# Patient Record
Sex: Female | Born: 1955 | Race: White | Hispanic: No | Smoking: Current every day smoker
Health system: Southern US, Community
[De-identification: ages and names within clinical notes are randomized; demographics above are authoritative.]

## PROBLEM LIST (undated history)

## (undated) DIAGNOSIS — M549 Dorsalgia, unspecified: Secondary | ICD-10-CM

## (undated) DIAGNOSIS — N189 Chronic kidney disease, unspecified: Secondary | ICD-10-CM

## (undated) DIAGNOSIS — T7840XA Allergy, unspecified, initial encounter: Secondary | ICD-10-CM

## (undated) DIAGNOSIS — D649 Anemia, unspecified: Secondary | ICD-10-CM

## (undated) DIAGNOSIS — J449 Chronic obstructive pulmonary disease, unspecified: Secondary | ICD-10-CM

## (undated) DIAGNOSIS — F32A Depression, unspecified: Secondary | ICD-10-CM

## (undated) DIAGNOSIS — N393 Stress incontinence (female) (male): Secondary | ICD-10-CM

## (undated) DIAGNOSIS — E039 Hypothyroidism, unspecified: Secondary | ICD-10-CM

## (undated) DIAGNOSIS — K5790 Diverticulosis of intestine, part unspecified, without perforation or abscess without bleeding: Secondary | ICD-10-CM

## (undated) DIAGNOSIS — E785 Hyperlipidemia, unspecified: Secondary | ICD-10-CM

## (undated) DIAGNOSIS — E119 Type 2 diabetes mellitus without complications: Secondary | ICD-10-CM

## (undated) DIAGNOSIS — G8929 Other chronic pain: Secondary | ICD-10-CM

## (undated) DIAGNOSIS — R0602 Shortness of breath: Secondary | ICD-10-CM

## (undated) DIAGNOSIS — F419 Anxiety disorder, unspecified: Secondary | ICD-10-CM

## (undated) DIAGNOSIS — Z8719 Personal history of other diseases of the digestive system: Secondary | ICD-10-CM

## (undated) DIAGNOSIS — M2550 Pain in unspecified joint: Secondary | ICD-10-CM

## (undated) DIAGNOSIS — K219 Gastro-esophageal reflux disease without esophagitis: Secondary | ICD-10-CM

## (undated) DIAGNOSIS — K469 Unspecified abdominal hernia without obstruction or gangrene: Secondary | ICD-10-CM

## (undated) DIAGNOSIS — R238 Other skin changes: Secondary | ICD-10-CM

## (undated) DIAGNOSIS — R233 Spontaneous ecchymoses: Secondary | ICD-10-CM

## (undated) DIAGNOSIS — I10 Essential (primary) hypertension: Secondary | ICD-10-CM

## (undated) DIAGNOSIS — D242 Benign neoplasm of left breast: Secondary | ICD-10-CM

## (undated) DIAGNOSIS — G4700 Insomnia, unspecified: Secondary | ICD-10-CM

## (undated) DIAGNOSIS — R39198 Other difficulties with micturition: Secondary | ICD-10-CM

## (undated) DIAGNOSIS — R7303 Prediabetes: Secondary | ICD-10-CM

## (undated) DIAGNOSIS — I2510 Atherosclerotic heart disease of native coronary artery without angina pectoris: Secondary | ICD-10-CM

## (undated) DIAGNOSIS — F329 Major depressive disorder, single episode, unspecified: Secondary | ICD-10-CM

## (undated) DIAGNOSIS — L409 Psoriasis, unspecified: Secondary | ICD-10-CM

## (undated) DIAGNOSIS — M1990 Unspecified osteoarthritis, unspecified site: Secondary | ICD-10-CM

## (undated) DIAGNOSIS — I219 Acute myocardial infarction, unspecified: Secondary | ICD-10-CM

## (undated) DIAGNOSIS — J189 Pneumonia, unspecified organism: Secondary | ICD-10-CM

## (undated) DIAGNOSIS — N750 Cyst of Bartholin's gland: Secondary | ICD-10-CM

## (undated) HISTORY — DX: Anemia, unspecified: D64.9

## (undated) HISTORY — PX: CORONARY ANGIOPLASTY WITH STENT PLACEMENT: SHX49

## (undated) HISTORY — DX: Allergy, unspecified, initial encounter: T78.40XA

## (undated) HISTORY — PX: DILATION AND CURETTAGE OF UTERUS: SHX78

## (undated) HISTORY — DX: Chronic kidney disease, unspecified: N18.9

## (undated) HISTORY — DX: Cyst of Bartholin's gland: N75.0

## (undated) HISTORY — PX: UPPER GASTROINTESTINAL ENDOSCOPY: SHX188

## (undated) HISTORY — PX: TONSILLECTOMY: SUR1361

## (undated) HISTORY — PX: BACK SURGERY: SHX140

## (undated) HISTORY — DX: Benign neoplasm of left breast: D24.2

## (undated) HISTORY — PX: ESOPHAGOGASTRODUODENOSCOPY: SHX1529

---

## 1975-07-16 HISTORY — PX: ABDOMINAL EXPLORATION SURGERY: SHX538

## 1975-07-16 HISTORY — PX: ABDOMINAL HYSTERECTOMY: SHX81

## 1998-02-18 ENCOUNTER — Observation Stay (HOSPITAL_COMMUNITY): Admission: EM | Admit: 1998-02-18 | Discharge: 1998-02-20 | Payer: Self-pay | Admitting: Emergency Medicine

## 1998-03-13 ENCOUNTER — Encounter: Admission: RE | Admit: 1998-03-13 | Discharge: 1998-03-13 | Payer: Self-pay | Admitting: Internal Medicine

## 1998-04-13 ENCOUNTER — Encounter: Admission: RE | Admit: 1998-04-13 | Discharge: 1998-04-13 | Payer: Self-pay | Admitting: Internal Medicine

## 1998-04-28 ENCOUNTER — Encounter: Admission: RE | Admit: 1998-04-28 | Discharge: 1998-04-28 | Payer: Self-pay | Admitting: Internal Medicine

## 1998-07-20 ENCOUNTER — Encounter: Admission: RE | Admit: 1998-07-20 | Discharge: 1998-07-20 | Payer: Self-pay | Admitting: Hematology and Oncology

## 1998-08-21 ENCOUNTER — Encounter: Admission: RE | Admit: 1998-08-21 | Discharge: 1998-08-21 | Payer: Self-pay | Admitting: Internal Medicine

## 1998-10-02 ENCOUNTER — Encounter: Admission: RE | Admit: 1998-10-02 | Discharge: 1998-10-02 | Payer: Self-pay | Admitting: Internal Medicine

## 1999-01-01 ENCOUNTER — Encounter: Admission: RE | Admit: 1999-01-01 | Discharge: 1999-01-01 | Payer: Self-pay | Admitting: Internal Medicine

## 2000-02-07 ENCOUNTER — Ambulatory Visit (HOSPITAL_COMMUNITY): Admission: RE | Admit: 2000-02-07 | Discharge: 2000-02-07 | Payer: Self-pay

## 2002-06-29 ENCOUNTER — Emergency Department (HOSPITAL_COMMUNITY): Admission: EM | Admit: 2002-06-29 | Discharge: 2002-06-29 | Payer: Self-pay | Admitting: Emergency Medicine

## 2002-12-09 ENCOUNTER — Emergency Department (HOSPITAL_COMMUNITY): Admission: EM | Admit: 2002-12-09 | Discharge: 2002-12-09 | Payer: Self-pay | Admitting: Emergency Medicine

## 2002-12-09 ENCOUNTER — Encounter: Payer: Self-pay | Admitting: Emergency Medicine

## 2003-09-21 ENCOUNTER — Ambulatory Visit (HOSPITAL_COMMUNITY): Admission: RE | Admit: 2003-09-21 | Discharge: 2003-09-21 | Payer: Self-pay | Admitting: Internal Medicine

## 2003-09-28 ENCOUNTER — Ambulatory Visit (HOSPITAL_COMMUNITY): Admission: RE | Admit: 2003-09-28 | Discharge: 2003-09-28 | Payer: Self-pay | Admitting: Internal Medicine

## 2003-12-19 ENCOUNTER — Emergency Department (HOSPITAL_COMMUNITY): Admission: EM | Admit: 2003-12-19 | Discharge: 2003-12-20 | Payer: Self-pay | Admitting: Emergency Medicine

## 2004-05-27 ENCOUNTER — Emergency Department (HOSPITAL_COMMUNITY): Admission: EM | Admit: 2004-05-27 | Discharge: 2004-05-27 | Payer: Self-pay | Admitting: Emergency Medicine

## 2004-06-01 ENCOUNTER — Ambulatory Visit: Payer: Self-pay | Admitting: *Deleted

## 2004-06-14 ENCOUNTER — Encounter: Admission: RE | Admit: 2004-06-14 | Discharge: 2004-08-03 | Payer: Self-pay | Admitting: Orthopedic Surgery

## 2004-06-20 ENCOUNTER — Ambulatory Visit: Payer: Self-pay | Admitting: Internal Medicine

## 2004-06-20 ENCOUNTER — Ambulatory Visit: Payer: Self-pay | Admitting: Nurse Practitioner

## 2004-08-23 ENCOUNTER — Ambulatory Visit: Payer: Self-pay | Admitting: Nurse Practitioner

## 2004-09-24 ENCOUNTER — Ambulatory Visit: Payer: Self-pay | Admitting: Nurse Practitioner

## 2005-03-12 ENCOUNTER — Ambulatory Visit: Payer: Self-pay | Admitting: Nurse Practitioner

## 2005-05-09 ENCOUNTER — Ambulatory Visit: Payer: Self-pay | Admitting: Nurse Practitioner

## 2005-09-02 ENCOUNTER — Ambulatory Visit: Payer: Self-pay | Admitting: Nurse Practitioner

## 2005-12-26 ENCOUNTER — Ambulatory Visit: Payer: Self-pay | Admitting: *Deleted

## 2005-12-30 ENCOUNTER — Ambulatory Visit: Payer: Self-pay | Admitting: Nurse Practitioner

## 2006-02-07 ENCOUNTER — Ambulatory Visit: Payer: Self-pay | Admitting: Nurse Practitioner

## 2006-04-21 ENCOUNTER — Ambulatory Visit: Payer: Self-pay | Admitting: Nurse Practitioner

## 2006-06-04 ENCOUNTER — Ambulatory Visit: Payer: Self-pay | Admitting: Nurse Practitioner

## 2006-08-13 ENCOUNTER — Ambulatory Visit: Payer: Self-pay | Admitting: Nurse Practitioner

## 2006-10-06 ENCOUNTER — Emergency Department (HOSPITAL_COMMUNITY): Admission: EM | Admit: 2006-10-06 | Discharge: 2006-10-06 | Payer: Self-pay | Admitting: Emergency Medicine

## 2006-10-08 ENCOUNTER — Ambulatory Visit: Payer: Self-pay | Admitting: Nurse Practitioner

## 2006-10-21 ENCOUNTER — Ambulatory Visit (HOSPITAL_COMMUNITY): Admission: RE | Admit: 2006-10-21 | Discharge: 2006-10-21 | Payer: Self-pay | Admitting: Family Medicine

## 2006-11-03 ENCOUNTER — Ambulatory Visit: Payer: Self-pay | Admitting: Nurse Practitioner

## 2006-11-11 ENCOUNTER — Ambulatory Visit: Payer: Self-pay | Admitting: Internal Medicine

## 2006-11-24 ENCOUNTER — Ambulatory Visit: Payer: Self-pay | Admitting: Internal Medicine

## 2006-12-26 ENCOUNTER — Ambulatory Visit: Payer: Self-pay | Admitting: Internal Medicine

## 2007-01-02 ENCOUNTER — Ambulatory Visit (HOSPITAL_COMMUNITY): Admission: RE | Admit: 2007-01-02 | Discharge: 2007-01-02 | Payer: Self-pay | Admitting: Family Medicine

## 2007-01-27 ENCOUNTER — Emergency Department (HOSPITAL_COMMUNITY): Admission: EM | Admit: 2007-01-27 | Discharge: 2007-01-27 | Payer: Self-pay | Admitting: Emergency Medicine

## 2007-03-11 ENCOUNTER — Ambulatory Visit: Payer: Self-pay | Admitting: Internal Medicine

## 2007-03-25 ENCOUNTER — Ambulatory Visit: Payer: Self-pay | Admitting: Internal Medicine

## 2007-04-01 ENCOUNTER — Encounter (INDEPENDENT_AMBULATORY_CARE_PROVIDER_SITE_OTHER): Payer: Self-pay | Admitting: *Deleted

## 2007-04-09 ENCOUNTER — Ambulatory Visit: Payer: Self-pay | Admitting: Internal Medicine

## 2007-05-13 ENCOUNTER — Ambulatory Visit: Payer: Self-pay | Admitting: Nurse Practitioner

## 2007-05-13 LAB — CONVERTED CEMR LAB
AST: 14 units/L (ref 0–37)
Alkaline Phosphatase: 85 units/L (ref 39–117)
CO2: 23 meq/L (ref 19–32)
Chloride: 105 meq/L (ref 96–112)
Glucose, Bld: 79 mg/dL (ref 70–99)
Potassium: 4.6 meq/L (ref 3.5–5.3)
Sodium: 140 meq/L (ref 135–145)

## 2007-06-08 ENCOUNTER — Ambulatory Visit: Payer: Self-pay | Admitting: Family Medicine

## 2007-08-24 ENCOUNTER — Ambulatory Visit: Payer: Self-pay | Admitting: Internal Medicine

## 2007-10-06 ENCOUNTER — Ambulatory Visit: Payer: Self-pay | Admitting: Family Medicine

## 2007-11-16 ENCOUNTER — Ambulatory Visit: Payer: Self-pay | Admitting: Family Medicine

## 2007-12-21 ENCOUNTER — Ambulatory Visit: Payer: Self-pay | Admitting: Internal Medicine

## 2008-03-09 ENCOUNTER — Ambulatory Visit: Payer: Self-pay | Admitting: Internal Medicine

## 2008-03-28 ENCOUNTER — Ambulatory Visit (HOSPITAL_COMMUNITY): Admission: RE | Admit: 2008-03-28 | Discharge: 2008-03-28 | Payer: Self-pay | Admitting: Internal Medicine

## 2008-03-28 ENCOUNTER — Encounter (INDEPENDENT_AMBULATORY_CARE_PROVIDER_SITE_OTHER): Payer: Self-pay | Admitting: Family Medicine

## 2008-03-28 ENCOUNTER — Ambulatory Visit: Payer: Self-pay | Admitting: Internal Medicine

## 2008-03-28 LAB — CONVERTED CEMR LAB
Eosinophils Relative: 3 % (ref 0–5)
Hemoglobin: 15 g/dL (ref 12.0–15.0)
MCHC: 31.4 g/dL (ref 30.0–36.0)
MCV: 96.8 fL (ref 78.0–100.0)
Monocytes Relative: 10 % (ref 3–12)
Neutro Abs: 5.1 10*3/uL (ref 1.7–7.7)
Platelets: 422 10*3/uL — ABNORMAL HIGH (ref 150–400)

## 2008-04-05 ENCOUNTER — Ambulatory Visit (HOSPITAL_COMMUNITY): Admission: RE | Admit: 2008-04-05 | Discharge: 2008-04-05 | Payer: Self-pay | Admitting: Internal Medicine

## 2008-05-04 ENCOUNTER — Ambulatory Visit: Payer: Self-pay | Admitting: Internal Medicine

## 2008-05-12 ENCOUNTER — Encounter (INDEPENDENT_AMBULATORY_CARE_PROVIDER_SITE_OTHER): Payer: Self-pay | Admitting: Internal Medicine

## 2008-05-12 ENCOUNTER — Ambulatory Visit: Payer: Self-pay | Admitting: Internal Medicine

## 2008-05-12 LAB — CONVERTED CEMR LAB
Albumin: 4.1 g/dL (ref 3.5–5.2)
Alkaline Phosphatase: 82 units/L (ref 39–117)
Amphetamine Screen, Ur: NEGATIVE
Barbiturate Quant, Ur: NEGATIVE
Benzodiazepines.: NEGATIVE
Cocaine Metabolites: NEGATIVE
Creatinine, Ser: 0.75 mg/dL (ref 0.40–1.20)
Eosinophils Absolute: 0.2 10*3/uL (ref 0.0–0.7)
Eosinophils Relative: 2 % (ref 0–5)
HDL: 69 mg/dL (ref >39)
LDL Cholesterol: 118 mg/dL — ABNORMAL HIGH (ref 0–99)
Lymphocytes Relative: 32 % (ref 12–46)
Lymphs Abs: 2.2 10*3/uL (ref 0.7–4.0)
MCHC: 31.5 g/dL (ref 30.0–36.0)
MCV: 95.8 fL (ref 78.0–100.0)
Marijuana Metabolite: NEGATIVE
Methadone: NEGATIVE
Monocytes Absolute: 0.6 10*3/uL (ref 0.1–1.0)
Monocytes Relative: 9 % (ref 3–12)
Neutrophils Relative %: 56 % (ref 43–77)
Opiate Screen, Urine: NEGATIVE
Phencyclidine (PCP): NEGATIVE
Propoxyphene: NEGATIVE
RDW: 14 % (ref 11.5–15.5)
TSH: 1.956 microintl units/mL (ref 0.350–4.50)
Total CHOL/HDL Ratio: 2.9
WBC: 6.7 10*3/uL (ref 4.0–10.5)

## 2008-08-04 ENCOUNTER — Ambulatory Visit: Payer: Self-pay | Admitting: Internal Medicine

## 2008-09-01 ENCOUNTER — Ambulatory Visit: Payer: Self-pay | Admitting: Internal Medicine

## 2008-09-01 ENCOUNTER — Encounter (INDEPENDENT_AMBULATORY_CARE_PROVIDER_SITE_OTHER): Payer: Self-pay | Admitting: Internal Medicine

## 2008-09-01 LAB — CONVERTED CEMR LAB
HDL: 87 mg/dL (ref >39)
Triglycerides: 52 mg/dL (ref <150)

## 2008-09-12 ENCOUNTER — Emergency Department (HOSPITAL_COMMUNITY): Admission: EM | Admit: 2008-09-12 | Discharge: 2008-09-12 | Payer: Self-pay | Admitting: Emergency Medicine

## 2008-10-03 ENCOUNTER — Ambulatory Visit: Payer: Self-pay | Admitting: Family Medicine

## 2008-11-21 ENCOUNTER — Ambulatory Visit: Payer: Self-pay | Admitting: Internal Medicine

## 2008-11-28 ENCOUNTER — Ambulatory Visit: Payer: Self-pay | Admitting: Internal Medicine

## 2008-12-09 ENCOUNTER — Emergency Department (HOSPITAL_COMMUNITY): Admission: EM | Admit: 2008-12-09 | Discharge: 2008-12-09 | Payer: Self-pay | Admitting: Family Medicine

## 2008-12-22 ENCOUNTER — Ambulatory Visit: Payer: Self-pay | Admitting: Internal Medicine

## 2008-12-22 ENCOUNTER — Encounter: Payer: Self-pay | Admitting: Internal Medicine

## 2008-12-22 DIAGNOSIS — F329 Major depressive disorder, single episode, unspecified: Secondary | ICD-10-CM | POA: Insufficient documentation

## 2008-12-22 DIAGNOSIS — R05 Cough: Secondary | ICD-10-CM | POA: Insufficient documentation

## 2008-12-22 DIAGNOSIS — J45909 Unspecified asthma, uncomplicated: Secondary | ICD-10-CM | POA: Insufficient documentation

## 2008-12-22 DIAGNOSIS — E039 Hypothyroidism, unspecified: Secondary | ICD-10-CM | POA: Insufficient documentation

## 2008-12-22 DIAGNOSIS — F32A Depression, unspecified: Secondary | ICD-10-CM | POA: Insufficient documentation

## 2009-03-13 ENCOUNTER — Emergency Department (HOSPITAL_COMMUNITY): Admission: EM | Admit: 2009-03-13 | Discharge: 2009-03-13 | Payer: Self-pay | Admitting: Family Medicine

## 2009-05-22 ENCOUNTER — Encounter (INDEPENDENT_AMBULATORY_CARE_PROVIDER_SITE_OTHER): Payer: Self-pay | Admitting: Internal Medicine

## 2009-05-22 ENCOUNTER — Ambulatory Visit: Payer: Self-pay | Admitting: Internal Medicine

## 2009-05-31 ENCOUNTER — Encounter: Payer: Self-pay | Admitting: Internal Medicine

## 2009-05-31 ENCOUNTER — Ambulatory Visit (HOSPITAL_COMMUNITY): Admission: RE | Admit: 2009-05-31 | Discharge: 2009-05-31 | Payer: Self-pay | Admitting: Internal Medicine

## 2009-07-12 ENCOUNTER — Emergency Department (HOSPITAL_COMMUNITY): Admission: EM | Admit: 2009-07-12 | Discharge: 2009-07-13 | Payer: Self-pay | Admitting: Emergency Medicine

## 2009-07-15 DIAGNOSIS — I219 Acute myocardial infarction, unspecified: Secondary | ICD-10-CM

## 2009-07-15 HISTORY — PX: COLONOSCOPY: SHX174

## 2009-07-15 HISTORY — DX: Acute myocardial infarction, unspecified: I21.9

## 2009-07-16 ENCOUNTER — Emergency Department (HOSPITAL_COMMUNITY): Admission: EM | Admit: 2009-07-16 | Discharge: 2009-07-17 | Payer: Self-pay | Admitting: Emergency Medicine

## 2009-07-17 ENCOUNTER — Ambulatory Visit: Payer: Self-pay | Admitting: Internal Medicine

## 2009-07-17 DIAGNOSIS — F17200 Nicotine dependence, unspecified, uncomplicated: Secondary | ICD-10-CM | POA: Insufficient documentation

## 2009-07-17 DIAGNOSIS — R911 Solitary pulmonary nodule: Secondary | ICD-10-CM | POA: Insufficient documentation

## 2009-07-17 DIAGNOSIS — L272 Dermatitis due to ingested food: Secondary | ICD-10-CM | POA: Insufficient documentation

## 2009-07-17 DIAGNOSIS — J310 Chronic rhinitis: Secondary | ICD-10-CM | POA: Insufficient documentation

## 2009-07-21 ENCOUNTER — Telehealth: Payer: Self-pay | Admitting: Internal Medicine

## 2009-09-05 ENCOUNTER — Ambulatory Visit: Payer: Self-pay | Admitting: Internal Medicine

## 2009-09-12 ENCOUNTER — Emergency Department (HOSPITAL_COMMUNITY): Admission: EM | Admit: 2009-09-12 | Discharge: 2009-09-12 | Payer: Self-pay | Admitting: Family Medicine

## 2009-09-15 ENCOUNTER — Ambulatory Visit: Payer: Self-pay | Admitting: Internal Medicine

## 2009-09-15 DIAGNOSIS — J441 Chronic obstructive pulmonary disease with (acute) exacerbation: Secondary | ICD-10-CM

## 2009-10-05 ENCOUNTER — Encounter: Admission: RE | Admit: 2009-10-05 | Discharge: 2009-11-09 | Payer: Self-pay | Admitting: Family Medicine

## 2009-11-06 ENCOUNTER — Ambulatory Visit: Payer: Self-pay | Admitting: Adult Health

## 2009-11-06 ENCOUNTER — Telehealth (INDEPENDENT_AMBULATORY_CARE_PROVIDER_SITE_OTHER): Payer: Self-pay | Admitting: *Deleted

## 2009-11-06 ENCOUNTER — Inpatient Hospital Stay (HOSPITAL_COMMUNITY): Admission: EM | Admit: 2009-11-06 | Discharge: 2009-11-10 | Payer: Self-pay | Admitting: Emergency Medicine

## 2009-11-22 ENCOUNTER — Ambulatory Visit: Payer: Self-pay | Admitting: Family Medicine

## 2009-11-22 ENCOUNTER — Encounter (INDEPENDENT_AMBULATORY_CARE_PROVIDER_SITE_OTHER): Payer: Self-pay | Admitting: Adult Health

## 2009-11-22 LAB — CONVERTED CEMR LAB
AST: 14 units/L (ref 0–37)
Alkaline Phosphatase: 97 units/L (ref 39–117)
Basophils Absolute: 0.1 10*3/uL (ref 0.0–0.1)
Basophils Relative: 1 % (ref 0–1)
CO2: 23 meq/L (ref 19–32)
Chloride: 102 meq/L (ref 96–112)
Eosinophils Absolute: 0.3 10*3/uL (ref 0.0–0.7)
Glucose, Bld: 91 mg/dL (ref 70–99)
Hemoglobin: 12.7 g/dL (ref 12.0–15.0)
Microalb, Ur: 0.5 mg/dL (ref 0.00–1.89)
Neutro Abs: 5.1 10*3/uL (ref 1.7–7.7)
Neutrophils Relative %: 56 % (ref 43–77)
Potassium: 4.7 meq/L (ref 3.5–5.3)
RBC: 4.09 M/uL (ref 3.87–5.11)
Sodium: 138 meq/L (ref 135–145)
Total Bilirubin: 0.3 mg/dL (ref 0.3–1.2)

## 2009-11-23 ENCOUNTER — Encounter (INDEPENDENT_AMBULATORY_CARE_PROVIDER_SITE_OTHER): Payer: Self-pay | Admitting: Adult Health

## 2009-11-23 LAB — CONVERTED CEMR LAB: Free T4: 1.11 ng/dL (ref 0.80–1.80)

## 2009-12-07 ENCOUNTER — Encounter (HOSPITAL_COMMUNITY): Admission: RE | Admit: 2009-12-07 | Discharge: 2010-03-07 | Payer: Self-pay | Admitting: Cardiovascular Disease

## 2010-01-08 ENCOUNTER — Ambulatory Visit: Payer: Self-pay | Admitting: Internal Medicine

## 2010-01-11 ENCOUNTER — Encounter (INDEPENDENT_AMBULATORY_CARE_PROVIDER_SITE_OTHER): Payer: Self-pay | Admitting: Adult Health

## 2010-01-11 ENCOUNTER — Ambulatory Visit: Payer: Self-pay | Admitting: Family Medicine

## 2010-01-11 LAB — CONVERTED CEMR LAB
Amphetamine Screen, Ur: NEGATIVE
Basophils Absolute: 0.1 10*3/uL (ref 0.0–0.1)
Cocaine Metabolites: NEGATIVE
Creatinine,U: 61.7 mg/dL
Eosinophils Absolute: 0.3 10*3/uL (ref 0.0–0.7)
Eosinophils Relative: 4 % (ref 0–5)
HCT: 41.5 % (ref 36.0–46.0)
Marijuana Metabolite: NEGATIVE
Monocytes Absolute: 0.9 10*3/uL (ref 0.1–1.0)
Monocytes Relative: 10 % (ref 3–12)
Phencyclidine (PCP): NEGATIVE
RBC: 4.46 M/uL (ref 3.87–5.11)
RDW: 13.5 % (ref 11.5–15.5)
WBC: 8.9 10*3/uL (ref 4.0–10.5)

## 2010-01-18 ENCOUNTER — Encounter (INDEPENDENT_AMBULATORY_CARE_PROVIDER_SITE_OTHER): Payer: Self-pay | Admitting: *Deleted

## 2010-01-18 ENCOUNTER — Ambulatory Visit: Payer: Self-pay | Admitting: Internal Medicine

## 2010-01-18 ENCOUNTER — Ambulatory Visit (HOSPITAL_COMMUNITY): Admission: RE | Admit: 2010-01-18 | Discharge: 2010-01-18 | Payer: Self-pay | Admitting: Family Medicine

## 2010-01-26 ENCOUNTER — Inpatient Hospital Stay (HOSPITAL_COMMUNITY): Admission: EM | Admit: 2010-01-26 | Discharge: 2010-01-31 | Payer: Self-pay | Admitting: Emergency Medicine

## 2010-01-31 ENCOUNTER — Encounter (INDEPENDENT_AMBULATORY_CARE_PROVIDER_SITE_OTHER): Payer: Self-pay | Admitting: *Deleted

## 2010-02-01 ENCOUNTER — Encounter (INDEPENDENT_AMBULATORY_CARE_PROVIDER_SITE_OTHER): Payer: Self-pay | Admitting: Adult Health

## 2010-02-01 ENCOUNTER — Ambulatory Visit: Payer: Self-pay | Admitting: Internal Medicine

## 2010-02-01 LAB — CONVERTED CEMR LAB
Basophils Absolute: 0 10*3/uL (ref 0.0–0.1)
HCT: 39.8 % (ref 36.0–46.0)
Hemoglobin: 13 g/dL (ref 12.0–15.0)
Platelets: 399 10*3/uL (ref 150–400)
RDW: 14.4 % (ref 11.5–15.5)

## 2010-02-13 ENCOUNTER — Encounter (INDEPENDENT_AMBULATORY_CARE_PROVIDER_SITE_OTHER): Payer: Self-pay | Admitting: *Deleted

## 2010-02-13 ENCOUNTER — Ambulatory Visit: Payer: Self-pay | Admitting: Internal Medicine

## 2010-03-02 ENCOUNTER — Emergency Department (HOSPITAL_COMMUNITY): Admission: EM | Admit: 2010-03-02 | Discharge: 2010-03-02 | Payer: Self-pay | Admitting: Family Medicine

## 2010-03-08 ENCOUNTER — Encounter (HOSPITAL_COMMUNITY): Admission: RE | Admit: 2010-03-08 | Discharge: 2010-04-13 | Payer: Self-pay | Admitting: Cardiovascular Disease

## 2010-03-28 ENCOUNTER — Ambulatory Visit: Payer: Self-pay | Admitting: Gastroenterology

## 2010-03-28 ENCOUNTER — Encounter (INDEPENDENT_AMBULATORY_CARE_PROVIDER_SITE_OTHER): Payer: Self-pay | Admitting: *Deleted

## 2010-03-28 DIAGNOSIS — R1032 Left lower quadrant pain: Secondary | ICD-10-CM | POA: Insufficient documentation

## 2010-03-28 DIAGNOSIS — R933 Abnormal findings on diagnostic imaging of other parts of digestive tract: Secondary | ICD-10-CM | POA: Insufficient documentation

## 2010-03-29 LAB — CONVERTED CEMR LAB
BUN: 17 mg/dL (ref 6–23)
Basophils Absolute: 0.1 10*3/uL (ref 0.0–0.1)
CO2: 29 meq/L (ref 19–32)
Calcium: 9.9 mg/dL (ref 8.4–10.5)
Eosinophils Absolute: 0.3 10*3/uL (ref 0.0–0.7)
Eosinophils Relative: 2.8 % (ref 0.0–5.0)
GFR calc non Af Amer: 89.79 mL/min (ref >60)
Lymphs Abs: 3.3 10*3/uL (ref 0.7–4.0)
MCHC: 34.2 g/dL (ref 30.0–36.0)
MCV: 94.1 fL (ref 78.0–100.0)
Monocytes Absolute: 0.8 10*3/uL (ref 0.1–1.0)
Neutro Abs: 4.5 10*3/uL (ref 1.4–7.7)
Neutrophils Relative %: 49.8 % (ref 43.0–77.0)
Platelets: 363 10*3/uL (ref 150.0–400.0)
Potassium: 4.4 meq/L (ref 3.5–5.1)
RBC: 4.23 M/uL (ref 3.87–5.11)
Sodium: 140 meq/L (ref 135–145)
WBC: 9 10*3/uL (ref 4.5–10.5)

## 2010-04-09 ENCOUNTER — Telehealth (INDEPENDENT_AMBULATORY_CARE_PROVIDER_SITE_OTHER): Payer: Self-pay | Admitting: *Deleted

## 2010-04-14 ENCOUNTER — Encounter (HOSPITAL_COMMUNITY)
Admission: RE | Admit: 2010-04-14 | Discharge: 2010-07-13 | Payer: Self-pay | Source: Home / Self Care | Attending: Cardiovascular Disease | Admitting: Cardiovascular Disease

## 2010-04-19 ENCOUNTER — Ambulatory Visit: Payer: Self-pay | Admitting: Gastroenterology

## 2010-04-19 ENCOUNTER — Ambulatory Visit (HOSPITAL_COMMUNITY): Admission: RE | Admit: 2010-04-19 | Discharge: 2010-04-19 | Payer: Self-pay | Admitting: Gastroenterology

## 2010-07-20 ENCOUNTER — Encounter: Payer: Self-pay | Admitting: Internal Medicine

## 2010-07-24 ENCOUNTER — Ambulatory Visit (HOSPITAL_COMMUNITY)
Admission: RE | Admit: 2010-07-24 | Discharge: 2010-07-24 | Payer: Self-pay | Source: Home / Self Care | Attending: Internal Medicine | Admitting: Internal Medicine

## 2010-08-14 NOTE — Discharge Summary (Signed)
Summary: discharge    NAME:  [name] [name], [name] [name]                 ACCOUNT NO.:  [id number]      MEDICAL RECORD NO.:  [id number]          PATIENT TYPE:  INP      LOCATION:  2501                         FACILITY:  [facility]      PHYSICIAN:  Nicki Guadalajara, M.D.     DATE OF BIRTH:  03/26/1956      DATE OF ADMISSION:  01/26/2010   DATE OF DISCHARGE:  01/31/2010                                  DISCHARGE SUMMARY      DISCHARGE DIAGNOSES:   1. Unstable angina.   2. Coronary artery disease with previous non-ST elevation myocardial       infarction in May 2011, receiving stents to the right coronary       artery and the left anterior descending artery.       a.     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   3. Episodic chest pain with gastrointestinal component.   4. Asthmatic chronic obstructive pulmonary disease.   5. Allergic rhinitis.   6. Gastroesophageal reflux disease.   7. Treated and controlled hypothyroidism.   8. History of depression.   9. Dyslipidemia.   10.Tobacco use on NicoDerm patch and lozenges.   11.Right radial catheterization with some discomfort, it resolved at       discharge.      DISCHARGE CONDITION:  Improved.      PROCEDURES:   1. Combined left heart cath radially, January 29, 2010, by Dr. [place]       Hilty.   2. January 29, 2010, PTCA and stent deployment within the stent to the       RCA with drug-eluting stents by Dr. Nanetta Batty.      HOSPITAL COURSE:  The patient was admitted after going to cardiac rehab   and complaining of chest discomfort that she had had the day before.   Nitroglycerin seemed to ease the discomfort.  It was indigestion-type   pain, but this was the same pain prior to her non-STEMI in May 2011.   She has some discomfort during rehab, but when in  the ER, she was pain   free.  She was admitted to telemetry unit.  There on the floor, she   started having chest discomfort again as she was being taken from the ER   to the 2000 unit and was put on IV nitro drip as well as her   anticoagulation.  She was monitored over the weekend and by Monday   morning, underwent cardiac catheterization with results as previously   stated.  By January 30, 2010, she after ambulating in the hall complained   of more chest discomfort similar to prior chest discomfort.  She was put   back on nitro and heparin.  Her troponin was 0.10, but this was  no   significant change since she had just had her stents placed.  By the   next morning, she was stable, the nitro was discontinued, she was placed   on Imdur and her heparin was discontinued.  She had no further   complaints, was discharged home, will follow up with Dr. Tresa Endo as an   outpatient.  There was a question if the diagonal needed intervention.   Dr. Tresa Endo will decide this as an outpatient depending on her symptoms.      The patient continues to smoke.  Tobacco Cessation did talk to her at   length, recommended using the lozenges instead of cigarettes in addition   to her patch.  Imdur was added to her medical regimen as stated and her   Plavix was discontinued and she is now on Effient and assistance was   mailed and 2 prescriptions were written for this at discharge.      PHYSICAL EXAMINATION:  VITAL SIGNS AT DISCHARGE:  Blood pressure 127/58,   pulse 62, respiratory rate 16, temperature 98.3, oxygen saturation 97%.   HEART:  Regular rate and rhythm.   LUNGS:  Clear except a few wheezes.   ABDOMEN:  Obese, soft, nontender.  Right radial stable.      Cardiac enzymes at discharge; CK-MB 70, MB 1.4, troponin I 0.06, the   peak during the whole hospitalization was after the stent and was 0.10,   otherwise negative CK-MBs.  Sodium 140, potassium 4.0, BUN 13,   creatinine 0.70, and glucose 132 at  discharge.  Hemoglobin 12.5,   hematocrit 37.6, platelets 325, and WBC 7.3.      EKGs, sinus rhythm with nonspecific ST changes.  Chest x-ray on   admission; no active lung disease, borderline cardiomegaly.  Stool for   blood was negative.      Lipid panel; total cholesterol 139, triglycerides 54, HDL 64, LDL 64.      Drug screen was negative for cocaine.  In the past, she has used   cocaine, but stopped when she had her MI previously.               [name]. [name], N.P.         ______________________________   Nicki Guadalajara, M.D.         LRI/MEDQ  D:  01/31/2010  T:  01/31/2010  Job:  [id number]      cc:   Dala Dock      Electronically Signed by Nada Boozer N.P. on 01/31/2010 04:01:42 PM   Electronically Signed by Nicki Guadalajara M.D. on 02/06/2010 03:21:53 PM

## 2010-08-14 NOTE — Progress Notes (Signed)
Summary: triage/chest pressure  Phone Note Call from Patient   Caller: Patient Reason for Call: Talk to Nurse Summary of Call: patient states she started feeling bad on Friday 11/03/09.Marland KitchenHasn't been around anyone sick...started feeling pressure in her chest...patient has a history of hiatal hernia. She takes protonix and has been chewing tums.  Her stomach was upset Saturday night and she had diarrhea from 7pm to 4am Sunday morning..She has a family history of heart disease.Marland KitchenMarland KitchenBoth mother and father had open heart surgery...when asked about numbness in arms or hands..she says she woke up and "right" hand was numb. Advised patient to go to ED based on chest pressure and family history...we have an afternoon appointment and she would rather take that.Marland KitchenMarland KitchenPatient states understanding to call 911 should any s/s become worse before her appointment this afternoon... Initial call taken by: Conchita Paris,  November 06, 2009 12:45 PM

## 2010-08-14 NOTE — Letter (Signed)
Summary: New Patient letter  [facility]  [address] [place], [place] [address]   Phone: [phone]  Fax: [phone]       02/13/2010 MRN: [id number]  [facility] [address] [place], [place]  [address]  Dear [name] [name],  Welcome to the Gastroenterology Division at [facility].    You are scheduled to see Dr.  Christella Hartigan on 03-28-10 at 3:30p.m. on the 3rd floor at [facility], [address].  We ask that you try to arrive at our office 15 minutes prior to your appointment time to allow for check-in.  We would like you to complete the enclosed self-administered evaluation form prior to your visit and bring it with you on the day of your appointment.  We will review it with you.  Also, please bring a complete list of all your medications or, if you prefer, bring the medication bottles and we will list them.  Please bring your insurance card so that we may make a copy of it.  If your insurance requires a referral to see a specialist, please bring your referral form from your primary care physician.  Co-payments are due at the time of your visit and may be paid by cash, check or credit card.     Your office visit will consist of a consult with your physician (includes a physical exam), any laboratory testing he/she may order, scheduling of any necessary diagnostic testing (e.g. x-ray, ultrasound, CT-scan), and scheduling of a procedure (e.g. Endoscopy, Colonoscopy) if required.  Please allow enough time on your schedule to allow for any/all of these possibilities.    If you cannot keep your appointment, please call [phone] to cancel or reschedule prior to your appointment date.  This allows [place] the opportunity to schedule an appointment for another patient in need of care.  If you do not cancel or reschedule by 5 p.m. the business day prior to your appointment date, you will be charged a $50.00 late cancellation/no-show fee.    Thank you for choosing  [facility] for your medical needs.  We appreciate the opportunity to care for you.  Please visit [place] at our website  to learn more about our practice.                     Sincerely,                                                             The Gastroenterology Division

## 2010-08-14 NOTE — Miscellaneous (Signed)
Summary: Lec previsit  Clinical Lists Changes  Medications: Added new medication of MOVIPREP 100 GM  SOLR (PEG-KCL-NACL-NASULF-NA ASC-C) As per prep instructions. - Signed Rx of MOVIPREP 100 GM  SOLR (PEG-KCL-NACL-NASULF-NA ASC-C) As per prep instructions.;  #1 x 0;  Signed;  Entered by: Ulis Rias RN;  Authorized by: Hart Carwin MD;  Method used: Electronically to [facility]  [phone]*, [address], [place], [place], [place]  [address], Ph: [phone] or [phone], Fax: [phone] Observations: Added new observation of ALLERGY REV: Done (02/13/2010 14:28)    Prescriptions: MOVIPREP 100 GM  SOLR (PEG-KCL-NACL-NASULF-NA ASC-C) As per prep instructions.  #1 x 0   Entered by:   Ulis Rias RN   Authorized by:   Hart Carwin MD   Signed by:   Ulis Rias RN on 02/13/2010   Method used:   Electronically to        [facility]  [phone]* (retail)       [address]       [place], [place]  [address]       Ph: [phone] or [phone]       Fax: [phone]   RxID:   [id number]   Appended Document: Lec previsit Pt came into office today for a previsit prior to her colonoscopy with Dr Juanda Chance on 02/27/10.After we discussed  with the  pt her meds, being on the blood thinner  "Effient" and having a heart attack in April 2011 and having  6-7 stents ( 2 new stents in July) .Pt  was advised she should see the doctor in the office first. Pt agreed with this decision and was scheduled to see Dr Christella Hartigan on 03/28/10 at 3:30pm. We will have the pt on the waiting list to be worked in earlier if possible. Pt will call back if she has further questions.

## 2010-08-14 NOTE — Procedures (Signed)
Summary: Colon Prep  Colon Prep   Imported By: Lester [place] 04/19/2010 09:00:10  _____________________________________________________________________  External Attachment:    Type:   Image     Comment:   External Document

## 2010-08-14 NOTE — Progress Notes (Signed)
Summary: CT NAD, small nodule RML.  Phone Note Call from Patient   CallerLowella Dandy [phone]  [phone] Call For: YOUNG Summary of Call: HAVE WE RECIEVED REPORT ON CT FROM [facility] TO COMPARE TO Initial call taken by: Lacinda Axon,  July 21, 2009 11:36 AM  Follow-up for Phone Call        have you received this report yet. Carron Curie CMA  July 21, 2009 11:39 AM   The only one available is from 05-2009 in our records and at [facility]; if pt would like (per CDY) we can schedule at new one to be done. Please let me know what pt would like to do .Thanks Reynaldo Minium CMA  July 21, 2009 1:45 PM   in CY last note he stated that we would get the ct report form [facility] in october, the one in nov is the one the pt was talking about, she said she wasnt sure if it was done in oct or nov of 2010. Pt wants to know if CY wants another CT? or is the one form 11-10 sufficient? Please advise. Carron Curie CMA  July 21, 2009 2:15 PM   Additional Follow-up for Phone Call Additional follow up Details #1::        I need to look at images- I'm unclear just what has been done. Additional Follow-up by: Waymon Budge MD,  July 21, 2009 8:15 PM    Additional Follow-up for Phone Call Additional follow up Details #2::    I reviewed her CT from 05/31/09. Ths shows no active disease, with only a stable tiny nodule in the mid-right lung. Radiologist feels we do not need more xray follow-up. Please let her know. Follow-up by: Waymon Budge MD,  July 25, 2009 11:10 AM  Additional Follow-up for Phone Call Additional follow up Details #3:: Details for Additional Follow-up Action Taken: lmtcb  Philipp Deputy [facility]  July 26, 2009 2:49 PM  Pt daughter advised. Carron Curie CMA  July 26, 2009 4:17 PM

## 2010-08-14 NOTE — Miscellaneous (Signed)
Summary: Orders Update pft charges  Clinical Lists Changes  Orders: Added new Service order of Carbon Monoxide diffusing w/capacity (94720) - Signed Added new Service order of Lung Volumes (94240) - Signed Added new Service order of Spirometry (Pre & Post) (94060) - Signed 

## 2010-08-14 NOTE — Procedures (Signed)
Summary: Colonoscopy  Patient: [name] [name] Note: All result statuses are Final unless otherwise noted.  Tests: (1) Colonoscopy (COL)   COL Colonoscopy           DONE     [facility]     [address] [place], [place]  [address]           COLONOSCOPY PROCEDURE REPORT           PATIENT:  [name], [name]  MR#:  [id number]     BIRTHDATE:  01-May-1956, 53 yrs. old  GENDER:  female     ENDOSCOPIST:  Rachael Fee, MD     REF. BY:  Donia Guiles, M.D.     PROCEDURE DATE:  04/19/2010     PROCEDURE:  Colonoscopy with snare polypectomy     ASA CLASS:  Class III     INDICATIONS:  abdominal pain, recent CT suggesting thickened     sigmoid colon, treated with abx     MEDICATIONS:   MAC sedation, administered by CRNA     DESCRIPTION OF PROCEDURE:   After the risks benefits and     alternatives of the procedure were thoroughly explained, informed     consent was obtained.  Digital rectal exam was performed and     revealed no rectal masses.   The EC-[id number] ([id number]) endoscope was     introduced through the anus and advanced to the cecum, which was     identified by both the appendix and ileocecal valve, without     limitations.  The quality of the prep was good, using MoviPrep.     The instrument was then slowly withdrawn as the colon was fully     examined.     <<PROCEDUREIMAGES>>     FINDINGS:  A diminutive polyp was found in the descending colon.     This was 2-41mm across, removed with cold snare, not retrieved (see     image003).  Mild diverticulosis was found in the sigmoid to     descending colon segments (see image004).  This was otherwise a     normal examination of the colon (see image002, image001, and     image005).   Retroflexed views in the rectum revealed no     abnormalities.    The scope was then withdrawn from the patient     and the procedure completed.     COMPLICATIONS:  None           ENDOSCOPIC IMPRESSION:     1) Diminutive polyp in the descending colon,  removed but not     retrieved     2) Mild diverticulosis in the sigmoid to descending colon     segments     3) Otherwise normal examination           Her recent abdominal pain and thickened sigmoid colon was likely a     diverticulitis episode. She knows to call my office if she has     recurrent symptoms.           RECOMMENDATIONS:     1) Continue current colorectal screening recommendations for     "routine risk" patients with a repeat colonoscopy in 10 years.           ______________________________     Rachael Fee, MD           n.     eSIGNED:   Rachael Fee at 04/19/2010  07:55 AM           Debbra Riding, [id number]  Note: An exclamation mark (!) indicates a result that was not dispersed into the flowsheet. Document Creation Date: 04/19/2010 7:55 AM _______________________________________________________________________  (1) Order result status: Final Collection or observation date-time: 04/19/2010 07:50 Requested date-time:  Receipt date-time:  Reported date-time:  Referring Physician:   Ordering Physician: Rob Bunting [phone]) Specimen Source:  Source: Launa Grill Order Number: [id number] Lab site:   Appended Document: Colonoscopy she needs recall colonoscopy in 10 years  Appended Document: Colonoscopy    Clinical Lists Changes  Observations: Added new observation of COLONNXTDUE: 04/2020 (04/19/2010 8:08)

## 2010-08-14 NOTE — Assessment & Plan Note (Signed)
Summary: abnormal chest xray/cb   Primary Provider/Referring Provider:  [facility]  CC:  Trouble breathing-abnormal CXR.  History of Present Illness: 11/27/06- PROBLEMS: 1. Eczema. 2. History of urticaria. 3. Bronchitis. 4. Tobacco abuse. 5. Allergic rhinitis.   HISTORY:  She says rash was from hydrochlorothiazide and has been gone since she stopped that drug. She uses latex free gloves. Nasal congestion with mucus from the right nostril. Chest feels comfortable. She continues to smoke.   2009/07/27- Allergic rhinitis, tobacco, bronchitis, Hx Eczema/ urticaria......Marland Kitchenwith daughter CT 2009 showed small nodules. Had CT chest and MM to f/u, done at [facility] in October.  [facility] asked her to come back to me for f/u. She still smokes a ppd. We discussed smoking cessation and recommended patches. Baseline cough, wheeze, recurrent infections needing antibiotics.   Separate issue- last night ate avacado. In 15 min started generalized itch, rash and swelling. Took 3 benadryl, oatmeal bath. Then - Epipen, antihistamine and steroids. Prescibed epipen and prednisone. Still congested in nose and chest feels tight.         Preventive Screening-Counseling & Management  Alcohol-Tobacco     Smoking Status: current  Current Medications (verified): 1)  Proair Hfa 108 (90 Base) Mcg/act Aers (Albuterol Sulfate) .... 2 Puffs Four Times A Day As Needed 2)  Lexapro 20 Mg Tabs (Escitalopram Oxalate) .... Take 1 By Mouth Once Daily 3)  Ambien 10 Mg Tabs (Zolpidem Tartrate) .... Take 1 By Mouth At Bedtime 4)  Protonix 20 Mg Tbec (Pantoprazole Sodium) .... Take 1 By Mouth Once Daily 5)  Singulair 10 Mg Tabs (Montelukast Sodium) .... Take 1 By Mouth Once Daily 6)  Advair Diskus 250-50 Mcg/dose Aepb (Fluticasone-Salmeterol) .... Inhale 1 Puff Two Times A Day and Rinse Mouth Well 7)  Clonazepam 1 Mg Tabs (Clonazepam) .... Take 1 By Mouth Three Times A Day As Needed 8)  Tramadol  Hcl 50 Mg Tabs (Tramadol Hcl) .... Take 1-2 Four Times A Day As Needed 9)  Neurontin 600 Mg Tabs (Gabapentin) .... Take 1 By Mouth Four Times A Day 10)  Promethazine Hcl 25 Mg Tabs (Promethazine Hcl) .... Take 1 By Mouth Every 8 Hours As Needed 11)  Vita-Plus E 400 Unit Caps (Vitamin E) .... Take 1 By Mouth Once Daily 12)  Fish Oil 1000 Mg Caps (Omega-3 Fatty Acids) .... Take 1 By Mouth Once Daily  Allergies (verified): 1)  ! * Hctz 2)  ! Codeine 3)  ! * Latex  Past History:  Family History: Last updated: 27-Jul-2009 Fsather- died diabete, aspiration pneumonia  Social History: Last updated: 07/27/2009 Here with daughter Lives with mother and another woman Patient is a current smoker.   Risk Factors: Alcohol Use: 0 (12/22/2008) Caffeine Use: 5 (12/22/2008) Exercise: yes (12/22/2008)  Risk Factors: Smoking Status: current (07-27-09) Packs/Day: 0.5 (12/22/2008)  Past Medical History: Asthma Depression Hypothyroidism Degenerative disk disease  Past Surgical History: Total Abdominal Hysterectomy with laparoscopic exploration Tonsillectomy  Family History: Fsather- died diabete, aspiration pneumonia  Social History: Here with daughter Lives with mother and another woman Patient is a current smoker.   Review of Systems      See HPI  The patient denies anorexia, fever, weight loss, weight gain, vision loss, decreased hearing, hoarseness, chest pain, syncope, dyspnea on exertion, peripheral edema, prolonged cough, headaches, hemoptysis, and severe indigestion/heartburn.    Vital Signs:  Patient profile:   55 year old female Menstrual status:  hysterectomy Height:  61.5 inches Weight:      164.25 pounds BMI:     30.64 O2 Sat:      96 % on Room air Pulse rate:   91 / minute BP sitting:   138 / 70  (left arm) Cuff size:   regular  Vitals Entered By: Reynaldo Minium CMA (July 17, 2009 2:09 PM)  O2 Flow:  Room air  Physical Exam  Additional Exam:   General: A/Ox3; pleasant and cooperative, NAD, SKIN: no rash, lesions NODES: no lymphadenopathy HEENT: [place]/AT, EOM- WNL, Conjuctivae- clear, PERRLA, TM-WNL, Nose- congested with mucus , Throat- , dentures, red, [place]  II  NECK: Supple w/ fair ROM, JVD- none, normal carotid impulses w/o bruits Thyroid- normal to palpation CHEST: wheeze, shallow HEART: RRR, no m/g/r heard ABDOMEN: Soft and nl; AYT:KZSW, nl pulses, no edema  NEURO: Grossly intact to observation      Impression & Recommendations:  Problem # 1:  BRONCHITIS, CHRONIC (ICD-491.9)  Ongoing tobacco abuse with likely now significant chornic bronchits. Smoking cessation emphasized.  Problem # 2:  LUNG NODULE (ICD-518.89)  We are waiting for CT report from [facility] Meanwhile we will get PFT and give sample rescue inhaler.  Problem # 3:  CHRONIC RHINITIS (ICD-472.0)  We emphasized neti pot and will give neb and depo today  Problem # 4:  TOBACCO ABUSE (ICD-305.1)  Smoking cessation  Problem # 5:  ALLERGY, FOOD (ICD-693.1) Likely acute allergic reaction to guacamole/ avacado. We discussed avoidance, anitihstamines, epipen.  Medications Added to Medication List This Visit: 1)  Proair Hfa 108 (90 Base) Mcg/act Aers (Albuterol sulfate) .... 2 puffs four times a day as needed 2)  Lexapro 20 Mg Tabs (Escitalopram oxalate) .... Take 1 by mouth once daily 3)  Ambien 10 Mg Tabs (Zolpidem tartrate) .... Take 1 by mouth at bedtime 4)  Protonix 20 Mg Tbec (Pantoprazole sodium) .... Take 1 by mouth once daily 5)  Singulair 10 Mg Tabs (Montelukast sodium) .... Take 1 by mouth once daily 6)  Advair Diskus 250-50 Mcg/dose Aepb (Fluticasone-salmeterol) .... Inhale 1 puff two times a day and rinse mouth well 7)  Clonazepam 1 Mg Tabs (Clonazepam) .... Take 1 by mouth three times a day as needed 8)  Tramadol Hcl 50 Mg Tabs (Tramadol hcl) .... Take 1-2 four times a day as needed 9)  Neurontin 600 Mg Tabs (Gabapentin) .... Take 1 by  mouth four times a day 10)  Promethazine Hcl 25 Mg Tabs (Promethazine hcl) .... Take 1 by mouth every 8 hours as needed 11)  Vita-plus E 400 Unit Caps (Vitamin e) .... Take 1 by mouth once daily 12)  Fish Oil 1000 Mg Caps (Omega-3 fatty acids) .... Take 1 by mouth once daily  Other Orders: Est. Patient Level III (10932) Admin of Therapeutic Inj  intramuscular or subcutaneous (35573) Depo- Medrol 80mg  (J1040) Nebulizer Tx (22025)  Patient Instructions: 1)  Please schedule a follow-up appointment in 1 month. 2)  Sample rescue inhaler if available 3)  neb neo 4)  depo 80 5)  Schedule PFT 6)  Sample Epipen 7)  Please stop smoking! 8)  We will get CT report from [facility], done in October.     Medication Administration  Injection # 1:    Medication: Depo- Medrol 80mg     Diagnosis: CHRONIC RHINITIS (ICD-472.0)    Route: SQ    Site: RUOQ gluteus    Exp Date: 04/2010    Lot #: [id number]    Mfr: Teva  Patient tolerated injection without complications    Given by: Vivianne Spence 07/17/2009  Medication # 1:    Medication: EMR miscellaneous medications    Diagnosis: CHRONIC RHINITIS (ICD-472.0)    Dose: 3 drops    Route: intranasal    Exp Date: 11/2010    Lot #: [id number]    Mfr: Bayer    Comments: Neo-Synephrine    Patient tolerated medication without complications    Given by: Vivianne Spence 07/17/2009  Orders Added: 1)  Est. Patient Level III [[address]] 2)  Admin of Therapeutic Inj  intramuscular or subcutaneous [96372] 3)  Depo- Medrol 80mg  [J1040] 4)  Nebulizer Tx [98119]   Appended Document: abnormal chest xray/cb CT reviewed- 05/31/09- NAD stable 2-56mm nodule RML.     CT CHEST WITHOUT CONTRAST    Technique:  Multidetector CT imaging of the chest was performed   following the standard protocol without IV contrast.    Comparison: 04/12/2008    Findings: 4 cm bulla in the inferior right middle lobe is stable.   Adjacent noncalcified pulmonary nodule measuring  2-3 mm is also   stable.  No new or enlarging pulmonary nodules or masses are   identified.  There is no evidence of acute infiltrate.  There is no   evidence of pleural or pericardial effusion.    No hilar mediastinal masses are identified.  No adenopathy seen   elsewhere within the thorax.  Visualized upper abdominal structures   including the adrenal glands are unremarkable.    IMPRESSION:   No active disease.  Stable tiny 2-3 mm pulmonary nodule in the   inferior right middle lobe.  No further imaging followup is   necessary.    Read By:  Danae Orleans,  M.D.   Released By:  Danae Orleans,  Judie Petit.D.

## 2010-08-14 NOTE — Letter (Signed)
Summary: [name] [name] Instructions  [name] [name]  [address] [place], [place] [address]   Phone: [phone]  Fax: [phone]       [name] [name]    [birth year]    MRN: [id number]        Procedure Day /Date:04/19/10 THURS     Arrival Time:6 am     Procedure Time:730 am     Location of Procedure:                     X  [facility] ( Outpatient Registration)   PREPARATION FOR COLONOSCOPY WITH MOVIPREP   Starting 5 days prior to your procedure 04/14/10 do not eat nuts, seeds, popcorn, corn, beans, peas,  salads, or any raw vegetables.  Do not take any fiber supplements (e.g. Metamucil, Citrucel, and Benefiber).  THE DAY BEFORE YOUR PROCEDURE         DATE: 04/18/10  DAY: WED  1.  Drink clear liquids the entire day-NO SOLID FOOD  2.  Do not drink anything colored red or purple.  Avoid juices with pulp.  No orange juice.  3.  Drink at least 64 oz. (8 glasses) of fluid/clear liquids during the day to prevent dehydration and help the prep work efficiently.  CLEAR LIQUIDS INCLUDE: Water Jello Ice Popsicles Tea (sugar ok, no milk/cream) Powdered fruit flavored drinks Coffee (sugar ok, no milk/cream) Gatorade Juice: apple, white grape, white cranberry  Lemonade Clear bullion, consomm, broth Carbonated beverages (any kind) Strained chicken noodle soup Hard Candy                             4.  In the morning, mix first dose of MoviPrep solution:    Empty 1 Pouch A and 1 Pouch B into the disposable container    Add lukewarm drinking water to the top line of the container. Mix to dissolve    Refrigerate (mixed solution should be used within 24 hrs)  5.  Begin drinking the prep at 5:00 p.m. The MoviPrep container is divided by 4 marks.   Every 15 minutes drink the solution down to the next mark (approximately 8 oz) until the full liter is complete.   6.  Follow completed prep with 16 oz of clear liquid of your choice (Nothing red or purple).  Continue  to drink clear liquids until bedtime.  7.  Before going to bed, mix second dose of MoviPrep solution:    Empty 1 Pouch A and 1 Pouch B into the disposable container    Add lukewarm drinking water to the top line of the container. Mix to dissolve    Refrigerate  THE DAY OF YOUR PROCEDURE      DATE: 04/19/10 DAY: THURS  Beginning at 230 a.m. (5 hours before procedure):         1. Every 15 minutes, drink the solution down to the next mark (approx 8 oz) until the full liter is complete.  Nothing to eat or drink after midnight except colon prep   MEDICATION INSTRUCTIONS  Unless otherwise instructed, you should take regular prescription medications with a small sip of water   as early as possible the morning of your procedure.           OTHER INSTRUCTIONS  You will need a responsible adult at least 55 years of age to accompany you and drive you home.   This person must remain in the  waiting room during your procedure.  Wear loose fitting clothing that is easily removed.  Leave jewelry and other valuables at home.  However, you may wish to bring a book to read or  an iPod/MP3 player to listen to music as you wait for your procedure to start.  Remove all body piercing jewelry and leave at home.  Total time from sign-in until discharge is approximately 2-3 hours.  You should go home directly after your procedure and rest.  You can resume normal activities the  day after your procedure.  The day of your procedure you should not:   Drive   Make legal decisions   Operate machinery   Drink alcohol   Return to work  You will receive specific instructions about eating, activities and medications before you leave.    The above instructions have been reviewed and explained to me by   _______________________    I fully understand and can verbalize these instructions _____________________________ Date _________

## 2010-08-14 NOTE — Progress Notes (Signed)
Summary: Pre appt    Phone Note Outgoing Call Call back at Home Phone [phone]   Call placed by: Chales Abrahams CMA Duncan Dull),  April 09, 2010 10:55 AM Summary of Call: called to inform the pt of the pre appt with [facility] endo on 04/18/10 at 1 pm left message on machine to call back  Initial call taken by: Chales Abrahams CMA Duncan Dull),  April 09, 2010 10:56 AM  Follow-up for Phone Call        pt returned call and is aware of the pre appt Follow-up by: Chales Abrahams CMA Duncan Dull),  April 09, 2010 12:22 PM

## 2010-08-14 NOTE — Assessment & Plan Note (Signed)
History of Present Illness Visit Type: new patient  Primary GI MD: Rob Bunting MD Primary Provider: [facility]  Requesting Provider: na Chief Complaint: bloating and LLQ abd pain  History of Present Illness:        very pleasant 55 year old woman who is here with her daughter today. She was reaching for something from her knees and head pain in left side, groin.  Then underwent CT scan that showed sigmoid thickening, ? diverticulitis.  Was treated with cipro, flagyl. The pain didn't really improve. she has had no fevers or chills but has noticed a change in her bowel movements. She has alternating constipation, dirrhea ever since the pains begain.  She has severe CAD, mutliple coronary stents.  she is on effient for the CAD, stents           Current Medications (verified): 1)  Ventolin Hfa 108 (90 Base) Mcg/act Aers (Albuterol Sulfate) .... 2 Puffs Four Times A Day As Needed 2)  Flonase 50 Mcg/act Susp (Fluticasone Propionate) .Marland Kitchen.. 1-2 Sprays Each Nostril Once Every Day 3)  Advair Diskus 250-50 Mcg/dose Aepb (Fluticasone-Salmeterol) .... Inhale 1 Puff Two Times A Day and Rinse Mouth Well 4)  Epipen 0.3 Mg/0.61ml Devi (Epinephrine) 5)  Lexapro 20 Mg Tabs (Escitalopram Oxalate) .... Take 1 By Mouth Once Daily 6)  Ambien 10 Mg Tabs (Zolpidem Tartrate) .... Take 1 By Mouth At Bedtime 7)  Protonix 40 Mg Tbec (Pantoprazole Sodium) .... Take 1 By Mouth Once Daily 8)  Clonazepam 1 Mg Tabs (Clonazepam) .... Take 1 By Mouth Three Times A Day As Needed 9)  Tramadol Hcl 50 Mg Tabs (Tramadol Hcl) .... Take 1-2 Four Times A Day As Needed 10)  Neurontin 600 Mg Tabs (Gabapentin) .... Take 1 By Mouth Four Times A Day 11)  Vita-Plus E 400 Unit Caps (Vitamin E) .... Take 1 By Mouth Once Daily 12)  Fish Oil 1000 Mg Caps (Omega-3 Fatty Acids) .... Take 1 By Mouth Once Daily 13)  Levothyroxine Sodium 100 Mcg Tabs (Levothyroxine Sodium) .... Take 1 By Mouth Once Daily 14)  Aspirin 81 Mg Tbec  (Aspirin) .... Take 1 By Mouth Once Daily 15)  Vitamin B-12 100 Mcg Tabs (Cyanocobalamin) .... Take 1 By Mouth Once Daily As Needed 16)  Mirtazapine 15 Mg Tabs (Mirtazapine) .... Take 1 By Mouth At Bedtime 17)  Pravastatin Sodium 40 Mg Tabs (Pravastatin Sodium) .... One Tablet By Mouth Once Daily 18)  Xyzal 5 Mg Tabs (Levocetirizine Dihydrochloride) .... One Tablet By Mouth Once Daily 19)  Dok 100 Mg Caps (Docusate Sodium) .... One Capsule By Mouth in The Afternoon and One Capsule By Mouth At Bedtime 20)  Lisinopril 5 Mg Tabs (Lisinopril) .... One Tablet By Mouth Once Daily 21)  Labetalol Hcl 100 Mg Tabs (Labetalol Hcl) .... One Tablet By Mouth Once Daily 22)  Effient 10 Mg Tabs (Prasugrel Hcl) .... One Tablet By Mouth Once Daily  Allergies (verified): 1)  ! * Hctz 2)  ! Codeine 3)  ! Avacado 4)  ! * Latex  Past History:  Past Medical History: Asthma Depression Hypothyroidism Degenerative disk disease severe coronary artery disease requiring multiple drug-eluting stents, 6 or 7 to date Previous alcoholism Anxiety Elevated cholesterol Hypertension Obesity  Past Surgical History: Total Abdominal Hysterectomy with laparoscopic exploration Tonsillectomy coronary artery stenting  Family History: Fsather- died diabete, aspiration pneumonia no colon cancer  Social History: Here with daughter Lives with mother and another woman Patient is a current smoker.  Review of Systems       Pertinent positive and negative review of systems were noted in the above HPI and GI specific review of systems.  All other review of systems was otherwise negative.   Vital Signs:  Patient profile:   55 year old female Menstrual status:  hysterectomy Height:      61.5 inches Weight:      168 pounds BMI:     31.34 BSA:     1.77 Pulse rate:   88 / minute Pulse rhythm:   regular BP sitting:   120 / 74  (left arm) Cuff size:   regular  Vitals Entered By: Ok Anis CMA (March 28, 2010 3:39 PM)  Physical Exam  Additional Exam:  Constitutional: generally well appearing Psychiatric: alert and oriented times 3 Eyes: extraocular movements intact Mouth: oropharynx moist, no lesions Neck: supple, no lymphadenopathy Cardiovascular: heart regular rate and rythm Lungs: CTA bilaterally Abdomen: soft,Very mildly tender in the left lower quadrant, non-distended, no obvious ascites, no peritoneal signs, normal bowel sounds Extremities: no lower extremity edema bilaterally Skin: no lesions on visible extremities    Impression & Recommendations:  Problem # 1:  abnormal CT scan, mild tenderness in the left lower quadrant, change in bowel patterns she absolutely cannot stop blood thinning medicines due to her severe coronary artery disease. She most recently had drug-eluting stents placed 2-3 months ago. Given her symptoms, the CT scan findings, I think we should proceed with diagnostic colonoscopy at her soonest convenience. She can stay on blood thinners. I would like to do this over [facility] long hospital with propofol sedation given her severe coronary artery disease.  she does have some mild left sided abdominal discomforts but I do not think she has acute diverticulitis.  Other Orders: TLB-CBC Platelet - w/Differential (85025-CBCD) TLB-CMP (Comprehensive Metabolic Pnl) ([id number])  Patient Instructions: 1)  You will be scheduled to have a colonoscopy at [facility] given such severe CAD.  Should stay on blood thinners for the procedure. 2)  You will get lab test(s) done today (cbc, cmet) 3)  A copy of this information will be sent to Dr. Allyson Sabal. 4)  The medication list was reviewed and reconciled.  All changed / newly prescribed medications were explained.  A complete medication list was provided to the patient / caregiver.  Appended Document: Orders Update/Movi    Clinical Lists Changes  Orders: Added new Test order of Colonoscopy (Colon) - Signed

## 2010-08-14 NOTE — Assessment & Plan Note (Signed)
Summary: 1 month w/pft/apc   Primary Provider/Referring Provider:  [facility]  CC:  Follow up visit after PFT.[name] [name]  History of Present Illness:  HISTORY:  She says rash was from hydrochlorothiazide and has been gone since she stopped that drug. She uses latex free gloves. Nasal congestion with mucus from the right nostril. Chest feels comfortable. She continues to smoke.   July 21, 2009- Allergic rhinitis, tobacco, bronchitis, Hx Eczema/ urticaria......[name] Kitchenwith daughter CT 2009 showed small nodules. Had CT chest and MM to f/u, done at [facility] in October.  [facility] asked her to come back to me for f/u. She still smokes a ppd. We discussed smoking cessation and recommended patches. Baseline cough, wheeze, recurrent infections needing antibiotics.   Separate issue- last night ate avacado. In 15 min started generalized itch, rash and swelling. Took 3 benadryl, oatmeal bath. Then [facility]- Epipen, antihistamine and steroids. Prescibed epipen and prednisone. Still congested in nose and chest feels tight.   September 15, 2009- Allergic rhinitis, tobacco, bronchitis, Hx eczema/ urticaria......[name] Kitchenwith daughter Comes for review of PFT. Keeps some cough. On amoxacillin for acute bronchitis x 1 week. Had flu vax. Has cut down smoking to 1/2 ppd, but recognizes she is getting more short of breath  CT- reviewed with her from November, 2010- 2-3 mm RML nodule- c/w benign. PFT- Severe obstructive airways w/ response to BD, FEV1/FVC 0.42.  Hx rash and tight throat with avacado and with latex.- Now has epipen.   Current Medications (verified): 1)  Ventolin Hfa 108 (90 Base) Mcg/act Aers (Albuterol Sulfate) .... 2 Puffs Four Times A Day As Needed 2)  Flonase 50 Mcg/act Susp (Fluticasone Propionate) .[name] [name].. 1-2 Sprays Each Nostril Once Every Day 3)  Advair Diskus 250-50 Mcg/dose Aepb (Fluticasone-Salmeterol) .... Inhale 1 Puff Two Times A Day and Rinse Mouth Well 4)  Epipen 0.3 Mg/0.29ml Devi  (Epinephrine) 5)  Lexapro 20 Mg Tabs (Escitalopram Oxalate) .... Take 1 By Mouth Once Daily 6)  Ambien 10 Mg Tabs (Zolpidem Tartrate) .... Take 1 By Mouth At Bedtime 7)  Protonix 40 Mg Tbec (Pantoprazole Sodium) .... Take 1 By Mouth Once Daily 8)  Clonazepam 1 Mg Tabs (Clonazepam) .... Take 1 By Mouth Three Times A Day As Needed 9)  Tramadol Hcl 50 Mg Tabs (Tramadol Hcl) .... Take 1-2 Four Times A Day As Needed 10)  Neurontin 600 Mg Tabs (Gabapentin) .... Take 1 By Mouth Four Times A Day 11)  Vita-Plus E 400 Unit Caps (Vitamin E) .... Take 1 By Mouth Once Daily 12)  Fish Oil 1000 Mg Caps (Omega-3 Fatty Acids) .... Take 1 By Mouth Once Daily 13)  Allegra 180 Mg Tabs (Fexofenadine Hcl) .... Take 1 By Mouth Once Daily 14)  Levothyroxine Sodium 100 Mcg Tabs (Levothyroxine Sodium) .... Take 1 By Mouth Once Daily 15)  Aspirin 81 Mg Tbec (Aspirin) .... Take 1 By Mouth Once Daily 16)  Vitamin B-12 100 Mcg Tabs (Cyanocobalamin) .... Take 1 By Mouth Once Daily As Needed 17)  Mirtazapine 15 Mg Tabs (Mirtazapine) .... Take 1 By Mouth At Bedtime  Allergies (verified): 1)  ! * Hctz 2)  ! Codeine 3)  ! Avacado 4)  ! * Latex  Past History:  Past Medical History: Last updated: 07-21-2009 Asthma Depression Hypothyroidism Degenerative disk disease  Past Surgical History: Last updated: 07-21-2009 Total Abdominal Hysterectomy with laparoscopic exploration Tonsillectomy  Family History: Last updated: 2009-07-21 Fsather- died diabete, aspiration pneumonia  Social History: Last updated: July 21, 2009 Here with daughter  Lives with mother and another woman Patient is a current smoker.   Risk Factors: Alcohol Use: 0 (12/22/2008) Caffeine Use: 5 (12/22/2008) Exercise: yes (12/22/2008)  Risk Factors: Smoking Status: current (07/17/2009) Packs/Day: 0.5 (12/22/2008)  Review of Systems      See HPI  The patient denies anorexia, fever, weight loss, weight gain, vision loss, decreased hearing,  hoarseness, chest pain, syncope, dyspnea on exertion, headaches, hemoptysis, abdominal pain, and severe indigestion/heartburn.    Vital Signs:  Patient profile:   55 year old female Menstrual status:  hysterectomy Height:      61.5 inches Weight:      167 pounds BMI:     31.16 O2 Sat:      95 % on Room air Pulse rate:   81 / minute BP sitting:   118 / 62  (left arm) Cuff size:   regular  Vitals Entered By: Reynaldo Minium CMA (September 15, 2009 4:26 PM)  O2 Flow:  Room air  Physical Exam  Additional Exam:  General: A/Ox3; pleasant and cooperative, NAD, SKIN: no rash, lesions NODES: no lymphadenopathy HEENT: /AT, EOM- WNL, Conjuctivae- clear, PERRLA, TM-WNL, Nose- congested with mucus , Throat- , dentures, red, Mellampatti  II  NECK: Supple w/ fair ROM, JVD- none, normal carotid impulses w/o bruits Thyroid-  CHEST: Raspy vcough but not wheezing or rhonchi HEART: RRR, no m/g/r heard ABDOMEN: Soft and nl; YNW:GNFA, nl pulses, no edema  NEURO: Grossly intact to observation      Impression & Recommendations:  Problem # 1:  TOBACCO ABUSE (ICD-305.1)  Smoking cessation and use of patches was dicussed with support available.  Problem # 2:  COPD (ICD-496)  Severe COPD with acute exacerbation.  We reviewed her PFT and her previous CT in detail.The lung nodule is an old granuloma of no concern, but I explained that we will need to watch over time for new spots. We can't tell if Singulair helps so as it runs out she will quit.  Orders: Est. Patient Level III (21308)  Medications Added to Medication List This Visit: 1)  Ventolin Hfa 108 (90 Base) Mcg/act Aers (Albuterol sulfate) .... 2 puffs four times a day as needed 2)  Flonase 50 Mcg/act Susp (Fluticasone propionate) .[name] [name].. 1-2 sprays each nostril once every day 3)  Epipen 0.3 Mg/0.39ml Devi (Epinephrine) 4)  Protonix 40 Mg Tbec (Pantoprazole sodium) .... Take 1 by mouth once daily 5)  Allegra 180 Mg Tabs (Fexofenadine hcl)  .... Take 1 by mouth once daily 6)  Levothyroxine Sodium 100 Mcg Tabs (Levothyroxine sodium) .... Take 1 by mouth once daily 7)  Aspirin 81 Mg Tbec (Aspirin) .... Take 1 by mouth once daily 8)  Vitamin B-12 100 Mcg Tabs (Cyanocobalamin) .... Take 1 by mouth once daily as needed 9)  Mirtazapine 15 Mg Tabs (Mirtazapine) .... Take 1 by mouth at bedtime  Patient Instructions: 1)  Please schedule a follow-up appointment in 4 months. 2)  Please give yourself a present- stop smoking ! You don't need to buy any more. The 21 mg nicotine patches can be a real help. 3)  Try Udder Cream for your hands Prescriptions: FLONASE 50 MCG/ACT SUSP (FLUTICASONE PROPIONATE) 1-2 sprays each nostril once every day  #1 x prn   Entered and Authorized by:   Waymon Budge MD   Signed by:   Waymon Budge MD on 09/15/2009   Method used:   Historical   RxID:   [id number]

## 2010-09-29 LAB — COMPREHENSIVE METABOLIC PANEL
ALT: 21 U/L (ref 0–35)
AST: 25 U/L (ref 0–37)
Albumin: 3.8 g/dL (ref 3.5–5.2)
Alkaline Phosphatase: 64 U/L (ref 39–117)
BUN: 15 mg/dL (ref 6–23)
GFR calc Af Amer: >60 mL/min (ref >60)
Potassium: 4 mEq/L (ref 3.5–5.1)
Sodium: 138 mEq/L (ref 135–145)
Total Protein: 6 g/dL (ref 6.0–8.3)

## 2010-09-29 LAB — CARDIAC PANEL(CRET KIN+CKTOT+MB+TROPI)
CK, MB: 1.4 ng/mL (ref 0.3–4.0)
CK, MB: 1.6 ng/mL (ref 0.3–4.0)
Relative Index: INVALID (ref 0.0–2.5)
Relative Index: INVALID (ref 0.0–2.5)
Relative Index: INVALID (ref 0.0–2.5)
Total CK: 63 U/L (ref 7–177)
Total CK: 70 U/L (ref 7–177)
Total CK: 77 U/L (ref 7–177)
Troponin I: 0.06 ng/mL (ref 0.00–0.06)
Troponin I: 0.1 ng/mL — ABNORMAL HIGH (ref 0.00–0.06)

## 2010-09-29 LAB — LIPID PANEL
LDL Cholesterol: 64 mg/dL (ref 0–99)
Total CHOL/HDL Ratio: 2.2 RATIO
VLDL: 11 mg/dL (ref 0–40)

## 2010-09-29 LAB — BASIC METABOLIC PANEL
BUN: 10 mg/dL (ref 6–23)
CO2: 27 mEq/L (ref 19–32)
Calcium: 8.6 mg/dL (ref 8.4–10.5)
Chloride: 105 mEq/L (ref 96–112)
Creatinine, Ser: 0.63 mg/dL (ref 0.4–1.2)
Creatinine, Ser: 0.74 mg/dL (ref 0.4–1.2)
GFR calc Af Amer: >60 mL/min (ref >60)
GFR calc Af Amer: >60 mL/min (ref >60)
GFR calc non Af Amer: >60 mL/min (ref >60)
GFR calc non Af Amer: >60 mL/min (ref >60)
GFR calc non Af Amer: >60 mL/min (ref >60)
Glucose, Bld: 132 mg/dL — ABNORMAL HIGH (ref 70–99)
Glucose, Bld: 99 mg/dL (ref 70–99)
Potassium: 4 mEq/L (ref 3.5–5.1)
Sodium: 136 mEq/L (ref 135–145)
Sodium: 139 mEq/L (ref 135–145)
Sodium: 140 mEq/L (ref 135–145)

## 2010-09-29 LAB — RAPID URINE DRUG SCREEN, HOSP PERFORMED
Amphetamines: NOT DETECTED
Barbiturates: NOT DETECTED
Benzodiazepines: NOT DETECTED
Tetrahydrocannabinol: NOT DETECTED

## 2010-09-29 LAB — CBC
HCT: 39.1 % (ref 36.0–46.0)
Hemoglobin: 12.3 g/dL (ref 12.0–15.0)
Hemoglobin: 12.7 g/dL (ref 12.0–15.0)
Hemoglobin: 13.3 g/dL (ref 12.0–15.0)
MCH: 31.8 pg (ref 26.0–34.0)
MCH: 32.1 pg (ref 26.0–34.0)
MCH: 32.3 pg (ref 26.0–34.0)
MCHC: 33.4 g/dL (ref 30.0–36.0)
MCHC: 33.7 g/dL (ref 30.0–36.0)
MCHC: 33.8 g/dL (ref 30.0–36.0)
MCHC: 34.1 g/dL (ref 30.0–36.0)
MCV: 94.6 fL (ref 78.0–100.0)
MCV: 95.5 fL (ref 78.0–100.0)
Platelets: 325 10*3/uL (ref 150–400)
Platelets: 331 10*3/uL (ref 150–400)
Platelets: 336 10*3/uL (ref 150–400)
Platelets: 354 10*3/uL (ref 150–400)
Platelets: 355 10*3/uL (ref 150–400)
RBC: 3.89 MIL/uL (ref 3.87–5.11)
RBC: 3.97 MIL/uL (ref 3.87–5.11)
RBC: 4.07 MIL/uL (ref 3.87–5.11)
RDW: 14.1 % (ref 11.5–15.5)
RDW: 14.2 % (ref 11.5–15.5)
RDW: 14.2 % (ref 11.5–15.5)
RDW: 14.3 % (ref 11.5–15.5)
WBC: 6.5 10*3/uL (ref 4.0–10.5)
WBC: 6.7 10*3/uL (ref 4.0–10.5)
WBC: 7.3 10*3/uL (ref 4.0–10.5)
WBC: 8.2 10*3/uL (ref 4.0–10.5)

## 2010-09-29 LAB — PROTIME-INR: Prothrombin Time: 12.8 seconds (ref 11.6–15.2)

## 2010-09-29 LAB — DIFFERENTIAL
Basophils Relative: 1 % (ref 0–1)
Eosinophils Absolute: 0.3 10*3/uL (ref 0.0–0.7)
Eosinophils Relative: 4 % (ref 0–5)
Lymphs Abs: 3.3 10*3/uL (ref 0.7–4.0)
Monocytes Relative: 7 % (ref 3–12)

## 2010-09-29 LAB — POCT CARDIAC MARKERS
CKMB, poc: 1.1 ng/mL (ref 1.0–8.0)
CKMB, poc: <1 ng/mL — ABNORMAL LOW (ref 1.0–8.0)
Myoglobin, poc: 43.5 ng/mL (ref 12–200)
Myoglobin, poc: 48.5 ng/mL (ref 12–200)

## 2010-09-29 LAB — HEPARIN LEVEL (UNFRACTIONATED)
Heparin Unfractionated: 0.36 IU/mL (ref 0.30–0.70)
Heparin Unfractionated: 0.48 IU/mL (ref 0.30–0.70)
Heparin Unfractionated: 0.55 IU/mL (ref 0.30–0.70)
Heparin Unfractionated: 0.76 IU/mL — ABNORMAL HIGH (ref 0.30–0.70)

## 2010-09-29 LAB — CK TOTAL AND CKMB (NOT AT ARMC): Relative Index: INVALID (ref 0.0–2.5)

## 2010-09-29 LAB — HEMOCCULT GUIAC POC 1CARD (OFFICE): Fecal Occult Bld: NEGATIVE

## 2010-10-02 LAB — DIFFERENTIAL
Basophils Absolute: 0.1 10*3/uL (ref 0.0–0.1)
Basophils Relative: 1 % (ref 0–1)
Eosinophils Absolute: 0.2 10*3/uL (ref 0.0–0.7)
Eosinophils Absolute: 0.3 10*3/uL (ref 0.0–0.7)
Eosinophils Relative: 4 % (ref 0–5)
Lymphocytes Relative: 44 % (ref 12–46)
Lymphs Abs: 3 10*3/uL (ref 0.7–4.0)
Monocytes Absolute: 0.7 10*3/uL (ref 0.1–1.0)
Monocytes Absolute: 0.7 10*3/uL (ref 0.1–1.0)
Monocytes Relative: 8 % (ref 3–12)

## 2010-10-02 LAB — HEPARIN LEVEL (UNFRACTIONATED)
Heparin Unfractionated: 0.15 IU/mL — ABNORMAL LOW (ref 0.30–0.70)
Heparin Unfractionated: 0.32 IU/mL (ref 0.30–0.70)
Heparin Unfractionated: 0.4 IU/mL (ref 0.30–0.70)

## 2010-10-02 LAB — BASIC METABOLIC PANEL
BUN: 9 mg/dL (ref 6–23)
CO2: 28 mEq/L (ref 19–32)
Calcium: 8.4 mg/dL (ref 8.4–10.5)
Calcium: 8.8 mg/dL (ref 8.4–10.5)
Calcium: 9.2 mg/dL (ref 8.4–10.5)
Creatinine, Ser: 0.79 mg/dL (ref 0.4–1.2)
Creatinine, Ser: 0.85 mg/dL (ref 0.4–1.2)
GFR calc Af Amer: >60 mL/min (ref >60)
GFR calc Af Amer: >60 mL/min (ref >60)
GFR calc non Af Amer: >60 mL/min (ref >60)
GFR calc non Af Amer: >60 mL/min (ref >60)
Glucose, Bld: 104 mg/dL — ABNORMAL HIGH (ref 70–99)
Sodium: 140 mEq/L (ref 135–145)
Sodium: 141 mEq/L (ref 135–145)
Sodium: 143 mEq/L (ref 135–145)

## 2010-10-02 LAB — COMPREHENSIVE METABOLIC PANEL
ALT: 13 U/L (ref 0–35)
ALT: 14 U/L (ref 0–35)
AST: 15 U/L (ref 0–37)
Albumin: 3 g/dL — ABNORMAL LOW (ref 3.5–5.2)
Albumin: 3.3 g/dL — ABNORMAL LOW (ref 3.5–5.2)
Alkaline Phosphatase: 85 U/L (ref 39–117)
CO2: 27 mEq/L (ref 19–32)
Calcium: 8.7 mg/dL (ref 8.4–10.5)
Chloride: 109 mEq/L (ref 96–112)
Creatinine, Ser: 0.88 mg/dL (ref 0.4–1.2)
GFR calc Af Amer: >60 mL/min (ref >60)
GFR calc Af Amer: >60 mL/min (ref >60)
GFR calc non Af Amer: >60 mL/min (ref >60)
Potassium: 3.9 mEq/L (ref 3.5–5.1)
Potassium: 4.1 mEq/L (ref 3.5–5.1)
Sodium: 137 mEq/L (ref 135–145)
Sodium: 142 mEq/L (ref 135–145)
Total Bilirubin: 0.4 mg/dL (ref 0.3–1.2)
Total Protein: 6.1 g/dL (ref 6.0–8.3)

## 2010-10-02 LAB — CBC
Hemoglobin: 11.8 g/dL — ABNORMAL LOW (ref 12.0–15.0)
Hemoglobin: 12.7 g/dL (ref 12.0–15.0)
MCHC: 34.3 g/dL (ref 30.0–36.0)
MCHC: 34.5 g/dL (ref 30.0–36.0)
MCV: 95.4 fL (ref 78.0–100.0)
Platelets: 313 10*3/uL (ref 150–400)
Platelets: 335 10*3/uL (ref 150–400)
Platelets: 355 10*3/uL (ref 150–400)
Platelets: 366 10*3/uL (ref 150–400)
RBC: 3.58 MIL/uL — ABNORMAL LOW (ref 3.87–5.11)
RBC: 3.99 MIL/uL (ref 3.87–5.11)
RDW: 13.8 % (ref 11.5–15.5)
RDW: 13.9 % (ref 11.5–15.5)
RDW: 14.3 % (ref 11.5–15.5)
RDW: 14.3 % (ref 11.5–15.5)
WBC: 8 10*3/uL (ref 4.0–10.5)

## 2010-10-02 LAB — CARDIAC PANEL(CRET KIN+CKTOT+MB+TROPI)
CK, MB: 1.7 ng/mL (ref 0.3–4.0)
CK, MB: 1.8 ng/mL (ref 0.3–4.0)
Relative Index: INVALID (ref 0.0–2.5)
Relative Index: INVALID (ref 0.0–2.5)
Total CK: 65 U/L (ref 7–177)
Troponin I: 0.11 ng/mL — ABNORMAL HIGH (ref 0.00–0.06)
Troponin I: 0.2 ng/mL — ABNORMAL HIGH (ref 0.00–0.06)

## 2010-10-02 LAB — PROTIME-INR
INR: 0.88 (ref 0.00–1.49)
Prothrombin Time: 11.9 seconds (ref 11.6–15.2)

## 2010-10-02 LAB — RAPID URINE DRUG SCREEN, HOSP PERFORMED
Amphetamines: NOT DETECTED
Benzodiazepines: NOT DETECTED
Cocaine: POSITIVE — AB
Tetrahydrocannabinol: NOT DETECTED

## 2010-10-02 LAB — CK TOTAL AND CKMB (NOT AT ARMC)
CK, MB: 1.3 ng/mL (ref 0.3–4.0)
Relative Index: INVALID (ref 0.0–2.5)
Relative Index: INVALID (ref 0.0–2.5)
Total CK: 53 U/L (ref 7–177)
Total CK: 65 U/L (ref 7–177)
Total CK: 71 U/L (ref 7–177)

## 2010-10-02 LAB — URINALYSIS, ROUTINE W REFLEX MICROSCOPIC
Glucose, UA: NEGATIVE mg/dL
Ketones, ur: NEGATIVE mg/dL
Nitrite: NEGATIVE
Protein, ur: NEGATIVE mg/dL

## 2010-10-02 LAB — URINE MICROSCOPIC-ADD ON

## 2010-10-02 LAB — LIPID PANEL
HDL: 52 mg/dL (ref >39)
Total CHOL/HDL Ratio: 3.6 RATIO

## 2010-10-02 LAB — POCT CARDIAC MARKERS
Myoglobin, poc: 44.8 ng/mL (ref 12–200)
Troponin i, poc: 0.08 ng/mL (ref 0.00–0.09)

## 2010-10-08 ENCOUNTER — Other Ambulatory Visit (HOSPITAL_COMMUNITY): Payer: Self-pay | Admitting: Internal Medicine

## 2010-10-08 DIAGNOSIS — Z1231 Encounter for screening mammogram for malignant neoplasm of breast: Secondary | ICD-10-CM

## 2010-10-11 ENCOUNTER — Ambulatory Visit (HOSPITAL_COMMUNITY): Payer: Self-pay | Attending: Internal Medicine

## 2010-10-15 LAB — DIFFERENTIAL
Basophils Absolute: 0 10*3/uL (ref 0.0–0.1)
Eosinophils Absolute: 0.2 10*3/uL (ref 0.0–0.7)
Lymphs Abs: 0.9 10*3/uL (ref 0.7–4.0)
Monocytes Absolute: 0.9 10*3/uL (ref 0.1–1.0)
Monocytes Relative: 4 % (ref 3–12)
Neutro Abs: 19.9 10*3/uL — ABNORMAL HIGH (ref 1.7–7.7)

## 2010-10-15 LAB — CBC
Hemoglobin: 17.6 g/dL — ABNORMAL HIGH (ref 12.0–15.0)
MCHC: 33.2 g/dL (ref 30.0–36.0)
MCV: 93.5 fL (ref 78.0–100.0)
RBC: 5.7 MIL/uL — ABNORMAL HIGH (ref 3.87–5.11)
WBC: 21.9 10*3/uL — ABNORMAL HIGH (ref 4.0–10.5)

## 2010-10-15 LAB — URINALYSIS, ROUTINE W REFLEX MICROSCOPIC
Hgb urine dipstick: NEGATIVE
Ketones, ur: 15 mg/dL — AB
Nitrite: NEGATIVE
Protein, ur: 100 mg/dL — AB
Specific Gravity, Urine: 1.027 (ref 1.005–1.030)
Urobilinogen, UA: 0.2 mg/dL (ref 0.0–1.0)

## 2010-10-15 LAB — URINE CULTURE
Colony Count: NO GROWTH
Culture: NO GROWTH

## 2010-10-15 LAB — COMPREHENSIVE METABOLIC PANEL
ALT: 14 U/L (ref 0–35)
AST: 20 U/L (ref 0–37)
CO2: 20 mEq/L (ref 19–32)
Calcium: 10.5 mg/dL (ref 8.4–10.5)
Chloride: 105 mEq/L (ref 96–112)
GFR calc non Af Amer: 51 mL/min — ABNORMAL LOW (ref >60)
Glucose, Bld: 162 mg/dL — ABNORMAL HIGH (ref 70–99)
Sodium: 139 mEq/L (ref 135–145)
Total Bilirubin: 0.7 mg/dL (ref 0.3–1.2)

## 2010-10-15 LAB — LIPASE, BLOOD: Lipase: 20 U/L (ref 11–59)

## 2010-10-15 LAB — URINE MICROSCOPIC-ADD ON

## 2010-11-16 ENCOUNTER — Inpatient Hospital Stay (INDEPENDENT_AMBULATORY_CARE_PROVIDER_SITE_OTHER)
Admission: RE | Admit: 2010-11-16 | Discharge: 2010-11-16 | Disposition: A | Discharge status: Home / Self Care | Payer: Self-pay | Source: Ambulatory Visit | Attending: Family Medicine | Admitting: Family Medicine

## 2010-11-16 DIAGNOSIS — J019 Acute sinusitis, unspecified: Secondary | ICD-10-CM

## 2010-11-16 DIAGNOSIS — J45909 Unspecified asthma, uncomplicated: Secondary | ICD-10-CM

## 2010-11-27 NOTE — Assessment & Plan Note (Signed)
[facility]                             PULMONARY OFFICE NOTE   NAME:[name], [name] MCCOLLISTER                        MRN:          [id number]  DATE:11/11/2006                            DOB:          14-Dec-1955    NEW PATIENT CONSULTATION:   PROBLEM:  Allergy consultation and pulmonary evaluation at the kind  request of Dr. Emeline [name] for this 55 year old woman who complains of rash and  exertional shortness of breath.   HISTORY:  Years ago I had apparently done allergy testing on her as part  of a workup for asthma.  She was on allergy vaccine for awhile, but we  do not have the old records, and she does not remember details.  She  comes now to discuss a rash that seems to have been new for her starting  around February.  It began as a scaling process at the flexure of her  left thumb.  She then developed a generalized rash involving primarily  the face, neck, sun-exposed arms, and scalp.  This more general rash has  persisted.  [facility] has given three burst and taper courses of  prednisone, the last ending about a week ago.  Each time she has been  off the prednisone for about a week, she begins this generalized itching  again, sometimes involving the corners of her eyes and her palate.  She  has always had dry skin but does not remember a significant sustained  dermatosis except in 1998.  At that time she had urticaria which was  biopsied and blamed on stress.  It went away.  She has been using  Aveeno and other over-the-counter skin moisturizers, given a  longstanding history of dry skin.   MEDICATIONS:  Claritin, Benadryl, Singulair, hydrochlorothiazide, Advair  250/50, Protonix, Lexapro, Levothroid 100 mcg, rare rescue use of an  albuterol inhaler.   Drug sensitive only to LATEX contact.   REVIEW OF SYSTEMS:  Exertional dyspnea, some cough, weight change,  mostly gain.  Difficulty swallowing, nasal congestion, itching.  Her  main complaint now is the  rash.   PAST MEDICAL HISTORY:  1. Urticaria in 1998, biopsied, stress.  2. Hypertension.  3. Asthma.  4. Allergic rhinitis.  5. Prior allergy skin testing and vaccine years ago, apparently not      continued long.  6. Surgery for hysterectomy.   SOCIAL HISTORY:  Smoking one pack per day.  Ethanol and street drug use  have stopped.  She is not married.  She does have children.  Living now  in a shelter home. She works part time doing house cleaning, and this  started about 2 months ago.  She has been getting her hair bleached for  a long time with no changes recognized from that.  New for her is living  now in a house where there are 3 dogs indoors.  She has been using  fabric softener dryer sheets.  On hydrochlorothiazide since mid summer  of last year.   OBJECTIVE:  VITAL SIGNS: Weight 171 pounds.  Blood pressure 130/82,  pulse 74, room air saturation  97%.  GENERAL:  Partly bleached hair.  Mildly obese, generalized dry skin.  There is an eczematoid area at the flexure of her left thumb and minimal  excoriation along the dorsum of her right forearm.  ABDOMEN:  Right upper quadrant is tender to palpation, but I cannot feel  a mass or liver.  CHEST: Exam reveals a somewhat wheezy cough with laughter.  No dullness,  rhonchi, or rales.  CARDIAC:  Heart sounds are regular without murmur or gallop heard.   IMPRESSION:  1. Eczema.  2. Possible additional diagnosis of urticaria.  3. Bronchitis.  4. Tobacco abuse.  5. Atopic with positive skin tests in past as above.   PLAN:  1. Steroid talk.  2. Stop using fabric softener sheets.  3. May continue Aveeno.  4. Stop hydrochlorothiazide for 1 month for observation, then resume.  5. Powder-free, Latex-free gloves for housework.  6. Environmental and dust precautions as outlined.  7. She was given a standby prescription to hold for prednisone 10 mg,      #50, to take 1 daily as needed instead of repeated bursts and      tapers.  8.  Schedule return in 3 weeks, earlier p.r.n.     [name] D. Maple Hudson, MD, [name] [name], FACP  Electronically Signed    CDY/MedQ  DD: 11/11/2006  DT: 11/12/2006  Job #: [id number]   cc:   [name] Salvia, MD, [facility]

## 2010-11-27 NOTE — Assessment & Plan Note (Signed)
[facility]                             PULMONARY OFFICE NOTE   NAME:[name], [name] KLIMASZEWSKI                        MRN:          [id number]  DATE:11/24/2006                            DOB:          [birth year]    PROBLEMS:  1. Eczema.  2. History of urticaria.  3. Bronchitis.  4. Tobacco abuse.  5. Allergic rhinitis.   HISTORY:  She says rash was from hydrochlorothiazide and has been gone  since she stopped that drug. She uses latex free gloves. Nasal  congestion with mucus from the right nostril. Chest feels comfortable.  She continues to smoke.   MEDICATIONS:  1. Claritin or Benadryl.  2. Singulair.  3. Advair 250/50.  4. Protonix.  5. Lexapro 20 mg.  6. Levothroid 100 mcg.  7. Albuterol rescue inhaler.  Drug intolerant HYDROCHLOROTHIAZIDE with rash, LATEX with chest  tightness.   OBJECTIVE:  Weight 171 pounds, blood pressure 138/80, pulse regular 81,  room air saturation 97%. I question a polyp in the right nostril. There  is no post-nasal drainage. Pharynx is not red. Chest is quiet without  cough or wheeze now.   IMPRESSION:  1. Eczema resolved consistent by history with hydrochlorothiazide      allergy.  2. Latex intolerance.  3. Tobacco abuse with bronchitis.  4. Allergic rhinitis.  5. Possible nasal polyp on the right.   PLAN:  1. Smoking cessation.  2. Saline lavage.  3. EpiPen with discussion.  4. Schedule return p.r.n.     Clinton D. Maple Hudson, MD, FCCP, FACP     CDY/MedQ  DD: 11/27/2006  DT: 11/28/2006  Job #: [id number]   cc:   Dr. Duke Salvia

## 2011-02-02 ENCOUNTER — Inpatient Hospital Stay (INDEPENDENT_AMBULATORY_CARE_PROVIDER_SITE_OTHER)
Admission: RE | Admit: 2011-02-02 | Discharge: 2011-02-02 | Disposition: A | Discharge status: Home / Self Care | Payer: Self-pay | Source: Ambulatory Visit | Attending: Family Medicine | Admitting: Family Medicine

## 2011-02-02 DIAGNOSIS — N764 Abscess of vulva: Secondary | ICD-10-CM

## 2011-02-05 ENCOUNTER — Inpatient Hospital Stay (INDEPENDENT_AMBULATORY_CARE_PROVIDER_SITE_OTHER)
Admission: RE | Admit: 2011-02-05 | Discharge: 2011-02-05 | Disposition: A | Discharge status: Home / Self Care | Payer: Self-pay | Source: Ambulatory Visit | Attending: Family Medicine | Admitting: Family Medicine

## 2011-02-05 DIAGNOSIS — N9089 Other specified noninflammatory disorders of vulva and perineum: Secondary | ICD-10-CM

## 2011-03-22 IMAGING — CR DG CHEST 1V PORT
1 series · 1 of 1 positions shown · non-contrast
Comparison: 03/28/2008

CLINICAL DATA: Chest pain.  History of asthma.  Smoker.

PORTABLE CHEST - 1 VIEW

[AP]
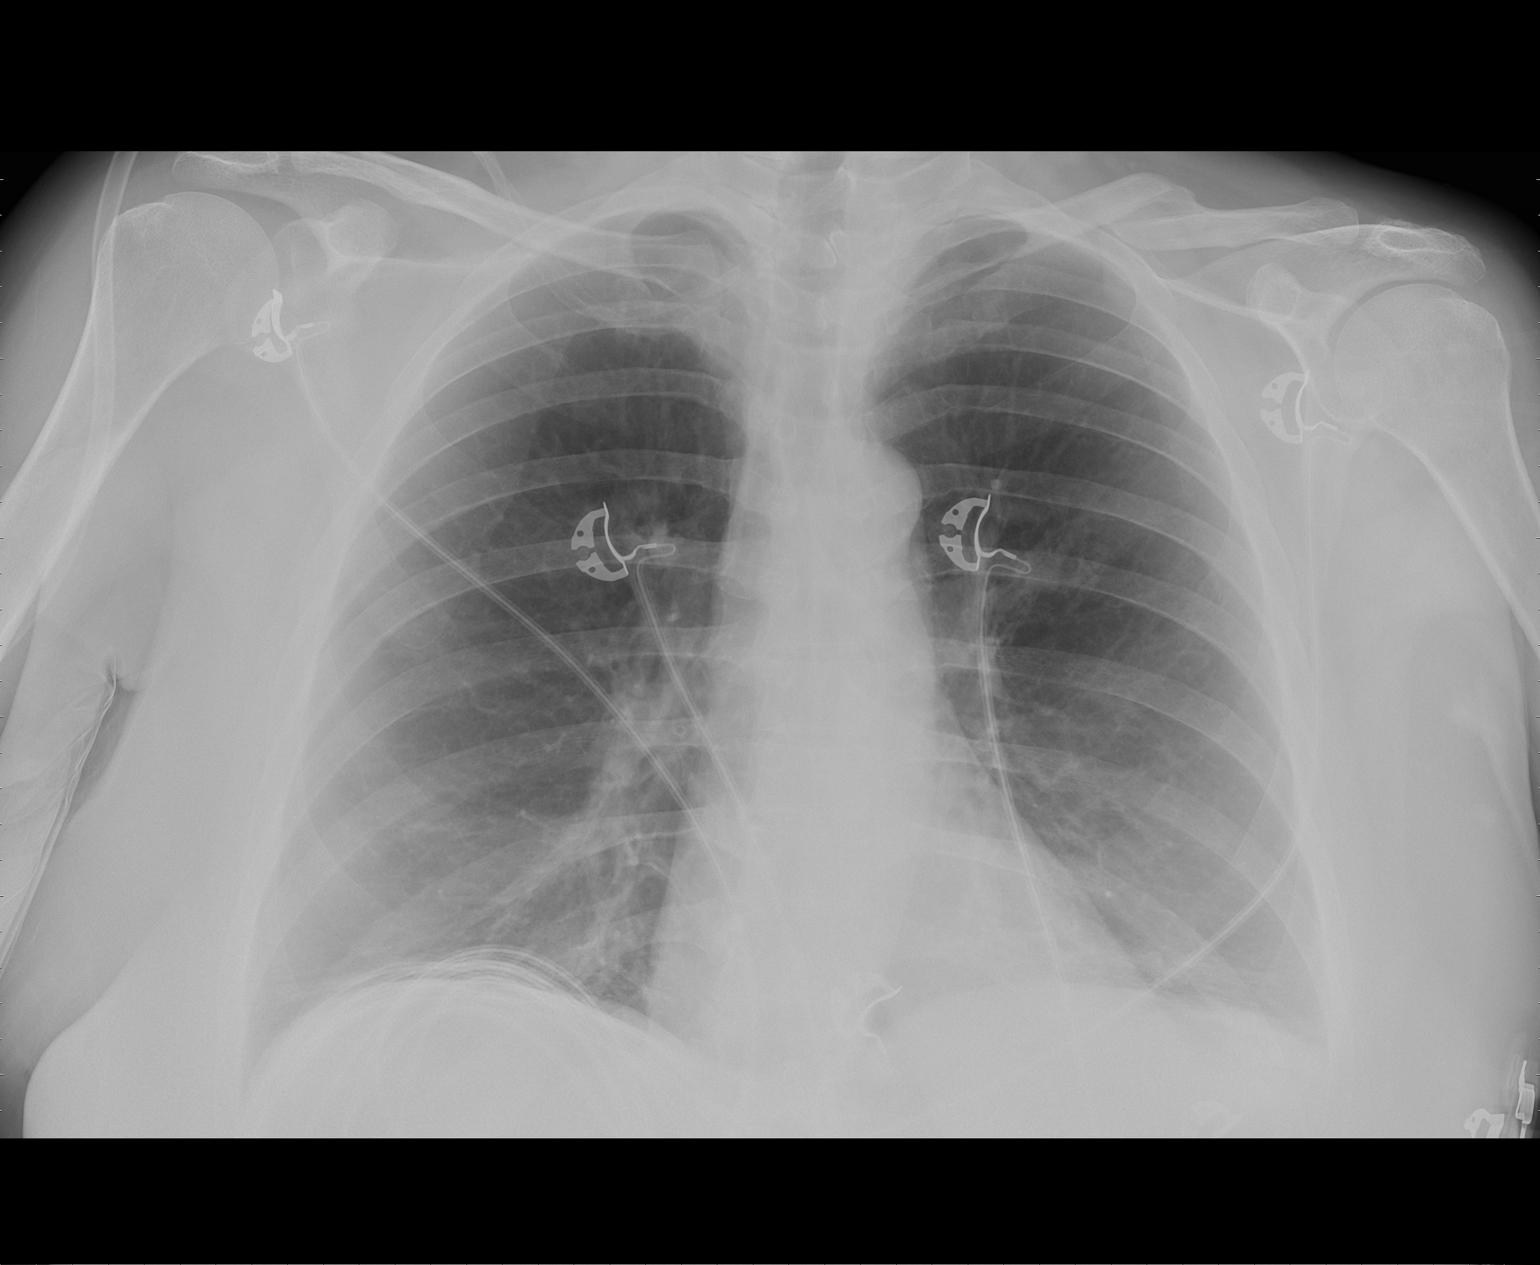

[1 of 1 positions shown; findings below may reference images not displayed]

FINDINGS: Prior exam suggesting a nodular density the right base
which is not appreciated on today's film.  Mild scarring at the
lung bases.  Mild chronic bronchitic changes.  COPD.  Normal
cardiomediastinal silhouette.  Remote healed fracture mid left
clavicle with deformity.
IMPRESSION: COPD.  No acute chest findings.

## 2011-04-08 ENCOUNTER — Inpatient Hospital Stay (INDEPENDENT_AMBULATORY_CARE_PROVIDER_SITE_OTHER)
Admission: RE | Admit: 2011-04-08 | Discharge: 2011-04-08 | Disposition: A | Discharge status: Home / Self Care | Payer: Self-pay | Source: Ambulatory Visit | Attending: Family Medicine | Admitting: Family Medicine

## 2011-04-08 DIAGNOSIS — T148XXA Other injury of unspecified body region, initial encounter: Secondary | ICD-10-CM

## 2011-05-13 ENCOUNTER — Inpatient Hospital Stay (INDEPENDENT_AMBULATORY_CARE_PROVIDER_SITE_OTHER)
Admission: RE | Admit: 2011-05-13 | Discharge: 2011-05-13 | Disposition: A | Discharge status: Home / Self Care | Payer: Self-pay | Source: Ambulatory Visit | Attending: Family Medicine | Admitting: Family Medicine

## 2011-05-13 DIAGNOSIS — J40 Bronchitis, not specified as acute or chronic: Secondary | ICD-10-CM

## 2011-05-20 ENCOUNTER — Ambulatory Visit (HOSPITAL_COMMUNITY)
Admission: RE | Admit: 2011-05-20 | Discharge: 2011-05-20 | Disposition: A | Discharge status: Home / Self Care | Payer: Self-pay | Source: Ambulatory Visit | Attending: Family Medicine | Admitting: Family Medicine

## 2011-05-20 ENCOUNTER — Other Ambulatory Visit (HOSPITAL_COMMUNITY): Payer: Self-pay | Admitting: Family Medicine

## 2011-05-20 DIAGNOSIS — J42 Unspecified chronic bronchitis: Secondary | ICD-10-CM

## 2011-05-20 DIAGNOSIS — M5431 Sciatica, right side: Secondary | ICD-10-CM

## 2011-05-20 DIAGNOSIS — M545 Low back pain, unspecified: Secondary | ICD-10-CM | POA: Insufficient documentation

## 2011-05-20 DIAGNOSIS — M25559 Pain in unspecified hip: Secondary | ICD-10-CM | POA: Insufficient documentation

## 2011-05-27 ENCOUNTER — Ambulatory Visit: Payer: Self-pay | Attending: Family Medicine

## 2011-05-27 DIAGNOSIS — M2550 Pain in unspecified joint: Secondary | ICD-10-CM | POA: Insufficient documentation

## 2011-05-27 DIAGNOSIS — IMO0001 Reserved for inherently not codable concepts without codable children: Secondary | ICD-10-CM | POA: Insufficient documentation

## 2011-05-27 DIAGNOSIS — M6281 Muscle weakness (generalized): Secondary | ICD-10-CM | POA: Insufficient documentation

## 2011-05-27 DIAGNOSIS — R269 Unspecified abnormalities of gait and mobility: Secondary | ICD-10-CM | POA: Insufficient documentation

## 2011-06-10 ENCOUNTER — Ambulatory Visit: Payer: Self-pay | Admitting: Rehabilitation

## 2011-06-11 ENCOUNTER — Other Ambulatory Visit: Payer: Self-pay | Admitting: Family Medicine

## 2011-06-13 ENCOUNTER — Encounter: Payer: Self-pay | Admitting: Rehabilitation

## 2011-06-17 ENCOUNTER — Ambulatory Visit: Payer: Self-pay | Attending: Family Medicine | Admitting: Rehabilitation

## 2011-06-17 DIAGNOSIS — M6281 Muscle weakness (generalized): Secondary | ICD-10-CM | POA: Insufficient documentation

## 2011-06-17 DIAGNOSIS — IMO0001 Reserved for inherently not codable concepts without codable children: Secondary | ICD-10-CM | POA: Insufficient documentation

## 2011-06-17 DIAGNOSIS — R269 Unspecified abnormalities of gait and mobility: Secondary | ICD-10-CM | POA: Insufficient documentation

## 2011-06-17 DIAGNOSIS — M2550 Pain in unspecified joint: Secondary | ICD-10-CM | POA: Insufficient documentation

## 2011-06-19 ENCOUNTER — Emergency Department (HOSPITAL_COMMUNITY)
Admission: EM | Admit: 2011-06-19 | Discharge: 2011-06-19 | Disposition: A | Discharge status: Home / Self Care | Payer: Self-pay | Attending: Emergency Medicine | Admitting: Emergency Medicine

## 2011-06-19 DIAGNOSIS — M545 Low back pain, unspecified: Secondary | ICD-10-CM | POA: Insufficient documentation

## 2011-06-19 DIAGNOSIS — M5430 Sciatica, unspecified side: Secondary | ICD-10-CM | POA: Insufficient documentation

## 2011-06-19 DIAGNOSIS — Z79899 Other long term (current) drug therapy: Secondary | ICD-10-CM | POA: Insufficient documentation

## 2011-06-19 DIAGNOSIS — G8929 Other chronic pain: Secondary | ICD-10-CM | POA: Insufficient documentation

## 2011-06-19 DIAGNOSIS — I252 Old myocardial infarction: Secondary | ICD-10-CM | POA: Insufficient documentation

## 2011-06-19 DIAGNOSIS — Z7982 Long term (current) use of aspirin: Secondary | ICD-10-CM | POA: Insufficient documentation

## 2011-06-19 DIAGNOSIS — I2510 Atherosclerotic heart disease of native coronary artery without angina pectoris: Secondary | ICD-10-CM | POA: Insufficient documentation

## 2011-06-19 DIAGNOSIS — M79609 Pain in unspecified limb: Secondary | ICD-10-CM | POA: Insufficient documentation

## 2011-06-19 HISTORY — DX: Other chronic pain: G89.29

## 2011-06-19 HISTORY — DX: Unspecified abdominal hernia without obstruction or gangrene: K46.9

## 2011-06-19 HISTORY — DX: Dorsalgia, unspecified: M54.9

## 2011-06-19 HISTORY — DX: Atherosclerotic heart disease of native coronary artery without angina pectoris: I25.10

## 2011-06-19 HISTORY — DX: Acute myocardial infarction, unspecified: I21.9

## 2011-06-19 MED ORDER — OXYCODONE-ACETAMINOPHEN 5-325 MG PO TABS: 1.0000 | ORAL_TABLET | Freq: Four times a day (QID) | ORAL | Status: DC | PRN

## 2011-06-19 MED ORDER — HYDROMORPHONE HCL PF 2 MG/ML IJ SOLN
2.0000 mg | Freq: Once | INTRAMUSCULAR | Status: AC
  Administered 2011-06-19: 2 mg via INTRAMUSCULAR
  Filled 2011-06-19: qty 1

## 2011-06-19 NOTE — ED Provider Notes (Signed)
History     CSN: [id number] Arrival date & time: 06/19/2011 12:10 PM   First MD Initiated Contact with Patient 06/19/11 1505      Chief Complaint  Patient presents with  . Back Pain    (Consider location/radiation/quality/duration/timing/severity/associated sxs/prior treatment) Patient is a 55 y.o. female presenting with back pain. The history is provided by the patient.  Back Pain  This is a chronic problem. The current episode started more than 1 week ago. The problem occurs constantly. The problem has been gradually worsening. The pain is associated with no known injury. The pain is present in the lumbar spine. The quality of the pain is described as shooting, aching and stabbing. The pain radiates to the right thigh and right foot. The pain is at a severity of 10/10. The pain is severe. The symptoms are aggravated by bending, twisting and certain positions. Associated symptoms include leg pain. Pertinent negatives include no chest pain, no fever, no numbness, no headaches, no abdominal pain, no bowel incontinence, no perianal numbness, no bladder incontinence, no dysuria, no paresthesias, no paresis and no tingling. Treatments tried: Current treatments include Neurontin and Klonopin and tramadol.   [facility], currently undergoing physical therapy for the back problem. She had been having symptoms for several weeks in the past week while undergoing therapy the back pain has been increasing and she's been getting greater radiation of pain down the right leg, she drags the leg when she walks however denies any foot weakness or numbness.   Referred here today for pain control. The patient has had x-rays of her back has not had an MRI yet.  Past Medical History  Diagnosis Date  . Thyroid disease   . Coronary artery disease   . Heart attack   . Hernia   . Chronic back pain     Past Surgical History  Procedure Date  . Coronary angioplasty with stent placement   .  Abdominal hysterectomy     No family history on file.  History  Substance Use Topics  . Smoking status: Current Everyday Smoker -- 0.5 packs/day  . Smokeless tobacco: Not on file  . Alcohol Use: Yes    OB History    Grav Para Term Preterm Abortions TAB SAB Ect Mult Living                  Review of Systems  Constitutional: Negative for fever.  HENT: Negative for neck pain.   Eyes: Negative for visual disturbance.  Respiratory: Negative for shortness of breath.   Cardiovascular: Negative for chest pain and leg swelling.  Gastrointestinal: Negative for nausea, vomiting, abdominal pain and bowel incontinence.  Genitourinary: Negative for bladder incontinence and dysuria.  Musculoskeletal: Positive for back pain.  Skin: Negative for rash.  Neurological: Negative for tingling, numbness, headaches and paresthesias.    Allergies  Codeine and Latex  Home Medications   Current Outpatient Rx  Name Route Sig Dispense Refill  . ALBUTEROL SULFATE HFA 108 (90 BASE) MCG/ACT IN AERS Inhalation Inhale 2 puffs into the lungs every 6 (six) hours as needed. For shortess of breath     . ASPIRIN EC 81 MG PO TBEC Oral Take 81 mg by mouth daily.      . ATORVASTATIN CALCIUM 40 MG PO TABS Oral Take 40 mg by mouth daily.      Marland Kitchen CLONAZEPAM 1 MG PO TABS Oral Take 1 mg by mouth 2 (two) times daily as needed. For anxiety     .  ESCITALOPRAM OXALATE 10 MG PO TABS Oral Take 15 mg by mouth daily.      Marland Kitchen FLUTICASONE-SALMETEROL 250-50 MCG/DOSE IN AEPB Inhalation Inhale 1 puff into the lungs every 12 (twelve) hours.      . FUROSEMIDE 40 MG PO TABS Oral Take 40 mg by mouth as needed. For swelling in the extremities     . GABAPENTIN 600 MG PO TABS Oral Take 600 mg by mouth 4 (four) times daily.      Marland Kitchen LABETALOL HCL 100 MG PO TABS Oral Take 100 mg by mouth 2 (two) times daily.      Marland Kitchen LEVOTHYROXINE SODIUM 100 MCG PO TABS Oral Take 100 mcg by mouth daily.      Marland Kitchen MONTELUKAST SODIUM 10 MG PO TABS Oral Take 10 mg  by mouth at bedtime.      . OMEPRAZOLE 20 MG PO CPDR Oral Take 20 mg by mouth daily.      . TRAMADOL HCL 50 MG PO TABS Oral Take 50 mg by mouth every 6 (six) hours as needed. For pain; Maximum dose= 8 tablets per day     . OXYCODONE-ACETAMINOPHEN 5-325 MG PO TABS Oral Take 1-2 tablets by mouth every 6 (six) hours as needed for pain. 15 tablet 0    BP 125/75  Pulse 70  Temp(Src) 98.2 F (36.8 C) (Oral)  Resp 20  Ht 5\' 2"  (1.575 m)  Wt 176 lb (79.833 kg)  BMI 32.19 kg/m2  SpO2 96%  Physical Exam  Nursing note and vitals reviewed. Constitutional: She is oriented to person, place, and time. She appears well-developed and well-nourished.  HENT:  Head: Normocephalic and atraumatic.  Mouth/Throat: Oropharynx is clear and moist.  Eyes: Conjunctivae and EOM are normal. Pupils are equal, round, and reactive to light.  Neck: Normal range of motion. Neck supple.  Cardiovascular: Normal rate, regular rhythm and normal heart sounds.   Pulmonary/Chest: Effort normal and breath sounds normal.  Abdominal: Soft. Bowel sounds are normal. There is no tenderness.  Musculoskeletal: Normal range of motion. She exhibits no edema.  Neurological: She is alert and oriented to person, place, and time. No cranial nerve deficit. She exhibits normal muscle tone. Coordination normal.       Right lower trimming the with 5 out of 5 motor strength to the foot and toes no sensory deficit.  Skin: Skin is warm. No rash noted.    ED Course  Procedures (including critical care time)  Labs Reviewed - No data to display No results found.   1. Sciatica       MDM   Patient with several week history of low back pain, undergoing therapy, and the past week the low back pain on the right side has gotten worse and started to radiate down the right leg. On today's exam there is no motor weakness and no sensory deficit however the radiation of the pain is suggestive of a right sided sciatica. No emergent reason for MRI  today, due to no significant weakness no incontinence and no involvement of the left leg. Patient can followup with her primary care provider for an outpatient MRI, which is appropriate at this time. In the emergency partner patient was treated with IM pain medicine and will be discharged home with Percocet for pain.  Suspect a history of dragging the foot is due to pain and not due to any motor weakness based on today's exam.        Shelda Jakes, MD 06/19/11 [phone]

## 2011-06-19 NOTE — ED Notes (Signed)
Pt presents with h/o low back pain.  Pt reports pain has increased after therapy on Monday.  Pt reports appointment to be re-evaluated on Thursday for pain.  Pt reports pain radiates down R leg.  Pt denies any urinary or fecal incontinence.  Pt reports dragging her R leg due to pain.

## 2011-06-20 ENCOUNTER — Ambulatory Visit: Payer: Self-pay

## 2011-06-24 ENCOUNTER — Emergency Department (HOSPITAL_COMMUNITY)
Admission: EM | Admit: 2011-06-24 | Discharge: 2011-06-24 | Disposition: A | Discharge status: Home / Self Care | Payer: Self-pay | Source: Home / Self Care | Attending: Family Medicine | Admitting: Family Medicine

## 2011-06-24 ENCOUNTER — Encounter (HOSPITAL_COMMUNITY): Payer: Self-pay | Admitting: Emergency Medicine

## 2011-06-24 DIAGNOSIS — M5430 Sciatica, unspecified side: Secondary | ICD-10-CM

## 2011-06-24 DIAGNOSIS — M545 Low back pain: Secondary | ICD-10-CM

## 2011-06-24 MED ORDER — HYDROCODONE-ACETAMINOPHEN 5-325 MG PO TABS: ORAL_TABLET | ORAL | Status: AC

## 2011-06-24 MED ORDER — KETOROLAC TROMETHAMINE 60 MG/2ML IM SOLN
60.0000 mg | Freq: Once | INTRAMUSCULAR | Status: AC
  Administered 2011-06-24: 60 mg via INTRAMUSCULAR

## 2011-06-24 MED ORDER — NAPROXEN 500 MG PO TABS: 500.0000 mg | ORAL_TABLET | Freq: Two times a day (BID) | ORAL | Status: DC

## 2011-06-24 NOTE — ED Notes (Signed)
Pt here with whole right lower extremity pain shooting down to ankle.sx constant sharp,achy pain that flared up last week.pt has hx chronic back pain,sciatica and was just released from [facility] and sent back to pcp due to inability to further treat.pt waiting on pending appt for mri from [facility].pt ran out of oxycodone today.unable to ambulate,sit or move without increased pain.

## 2011-06-24 NOTE — ED Provider Notes (Signed)
History     CSN: [id number] Arrival date & time: 06/24/2011 12:18 PM   First MD Initiated Contact with Patient 06/24/11 1243      No chief complaint on file.   (Consider location/radiation/quality/duration/timing/severity/associated sxs/prior treatment) HPI Comments: [name] [name] presents for evaluation of exacerbation of her sciatica. She is waiting for an MRI set up by her PCP. She denies numbness, weakness, or falls.  Patient is a 55 y.o. female presenting with back pain. The history is provided by the patient.  Back Pain  This is a chronic problem. The current episode started more than 1 week ago. The problem occurs constantly. The problem has not changed since onset.The pain is associated with no known injury. The pain is present in the lumbar spine. The quality of the pain is described as shooting and burning. The pain radiates to the right thigh. The pain is moderate. The symptoms are aggravated by bending, twisting and certain positions. Pertinent negatives include no numbness and no weakness.    Past Medical History  Diagnosis Date  . Thyroid disease   . Coronary artery disease   . Heart attack   . Hernia   . Chronic back pain   . Anxiety     Past Surgical History  Procedure Date  . Coronary angioplasty with stent placement   . Abdominal hysterectomy     History reviewed. No pertinent family history.  History  Substance Use Topics  . Smoking status: Current Everyday Smoker -- 0.5 packs/day  . Smokeless tobacco: Not on file  . Alcohol Use: Yes     occassional    OB History    Grav Para Term Preterm Abortions TAB SAB Ect Mult Living                  Review of Systems  Constitutional: Negative.   HENT: Negative.   Eyes: Negative.   Respiratory: Negative.   Cardiovascular: Negative.   Gastrointestinal: Negative.   Genitourinary: Negative.   Musculoskeletal: Positive for back pain.  Skin: Negative.   Neurological: Negative.  Negative for weakness and  numbness.    Allergies  Codeine and Latex  Home Medications   Current Outpatient Rx  Name Route Sig Dispense Refill  . TRAMADOL HCL 50 MG PO TABS Oral Take 50 mg by mouth every 6 (six) hours as needed. For pain; Maximum dose= 8 tablets per day     . ALBUTEROL SULFATE HFA 108 (90 BASE) MCG/ACT IN AERS Inhalation Inhale 2 puffs into the lungs every 6 (six) hours as needed. For shortess of breath     . ASPIRIN EC 81 MG PO TBEC Oral Take 81 mg by mouth daily.      . ATORVASTATIN CALCIUM 40 MG PO TABS Oral Take 40 mg by mouth daily.      Marland Kitchen CLONAZEPAM 1 MG PO TABS Oral Take 1 mg by mouth 2 (two) times daily as needed. For anxiety     . DOXYCYCLINE HYCLATE 100 MG PO CAPS Oral Take 1 capsule (100 mg total) by mouth 2 (two) times daily. 20 capsule 0  . ESCITALOPRAM OXALATE 10 MG PO TABS Oral Take 15 mg by mouth daily.      Marland Kitchen FLUTICASONE-SALMETEROL 250-50 MCG/DOSE IN AEPB Inhalation Inhale 1 puff into the lungs every 12 (twelve) hours.      . FUROSEMIDE 40 MG PO TABS Oral Take 40 mg by mouth as needed. For swelling in the extremities     . GABAPENTIN 600 MG PO TABS  Oral Take 600 mg by mouth 4 (four) times daily.      Marland Kitchen LABETALOL HCL 100 MG PO TABS Oral Take 100 mg by mouth 2 (two) times daily.      Marland Kitchen LEVOTHYROXINE SODIUM 100 MCG PO TABS Oral Take 100 mcg by mouth daily.      Marland Kitchen MONTELUKAST SODIUM 10 MG PO TABS Oral Take 10 mg by mouth at bedtime.      Marland Kitchen NAPROXEN 375 MG PO TABS Oral Take 1 tablet (375 mg total) by mouth 2 (two) times daily. 20 tablet 0  . NAPROXEN 500 MG PO TABS Oral Take 1 tablet (500 mg total) by mouth 2 (two) times daily. 30 tablet 0  . OMEPRAZOLE 20 MG PO CPDR Oral Take 20 mg by mouth daily.        BP 157/72  Pulse 78  Temp(Src) 98.1 F (36.7 C) (Oral)  Resp 20  SpO2 100%  Physical Exam  Nursing note and vitals reviewed. Constitutional: She is oriented to person, place, and time. She appears well-developed and well-nourished.  HENT:  Head: Normocephalic and atraumatic.   Eyes: EOM are normal.  Neck: Normal range of motion.  Pulmonary/Chest: Effort normal.  Musculoskeletal: Normal range of motion.       Lumbar back: She exhibits tenderness.       Back:  Neurological: She is alert and oriented to person, place, and time.  Skin: Skin is warm and dry.  Psychiatric: Her behavior is normal.    ED Course  Procedures (including critical care time)  Labs Reviewed - No data to display No results found.   1. Low back pain   2. Sciatica       MDM          Richardo Priest, MD 07/06/11 [phone]

## 2011-06-25 ENCOUNTER — Other Ambulatory Visit (HOSPITAL_COMMUNITY): Payer: Self-pay | Admitting: Family Medicine

## 2011-06-25 ENCOUNTER — Ambulatory Visit: Payer: Self-pay

## 2011-06-25 DIAGNOSIS — M549 Dorsalgia, unspecified: Secondary | ICD-10-CM

## 2011-06-27 ENCOUNTER — Ambulatory Visit: Payer: Self-pay

## 2011-07-02 ENCOUNTER — Ambulatory Visit (HOSPITAL_COMMUNITY)
Admission: RE | Admit: 2011-07-02 | Discharge: 2011-07-02 | Disposition: A | Discharge status: Home / Self Care | Payer: Self-pay | Source: Ambulatory Visit | Attending: Family Medicine | Admitting: Family Medicine

## 2011-07-02 DIAGNOSIS — M549 Dorsalgia, unspecified: Secondary | ICD-10-CM

## 2011-07-02 DIAGNOSIS — M5126 Other intervertebral disc displacement, lumbar region: Secondary | ICD-10-CM | POA: Insufficient documentation

## 2011-07-05 ENCOUNTER — Emergency Department (INDEPENDENT_AMBULATORY_CARE_PROVIDER_SITE_OTHER)
Admission: EM | Admit: 2011-07-05 | Discharge: 2011-07-05 | Disposition: A | Discharge status: Home / Self Care | Payer: Self-pay | Source: Home / Self Care | Attending: Family Medicine | Admitting: Family Medicine

## 2011-07-05 ENCOUNTER — Encounter (HOSPITAL_COMMUNITY): Payer: Self-pay | Admitting: *Deleted

## 2011-07-05 DIAGNOSIS — N898 Other specified noninflammatory disorders of vagina: Secondary | ICD-10-CM

## 2011-07-05 DIAGNOSIS — N750 Cyst of Bartholin's gland: Secondary | ICD-10-CM

## 2011-07-05 HISTORY — DX: Anxiety disorder, unspecified: F41.9

## 2011-07-05 LAB — WET PREP, GENITAL
WBC, Wet Prep HPF POC: NONE SEEN
Yeast Wet Prep HPF POC: NONE SEEN

## 2011-07-05 MED ORDER — DOXYCYCLINE HYCLATE 100 MG PO CAPS: 100.0000 mg | ORAL_CAPSULE | Freq: Two times a day (BID) | ORAL | Status: AC

## 2011-07-05 MED ORDER — NAPROXEN 375 MG PO TABS: 375.0000 mg | ORAL_TABLET | Freq: Two times a day (BID) | ORAL | Status: DC

## 2011-07-05 NOTE — ED Notes (Signed)
Pt c/o "cyst" to right labia onset 3 days ago.  STates it's as big as her thumb.  Also with "gushes" of liquid from vagina.  Wants to be checked for STD's.  Hx of these cysts in the past.

## 2011-07-05 NOTE — ED Provider Notes (Signed)
History     CSN: [id number]  Arrival date & time 07/05/11  0827   First MD Initiated Contact with Patient 07/05/11 [phone]      Chief Complaint  Patient presents with  . Vaginal Discharge  . Cyst    (Consider location/radiation/quality/duration/timing/severity/associated sxs/prior treatment) Patient is a 55 y.o. female presenting with vaginal discharge. The history is provided by the patient.  Vaginal Discharge This is a new problem. The current episode started more than 2 days ago. The problem occurs constantly. The problem has not changed since onset.The symptoms are aggravated by nothing. The symptoms are relieved by nothing. She has tried nothing for the symptoms.  States she recently started dating again and worried about an std. She also states she has a recurring cyst on her right labia. It has been draining , but never goes away.  Past Medical History  Diagnosis Date  . Thyroid disease   . Coronary artery disease   . Heart attack   . Hernia   . Chronic back pain   . Anxiety     Past Surgical History  Procedure Date  . Coronary angioplasty with stent placement   . Abdominal hysterectomy     History reviewed. No pertinent family history.  History  Substance Use Topics  . Smoking status: Current Everyday Smoker -- 0.5 packs/day  . Smokeless tobacco: Not on file  . Alcohol Use: Yes     occassional    OB History    Grav Para Term Preterm Abortions TAB SAB Ect Mult Living                  Review of Systems  Constitutional: Negative.   Respiratory: Negative.   Cardiovascular: Negative.   Gastrointestinal: Negative.   Genitourinary: Positive for vaginal discharge. Negative for dysuria, urgency, decreased urine volume, vaginal bleeding, vaginal pain, pelvic pain and dyspareunia.    Allergies  Codeine and Latex  Home Medications   Current Outpatient Rx  Name Route Sig Dispense Refill  . ALBUTEROL SULFATE HFA 108 (90 BASE) MCG/ACT IN AERS Inhalation  Inhale 2 puffs into the lungs every 6 (six) hours as needed. For shortess of breath     . ASPIRIN EC 81 MG PO TBEC Oral Take 81 mg by mouth daily.      . ATORVASTATIN CALCIUM 40 MG PO TABS Oral Take 40 mg by mouth daily.      Marland Kitchen CLONAZEPAM 1 MG PO TABS Oral Take 1 mg by mouth 2 (two) times daily as needed. For anxiety     . ESCITALOPRAM OXALATE 10 MG PO TABS Oral Take 15 mg by mouth daily.      Marland Kitchen FLUTICASONE-SALMETEROL 250-50 MCG/DOSE IN AEPB Inhalation Inhale 1 puff into the lungs every 12 (twelve) hours.      . FUROSEMIDE 40 MG PO TABS Oral Take 40 mg by mouth as needed. For swelling in the extremities     . GABAPENTIN 600 MG PO TABS Oral Take 600 mg by mouth 4 (four) times daily.      Marland Kitchen LABETALOL HCL 100 MG PO TABS Oral Take 100 mg by mouth 2 (two) times daily.      Marland Kitchen LEVOTHYROXINE SODIUM 100 MCG PO TABS Oral Take 100 mcg by mouth daily.      Marland Kitchen MONTELUKAST SODIUM 10 MG PO TABS Oral Take 10 mg by mouth at bedtime.      Marland Kitchen NAPROXEN 500 MG PO TABS Oral Take 1 tablet (500 mg total) by mouth  2 (two) times daily. 30 tablet 0  . OMEPRAZOLE 20 MG PO CPDR Oral Take 20 mg by mouth daily.      . TRAMADOL HCL 50 MG PO TABS Oral Take 50 mg by mouth every 6 (six) hours as needed. For pain; Maximum dose= 8 tablets per day     . DOXYCYCLINE HYCLATE 100 MG PO CAPS Oral Take 1 capsule (100 mg total) by mouth 2 (two) times daily. 20 capsule 0  . HYDROCODONE-ACETAMINOPHEN 5-325 MG PO TABS  Take one to two tablets every 4 to 6 hours as needed for pain 20 tablet 0    BP 129/70  Pulse 75  Temp(Src) 99.6 F (37.6 C) (Oral)  Resp 18  SpO2 97%  Physical Exam  Nursing note and vitals reviewed. Constitutional: She appears well-developed and well-nourished.  Cardiovascular: Normal rate and regular rhythm.   Pulmonary/Chest: Effort normal and breath sounds normal.  Genitourinary:       Pelvic exam with female nursing personal([place]) assisting reveals thin creamy discharge. Cervix abscent. Samples collected. She  has a resolving left labial bartholin cyst. No drainage at present.     ED Course  Procedures (including critical care time)   Labs Reviewed  GC/CHLAMYDIA PROBE AMP, GENITAL  WET PREP, GENITAL   No results found.   1. Bartholin cyst   2. Vaginal discharge       MDM          Randa Spike, MD 07/05/11 1006

## 2011-07-06 LAB — GC/CHLAMYDIA PROBE AMP, GENITAL: Chlamydia, DNA Probe: NEGATIVE

## 2011-07-24 ENCOUNTER — Other Ambulatory Visit: Payer: Self-pay | Admitting: Neurosurgery

## 2011-07-25 ENCOUNTER — Encounter (HOSPITAL_COMMUNITY): Payer: Self-pay | Admitting: Respiratory Therapy

## 2011-07-31 ENCOUNTER — Ambulatory Visit (HOSPITAL_COMMUNITY)
Admission: RE | Admit: 2011-07-31 | Discharge: 2011-07-31 | Disposition: A | Discharge status: Home / Self Care | Payer: Self-pay | Source: Ambulatory Visit | Attending: Anesthesiology | Admitting: Anesthesiology

## 2011-07-31 ENCOUNTER — Encounter (HOSPITAL_COMMUNITY): Payer: Self-pay

## 2011-07-31 ENCOUNTER — Encounter (HOSPITAL_COMMUNITY)
Admission: RE | Admit: 2011-07-31 | Discharge: 2011-07-31 | Disposition: A | Discharge status: Home / Self Care | Payer: Self-pay | Source: Ambulatory Visit | Attending: Neurosurgery | Admitting: Neurosurgery

## 2011-07-31 DIAGNOSIS — Z01812 Encounter for preprocedural laboratory examination: Secondary | ICD-10-CM | POA: Insufficient documentation

## 2011-07-31 DIAGNOSIS — Z01818 Encounter for other preprocedural examination: Secondary | ICD-10-CM | POA: Insufficient documentation

## 2011-07-31 DIAGNOSIS — R0602 Shortness of breath: Secondary | ICD-10-CM | POA: Insufficient documentation

## 2011-07-31 DIAGNOSIS — J45909 Unspecified asthma, uncomplicated: Secondary | ICD-10-CM | POA: Insufficient documentation

## 2011-07-31 HISTORY — DX: Psoriasis, unspecified: L40.9

## 2011-07-31 HISTORY — DX: Hyperlipidemia, unspecified: E78.5

## 2011-07-31 HISTORY — DX: Other difficulties with micturition: R39.198

## 2011-07-31 HISTORY — DX: Chronic obstructive pulmonary disease, unspecified: J44.9

## 2011-07-31 HISTORY — DX: Unspecified osteoarthritis, unspecified site: M19.90

## 2011-07-31 HISTORY — DX: Shortness of breath: R06.02

## 2011-07-31 HISTORY — DX: Spontaneous ecchymoses: R23.3

## 2011-07-31 HISTORY — DX: Personal history of other diseases of the digestive system: Z87.19

## 2011-07-31 HISTORY — DX: Pain in unspecified joint: M25.50

## 2011-07-31 HISTORY — DX: Hypothyroidism, unspecified: E03.9

## 2011-07-31 HISTORY — DX: Other skin changes: R23.8

## 2011-07-31 HISTORY — DX: Diverticulosis of intestine, part unspecified, without perforation or abscess without bleeding: K57.90

## 2011-07-31 HISTORY — DX: Depression, unspecified: F32.A

## 2011-07-31 HISTORY — DX: Essential (primary) hypertension: I10

## 2011-07-31 HISTORY — DX: Insomnia, unspecified: G47.00

## 2011-07-31 HISTORY — DX: Gastro-esophageal reflux disease without esophagitis: K21.9

## 2011-07-31 HISTORY — DX: Major depressive disorder, single episode, unspecified: F32.9

## 2011-07-31 LAB — BASIC METABOLIC PANEL
CO2: 26 mEq/L (ref 19–32)
Calcium: 9.2 mg/dL (ref 8.4–10.5)
Creatinine, Ser: 0.99 mg/dL (ref 0.50–1.10)
Glucose, Bld: 117 mg/dL — ABNORMAL HIGH (ref 70–99)

## 2011-07-31 LAB — URINALYSIS, ROUTINE W REFLEX MICROSCOPIC
Bilirubin Urine: NEGATIVE
Glucose, UA: NEGATIVE mg/dL
Ketones, ur: NEGATIVE mg/dL
Leukocytes, UA: NEGATIVE
Protein, ur: NEGATIVE mg/dL

## 2011-07-31 LAB — CBC
MCH: 32 pg (ref 26.0–34.0)
MCV: 96 fL (ref 78.0–100.0)
Platelets: 377 10*3/uL (ref 150–400)
RBC: 4.03 MIL/uL (ref 3.87–5.11)
RDW: 13.7 % (ref 11.5–15.5)

## 2011-07-31 LAB — DIFFERENTIAL
Eosinophils Absolute: 0.7 10*3/uL (ref 0.0–0.7)
Eosinophils Relative: 9 % — ABNORMAL HIGH (ref 0–5)
Lymphs Abs: 2.5 10*3/uL (ref 0.7–4.0)

## 2011-07-31 NOTE — Progress Notes (Signed)
Verified with Shanda Bumps at Dr.Hirsch that thigh high teds ordered

## 2011-07-31 NOTE — Progress Notes (Signed)
Pt is wheezing and coughing-cxr being done today

## 2011-07-31 NOTE — Pre-Procedure Instructions (Signed)
20 [name]  07/31/2011   Your procedure is scheduled on:  Mon, Jan 21 @ 0730  Report to [name] at 0530 AM.  Call this number if you have problems the morning of surgery: [phone]   Remember:   Do not eat food:After Midnight.  May have clear liquids: up to 4 Hours before arrival.(until 1:30 am)  Clear liquids include soda, tea, black coffee, apple or grape juice, broth.  Take these medicines the morning of surgery with A SIP OF WATER: Advair,Gapapentin,Labetalol,Synthroid,Claritin,Pain Pill(if needed)   Do not wear jewelry, make-up or nail polish.  Do not wear lotions, powders, or perfumes. You may wear deodorant.  Do not shave 48 hours prior to surgery.  Do not bring valuables to the hospital.  Contacts, dentures or bridgework may not be worn into surgery.  Leave suitcase in the car. After surgery it may be brought to your room.  For patients admitted to the hospital, checkout time is 11:00 AM the day of discharge.   Patients discharged the day of surgery will not be allowed to drive home.  Name and phone number of your driver:   Special Instructions: CHG Shower Use Special Wash: 1/2 bottle night before surgery and 1/2 bottle morning of surgery.   Please read over the following fact sheets that you were given: Pain Booklet, Coughing and Deep Breathing, MRSA Information and Surgical Site Infection Prevention

## 2011-07-31 NOTE — Progress Notes (Signed)
daugther is slow to wake up from anesthesia

## 2011-07-31 NOTE — Progress Notes (Signed)
[email or web address]@ [facility]-last visit was in Nov 12-and aware of this surgery  Stress test/echo/ekg done in Nov 2012

## 2011-08-04 MED ORDER — CEFAZOLIN SODIUM 1-5 GM-% IV SOLN: 1.0000 g | INTRAVENOUS | Status: DC

## 2011-08-04 MED ORDER — CEFAZOLIN SODIUM-DEXTROSE 2-3 GM-% IV SOLR
2.0000 g | INTRAVENOUS | Status: AC
  Administered 2011-08-05: 2 g via INTRAVENOUS

## 2011-08-05 ENCOUNTER — Encounter (HOSPITAL_COMMUNITY): Payer: Self-pay | Admitting: *Deleted

## 2011-08-05 ENCOUNTER — Encounter (HOSPITAL_COMMUNITY): Payer: Self-pay | Admitting: Anesthesiology

## 2011-08-05 ENCOUNTER — Ambulatory Visit (HOSPITAL_COMMUNITY): Payer: Self-pay

## 2011-08-05 ENCOUNTER — Ambulatory Visit (HOSPITAL_COMMUNITY): Payer: Self-pay | Admitting: Anesthesiology

## 2011-08-05 ENCOUNTER — Ambulatory Visit (HOSPITAL_COMMUNITY)
Admission: RE | Admit: 2011-08-05 | Discharge: 2011-08-07 | Disposition: A | Discharge status: Home / Self Care | Payer: Self-pay | Source: Ambulatory Visit | Attending: Neurosurgery | Admitting: Neurosurgery

## 2011-08-05 ENCOUNTER — Encounter (HOSPITAL_COMMUNITY)
Admission: RE | Disposition: A | Discharge status: Home / Self Care | Payer: Self-pay | Source: Ambulatory Visit | Attending: Neurosurgery

## 2011-08-05 DIAGNOSIS — R0602 Shortness of breath: Secondary | ICD-10-CM | POA: Insufficient documentation

## 2011-08-05 DIAGNOSIS — Z9861 Coronary angioplasty status: Secondary | ICD-10-CM | POA: Insufficient documentation

## 2011-08-05 DIAGNOSIS — I252 Old myocardial infarction: Secondary | ICD-10-CM | POA: Insufficient documentation

## 2011-08-05 DIAGNOSIS — M5106 Intervertebral disc disorders with myelopathy, lumbar region: Secondary | ICD-10-CM | POA: Insufficient documentation

## 2011-08-05 DIAGNOSIS — F3289 Other specified depressive episodes: Secondary | ICD-10-CM | POA: Insufficient documentation

## 2011-08-05 DIAGNOSIS — I10 Essential (primary) hypertension: Secondary | ICD-10-CM | POA: Insufficient documentation

## 2011-08-05 DIAGNOSIS — F411 Generalized anxiety disorder: Secondary | ICD-10-CM | POA: Insufficient documentation

## 2011-08-05 DIAGNOSIS — F17200 Nicotine dependence, unspecified, uncomplicated: Secondary | ICD-10-CM | POA: Insufficient documentation

## 2011-08-05 DIAGNOSIS — I2510 Atherosclerotic heart disease of native coronary artery without angina pectoris: Secondary | ICD-10-CM | POA: Insufficient documentation

## 2011-08-05 DIAGNOSIS — M4716 Other spondylosis with myelopathy, lumbar region: Secondary | ICD-10-CM | POA: Insufficient documentation

## 2011-08-05 DIAGNOSIS — Z23 Encounter for immunization: Secondary | ICD-10-CM | POA: Insufficient documentation

## 2011-08-05 DIAGNOSIS — K08109 Complete loss of teeth, unspecified cause, unspecified class: Secondary | ICD-10-CM | POA: Insufficient documentation

## 2011-08-05 DIAGNOSIS — F329 Major depressive disorder, single episode, unspecified: Secondary | ICD-10-CM | POA: Insufficient documentation

## 2011-08-05 HISTORY — PX: LUMBAR LAMINECTOMY/DECOMPRESSION MICRODISCECTOMY: SHX5026

## 2011-08-05 SURGERY — LUMBAR LAMINECTOMY/DECOMPRESSION MICRODISCECTOMY
Anesthesia: General | Site: Back | Laterality: Right | Wound class: Clean

## 2011-08-05 MED ORDER — METHOCARBAMOL 500 MG PO TABS
500.0000 mg | ORAL_TABLET | Freq: Four times a day (QID) | ORAL | Status: DC | PRN
  Administered 2011-08-05 – 2011-08-06 (×2): 500 mg via ORAL
  Filled 2011-08-05 (×2): qty 1

## 2011-08-05 MED ORDER — LACTATED RINGERS IV SOLN
INTRAVENOUS | Status: DC | PRN
  Administered 2011-08-05 (×2): via INTRAVENOUS

## 2011-08-05 MED ORDER — ONDANSETRON HCL 4 MG/2ML IJ SOLN
INTRAMUSCULAR | Status: DC | PRN
  Administered 2011-08-05: 4 mg via INTRAVENOUS

## 2011-08-05 MED ORDER — MIDAZOLAM HCL 5 MG/5ML IJ SOLN
INTRAMUSCULAR | Status: DC | PRN
  Administered 2011-08-05: 2 mg via INTRAVENOUS

## 2011-08-05 MED ORDER — ACETAMINOPHEN 650 MG RE SUPP: 650.0000 mg | RECTAL | Status: DC | PRN

## 2011-08-05 MED ORDER — KETOROLAC TROMETHAMINE 30 MG/ML IJ SOLN
30.0000 mg | Freq: Four times a day (QID) | INTRAMUSCULAR | Status: AC
  Administered 2011-08-05 – 2011-08-06 (×4): 30 mg via INTRAVENOUS
  Filled 2011-08-05 (×4): qty 1

## 2011-08-05 MED ORDER — HYDROCODONE-ACETAMINOPHEN 5-325 MG PO TABS: 1.0000 | ORAL_TABLET | ORAL | Status: DC | PRN

## 2011-08-05 MED ORDER — SODIUM CHLORIDE 0.9 % IV SOLN
INTRAVENOUS | Status: AC
  Filled 2011-08-05: qty 500

## 2011-08-05 MED ORDER — FLUTICASONE-SALMETEROL 250-50 MCG/DOSE IN AEPB
1.0000 | INHALATION_SPRAY | Freq: Two times a day (BID) | RESPIRATORY_TRACT | Status: DC
  Administered 2011-08-05 – 2011-08-07 (×4): 1 via RESPIRATORY_TRACT
  Filled 2011-08-05: qty 14

## 2011-08-05 MED ORDER — ROCURONIUM BROMIDE 100 MG/10ML IV SOLN
INTRAVENOUS | Status: DC | PRN
  Administered 2011-08-05: 50 mg via INTRAVENOUS

## 2011-08-05 MED ORDER — LOSARTAN POTASSIUM 50 MG PO TABS
50.0000 mg | ORAL_TABLET | Freq: Every day | ORAL | Status: DC
  Administered 2011-08-05 – 2011-08-07 (×3): 50 mg via ORAL
  Filled 2011-08-05 (×3): qty 1

## 2011-08-05 MED ORDER — SODIUM CHLORIDE 0.9 % IJ SOLN
3.0000 mL | Freq: Two times a day (BID) | INTRAMUSCULAR | Status: DC
  Administered 2011-08-05 – 2011-08-06 (×3): 3 mL via INTRAVENOUS

## 2011-08-05 MED ORDER — OXYCODONE-ACETAMINOPHEN 5-325 MG PO TABS
1.0000 | ORAL_TABLET | ORAL | Status: DC | PRN
  Administered 2011-08-05 – 2011-08-07 (×8): 2 via ORAL
  Filled 2011-08-05 (×8): qty 2

## 2011-08-05 MED ORDER — FENTANYL CITRATE 0.05 MG/ML IJ SOLN
INTRAMUSCULAR | Status: DC | PRN
  Administered 2011-08-05: 100 ug via INTRAVENOUS
  Administered 2011-08-05 (×3): 50 ug via INTRAVENOUS

## 2011-08-05 MED ORDER — HYDROXYZINE HCL 25 MG PO TABS
25.0000 mg | ORAL_TABLET | Freq: Three times a day (TID) | ORAL | Status: DC | PRN
  Filled 2011-08-05: qty 1

## 2011-08-05 MED ORDER — METHOCARBAMOL 100 MG/ML IJ SOLN
500.0000 mg | Freq: Four times a day (QID) | INTRAVENOUS | Status: DC | PRN
  Filled 2011-08-05: qty 5

## 2011-08-05 MED ORDER — SODIUM CHLORIDE 0.9 % IR SOLN
Status: DC | PRN
  Administered 2011-08-05: 08:00:00

## 2011-08-05 MED ORDER — HEMOSTATIC AGENTS (NO CHARGE) OPTIME
TOPICAL | Status: DC | PRN
  Administered 2011-08-05: 1 via TOPICAL

## 2011-08-05 MED ORDER — HYDROMORPHONE HCL PF 1 MG/ML IJ SOLN
0.2500 mg | INTRAMUSCULAR | Status: DC | PRN
  Administered 2011-08-05 (×4): 0.25 mg via INTRAVENOUS

## 2011-08-05 MED ORDER — GLYCOPYRROLATE 0.2 MG/ML IJ SOLN
INTRAMUSCULAR | Status: DC | PRN
  Administered 2011-08-05: .7 mg via INTRAVENOUS

## 2011-08-05 MED ORDER — PRASUGREL HCL 10 MG PO TABS
10.0000 mg | ORAL_TABLET | Freq: Every day | ORAL | Status: DC
  Administered 2011-08-05 – 2011-08-07 (×3): 10 mg via ORAL
  Filled 2011-08-05 (×3): qty 1

## 2011-08-05 MED ORDER — LIDOCAINE HCL (CARDIAC) 20 MG/ML IV SOLN
INTRAVENOUS | Status: DC | PRN
  Administered 2011-08-05: 60 mg via INTRAVENOUS

## 2011-08-05 MED ORDER — LORATADINE 10 MG PO TABS
10.0000 mg | ORAL_TABLET | Freq: Every day | ORAL | Status: DC
  Administered 2011-08-06 – 2011-08-07 (×2): 10 mg via ORAL
  Filled 2011-08-05 (×3): qty 1

## 2011-08-05 MED ORDER — THROMBIN 5000 UNITS EX KIT
PACK | CUTANEOUS | Status: DC | PRN
  Administered 2011-08-05 (×2): 5000 [IU] via TOPICAL

## 2011-08-05 MED ORDER — MORPHINE SULFATE 4 MG/ML IJ SOLN
1.0000 mg | INTRAMUSCULAR | Status: DC | PRN
  Administered 2011-08-05 – 2011-08-07 (×5): 4 mg via INTRAVENOUS
  Filled 2011-08-05 (×5): qty 1

## 2011-08-05 MED ORDER — BACITRACIN 50000 UNITS IM SOLR
INTRAMUSCULAR | Status: AC
  Filled 2011-08-05: qty 1

## 2011-08-05 MED ORDER — FUROSEMIDE 40 MG PO TABS
40.0000 mg | ORAL_TABLET | ORAL | Status: DC | PRN
  Filled 2011-08-05: qty 1

## 2011-08-05 MED ORDER — HYDROMORPHONE HCL PF 1 MG/ML IJ SOLN
INTRAMUSCULAR | Status: AC
  Filled 2011-08-05: qty 1

## 2011-08-05 MED ORDER — DOCUSATE SODIUM 100 MG PO CAPS
100.0000 mg | ORAL_CAPSULE | Freq: Two times a day (BID) | ORAL | Status: DC
  Administered 2011-08-05 – 2011-08-07 (×3): 100 mg via ORAL
  Filled 2011-08-05 (×3): qty 1

## 2011-08-05 MED ORDER — CYCLOBENZAPRINE HCL 10 MG PO TABS
10.0000 mg | ORAL_TABLET | Freq: Three times a day (TID) | ORAL | Status: DC | PRN
  Administered 2011-08-06 – 2011-08-07 (×2): 10 mg via ORAL
  Filled 2011-08-05 (×2): qty 1

## 2011-08-05 MED ORDER — POTASSIUM CHLORIDE IN NACL 20-0.45 MEQ/L-% IV SOLN
INTRAVENOUS | Status: DC
  Filled 2011-08-05 (×5): qty 1000

## 2011-08-05 MED ORDER — PROMETHAZINE HCL 25 MG PO TABS: 12.5000 mg | ORAL_TABLET | ORAL | Status: DC | PRN

## 2011-08-05 MED ORDER — BISACODYL 10 MG RE SUPP: 10.0000 mg | Freq: Every day | RECTAL | Status: DC | PRN

## 2011-08-05 MED ORDER — ONDANSETRON HCL 4 MG/2ML IJ SOLN: 4.0000 mg | INTRAMUSCULAR | Status: DC | PRN

## 2011-08-05 MED ORDER — PANTOPRAZOLE SODIUM 40 MG PO TBEC
40.0000 mg | DELAYED_RELEASE_TABLET | Freq: Every day | ORAL | Status: DC
  Administered 2011-08-06 – 2011-08-07 (×2): 40 mg via ORAL
  Filled 2011-08-05 (×2): qty 1

## 2011-08-05 MED ORDER — MONTELUKAST SODIUM 10 MG PO TABS
10.0000 mg | ORAL_TABLET | Freq: Every day | ORAL | Status: DC
  Administered 2011-08-05 – 2011-08-06 (×2): 10 mg via ORAL
  Filled 2011-08-05 (×3): qty 1

## 2011-08-05 MED ORDER — LEVOTHYROXINE SODIUM 100 MCG PO TABS
100.0000 ug | ORAL_TABLET | Freq: Every day | ORAL | Status: DC
  Administered 2011-08-06 – 2011-08-07 (×2): 100 ug via ORAL
  Filled 2011-08-05 (×3): qty 1

## 2011-08-05 MED ORDER — NEOSTIGMINE METHYLSULFATE 1 MG/ML IJ SOLN
INTRAMUSCULAR | Status: DC | PRN
  Administered 2011-08-05: 4 mg via INTRAVENOUS

## 2011-08-05 MED ORDER — ESCITALOPRAM OXALATE 5 MG PO TABS
15.0000 mg | ORAL_TABLET | Freq: Every day | ORAL | Status: DC
  Administered 2011-08-06 – 2011-08-07 (×2): 15 mg via ORAL
  Filled 2011-08-05 (×3): qty 1

## 2011-08-05 MED ORDER — GABAPENTIN 600 MG PO TABS
600.0000 mg | ORAL_TABLET | Freq: Four times a day (QID) | ORAL | Status: DC
  Administered 2011-08-05 – 2011-08-07 (×8): 600 mg via ORAL
  Filled 2011-08-05 (×11): qty 1

## 2011-08-05 MED ORDER — ASPIRIN 325 MG PO TABS
325.0000 mg | ORAL_TABLET | Freq: Every day | ORAL | Status: DC
  Administered 2011-08-05 – 2011-08-07 (×3): 325 mg via ORAL
  Filled 2011-08-05 (×3): qty 1

## 2011-08-05 MED ORDER — LABETALOL HCL 100 MG PO TABS
100.0000 mg | ORAL_TABLET | Freq: Two times a day (BID) | ORAL | Status: DC
  Administered 2011-08-07: 100 mg via ORAL
  Filled 2011-08-05 (×6): qty 1

## 2011-08-05 MED ORDER — CLONAZEPAM 0.5 MG PO TABS
1.0000 mg | ORAL_TABLET | Freq: Two times a day (BID) | ORAL | Status: DC | PRN
  Administered 2011-08-05 – 2011-08-06 (×2): 1 mg via ORAL
  Filled 2011-08-05 (×2): qty 2

## 2011-08-05 MED ORDER — ACETAMINOPHEN 325 MG PO TABS: 650.0000 mg | ORAL_TABLET | ORAL | Status: DC | PRN

## 2011-08-05 MED ORDER — ONDANSETRON HCL 4 MG/2ML IJ SOLN: 4.0000 mg | Freq: Once | INTRAMUSCULAR | Status: DC | PRN

## 2011-08-05 MED ORDER — ZOLPIDEM TARTRATE 5 MG PO TABS: 5.0000 mg | ORAL_TABLET | Freq: Every evening | ORAL | Status: DC | PRN

## 2011-08-05 MED ORDER — PROPOFOL 10 MG/ML IV EMUL
INTRAVENOUS | Status: DC | PRN
  Administered 2011-08-05: 10 mg via INTRAVENOUS

## 2011-08-05 MED ORDER — LIDOCAINE-EPINEPHRINE 1 %-1:100000 IJ SOLN
INTRAMUSCULAR | Status: DC | PRN
  Administered 2011-08-05: 20 mL

## 2011-08-05 MED ORDER — PROMETHAZINE HCL 25 MG/ML IJ SOLN: 12.5000 mg | INTRAMUSCULAR | Status: DC | PRN

## 2011-08-05 MED ORDER — SODIUM CHLORIDE 0.9 % IJ SOLN: 3.0000 mL | INTRAMUSCULAR | Status: DC | PRN

## 2011-08-05 MED ORDER — CEFAZOLIN SODIUM 1-5 GM-% IV SOLN
1.0000 g | Freq: Three times a day (TID) | INTRAVENOUS | Status: AC
  Administered 2011-08-05 (×2): 1 g via INTRAVENOUS
  Filled 2011-08-05 (×2): qty 50

## 2011-08-05 MED ORDER — ALBUTEROL SULFATE HFA 108 (90 BASE) MCG/ACT IN AERS
2.0000 | INHALATION_SPRAY | Freq: Four times a day (QID) | RESPIRATORY_TRACT | Status: DC | PRN
  Filled 2011-08-05: qty 6.7

## 2011-08-05 MED ORDER — MAGNESIUM HYDROXIDE 400 MG/5ML PO SUSP: 30.0000 mL | Freq: Every day | ORAL | Status: DC | PRN

## 2011-08-05 MED ORDER — 0.9 % SODIUM CHLORIDE (POUR BTL) OPTIME
TOPICAL | Status: DC | PRN
  Administered 2011-08-05: 1000 mL

## 2011-08-05 MED ORDER — NICOTINE 14 MG/24HR TD PT24
14.0000 mg | MEDICATED_PATCH | Freq: Every day | TRANSDERMAL | Status: DC
  Administered 2011-08-05 – 2011-08-07 (×3): 14 mg via TRANSDERMAL
  Filled 2011-08-05 (×3): qty 1

## 2011-08-05 MED ORDER — ROSUVASTATIN CALCIUM 20 MG PO TABS
20.0000 mg | ORAL_TABLET | Freq: Every day | ORAL | Status: DC
  Administered 2011-08-05 – 2011-08-07 (×3): 20 mg via ORAL
  Filled 2011-08-05 (×3): qty 1

## 2011-08-05 MED ORDER — INFLUENZA VIRUS VACC SPLIT PF IM SUSP
0.5000 mL | INTRAMUSCULAR | Status: AC
  Administered 2011-08-06: 0.5 mL via INTRAMUSCULAR
  Filled 2011-08-05 (×2): qty 0.5

## 2011-08-05 MED ORDER — FLUTICASONE PROPIONATE 50 MCG/ACT NA SUSP
1.0000 | Freq: Every day | NASAL | Status: DC
  Administered 2011-08-05 – 2011-08-06 (×2): 1 via NASAL
  Filled 2011-08-05: qty 16

## 2011-08-05 SURGICAL SUPPLY — 58 items
APL SKNCLS STERI-STRIP NONHPOA (GAUZE/BANDAGES/DRESSINGS) ×1
BAG DECANTER FOR FLEXI CONT (MISCELLANEOUS) ×2 IMPLANT
BENZOIN TINCTURE PRP APPL 2/3 (GAUZE/BANDAGES/DRESSINGS) ×1 IMPLANT
BLADE SURG ROTATE 9660 (MISCELLANEOUS) IMPLANT
BUR ACORN 6.0 ACORN (BURR) ×2 IMPLANT
BUR ACRON 5.0MM COATED (BURR) IMPLANT
CANISTER SUCTION 2500CC (MISCELLANEOUS) ×2 IMPLANT
CLOTH BEACON ORANGE TIMEOUT ST (SAFETY) ×2 IMPLANT
CONT SPEC 4OZ CLIKSEAL STRL BL (MISCELLANEOUS) ×1 IMPLANT
DRAPE LAPAROTOMY 100X72X124 (DRAPES) ×2 IMPLANT
DRAPE MICROSCOPE LEICA (MISCELLANEOUS) ×2 IMPLANT
DRAPE POUCH INSTRU U-SHP 10X18 (DRAPES) ×2 IMPLANT
DRAPE SURG 17X23 STRL (DRAPES) ×2 IMPLANT
DRESSING TELFA 8X3 (GAUZE/BANDAGES/DRESSINGS) ×1 IMPLANT
DURAPREP 26ML APPLICATOR (WOUND CARE) ×2 IMPLANT
ELECT REM PT RETURN 9FT ADLT (ELECTROSURGICAL) ×2
ELECTRODE REM PT RTRN 9FT ADLT (ELECTROSURGICAL) ×1 IMPLANT
GAUZE SPONGE 4X4 16PLY XRAY LF (GAUZE/BANDAGES/DRESSINGS) IMPLANT
GLOVE BIOGEL PI IND STRL 7.0 (GLOVE) IMPLANT
GLOVE BIOGEL PI IND STRL 8 (GLOVE) IMPLANT
GLOVE BIOGEL PI INDICATOR 7.0 (GLOVE) ×1
GLOVE BIOGEL PI INDICATOR 8 (GLOVE) ×1
GLOVE ECLIPSE 7.5 STRL STRAW (GLOVE) ×1 IMPLANT
GLOVE EXAM NITRILE LRG STRL (GLOVE) IMPLANT
GLOVE EXAM NITRILE MD LF STRL (GLOVE) IMPLANT
GLOVE EXAM NITRILE XL STR (GLOVE) IMPLANT
GLOVE EXAM NITRILE XS STR PU (GLOVE) IMPLANT
GLOVE SURG SS PI 6.5 STRL IVOR (GLOVE) ×2 IMPLANT
GLOVE SURG SS PI 7.5 STRL IVOR (GLOVE) ×2 IMPLANT
GOWN BRE IMP SLV AUR LG STRL (GOWN DISPOSABLE) ×2 IMPLANT
GOWN BRE IMP SLV AUR XL STRL (GOWN DISPOSABLE) ×1 IMPLANT
GOWN STRL REIN 2XL LVL4 (GOWN DISPOSABLE) ×1 IMPLANT
KIT BASIN OR (CUSTOM PROCEDURE TRAY) ×2 IMPLANT
KIT ROOM TURNOVER OR (KITS) ×2 IMPLANT
NDL HYPO 18GX1.5 BLUNT FILL (NEEDLE) IMPLANT
NDL SPNL 18GX3.5 QUINCKE PK (NEEDLE) IMPLANT
NEEDLE HYPO 18GX1.5 BLUNT FILL (NEEDLE) IMPLANT
NEEDLE HYPO 22GX1.5 SAFETY (NEEDLE) ×4 IMPLANT
NEEDLE SPNL 18GX3.5 QUINCKE PK (NEEDLE) ×2 IMPLANT
NS IRRIG 1000ML POUR BTL (IV SOLUTION) ×2 IMPLANT
PACK LAMINECTOMY NEURO (CUSTOM PROCEDURE TRAY) ×2 IMPLANT
PAD ARMBOARD 7.5X6 YLW CONV (MISCELLANEOUS) ×6 IMPLANT
PATTIES SURGICAL .75X.75 (GAUZE/BANDAGES/DRESSINGS) ×2 IMPLANT
RUBBERBAND STERILE (MISCELLANEOUS) ×4 IMPLANT
SPONGE GAUZE 4X4 12PLY (GAUZE/BANDAGES/DRESSINGS) ×1 IMPLANT
SPONGE LAP 4X18 X RAY DECT (DISPOSABLE) IMPLANT
SPONGE SURGIFOAM ABS GEL SZ50 (HEMOSTASIS) ×2 IMPLANT
STRIP CLOSURE SKIN 1/2X4 (GAUZE/BANDAGES/DRESSINGS) ×1 IMPLANT
SUT PROLENE 6 0 BV (SUTURE) IMPLANT
SUT VIC AB 0 CT1 18XCR BRD8 (SUTURE) ×1 IMPLANT
SUT VIC AB 0 CT1 8-18 (SUTURE) ×2
SUT VIC AB 2-0 CP2 18 (SUTURE) ×2 IMPLANT
SUT VIC AB 3-0 SH 8-18 (SUTURE) ×1 IMPLANT
SYR 20CC LL (SYRINGE) ×2 IMPLANT
SYR 5ML LL (SYRINGE) IMPLANT
TOWEL OR 17X24 6PK STRL BLUE (TOWEL DISPOSABLE) ×2 IMPLANT
TOWEL OR 17X26 10 PK STRL BLUE (TOWEL DISPOSABLE) ×2 IMPLANT
WATER STERILE IRR 1000ML POUR (IV SOLUTION) ×2 IMPLANT

## 2011-08-05 NOTE — Transfer of Care (Signed)
Immediate Anesthesia Transfer of Care Note  Patient: [name] [name]  Procedure(s) Performed:  LUMBAR LAMINECTOMY/DECOMPRESSION MICRODISCECTOMY - Right Lumbar four-five extraforaminal discectomy  Patient Location: PACU  Anesthesia Type: General  Level of Consciousness: awake  Airway & Oxygen Therapy: Patient Spontanous Breathing and Patient connected to face mask oxygen  Post-op Assessment: Report given to PACU RN and Post -op Vital signs reviewed and stable  Post vital signs: Reviewed and stable  Complications: No apparent anesthesia complications

## 2011-08-05 NOTE — Op Note (Signed)
08/05/2011  9:53 AM  PATIENT:  [name] [name]  56 y.o. 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 On having foraminal and far lateral disc herniation brought in for the surgery.  Patient to the operating room general anesthesia induced. Patient was placed in prone position in a Wilson frame all pressure points padded. He was placed in interspace the back x-rays obtained needle was putting at the 5 of 5 Wilson and incision was made in the midline lower lumbar spine just above the needle was incision taken the fascia hemostasis obtained with Bovie cauterization the fascia was incised and subperiosteal dissection done the spinous process and lamina of one level some contractures placed markers placed at interspace another x-ray was obtained and this showed Rx R. marker at L3-4 level. We dissect one level lower and placed a retractor placed a marker in interspace another x-ray this confirmed or positioning L4-5. Right side. Using high-speed drill and Kerrison punches a semi-hemilaminectomy medial facetectomy was performed. The limb was removed microscope was brought in for microdissection we explored the epidural space and found the disc space. Retractor the nerve roots medially and incised the Bovie disc space discectomy with pituitary rongeurs and curettes we were able to using various hooks and angled pituitaries remove the foraminal  disc herniation and extraforaminal disc herniation at from the surround. We had very good decompression of L4 and L5 roots we are at about solution hemostasis bipolar cauterization the. Retractors removed fascia closed with 0 Vicryl interrupted sutures subcutaneous tissue closed with 02 over 0 Vicryl interrupted sutures skin closed benzoin Steri-Strips dressing was placed this was placed in the supine position woken (and transferred recovery room  PLAN OF CARE: Admit for overnight observation  PATIENT DISPOSITION:  PACU - hemodynamically stable.

## 2011-08-05 NOTE — Anesthesia Preprocedure Evaluation (Addendum)
Anesthesia Evaluation  Patient identified by MRN, date of birth, ID band Patient awake    Reviewed: Allergy & Precautions, H&P , NPO status , Patient's Chart, lab work & pertinent test results  History of Anesthesia Complications (+) AWARENESS UNDER ANESTHESIANegative for: history of anesthetic complications  Airway Mallampati: II TM Distance: <3 FB Neck ROM: full    Dental  (+) Edentulous Upper and Edentulous Lower   Pulmonary shortness of breath, asthma , COPD COPD inhaler, Current Smoker,          Cardiovascular Exercise Tolerance: Poor hypertension, Pt. on medications + CAD, + Past MI and + Cardiac Stents regular Normal    Neuro/Psych PSYCHIATRIC DISORDERS Anxiety Depression Negative Neurological ROS     GI/Hepatic Neg liver ROS, hiatal hernia, GERD-  Medicated and Controlled,  Endo/Other  Negative Endocrine ROS  Renal/GU negative Renal ROS  Genitourinary negative   Musculoskeletal negative musculoskeletal ROS (+)   Abdominal   Peds  Hematology   Anesthesia Other Findings   Reproductive/Obstetrics                          Anesthesia Physical Anesthesia Plan  ASA: III  Anesthesia Plan: General ETT   Post-op Pain Management:    Induction: Intravenous  Airway Management Planned: Oral ETT  Additional Equipment:   Intra-op Plan:   Post-operative Plan: Extubation in OR  Informed Consent: I have reviewed the patients History and Physical, chart, labs and discussed the procedure including the risks, benefits and alternatives for the proposed anesthesia with the patient or authorized representative who has indicated his/her understanding and acceptance.     Plan Discussed with: Anesthesiologist, CRNA and Surgeon  Anesthesia Plan Comments:         Anesthesia Quick Evaluation

## 2011-08-05 NOTE — Preoperative (Signed)
Beta Blockers   Reason not to administer Beta Blockers:Not Applicable 

## 2011-08-05 NOTE — Anesthesia Postprocedure Evaluation (Signed)
  Anesthesia Post-op Note  Patient: [name] [name]  Procedure(s) Performed:  LUMBAR LAMINECTOMY/DECOMPRESSION MICRODISCECTOMY - Right Lumbar four-five extraforaminal discectomy  Patient Location: PACU  Anesthesia Type: General  Level of Consciousness: awake, oriented, sedated and patient cooperative  Airway and Oxygen Therapy: Patient Spontanous Breathing and Patient connected to nasal cannula oxygen  Post-op Pain: mild  Post-op Assessment: Post-op Vital signs reviewed, Patient's Cardiovascular Status Stable, Respiratory Function Stable, Patent Airway, No signs of Nausea or vomiting and Pain level controlled  Post-op Vital Signs: stable  Complications: No apparent anesthesia complications

## 2011-08-05 NOTE — Interval H&P Note (Signed)
History and Physical Interval Note:  08/05/2011 7:40 AM  [name] [name]  has presented today for surgery, with the diagnosis of Lumbar hnp with myelopathy, Lumbar radiculopathy, Lumbar spondylosis with myelopathy  The various methods of treatment have been discussed with the patient and family. After consideration of risks, benefits and other options for treatment, the patient has consented to  Procedure(s): LUMBAR LAMINECTOMY/DECOMPRESSION MICRODISCECTOMY as a surgical intervention .  The patients' history has been reviewed, patient examined, no change in status, stable for surgery.  I have reviewed the patients' chart and labs.  Questions were answered to the patient's satisfaction.     Evin Chirco R

## 2011-08-05 NOTE — H&P (Signed)
See H& P.

## 2011-08-06 ENCOUNTER — Encounter (HOSPITAL_COMMUNITY): Payer: Self-pay | Admitting: Neurosurgery

## 2011-08-06 MED ORDER — ALBUTEROL SULFATE (5 MG/ML) 0.5% IN NEBU: 2.5000 mg | INHALATION_SOLUTION | Freq: Four times a day (QID) | RESPIRATORY_TRACT | Status: DC | PRN

## 2011-08-06 MED ORDER — ALBUTEROL SULFATE (5 MG/ML) 0.5% IN NEBU
INHALATION_SOLUTION | RESPIRATORY_TRACT | Status: AC
  Filled 2011-08-06: qty 0.5

## 2011-08-06 MED ORDER — PNEUMOCOCCAL VAC POLYVALENT 25 MCG/0.5ML IJ INJ
0.5000 mL | INJECTION | INTRAMUSCULAR | Status: AC
  Administered 2011-08-07: 0.5 mL via INTRAMUSCULAR
  Filled 2011-08-06: qty 0.5

## 2011-08-06 NOTE — Evaluation (Signed)
Physical Therapy Evaluation Patient Details Name: [name] [name] MRN: [id number] DOB: [birth year] Today's Date: 08/06/2011  Problem List:  Patient Active Problem List  Diagnoses  . HYPOTHYROIDISM  . TOBACCO ABUSE  . DEPRESSION  . CHRONIC RHINITIS  . ASTHMA  . COPD  . LUNG NODULE  . ALLERGY, FOOD  . COUGH  . ABDOMINAL PAIN, LEFT LOWER QUADRANT  . NONSPECIFIC ABN FINDING RAD & OTH EXAM GI TRACT    Past Medical History:  Past Medical History  Diagnosis Date  . Thyroid disease   . Coronary artery disease   . Hernia   . Chronic back pain     herniated nucleus pulposus  . Hyperlipidemia     takes Lipitor daily  . Hypertension     takes Losartan daily and Labetalol bid  . Heart attack 2011  . Asthma   . COPD (chronic obstructive pulmonary disease)     early stages  . Shortness of breath     with exertion  . Bronchitis     couple of months ago  . Arthritis   . Joint pain   . Neck pain   . Bruises easily     pt is on Effient  . Psoriasis     elbows,knees,back  . H/O hiatal hernia   . GERD (gastroesophageal reflux disease)     takes Nexium daily  . Hemorrhoids   . Diverticulosis   . Slowing of urinary stream   . Hypothyroidism     takes Synthroid daily  . Depression     takes Klonopin daily  . Anxiety     takes Lexapro daily  . Insomnia     hydroxyzine prn   Past Surgical History:  Past Surgical History  Procedure Date  . Abdominal hysterectomy 1977  . Laparoscopy 1977    exploratory  . Tonsillectomy     as a child  . Coronary angioplasty with stent placement 2011    x 2  . Colonoscopy   . Esophagogastroduodenscoy     PT Assessment/Plan/Recommendation PT Assessment Clinical Impression Statement: 56 yo female s/p Right Lumbar four-five extraforaminal discectomy. Pt presents with decreased knee stability likely secondary to myelopathy that was present prior to surgery. Pt supervisional with bed mobility and ambulation. Pt would benefit from supervision  at home for first few days home - pt going to contact family to see if someone can stay with her. Will attempt to see pt in AM again prior to D/C.  PT Recommendation/Assessment: Patient will need skilled PT in the acute care venue PT Problem List: Decreased strength;Decreased activity tolerance;Decreased mobility;Decreased knowledge of use of DME;Pain PT Therapy Diagnosis : Difficulty walking;Abnormality of gait;Generalized weakness;Acute pain PT Plan PT Frequency: Min 6X/week PT Treatment/Interventions: Functional mobility training;Gait training;DME instruction;Therapeutic activities;Therapeutic exercise;Patient/family education;Balance training PT Recommendation Recommendations for Other Services: OT consult (Pt may benefit from OT consult to assess bath mobility) Follow Up Recommendations: Home health PT;Supervision - Intermittent PT Goals  Acute Rehab PT Goals PT Goal Formulation: With patient Time For Goal Achievement: 7 days Pt will Roll Supine to Left Side: with modified independence PT Goal: Rolling Supine to Left Side - Progress: Not met Pt will go Supine/Side to Sit: with modified independence PT Goal: Supine/Side to Sit - Progress: Not met Pt will go Sit to Supine/Side: with modified independence PT Goal: Sit to Supine/Side - Progress: Not met Pt will go Sit to Stand: with modified independence PT Goal: Sit to Stand - Progress: Not met Pt will go  Stand to Sit: with modified independence PT Goal: Stand to Sit - Progress: Not met Pt will Ambulate: >150 feet;with modified independence;with rolling walker PT Goal: Ambulate - Progress: Not met Pt will Perform Home Exercise Program: Independently PT Goal: Perform Home Exercise Program - Progress: Not met  PT Evaluation Precautions/Restrictions  Precautions Precautions: Back Precaution Booklet Issued: No (Pt given back handout) Precaution Comments: Pt taught back precautions for pain modulation.  Required Braces or Orthoses:   (none ordered, none in room, no orders for brace) Prior Functioning  Home Living Lives With:  (mother with dementia. ) Type of Home: Other (Comment) (Condo) Home Layout: One level Home Access: Level entry Bathroom Shower/Tub: Engineer, manufacturing systems: Standard Bathroom Accessibility: Yes How Accessible: Accessible via walker Home Adaptive Equipment: Bedside commode/3-in-1;Walker - rolling (pt reports her 3n1 fits in shower too. 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 Pt's mother currently living with family.  Cognition Cognition Arousal/Alertness: Awake/alert Overall Cognitive Status: Appears within functional limits for tasks assessed Orientation Level: Oriented X4 Sensation/Coordination Sensation Light Touch: Appears Intact Proprioception: Impaired by gross assessment Coordination Gross Motor Movements are Fluid and Coordinated: No Coordination and Movement Description: Pt with slight jerky movements of Bil. LEs.  Extremity Assessment RLE Assessment RLE Assessment: Exceptions to [facility] RLE AROM (degrees) Overall AROM Right Lower Extremity: Within functional limits for tasks assessed RLE Strength RLE Overall Strength: Deficits RLE Overall Strength Comments: Generalized weakness, grossly >/= 3+/5 LLE Assessment LLE Assessment: Exceptions to WFL LLE AROM (degrees) Overall AROM Left Lower Extremity: Within functional limits for tasks assessed LLE Strength LLE Overall Strength: Deficits LLE Overall Strength Comments: Generalized weakness, grossly >/= 4-/5 Mobility (including Balance) Bed Mobility Bed Mobility: Yes Rolling Right: 5: Supervision Rolling Right Details (indicate cue type and reason): Cues for sequence and log roll Rolling Left: 5: Supervision Rolling Left Details (indicate cue type and reason): Cues for sequence and log roll Left Sidelying to Sit: 5:  Supervision;HOB flat Left Sidelying to Sit Details (indicate cue type and reason): Cues for sequence and log roll, Pt very slow to upright trunk secondary to pain.  Sit to Sidelying Left: 5: Supervision Sit to Sidelying Left Details (indicate cue type and reason): Verbal cues for sequencing, precautions for pain modulation.  Transfers Transfers: Yes Sit to Stand: 5: Supervision;From bed Sit to Stand Details (indicate cue type and reason): Verbal cues for safe  UE placement, no physical assistance needed.  Stand to Sit: 5: Supervision;To bed Stand to Sit Details: Verbal cues for placement of UEs, control of descent.  Ambulation/Gait Ambulation/Gait: Yes Ambulation/Gait Assistance: 5: Supervision Ambulation/Gait Assistance Details (indicate cue type and reason): Verbal cues for safe distancing of RW, upright posture and heel strike and pt has very minimal foot clearance and dorsiflexion.  Ambulation Distance (Feet): 150 Feet Assistive device: Rolling walker Gait Pattern: Step-to pattern Stairs: No  Posture/Postural Control Posture/Postural Control: No significant limitations Balance Balance Assessed: Yes Static Sitting Balance Static Sitting - Balance Support: No upper extremity supported Static Sitting - Level of Assistance: 6: Modified independent (Device/Increase time) Static Standing Balance Static Standing - Balance Support: No upper extremity supported Static Standing - Level of Assistance: 4: Min assist;5: Stand by assistance Static Standing - Comment/# of Minutes: Stand by assist for normal stance, min assist required for narrow base of support.  Exercise  General Exercises - Lower Extremity Ankle Circles/Pumps: AROM;Both;Seated;10 reps Long Arc Quad: AROM;Both;10 reps;Seated (with  dorsiflexion at end range) End of Session PT - End of Session Equipment Utilized During Treatment: Gait belt Activity Tolerance: Patient tolerated treatment well Patient left: in bed;with call  bell in reach Nurse Communication: Mobility status for ambulation General Behavior During Session: [facility] for tasks performed Cognition: [facility] for tasks performed  Sherie Don 08/06/2011, 11:17 AM  Sherie Don) Carleene Mains PT, DPT Acute Rehabilitation [phone]

## 2011-08-06 NOTE — Progress Notes (Signed)
Doing well. C/o appropriate incisional soreness. Less leg pain No Numbness, tingling, weakness No Nausea /vomiting Amb/ voiding well  Temp:  [97.1 F (36.2 C)-99.1 F (37.3 C)] 97.6 F (36.4 C) (01/22 0741) Pulse Rate:  [63-75] 64  (01/22 0741) Resp:  [15-18] 18  (01/22 0741) BP: (86-154)/(56-84) 86/58 mmHg (01/22 0741) SpO2:  [93 %-99 %] 94 % (01/22 0741) Good strength and sensation Incision CDI  Plan: Increase activity - PT consult - prob d/c in am

## 2011-08-07 MED ORDER — OXYCODONE-ACETAMINOPHEN 5-325 MG PO TABS: 1.0000 | ORAL_TABLET | ORAL | Status: AC | PRN

## 2011-08-07 MED ORDER — CYCLOBENZAPRINE HCL 10 MG PO TABS: 10.0000 mg | ORAL_TABLET | Freq: Three times a day (TID) | ORAL | Status: AC | PRN

## 2011-08-07 NOTE — Discharge Summary (Signed)
Physician Discharge Summary  Patient ID: [name] [name] MRN: [id number] DOB/AGE: 08-07-1955 56 y.o.  Admit date: 08/05/2011 Discharge date: 08/07/2011  Admission Diagnoses:Right Lumbar four-five extraforaminal herniated nucleus pulposus      Discharge Diagnoses: Right Lumbar four-five extraforaminal herniated nucleus pulposus     Active Problems:  * No active hospital problems. *    Discharged Condition: good  Hospital Course: pt admitted on day of surgery and underwent procedure below - pt has been up ambulating - c/o incisional soreness and some continuing leg soreness - worked with pt  - will be d/c home  Consults: none  Significant Diagnostic Studies: none  Treatments: surgery: LUMBAR LAMINECTOMY/DECOMPRESSION far lateral MICRODISCECTOMY, microdisection   Discharge Exam: Blood pressure 112/66, pulse 93, temperature 97.7 F (36.5 C), temperature source Oral, resp. rate 18, SpO2 98.00%. Wound:c/d/i  Disposition: home  Discharge Orders    Future Appointments: Provider: Department: Dept Phone: Center:   08/29/2011 1:00 PM Joseph Pierini. Dorcas Carrow, CNM [facility] [phone] [facility]     Medication List  As of 08/07/2011  8:51 AM   TAKE these medications         albuterol 108 (90 BASE) MCG/ACT inhaler   Commonly known as: PROVENTIL HFA;VENTOLIN HFA   Inhale 2 puffs into the lungs every 6 (six) hours as needed. For shortess of breath      aspirin 325 MG tablet   Take 325 mg by mouth daily.      atorvastatin 40 MG tablet   Commonly known as: LIPITOR   Take 40 mg by mouth daily.      clonazePAM 1 MG tablet   Commonly known as: KLONOPIN   Take 1 mg by mouth 2 (two) times daily as needed. For anxiety      cyclobenzaprine 10 MG tablet   Commonly known as: FLEXERIL   Take 1 tablet (10 mg total) by mouth 3 (three) times daily as needed for muscle spasms.      escitalopram 10 MG tablet   Commonly known as: LEXAPRO   Take 15 mg by mouth daily.      esomeprazole 40  MG capsule   Commonly known as: NEXIUM   Take 40 mg by mouth daily before breakfast.      fish oil-omega-3 fatty acids 1000 MG capsule   Take 1 g by mouth daily.      fluticasone 50 MCG/ACT nasal spray   Commonly known as: FLONASE   Place 2 sprays into the nose daily.      Fluticasone-Salmeterol 250-50 MCG/DOSE Aepb   Commonly known as: ADVAIR   Inhale 1 puff into the lungs every 12 (twelve) hours.      furosemide 40 MG tablet   Commonly known as: LASIX   Take 40 mg by mouth as needed. For swelling in the extremities      gabapentin 600 MG tablet   Commonly known as: NEURONTIN   Take 600 mg by mouth 4 (four) times daily.      hydrOXYzine 25 MG tablet   Commonly known as: ATARAX/VISTARIL   Take 25 mg by mouth 3 (three) times daily as needed.      labetalol 100 MG tablet   Commonly known as: NORMODYNE   Take 100 mg by mouth 2 (two) times daily.      levothyroxine 100 MCG tablet   Commonly known as: SYNTHROID, LEVOTHROID   Take 100 mcg by mouth daily.      loratadine 10 MG tablet   Commonly  known as: CLARITIN   Take 10 mg by mouth daily.      losartan 50 MG tablet   Commonly known as: COZAAR   Take 50 mg by mouth daily.      montelukast 10 MG tablet   Commonly known as: SINGULAIR   Take 10 mg by mouth at bedtime.      naproxen 500 MG tablet   Commonly known as: NAPROSYN   Take 1 tablet (500 mg total) by mouth 2 (two) times daily.      naproxen 375 MG tablet   Commonly known as: NAPROSYN   Take 1 tablet (375 mg total) by mouth 2 (two) times daily.      oxyCODONE-acetaminophen 5-325 MG per tablet   Commonly known as: PERCOCET   Take 1 tablet by mouth every 8 (eight) hours as needed. For pain      oxyCODONE-acetaminophen 5-325 MG per tablet   Commonly known as: PERCOCET   Take 1-2 tablets by mouth every 4 (four) hours as needed for pain.      prasugrel 10 MG Tabs   Commonly known as: EFFIENT   Take 10 mg by mouth daily.      vitamin B-12 500 MCG tablet    Commonly known as: CYANOCOBALAMIN   Take 500 mcg by mouth daily.      zolpidem 5 MG tablet   Commonly known as: AMBIEN   Take 5 mg by mouth at bedtime as needed.             SignedClydene Fake, MD 08/07/2011, 8:51 AM

## 2011-08-07 NOTE — Progress Notes (Addendum)
Physical Therapy Treatment Patient Details Name: [name] [name] MRN: [id number] DOB: 03-10-1956 Today's Date: 08/07/2011  PT Assessment/Plan  PT - Assessment/Plan Comments on Treatment Session: 56 yo female s/p Right Lumbar four-five extraforaminal discectomy. Pt continues to present with decreased knee stability likely secondary to myelopathy that was present prior to surgery.  Pt would benefit from supervision at home for first few days home - pt reports her aunt will be staying with her and her mother. Practiced simulated entrace to tub, pt unable to perform safely with positive imbalance and inability to lift either LE enough to clear tub. Pt will benefit from tub transfer bench to promote safety and decrease risk for falls. 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   PT Plan: Discharge plan remains appropriate PT Frequency: Min 6X/week Recommendations for Other Services: OT consult (Pt may benefit from OT consult to assess bath mobility) Follow Up Recommendations: Home health PT;Supervision - Intermittent Equipment Recommended: Tub/shower bench PT Goals  Acute Rehab PT Goals Pt will go Sit to Stand: with modified independence PT Goal: Sit to Stand - Progress: Met Pt will go Stand to Sit: with modified independence PT Goal: Stand to Sit - Progress: Progressing toward goal Pt will Ambulate: >150 feet;with modified independence;with rolling walker PT Goal: Ambulate - Progress: Progressing toward goal Pt will Perform Home Exercise Program: Independently PT Goal: Perform Home Exercise Program - Progress: Progressing toward goal  PT Treatment Precautions/Restrictions  Precautions Precautions: Back Precaution Booklet Issued: No (Pt has back handout) Precaution Comments: Verbally reviewed back precautions.  Required Braces or Orthoses:  (none ordered, none in room, no orders for  brace) Restrictions Weight Bearing Restrictions: No Mobility (including Balance) Bed Mobility Bed Mobility: No (standing at start) Transfers Transfers: Yes Sit to Stand: From bed;6: Modified independent (Device/Increase time) Sit to Stand Details (indicate cue type and reason): Pt slow to stand, requires RW to obtain full standing position.  Stand to Sit: 5: Supervision;To bed Stand to Sit Details: Verbal cues for safe UE placement and control of descent.  Ambulation/Gait Ambulation/Gait: Yes Ambulation/Gait Assistance: 5: Supervision Ambulation/Gait Assistance Details (indicate cue type and reason): Supervision for safety, pt continues with minimal foot clearance. Improved upright posture.  Ambulation Distance (Feet): 50 Feet (already had ambulated with nursing this morning. ) Assistive device: Rolling walker Gait Pattern: Step-to pattern;Trunk flexed;Decreased stride length;Decreased stance time - right;Decreased dorsiflexion - right;Decreased dorsiflexion - left Stairs: No (Verbally reviewed safe LE sequence, need for assistance. )  Posture/Postural Control Posture/Postural Control: No significant limitations Balance Balance Assessed: Yes Static Sitting Balance Static Sitting - Balance Support: No upper extremity supported Static Sitting - Level of Assistance: 6: Modified independent (Device/Increase time) Static Standing Balance Static Standing - Balance Support: No upper extremity supported Static Standing - Level of Assistance: 4: Min assist Static Standing - Comment/# of Minutes: Min assist for normal stance eyes close, narrow base of support, modified tandem. All performed 30-60 sec, 3 repititions each. Pt unable to perform single limb stance secondary to weakness and pain.  Exercise  General Exercises - Lower Extremity Ankle Circles/Pumps: AROM;Both;Seated;10 reps Long Arc Quad: AROM;Both;Seated;20 reps (with dorsiflexion at end range) End of Session PT - End of  Session Equipment Utilized During Treatment: Gait belt Activity Tolerance: Patient tolerated treatment well Patient left: in bed;with call bell in reach (sitting EOB) Nurse Communication: Mobility status for ambulation (pt  reporting pain) General Behavior During Session: [facility] for tasks performed Cognition: [facility] for tasks performed  Sherie Don 08/07/2011, 8:48 AM  Sherie Don) Carleene Mains PT, DPT Acute Rehabilitation [phone]

## 2011-08-29 ENCOUNTER — Encounter: Payer: Self-pay | Admitting: Physician Assistant

## 2011-08-29 ENCOUNTER — Ambulatory Visit (INDEPENDENT_AMBULATORY_CARE_PROVIDER_SITE_OTHER): Payer: Self-pay | Admitting: Physician Assistant

## 2011-08-29 DIAGNOSIS — Z90710 Acquired absence of both cervix and uterus: Secondary | ICD-10-CM

## 2011-08-29 DIAGNOSIS — N750 Cyst of Bartholin's gland: Secondary | ICD-10-CM

## 2011-08-29 DIAGNOSIS — Z9079 Acquired absence of other genital organ(s): Secondary | ICD-10-CM

## 2011-08-29 HISTORY — DX: Cyst of Bartholin's gland: N75.0

## 2011-08-29 HISTORY — DX: Acquired absence of both cervix and uterus: Z90.710

## 2011-08-29 NOTE — Patient Instructions (Signed)
Bartholin's Cyst and Abscess Bartholin's glands produce mucus through small openings just outside the opening of the vagina. The mucus helps with lubrication around the vagina during sexual intercourse. If the duct becomes clogged, the gland will swell and cause a bulge on the inside of the vagina. If this becomes big enough, it can be seen and felt on the outside of the vagina as well. Sometimes, the swelling will shrink away by itself. However, if the cyst becomes infected, the Bartholin's cyst fills with pus and becomes more swollen, red and painful and becomes a Bartholin's abscess. This usually requires antibiotic treatment and surgical drainage. Sometimes, with minor surgery under local anesthesia, a small tube is placed in the cyst or abscess wall. This allows continued drainage for up to 6 weeks. Minor surgery can make a new opening to replace the clogged duct and help prevent future cysts or abscess. If the abscess occurs several times, a minor operation with local anesthesia is necessary to remove the Bartholin's gland completely or to make it drain better. Cutting open the gland and suturing the edges to make the opening of the gland bigger (marsupialization) may be needed and should usually be done by your obstetrician-gyncology physician. Antibiotics are usually prescribed for this condition. Take all antibiotics as prescribed. Make sure to finish them even if you are doing better. Take warm sitz baths for 20 minutes, 3 times a day. See your caregiver for follow-up care as recommended. SEEK MEDICAL CARE IF:   You have increasing pain, swelling, or redness near the vagina.   You have vomiting or inability to tolerate medicines.   You have a fever.   You have uncontrolled bleeding from the vagina.  Document Released: 08/08/2004 Document Revised: 03/13/2011 Document Reviewed: 08/11/2009 [employer] Patient Information 2012 [employer]. 

## 2011-08-29 NOTE — Progress Notes (Signed)
Chief Complaint:  Bartholin's Cyst   [name] [name] is  56 y.o. G1P1001.  No LMP recorded. Patient has had a hysterectomy..  She presents complaining of Bartholin's Cyst  Presents for evaluation of reoccurring Bartholin gland cyst, referred from [facility]. Denies s/s of cyst or abscess at present.   Obstetrical/Gynecological History: Pertinent Gynecological History: Menses: post-menopausal Bleeding: None Contraception: post menopausal status Previous GYN Procedures: Partial hysterectomy, left ovary intact No hx cancer Last mammogram: normal Date: 2012 Last pap: normal Date: 05/2011   Past Medical History: Past Medical History  Diagnosis Date  . Thyroid disease   . Coronary artery disease   . Hernia   . Chronic back pain     herniated nucleus pulposus  . Hyperlipidemia     takes Lipitor daily  . Hypertension     takes Losartan daily and Labetalol bid  . Heart attack 2011  . Asthma   . COPD (chronic obstructive pulmonary disease)     early stages  . Shortness of breath     with exertion  . Bronchitis     couple of months ago  . Arthritis   . Joint pain   . Neck pain   . Bruises easily     pt is on Effient  . Psoriasis     elbows,knees,back  . H/O hiatal hernia   . GERD (gastroesophageal reflux disease)     takes Nexium daily  . Hemorrhoids   . Diverticulosis   . Slowing of urinary stream   . Hypothyroidism     takes Synthroid daily  . Depression     takes Klonopin daily  . Anxiety     takes Lexapro daily  . Insomnia     hydroxyzine prn  . Bartholin gland cyst 08/29/2011    Past Surgical History: Past Surgical History  Procedure Date  . Abdominal hysterectomy 1977  . Laparoscopy 1977    exploratory  . Tonsillectomy     as a child  . Coronary angioplasty with stent placement 2011    x 2  . Colonoscopy   . Esophagogastroduodenscoy   . Lumbar laminectomy/decompression microdiscectomy 08/05/2011    Procedure: LUMBAR LAMINECTOMY/DECOMPRESSION  MICRODISCECTOMY;  Surgeon: Clydene Fake, MD;  Location: [facility];  Service: Neurosurgery;  Laterality: Right;  Right Lumbar four-five extraforaminal discectomy    Family History: Family History  Problem Relation Age of Onset  . Hypotension Neg Hx   . Malignant hyperthermia Neg Hx   . Pseudochol deficiency Neg Hx   . Anesthesia problems Daughter     Social History: History  Substance Use Topics  . Smoking status: Current Everyday Smoker -- 1.0 packs/day for 44 years  . Smokeless tobacco: Not on file  . Alcohol Use: No    Allergies:  Allergies  Allergen Reactions  . Other Anaphylaxis    avacdos  . Codeine     REACTION: nausea  . Latex     REACTION: shortness of breath and rash    Review of Systems - Negative except what has been reviewed in the HPI  Physical Exam   Blood pressure 120/76, pulse 70, temperature 97.5 F (36.4 C), temperature source Oral, height 5' 1.5" (1.562 m), weight 179 lb 9.6 oz (81.466 kg).  General: General appearance - alert, well appearing, and in no distress, oriented to person, place, and time and overweight Mental status - alert, oriented to person, place, and time, normal mood, behavior, speech, dress, motor activity, and thought processes, affect appropriate to  mood Focused Gynecological Exam: VULVA: normal appearing vulva with no masses, tenderness or lesions, VAGINA: normal appearing vagina with normal color and discharge, no lesions, CERVIX: surgically absent, UTERUS: surgically absent, vaginal cuff well healed, ADNEXA: no masses  Assessment: Patient Active Problem List  Diagnoses  . HYPOTHYROIDISM  . TOBACCO ABUSE  . DEPRESSION  . CHRONIC RHINITIS  . ASTHMA  . COPD  . LUNG NODULE  . ALLERGY, FOOD  . COUGH  . ABDOMINAL PAIN, LEFT LOWER QUADRANT  . NONSPECIFIC ABN FINDING RAD & OTH EXAM GI TRACT  . Bartholin gland cyst  . S/P abdominal hysterectomy and right salpingo-oophorectomy    Plan: Reviewed with patient no need for  Paps given hysterectomy with negative pathology RTC with active s/s of abscess for I&D with Ward cath or Marsupialization   Londin Antone E. 08/29/2011,2:28 PM

## 2011-09-26 ENCOUNTER — Ambulatory Visit: Payer: Self-pay | Attending: Family Medicine | Admitting: Physical Therapy

## 2011-09-26 ENCOUNTER — Ambulatory Visit: Payer: Self-pay | Admitting: Physical Therapy

## 2011-09-26 DIAGNOSIS — IMO0001 Reserved for inherently not codable concepts without codable children: Secondary | ICD-10-CM | POA: Insufficient documentation

## 2011-09-26 DIAGNOSIS — R269 Unspecified abnormalities of gait and mobility: Secondary | ICD-10-CM | POA: Insufficient documentation

## 2011-09-26 DIAGNOSIS — M2550 Pain in unspecified joint: Secondary | ICD-10-CM | POA: Insufficient documentation

## 2011-09-26 DIAGNOSIS — M6281 Muscle weakness (generalized): Secondary | ICD-10-CM | POA: Insufficient documentation

## 2011-10-02 ENCOUNTER — Ambulatory Visit: Payer: Self-pay | Admitting: Physical Therapy

## 2011-10-02 ENCOUNTER — Encounter: Payer: Self-pay | Admitting: Rehabilitation

## 2011-10-04 ENCOUNTER — Ambulatory Visit: Payer: Self-pay | Admitting: Physical Therapy

## 2011-10-07 ENCOUNTER — Ambulatory Visit: Payer: Self-pay | Admitting: Physical Therapy

## 2011-10-09 ENCOUNTER — Ambulatory Visit: Payer: Self-pay | Admitting: Rehabilitation

## 2011-10-16 ENCOUNTER — Ambulatory Visit: Payer: Self-pay | Attending: Family Medicine | Admitting: Physical Therapy

## 2011-10-16 DIAGNOSIS — IMO0001 Reserved for inherently not codable concepts without codable children: Secondary | ICD-10-CM | POA: Insufficient documentation

## 2011-10-16 DIAGNOSIS — R269 Unspecified abnormalities of gait and mobility: Secondary | ICD-10-CM | POA: Insufficient documentation

## 2011-10-16 DIAGNOSIS — M2550 Pain in unspecified joint: Secondary | ICD-10-CM | POA: Insufficient documentation

## 2011-10-16 DIAGNOSIS — M6281 Muscle weakness (generalized): Secondary | ICD-10-CM | POA: Insufficient documentation

## 2011-10-18 ENCOUNTER — Ambulatory Visit: Payer: Self-pay | Admitting: Rehabilitation

## 2011-10-22 ENCOUNTER — Ambulatory Visit: Payer: Self-pay | Admitting: Physical Therapy

## 2011-10-24 ENCOUNTER — Ambulatory Visit: Payer: Self-pay | Admitting: Physical Therapy

## 2011-10-28 ENCOUNTER — Ambulatory Visit: Payer: Self-pay | Admitting: Rehabilitation

## 2011-10-30 ENCOUNTER — Ambulatory Visit: Payer: Self-pay | Admitting: Rehabilitation

## 2012-01-07 ENCOUNTER — Encounter (HOSPITAL_COMMUNITY): Payer: Self-pay | Admitting: *Deleted

## 2012-01-07 ENCOUNTER — Emergency Department (HOSPITAL_COMMUNITY)
Admission: EM | Admit: 2012-01-07 | Discharge: 2012-01-08 | Disposition: A | Discharge status: Home / Self Care | Payer: Self-pay | Attending: Emergency Medicine | Admitting: Emergency Medicine

## 2012-01-07 DIAGNOSIS — F17200 Nicotine dependence, unspecified, uncomplicated: Secondary | ICD-10-CM | POA: Insufficient documentation

## 2012-01-07 DIAGNOSIS — L02619 Cutaneous abscess of unspecified foot: Secondary | ICD-10-CM | POA: Insufficient documentation

## 2012-01-07 DIAGNOSIS — M129 Arthropathy, unspecified: Secondary | ICD-10-CM | POA: Insufficient documentation

## 2012-01-07 DIAGNOSIS — IMO0002 Reserved for concepts with insufficient information to code with codable children: Secondary | ICD-10-CM | POA: Insufficient documentation

## 2012-01-07 DIAGNOSIS — J4489 Other specified chronic obstructive pulmonary disease: Secondary | ICD-10-CM | POA: Insufficient documentation

## 2012-01-07 DIAGNOSIS — F3289 Other specified depressive episodes: Secondary | ICD-10-CM | POA: Insufficient documentation

## 2012-01-07 DIAGNOSIS — K219 Gastro-esophageal reflux disease without esophagitis: Secondary | ICD-10-CM | POA: Insufficient documentation

## 2012-01-07 DIAGNOSIS — G4700 Insomnia, unspecified: Secondary | ICD-10-CM | POA: Insufficient documentation

## 2012-01-07 DIAGNOSIS — Z79899 Other long term (current) drug therapy: Secondary | ICD-10-CM | POA: Insufficient documentation

## 2012-01-07 DIAGNOSIS — J449 Chronic obstructive pulmonary disease, unspecified: Secondary | ICD-10-CM | POA: Insufficient documentation

## 2012-01-07 DIAGNOSIS — F329 Major depressive disorder, single episode, unspecified: Secondary | ICD-10-CM | POA: Insufficient documentation

## 2012-01-07 DIAGNOSIS — I2510 Atherosclerotic heart disease of native coronary artery without angina pectoris: Secondary | ICD-10-CM | POA: Insufficient documentation

## 2012-01-07 DIAGNOSIS — I10 Essential (primary) hypertension: Secondary | ICD-10-CM | POA: Insufficient documentation

## 2012-01-07 DIAGNOSIS — L03039 Cellulitis of unspecified toe: Secondary | ICD-10-CM | POA: Insufficient documentation

## 2012-01-07 DIAGNOSIS — F411 Generalized anxiety disorder: Secondary | ICD-10-CM | POA: Insufficient documentation

## 2012-01-07 DIAGNOSIS — E785 Hyperlipidemia, unspecified: Secondary | ICD-10-CM | POA: Insufficient documentation

## 2012-01-07 NOTE — ED Notes (Signed)
Patient with abscess to right great toe

## 2012-01-07 NOTE — ED Notes (Deleted)
Hit toe on side of door, right pinky toe wth bruising and pain. Cms intact.

## 2012-01-08 MED ORDER — SULFAMETHOXAZOLE-TRIMETHOPRIM 800-160 MG PO TABS: 1.0000 | ORAL_TABLET | Freq: Two times a day (BID) | ORAL | Status: AC

## 2012-01-08 MED ORDER — CEPHALEXIN 500 MG PO CAPS: 500.0000 mg | ORAL_CAPSULE | Freq: Four times a day (QID) | ORAL | Status: AC

## 2012-01-08 NOTE — ED Notes (Signed)
Lt great toe lesion just today ???.  Blistered area on the lateral side of the toe with redness and swelling

## 2012-01-08 NOTE — ED Provider Notes (Signed)
History     CSN: [id number]  Arrival date & time 01/07/12  2138   First MD Initiated Contact with Patient 01/07/12 2337      Chief Complaint  Patient presents with  . Toe Pain    HPI  History provided by the patient. Patient is a 56 year old female with history of hypertension, hyperlipidemia, CAD, COPD who presents with complaints of right toe injury and pain. Patient states that she hit her foot a few days ago and over the past few days developed some redness and increased swelling around her right great toe. Pain is worse with any palpation or movements. She denies having similar symptoms previously. She has tried elevating her foot and using over-the-counter pain medicine without significant improvements. Patient states she is worried about infection to the toe. She denies any numbness or tingling to the toe. She denies any erythematous streaks up the foot or ankle. She denies any fever, chills, sweats. Pain is described as severe.     Past Medical History  Diagnosis Date  . Thyroid disease   . Coronary artery disease   . Hernia   . Chronic back pain     herniated nucleus pulposus  . Hyperlipidemia     takes Lipitor daily  . Hypertension     takes Losartan daily and Labetalol bid  . Heart attack 2011  . Asthma   . COPD (chronic obstructive pulmonary disease)     early stages  . Shortness of breath     with exertion  . Bronchitis     couple of months ago  . Arthritis   . Joint pain   . Neck pain   . Bruises easily     pt is on Effient  . Psoriasis     elbows,knees,back  . H/O hiatal hernia   . GERD (gastroesophageal reflux disease)     takes Nexium daily  . Hemorrhoids   . Diverticulosis   . Slowing of urinary stream   . Hypothyroidism     takes Synthroid daily  . Depression     takes Klonopin daily  . Anxiety     takes Lexapro daily  . Insomnia     hydroxyzine prn  . Bartholin gland cyst 08/29/2011    Past Surgical History  Procedure Date  .  Abdominal hysterectomy 1977  . Laparoscopy 1977    exploratory  . Tonsillectomy     as a child  . Coronary angioplasty with stent placement 2011    x 2  . Colonoscopy   . Esophagogastroduodenscoy   . Lumbar laminectomy/decompression microdiscectomy 08/05/2011    Procedure: LUMBAR LAMINECTOMY/DECOMPRESSION MICRODISCECTOMY;  Surgeon: Clydene Fake, MD;  Location: [facility];  Service: Neurosurgery;  Laterality: Right;  Right Lumbar four-five extraforaminal discectomy    Family History  Problem Relation Age of Onset  . Hypotension Neg Hx   . Malignant hyperthermia Neg Hx   . Pseudochol deficiency Neg Hx   . Anesthesia problems Daughter     History  Substance Use Topics  . Smoking status: Current Everyday Smoker -- 1.0 packs/day for 44 years  . Smokeless tobacco: Not on file  . Alcohol Use: No    OB History    Grav Para Term Preterm Abortions TAB SAB Ect Mult Living   1 1 1  0 0 0 0 0 0 1      Review of Systems  Constitutional: Negative for fever and chills.  Musculoskeletal:       Toe pain  Neurological: Negative for weakness and numbness.    Allergies  Other; Codeine; and Latex  Home Medications   Current Outpatient Rx  Name Route Sig Dispense Refill  . ALBUTEROL SULFATE HFA 108 (90 BASE) MCG/ACT IN AERS Inhalation Inhale 2 puffs into the lungs every 6 (six) hours as needed. For shortess of breath     . ATORVASTATIN CALCIUM 40 MG PO TABS Oral Take 40 mg by mouth daily.      Marland Kitchen CLONAZEPAM 1 MG PO TABS Oral Take 1 mg by mouth 2 (two) times daily as needed. For anxiety     . ESCITALOPRAM OXALATE 10 MG PO TABS Oral Take 15 mg by mouth daily.     Marland Kitchen ESOMEPRAZOLE MAGNESIUM 40 MG PO CPDR Oral Take 40 mg by mouth daily before breakfast.    . OMEGA-3 FATTY ACIDS 1000 MG PO CAPS Oral Take 1 g by mouth daily.    Marland Kitchen FLUTICASONE PROPIONATE 50 MCG/ACT NA SUSP Nasal Place 2 sprays into the nose daily.    Marland Kitchen FLUTICASONE-SALMETEROL 250-50 MCG/DOSE IN AEPB Inhalation Inhale 1 puff  into the lungs every 12 (twelve) hours.      . FUROSEMIDE 40 MG PO TABS Oral Take 40 mg by mouth as needed. For swelling in the extremities     . GABAPENTIN 600 MG PO TABS Oral Take 600 mg by mouth 4 (four) times daily.      Marland Kitchen HYDROXYZINE HCL 25 MG PO TABS Oral Take 25 mg by mouth 3 (three) times daily.     Marland Kitchen LABETALOL HCL 100 MG PO TABS Oral Take 100 mg by mouth 2 (two) times daily.      Marland Kitchen LEVOTHYROXINE SODIUM 100 MCG PO TABS Oral Take 100 mcg by mouth daily.      Marland Kitchen LORATADINE 10 MG PO TABS Oral Take 10 mg by mouth daily.    Marland Kitchen LOSARTAN POTASSIUM 50 MG PO TABS Oral Take 50 mg by mouth daily.    Marland Kitchen MONTELUKAST SODIUM 10 MG PO TABS Oral Take 10 mg by mouth at bedtime.      Marland Kitchen NAPROXEN SODIUM 220 MG PO TABS Oral Take 220 mg by mouth 2 (two) times daily with a meal.    . OXYCODONE-ACETAMINOPHEN 5-325 MG PO TABS Oral Take 1 tablet by mouth every 8 (eight) hours as needed. For pain    . PRASUGREL HCL 10 MG PO TABS Oral Take 10 mg by mouth daily.    Marland Kitchen VITAMIN B-12 500 MCG PO TABS Oral Take 500 mcg by mouth daily.    Marland Kitchen ZOLPIDEM TARTRATE 5 MG PO TABS Oral Take 5 mg by mouth at bedtime as needed. For sleep      BP 134/70  Temp 98.9 F (37.2 C) (Oral)  Resp 18  SpO2 96%  Physical Exam  Nursing note and vitals reviewed. Constitutional: She is oriented to person, place, and time. She appears well-developed and well-nourished. No distress.  HENT:  Head: Normocephalic.  Cardiovascular: Normal rate and regular rhythm.   Pulmonary/Chest: Effort normal and breath sounds normal.  Neurological: She is alert and oriented to person, place, and time.  Skin: Skin is warm and dry.       Erythema to the dorsal proximal right great toe with slight fluctuance in the central pustule. Tenderness to palpation. Normal distal sensations and cap refill. No erythematous streaks. No circumferential erythema or swelling.  Psychiatric: She has a normal mood and affect. Her behavior is normal.    ED Course  Procedures  INCISION AND DRAINAGE Performed by: Angus Seller Consent: Verbal consent obtained. Risks and benefits: risks, benefits and alternatives were discussed Type: abscess  Body area: Right great toe  Anesthesia: local infiltration  Local anesthetic: lidocaine 2% with epinephrine  Anesthetic total: 1 ml  Complexity: complex Blunt dissection to break up loculations  Drainage: purulent  Drainage amount: Very small and thick    Packing material: None   Patient tolerance: Patient tolerated the procedure well with no immediate complications.       1. Cellulitis and abscess of foot       MDM  Patient seen and evaluated. Patient no acute distress.        Angus Seller, [place] 01/08/12 [phone]

## 2012-01-08 NOTE — Discharge Instructions (Signed)
You were seen and treated for your toe pain and swelling. At this time your providers feel you have a small infection to the skin of your toe. A small incision was made to help drain the pus. Please use a warm compress over the toe 45 times a day to help increase blood flow to fight the infection. Use antibiotics are prescribed as instructed for the full length of time. Please followup with a primary care provider or return to emergency room in 48 hours have a recheck of your symptoms. Return sooner if you develop any fever, chills, sweats.    Cellulitis Cellulitis is an infection of the skin and the tissue beneath it. The area is typically red and tender. It is caused by germs (bacteria) (usually staph or strep) that enter the body through cuts or sores. Cellulitis most commonly occurs in the arms or lower legs.  HOME CARE INSTRUCTIONS   If you are given a prescription for medications which kill germs (antibiotics), take as directed until finished.   If the infection is on the arm or leg, keep the limb elevated as able.   Use a warm cloth several times per day to relieve pain and encourage healing.   See your caregiver for recheck of the infected site as directed if problems arise.   Only take over-the-counter or prescription medicines for pain, discomfort, or fever as directed by your caregiver.  SEEK MEDICAL CARE IF:   The area of redness (inflammation) is spreading, there are red streaks coming from the infected site, or if a part of the infection begins to turn dark in color.   The joint or bone underneath the infected skin becomes painful after the skin has healed.   The infection returns in the same or another area after it seems to have gone away.   A boil or bump swells up. This may be an abscess.   New, unexplained problems such as pain or fever develop.  SEEK IMMEDIATE MEDICAL CARE IF:   You have a fever.   You or your child feels drowsy or lethargic.   There is vomiting,  diarrhea, or lasting discomfort or feeling ill (malaise) with muscle aches and pains.  MAKE SURE YOU:   Understand these instructions.   Will watch your condition.   Will get help right away if you are not doing well or get worse.  Document Released: 04/10/2005 Document Revised: 06/20/2011 Document Reviewed: 02/17/2008 [facility] Patient Information 2012 [place], [place].    Abscess An abscess (boil or furuncle) is an infected area that contains a collection of pus.  SYMPTOMS Signs and symptoms of an abscess include pain, tenderness, redness, or hardness. You may feel a moveable soft area under your skin. An abscess can occur anywhere in the body.  TREATMENT  A surgical cut (incision) may be made over your abscess to drain the pus. Gauze may be packed into the space or a drain may be looped through the abscess cavity (pocket). This provides a drain that will allow the cavity to heal from the inside outwards. The abscess may be painful for a few days, but should feel much better if it was drained.  Your abscess, if seen early, may not have localized and may not have been drained. If not, another appointment may be required if it does not get better on its own or with medications. HOME CARE INSTRUCTIONS   Only take over-the-counter or prescription medicines for pain, discomfort, or fever as directed by your caregiver.  Take your antibiotics as directed if they were prescribed. Finish them even if you start to feel better.   Keep the skin and clothes clean around your abscess.   If the abscess was drained, you will need to use gauze dressing to collect any draining pus. Dressings will typically need to be changed 3 or more times a day.   The infection may spread by skin contact with others. Avoid skin contact as much as possible.   Practice good hygiene. This includes regular hand washing, cover any draining skin lesions, and do not share personal care items.   If you participate in  sports, do not share athletic equipment, towels, whirlpools, or personal care items. Shower after every practice or tournament.   If a draining area cannot be adequately covered:   Do not participate in sports.   Children should not participate in day care until the wound has healed or drainage stops.   If your caregiver has given you a follow-up appointment, it is very important to keep that appointment. Not keeping the appointment could result in a much worse infection, chronic or permanent injury, pain, and disability. If there is any problem keeping the appointment, you must call back to this facility for assistance.  SEEK MEDICAL CARE IF:   You develop increased pain, swelling, redness, drainage, or bleeding in the wound site.   You develop signs of generalized infection including muscle aches, chills, fever, or a general ill feeling.   You have an oral temperature above 102 F (38.9 C).  MAKE SURE YOU:   Understand these instructions.   Will watch your condition.   Will get help right away if you are not doing well or get worse.  Document Released: 04/10/2005 Document Revised: 06/20/2011 Document Reviewed: 02/02/2008 [facility] Patient Information 2012 [place], [place].

## 2012-01-10 NOTE — ED Provider Notes (Signed)
Medical screening examination/treatment/procedure(s) were performed by non-physician practitioner and as supervising physician I was immediately available for consultation/collaboration.  Nishan Ovens R. Andra Matsuo, MD 01/10/12 0653 

## 2012-01-20 ENCOUNTER — Other Ambulatory Visit (HOSPITAL_COMMUNITY): Payer: Self-pay | Admitting: Family Medicine

## 2012-01-20 DIAGNOSIS — R1310 Dysphagia, unspecified: Secondary | ICD-10-CM

## 2012-01-24 ENCOUNTER — Ambulatory Visit (HOSPITAL_COMMUNITY)
Admission: RE | Admit: 2012-01-24 | Discharge: 2012-01-24 | Disposition: A | Discharge status: Home / Self Care | Payer: Self-pay | Source: Ambulatory Visit | Attending: Family Medicine | Admitting: Family Medicine

## 2012-01-24 DIAGNOSIS — R1310 Dysphagia, unspecified: Secondary | ICD-10-CM | POA: Insufficient documentation

## 2012-04-12 ENCOUNTER — Encounter (HOSPITAL_COMMUNITY): Payer: Self-pay | Admitting: *Deleted

## 2012-04-12 ENCOUNTER — Emergency Department (HOSPITAL_COMMUNITY): Payer: No Typology Code available for payment source

## 2012-04-12 ENCOUNTER — Emergency Department (HOSPITAL_COMMUNITY)
Admission: EM | Admit: 2012-04-12 | Discharge: 2012-04-12 | Disposition: A | Discharge status: Home / Self Care | Payer: No Typology Code available for payment source | Attending: Emergency Medicine | Admitting: Emergency Medicine

## 2012-04-12 DIAGNOSIS — I2510 Atherosclerotic heart disease of native coronary artery without angina pectoris: Secondary | ICD-10-CM | POA: Insufficient documentation

## 2012-04-12 DIAGNOSIS — S0003XA Contusion of scalp, initial encounter: Secondary | ICD-10-CM | POA: Insufficient documentation

## 2012-04-12 DIAGNOSIS — S139XXA Sprain of joints and ligaments of unspecified parts of neck, initial encounter: Secondary | ICD-10-CM | POA: Insufficient documentation

## 2012-04-12 DIAGNOSIS — J4489 Other specified chronic obstructive pulmonary disease: Secondary | ICD-10-CM | POA: Insufficient documentation

## 2012-04-12 DIAGNOSIS — M79609 Pain in unspecified limb: Secondary | ICD-10-CM | POA: Insufficient documentation

## 2012-04-12 DIAGNOSIS — I10 Essential (primary) hypertension: Secondary | ICD-10-CM | POA: Insufficient documentation

## 2012-04-12 DIAGNOSIS — R51 Headache: Secondary | ICD-10-CM | POA: Insufficient documentation

## 2012-04-12 DIAGNOSIS — T1490XA Injury, unspecified, initial encounter: Secondary | ICD-10-CM | POA: Insufficient documentation

## 2012-04-12 DIAGNOSIS — Z79899 Other long term (current) drug therapy: Secondary | ICD-10-CM | POA: Insufficient documentation

## 2012-04-12 DIAGNOSIS — S0083XA Contusion of other part of head, initial encounter: Secondary | ICD-10-CM | POA: Insufficient documentation

## 2012-04-12 DIAGNOSIS — J449 Chronic obstructive pulmonary disease, unspecified: Secondary | ICD-10-CM | POA: Insufficient documentation

## 2012-04-12 DIAGNOSIS — M542 Cervicalgia: Secondary | ICD-10-CM | POA: Insufficient documentation

## 2012-04-12 LAB — CBC
HCT: 41.3 % (ref 36.0–46.0)
MCH: 32.2 pg (ref 26.0–34.0)
MCHC: 33.4 g/dL (ref 30.0–36.0)
MCV: 96.3 fL (ref 78.0–100.0)
Platelets: 384 10*3/uL (ref 150–400)
RDW: 13.8 % (ref 11.5–15.5)

## 2012-04-12 LAB — BASIC METABOLIC PANEL
BUN: 13 mg/dL (ref 6–23)
Calcium: 9.3 mg/dL (ref 8.4–10.5)
Chloride: 100 mEq/L (ref 96–112)
Creatinine, Ser: 0.78 mg/dL (ref 0.50–1.10)
GFR calc Af Amer: >90 mL/min (ref >90)
GFR calc non Af Amer: >90 mL/min (ref >90)

## 2012-04-12 MED ORDER — HYDROMORPHONE HCL PF 1 MG/ML IJ SOLN
1.0000 mg | Freq: Once | INTRAMUSCULAR | Status: AC
  Administered 2012-04-12: 1 mg via INTRAVENOUS

## 2012-04-12 MED ORDER — HYDROMORPHONE HCL PF 1 MG/ML IJ SOLN
INTRAMUSCULAR | Status: AC
  Filled 2012-04-12: qty 1

## 2012-04-12 MED ORDER — SODIUM CHLORIDE 0.9 % IV BOLUS (SEPSIS)
1000.0000 mL | Freq: Once | INTRAVENOUS | Status: AC
  Administered 2012-04-12: 1000 mL via INTRAVENOUS

## 2012-04-12 MED ORDER — HYDROMORPHONE HCL PF 1 MG/ML IJ SOLN
1.0000 mg | Freq: Once | INTRAMUSCULAR | Status: AC
  Administered 2012-04-12: 1 mg via INTRAVENOUS
  Filled 2012-04-12: qty 1

## 2012-04-12 NOTE — ED Notes (Signed)
Pt taken to restroom in wheelchair. Pt tolerated well
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
## 2012-04-12 NOTE — ED Notes (Signed)
[name] [name] to call for pt.'s Daughter [name] Bound in the waiting room but got no answer.  Pt. Made aware.  Pt. Called her family.

## 2012-04-12 NOTE — ED Notes (Signed)
Patient given ice pack for head. Instructed patient to keep pack on head for 15-20 minutes and then remove.

## 2012-04-12 NOTE — ED Notes (Signed)
She is also complaining of right elbow and right knee pain
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
## 2012-04-14 NOTE — ED Provider Notes (Signed)
I saw and evaluated the patient, reviewed the resident's note and I agree with the findings and plan.   .Face to face Exam:  General:  Awake HEENT:  Atraumatic Resp:  Normal effort Abd:  Nondistended Neuro:No focal weakness Lymph: No adenopathy   Nelia Shi, MD 04/14/12 2015

## 2012-04-16 ENCOUNTER — Emergency Department (HOSPITAL_COMMUNITY): Payer: No Typology Code available for payment source

## 2012-04-16 ENCOUNTER — Emergency Department (HOSPITAL_COMMUNITY)
Admission: EM | Admit: 2012-04-16 | Discharge: 2012-04-16 | Disposition: A | Discharge status: Home / Self Care | Payer: No Typology Code available for payment source | Attending: Emergency Medicine | Admitting: Emergency Medicine

## 2012-04-16 ENCOUNTER — Encounter (HOSPITAL_COMMUNITY): Payer: Self-pay | Admitting: Physical Medicine and Rehabilitation

## 2012-04-16 DIAGNOSIS — R51 Headache: Secondary | ICD-10-CM

## 2012-04-16 DIAGNOSIS — R42 Dizziness and giddiness: Secondary | ICD-10-CM | POA: Insufficient documentation

## 2012-04-16 DIAGNOSIS — J4489 Other specified chronic obstructive pulmonary disease: Secondary | ICD-10-CM | POA: Insufficient documentation

## 2012-04-16 DIAGNOSIS — I2510 Atherosclerotic heart disease of native coronary artery without angina pectoris: Secondary | ICD-10-CM | POA: Insufficient documentation

## 2012-04-16 DIAGNOSIS — Z79899 Other long term (current) drug therapy: Secondary | ICD-10-CM | POA: Insufficient documentation

## 2012-04-16 DIAGNOSIS — H538 Other visual disturbances: Secondary | ICD-10-CM | POA: Insufficient documentation

## 2012-04-16 DIAGNOSIS — E079 Disorder of thyroid, unspecified: Secondary | ICD-10-CM | POA: Insufficient documentation

## 2012-04-16 DIAGNOSIS — J449 Chronic obstructive pulmonary disease, unspecified: Secondary | ICD-10-CM | POA: Insufficient documentation

## 2012-04-16 LAB — CBC WITH DIFFERENTIAL/PLATELET
Eosinophils Absolute: 0.1 10*3/uL (ref 0.0–0.7)
Eosinophils Relative: 2 % (ref 0–5)
Lymphs Abs: 3.1 10*3/uL (ref 0.7–4.0)
MCH: 32.2 pg (ref 26.0–34.0)
MCV: 96.9 fL (ref 78.0–100.0)
Platelets: 354 10*3/uL (ref 150–400)
RBC: 3.85 MIL/uL — ABNORMAL LOW (ref 3.87–5.11)

## 2012-04-16 LAB — BASIC METABOLIC PANEL
BUN: 14 mg/dL (ref 6–23)
Calcium: 9.6 mg/dL (ref 8.4–10.5)
Creatinine, Ser: 0.77 mg/dL (ref 0.50–1.10)
GFR calc non Af Amer: >90 mL/min (ref >90)
Glucose, Bld: 108 mg/dL — ABNORMAL HIGH (ref 70–99)
Sodium: 139 mEq/L (ref 135–145)

## 2012-04-16 MED ORDER — METOCLOPRAMIDE HCL 5 MG/ML IJ SOLN
10.0000 mg | Freq: Once | INTRAMUSCULAR | Status: AC
  Administered 2012-04-16: 10 mg via INTRAVENOUS
  Filled 2012-04-16: qty 2

## 2012-04-16 MED ORDER — HYDROMORPHONE HCL PF 1 MG/ML IJ SOLN
1.0000 mg | Freq: Once | INTRAMUSCULAR | Status: AC
  Administered 2012-04-16: 1 mg via INTRAVENOUS
  Filled 2012-04-16: qty 1

## 2012-04-16 MED ORDER — DIPHENHYDRAMINE HCL 50 MG/ML IJ SOLN
25.0000 mg | Freq: Once | INTRAMUSCULAR | Status: AC
  Administered 2012-04-16: 25 mg via INTRAVENOUS
  Filled 2012-04-16: qty 1

## 2012-04-16 MED ORDER — OXYCODONE-ACETAMINOPHEN 5-325 MG PO TABS: 2.0000 | ORAL_TABLET | ORAL | Status: DC | PRN

## 2012-04-16 MED ORDER — PROMETHAZINE HCL 25 MG PO TABS: 25.0000 mg | ORAL_TABLET | Freq: Four times a day (QID) | ORAL | Status: DC | PRN
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
## 2012-04-16 NOTE — ED Notes (Signed)
Pt presents to department for evaluation of headache and blurred vision. Also states L sided grip weakness and facial asymmetry. Upon arrival pt states 10/10 headache. Strong equal bilateral grip strengths. No facial droop noted. Pt is conscious alert and oriented x4.

## 2012-04-16 NOTE — ED Provider Notes (Signed)
Medical screening examination/treatment/procedure(s) were performed by non-physician practitioner and as supervising physician I was immediately available for consultation/collaboration.   David H Yao, MD 04/16/12 2325 
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
## 2012-05-08 ENCOUNTER — Emergency Department (INDEPENDENT_AMBULATORY_CARE_PROVIDER_SITE_OTHER): Payer: Self-pay

## 2012-05-08 ENCOUNTER — Emergency Department (HOSPITAL_COMMUNITY)
Admission: EM | Admit: 2012-05-08 | Discharge: 2012-05-08 | Disposition: A | Discharge status: Home / Self Care | Payer: Self-pay | Source: Home / Self Care

## 2012-05-08 ENCOUNTER — Encounter (HOSPITAL_COMMUNITY): Payer: Self-pay | Admitting: *Deleted

## 2012-05-08 DIAGNOSIS — F17200 Nicotine dependence, unspecified, uncomplicated: Secondary | ICD-10-CM

## 2012-05-08 DIAGNOSIS — J438 Other emphysema: Secondary | ICD-10-CM

## 2012-05-08 DIAGNOSIS — J069 Acute upper respiratory infection, unspecified: Secondary | ICD-10-CM

## 2012-05-08 DIAGNOSIS — J439 Emphysema, unspecified: Secondary | ICD-10-CM

## 2012-05-08 DIAGNOSIS — J9801 Acute bronchospasm: Secondary | ICD-10-CM

## 2012-05-08 DIAGNOSIS — Z720 Tobacco use: Secondary | ICD-10-CM

## 2012-05-08 MED ORDER — ALBUTEROL SULFATE (5 MG/ML) 0.5% IN NEBU
2.5000 mg | INHALATION_SOLUTION | Freq: Once | RESPIRATORY_TRACT | Status: AC
  Administered 2012-05-08: 2.5 mg via RESPIRATORY_TRACT

## 2012-05-08 MED ORDER — TRIAMCINOLONE ACETONIDE 40 MG/ML IJ SUSP
INTRAMUSCULAR | Status: AC
  Filled 2012-05-08: qty 5

## 2012-05-08 MED ORDER — ALBUTEROL SULFATE (5 MG/ML) 0.5% IN NEBU
INHALATION_SOLUTION | RESPIRATORY_TRACT | Status: AC
  Filled 2012-05-08: qty 0.5

## 2012-05-08 MED ORDER — IPRATROPIUM BROMIDE 0.02 % IN SOLN
0.5000 mg | Freq: Once | RESPIRATORY_TRACT | Status: AC
  Administered 2012-05-08: 0.5 mg via RESPIRATORY_TRACT

## 2012-05-08 MED ORDER — TRIAMCINOLONE ACETONIDE 40 MG/ML IJ SUSP
40.0000 mg | Freq: Once | INTRAMUSCULAR | Status: AC
  Administered 2012-05-08: 40 mg via INTRAMUSCULAR

## 2012-05-08 MED ORDER — METHYLPREDNISOLONE 4 MG PO KIT: PACK | ORAL | Status: DC

## 2012-05-08 NOTE — ED Notes (Signed)
Pt reports cough, congestion, drainage for several days.

## 2012-05-08 NOTE — ED Provider Notes (Signed)
History     CSN: [id number]  Arrival date & time 05/08/12  1248   None     Chief Complaint  Patient presents with  . Cough  . URI    (Consider location/radiation/quality/duration/timing/severity/associated sxs/prior treatment) HPI Comments: 56 year old female appears 10 years older than her stated age presents with complaints of upper respiratory infection symptoms associated with asthma exacerbation, wheezes and cough. She also has PND a sore throat and congestion in the chest. Associated with her cough is pain in the left lateral ribs. The symptoms started approximately 5 days ago. She smokes one half pack per day at this time but has smoked for 40 years.  Patient is a 56 y.o. female presenting with cough and URI.  Cough Associated symptoms include rhinorrhea, sore throat, shortness of breath and wheezing. Pertinent negatives include no chills.  URI The primary symptoms include fatigue, sore throat, cough and wheezing. Primary symptoms do not include fever or rash.  Symptoms associated with the illness include congestion and rhinorrhea. The illness is not associated with chills.    Past Medical History  Diagnosis Date  . Thyroid disease   . Coronary artery disease   . Hernia   . Chronic back pain     herniated nucleus pulposus  . Hyperlipidemia     takes Lipitor daily  . Hypertension     takes Losartan daily and Labetalol bid  . Heart attack 2011  . Asthma   . COPD (chronic obstructive pulmonary disease)     early stages  . Shortness of breath     with exertion  . Bronchitis     couple of months ago  . Arthritis   . Joint pain   . Neck pain   . Bruises easily     pt is on Effient  . Psoriasis     elbows,knees,back  . H/O hiatal hernia   . GERD (gastroesophageal reflux disease)     takes Nexium daily  . Hemorrhoids   . Diverticulosis   . Slowing of urinary stream   . Hypothyroidism     takes Synthroid daily  . Depression     takes Klonopin daily  .  Anxiety     takes Lexapro daily  . Insomnia     hydroxyzine prn  . Bartholin gland cyst 08/29/2011    Past Surgical History  Procedure Date  . Abdominal hysterectomy 1977  . Laparoscopy 1977    exploratory  . Tonsillectomy     as a child  . Coronary angioplasty with stent placement 2011    x 2  . Colonoscopy   . Esophagogastroduodenscoy   . Lumbar laminectomy/decompression microdiscectomy 08/05/2011    Procedure: LUMBAR LAMINECTOMY/DECOMPRESSION MICRODISCECTOMY;  Surgeon: Clydene Fake, MD;  Location: [facility];  Service: Neurosurgery;  Laterality: Right;  Right Lumbar four-five extraforaminal discectomy    Family History  Problem Relation Age of Onset  . Hypotension Neg Hx   . Malignant hyperthermia Neg Hx   . Pseudochol deficiency Neg Hx   . Anesthesia problems Daughter     History  Substance Use Topics  . Smoking status: Current Every Day Smoker -- 1.0 packs/day for 44 years  . Smokeless tobacco: Not on file  . Alcohol Use: No    OB History    Grav Para Term Preterm Abortions TAB SAB Ect Mult Living   1 1 1  0 0 0 0 0 0 1      Review of Systems  Constitutional: Positive for  activity change and fatigue. Negative for fever, chills and appetite change.  HENT: Positive for congestion, sore throat, rhinorrhea, voice change and postnasal drip. Negative for facial swelling, neck pain and neck stiffness.   Eyes: Negative.   Respiratory: Positive for cough, shortness of breath and wheezing.   Cardiovascular: Negative.   Gastrointestinal: Negative.   Musculoskeletal:       Chest wall and rib Pain associated with cough and movement.  Skin: Negative for pallor and rash.  Neurological: Negative.     Allergies  Other; Codeine; and Latex  Home Medications   Current Outpatient Rx  Name Route Sig Dispense Refill  . ALBUTEROL SULFATE HFA 108 (90 BASE) MCG/ACT IN AERS Inhalation Inhale 2 puffs into the lungs every 6 (six) hours as needed. For shortess of breath     .  ATORVASTATIN CALCIUM 40 MG PO TABS Oral Take 40 mg by mouth at bedtime.     Marland Kitchen CLONAZEPAM 1 MG PO TABS Oral Take 1 mg by mouth 2 (two) times daily as needed. For anxiety     . ESCITALOPRAM OXALATE 10 MG PO TABS Oral Take 15 mg by mouth daily.     Marland Kitchen ESOMEPRAZOLE MAGNESIUM 40 MG PO CPDR Oral Take 40 mg by mouth daily before breakfast.    . OMEGA-3 FATTY ACIDS 1000 MG PO CAPS Oral Take 1 g by mouth daily.    Marland Kitchen FLUTICASONE PROPIONATE 50 MCG/ACT NA SUSP Nasal Place 2 sprays into the nose at bedtime.     Marland Kitchen FLUTICASONE-SALMETEROL 250-50 MCG/DOSE IN AEPB Inhalation Inhale 1 puff into the lungs every 12 (twelve) hours.      . FUROSEMIDE 40 MG PO TABS Oral Take 20 mg by mouth daily.     Marland Kitchen GABAPENTIN 600 MG PO TABS Oral Take 600 mg by mouth 4 (four) times daily.      Marland Kitchen LABETALOL HCL 100 MG PO TABS Oral Take 100 mg by mouth 2 (two) times daily.      Marland Kitchen LEVOTHYROXINE SODIUM 100 MCG PO TABS Oral Take 100 mcg by mouth daily.      Marland Kitchen LORATADINE 10 MG PO TABS Oral Take 10 mg by mouth daily.    Marland Kitchen LOSARTAN POTASSIUM 50 MG PO TABS Oral Take 50 mg by mouth daily.    . METHYLPREDNISOLONE 4 MG PO KIT  follow package directions 21 tablet 0  . MONTELUKAST SODIUM 10 MG PO TABS Oral Take 10 mg by mouth at bedtime.      . OXYCODONE-ACETAMINOPHEN 5-325 MG PO TABS Oral Take 2 tablets by mouth every 4 (four) hours as needed for pain. 15 tablet 0  . PRASUGREL HCL 10 MG PO TABS Oral Take 10 mg by mouth daily.    Marland Kitchen PROMETHAZINE HCL 25 MG PO TABS Oral Take 1 tablet (25 mg total) by mouth every 6 (six) hours as needed for nausea. 12 tablet 0  . VITAMIN B-12 500 MCG PO TABS Oral Take 500 mcg by mouth daily.    Marland Kitchen ZOLPIDEM TARTRATE 5 MG PO TABS Oral Take 5 mg by mouth at bedtime as needed. For sleep      BP 152/60  Pulse 71  Temp 99.5 F (37.5 C) (Oral)  Resp 16  SpO2 97%  Physical Exam  Constitutional: She is oriented to person, place, and time. She appears well-developed and well-nourished. No distress.  HENT:  Head:  Atraumatic.  Right Ear: External ear normal.  Left Ear: External ear normal.  Mouth/Throat: Oropharynx is clear and  moist. No oropharyngeal exudate.       Oropharynx was clear PND. Bilateral TMs pearly gray, transparent, normal light reflex, no erythema, effusion or bulging.  Eyes: EOM are normal.  Neck: Normal range of motion. Neck supple.  Cardiovascular: Normal rate and regular rhythm.   Pulmonary/Chest: No respiratory distress. She has wheezes. She has no rales. She exhibits tenderness.       Mild increase in respiratory effort. Tight wheezes bilaterally.  Musculoskeletal: Normal range of motion. She exhibits no edema.  Lymphadenopathy:    She has no cervical adenopathy.  Neurological: She is alert and oriented to person, place, and time.  Skin: Skin is warm and dry. No rash noted.  Psychiatric: She has a normal mood and affect.    ED Course  Procedures (including critical care time)  Labs Reviewed - No data to display Dg Chest 2 View  05/08/2012  *RADIOLOGY REPORT*  Clinical Data: Cough.  Upper respiratory tract infection  CHEST - 2 VIEW  Comparison: 04/12/2012  Findings: The heart size is normal.  No pleural effusion or edema. Lung volumes are low.  There are coarsened interstitial markings identified consistent with COPD.  Right middle lobe bullous disease appears similar to previous exam. The visualized osseous structures are significant for mild upper thoracic spine degenerative disc disease.  IMPRESSION:  1.  No acute cardiopulmonary abnormalities. 2.  Emphysema.   Original Report Authenticated By: Rosealee Albee, M.D.      1. COPD with emphysema   2. Bronchospasm   3. URI (upper respiratory infection)   4. Tobacco abuse disorder       MDM  Duo neb.   Post neb the patient states she is feeling much better. His breathing easier and senses fewer wheezes. Objectively she has improved air movement with decrease in wheezing. She will continue to use her albuterol  nebulizer and HFA as directed at home. Medrol Dosepak Continue Singulair and Advair and other inhalers and medications associated with asthma and emphysema. Return if worsening symptoms, problems or fever.        Hayden Rasmussen, NP 05/08/12 [phone]

## 2012-05-08 NOTE — ED Provider Notes (Signed)
Medical screening examination/treatment/procedure(s) were performed by non-physician practitioner and as supervising physician I was immediately available for consultation/collaboration.  Leslee Home, M.D.   Reuben Likes, MD 05/08/12 [phone]

## 2012-09-02 ENCOUNTER — Telehealth: Payer: Self-pay | Admitting: Radiology

## 2012-09-02 NOTE — Telephone Encounter (Signed)
Spoke to [facility]. They are requesting medications. Patient has not been seen here. Pharmacy will advise patient she needs to be seen.

## 2012-09-22 ENCOUNTER — Ambulatory Visit (HOSPITAL_COMMUNITY)
Admission: RE | Admit: 2012-09-22 | Discharge: 2012-09-22 | Disposition: A | Discharge status: Home / Self Care | Payer: Self-pay | Source: Ambulatory Visit | Attending: Internal Medicine | Admitting: Internal Medicine

## 2012-09-22 ENCOUNTER — Other Ambulatory Visit (HOSPITAL_COMMUNITY): Payer: Self-pay | Admitting: Internal Medicine

## 2012-09-22 DIAGNOSIS — R0602 Shortness of breath: Secondary | ICD-10-CM | POA: Insufficient documentation

## 2012-09-22 DIAGNOSIS — J438 Other emphysema: Secondary | ICD-10-CM | POA: Insufficient documentation

## 2012-09-30 ENCOUNTER — Encounter (HOSPITAL_COMMUNITY): Payer: Self-pay | Admitting: *Deleted

## 2012-10-13 ENCOUNTER — Encounter (HOSPITAL_COMMUNITY): Payer: Self-pay

## 2012-10-13 ENCOUNTER — Ambulatory Visit (HOSPITAL_COMMUNITY)
Admission: RE | Admit: 2012-10-13 | Discharge: 2012-10-13 | Disposition: A | Discharge status: Home / Self Care | Payer: Self-pay | Source: Ambulatory Visit | Attending: Obstetrics and Gynecology | Admitting: Obstetrics and Gynecology

## 2012-10-13 VITALS — BP 142/74 | Temp 98.9°F | Ht 61.5 in | Wt 174.8 lb

## 2012-10-13 DIAGNOSIS — N6325 Unspecified lump in the left breast, overlapping quadrants: Secondary | ICD-10-CM | POA: Insufficient documentation

## 2012-10-13 DIAGNOSIS — Z1239 Encounter for other screening for malignant neoplasm of breast: Secondary | ICD-10-CM

## 2012-10-13 NOTE — Progress Notes (Signed)
Complaints of left breast lump and left outer breast soreness x 1 month. Patient stated the soreness was all the time and she rated the pain at a 2 out of 10.  Pap Smear:    Pap smear not completed today. Last Pap smear was 06/11/2011 at [facility] and normal. Per patient has no history of abnormal Pap smears. Patient has a history of a hysterectomy for benign reasons. Patient no longer needs Pap smears per BCCCP and ACOG guidelines. Last Pap smear result is in EPIC.  Physical exam: Breasts Breasts symmetrical. Bilateral breast dryness. No nipple retraction bilateral breasts. No nipple discharge bilateral breasts. No lymphadenopathy. No lumps palpated right breast. Palpated lump within the left breast at 3 o'clock 2 cm from the nipple. Patient complained of left outer breast tenderness that increased when palpated lump. Referred patient to [facility] for diagnostic mammogram and possible left breast ultrasound. Appointment scheduled for Wednesday, October 14, 2012 at 1315.   Pelvic/Bimanual No Pap smear completed today since last Pap smear was 06/11/2011 and has a history of a hysterectomy for benign reasons. Pap smear not indicated per BCCCP guidelines.

## 2012-10-13 NOTE — Patient Instructions (Addendum)
Taught patient how to perform BSE-. Patient did not need a Pap smear today due to last Pap smear was 06/11/2011. Patient has a history of a hysterectomy for benign reasons. Let her know that she will not need any further Pap smears. Referred patient to [facility] for diagnostic mammogram and possible left breast ultrasound. Appointment scheduled for Wednesday, October 14, 2012 at 1315. Patient aware of appointment and will be there. Patient complained of left arm lump around elbow that the pain radiated to the breast. Encouraged patient to follow up with her PCP on the left arm lump. Patient verbalized understanding.

## 2013-01-18 ENCOUNTER — Other Ambulatory Visit (HOSPITAL_COMMUNITY): Payer: Self-pay | Admitting: Cardiovascular Disease

## 2013-01-19 NOTE — Telephone Encounter (Signed)
Rx was sent to pharmacy electronically. 

## 2013-01-29 ENCOUNTER — Telehealth: Payer: Self-pay | Admitting: Pharmacist Clinician (PhC)/ Clinical Pharmacy Specialist

## 2013-01-29 NOTE — Telephone Encounter (Signed)
[employer] assistance form for refill of Effient faxed today, LMOM for pt, will call when arrives.

## 2013-02-22 ENCOUNTER — Encounter (HOSPITAL_COMMUNITY): Payer: Self-pay | Admitting: Emergency Medicine

## 2013-02-22 ENCOUNTER — Emergency Department (HOSPITAL_COMMUNITY)
Admission: EM | Admit: 2013-02-22 | Discharge: 2013-02-22 | Disposition: A | Discharge status: Home / Self Care | Payer: Self-pay | Attending: Emergency Medicine | Admitting: Emergency Medicine

## 2013-02-22 ENCOUNTER — Emergency Department (HOSPITAL_COMMUNITY): Payer: Self-pay

## 2013-02-22 DIAGNOSIS — M79642 Pain in left hand: Secondary | ICD-10-CM

## 2013-02-22 DIAGNOSIS — M129 Arthropathy, unspecified: Secondary | ICD-10-CM | POA: Insufficient documentation

## 2013-02-22 DIAGNOSIS — Y929 Unspecified place or not applicable: Secondary | ICD-10-CM | POA: Insufficient documentation

## 2013-02-22 DIAGNOSIS — L408 Other psoriasis: Secondary | ICD-10-CM | POA: Insufficient documentation

## 2013-02-22 DIAGNOSIS — G4700 Insomnia, unspecified: Secondary | ICD-10-CM | POA: Insufficient documentation

## 2013-02-22 DIAGNOSIS — Z7982 Long term (current) use of aspirin: Secondary | ICD-10-CM | POA: Insufficient documentation

## 2013-02-22 DIAGNOSIS — J449 Chronic obstructive pulmonary disease, unspecified: Secondary | ICD-10-CM | POA: Insufficient documentation

## 2013-02-22 DIAGNOSIS — F411 Generalized anxiety disorder: Secondary | ICD-10-CM | POA: Insufficient documentation

## 2013-02-22 DIAGNOSIS — M549 Dorsalgia, unspecified: Secondary | ICD-10-CM | POA: Insufficient documentation

## 2013-02-22 DIAGNOSIS — J4489 Other specified chronic obstructive pulmonary disease: Secondary | ICD-10-CM | POA: Insufficient documentation

## 2013-02-22 DIAGNOSIS — F3289 Other specified depressive episodes: Secondary | ICD-10-CM | POA: Insufficient documentation

## 2013-02-22 DIAGNOSIS — F17200 Nicotine dependence, unspecified, uncomplicated: Secondary | ICD-10-CM | POA: Insufficient documentation

## 2013-02-22 DIAGNOSIS — Z91040 Latex allergy status: Secondary | ICD-10-CM | POA: Insufficient documentation

## 2013-02-22 DIAGNOSIS — E785 Hyperlipidemia, unspecified: Secondary | ICD-10-CM | POA: Insufficient documentation

## 2013-02-22 DIAGNOSIS — K219 Gastro-esophageal reflux disease without esophagitis: Secondary | ICD-10-CM | POA: Insufficient documentation

## 2013-02-22 DIAGNOSIS — F329 Major depressive disorder, single episode, unspecified: Secondary | ICD-10-CM | POA: Insufficient documentation

## 2013-02-22 DIAGNOSIS — Z8742 Personal history of other diseases of the female genital tract: Secondary | ICD-10-CM | POA: Insufficient documentation

## 2013-02-22 DIAGNOSIS — W19XXXA Unspecified fall, initial encounter: Secondary | ICD-10-CM | POA: Insufficient documentation

## 2013-02-22 DIAGNOSIS — I2510 Atherosclerotic heart disease of native coronary artery without angina pectoris: Secondary | ICD-10-CM | POA: Insufficient documentation

## 2013-02-22 DIAGNOSIS — Z79899 Other long term (current) drug therapy: Secondary | ICD-10-CM | POA: Insufficient documentation

## 2013-02-22 DIAGNOSIS — S60229A Contusion of unspecified hand, initial encounter: Secondary | ICD-10-CM | POA: Insufficient documentation

## 2013-02-22 DIAGNOSIS — Y9301 Activity, walking, marching and hiking: Secondary | ICD-10-CM | POA: Insufficient documentation

## 2013-02-22 DIAGNOSIS — Z885 Allergy status to narcotic agent status: Secondary | ICD-10-CM | POA: Insufficient documentation

## 2013-02-22 DIAGNOSIS — R238 Other skin changes: Secondary | ICD-10-CM | POA: Insufficient documentation

## 2013-02-22 DIAGNOSIS — G8929 Other chronic pain: Secondary | ICD-10-CM | POA: Insufficient documentation

## 2013-02-22 DIAGNOSIS — I10 Essential (primary) hypertension: Secondary | ICD-10-CM | POA: Insufficient documentation

## 2013-02-22 DIAGNOSIS — M25549 Pain in joints of unspecified hand: Secondary | ICD-10-CM | POA: Insufficient documentation

## 2013-02-22 DIAGNOSIS — J45909 Unspecified asthma, uncomplicated: Secondary | ICD-10-CM | POA: Insufficient documentation

## 2013-02-22 DIAGNOSIS — E039 Hypothyroidism, unspecified: Secondary | ICD-10-CM | POA: Insufficient documentation

## 2013-02-22 DIAGNOSIS — Z8719 Personal history of other diseases of the digestive system: Secondary | ICD-10-CM | POA: Insufficient documentation

## 2013-02-22 DIAGNOSIS — M25449 Effusion, unspecified hand: Secondary | ICD-10-CM | POA: Insufficient documentation

## 2013-02-22 MED ORDER — OXYCODONE-ACETAMINOPHEN 5-325 MG PO TABS: 1.0000 | ORAL_TABLET | ORAL | Status: DC | PRN

## 2013-02-22 NOTE — ED Provider Notes (Signed)
Medical screening examination/treatment/procedure(s) were performed by non-physician practitioner and as supervising physician I was immediately available for consultation/collaboration.  Geoffery Lyons, MD 02/22/13 [phone]

## 2013-02-22 NOTE — ED Notes (Signed)
Pt fell onto left hand 2 days ago. Has swelling and pain to hand.

## 2013-02-22 NOTE — ED Provider Notes (Signed)
CSN: [id number]     Arrival date & time 02/22/13  0849 History     First MD Initiated Contact with Patient 02/22/13 [phone]     Chief Complaint  Patient presents with  . Hand Pain   (Consider location/radiation/quality/duration/timing/severity/associated sxs/prior Treatment) The history is provided by the patient and medical records.   Patient presents to the ED complaining of left hand pain x2 days. Patient states over the weekend she was walking and fell onto her left hand. No head trauma or LOC.  Hand has been swelling since injury, and has a constant throbbing sensation, worse with moving her fingers or trying to make a fist.  No prior wrist injury or surgery.  Notes some intermittent paresthesias of left 4th and 5th digits.  Has taken OTC pain meds without significant relief.    Past Medical History  Diagnosis Date  . Thyroid disease   . Coronary artery disease   . Hernia   . Chronic back pain     herniated nucleus pulposus  . Hyperlipidemia     takes Lipitor daily  . Hypertension     takes Losartan daily and Labetalol bid  . Heart attack 2011  . Asthma   . COPD (chronic obstructive pulmonary disease)     early stages  . Shortness of breath     with exertion  . Bronchitis     couple of months ago  . Arthritis   . Joint pain   . Neck pain   . Bruises easily     pt is on Effient  . Psoriasis     elbows,knees,back  . H/O hiatal hernia   . GERD (gastroesophageal reflux disease)     takes Nexium daily  . Hemorrhoids   . Diverticulosis   . Slowing of urinary stream   . Hypothyroidism     takes Synthroid daily  . Depression     takes Klonopin daily  . Anxiety     takes Lexapro daily  . Insomnia     hydroxyzine prn  . Bartholin gland cyst 08/29/2011   Past Surgical History  Procedure Laterality Date  . Abdominal hysterectomy  1977  . Laparoscopy  1977    exploratory  . Tonsillectomy      as a child  . Coronary angioplasty with stent placement  2011    x 2  .  Colonoscopy    . Esophagogastroduodenscoy    . Lumbar laminectomy/decompression microdiscectomy  08/05/2011    Procedure: LUMBAR LAMINECTOMY/DECOMPRESSION MICRODISCECTOMY;  Surgeon: Clydene Fake, MD;  Location: [facility];  Service: Neurosurgery;  Laterality: Right;  Right Lumbar four-five extraforaminal discectomy   Family History  Problem Relation Age of Onset  . Hypotension Neg Hx   . Malignant hyperthermia Neg Hx   . Pseudochol deficiency Neg Hx   . Anesthesia problems Daughter   . Heart disease Mother   . Hypertension Mother   . Stroke Mother   . Heart disease Father   . Hypertension Father   . Diabetes Father   . Cancer Maternal Aunt     breast  . Thyroid disease Paternal Aunt   . Heart disease Maternal Grandfather   . Cancer Paternal Grandmother     mouth   History  Substance Use Topics  . Smoking status: Current Every Day Smoker -- 1.00 packs/day for 44 years  . Smokeless tobacco: Not on file  . Alcohol Use: Yes     Comment: rarely   OB History  Grav Para Term Preterm Abortions TAB SAB Ect Mult Living   1 1 1  0 0 0 0 0 0 1     Review of Systems  Musculoskeletal: Positive for joint swelling and arthralgias.  All other systems reviewed and are negative.    Allergies  Other; Codeine; and Latex  Home Medications   Current Outpatient Rx  Name  Route  Sig  Dispense  Refill  . albuterol (PROVENTIL HFA;VENTOLIN HFA) 108 (90 BASE) MCG/ACT inhaler   Inhalation   Inhale 2 puffs into the lungs every 6 (six) hours as needed. For shortess of breath          . aspirin 325 MG tablet   Oral   Take 325 mg by mouth daily.         Marland Kitchen atorvastatin (LIPITOR) 40 MG tablet   Oral   Take 40 mg by mouth daily.         Marland Kitchen CALCIUM PO   Oral   Take 1 tablet by mouth daily.         . clonazePAM (KLONOPIN) 1 MG tablet   Oral   Take 1 mg by mouth 2 (two) times daily as needed. For anxiety          . escitalopram (LEXAPRO) 10 MG tablet   Oral   Take 10 mg by  mouth daily.          . fish oil-omega-3 fatty acids 1000 MG capsule   Oral   Take 1 g by mouth daily.         . fluticasone (FLONASE) 50 MCG/ACT nasal spray   Nasal   Place 2 sprays into the nose at bedtime.          . gabapentin (NEURONTIN) 600 MG tablet   Oral   Take 600 mg by mouth 4 (four) times daily.           Marland Kitchen labetalol (NORMODYNE) 100 MG tablet   Oral   Take 100 mg by mouth 2 (two) times daily.           . lansoprazole (PREVACID) 15 MG capsule   Oral   Take 15 mg by mouth daily.         Marland Kitchen levothyroxine (SYNTHROID, LEVOTHROID) 100 MCG tablet   Oral   Take 100 mcg by mouth daily.           Marland Kitchen losartan (COZAAR) 50 MG tablet   Oral   Take 50 mg by mouth daily.         . prasugrel (EFFIENT) 10 MG TABS   Oral   Take 10 mg by mouth daily.         . traMADol (ULTRAM) 50 MG tablet   Oral   Take 50 mg by mouth every 6 (six) hours as needed for pain.         Marland Kitchen zolpidem (AMBIEN) 5 MG tablet   Oral   Take 5 mg by mouth at bedtime as needed. For sleep          BP 137/70  Pulse 72  Temp(Src) 98.6 F (37 C) (Oral)  Resp 20  SpO2 98%  Physical Exam  Nursing note and vitals reviewed. Constitutional: She is oriented to person, place, and time. She appears well-developed and well-nourished.  HENT:  Head: Normocephalic and atraumatic.  Eyes: Conjunctivae and EOM are normal. Pupils are equal, round, and reactive to light.  Neck: Normal range of motion. Neck supple.  Cardiovascular: Normal  rate, regular rhythm and normal heart sounds.   Pulmonary/Chest: Effort normal and breath sounds normal.  Musculoskeletal: She exhibits no edema.       Right hand: She exhibits decreased range of motion, tenderness, bony tenderness and swelling. She exhibits normal capillary refill, no deformity and no laceration. Normal sensation noted.       Hands: Swelling and bruising over left 4th and 5th metacarpals; full ROM of left wrist, limited flexion/extension of  left fingers due to pain; strong radial pulse and cap refill, sensation intact  Neurological: She is alert and oriented to person, place, and time.  Skin: Skin is warm and dry.  Psychiatric: She has a normal mood and affect.    ED Course   Procedures (including critical care time)  Labs Reviewed - No data to display Dg Hand Complete Left  02/22/2013   *RADIOLOGY REPORT*  Clinical Data: Swelling, pain  LEFT HAND - COMPLETE 3+ VIEW  Comparison: None.  Findings: Healed deformity of the left third finger proximal phalanx from prior trauma.  Normal alignment.  No acute displaced fracture.  Preserved joint spaces.  No definite soft tissue abnormality.  IMPRESSION: No acute osseous finding.   Original Report Authenticated By: Judie Petit. Shick, M.D.   1. Hand pain, left     MDM   X-ray negative for acute fx or dislocation.  Hand splint applied for comfort.  Pt may FU with hand surgeon, Dr. Melvyn Novas, or may FU with [facility]-- contact information given for both.  Rx percocet.  Discussed plan with pt, she agreed.  Return precautions advised.  Garlon Hatchet, PA-C 02/22/13 1015

## 2013-02-27 ENCOUNTER — Emergency Department (HOSPITAL_COMMUNITY)
Admission: EM | Admit: 2013-02-27 | Discharge: 2013-02-27 | Disposition: A | Discharge status: Home / Self Care | Payer: Self-pay | Attending: Emergency Medicine | Admitting: Emergency Medicine

## 2013-02-27 ENCOUNTER — Encounter (HOSPITAL_COMMUNITY): Payer: Self-pay | Admitting: *Deleted

## 2013-02-27 DIAGNOSIS — I2510 Atherosclerotic heart disease of native coronary artery without angina pectoris: Secondary | ICD-10-CM | POA: Insufficient documentation

## 2013-02-27 DIAGNOSIS — R0989 Other specified symptoms and signs involving the circulatory and respiratory systems: Secondary | ICD-10-CM | POA: Insufficient documentation

## 2013-02-27 DIAGNOSIS — Z91040 Latex allergy status: Secondary | ICD-10-CM | POA: Insufficient documentation

## 2013-02-27 DIAGNOSIS — K219 Gastro-esophageal reflux disease without esophagitis: Secondary | ICD-10-CM | POA: Insufficient documentation

## 2013-02-27 DIAGNOSIS — Z872 Personal history of diseases of the skin and subcutaneous tissue: Secondary | ICD-10-CM | POA: Insufficient documentation

## 2013-02-27 DIAGNOSIS — I10 Essential (primary) hypertension: Secondary | ICD-10-CM | POA: Insufficient documentation

## 2013-02-27 DIAGNOSIS — M129 Arthropathy, unspecified: Secondary | ICD-10-CM | POA: Insufficient documentation

## 2013-02-27 DIAGNOSIS — Z8709 Personal history of other diseases of the respiratory system: Secondary | ICD-10-CM | POA: Insufficient documentation

## 2013-02-27 DIAGNOSIS — L539 Erythematous condition, unspecified: Secondary | ICD-10-CM | POA: Insufficient documentation

## 2013-02-27 DIAGNOSIS — F329 Major depressive disorder, single episode, unspecified: Secondary | ICD-10-CM | POA: Insufficient documentation

## 2013-02-27 DIAGNOSIS — G8929 Other chronic pain: Secondary | ICD-10-CM | POA: Insufficient documentation

## 2013-02-27 DIAGNOSIS — J449 Chronic obstructive pulmonary disease, unspecified: Secondary | ICD-10-CM | POA: Insufficient documentation

## 2013-02-27 DIAGNOSIS — J4489 Other specified chronic obstructive pulmonary disease: Secondary | ICD-10-CM | POA: Insufficient documentation

## 2013-02-27 DIAGNOSIS — Z79899 Other long term (current) drug therapy: Secondary | ICD-10-CM | POA: Insufficient documentation

## 2013-02-27 DIAGNOSIS — E079 Disorder of thyroid, unspecified: Secondary | ICD-10-CM | POA: Insufficient documentation

## 2013-02-27 DIAGNOSIS — Z8719 Personal history of other diseases of the digestive system: Secondary | ICD-10-CM | POA: Insufficient documentation

## 2013-02-27 DIAGNOSIS — T7840XA Allergy, unspecified, initial encounter: Secondary | ICD-10-CM

## 2013-02-27 DIAGNOSIS — L299 Pruritus, unspecified: Secondary | ICD-10-CM | POA: Insufficient documentation

## 2013-02-27 DIAGNOSIS — Z8742 Personal history of other diseases of the female genital tract: Secondary | ICD-10-CM | POA: Insufficient documentation

## 2013-02-27 DIAGNOSIS — R21 Rash and other nonspecific skin eruption: Secondary | ICD-10-CM | POA: Insufficient documentation

## 2013-02-27 DIAGNOSIS — F411 Generalized anxiety disorder: Secondary | ICD-10-CM | POA: Insufficient documentation

## 2013-02-27 DIAGNOSIS — R0609 Other forms of dyspnea: Secondary | ICD-10-CM | POA: Insufficient documentation

## 2013-02-27 DIAGNOSIS — F17200 Nicotine dependence, unspecified, uncomplicated: Secondary | ICD-10-CM | POA: Insufficient documentation

## 2013-02-27 DIAGNOSIS — E039 Hypothyroidism, unspecified: Secondary | ICD-10-CM | POA: Insufficient documentation

## 2013-02-27 DIAGNOSIS — Z7982 Long term (current) use of aspirin: Secondary | ICD-10-CM | POA: Insufficient documentation

## 2013-02-27 DIAGNOSIS — F3289 Other specified depressive episodes: Secondary | ICD-10-CM | POA: Insufficient documentation

## 2013-02-27 DIAGNOSIS — E785 Hyperlipidemia, unspecified: Secondary | ICD-10-CM | POA: Insufficient documentation

## 2013-02-27 DIAGNOSIS — Z8679 Personal history of other diseases of the circulatory system: Secondary | ICD-10-CM | POA: Insufficient documentation

## 2013-02-27 MED ORDER — DEXAMETHASONE SODIUM PHOSPHATE 10 MG/ML IJ SOLN
10.0000 mg | Freq: Once | INTRAMUSCULAR | Status: AC
  Administered 2013-02-27: 10 mg via INTRAVENOUS
  Filled 2013-02-27: qty 1

## 2013-02-27 MED ORDER — DIPHENHYDRAMINE HCL 50 MG/ML IJ SOLN
50.0000 mg | Freq: Once | INTRAMUSCULAR | Status: AC
  Administered 2013-02-27: 50 mg via INTRAVENOUS
  Filled 2013-02-27: qty 1

## 2013-02-27 MED ORDER — SODIUM CHLORIDE 0.9 % IV BOLUS (SEPSIS)
1000.0000 mL | Freq: Once | INTRAVENOUS | Status: AC
  Administered 2013-02-27: 1000 mL via INTRAVENOUS

## 2013-02-27 MED ORDER — FAMOTIDINE IN NACL 20-0.9 MG/50ML-% IV SOLN
20.0000 mg | Freq: Once | INTRAVENOUS | Status: AC
  Administered 2013-02-27: 20 mg via INTRAVENOUS
  Filled 2013-02-27: qty 50

## 2013-02-27 NOTE — ED Notes (Signed)
Pt reports onset of rash and itching today, denies any new meds. No relief with two benadryl pta. Airway is intact. resp e/u.

## 2013-02-27 NOTE — ED Notes (Signed)
PT arrived to ED with reported allergic reaction to latex. Pt has a known HX of Latex allergy. Pt is now reporting sever itching over entire body . No resp. Distress.

## 2013-02-27 NOTE — ED Notes (Signed)
Pt states understanding of discharge instructions 

## 2013-02-27 NOTE — ED Provider Notes (Signed)
CSN: [id number]     Arrival date & time 02/27/13  1628 History     First MD Initiated Contact with Patient 02/27/13 1638     Chief Complaint  Patient presents with  . Allergic Reaction   (Consider location/radiation/quality/duration/timing/severity/associated sxs/prior Treatment) Patient is a 57 y.o. female presenting with allergic reaction. The history is provided by the patient.  Allergic Reaction Presenting symptoms: difficulty breathing, itching and rash   Presenting symptoms: no difficulty swallowing and no wheezing   Severity:  Moderate Context: chemicals   Relieved by:  None tried Worsened by:  Nothing tried Ineffective treatments:  None tried   Past Medical History  Diagnosis Date  . Thyroid disease   . Coronary artery disease   . Hernia   . Chronic back pain     herniated nucleus pulposus  . Hyperlipidemia     takes Lipitor daily  . Hypertension     takes Losartan daily and Labetalol bid  . Heart attack 2011  . Asthma   . COPD (chronic obstructive pulmonary disease)     early stages  . Shortness of breath     with exertion  . Bronchitis     couple of months ago  . Arthritis   . Joint pain   . Neck pain   . Bruises easily     pt is on Effient  . Psoriasis     elbows,knees,back  . H/O hiatal hernia   . GERD (gastroesophageal reflux disease)     takes Nexium daily  . Hemorrhoids   . Diverticulosis   . Slowing of urinary stream   . Hypothyroidism     takes Synthroid daily  . Depression     takes Klonopin daily  . Anxiety     takes Lexapro daily  . Insomnia     hydroxyzine prn  . Bartholin gland cyst 08/29/2011   Past Surgical History  Procedure Laterality Date  . Abdominal hysterectomy  1977  . Laparoscopy  1977    exploratory  . Tonsillectomy      as a child  . Coronary angioplasty with stent placement  2011    x 2  . Colonoscopy    . Esophagogastroduodenscoy    . Lumbar laminectomy/decompression microdiscectomy  08/05/2011    Procedure:  LUMBAR LAMINECTOMY/DECOMPRESSION MICRODISCECTOMY;  Surgeon: Clydene Fake, MD;  Location: [facility];  Service: Neurosurgery;  Laterality: Right;  Right Lumbar four-five extraforaminal discectomy   Family History  Problem Relation Age of Onset  . Hypotension Neg Hx   . Malignant hyperthermia Neg Hx   . Pseudochol deficiency Neg Hx   . Anesthesia problems Daughter   . Heart disease Mother   . Hypertension Mother   . Stroke Mother   . Heart disease Father   . Hypertension Father   . Diabetes Father   . Cancer Maternal Aunt     breast  . Thyroid disease Paternal Aunt   . Heart disease Maternal Grandfather   . Cancer Paternal Grandmother     mouth   History  Substance Use Topics  . Smoking status: Current Every Day Smoker -- 1.00 packs/day for 44 years  . Smokeless tobacco: Not on file  . Alcohol Use: Yes     Comment: rarely   OB History   Grav Para Term Preterm Abortions TAB SAB Ect Mult Living   1 1 1  0 0 0 0 0 0 1     Review of Systems  Constitutional: Negative for fever,  chills, diaphoresis and fatigue.  HENT: Negative for ear pain, congestion, sore throat, facial swelling, mouth sores, trouble swallowing, neck pain and neck stiffness.   Eyes: Negative.   Respiratory: Negative for apnea, cough, chest tightness, shortness of breath and wheezing.   Cardiovascular: Negative for chest pain, palpitations and leg swelling.  Gastrointestinal: Negative for nausea, vomiting, abdominal pain, diarrhea and abdominal distention.  Genitourinary: Negative for hematuria, flank pain, vaginal discharge, difficulty urinating and menstrual problem.  Musculoskeletal: Negative for back pain and gait problem.  Skin: Positive for itching and rash. Negative for wound.  Neurological: Negative for dizziness, tremors, seizures, syncope, facial asymmetry, numbness and headaches.  Psychiatric/Behavioral: Negative.   All other systems reviewed and are negative.    Allergies  Other; Codeine; and  Latex  Home Medications   Current Outpatient Rx  Name  Route  Sig  Dispense  Refill  . albuterol (PROVENTIL HFA;VENTOLIN HFA) 108 (90 BASE) MCG/ACT inhaler   Inhalation   Inhale 2 puffs into the lungs every 6 (six) hours as needed. For shortess of breath          . aspirin 325 MG tablet   Oral   Take 325 mg by mouth daily.         Marland Kitchen atorvastatin (LIPITOR) 40 MG tablet   Oral   Take 40 mg by mouth daily.         Marland Kitchen CALCIUM PO   Oral   Take 1 tablet by mouth daily.         . clonazePAM (KLONOPIN) 1 MG tablet   Oral   Take 1 mg by mouth 2 (two) times daily as needed. For anxiety          . escitalopram (LEXAPRO) 10 MG tablet   Oral   Take 10 mg by mouth daily.          . fish oil-omega-3 fatty acids 1000 MG capsule   Oral   Take 1 g by mouth daily.         . fluticasone (FLONASE) 50 MCG/ACT nasal spray   Nasal   Place 2 sprays into the nose at bedtime.          . gabapentin (NEURONTIN) 600 MG tablet   Oral   Take 600 mg by mouth 4 (four) times daily.           Marland Kitchen labetalol (NORMODYNE) 100 MG tablet   Oral   Take 100 mg by mouth 2 (two) times daily.           . lansoprazole (PREVACID) 15 MG capsule   Oral   Take 15 mg by mouth daily.         Marland Kitchen levothyroxine (SYNTHROID, LEVOTHROID) 100 MCG tablet   Oral   Take 100 mcg by mouth daily.           Marland Kitchen losartan (COZAAR) 50 MG tablet   Oral   Take 50 mg by mouth daily.         . prasugrel (EFFIENT) 10 MG TABS   Oral   Take 10 mg by mouth daily.         . traMADol (ULTRAM) 50 MG tablet   Oral   Take 50 mg by mouth every 6 (six) hours as needed for pain.         Marland Kitchen zolpidem (AMBIEN) 5 MG tablet   Oral   Take 5 mg by mouth at bedtime as needed. For sleep         .  oxyCODONE-acetaminophen (PERCOCET/ROXICET) 5-325 MG per tablet   Oral   Take 1 tablet by mouth every 4 (four) hours as needed for pain.   10 tablet   0    BP 108/55  Pulse 73  Temp(Src) 98.8 F (37.1 C) (Oral)   Resp 16  SpO2 94% Physical Exam  Nursing note and vitals reviewed. Constitutional: She is oriented to person, place, and time. She appears well-developed and well-nourished. No distress.  HENT:  Head: Normocephalic and atraumatic.  Right Ear: External ear normal.  Left Ear: External ear normal.  Nose: Nose normal.  Mouth/Throat: Oropharynx is clear and moist. No oropharyngeal exudate.  Eyes: Conjunctivae and EOM are normal. Pupils are equal, round, and reactive to light. Right eye exhibits no discharge. Left eye exhibits no discharge.  Neck: Normal range of motion. Neck supple. No JVD present. No tracheal deviation present. No thyromegaly present.  Cardiovascular: Normal rate, regular rhythm, normal heart sounds and intact distal pulses.  Exam reveals no gallop and no friction rub.   No murmur heard. Pulmonary/Chest: Effort normal and breath sounds normal. No respiratory distress. She has no wheezes. She has no rales. She exhibits no tenderness.  Abdominal: Soft. Bowel sounds are normal. She exhibits no distension. There is no tenderness. There is no rebound and no guarding.  Musculoskeletal: Normal range of motion.  Lymphadenopathy:    She has no cervical adenopathy.  Neurological: She is alert and oriented to person, place, and time. No cranial nerve deficit. Coordination normal.  Skin: Skin is warm. She is not diaphoretic. There is erythema.  Diffuse erythema and itching and warmth on upper arms and on legs  Psychiatric: She has a normal mood and affect. Her behavior is normal. Judgment and thought content normal.    ED Course   Procedures (including critical care time)  Labs Reviewed - No data to display No results found. 1. Allergic reaction, initial encounter     MDM  57 yr old F pt who says she has had allergic reactions to latex in the past here after going in to old house where there was possibly insulation that she was exposed to. She says that she then immediately  became itchy and had a red rash. She then came here. She says she was feeling somewhat short of breath but had no GI symptoms and did not seem to have any airway issues. Patient was given allergy meds via IV and then improved after observation.  Patient completely improved. Will give strict follow up precautions if symptoms recur.  Case discussed with Dr. Rob Bunting, MD 02/27/13 [phone]

## 2013-02-28 NOTE — ED Provider Notes (Signed)
I saw and evaluated the patient, reviewed the resident's note and I agree with the findings and plan.   Patient presents with itching and possible allergic reaction.  Patient was exposed to insulation and has had diffuse itching since.  She denies any SOB, wheezing or airway compromise.  She's non-toxic.  Patient given benadryl, pepcid and steroids with improvement of symptoms.  No evidence of anaphylaxis or indication for epi.  After history, exam, and medical workup I feel the patient has been appropriately medically screened and is safe for discharge home. Pertinent diagnoses were discussed with the patient. Patient was given return precautions.   Shon Baton, MD 02/28/13 [phone]

## 2013-03-05 ENCOUNTER — Other Ambulatory Visit: Payer: Self-pay | Admitting: *Deleted

## 2013-03-05 MED ORDER — LABETALOL HCL 100 MG PO TABS: 100.0000 mg | ORAL_TABLET | Freq: Two times a day (BID) | ORAL | Status: DC

## 2013-03-05 NOTE — Telephone Encounter (Signed)
Rx was sent to pharmacy electronically. 

## 2013-04-06 ENCOUNTER — Emergency Department (HOSPITAL_COMMUNITY)
Admission: EM | Admit: 2013-04-06 | Discharge: 2013-04-06 | Disposition: A | Discharge status: Home / Self Care | Payer: No Typology Code available for payment source | Source: Home / Self Care

## 2013-04-06 ENCOUNTER — Encounter (HOSPITAL_COMMUNITY): Payer: Self-pay | Admitting: Emergency Medicine

## 2013-04-06 DIAGNOSIS — J329 Chronic sinusitis, unspecified: Secondary | ICD-10-CM

## 2013-04-06 DIAGNOSIS — L409 Psoriasis, unspecified: Secondary | ICD-10-CM

## 2013-04-06 DIAGNOSIS — L408 Other psoriasis: Secondary | ICD-10-CM

## 2013-04-06 DIAGNOSIS — J441 Chronic obstructive pulmonary disease with (acute) exacerbation: Secondary | ICD-10-CM

## 2013-04-06 MED ORDER — IPRATROPIUM BROMIDE 0.02 % IN SOLN
0.5000 mg | Freq: Once | RESPIRATORY_TRACT | Status: AC
  Administered 2013-04-06: 0.5 mg via RESPIRATORY_TRACT

## 2013-04-06 MED ORDER — ALBUTEROL SULFATE (5 MG/ML) 0.5% IN NEBU
5.0000 mg | INHALATION_SOLUTION | Freq: Once | RESPIRATORY_TRACT | Status: AC
  Administered 2013-04-06: 5 mg via RESPIRATORY_TRACT

## 2013-04-06 MED ORDER — GUAIFENESIN-CODEINE 100-10 MG/5ML PO SOLN: 5.0000 mL | Freq: Three times a day (TID) | ORAL | Status: DC | PRN

## 2013-04-06 MED ORDER — AZITHROMYCIN 250 MG PO TABS: 250.0000 mg | ORAL_TABLET | Freq: Every day | ORAL | Status: DC

## 2013-04-06 MED ORDER — TRIAMCINOLONE ACETONIDE 0.1 % EX CREA: TOPICAL_CREAM | Freq: Two times a day (BID) | CUTANEOUS | Status: DC

## 2013-04-06 MED ORDER — PREDNISONE 50 MG PO TABS: 50.0000 mg | ORAL_TABLET | Freq: Every day | ORAL | Status: DC

## 2013-04-06 MED ORDER — ALBUTEROL SULFATE (5 MG/ML) 0.5% IN NEBU
INHALATION_SOLUTION | RESPIRATORY_TRACT | Status: AC
  Filled 2013-04-06: qty 1

## 2013-04-06 NOTE — ED Provider Notes (Signed)
[name] [name] is a 57 y.o. female who presents to Urgent Care today for headache cough congestion wheezing facial pain and pressure present for 6 days. Patient has tried some over-the-counter pain medications and albuterol which have not helped much. She has a medical history significant for COPD. She denies any current trouble breathing nausea vomiting diarrhea fevers or chills. She feels well otherwise.  Additionally she notes psoriasis patches on her extensor elbows bilaterally and dorsal knuckles. She would like triamcinolone cream if possible. She is her first established appointment with [facility] soon.    Past Medical History  Diagnosis Date  . Thyroid disease   . Coronary artery disease   . Hernia   . Chronic back pain     herniated nucleus pulposus  . Hyperlipidemia     takes Lipitor daily  . Hypertension     takes Losartan daily and Labetalol bid  . Heart attack 2011  . Asthma   . COPD (chronic obstructive pulmonary disease)     early stages  . Shortness of breath     with exertion  . Bronchitis     couple of months ago  . Arthritis   . Joint pain   . Neck pain   . Bruises easily     pt is on Effient  . Psoriasis     elbows,knees,back  . H/O hiatal hernia   . GERD (gastroesophageal reflux disease)     takes Nexium daily  . Hemorrhoids   . Diverticulosis   . Slowing of urinary stream   . Hypothyroidism     takes Synthroid daily  . Depression     takes Klonopin daily  . Anxiety     takes Lexapro daily  . Insomnia     hydroxyzine prn  . Bartholin gland cyst 08/29/2011   History  Substance Use Topics  . Smoking status: Current Every Day Smoker -- 1.00 packs/day for 44 years  . Smokeless tobacco: Not on file  . Alcohol Use: Yes     Comment: rarely   ROS as above Medications reviewed. No current facility-administered medications for this encounter.   Current Outpatient Prescriptions  Medication Sig Dispense Refill  . albuterol  (PROVENTIL HFA;VENTOLIN HFA) 108 (90 BASE) MCG/ACT inhaler Inhale 2 puffs into the lungs every 6 (six) hours as needed. For shortess of breath       . aspirin 325 MG tablet Take 325 mg by mouth daily.      Marland Kitchen atorvastatin (LIPITOR) 40 MG tablet Take 40 mg by mouth daily.      Marland Kitchen azithromycin (ZITHROMAX) 250 MG tablet Take 1 tablet (250 mg total) by mouth daily. Take first 2 tablets together, then 1 every day until finished.  6 tablet  0  . CALCIUM PO Take 1 tablet by mouth daily.      . clonazePAM (KLONOPIN) 1 MG tablet Take 1 mg by mouth 2 (two) times daily as needed. For anxiety       . escitalopram (LEXAPRO) 10 MG tablet Take 10 mg by mouth daily.       . fish oil-omega-3 fatty acids 1000 MG capsule Take 1 g by mouth daily.      . fluticasone (FLONASE) 50 MCG/ACT nasal spray Place 2 sprays into the nose at bedtime.       . gabapentin (NEURONTIN) 600 MG tablet Take 600 mg by mouth 4 (four) times daily.        Marland Kitchen guaiFENesin-codeine 100-10 MG/5ML syrup  Take 5 mLs by mouth 3 (three) times daily as needed for cough.  120 mL  0  . labetalol (NORMODYNE) 100 MG tablet Take 1 tablet (100 mg total) by mouth 2 (two) times daily.  60 tablet  1  . lansoprazole (PREVACID) 15 MG capsule Take 15 mg by mouth daily.      Marland Kitchen levothyroxine (SYNTHROID, LEVOTHROID) 100 MCG tablet Take 100 mcg by mouth daily.        Marland Kitchen losartan (COZAAR) 50 MG tablet Take 50 mg by mouth daily.      Marland Kitchen oxyCODONE-acetaminophen (PERCOCET/ROXICET) 5-325 MG per tablet Take 1 tablet by mouth every 4 (four) hours as needed for pain.  10 tablet  0  . prasugrel (EFFIENT) 10 MG TABS Take 10 mg by mouth daily.      . predniSONE (DELTASONE) 50 MG tablet Take 1 tablet (50 mg total) by mouth daily.  7 tablet  0  . traMADol (ULTRAM) 50 MG tablet Take 50 mg by mouth every 6 (six) hours as needed for pain.      Marland Kitchen triamcinolone cream (KENALOG) 0.1 % Apply topically 2 (two) times daily.  30 g  1  . zolpidem (AMBIEN) 5 MG tablet Take 5 mg by mouth at bedtime  as needed. For sleep        Exam:  BP 155/90  Pulse 69  Temp(Src) 98.3 F (36.8 C) (Oral)  Resp 16  SpO2 98% Gen: Well NAD HEENT: EOMI,  MMM, tympanic membranes are normal appearing bilaterally. Posterior pharynx is mildly erythematous without exudate. Mildly tender to percussion bilateral maxillary sinus Lungs: Normal work of breathing wheezing present bilaterally. Prolonged expiratory phase Heart: RRR no MRG Abd: NABS, NT, ND Exts: Non edematous BL  LE, warm and well perfused.  Skin: Psoriasis. Patches on bilateral extensor elbows  Patient was given 5 mg nebulized albuterol as well as 0.5 mg nebulized Atrovent. She noted significant improvement of symptoms in her lung sounds improved as well  Assessment and Plan: 57 y.o. female with  1) sinusitis possibly mild versus bacterial complicating a COPD exacerbation.  Plan to treat with prednisone and azithromycin followup as needed 2) COPD exacerbation: Plan to use prednisone albuterol and azithromycin.  Continue home albuterol followup as needed 3) psoriasis: Plan to refill triamcinolone. Followup with primary care Dr. Discussed warning signs or symptoms. Please see discharge instructions. Patient expresses understanding.      Rodolph Bong, MD 04/06/13 [phone]

## 2013-04-06 NOTE — ED Notes (Signed)
Pt c/o poss sinus infection onset last Wednesday Sxs include:coughing, facial pressure, SOB, congestion, runny nose and blowing green mucous Denies: fevers Alert w/no signs of acuate distress.

## 2013-04-16 ENCOUNTER — Ambulatory Visit: Payer: Self-pay | Admitting: Family Medicine

## 2013-04-22 ENCOUNTER — Encounter (HOSPITAL_COMMUNITY): Payer: Self-pay | Admitting: Emergency Medicine

## 2013-04-22 ENCOUNTER — Emergency Department (HOSPITAL_COMMUNITY)
Admission: EM | Admit: 2013-04-22 | Discharge: 2013-04-22 | Disposition: A | Discharge status: Home / Self Care | Payer: No Typology Code available for payment source | Source: Home / Self Care | Attending: Family Medicine | Admitting: Family Medicine

## 2013-04-22 ENCOUNTER — Emergency Department (INDEPENDENT_AMBULATORY_CARE_PROVIDER_SITE_OTHER): Payer: No Typology Code available for payment source

## 2013-04-22 DIAGNOSIS — J441 Chronic obstructive pulmonary disease with (acute) exacerbation: Secondary | ICD-10-CM

## 2013-04-22 MED ORDER — GUAIFENESIN-CODEINE 100-10 MG/5ML PO SOLN: 5.0000 mL | Freq: Three times a day (TID) | ORAL | Status: DC | PRN

## 2013-04-22 MED ORDER — ALBUTEROL SULFATE (5 MG/ML) 0.5% IN NEBU
INHALATION_SOLUTION | RESPIRATORY_TRACT | Status: AC
  Filled 2013-04-22: qty 1

## 2013-04-22 MED ORDER — ALBUTEROL SULFATE (5 MG/ML) 0.5% IN NEBU
5.0000 mg | INHALATION_SOLUTION | Freq: Once | RESPIRATORY_TRACT | Status: AC
  Administered 2013-04-22: 5 mg via RESPIRATORY_TRACT

## 2013-04-22 MED ORDER — AMOXICILLIN 500 MG PO CAPS: 500.0000 mg | ORAL_CAPSULE | Freq: Three times a day (TID) | ORAL | Status: DC

## 2013-04-22 MED ORDER — IPRATROPIUM BROMIDE 0.02 % IN SOLN
0.5000 mg | Freq: Once | RESPIRATORY_TRACT | Status: AC
  Administered 2013-04-22: 0.5 mg via RESPIRATORY_TRACT

## 2013-04-22 MED ORDER — PREDNISONE 10 MG PO KIT: PACK | ORAL | Status: DC

## 2013-04-22 MED ORDER — IPRATROPIUM BROMIDE 0.02 % IN SOLN
RESPIRATORY_TRACT | Status: AC
  Filled 2013-04-22: qty 2.5

## 2013-04-22 NOTE — ED Notes (Signed)
Pt c/o SOB onset Tuesday Sxs include: fevers, facial pressure, wheezing, productive cough Smokes 1 PPD Denies: v/n/d Alert w/no signs of acute distress.

## 2013-04-22 NOTE — ED Provider Notes (Signed)
[name] [name] is a 57 y.o. female who presents to Urgent Care today for  cough congestion wheezing. Symptoms present for 3 days. Patient has tried some over-the-counter pain medications and albuterol which have not helped much. She has a medical history significant for COPD. She denies any current trouble breathing nausea vomiting diarrhea fevers or chills. She feels well otherwise.    Past Medical History  Diagnosis Date  . Thyroid disease   . Coronary artery disease   . Hernia   . Chronic back pain     herniated nucleus pulposus  . Hyperlipidemia     takes Lipitor daily  . Hypertension     takes Losartan daily and Labetalol bid  . Heart attack 2011  . Asthma   . COPD (chronic obstructive pulmonary disease)     early stages  . Shortness of breath     with exertion  . Bronchitis     couple of months ago  . Arthritis   . Joint pain   . Neck pain   . Bruises easily     pt is on Effient  . Psoriasis     elbows,knees,back  . H/O hiatal hernia   . GERD (gastroesophageal reflux disease)     takes Nexium daily  . Hemorrhoids   . Diverticulosis   . Slowing of urinary stream   . Hypothyroidism     takes Synthroid daily  . Depression     takes Klonopin daily  . Anxiety     takes Lexapro daily  . Insomnia     hydroxyzine prn  . Bartholin gland cyst 08/29/2011   History  Substance Use Topics  . Smoking status: Current Every Day Smoker -- 1.00 packs/day for 44 years  . Smokeless tobacco: Not on file  . Alcohol Use: Yes     Comment: rarely   ROS as above Medications reviewed. No current facility-administered medications for this encounter.   Current Outpatient Prescriptions  Medication Sig Dispense Refill  . albuterol (PROVENTIL HFA;VENTOLIN HFA) 108 (90 BASE) MCG/ACT inhaler Inhale 2 puffs into the lungs every 6 (six) hours as needed. For shortess of breath       . amoxicillin (AMOXIL) 500 MG capsule Take 1 capsule (500 mg total) by mouth 3 (three) times daily.  30  capsule  0  . aspirin 325 MG tablet Take 325 mg by mouth daily.      Marland Kitchen atorvastatin (LIPITOR) 40 MG tablet Take 40 mg by mouth daily.      Marland Kitchen CALCIUM PO Take 1 tablet by mouth daily.      . clonazePAM (KLONOPIN) 1 MG tablet Take 1 mg by mouth 2 (two) times daily as needed. For anxiety       . escitalopram (LEXAPRO) 10 MG tablet Take 10 mg by mouth daily.       . fish oil-omega-3 fatty acids 1000 MG capsule Take 1 g by mouth daily.      . fluticasone (FLONASE) 50 MCG/ACT nasal spray Place 2 sprays into the nose at bedtime.       . gabapentin (NEURONTIN) 600 MG tablet Take 600 mg by mouth 4 (four) times daily.        Marland Kitchen guaiFENesin-codeine 100-10 MG/5ML syrup Take 5 mLs by mouth 3 (three) times daily as needed for cough.  120 mL  0  . labetalol (NORMODYNE) 100 MG tablet Take 1 tablet (100 mg total) by mouth 2 (two) times daily.  60 tablet  1  .  lansoprazole (PREVACID) 15 MG capsule Take 15 mg by mouth daily.      Marland Kitchen levothyroxine (SYNTHROID, LEVOTHROID) 100 MCG tablet Take 100 mcg by mouth daily.        Marland Kitchen losartan (COZAAR) 50 MG tablet Take 50 mg by mouth daily.      Marland Kitchen oxyCODONE-acetaminophen (PERCOCET/ROXICET) 5-325 MG per tablet Take 1 tablet by mouth every 4 (four) hours as needed for pain.  10 tablet  0  . prasugrel (EFFIENT) 10 MG TABS Take 10 mg by mouth daily.      . PredniSONE 10 MG KIT 12 day dose pack po  1 kit  0  . traMADol (ULTRAM) 50 MG tablet Take 50 mg by mouth every 6 (six) hours as needed for pain.      Marland Kitchen triamcinolone cream (KENALOG) 0.1 % Apply topically 2 (two) times daily.  30 g  1  . zolpidem (AMBIEN) 5 MG tablet Take 5 mg by mouth at bedtime as needed. For sleep        Exam:  BP 160/85  Pulse 71  Temp(Src) 99.5 F (37.5 C) (Oral)  Resp 16  SpO2 94% Gen: Well NAD HEENT: EOMI,  MMM Lungs: Normal work of breathing. Prolonged expiratory phase and wheezing present bilateral Heart: RRR no MRG Abd: NABS, NT, ND Exts: Non edematous BL  LE, warm and well perfused.   No  results found for this or any previous visit (from the past 24 hour(s)). Dg Chest 2 View  04/22/2013   *RADIOLOGY REPORT*  Clinical Data: Cough, smoker, history of COPD, fever, initial encounter.  CHEST - 2 VIEW  Comparison: 09/22/2012; 05/08/2012; 05/20/2011  Findings:  Grossly unchanged cardiac silhouette and mediastinal contours.  The lungs remain hyperexpanded with flattening of the bilateral hemidiaphragms and mild diffuse slightly nodular thickening of the pulmonary interstitium.  Chronic left basilar linear opacities favored to represent atelectasis or scar.  No new focal airspace opacity.  No definite pleural effusion or pneumothorax.  No evidence of edema.  Unchanged bones.  IMPRESSION: 1.  Hyperexpanded lungs and bronchitic change without acute cardiopulmonary disease. 2.  Chronic left basilar opacities favored to represent atelectasis or scar   Original Report Authenticated By: Tacey Ruiz, MD   Following nebulizer treatment with Atrovent and albuterol patient had some improvement in symptoms. Her lung sounds improved a bit.   Assessment and Plan: 57 y.o. female with COPD exacerbation. No evidence for pneumonia. Plan to treat with longer course of prednisone, and albuterol. Additionally use amoxicillin as antibiotics to prevent readmission.  Discussed warning signs or symptoms. Please see discharge instructions. Patient expresses understanding. Encourage smoking cessation    Rodolph Bong, MD 04/22/13 [phone]

## 2013-04-28 ENCOUNTER — Other Ambulatory Visit: Payer: Self-pay | Admitting: *Deleted

## 2013-04-28 MED ORDER — ATORVASTATIN CALCIUM 40 MG PO TABS: ORAL_TABLET | ORAL | Status: DC

## 2013-04-28 NOTE — Telephone Encounter (Signed)
Rx was sent to pharmacy electronically. 

## 2013-05-19 ENCOUNTER — Other Ambulatory Visit: Payer: Self-pay | Admitting: Cardiovascular Disease

## 2013-05-19 NOTE — Telephone Encounter (Signed)
Rx was sent to pharmacy electronically. 

## 2013-06-02 ENCOUNTER — Other Ambulatory Visit: Payer: Self-pay | Admitting: Cardiovascular Disease

## 2013-06-07 ENCOUNTER — Ambulatory Visit: Payer: No Typology Code available for payment source | Attending: Internal Medicine | Admitting: Internal Medicine

## 2013-06-07 ENCOUNTER — Encounter: Payer: Self-pay | Admitting: Internal Medicine

## 2013-06-07 VITALS — BP 176/104 | HR 73 | Temp 99.1°F | Resp 16 | Ht 61.5 in | Wt 142.0 lb

## 2013-06-07 DIAGNOSIS — E785 Hyperlipidemia, unspecified: Secondary | ICD-10-CM | POA: Insufficient documentation

## 2013-06-07 DIAGNOSIS — J4489 Other specified chronic obstructive pulmonary disease: Secondary | ICD-10-CM

## 2013-06-07 DIAGNOSIS — J329 Chronic sinusitis, unspecified: Secondary | ICD-10-CM | POA: Insufficient documentation

## 2013-06-07 DIAGNOSIS — J438 Other emphysema: Secondary | ICD-10-CM

## 2013-06-07 DIAGNOSIS — G8929 Other chronic pain: Secondary | ICD-10-CM | POA: Insufficient documentation

## 2013-06-07 DIAGNOSIS — I10 Essential (primary) hypertension: Secondary | ICD-10-CM

## 2013-06-07 DIAGNOSIS — E039 Hypothyroidism, unspecified: Secondary | ICD-10-CM

## 2013-06-07 DIAGNOSIS — M549 Dorsalgia, unspecified: Secondary | ICD-10-CM

## 2013-06-07 DIAGNOSIS — K219 Gastro-esophageal reflux disease without esophagitis: Secondary | ICD-10-CM

## 2013-06-07 DIAGNOSIS — Z9861 Coronary angioplasty status: Secondary | ICD-10-CM

## 2013-06-07 DIAGNOSIS — J45909 Unspecified asthma, uncomplicated: Secondary | ICD-10-CM | POA: Insufficient documentation

## 2013-06-07 DIAGNOSIS — I2510 Atherosclerotic heart disease of native coronary artery without angina pectoris: Secondary | ICD-10-CM | POA: Insufficient documentation

## 2013-06-07 DIAGNOSIS — J449 Chronic obstructive pulmonary disease, unspecified: Secondary | ICD-10-CM

## 2013-06-07 DIAGNOSIS — J439 Emphysema, unspecified: Secondary | ICD-10-CM | POA: Insufficient documentation

## 2013-06-07 LAB — LIPID PANEL
HDL: 50 mg/dL (ref >39)
Total CHOL/HDL Ratio: 3.2 Ratio

## 2013-06-07 MED ORDER — ALBUTEROL SULFATE HFA 108 (90 BASE) MCG/ACT IN AERS: 2.0000 | INHALATION_SPRAY | Freq: Four times a day (QID) | RESPIRATORY_TRACT | Status: DC | PRN

## 2013-06-07 MED ORDER — ESOMEPRAZOLE MAGNESIUM 40 MG PO CPDR: 40.0000 mg | DELAYED_RELEASE_CAPSULE | Freq: Every day | ORAL | Status: DC

## 2013-06-07 MED ORDER — LABETALOL HCL 100 MG PO TABS: 100.0000 mg | ORAL_TABLET | Freq: Once | ORAL | Status: DC

## 2013-06-07 MED ORDER — GABAPENTIN 600 MG PO TABS: 600.0000 mg | ORAL_TABLET | Freq: Four times a day (QID) | ORAL | Status: DC

## 2013-06-07 MED ORDER — FLUTICASONE PROPIONATE 50 MCG/ACT NA SUSP: 2.0000 | Freq: Every day | NASAL | Status: DC

## 2013-06-07 MED ORDER — ATORVASTATIN CALCIUM 40 MG PO TABS: ORAL_TABLET | ORAL | Status: DC

## 2013-06-07 MED ORDER — LEVOTHYROXINE SODIUM 100 MCG PO TABS: 100.0000 ug | ORAL_TABLET | Freq: Every day | ORAL | Status: DC

## 2013-06-07 MED ORDER — FLUTICASONE-SALMETEROL 100-50 MCG/DOSE IN AEPB: 1.0000 | INHALATION_SPRAY | Freq: Two times a day (BID) | RESPIRATORY_TRACT | Status: DC

## 2013-06-07 MED ORDER — MONTELUKAST SODIUM 10 MG PO TABS: 10.0000 mg | ORAL_TABLET | Freq: Every day | ORAL | Status: DC

## 2013-06-07 MED ORDER — LOSARTAN POTASSIUM 50 MG PO TABS: 50.0000 mg | ORAL_TABLET | Freq: Every day | ORAL | Status: DC

## 2013-06-07 MED ORDER — FUROSEMIDE 20 MG PO TABS: 20.0000 mg | ORAL_TABLET | Freq: Every day | ORAL | Status: DC

## 2013-06-07 MED ORDER — TRAMADOL HCL 50 MG PO TABS: 50.0000 mg | ORAL_TABLET | Freq: Four times a day (QID) | ORAL | Status: DC | PRN

## 2013-06-07 NOTE — Progress Notes (Signed)
Patient ID: [name] [name], female   DOB: September 09, 1955, 57 y.o.   MRN: [id number] Patient Demographics  [name] [name], is a 57 y.o. female  [id number]  [id number]  DOB - Sep 20, 1955  CC:  Chief Complaint  Patient presents with  . Establish Care       HPI: [name] [name] is a 57 y.o. female here today to establish medical care. Patient has extensive medical history which include hypertension, coronary artery disease status post stenting 6 times, COPD with emphysema, chronic sinusitis, hypothyroidism, gastroesophageal reflux, chronic back pain and psoriasis. She run out of all her medications. She is here to refill medications and to establish medical care. She has no significant complaint today, no new pain or weakness or numbness. She continues to smoke cigarettes about 1 pack per day and she drinks alcohol.   Patient has No headache, No chest pain, No abdominal pain - No Nausea, No new weakness tingling or numbness, No Cough - SOB.  Allergies  Allergen Reactions  . Other Anaphylaxis    avacdos  . Codeine Nausea Only  . Latex     REACTION: shortness of breath and rash   Past Medical History  Diagnosis Date  . Thyroid disease   . Coronary artery disease   . Hernia   . Chronic back pain     herniated nucleus pulposus  . Hyperlipidemia     takes Lipitor daily  . Hypertension     takes Losartan daily and Labetalol bid  . Heart attack 2011  . Asthma   . COPD (chronic obstructive pulmonary disease)     early stages  . Shortness of breath     with exertion  . Bronchitis     couple of months ago  . Arthritis   . Joint pain   . Neck pain   . Bruises easily     pt is on Effient  . Psoriasis     elbows,knees,back  . H/O hiatal hernia   . GERD (gastroesophageal reflux disease)     takes Nexium daily  . Hemorrhoids   . Diverticulosis   . Slowing of urinary stream   . Hypothyroidism     takes Synthroid daily  . Depression     takes Klonopin daily  . Anxiety     takes  Lexapro daily  . Insomnia     hydroxyzine prn  . Bartholin gland cyst 08/29/2011   Current Outpatient Prescriptions on File Prior to Visit  Medication Sig Dispense Refill  . aspirin 325 MG tablet Take 325 mg by mouth daily.      Marland Kitchen CALCIUM PO Take 1 tablet by mouth daily.      . clonazePAM (KLONOPIN) 1 MG tablet Take 1 mg by mouth 2 (two) times daily as needed. For anxiety       . escitalopram (LEXAPRO) 10 MG tablet Take 10 mg by mouth daily.       . fish oil-omega-3 fatty acids 1000 MG capsule Take 1 g by mouth daily.      . prasugrel (EFFIENT) 10 MG TABS Take 10 mg by mouth daily.      Marland Kitchen triamcinolone cream (KENALOG) 0.1 % Apply topically 2 (two) times daily.  30 g  1  . zolpidem (AMBIEN) 5 MG tablet Take 5 mg by mouth at bedtime as needed. For sleep      . amoxicillin (AMOXIL) 500 MG capsule Take 1 capsule (500 mg total) by mouth 3 (three) times daily.  30 capsule  0  . guaiFENesin-codeine 100-10 MG/5ML syrup Take 5 mLs by mouth 3 (three) times daily as needed for cough.  120 mL  0  . oxyCODONE-acetaminophen (PERCOCET/ROXICET) 5-325 MG per tablet Take 1 tablet by mouth every 4 (four) hours as needed for pain.  10 tablet  0  . PredniSONE 10 MG KIT 12 day dose pack po  1 kit  0   No current facility-administered medications on file prior to visit.   Family History  Problem Relation Age of Onset  . Hypotension Neg Hx   . Malignant hyperthermia Neg Hx   . Pseudochol deficiency Neg Hx   . Anesthesia problems Daughter   . Heart disease Mother   . Hypertension Mother   . Stroke Mother   . Heart disease Father   . Hypertension Father   . Diabetes Father   . Cancer Maternal Aunt     breast  . Thyroid disease Paternal Aunt   . Heart disease Maternal Grandfather   . Cancer Paternal Grandmother     mouth   History   Social History  . Marital Status: Single    Spouse Name: N/A    Number of Children: N/A  . Years of Education: N/A   Occupational History  . Not on file.   Social  History Main Topics  . Smoking status: Current Every Day Smoker -- 1.00 packs/day for 44 years  . Smokeless tobacco: Not on file  . Alcohol Use: Yes     Comment: rarely  . Drug Use: No  . Sexual Activity: Not Currently    Birth Control/ Protection: Surgical   Other Topics Concern  . Not on file   Social History Narrative  . No narrative on file    Review of Systems: Constitutional: Negative for fever, chills, diaphoresis, activity change, appetite change and fatigue. HENT: Negative for ear pain, nosebleeds, congestion, facial swelling, rhinorrhea, neck pain, neck stiffness and ear discharge.  Eyes: Negative for pain, discharge, redness, itching and visual disturbance. Respiratory: Negative for cough, choking, chest tightness, shortness of breath, wheezing and stridor.  Cardiovascular: Negative for chest pain, palpitations and leg swelling. Gastrointestinal: Negative for abdominal distention. Genitourinary: Negative for dysuria, urgency, frequency, hematuria, flank pain, decreased urine volume, difficulty urinating and dyspareunia.  Musculoskeletal: back pain ++, no joint swelling, arthralgia and gait problem. Neurological: Negative for dizziness, tremors, seizures, syncope, facial asymmetry, speech difficulty, weakness, light-headedness, numbness and headaches.  Hematological: Negative for adenopathy. Does not bruise/bleed easily. Psychiatric/Behavioral: Negative for hallucinations, behavioral problems, confusion, dysphoric mood, decreased concentration and agitation.    Objective:   Filed Vitals:   06/07/13 1056  BP: 176/104  Pulse: 73  Temp: 99.1 F (37.3 C)  Resp: 16    Physical Exam: Constitutional: Patient appears well-developed and well-nourished. No distress. HENT: Normocephalic, atraumatic, External right and left ear normal. Oropharynx is clear and moist.  Eyes: Conjunctivae and EOM are normal. PERRLA, no scleral icterus. Neck: Normal ROM. Neck supple. No JVD. No  tracheal deviation. No thyromegaly. CVS: RRR, S1/S2 +, no murmurs, no gallops, no carotid bruit.  Pulmonary: Effort and breath sounds normal, no stridor, rhonchi, wheezes, rales.  Abdominal: Soft. BS +, no distension, tenderness, rebound or guarding.  Musculoskeletal: Normal range of motion. No edema and no tenderness.  Lymphadenopathy: No lymphadenopathy noted, cervical, inguinal or axillary Neuro: Alert. Normal reflexes, muscle tone coordination. No cranial nerve deficit. Skin: Psoriatic skin rash especially on the extensor surfaces of both elbows, Skin is warm and dry. No rash  noted. Not diaphoretic. No erythema. No pallor. Psychiatric: Normal mood and affect. Behavior, judgment, thought content normal.  Lab Results  Component Value Date   WBC 9.1 04/16/2012   HGB 12.4 04/16/2012   HCT 37.3 04/16/2012   MCV 96.9 04/16/2012   PLT 354 04/16/2012   Lab Results  Component Value Date   CREATININE 0.77 04/16/2012   BUN 14 04/16/2012   NA 139 04/16/2012   K 4.5 04/16/2012   CL 104 04/16/2012   CO2 26 04/16/2012    No results found for this basename: HGBA1C   Lipid Panel     Component Value Date/Time   CHOL  Value: 139        ATP III CLASSIFICATION:  <200     mg/dL   Desirable  161-096  mg/dL   Borderline High  >=045    mg/dL   High        10/21/8117 0610   TRIG 54 01/27/2010 0610   HDL 64 01/27/2010 0610   CHOLHDL 2.2 01/27/2010 0610   VLDL 11 01/27/2010 0610   LDLCALC  Value: 64        Total Cholesterol/HDL:CHD Risk Coronary Heart Disease Risk Table                     Men   Women  1/2 Average Risk   3.4   3.3  Average Risk       5.0   4.4  2 X Average Risk   9.6   7.1  3 X Average Risk  23.4   11.0        Use the calculated Patient Ratio above and the CHD Risk Table to determine the patient's CHD Risk.        ATP III CLASSIFICATION (LDL):  <100     mg/dL   Optimal  147-829  mg/dL   Near or Above                    Optimal  130-159  mg/dL   Borderline  562-130  mg/dL   High  >865     mg/dL   Very  High 7/84/6962 0610       Assessment and plan:  Give Lasix 40 mg tablet by mouth now for accelerated hypertension  1. Essential hypertension, benign - labetalol (NORMODYNE) 100 MG tablet; Take 1 tablet (100 mg total) by mouth once.  Dispense: 90 tablet; Refill: 3 - losartan (COZAAR) 50 MG tablet; Take 1 tablet (50 mg total) by mouth daily.  Dispense: 90 tablet; Refill: 3 - furosemide (LASIX) 20 MG tablet; Take 1 tablet (20 mg total) by mouth daily.  Dispense: 30 tablet; Refill: 3  2. Dyslipidemia - atorvastatin (LIPITOR) 40 MG tablet; Take 1 tablet by mouth daily. <Please make appointment for future refills>  Dispense: 90 tablet; Refill: 3 - Lipid Panel  3. Chronic obstructive asthma, unspecified - albuterol (PROVENTIL HFA;VENTOLIN HFA) 108 (90 BASE) MCG/ACT inhaler; Inhale 2 puffs into the lungs every 6 (six) hours as needed. For shortess of breath  Dispense: 1 Inhaler; Refill: 3 - Fluticasone-Salmeterol (ADVAIR) 100-50 MCG/DOSE AEPB; Inhale 1 puff into the lungs 2 (two) times daily.  Dispense: 1 each; Refill: 3  4. Emphysema lung - albuterol (PROVENTIL HFA;VENTOLIN HFA) 108 (90 BASE) MCG/ACT inhaler; Inhale 2 puffs into the lungs every 6 (six) hours as needed. For shortess of breath  Dispense: 1 Inhaler; Refill: 3  5. Chronic back pain - gabapentin (NEURONTIN) 600 MG  tablet; Take 1 tablet (600 mg total) by mouth 4 (four) times daily.  Dispense: 120 tablet; Refill: 3 - traMADol (ULTRAM) 50 MG tablet; Take 1 tablet (50 mg total) by mouth every 6 (six) hours as needed.  Dispense: 60 tablet; Refill: 1   6. Sinusitis, chronic - fluticasone (FLONASE) 50 MCG/ACT nasal spray; Place 2 sprays into both nostrils at bedtime.  Dispense: 16 g; Refill: 3 - montelukast (SINGULAIR) 10 MG tablet; Take 1 tablet (10 mg total) by mouth at bedtime.  Dispense: 30 tablet; Refill: 3  7. CAD S/P percutaneous coronary angioplasty Stable. Continue medications as prescribed Patient encouraged to keep  appointment with cardiologist  8. GERD (gastroesophageal reflux disease)  - esomeprazole (NEXIUM) 40 MG capsule; Take 1 capsule (40 mg total) by mouth daily.  Dispense: 30 capsule; Refill: 3  9. Unspecified hypothyroidism  - TSH - T4, Free - Vitamin D, 25-hydroxy  10. Psoriasis Triamcinolone cream  Follow up in 3 months or when necessary  The patient was given clear instructions to go to ER or return to medical center if symptoms don't improve, worsen or new problems develop. The patient verbalized understanding. The patient was told to call to get lab results if they haven't heard anything in the next week.     Jeanann Lewandowsky, MD, MHA, [facility] [place], [place] [phone]   06/07/2013, 11:47 AM

## 2013-06-07 NOTE — Patient Instructions (Signed)
Psoriasis Psoriasis is a common, long-lasting (chronic) inflammation of the skin. It affects both men and women equally, of all ages and all races. Psoriasis cannot be passed from person to person (not contagious). Psoriasis varies from mild to very severe. When severe, it can greatly affect your quality of life. Psoriasis is an inflammatory disorder affecting the skin as well as other organs including the joints (causing an arthritis). With psoriasis, the skin sheds its top layer of cells more rapidly than it does in someone without psoriasis. CAUSES  The cause of psoriasis is largely unknown. Genetics, your immune system, and the environment seem to play a role in causing psoriasis. Factors that can make psoriasis worse include:  Damage or trauma to the skin, such as cuts, scrapes, and sunburn. This damage often causes new areas of psoriasis (lesions).  Winter dryness and lack of sunlight.  Medicines such as lithium, beta-blockers, antimalarial drugs, ACE inhibitors, nonsteroidal anti-inflammatory drugs (ibuprofen, aspirin), and terbinafine. Let your caregiver know if you are taking any of these drugs.  Alcohol. Excessive alcohol use should be avoided if you have psoriasis. Drinking large amounts of alcohol can affect:  How well your psoriasis treatment works.  How safe your psoriasis treatment is.  Smoking. If you smoke, ask your caregiver for help to quit.  Stress.  Bacterial or viral infections.  Arthritis. Arthritis associated with psoriasis (psoriatic arthritis) affects less than 10% of patients with psoriasis. The arthritic intensity does not always match the skin psoriasis intensity. It is important to let your caregiver know if your joints hurt or if they are stiff. SYMPTOMS  The most common form of psoriasis begins with little red bumps that gradually become larger. The bumps begin to form scales that flake off easily. The lower layers of scales stick together. When these scales  are scratched or removed, the underlying skin is tender and bleeds easily. These areas then grow in size and may become large. Psoriasis often creates a rash that looks the same on both sides of the body (symmetrical). It often affects the elbows, knees, groin, genitals, arms, legs, scalp, and nails. Affected nails often have pitting, loosen, thicken, crumble, and are difficult to treat.  "Inverse psoriasis"occurs in the armpits, under breasts, in skin folds, and around the groin, buttocks, and genitals.  "Guttate psoriasis" generally occurs in children and young adults following a recent sore throat (strep throat). It begins with many small, red, scaly spots on the skin. It clears spontaneously in weeks or a few months without treatment. DIAGNOSIS  Psoriasis is diagnosed by physical exam. A tissue sample (biopsy) may also be taken. TREATMENT The treatment of psoriasis depends on your age, health, and living conditions.  Steroid (cortisone) creams, lotions, and ointments may be used. These treatments are associated with thinning of the skin, blood vessels that get larger (dilated), loss of skin pigmentation, and easy bruising. It is important to use these steroids as directed by your caregiver. Only treat the affected areas and not the normal, unaffected skin. People on long-term steroid treatment should wear a medical alert bracelet. Injections may be used in areas that are difficult to treat.  Scalp treatments are available as shampoos, solutions, sprays, foams, and oils. Avoid scratching the scalp and picking at the scales.  Anthralin medicine works well on areas that are difficult to treat. However, it stains clothes and skin and may cause temporary irritation.  Synthetic vitamin D (calcipotriene)can be used on small areas. It is available by prescription. The forms   of synthetic vitamin D available in health food stores do not help with psoriasis.  Coal tarsare available in various strengths  for psoriasis that is difficult to treat. They are one of the longest used treatments for difficult to treat psoriasis. However, they are messy to use.  Light therapy (UV therapy) can be carefully and professionally monitored in a dermatologist's office. Careful sunbathing is helpful for many people as directed by your caregiver. The exposure should be just long enough to cause a mild redness (erythema) of your skin. Avoid sunburn as this may make the condition worse. Sunscreen (SPF of 30 or higher) should be used to protect against sunburn. Cataracts, wrinkles, and skin aging are some of the harmful side effects of light therapy.  If creams (topical medicines) fail, there are several other options for systemic or oral medicines your caregiver can suggest. Psoriasis can sometimes be very difficult to treat. It can come and go. It is necessary to follow up with your caregiver regularly if your psoriasis is difficult to treat. Usually, with persistence you can get a good amount of relief. Maintaining consistent care is important. Do not change caregivers just because you do not see immediate results. It may take several trials to find the right combination of treatment for you. PREVENTING FLARE-UPS  Wear gloves while you wash dishes, while cleaning, and when you are outside in the cold.  If you have radiators, place a bowl of water or damp towel on the radiator. This will help put water back in the air. You can also use a humidifier to keep the air moist. Try to keep the humidity at about 60% in your home.  Apply moisturizer while your skin is still damp from bathing or showering. This traps water in the skin.  Avoid long, hot baths or showers. Keep soap use to a minimum. Soaps dry out the skin and wash away the protective oils. Use a fragrance free, dye free soap.  Drink enough water and fluids to keep your urine clear or pale yellow. Not drinking enough water depletes your skin's water  supply.  Turn off the heat at night and keep it low during the day. Cool air is less drying. SEEK MEDICAL CARE IF:  You have increasing pain in the affected areas.  You have uncontrolled bleeding in the affected areas.  You have increasing redness or warmth in the affected areas.  You start to have pain or stiffness in your joints.  You start feeling depressed about your condition.  You have a fever. Document Released: 06/28/2000 Document Revised: 09/23/2011 Document Reviewed: 12/24/2010 [facility] Patient Information 2014 [place], [place]. DASH Diet The DASH diet stands for "Dietary Approaches to Stop Hypertension." It is a healthy eating plan that has been shown to reduce high blood pressure (hypertension) in as little as 14 days, while also possibly providing other significant health benefits. These other health benefits include reducing the risk of breast cancer after menopause and reducing the risk of type 2 diabetes, heart disease, colon cancer, and stroke. Health benefits also include weight loss and slowing kidney failure in patients with chronic kidney disease.  DIET GUIDELINES  Limit salt (sodium). Your diet should contain less than 1500 mg of sodium daily.  Limit refined or processed carbohydrates. Your diet should include mostly whole grains. Desserts and added sugars should be used sparingly.  Include small amounts of heart-healthy fats. These types of fats include nuts, oils, and tub margarine. Limit saturated and trans fats. These fats have  been shown to be harmful in the body. CHOOSING FOODS  The following food groups are based on a 2000 calorie diet. See your Registered Dietitian for individual calorie needs. Grains and Grain Products (6 to 8 servings daily)  Eat More Often: Whole-wheat bread, brown rice, whole-grain or wheat pasta, quinoa, popcorn without added fat or salt (air popped).  Eat Less Often: White bread, white pasta, white rice, cornbread. Vegetables (4 to  5 servings daily)  Eat More Often: Fresh, frozen, and canned vegetables. Vegetables may be raw, steamed, roasted, or grilled with a minimal amount of fat.  Eat Less Often/Avoid: Creamed or fried vegetables. Vegetables in a cheese sauce. Fruit (4 to 5 servings daily)  Eat More Often: All fresh, canned (in natural juice), or frozen fruits. Dried fruits without added sugar. One hundred percent fruit juice ( cup [237 mL] daily).  Eat Less Often: Dried fruits with added sugar. Canned fruit in light or heavy syrup. [employer], Fish, and Poultry (2 servings or less daily. One serving is 3 to 4 oz [85-114 g]).  Eat More Often: Ninety percent or leaner ground beef, tenderloin, sirloin. Round cuts of beef, chicken breast, [place] breast. All fish. Grill, bake, or broil your meat. Nothing should be fried.  Eat Less Often/Avoid: Fatty cuts of meat, [place], or chicken leg, thigh, or wing. Fried cuts of meat or fish. Dairy (2 to 3 servings)  Eat More Often: Low-fat or fat-free milk, low-fat plain or light yogurt, reduced-fat or part-skim cheese.  Eat Less Often/Avoid: Milk (whole, 2%).Whole milk yogurt. Full-fat cheeses. Nuts, Seeds, and Legumes (4 to 5 servings per week)  Eat More Often: All without added salt.  Eat Less Often/Avoid: Salted nuts and seeds, canned beans with added salt. Fats and Sweets (limited)  Eat More Often: Vegetable oils, tub margarines without trans fats, sugar-free gelatin. Mayonnaise and salad dressings.  Eat Less Often/Avoid: Coconut oils, palm oils, butter, stick margarine, cream, half and half, cookies, candy, pie. FOR MORE INFORMATION The Dash Diet Eating Plan: www.dashdiet.org Document Released: 06/20/2011 Document Revised: 09/23/2011 Document Reviewed: 06/20/2011 [facility] Patient Information 2014 [place], [place]. Hypertension As your heart beats, it forces blood through your arteries. This force is your blood pressure. If the pressure is too high, it is called  hypertension (HTN) or high blood pressure. HTN is dangerous because you may have it and not know it. High blood pressure may mean that your heart has to work harder to pump blood. Your arteries may be narrow or stiff. The extra work puts you at risk for heart disease, stroke, and other problems.  Blood pressure consists of two numbers, a higher number over a lower, 110/72, for example. It is stated as "110 over 72." The ideal is below 120 for the top number (systolic) and under 80 for the bottom (diastolic). Write down your blood pressure today. You should pay close attention to your blood pressure if you have certain conditions such as:  Heart failure.  Prior heart attack.  Diabetes  Chronic kidney disease.  Prior stroke.  Multiple risk factors for heart disease. To see if you have HTN, your blood pressure should be measured while you are seated with your arm held at the level of the heart. It should be measured at least twice. A one-time elevated blood pressure reading (especially in the Emergency Department) does not mean that you need treatment. There may be conditions in which the blood pressure is different between your right and left arms. It is important  to see your caregiver soon for a recheck. Most people have essential hypertension which means that there is not a specific cause. This type of high blood pressure may be lowered by changing lifestyle factors such as:  Stress.  Smoking.  Lack of exercise.  Excessive weight.  Drug/tobacco/alcohol use.  Eating less salt. Most people do not have symptoms from high blood pressure until it has caused damage to the body. Effective treatment can often prevent, delay or reduce that damage. TREATMENT  When a cause has been identified, treatment for high blood pressure is directed at the cause. There are a large number of medications to treat HTN. These fall into several categories, and your caregiver will help you select the medicines  that are best for you. Medications may have side effects. You should review side effects with your caregiver. If your blood pressure stays high after you have made lifestyle changes or started on medicines,   Your medication(s) may need to be changed.  Other problems may need to be addressed.  Be certain you understand your prescriptions, and know how and when to take your medicine.  Be sure to follow up with your caregiver within the time frame advised (usually within two weeks) to have your blood pressure rechecked and to review your medications.  If you are taking more than one medicine to lower your blood pressure, make sure you know how and at what times they should be taken. Taking two medicines at the same time can result in blood pressure that is too low. SEEK IMMEDIATE MEDICAL CARE IF:  You develop a severe headache, blurred or changing vision, or confusion.  You have unusual weakness or numbness, or a faint feeling.  You have severe chest or abdominal pain, vomiting, or breathing problems. MAKE SURE YOU:   Understand these instructions.  Will watch your condition.  Will get help right away if you are not doing well or get worse. Document Released: 07/01/2005 Document Revised: 09/23/2011 Document Reviewed: 02/19/2008 [facility] Patient Information 2014 [place], [place].

## 2013-06-07 NOTE — Progress Notes (Signed)
Pt is here today to establish care. Pt has a sinus infection. She has been dealing with thoracic back pain for 6 years. She states that she has a hiatal hernia.

## 2013-06-08 LAB — VITAMIN D 25 HYDROXY (VIT D DEFICIENCY, FRACTURES): Vit D, 25-Hydroxy: 29 ng/mL — ABNORMAL LOW (ref 30–89)

## 2013-06-29 ENCOUNTER — Telehealth: Payer: Self-pay | Admitting: Cardiovascular Disease

## 2013-06-29 NOTE — Telephone Encounter (Signed)
Samples, (4wks) left at front desk

## 2013-06-29 NOTE — Telephone Encounter (Signed)
Patient called in for refill on Effient thru the medication assistance program.  She had not seen Dr. Tresa Endo since 2013.  I scheduled her for 07/26/13 @ 3PM.  Patient states that she will need samples of Effient because she is almost out.

## 2013-07-09 ENCOUNTER — Encounter (HOSPITAL_COMMUNITY): Payer: Self-pay | Admitting: Emergency Medicine

## 2013-07-09 ENCOUNTER — Emergency Department (INDEPENDENT_AMBULATORY_CARE_PROVIDER_SITE_OTHER)
Admission: EM | Admit: 2013-07-09 | Discharge: 2013-07-09 | Disposition: A | Discharge status: Nursing Home (Includes ICF) | Payer: No Typology Code available for payment source | Source: Home / Self Care | Attending: Emergency Medicine | Admitting: Emergency Medicine

## 2013-07-09 ENCOUNTER — Emergency Department (HOSPITAL_COMMUNITY)
Admission: EM | Admit: 2013-07-09 | Discharge: 2013-07-10 | Disposition: A | Discharge status: Other Acute Inpatient Hospital | Payer: No Typology Code available for payment source | Attending: Emergency Medicine | Admitting: Emergency Medicine

## 2013-07-09 DIAGNOSIS — I10 Essential (primary) hypertension: Secondary | ICD-10-CM | POA: Insufficient documentation

## 2013-07-09 DIAGNOSIS — F1010 Alcohol abuse, uncomplicated: Secondary | ICD-10-CM

## 2013-07-09 DIAGNOSIS — Z9861 Coronary angioplasty status: Secondary | ICD-10-CM | POA: Insufficient documentation

## 2013-07-09 DIAGNOSIS — F22 Delusional disorders: Secondary | ICD-10-CM | POA: Insufficient documentation

## 2013-07-09 DIAGNOSIS — R45851 Suicidal ideations: Secondary | ICD-10-CM

## 2013-07-09 DIAGNOSIS — J449 Chronic obstructive pulmonary disease, unspecified: Secondary | ICD-10-CM | POA: Insufficient documentation

## 2013-07-09 DIAGNOSIS — F329 Major depressive disorder, single episode, unspecified: Secondary | ICD-10-CM

## 2013-07-09 DIAGNOSIS — IMO0002 Reserved for concepts with insufficient information to code with codable children: Secondary | ICD-10-CM | POA: Insufficient documentation

## 2013-07-09 DIAGNOSIS — Z91040 Latex allergy status: Secondary | ICD-10-CM | POA: Insufficient documentation

## 2013-07-09 DIAGNOSIS — F1410 Cocaine abuse, uncomplicated: Secondary | ICD-10-CM | POA: Insufficient documentation

## 2013-07-09 DIAGNOSIS — Z9119 Patient's noncompliance with other medical treatment and regimen: Secondary | ICD-10-CM | POA: Insufficient documentation

## 2013-07-09 DIAGNOSIS — Z792 Long term (current) use of antibiotics: Secondary | ICD-10-CM | POA: Insufficient documentation

## 2013-07-09 DIAGNOSIS — G4700 Insomnia, unspecified: Secondary | ICD-10-CM | POA: Insufficient documentation

## 2013-07-09 DIAGNOSIS — E785 Hyperlipidemia, unspecified: Secondary | ICD-10-CM | POA: Insufficient documentation

## 2013-07-09 DIAGNOSIS — G8929 Other chronic pain: Secondary | ICD-10-CM | POA: Insufficient documentation

## 2013-07-09 DIAGNOSIS — Z8674 Personal history of sudden cardiac arrest: Secondary | ICD-10-CM | POA: Insufficient documentation

## 2013-07-09 DIAGNOSIS — F17200 Nicotine dependence, unspecified, uncomplicated: Secondary | ICD-10-CM | POA: Insufficient documentation

## 2013-07-09 DIAGNOSIS — F32A Depression, unspecified: Secondary | ICD-10-CM

## 2013-07-09 DIAGNOSIS — F411 Generalized anxiety disorder: Secondary | ICD-10-CM | POA: Insufficient documentation

## 2013-07-09 DIAGNOSIS — Z79899 Other long term (current) drug therapy: Secondary | ICD-10-CM | POA: Insufficient documentation

## 2013-07-09 DIAGNOSIS — Z8742 Personal history of other diseases of the female genital tract: Secondary | ICD-10-CM | POA: Insufficient documentation

## 2013-07-09 DIAGNOSIS — Z872 Personal history of diseases of the skin and subcutaneous tissue: Secondary | ICD-10-CM | POA: Insufficient documentation

## 2013-07-09 DIAGNOSIS — I2510 Atherosclerotic heart disease of native coronary artery without angina pectoris: Secondary | ICD-10-CM | POA: Insufficient documentation

## 2013-07-09 DIAGNOSIS — Z7982 Long term (current) use of aspirin: Secondary | ICD-10-CM | POA: Insufficient documentation

## 2013-07-09 DIAGNOSIS — K219 Gastro-esophageal reflux disease without esophagitis: Secondary | ICD-10-CM | POA: Insufficient documentation

## 2013-07-09 DIAGNOSIS — M129 Arthropathy, unspecified: Secondary | ICD-10-CM | POA: Insufficient documentation

## 2013-07-09 DIAGNOSIS — E039 Hypothyroidism, unspecified: Secondary | ICD-10-CM | POA: Insufficient documentation

## 2013-07-09 DIAGNOSIS — F1910 Other psychoactive substance abuse, uncomplicated: Secondary | ICD-10-CM

## 2013-07-09 DIAGNOSIS — J4489 Other specified chronic obstructive pulmonary disease: Secondary | ICD-10-CM | POA: Insufficient documentation

## 2013-07-09 DIAGNOSIS — Z91199 Patient's noncompliance with other medical treatment and regimen due to unspecified reason: Secondary | ICD-10-CM | POA: Insufficient documentation

## 2013-07-09 LAB — CBC
MCV: 98.2 fL (ref 78.0–100.0)
Platelets: 338 10*3/uL (ref 150–400)
RDW: 13 % (ref 11.5–15.5)
WBC: 7.5 10*3/uL (ref 4.0–10.5)

## 2013-07-09 LAB — BASIC METABOLIC PANEL
Calcium: 9.4 mg/dL (ref 8.4–10.5)
Chloride: 102 mEq/L (ref 96–112)
Creatinine, Ser: 0.86 mg/dL (ref 0.50–1.10)
GFR calc Af Amer: 85 mL/min — ABNORMAL LOW (ref >90)

## 2013-07-09 LAB — URINALYSIS, ROUTINE W REFLEX MICROSCOPIC
Ketones, ur: NEGATIVE mg/dL
Leukocytes, UA: NEGATIVE
Nitrite: NEGATIVE
Protein, ur: NEGATIVE mg/dL
Urobilinogen, UA: 1 mg/dL (ref 0.0–1.0)
pH: 7 (ref 5.0–8.0)

## 2013-07-09 LAB — ACETAMINOPHEN LEVEL: Acetaminophen (Tylenol), Serum: <15 ug/mL (ref 10–30)

## 2013-07-09 LAB — ETHANOL: Alcohol, Ethyl (B): <11 mg/dL (ref 0–11)

## 2013-07-09 MED ORDER — ACETAMINOPHEN 325 MG PO TABS: 650.0000 mg | ORAL_TABLET | ORAL | Status: DC | PRN

## 2013-07-09 MED ORDER — FLUTICASONE PROPIONATE 50 MCG/ACT NA SUSP
2.0000 | Freq: Every day | NASAL | Status: DC
  Administered 2013-07-09: 2 via NASAL
  Filled 2013-07-09: qty 16

## 2013-07-09 MED ORDER — LABETALOL HCL 100 MG PO TABS: 100.0000 mg | ORAL_TABLET | Freq: Once | ORAL | Status: DC

## 2013-07-09 MED ORDER — ASPIRIN 325 MG PO TABS
325.0000 mg | ORAL_TABLET | Freq: Every day | ORAL | Status: DC
  Administered 2013-07-09: 325 mg via ORAL
  Filled 2013-07-09: qty 1

## 2013-07-09 MED ORDER — PANTOPRAZOLE SODIUM 40 MG PO TBEC
40.0000 mg | DELAYED_RELEASE_TABLET | Freq: Every day | ORAL | Status: DC
  Administered 2013-07-09: 40 mg via ORAL
  Filled 2013-07-09: qty 1

## 2013-07-09 MED ORDER — ALUM & MAG HYDROXIDE-SIMETH 200-200-20 MG/5ML PO SUSP: 30.0000 mL | ORAL | Status: DC | PRN

## 2013-07-09 MED ORDER — ALBUTEROL SULFATE HFA 108 (90 BASE) MCG/ACT IN AERS: 2.0000 | INHALATION_SPRAY | Freq: Four times a day (QID) | RESPIRATORY_TRACT | Status: DC | PRN

## 2013-07-09 MED ORDER — GABAPENTIN 600 MG PO TABS
600.0000 mg | ORAL_TABLET | Freq: Four times a day (QID) | ORAL | Status: DC
  Administered 2013-07-09: 600 mg via ORAL
  Filled 2013-07-09 (×2): qty 1

## 2013-07-09 MED ORDER — FUROSEMIDE 20 MG PO TABS: 20.0000 mg | ORAL_TABLET | Freq: Every day | ORAL | Status: DC

## 2013-07-09 MED ORDER — CLONAZEPAM 0.5 MG PO TABS
1.0000 mg | ORAL_TABLET | Freq: Once | ORAL | Status: AC
  Administered 2013-07-09: 1 mg via ORAL
  Filled 2013-07-09: qty 2

## 2013-07-09 MED ORDER — IBUPROFEN 200 MG PO TABS: 600.0000 mg | ORAL_TABLET | Freq: Three times a day (TID) | ORAL | Status: DC | PRN

## 2013-07-09 MED ORDER — MOMETASONE FURO-FORMOTEROL FUM 100-5 MCG/ACT IN AERO
2.0000 | INHALATION_SPRAY | Freq: Two times a day (BID) | RESPIRATORY_TRACT | Status: DC
  Administered 2013-07-09: 2 via RESPIRATORY_TRACT
  Filled 2013-07-09: qty 8.8

## 2013-07-09 MED ORDER — ATORVASTATIN CALCIUM 40 MG PO TABS: 40.0000 mg | ORAL_TABLET | Freq: Every day | ORAL | Status: DC

## 2013-07-09 MED ORDER — LORAZEPAM 1 MG PO TABS: 1.0000 mg | ORAL_TABLET | Freq: Three times a day (TID) | ORAL | Status: DC | PRN

## 2013-07-09 MED ORDER — ESCITALOPRAM OXALATE 10 MG PO TABS
10.0000 mg | ORAL_TABLET | Freq: Every day | ORAL | Status: DC
  Administered 2013-07-09: 10 mg via ORAL
  Filled 2013-07-09: qty 1

## 2013-07-09 MED ORDER — CLONAZEPAM 0.5 MG PO TABS: 1.0000 mg | ORAL_TABLET | Freq: Two times a day (BID) | ORAL | Status: DC | PRN

## 2013-07-09 MED ORDER — NICOTINE 21 MG/24HR TD PT24
21.0000 mg | MEDICATED_PATCH | Freq: Every day | TRANSDERMAL | Status: DC
  Administered 2013-07-09: 21 mg via TRANSDERMAL
  Filled 2013-07-09: qty 1

## 2013-07-09 MED ORDER — ONDANSETRON HCL 4 MG PO TABS: 4.0000 mg | ORAL_TABLET | Freq: Three times a day (TID) | ORAL | Status: DC | PRN

## 2013-07-09 NOTE — ED Provider Notes (Signed)
Chief Complaint:  No chief complaint on file.   History of Present Illness:   [name] [name] is a 57 year old female with a history of depression and anxiety who has been followed at the [facility] mental health clinic. She presents with a one-week history of increasing depression, anxiety, suicidal ideation, and uncontrollable crying. She attributes this to multiple stressors including loss of her job and the fact that mother has Alzheimer's disease. The patient states that she has been drinking alcohol to excess and has started using crack cocaine again. She denies any recent overdose or prior history of overdose or suicidal attempts. She has never been hospitalized for mental health problems in the past. The patient denies any visual loose nations but does sometimes hear voices. She denies any physical complaints right now.  Review of Systems:  Other than noted above, the patient denies any of the following symptoms: Systemic:  No fever, chills, sweats, fatigue, weight loss or gain. Resp:  No shortness of breath. Cardiovasc:  No chest pain, tightness, pressure, palpitations, dizziness, or syncope. GI:  No abdominal pain, nausea, vomiting, anorexia, diarrhea, or constipation. Neuro:  No headache, paresthesias, tremor, or muscle weakness. Psych:  No sadness, depression, crying, anxiety, panic, sleep disturbance, or suicidal or homicidal ideation.  No hallucinations or delusions.  PMFSH:  Past medical history, family history, social history, meds, and allergies were reviewed.  She has no medication allergies. She is on multiple medications including albuterol, aspirin, Lipitor, clonazepam, Lexapro, and Nexium, omega-3 fatty acids, Flonase, Advair, Lasix, Neurontin, Normodyne, Synthroid, Cozaar, Singulair, Percocet, Effient, prednisone, tramadol, and Ambien. She has a history of thyroid disease, coronary artery disease, status post MI and multiple stents, reflux esophagitis, hyperlipidemia,  hypertension, asthma, and COPD. She also has psoriatic arthritis.  Physical Exam:   Vital signs:  There were no vitals taken for this visit. Gen:  Alert, oriented, the patient is continuously tearful and upset. Lungs:  No respiratory distress.  Breath sounds clear and equal bilaterally.  No wheezes, rales, or rhonchi. Heart:  Regular rthythm.  No gallops, murmers, clicks or rubs. Abdomen:  Soft, flat and nontender.  No organomegaly or mass. Neuro:  Alert and oriented times 3. Speech clear, fluent and appropriate.  Cranial nerves intact.  No focal weakness. Psych:  Mood is depressed with tearfulness.  Speech pattern normal.  Thought content reveals suicidal ideation but no homicidal ideation.  No paranoia, she does have auditory hallucinations, denies any delusions or visual hallucinations.  Memory, insight, and judgement normal.  Course in Urgent Care Center:   She was wanded by security and was dressed in disposable hospital scrubs. She will need to be transported by shuttle with security present to the hospital for an ACT team evaluation.  Assessment:  The primary encounter diagnosis was Depression. Diagnoses of Alcohol abuse, Cocaine abuse, and Suicidal ideation were also pertinent to this visit.   Plan:  The patient was transferred to the ED via shuttle in stable condition.  Medical Decision Making:  57 year old female with history of depression and anxiety followed at [facility]. Mental Health has a 1 week history of increasing depression, anxiety, tearfulness, and suicidal ideation.  Patient attributes this multiple stressors: lost job, mother has Alzheimer's.  She has been drinking to excess and has used crack cocaine.  Appears tearful and upset.  She also states she's been hearing voices.  Will need ACT team consult.      Reuben Likes, MD 07/09/13 1247

## 2013-07-09 NOTE — ED Notes (Signed)
Pt sent from [facility].  Pt states "someone is out to get me, but I don't know why".  Pt states she recently lost her job and got a DUI.  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  Pt is looking around room as if she is looking for someone and crying.  Pt states she ran out of Lexapro and sleeping medicine and hasn't been able to sleep.  Pt is afraid the person that is chasing is going to get her although she doesn't know who is chasing her.  Pt brought by security and has been wanded and placed in paper scrubs.

## 2013-07-09 NOTE — Accreditation Note (Signed)
My colleague-Kristen received a call stating that a TA needed to be completed on this patient.  Writer called to scheduled the TA with this patient for 1630, however; per nursing secretary patient will have to be moved. Sts that where patient is at in the ED the machine will not work due to no signal. The patient's nurse is unable to move patient at this time to a location in the ED that has a signal. Unfortunately patient's nurse is  dealing with another patients that has become critical.    Writer asked that the telepsych machine is placed in patient's room once she is moved to a room with a signal. Writer asked the patient's nurse or nursing secretary calls this Clinical research associate when patient is ready to have the tele assessment completed.   Writer has already ready received report/clinicals from the examining PA-Abigail prior to seeing this patient.

## 2013-07-09 NOTE — ED Notes (Signed)
If a decision to transfer patient tonight is made please call Tiffany at (cell) [phone]

## 2013-07-09 NOTE — ED Notes (Signed)
Patient has bed at [facility] bed 502 Bed 1

## 2013-07-09 NOTE — ED Provider Notes (Signed)
CSN: [id number]     Arrival date & time 07/09/13  1254 History   First MD Initiated Contact with Patient 07/09/13 1327     Chief Complaint  Patient presents with  . Paranoid  . Medical Clearance   (Consider location/radiation/quality/duration/timing/severity/associated sxs/prior Treatment) HPI  This is a 57 year old female sent from urgent care Center chief complaint of severe depression and  paranoid thoughts.  The patient states that she is followed at [facility] for her depression.  She acknowledges noncompliance with her medications.  She states she has had worsening depression for the past year and loss of interest in activities.  Over the past few months the patient has returned to using crack cocaine and drinking alcohol.  She states a period of sobriety for the past 8 years before that.  The patient recently lost her job and got a DUI last week.  She states her license was taken away from her.  The patient states that she is "fat and crazy."  She complains that 2 nights ago there was someone who was looking through her window and came into her room.  She describes it as a shadow figure.  She cries and appears frightened when speaking about the incident.  The patient denies a history of visual or auditory hallucinations.  She denies any previous hospitalizations for psychiatric disorder.  Patient states that she smoked crack last night.  She states she has not slept in the past several days.  She notes as loss of appetite and she feels suicidal.  Patient did not have access to weapons and has no plan.  She denies homicidal ideation.  Past Medical History  Diagnosis Date  . Thyroid disease   . Coronary artery disease   . Hernia   . Chronic back pain     herniated nucleus pulposus  . Hyperlipidemia     takes Lipitor daily  . Hypertension     takes Losartan daily and Labetalol bid  . Heart attack 2011  . Asthma   . COPD (chronic obstructive pulmonary disease)     early stages  .  Shortness of breath     with exertion  . Bronchitis     couple of months ago  . Arthritis   . Joint pain   . Neck pain   . Bruises easily     pt is on Effient  . Psoriasis     elbows,knees,back  . H/O hiatal hernia   . GERD (gastroesophageal reflux disease)     takes Nexium daily  . Hemorrhoids   . Diverticulosis   . Slowing of urinary stream   . Hypothyroidism     takes Synthroid daily  . Depression     takes Klonopin daily  . Anxiety     takes Lexapro daily  . Insomnia     hydroxyzine prn  . Bartholin gland cyst 08/29/2011   Past Surgical History  Procedure Laterality Date  . Abdominal hysterectomy  1977  . Laparoscopy  1977    exploratory  . Tonsillectomy      as a child  . Coronary angioplasty with stent placement  2011    x 2  . Colonoscopy    . Esophagogastroduodenscoy    . Lumbar laminectomy/decompression microdiscectomy  08/05/2011    Procedure: LUMBAR LAMINECTOMY/DECOMPRESSION MICRODISCECTOMY;  Surgeon: Clydene Fake, MD;  Location: [facility];  Service: Neurosurgery;  Laterality: Right;  Right Lumbar four-five extraforaminal discectomy   Family History  Problem Relation Age of Onset  .  Hypotension Neg Hx   . Malignant hyperthermia Neg Hx   . Pseudochol deficiency Neg Hx   . Anesthesia problems Daughter   . Heart disease Mother   . Hypertension Mother   . Stroke Mother   . Heart disease Father   . Hypertension Father   . Diabetes Father   . Cancer Maternal Aunt     breast  . Thyroid disease Paternal Aunt   . Heart disease Maternal Grandfather   . Cancer Paternal Grandmother     mouth   History  Substance Use Topics  . Smoking status: Current Every Day Smoker -- 1.00 packs/day for 44 years    Types: Cigarettes  . Smokeless tobacco: Not on file  . Alcohol Use: Yes     Comment: alcohol last night   OB History   Grav Para Term Preterm Abortions TAB SAB Ect Mult Living   1 1 1  0 0 0 0 0 0 1     Review of Systems Ten systems reviewed and  are negative for acute change, except as noted in the HPI.  Allergies  Other; Codeine; and Latex  Home Medications   Current Outpatient Rx  Name  Route  Sig  Dispense  Refill  . albuterol (PROVENTIL HFA;VENTOLIN HFA) 108 (90 BASE) MCG/ACT inhaler   Inhalation   Inhale 2 puffs into the lungs every 6 (six) hours as needed. For shortess of breath   1 Inhaler   3   . amoxicillin (AMOXIL) 500 MG capsule   Oral   Take 1 capsule (500 mg total) by mouth 3 (three) times daily.   30 capsule   0   . aspirin 325 MG tablet   Oral   Take 325 mg by mouth daily.         Marland Kitchen atorvastatin (LIPITOR) 40 MG tablet      Take 1 tablet by mouth daily. <Please make appointment for future refills>   90 tablet   3   . CALCIUM PO   Oral   Take 1 tablet by mouth daily.         . clonazePAM (KLONOPIN) 1 MG tablet   Oral   Take 1 mg by mouth 2 (two) times daily as needed. For anxiety          . escitalopram (LEXAPRO) 10 MG tablet   Oral   Take 10 mg by mouth daily.          Marland Kitchen esomeprazole (NEXIUM) 40 MG capsule   Oral   Take 1 capsule (40 mg total) by mouth daily.   30 capsule   3   . fish oil-omega-3 fatty acids 1000 MG capsule   Oral   Take 1 g by mouth daily.         . fluticasone (FLONASE) 50 MCG/ACT nasal spray   Each Nare   Place 2 sprays into both nostrils at bedtime.   16 g   3   . Fluticasone-Salmeterol (ADVAIR) 100-50 MCG/DOSE AEPB   Inhalation   Inhale 1 puff into the lungs 2 (two) times daily.   1 each   3   . furosemide (LASIX) 20 MG tablet   Oral   Take 1 tablet (20 mg total) by mouth daily.   30 tablet   3   . gabapentin (NEURONTIN) 600 MG tablet   Oral   Take 1 tablet (600 mg total) by mouth 4 (four) times daily.   120 tablet   3   .  guaiFENesin-codeine 100-10 MG/5ML syrup   Oral   Take 5 mLs by mouth 3 (three) times daily as needed for cough.   120 mL   0   . labetalol (NORMODYNE) 100 MG tablet   Oral   Take 1 tablet (100 mg total) by  mouth once.   90 tablet   3     PATIENT NEEDS AN APPOINTMENT FOR FUTURE REFILLS.   Marland Kitchen levothyroxine (SYNTHROID, LEVOTHROID) 100 MCG tablet   Oral   Take 1 tablet (100 mcg total) by mouth daily.   90 tablet   3   . losartan (COZAAR) 50 MG tablet   Oral   Take 1 tablet (50 mg total) by mouth daily.   90 tablet   3   . montelukast (SINGULAIR) 10 MG tablet   Oral   Take 1 tablet (10 mg total) by mouth at bedtime.   30 tablet   3   . oxyCODONE-acetaminophen (PERCOCET/ROXICET) 5-325 MG per tablet   Oral   Take 1 tablet by mouth every 4 (four) hours as needed for pain.   10 tablet   0   . prasugrel (EFFIENT) 10 MG TABS   Oral   Take 10 mg by mouth daily.         . PredniSONE 10 MG KIT      12 day dose pack po   1 kit   0   . traMADol (ULTRAM) 50 MG tablet   Oral   Take 1 tablet (50 mg total) by mouth every 6 (six) hours as needed.   60 tablet   1   . triamcinolone cream (KENALOG) 0.1 %   Topical   Apply topically 2 (two) times daily.   30 g   1   . zolpidem (AMBIEN) 5 MG tablet   Oral   Take 5 mg by mouth at bedtime as needed. For sleep          BP 162/75  Pulse 82  Temp(Src) 98.5 F (36.9 C) (Oral)  Resp 20  SpO2 97% Physical Exam  Nursing note and vitals reviewed. Constitutional: She is oriented to person, place, and time. She appears well-developed and well-nourished. No distress.  57 year old female appearing agitated.  She appears older than her stated age.  Tearful   HENT:  Head: Normocephalic and atraumatic.  Edentulous  Eyes: Conjunctivae are normal. No scleral icterus.  Neck: Normal range of motion.  Cardiovascular: Normal rate, regular rhythm and normal heart sounds.  Exam reveals no gallop and no friction rub.   No murmur heard. Pulmonary/Chest: Effort normal and breath sounds normal. No respiratory distress.  Abdominal: Soft. Bowel sounds are normal. She exhibits no distension and no mass. There is no tenderness. There is no guarding.   Neurological: She is alert and oriented to person, place, and time.  Skin: Skin is warm and dry. She is not diaphoretic.  Psychiatric: Her mood appears anxious. She is agitated. Thought content is paranoid. She exhibits a depressed mood. She expresses suicidal ideation.  Patient appears agitated.  Poor eye contact with provider.  She appears to be responding to internal stimuli.  Eyes dart around room.  She appears frightened.    ED Course  Procedures (including critical care time) Labs Review Labs Reviewed - No data to display Imaging Review No results found.  EKG Interpretation   None       MDM   1. MDD (major depressive disorder)   2. Paranoid psychosis   3. Substance  abuse    2:21 PM BP 162/75  Pulse 82  Temp(Src) 98.5 F (36.9 C) (Oral)  Resp 20  SpO2 97% Patient with polysubstance abuse, alcohol abuse, major depression, paranoid delusion and visual hallucinations.  Lab work is currently pending.  The patient milligram of Ativan to calm her.  She will likely need inpatient psychiatric care.  Patient appears ready medically clear for psych eval.  Arthor Captain, PA-C 07/09/13 2133

## 2013-07-09 NOTE — ED Notes (Signed)
All belongings given to daughter [name]) [phone].

## 2013-07-09 NOTE — ED Notes (Signed)
Pt triaged and assessed by provider 

## 2013-07-09 NOTE — ED Notes (Signed)
TTS consult in progress. °

## 2013-07-09 NOTE — ED Notes (Signed)
Pt sts smoked "2 rocks" of crack last night with ETOH intake.

## 2013-07-09 NOTE — ED Notes (Signed)
[facility] called for transport.  Stated it should be about 30 mins.

## 2013-07-09 NOTE — BH Assessment (Signed)
Assessment Note  [name] [name] is an 57 y.o. female with a history of depression and anxiety who has been followed at the [facility] mental health clinic. She presents with a 1-2 week history of increasing depression, anxiety, suicidal ideation, and uncontrollable crying. Patient also paranoid with complaints of shadows and people walking in her room at night. Onset of the visual hallucinations started 2 days ago. She denies history of AVH's. Patient has no suicidal plan at this time but sts she is unable to contract for safety. She attributes this to multiple stressors including loss of her job and the fact that mother has Alzheimer's disease. The patient states that she has been drinking alcohol to excess and has started using crack cocaine again. She reports 8 yrs of sobriety until 2 weeks ago.  She has never been hospitalized for mental health problems in the past. However, she has received inpatient rehab at [facility] and [facility] for her history of drug use in the past (approx. 2006 -2007). Patient also received a recent DUI with a upcoming court date 08/10/2012. Marland Kitchen   Axis I: Major Depression, Recurrent severe with psychotic features Axis II: Deferred Axis III:  Past Medical History  Diagnosis Date  . Thyroid disease   . Coronary artery disease   . Hernia   . Chronic back pain     herniated nucleus pulposus  . Hyperlipidemia     takes Lipitor daily  . Hypertension     takes Losartan daily and Labetalol bid  . Heart attack 2011  . Asthma   . COPD (chronic obstructive pulmonary disease)     early stages  . Shortness of breath     with exertion  . Bronchitis     couple of months ago  . Arthritis   . Joint pain   . Neck pain   . Bruises easily     pt is on Effient  . Psoriasis     elbows,knees,back  . H/O hiatal hernia   . GERD (gastroesophageal reflux disease)     takes Nexium daily  . Hemorrhoids   . Diverticulosis   . Slowing of urinary stream   . Hypothyroidism     takes  Synthroid daily  . Depression     takes Klonopin daily  . Anxiety     takes Lexapro daily  . Insomnia     hydroxyzine prn  . Bartholin gland cyst 08/29/2011   Axis IV: occupational problems, other psychosocial or environmental problems, problems related to social environment, problems with access to health care services and problems with primary support group Axis V: 31-40 impairment in reality testing  Past Medical History:  Past Medical History  Diagnosis Date  . Thyroid disease   . Coronary artery disease   . Hernia   . Chronic back pain     herniated nucleus pulposus  . Hyperlipidemia     takes Lipitor daily  . Hypertension     takes Losartan daily and Labetalol bid  . Heart attack 2011  . Asthma   . COPD (chronic obstructive pulmonary disease)     early stages  . Shortness of breath     with exertion  . Bronchitis     couple of months ago  . Arthritis   . Joint pain   . Neck pain   . Bruises easily     pt is on Effient  . Psoriasis     elbows,knees,back  . H/O hiatal hernia   .  GERD (gastroesophageal reflux disease)     takes Nexium daily  . Hemorrhoids   . Diverticulosis   . Slowing of urinary stream   . Hypothyroidism     takes Synthroid daily  . Depression     takes Klonopin daily  . Anxiety     takes Lexapro daily  . Insomnia     hydroxyzine prn  . Bartholin gland cyst 08/29/2011    Past Surgical History  Procedure Laterality Date  . Abdominal hysterectomy  1977  . Laparoscopy  1977    exploratory  . Tonsillectomy      as a child  . Coronary angioplasty with stent placement  2011    x 2  . Colonoscopy    . Esophagogastroduodenscoy    . Lumbar laminectomy/decompression microdiscectomy  08/05/2011    Procedure: LUMBAR LAMINECTOMY/DECOMPRESSION MICRODISCECTOMY;  Surgeon: Clydene Fake, MD;  Location: [facility];  Service: Neurosurgery;  Laterality: Right;  Right Lumbar four-five extraforaminal discectomy    Family History:  Family History   Problem Relation Age of Onset  . Hypotension Neg Hx   . Malignant hyperthermia Neg Hx   . Pseudochol deficiency Neg Hx   . Anesthesia problems Daughter   . Heart disease Mother   . Hypertension Mother   . Stroke Mother   . Heart disease Father   . Hypertension Father   . Diabetes Father   . Cancer Maternal Aunt     breast  . Thyroid disease Paternal Aunt   . Heart disease Maternal Grandfather   . Cancer Paternal Grandmother     mouth    Social History:  reports that she has been smoking Cigarettes.  She has a 44 pack-year smoking history. She does not have any smokeless tobacco history on file. She reports that she drinks alcohol. She reports that she uses illicit drugs (Cocaine).  Additional Social History:  Alcohol / Drug Use Pain Medications: SEE MAR Prescriptions: SEE MAR Over the Counter: SEE MAR History of alcohol / drug use?: Yes Substance #1 Name of Substance 1: Alcohol  1 - Age of First Use: 57 yrs old  1 - Amount (size/oz): 3-4 beers per drinking episode 1 - Frequency: 3-4x's per week  1 - Duration: 2 weeks  1 - Last Use / Amount: "last night" Substance #2 Name of Substance 2: Cocaine  2 - Age of First Use: Pt sts, "I don't remember" 2 - Amount (size/oz): "small use" 2 - Frequency: 2x's in the past 2 weeks   2 - Duration: 2 weeks  2 - Last Use / Amount: "last night"  CIWA: CIWA-Ar BP: 129/84 mmHg Pulse Rate: 84 COWS:    Allergies:  Allergies  Allergen Reactions  . Other Anaphylaxis    avacdos  . Codeine Nausea Only  . Latex     REACTION: shortness of breath and rash    Home Medications:  (Not in a hospital admission)  OB/GYN Status:  No LMP recorded. Patient has had a hysterectomy.  General Assessment Data Location of Assessment: [facility] ED Is this a Tele or Face-to-Face Assessment?: Tele Assessment Is this an Initial Assessment or a Re-assessment for this encounter?: Initial Assessment Living Arrangements: Other (Comment);Parent ("I live with  my mother who has alheizmers") Can pt return to current living arrangement?: No Admission Status: Voluntary Is patient capable of signing voluntary admission?: Yes Transfer from: Acute Hospital Referral Source: Self/Family/Friend     [facility] Crisis Care Plan Living Arrangements: Other (Comment);Parent ("I live with my  mother who has alheizmers") Name of Psychiatrist:  (No psychiatrist ) Name of Therapist:  (No therapist )  Education Status Is patient currently in school?: No  Risk to self Suicidal Ideation: Yes-Currently Present Suicidal Intent: Yes-Currently Present Is patient at risk for suicide?: Yes Suicidal Plan?: No Access to Means: No What has been your use of drugs/alcohol within the last 12 months?:  (n/a) Previous Attempts/Gestures: No How many times?:  (0) Other Self Harm Risks:  (none reported) Triggers for Past Attempts: Other (Comment) (no previous attempts or gestures) Intentional Self Injurious Behavior: None Family Suicide History: Unknown Recent stressful life event(s): Other (Comment) (caretaker for mother w/ alheimers, 2nd DUI, etc. ) Persecutory voices/beliefs?: No Depression: Yes Depression Symptoms: Feeling angry/irritable;Feeling worthless/self pity;Loss of interest in usual pleasures;Guilt;Fatigue;Isolating;Tearfulness;Insomnia;Despondent Substance abuse history and/or treatment for substance abuse?: No Suicide prevention information given to non-admitted patients: Not applicable  Risk to Others Homicidal Ideation: No Thoughts of Harm to Others: No Current Homicidal Intent: No Current Homicidal Plan: No Access to Homicidal Means: No Identified Victim:  (n/a) History of harm to others?: No Assessment of Violence: None Noted Violent Behavior Description:  (patient is calm and cooperative ) Does patient have access to weapons?: No Criminal Charges Pending?: No Does patient have a court date: No  Psychosis: Pt reports visual hallucinations of people  wandering in her bedroom at night. Also reports people/shadows looking in her window at night.    Mental Status Report Appear/Hygiene: Disheveled Eye Contact: Fair Motor Activity: Freedom of movement Speech: Logical/coherent Level of Consciousness: Alert Mood: Depressed Affect: Appropriate to circumstance Anxiety Level: None Thought Processes: Coherent Judgement: Impaired Orientation: Person;Place;Time;Situation Obsessive Compulsive Thoughts/Behaviors: None  Cognitive Functioning Concentration: Decreased Memory: Recent Intact;Remote Intact IQ: Average Insight: Fair Impulse Control: Fair Appetite: Poor Weight Loss:  (none reported ) Weight Gain:  (none reported ) Sleep: Decreased Total Hours of Sleep:  (varies; approx.4 hours per night ) Vegetative Symptoms: None  ADLScreening [facility] Assessment Services) Patient's cognitive ability adequate to safely complete daily activities?: Yes Patient able to express need for assistance with ADLs?: Yes Independently performs ADLs?: Yes (appropriate for developmental age)  Prior Inpatient Therapy Prior Inpatient Therapy: No Prior Therapy Dates:  (n/a) Prior Therapy Facilty/Provider(s):  (n/a) Reason for Treatment:  (n/a)  Prior Outpatient Therapy Prior Outpatient Therapy: Yes Prior Therapy Dates:  (currently ) Prior Therapy Facilty/Provider(s):  [facility] cur[facility] ) Reason for Treatment:  (medication mgt)  ADL Screening (condition at time of admission) Patient's cognitive ability adequate to safely complete daily activities?: Yes Is the patient deaf or have difficulty hearing?: No Does the patient have difficulty seeing, even when wearing glasses/contacts?: No Does the patient have difficulty concentrating, remembering, or making decisions?: No Patient able to express need for assistance with ADLs?: Yes Does the patient have difficulty dressing or bathing?: No Independently performs ADLs?: Yes (appropriate for developmental  age) Communication: Independent Dressing (OT): Independent Grooming: Independent Feeding: Independent Bathing: Independent Toileting: Independent In/Out Bed: Independent Walks in Home: Independent Does the patient have difficulty walking or climbing stairs?: No Weakness of Legs: None Weakness of Arms/Hands: None  Home Assistive Devices/Equipment Home Assistive Devices/Equipment: None    Abuse/Neglect Assessment (Assessment to be complete while patient is alone) Physical Abuse: Denies Verbal Abuse: Denies Sexual Abuse: Denies Exploitation of patient/patient's resources: Denies Self-Neglect: Denies Values / Beliefs Cultural Requests During Hospitalization: None Spiritual Requests During Hospitalization: None   Advance Directives (For Healthcare) Advance Directive: Patient does not have advance directive Nutrition Screen- [facility] Adult/WL/AP Patient's home diet:  Regular  Additional Information 1:1 In Past 12 Months?: No CIRT Risk: No Elopement Risk: No Does patient have medical clearance?: No     Disposition:  Disposition Initial Assessment Completed for this Encounter: Yes Disposition of Patient: Inpatient treatment program Type of inpatient treatment program: Adult  Pt accepted to [facility] by Nanine Means, NP to bed 502-1 to Dr. Elsie Saas   On Site Evaluation by:   Reviewed with Physician:    Melynda Ripple [facility] 07/09/2013 6:53 PM

## 2013-07-09 NOTE — ED Notes (Signed)
Pt resting at this time with eyes closed.  Sitter at bedside.

## 2013-07-10 ENCOUNTER — Inpatient Hospital Stay (HOSPITAL_COMMUNITY)
Admission: AD | Admit: 2013-07-10 | Discharge: 2013-07-14 | DRG: 885 | Disposition: A | Discharge status: Home / Self Care | Payer: Federal, State, Local not specified - Other | Source: Intra-hospital | Attending: Psychiatry | Admitting: Psychiatry

## 2013-07-10 ENCOUNTER — Encounter (HOSPITAL_COMMUNITY): Payer: Self-pay | Admitting: *Deleted

## 2013-07-10 DIAGNOSIS — H5316 Psychophysical visual disturbances: Secondary | ICD-10-CM | POA: Diagnosis present

## 2013-07-10 DIAGNOSIS — R05 Cough: Secondary | ICD-10-CM

## 2013-07-10 DIAGNOSIS — J310 Chronic rhinitis: Secondary | ICD-10-CM

## 2013-07-10 DIAGNOSIS — F333 Major depressive disorder, recurrent, severe with psychotic symptoms: Principal | ICD-10-CM

## 2013-07-10 DIAGNOSIS — K219 Gastro-esophageal reflux disease without esophagitis: Secondary | ICD-10-CM | POA: Diagnosis present

## 2013-07-10 DIAGNOSIS — F411 Generalized anxiety disorder: Secondary | ICD-10-CM | POA: Diagnosis present

## 2013-07-10 DIAGNOSIS — M549 Dorsalgia, unspecified: Secondary | ICD-10-CM | POA: Diagnosis present

## 2013-07-10 DIAGNOSIS — J45909 Unspecified asthma, uncomplicated: Secondary | ICD-10-CM

## 2013-07-10 DIAGNOSIS — R45851 Suicidal ideations: Secondary | ICD-10-CM

## 2013-07-10 DIAGNOSIS — G8929 Other chronic pain: Secondary | ICD-10-CM | POA: Diagnosis present

## 2013-07-10 DIAGNOSIS — Z7982 Long term (current) use of aspirin: Secondary | ICD-10-CM

## 2013-07-10 DIAGNOSIS — E785 Hyperlipidemia, unspecified: Secondary | ICD-10-CM | POA: Diagnosis present

## 2013-07-10 DIAGNOSIS — F4310 Post-traumatic stress disorder, unspecified: Secondary | ICD-10-CM | POA: Diagnosis present

## 2013-07-10 DIAGNOSIS — L272 Dermatitis due to ingested food: Secondary | ICD-10-CM

## 2013-07-10 DIAGNOSIS — J4489 Other specified chronic obstructive pulmonary disease: Secondary | ICD-10-CM | POA: Diagnosis present

## 2013-07-10 DIAGNOSIS — Z823 Family history of stroke: Secondary | ICD-10-CM

## 2013-07-10 DIAGNOSIS — I2510 Atherosclerotic heart disease of native coronary artery without angina pectoris: Secondary | ICD-10-CM | POA: Diagnosis present

## 2013-07-10 DIAGNOSIS — Z833 Family history of diabetes mellitus: Secondary | ICD-10-CM

## 2013-07-10 DIAGNOSIS — Z8249 Family history of ischemic heart disease and other diseases of the circulatory system: Secondary | ICD-10-CM

## 2013-07-10 DIAGNOSIS — M129 Arthropathy, unspecified: Secondary | ICD-10-CM | POA: Diagnosis present

## 2013-07-10 DIAGNOSIS — Z79899 Other long term (current) drug therapy: Secondary | ICD-10-CM

## 2013-07-10 DIAGNOSIS — F32A Depression, unspecified: Secondary | ICD-10-CM | POA: Diagnosis present

## 2013-07-10 DIAGNOSIS — N750 Cyst of Bartholin's gland: Secondary | ICD-10-CM

## 2013-07-10 DIAGNOSIS — R1032 Left lower quadrant pain: Secondary | ICD-10-CM

## 2013-07-10 DIAGNOSIS — F329 Major depressive disorder, single episode, unspecified: Secondary | ICD-10-CM | POA: Diagnosis present

## 2013-07-10 DIAGNOSIS — I10 Essential (primary) hypertension: Secondary | ICD-10-CM | POA: Diagnosis present

## 2013-07-10 DIAGNOSIS — F1910 Other psychoactive substance abuse, uncomplicated: Secondary | ICD-10-CM | POA: Diagnosis present

## 2013-07-10 DIAGNOSIS — E039 Hypothyroidism, unspecified: Secondary | ICD-10-CM | POA: Diagnosis present

## 2013-07-10 DIAGNOSIS — J984 Other disorders of lung: Secondary | ICD-10-CM

## 2013-07-10 DIAGNOSIS — F17200 Nicotine dependence, unspecified, uncomplicated: Secondary | ICD-10-CM | POA: Diagnosis present

## 2013-07-10 DIAGNOSIS — J329 Chronic sinusitis, unspecified: Secondary | ICD-10-CM

## 2013-07-10 DIAGNOSIS — J449 Chronic obstructive pulmonary disease, unspecified: Secondary | ICD-10-CM

## 2013-07-10 DIAGNOSIS — Z90710 Acquired absence of both cervix and uterus: Secondary | ICD-10-CM

## 2013-07-10 DIAGNOSIS — I252 Old myocardial infarction: Secondary | ICD-10-CM

## 2013-07-10 DIAGNOSIS — Z23 Encounter for immunization: Secondary | ICD-10-CM

## 2013-07-10 DIAGNOSIS — J439 Emphysema, unspecified: Secondary | ICD-10-CM

## 2013-07-10 DIAGNOSIS — R933 Abnormal findings on diagnostic imaging of other parts of digestive tract: Secondary | ICD-10-CM

## 2013-07-10 MED ORDER — HYDROXYZINE HCL 25 MG PO TABS
25.0000 mg | ORAL_TABLET | Freq: Four times a day (QID) | ORAL | Status: AC | PRN
  Administered 2013-07-10 – 2013-07-11 (×4): 25 mg via ORAL
  Filled 2013-07-10 (×4): qty 1

## 2013-07-10 MED ORDER — INFLUENZA VAC SPLIT QUAD 0.5 ML IM SUSP
0.5000 mL | INTRAMUSCULAR | Status: AC
  Administered 2013-07-10: 0.5 mL via INTRAMUSCULAR
  Filled 2013-07-10: qty 0.5

## 2013-07-10 MED ORDER — ATORVASTATIN CALCIUM 40 MG PO TABS
40.0000 mg | ORAL_TABLET | Freq: Every day | ORAL | Status: DC
  Filled 2013-07-10 (×2): qty 1

## 2013-07-10 MED ORDER — FUROSEMIDE 20 MG PO TABS
20.0000 mg | ORAL_TABLET | Freq: Every day | ORAL | Status: DC
  Administered 2013-07-10 – 2013-07-14 (×5): 20 mg via ORAL
  Filled 2013-07-10 (×7): qty 1

## 2013-07-10 MED ORDER — MONTELUKAST SODIUM 10 MG PO TABS
10.0000 mg | ORAL_TABLET | Freq: Every day | ORAL | Status: DC
  Administered 2013-07-10 – 2013-07-13 (×4): 10 mg via ORAL
  Filled 2013-07-10 (×7): qty 1

## 2013-07-10 MED ORDER — FLUTICASONE PROPIONATE 50 MCG/ACT NA SUSP
2.0000 | Freq: Every day | NASAL | Status: DC
  Administered 2013-07-10 – 2013-07-13 (×4): 2 via NASAL
  Filled 2013-07-10: qty 16
  Filled 2013-07-10: qty 32

## 2013-07-10 MED ORDER — LOSARTAN POTASSIUM 50 MG PO TABS
50.0000 mg | ORAL_TABLET | Freq: Every day | ORAL | Status: DC
  Administered 2013-07-10 – 2013-07-14 (×5): 50 mg via ORAL
  Filled 2013-07-10 (×8): qty 1

## 2013-07-10 MED ORDER — PRASUGREL HCL 10 MG PO TABS
10.0000 mg | ORAL_TABLET | Freq: Every day | ORAL | Status: DC
  Administered 2013-07-10 – 2013-07-14 (×5): 10 mg via ORAL
  Filled 2013-07-10 (×9): qty 1

## 2013-07-10 MED ORDER — GABAPENTIN 600 MG PO TABS
600.0000 mg | ORAL_TABLET | Freq: Four times a day (QID) | ORAL | Status: DC
  Administered 2013-07-10 – 2013-07-13 (×14): 600 mg via ORAL
  Filled 2013-07-10 (×21): qty 1

## 2013-07-10 MED ORDER — CLONAZEPAM 0.5 MG PO TABS
0.5000 mg | ORAL_TABLET | Freq: Two times a day (BID) | ORAL | Status: DC | PRN
  Administered 2013-07-10 – 2013-07-13 (×5): 0.5 mg via ORAL
  Filled 2013-07-10 (×5): qty 1

## 2013-07-10 MED ORDER — ONDANSETRON 4 MG PO TBDP: 4.0000 mg | ORAL_TABLET | Freq: Four times a day (QID) | ORAL | Status: AC | PRN

## 2013-07-10 MED ORDER — ACETAMINOPHEN 325 MG PO TABS
650.0000 mg | ORAL_TABLET | Freq: Four times a day (QID) | ORAL | Status: DC | PRN
  Administered 2013-07-13: 650 mg via ORAL
  Filled 2013-07-10: qty 2

## 2013-07-10 MED ORDER — MAGNESIUM HYDROXIDE 400 MG/5ML PO SUSP: 30.0000 mL | Freq: Every day | ORAL | Status: DC | PRN

## 2013-07-10 MED ORDER — MOMETASONE FURO-FORMOTEROL FUM 100-5 MCG/ACT IN AERO
2.0000 | INHALATION_SPRAY | Freq: Two times a day (BID) | RESPIRATORY_TRACT | Status: DC
Start: 2013-07-10 — End: 2013-07-14
  Administered 2013-07-10 – 2013-07-14 (×8): 2 via RESPIRATORY_TRACT
  Filled 2013-07-10: qty 8.8

## 2013-07-10 MED ORDER — ALUM & MAG HYDROXIDE-SIMETH 200-200-20 MG/5ML PO SUSP: 30.0000 mL | ORAL | Status: DC | PRN

## 2013-07-10 MED ORDER — ESCITALOPRAM OXALATE 10 MG PO TABS
10.0000 mg | ORAL_TABLET | Freq: Every day | ORAL | Status: DC
  Administered 2013-07-10 – 2013-07-14 (×5): 10 mg via ORAL
  Filled 2013-07-10 (×2): qty 1
  Filled 2013-07-10: qty 14
  Filled 2013-07-10 (×5): qty 1

## 2013-07-10 MED ORDER — CHLORDIAZEPOXIDE HCL 25 MG PO CAPS: 25.0000 mg | ORAL_CAPSULE | Freq: Four times a day (QID) | ORAL | Status: AC | PRN

## 2013-07-10 MED ORDER — ATORVASTATIN CALCIUM 40 MG PO TABS
40.0000 mg | ORAL_TABLET | Freq: Every day | ORAL | Status: DC
  Administered 2013-07-10 – 2013-07-13 (×4): 40 mg via ORAL
  Filled 2013-07-10 (×6): qty 1

## 2013-07-10 MED ORDER — LEVOTHYROXINE SODIUM 100 MCG PO TABS
100.0000 ug | ORAL_TABLET | Freq: Every day | ORAL | Status: DC
  Administered 2013-07-11 – 2013-07-14 (×4): 100 ug via ORAL
  Filled 2013-07-10 (×7): qty 1

## 2013-07-10 MED ORDER — LABETALOL HCL 100 MG PO TABS
100.0000 mg | ORAL_TABLET | Freq: Every day | ORAL | Status: DC
  Administered 2013-07-10 – 2013-07-14 (×5): 100 mg via ORAL
  Filled 2013-07-10 (×9): qty 1

## 2013-07-10 MED ORDER — ALBUTEROL SULFATE HFA 108 (90 BASE) MCG/ACT IN AERS
2.0000 | INHALATION_SPRAY | Freq: Four times a day (QID) | RESPIRATORY_TRACT | Status: DC | PRN
  Filled 2013-07-10: qty 6.7

## 2013-07-10 MED ORDER — TRAMADOL HCL 50 MG PO TABS
50.0000 mg | ORAL_TABLET | Freq: Once | ORAL | Status: AC
  Administered 2013-07-10: 50 mg via ORAL
  Filled 2013-07-10: qty 1

## 2013-07-10 MED ORDER — PANTOPRAZOLE SODIUM 40 MG PO TBEC
80.0000 mg | DELAYED_RELEASE_TABLET | Freq: Every day | ORAL | Status: DC
  Administered 2013-07-10 – 2013-07-14 (×5): 80 mg via ORAL
  Filled 2013-07-10 (×7): qty 2

## 2013-07-10 MED ORDER — LOPERAMIDE HCL 2 MG PO CAPS: 2.0000 mg | ORAL_CAPSULE | ORAL | Status: AC | PRN

## 2013-07-10 MED ORDER — NICOTINE 21 MG/24HR TD PT24
21.0000 mg | MEDICATED_PATCH | Freq: Every day | TRANSDERMAL | Status: DC
  Administered 2013-07-10 – 2013-07-14 (×5): 21 mg via TRANSDERMAL
  Filled 2013-07-10 (×8): qty 1

## 2013-07-10 MED ORDER — ASPIRIN 325 MG PO TABS
325.0000 mg | ORAL_TABLET | Freq: Every day | ORAL | Status: DC
  Administered 2013-07-10 – 2013-07-14 (×5): 325 mg via ORAL
  Filled 2013-07-10 (×7): qty 1

## 2013-07-10 NOTE — [facility] Group Notes (Signed)
B[facility] LCSW Group Therapy Note  07/10/2013  Type of Therapy and Topic:  Group Therapy: Avoiding Self-Sabotaging and Enabling Behaviors  Participation Level:  Did not attend    Description of Group:    The main focus of today's process group was for the patient to identify ways in which they have in the past sabotaged their own recovery. Motivational Interviewing was utilized to process with group members the positive gains and negative consequences of self sabotaging behaviors and to process pt desire to change. The Stages of Change were explained using a handout, and patients identified where they currently are with regard to stages of change.   Therapeutic Goals: 1. Patient will identify one obstacle that relates to self-sabotage and enabling behaviors 2. Patient will identify one personal self-sabotaging or enabling behavior they did prior to admission 3. Patient able to establish a plan to change the above identified behavior they did prior to admission:  4. Patient will demonstrate ability to communicate their needs through discussion and/or role plays.       Therapeutic Modalities:   Cognitive Behavioral Therapy Person-Centered Therapy Motivational Interviewing  

## 2013-07-10 NOTE — Progress Notes (Signed)
D:  Pt. Is sleeping but easily aroused.  Pt. Has a depressed mood and flat affect.  She did not forward much information this morning. She still endorses passive SI, and remains contracted for safety.    A: Patient given emotional support from RN. Patient encouraged to come to staff with concerns and/or questions. Patient's medication routine continued. Patient's orders and plan of care reviewed.   R: Patient remains appropriate and cooperative. Will continue to monitor patient q15 minutes for safety.  Pt. Encouraged to get up and move about the milieu, she did eat her breakfast that was brought back to the unit for her.
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
## 2013-07-10 NOTE — ED Provider Notes (Signed)
Medical screening examination/treatment/procedure(s) were performed by non-physician practitioner and as supervising physician I was immediately available for consultation/collaboration.  EKG Interpretation   None        Flint Melter, MD 07/10/13 1426

## 2013-07-10 NOTE — Progress Notes (Signed)
Pt. Did not attend am group.

## 2013-07-10 NOTE — Tx Team (Signed)
Initial Interdisciplinary Treatment Plan  PATIENT STRENGTHS: (choose at least two) Ability for insight Average or above average intelligence Capable of independent living Motivation for treatment/growth  PATIENT STRESSORS: Financial difficulties Medication change or noncompliance   PROBLEM LIST: Problem List/Patient Goals Date to be addressed Date deferred Reason deferred Estimated date of resolution  Depression 07/10/13     Suicidal Ideation 07/10/13                                                DISCHARGE CRITERIA:  Ability to meet basic life and health needs Improved stabilization in mood, thinking, and/or behavior Verbal commitment to aftercare and medication compliance  PRELIMINARY DISCHARGE PLAN: Attend aftercare/continuing care group Return to previous living arrangement  PATIENT/FAMIILY INVOLVEMENT: This treatment plan has been presented to and reviewed with the patient, [name] [name], and/or family member, .  The patient and family have been given the opportunity to ask questions and make suggestions.  Zayyan Mullen, [place] 07/10/2013, 1:49 AM

## 2013-07-10 NOTE — Progress Notes (Signed)
Adult Psychoeducational Group Note  Date:  07/10/2013 Time:  9:19 PM  Group Topic/Focus:  Wrap-Up Group:   The focus of this group is to help patients review their daily goal of treatment and discuss progress on daily workbooks.  Participation Level:  Minimal  Participation Quality:  Appropriate and Sharing  Affect:  Anxious, Depressed and Tearful  Cognitive:  Oriented  Insight: Lacking  Engagement in Group:  Engaged  Modes of Intervention:  Education  Additional Comments:  When Clinical research associate asked how pt was doing pt responded " I am here, I am just here. " Pt stated she was scared but did not identify any particular thing that scared her, although she expressed slight paranoia stating she thought someone was after her and she was nervous. Pt became tearful and trembled slightly when speaking.  Pt was asked would coping skill could she use and pt responded she could pray.  Pt asked for a bible and was given one by Clinical research associate after group.    Stephan Minister [facility] 07/10/2013, 9:19 PM
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
## 2013-07-10 NOTE — Progress Notes (Signed)
57 year old female admitted on voluntary basis. Pt reports that she brought herself into the ED because she needed help. Pt reports that she has been off her medications for a few weeks due to her running out and missing an appointment at [facility]. Pt, in the past couple weeks, began to use alcohol and cocaine. Pt stated that she is tired and that she has had enough and can't take it any more. Pt is able to contract for safety on the unit. Pt states that she would like to get back on her medications while here and feels it was beneficial in helping her cope with her various stressors. Pt states that she lives with her mother who has alzheimers and has financial difficulties and that this has been weighing on her. Pt was oriented to the unit and safety maintained.

## 2013-07-10 NOTE — ED Notes (Signed)
Called [name] [name] to report that she is going to be transferred.  All belongings taken home by [name] [name].

## 2013-07-10 NOTE — [facility] Suicide Risk Assessment (Signed)
Suicide Risk Assessment  Admission Assessment     Nursing information obtained from:    Demographic factors:    Current Mental Status:    Loss Factors:    Historical Factors:    Risk Reduction Factors:     CLINICAL FACTORS:   Depression:   Anhedonia Hopelessness Impulsivity Severe Alcohol/Substance Abuse/Dependencies Currently Psychotic Previous Psychiatric Diagnoses and Treatments  COGNITIVE FEATURES THAT CONTRIBUTE TO RISK:  Closed-mindedness Loss of executive function Polarized thinking Thought constriction (tunnel vision)    SUICIDE RISK:   Moderate:  Frequent suicidal ideation with limited intensity, and duration, some specificity in terms of plans, no associated intent, good self-control, limited dysphoria/symptomatology, some risk factors present, and identifiable protective factors, including available and accessible social support.  PLAN OF CARE: Please see admission note.  I certify that inpatient services furnished can reasonably be expected to improve the patient's condition.  Rowena Moilanen T. 07/10/2013, 3:31 PM

## 2013-07-11 DIAGNOSIS — F333 Major depressive disorder, recurrent, severe with psychotic symptoms: Secondary | ICD-10-CM

## 2013-07-11 MED ORDER — RISPERIDONE 0.5 MG PO TABS
0.5000 mg | ORAL_TABLET | Freq: Two times a day (BID) | ORAL | Status: DC
  Administered 2013-07-11 – 2013-07-14 (×6): 0.5 mg via ORAL
  Filled 2013-07-11 (×5): qty 1
  Filled 2013-07-11: qty 28
  Filled 2013-07-11: qty 1
  Filled 2013-07-11: qty 28
  Filled 2013-07-11 (×2): qty 1

## 2013-07-11 NOTE — Progress Notes (Signed)
Writer spoke with patient at medication window where she received her scheduled 2000 meds. She reports still feeling anxious and writer suggested that she use distraction as a coping skill sine it was too soon to be medicated again. Patient seems focused on her stressors and writer encouraged her to attend wrap up group instead of isolating in her room. Patient was receptive to advice and made preparation to attedd group. Patient has sad, flat affect and denies si/hi/a/v. Support and encouragement offered, safety maintained on unit with 15 min checks.

## 2013-07-11 NOTE — Progress Notes (Signed)
The focus of this group is to educate the patient on the purpose and policies of crisis stabilization and provide a format to answer questions about their admission.  The group details unit policies and expectations of patients while admitted.  Patient did not attend 0900 nurse education orientation group this morning.  Patient stayed in bed sleeping.    

## 2013-07-11 NOTE — Progress Notes (Signed)
Adult Psychoeducational Group Note  Date:  07/11/2013 Time:  10:09 PM  Group Topic/Focus:  Wrap-Up Group:   The focus of this group is to help patients review their daily goal of treatment and discuss progress on daily workbooks.  Participation Level:  Active  Participation Quality:  Appropriate and Attentive  Affect:  Appropriate  Cognitive:  Appropriate  Insight: Improving  Engagement in Group:  Engaged  Modes of Intervention:  Education  Additional Comments:  Pt appeared to be doing much better than yesterday. Pt reported not being as scared as she was yesterday during wrap up group. Pt reported although she slept some of the day, she did attend all groups.  Pt stated she has been able to get comfortable and open up to some peers on the unit.Pt stated keeping a diary as a coping skills, and plans to start making list of things she needs to do b/c she has a tendency to forget things.  Pt stated her healthy support comes from God.   Stephan Minister [facility] 07/11/2013, 10:09 PM

## 2013-07-11 NOTE — Progress Notes (Signed)
Adult Psychoeducational Group Note  Date:  07/11/2013 Time:  1015  Group Topic/Focus:  Healthy Communication:   The focus of this group is to discuss communication, barriers to communication, as well as healthy ways to communicate with others.  Participation Level:  Active  Participation Quality:  Appropriate, Attentive, Sharing and Supportive  Affect:  Appropriate  Cognitive:  Alert and Appropriate  Insight: Appropriate and Good  Engagement in Group:  Supportive  Modes of Intervention:  Education  Additional Comments:  ATTENTIVE, Army Chaco 07/11/2013, 1:41 PM

## 2013-07-11 NOTE — Progress Notes (Signed)
Adult Psychoeducational Group Note  Date:  07/11/2013 Time:  1:15PM  Group Topic/Focus:  Therapeutic Activity  Participation Level:  Did Not Attend  Additional Comments:  Pt did not attend group. Pt said that she was feeling tired and stayed in the bed in her room  Jeneen Doutt K 07/11/2013, 2:00 PM

## 2013-07-11 NOTE — Progress Notes (Signed)
[facility] MD Progress Note  07/11/2013 10:59 AM [name] [name]  MRN:  [id number] Subjective:   Patient is up and dressed with her make-up on this morning for assessment.  She reports continued feeling "that people are coming after me" and hears voices telling her "I'm going to get you".  This creates fear and nervousness.  She reports feeling scared and helpless.  She was happy that her mother and daughter came to visit last night and that she is praying and reading  bible for support. Diagnosis:   DSM5:  Trauma-Stressor Disorders:  Posttraumatic Stress Disorder (309.81) Substance/Addictive Disorders:Substance abuse Depressive Disorders:  Major depressive Disorder, with Psychotic features  Axis I: Major Depression, Recurrent severe, Post Traumatic Stress Disorder and Substance Abuse Axis II: Deferred Axis III:  Past Medical History  Diagnosis Date  . Thyroid disease   . Coronary artery disease   . Hernia   . Chronic back pain     herniated nucleus pulposus  . Hyperlipidemia     takes Lipitor daily  . Hypertension     takes Losartan daily and Labetalol bid  . Heart attack 2011  . Asthma   . COPD (chronic obstructive pulmonary disease)     early stages  . Shortness of breath     with exertion  . Bronchitis     couple of months ago  . Arthritis   . Joint pain   . Neck pain   . Bruises easily     pt is on Effient  . Psoriasis     elbows,knees,back  . H/O hiatal hernia   . GERD (gastroesophageal reflux disease)     takes Nexium daily  . Hemorrhoids   . Diverticulosis   . Slowing of urinary stream   . Hypothyroidism     takes Synthroid daily  . Depression     takes Klonopin daily  . Anxiety     takes Lexapro daily  . Insomnia     hydroxyzine prn  . Bartholin gland cyst 08/29/2011   Axis IV: economic problems, other psychosocial or environmental problems, problems related to legal system/crime and problems with primary support group Axis V: 21-30 behavior considerably  influenced by delusions or hallucinations OR serious impairment in judgment, communication OR inability to function in almost all areas  ADL's:  Intact  Sleep: Fair  Appetite:  Good  Suicidal Ideation:  She is having fleeting thoughts that if "someone is going to get me, I'll take my life first".  No plan Homicidal Ideation:  -denies at this time AEB (as evidenced by):  Psychiatric Specialty Exam: Review of Systems  Constitutional: Negative.   HENT: Negative.   Eyes: Negative.   Respiratory: Negative.   Cardiovascular: Negative.   Gastrointestinal: Negative.   Genitourinary: Negative.   Musculoskeletal: Negative.   Skin: Negative.   Neurological: Negative.   Endo/Heme/Allergies: Negative.   Psychiatric/Behavioral: Positive for depression, suicidal ideas and hallucinations.    Blood pressure 114/74, pulse 79, temperature 97.4 F (36.3 C), temperature source Oral, resp. rate 20, height 5\' 1"  (1.549 m), weight 58.514 kg (129 lb).Body mass index is 24.39 kg/(m^2).  General Appearance: Well Groomed  Patent attorney::  Good  Speech:  Normal Rate  Volume:  Normal  Mood:  Anxious and Depressed  Affect:  Flat  Thought Process:  Coherent  Orientation:  Full (Time, Place, and Person)  Thought Content:  Hallucinations: Auditory Visual and Paranoid Ideation  Suicidal Thoughts:  Yes.  without intent/plan  Homicidal Thoughts:  No  Memory:  intact  Judgement:  Impaired  Insight:  Lacking  Psychomotor Activity:  Decreased  Concentration:  Fair  Recall:  Fair  Akathisia:  No  Handed:  Right  AIMS (if indicated):     Assets:  Resilience Social Support  Sleep:  Number of Hours: 5.75   Current Medications: Current Facility-Administered Medications  Medication Dose Route Frequency Provider Last Rate Last Dose  . acetaminophen (TYLENOL) tablet 650 mg  650 mg Oral Q6H PRN Nanine Means, NP      . albuterol (PROVENTIL HFA;VENTOLIN HFA) 108 (90 BASE) MCG/ACT inhaler 2 puff  2 puff  Inhalation Q6H PRN Nanine Means, NP      . alum & mag hydroxide-simeth (MAALOX/MYLANTA) 200-200-20 MG/5ML suspension 30 mL  30 mL Oral Q4H PRN Nanine Means, NP      . aspirin tablet 325 mg  325 mg Oral Daily Nanine Means, NP   325 mg at 07/11/13 0755  . atorvastatin (LIPITOR) tablet 40 mg  40 mg Oral q1800 Canary Brim, NP   40 mg at 07/10/13 1906  . chlordiazePOXIDE (LIBRIUM) capsule 25 mg  25 mg Oral Q6H PRN Nanine Means, NP      . clonazePAM Scarlette Calico) tablet 0.5 mg  0.5 mg Oral BID PRN Nanine Means, NP   0.5 mg at 07/10/13 1907  . escitalopram (LEXAPRO) tablet 10 mg  10 mg Oral Daily Nanine Means, NP   10 mg at 07/11/13 0755  . fluticasone (FLONASE) 50 MCG/ACT nasal spray 2 spray  2 spray Each Nare QHS Nanine Means, NP   2 spray at 07/10/13 2139  . furosemide (LASIX) tablet 20 mg  20 mg Oral Daily Nanine Means, NP   20 mg at 07/11/13 0755  . gabapentin (NEURONTIN) tablet 600 mg  600 mg Oral QID Nanine Means, NP   600 mg at 07/11/13 0755  . hydrOXYzine (ATARAX/VISTARIL) tablet 25 mg  25 mg Oral Q6H PRN Nanine Means, NP   25 mg at 07/10/13 2142  . labetalol (NORMODYNE) tablet 100 mg  100 mg Oral Daily Canary Brim, NP   100 mg at 07/11/13 0755  . levothyroxine (SYNTHROID, LEVOTHROID) tablet 100 mcg  100 mcg Oral QAC breakfast Canary Brim, NP   100 mcg at 07/11/13 1610  . loperamide (IMODIUM) capsule 2-4 mg  2-4 mg Oral PRN Nanine Means, NP      . losartan (COZAAR) tablet 50 mg  50 mg Oral Daily Canary Brim, NP   50 mg at 07/11/13 0755  . magnesium hydroxide (MILK OF MAGNESIA) suspension 30 mL  30 mL Oral Daily PRN Nanine Means, NP      . mometasone-formoterol (DULERA) 100-5 MCG/ACT inhaler 2 puff  2 puff Inhalation BID Nanine Means, NP   2 puff at 07/11/13 0756  . montelukast (SINGULAIR) tablet 10 mg  10 mg Oral QHS Canary Brim, NP   10 mg at 07/10/13 2139  . nicotine (NICODERM CQ - dosed in mg/24 hours) patch 21 mg  21 mg Transdermal Q0600 Nanine Means, NP   21 mg at 07/11/13 0756  .  ondansetron (ZOFRAN-ODT) disintegrating tablet 4 mg  4 mg Oral Q6H PRN Nanine Means, NP      . pantoprazole (PROTONIX) EC tablet 80 mg  80 mg Oral Q1200 Nanine Means, NP   80 mg at 07/10/13 1248  . prasugrel (EFFIENT) tablet 10 mg  10 mg Oral Daily Canary Brim, NP   10 mg at 07/11/13 [phone]  Lab Results:  Results for orders placed during the hospital encounter of 07/09/13 (from the past 48 hour(s))  CBC     Status: None   Collection Time    07/09/13  2:55 PM      Result Value Range   WBC 7.5  4.0 - 10.5 K/uL   RBC 4.50  3.87 - 5.11 MIL/uL   Hemoglobin 15.0  12.0 - 15.0 g/dL   HCT 16.1  09.6 - 04.5 %   MCV 98.2  78.0 - 100.0 fL   MCH 33.3  26.0 - 34.0 pg   MCHC 33.9  30.0 - 36.0 g/dL   RDW 40.9  81.1 - 91.4 %   Platelets 338  150 - 400 K/uL  BASIC METABOLIC PANEL     Status: Abnormal   Collection Time    07/09/13  2:55 PM      Result Value Range   Sodium 140  135 - 145 mEq/L   Potassium 4.3  3.5 - 5.1 mEq/L   Chloride 102  96 - 112 mEq/L   CO2 29  19 - 32 mEq/L   Glucose, Bld 96  70 - 99 mg/dL   BUN 9  6 - 23 mg/dL   Creatinine, Ser 7.82  0.50 - 1.10 mg/dL   Calcium 9.4  8.4 - 95.6 mg/dL   GFR calc non Af Amer 74 (*) >90 mL/min   GFR calc Af Amer 85 (*) >90 mL/min   Comment: (NOTE)     The eGFR has been calculated using the CKD EPI equation.     This calculation has not been validated in all clinical situations.     eGFR's persistently <90 mL/min signify possible Chronic Kidney     Disease.  ACETAMINOPHEN LEVEL     Status: None   Collection Time    07/09/13  2:55 PM      Result Value Range   Acetaminophen (Tylenol), Serum <15.0  10 - 30 ug/mL   Comment:            THERAPEUTIC CONCENTRATIONS VARY     SIGNIFICANTLY. A RANGE OF 10-30     ug/mL MAY BE AN EFFECTIVE     CONCENTRATION FOR MANY PATIENTS.     HOWEVER, SOME ARE BEST TREATED     AT CONCENTRATIONS OUTSIDE THIS     RANGE.     ACETAMINOPHEN CONCENTRATIONS     >150 ug/mL AT 4 HOURS AFTER     INGESTION AND >50  ug/mL AT 12     HOURS AFTER INGESTION ARE     OFTEN ASSOCIATED WITH TOXIC     REACTIONS.  ETHANOL     Status: None   Collection Time    07/09/13  2:55 PM      Result Value Range   Alcohol, Ethyl (B) <11  0 - 11 mg/dL   Comment:            LOWEST DETECTABLE LIMIT FOR     SERUM ALCOHOL IS 11 mg/dL     FOR MEDICAL PURPOSES ONLY  URINALYSIS, ROUTINE W REFLEX MICROSCOPIC     Status: None   Collection Time    07/09/13  3:59 PM      Result Value Range   Color, Urine YELLOW  YELLOW   APPearance CLEAR  CLEAR   Specific Gravity, Urine 1.023  1.005 - 1.030   pH 7.0  5.0 - 8.0   Glucose, UA NEGATIVE  NEGATIVE mg/dL   Hgb urine dipstick NEGATIVE  NEGATIVE   Bilirubin Urine NEGATIVE  NEGATIVE   Ketones, ur NEGATIVE  NEGATIVE mg/dL   Protein, ur NEGATIVE  NEGATIVE mg/dL   Urobilinogen, UA 1.0  0.0 - 1.0 mg/dL   Nitrite NEGATIVE  NEGATIVE   Leukocytes, UA NEGATIVE  NEGATIVE   Comment: MICROSCOPIC NOT DONE ON URINES WITH NEGATIVE PROTEIN, BLOOD, LEUKOCYTES, NITRITE, OR GLUCOSE <1000 mg/dL.  URINE RAPID DRUG SCREEN (HOSP PERFORMED)     Status: Abnormal   Collection Time    07/09/13  3:59 PM      Result Value Range   Opiates NONE DETECTED  NONE DETECTED   Cocaine POSITIVE (*) NONE DETECTED   Benzodiazepines NONE DETECTED  NONE DETECTED   Amphetamines NONE DETECTED  NONE DETECTED   Tetrahydrocannabinol NONE DETECTED  NONE DETECTED   Barbiturates NONE DETECTED  NONE DETECTED   Comment:            DRUG SCREEN FOR MEDICAL PURPOSES     ONLY.  IF CONFIRMATION IS NEEDED     FOR ANY PURPOSE, NOTIFY LAB     WITHIN 5 DAYS.                LOWEST DETECTABLE LIMITS     FOR URINE DRUG SCREEN     Drug Class       Cutoff (ng/mL)     Amphetamine      1000     Barbiturate      200     Benzodiazepine   200     Tricyclics       300     Opiates          300     Cocaine          300     THC              50    Physical Findings: AIMS: Facial and Oral Movements Muscles of Facial Expression: None,  normal Lips and Perioral Area: None, normal Jaw: None, normal Tongue: None, normal,Extremity Movements Upper (arms, wrists, hands, fingers): None, normal Lower (legs, knees, ankles, toes): None, normal, Trunk Movements Neck, shoulders, hips: None, normal, Overall Severity Severity of abnormal movements (highest score from questions above): None, normal Incapacitation due to abnormal movements: None, normal Patient's awareness of abnormal movements (rate only patient's report): No Awareness, Dental Status Current problems with teeth and/or dentures?: No Does patient usually wear dentures?: No  CIWA:    COWS:     Treatment Plan Summary:   Review of chart, vital signs, medications and notes Daily contact with the patient to assess and evaluate synmptoms and progress in treatment  1. Continue crisis management and stabilization. Estimated length of stay 5-7 days past his current stay of 2 2.  Medication management to reduce current symptoms to base line and improve patient's overall level of functioning     -add Risperdal 0.5 mg PO bid for auditory/visual hallucinations      Medications reviewed with the pateint and no untoward effects      Individual and group therapy encouraged      Coping skills for depression, substance abuse, and anxiety 3.  Treat health problems as indicated. 4.  Develop treatment plan to decrease risk of relapse upon discharge and the need for readmission 5.  Psych-social education regarding relapse prevention and self care. 6.  Health care follow up as needed for medical problems 7.  Continue home medications where appropriate 8.  Disposition in progress  Medical  Decision Making Problem Points:  Established problem, stable/improving (1) Data Points:  Review or order medicine tests (1) Review of medication regiment & side effects (2) Review of new medications or change in dosage (2)  I certify that inpatient services furnished can reasonably be expected to  improve the patient's condition.   Lorinda Creed  PMHNP 07/11/2013, 10:59 AM I agreed with the findings, treatment and disposition plan of this patient. Kathryne Sharper, MD

## 2013-07-11 NOTE — [facility] Group Notes (Signed)
  [facility] LCSW Group Therapy Note  07/11/2013 2:15-3:00  Type of Therapy and Topic:  Group Therapy: Feelings Around D/C & Establishing a Supportive Framework  Participation Level:  Minimal   Mood:   Appropriate  Description of Group:   What is a supportive framework? What does it look like feel like and how do I discern it from and unhealthy non-supportive network? Learn how to cope when supports are not helpful and don't support you. Discuss what to do when your family/friends are not supportive.The main focus of today's process group was to identify the patient's current support system and decide on other supports that can be put in place. An emphasis was placed on using counselor, doctor, therapy groups, 12-step groups, and problem-specific support groups to expand supports. There was also an extensive discussion about what constitutes a healthy support versus an unhealthy support.  Therapeutic Goals Addressed in Processing Group: 1. Patient will identify one healthy supportive network that they can use at discharge. 2. Patient will identify one factor of a supportive framework and how to tell it from an unhealthy network. 3. Patient able to identify one coping skill to use when they do not have positive supports from others. 4. Patient will demonstrate ability to communicate their needs through discussion and/or role plays.   Summary of Patient Progress:  [name] [name] was observed with bright and engaged mood.  She processed with group that one way she was ineffectively usig positive supports was not being honest with her MD.  Pt shared that she avoiding confronting her medication noncompliance led to a relapse into depression.  Pt reports that she plans to be more honest with herself in the future and address this issue.      [name] [name], LCSWA

## 2013-07-11 NOTE — Progress Notes (Signed)
D:  Patient's self inventory sheet, patient has fair sleep, needs sleep medication, improving appetite, low energy level, poor attention span.  Rated depression 9, hopeless 10.  Has experienced chilling, agitation in past 24 hours.  SI off/on, contracts for safety.  Has experienced lightheadedness, pain, headache in past 24 hours.  Worst pain #9.  No discharge plans.  Working on Pharmacologist for discharge.  No problems taking meds after discharge. A:  Medications administered per MD orders.  Emotional support and encouragement given patient. R:  Will continue to monitor patient for safety with 15 minute checks.  Safety maintained.

## 2013-07-11 NOTE — Progress Notes (Signed)
Writer spoke with patient 1:1 and she was observed lying down resting. Patient reports that she was dreaming of using and appeared anxious when she came to the medication window.. Patient requested a prn of klonopin which she received. Patient reports that she has attended groups today and has slept a lot today also. Patient reports auditory and visual hallucinations, writer informed her of the medication she was started on to help with her hallucinations. Patient denies si and hi. Encouraged patient to continue attending groups, patient was receptive. Safety maintained on unit with 15 min checks.

## 2013-07-12 NOTE — [facility] Group Notes (Signed)
[facility] LCSW Aftercare Discharge Planning Group Note   07/12/2013 8:45 AM  Participation Quality:  Alert, Appropriate and Oriented  Mood/Affect:  Dysthymic, Flat  Depression Rating:  5  Anxiety Rating:  5  Thoughts of Suicide:  Pt denies SI/HI  Will you contract for safety?   Yes  Current AVH:  Pt denies  Plan for Discharge/Comments:  Pt attended discharge planning group and actively participated in group.  CSW provided pt with today's workbook.  Patient is currently linked with [facility].   Transportation Means: Pt reports access to transportation  Supports: No supports mentioned at this time  Loleta Books, Theresia Majors 07/12/2013 8:34 AM

## 2013-07-12 NOTE — Progress Notes (Signed)
Adult Psychoeducational Group Note  Date:  07/12/2013 Time:  10:15 PM  Group Topic/Focus:  Wrap-Up Group:   The focus of this group is to help patients review their daily goal of treatment and discuss progress on daily workbooks.  Participation Level:  Active  Participation Quality:  Appropriate, Attentive and Sharing  Affect:  Appropriate  Cognitive:  Alert and Appropriate  Insight: Appropriate  Engagement in Group:  Engaged  Modes of Intervention:  Discussion, Education and Support  Additional Comments:  Pt rated her day at a 7. Pt said she felt like she had "stepped out of her shell today." Pt's goal is to work on taking care of herself so she will be able to take care of others, and not overwhelming herself. Pt said she received numbers for people in her support system and is planning on continuing therapy.  Malachy Moan 07/12/2013, 10:15 PM

## 2013-07-12 NOTE — [facility] Group Notes (Signed)
[facility] LCSW Group Therapy  07/12/2013 3:14 PM  Type of Therapy:  Group Therapy  Participation Level:  Active  Participation Quality:  Appropriate, Attentive, Sharing and Supportive  Affect:  Appropriate and Depressed  Cognitive:  Alert, Appropriate and Oriented  Insight:  Developing/Improving  Engagement in Therapy:  Developing/Improving  Modes of Intervention:  Activity, Discussion, Exploration, Problem-solving, Socialization and Support  Summary of Progress/Problems: Overcoming obstacles: Pt identified obstacles faced currently and processed barriers involved in overcoming these obstacles. Pt identified steps necessary for overcoming these obstacles and explored motivation (internal and external) for facing these difficulties head on. Pt further identified one area of concern in their lives and chose a goal to focus on for today.    Patient appeared engaged throughout group and increased participation as group progressed.  Patient acknowledged how she can be her own obstacle to achieving mental health wellness as she put other people's needs in front of her own. She demonstrated insight on role of stress on her mental health AEB patient discussing at length role of caring for her mother and recent loss of her job on her mental health.  Patient appeared motivated to increase self-care time upon discharge, but continues to express anxiety related to potentially not having any job upon discharge. Patient expressed intention of not projecting into the future and only focusing on her day to day.   Pervis Hocking 07/12/2013, 3:14 PM

## 2013-07-12 NOTE — Tx Team (Signed)
Interdisciplinary Treatment Plan Update (Adult)  Date: 07/12/2013  Time Reviewed:  9:45 AM  Progress in Treatment: Attending groups: Yes Participating in groups:  Yes Taking medication as prescribed:  Yes Tolerating medication:  Yes Family/Significant othe contact made: CSW assessing  Patient understands diagnosis:  Yes Discussing patient identified problems/goals with staff:  Yes Medical problems stabilized or resolved:  Yes Denies suicidal/homicidal ideation: Yes Issues/concerns per patient self-inventory:  Yes Other:  New problem(s) identified: N/A  Discharge Plan or Barriers: CSW assessing for appropriate referrals.  Reason for Continuation of Hospitalization: Anxiety Depression Medication Stabilization  Comments: N/A  Estimated length of stay: 3-5 days  For review of initial/current patient goals, please see plan of care.  Attendees: Patient:     Family:     Physician:  Dr. Johnalagadda 07/12/2013 10:00 AM   Nursing:   Beverly Knight, RN 07/12/2013 10:00 AM   Clinical Social Worker:  Lexany Belknap Horton, LCSW 07/12/2013 10:00 AM   Other: Christa Dopson, RN 07/12/2013 10:00 AM   Other:  Roxanne Brand, care coordination 07/12/2013 10:00 AM   Other:  Quylle Hodnett, LCSW 07/12/2013 10:00 AM   Other:  Carol Davis, RN 07/12/2013 10:01 AM   Other:    Other:    Other:    Other:    Other:    Other:     Scribe for Treatment Team:   Horton, Avid Guillette Nicole, 07/12/2013 10:00 AM    

## 2013-07-12 NOTE — Progress Notes (Signed)
Patient ID: [name] [name], female   DOB: 01-13-1956, 57 y.o.   MRN: [id number]  D: Pt. Denies SI/HI and A/V Hallucinations this morning verbally however patient recorded on her self inventory sheet that she has had on and off thoughts of SI. Patient was able to verbally contract for safety.  Patient reports pain in her lower and upper back that pt states is due to bulging discs. Patient received scheduled Neurontin and heat packs for back. Patient rates her depression and hopelessness at 5/10 today. Patient reports that she slept well last night but her energy level is low.  A: Support and encouragement provided to the patient. Scheduled medications given to patient per physician's orders.   R: Patient is receptive and cooperative with Clinical research associate. Patient is minimal and forwards little but is pleasant. Q15 minute checks are maintained for safety.

## 2013-07-12 NOTE — Progress Notes (Signed)
Adult Psychoeducational Group Note  Date:  07/12/2013 Time:  11:00am Group Topic/Focus:  Wellness Toolbox:   The focus of this group is to discuss various aspects of wellness, balancing those aspects and exploring ways to increase the ability to experience wellness.  Patients will create a wellness toolbox for use upon discharge.  Participation Level:  Active  Participation Quality:  Appropriate and Attentive  Affect:  Appropriate  Cognitive:  Alert and Appropriate  Insight: Appropriate  Engagement in Group:  Engaged  Modes of Intervention:  Discussion and Education  Additional Comments: Pt attended and participated in group. Discussion was on wellness. Pt stated wellness means wholeness in every area.  Shelly Bombard D 07/12/2013, 4:13 PM

## 2013-07-12 NOTE — Progress Notes (Signed)
Patient ID: [name] [name], female   DOB: [birth year], 57 y.o.   MRN: [id number] [facility] MD Progress Note  07/12/2013 1:11 PM [name] [name]  MRN:  [id number]  Subjective:   Patient has no complaints today and stated she has depression 7/10 and decreased anxiety and denies current suicidal or homicidal ideation. Patient was able to participate in unit activities without difficulties. Reportedly she is able to get along with the staff and peers on the uni. She reports continued feeling "that people are coming after me" and hears voices telling her "I'm going to get you".  She reports feeling scared and helpless. Patient contracts for safety in the hospital.  Diagnosis:   DSM5:  Trauma-Stressor Disorders:  Posttraumatic Stress Disorder (309.81) Substance/Addictive Disorders:Substance abuse Depressive Disorders:  Major depressive Disorder, with Psychotic features  Axis I: Major Depression, Recurrent severe, Post Traumatic Stress Disorder and Substance Abuse Axis II: Deferred Axis III:  Past Medical History  Diagnosis Date  . Thyroid disease   . Coronary artery disease   . Hernia   . Chronic back pain     herniated nucleus pulposus  . Hyperlipidemia     takes Lipitor daily  . Hypertension     takes Losartan daily and Labetalol bid  . Heart attack 2011  . Asthma   . COPD (chronic obstructive pulmonary disease)     early stages  . Shortness of breath     with exertion  . Bronchitis     couple of months ago  . Arthritis   . Joint pain   . Neck pain   . Bruises easily     pt is on Effient  . Psoriasis     elbows,knees,back  . H/O hiatal hernia   . GERD (gastroesophageal reflux disease)     takes Nexium daily  . Hemorrhoids   . Diverticulosis   . Slowing of urinary stream   . Hypothyroidism     takes Synthroid daily  . Depression     takes Klonopin daily  . Anxiety     takes Lexapro daily  . Insomnia     hydroxyzine prn  . Bartholin gland cyst 08/29/2011   Axis IV: economic  problems, other psychosocial or environmental problems, problems related to legal system/crime and problems with primary support group Axis V: 21-30 behavior considerably influenced by delusions or hallucinations OR serious impairment in judgment, communication OR inability to function in almost all areas  ADL's:  Intact  Sleep: Fair  Appetite:  Good  Suicidal Ideation:  She is having fleeting thoughts that if "someone is going to get me, I'll take my life first".  No plan Homicidal Ideation:  -denies at this time AEB (as evidenced by):  Psychiatric Specialty Exam: Review of Systems  Constitutional: Negative.   HENT: Negative.   Eyes: Negative.   Respiratory: Negative.   Cardiovascular: Negative.   Gastrointestinal: Negative.   Genitourinary: Negative.   Musculoskeletal: Negative.   Skin: Negative.   Neurological: Negative.   Endo/Heme/Allergies: Negative.   Psychiatric/Behavioral: Positive for depression, suicidal ideas and hallucinations.    Blood pressure 114/76, pulse 91, temperature 98.4 F (36.9 C), temperature source Oral, resp. rate 16, height 5\' 1"  (1.549 m), weight 58.514 kg (129 lb).Body mass index is 24.39 kg/(m^2).  General Appearance: Well Groomed  Patent attorney::  Good  Speech:  Normal Rate  Volume:  Normal  Mood:  Anxious and Depressed  Affect:  Flat  Thought Process:  Coherent  Orientation:  Full (Time,  Place, and Person)  Thought Content:  Hallucinations: Auditory Visual and Paranoid Ideation  Suicidal Thoughts:  Yes.  without intent/plan  Homicidal Thoughts:  No  Memory:  intact  Judgement:  Impaired  Insight:  Lacking  Psychomotor Activity:  Decreased  Concentration:  Fair  Recall:  Fair  Akathisia:  No  Handed:  Right  AIMS (if indicated):     Assets:  Resilience Social Support  Sleep:  Number of Hours: 6.75   Current Medications: Current Facility-Administered Medications  Medication Dose Route Frequency Provider Last Rate Last Dose  .  acetaminophen (TYLENOL) tablet 650 mg  650 mg Oral Q6H PRN Nanine Means, NP      . albuterol (PROVENTIL HFA;VENTOLIN HFA) 108 (90 BASE) MCG/ACT inhaler 2 puff  2 puff Inhalation Q6H PRN Nanine Means, NP      . alum & mag hydroxide-simeth (MAALOX/MYLANTA) 200-200-20 MG/5ML suspension 30 mL  30 mL Oral Q4H PRN Nanine Means, NP      . aspirin tablet 325 mg  325 mg Oral Daily Nanine Means, NP   325 mg at 07/12/13 0902  . atorvastatin (LIPITOR) tablet 40 mg  40 mg Oral q1800 Canary Brim, NP   40 mg at 07/11/13 1709  . chlordiazePOXIDE (LIBRIUM) capsule 25 mg  25 mg Oral Q6H PRN Nanine Means, NP      . clonazePAM Scarlette Calico) tablet 0.5 mg  0.5 mg Oral BID PRN Nanine Means, NP   0.5 mg at 07/11/13 2041  . escitalopram (LEXAPRO) tablet 10 mg  10 mg Oral Daily Nanine Means, NP   10 mg at 07/12/13 0903  . fluticasone (FLONASE) 50 MCG/ACT nasal spray 2 spray  2 spray Each Nare QHS Nanine Means, NP   2 spray at 07/11/13 2039  . furosemide (LASIX) tablet 20 mg  20 mg Oral Daily Nanine Means, NP   20 mg at 07/12/13 0903  . gabapentin (NEURONTIN) tablet 600 mg  600 mg Oral QID Nanine Means, NP   600 mg at 07/12/13 1145  . hydrOXYzine (ATARAX/VISTARIL) tablet 25 mg  25 mg Oral Q6H PRN Nanine Means, NP   25 mg at 07/11/13 2041  . labetalol (NORMODYNE) tablet 100 mg  100 mg Oral Daily Canary Brim, NP   100 mg at 07/12/13 0903  . levothyroxine (SYNTHROID, LEVOTHROID) tablet 100 mcg  100 mcg Oral QAC breakfast Canary Brim, NP   100 mcg at 07/12/13 0646  . loperamide (IMODIUM) capsule 2-4 mg  2-4 mg Oral PRN Nanine Means, NP      . losartan (COZAAR) tablet 50 mg  50 mg Oral Daily Canary Brim, NP   50 mg at 07/12/13 0904  . magnesium hydroxide (MILK OF MAGNESIA) suspension 30 mL  30 mL Oral Daily PRN Nanine Means, NP      . mometasone-formoterol (DULERA) 100-5 MCG/ACT inhaler 2 puff  2 puff Inhalation BID Nanine Means, NP   2 puff at 07/12/13 0901  . montelukast (SINGULAIR) tablet 10 mg  10 mg Oral QHS Canary Brim, NP   10 mg at 07/11/13 2039  . nicotine (NICODERM CQ - dosed in mg/24 hours) patch 21 mg  21 mg Transdermal Q0600 Nanine Means, NP   21 mg at 07/12/13 0902  . ondansetron (ZOFRAN-ODT) disintegrating tablet 4 mg  4 mg Oral Q6H PRN Nanine Means, NP      . pantoprazole (PROTONIX) EC tablet 80 mg  80 mg Oral Q1200 Nanine Means, NP   80 mg  at 07/12/13 1145  . prasugrel (EFFIENT) tablet 10 mg  10 mg Oral Daily Canary Brim, NP   10 mg at 07/12/13 0903  . risperiDONE (RISPERDAL) tablet 0.5 mg  0.5 mg Oral BID Canary Brim, NP   0.5 mg at 07/12/13 6387    Lab Results:  No results found for this or any previous visit (from the past 48 hour(s)).  Physical Findings: AIMS: Facial and Oral Movements Muscles of Facial Expression: None, normal Lips and Perioral Area: None, normal Jaw: None, normal Tongue: None, normal,Extremity Movements Upper (arms, wrists, hands, fingers): None, normal Lower (legs, knees, ankles, toes): None, normal, Trunk Movements Neck, shoulders, hips: None, normal, Overall Severity Severity of abnormal movements (highest score from questions above): None, normal Incapacitation due to abnormal movements: None, normal Patient's awareness of abnormal movements (rate only patient's report): No Awareness, Dental Status Current problems with teeth and/or dentures?: No Does patient usually wear dentures?: No  CIWA:    COWS:     Treatment Plan Summary:   Review of chart, vital signs, medications and notes Daily contact with the patient to assess and evaluate synmptoms and progress in treatment  1. Continue crisis management and stabilization. Estimated length of stay 5-7 days past his current stay of 3 2.  Medication management to reduce current symptoms to base line and improve patient's overall level of functioning     Continue Risperdal 0.5 mg PO bid for auditory/visual hallucinations      Medications reviewed with the pateint and no untoward effects      Individual  and group therapy encouraged      Coping skills for depression, substance abuse, and anxiety 3.  Treat health problems as indicated. 4.  Develop treatment plan to decrease risk of relapse upon discharge and the need for readmission 5.  Psych-social education regarding relapse prevention and self care. 6.  Health care follow up as needed for medical problems 7.  Continue home medications where appropriate 8.  Disposition in progress may discharged tomorrow when she can contract for safety and continued to show clinical improvement  Medical Decision Making Problem Points:  Established problem, stable/improving (1) Data Points:  Review or order medicine tests (1) Review of medication regiment & side effects (2) Review of new medications or change in dosage (2)  I certify that inpatient services furnished can reasonably be expected to improve the patient's condition.   Cayde Held,JANARDHAHA R.   07/12/2013, 1:11 PM

## 2013-07-13 MED ORDER — GABAPENTIN 600 MG PO TABS
600.0000 mg | ORAL_TABLET | Freq: Four times a day (QID) | ORAL | Status: DC
  Administered 2013-07-13 – 2013-07-14 (×4): 600 mg via ORAL
  Filled 2013-07-13 (×3): qty 1
  Filled 2013-07-13 (×2): qty 42
  Filled 2013-07-13 (×2): qty 1
  Filled 2013-07-13: qty 42
  Filled 2013-07-13: qty 1
  Filled 2013-07-13: qty 42

## 2013-07-13 NOTE — Progress Notes (Signed)
Adult Psychoeducational Group Note  Date:  07/13/2013 Time:  11:00am Group Topic/Focus:  Recovery Goals:   The focus of this group is to identify appropriate goals for recovery and establish a plan to achieve them.  Participation Level:  Active  Participation Quality:  Appropriate and Attentive  Affect:  Appropriate  Cognitive:  Alert and Appropriate  Insight: Appropriate  Engagement in Group:  Engaged  Modes of Intervention:  Discussion and Education  Additional Comments:  Pt attended and participated in group. Discussion was on recovery and what it means to you. Pt stated recovery means changing inner thoughts and behaviors. Shelly Bombard D 07/13/2013, 1:32 PM

## 2013-07-13 NOTE — [facility] Suicide Risk Assessment (Signed)
[facility] INPATIENT:  Family/Significant Other Suicide Prevention Education  Suicide Prevention Education:  Education Completed;Dyann Kief, daughter, has been identified by the patient as the family member/significant other with whom the patient will be residing, and identified as the person(s) who will aid the patient in the event of a mental health crisis (suicidal ideations/suicide attempt).  With written consent from the patient, the family member/significant other has been provided the following suicide prevention education, prior to the and/or following the discharge of the patient.  The suicide prevention education provided includes the following:  Suicide risk factors  Suicide prevention and interventions  National Suicide Hotline telephone number  [facility] assessment telephone number  [facility] Emergency Assistance 911  [facility] and/or Residential Mobile Crisis Unit telephone number  Request made of family/significant other to:  Remove weapons (e.g., guns, rifles, knives), all items previously/currently identified as safety concern.    Remove drugs/medications (over-the-counter, prescriptions, illicit drugs), all items previously/currently identified as a safety concern.  The family member/significant other verbalizes understanding of the suicide prevention education information provided.  The family member/significant other agrees to remove the items of safety concern listed above.  Pervis Hocking 07/13/2013, 11:10 AM

## 2013-07-13 NOTE — Progress Notes (Cosign Needed)
D) Pt has been blunted, anxious, depressed. Pt c/o lower and mid back pain, and at times affect is constricted due to this. [name] [name] has been positive for all groups and activities with minimal prompting. Pt can be hyper focused on pain medications. Pt is denying s.i., says depression has decreased somewhat since being at [facility]. A) Level 3 obs for safety, support and encouragement provided. meds as ordered. 1:1 support from staff. Contract for safety. R) Pt has been receptive.

## 2013-07-13 NOTE — Progress Notes (Signed)
Patient ID: [name] [name], female   DOB: 1955/07/30, 57 y.o.   MRN: [id number] D: pt. Visible on unit in dayroom watching TV, interacting with others. Pt. Reports depression as "7" of 10. Pt. Reports some anxiety today. A: Writer introduced self to client and reviewed medication. Staff will monitor q60min for safety. R: Pt. Is safe on the unit. Pt. Attended group.

## 2013-07-13 NOTE — Progress Notes (Signed)
The focus of this group is to educate the patient on the purpose and policies of crisis stabilization and provide a format to answer questions about their admission.  The group details unit policies and expectations of patients while admitted.  Patient did not attend 0900 nurse education orientation group this morning.  Patient stayed in bed.   

## 2013-07-13 NOTE — [facility] Group Notes (Signed)
[facility] LCSW Group Therapy  07/13/2013  1:15 PM  Type of Therapy:  Group Therapy  Participation Level:  Active  Participation Quality:  Attentive, Sharing and Supportive  Affect:  Depressed and Flat  Cognitive:  Alert and Oriented  Insight:  Developing/Improving and Engaged  Engagement in Therapy:  Developing/Improving and Engaged  Modes of Intervention:  Clarification, Confrontation, Discussion, Education, Exploration, Limit-setting, Orientation, Problem-solving, Rapport Building, Dance movement psychotherapist, Socialization and Support  Summary of Progress/Problems: The topic for group therapy was feelings about diagnosis. Group members were encouraged to reflect on their past and current diagnosis and how they feel towards these diagnoses. Group members were guided to discuss thoughts and feelings related to how society and family members view their diagnoses.  Patient arrived late to group but appeared to integrate into group accordingly.  Patient made no spontaneous contributions to group, but she was able to participate when prompted.  Patient appeared to have limited insight on current and past diagnoses as she was unable to identify a specific diagnoses that she has received.  Despite having diagnosis, she expressed belief that her diagnoses were true.  Patient appeared to embrace diagnoses and acknowledged need for long term therapy and medication management in order to continue to make progress upon discharge.  Patient appears to be progressing in treatment AEB patient displaying a brighter affect and denying presence of AVH.  Patient also appears to be gaining insight on role of stress on her AVH and other depressive symptoms.

## 2013-07-13 NOTE — Progress Notes (Signed)
Adult Psychoeducational Group Note  Date:  07/13/2013 Time:  9:31 PM  Group Topic/Focus:  Wrap-Up Group:   The focus of this group is to help patients review their daily goal of treatment and discuss progress on daily workbooks.  Participation Level:  Active  Participation Quality:  Appropriate  Affect:  Appropriate  Cognitive:  Alert and Oriented  Insight: Appropriate  Engagement in Group:  Developing/Improving  Modes of Intervention:  Exploration and Support  Additional Comments:  Patient stated that for her recovery means being able to hold others accountable and attending her aftergroups.  Patient stated that she wants to work on staying focus. Patient stated that she is hoping that she will be able to leave tomorrow.  Tristin Gladman, Randal Buba 07/13/2013, 9:31 PM

## 2013-07-13 NOTE — Progress Notes (Signed)
[facility] MD Progress Note  07/13/2013 2:47 PM [name] [name]  MRN:  [id number] Subjective:  Pt reports that she is feeling much better today than before. Rates depression at 0/10 and anxiety at 2/10.  She feels "clear and focused" and reports that her feelings that people were "out to get her" before are not occurring today. She states "I'm just a servant to other people so I feel like if I'm not pleasing them, they might want to come after me at some point, but this is nothing new, it's been going on for years, just who I am". Pt reports a good support system which includes her daughter and other family members. Feels slightly sleepy, but is satisfied with sleep quality/duration. Good appetite as well. Denies SI, HI, and Psychosis. Pt expresses that she had some sleeping and eating problems (deficits) before admission and expresses some concern that she may have these problems again when she is discharged. Pt is expressing interest in outpatient treatment so that she can care for her mother who has Alzheimer's.   Diagnosis:   DSM5: Trauma-Stressor Disorders:  Posttraumatic Stress Disorder (309.81) Substance/Addictive Disorders:  Substance Abuse Depressive Disorders:  Major Depressive Disorder - with Psychotic Features (296.24)  Axis I: Major Depression, Recurrent severe, Post Traumatic Stress Disorder and Substance Abuse Axis II: Deferred Axis III:  Past Medical History  Diagnosis Date  . Thyroid disease   . Coronary artery disease   . Hernia   . Chronic back pain     herniated nucleus pulposus  . Hyperlipidemia     takes Lipitor daily  . Hypertension     takes Losartan daily and Labetalol bid  . Heart attack 2011  . Asthma   . COPD (chronic obstructive pulmonary disease)     early stages  . Shortness of breath     with exertion  . Bronchitis     couple of months ago  . Arthritis   . Joint pain   . Neck pain   . Bruises easily     pt is on Effient  . Psoriasis     elbows,knees,back   . H/O hiatal hernia   . GERD (gastroesophageal reflux disease)     takes Nexium daily  . Hemorrhoids   . Diverticulosis   . Slowing of urinary stream   . Hypothyroidism     takes Synthroid daily  . Depression     takes Klonopin daily  . Anxiety     takes Lexapro daily  . Insomnia     hydroxyzine prn  . Bartholin gland cyst 08/29/2011   Axis IV: other psychosocial or environmental problems and problems related to social environment Axis V: 41-50 serious symptoms  ADL's:  Intact  Sleep: Good  Appetite:  Good  Suicidal Ideation:  Plan:  Denies Intent:  Denies Means:  Denies Homicidal Ideation:  Plan:  Denies Intent:  Denies Means:  Denies AEB (as evidenced by):  Psychiatric Specialty Exam: Review of Systems  Constitutional: Negative.   HENT: Negative.   Eyes: Negative.   Respiratory: Negative.   Cardiovascular: Negative.   Gastrointestinal: Negative.   Genitourinary: Negative.   Musculoskeletal: Negative.   Skin: Negative.   Neurological: Negative.   Endo/Heme/Allergies: Negative.   Psychiatric/Behavioral: Negative.     Blood pressure 106/71, pulse 87, temperature 97.9 F (36.6 C), temperature source Oral, resp. rate 16, height 5\' 1"  (1.549 m), weight 58.514 kg (129 lb).Body mass index is 24.39 kg/(m^2).  General Appearance: Casual  Eye Contact::  Good  Speech:  Normal Rate  Volume:  Normal  Mood:  Euthymic  Affect:  Appropriate  Thought Process:  Coherent  Orientation:  Full (Time, Place, and Person)  Thought Content:  WDL  Suicidal Thoughts:  No  Homicidal Thoughts:  No  Memory:  Immediate;   Good Recent;   Good Remote;   Good  Judgement:  Fair  Insight:  Fair  Psychomotor Activity:  Normal  Concentration:  Good  Recall:  Good  Akathisia:  No  Handed:  Right  AIMS (if indicated):     Assets:  Communication Skills Desire for Improvement Resilience  Sleep:  Number of Hours: 6.75   Current Medications: Current Facility-Administered  Medications  Medication Dose Route Frequency Provider Last Rate Last Dose  . acetaminophen (TYLENOL) tablet 650 mg  650 mg Oral Q6H PRN Nanine Means, NP   650 mg at 07/13/13 1000  . albuterol (PROVENTIL HFA;VENTOLIN HFA) 108 (90 BASE) MCG/ACT inhaler 2 puff  2 puff Inhalation Q6H PRN Nanine Means, NP      . alum & mag hydroxide-simeth (MAALOX/MYLANTA) 200-200-20 MG/5ML suspension 30 mL  30 mL Oral Q4H PRN Nanine Means, NP      . aspirin tablet 325 mg  325 mg Oral Daily Nanine Means, NP   325 mg at 07/13/13 0739  . atorvastatin (LIPITOR) tablet 40 mg  40 mg Oral q1800 Canary Brim, NP   40 mg at 07/12/13 1610  . clonazePAM (KLONOPIN) tablet 0.5 mg  0.5 mg Oral BID PRN Nanine Means, NP   0.5 mg at 07/13/13 0959  . escitalopram (LEXAPRO) tablet 10 mg  10 mg Oral Daily Nanine Means, NP   10 mg at 07/13/13 0741  . fluticasone (FLONASE) 50 MCG/ACT nasal spray 2 spray  2 spray Each Nare QHS Nanine Means, NP   2 spray at 07/12/13 2138  . furosemide (LASIX) tablet 20 mg  20 mg Oral Daily Nanine Means, NP   20 mg at 07/13/13 9604  . gabapentin (NEURONTIN) tablet 600 mg  600 mg Oral QID Nanine Means, NP   600 mg at 07/13/13 1204  . labetalol (NORMODYNE) tablet 100 mg  100 mg Oral Daily Canary Brim, NP   100 mg at 07/13/13 0741  . levothyroxine (SYNTHROID, LEVOTHROID) tablet 100 mcg  100 mcg Oral QAC breakfast Canary Brim, NP   100 mcg at 07/13/13 5409  . losartan (COZAAR) tablet 50 mg  50 mg Oral Daily Canary Brim, NP   50 mg at 07/13/13 0739  . magnesium hydroxide (MILK OF MAGNESIA) suspension 30 mL  30 mL Oral Daily PRN Nanine Means, NP      . mometasone-formoterol (DULERA) 100-5 MCG/ACT inhaler 2 puff  2 puff Inhalation BID Nanine Means, NP   2 puff at 07/13/13 0740  . montelukast (SINGULAIR) tablet 10 mg  10 mg Oral QHS Canary Brim, NP   10 mg at 07/12/13 2136  . nicotine (NICODERM CQ - dosed in mg/24 hours) patch 21 mg  21 mg Transdermal Q0600 Nanine Means, NP   21 mg at 07/13/13 8119  .  pantoprazole (PROTONIX) EC tablet 80 mg  80 mg Oral Q1200 Nanine Means, NP   80 mg at 07/13/13 1204  . prasugrel (EFFIENT) tablet 10 mg  10 mg Oral Daily Canary Brim, NP   10 mg at 07/13/13 0739  . risperiDONE (RISPERDAL) tablet 0.5 mg  0.5 mg Oral BID Canary Brim, NP   0.5 mg  at 07/13/13 1610    Lab Results: No results found for this or any previous visit (from the past 48 hour(s)).  Physical Findings: AIMS: Facial and Oral Movements Muscles of Facial Expression: None, normal Lips and Perioral Area: None, normal Jaw: None, normal Tongue: None, normal,Extremity Movements Upper (arms, wrists, hands, fingers): None, normal Lower (legs, knees, ankles, toes): None, normal, Trunk Movements Neck, shoulders, hips: None, normal, Overall Severity Severity of abnormal movements (highest score from questions above): None, normal Incapacitation due to abnormal movements: None, normal Patient's awareness of abnormal movements (rate only patient's report): No Awareness, Dental Status Current problems with teeth and/or dentures?: No Does patient usually wear dentures?: No  CIWA:    COWS:     Treatment Plan Summary: Daily contact with patient to assess and evaluate symptoms and progress in treatment Medication management  Plan:  Review of chart, vital signs, medications, and notes.  1-Individual and group therapy  2-Medication management for depression and anxiety: Medications reviewed with the patient and she stated no untoward effects, no changes made  3-Coping skills for depression, anxiety  4-Continue crisis stabilization and management  5-Address health issues--monitoring vital signs, stable  6-Treatment plan in progress to prevent relapse of depression and anxiety  Medical Decision Making Problem Points:  Established problem, stable/improving (1) Data Points:  Review of medication regiment & side effects (2) Review of new medications or change in dosage (2)  I certify that  inpatient services furnished can reasonably be expected to improve the patient's condition.   Beau Fanny, FNP-BC 07/13/2013, 2:47 PM Agree with assessment and plan Madie Reno A. Dub Mikes, M.D.

## 2013-07-14 MED ORDER — GABAPENTIN 600 MG PO TABS: 600.0000 mg | ORAL_TABLET | Freq: Four times a day (QID) | ORAL | Status: DC

## 2013-07-14 MED ORDER — CLONAZEPAM 0.5 MG PO TABS: 0.5000 mg | ORAL_TABLET | Freq: Two times a day (BID) | ORAL | Status: DC | PRN

## 2013-07-14 MED ORDER — FUROSEMIDE 20 MG PO TABS: 20.0000 mg | ORAL_TABLET | Freq: Every day | ORAL | Status: DC

## 2013-07-14 MED ORDER — ESCITALOPRAM OXALATE 10 MG PO TABS: 10.0000 mg | ORAL_TABLET | Freq: Every day | ORAL | Status: DC

## 2013-07-14 MED ORDER — RISPERIDONE 0.5 MG PO TABS: 0.5000 mg | ORAL_TABLET | Freq: Two times a day (BID) | ORAL | Status: DC

## 2013-07-14 NOTE — Progress Notes (Signed)
Pt asleep, unlabored even breathing. No signs of distress or complaints at this time. q 15 min safety checks. Pt remains safe on unit 

## 2013-07-14 NOTE — [facility] Suicide Risk Assessment (Signed)
Suicide Risk Assessment  Discharge Assessment     Demographic Factors:  Caucasian  Mental Status Per Nursing Assessment::   On Admission:     Current Mental Status by Physician: In full contact with reality. There are no suicidal ideas, plans or intent. Her mood is euthymic, her affect is appropriate. She is insightful. She plans to modify things in her life style to have time for herself and not burn out as she did before she came here. She had also quit going for outpatient follow up and taking her medications what made thing worst.   Loss Factors: Decline in physical health  Historical Factors: NA  Risk Reduction Factors:   Sense of responsibility to family, Employed, Living with another person, especially a relative and Positive social support  Continued Clinical Symptoms:  Depression:   Insomnia  Cognitive Features That Contribute To Risk:  Polarized thinking Thought constriction (tunnel vision)    Suicide Risk:  Minimal: No identifiable suicidal ideation.  Patients presenting with no risk factors but with morbid ruminations; may be classified as minimal risk based on the severity of the depressive symptoms  Discharge Diagnoses:   AXIS I:  Major Depression Anxiety Disorder NOS AXIS II:  Deferred AXIS III:   Past Medical History  Diagnosis Date  . Thyroid disease   . Coronary artery disease   . Hernia   . Chronic back pain     herniated nucleus pulposus  . Hyperlipidemia     takes Lipitor daily  . Hypertension     takes Losartan daily and Labetalol bid  . Heart attack 2011  . Asthma   . COPD (chronic obstructive pulmonary disease)     early stages  . Shortness of breath     with exertion  . Bronchitis     couple of months ago  . Arthritis   . Joint pain   . Neck pain   . Bruises easily     pt is on Effient  . Psoriasis     elbows,knees,back  . H/O hiatal hernia   . GERD (gastroesophageal reflux disease)     takes Nexium daily  . Hemorrhoids   .  Diverticulosis   . Slowing of urinary stream   . Hypothyroidism     takes Synthroid daily  . Depression     takes Klonopin daily  . Anxiety     takes Lexapro daily  . Insomnia     hydroxyzine prn  . Bartholin gland cyst 08/29/2011   AXIS IV:  other psychosocial or environmental problems AXIS V:  61-70 mild symptoms  Plan Of Care/Follow-up recommendations:  Activity:  as tolerated Diet:  regular Follow up [facility] Is patient on multiple antipsychotic therapies at discharge:  No   Has Patient had three or more failed trials of antipsychotic monotherapy by history:  No  Recommended Plan for Multiple Antipsychotic Therapies: NA  Mathan Darroch A 07/14/2013, 12:50 PM

## 2013-07-14 NOTE — Tx Team (Addendum)
Interdisciplinary Treatment Plan Update (Adult)  Date: 07/14/2013  Time Reviewed:  9:45 AM  Progress in Treatment: Attending groups: Yes Participating in groups:  Yes Taking medication as prescribed:  Yes Tolerating medication:  Yes Family/Significant othe contact made: Yes, with pt's daughter Patient understands diagnosis:  Yes Discussing patient identified problems/goals with staff:  Yes Medical problems stabilized or resolved:  Yes Denies suicidal/homicidal ideation: Yes Issues/concerns per patient self-inventory:  Yes Other:  New problem(s) identified: N/A  Discharge Plan or Barriers: Pt will follow up at [facility] for medication management and therapy.    Reason for Continuation of Hospitalization: Stable to d/c today  Comments: N/A  Estimated length of stay: D/C today  For review of initial/current patient goals, please see plan of care.  Attendees: Patient:  [name] [name]  07/14/2013 10:32 AM   Family:     Physician:   07/14/2013 10:32 AM   Nursing:    07/14/2013 10:32 AM   Clinical Social Worker:  Reyes Ivan, LCSW 07/14/2013 10:32 AM   Other: Nanine Means, NP 07/14/2013 10:32 AM   Other:  Elizbeth Squires, care coordination 07/14/2013 10:32 AM   Other:  Juline Patch, LCSW 07/14/2013 10:32 AM   Other:  Lowella Grip, RN 07/14/2013 10:33 AM   Other:  Burnetta Sabin, RN 07/14/2013 10:33 AM   Other:  Valentina Shaggy, RN 07/14/2013 10:33 AM   Other: Claudette Head, NP 07/14/2013 10:34 AM   Other:    Other:      Scribe for Treatment Team:   Carmina Miller, 07/14/2013 , 10:32 AM

## 2013-07-14 NOTE — Progress Notes (Signed)
Patient ID: [name] [name], female   DOB: 02/11/1956, 57 y.o.   MRN: [id number] Patient discharged per physician order; patient denies SI/HI and A/V hallucinations; patient received samples, prescriptions, and copy of AVS after it was reviewed; patient had no other questions or concerns at this time; patient verbalized and signed that she received all belongings; patient left the unit ambulatory

## 2013-07-14 NOTE — [facility] Group Notes (Signed)
[facility] LCSW Aftercare Discharge Planning Group Note   07/14/2013 8:45 AM  Participation Quality:  Alert, Appropriate and Oriented  Mood/Affect:  Calm  Depression Rating:  2  Anxiety Rating:  2  Thoughts of Suicide:  Pt denies SI/HI  Will you contract for safety?   Yes  Current AVH:  Pt denies  Plan for Discharge/Comments:  Pt attended discharge planning group and actively participated in group.  CSW provided pt with today's workbook.  Pt reports feeling stable to d/c today.  Pt will return home in [place] and follow up at [facility] for medication management and therapy.  No further needs voiced by pt at this time.    Transportation Means: Pt reports access to transportation - daughter will pick pt up  Supports: No supports mentioned at this time  [name] Ivan, LCSW 07/14/2013 10:23 AM

## 2013-07-14 NOTE — Progress Notes (Signed)
[facility] Adult Case Management Discharge Plan :  Will you be returning to the same living situation after discharge: Yes,  returning home At discharge, do you have transportation home?:Yes,  daughter will pick pt up Do you have the ability to pay for your medications:Yes,  access to meds  Release of information consent forms completed and in the chart;  Patient's signature needed at discharge.  Patient to Follow up at: Follow-up Information   Follow up with [facility] On 07/19/2013. (For therapy and medication management.  Follow-up at walk-in clinic Monday - Friday from 8am - 3pm for hospital discharge appointment. )    Contact information:   [address] [place], [place] Phone: [phone] Fax: [phone]      Patient denies SI/HI:   Yes,  denies SI/HI    Safety Planning and Suicide Prevention discussed:  Yes,  discussed with pt and pt's daughter.  See suicide prevention education note.   Carmina Miller 07/14/2013, 10:41 AM

## 2013-07-14 NOTE — Progress Notes (Signed)
Adult Psychoeducational Group Note  Date:  07/14/2013 Time:  11:00am Group Topic/Focus:  Personal Choices and Values:   The focus of this group is to help patients assess and explore the importance of values in their lives, how their values affect their decisions, how they express their values and what opposes their expression.  Participation Level:  Active  Participation Quality:  Appropriate and Attentive  Affect:  Appropriate  Cognitive:  Alert and Appropriate  Insight: Appropriate  Engagement in Group:  Engaged  Modes of Intervention:  Discussion and Education  Additional Comments:Pt attended and participated in group. Discussion was on New Year's Resolution and the positive change you want to make in your personal development.  Pt stated her gola was to take time for herself and start attending church again.    Shelly Bombard D 07/14/2013, 1:23 PM

## 2013-07-15 NOTE — Discharge Summary (Signed)
Physician Discharge Summary Note  Patient:  [name] [name] is an 58 y.o., female MRN:  [id number] DOB:  February 05, 1956 Patient phone:  [phone] (home)  Patient address:   [address] [place] [address]   Date of Admission:  07/10/2013 Date of Discharge: 07/14/2013  Reason for Admission:  Depression and suicidal ideations  Discharge Diagnoses: Principal Problem:   MDD (major depressive disorder) Active Problems:   Major depressive disorder, recurrent episode, severe, specified as with psychotic behavior  Axis Diagnosis:  Axis I:  MDD, recurrent episode, severe, specified as with psychotic behavior; anxiety Axis II:  None Axis III:  Hyperlipidemia, HTN, hypthyroidism, COPD, GERD Axis IV:  Psychosocial and legal stressors Axis V:  70; mild symptoms  Level of Care:  OP  Hospital Course:   On admission:  58 y.o. female with a history of depression and anxiety who has been followed at the [facility] mental health clinic. She presents with a 1-2 week history of increasing depression, anxiety, suicidal ideation, and uncontrollable crying. Patient also paranoid with complaints of shadows and people walking in her room at night. Onset of the visual hallucinations started 2 days ago. Presently denies SI, HI.  She attributes this to multiple stressors including loss of her job and the fact that mother has Alzheimer's disease. The patient states that she has been drinking alcohol to excess and has started using crack cocaine again. She reports 8 yrs of sobriety until 2 weeks ago. She has never been hospitalized for mental health problems in the past.  However, she has received inpatient rehab at [facility] and [facility] for her history of drug use in the past (approx. 2006 -2007). Patient also received a recent DUI with a upcoming court date 08/10/2012.  During hospitalization:  Medications managed--Her home medical medications were continued during hospitalization including her Lexapro 10  mg daily for depression.  Her Klonopin 1 mg BID was decreased to 0.5 mg BID for anxiety.  Risperdal 0.5 mg BID for psychosis and Protonix 80 mg daily for GERD started.  Her Ambien for sleep issues and Tramadol and Percocet were not continued.  [name] [name] attended and participated in therapy.  She will continue her recovery by staying on her medications, keep her appointments, use her social network and family to prevent relapse.  Patient denied suicidal/homicidal ideations and auditory/visual hallucinations, follow-up appointments encouraged to attend, Rx and 14 day supply of medications given.  [name] is mentally and physically stable for discharge.  While a patient in this hospital, [name] [name] was enrolled in group counseling and activities as well as received the following medication No current facility-administered medications for this encounter. Current outpatient prescriptions:albuterol (PROVENTIL HFA;VENTOLIN HFA) 108 (90 BASE) MCG/ACT inhaler, Inhale 2 puffs into the lungs every 6 (six) hours as needed. For shortess of breath, Disp: 1 Inhaler, Rfl: 3;  aspirin 325 MG tablet, Take 1 tablet (325 mg total) by mouth daily., Disp: , Rfl: ;  atorvastatin (LIPITOR) 40 MG tablet, Take 1 tablet by mouth daily. <Please make appointment for future refills>, Disp: 90 tablet, Rfl: 3 Calcium 600 MG tablet, , Disp: 60 tablet, Rfl: ;  clonazePAM (KLONOPIN) 0.5 MG tablet, Take 1 tablet (0.5 mg total) by mouth 2 (two) times daily as needed (anxiety)., Disp: 14 tablet, Rfl: 0;  escitalopram (LEXAPRO) 10 MG tablet, Take 1 tablet (10 mg total) by mouth daily., Disp: 30 tablet, Rfl: 0;  esomeprazole (NEXIUM) 40 MG capsule, Take 1 capsule (40 mg total) by mouth  daily., Disp: 30 capsule, Rfl: 3 fluticasone (FLONASE) 50 MCG/ACT nasal spray, Place 2 sprays into both nostrils at bedtime., Disp: 16 g, Rfl: 3;  Fluticasone-Salmeterol (ADVAIR) 100-50 MCG/DOSE AEPB, Inhale 1 puff into the lungs 2 (two) times daily., Disp: 1 each, Rfl:  3;  furosemide (LASIX) 20 MG tablet, Take 1 tablet (20 mg total) by mouth daily., Disp: 30 tablet, Rfl: 3 gabapentin (NEURONTIN) 600 MG tablet, Take 1 tablet (600 mg total) by mouth 4 (four) times daily., Disp: 120 tablet, Rfl: 0;  labetalol (NORMODYNE) 100 MG tablet, Take 1 tablet (100 mg total) by mouth once., Disp: 90 tablet, Rfl: 3;  levothyroxine (SYNTHROID, LEVOTHROID) 100 MCG tablet, Take 1 tablet (100 mcg total) by mouth daily., Disp: 90 tablet, Rfl: 3 losartan (COZAAR) 50 MG tablet, Take 1 tablet (50 mg total) by mouth daily., Disp: 90 tablet, Rfl: 3;  montelukast (SINGULAIR) 10 MG tablet, Take 1 tablet (10 mg total) by mouth at bedtime., Disp: 30 tablet, Rfl: 3;  prasugrel (EFFIENT) 10 MG TABS tablet, Take 1 tablet (10 mg total) by mouth daily., Disp: 30 tablet, Rfl: ;  risperiDONE (RISPERDAL) 0.5 MG tablet, Take 1 tablet (0.5 mg total) by mouth 2 (two) times daily., Disp: 60 tablet, Rfl: 0  Patient attended treatment team meeting this am and met with treatment team members. Pt symptoms, treatment plan and response to treatment discussed. [name] [name] endorsed that their symptoms have improved. Pt also stated that they are stable for discharge.  In other to control Principal Problem:   MDD (major depressive disorder) Active Problems:   Major depressive disorder, recurrent episode, severe, specified as with psychotic behavior , they will continue psychiatric care on outpatient basis. They will follow-up at  Follow-up Information   Follow up with [facility] On 07/19/2013. (For therapy and medication management.  Follow-up at walk-in clinic Monday - Friday from [place] for hospital discharge appointment. )    Contact information:   [place], [place] Phone: [phone] Fax: [phone]    .  In addition they were instructed to take all your medications as prescribed by your mental healthcare provider, to report any adverse effects and or reactions from your medicines to  your outpatient provider promptly, patient is instructed and cautioned to not engage in alcohol and or illegal drug use while on prescription medicines, in the event of worsening symptoms, patient is instructed to call the crisis hotline, 911 and or go to the nearest ED for appropriate evaluation and treatment of symptoms.   Upon discharge, patient adamantly denies suicidal, homicidal ideations, auditory, visual hallucinations and or delusional thinking. They left [facility] with all personal belongings in no apparent distress.  Consults:  See electronic record for details  Significant Diagnostic Studies:  See electronic record for details  Discharge Vitals:   Blood pressure 116/74, [name] 81, temperature 98.4 F (36.9 C), temperature source Oral, resp. rate 18, height '5\' 1"'  (1.549 m), weight 58.514 kg (129 lb)..  Mental Status Exam: See Mental Status Examination and Suicide Risk Assessment completed by Attending Physician prior to discharge.  Discharge destination:  Home  Is patient on multiple antipsychotic therapies at discharge:  No  Has Patient had three or more failed trials of antipsychotic monotherapy by history: N/A Recommended Plan for Multiple Antipsychotic Therapies: N/A Discharge Orders   Future Appointments Provider Department Dept Phone   07/26/2013 3:00 PM Troy Sine, MD [facility] [phone]   09/07/2013 10:15 AM Lorayne Marek, MD [place]  [facility] [phone]   Future Orders Complete By Expires   Activity as tolerated - No restrictions  As directed    Diet - low sodium heart healthy  As directed        Medication List    STOP taking these medications       amoxicillin 500 MG capsule  Commonly known as:  AMOXIL     fish oil-omega-3 fatty acids 1000 MG capsule     guaiFENesin-codeine 100-10 MG/5ML syrup     oxyCODONE-acetaminophen 5-325 MG per tablet  Commonly known as:  PERCOCET/ROXICET     PredniSONE 10 MG Kit      traMADol 50 MG tablet  Commonly known as:  ULTRAM     triamcinolone cream 0.1 %  Commonly known as:  KENALOG     zolpidem 5 MG tablet  Commonly known as:  AMBIEN      TAKE these medications     Indication   albuterol 108 (90 BASE) MCG/ACT inhaler  Commonly known as:  PROVENTIL HFA;VENTOLIN HFA  Inhale 2 puffs into the lungs every 6 (six) hours as needed. For shortess of breath      aspirin 325 MG tablet  Take 1 tablet (325 mg total) by mouth daily.   Indication:  Blood Clot     atorvastatin 40 MG tablet  Commonly known as:  LIPITOR  Take 1 tablet by mouth daily. <Please make appointment for future refills>   Indication:  hyperlipidemia     Calcium 600 MG tablet   Indication:  Low Amount of Calcium in the Blood     clonazePAM 0.5 MG tablet  Commonly known as:  KLONOPIN  Take 1 tablet (0.5 mg total) by mouth 2 (two) times daily as needed (anxiety).      escitalopram 10 MG tablet  Commonly known as:  LEXAPRO  Take 1 tablet (10 mg total) by mouth daily.   Indication:  Depression     fluticasone 50 MCG/ACT nasal spray  Commonly known as:  FLONASE  Place 2 sprays into both nostrils at bedtime.   Indication:  Hayfever     Fluticasone-Salmeterol 100-50 MCG/DOSE Aepb  Commonly known as:  ADVAIR  Inhale 1 puff into the lungs 2 (two) times daily.   Indication:  Chronic Obstructive Lung Disease     furosemide 20 MG tablet  Commonly known as:  LASIX  Take 1 tablet (20 mg total) by mouth daily.   Indication:  Edema, High Blood Pressure     gabapentin 600 MG tablet  Commonly known as:  NEURONTIN  Take 1 tablet (600 mg total) by mouth 4 (four) times daily.   Indication:  Neuropathic Pain     labetalol 100 MG tablet  Commonly known as:  NORMODYNE  Take 1 tablet (100 mg total) by mouth once.   Indication:  High Blood Pressure     levothyroxine 100 MCG tablet  Commonly known as:  SYNTHROID, LEVOTHROID  Take 1 tablet (100 mcg total) by mouth daily.   Indication:   Underactive Thyroid     losartan 50 MG tablet  Commonly known as:  COZAAR  Take 1 tablet (50 mg total) by mouth daily.   Indication:  High Blood Pressure     montelukast 10 MG tablet  Commonly known as:  SINGULAIR  Take 1 tablet (10 mg total) by mouth at bedtime.   Indication:  Hayfever     NEXIUM 40 MG capsule  Generic drug:  esomeprazole  Take  1 capsule (40 mg total) by mouth daily.   Indication:  Gastroesophageal Reflux Disease with Current Symptoms     prasugrel 10 MG Tabs tablet  Commonly known as:  EFFIENT  Take 1 tablet (10 mg total) by mouth daily.   Indication:  Acute Coronary Syndrome     risperiDONE 0.5 MG tablet  Commonly known as:  RISPERDAL  Take 1 tablet (0.5 mg total) by mouth 2 (two) times daily.   Indication:  visual hallucinations           Follow-up Information   Follow up with [facility] On 07/19/2013. (For therapy and medication management.  Follow-up at walk-in clinic Monday - Friday from [place] for hospital discharge appointment. )    Contact information:   [place], [place] Phone: [phone] Fax: [phone]     Follow-up recommendations:   Activities: Resume typical activities Diet: Resume typical diet Tests: none Other: Follow up with outpatient provider and report any side effects to out patient prescriber.  Comments:   Take all your medications as prescribed by your mental healthcare provider. Report any adverse effects and or reactions from your medicines to your outpatient provider promptly. Patient is instructed and cautioned to not engage in alcohol and or illegal drug use while on prescription medicines. In the event of worsening symptoms, patient is instructed to call the crisis hotline, 911 and or go to the nearest ED for appropriate evaluation and treatment of symptoms. Follow-up with your primary care provider for your other medical issues, concerns and or health care needs.  Discharge Total Time:   Greater than 30 minutes.  SignedWaylan Boga, [place] 07/15/2013 1:49 PM  Patient was seen for psychiatric evaluation or, suicide risk assessment and case discussed with physician extender. Initially formulated TreTreatment plan and then disposition plan developed. Reviewed the information documented and agree with the treatment plan.  Jodi Criscuolo,JANARDHAHA R. 07/17/2013 12:17 PM

## 2013-07-19 NOTE — Progress Notes (Signed)
Patient Discharge Instructions:  After Visit Summary (AVS):   Faxed to:  07/19/13 Discharge Summary Note:   Faxed to:  07/19/13 Psychiatric Admission Assessment Note:   Faxed to:  07/19/13 Suicide Risk Assessment - Discharge Assessment:   Faxed to:  07/19/13 Faxed/Sent to the Next Level Care provider:  07/19/13 Faxed to [facility] @ [place], 07/19/2013, 3:48 PM

## 2013-07-20 ENCOUNTER — Telehealth: Payer: Self-pay | Admitting: *Deleted

## 2013-07-20 NOTE — Telephone Encounter (Signed)
Pt's daughter called about her mom being on a patient assistance for effient. She is out of medication. Pt has been in the hospital, but her daughter was calling to clarify if we have her supply here that was delivered.  TK

## 2013-07-20 NOTE — Telephone Encounter (Signed)
[place], pt's daughter, called back and pt verified x 2.  Informed message was received and advised as stated below.  Stated pt was recently discharged from the hospital and never picked up samples.  RN confirmed samples still at front desk and informed daughter.  Agreed to pick up samples and that pt will be at appt.  Stated she has pt's tax forms and will make sure she brings them to appt.

## 2013-07-20 NOTE — Telephone Encounter (Signed)
Pt given 4 weeks of samples on 12.16.14 and appt on 1.12.15 w/ Dr. Claiborne Billings.  Last OV was Sept 2013.  Pt will need to be seen before anymore samples of meds given.  Pt will also need to see Erasmo Downer, PharmD to complete new forms and bring in tax forms for proof of income, per Erasmo Downer.  Returned call.  Left message to call back before 4pm.

## 2013-07-23 ENCOUNTER — Other Ambulatory Visit: Payer: Self-pay | Admitting: Internal Medicine

## 2013-07-23 DIAGNOSIS — I10 Essential (primary) hypertension: Secondary | ICD-10-CM

## 2013-07-23 MED ORDER — LABETALOL HCL 100 MG PO TABS: 100.0000 mg | ORAL_TABLET | Freq: Two times a day (BID) | ORAL | Status: DC

## 2013-07-26 ENCOUNTER — Encounter: Payer: Self-pay | Admitting: Cardiovascular Disease

## 2013-07-26 ENCOUNTER — Ambulatory Visit (INDEPENDENT_AMBULATORY_CARE_PROVIDER_SITE_OTHER): Payer: No Typology Code available for payment source | Admitting: Cardiovascular Disease

## 2013-07-26 VITALS — BP 130/88 | HR 60 | Ht 61.5 in | Wt 143.6 lb

## 2013-07-26 DIAGNOSIS — F17200 Nicotine dependence, unspecified, uncomplicated: Secondary | ICD-10-CM

## 2013-07-26 DIAGNOSIS — K219 Gastro-esophageal reflux disease without esophagitis: Secondary | ICD-10-CM

## 2013-07-26 DIAGNOSIS — F329 Major depressive disorder, single episode, unspecified: Secondary | ICD-10-CM

## 2013-07-26 DIAGNOSIS — I2510 Atherosclerotic heart disease of native coronary artery without angina pectoris: Secondary | ICD-10-CM

## 2013-07-26 DIAGNOSIS — R079 Chest pain, unspecified: Secondary | ICD-10-CM

## 2013-07-26 DIAGNOSIS — E039 Hypothyroidism, unspecified: Secondary | ICD-10-CM

## 2013-07-26 DIAGNOSIS — Z9861 Coronary angioplasty status: Secondary | ICD-10-CM

## 2013-07-26 DIAGNOSIS — F3289 Other specified depressive episodes: Secondary | ICD-10-CM

## 2013-07-26 NOTE — Patient Instructions (Signed)
Your physician recommends that you schedule a follow-up appointment in: 6 weeks.  Your physician has requested that you have a lexiscan myoview. For further information please visit [email or web address]. Please follow instruction sheet, as given.

## 2013-07-30 ENCOUNTER — Telehealth (HOSPITAL_COMMUNITY): Payer: Self-pay | Admitting: *Deleted

## 2013-08-03 ENCOUNTER — Telehealth (HOSPITAL_COMMUNITY): Payer: Self-pay | Admitting: *Deleted

## 2013-08-03 NOTE — Telephone Encounter (Signed)
Returned call and pt verified x 2.  Pt stated she wanted to speak w/ [name] [name] about a program for her Effient that she qualifies for.  Pt informed [name] [name] has gone for the day and will be notified to call her at her earliest convenience.  Pt stated it has to be in the morning.  Pt informed she will be notified, but RN cannot guarantee a call back in the morning.  Pt verbalized understanding and agreed w/ plan.  Message forwarded to [name] [name], PharmD.

## 2013-08-03 NOTE — Telephone Encounter (Signed)
Pt says that she needs to speak to someone maybe Cyril Mourning regarding some paperwork that needs to be filled out to get her medications.

## 2013-08-04 NOTE — Telephone Encounter (Signed)
Spoke with patient, she has paperwork ready to send in, advised her to bring to office, attention [place].

## 2013-08-17 ENCOUNTER — Ambulatory Visit (HOSPITAL_COMMUNITY)
Admission: RE | Admit: 2013-08-17 | Discharge: 2013-08-17 | Disposition: A | Discharge status: Home / Self Care | Payer: No Typology Code available for payment source | Source: Ambulatory Visit | Attending: Internal Medicine | Admitting: Internal Medicine

## 2013-08-17 ENCOUNTER — Encounter: Payer: Self-pay | Admitting: Cardiovascular Disease

## 2013-08-17 DIAGNOSIS — J4489 Other specified chronic obstructive pulmonary disease: Secondary | ICD-10-CM | POA: Insufficient documentation

## 2013-08-17 DIAGNOSIS — R0609 Other forms of dyspnea: Secondary | ICD-10-CM | POA: Insufficient documentation

## 2013-08-17 DIAGNOSIS — E663 Overweight: Secondary | ICD-10-CM | POA: Insufficient documentation

## 2013-08-17 DIAGNOSIS — E785 Hyperlipidemia, unspecified: Secondary | ICD-10-CM | POA: Insufficient documentation

## 2013-08-17 DIAGNOSIS — I252 Old myocardial infarction: Secondary | ICD-10-CM | POA: Insufficient documentation

## 2013-08-17 DIAGNOSIS — R5381 Other malaise: Secondary | ICD-10-CM | POA: Insufficient documentation

## 2013-08-17 DIAGNOSIS — Z9861 Coronary angioplasty status: Secondary | ICD-10-CM | POA: Insufficient documentation

## 2013-08-17 DIAGNOSIS — J449 Chronic obstructive pulmonary disease, unspecified: Secondary | ICD-10-CM | POA: Insufficient documentation

## 2013-08-17 DIAGNOSIS — F17200 Nicotine dependence, unspecified, uncomplicated: Secondary | ICD-10-CM | POA: Insufficient documentation

## 2013-08-17 DIAGNOSIS — I10 Essential (primary) hypertension: Secondary | ICD-10-CM | POA: Insufficient documentation

## 2013-08-17 DIAGNOSIS — I2510 Atherosclerotic heart disease of native coronary artery without angina pectoris: Secondary | ICD-10-CM | POA: Insufficient documentation

## 2013-08-17 DIAGNOSIS — R079 Chest pain, unspecified: Secondary | ICD-10-CM | POA: Insufficient documentation

## 2013-08-17 DIAGNOSIS — R5383 Other fatigue: Secondary | ICD-10-CM

## 2013-08-17 DIAGNOSIS — R0989 Other specified symptoms and signs involving the circulatory and respiratory systems: Secondary | ICD-10-CM | POA: Insufficient documentation

## 2013-08-17 DIAGNOSIS — Z8249 Family history of ischemic heart disease and other diseases of the circulatory system: Secondary | ICD-10-CM | POA: Insufficient documentation

## 2013-08-17 MED ORDER — TECHNETIUM TC 99M SESTAMIBI GENERIC - CARDIOLITE
30.0000 | Freq: Once | INTRAVENOUS | Status: AC | PRN
  Administered 2013-08-17: 30 via INTRAVENOUS

## 2013-08-17 MED ORDER — TECHNETIUM TC 99M SESTAMIBI GENERIC - CARDIOLITE
10.0000 | Freq: Once | INTRAVENOUS | Status: AC | PRN
  Administered 2013-08-17: 10 via INTRAVENOUS

## 2013-08-17 MED ORDER — REGADENOSON 0.4 MG/5ML IV SOLN
0.4000 mg | Freq: Once | INTRAVENOUS | Status: AC
  Administered 2013-08-17: 0.4 mg via INTRAVENOUS

## 2013-08-17 MED ORDER — AMINOPHYLLINE 25 MG/ML IV SOLN
75.0000 mg | Freq: Once | INTRAVENOUS | Status: AC
  Administered 2013-08-17: 75 mg via INTRAVENOUS

## 2013-08-17 NOTE — Procedures (Addendum)
[facility] [address] [place] [address] [phone]  Cardiology Nuclear Med Study  [name] [name] is a 58 y.o. female     MRN : [id number]     DOB: 1956-03-15  Procedure Date: 08/17/2013  Nuclear Med Background Indication for Stress Test:  Stent Patency History:  Asthma, COPD and CAD;MI-2011;STENT/PTCA Cardiac Risk Factors: Family History - CAD, Hypertension, Lipids, Overweight and Smoker  Symptoms:  Chest Pain, DOE and Fatigue   Nuclear Pre-Procedure Caffeine/Decaff Intake:  7:00pm NPO After: 5:00am   IV Site: R Hand  IV 0.9% NS with Angio Cath:  22g  Chest Size (in):  N/a  IV Started by: Azucena Cecil, RN  Height: 5' 1.5" (1.562 m)  Cup Size: C  BMI:  Body mass index is 26.59 kg/(m^2). Weight:  143 lb (64.864 kg)   Tech Comments:  n/a    Nuclear Med Study 1 or 2 day study: 1 day  Stress Test Type:  [place]  Order Authorizing Provider:  Shelva Majestic, MD   Resting Radionuclide: Technetium 52m Sestamibi  Resting Radionuclide Dose: 10.2 mCi   Stress Radionuclide:  Technetium 72m Sestamibi  Stress Radionuclide Dose: 31.2 mCi           Stress Protocol Rest HR: 60 Stress HR: 69  Rest BP: 152/93 Stress BP: 147/97  Exercise Time (min): n/a METS: n/a   Predicted Max HR: 163 bpm % Max HR: 46.63 bpm Rate Pressure Product: 11552  Dose of Adenosine (mg):  n/a Dose of Lexiscan: 0.4 mg  Dose of Atropine (mg): n/a Dose of Dobutamine: n/a mcg/kg/min (at max HR)  Stress Test Technologist: Leane Para, CCT Nuclear Technologist: Otho Perl, CNMT   Rest Procedure:  Myocardial perfusion imaging was performed at rest 45 minutes following the intravenous administration of Technetium 51m Sestamibi. Stress Procedure:  The patient received IV Lexiscan 0.4 mg over 15-seconds.  Technetium 66m Sestamibi injected at 30-seconds.  The patient experienced SOB and chestp pain; 75 mg of IV Aminophylline was administered with  resolution of symptoms.  There were no significant changes with Lexiscan.  Quantitative spect images were obtained after a 45 minute delay.  Transient Ischemic Dilatation (Normal <1.22):  0.93 Lung/Heart Ratio (Normal <0.45):  0.26 QGS EDV:  63 ml QGS ESV:  14 ml LV Ejection Fraction: 78%  Rest ECG: NSR - Normal EKG  Stress ECG: No significant change from baseline ECG  QPS Raw Data Images:  Significant liver uptake Stress Images:  Normal homogeneous uptake in all areas of the myocardium. Rest Images:  Normal homogeneous uptake in all areas of the myocardium. Subtraction (SDS):  No evidence of ischemia.  Impression Exercise Capacity:  Lexiscan with no exercise. BP Response:  Normal blood pressure response. Clinical Symptoms:  8/10 chest tightness with lexiscan (she also has acute bronchitis) - breathing improved with chest tightness quickly ECG Impression:  No significant ECG changes with Lexiscan. Comparison with Prior Nuclear Study: No images to compare  Overall Impression:  Low risk stress nuclear study with no reversible ischemia..  LV Wall Motion:  NL LV Function; NL Wall Motion; EF 78%.  Pixie Casino, MD, [facility] Board Certified in Nuclear Cardiology Attending Cardiologist [place], MD  08/17/2013 12:35 PM

## 2013-08-17 NOTE — Progress Notes (Signed)
Patient ID: [name] [name], female   DOB: [birth year], 58 y.o.   MRN: [id number]     HPI: [name] [name] is a 58 y.o. female who presents to the office for cardiology evaluation. I last saw her in October 2012.  [name] [name] has known coronary artery disease in 2001 underwent initial stenting of her right coronary artery but Dr. Gwenlyn Found with placement of bare-metal stent. The following day, I perform staging intervention to proximal and mid LAD, as well as [place] arthrotomy to the ostium of her diagonal vessel. 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 She continues to have bare-metal stents in the LAD and diagonal system. She has a history of hypertension, hyperlipidemia, as well as hypothyroidism.  In September 2013 she saw Faythe Casa. At that time, she was noticing some claudication symptoms and also was experiencing some lower extremity edema.  She has a history of depression and anxiety and is followed at the [facility] mental health. Apparently she was evaluated on 07/09/2013 after experiencing a one-week history of increasing depression, anxiety, tearfulness and suicidal at a ration. She had been drinking to excess and also had used crack cocaine. She also had been hearing voices.  She presents to the office today experiencing episodes of chest discomfort. These often are nonexertional. She is still smoking. She was hospitalized at behavioral health for one week and now is in outpatient treatment. Because of her episodes of chest pain she presents for cardiology evaluation.  Past Medical History  Diagnosis Date  . Thyroid disease   . Coronary artery disease   . Hernia   . Chronic back pain     herniated nucleus pulposus  . Hyperlipidemia     takes Lipitor daily  . Hypertension     takes Losartan daily and Labetalol bid  . Heart attack 2011  . Asthma   .  COPD (chronic obstructive pulmonary disease)     early stages  . Shortness of breath     with exertion  . Bronchitis     couple of months ago  . Arthritis   . Joint pain   . Neck pain   . Bruises easily     pt is on Effient  . Psoriasis     elbows,knees,back  . H/O hiatal hernia   . GERD (gastroesophageal reflux disease)     takes Nexium daily  . Hemorrhoids   . Diverticulosis   . Slowing of urinary stream   . Hypothyroidism     takes Synthroid daily  . Depression     takes Klonopin daily  . Anxiety     takes Lexapro daily  . Insomnia     hydroxyzine prn  . Bartholin gland cyst 08/29/2011    Past Surgical History  Procedure Laterality Date  . Abdominal hysterectomy  1977  . Laparoscopy  1977    exploratory  . Tonsillectomy      as a child  . Coronary angioplasty with stent placement  2011    x 2  . Colonoscopy    . Esophagogastroduodenscoy    . Lumbar laminectomy/decompression microdiscectomy  08/05/2011    Procedure: LUMBAR LAMINECTOMY/DECOMPRESSION MICRODISCECTOMY;  Surgeon: Otilio Connors, MD;  Location: [facility];  Service: Neurosurgery;  Laterality: Right;  Right Lumbar four-five extraforaminal discectomy    Allergies  Allergen Reactions  . Other Anaphylaxis    avacdos  .  Codeine Nausea Only  . Latex     REACTION: shortness of breath and rash    Current Outpatient Prescriptions  Medication Sig Dispense Refill  . albuterol (PROVENTIL HFA;VENTOLIN HFA) 108 (90 BASE) MCG/ACT inhaler Inhale 2 puffs into the lungs every 6 (six) hours as needed. For shortess of breath  1 Inhaler  3  . aspirin 325 MG tablet Take 1 tablet (325 mg total) by mouth daily.      Marland Kitchen atorvastatin (LIPITOR) 40 MG tablet Take 1 tablet by mouth daily. <Please make appointment for future refills>  90 tablet  3  . Calcium 600 MG tablet   60 tablet    . escitalopram (LEXAPRO) 10 MG tablet Take 1 tablet (10 mg total) by mouth daily.  30 tablet  0  . esomeprazole (NEXIUM) 40 MG capsule Take  1 capsule (40 mg total) by mouth daily.  30 capsule  3  . fluticasone (FLONASE) 50 MCG/ACT nasal spray Place 2 sprays into both nostrils at bedtime.  16 g  3  . Fluticasone-Salmeterol (ADVAIR) 100-50 MCG/DOSE AEPB Inhale 1 puff into the lungs 2 (two) times daily.  1 each  3  . furosemide (LASIX) 20 MG tablet Take 1 tablet (20 mg total) by mouth daily.  30 tablet  3  . gabapentin (NEURONTIN) 600 MG tablet Take 1 tablet (600 mg total) by mouth 4 (four) times daily.  120 tablet  0  . labetalol (NORMODYNE) 100 MG tablet Take 1 tablet (100 mg total) by mouth 2 (two) times daily.  180 tablet  3  . levothyroxine (SYNTHROID, LEVOTHROID) 100 MCG tablet Take 1 tablet (100 mcg total) by mouth daily.  90 tablet  3  . losartan (COZAAR) 50 MG tablet Take 1 tablet (50 mg total) by mouth daily.  90 tablet  3  . montelukast (SINGULAIR) 10 MG tablet Take 1 tablet (10 mg total) by mouth at bedtime.  30 tablet  3  . risperiDONE (RISPERDAL) 1 MG tablet Take 1 mg by mouth at bedtime.      . prasugrel (EFFIENT) 10 MG TABS tablet Take 1 tablet (10 mg total) by mouth daily.  30 tablet     No current facility-administered medications for this visit.    History   Social History  . Marital Status: Single    Spouse Name: N/A    Number of Children: N/A  . Years of Education: N/A   Occupational History  . Not on file.   Social History Main Topics  . Smoking status: Current Every Day Smoker -- 1.00 packs/day for 44 years    Types: Cigarettes  . Smokeless tobacco: Not on file  . Alcohol Use: Yes     Comment: alcohol last night  . Drug Use: Yes    Special: Cocaine     Comment: crack cocaine last night  . Sexual Activity: Not Currently    Birth Control/ Protection: Surgical   Other Topics Concern  . Not on file   Social History Narrative  . No narrative on file    Family History  Problem Relation Age of Onset  . Hypotension Neg Hx   . Malignant hyperthermia Neg Hx   . Pseudochol deficiency Neg Hx   .  Anesthesia problems Daughter   . Heart disease Mother   . Hypertension Mother   . Stroke Mother   . Heart disease Father   . Hypertension Father   . Diabetes Father   . Cancer Maternal Aunt  breast  . Thyroid disease Paternal Aunt   . Heart disease Maternal Grandfather   . Cancer Paternal Grandmother     mouth    ROS is negative for fevers, chills or night sweats. She denies headaches. She denies visual symptoms. There is no change in hearing. There is no wheezing. She denies presyncope or syncope. She takes Advair. She does note some chest wall discomfort. There is a mild shortness of breath. She denies nausea or vomiting. She is unaware blood in stool or urine. She denies significant swelling. She does have peripheral neuropathy. She does have hyperlipidemia. There is no diabetes. There is a history of thyroid abnormality on Synthroid replacement.  Other comprehensive 14 point system review is negative.  PE BP 130/88  Pulse 60  Ht 5' 1.5" (1.562 m)  Wt 143 lb 9.6 oz (65.137 kg)  BMI 26.70 kg/m2  General: Alert, oriented, no distress.  Skin: normal turgor, no rashes HEENT: Normocephalic, atraumatic. Pupils round and reactive; sclera anicteric;no lid lag. Extraocular muscles intact. No xanthelasmas Nose without nasal septal hypertrophy Mouth/Parynx benign; Mallinpatti scale 3 Neck: No JVD, no carotid bruits; normal carotid upstroke Lungs: clear to ausculatation and percussion; no wheezing or rales Chest wall: Mild tenderness to palpitation Heart: RRR, s1 s2 normal 1/6 systolic murmur; no S3 or S4 gallop. No rubs thrills or heaves. Abdomen: soft, nontender; no hepatosplenomehaly, BS+; abdominal aorta nontender and not dilated by palpation. Back: no CVA tenderness Pulses 2+ Extremities: no clubbing cyanosis or edema, Homan's sign negative  Neurologic: grossly nonfocal; cranial nerves grossly normal. Psychologic: normal affect and mood; no overt depression  ECG (independently  read by me): Sinus rhythm at 60 beats per minute. No significant ST changes  LABS:  BMET    Component Value Date/Time   NA 140 07/09/2013 1455   K 4.3 07/09/2013 1455   CL 102 07/09/2013 1455   CO2 29 07/09/2013 1455   GLUCOSE 96 07/09/2013 1455   BUN 9 07/09/2013 1455   CREATININE 0.86 07/09/2013 1455   CALCIUM 9.4 07/09/2013 1455   GFRNONAA 74* 07/09/2013 1455   GFRAA 85* 07/09/2013 1455     Hepatic Function Panel     Component Value Date/Time   PROT 6.7 03/28/2010 1559   ALBUMIN 4.1 03/28/2010 1559   AST 17 03/28/2010 1559   ALT 17 03/28/2010 1559   ALKPHOS 73 03/28/2010 1559   BILITOT 0.3 03/28/2010 1559     CBC    Component Value Date/Time   WBC 7.5 07/09/2013 1455   RBC 4.50 07/09/2013 1455   HGB 15.0 07/09/2013 1455   HCT 44.2 07/09/2013 1455   PLT 338 07/09/2013 1455   MCV 98.2 07/09/2013 1455   MCH 33.3 07/09/2013 1455   MCHC 33.9 07/09/2013 1455   RDW 13.0 07/09/2013 1455   LYMPHSABS 3.1 04/16/2012 1746   MONOABS 0.8 04/16/2012 1746   EOSABS 0.1 04/16/2012 1746   BASOSABS 0.0 04/16/2012 1746     BNP No results found for this basename: probnp    Lipid Panel     Component Value Date/Time   CHOL 158 06/07/2013 1204   TRIG 152* 06/07/2013 1204   HDL 50 06/07/2013 1204   CHOLHDL 3.2 06/07/2013 1204   VLDL 30 06/07/2013 1204   LDLCALC 78 06/07/2013 1204     RADIOLOGY: No results found.    ASSESSMENT AND PLAN: [name] [name] is a 58 year old female who has a history of hypertension, hyperlipidemia, documented coronary artery disease with interventions to her right  carotid artery, as well as proximal to mid LAD. She has continued to smoke cigarettes. She also has had recent use of crack cocaine she does have probable chest wall tenderness. However, with her cardiac history, I do feel further evaluation of her chest pain is indicated. Her last nuclear perfusion study was 2-1/2 years ago. I will schedule her for a pharmacologic LexiScan Myoview study.  We discussed the importance of smoking cessation. Her blood pressure today was controlled on her current therapy with losartan 50 mg, labetalol 100 mg twice a day. She's not having significant GERD symptoms on her current dose of Nexium. There is no wheezing. I have recommended she reduce her aspirin from 325 to 81 milligrams. She is on effient for dual antiplatelet therapy. If this were to be discontinued and changed back to Plavix we will need to assess P2Y12 testing to assess Plavix responsiveness.  I will see her back in the office in followup of the above studies and further recommendations we may at that time.     Troy Sine, MD, [facility]  08/17/2013 3:16 PM

## 2013-08-19 ENCOUNTER — Encounter (HOSPITAL_COMMUNITY): Payer: Self-pay | Admitting: Emergency Medicine

## 2013-08-19 ENCOUNTER — Emergency Department (HOSPITAL_COMMUNITY)
Admission: EM | Admit: 2013-08-19 | Discharge: 2013-08-19 | Disposition: A | Discharge status: Home / Self Care | Payer: No Typology Code available for payment source | Source: Home / Self Care | Attending: Family Medicine | Admitting: Family Medicine

## 2013-08-19 DIAGNOSIS — J441 Chronic obstructive pulmonary disease with (acute) exacerbation: Secondary | ICD-10-CM

## 2013-08-19 MED ORDER — AMOXICILLIN 500 MG PO CAPS: 500.0000 mg | ORAL_CAPSULE | Freq: Three times a day (TID) | ORAL | Status: DC

## 2013-08-19 MED ORDER — PREDNISONE 10 MG PO KIT: PACK | ORAL | Status: DC

## 2013-08-19 MED ORDER — IPRATROPIUM BROMIDE 0.02 % IN SOLN
0.5000 mg | RESPIRATORY_TRACT | Status: DC | PRN
Start: 2013-08-19 — End: 2015-10-30

## 2013-08-19 MED ORDER — TRAMADOL HCL 50 MG PO TABS: 50.0000 mg | ORAL_TABLET | Freq: Two times a day (BID) | ORAL | Status: DC | PRN

## 2013-08-19 NOTE — Discharge Instructions (Signed)
Thank you for coming in today. Use albuterol and Atrovent every 6 hours as needed for wheezing. Take prednisone daily for 12 days. Use amoxicillin for 10 days. Use tramadol for severe coughing. Call or go to the emergency room if you get worse, have trouble breathing, have chest pains, or palpitations.  Congratulations on going to Alcoholics Anonymous. It is hard work but I am sure you can do it. Keep thinking about cutting back or quitting smoking.

## 2013-08-19 NOTE — ED Notes (Signed)
C/o sinus problems States she has a headache, nasal drip, and cough with yellowish mucous  States used inhaler, neb tx and cough drops but no relief.

## 2013-08-19 NOTE — ED Provider Notes (Signed)
[name] [name] is a 58 y.o. female who presents to Urgent Care today for cough. Patient notes 4 days of worsening productive cough with sinus congestion runny nose and headache. Patient is wheezing and mild shortness of breath. She has been using her albuterol nebulizer and inhaler which help a little. She denies any fevers nausea vomiting or diarrhea. She does note some mild chills.   Past Medical History  Diagnosis Date  . Thyroid disease   . Coronary artery disease   . Hernia   . Chronic back pain     herniated nucleus pulposus  . Hyperlipidemia     takes Lipitor daily  . Hypertension     takes Losartan daily and Labetalol bid  . Heart attack 2011  . Asthma   . COPD (chronic obstructive pulmonary disease)     early stages  . Shortness of breath     with exertion  . Bronchitis     couple of months ago  . Arthritis   . Joint pain   . Neck pain   . Bruises easily     pt is on Effient  . Psoriasis     elbows,knees,back  . H/O hiatal hernia   . GERD (gastroesophageal reflux disease)     takes Nexium daily  . Hemorrhoids   . Diverticulosis   . Slowing of urinary stream   . Hypothyroidism     takes Synthroid daily  . Depression     takes Klonopin daily  . Anxiety     takes Lexapro daily  . Insomnia     hydroxyzine prn  . Bartholin gland cyst 08/29/2011   History  Substance Use Topics  . Smoking status: Current Every Day Smoker -- 1.00 packs/day for 44 years    Types: Cigarettes  . Smokeless tobacco: Not on file  . Alcohol Use: Yes     Comment: alcohol last night   ROS as above Medications: No current facility-administered medications for this encounter.   Current Outpatient Prescriptions  Medication Sig Dispense Refill  . albuterol (PROVENTIL HFA;VENTOLIN HFA) 108 (90 BASE) MCG/ACT inhaler Inhale 2 puffs into the lungs every 6 (six) hours as needed. For shortess of breath  1 Inhaler  3  . amoxicillin (AMOXIL) 500 MG capsule Take 1 capsule (500 mg total) by mouth  3 (three) times daily.  30 capsule  0  . aspirin 325 MG tablet Take 1 tablet (325 mg total) by mouth daily.      Marland Kitchen atorvastatin (LIPITOR) 40 MG tablet Take 1 tablet by mouth daily. <Please make appointment for future refills>  90 tablet  3  . Calcium 600 MG tablet   60 tablet    . escitalopram (LEXAPRO) 10 MG tablet Take 1 tablet (10 mg total) by mouth daily.  30 tablet  0  . esomeprazole (NEXIUM) 40 MG capsule Take 1 capsule (40 mg total) by mouth daily.  30 capsule  3  . fluticasone (FLONASE) 50 MCG/ACT nasal spray Place 2 sprays into both nostrils at bedtime.  16 g  3  . Fluticasone-Salmeterol (ADVAIR) 100-50 MCG/DOSE AEPB Inhale 1 puff into the lungs 2 (two) times daily.  1 each  3  . furosemide (LASIX) 20 MG tablet Take 1 tablet (20 mg total) by mouth daily.  30 tablet  3  . gabapentin (NEURONTIN) 600 MG tablet Take 1 tablet (600 mg total) by mouth 4 (four) times daily.  120 tablet  0  . ipratropium (ATROVENT) 0.02 % nebulizer solution  Take 2.5 mLs (0.5 mg total) by nebulization every 4 (four) hours as needed for wheezing or shortness of breath.  75 mL  12  . labetalol (NORMODYNE) 100 MG tablet Take 1 tablet (100 mg total) by mouth 2 (two) times daily.  180 tablet  3  . levothyroxine (SYNTHROID, LEVOTHROID) 100 MCG tablet Take 1 tablet (100 mcg total) by mouth daily.  90 tablet  3  . losartan (COZAAR) 50 MG tablet Take 1 tablet (50 mg total) by mouth daily.  90 tablet  3  . montelukast (SINGULAIR) 10 MG tablet Take 1 tablet (10 mg total) by mouth at bedtime.  30 tablet  3  . prasugrel (EFFIENT) 10 MG TABS tablet Take 1 tablet (10 mg total) by mouth daily.  30 tablet    . PredniSONE 10 MG KIT 12 day dose pack po  1 kit  0  . risperiDONE (RISPERDAL) 1 MG tablet Take 1 mg by mouth at bedtime.      . traMADol (ULTRAM) 50 MG tablet Take 1 tablet (50 mg total) by mouth every 12 (twelve) hours as needed (cough).  10 tablet  0    Exam:  BP 156/78  Pulse 80  Temp(Src) 98.3 F (36.8 C) (Oral)   Resp 20  SpO2 97% Gen: Well NAD HEENT: EOMI,  MMM posterior pharynx with cobblestoning. Tympanic membranes are normal appearing bilaterally. Lungs: Normal work of breathing. Prolonged expiratory phase and wheezing bilaterally. Heart: RRR no MRG Abd: NABS, Soft. NT, ND Exts: Brisk capillary refill, warm and well perfused.    Assessment and Plan: 58 y.o. female with COPD exacerbation. Plan to treat with prednisone, amoxicillin, albuterol and Atrovent, and tramadol for cough.  Recommend followup with primary care provider. Encourage smoking cessation.  Discussed warning signs or symptoms. Please see discharge instructions. Patient expresses understanding.    Gregor Hams, MD 08/19/13 [phone]

## 2013-09-07 ENCOUNTER — Telehealth: Payer: Self-pay | Admitting: Emergency Medicine

## 2013-09-07 ENCOUNTER — Ambulatory Visit: Payer: Self-pay | Admitting: Internal Medicine

## 2013-09-07 ENCOUNTER — Telehealth: Payer: Self-pay | Admitting: Internal Medicine

## 2013-09-07 NOTE — Telephone Encounter (Signed)
Pt called in requesting refills on all medications. States her appt was cancelled today due to weather. I will route message to Dr. Doreene Burke

## 2013-09-07 NOTE — Telephone Encounter (Signed)
Pt called regarding a refill of her medication, please contact pt as soon as possible °

## 2013-09-08 ENCOUNTER — Telehealth: Payer: Self-pay | Admitting: *Deleted

## 2013-09-08 MED ORDER — PRASUGREL HCL 10 MG PO TABS: 10.0000 mg | ORAL_TABLET | Freq: Every day | ORAL | Status: DC

## 2013-09-08 NOTE — Telephone Encounter (Signed)
Called and informed patient of nuc results. Patient requested samples of effient. 4 bottles left at front desk for patient to pick up.

## 2013-09-08 NOTE — Telephone Encounter (Signed)
Message copied by Lauralee Evener on Wed Sep 08, 2013 12:34 PM ------      Message from: Shelva Majestic A      Created: Wed Sep 01, 2013 10:20 AM       Nun wnl ------

## 2013-09-30 ENCOUNTER — Telehealth: Payer: Self-pay | Admitting: Internal Medicine

## 2013-09-30 NOTE — Telephone Encounter (Signed)
Pt requesting medication refill Tramadol. Please f/u

## 2013-09-30 NOTE — Telephone Encounter (Signed)
Patient would like her ultram refilled.

## 2013-10-05 ENCOUNTER — Ambulatory Visit: Payer: Self-pay

## 2013-10-26 ENCOUNTER — Telehealth: Payer: Self-pay | Admitting: Cardiovascular Disease

## 2013-10-26 NOTE — Telephone Encounter (Signed)
Calling to see if her Effient is ready for her to pick up .. .. If the medication is not here can she get some samples .Marland Kitchen Please call

## 2013-10-26 NOTE — Telephone Encounter (Signed)
Returned call and pt verified x 2.   Pt informed message received and Erasmo Downer, PharmD is at a different location today and unable to confirm if medication received.  Pt informed a 2-week supply of samples will be left at the front desk for pick up.  Pt also informed there is no record of Erasmo Downer calling, which she usually does when the medication is received.  Pt verbalized understanding and agreed w/ plan.  Message forwarded to Tommy Medal, PharmD.

## 2013-10-27 NOTE — Telephone Encounter (Signed)
Refaxed paperwork to [employer] this am - had forgot to mark a box and form was sent back to [place] yesterday.  Told pt I would let her know as soon as we hear something.

## 2013-11-04 ENCOUNTER — Other Ambulatory Visit: Payer: Self-pay | Admitting: Internal Medicine

## 2013-11-10 ENCOUNTER — Other Ambulatory Visit: Payer: Self-pay | Admitting: Internal Medicine

## 2013-11-23 ENCOUNTER — Telehealth: Payer: Self-pay | Admitting: Cardiovascular Disease

## 2013-11-23 MED ORDER — PRASUGREL HCL 10 MG PO TABS: 10.0000 mg | ORAL_TABLET | Freq: Every day | ORAL | Status: DC

## 2013-11-23 NOTE — Telephone Encounter (Signed)
Would like some samples of Effient 10 mg please.She is completely out of her medicine.

## 2013-11-23 NOTE — Telephone Encounter (Signed)
Returned call to patient. She reports she has been out of her Effient x1 week. Patient did not call to ask for samples last week. Patient states she feels weak, today was nauseous, and has "felt like chest was going to cave in" and her chest felt tight all day yesterday (is better today). RN has provided with samples, which patient stated she will pick up today.   Will forward this message to Erasmo Downer, who was working on [employer], Dr. Claiborne Billings, and [place], [place] (in office today, since both Dr. Claiborne Billings and Erasmo Downer are not available) to review chart and complaints.

## 2013-11-23 NOTE — Telephone Encounter (Signed)
Please call-concerning her medicine,she really need it.

## 2013-11-24 NOTE — Telephone Encounter (Signed)
Effient approved for 12 month period from 11/05/13 from [place].  Advised pt we will call her as soon as it arrives.

## 2013-11-24 NOTE — Telephone Encounter (Signed)
agree

## 2013-11-25 ENCOUNTER — Other Ambulatory Visit: Payer: Self-pay | Admitting: Internal Medicine

## 2013-11-25 ENCOUNTER — Ambulatory Visit: Payer: Self-pay

## 2013-11-25 DIAGNOSIS — K219 Gastro-esophageal reflux disease without esophagitis: Secondary | ICD-10-CM

## 2013-12-23 ENCOUNTER — Ambulatory Visit: Payer: No Typology Code available for payment source | Attending: Internal Medicine

## 2013-12-29 ENCOUNTER — Other Ambulatory Visit: Payer: Self-pay | Admitting: Internal Medicine

## 2013-12-29 DIAGNOSIS — J449 Chronic obstructive pulmonary disease, unspecified: Secondary | ICD-10-CM

## 2013-12-29 DIAGNOSIS — J439 Emphysema, unspecified: Secondary | ICD-10-CM

## 2013-12-29 MED ORDER — ALBUTEROL SULFATE HFA 108 (90 BASE) MCG/ACT IN AERS: 2.0000 | INHALATION_SPRAY | Freq: Four times a day (QID) | RESPIRATORY_TRACT | Status: DC | PRN

## 2013-12-29 MED ORDER — FLUTICASONE-SALMETEROL 100-50 MCG/DOSE IN AEPB: 1.0000 | INHALATION_SPRAY | Freq: Two times a day (BID) | RESPIRATORY_TRACT | Status: DC

## 2014-01-07 ENCOUNTER — Ambulatory Visit: Payer: No Typology Code available for payment source | Attending: Internal Medicine | Admitting: Internal Medicine

## 2014-01-07 ENCOUNTER — Encounter: Payer: Self-pay | Admitting: Internal Medicine

## 2014-01-07 VITALS — BP 126/76 | HR 60 | Temp 98.1°F | Resp 16 | Ht 61.0 in | Wt 138.0 lb

## 2014-01-07 DIAGNOSIS — J329 Chronic sinusitis, unspecified: Secondary | ICD-10-CM | POA: Insufficient documentation

## 2014-01-07 DIAGNOSIS — I10 Essential (primary) hypertension: Secondary | ICD-10-CM | POA: Insufficient documentation

## 2014-01-07 DIAGNOSIS — F17200 Nicotine dependence, unspecified, uncomplicated: Secondary | ICD-10-CM | POA: Insufficient documentation

## 2014-01-07 DIAGNOSIS — Z9861 Coronary angioplasty status: Secondary | ICD-10-CM

## 2014-01-07 DIAGNOSIS — J449 Chronic obstructive pulmonary disease, unspecified: Secondary | ICD-10-CM | POA: Insufficient documentation

## 2014-01-07 DIAGNOSIS — E785 Hyperlipidemia, unspecified: Secondary | ICD-10-CM

## 2014-01-07 DIAGNOSIS — G8929 Other chronic pain: Secondary | ICD-10-CM | POA: Insufficient documentation

## 2014-01-07 DIAGNOSIS — I2510 Atherosclerotic heart disease of native coronary artery without angina pectoris: Secondary | ICD-10-CM | POA: Insufficient documentation

## 2014-01-07 DIAGNOSIS — M549 Dorsalgia, unspecified: Secondary | ICD-10-CM | POA: Insufficient documentation

## 2014-01-07 DIAGNOSIS — F1010 Alcohol abuse, uncomplicated: Secondary | ICD-10-CM | POA: Insufficient documentation

## 2014-01-07 DIAGNOSIS — J4489 Other specified chronic obstructive pulmonary disease: Secondary | ICD-10-CM | POA: Insufficient documentation

## 2014-01-07 MED ORDER — ATORVASTATIN CALCIUM 40 MG PO TABS: ORAL_TABLET | ORAL | Status: DC

## 2014-01-07 MED ORDER — CYCLOBENZAPRINE HCL 10 MG PO TABS: 10.0000 mg | ORAL_TABLET | Freq: Three times a day (TID) | ORAL | Status: DC | PRN

## 2014-01-07 MED ORDER — TRAMADOL HCL 50 MG PO TABS: 50.0000 mg | ORAL_TABLET | Freq: Two times a day (BID) | ORAL | Status: DC | PRN

## 2014-01-07 NOTE — Patient Instructions (Signed)
Hypertension Hypertension, commonly called high blood pressure, is when the force of blood pumping through your arteries is too strong. Your arteries are the blood vessels that carry blood from your heart throughout your body. A blood pressure reading consists of a higher number over a lower number, such as 110/72. The higher number (systolic) is the pressure inside your arteries when your heart pumps. The lower number (diastolic) is the pressure inside your arteries when your heart relaxes. Ideally you want your blood pressure below 120/80. Hypertension forces your heart to work harder to pump blood. Your arteries may become narrow or stiff. Having hypertension puts you at risk for heart disease, stroke, and other problems.  RISK FACTORS Some risk factors for high blood pressure are controllable. Others are not.  Risk factors you cannot control include:   Race. You may be at higher risk if you are African American.  Age. Risk increases with age.  Gender. Men are at higher risk than women before age 45 years. After age 65, women are at higher risk than men. Risk factors you can control include:  Not getting enough exercise or physical activity.  Being overweight.  Getting too much fat, sugar, calories, or salt in your diet.  Drinking too much alcohol. SIGNS AND SYMPTOMS Hypertension does not usually cause signs or symptoms. Extremely high blood pressure (hypertensive crisis) may cause headache, anxiety, shortness of breath, and nosebleed. DIAGNOSIS  To check if you have hypertension, your health care Enda Santo will measure your blood pressure while you are seated, with your arm held at the level of your heart. It should be measured at least twice using the same arm. Certain conditions can cause a difference in blood pressure between your right and left arms. A blood pressure reading that is higher than normal on one occasion does not mean that you need treatment. If one blood pressure reading  is high, ask your health care Ilean Spradlin about having it checked again. TREATMENT  Treating high blood pressure includes making lifestyle changes and possibly taking medication. Living a healthy lifestyle can help lower high blood pressure. You may need to change some of your habits. Lifestyle changes may include:  Following the DASH diet. This diet is high in fruits, vegetables, and whole grains. It is low in salt, red meat, and added sugars.  Getting at least 2 1/2 hours of brisk physical activity every week.  Losing weight if necessary.  Not smoking.  Limiting alcoholic beverages.  Learning ways to reduce stress. If lifestyle changes are not enough to get your blood pressure under control, your health care Winta Barcelo may prescribe medicine. You may need to take more than one. Work closely with your health care Coolidge Gossard to understand the risks and benefits. HOME CARE INSTRUCTIONS  Have your blood pressure rechecked as directed by your health care Laquan Ludden.   Only take medicine as directed by your health care Kareemah Grounds. Follow the directions carefully. Blood pressure medicines must be taken as prescribed. The medicine does not work as well when you skip doses. Skipping doses also puts you at risk for problems.   Do not smoke.   Monitor your blood pressure at home as directed by your health care Patina Spanier. SEEK MEDICAL CARE IF:   You think you are having a reaction to medicines taken.  You have recurrent headaches or feel dizzy.  You have swelling in your ankles.  You have trouble with your vision. SEEK IMMEDIATE MEDICAL CARE IF:  You develop a severe headache or   confusion.  You have unusual weakness, numbness, or feel faint.  You have severe chest or abdominal pain.  You vomit repeatedly.  You have trouble breathing. MAKE SURE YOU:   Understand these instructions.  Will watch your condition.  Will get help right away if you are not doing well or get  worse. Document Released: 07/01/2005 Document Revised: 07/06/2013 Document Reviewed: 04/23/2013 [employer] Patient Information 2015 [employer]. This information is not intended to replace advice given to you by your health care Salbador Fiveash. Make sure you discuss any questions you have with your health care Avaline Stillson. DASH Eating Plan DASH stands for "Dietary Approaches to Stop Hypertension." The DASH eating plan is a healthy eating plan that has been shown to reduce high blood pressure (hypertension). Additional health benefits may include reducing the risk of type 2 diabetes mellitus, heart disease, and stroke. The DASH eating plan may also help with weight loss. WHAT DO I NEED TO KNOW ABOUT THE DASH EATING PLAN? For the DASH eating plan, you will follow these general guidelines:  Choose foods with a percent daily value for sodium of less than 5% (as listed on the food label).  Use salt-free seasonings or herbs instead of table salt or sea salt.  Check with your health care Jatin Naumann or pharmacist before using salt substitutes.  Eat lower-sodium products, often labeled as "lower sodium" or "no salt added."  Eat fresh foods.  Eat more vegetables, fruits, and low-fat dairy products.  Choose whole grains. Look for the word "whole" as the first word in the ingredient list.  Choose fish and skinless chicken or turkey more often than red meat. Limit fish, poultry, and meat to 6 oz (170 g) each day.  Limit sweets, desserts, sugars, and sugary drinks.  Choose heart-healthy fats.  Limit cheese to 1 oz (28 g) per day.  Eat more home-cooked food and less restaurant, buffet, and fast food.  Limit fried foods.  Cook foods using methods other than frying.  Limit canned vegetables. If you do use them, rinse them well to decrease the sodium.  When eating at a restaurant, ask that your food be prepared with less salt, or no salt if possible. WHAT FOODS CAN I EAT? Seek help from a dietitian for  individual calorie needs. Grains Whole grain or whole wheat bread. Brown rice. Whole grain or whole wheat pasta. Quinoa, bulgur, and whole grain cereals. Low-sodium cereals. Corn or whole wheat flour tortillas. Whole grain cornbread. Whole grain crackers. Low-sodium crackers. Vegetables Fresh or frozen vegetables (raw, steamed, roasted, or grilled). Low-sodium or reduced-sodium tomato and vegetable juices. Low-sodium or reduced-sodium tomato sauce and paste. Low-sodium or reduced-sodium canned vegetables.  Fruits All fresh, canned (in natural juice), or frozen fruits. Meat and Other Protein Products Ground beef (85% or leaner), grass-fed beef, or beef trimmed of fat. Skinless chicken or turkey. Ground chicken or turkey. Pork trimmed of fat. All fish and seafood. Eggs. Dried beans, peas, or lentils. Unsalted nuts and seeds. Unsalted canned beans. Dairy Low-fat dairy products, such as skim or 1% milk, 2% or reduced-fat cheeses, low-fat ricotta or cottage cheese, or plain low-fat yogurt. Low-sodium or reduced-sodium cheeses. Fats and Oils Tub margarines without trans fats. Light or reduced-fat mayonnaise and salad dressings (reduced sodium). Avocado. Safflower, olive, or canola oils. Natural peanut or almond butter. Other Unsalted popcorn and pretzels. The items listed above may not be a complete list of recommended foods or beverages. Contact your dietitian for more options. WHAT FOODS ARE NOT RECOMMENDED? Grains   White bread. White pasta. White rice. Refined cornbread. Bagels and croissants. Crackers that contain trans fat. Vegetables Creamed or fried vegetables. Vegetables in a cheese sauce. Regular canned vegetables. Regular canned tomato sauce and paste. Regular tomato and vegetable juices. Fruits Dried fruits. Canned fruit in light or heavy syrup. Fruit juice. Meat and Other Protein Products Fatty cuts of meat. Ribs, chicken wings, bacon, sausage, bologna, salami, chitterlings, fatback, hot  dogs, bratwurst, and packaged luncheon meats. Salted nuts and seeds. Canned beans with salt. Dairy Whole or 2% milk, cream, half-and-half, and cream cheese. Whole-fat or sweetened yogurt. Full-fat cheeses or blue cheese. Nondairy creamers and whipped toppings. Processed cheese, cheese spreads, or cheese curds. Condiments Onion and garlic salt, seasoned salt, table salt, and sea salt. Canned and packaged gravies. Worcestershire sauce. Tartar sauce. Barbecue sauce. Teriyaki sauce. Soy sauce, including reduced sodium. Steak sauce. Fish sauce. Oyster sauce. Cocktail sauce. Horseradish. Ketchup and mustard. Meat flavorings and tenderizers. Bouillon cubes. Hot sauce. Tabasco sauce. Marinades. Taco seasonings. Relishes. Fats and Oils Butter, stick margarine, lard, shortening, ghee, and bacon fat. Coconut, palm kernel, or palm oils. Regular salad dressings. Other Pickles and olives. Salted popcorn and pretzels. The items listed above may not be a complete list of foods and beverages to avoid. Contact your dietitian for more information. WHERE CAN I FIND MORE INFORMATION? National Heart, Lung, and Blood Institute: travelstabloid.com Document Released: 06/20/2011 Document Revised: 07/06/2013 Document Reviewed: 05/05/2013 [facility] Patient Information 2015 [place], [place]. This information is not intended to replace advice given to you by your health care Andras Grunewald. Make sure you discuss any questions you have with your health care Yahia Bottger. Back Pain, Adult Low back pain is very common. About 1 in 5 people have back pain.The cause of low back pain is rarely dangerous. The pain often gets better over time.About half of people with a sudden onset of back pain feel better in just 2 weeks. About 8 in 10 people feel better by 6 weeks.  CAUSES Some common causes of back pain include:  Strain of the muscles or ligaments supporting the spine.  Wear and tear (degeneration) of the  spinal discs.  Arthritis.  Direct injury to the back. DIAGNOSIS Most of the time, the direct cause of low back pain is not known.However, back pain can be treated effectively even when the exact cause of the pain is unknown.Answering your caregiver's questions about your overall health and symptoms is one of the most accurate ways to make sure the cause of your pain is not dangerous. If your caregiver needs more information, he or she may order lab work or imaging tests (X-rays or MRIs).However, even if imaging tests show changes in your back, this usually does not require surgery. HOME CARE INSTRUCTIONS For many people, back pain returns.Since low back pain is rarely dangerous, it is often a condition that people can learn to [facility] their own.   Remain active. It is stressful on the back to sit or stand in one place. Do not sit, drive, or stand in one place for more than 30 minutes at a time. Take short walks on level surfaces as soon as pain allows.Try to increase the length of time you walk each day.  Do not stay in bed.Resting more than 1 or 2 days can delay your recovery.  Do not avoid exercise or work.Your body is made to move.It is not dangerous to be active, even though your back may hurt.Your back will likely heal faster if you return to being  active before your pain is gone.  Pay attention to your body when you bend and lift. Many people have less discomfortwhen lifting if they bend their knees, keep the load close to their bodies,and avoid twisting. Often, the most comfortable positions are those that put less stress on your recovering back.  Find a comfortable position to sleep. Use a firm mattress and lie on your side with your knees slightly bent. If you lie on your back, put a pillow under your knees.  Only take over-the-counter or prescription medicines as directed by your caregiver. Over-the-counter medicines to reduce pain and inflammation are often the most  helpful.Your caregiver may prescribe muscle relaxant drugs.These medicines help dull your pain so you can more quickly return to your normal activities and healthy exercise.  Put ice on the injured area.  Put ice in a plastic bag.  Place a towel between your skin and the bag.  Leave the ice on for 15-20 minutes, 03-04 times a day for the first 2 to 3 days. After that, ice and heat may be alternated to reduce pain and spasms.  Ask your caregiver about trying back exercises and gentle massage. This may be of some benefit.  Avoid feeling anxious or stressed.Stress increases muscle tension and can worsen back pain.It is important to recognize when you are anxious or stressed and learn ways to manage it.Exercise is a great option. SEEK MEDICAL CARE IF:  You have pain that is not relieved with rest or medicine.  You have pain that does not improve in 1 week.  You have new symptoms.  You are generally not feeling well. SEEK IMMEDIATE MEDICAL CARE IF:   You have pain that radiates from your back into your legs.  You develop new bowel or bladder control problems.  You have unusual weakness or numbness in your arms or legs.  You develop nausea or vomiting.  You develop abdominal pain.  You feel faint. Document Released: 07/01/2005 Document Revised: 12/31/2011 Document Reviewed: 11/19/2010 [facility] Patient Information 2015 [place], [place]. This information is not intended to replace advice given to you by your health care Natascha Edmonds. Make sure you discuss any questions you have with your health care Angel Weedon.

## 2014-01-07 NOTE — Progress Notes (Signed)
Pt is here following up on her HTN, asthma, hypothyroidism and chronic lower back pain. Pt states that she was standing still and then she got extreme pain in her lower back. She has a history of chronic bronchitis and she states she really having trouble lately.

## 2014-01-07 NOTE — Progress Notes (Signed)
Patient ID: [name] [name], female   DOB: 1955/09/03, 58 y.o.   MRN: [id number]   [name] [name], is a 58 y.o. female  [id number]:[id number]  [id number]  DOB - July 06, 1956  Chief Complaint  Patient presents with  . Follow-up        Subjective:   [name] [name] is a 58 y.o. female here today for a follow up visit. Patient has extensive history of coronary artery disease status post multiple stenting, dyslipidemia, COPD, hypertension, chronic rhinitis and sinusitis, and chronic back pain among others as listed below. Patient is here today for a refill of some of her medications. Her major complaint is her low back pain, which is ongoing and chronic. She follows up regularly with cardiologist and she is up-to-date with her lab draw and medications, she claims compliant with all her medications. She also has some issues with depression, anxiety, and a recent suicide attempt/ideation family she was admitted to the behavioral health Department. She follows up with [facility] and she has all her medications. Patient claims she is getting better in that department and she has a therapist. She smokes heavily still about 1 pack per day, she drinks alcohol heavy occasionally. Patient has No headache, No chest pain, No abdominal pain - No Nausea, No new weakness tingling or numbness, No Cough - SOB.  No problems updated.  ALLERGIES: Allergies  Allergen Reactions  . Other Anaphylaxis    avacdos  . Codeine Nausea Only  . Latex     REACTION: shortness of breath and rash    PAST MEDICAL HISTORY: Past Medical History  Diagnosis Date  . Thyroid disease   . Coronary artery disease   . Hernia   . Chronic back pain     herniated nucleus pulposus  . Hyperlipidemia     takes Lipitor daily  . Hypertension     takes Losartan daily and Labetalol bid  . Heart attack 2011  . Asthma   . COPD (chronic obstructive pulmonary disease)     early stages  . Shortness of breath     with exertion  . Bronchitis     couple  of months ago  . Arthritis   . Joint pain   . Neck pain   . Bruises easily     pt is on Effient  . Psoriasis     elbows,knees,back  . H/O hiatal hernia   . GERD (gastroesophageal reflux disease)     takes Nexium daily  . Hemorrhoids   . Diverticulosis   . Slowing of urinary stream   . Hypothyroidism     takes Synthroid daily  . Depression     takes Klonopin daily  . Anxiety     takes Lexapro daily  . Insomnia     hydroxyzine prn  . Bartholin gland cyst 08/29/2011    MEDICATIONS AT HOME: Prior to Admission medications   Medication Sig Start Date End Date Taking? Authorizing Provider  albuterol (PROVENTIL HFA;VENTOLIN HFA) 108 (90 BASE) MCG/ACT inhaler Inhale 2 puffs into the lungs every 6 (six) hours as needed. For shortess of breath 12/29/13  Yes Angelica Chessman, MD  atorvastatin (LIPITOR) 40 MG tablet Take 1 tablet by mouth daily. <Please make appointment for future refills> 01/07/14  Yes Angelica Chessman, MD  Calcium 600 MG tablet  07/14/13  Yes Waylan Boga, NP  escitalopram (LEXAPRO) 10 MG tablet Take 1 tablet (10 mg total) by mouth daily. 07/14/13  Yes Waylan Boga, NP  esomeprazole (NEXIUM) 40 MG capsule Take  1 capsule (40 mg total) by mouth daily. 07/14/13  Yes Waylan Boga, NP  fluticasone (FLONASE) 50 MCG/ACT nasal spray Place 2 sprays into both nostrils at bedtime. 07/14/13  Yes Waylan Boga, NP  Fluticasone-Salmeterol (ADVAIR) 100-50 MCG/DOSE AEPB Inhale 1 puff into the lungs 2 (two) times daily. 12/29/13  Yes Angelica Chessman, MD  furosemide (LASIX) 20 MG tablet TAKE 1 TABLET BY MOUTH DAILY. 11/04/13  Yes Angelica Chessman, MD  gabapentin (NEURONTIN) 600 MG tablet Take 1 tablet (600 mg total) by mouth 4 (four) times daily. 07/14/13  Yes Waylan Boga, NP  ipratropium (ATROVENT) 0.02 % nebulizer solution Take 2.5 mLs (0.5 mg total) by nebulization every 4 (four) hours as needed for wheezing or shortness of breath. 08/19/13  Yes Gregor Hams, MD  labetalol (NORMODYNE) 100  MG tablet Take 1 tablet (100 mg total) by mouth 2 (two) times daily. 07/23/13  Yes Angelica Chessman, MD  levothyroxine (SYNTHROID, LEVOTHROID) 100 MCG tablet Take 1 tablet (100 mcg total) by mouth daily. 07/14/13  Yes Waylan Boga, NP  losartan (COZAAR) 50 MG tablet Take 1 tablet (50 mg total) by mouth daily. 07/14/13  Yes Waylan Boga, NP  montelukast (SINGULAIR) 10 MG tablet TAKE 1 TABLET BY MOUTH AT BEDTIME. 11/04/13  Yes Angelica Chessman, MD  omeprazole (PRILOSEC) 40 MG capsule TAKE 1 CAPSULE BY MOUTH DAILY.   Yes Angelica Chessman, MD  prasugrel (EFFIENT) 10 MG TABS tablet Take 1 tablet (10 mg total) by mouth daily. 07/14/13  Yes Waylan Boga, NP  risperiDONE (RISPERDAL) 1 MG tablet Take 1 mg by mouth at bedtime.   Yes Historical Provider, MD  traMADol (ULTRAM) 50 MG tablet Take 1 tablet (50 mg total) by mouth every 12 (twelve) hours as needed (cough). 01/07/14  Yes Angelica Chessman, MD  amoxicillin (AMOXIL) 500 MG capsule Take 1 capsule (500 mg total) by mouth 3 (three) times daily. 08/19/13   Gregor Hams, MD  aspirin 325 MG tablet Take 1 tablet (325 mg total) by mouth daily. 07/14/13   Waylan Boga, NP  cyclobenzaprine (FLEXERIL) 10 MG tablet Take 1 tablet (10 mg total) by mouth 3 (three) times daily as needed for muscle spasms. 01/07/14   Angelica Chessman, MD  PredniSONE 10 MG KIT 12 day dose pack po 08/19/13   Gregor Hams, MD     Objective:   Filed Vitals:   01/07/14 1209  BP: 126/76  Pulse: 60  Temp: 98.1 F (36.7 C)  TempSrc: Oral  Resp: 16  Height: '5\' 1"'  (1.549 m)  Weight: 138 lb (62.596 kg)  SpO2: 91%    Exam General appearance : Awake, alert, not in any distress. Speech Clear. Not toxic looking,  HEENT: Atraumatic and Normocephalic, pupils equally reactive to light and accomodation Neck: supple, no JVD. No cervical lymphadenopathy.  Chest:Good air entry bilaterally, no added sounds  CVS: S1 S2 regular, no murmurs.  Abdomen: Bowel sounds present, Non tender and not  distended with no gaurding, rigidity or rebound. Extremities: B/L Lower Ext shows no edema, both legs are warm to touch Neurology: Awake alert, and oriented X 3, CN II-XII intact, Non focal Skin:No Rash Wounds:N/A  Data Review No results found for this basename: HGBA1C     Assessment & Plan   1. CAD S/P percutaneous coronary angioplasty Continue current medications which includes beta blocker, ACE inhibitor, Prasugrel and Statin Patient counseled extensively about smoking cessation  2. Essential hypertension, benign Continue current regimen Continue to follow with cardiologist  3. Chronic back  pain  - cyclobenzaprine (FLEXERIL) 10 MG tablet; Take 1 tablet (10 mg total) by mouth 3 (three) times daily as needed for muscle spasms.  Dispense: 60 tablet; Refill: 0 - traMADol (ULTRAM) 50 MG tablet; Take 1 tablet (50 mg total) by mouth every 12 (twelve) hours as needed (cough).  Dispense: 60 tablet; Refill: 0  4. Dyslipidemia Refill - atorvastatin (LIPITOR) 40 MG tablet; Take 1 tablet by mouth daily. <Please make appointment for future refills>  Dispense: 90 tablet; Refill: 3  Patient was counseled extensively about smoking cessation again Patient was counseled extensively on nutrition and exercise  Return in about 3 months (around 04/09/2014), or if symptoms worsen or fail to improve, for Follow up HTN, Follow up Pain and comorbidities, Annual Physical.  The patient was given clear instructions to go to ER or return to medical center if symptoms don't improve, worsen or new problems develop. The patient verbalized understanding. The patient was told to call to get lab results if they haven't heard anything in the next week.   This note has been created with Surveyor, quantity. Any transcriptional errors are unintentional.    Angelica Chessman, MD, [place], [place], [place] and [place],  [place]   01/07/2014, 12:37 PM

## 2014-01-12 NOTE — Telephone Encounter (Signed)
Pt picked up supply today

## 2014-04-06 ENCOUNTER — Encounter (HOSPITAL_COMMUNITY): Payer: Self-pay | Admitting: Emergency Medicine

## 2014-04-06 ENCOUNTER — Other Ambulatory Visit: Payer: Self-pay | Admitting: Internal Medicine

## 2014-04-06 ENCOUNTER — Emergency Department (HOSPITAL_COMMUNITY)
Admission: EM | Admit: 2014-04-06 | Discharge: 2014-04-06 | Disposition: A | Discharge status: Home / Self Care | Payer: No Typology Code available for payment source | Source: Home / Self Care | Attending: Emergency Medicine | Admitting: Emergency Medicine

## 2014-04-06 DIAGNOSIS — F17200 Nicotine dependence, unspecified, uncomplicated: Secondary | ICD-10-CM

## 2014-04-06 DIAGNOSIS — J441 Chronic obstructive pulmonary disease with (acute) exacerbation: Secondary | ICD-10-CM

## 2014-04-06 MED ORDER — ALBUTEROL SULFATE (2.5 MG/3ML) 0.083% IN NEBU
5.0000 mg | INHALATION_SOLUTION | Freq: Once | RESPIRATORY_TRACT | Status: AC
  Administered 2014-04-06: 5 mg via RESPIRATORY_TRACT

## 2014-04-06 MED ORDER — PREDNISONE 20 MG PO TABS: ORAL_TABLET | ORAL | Status: DC

## 2014-04-06 MED ORDER — IPRATROPIUM BROMIDE 0.02 % IN SOLN
RESPIRATORY_TRACT | Status: AC
  Filled 2014-04-06: qty 2.5

## 2014-04-06 MED ORDER — IPRATROPIUM-ALBUTEROL 0.5-2.5 (3) MG/3ML IN SOLN
3.0000 mL | Freq: Once | RESPIRATORY_TRACT | Status: AC
  Administered 2014-04-06: 3 mL via RESPIRATORY_TRACT

## 2014-04-06 MED ORDER — TRAMADOL HCL 50 MG PO TABS: ORAL_TABLET | ORAL | Status: DC

## 2014-04-06 MED ORDER — AMOXICILLIN 500 MG PO CAPS: 1000.0000 mg | ORAL_CAPSULE | Freq: Three times a day (TID) | ORAL | Status: DC

## 2014-04-06 MED ORDER — METHYLPREDNISOLONE ACETATE 80 MG/ML IJ SUSP
80.0000 mg | Freq: Once | INTRAMUSCULAR | Status: AC
  Administered 2014-04-06: 80 mg via INTRAMUSCULAR

## 2014-04-06 MED ORDER — METHYLPREDNISOLONE ACETATE 80 MG/ML IJ SUSP
INTRAMUSCULAR | Status: AC
  Filled 2014-04-06: qty 1

## 2014-04-06 MED ORDER — ALBUTEROL SULFATE (2.5 MG/3ML) 0.083% IN NEBU
INHALATION_SOLUTION | RESPIRATORY_TRACT | Status: AC
  Filled 2014-04-06: qty 6

## 2014-04-06 NOTE — ED Provider Notes (Signed)
Chief Complaint   Cough   History of Present Illness   [name] [name] is a 58 year old female with COPD and asthma and continues to smoke cigarettes. She presents with a one-week history of nonproductive cough, chest tightness, and wheezing. It feels like "her chest is on fire". The pain is worse with cough and with deep inspiration. She also notes pain in her ears, nose, and throat. She has nasal congestion with clear, bloody drainage, chills, and loose stools. She denies any fever. The patient has had asthma all her life, and was diagnosed about 2 years ago as having emphysema.  Review of Systems   Other than as noted above, the patient denies any of the following symptoms: Systemic:  No fevers, chills, sweats, or weight loss. ENT:  No nasal congestion, sneezing, itching, postnasal drip, sinus pressure, headache, sore throat, or hoarseness. Lungs:  No wheezing, shortness of breath, chest tightness or congestion. Heart:  No chest pain, tightness, pressure, PND, orthopnea, or ankle edema. GI:  No indigestion, heartburn, waterbrash, burping, abdominal pain, nausea, or vomiting.  [place]   Past medical history, family history, social history, meds, and allergies were reviewed. She is allergic to codeine and latex. Current meds include aspirin, Lipitor, calcium, Flexeril, Lexapro, Nexium, Flonase, care, Lasix, Neurontin, Atrovent, labetalol, Synthroid, Cozaar, Singulair, Prilosec, Effient, Risperdal, and tramadol. Medical problems include hypothyroidism, coronary artery disease, chronic lower back pain, hyperlipidemia, hypertension, history of heart attack, asthma, COPD, gastroesophageal reflux, hemorrhoids, diverticulosis, psoriasis, depression, and anxiety.  Physical Examination     Vital signs:  BP 128/76  Pulse 75  Temp(Src) 98.9 F (37.2 C) (Oral)  Resp 20  SpO2 96% General:  Alert and oriented.  In no distress.  Skin warm and dry. ENT: TMs and ear canals normal.  Nasal mucosa normal,  without drainage.  Pharynx clear without exudate or drainage.  No intraoral lesions. Neck:  No adenopathy, tenderness or mass.  No JVD. Lungs:  No respiratory distress.  She has bilateral expiratory wheezes, no rales or rhonchi, good air movement bilaterally. Heart:  Regular rhythm, no gallops or murmers.  No pedal edema. Abdomon:  Soft and nontender.  No organomegaly or mass.  Course in [facility] [place]   She was given a DuoNeb breathing treatment and Depo-Medrol 80 mg IM. Thereafter she felt better and her lungs were clear and wheeze free. She was in no respiratory distress.  Assessment   The primary encounter diagnosis was COPD exacerbation. A diagnosis of Smoker was also pertinent to this visit.  She desperately needs to quit smoking. I had a long conversation with her about this. She thinks she may try the patches and the gum.  Plan     1.  Meds:  The following meds were prescribed:   Discharge Medication List as of 04/06/2014  3:36 PM    START taking these medications   Details  !! amoxicillin (AMOXIL) 500 MG capsule Take 2 capsules (1,000 mg total) by mouth 3 (three) times daily., Starting 04/06/2014, Until Discontinued, Normal    predniSONE (DELTASONE) 20 MG tablet Take 3 daily for 5 days, 2 daily for 5 days, 1 daily for 5 days., Normal    !! traMADol (ULTRAM) 50 MG tablet Take 1 to 2 tablets every 8 hours as needed for cough., Print     !! - Potential duplicate medications found. Please discuss with provider.      2.  Patient Education/Counseling:  The patient was given appropriate handouts, self care instructions, and instructed  in symptomatic relief.    3.  Follow up:  The patient was told to follow up here if no better in 3 to 4 days, or sooner if becoming worse in any way, and given some red flag symptoms such as difficulty breathing or chest pain which would prompt immediate return.         Harden Mo, MD 04/06/14 [phone]

## 2014-04-06 NOTE — Discharge Instructions (Signed)
Chronic Obstructive Pulmonary Disease Exacerbation Chronic obstructive pulmonary disease (COPD) is a common lung condition in which airflow from the lungs is limited. COPD is a general term that can be used to describe many different lung problems that limit airflow, including chronic bronchitis and emphysema. COPD exacerbations are episodes when breathing symptoms become much worse and require extra treatment. Without treatment, COPD exacerbations can be life threatening, and frequent COPD exacerbations can cause further damage to your lungs. CAUSES   Respiratory infections.   Exposure to smoke.   Exposure to air pollution, chemical fumes, or dust. Sometimes there is no apparent cause or trigger. RISK FACTORS  Smoking cigarettes.  Older age.  Frequent prior COPD exacerbations. SIGNS AND SYMPTOMS   Increased coughing.   Increased thick spit (sputum) production.   Increased wheezing.   Increased shortness of breath.   Rapid breathing.   Chest tightness. DIAGNOSIS  Your medical history, a physical exam, and tests will help your health care provider make a diagnosis. Tests may include:  A chest X-ray.  Basic lab tests.  Sputum testing.  An arterial blood gas test. TREATMENT  Depending on the severity of your COPD exacerbation, you may need to be admitted to a hospital for treatment. Some of the treatments commonly used to treat COPD exacerbations are:   Antibiotic medicines.   Bronchodilators. These are drugs that expand the air passages. They may be given with an inhaler or nebulizer. Spacer devices may be needed to help improve drug delivery.  Corticosteroid medicines.  Supplemental oxygen therapy.  HOME CARE INSTRUCTIONS   Do not smoke. Quitting smoking is very important to prevent COPD from getting worse and exacerbations from happening as often.  Avoid exposure to all substances that irritate the airway, especially to tobacco smoke.   If you were  prescribed an antibiotic medicine, finish it all even if you start to feel better.  Take all medicines as directed by your health care provider.It is important to use correct technique with inhaled medicines.  Drink enough fluids to keep your urine clear or pale yellow (unless you have a medical condition that requires fluid restriction).  Use a cool mist vaporizer. This makes it easier to clear your chest when you cough.   If you have a home nebulizer and oxygen, continue to use them as directed.   Maintain all necessary vaccinations to prevent infections.   Exercise regularly.   Eat a healthy diet.   Keep all follow-up appointments as directed by your health care provider. SEEK IMMEDIATE MEDICAL CARE IF:  You have worsening shortness of breath.   You have trouble talking.   You have severe chest pain.  You have blood in your sputum.  You have a fever.  You have weakness, vomit repeatedly, or faint.   You feel confused.   You continue to get worse. MAKE SURE YOU:   Understand these instructions.  Will watch your condition.  Will get help right away if you are not doing well or get worse. Document Released: 04/28/2007 Document Revised: 11/15/2013 Document Reviewed: 03/05/2013 [facility] Patient Information 2015 [place], [place]. This information is not intended to replace advice given to you by your health care provider. Make sure you discuss any questions you have with your health care provider.   How to Quit Smoking  According to the U.S. Surgeon General, about 440,000 people in the [place] alone die from complications related to tobacco use.  More deaths occur due to cigarette smoking than illegal drug use,  AIDS, car accidents, alcohol-related deaths, suicide and homicide combined.  Smoking accounts for about 30% of all cancer related deaths, including more than 80% of lung cancer deaths. Smoking has also been linked as the cause of many other diseases  like heart disease, bronchitis, emphysema, stroke, and complications of pneumonia as well as causing an increased risk of miscarriage, premature births, stillbirth, infant death, and low birth weight in infants. ° °For this reason, the U.S. Surgeon General recommends: ° °"Smoking cessation (stopping smoking) represents the single most important step that smokers can take to enhance the length and quality of their lives." ° °Why is it so hard to Quit? ° °Tobacco products contain Nicotine which is highly addictive - probably as addictive as heroin or cocaine.  Over time, your body becomes both physically and psychologically dependent on it. Finally, attempts to quit smoking are complicated by withdrawal reactions like depression, irritability, trouble sleeping, trouble concentrating, restlessness, headaches, weight gain, and excessive fatigue as well as a lack of support.  These symptoms can last from a few days to several weeks. ° °Why should you Quit? °· Live longer and healthier °· Can improves the health of your housemates (children, spouse) °· Increases your energy and breathing ability °· Lowers risk of heart attack, stroke and cancer °· Saves money - For example, if you smoke a pack of cigarettes a day and each pack costs about $3.00, then you will save about $1,100.00 per year, about $5,500. in 5 years and $11,000.00 in 10 years. ° °What you can do: °1. Talk to your health care provider - There are many smoking cessations aids available, both prescription or over-the-counter.  Check with your doctor and pharmacist before taking any of these products to see which one is best for you.  Develop a plan with your healthcare provider which may include nicotine replacement, prescription medication and/or counseling. ° °2. Get Started - Mark a start date on your calendar.  Remove cigarettes and ashtrays from your home, car, and office.  Don’t be around other smokers.  Stop smoking…not even a puff! ° °3. Support - Talk  to family, friends, and co-workers about your plan to stop smoking.  Ask them not to smoke around you. ° °4. Coping Strategies ° °The four “A”s to help during tough times: ° °a. Avoid - Avoid other smokers or places where smoking is commonplace.  Avoid alcoholic beverages as these may increase your desire to smoke °b. Alter - Change your routine.  Drink water/juices instead of alcohol or coffee.  Take a walk or visit with someone during your coffee break.  Change your route to work. °c. Alternatives - Substitute raw vegetables like carrot sticks or celery, sugarless candy or gum for the habit of having a cigarette. °d. Activities - Start an exercise program (talk to your doctor prior to beginning any exercise program). Try out a new “hands on” hobby to distract you from smoking and to keep your hands busy like woodworking or needlepoint. ° ° Additional tips for specific withdrawal symptoms: ° ° Cravings for tobacco:  — Distract yourself  °     — Deep-breathing exercises  °     — Remember that cravings are brief  ° ° Irritability:   — Take a few slow, deep breaths °     — Soak in a hot bath °  ° Insomnia:   — Take a walk several hours before bedtime °     — Avoid caffeinated beverages after noon °     —   Read a book       Take a warm bath       Banana or warm milk    Increased appetite:   Drink water or low-calorie drinks       Make a survival Kit: Include straws, cinnamon        sticks,        coffee stirrers, licorice, toothpicks, gum, or fresh         vegetables    Inability to concentrate:  Take a brisk walk       Lighten your schedule for a couple of days       Take more breaks   Fatigue:    Get a good nights sleep       Take a nap       Dont overdo it for 2-4 weeks    Constipation, gas,  stomach pain:    Drink plenty of fluids       Increase fiber: fruit, raw vegetables, whole grain        cereals       Talk to your doctor about diet changes    5. Dealing with Relapses -  Most relapses occur within the first 2-3 months.  This is common so dont be discouraged.  Some people may take several attempts before they can quit smoking completely.  6. Reward yourself - Set-up a rewards program for every milestone, like 1st month after quitting, 3rd month after quitting, and 6th month after quitting to keep you motivated.  What your doctor can do: Perform a physical exam and order diagnostic tests like laboratory blood work and a chest X-ray.  This will help to identify health related conditions that might benefit from smoking cessation. Review your health history to make sure there are no contraindications with specific smoking cessation aids like allergies to medications or ingredients in these medications or conflicts with your current medications. Prescribe smoking cessation aids such as: Over-the-counter aid:  Nicotine gum, Nicotine patch Prescription aids:  Nicotine spray, Nicotine inhaler, or Bupropion SR (non-nicotine). Offer or recommend individual or group counseling to support you during the initial quitting and maintenance phase of your smoking cessation program. Offer or recommend other alternative treatments like hypnosis or acupuncture.  What you can expect: Benefits from quitting smoking:  Improved Physical Appearance - Minimizes or stops premature wrinkling of skin, bad breath, stained teeth, gum disease, clothes/hair smoke odors, and yellow fingernails  Improved Daily Activities - Food tastes better, sense of smell improves, and decreases shortness of breath during ordinary activities like climbing stairs, walking, and performing light housework  Decreased Financial Cost - From both no longer purchasing cigarettes and the health care cost for medical treatment of conditions caused by smoking.  Health of Others - Decreases risk of exposing others to the effects of second hand smoke.  Sets an example for youth. Benefits of Quitting according to research  from the U.S. Surgeon General:  20 minutes after:  Blood pressure lowers & Body temperature normalizes  8 hours after: Carbon monoxide levels begins to normalize   24 hours after: Heart attack risk decreases  2 weeks to 3 months after:  Blood flow improves & lung function increases   1 to 9 months after:  Coughing, sinus congestion, fatigue, shortness of breath decrease   1 year after: Risk of developing coronary heart disease is half that of a smoker  5 years after: Risk of a stroke decreases to that of a nonsmoker  10 years after: Risk of death due to lung cancer death is halved.  Risk of oral, throat, esophagus, bladder, kidney, and pancreatic cancer decreases.  15 years after: Risk of developing coronary heart disease is half that of a nonsmoker      References:  Here are references that can provide additional information and support:  [place] (800) ACS-2345       (800) Q2878766 or (800) 845-818-5542 www.cancer.org       www.amhrt.Pharmacist, community [employer]up[employer] 5806344151 or 440-812-5133  (800331-590-4761 or (213)460-4064 www.lungusa.org       www.medicalacupuncture.[place]     Office on [place] for Disease Control and Prevention (800) 4-CANCER or (800) Y5278638  660-492-0186 www.cancer.gov       [employer].ch  Nicotine Anonymous      Smokefree.gov 469-378-8895) TRY-NICA 209-163-1377)   (437)589-9439) 44U-QUIT 339-387-6042) www.nicotine-anonymous.org   www.smokefree.gov  Contact your doctor or pharmacist if you have specific questions about starting a smoking cessation program.

## 2014-04-06 NOTE — ED Notes (Signed)
Cough x past couple of days. Minimal relief reported w her usual medications

## 2014-04-08 ENCOUNTER — Other Ambulatory Visit: Payer: Self-pay | Admitting: Internal Medicine

## 2014-04-11 ENCOUNTER — Ambulatory Visit: Payer: Self-pay | Admitting: Internal Medicine

## 2014-04-20 ENCOUNTER — Other Ambulatory Visit: Payer: Self-pay | Admitting: Internal Medicine

## 2014-04-25 ENCOUNTER — Telehealth: Payer: Self-pay | Admitting: Internal Medicine

## 2014-04-25 ENCOUNTER — Other Ambulatory Visit: Payer: Self-pay | Admitting: *Deleted

## 2014-04-25 ENCOUNTER — Other Ambulatory Visit: Payer: Self-pay | Admitting: Internal Medicine

## 2014-04-25 DIAGNOSIS — I2510 Atherosclerotic heart disease of native coronary artery without angina pectoris: Secondary | ICD-10-CM

## 2014-04-25 DIAGNOSIS — G8929 Other chronic pain: Secondary | ICD-10-CM

## 2014-04-25 DIAGNOSIS — M549 Dorsalgia, unspecified: Principal | ICD-10-CM

## 2014-04-25 DIAGNOSIS — Z9861 Coronary angioplasty status: Secondary | ICD-10-CM

## 2014-04-25 MED ORDER — TRAMADOL HCL 50 MG PO TABS: 50.0000 mg | ORAL_TABLET | Freq: Two times a day (BID) | ORAL | Status: DC | PRN

## 2014-04-25 MED ORDER — MONTELUKAST SODIUM 10 MG PO TABS: ORAL_TABLET | ORAL | Status: DC

## 2014-04-25 MED ORDER — ESOMEPRAZOLE MAGNESIUM 40 MG PO CPDR: 40.0000 mg | DELAYED_RELEASE_CAPSULE | Freq: Every day | ORAL | Status: DC

## 2014-04-25 MED ORDER — CYCLOBENZAPRINE HCL 10 MG PO TABS: 10.0000 mg | ORAL_TABLET | Freq: Three times a day (TID) | ORAL | Status: DC | PRN

## 2014-04-25 NOTE — Telephone Encounter (Signed)
I reordered her medication and made pt make another appointment.

## 2014-04-25 NOTE — Telephone Encounter (Signed)
Patient is concerned because she needs refillS for: singulair 10 mg, nexium 40 mg and flexeril 10 mg ASAP. Please follow up with Patient, she is waiting at the [facility].

## 2014-05-16 ENCOUNTER — Encounter (HOSPITAL_COMMUNITY): Payer: Self-pay | Admitting: Emergency Medicine

## 2014-08-09 ENCOUNTER — Ambulatory Visit: Payer: Self-pay | Attending: Internal Medicine | Admitting: Internal Medicine

## 2014-08-09 ENCOUNTER — Encounter: Payer: Self-pay | Admitting: Internal Medicine

## 2014-08-09 ENCOUNTER — Other Ambulatory Visit: Payer: Self-pay | Admitting: Internal Medicine

## 2014-08-09 VITALS — BP 163/85 | HR 66 | Temp 98.7°F | Resp 16 | Ht 61.0 in | Wt 144.0 lb

## 2014-08-09 DIAGNOSIS — J328 Other chronic sinusitis: Secondary | ICD-10-CM | POA: Insufficient documentation

## 2014-08-09 DIAGNOSIS — IMO0001 Reserved for inherently not codable concepts without codable children: Secondary | ICD-10-CM | POA: Insufficient documentation

## 2014-08-09 DIAGNOSIS — Z1239 Encounter for other screening for malignant neoplasm of breast: Secondary | ICD-10-CM

## 2014-08-09 DIAGNOSIS — M549 Dorsalgia, unspecified: Secondary | ICD-10-CM | POA: Insufficient documentation

## 2014-08-09 DIAGNOSIS — J449 Chronic obstructive pulmonary disease, unspecified: Secondary | ICD-10-CM | POA: Insufficient documentation

## 2014-08-09 DIAGNOSIS — F17210 Nicotine dependence, cigarettes, uncomplicated: Secondary | ICD-10-CM | POA: Insufficient documentation

## 2014-08-09 DIAGNOSIS — M25449 Effusion, unspecified hand: Secondary | ICD-10-CM

## 2014-08-09 DIAGNOSIS — I252 Old myocardial infarction: Secondary | ICD-10-CM | POA: Insufficient documentation

## 2014-08-09 DIAGNOSIS — G8929 Other chronic pain: Secondary | ICD-10-CM

## 2014-08-09 DIAGNOSIS — E038 Other specified hypothyroidism: Secondary | ICD-10-CM

## 2014-08-09 DIAGNOSIS — Z23 Encounter for immunization: Secondary | ICD-10-CM

## 2014-08-09 DIAGNOSIS — K219 Gastro-esophageal reflux disease without esophagitis: Secondary | ICD-10-CM | POA: Insufficient documentation

## 2014-08-09 DIAGNOSIS — J439 Emphysema, unspecified: Secondary | ICD-10-CM | POA: Insufficient documentation

## 2014-08-09 DIAGNOSIS — G4700 Insomnia, unspecified: Secondary | ICD-10-CM | POA: Insufficient documentation

## 2014-08-09 DIAGNOSIS — E785 Hyperlipidemia, unspecified: Secondary | ICD-10-CM

## 2014-08-09 DIAGNOSIS — J329 Chronic sinusitis, unspecified: Secondary | ICD-10-CM | POA: Insufficient documentation

## 2014-08-09 DIAGNOSIS — J45909 Unspecified asthma, uncomplicated: Secondary | ICD-10-CM | POA: Insufficient documentation

## 2014-08-09 DIAGNOSIS — I2510 Atherosclerotic heart disease of native coronary artery without angina pectoris: Secondary | ICD-10-CM | POA: Insufficient documentation

## 2014-08-09 DIAGNOSIS — Z7902 Long term (current) use of antithrombotics/antiplatelets: Secondary | ICD-10-CM | POA: Insufficient documentation

## 2014-08-09 DIAGNOSIS — J438 Other emphysema: Secondary | ICD-10-CM

## 2014-08-09 DIAGNOSIS — E039 Hypothyroidism, unspecified: Secondary | ICD-10-CM | POA: Insufficient documentation

## 2014-08-09 DIAGNOSIS — Z7982 Long term (current) use of aspirin: Secondary | ICD-10-CM | POA: Insufficient documentation

## 2014-08-09 DIAGNOSIS — F419 Anxiety disorder, unspecified: Secondary | ICD-10-CM | POA: Insufficient documentation

## 2014-08-09 DIAGNOSIS — I10 Essential (primary) hypertension: Secondary | ICD-10-CM

## 2014-08-09 DIAGNOSIS — Z7951 Long term (current) use of inhaled steroids: Secondary | ICD-10-CM | POA: Insufficient documentation

## 2014-08-09 DIAGNOSIS — F329 Major depressive disorder, single episode, unspecified: Secondary | ICD-10-CM | POA: Insufficient documentation

## 2014-08-09 LAB — COMPLETE METABOLIC PANEL WITH GFR
ALT: 11 U/L (ref 0–35)
AST: 11 U/L (ref 0–37)
Albumin: 4 g/dL (ref 3.5–5.2)
Alkaline Phosphatase: 78 U/L (ref 39–117)
BUN: 9 mg/dL (ref 6–23)
CO2: 26 meq/L (ref 19–32)
Calcium: 9.5 mg/dL (ref 8.4–10.5)
Chloride: 105 mEq/L (ref 96–112)
Creat: 0.84 mg/dL (ref 0.50–1.10)
GFR, Est African American: 89 mL/min
GFR, Est Non African American: 77 mL/min
GLUCOSE: 86 mg/dL (ref 70–99)
Potassium: 4.9 mEq/L (ref 3.5–5.3)
Sodium: 139 mEq/L (ref 135–145)
TOTAL PROTEIN: 6.2 g/dL (ref 6.0–8.3)
Total Bilirubin: 0.4 mg/dL (ref 0.2–1.2)

## 2014-08-09 LAB — CBC WITH DIFFERENTIAL/PLATELET
BASOS ABS: 0.1 10*3/uL (ref 0.0–0.1)
Basophils Relative: 1 % (ref 0–1)
EOS ABS: 0.2 10*3/uL (ref 0.0–0.7)
EOS PCT: 3 % (ref 0–5)
HCT: 42 % (ref 36.0–46.0)
HEMOGLOBIN: 14 g/dL (ref 12.0–15.0)
Lymphocytes Relative: 31 % (ref 12–46)
Lymphs Abs: 2.2 10*3/uL (ref 0.7–4.0)
MCH: 31.3 pg (ref 26.0–34.0)
MCHC: 33.3 g/dL (ref 30.0–36.0)
MCV: 94 fL (ref 78.0–100.0)
MPV: 9.3 fL (ref 8.6–12.4)
Monocytes Absolute: 0.9 10*3/uL (ref 0.1–1.0)
Monocytes Relative: 13 % — ABNORMAL HIGH (ref 3–12)
NEUTROS ABS: 3.6 10*3/uL (ref 1.7–7.7)
NEUTROS PCT: 52 % (ref 43–77)
Platelets: 431 10*3/uL — ABNORMAL HIGH (ref 150–400)
RBC: 4.47 MIL/uL (ref 3.87–5.11)
RDW: 13.3 % (ref 11.5–15.5)
WBC: 7 10*3/uL (ref 4.0–10.5)

## 2014-08-09 LAB — LIPID PANEL
Cholesterol: 146 mg/dL (ref 0–200)
HDL: 57 mg/dL (ref >39)
LDL Cholesterol: 73 mg/dL (ref 0–99)
TRIGLYCERIDES: 78 mg/dL (ref <150)
Total CHOL/HDL Ratio: 2.6 Ratio
VLDL: 16 mg/dL (ref 0–40)

## 2014-08-09 LAB — POCT GLYCOSYLATED HEMOGLOBIN (HGB A1C): Hemoglobin A1C: 5.6

## 2014-08-09 LAB — TSH: TSH: 0.604 u[IU]/mL (ref 0.350–4.500)

## 2014-08-09 LAB — T4, FREE: FREE T4: 1.74 ng/dL (ref 0.80–1.80)

## 2014-08-09 MED ORDER — LABETALOL HCL 100 MG PO TABS: 100.0000 mg | ORAL_TABLET | Freq: Two times a day (BID) | ORAL | Status: DC

## 2014-08-09 MED ORDER — ATORVASTATIN CALCIUM 40 MG PO TABS: ORAL_TABLET | ORAL | Status: DC

## 2014-08-09 MED ORDER — TRAMADOL HCL 50 MG PO TABS: 50.0000 mg | ORAL_TABLET | Freq: Two times a day (BID) | ORAL | Status: DC | PRN

## 2014-08-09 MED ORDER — MONTELUKAST SODIUM 10 MG PO TABS: ORAL_TABLET | ORAL | Status: DC

## 2014-08-09 MED ORDER — LOSARTAN POTASSIUM 50 MG PO TABS: 50.0000 mg | ORAL_TABLET | Freq: Every day | ORAL | Status: DC

## 2014-08-09 MED ORDER — LEVOTHYROXINE SODIUM 100 MCG PO TABS: 100.0000 ug | ORAL_TABLET | Freq: Every day | ORAL | Status: DC

## 2014-08-09 MED ORDER — FUROSEMIDE 20 MG PO TABS: 20.0000 mg | ORAL_TABLET | Freq: Every day | ORAL | Status: DC

## 2014-08-09 MED ORDER — CYCLOBENZAPRINE HCL 10 MG PO TABS
10.0000 mg | ORAL_TABLET | Freq: Three times a day (TID) | ORAL | Status: DC | PRN
Start: 2014-08-09 — End: 2015-06-22

## 2014-08-09 MED ORDER — OMEPRAZOLE 40 MG PO CPDR: 40.0000 mg | DELAYED_RELEASE_CAPSULE | Freq: Every day | ORAL | Status: DC

## 2014-08-09 NOTE — Patient Instructions (Signed)
Smoking Cessation Quitting smoking is important to your health and has many advantages. However, it is not always easy to quit since nicotine is a very addictive drug. Oftentimes, people try 3 times or more before being able to quit. This document explains the best ways for you to prepare to quit smoking. Quitting takes hard work and a lot of effort, but you can do it. ADVANTAGES OF QUITTING SMOKING  You will live longer, feel better, and live better.  Your body will feel the impact of quitting smoking almost immediately.  Within 20 minutes, blood pressure decreases. Your pulse returns to its normal level.  After 8 hours, carbon monoxide levels in the blood return to normal. Your oxygen level increases.  After 24 hours, the chance of having a heart attack starts to decrease. Your breath, hair, and body stop smelling like smoke.  After 48 hours, damaged nerve endings begin to recover. Your sense of taste and smell improve.  After 72 hours, the body is virtually free of nicotine. Your bronchial tubes relax and breathing becomes easier.  After 2 to 12 weeks, lungs can hold more air. Exercise becomes easier and circulation improves.  The risk of having a heart attack, stroke, cancer, or lung disease is greatly reduced.  After 1 year, the risk of coronary heart disease is cut in half.  After 5 years, the risk of stroke falls to the same as a nonsmoker.  After 10 years, the risk of lung cancer is cut in half and the risk of other cancers decreases significantly.  After 15 years, the risk of coronary heart disease drops, usually to the level of a nonsmoker.  If you are pregnant, quitting smoking will improve your chances of having a healthy baby.  The people you live with, especially any children, will be healthier.  You will have extra money to spend on things other than cigarettes. QUESTIONS TO THINK ABOUT BEFORE ATTEMPTING TO QUIT You may want to talk about your answers with your  health care provider.  Why do you want to quit?  If you tried to quit in the past, what helped and what did not?  What will be the most difficult situations for you after you quit? How will you plan to handle them?  Who can help you through the tough times? Your family? Friends? A health care provider?  What pleasures do you get from smoking? What ways can you still get pleasure if you quit? Here are some questions to ask your health care provider:  How can you help me to be successful at quitting?  What medicine do you think would be best for me and how should I take it?  What should I do if I need more help?  What is smoking withdrawal like? How can I get information on withdrawal? GET READY  Set a quit date.  Change your environment by getting rid of all cigarettes, ashtrays, matches, and lighters in your home, car, or work. Do not let people smoke in your home.  Review your past attempts to quit. Think about what worked and what did not. GET SUPPORT AND ENCOURAGEMENT You have a better chance of being successful if you have help. You can get support in many ways.  Tell your family, friends, and coworkers that you are going to quit and need their support. Ask them not to smoke around you.  Get individual, group, or telephone counseling and support. Programs are available at local hospitals and health centers. Call   your local health department for information about programs in your area.  Spiritual beliefs and practices may help some smokers quit.  Download a "quit meter" on your computer to keep track of quit statistics, such as how long you have gone without smoking, cigarettes not smoked, and money saved.  Get a self-help book about quitting smoking and staying off tobacco. LEARN NEW SKILLS AND BEHAVIORS  Distract yourself from urges to smoke. Talk to someone, go for a walk, or occupy your time with a task.  Change your normal routine. Take a different route to work.  Drink tea instead of coffee. Eat breakfast in a different place.  Reduce your stress. Take a hot bath, exercise, or read a book.  Plan something enjoyable to do every day. Reward yourself for not smoking.  Explore interactive web-based programs that specialize in helping you quit. GET MEDICINE AND USE IT CORRECTLY Medicines can help you stop smoking and decrease the urge to smoke. Combining medicine with the above behavioral methods and support can greatly increase your chances of successfully quitting smoking.  Nicotine replacement therapy helps deliver nicotine to your body without the negative effects and risks of smoking. Nicotine replacement therapy includes nicotine gum, lozenges, inhalers, nasal sprays, and skin patches. Some may be available over-the-counter and others require a prescription.  Antidepressant medicine helps people abstain from smoking, but how this works is unknown. This medicine is available by prescription.  Nicotinic receptor partial agonist medicine simulates the effect of nicotine in your brain. This medicine is available by prescription. Ask your health care provider for advice about which medicines to use and how to use them based on your health history. Your health care provider will tell you what side effects to look out for if you choose to be on a medicine or therapy. Carefully read the information on the package. Do not use any other product containing nicotine while using a nicotine replacement product.  RELAPSE OR DIFFICULT SITUATIONS Most relapses occur within the first 3 months after quitting. Do not be discouraged if you start smoking again. Remember, most people try several times before finally quitting. You may have symptoms of withdrawal because your body is used to nicotine. You may crave cigarettes, be irritable, feel very hungry, cough often, get headaches, or have difficulty concentrating. The withdrawal symptoms are only temporary. They are strongest  when you first quit, but they will go away within 10-14 days. To reduce the chances of relapse, try to:  Avoid drinking alcohol. Drinking lowers your chances of successfully quitting.  Reduce the amount of caffeine you consume. Once you quit smoking, the amount of caffeine in your body increases and can give you symptoms, such as a rapid heartbeat, sweating, and anxiety.  Avoid smokers because they can make you want to smoke.  Do not let weight gain distract you. Many smokers will gain weight when they quit, usually less than 10 pounds. Eat a healthy diet and stay active. You can always lose the weight gained after you quit.  Find ways to improve your mood other than smoking. FOR MORE INFORMATION  www.smokefree.gov  Document Released: 06/25/2001 Document Revised: 11/15/2013 Document Reviewed: 10/10/2011 [employer] Patient Information 2015 [employer]. This information is not intended to replace advice given to you by your health care provider. Make sure you discuss any questions you have with your health care provider. DASH Eating Plan DASH stands for "Dietary Approaches to Stop Hypertension." The DASH eating plan is a healthy eating plan that has   been shown to reduce high blood pressure (hypertension). Additional health benefits may include reducing the risk of type 2 diabetes mellitus, heart disease, and stroke. The DASH eating plan may also help with weight loss. WHAT DO I NEED TO KNOW ABOUT THE DASH EATING PLAN? For the DASH eating plan, you will follow these general guidelines:  Choose foods with a percent daily value for sodium of less than 5% (as listed on the food label).  Use salt-free seasonings or herbs instead of table salt or sea salt.  Check with your health care provider or pharmacist before using salt substitutes.  Eat lower-sodium products, often labeled as "lower sodium" or "no salt added."  Eat fresh foods.  Eat more vegetables, fruits, and low-fat dairy  products.  Choose whole grains. Look for the word "whole" as the first word in the ingredient list.  Choose fish and skinless chicken or turkey more often than red meat. Limit fish, poultry, and meat to 6 oz (170 g) each day.  Limit sweets, desserts, sugars, and sugary drinks.  Choose heart-healthy fats.  Limit cheese to 1 oz (28 g) per day.  Eat more home-cooked food and less restaurant, buffet, and fast food.  Limit fried foods.  Cook foods using methods other than frying.  Limit canned vegetables. If you do use them, rinse them well to decrease the sodium.  When eating at a restaurant, ask that your food be prepared with less salt, or no salt if possible. WHAT FOODS CAN I EAT? Seek help from a dietitian for individual calorie needs. Grains Whole grain or whole wheat bread. Brown rice. Whole grain or whole wheat pasta. Quinoa, bulgur, and whole grain cereals. Low-sodium cereals. Corn or whole wheat flour tortillas. Whole grain cornbread. Whole grain crackers. Low-sodium crackers. Vegetables Fresh or frozen vegetables (raw, steamed, roasted, or grilled). Low-sodium or reduced-sodium tomato and vegetable juices. Low-sodium or reduced-sodium tomato sauce and paste. Low-sodium or reduced-sodium canned vegetables.  Fruits All fresh, canned (in natural juice), or frozen fruits. Meat and Other Protein Products Ground beef (85% or leaner), grass-fed beef, or beef trimmed of fat. Skinless chicken or turkey. Ground chicken or turkey. Pork trimmed of fat. All fish and seafood. Eggs. Dried beans, peas, or lentils. Unsalted nuts and seeds. Unsalted canned beans. Dairy Low-fat dairy products, such as skim or 1% milk, 2% or reduced-fat cheeses, low-fat ricotta or cottage cheese, or plain low-fat yogurt. Low-sodium or reduced-sodium cheeses. Fats and Oils Tub margarines without trans fats. Light or reduced-fat mayonnaise and salad dressings (reduced sodium). Avocado. Safflower, olive, or canola  oils. Natural peanut or almond butter. Other Unsalted popcorn and pretzels. The items listed above may not be a complete list of recommended foods or beverages. Contact your dietitian for more options. WHAT FOODS ARE NOT RECOMMENDED? Grains White bread. White pasta. White rice. Refined cornbread. Bagels and croissants. Crackers that contain trans fat. Vegetables Creamed or fried vegetables. Vegetables in a cheese sauce. Regular canned vegetables. Regular canned tomato sauce and paste. Regular tomato and vegetable juices. Fruits Dried fruits. Canned fruit in light or heavy syrup. Fruit juice. Meat and Other Protein Products Fatty cuts of meat. Ribs, chicken wings, bacon, sausage, bologna, salami, chitterlings, fatback, hot dogs, bratwurst, and packaged luncheon meats. Salted nuts and seeds. Canned beans with salt. Dairy Whole or 2% milk, cream, half-and-half, and cream cheese. Whole-fat or sweetened yogurt. Full-fat cheeses or blue cheese. Nondairy creamers and whipped toppings. Processed cheese, cheese spreads, or cheese curds. Condiments Onion and garlic salt,   seasoned salt, table salt, and sea salt. Canned and packaged gravies. Worcestershire sauce. Tartar sauce. Barbecue sauce. Teriyaki sauce. Soy sauce, including reduced sodium. Steak sauce. Fish sauce. Oyster sauce. Cocktail sauce. Horseradish. Ketchup and mustard. Meat flavorings and tenderizers. Bouillon cubes. Hot sauce. Tabasco sauce. Marinades. Taco seasonings. Relishes. Fats and Oils Butter, stick margarine, lard, shortening, ghee, and bacon fat. Coconut, palm kernel, or palm oils. Regular salad dressings. Other Pickles and olives. Salted popcorn and pretzels. The items listed above may not be a complete list of foods and beverages to avoid. Contact your dietitian for more information. WHERE CAN I FIND MORE INFORMATION? National Heart, Lung, and Blood Institute: travelstabloid.com Document Released:  06/20/2011 Document Revised: 11/15/2013 Document Reviewed: 05/05/2013 [facility] Patient Information 2015 [place], [place]. This information is not intended to replace advice given to you by your health care provider. Make sure you discuss any questions you have with your health care provider. Hypertension Hypertension, commonly called high blood pressure, is when the force of blood pumping through your arteries is too strong. Your arteries are the blood vessels that carry blood from your heart throughout your body. A blood pressure reading consists of a higher number over a lower number, such as 110/72. The higher number (systolic) is the pressure inside your arteries when your heart pumps. The lower number (diastolic) is the pressure inside your arteries when your heart relaxes. Ideally you want your blood pressure below 120/80. Hypertension forces your heart to work harder to pump blood. Your arteries may become narrow or stiff. Having hypertension puts you at risk for heart disease, stroke, and other problems.  RISK FACTORS Some risk factors for high blood pressure are controllable. Others are not.  Risk factors you cannot control include:   Race. You may be at higher risk if you are African American.  Age. Risk increases with age.  Gender. Men are at higher risk than women before age 15 years. After age 81, women are at higher risk than men. Risk factors you can control include:  Not getting enough exercise or physical activity.  Being overweight.  Getting too much fat, sugar, calories, or salt in your diet.  Drinking too much alcohol. SIGNS AND SYMPTOMS Hypertension does not usually cause signs or symptoms. Extremely high blood pressure (hypertensive crisis) may cause headache, anxiety, shortness of breath, and nosebleed. DIAGNOSIS  To check if you have hypertension, your health care provider will measure your blood pressure while you are seated, with your arm held at the level of your  heart. It should be measured at least twice using the same arm. Certain conditions can cause a difference in blood pressure between your right and left arms. A blood pressure reading that is higher than normal on one occasion does not mean that you need treatment. If one blood pressure reading is high, ask your health care provider about having it checked again. TREATMENT  Treating high blood pressure includes making lifestyle changes and possibly taking medicine. Living a healthy lifestyle can help lower high blood pressure. You may need to change some of your habits. Lifestyle changes may include:  Following the DASH diet. This diet is high in fruits, vegetables, and whole grains. It is low in salt, red meat, and added sugars.  Getting at least 2 hours of brisk physical activity every week.  Losing weight if necessary.  Not smoking.  Limiting alcoholic beverages.  Learning ways to reduce stress. If lifestyle changes are not enough to get your blood pressure under control,  your health care provider may prescribe medicine. You may need to take more than one. Work closely with your health care provider to understand the risks and benefits. HOME CARE INSTRUCTIONS  Have your blood pressure rechecked as directed by your health care provider.   Take medicines only as directed by your health care provider. Follow the directions carefully. Blood pressure medicines must be taken as prescribed. The medicine does not work as well when you skip doses. Skipping doses also puts you at risk for problems.   Do not smoke.   Monitor your blood pressure at home as directed by your health care provider. SEEK MEDICAL CARE IF:   You think you are having a reaction to medicines taken.  You have recurrent headaches or feel dizzy.  You have swelling in your ankles.  You have trouble with your vision. SEEK IMMEDIATE MEDICAL CARE IF:  You develop a severe headache or confusion.  You have unusual  weakness, numbness, or feel faint.  You have severe chest or abdominal pain.  You vomit repeatedly.  You have trouble breathing. MAKE SURE YOU:   Understand these instructions.  Will watch your condition.  Will get help right away if you are not doing well or get worse. Document Released: 07/01/2005 Document Revised: 11/15/2013 Document Reviewed: 04/23/2013 [facility] Patient Information 2015 [place], [place]. This information is not intended to replace advice given to you by your health care provider. Make sure you discuss any questions you have with your health care provider. Chronic Obstructive Pulmonary Disease Exacerbation Chronic obstructive pulmonary disease (COPD) is a common lung condition in which airflow from the lungs is limited. COPD is a general term that can be used to describe many different lung problems that limit airflow, including chronic bronchitis and emphysema. COPD exacerbations are episodes when breathing symptoms become much worse and require extra treatment. Without treatment, COPD exacerbations can be life threatening, and frequent COPD exacerbations can cause further damage to your lungs. CAUSES   Respiratory infections.   Exposure to smoke.   Exposure to air pollution, chemical fumes, or dust. Sometimes there is no apparent cause or trigger. RISK FACTORS  Smoking cigarettes.  Older age.  Frequent prior COPD exacerbations. SIGNS AND SYMPTOMS   Increased coughing.   Increased thick spit (sputum) production.   Increased wheezing.   Increased shortness of breath.   Rapid breathing.   Chest tightness. DIAGNOSIS  Your medical history, a physical exam, and tests will help your health care provider make a diagnosis. Tests may include:  A chest X-ray.  Basic lab tests.  Sputum testing.  An arterial blood gas test. TREATMENT  Depending on the severity of your COPD exacerbation, you may need to be admitted to a hospital for treatment.  Some of the treatments commonly used to treat COPD exacerbations are:   Antibiotic medicines.   Bronchodilators. These are drugs that expand the air passages. They may be given with an inhaler or nebulizer. Spacer devices may be needed to help improve drug delivery.  Corticosteroid medicines.  Supplemental oxygen therapy.  HOME CARE INSTRUCTIONS   Do not smoke. Quitting smoking is very important to prevent COPD from getting worse and exacerbations from happening as often.  Avoid exposure to all substances that irritate the airway, especially to tobacco smoke.   If you were prescribed an antibiotic medicine, finish it all even if you start to feel better.  Take all medicines as directed by your health care provider.It is important to use correct technique with  inhaled medicines.  Drink enough fluids to keep your urine clear or pale yellow (unless you have a medical condition that requires fluid restriction).  Use a cool mist vaporizer. This makes it easier to clear your chest when you cough.   If you have a home nebulizer and oxygen, continue to use them as directed.   Maintain all necessary vaccinations to prevent infections.   Exercise regularly.   Eat a healthy diet.   Keep all follow-up appointments as directed by your health care provider. SEEK IMMEDIATE MEDICAL CARE IF:  You have worsening shortness of breath.   You have trouble talking.   You have severe chest pain.  You have blood in your sputum.  You have a fever.  You have weakness, vomit repeatedly, or faint.   You feel confused.   You continue to get worse. MAKE SURE YOU:   Understand these instructions.  Will watch your condition.  Will get help right away if you are not doing well or get worse. Document Released: 04/28/2007 Document Revised: 11/15/2013 Document Reviewed: 03/05/2013 [facility] Patient Information 2015 [place], [place]. This information is not intended to replace advice  given to you by your health care provider. Make sure you discuss any questions you have with your health care provider.

## 2014-08-09 NOTE — Progress Notes (Signed)
Pt is here presenting a cold w/ congestion and a sinus headache. Pt needs to have her medications refilled. Pt reports having a spot in between her left 5th and 4th toe.

## 2014-08-09 NOTE — Progress Notes (Signed)
Patient ID: [name] [name], female   DOB: 1956/02/06, 59 y.o.   MRN: [id number]   [name] [name], is a 59 y.o. female  [id number]:[id number]  [id number]:[id number]  DOB - 28-Aug-1955  Chief Complaint  Patient presents with  . Follow-up        Subjective:   [name] [name] is a 59 y.o. female here today for a follow up visit.Patient has extensive history of coronary artery disease status post multiple stenting, dyslipidemia, COPD, hypertension, chronic rhinitis and sinusitis, and chronic back pain among others as listed below. Patient is here today for a refill of some of her medications. Her major complaint is occasional pain in her knuckles which also swell up occasionally. She also describes ongoing low back pain. She follows up regularly with cardiologist. She wants a flu shot today. She admits that she has been sleeping more lately, she thinks it might have something to do with her thyroid function. She follows up with [facility] for her major depression and anxiety, she denies any suicidal ideation or thoughts today. She continues to smoke heavily about 1 pack per day despite repeated counseling. Patient has not had mammogram for this year, she had a colonoscopy recently, she had total hysterectomy and does not need a Pap smear. She claims her sinusitis is acting up at the moment that she needs refill of her Singulair. Patient has No headache, No chest pain, No abdominal pain - No Nausea, No new weakness tingling or numbness, No Cough - SOB.  Problem  Other Chronic Sinusitis  Other Emphysema  Other Specified Hypothyroidism  Joint Swelling, Finger, Hand    ALLERGIES: Allergies  Allergen Reactions  . Other Anaphylaxis    avacdos  . Codeine Nausea Only  . Latex     REACTION: shortness of breath and rash    PAST MEDICAL HISTORY: Past Medical History  Diagnosis Date  . Thyroid disease   . Coronary artery disease   . Hernia   . Chronic back pain     herniated nucleus pulposus  . Hyperlipidemia    takes Lipitor daily  . Hypertension     takes Losartan daily and Labetalol bid  . Heart attack 2011  . Asthma   . COPD (chronic obstructive pulmonary disease)     early stages  . Shortness of breath     with exertion  . Bronchitis     couple of months ago  . Arthritis   . Joint pain   . Neck pain   . Bruises easily     pt is on Effient  . Psoriasis     elbows,knees,back  . H/O hiatal hernia   . GERD (gastroesophageal reflux disease)     takes Nexium daily  . Hemorrhoids   . Diverticulosis   . Slowing of urinary stream   . Hypothyroidism     takes Synthroid daily  . Depression     takes Klonopin daily  . Anxiety     takes Lexapro daily  . Insomnia     hydroxyzine prn  . Bartholin gland cyst 08/29/2011    MEDICATIONS AT HOME: Prior to Admission medications   Medication Sig Start Date End Date Taking? Authorizing Provider  albuterol (PROVENTIL HFA;VENTOLIN HFA) 108 (90 BASE) MCG/ACT inhaler Inhale 2 puffs into the lungs every 6 (six) hours as needed. For shortess of breath 12/29/13  Yes Tresa Garter, MD  aspirin 325 MG tablet Take 1 tablet (325 mg total) by mouth daily. 07/14/13  Yes Waylan Boga, NP  atorvastatin (LIPITOR) 40 MG tablet Take 1 tablet by mouth daily. <Please make appointment for future refills> 08/09/14  Yes Tresa Garter, MD  cyclobenzaprine (FLEXERIL) 10 MG tablet Take 1 tablet (10 mg total) by mouth 3 (three) times daily as needed for muscle spasms. 08/09/14  Yes Tresa Garter, MD  escitalopram (LEXAPRO) 10 MG tablet Take 1 tablet (10 mg total) by mouth daily. 07/14/13  Yes Waylan Boga, NP  fluticasone (FLONASE) 50 MCG/ACT nasal spray Place 2 sprays into both nostrils at bedtime. 07/14/13  Yes Waylan Boga, NP  Fluticasone-Salmeterol (ADVAIR) 100-50 MCG/DOSE AEPB Inhale 1 puff into the lungs 2 (two) times daily. 12/29/13  Yes Tresa Garter, MD  furosemide (LASIX) 20 MG tablet Take 1 tablet (20 mg total) by mouth daily. 08/09/14  Yes  Tresa Garter, MD  gabapentin (NEURONTIN) 600 MG tablet Take 1 tablet (600 mg total) by mouth 4 (four) times daily. 07/14/13  Yes Waylan Boga, NP  ipratropium (ATROVENT) 0.02 % nebulizer solution Take 2.5 mLs (0.5 mg total) by nebulization every 4 (four) hours as needed for wheezing or shortness of breath. 08/19/13  Yes Gregor Hams, MD  labetalol (NORMODYNE) 100 MG tablet Take 1 tablet (100 mg total) by mouth 2 (two) times daily. 08/09/14  Yes Tresa Garter, MD  levothyroxine (SYNTHROID, LEVOTHROID) 100 MCG tablet Take 1 tablet (100 mcg total) by mouth daily. 08/09/14  Yes Tresa Garter, MD  losartan (COZAAR) 50 MG tablet Take 1 tablet (50 mg total) by mouth daily. 08/09/14  Yes Tresa Garter, MD  montelukast (SINGULAIR) 10 MG tablet TAKE 1 TABLET BY MOUTH AT BEDTIME. 08/09/14  Yes Damone Fancher E Doreene Burke, MD  omeprazole (PRILOSEC) 40 MG capsule Take 1 capsule (40 mg total) by mouth daily. 08/09/14  Yes Tresa Garter, MD  prasugrel (EFFIENT) 10 MG TABS tablet Take 1 tablet (10 mg total) by mouth daily. 07/14/13  Yes Waylan Boga, NP  risperiDONE (RISPERDAL) 1 MG tablet Take 1 mg by mouth at bedtime.   Yes Historical Provider, MD  traMADol (ULTRAM) 50 MG tablet Take 1 tablet (50 mg total) by mouth every 12 (twelve) hours as needed (cough). 08/09/14  Yes Tresa Garter, MD  amoxicillin (AMOXIL) 500 MG capsule Take 1 capsule (500 mg total) by mouth 3 (three) times daily. Patient not taking: Reported on 08/09/2014 08/19/13   Gregor Hams, MD  amoxicillin (AMOXIL) 500 MG capsule Take 2 capsules (1,000 mg total) by mouth 3 (three) times daily. Patient not taking: Reported on 08/09/2014 04/06/14   Harden Mo, MD  Calcium 600 MG tablet  07/14/13   Waylan Boga, NP  esomeprazole (NEXIUM) 40 MG capsule Take 1 capsule (40 mg total) by mouth daily. Patient not taking: Reported on 08/09/2014 04/25/14   Tresa Garter, MD  predniSONE (DELTASONE) 20 MG tablet Take 3 daily for 5 days, 2  daily for 5 days, 1 daily for 5 days. Patient not taking: Reported on 08/09/2014 04/06/14   Harden Mo, MD  PredniSONE 10 MG KIT 12 day dose pack po Patient not taking: Reported on 08/09/2014 08/19/13   Gregor Hams, MD     Objective:   Filed Vitals:   08/09/14 0943  BP: 163/85  Pulse: 66  Temp: 98.7 F (37.1 C)  TempSrc: Oral  Resp: 16  Height: _0  (1.549 m)  Weight: 144 lb (65.318 kg)  SpO2: 93%    Exam General appearance : Awake, alert, not in any  distress. Speech Clear. Not toxic looking HEENT: Atraumatic and Normocephalic, pupils equally reactive to light and accomodation Neck: supple, no JVD. No cervical lymphadenopathy.  Chest: Reduced air entry bilaterally with occasional wheezes, no crepitations, no added sounds  CVS: S1 S2 regular, no murmurs.  Abdomen: Bowel sounds present, Non tender and not distended with no gaurding, rigidity or rebound. Extremities: B/L Lower Ext shows no edema, both legs are warm to touch Neurology: Awake alert, and oriented X 3, CN II-XII intact, Non focal Skin:No Rash Wounds:N/A  Data Review No results found for: HGBA1C   Assessment & Plan   1. Essential hypertension, benign  - CBC with Differential/Platelet - COMPLETE METABOLIC PANEL WITH GFR - POCT glycosylated hemoglobin (Hb A1C) - Urinalysis, Complete - Vit D  25 hydroxy (rtn osteoporosis monitoring) - labetalol (NORMODYNE) 100 MG tablet; Take 1 tablet (100 mg total) by mouth 2 (two) times daily.  Dispense: 180 tablet; Refill: 3 - losartan (COZAAR) 50 MG tablet; Take 1 tablet (50 mg total) by mouth daily.  Dispense: 90 tablet; Refill: 3 - omeprazole (PRILOSEC) 40 MG capsule; Take 1 capsule (40 mg total) by mouth daily.  Dispense: 90 capsule; Refill: 3 - furosemide (LASIX) 20 MG tablet; Take 1 tablet (20 mg total) by mouth daily.  Dispense: 90 tablet; Refill: 3  2. Chronic back pain  - traMADol (ULTRAM) 50 MG tablet; Take 1 tablet (50 mg total) by mouth every 12 (twelve)  hours as needed (cough).  Dispense: 60 tablet; Refill: 0 - cyclobenzaprine (FLEXERIL) 10 MG tablet; Take 1 tablet (10 mg total) by mouth 3 (three) times daily as needed for muscle spasms.  Dispense: 90 tablet; Refill: 3  3. Other chronic sinusitis Refill - montelukast (SINGULAIR) 10 MG tablet; TAKE 1 TABLET BY MOUTH AT BEDTIME.  Dispense: 90 tablet; Refill: 3  4. Other emphysema Patient was counseled on smoking cessation  5. Joint swelling, finger, hand, unspecified laterality  -Tramadol - ANA  6. Other specified hypothyroidism  - TSH - T4, Free Refill - levothyroxine (SYNTHROID, LEVOTHROID) 100 MCG tablet; Take 1 tablet (100 mcg total) by mouth daily.  Dispense: 90 tablet; Refill: 3  7. Dyslipidemia Refill - Lipid panel - atorvastatin (LIPITOR) 40 MG tablet; Take 1 tablet by mouth daily. <Please make appointment for future refills>  Dispense: 90 tablet; Refill: 3   Patient was counseled extensively about nutrition and exercise Patient was extensively counseled on smoking cessation   Return in about 3 months (around 11/08/2014), or if symptoms worsen or fail to improve, for COPD, Generalized Anxiety Disorder, Follow up HTN.  The patient was given clear instructions to go to ER or return to medical center if symptoms don't improve, worsen or new problems develop. The patient verbalized understanding. The patient was told to call to get lab results if they haven't heard anything in the next week.   This note has been created with Surveyor, quantity. Any transcriptional errors are unintentional.    Angelica Chessman, MD, [place], [place], [place] and [facility], [place]   08/09/2014, 10:23 AM

## 2014-08-10 LAB — VITAMIN D 25 HYDROXY (VIT D DEFICIENCY, FRACTURES): Vit D, 25-Hydroxy: 21 ng/mL — ABNORMAL LOW (ref 30–100)

## 2014-08-10 LAB — URINALYSIS, COMPLETE
Bilirubin Urine: NEGATIVE
Casts: NONE SEEN
Crystals: NONE SEEN
GLUCOSE, UA: NEGATIVE mg/dL
HGB URINE DIPSTICK: NEGATIVE
Ketones, ur: NEGATIVE mg/dL
Leukocytes, UA: NEGATIVE
Nitrite: NEGATIVE
Protein, ur: NEGATIVE mg/dL
Specific Gravity, Urine: 1.012 (ref 1.005–1.030)
Urobilinogen, UA: 0.2 mg/dL (ref 0.0–1.0)
pH: 5.5 (ref 5.0–8.0)

## 2014-08-10 LAB — ANA: ANA: NEGATIVE

## 2014-08-15 ENCOUNTER — Ambulatory Visit: Payer: Self-pay | Attending: Internal Medicine

## 2014-08-16 ENCOUNTER — Other Ambulatory Visit: Payer: Self-pay | Admitting: Internal Medicine

## 2014-08-16 DIAGNOSIS — E038 Other specified hypothyroidism: Secondary | ICD-10-CM

## 2014-08-16 MED ORDER — LEVOTHYROXINE SODIUM 100 MCG PO TABS: 100.0000 ug | ORAL_TABLET | Freq: Every day | ORAL | Status: DC

## 2014-08-24 ENCOUNTER — Other Ambulatory Visit: Payer: Self-pay | Admitting: *Deleted

## 2014-08-24 ENCOUNTER — Telehealth: Payer: Self-pay | Admitting: Internal Medicine

## 2014-08-24 MED ORDER — ERGOCALCIFEROL 1.25 MG (50000 UT) PO CAPS: 50000.0000 [IU] | ORAL_CAPSULE | ORAL | Status: DC

## 2014-08-24 NOTE — Telephone Encounter (Signed)
Pt aware of lab results, Rx send to [facility] pharmacy    Notes Recorded by Tresa Garter, MD on 08/10/2014 at 3:27 PM Please inform patient that her laboratory test results are mostly within normal limits. Thyroid functions is normal, no evidence of autoimmune disease. Her vitamin D level is low and needs to be replaced.   Please call a prescription vitamin D ergocalciferol 50,000 international unit weekly for 8 weeks.

## 2014-08-24 NOTE — Telephone Encounter (Signed)
Patient called to request blood work results, please f/u with pt.

## 2014-09-05 ENCOUNTER — Ambulatory Visit: Payer: Self-pay | Attending: Internal Medicine

## 2014-09-28 ENCOUNTER — Ambulatory Visit: Payer: Self-pay | Attending: Internal Medicine | Admitting: Family Medicine

## 2014-09-28 VITALS — BP 115/67 | HR 64 | Temp 98.9°F | Resp 16 | Ht 64.5 in | Wt 139.0 lb

## 2014-09-28 DIAGNOSIS — J069 Acute upper respiratory infection, unspecified: Secondary | ICD-10-CM | POA: Insufficient documentation

## 2014-09-28 DIAGNOSIS — J40 Bronchitis, not specified as acute or chronic: Secondary | ICD-10-CM | POA: Insufficient documentation

## 2014-09-28 DIAGNOSIS — Z0000 Encounter for general adult medical examination without abnormal findings: Secondary | ICD-10-CM

## 2014-09-28 DIAGNOSIS — Z8701 Personal history of pneumonia (recurrent): Secondary | ICD-10-CM | POA: Insufficient documentation

## 2014-09-28 DIAGNOSIS — J45909 Unspecified asthma, uncomplicated: Secondary | ICD-10-CM | POA: Insufficient documentation

## 2014-09-28 MED ORDER — PREDNISONE 20 MG PO TABS: 20.0000 mg | ORAL_TABLET | ORAL | Status: DC

## 2014-09-28 MED ORDER — AMOXICILLIN 500 MG PO CAPS: 1000.0000 mg | ORAL_CAPSULE | Freq: Three times a day (TID) | ORAL | Status: DC

## 2014-09-28 NOTE — Progress Notes (Signed)
Patient ID: [name] [name], female   DOB: 05-18-1956, 59 y.o.   MRN: [id number]    Objective:  Patient presents today with upper respiratory symptoms for 2-3 days.  She reports runny vs. Stuffy nose, ears feeling full, sore throat and some cough.  She denies Chills and fever. She has a history of asthma and has had pneumonia in the past.  She has been treated for similar symptoms here with antibiotics and prednisone. She has inhalers and nebulizer at home.  ROS:   See HPI  Objective:  She is alert, oriented, warm and dry, in no acute distress. TMS are dull but not bulgind or reddened. Throat shows generalized redness but no significant swelling or exudate. Neck is supple FROM w/o adenopathy or tenderness.  Lungs have fine crackles in the right lower lobe.  Assessment:  Upper respiratory infection with bronchitis.  Plan:  Have prescribe amoxicillin and prednisone.  She is to follow-up if her symptoms worsen.  Micheline Chapman

## 2014-09-28 NOTE — Progress Notes (Signed)
Pt is here today c/o flu like symptoms w/ sore throat, runny nose, sinus infection, headaches and pressure in her ears.

## 2014-09-28 NOTE — Patient Instructions (Signed)
Drink plenty of liquids Use breathing treatments of needed. Follow-up if condition worsens.

## 2014-10-03 ENCOUNTER — Ambulatory Visit: Payer: Self-pay | Attending: Internal Medicine

## 2014-10-10 ENCOUNTER — Telehealth: Payer: Self-pay | Admitting: Cardiovascular Disease

## 2014-10-10 NOTE — Telephone Encounter (Signed)
Pt need some samples of Effient today if possible please.

## 2014-10-10 NOTE — Telephone Encounter (Signed)
Medication samples have been provided to the patient.  Drug name: effient 10mg   Qty: 35  LOT: [id number]  Exp.Date: 08/2015  Samples left at front desk for patient pick-up. Patient notified and she is aware that she needs an appointment.   Sheral Apley M 2:48 PM 10/10/2014

## 2015-01-10 ENCOUNTER — Other Ambulatory Visit: Payer: Self-pay | Admitting: Internal Medicine

## 2015-02-14 ENCOUNTER — Encounter: Payer: Self-pay | Admitting: Cardiovascular Disease

## 2015-05-09 ENCOUNTER — Telehealth: Payer: Self-pay | Admitting: Cardiovascular Disease

## 2015-05-09 NOTE — Telephone Encounter (Signed)
Pt called in wanting to speak [name] [name] about receiving more Effient. She said she has not been in the office in a while and would have to fill out new paper work in order to receive the medication for free. Please f/u with the pt  Thanks

## 2015-05-11 ENCOUNTER — Ambulatory Visit: Payer: Self-pay | Attending: Family Medicine

## 2015-05-11 ENCOUNTER — Other Ambulatory Visit: Payer: Self-pay | Admitting: *Deleted

## 2015-05-11 ENCOUNTER — Telehealth: Payer: Self-pay | Admitting: Internal Medicine

## 2015-05-11 DIAGNOSIS — G8929 Other chronic pain: Secondary | ICD-10-CM

## 2015-05-11 DIAGNOSIS — M549 Dorsalgia, unspecified: Principal | ICD-10-CM

## 2015-05-11 MED ORDER — TRAMADOL HCL 50 MG PO TABS: 50.0000 mg | ORAL_TABLET | Freq: Two times a day (BID) | ORAL | Status: DC | PRN

## 2015-05-11 NOTE — Telephone Encounter (Signed)
Patient refilled for 30 day supply.

## 2015-05-11 NOTE — Telephone Encounter (Signed)
Spoke with patient, will have her fill out patient assistance paperwork at front desk.  She knows to bring proof of income.

## 2015-05-11 NOTE — Telephone Encounter (Signed)
Pt is here and is requesting a refill on Tramadol. Please follow up with pt. Thank you, Fonda Kinder, ASA

## 2015-05-18 ENCOUNTER — Ambulatory Visit: Payer: Self-pay | Attending: Internal Medicine | Admitting: Family Medicine

## 2015-05-18 ENCOUNTER — Encounter: Payer: Self-pay | Admitting: Family Medicine

## 2015-05-18 VITALS — BP 123/83 | HR 87 | Temp 98.5°F | Resp 18 | Ht 61.0 in | Wt 146.2 lb

## 2015-05-18 DIAGNOSIS — J441 Chronic obstructive pulmonary disease with (acute) exacerbation: Secondary | ICD-10-CM

## 2015-05-18 DIAGNOSIS — F17200 Nicotine dependence, unspecified, uncomplicated: Secondary | ICD-10-CM

## 2015-05-18 DIAGNOSIS — H6121 Impacted cerumen, right ear: Secondary | ICD-10-CM

## 2015-05-18 DIAGNOSIS — F329 Major depressive disorder, single episode, unspecified: Secondary | ICD-10-CM | POA: Insufficient documentation

## 2015-05-18 DIAGNOSIS — J439 Emphysema, unspecified: Secondary | ICD-10-CM

## 2015-05-18 DIAGNOSIS — J4541 Moderate persistent asthma with (acute) exacerbation: Secondary | ICD-10-CM

## 2015-05-18 DIAGNOSIS — F331 Major depressive disorder, recurrent, moderate: Secondary | ICD-10-CM

## 2015-05-18 DIAGNOSIS — J449 Chronic obstructive pulmonary disease, unspecified: Secondary | ICD-10-CM | POA: Insufficient documentation

## 2015-05-18 DIAGNOSIS — Z79899 Other long term (current) drug therapy: Secondary | ICD-10-CM | POA: Insufficient documentation

## 2015-05-18 DIAGNOSIS — Z7982 Long term (current) use of aspirin: Secondary | ICD-10-CM | POA: Insufficient documentation

## 2015-05-18 DIAGNOSIS — J45909 Unspecified asthma, uncomplicated: Secondary | ICD-10-CM | POA: Insufficient documentation

## 2015-05-18 DIAGNOSIS — F17210 Nicotine dependence, cigarettes, uncomplicated: Secondary | ICD-10-CM | POA: Insufficient documentation

## 2015-05-18 MED ORDER — PREDNISONE 20 MG PO TABS: 40.0000 mg | ORAL_TABLET | Freq: Every day | ORAL | Status: DC

## 2015-05-18 MED ORDER — DOXYCYCLINE HYCLATE 100 MG PO TABS: 100.0000 mg | ORAL_TABLET | Freq: Two times a day (BID) | ORAL | Status: DC

## 2015-05-18 MED ORDER — ALBUTEROL SULFATE HFA 108 (90 BASE) MCG/ACT IN AERS
1.0000 | INHALATION_SPRAY | Freq: Four times a day (QID) | RESPIRATORY_TRACT | Status: DC | PRN
Start: 2015-05-18 — End: 2016-02-27

## 2015-05-18 MED ORDER — FLUTICASONE PROPIONATE 50 MCG/ACT NA SUSP: 2.0000 | Freq: Every day | NASAL | Status: DC

## 2015-05-18 MED ORDER — LORATADINE 10 MG PO TABS: 10.0000 mg | ORAL_TABLET | Freq: Every day | ORAL | Status: DC

## 2015-05-18 MED ORDER — MONTELUKAST SODIUM 10 MG PO TABS: ORAL_TABLET | ORAL | Status: DC

## 2015-05-18 MED ORDER — CARBAMIDE PEROXIDE 6.5 % OT SOLN: 5.0000 [drp] | Freq: Two times a day (BID) | OTIC | Status: DC

## 2015-05-18 MED ORDER — FLUTICASONE-SALMETEROL 100-50 MCG/DOSE IN AEPB: 1.0000 | INHALATION_SPRAY | Freq: Two times a day (BID) | RESPIRATORY_TRACT | Status: DC

## 2015-05-18 MED ORDER — ESCITALOPRAM OXALATE 10 MG PO TABS: 10.0000 mg | ORAL_TABLET | Freq: Every day | ORAL | Status: DC

## 2015-05-18 MED ORDER — VARENICLINE TARTRATE 0.5 MG X 11 & 1 MG X 42 PO MISC: ORAL | Status: DC

## 2015-05-18 NOTE — Progress Notes (Signed)
Subjective:  Patient ID: [name] [name], female    DOB: 05/31/1956  Age: 59 y.o. MRN: [id number]  CC: Cough   HPI JAIMY KLIETHERMES presents for    1. Cough: non productive cough with wheezing. X 24 -48 hrs. No fever. No known sick contacts. Used albuterol neb x 3 yesterday. Last dose 4 AM. Has hx of asthma, COPD, emphysema, current smoker 1/2-1 PPD. Not taking antihistamine, Singulair or advair. Has albuterol neb solution at home. Needs albuterol inhaler.   2. Smoking: desires to quit. Using vape. Smoking 1/2 -1 PPD. Has not used chantix in the past. Has hx of depression. Treated at [facility]. Denies SI.   3. Mood: treated at [facility]. On lexapro. Out of lexapro x 2 weeks or so. No longer taking risperadal. Requesting small dose of klonopin to help with nervousness as she is caring for her mother with Alzheimer's.   Social History  Substance Use Topics  . Smoking status: Current Every Day Smoker -- 1.00 packs/day for 44 years    Types: Cigarettes  . Smokeless tobacco: Not on file  . Alcohol Use: Yes     Comment: alcohol last night   Outpatient Prescriptions Prior to Visit  Medication Sig Dispense Refill  . ADVAIR DISKUS 100-50 MCG/DOSE AEPB INHALE 1 PUFF INTO THE LUNGS 2 TIMES DAILY. (MAP) 180 each 0  . aspirin 325 MG tablet Take 1 tablet (325 mg total) by mouth daily.    Marland Kitchen atorvastatin (LIPITOR) 40 MG tablet Take 1 tablet by mouth daily. <Please make appointment for future refills> 90 tablet 3  . Calcium 600 MG tablet  60 tablet   . cyclobenzaprine (FLEXERIL) 10 MG tablet Take 1 tablet (10 mg total) by mouth 3 (three) times daily as needed for muscle spasms. 90 tablet 3  . ergocalciferol (VITAMIN D2) 50000 UNITS capsule Take 1 capsule (50,000 Units total) by mouth once a week. 8 capsule 0  . escitalopram (LEXAPRO) 10 MG tablet Take 1 tablet (10 mg total) by mouth daily. 30 tablet 0  . esomeprazole (NEXIUM) 40 MG capsule Take 1 capsule (40 mg total) by mouth daily. 30 capsule 3  .  fluticasone (FLONASE) 50 MCG/ACT nasal spray Place 2 sprays into both nostrils at bedtime. 16 g 3  . furosemide (LASIX) 20 MG tablet Take 1 tablet (20 mg total) by mouth daily. 90 tablet 3  . gabapentin (NEURONTIN) 600 MG tablet Take 1 tablet (600 mg total) by mouth 4 (four) times daily. 120 tablet 0  . ipratropium (ATROVENT) 0.02 % nebulizer solution Take 2.5 mLs (0.5 mg total) by nebulization every 4 (four) hours as needed for wheezing or shortness of breath. 75 mL 12  . labetalol (NORMODYNE) 100 MG tablet Take 1 tablet (100 mg total) by mouth 2 (two) times daily. 180 tablet 3  . levothyroxine (SYNTHROID, LEVOTHROID) 100 MCG tablet Take 1 tablet (100 mcg total) by mouth daily. 90 tablet 3  . losartan (COZAAR) 50 MG tablet Take 1 tablet (50 mg total) by mouth daily. 90 tablet 3  . montelukast (SINGULAIR) 10 MG tablet TAKE 1 TABLET BY MOUTH AT BEDTIME. 90 tablet 3  . omeprazole (PRILOSEC) 40 MG capsule Take 1 capsule (40 mg total) by mouth daily. 90 capsule 3  . prasugrel (EFFIENT) 10 MG TABS tablet Take 1 tablet (10 mg total) by mouth daily. 30 tablet   . predniSONE (DELTASONE) 20 MG tablet Take 1 tablet (20 mg total) by mouth 1 day or 1 dose. Take 3  tablet a day for 3 days, 2 a day for 3 days then 1 a day for 3 days/ 18 tablet 0  . risperiDONE (RISPERDAL) 1 MG tablet Take 1 mg by mouth at bedtime.    . traMADol (ULTRAM) 50 MG tablet Take 1 tablet (50 mg total) by mouth every 12 (twelve) hours as needed (cough). 60 tablet 0  . VENTOLIN HFA 108 (90 BASE) MCG/ACT inhaler INHALE 2 PUFFS INTO THE LUNGS EVERY 6 HOURS AS NEEDED FOR SHORTNESS OF BREATH. (MAP) 54 g 0   No facility-administered medications prior to visit.    ROS Review of Systems  Constitutional: Negative for fever and chills.  Eyes: Negative for visual disturbance.  Respiratory: Positive for cough and wheezing. Negative for shortness of breath.   Cardiovascular: Negative for chest pain.  Gastrointestinal: Negative for abdominal pain  and blood in stool.  Musculoskeletal: Negative for back pain and arthralgias.  Skin: Negative for rash.  Allergic/Immunologic: Negative for immunocompromised state.  Hematological: Negative for adenopathy. Does not bruise/bleed easily.  Psychiatric/Behavioral: Positive for dysphoric mood. Negative for suicidal ideas. The patient is nervous/anxious.     Objective:  BP 123/83 mmHg  Pulse 87  Temp(Src) 98.5 F (36.9 C) (Oral)  Resp 18  Ht 5\' 1"  (1.549 m)  Wt 146 lb 3.2 oz (66.316 kg)  BMI 27.64 kg/m2  SpO2 99%  BP/Weight 05/18/2015 09/28/2014 8/84/1660  Systolic BP 630 160 109  Diastolic BP 83 67 85  Wt. (Lbs) 146.2 139 144  BMI 27.64 23.5 27.22  Some encounter information is confidential and restricted. Go to Review Flowsheets activity to see all data.   Physical Exam  Constitutional: She is oriented to person, place, and time. She appears well-developed and well-nourished. No distress.  HENT:  Head: Normocephalic and atraumatic.  Cerumen in wax   Cardiovascular: Normal rate, regular rhythm, normal heart sounds and intact distal pulses.   Pulmonary/Chest: Effort normal. She has wheezes.  Musculoskeletal: She exhibits no edema.  Neurological: She is alert and oriented to person, place, and time.  Skin: Skin is warm and dry. No rash noted.  Psychiatric: She has a normal mood and affect.   Depression screen PHQ 2/9 05/18/2015  Decreased Interest 1  Down, Depressed, Hopeless 1  PHQ - 2 Score 2  Altered sleeping 0  Tired, decreased energy 1  Change in appetite 1  Feeling bad or failure about yourself  0  Trouble concentrating 0  Moving slowly or fidgety/restless 0  Suicidal thoughts 0  PHQ-9 Score 4   GAD 7 : Generalized Anxiety Score 05/18/2015  Nervous, Anxious, on Edge 2  Control/stop worrying 1  Worry too much - different things 2  Trouble relaxing 2  Restless 1  Easily annoyed or irritable 2  Afraid - awful might happen 0  Total GAD 7 Score 10      Assessment  & Plan:   Problem List Items Addressed This Visit    Asthma, chronic (Chronic)   Relevant Medications   predniSONE (DELTASONE) 20 MG tablet   montelukast (SINGULAIR) 10 MG tablet   loratadine (CLARITIN) 10 MG tablet   fluticasone (FLONASE) 50 MCG/ACT nasal spray   albuterol (VENTOLIN HFA) 108 (90 BASE) MCG/ACT inhaler   Fluticasone-Salmeterol (ADVAIR DISKUS) 100-50 MCG/DOSE AEPB   COPD exacerbation (HCC) (Chronic)   Relevant Medications   predniSONE (DELTASONE) 20 MG tablet   montelukast (SINGULAIR) 10 MG tablet   loratadine (CLARITIN) 10 MG tablet   fluticasone (FLONASE) 50 MCG/ACT nasal spray  varenicline (CHANTIX STARTING MONTH PAK) 0.5 MG X 11 & 1 MG X 42 tablet   albuterol (VENTOLIN HFA) 108 (90 BASE) MCG/ACT inhaler   doxycycline (VIBRA-TABS) 100 MG tablet   Fluticasone-Salmeterol (ADVAIR DISKUS) 100-50 MCG/DOSE AEPB   Emphysema lung (HCC) (Chronic)   Relevant Medications   predniSONE (DELTASONE) 20 MG tablet   montelukast (SINGULAIR) 10 MG tablet   loratadine (CLARITIN) 10 MG tablet   fluticasone (FLONASE) 50 MCG/ACT nasal spray   varenicline (CHANTIX STARTING MONTH PAK) 0.5 MG X 11 & 1 MG X 42 tablet   albuterol (VENTOLIN HFA) 108 (90 BASE) MCG/ACT inhaler   Fluticasone-Salmeterol (ADVAIR DISKUS) 100-50 MCG/DOSE AEPB   MDD (major depressive disorder) (HCC) - Primary (Chronic)   Relevant Medications   escitalopram (LEXAPRO) 10 MG tablet   TOBACCO ABUSE (Chronic)   Relevant Medications   varenicline (CHANTIX STARTING MONTH PAK) 0.5 MG X 11 & 1 MG X 42 tablet    Other Visit Diagnoses    Cerumen impaction, right        Relevant Medications    carbamide peroxide (DEBROX) 6.5 % otic solution       No orders of the defined types were placed in this encounter.    Follow-up: No Follow-up on file.   Boykin Nearing MD

## 2015-05-18 NOTE — Assessment & Plan Note (Signed)
A: depressed and at times anxious. Intermittently compliant with lexapro. Requesting klonopin P: Refilled lexapro Klonopin to be discussed with PCP at f/u once patient compliant with daily lexapro

## 2015-05-18 NOTE — Progress Notes (Signed)
Patient complains of sinus and chest cold which has been present for a week. Patient also complains of cramping in stomach intermittently.   Patient requesting refills on Advair, Lexapro, Singulair, Tramadol and Ventolin inhaler.

## 2015-05-18 NOTE — Assessment & Plan Note (Signed)
A; patient with COPD. Continues to smoke. Non compliant with controller meds. In mild exacerbation with cough, wheezing.  P: Prednisone x 5 days Doxy Refilled controller meds Smoking cessation, started chantix

## 2015-05-18 NOTE — Patient Instructions (Addendum)
[name] [name] was seen today for cough.  Diagnoses and all orders for this visit:  Moderate episode of recurrent major depressive disorder (HCC) -     escitalopram (LEXAPRO) 10 MG tablet; Take 1 tablet (10 mg total) by mouth daily.  COPD exacerbation (HCC) -     predniSONE (DELTASONE) 20 MG tablet; Take 2 tablets (40 mg total) by mouth daily with breakfast. For 5 days -     doxycycline (VIBRA-TABS) 100 MG tablet; Take 1 tablet (100 mg total) by mouth 2 (two) times daily.  Asthma, chronic, moderate persistent, with acute exacerbation -     montelukast (SINGULAIR) 10 MG tablet; TAKE 1 TABLET BY MOUTH AT BEDTIME. -     loratadine (CLARITIN) 10 MG tablet; Take 1 tablet (10 mg total) by mouth daily. -     fluticasone (FLONASE) 50 MCG/ACT nasal spray; Place 2 sprays into both nostrils at bedtime. -     albuterol (VENTOLIN HFA) 108 (90 BASE) MCG/ACT inhaler; Inhale 1 puff into the lungs every 6 (six) hours as needed for wheezing or shortness of breath.  TOBACCO ABUSE -     varenicline (CHANTIX STARTING MONTH PAK) 0.5 MG X 11 & 1 MG X 42 tablet; Take one 0.5 mg tablet by mouth once daily for 3 days, then increase to one 0.5 mg tablet twice daily for 4 days, then increase to one 1 mg tablet twice daily.  Cerumen impaction, right -     carbamide peroxide (DEBROX) 6.5 % otic solution; Place 5 drops into both ears 2 (two) times daily. For 5 days   F/u 1 months with Dr. Doreene Burke for smoking cessation and to discuss ? About klonopin  Dr. Adrian Blackwater

## 2015-06-15 ENCOUNTER — Other Ambulatory Visit: Payer: Self-pay | Admitting: Internal Medicine

## 2015-06-22 ENCOUNTER — Other Ambulatory Visit: Payer: Self-pay | Admitting: Internal Medicine

## 2015-06-22 ENCOUNTER — Encounter: Payer: Self-pay | Admitting: Internal Medicine

## 2015-06-22 ENCOUNTER — Ambulatory Visit: Payer: Self-pay | Attending: Internal Medicine | Admitting: Internal Medicine

## 2015-06-22 VITALS — BP 128/80 | HR 74 | Temp 98.5°F | Resp 18 | Ht 61.0 in | Wt 145.4 lb

## 2015-06-22 DIAGNOSIS — E079 Disorder of thyroid, unspecified: Secondary | ICD-10-CM | POA: Insufficient documentation

## 2015-06-22 DIAGNOSIS — Z79899 Other long term (current) drug therapy: Secondary | ICD-10-CM | POA: Insufficient documentation

## 2015-06-22 DIAGNOSIS — J449 Chronic obstructive pulmonary disease, unspecified: Secondary | ICD-10-CM | POA: Insufficient documentation

## 2015-06-22 DIAGNOSIS — G8929 Other chronic pain: Secondary | ICD-10-CM

## 2015-06-22 DIAGNOSIS — F411 Generalized anxiety disorder: Secondary | ICD-10-CM

## 2015-06-22 DIAGNOSIS — F5104 Psychophysiologic insomnia: Secondary | ICD-10-CM | POA: Insufficient documentation

## 2015-06-22 DIAGNOSIS — F329 Major depressive disorder, single episode, unspecified: Secondary | ICD-10-CM | POA: Insufficient documentation

## 2015-06-22 DIAGNOSIS — I2510 Atherosclerotic heart disease of native coronary artery without angina pectoris: Secondary | ICD-10-CM | POA: Insufficient documentation

## 2015-06-22 DIAGNOSIS — Z885 Allergy status to narcotic agent status: Secondary | ICD-10-CM | POA: Insufficient documentation

## 2015-06-22 DIAGNOSIS — Z7982 Long term (current) use of aspirin: Secondary | ICD-10-CM | POA: Insufficient documentation

## 2015-06-22 DIAGNOSIS — I252 Old myocardial infarction: Secondary | ICD-10-CM | POA: Insufficient documentation

## 2015-06-22 DIAGNOSIS — E039 Hypothyroidism, unspecified: Secondary | ICD-10-CM | POA: Insufficient documentation

## 2015-06-22 DIAGNOSIS — Z91018 Allergy to other foods: Secondary | ICD-10-CM | POA: Insufficient documentation

## 2015-06-22 DIAGNOSIS — L409 Psoriasis, unspecified: Secondary | ICD-10-CM

## 2015-06-22 DIAGNOSIS — J439 Emphysema, unspecified: Secondary | ICD-10-CM

## 2015-06-22 DIAGNOSIS — J45909 Unspecified asthma, uncomplicated: Secondary | ICD-10-CM | POA: Insufficient documentation

## 2015-06-22 DIAGNOSIS — F17210 Nicotine dependence, cigarettes, uncomplicated: Secondary | ICD-10-CM | POA: Insufficient documentation

## 2015-06-22 DIAGNOSIS — F17200 Nicotine dependence, unspecified, uncomplicated: Secondary | ICD-10-CM

## 2015-06-22 DIAGNOSIS — J4541 Moderate persistent asthma with (acute) exacerbation: Secondary | ICD-10-CM

## 2015-06-22 DIAGNOSIS — I10 Essential (primary) hypertension: Secondary | ICD-10-CM

## 2015-06-22 DIAGNOSIS — M549 Dorsalgia, unspecified: Secondary | ICD-10-CM

## 2015-06-22 DIAGNOSIS — R079 Chest pain, unspecified: Secondary | ICD-10-CM | POA: Insufficient documentation

## 2015-06-22 DIAGNOSIS — E785 Hyperlipidemia, unspecified: Secondary | ICD-10-CM | POA: Insufficient documentation

## 2015-06-22 DIAGNOSIS — Z91040 Latex allergy status: Secondary | ICD-10-CM | POA: Insufficient documentation

## 2015-06-22 DIAGNOSIS — K219 Gastro-esophageal reflux disease without esophagitis: Secondary | ICD-10-CM | POA: Insufficient documentation

## 2015-06-22 LAB — CBC WITH DIFFERENTIAL/PLATELET
BASOS ABS: 0.1 10*3/uL (ref 0.0–0.1)
BASOS PCT: 1 % (ref 0–1)
EOS PCT: 4 % (ref 0–5)
Eosinophils Absolute: 0.3 10*3/uL (ref 0.0–0.7)
HCT: 42.8 % (ref 36.0–46.0)
HEMOGLOBIN: 14.6 g/dL (ref 12.0–15.0)
LYMPHS PCT: 29 % (ref 12–46)
Lymphs Abs: 2.1 10*3/uL (ref 0.7–4.0)
MCH: 31.6 pg (ref 26.0–34.0)
MCHC: 34.1 g/dL (ref 30.0–36.0)
MCV: 92.6 fL (ref 78.0–100.0)
MONO ABS: 0.8 10*3/uL (ref 0.1–1.0)
MPV: 9.5 fL (ref 8.6–12.4)
Monocytes Relative: 11 % (ref 3–12)
NEUTROS ABS: 4.1 10*3/uL (ref 1.7–7.7)
Neutrophils Relative %: 55 % (ref 43–77)
Platelets: 438 10*3/uL — ABNORMAL HIGH (ref 150–400)
RBC: 4.62 MIL/uL (ref 3.87–5.11)
RDW: 14.5 % (ref 11.5–15.5)
WBC: 7.4 10*3/uL (ref 4.0–10.5)

## 2015-06-22 LAB — LIPID PANEL
CHOL/HDL RATIO: 2.7 ratio (ref <=5.0)
Cholesterol: 156 mg/dL (ref 125–200)
HDL: 58 mg/dL (ref >=46)
LDL CALC: 71 mg/dL (ref <130)
Triglycerides: 134 mg/dL (ref <150)
VLDL: 27 mg/dL (ref <30)

## 2015-06-22 LAB — POCT GLYCOSYLATED HEMOGLOBIN (HGB A1C): Hemoglobin A1C: 6

## 2015-06-22 LAB — COMPLETE METABOLIC PANEL WITH GFR
ALT: 15 U/L (ref 6–29)
AST: 18 U/L (ref 10–35)
Albumin: 3.8 g/dL (ref 3.6–5.1)
Alkaline Phosphatase: 102 U/L (ref 33–130)
BUN: 7 mg/dL (ref 7–25)
CHLORIDE: 102 mmol/L (ref 98–110)
CO2: 32 mmol/L — ABNORMAL HIGH (ref 20–31)
CREATININE: 0.88 mg/dL (ref 0.50–1.05)
Calcium: 9.4 mg/dL (ref 8.6–10.4)
GFR, Est African American: 83 mL/min (ref >=60)
GFR, Est Non African American: 72 mL/min (ref >=60)
GLUCOSE: 95 mg/dL (ref 65–99)
POTASSIUM: 4.2 mmol/L (ref 3.5–5.3)
SODIUM: 140 mmol/L (ref 135–146)
Total Bilirubin: 0.3 mg/dL (ref 0.2–1.2)
Total Protein: 6.3 g/dL (ref 6.1–8.1)

## 2015-06-22 MED ORDER — FLUTICASONE-SALMETEROL 100-50 MCG/DOSE IN AEPB: 1.0000 | INHALATION_SPRAY | Freq: Two times a day (BID) | RESPIRATORY_TRACT | Status: DC

## 2015-06-22 MED ORDER — CLOBETASOL PROPIONATE 0.05 % EX CREA: 1.0000 "application " | TOPICAL_CREAM | Freq: Two times a day (BID) | CUTANEOUS | Status: DC

## 2015-06-22 MED ORDER — PREDNISONE 20 MG PO TABS: 20.0000 mg | ORAL_TABLET | Freq: Every day | ORAL | Status: DC

## 2015-06-22 MED ORDER — CYCLOBENZAPRINE HCL 10 MG PO TABS: 10.0000 mg | ORAL_TABLET | Freq: Three times a day (TID) | ORAL | Status: DC | PRN

## 2015-06-22 MED ORDER — LORATADINE 10 MG PO TABS: 10.0000 mg | ORAL_TABLET | Freq: Every day | ORAL | Status: DC

## 2015-06-22 MED ORDER — CLONAZEPAM 0.5 MG PO TABS: 0.5000 mg | ORAL_TABLET | Freq: Every evening | ORAL | Status: DC | PRN

## 2015-06-22 NOTE — Patient Instructions (Signed)
Smoking Cessation, Tips for Success If you are ready to quit smoking, congratulations! You have chosen to help yourself be healthier. Cigarettes bring nicotine, tar, carbon monoxide, and other irritants into your body. Your lungs, heart, and blood vessels will be able to work better without these poisons. There are many different ways to quit smoking. Nicotine gum, nicotine patches, a nicotine inhaler, or nicotine nasal spray can help with physical craving. Hypnosis, support groups, and medicines help break the habit of smoking. WHAT THINGS CAN I DO TO MAKE QUITTING EASIER?  Here are some tips to help you quit for good:  Pick a date when you will quit smoking completely. Tell all of your friends and family about your plan to quit on that date.  Do not try to slowly cut down on the number of cigarettes you are smoking. Pick a quit date and quit smoking completely starting on that day.  Throw away all cigarettes.   Clean and remove all ashtrays from your home, work, and car.  On a card, write down your reasons for quitting. Carry the card with you and read it when you get the urge to smoke.  Cleanse your body of nicotine. Drink enough water and fluids to keep your urine clear or pale yellow. Do this after quitting to flush the nicotine from your body.  Learn to predict your moods. Do not let a bad situation be your excuse to have a cigarette. Some situations in your life might tempt you into wanting a cigarette.  Never have "just one" cigarette. It leads to wanting another and another. Remind yourself of your decision to quit.  Change habits associated with smoking. If you smoked while driving or when feeling stressed, try other activities to replace smoking. Stand up when drinking your coffee. Brush your teeth after eating. Sit in a different chair when you read the paper. Avoid alcohol while trying to quit, and try to drink fewer caffeinated beverages. Alcohol and caffeine may urge you to  smoke.  Avoid foods and drinks that can trigger a desire to smoke, such as sugary or spicy foods and alcohol.  Ask people who smoke not to smoke around you.  Have something planned to do right after eating or having a cup of coffee. For example, plan to take a walk or exercise.  Try a relaxation exercise to calm you down and decrease your stress. Remember, you may be tense and nervous for the first 2 weeks after you quit, but this will pass.  Find new activities to keep your hands busy. Play with a pen, coin, or rubber band. Doodle or draw things on paper.  Brush your teeth right after eating. This will help cut down on the craving for the taste of tobacco after meals. You can also try mouthwash.   Use oral substitutes in place of cigarettes. Try using lemon drops, carrots, cinnamon sticks, or chewing gum. Keep them handy so they are available when you have the urge to smoke.  When you have the urge to smoke, try deep breathing.  Designate your home as a nonsmoking area.  If you are a heavy smoker, ask your health care provider about a prescription for nicotine chewing gum. It can ease your withdrawal from nicotine.  Reward yourself. Set aside the cigarette money you save and buy yourself something nice.  Look for support from others. Join a support group or smoking cessation program. Ask someone at home or at work to help you with your plan   to quit smoking.  Always ask yourself, "Do I need this cigarette or is this just a reflex?" Tell yourself, "Today, I choose not to smoke," or "I do not want to smoke." You are reminding yourself of your decision to quit.  Do not replace cigarette smoking with electronic cigarettes (commonly called e-cigarettes). The safety of e-cigarettes is unknown, and some may contain harmful chemicals.  If you relapse, do not give up! Plan ahead and think about what you will do the next time you get the urge to smoke. HOW WILL I FEEL WHEN I QUIT SMOKING? You  may have symptoms of withdrawal because your body is used to nicotine (the addictive substance in cigarettes). You may crave cigarettes, be irritable, feel very hungry, cough often, get headaches, or have difficulty concentrating. The withdrawal symptoms are only temporary. They are strongest when you first quit but will go away within 10-14 days. When withdrawal symptoms occur, stay in control. Think about your reasons for quitting. Remind yourself that these are signs that your body is healing and getting used to being without cigarettes. Remember that withdrawal symptoms are easier to treat than the major diseases that smoking can cause.  Even after the withdrawal is over, expect periodic urges to smoke. However, these cravings are generally short lived and will go away whether you smoke or not. Do not smoke! WHAT RESOURCES ARE AVAILABLE TO HELP ME QUIT SMOKING? Your health care provider can direct you to community resources or hospitals for support, which may include:  Group support.  Education.  Hypnosis.  Therapy.   This information is not intended to replace advice given to you by your health care provider. Make sure you discuss any questions you have with your health care provider.   Document Released: 03/29/2004 Document Revised: 07/22/2014 Document Reviewed: 12/17/2012 Elsevier Interactive Patient Education 2016 Elsevier Inc. Generalized Anxiety Disorder Generalized anxiety disorder (GAD) is a mental disorder. It interferes with life functions, including relationships, work, and school. GAD is different from normal anxiety, which everyone experiences at some point in their lives in response to specific life events and activities. Normal anxiety actually helps [place] prepare for and get through these life events and activities. Normal anxiety goes away after the event or activity is over.  GAD causes anxiety that is not necessarily related to specific events or activities. It also causes  excess anxiety in proportion to specific events or activities. The anxiety associated with GAD is also difficult to control. GAD can vary from mild to severe. People with severe GAD can have intense waves of anxiety with physical symptoms (panic attacks).  SYMPTOMS The anxiety and worry associated with GAD are difficult to control. This anxiety and worry are related to many life events and activities and also occur more days than not for 6 months or longer. People with GAD also have three or more of the following symptoms (one or more in children):  Restlessness.   Fatigue.  Difficulty concentrating.   Irritability.  Muscle tension.  Difficulty sleeping or unsatisfying sleep. DIAGNOSIS GAD is diagnosed through an assessment by your health care provider. Your health care provider will ask you questions aboutyour mood,physical symptoms, and events in your life. Your health care provider may ask you about your medical history and use of alcohol or drugs, including prescription medicines. Your health care provider may also do a physical exam and blood tests. Certain medical conditions and the use of certain substances can cause symptoms similar to those associated with GAD.  Your health care provider may refer you to a mental health specialist for further evaluation. TREATMENT The following therapies are usually used to treat GAD:   Medication. Antidepressant medication usually is prescribed for long-term daily control. Antianxiety medicines may be added in severe cases, especially when panic attacks occur.   Talk therapy (psychotherapy). Certain types of talk therapy can be helpful in treating GAD by providing support, education, and guidance. A form of talk therapy called cognitive behavioral therapy can teach you healthy ways to think about and react to daily life events and activities.  Stress managementtechniques. These include yoga, meditation, and exercise and can be very helpful when  they are practiced regularly. A mental health specialist can help determine which treatment is best for you. Some people see improvement with one therapy. However, other people require a combination of therapies.   This information is not intended to replace advice given to you by your health care provider. Make sure you discuss any questions you have with your health care provider.   Document Released: 10/26/2012 Document Revised: 07/22/2014 Document Reviewed: 10/26/2012 Elsevier Interactive Patient Education 2016 Elsevier Inc. Chronic Obstructive Pulmonary Disease Chronic obstructive pulmonary disease (COPD) is a common lung condition in which airflow from the lungs is limited. COPD is a general term that can be used to describe many different lung problems that limit airflow, including both chronic bronchitis and emphysema. If you have COPD, your lung function will probably never return to normal, but there are measures you can take to improve lung function and make yourself feel better. CAUSES   Smoking (common).  Exposure to secondhand smoke.  Genetic problems.  Chronic inflammatory lung diseases or recurrent infections. SYMPTOMS  Shortness of breath, especially with physical activity.  Deep, persistent (chronic) cough with a large amount of thick mucus.  Wheezing.  Rapid breaths (tachypnea).  Gray or bluish discoloration (cyanosis) of the skin, especially in your fingers, toes, or lips.  Fatigue.  Weight loss.  Frequent infections or episodes when breathing symptoms become much worse (exacerbations).  Chest tightness. DIAGNOSIS Your health care provider will take a medical history and perform a physical examination to diagnose COPD. Additional tests for COPD may include:  Lung (pulmonary) function tests.  Chest X-ray.  CT scan.  Blood tests. TREATMENT  Treatment for COPD may include:  Inhaler and nebulizer medicines. These help manage the symptoms of COPD and  make your breathing more comfortable.  Supplemental oxygen. Supplemental oxygen is only helpful if you have a low oxygen level in your blood.  Exercise and physical activity. These are beneficial for nearly all people with COPD.  Lung surgery or transplant.  Nutrition therapy to gain weight, if you are underweight.  Pulmonary rehabilitation. This may involve working with a team of health care providers and specialists, such as respiratory, occupational, and physical therapists. HOME CARE INSTRUCTIONS  Take all medicines (inhaled or pills) as directed by your health care provider.  Avoid over-the-counter medicines or cough syrups that dry up your airway (such as antihistamines) and slow down the elimination of secretions unless instructed otherwise by your health care provider.  If you are a smoker, the most important thing that you can do is stop smoking. Continuing to smoke will cause further lung damage and breathing trouble. Ask your health care provider for help with quitting smoking. He or she can direct you to community resources or hospitals that provide support.  Avoid exposure to irritants such as smoke, chemicals, and fumes that aggravate your  breathing.  Use oxygen therapy and pulmonary rehabilitation if directed by your health care provider. If you require home oxygen therapy, ask your health care provider whether you should purchase a pulse oximeter to measure your oxygen level at home.  Avoid contact with individuals who have a contagious illness.  Avoid extreme temperature and humidity changes.  Eat healthy foods. Eating smaller, more frequent meals and resting before meals may help you maintain your strength.  Stay active, but balance activity with periods of rest. Exercise and physical activity will help you maintain your ability to do things you want to do.  Preventing infection and hospitalization is very important when you have COPD. Make sure to receive all the  vaccines your health care provider recommends, especially the pneumococcal and influenza vaccines. Ask your health care provider whether you need a pneumonia vaccine.  Learn and use relaxation techniques to manage stress.  Learn and use controlled breathing techniques as directed by your health care provider. Controlled breathing techniques include:  Pursed lip breathing. Start by breathing in (inhaling) through your nose for 1 second. Then, purse your lips as if you were going to whistle and breathe out (exhale) through the pursed lips for 2 seconds.  Diaphragmatic breathing. Start by putting one hand on your abdomen just above your waist. Inhale slowly through your nose. The hand on your abdomen should move out. Then purse your lips and exhale slowly. You should be able to feel the hand on your abdomen moving in as you exhale.  Learn and use controlled coughing to clear mucus from your lungs. Controlled coughing is a series of short, progressive coughs. The steps of controlled coughing are: 1. Lean your head slightly forward. 2. Breathe in deeply using diaphragmatic breathing. 3. Try to hold your breath for 3 seconds. 4. Keep your mouth slightly open while coughing twice. 5. Spit any mucus out into a tissue. 6. Rest and repeat the steps once or twice as needed. SEEK MEDICAL CARE IF:  You are coughing up more mucus than usual.  There is a change in the color or thickness of your mucus.  Your breathing is more labored than usual.  Your breathing is faster than usual. SEEK IMMEDIATE MEDICAL CARE IF:  You have shortness of breath while you are resting.  You have shortness of breath that prevents you from:  Being able to talk.  Performing your usual physical activities.  You have chest pain lasting longer than 5 minutes.  Your skin color is more cyanotic than usual.  You measure low oxygen saturations for longer than 5 minutes with a pulse oximeter. MAKE SURE YOU:  Understand  these instructions.  Will watch your condition.  Will get help right away if you are not doing well or get worse.   This information is not intended to replace advice given to you by your health care provider. Make sure you discuss any questions you have with your health care provider.   Document Released: 04/10/2005 Document Revised: 07/22/2014 Document Reviewed: 02/25/2013 Elsevier Interactive Patient Education [employer].

## 2015-06-22 NOTE — Progress Notes (Signed)
Patient ID: [name] [name], female   DOB: 1956/02/03, 59 y.o.   MRN: [id number]   [name] [name], is a 59 y.o. female  [id number]  [id number]  DOB - 02-Oct-1955  Chief Complaint  Patient presents with  . Follow-up        Subjective:   [name] [name] is a 59 y.o. female with extensive medical history as listed below including coronary artery disease status post multiple stenting, dyslipidemia, hypertension, chronic rhinitis and sinusitis, chronic back pain, COPD, GERD, diverticulosis, generalized anxiety disorder and chronic insomnia here today for a follow up visit. Her major complaint today is pain in her wrist joint on the right, elbow and hip, she rates her pain at 8, relieved by over-the-counter pain medication. She has no history of fall, no injury or trauma. Patient has psoriasis and thinks this may be related to arthritis from psoriasis. She has no swelling or redness. She has no fever. No night sweats. Patient is also requesting refill of her medications. She takes Klonopin for anxiety disorder. She has been having more shortness of breath but no paroxysmal nocturnal dyspnea, no orthopnea, she coughs, productive whitish sputum. She denies chest pain. She can only walk about 1 block before she is stopping to rest and catch her breath. No leg swelling. Patient has No headache, No abdominal pain - No Nausea, No new weakness tingling or numbness. She is attempting to quit smoking but she still smokes about 1-2 cigarettes per day, she is currently on Chantix.  Problem  Psoriasis    ALLERGIES: Allergies  Allergen Reactions  . Other Anaphylaxis    avacdos  . Codeine Nausea Only  . Latex     REACTION: shortness of breath and rash    PAST MEDICAL HISTORY: Past Medical History  Diagnosis Date  . Thyroid disease   . Coronary artery disease   . Hernia   . Chronic back pain     herniated nucleus pulposus  . Hyperlipidemia     takes Lipitor daily  . Hypertension     takes Losartan  daily and Labetalol bid  . Heart attack ([place]) 2011  . Asthma   . COPD (chronic obstructive pulmonary disease) ([place])     early stages  . Shortness of breath     with exertion  . Bronchitis     couple of months ago  . Arthritis   . Joint pain   . Neck pain   . Bruises easily     pt is on Effient  . Psoriasis     elbows,knees,back  . H/O hiatal hernia   . GERD (gastroesophageal reflux disease)     takes Nexium daily  . Hemorrhoids   . Diverticulosis   . Slowing of urinary stream   . Hypothyroidism     takes Synthroid daily  . Depression     takes Klonopin daily  . Anxiety     takes Lexapro daily  . Insomnia     hydroxyzine prn  . Bartholin gland cyst 08/29/2011    MEDICATIONS AT HOME: Prior to Admission medications   Medication Sig Start Date End Date Taking? Authorizing Provider  albuterol (VENTOLIN HFA) 108 (90 BASE) MCG/ACT inhaler Inhale 1 puff into the lungs every 6 (six) hours as needed for wheezing or shortness of breath. 05/18/15  Yes Josalyn Funches, MD  atorvastatin (LIPITOR) 40 MG tablet Take 1 tablet by mouth daily. <Please make appointment for future refills> 08/09/14  Yes Tresa Garter, MD  Calcium 600 MG  tablet  07/14/13  Yes Patrecia Pour, NP  carbamide peroxide (DEBROX) 6.5 % otic solution Place 5 drops into both ears 2 (two) times daily. For 5 days 05/18/15  Yes Boykin Nearing, MD  cyclobenzaprine (FLEXERIL) 10 MG tablet Take 1 tablet (10 mg total) by mouth 3 (three) times daily as needed for muscle spasms. 06/22/15  Yes Tresa Garter, MD  doxycycline (VIBRA-TABS) 100 MG tablet Take 1 tablet (100 mg total) by mouth 2 (two) times daily. 05/18/15  Yes Josalyn Funches, MD  ergocalciferol (VITAMIN D2) 50000 UNITS capsule Take 1 capsule (50,000 Units total) by mouth once a week. 08/24/14  Yes Tresa Garter, MD  escitalopram (LEXAPRO) 10 MG tablet Take 1 tablet (10 mg total) by mouth daily. 05/18/15  Yes Josalyn Funches, MD  fluticasone (FLONASE) 50  MCG/ACT nasal spray Place 2 sprays into both nostrils at bedtime. 05/18/15  Yes Josalyn Funches, MD  Fluticasone-Salmeterol (ADVAIR DISKUS) 100-50 MCG/DOSE AEPB Inhale 1 puff into the lungs 2 (two) times daily. 06/22/15  Yes Tresa Garter, MD  furosemide (LASIX) 20 MG tablet Take 1 tablet (20 mg total) by mouth daily. 08/09/14  Yes Tresa Garter, MD  gabapentin (NEURONTIN) 600 MG tablet Take 1 tablet (600 mg total) by mouth 4 (four) times daily. 07/14/13  Yes Patrecia Pour, NP  ipratropium (ATROVENT) 0.02 % nebulizer solution Take 2.5 mLs (0.5 mg total) by nebulization every 4 (four) hours as needed for wheezing or shortness of breath. 08/19/13  Yes Gregor Hams, MD  labetalol (NORMODYNE) 100 MG tablet Take 1 tablet (100 mg total) by mouth 2 (two) times daily. 08/09/14  Yes Tresa Garter, MD  levothyroxine (SYNTHROID, LEVOTHROID) 100 MCG tablet Take 1 tablet (100 mcg total) by mouth daily. 08/16/14  Yes Tresa Garter, MD  loratadine (CLARITIN) 10 MG tablet Take 1 tablet (10 mg total) by mouth daily. 06/22/15  Yes Tresa Garter, MD  losartan (COZAAR) 50 MG tablet Take 1 tablet (50 mg total) by mouth daily. 08/09/14  Yes Tresa Garter, MD  montelukast (SINGULAIR) 10 MG tablet TAKE 1 TABLET BY MOUTH AT BEDTIME. 05/18/15  Yes Josalyn Funches, MD  omeprazole (PRILOSEC) 40 MG capsule Take 1 capsule (40 mg total) by mouth daily. 08/09/14  Yes Tresa Garter, MD  prasugrel (EFFIENT) 10 MG TABS tablet Take 1 tablet (10 mg total) by mouth daily. 07/14/13  Yes Patrecia Pour, NP  predniSONE (DELTASONE) 20 MG tablet Take 1 tablet (20 mg total) by mouth daily with breakfast. 06/22/15 07/01/15 Yes Fedora Knisely Essie Christine, MD  traMADol (ULTRAM) 50 MG tablet Take 1 tablet (50 mg total) by mouth every 12 (twelve) hours as needed (cough). 05/11/15  Yes Tresa Garter, MD  varenicline (CHANTIX STARTING MONTH PAK) 0.5 MG X 11 & 1 MG X 42 tablet Take one 0.5 mg tablet by mouth once daily for 3  days, then increase to one 0.5 mg tablet twice daily for 4 days, then increase to one 1 mg tablet twice daily. 05/18/15  Yes Boykin Nearing, MD  aspirin 325 MG tablet Take 1 tablet (325 mg total) by mouth daily. 07/14/13   Patrecia Pour, NP  clobetasol cream (TEMOVATE) [id number] % Apply 1 application topically 2 (two) times daily. 06/22/15   Tresa Garter, MD  clonazePAM (KLONOPIN) 0.5 MG tablet Take 1 tablet (0.5 mg total) by mouth at bedtime as needed for anxiety. 06/22/15   Tresa Garter, MD  Objective:   Filed Vitals:   06/22/15 0951  BP: 128/80  Pulse: 74  Temp: 98.5 F (36.9 C)  TempSrc: Oral  Resp: 18  Height: 5\' 1"  (1.549 m)  Weight: 145 lb 6.4 oz (65.953 kg)  SpO2: 95%    Exam General appearance : Awake, alert, not in any distress. Speech Clear. Not toxic looking HEENT: Atraumatic and Normocephalic, pupils equally reactive to light and accomodation Neck: supple, no JVD. No cervical lymphadenopathy.  Chest: Reduced air entry bilaterally, no added sounds  CVS: S1 S2 regular, no murmurs.  Abdomen: Bowel sounds present, Non tender and not distended with no gaurding, rigidity or rebound. Extremities: B/L Lower Ext shows no edema, both legs are warm to touch Neurology: Awake alert, and oriented X 3, CN II-XII intact, Non focal Skin: Psoriatic rashes on extensor surfaces of upper limbs  Data Review Lab Results  Component Value Date   HGBA1C 5.60 08/09/2014     Assessment & Plan   1. TOBACCO ABUSE  Sheyna was counseled on the dangers of tobacco use, and was advised to quit. Reviewed strategies to maximize success, including removing cigarettes and smoking materials from environment, stress management and support of family/friends.  2. Essential hypertension, benign  - Echocardiogram; Future  We have discussed target BP range and blood pressure goal. I have advised patient to check BP regularly and to call us back or report to clinic if the numbers are  consistently higher than 140/90. We discussed the importance of compliance with medical therapy and DASH diet recommended, consequences of uncontrolled hypertension discussed.   - continue current BP medications  3. Chronic back pain  - cyclobenzaprine (FLEXERIL) 10 MG tablet; Take 1 tablet (10 mg total) by mouth 3 (three) times daily as needed for muscle spasms.  Dispense: 90 tablet; Refill: 3  4. Asthma, chronic, moderate persistent, with acute exacerbation  - Fluticasone-Salmeterol (ADVAIR DISKUS) 100-50 MCG/DOSE AEPB; Inhale 1 puff into the lungs 2 (two) times daily.  Dispense: 60 each; Refill: 5 - loratadine (CLARITIN) 10 MG tablet; Take 1 tablet (10 mg total) by mouth daily.  Dispense: 30 tablet; Refill: 3   5. Pulmonary emphysema, unspecified emphysema type ([place])  - predniSONE (DELTASONE) 20 MG tablet; Take 1 tablet (20 mg total) by mouth daily with breakfast.  Dispense: 10 tablet; Refill: 0  6. Psoriasis  - clobetasol cream (TEMOVATE) 0.05 %; Apply 1 application topically 2 (two) times daily.  Dispense: 30 g; Refill: 0  7. Generalized anxiety disorder  - clonazePAM (KLONOPIN) 0.5 MG tablet; Take 1 tablet (0.5 mg total) by mouth at bedtime as needed for anxiety.  Dispense: 30 tablet; Refill: 1  Patient have been counseled extensively about nutrition and exercise  Return in about 3 months (around 09/20/2015) for COPD, Generalized Anxiety Disorder.  The patient was given clear instructions to go to ER or return to medical center if symptoms don't improve, worsen or new problems develop. The patient verbalized understanding. The patient was told to call to get lab results if they haven't heard anything in the next week.   This note has been created with Surveyor, quantity. Any transcriptional errors are unintentional.    Angelica Chessman, MD, [place], Karilyn Cota, [place] and [place],  [place]   06/22/2015, 10:36 AM

## 2015-06-22 NOTE — Progress Notes (Signed)
Patient here for Smoking Cessation FU  Patient complains of pain in her right wrist, elbow and hip. Pain is scaled at an 8. Patient has receieved no relief.  Patient would like refill on Claritin, Flexeril,. Prednisone and Klonopin (states [facility] will no longer fill)

## 2015-06-23 LAB — VITAMIN D 25 HYDROXY (VIT D DEFICIENCY, FRACTURES): VIT D 25 HYDROXY: 23 ng/mL — AB (ref 30–100)

## 2015-06-23 NOTE — Telephone Encounter (Signed)
Medical Assistant left message with person on patient's home and cell voicemail. Voicemail states to give a call back to [place] with [facility] at [phone].

## 2015-06-23 NOTE — Telephone Encounter (Signed)
-----   Message from Tresa Garter, MD sent at 06/23/2015 12:36 PM EST ----- Please inform patient her laboratory results are mostly within normal limit. Vitamin D level is however slightly low. Advise patient to get a vitamin D supplement over-the-counter and take 1 capsule daily.

## 2015-06-27 ENCOUNTER — Ambulatory Visit (HOSPITAL_COMMUNITY): Payer: Self-pay

## 2015-07-05 ENCOUNTER — Other Ambulatory Visit: Payer: Self-pay | Admitting: Internal Medicine

## 2015-07-05 DIAGNOSIS — J4541 Moderate persistent asthma with (acute) exacerbation: Secondary | ICD-10-CM

## 2015-07-05 MED ORDER — FLUTICASONE-SALMETEROL 100-50 MCG/DOSE IN AEPB: 1.0000 | INHALATION_SPRAY | Freq: Two times a day (BID) | RESPIRATORY_TRACT | Status: DC

## 2015-07-20 MED FILL — LOSARTAN POTASSIUM 50 MG TA: 50 | 30 days supply | Qty: 30 | Fill #10

## 2015-07-20 MED FILL — ?FUROSEMIDE 20 MG TABLET: 20 | 30 days supply | Qty: 30 | Fill #10

## 2015-07-20 MED FILL — ?ATORVASTATIN 40MG TABLET: 40 | 30 days supply | Qty: 30 | Fill #5

## 2015-07-20 MED FILL — MONTELUKAST SOD 10 MG TAB: 10 | 30 days supply | Qty: 30 | Fill #2

## 2015-07-20 MED FILL — ?OMEPRAZOLE DR 20 MG CAPSUL: 20 | 30 days supply | Qty: 60 | Fill #5

## 2015-07-20 MED FILL — LABETALOL HCL 100 MG TABLET: 100 | 30 days supply | Qty: 60 | Fill #8

## 2015-08-15 ENCOUNTER — Other Ambulatory Visit: Payer: Self-pay | Admitting: Internal Medicine

## 2015-08-21 ENCOUNTER — Emergency Department (HOSPITAL_COMMUNITY)
Admission: EM | Admit: 2015-08-21 | Discharge: 2015-08-21 | Disposition: A | Discharge status: Home / Self Care | Payer: No Typology Code available for payment source | Source: Home / Self Care | Attending: Family Medicine | Admitting: Family Medicine

## 2015-08-21 ENCOUNTER — Other Ambulatory Visit: Payer: Self-pay | Admitting: Internal Medicine

## 2015-08-21 ENCOUNTER — Encounter (HOSPITAL_COMMUNITY): Payer: Self-pay | Admitting: *Deleted

## 2015-08-21 ENCOUNTER — Emergency Department (INDEPENDENT_AMBULATORY_CARE_PROVIDER_SITE_OTHER): Payer: No Typology Code available for payment source

## 2015-08-21 DIAGNOSIS — J45901 Unspecified asthma with (acute) exacerbation: Secondary | ICD-10-CM

## 2015-08-21 DIAGNOSIS — H7291 Unspecified perforation of tympanic membrane, right ear: Secondary | ICD-10-CM

## 2015-08-21 MED ORDER — LEVOFLOXACIN 500 MG PO TABS: 500.0000 mg | ORAL_TABLET | Freq: Every day | ORAL | Status: DC

## 2015-08-21 MED ORDER — ALBUTEROL SULFATE (2.5 MG/3ML) 0.083% IN NEBU
INHALATION_SOLUTION | RESPIRATORY_TRACT | Status: AC
  Filled 2015-08-21: qty 6

## 2015-08-21 MED ORDER — METHYLPREDNISOLONE SODIUM SUCC 125 MG IJ SOLR
INTRAMUSCULAR | Status: AC
  Filled 2015-08-21: qty 2

## 2015-08-21 MED ORDER — METHYLPREDNISOLONE SODIUM SUCC 125 MG IJ SOLR
125.0000 mg | Freq: Once | INTRAMUSCULAR | Status: AC
  Administered 2015-08-21: 125 mg via INTRAMUSCULAR

## 2015-08-21 MED ORDER — IPRATROPIUM BROMIDE 0.02 % IN SOLN
RESPIRATORY_TRACT | Status: AC
  Filled 2015-08-21: qty 2.5

## 2015-08-21 MED ORDER — ALBUTEROL SULFATE (2.5 MG/3ML) 0.083% IN NEBU
5.0000 mg | INHALATION_SOLUTION | Freq: Once | RESPIRATORY_TRACT | Status: AC
  Administered 2015-08-21: 5 mg via RESPIRATORY_TRACT

## 2015-08-21 MED ORDER — IPRATROPIUM BROMIDE 0.02 % IN SOLN
0.5000 mg | Freq: Once | RESPIRATORY_TRACT | Status: AC
  Administered 2015-08-21: 0.5 mg via RESPIRATORY_TRACT

## 2015-08-21 MED FILL — VENTOLIN HFA 90 MCG INHALER: 108 (90 BAS | 25 days supply | Qty: 18 | Fill #1

## 2015-08-21 MED FILL — ?ATORVASTATIN 40MG TABLET: 40 | 30 days supply | Qty: 30 | Fill #0

## 2015-08-21 MED FILL — ?MONTELUKAST SOD 10 MG TAB: 10 | 30 days supply | Qty: 30 | Fill #3

## 2015-08-21 MED FILL — LABETALOL HCL 100 MG TABLET: 100 | 30 days supply | Qty: 60 | Fill #0

## 2015-08-21 MED FILL — ?LEVOTHYROXINE 100 MCG TAB: 100 | 30 days supply | Qty: 30 | Fill #0

## 2015-08-21 MED FILL — LOSARTAN POTASSIUM 50 MG TA: 50 | 90 days supply | Qty: 90 | Fill #0

## 2015-08-21 MED FILL — levoFLOXacin 500 MG TABS: 500 | 7 days supply | Qty: 7 | Fill #0

## 2015-08-21 MED FILL — ?FUROSEMIDE 20 MG TABLET: 20 | 30 days supply | Qty: 30 | Fill #0

## 2015-08-21 NOTE — Discharge Instructions (Signed)
Take all of medicine, drink plenty of fluids, keep ear dry, no more smoking, see your doctor if further problems

## 2015-08-21 NOTE — ED Notes (Signed)
Pt   Reports   -       Shortness  Of  Breath        With a  Cough   And  Wheezing          Symptoms  Started    X  1  Week  Ago  She  States she  May  Have  Injured  Her  r  Ear   Using a   q tip     Se  Days  Ago  Pt has  Copd  And  Is  A   Smoker

## 2015-08-21 NOTE — ED Provider Notes (Signed)
CSN: [id number]     Arrival date & time 08/21/15  1259 History   First MD Initiated Contact with Patient 08/21/15 1316     Chief Complaint  Patient presents with  . Shortness of Breath   (Consider location/radiation/quality/duration/timing/severity/associated sxs/prior Treatment) Patient is a 60 y.o. female presenting with shortness of breath. The history is provided by the patient.  Shortness of Breath Severity:  Moderate Onset quality:  Gradual Duration:  1 week Progression:  Worsening Chronicity:  New Context: smoke exposure   Relieved by:  None tried Worsened by:  Nothing tried Ineffective treatments:  None tried Associated symptoms: cough, ear pain and wheezing   Associated symptoms: no fever   Risk factors: tobacco use     Past Medical History  Diagnosis Date  . Thyroid disease   . Coronary artery disease   . Hernia   . Chronic back pain     herniated nucleus pulposus  . Hyperlipidemia     takes Lipitor daily  . Hypertension     takes Losartan daily and Labetalol bid  . Heart attack ([place]) 2011  . Asthma   . COPD (chronic obstructive pulmonary disease) ([place])     early stages  . Shortness of breath     with exertion  . Bronchitis     couple of months ago  . Arthritis   . Joint pain   . Neck pain   . Bruises easily     pt is on Effient  . Psoriasis     elbows,knees,back  . H/O hiatal hernia   . GERD (gastroesophageal reflux disease)     takes Nexium daily  . Hemorrhoids   . Diverticulosis   . Slowing of urinary stream   . Hypothyroidism     takes Synthroid daily  . Depression     takes Klonopin daily  . Anxiety     takes Lexapro daily  . Insomnia     hydroxyzine prn  . Bartholin gland cyst 08/29/2011   Past Surgical History  Procedure Laterality Date  . Abdominal hysterectomy  1977  . Laparoscopy  1977    exploratory  . Tonsillectomy      as a child  . Coronary angioplasty with stent placement  2011    x 2  . Colonoscopy    .  Esophagogastroduodenscoy    . Lumbar laminectomy/decompression microdiscectomy  08/05/2011    Procedure: LUMBAR LAMINECTOMY/DECOMPRESSION MICRODISCECTOMY;  Surgeon: Otilio Connors, MD;  Location: [facility];  Service: Neurosurgery;  Laterality: Right;  Right Lumbar four-five extraforaminal discectomy   Family History  Problem Relation Age of Onset  . Hypotension Neg Hx   . Malignant hyperthermia Neg Hx   . Pseudochol deficiency Neg Hx   . Anesthesia problems Daughter   . Heart disease Mother   . Hypertension Mother   . Stroke Mother   . Heart disease Father   . Hypertension Father   . Diabetes Father   . Cancer Maternal Aunt     breast  . Thyroid disease Paternal Aunt   . Heart disease Maternal Grandfather   . Cancer Paternal Grandmother     mouth   Social History  Substance Use Topics  . Smoking status: Current Every Day Smoker -- 1.00 packs/day for 44 years    Types: Cigarettes  . Smokeless tobacco: None  . Alcohol Use: Yes     Comment: alcohol last night   OB History    Gravida Para Term Preterm AB TAB SAB  Ectopic Multiple Living   1 1 1  0 0 0 0 0 0 1     Review of Systems  Constitutional: Negative for fever and chills.  HENT: Positive for congestion and ear pain.   Respiratory: Positive for cough, shortness of breath and wheezing.   Cardiovascular: Negative.   Genitourinary: Negative.   All other systems reviewed and are negative.   Allergies  Other; Codeine; and Latex  Home Medications   Prior to Admission medications   Medication Sig Start Date End Date Taking? Authorizing Provider  albuterol (VENTOLIN HFA) 108 (90 BASE) MCG/ACT inhaler Inhale 1 puff into the lungs every 6 (six) hours as needed for wheezing or shortness of breath. 05/18/15   Boykin Nearing, MD  aspirin 325 MG tablet Take 1 tablet (325 mg total) by mouth daily. 07/14/13   Patrecia Pour, NP  atorvastatin (LIPITOR) 40 MG tablet TAKE 1 TABLET BY MOUTH ONCE DAILY 08/15/15   Tresa Garter,  MD  Calcium 600 MG tablet  07/14/13   Patrecia Pour, NP  carbamide peroxide (DEBROX) 6.5 % otic solution Place 5 drops into both ears 2 (two) times daily. For 5 days 05/18/15   Boykin Nearing, MD  clobetasol cream (TEMOVATE) AB-123456789 % Apply 1 application topically 2 (two) times daily. 06/22/15   Tresa Garter, MD  clonazePAM (KLONOPIN) 0.5 MG tablet Take 1 tablet (0.5 mg total) by mouth at bedtime as needed for anxiety. 06/22/15   Tresa Garter, MD  cyclobenzaprine (FLEXERIL) 10 MG tablet Take 1 tablet (10 mg total) by mouth 3 (three) times daily as needed for muscle spasms. 06/22/15   Tresa Garter, MD  doxycycline (VIBRA-TABS) 100 MG tablet Take 1 tablet (100 mg total) by mouth 2 (two) times daily. 05/18/15   Boykin Nearing, MD  ergocalciferol (VITAMIN D2) 50000 UNITS capsule Take 1 capsule (50,000 Units total) by mouth once a week. 08/24/14   Tresa Garter, MD  escitalopram (LEXAPRO) 10 MG tablet Take 1 tablet (10 mg total) by mouth daily. 05/18/15   Josalyn Funches, MD  fluticasone (FLONASE) 50 MCG/ACT nasal spray Place 2 sprays into both nostrils at bedtime. 05/18/15   Josalyn Funches, MD  Fluticasone-Salmeterol (ADVAIR DISKUS) 100-50 MCG/DOSE AEPB Inhale 1 puff into the lungs 2 (two) times daily. 07/05/15   Tresa Garter, MD  furosemide (LASIX) 20 MG tablet TAKE 1 TABLET BY MOUTH ONCE DAILY 08/15/15   Tresa Garter, MD  gabapentin (NEURONTIN) 600 MG tablet Take 1 tablet (600 mg total) by mouth 4 (four) times daily. 07/14/13   Patrecia Pour, NP  ipratropium (ATROVENT) 0.02 % nebulizer solution Take 2.5 mLs (0.5 mg total) by nebulization every 4 (four) hours as needed for wheezing or shortness of breath. 08/19/13   Gregor Hams, MD  labetalol (NORMODYNE) 100 MG tablet Take 1 tablet (100 mg total) by mouth 2 (two) times daily. 08/09/14   Tresa Garter, MD  levofloxacin (LEVAQUIN) 500 MG tablet Take 1 tablet (500 mg total) by mouth daily. 08/21/15   Billy Fischer, MD   levothyroxine (SYNTHROID, LEVOTHROID) 100 MCG tablet Take 1 tablet (100 mcg total) by mouth daily. 08/16/14   Tresa Garter, MD  loratadine (CLARITIN) 10 MG tablet Take 1 tablet (10 mg total) by mouth daily. 06/22/15   Tresa Garter, MD  losartan (COZAAR) 50 MG tablet TAKE 1 TABLET BY MOUTH ONCE DAILY 08/15/15   Tresa Garter, MD  montelukast (SINGULAIR) 10 MG tablet TAKE  1 TABLET BY MOUTH AT BEDTIME. 05/18/15   Josalyn Funches, MD  omeprazole (PRILOSEC) 40 MG capsule Take 1 capsule (40 mg total) by mouth daily. 08/09/14   Tresa Garter, MD  prasugrel (EFFIENT) 10 MG TABS tablet Take 1 tablet (10 mg total) by mouth daily. 07/14/13   Patrecia Pour, NP  traMADol (ULTRAM) 50 MG tablet Take 1 tablet (50 mg total) by mouth every 12 (twelve) hours as needed (cough). 05/11/15   Tresa Garter, MD  varenicline (CHANTIX STARTING MONTH PAK) 0.5 MG X 11 & 1 MG X 42 tablet Take one 0.5 mg tablet by mouth once daily for 3 days, then increase to one 0.5 mg tablet twice daily for 4 days, then increase to one 1 mg tablet twice daily. 05/18/15   Boykin Nearing, MD   Meds Ordered and Administered this Visit   Medications  albuterol (PROVENTIL) (2.5 MG/3ML) 0.083% nebulizer solution 5 mg (not administered)  ipratropium (ATROVENT) nebulizer solution 0.5 mg (not administered)  methylPREDNISolone sodium succinate (SOLU-MEDROL) 125 mg/2 mL injection 125 mg (not administered)    BP 158/63 mmHg  Pulse 109  Temp(Src) 97.8 F (36.6 C) (Oral)  Resp 16  SpO2 96% No data found.   Physical Exam  Constitutional: She is oriented to person, place, and time. She appears well-developed and well-nourished. No distress.  HENT:  Right Ear: No drainage. Tympanic membrane is perforated. Decreased hearing is noted.  Left Ear: External ear normal.  Mouth/Throat: Oropharynx is clear and moist.  Eyes: Pupils are equal, round, and reactive to light.  Neck: Normal range of motion. Neck supple.   Pulmonary/Chest: She has wheezes. She has rales.  Neurological: She is alert and oriented to person, place, and time.  Skin: Skin is warm and dry.  Nursing note and vitals reviewed.   ED Course  Procedures (including critical care time)  Labs Review Labs Reviewed - No data to display  Imaging Review Dg Chest 2 View  08/21/2015  CLINICAL DATA:  COPD, asthma.  Cough, shortness of Breath EXAM: CHEST  2 VIEW COMPARISON:  04/22/2013 FINDINGS: Hyperinflation of the lungs, stable. Heart and mediastinal contours are within normal limits. No focal opacities or effusions. No acute bony abnormality. IMPRESSION: Hyperinflation.  No active cardiopulmonary disease. Electronically Signed   By: Rolm Baptise M.D.   On: 08/21/2015 13:37     Visual Acuity Review  Right Eye Distance:   Left Eye Distance:   Bilateral Distance:    Right Eye Near:   Left Eye Near:    Bilateral Near:         MDM   1. Asthmatic bronchitis with acute exacerbation   2. Perforated eardrum, right    Meds ordered this encounter  Medications  . albuterol (PROVENTIL) (2.5 MG/3ML) 0.083% nebulizer solution 5 mg    Sig:   . ipratropium (ATROVENT) nebulizer solution 0.5 mg    Sig:   . methylPREDNISolone sodium succinate (SOLU-MEDROL) 125 mg/2 mL injection 125 mg    Sig:   . levofloxacin (LEVAQUIN) 500 MG tablet    Sig: Take 1 tablet (500 mg total) by mouth daily.    Dispense:  7 tablet    Refill:  0       Billy Fischer, MD 08/21/15 [phone]

## 2015-09-05 ENCOUNTER — Emergency Department (HOSPITAL_COMMUNITY)
Admission: EM | Admit: 2015-09-05 | Discharge: 2015-09-05 | Disposition: A | Discharge status: Home / Self Care | Payer: No Typology Code available for payment source | Source: Home / Self Care | Attending: Emergency Medicine | Admitting: Emergency Medicine

## 2015-09-05 ENCOUNTER — Encounter (HOSPITAL_COMMUNITY): Payer: Self-pay | Admitting: Emergency Medicine

## 2015-09-05 DIAGNOSIS — H60321 Hemorrhagic otitis externa, right ear: Secondary | ICD-10-CM

## 2015-09-05 DIAGNOSIS — H9201 Otalgia, right ear: Secondary | ICD-10-CM

## 2015-09-05 MED ORDER — HYDROCODONE-ACETAMINOPHEN 5-325 MG PO TABS: 2.0000 | ORAL_TABLET | ORAL | Status: DC | PRN

## 2015-09-05 MED ORDER — OFLOXACIN 0.3 % OT SOLN: 5.0000 [drp] | Freq: Two times a day (BID) | OTIC | Status: DC

## 2015-09-05 NOTE — Discharge Instructions (Signed)
Follow up with [facility] within a week. Try the ear drops. They will break up the blood clot and also treat any infection that you having your ear. Try the Norco, this will help with the pain. Do not exceed 4 g of Tylenol from all sources in one day. Go to the emergency room if you start having redness over your face, fevers above 100.4, or for other concerns.

## 2015-09-05 NOTE — ED Provider Notes (Signed)
HPI  SUBJECTIVE:  [name] [name] is a 60 y.o. female who presents with right ear pain, decreased hearing since perforating her right eardrum with a Q-tip on 2/5. She was seen here on 2/6 for the same, found to have a perforated TM, advised to not get it wet while it healed. Patient states that it bled for about 3 days afterwards, and now reports decreased hearing, burning, stinging, aching, constant pain over her right face, down her neck. Sx worse with chewing, no alleviating factors. She has been taking Tylenol in addition to her usual medications of tramadol, Neurontin. She takes aspirin 325 mg daily. Past medical history of TMJ, states that this does not feel like her usual TMJ. Also chronic low back pain, asthma, MI, status post 6 stents, hypertension, hypercholesterolemia. No history of diabetes. PMD: Dr. Fidel Levy at [facility].   Past Medical History  Diagnosis Date  . Thyroid disease   . Coronary artery disease   . Hernia   . Chronic back pain     herniated nucleus pulposus  . Hyperlipidemia     takes Lipitor daily  . Hypertension     takes Losartan daily and Labetalol bid  . Heart attack ([place]) 2011  . Asthma   . COPD (chronic obstructive pulmonary disease) ([place])     early stages  . Shortness of breath     with exertion  . Bronchitis     couple of months ago  . Arthritis   . Joint pain   . Neck pain   . Bruises easily     pt is on Effient  . Psoriasis     elbows,knees,back  . H/O hiatal hernia   . GERD (gastroesophageal reflux disease)     takes Nexium daily  . Hemorrhoids   . Diverticulosis   . Slowing of urinary stream   . Hypothyroidism     takes Synthroid daily  . Depression     takes Klonopin daily  . Anxiety     takes Lexapro daily  . Insomnia     hydroxyzine prn  . Bartholin gland cyst 08/29/2011    Past Surgical History  Procedure Laterality Date  . Abdominal hysterectomy  1977  . Laparoscopy  1977    exploratory  .  Tonsillectomy      as a child  . Coronary angioplasty with stent placement  2011    x 2  . Colonoscopy    . Esophagogastroduodenscoy    . Lumbar laminectomy/decompression microdiscectomy  08/05/2011    Procedure: LUMBAR LAMINECTOMY/DECOMPRESSION MICRODISCECTOMY;  Surgeon: Otilio Connors, MD;  Location: [facility];  Service: Neurosurgery;  Laterality: Right;  Right Lumbar four-five extraforaminal discectomy    Family History  Problem Relation Age of Onset  . Hypotension Neg Hx   . Malignant hyperthermia Neg Hx   . Pseudochol deficiency Neg Hx   . Anesthesia problems Daughter   . Heart disease Mother   . Hypertension Mother   . Stroke Mother   . Heart disease Father   . Hypertension Father   . Diabetes Father   . Cancer Maternal Aunt     breast  . Thyroid disease Paternal Aunt   . Heart disease Maternal Grandfather   . Cancer Paternal Grandmother     mouth    Social History  Substance Use Topics  . Smoking status: Current Every Day Smoker -- 1.00 packs/day for 44 years    Types: Cigarettes  . Smokeless tobacco: None  .  Alcohol Use: Yes     Comment: alcohol last night    No current facility-administered medications for this encounter.  Current outpatient prescriptions:  .  albuterol (VENTOLIN HFA) 108 (90 BASE) MCG/ACT inhaler, Inhale 1 puff into the lungs every 6 (six) hours as needed for wheezing or shortness of breath., Disp: 8 g, Rfl: 3 .  aspirin 325 MG tablet, Take 1 tablet (325 mg total) by mouth daily., Disp: , Rfl:  .  atorvastatin (LIPITOR) 40 MG tablet, TAKE 1 TABLET BY MOUTH ONCE DAILY, Disp: 90 tablet, Rfl: 0 .  Calcium 600 MG tablet, , Disp: 60 tablet, Rfl:  .  clobetasol cream (TEMOVATE) [id number] %, Apply 1 application topically 2 (two) times daily., Disp: 30 g, Rfl: 0 .  clonazePAM (KLONOPIN) 0.5 MG tablet, Take 1 tablet (0.5 mg total) by mouth at bedtime as needed for anxiety., Disp: 30 tablet, Rfl: 1 .  cyclobenzaprine (FLEXERIL) 10 MG tablet, Take 1 tablet  (10 mg total) by mouth 3 (three) times daily as needed for muscle spasms., Disp: 90 tablet, Rfl: 3 .  ergocalciferol (VITAMIN D2) 50000 UNITS capsule, Take 1 capsule (50,000 Units total) by mouth once a week., Disp: 8 capsule, Rfl: 0 .  escitalopram (LEXAPRO) 10 MG tablet, Take 1 tablet (10 mg total) by mouth daily., Disp: 30 tablet, Rfl: 3 .  fluticasone (FLONASE) 50 MCG/ACT nasal spray, Place 2 sprays into both nostrils at bedtime., Disp: 16 g, Rfl: 3 .  Fluticasone-Salmeterol (ADVAIR DISKUS) 100-50 MCG/DOSE AEPB, Inhale 1 puff into the lungs 2 (two) times daily., Disp: 180 each, Rfl: 3 .  furosemide (LASIX) 20 MG tablet, TAKE 1 TABLET BY MOUTH ONCE DAILY, Disp: 90 tablet, Rfl: 0 .  gabapentin (NEURONTIN) 600 MG tablet, Take 1 tablet (600 mg total) by mouth 4 (four) times daily., Disp: 120 tablet, Rfl: 0 .  labetalol (NORMODYNE) 100 MG tablet, TAKE 1 TABLET BY MOUTH 2 TIMES DAILY., Disp: 180 tablet, Rfl: 0 .  levothyroxine (SYNTHROID, LEVOTHROID) 100 MCG tablet, TAKE 1 TABLET BY MOUTH DAILY, Disp: 90 tablet, Rfl: 0 .  loratadine (CLARITIN) 10 MG tablet, Take 1 tablet (10 mg total) by mouth daily., Disp: 30 tablet, Rfl: 3 .  losartan (COZAAR) 50 MG tablet, TAKE 1 TABLET BY MOUTH ONCE DAILY, Disp: 90 tablet, Rfl: 0 .  montelukast (SINGULAIR) 10 MG tablet, TAKE 1 TABLET BY MOUTH AT BEDTIME., Disp: 90 tablet, Rfl: 3 .  omeprazole (PRILOSEC) 40 MG capsule, Take 1 capsule (40 mg total) by mouth daily., Disp: 90 capsule, Rfl: 3 .  traMADol (ULTRAM) 50 MG tablet, Take 1 tablet (50 mg total) by mouth every 12 (twelve) hours as needed (cough)., Disp: 60 tablet, Rfl: 0 .  carbamide peroxide (DEBROX) 6.5 % otic solution, Place 5 drops into both ears 2 (two) times daily. For 5 days, Disp: 15 mL, Rfl: 0 .  HYDROcodone-acetaminophen (NORCO/VICODIN) 5-325 MG tablet, Take 2 tablets by mouth every 4 (four) hours as needed for moderate pain., Disp: 20 tablet, Rfl: 0 .  ipratropium (ATROVENT) 0.02 % nebulizer  solution, Take 2.5 mLs (0.5 mg total) by nebulization every 4 (four) hours as needed for wheezing or shortness of breath., Disp: 75 mL, Rfl: 12 .  ofloxacin (FLOXIN) 0.3 % otic solution, Place 5 drops into the right ear 2 (two) times daily. X 7 days, Disp: 10 mL, Rfl: 0 .  prasugrel (EFFIENT) 10 MG TABS tablet, Take 1 tablet (10 mg total) by mouth daily., Disp: 30 tablet, Rfl:  .  varenicline (CHANTIX STARTING MONTH PAK) 0.5 MG X 11 & 1 MG X 42 tablet, Take one 0.5 mg tablet by mouth once daily for 3 days, then increase to one 0.5 mg tablet twice daily for 4 days, then increase to one 1 mg tablet twice daily., Disp: 53 tablet, Rfl: 0  Allergies  Allergen Reactions  . Other Anaphylaxis    avacdos  . Codeine Nausea Only  . Latex     REACTION: shortness of breath and rash     ROS  As noted in HPI.   Physical Exam  BP 146/92 mmHg  Pulse 72  Temp(Src) 98.1 F (36.7 C) (Oral)  Resp 12  SpO2 99%  Constitutional: Well developed, well nourished, moderate painful distress Eyes:  EOMI, conjunctiva normal bilaterally HENT: Normocephalic, atraumatic,mucus membranes moist. L TM normal. R external ear canal completely blocked with clot, some dark blood oozing. + pain with traction on pinna, palpation of tragus. Oropharynx WNL.  Leymp: tenderness along R cerv LN, no palpable LN.  Respiratory: Normal inspiratory effort Cardiovascular: Normal rate GI: nondistended skin: No rash, skin intact Musculoskeletal: no deformities Neurologic: Alert & oriented x 3, no focal neuro deficits Psychiatric: Speech and behavior appropriate   ED Course   Medications - No data to display  No orders of the defined types were placed in this encounter.    No results found for this or any previous visit (from the past 24 hour(s)). No results found.  ED Clinical Impression  Otalgia of right ear  Otitis externa hemorrhagica, right   ED Assessment/Plan  Doubt malignant otitis as patient is not a  diabetic.  Discussed case with Dr. Erik Obey, ENT on call. Advised ofloxacin ear drops 5x day bid , follow up in one week. Will have her follow up in their clinic. We'll also add on Norco for pain management.  Discussed MDM, plan and followup with patient. Discussed sn/sx that should prompt return to the UC or ED. Patient agrees with plan.   *This clinic note was created using Dragon dictation software. Therefore, there may be occasional mistakes despite careful proofreading.  ?   Melynda Ripple, MD 09/07/15 [phone]

## 2015-09-05 NOTE — ED Notes (Signed)
C/o right ear pain onset x2 weeks... Seen here on 2/6 for similar sx Pain is 10/10... A&O x4; No acute distress.

## 2015-09-11 ENCOUNTER — Ambulatory Visit: Payer: No Typology Code available for payment source | Attending: Internal Medicine | Admitting: Internal Medicine

## 2015-09-11 ENCOUNTER — Encounter: Payer: Self-pay | Admitting: Internal Medicine

## 2015-09-11 ENCOUNTER — Encounter: Payer: Self-pay | Admitting: Clinical

## 2015-09-11 VITALS — BP 140/89 | HR 73 | Temp 98.6°F | Resp 16 | Ht 61.5 in | Wt 143.6 lb

## 2015-09-11 DIAGNOSIS — Z888 Allergy status to other drugs, medicaments and biological substances status: Secondary | ICD-10-CM | POA: Insufficient documentation

## 2015-09-11 DIAGNOSIS — Z79899 Other long term (current) drug therapy: Secondary | ICD-10-CM | POA: Insufficient documentation

## 2015-09-11 DIAGNOSIS — G8929 Other chronic pain: Secondary | ICD-10-CM | POA: Insufficient documentation

## 2015-09-11 DIAGNOSIS — E039 Hypothyroidism, unspecified: Secondary | ICD-10-CM | POA: Insufficient documentation

## 2015-09-11 DIAGNOSIS — F5104 Psychophysiologic insomnia: Secondary | ICD-10-CM | POA: Insufficient documentation

## 2015-09-11 DIAGNOSIS — E079 Disorder of thyroid, unspecified: Secondary | ICD-10-CM | POA: Insufficient documentation

## 2015-09-11 DIAGNOSIS — M1990 Unspecified osteoarthritis, unspecified site: Secondary | ICD-10-CM | POA: Insufficient documentation

## 2015-09-11 DIAGNOSIS — I2510 Atherosclerotic heart disease of native coronary artery without angina pectoris: Secondary | ICD-10-CM | POA: Insufficient documentation

## 2015-09-11 DIAGNOSIS — H7291 Unspecified perforation of tympanic membrane, right ear: Secondary | ICD-10-CM

## 2015-09-11 DIAGNOSIS — I252 Old myocardial infarction: Secondary | ICD-10-CM | POA: Insufficient documentation

## 2015-09-11 DIAGNOSIS — F17210 Nicotine dependence, cigarettes, uncomplicated: Secondary | ICD-10-CM | POA: Insufficient documentation

## 2015-09-11 DIAGNOSIS — E785 Hyperlipidemia, unspecified: Secondary | ICD-10-CM | POA: Insufficient documentation

## 2015-09-11 DIAGNOSIS — F17200 Nicotine dependence, unspecified, uncomplicated: Secondary | ICD-10-CM

## 2015-09-11 DIAGNOSIS — Z7982 Long term (current) use of aspirin: Secondary | ICD-10-CM | POA: Insufficient documentation

## 2015-09-11 DIAGNOSIS — F329 Major depressive disorder, single episode, unspecified: Secondary | ICD-10-CM | POA: Insufficient documentation

## 2015-09-11 DIAGNOSIS — I10 Essential (primary) hypertension: Secondary | ICD-10-CM

## 2015-09-11 DIAGNOSIS — H7290 Unspecified perforation of tympanic membrane, unspecified ear: Secondary | ICD-10-CM | POA: Insufficient documentation

## 2015-09-11 DIAGNOSIS — H9201 Otalgia, right ear: Secondary | ICD-10-CM | POA: Insufficient documentation

## 2015-09-11 DIAGNOSIS — Z1231 Encounter for screening mammogram for malignant neoplasm of breast: Secondary | ICD-10-CM | POA: Insufficient documentation

## 2015-09-11 DIAGNOSIS — J449 Chronic obstructive pulmonary disease, unspecified: Secondary | ICD-10-CM | POA: Insufficient documentation

## 2015-09-11 DIAGNOSIS — M549 Dorsalgia, unspecified: Secondary | ICD-10-CM | POA: Insufficient documentation

## 2015-09-11 DIAGNOSIS — F411 Generalized anxiety disorder: Secondary | ICD-10-CM | POA: Insufficient documentation

## 2015-09-11 DIAGNOSIS — K219 Gastro-esophageal reflux disease without esophagitis: Secondary | ICD-10-CM | POA: Insufficient documentation

## 2015-09-11 DIAGNOSIS — Z955 Presence of coronary angioplasty implant and graft: Secondary | ICD-10-CM | POA: Insufficient documentation

## 2015-09-11 DIAGNOSIS — J45909 Unspecified asthma, uncomplicated: Secondary | ICD-10-CM | POA: Insufficient documentation

## 2015-09-11 DIAGNOSIS — Z7189 Other specified counseling: Secondary | ICD-10-CM | POA: Insufficient documentation

## 2015-09-11 MED ORDER — TRAMADOL HCL 50 MG PO TABS: 50.0000 mg | ORAL_TABLET | Freq: Two times a day (BID) | ORAL | Status: DC | PRN

## 2015-09-11 NOTE — Patient Instructions (Signed)
Eardrum Perforation  An eardrum perforation is a puncture or tear in the eardrum. This is also called a ruptured eardrum. The eardrum is a thin, round tissue inside of your ear that separates your ear canal from your middle ear. This is the tissue that detects sound and enables you to hear. An eardrum perforation can cause discomfort and hearing loss. In most cases, the eardrum will heal without treatment and with little or no permanent hearing loss.  CAUSES  An eardrum perforation can result from different causes, including:  · Sudden pressure changes that happen in situations such as scuba diving or flying in an airplane.  · Foreign objects in the ear.  · Inserting a cotton-tipped swab or any blunt object into the ear.  · Loud noise.  · Trauma to the ear.  · Attempting to remove an object from the ear.  SIGNS AND SYMPTOMS  · Hearing loss.  · Ear pain.  · Ringing in the ear.  · Discharge or bleeding from the ear.  · Dizziness.  · Vomiting.  · Facial paralysis.  DIAGNOSIS   Your health care provider will examine your ear using a tool called an otoscope. This tool allows the health care provider to see into your ear to examine your eardrum. Various types of hearing tests may also be done.  TREATMENT   Typically, the eardrum will heal on its own within a few weeks. If your eardrum does not heal, your health care provider may recommend one of the following treatments:  · A procedure to place a patch over your eardrum.  · Surgery to repair your eardrum.  HOME CARE INSTRUCTIONS   · Keep your ear dry. This will improve healing. Do not submerge your head under water until healing is complete. Do not swim or dive until your health care provider approves. While bathing or showering, protect your ear using one of these methods:    Using a waterproof earplug.    Covering a piece of cotton with petroleum jelly and placing it in your outer ear canal.  · Take medicines only as directed by your health care provider.  · Avoid  blowing your nose if possible. If you blow your nose, do it gently. Forceful blowing increases the pressure in your middle ear. This may cause further injury or may delay your healing.  · Resume your normal activities after the perforation has healed. Your health care provider can tell you when this has occurred.  · Talk to your health care provider before you fly on an airplane. Air travel is generally allowed with a perforated eardrum.  · Keep all follow-up visits as directed by your health care provider. This is important.  SEEK MEDICAL CARE IF:  · You have a fever.  SEEK IMMEDIATE MEDICAL CARE IF:  · You have blood or pus coming from your ear.  · You have dizziness or problems with balance.  · You have nausea or vomiting.  · You have increased pain.     This information is not intended to replace advice given to you by your health care provider. Make sure you discuss any questions you have with your health care provider.     Document Released: 06/28/2000 Document Revised: 07/22/2014 Document Reviewed: 02/07/2014  Elsevier Interactive Patient Education ©2016 [employer]

## 2015-09-11 NOTE — Progress Notes (Signed)
Depression screen [facility] 2/9 09/11/2015 06/22/2015 05/18/2015  Decreased Interest 1 2 1   Down, Depressed, Hopeless 1 2 1   PHQ - 2 Score 2 4 2   Altered sleeping 0 2 0  Tired, decreased energy 1 2 1   Change in appetite 0 2 1  Feeling bad or failure about yourself  1 2 0  Trouble concentrating 0 2 0  Moving slowly or fidgety/restless 0 2 0  Suicidal thoughts 0 0 0  PHQ-9 Score 4 16 4      GAD 7 : Generalized Anxiety Score 09/11/2015 05/18/2015  Nervous, Anxious, on Edge 1 2  Control/stop worrying 1 1  Worry too much - different things 1 2  Trouble relaxing 0 2  Restless 0 1  Easily annoyed or irritable 0 2  Afraid - awful might happen 0 0  Total GAD 7 Score 3 10

## 2015-09-11 NOTE — Progress Notes (Signed)
[name] [name], is a 60 y.o. female  [id number]  [id number]  DOB - 1956-02-08  CC:  Chief Complaint  Patient presents with  . Ear Pain   HPI: [name] [name] is a 60 y.o. female here today to for problem visit. Patient has history of coronary artery disease status post multiple stenting, dyslipidemia, hypertension, chronic rhinitis and sinusitis, chronic back pain, COPD, GERD, diverticulosis, generalized anxiety disorder and chronic insomnia. Patient presents with complaint of right ear pain and decreased hearing. Patient perforated her ear drum with Q-tip on 08/20/15 and was seen by urgent care who prescribed ofloxacin 0.3% otic solution TID for 5 days. Patient states swelling pain and hearing is much improved since starting the ofloxacin. Patient denies recent discharge or bleeding from ear. Patient continues to smoke cigarettes 1 pack per day, and is considering quitting, has prescription for Chantix. Patient also seeks refill for Tramadol for chronic back pain. Patient has No headache, No chest pain, No abdominal pain - No Nausea, No new weakness tingling or numbness.  Allergies  Allergen Reactions  . Other Anaphylaxis    avacdos  . Codeine Nausea Only  . Latex     REACTION: shortness of breath and rash   Past Medical History  Diagnosis Date  . Thyroid disease   . Coronary artery disease   . Hernia   . Chronic back pain     herniated nucleus pulposus  . Hyperlipidemia     takes Lipitor daily  . Hypertension     takes Losartan daily and Labetalol bid  . Heart attack ([place]) 2011  . Asthma   . COPD (chronic obstructive pulmonary disease) ([place])     early stages  . Shortness of breath     with exertion  . Bronchitis     couple of months ago  . Arthritis   . Joint pain   . Neck pain   . Bruises easily     pt is on Effient  . Psoriasis     elbows,knees,back  . H/O hiatal hernia   . GERD (gastroesophageal reflux disease)     takes Nexium daily  . Hemorrhoids   .  Diverticulosis   . Slowing of urinary stream   . Hypothyroidism     takes Synthroid daily  . Depression     takes Klonopin daily  . Anxiety     takes Lexapro daily  . Insomnia     hydroxyzine prn  . Bartholin gland cyst 08/29/2011   Current Outpatient Prescriptions on File Prior to Visit  Medication Sig Dispense Refill  . albuterol (VENTOLIN HFA) 108 (90 BASE) MCG/ACT inhaler Inhale 1 puff into the lungs every 6 (six) hours as needed for wheezing or shortness of breath. 8 g 3  . aspirin 325 MG tablet Take 1 tablet (325 mg total) by mouth daily.    Marland Kitchen atorvastatin (LIPITOR) 40 MG tablet TAKE 1 TABLET BY MOUTH ONCE DAILY 90 tablet 0  . Calcium 600 MG tablet  60 tablet   . carbamide peroxide (DEBROX) 6.5 % otic solution Place 5 drops into both ears 2 (two) times daily. For 5 days 15 mL 0  . clobetasol cream (TEMOVATE) [id number] % Apply 1 application topically 2 (two) times daily. 30 g 0  . clonazePAM (KLONOPIN) 0.5 MG tablet Take 1 tablet (0.5 mg total) by mouth at bedtime as needed for anxiety. 30 tablet 1  . cyclobenzaprine (FLEXERIL) 10 MG tablet Take 1 tablet (10 mg total) by mouth 3 (three)  times daily as needed for muscle spasms. 90 tablet 3  . ergocalciferol (VITAMIN D2) 50000 UNITS capsule Take 1 capsule (50,000 Units total) by mouth once a week. 8 capsule 0  . escitalopram (LEXAPRO) 10 MG tablet Take 1 tablet (10 mg total) by mouth daily. 30 tablet 3  . fluticasone (FLONASE) 50 MCG/ACT nasal spray Place 2 sprays into both nostrils at bedtime. 16 g 3  . Fluticasone-Salmeterol (ADVAIR DISKUS) 100-50 MCG/DOSE AEPB Inhale 1 puff into the lungs 2 (two) times daily. 180 each 3  . furosemide (LASIX) 20 MG tablet TAKE 1 TABLET BY MOUTH ONCE DAILY 90 tablet 0  . gabapentin (NEURONTIN) 600 MG tablet Take 1 tablet (600 mg total) by mouth 4 (four) times daily. 120 tablet 0  . HYDROcodone-acetaminophen (NORCO/VICODIN) 5-325 MG tablet Take 2 tablets by mouth every 4 (four) hours as needed for moderate  pain. 20 tablet 0  . ipratropium (ATROVENT) 0.02 % nebulizer solution Take 2.5 mLs (0.5 mg total) by nebulization every 4 (four) hours as needed for wheezing or shortness of breath. 75 mL 12  . labetalol (NORMODYNE) 100 MG tablet TAKE 1 TABLET BY MOUTH 2 TIMES DAILY. 180 tablet 0  . levothyroxine (SYNTHROID, LEVOTHROID) 100 MCG tablet TAKE 1 TABLET BY MOUTH DAILY 90 tablet 0  . loratadine (CLARITIN) 10 MG tablet Take 1 tablet (10 mg total) by mouth daily. 30 tablet 3  . losartan (COZAAR) 50 MG tablet TAKE 1 TABLET BY MOUTH ONCE DAILY 90 tablet 0  . montelukast (SINGULAIR) 10 MG tablet TAKE 1 TABLET BY MOUTH AT BEDTIME. 90 tablet 3  . ofloxacin (FLOXIN) 0.3 % otic solution Place 5 drops into the right ear 2 (two) times daily. X 7 days 10 mL 0  . omeprazole (PRILOSEC) 40 MG capsule Take 1 capsule (40 mg total) by mouth daily. 90 capsule 3  . prasugrel (EFFIENT) 10 MG TABS tablet Take 1 tablet (10 mg total) by mouth daily. 30 tablet   . varenicline (CHANTIX STARTING MONTH PAK) 0.5 MG X 11 & 1 MG X 42 tablet Take one 0.5 mg tablet by mouth once daily for 3 days, then increase to one 0.5 mg tablet twice daily for 4 days, then increase to one 1 mg tablet twice daily. 53 tablet 0   No current facility-administered medications on file prior to visit.   Family History  Problem Relation Age of Onset  . Hypotension Neg Hx   . Malignant hyperthermia Neg Hx   . Pseudochol deficiency Neg Hx   . Anesthesia problems Daughter   . Heart disease Mother   . Hypertension Mother   . Stroke Mother   . Heart disease Father   . Hypertension Father   . Diabetes Father   . Cancer Maternal Aunt     breast  . Thyroid disease Paternal Aunt   . Heart disease Maternal Grandfather   . Cancer Paternal Grandmother     mouth   Social History   Social History  . Marital Status: Single    Spouse Name: N/A  . Number of Children: N/A  . Years of Education: N/A   Occupational History  . Not on file.   Social  History Main Topics  . Smoking status: Current Every Day Smoker -- 1.00 packs/day for 44 years    Types: Cigarettes  . Smokeless tobacco: Not on file  . Alcohol Use: No     Comment: alcohol last night  . Drug Use: No     Comment:  crack cocaine last night  . Sexual Activity: Not Currently    Birth Control/ Protection: Surgical   Other Topics Concern  . Not on file   Social History Narrative    Review of Systems: Constitutional: Negative for fever, chills, diaphoresis, activity change, appetite change and fatigue. HENT: Negative for ear pain, nosebleeds, congestion, facial swelling, rhinorrhea, neck pain, neck stiffness and ear discharge.  Eyes: Negative for pain, discharge, redness, itching and visual disturbance. Respiratory: Negative for cough, choking, chest tightness, shortness of breath, wheezing and stridor.  Cardiovascular: Negative for chest pain, palpitations and leg swelling. Gastrointestinal: Negative for abdominal distention. Genitourinary: Negative for dysuria, urgency, frequency, hematuria, flank pain, decreased urine volume, difficulty urinating and dyspareunia.  Musculoskeletal: Negative for back pain, joint swelling, arthralgia and gait problem. Neurological: Negative for dizziness, tremors, seizures, syncope, facial asymmetry, speech difficulty, weakness, light-headedness, numbness and headaches.  Hematological: Negative for adenopathy. Does not bruise/bleed easily. Psychiatric/Behavioral: Negative for hallucinations, behavioral problems, confusion, dysphoric mood, decreased concentration and agitation.    Objective:   Filed Vitals:   09/11/15 1004  BP: 140/89  Pulse: 73  Temp: 98.6 F (37 C)  Resp: 16    Physical Exam: Constitutional: Patient appears well-developed and well-nourished. No distress. HENT: Normocephalic, atraumatic, External right and left ear normal. Right ear drum ruptured / decreased hearing in right ear CVS: RRR, S1/S2 +, no murmurs, no  gallops, no carotid bruit.  Pulmonary: Effort and breath sounds normal, no stridor, rhonchi, wheezes, rales.  Abdominal: Soft. BS +, no distension, tenderness, rebound or guarding.  Musculoskeletal: Normal range of motion. No edema and no tenderness.  Lymphadenopathy: No lymphadenopathy noted, cervical, inguinal or axillary Neuro: Alert & Oriented x 4 Skin: Skin is warm and dry. No rash noted. Not diaphoretic. No erythema. No pallor. Psychiatric: Normal mood and affect. Behavior, judgment, thought content normal.  Lab Results  Component Value Date   WBC 7.4 06/22/2015   HGB 14.6 06/22/2015   HCT 42.8 06/22/2015   MCV 92.6 06/22/2015   PLT 438* 06/22/2015   Lab Results  Component Value Date   CREATININE 0.88 06/22/2015   BUN 7 06/22/2015   NA 140 06/22/2015   K 4.2 06/22/2015   CL 102 06/22/2015   CO2 32* 06/22/2015    Lab Results  Component Value Date   HGBA1C 6.0 06/22/2015   Lipid Panel     Component Value Date/Time   CHOL 156 06/22/2015 1049   TRIG 134 06/22/2015 1049   HDL 58 06/22/2015 1049   CHOLHDL 2.7 06/22/2015 1049   VLDL 27 06/22/2015 1049   LDLCALC 71 06/22/2015 1049       Assessment and plan:   [name] [name] was seen today for ear pain.  Diagnoses and all orders for this visit:  Essential hypertension, benign  We have discussed target BP range and blood pressure goal. I have advised patient to check BP regularly and to call us back or report to clinic if the numbers are consistently higher than 140/90. We discussed the importance of compliance with medical therapy and DASH diet recommended, consequences of uncontrolled hypertension discussed.   - continue current BP medications  TOBACCO ABUSE  [name] [name] was counseled on the dangers of tobacco use, and was advised to quit. Reviewed strategies to maximize success, including removing cigarettes and smoking materials from environment, stress management and support of family/friends.  Perforated eardrum,  right  -     Ambulatory referral to ENT  Patient was instructed to not get ear wet,  no swimming no diving and no Qtips in the ear. Patient verbalized understanding.  Chronic back pain  -     traMADol (ULTRAM) 50 MG tablet; Take 1 tablet (50 mg total) by mouth every 12 (twelve) hours as needed (cough).  Encounter for screening mammogram for breast cancer  -     MM Digital Screening; Future  Return in about 3 months (around 12/09/2015) for Follow up HTN, Generalized Anxiety Disorder.  The patient was given clear instructions to go to ER or return to medical center if symptoms don't improve, worsen or new problems develop. The patient verbalized understanding.   Clois Dupes, [facility] [phone] 09/11/2015, 2:42 PM   Evaluation and management procedures were performed by the Advanced Practitioner under my supervision and collaboration. I have reviewed the Advanced Practitioner's note and chart, and I agree with the management and plan.   Angelica Chessman, MD, [place], [place], [place], [place] and [place], [place]   09/11/2015, 6:10 PM

## 2015-09-11 NOTE — Progress Notes (Signed)
Pt writes (on PHQ9/GAD7) on 09-11-15, "I'm going through a process of letting my mother die comfortably. 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 My mind wonders what could I have done different."

## 2015-09-11 NOTE — Progress Notes (Signed)
Patient here for right ear pain. Patient reports she accidentally jammed a q-tip in it about 2 weeks ago. Patient has been to another clinic about it and they gave her medication (ofloxacin) and pain medication for it. Patient would like a second opinion. Patient needs a refill on tramadol. Patient has taken her medications today.

## 2015-09-18 ENCOUNTER — Ambulatory Visit: Payer: No Typology Code available for payment source | Attending: Internal Medicine

## 2015-09-22 ENCOUNTER — Other Ambulatory Visit: Payer: Self-pay | Admitting: Internal Medicine

## 2015-09-22 MED FILL — !ADVAIR 100/50 DISKUS: 100-50 | 14 days supply | Qty: 28 | Fill #1

## 2015-09-22 MED FILL — ?LEVOTHYROXINE 100 MCG TAB: 100 | 30 days supply | Qty: 30 | Fill #1

## 2015-09-22 MED FILL — ?ATORVASTATIN 40MG TABLET: 40 | 30 days supply | Qty: 30 | Fill #1

## 2015-09-22 MED FILL — ?MONTELUKAST SOD 10 MG TAB: 10 | 30 days supply | Qty: 30 | Fill #4

## 2015-09-22 MED FILL — OMEPRAZOLE DR 20 MG CAPSULE: 20 | 30 days supply | Qty: 60 | Fill #0

## 2015-09-22 MED FILL — ?FUROSEMIDE 20 MG TABLET: 20 | 30 days supply | Qty: 30 | Fill #1

## 2015-09-25 ENCOUNTER — Telehealth: Payer: Self-pay

## 2015-09-25 NOTE — Telephone Encounter (Signed)
Please call pt regarding her effient sample

## 2015-10-02 ENCOUNTER — Ambulatory Visit: Payer: Self-pay

## 2015-10-09 ENCOUNTER — Emergency Department (HOSPITAL_COMMUNITY)
Admission: EM | Admit: 2015-10-09 | Discharge: 2015-10-09 | Disposition: A | Discharge status: Home / Self Care | Payer: No Typology Code available for payment source | Source: Home / Self Care | Attending: Family Medicine | Admitting: Family Medicine

## 2015-10-09 ENCOUNTER — Encounter (HOSPITAL_COMMUNITY): Payer: Self-pay | Admitting: Emergency Medicine

## 2015-10-09 ENCOUNTER — Other Ambulatory Visit: Payer: Self-pay | Admitting: Internal Medicine

## 2015-10-09 DIAGNOSIS — J069 Acute upper respiratory infection, unspecified: Secondary | ICD-10-CM

## 2015-10-09 DIAGNOSIS — R0982 Postnasal drip: Secondary | ICD-10-CM

## 2015-10-09 DIAGNOSIS — J441 Chronic obstructive pulmonary disease with (acute) exacerbation: Secondary | ICD-10-CM

## 2015-10-09 MED ORDER — ALBUTEROL SULFATE (2.5 MG/3ML) 0.083% IN NEBU
2.5000 mg | INHALATION_SOLUTION | Freq: Once | RESPIRATORY_TRACT | Status: AC
  Administered 2015-10-09: 2.5 mg via RESPIRATORY_TRACT

## 2015-10-09 MED ORDER — PREDNISONE 20 MG PO TABS: ORAL_TABLET | ORAL | Status: DC

## 2015-10-09 MED ORDER — AZITHROMYCIN 250 MG PO TABS: ORAL_TABLET | ORAL | Status: DC

## 2015-10-09 MED ORDER — IPRATROPIUM-ALBUTEROL 0.5-2.5 (3) MG/3ML IN SOLN
3.0000 mL | Freq: Once | RESPIRATORY_TRACT | Status: AC
  Administered 2015-10-09: 3 mL via RESPIRATORY_TRACT

## 2015-10-09 MED ORDER — ALBUTEROL SULFATE (2.5 MG/3ML) 0.083% IN NEBU
INHALATION_SOLUTION | RESPIRATORY_TRACT | Status: AC
  Filled 2015-10-09: qty 3

## 2015-10-09 MED ORDER — IPRATROPIUM-ALBUTEROL 0.5-2.5 (3) MG/3ML IN SOLN
RESPIRATORY_TRACT | Status: AC
  Filled 2015-10-09: qty 3

## 2015-10-09 MED ORDER — ALBUTEROL SULFATE HFA 108 (90 BASE) MCG/ACT IN AERS: 2.0000 | INHALATION_SPRAY | Freq: Once | RESPIRATORY_TRACT | Status: DC

## 2015-10-09 MED ORDER — ALBUTEROL SULFATE HFA 108 (90 BASE) MCG/ACT IN AERS
INHALATION_SPRAY | RESPIRATORY_TRACT | Status: AC
  Filled 2015-10-09: qty 6.7

## 2015-10-09 MED FILL — CYCLOBENZAPRINE 10 MG TAB: 10 | 30 days supply | Qty: 90 | Fill #1

## 2015-10-09 MED FILL — VENTOLIN HFA 90 MCG INHALER: 108 (90 BAS | 25 days supply | Qty: 18 | Fill #2

## 2015-10-09 NOTE — ED Notes (Signed)
Pt is here for a productive cough x1 week with nasal congestion, sore throat, and pain in her lower ribs from coughing.  She denies a fever.  She has been using her nebulizer at home, but is out of her Ventolin and has also tried Mucinex.

## 2015-10-09 NOTE — ED Provider Notes (Signed)
CSN: [id number]     Arrival date & time 10/09/15  1258 History   First MD Initiated Contact with Patient 10/09/15 1309     Chief Complaint  Patient presents with  . Cough   (Consider location/radiation/quality/duration/timing/severity/associated sxs/prior Treatment) HPI Comments: 60 year old female with COPD and smokes half to one pack per day for several years is complaining of wheezing and dyspnea that is not completely relieved with her nebulizer and handheld albuterol. She has been seen here a few weeks ago for similar symptoms. She is also complaining of sinus congestion, scope this amount of PND minor sore throat. She is uncertain as to whether she may have had a fever or not she is afebrile today. There is a strong odor of cigarettes within her clothes.   Past Medical History  Diagnosis Date  . Thyroid disease   . Coronary artery disease   . Hernia   . Chronic back pain     herniated nucleus pulposus  . Hyperlipidemia     takes Lipitor daily  . Hypertension     takes Losartan daily and Labetalol bid  . Heart attack ([place]) 2011  . Asthma   . COPD (chronic obstructive pulmonary disease) ([place])     early stages  . Shortness of breath     with exertion  . Bronchitis     couple of months ago  . Arthritis   . Joint pain   . Neck pain   . Bruises easily     pt is on Effient  . Psoriasis     elbows,knees,back  . H/O hiatal hernia   . GERD (gastroesophageal reflux disease)     takes Nexium daily  . Hemorrhoids   . Diverticulosis   . Slowing of urinary stream   . Hypothyroidism     takes Synthroid daily  . Depression     takes Klonopin daily  . Anxiety     takes Lexapro daily  . Insomnia     hydroxyzine prn  . Bartholin gland cyst 08/29/2011   Past Surgical History  Procedure Laterality Date  . Abdominal hysterectomy  1977  . Laparoscopy  1977    exploratory  . Tonsillectomy      as a child  . Coronary angioplasty with stent placement  2011    x 2  .  Colonoscopy    . Esophagogastroduodenscoy    . Lumbar laminectomy/decompression microdiscectomy  08/05/2011    Procedure: LUMBAR LAMINECTOMY/DECOMPRESSION MICRODISCECTOMY;  Surgeon: Otilio Connors, MD;  Location: [facility];  Service: Neurosurgery;  Laterality: Right;  Right Lumbar four-five extraforaminal discectomy   Family History  Problem Relation Age of Onset  . Hypotension Neg Hx   . Malignant hyperthermia Neg Hx   . Pseudochol deficiency Neg Hx   . Anesthesia problems Daughter   . Heart disease Mother   . Hypertension Mother   . Stroke Mother   . Heart disease Father   . Hypertension Father   . Diabetes Father   . Cancer Maternal Aunt     breast  . Thyroid disease Paternal Aunt   . Heart disease Maternal Grandfather   . Cancer Paternal Grandmother     mouth   Social History  Substance Use Topics  . Smoking status: Current Every Day Smoker -- 1.00 packs/day for 44 years    Types: Cigarettes  . Smokeless tobacco: None  . Alcohol Use: No     Comment: alcohol last night   OB History  Gravida Para Term Preterm AB TAB SAB Ectopic Multiple Living   1 1 1  0 0 0 0 0 0 1     Review of Systems  Constitutional: Positive for activity change. Negative for fever, chills, appetite change and fatigue.  HENT: Positive for congestion, postnasal drip, rhinorrhea and sore throat. Negative for ear discharge and facial swelling.   Eyes: Negative.   Respiratory: Positive for cough, shortness of breath and wheezing.   Cardiovascular: Negative.   Gastrointestinal: Negative.   Musculoskeletal: Negative.  Negative for neck pain and neck stiffness.  Skin: Negative.  Negative for pallor and rash.  Neurological: Negative.   All other systems reviewed and are negative.   Allergies  Other; Codeine; and Latex  Home Medications   Prior to Admission medications   Medication Sig Start Date End Date Taking? Authorizing Provider  ipratropium (ATROVENT) 0.02 % nebulizer solution Take 2.5  mLs (0.5 mg total) by nebulization every 4 (four) hours as needed for wheezing or shortness of breath. 08/19/13  Yes Gregor Hams, MD  albuterol (VENTOLIN HFA) 108 (90 BASE) MCG/ACT inhaler Inhale 1 puff into the lungs every 6 (six) hours as needed for wheezing or shortness of breath. 05/18/15   Boykin Nearing, MD  aspirin 325 MG tablet Take 1 tablet (325 mg total) by mouth daily. 07/14/13   Patrecia Pour, NP  atorvastatin (LIPITOR) 40 MG tablet TAKE 1 TABLET BY MOUTH ONCE DAILY 08/15/15   Tresa Garter, MD  azithromycin (ZITHROMAX) 250 MG tablet 2 tabs po on day one, then one tablet po once daily on days 2-5. 10/09/15   Janne Napoleon, NP  Calcium 600 MG tablet  07/14/13   Patrecia Pour, NP  carbamide peroxide (DEBROX) 6.5 % otic solution Place 5 drops into both ears 2 (two) times daily. For 5 days 05/18/15   Boykin Nearing, MD  clobetasol cream (TEMOVATE) [id number] % Apply 1 application topically 2 (two) times daily. 06/22/15   Tresa Garter, MD  clonazePAM (KLONOPIN) 0.5 MG tablet Take 1 tablet (0.5 mg total) by mouth at bedtime as needed for anxiety. 06/22/15   Tresa Garter, MD  cyclobenzaprine (FLEXERIL) 10 MG tablet Take 1 tablet (10 mg total) by mouth 3 (three) times daily as needed for muscle spasms. 06/22/15   Tresa Garter, MD  ergocalciferol (VITAMIN D2) 50000 UNITS capsule Take 1 capsule (50,000 Units total) by mouth once a week. 08/24/14   Tresa Garter, MD  escitalopram (LEXAPRO) 10 MG tablet Take 1 tablet (10 mg total) by mouth daily. 05/18/15   Josalyn Funches, MD  fluticasone (FLONASE) 50 MCG/ACT nasal spray Place 2 sprays into both nostrils at bedtime. 05/18/15   Josalyn Funches, MD  Fluticasone-Salmeterol (ADVAIR DISKUS) 100-50 MCG/DOSE AEPB Inhale 1 puff into the lungs 2 (two) times daily. 07/05/15   Tresa Garter, MD  furosemide (LASIX) 20 MG tablet TAKE 1 TABLET BY MOUTH ONCE DAILY 08/15/15   Tresa Garter, MD  gabapentin (NEURONTIN) 600 MG tablet Take 1  tablet (600 mg total) by mouth 4 (four) times daily. 07/14/13   Patrecia Pour, NP  HYDROcodone-acetaminophen (NORCO/VICODIN) 5-325 MG tablet Take 2 tablets by mouth every 4 (four) hours as needed for moderate pain. 09/05/15   Melynda Ripple, MD  labetalol (NORMODYNE) 100 MG tablet TAKE 1 TABLET BY MOUTH 2 TIMES DAILY. 08/21/15   Tresa Garter, MD  levothyroxine (SYNTHROID, LEVOTHROID) 100 MCG tablet TAKE 1 TABLET BY MOUTH DAILY 08/21/15   Olugbemiga  Essie Christine, MD  loratadine (CLARITIN) 10 MG tablet Take 1 tablet (10 mg total) by mouth daily. 06/22/15   Tresa Garter, MD  losartan (COZAAR) 50 MG tablet TAKE 1 TABLET BY MOUTH ONCE DAILY 08/15/15   Tresa Garter, MD  montelukast (SINGULAIR) 10 MG tablet TAKE 1 TABLET BY MOUTH AT BEDTIME. 05/18/15   Josalyn Funches, MD  ofloxacin (FLOXIN) 0.3 % otic solution Place 5 drops into the right ear 2 (two) times daily. X 7 days 09/05/15   Melynda Ripple, MD  omeprazole (PRILOSEC) 20 MG capsule TAKE 2 CAPSULES BY MOUTH DAILY 09/22/15   Tresa Garter, MD  prasugrel (EFFIENT) 10 MG TABS tablet Take 1 tablet (10 mg total) by mouth daily. 07/14/13   Patrecia Pour, NP  predniSONE (DELTASONE) 20 MG tablet 3 Tabs PO Days 1-3, then 2 tabs PO Days 4-6, then 1 tab PO Day 7-9, then Half Tab PO Day 10-12 10/09/15   Janne Napoleon, NP  traMADol (ULTRAM) 50 MG tablet Take 1 tablet (50 mg total) by mouth every 12 (twelve) hours as needed (cough). 09/11/15   Tresa Garter, MD  varenicline (CHANTIX STARTING MONTH PAK) 0.5 MG X 11 & 1 MG X 42 tablet Take one 0.5 mg tablet by mouth once daily for 3 days, then increase to one 0.5 mg tablet twice daily for 4 days, then increase to one 1 mg tablet twice daily. 05/18/15   Boykin Nearing, MD   Meds Ordered and Administered this Visit   Medications  ipratropium-albuterol (DUONEB) 0.5-2.5 (3) MG/3ML nebulizer solution 3 mL (3 mLs Nebulization Given 10/09/15 1338)  albuterol (PROVENTIL) (2.5 MG/3ML) 0.083% nebulizer  solution 2.5 mg (2.5 mg Nebulization Given 10/09/15 1337)    BP 156/92 mmHg  Pulse 96  Temp(Src) 98.1 F (36.7 C) (Oral)  Resp 16  SpO2 97% No data found.   Physical Exam  Constitutional: She is oriented to person, place, and time. She appears well-developed and well-nourished. No distress.  HENT:  Mouth/Throat: No oropharyngeal exudate.  Left TM appears well. Right TM with some distortion but no erythema, draining or bleeding. She has a recent history of ruptured TM.  Oropharynx with erythema primarily to the posterior pharynx with streaking and cobblestoning. No exudates or swelling. Tongue with tobacco stains.  Eyes: Conjunctivae and EOM are normal.  Neck: Normal range of motion. Neck supple.  Cardiovascular: Normal rate, regular rhythm and normal heart sounds.   Pulmonary/Chest: Effort normal. No respiratory distress. She has wheezes.  Lungs with inspiratory and expiratory wheezes. Prolonged expiratory phase.  Musculoskeletal: Normal range of motion. She exhibits no edema.  Lymphadenopathy:    She has no cervical adenopathy.  Neurological: She is alert and oriented to person, place, and time.  Skin: Skin is warm and dry. No rash noted.  Psychiatric: She has a normal mood and affect.  Nursing note and vitals reviewed.   ED Course  Procedures (including critical care time)  Labs Review Labs Reviewed - No data to display  Imaging Review No results found.   Visual Acuity Review  Right Eye Distance:   Left Eye Distance:   Bilateral Distance:    Right Eye Near:   Left Eye Near:    Bilateral Near:         MDM   1. COPD with exacerbation ([place])   2. URI (upper respiratory infection)   3. PND (post-nasal drip)    Meds ordered this encounter  Medications  . ipratropium-albuterol (DUONEB) 0.5-2.5 (3)  MG/3ML nebulizer solution 3 mL    Sig:   . DISCONTD: albuterol (PROVENTIL HFA;VENTOLIN HFA) 108 (90 Base) MCG/ACT inhaler 2 puff    Sig:   . albuterol  (PROVENTIL) (2.5 MG/3ML) 0.083% nebulizer solution 2.5 mg    Sig:   . predniSONE (DELTASONE) 20 MG tablet    Sig: 3 Tabs PO Days 1-3, then 2 tabs PO Days 4-6, then 1 tab PO Day 7-9, then Half Tab PO Day 10-12    Dispense:  20 tablet    Refill:  0    Order Specific Question:  Supervising Provider    Answer:  Billy Fischer [phone]  . azithromycin (ZITHROMAX) 250 MG tablet    Sig: 2 tabs po on day one, then one tablet po once daily on days 2-5.    Dispense:  6 tablet    Refill:  0    Order Specific Question:  Supervising Provider    Answer:  Billy Fischer [phone]   Post DuoNeb there is noticeable decrease in wheezing and an increase in air movement. Continue using her nebulizer at home. Start medications as above. Silverio Lay PCP today for follow-up appointment this week. Stop smoking. Read instructions for OTC medications to help with upper respiratory congestion.    Janne Napoleon, NP 10/09/15 1401

## 2015-10-09 NOTE — Discharge Instructions (Signed)
Chronic Obstructive Pulmonary Disease Exacerbation Chronic obstructive pulmonary disease (COPD) is a common lung problem. In COPD, the flow of air from the lungs is limited. COPD exacerbations are times that breathing gets worse and you need extra treatment. Without treatment they can be life threatening. If they happen often, your lungs can become more damaged. If your COPD gets worse, your doctor may treat you with:  Medicines.  Oxygen.  Different ways to clear your airway, such as using a mask. HOME CARE  Do not smoke.  Avoid tobacco smoke and other things that bother your lungs.  If given, take your antibiotic medicine as told. Finish the medicine even if you start to feel better.  Only take medicines as told by your doctor.  Drink enough fluids to keep your pee (urine) clear or pale yellow (unless your doctor has told you not to).  Use a cool mist machine (vaporizer).  If you use oxygen or a machine that turns liquid medicine into a mist (nebulizer), continue to use them as told.  Keep up with shots (vaccinations) as told by your doctor.  Exercise regularly.  Eat healthy foods.  Keep all doctor visits as told. GET HELP RIGHT AWAY IF:  You are very short of breath and it gets worse.  You have trouble talking.  You have bad chest pain.  You have blood in your spit (sputum).  You have a fever.  You keep throwing up (vomiting).  You feel weak, or you pass out (faint).  You feel confused.  You keep getting worse. MAKE SURE YOU:  Understand these instructions.  Will watch your condition.  Will get help right away if you are not doing well or get worse.   This information is not intended to replace advice given to you by your health care provider. Make sure you discuss any questions you have with your health care provider.   Document Released: 06/20/2011 Document Revised: 07/22/2014 Document Reviewed: 03/05/2013 Elsevier Interactive Patient Education 2016  Elsevier Inc.  Viral Infections For nasal and head congestion may take Sudafed PE 10 mg every 4 hours as needed. Saline nasal spray used frequently. For drainage may use Allegra, Claritin or Zyrtec. If you need stronger medicine to stop drainage may take Chlor-Trimeton 2-4 mg every 4 hours. This may cause drowsiness. Ibuprofen 400 mg every 6 hours as needed for pain, discomfort or fever. Drink plenty of fluids and stay well-hydrated.  A virus is a type of germ. Viruses can cause:  Minor sore throats.  Aches and pains.  Headaches.  Runny nose.  Rashes.  Watery eyes.  Tiredness.  Coughs.  Loss of appetite.  Feeling sick to your stomach (nausea).  Throwing up (vomiting).  Watery poop (diarrhea). HOME CARE   Only take medicines as told by your doctor.  Drink enough water and fluids to keep your pee (urine) clear or pale yellow. Sports drinks are a good choice.  Get plenty of rest and eat healthy. Soups and broths with crackers or rice are fine. GET HELP RIGHT AWAY IF:   You have a very bad headache.  You have shortness of breath.  You have chest pain or neck pain.  You have an unusual rash.  You cannot stop throwing up.  You have watery poop that does not stop.  You cannot keep fluids down.  You or your child has a temperature by mouth above 102 F (38.9 C), not controlled by medicine.  Your baby is older than 3 months with  a rectal temperature of 102 F (38.9 C) or higher.  Your baby is 75 months old or younger with a rectal temperature of 100.4 F (38 C) or higher. MAKE SURE YOU:   Understand these instructions.  Will watch this condition.  Will get help right away if you are not doing well or get worse.   This information is not intended to replace advice given to you by your health care provider. Make sure you discuss any questions you have with your health care provider.   Document Released: 06/13/2008 Document Revised: 09/23/2011 Document  Reviewed: 12/07/2014 Elsevier Interactive Patient Education [employer].

## 2015-10-12 NOTE — Telephone Encounter (Signed)
Pt. Came into facility requesting a med refill on the following medications:  clonazePAM (KLONOPIN) 0.5 MG tablet  traMADol (ULTRAM) 50 MG tablet   Please f/u with pt.

## 2015-10-16 ENCOUNTER — Ambulatory Visit: Payer: Self-pay

## 2015-10-16 ENCOUNTER — Telehealth: Payer: Self-pay | Admitting: Internal Medicine

## 2015-10-16 NOTE — Telephone Encounter (Signed)
Patient came into clinic requesting medication refill on clonazePAM (KLONOPIN) 0.5 MG tablet and Tramadol, please f/u

## 2015-10-17 ENCOUNTER — Telehealth: Payer: Self-pay | Admitting: Cardiovascular Disease

## 2015-10-17 ENCOUNTER — Other Ambulatory Visit: Payer: Self-pay | Admitting: *Deleted

## 2015-10-17 ENCOUNTER — Telehealth: Payer: Self-pay | Admitting: Internal Medicine

## 2015-10-17 DIAGNOSIS — G8929 Other chronic pain: Secondary | ICD-10-CM

## 2015-10-17 DIAGNOSIS — M549 Dorsalgia, unspecified: Principal | ICD-10-CM

## 2015-10-17 MED ORDER — TRAMADOL HCL 50 MG PO TABS: 50.0000 mg | ORAL_TABLET | Freq: Two times a day (BID) | ORAL | Status: DC | PRN

## 2015-10-17 NOTE — Telephone Encounter (Signed)
Patients Klonopin was refilled on 10/16/15. A request for Tramadol was routed to PCP for approval

## 2015-10-17 NOTE — Telephone Encounter (Signed)
Patient called, states she called last month about refilling her rx for Effient (from [employer]).  I never received this message at that time. Re-ordered today, however patient now out of med.  Left samples x 4 weeks at front desk until 4 month supply can arrive

## 2015-10-17 NOTE — Telephone Encounter (Signed)
Patient last OV with PCP on 09/11/15. Patient last filled Tramadol on 09/11/15. Patient recieved Klonopin refill on 10/16/15

## 2015-10-17 NOTE — Telephone Encounter (Signed)
Patient had an appointment scheduled on 4/19 at [facility], however, when [facility] called to confirm appointment with patient, patient stated that the issue had resolved itself and she is not longer in need of ENT referral. Patient agreed to cancel appointment with [facility]

## 2015-10-18 NOTE — Telephone Encounter (Signed)
Patients medications have been placed at the front desk for pick-up.

## 2015-10-23 MED FILL — ?ESCITALOPRAM 10 MG TABLET: 10 | 30 days supply | Qty: 30 | Fill #2

## 2015-10-23 MED FILL — ATORVASTATIN 40 MG TABLET: 40 | 30 days supply | Qty: 30 | Fill #2

## 2015-10-23 MED FILL — LABETALOL HCL 100 MG TABLET: 100 | 30 days supply | Qty: 60 | Fill #1

## 2015-10-23 MED FILL — LOSARTAN POTASSIUM 50 MG TA: 50 | 90 days supply | Qty: 90 | Fill #1

## 2015-10-23 MED FILL — ?FUROSEMIDE 20 MG TABLET: 20 | 30 days supply | Qty: 30 | Fill #2

## 2015-10-23 MED FILL — ?LEVOTHYROXINE 100 MCG TAB: 100 | 30 days supply | Qty: 30 | Fill #2

## 2015-10-23 MED FILL — ?MONTELUKAST SOD 10 MG TAB: 10 | 30 days supply | Qty: 30 | Fill #5

## 2015-10-23 MED FILL — !ADVAIR 100/50 DISKUS: 100-50 | 30 days supply | Qty: 60 | Fill #2

## 2015-10-26 ENCOUNTER — Encounter (HOSPITAL_COMMUNITY): Payer: Self-pay | Admitting: Emergency Medicine

## 2015-10-26 ENCOUNTER — Ambulatory Visit (HOSPITAL_COMMUNITY)
Admission: EM | Admit: 2015-10-26 | Discharge: 2015-10-26 | Disposition: A | Discharge status: Home / Self Care | Payer: No Typology Code available for payment source | Attending: Family Medicine | Admitting: Family Medicine

## 2015-10-26 ENCOUNTER — Encounter (HOSPITAL_COMMUNITY): Payer: Self-pay

## 2015-10-26 ENCOUNTER — Ambulatory Visit (INDEPENDENT_AMBULATORY_CARE_PROVIDER_SITE_OTHER): Payer: No Typology Code available for payment source

## 2015-10-26 ENCOUNTER — Inpatient Hospital Stay (HOSPITAL_COMMUNITY)
Admission: EM | Admit: 2015-10-26 | Discharge: 2015-10-30 | DRG: 192 | Disposition: A | Discharge status: Home / Self Care | Payer: No Typology Code available for payment source | Attending: Internal Medicine | Admitting: Internal Medicine

## 2015-10-26 DIAGNOSIS — R0600 Dyspnea, unspecified: Secondary | ICD-10-CM

## 2015-10-26 DIAGNOSIS — R7981 Abnormal blood-gas level: Secondary | ICD-10-CM

## 2015-10-26 DIAGNOSIS — J4541 Moderate persistent asthma with (acute) exacerbation: Secondary | ICD-10-CM

## 2015-10-26 DIAGNOSIS — J438 Other emphysema: Secondary | ICD-10-CM

## 2015-10-26 DIAGNOSIS — G8929 Other chronic pain: Secondary | ICD-10-CM | POA: Diagnosis present

## 2015-10-26 DIAGNOSIS — J441 Chronic obstructive pulmonary disease with (acute) exacerbation: Principal | ICD-10-CM | POA: Diagnosis present

## 2015-10-26 DIAGNOSIS — J439 Emphysema, unspecified: Secondary | ICD-10-CM | POA: Diagnosis present

## 2015-10-26 DIAGNOSIS — L409 Psoriasis, unspecified: Secondary | ICD-10-CM | POA: Diagnosis present

## 2015-10-26 DIAGNOSIS — K219 Gastro-esophageal reflux disease without esophagitis: Secondary | ICD-10-CM | POA: Diagnosis present

## 2015-10-26 DIAGNOSIS — I2510 Atherosclerotic heart disease of native coronary artery without angina pectoris: Secondary | ICD-10-CM

## 2015-10-26 DIAGNOSIS — R062 Wheezing: Secondary | ICD-10-CM

## 2015-10-26 DIAGNOSIS — Z9861 Coronary angioplasty status: Secondary | ICD-10-CM

## 2015-10-26 DIAGNOSIS — Z955 Presence of coronary angioplasty implant and graft: Secondary | ICD-10-CM

## 2015-10-26 DIAGNOSIS — Z7982 Long term (current) use of aspirin: Secondary | ICD-10-CM

## 2015-10-26 DIAGNOSIS — J45909 Unspecified asthma, uncomplicated: Secondary | ICD-10-CM | POA: Diagnosis present

## 2015-10-26 DIAGNOSIS — F419 Anxiety disorder, unspecified: Secondary | ICD-10-CM | POA: Diagnosis present

## 2015-10-26 DIAGNOSIS — R079 Chest pain, unspecified: Secondary | ICD-10-CM

## 2015-10-26 DIAGNOSIS — I10 Essential (primary) hypertension: Secondary | ICD-10-CM | POA: Diagnosis present

## 2015-10-26 DIAGNOSIS — E785 Hyperlipidemia, unspecified: Secondary | ICD-10-CM | POA: Diagnosis present

## 2015-10-26 DIAGNOSIS — E039 Hypothyroidism, unspecified: Secondary | ICD-10-CM | POA: Diagnosis present

## 2015-10-26 DIAGNOSIS — M549 Dorsalgia, unspecified: Secondary | ICD-10-CM | POA: Diagnosis present

## 2015-10-26 DIAGNOSIS — F17200 Nicotine dependence, unspecified, uncomplicated: Secondary | ICD-10-CM | POA: Diagnosis present

## 2015-10-26 DIAGNOSIS — M1990 Unspecified osteoarthritis, unspecified site: Secondary | ICD-10-CM | POA: Diagnosis present

## 2015-10-26 LAB — BASIC METABOLIC PANEL
ANION GAP: 11 (ref 5–15)
BUN: 6 mg/dL (ref 6–20)
CALCIUM: 8.9 mg/dL (ref 8.9–10.3)
CO2: 27 mmol/L (ref 22–32)
CREATININE: 0.96 mg/dL (ref 0.44–1.00)
Chloride: 101 mmol/L (ref 101–111)
GFR calc Af Amer: >60 mL/min (ref >60)
GLUCOSE: 126 mg/dL — AB (ref 65–99)
Potassium: 4.2 mmol/L (ref 3.5–5.1)
Sodium: 139 mmol/L (ref 135–145)

## 2015-10-26 LAB — CBC
HCT: 42.4 % (ref 36.0–46.0)
HCT: 45.3 % (ref 36.0–46.0)
HEMOGLOBIN: 13.9 g/dL (ref 12.0–15.0)
Hemoglobin: 14.8 g/dL (ref 12.0–15.0)
MCH: 30.9 pg (ref 26.0–34.0)
MCH: 30.8 pg (ref 26.0–34.0)
MCHC: 32.7 g/dL (ref 30.0–36.0)
MCHC: 32.8 g/dL (ref 30.0–36.0)
MCV: 93.8 fL (ref 78.0–100.0)
MCV: 94.6 fL (ref 78.0–100.0)
PLATELETS: 337 10*3/uL (ref 150–400)
PLATELETS: 313 10*3/uL (ref 150–400)
RBC: 4.52 MIL/uL (ref 3.87–5.11)
RBC: 4.79 MIL/uL (ref 3.87–5.11)
RDW: 14 % (ref 11.5–15.5)
RDW: 14.1 % (ref 11.5–15.5)
WBC: 4.6 10*3/uL (ref 4.0–10.5)
WBC: 4.8 10*3/uL (ref 4.0–10.5)

## 2015-10-26 LAB — I-STAT TROPONIN, ED: TROPONIN I, POC: 0 ng/mL (ref 0.00–0.08)

## 2015-10-26 LAB — PROTIME-INR
INR: 0.93 (ref 0.00–1.49)
Prothrombin Time: 12.7 seconds (ref 11.6–15.2)

## 2015-10-26 MED ORDER — FLUTICASONE PROPIONATE 50 MCG/ACT NA SUSP
2.0000 | Freq: Every day | NASAL | Status: DC
  Administered 2015-10-27 – 2015-10-29 (×3): 2 via NASAL
  Filled 2015-10-26: qty 16

## 2015-10-26 MED ORDER — ALBUTEROL SULFATE (2.5 MG/3ML) 0.083% IN NEBU: 2.5000 mg | INHALATION_SOLUTION | RESPIRATORY_TRACT | Status: DC | PRN

## 2015-10-26 MED ORDER — NICOTINE 14 MG/24HR TD PT24
14.0000 mg | MEDICATED_PATCH | Freq: Every day | TRANSDERMAL | Status: DC
  Administered 2015-10-26 – 2015-10-30 (×5): 14 mg via TRANSDERMAL
  Filled 2015-10-26 (×5): qty 1

## 2015-10-26 MED ORDER — CYCLOBENZAPRINE HCL 10 MG PO TABS
10.0000 mg | ORAL_TABLET | Freq: Three times a day (TID) | ORAL | Status: DC | PRN
Start: 2015-10-26 — End: 2015-10-30
  Administered 2015-10-27 – 2015-10-29 (×4): 10 mg via ORAL
  Filled 2015-10-26 (×4): qty 1

## 2015-10-26 MED ORDER — MOMETASONE FURO-FORMOTEROL FUM 100-5 MCG/ACT IN AERO
2.0000 | INHALATION_SPRAY | Freq: Two times a day (BID) | RESPIRATORY_TRACT | Status: DC
  Administered 2015-10-27 – 2015-10-30 (×7): 2 via RESPIRATORY_TRACT
  Filled 2015-10-26: qty 8.8

## 2015-10-26 MED ORDER — DOXYCYCLINE HYCLATE 100 MG PO TABS
100.0000 mg | ORAL_TABLET | Freq: Two times a day (BID) | ORAL | Status: DC
  Administered 2015-10-26 – 2015-10-30 (×8): 100 mg via ORAL
  Filled 2015-10-26 (×8): qty 1

## 2015-10-26 MED ORDER — PANTOPRAZOLE SODIUM 40 MG PO TBEC: 40.0000 mg | DELAYED_RELEASE_TABLET | Freq: Every day | ORAL | Status: DC

## 2015-10-26 MED ORDER — ALBUTEROL (5 MG/ML) CONTINUOUS INHALATION SOLN
10.0000 mg/h | INHALATION_SOLUTION | RESPIRATORY_TRACT | Status: AC
  Administered 2015-10-26: 10 mg/h via RESPIRATORY_TRACT
  Filled 2015-10-26: qty 20

## 2015-10-26 MED ORDER — ALBUTEROL SULFATE (2.5 MG/3ML) 0.083% IN NEBU
2.5000 mg | INHALATION_SOLUTION | Freq: Four times a day (QID) | RESPIRATORY_TRACT | Status: DC
  Administered 2015-10-27 (×2): 2.5 mg via RESPIRATORY_TRACT
  Filled 2015-10-26 (×2): qty 3

## 2015-10-26 MED ORDER — ALBUTEROL SULFATE (2.5 MG/3ML) 0.083% IN NEBU
INHALATION_SOLUTION | RESPIRATORY_TRACT | Status: AC
  Filled 2015-10-26: qty 3

## 2015-10-26 MED ORDER — PREDNISONE 20 MG PO TABS
40.0000 mg | ORAL_TABLET | Freq: Every day | ORAL | Status: DC
  Administered 2015-10-27 – 2015-10-28 (×2): 40 mg via ORAL
  Filled 2015-10-26 (×2): qty 2

## 2015-10-26 MED ORDER — PRASUGREL HCL 10 MG PO TABS
10.0000 mg | ORAL_TABLET | Freq: Every day | ORAL | Status: DC
  Administered 2015-10-27 – 2015-10-30 (×4): 10 mg via ORAL
  Filled 2015-10-26 (×5): qty 1

## 2015-10-26 MED ORDER — LEVOTHYROXINE SODIUM 100 MCG PO TABS
100.0000 ug | ORAL_TABLET | Freq: Every day | ORAL | Status: DC
  Administered 2015-10-27 – 2015-10-30 (×4): 100 ug via ORAL
  Filled 2015-10-26 (×4): qty 1

## 2015-10-26 MED ORDER — ACETAMINOPHEN 650 MG RE SUPP: 650.0000 mg | Freq: Four times a day (QID) | RECTAL | Status: DC | PRN

## 2015-10-26 MED ORDER — ALBUTEROL SULFATE (2.5 MG/3ML) 0.083% IN NEBU
2.5000 mg | INHALATION_SOLUTION | Freq: Once | RESPIRATORY_TRACT | Status: AC
  Administered 2015-10-26: 2.5 mg via RESPIRATORY_TRACT

## 2015-10-26 MED ORDER — ENOXAPARIN SODIUM 40 MG/0.4ML ~~LOC~~ SOLN
40.0000 mg | SUBCUTANEOUS | Status: DC
  Administered 2015-10-26 – 2015-10-29 (×4): 40 mg via SUBCUTANEOUS
  Filled 2015-10-26 (×4): qty 0.4

## 2015-10-26 MED ORDER — ESCITALOPRAM OXALATE 10 MG PO TABS
10.0000 mg | ORAL_TABLET | Freq: Every day | ORAL | Status: DC
  Administered 2015-10-27 – 2015-10-30 (×4): 10 mg via ORAL
  Filled 2015-10-26 (×4): qty 1

## 2015-10-26 MED ORDER — LOSARTAN POTASSIUM 50 MG PO TABS
50.0000 mg | ORAL_TABLET | Freq: Every day | ORAL | Status: DC
  Administered 2015-10-27 – 2015-10-30 (×4): 50 mg via ORAL
  Filled 2015-10-26 (×4): qty 1

## 2015-10-26 MED ORDER — FUROSEMIDE 20 MG PO TABS
20.0000 mg | ORAL_TABLET | Freq: Every day | ORAL | Status: DC
  Administered 2015-10-27 – 2015-10-30 (×4): 20 mg via ORAL
  Filled 2015-10-26 (×4): qty 1

## 2015-10-26 MED ORDER — ONDANSETRON HCL 4 MG/2ML IJ SOLN: 4.0000 mg | Freq: Four times a day (QID) | INTRAMUSCULAR | Status: DC | PRN

## 2015-10-26 MED ORDER — TRIAMCINOLONE ACETONIDE 40 MG/ML IJ SUSP
INTRAMUSCULAR | Status: AC
  Filled 2015-10-26: qty 1

## 2015-10-26 MED ORDER — METHYLPREDNISOLONE SODIUM SUCC 125 MG IJ SOLR
125.0000 mg | Freq: Once | INTRAMUSCULAR | Status: AC
  Administered 2015-10-26: 125 mg via INTRAVENOUS
  Filled 2015-10-26: qty 2

## 2015-10-26 MED ORDER — ACETAMINOPHEN 325 MG PO TABS
650.0000 mg | ORAL_TABLET | Freq: Four times a day (QID) | ORAL | Status: DC | PRN
Start: 2015-10-26 — End: 2015-10-30

## 2015-10-26 MED ORDER — ONDANSETRON HCL 4 MG PO TABS: 4.0000 mg | ORAL_TABLET | Freq: Four times a day (QID) | ORAL | Status: DC | PRN

## 2015-10-26 MED ORDER — ATORVASTATIN CALCIUM 40 MG PO TABS
40.0000 mg | ORAL_TABLET | Freq: Every day | ORAL | Status: DC
  Administered 2015-10-27 – 2015-10-29 (×3): 40 mg via ORAL
  Filled 2015-10-26 (×3): qty 1

## 2015-10-26 MED ORDER — CLONAZEPAM 0.5 MG PO TABS
0.5000 mg | ORAL_TABLET | Freq: Every day | ORAL | Status: DC | PRN
  Administered 2015-10-27 – 2015-10-29 (×4): 0.5 mg via ORAL
  Filled 2015-10-26 (×4): qty 1

## 2015-10-26 MED ORDER — LABETALOL HCL 100 MG PO TABS
100.0000 mg | ORAL_TABLET | Freq: Two times a day (BID) | ORAL | Status: DC
  Administered 2015-10-26 – 2015-10-30 (×8): 100 mg via ORAL
  Filled 2015-10-26 (×8): qty 1

## 2015-10-26 MED ORDER — IPRATROPIUM-ALBUTEROL 0.5-2.5 (3) MG/3ML IN SOLN
RESPIRATORY_TRACT | Status: AC
  Filled 2015-10-26: qty 3

## 2015-10-26 MED ORDER — HYDROCODONE-HOMATROPINE 5-1.5 MG/5ML PO SYRP
5.0000 mL | ORAL_SOLUTION | Freq: Once | ORAL | Status: AC
  Administered 2015-10-26: 5 mL via ORAL
  Filled 2015-10-26: qty 5

## 2015-10-26 MED ORDER — MONTELUKAST SODIUM 10 MG PO TABS
10.0000 mg | ORAL_TABLET | Freq: Every day | ORAL | Status: DC
  Administered 2015-10-27 – 2015-10-29 (×3): 10 mg via ORAL
  Filled 2015-10-26 (×4): qty 1

## 2015-10-26 MED ORDER — IPRATROPIUM-ALBUTEROL 0.5-2.5 (3) MG/3ML IN SOLN
3.0000 mL | Freq: Once | RESPIRATORY_TRACT | Status: AC
  Administered 2015-10-26: 3 mL via RESPIRATORY_TRACT

## 2015-10-26 MED ORDER — SENNOSIDES-DOCUSATE SODIUM 8.6-50 MG PO TABS
1.0000 | ORAL_TABLET | Freq: Every evening | ORAL | Status: DC | PRN
  Administered 2015-10-28: 1 via ORAL
  Filled 2015-10-26: qty 1

## 2015-10-26 MED ORDER — GABAPENTIN 600 MG PO TABS
600.0000 mg | ORAL_TABLET | Freq: Four times a day (QID) | ORAL | Status: DC
Start: 2015-10-27 — End: 2015-10-30
  Administered 2015-10-27 – 2015-10-30 (×13): 600 mg via ORAL
  Filled 2015-10-26 (×14): qty 1

## 2015-10-26 MED ORDER — ACETAMINOPHEN 500 MG PO TABS
1000.0000 mg | ORAL_TABLET | Freq: Once | ORAL | Status: AC
  Administered 2015-10-26: 1000 mg via ORAL
  Filled 2015-10-26: qty 2

## 2015-10-26 MED ORDER — TRIAMCINOLONE ACETONIDE 40 MG/ML IJ SUSP
40.0000 mg | Freq: Once | INTRAMUSCULAR | Status: AC
  Administered 2015-10-26: 40 mg via INTRAMUSCULAR

## 2015-10-26 MED ORDER — GUAIFENESIN ER 600 MG PO TB12
600.0000 mg | ORAL_TABLET | Freq: Two times a day (BID) | ORAL | Status: DC
  Administered 2015-10-26 – 2015-10-30 (×8): 600 mg via ORAL
  Filled 2015-10-26 (×8): qty 1

## 2015-10-26 NOTE — H&P (Signed)
History and Physical  Patient Name: [name] [name]     [id number]    DOB: Apr 20, 1956    DOA: 10/26/2015 Referring physician: Rachael Fee, PA-C PCP: Angelica Chessman, MD      Chief Complaint: Dyspnea and cough  HPI: DHAMAR HYNDS is a 60 y.o. 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  The patient presents with 3 days cough, dyspnea, wheezing, chest tightness, wet cough without purulent sputum, subjective fevers/chills, and fatigue.  The patient developed a bronchitis 2 weeks ago, was treated with prednisone and Z-Pak to resolution. 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 On Monday the grandmother came home, and on Tuesday the patient started to develop her symptoms, which have been worsening since then to the point that she gets dyspneic walking from the chair to the bathroom, are not relieved with her home bronchi dilators, so she came to the ER tonight.  In the ED, she was saturating well on room air, tachycardic, low grade temperature 99.68F, and BP normal.  Na 139, K 4.2, Cr 0.9, WBC 4.6K, Hgb 14, troponin normal.  CXR showed no pneumonia.  ECG unremarkable.  She was administered albuterol continuously for an hour and solumedrol, but continued to complain of dyspnea and so TR[facility] were asked to evaluate for admission.  She smokes.     Review of Systems:  Pt complains of fever, cough, wet cough nonproductive, dyspnea, fatigue. Pt denies any orthopnea, leg swelling, rigors, confusion, syncope.  All other systems negative except as just noted or noted in the history of present illness.  Allergies  Allergen Reactions  . Other Anaphylaxis    avacdos  . Codeine Nausea Only  . Latex     REACTION: shortness of breath  and rash    Prior to Admission medications   Medication Sig Start Date End Date Taking? Authorizing Provider  albuterol (VENTOLIN HFA) 108 (90 BASE) MCG/ACT inhaler Inhale 1 puff into the lungs every 6 (six) hours as needed for wheezing or shortness of breath. 05/18/15  Yes Josalyn Funches, MD  atorvastatin (LIPITOR) 40 MG tablet TAKE 1 TABLET BY MOUTH ONCE DAILY 08/15/15  Yes Tresa Garter, MD  Calcium 600 MG tablet Take 600 mg by mouth daily.  07/14/13  Yes Patrecia Pour, NP  clobetasol cream (TEMOVATE) [id number] % Apply 1 application topically 2 (two) times daily. 06/22/15  Yes Olugbemiga Essie Christine, MD  clonazePAM (KLONOPIN) 0.5 MG tablet TAKE ONE TABLET BY MOUTH AT BEDTIME AS NEEDED FOR ANXIETY 10/16/15  Yes Tresa Garter, MD  cyclobenzaprine (FLEXERIL) 10 MG tablet Take 1 tablet (10 mg total) by mouth 3 (three) times daily as needed for muscle spasms. 06/22/15  Yes Tresa Garter, MD  escitalopram (LEXAPRO) 10 MG tablet Take 1 tablet (10 mg total) by mouth daily. 05/18/15  Yes Josalyn Funches, MD  fluticasone (FLONASE) 50 MCG/ACT nasal spray Place 2 sprays into both nostrils at bedtime. 05/18/15  Yes Josalyn Funches, MD  Fluticasone-Salmeterol (ADVAIR DISKUS) 100-50 MCG/DOSE AEPB Inhale 1 puff into the lungs 2 (two) times daily. 07/05/15  Yes Tresa Garter, MD  furosemide (LASIX) 20 MG tablet TAKE 1 TABLET BY MOUTH ONCE DAILY 08/15/15  Yes Tresa Garter, MD  gabapentin (NEURONTIN) 600 MG  tablet Take 1 tablet (600 mg total) by mouth 4 (four) times daily. 07/14/13  Yes Patrecia Pour, NP  ipratropium (ATROVENT) 0.02 % nebulizer solution Take 2.5 mLs (0.5 mg total) by nebulization every 4 (four) hours as needed for wheezing or shortness of breath. 08/19/13  Yes Gregor Hams, MD  labetalol (NORMODYNE) 100 MG tablet TAKE 1 TABLET BY MOUTH 2 TIMES DAILY. 08/21/15  Yes Tresa Garter, MD  levothyroxine (SYNTHROID, LEVOTHROID) 100 MCG tablet TAKE 1 TABLET BY MOUTH DAILY 08/21/15  Yes  Tresa Garter, MD  losartan (COZAAR) 50 MG tablet TAKE 1 TABLET BY MOUTH ONCE DAILY 08/15/15  Yes Olugbemiga E Jegede, MD  montelukast (SINGULAIR) 10 MG tablet TAKE 1 TABLET BY MOUTH AT BEDTIME. Patient taking differently: TAKE 1 TABLET BY MOUTH daily 05/18/15  Yes Josalyn Funches, MD  omeprazole (PRILOSEC) 20 MG capsule TAKE 2 CAPSULES BY MOUTH DAILY 09/22/15  Yes Tresa Garter, MD  prasugrel (EFFIENT) 10 MG TABS tablet Take 1 tablet (10 mg total) by mouth daily. 07/14/13  Yes Patrecia Pour, NP  traMADol (ULTRAM) 50 MG tablet Take 1 tablet (50 mg total) by mouth every 12 (twelve) hours as needed (cough). 10/17/15  Yes Tresa Garter, MD    Past Medical History  Diagnosis Date  . Thyroid disease   . Coronary artery disease   . Hernia   . Chronic back pain     herniated nucleus pulposus  . Hyperlipidemia     takes Lipitor daily  . Hypertension     takes Losartan daily and Labetalol bid  . Heart attack ([place]) 2011  . Asthma   . COPD (chronic obstructive pulmonary disease) ([place])     early stages  . Shortness of breath     with exertion  . Bronchitis     couple of months ago  . Arthritis   . Joint pain   . Neck pain   . Bruises easily     pt is on Effient  . Psoriasis     elbows,knees,back  . H/O hiatal hernia   . GERD (gastroesophageal reflux disease)     takes Nexium daily  . Hemorrhoids   . Diverticulosis   . Slowing of urinary stream   . Hypothyroidism     takes Synthroid daily  . Depression     takes Klonopin daily  . Anxiety     takes Lexapro daily  . Insomnia     hydroxyzine prn  . Bartholin gland cyst 08/29/2011    Past Surgical History  Procedure Laterality Date  . Abdominal hysterectomy  1977  . Laparoscopy  1977    exploratory  . Tonsillectomy      as a child  . Coronary angioplasty with stent placement  2011    x 2  . Colonoscopy    . Esophagogastroduodenscoy    . Lumbar laminectomy/decompression microdiscectomy  08/05/2011     Procedure: LUMBAR LAMINECTOMY/DECOMPRESSION MICRODISCECTOMY;  Surgeon: Otilio Connors, MD;  Location: [facility];  Service: Neurosurgery;  Laterality: Right;  Right Lumbar four-five extraforaminal discectomy    Family history: family history includes Anesthesia problems in her daughter; Cancer in her maternal aunt and paternal grandmother; Diabetes in her father; Heart disease in her father, maternal grandfather, and mother; Hypertension in her father and mother; Stroke in her mother; Thyroid disease in her paternal aunt. There is no history of Hypotension, Malignant hyperthermia, or Pseudochol deficiency.  Social History: Patient lives with her mother and  mother's roommate.  She is primary caregiver for her mother. She still is an active smoker.  She is independent with all ADLs and IADLs.  She does not use a cane or walker.  At baseline, she can do all housework without dyspnea.    Physical Exam: BP 130/73 mmHg  Pulse 85  Temp(Src) 99.3 F (37.4 C) (Oral)  Resp 26  SpO2 93% General appearance: Thin adult female, alert and in mild  distress from dyspnea.   Eyes: Mild icterus, conjunctiva pink, lids and lashes normal.     ENT: No nasal deformity, discharge, or epistaxis.  OP moist without lesions.  Edentulous. Lymph: No cervical or supraclavicular lymphadenopathy. Skin: Warm and dry.  No jaundice.  No suspicious rashes or lesions. Cardiac: RRR, nl S1-S2, no murmurs appreciated.  Capillary refill is brisk.  JVP normal.  No LE edema.  Radial and DP pulses 2+ and symmetric. Respiratory: Pursed lip breathing, tachypnea.  Diffuse expiratory rales.  Atelectasis at bases, no fremitus or dullness. Abdomen: Abdomen soft without rigidity.  No TTP. No ascites, distension.   MSK: No deformities or effusions. Neuro: Sensorium intact and responding to questions, attention normal.  Speech is fluent.  Moves all extremities equally and with normal coordination.    Psych: Behavior appropriate.  Affect  anxiuos.  No evidence of aural or visual hallucinations or delusions.       Labs on Admission:  The metabolic panel shows normal lites and renal function. INR normal. Troponin negative. The complete blood count shows no leukocytosis, anemia, or thrombocytopenia.   Radiological Exams on Admission: Personally reviewed: Dg Chest 2 View 10/26/2015 Hyperexpanded, no focal opacity.   EKG: Independently reviewed. Rate 91, QTC 457, no ST changes.    Assessment/Plan 1. COPD emphysema and asthma, with flare:  This is new.  Pulmonary function tests are not available in the computer. The patient appears to have frequent exacerbations and is an ongoing smoker. Recently treated with azithromycin and prednisone.   -Nebulized albuterol scheduled and when necessary -Prednisone 40 mg daily -Doxycycline 100 mg by mouth twice daily -Mucinex -Continue home Advair, montelukast, nasal steroid and PPI   2. HTN and CAD secondary prevention:  No chest pain. -Continue home with a low, statin, furosemide, prasugrel  3. Hypothyroidism:  -Continue home levothyroxine  4. Chronic pain and anxiety:  -Continue home clonazepam and cyclobenzaprine PRN -Tramadol PRN -Continue gabapentin  5. Smokking:  Cessation was recommended. -Smoking cessation nursing counseling -Nicotine patch while inpatient     DVT PPx: Lovenox Diet: Heart healthy Consultants: None Code Status: FULL Family Communication: daughter at bedside  Medical decision making: What exists of the patient's previous chart was reviewed in depth and the case was discussed with Quincy Carnes, PA-C. Patient seen 10:02 PM on 10/26/2015.  Disposition Plan:  I recommend admission to medical surgical unit, observation status for now.  Clinical condition: stable.  Anticipate bronchodilators and steroids and doxycycline for COPD flare.  Possible discharge tomorrow, depending on progress of dyspnea, and if develops fever.      Edwin Dada [facility] Pager [phone]

## 2015-10-26 NOTE — Progress Notes (Signed)
NURSING PROGRESS NOTE  CRIMSON KALICH [id number] Admission Data: 10/26/2015 11:06 PM Attending Provider: No att. providers found [id number], Gabrielle Dare, MD Code Status: Full  Allergies:  Other; Codeine; and Latex Past Medical History:   has a past medical history of Thyroid disease; Coronary artery disease; Hernia; Chronic back pain; Hyperlipidemia; Hypertension; Heart attack ([place]) (2011); Asthma; COPD (chronic obstructive pulmonary disease) ([place]); Shortness of breath; Bronchitis; Arthritis; Joint pain; Neck pain; Bruises easily; Psoriasis; H/O hiatal hernia; GERD (gastroesophageal reflux disease); Hemorrhoids; Diverticulosis; Slowing of urinary stream; Hypothyroidism; Depression; Anxiety; Insomnia; and Bartholin gland cyst (08/29/2011). Past Surgical History:   has past surgical history that includes Abdominal hysterectomy (1977); laparoscopy [id number]); Tonsillectomy; Coronary angioplasty with stent (2011); Colonoscopy; esophagogastroduodenscoy; and Lumbar laminectomy/decompression microdiscectomy (08/05/2011). Social History:   reports that she has been smoking Cigarettes.  She has a 44 pack-year smoking history. She does not have any smokeless tobacco history on file. She reports that she does not drink alcohol or use illicit drugs.  [name] [name] is a 60 y.o. female patient admitted from ED:   Last Documented Vital Signs: Blood pressure 120/71, pulse 84, temperature 96.6 F (35.9 C), temperature source Oral, resp. rate 18, SpO2 96 %.  IV Fluids:  IV in place, occlusive dsg intact without redness, IV cath forearm right, condition patent and no redness normal saline.   Skin: Appropriate for ethnicity, dry and intact. Psoriasis on elbows and knees.  Patient orientated to room. Information packet given to patient. Admission inpatient armband information verified with patient to include name and date of birth and placed on patient arm. Side rails up x 2, fall assessment and education completed with  patient. Patient able to verbalize understanding of risk associated with falls and verbalized understanding to call for assistance before getting out of bed. Call light within reach. Patient able to voice and demonstrate understanding of unit orientation instructions.    Will continue to evaluate and treat per MD orders.  Clydell Hakim RN, BSN

## 2015-10-26 NOTE — ED Notes (Signed)
Patient reports 2 day history of cough, cold, asthma exacerbation.  Reports breathing and asthma worsened last night. Patient is breathing fast.  Patient reports chest and back soreness with coughing.  Patient has been over using inhaler and nebs throughout the night.  Patient's breath sounds decreased or absent in bases, otherwise decreased with scant wheezing. Skin warm and dry and speaking in complete sentences.

## 2015-10-26 NOTE — ED Provider Notes (Signed)
CSN: [id number]     Arrival date & time 10/26/15  1259 History   First MD Initiated Contact with Patient 10/26/15 1316     Chief Complaint  Patient presents with  . Cough  . Asthma   (Consider location/radiation/quality/duration/timing/severity/associated sxs/prior Treatment) HPI Comments: 60 year old female states she is gasping for breath. She has progressive and moderate to severe COPD. She continues to smoke. She states that approximately 2 days ago she developed sinus symptoms including nasal congestion, runny nose and PND. Quickly following this she developed an increase in shortness of breath and "hard to get my breath". She has been using her nebulizers repeatedly one after the other. She is also using hand-held nebulizers. Her temperature is 99.3 degrees.  1415 hrs.: Reassessment, patient states that she feels absolutely terrible. She has   generalized fatigue as well as fatigue with breathing. She has myalgias and generalized weakness, headache and malaise.   Patient is a 60 y.o. female presenting with cough and asthma.  Cough Associated symptoms: chest pain, fever, myalgias, rhinorrhea, shortness of breath and wheezing   Associated symptoms: no sore throat   Asthma Associated symptoms include chest pain and shortness of breath.    Past Medical History  Diagnosis Date  . Thyroid disease   . Coronary artery disease   . Hernia   . Chronic back pain     herniated nucleus pulposus  . Hyperlipidemia     takes Lipitor daily  . Hypertension     takes Losartan daily and Labetalol bid  . Heart attack ([place]) 2011  . Asthma   . COPD (chronic obstructive pulmonary disease) ([place])     early stages  . Shortness of breath     with exertion  . Bronchitis     couple of months ago  . Arthritis   . Joint pain   . Neck pain   . Bruises easily     pt is on Effient  . Psoriasis     elbows,knees,back  . H/O hiatal hernia   . GERD (gastroesophageal reflux disease)     takes Nexium  daily  . Hemorrhoids   . Diverticulosis   . Slowing of urinary stream   . Hypothyroidism     takes Synthroid daily  . Depression     takes Klonopin daily  . Anxiety     takes Lexapro daily  . Insomnia     hydroxyzine prn  . Bartholin gland cyst 08/29/2011   Past Surgical History  Procedure Laterality Date  . Abdominal hysterectomy  1977  . Laparoscopy  1977    exploratory  . Tonsillectomy      as a child  . Coronary angioplasty with stent placement  2011    x 2  . Colonoscopy    . Esophagogastroduodenscoy    . Lumbar laminectomy/decompression microdiscectomy  08/05/2011    Procedure: LUMBAR LAMINECTOMY/DECOMPRESSION MICRODISCECTOMY;  Surgeon: Otilio Connors, MD;  Location: [facility];  Service: Neurosurgery;  Laterality: Right;  Right Lumbar four-five extraforaminal discectomy   Family History  Problem Relation Age of Onset  . Hypotension Neg Hx   . Malignant hyperthermia Neg Hx   . Pseudochol deficiency Neg Hx   . Anesthesia problems Daughter   . Heart disease Mother   . Hypertension Mother   . Stroke Mother   . Heart disease Father   . Hypertension Father   . Diabetes Father   . Cancer Maternal Aunt     breast  . Thyroid disease  Paternal Aunt   . Heart disease Maternal Grandfather   . Cancer Paternal Grandmother     mouth   Social History  Substance Use Topics  . Smoking status: Current Every Day Smoker -- 1.00 packs/day for 44 years    Types: Cigarettes  . Smokeless tobacco: None  . Alcohol Use: No     Comment: alcohol last night   OB History    Gravida Para Term Preterm AB TAB SAB Ectopic Multiple Living   1 1 1  0 0 0 0 0 0 1     Review of Systems  Constitutional: Positive for fever, activity change and fatigue.  HENT: Positive for congestion, postnasal drip and rhinorrhea. Negative for sore throat.   Respiratory: Positive for cough, shortness of breath and wheezing.   Cardiovascular: Positive for chest pain.       Chest pain esp with cough.   Gastrointestinal: Negative.   Musculoskeletal: Positive for myalgias.  Neurological: Negative.   All other systems reviewed and are negative.   Allergies  Other; Codeine; and Latex  Home Medications   Prior to Admission medications   Medication Sig Start Date End Date Taking? Authorizing Provider  albuterol (VENTOLIN HFA) 108 (90 BASE) MCG/ACT inhaler Inhale 1 puff into the lungs every 6 (six) hours as needed for wheezing or shortness of breath. 05/18/15   Boykin Nearing, MD  aspirin 325 MG tablet Take 1 tablet (325 mg total) by mouth daily. 07/14/13   Patrecia Pour, NP  atorvastatin (LIPITOR) 40 MG tablet TAKE 1 TABLET BY MOUTH ONCE DAILY 08/15/15   Tresa Garter, MD  azithromycin (ZITHROMAX) 250 MG tablet 2 tabs po on day one, then one tablet po once daily on days 2-5. 10/09/15   Janne Napoleon, NP  Calcium 600 MG tablet  07/14/13   Patrecia Pour, NP  carbamide peroxide (DEBROX) 6.5 % otic solution Place 5 drops into both ears 2 (two) times daily. For 5 days 05/18/15   Boykin Nearing, MD  clobetasol cream (TEMOVATE) [id number] % Apply 1 application topically 2 (two) times daily. 06/22/15   Tresa Garter, MD  clonazePAM (KLONOPIN) 0.5 MG tablet TAKE ONE TABLET BY MOUTH AT BEDTIME AS NEEDED FOR ANXIETY 10/16/15   Tresa Garter, MD  cyclobenzaprine (FLEXERIL) 10 MG tablet Take 1 tablet (10 mg total) by mouth 3 (three) times daily as needed for muscle spasms. 06/22/15   Tresa Garter, MD  ergocalciferol (VITAMIN D2) 50000 UNITS capsule Take 1 capsule (50,000 Units total) by mouth once a week. 08/24/14   Tresa Garter, MD  escitalopram (LEXAPRO) 10 MG tablet Take 1 tablet (10 mg total) by mouth daily. 05/18/15   Josalyn Funches, MD  fluticasone (FLONASE) 50 MCG/ACT nasal spray Place 2 sprays into both nostrils at bedtime. 05/18/15   Josalyn Funches, MD  Fluticasone-Salmeterol (ADVAIR DISKUS) 100-50 MCG/DOSE AEPB Inhale 1 puff into the lungs 2 (two) times daily. 07/05/15    Tresa Garter, MD  furosemide (LASIX) 20 MG tablet TAKE 1 TABLET BY MOUTH ONCE DAILY 08/15/15   Tresa Garter, MD  gabapentin (NEURONTIN) 600 MG tablet Take 1 tablet (600 mg total) by mouth 4 (four) times daily. 07/14/13   Patrecia Pour, NP  HYDROcodone-acetaminophen (NORCO/VICODIN) 5-325 MG tablet Take 2 tablets by mouth every 4 (four) hours as needed for moderate pain. 09/05/15   Melynda Ripple, MD  ipratropium (ATROVENT) 0.02 % nebulizer solution Take 2.5 mLs (0.5 mg total) by nebulization every 4 (four) hours  as needed for wheezing or shortness of breath. 08/19/13   Gregor Hams, MD  labetalol (NORMODYNE) 100 MG tablet TAKE 1 TABLET BY MOUTH 2 TIMES DAILY. 08/21/15   Tresa Garter, MD  levothyroxine (SYNTHROID, LEVOTHROID) 100 MCG tablet TAKE 1 TABLET BY MOUTH DAILY 08/21/15   Tresa Garter, MD  loratadine (CLARITIN) 10 MG tablet Take 1 tablet (10 mg total) by mouth daily. 06/22/15   Tresa Garter, MD  losartan (COZAAR) 50 MG tablet TAKE 1 TABLET BY MOUTH ONCE DAILY 08/15/15   Tresa Garter, MD  montelukast (SINGULAIR) 10 MG tablet TAKE 1 TABLET BY MOUTH AT BEDTIME. 05/18/15   Josalyn Funches, MD  ofloxacin (FLOXIN) 0.3 % otic solution Place 5 drops into the right ear 2 (two) times daily. X 7 days 09/05/15   Melynda Ripple, MD  omeprazole (PRILOSEC) 20 MG capsule TAKE 2 CAPSULES BY MOUTH DAILY 09/22/15   Tresa Garter, MD  prasugrel (EFFIENT) 10 MG TABS tablet Take 1 tablet (10 mg total) by mouth daily. 07/14/13   Patrecia Pour, NP  predniSONE (DELTASONE) 20 MG tablet 3 Tabs PO Days 1-3, then 2 tabs PO Days 4-6, then 1 tab PO Day 7-9, then Half Tab PO Day 10-12 10/09/15   Janne Napoleon, NP  traMADol (ULTRAM) 50 MG tablet Take 1 tablet (50 mg total) by mouth every 12 (twelve) hours as needed (cough). 10/17/15   Tresa Garter, MD  varenicline (CHANTIX STARTING MONTH PAK) 0.5 MG X 11 & 1 MG X 42 tablet Take one 0.5 mg tablet by mouth once daily for 3 days, then  increase to one 0.5 mg tablet twice daily for 4 days, then increase to one 1 mg tablet twice daily. 05/18/15   Boykin Nearing, MD   Meds Ordered and Administered this Visit   Medications  ipratropium-albuterol (DUONEB) 0.5-2.5 (3) MG/3ML nebulizer solution 3 mL (3 mLs Nebulization Given 10/26/15 1352)  albuterol (PROVENTIL) (2.5 MG/3ML) 0.083% nebulizer solution 2.5 mg (2.5 mg Nebulization Given 10/26/15 1352)  albuterol (PROVENTIL) (2.5 MG/3ML) 0.083% nebulizer solution 2.5 mg (2.5 mg Nebulization Given 10/26/15 1430)  triamcinolone acetonide (KENALOG-40) injection 40 mg (40 mg Intramuscular Given 10/26/15 1430)    BP 124/82 mmHg  Pulse 90  Temp(Src) 99.3 F (37.4 C) (Oral)  Resp 24  SpO2 91% No data found.   Physical Exam  Constitutional: She is oriented to person, place, and time. She appears well-developed and well-nourished.  Eyes: EOM are normal.  Neck: Normal range of motion. Neck supple.  Cardiovascular: Normal rate and intact distal pulses.   Heart sounds are distant and difficult to hear under adventitious sounds of the lungs. S1 and S2 faint.  Pulmonary/Chest: She has wheezes.  Respiratory effort has increased. She  has short quick breaths and with poor air movement. Few scattered crackles greatest in the left midlung field.  Neurological: She is alert and oriented to person, place, and time. She exhibits normal muscle tone.  Skin: Skin is warm and dry.  Facial color light ashen.  Psychiatric: She has a normal mood and affect.  Nursing note and vitals reviewed.   ED Course  Procedures (including critical care time)  Labs Review Labs Reviewed - No data to display  Imaging Review Dg Chest 2 View  10/26/2015  CLINICAL DATA:  Chest pain.  Fever and short of breath.  COPD EXAM: CHEST  2 VIEW COMPARISON:  08/21/2015 FINDINGS: COPD with pulmonary hyperinflation. Negative for infiltrate or effusion. Negative for mass  lesion. Mild scarring in the left lung base is stable.  Heart size is normal.  No heart failure.  Right coronary stent. Chronic fracture deformity left clavicle IMPRESSION: COPD without acute cardiopulmonary abnormality. Electronically Signed   By: Franchot Gallo M.D.   On: 10/26/2015 13:46     Visual Acuity Review  Right Eye Distance:   Left Eye Distance:   Bilateral Distance:    Right Eye Near:   Left Eye Near:    Bilateral Near:         MDM   1. Low oxygen saturation   2. Dyspnea   3. Other emphysema ([place])   4. Chest pain, unspecified chest pain type     Post DuoNeb the patient states that she feels her breathing is a little better. Her lungs are more clear, less wheezing and mild improvement in air movement. There continues to be faint fine basilar crackles and decrease in chest expansion. As per above the chest x-ray shows no acute cardiopulmonary disease, infiltrates or masses.  I am concerned about the patient's appearance, her increased effort and breathing, ashen appearance, Chest pain and her pulse ox of 90-91%. Researching previous visits to the urgent care and emergency Department show saturations at 96% or better even when having COPD exacerbations.  No measurable improvement after the second nebulizer. Concern for other etiology for dyspnea and hypoxia. Differential would include PE, cardiac. Meds ordered this encounter  Medications  . ipratropium-albuterol (DUONEB) 0.5-2.5 (3) MG/3ML nebulizer solution 3 mL    Sig:   . albuterol (PROVENTIL) (2.5 MG/3ML) 0.083% nebulizer solution 2.5 mg    Sig:   . albuterol (PROVENTIL) (2.5 MG/3ML) 0.083% nebulizer solution 2.5 mg    Sig:   . triamcinolone acetonide (KENALOG-40) injection 40 mg    Sig:      Janne Napoleon, NP 10/26/15 1451

## 2015-10-26 NOTE — ED Notes (Signed)
Family at bedside. 

## 2015-10-26 NOTE — ED Notes (Signed)
Patient transported to X-ray 

## 2015-10-26 NOTE — ED Provider Notes (Signed)
CSN: [id number]     Arrival date & time 10/26/15  1532 History   First MD Initiated Contact with Patient 10/26/15 1901     Chief Complaint  Patient presents with  . Shortness of Breath     (Consider location/radiation/quality/duration/timing/severity/associated sxs/prior Treatment) Patient is a 60 y.o. female presenting with shortness of breath. The history is provided by the patient and medical records.  Shortness of Breath Associated symptoms: cough and wheezing     60 year old female with history of coronary artery disease, hypertension, asthma, COPD, arthritis, psoriasis, hyperlipidemia, presenting to the ED for shortness of breath. Patient states for the past 2 days she has had worsening symptoms of shortness of breath. She states she is somewhat always short of breath, but has been drastically worse over the past 2 days. She reports a nonproductive cough, nasal congestion, and postnasal drip. She states over the past 2 days she has been using her albuterol inhaler repetitively as well as her other daily inhalers without significant relief. She went to urgent care and was given 2 nebulizer treatments without improvement. Her oxygen saturation at urgent care was borderline around 91% and she was sent here for further evaluation.  She states her COPD is moderate to severe. She is not seeing a pulmonologist in several years. She last took prednisone a few weeks ago. She reports her mother who has been sick with pneumonia recently visited her.  Patient denies fever, chills, sweats.  No recent travel, prolonged immobilization, lower extremity swelling, or calf pain. No history of DVT or PE. Not currently on exogenous estrogens.  Past Medical History  Diagnosis Date  . Thyroid disease   . Coronary artery disease   . Hernia   . Chronic back pain     herniated nucleus pulposus  . Hyperlipidemia     takes Lipitor daily  . Hypertension     takes Losartan daily and Labetalol bid  . Heart attack  ([place]) 2011  . Asthma   . COPD (chronic obstructive pulmonary disease) ([place])     early stages  . Shortness of breath     with exertion  . Bronchitis     couple of months ago  . Arthritis   . Joint pain   . Neck pain   . Bruises easily     pt is on Effient  . Psoriasis     elbows,knees,back  . H/O hiatal hernia   . GERD (gastroesophageal reflux disease)     takes Nexium daily  . Hemorrhoids   . Diverticulosis   . Slowing of urinary stream   . Hypothyroidism     takes Synthroid daily  . Depression     takes Klonopin daily  . Anxiety     takes Lexapro daily  . Insomnia     hydroxyzine prn  . Bartholin gland cyst 08/29/2011   Past Surgical History  Procedure Laterality Date  . Abdominal hysterectomy  1977  . Laparoscopy  1977    exploratory  . Tonsillectomy      as a child  . Coronary angioplasty with stent placement  2011    x 2  . Colonoscopy    . Esophagogastroduodenscoy    . Lumbar laminectomy/decompression microdiscectomy  08/05/2011    Procedure: LUMBAR LAMINECTOMY/DECOMPRESSION MICRODISCECTOMY;  Surgeon: Otilio Connors, MD;  Location: [facility];  Service: Neurosurgery;  Laterality: Right;  Right Lumbar four-five extraforaminal discectomy   Family History  Problem Relation Age of Onset  . Hypotension Neg Hx   .  Malignant hyperthermia Neg Hx   . Pseudochol deficiency Neg Hx   . Anesthesia problems Daughter   . Heart disease Mother   . Hypertension Mother   . Stroke Mother   . Heart disease Father   . Hypertension Father   . Diabetes Father   . Cancer Maternal Aunt     breast  . Thyroid disease Paternal Aunt   . Heart disease Maternal Grandfather   . Cancer Paternal Grandmother     mouth   Social History  Substance Use Topics  . Smoking status: Current Every Day Smoker -- 1.00 packs/day for 44 years    Types: Cigarettes  . Smokeless tobacco: None  . Alcohol Use: No     Comment: alcohol last night   OB History    Gravida Para Term Preterm AB TAB  SAB Ectopic Multiple Living   1 1 1  0 0 0 0 0 0 1     Review of Systems  Respiratory: Positive for cough, shortness of breath and wheezing.   All other systems reviewed and are negative.     Allergies  Other; Codeine; and Latex  Home Medications   Prior to Admission medications   Medication Sig Start Date End Date Taking? Authorizing Provider  albuterol (VENTOLIN HFA) 108 (90 BASE) MCG/ACT inhaler Inhale 1 puff into the lungs every 6 (six) hours as needed for wheezing or shortness of breath. 05/18/15   Boykin Nearing, MD  aspirin 325 MG tablet Take 1 tablet (325 mg total) by mouth daily. 07/14/13   Patrecia Pour, NP  atorvastatin (LIPITOR) 40 MG tablet TAKE 1 TABLET BY MOUTH ONCE DAILY 08/15/15   Tresa Garter, MD  azithromycin (ZITHROMAX) 250 MG tablet 2 tabs po on day one, then one tablet po once daily on days 2-5. 10/09/15   Janne Napoleon, NP  Calcium 600 MG tablet  07/14/13   Patrecia Pour, NP  carbamide peroxide (DEBROX) 6.5 % otic solution Place 5 drops into both ears 2 (two) times daily. For 5 days 05/18/15   Boykin Nearing, MD  clobetasol cream (TEMOVATE) [id number] % Apply 1 application topically 2 (two) times daily. 06/22/15   Tresa Garter, MD  clonazePAM (KLONOPIN) 0.5 MG tablet TAKE ONE TABLET BY MOUTH AT BEDTIME AS NEEDED FOR ANXIETY 10/16/15   Tresa Garter, MD  cyclobenzaprine (FLEXERIL) 10 MG tablet Take 1 tablet (10 mg total) by mouth 3 (three) times daily as needed for muscle spasms. 06/22/15   Tresa Garter, MD  ergocalciferol (VITAMIN D2) 50000 UNITS capsule Take 1 capsule (50,000 Units total) by mouth once a week. 08/24/14   Tresa Garter, MD  escitalopram (LEXAPRO) 10 MG tablet Take 1 tablet (10 mg total) by mouth daily. 05/18/15   Josalyn Funches, MD  fluticasone (FLONASE) 50 MCG/ACT nasal spray Place 2 sprays into both nostrils at bedtime. 05/18/15   Josalyn Funches, MD  Fluticasone-Salmeterol (ADVAIR DISKUS) 100-50 MCG/DOSE AEPB Inhale 1 puff into  the lungs 2 (two) times daily. 07/05/15   Tresa Garter, MD  furosemide (LASIX) 20 MG tablet TAKE 1 TABLET BY MOUTH ONCE DAILY 08/15/15   Tresa Garter, MD  gabapentin (NEURONTIN) 600 MG tablet Take 1 tablet (600 mg total) by mouth 4 (four) times daily. 07/14/13   Patrecia Pour, NP  HYDROcodone-acetaminophen (NORCO/VICODIN) 5-325 MG tablet Take 2 tablets by mouth every 4 (four) hours as needed for moderate pain. 09/05/15   Melynda Ripple, MD  ipratropium (ATROVENT) 0.02 % nebulizer  solution Take 2.5 mLs (0.5 mg total) by nebulization every 4 (four) hours as needed for wheezing or shortness of breath. 08/19/13   Gregor Hams, MD  labetalol (NORMODYNE) 100 MG tablet TAKE 1 TABLET BY MOUTH 2 TIMES DAILY. 08/21/15   Tresa Garter, MD  levothyroxine (SYNTHROID, LEVOTHROID) 100 MCG tablet TAKE 1 TABLET BY MOUTH DAILY 08/21/15   Tresa Garter, MD  loratadine (CLARITIN) 10 MG tablet Take 1 tablet (10 mg total) by mouth daily. 06/22/15   Tresa Garter, MD  losartan (COZAAR) 50 MG tablet TAKE 1 TABLET BY MOUTH ONCE DAILY 08/15/15   Tresa Garter, MD  montelukast (SINGULAIR) 10 MG tablet TAKE 1 TABLET BY MOUTH AT BEDTIME. 05/18/15   Josalyn Funches, MD  ofloxacin (FLOXIN) 0.3 % otic solution Place 5 drops into the right ear 2 (two) times daily. X 7 days 09/05/15   Melynda Ripple, MD  omeprazole (PRILOSEC) 20 MG capsule TAKE 2 CAPSULES BY MOUTH DAILY 09/22/15   Tresa Garter, MD  prasugrel (EFFIENT) 10 MG TABS tablet Take 1 tablet (10 mg total) by mouth daily. 07/14/13   Patrecia Pour, NP  predniSONE (DELTASONE) 20 MG tablet 3 Tabs PO Days 1-3, then 2 tabs PO Days 4-6, then 1 tab PO Day 7-9, then Half Tab PO Day 10-12 10/09/15   Janne Napoleon, NP  traMADol (ULTRAM) 50 MG tablet Take 1 tablet (50 mg total) by mouth every 12 (twelve) hours as needed (cough). 10/17/15   Tresa Garter, MD  varenicline (CHANTIX STARTING MONTH PAK) 0.5 MG X 11 & 1 MG X 42 tablet Take one 0.5 mg tablet  by mouth once daily for 3 days, then increase to one 0.5 mg tablet twice daily for 4 days, then increase to one 1 mg tablet twice daily. 05/18/15   Josalyn Funches, MD   BP 125/62 mmHg  Pulse 85  Temp(Src) 99.4 F (37.4 C) (Oral)  Resp 17  SpO2 94%   Physical Exam  Constitutional: She is oriented to person, place, and time. She appears well-developed and well-nourished. No distress.  HENT:  Head: Normocephalic and atraumatic.  Mouth/Throat: Oropharynx is clear and moist.  Eyes: Conjunctivae and EOM are normal. Pupils are equal, round, and reactive to light.  Neck: Normal range of motion. Neck supple.  Cardiovascular: Normal rate, regular rhythm and normal heart sounds.   Pulmonary/Chest: Effort normal. No respiratory distress. She has wheezes. She has no rhonchi.  Diffuse inspiratory and expiratory wheezing throughout, increased work of breathing, dry cough noted, speaking in short sentences, O2 sats 94% on RA currently  Abdominal: Soft. Bowel sounds are normal. There is no tenderness. There is no guarding.  Musculoskeletal: Normal range of motion. She exhibits no edema.  No calf swelling, asymmetry, tenderness, or palpable cords; no overlying skin changes or warmth to touch; DP pulses intact bilaterally No pitting edema  Neurological: She is alert and oriented to person, place, and time.  Skin: Skin is warm and dry. She is not diaphoretic.  Psychiatric: She has a normal mood and affect.  Nursing note and vitals reviewed.   ED Course  Procedures (including critical care time)  CRITICAL CARE Performed by: Larene Pickett   Total critical care time: 45 minutes  Critical care time was exclusive of separately billable procedures and treating other patients.  Critical care was necessary to treat or prevent imminent or life-threatening deterioration.  Critical care was time spent personally by me on the following activities: development  of treatment plan with patient and/or  surrogate as well as nursing, discussions with consultants, evaluation of patient's response to treatment, examination of patient, obtaining history from patient or surrogate, ordering and performing treatments and interventions, ordering and review of laboratory studies, ordering and review of radiographic studies, pulse oximetry and re-evaluation of patient's condition.   Medications  albuterol (PROVENTIL,VENTOLIN) solution continuous neb (0 mg/hr Nebulization Stopped 10/26/15 2124)  methylPREDNISolone sodium succinate (SOLU-MEDROL) 125 mg/2 mL injection 125 mg (125 mg Intravenous Given 10/26/15 1921)  HYDROcodone-homatropine (HYCODAN) 5-1.5 MG/5ML syrup 5 mL (5 mLs Oral Given 10/26/15 2204)  acetaminophen (TYLENOL) tablet 1,000 mg (1,000 mg Oral Given 10/26/15 2204)    Labs Review Labs Reviewed  BASIC METABOLIC PANEL - Abnormal; Notable for the following:    Glucose, Bld 126 (*)    All other components within normal limits  CBC  PROTIME-INR  Randolm Idol, ED    Imaging Review Dg Chest 2 View  10/26/2015  CLINICAL DATA:  Chest pain.  Fever and short of breath.  COPD EXAM: CHEST  2 VIEW COMPARISON:  08/21/2015 FINDINGS: COPD with pulmonary hyperinflation. Negative for infiltrate or effusion. Negative for mass lesion. Mild scarring in the left lung base is stable. Heart size is normal.  No heart failure.  Right coronary stent. Chronic fracture deformity left clavicle IMPRESSION: COPD without acute cardiopulmonary abnormality. Electronically Signed   By: Franchot Gallo M.D.   On: 10/26/2015 13:46   I have personally reviewed and evaluated these images and lab results as part of my medical decision-making.   EKG Interpretation None      MDM   Final diagnoses:  COPD exacerbation [facility])  Wheezing   60 year old female here with cough and shortness of breath. She has been wheezing significantly over the past 2 days. History of asthma and severe COPD. Seen at urgent care and sent here  due to lack of improvement in their facility. On exam, patient does have some increased work of breathing but she is not in any severe distress. She is able to speak in short sentences. She does have diffuse inspiratory and expiratory wheezes. She denies any chest pain. Patient does not have any exam findings concerning for DVT and no risk factors for PE so I feel this is less likely. I feel that her symptoms today are likely due to her severe COPD. Labs reviewed, no acute findings.  CXR clear.  Patient will be started on hour-long nebulizer treatment and given dose of Solu-Medrol.  8:48 PM After hour long neb and solu-medrol patient with minimal improvement.  She continues to have inspiratory and expiratory wheezes.  Her O2 sats have maintained around 94-95% on RA.  Continues to deny any chest pain.  At this time, feel patient will need admission until her breathing is better stabilized.  Case discussed with hospitalist, Dr. Loleta Books, who will evaluate in the ED and admit.  Patient has been admitted to the hospitalist service.  Larene Pickett, PA-C 10/26/15 [id number]  Charlesetta Shanks, MD 10/27/15 0040

## 2015-10-26 NOTE — ED Notes (Signed)
Admitting at bedside 

## 2015-10-26 NOTE — ED Notes (Signed)
Pt sent here from [facility] to rule out PE. She went to [facility] for shortness of breath and chest pain and weakness. Hx of asthma and COPD. Chest xray done today at Urgent care and revealed no acute cardiopulmonary disease, infiltrates or masses. O2 92% RA,.

## 2015-10-27 DIAGNOSIS — I2510 Atherosclerotic heart disease of native coronary artery without angina pectoris: Secondary | ICD-10-CM

## 2015-10-27 DIAGNOSIS — M549 Dorsalgia, unspecified: Secondary | ICD-10-CM

## 2015-10-27 DIAGNOSIS — K219 Gastro-esophageal reflux disease without esophagitis: Secondary | ICD-10-CM

## 2015-10-27 DIAGNOSIS — I10 Essential (primary) hypertension: Secondary | ICD-10-CM

## 2015-10-27 DIAGNOSIS — J441 Chronic obstructive pulmonary disease with (acute) exacerbation: Principal | ICD-10-CM

## 2015-10-27 DIAGNOSIS — Z9861 Coronary angioplasty status: Secondary | ICD-10-CM

## 2015-10-27 DIAGNOSIS — J439 Emphysema, unspecified: Secondary | ICD-10-CM

## 2015-10-27 DIAGNOSIS — G8929 Other chronic pain: Secondary | ICD-10-CM

## 2015-10-27 LAB — CBC
HEMATOCRIT: 42.9 % (ref 36.0–46.0)
Hemoglobin: 13.9 g/dL (ref 12.0–15.0)
MCH: 30.4 pg (ref 26.0–34.0)
MCHC: 32.4 g/dL (ref 30.0–36.0)
MCV: 93.9 fL (ref 78.0–100.0)
Platelets: 322 10*3/uL (ref 150–400)
RBC: 4.57 MIL/uL (ref 3.87–5.11)
RDW: 14.1 % (ref 11.5–15.5)
WBC: 2.4 10*3/uL — AB (ref 4.0–10.5)

## 2015-10-27 LAB — CREATININE, SERUM
Creatinine, Ser: 1.05 mg/dL — ABNORMAL HIGH (ref 0.44–1.00)
GFR, EST NON AFRICAN AMERICAN: 57 mL/min — AB (ref >60)

## 2015-10-27 LAB — BASIC METABOLIC PANEL
ANION GAP: 10 (ref 5–15)
BUN: 16 mg/dL (ref 6–20)
CALCIUM: 9.1 mg/dL (ref 8.9–10.3)
CO2: 28 mmol/L (ref 22–32)
Chloride: 101 mmol/L (ref 101–111)
Creatinine, Ser: 1.13 mg/dL — ABNORMAL HIGH (ref 0.44–1.00)
GFR calc Af Amer: >60 mL/min (ref >60)
GFR, EST NON AFRICAN AMERICAN: 52 mL/min — AB (ref >60)
GLUCOSE: 178 mg/dL — AB (ref 65–99)
POTASSIUM: 4.6 mmol/L (ref 3.5–5.1)
SODIUM: 139 mmol/L (ref 135–145)

## 2015-10-27 MED ORDER — ALBUTEROL SULFATE (2.5 MG/3ML) 0.083% IN NEBU
2.5000 mg | INHALATION_SOLUTION | Freq: Three times a day (TID) | RESPIRATORY_TRACT | Status: DC
  Administered 2015-10-27 – 2015-10-28 (×3): 2.5 mg via RESPIRATORY_TRACT
  Filled 2015-10-27 (×3): qty 3

## 2015-10-27 MED ORDER — PREDNISONE 20 MG PO TABS: 40.0000 mg | ORAL_TABLET | Freq: Every day | ORAL | Status: DC

## 2015-10-27 MED ORDER — PANTOPRAZOLE SODIUM 40 MG PO TBEC
40.0000 mg | DELAYED_RELEASE_TABLET | Freq: Every day | ORAL | Status: DC
  Administered 2015-10-27 – 2015-10-29 (×4): 40 mg via ORAL
  Filled 2015-10-27 (×4): qty 1

## 2015-10-27 MED ORDER — ALBUTEROL SULFATE (2.5 MG/3ML) 0.083% IN NEBU: 2.5000 mg | INHALATION_SOLUTION | RESPIRATORY_TRACT | Status: DC | PRN

## 2015-10-27 NOTE — Care Management Note (Signed)
Case Management Note  Patient Details  Name: [name] [name] MRN: [id number] Date of Birth: [birth year]  Subjective/Objective:                 Spoke to patient in room. She states that she goes to [facility] and to [place]. She states that between the two places she gets all of her medication. She states that on occasion she has some difficulties paying for some of them. She has a nebulizer, but does not use oxygen at home.    Action/Plan:  No CM needs identified at this time. Expected Discharge Date:                  Expected Discharge Plan:  Home/Self Care  In-House Referral:     Discharge planning Services  CM Consult  Post Acute Care Choice:  NA Choice offered to:     DME Arranged:    DME Agency:     HH Arranged:    HH Agency:     Status of Service:  In process, will continue to follow  Medicare Important Message Given:    Date Medicare IM Given:    Medicare IM give by:    Date Additional Medicare IM Given:    Additional Medicare Important Message give by:     If discussed at [place] of Stay Meetings, dates discussed:    Additional Comments:  Carles Collet, RN 10/27/2015, 11:25 AM

## 2015-10-27 NOTE — Progress Notes (Signed)
[facility]                                                                              Patient Demographics  [name] [name], is a 60 y.o. female, DOB - 09-29-1955, [id number]  Admit date - 10/26/2015   Admitting Physician Edwin Dada, MD  Outpatient Primary MD for the patient is Angelica Chessman, MD  LOS -   days    Chief Complaint  Patient presents with  . 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 The patient developed a bronchitis 2 weeks ago, was treated with prednisone and Z-Pak to resolution. Per patient, she was visiting her grandmother in the hospital several times who was admitted with pneumonia. In ED, patient was tachycardiac with low-grade temp  CXR showed no pneumonia.Patient was admitted for further workup.    Assessment & Plan     COPD emphysema and asthma, with flare: With ongoing nicotine abuse - Recently treated with Zithromax, prednisone. - Continue albuterol bronchodilator, prednisone, doxycycline, Mucinex,  - continue home Advair, montelukast, nasal steroid and PPI    HTN and CAD secondary prevention:  No chest pain. -Continue statin, furosemide, prasugrel  Hypothyroidism:  -Continue home levothyroxine  Chronic pain and anxiety:  -Continue home clonazepam and cyclobenzaprine PRN -Tramadol PRN -Continue gabapentin  Nicotine abuse Cessation was recommended. -Smoking cessation nursing counseling -Nicotine patch while inpatient   Code Status: full code  Family Communication: Discussed in detail with the patient, all imaging results, lab results explained to the patient    Disposition Plan: PT evaluation, hopefully DC home in  a.m.  Time Spent in minutes  25 minutes  Procedures  None   Consults   None   DVT Prophylaxis  Lovenox   Medications  Scheduled Meds: . albuterol  2.5 mg Nebulization TID  . atorvastatin  40 mg Oral q1800  . doxycycline  100 mg Oral Q12H  . enoxaparin (LOVENOX) injection  40 mg Subcutaneous Q24H  . escitalopram  10 mg Oral Daily  . fluticasone  2 spray Each Nare QHS  . furosemide  20 mg Oral Daily  . gabapentin  600 mg Oral QID  . guaiFENesin  600 mg Oral BID  . labetalol  100 mg Oral BID  . levothyroxine  100 mcg Oral QAC breakfast  . losartan  50 mg Oral Daily  . mometasone-formoterol  2 puff Inhalation BID  . montelukast  10 mg Oral QHS  . nicotine  14 mg Transdermal Daily  . pantoprazole  40 mg Oral Daily  . prasugrel  10 mg Oral Daily  . predniSONE  40 mg Oral Q breakfast   Continuous Infusions:  PRN Meds:.acetaminophen **OR** acetaminophen, albuterol, clonazePAM, cyclobenzaprine, ondansetron **OR** ondansetron (ZOFRAN) IV, senna-docusate   Antibiotics   Anti-infectives    Start     Dose/Rate Route Frequency Ordered Stop   10/26/15 2330  doxycycline (VIBRA-TABS) tablet 100 mg     100 mg Oral Every 12 hours 10/26/15 2303          Subjective:   [name] [name] was seen and examined today.Shortness of breath and wheezing improving, still feels frail and very agitated.  Patient denies dizziness, chest pain,abdominal pain, N/V/D/C, new weakness, numbess, tingling. No acute events overnight.    Objective:   Filed Vitals:   10/27/15 0212 10/27/15 0547 10/27/15 0732 10/27/15 0736  BP:  114/65    Pulse:  65    Temp:  97.6 F (36.4 C)    TempSrc:      Resp:  18    Height:  5\' 1"  (1.549 m)    Weight:  62.279 kg (137 lb 4.8 oz)    SpO2: 93% 91% 93% 93%    Intake/Output Summary (Last 24 hours) at 10/27/15 1236 Last data filed at 10/27/15 0640  Gross per 24 hour  Intake    240 ml  Output      0 ml  Net    240 ml     Wt Readings from Last 3 Encounters:    10/27/15 62.279 kg (137 lb 4.8 oz)  09/11/15 65.137 kg (143 lb 9.6 oz)  06/22/15 65.953 kg (145 lb 6.4 oz)     Exam  General: Alert and oriented x 3, NAD  HEENT:  PERRLA, EOMI, Anicteric Sclera, mucous membranes moist.   Neck: Supple, no JVD, no masses  CVS: S1 S2 auscultated, no rubs, murmurs or gallops. Regular rate and rhythm.  Respiratory: Mild scattered wheezing bilaterally  Abdomen: Soft, nontender, nondistended, + bowel sounds  Ext: no cyanosis clubbing or edema  Neuro: AAOx3, Cr N's II- XII. Strength 5/5 upper and lower extremities bilaterally  Skin: No rashes  Psych: Normal affect and demeanor, alert and oriented x3    Data Reviewed:  I have personally reviewed following labs and imaging studies  Micro Results No results found for this or any previous visit (from the past 240 hour(s)).  Radiology Reports Dg Chest 2 View  10/26/2015  CLINICAL DATA:  Chest pain.  Fever and short of breath.  COPD EXAM: CHEST  2 VIEW COMPARISON:  08/21/2015 FINDINGS: COPD with pulmonary hyperinflation. Negative for infiltrate or effusion. Negative for mass lesion. Mild scarring in the left lung base is stable. Heart size is normal.  No heart failure.  Right coronary stent. Chronic fracture deformity left clavicle IMPRESSION: COPD without acute cardiopulmonary abnormality. Electronically Signed   By: Franchot Gallo M.D.   On: 10/26/2015 13:46    CBC  Recent Labs Lab 10/26/15 1551 10/26/15 2330 10/27/15 0453  WBC 4.6 4.8 2.4*  HGB 14.8 13.9 13.9  HCT 45.3 42.4 42.9  PLT 337 313 322  MCV 94.6 93.8 93.9  MCH 30.9 30.8 30.4  MCHC 32.7 32.8 32.4  RDW 14.0 14.1 14.1    Chemistries   Recent Labs Lab 10/26/15 1551 10/26/15 2330 10/27/15 0453  NA 139  --  139  K 4.2  --  4.6  CL 101  --  101  CO2 27  --  28  GLUCOSE 126*  --  178*  BUN 6  --  16  CREATININE 0.96 1.05* 1.13*  CALCIUM 8.9  --  9.1    ------------------------------------------------------------------------------------------------------------------ estimated creatinine clearance is 45.4 mL/min (by C-G formula based on Cr of 1.13). ------------------------------------------------------------------------------------------------------------------ No results for input(s): HGBA1C in the last 72 hours. ------------------------------------------------------------------------------------------------------------------ No results for input(s): CHOL, HDL, LDLCALC, TRIG, CHOLHDL,  LDLDIRECT in the last 72 hours. ------------------------------------------------------------------------------------------------------------------ No results for input(s): TSH, T4TOTAL, T3FREE, THYROIDAB in the last 72 hours.  Invalid input(s): FREET3 ------------------------------------------------------------------------------------------------------------------ No results for input(s): VITAMINB12, FOLATE, FERRITIN, TIBC, IRON, RETICCTPCT in the last 72 hours.  Coagulation profile  Recent Labs Lab 10/26/15 1551  INR 0.93    No results for input(s): DDIMER in the last 72 hours.  Cardiac Enzymes No results for input(s): CKMB, TROPONINI, MYOGLOBIN in the last 168 hours.  Invalid input(s): CK ------------------------------------------------------------------------------------------------------------------ Invalid input(s): POCBNP  No results for input(s): GLUCAP in the last 72 hours.   RAI,RIPUDEEP M.D. [facility] 10/27/2015, 12:36 PM  Pager: [phone] Between 7am to 7pm - call Pager - [phone]  After 7pm go to [email or web address] - password [email or web address]  Call night coverage person covering after 7pm

## 2015-10-28 DIAGNOSIS — E039 Hypothyroidism, unspecified: Secondary | ICD-10-CM

## 2015-10-28 LAB — BASIC METABOLIC PANEL
Anion gap: 8 (ref 5–15)
BUN: 21 mg/dL — ABNORMAL HIGH (ref 6–20)
CALCIUM: 8.9 mg/dL (ref 8.9–10.3)
CHLORIDE: 103 mmol/L (ref 101–111)
CO2: 28 mmol/L (ref 22–32)
CREATININE: 1 mg/dL (ref 0.44–1.00)
GFR calc Af Amer: >60 mL/min (ref >60)
GFR calc non Af Amer: >60 mL/min (ref >60)
GLUCOSE: 124 mg/dL — AB (ref 65–99)
Potassium: 4.4 mmol/L (ref 3.5–5.1)
Sodium: 139 mmol/L (ref 135–145)

## 2015-10-28 LAB — CBC
HCT: 41.6 % (ref 36.0–46.0)
Hemoglobin: 13.3 g/dL (ref 12.0–15.0)
MCH: 30 pg (ref 26.0–34.0)
MCHC: 32 g/dL (ref 30.0–36.0)
MCV: 93.9 fL (ref 78.0–100.0)
PLATELETS: 320 10*3/uL (ref 150–400)
RBC: 4.43 MIL/uL (ref 3.87–5.11)
RDW: 14.1 % (ref 11.5–15.5)
WBC: 8.4 10*3/uL (ref 4.0–10.5)

## 2015-10-28 MED ORDER — METHYLPREDNISOLONE SODIUM SUCC 125 MG IJ SOLR
60.0000 mg | Freq: Four times a day (QID) | INTRAMUSCULAR | Status: DC
  Administered 2015-10-28 – 2015-10-30 (×8): 60 mg via INTRAVENOUS
  Filled 2015-10-28 (×8): qty 2

## 2015-10-28 MED ORDER — IPRATROPIUM-ALBUTEROL 0.5-2.5 (3) MG/3ML IN SOLN
3.0000 mL | RESPIRATORY_TRACT | Status: DC
  Administered 2015-10-28 – 2015-10-29 (×6): 3 mL via RESPIRATORY_TRACT
  Filled 2015-10-28 (×5): qty 3

## 2015-10-28 NOTE — Progress Notes (Signed)
[facility]                                                                              Patient Demographics  [name] [name], is a 60 y.o. female, DOB - [birth year], [id number]  Admit date - 10/26/2015   Admitting Physician Edwin Dada, MD  Outpatient Primary MD for the patient is Angelica Chessman, MD  LOS -   days    Chief Complaint  Patient presents with  . 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 The patient developed a bronchitis 2 weeks ago, was treated with prednisone and Z-Pak to resolution. Per patient, she was visiting her grandmother in the hospital several times who was admitted with pneumonia. In ED, patient was tachycardiac with low-grade temp  CXR showed no pneumonia.Patient was admitted for further workup.    Assessment & Plan     COPD emphysema and asthma, with flare: With ongoing nicotine abuse - Patient actively wheezing, placed on IV Solu-Medrol, increase to a nebs to every 4 hours, - Recently treated with Zithromax, prednisone. - continue home Advair, montelukast, nasal steroid and PPI    HTN and CAD secondary prevention:  No chest pain. -Continue statin, furosemide, prasugrel  Hypothyroidism:  -Continue home levothyroxine  Chronic pain and anxiety:  -Continue home clonazepam and cyclobenzaprine PRN -Tramadol PRN -Continue gabapentin  Nicotine abuse Cessation was recommended. -Smoking cessation nursing counseling -Nicotine patch while inpatient   Code Status: full code  Family Communication: Discussed in detail with the patient, all imaging results, lab results explained to the patient    Disposition Plan:   Time Spent  in minutes  15 minutes  Procedures  None   Consults   None   DVT Prophylaxis  Lovenox   Medications  Scheduled Meds: . atorvastatin  40 mg Oral q1800  . doxycycline  100 mg Oral Q12H  . enoxaparin (LOVENOX) injection  40 mg Subcutaneous Q24H  . escitalopram  10 mg Oral Daily  . fluticasone  2 spray Each Nare QHS  . furosemide  20 mg Oral Daily  . gabapentin  600 mg Oral QID  . guaiFENesin  600 mg Oral BID  . ipratropium-albuterol  3 mL Nebulization Q4H  . labetalol  100 mg Oral BID  . levothyroxine  100 mcg Oral QAC breakfast  . losartan  50 mg Oral Daily  . methylPREDNISolone (SOLU-MEDROL) injection  60 mg Intravenous Q6H  . mometasone-formoterol  2 puff Inhalation BID  . montelukast  10 mg Oral QHS  . nicotine  14 mg Transdermal Daily  . pantoprazole  40 mg Oral Daily  . prasugrel  10 mg Oral Daily   Continuous Infusions:  PRN Meds:.acetaminophen **OR** acetaminophen, clonazePAM, cyclobenzaprine, ondansetron **OR** ondansetron (ZOFRAN) IV, senna-docusate   Antibiotics   Anti-infectives    Start     Dose/Rate Route Frequency Ordered Stop   10/26/15  2330  doxycycline (VIBRA-TABS) tablet 100 mg     100 mg Oral Every 12 hours 10/26/15 2303          Subjective:   [name] [name] was seen and examined today.Feeling worse today, wheezing actively.  Patient denies dizziness, chest pain,abdominal pain, N/V/D/C, new weakness, numbess, tingling. No acute events overnight.    Objective:   Filed Vitals:   10/27/15 2238 10/28/15 0556 10/28/15 0813 10/28/15 0912  BP: 117/56 106/62 125/61   Pulse: 103 60 62   Temp: 97.8 F (36.6 C) 98.2 F (36.8 C)    TempSrc: Oral     Resp: 18 18    Height:      Weight:      SpO2: 93% 93%  93%    Intake/Output Summary (Last 24 hours) at 10/28/15 1236 Last data filed at 10/28/15 0658  Gross per 24 hour  Intake    720 ml  Output      0 ml  Net    720 ml     Wt Readings from Last 3 Encounters:  10/27/15 62.279 kg (137 lb 4.8  oz)  09/11/15 65.137 kg (143 lb 9.6 oz)  06/22/15 65.953 kg (145 lb 6.4 oz)     Exam  General: Alert and oriented x 3, NAD  HEENT:    Neck:   CVS: S1 S2 auscultated, no rubs, murmurs or gallops. Regular rate and rhythm.  Respiratory: Diffuse expiratory wheezing bilaterally  Abdomen: Soft, nontender, nondistended, + bowel sounds  Ext: no cyanosis clubbing or edema  Neuro: no new deficits  Skin: No rashes  Psych: Normal affect and demeanor, alert and oriented x3    Data Reviewed:  I have personally reviewed following labs and imaging studies  Micro Results No results found for this or any previous visit (from the past 240 hour(s)).  Radiology Reports Dg Chest 2 View  10/26/2015  CLINICAL DATA:  Chest pain.  Fever and short of breath.  COPD EXAM: CHEST  2 VIEW COMPARISON:  08/21/2015 FINDINGS: COPD with pulmonary hyperinflation. Negative for infiltrate or effusion. Negative for mass lesion. Mild scarring in the left lung base is stable. Heart size is normal.  No heart failure.  Right coronary stent. Chronic fracture deformity left clavicle IMPRESSION: COPD without acute cardiopulmonary abnormality. Electronically Signed   By: Franchot Gallo M.D.   On: 10/26/2015 13:46    CBC  Recent Labs Lab 10/26/15 1551 10/26/15 2330 10/27/15 0453 10/28/15 0520  WBC 4.6 4.8 2.4* 8.4  HGB 14.8 13.9 13.9 13.3  HCT 45.3 42.4 42.9 41.6  PLT 337 313 322 320  MCV 94.6 93.8 93.9 93.9  MCH 30.9 30.8 30.4 30.0  MCHC 32.7 32.8 32.4 32.0  RDW 14.0 14.1 14.1 14.1    Chemistries   Recent Labs Lab 10/26/15 1551 10/26/15 2330 10/27/15 0453 10/28/15 0520  NA 139  --  139 139  K 4.2  --  4.6 4.4  CL 101  --  101 103  CO2 27  --  28 28  GLUCOSE 126*  --  178* 124*  BUN 6  --  16 21*  CREATININE 0.96 1.05* 1.13* 1.00  CALCIUM 8.9  --  9.1 8.9   ------------------------------------------------------------------------------------------------------------------ estimated creatinine  clearance is 51.3 mL/min (by C-G formula based on Cr of 1). ------------------------------------------------------------------------------------------------------------------ No results for input(s): HGBA1C in the last 72 hours. ------------------------------------------------------------------------------------------------------------------ No results for input(s): CHOL, HDL, LDLCALC, TRIG, CHOLHDL, LDLDIRECT in the last 72 hours. ------------------------------------------------------------------------------------------------------------------ No results for input(s):  TSH, T4TOTAL, T3FREE, THYROIDAB in the last 72 hours.  Invalid input(s): FREET3 ------------------------------------------------------------------------------------------------------------------ No results for input(s): VITAMINB12, FOLATE, FERRITIN, TIBC, IRON, RETICCTPCT in the last 72 hours.  Coagulation profile  Recent Labs Lab 10/26/15 1551  INR 0.93    No results for input(s): DDIMER in the last 72 hours.  Cardiac Enzymes No results for input(s): CKMB, TROPONINI, MYOGLOBIN in the last 168 hours.  Invalid input(s): CK ------------------------------------------------------------------------------------------------------------------ Invalid input(s): POCBNP  No results for input(s): GLUCAP in the last 72 hours.   [name] [name] M.D. [facility] 10/28/2015, 12:36 PM  Pager: [phone] Between 7am to 7pm - call Pager - [phone]  After 7pm go to [email or web address] - password [email or web address]  Call night coverage person covering after 7pm

## 2015-10-29 LAB — BASIC METABOLIC PANEL
ANION GAP: 12 (ref 5–15)
BUN: 22 mg/dL — ABNORMAL HIGH (ref 6–20)
CHLORIDE: 103 mmol/L (ref 101–111)
CO2: 23 mmol/L (ref 22–32)
Calcium: 9.3 mg/dL (ref 8.9–10.3)
Creatinine, Ser: 0.87 mg/dL (ref 0.44–1.00)
GLUCOSE: 182 mg/dL — AB (ref 65–99)
Potassium: 4.8 mmol/L (ref 3.5–5.1)
Sodium: 138 mmol/L (ref 135–145)

## 2015-10-29 LAB — CBC
HEMATOCRIT: 40.3 % (ref 36.0–46.0)
Hemoglobin: 13.2 g/dL (ref 12.0–15.0)
MCH: 30.3 pg (ref 26.0–34.0)
MCHC: 32.8 g/dL (ref 30.0–36.0)
MCV: 92.6 fL (ref 78.0–100.0)
PLATELETS: 317 10*3/uL (ref 150–400)
RBC: 4.35 MIL/uL (ref 3.87–5.11)
RDW: 14.3 % (ref 11.5–15.5)
WBC: 7.1 10*3/uL (ref 4.0–10.5)

## 2015-10-29 MED ORDER — MENTHOL 3 MG MT LOZG: 1.0000 | LOZENGE | OROMUCOSAL | Status: DC | PRN

## 2015-10-29 MED ORDER — BENZONATATE 100 MG PO CAPS
100.0000 mg | ORAL_CAPSULE | Freq: Three times a day (TID) | ORAL | Status: DC
  Administered 2015-10-29 – 2015-10-30 (×4): 100 mg via ORAL
  Filled 2015-10-29 (×4): qty 1

## 2015-10-29 MED ORDER — ZOLPIDEM TARTRATE 5 MG PO TABS: 5.0000 mg | ORAL_TABLET | Freq: Every evening | ORAL | Status: DC | PRN

## 2015-10-29 MED ORDER — HYDROCOD POLST-CPM POLST ER 10-8 MG/5ML PO SUER
5.0000 mL | Freq: Two times a day (BID) | ORAL | Status: DC
  Administered 2015-10-29 – 2015-10-30 (×3): 5 mL via ORAL
  Filled 2015-10-29 (×3): qty 5

## 2015-10-29 MED ORDER — IPRATROPIUM-ALBUTEROL 0.5-2.5 (3) MG/3ML IN SOLN
3.0000 mL | Freq: Three times a day (TID) | RESPIRATORY_TRACT | Status: DC
Start: 2015-10-29 — End: 2015-10-30
  Administered 2015-10-29 – 2015-10-30 (×2): 3 mL via RESPIRATORY_TRACT
  Filled 2015-10-29 (×2): qty 3

## 2015-10-29 NOTE — Progress Notes (Signed)
[facility]                                                                              Patient Demographics  [name] [name], is a 60 y.o. female, DOB - [birth year], [id number]  Admit date - 10/26/2015   Admitting Physician Edwin Dada, MD  Outpatient Primary MD for the patient is Angelica Chessman, MD  LOS -   days    Chief Complaint  Patient presents with  . 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 The patient developed a bronchitis 2 weeks ago, was treated with prednisone and Z-Pak to resolution. Per patient, she was visiting her grandmother in the hospital several times who was admitted with pneumonia. In ED, patient was tachycardiac with low-grade temp  CXR showed no pneumonia.Patient was admitted for further workup.    Assessment & Plan     COPD emphysema and asthma, with flare: With ongoing nicotine abuse - Improving today, continue IV Solu-Medrol, scheduled nebs - Recently treated with Zithromax, prednisone. - continue home Advair, montelukast, nasal steroid and PPI    HTN and CAD secondary prevention:  No chest pain. -Continue statin, furosemide, prasugrel  Hypothyroidism:  -Continue home levothyroxine  Chronic pain and anxiety:  -Continue home clonazepam and cyclobenzaprine PRN -Tramadol PRN -Continue gabapentin  Nicotine abuse Cessation was recommended. -Smoking cessation nursing counseling -Nicotine patch while inpatient   Code Status: full code  Family Communication: Discussed in detail with the patient, all imaging results, lab results explained to the patient    Disposition Plan: hopefully dc in am  Time Spent in minutes  15  minutes  Procedures  None   Consults   None   DVT Prophylaxis  Lovenox   Medications  Scheduled Meds: . atorvastatin  40 mg Oral q1800  . benzonatate  100 mg Oral TID  . chlorpheniramine-HYDROcodone  5 mL Oral Q12H  . doxycycline  100 mg Oral Q12H  . enoxaparin (LOVENOX) injection  40 mg Subcutaneous Q24H  . escitalopram  10 mg Oral Daily  . fluticasone  2 spray Each Nare QHS  . furosemide  20 mg Oral Daily  . gabapentin  600 mg Oral QID  . guaiFENesin  600 mg Oral BID  . ipratropium-albuterol  3 mL Nebulization TID  . labetalol  100 mg Oral BID  . levothyroxine  100 mcg Oral QAC breakfast  . losartan  50 mg Oral Daily  . methylPREDNISolone (SOLU-MEDROL) injection  60 mg Intravenous Q6H  . mometasone-formoterol  2 puff Inhalation BID  . montelukast  10 mg Oral QHS  . nicotine  14 mg Transdermal Daily  . pantoprazole  40 mg Oral Daily  . prasugrel  10 mg Oral Daily   Continuous Infusions:  PRN Meds:.acetaminophen **OR** acetaminophen, clonazePAM, cyclobenzaprine, menthol-cetylpyridinium, ondansetron **OR** ondansetron (ZOFRAN) IV, senna-docusate, zolpidem   Antibiotics   Anti-infectives  Start     Dose/Rate Route Frequency Ordered Stop   10/26/15 2330  doxycycline (VIBRA-TABS) tablet 100 mg     100 mg Oral Every 12 hours 10/26/15 2303          Subjective:   Isamar Lopezhernandez was seen and examined today. Improving today.  Patient denies dizziness, chest pain,abdominal pain, N/V/D/C, new weakness, numbess, tingling. No acute events overnight.  Wheezing improving   Objective:   Filed Vitals:   10/28/15 2026 10/29/15 0411 10/29/15 0811 10/29/15 0900  BP: 124/58 133/67  127/71  Pulse: 77 73 76 71  Temp: 98 F (36.7 C) 98.8 F (37.1 C)    TempSrc:  Oral    Resp: 20 20 20    Height:      Weight:      SpO2: 91% 93% 96%     Intake/Output Summary (Last 24 hours) at 10/29/15 1201 Last data filed at 10/29/15 0100  Gross per 24 hour  Intake    300 ml  Output       0 ml  Net    300 ml     Wt Readings from Last 3 Encounters:  10/27/15 62.279 kg (137 lb 4.8 oz)  09/11/15 65.137 kg (143 lb 9.6 oz)  06/22/15 65.953 kg (145 lb 6.4 oz)     Exam  General: Alert and oriented x 3, NAD  HEENT:    Neck:   CVS: S1 S2 clear, RRR  Respiratory: Wheezing improving  Abdomen: Soft, nontender, nondistended, + bowel sounds  Ext: no cyanosis clubbing or edema  Neuro: no new deficits  Skin: No rashes  Psych: Normal affect and demeanor, alert and oriented x3    Data Reviewed:  I have personally reviewed following labs and imaging studies  Micro Results No results found for this or any previous visit (from the past 240 hour(s)).  Radiology Reports Dg Chest 2 View  10/26/2015  CLINICAL DATA:  Chest pain.  Fever and short of breath.  COPD EXAM: CHEST  2 VIEW COMPARISON:  08/21/2015 FINDINGS: COPD with pulmonary hyperinflation. Negative for infiltrate or effusion. Negative for mass lesion. Mild scarring in the left lung base is stable. Heart size is normal.  No heart failure.  Right coronary stent. Chronic fracture deformity left clavicle IMPRESSION: COPD without acute cardiopulmonary abnormality. Electronically Signed   By: Franchot Gallo M.D.   On: 10/26/2015 13:46    CBC  Recent Labs Lab 10/26/15 1551 10/26/15 2330 10/27/15 0453 10/28/15 0520 10/29/15 0525  WBC 4.6 4.8 2.4* 8.4 7.1  HGB 14.8 13.9 13.9 13.3 13.2  HCT 45.3 42.4 42.9 41.6 40.3  PLT 337 313 322 320 317  MCV 94.6 93.8 93.9 93.9 92.6  MCH 30.9 30.8 30.4 30.0 30.3  MCHC 32.7 32.8 32.4 32.0 32.8  RDW 14.0 14.1 14.1 14.1 14.3    Chemistries   Recent Labs Lab 10/26/15 1551 10/26/15 2330 10/27/15 0453 10/28/15 0520 10/29/15 0525  NA 139  --  139 139 138  K 4.2  --  4.6 4.4 4.8  CL 101  --  101 103 103  CO2 27  --  28 28 23   GLUCOSE 126*  --  178* 124* 182*  BUN 6  --  16 21* 22*  CREATININE 0.96 1.05* 1.13* 1.00 0.87  CALCIUM 8.9  --  9.1 8.9 9.3    ------------------------------------------------------------------------------------------------------------------ estimated creatinine clearance is 58.9 mL/min (by C-G formula based on Cr of 0.87). ------------------------------------------------------------------------------------------------------------------ No results for input(s): HGBA1C in the last  72 hours. ------------------------------------------------------------------------------------------------------------------ No results for input(s): CHOL, HDL, LDLCALC, TRIG, CHOLHDL, LDLDIRECT in the last 72 hours. ------------------------------------------------------------------------------------------------------------------ No results for input(s): TSH, T4TOTAL, T3FREE, THYROIDAB in the last 72 hours.  Invalid input(s): FREET3 ------------------------------------------------------------------------------------------------------------------ No results for input(s): VITAMINB12, FOLATE, FERRITIN, TIBC, IRON, RETICCTPCT in the last 72 hours.  Coagulation profile  Recent Labs Lab 10/26/15 1551  INR 0.93    No results for input(s): DDIMER in the last 72 hours.  Cardiac Enzymes No results for input(s): CKMB, TROPONINI, MYOGLOBIN in the last 168 hours.  Invalid input(s): CK ------------------------------------------------------------------------------------------------------------------ Invalid input(s): POCBNP  No results for input(s): GLUCAP in the last 72 hours.   RAI,RIPUDEEP M.D. [facility] 10/29/2015, 12:01 PM  Pager: [phone] Between 7am to 7pm - call Pager - [phone]  After 7pm go to [email or web address] - password [email or web address]  Call night coverage person covering after 7pm

## 2015-10-30 LAB — BASIC METABOLIC PANEL
Anion gap: 9 (ref 5–15)
BUN: 19 mg/dL (ref 6–20)
CHLORIDE: 102 mmol/L (ref 101–111)
CO2: 29 mmol/L (ref 22–32)
CREATININE: 0.84 mg/dL (ref 0.44–1.00)
Calcium: 9.2 mg/dL (ref 8.9–10.3)
GFR calc Af Amer: >60 mL/min (ref >60)
GFR calc non Af Amer: >60 mL/min (ref >60)
Glucose, Bld: 167 mg/dL — ABNORMAL HIGH (ref 65–99)
POTASSIUM: 4.7 mmol/L (ref 3.5–5.1)
Sodium: 140 mmol/L (ref 135–145)

## 2015-10-30 LAB — CBC
HCT: 41.9 % (ref 36.0–46.0)
HEMOGLOBIN: 13.5 g/dL (ref 12.0–15.0)
MCH: 30.2 pg (ref 26.0–34.0)
MCHC: 32.2 g/dL (ref 30.0–36.0)
MCV: 93.7 fL (ref 78.0–100.0)
PLATELETS: 289 10*3/uL (ref 150–400)
RBC: 4.47 MIL/uL (ref 3.87–5.11)
RDW: 14.1 % (ref 11.5–15.5)
WBC: 11.5 10*3/uL — ABNORMAL HIGH (ref 4.0–10.5)

## 2015-10-30 MED ORDER — BENZONATATE 100 MG PO CAPS: 100.0000 mg | ORAL_CAPSULE | Freq: Three times a day (TID) | ORAL | Status: DC | PRN

## 2015-10-30 MED ORDER — MONTELUKAST SODIUM 10 MG PO TABS: ORAL_TABLET | ORAL | Status: DC

## 2015-10-30 MED ORDER — IPRATROPIUM-ALBUTEROL 0.5-2.5 (3) MG/3ML IN SOLN: 3.0000 mL | Freq: Three times a day (TID) | RESPIRATORY_TRACT | Status: DC

## 2015-10-30 MED ORDER — DOXYCYCLINE HYCLATE 100 MG PO TABS: 100.0000 mg | ORAL_TABLET | Freq: Two times a day (BID) | ORAL | Status: DC

## 2015-10-30 MED ORDER — IPRATROPIUM-ALBUTEROL 0.5-2.5 (3) MG/3ML IN SOLN: 3.0000 mL | Freq: Two times a day (BID) | RESPIRATORY_TRACT | Status: DC

## 2015-10-30 MED ORDER — PREDNISONE 10 MG PO TABS: ORAL_TABLET | ORAL | Status: DC

## 2015-10-30 MED ORDER — HYDROCOD POLST-CPM POLST ER 10-8 MG/5ML PO SUER: 5.0000 mL | Freq: Two times a day (BID) | ORAL | Status: DC | PRN

## 2015-10-30 NOTE — Progress Notes (Signed)
[name] [name] to be D/C'd to home per MD order.  Discussed with the patient and all questions fully answered.  VSS, Skin clean, dry and intact without evidence of skin break down, no evidence of skin tears noted. IV catheter discontinued intact. Site without signs and symptoms of complications. Dressing and pressure applied.  An After Visit Summary was printed and given to the patient. Patient received prescriptions.  D/c education completed with patient/family including follow up instructions, medication list, d/c activities limitations if indicated, with other d/c instructions as indicated by MD - patient able to verbalize understanding, all questions fully answered.   Patient instructed to return to ED, call 911, or call MD for any changes in condition.   Patient escorted via [place], and D/C home via private auto.  [name] Price 10/30/2015 11:21 AM

## 2015-10-30 NOTE — Discharge Summary (Signed)
Physician Discharge Summary   Patient ID: [name] [name] MRN: [id number] DOB/AGE: 19-Nov-1955 60 y.o.  Admit date: 10/26/2015 Discharge date: 10/30/2015  Primary Care Physician:  Angelica Chessman, MD  Discharge Diagnoses:   . COPD exacerbation ([place]) . TOBACCO ABUSE . Hypothyroidism . GERD (gastroesophageal reflux disease) . Essential hypertension, benign . Emphysema lung ([place]) . Chronic back pain  Consults: None  Recommendations for Outpatient Follow-up:  1. Please repeat CBC/BMET at next visit   DIET heart healthy diet    Allergies:   Allergies  Allergen Reactions  . Other Anaphylaxis    avacdos  . Codeine Nausea Only  . Latex     REACTION: shortness of breath and rash     DISCHARGE MEDICATIONS: Current Discharge Medication List    START taking these medications   Details  benzonatate (TESSALON) 100 MG capsule Take 1 capsule (100 mg total) by mouth 3 (three) times daily as needed for cough. Qty: 30 capsule, Refills: 0    chlorpheniramine-HYDROcodone (TUSSIONEX) 10-8 MG/5ML SUER Take 5 mLs by mouth every 12 (twelve) hours as needed for cough. Qty: 115 mL, Refills: 0    doxycycline (VIBRA-TABS) 100 MG tablet Take 1 tablet (100 mg total) by mouth 2 (two) times daily. X 7 DAYS Qty: 14 tablet, Refills: 0    ipratropium-albuterol (DUONEB) 0.5-2.5 (3) MG/3ML SOLN Take 3 mLs by nebulization 3 (three) times daily. X 3DAYS, then change to every 4hours as needed for wheezing or shortness of breath Qty: 360 mL, Refills: 3    predniSONE (DELTASONE) 10 MG tablet Prednisone dosing: Take  Prednisone 40mg  (4 tabs) x 3 days, then taper to 30mg  (3 tabs) x 3 days, then 20mg  (2 tabs) x 3days, then 10mg  (1 tab) x 3days, then OFF.  Dispense:  30 tabs, refills: None Qty: 30 tablet, Refills: 0      CONTINUE these medications which have CHANGED   Details  montelukast (SINGULAIR) 10 MG tablet TAKE 1 TABLET BY MOUTH AT BEDTIME. Qty: 90 tablet, Refills: 3   Associated Diagnoses:  Asthma, chronic, moderate persistent, with acute exacerbation      CONTINUE these medications which have NOT CHANGED   Details  albuterol (VENTOLIN HFA) 108 (90 BASE) MCG/ACT inhaler Inhale 1 puff into the lungs every 6 (six) hours as needed for wheezing or shortness of breath. Qty: 8 g, Refills: 3   Associated Diagnoses: Asthma, chronic, moderate persistent, with acute exacerbation    atorvastatin (LIPITOR) 40 MG tablet TAKE 1 TABLET BY MOUTH ONCE DAILY Qty: 90 tablet, Refills: 0    Calcium 600 MG tablet Take 600 mg by mouth daily.  Qty: 60 tablet    clobetasol cream (TEMOVATE) AB-123456789 % Apply 1 application topically 2 (two) times daily. Qty: 30 g, Refills: 0   Associated Diagnoses: Psoriasis    clonazePAM (KLONOPIN) 0.5 MG tablet TAKE ONE TABLET BY MOUTH AT BEDTIME AS NEEDED FOR ANXIETY Qty: 30 tablet, Refills: 0    cyclobenzaprine (FLEXERIL) 10 MG tablet Take 1 tablet (10 mg total) by mouth 3 (three) times daily as needed for muscle spasms. Qty: 90 tablet, Refills: 3   Associated Diagnoses: Chronic back pain    escitalopram (LEXAPRO) 10 MG tablet Take 1 tablet (10 mg total) by mouth daily. Qty: 30 tablet, Refills: 3   Associated Diagnoses: Moderate episode of recurrent major depressive disorder (HCC)    fluticasone (FLONASE) 50 MCG/ACT nasal spray Place 2 sprays into both nostrils at bedtime. Qty: 16 g, Refills: 3   Associated Diagnoses: Asthma,  chronic, moderate persistent, with acute exacerbation    Fluticasone-Salmeterol (ADVAIR DISKUS) 100-50 MCG/DOSE AEPB Inhale 1 puff into the lungs 2 (two) times daily. Qty: 180 each, Refills: 3   Associated Diagnoses: Asthma, chronic, moderate persistent, with acute exacerbation    furosemide (LASIX) 20 MG tablet TAKE 1 TABLET BY MOUTH ONCE DAILY Qty: 90 tablet, Refills: 0    gabapentin (NEURONTIN) 600 MG tablet Take 1 tablet (600 mg total) by mouth 4 (four) times daily. Qty: 120 tablet, Refills: 0   Associated Diagnoses: Chronic  back pain    labetalol (NORMODYNE) 100 MG tablet TAKE 1 TABLET BY MOUTH 2 TIMES DAILY. Qty: 180 tablet, Refills: 0    levothyroxine (SYNTHROID, LEVOTHROID) 100 MCG tablet TAKE 1 TABLET BY MOUTH DAILY Qty: 90 tablet, Refills: 0    losartan (COZAAR) 50 MG tablet TAKE 1 TABLET BY MOUTH ONCE DAILY Qty: 90 tablet, Refills: 0    omeprazole (PRILOSEC) 20 MG capsule TAKE 2 CAPSULES BY MOUTH DAILY Qty: 180 capsule, Refills: 0    prasugrel (EFFIENT) 10 MG TABS tablet Take 1 tablet (10 mg total) by mouth daily. Qty: 30 tablet    traMADol (ULTRAM) 50 MG tablet Take 1 tablet (50 mg total) by mouth every 12 (twelve) hours as needed (cough). 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 The patient developed a bronchitis 2 weeks ago, was treated with prednisone and Z-Pak to resolution. Per patient, she was visiting her grandmother in the hospital several times who was admitted with pneumonia. In ED, patient was tachycardiac with low-grade temp CXR showed no pneumonia.Patient was admitted for further workup.  Hospital Course:   COPD emphysema and asthma, with flare: With ongoing nicotine abuse -Improving, patient was transitioned to oral prednisone with taper. She was placed on scheduled bronchodilators.  - Recently treated with Zithromax, prednisone. Patient was placed on doxycycline for one week. - continue home Advair, montelukast, nasal steroid and PPI - I called pulmonology office, made an  appointment for follow-up on 4/24. She used to follow Dr. Baird Lyons   HTN and CAD secondary prevention:  No chest pain. -Continue statin, furosemide, prasugrel  Hypothyroidism:  -Continue home levothyroxine  Chronic pain and anxiety:  -Continue home clonazepam and cyclobenzaprine PRN -Tramadol PRN -Continue gabapentin  Nicotine abuse Cessation was recommended. -Nicotine patch was placed while inpatient   Day of Discharge BP 128/64 mmHg  Pulse 59  Temp(Src) 98 F (36.7 C) (Oral)  Resp 16  Ht 5\' 1"  (1.549 m)  Wt 62.279 kg (137 lb 4.8 oz)  BMI 25.96 kg/m2  SpO2 93%  Physical Exam: General: Alert and awake oriented x3 not in any acute distress. HEENT: anicteric sclera, pupils reactive to light and accommodation CVS: S1-S2 clear no murmur rubs or gallops Chest: clear to auscultation bilaterally, no wheezing rales or rhonchi Abdomen: soft nontender, nondistended, normal bowel sounds Extremities: no cyanosis, clubbing or edema noted bilaterally Neuro: Cranial nerves II-XII intact, no focal neurological deficits   The results  of significant diagnostics from this hospitalization (including imaging, microbiology, ancillary and laboratory) are listed below for reference.    LAB RESULTS: Basic Metabolic Panel:  Recent Labs Lab 10/29/15 0525 10/30/15 0542  NA 138 140  K 4.8 4.7  CL 103 102  CO2 23 29  GLUCOSE 182* 167*  BUN 22* 19  CREATININE 0.87 0.84  CALCIUM 9.3 9.2   Liver Function Tests: No results for input(s): AST, ALT, ALKPHOS, BILITOT, PROT, ALBUMIN in the last 168 hours. No results for input(s): LIPASE, AMYLASE in the last 168 hours. No results for input(s): AMMONIA in the last 168 hours. CBC:  Recent Labs Lab 10/29/15 0525 10/30/15 0542  WBC 7.1 11.5*  HGB 13.2 13.5  HCT 40.3 41.9  MCV 92.6 93.7  PLT 317 289   Cardiac Enzymes: No results for input(s): CKTOTAL, CKMB, CKMBINDEX, TROPONINI in the last 168 hours. BNP: Invalid input(s):  POCBNP CBG: No results for input(s): GLUCAP in the last 168 hours.  Significant Diagnostic Studies:  Dg Chest 2 View  10/26/2015  CLINICAL DATA:  Chest pain.  Fever and short of breath.  COPD EXAM: CHEST  2 VIEW COMPARISON:  08/21/2015 FINDINGS: COPD with pulmonary hyperinflation. Negative for infiltrate or effusion. Negative for mass lesion. Mild scarring in the left lung base is stable. Heart size is normal.  No heart failure.  Right coronary stent. Chronic fracture deformity left clavicle IMPRESSION: COPD without acute cardiopulmonary abnormality. Electronically Signed   By: Franchot Gallo M.D.   On: 10/26/2015 13:46    2D ECHO:   Disposition and Follow-up: Discharge Instructions    Diet - low sodium heart healthy    Complete by:  As directed      Discharge instructions    Complete by:  As directed   PLEASE CONTINUE ALBUTEROL/ATROVENT NEBULIZER TREATMENT 3 TIMES A DAY FOR NEXT 3 DAYS, THEN CHANGE TO EVERY 4 HOURS AS NEEDED     Increase activity slowly    Complete by:  As directed             DISPOSITIONHome   DISCHARGE FOLLOW-UP Follow-up Information    Follow up with [facility]. Schedule an appointment as soon as possible for a visit in 2 weeks.   Why:  for hospital follow-up// Office was closed fro holliday call in the morning   Contact information:   [address] [place] [phone]      Follow up with Angelica Chessman, MD. Schedule an appointment as soon as possible for a visit in 2 weeks.   Specialty:  Internal Medicine   Why:  for hospital follow-up Doctor office is closed call in the morning for an appointment    Contact information:   [place] [address] [phone]       Follow up with Noe Gens, NP On 11/06/2015.   Specialty:  Pulmonary Disease   Why:  for hospital follow-up at 10 AM. please arrive 41mins early for paperwork.     Contact information:   [place] [address] [phone]        Time spent on Discharge: 25 minutes  Signed:   RAI,RIPUDEEP M.D. [facility] 10/30/2015, 11:26 AM Pager: [phone]

## 2015-11-06 ENCOUNTER — Ambulatory Visit (INDEPENDENT_AMBULATORY_CARE_PROVIDER_SITE_OTHER): Payer: No Typology Code available for payment source | Admitting: Pulmonary Disease

## 2015-11-06 ENCOUNTER — Encounter: Payer: Self-pay | Admitting: Pulmonary Disease

## 2015-11-06 VITALS — BP 148/86 | HR 73 | Temp 99.0°F | Ht 61.0 in | Wt 140.0 lb

## 2015-11-06 DIAGNOSIS — J441 Chronic obstructive pulmonary disease with (acute) exacerbation: Secondary | ICD-10-CM

## 2015-11-06 MED ORDER — NICOTINE 14 MG/24HR TD PT24: MEDICATED_PATCH | TRANSDERMAL | Status: DC

## 2015-11-06 MED ORDER — NICOTINE 21 MG/24HR TD PT24
21.0000 mg | MEDICATED_PATCH | Freq: Every day | TRANSDERMAL | Status: DC
Start: 2015-11-06 — End: 2017-03-13

## 2015-11-06 NOTE — Patient Instructions (Addendum)
1.  Congratulations on your efforts to quit smoking!!   You can do it!  2.  Use the nicotine patch as follows - 21 mg patch for 7 days, then 14mg  patch for 7 days, then cut remainder of 14mg  patches in half and use to completion.  Cut back on your cigarettes while you are using the patches.    3.  Finish up using your [place] (once in am and once in pm) until gone. After you finish your Dulera, resume Advair (once in am and once in pm).    4.  Complete your antibiotics and prednisone as prescribed  5.  Use your albuterol as needed for rescue inhaler  6. Pacer activity to allow for shortness of breath  7.  Follow up with your primary MD as previously instructed  8.  Follow up with Dr. Annamaria Boots in 4 months

## 2015-11-06 NOTE — Progress Notes (Signed)
Current Outpatient Prescriptions on File Prior to Visit  Medication Sig  . albuterol (VENTOLIN HFA) 108 (90 BASE) MCG/ACT inhaler Inhale 1 puff into the lungs every 6 (six) hours as needed for wheezing or shortness of breath.  Marland Kitchen atorvastatin (LIPITOR) 40 MG tablet TAKE 1 TABLET BY MOUTH ONCE DAILY  . benzonatate (TESSALON) 100 MG capsule Take 1 capsule (100 mg total) by mouth 3 (three) times daily as needed for cough.  . Calcium 600 MG tablet Take 600 mg by mouth daily.   . chlorpheniramine-HYDROcodone (TUSSIONEX) 10-8 MG/5ML SUER Take 5 mLs by mouth every 12 (twelve) hours as needed for cough.  . clobetasol cream (TEMOVATE) [id number] % Apply 1 application topically 2 (two) times daily.  . clonazePAM (KLONOPIN) 0.5 MG tablet TAKE ONE TABLET BY MOUTH AT BEDTIME AS NEEDED FOR ANXIETY  . cyclobenzaprine (FLEXERIL) 10 MG tablet Take 1 tablet (10 mg total) by mouth 3 (three) times daily as needed for muscle spasms.  Marland Kitchen doxycycline (VIBRA-TABS) 100 MG tablet Take 1 tablet (100 mg total) by mouth 2 (two) times daily. X 7 DAYS  . escitalopram (LEXAPRO) 10 MG tablet Take 1 tablet (10 mg total) by mouth daily.  . fluticasone (FLONASE) 50 MCG/ACT nasal spray Place 2 sprays into both nostrils at bedtime.  . Fluticasone-Salmeterol (ADVAIR DISKUS) 100-50 MCG/DOSE AEPB Inhale 1 puff into the lungs 2 (two) times daily.  . furosemide (LASIX) 20 MG tablet TAKE 1 TABLET BY MOUTH ONCE DAILY  . gabapentin (NEURONTIN) 600 MG tablet Take 1 tablet (600 mg total) by mouth 4 (four) times daily.  Marland Kitchen ipratropium-albuterol (DUONEB) 0.5-2.5 (3) MG/3ML SOLN Take 3 mLs by nebulization 3 (three) times daily. X 3DAYS, then change to every 4hours as needed for wheezing or shortness of breath  . labetalol (NORMODYNE) 100 MG tablet TAKE 1 TABLET BY MOUTH 2 TIMES DAILY.  Marland Kitchen levothyroxine (SYNTHROID, LEVOTHROID) 100 MCG tablet TAKE 1 TABLET BY MOUTH DAILY  . losartan (COZAAR) 50 MG tablet TAKE 1 TABLET BY MOUTH ONCE DAILY  . montelukast  (SINGULAIR) 10 MG tablet TAKE 1 TABLET BY MOUTH AT BEDTIME.  Marland Kitchen omeprazole (PRILOSEC) 20 MG capsule TAKE 2 CAPSULES BY MOUTH DAILY  . prasugrel (EFFIENT) 10 MG TABS tablet Take 1 tablet (10 mg total) by mouth daily.  . traMADol (ULTRAM) 50 MG tablet Take 1 tablet (50 mg total) by mouth every 12 (twelve) hours as needed (cough).  . predniSONE (DELTASONE) 10 MG tablet Prednisone dosing: Take  Prednisone 40mg  (4 tabs) x 3 days, then taper to 30mg  (3 tabs) x 3 days, then 20mg  (2 tabs) x 3days, then 10mg  (1 tab) x 3days, then OFF.  Dispense:  30 tabs, refills: None (Patient not taking: Reported on 11/06/2015)   No current facility-administered medications on file prior to visit.     Chief Complaint  Patient presents with  . Hospitalization Follow-up    Pt is here for a hospital follow up. Pt c/o chest congestion, prod cough with white, sinus drainage. Denies any sinus congestion, chest tightness, fever, nausea or vomiting.      Tests:  Past medical hx  has a past medical history of Thyroid disease; Coronary artery disease; Hernia; Chronic back pain; Hyperlipidemia; Hypertension; Heart attack ([place]) (2011); Asthma; COPD (chronic obstructive pulmonary disease) ([place]); Shortness of breath; Bronchitis; Arthritis; Joint pain; Neck pain; Bruises easily; Psoriasis; H/O hiatal hernia; GERD (gastroesophageal reflux disease); Hemorrhoids; Diverticulosis; Slowing of urinary stream; Hypothyroidism; Depression; Anxiety; Insomnia; and Bartholin gland cyst (08/29/2011).   Past surgical  hx, Allergies, Family hx, Social hx all reviewed.  Vital Signs BP 148/86 mmHg  Pulse 73  Temp(Src) 99 F (37.2 C) (Oral)  Ht 5\' 1"  (1.549 m)  Wt 140 lb (63.504 kg)  BMI 26.47 kg/m2  SpO2 96%.  NOTE PATIENT WAS DRINKING COFFEE PRIOR TO VITALS.    History of Present Illness [name] [name] is a 60 y.o. female, current smoker, with a PMH of HTN, HLD, MI s/p PCI, GERD, hypothyroidism, depression/anxiety, asthma, COPD (followed  by Dr. Annamaria Boots) and recent admission from 4/13-4/17 for an acute asthma exacerbation. During hospitalization she was treated with antibiotics, nebulized dilators, and steroids.  The patient returns for/24 for post hospital pulmonary follow-up.  She reports she currently is on Prednisone taper on 2 tabs (20 mg).  She also is completing doxycycline (after review of medication, she indicates she was only taking 1 per day rather than twice a day). Patient reports she feels a little congested this morning but overall better since hospital discharge. Her cough has significantly improved with minimal white sputum production.  Breathing overall better.  She reports she has cut down on smoking from 1ppd to 2 cigarettes per day. She notes that she felt like she was going to die when she went to the hospital and this scared her in regards to smoking.  Other home pulmonary medications reviewed and she indicates she has been taking Advair once per day and when necessary Ventolin.  She is followed by Dr. Doreene Burke at [facility].   Physical Exam  General - well developed adult in no acute distress ENT - No sinus tenderness, no oral exudate, no LAN Cardiac - s1s2 regular, no murmur Chest - even/non-labored, lungs bilaterally clear. No wheeze/rales Back - No focal tenderness Abd - Soft, non-tender Ext - No edema Neuro - Normal strength Skin - No rashes Psych - normal mood, and behavior   Assessment/Plan  1.  COPD - with recent exacerbation, ongoing tobacco abuse  2.  Tobacco abuse  Plan: Smoking cessation encouraged. Patient provided "quit now" information.   Prescription for nicotine patches with taper given Discussed continuing to cut down on cigarettes while using nicotine patches Reviewed Advair and albuterol use with patient Continue Singulair daily Encouraged 'pacing' of activities with shortness of breath Discussed COPD red flags - increased sputum production, increased cough,  increasing shortness of breath, and change in sputum color   Patient Instructions  1.  Congratulations on your efforts to quit smoking!!   You can do it!  2.  Use the nicotine patch as follows - 21 mg patch for 7 days, then 14mg  patch for 7 days, then cut remainder of 14mg  patches in half and use to completion.  Cut back on your cigarettes while you are using the patches.    3.  Finish up using your [place] (once in am and once in pm) until gone. After you finish your Dulera, resume Advair (once in am and once in pm).    4.  Complete your antibiotics and prednisone as prescribed  5.  Use your albuterol as needed for rescue inhaler  6. Pacer activity to allow for shortness of breath  7.  Follow up with your primary MD as previously instructed  8.  Follow up with Dr. Annamaria Boots in 4 months      Noe Gens, NP-[facility] [place]  [phone] 11/06/2015, 1:47 PM

## 2015-11-24 ENCOUNTER — Telehealth: Payer: Self-pay | Admitting: Internal Medicine

## 2015-11-24 NOTE — Telephone Encounter (Signed)
Pt. Called requesting a refill on clonazePAM (KLONOPIN) 0.5 MG tablet. Pt. Is out of medication. Please f/u with pt.

## 2015-11-28 ENCOUNTER — Other Ambulatory Visit: Payer: Self-pay | Admitting: *Deleted

## 2015-11-28 NOTE — Telephone Encounter (Signed)
Patient last OV in February.

## 2015-11-29 MED ORDER — CLONAZEPAM 0.5 MG PO TABS: 0.5000 mg | ORAL_TABLET | Freq: Every day | ORAL | Status: DC

## 2015-11-29 MED FILL — FUROSEMIDE 20 MG TABLET: 20 | 30 days supply | Qty: 30 | Fill #3

## 2015-11-29 MED FILL — ?LEVOTHYROXINE 100 MCG TAB: 100 | 30 days supply | Qty: 30 | Fill #3

## 2015-11-29 MED FILL — ?ATORVASTATIN 40MG TABLET: 40 | 30 days supply | Qty: 30 | Fill #3

## 2015-11-29 MED FILL — OMEPRAZOLE DR 20 MG CAPSULE: 20 | 30 days supply | Qty: 60 | Fill #1

## 2015-11-29 MED FILL — ?ESCITALOPRAM 10 MG TABLET: 10 | 30 days supply | Qty: 30 | Fill #3

## 2015-11-29 MED FILL — MONTELUKAST SOD 10 MG TAB: 10 | 30 days supply | Qty: 30 | Fill #6

## 2015-11-30 NOTE — Telephone Encounter (Signed)
Patient verified DOB Patient is aware of prescription being placed at the front desk for pickup. No further questions at this time.

## 2015-12-18 MED FILL — LABETALOL HCL 100 MG TABLET: 100 | 30 days supply | Qty: 60 | Fill #2

## 2015-12-21 MED FILL — $ADVAIR 100/50 MCG INHALER: 100-50 | 30 days supply | Qty: 60 | Fill #3

## 2016-01-01 ENCOUNTER — Other Ambulatory Visit: Payer: Self-pay | Admitting: Family Medicine

## 2016-01-01 MED FILL — ESCITALOPRAM 10 MG TABLET: 10 | 30 days supply | Qty: 30 | Fill #0

## 2016-01-01 MED FILL — FUROSEMIDE 20 MG TABLET: 20 | 30 days supply | Qty: 30 | Fill #4

## 2016-01-01 MED FILL — ?LEVOTHYROXINE 100 MCG TAB: 100 | 30 days supply | Qty: 30 | Fill #4

## 2016-01-01 MED FILL — CYCLOBENZAPRINE 10 MG TAB: 10 | 30 days supply | Qty: 90 | Fill #2

## 2016-01-01 MED FILL — ATORVASTATIN 40 MG TABLET: 40 | 30 days supply | Qty: 30 | Fill #4

## 2016-01-01 MED FILL — ?MONTELUKAST SOD 10 MG TAB: 10 | 30 days supply | Qty: 30 | Fill #7

## 2016-01-05 ENCOUNTER — Telehealth: Payer: Self-pay | Admitting: Internal Medicine

## 2016-01-05 NOTE — Telephone Encounter (Signed)
Patient came in requesting Klonopin and Tramadol. Please follow up.

## 2016-01-09 ENCOUNTER — Other Ambulatory Visit: Payer: Self-pay | Admitting: *Deleted

## 2016-01-09 DIAGNOSIS — G8929 Other chronic pain: Secondary | ICD-10-CM

## 2016-01-09 DIAGNOSIS — M549 Dorsalgia, unspecified: Principal | ICD-10-CM

## 2016-01-09 MED ORDER — CLONAZEPAM 0.5 MG PO TABS: 0.5000 mg | ORAL_TABLET | Freq: Every day | ORAL | Status: DC

## 2016-01-09 MED ORDER — TRAMADOL HCL 50 MG PO TABS: 50.0000 mg | ORAL_TABLET | Freq: Two times a day (BID) | ORAL | Status: DC | PRN

## 2016-01-09 NOTE — Telephone Encounter (Signed)
Patient was last seen 09/11/15. Patient is requesting a refill on Tramadol and Klonopin.

## 2016-01-09 NOTE — Telephone Encounter (Signed)
Routed to PCP for approval or denial 

## 2016-01-11 ENCOUNTER — Ambulatory Visit: Payer: Self-pay

## 2016-01-17 ENCOUNTER — Ambulatory Visit: Payer: Self-pay

## 2016-01-23 MED FILL — FUROSEMIDE 20 MG TABLET: 20 | 30 days supply | Qty: 30 | Fill #5

## 2016-01-23 MED FILL — ESCITALOPRAM 10 MG TABLET: 10 | 30 days supply | Qty: 30 | Fill #1

## 2016-01-23 MED FILL — MONTELUKAST SOD 10 MG TAB: 10 | 30 days supply | Qty: 30 | Fill #8

## 2016-01-23 MED FILL — VENTOLIN HFA 90 MCG INHALER: 108 (90 BAS | 25 days supply | Qty: 18 | Fill #3

## 2016-01-23 MED FILL — ?LEVOTHYROXINE 100 MCG TAB: 100 | 30 days supply | Qty: 30 | Fill #5

## 2016-01-23 MED FILL — ATORVASTATIN 40 MG TABLET: 40 | 30 days supply | Qty: 30 | Fill #5

## 2016-01-23 MED FILL — CYCLOBENZAPRINE 10 MG TAB: 10 | 30 days supply | Qty: 90 | Fill #3

## 2016-01-24 MED FILL — $ADVAIR 100/50 MCG INHALER: 100-50 | 30 days supply | Qty: 60 | Fill #4

## 2016-01-25 MED FILL — LABETALOL HCL 100 MG TABLET: 100 | 30 days supply | Qty: 60 | Fill #3

## 2016-01-26 ENCOUNTER — Other Ambulatory Visit: Payer: Self-pay | Admitting: Internal Medicine

## 2016-01-26 MED FILL — OMEPRAZOLE DR 20 MG CAPSULE: 20 | 30 days supply | Qty: 60 | Fill #2

## 2016-01-26 MED FILL — LOSARTAN POTASSIUM 50 MG TA: 50 | 30 days supply | Qty: 30 | Fill #0

## 2016-02-20 ENCOUNTER — Telehealth: Payer: Self-pay | Admitting: Pharmacist

## 2016-02-20 NOTE — Telephone Encounter (Signed)
Informed pt [facility] here for pick up. Medication left at front desk.

## 2016-02-21 ENCOUNTER — Encounter (HOSPITAL_COMMUNITY): Payer: Self-pay | Admitting: Emergency Medicine

## 2016-02-21 ENCOUNTER — Ambulatory Visit (HOSPITAL_COMMUNITY)
Admission: EM | Admit: 2016-02-21 | Discharge: 2016-02-21 | Disposition: A | Discharge status: Home / Self Care | Payer: Self-pay | Attending: Family Medicine | Admitting: Family Medicine

## 2016-02-21 ENCOUNTER — Ambulatory Visit (INDEPENDENT_AMBULATORY_CARE_PROVIDER_SITE_OTHER): Payer: Self-pay

## 2016-02-21 DIAGNOSIS — J441 Chronic obstructive pulmonary disease with (acute) exacerbation: Secondary | ICD-10-CM

## 2016-02-21 MED ORDER — IPRATROPIUM-ALBUTEROL 0.5-2.5 (3) MG/3ML IN SOLN
3.0000 mL | Freq: Once | RESPIRATORY_TRACT | Status: AC
  Administered 2016-02-21: 3 mL via RESPIRATORY_TRACT

## 2016-02-21 MED ORDER — PREDNISONE 20 MG PO TABS: 20.0000 mg | ORAL_TABLET | Freq: Every day | ORAL | 0 refills | Status: DC

## 2016-02-21 MED ORDER — LEVOFLOXACIN 750 MG PO TABS: 750.0000 mg | ORAL_TABLET | Freq: Every day | ORAL | 0 refills | Status: DC

## 2016-02-21 MED ORDER — IPRATROPIUM-ALBUTEROL 0.5-2.5 (3) MG/3ML IN SOLN
RESPIRATORY_TRACT | Status: AC
  Filled 2016-02-21: qty 3

## 2016-02-21 NOTE — ED Provider Notes (Signed)
CSN: [id number]     Arrival date & time 02/21/16  1256 History   First MD Initiated Contact with Patient 02/21/16 1323     Chief Complaint  Patient presents with  . Shortness of Breath   (Consider location/radiation/quality/duration/timing/severity/associated sxs/prior Treatment) Mrs. Garbisch is a 60 y.o female with history of COPD, MI, asthma, HTN, hyperlipidemia, hypertension, CAD, and history of 2 stent placements, presents today for shortness of breath. She was recently admitted to the hospital for COPD exacerbation with emphysema. She have had this shortness of breath for 4 days. She states that she normally stays short winded but not out of breath. She also endorses fatigue, been laying around more, been coughing more than usual, have nasal congestion, and chest tightness but not chest pain. She has been using her nebulizers and her albuterol inhaler at home. She last used her nebulizer this morning.       Past Medical History:  Diagnosis Date  . Anxiety    takes Lexapro daily  . Arthritis   . Asthma   . Bartholin gland cyst 08/29/2011  . Bronchitis    couple of months ago  . Bruises easily    pt is on Effient  . Chronic back pain    herniated nucleus pulposus  . COPD (chronic obstructive pulmonary disease) ([place])    early stages  . Coronary artery disease   . Depression    takes Klonopin daily  . Diverticulosis   . GERD (gastroesophageal reflux disease)    takes Nexium daily  . H/O hiatal hernia   . Heart attack ([place]) 2011  . Hemorrhoids   . Hernia   . Hyperlipidemia    takes Lipitor daily  . Hypertension    takes Losartan daily and Labetalol bid  . Hypothyroidism    takes Synthroid daily  . Insomnia    hydroxyzine prn  . Joint pain   . Neck pain   . Psoriasis    elbows,knees,back  . Shortness of breath    with exertion  . Slowing of urinary stream   . Thyroid disease    Past Surgical History:  Procedure Laterality Date  . ABDOMINAL HYSTERECTOMY  1977  .  COLONOSCOPY    . CORONARY ANGIOPLASTY WITH STENT PLACEMENT  2011   x 2  . esophagogastroduodenscoy    . LAPAROSCOPY  1977   exploratory  . LUMBAR LAMINECTOMY/DECOMPRESSION MICRODISCECTOMY  08/05/2011   Procedure: LUMBAR LAMINECTOMY/DECOMPRESSION MICRODISCECTOMY;  Surgeon: Otilio Connors, MD;  Location: [facility];  Service: Neurosurgery;  Laterality: Right;  Right Lumbar four-five extraforaminal discectomy  . TONSILLECTOMY     as a child   Family History  Problem Relation Age of Onset  . Heart disease Mother   . Hypertension Mother   . Stroke Mother   . Heart disease Father   . Hypertension Father   . Diabetes Father   . Cancer Maternal Aunt     breast  . Thyroid disease Paternal Aunt   . Heart disease Maternal Grandfather   . Anesthesia problems Daughter   . Cancer Paternal Grandmother     mouth  . Hypotension Neg Hx   . Malignant hyperthermia Neg Hx   . Pseudochol deficiency Neg Hx    Social History  Substance Use Topics  . Smoking status: Current Every Day Smoker    Packs/day: 1.00    Years: 44.00    Types: Cigarettes  . Smokeless tobacco: Never Used  . Alcohol use No  Comment: alcohol last night   OB History    Gravida Para Term Preterm AB Living   1 1 1  0 0 1   SAB TAB Ectopic Multiple Live Births   0 0 0 0       Review of Systems  Constitutional: Positive for fatigue. Negative for chills and fever.  HENT: Positive for congestion and rhinorrhea. Negative for ear pain, sneezing and sore throat.   Respiratory: Positive for cough, chest tightness, shortness of breath and wheezing.   Cardiovascular: Negative for chest pain, palpitations and leg swelling.  Gastrointestinal: Negative.   Neurological: Negative for dizziness, weakness, numbness and headaches.    Allergies  Other; Codeine; and Latex  Home Medications   Prior to Admission medications   Medication Sig Start Date End Date Taking? Authorizing Provider  albuterol (VENTOLIN HFA) 108 (90 BASE)  MCG/ACT inhaler Inhale 1 puff into the lungs every 6 (six) hours as needed for wheezing or shortness of breath. 05/18/15  Yes Josalyn Funches, MD  atorvastatin (LIPITOR) 40 MG tablet TAKE 1 TABLET BY MOUTH ONCE DAILY 08/15/15  Yes Tresa Garter, MD  cyclobenzaprine (FLEXERIL) 10 MG tablet Take 1 tablet (10 mg total) by mouth 3 (three) times daily as needed for muscle spasms. 06/22/15  Yes Tresa Garter, MD  escitalopram (LEXAPRO) 10 MG tablet TAKE 1 TABLET BY MOUTH DAILY. 01/01/16  Yes Tresa Garter, MD  fluticasone (FLONASE) 50 MCG/ACT nasal spray Place 2 sprays into both nostrils at bedtime. 05/18/15  Yes Josalyn Funches, MD  Fluticasone-Salmeterol (ADVAIR DISKUS) 100-50 MCG/DOSE AEPB Inhale 1 puff into the lungs 2 (two) times daily. 07/05/15  Yes Tresa Garter, MD  furosemide (LASIX) 20 MG tablet TAKE 1 TABLET BY MOUTH ONCE DAILY 08/15/15  Yes Olugbemiga E Doreene Burke, MD  ipratropium-albuterol (DUONEB) 0.5-2.5 (3) MG/3ML SOLN Take 3 mLs by nebulization 3 (three) times daily. X 3DAYS, then change to every 4hours as needed for wheezing or shortness of breath 10/30/15  Yes Ripudeep K Rai, MD  labetalol (NORMODYNE) 100 MG tablet TAKE 1 TABLET BY MOUTH 2 TIMES DAILY. 08/21/15  Yes Tresa Garter, MD  levothyroxine (SYNTHROID, LEVOTHROID) 100 MCG tablet TAKE 1 TABLET BY MOUTH DAILY 08/21/15  Yes Tresa Garter, MD  losartan (COZAAR) 50 MG tablet Take 1 tablet (50 mg total) by mouth daily. Needs office visit for refills 01/26/16  Yes Olugbemiga E Doreene Burke, MD  montelukast (SINGULAIR) 10 MG tablet TAKE 1 TABLET BY MOUTH AT BEDTIME. 10/30/15  Yes Ripudeep K Rai, MD  nicotine (NICODERM CQ - DOSED IN MG/24 HOURS) 14 mg/24hr patch Use full patch for 7 days, then cut patch in half & use remainder, then stop. 11/06/15  Yes Donita Brooks, NP  omeprazole (PRILOSEC) 20 MG capsule TAKE 2 CAPSULES BY MOUTH DAILY 09/22/15  Yes Tresa Garter, MD  prasugrel (EFFIENT) 10 MG TABS tablet Take 1 tablet  (10 mg total) by mouth daily. 07/14/13  Yes Patrecia Pour, NP  traMADol (ULTRAM) 50 MG tablet Take 1 tablet (50 mg total) by mouth every 12 (twelve) hours as needed (cough). 01/09/16  Yes Tresa Garter, MD  benzonatate (TESSALON) 100 MG capsule Take 1 capsule (100 mg total) by mouth 3 (three) times daily as needed for cough. 10/30/15   Ripudeep Krystal Eaton, MD  Calcium 600 MG tablet Take 600 mg by mouth daily.  07/14/13   Patrecia Pour, NP  chlorpheniramine-HYDROcodone (TUSSIONEX) 10-8 MG/5ML SUER Take 5 mLs by mouth every 12 (  twelve) hours as needed for cough. 10/30/15   Ripudeep Krystal Eaton, MD  clobetasol cream (TEMOVATE) [id number] % Apply 1 application topically 2 (two) times daily. 06/22/15   Tresa Garter, MD  clonazePAM (KLONOPIN) 0.5 MG tablet Take 1 tablet (0.5 mg total) by mouth at bedtime. 01/09/16   Tresa Garter, MD  doxycycline (VIBRA-TABS) 100 MG tablet Take 1 tablet (100 mg total) by mouth 2 (two) times daily. X 7 DAYS 10/30/15   Ripudeep Krystal Eaton, MD  gabapentin (NEURONTIN) 600 MG tablet Take 1 tablet (600 mg total) by mouth 4 (four) times daily. 07/14/13   Patrecia Pour, NP  nicotine (NICODERM CQ - DOSED IN MG/24 HOURS) 21 mg/24hr patch Place 1 patch (21 mg total) onto the skin daily. 11/06/15   Donita Brooks, NP  predniSONE (DELTASONE) 10 MG tablet Prednisone dosing: Take  Prednisone 40mg  (4 tabs) x 3 days, then taper to 30mg  (3 tabs) x 3 days, then 20mg  (2 tabs) x 3days, then 10mg  (1 tab) x 3days, then OFF.  Dispense:  30 tabs, refills: None Patient not taking: Reported on 11/06/2015 10/30/15   Ripudeep Krystal Eaton, MD   Meds Ordered and Administered this Visit   Medications  ipratropium-albuterol (DUONEB) 0.5-2.5 (3) MG/3ML nebulizer solution 3 mL (3 mLs Nebulization Given 02/21/16 1342)    BP 130/88 (BP Location: Right Arm)   Pulse 75   Temp 98.3 F (36.8 C) (Oral)   Resp 20   SpO2 96%  No data found.   Physical Exam  Constitutional: She is oriented to person, place, and time.  She appears well-developed and well-nourished.  Cardiovascular: Normal rate, regular rhythm, normal heart sounds and intact distal pulses.   Pulmonary/Chest: Effort normal. No accessory muscle usage. No respiratory distress. She has wheezes in the right upper field, the right lower field, the left upper field and the left lower field.  Neurological: She is alert and oriented to person, place, and time.  Skin: Skin is warm and dry. No pallor.    Urgent Care Course   Clinical Course   13:53: DuoNeb treatment finished; patient reports feeling better and able to breath better. Reports the chest is not as tight. Wheezing still present but greatly improved. Chest xray still pending  14:00: Patient reports that she is indeed breathing better; 2nd round of neb was offered but patient declined.   Procedures (including critical care time)  Labs Review Labs Reviewed - No data to display  Imaging Review Dg Chest 2 View  Result Date: 02/21/2016 CLINICAL DATA:  Increasing shortness of breath and wheezing over the last 4 days, history of asthma, smoking history EXAM: CHEST  2 VIEW COMPARISON:  Chest x-ray of 10/26/2015 FINDINGS: The lungs are clear but somewhat hyperaerated consistent with emphysema. Bibasilar linear atelectasis and/or scarring remains. Mediastinal and hilar contours are unremarkable. The heart is within normal limits in size. Coronary artery stents are present. An old healed fracture of the mid left clavicle is noted. IMPRESSION: 1. Hyperaeration consistent with emphysema. No active infiltrate or effusion. 2. Bibasilar linear atelectasis and/or scarring. Electronically Signed   By: Ivar Drape M.D.   On: 02/21/2016 14:04     MDM  No diagnosis found.  Mrs. Kutter is a 60 Y.O female with COPD presents today for shortness of breath x 4 days. She also endorses fatigue, coughing more than usual, nasal congestion and chest tightness. Physical exam revealed wheezes in many lung fields. She  was treated with DuoNeb x 1  with significant improvement and her wheezes improved after the nebulizer treatment. Her chest xray showed hyperaeration consistent with emphysema. No active infiltrate or effusion. Patient's oxygen saturation remains stable at 96%. She doesn't appears to be in acute distress. Patient discharged home in stable condition with prednisone and Levaquin (she said z-pack don't work well for her). Patient instructed to follow up with her PCP or return if she does not improve. Return precaution discussed.    Barry Dienes, NP 02/21/16 1425

## 2016-02-21 NOTE — ED Triage Notes (Signed)
The patient presented to the [facility] with a complaint of shortness of breath and congestion x 5 days.

## 2016-02-27 ENCOUNTER — Other Ambulatory Visit: Payer: Self-pay | Admitting: Internal Medicine

## 2016-02-27 ENCOUNTER — Other Ambulatory Visit: Payer: Self-pay | Admitting: Family Medicine

## 2016-02-27 DIAGNOSIS — J4541 Moderate persistent asthma with (acute) exacerbation: Secondary | ICD-10-CM

## 2016-02-27 MED FILL — ?LEVOTHYROXINE 100 MCG TAB: 100 | 30 days supply | Qty: 30 | Fill #0

## 2016-02-27 MED FILL — LABETALOL HCL 100 MG TABLET: 100 | 30 days supply | Qty: 60 | Fill #4

## 2016-02-27 MED FILL — ?FUROSEMIDE 20 MG TABLET: 20 | 30 days supply | Qty: 30 | Fill #0

## 2016-02-27 MED FILL — ?ATORVASTATIN 40MG TABLET: 40 | 30 days supply | Qty: 30 | Fill #0

## 2016-02-27 MED FILL — VENTOLIN HFA 90 MCG INHALER: 108 (90 BAS | 30 days supply | Qty: 18 | Fill #0

## 2016-02-27 MED FILL — LOSARTAN POTASSIUM 50 MG TA: 50 | 30 days supply | Qty: 30 | Fill #0

## 2016-02-27 NOTE — Telephone Encounter (Signed)
Pt. Called requesting a refill on the following medications:  clonazePAM (KLONOPIN) 0.5 MG tablet   traMADol (ULTRAM) 50 MG tablet    Please f/u

## 2016-03-06 MED FILL — ESCITALOPRAM 10 MG TABLET: 10 | 30 days supply | Qty: 30 | Fill #2

## 2016-03-06 MED FILL — ?MONTELUKAST SOD 10 MG TAB: 10 | 30 days supply | Qty: 30 | Fill #9

## 2016-03-06 MED FILL — $ADVAIR 100/50 MCG INHALER: 100-50 | 30 days supply | Qty: 60 | Fill #5

## 2016-03-07 ENCOUNTER — Other Ambulatory Visit: Payer: Self-pay | Admitting: Internal Medicine

## 2016-03-07 NOTE — Telephone Encounter (Signed)
Pt came into the office to pick up prescription. According to patient she stated that she called last week to request refill but there are not notes in her patient file. Pt needs refill for traMADol (ULTRAM) 50 MG tablet and  clonazePAM (KLONOPIN) 0.5 MG tablet. Please follow up.

## 2016-03-08 NOTE — Telephone Encounter (Signed)
[place]? . Dr Doreene Burke pt . thx

## 2016-03-12 NOTE — Telephone Encounter (Signed)
I do not prescribe Klonopin due to their addicting nature.  She needs to make appt w/ [place] for the pain though. Has not been seen in clinic for 53months and needs pain contract if going to continue getting ultrams here.  Would recd [facility] or [facility] services for eval of klonopin. thx

## 2016-03-29 ENCOUNTER — Other Ambulatory Visit: Payer: Self-pay | Admitting: *Deleted

## 2016-03-29 DIAGNOSIS — J4541 Moderate persistent asthma with (acute) exacerbation: Secondary | ICD-10-CM

## 2016-03-29 MED ORDER — ALBUTEROL SULFATE HFA 108 (90 BASE) MCG/ACT IN AERS: INHALATION_SPRAY | RESPIRATORY_TRACT | 3 refills | Status: DC

## 2016-03-29 NOTE — Telephone Encounter (Signed)
Patient called the office to follow up on her medication refill regarding Tramadol and Klonopin. I checked the medication log and didn't find prescriptions. Please follow up.   Thank you.

## 2016-04-01 NOTE — Telephone Encounter (Signed)
Patient is requesting a refill on Klonopin and Tramadol. Covering provider refused due to possible addiction with continuous use of both. PCP will review upon his return to the office.

## 2016-04-02 ENCOUNTER — Ambulatory Visit: Payer: Self-pay | Attending: Internal Medicine

## 2016-04-04 ENCOUNTER — Ambulatory Visit: Payer: Self-pay | Admitting: Internal Medicine

## 2016-04-04 NOTE — Telephone Encounter (Signed)
Pt calling again requesting a refill on Klonopin and Tramadol  Pt was scheduled an appointment today with PCP but is unable to make it due to no transportation  Pt is upset stating she has been trying to get a refill on medications for a few months

## 2016-04-05 ENCOUNTER — Other Ambulatory Visit: Payer: Self-pay | Admitting: Internal Medicine

## 2016-04-05 MED FILL — **ADVAIR 100/50 DISKUS: 100-50 MCG | 7 days supply | Qty: 14 | Fill #6

## 2016-04-05 MED FILL — MONTELUKAST SOD 10 MG TAB: 10 | 30 days supply | Qty: 30 | Fill #10

## 2016-04-05 MED FILL — VENTOLIN HFA 90 MCG INHALER: 108 (90 BAS | 30 days supply | Qty: 18 | Fill #1

## 2016-04-05 MED FILL — ESCITALOPRAM 10 MG TABLET: 10 | 30 days supply | Qty: 30 | Fill #3

## 2016-04-05 MED FILL — ?OMEPRAZOLE DR 20 MG CAPSUL: 20 | 30 days supply | Qty: 60 | Fill #3

## 2016-04-05 MED FILL — LABETALOL HCL 100 MG TABLET: 100 | 30 days supply | Qty: 60 | Fill #5

## 2016-04-05 NOTE — Telephone Encounter (Signed)
Person took a message for patient to return a phone call to [facility] at [phone].  !!!Please inform patient of no longer being able to receive both medications from [facility]. Patient needs to pick which medication she would like the PCP to prescribe. If tramadol patient will need to obtain klonopin from psychiatry. If patient would like PCP to prescribe Klonopin then patient will need to obtain Tramadol from pain clinic!!!

## 2016-04-08 ENCOUNTER — Other Ambulatory Visit: Payer: Self-pay | Admitting: Internal Medicine

## 2016-04-08 DIAGNOSIS — G8929 Other chronic pain: Secondary | ICD-10-CM

## 2016-04-08 DIAGNOSIS — M549 Dorsalgia, unspecified: Principal | ICD-10-CM

## 2016-04-08 MED ORDER — LEVOTHYROXINE SODIUM 100 MCG PO TABS: 100.0000 ug | ORAL_TABLET | Freq: Every day | ORAL | 0 refills | Status: DC

## 2016-04-08 MED ORDER — CYCLOBENZAPRINE HCL 10 MG PO TABS: 10.0000 mg | ORAL_TABLET | Freq: Three times a day (TID) | ORAL | 0 refills | Status: DC | PRN

## 2016-04-08 MED ORDER — LOSARTAN POTASSIUM 50 MG PO TABS: 50.0000 mg | ORAL_TABLET | Freq: Every day | ORAL | 0 refills | Status: DC

## 2016-04-08 MED ORDER — ATORVASTATIN CALCIUM 40 MG PO TABS: 40.0000 mg | ORAL_TABLET | Freq: Every day | ORAL | 0 refills | Status: DC

## 2016-04-08 MED ORDER — FUROSEMIDE 20 MG PO TABS: 20.0000 mg | ORAL_TABLET | Freq: Every day | ORAL | 0 refills | Status: DC

## 2016-04-08 MED FILL — CYCLOBENZAPRINE 10 MG TAB: 10 | 15 days supply | Qty: 45 | Fill #0

## 2016-04-08 MED FILL — ATORVASTATIN 40 MG TABLET: 40 | 30 days supply | Qty: 30 | Fill #0

## 2016-04-08 MED FILL — FUROSEMIDE 20 MG TABLET: 20 | 30 days supply | Qty: 30 | Fill #0

## 2016-04-08 MED FILL — LEVOTHYROXINE 100 MCG TAB: 100 | 30 days supply | Qty: 30 | Fill #0

## 2016-04-08 MED FILL — LOSARTAN POTASSIUM 50 MG TA: 50 | 30 days supply | Qty: 30 | Fill #0

## 2016-04-08 NOTE — Progress Notes (Signed)
We need fu appt w/  Her, have not seen her in clinic since 2/17. I gave her 1 month supply of meds on renewals (atorvastatin, flexeril, furosemide, levothyroxine, losartan). thanks

## 2016-04-09 NOTE — Progress Notes (Signed)
Pt has an appointment schedule with Dr. Janne Napoleon on 04/16/16 and Dr. Janne Napoleon sent in rx for a months supply

## 2016-04-16 ENCOUNTER — Encounter: Payer: Self-pay | Admitting: Internal Medicine

## 2016-04-16 ENCOUNTER — Ambulatory Visit: Payer: Self-pay | Attending: Internal Medicine | Admitting: Internal Medicine

## 2016-04-16 VITALS — BP 152/109 | HR 71 | Temp 98.1°F | Resp 16 | Wt 144.2 lb

## 2016-04-16 DIAGNOSIS — J439 Emphysema, unspecified: Secondary | ICD-10-CM

## 2016-04-16 DIAGNOSIS — Z1231 Encounter for screening mammogram for malignant neoplasm of breast: Secondary | ICD-10-CM

## 2016-04-16 DIAGNOSIS — K219 Gastro-esophageal reflux disease without esophagitis: Secondary | ICD-10-CM | POA: Insufficient documentation

## 2016-04-16 DIAGNOSIS — Z760 Encounter for issue of repeat prescription: Secondary | ICD-10-CM | POA: Insufficient documentation

## 2016-04-16 DIAGNOSIS — Z9114 Patient's other noncompliance with medication regimen: Secondary | ICD-10-CM

## 2016-04-16 DIAGNOSIS — J449 Chronic obstructive pulmonary disease, unspecified: Secondary | ICD-10-CM | POA: Insufficient documentation

## 2016-04-16 DIAGNOSIS — Z1239 Encounter for other screening for malignant neoplasm of breast: Secondary | ICD-10-CM

## 2016-04-16 DIAGNOSIS — Z9111 Patient's noncompliance with dietary regimen: Secondary | ICD-10-CM | POA: Insufficient documentation

## 2016-04-16 DIAGNOSIS — I2510 Atherosclerotic heart disease of native coronary artery without angina pectoris: Secondary | ICD-10-CM

## 2016-04-16 DIAGNOSIS — Z1211 Encounter for screening for malignant neoplasm of colon: Secondary | ICD-10-CM

## 2016-04-16 DIAGNOSIS — Z955 Presence of coronary angioplasty implant and graft: Secondary | ICD-10-CM | POA: Insufficient documentation

## 2016-04-16 DIAGNOSIS — E2839 Other primary ovarian failure: Secondary | ICD-10-CM

## 2016-04-16 DIAGNOSIS — G8929 Other chronic pain: Secondary | ICD-10-CM | POA: Insufficient documentation

## 2016-04-16 DIAGNOSIS — Z9861 Coronary angioplasty status: Secondary | ICD-10-CM

## 2016-04-16 DIAGNOSIS — F329 Major depressive disorder, single episode, unspecified: Secondary | ICD-10-CM | POA: Insufficient documentation

## 2016-04-16 DIAGNOSIS — E785 Hyperlipidemia, unspecified: Secondary | ICD-10-CM | POA: Insufficient documentation

## 2016-04-16 DIAGNOSIS — F17210 Nicotine dependence, cigarettes, uncomplicated: Secondary | ICD-10-CM | POA: Insufficient documentation

## 2016-04-16 DIAGNOSIS — Z79899 Other long term (current) drug therapy: Secondary | ICD-10-CM | POA: Insufficient documentation

## 2016-04-16 DIAGNOSIS — E039 Hypothyroidism, unspecified: Secondary | ICD-10-CM

## 2016-04-16 DIAGNOSIS — F410 Panic disorder [episodic paroxysmal anxiety] without agoraphobia: Secondary | ICD-10-CM | POA: Insufficient documentation

## 2016-04-16 DIAGNOSIS — M545 Low back pain: Secondary | ICD-10-CM | POA: Insufficient documentation

## 2016-04-16 DIAGNOSIS — I10 Essential (primary) hypertension: Secondary | ICD-10-CM

## 2016-04-16 DIAGNOSIS — G4700 Insomnia, unspecified: Secondary | ICD-10-CM

## 2016-04-16 DIAGNOSIS — Z23 Encounter for immunization: Secondary | ICD-10-CM

## 2016-04-16 DIAGNOSIS — F17200 Nicotine dependence, unspecified, uncomplicated: Secondary | ICD-10-CM

## 2016-04-16 LAB — CBC WITH DIFFERENTIAL/PLATELET
BASOS ABS: 0 {cells}/uL (ref 0–200)
BASOS PCT: 0 %
EOS ABS: 222 {cells}/uL (ref 15–500)
Eosinophils Relative: 3 %
HEMATOCRIT: 43.8 % (ref 35.0–45.0)
Hemoglobin: 14.4 g/dL (ref 11.7–15.5)
Lymphocytes Relative: 37 %
Lymphs Abs: 2738 cells/uL (ref 850–3900)
MCH: 30.5 pg (ref 27.0–33.0)
MCHC: 32.9 g/dL (ref 32.0–36.0)
MCV: 92.8 fL (ref 80.0–100.0)
MONO ABS: 814 {cells}/uL (ref 200–950)
MPV: 9.7 fL (ref 7.5–12.5)
Monocytes Relative: 11 %
NEUTROS ABS: 3626 {cells}/uL (ref 1500–7800)
Neutrophils Relative %: 49 %
Platelets: 417 10*3/uL — ABNORMAL HIGH (ref 140–400)
RBC: 4.72 MIL/uL (ref 3.80–5.10)
RDW: 13.7 % (ref 11.0–15.0)
WBC: 7.4 10*3/uL (ref 3.8–10.8)

## 2016-04-16 LAB — T4, FREE: FREE T4: 0.9 ng/dL (ref 0.8–1.8)

## 2016-04-16 LAB — TSH: TSH: 2.03 mIU/L

## 2016-04-16 MED ORDER — TRAMADOL HCL 50 MG PO TABS: 50.0000 mg | ORAL_TABLET | Freq: Two times a day (BID) | ORAL | 0 refills | Status: DC | PRN

## 2016-04-16 MED ORDER — MONTELUKAST SODIUM 10 MG PO TABS: ORAL_TABLET | ORAL | 3 refills | Status: DC

## 2016-04-16 MED ORDER — FUROSEMIDE 20 MG PO TABS: 20.0000 mg | ORAL_TABLET | Freq: Every day | ORAL | 0 refills | Status: DC

## 2016-04-16 MED ORDER — ATORVASTATIN CALCIUM 40 MG PO TABS: 40.0000 mg | ORAL_TABLET | Freq: Every day | ORAL | 0 refills | Status: DC

## 2016-04-16 MED ORDER — FLUTICASONE-SALMETEROL 100-50 MCG/DOSE IN AEPB: 1.0000 | INHALATION_SPRAY | Freq: Two times a day (BID) | RESPIRATORY_TRACT | 3 refills | Status: DC

## 2016-04-16 MED ORDER — CYCLOBENZAPRINE HCL 10 MG PO TABS: 10.0000 mg | ORAL_TABLET | Freq: Three times a day (TID) | ORAL | 0 refills | Status: DC | PRN

## 2016-04-16 MED ORDER — LOSARTAN POTASSIUM 50 MG PO TABS: 50.0000 mg | ORAL_TABLET | Freq: Every day | ORAL | 0 refills | Status: DC

## 2016-04-16 MED ORDER — ALBUTEROL SULFATE HFA 108 (90 BASE) MCG/ACT IN AERS: INHALATION_SPRAY | RESPIRATORY_TRACT | 3 refills | Status: DC

## 2016-04-16 MED ORDER — FAMOTIDINE 20 MG PO TABS: 20.0000 mg | ORAL_TABLET | Freq: Two times a day (BID) | ORAL | 2 refills | Status: DC

## 2016-04-16 MED ORDER — GABAPENTIN 600 MG PO TABS: 600.0000 mg | ORAL_TABLET | Freq: Four times a day (QID) | ORAL | 0 refills | Status: DC

## 2016-04-16 MED ORDER — LABETALOL HCL 100 MG PO TABS: 100.0000 mg | ORAL_TABLET | Freq: Two times a day (BID) | ORAL | 2 refills | Status: DC

## 2016-04-16 MED ORDER — ESCITALOPRAM OXALATE 10 MG PO TABS: 10.0000 mg | ORAL_TABLET | Freq: Every day | ORAL | 3 refills | Status: DC

## 2016-04-16 MED ORDER — TEMAZEPAM 7.5 MG PO CAPS: 7.5000 mg | ORAL_CAPSULE | Freq: Every evening | ORAL | 0 refills | Status: DC | PRN

## 2016-04-16 MED ORDER — FLUTICASONE PROPIONATE 50 MCG/ACT NA SUSP: 2.0000 | Freq: Every day | NASAL | 3 refills | Status: DC

## 2016-04-16 MED ORDER — PREDNISONE 20 MG PO TABS: 40.0000 mg | ORAL_TABLET | Freq: Every day | ORAL | 0 refills | Status: DC

## 2016-04-16 MED FILL — CYCLOBENZAPRINE 10 MG TAB: 10 | 15 days supply | Qty: 45 | Fill #0

## 2016-04-16 MED FILL — traMADol HCL 50 MG TABS: 50 | 30 days supply | Qty: 60 | Fill #0

## 2016-04-16 MED FILL — GABAPENTIN 600 MG TABLET: 600 | 30 days supply | Qty: 120 | Fill #0

## 2016-04-16 MED FILL — $ADVAIR 100/50 MCG INHALER: 100-50 | 30 days supply | Qty: 60 | Fill #0

## 2016-04-16 NOTE — Progress Notes (Signed)
[name] [name], is a 60 y.o. female  [id number]  [id number]  DOB - 04-Feb-1956  CC:  Chief Complaint  Patient presents with  . Establish Care  . Medication Refill       HPI: Dim Cardi is a 60 y.o. female here today to establish medical care, w/ hx of htn, hld, mi sp pci, gerd, hypothryoidism, anxiety/panic attacks/MDD, tob abuse, asthma/copd.    Per pt, ran out of her klonopin, cannot get it at her Mental health clinic anymore, now very jittery, anxious, hard time sleeping. Difficult time taking care of her elderly mother.  She is taking her advair qday (rather than bid), and using alb mdi - 1 inhaler /month now, frequent use.  Still smoking 10cigs/day, smoking since she was 60yo.  Denies sob/doe, but gets occasionally wheezing.  Per pt, has not seen her cards or pulm MD recently. She states does not watch diet, adds salt to her  Denies etoh.  Co of bilat lower back pain as well, wi/ radiating down to her legs. Chronic pain, controlled w/ tramadols.  Patient has No headache, No chest pain, No abdominal pain - No Nausea, No new weakness tingling or numbness, No Cough.      Review of Systems: Per hpi, o/w all systems reviewed and negative.   Allergies  Allergen Reactions  . Other Anaphylaxis    avacdos  . Codeine Nausea Only  . Latex     REACTION: shortness of breath and rash   Past Medical History:  Diagnosis Date  . Anxiety    takes Lexapro daily  . Arthritis   . Asthma   . Bartholin gland cyst 08/29/2011  . Bronchitis    couple of months ago  . Bruises easily    pt is on Effient  . Chronic back pain    herniated nucleus pulposus  . COPD (chronic obstructive pulmonary disease) ([place])    early stages  . Coronary artery disease   . Depression    takes Klonopin daily  . Diverticulosis   . GERD (gastroesophageal reflux disease)    takes Nexium daily  . H/O hiatal hernia   . Heart attack 2011  . Hemorrhoids   . Hernia   . Hyperlipidemia    takes  Lipitor daily  . Hypertension    takes Losartan daily and Labetalol bid  . Hypothyroidism    takes Synthroid daily  . Insomnia    hydroxyzine prn  . Joint pain   . Neck pain   . Psoriasis    elbows,knees,back  . Shortness of breath    with exertion  . Slowing of urinary stream   . Thyroid disease    Current Outpatient Prescriptions on File Prior to Visit  Medication Sig Dispense Refill  . Calcium 600 MG tablet Take 600 mg by mouth daily.  60 tablet   . ipratropium-albuterol (DUONEB) 0.5-2.5 (3) MG/3ML SOLN Take 3 mLs by nebulization 3 (three) times daily. X 3DAYS, then change to every 4hours as needed for wheezing or shortness of breath 360 mL 3  . levothyroxine (SYNTHROID, LEVOTHROID) 100 MCG tablet Take 1 tablet (100 mcg total) by mouth daily. 30 tablet 0  . benzonatate (TESSALON) 100 MG capsule Take 1 capsule (100 mg total) by mouth 3 (three) times daily as needed for cough. (Patient not taking: Reported on 04/16/2016) 30 capsule 0  . chlorpheniramine-HYDROcodone (TUSSIONEX) 10-8 MG/5ML SUER Take 5 mLs by mouth every 12 (twelve) hours as needed for cough. (Patient not taking: Reported  on 04/16/2016) 115 mL 0  . clobetasol cream (TEMOVATE) AB-[id number] % Apply 1 application topically 2 (two) times daily. (Patient not taking: Reported on 04/16/2016) 30 g 0  . doxycycline (VIBRA-TABS) 100 MG tablet Take 1 tablet (100 mg total) by mouth 2 (two) times daily. X 7 DAYS (Patient not taking: Reported on 04/16/2016) 14 tablet 0  . levofloxacin (LEVAQUIN) 750 MG tablet Take 1 tablet (750 mg total) by mouth daily. (Patient not taking: Reported on 04/16/2016) 5 tablet 0  . nicotine (NICODERM CQ - DOSED IN MG/24 HOURS) 14 mg/24hr patch Use full patch for 7 days, then cut patch in half & use remainder, then stop. (Patient not taking: Reported on 04/16/2016) 14 patch 0  . nicotine (NICODERM CQ - DOSED IN MG/24 HOURS) 21 mg/24hr patch Place 1 patch (21 mg total) onto the skin daily. (Patient not taking: Reported on  04/16/2016) 7 patch 0  . prasugrel (EFFIENT) 10 MG TABS tablet Take 1 tablet (10 mg total) by mouth daily. 30 tablet    No current facility-administered medications on file prior to visit.    Family History  Problem Relation Age of Onset  . Heart disease Mother   . Hypertension Mother   . Stroke Mother   . Heart disease Father   . Hypertension Father   . Diabetes Father   . Cancer Maternal Aunt     breast  . Thyroid disease Paternal Aunt   . Heart disease Maternal Grandfather   . Anesthesia problems Daughter   . Cancer Paternal Grandmother     mouth  . Hypotension Neg Hx   . Malignant hyperthermia Neg Hx   . Pseudochol deficiency Neg Hx    Social History   Social History  . Marital status: Single    Spouse name: N/A  . Number of children: N/A  . Years of education: N/A   Occupational History  . Not on file.   Social History Main Topics  . Smoking status: Current Every Day Smoker    Packs/day: 1.00    Years: 44.00    Types: Cigarettes  . Smokeless tobacco: Never Used  . Alcohol use No     Comment: alcohol last night  . Drug use: No     Comment: crack cocaine last night  . Sexual activity: Not Currently    Birth control/ protection: Surgical   Other Topics Concern  . Not on file   Social History Narrative  . No narrative on file    Objective:   Vitals:   04/16/16 0901  BP: (!) 152/109  Pulse: 71  Resp: 16  Temp: 98.1 F (36.7 C)    Filed Weights   04/16/16 0901  Weight: 144 lb 3.2 oz (65.4 kg)    BP Readings from Last 3 Encounters:  04/16/16 (!) 152/109  02/21/16 130/88  11/06/15 (!) 148/86    Physical Exam: Constitutional: Patient appears well-developed and well-nourished. No distress. AAOx3 HENT: Normocephalic, atraumatic, External right and left ear normal. Oropharynx is clear and moist.  bilat TMs clear. Eyes: Conjunctivae and EOM are normal. PERRL, no scleral icterus.  Neck: Normal ROM. Neck supple. No JVD.  CVS: RRR, S1/S2 +, no  murmurs, no gallops, no carotid bruit.  Pulmonary: Effort and breath sounds normal, long exp phase w/ intermittent wheezing. No c/r noted Abdominal: Soft. BS +, no distension, tenderness, rebound or guarding.  Musculoskeletal: Normal range of motion. No edema. Mild ttp in lower back region l5 region paraspinous, nonradiating.  LE: bilat/  no c/c/e, pulses 2+ bilateral. Neuro: Alert.  muscle tone coordination wnl. No cranial nerve deficit grossly. Skin: Skin is warm and dry. No rash noted. Not diaphoretic. No erythema. No pallor. Psychiatric: Normal mood and affect. Behavior, judgment, thought content normal. Very nervous/anxious/figitiy throughout exam.  Lab Results  Component Value Date   WBC 11.5 (H) 10/30/2015   HGB 13.5 10/30/2015   HCT 41.9 10/30/2015   MCV 93.7 10/30/2015   PLT 289 10/30/2015   Lab Results  Component Value Date   CREATININE 0.84 10/30/2015   BUN 19 10/30/2015   NA 140 10/30/2015   K 4.7 10/30/2015   CL 102 10/30/2015   CO2 29 10/30/2015    Lab Results  Component Value Date   HGBA1C 6.0 06/22/2015   Lipid Panel     Component Value Date/Time   CHOL 156 06/22/2015 1049   TRIG 134 06/22/2015 1049   HDL 58 06/22/2015 1049   CHOLHDL 2.7 06/22/2015 1049   VLDL 27 06/22/2015 1049   LDLCALC 71 06/22/2015 1049       Depression screen PHQ 2/9 04/16/2016 09/11/2015 06/22/2015 05/18/2015  Decreased Interest 1 1 2 1   Down, Depressed, Hopeless 1 1 2 1   PHQ - 2 Score 2 2 4 2   Altered sleeping 3 0 2 0  Tired, decreased energy 3 1 2 1   Change in appetite 3 0 2 1  Feeling bad or failure about yourself  1 1 2  0  Trouble concentrating 1 0 2 0  Moving slowly or fidgety/restless 0 0 2 0  Suicidal thoughts 0 0 0 0  PHQ-9 Score 13 4 16 4     Assessment and plan:   1. Essential hypertension, benign - uncontrolled, diet noncompliance - readdress DASH/low salt diet w/ pt today - Lipid Panel - CBC with Differential - BASIC METABOLIC PANEL WITH GFR - renewed all  her meds for now, including labetoalol, losartan, lasix 20 qd,    2. Pulmonary emphysema, unspecified emphysema type ([place]) Suspect at baseline vs early AECOPD - instructed to use her advair bid as instructed (not daily like she is doing), prn albuterol, daily singluar. - has nebs at home prn as well - rx short 7day course prednisone taper - complete tob cessation recd, but not ready/not willing to stop  3. CAD S/P percutaneous coronary angioplasty - per pt, she gets her effient from her cardiologist. - renewed atorvastatin 40 qhs  4. Hypothyroidism, unspecified type Will rechk levels prior to renewing synthroid - TSH - T4, Free  5. TOBACCO ABUSE Total cessation recd, per pt, will call the smoking hotline for nicotine patches/gums to try again. Has been smoking since she was 60yo Now smokes 10cigs/day.  6. Estrogen deficiency - VITAMIN D 25 Hydroxy (Vit-D Deficiency, Fractures) - get DEXA next time I see her.  7. Breast cancer screening - MM Digital Screening; Future  8. Colon cancer screening - Ambulatory referral to Gastroenterology  9. Insomnia, unspecified type w/ panic attacks/anxiety/mdd - renewed lexapro 10 qday - pt asked for refills on klonopin, which my partner use to give her. - I am not comfortable with short acting BZ - will try restoril 7.5 mg prn qhs for insomnia as well as mild sedative to help w/ anxiety - avoid heavy sedative/hypnotics as much as possible in this patient, who has CAD and resp problems. - wean as able.   Return in about 4 weeks (around 05/14/2016) for asthma/ anxiety/ panic attacks.  The patient was given clear instructions to  go to ER or return to medical center if symptoms don't improve, worsen or new problems develop. The patient verbalized understanding. The patient was told to call to get lab results if they haven't heard anything in the next week.    This note has been created with Human resources officer. Any transcriptional errors are unintentional.   Maren Reamer, MD, [place], [place]   04/16/2016, 10:35 AM

## 2016-04-16 NOTE — Patient Instructions (Addendum)
Influenza Virus Vaccine injection (Fluarix) What is this medicine? INFLUENZA VIRUS VACCINE (in floo EN zuh VAHY ruhs vak SEEN) helps to reduce the risk of getting influenza also known as the flu. This medicine may be used for other purposes; ask your health care provider or pharmacist if you have questions. What should I tell my health care provider before I take this medicine? They need to know if you have any of these conditions: -bleeding disorder like hemophilia -fever or infection -Guillain-Barre syndrome or other neurological problems -immune system problems -infection with the human immunodeficiency virus (HIV) or AIDS -low blood platelet counts -multiple sclerosis -an unusual or allergic reaction to influenza virus vaccine, eggs, chicken proteins, latex, gentamicin, other medicines, foods, dyes or preservatives -pregnant or trying to get pregnant -breast-feeding How should I use this medicine? This vaccine is for injection into a muscle. It is given by a health care professional. A copy of Vaccine Information Statements will be given before each vaccination. Read this sheet carefully each time. The sheet may change frequently. Talk to your pediatrician regarding the use of this medicine in children. Special care may be needed. Overdosage: If you think you have taken too much of this medicine contact a poison control center or emergency room at once. NOTE: This medicine is only for you. Do not share this medicine with others. What if I miss a dose? This does not apply. What may interact with this medicine? -chemotherapy or radiation therapy -medicines that lower your immune system like etanercept, anakinra, infliximab, and adalimumab -medicines that treat or prevent blood clots like warfarin -phenytoin -steroid medicines like prednisone or cortisone -theophylline -vaccines This list may not describe all possible interactions. Give your health care provider a list of all the  medicines, herbs, non-prescription drugs, or dietary supplements you use. Also tell them if you smoke, drink alcohol, or use illegal drugs. Some items may interact with your medicine. What should I watch for while using this medicine? Report any side effects that do not go away within 3 days to your doctor or health care professional. Call your health care provider if any unusual symptoms occur within 6 weeks of receiving this vaccine. You may still catch the flu, but the illness is not usually as bad. You cannot get the flu from the vaccine. The vaccine will not protect against colds or other illnesses that may cause fever. The vaccine is needed every year. What side effects may I notice from receiving this medicine? Side effects that you should report to your doctor or health care professional as soon as possible: -allergic reactions like skin rash, itching or hives, swelling of the face, lips, or tongue Side effects that usually do not require medical attention (report to your doctor or health care professional if they continue or are bothersome): -fever -headache -muscle aches and pains -pain, tenderness, redness, or swelling at site where injected -weak or tired This list may not describe all possible side effects. Call your doctor for medical advice about side effects. You may report side effects to FDA at 1-800-FDA-1088. Where should I keep my medicine? This vaccine is only given in a clinic, pharmacy, doctor's office, or other health care setting and will not be stored at home. NOTE: This sheet is a summary. It may not cover all possible information. If you have questions about this medicine, talk to your doctor, pharmacist, or health care provider.    2016, Elsevier/Gold Standard. (2008-01-27 09:30:40)   - DASH Eating Plan DASH stands  for "Dietary Approaches to Stop Hypertension." The DASH eating plan is a healthy eating plan that has been shown to reduce high blood pressure  (hypertension). Additional health benefits may include reducing the risk of type 2 diabetes mellitus, heart disease, and stroke. The DASH eating plan may also help with weight loss. WHAT DO I NEED TO KNOW ABOUT THE DASH EATING PLAN? For the DASH eating plan, you will follow these general guidelines:  Choose foods with a percent daily value for sodium of less than 5% (as listed on the food label).  Use salt-free seasonings or herbs instead of table salt or sea salt.  Check with your health care provider or pharmacist before using salt substitutes.  Eat lower-sodium products, often labeled as "lower sodium" or "no salt added."  Eat fresh foods.  Eat more vegetables, fruits, and low-fat dairy products.  Choose whole grains. Look for the word "whole" as the first word in the ingredient list.  Choose fish and skinless chicken or [place] more often than red meat. Limit fish, poultry, and meat to 6 oz (170 g) each day.  Limit sweets, desserts, sugars, and sugary drinks.  Choose heart-healthy fats.  Limit cheese to 1 oz (28 g) per day.  Eat more home-cooked food and less restaurant, buffet, and fast food.  Limit fried foods.  Cook foods using methods other than frying.  Limit canned vegetables. If you do use them, rinse them well to decrease the sodium.  When eating at a restaurant, ask that your food be prepared with less salt, or no salt if possible. WHAT FOODS CAN I EAT? Seek help from a dietitian for individual calorie needs. Grains Whole grain or whole wheat bread. Brown rice. Whole grain or whole wheat pasta. Quinoa, bulgur, and whole grain cereals. Low-sodium cereals. Corn or whole wheat flour tortillas. Whole grain cornbread. Whole grain crackers. Low-sodium crackers. Vegetables Fresh or frozen vegetables (raw, steamed, roasted, or grilled). Low-sodium or reduced-sodium tomato and vegetable juices. Low-sodium or reduced-sodium tomato sauce and paste. Low-sodium or  reduced-sodium canned vegetables.  Fruits All fresh, canned (in natural juice), or frozen fruits. Meat and Other Protein Products Ground beef (85% or leaner), grass-fed beef, or beef trimmed of fat. Skinless chicken or [place]. Ground chicken or [place]. Pork trimmed of fat. All fish and seafood. Eggs. Dried beans, peas, or lentils. Unsalted nuts and seeds. Unsalted canned beans. Dairy Low-fat dairy products, such as skim or 1% milk, 2% or reduced-fat cheeses, low-fat ricotta or cottage cheese, or plain low-fat yogurt. Low-sodium or reduced-sodium cheeses. Fats and Oils Tub margarines without trans fats. Light or reduced-fat mayonnaise and salad dressings (reduced sodium). Avocado. Safflower, olive, or canola oils. Natural peanut or almond butter. Other Unsalted popcorn and pretzels. The items listed above may not be a complete list of recommended foods or beverages. Contact your dietitian for more options. WHAT FOODS ARE NOT RECOMMENDED? Grains White bread. White pasta. White rice. Refined cornbread. Bagels and croissants. Crackers that contain trans fat. Vegetables Creamed or fried vegetables. Vegetables in a cheese sauce. Regular canned vegetables. Regular canned tomato sauce and paste. Regular tomato and vegetable juices. Fruits Dried fruits. Canned fruit in light or heavy syrup. Fruit juice. Meat and Other Protein Products Fatty cuts of meat. Ribs, chicken wings, bacon, sausage, bologna, salami, chitterlings, fatback, hot dogs, bratwurst, and packaged luncheon meats. Salted nuts and seeds. Canned beans with salt. Dairy Whole or 2% milk, cream, half-and-half, and cream cheese. Whole-fat or sweetened yogurt. Full-fat cheeses or blue cheese.  Nondairy creamers and whipped toppings. Processed cheese, cheese spreads, or cheese curds. Condiments Onion and garlic salt, seasoned salt, table salt, and sea salt. Canned and packaged gravies. Worcestershire sauce. Tartar sauce. Barbecue sauce. Teriyaki  sauce. Soy sauce, including reduced sodium. Steak sauce. Fish sauce. Oyster sauce. Cocktail sauce. Horseradish. Ketchup and mustard. Meat flavorings and tenderizers. Bouillon cubes. Hot sauce. Tabasco sauce. Marinades. Taco seasonings. Relishes. Fats and Oils Butter, stick margarine, lard, shortening, ghee, and bacon fat. Coconut, palm kernel, or palm oils. Regular salad dressings. Other Pickles and olives. Salted popcorn and pretzels. The items listed above may not be a complete list of foods and beverages to avoid. Contact your dietitian for more information. WHERE CAN I FIND MORE INFORMATION? National Heart, Lung, and Blood Institute: travelstabloid.com   This information is not intended to replace advice given to you by your health care provider. Make sure you discuss any questions you have with your health care provider.   Document Released: 06/20/2011 Document Revised: 07/22/2014 Document Reviewed: 05/05/2013 Elsevier Interactive Patient Education 2016 Elsevier Inc. -  Hypertension Hypertension is another name for high blood pressure. High blood pressure forces your heart to work harder to pump blood. A blood pressure reading has two numbers, which includes a higher number over a lower number (example: 110/72). HOME CARE   Have your blood pressure rechecked by your doctor.  Only take medicine as told by your doctor. Follow the directions carefully. The medicine does not work as well if you skip doses. Skipping doses also puts you at risk for problems.  Do not smoke.  Monitor your blood pressure at home as told by your doctor. GET HELP IF:  You think you are having a reaction to the medicine you are taking.  You have repeat headaches or feel dizzy.  You have puffiness (swelling) in your ankles.  You have trouble with your vision. GET HELP RIGHT AWAY IF:   You get a very bad headache and are confused.  You feel weak, numb, or faint.  You  get chest or belly (abdominal) pain.  You throw up (vomit).  You cannot breathe very well. MAKE SURE YOU:   Understand these instructions.  Will watch your condition.  Will get help right away if you are not doing well or get worse.   This information is not intended to replace advice given to you by your health care provider. Make sure you discuss any questions you have with your health care provider.   Document Released: 12/18/2007 Document Revised: 07/06/2013 Document Reviewed: 04/23/2013 Elsevier Interactive Patient Education 2016 [place] Can Quit Smoking If you are ready to quit smoking or are thinking about it, congratulations! You have chosen to help yourself be healthier and live longer! There are lots of different ways to quit smoking. Nicotine gum, nicotine patches, a nicotine inhaler, or nicotine nasal spray can help with physical craving. Hypnosis, support groups, and medicines help break the habit of smoking. TIPS TO GET OFF AND STAY OFF CIGARETTES  Learn to predict your moods. Do not let a bad situation be your excuse to have a cigarette. Some situations in your life might tempt you to have a cigarette.  Ask friends and co-workers not to smoke around you.  Make your home smoke-free.  Never have "just one" cigarette. It leads to wanting another and another. Remind yourself of your decision to quit.  On a card, make a list of your reasons for not smoking. Read it at  least the same number of times a day as you have a cigarette. Tell yourself everyday, "I do not want to smoke. I choose not to smoke."  Ask someone at home or work to help you with your plan to quit smoking.  Have something planned after you eat or have a cup of coffee. Take a walk or get other exercise to perk you up. This will help to keep you from overeating.  Try a relaxation exercise to calm you down and decrease your stress. Remember, you may be tense and nervous the first two weeks  after you quit. This will pass.  Find new activities to keep your hands busy. Play with a pen, coin, or rubber band. Doodle or draw things on paper.  Brush your teeth right after eating. This will help cut down the craving for the taste of tobacco after meals. You can try mouthwash too.  Try gum, breath mints, or diet candy to keep something in your mouth. IF YOU SMOKE AND WANT TO QUIT:  Do not stock up on cigarettes. Never buy a carton. Wait until one pack is finished before you buy another.  Never carry cigarettes with you at work or at home.  Keep cigarettes as far away from you as possible. Leave them with someone else.  Never carry matches or a lighter with you.  Ask yourself, "Do I need this cigarette or is this just a reflex?"  Bet with someone that you can quit. Put cigarette money in a piggy bank every morning. If you smoke, you give up the money. If you do not smoke, by the end of the week, you keep the money.  Keep trying. It takes 21 days to change a habit!  Talk to your doctor about using medicines to help you quit. These include nicotine replacement gum, lozenges, or skin patches.   This information is not intended to replace advice given to you by your health care provider. Make sure you discuss any questions you have with your health care provider.   Document Released: 04/27/2009 Document Revised: 09/23/2011 Document Reviewed: 04/27/2009 Elsevier Interactive Patient Education 2016 Elsevier Inc.   -  Panic Attacks Panic attacks are sudden, short feelings of great fear or discomfort. You may have them for no reason when you are relaxed, when you are uneasy (anxious), or when you are sleeping.  HOME CARE  Take all your medicines as told.  Check with your doctor before starting new medicines.  Keep all doctor visits. GET HELP IF:  You are not able to take your medicines as told.  Your symptoms do not get better.  Your symptoms get worse. GET HELP RIGHT  AWAY IF:  Your attacks seem different than your normal attacks.  You have thoughts about hurting yourself or others.  You take panic attack medicine and you have a side effect. MAKE SURE YOU:  Understand these instructions.  Will watch your condition.  Will get help right away if you are not doing well or get worse.   This information is not intended to replace advice given to you by your health care provider. Make sure you discuss any questions you have with your health care provider.   Document Released: 08/03/2010 Document Revised: 04/21/2013 Document Reviewed: 02/12/2013 Elsevier Interactive Patient Education 2016 [place] Anxiety Disorder Generalized anxiety disorder (GAD) is a mental disorder. It interferes with life functions, including relationships, work, and school. GAD is different from normal anxiety, which everyone experiences at some  point in their lives in response to specific life events and activities. Normal anxiety actually helps [place] prepare for and get through these life events and activities. Normal anxiety goes away after the event or activity is over.  GAD causes anxiety that is not necessarily related to specific events or activities. It also causes excess anxiety in proportion to specific events or activities. The anxiety associated with GAD is also difficult to control. GAD can vary from mild to severe. People with severe GAD can have intense waves of anxiety with physical symptoms (panic attacks).  SYMPTOMS The anxiety and worry associated with GAD are difficult to control. This anxiety and worry are related to many life events and activities and also occur more days than not for 6 months or longer. People with GAD also have three or more of the following symptoms (one or more in children):  Restlessness.   Fatigue.  Difficulty concentrating.   Irritability.  Muscle tension.  Difficulty sleeping or unsatisfying  sleep. DIAGNOSIS GAD is diagnosed through an assessment by your health care provider. Your health care provider will ask you questions aboutyour mood,physical symptoms, and events in your life. Your health care provider may ask you about your medical history and use of alcohol or drugs, including prescription medicines. Your health care provider may also do a physical exam and blood tests. Certain medical conditions and the use of certain substances can cause symptoms similar to those associated with GAD. Your health care provider may refer you to a mental health specialist for further evaluation. TREATMENT The following therapies are usually used to treat GAD:   Medication. Antidepressant medication usually is prescribed for long-term daily control. Antianxiety medicines may be added in severe cases, especially when panic attacks occur.   Talk therapy (psychotherapy). Certain types of talk therapy can be helpful in treating GAD by providing support, education, and guidance. A form of talk therapy called cognitive behavioral therapy can teach you healthy ways to think about and react to daily life events and activities.  Stress managementtechniques. These include yoga, meditation, and exercise and can be very helpful when they are practiced regularly. A mental health specialist can help determine which treatment is best for you. Some people see improvement with one therapy. However, other people require a combination of therapies.   This information is not intended to replace advice given to you by your health care provider. Make sure you discuss any questions you have with your health care provider.   Document Released: 10/26/2012 Document Revised: 07/22/2014 Document Reviewed: 10/26/2012 Elsevier Interactive Patient Education [employer].

## 2016-04-16 NOTE — Progress Notes (Signed)
Pt is in the office today for establish care and medication refill Pt states her pain level today in the office is a 10 Pt states her pain is coming from her back

## 2016-04-17 ENCOUNTER — Other Ambulatory Visit: Payer: Self-pay | Admitting: Internal Medicine

## 2016-04-17 LAB — LIPID PANEL
Cholesterol: 208 mg/dL — ABNORMAL HIGH (ref 125–200)
HDL: 55 mg/dL (ref >=46)
LDL Cholesterol: 138 mg/dL — ABNORMAL HIGH (ref <130)
Total CHOL/HDL Ratio: 3.8 ratio (ref <=5.0)
Triglycerides: 74 mg/dL (ref <150)
VLDL: 15 mg/dL (ref <30)

## 2016-04-17 LAB — BASIC METABOLIC PANEL WITH GFR
BUN: 13 mg/dL (ref 7–25)
CHLORIDE: 105 mmol/L (ref 98–110)
CO2: 26 mmol/L (ref 20–31)
CREATININE: 0.93 mg/dL (ref 0.50–1.05)
Calcium: 9.3 mg/dL (ref 8.6–10.4)
GFR, Est African American: 78 mL/min (ref >=60)
GFR, Est Non African American: 67 mL/min (ref >=60)
GLUCOSE: 99 mg/dL (ref 65–99)
POTASSIUM: 3.9 mmol/L (ref 3.5–5.3)
Sodium: 139 mmol/L (ref 135–146)

## 2016-04-17 LAB — VITAMIN D 25 HYDROXY (VIT D DEFICIENCY, FRACTURES): Vit D, 25-Hydroxy: 26 ng/mL — ABNORMAL LOW (ref 30–100)

## 2016-04-17 MED ORDER — VITAMIN D (ERGOCALCIFEROL) 1.25 MG (50000 UNIT) PO CAPS: 50000.0000 [IU] | ORAL_CAPSULE | ORAL | 0 refills | Status: DC

## 2016-04-17 MED ORDER — LEVOTHYROXINE SODIUM 100 MCG PO TABS: 100.0000 ug | ORAL_TABLET | Freq: Every day | ORAL | 3 refills | Status: DC

## 2016-04-17 MED FILL — VIT D2 1.25 MG (50,000 UNIT: 1.25 MG | 84 days supply | Qty: 12 | Fill #0

## 2016-04-17 MED FILL — ESCITALOPRAM 10 MG TABLET: 10 | 3 days supply | Qty: 30 | Fill #0

## 2016-04-17 MED FILL — predniSONE 20 MG TABS: 20 | 10 days supply | Qty: 10 | Fill #0

## 2016-04-19 ENCOUNTER — Telehealth: Payer: Self-pay

## 2016-04-19 NOTE — Telephone Encounter (Signed)
Contacted pt to go over lab results pt didn't answer lvm informing pt to give me a call at her earliest convenience

## 2016-04-22 ENCOUNTER — Telehealth: Payer: Self-pay | Admitting: Internal Medicine

## 2016-04-22 NOTE — Telephone Encounter (Signed)
Will forward to Dr. Chad Cordial

## 2016-04-22 NOTE — Telephone Encounter (Signed)
The name of the medication is temazepam.

## 2016-04-22 NOTE — Telephone Encounter (Signed)
Patient called the office to speak with PCP regarding her medication. Pt needs PCP to call [facility] on [address] regarding her anxiety medication . Pt cannot afford medication. If dosage is changed then patient will be able to pay for it. Please follow up.  Thank you.

## 2016-04-22 NOTE — Telephone Encounter (Signed)
I did the med rec request from the pharmacy for med dose change today. Jay'A you should have it, fax it back. thanks

## 2016-04-23 ENCOUNTER — Telehealth: Payer: Self-pay

## 2016-04-23 NOTE — Telephone Encounter (Signed)
Contacted pt to go over lab results pt didn't answer lvm informing pt to give me a call at her earliest convenience  And I will be mailing results out today

## 2016-04-30 ENCOUNTER — Encounter: Payer: Self-pay | Admitting: Family Medicine

## 2016-04-30 ENCOUNTER — Ambulatory Visit: Payer: Self-pay | Attending: Family Medicine | Admitting: Family Medicine

## 2016-04-30 VITALS — BP 110/75 | HR 89 | Temp 99.1°F | Ht 61.0 in | Wt 147.4 lb

## 2016-04-30 DIAGNOSIS — J439 Emphysema, unspecified: Secondary | ICD-10-CM

## 2016-04-30 DIAGNOSIS — E785 Hyperlipidemia, unspecified: Secondary | ICD-10-CM | POA: Insufficient documentation

## 2016-04-30 DIAGNOSIS — F419 Anxiety disorder, unspecified: Secondary | ICD-10-CM | POA: Insufficient documentation

## 2016-04-30 DIAGNOSIS — I2510 Atherosclerotic heart disease of native coronary artery without angina pectoris: Secondary | ICD-10-CM | POA: Insufficient documentation

## 2016-04-30 DIAGNOSIS — J181 Lobar pneumonia, unspecified organism: Secondary | ICD-10-CM

## 2016-04-30 DIAGNOSIS — J189 Pneumonia, unspecified organism: Secondary | ICD-10-CM | POA: Insufficient documentation

## 2016-04-30 DIAGNOSIS — I10 Essential (primary) hypertension: Secondary | ICD-10-CM | POA: Insufficient documentation

## 2016-04-30 DIAGNOSIS — Z79899 Other long term (current) drug therapy: Secondary | ICD-10-CM | POA: Insufficient documentation

## 2016-04-30 DIAGNOSIS — F17200 Nicotine dependence, unspecified, uncomplicated: Secondary | ICD-10-CM

## 2016-04-30 DIAGNOSIS — J449 Chronic obstructive pulmonary disease, unspecified: Secondary | ICD-10-CM | POA: Insufficient documentation

## 2016-04-30 DIAGNOSIS — R0602 Shortness of breath: Secondary | ICD-10-CM | POA: Insufficient documentation

## 2016-04-30 DIAGNOSIS — F329 Major depressive disorder, single episode, unspecified: Secondary | ICD-10-CM | POA: Insufficient documentation

## 2016-04-30 DIAGNOSIS — Z720 Tobacco use: Secondary | ICD-10-CM | POA: Insufficient documentation

## 2016-04-30 MED ORDER — IPRATROPIUM-ALBUTEROL 0.5-2.5 (3) MG/3ML IN SOLN
3.0000 mL | Freq: Once | RESPIRATORY_TRACT | Status: AC
  Administered 2016-04-30: 3 mL via RESPIRATORY_TRACT

## 2016-04-30 MED ORDER — AZITHROMYCIN 250 MG PO TABS: ORAL_TABLET | ORAL | 0 refills | Status: DC

## 2016-04-30 MED ORDER — METHYLPREDNISOLONE SODIUM SUCC 125 MG IJ SOLR
125.0000 mg | Freq: Once | INTRAMUSCULAR | Status: AC
  Administered 2016-04-30: 125 mg via INTRAMUSCULAR

## 2016-04-30 MED ORDER — CEFTRIAXONE SODIUM 500 MG IJ SOLR
500.0000 mg | Freq: Once | INTRAMUSCULAR | Status: AC
  Administered 2016-04-30: 500 mg via INTRAMUSCULAR

## 2016-04-30 NOTE — Progress Notes (Signed)
Pt's O2 sat was 94% with respirations of 32 after walking to exam room. Put on 2L O2 and O2 sat raised to 97, respirations

## 2016-04-30 NOTE — Patient Instructions (Signed)
Smoking Cessation, Tips for Success If you are ready to quit smoking, congratulations! You have chosen to help yourself be healthier. Cigarettes bring nicotine, tar, carbon monoxide, and other irritants into your body. Your lungs, heart, and blood vessels will be able to work better without these poisons. There are many different ways to quit smoking. Nicotine gum, nicotine patches, a nicotine inhaler, or nicotine nasal spray can help with physical craving. Hypnosis, support groups, and medicines help break the habit of smoking. WHAT THINGS CAN I DO TO MAKE QUITTING EASIER?  Here are some tips to help you quit for good:  Pick a date when you will quit smoking completely. Tell all of your friends and family about your plan to quit on that date.  Do not try to slowly cut down on the number of cigarettes you are smoking. Pick a quit date and quit smoking completely starting on that day.  Throw away all cigarettes.   Clean and remove all ashtrays from your home, work, and car.  On a card, write down your reasons for quitting. Carry the card with you and read it when you get the urge to smoke.  Cleanse your body of nicotine. Drink enough water and fluids to keep your urine clear or pale yellow. Do this after quitting to flush the nicotine from your body.  Learn to predict your moods. Do not let a bad situation be your excuse to have a cigarette. Some situations in your life might tempt you into wanting a cigarette.  Never have "just one" cigarette. It leads to wanting another and another. Remind yourself of your decision to quit.  Change habits associated with smoking. If you smoked while driving or when feeling stressed, try other activities to replace smoking. Stand up when drinking your coffee. Brush your teeth after eating. Sit in a different chair when you read the paper. Avoid alcohol while trying to quit, and try to drink fewer caffeinated beverages. Alcohol and caffeine may urge you to  smoke.  Avoid foods and drinks that can trigger a desire to smoke, such as sugary or spicy foods and alcohol.  Ask people who smoke not to smoke around you.  Have something planned to do right after eating or having a cup of coffee. For example, plan to take a walk or exercise.  Try a relaxation exercise to calm you down and decrease your stress. Remember, you may be tense and nervous for the first 2 weeks after you quit, but this will pass.  Find new activities to keep your hands busy. Play with a pen, coin, or rubber band. Doodle or draw things on paper.  Brush your teeth right after eating. This will help cut down on the craving for the taste of tobacco after meals. You can also try mouthwash.   Use oral substitutes in place of cigarettes. Try using lemon drops, carrots, cinnamon sticks, or chewing gum. Keep them handy so they are available when you have the urge to smoke.  When you have the urge to smoke, try deep breathing.  Designate your home as a nonsmoking area.  If you are a heavy smoker, ask your health care provider about a prescription for nicotine chewing gum. It can ease your withdrawal from nicotine.  Reward yourself. Set aside the cigarette money you save and buy yourself something nice.  Look for support from others. Join a support group or smoking cessation program. Ask someone at home or at work to help you with your plan   to quit smoking.  Always ask yourself, "Do I need this cigarette or is this just a reflex?" Tell yourself, "Today, I choose not to smoke," or "I do not want to smoke." You are reminding yourself of your decision to quit.  Do not replace cigarette smoking with electronic cigarettes (commonly called e-cigarettes). The safety of e-cigarettes is unknown, and some may contain harmful chemicals.  If you relapse, do not give up! Plan ahead and think about what you will do the next time you get the urge to smoke. HOW WILL I FEEL WHEN I QUIT SMOKING? You  may have symptoms of withdrawal because your body is used to nicotine (the addictive substance in cigarettes). You may crave cigarettes, be irritable, feel very hungry, cough often, get headaches, or have difficulty concentrating. The withdrawal symptoms are only temporary. They are strongest when you first quit but will go away within 10-14 days. When withdrawal symptoms occur, stay in control. Think about your reasons for quitting. Remind yourself that these are signs that your body is healing and getting used to being without cigarettes. Remember that withdrawal symptoms are easier to treat than the major diseases that smoking can cause.  Even after the withdrawal is over, expect periodic urges to smoke. However, these cravings are generally short lived and will go away whether you smoke or not. Do not smoke! WHAT RESOURCES ARE AVAILABLE TO HELP ME QUIT SMOKING? Your health care provider can direct you to community resources or hospitals for support, which may include:  Group support.  Education.  Hypnosis.  Therapy.   This information is not intended to replace advice given to you by your health care provider. Make sure you discuss any questions you have with your health care provider.   Document Released: 03/29/2004 Document Revised: 07/22/2014 Document Reviewed: 12/17/2012 Elsevier Interactive Patient Education 2016 Elsevier Inc.  

## 2016-05-01 NOTE — Progress Notes (Signed)
Subjective:  Patient ID: [name] [name], female    DOB: 03/16/1956  Age: 60 y.o. MRN: [id number]  CC: Shortness of Breath; Wheezing; and Cough (clear sputum)   HPI [name] [name] is a 60 year old female with a history of asthma/COPD, coronary artery disease, anxiety and depression mild hyperlipidemia, hypertension who presents today with complaints of shortness of breath, wheezing, cough which have not responded to use of nebulizer treatments at home.  She was seen by her PCP on 04/16/16 where she was placed a prednisone taper and took the last dose today. Of note she treated at the ED on 02/2016 for COPD exacerbation; chest x-ray revealed hyperaeration consistent with emphysema, no active infiltrate or effusion. and was discharged on a prescription of Levaquin. She continues to smoke a pack of cigarettes per day; accompanied by a friend of hers who has been encouraging her to quit and gave her a nicotine patch today  On initial assessment and she desaturated to 94% on room air with a tachypnea of 32 for which she was placed on 2 L of oxygen with resulting improvement in oxygen saturation to 97%. Noticed to be speaking in short sentences with use of accessory muscles of respiration.  Past Medical History:  Diagnosis Date  . Anxiety    takes Lexapro daily  . Arthritis   . Asthma   . Bartholin gland cyst 08/29/2011  . Bronchitis    couple of months ago  . Bruises easily    pt is on Effient  . Chronic back pain    herniated nucleus pulposus  . COPD (chronic obstructive pulmonary disease) ([place])    early stages  . Coronary artery disease   . Depression    takes Klonopin daily  . Diverticulosis   . GERD (gastroesophageal reflux disease)    takes Nexium daily  . H/O hiatal hernia   . Heart attack 2011  . Hemorrhoids   . Hernia   . Hyperlipidemia    takes Lipitor daily  . Hypertension    takes Losartan daily and Labetalol bid  . Hypothyroidism    takes Synthroid daily  . Insomnia      hydroxyzine prn  . Joint pain   . Neck pain   . Psoriasis    elbows,knees,back  . Shortness of breath    with exertion  . Slowing of urinary stream   . Thyroid disease     Past Surgical History:  Procedure Laterality Date  . ABDOMINAL HYSTERECTOMY  1977  . COLONOSCOPY    . CORONARY ANGIOPLASTY WITH STENT PLACEMENT  2011   x 2  . esophagogastroduodenscoy    . LAPAROSCOPY  1977   exploratory  . LUMBAR LAMINECTOMY/DECOMPRESSION MICRODISCECTOMY  08/05/2011   Procedure: LUMBAR LAMINECTOMY/DECOMPRESSION MICRODISCECTOMY;  Surgeon: Otilio Connors, MD;  Location: [place] [facility];  Service: Neurosurgery;  Laterality: Right;  Right Lumbar four-five extraforaminal discectomy  . TONSILLECTOMY     as a child    Allergies  Allergen Reactions  . Other Anaphylaxis    avacdos  . Codeine Nausea Only  . Latex     REACTION: shortness of breath and rash     Outpatient Medications Prior to Visit  Medication Sig Dispense Refill  . albuterol (VENTOLIN HFA) 108 (90 Base) MCG/ACT inhaler INHALE 1 PUFF INTO THE LUNGS EVERY 6 HOURS AS NEEDED FOR WHEEZING OR SHORTNESS OF BREATH. 54 g 3  . atorvastatin (LIPITOR) 40 MG tablet Take 1 tablet (40 mg total) by mouth daily. [place]  tablet 0  . Calcium 600 MG tablet Take 600 mg by mouth daily.  60 tablet   . cyclobenzaprine (FLEXERIL) 10 MG tablet Take 1 tablet (10 mg total) by mouth 3 (three) times daily as needed for muscle spasms. 45 tablet 0  . escitalopram (LEXAPRO) 10 MG tablet Take 1 tablet (10 mg total) by mouth daily. 30 tablet 3  . famotidine (PEPCID) 20 MG tablet Take 1 tablet (20 mg total) by mouth 2 (two) times daily. 60 tablet 2  . fluticasone (FLONASE) 50 MCG/ACT nasal spray Place 2 sprays into both nostrils at bedtime. 16 g 3  . Fluticasone-Salmeterol (ADVAIR DISKUS) 100-50 MCG/DOSE AEPB Inhale 1 puff into the lungs 2 (two) times daily. 180 each 3  . furosemide (LASIX) 20 MG tablet Take 1 tablet (20 mg total) by mouth daily. 30 tablet 0  .  gabapentin (NEURONTIN) 600 MG tablet Take 1 tablet (600 mg total) by mouth 4 (four) times daily. 120 tablet 0  . ipratropium-albuterol (DUONEB) 0.5-2.5 (3) MG/3ML SOLN Take 3 mLs by nebulization 3 (three) times daily. X 3DAYS, then change to every 4hours as needed for wheezing or shortness of breath 360 mL 3  . labetalol (NORMODYNE) 100 MG tablet Take 1 tablet (100 mg total) by mouth 2 (two) times daily. 180 tablet 2  . levothyroxine (SYNTHROID, LEVOTHROID) 100 MCG tablet Take 1 tablet (100 mcg total) by mouth daily. 30 tablet 3  . losartan (COZAAR) 50 MG tablet Take 1 tablet (50 mg total) by mouth daily. 30 tablet 0  . montelukast (SINGULAIR) 10 MG tablet TAKE 1 TABLET BY MOUTH AT BEDTIME. 90 tablet 3  . nicotine (NICODERM CQ - DOSED IN MG/24 HOURS) 21 mg/24hr patch Place 1 patch (21 mg total) onto the skin daily. 7 patch 0  . prasugrel (EFFIENT) 10 MG TABS tablet Take 1 tablet (10 mg total) by mouth daily. 30 tablet   . predniSONE (DELTASONE) 20 MG tablet Take 2 tablets (40 mg total) by mouth daily with breakfast. Day 1-3 take 2 tabs qam (=40mg ), than day 4-7 take 1 tab (=20mg  ) qam, than stop 10 tablet 0  . traMADol (ULTRAM) 50 MG tablet Take 1 tablet (50 mg total) by mouth every 12 (twelve) hours as needed (cough). 60 tablet 0  . Vitamin D, Ergocalciferol, (DRISDOL) 50000 units CAPS capsule Take 1 capsule (50,000 Units total) by mouth every 7 (seven) days. 12 capsule 0  . levofloxacin (LEVAQUIN) 750 MG tablet Take 1 tablet (750 mg total) by mouth daily. 5 tablet 0  . benzonatate (TESSALON) 100 MG capsule Take 1 capsule (100 mg total) by mouth 3 (three) times daily as needed for cough. (Patient not taking: Reported on 04/16/2016) 30 capsule 0  . chlorpheniramine-HYDROcodone (TUSSIONEX) 10-8 MG/5ML SUER Take 5 mLs by mouth every 12 (twelve) hours as needed for cough. (Patient not taking: Reported on 04/16/2016) 115 mL 0  . clobetasol cream (TEMOVATE) [id number] % Apply 1 application topically 2 (two) times  daily. (Patient not taking: Reported on 04/30/2016) 30 g 0  . doxycycline (VIBRA-TABS) 100 MG tablet Take 1 tablet (100 mg total) by mouth 2 (two) times daily. X 7 DAYS (Patient not taking: Reported on 04/16/2016) 14 tablet 0  . nicotine (NICODERM CQ - DOSED IN MG/24 HOURS) 14 mg/24hr patch Use full patch for 7 days, then cut patch in half & use remainder, then stop. (Patient not taking: Reported on 04/30/2016) 14 patch 0  . temazepam (RESTORIL) 7.5 MG capsule Take 1 capsule (  7.5 mg total) by mouth at bedtime as needed for sleep. (Patient not taking: Reported on 04/30/2016) 30 capsule 0   No facility-administered medications prior to visit.     ROS Review of Systems  Constitutional: Negative for activity change, appetite change and fatigue.  HENT: Negative for congestion, sinus pressure and sore throat.   Eyes: Negative for visual disturbance.  Respiratory: Positive for cough, shortness of breath and wheezing. Negative for chest tightness.   Cardiovascular: Positive for chest pain. Negative for palpitations.  Gastrointestinal: Negative for abdominal distention, abdominal pain and constipation.  Endocrine: Negative for polydipsia.  Genitourinary: Negative for dysuria and frequency.  Musculoskeletal: Negative for arthralgias and back pain.  Skin: Negative for rash.  Neurological: Negative for tremors, light-headedness and numbness.  Hematological: Does not bruise/bleed easily.  Psychiatric/Behavioral: Negative for agitation and behavioral problems.    Objective:  BP 110/75 (BP Location: Right Arm, Patient Position: Sitting, Cuff Size: Normal)   Pulse 89   Temp 99.1 F (37.3 C) (Oral)   Ht 5\' 1"  (1.549 m)   Wt 147 lb 6.4 oz (66.9 kg)   SpO2 97%   BMI 27.85 kg/m   BP/Weight 04/30/2016 [id number]  Systolic BP A999333 0000000 [id number]  Diastolic BP 75 0000000 88  Wt. (Lbs) 147.4 144.2 -  BMI 27.85 27.25 -  Some encounter information is confidential and restricted. Go to Review Flowsheets  activity to see all data.      Physical Exam  Constitutional: She is oriented to person, place, and time. She appears well-developed and well-nourished. She appears distressed.  Neck: No JVD present.  Cardiovascular: Normal rate, normal heart sounds and intact distal pulses.   No murmur heard. Pulmonary/Chest: Accessory muscle usage present. Tachypnea noted. She has decreased breath sounds in the right middle field and the left middle field. She has wheezes. She has no rales. She exhibits no tenderness.  Abdominal: Soft. Bowel sounds are normal. She exhibits no distension and no mass. There is no tenderness.  Musculoskeletal: Normal range of motion.  Neurological: She is alert and oriented to person, place, and time.     Assessment & Plan:   1. Pneumonia of both upper lobes due to infectious organism Treating presumptive for CAP If symptoms are progressive she has been advised to present to the ED as she may need outpatient management. - azithromycin (ZITHROMAX) 250 MG tablet; Take 2 tablets (500 mg) orally on day one, then 1 tablet (250 mg) on days 2-5.  Dispense: 6 tablet; Refill: 0 - methylPREDNISolone sodium succinate (SOLU-MEDROL) 125 mg/2 mL injection 125 mg; Inject 2 mLs (125 mg total) into the muscle once.  2. Pulmonary emphysema, unspecified emphysema type (HCC) Continue Advair and Proventil Albuterol nebulizers at home every 6 hrs when necessary - methylPREDNISolone sodium succinate (SOLU-MEDROL) 125 mg/2 mL injection 125 mg; Inject 2 mLs (125 mg total) into the muscle once. - cefTRIAXone (ROCEPHIN) injection 500 mg; Inject 500 mg into the muscle once. - ipratropium-albuterol (DUONEB) 0.5-2.5 (3) MG/3ML nebulizer solution 3 mL; Take 3 mLs by nebulization once.  3. TOBACCO ABUSE Discussed smoking cessation and she is willing to work on quitting; currently wearing a nicotine patch - methylPREDNISolone sodium succinate (SOLU-MEDROL) 125 mg/2 mL injection 125 mg; Inject 2 mLs  (125 mg total) into the muscle once.  4. Shortness of breath Could be due to community-acquired pneumonia versus COPD exacerbation Lung exam improved with decreased wheezing after treatment. - methylPREDNISolone sodium succinate (SOLU-MEDROL) 125 mg/2 mL injection 125 mg; Inject 2  mLs (125 mg total) into the muscle once. - cefTRIAXone (ROCEPHIN) injection 500 mg; Inject 500 mg into the muscle once. - ipratropium-albuterol (DUONEB) 0.5-2.5 (3) MG/3ML nebulizer solution 3 mL; Take 3 mLs by nebulization once.   Meds ordered this encounter  Medications  . azithromycin (ZITHROMAX) 250 MG tablet    Sig: Take 2 tablets (500 mg) orally on day one, then 1 tablet (250 mg) on days 2-5.    Dispense:  6 tablet    Refill:  0  . methylPREDNISolone sodium succinate (SOLU-MEDROL) 125 mg/2 mL injection 125 mg  . cefTRIAXone (ROCEPHIN) injection 500 mg  . ipratropium-albuterol (DUONEB) 0.5-2.5 (3) MG/3ML nebulizer solution 3 mL    Follow-up: Return in about 2 weeks (around 05/14/2016) for Follow-up of Pneumonia with PCP.   Arnoldo Morale MD

## 2016-05-10 MED FILL — LABETALOL HCL 100 MG TABLET: 100 | 30 days supply | Qty: 60 | Fill #0

## 2016-05-10 MED FILL — FUROSEMIDE 20 MG TABLET: 20 | 30 days supply | Qty: 30 | Fill #0

## 2016-05-10 MED FILL — ?ATORVASTATIN 40MG TABLET: 40 | 30 days supply | Qty: 30 | Fill #0

## 2016-05-10 MED FILL — ?LEVOTHYROXINE 100 MCG TAB: 100 | 30 days supply | Qty: 30 | Fill #0

## 2016-05-17 ENCOUNTER — Encounter: Payer: Self-pay | Admitting: Internal Medicine

## 2016-05-22 ENCOUNTER — Other Ambulatory Visit: Payer: Self-pay | Admitting: Internal Medicine

## 2016-05-22 MED FILL — LOSARTAN POTASSIUM 50 MG TA: 50 | 30 days supply | Qty: 30 | Fill #0

## 2016-05-22 MED FILL — $ADVAIR 100/50 MCG INHALER: 100-50 | 30 days supply | Qty: 60 | Fill #1

## 2016-05-22 MED FILL — ?MONTELUKAST SOD 10 MG TAB: 10 MG | 30 days supply | Qty: 30 | Fill #0

## 2016-05-27 ENCOUNTER — Other Ambulatory Visit: Payer: Self-pay | Admitting: Internal Medicine

## 2016-05-27 DIAGNOSIS — M549 Dorsalgia, unspecified: Principal | ICD-10-CM

## 2016-05-27 DIAGNOSIS — G8929 Other chronic pain: Secondary | ICD-10-CM

## 2016-05-27 MED FILL — ?CYCLOBENZAPRINE 10 MG TABL: 10 | 5 days supply | Qty: 45 | Fill #0

## 2016-05-27 MED FILL — FAMOTIDINE 20 MG TABLET: 20 | 30 days supply | Qty: 60 | Fill #0

## 2016-05-27 MED FILL — ESCITALOPRAM 10 MG TABLET: 10 | 3 days supply | Qty: 30 | Fill #1

## 2016-05-27 MED FILL — VENTOLIN HFA 90 MCG INHALER: 108 (90 BAS | 20 days supply | Qty: 18 | Fill #0

## 2016-05-27 MED FILL — FLUTICASONE PROP 50 MCG SPR: 50 | 30 days supply | Qty: 16 | Fill #0

## 2016-05-30 ENCOUNTER — Other Ambulatory Visit: Payer: Self-pay | Admitting: Internal Medicine

## 2016-06-04 ENCOUNTER — Other Ambulatory Visit: Payer: Self-pay | Admitting: Internal Medicine

## 2016-06-04 MED ORDER — TEMAZEPAM 15 MG PO CAPS: 15.0000 mg | ORAL_CAPSULE | Freq: Every evening | ORAL | 0 refills | Status: DC | PRN

## 2016-06-04 NOTE — Progress Notes (Signed)
Pt requested refill of temazepam. Last time filled, 04/26/16, 15mg  tabs due to cost, rather than 7.5mg  tabs.    Will fill refill request from [facility] today, #15 tabs total.  - will discard printed erx, since filled out [facility] med request.

## 2016-06-05 ENCOUNTER — Telehealth: Payer: Self-pay | Admitting: Internal Medicine

## 2016-06-05 NOTE — Telephone Encounter (Signed)
Patient needs a refill for temazepam.Please follow up.

## 2016-06-10 NOTE — Telephone Encounter (Signed)
This was filled 06/04/16, please f/u.  thx

## 2016-06-14 ENCOUNTER — Other Ambulatory Visit: Payer: Self-pay | Admitting: Internal Medicine

## 2016-06-14 MED FILL — ?GABAPENTIN 600 MG TABLET: 600 | 30 days supply | Qty: 120 | Fill #0

## 2016-06-14 MED FILL — ATORVASTATIN 40 MG TABLET: 40 | 30 days supply | Qty: 30 | Fill #0

## 2016-06-14 MED FILL — ?LEVOTHYROXINE 100 MCG TAB: 100 | 30 days supply | Qty: 30 | Fill #1

## 2016-06-14 MED FILL — LABETALOL HCL 100 MG TABLET: 100 | 30 days supply | Qty: 60 | Fill #1

## 2016-06-14 MED FILL — FUROSEMIDE 20 MG TABLET: 20 | 30 days supply | Qty: 30 | Fill #0

## 2016-06-18 ENCOUNTER — Encounter: Payer: Self-pay | Admitting: Family Medicine

## 2016-06-18 ENCOUNTER — Ambulatory Visit: Payer: Self-pay | Attending: Family Medicine | Admitting: Family Medicine

## 2016-06-18 VITALS — BP 122/76 | HR 79 | Temp 98.8°F | Ht 61.5 in | Wt 147.8 lb

## 2016-06-18 DIAGNOSIS — Z91040 Latex allergy status: Secondary | ICD-10-CM | POA: Insufficient documentation

## 2016-06-18 DIAGNOSIS — Z885 Allergy status to narcotic agent status: Secondary | ICD-10-CM | POA: Insufficient documentation

## 2016-06-18 DIAGNOSIS — I252 Old myocardial infarction: Secondary | ICD-10-CM | POA: Insufficient documentation

## 2016-06-18 DIAGNOSIS — F418 Other specified anxiety disorders: Secondary | ICD-10-CM

## 2016-06-18 DIAGNOSIS — G8929 Other chronic pain: Secondary | ICD-10-CM | POA: Insufficient documentation

## 2016-06-18 DIAGNOSIS — I10 Essential (primary) hypertension: Secondary | ICD-10-CM | POA: Insufficient documentation

## 2016-06-18 DIAGNOSIS — F329 Major depressive disorder, single episode, unspecified: Secondary | ICD-10-CM | POA: Insufficient documentation

## 2016-06-18 DIAGNOSIS — F17200 Nicotine dependence, unspecified, uncomplicated: Secondary | ICD-10-CM | POA: Insufficient documentation

## 2016-06-18 DIAGNOSIS — E039 Hypothyroidism, unspecified: Secondary | ICD-10-CM | POA: Insufficient documentation

## 2016-06-18 DIAGNOSIS — G4700 Insomnia, unspecified: Secondary | ICD-10-CM | POA: Insufficient documentation

## 2016-06-18 DIAGNOSIS — M549 Dorsalgia, unspecified: Secondary | ICD-10-CM | POA: Insufficient documentation

## 2016-06-18 DIAGNOSIS — F419 Anxiety disorder, unspecified: Secondary | ICD-10-CM | POA: Insufficient documentation

## 2016-06-18 DIAGNOSIS — L409 Psoriasis, unspecified: Secondary | ICD-10-CM | POA: Insufficient documentation

## 2016-06-18 DIAGNOSIS — E785 Hyperlipidemia, unspecified: Secondary | ICD-10-CM | POA: Insufficient documentation

## 2016-06-18 DIAGNOSIS — J439 Emphysema, unspecified: Secondary | ICD-10-CM | POA: Insufficient documentation

## 2016-06-18 DIAGNOSIS — I2510 Atherosclerotic heart disease of native coronary artery without angina pectoris: Secondary | ICD-10-CM | POA: Insufficient documentation

## 2016-06-18 DIAGNOSIS — K219 Gastro-esophageal reflux disease without esophagitis: Secondary | ICD-10-CM | POA: Insufficient documentation

## 2016-06-18 DIAGNOSIS — Z79899 Other long term (current) drug therapy: Secondary | ICD-10-CM | POA: Insufficient documentation

## 2016-06-18 DIAGNOSIS — Z7951 Long term (current) use of inhaled steroids: Secondary | ICD-10-CM | POA: Insufficient documentation

## 2016-06-18 MED ORDER — OMEPRAZOLE 40 MG PO CPDR: 40.0000 mg | DELAYED_RELEASE_CAPSULE | Freq: Every day | ORAL | 3 refills | Status: DC

## 2016-06-18 MED ORDER — FLUOCINOLONE ACETONIDE 0.01 % EX CREA: TOPICAL_CREAM | Freq: Two times a day (BID) | CUTANEOUS | 0 refills | Status: DC

## 2016-06-18 MED ORDER — LOSARTAN POTASSIUM 50 MG PO TABS: 50.0000 mg | ORAL_TABLET | Freq: Every day | ORAL | 0 refills | Status: DC

## 2016-06-18 MED ORDER — CLOTRIMAZOLE 1 % EX CREA: 1.0000 "application " | TOPICAL_CREAM | Freq: Two times a day (BID) | CUTANEOUS | 0 refills | Status: DC

## 2016-06-18 MED ORDER — GABAPENTIN 600 MG PO TABS: ORAL_TABLET | ORAL | 3 refills | Status: DC

## 2016-06-18 MED ORDER — NICOTINE 14 MG/24HR TD PT24: 14.0000 mg | MEDICATED_PATCH | Freq: Every day | TRANSDERMAL | 1 refills | Status: DC

## 2016-06-18 MED ORDER — HYDROXYZINE PAMOATE 25 MG PO CAPS: 25.0000 mg | ORAL_CAPSULE | Freq: Three times a day (TID) | ORAL | 2 refills | Status: DC | PRN

## 2016-06-18 NOTE — Progress Notes (Signed)
Subjective:  Patient ID: [name] [name], female    DOB: 1955/11/30  Age: 60 y.o. MRN: [id number]  CC: Follow-up (pneumonia) and Hypertension   HPI [name] [name] was treated for community-acquired pneumonia and COPD exacerbation at her last office visit and comes in today for a follow-up visit. Medical history is significant for COPD, tobacco abuse, hypertension, depression and anxiety, GERD, hypothyroidism.  She reports breathing has improved significantly and denies wheezing, shortness of breath or chest pain.  She complains of insomnia and multiple nighttime awakening; she also complains of anxiety. 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 Denies suicidal ideation or intents. Was previously followed by mental health at [facility] where she received Klonopin and is wondering if she can be prescribed that as well; she is already on Celexa.  She complains of some ODD not helping her reflux symptoms and would like to be switched to Prilosec which helped in the past. Compliant with all other medications.  Past Medical History:  Diagnosis Date  . Anxiety    takes Lexapro daily  . Arthritis   . Asthma   . Bartholin gland cyst 08/29/2011  . Bronchitis    couple of months ago  . Bruises easily    pt is on Effient  . Chronic back pain    herniated nucleus pulposus  . COPD (chronic obstructive pulmonary disease) ([place])    early stages  . Coronary artery disease   . Depression    takes Klonopin daily  . Diverticulosis   . GERD (gastroesophageal reflux disease)    takes Nexium daily  . H/O hiatal hernia   . Heart attack 2011  . Hemorrhoids   . Hernia   . Hyperlipidemia    takes Lipitor daily  . Hypertension    takes Losartan daily and Labetalol bid  . Hypothyroidism    takes Synthroid daily  . Insomnia    hydroxyzine prn  . Joint pain   . Neck pain   . Psoriasis    elbows,knees,back  . Shortness of breath    with exertion  .  Slowing of urinary stream   . Thyroid disease     Past Surgical History:  Procedure Laterality Date  . ABDOMINAL HYSTERECTOMY  1977  . COLONOSCOPY    . CORONARY ANGIOPLASTY WITH STENT PLACEMENT  2011   x 2  . esophagogastroduodenscoy    . LAPAROSCOPY  1977   exploratory  . LUMBAR LAMINECTOMY/DECOMPRESSION MICRODISCECTOMY  08/05/2011   Procedure: LUMBAR LAMINECTOMY/DECOMPRESSION MICRODISCECTOMY;  Surgeon: Otilio Connors, MD;  Location: [place] [facility];  Service: Neurosurgery;  Laterality: Right;  Right Lumbar four-five extraforaminal discectomy  . TONSILLECTOMY     as a child    Allergies  Allergen Reactions  . Other Anaphylaxis    avacdos  . Codeine Nausea Only  . Latex     REACTION: shortness of breath and rash     Outpatient Medications Prior to Visit  Medication Sig Dispense Refill  . albuterol (VENTOLIN HFA) 108 (90 Base) MCG/ACT inhaler INHALE 1 PUFF INTO THE LUNGS EVERY 6 HOURS AS NEEDED FOR WHEEZING OR SHORTNESS OF BREATH. 54 g 3  . atorvastatin (LIPITOR) 40 MG tablet TAKE 1 TABLET BY MOUTH DAILY. 30 tablet 3  . Calcium 600 MG tablet Take 600 mg by mouth daily.  60 tablet   . cyclobenzaprine (FLEXERIL) 10 MG tablet TAKE 1 TABLET BY MOUTH 3 TIMES DAILY AS NEEDED FOR MUSCLE SPASMS. [place]  tablet 0  . escitalopram (LEXAPRO) 10 MG tablet Take 1 tablet (10 mg total) by mouth daily. 30 tablet 3  . fluticasone (FLONASE) 50 MCG/ACT nasal spray Place 2 sprays into both nostrils at bedtime. 16 g 3  . Fluticasone-Salmeterol (ADVAIR DISKUS) 100-50 MCG/DOSE AEPB Inhale 1 puff into the lungs 2 (two) times daily. 180 each 3  . furosemide (LASIX) 20 MG tablet TAKE 1 TABLET BY MOUTH DAILY. 30 tablet 3  . ipratropium-albuterol (DUONEB) 0.5-2.5 (3) MG/3ML SOLN Take 3 mLs by nebulization 3 (three) times daily. X 3DAYS, then change to every 4hours as needed for wheezing or shortness of breath 360 mL 3  . labetalol (NORMODYNE) 100 MG tablet Take 1 tablet (100 mg total) by mouth 2 (two) times daily.  180 tablet 2  . levothyroxine (SYNTHROID, LEVOTHROID) 100 MCG tablet Take 1 tablet (100 mcg total) by mouth daily. 30 tablet 3  . montelukast (SINGULAIR) 10 MG tablet TAKE 1 TABLET BY MOUTH AT BEDTIME. 90 tablet 3  . prasugrel (EFFIENT) 10 MG TABS tablet Take 1 tablet (10 mg total) by mouth daily. 30 tablet   . Vitamin D, Ergocalciferol, (DRISDOL) 50000 units CAPS capsule Take 1 capsule (50,000 Units total) by mouth every 7 (seven) days. 12 capsule 0  . clobetasol cream (TEMOVATE) [id number] % Apply 1 application topically 2 (two) times daily. 30 g 0  . gabapentin (NEURONTIN) 600 MG tablet TAKE 1 TABLET BY MOUTH 4 TIMES DAILY 120 tablet 0  . losartan (COZAAR) 50 MG tablet Take 1 tablet (50 mg total) by mouth daily. 30 tablet 0  . temazepam (RESTORIL) 15 MG capsule Take 1 capsule (15 mg total) by mouth at bedtime as needed for sleep. 15 capsule 0  . traMADol (ULTRAM) 50 MG tablet Take 1 tablet (50 mg total) by mouth every 12 (twelve) hours as needed (cough). 60 tablet 0  . nicotine (NICODERM CQ - DOSED IN MG/24 HOURS) 21 mg/24hr patch Place 1 patch (21 mg total) onto the skin daily. (Patient not taking: Reported on 06/18/2016) 7 patch 0  . atorvastatin (LIPITOR) 40 MG tablet TAKE 1 TABLET BY MOUTH DAILY. 30 tablet 0  . azithromycin (ZITHROMAX) 250 MG tablet Take 2 tablets (500 mg) orally on day one, then 1 tablet (250 mg) on days 2-5. 6 tablet 0  . benzonatate (TESSALON) 100 MG capsule Take 1 capsule (100 mg total) by mouth 3 (three) times daily as needed for cough. (Patient not taking: Reported on 06/18/2016) 30 capsule 0  . chlorpheniramine-HYDROcodone (TUSSIONEX) 10-8 MG/5ML SUER Take 5 mLs by mouth every 12 (twelve) hours as needed for cough. (Patient not taking: Reported on 04/16/2016) 115 mL 0  . doxycycline (VIBRA-TABS) 100 MG tablet Take 1 tablet (100 mg total) by mouth 2 (two) times daily. X 7 DAYS (Patient not taking: Reported on 04/16/2016) 14 tablet 0  . famotidine (PEPCID) 20 MG tablet Take 1  tablet (20 mg total) by mouth 2 (two) times daily. (Patient not taking: Reported on 06/18/2016) 60 tablet 2  . nicotine (NICODERM CQ - DOSED IN MG/24 HOURS) 14 mg/24hr patch Use full patch for 7 days, then cut patch in half & use remainder, then stop. (Patient not taking: Reported on 06/18/2016) 14 patch 0  . predniSONE (DELTASONE) 20 MG tablet Take 2 tablets (40 mg total) by mouth daily with breakfast. Day 1-3 take 2 tabs qam (=40mg ), than day 4-7 take 1 tab (=20mg  ) qam, than stop 10 tablet 0   No facility-administered medications prior  to visit.     ROS Review of Systems  Constitutional: Negative for activity change, appetite change and fatigue.  HENT: Negative for congestion, sinus pressure and sore throat.   Eyes: Negative for visual disturbance.  Respiratory: Negative for cough, chest tightness, shortness of breath and wheezing.   Cardiovascular: Negative for chest pain and palpitations.  Gastrointestinal: Negative for abdominal distention, abdominal pain and constipation.  Endocrine: Negative for polydipsia.  Genitourinary: Negative for dysuria and frequency.  Musculoskeletal: Negative for arthralgias and back pain.  Skin: Negative for rash.  Neurological: Negative for tremors, light-headedness and numbness.  Hematological: Does not bruise/bleed easily.  Psychiatric/Behavioral: Positive for sleep disturbance. Negative for agitation and behavioral problems.       Positive for anxiety    Objective:  BP 122/76 (BP Location: Right Arm, Patient Position: Sitting, Cuff Size: Small)   Pulse 79   Temp 98.8 F (37.1 C) (Oral)   Ht 5' 1.5" (1.562 m)   Wt 147 lb 12.8 oz (67 kg)   SpO2 98%   BMI 27.47 kg/m   BP/Weight 06/18/2016 04/30/2016 99991111  Systolic BP 123XX123 A999333 0000000  Diastolic BP 76 75 0000000  Wt. (Lbs) 147.8 147.4 144.2  BMI 27.47 27.85 27.25  Some encounter information is confidential and restricted. Go to Review Flowsheets activity to see all data.      Physical Exam    Constitutional: She is oriented to person, place, and time. She appears well-developed and well-nourished.  Cardiovascular: Normal rate, normal heart sounds and intact distal pulses.   No murmur heard. Pulmonary/Chest: [place] normal and breath sounds normal. She has no wheezes. She has no rales. She exhibits no tenderness.  Abdominal: Soft. Bowel sounds are normal. She exhibits no distension and no mass. There is no tenderness.  Musculoskeletal: Normal range of motion.  Neurological: She is alert and oriented to person, place, and time.  Skin:  Psoriatic plaques on both elbows and dorsum of hands. Splitting of tips of fingers of both hands     Lab Results  Component Value Date   TSH 2.03 04/16/2016    Assessment & Plan:   1. Psoriasis Uncontrolled on triamcinolone - fluocinolone (VANOS) 0.01 % cream; Apply topically 2 (two) times daily.  Dispense: 30 g; Refill: 0  2. Hypothyroidism, unspecified type Controlled Continue levothyroxine  3. Pulmonary emphysema, unspecified emphysema type (HCC) Stable, no acute exacerbations Advised the smoking cessation will help with her progression of condition  4. Anxiety and depression Uncontrolled She is requesting clonazepam which I have informed the I will be unable to prescribe due to habit forming nature; if symptoms still persist will try BuSpar. Continue Celexa - hydrOXYzine (VISTARIL) 25 MG capsule; Take 1 capsule (25 mg total) by mouth 3 (three) times daily as needed.  Dispense: 90 capsule; Refill: 2 - Drug Screen 10 W/Conf, Se  5. Essential hypertension, benign Controlled - losartan (COZAAR) 50 MG tablet; Take 1 tablet (50 mg total) by mouth daily.  Dispense: 30 tablet; Refill: 0  6. Gastroesophageal reflux disease, esophagitis presence not specified Uncontrolled on famotidine Switched to Prilosec - omeprazole (PRILOSEC) 40 MG capsule; Take 1 capsule (40 mg total) by mouth daily.  Dispense: 30 capsule; Refill: 3  7. TOBACCO  ABUSE Spent 3 minutes counseling on cessation and she is working on quitting. - nicotine (NICODERM CQ - DOSED IN MG/24 HOURS) 14 mg/24hr patch; Place 1 patch (14 mg total) onto the skin daily.  Dispense: 28 patch; Refill: 1   Meds ordered this  encounter  Medications  . nicotine (NICODERM CQ - DOSED IN MG/24 HOURS) 14 mg/24hr patch    Sig: Place 1 patch (14 mg total) onto the skin daily.    Dispense:  28 patch    Refill:  1  . losartan (COZAAR) 50 MG tablet    Sig: Take 1 tablet (50 mg total) by mouth daily.    Dispense:  30 tablet    Refill:  0    Must have office visit for refills  . gabapentin (NEURONTIN) 600 MG tablet    Sig: TAKE 1 TABLET BY MOUTH 4 TIMES DAILY    Dispense:  120 tablet    Refill:  3  . hydrOXYzine (VISTARIL) 25 MG capsule    Sig: Take 1 capsule (25 mg total) by mouth 3 (three) times daily as needed.    Dispense:  90 capsule    Refill:  2  . fluocinolone (VANOS) 0.01 % cream    Sig: Apply topically 2 (two) times daily.    Dispense:  30 g    Refill:  0  . omeprazole (PRILOSEC) 40 MG capsule    Sig: Take 1 capsule (40 mg total) by mouth daily.    Dispense:  30 capsule    Refill:  3  . clotrimazole (LOTRIMIN) 1 % cream    Sig: Apply 1 application topically 2 (two) times daily. apply to tips of fingers    Dispense:  30 g    Refill:  0    Follow-up: Return in about 3 months (around 09/16/2016) for Follow-up on anxiety.   Arnoldo Morale MD

## 2016-06-18 NOTE — Progress Notes (Signed)
Medication refill

## 2016-06-18 NOTE — Patient Instructions (Signed)
Psoriasis Introduction Psoriasis is a long-term (chronic) condition of skin inflammation. It occurs because your immune system causes skin cells to form too quickly. As a result, too many skin cells grow and create raised, red patches (plaques) that look silvery on your skin. Plaques may appear anywhere on your body. They can be any size or shape. Psoriasis can come and go. The condition varies from mild to very severe. It cannot be passed from one person to another (not contagious). What are the causes? The cause of psoriasis is not known, but certain factors can make the condition worse. These include:  Damage or trauma to the skin, such as cuts, scrapes, sunburn, and dryness.  Lack of sunlight.  Certain medicines.  Alcohol.  Tobacco use.  Stress.  Infections caused by bacteria or viruses. What increases the risk? This condition is more likely to develop in:  People with a family history of psoriasis.  People who are Caucasian.  People who are between the ages of 15-57 and 5-72 years old. What are the signs or symptoms? There are five different types of psoriasis. You can have more than one type of psoriasis during your life. Types are:  Plaque.  Guttate.  Inverse.  Pustular.  Erythrodermic. Each type of psoriasis has different symptoms.  Plaque psoriasis symptoms include red, raised plaques with a silvery white coating (scale). These plaques may be itchy. Your nails may be pitted and crumbly or fall off.  Guttate psoriasis symptoms include small red spots that often show up on your trunk, arms, and legs. These spots may develop after you have been sick, especially with strep throat.  Inverse psoriasis symptoms include plaques in your underarm area, under your breasts, or on your genitals, groin, or buttocks.  Pustular psoriasis symptoms include pus-filled bumps that are painful, red, and swollen on the palms of your hands or the soles of your feet. You also may  feel exhausted, feverish, weak, or have no appetite.  Erythrodermic psoriasis symptoms include bright red skin that may look burned. You may have a fast heartbeat and a body temperature that is too high or too low. You may be itchy or in pain. How is this diagnosed? Your health care provider may suspect psoriasis based on your symptoms and family history. Your health care provider will also do a physical exam. This may include a procedure to remove a tissue sample (biopsy) for testing. You may also be referred to a health care provider who specializes in skin diseases (dermatologist). How is this treated? There is no cure for this condition, but treatment can help manage it. Goals of treatment include:  Helping your skin heal.  Reducing itching and inflammation.  Slowing the growth of new skin cells.  Helping your immune system respond better to your skin. Treatment varies, depending on the severity of your condition. Treatment may include:  Creams or ointments.  Ultraviolet ray exposure (light therapy). This may include natural sunlight or light therapy in a medical office.  Medicines (systemic therapy). These medicines can help your body better manage skin cell turnover and inflammation. They may be used along with light therapy or ointments. You may also get antibiotic medicines if you have an infection. Follow these instructions at home: [place] your skin as needed. Only use moisturizers that have been approved by your health care provider.  Apply cool compresses to the affected areas.  Do not scratch your skin. Lifestyle  Do not use tobacco products. This includes cigarettes,  chewing tobacco, and e-cigarettes. If you need help quitting, ask your health care provider.  Drink little or no alcohol.  Try techniques for stress reduction, such as meditation or yoga.  Get exposure to the sun as told by your health care provider. Do not get sunburned.  Consider  joining a psoriasis support group. Medicines  Take or use over-the-counter and prescription medicines only as told by your health care provider.  If you were prescribed an antibiotic, take or use it as told by your health care provider. Do not stop taking the antibiotic even if your condition starts to improve. General instructions  Keep a journal to help track what triggers an outbreak. Try to avoid any triggers.  See a counselor or social worker if feelings of sadness, frustration, and hopelessness about your condition are interfering with your work and relationships.  Keep all follow-up visits as told by your health care provider. This is important. Contact a health care provider if:  Your pain gets worse.  You have increasing redness or warmth in the affected areas.  You have new or worsening pain or stiffness in your joints.  Your nails start to break easily or pull away from the nail bed.  You have a fever.  You feel depressed. This information is not intended to replace advice given to you by your health care provider. Make sure you discuss any questions you have with your health care provider. Document Released: 06/28/2000 Document Revised: 12/07/2015 Document Reviewed: 11/16/2014  2017 Elsevier

## 2016-06-25 ENCOUNTER — Other Ambulatory Visit: Payer: Self-pay | Admitting: Internal Medicine

## 2016-06-25 MED FILL — ?MONTELUKAST SOD 10 MG TAB: 10 MG | 30 days supply | Qty: 30 | Fill #1

## 2016-06-26 LAB — DRUG SCREEN 10 W/CONF, SERUM

## 2016-06-28 ENCOUNTER — Telehealth: Payer: Self-pay

## 2016-06-28 NOTE — Telephone Encounter (Signed)
-----   Message from Arnoldo Morale, MD sent at 06/26/2016  1:55 PM EST ----- Drug screen came back positive for cocaine

## 2016-06-28 NOTE — Telephone Encounter (Signed)
After several attempts to try and reach patient by phone a letter was mailed to patient with her lab results.

## 2016-07-01 ENCOUNTER — Telehealth: Payer: Self-pay | Admitting: *Deleted

## 2016-07-01 MED FILL — LOSARTAN POTASSIUM 50 MG TA: 50 | 30 days supply | Qty: 30 | Fill #0

## 2016-07-01 MED FILL — CYCLOBENZAPRINE 10 MG TAB: 10 | 15 days supply | Qty: 45 | Fill #0

## 2016-07-01 MED FILL — VIT D2 1.25 MG (50,000 UNIT: 1.25 MG | 84 days supply | Qty: 12 | Fill #0

## 2016-07-02 ENCOUNTER — Ambulatory Visit: Payer: Self-pay | Attending: Internal Medicine

## 2016-07-02 MED FILL — $ADVAIR 100/50 MCG INHALER: 100-50 | 30 days supply | Qty: 60 | Fill #2

## 2016-07-02 MED FILL — ESCITALOPRAM 10 MG TABLET: 10 | 3 days supply | Qty: 30 | Fill #2

## 2016-07-02 NOTE — Telephone Encounter (Signed)
Form waiting for Dr. Evette Georges signature then will fax to [facility]

## 2016-07-02 NOTE — Telephone Encounter (Signed)
Form faxed today

## 2016-07-22 ENCOUNTER — Other Ambulatory Visit: Payer: Self-pay | Admitting: Family Medicine

## 2016-07-22 MED ORDER — FLUTICASONE-SALMETEROL 100-50 MCG/DOSE IN AEPB: 1.0000 | INHALATION_SPRAY | Freq: Two times a day (BID) | RESPIRATORY_TRACT | 3 refills | Status: DC

## 2016-07-22 MED FILL — GABAPENTIN 600 MG TABLET: 600 | 30 days supply | Qty: 120 | Fill #0

## 2016-07-23 MED FILL — ATORVASTATIN 40 MG TABLET: 40 | 30 days supply | Qty: 30 | Fill #0

## 2016-07-23 MED FILL — CYCLOBENZAPRINE 10 MG TAB: 10 | 15 days supply | Qty: 45 | Fill #1

## 2016-07-23 MED FILL — FLUOCINONIDE 0.1% CREAM: 0.1 | 15 days supply | Qty: 30 | Fill #0

## 2016-07-23 MED FILL — hydrOXYzine HCL 25 MG TABS: 25 | 30 days supply | Qty: 90 | Fill #0

## 2016-07-23 MED FILL — OMEPRAZOLE DR 40 MG CAPSULE: 40 | 30 days supply | Qty: 30 | Fill #0

## 2016-07-23 MED FILL — LABETALOL HCL 100 MG TABLET: 100 | 30 days supply | Qty: 60 | Fill #2

## 2016-07-23 MED FILL — $VENTOLIN HFA 18G INHALER: 108 (90 BAS | 20 days supply | Qty: 18 | Fill #1

## 2016-07-26 MED FILL — ?FUROSEMIDE 20 MG TABLET: 20 | 30 days supply | Qty: 30 | Fill #1

## 2016-07-26 MED FILL — ?LOSARTAN POTASSIUM 50 MG T: 50 MG | 30 days supply | Qty: 30 | Fill #0

## 2016-07-26 MED FILL — ?LEVOTHYROXINE 100 MCG TAB: 100 | 30 days supply | Qty: 30 | Fill #2

## 2016-07-26 MED FILL — $ADVAIR 100/50 MCG INHALER: 100-50 | 30 days supply | Qty: 60 | Fill #3

## 2016-07-26 MED FILL — ?ESCITALPRAM 10 MG TABLET: 10 MG | 3 days supply | Qty: 30 | Fill #3

## 2016-08-05 ENCOUNTER — Ambulatory Visit: Payer: Self-pay | Attending: Internal Medicine | Admitting: Physician Assistant

## 2016-08-05 ENCOUNTER — Encounter: Payer: Self-pay | Admitting: Physician Assistant

## 2016-08-05 VITALS — BP 144/69 | HR 72 | Temp 98.5°F | Resp 18 | Ht 61.5 in | Wt 148.2 lb

## 2016-08-05 DIAGNOSIS — F419 Anxiety disorder, unspecified: Secondary | ICD-10-CM | POA: Insufficient documentation

## 2016-08-05 DIAGNOSIS — F329 Major depressive disorder, single episode, unspecified: Secondary | ICD-10-CM | POA: Insufficient documentation

## 2016-08-05 DIAGNOSIS — J440 Chronic obstructive pulmonary disease with acute lower respiratory infection: Secondary | ICD-10-CM | POA: Insufficient documentation

## 2016-08-05 DIAGNOSIS — J209 Acute bronchitis, unspecified: Secondary | ICD-10-CM | POA: Insufficient documentation

## 2016-08-05 MED ORDER — AMOXICILLIN-POT CLAVULANATE 875-125 MG PO TABS: 1.0000 | ORAL_TABLET | Freq: Two times a day (BID) | ORAL | 0 refills | Status: DC

## 2016-08-05 MED ORDER — SALINE NASAL SPRAY 0.65 % NA SOLN: 1.0000 | NASAL | 12 refills | Status: DC | PRN

## 2016-08-05 MED ORDER — DM-GG & DM-APAP-CPM 10-20 &15-200-2 MG PO MISC: 1.0000 | ORAL | 0 refills | Status: AC

## 2016-08-05 NOTE — Progress Notes (Signed)
Subjective:     Patient ID: [name] [name], female   DOB: November 05, 1955, 61 y.o.   MRN: [id number]  HPI  [name] [name] is a 61 yo white female with a PMH significant for COPD, asthma, anxiety, and depression that presents with 10 day history of cough and nasal congestion. Some SOB but attributed to baseline of COPD. Coughs occasionally throughout the day and night. No close contacts with the same. Has taken Robitussin OTC with little to no relief. Denies fever, chills, nausea, vomiting, headache, chest pain, abdominal pain, rash, or GI/GU sxs.     Review of Systems  Constitutional: Positive for fatigue. Negative for chills and fever.  HENT: Positive for congestion and rhinorrhea. Negative for ear pain, sinus pain, sinus pressure, sore throat and trouble swallowing.   Eyes: Negative for pain and discharge.  Respiratory: Positive for cough, shortness of breath and wheezing. Negative for chest tightness.   Cardiovascular: Negative for chest pain and leg swelling.  Gastrointestinal: Negative for abdominal pain, nausea and vomiting.  Musculoskeletal: Negative for back pain, myalgias, neck pain and neck stiffness.  Skin: Negative for rash.  Neurological: Negative for weakness and headaches.       Objective:   Physical Exam  Constitutional: She is oriented to person, place, and time. She appears well-developed.  HENT:  Head: Normocephalic and atraumatic.  Right Ear: External ear normal.  Left Ear: External ear normal.  Mouth/Throat: Oropharynx is clear and moist.  Tonsils absent. Turbinates moderately hypertrophic and mildly erythematous.  Eyes: Conjunctivae are normal.  Neck: Normal range of motion. Neck supple.  Cardiovascular: Normal rate, regular rhythm and normal heart sounds.  Exam reveals no gallop and no friction rub.   No murmur heard. Pulmonary/Chest: [place]ort normal. No respiratory distress. She has wheezes. She has no rales.  Lymphadenopathy:    She has no cervical adenopathy.   Neurological: She is alert and oriented to person, place, and time.  Skin: Skin is warm and dry. No rash noted. No pallor.  Psychiatric: She has a normal mood and affect. Her behavior is normal.       Assessment:     Acute bronchitis, unspecified organism Plan:    Plan  1. Augmentin as directed 2. Coricidin HBP as directed 3. Saline Nasal Flush as directed and prn 4. Please call if no better within 72 hours.

## 2016-08-05 NOTE — Progress Notes (Signed)
Patient is here for Cold Sx  Patient complains of cold symptoms being present for the past 2 weeks. Symptoms began as a sore throat and progressed to a productive cough. Patient has used OTC robotussim with minimal relief.  Patient has taken medication today. Patient has eaten today.

## 2016-08-05 NOTE — Patient Instructions (Addendum)
Please return in 2 weeks to discuss Pneumococcal vaccination and other health maintenance checks that are outstanding.    Acute Bronchitis, Adult Acute bronchitis is when air tubes (bronchi) in the lungs suddenly get swollen. The condition can make it hard to breathe. It can also cause these symptoms:  A cough.  Coughing up clear, yellow, or green mucus.  Wheezing.  Chest congestion.  Shortness of breath.  A fever.  Body aches.  Chills.  A sore throat. Follow these instructions at home: Medicines  Take over-the-counter and prescription medicines only as told by your doctor.  If you were prescribed an antibiotic medicine, take it as told by your doctor. Do not stop taking the antibiotic even if you start to feel better. General instructions  Rest.  Drink enough fluids to keep your pee (urine) clear or pale yellow.  Avoid smoking and secondhand smoke. If you smoke and you need help quitting, ask your doctor. Quitting will help your lungs heal faster.  Use an inhaler, cool mist vaporizer, or humidifier as told by your doctor.  Keep all follow-up visits as told by your doctor. This is important. How is this prevented? To lower your risk of getting this condition again:  Wash your hands often with soap and water. If you cannot use soap and water, use hand sanitizer.  Avoid contact with people who have cold symptoms.  Try not to touch your hands to your mouth, nose, or eyes.  Make sure to get the flu shot every year. Contact a doctor if:  Your symptoms do not get better in 2 weeks. Get help right away if:  You cough up blood.  You have chest pain.  You have very bad shortness of breath.  You become dehydrated.  You faint (pass out) or keep feeling like you are going to pass out.  You keep throwing up (vomiting).  You have a very bad headache.  Your fever or chills gets worse. This information is not intended to replace advice given to you by your health  care provider. Make sure you discuss any questions you have with your health care provider. Document Released: 12/18/2007 Document Revised: 02/07/2016 Document Reviewed: 12/20/2015 Elsevier Interactive Patient Education  2017 [employer].

## 2016-08-20 ENCOUNTER — Other Ambulatory Visit: Payer: Self-pay | Admitting: Internal Medicine

## 2016-08-20 ENCOUNTER — Other Ambulatory Visit: Payer: Self-pay | Admitting: Family Medicine

## 2016-08-20 MED FILL — MONTELUKAST SOD 10 MG TAB: 10 | 30 days supply | Qty: 30 | Fill #2

## 2016-08-20 MED FILL — ?FUROSEMIDE 20 MG TABLET: 20 | 30 days supply | Qty: 30 | Fill #2

## 2016-08-20 MED FILL — ATORVASTATIN 40 MG TABLET: 40 | 30 days supply | Qty: 30 | Fill #0

## 2016-08-20 MED FILL — LABETALOL HCL 100 MG TABLET: 100 | 30 days supply | Qty: 60 | Fill #3

## 2016-08-20 MED FILL — ESCITALOPRAM 10 MG TABLET: 10 | 30 days supply | Qty: 30 | Fill #0

## 2016-08-20 MED FILL — OMEPRAZOLE DR 40 MG CAPSULE: 40 | 30 days supply | Qty: 30 | Fill #1

## 2016-08-20 MED FILL — ?LEVOTHYROXINE 100 MCG TAB: 100 | 30 days supply | Qty: 30 | Fill #3

## 2016-08-20 MED FILL — CYCLOBENZAPRINE 10 MG TAB: 10 | 15 days supply | Qty: 45 | Fill #2

## 2016-08-20 MED FILL — ?LOSARTAN POTASSIUM 50 MG T: 50 MG | 30 days supply | Qty: 30 | Fill #1

## 2016-08-21 ENCOUNTER — Encounter: Payer: Self-pay | Admitting: Physician Assistant

## 2016-08-21 ENCOUNTER — Ambulatory Visit: Payer: Self-pay | Attending: Physician Assistant | Admitting: Physician Assistant

## 2016-08-21 VITALS — BP 144/94 | HR 78 | Temp 98.6°F | Resp 28 | Wt 147.8 lb

## 2016-08-21 DIAGNOSIS — F17200 Nicotine dependence, unspecified, uncomplicated: Secondary | ICD-10-CM | POA: Insufficient documentation

## 2016-08-21 DIAGNOSIS — I252 Old myocardial infarction: Secondary | ICD-10-CM | POA: Insufficient documentation

## 2016-08-21 DIAGNOSIS — G4700 Insomnia, unspecified: Secondary | ICD-10-CM | POA: Insufficient documentation

## 2016-08-21 DIAGNOSIS — I10 Essential (primary) hypertension: Secondary | ICD-10-CM | POA: Insufficient documentation

## 2016-08-21 DIAGNOSIS — Z79899 Other long term (current) drug therapy: Secondary | ICD-10-CM | POA: Insufficient documentation

## 2016-08-21 DIAGNOSIS — M1990 Unspecified osteoarthritis, unspecified site: Secondary | ICD-10-CM | POA: Insufficient documentation

## 2016-08-21 DIAGNOSIS — F419 Anxiety disorder, unspecified: Secondary | ICD-10-CM | POA: Insufficient documentation

## 2016-08-21 DIAGNOSIS — I2510 Atherosclerotic heart disease of native coronary artery without angina pectoris: Secondary | ICD-10-CM | POA: Insufficient documentation

## 2016-08-21 DIAGNOSIS — J449 Chronic obstructive pulmonary disease, unspecified: Secondary | ICD-10-CM | POA: Insufficient documentation

## 2016-08-21 DIAGNOSIS — F329 Major depressive disorder, single episode, unspecified: Secondary | ICD-10-CM | POA: Insufficient documentation

## 2016-08-21 DIAGNOSIS — K219 Gastro-esophageal reflux disease without esophagitis: Secondary | ICD-10-CM | POA: Insufficient documentation

## 2016-08-21 DIAGNOSIS — L409 Psoriasis, unspecified: Secondary | ICD-10-CM | POA: Insufficient documentation

## 2016-08-21 DIAGNOSIS — J4541 Moderate persistent asthma with (acute) exacerbation: Secondary | ICD-10-CM | POA: Insufficient documentation

## 2016-08-21 DIAGNOSIS — E039 Hypothyroidism, unspecified: Secondary | ICD-10-CM | POA: Insufficient documentation

## 2016-08-21 DIAGNOSIS — Z7951 Long term (current) use of inhaled steroids: Secondary | ICD-10-CM | POA: Insufficient documentation

## 2016-08-21 DIAGNOSIS — E785 Hyperlipidemia, unspecified: Secondary | ICD-10-CM | POA: Insufficient documentation

## 2016-08-21 MED ORDER — VITAMIN D 50 MCG (2000 UT) PO TABS: 2000.0000 [IU] | ORAL_TABLET | Freq: Every day | ORAL | 0 refills | Status: AC

## 2016-08-21 MED ORDER — LOSARTAN POTASSIUM 50 MG PO TABS: 50.0000 mg | ORAL_TABLET | Freq: Every day | ORAL | 0 refills | Status: DC

## 2016-08-21 MED ORDER — METHYLPREDNISOLONE SODIUM SUCC 125 MG IJ SOLR
125.0000 mg | Freq: Once | INTRAMUSCULAR | Status: AC
  Administered 2016-08-21: 125 mg via INTRAMUSCULAR

## 2016-08-21 MED ORDER — VITAMIN D 50 MCG (2000 UT) PO TABS: 2000.0000 [IU] | ORAL_TABLET | Freq: Every day | ORAL | 0 refills | Status: DC

## 2016-08-21 MED ORDER — ESCITALOPRAM OXALATE 10 MG PO TABS: 10.0000 mg | ORAL_TABLET | Freq: Every day | ORAL | 1 refills | Status: DC

## 2016-08-21 MED ORDER — PREDNISONE 10 MG (21) PO TBPK: 10.0000 mg | ORAL_TABLET | Freq: Every day | ORAL | 0 refills | Status: DC

## 2016-08-21 MED ORDER — MONTELUKAST SODIUM 10 MG PO TABS: 10.0000 mg | ORAL_TABLET | Freq: Every day | ORAL | 5 refills | Status: DC

## 2016-08-21 MED ORDER — IPRATROPIUM-ALBUTEROL 0.5-2.5 (3) MG/3ML IN SOLN
3.0000 mL | Freq: Once | RESPIRATORY_TRACT | Status: AC
  Administered 2016-08-21: 3 mL via RESPIRATORY_TRACT

## 2016-08-21 MED ORDER — ALBUTEROL SULFATE HFA 108 (90 BASE) MCG/ACT IN AERS: 2.0000 | INHALATION_SPRAY | RESPIRATORY_TRACT | 5 refills | Status: DC | PRN

## 2016-08-21 MED ORDER — ATORVASTATIN CALCIUM 40 MG PO TABS: 40.0000 mg | ORAL_TABLET | Freq: Every day | ORAL | 1 refills | Status: DC

## 2016-08-21 MED ORDER — FLUTICASONE FUROATE-VILANTEROL 200-25 MCG/INH IN AEPB: 1.0000 | INHALATION_SPRAY | Freq: Every day | RESPIRATORY_TRACT | 5 refills | Status: DC

## 2016-08-21 MED FILL — ?PREDNISONE 10 MG TABLET: 10 | 6 days supply | Qty: 21 | Fill #0

## 2016-08-21 MED FILL — $VENTOLIN HFA 18G INHALER: 108 (90 BAS | 25 days supply | Qty: 18 | Fill #0

## 2016-08-21 MED FILL — !BREO ELLIPTA 200-25 MCG: 200-25 | 30 days supply | Qty: 60 | Fill #0

## 2016-08-21 MED FILL — HYDROXYZINE PAM 25 MG CAP: 25 | 30 days supply | Qty: 90 | Fill #0

## 2016-08-21 NOTE — Progress Notes (Signed)
Presents with SOB, Needs refills on meds

## 2016-08-21 NOTE — Progress Notes (Signed)
Subjective:  Patient ID: [name] [name], female    DOB: 1955/08/06  Age: 61 y.o. MRN: [id number]  CC: SOB  HPI [name] [name] is a 61 y.o. female with a PMH of   Past Medical History:  Diagnosis Date  . Anxiety    takes Lexapro daily  . Arthritis   . Asthma   . Bartholin gland cyst 08/29/2011  . Bronchitis    couple of months ago  . Bruises easily    pt is on Effient  . Chronic back pain    herniated nucleus pulposus  . COPD (chronic obstructive pulmonary disease) ([place])    early stages  . Coronary artery disease   . Depression    takes Klonopin daily  . Diverticulosis   . GERD (gastroesophageal reflux disease)    takes Nexium daily  . H/O hiatal hernia   . Heart attack 2011  . Hemorrhoids   . Hernia   . Hyperlipidemia    takes Lipitor daily  . Hypertension    takes Losartan daily and Labetalol bid  . Hypothyroidism    takes Synthroid daily  . Insomnia    hydroxyzine prn  . Joint pain   . Neck pain   . Psoriasis    elbows,knees,back  . Shortness of breath    with exertion  . Slowing of urinary stream   . Thyroid disease     that presents with acute asthma exacerbation and need for medication refills. Montelukast is one of the medications that needs refills. Uses Advair 100-50, albuterol inhaler, and albuterol nebulizer but still reports poor control of asthma. Admits to smoking. Does not endorse recent illness/infection, chest pain, headache, abdominal pain, swelling, rash, or GI/GU sxs.    Outpatient Medications Prior to Visit  Medication Sig Dispense Refill  . Calcium 600 MG tablet Take 600 mg by mouth daily.  60 tablet   . clotrimazole (LOTRIMIN) 1 % cream Apply 1 application topically 2 (two) times daily. apply to tips of fingers 30 g 0  . cyclobenzaprine (FLEXERIL) 10 MG tablet TAKE 1 TABLET BY MOUTH 3 TIMES DAILY AS NEEDED FOR MUSCLE SPASMS. 45 tablet 2  . fluocinolone (VANOS) 0.01 % cream Apply topically 2 (two) times daily. 30 g 0  . fluticasone  (FLONASE) 50 MCG/ACT nasal spray Place 2 sprays into both nostrils at bedtime. 16 g 3  . Fluticasone-Salmeterol (ADVAIR DISKUS) 100-50 MCG/DOSE AEPB Inhale 1 puff into the lungs 2 (two) times daily. 180 each 3  . furosemide (LASIX) 20 MG tablet TAKE 1 TABLET BY MOUTH DAILY. 30 tablet 3  . gabapentin (NEURONTIN) 600 MG tablet TAKE 1 TABLET BY MOUTH 4 TIMES DAILY 120 tablet 0  . hydrOXYzine (VISTARIL) 25 MG capsule Take 1 capsule (25 mg total) by mouth 3 (three) times daily as needed. 90 capsule 2  . labetalol (NORMODYNE) 100 MG tablet Take 1 tablet (100 mg total) by mouth 2 (two) times daily. 180 tablet 2  . levothyroxine (SYNTHROID, LEVOTHROID) 100 MCG tablet Take 1 tablet (100 mcg total) by mouth daily. 30 tablet 3  . omeprazole (PRILOSEC) 40 MG capsule Take 1 capsule (40 mg total) by mouth daily. 30 capsule 3  . prasugrel (EFFIENT) 10 MG TABS tablet Take 1 tablet (10 mg total) by mouth daily. 30 tablet   . sodium chloride (OCEAN) 0.65 % nasal spray Place 1 spray into the nose as needed for congestion. 30 mL 12  . Vitamin D, Ergocalciferol, (DRISDOL) 50000 units CAPS capsule TAKE  1 CAPSULE BY MOUTH EVERY 7 DAYS. 12 capsule 0  . albuterol (VENTOLIN HFA) 108 (90 Base) MCG/ACT inhaler INHALE 1 PUFF INTO THE LUNGS EVERY 6 HOURS AS NEEDED FOR WHEEZING OR SHORTNESS OF BREATH. 54 g 3  . atorvastatin (LIPITOR) 40 MG tablet TAKE 1 TABLET BY MOUTH DAILY. 30 tablet 3  . atorvastatin (LIPITOR) 40 MG tablet TAKE 1 TABLET BY MOUTH DAILY. 30 tablet 2  . escitalopram (LEXAPRO) 10 MG tablet TAKE 1 TABLET BY MOUTH DAILY. 30 tablet 3  . losartan (COZAAR) 50 MG tablet TAKE 1 TABLET BY MOUTH DAILY. 30 tablet 2  . ipratropium-albuterol (DUONEB) 0.5-2.5 (3) MG/3ML SOLN Take 3 mLs by nebulization 3 (three) times daily. X 3DAYS, then change to every 4hours as needed for wheezing or shortness of breath (Patient not taking: Reported on 08/21/2016) 360 mL 3  . nicotine (NICODERM CQ - DOSED IN MG/24 HOURS) 14 mg/24hr patch Place  1 patch (14 mg total) onto the skin daily. (Patient not taking: Reported on 08/05/2016) 28 patch 1  . nicotine (NICODERM CQ - DOSED IN MG/24 HOURS) 21 mg/24hr patch Place 1 patch (21 mg total) onto the skin daily. (Patient not taking: Reported on 08/05/2016) 7 patch 0  . traMADol (ULTRAM) 50 MG tablet TAKE 1 TABLET BY MOUTH EVERY 12 HOURS AS NEEDED (Patient not taking: Reported on 08/21/2016) 60 tablet 0  . montelukast (SINGULAIR) 10 MG tablet TAKE 1 TABLET BY MOUTH AT BEDTIME. (Patient not taking: Reported on 08/21/2016) 90 tablet 3   No facility-administered medications prior to visit.      ROS Review of Systems  Constitutional: Negative for chills, diaphoresis and fever.  HENT: Negative for congestion.   Respiratory: Positive for cough, shortness of breath and wheezing. Negative for hemoptysis.   Cardiovascular: Negative for chest pain.  Gastrointestinal: Negative for abdominal pain, nausea and vomiting.  Skin: Negative for rash.  Neurological: Negative for headaches.    Objective:  BP (!) 144/94 (BP Location: Left Arm, Patient Position: Sitting, Cuff Size: Normal)   Pulse 78   Temp 98.6 F (37 C)   Resp (!) 28   Wt 147 lb 12.8 oz (67 kg)   SpO2 94%   BMI 27.47 kg/m   BP/Weight 08/21/2016 08/05/2016 123XX123  Systolic BP 123456 123456 [id number]  Diastolic BP 94 69 76  Wt. (Lbs) 147.8 148.2 147.8  BMI 27.47 27.55 27.47  Some encounter information is confidential and restricted. Go to Review Flowsheets activity to see all data.      Physical Exam  Constitutional: She is oriented to person, place, and time.  Occasional cough, mildly increased respiratory effort, hoarse voice, polite  HENT:  Head: Normocephalic and atraumatic.  Eyes: No scleral icterus.  Cardiovascular: Normal rate, regular rhythm and normal heart sounds.   Pulmonary/Chest:  Diffuse wheezing bilaterally, no rhonchi, no rales  Abdominal: Soft. Bowel sounds are normal.  Musculoskeletal: She exhibits no edema.    Neurological: She is alert and oriented to person, place, and time.  Skin: Skin is warm and dry. No rash noted.  Psychiatric: She has a normal mood and affect. Her behavior is normal. Thought content normal.     Assessment & Plan:   1. Moderate persistent asthma with exacerbation Pt exhibits poor control with Advair Diskus 100-50. Using albuterol everyday. Will change to [facility]. Acute exacerbation tx given in clinic. Will reassess control of asthma in two weeks.  - ipratropium-albuterol (DUONEB) 0.5-2.5 (3) MG/3ML nebulizer solution 3 mL; Take 3 mLs by  nebulization once. - methylPREDNISolone sodium succinate (SOLU-MEDROL) 125 mg/2 mL injection 125 mg; Inject 2 mLs (125 mg total) into the muscle once.   Meds ordered this encounter  Medications  . DISCONTD: fluticasone furoate-vilanterol (BREO ELLIPTA) 200-25 MCG/INH AEPB    Sig: Inhale 1 puff into the lungs daily.    Dispense:  1 each    Refill:  5    Order Specific Question:   Supervising Provider    Answer:   Tresa Garter [id number]  . DISCONTD: montelukast (SINGULAIR) 10 MG tablet    Sig: Take 1 tablet (10 mg total) by mouth at bedtime.    Dispense:  30 tablet    Refill:  5    Order Specific Question:   Supervising Provider    Answer:   Tresa Garter [id number]  . DISCONTD: predniSONE (STERAPRED UNI-PAK 21 TAB) 10 MG (21) TBPK tablet    Sig: Take 1 tablet (10 mg total) by mouth daily.    Dispense:  21 tablet    Refill:  0    Order Specific Question:   Supervising Provider    Answer:   Tresa Garter [id number]  . DISCONTD: albuterol (PROVENTIL HFA;VENTOLIN HFA) 108 (90 Base) MCG/ACT inhaler    Sig: Inhale 2 puffs into the lungs every 4 (four) hours as needed for wheezing or shortness of breath.    Dispense:  1 Inhaler    Refill:  5    Order Specific Question:   Supervising Provider    Answer:   Tresa Garter [id number]  . ipratropium-albuterol (DUONEB) 0.5-2.5 (3) MG/3ML nebulizer solution 3  mL  . methylPREDNISolone sodium succinate (SOLU-MEDROL) 125 mg/2 mL injection 125 mg  . DISCONTD: Cholecalciferol (VITAMIN D) 2000 units tablet    Sig: Take 1 tablet (2,000 Units total) by mouth daily.    Dispense:  90 tablet    Refill:  0    Order Specific Question:   Supervising Provider    Answer:   Tresa Garter [id number]  . DISCONTD: losartan (COZAAR) 50 MG tablet    Sig: Take 1 tablet (50 mg total) by mouth daily.    Dispense:  90 tablet    Refill:  0    Order Specific Question:   Supervising Provider    Answer:   Tresa Garter [id number]  . DISCONTD: escitalopram (LEXAPRO) 10 MG tablet    Sig: Take 1 tablet (10 mg total) by mouth daily.    Dispense:  90 tablet    Refill:  1    Order Specific Question:   Supervising Provider    Answer:   Tresa Garter [id number]  . DISCONTD: atorvastatin (LIPITOR) 40 MG tablet    Sig: Take 1 tablet (40 mg total) by mouth daily.    Dispense:  90 tablet    Refill:  1    Order Specific Question:   Supervising Provider    Answer:   Tresa Garter [id number]  . albuterol (PROVENTIL HFA;VENTOLIN HFA) 108 (90 Base) MCG/ACT inhaler    Sig: Inhale 2 puffs into the lungs every 4 (four) hours as needed for wheezing or shortness of breath.    Dispense:  1 Inhaler    Refill:  5    Order Specific Question:   Supervising Provider    Answer:   Tresa Garter [id number]  . atorvastatin (LIPITOR) 40 MG tablet    Sig: Take 1 tablet (40 mg total) by mouth daily.    Dispense:  90 tablet  Refill:  1    Order Specific Question:   Supervising Provider    Answer:   Tresa Garter [id number]  . Cholecalciferol (VITAMIN D) 2000 units tablet    Sig: Take 1 tablet (2,000 Units total) by mouth daily.    Dispense:  90 tablet    Refill:  0    Order Specific Question:   Supervising Provider    Answer:   Tresa Garter [id number]  . escitalopram (LEXAPRO) 10 MG tablet    Sig: Take 1 tablet (10 mg total) by mouth daily.     Dispense:  90 tablet    Refill:  1    Order Specific Question:   Supervising Provider    Answer:   Tresa Garter [id number]  . fluticasone furoate-vilanterol (BREO ELLIPTA) 200-25 MCG/INH AEPB    Sig: Inhale 1 puff into the lungs daily.    Dispense:  1 each    Refill:  5    Order Specific Question:   Supervising Provider    Answer:   Tresa Garter [id number]  . montelukast (SINGULAIR) 10 MG tablet    Sig: Take 1 tablet (10 mg total) by mouth at bedtime.    Dispense:  30 tablet    Refill:  5    Order Specific Question:   Supervising Provider    Answer:   Tresa Garter [id number]  . losartan (COZAAR) 50 MG tablet    Sig: Take 1 tablet (50 mg total) by mouth daily.    Dispense:  90 tablet    Refill:  0    Order Specific Question:   Supervising Provider    Answer:   Tresa Garter [id number]  . predniSONE (STERAPRED UNI-PAK 21 TAB) 10 MG (21) TBPK tablet    Sig: Take 1 tablet (10 mg total) by mouth daily.    Dispense:  21 tablet    Refill:  0    Order Specific Question:   Supervising Provider    Answer:   Tresa Garter [id number]    Follow-up: Return in about 2 weeks (around 09/04/2016) for f/u asthma.   Clent Demark PA

## 2016-08-21 NOTE — Patient Instructions (Addendum)
Please begin taking Prednisone pills tomorrow 08/22/16. Return in 2 weeks to evaluate the efficacy and control of your asthma with the new medication.  Asthma, Acute Bronchospasm Acute bronchospasm caused by asthma is also referred to as an asthma attack. Bronchospasm means your air passages become narrowed. The narrowing is caused by inflammation and tightening of the muscles in the air tubes (bronchi) in your lungs. This can make it hard to breathe or cause you to wheeze and cough. What are the causes? Possible triggers are:  Animal dander from the skin, hair, or feathers of animals.  Dust mites contained in house dust.  Cockroaches.  Pollen from trees or grass.  Mold.  Cigarette or tobacco smoke.  Air pollutants such as dust, household cleaners, hair sprays, aerosol sprays, paint fumes, strong chemicals, or strong odors.  Cold air or weather changes. Cold air may trigger inflammation. Winds increase molds and pollens in the air.  Strong emotions such as crying or laughing hard.  Stress.  Certain medicines such as aspirin or beta-blockers.  Sulfites in foods and drinks, such as dried fruits and wine.  Infections or inflammatory conditions, such as a flu, cold, or inflammation of the nasal membranes (rhinitis).  Gastroesophageal reflux disease (GERD). GERD is a condition where stomach acid backs up into your esophagus.  Exercise or strenuous activity. What are the signs or symptoms?  Wheezing.  Excessive coughing, particularly at night.  Chest tightness.  Shortness of breath. How is this diagnosed? Your health care provider will ask you about your medical history and perform a physical exam. A chest X-ray or blood testing may be performed to look for other causes of your symptoms or other conditions that may have triggered your asthma attack. How is this treated? Treatment is aimed at reducing inflammation and opening up the airways in your lungs. Most asthma attacks  are treated with inhaled medicines. These include quick relief or rescue medicines (such as bronchodilators) and controller medicines (such as inhaled corticosteroids). These medicines are sometimes given through an inhaler or a nebulizer. Systemic steroid medicine taken by mouth or given through an IV tube also can be used to reduce the inflammation when an attack is moderate or severe. Antibiotic medicines are only used if a bacterial infection is present. Follow these instructions at home:  Rest.  Drink plenty of liquids. This helps the mucus to remain thin and be easily coughed up. Only use caffeine in moderation and do not use alcohol until you have recovered from your illness.  Do not smoke. Avoid being exposed to secondhand smoke.  You play a critical role in keeping yourself in good health. Avoid exposure to things that cause you to wheeze or to have breathing problems.  Keep your medicines up-to-date and available. Carefully follow your health care provider's treatment plan.  Take your medicine exactly as prescribed.  When pollen or pollution is bad, keep windows closed and use an air conditioner or go to places with air conditioning.  Asthma requires careful medical care. See your health care provider for a follow-up as advised. If you are more than [redacted] weeks pregnant and you were prescribed any new medicines, let your obstetrician know about the visit and how you are doing. Follow up with your health care provider as directed.  After you have recovered from your asthma attack, make an appointment with your outpatient doctor to talk about ways to reduce the likelihood of future attacks. If you do not have a doctor who manages your  asthma, make an appointment with a primary care doctor to discuss your asthma. Get help right away if:  You are getting worse.  You have trouble breathing. If severe, call your local emergency services (911 in the [place]  You develop chest pain or  discomfort.  You are vomiting.  You are not able to keep fluids down.  You are coughing up yellow, green, brown, or bloody sputum.  You have a fever and your symptoms suddenly get worse.  You have trouble swallowing. This information is not intended to replace advice given to you by your health care provider. Make sure you discuss any questions you have with your health care provider. Document Released: 10/16/2006 Document Revised: 12/13/2015 Document Reviewed: 01/06/2013 Elsevier Interactive Patient Education  2017 [place] for [name] [name]  Printed: 08/21/2016 Doctor's Name: Domenica Fail PA-C, Phone Number: [phone]  Please bring this plan to each visit to our office or the emergency room.  GREEN ZONE: Doing Well  No cough, wheeze, chest tightness or shortness of breath during the day or night Can do your usual activities  Take these long-term-control medicines each day  Breo Ellipta  Take these medicines before exercise if your asthma is exercise-induced  Medicine How much to take When to take it  albuterol (PROVENTIL,VENTOLIN) 2 puffs with a spacer 15 minutes before exercise   YELLOW ZONE: Asthma is Getting Worse  Cough, wheeze, chest tightness or shortness of breath or Waking at night due to asthma, or Can do some, but not all, usual activities  Take quick-relief medicine - and keep taking your GREEN ZONE medicines  Take the albuterol (PROVENTIL,VENTOLIN) inhaler 2 puffs every 20 minutes for up to 1 hour with a spacer.   If your symptoms do not improve after 1 hour of above treatment, or if the albuterol (PROVENTIL,VENTOLIN) is not lasting 4 hours between treatments: Call your doctor to be seen    RED ZONE: Medical Alert!  Very short of breath, or Quick relief medications have not helped, or Cannot do usual activities, or Symptoms are same or worse after 24 hours in the Yellow Zone  First, take these medicines:  Take the albuterol  (PROVENTIL,VENTOLIN) inhaler 2 puffs every 20 minutes for up to 1 hour with a spacer.  Then call your medical provider NOW! Go to the hospital or call an ambulance if: You are still in the Red Zone after 15 minutes, AND You have not reached your medical provider DANGER SIGNS  Trouble walking and talking due to shortness of breath, or Lips or fingernails are blue Take 4 puffs of your quick relief medicine with a spacer, AND Go to the hospital or call for an ambulance (call 911) NOW!

## 2016-09-20 ENCOUNTER — Other Ambulatory Visit: Payer: Self-pay | Admitting: Family Medicine

## 2016-09-20 ENCOUNTER — Other Ambulatory Visit: Payer: Self-pay | Admitting: Internal Medicine

## 2016-09-20 DIAGNOSIS — K219 Gastro-esophageal reflux disease without esophagitis: Secondary | ICD-10-CM

## 2016-09-20 MED FILL — MONTELUKAST SOD 10 MG TAB: 10 | 30 days supply | Qty: 30 | Fill #3

## 2016-09-20 MED FILL — LEVOTHYROXINE 100 MCG TAB: 100 | 30 days supply | Qty: 30 | Fill #0

## 2016-09-20 MED FILL — GABAPENTIN 600 MG TABLET: 600 | 30 days supply | Qty: 120 | Fill #0

## 2016-09-20 MED FILL — ?FUROSEMIDE 20 MG TABLET: 20 | 30 days supply | Qty: 30 | Fill #3

## 2016-09-20 MED FILL — ?LOSARTAN POTASSIUM 50 MG T: 50 MG | 30 days supply | Qty: 30 | Fill #2

## 2016-09-20 MED FILL — CYCLOBENZAPRINE 10 MG TAB: 10 | 15 days supply | Qty: 45 | Fill #0

## 2016-09-20 MED FILL — OMEPRAZOLE DR 40 MG CAPSULE: 40 | 30 days supply | Qty: 30 | Fill #0

## 2016-09-20 MED FILL — ?ESCITALOPRAM 10 MG TABLET: 10 | 30 days supply | Qty: 30 | Fill #1

## 2016-09-20 MED FILL — LABETALOL HCL 100 MG TABLET: 100 | 30 days supply | Qty: 60 | Fill #0

## 2016-09-20 MED FILL — ATORVASTATIN 40 MG TABLET: 40 | 30 days supply | Qty: 30 | Fill #1

## 2016-09-23 ENCOUNTER — Telehealth: Payer: Self-pay | Admitting: Internal Medicine

## 2016-09-23 NOTE — Telephone Encounter (Signed)
Pt called stating that she has COPD and is having trouble breathing. Located Triage nurse, informed of call, and was instructed to take a message and she will return her call.

## 2016-09-23 NOTE — Telephone Encounter (Addendum)
Pt request a steroid and ATB for COPD. She has used inhaler at home.  Does not have nebulizer sol'n. Sx's onset 3 days ago with sneezing, sinus congestion. Now has a dry cough. No apt available on schedule today. Pt informed if sx's progress, to proceed to urgent care or ED.

## 2016-09-24 ENCOUNTER — Ambulatory Visit: Payer: Self-pay | Admitting: Internal Medicine

## 2016-09-24 ENCOUNTER — Other Ambulatory Visit: Payer: Self-pay | Admitting: Internal Medicine

## 2016-09-24 MED ORDER — ALBUTEROL SULFATE HFA 108 (90 BASE) MCG/ACT IN AERS: 2.0000 | INHALATION_SPRAY | RESPIRATORY_TRACT | 5 refills | Status: DC | PRN

## 2016-09-24 MED ORDER — FLUTICASONE FUROATE-VILANTEROL 200-25 MCG/INH IN AEPB: 1.0000 | INHALATION_SPRAY | Freq: Every day | RESPIRATORY_TRACT | 5 refills | Status: DC

## 2016-09-24 MED ORDER — PREDNISONE 20 MG PO TABS: 20.0000 mg | ORAL_TABLET | Freq: Every day | ORAL | 0 refills | Status: DC

## 2016-09-24 MED ORDER — IPRATROPIUM-ALBUTEROL 0.5-2.5 (3) MG/3ML IN SOLN: 3.0000 mL | RESPIRATORY_TRACT | 3 refills | Status: DC | PRN

## 2016-09-24 MED FILL — !VENTOLIN HFA INHALER: 108 (90 BAS | 25 days supply | Qty: 18 | Fill #1

## 2016-09-24 MED FILL — !BREO ELLIPTA 200-25 MCG: 200-25 | 28 days supply | Qty: 56 | Fill #1

## 2016-09-24 MED FILL — ?PREDNISONE 20 MG TABLET: 20 | 10 days supply | Qty: 20 | Fill #0

## 2016-09-24 NOTE — Telephone Encounter (Signed)
I renewed her Breo inhaler, Albuterol mdi, duonebs, and 10 day course of steroids.  Needs to make appt w/ [place] this week - anyone ok.  If sob./doe worsens/ fevers, needs to come in asap, or go to er/urgent care. thanks

## 2016-09-24 NOTE — Telephone Encounter (Signed)
Pt aware of message, apt scheduled this week with provider on Friday.

## 2016-09-25 ENCOUNTER — Other Ambulatory Visit: Payer: Self-pay | Admitting: Family Medicine

## 2016-09-25 MED ORDER — IPRATROPIUM-ALBUTEROL 0.5-2.5 (3) MG/3ML IN SOLN: 3.0000 mL | Freq: Four times a day (QID) | RESPIRATORY_TRACT | 3 refills | Status: DC | PRN

## 2016-09-25 MED FILL — IPRAT-ALBUT 0.5-3(2.5) MG/3: 0.5-2.5 (3) | 7 days supply | Qty: 90 | Fill #0

## 2016-09-25 NOTE — Telephone Encounter (Signed)
Just filled this rx yesterday.

## 2016-09-27 ENCOUNTER — Ambulatory Visit: Payer: Self-pay

## 2016-10-01 ENCOUNTER — Ambulatory Visit: Payer: Self-pay

## 2016-10-04 ENCOUNTER — Other Ambulatory Visit: Payer: Self-pay | Admitting: Internal Medicine

## 2016-10-04 ENCOUNTER — Other Ambulatory Visit: Payer: Self-pay | Admitting: Family Medicine

## 2016-10-04 DIAGNOSIS — F419 Anxiety disorder, unspecified: Principal | ICD-10-CM

## 2016-10-04 DIAGNOSIS — F329 Major depressive disorder, single episode, unspecified: Secondary | ICD-10-CM

## 2016-10-25 ENCOUNTER — Other Ambulatory Visit: Payer: Self-pay | Admitting: Internal Medicine

## 2016-10-25 ENCOUNTER — Other Ambulatory Visit: Payer: Self-pay | Admitting: Family Medicine

## 2016-10-25 MED FILL — LOSARTAN POTASSIUM 50 MG TA: 50 | 30 days supply | Qty: 30 | Fill #0

## 2016-10-25 MED FILL — !VENTOLIN HFA INHALER: 108 (90 BAS | 16 days supply | Qty: 18 | Fill #2

## 2016-10-25 MED FILL — !BREO ELLIPTA 200-25 MCG: 200-25 | 28 days supply | Qty: 56 | Fill #2

## 2016-10-25 MED FILL — GABAPENTIN 600 MG TABLET: 600 | 30 days supply | Qty: 120 | Fill #1

## 2016-10-25 MED FILL — IPRAT-ALBUT 0.5-3(2.5) MG/3: 0.5-2.5 (3) | 7 days supply | Qty: 90 | Fill #1

## 2016-10-25 MED FILL — ?ESCITALOPRAM 10 MG TABLET: 10 | 30 days supply | Qty: 30 | Fill #2

## 2016-10-25 MED FILL — HYDROXYZINE PAM 25 MG CAP: 25 | 30 days supply | Qty: 90 | Fill #0

## 2016-10-25 MED FILL — LABETALOL HCL 100 MG TABLET: 100 | 30 days supply | Qty: 60 | Fill #1

## 2016-10-25 MED FILL — ATORVASTATIN 40 MG TABLET: 40 | 30 days supply | Qty: 30 | Fill #2

## 2016-10-25 MED FILL — MONTELUKAST SOD 10 MG TAB: 10 | 30 days supply | Qty: 30 | Fill #4

## 2016-10-25 MED FILL — OMEPRAZOLE DR 40 MG CAPSULE: 40 | 30 days supply | Qty: 30 | Fill #1

## 2016-10-28 MED FILL — ?FUROSEMIDE 20 MG TABLET: 20 | 30 days supply | Qty: 30 | Fill #0

## 2016-10-28 MED FILL — CYCLOBENZAPRINE 10 MG TAB: 10 | 15 days supply | Qty: 45 | Fill #0

## 2016-10-28 MED FILL — LEVOTHYROXINE 100 MCG TAB: 100 | 30 days supply | Qty: 30 | Fill #0

## 2016-10-31 ENCOUNTER — Ambulatory Visit: Payer: Self-pay | Attending: Internal Medicine | Admitting: Physician Assistant

## 2016-10-31 VITALS — BP 160/95 | HR 89 | Temp 99.3°F | Resp 16 | Wt 147.6 lb

## 2016-10-31 DIAGNOSIS — I252 Old myocardial infarction: Secondary | ICD-10-CM | POA: Insufficient documentation

## 2016-10-31 DIAGNOSIS — E039 Hypothyroidism, unspecified: Secondary | ICD-10-CM

## 2016-10-31 DIAGNOSIS — M1990 Unspecified osteoarthritis, unspecified site: Secondary | ICD-10-CM | POA: Insufficient documentation

## 2016-10-31 DIAGNOSIS — Z79899 Other long term (current) drug therapy: Secondary | ICD-10-CM | POA: Insufficient documentation

## 2016-10-31 DIAGNOSIS — Z91040 Latex allergy status: Secondary | ICD-10-CM | POA: Insufficient documentation

## 2016-10-31 DIAGNOSIS — G8929 Other chronic pain: Secondary | ICD-10-CM

## 2016-10-31 DIAGNOSIS — Z885 Allergy status to narcotic agent status: Secondary | ICD-10-CM | POA: Insufficient documentation

## 2016-10-31 DIAGNOSIS — F329 Major depressive disorder, single episode, unspecified: Secondary | ICD-10-CM | POA: Insufficient documentation

## 2016-10-31 DIAGNOSIS — K219 Gastro-esophageal reflux disease without esophagitis: Secondary | ICD-10-CM | POA: Insufficient documentation

## 2016-10-31 DIAGNOSIS — R739 Hyperglycemia, unspecified: Secondary | ICD-10-CM

## 2016-10-31 DIAGNOSIS — I10 Essential (primary) hypertension: Secondary | ICD-10-CM

## 2016-10-31 DIAGNOSIS — E559 Vitamin D deficiency, unspecified: Secondary | ICD-10-CM

## 2016-10-31 DIAGNOSIS — J441 Chronic obstructive pulmonary disease with (acute) exacerbation: Secondary | ICD-10-CM

## 2016-10-31 DIAGNOSIS — L409 Psoriasis, unspecified: Secondary | ICD-10-CM

## 2016-10-31 DIAGNOSIS — E785 Hyperlipidemia, unspecified: Secondary | ICD-10-CM | POA: Insufficient documentation

## 2016-10-31 DIAGNOSIS — J40 Bronchitis, not specified as acute or chronic: Secondary | ICD-10-CM

## 2016-10-31 DIAGNOSIS — E038 Other specified hypothyroidism: Secondary | ICD-10-CM

## 2016-10-31 DIAGNOSIS — M545 Low back pain: Secondary | ICD-10-CM | POA: Insufficient documentation

## 2016-10-31 DIAGNOSIS — Z7951 Long term (current) use of inhaled steroids: Secondary | ICD-10-CM | POA: Insufficient documentation

## 2016-10-31 DIAGNOSIS — I2510 Atherosclerotic heart disease of native coronary artery without angina pectoris: Secondary | ICD-10-CM | POA: Insufficient documentation

## 2016-10-31 DIAGNOSIS — G4700 Insomnia, unspecified: Secondary | ICD-10-CM | POA: Insufficient documentation

## 2016-10-31 DIAGNOSIS — F419 Anxiety disorder, unspecified: Secondary | ICD-10-CM | POA: Insufficient documentation

## 2016-10-31 MED ORDER — FUROSEMIDE 20 MG PO TABS: 20.0000 mg | ORAL_TABLET | Freq: Every day | ORAL | 2 refills | Status: DC

## 2016-10-31 MED ORDER — TRAMADOL HCL 50 MG PO TABS: 50.0000 mg | ORAL_TABLET | Freq: Two times a day (BID) | ORAL | 0 refills | Status: DC | PRN

## 2016-10-31 MED ORDER — PREDNISONE 10 MG PO TABS: ORAL_TABLET | ORAL | 0 refills | Status: DC

## 2016-10-31 MED ORDER — LEVOTHYROXINE SODIUM 100 MCG PO TABS
100.0000 ug | ORAL_TABLET | Freq: Every day | ORAL | 3 refills | Status: DC
Start: 2016-10-31 — End: 2017-03-04

## 2016-10-31 MED ORDER — CYCLOBENZAPRINE HCL 10 MG PO TABS: ORAL_TABLET | ORAL | 0 refills | Status: DC

## 2016-10-31 MED ORDER — LEVOTHYROXINE SODIUM 100 MCG PO TABS: 100.0000 ug | ORAL_TABLET | Freq: Every day | ORAL | 3 refills | Status: DC

## 2016-10-31 MED ORDER — AZITHROMYCIN 250 MG PO TABS: ORAL_TABLET | ORAL | 0 refills | Status: DC

## 2016-10-31 MED ORDER — METHYLPREDNISOLONE SODIUM SUCC 125 MG IJ SOLR
125.0000 mg | Freq: Once | INTRAMUSCULAR | Status: AC
  Administered 2016-10-31: 125 mg via INTRAMUSCULAR

## 2016-10-31 MED FILL — AZITHROMYCIN 250 MG TABLET: 250 | 5 days supply | Qty: 6 | Fill #0

## 2016-10-31 MED FILL — ?PREDNISONE 10 MG TABLET: 10 | 6 days supply | Qty: 21 | Fill #0

## 2016-10-31 MED FILL — ?CYCLOBENZAPRINE 10 MG TABL: 10 | 15 days supply | Qty: 45 | Fill #0

## 2016-10-31 NOTE — Progress Notes (Signed)
[name] [name], is a 61 y.o. female  GXQ:[id number]  XKG:[id number]  DOB - 10-Aug-1955  Subjective:  Chief Complaint and HPI: [name] [name] is a 61 y.o. female here today for 5-6 day h/o SOB/wheezing and cough.  She denies f/c.  SOB is helped with inhalers and home breathing treatments.  cough is productive of yellow/green phlegm.  No hemoptysis.    Says she is compliant on all of her medications.  She needs RF of flexeril and tramadol(last tramadol Rx printed in February 2018 with no RF) for chronic LBP, synthroid, and lasix.  Also c/o rash on fingers and arms that she has been diagnosed with psoriasis in the past and is asking for a referral to dermatology.  ROS:   Constitutional:  No f/c, No night sweats, No unexplained weight loss. EENT:  No vision changes, No blurry vision, No hearing changes. No mouth, throat, or ear problems.  Respiratory: + cough, + SOB, +wheezing Cardiac: No CP, no palpitations GI:  No abd pain, No N/V/D. GU: No Urinary s/sx Musculoskeletal: No joint pain Neuro: No headache, no dizziness, no motor weakness.  Skin: + rash Endocrine:  No polydipsia. No polyuria.  Psych: Denies SI/HI  No problems updated.  ALLERGIES: Allergies  Allergen Reactions  . Other Anaphylaxis    avacdos  . Codeine Nausea Only  . Latex     REACTION: shortness of breath and rash    PAST MEDICAL HISTORY: Past Medical History:  Diagnosis Date  . Anxiety    takes Lexapro daily  . Arthritis   . Asthma   . Bartholin gland cyst 08/29/2011  . Bronchitis    couple of months ago  . Bruises easily    pt is on Effient  . Chronic back pain    herniated nucleus pulposus  . COPD (chronic obstructive pulmonary disease) ([place])    early stages  . Coronary artery disease   . Depression    takes Klonopin daily  . Diverticulosis   . GERD (gastroesophageal reflux disease)    takes Nexium daily  . H/O hiatal hernia   . Heart attack ([place]) 2011  . Hemorrhoids   . Hernia   .  Hyperlipidemia    takes Lipitor daily  . Hypertension    takes Losartan daily and Labetalol bid  . Hypothyroidism    takes Synthroid daily  . Insomnia    hydroxyzine prn  . Joint pain   . Neck pain   . Psoriasis    elbows,knees,back  . Shortness of breath    with exertion  . Slowing of urinary stream   . Thyroid disease     MEDICATIONS AT HOME: Prior to Admission medications   Medication Sig Start Date End Date Taking? Authorizing Provider  albuterol (PROVENTIL HFA;VENTOLIN HFA) 108 (90 Base) MCG/ACT inhaler Inhale 2 puffs into the lungs every 4 (four) hours as needed for wheezing or shortness of breath. 09/24/16 10/24/16  Maren Reamer, MD  atorvastatin (LIPITOR) 40 MG tablet Take 1 tablet (40 mg total) by mouth daily. 08/21/16 02/17/17  Clent Demark, PA-C  azithromycin [facility]) 250 MG tablet Take 2 today, then 1 tab days 2-5 10/31/16   Argentina Donovan, PA-C  Calcium 600 MG tablet Take 600 mg by mouth daily.  07/14/13   Patrecia Pour, NP  Cholecalciferol (VITAMIN D) 2000 units tablet Take 1 tablet (2,000 Units total) by mouth daily. 08/21/16 11/19/16  Clent Demark, PA-C  clotrimazole (LOTRIMIN) 1 % cream Apply 1 application  topically 2 (two) times daily. apply to tips of fingers 06/18/16   Arnoldo Morale, MD  cyclobenzaprine (FLEXERIL) 10 MG tablet TAKE 1 TABLET BY MOUTH 3 TIMES DAILY AS NEEDED FOR MUSCLE SPASMS. 10/31/16   Argentina Donovan, PA-C  escitalopram (LEXAPRO) 10 MG tablet Take 1 tablet (10 mg total) by mouth daily. 08/21/16 02/17/17  Clent Demark, PA-C  fluocinolone (VANOS) 0.01 % cream Apply topically 2 (two) times daily. 06/18/16   Arnoldo Morale, MD  fluticasone (FLONASE) 50 MCG/ACT nasal spray Place 2 sprays into both nostrils at bedtime. 04/16/16   Maren Reamer, MD  fluticasone furoate-vilanterol (BREO ELLIPTA) 200-25 MCG/INH AEPB Inhale 1 puff into the lungs daily. 09/24/16   Maren Reamer, MD  furosemide (LASIX) 20 MG tablet Take 1 tablet (20 mg total) by  mouth daily. 10/31/16   Argentina Donovan, PA-C  gabapentin (NEURONTIN) 600 MG tablet TAKE 1 TABLET BY MOUTH 4 TIMES DAILY 07/22/16   Tresa Garter, MD  hydrOXYzine (VISTARIL) 25 MG capsule TAKE 1 CAPSULE BY MOUTH 3 TIMES DAILY AS NEEDED. 10/04/16   Arnoldo Morale, MD  ipratropium-albuterol (DUONEB) 0.5-2.5 (3) MG/3ML SOLN Take 3 mLs by nebulization every 6 (six) hours as needed. 09/25/16   Arnoldo Morale, MD  labetalol (NORMODYNE) 100 MG tablet TAKE 1 TABLET BY MOUTH 2 TIMES DAILY. 09/20/16   Maren Reamer, MD  levothyroxine (SYNTHROID, LEVOTHROID) 100 MCG tablet Take 1 tablet (100 mcg total) by mouth daily. 10/31/16   Argentina Donovan, PA-C  losartan (COZAAR) 50 MG tablet Take 1 tablet (50 mg total) by mouth daily. 08/21/16 11/19/16  Clent Demark, PA-C  montelukast (SINGULAIR) 10 MG tablet Take 1 tablet (10 mg total) by mouth at bedtime. 08/21/16   Roger Roderic Ovens, PA-C  nicotine (NICODERM CQ - DOSED IN MG/24 HOURS) 14 mg/24hr patch Place 1 patch (14 mg total) onto the skin daily. Patient not taking: Reported on 08/05/2016 06/18/16   Arnoldo Morale, MD  nicotine (NICODERM CQ - DOSED IN MG/24 HOURS) 21 mg/24hr patch Place 1 patch (21 mg total) onto the skin daily. Patient not taking: Reported on 08/05/2016 11/06/15   Donita Brooks, NP  omeprazole (PRILOSEC) 40 MG capsule TAKE 1 CAPSULE BY MOUTH ONCE DAILY 09/20/16   Arnoldo Morale, MD  prasugrel (EFFIENT) 10 MG TABS tablet Take 1 tablet (10 mg total) by mouth daily. 07/14/13   Patrecia Pour, NP  predniSONE (DELTASONE) 10 MG tablet 6,5,4,3,2,1 take each days dose in am with food 10/31/16   Argentina Donovan, PA-C  sodium chloride (OCEAN) 0.65 % nasal spray Place 1 spray into the nose as needed for congestion. 08/05/16   Roger Roderic Ovens, PA-C  traMADol (ULTRAM) 50 MG tablet Take 1 tablet (50 mg total) by mouth every 12 (twelve) hours as needed. 10/31/16   Argentina Donovan, PA-C  Vitamin D, Ergocalciferol, (DRISDOL) 50000 units CAPS capsule TAKE 1 CAPSULE BY MOUTH  EVERY 7 DAYS. Patient not taking: Reported on 10/31/2016 06/29/16   Maren Reamer, MD     Objective:  EXAM:   Vitals:   10/31/16 1519 10/31/16 1531  BP: (!) 160/95   Pulse: 89   Resp: 16   Temp: 99.3 F (37.4 C)   TempSrc: Oral   SpO2: (!) 89% 94%  Weight: 147 lb 9.6 oz (67 kg)    Patient given priority over other patients and breathing treatment started immediately during VS assessment. General appearance : A&OX3. NAD. Non-toxic-appearing, prior to breathing  treatment, she is coughing and appears to give out of breath easily.  +Improvement after breathing treatment HEENT: Atraumatic and Normocephalic.  PERRLA. EOM intact.  TM clear B. Mouth-MMM, post pharynx WNL w/o erythema, No PND. Neck: supple, no JVD. No cervical lymphadenopathy. No thyromegaly Chest/Lungs:  Breathing mildly labored with fair air movement throughout.  There is diffuse moderate wheezing.  No rales or rhonchi CVS: S1 S2 regular, no murmurs, gallops, rubs  Extremities: Bilateral Lower Ext shows no edema, both legs are warm to touch with = pulse throughout Neurology:  CN II-XII grossly intact, Non focal.   Psych:  Normal speech with scratchy voice. Appropriate eye contact and affect.  Skin:  Clubbing of fingers with dry, cracking skin present.  She does have a few areas of plaques on B elbows that do appear suspicious of psoriasis.  Data Review Lab Results  Component Value Date   HGBA1C 6.0 06/22/2015   HGBA1C 5.60 08/09/2014     Assessment & Plan   1. Essential hypertension, benign Uncontrolled.  Continue current regimens and check BP as she starts to feel better/breathing improves. - Comprehensive metabolic panel - CBC with Differential/Platelet RF- furosemide (LASIX) 20 MG tablet; Take 1 tablet (20 mg total) by mouth daily.  Dispense: 30 tablet; Refill: 2  2. COPD exacerbation (HCC) Pulse ox improved from 89% to 94% after albuterol/atrovent - Comprehensive metabolic panel - CBC with  Differential/Platelet - methylPREDNISolone sodium succinate (SOLU-MEDROL) 125 mg/2 mL injection 125 mg; Inject 2 mLs (125 mg total) into the muscle once. - predniSONE (DELTASONE) 10 MG tablet; 6,5,4,3,2,1 take each days dose in am with food  Dispense: 21 tablet; Refill: 0  3. Hypothyroidism, unspecified type - TSH - levothyroxine (SYNTHROID, LEVOTHROID) 100 MCG tablet; Take 1 tablet (100 mcg total) by mouth daily.  Dispense: 30 tablet; Refill: 3  4. Bronchitis - CBC with Differential/Platelet - methylPREDNISolone sodium succinate (SOLU-MEDROL) 125 mg/2 mL injection 125 mg; Inject 2 mLs (125 mg total) into the muscle once. - azithromycin (ZITHROMAX) 250 MG tablet; Take 2 today, then 1 tab days 2-5  Dispense: 6 tablet; Refill: 0 - predniSONE (DELTASONE) 10 MG tablet; 6,5,4,3,2,1 take each days dose in am with food  Dispense: 21 tablet; Refill: 0  5. Chronic low back pain, unspecified back pain laterality, with sciatica presence unspecified - traMADol (ULTRAM) 50 MG tablet; Take 1 tablet (50 mg total) by mouth every 12 (twelve) hours as needed.  Dispense: 60 tablet; Refill: 0 - cyclobenzaprine (FLEXERIL) 10 MG tablet; TAKE 1 TABLET BY MOUTH 3 TIMES DAILY AS NEEDED FOR MUSCLE SPASMS.  Dispense: 45 tablet; Refill: 0  6. Vitamin D deficiency - Vitamin D, 25-hydroxy  7. ?[facility].   Patient have been counseled extensively about nutrition and exercise  Return in about 3 weeks (around 11/21/2016) for f/up Dr Jarold Song for htn/COPD.  The patient was given clear instructions to go to ER or return to medical center if symptoms don't improve, worsen or new problems develop. The patient verbalized understanding. The patient was told to call to get lab results if they haven't heard anything in the next week.     Freeman Caldron, PA-C [facility] [address] [facility], [place]   10/31/2016, 3:56 PMPatient ID: [name], female   DOB: 22-Dec-1955, 61  y.o.   MRN: [id number]

## 2016-11-01 ENCOUNTER — Other Ambulatory Visit: Payer: Self-pay | Admitting: Physician Assistant

## 2016-11-01 DIAGNOSIS — E038 Other specified hypothyroidism: Secondary | ICD-10-CM

## 2016-11-01 DIAGNOSIS — R739 Hyperglycemia, unspecified: Secondary | ICD-10-CM

## 2016-11-01 LAB — CBC WITH DIFFERENTIAL/PLATELET
BASOS: 0 %
Basophils Absolute: 0 10*3/uL (ref 0.0–0.2)
EOS (ABSOLUTE): 0 10*3/uL (ref 0.0–0.4)
EOS: 0 %
HEMATOCRIT: 44 % (ref 34.0–46.6)
Hemoglobin: 14.5 g/dL (ref 11.1–15.9)
Immature Grans (Abs): 0 10*3/uL (ref 0.0–0.1)
Immature Granulocytes: 0 %
LYMPHS ABS: 0.7 10*3/uL (ref 0.7–3.1)
Lymphs: 10 %
MCH: 29.7 pg (ref 26.6–33.0)
MCHC: 33 g/dL (ref 31.5–35.7)
MCV: 90 fL (ref 79–97)
MONOCYTES: 2 %
Monocytes Absolute: 0.1 10*3/uL (ref 0.1–0.9)
NEUTROS ABS: 6.9 10*3/uL (ref 1.4–7.0)
Neutrophils: 88 %
PLATELETS: 500 10*3/uL — AB (ref 150–379)
RBC: 4.88 x10E6/uL (ref 3.77–5.28)
RDW: 14.8 % (ref 12.3–15.4)
WBC: 7.8 10*3/uL (ref 3.4–10.8)

## 2016-11-01 LAB — COMPREHENSIVE METABOLIC PANEL
ALT: 16 IU/L (ref 0–32)
AST: 18 IU/L (ref 0–40)
Albumin/Globulin Ratio: 1.8 (ref 1.2–2.2)
Albumin: 4.6 g/dL (ref 3.6–4.8)
Alkaline Phosphatase: 146 IU/L — ABNORMAL HIGH (ref 39–117)
BUN / CREAT RATIO: 14 (ref 12–28)
BUN: 14 mg/dL (ref 8–27)
Bilirubin Total: 0.3 mg/dL (ref 0.0–1.2)
CO2: 24 mmol/L (ref 18–29)
CREATININE: 1 mg/dL (ref 0.57–1.00)
Calcium: 9.7 mg/dL (ref 8.7–10.3)
Chloride: 97 mmol/L (ref 96–106)
GFR calc non Af Amer: 61 mL/min/{1.73_m2} (ref >59)
GFR, EST AFRICAN AMERICAN: 71 mL/min/{1.73_m2} (ref >59)
GLOBULIN, TOTAL: 2.6 g/dL (ref 1.5–4.5)
GLUCOSE: 168 mg/dL — AB (ref 65–99)
Potassium: 4.5 mmol/L (ref 3.5–5.2)
Sodium: 142 mmol/L (ref 134–144)
Total Protein: 7.2 g/dL (ref 6.0–8.5)

## 2016-11-01 LAB — TSH: TSH: 0.386 u[IU]/mL — ABNORMAL LOW (ref 0.450–4.500)

## 2016-11-01 LAB — VITAMIN D 25 HYDROXY (VIT D DEFICIENCY, FRACTURES): Vit D, 25-Hydroxy: 28 ng/mL — ABNORMAL LOW (ref 30.0–100.0)

## 2016-11-01 NOTE — Addendum Note (Signed)
Addended by: Westley Gambles A on: 11/01/2016 10:35 AM   Modules accepted: Orders

## 2016-11-26 ENCOUNTER — Ambulatory Visit: Payer: Self-pay | Admitting: Family Medicine

## 2016-11-26 ENCOUNTER — Other Ambulatory Visit: Payer: Self-pay | Admitting: Physician Assistant

## 2016-11-26 ENCOUNTER — Other Ambulatory Visit: Payer: Self-pay | Admitting: Family Medicine

## 2016-11-26 DIAGNOSIS — M545 Low back pain: Principal | ICD-10-CM

## 2016-11-26 DIAGNOSIS — F329 Major depressive disorder, single episode, unspecified: Secondary | ICD-10-CM

## 2016-11-26 DIAGNOSIS — F419 Anxiety disorder, unspecified: Principal | ICD-10-CM

## 2016-11-26 DIAGNOSIS — G8929 Other chronic pain: Secondary | ICD-10-CM

## 2016-11-26 MED FILL — GABAPENTIN 600 MG TABLET: 600 | 30 days supply | Qty: 120 | Fill #2

## 2016-11-26 MED FILL — HYDROXYZINE PAM 25 MG CAP: 25 | 30 days supply | Qty: 90 | Fill #0

## 2016-11-26 MED FILL — LABETALOL HCL 100 MG TABLET: 100 | 30 days supply | Qty: 60 | Fill #2

## 2016-11-26 MED FILL — CYCLOBENZAPRINE 10 MG TAB: 10 | 15 days supply | Qty: 45 | Fill #0

## 2016-11-26 MED FILL — LOSARTAN POTASSIUM 50 MG TA: 50 | 30 days supply | Qty: 30 | Fill #1

## 2016-11-26 MED FILL — FUROSEMIDE 20 MG TABLET: 20 | 30 days supply | Qty: 30 | Fill #0

## 2016-11-26 MED FILL — IPRAT-ALBUT 0.5-3(2.5) MG/3: 0.5-2.5 (3) | 7 days supply | Qty: 90 | Fill #2

## 2016-11-26 MED FILL — ?ESCITALOPRAM 10 MG TABLET: 10 | 30 days supply | Qty: 30 | Fill #3

## 2016-11-26 MED FILL — MONTELUKAST SOD 10 MG TAB: 10 | 30 days supply | Qty: 30 | Fill #5

## 2016-11-26 MED FILL — ATORVASTATIN 40 MG TABLET: 40 | 30 days supply | Qty: 30 | Fill #0

## 2016-11-26 MED FILL — OMEPRAZOLE DR 40 MG CAPSULE: 40 | 30 days supply | Qty: 30 | Fill #2

## 2016-11-26 MED FILL — LEVOTHYROXINE 100 MCG TAB: 100 | 30 days supply | Qty: 30 | Fill #1

## 2016-11-26 MED FILL — BREO ELLIPTA 200-25 MCG INH: 200-25 | 30 days supply | Qty: 60 | Fill #3

## 2016-11-27 MED FILL — $VENTOLIN HFA 18G INHALER: 108 (90 BAS | 25 days supply | Qty: 18 | Fill #3

## 2016-11-28 ENCOUNTER — Encounter: Payer: Self-pay | Admitting: Internal Medicine

## 2016-11-29 ENCOUNTER — Encounter: Payer: Self-pay | Admitting: Internal Medicine

## 2016-12-02 ENCOUNTER — Ambulatory Visit: Payer: Self-pay | Admitting: Family Medicine

## 2016-12-02 ENCOUNTER — Encounter: Payer: Self-pay | Admitting: Internal Medicine

## 2016-12-23 ENCOUNTER — Other Ambulatory Visit: Payer: Self-pay | Admitting: Family Medicine

## 2016-12-23 DIAGNOSIS — K219 Gastro-esophageal reflux disease without esophagitis: Secondary | ICD-10-CM

## 2016-12-23 MED FILL — IPRAT-ALBUT 0.5-3(2.5) MG/3: 0.5-2.5 (3) | 7 days supply | Qty: 90 | Fill #3

## 2016-12-23 MED FILL — ATORVASTATIN 40 MG TABLET: 40 | 30 days supply | Qty: 30 | Fill #1

## 2016-12-23 MED FILL — ?LEVOTHYROXINE 100 MCG TAB: 100 | 30 days supply | Qty: 30 | Fill #2

## 2016-12-23 MED FILL — ?ESCITALOPRAM 10 MG TABLET: 10 | 30 days supply | Qty: 30 | Fill #0

## 2016-12-23 MED FILL — LOSARTAN POTASSIUM 50 MG TA: 50 | 30 days supply | Qty: 30 | Fill #2

## 2016-12-23 MED FILL — CYCLOBENZAPRINE 10 MG TAB: 10 | 15 days supply | Qty: 45 | Fill #1

## 2016-12-23 MED FILL — FUROSEMIDE 20 MG TABLET: 20 | 30 days supply | Qty: 30 | Fill #1

## 2016-12-23 MED FILL — GABAPENTIN 600 MG TABLET: 600 | 30 days supply | Qty: 120 | Fill #3

## 2016-12-23 MED FILL — ?MONTELUKAST SOD 10 MG TAB: 10 | 30 days supply | Qty: 30 | Fill #6

## 2016-12-23 MED FILL — $VENTOLIN HFA 18G INHALER: 108 (90 BAS | 25 days supply | Qty: 18 | Fill #4

## 2016-12-24 MED FILL — !BREO ELLIPTA 200-25 MCG: 200-25 | 30 days supply | Qty: 60 | Fill #4

## 2017-01-28 ENCOUNTER — Other Ambulatory Visit: Payer: Self-pay | Admitting: Physician Assistant

## 2017-01-28 ENCOUNTER — Other Ambulatory Visit: Payer: Self-pay | Admitting: Family Medicine

## 2017-01-28 DIAGNOSIS — I10 Essential (primary) hypertension: Secondary | ICD-10-CM

## 2017-01-28 MED FILL — MONTELUKAST SOD 10 MG TAB: 10 | 30 days supply | Qty: 30 | Fill #7

## 2017-01-28 MED FILL — $VENTOLIN HFA 18G INHALER: 108 (90 BAS | 16 days supply | Qty: 18 | Fill #5

## 2017-01-28 MED FILL — ESCITALOPRAM 10 MG TABLET: 10 | 30 days supply | Qty: 30 | Fill #1

## 2017-01-28 MED FILL — ?CYCLOBENZAPRINE 10 MG TABL: 10 | 15 days supply | Qty: 45 | Fill #2

## 2017-01-28 MED FILL — ?ATORVASTATIN 40MG TABLET: 40 | 30 days supply | Qty: 30 | Fill #2

## 2017-01-28 MED FILL — ?FUROSEMIDE 20 MG TABLET: 20 | 30 days supply | Qty: 30 | Fill #2

## 2017-01-28 MED FILL — !BREO ELLIPTA 200-25 MCG: 200-25 | 30 days supply | Qty: 60 | Fill #5

## 2017-01-28 NOTE — Telephone Encounter (Signed)
FWD to PCP. Jenness Stemler S Garren Greenman, CMA  

## 2017-01-30 MED FILL — LOSARTAN POTASSIUM 50 MG TA: 50 | 30 days supply | Qty: 30 | Fill #0

## 2017-02-07 ENCOUNTER — Ambulatory Visit (HOSPITAL_COMMUNITY)
Admission: EM | Admit: 2017-02-07 | Discharge: 2017-02-07 | Disposition: A | Discharge status: Home / Self Care | Payer: Self-pay | Attending: Family Medicine | Admitting: Family Medicine

## 2017-02-07 ENCOUNTER — Other Ambulatory Visit: Payer: Self-pay | Admitting: Pharmacist

## 2017-02-07 ENCOUNTER — Encounter (HOSPITAL_COMMUNITY): Payer: Self-pay | Admitting: Family Medicine

## 2017-02-07 DIAGNOSIS — J441 Chronic obstructive pulmonary disease with (acute) exacerbation: Secondary | ICD-10-CM

## 2017-02-07 DIAGNOSIS — R0981 Nasal congestion: Secondary | ICD-10-CM

## 2017-02-07 DIAGNOSIS — F329 Major depressive disorder, single episode, unspecified: Secondary | ICD-10-CM

## 2017-02-07 DIAGNOSIS — R062 Wheezing: Secondary | ICD-10-CM

## 2017-02-07 DIAGNOSIS — R059 Cough, unspecified: Secondary | ICD-10-CM

## 2017-02-07 DIAGNOSIS — J209 Acute bronchitis, unspecified: Secondary | ICD-10-CM

## 2017-02-07 DIAGNOSIS — R05 Cough: Secondary | ICD-10-CM

## 2017-02-07 DIAGNOSIS — F419 Anxiety disorder, unspecified: Principal | ICD-10-CM

## 2017-02-07 MED ORDER — PREDNISONE 10 MG PO TABS: ORAL_TABLET | ORAL | 0 refills | Status: DC

## 2017-02-07 MED ORDER — LABETALOL HCL 100 MG PO TABS
100.0000 mg | ORAL_TABLET | Freq: Two times a day (BID) | ORAL | 0 refills | Status: DC
Start: 2017-02-07 — End: 2017-03-18

## 2017-02-07 MED ORDER — METHYLPREDNISOLONE SODIUM SUCC 125 MG IJ SOLR
INTRAMUSCULAR | Status: AC
  Filled 2017-02-07: qty 2

## 2017-02-07 MED ORDER — HYDROXYZINE PAMOATE 25 MG PO CAPS: ORAL_CAPSULE | ORAL | 0 refills | Status: DC

## 2017-02-07 MED ORDER — METHYLPREDNISOLONE SODIUM SUCC 125 MG IJ SOLR
125.0000 mg | Freq: Once | INTRAMUSCULAR | Status: AC
  Administered 2017-02-07: 125 mg via INTRAMUSCULAR

## 2017-02-07 MED ORDER — BENZONATATE 100 MG PO CAPS: 200.0000 mg | ORAL_CAPSULE | Freq: Three times a day (TID) | ORAL | 0 refills | Status: DC | PRN

## 2017-02-07 MED ORDER — AMOXICILLIN 875 MG PO TABS: 875.0000 mg | ORAL_TABLET | Freq: Two times a day (BID) | ORAL | 0 refills | Status: DC

## 2017-02-07 MED FILL — LABETALOL HCL 100 MG TABLET: 100 | 30 days supply | Qty: 60 | Fill #0

## 2017-02-07 MED FILL — HYDROXYZINE PAM 25 MG CAP: 25 | 30 days supply | Qty: 90 | Fill #0

## 2017-02-07 NOTE — ED Provider Notes (Signed)
CSN: [id number]     Arrival date & time 02/07/17  1526 History   None    Chief Complaint  Patient presents with  . Cough   (Consider location/radiation/quality/duration/timing/severity/associated sxs/prior Treatment) Patient c/o cough, chest congestion, and she has hx of moderate to severe COPD.     The history is provided by the patient.  Cough  Cough characteristics:  Productive Sputum characteristics:  White Severity:  Moderate Onset quality:  Sudden Duration:  5 days Timing:  Constant Chronicity:  New Relieved by:  Nothing Worsened by:  Nothing Associated symptoms: wheezing     Past Medical History:  Diagnosis Date  . Anxiety    takes Lexapro daily  . Arthritis   . Asthma   . Bartholin gland cyst 08/29/2011  . Bronchitis    couple of months ago  . Bruises easily    pt is on Effient  . Chronic back pain    herniated nucleus pulposus  . COPD (chronic obstructive pulmonary disease) ([place])    early stages  . Coronary artery disease   . Depression    takes Klonopin daily  . Diverticulosis   . GERD (gastroesophageal reflux disease)    takes Nexium daily  . H/O hiatal hernia   . Heart attack ([place]) 2011  . Hemorrhoids   . Hernia   . Hyperlipidemia    takes Lipitor daily  . Hypertension    takes Losartan daily and Labetalol bid  . Hypothyroidism    takes Synthroid daily  . Insomnia    hydroxyzine prn  . Joint pain   . Neck pain   . Psoriasis    elbows,knees,back  . Shortness of breath    with exertion  . Slowing of urinary stream   . Thyroid disease    Past Surgical History:  Procedure Laterality Date  . ABDOMINAL HYSTERECTOMY  1977  . COLONOSCOPY    . CORONARY ANGIOPLASTY WITH STENT PLACEMENT  2011   x 2  . esophagogastroduodenscoy    . LAPAROSCOPY  1977   exploratory  . LUMBAR LAMINECTOMY/DECOMPRESSION MICRODISCECTOMY  08/05/2011   Procedure: LUMBAR LAMINECTOMY/DECOMPRESSION MICRODISCECTOMY;  Surgeon: Otilio Connors, MD;  Location: [facility];   Service: Neurosurgery;  Laterality: Right;  Right Lumbar four-five extraforaminal discectomy  . TONSILLECTOMY     as a child   Family History  Problem Relation Age of Onset  . Heart disease Mother   . Hypertension Mother   . Stroke Mother   . Heart disease Father   . Hypertension Father   . Diabetes Father   . Cancer Maternal Aunt        breast  . Thyroid disease Paternal Aunt   . Heart disease Maternal Grandfather   . Anesthesia problems Daughter   . Cancer Paternal Grandmother        mouth  . Hypotension Neg Hx   . Malignant hyperthermia Neg Hx   . Pseudochol deficiency Neg Hx    Social History  Substance Use Topics  . Smoking status: Current Every Day Smoker    Packs/day: 1.00    Years: 44.00    Types: Cigarettes  . Smokeless tobacco: Never Used  . Alcohol use Not on file   OB History    Gravida Para Term Preterm AB Living   1 1 1  0 0 1   SAB TAB Ectopic Multiple Live Births   0 0 0 0       Review of Systems  Constitutional: Negative.   HENT:  Negative.   Eyes: Negative.   Respiratory: Positive for cough and wheezing.   Cardiovascular: Negative.   Gastrointestinal: Negative.   Endocrine: Negative.   Genitourinary: Negative.   Musculoskeletal: Negative.   Allergic/Immunologic: Negative.   Neurological: Negative.   Hematological: Negative.   Psychiatric/Behavioral: Negative.     Allergies  Other; Codeine; and Latex  Home Medications   Prior to Admission medications   Medication Sig Start Date End Date Taking? Authorizing Provider  albuterol (PROVENTIL HFA;VENTOLIN HFA) 108 (90 Base) MCG/ACT inhaler Inhale 2 puffs into the lungs every 4 (four) hours as needed for wheezing or shortness of breath. 09/24/16 10/24/16  Maren Reamer, MD  amoxicillin (AMOXIL) 875 MG tablet Take 1 tablet (875 mg total) by mouth 2 (two) times daily. 02/07/17   Lysbeth Penner, FNP  atorvastatin (LIPITOR) 40 MG tablet Take 1 tablet (40 mg total) by mouth daily. 08/21/16 02/17/17   Clent Demark, PA-C  azithromycin [facility]) 250 MG tablet Take 2 today, then 1 tab days 2-5 10/31/16   Argentina Donovan, PA-C  benzonatate (TESSALON) 100 MG capsule Take 2 capsules (200 mg total) by mouth 3 (three) times daily as needed for cough. 02/07/17   Lysbeth Penner, FNP  BREO ELLIPTA 200-25 MCG/INH AEPB INHALE 1 PUFF INTO THE LUNGS DAILY. 01/30/17   Clent Demark, PA-C  Calcium 600 MG tablet Take 600 mg by mouth daily.  07/14/13   Patrecia Pour, NP  clotrimazole (LOTRIMIN) 1 % cream Apply 1 application topically 2 (two) times daily. apply to tips of fingers 06/18/16   Arnoldo Morale, MD  cyclobenzaprine (FLEXERIL) 10 MG tablet TAKE 1 TABLET BY MOUTH 3 TIMES DAILY AS NEEDED FOR MUSCLE SPASMS. 11/26/16   Langeland, Leda Quail, MD  escitalopram (LEXAPRO) 10 MG tablet Take 1 tablet (10 mg total) by mouth daily. 08/21/16 02/17/17  Clent Demark, PA-C  fluocinolone (VANOS) 0.01 % cream Apply topically 2 (two) times daily. 06/18/16   Arnoldo Morale, MD  fluticasone (FLONASE) 50 MCG/ACT nasal spray Place 2 sprays into both nostrils at bedtime. 04/16/16   Langeland, Dawn T, MD  fluticasone furoate-vilanterol (BREO ELLIPTA) 200-25 MCG/INH AEPB Inhale 1 puff into the lungs daily. 09/24/16   Maren Reamer, MD  furosemide (LASIX) 20 MG tablet Take 1 tablet (20 mg total) by mouth daily. 10/31/16   Argentina Donovan, PA-C  gabapentin (NEURONTIN) 600 MG tablet TAKE 1 TABLET BY MOUTH 4 TIMES DAILY 07/22/16   Tresa Garter, MD  hydrOXYzine (VISTARIL) 25 MG capsule TAKE 1 CAPSULE BY MOUTH 3 TIMES DAILY AS NEEDED. 02/07/17   Tresa Garter, MD  ipratropium-albuterol (DUONEB) 0.5-2.5 (3) MG/3ML SOLN Take 3 mLs by nebulization every 6 (six) hours as needed. 09/25/16   Arnoldo Morale, MD  labetalol (NORMODYNE) 100 MG tablet Take 1 tablet (100 mg total) by mouth 2 (two) times daily. 02/07/17   Tresa Garter, MD  levothyroxine (SYNTHROID, LEVOTHROID) 100 MCG tablet Take 1 tablet (100 mcg total)  by mouth daily. 10/31/16   Argentina Donovan, PA-C  losartan (COZAAR) 50 MG tablet TAKE 1 TABLET BY MOUTH DAILY 01/30/17   Clent Demark, PA-C  montelukast (SINGULAIR) 10 MG tablet Take 1 tablet (10 mg total) by mouth at bedtime. 08/21/16   Clent Demark, PA-C  nicotine (NICODERM CQ - DOSED IN MG/24 HOURS) 14 mg/24hr patch Place 1 patch (14 mg total) onto the skin daily. Patient not taking: Reported on 08/05/2016 06/18/16   Arnoldo Morale,  MD  nicotine (NICODERM CQ - DOSED IN MG/24 HOURS) 21 mg/24hr patch Place 1 patch (21 mg total) onto the skin daily. Patient not taking: Reported on 08/05/2016 11/06/15   Donita Brooks, NP  omeprazole (PRILOSEC) 40 MG capsule TAKE 1 CAPSULE BY MOUTH ONCE DAILY 09/20/16   Arnoldo Morale, MD  prasugrel (EFFIENT) 10 MG TABS tablet Take 1 tablet (10 mg total) by mouth daily. 07/14/13   Patrecia Pour, NP  predniSONE (DELTASONE) 10 MG tablet 6,5,4,3,2,1 take each days dose in am with food 10/31/16   Freeman Caldron M, PA-C  predniSONE (DELTASONE) 10 MG tablet Take 4 po qd x 3d then 3po qd x3d then 2po qd x 3d then 1po qd x 3d then stop 02/07/17   Lysbeth Penner, FNP  sodium chloride (OCEAN) 0.65 % nasal spray Place 1 spray into the nose as needed for congestion. 08/05/16   Clent Demark, PA-C  traMADol (ULTRAM) 50 MG tablet Take 1 tablet (50 mg total) by mouth every 12 (twelve) hours as needed. 10/31/16   Argentina Donovan, PA-C  VENTOLIN HFA 108 (90 Base) MCG/ACT inhaler INHALE 2 PUFFS INTO THE LUNGS EVERY 4 HOURS AS NEEDED FOR WHEEZING OR SHORTNESS OF BREATH. 01/30/17   Clent Demark, PA-C  Vitamin D, Ergocalciferol, (DRISDOL) 50000 units CAPS capsule TAKE 1 CAPSULE BY MOUTH EVERY 7 DAYS. Patient not taking: Reported on 10/31/2016 06/29/16   Maren Reamer, MD   Meds Ordered and Administered this Visit   Medications  methylPREDNISolone sodium succinate (SOLU-MEDROL) 125 mg/2 mL injection 125 mg (not administered)    BP (!) 168/84   Pulse (!) 105    Temp 99.3 F (37.4 C) (Oral)   Resp 18   SpO2 93%  No data found.   Physical Exam  Constitutional: She is oriented to person, place, and time. She appears well-developed and well-nourished.  HENT:  Head: Normocephalic and atraumatic.  Right Ear: External ear normal.  Left Ear: External ear normal.  Mouth/Throat: Oropharynx is clear and moist.  Eyes: Pupils are equal, round, and reactive to light. Conjunctivae and EOM are normal.  Neck: Normal range of motion. Neck supple.  Cardiovascular: Normal rate, regular rhythm and normal heart sounds.   Pulmonary/Chest: Effort normal. She has wheezes.  Abdominal: Soft. Bowel sounds are normal.  Neurological: She is alert and oriented to person, place, and time.  Nursing note and vitals reviewed.   Urgent Care Course     Procedures (including critical care time)  Labs Review Labs Reviewed - No data to display  Imaging Review No results found.   Visual Acuity Review  Right Eye Distance:   Left Eye Distance:   Bilateral Distance:    Right Eye Near:   Left Eye Near:    Bilateral Near:         MDM   1. COPD exacerbation ([place])   2. Cough   3. Acute bronchitis, unspecified organism    Continue neb treatments  Solumedrol 125mg  IM Amoxicillin 875mg  one po bid x 10 days Prednisone Taper 10mg  4x3, 3x3, 2x3, 1x3 then stop Take Mucinex otc  Push po fluids, rest, tylenol and motrin otc prn as directed for fever, arthralgias, and myalgias.  Follow up prn if sx's continue or persist.    Lysbeth Penner, FNP 02/07/17 [phone]

## 2017-02-07 NOTE — ED Triage Notes (Signed)
Pt here for cough and COPD exacerbation.

## 2017-03-04 ENCOUNTER — Other Ambulatory Visit: Payer: Self-pay | Admitting: Physician Assistant

## 2017-03-04 DIAGNOSIS — E039 Hypothyroidism, unspecified: Secondary | ICD-10-CM

## 2017-03-04 DIAGNOSIS — I10 Essential (primary) hypertension: Secondary | ICD-10-CM

## 2017-03-04 MED ORDER — LEVOTHYROXINE SODIUM 100 MCG PO TABS: 100.0000 ug | ORAL_TABLET | Freq: Every day | ORAL | 0 refills | Status: DC

## 2017-03-04 MED FILL — ?FUROSEMIDE 20 MG TABLET: 20 | 30 days supply | Qty: 30 | Fill #0

## 2017-03-04 MED FILL — LEVOTHYROXINE 100 MCG TAB: 100 | 30 days supply | Qty: 30 | Fill #0

## 2017-03-04 MED FILL — ESCITALOPRAM 10 MG TABLET: 10 | 30 days supply | Qty: 30 | Fill #2

## 2017-03-04 MED FILL — ?ATORVASTATIN 40MG TABLET: 40 | 30 days supply | Qty: 30 | Fill #3

## 2017-03-04 MED FILL — LOSARTAN POTASSIUM 50 MG TA: 50 | 30 days supply | Qty: 30 | Fill #1

## 2017-03-04 MED FILL — ?MONTELUKAST SOD 10MG TAB: 10 | 30 days supply | Qty: 30 | Fill #8

## 2017-03-07 ENCOUNTER — Ambulatory Visit (HOSPITAL_COMMUNITY)
Admission: EM | Admit: 2017-03-07 | Discharge: 2017-03-07 | Disposition: A | Discharge status: Home / Self Care | Payer: Self-pay | Attending: Emergency Medicine | Admitting: Emergency Medicine

## 2017-03-07 ENCOUNTER — Encounter (HOSPITAL_COMMUNITY): Payer: Self-pay | Admitting: Emergency Medicine

## 2017-03-07 ENCOUNTER — Other Ambulatory Visit: Payer: Self-pay | Admitting: Family Medicine

## 2017-03-07 DIAGNOSIS — J4541 Moderate persistent asthma with (acute) exacerbation: Secondary | ICD-10-CM

## 2017-03-07 DIAGNOSIS — R0602 Shortness of breath: Secondary | ICD-10-CM

## 2017-03-07 DIAGNOSIS — Z720 Tobacco use: Secondary | ICD-10-CM

## 2017-03-07 DIAGNOSIS — R062 Wheezing: Secondary | ICD-10-CM

## 2017-03-07 MED ORDER — IPRATROPIUM-ALBUTEROL 0.5-2.5 (3) MG/3ML IN SOLN
RESPIRATORY_TRACT | Status: AC
  Filled 2017-03-07: qty 3

## 2017-03-07 MED ORDER — METHYLPREDNISOLONE ACETATE 80 MG/ML IJ SUSP
80.0000 mg | Freq: Once | INTRAMUSCULAR | Status: AC
  Administered 2017-03-07: 80 mg via INTRAMUSCULAR

## 2017-03-07 MED ORDER — IPRATROPIUM-ALBUTEROL 0.5-2.5 (3) MG/3ML IN SOLN
3.0000 mL | Freq: Once | RESPIRATORY_TRACT | Status: AC
  Administered 2017-03-07: 3 mL via RESPIRATORY_TRACT

## 2017-03-07 MED ORDER — IPRATROPIUM-ALBUTEROL 0.5-2.5 (3) MG/3ML IN SOLN: 3.0000 mL | Freq: Four times a day (QID) | RESPIRATORY_TRACT | 0 refills | Status: DC | PRN

## 2017-03-07 MED ORDER — METHYLPREDNISOLONE ACETATE 80 MG/ML IJ SUSP
INTRAMUSCULAR | Status: AC
  Filled 2017-03-07: qty 1

## 2017-03-07 MED ORDER — PREDNISONE 50 MG PO TABS: ORAL_TABLET | ORAL | 0 refills | Status: DC

## 2017-03-07 MED FILL — !BREO ELLIPTA 200-25 MCG: 200-25 | 30 days supply | Qty: 60 | Fill #0

## 2017-03-07 MED FILL — !VENTOLIN HFA INHALER: 108 (90 BAS | 16 days supply | Qty: 18 | Fill #0

## 2017-03-07 NOTE — Discharge Instructions (Signed)
Use the DuoNeb every 6-8 hours as needed. She can use the albuterol every 4 hours if needed. Take the prednisone as directed. Follow-up with her primary care doctor. Recommend not smoking.

## 2017-03-07 NOTE — ED Triage Notes (Signed)
Pt here for asthma onset 0930 associated w/dyspnea, SOB, CP  Sts she's been to PCP but they were not able to see her  Smoking 0.5 PPD x50 years   A&O x4... NAD... Ambulatory.... Speaking in complete sentences

## 2017-03-07 NOTE — ED Provider Notes (Signed)
[place]    CSN: [id number] Arrival date & time: 03/07/17  1604     History   Chief Complaint Chief Complaint  Patient presents with  . Asthma    HPI [name] [name] is a 61 y.o. female.   Patient seen by provider on arrival. She has known asthma and is a persistent smoker. She comes in with shortness of breath and wheezing. Demonstrates in and out wheezing. DuoNeb is ordered as well as injection of steroid.  62 year old female who continues to smoke and has COPD and bronchitis presents to urgent care with exacerbation of her asthma. She has used her albuterol neb at home but not getting any better. She is complaining of wheezing and shortness of breath. Denies chest pain.      Past Medical History:  Diagnosis Date  . Anxiety    takes Lexapro daily  . Arthritis   . Asthma   . Bartholin gland cyst 08/29/2011  . Bronchitis    couple of months ago  . Bruises easily    pt is on Effient  . Chronic back pain    herniated nucleus pulposus  . COPD (chronic obstructive pulmonary disease) ([place])    early stages  . Coronary artery disease   . Depression    takes Klonopin daily  . Diverticulosis   . GERD (gastroesophageal reflux disease)    takes Nexium daily  . H/O hiatal hernia   . Heart attack ([place]) 2011  . Hemorrhoids   . Hernia   . Hyperlipidemia    takes Lipitor daily  . Hypertension    takes Losartan daily and Labetalol bid  . Hypothyroidism    takes Synthroid daily  . Insomnia    hydroxyzine prn  . Joint pain   . Neck pain   . Psoriasis    elbows,knees,back  . Shortness of breath    with exertion  . Slowing of urinary stream   . Thyroid disease     Patient Active Problem List   Diagnosis Date Noted  . Perforated eardrum 09/11/2015  . Encounter for screening mammogram for breast cancer 09/11/2015  . Psoriasis 06/22/2015  . COPD (chronic obstructive pulmonary disease) ([place]) 05/18/2015  . Joint swelling, finger, hand 08/09/2014  .  Anxiety and depression 07/10/2013  . Essential hypertension, benign 06/07/2013  . Dyslipidemia 06/07/2013  . Asthma, chronic 06/07/2013  . Emphysema lung ([place]) 06/07/2013  . Chronic back pain 06/07/2013  . CAD S/P percutaneous coronary angioplasty 06/07/2013  . GERD (gastroesophageal reflux disease) 06/07/2013  . Breast lump on left side at 3 o'clock position 10/13/2012  . Bartholin gland cyst 08/29/2011  . S/P abdominal hysterectomy and right salpingo-oophorectomy 08/29/2011  . ABDOMINAL PAIN, LEFT LOWER QUADRANT 03/28/2010  . NONSPECIFIC ABN FINDING RAD & OTH EXAM GI TRACT 03/28/2010  . COPD exacerbation ([place]) 09/15/2009  . TOBACCO ABUSE 07/17/2009  . Chronic rhinitis 07/17/2009  . LUNG NODULE 07/17/2009  . ALLERGY, FOOD 07/17/2009  . Hypothyroidism 12/22/2008  . COUGH 12/22/2008    Past Surgical History:  Procedure Laterality Date  . ABDOMINAL HYSTERECTOMY  1977  . COLONOSCOPY    . CORONARY ANGIOPLASTY WITH STENT PLACEMENT  2011   x 2  . esophagogastroduodenscoy    . LAPAROSCOPY  1977   exploratory  . LUMBAR LAMINECTOMY/DECOMPRESSION MICRODISCECTOMY  08/05/2011   Procedure: LUMBAR LAMINECTOMY/DECOMPRESSION MICRODISCECTOMY;  Surgeon: Otilio Connors, MD;  Location: [facility];  Service: Neurosurgery;  Laterality: Right;  Right Lumbar four-five extraforaminal discectomy  .  TONSILLECTOMY     as a child    OB History    Gravida Para Term Preterm AB Living   1 1 1  0 0 1   SAB TAB Ectopic Multiple Live Births   0 0 0 0         Home Medications    Prior to Admission medications   Medication Sig Start Date End Date Taking? Authorizing Provider  benzonatate (TESSALON) 100 MG capsule Take 2 capsules (200 mg total) by mouth 3 (three) times daily as needed for cough. 02/07/17  Yes Lysbeth Penner, FNP  BREO ELLIPTA 200-25 MCG/INH AEPB INHALE 1 PUFF INTO THE LUNGS DAILY. 01/30/17  Yes Clent Demark, PA-C  Calcium 600 MG tablet Take 600 mg by mouth daily.  07/14/13  Yes  Patrecia Pour, NP  fluticasone (FLONASE) 50 MCG/ACT nasal spray Place 2 sprays into both nostrils at bedtime. 04/16/16  Yes Langeland, Dawn T, MD  fluticasone furoate-vilanterol (BREO ELLIPTA) 200-25 MCG/INH AEPB Inhale 1 puff into the lungs daily. 09/24/16  Yes Langeland, Dawn T, MD  gabapentin (NEURONTIN) 600 MG tablet TAKE 1 TABLET BY MOUTH 4 TIMES DAILY 07/22/16  Yes Jegede, Olugbemiga E, MD  hydrOXYzine (VISTARIL) 25 MG capsule TAKE 1 CAPSULE BY MOUTH 3 TIMES DAILY AS NEEDED. 02/07/17  Yes Jegede, Olugbemiga E, MD  levothyroxine (SYNTHROID, LEVOTHROID) 100 MCG tablet Take 1 tablet (100 mcg total) by mouth daily. 03/04/17  Yes Tresa Garter, MD  losartan (COZAAR) 50 MG tablet TAKE 1 TABLET BY MOUTH DAILY 01/30/17  Yes Clent Demark, PA-C  montelukast (SINGULAIR) 10 MG tablet Take 1 tablet (10 mg total) by mouth at bedtime. 08/21/16  Yes Clent Demark, PA-C  nicotine (NICODERM CQ - DOSED IN MG/24 HOURS) 14 mg/24hr patch Place 1 patch (14 mg total) onto the skin daily. 06/18/16  Yes Amao, Charlane Ferretti, MD  nicotine (NICODERM CQ - DOSED IN MG/24 HOURS) 21 mg/24hr patch Place 1 patch (21 mg total) onto the skin daily. 11/06/15  Yes Ollis, Brandi L, NP  omeprazole (PRILOSEC) 40 MG capsule TAKE 1 CAPSULE BY MOUTH ONCE DAILY 09/20/16  Yes Arnoldo Morale, MD  prasugrel (EFFIENT) 10 MG TABS tablet Take 1 tablet (10 mg total) by mouth daily. 07/14/13  Yes Patrecia Pour, NP  sodium chloride (OCEAN) 0.65 % nasal spray Place 1 spray into the nose as needed for congestion. 08/05/16  Yes Clent Demark, PA-C  traMADol (ULTRAM) 50 MG tablet Take 1 tablet (50 mg total) by mouth every 12 (twelve) hours as needed. 10/31/16  Yes Argentina Donovan, PA-C  VENTOLIN HFA 108 (90 Base) MCG/ACT inhaler INHALE 2 PUFFS INTO THE LUNGS EVERY 4 HOURS AS NEEDED FOR WHEEZING OR SHORTNESS OF BREATH. 01/30/17  Yes Clent Demark, PA-C  Vitamin D, Ergocalciferol, (DRISDOL) 50000 units CAPS capsule TAKE 1 CAPSULE BY MOUTH EVERY  7 DAYS. 06/29/16  Yes Langeland, Dawn T, MD  albuterol (PROVENTIL HFA;VENTOLIN HFA) 108 (90 Base) MCG/ACT inhaler Inhale 2 puffs into the lungs every 4 (four) hours as needed for wheezing or shortness of breath. 09/24/16 10/24/16  Maren Reamer, MD  atorvastatin (LIPITOR) 40 MG tablet Take 1 tablet (40 mg total) by mouth daily. 08/21/16 02/17/17  Clent Demark, PA-C  clotrimazole (LOTRIMIN) 1 % cream Apply 1 application topically 2 (two) times daily. apply to tips of fingers 06/18/16   Arnoldo Morale, MD  escitalopram (LEXAPRO) 10 MG tablet Take 1 tablet (10 mg total) by mouth daily. 08/21/16  02/17/17  Clent Demark, PA-C  fluocinolone (VANOS) 0.01 % cream Apply topically 2 (two) times daily. 06/18/16   Arnoldo Morale, MD  furosemide (LASIX) 20 MG tablet TAKE 1 TABLET BY MOUTH DAILY. 03/04/17   Tresa Garter, MD  ipratropium-albuterol (DUONEB) 0.5-2.5 (3) MG/3ML SOLN Take 3 mLs by nebulization every 6 (six) hours as needed. 03/07/17   Janne Napoleon, NP  labetalol (NORMODYNE) 100 MG tablet Take 1 tablet (100 mg total) by mouth 2 (two) times daily. 02/07/17   Tresa Garter, MD  predniSONE (DELTASONE) 50 MG tablet 1 tab po daily for 6 days. Take with food. 03/07/17   Janne Napoleon, NP    Family History Family History  Problem Relation Age of Onset  . Heart disease Mother   . Hypertension Mother   . Stroke Mother   . Heart disease Father   . Hypertension Father   . Diabetes Father   . Cancer Maternal Aunt        breast  . Thyroid disease Paternal Aunt   . Heart disease Maternal Grandfather   . Anesthesia problems Daughter   . Cancer Paternal Grandmother        mouth  . Hypotension Neg Hx   . Malignant hyperthermia Neg Hx   . Pseudochol deficiency Neg Hx     Social History Social History  Substance Use Topics  . Smoking status: Current Every Day Smoker    Packs/day: 1.00    Years: 44.00    Types: Cigarettes  . Smokeless tobacco: Never Used  . Alcohol use Not on file      Allergies   Other; Codeine; and Latex   Review of Systems Review of Systems  Constitutional: Positive for activity change. Negative for fever.  HENT: Negative.   Respiratory: Positive for cough, shortness of breath and wheezing.   Cardiovascular: Negative for chest pain.  Gastrointestinal: Negative.   All other systems reviewed and are negative.    Physical Exam Triage Vital Signs ED Triage Vitals  Enc Vitals Group     BP 03/07/17 1609 128/81     Pulse Rate 03/07/17 1609 82     Resp 03/07/17 1609 20     Temp 03/07/17 1609 97.9 F (36.6 C)     Temp Source 03/07/17 1609 Oral     SpO2 03/07/17 1609 95 %     Weight --      Height --      Head Circumference --      Peak Flow --      Pain Score 03/07/17 1610 7     Pain Loc --      Pain Edu? --      Excl. in [place]? --    No data found.   Updated Vital Signs BP 128/81 (BP Location: Right Arm)   Pulse 82   Temp 97.9 F (36.6 C) (Oral)   Resp 20   SpO2 95%   Visual Acuity Right Eye Distance:   Left Eye Distance:   Bilateral Distance:    Right Eye Near:   Left Eye Near:    Bilateral Near:     Physical Exam  Constitutional: She is oriented to person, place, and time. She appears well-developed and well-nourished. No distress.  Eyes: EOM are normal.  Neck: Neck supple.  Cardiovascular: Normal rate, regular rhythm, normal heart sounds and intact distal pulses.   Pulmonary/Chest:  Increased respiratory effort. Inspiratory neck 3 wheezes, prolonged expiratory phase.  Post DuoNeb patient states she  is breathing better she feels better she is moving more air and auscultation reveals a decrease in wheeze. She states that she does not need another DuoNeb or albuterol neb at this time and is ready to go home. This assessment and 1710 hrs.  Musculoskeletal: Normal range of motion.  Neurological: She is alert and oriented to person, place, and time.  Skin: Skin is warm and dry.  Psychiatric: She has a normal mood and  affect.  Nursing note and vitals reviewed.    UC Treatments / Results  Labs (all labs ordered are listed, but only abnormal results are displayed) Labs Reviewed - No data to display  EKG  EKG Interpretation None       Radiology No results found.  Procedures Procedures (including critical care time)  Medications Ordered in UC Medications  ipratropium-albuterol (DUONEB) 0.5-2.5 (3) MG/3ML nebulizer solution 3 mL (3 mLs Nebulization Given 03/07/17 1624)  methylPREDNISolone acetate (DEPO-MEDROL) injection 80 mg (80 mg Intramuscular Given 03/07/17 1624)     Initial Impression / Assessment and Plan / UC Course  I have reviewed the triage vital signs and the nursing notes.  Pertinent labs & imaging results that were available during my care of the patient were reviewed by me and considered in my medical decision making (see chart for details).     Use the DuoNeb every 6-8 hours as needed. She can use the albuterol every 4 hours if needed. Take the prednisone as directed. Follow-up with her primary care doctor. Recommend not smoking.   Final Clinical Impressions(s) / UC Diagnoses   Final diagnoses:  Moderate persistent asthma with exacerbation    New Prescriptions New Prescriptions   IPRATROPIUM-ALBUTEROL (DUONEB) 0.5-2.5 (3) MG/3ML SOLN    Take 3 mLs by nebulization every 6 (six) hours as needed.   PREDNISONE (DELTASONE) 50 MG TABLET    1 tab po daily for 6 days. Take with food.     Controlled Substance Prescriptions [place] Controlled Substance Registry consulted? Not Applicable   Janne Napoleon, NP 03/07/17 1729

## 2017-03-12 NOTE — Progress Notes (Signed)
Patient ID: [name] [name], female   DOB: Nov 21, 1955, 61 y.o.   MRN: [id number]   [name] [name], is a 61 y.o. female  YQI:[id number]  LOV:[id number]  DOB - 1955/10/09  Subjective:  Chief Complaint and HPI: [name] [name] is a 61 y.o. female here today for a follow up visit After being seen in the Urgent Care 03/07/2017 for SOB and wheezing.  She was treated with duoneb and 80mg  IM prednisone.  She was discharged on 50mg  prednisone daily-just took last dose.  She is not improving but not worse.  +cough with some yellow and green mucus.  +low grade temp today.  Still smoking  Request FR of gabapentin, flexeril, and omeprazole.  Was supposed to get an A1C done several months ago but didn't come in for that.    Denies s/sx hyperglycemia    ED/Hospital notes reviewed.     ROS:   Constitutional:  No f/c, No night sweats, No unexplained weight loss. EENT:  No vision changes, No blurry vision, No hearing changes. No mouth, throat, or ear problems.  Respiratory: + cough, + SOB Cardiac: No CP, no palpitations GI:  No abd pain, No N/V/D. GU: No Urinary s/sx Musculoskeletal: No joint pain Neuro: No headache, no dizziness, no motor weakness.  Skin: No rash Endocrine:  No polydipsia. No polyuria.  Psych: Denies SI/HI  No problems updated.  ALLERGIES: Allergies  Allergen Reactions  . Other Anaphylaxis    avacdos  . Codeine Nausea Only  . Latex     REACTION: shortness of breath and rash    PAST MEDICAL HISTORY: Past Medical History:  Diagnosis Date  . Anxiety    takes Lexapro daily  . Arthritis   . Asthma   . Bartholin gland cyst 08/29/2011  . Bronchitis    couple of months ago  . Bruises easily    pt is on Effient  . Chronic back pain    herniated nucleus pulposus  . COPD (chronic obstructive pulmonary disease) ([place])    early stages  . Coronary artery disease   . Depression    takes Klonopin daily  . Diverticulosis   . GERD (gastroesophageal reflux disease)    takes Nexium  daily  . H/O hiatal hernia   . Heart attack ([place]) 2011  . Hemorrhoids   . Hernia   . Hyperlipidemia    takes Lipitor daily  . Hypertension    takes Losartan daily and Labetalol bid  . Hypothyroidism    takes Synthroid daily  . Insomnia    hydroxyzine prn  . Joint pain   . Neck pain   . Psoriasis    elbows,knees,back  . Shortness of breath    with exertion  . Slowing of urinary stream   . Thyroid disease     MEDICATIONS AT HOME: Prior to Admission medications   Medication Sig Start Date End Date Taking? Authorizing Provider  BREO ELLIPTA 200-25 MCG/INH AEPB INHALE 1 PUFF INTO THE LUNGS DAILY. 01/30/17  Yes Clent Demark, PA-C  Calcium 600 MG tablet Take 600 mg by mouth daily.  07/14/13  Yes Patrecia Pour, NP  clotrimazole (LOTRIMIN) 1 % cream Apply 1 application topically 2 (two) times daily. apply to tips of fingers 06/18/16  Yes Amao, Charlane Ferretti, MD  fluocinolone (VANOS) 0.01 % cream Apply topically 2 (two) times daily. 06/18/16  Yes Arnoldo Morale, MD  fluticasone (FLONASE) 50 MCG/ACT nasal spray Place 2 sprays into both nostrils at bedtime. 04/16/16  Yes Langeland, Dawn T,  MD  fluticasone furoate-vilanterol (BREO ELLIPTA) 200-25 MCG/INH AEPB Inhale 1 puff into the lungs daily. 09/24/16  Yes Langeland, Dawn T, MD  furosemide (LASIX) 20 MG tablet TAKE 1 TABLET BY MOUTH DAILY. 03/04/17  Yes Tresa Garter, MD  gabapentin (NEURONTIN) 600 MG tablet TAKE 1 TABLET BY MOUTH 4 TIMES DAILY 03/13/17  Yes Raeana Blinn M, PA-C  hydrOXYzine (VISTARIL) 25 MG capsule TAKE 1 CAPSULE BY MOUTH 3 TIMES DAILY AS NEEDED. 02/07/17  Yes Jegede, Olugbemiga E, MD  ipratropium-albuterol (DUONEB) 0.5-2.5 (3) MG/3ML SOLN Take 3 mLs by nebulization every 6 (six) hours as needed. 03/07/17  Yes Mabe, Shanon Brow, NP  labetalol (NORMODYNE) 100 MG tablet Take 1 tablet (100 mg total) by mouth 2 (two) times daily. 02/07/17  Yes Tresa Garter, MD  levothyroxine (SYNTHROID, LEVOTHROID) 100 MCG tablet Take 1  tablet (100 mcg total) by mouth daily. 03/04/17  Yes Tresa Garter, MD  losartan (COZAAR) 50 MG tablet TAKE 1 TABLET BY MOUTH DAILY 01/30/17  Yes Clent Demark, PA-C  montelukast (SINGULAIR) 10 MG tablet Take 1 tablet (10 mg total) by mouth at bedtime. 08/21/16  Yes Clent Demark, PA-C  omeprazole (PRILOSEC) 40 MG capsule Take 1 capsule (40 mg total) by mouth daily. 03/13/17  Yes Freeman Caldron M, PA-C  prasugrel (EFFIENT) 10 MG TABS tablet Take 1 tablet (10 mg total) by mouth daily. 07/14/13  Yes Patrecia Pour, NP  sodium chloride (OCEAN) 0.65 % nasal spray Place 1 spray into the nose as needed for congestion. 08/05/16  Yes Clent Demark, PA-C  traMADol (ULTRAM) 50 MG tablet Take 1 tablet (50 mg total) by mouth every 12 (twelve) hours as needed. 10/31/16  Yes Argentina Donovan, PA-C  VENTOLIN HFA 108 (90 Base) MCG/ACT inhaler INHALE 2 PUFFS INTO THE LUNGS EVERY 4 HOURS AS NEEDED FOR WHEEZING OR SHORTNESS OF BREATH. 01/30/17  Yes Clent Demark, PA-C  Vitamin D, Ergocalciferol, (DRISDOL) 50000 units CAPS capsule TAKE 1 CAPSULE BY MOUTH EVERY 7 DAYS. 06/29/16  Yes Langeland, Dawn T, MD  albuterol (PROVENTIL HFA;VENTOLIN HFA) 108 (90 Base) MCG/ACT inhaler Inhale 2 puffs into the lungs every 4 (four) hours as needed for wheezing or shortness of breath. 09/24/16 10/24/16  Maren Reamer, MD  atorvastatin (LIPITOR) 40 MG tablet Take 1 tablet (40 mg total) by mouth daily. 08/21/16 02/17/17  Clent Demark, PA-C  azithromycin [facility]) 250 MG tablet Take 2 tabs today then 1 daily 03/13/17   Freeman Caldron M, PA-C  cyclobenzaprine (FLEXERIL) 10 MG tablet Take 1 tablet (10 mg total) by mouth 3 (three) times daily as needed. 03/13/17   Argentina Donovan, PA-C  escitalopram (LEXAPRO) 10 MG tablet Take 1 tablet (10 mg total) by mouth daily. 08/21/16 02/17/17  Clent Demark, PA-C  predniSONE (DELTASONE) 10 MG tablet 6,5,4,3,2,1 take each days dose in the morning with food 03/13/17   Argentina Donovan, PA-C     Objective:  EXAM:   Vitals:   03/13/17 1106  BP: 134/72  Pulse: 74  Resp: 18  Temp: 99.1 F (37.3 C)  TempSrc: Oral  SpO2: 95%  Weight: 147 lb (66.7 kg)  Height: 5' 1.5" (1.562 m)    General appearance : A&OX3. NAD. Non-toxic-appearing HEENT: Atraumatic and Normocephalic.  PERRLA. EOM intact.  Neck: supple, no JVD. No cervical lymphadenopathy. No thyromegaly Chest/Lungs:  Breathing-non-labored, Good air entry bilaterally, breath sounds without rales and rhonchi, but there is diffuse mild to moderate wheezing throughout  CVS: S1 S2 regular, no murmurs, gallops, rubs Extremities: Bilateral Lower Ext shows no edema, both legs are warm to touch with = pulse throughout Neurology:  CN II-XII grossly intact, Non focal.   Psych:  TP linear. J/I WNL. Normal speech. Appropriate eye contact and affect.  Skin:  No Rash  Data Review Lab Results  Component Value Date   HGBA1C 6.0 06/22/2015   HGBA1C 5.60 08/09/2014     Assessment & Plan   1. Type 2 diabetes mellitus with complication, without long-term current use of insulin (HCC) Newly diagnosed=A1C=6.2 today Will defer metformin for now-she will try and work on diet and exercise. - HgB A1c  2. Gastroesophageal reflux disease, esophagitis presence not specified - omeprazole (PRILOSEC) 40 MG capsule; Take 1 capsule (40 mg total) by mouth daily.  Dispense: 30 capsule; Refill: 3  3. Bronchitis - azithromycin (ZITHROMAX) 250 MG tablet; Take 2 tabs today then 1 daily  Dispense: 6 tablet; Refill: 0 - predniSONE (DELTASONE) 10 MG tablet; 6,5,4,3,2,1 take each days dose in the morning with food  Dispense: 21 tablet; Refill: 0  4. Smoking-advised patches or other cessation program  Counseled 25 mins face to face on smoking cessation and lifestyle/diet exercise changes related to newly diagnosed diabetes with patient and her daughter     Patient have been counseled extensively about nutrition and  exercise  Return in about 1 month (around 04/13/2017) for Dr Jarold Song for general health issues and needs labs.  The patient was given clear instructions to go to ER or return to medical center if symptoms don't improve, worsen or new problems develop. The patient verbalized understanding. The patient was told to call to get lab results if they haven't heard anything in the next week.     Freeman Caldron, PA-C [facility] and [facility] [place], [place]   03/13/2017, 11:22 AM

## 2017-03-13 ENCOUNTER — Encounter: Payer: Self-pay | Admitting: Physician Assistant

## 2017-03-13 ENCOUNTER — Ambulatory Visit: Payer: Self-pay | Attending: Internal Medicine | Admitting: Physician Assistant

## 2017-03-13 VITALS — BP 134/72 | HR 74 | Temp 99.1°F | Resp 18 | Ht 61.5 in | Wt 147.0 lb

## 2017-03-13 DIAGNOSIS — F17200 Nicotine dependence, unspecified, uncomplicated: Secondary | ICD-10-CM | POA: Insufficient documentation

## 2017-03-13 DIAGNOSIS — L409 Psoriasis, unspecified: Secondary | ICD-10-CM | POA: Insufficient documentation

## 2017-03-13 DIAGNOSIS — I2510 Atherosclerotic heart disease of native coronary artery without angina pectoris: Secondary | ICD-10-CM | POA: Insufficient documentation

## 2017-03-13 DIAGNOSIS — Z7951 Long term (current) use of inhaled steroids: Secondary | ICD-10-CM | POA: Insufficient documentation

## 2017-03-13 DIAGNOSIS — F419 Anxiety disorder, unspecified: Secondary | ICD-10-CM | POA: Insufficient documentation

## 2017-03-13 DIAGNOSIS — J40 Bronchitis, not specified as acute or chronic: Secondary | ICD-10-CM

## 2017-03-13 DIAGNOSIS — K219 Gastro-esophageal reflux disease without esophagitis: Secondary | ICD-10-CM

## 2017-03-13 DIAGNOSIS — E039 Hypothyroidism, unspecified: Secondary | ICD-10-CM | POA: Insufficient documentation

## 2017-03-13 DIAGNOSIS — E785 Hyperlipidemia, unspecified: Secondary | ICD-10-CM | POA: Insufficient documentation

## 2017-03-13 DIAGNOSIS — J449 Chronic obstructive pulmonary disease, unspecified: Secondary | ICD-10-CM | POA: Insufficient documentation

## 2017-03-13 DIAGNOSIS — G8929 Other chronic pain: Secondary | ICD-10-CM | POA: Insufficient documentation

## 2017-03-13 DIAGNOSIS — M549 Dorsalgia, unspecified: Secondary | ICD-10-CM | POA: Insufficient documentation

## 2017-03-13 DIAGNOSIS — F329 Major depressive disorder, single episode, unspecified: Secondary | ICD-10-CM | POA: Insufficient documentation

## 2017-03-13 DIAGNOSIS — E118 Type 2 diabetes mellitus with unspecified complications: Secondary | ICD-10-CM

## 2017-03-13 DIAGNOSIS — I252 Old myocardial infarction: Secondary | ICD-10-CM | POA: Insufficient documentation

## 2017-03-13 DIAGNOSIS — G4700 Insomnia, unspecified: Secondary | ICD-10-CM | POA: Insufficient documentation

## 2017-03-13 DIAGNOSIS — K449 Diaphragmatic hernia without obstruction or gangrene: Secondary | ICD-10-CM | POA: Insufficient documentation

## 2017-03-13 LAB — POCT GLYCOSYLATED HEMOGLOBIN (HGB A1C): Hemoglobin A1C: 6.2

## 2017-03-13 MED ORDER — GABAPENTIN 600 MG PO TABS: ORAL_TABLET | ORAL | 0 refills | Status: DC

## 2017-03-13 MED ORDER — AZITHROMYCIN 250 MG PO TABS: ORAL_TABLET | ORAL | 0 refills | Status: DC

## 2017-03-13 MED ORDER — PREDNISONE 10 MG PO TABS: ORAL_TABLET | ORAL | 0 refills | Status: DC

## 2017-03-13 MED ORDER — OMEPRAZOLE 40 MG PO CPDR: 40.0000 mg | DELAYED_RELEASE_CAPSULE | Freq: Every day | ORAL | 3 refills | Status: DC

## 2017-03-13 MED ORDER — CYCLOBENZAPRINE HCL 10 MG PO TABS: 10.0000 mg | ORAL_TABLET | Freq: Three times a day (TID) | ORAL | 2 refills | Status: DC | PRN

## 2017-03-13 MED FILL — AZITHROMYCIN 250 MG TABLET: 250 | 5 days supply | Qty: 6 | Fill #0

## 2017-03-13 MED FILL — ?PREDNISONE 10 MG TABLET: 10 | 7 days supply | Qty: 21 | Fill #0

## 2017-03-13 MED FILL — OMEPRAZOLE DR 40 MG CAPSULE: 40 | 30 days supply | Qty: 30 | Fill #0

## 2017-03-13 MED FILL — GABAPENTIN 600 MG TABLET: 600 | 30 days supply | Qty: 120 | Fill #0

## 2017-03-13 MED FILL — IPRAT-ALBUT 0.5-3(2.5) MG/3: 0.5-2.5 (3) | 7 days supply | Qty: 90 | Fill #0

## 2017-03-13 MED FILL — ?CYCLOBENZAPRINE 10 MG TABL: 10 | 10 days supply | Qty: 30 | Fill #0

## 2017-03-18 ENCOUNTER — Ambulatory Visit: Payer: Self-pay | Attending: Family Medicine

## 2017-03-18 ENCOUNTER — Other Ambulatory Visit: Payer: Self-pay | Admitting: Internal Medicine

## 2017-03-18 DIAGNOSIS — F329 Major depressive disorder, single episode, unspecified: Secondary | ICD-10-CM

## 2017-03-18 DIAGNOSIS — F419 Anxiety disorder, unspecified: Principal | ICD-10-CM

## 2017-03-18 DIAGNOSIS — I10 Essential (primary) hypertension: Secondary | ICD-10-CM

## 2017-03-18 DIAGNOSIS — F32A Depression, unspecified: Secondary | ICD-10-CM

## 2017-03-18 MED FILL — HYDROXYZINE PAM 25 MG CAP: 25 | 30 days supply | Qty: 90 | Fill #0

## 2017-03-18 MED FILL — LEVOTHYROXINE 100 MCG TAB: 100 | 2 days supply | Qty: 2 | Fill #3

## 2017-03-18 MED FILL — LABETALOL HCL 100 MG TABLET: 100 | 30 days supply | Qty: 60 | Fill #0

## 2017-03-25 ENCOUNTER — Emergency Department (HOSPITAL_COMMUNITY): Payer: Self-pay

## 2017-03-25 ENCOUNTER — Encounter (HOSPITAL_COMMUNITY): Payer: Self-pay | Admitting: Emergency Medicine

## 2017-03-25 ENCOUNTER — Inpatient Hospital Stay (HOSPITAL_COMMUNITY)
Admission: EM | Admit: 2017-03-25 | Discharge: 2017-03-27 | DRG: 287 | Disposition: A | Discharge status: Home / Self Care | Payer: Self-pay | Attending: Internal Medicine | Admitting: Internal Medicine

## 2017-03-25 DIAGNOSIS — I10 Essential (primary) hypertension: Secondary | ICD-10-CM | POA: Diagnosis present

## 2017-03-25 DIAGNOSIS — I2582 Chronic total occlusion of coronary artery: Secondary | ICD-10-CM | POA: Diagnosis present

## 2017-03-25 DIAGNOSIS — Z9861 Coronary angioplasty status: Secondary | ICD-10-CM

## 2017-03-25 DIAGNOSIS — I252 Old myocardial infarction: Secondary | ICD-10-CM

## 2017-03-25 DIAGNOSIS — M549 Dorsalgia, unspecified: Secondary | ICD-10-CM | POA: Diagnosis present

## 2017-03-25 DIAGNOSIS — Z23 Encounter for immunization: Secondary | ICD-10-CM

## 2017-03-25 DIAGNOSIS — Z7951 Long term (current) use of inhaled steroids: Secondary | ICD-10-CM

## 2017-03-25 DIAGNOSIS — Z9119 Patient's noncompliance with other medical treatment and regimen: Secondary | ICD-10-CM

## 2017-03-25 DIAGNOSIS — E785 Hyperlipidemia, unspecified: Secondary | ICD-10-CM | POA: Diagnosis present

## 2017-03-25 DIAGNOSIS — T82855A Stenosis of coronary artery stent, initial encounter: Secondary | ICD-10-CM | POA: Diagnosis present

## 2017-03-25 DIAGNOSIS — R079 Chest pain, unspecified: Secondary | ICD-10-CM | POA: Diagnosis present

## 2017-03-25 DIAGNOSIS — F17200 Nicotine dependence, unspecified, uncomplicated: Secondary | ICD-10-CM | POA: Diagnosis present

## 2017-03-25 DIAGNOSIS — F1490 Cocaine use, unspecified, uncomplicated: Secondary | ICD-10-CM | POA: Diagnosis present

## 2017-03-25 DIAGNOSIS — G4700 Insomnia, unspecified: Secondary | ICD-10-CM | POA: Diagnosis present

## 2017-03-25 DIAGNOSIS — Z90710 Acquired absence of both cervix and uterus: Secondary | ICD-10-CM

## 2017-03-25 DIAGNOSIS — F17210 Nicotine dependence, cigarettes, uncomplicated: Secondary | ICD-10-CM | POA: Diagnosis present

## 2017-03-25 DIAGNOSIS — I2510 Atherosclerotic heart disease of native coronary artery without angina pectoris: Secondary | ICD-10-CM

## 2017-03-25 DIAGNOSIS — Z79899 Other long term (current) drug therapy: Secondary | ICD-10-CM

## 2017-03-25 DIAGNOSIS — E039 Hypothyroidism, unspecified: Secondary | ICD-10-CM | POA: Diagnosis present

## 2017-03-25 DIAGNOSIS — F419 Anxiety disorder, unspecified: Secondary | ICD-10-CM | POA: Diagnosis present

## 2017-03-25 DIAGNOSIS — I25110 Atherosclerotic heart disease of native coronary artery with unstable angina pectoris: Principal | ICD-10-CM | POA: Diagnosis present

## 2017-03-25 DIAGNOSIS — J449 Chronic obstructive pulmonary disease, unspecified: Secondary | ICD-10-CM | POA: Diagnosis present

## 2017-03-25 DIAGNOSIS — F329 Major depressive disorder, single episode, unspecified: Secondary | ICD-10-CM | POA: Diagnosis present

## 2017-03-25 DIAGNOSIS — Z9889 Other specified postprocedural states: Secondary | ICD-10-CM

## 2017-03-25 DIAGNOSIS — G8929 Other chronic pain: Secondary | ICD-10-CM | POA: Diagnosis present

## 2017-03-25 DIAGNOSIS — F32A Depression, unspecified: Secondary | ICD-10-CM | POA: Diagnosis present

## 2017-03-25 DIAGNOSIS — K219 Gastro-esophageal reflux disease without esophagitis: Secondary | ICD-10-CM | POA: Diagnosis present

## 2017-03-25 DIAGNOSIS — Z9114 Patient's other noncompliance with medication regimen: Secondary | ICD-10-CM

## 2017-03-25 DIAGNOSIS — Z809 Family history of malignant neoplasm, unspecified: Secondary | ICD-10-CM

## 2017-03-25 DIAGNOSIS — Z91040 Latex allergy status: Secondary | ICD-10-CM

## 2017-03-25 DIAGNOSIS — R9431 Abnormal electrocardiogram [ECG] [EKG]: Secondary | ICD-10-CM

## 2017-03-25 DIAGNOSIS — Z823 Family history of stroke: Secondary | ICD-10-CM

## 2017-03-25 DIAGNOSIS — L409 Psoriasis, unspecified: Secondary | ICD-10-CM | POA: Diagnosis present

## 2017-03-25 DIAGNOSIS — Z833 Family history of diabetes mellitus: Secondary | ICD-10-CM

## 2017-03-25 DIAGNOSIS — Z888 Allergy status to other drugs, medicaments and biological substances status: Secondary | ICD-10-CM

## 2017-03-25 DIAGNOSIS — Z8249 Family history of ischemic heart disease and other diseases of the circulatory system: Secondary | ICD-10-CM

## 2017-03-25 DIAGNOSIS — Z885 Allergy status to narcotic agent status: Secondary | ICD-10-CM

## 2017-03-25 HISTORY — DX: Prediabetes: R73.03

## 2017-03-25 HISTORY — DX: Pneumonia, unspecified organism: J18.9

## 2017-03-25 HISTORY — DX: Stress incontinence (female) (male): N39.3

## 2017-03-25 HISTORY — DX: Abnormal electrocardiogram (ECG) (EKG): R94.31

## 2017-03-25 LAB — CBC
HCT: 42.8 % (ref 36.0–46.0)
Hemoglobin: 14.1 g/dL (ref 12.0–15.0)
MCH: 29.9 pg (ref 26.0–34.0)
MCHC: 32.9 g/dL (ref 30.0–36.0)
MCV: 90.9 fL (ref 78.0–100.0)
PLATELETS: 345 10*3/uL (ref 150–400)
RBC: 4.71 MIL/uL (ref 3.87–5.11)
RDW: 16.4 % — AB (ref 11.5–15.5)
WBC: 11.6 10*3/uL — ABNORMAL HIGH (ref 4.0–10.5)

## 2017-03-25 LAB — BASIC METABOLIC PANEL
Anion gap: 9 (ref 5–15)
BUN: 14 mg/dL (ref 6–20)
CALCIUM: 9.2 mg/dL (ref 8.9–10.3)
CO2: 25 mmol/L (ref 22–32)
Chloride: 103 mmol/L (ref 101–111)
Creatinine, Ser: 0.9 mg/dL (ref 0.44–1.00)
GFR calc Af Amer: >60 mL/min (ref >60)
GLUCOSE: 108 mg/dL — AB (ref 65–99)
Potassium: 4 mmol/L (ref 3.5–5.1)
Sodium: 137 mmol/L (ref 135–145)

## 2017-03-25 LAB — GLUCOSE, CAPILLARY
Glucose-Capillary: 157 mg/dL — ABNORMAL HIGH (ref 65–99)
Glucose-Capillary: 203 mg/dL — ABNORMAL HIGH (ref 65–99)

## 2017-03-25 LAB — TROPONIN I
Troponin I: <0.03 ng/mL (ref <0.03)
Troponin I: <0.03 ng/mL (ref <0.03)
Troponin I: <0.03 ng/mL (ref <0.03)

## 2017-03-25 LAB — I-STAT TROPONIN, ED: TROPONIN I, POC: 0 ng/mL (ref 0.00–0.08)

## 2017-03-25 MED ORDER — CYCLOBENZAPRINE HCL 10 MG PO TABS
10.0000 mg | ORAL_TABLET | Freq: Three times a day (TID) | ORAL | Status: DC | PRN
  Administered 2017-03-25 – 2017-03-26 (×4): 10 mg via ORAL
  Filled 2017-03-25 (×4): qty 1

## 2017-03-25 MED ORDER — ACETAMINOPHEN 325 MG PO TABS: 650.0000 mg | ORAL_TABLET | Freq: Four times a day (QID) | ORAL | Status: DC | PRN

## 2017-03-25 MED ORDER — SODIUM CHLORIDE 0.9% FLUSH
3.0000 mL | Freq: Two times a day (BID) | INTRAVENOUS | Status: DC
  Administered 2017-03-25 – 2017-03-26 (×2): 3 mL via INTRAVENOUS

## 2017-03-25 MED ORDER — PANTOPRAZOLE SODIUM 40 MG PO TBEC
80.0000 mg | DELAYED_RELEASE_TABLET | Freq: Every day | ORAL | Status: DC
  Administered 2017-03-26 – 2017-03-27 (×2): 80 mg via ORAL
  Filled 2017-03-25 (×2): qty 2

## 2017-03-25 MED ORDER — LEVOTHYROXINE SODIUM 100 MCG PO TABS
100.0000 ug | ORAL_TABLET | Freq: Every day | ORAL | Status: DC
  Administered 2017-03-26 – 2017-03-27 (×2): 100 ug via ORAL
  Filled 2017-03-25 (×2): qty 1

## 2017-03-25 MED ORDER — SODIUM CHLORIDE 0.9 % IV SOLN: 250.0000 mL | INTRAVENOUS | Status: DC | PRN

## 2017-03-25 MED ORDER — GI COCKTAIL ~~LOC~~: 30.0000 mL | Freq: Four times a day (QID) | ORAL | Status: DC | PRN

## 2017-03-25 MED ORDER — HYDROXYZINE HCL 25 MG PO TABS
25.0000 mg | ORAL_TABLET | Freq: Three times a day (TID) | ORAL | Status: DC | PRN
  Administered 2017-03-25 – 2017-03-27 (×5): 25 mg via ORAL
  Filled 2017-03-25 (×6): qty 1

## 2017-03-25 MED ORDER — ATORVASTATIN CALCIUM 40 MG PO TABS
40.0000 mg | ORAL_TABLET | Freq: Every day | ORAL | Status: DC
  Administered 2017-03-26: 17:00:00 40 mg via ORAL
  Filled 2017-03-25: qty 1

## 2017-03-25 MED ORDER — LABETALOL HCL 200 MG PO TABS
100.0000 mg | ORAL_TABLET | Freq: Two times a day (BID) | ORAL | Status: DC
  Administered 2017-03-25 – 2017-03-27 (×5): 100 mg via ORAL
  Filled 2017-03-25 (×5): qty 1

## 2017-03-25 MED ORDER — NICOTINE 21 MG/24HR TD PT24
21.0000 mg | MEDICATED_PATCH | Freq: Every day | TRANSDERMAL | Status: DC
  Administered 2017-03-25 – 2017-03-27 (×3): 21 mg via TRANSDERMAL
  Filled 2017-03-25 (×3): qty 1

## 2017-03-25 MED ORDER — HEPARIN SODIUM (PORCINE) 5000 UNIT/ML IJ SOLN
5000.0000 [IU] | Freq: Three times a day (TID) | INTRAMUSCULAR | Status: DC
  Administered 2017-03-25 – 2017-03-26 (×2): 5000 [IU] via SUBCUTANEOUS
  Filled 2017-03-25 (×2): qty 1

## 2017-03-25 MED ORDER — NITROGLYCERIN 2 % TD OINT
1.0000 [in_us] | TOPICAL_OINTMENT | Freq: Once | TRANSDERMAL | Status: AC
  Administered 2017-03-25: 1 [in_us] via TOPICAL
  Filled 2017-03-25: qty 1

## 2017-03-25 MED ORDER — IPRATROPIUM-ALBUTEROL 0.5-2.5 (3) MG/3ML IN SOLN
3.0000 mL | Freq: Four times a day (QID) | RESPIRATORY_TRACT | Status: DC
  Administered 2017-03-25 – 2017-03-26 (×5): 3 mL via RESPIRATORY_TRACT
  Filled 2017-03-25 (×5): qty 3

## 2017-03-25 MED ORDER — SODIUM CHLORIDE 0.9% FLUSH: 3.0000 mL | INTRAVENOUS | Status: DC | PRN

## 2017-03-25 MED ORDER — ESCITALOPRAM OXALATE 10 MG PO TABS
10.0000 mg | ORAL_TABLET | Freq: Every day | ORAL | Status: DC
  Administered 2017-03-26 – 2017-03-27 (×2): 10 mg via ORAL
  Filled 2017-03-25 (×2): qty 1

## 2017-03-25 MED ORDER — HEPARIN SODIUM (PORCINE) 5000 UNIT/ML IJ SOLN: 5000.0000 [IU] | Freq: Three times a day (TID) | INTRAMUSCULAR | Status: DC

## 2017-03-25 MED ORDER — NITROGLYCERIN 0.4 MG SL SUBL
0.4000 mg | SUBLINGUAL_TABLET | SUBLINGUAL | Status: DC | PRN
Start: 2017-03-25 — End: 2017-03-27
  Filled 2017-03-25: qty 1

## 2017-03-25 MED ORDER — SODIUM CHLORIDE 0.9 % WEIGHT BASED INFUSION
3.0000 mL/kg/h | INTRAVENOUS | Status: DC
  Administered 2017-03-26: 3 mL/kg/h via INTRAVENOUS

## 2017-03-25 MED ORDER — ACETAMINOPHEN 650 MG RE SUPP: 650.0000 mg | Freq: Four times a day (QID) | RECTAL | Status: DC | PRN

## 2017-03-25 MED ORDER — ASPIRIN 81 MG PO CHEW
81.0000 mg | CHEWABLE_TABLET | ORAL | Status: AC
  Administered 2017-03-26: 81 mg via ORAL
  Filled 2017-03-25: qty 1

## 2017-03-25 MED ORDER — ONDANSETRON HCL 4 MG PO TABS: 4.0000 mg | ORAL_TABLET | Freq: Four times a day (QID) | ORAL | Status: DC | PRN

## 2017-03-25 MED ORDER — FLUTICASONE FUROATE-VILANTEROL 200-25 MCG/INH IN AEPB: 1.0000 | INHALATION_SPRAY | Freq: Every day | RESPIRATORY_TRACT | Status: DC

## 2017-03-25 MED ORDER — FLUTICASONE FUROATE-VILANTEROL 200-25 MCG/INH IN AEPB
1.0000 | INHALATION_SPRAY | Freq: Every day | RESPIRATORY_TRACT | Status: DC
  Administered 2017-03-26 – 2017-03-27 (×2): 1 via RESPIRATORY_TRACT
  Filled 2017-03-25: qty 28

## 2017-03-25 MED ORDER — SODIUM CHLORIDE 0.9 % WEIGHT BASED INFUSION: 1.0000 mL/kg/h | INTRAVENOUS | Status: DC

## 2017-03-25 MED ORDER — LOSARTAN POTASSIUM 50 MG PO TABS
50.0000 mg | ORAL_TABLET | Freq: Every day | ORAL | Status: DC
  Administered 2017-03-26 – 2017-03-27 (×2): 50 mg via ORAL
  Filled 2017-03-25 (×2): qty 1

## 2017-03-25 MED ORDER — GABAPENTIN 600 MG PO TABS
600.0000 mg | ORAL_TABLET | Freq: Four times a day (QID) | ORAL | Status: DC
  Administered 2017-03-25 – 2017-03-27 (×7): 600 mg via ORAL
  Filled 2017-03-25 (×7): qty 1

## 2017-03-25 MED ORDER — PRASUGREL HCL 10 MG PO TABS
10.0000 mg | ORAL_TABLET | Freq: Every day | ORAL | Status: DC
  Administered 2017-03-26: 10 mg via ORAL
  Filled 2017-03-25: qty 1

## 2017-03-25 MED ORDER — FUROSEMIDE 20 MG PO TABS
20.0000 mg | ORAL_TABLET | Freq: Every day | ORAL | Status: DC
  Administered 2017-03-26 – 2017-03-27 (×2): 20 mg via ORAL
  Filled 2017-03-25 (×2): qty 1

## 2017-03-25 MED ORDER — MONTELUKAST SODIUM 10 MG PO TABS
10.0000 mg | ORAL_TABLET | Freq: Every day | ORAL | Status: DC
  Administered 2017-03-25 – 2017-03-26 (×3): 10 mg via ORAL
  Filled 2017-03-25 (×3): qty 1

## 2017-03-25 MED ORDER — ONDANSETRON HCL 4 MG/2ML IJ SOLN: 4.0000 mg | Freq: Four times a day (QID) | INTRAMUSCULAR | Status: DC | PRN

## 2017-03-25 MED ORDER — DEXAMETHASONE SODIUM PHOSPHATE 10 MG/ML IJ SOLN
10.0000 mg | Freq: Once | INTRAMUSCULAR | Status: AC
  Administered 2017-03-25: 10 mg via INTRAVENOUS
  Filled 2017-03-25: qty 1

## 2017-03-25 NOTE — ED Notes (Signed)
Patient transported to x-ray. ?

## 2017-03-25 NOTE — ED Notes (Signed)
Patient returned to room and placed back on monitor.  

## 2017-03-25 NOTE — H&P (Signed)
History and Physical    [name] XBJ:[id number] DOB: 01/11/1956 DOA: 03/25/2017  PCP: Maren Reamer, MD Patient coming from: home  Chief Complaint: CP  HPI: [name] [name] is a 61 y.o. female with medical history significant of hypothyroidism, psoriasis, insomnia, hypertension, hyperlipidemia, CAD/MI, COPD,.   Chest pain. Acute onset at approximately 10:00 on day of admission. Occurred while patient was ambulating outside of her home. Pain is described as substernal and constant pressure. Relieved after ASA 325 and resting on her couch. Pain radiated to her jaw and right arm. Patient states that this is similar to her previous heart attack type pain. Patient had recurrence of her pain during transport by EMS. Patient also reports medical noncompliance for the last 2 months and has used cocaine approximately 3 days prior to admission. Patient has not used her antihypertensives for 2 days. Patient continues to smoke one pack of cigars per day. She was treated for COPD exacerbation with antibiotics and steroids approximately 3 weeks prior to admission which has not completely resolved. Patient's last cardiac intervention was in 2011 when she had multiple stents placed. Nuclear stress in 2015 was essentially normal.   ED Course: nitro paste applied. Objective findings outlined below  Review of Systems: As per HPI otherwise all other systems reviewed and are negative  Ambulatory Status:no restreictions at baseline.   Past Medical History:  Diagnosis Date  . Anxiety    takes Lexapro daily  . Arthritis   . Asthma   . Bartholin gland cyst 08/29/2011  . Bronchitis    couple of months ago  . Bruises easily    pt is on Effient  . Chronic back pain    herniated nucleus pulposus  . COPD (chronic obstructive pulmonary disease) ([place])    early stages  . Coronary artery disease   . Depression    takes Klonopin daily  . Diverticulosis   . GERD (gastroesophageal reflux disease)    takes  Nexium daily  . H/O hiatal hernia   . Heart attack ([place]) 2011  . Hemorrhoids   . Hernia   . Hyperlipidemia    takes Lipitor daily  . Hypertension    takes Losartan daily and Labetalol bid  . Hypothyroidism    takes Synthroid daily  . Insomnia    hydroxyzine prn  . Joint pain   . Neck pain   . Psoriasis    elbows,knees,back  . Shortness of breath    with exertion  . Slowing of urinary stream   . Thyroid disease     Past Surgical History:  Procedure Laterality Date  . ABDOMINAL HYSTERECTOMY  1977  . COLONOSCOPY    . CORONARY ANGIOPLASTY WITH STENT PLACEMENT  2011   x 2  . esophagogastroduodenscoy    . LAPAROSCOPY  1977   exploratory  . LUMBAR LAMINECTOMY/DECOMPRESSION MICRODISCECTOMY  08/05/2011   Procedure: LUMBAR LAMINECTOMY/DECOMPRESSION MICRODISCECTOMY;  Surgeon: Otilio Connors, MD;  Location: [place] [facility];  Service: Neurosurgery;  Laterality: Right;  Right Lumbar four-five extraforaminal discectomy  . TONSILLECTOMY     as a child    Social History   Social History  . Marital status: Single    Spouse name: N/A  . Number of children: N/A  . Years of education: N/A   Occupational History  . Not on file.   Social History Main Topics  . Smoking status: Current Every Day Smoker    Packs/day: 1.00    Years: 44.00    Types: Cigarettes  .  Smokeless tobacco: Never Used  . Alcohol use Not on file  . Drug use: No     Comment: cocaine use 2 days ago, smokes pot "from time to time"  . Sexual activity: Not Currently    Birth control/ protection: Surgical   Other Topics Concern  . Not on file   Social History Narrative  . No narrative on file    Allergies  Allergen Reactions  . Other Anaphylaxis    avocados  . Codeine Nausea Only  . Latex Rash    REACTION: shortness of breath and rash    Family History  Problem Relation Age of Onset  . Heart disease Mother   . Hypertension Mother   . Stroke Mother   . Heart disease Father   . Hypertension Father     . Diabetes Father   . Cancer Maternal Aunt        breast  . Thyroid disease Paternal Aunt   . Heart disease Maternal Grandfather   . Anesthesia problems Daughter   . Cancer Paternal Grandmother        mouth  . Hypotension Neg Hx   . Malignant hyperthermia Neg Hx   . Pseudochol deficiency Neg Hx       Prior to Admission medications   Medication Sig Start Date End Date Taking? Authorizing Provider  albuterol (PROVENTIL HFA;VENTOLIN HFA) 108 (90 Base) MCG/ACT inhaler Inhale 2 puffs into the lungs every 4 (four) hours as needed for wheezing or shortness of breath. 09/24/16 10/24/16  Maren Reamer, MD  atorvastatin (LIPITOR) 40 MG tablet Take 1 tablet (40 mg total) by mouth daily. 08/21/16 02/17/17  Clent Demark, PA-C  azithromycin (ZITHROMAX) 250 MG tablet Take 2 tabs today then 1 daily 03/13/17   Freeman Caldron M, PA-C  BREO ELLIPTA 200-25 MCG/INH AEPB INHALE 1 PUFF INTO THE LUNGS DAILY. 01/30/17   Clent Demark, PA-C  Calcium 600 MG tablet Take 600 mg by mouth daily.  07/14/13   Patrecia Pour, NP  clotrimazole (LOTRIMIN) 1 % cream Apply 1 application topically 2 (two) times daily. apply to tips of fingers 06/18/16   Arnoldo Morale, MD  cyclobenzaprine (FLEXERIL) 10 MG tablet Take 1 tablet (10 mg total) by mouth 3 (three) times daily as needed. 03/13/17   Argentina Donovan, PA-C  escitalopram (LEXAPRO) 10 MG tablet Take 1 tablet (10 mg total) by mouth daily. 08/21/16 02/17/17  Clent Demark, PA-C  fluocinolone (VANOS) 0.01 % cream Apply topically 2 (two) times daily. 06/18/16   Arnoldo Morale, MD  fluticasone (FLONASE) 50 MCG/ACT nasal spray Place 2 sprays into both nostrils at bedtime. 04/16/16   Langeland, Dawn T, MD  fluticasone furoate-vilanterol (BREO ELLIPTA) 200-25 MCG/INH AEPB Inhale 1 puff into the lungs daily. 09/24/16   Maren Reamer, MD  furosemide (LASIX) 20 MG tablet TAKE 1 TABLET BY MOUTH DAILY. 03/18/17   Tresa Garter, MD  gabapentin (NEURONTIN) 600 MG tablet  TAKE 1 TABLET BY MOUTH 4 TIMES DAILY 03/13/17   Freeman Caldron M, PA-C  hydrOXYzine (VISTARIL) 25 MG capsule TAKE 1 CAPSULE BY MOUTH 3 TIMES DAILY AS NEEDED. 03/18/17   Tresa Garter, MD  ipratropium-albuterol (DUONEB) 0.5-2.5 (3) MG/3ML SOLN Take 3 mLs by nebulization every 6 (six) hours as needed. 03/07/17   Janne Napoleon, NP  labetalol (NORMODYNE) 100 MG tablet TAKE 1 TABLET BY MOUTH 2 TIMES DAILY. 03/18/17   Tresa Garter, MD  levothyroxine (SYNTHROID, LEVOTHROID) 100 MCG tablet Take 1  tablet (100 mcg total) by mouth daily. 03/04/17   Tresa Garter, MD  losartan (COZAAR) 50 MG tablet TAKE 1 TABLET BY MOUTH DAILY 01/30/17   Clent Demark, PA-C  montelukast (SINGULAIR) 10 MG tablet Take 1 tablet (10 mg total) by mouth at bedtime. 08/21/16   Clent Demark, PA-C  omeprazole (PRILOSEC) 40 MG capsule Take 1 capsule (40 mg total) by mouth daily. 03/13/17   Argentina Donovan, PA-C  prasugrel (EFFIENT) 10 MG TABS tablet Take 1 tablet (10 mg total) by mouth daily. 07/14/13   Patrecia Pour, NP  predniSONE (DELTASONE) 10 MG tablet 6,5,4,3,2,1 take each days dose in the morning with food 03/13/17   Freeman Caldron M, PA-C  sodium chloride (OCEAN) 0.65 % nasal spray Place 1 spray into the nose as needed for congestion. 08/05/16   Clent Demark, PA-C  traMADol (ULTRAM) 50 MG tablet Take 1 tablet (50 mg total) by mouth every 12 (twelve) hours as needed. 10/31/16   Argentina Donovan, PA-C  VENTOLIN HFA 108 (90 Base) MCG/ACT inhaler INHALE 2 PUFFS INTO THE LUNGS EVERY 4 HOURS AS NEEDED FOR WHEEZING OR SHORTNESS OF BREATH. 01/30/17   Clent Demark, PA-C  Vitamin D, Ergocalciferol, (DRISDOL) 50000 units CAPS capsule TAKE 1 CAPSULE BY MOUTH EVERY 7 DAYS. 06/29/16   Maren Reamer, MD    Physical Exam: Vitals:   03/25/17 1330 03/25/17 1345 03/25/17 1400 03/25/17 1415  BP: 138/83 (!) 143/90 (!) 149/81 (!) 149/79  Pulse: 73 72 72 72  Resp: 12 19 14 11   Temp:      TempSrc:      SpO2:   (!) 88%  90%  Weight:      Height:         General:  Appears calm and comfortable Eyes:  PERRL, EOMI, normal lids, iris ENT:  grossly normal hearing, lips & tongue, mmm Neck:  no LAD, masses or thyromegaly Cardiovascular:  RRR, no m/r/g. Trace LE edema.  Respiratory:  CTA bilaterally, no w/r/r. Normal respiratory effort. Abdomen:  soft, ntnd, NABS Skin:  no rash or induration seen on limited exam Musculoskeletal:  grossly normal tone BUE/BLE, good ROM, no bony abnormality Psychiatric:  grossly normal mood and affect, speech fluent and appropriate, AOx3 Neurologic:  CN 2-12 grossly intact, moves all extremities in coordinated fashion, sensation intact  Labs on Admission: I have personally reviewed following labs and imaging studies  CBC:  Recent Labs Lab 03/25/17 1137  WBC 11.6*  HGB 14.1  HCT 42.8  MCV 90.9  PLT 096   Basic Metabolic Panel:  Recent Labs Lab 03/25/17 1137  NA 137  K 4.0  CL 103  CO2 25  GLUCOSE 108*  BUN 14  CREATININE 0.90  CALCIUM 9.2   GFR: Estimated Creatinine Clearance: 58.9 mL/min (by C-G formula based on SCr of 0.9 mg/dL). Liver Function Tests: No results for input(s): AST, ALT, ALKPHOS, BILITOT, PROT, ALBUMIN in the last 168 hours. No results for input(s): LIPASE, AMYLASE in the last 168 hours. No results for input(s): AMMONIA in the last 168 hours. Coagulation Profile: No results for input(s): INR, PROTIME in the last 168 hours. Cardiac Enzymes: No results for input(s): CKTOTAL, CKMB, CKMBINDEX, TROPONINI in the last 168 hours. BNP (last 3 results) No results for input(s): PROBNP in the last 8760 hours. HbA1C: No results for input(s): HGBA1C in the last 72 hours. CBG: No results for input(s): GLUCAP in the last 168 hours. Lipid Profile: No results for  input(s): CHOL, HDL, LDLCALC, TRIG, CHOLHDL, LDLDIRECT in the last 72 hours. Thyroid Function Tests: No results for input(s): TSH, T4TOTAL, FREET4, T3FREE, THYROIDAB in the last  72 hours. Anemia Panel: No results for input(s): VITAMINB12, FOLATE, FERRITIN, TIBC, IRON, RETICCTPCT in the last 72 hours. Urine analysis:    Component Value Date/Time   COLORURINE YELLOW 08/09/2014 1028   APPEARANCEUR CLEAR 08/09/2014 1028   LABSPEC 1.012 08/09/2014 1028   PHURINE 5.5 08/09/2014 1028   GLUCOSEU NEG 08/09/2014 1028   HGBUR NEG 08/09/2014 1028   BILIRUBINUR NEG 08/09/2014 1028   KETONESUR NEG 08/09/2014 1028   PROTEINUR NEG 08/09/2014 1028   UROBILINOGEN 0.2 08/09/2014 1028   NITRITE NEG 08/09/2014 1028   LEUKOCYTESUR NEG 08/09/2014 1028    Creatinine Clearance: Estimated Creatinine Clearance: 58.9 mL/min (by C-G formula based on SCr of 0.9 mg/dL).  Sepsis Labs: @LABRCNTIP (procalcitonin:4,lacticidven:4) )No results found for this or any previous visit (from the past 240 hour(s)).   Radiological Exams on Admission: Dg Chest 2 View  Result Date: 03/25/2017 CLINICAL DATA:  Right-sided chest pain. EXAM: CHEST  2 VIEW COMPARISON:  February 21, 2016. FINDINGS: The cardiomediastinal silhouette is normal in size. Normal pulmonary vascularity. Atherosclerotic calcification of the aortic arch. The lungs are hyperinflated. Unchanged streaky atelectasis/scarring at the left lung base. No focal consolidation, pleural effusion, or pneumothorax. No acute osseous abnormality. IMPRESSION: COPD.  No active cardiopulmonary disease. Electronically Signed   By: Titus Dubin M.D.   On: 03/25/2017 13:03    EKG: Independently reviewed.   Assessment/Plan Active Problems:   Hypothyroidism   TOBACCO ABUSE   Essential hypertension, benign   Dyslipidemia   CAD S/P percutaneous coronary angioplasty   Anxiety and depression   COPD (chronic obstructive pulmonary disease) (HCC)   Chest pain   CP: Appears to be cardiac in nature. Exertional. Relieved w/ ASA and rest. H/o CAD/Stent placement. Sx similar to previous. Uses cocaine ~3x yearly (family does not know of pts behavior). Last  usage 3 days prior to admission.  - Continue statin, bblocker, Effient - cycle trop, Telemetry monitoring - Echo - Cardiology consult pending - anticipate stress test vs cath.  HTN: - continue labetalol (may need to DC if continues cocaine on regular basis), losartan,   COPD: Advanced. Treated for recent exacerbation. Pt feels she's never fully recovered.  - continue Singulair - Decadron IV 10mg  x1 - DUonebs Q6  CHronic pain: - continue flexeril, neurontin  Hypothyroid: - continue synthroid  GERD:  - continue PPI  Depression: - continue Lexapro  DVT prophylaxis: Hep  Code Status: full  Family Communication: none  Disposition Plan: pending ACS workup and treatment by cardiology  Consults called: Cardiology  Admission status: observation    MERRELL, DAVID J MD [facility]  If 7PM-7AM, please contact night-coverage [email or web address] Password [facility]  03/25/2017, 3:04 PM

## 2017-03-25 NOTE — ED Triage Notes (Signed)
Per [facility]: Pt to ED from home c/o sudden onset chest pressure at 10am while walking outside to the dumpster. Pt hx MI 6 years ago, states this does not feel like that, but it scared her. Pt took 1 325 ASA, lied down on couch and pressure subsided. Pt has COPD and asthma and states she's had a cough x 3 weeks with exertional dyspnea, went to [facility] and was given steroid and antibiotic, but with not much relief. Patient also c/o dizziness upon standing and ambulating, but has steady gait. EMS VS: NSR 87, 142/100. Resp e/u, skin warm/dry.

## 2017-03-25 NOTE — ED Provider Notes (Signed)
[facility] DEPT Provider Note   CSN: [id number] Arrival date & time: 03/25/17  1131     History   Chief Complaint Chief Complaint  Patient presents with  . Chest Pain    HPI  [name] [name] is a 61 y.o. Female with a history of CAD, previous MI 6 years ago, hypertension, hyperlipidemia, COPD, who presents after sudden onset chest pressure at 10 AM while walking outside to the dumpster. Patient describes pain as constant pressure, pain was relieved after she took 325 of aspirin and lay down on the couch. Patient reports the pain radiated to her jaw, neck, and right arm, she states this was similar to her previous anginal pain. She reports a second episode of chest pain that resolved after a few minutes with EMS in transit. She reports she has not had chest pain like this since her previous MI. She reports she has not been taking her heart medications for the past 2 months, and has not taken her labetalol in the past 2 days. Patient reports using cocaine on Sunday night, no chest pain at that time. Patient is an active smoker currently smokes approximately one pack per day, was seen about 3 weeks ago for a COPD exacerbation at urgent care and was given a course of steroids and antibiotics which she completed, but reports she feels her breathing still isn't totally back to baseline. Patient had stents placed in the RCA, LAD and diagonal branch, and has history of restenosis of stents, last catheterization was 01/2010, nuclear stress test in 2015 was nonischemic. She is seen Dr. Claiborne Billings with cardiology, does not regularly follow up with him.       Past Medical History:  Diagnosis Date  . Anxiety    takes Lexapro daily  . Arthritis   . Asthma   . Bartholin gland cyst 08/29/2011  . Bronchitis    couple of months ago  . Bruises easily    pt is on Effient  . Chronic back pain    herniated nucleus pulposus  . COPD (chronic obstructive pulmonary disease) ([place])    early stages  . Coronary  artery disease   . Depression    takes Klonopin daily  . Diverticulosis   . GERD (gastroesophageal reflux disease)    takes Nexium daily  . H/O hiatal hernia   . Heart attack ([place]) 2011  . Hemorrhoids   . Hernia   . Hyperlipidemia    takes Lipitor daily  . Hypertension    takes Losartan daily and Labetalol bid  . Hypothyroidism    takes Synthroid daily  . Insomnia    hydroxyzine prn  . Joint pain   . Neck pain   . Psoriasis    elbows,knees,back  . Shortness of breath    with exertion  . Slowing of urinary stream   . Thyroid disease     Patient Active Problem List   Diagnosis Date Noted  . Perforated eardrum 09/11/2015  . Encounter for screening mammogram for breast cancer 09/11/2015  . Psoriasis 06/22/2015  . COPD (chronic obstructive pulmonary disease) ([place]) 05/18/2015  . Joint swelling, finger, hand 08/09/2014  . Anxiety and depression 07/10/2013  . Essential hypertension, benign 06/07/2013  . Dyslipidemia 06/07/2013  . Asthma, chronic 06/07/2013  . Emphysema lung ([place]) 06/07/2013  . Chronic back pain 06/07/2013  . CAD S/P percutaneous coronary angioplasty 06/07/2013  . GERD (gastroesophageal reflux disease) 06/07/2013  . Breast lump on left side at 3 o'clock position 10/13/2012  .  Bartholin gland cyst 08/29/2011  . S/P abdominal hysterectomy and right salpingo-oophorectomy 08/29/2011  . ABDOMINAL PAIN, LEFT LOWER QUADRANT 03/28/2010  . NONSPECIFIC ABN FINDING RAD & OTH EXAM GI TRACT 03/28/2010  . COPD exacerbation ([place]) 09/15/2009  . TOBACCO ABUSE 07/17/2009  . Chronic rhinitis 07/17/2009  . LUNG NODULE 07/17/2009  . ALLERGY, FOOD 07/17/2009  . Hypothyroidism 12/22/2008  . COUGH 12/22/2008    Past Surgical History:  Procedure Laterality Date  . ABDOMINAL HYSTERECTOMY  1977  . COLONOSCOPY    . CORONARY ANGIOPLASTY WITH STENT PLACEMENT  2011   x 2  . esophagogastroduodenscoy    . LAPAROSCOPY  1977   exploratory  . LUMBAR LAMINECTOMY/DECOMPRESSION  MICRODISCECTOMY  08/05/2011   Procedure: LUMBAR LAMINECTOMY/DECOMPRESSION MICRODISCECTOMY;  Surgeon: Otilio Connors, MD;  Location: [facility];  Service: Neurosurgery;  Laterality: Right;  Right Lumbar four-five extraforaminal discectomy  . TONSILLECTOMY     as a child    OB History    Gravida Para Term Preterm AB Living   1 1 1  0 0 1   SAB TAB Ectopic Multiple Live Births   0 0 0 0         Home Medications    Prior to Admission medications   Medication Sig Start Date End Date Taking? Authorizing Provider  albuterol (PROVENTIL HFA;VENTOLIN HFA) 108 (90 Base) MCG/ACT inhaler Inhale 2 puffs into the lungs every 4 (four) hours as needed for wheezing or shortness of breath. 09/24/16 10/24/16  Maren Reamer, MD  atorvastatin (LIPITOR) 40 MG tablet Take 1 tablet (40 mg total) by mouth daily. 08/21/16 02/17/17  Clent Demark, PA-C  azithromycin (ZITHROMAX) 250 MG tablet Take 2 tabs today then 1 daily 03/13/17   Freeman Caldron M, PA-C  BREO ELLIPTA 200-25 MCG/INH AEPB INHALE 1 PUFF INTO THE LUNGS DAILY. 01/30/17   Clent Demark, PA-C  Calcium 600 MG tablet Take 600 mg by mouth daily.  07/14/13   Patrecia Pour, NP  clotrimazole (LOTRIMIN) 1 % cream Apply 1 application topically 2 (two) times daily. apply to tips of fingers 06/18/16   Arnoldo Morale, MD  cyclobenzaprine (FLEXERIL) 10 MG tablet Take 1 tablet (10 mg total) by mouth 3 (three) times daily as needed. 03/13/17   Argentina Donovan, PA-C  escitalopram (LEXAPRO) 10 MG tablet Take 1 tablet (10 mg total) by mouth daily. 08/21/16 02/17/17  Clent Demark, PA-C  fluocinolone (VANOS) 0.01 % cream Apply topically 2 (two) times daily. 06/18/16   Arnoldo Morale, MD  fluticasone (FLONASE) 50 MCG/ACT nasal spray Place 2 sprays into both nostrils at bedtime. 04/16/16   Langeland, Dawn T, MD  fluticasone furoate-vilanterol (BREO ELLIPTA) 200-25 MCG/INH AEPB Inhale 1 puff into the lungs daily. 09/24/16   Maren Reamer, MD  furosemide (LASIX) 20  MG tablet TAKE 1 TABLET BY MOUTH DAILY. 03/18/17   Tresa Garter, MD  gabapentin (NEURONTIN) 600 MG tablet TAKE 1 TABLET BY MOUTH 4 TIMES DAILY 03/13/17   Freeman Caldron M, PA-C  hydrOXYzine (VISTARIL) 25 MG capsule TAKE 1 CAPSULE BY MOUTH 3 TIMES DAILY AS NEEDED. 03/18/17   Tresa Garter, MD  ipratropium-albuterol (DUONEB) 0.5-2.5 (3) MG/3ML SOLN Take 3 mLs by nebulization every 6 (six) hours as needed. 03/07/17   Janne Napoleon, NP  labetalol (NORMODYNE) 100 MG tablet TAKE 1 TABLET BY MOUTH 2 TIMES DAILY. 03/18/17   Tresa Garter, MD  levothyroxine (SYNTHROID, LEVOTHROID) 100 MCG tablet Take 1 tablet (100 mcg total) by  mouth daily. 03/04/17   Tresa Garter, MD  losartan (COZAAR) 50 MG tablet TAKE 1 TABLET BY MOUTH DAILY 01/30/17   Clent Demark, PA-C  montelukast (SINGULAIR) 10 MG tablet Take 1 tablet (10 mg total) by mouth at bedtime. 08/21/16   Clent Demark, PA-C  omeprazole (PRILOSEC) 40 MG capsule Take 1 capsule (40 mg total) by mouth daily. 03/13/17   Argentina Donovan, PA-C  prasugrel (EFFIENT) 10 MG TABS tablet Take 1 tablet (10 mg total) by mouth daily. 07/14/13   Patrecia Pour, NP  predniSONE (DELTASONE) 10 MG tablet 6,5,4,3,2,1 take each days dose in the morning with food 03/13/17   Freeman Caldron M, PA-C  sodium chloride (OCEAN) 0.65 % nasal spray Place 1 spray into the nose as needed for congestion. 08/05/16   Clent Demark, PA-C  traMADol (ULTRAM) 50 MG tablet Take 1 tablet (50 mg total) by mouth every 12 (twelve) hours as needed. 10/31/16   Argentina Donovan, PA-C  VENTOLIN HFA 108 (90 Base) MCG/ACT inhaler INHALE 2 PUFFS INTO THE LUNGS EVERY 4 HOURS AS NEEDED FOR WHEEZING OR SHORTNESS OF BREATH. 01/30/17   Clent Demark, PA-C  Vitamin D, Ergocalciferol, (DRISDOL) 50000 units CAPS capsule TAKE 1 CAPSULE BY MOUTH EVERY 7 DAYS. 06/29/16   Maren Reamer, MD    Family History Family History  Problem Relation Age of Onset  . Heart disease Mother     . Hypertension Mother   . Stroke Mother   . Heart disease Father   . Hypertension Father   . Diabetes Father   . Cancer Maternal Aunt        breast  . Thyroid disease Paternal Aunt   . Heart disease Maternal Grandfather   . Anesthesia problems Daughter   . Cancer Paternal Grandmother        mouth  . Hypotension Neg Hx   . Malignant hyperthermia Neg Hx   . Pseudochol deficiency Neg Hx     Social History Social History  Substance Use Topics  . Smoking status: Current Every Day Smoker    Packs/day: 1.00    Years: 44.00    Types: Cigarettes  . Smokeless tobacco: Never Used  . Alcohol use Not on file     Allergies   Other; Codeine; and Latex   Review of Systems Review of Systems  Constitutional: Negative for chills and fever.  HENT: Negative for congestion, ear pain, rhinorrhea and sore throat.   Eyes: Negative for photophobia and visual disturbance.  Respiratory: Positive for cough, chest tightness, shortness of breath and wheezing.   Cardiovascular: Positive for chest pain. Negative for palpitations and leg swelling.  Gastrointestinal: Negative for abdominal pain, nausea and vomiting.  Genitourinary: Negative for difficulty urinating and dysuria.  Musculoskeletal: Negative for back pain and myalgias.  Skin: Negative for pallor and rash.  Neurological: Positive for dizziness. Negative for weakness, light-headedness, numbness and headaches.     Physical Exam Updated Vital Signs BP (!) 156/106 (BP Location: Right Arm)   Pulse 81   Temp 98.5 F (36.9 C) (Oral)   Ht 5' 1.5" (1.562 m)   Wt 66.7 kg (147 lb)   SpO2 96%   BMI 27.33 kg/m   Physical Exam  Constitutional: She appears well-developed and well-nourished. No distress.  HENT:  Head: Normocephalic and atraumatic.  Eyes: Pupils are equal, round, and reactive to light. EOM are normal. Right eye exhibits no discharge. Left eye exhibits no discharge.  Neck: Neck supple.  No JVD present. No tracheal deviation  present.  Cardiovascular: Normal rate, regular rhythm, normal heart sounds and intact distal pulses.   Pulmonary/Chest: Effort normal and breath sounds normal. No stridor. No respiratory distress.  Abdominal: Soft. Bowel sounds are normal. She exhibits no distension. There is no tenderness. There is no guarding.  Musculoskeletal: She exhibits no edema or deformity.  Neurological: She is alert. Coordination normal.  Speech is clear, able to follow commands CN III-XII intact Normal strength in upper and lower extremities bilaterally including dorsiflexion and plantar flexion, strong and equal grip strength Sensation normal to light and sharp touch Moves extremities without ataxia, coordination intact  Skin: Skin is warm and dry. Capillary refill takes less than 2 seconds. She is not diaphoretic.  Psychiatric: She has a normal mood and affect. Her behavior is normal.  Nursing note and vitals reviewed.    ED Treatments / Results  Labs (all labs ordered are listed, but only abnormal results are displayed) Labs Reviewed  BASIC METABOLIC PANEL - Abnormal; Notable for the following:       Result Value   Glucose, Bld 108 (*)    All other components within normal limits  CBC - Abnormal; Notable for the following:    WBC 11.6 (*)    RDW 16.4 (*)    All other components within normal limits  I-STAT TROPONIN, ED    EKG  EKG Interpretation  Date/Time:  Tuesday March 25 2017 11:33:08 EDT Ventricular Rate:  78 PR Interval:    QRS Duration: 83 QT Interval:  412 QTC Calculation: 470 R Axis:   90 Text Interpretation:  Sinus rhythm Atrial premature complex Borderline right axis deviation Borderline T abnormalities, lateral leads T wave changes less prominent compared to [facility]pri l2017 Confirmed by Sherwood Gambler [id number]) on 03/25/2017 11:38:41 AM       Radiology Dg Chest 2 View  Result Date: 03/25/2017 CLINICAL DATA:  Right-sided chest pain. EXAM: CHEST  2 VIEW COMPARISON:  February 21, 2016. FINDINGS: The cardiomediastinal silhouette is normal in size. Normal pulmonary vascularity. Atherosclerotic calcification of the aortic arch. The lungs are hyperinflated. Unchanged streaky atelectasis/scarring at the left lung base. No focal consolidation, pleural effusion, or pneumothorax. No acute osseous abnormality. IMPRESSION: COPD.  No active cardiopulmonary disease. Electronically Signed   By: Titus Dubin M.D.   On: 03/25/2017 13:03    Procedures Procedures (including critical care time)  Medications Ordered in ED Medications - No data to display   Initial Impression / Assessment and Plan / ED Course  I have reviewed the triage vital signs and the nursing notes.  Pertinent labs & imaging results that were available during my care of the patient were reviewed by me and considered in my medical decision making (see chart for details).  Patient presents with exertional chest pain, BP mildly hypertensive, vitals otherwise normal on initial eval. Story concerning for ACS, patient continues to have short episodes of CP in the ED. Received ASA prior to arrival, PRN SL Nitro ordered. Initial EKG, trop and CXR unremarkable. Slight leukocytosis, other labs unremarkablePrevious MI 6 years ago, has not had chest pain since then until today. Last cath 2011, last stress test in 2015. Consult to hospitalists for admission for CP, will consult cardiology for recommendations.  Cardiology saw patient and they recommend admission overnight, with plans for cath in the morning.Troponins have been negative thus far so no heparin needed unless trops turn positive. The patient will be admitted to the [facility] Medicine teaching  service.  Final Clinical Impressions(s) / ED Diagnoses   Final diagnoses:  Chest pain, exertional    New Prescriptions New Prescriptions   No medications on file     Janet Berlin 03/25/17 2043    Sherwood Gambler, MD 03/28/17 1242

## 2017-03-25 NOTE — ED Notes (Signed)
Patient ambulatory to restroom and back with steady gait. Denies dizziness this time with ambulation. Patient reports feeling slightly better than when she first came in. Pt now sitting up in bed, eating lunch.

## 2017-03-25 NOTE — Consult Note (Signed)
Cardiology Consultation:   Patient ID: [name]; [id number]; July 28, 1955   Admit date: 03/25/2017 Date of Consult: 03/25/2017  Primary Care Provider: Maren Reamer, MD Primary Cardiologist: Dr. Claiborne Billings Primary Electrophysiologist:     Patient Profile:   [name] [name] is a 61 y.o. female with a hx of CAD s/p PCI, HTN, HLD, illicit drug use (crack), hypothyroidism, psoriasis, and COPD who is being seen today for the evaluation of chest pain at the request of Dr. Marily Memos.  History of Present Illness:   [name] [name] has a history of CAD and is s/p DES to RCA in 2001 with staged intervention to proximal and mid LAD with bare metal stents, cutting balloon arthrotomy to the ostium of her diagonal. 01/2010 she developed in-stent restenosis of the RCA with placement of overlapping DES stents.  She was transitioned from plavix to effient.  She was last seen in clinic by Dr. Claiborne Billings 08/17/13. At that time, she was still experiencing chest pain and was scheduled for OP myoview, which showed no evidence of reversible ischemia. No further workup was completed at that time. It was mentioned in his note that if effient was transitioned back to plavix, she would need P2Y12 testing. She was not seen in clinic after this visit.   She was recently seen in urgent care with a shortness of breath and wheezing in the setting of chronic asthma/COPD and current smoker status. She was given steroids and duo nebs. She denied chest pain at that visit (03/07/17). She has also had a significant mental health history with previous hospitalizations for depression and suicidal ideation.   She reported to the [facility] with onset of chest pain today 03/25/17. She was outside walking taking out the trash when she developed chest pain in the substernal area that was described as a constant pressure and rated as a 8/10 and associated with SOB. She called EMS who administered an ASA 325. ASA and rest on the cough relieved her chest pain.  The pain radiated to her jaw and right arm and ws similar in qulaity as her previous MI's. She has not taken her antihypertensives over the past 3 days and has been out of effient for 3 months. She states that [facility] heartcare instructed her to at least take 81 mg ASA while she was out of effient.  She states that she has only taken ASA and lipitor consistently over the past 3 months.   Nitro paste has been applied and she states the chest pain is relieved. She denies DOE, palpitations, diaphoresis, dizziness, and feelings of syncope.    Past Medical History:  Diagnosis Date  . Anxiety    takes Lexapro daily  . Arthritis   . Asthma   . Bartholin gland cyst 08/29/2011  . Bronchitis    couple of months ago  . Bruises easily    pt is on Effient  . Chronic back pain    herniated nucleus pulposus  . COPD (chronic obstructive pulmonary disease) ([place])    early stages  . Coronary artery disease   . Depression    takes Klonopin daily  . Diverticulosis   . GERD (gastroesophageal reflux disease)    takes Nexium daily  . H/O hiatal hernia   . Heart attack ([place]) 2011  . Hemorrhoids   . Hernia   . Hyperlipidemia    takes Lipitor daily  . Hypertension    takes Losartan daily and Labetalol bid  . Hypothyroidism    takes Synthroid  daily  . Insomnia    hydroxyzine prn  . Joint pain   . Neck pain   . Psoriasis    elbows,knees,back  . Shortness of breath    with exertion  . Slowing of urinary stream   . Thyroid disease     Past Surgical History:  Procedure Laterality Date  . ABDOMINAL HYSTERECTOMY  1977  . COLONOSCOPY    . CORONARY ANGIOPLASTY WITH STENT PLACEMENT  2011   x 2  . esophagogastroduodenscoy    . LAPAROSCOPY  1977   exploratory  . LUMBAR LAMINECTOMY/DECOMPRESSION MICRODISCECTOMY  08/05/2011   Procedure: LUMBAR LAMINECTOMY/DECOMPRESSION MICRODISCECTOMY;  Surgeon: Otilio Connors, MD;  Location: [facility];  Service: Neurosurgery;  Laterality: Right;  Right Lumbar  four-five extraforaminal discectomy  . TONSILLECTOMY     as a child     Home Medications:  Prior to Admission medications   Medication Sig Start Date End Date Taking? Authorizing Provider  albuterol (PROVENTIL HFA;VENTOLIN HFA) 108 (90 Base) MCG/ACT inhaler Inhale 2 puffs into the lungs every 4 (four) hours as needed for wheezing or shortness of breath. 09/24/16 03/25/17 Yes Langeland, Dawn T, MD  atorvastatin (LIPITOR) 40 MG tablet Take 1 tablet (40 mg total) by mouth daily. 08/21/16 03/25/17 Yes Clent Demark, PA-C  Calcium 600 MG tablet Take 600 mg by mouth daily.  07/14/13  Yes Patrecia Pour, NP  clotrimazole (LOTRIMIN) 1 % cream Apply 1 application topically 2 (two) times daily. apply to tips of fingers 06/18/16  Yes Amao, Charlane Ferretti, MD  cyclobenzaprine (FLEXERIL) 10 MG tablet Take 1 tablet (10 mg total) by mouth 3 (three) times daily as needed. Patient taking differently: Take 10 mg by mouth 3 (three) times daily as needed for muscle spasms.  03/13/17  Yes Freeman Caldron M, PA-C  escitalopram (LEXAPRO) 10 MG tablet Take 1 tablet (10 mg total) by mouth daily. 08/21/16 03/25/17 Yes Clent Demark, PA-C  fluocinolone (VANOS) 0.01 % cream Apply topically 2 (two) times daily. 06/18/16  Yes Arnoldo Morale, MD  fluticasone (FLONASE) 50 MCG/ACT nasal spray Place 2 sprays into both nostrils at bedtime. 04/16/16  Yes Langeland, Dawn T, MD  fluticasone furoate-vilanterol (BREO ELLIPTA) 200-25 MCG/INH AEPB Inhale 1 puff into the lungs daily. 09/24/16  Yes Langeland, Dawn T, MD  furosemide (LASIX) 20 MG tablet TAKE 1 TABLET BY MOUTH DAILY. 03/18/17  Yes Tresa Garter, MD  gabapentin (NEURONTIN) 600 MG tablet TAKE 1 TABLET BY MOUTH 4 TIMES DAILY 03/13/17  Yes McClung, Angela M, PA-C  hydrOXYzine (VISTARIL) 25 MG capsule TAKE 1 CAPSULE BY MOUTH 3 TIMES DAILY AS NEEDED. Patient taking differently: TAKE 25MG  BY MOUTH THREE TIMES DAILY 03/18/17  Yes Jegede, Olugbemiga E, MD  ipratropium-albuterol (DUONEB)  0.5-2.5 (3) MG/3ML SOLN Take 3 mLs by nebulization every 6 (six) hours as needed. Patient taking differently: Take 3 mLs by nebulization every 6 (six) hours as needed (wheezing).  03/07/17  Yes Mabe, David, NP  labetalol (NORMODYNE) 100 MG tablet TAKE 1 TABLET BY MOUTH 2 TIMES DAILY. 03/18/17  Yes Tresa Garter, MD  levothyroxine (SYNTHROID, LEVOTHROID) 100 MCG tablet Take 1 tablet (100 mcg total) by mouth daily. 03/04/17  Yes Tresa Garter, MD  losartan (COZAAR) 50 MG tablet TAKE 1 TABLET BY MOUTH DAILY 01/30/17  Yes Clent Demark, PA-C  montelukast (SINGULAIR) 10 MG tablet Take 1 tablet (10 mg total) by mouth at bedtime. 08/21/16  Yes Clent Demark, PA-C  omeprazole (PRILOSEC) 40 MG capsule  Take 1 capsule (40 mg total) by mouth daily. 03/13/17  Yes Freeman Caldron M, PA-C  prasugrel (EFFIENT) 10 MG TABS tablet Take 1 tablet (10 mg total) by mouth daily. 07/14/13  Yes Patrecia Pour, NP  sodium chloride (OCEAN) 0.65 % nasal spray Place 1 spray into the nose as needed for congestion. 08/05/16  Yes Clent Demark, PA-C  traMADol (ULTRAM) 50 MG tablet Take 1 tablet (50 mg total) by mouth every 12 (twelve) hours as needed. Patient taking differently: Take 50 mg by mouth every 12 (twelve) hours as needed for moderate pain.  10/31/16  Yes McClung, Dionne Bucy, PA-C  Vitamin D, Ergocalciferol, (DRISDOL) 50000 units CAPS capsule TAKE 1 CAPSULE BY MOUTH EVERY 7 DAYS. 06/29/16  Yes Maren Reamer, MD  azithromycin (ZITHROMAX) 250 MG tablet Take 2 tabs today then 1 daily Patient not taking: Reported on 03/25/2017 03/13/17   Argentina Donovan, PA-C  BREO ELLIPTA 200-25 MCG/INH AEPB INHALE 1 PUFF INTO THE LUNGS DAILY. Patient not taking: Reported on 03/25/2017 01/30/17   Clent Demark, PA-C  predniSONE (DELTASONE) 10 MG tablet 6,5,4,3,2,1 take each days dose in the morning with food Patient not taking: Reported on 03/25/2017 03/13/17   Argentina Donovan, PA-C  VENTOLIN HFA 108 (90 Base)  MCG/ACT inhaler INHALE 2 PUFFS INTO THE LUNGS EVERY 4 HOURS AS NEEDED FOR WHEEZING OR SHORTNESS OF BREATH. Patient not taking: Reported on 03/25/2017 01/30/17   Clent Demark, PA-C    Inpatient Medications: Scheduled Meds: . atorvastatin  40 mg Oral Daily  . escitalopram  10 mg Oral Daily  . fluticasone furoate-vilanterol  1 puff Inhalation Daily  . [START ON 03/26/2017] furosemide  20 mg Oral Daily  . gabapentin  600 mg Oral Q6H  . ipratropium-albuterol  3 mL Nebulization Q6H  . labetalol  100 mg Oral BID  . levothyroxine  100 mcg Oral Daily  . losartan  50 mg Oral Daily  . montelukast  10 mg Oral QHS  . pantoprazole  80 mg Oral Daily  . prasugrel  10 mg Oral Daily   Continuous Infusions:  PRN Meds: cyclobenzaprine, nitroGLYCERIN  Allergies:    Allergies  Allergen Reactions  . Other Anaphylaxis    avocados  . Codeine Nausea Only  . Latex Rash    REACTION: shortness of breath and rash    Social History:   Social History   Social History  . Marital status: Single    Spouse name: N/A  . Number of children: N/A  . Years of education: N/A   Occupational History  . Not on file.   Social History Main Topics  . Smoking status: Current Every Day Smoker    Packs/day: 1.00    Years: 44.00    Types: Cigarettes  . Smokeless tobacco: Never Used  . Alcohol use Not on file  . Drug use: No     Comment: cocaine use 2 days ago, smokes pot "from time to time"  . Sexual activity: Not Currently    Birth control/ protection: Surgical   Other Topics Concern  . Not on file   Social History Narrative  . No narrative on file    Family History:    Family History  Problem Relation Age of Onset  . Heart disease Mother   . Hypertension Mother   . Stroke Mother   . Heart disease Father   . Hypertension Father   . Diabetes Father   . Cancer Maternal Aunt  breast  . Thyroid disease Paternal Aunt   . Heart disease Maternal Grandfather   . Anesthesia problems  Daughter   . Cancer Paternal Grandmother        mouth  . Hypotension Neg Hx   . Malignant hyperthermia Neg Hx   . Pseudochol deficiency Neg Hx      ROS:  Please see the history of present illness.  ROS  All other ROS reviewed and negative.     Physical Exam/Data:   Vitals:   03/25/17 1315 03/25/17 1330 03/25/17 1345 03/25/17 1400  BP: (!) 142/79 138/83 (!) 143/90 (!) 149/81  Pulse: 72 73 72 72  Resp:  12 19 14   Temp:      TempSrc:      SpO2: 92%  (!) 88%   Weight:      Height:       No intake or output data in the 24 hours ending 03/25/17 1433 Filed Weights   03/25/17 1134  Weight: 147 lb (66.7 kg)   Body mass index is 27.33 kg/m.  General:  Well nourished, well developed, in no acute distress HEENT: normal Lymph: no adenopathy Neck: no JVD Endocrine:  No thryomegaly Vascular: No carotid bruits; FA pulses 2+ bilaterally without bruits  Cardiac:  normal S1, S2; RRR; no murmur, exam difficult with breath sounds Lungs:  Respiration unlabored, nonproductive cough, coarse sounds throughout including expiratory wheezes Abd: soft, nontender, no hepatomegaly  Ext: no edema Musculoskeletal:  No deformities, BUE and BLE strength normal and equal Skin: warm and dry  Psych:  Normal affect   EKG:  The EKG was personally reviewed and demonstrates:  Sinus, prolonged QTc 472 ms Telemetry:  Telemetry was personally reviewed and demonstrates:  NSR  Relevant CV Studies:  Echocardiogram pending  NM stress test 08/17/13: Low risk stress nuclear study with no reversible ischemia. LV Wall Motion:  NL LV Function; NL Wall Motion; EF 78%.   Laboratory Data:  Chemistry Recent Labs Lab 03/25/17 1137  NA 137  K 4.0  CL 103  CO2 25  GLUCOSE 108*  BUN 14  CREATININE 0.90  CALCIUM 9.2  GFRNONAA >60  GFRAA >60  ANIONGAP 9    No results for input(s): PROT, ALBUMIN, AST, ALT, ALKPHOS, BILITOT in the last 168 hours. Hematology Recent Labs Lab 03/25/17 1137  WBC 11.6*    RBC 4.71  HGB 14.1  HCT 42.8  MCV 90.9  MCH 29.9  MCHC 32.9  RDW 16.4*  PLT 345   Cardiac EnzymesNo results for input(s): TROPONINI in the last 168 hours.  Recent Labs Lab 03/25/17 1156  TROPIPOC 0.00    BNPNo results for input(s): BNP, PROBNP in the last 168 hours.  DDimer No results for input(s): DDIMER in the last 168 hours.  Radiology/Studies:  Dg Chest 2 View  Result Date: 03/25/2017 CLINICAL DATA:  Right-sided chest pain. EXAM: CHEST  2 VIEW COMPARISON:  February 21, 2016. FINDINGS: The cardiomediastinal silhouette is normal in size. Normal pulmonary vascularity. Atherosclerotic calcification of the aortic arch. The lungs are hyperinflated. Unchanged streaky atelectasis/scarring at the left lung base. No focal consolidation, pleural effusion, or pneumothorax. No acute osseous abnormality. IMPRESSION: COPD.  No active cardiopulmonary disease. Electronically Signed   By: Titus Dubin M.D.   On: 03/25/2017 13:03    Assessment and Plan:   1. Unstable angina, , hx of CAD s/p PCI to RCA, LAD  - troponin x 1 negative - EKG without clear signs of ischemia - on effient -  has been out of this medication for 3 months Admit overnight for observation and trend troponins. Given her history of stent placement and in-stent restenosis, her typical chest pain symptoms, ongoing tobacco abuse and the fact that she has been out of her cardiac medications for 3 months, progression of CAD is likely.  Will plan for heart cath tomorrow.   2. HTN - home labetalol, losartan - she has been out of these medications for 3 days - she has been hypertensive in the 130-150s in the ED   3. HLD - continue lipitor - she has been compliant on this medication   4. COPD - home inhalers - she has been out of her home inhalers for several days   5. Prolonged QTc - EKG with QTc 472 ms - lexapro, lasix, protonix on MAR - will advise her to see psychiatry OP for transition from lexapro to another  medication that does not prolong QT interval    For questions or updates, please contact [place] Please consult [email or web address] for contact info under Cardiology/STEMI.   Signed, Ledora Bottcher, [place]  03/25/2017 2:33 PM   I have personally seen and examined this patient with Fabian Sharp PA. I agree with the assessment and plan as outlined above. [name] [name] has history of multiple coronary stents including bare metal stents and 1st generation drug eluting stents. She stopped taking her cardiac meds 3-6 months ago and since then has been having exertional chest pain. Severe pain this am prompting call to EMS. Pain resolved with NTG. She currently denies resting chest pain or dyspnea.  Exam: WDWN female in NAD, CV:RRR no loud murmurs. Pulm: diffuse wheezes. Abd: soft, NT, BS present. Ext: no LE edema.  Labs reviewed by me. Creatinine normal. H/H normal.  EKG with sinus rhythm, no ischemic changes.  Plan: She has symptoms c/w unstable angina. She has a history of medication non-compliance. Resume statin, Effient, beta blocker and ARB today. Given her presentation, I would favor cardiac cath to exclude progression of CAD, especially given her 1st generation drug eluting stents and bare metal stents. She has also continued to smoke.  Risks and benefits of cath reviewed with pt. She agree to proceed. Will plan cath tomorrow.   Lauree Chandler 03/25/2017 4:31 PM

## 2017-03-25 NOTE — ED Notes (Signed)
Admitting at bedside 

## 2017-03-26 ENCOUNTER — Observation Stay (HOSPITAL_BASED_OUTPATIENT_CLINIC_OR_DEPARTMENT_OTHER): Payer: Self-pay

## 2017-03-26 ENCOUNTER — Encounter (HOSPITAL_COMMUNITY): Payer: Self-pay | Admitting: Interventional Cardiology

## 2017-03-26 ENCOUNTER — Encounter (HOSPITAL_COMMUNITY)
Admission: EM | Disposition: A | Discharge status: Home / Self Care | Payer: Self-pay | Source: Home / Self Care | Attending: Internal Medicine

## 2017-03-26 DIAGNOSIS — I10 Essential (primary) hypertension: Secondary | ICD-10-CM

## 2017-03-26 DIAGNOSIS — J439 Emphysema, unspecified: Secondary | ICD-10-CM

## 2017-03-26 DIAGNOSIS — R079 Chest pain, unspecified: Secondary | ICD-10-CM | POA: Diagnosis present

## 2017-03-26 HISTORY — PX: LEFT HEART CATH AND CORONARY ANGIOGRAPHY: CATH118249

## 2017-03-26 LAB — ECHOCARDIOGRAM COMPLETE
CHL CUP MV DEC (S): 176
EWDT: 176 ms
Height: 61.5 in
LA vol index: 19.9 mL/m2
LA vol: 34.2 mL
LAVOLA4C: 36.7 mL
LV TDI E'LATERAL: 8.92
LV e' LATERAL: 8.92 cm/s
MVPKEVEL: 1.2 m/s
RV LATERAL S' VELOCITY: 14.1 cm/s
TAPSE: 23.5 mm
TDI e' medial: 8.27
Weight: 2352 oz

## 2017-03-26 LAB — GLUCOSE, CAPILLARY
GLUCOSE-CAPILLARY: 162 mg/dL — AB (ref 65–99)
GLUCOSE-CAPILLARY: 145 mg/dL — AB (ref 65–99)
GLUCOSE-CAPILLARY: 131 mg/dL — AB (ref 65–99)
Glucose-Capillary: 176 mg/dL — ABNORMAL HIGH (ref 65–99)

## 2017-03-26 LAB — CBC
HCT: 40.7 % (ref 36.0–46.0)
Hemoglobin: 13.3 g/dL (ref 12.0–15.0)
MCH: 29.5 pg (ref 26.0–34.0)
MCHC: 32.7 g/dL (ref 30.0–36.0)
MCV: 90.2 fL (ref 78.0–100.0)
Platelets: 329 10*3/uL (ref 150–400)
RBC: 4.51 MIL/uL (ref 3.87–5.11)
RDW: 16.2 % — ABNORMAL HIGH (ref 11.5–15.5)
WBC: 9.9 10*3/uL (ref 4.0–10.5)

## 2017-03-26 LAB — COMPREHENSIVE METABOLIC PANEL
ALBUMIN: 3.3 g/dL — AB (ref 3.5–5.0)
ALK PHOS: 78 U/L (ref 38–126)
ALT: 15 U/L (ref 14–54)
ANION GAP: 5 (ref 5–15)
AST: 15 U/L (ref 15–41)
BUN: 16 mg/dL (ref 6–20)
CALCIUM: 9.2 mg/dL (ref 8.9–10.3)
CHLORIDE: 104 mmol/L (ref 101–111)
CO2: 25 mmol/L (ref 22–32)
Creatinine, Ser: 0.95 mg/dL (ref 0.44–1.00)
GFR calc Af Amer: >60 mL/min (ref >60)
GFR calc non Af Amer: >60 mL/min (ref >60)
GLUCOSE: 176 mg/dL — AB (ref 65–99)
Potassium: 4.4 mmol/L (ref 3.5–5.1)
SODIUM: 134 mmol/L — AB (ref 135–145)
Total Bilirubin: 0.4 mg/dL (ref 0.3–1.2)
Total Protein: 6.3 g/dL — ABNORMAL LOW (ref 6.5–8.1)

## 2017-03-26 LAB — PROTIME-INR
INR: 0.92
Prothrombin Time: 12.3 seconds (ref 11.4–15.2)

## 2017-03-26 LAB — HIV ANTIBODY (ROUTINE TESTING W REFLEX): HIV SCREEN 4TH GENERATION: NONREACTIVE

## 2017-03-26 SURGERY — LEFT HEART CATH AND CORONARY ANGIOGRAPHY
Anesthesia: LOCAL

## 2017-03-26 MED ORDER — LIDOCAINE HCL (PF) 1 % IJ SOLN
INTRAMUSCULAR | Status: DC | PRN
  Administered 2017-03-26: 2 mL

## 2017-03-26 MED ORDER — MIDAZOLAM HCL 2 MG/2ML IJ SOLN
INTRAMUSCULAR | Status: DC | PRN
  Administered 2017-03-26 (×2): 1 mg via INTRAVENOUS

## 2017-03-26 MED ORDER — MELATONIN 3 MG PO TABS
9.0000 mg | ORAL_TABLET | Freq: Every day | ORAL | Status: DC
  Filled 2017-03-26: qty 3

## 2017-03-26 MED ORDER — LABETALOL HCL 5 MG/ML IV SOLN: 10.0000 mg | INTRAVENOUS | Status: AC | PRN

## 2017-03-26 MED ORDER — SODIUM CHLORIDE 0.9 % IV SOLN: INTRAVENOUS | Status: AC

## 2017-03-26 MED ORDER — IOPAMIDOL (ISOVUE-370) INJECTION 76%
INTRAVENOUS | Status: DC | PRN
  Administered 2017-03-26: 75 mL via INTRA_ARTERIAL

## 2017-03-26 MED ORDER — SODIUM CHLORIDE 0.9% FLUSH
3.0000 mL | Freq: Two times a day (BID) | INTRAVENOUS | Status: DC
  Administered 2017-03-26 – 2017-03-27 (×2): 3 mL via INTRAVENOUS

## 2017-03-26 MED ORDER — INFLUENZA VAC SPLIT QUAD 0.5 ML IM SUSY
0.5000 mL | PREFILLED_SYRINGE | INTRAMUSCULAR | Status: AC
  Administered 2017-03-27: 09:00:00 0.5 mL via INTRAMUSCULAR
  Filled 2017-03-26: qty 0.5

## 2017-03-26 MED ORDER — MELATONIN 3 MG PO TABS
9.0000 mg | ORAL_TABLET | Freq: Once | ORAL | Status: AC
  Administered 2017-03-26: 23:00:00 9 mg via ORAL
  Filled 2017-03-26: qty 3

## 2017-03-26 MED ORDER — HEPARIN (PORCINE) IN NACL 2-0.9 UNIT/ML-% IJ SOLN
INTRAMUSCULAR | Status: AC | PRN
  Administered 2017-03-26: 1000 mL

## 2017-03-26 MED ORDER — NITROGLYCERIN 1 MG/10 ML FOR IR/CATH LAB
INTRA_ARTERIAL | Status: DC | PRN
  Administered 2017-03-26: 200 ug via INTRACORONARY

## 2017-03-26 MED ORDER — VERAPAMIL HCL 2.5 MG/ML IV SOLN
INTRAVENOUS | Status: AC
  Filled 2017-03-26: qty 2

## 2017-03-26 MED ORDER — HEPARIN (PORCINE) IN NACL 2-0.9 UNIT/ML-% IJ SOLN
INTRAMUSCULAR | Status: DC | PRN
  Administered 2017-03-26: 10 mL via INTRA_ARTERIAL

## 2017-03-26 MED ORDER — HEPARIN (PORCINE) IN NACL 2-0.9 UNIT/ML-% IJ SOLN
INTRAMUSCULAR | Status: AC
  Filled 2017-03-26: qty 1000

## 2017-03-26 MED ORDER — MIDAZOLAM HCL 2 MG/2ML IJ SOLN
INTRAMUSCULAR | Status: AC
  Filled 2017-03-26: qty 2

## 2017-03-26 MED ORDER — HEPARIN SODIUM (PORCINE) 1000 UNIT/ML IJ SOLN
INTRAMUSCULAR | Status: AC
  Filled 2017-03-26: qty 1

## 2017-03-26 MED ORDER — IOPAMIDOL (ISOVUE-370) INJECTION 76%
INTRAVENOUS | Status: AC
  Filled 2017-03-26: qty 100

## 2017-03-26 MED ORDER — SODIUM CHLORIDE 0.9 % IV SOLN
INTRAVENOUS | Status: AC | PRN
  Administered 2017-03-26: 300 mL via INTRAVENOUS

## 2017-03-26 MED ORDER — SODIUM CHLORIDE 0.9 % IV SOLN: 250.0000 mL | INTRAVENOUS | Status: DC | PRN

## 2017-03-26 MED ORDER — SODIUM CHLORIDE 0.9% FLUSH: 3.0000 mL | INTRAVENOUS | Status: DC | PRN

## 2017-03-26 MED ORDER — ACETAMINOPHEN 325 MG PO TABS: 650.0000 mg | ORAL_TABLET | ORAL | Status: DC | PRN

## 2017-03-26 MED ORDER — NON FORMULARY: 10.0000 mg | Freq: Once | Status: DC

## 2017-03-26 MED ORDER — LIDOCAINE HCL 2 % IJ SOLN
INTRAMUSCULAR | Status: AC
  Filled 2017-03-26: qty 10

## 2017-03-26 MED ORDER — ONDANSETRON HCL 4 MG/2ML IJ SOLN: 4.0000 mg | Freq: Four times a day (QID) | INTRAMUSCULAR | Status: DC | PRN

## 2017-03-26 MED ORDER — TICAGRELOR 90 MG PO TABS
90.0000 mg | ORAL_TABLET | Freq: Two times a day (BID) | ORAL | Status: DC
  Administered 2017-03-27: 09:00:00 90 mg via ORAL
  Filled 2017-03-26: qty 1

## 2017-03-26 MED ORDER — ASPIRIN 81 MG PO CHEW
81.0000 mg | CHEWABLE_TABLET | Freq: Every day | ORAL | Status: DC
  Administered 2017-03-27: 81 mg via ORAL
  Filled 2017-03-26: qty 1

## 2017-03-26 MED ORDER — FENTANYL CITRATE (PF) 100 MCG/2ML IJ SOLN
INTRAMUSCULAR | Status: DC | PRN
  Administered 2017-03-26: 50 ug via INTRAVENOUS

## 2017-03-26 MED ORDER — HEPARIN SODIUM (PORCINE) 1000 UNIT/ML IJ SOLN
INTRAMUSCULAR | Status: DC | PRN
  Administered 2017-03-26: 3500 [IU] via INTRAVENOUS

## 2017-03-26 MED ORDER — FENTANYL CITRATE (PF) 100 MCG/2ML IJ SOLN
INTRAMUSCULAR | Status: AC
  Filled 2017-03-26: qty 2

## 2017-03-26 MED ORDER — IPRATROPIUM-ALBUTEROL 0.5-2.5 (3) MG/3ML IN SOLN: 3.0000 mL | Freq: Three times a day (TID) | RESPIRATORY_TRACT | Status: DC

## 2017-03-26 MED ORDER — HEPARIN SODIUM (PORCINE) 5000 UNIT/ML IJ SOLN
5000.0000 [IU] | Freq: Three times a day (TID) | INTRAMUSCULAR | Status: DC
  Administered 2017-03-26 – 2017-03-27 (×3): 5000 [IU] via SUBCUTANEOUS
  Filled 2017-03-26 (×3): qty 1

## 2017-03-26 MED ORDER — IPRATROPIUM-ALBUTEROL 0.5-2.5 (3) MG/3ML IN SOLN: 3.0000 mL | RESPIRATORY_TRACT | Status: DC | PRN

## 2017-03-26 MED ORDER — HYDRALAZINE HCL 20 MG/ML IJ SOLN: 5.0000 mg | INTRAMUSCULAR | Status: AC | PRN

## 2017-03-26 MED FILL — ?CYCLOBENZAPRINE 10 MG TABL: 10 | 10 days supply | Qty: 30 | Fill #1

## 2017-03-26 MED FILL — !VENTOLIN HFA INHALER: 108 (90 BAS | 16 days supply | Qty: 18 | Fill #1

## 2017-03-26 MED FILL — IPRAT-ALBUT 0.5-3(2.5) MG/3: 0.5-2.5 (3) | 7 days supply | Qty: 90 | Fill #1

## 2017-03-26 SURGICAL SUPPLY — 13 items
CATH INFINITI 5 FR JL3.5 (CATHETERS) ×1 IMPLANT
CATH INFINITI JR4 5F (CATHETERS) ×1 IMPLANT
COVER PRB 48X5XTLSCP FOLD TPE (BAG) IMPLANT
COVER PROBE 5X48 (BAG) ×2
DEVICE RAD COMP TR BAND LRG (VASCULAR PRODUCTS) ×1 IMPLANT
GLIDESHEATH SLEND A-KIT 6F 22G (SHEATH) ×1 IMPLANT
GUIDEWIRE INQWIRE 1.5J.035X260 (WIRE) IMPLANT
INQWIRE 1.5J .035X260CM (WIRE) ×2
KIT HEART LEFT (KITS) ×2 IMPLANT
PACK CARDIAC CATHETERIZATION (CUSTOM PROCEDURE TRAY) ×2 IMPLANT
TRANSDUCER W/STOPCOCK (MISCELLANEOUS) ×2 IMPLANT
TUBING CIL FLEX 10 FLL-RA (TUBING) ×2 IMPLANT
WIRE HI TORQ VERSACORE-J 145CM (WIRE) ×1 IMPLANT

## 2017-03-26 NOTE — Interval H&P Note (Signed)
Cath Lab Visit (complete for each Cath Lab visit)  Clinical Evaluation Leading to the Procedure:   ACS: Yes.    Non-ACS:    Anginal Classification: CCS III  Anti-ischemic medical therapy: Maximal Therapy (2 or more classes of medications)  Non-Invasive Test Results: No non-invasive testing performed  Prior CABG: No previous CABG      History and Physical Interval Note:  03/26/2017 9:19 AM  [name] [name]  has presented today for surgery, with the diagnosis of cp  The various methods of treatment have been discussed with the patient and family. After consideration of risks, benefits and other options for treatment, the patient has consented to  Procedure(s): LEFT HEART CATH AND CORONARY ANGIOGRAPHY (N/A) as a surgical intervention .  The patient's history has been reviewed, patient examined, no change in status, stable for surgery.  I have reviewed the patient's chart and labs.  Questions were answered to the patient's satisfaction.     Belva Crome III

## 2017-03-26 NOTE — H&P (View-Only) (Signed)
Cardiology Consultation:   Patient ID: [name] [name]; [id number]; 03-Apr-1956   Admit date: 03/25/2017 Date of Consult: 03/25/2017  Primary Care Provider: Maren Reamer, MD Primary Cardiologist: Dr. Claiborne Billings Primary Electrophysiologist:     Patient Profile:   [name] [name] is a 61 y.o. female with a hx of CAD s/p PCI, HTN, HLD, illicit drug use (crack), hypothyroidism, psoriasis, and COPD who is being seen today for the evaluation of chest pain at the request of Dr. Marily Memos.  History of Present Illness:   Ms. [name] [name] has a history of CAD and is s/p DES to RCA in 2001 with staged intervention to proximal and mid LAD with bare metal stents, cutting balloon arthrotomy to the ostium of her diagonal. 01/2010 she developed in-stent restenosis of the RCA with placement of overlapping DES stents.  She was transitioned from plavix to effient.  She was last seen in clinic by Dr. Claiborne Billings 08/17/13. At that time, she was still experiencing chest pain and was scheduled for OP myoview, which showed no evidence of reversible ischemia. No further workup was completed at that time. It was mentioned in his note that if effient was transitioned back to plavix, she would need P2Y12 testing. She was not seen in clinic after this visit.   She was recently seen in urgent care with a shortness of breath and wheezing in the setting of chronic asthma/COPD and current smoker status. She was given steroids and duo nebs. She denied chest pain at that visit (03/07/17). She has also had a significant mental health history with previous hospitalizations for depression and suicidal ideation.   She reported to the [facility] with onset of chest pain today 03/25/17. She was outside walking taking out the trash when she developed chest pain in the substernal area that was described as a constant pressure and rated as a 8/10 and associated with SOB. She called EMS who administered an ASA 325. ASA and rest on the cough relieved her chest pain.  The pain radiated to her jaw and right arm and ws similar in qulaity as her previous MI's. She has not taken her antihypertensives over the past 3 days and has been out of effient for 3 months. She states that [facility] heartcare instructed her to at least take 81 mg ASA while she was out of effient.  She states that she has only taken ASA and lipitor consistently over the past 3 months.   Nitro paste has been applied and she states the chest pain is relieved. She denies DOE, palpitations, diaphoresis, dizziness, and feelings of syncope.    Past Medical History:  Diagnosis Date  . Anxiety    takes Lexapro daily  . Arthritis   . Asthma   . Bartholin gland cyst 08/29/2011  . Bronchitis    couple of months ago  . Bruises easily    pt is on Effient  . Chronic back pain    herniated nucleus pulposus  . COPD (chronic obstructive pulmonary disease) ([place])    early stages  . Coronary artery disease   . Depression    takes Klonopin daily  . Diverticulosis   . GERD (gastroesophageal reflux disease)    takes Nexium daily  . H/O hiatal hernia   . Heart attack ([place]) 2011  . Hemorrhoids   . Hernia   . Hyperlipidemia    takes Lipitor daily  . Hypertension    takes Losartan daily and Labetalol bid  . Hypothyroidism    takes Synthroid  daily  . Insomnia    hydroxyzine prn  . Joint pain   . Neck pain   . Psoriasis    elbows,knees,back  . Shortness of breath    with exertion  . Slowing of urinary stream   . Thyroid disease     Past Surgical History:  Procedure Laterality Date  . ABDOMINAL HYSTERECTOMY  1977  . COLONOSCOPY    . CORONARY ANGIOPLASTY WITH STENT PLACEMENT  2011   x 2  . esophagogastroduodenscoy    . LAPAROSCOPY  1977   exploratory  . LUMBAR LAMINECTOMY/DECOMPRESSION MICRODISCECTOMY  08/05/2011   Procedure: LUMBAR LAMINECTOMY/DECOMPRESSION MICRODISCECTOMY;  Surgeon: Otilio Connors, MD;  Location: [place] [facility];  Service: Neurosurgery;  Laterality: Right;  Right Lumbar  four-five extraforaminal discectomy  . TONSILLECTOMY     as a child     Home Medications:  Prior to Admission medications   Medication Sig Start Date End Date Taking? Authorizing Provider  albuterol (PROVENTIL HFA;VENTOLIN HFA) 108 (90 Base) MCG/ACT inhaler Inhale 2 puffs into the lungs every 4 (four) hours as needed for wheezing or shortness of breath. 09/24/16 03/25/17 Yes Langeland, Dawn T, MD  atorvastatin (LIPITOR) 40 MG tablet Take 1 tablet (40 mg total) by mouth daily. 08/21/16 03/25/17 Yes Clent Demark, PA-C  Calcium 600 MG tablet Take 600 mg by mouth daily.  07/14/13  Yes Patrecia Pour, NP  clotrimazole (LOTRIMIN) 1 % cream Apply 1 application topically 2 (two) times daily. apply to tips of fingers 06/18/16  Yes Amao, Charlane Ferretti, MD  cyclobenzaprine (FLEXERIL) 10 MG tablet Take 1 tablet (10 mg total) by mouth 3 (three) times daily as needed. Patient taking differently: Take 10 mg by mouth 3 (three) times daily as needed for muscle spasms.  03/13/17  Yes Freeman Caldron M, PA-C  escitalopram (LEXAPRO) 10 MG tablet Take 1 tablet (10 mg total) by mouth daily. 08/21/16 03/25/17 Yes Clent Demark, PA-C  fluocinolone (VANOS) 0.01 % cream Apply topically 2 (two) times daily. 06/18/16  Yes Arnoldo Morale, MD  fluticasone (FLONASE) 50 MCG/ACT nasal spray Place 2 sprays into both nostrils at bedtime. 04/16/16  Yes Langeland, Dawn T, MD  fluticasone furoate-vilanterol (BREO ELLIPTA) 200-25 MCG/INH AEPB Inhale 1 puff into the lungs daily. 09/24/16  Yes Langeland, Dawn T, MD  furosemide (LASIX) 20 MG tablet TAKE 1 TABLET BY MOUTH DAILY. 03/18/17  Yes Tresa Garter, MD  gabapentin (NEURONTIN) 600 MG tablet TAKE 1 TABLET BY MOUTH 4 TIMES DAILY 03/13/17  Yes McClung, Angela M, PA-C  hydrOXYzine (VISTARIL) 25 MG capsule TAKE 1 CAPSULE BY MOUTH 3 TIMES DAILY AS NEEDED. Patient taking differently: TAKE 25MG  BY MOUTH THREE TIMES DAILY 03/18/17  Yes Jegede, Olugbemiga E, MD  ipratropium-albuterol (DUONEB)  0.5-2.5 (3) MG/3ML SOLN Take 3 mLs by nebulization every 6 (six) hours as needed. Patient taking differently: Take 3 mLs by nebulization every 6 (six) hours as needed (wheezing).  03/07/17  Yes Mabe, David, NP  labetalol (NORMODYNE) 100 MG tablet TAKE 1 TABLET BY MOUTH 2 TIMES DAILY. 03/18/17  Yes Tresa Garter, MD  levothyroxine (SYNTHROID, LEVOTHROID) 100 MCG tablet Take 1 tablet (100 mcg total) by mouth daily. 03/04/17  Yes Tresa Garter, MD  losartan (COZAAR) 50 MG tablet TAKE 1 TABLET BY MOUTH DAILY 01/30/17  Yes Clent Demark, PA-C  montelukast (SINGULAIR) 10 MG tablet Take 1 tablet (10 mg total) by mouth at bedtime. 08/21/16  Yes Clent Demark, PA-C  omeprazole (PRILOSEC) 40 MG capsule  Take 1 capsule (40 mg total) by mouth daily. 03/13/17  Yes Freeman Caldron M, PA-C  prasugrel (EFFIENT) 10 MG TABS tablet Take 1 tablet (10 mg total) by mouth daily. 07/14/13  Yes Patrecia Pour, NP  sodium chloride (OCEAN) 0.65 % nasal spray Place 1 spray into the nose as needed for congestion. 08/05/16  Yes Clent Demark, PA-C  traMADol (ULTRAM) 50 MG tablet Take 1 tablet (50 mg total) by mouth every 12 (twelve) hours as needed. Patient taking differently: Take 50 mg by mouth every 12 (twelve) hours as needed for moderate pain.  10/31/16  Yes McClung, Dionne Bucy, PA-C  Vitamin D, Ergocalciferol, (DRISDOL) 50000 units CAPS capsule TAKE 1 CAPSULE BY MOUTH EVERY 7 DAYS. 06/29/16  Yes Maren Reamer, MD  azithromycin (ZITHROMAX) 250 MG tablet Take 2 tabs today then 1 daily Patient not taking: Reported on 03/25/2017 03/13/17   Argentina Donovan, PA-C  BREO ELLIPTA 200-25 MCG/INH AEPB INHALE 1 PUFF INTO THE LUNGS DAILY. Patient not taking: Reported on 03/25/2017 01/30/17   Clent Demark, PA-C  predniSONE (DELTASONE) 10 MG tablet 6,5,4,3,2,1 take each days dose in the morning with food Patient not taking: Reported on 03/25/2017 03/13/17   Argentina Donovan, PA-C  VENTOLIN HFA 108 (90 Base)  MCG/ACT inhaler INHALE 2 PUFFS INTO THE LUNGS EVERY 4 HOURS AS NEEDED FOR WHEEZING OR SHORTNESS OF BREATH. Patient not taking: Reported on 03/25/2017 01/30/17   Clent Demark, PA-C    Inpatient Medications: Scheduled Meds: . atorvastatin  40 mg Oral Daily  . escitalopram  10 mg Oral Daily  . fluticasone furoate-vilanterol  1 puff Inhalation Daily  . [START ON 03/26/2017] furosemide  20 mg Oral Daily  . gabapentin  600 mg Oral Q6H  . ipratropium-albuterol  3 mL Nebulization Q6H  . labetalol  100 mg Oral BID  . levothyroxine  100 mcg Oral Daily  . losartan  50 mg Oral Daily  . montelukast  10 mg Oral QHS  . pantoprazole  80 mg Oral Daily  . prasugrel  10 mg Oral Daily   Continuous Infusions:  PRN Meds: cyclobenzaprine, nitroGLYCERIN  Allergies:    Allergies  Allergen Reactions  . Other Anaphylaxis    avocados  . Codeine Nausea Only  . Latex Rash    REACTION: shortness of breath and rash    Social History:   Social History   Social History  . Marital status: Single    Spouse name: N/A  . Number of children: N/A  . Years of education: N/A   Occupational History  . Not on file.   Social History Main Topics  . Smoking status: Current Every Day Smoker    Packs/day: 1.00    Years: 44.00    Types: Cigarettes  . Smokeless tobacco: Never Used  . Alcohol use Not on file  . Drug use: No     Comment: cocaine use 2 days ago, smokes pot "from time to time"  . Sexual activity: Not Currently    Birth control/ protection: Surgical   Other Topics Concern  . Not on file   Social History Narrative  . No narrative on file    Family History:    Family History  Problem Relation Age of Onset  . Heart disease Mother   . Hypertension Mother   . Stroke Mother   . Heart disease Father   . Hypertension Father   . Diabetes Father   . Cancer Maternal Aunt  breast  . Thyroid disease Paternal Aunt   . Heart disease Maternal Grandfather   . Anesthesia problems  Daughter   . Cancer Paternal Grandmother        mouth  . Hypotension Neg Hx   . Malignant hyperthermia Neg Hx   . Pseudochol deficiency Neg Hx      ROS:  Please see the history of present illness.  ROS  All other ROS reviewed and negative.     Physical Exam/Data:   Vitals:   03/25/17 1315 03/25/17 1330 03/25/17 1345 03/25/17 1400  BP: (!) 142/79 138/83 (!) 143/90 (!) 149/81  Pulse: 72 73 72 72  Resp:  12 19 14   Temp:      TempSrc:      SpO2: 92%  (!) 88%   Weight:      Height:       No intake or output data in the 24 hours ending 03/25/17 1433 Filed Weights   03/25/17 1134  Weight: 147 lb (66.7 kg)   Body mass index is 27.33 kg/m.  General:  Well nourished, well developed, in no acute distress HEENT: normal Lymph: no adenopathy Neck: no JVD Endocrine:  No thryomegaly Vascular: No carotid bruits; FA pulses 2+ bilaterally without bruits  Cardiac:  normal S1, S2; RRR; no murmur, exam difficult with breath sounds Lungs:  Respiration unlabored, nonproductive cough, coarse sounds throughout including expiratory wheezes Abd: soft, nontender, no hepatomegaly  Ext: no edema Musculoskeletal:  No deformities, BUE and BLE strength normal and equal Skin: warm and dry  Psych:  Normal affect   EKG:  The EKG was personally reviewed and demonstrates:  Sinus, prolonged QTc 472 ms Telemetry:  Telemetry was personally reviewed and demonstrates:  NSR  Relevant CV Studies:  Echocardiogram pending  NM stress test 08/17/13: Low risk stress nuclear study with no reversible ischemia. LV Wall Motion:  NL LV Function; NL Wall Motion; EF 78%.   Laboratory Data:  Chemistry Recent Labs Lab 03/25/17 1137  NA 137  K 4.0  CL 103  CO2 25  GLUCOSE 108*  BUN 14  CREATININE 0.90  CALCIUM 9.2  GFRNONAA >60  GFRAA >60  ANIONGAP 9    No results for input(s): PROT, ALBUMIN, AST, ALT, ALKPHOS, BILITOT in the last 168 hours. Hematology Recent Labs Lab 03/25/17 1137  WBC 11.6*    RBC 4.71  HGB 14.1  HCT 42.8  MCV 90.9  MCH 29.9  MCHC 32.9  RDW 16.4*  PLT 345   Cardiac EnzymesNo results for input(s): TROPONINI in the last 168 hours.  Recent Labs Lab 03/25/17 1156  TROPIPOC 0.00    BNPNo results for input(s): BNP, PROBNP in the last 168 hours.  DDimer No results for input(s): DDIMER in the last 168 hours.  Radiology/Studies:  Dg Chest 2 View  Result Date: 03/25/2017 CLINICAL DATA:  Right-sided chest pain. EXAM: CHEST  2 VIEW COMPARISON:  February 21, 2016. FINDINGS: The cardiomediastinal silhouette is normal in size. Normal pulmonary vascularity. Atherosclerotic calcification of the aortic arch. The lungs are hyperinflated. Unchanged streaky atelectasis/scarring at the left lung base. No focal consolidation, pleural effusion, or pneumothorax. No acute osseous abnormality. IMPRESSION: COPD.  No active cardiopulmonary disease. Electronically Signed   By: Titus Dubin M.D.   On: 03/25/2017 13:03    Assessment and Plan:   1. Unstable angina, , hx of CAD s/p PCI to RCA, LAD  - troponin x 1 negative - EKG without clear signs of ischemia - on effient -  has been out of this medication for 3 months Admit overnight for observation and trend troponins. Given her history of stent placement and in-stent restenosis, her typical chest pain symptoms, ongoing tobacco abuse and the fact that she has been out of her cardiac medications for 3 months, progression of CAD is likely.  Will plan for heart cath tomorrow.   2. HTN - home labetalol, losartan - she has been out of these medications for 3 days - she has been hypertensive in the 130-150s in the ED   3. HLD - continue lipitor - she has been compliant on this medication   4. COPD - home inhalers - she has been out of her home inhalers for several days   5. Prolonged QTc - EKG with QTc 472 ms - lexapro, lasix, protonix on MAR - will advise her to see psychiatry OP for transition from lexapro to another  medication that does not prolong QT interval    For questions or updates, please contact [place] Please consult [email or web address] for contact info under Cardiology/STEMI.   Signed, Ledora Bottcher, [place]  03/25/2017 2:33 PM   I have personally seen and examined this patient with Fabian Sharp PA. I agree with the assessment and plan as outlined above. Ms. Shafran has history of multiple coronary stents including bare metal stents and 1st generation drug eluting stents. She stopped taking her cardiac meds 3-6 months ago and since then has been having exertional chest pain. Severe pain this am prompting call to EMS. Pain resolved with NTG. She currently denies resting chest pain or dyspnea.  Exam: WDWN female in NAD, CV:RRR no loud murmurs. Pulm: diffuse wheezes. Abd: soft, NT, BS present. Ext: no LE edema.  Labs reviewed by me. Creatinine normal. H/H normal.  EKG with sinus rhythm, no ischemic changes.  Plan: She has symptoms c/w unstable angina. She has a history of medication non-compliance. Resume statin, Effient, beta blocker and ARB today. Given her presentation, I would favor cardiac cath to exclude progression of CAD, especially given her 1st generation drug eluting stents and bare metal stents. She has also continued to smoke.  Risks and benefits of cath reviewed with pt. She agree to proceed. Will plan cath tomorrow.   Lauree Chandler 03/25/2017 4:31 PM

## 2017-03-26 NOTE — Progress Notes (Signed)
TR BAND REMOVAL  LOCATION:    right radial  DEFLATED PER PROTOCOL:    Yes.    TIME BAND OFF / DRESSING APPLIED:    1200   SITE UPON ARRIVAL:    Level 0  SITE AFTER BAND REMOVAL:    Level 0  CIRCULATION SENSATION AND MOVEMENT:    Within Normal Limits   Yes.    COMMENTS:   TOLERATED PROCEDURE WELL

## 2017-03-26 NOTE — Progress Notes (Signed)
Pt refused 0400 blood sugar check. States I can check it at 0600.

## 2017-03-26 NOTE — Progress Notes (Signed)
  Echocardiogram 2D Echocardiogram has been performed.  [name] [name] 03/26/2017, 3:10 PM

## 2017-03-26 NOTE — Progress Notes (Signed)
    Patient underwent cath with CTO of the RCA with left to right collaterals. Normal LV function and EDP 25mmHg. She was on effient as an outpatient but reports that she has been unable to fill it. Not able to obtain through the patient assistance program through [facility]. Needs to be on antiplatelet therapy and appears to be a hypo responder to plavix as she had ISR in the past with plavix. Will plan to switch to Brilinta with first dose in the morning. RN to make CM aware for patient assistance. Radial site stable post cath.   Barnet Pall, NP-C 03/26/2017, 4:40 PM Pager: [phone]  Agree with plan as above.   Lauree Chandler 03/26/2017 5:15 PM

## 2017-03-26 NOTE — Progress Notes (Signed)
PROGRESS NOTE    [name]  ALP:[id number] DOB: [birth year] DOA: 03/25/2017 PCP: Maren Reamer, MD   Outpatient Specialists:     Brief Narrative:  [name] [name] is a 61 y.o. female with medical history significant of hypothyroidism, psoriasis, insomnia, hypertension, hyperlipidemia, CAD/MI, COPD,.   Chest pain. Acute onset at approximately 10:00 on day of admission. Occurred while patient was ambulating outside of her home. Pain is described as substernal and constant pressure. Relieved after ASA 325 and resting on her couch. Pain radiated to her jaw and right arm. Patient states that this is similar to her previous heart attack type pain. Patient had recurrence of her pain during transport by EMS. Patient also reports medical noncompliance for the last 2 months and has used cocaine approximately 3 days prior to admission. Patient has not used her antihypertensives for 2 days. Patient continues to smoke one pack of cigars per day. She was treated for COPD exacerbation with antibiotics and steroids approximately 3 weeks prior to admission which has not completely resolved. Patient's last cardiac intervention was in 2011 when she had multiple stents placed. Nuclear stress in 2015 was essentially normal.  Assessment & Plan:   Principal Problem:   CAD S/P percutaneous coronary angioplasty Active Problems:   Hypothyroidism   TOBACCO ABUSE   Essential hypertension, benign   Dyslipidemia   Anxiety and depression   COPD (chronic obstructive pulmonary disease) (HCC)   Chest pain, exertional   QT prolongation   CP:  -CE negative Cath: Aggressive risk factor modification Care management to help with acquiring medication. -unable to afford effient-- will need to be changed to brilenta  HTN: - continue meds  COPD:  -needs to stop smoking - DUonebs Q6  CHronic pain: - continue flexeril, neurontin  Hypothyroid: - continue synthroid  GERD:  - continue  PPI  Depression: - continue Lexapro   DVT prophylaxis:  Fully anticoagulated   Code Status:  Full Code   Family Communication: patient  Disposition Plan:     Consultants:   cards  Procedures:    Subjective: No sob Still smoking  Objective: Vitals:   03/26/17 1000 03/26/17 1030 03/26/17 1500 03/26/17 1508  BP: 109/63 (!) 146/58 (!) 109/57   Pulse: 80 75 76   Resp: (!) 23 18 (!) 22   Temp:  98.4 F (36.9 C) 98.4 F (36.9 C)   TempSrc:  Oral Oral   SpO2: 100% 95%  96%  Weight:      Height:        Intake/Output Summary (Last 24 hours) at 03/26/17 1624 Last data filed at 03/26/17 1415  Gross per 24 hour  Intake             1300 ml  Output             2350 ml  Net            -1050 ml   Filed Weights   03/25/17 1134 03/25/17 2105  Weight: 66.7 kg (147 lb) 66.7 kg (147 lb)    Examination:  General exam: Appears calm and comfortable  Respiratory system: diminished, few wheezing Cardiovascular system: S1 & S2 heard, RRR. No JVD, murmurs, rubs, gallops or clicks. No pedal edema. Gastrointestinal system: Abdomen is nondistended, soft and nontender. No organomegaly or masses felt. Normal bowel sounds heard. Central nervous system: Alert and oriented. No focal neurological deficits.      Data Reviewed: I have personally reviewed following labs and imaging studies  CBC:  Recent Labs Lab 03/25/17 1137 03/26/17 0400  WBC 11.6* 9.9  HGB 14.1 13.3  HCT 42.8 40.7  MCV 90.9 90.2  PLT 345 250   Basic Metabolic Panel:  Recent Labs Lab 03/25/17 1137 03/26/17 0400  NA 137 134*  K 4.0 4.4  CL 103 104  CO2 25 25  GLUCOSE 108* 176*  BUN 14 16  CREATININE 0.90 0.95  CALCIUM 9.2 9.2   GFR: Estimated Creatinine Clearance: 55.8 mL/min (by C-G formula based on SCr of 0.95 mg/dL). Liver Function Tests:  Recent Labs Lab 03/26/17 0400  AST 15  ALT 15  ALKPHOS 78  BILITOT 0.4  PROT 6.3*  ALBUMIN 3.3*   No results for input(s): LIPASE,  AMYLASE in the last 168 hours. No results for input(s): AMMONIA in the last 168 hours. Coagulation Profile:  Recent Labs Lab 03/26/17 0400  INR 0.92   Cardiac Enzymes:  Recent Labs Lab 03/25/17 1509 03/25/17 1836 03/25/17 2154  TROPONINI <0.03 <0.03 <0.03   BNP (last 3 results) No results for input(s): PROBNP in the last 8760 hours. HbA1C: No results for input(s): HGBA1C in the last 72 hours. CBG:  Recent Labs Lab 03/25/17 1841 03/25/17 2238 03/26/17 0550 03/26/17 1121  GLUCAP 157* 203* 162* 176*   Lipid Profile: No results for input(s): CHOL, HDL, LDLCALC, TRIG, CHOLHDL, LDLDIRECT in the last 72 hours. Thyroid Function Tests: No results for input(s): TSH, T4TOTAL, FREET4, T3FREE, THYROIDAB in the last 72 hours. Anemia Panel: No results for input(s): VITAMINB12, FOLATE, FERRITIN, TIBC, IRON, RETICCTPCT in the last 72 hours. Urine analysis:    Component Value Date/Time   COLORURINE YELLOW 08/09/2014 1028   APPEARANCEUR CLEAR 08/09/2014 1028   LABSPEC 1.012 08/09/2014 1028   PHURINE 5.5 08/09/2014 1028   GLUCOSEU NEG 08/09/2014 1028   HGBUR NEG 08/09/2014 1028   BILIRUBINUR NEG 08/09/2014 1028   KETONESUR NEG 08/09/2014 1028   PROTEINUR NEG 08/09/2014 1028   UROBILINOGEN 0.2 08/09/2014 1028   NITRITE NEG 08/09/2014 1028   LEUKOCYTESUR NEG 08/09/2014 1028     )No results found for this or any previous visit (from the past 240 hour(s)).    Anti-infectives    None       Radiology Studies: Dg Chest 2 View  Result Date: 03/25/2017 CLINICAL DATA:  Right-sided chest pain. EXAM: CHEST  2 VIEW COMPARISON:  February 21, 2016. FINDINGS: The cardiomediastinal silhouette is normal in size. Normal pulmonary vascularity. Atherosclerotic calcification of the aortic arch. The lungs are hyperinflated. Unchanged streaky atelectasis/scarring at the left lung base. No focal consolidation, pleural effusion, or pneumothorax. No acute osseous abnormality. IMPRESSION: COPD.  No  active cardiopulmonary disease. Electronically Signed   By: Titus Dubin M.D.   On: 03/25/2017 13:03        Scheduled Meds: . [START ON 03/27/2017] aspirin  81 mg Oral Daily  . atorvastatin  40 mg Oral q1800  . escitalopram  10 mg Oral Daily  . fluticasone furoate-vilanterol  1 puff Inhalation Daily  . furosemide  20 mg Oral Daily  . gabapentin  600 mg Oral QID  . heparin  5,000 Units Subcutaneous Q8H  . [START ON 03/27/2017] Influenza vac split quadrivalent PF  0.5 mL Intramuscular Tomorrow-1000  . ipratropium-albuterol  3 mL Nebulization Q6H  . labetalol  100 mg Oral BID  . levothyroxine  100 mcg Oral QAC breakfast  . losartan  50 mg Oral Daily  . montelukast  10 mg Oral QHS  . nicotine  21  mg Transdermal Daily  . pantoprazole  80 mg Oral Daily  . prasugrel  10 mg Oral Daily  . sodium chloride flush  3 mL Intravenous Q12H  . sodium chloride flush  3 mL Intravenous Q12H   Continuous Infusions: . sodium chloride    . sodium chloride       LOS: 0 days    Time spent: 35 min    Maharishi [place], DO [facility] Pager [phone]  If 7PM-7AM, please contact night-coverage [email or web address] Password [facility] 03/26/2017, 4:24 PM

## 2017-03-26 NOTE — Care Management Note (Addendum)
Case Management Note  Patient Details  Name: [name] [name]   MRN: [id number] Date of Birth: 08/07/1955  Subjective/Objective:  From home, presents with chest pain, for cath lab today, she was taking effient,  She is already established at the [facility] and [facility], her next apt is scheduled for Sept 21 at 11:30.  NCM spoke with the pharmacy at the [facility] clinic, they no longer can get effient on the pass program (patient asst).  Patient also states she can not take plavix.  Per PA, patient will be on brilinta, NCM will give patient 30 day savings card in am and also give her the patient ast application in am.    4/09 [place], BSN - NCM gave patient the 30 day free coupon that she will need to take to [facility] with paper script, NCM also gave her the patient asst application.  She will also need a Match letter , NCM  Gave her the Match letter to help get her other meds.  She has transport at discharge.                Action/Plan: NCM will follow for dc needs.   Expected Discharge Date:                  Expected Discharge Plan:  Home/Self Care  In-House Referral:     Discharge planning Services  CM Consult, Follow-up appt scheduled, [facility]  Post Acute Care Choice:    Choice offered to:     DME Arranged:    DME Agency:     HH Arranged:    HH Agency:     Status of Service:  Completed, signed off  If discussed at [employer] of Stay Meetings, dates discussed:    Additional Comments:  Zenon Mayo, RN 03/26/2017, 9:53 AM

## 2017-03-27 DIAGNOSIS — I2510 Atherosclerotic heart disease of native coronary artery without angina pectoris: Secondary | ICD-10-CM

## 2017-03-27 DIAGNOSIS — Z9861 Coronary angioplasty status: Secondary | ICD-10-CM

## 2017-03-27 LAB — CBC
HEMATOCRIT: 38.6 % (ref 36.0–46.0)
HEMOGLOBIN: 12.9 g/dL (ref 12.0–15.0)
MCH: 30.4 pg (ref 26.0–34.0)
MCHC: 33.4 g/dL (ref 30.0–36.0)
MCV: 91 fL (ref 78.0–100.0)
PLATELETS: 333 10*3/uL (ref 150–400)
RBC: 4.24 MIL/uL (ref 3.87–5.11)
RDW: 16.9 % — ABNORMAL HIGH (ref 11.5–15.5)
WBC: 14.8 10*3/uL — ABNORMAL HIGH (ref 4.0–10.5)

## 2017-03-27 LAB — BASIC METABOLIC PANEL
Anion gap: 4 — ABNORMAL LOW (ref 5–15)
BUN: 11 mg/dL (ref 6–20)
CHLORIDE: 106 mmol/L (ref 101–111)
CO2: 27 mmol/L (ref 22–32)
CREATININE: 0.9 mg/dL (ref 0.44–1.00)
Calcium: 9.1 mg/dL (ref 8.9–10.3)
GFR calc non Af Amer: >60 mL/min (ref >60)
Glucose, Bld: 121 mg/dL — ABNORMAL HIGH (ref 65–99)
Potassium: 4.3 mmol/L (ref 3.5–5.1)
Sodium: 137 mmol/L (ref 135–145)

## 2017-03-27 LAB — GLUCOSE, CAPILLARY: Glucose-Capillary: 111 mg/dL — ABNORMAL HIGH (ref 65–99)

## 2017-03-27 MED ORDER — TICAGRELOR 90 MG PO TABS: 90.0000 mg | ORAL_TABLET | Freq: Two times a day (BID) | ORAL | 0 refills | Status: DC

## 2017-03-27 MED ORDER — ASPIRIN 81 MG PO CHEW: 81.0000 mg | CHEWABLE_TABLET | Freq: Every day | ORAL | Status: DC

## 2017-03-27 MED ORDER — TICAGRELOR 90 MG PO TABS: 90.0000 mg | ORAL_TABLET | Freq: Two times a day (BID) | ORAL | 11 refills | Status: DC

## 2017-03-27 MED ORDER — NICOTINE 21 MG/24HR TD PT24: 21.0000 mg | MEDICATED_PATCH | Freq: Every day | TRANSDERMAL | 0 refills | Status: DC

## 2017-03-27 MED ORDER — PNEUMOCOCCAL VAC POLYVALENT 25 MCG/0.5ML IJ INJ
0.5000 mL | INJECTION | INTRAMUSCULAR | Status: AC
  Administered 2017-03-27: 14:00:00 0.5 mL via INTRAMUSCULAR
  Filled 2017-03-27: qty 0.5

## 2017-03-27 MED FILL — NICOTINE 21 MG/24HR PATCH: 21 | 28 days supply | Qty: 28 | Fill #0

## 2017-03-27 MED FILL — BRILINTA 90 MG TABLET: 90 | 30 days supply | Qty: 60 | Fill #0

## 2017-03-27 NOTE — Progress Notes (Signed)
Progress Note  Patient Name: [name] [name] Date of Encounter: 03/27/2017  Primary Cardiologist: Dr. Claiborne Billings  Subjective   Pt feeling well. Has brief mild chest pressure last night once, none since. No shortness of breath or orthopnea.   Inpatient Medications    Scheduled Meds: . aspirin  81 mg Oral Daily  . atorvastatin  40 mg Oral q1800  . escitalopram  10 mg Oral Daily  . fluticasone furoate-vilanterol  1 puff Inhalation Daily  . furosemide  20 mg Oral Daily  . gabapentin  600 mg Oral QID  . heparin  5,000 Units Subcutaneous Q8H  . Influenza vac split quadrivalent PF  0.5 mL Intramuscular Tomorrow-1000  . labetalol  100 mg Oral BID  . levothyroxine  100 mcg Oral QAC breakfast  . losartan  50 mg Oral Daily  . montelukast  10 mg Oral QHS  . nicotine  21 mg Transdermal Daily  . pantoprazole  80 mg Oral Daily  . sodium chloride flush  3 mL Intravenous Q12H  . sodium chloride flush  3 mL Intravenous Q12H  . ticagrelor  90 mg Oral BID   Continuous Infusions: . sodium chloride    . sodium chloride     PRN Meds: sodium chloride, sodium chloride, acetaminophen, cyclobenzaprine, gi cocktail, hydrOXYzine, ipratropium-albuterol, nitroGLYCERIN, ondansetron (ZOFRAN) IV, sodium chloride flush, sodium chloride flush   Vital Signs    Vitals:   03/26/17 2212 03/26/17 2300 03/27/17 0410 03/27/17 0502  BP: (!) 129/94 (!) 119/57 (!) 134/57   Pulse: (!) 102 67 65   Resp: 16 11 17    Temp:  97.9 F (36.6 C) 97.7 F (36.5 C)   TempSrc:  Oral Oral   SpO2: 95% 97% 95%   Weight:    147 lb (66.7 kg)  Height:        Intake/Output Summary (Last 24 hours) at 03/27/17 0737 Last data filed at 03/27/17 0600  Gross per 24 hour  Intake             1443 ml  Output             4975 ml  Net            -3532 ml   Filed Weights   03/25/17 1134 03/25/17 2105 03/27/17 0502  Weight: 147 lb (66.7 kg) 147 lb (66.7 kg) 147 lb (66.7 kg)    Telemetry    NSR - Personally Reviewed  ECG      Sinus arrhythmia at 57 bpm, no changes - Personally Reviewed  Physical Exam   GEN: No acute distress.   Neck: No JVD Cardiac: RRR, no murmurs, rubs, or gallops.  Respiratory: Clear to auscultation bilaterally. GI: Soft, nontender, non-distended  MS: No edema; No deformity. Neuro:  Nonfocal  Psych: Normal affect   Right wrist with small ecchymosis and mild tenderness, no hematoma or drainage.   Labs    Chemistry Recent Labs Lab 03/25/17 1137 03/26/17 0400 03/27/17 0350  NA 137 134* 137  K 4.0 4.4 4.3  CL 103 104 106  CO2 25 25 27   GLUCOSE 108* 176* 121*  BUN 14 16 11   CREATININE 0.90 0.95 0.90  CALCIUM 9.2 9.2 9.1  PROT  --  6.3*  --   ALBUMIN  --  3.3*  --   AST  --  15  --   ALT  --  15  --   ALKPHOS  --  78  --   BILITOT  --  0.4  --   GFRNONAA >60 >60 >60  GFRAA >60 >60 >60  ANIONGAP 9 5 4*     Hematology Recent Labs Lab 03/25/17 1137 03/26/17 0400 03/27/17 0350  WBC 11.6* 9.9 14.8*  RBC 4.71 4.51 4.24  HGB 14.1 13.3 12.9  HCT 42.8 40.7 38.6  MCV 90.9 90.2 91.0  MCH 29.9 29.5 30.4  MCHC 32.9 32.7 33.4  RDW 16.4* 16.2* 16.9*  PLT 345 329 333    Cardiac Enzymes Recent Labs Lab 03/25/17 1509 03/25/17 1836 03/25/17 2154  TROPONINI <0.03 <0.03 <0.03    Recent Labs Lab 03/25/17 1156  TROPIPOC 0.00     BNPNo results for input(s): BNP, PROBNP in the last 168 hours.   DDimer No results for input(s): DDIMER in the last 168 hours.   Radiology    Dg Chest 2 View  Result Date: 03/25/2017 CLINICAL DATA:  Right-sided chest pain. EXAM: CHEST  2 VIEW COMPARISON:  February 21, 2016. FINDINGS: The cardiomediastinal silhouette is normal in size. Normal pulmonary vascularity. Atherosclerotic calcification of the aortic arch. The lungs are hyperinflated. Unchanged streaky atelectasis/scarring at the left lung base. No focal consolidation, pleural effusion, or pneumothorax. No acute osseous abnormality. IMPRESSION: COPD.  No active cardiopulmonary disease.  Electronically Signed   By: Titus Dubin M.D.   On: 03/25/2017 13:03    Cardiac Studies   LEFT HEART CATH AND CORONARY ANGIOGRAPHY  03/26/17  Conclusion:  Chronic total occlusion of the right coronary due to in-stent restenosis. Left-to-right collaterals are noted.  Diffuse 50% narrowing of the proximal LAD stent. Diffuse 40-50% in-stent restenosis of the first diagonal.  70% mid second obtuse marginal stenosis.  50% proximal third obtuse marginal stenosis.  Widely patent left main coronary artery.  Normal left ventricular systolic function, EF 24%, with EDP 5 mmHg.  RECOMMENDATIONS:  Aggressive risk factor modification  Care management to help with acquiring medication.   Echocardiogram 03/26/17 Study Conclusions  - Left ventricle: The cavity size was normal. Systolic function was   normal. The estimated ejection fraction was in the range of 55%   to 60%. Wall motion was normal; there were no regional wall   motion abnormalities. Doppler parameters are consistent with   abnormal left ventricular relaxation (grade 1 diastolic   dysfunction).  Patient Profile     61 y.o. female with a hx of CAD s/p PCI, HTN, HLD, illicit drug use (crack), hypothyroidism, psoriasis, and COPD who is being seen evaluation of chest pain. History of CAD and is s/p DES to RCA in 2001 with staged intervention to proximal and mid LAD with bare metal stents, cutting balloon arthrotomy to the ostium of her diagonal. 01/2010 she developed in-stent restenosis of the RCA with placement of overlapping DES stents.  She was transitioned from plavix to effient.  Assessment & Plan    1. Unstable angina -Symptoms consistent with unstable angina. Hx of medication non-compliance.  -Troponins negative X 3, EKG without ischemic changes -Cardiac cath done yesterday revealed chronic total occlusion of the right coronary due to in-stent restenosis. Left-to-right collaterals are noted. Normal LV function and EDP 5  mm Hg.  -Echo yesterday showed normal LV function with EF 55-60%, no RWMA, and grade 1 DD. -Aggressive risk factor modification is recommended. She continues to smoke. -Pt was on Effient as an outpatient but reports that she has been unable to fill it. Not able to obtain patient assistance program through [facility]. Needs to be on antiplatelet therapy and appears to be hypo  responder to Plavix as she had ISR in the past with Plavix. Switched now to [employer]. CM referral has been placed for patient assistance.  -Labs are stable with SCr 0.90  2. Hypertension -Home labetalol and losartan -Pt was out of her meds for 3 days PTA. -Was initially hypertensive. BP now well controlled.   3. Hyperlipidemia -Continue atorvastatin, LDL goal <70 -Explained rational and importance of statin.  4. COPD -home inhalers. Nebs prn per IM -Advise smoking cessation  5. Tobacco use -Pt continues to smoke 1 PPD. Strongly advised cessation. She agrees to work on.  -Will use nicotine patch    For questions or updates, please contact [place] Please consult [email or web address] for contact info under Cardiology/STEMI.      Signed, Daune Perch, NP  03/27/2017, 7:37 AM    I have personally seen and examined this patient with Daune Perch, NP. I agree with the assessment and plan as outlined above. Cath yesterday with CTO of RCA. Medical management recommended. Doing well this am. Exam stable. Change to Brilinta due to cost of Effient. OK to discharge today.   Lauree Chandler 03/27/2017. 8:14 AM

## 2017-03-27 NOTE — Discharge Summary (Signed)
Physician Discharge Summary  [name] [name] ATF:[id number] DOB: [birth year] DOA: 03/25/2017  PCP: Maren Reamer, MD  Admit date: 03/25/2017 Discharge date: 03/27/2017   Recommendations for Outpatient Follow-Up:   Smoking cessation -patient asking about WEllbutrin-- information given to be discussed with PCP  Discharge Diagnosis:   Principal Problem:   CAD S/P percutaneous coronary angioplasty Active Problems:   Hypothyroidism   TOBACCO ABUSE   Essential hypertension, benign   Dyslipidemia   Anxiety and depression   COPD (chronic obstructive pulmonary disease) (HCC)   Chest pain, exertional   QT prolongation   Chest pain   Discharge disposition:  Home.  Discharge Condition: Improved.  Diet recommendation: Low sodium, heart healthy  Wound care: None.   History of Present Illness:   [name] [name] is a 61 y.o. female with medical history significant of hypothyroidism, psoriasis, insomnia, hypertension, hyperlipidemia, CAD/MI, COPD,.   Chest pain. Acute onset at approximately 10:00 on day of admission. Occurred while patient was ambulating outside of her home. Pain is described as substernal and constant pressure. Relieved after ASA 325 and resting on her couch. Pain radiated to her jaw and right arm. Patient states that this is similar to her previous heart attack type pain. Patient had recurrence of her pain during transport by EMS. Patient also reports medical noncompliance for the last 2 months and has used cocaine approximately 3 days prior to admission. Patient has not used her antihypertensives for 2 days. Patient continues to smoke one pack of cigars per day. She was treated for COPD exacerbation with antibiotics and steroids approximately 3 weeks prior to admission which has not completely resolved. Patient's last cardiac intervention was in 2011 when she had multiple stents placed. Nuclear stress in 2015 was essentially normal.   Hospital Course by Problem:    CP:  -CE negative Cath: Aggressive risk factor modification Care management to help with acquiring medication. -unable to afford effient-- changed to brilenta  HTN: - continue meds  COPD:  -needs to stop smoking  CHronic pain: - continue flexeril, neurontin  Hypothyroid: - continue synthroid  GERD:  - continue PPI  Depression: - continue Lexapro    Medical Consultants:    cards   Discharge Exam:   Vitals:   03/27/17 0740 03/27/17 0748  BP: 135/76   Pulse: 72   Resp: (!) 22   Temp: 98.3 F (36.8 C)   SpO2: 99% 97%   Vitals:   03/27/17 0410 03/27/17 0502 03/27/17 0740 03/27/17 0748  BP: (!) 134/57  135/76   Pulse: 65  72   Resp: 17  (!) 22   Temp: 97.7 F (36.5 C)  98.3 F (36.8 C)   TempSrc: Oral  Oral   SpO2: 95%  99% 97%  Weight:  66.7 kg (147 lb)    Height:        Gen:  NAD    The results of significant diagnostics from this hospitalization (including imaging, microbiology, ancillary and laboratory) are listed below for reference.     Procedures and Diagnostic Studies:   Dg Chest 2 View  Result Date: 03/25/2017 CLINICAL DATA:  Right-sided chest pain. EXAM: CHEST  2 VIEW COMPARISON:  February 21, 2016. FINDINGS: The cardiomediastinal silhouette is normal in size. Normal pulmonary vascularity. Atherosclerotic calcification of the aortic arch. The lungs are hyperinflated. Unchanged streaky atelectasis/scarring at the left lung base. No focal consolidation, pleural effusion, or pneumothorax. No acute osseous abnormality. IMPRESSION: COPD.  No active cardiopulmonary disease. Electronically Signed  By: Titus Dubin M.D.   On: 03/25/2017 13:03     Labs:   Basic Metabolic Panel:  Recent Labs Lab 03/25/17 1137 03/26/17 0400 03/27/17 0350  NA 137 134* 137  K 4.0 4.4 4.3  CL 103 104 106  CO2 25 25 27   GLUCOSE 108* 176* 121*  BUN 14 16 11   CREATININE 0.90 0.95 0.90  CALCIUM 9.2 9.2 9.1   GFR Estimated Creatinine Clearance:  58.9 mL/min (by C-G formula based on SCr of 0.9 mg/dL). Liver Function Tests:  Recent Labs Lab 03/26/17 0400  AST 15  ALT 15  ALKPHOS 78  BILITOT 0.4  PROT 6.3*  ALBUMIN 3.3*   No results for input(s): LIPASE, AMYLASE in the last 168 hours. No results for input(s): AMMONIA in the last 168 hours. Coagulation profile  Recent Labs Lab 03/26/17 0400  INR 0.92    CBC:  Recent Labs Lab 03/25/17 1137 03/26/17 0400 03/27/17 0350  WBC 11.6* 9.9 14.8*  HGB 14.1 13.3 12.9  HCT 42.8 40.7 38.6  MCV 90.9 90.2 91.0  PLT 345 329 333   Cardiac Enzymes:  Recent Labs Lab 03/25/17 1509 03/25/17 1836 03/25/17 2154  TROPONINI <0.03 <0.03 <0.03   BNP: Invalid input(s): POCBNP CBG:  Recent Labs Lab 03/26/17 0550 03/26/17 1121 03/26/17 1641 03/26/17 2124 03/27/17 0605  GLUCAP 162* 176* 145* 131* 111*   D-Dimer No results for input(s): DDIMER in the last 72 hours. Hgb A1c No results for input(s): HGBA1C in the last 72 hours. Lipid Profile No results for input(s): CHOL, HDL, LDLCALC, TRIG, CHOLHDL, LDLDIRECT in the last 72 hours. Thyroid function studies No results for input(s): TSH, T4TOTAL, T3FREE, THYROIDAB in the last 72 hours.  Invalid input(s): FREET3 Anemia work up No results for input(s): VITAMINB12, FOLATE, FERRITIN, TIBC, IRON, RETICCTPCT in the last 72 hours. Microbiology No results found for this or any previous visit (from the past 240 hour(s)).   Discharge Instructions:   Discharge Instructions    Diet - low sodium heart healthy    Complete by:  As directed    Increase activity slowly    Complete by:  As directed      Allergies as of 03/27/2017      Reactions   Other Anaphylaxis   avocados   Codeine Nausea Only   Latex Rash   REACTION: shortness of breath and rash      Medication List    STOP taking these medications   azithromycin 250 MG tablet Commonly known as:  ZITHROMAX   prasugrel 10 MG Tabs tablet Commonly known as:   EFFIENT   predniSONE 10 MG tablet Commonly known as:  DELTASONE     TAKE these medications   albuterol 108 (90 Base) MCG/ACT inhaler Commonly known as:  PROVENTIL HFA;VENTOLIN HFA Inhale 2 puffs into the lungs every 4 (four) hours as needed for wheezing or shortness of breath. What changed:  Another medication with the same name was removed. Continue taking this medication, and follow the directions you see here.   aspirin 81 MG chewable tablet Chew 1 tablet (81 mg total) by mouth daily.   atorvastatin 40 MG tablet Commonly known as:  LIPITOR Take 1 tablet (40 mg total) by mouth daily.   calcium carbonate 600 MG tablet Commonly known as:  OS-CAL Take 600 mg by mouth daily.   clotrimazole 1 % cream Commonly known as:  LOTRIMIN Apply 1 application topically 2 (two) times daily. apply to tips of fingers   cyclobenzaprine  10 MG tablet Commonly known as:  FLEXERIL Take 1 tablet (10 mg total) by mouth 3 (three) times daily as needed. What changed:  reasons to take this   escitalopram 10 MG tablet Commonly known as:  LEXAPRO Take 1 tablet (10 mg total) by mouth daily.   fluocinolone 0.01 % cream Commonly known as:  VANOS Apply topically 2 (two) times daily.   fluticasone 50 MCG/ACT nasal spray Commonly known as:  FLONASE Place 2 sprays into both nostrils at bedtime.   fluticasone furoate-vilanterol 200-25 MCG/INH Aepb Commonly known as:  BREO ELLIPTA Inhale 1 puff into the lungs daily. What changed:  Another medication with the same name was removed. Continue taking this medication, and follow the directions you see here.   furosemide 20 MG tablet Commonly known as:  LASIX TAKE 1 TABLET BY MOUTH DAILY.   gabapentin 600 MG tablet Commonly known as:  NEURONTIN TAKE 1 TABLET BY MOUTH 4 TIMES DAILY   hydrOXYzine 25 MG capsule Commonly known as:  VISTARIL TAKE 1 CAPSULE BY MOUTH 3 TIMES DAILY AS NEEDED. What changed:  See the new instructions.   ipratropium-albuterol  0.5-2.5 (3) MG/3ML Soln Commonly known as:  DUONEB Take 3 mLs by nebulization every 6 (six) hours as needed. What changed:  reasons to take this   labetalol 100 MG tablet Commonly known as:  NORMODYNE TAKE 1 TABLET BY MOUTH 2 TIMES DAILY.   levothyroxine 100 MCG tablet Commonly known as:  SYNTHROID, LEVOTHROID Take 1 tablet (100 mcg total) by mouth daily.   losartan 50 MG tablet Commonly known as:  COZAAR TAKE 1 TABLET BY MOUTH DAILY   montelukast 10 MG tablet Commonly known as:  SINGULAIR Take 1 tablet (10 mg total) by mouth at bedtime.   nicotine 21 mg/24hr patch Commonly known as:  NICODERM CQ - dosed in mg/24 hours Place 1 patch (21 mg total) onto the skin daily.   omeprazole 40 MG capsule Commonly known as:  PRILOSEC Take 1 capsule (40 mg total) by mouth daily.   sodium chloride 0.65 % nasal spray Commonly known as:  OCEAN Place 1 spray into the nose as needed for congestion.   ticagrelor 90 MG Tabs tablet Commonly known as:  BRILINTA Take 1 tablet (90 mg total) by mouth 2 (two) times daily.   traMADol 50 MG tablet Commonly known as:  ULTRAM Take 1 tablet (50 mg total) by mouth every 12 (twelve) hours as needed. What changed:  reasons to take this   Vitamin D (Ergocalciferol) 50000 units Caps capsule Commonly known as:  DRISDOL TAKE 1 CAPSULE BY MOUTH EVERY 7 DAYS.            Discharge Care Instructions        Start     Ordered   03/27/17 0000  aspirin 81 MG chewable tablet  Daily     03/27/17 0807   03/27/17 0000  nicotine (NICODERM CQ - DOSED IN MG/24 HOURS) 21 mg/24hr patch  Daily     03/27/17 0807   03/27/17 0000  Increase activity slowly     03/27/17 0807   03/27/17 0000  Diet - low sodium heart healthy     03/27/17 0807   03/27/17 0000  ticagrelor (BRILINTA) 90 MG TABS tablet  2 times daily     03/27/17 0623     Follow-up [facility] Follow up on 04/04/2017.   Why:  11:30 for hospital follow  up Contact  information: [place] [address] [phone]       [facility], [place] Follow up.   Specialties:  Cardiology, Radiology Why:  On September 28th at 9:00 for cardiology hospital follow up.  Contact information: [address] [place] [address] [phone]            Time coordinating discharge: 35 min  Signed:  JESSICA Alison Stalling   [facility] 03/27/2017, 9:41 AM

## 2017-03-27 NOTE — Discharge Instructions (Signed)

## 2017-04-03 MED FILL — LOSARTAN POTASSIUM 50 MG TA: 50 | 30 days supply | Qty: 30 | Fill #2

## 2017-04-03 MED FILL — LEVOTHYROXINE 100 MCG TAB: 100 | 30 days supply | Qty: 30 | Fill #0

## 2017-04-03 MED FILL — ?ATORVASTATIN 40MG TABLET: 40 | 30 days supply | Qty: 30 | Fill #4

## 2017-04-03 MED FILL — ESCITALOPRAM 10 MG TABLET: 10 | 30 days supply | Qty: 30 | Fill #3

## 2017-04-04 ENCOUNTER — Ambulatory Visit: Payer: Self-pay | Attending: Physician Assistant | Admitting: Physician Assistant

## 2017-04-04 VITALS — BP 124/78 | HR 93 | Temp 98.6°F | Resp 18 | Ht 61.0 in | Wt 144.0 lb

## 2017-04-04 DIAGNOSIS — R0600 Dyspnea, unspecified: Secondary | ICD-10-CM

## 2017-04-04 DIAGNOSIS — J449 Chronic obstructive pulmonary disease, unspecified: Secondary | ICD-10-CM | POA: Insufficient documentation

## 2017-04-04 DIAGNOSIS — K219 Gastro-esophageal reflux disease without esophagitis: Secondary | ICD-10-CM | POA: Insufficient documentation

## 2017-04-04 DIAGNOSIS — E785 Hyperlipidemia, unspecified: Secondary | ICD-10-CM | POA: Insufficient documentation

## 2017-04-04 DIAGNOSIS — F419 Anxiety disorder, unspecified: Secondary | ICD-10-CM

## 2017-04-04 DIAGNOSIS — E118 Type 2 diabetes mellitus with unspecified complications: Secondary | ICD-10-CM

## 2017-04-04 DIAGNOSIS — I10 Essential (primary) hypertension: Secondary | ICD-10-CM | POA: Insufficient documentation

## 2017-04-04 DIAGNOSIS — I2510 Atherosclerotic heart disease of native coronary artery without angina pectoris: Secondary | ICD-10-CM | POA: Insufficient documentation

## 2017-04-04 DIAGNOSIS — E039 Hypothyroidism, unspecified: Secondary | ICD-10-CM | POA: Insufficient documentation

## 2017-04-04 DIAGNOSIS — E119 Type 2 diabetes mellitus without complications: Secondary | ICD-10-CM | POA: Insufficient documentation

## 2017-04-04 DIAGNOSIS — Z955 Presence of coronary angioplasty implant and graft: Secondary | ICD-10-CM | POA: Insufficient documentation

## 2017-04-04 DIAGNOSIS — F329 Major depressive disorder, single episode, unspecified: Secondary | ICD-10-CM

## 2017-04-04 MED ORDER — HYDROXYZINE PAMOATE 25 MG PO CAPS: ORAL_CAPSULE | ORAL | 0 refills | Status: DC

## 2017-04-04 MED ORDER — CYCLOBENZAPRINE HCL 10 MG PO TABS: 10.0000 mg | ORAL_TABLET | Freq: Three times a day (TID) | ORAL | 2 refills | Status: DC | PRN

## 2017-04-04 MED ORDER — FUROSEMIDE 20 MG PO TABS: 20.0000 mg | ORAL_TABLET | Freq: Every day | ORAL | 3 refills | Status: DC

## 2017-04-04 MED ORDER — ATORVASTATIN CALCIUM 40 MG PO TABS: 40.0000 mg | ORAL_TABLET | Freq: Every day | ORAL | 1 refills | Status: DC

## 2017-04-04 MED ORDER — TRUEPLUS LANCETS 28G MISC: 28.0000 g | Freq: Four times a day (QID) | 2 refills | Status: DC

## 2017-04-04 MED ORDER — LOSARTAN POTASSIUM 50 MG PO TABS: 50.0000 mg | ORAL_TABLET | Freq: Every day | ORAL | 0 refills | Status: DC

## 2017-04-04 MED ORDER — GLUCOSE BLOOD VI STRP: ORAL_STRIP | 12 refills | Status: DC

## 2017-04-04 MED ORDER — LEVOTHYROXINE SODIUM 100 MCG PO TABS: 100.0000 ug | ORAL_TABLET | Freq: Every day | ORAL | 2 refills | Status: DC

## 2017-04-04 MED ORDER — TRUE METRIX METER DEVI: 1.0000 | Freq: Four times a day (QID) | 0 refills | Status: DC

## 2017-04-04 MED FILL — TRUE METRIX GLUCOSE TEST ST: 30 days supply | Qty: 100 | Fill #0

## 2017-04-04 MED FILL — TRUE METRIX BLOOD GLUCOSE M: W/DEVICE | 365 days supply | Qty: 1 | Fill #0

## 2017-04-04 MED FILL — TRUEplus LANCETS 28G MISC: 25 days supply | Qty: 100 | Fill #0

## 2017-04-04 NOTE — Progress Notes (Signed)
Chief Complaint: "hospital follow up"  Subjective: This is a 61 year old female with a history of diabetes mellitus type 2, gastroesophageal reflux disease, hypothyroidism, hypertension, hyperlipidemia, coronary artery disease with previous stenting, COPD and substance abuse who recently went to the emergency room on 03/25/2017 with complaints of chest pain. She describes substernal chest pressure with exertion. Better with aspirin. Similar to when she had heart issues in the past. Cardiac enzymes are negative. Her chest x-ray was nonrevealing. EKG was nonrevealing. She was admitted by the internal medicine team with consultation from cardiology. She underwent cardiac catheterization and no PCI was necessary. She did have a chronic total occlusion with in-stent restenosis of the right coronary artery with left-to-right collaterals. She had moderate disease elsewhere. Her ejection fraction was preserved by echocardiogram. She had an uncomplicated hospital course. She was sent home on dual antiplatelet therapy and recommendations for aggressive risk factor modification.  Since discharge she states that the new medication that was started, Brilinta, has caused some shortness of breath but this has improved as time has gone on. She has been compliant with her medications. Unfortunately she continues to smoke possibly one pack per day of cigarettes. On yesterday she receives nicotine patches and is working at decrease in her nicotine amount. No further chest pain. No palpitations. No syncope. No lightheadedness. No recent drug use.  She needs refills on several medications today.   ROS:  GEN: denies fever or chills, denies change in weight Skin: denies lesions or rashes HEENT: denies headache, earache, epistaxis, sore throat, or neck pain LUNGS: denies SHOB, dyspnea, PND, orthopnea CV: denies CP or palpitations ABD: denies abd pain, N or V EXT: denies muscle spasms or swelling; no pain in lower ext, no  weakness NEURO: denies numbness or tingling, denies sz, stroke or TIA   Objective:  Vitals:   04/04/17 1154  BP: 124/78  Pulse: 93  Resp: 18  Temp: 98.6 F (37 C)  TempSrc: Oral  SpO2: 96%  Weight: 144 lb (65.3 kg)  Height: 5' 1" (1.549 m)    Physical Exam:  General: in no acute distress. HEENT: no pallor, no icterus, moist oral mucosa, no JVD, no lymphadenopathy Heart: Normal  s1 &s2  Regular rate and rhythm, without murmurs, rubs, gallops. Lungs: Clear to auscultation bilaterally. Abdomen: Soft, nontender, nondistended, positive bowel sounds. Extremities: No clubbing cyanosis or edema with positive pedal pulses. Neuro: Alert, awake, oriented x3, nonfocal.   Medications: Prior to Admission medications   Medication Sig Start Date End Date Taking? Authorizing Provider  aspirin 81 MG chewable tablet Chew 1 tablet (81 mg total) by mouth daily. 03/27/17  Yes Eulogio Bear U, DO  Calcium 600 MG tablet Take 600 mg by mouth daily.  07/14/13  Yes Patrecia Pour, NP  fluocinolone (VANOS) 0.01 % cream Apply topically 2 (two) times daily. 06/18/16  Yes Arnoldo Morale, MD  fluticasone (FLONASE) 50 MCG/ACT nasal spray Place 2 sprays into both nostrils at bedtime. 04/16/16  Yes Langeland, Dawn T, MD  fluticasone furoate-vilanterol (BREO ELLIPTA) 200-25 MCG/INH AEPB Inhale 1 puff into the lungs daily. 09/24/16  Yes Langeland, Dawn T, MD  furosemide (LASIX) 20 MG tablet Take 1 tablet (20 mg total) by mouth daily. 04/04/17  Yes Lavaya Defreitas S, PA-C  gabapentin (NEURONTIN) 600 MG tablet TAKE 1 TABLET BY MOUTH 4 TIMES DAILY 03/13/17  Yes Argentina Donovan, PA-C  hydrOXYzine (VISTARIL) 25 MG capsule TAKE 25MG BY MOUTH THREE TIMES DAILY 04/04/17  Yes Brayton Caves, PA-C  ipratropium-albuterol (DUONEB) 0.5-2.5 (3) MG/3ML SOLN Take 3 mLs by nebulization every 6 (six) hours as needed. Patient taking differently: Take 3 mLs by nebulization every 6 (six) hours as needed (wheezing).  03/07/17  Yes Mabe,  David, NP  labetalol (NORMODYNE) 100 MG tablet TAKE 1 TABLET BY MOUTH 2 TIMES DAILY. 03/18/17  Yes Tresa Garter, MD  levothyroxine (SYNTHROID, LEVOTHROID) 100 MCG tablet Take 1 tablet (100 mcg total) by mouth daily. 04/04/17  Yes Ena Dawley, Genesis Novosad S, PA-C  losartan (COZAAR) 50 MG tablet Take 1 tablet (50 mg total) by mouth daily. 04/04/17  Yes Ena Dawley, Jakevious Hollister S, PA-C  montelukast (SINGULAIR) 10 MG tablet Take 1 tablet (10 mg total) by mouth at bedtime. 08/21/16  Yes Clent Demark, PA-C  nicotine (NICODERM CQ - DOSED IN MG/24 HOURS) 21 mg/24hr patch Place 1 patch (21 mg total) onto the skin daily. 03/27/17  Yes Geradine Girt, DO  omeprazole (PRILOSEC) 40 MG capsule Take 1 capsule (40 mg total) by mouth daily. 03/13/17  Yes Freeman Caldron M, PA-C  sodium chloride (OCEAN) 0.65 % nasal spray Place 1 spray into the nose as needed for congestion. 08/05/16  Yes Clent Demark, PA-C  ticagrelor (BRILINTA) 90 MG TABS tablet Take 1 tablet (90 mg total) by mouth 2 (two) times daily. 03/27/17  Yes Vann, Jessica U, DO  traMADol (ULTRAM) 50 MG tablet Take 1 tablet (50 mg total) by mouth every 12 (twelve) hours as needed. Patient taking differently: Take 50 mg by mouth every 12 (twelve) hours as needed for moderate pain.  10/31/16  Yes McClung, Dionne Bucy, PA-C  Vitamin D, Ergocalciferol, (DRISDOL) 50000 units CAPS capsule TAKE 1 CAPSULE BY MOUTH EVERY 7 DAYS. 06/29/16  Yes Langeland, Dawn T, MD  albuterol (PROVENTIL HFA;VENTOLIN HFA) 108 (90 Base) MCG/ACT inhaler Inhale 2 puffs into the lungs every 4 (four) hours as needed for wheezing or shortness of breath. 09/24/16 03/25/17  Maren Reamer, MD  atorvastatin (LIPITOR) 40 MG tablet Take 1 tablet (40 mg total) by mouth daily. 04/04/17 10/01/17  Brayton Caves, PA-C  Blood Glucose Monitoring Suppl (TRUE METRIX METER) DEVI 1 kit by Does not apply route 4 (four) times daily. 04/04/17   Brayton Caves, PA-C  clotrimazole (LOTRIMIN) 1 % cream Apply 1 application  topically 2 (two) times daily. apply to tips of fingers Patient not taking: Reported on 04/04/2017 06/18/16   Arnoldo Morale, MD  cyclobenzaprine (FLEXERIL) 10 MG tablet Take 1 tablet (10 mg total) by mouth 3 (three) times daily as needed. 04/04/17   Brayton Caves, PA-C  escitalopram (LEXAPRO) 10 MG tablet Take 1 tablet (10 mg total) by mouth daily. 08/21/16 03/25/17  Clent Demark, PA-C  glucose blood (TRUE METRIX BLOOD GLUCOSE TEST) test strip Use as instructed 04/04/17   Brayton Caves, PA-C  TRUEPLUS LANCETS 28G MISC 28 g by Does not apply route QID. 04/04/17   Brayton Caves, PA-C    Assessment/Plan: 1. CAD s/p PCI in past  -no PCI this admit 9/18  -cont aggressive RF modification  -DAPT with ASA/Brilinta 2. DM2  -meter prescription today, check QID and bring  Log to next visit  -Aim for 30 minutes of exercise most days. Rethink what you drink. Water is great! Aim for 2-3 Carb Choices per meal (30-45 grams) +/- 1 either way  Aim for 0-15 Carbs per snack if hungry  Include protein in moderation with your meals and snacks  Consider reading food labels for Total  Carbohydrate and Fat Grams of foods  Consider checking BG at alternate times per day  Continue taking medication as directed Be mindful about how much sugar you are adding to beverages and other foods. Fruit Punch - find one with no sugar  Measure and decrease portions of carbohydrate foods  Make your plate and don't go back for seconds 3. HTN-stable  -cont current meds 4. Smoker  -cessation discussed, she is ready to quit  -patches for now   Today's visit lasted 26 minutes and we discussed for greater than 50% of time risk factor modification in regards to her coronary artery disease.  Follow up: 2-3weeks  The patient was given clear instructions to go to ER or return to medical center if symptoms don't improve, worsen or new problems develop. The patient verbalized understanding. The patient was told to call  to get lab results if they haven't heard anything in the next week.   This note has been created with Surveyor, quantity. Any transcriptional errors are unintentional.   Zettie Pho, PA-C 04/04/2017, 1:32 PM

## 2017-04-04 NOTE — Progress Notes (Signed)
Patient is here for f/up   Patient complains shortness of breathe   Patient needs refill on flexeril furosemide vistaril levothyroxine losartan

## 2017-04-07 ENCOUNTER — Ambulatory Visit: Payer: Self-pay | Attending: Internal Medicine

## 2017-04-11 ENCOUNTER — Encounter: Payer: Self-pay | Admitting: Physician Assistant

## 2017-04-11 ENCOUNTER — Ambulatory Visit (INDEPENDENT_AMBULATORY_CARE_PROVIDER_SITE_OTHER): Payer: No Typology Code available for payment source | Admitting: Physician Assistant

## 2017-04-11 VITALS — BP 115/72 | HR 73 | Ht 61.5 in | Wt 142.0 lb

## 2017-04-11 DIAGNOSIS — I2510 Atherosclerotic heart disease of native coronary artery without angina pectoris: Secondary | ICD-10-CM

## 2017-04-11 DIAGNOSIS — Z9861 Coronary angioplasty status: Secondary | ICD-10-CM

## 2017-04-11 DIAGNOSIS — E785 Hyperlipidemia, unspecified: Secondary | ICD-10-CM

## 2017-04-11 DIAGNOSIS — R7303 Prediabetes: Secondary | ICD-10-CM

## 2017-04-11 DIAGNOSIS — E039 Hypothyroidism, unspecified: Secondary | ICD-10-CM

## 2017-04-11 DIAGNOSIS — I10 Essential (primary) hypertension: Secondary | ICD-10-CM

## 2017-04-11 NOTE — Patient Instructions (Addendum)
Medication Instructions:  Your physician recommends that you continue on your current medications as directed. Please refer to the Current Medication list given to you today.   Labwork: 1 WEEK:  FASTING LIPID & LFT  Testing/Procedures: None ordered  Follow-Up: Your physician wants you to follow-up in: 5 MONTHS WITH DR. Claiborne Billings  You will receive a reminder letter in the mail two months in advance. If you don't receive a letter, please call our office to schedule the follow-up appointment.    Any Other Special Instructions Will Be Listed Below (If Applicable).  1.  YOU NEED TO STOP SMOKING 2.  CONTINUE WITH THE DIET/EXERCISING FOR PRE DIABETES  If you need a refill on your cardiac medications before your next appointment, please call your pharmacy.

## 2017-04-11 NOTE — Progress Notes (Signed)
Cardiology Office Note    Date:  04/13/2017   ID:  [name], DOB 1956-04-03, MRN [id number]  PCP:  No primary care provider on file.  Cardiologist:  Dr. Claiborne Billings   Chief Complaint  Patient presents with  . Follow-up    seen for Dr. Claiborne Billings.     History of Present Illness:  [name] [name] is a 61 y.o. female with PMH of HTN, HLD, prediabetes, CAD, illicit drug use (crack), hypothyroidism, psoriasis and COPD. She had a history of DES to RCA in 2001 was staged intervention to proximal and mid LAD with bare metal stents, cutting balloon atherotomy to the ostial diagonal. 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 She was transitioned from Plavix to Effient. She presented to the hospital on 03/25/2017 with chest discomfort. Initial troponin was negative, however her symptom was concerning for angina. Initial EKG also showed prolonged QTC, she was advised to see her psychiatrist as outpatient to transition from Lexapro to another medication that does not prolonged QT interval. She eventually underwent a cardiac catheterization on 03/26/2017 by Dr. Tamala Julian which showed chronic total occlusion of RCA due to the in-stent restenosis with left-to-right collaterals, diffuse 50% narrowing in proximal LAD, diffuse 40-50% in-stent restenosis in the first diagonal, 70% mid OM 2, 50% proximal OM 3 stenosis, EF 65%. Aggressive medical therapy was recommended. Echocardiogram obtained on the same day showed EF 55-60%, grade 1 DD, no RWMA. She was unable to get outpatient assistance for Effient, she appears to be a hypo-responder to Plavix as she had in-stent restenosis in the past with Plavix, therefore she was switched to [place].  Since recent discharge, she has not had any further chest discomfort. She has not had a fasting lipid panel in a year, she will need a fasting lipid panel and LFTs. Unfortunately she is not fasting today. She has not had any further chest  discomfort. She had baseline dyspnea on exertion that is related to her COPD. We had a long discussion about tobacco cessation today. She does not have any issue of affording the Brilinta this point. We'll continue on aspirin and Brilinta. She is also on labetalol and losartan. Her blood pressure is well-controlled. She does have prediabetes, recommended a diet and exercise. She will follow-up with Dr. Claiborne Billings in 4-5 months. Otherwise she has no lower extremity edema, orthopnea or PND.   Past Medical History:  Diagnosis Date  . Anxiety    takes Lexapro daily  . Arthritis    "back, from neck down pass my bra area" (03/25/2017)  . Asthma   . Bartholin gland cyst 08/29/2011  . Bruises easily    pt is on Effient  . Chronic back pain    herniated nucleus pulposus  . Chronic back pain    "neck to bra area; lower back" (03/25/2017)  . COPD (chronic obstructive pulmonary disease) ([place])    early stages  . Coronary artery disease   . Depression    takes Klonopin daily  . Diverticulosis   . GERD (gastroesophageal reflux disease)    takes Nexium daily  . H/O hiatal hernia   . Heart attack ([place]) 2011  . Hemorrhoids   . Hernia   . Hyperlipidemia    takes Lipitor daily  . Hypertension    takes Losartan daily and Labetalol bid  . Hypothyroidism    takes Synthroid daily  . Insomnia    hydroxyzine prn  . Joint pain   .  Pneumonia    "couple times" (03/25/2017)  . Pre-diabetes    "just found out 1 wk ago" (03/25/2017)  . Psoriasis    elbows,knees,back  . Shortness of breath    with exertion  . Slowing of urinary stream   . Stress incontinence     Past Surgical History:  Procedure Laterality Date  . ABDOMINAL EXPLORATION SURGERY  1977  . ABDOMINAL HYSTERECTOMY  1977   "left one of my ovaries"  . BACK SURGERY    . COLONOSCOPY    . CORONARY ANGIOPLASTY WITH STENT PLACEMENT  2011 X2   "regular stents didn't work; had to go back in in ~ 1 month and put in medicated stents"  . DILATION AND  CURETTAGE OF UTERUS    . ESOPHAGOGASTRODUODENOSCOPY    . LEFT HEART CATH AND CORONARY ANGIOGRAPHY N/A 03/26/2017   Procedure: LEFT HEART CATH AND CORONARY ANGIOGRAPHY;  Surgeon: Belva Crome, MD;  Location: [facility];  Service: Cardiovascular;  Laterality: N/A;  . LUMBAR LAMINECTOMY/DECOMPRESSION MICRODISCECTOMY  08/05/2011   Procedure: LUMBAR LAMINECTOMY/DECOMPRESSION MICRODISCECTOMY;  Surgeon: Otilio Connors, MD;  Location: [facility];  Service: Neurosurgery;  Laterality: Right;  Right Lumbar four-five extraforaminal discectomy  . TONSILLECTOMY     as a child    Current Medications: Outpatient Medications Prior to Visit  Medication Sig Dispense Refill  . aspirin 81 MG chewable tablet Chew 1 tablet (81 mg total) by mouth daily.    Marland Kitchen atorvastatin (LIPITOR) 40 MG tablet Take 1 tablet (40 mg total) by mouth daily. 90 tablet 1  . Blood Glucose Monitoring Suppl (TRUE METRIX METER) DEVI 1 kit by Does not apply route 4 (four) times daily. 1 Device 0  . Calcium 600 MG tablet Take 600 mg by mouth daily.  60 tablet   . cyclobenzaprine (FLEXERIL) 10 MG tablet Take 1 tablet (10 mg total) by mouth 3 (three) times daily as needed. 30 tablet 2  . fluocinolone (VANOS) 0.01 % cream Apply topically 2 (two) times daily. 30 g 0  . fluticasone (FLONASE) 50 MCG/ACT nasal spray Place 2 sprays into both nostrils at bedtime. 16 g 3  . fluticasone furoate-vilanterol (BREO ELLIPTA) 200-25 MCG/INH AEPB Inhale 1 puff into the lungs daily. 1 each 5  . furosemide (LASIX) 20 MG tablet Take 1 tablet (20 mg total) by mouth daily. 30 tablet 3  . gabapentin (NEURONTIN) 600 MG tablet TAKE 1 TABLET BY MOUTH 4 TIMES DAILY 120 tablet 0  . glucose blood (TRUE METRIX BLOOD GLUCOSE TEST) test strip Use as instructed 100 each 12  . hydrOXYzine (VISTARIL) 25 MG capsule TAKE 25MG BY MOUTH THREE TIMES DAILY 90 capsule 0  . ipratropium-albuterol (DUONEB) 0.5-2.5 (3) MG/3ML SOLN Take 3 mLs by nebulization every 6 (six) hours as  needed. (Patient taking differently: Take 3 mLs by nebulization every 6 (six) hours as needed (wheezing). ) 360 mL 0  . labetalol (NORMODYNE) 100 MG tablet TAKE 1 TABLET BY MOUTH 2 TIMES DAILY. 60 tablet 0  . levothyroxine (SYNTHROID, LEVOTHROID) 100 MCG tablet Take 1 tablet (100 mcg total) by mouth daily. 30 tablet 2  . losartan (COZAAR) 50 MG tablet Take 1 tablet (50 mg total) by mouth daily. 90 tablet 0  . montelukast (SINGULAIR) 10 MG tablet Take 1 tablet (10 mg total) by mouth at bedtime. 30 tablet 5  . nicotine (NICODERM CQ - DOSED IN MG/24 HOURS) 21 mg/24hr patch Place 1 patch (21 mg total) onto the skin daily. 28 patch  0  . omeprazole (PRILOSEC) 40 MG capsule Take 1 capsule (40 mg total) by mouth daily. 30 capsule 3  . sodium chloride (OCEAN) 0.65 % nasal spray Place 1 spray into the nose as needed for congestion. 30 mL 12  . ticagrelor (BRILINTA) 90 MG TABS tablet Take 1 tablet (90 mg total) by mouth 2 (two) times daily. 60 tablet 11  . traMADol (ULTRAM) 50 MG tablet Take 1 tablet (50 mg total) by mouth every 12 (twelve) hours as needed. (Patient taking differently: Take 50 mg by mouth every 12 (twelve) hours as needed for moderate pain. ) 60 tablet 0  . TRUEPLUS LANCETS 28G MISC 28 g by Does not apply route QID. 120 each 2  . albuterol (PROVENTIL HFA;VENTOLIN HFA) 108 (90 Base) MCG/ACT inhaler Inhale 2 puffs into the lungs every 4 (four) hours as needed for wheezing or shortness of breath. 1 Inhaler 5  . escitalopram (LEXAPRO) 10 MG tablet Take 1 tablet (10 mg total) by mouth daily. 90 tablet 1  . clotrimazole (LOTRIMIN) 1 % cream Apply 1 application topically 2 (two) times daily. apply to tips of fingers (Patient not taking: Reported on 04/04/2017) 30 g 0  . Vitamin D, Ergocalciferol, (DRISDOL) 50000 units CAPS capsule TAKE 1 CAPSULE BY MOUTH EVERY 7 DAYS. 12 capsule 0   No facility-administered medications prior to visit.      Allergies:   Other; Codeine; and Latex   Social History    Social History  . Marital status: Divorced    Spouse name: N/A  . Number of children: N/A  . Years of education: N/A   Social History Main Topics  . Smoking status: Current Every Day Smoker    Packs/day: 1.00    Years: 49.00    Types: Cigarettes  . Smokeless tobacco: Never Used  . Alcohol use Yes     Comment: 03/25/2017 "glass of wine once in a blue moon; maybe q 5-6 months"  . Drug use: No     Comment: 03/25/2017 "used cocaine a couple days ago; smoke pot from time to time"  . Sexual activity: Not Currently    Birth control/ protection: Surgical   Other Topics Concern  . None   Social History Narrative  . None     Family History:  The patient's family history includes Anesthesia problems in her daughter; Cancer in her maternal aunt and paternal grandmother; Diabetes in her father; Heart disease in her father, maternal grandfather, and mother; Hypertension in her father and mother; Stroke in her mother; Thyroid disease in her paternal aunt.   ROS:   Please see the history of present illness.    ROS All other systems reviewed and are negative.   PHYSICAL EXAM:   VS:  BP 115/72   Pulse 73   Ht 5' 1.5" (1.562 m)   Wt 142 lb (64.4 kg)   SpO2 97%   BMI 26.40 kg/m    GEN: Well nourished, well developed, in no acute distress  HEENT: normal  Neck: no JVD, carotid bruits, or masses Cardiac: RRR; no murmurs, rubs, or gallops,no edema  Respiratory:  Mildly diminished breath sounds diffusely GI: soft, nontender, nondistended, + BS MS: no deformity or atrophy  Skin: warm and dry, no rash Neuro:  Alert and Oriented x 3, Strength and sensation are intact Psych: euthymic mood, full affect  Wt Readings from Last 3 Encounters:  04/11/17 142 lb (64.4 kg)  04/04/17 144 lb (65.3 kg)  03/27/17 147 lb (66.7 kg)  Studies/Labs Reviewed:   EKG:  EKG is not ordered today.    Recent Labs: 10/31/2016: TSH 0.386 03/26/2017: ALT 15 03/27/2017: BUN 11; Creatinine, Ser 0.90;  Hemoglobin 12.9; Platelets 333; Potassium 4.3; Sodium 137   Lipid Panel    Component Value Date/Time   CHOL 208 (H) 04/16/2016 0934   TRIG 74 04/16/2016 0934   HDL 55 04/16/2016 0934   CHOLHDL 3.8 04/16/2016 0934   VLDL 15 04/16/2016 0934   LDLCALC 138 (H) 04/16/2016 0934    Additional studies/ records that were reviewed today include:   Cath 03/26/2017 Conclusion    Chronic total occlusion of the right coronary due to in-stent restenosis. Left-to-right collaterals are noted.  Diffuse 50% narrowing of the proximal LAD stent. Diffuse 40-50% in-stent restenosis of the first diagonal.  70% mid second obtuse marginal stenosis.  50% proximal third obtuse marginal stenosis.  Widely patent left main coronary artery.  Normal left ventricular systolic function, EF 78%, with EDP 5 mmHg.  RECOMMENDATIONS:   Aggressive risk factor modification  Care management to help with acquiring medication.    Echo 03/26/2017 LV EF: 55% -   60%  ------------------------------------------------------------------- Indications:      Chest pain 786.51.  ------------------------------------------------------------------- History:   PMH:   Dyspnea.  Coronary artery disease.  Chronic obstructive pulmonary disease.  PMH:   Myocardial infarction.  Risk factors:  Hypertension. Dyslipidemia.  ------------------------------------------------------------------- Study Conclusions  - Left ventricle: The cavity size was normal. Systolic function was   normal. The estimated ejection fraction was in the range of 55%   to 60%. Wall motion was normal; there were no regional wall   motion abnormalities. Doppler parameters are consistent with   abnormal left ventricular relaxation (grade 1 diastolic   dysfunction).   ASSESSMENT:    1. CAD S/P percutaneous coronary angioplasty   2. Essential hypertension, benign   3. Dyslipidemia   4. Prediabetes   5. Hypothyroidism, unspecified type       PLAN:  In order of problems listed above:  1. CAD: Chronically occluded RCA, nonobstructive disease on recent cardiac catheterization. Will require aggressive risk factor control. We discussed today the need to be compliant with dual antiplatelet therapy. She is a hypo-responder to Plavix, she is currently on aspirin and Brilinta. She is able to get Brilinta from [facility]. She was unable to afford Effient which she has taken for many years.  2. Hypertension: Blood pressure well controlled  3. Hyperlipidemia: On Lipitor 40 mg daily, will need fasting lipid panel and LFT. Unfortunately she is not fasting today.  4. Prediabetes: Encourage diet and exercise  5. Hypothyroidism: On Synthroid  6. Tobacco abuse: We had a long discussion about tobacco cessation today. Her friend who is also a smoker will try it with her.    Medication Adjustments/Labs and Tests Ordered: Current medicines are reviewed at length with the patient today.  Concerns regarding medicines are outlined above.  Medication changes, Labs and Tests ordered today are listed in the Patient Instructions below. Patient Instructions  Medication Instructions:  Your physician recommends that you continue on your current medications as directed. Please refer to the Current Medication list given to you today.   Labwork: 1 WEEK:  FASTING LIPID & LFT  Testing/Procedures: None ordered  Follow-Up: Your physician wants you to follow-up in: 5 MONTHS WITH DR. Claiborne Billings  You will receive a reminder letter in the mail two months in advance. If you don't receive a letter, please call our  office to schedule the follow-up appointment.    Any Other Special Instructions Will Be Listed Below (If Applicable).  1.  YOU NEED TO STOP SMOKING 2.  CONTINUE WITH THE DIET/EXERCISING FOR PRE DIABETES  If you need a refill on your cardiac medications before your next appointment, please call your pharmacy.       Hilbert Corrigan, [place]  04/13/2017 9:16 AM    [address], [place], [place]  [address] Phone: [phone]; Fax: [phone]

## 2017-04-13 ENCOUNTER — Encounter: Payer: Self-pay | Admitting: Physician Assistant

## 2017-04-15 ENCOUNTER — Ambulatory Visit: Payer: Self-pay | Admitting: Family Medicine

## 2017-04-18 MED FILL — OMEPRAZOLE DR 40 MG CAPSULE: 40 | 30 days supply | Qty: 30 | Fill #1

## 2017-04-18 MED FILL — HYDROXYZINE PAM 25 MG CAP: 25 | 30 days supply | Qty: 90 | Fill #1

## 2017-04-18 MED FILL — LABETALOL HCL 100 MG TABLET: 100 | 30 days supply | Qty: 60 | Fill #1

## 2017-04-18 MED FILL — IPRAT-ALBUT 0.5-3(2.5) MG/3: 0.5-2.5 (3) | 7 days supply | Qty: 90 | Fill #2

## 2017-04-18 MED FILL — !BREO ELLIPTA 200-25 MCG: 200-25 | 30 days supply | Qty: 60 | Fill #1

## 2017-04-18 MED FILL — !VENTOLIN HFA INHALER: 108 (90 BAS | 16 days supply | Qty: 18 | Fill #2

## 2017-04-22 ENCOUNTER — Other Ambulatory Visit: Payer: Self-pay | Admitting: Pharmacist

## 2017-04-22 MED ORDER — ALBUTEROL SULFATE HFA 108 (90 BASE) MCG/ACT IN AERS: 2.0000 | INHALATION_SPRAY | Freq: Four times a day (QID) | RESPIRATORY_TRACT | 2 refills | Status: DC | PRN

## 2017-04-24 ENCOUNTER — Ambulatory Visit (HOSPITAL_COMMUNITY)
Admission: EM | Admit: 2017-04-24 | Discharge: 2017-04-24 | Disposition: A | Discharge status: Home / Self Care | Payer: No Typology Code available for payment source | Attending: Family Medicine | Admitting: Family Medicine

## 2017-04-24 ENCOUNTER — Encounter (HOSPITAL_COMMUNITY): Payer: Self-pay | Admitting: Family Medicine

## 2017-04-24 DIAGNOSIS — J441 Chronic obstructive pulmonary disease with (acute) exacerbation: Secondary | ICD-10-CM

## 2017-04-24 MED ORDER — METHYLPREDNISOLONE ACETATE 80 MG/ML IJ SUSP
40.0000 mg | Freq: Once | INTRAMUSCULAR | Status: AC
  Administered 2017-04-24: 40 mg via INTRAMUSCULAR

## 2017-04-24 MED ORDER — PREDNISONE 20 MG PO TABS: ORAL_TABLET | ORAL | 0 refills | Status: DC

## 2017-04-24 MED ORDER — AMOXICILLIN 875 MG PO TABS: 875.0000 mg | ORAL_TABLET | Freq: Two times a day (BID) | ORAL | 0 refills | Status: DC

## 2017-04-24 MED ORDER — METHYLPREDNISOLONE ACETATE 40 MG/ML IJ SUSP
INTRAMUSCULAR | Status: AC
  Filled 2017-04-24: qty 1

## 2017-04-24 NOTE — ED Triage Notes (Signed)
PT has history of COPD. PT reports exertional SOB. PT not in distress at this time. PT did a duoneb at home today. PT reports she also has panic attacks and would like something for those.

## 2017-04-24 NOTE — ED Provider Notes (Signed)
[place]   [id number] 04/24/17 Arrival Time: 1251   SUBJECTIVE:  [name] [name] is a 61 y.o. female who presents to the urgent care with complaint of difficulty breathing in the context of known CAD (recent cath showed no immediate threat), asthma, hypertension, and cigarette smoking with h/o COPD.   About every 6 weeks she gets a flare up of her COPD and this is a typical episode.  No fever, wheezing is worse along with the cough.  Still smoking several a day.    Past Medical History:  Diagnosis Date  . Anxiety    takes Lexapro daily  . Arthritis    "back, from neck down pass my bra area" (03/25/2017)  . Asthma   . Bartholin gland cyst 08/29/2011  . Bruises easily    pt is on Effient  . Chronic back pain    herniated nucleus pulposus  . Chronic back pain    "neck to bra area; lower back" (03/25/2017)  . COPD (chronic obstructive pulmonary disease) ([place])    early stages  . Coronary artery disease   . Depression    takes Klonopin daily  . Diverticulosis   . GERD (gastroesophageal reflux disease)    takes Nexium daily  . H/O hiatal hernia   . Heart attack ([place]) 2011  . Hemorrhoids   . Hernia   . Hyperlipidemia    takes Lipitor daily  . Hypertension    takes Losartan daily and Labetalol bid  . Hypothyroidism    takes Synthroid daily  . Insomnia    hydroxyzine prn  . Joint pain   . Pneumonia    "couple times" (03/25/2017)  . Pre-diabetes    "just found out 1 wk ago" (03/25/2017)  . Psoriasis    elbows,knees,back  . Shortness of breath    with exertion  . Slowing of urinary stream   . Stress incontinence    Family History  Problem Relation Age of Onset  . Heart disease Mother   . Hypertension Mother   . Stroke Mother   . Heart disease Father   . Hypertension Father   . Diabetes Father   . Cancer Maternal Aunt        breast  . Thyroid disease Paternal Aunt   . Heart disease Maternal Grandfather   . Anesthesia problems Daughter   . Cancer  Paternal Grandmother        mouth  . Hypotension Neg Hx   . Malignant hyperthermia Neg Hx   . Pseudochol deficiency Neg Hx    Social History   Social History  . Marital status: Divorced    Spouse name: N/A  . Number of children: N/A  . Years of education: N/A   Occupational History  . Not on file.   Social History Main Topics  . Smoking status: Current Every Day Smoker    Packs/day: 1.00    Years: 49.00    Types: Cigarettes  . Smokeless tobacco: Never Used  . Alcohol use Yes     Comment: 03/25/2017 "glass of wine once in a blue moon; maybe q 5-6 months"  . Drug use: No     Comment: 03/25/2017 "used cocaine a couple days ago; smoke pot from time to time"  . Sexual activity: Not Currently    Birth control/ protection: Surgical   Other Topics Concern  . Not on file   Social History Narrative  . No narrative on file   No outpatient prescriptions have been marked as  taking for the 04/24/17 encounter [facility] Encounter).   Allergies  Allergen Reactions  . Other Anaphylaxis    avocados  . Codeine Nausea Only  . Latex Rash    REACTION: shortness of breath and rash      ROS: As per HPI, remainder of ROS negative.   OBJECTIVE:   Vitals:   04/24/17 1305 04/24/17 1306  BP: (!) 142/85   Pulse: 71   Resp: 18   Temp: 98.2 F (36.8 C)   TempSrc: Oral   SpO2: 97%   Weight:  141 lb (64 kg)  Height:  5' 1.5" (1.562 m)     General appearance: alert; no distress Eyes: PERRL; EOMI; conjunctiva normal HENT: normocephalic; atraumatic; TMs normal, canal normal, external ears normal without trauma; nasal mucosa normal; oral mucosa normal Neck: supple Lungs: clear to auscultation bilaterally Heart: regular rate and rhythm Abdomen: soft, non-tender; bowel sounds normal; no masses or organomegaly; no guarding or rebound tenderness Back: no CVA tenderness Extremities: no cyanosis or edema; symmetrical with no gross deformities Skin: warm and dry Neurologic: normal gait;  grossly normal Psychological: alert and cooperative; normal mood and affect      Labs:  Results for orders placed or performed during the hospital encounter of 56/31/49  Basic metabolic panel  Result Value Ref Range   Sodium 137 135 - 145 mmol/L   Potassium 4.0 3.5 - 5.1 mmol/L   Chloride 103 101 - 111 mmol/L   CO2 25 22 - 32 mmol/L   Glucose, Bld 108 (H) 65 - 99 mg/dL   BUN 14 6 - 20 mg/dL   Creatinine, Ser 0.90 0.44 - 1.00 mg/dL   Calcium 9.2 8.9 - 10.3 mg/dL   GFR calc non Af Amer >60 >60 mL/min   GFR calc Af Amer >60 >60 mL/min   Anion gap 9 5 - 15  CBC  Result Value Ref Range   WBC 11.6 (H) 4.0 - 10.5 K/uL   RBC 4.71 3.87 - 5.11 MIL/uL   Hemoglobin 14.1 12.0 - 15.0 g/dL   HCT 42.8 36.0 - 46.0 %   MCV 90.9 78.0 - 100.0 fL   MCH 29.9 26.0 - 34.0 pg   MCHC 32.9 30.0 - 36.0 g/dL   RDW 16.4 (H) 11.5 - 15.5 %   Platelets 345 150 - 400 K/uL  HIV antibody (Routine Testing)  Result Value Ref Range   HIV Screen 4th Generation wRfx Non Reactive Non Reactive  Troponin I-serum (0, 3, 6 hours)  Result Value Ref Range   Troponin I <0.03 <0.03 ng/mL  Troponin I-serum (0, 3, 6 hours)  Result Value Ref Range   Troponin I <0.03 <0.03 ng/mL  Troponin I-serum (0, 3, 6 hours)  Result Value Ref Range   Troponin I <0.03 <0.03 ng/mL  CBC  Result Value Ref Range   WBC 9.9 4.0 - 10.5 K/uL   RBC 4.51 3.87 - 5.11 MIL/uL   Hemoglobin 13.3 12.0 - 15.0 g/dL   HCT 40.7 36.0 - 46.0 %   MCV 90.2 78.0 - 100.0 fL   MCH 29.5 26.0 - 34.0 pg   MCHC 32.7 30.0 - 36.0 g/dL   RDW 16.2 (H) 11.5 - 15.5 %   Platelets 329 150 - 400 K/uL  Comprehensive metabolic panel  Result Value Ref Range   Sodium 134 (L) 135 - 145 mmol/L   Potassium 4.4 3.5 - 5.1 mmol/L   Chloride 104 101 - 111 mmol/L   CO2 25 22 - 32 mmol/L  Glucose, Bld 176 (H) 65 - 99 mg/dL   BUN 16 6 - 20 mg/dL   Creatinine, Ser 0.95 0.44 - 1.00 mg/dL   Calcium 9.2 8.9 - 10.3 mg/dL   Total Protein 6.3 (L) 6.5 - 8.1 g/dL   Albumin 3.3  (L) 3.5 - 5.0 g/dL   AST 15 15 - 41 U/L   ALT 15 14 - 54 U/L   Alkaline Phosphatase 78 38 - 126 U/L   Total Bilirubin 0.4 0.3 - 1.2 mg/dL   GFR calc non Af Amer >60 >60 mL/min   GFR calc Af Amer >60 >60 mL/min   Anion gap 5 5 - 15  Protime-INR  Result Value Ref Range   Prothrombin Time 12.3 11.4 - 15.2 seconds   INR 0.92   Glucose, capillary  Result Value Ref Range   Glucose-Capillary 157 (H) 65 - 99 mg/dL   Comment 1 Notify RN    Comment 2 Document in Chart   Glucose, capillary  Result Value Ref Range   Glucose-Capillary 203 (H) 65 - 99 mg/dL  Glucose, capillary  Result Value Ref Range   Glucose-Capillary 162 (H) 65 - 99 mg/dL  Glucose, capillary  Result Value Ref Range   Glucose-Capillary 176 (H) 65 - 99 mg/dL   Comment 1 Notify RN    Comment 2 Document in Chart   Glucose, capillary  Result Value Ref Range   Glucose-Capillary 145 (H) 65 - 99 mg/dL   Comment 1 Notify RN    Comment 2 Document in Chart   Basic metabolic panel  Result Value Ref Range   Sodium 137 135 - 145 mmol/L   Potassium 4.3 3.5 - 5.1 mmol/L   Chloride 106 101 - 111 mmol/L   CO2 27 22 - 32 mmol/L   Glucose, Bld 121 (H) 65 - 99 mg/dL   BUN 11 6 - 20 mg/dL   Creatinine, Ser 0.90 0.44 - 1.00 mg/dL   Calcium 9.1 8.9 - 10.3 mg/dL   GFR calc non Af Amer >60 >60 mL/min   GFR calc Af Amer >60 >60 mL/min   Anion gap 4 (L) 5 - 15  CBC  Result Value Ref Range   WBC 14.8 (H) 4.0 - 10.5 K/uL   RBC 4.24 3.87 - 5.11 MIL/uL   Hemoglobin 12.9 12.0 - 15.0 g/dL   HCT 38.6 36.0 - 46.0 %   MCV 91.0 78.0 - 100.0 fL   MCH 30.4 26.0 - 34.0 pg   MCHC 33.4 30.0 - 36.0 g/dL   RDW 16.9 (H) 11.5 - 15.5 %   Platelets 333 150 - 400 K/uL  Glucose, capillary  Result Value Ref Range   Glucose-Capillary 131 (H) 65 - 99 mg/dL   Comment 1 Notify RN    Comment 2 Document in Chart   Glucose, capillary  Result Value Ref Range   Glucose-Capillary 111 (H) 65 - 99 mg/dL   Comment 1 Notify RN    Comment 2 Document in Chart     I-stat troponin, ED  Result Value Ref Range   Troponin i, poc 0.00 0.00 - 0.08 ng/mL   Comment 3          ECHOCARDIOGRAM COMPLETE  Result Value Ref Range   Weight 2,352 oz   Height 61.5 in   BP 146/58 mmHg   LA vol 34.2 mL   LV e' LATERAL 8.92 cm/s   LA vol A4C 36.7 ml   E decel time 176 msec   MV  pk E vel 1.2 m/s   LA vol index 19.9 mL/m2   MV Dec 176    TDI e' medial 8.27    TDI e' lateral 8.92    Lateral S' vel 14.10 cm/sec   TAPSE 23.50 mm    Labs Reviewed - No data to display  No results found.     ASSESSMENT & PLAN:  1. COPD exacerbation ([place])     Meds ordered this encounter  Medications  . methylPREDNISolone acetate (DEPO-MEDROL) injection 40 mg  . predniSONE (DELTASONE) 20 MG tablet    Sig: Two daily with food    Dispense:  10 tablet    Refill:  0  . amoxicillin (AMOXIL) 875 MG tablet    Sig: Take 1 tablet (875 mg total) by mouth 2 (two) times daily.    Dispense:  14 tablet    Refill:  0    Reviewed expectations re: course of current medical issues. Questions answered. Outlined signs and symptoms indicating need for more acute intervention. Patient verbalized understanding. After Visit Summary given.    Procedures:      Robyn Haber, MD 04/24/17 1323

## 2017-05-01 ENCOUNTER — Other Ambulatory Visit: Payer: Self-pay | Admitting: Physician Assistant

## 2017-05-06 ENCOUNTER — Other Ambulatory Visit: Payer: Self-pay | Admitting: *Deleted

## 2017-05-06 MED ORDER — FLUTICASONE FUROATE-VILANTEROL 200-25 MCG/INH IN AEPB: 1.0000 | INHALATION_SPRAY | Freq: Every day | RESPIRATORY_TRACT | 3 refills | Status: DC

## 2017-05-06 NOTE — Telephone Encounter (Signed)
PRINTED FOR PASS PROGRAM 

## 2017-05-07 ENCOUNTER — Inpatient Hospital Stay: Payer: Self-pay | Admitting: Critical Care Medicine

## 2017-05-26 ENCOUNTER — Telehealth: Payer: Self-pay | Admitting: General Practice

## 2017-05-26 DIAGNOSIS — E039 Hypothyroidism, unspecified: Secondary | ICD-10-CM

## 2017-05-26 DIAGNOSIS — F32A Depression, unspecified: Secondary | ICD-10-CM

## 2017-05-26 DIAGNOSIS — F329 Major depressive disorder, single episode, unspecified: Secondary | ICD-10-CM

## 2017-05-26 DIAGNOSIS — F419 Anxiety disorder, unspecified: Principal | ICD-10-CM

## 2017-05-26 MED ORDER — ALBUTEROL SULFATE HFA 108 (90 BASE) MCG/ACT IN AERS: 2.0000 | INHALATION_SPRAY | Freq: Four times a day (QID) | RESPIRATORY_TRACT | 2 refills | Status: DC | PRN

## 2017-05-26 MED ORDER — ASPIRIN 81 MG PO CHEW: 81.0000 mg | CHEWABLE_TABLET | Freq: Every day | ORAL | Status: DC

## 2017-05-26 MED ORDER — MONTELUKAST SODIUM 10 MG PO TABS: 10.0000 mg | ORAL_TABLET | Freq: Every day | ORAL | 0 refills | Status: DC

## 2017-05-26 MED ORDER — HYDROXYZINE PAMOATE 25 MG PO CAPS: ORAL_CAPSULE | ORAL | 0 refills | Status: DC

## 2017-05-26 MED ORDER — FLUTICASONE FUROATE-VILANTEROL 200-25 MCG/INH IN AEPB: 1.0000 | INHALATION_SPRAY | Freq: Every day | RESPIRATORY_TRACT | 0 refills | Status: DC

## 2017-05-26 MED ORDER — ATORVASTATIN CALCIUM 40 MG PO TABS: 40.0000 mg | ORAL_TABLET | Freq: Every day | ORAL | 0 refills | Status: DC

## 2017-05-26 MED ORDER — FLUTICASONE PROPIONATE 50 MCG/ACT NA SUSP: 2.0000 | Freq: Every day | NASAL | 3 refills | Status: DC

## 2017-05-26 MED ORDER — LABETALOL HCL 100 MG PO TABS: 100.0000 mg | ORAL_TABLET | Freq: Two times a day (BID) | ORAL | 0 refills | Status: DC

## 2017-05-26 MED ORDER — LEVOTHYROXINE SODIUM 100 MCG PO TABS: 100.0000 ug | ORAL_TABLET | Freq: Every day | ORAL | 0 refills | Status: DC

## 2017-05-26 MED FILL — FLUTICASONE PROP 50 MCG SPR: 50 | 30 days supply | Qty: 16 | Fill #0

## 2017-05-26 MED FILL — HYDROXYZINE PAM 25 MG CAP: 25 | 30 days supply | Qty: 90 | Fill #0

## 2017-05-26 MED FILL — !BREO ELLIPTA 200-25 MCG: 200-25 | 25 days supply | Qty: 60 | Fill #0

## 2017-05-26 MED FILL — LEVOTHYROXINE 100 MCG TAB: 100 | 30 days supply | Qty: 30 | Fill #0

## 2017-05-26 MED FILL — $VENTOLIN HFA 18G INHALER: 108 (90 BAS | 25 days supply | Qty: 18 | Fill #0

## 2017-05-26 MED FILL — LABETALOL HCL 100 MG TABLET: 100 | 30 days supply | Qty: 60 | Fill #0

## 2017-05-26 MED FILL — ATORVASTATIN 40 MG TABLET: 40 | 30 days supply | Qty: 30 | Fill #0

## 2017-05-26 NOTE — Telephone Encounter (Signed)
Patient called to request a refill on  -fluticasone (FLONASE) 50 MCG/ACT nasal spray  -albuterol (VENTOLIN HFA) 108 (90 Base) MCG/ACT inhaler -aspirin 81 MG chewable tablet -atorvastatin (LIPITOR) 40 MG tablet  -fluticasone furoate-vilanterol (BREO ELLIPTA) 200-25 MCG/INH AEPB  -hydrOXYzine (VISTARIL) 25 MG capsule  -labetalol (NORMODYNE) 100 MG tablet  -levothyroxine (SYNTHROID, LEVOTHROID) 100 MCG tablet  -montelukast (SINGULAIR) 10 MG tablet  -traMADol (ULTRAM) 50 MG tablet   please follow up

## 2017-05-26 NOTE — Telephone Encounter (Signed)
All chronic medications refilled x 30 days. Tramadol will have to be approved by provider. Patient scheduled to see Dr. Joya Gaskins 05/28/17. No PCP to forward request to.

## 2017-05-28 ENCOUNTER — Encounter: Payer: Self-pay | Admitting: Critical Care Medicine

## 2017-05-28 ENCOUNTER — Ambulatory Visit: Payer: Self-pay | Attending: Critical Care Medicine | Admitting: Critical Care Medicine

## 2017-05-28 VITALS — BP 168/69 | HR 73 | Temp 97.7°F | Resp 18 | Ht 61.0 in | Wt 129.8 lb

## 2017-05-28 DIAGNOSIS — M545 Low back pain: Secondary | ICD-10-CM | POA: Insufficient documentation

## 2017-05-28 DIAGNOSIS — E785 Hyperlipidemia, unspecified: Secondary | ICD-10-CM

## 2017-05-28 DIAGNOSIS — F17210 Nicotine dependence, cigarettes, uncomplicated: Secondary | ICD-10-CM | POA: Insufficient documentation

## 2017-05-28 DIAGNOSIS — Z8249 Family history of ischemic heart disease and other diseases of the circulatory system: Secondary | ICD-10-CM | POA: Insufficient documentation

## 2017-05-28 DIAGNOSIS — K210 Gastro-esophageal reflux disease with esophagitis: Secondary | ICD-10-CM | POA: Insufficient documentation

## 2017-05-28 DIAGNOSIS — R7303 Prediabetes: Secondary | ICD-10-CM

## 2017-05-28 DIAGNOSIS — Z79891 Long term (current) use of opiate analgesic: Secondary | ICD-10-CM | POA: Insufficient documentation

## 2017-05-28 DIAGNOSIS — Z7982 Long term (current) use of aspirin: Secondary | ICD-10-CM | POA: Insufficient documentation

## 2017-05-28 DIAGNOSIS — K219 Gastro-esophageal reflux disease without esophagitis: Secondary | ICD-10-CM

## 2017-05-28 DIAGNOSIS — F17200 Nicotine dependence, unspecified, uncomplicated: Secondary | ICD-10-CM

## 2017-05-28 DIAGNOSIS — E039 Hypothyroidism, unspecified: Secondary | ICD-10-CM

## 2017-05-28 DIAGNOSIS — F329 Major depressive disorder, single episode, unspecified: Secondary | ICD-10-CM | POA: Insufficient documentation

## 2017-05-28 DIAGNOSIS — Z833 Family history of diabetes mellitus: Secondary | ICD-10-CM | POA: Insufficient documentation

## 2017-05-28 DIAGNOSIS — Z79899 Other long term (current) drug therapy: Secondary | ICD-10-CM | POA: Insufficient documentation

## 2017-05-28 DIAGNOSIS — IMO0001 Reserved for inherently not codable concepts without codable children: Secondary | ICD-10-CM

## 2017-05-28 DIAGNOSIS — J441 Chronic obstructive pulmonary disease with (acute) exacerbation: Secondary | ICD-10-CM

## 2017-05-28 DIAGNOSIS — R911 Solitary pulmonary nodule: Secondary | ICD-10-CM | POA: Insufficient documentation

## 2017-05-28 DIAGNOSIS — Z9861 Coronary angioplasty status: Secondary | ICD-10-CM | POA: Insufficient documentation

## 2017-05-28 DIAGNOSIS — G8929 Other chronic pain: Secondary | ICD-10-CM

## 2017-05-28 DIAGNOSIS — I10 Essential (primary) hypertension: Secondary | ICD-10-CM

## 2017-05-28 DIAGNOSIS — R1115 Cyclical vomiting syndrome unrelated to migraine: Secondary | ICD-10-CM

## 2017-05-28 DIAGNOSIS — Z823 Family history of stroke: Secondary | ICD-10-CM | POA: Insufficient documentation

## 2017-05-28 DIAGNOSIS — R1110 Vomiting, unspecified: Secondary | ICD-10-CM

## 2017-05-28 DIAGNOSIS — F419 Anxiety disorder, unspecified: Secondary | ICD-10-CM

## 2017-05-28 DIAGNOSIS — J439 Emphysema, unspecified: Secondary | ICD-10-CM

## 2017-05-28 DIAGNOSIS — I2510 Atherosclerotic heart disease of native coronary artery without angina pectoris: Secondary | ICD-10-CM

## 2017-05-28 DIAGNOSIS — J449 Chronic obstructive pulmonary disease, unspecified: Secondary | ICD-10-CM | POA: Insufficient documentation

## 2017-05-28 LAB — POCT GLYCOSYLATED HEMOGLOBIN (HGB A1C): HEMOGLOBIN A1C: 6.2

## 2017-05-28 LAB — GLUCOSE, POCT (MANUAL RESULT ENTRY): POC Glucose: 103 mg/dl — AB (ref 70–99)

## 2017-05-28 MED ORDER — HYDROXYZINE PAMOATE 25 MG PO CAPS: ORAL_CAPSULE | ORAL | 3 refills | Status: DC

## 2017-05-28 MED ORDER — ONDANSETRON HCL 4 MG PO TABS: 4.0000 mg | ORAL_TABLET | Freq: Three times a day (TID) | ORAL | 0 refills | Status: DC | PRN

## 2017-05-28 MED ORDER — GABAPENTIN 600 MG PO TABS: ORAL_TABLET | ORAL | 3 refills | Status: DC

## 2017-05-28 MED ORDER — ESCITALOPRAM OXALATE 10 MG PO TABS: 10.0000 mg | ORAL_TABLET | Freq: Every day | ORAL | 3 refills | Status: DC

## 2017-05-28 MED ORDER — TRAMADOL HCL 50 MG PO TABS: 50.0000 mg | ORAL_TABLET | Freq: Two times a day (BID) | ORAL | 1 refills | Status: DC | PRN

## 2017-05-28 MED ORDER — TICAGRELOR 90 MG PO TABS: 90.0000 mg | ORAL_TABLET | Freq: Two times a day (BID) | ORAL | 6 refills | Status: DC

## 2017-05-28 MED ORDER — ATORVASTATIN CALCIUM 40 MG PO TABS: 40.0000 mg | ORAL_TABLET | Freq: Every day | ORAL | 3 refills | Status: DC

## 2017-05-28 MED ORDER — LABETALOL HCL 100 MG PO TABS: 100.0000 mg | ORAL_TABLET | Freq: Two times a day (BID) | ORAL | 3 refills | Status: DC

## 2017-05-28 MED ORDER — LOSARTAN POTASSIUM 50 MG PO TABS: 50.0000 mg | ORAL_TABLET | Freq: Every day | ORAL | 3 refills | Status: DC

## 2017-05-28 MED ORDER — IPRATROPIUM-ALBUTEROL 0.5-2.5 (3) MG/3ML IN SOLN: 3.0000 mL | Freq: Four times a day (QID) | RESPIRATORY_TRACT | 3 refills | Status: DC | PRN

## 2017-05-28 MED ORDER — PREDNISONE 10 MG PO TABS: ORAL_TABLET | ORAL | 0 refills | Status: DC

## 2017-05-28 MED ORDER — FLUTICASONE FUROATE-VILANTEROL 200-25 MCG/INH IN AEPB: 1.0000 | INHALATION_SPRAY | Freq: Every day | RESPIRATORY_TRACT | 3 refills | Status: DC

## 2017-05-28 MED ORDER — MONTELUKAST SODIUM 10 MG PO TABS: 10.0000 mg | ORAL_TABLET | Freq: Every day | ORAL | 3 refills | Status: DC

## 2017-05-28 MED ORDER — OMEPRAZOLE 40 MG PO CPDR: 40.0000 mg | DELAYED_RELEASE_CAPSULE | Freq: Every day | ORAL | 3 refills | Status: DC

## 2017-05-28 MED ORDER — LEVOTHYROXINE SODIUM 100 MCG PO TABS: 100.0000 ug | ORAL_TABLET | Freq: Every day | ORAL | 6 refills | Status: DC

## 2017-05-28 MED FILL — ESCITALOPRAM 10 MG TABLET: 10 | 30 days supply | Qty: 30 | Fill #0

## 2017-05-28 MED FILL — LOSARTAN POTASSIUM 50 MG TA: 50 | 30 days supply | Qty: 30 | Fill #0

## 2017-05-28 MED FILL — predniSONE 10 MG TABS: 10 | 12 days supply | Qty: 30 | Fill #0

## 2017-05-28 MED FILL — OMEPRAZOLE DR 40 MG CAPSULE: 40 | 30 days supply | Qty: 30 | Fill #0

## 2017-05-28 MED FILL — IPRAT-ALBUT 0.5-3(2.5) MG/3: 0.5-2.5 (3) | 7 days supply | Qty: 90 | Fill #3

## 2017-05-28 MED FILL — !BREO ELLIPTA 200-25 MCG: 200-25 | 30 days supply | Qty: 60 | Fill #0

## 2017-05-28 MED FILL — traMADol HCL 50 MG TABS: 50 | 30 days supply | Qty: 60 | Fill #0

## 2017-05-28 MED FILL — ONDANSETRON HCL 4 MG TABLET: 4 | 6 days supply | Qty: 20 | Fill #0

## 2017-05-28 MED FILL — BRILINTA 90 MG TABLET: 90 | 8 days supply | Qty: 16 | Fill #0

## 2017-05-28 NOTE — Assessment & Plan Note (Signed)
Not taking statin Has CAD Plan  Refill statin

## 2017-05-28 NOTE — Assessment & Plan Note (Signed)
Ongoing tobacco use Counseled to quit smoking

## 2017-05-28 NOTE — Assessment & Plan Note (Signed)
2 mm LLL lung nodule, has been followed since 2011, no change Not seen on current CXR

## 2017-05-28 NOTE — Assessment & Plan Note (Signed)
See emesis assessment

## 2017-05-28 NOTE — Patient Instructions (Signed)
Labs today: BMET Resume prednisone 10mg  Take 4 for three days 3 for three days 2 for three days 1 for three days and stop All medications refilled Return 1 month for recheck with Dr Joya Gaskins Needs primary care follow visit : we will arrange

## 2017-05-28 NOTE — Assessment & Plan Note (Signed)
Pre diabetic HgbA1C pending

## 2017-05-28 NOTE — Assessment & Plan Note (Signed)
Not taking thyroid meds Plan  Refill levothyroxine Needs PCP f/u

## 2017-05-28 NOTE — Assessment & Plan Note (Addendum)
HTN, poorly controlled due to lack of medications Plan Refilled labetalol and losartan Refilled lasix Check BMET

## 2017-05-28 NOTE — Assessment & Plan Note (Signed)
Needs to resume brilenta Cont ASA 81mg  and resume statins

## 2017-05-28 NOTE — Progress Notes (Signed)
Subjective:    Patient ID: [name] [name], female    DOB: 07/27/1955, 61 y.o.   MRN: [id number]  61 y.o.F with COPD.  Here for ED f/u.   COPD dx x 67yr.  Has seen Dr YAnnamaria Bootsin the past.   Smoking < 1PPD.  Notes every three weeks with bronchitis.  On neb meds 2xperday Pt with nausea and emesis x one week.  No blood.   Has mold in apt.  No HVAC filter change [address]    Shortness of Breath  This is a chronic problem. Associated symptoms include abdominal pain, headaches, rhinorrhea, a sore throat, sputum production, vomiting and wheezing. Pertinent negatives include no chest pain, ear pain, fever, hemoptysis, leg pain, leg swelling, orthopnea, PND, swollen glands or syncope. The symptoms are aggravated by weather changes, URIs, any activity and exercise. Associated symptoms comments: Mucus is clear.  N/V x 1 week abd pain is cramping.  No diarrhea R ear itches Notes heartburn. Risk factors include smoking. She has tried beta agonist inhalers for the symptoms. The treatment provided moderate relief. Her past medical history is significant for allergies, asthma, CAD, COPD and pneumonia. There is no history of PE.   Past Medical History:  Diagnosis Date  . Anxiety    takes Lexapro daily  . Arthritis    "back, from neck down pass my bra area" (03/25/2017)  . Asthma   . Bartholin gland cyst 08/29/2011  . Bruises easily    pt is on Effient  . Chronic back pain    herniated nucleus pulposus  . Chronic back pain    "neck to bra area; lower back" (03/25/2017)  . COPD [place]    early stages  . Coronary artery disease   . Depression    takes Klonopin daily  . Diverticulosis   . GERD (gastroesophageal reflux disease)    takes Nexium daily  . H/O hiatal hernia   . Heart attack ([place] 2011  . Hemorrhoids   . Hernia   . Hyperlipidemia    takes Lipitor daily  . Hypertension    takes Losartan daily and Labetalol bid  . Hypothyroidism    takes  Synthroid daily  . Insomnia    hydroxyzine prn  . Joint pain   . Pneumonia    "couple times" (03/25/2017)  . Pre-diabetes    "just found out 1 wk ago" (03/25/2017)  . Psoriasis    elbows,knees,back  . Shortness of breath    with exertion  . Slowing of urinary stream   . Stress incontinence      Family History  Problem Relation Age of Onset  . Heart disease Mother   . Hypertension Mother   . Stroke Mother   . Heart disease Father   . Hypertension Father   . Diabetes Father   . Cancer Maternal Aunt        breast  . Thyroid disease Paternal Aunt   . Heart disease Maternal Grandfather   . Anesthesia problems Daughter   . Cancer Paternal Grandmother        mouth  . Hypotension Neg Hx   . Malignant hyperthermia Neg Hx   . Pseudochol deficiency Neg Hx      Social History   Socioeconomic History  . Marital status: Divorced    Spouse name: Not on file  . Number of children: Not on file  . Years of education: Not on file  . Highest education level: Not on  file  Social Needs  . Financial resource strain: Not on file  . Food insecurity - worry: Not on file  . Food insecurity - inability: Not on file  . Transportation needs - medical: Not on file  . Transportation needs - non-medical: Not on file  Occupational History  . Not on file  Tobacco Use  . Smoking status: Current Every Day Smoker    Packs/day: 1.00    Years: 49.00    Pack years: 49.00    Types: Cigarettes  . Smokeless tobacco: Never Used  Substance and Sexual Activity  . Alcohol use: Yes    Comment: 03/25/2017 "glass of wine once in a blue moon; maybe q 5-6 months"  . Drug use: No    Comment: 03/25/2017 "used cocaine a couple days ago; smoke pot from time to time"  . Sexual activity: Not Currently    Birth control/protection: Surgical  Other Topics Concern  . Not on file  Social History Narrative  . Not on file     Allergies  Allergen Reactions  . Other Anaphylaxis    avocados  . Codeine Nausea Only   . Latex Rash    REACTION: shortness of breath and rash     Outpatient Medications Prior to Visit  Medication Sig Dispense Refill  . aspirin 81 MG chewable tablet Chew 1 tablet (81 mg total) daily by mouth.    . Blood Glucose Monitoring Suppl (TRUE METRIX METER) DEVI 1 kit by Does not apply route 4 (four) times daily. 1 Device 0  . Calcium 600 MG tablet Take 600 mg by mouth daily.  60 tablet   . glucose blood (TRUE METRIX BLOOD GLUCOSE TEST) test strip Use as instructed 100 each 12  . TRUEPLUS LANCETS 28G MISC 28 g by Does not apply route QID. 120 each 2  . ipratropium-albuterol (DUONEB) 0.5-2.5 (3) MG/3ML SOLN Take 3 mLs by nebulization every 6 (six) hours as needed. (Patient taking differently: Take 3 mLs by nebulization every 6 (six) hours as needed (wheezing). ) 360 mL 0  . albuterol (VENTOLIN HFA) 108 (90 Base) MCG/ACT inhaler Inhale 2 puffs every 6 (six) hours as needed into the lungs for wheezing or shortness of breath. (Patient not taking: Reported on 05/28/2017) 18 g 2  . cyclobenzaprine (FLEXERIL) 10 MG tablet Take 1 tablet (10 mg total) by mouth 3 (three) times daily as needed. (Patient not taking: Reported on 05/28/2017) 30 tablet 2  . fluocinolone (VANOS) 0.01 % cream Apply topically 2 (two) times daily. (Patient not taking: Reported on 05/28/2017) 30 g 0  . fluticasone (FLONASE) 50 MCG/ACT nasal spray Place 2 sprays at bedtime into both nostrils. (Patient not taking: Reported on 05/28/2017) 16 g 3  . furosemide (LASIX) 20 MG tablet Take 1 tablet (20 mg total) by mouth daily. (Patient not taking: Reported on 05/28/2017) 30 tablet 3  . nicotine (NICODERM CQ - DOSED IN MG/24 HOURS) 21 mg/24hr patch Place 1 patch (21 mg total) onto the skin daily. 28 patch 0  . sodium chloride (OCEAN) 0.65 % nasal spray Place 1 spray into the nose as needed for congestion. (Patient not taking: Reported on 05/28/2017) 30 mL 12  . amoxicillin (AMOXIL) 875 MG tablet Take 1 tablet (875 mg total) by mouth 2  (two) times daily. (Patient not taking: Reported on 05/28/2017) 14 tablet 0  . atorvastatin (LIPITOR) 40 MG tablet Take 1 tablet (40 mg total) daily by mouth. (Patient not taking: Reported on 05/28/2017) 30 tablet 0  .  escitalopram (LEXAPRO) 10 MG tablet Take 1 tablet (10 mg total) by mouth daily. 90 tablet 1  . fluticasone furoate-vilanterol (BREO ELLIPTA) 200-25 MCG/INH AEPB Inhale 1 puff daily into the lungs. (Patient not taking: Reported on 05/28/2017) 60 each 0  . gabapentin (NEURONTIN) 600 MG tablet TAKE 1 TABLET BY MOUTH 4 TIMES DAILY (Patient not taking: Reported on 05/28/2017) 120 tablet 0  . hydrOXYzine (VISTARIL) 25 MG capsule TAKE 25MG BY MOUTH THREE TIMES DAILY (Patient not taking: Reported on 05/28/2017) 90 capsule 0  . labetalol (NORMODYNE) 100 MG tablet Take 1 tablet (100 mg total) 2 (two) times daily by mouth. (Patient not taking: Reported on 05/28/2017) 60 tablet 0  . levothyroxine (SYNTHROID, LEVOTHROID) 100 MCG tablet Take 1 tablet (100 mcg total) daily by mouth. (Patient not taking: Reported on 05/28/2017) 30 tablet 0  . losartan (COZAAR) 50 MG tablet Take 1 tablet (50 mg total) by mouth daily. (Patient not taking: Reported on 05/28/2017) 90 tablet 0  . montelukast (SINGULAIR) 10 MG tablet Take 1 tablet (10 mg total) at bedtime by mouth. (Patient not taking: Reported on 05/28/2017) 30 tablet 0  . omeprazole (PRILOSEC) 40 MG capsule Take 1 capsule (40 mg total) by mouth daily. (Patient not taking: Reported on 05/28/2017) 30 capsule 3  . predniSONE (DELTASONE) 20 MG tablet Two daily with food (Patient not taking: Reported on 05/28/2017) 10 tablet 0  . ticagrelor (BRILINTA) 90 MG TABS tablet Take 1 tablet (90 mg total) by mouth 2 (two) times daily. (Patient not taking: Reported on 05/28/2017) 60 tablet 11  . traMADol (ULTRAM) 50 MG tablet Take 1 tablet (50 mg total) by mouth every 12 (twelve) hours as needed. (Patient not taking: Reported on 05/28/2017) 60 tablet 0   No  facility-administered medications prior to visit.      Review of Systems  Constitutional: Negative for fever.  HENT: Positive for rhinorrhea and sore throat. Negative for ear pain.   Respiratory: Positive for sputum production, shortness of breath and wheezing. Negative for hemoptysis.   Cardiovascular: Negative for chest pain, orthopnea, leg swelling, syncope and PND.  Gastrointestinal: Positive for abdominal pain and vomiting.  Neurological: Positive for headaches.       Objective:   Physical Exam Vitals:   05/28/17 1016  BP: (!) 168/69  Pulse: 73  Resp: 18  Temp: 97.7 F (36.5 C)  TempSrc: Oral  SpO2: 96%  Weight: 129 lb 12.8 oz (58.9 kg)  Height: '5\' 1"'  (1.549 m)    AYT:[id number] WF , in no distress, depressed affect  ENT: No lesions,  mouth clear,  oropharynx clear, no postnasal drip  Neck: No JVD, no TMG, no carotid bruits  Lungs: No use of accessory muscles, no dullness to percussion, expiratory wheezes, poor airflow  Cardiovascular: RRR, heart sounds normal, no murmur or gallops, no peripheral edema  Abdomen: soft   Tender in epigastric area, no rebound or guarding,  no HSM,  BS normal  Musculoskeletal: No deformities, no cyanosis or clubbing  Neuro: alert, non focal  Skin: Warm, no lesions or rashes  No results found.  9/11 CXR reviewed .  NAD.  No lung mass, copd changes 9/13 BMET Cr 0.9  K 4.3  Hgb 12.9  WBC 14.9     Assessment & Plan:  I personally reviewed all images and lab data in the [facility] system as well as any outside material available during this office visit and agree with the  radiology impressions.   Essential hypertension, benign HTN, poorly  controlled due to lack of medications Plan Refilled labetalol and losartan Refilled lasix Check BMET   Emesis, persistent Emesis likely d/t reflux Abdominal exam benign except for epigastric pain Plan chk bmet  zofran prn Resume omeprazole 68m daily  GERD (gastroesophageal reflux  disease) See emesis assessment  [place] with acute exacerbation Plan  Repulse prednisone Refill Breo Refill neb meds No oxygen needed  Lung nodule < 6cm on CT 2 mm LLL lung nodule, has been followed since 2011, no change Not seen on current CXR  Hypothyroidism Not taking thyroid meds Plan  Refill levothyroxine Needs PCP f/u  TOBACCO ABUSE Ongoing tobacco use Counseled to quit smoking  Dyslipidemia Not taking statin Has CAD Plan  Refill statin  Pre-diabetes Pre diabetic HgbA1C pending  CAD S/P percutaneous coronary angioplasty Needs to resume brilenta Cont ASA 857mand resume statins  COPD exacerbation (HCC) Copd exacerbation Prednisone pulse Get Breo filled   Diagnoses and all orders for this visit:  Essential hypertension -     Basic metabolic panel; Future -     Basic metabolic panel  Pre-diabetes -     Glucose (CBG) -     HgB A1c  Chronic low back pain, unspecified back pain laterality, with sciatica presence unspecified -     Discontinue: traMADol (ULTRAM) 50 MG tablet; Take 1 tablet (50 mg total) every 12 (twelve) hours as needed by mouth. -     traMADol (ULTRAM) 50 MG tablet; Take 1 tablet (50 mg total) every 12 (twelve) hours as needed by mouth.  Gastroesophageal reflux disease, esophagitis presence not specified -     omeprazole (PRILOSEC) 40 MG capsule; Take 1 capsule (40 mg total) daily by mouth.  Hypothyroidism, unspecified type -     levothyroxine (SYNTHROID, LEVOTHROID) 100 MCG tablet; Take 1 tablet (100 mcg total) daily by mouth.  Anxiety and depression -     hydrOXYzine (VISTARIL) 25 MG capsule; TAKE 25MG BY MOUTH THREE TIMES DAILY  Essential hypertension, benign  Emesis, persistent  Pulmonary emphysema, unspecified emphysema type (HCC)  Lung nodule < 6cm on CT  TOBACCO ABUSE  Dyslipidemia  CAD S/P percutaneous coronary angioplasty  COPD exacerbation (HCC)  Other  orders -     ticagrelor (BRILINTA) 90 MG TABS tablet; Take 1 tablet (90 mg total) 2 (two) times daily by mouth. -     predniSONE (DELTASONE) 10 MG tablet; Take 4 for three days 3 for three days 2 for three days 1 for three days and stop -     montelukast (SINGULAIR) 10 MG tablet; Take 1 tablet (10 mg total) at bedtime by mouth. -     losartan (COZAAR) 50 MG tablet; Take 1 tablet (50 mg total) daily by mouth. -     labetalol (NORMODYNE) 100 MG tablet; Take 1 tablet (100 mg total) 2 (two) times daily by mouth. -     Discontinue: ipratropium-albuterol (DUONEB) 0.5-2.5 (3) MG/3ML SOLN; Take 3 mLs every 6 (six) hours as needed by nebulization. -     gabapentin (NEURONTIN) 600 MG tablet; TAKE 1 TABLET BY MOUTH 4 TIMES DAILY -     fluticasone furoate-vilanterol (BREO ELLIPTA) 200-25 MCG/INH AEPB; Inhale 1 puff daily into the lungs. -     escitalopram (LEXAPRO) 10 MG tablet; Take 1 tablet (10 mg total) daily by mouth. -     atorvastatin (LIPITOR) 40 MG tablet; Take 1 tablet (40 mg total) daily by mouth. -     ondansetron (ZOFRAN)  4 MG tablet; Take 1 tablet (4 mg total) every 8 (eight) hours as needed by mouth for nausea or vomiting. -     ipratropium-albuterol (DUONEB) 0.5-2.5 (3) MG/3ML SOLN; Take 3 mLs every 6 (six) hours as needed by nebulization.    I had an extended discussion with the patient and or family lasting 10 minutes of a 25 minute visit including: smoking cessation, reviewed all meds,

## 2017-05-28 NOTE — Assessment & Plan Note (Signed)
Emesis likely d/t reflux Abdominal exam benign except for epigastric pain Plan chk bmet  zofran prn Resume omeprazole 40mg  daily

## 2017-05-28 NOTE — Assessment & Plan Note (Signed)
" >>  ASSESSMENT AND PLAN FOR COPD EXACERBATION (HCC) WRITTEN ON 05/28/2017 11:17 AM BY Reace Breshears E, MD  Copd exacerbation Prednisone  pulse Get Breo filled "

## 2017-05-28 NOTE — Assessment & Plan Note (Signed)
Copd with acute exacerbation Plan  Repulse prednisone Refill Breo Refill neb meds No oxygen needed

## 2017-05-28 NOTE — Assessment & Plan Note (Signed)
Copd exacerbation Prednisone pulse Get Breo filled

## 2017-05-29 LAB — BASIC METABOLIC PANEL
BUN / CREAT RATIO: 12 (ref 12–28)
BUN: 10 mg/dL (ref 8–27)
CALCIUM: 9.9 mg/dL (ref 8.7–10.3)
CHLORIDE: 101 mmol/L (ref 96–106)
CO2: 23 mmol/L (ref 20–29)
CREATININE: 0.82 mg/dL (ref 0.57–1.00)
GFR calc Af Amer: 90 mL/min/{1.73_m2} (ref >59)
GFR calc non Af Amer: 78 mL/min/{1.73_m2} (ref >59)
GLUCOSE: 100 mg/dL — AB (ref 65–99)
Potassium: 4.5 mmol/L (ref 3.5–5.2)
Sodium: 141 mmol/L (ref 134–144)

## 2017-05-29 MED FILL — ?CYCLOBENZAPRINE 10 MG TABL: 10 | 10 days supply | Qty: 30 | Fill #2

## 2017-05-29 MED FILL — ?MONTELUKAST SOD 10MG TAB: 10 | 30 days supply | Qty: 30 | Fill #0

## 2017-05-29 MED FILL — ?FUROSEMIDE 20 MG TABLET: 20 | 30 days supply | Qty: 30 | Fill #0

## 2017-05-29 MED FILL — GABAPENTIN 600 MG TABS: 600 | 30 days supply | Qty: 120 | Fill #0

## 2017-05-29 NOTE — Progress Notes (Signed)
Im sorry I did not meant to order it under your name it was just the routine that im use to order under your name that was my mistake.

## 2017-05-30 ENCOUNTER — Telehealth: Payer: Self-pay

## 2017-05-30 NOTE — Telephone Encounter (Signed)
CMA call regarding lab results   Patient daughter answer & stated that they saw her results in my chart   Patient daughter was aware and understood

## 2017-05-30 NOTE — Progress Notes (Signed)
Done

## 2017-05-30 NOTE — Telephone Encounter (Signed)
-----   Message from Elsie Stain, MD sent at 05/30/2017  3:48 PM EST ----- [name] [name],  Please call the patient and let her know her labs are all ok.  No changes in medications.  Dr Joya Gaskins

## 2017-06-09 MED FILL — BRILINTA 90 MG TABLET: 90 | 8 days supply | Qty: 16 | Fill #1

## 2017-06-18 ENCOUNTER — Ambulatory Visit: Payer: Self-pay | Admitting: Internal Medicine

## 2017-06-18 ENCOUNTER — Ambulatory Visit: Payer: Self-pay | Admitting: Critical Care Medicine

## 2017-06-26 MED FILL — LOSARTAN POTASSIUM 50 MG TA: 50 | 30 days supply | Qty: 30 | Fill #1

## 2017-06-26 MED FILL — ?FUROSEMIDE 20 MG TABLET: 20 | 30 days supply | Qty: 30 | Fill #1

## 2017-06-26 MED FILL — ?ATORVASTATIN 40MG TABLET: 40 | 30 days supply | Qty: 30 | Fill #0

## 2017-06-26 MED FILL — BRILINTA 90 MG TABLET: 90 | 8 days supply | Qty: 16 | Fill #2

## 2017-06-26 MED FILL — ?MONTELUKAST SOD 10MG TAB: 10 | 30 days supply | Qty: 30 | Fill #0

## 2017-06-26 MED FILL — LABETALOL HCL 100 MG TABLET: 100 | 30 days supply | Qty: 60 | Fill #0

## 2017-06-26 MED FILL — !BREO ELLIPTA 200-25 MCG: 200-25 | 30 days supply | Qty: 60 | Fill #1

## 2017-06-26 MED FILL — IPRAT-ALBUT 0.5-3(2.5) MG/3: 0.5-2.5 (3) | 7 days supply | Qty: 90 | Fill #0

## 2017-06-26 MED FILL — ESCITALOPRAM 10 MG TABLET: 10 | 30 days supply | Qty: 30 | Fill #1

## 2017-06-26 MED FILL — TRUE METRIX GLUCOSE TEST ST: 30 days supply | Qty: 100 | Fill #1

## 2017-06-26 MED FILL — OMEPRAZOLE DR 40 MG CAPSULE: 40 | 30 days supply | Qty: 30 | Fill #1

## 2017-06-30 ENCOUNTER — Ambulatory Visit: Payer: Self-pay | Admitting: Internal Medicine

## 2017-07-01 MED FILL — HYDROXYZINE PAM 25 MG CAP: 25 | 30 days supply | Qty: 90 | Fill #0

## 2017-07-04 MED FILL — LEVOTHYROXINE 100 MCG TAB: 100 | 30 days supply | Qty: 30 | Fill #0

## 2017-07-10 ENCOUNTER — Other Ambulatory Visit: Payer: Self-pay | Admitting: Pharmacist

## 2017-07-10 MED ORDER — NICOTINE 21 MG/24HR TD PT24: 21.0000 mg | MEDICATED_PATCH | Freq: Every day | TRANSDERMAL | 0 refills | Status: DC

## 2017-07-10 MED FILL — BRILINTA 90 MG TABLET: 90 | 8 days supply | Qty: 16 | Fill #3

## 2017-07-10 MED FILL — NICOTINE 21 MG/24HR PATCH: 21 | 28 days supply | Qty: 28 | Fill #0

## 2017-07-10 MED FILL — GABAPENTIN 600 MG TABLET: 600 | 30 days supply | Qty: 120 | Fill #0

## 2017-07-14 ENCOUNTER — Ambulatory Visit (HOSPITAL_COMMUNITY)
Admission: EM | Admit: 2017-07-14 | Discharge: 2017-07-14 | Disposition: A | Discharge status: Home / Self Care | Payer: Self-pay | Attending: Physician Assistant | Admitting: Physician Assistant

## 2017-07-14 ENCOUNTER — Encounter (HOSPITAL_COMMUNITY): Payer: Self-pay | Admitting: Family Medicine

## 2017-07-14 DIAGNOSIS — R0602 Shortness of breath: Secondary | ICD-10-CM

## 2017-07-14 DIAGNOSIS — F17210 Nicotine dependence, cigarettes, uncomplicated: Secondary | ICD-10-CM

## 2017-07-14 DIAGNOSIS — L03011 Cellulitis of right finger: Secondary | ICD-10-CM

## 2017-07-14 DIAGNOSIS — L928 Other granulomatous disorders of the skin and subcutaneous tissue: Secondary | ICD-10-CM

## 2017-07-14 DIAGNOSIS — R05 Cough: Secondary | ICD-10-CM

## 2017-07-14 DIAGNOSIS — J441 Chronic obstructive pulmonary disease with (acute) exacerbation: Secondary | ICD-10-CM

## 2017-07-14 DIAGNOSIS — R062 Wheezing: Secondary | ICD-10-CM

## 2017-07-14 HISTORY — DX: Type 2 diabetes mellitus without complications: E11.9

## 2017-07-14 MED ORDER — METHYLPREDNISOLONE SODIUM SUCC 125 MG IJ SOLR
INTRAMUSCULAR | Status: AC
  Filled 2017-07-14: qty 2

## 2017-07-14 MED ORDER — IPRATROPIUM-ALBUTEROL 0.5-2.5 (3) MG/3ML IN SOLN
3.0000 mL | Freq: Once | RESPIRATORY_TRACT | Status: AC
  Administered 2017-07-14: 3 mL via RESPIRATORY_TRACT

## 2017-07-14 MED ORDER — DOXYCYCLINE HYCLATE 100 MG PO CAPS: 100.0000 mg | ORAL_CAPSULE | Freq: Two times a day (BID) | ORAL | 0 refills | Status: AC

## 2017-07-14 MED ORDER — METHYLPREDNISOLONE SODIUM SUCC 125 MG IJ SOLR
125.0000 mg | Freq: Once | INTRAMUSCULAR | Status: AC
  Administered 2017-07-14: 125 mg via INTRAMUSCULAR

## 2017-07-14 MED ORDER — IPRATROPIUM-ALBUTEROL 0.5-2.5 (3) MG/3ML IN SOLN
RESPIRATORY_TRACT | Status: AC
  Filled 2017-07-14: qty 3

## 2017-07-14 MED ORDER — PREDNISONE 20 MG PO TABS: ORAL_TABLET | ORAL | 0 refills | Status: DC

## 2017-07-14 NOTE — Discharge Instructions (Signed)
Please try to reduce your smoking and when starting a neb please finish the neb.   Continue to soak the finger.  Come back if you have any trouble.

## 2017-07-14 NOTE — ED Triage Notes (Signed)
Pt here for abscess to right index finger with rash. Also reports COPD flare up.

## 2017-07-14 NOTE — ED Provider Notes (Signed)
07/14/2017 5:02 PM   DOB: 06-Mar-1956 / MRN: [id number]  SUBJECTIVE:  [name] [name] is a 61 y.o. female presenting for paronychia and a copd flare.   Finger pain started 3 days ago and seems to be getting worse.  Patient tried to pop and manipulate and made the pain worse. She has tramadol and will take aleve for pain.   She is allergic to other; codeine; and latex.   She  has a past medical history of Anxiety, Arthritis, Asthma, Bartholin gland cyst (08/29/2011), Bruises easily, Chronic back pain, Chronic back pain, COPD (chronic obstructive pulmonary disease) ([place]), Coronary artery disease, Depression, Diabetes mellitus without complication ([place]), Diverticulosis, GERD (gastroesophageal reflux disease), H/O hiatal hernia, Heart attack ([place]) (2011), Hemorrhoids, Hernia, Hyperlipidemia, Hypertension, Hypothyroidism, Insomnia, Joint pain, Pneumonia, Pre-diabetes, Psoriasis, Shortness of breath, Slowing of urinary stream, and Stress incontinence.    She  reports that she has been smoking cigarettes.  She has a 49.00 pack-year smoking history. she has never used smokeless tobacco. She reports that she drinks alcohol. She reports that she does not use drugs. She  reports that she does not currently engage in sexual activity. She reports using the following method of birth control/protection: Surgical. The patient  has a past surgical history that includes Tonsillectomy; Colonoscopy; Lumbar laminectomy/decompression microdiscectomy (08/05/2011); Esophagogastroduodenoscopy; Back surgery; Coronary angioplasty with stent (2011 X2); Abdominal exploration surgery (1977); Abdominal hysterectomy (1977); Dilation and curettage of uterus; and LEFT HEART CATH AND CORONARY ANGIOGRAPHY (N/A, 03/26/2017).  Her family history includes Anesthesia problems in her daughter; Cancer in her maternal aunt and paternal grandmother; Diabetes in her father; Heart disease in her father, maternal grandfather, and mother; Hypertension in  her father and mother; Stroke in her mother; Thyroid disease in her paternal aunt.  Review of Systems  Constitutional: Negative for chills, diaphoresis and fever.  Respiratory: Positive for cough, sputum production, shortness of breath and wheezing. Negative for hemoptysis.   Cardiovascular: Negative for chest pain, orthopnea and leg swelling.  Gastrointestinal: Negative for nausea.  Musculoskeletal: Positive for joint pain.  Skin: Negative for rash.  Neurological: Negative for dizziness.    OBJECTIVE:  BP (!) 143/90   Pulse 75   Temp 98.6 F (37 C)   Resp 18   SpO2 100%   Physical Exam  Constitutional: She appears well-developed and well-nourished.  Cardiovascular: Normal rate and regular rhythm.  Pulmonary/Chest: Effort normal. No respiratory distress. She has wheezes (cleared with one round of duoneb). She has no rales. She exhibits no tenderness.  Musculoskeletal: Normal range of motion. She exhibits tenderness.       Hands:   Lab Results  Component Value Date   HGBA1C 6.2 05/28/2017     No results found for this or any previous visit (from the past 72 hour(s)).  No results found.  ASSESSMENT AND PLAN:  The primary encounter diagnosis was Paronychia of finger, right. A diagnosis of COPD exacerbation ([place]) was also pertinent to this visit. Her pain seemed to be arising from granulation tissue. I excised this and applied silver nitrate to the lesion.  Breathing improved with one round of duoneb. She tells me she has a flare about once every 3 weeks.  Will go ahead and provide steroid IM as well as taper at home.  She is prediabetic. With a current a1c.     The patient is advised to call or return to clinic if she does not see an improvement in symptoms, or to seek the care of the closest emergency  department if she worsens with the above plan.   Philis Fendt, MHS, PA-C 07/14/2017 5:02 PM    Tereasa Coop, PA-C 07/14/17 1704

## 2017-07-21 MED FILL — traMADol HCL 50 MG TABS: 50 | 30 days supply | Qty: 60 | Fill #1

## 2017-07-21 MED FILL — BRILINTA 90 MG TABLET: 90 | 8 days supply | Qty: 16 | Fill #4

## 2017-07-21 MED FILL — CYCLOBENZAPRINE 10 MG TAB: 10 | 10 days supply | Qty: 30 | Fill #0

## 2017-07-22 ENCOUNTER — Ambulatory Visit: Payer: Self-pay | Attending: Internal Medicine | Admitting: Internal Medicine

## 2017-07-22 ENCOUNTER — Encounter: Payer: Self-pay | Admitting: Internal Medicine

## 2017-07-22 VITALS — BP 158/85 | HR 80 | Temp 98.9°F | Resp 16 | Wt 124.8 lb

## 2017-07-22 DIAGNOSIS — F17200 Nicotine dependence, unspecified, uncomplicated: Secondary | ICD-10-CM

## 2017-07-22 DIAGNOSIS — J449 Chronic obstructive pulmonary disease, unspecified: Secondary | ICD-10-CM | POA: Insufficient documentation

## 2017-07-22 DIAGNOSIS — Z7982 Long term (current) use of aspirin: Secondary | ICD-10-CM | POA: Insufficient documentation

## 2017-07-22 DIAGNOSIS — K219 Gastro-esophageal reflux disease without esophagitis: Secondary | ICD-10-CM | POA: Insufficient documentation

## 2017-07-22 DIAGNOSIS — Z79899 Other long term (current) drug therapy: Secondary | ICD-10-CM | POA: Insufficient documentation

## 2017-07-22 DIAGNOSIS — R7303 Prediabetes: Secondary | ICD-10-CM | POA: Insufficient documentation

## 2017-07-22 DIAGNOSIS — M545 Low back pain: Secondary | ICD-10-CM | POA: Insufficient documentation

## 2017-07-22 DIAGNOSIS — E039 Hypothyroidism, unspecified: Secondary | ICD-10-CM | POA: Insufficient documentation

## 2017-07-22 DIAGNOSIS — F17210 Nicotine dependence, cigarettes, uncomplicated: Secondary | ICD-10-CM | POA: Insufficient documentation

## 2017-07-22 DIAGNOSIS — I10 Essential (primary) hypertension: Secondary | ICD-10-CM | POA: Insufficient documentation

## 2017-07-22 DIAGNOSIS — G8929 Other chronic pain: Secondary | ICD-10-CM | POA: Insufficient documentation

## 2017-07-22 DIAGNOSIS — L309 Dermatitis, unspecified: Secondary | ICD-10-CM | POA: Insufficient documentation

## 2017-07-22 DIAGNOSIS — L409 Psoriasis, unspecified: Secondary | ICD-10-CM | POA: Insufficient documentation

## 2017-07-22 DIAGNOSIS — Z955 Presence of coronary angioplasty implant and graft: Secondary | ICD-10-CM | POA: Insufficient documentation

## 2017-07-22 DIAGNOSIS — F329 Major depressive disorder, single episode, unspecified: Secondary | ICD-10-CM | POA: Insufficient documentation

## 2017-07-22 DIAGNOSIS — I2510 Atherosclerotic heart disease of native coronary artery without angina pectoris: Secondary | ICD-10-CM | POA: Insufficient documentation

## 2017-07-22 DIAGNOSIS — F419 Anxiety disorder, unspecified: Secondary | ICD-10-CM | POA: Insufficient documentation

## 2017-07-22 DIAGNOSIS — E785 Hyperlipidemia, unspecified: Secondary | ICD-10-CM | POA: Insufficient documentation

## 2017-07-22 DIAGNOSIS — F1910 Other psychoactive substance abuse, uncomplicated: Secondary | ICD-10-CM | POA: Insufficient documentation

## 2017-07-22 MED ORDER — LOSARTAN POTASSIUM 50 MG PO TABS: 75.0000 mg | ORAL_TABLET | Freq: Every day | ORAL | 6 refills | Status: DC

## 2017-07-22 MED ORDER — TRIAMCINOLONE ACETONIDE 0.1 % EX CREA: 1.0000 "application " | TOPICAL_CREAM | Freq: Two times a day (BID) | CUTANEOUS | 0 refills | Status: DC

## 2017-07-22 MED FILL — TRIAMCINOLONE ACETONIDE 0.1: 0.1 | 10 days supply | Qty: 30 | Fill #0

## 2017-07-22 MED FILL — LOSARTAN POTASSIUM 50 MG TA: 50 | 30 days supply | Qty: 45 | Fill #0

## 2017-07-22 NOTE — Patient Instructions (Addendum)
Give appointment with Carilyn Goodpasture in 3 weeks for UDS.   Eczema Eczema is a broad term for a group of skin conditions that cause skin to become rough and inflamed. Each type of eczema has different triggers, symptoms, and treatments. Eczema of any type is usually itchy and symptoms range from mild to severe. Eczema and its symptoms are not spread from person to person (are not contagious). It can appear on different parts of the body at different times. Your eczema may not look the same as someone else's eczema. What are the types of eczema? Atopic dermatitis This is a long-term (chronic) skin disease that keeps coming back (recurring). Usual symptoms are dry skin and small, solid pimples that may swell and leak fluid (weep). Contact dermatitis This happens when something irritates the skin and causes a rash. The irritation can come from substances that you are allergic to (allergens), such as poison ivy, chemicals, or medicines that were applied to your skin. Dyshidrotic eczema This is a form of eczema on the hands and feet. It shows up as very itchy, fluid-filled blisters. It can affect people of any age, but is more common before age 79. Hand eczema This causes very itchy areas of skin on the palms and sides of the hands and fingers. This type of eczema is common in industrial jobs where you may be exposed to many different types of irritants. Lichen simplex chronicus This type of eczema occurs when a person constantly scratches one area of the body. Repeated scratching of the area leads to thickened skin (lichenification). Lichen simplex chronicus can occur along with other types of eczema. It is more common in adults, but may be seen in children as well. Nummular eczema This is a common type of eczema. It has no known cause. It typically causes a red, circular, crusty lesion (plaque) that may be itchy. Scratching may become a habit and can cause bleeding. Nummular eczema occurs most often in people  of middle-age or older. It most often affects the hands. Seborrheic dermatitis This is a common skin disease that mainly affects the scalp. It may also affect any oily areas of the body, such as the face, sides of nose, eyebrows, ears, eyelids, and chest. It is marked by small scaling and redness of the skin (erythema). This can affect people of all ages. In infants, this condition is known as Chartered certified accountant." Stasis dermatitis This is a common skin disease that usually appears on the legs and feet. It most often occurs in people who have a condition that prevents blood from being pumped through the veins in the legs (chronic venous insufficiency). Stasis dermatitis is a chronic condition that needs long-term management. How is eczema diagnosed? Your health care provider will examine your skin and review your medical history. He or she may also give you skin patch tests. These tests involve taking patches that contain possible allergens and placing them on your back. He or she will then check in a few days to see if an allergic reaction occurred. What are the common treatments? Treatment for eczema is based on the type of eczema you have. Hydrocortisone steroid medicine can relieve itching quickly and help reduce inflammation. This medicine may be prescribed or obtained over-the-counter, depending on the strength of the medicine that is needed. Follow these instructions at home:  Take over-the-counter and prescription medicines only as told by your health care provider.  Use creams or ointments to moisturize your skin. Do not use lotions.  Learn  what triggers or irritates your symptoms. Avoid these things.  Treat symptom flare-ups quickly.  Do not itch your skin. This can make your rash worse.  Keep all follow-up visits as told by your health care provider. This is important. Where to find more information:  The American Academy of Dermatology: http://jones-macias.info/  The National Eczema Association:  www.nationaleczema.org Contact a health care provider if:  You have serious itching, even with treatment.  You regularly scratch your skin until it bleeds.  Your rash looks different than usual.  Your skin is painful, swollen, or more red than usual.  You have a fever. Summary  There are eight general types of eczema. Each type has different triggers.  Eczema of any type causes itching that may range from mild to severe.  Treatment varies based on the type of eczema you have. Hydrocortisone steroid medicine can help with itching and inflammation.  Protecting your skin is the best way to prevent eczema. Use moisturizers and lotions. Avoid triggers and irritants, and treat flare-ups quickly. This information is not intended to replace advice given to you by your health care provider. Make sure you discuss any questions you have with your health care provider. Document Released: 11/14/2016 Document Revised: 11/14/2016 Document Reviewed: 11/14/2016 Elsevier Interactive Patient Education  2018 [employer].

## 2017-07-22 NOTE — Progress Notes (Signed)
Pt is needing her cyclobenzaprine, brilinta and tramadol  Pt is also requesting a refill to a dermatologist

## 2017-07-22 NOTE — Progress Notes (Signed)
Patient ID: [name] [name], female    DOB: 07/24/1955  MRN: [id number]  CC: re-establish and Medication Refill   Subjective: [name] [name] is a 62 y.o. female who presents for chronic ds management. Sister, [name] [name], is with her Her concerns today include:  Patient with history of pre-DM, GERD, hypothyroidism, HTN, HL, CAD with previous stent, COPD, substance abuse and tobacco dependence  Patient saw PA 9/201 8 post hospital for chest pain.  She subsequently saw Dr. Joya Gaskins 05/2017 with multiple concerns having been out of most of her medications.  1. COPD: using Breo once a day. Not using Albuterol inhaler often. -uses duoneb few times a day to "clear lungs."  Sister reports she uses the duoneb to "clear her lungs" so that she can smoke without feeling too SOB Smoking 1/2-1 pk a day. Trying to quit. Has patches but uses infrequently  2. On Lexapro for depression and "bad nerves"/panic attacks Sister thinks she may have ADHD - hard for pt to stay focus or complete tasks. Request psychiatry referral Hx of substance abuse.  Will be going [facility] x [facility]. 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  Request derm referral for hands. Hands crack and peel. She has tried all types of moisturizing lotions OTC Allergic to latax  4. HTN/CAD: Compliant with Labetalol and Cozaar. Limits salt No CP  5. Request RF on Tramadol Had surgery on lumbar spine 4-5 yrs ago for sciatica Pain better since surgery But still pain over RT SI jt  Nerve damage from below RT knee to toes. + numbness and tingling. On Gabapentin for this.  Patient Active Problem List   Diagnosis Date Noted  . Pre-diabetes 05/28/2017  . Emesis, persistent 05/28/2017  . QT prolongation 03/25/2017  . Encounter for screening mammogram for breast cancer 09/11/2015  . Psoriasis 06/22/2015  . COPD (chronic obstructive pulmonary disease) ([place]) 05/18/2015  . Anxiety and depression 07/10/2013  . Essential hypertension, benign  06/07/2013  . Dyslipidemia 06/07/2013  . Asthma, chronic 06/07/2013  . Emphysema lung ([place]) 06/07/2013  . Chronic back pain 06/07/2013  . CAD S/P percutaneous coronary angioplasty 06/07/2013  . GERD (gastroesophageal reflux disease) 06/07/2013  . Breast lump on left side at 3 o'clock position 10/13/2012  . S/P abdominal hysterectomy and right salpingo-oophorectomy 08/29/2011  . COPD exacerbation ([place]) 09/15/2009  . TOBACCO ABUSE 07/17/2009  . Chronic rhinitis 07/17/2009  . Lung nodule < 6cm on CT 07/17/2009  . ALLERGY, FOOD 07/17/2009  . Hypothyroidism 12/22/2008     Current Outpatient Medications on File Prior to Visit  Medication Sig Dispense Refill  . albuterol (VENTOLIN HFA) 108 (90 Base) MCG/ACT inhaler Inhale 2 puffs every 6 (six) hours as needed into the lungs for wheezing or shortness of breath. 18 g 2  . aspirin 81 MG chewable tablet Chew 1 tablet (81 mg total) daily by mouth.    Marland Kitchen atorvastatin (LIPITOR) 40 MG tablet Take 1 tablet (40 mg total) daily by mouth. 90 tablet 3  . Calcium 600 MG tablet Take 600 mg by mouth daily.  60 tablet   . cyclobenzaprine (FLEXERIL) 10 MG tablet Take 1 tablet (10 mg total) by mouth 3 (three) times daily as needed. 30 tablet 2  . doxycycline (VIBRAMYCIN) 100 MG capsule Take 1 capsule (100 mg total) by mouth 2 (two) times daily for 10 days. 20 capsule 0  . escitalopram (LEXAPRO) 10 MG tablet Take 1 tablet (10 mg total) daily by mouth. [place]  tablet 3  . fluticasone furoate-vilanterol (BREO ELLIPTA) 200-25 MCG/INH AEPB Inhale 1 puff daily into the lungs. 60 each 3  . furosemide (LASIX) 20 MG tablet Take 1 tablet (20 mg total) by mouth daily. 30 tablet 3  . gabapentin (NEURONTIN) 600 MG tablet TAKE 1 TABLET BY MOUTH 4 TIMES DAILY 120 tablet 3  . hydrOXYzine (VISTARIL) 25 MG capsule TAKE 25MG BY MOUTH THREE TIMES DAILY 90 capsule 3  . labetalol (NORMODYNE) 100 MG tablet Take 1 tablet (100 mg total) 2 (two) times daily by mouth. 60 tablet 3  .  levothyroxine (SYNTHROID, LEVOTHROID) 100 MCG tablet Take 1 tablet (100 mcg total) daily by mouth. 30 tablet 6  . losartan (COZAAR) 50 MG tablet Take 1 tablet (50 mg total) daily by mouth. 90 tablet 3  . montelukast (SINGULAIR) 10 MG tablet Take 1 tablet (10 mg total) at bedtime by mouth. 30 tablet 3  . omeprazole (PRILOSEC) 40 MG capsule Take 1 capsule (40 mg total) daily by mouth. 30 capsule 3  . predniSONE (DELTASONE) 20 MG tablet Take 3 in the morning for 3 days, then 2 in the morning for 3 days, and then 1 in the morning for 3 days. 18 tablet 0  . ticagrelor (BRILINTA) 90 MG TABS tablet Take 1 tablet (90 mg total) 2 (two) times daily by mouth. 60 tablet 6  . traMADol (ULTRAM) 50 MG tablet Take 1 tablet (50 mg total) every 12 (twelve) hours as needed by mouth. 60 tablet 1  . Blood Glucose Monitoring Suppl (TRUE METRIX METER) DEVI 1 kit by Does not apply route 4 (four) times daily. (Patient not taking: Reported on 07/22/2017) 1 Device 0  . glucose blood (TRUE METRIX BLOOD GLUCOSE TEST) test strip Use as instructed (Patient not taking: Reported on 07/22/2017) 100 each 12  . ipratropium-albuterol (DUONEB) 0.5-2.5 (3) MG/3ML SOLN Take 3 mLs every 6 (six) hours as needed by nebulization. (Patient not taking: Reported on 07/22/2017) 360 mL 3  . nicotine (NICODERM CQ - DOSED IN MG/24 HOURS) 21 mg/24hr patch Place 1 patch (21 mg total) onto the skin daily. 28 patch 0  . ondansetron (ZOFRAN) 4 MG tablet Take 1 tablet (4 mg total) every 8 (eight) hours as needed by mouth for nausea or vomiting. (Patient not taking: Reported on 07/22/2017) 20 tablet 0  . TRUEPLUS LANCETS 28G MISC 28 g by Does not apply route QID. 120 each 2   No current facility-administered medications on file prior to visit.     Allergies  Allergen Reactions  . Other Anaphylaxis    avocados  . Codeine Nausea Only  . Latex Rash    REACTION: shortness of breath and rash    Social History   Socioeconomic History  . Marital status:  Divorced    Spouse name: Not on file  . Number of children: Not on file  . Years of education: Not on file  . Highest education level: Not on file  Social Needs  . Financial resource strain: Not on file  . Food insecurity - worry: Not on file  . Food insecurity - inability: Not on file  . Transportation needs - medical: Not on file  . Transportation needs - non-medical: Not on file  Occupational History  . Not on file  Tobacco Use  . Smoking status: Current Every Day Smoker    Packs/day: 1.00    Years: 49.00    Pack years: 49.00    Types: Cigarettes  . Smokeless tobacco: Never  Used  Substance and Sexual Activity  . Alcohol use: Yes    Comment: 03/25/2017 "glass of wine once in a blue moon; maybe q 5-6 months"  . Drug use: No    Comment: 03/25/2017 "used cocaine a couple days ago; smoke pot from time to time"  . Sexual activity: Not Currently    Birth control/protection: Surgical  Other Topics Concern  . Not on file  Social History Narrative  . Not on file    Family History  Problem Relation Age of Onset  . Heart disease Mother   . Hypertension Mother   . Stroke Mother   . Heart disease Father   . Hypertension Father   . Diabetes Father   . Cancer Maternal Aunt        breast  . Thyroid disease Paternal Aunt   . Heart disease Maternal Grandfather   . Anesthesia problems Daughter   . Cancer Paternal Grandmother        mouth  . Hypotension Neg Hx   . Malignant hyperthermia Neg Hx   . Pseudochol deficiency Neg Hx     Past Surgical History:  Procedure Laterality Date  . ABDOMINAL EXPLORATION SURGERY  1977  . ABDOMINAL HYSTERECTOMY  1977   "left one of my ovaries"  . BACK SURGERY    . COLONOSCOPY    . CORONARY ANGIOPLASTY WITH STENT PLACEMENT  2011 X2   "regular stents didn't work; had to go back in in ~ 1 month and put in medicated stents"  . DILATION AND CURETTAGE OF UTERUS    . ESOPHAGOGASTRODUODENOSCOPY    . LEFT HEART CATH AND CORONARY ANGIOGRAPHY N/A  03/26/2017   Procedure: LEFT HEART CATH AND CORONARY ANGIOGRAPHY;  Surgeon: Belva Crome, MD;  Location: [facility];  Service: Cardiovascular;  Laterality: N/A;  . LUMBAR LAMINECTOMY/DECOMPRESSION MICRODISCECTOMY  08/05/2011   Procedure: LUMBAR LAMINECTOMY/DECOMPRESSION MICRODISCECTOMY;  Surgeon: Otilio Connors, MD;  Location: [facility];  Service: Neurosurgery;  Laterality: Right;  Right Lumbar four-five extraforaminal discectomy  . TONSILLECTOMY     as a child    ROS: Review of Systems Neg except as above PHYSICAL EXAM: BP (!) 158/85   Pulse 80   Temp 98.9 F (37.2 C) (Oral)   Resp 16   Wt 124 lb 12.8 oz (56.6 kg)   SpO2 95%   BMI 23.58 kg/m   Physical Exam General appearance - alert, well appearing, older caucasian FM and in no distress Mental status - alert, oriented to person, place, and time Mouth - mucous membranes moist, pharynx normal without lesions Neck - supple, no significant adenopathy Chest - breath sounds dec BL Heart - normal rate, regular rhythm, normal S1, S2, no murmurs, rubs, clicks or gallops Musculoskeletal -no tenderness on palpation of LS spine Extremities - no LE edema Skin: dry, cracked skin on palmar surface of hands  Results for orders placed or performed in visit on 74/16/38  Basic metabolic panel  Result Value Ref Range   Glucose 100 (H) 65 - 99 mg/dL   BUN 10 8 - 27 mg/dL   Creatinine, Ser 0.82 0.57 - 1.00 mg/dL   GFR calc non Af Amer 78 >59 mL/min/1.73   GFR calc Af Amer 90 >59 mL/min/1.73   BUN/Creatinine Ratio 12 12 - 28   Sodium 141 134 - 144 mmol/L   Potassium 4.5 3.5 - 5.2 mmol/L   Chloride 101 96 - 106 mmol/L   CO2 23 20 - 29 mmol/L  Calcium 9.9 8.7 - 10.3 mg/dL  Glucose (CBG)  Result Value Ref Range   POC Glucose 103 (A) 70 - 99 mg/dl  HgB A1c  Result Value Ref Range   Hemoglobin A1C 6.2     ASSESSMENT AND PLAN: 1. Essential hypertension Inc Losartan - losartan (COZAAR) 50 MG tablet; Take 1.5 tablets (75 mg  total) by mouth daily.  Dispense: 45 tablet; Refill: 6  2. Chronic obstructive pulmonary disease, unspecified COPD type ([place]) 3. Tobacco dependence -encouraged smoking cessation Continue Breo and Albuterol  4. Chronic right-sided low back pain without sciatica -discussed getting her on control subst prescribing agreement for the Tramadol.  When asked when was the last time she used street drugs, pt was vague stating it was a while a go. However, when I mention getting a UDS today, pt declined stating that it will be pos for cocaine. I told her that she will need to have 3 consecutive neg UDS before I can prescribe control substance for her. Pt was upset about this but eventually agreed to seeing our RN in 3 wks for the first UDS  5. Eczema of both hands - triamcinolone cream (KENALOG) 0.1 %; Apply 1 application topically 2 (two) times daily.  Dispense: 30 g; Refill: 0 - Ambulatory referral to Dermatology  6. Substance abuse ([place]) Commended her on seeking group treatment that starts this wk   7. Anxiety - Ambulatory referral to Psychiatry  Patient was given the opportunity to ask questions.  Patient verbalized understanding of the plan and was able to repeat key elements of the plan.   No orders of the defined types were placed in this encounter.    Requested Prescriptions    No prescriptions requested or ordered in this encounter    No Follow-up on file.  Karle Plumber, MD, FACP

## 2017-07-30 MED FILL — ?ATORVASTATIN 40MG TABLET: 40 | 30 days supply | Qty: 30 | Fill #1

## 2017-07-30 MED FILL — ?MONTELUKAST SOD 10 MG TAB: 10 | 30 days supply | Qty: 30 | Fill #1

## 2017-07-30 MED FILL — CYCLOBENZAPRINE 10 MG TAB: 10 | 10 days supply | Qty: 30 | Fill #1

## 2017-07-30 MED FILL — LABETALOL HCL 100 MG TABLET: 100 | 30 days supply | Qty: 60 | Fill #1

## 2017-07-30 MED FILL — ESCITALOPRAM 10 MG TABLET: 10 | 30 days supply | Qty: 30 | Fill #2

## 2017-07-30 MED FILL — LEVOTHYROXINE 100 MCG TAB: 100 | 30 days supply | Qty: 30 | Fill #1

## 2017-07-30 MED FILL — OMEPRAZOLE DR 40 MG CAPSULE: 40 | 30 days supply | Qty: 30 | Fill #2

## 2017-07-30 MED FILL — ?FUROSEMIDE 20 MG TABLET: 20 | 30 days supply | Qty: 30 | Fill #0

## 2017-07-30 MED FILL — IPRAT-ALBUT 0.5-3(2.5) MG/3: 0.5-2.5 (3) | 14 days supply | Qty: 180 | Fill #1

## 2017-07-30 MED FILL — BRILINTA 90 MG TABLET: 90 | 8 days supply | Qty: 16 | Fill #5

## 2017-08-12 ENCOUNTER — Other Ambulatory Visit: Payer: Self-pay

## 2017-08-17 ENCOUNTER — Telehealth: Payer: Self-pay | Admitting: Internal Medicine

## 2017-08-19 MED FILL — BRILINTA 90 MG TABLET: 90 | 8 days supply | Qty: 16 | Fill #6

## 2017-08-19 MED FILL — GABAPENTIN 600 MG TABLET: 600 | 30 days supply | Qty: 120 | Fill #1

## 2017-08-19 MED FILL — $VENTOLIN HFA 18G INHALER: 108 (90 BAS | 25 days supply | Qty: 18 | Fill #1

## 2017-08-19 MED FILL — HYDROXYZINE PAM 25 MG CAP: 25 | 30 days supply | Qty: 90 | Fill #1

## 2017-08-19 NOTE — Telephone Encounter (Signed)
Contacted pt and made aware of Derm decline seeing her. Pt states she understands but the cream is not working. Pt is wanting to know if there is something else to try

## 2017-08-20 NOTE — Telephone Encounter (Signed)
Noted Sent referral again  to [facility] card)

## 2017-08-27 MED FILL — ?FUROSEMIDE 20 MG TABLET: 20 | 30 days supply | Qty: 30 | Fill #1

## 2017-08-27 MED FILL — ?ATORVASTATIN 40MG TABLET: 40 | 30 days supply | Qty: 30 | Fill #2

## 2017-08-27 MED FILL — BRILINTA 90 MG TABLET: 90 | 8 days supply | Qty: 16 | Fill #7

## 2017-08-27 MED FILL — LABETALOL HCL 100 MG TABLET: 100 | 30 days supply | Qty: 60 | Fill #2

## 2017-08-27 MED FILL — LOSARTAN POTASSIUM 50 MG TA: 50 | 30 days supply | Qty: 45 | Fill #1

## 2017-08-27 MED FILL — ESCITALOPRAM 10 MG TABLET: 10 | 30 days supply | Qty: 30 | Fill #3

## 2017-08-27 MED FILL — LEVOTHYROXINE 100 MCG TAB: 100 | 30 days supply | Qty: 30 | Fill #2

## 2017-08-27 MED FILL — ?MONTELUKAST SOD 10 MG TAB: 10 | 30 days supply | Qty: 30 | Fill #0

## 2017-08-28 ENCOUNTER — Ambulatory Visit (HOSPITAL_COMMUNITY)
Admission: EM | Admit: 2017-08-28 | Discharge: 2017-08-28 | Disposition: A | Discharge status: Home / Self Care | Payer: Self-pay | Attending: Internal Medicine | Admitting: Internal Medicine

## 2017-08-28 ENCOUNTER — Encounter (HOSPITAL_COMMUNITY): Payer: Self-pay | Admitting: Emergency Medicine

## 2017-08-28 ENCOUNTER — Other Ambulatory Visit: Payer: Self-pay

## 2017-08-28 DIAGNOSIS — J441 Chronic obstructive pulmonary disease with (acute) exacerbation: Secondary | ICD-10-CM

## 2017-08-28 MED ORDER — DOXYCYCLINE HYCLATE 100 MG PO CAPS: 100.0000 mg | ORAL_CAPSULE | Freq: Two times a day (BID) | ORAL | 0 refills | Status: AC

## 2017-08-28 MED ORDER — PREDNISONE 50 MG PO TABS: 50.0000 mg | ORAL_TABLET | Freq: Every day | ORAL | 0 refills | Status: DC

## 2017-08-28 MED ORDER — BENZONATATE 200 MG PO CAPS: 200.0000 mg | ORAL_CAPSULE | Freq: Three times a day (TID) | ORAL | 0 refills | Status: AC | PRN

## 2017-08-28 NOTE — Discharge Instructions (Addendum)
Please take doxycycline twice daily for 10 days  Please take prednisone 50 mg for 5 days  He may use Tessalon as needed for cough.  Please continue Flonase for congestion, you may add in Zyrtec daily.  Please use your inhalers as prescribed, as needed  Please go to emergency room if oxygen saturation dropping, you develop worsening shortness of breath and difficulty breathing

## 2017-08-28 NOTE — ED Triage Notes (Addendum)
Pt hx of COPD, c/o runny nose, coughing, difficulty breathing. Pt speaking in full sentences, resp e/u. x1 week, pt has duo neb treatments at home, without relief.

## 2017-08-29 NOTE — ED Provider Notes (Signed)
[facility]    CSN: [id number] Arrival date & time: 08/28/17  1430     History   Chief Complaint Chief Complaint  Patient presents with  . URI    HPI KADYN CHOVAN is a 62 y.o. female history of COPD and hypertension presenting today for 1-2 weeks of congestion, drainage, worsening cough and shortness of breath.  She is also felt nauseous, had chills.  Denies any known fevers.  She has been using Flonase, duo nebs to help with breathing and congestion.  Denies vomiting, diarrhea.  She has had mild right-sided abdominal pain that worsens with nausea.  HPI  Past Medical History:  Diagnosis Date  . Anxiety    takes Lexapro daily  . Arthritis    "back, from neck down pass my bra area" (03/25/2017)  . Asthma   . Bartholin gland cyst 08/29/2011  . Bruises easily    pt is on Effient  . Chronic back pain    herniated nucleus pulposus  . Chronic back pain    "neck to bra area; lower back" (03/25/2017)  . COPD (chronic obstructive pulmonary disease) ([place])    early stages  . Coronary artery disease   . Depression    takes Klonopin daily  . Diabetes mellitus without complication ([place])   . Diverticulosis   . GERD (gastroesophageal reflux disease)    takes Nexium daily  . H/O hiatal hernia   . Heart attack ([place]) 2011  . Hemorrhoids   . Hernia   . Hyperlipidemia    takes Lipitor daily  . Hypertension    takes Losartan daily and Labetalol bid  . Hypothyroidism    takes Synthroid daily  . Insomnia    hydroxyzine prn  . Joint pain   . Pneumonia    "couple times" (03/25/2017)  . Pre-diabetes    "just found out 1 wk ago" (03/25/2017)  . Psoriasis    elbows,knees,back  . Shortness of breath    with exertion  . Slowing of urinary stream   . Stress incontinence     Patient Active Problem List   Diagnosis Date Noted  . Pre-diabetes 05/28/2017  . Emesis, persistent 05/28/2017  . QT prolongation 03/25/2017  . Encounter for screening mammogram for breast cancer  09/11/2015  . Psoriasis 06/22/2015  . COPD (chronic obstructive pulmonary disease) ([place]) 05/18/2015  . Anxiety and depression 07/10/2013  . Essential hypertension, benign 06/07/2013  . Dyslipidemia 06/07/2013  . Asthma, chronic 06/07/2013  . Emphysema lung ([place]) 06/07/2013  . Chronic back pain 06/07/2013  . CAD S/P percutaneous coronary angioplasty 06/07/2013  . GERD (gastroesophageal reflux disease) 06/07/2013  . Breast lump on left side at 3 o'clock position 10/13/2012  . S/P abdominal hysterectomy and right salpingo-oophorectomy 08/29/2011  . COPD exacerbation ([place]) 09/15/2009  . TOBACCO ABUSE 07/17/2009  . Chronic rhinitis 07/17/2009  . Lung nodule < 6cm on CT 07/17/2009  . ALLERGY, FOOD 07/17/2009  . Hypothyroidism 12/22/2008    Past Surgical History:  Procedure Laterality Date  . ABDOMINAL EXPLORATION SURGERY  1977  . ABDOMINAL HYSTERECTOMY  1977   "left one of my ovaries"  . BACK SURGERY    . COLONOSCOPY    . CORONARY ANGIOPLASTY WITH STENT PLACEMENT  2011 X2   "regular stents didn't work; had to go back in in ~ 1 month and put in medicated stents"  . DILATION AND CURETTAGE OF UTERUS    . ESOPHAGOGASTRODUODENOSCOPY    . LEFT HEART CATH AND CORONARY ANGIOGRAPHY N/A  03/26/2017   Procedure: LEFT HEART CATH AND CORONARY ANGIOGRAPHY;  Surgeon: Belva Crome, MD;  Location: [facility];  Service: Cardiovascular;  Laterality: N/A;  . LUMBAR LAMINECTOMY/DECOMPRESSION MICRODISCECTOMY  08/05/2011   Procedure: LUMBAR LAMINECTOMY/DECOMPRESSION MICRODISCECTOMY;  Surgeon: Otilio Connors, MD;  Location: [facility];  Service: Neurosurgery;  Laterality: Right;  Right Lumbar four-five extraforaminal discectomy  . TONSILLECTOMY     as a child    OB History    Gravida Para Term Preterm AB Living   _0 0 0 1   SAB TAB Ectopic Multiple Live Births   0 0 0 0         Home Medications    Prior to Admission medications   Medication Sig Start Date End Date Taking?  Authorizing Provider  albuterol (VENTOLIN HFA) 108 (90 Base) MCG/ACT inhaler Inhale 2 puffs every 6 (six) hours as needed into the lungs for wheezing or shortness of breath. 05/26/17   Tresa Garter, MD  aspirin EC 81 MG tablet Take 81 mg by mouth daily.    [provider]  atorvastatin (LIPITOR) 40 MG tablet Take 1 tablet (40 mg total) daily by mouth. Patient taking differently: Take 40 mg by mouth at bedtime.  05/28/17 11/24/17  Elsie Stain, MD  benzonatate (TESSALON) 200 MG capsule Take 1 capsule (200 mg total) by mouth 3 (three) times daily as needed for up to 7 days for cough. 08/28/17 09/04/17  Wieters, Hallie C, PA-C  Blood Glucose Monitoring Suppl (TRUE METRIX METER) DEVI 1 kit by Does not apply route 4 (four) times daily. 04/04/17   Brayton Caves, PA-C  Calcium 600 MG tablet Take 600 mg by mouth daily.  07/14/13   Patrecia Pour, NP  cyclobenzaprine (FLEXERIL) 10 MG tablet Take 1 tablet (10 mg total) by mouth 3 (three) times daily as needed. Patient taking differently: Take 10 mg by mouth 3 (three) times daily as needed for muscle spasms.  04/04/17   Brayton Caves, PA-C  doxycycline (VIBRAMYCIN) 100 MG capsule Take 1 capsule (100 mg total) by mouth 2 (two) times daily for 10 days. 08/28/17 09/07/17  Wieters, Hallie C, PA-C  escitalopram (LEXAPRO) 10 MG tablet Take 1 tablet (10 mg total) daily by mouth. 05/28/17 11/24/17  Elsie Stain, MD  fluticasone furoate-vilanterol (BREO ELLIPTA) 200-25 MCG/INH AEPB Inhale 1 puff daily into the lungs. 05/28/17   Elsie Stain, MD  furosemide (LASIX) 20 MG tablet Take 1 tablet (20 mg total) by mouth daily. 04/04/17   Brayton Caves, PA-C  gabapentin (NEURONTIN) 600 MG tablet TAKE 1 TABLET BY MOUTH 4 TIMES DAILY 05/28/17   Elsie Stain, MD  glucose blood (TRUE METRIX BLOOD GLUCOSE TEST) test strip Use as instructed 04/04/17   Brayton Caves, PA-C  hydrOXYzine (VISTARIL) 25 MG capsule TAKE 25MG BY MOUTH THREE TIMES  DAILY Patient taking differently: Take 25 mg by mouth 3 (three) times daily.  05/28/17   Elsie Stain, MD  ipratropium-albuterol (DUONEB) 0.5-2.5 (3) MG/3ML SOLN Take 3 mLs every 6 (six) hours as needed by nebulization. Patient taking differently: Take 3 mLs by nebulization every 6 (six) hours as needed (wheezing).  05/28/17   Elsie Stain, MD  labetalol (NORMODYNE) 100 MG tablet Take 1 tablet (100 mg total) 2 (two) times daily by mouth. 05/28/17   Elsie Stain, MD  levothyroxine (SYNTHROID, LEVOTHROID) 100 MCG tablet Take 1 tablet (100 mcg total) daily by mouth.  05/28/17   Elsie Stain, MD  losartan (COZAAR) 50 MG tablet Take 1.5 tablets (75 mg total) by mouth daily. 07/22/17   Ladell Pier, MD  Melatonin 10 MG SUBL Place 20 mg under the tongue at bedtime.    [provider]  montelukast (SINGULAIR) 10 MG tablet Take 1 tablet (10 mg total) at bedtime by mouth. 05/28/17   Elsie Stain, MD  nicotine (NICODERM CQ - DOSED IN MG/24 HOURS) 21 mg/24hr patch Place 1 patch (21 mg total) onto the skin daily. 07/10/17   Charlott Rakes, MD  omeprazole (PRILOSEC) 40 MG capsule Take 1 capsule (40 mg total) daily by mouth. 05/28/17   Elsie Stain, MD  ondansetron (ZOFRAN) 4 MG tablet Take 1 tablet (4 mg total) every 8 (eight) hours as needed by mouth for nausea or vomiting. 05/28/17   Elsie Stain, MD  predniSONE (DELTASONE) 50 MG tablet Take 1 tablet (50 mg total) by mouth daily for 5 days. 08/28/17 09/02/17  Wieters, Hallie C, PA-C  ticagrelor (BRILINTA) 90 MG TABS tablet Take 1 tablet (90 mg total) 2 (two) times daily by mouth. 05/28/17   Elsie Stain, MD  traMADol (ULTRAM) 50 MG tablet Take 1 tablet (50 mg total) every 12 (twelve) hours as needed by mouth. Patient taking differently: Take 50 mg by mouth every 12 (twelve) hours as needed for moderate pain.  05/28/17   Elsie Stain, MD  triamcinolone cream (KENALOG) 0.1 % Apply 1 application topically 2  (two) times daily. 07/22/17   Ladell Pier, MD  TRUEPLUS LANCETS 28G MISC 28 g by Does not apply route QID. 04/04/17   Brayton Caves, PA-C    Family History Family History  Problem Relation Age of Onset  . Heart disease Mother   . Hypertension Mother   . Stroke Mother   . Heart disease Father   . Hypertension Father   . Diabetes Father   . Cancer Maternal Aunt        breast  . Thyroid disease Paternal Aunt   . Heart disease Maternal Grandfather   . Anesthesia problems Daughter   . Cancer Paternal Grandmother        mouth  . Hypotension Neg Hx   . Malignant hyperthermia Neg Hx   . Pseudochol deficiency Neg Hx     Social History Social History   Tobacco Use  . Smoking status: Current Every Day Smoker    Packs/day: 1.00    Years: 49.00    Pack years: 49.00    Types: Cigarettes  . Smokeless tobacco: Never Used  Substance Use Topics  . Alcohol use: Yes    Comment: 03/25/2017 "glass of wine once in a blue moon; maybe q 5-6 months"  . Drug use: No    Comment: 03/25/2017 "used cocaine a couple days ago; smoke pot from time to time"     Allergies   Other; Codeine; and Latex   Review of Systems Review of Systems  Constitutional: Positive for chills. Negative for fatigue and fever.  HENT: Positive for congestion, ear pain, rhinorrhea, sinus pressure and sore throat. Negative for trouble swallowing.   Respiratory: Positive for cough, chest tightness and shortness of breath.   Cardiovascular: Negative for chest pain.  Gastrointestinal: Positive for abdominal pain and nausea. Negative for diarrhea and vomiting.  Musculoskeletal: Negative for myalgias.  Skin: Negative for rash.  Neurological: Negative for dizziness, light-headedness and headaches.     Physical Exam Triage Vital Signs  ED Triage Vitals  Enc Vitals Group     BP 08/28/17 1533 (!) 147/100     Pulse Rate 08/28/17 1533 86     Resp 08/28/17 1533 18     Temp 08/28/17 1533 98.4 F (36.9 C)     Temp src  --      SpO2 08/28/17 1533 95 %     Weight --      Height --      Head Circumference --      Peak Flow --      Pain Score 08/28/17 1535 7     Pain Loc --      Pain Edu? --      Excl. in [place]? --    No data found.  Updated Vital Signs BP (!) 147/100   Pulse 86   Temp 98.4 F (36.9 C)   Resp 18   SpO2 95%   Visual Acuity Right Eye Distance:   Left Eye Distance:   Bilateral Distance:    Right Eye Near:   Left Eye Near:    Bilateral Near:     Physical Exam  Constitutional: She appears well-developed and well-nourished. No distress.  HENT:  Head: Normocephalic and atraumatic.  Bilateral TMs nonerythematous, nasal mucosa erythematous, turbinates swollen, rhinorrhea present.  Posterior oropharynx erythematous, no tonsillar enlargement or exudate.   Eyes: Conjunctivae are normal.  Neck: Neck supple.  Cardiovascular: Normal rate and regular rhythm.  No murmur heard. Pulmonary/Chest: Effort normal. No respiratory distress. She has wheezes.  Diffuse wheezing and rhonchi, patient is not struggling to breathe, no pursed lips  Abdominal: Soft. There is no tenderness.  Nontender to light and deep palpation  Musculoskeletal: She exhibits no edema.  Neurological: She is alert.  Skin: Skin is warm and dry.  Psychiatric: She has a normal mood and affect.  Nursing note and vitals reviewed.    UC Treatments / Results  Labs (all labs ordered are listed, but only abnormal results are displayed) Labs Reviewed - No data to display  EKG  EKG Interpretation None       Radiology No results found.  Procedures Procedures (including critical care time)  Medications Ordered in UC Medications - No data to display   Initial Impression / Assessment and Plan / UC Course  I have reviewed the triage vital signs and the nursing notes.  Pertinent labs & imaging results that were available during my care of the patient were reviewed by me and considered in my medical decision making  (see chart for details).     Patient appears to have a COPD exacerbation secondary to bowel URI.  Given patient's increased shortness of breath and productive cough will treat with doxycycline, patient has history of QT prolongation avoiding azithromycin and levofloxacin.  Will provide 5 days of prednisone 50 mg.  Tessalon for cough.  Continuing nebs and out albuterol inhaler at home.  Advised her to monitor her oxygen saturation at home and if it desatting to go to emergency room. Discussed strict return precautions. Patient verbalized understanding and is agreeable with plan.   Final Clinical Impressions(s) / UC Diagnoses   Final diagnoses:  COPD exacerbation [facility])    ED Discharge Orders        Ordered    doxycycline (VIBRAMYCIN) 100 MG capsule  2 times daily     08/28/17 1651    predniSONE (DELTASONE) 50 MG tablet  Daily     08/28/17 1651    benzonatate (TESSALON) 200 MG  capsule  3 times daily PRN     08/28/17 1651       Controlled Substance Prescriptions [place] Controlled Substance Registry consulted? Not Applicable   Janith Lima, [place] 08/29/17 1018

## 2017-09-05 ENCOUNTER — Ambulatory Visit: Payer: Self-pay | Attending: Internal Medicine | Admitting: *Deleted

## 2017-09-12 ENCOUNTER — Other Ambulatory Visit: Payer: Self-pay

## 2017-09-12 ENCOUNTER — Ambulatory Visit (HOSPITAL_COMMUNITY)
Admission: EM | Admit: 2017-09-12 | Discharge: 2017-09-12 | Disposition: A | Discharge status: Home / Self Care | Payer: Self-pay | Attending: Emergency Medicine | Admitting: Emergency Medicine

## 2017-09-12 ENCOUNTER — Encounter (HOSPITAL_COMMUNITY): Payer: Self-pay | Admitting: Emergency Medicine

## 2017-09-12 ENCOUNTER — Ambulatory Visit (INDEPENDENT_AMBULATORY_CARE_PROVIDER_SITE_OTHER): Payer: Self-pay

## 2017-09-12 DIAGNOSIS — J441 Chronic obstructive pulmonary disease with (acute) exacerbation: Secondary | ICD-10-CM

## 2017-09-12 DIAGNOSIS — R0602 Shortness of breath: Secondary | ICD-10-CM

## 2017-09-12 DIAGNOSIS — R05 Cough: Secondary | ICD-10-CM

## 2017-09-12 DIAGNOSIS — J019 Acute sinusitis, unspecified: Secondary | ICD-10-CM

## 2017-09-12 DIAGNOSIS — R062 Wheezing: Secondary | ICD-10-CM

## 2017-09-12 MED ORDER — AMOXICILLIN-POT CLAVULANATE 875-125 MG PO TABS
1.0000 | ORAL_TABLET | Freq: Two times a day (BID) | ORAL | 0 refills | Status: DC
Start: 2017-09-12 — End: 2017-09-16

## 2017-09-12 MED ORDER — PREDNISONE 10 MG (21) PO TBPK: ORAL_TABLET | ORAL | 0 refills | Status: DC

## 2017-09-12 MED ORDER — METHYLPREDNISOLONE SODIUM SUCC 125 MG IJ SOLR
INTRAMUSCULAR | Status: AC
Start: 2017-09-12 — End: 2017-09-12
  Filled 2017-09-12: qty 2

## 2017-09-12 MED ORDER — IPRATROPIUM-ALBUTEROL 0.5-2.5 (3) MG/3ML IN SOLN
RESPIRATORY_TRACT | Status: AC
  Filled 2017-09-12: qty 3

## 2017-09-12 MED ORDER — METHYLPREDNISOLONE SODIUM SUCC 125 MG IJ SOLR
125.0000 mg | Freq: Once | INTRAMUSCULAR | Status: AC
  Administered 2017-09-12: 125 mg via INTRAMUSCULAR

## 2017-09-12 MED ORDER — IPRATROPIUM-ALBUTEROL 0.5-2.5 (3) MG/3ML IN SOLN
3.0000 mL | Freq: Once | RESPIRATORY_TRACT | Status: AC
  Administered 2017-09-12: 3 mL via RESPIRATORY_TRACT

## 2017-09-12 MED FILL — BRILINTA 90 MG TABLET: 90 | 8 days supply | Qty: 16 | Fill #8

## 2017-09-12 NOTE — ED Triage Notes (Addendum)
Pt has recurrent issues with URI's and she is here today because she is having issues with her COPD.  She was here two weeks ago and was prescribed doxycyline, tessalon, and prednisone.  Pt states they helped but as soon as she was done with the course she relapsed.  Pt has no insurance and cannot go to her Pulmonologist.  She admits to at least 10 cigarettes a day.  She is NAD at this time.  Respiratory pattern is normal and unlabored.

## 2017-09-12 NOTE — ED Provider Notes (Signed)
HPI  SUBJECTIVE:  [name] [name] is a 62 y.o. female who presents with purulent nasal congestion, sinus pain and pressure, postnasal drip, persistent shortness of breath,  wheezing, nonproductive cough and burning throat/chest pain. Patient was seen here on 2/14 for URI, found to have a COPD exacerbation.  She was treated with doxycycline as she also has a history of QT prolongation.  She was given 50 mg of prednisone for 5 days, Tessalon.  States that she was feeling somewhat better but not completely so.  States that she usually responds to 6 days of prednisone, antibiotics and a "steroid shot.  "She states that she finished the antibiotics 2 days ago and got worse.  Reports chest soreness from all the coughing and states that she cannot sleep at night.  She has been taking partial DuoNeb treatments, using her albuterol rescue inhaler, Flonase with improvement in her symptoms.  She has also tried Tylenol and Aleve.  Symptoms are worse with walking, activity.  She denies change in the amount or color of her sputum, change in the amount of her DOE, fevers.  She has a past medical history of COPD, asthma, she still smokes a pack per day.  Also history of pneumonia, hypothyroidism, GERD.  States that she ran out of her Prilosec but is taking over-the-counter replacement which is "not working as well".  She has coronary artery disease status post stent, and prediabetes.  ZOX:WRUEAVW, Dalbert Batman, MD Pulmonary: Dr. Joya Gaskins.  Past Medical History:  Diagnosis Date  . Anxiety    takes Lexapro daily  . Arthritis    "back, from neck down pass my bra area" (03/25/2017)  . Asthma   . Bartholin gland cyst 08/29/2011  . Bruises easily    pt is on Effient  . Chronic back pain    herniated nucleus pulposus  . Chronic back pain    "neck to bra area; lower back" (03/25/2017)  . COPD (chronic obstructive pulmonary disease) ([place])    early stages  . Coronary artery disease   . Depression    takes Klonopin daily  .  Diabetes mellitus without complication ([place])   . Diverticulosis   . GERD (gastroesophageal reflux disease)    takes Nexium daily  . H/O hiatal hernia   . Heart attack ([place]) 2011  . Hemorrhoids   . Hernia   . Hyperlipidemia    takes Lipitor daily  . Hypertension    takes Losartan daily and Labetalol bid  . Hypothyroidism    takes Synthroid daily  . Insomnia    hydroxyzine prn  . Joint pain   . Pneumonia    "couple times" (03/25/2017)  . Pre-diabetes    "just found out 1 wk ago" (03/25/2017)  . Psoriasis    elbows,knees,back  . Shortness of breath    with exertion  . Slowing of urinary stream   . Stress incontinence     Past Surgical History:  Procedure Laterality Date  . ABDOMINAL EXPLORATION SURGERY  1977  . ABDOMINAL HYSTERECTOMY  1977   "left one of my ovaries"  . BACK SURGERY    . COLONOSCOPY    . CORONARY ANGIOPLASTY WITH STENT PLACEMENT  2011 X2   "regular stents didn't work; had to go back in in ~ 1 month and put in medicated stents"  . DILATION AND CURETTAGE OF UTERUS    . ESOPHAGOGASTRODUODENOSCOPY    . LEFT HEART CATH AND CORONARY ANGIOGRAPHY N/A 03/26/2017   Procedure: LEFT HEART CATH AND CORONARY  ANGIOGRAPHY;  Surgeon: Belva Crome, MD;  Location: [facility];  Service: Cardiovascular;  Laterality: N/A;  . LUMBAR LAMINECTOMY/DECOMPRESSION MICRODISCECTOMY  08/05/2011   Procedure: LUMBAR LAMINECTOMY/DECOMPRESSION MICRODISCECTOMY;  Surgeon: Otilio Connors, MD;  Location: [facility];  Service: Neurosurgery;  Laterality: Right;  Right Lumbar four-five extraforaminal discectomy  . TONSILLECTOMY     as a child    Family History  Problem Relation Age of Onset  . Heart disease Mother   . Hypertension Mother   . Stroke Mother   . Heart disease Father   . Hypertension Father   . Diabetes Father   . Cancer Maternal Aunt        breast  . Thyroid disease Paternal Aunt   . Heart disease Maternal Grandfather   . Anesthesia problems Daughter   . Cancer  Paternal Grandmother        mouth  . Hypotension Neg Hx   . Malignant hyperthermia Neg Hx   . Pseudochol deficiency Neg Hx     Social History   Tobacco Use  . Smoking status: Current Every Day Smoker    Packs/day: 0.50    Years: 49.00    Pack years: 24.50    Types: Cigarettes  . Smokeless tobacco: Never Used  Substance Use Topics  . Alcohol use: Yes    Comment: 03/25/2017 "glass of wine once in a blue moon; maybe q 5-6 months"  . Drug use: No    Comment: 03/25/2017 "used cocaine a couple days ago; smoke pot from time to time"    No current facility-administered medications for this encounter.   Current Outpatient Medications:  .  albuterol (VENTOLIN HFA) 108 (90 Base) MCG/ACT inhaler, Inhale 2 puffs every 6 (six) hours as needed into the lungs for wheezing or shortness of breath., Disp: 18 g, Rfl: 2 .  ipratropium-albuterol (DUONEB) 0.5-2.5 (3) MG/3ML SOLN, Take 3 mLs every 6 (six) hours as needed by nebulization. (Patient taking differently: Take 3 mLs by nebulization every 6 (six) hours as needed (wheezing). ), Disp: 360 mL, Rfl: 3 .  amoxicillin-clavulanate (AUGMENTIN) 875-125 MG tablet, Take 1 tablet by mouth 2 (two) times daily. X 7 days, Disp: 14 tablet, Rfl: 0 .  aspirin EC 81 MG tablet, Take 81 mg by mouth daily., Disp: , Rfl:  .  atorvastatin (LIPITOR) 40 MG tablet, Take 1 tablet (40 mg total) daily by mouth. (Patient taking differently: Take 40 mg by mouth at bedtime. ), Disp: 90 tablet, Rfl: 3 .  Blood Glucose Monitoring Suppl (TRUE METRIX METER) DEVI, 1 kit by Does not apply route 4 (four) times daily., Disp: 1 Device, Rfl: 0 .  Calcium 600 MG tablet, Take 600 mg by mouth daily. , Disp: 60 tablet, Rfl:  .  cyclobenzaprine (FLEXERIL) 10 MG tablet, Take 1 tablet (10 mg total) by mouth 3 (three) times daily as needed. (Patient taking differently: Take 10 mg by mouth 3 (three) times daily as needed for muscle spasms. ), Disp: 30 tablet, Rfl: 2 .  escitalopram (LEXAPRO) 10 MG  tablet, Take 1 tablet (10 mg total) daily by mouth., Disp: 90 tablet, Rfl: 3 .  fluticasone furoate-vilanterol (BREO ELLIPTA) 200-25 MCG/INH AEPB, Inhale 1 puff daily into the lungs., Disp: 60 each, Rfl: 3 .  furosemide (LASIX) 20 MG tablet, Take 1 tablet (20 mg total) by mouth daily., Disp: 30 tablet, Rfl: 3 .  gabapentin (NEURONTIN) 600 MG tablet, TAKE 1 TABLET BY MOUTH 4 TIMES DAILY, Disp: 120 tablet, Rfl:  3 .  glucose blood (TRUE METRIX BLOOD GLUCOSE TEST) test strip, Use as instructed, Disp: 100 each, Rfl: 12 .  hydrOXYzine (VISTARIL) 25 MG capsule, TAKE 25MG BY MOUTH THREE TIMES DAILY (Patient taking differently: Take 25 mg by mouth 3 (three) times daily. ), Disp: 90 capsule, Rfl: 3 .  labetalol (NORMODYNE) 100 MG tablet, Take 1 tablet (100 mg total) 2 (two) times daily by mouth., Disp: 60 tablet, Rfl: 3 .  levothyroxine (SYNTHROID, LEVOTHROID) 100 MCG tablet, Take 1 tablet (100 mcg total) daily by mouth., Disp: 30 tablet, Rfl: 6 .  losartan (COZAAR) 50 MG tablet, Take 1.5 tablets (75 mg total) by mouth daily., Disp: 45 tablet, Rfl: 6 .  Melatonin 10 MG SUBL, Place 20 mg under the tongue at bedtime., Disp: , Rfl:  .  montelukast (SINGULAIR) 10 MG tablet, Take 1 tablet (10 mg total) at bedtime by mouth., Disp: 30 tablet, Rfl: 3 .  nicotine (NICODERM CQ - DOSED IN MG/24 HOURS) 21 mg/24hr patch, Place 1 patch (21 mg total) onto the skin daily., Disp: 28 patch, Rfl: 0 .  omeprazole (PRILOSEC) 40 MG capsule, Take 1 capsule (40 mg total) daily by mouth., Disp: 30 capsule, Rfl: 3 .  ondansetron (ZOFRAN) 4 MG tablet, Take 1 tablet (4 mg total) every 8 (eight) hours as needed by mouth for nausea or vomiting., Disp: 20 tablet, Rfl: 0 .  predniSONE (STERAPRED UNI-PAK 21 TAB) 10 MG (21) TBPK tablet, Dispense one 6 day pack. Take as directed with food., Disp: 21 tablet, Rfl: 0 .  ticagrelor (BRILINTA) 90 MG TABS tablet, Take 1 tablet (90 mg total) 2 (two) times daily by mouth., Disp: 60 tablet, Rfl: 6 .   traMADol (ULTRAM) 50 MG tablet, Take 1 tablet (50 mg total) every 12 (twelve) hours as needed by mouth. (Patient taking differently: Take 50 mg by mouth every 12 (twelve) hours as needed for moderate pain. ), Disp: 60 tablet, Rfl: 1 .  triamcinolone cream (KENALOG) 0.1 %, Apply 1 application topically 2 (two) times daily., Disp: 30 g, Rfl: 0 .  TRUEPLUS LANCETS 28G MISC, 28 g by Does not apply route QID., Disp: 120 each, Rfl: 2  Allergies  Allergen Reactions  . Other Anaphylaxis    avocados  . Codeine Nausea Only  . Latex Rash    REACTION: shortness of breath and rash     ROS  As noted in HPI.   Physical Exam  BP (!) 106/59 (BP Location: Left Arm)   Pulse 75   Temp 99.2 F (37.3 C) (Oral)   SpO2 96%   Constitutional: Well developed, well nourished, no acute distress Eyes:  EOMI, conjunctiva normal bilaterally HENT: Normocephalic, atraumatic,mucus membranes moist.  Mucoid nasal congestion.  Normal turbinates.  Positive maxillary and frontal sinus tenderness. Respiratory: Normal inspiratory effort, speaking in full sentences.  Prolonged expiratory phase, wheezing throughout.  Diffuse chest wall tenderness Cardiovascular: Normal rate given rhythm, no murmurs, rubs, gallops GI: nondistended skin: No rash, skin intact Musculoskeletal: no deformities, no lower extremity edema, calves symmetric, nontender Neurologic: Alert & oriented x 3, no focal neuro deficits Psychiatric: Speech and behavior appropriate   ED Course   Medications  ipratropium-albuterol (DUONEB) 0.5-2.5 (3) MG/3ML nebulizer solution 3 mL (3 mLs Nebulization Given 09/12/17 1400)  methylPREDNISolone sodium succinate (SOLU-MEDROL) 125 mg/2 mL injection 125 mg (125 mg Intramuscular Given 09/12/17 1400)    Orders Placed This Encounter  Procedures  . DG Chest 2 View    Standing Status:  Standing    Number of Occurrences:   1    Order Specific Question:   Reason for Exam (SYMPTOM  OR DIAGNOSIS REQUIRED)    Answer:    r/p PNA, pulm edema, effusion    No results found for this or any previous visit (from the past 24 hour(s)). Dg Chest 2 View  Result Date: 09/12/2017 CLINICAL DATA:  Cough and wheezing EXAM: CHEST  2 VIEW COMPARISON:  03/25/2017 FINDINGS: Hyperinflation with diffuse bronchitic changes. Slight increased interstitial opacity. No focal consolidation or effusion. Normal heart size. Aortic atherosclerosis. No pneumothorax. IMPRESSION: Hyperinflation with bronchitic changes. Mild increased interstitial opacity suggests acute interstitial inflammation on underlying chronic change. There is no focal pulmonary infiltrate. Electronically Signed   By: Donavan Foil M.D.   On: 09/12/2017 14:00    ED Clinical Impression  COPD exacerbation [facility])   ED Assessment/Plan  Previous charts reviewed as noted in HPI.  Patient with a persistent COPD exacerbation.  She also has a sinusitis.  Will check a chest x-ray today because she did not get better with the doxycycline to rule out a pneumonia.  Also giving Solu-Medrol 125 mg IM x1 here and a DuoNeb.  Plan to send home with Augmentin x 7 days because of the recent doxycycline and because of the history of QT prolongation, this will also cover sinusitis.  She states that she has plenty of bronchodilators left.  She also has a spacer at home.  Will send home with a 6-day prednisone taper in addition to the Augmentin as she states that she normally requires 6 days of steroids.  She will need to follow-up with Dr. Joya Gaskins, her pulmonologist ASAP, she will go to the ER for desaturations below 90%, if she is not responding to the bronchodilators, she gets worse, fevers above 100.4 or for any concerns.  Reviewed imaging independently.  Hyperinflation with bronchitic changes.  No focal pulmonary infiltrate.  Mild increased interstitial opacity consistent with interstitial inflammation on underlying chronic change.  See radiology report for full details.  Reevaluation,  patient states that she feels much better.  She has no wheezing on repeat exam.  Plan as above.  Discussed imaging, MDM, plan and followup with patient. Discussed sn/sx that should prompt return to the ED. patient agrees with plan.   Meds ordered this encounter  Medications  . ipratropium-albuterol (DUONEB) 0.5-2.5 (3) MG/3ML nebulizer solution 3 mL  . methylPREDNISolone sodium succinate (SOLU-MEDROL) 125 mg/2 mL injection 125 mg  . amoxicillin-clavulanate (AUGMENTIN) 875-125 MG tablet    Sig: Take 1 tablet by mouth 2 (two) times daily. X 7 days    Dispense:  14 tablet    Refill:  0  . predniSONE (STERAPRED UNI-PAK 21 TAB) 10 MG (21) TBPK tablet    Sig: Dispense one 6 day pack. Take as directed with food.    Dispense:  21 tablet    Refill:  0    *This clinic note was created using [employer]. Therefore, there may be occasional mistakes despite careful proofreading.   ?   Melynda Ripple, MD 09/12/17 1800

## 2017-09-12 NOTE — Discharge Instructions (Signed)
Finish the Augmentin and the prednisone.  Use the DuoNeb every 6 hours.  Finish the duo nebs when you take them.  Use the albuterol with a spacer in between.  Go to the ER if you are needing your albuterol inhaler every hour, if you start having fevers above 100.4, for the signs and symptoms we discussed, or other concerns.

## 2017-09-15 ENCOUNTER — Emergency Department (HOSPITAL_COMMUNITY): Payer: Self-pay

## 2017-09-15 ENCOUNTER — Inpatient Hospital Stay (HOSPITAL_COMMUNITY)
Admission: EM | Admit: 2017-09-15 | Discharge: 2017-09-16 | DRG: 192 | Disposition: A | Discharge status: Home / Self Care | Payer: Self-pay | Attending: Internal Medicine | Admitting: Internal Medicine

## 2017-09-15 ENCOUNTER — Encounter (HOSPITAL_COMMUNITY): Payer: Self-pay | Admitting: Emergency Medicine

## 2017-09-15 DIAGNOSIS — F329 Major depressive disorder, single episode, unspecified: Secondary | ICD-10-CM | POA: Diagnosis present

## 2017-09-15 DIAGNOSIS — I252 Old myocardial infarction: Secondary | ICD-10-CM

## 2017-09-15 DIAGNOSIS — F419 Anxiety disorder, unspecified: Secondary | ICD-10-CM | POA: Diagnosis present

## 2017-09-15 DIAGNOSIS — F1490 Cocaine use, unspecified, uncomplicated: Secondary | ICD-10-CM

## 2017-09-15 DIAGNOSIS — E039 Hypothyroidism, unspecified: Secondary | ICD-10-CM | POA: Diagnosis present

## 2017-09-15 DIAGNOSIS — F17210 Nicotine dependence, cigarettes, uncomplicated: Secondary | ICD-10-CM | POA: Diagnosis present

## 2017-09-15 DIAGNOSIS — J9602 Acute respiratory failure with hypercapnia: Secondary | ICD-10-CM

## 2017-09-15 DIAGNOSIS — I2510 Atherosclerotic heart disease of native coronary artery without angina pectoris: Secondary | ICD-10-CM | POA: Diagnosis present

## 2017-09-15 DIAGNOSIS — J9601 Acute respiratory failure with hypoxia: Secondary | ICD-10-CM

## 2017-09-15 DIAGNOSIS — E785 Hyperlipidemia, unspecified: Secondary | ICD-10-CM | POA: Diagnosis present

## 2017-09-15 DIAGNOSIS — J441 Chronic obstructive pulmonary disease with (acute) exacerbation: Principal | ICD-10-CM | POA: Diagnosis present

## 2017-09-15 DIAGNOSIS — G4700 Insomnia, unspecified: Secondary | ICD-10-CM | POA: Diagnosis present

## 2017-09-15 DIAGNOSIS — Z955 Presence of coronary angioplasty implant and graft: Secondary | ICD-10-CM

## 2017-09-15 DIAGNOSIS — Z8249 Family history of ischemic heart disease and other diseases of the circulatory system: Secondary | ICD-10-CM

## 2017-09-15 DIAGNOSIS — E119 Type 2 diabetes mellitus without complications: Secondary | ICD-10-CM | POA: Diagnosis present

## 2017-09-15 DIAGNOSIS — K219 Gastro-esophageal reflux disease without esophagitis: Secondary | ICD-10-CM | POA: Diagnosis present

## 2017-09-15 DIAGNOSIS — I10 Essential (primary) hypertension: Secondary | ICD-10-CM | POA: Diagnosis present

## 2017-09-15 DIAGNOSIS — Z7902 Long term (current) use of antithrombotics/antiplatelets: Secondary | ICD-10-CM

## 2017-09-15 DIAGNOSIS — F339 Major depressive disorder, recurrent, unspecified: Secondary | ICD-10-CM

## 2017-09-15 LAB — I-STAT ARTERIAL BLOOD GAS, ED
ACID-BASE EXCESS: 3 mmol/L — AB (ref 0.0–2.0)
Bicarbonate: 29 mmol/L — ABNORMAL HIGH (ref 20.0–28.0)
O2 Saturation: 95 %
PCO2 ART: 50.8 mmHg — AB (ref 32.0–48.0)
PH ART: 7.365 (ref 7.350–7.450)
Patient temperature: 98.6
TCO2: 31 mmol/L (ref 22–32)
pO2, Arterial: 80 mmHg — ABNORMAL LOW (ref 83.0–108.0)

## 2017-09-15 LAB — CBC WITH DIFFERENTIAL/PLATELET
BASOS PCT: 0 %
Basophils Absolute: 0 10*3/uL (ref 0.0–0.1)
EOS ABS: 0 10*3/uL (ref 0.0–0.7)
EOS PCT: 0 %
HCT: 42.1 % (ref 36.0–46.0)
Hemoglobin: 14 g/dL (ref 12.0–15.0)
LYMPHS ABS: 1.1 10*3/uL (ref 0.7–4.0)
Lymphocytes Relative: 10 %
MCH: 29.8 pg (ref 26.0–34.0)
MCHC: 33.3 g/dL (ref 30.0–36.0)
MCV: 89.6 fL (ref 78.0–100.0)
MONOS PCT: 16 %
Monocytes Absolute: 1.7 10*3/uL — ABNORMAL HIGH (ref 0.1–1.0)
NEUTROS PCT: 74 %
Neutro Abs: 7.8 10*3/uL — ABNORMAL HIGH (ref 1.7–7.7)
PLATELETS: 401 10*3/uL — AB (ref 150–400)
RBC: 4.7 MIL/uL (ref 3.87–5.11)
RDW: 15.2 % (ref 11.5–15.5)
WBC: 10.6 10*3/uL — ABNORMAL HIGH (ref 4.0–10.5)

## 2017-09-15 LAB — COMPREHENSIVE METABOLIC PANEL
ALBUMIN: 3.7 g/dL (ref 3.5–5.0)
ALT: 28 U/L (ref 14–54)
ANION GAP: 13 (ref 5–15)
AST: 39 U/L (ref 15–41)
Alkaline Phosphatase: 83 U/L (ref 38–126)
BUN: 15 mg/dL (ref 6–20)
CALCIUM: 9.1 mg/dL (ref 8.9–10.3)
CHLORIDE: 99 mmol/L — AB (ref 101–111)
CO2: 26 mmol/L (ref 22–32)
Creatinine, Ser: 0.82 mg/dL (ref 0.44–1.00)
GFR calc non Af Amer: >60 mL/min (ref >60)
Glucose, Bld: 121 mg/dL — ABNORMAL HIGH (ref 65–99)
POTASSIUM: 4.7 mmol/L (ref 3.5–5.1)
SODIUM: 138 mmol/L (ref 135–145)
Total Bilirubin: 0.8 mg/dL (ref 0.3–1.2)
Total Protein: 6.3 g/dL — ABNORMAL LOW (ref 6.5–8.1)

## 2017-09-15 LAB — TROPONIN I: Troponin I: <0.03 ng/mL (ref <0.03)

## 2017-09-15 MED ORDER — SODIUM CHLORIDE 0.9% FLUSH
3.0000 mL | Freq: Two times a day (BID) | INTRAVENOUS | Status: DC
  Administered 2017-09-15 – 2017-09-16 (×2): 3 mL via INTRAVENOUS

## 2017-09-15 MED ORDER — ATORVASTATIN CALCIUM 40 MG PO TABS
40.0000 mg | ORAL_TABLET | Freq: Every day | ORAL | Status: DC
  Filled 2017-09-15: qty 1

## 2017-09-15 MED ORDER — GABAPENTIN 600 MG PO TABS
600.0000 mg | ORAL_TABLET | Freq: Four times a day (QID) | ORAL | Status: DC
  Filled 2017-09-15 (×3): qty 1

## 2017-09-15 MED ORDER — IPRATROPIUM-ALBUTEROL 0.5-2.5 (3) MG/3ML IN SOLN
3.0000 mL | Freq: Four times a day (QID) | RESPIRATORY_TRACT | Status: DC
  Administered 2017-09-15 – 2017-09-16 (×4): 3 mL via RESPIRATORY_TRACT
  Filled 2017-09-15 (×5): qty 3

## 2017-09-15 MED ORDER — ACETAMINOPHEN 325 MG PO TABS: 650.0000 mg | ORAL_TABLET | Freq: Four times a day (QID) | ORAL | Status: DC | PRN

## 2017-09-15 MED ORDER — SODIUM CHLORIDE 0.9 % IV SOLN
500.0000 mg | Freq: Once | INTRAVENOUS | Status: AC
  Administered 2017-09-15: 500 mg via INTRAVENOUS
  Filled 2017-09-15: qty 500

## 2017-09-15 MED ORDER — PREDNISONE 20 MG PO TABS
40.0000 mg | ORAL_TABLET | Freq: Every day | ORAL | Status: DC
  Administered 2017-09-16: 40 mg via ORAL
  Filled 2017-09-15: qty 2

## 2017-09-15 MED ORDER — FUROSEMIDE 20 MG PO TABS
20.0000 mg | ORAL_TABLET | Freq: Every day | ORAL | Status: DC
  Administered 2017-09-16: 20 mg via ORAL
  Filled 2017-09-15: qty 1

## 2017-09-15 MED ORDER — IPRATROPIUM BROMIDE 0.02 % IN SOLN
0.5000 mg | Freq: Once | RESPIRATORY_TRACT | Status: AC
  Administered 2017-09-15: 0.5 mg via RESPIRATORY_TRACT

## 2017-09-15 MED ORDER — ENOXAPARIN SODIUM 40 MG/0.4ML ~~LOC~~ SOLN
40.0000 mg | SUBCUTANEOUS | Status: DC
  Administered 2017-09-15: 40 mg via SUBCUTANEOUS
  Filled 2017-09-15: qty 0.4

## 2017-09-15 MED ORDER — MAGNESIUM SULFATE 2 GM/50ML IV SOLN
2.0000 g | Freq: Once | INTRAVENOUS | Status: AC
  Administered 2017-09-15: 2 g via INTRAVENOUS
  Filled 2017-09-15: qty 50

## 2017-09-15 MED ORDER — TICAGRELOR 90 MG PO TABS
90.0000 mg | ORAL_TABLET | Freq: Two times a day (BID) | ORAL | Status: DC
  Administered 2017-09-16: 90 mg via ORAL
  Filled 2017-09-15: qty 1

## 2017-09-15 MED ORDER — ALBUTEROL (5 MG/ML) CONTINUOUS INHALATION SOLN
10.0000 mg/h | INHALATION_SOLUTION | Freq: Once | RESPIRATORY_TRACT | Status: AC
  Administered 2017-09-15: 10 mg/h via RESPIRATORY_TRACT

## 2017-09-15 MED ORDER — MONTELUKAST SODIUM 10 MG PO TABS
10.0000 mg | ORAL_TABLET | Freq: Every day | ORAL | Status: DC
  Filled 2017-09-15: qty 1

## 2017-09-15 MED ORDER — NICOTINE 21 MG/24HR TD PT24
21.0000 mg | MEDICATED_PATCH | Freq: Every day | TRANSDERMAL | Status: DC
  Administered 2017-09-16: 21 mg via TRANSDERMAL
  Filled 2017-09-15: qty 1

## 2017-09-15 MED ORDER — IPRATROPIUM BROMIDE 0.02 % IN SOLN
RESPIRATORY_TRACT | Status: AC
  Filled 2017-09-15: qty 2.5

## 2017-09-15 MED ORDER — FLUTICASONE FUROATE-VILANTEROL 200-25 MCG/INH IN AEPB
1.0000 | INHALATION_SPRAY | Freq: Every day | RESPIRATORY_TRACT | Status: DC
  Administered 2017-09-16: 1 via RESPIRATORY_TRACT
  Filled 2017-09-15 (×2): qty 28

## 2017-09-15 MED ORDER — LOSARTAN POTASSIUM 25 MG PO TABS
75.0000 mg | ORAL_TABLET | Freq: Every day | ORAL | Status: DC
  Filled 2017-09-15: qty 1

## 2017-09-15 MED ORDER — LABETALOL HCL 100 MG PO TABS
100.0000 mg | ORAL_TABLET | Freq: Two times a day (BID) | ORAL | Status: DC
  Administered 2017-09-16: 100 mg via ORAL
  Filled 2017-09-15: qty 1

## 2017-09-15 MED ORDER — ACETAMINOPHEN 650 MG RE SUPP: 650.0000 mg | Freq: Four times a day (QID) | RECTAL | Status: DC | PRN

## 2017-09-15 MED ORDER — SODIUM CHLORIDE 0.9 % IV SOLN: 250.0000 mL | INTRAVENOUS | Status: DC | PRN

## 2017-09-15 MED ORDER — ALBUTEROL (5 MG/ML) CONTINUOUS INHALATION SOLN
INHALATION_SOLUTION | RESPIRATORY_TRACT | Status: AC
  Filled 2017-09-15: qty 20

## 2017-09-15 MED ORDER — LEVOTHYROXINE SODIUM 100 MCG PO TABS
100.0000 ug | ORAL_TABLET | Freq: Every day | ORAL | Status: DC
  Administered 2017-09-16: 100 ug via ORAL
  Filled 2017-09-15: qty 1

## 2017-09-15 MED ORDER — ESCITALOPRAM OXALATE 10 MG PO TABS
10.0000 mg | ORAL_TABLET | Freq: Every day | ORAL | Status: DC
  Administered 2017-09-16: 10 mg via ORAL
  Filled 2017-09-15: qty 1

## 2017-09-15 MED ORDER — DEXTROSE 5 % IV SOLN: 250.0000 mg | INTRAVENOUS | Status: DC

## 2017-09-15 MED ORDER — SODIUM CHLORIDE 0.9% FLUSH: 3.0000 mL | INTRAVENOUS | Status: DC | PRN

## 2017-09-15 NOTE — ED Triage Notes (Signed)
Per EMS pt from home, SOB started on prednisone, SOB got worse today, 2nd neb treatment, tri-poding at house. 125 mg solumedrol, non-productive cough

## 2017-09-15 NOTE — H&P (Signed)
Date: 09/15/2017               Patient Name:  [name] [name] MRN: [id number]  DOB: 21-Nov-1955 Age / Sex: 62 y.o., female   PCP: Ladell Pier, MD         Medical Service: Internal Medicine Teaching Service         Attending Physician: Dr. Lucious Groves, DO    First Contact: Dr. Thomasene Ripple Pager: [phone]  Second Contact: Dr. Alphonzo Grieve Pager: [phone]       After Hours (After 5p/  First Contact Pager: [phone]  weekends / holidays): Second Contact Pager: [phone]   Chief Complaint: Shortness of breath  History of Present Illness: [name] [name] is a 62 yo with a PMH of anxiety/depression, COPD, CAD s/p multiple DES placement, HTN, and hypothyroidism who presents with a 4 week history of progressively worsening shortness of breath and associated chest pain. History obtained directly from the patient but limited as patient was on BiPAP during interview. Patient states that for the past month she has noticed increased shortness of breath and chest tightness. Along with these symptoms she notes increased sinus drainage and congestion. She denies productive cough with sputum. Also denies fevers, chills, headaches, nausea, vomiting, and leg/abdominal swelling. She states that her breathing has become so difficult that she can no longer complete activities of daily living. She has sought out care twice since the constellation of symptoms began. At first she was given doxycycline and prednisone. She took these medications for 5 days with little improvement. She then was given amoxicillin and prednisone taper (7 days) about 2 days ago. These medications have not helped, so she sought further care in the ED today.  Leading up to admission the patient states that she has had to use her albuterol and BREO inhaler at home daily to help with her symptoms. She does not normally use this medication every day 2/2 cost but her symptoms were so severe that she had to use it all the time prior to  admission. Patient also notes symptoms were so severe that she couldn't smoke her usual cigarettes today. Patient notes that she tried to smoke marijuana last night prior to admission but that was difficult for her to do given her SOB. She was able to use cocaine last night without any difficulties in her breathing/chest tightness.   Upon arrival to the ED the patient was afebrile, non-tachycardic, hypertensive to 160s/80s, and tachypneic. She was saturating 100% on room air. Her CMP was significant for bicarb = 26 and Cl = 99. Her WBC was elevated to 10.6. ABG resulted pH of 7.365, pCO2 = 50.8, pO2 = 80.0, and bicarbonate of 29.0. Chest X-Ray showed mild hyperinflation but no evidence of acute infiltration.   Meds:  No outpatient medications have been marked as taking for the 09/15/17 encounter [facility] Encounter).   Allergies: Allergies as of 09/15/2017 - Review Complete 09/15/2017  Allergen Reaction Noted  . 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 Latex Shortness Of Breath and Rash 12/22/2008  . Codeine Nausea Only 12/22/2008   Past Medical History: Past Medical History:  Diagnosis Date  . Anxiety    takes Lexapro daily  . Arthritis    "back, from neck down pass my bra area" (03/25/2017)  . Asthma   . Bartholin gland cyst 08/29/2011  . Bruises easily    pt is on Effient  . Chronic back pain    herniated nucleus pulposus  . Chronic  back pain    "neck to bra area; lower back" (03/25/2017)  . COPD (chronic obstructive pulmonary disease) ([place])    early stages  . Coronary artery disease   . Depression    takes Klonopin daily  . Diabetes mellitus without complication ([place])   . Diverticulosis   . GERD (gastroesophageal reflux disease)    takes Nexium daily  . H/O hiatal hernia   . Heart attack ([place]) 2011  . Hemorrhoids   . Hernia   . Hyperlipidemia    takes Lipitor daily  . Hypertension    takes Losartan daily and Labetalol bid  . Hypothyroidism    takes Synthroid daily  . Insomnia     hydroxyzine prn  . Joint pain   . Pneumonia    "couple times" (03/25/2017)  . Pre-diabetes    "just found out 1 wk ago" (03/25/2017)  . Psoriasis    elbows,knees,back  . Shortness of breath    with exertion  . Slowing of urinary stream   . Stress incontinence    Family History:  Family History  Problem Relation Age of Onset  . Heart disease Mother   . Hypertension Mother   . Stroke Mother   . Heart disease Father   . Hypertension Father   . Diabetes Father   . Cancer Maternal Aunt        breast  . Thyroid disease Paternal Aunt   . Heart disease Maternal Grandfather   . Anesthesia problems Daughter   . Cancer Paternal Grandmother        mouth  . Hypotension Neg Hx   . Malignant hyperthermia Neg Hx   . Pseudochol deficiency Neg Hx    Social History:  Patient lives with mother. Current 1/2 PPD smoker. Used to smoke 1-2 PPD. Smoked for  A total of 50 years (since age 38). Smoked marijuana for the first time yesterday. Used cocaine last night. Social drinker.  Review of Systems: A complete ROS was negative except as per HPI.   Physical Exam: Blood pressure (!) 164/86, pulse 96, temperature 98.5 F (36.9 C), temperature source Oral, resp. rate (!) 22, height 5\' 1"  (1.549 m), weight 126 lb (57.2 kg), SpO2 94 %.  Physical Exam  Constitutional:  Appears older than stated age sitting comfortably in bed wearing BiPAP in no acute distress.  Eyes: Right eye exhibits no discharge. Left eye exhibits no discharge. No scleral icterus.  Cardiovascular: Normal rate, regular rhythm and intact distal pulses. Exam reveals no friction rub.  No murmur heard. Respiratory:  Patient wearing BiPAP. Appears to be breathing comfortably. No nasal flaring or accessory muscle use. Decreased air movement in lung bases and in mid-lung fields. Good aeration in apical fields. Minimal end expiratory wheezing throughout.  GI: Soft. Bowel sounds are normal. She exhibits no distension. There is no  tenderness. There is no rebound.  Musculoskeletal: She exhibits no edema (of bilateral lower extremities) or tenderness (of bilateral lower extremities).  Skin: Skin is warm and dry. No rash noted. No erythema.   EKG: personally reviewed my interpretation is normal sinus rhythm without signs of ST elevation. EKG reading limited 2/2 patient movement with exam and artifacts in all leads (predominantly precordial leads).  CXR: personally reviewed my interpretation is mild hyperinflation without signs of vascular congestion or acute opacity to suggest pneumonia. No pleural effusions.   Assessment & Plan by Problem: Active Problems:   COPD with acute exacerbation ([place])  [name] [name] is a 62 yo with a  PMH of anxiety/depression, COPD, CAD s/p multiple DES placement, HTN, and hypothyroidism who presents with a 4 week history of progressively worsening shortness of breath and associated chest pain. Patient found to have increased work of breathing upon arrival to ED, increased pCO2, and was started on BiPAP. She was admitted to the internal medicine teaching service for management. The specific problems addressed during admission are as follows:  COPD exacerbation: Per chart review patient has multiple visits over the past few months for sinus drainage, cough, shortness of breath. She has been previously treated with doxycycline and prednisone 50 mg x5 days in early February 2019 and Augmentin and prednisone taper x7 days on March 07/2017. Neither of the regimens have reportedly worked for the patient, however medication compliance is an issue given that the patient admitted to not always taking her medications as prescribed at home. Patient's recent episode may have also been acutely worsened by use of marijuana and/or cocaine on the evening prior to presentation. Patient's physical exam has diffuse decreased air movement throughout with end expiratory wheezing, consistent with COPD exacerbation. Will plan to  treat for this with oral antibiotics, steroids, and scheduled breathing treatments. Will also reassess in AM to see if sufficient clinical improvement to transition from IV to oral steroids.  -Oral azithromycin for 5 days -IV solumedrol -DuoNebs q6 hours scheduled -Continue BiPAP PRN for increased WOB, wean as tolerated  CAD s/p stent placement: Patient states that she has been out of her medications for quite sometime, but knows that she takes some of them on a regular basis. Patient supposed on Brilinta 90 mg BID (for hx of multiple stent placement and hx of stent failure?) and will provide this while inpatient.  -Continue home atorvastatin 40 mg, Brilinta 90 mg BID  HTN: Patient's BP elevated in ED -Resume home losartan 75 mg and labetalol 100 mg BID in AM (once off BiPAP)  FEN/GI: -NPO while on BiPAP, heart healthy thereafter -No IVF, replace electrolytes as needed  VTE Prophylaxis: Lovenox daily Code Status: Full  Dispo: Admit patient to Inpatient with expected length of stay greater than 2 midnights.  SignedThomasene Ripple, MD 09/15/2017, 7:08 PM  Pager: [phone]

## 2017-09-15 NOTE — ED Provider Notes (Signed)
[facility] EMERGENCY DEPARTMENT Provider Note   CSN: [id number] Arrival date & time: 09/15/17  1538     History   Chief Complaint Chief Complaint  Patient presents with  . Shortness of Breath    HPI [name] [name] is a 62 y.o. female.  HPI Patient presents with shortness of breath.  Has had for the last couple days and was started on prednisone by her PCP.  Worse today.  Nebulizer treatment by EMS.  Also got Solu-Medrol.  Occasional cough without much production.  Slight dull chest pain.  Has been coughing.  Has to come to the ER about every couple months for COPD.  States she has never been intubated for it and rarely has to be admitted to the hospital.  She is a current smoker.  Dr. Joya Gaskins is her pulmonologist. Past Medical History:  Diagnosis Date  . Anxiety    takes Lexapro daily  . Arthritis    "back, from neck down pass my bra area" (03/25/2017)  . Asthma   . Bartholin gland cyst 08/29/2011  . Bruises easily    pt is on Effient  . Chronic back pain    herniated nucleus pulposus  . Chronic back pain    "neck to bra area; lower back" (03/25/2017)  . COPD (chronic obstructive pulmonary disease) ([place])    early stages  . Coronary artery disease   . Depression    takes Klonopin daily  . Diabetes mellitus without complication ([place])   . Diverticulosis   . GERD (gastroesophageal reflux disease)    takes Nexium daily  . H/O hiatal hernia   . Heart attack ([place]) 2011  . Hemorrhoids   . Hernia   . Hyperlipidemia    takes Lipitor daily  . Hypertension    takes Losartan daily and Labetalol bid  . Hypothyroidism    takes Synthroid daily  . Insomnia    hydroxyzine prn  . Joint pain   . Pneumonia    "couple times" (03/25/2017)  . Pre-diabetes    "just found out 1 wk ago" (03/25/2017)  . Psoriasis    elbows,knees,back  . Shortness of breath    with exertion  . Slowing of urinary stream   . Stress incontinence     Patient Active Problem List   Diagnosis Date Noted  . Pre-diabetes 05/28/2017  . Emesis, persistent 05/28/2017  . QT prolongation 03/25/2017  . Encounter for screening mammogram for breast cancer 09/11/2015  . Psoriasis 06/22/2015  . COPD (chronic obstructive pulmonary disease) ([place]) 05/18/2015  . Anxiety and depression 07/10/2013  . Essential hypertension, benign 06/07/2013  . Dyslipidemia 06/07/2013  . Asthma, chronic 06/07/2013  . Emphysema lung ([place]) 06/07/2013  . Chronic back pain 06/07/2013  . CAD S/P percutaneous coronary angioplasty 06/07/2013  . GERD (gastroesophageal reflux disease) 06/07/2013  . Breast lump on left side at 3 o'clock position 10/13/2012  . S/P abdominal hysterectomy and right salpingo-oophorectomy 08/29/2011  . COPD exacerbation ([place]) 09/15/2009  . TOBACCO ABUSE 07/17/2009  . Chronic rhinitis 07/17/2009  . Lung nodule < 6cm on CT 07/17/2009  . ALLERGY, FOOD 07/17/2009  . Hypothyroidism 12/22/2008    Past Surgical History:  Procedure Laterality Date  . ABDOMINAL EXPLORATION SURGERY  1977  . ABDOMINAL HYSTERECTOMY  1977   "left one of my ovaries"  . BACK SURGERY    . COLONOSCOPY    . CORONARY ANGIOPLASTY WITH STENT PLACEMENT  2011 X2   "regular stents didn't work; had to go  back in in ~ 1 month and put in medicated stents"  . DILATION AND CURETTAGE OF UTERUS    . ESOPHAGOGASTRODUODENOSCOPY    . LEFT HEART CATH AND CORONARY ANGIOGRAPHY N/A 03/26/2017   Procedure: LEFT HEART CATH AND CORONARY ANGIOGRAPHY;  Surgeon: Belva Crome, MD;  Location: [facility];  Service: Cardiovascular;  Laterality: N/A;  . LUMBAR LAMINECTOMY/DECOMPRESSION MICRODISCECTOMY  08/05/2011   Procedure: LUMBAR LAMINECTOMY/DECOMPRESSION MICRODISCECTOMY;  Surgeon: Otilio Connors, MD;  Location: [facility];  Service: Neurosurgery;  Laterality: Right;  Right Lumbar four-five extraforaminal discectomy  . TONSILLECTOMY     as a child    OB History    Gravida Para Term Preterm AB Living   _0 0 0 1    SAB TAB Ectopic Multiple Live Births   0 0 0 0         Home Medications    Prior to Admission medications   Medication Sig Start Date End Date Taking? Authorizing Provider  albuterol (VENTOLIN HFA) 108 (90 Base) MCG/ACT inhaler Inhale 2 puffs every 6 (six) hours as needed into the lungs for wheezing or shortness of breath. 05/26/17   Tresa Garter, MD  amoxicillin-clavulanate (AUGMENTIN) 875-125 MG tablet Take 1 tablet by mouth 2 (two) times daily. X 7 days 09/12/17   Melynda Ripple, MD  aspirin EC 81 MG tablet Take 81 mg by mouth daily.    [provider]  atorvastatin (LIPITOR) 40 MG tablet Take 1 tablet (40 mg total) daily by mouth. Patient taking differently: Take 40 mg by mouth at bedtime.  05/28/17 11/24/17  Elsie Stain, MD  Blood Glucose Monitoring Suppl (TRUE METRIX METER) DEVI 1 kit by Does not apply route 4 (four) times daily. 04/04/17   Brayton Caves, PA-C  Calcium 600 MG tablet Take 600 mg by mouth daily.  07/14/13   Patrecia Pour, NP  cyclobenzaprine (FLEXERIL) 10 MG tablet Take 1 tablet (10 mg total) by mouth 3 (three) times daily as needed. Patient taking differently: Take 10 mg by mouth 3 (three) times daily as needed for muscle spasms.  04/04/17   Brayton Caves, PA-C  escitalopram (LEXAPRO) 10 MG tablet Take 1 tablet (10 mg total) daily by mouth. 05/28/17 11/24/17  Elsie Stain, MD  fluticasone furoate-vilanterol (BREO ELLIPTA) 200-25 MCG/INH AEPB Inhale 1 puff daily into the lungs. 05/28/17   Elsie Stain, MD  furosemide (LASIX) 20 MG tablet Take 1 tablet (20 mg total) by mouth daily. 04/04/17   Brayton Caves, PA-C  gabapentin (NEURONTIN) 600 MG tablet TAKE 1 TABLET BY MOUTH 4 TIMES DAILY 05/28/17   Elsie Stain, MD  glucose blood (TRUE METRIX BLOOD GLUCOSE TEST) test strip Use as instructed 04/04/17   Brayton Caves, PA-C  hydrOXYzine (VISTARIL) 25 MG capsule TAKE 25MG BY MOUTH THREE TIMES DAILY Patient taking differently: Take 25  mg by mouth 3 (three) times daily.  05/28/17   Elsie Stain, MD  ipratropium-albuterol (DUONEB) 0.5-2.5 (3) MG/3ML SOLN Take 3 mLs every 6 (six) hours as needed by nebulization. Patient taking differently: Take 3 mLs by nebulization every 6 (six) hours as needed (wheezing).  05/28/17   Elsie Stain, MD  labetalol (NORMODYNE) 100 MG tablet Take 1 tablet (100 mg total) 2 (two) times daily by mouth. 05/28/17   Elsie Stain, MD  levothyroxine (SYNTHROID, LEVOTHROID) 100 MCG tablet Take 1 tablet (100 mcg total) daily by mouth. 05/28/17   Joya Gaskins,  Burnett Harry, MD  losartan (COZAAR) 50 MG tablet Take 1.5 tablets (75 mg total) by mouth daily. 07/22/17   Ladell Pier, MD  Melatonin 10 MG SUBL Place 20 mg under the tongue at bedtime.    [provider]  montelukast (SINGULAIR) 10 MG tablet Take 1 tablet (10 mg total) at bedtime by mouth. 05/28/17   Elsie Stain, MD  nicotine (NICODERM CQ - DOSED IN MG/24 HOURS) 21 mg/24hr patch Place 1 patch (21 mg total) onto the skin daily. 07/10/17   Charlott Rakes, MD  omeprazole (PRILOSEC) 40 MG capsule Take 1 capsule (40 mg total) daily by mouth. 05/28/17   Elsie Stain, MD  ondansetron (ZOFRAN) 4 MG tablet Take 1 tablet (4 mg total) every 8 (eight) hours as needed by mouth for nausea or vomiting. 05/28/17   Elsie Stain, MD  predniSONE (STERAPRED UNI-PAK 21 TAB) 10 MG (21) TBPK tablet Dispense one 6 day pack. Take as directed with food. 09/12/17   Melynda Ripple, MD  ticagrelor (BRILINTA) 90 MG TABS tablet Take 1 tablet (90 mg total) 2 (two) times daily by mouth. 05/28/17   Elsie Stain, MD  traMADol (ULTRAM) 50 MG tablet Take 1 tablet (50 mg total) every 12 (twelve) hours as needed by mouth. Patient taking differently: Take 50 mg by mouth every 12 (twelve) hours as needed for moderate pain.  05/28/17   Elsie Stain, MD  triamcinolone cream (KENALOG) 0.1 % Apply 1 application topically 2 (two) times daily. 07/22/17    Ladell Pier, MD  TRUEPLUS LANCETS 28G MISC 28 g by Does not apply route QID. 04/04/17   Brayton Caves, PA-C    Family History Family History  Problem Relation Age of Onset  . Heart disease Mother   . Hypertension Mother   . Stroke Mother   . Heart disease Father   . Hypertension Father   . Diabetes Father   . Cancer Maternal Aunt        breast  . Thyroid disease Paternal Aunt   . Heart disease Maternal Grandfather   . Anesthesia problems Daughter   . Cancer Paternal Grandmother        mouth  . Hypotension Neg Hx   . Malignant hyperthermia Neg Hx   . Pseudochol deficiency Neg Hx     Social History Social History   Tobacco Use  . Smoking status: Current Every Day Smoker    Packs/day: 0.50    Years: 49.00    Pack years: 24.50    Types: Cigarettes  . Smokeless tobacco: Never Used  Substance Use Topics  . Alcohol use: Yes    Comment: 03/25/2017 "glass of wine once in a blue moon; maybe q 5-6 months"  . Drug use: No    Comment: 03/25/2017 "used cocaine a couple days ago; smoke pot from time to time"     Allergies   Other; Codeine; and Latex   Review of Systems Review of Systems  Constitutional: Positive for appetite change.  HENT: Negative for congestion.   Respiratory: Positive for cough, shortness of breath and wheezing.   Cardiovascular: Negative for chest pain and leg swelling.  Gastrointestinal: Negative for abdominal pain.  Genitourinary: Negative for dysuria.  Musculoskeletal: Negative for back pain.  Neurological: Negative for seizures.  Psychiatric/Behavioral: Negative for behavioral problems.     Physical Exam Updated Vital Signs BP 139/79   Pulse 98   Temp 98.5 F (36.9 C) (Oral)   Resp (!) 22  Ht '5\' 1"'$  (1.549 m)   Wt 57.2 kg (126 lb)   SpO2 94%   BMI 23.81 kg/m   Physical Exam  Constitutional: She appears well-developed.  HENT:  Head: Normocephalic.  Eyes: EOM are normal.  Cardiovascular: Normal rate.  Pulmonary/Chest: She  is in respiratory distress.  Dyspneic with some accessory muscle use.  Diffuse wheezes and prolonged expiration.  Abdominal: Soft. There is no tenderness.  Musculoskeletal:       Right lower leg: She exhibits no edema.       Left lower leg: She exhibits no edema.  Neurological: She is alert.  Skin: Skin is warm. Capillary refill takes less than 2 seconds.     ED Treatments / Results  Labs (all labs ordered are listed, but only abnormal results are displayed) Labs Reviewed  CBC WITH DIFFERENTIAL/PLATELET - Abnormal; Notable for the following components:      Result Value   WBC 10.6 (*)    Platelets 401 (*)    Neutro Abs 7.8 (*)    Monocytes Absolute 1.7 (*)    All other components within normal limits  COMPREHENSIVE METABOLIC PANEL - Abnormal; Notable for the following components:   Chloride 99 (*)    Glucose, Bld 121 (*)    Total Protein 6.3 (*)    All other components within normal limits  I-STAT ARTERIAL BLOOD GAS, ED - Abnormal; Notable for the following components:   pCO2 arterial 50.8 (*)    pO2, Arterial 80.0 (*)    Bicarbonate 29.0 (*)    Acid-Base Excess 3.0 (*)    All other components within normal limits  TROPONIN I    EKG  EKG Interpretation  Date/Time:  Monday September 15 2017 15:48:06 EST Ventricular Rate:  91 PR Interval:    QRS Duration: 88 QT Interval:  364 QTC Calculation: 446 R Axis:   92 Text Interpretation:  Sinus rhythm Right atrial enlargement Right axis deviation ST elevation, consider inferior injury Confirmed by Davonna Belling [phone]) on 09/15/2017 5:56:15 PM       Radiology Dg Chest Portable 1 View  Result Date: 09/15/2017 CLINICAL DATA:  Shortness of breath and wheezing. Nonproductive cough. EXAM: PORTABLE CHEST 1 VIEW COMPARISON:  Chest x-ray dated September 12, 2017. FINDINGS: The heart size and mediastinal contours are within normal limits. Normal pulmonary vascularity. The lungs remain hyperinflated. No focal consolidation, pleural  effusion, or pneumothorax. No acute osseous abnormality. Old healed displaced left mid clavicle fracture. IMPRESSION: COPD.  No active cardiopulmonary disease. Electronically Signed   By: Titus Dubin M.D.   On: 09/15/2017 17:00    Procedures Procedures (including critical care time)  Medications Ordered in ED Medications  albuterol (PROVENTIL, VENTOLIN) (5 MG/ML) 0.5% continuous inhalation solution (  Not Given 09/15/17 1558)  ipratropium (ATROVENT) 0.02 % nebulizer solution (  Not Given 09/15/17 1558)  magnesium sulfate IVPB 2 g 50 mL (0 g Intravenous Stopped 09/15/17 1723)  albuterol (PROVENTIL,VENTOLIN) solution continuous neb (10 mg/hr Nebulization Given 09/15/17 1554)  ipratropium (ATROVENT) nebulizer solution 0.5 mg (0.5 mg Nebulization Given 09/15/17 1554)  albuterol (PROVENTIL,VENTOLIN) solution continuous neb (10 mg/hr Nebulization Given 09/15/17 1750)     Initial Impression / Assessment and Plan / ED Course  I have reviewed the triage vital signs and the nursing notes.  Pertinent labs & imaging results that were available during my care of the patient were reviewed by me and considered in my medical decision making (see chart for details).     Patient with  COPD exacerbation.  Hypercapnic and hypoxic.  However likely some of this is chronic.  Breathing treatment somewhat improved after hour-long nebulizers but still dyspneic.  Wil  CRITICAL CARE Performed by: Davonna Belling Total critical care time: 30 minutes Critical care time was exclusive of separately billable procedures and treating other patients. Critical care was necessary to treat or prevent imminent or life-threatening deterioration. Critical care was time spent personally by me on the following activities: development of treatment plan with patient and/or surrogate as well as nursing, discussions with consultants, evaluation of patient's response to treatment, examination of patient, obtaining history from patient or  surrogate, ordering and performing treatments and interventions, ordering and review of laboratory studies, ordering and review of radiographic studies, pulse oximetry and re-evaluation of patient's condition.   Final Clinical Impressions(s) / ED Diagnoses   Final diagnoses:  COPD exacerbation [facility])    ED Discharge Orders    None       Davonna Belling, MD 09/15/17 [phone]

## 2017-09-16 ENCOUNTER — Encounter (HOSPITAL_COMMUNITY): Payer: Self-pay | Admitting: General Practice

## 2017-09-16 ENCOUNTER — Other Ambulatory Visit: Payer: Self-pay

## 2017-09-16 DIAGNOSIS — Z885 Allergy status to narcotic agent status: Secondary | ICD-10-CM

## 2017-09-16 DIAGNOSIS — Z91018 Allergy to other foods: Secondary | ICD-10-CM

## 2017-09-16 DIAGNOSIS — I2510 Atherosclerotic heart disease of native coronary artery without angina pectoris: Secondary | ICD-10-CM

## 2017-09-16 DIAGNOSIS — Z79899 Other long term (current) drug therapy: Secondary | ICD-10-CM

## 2017-09-16 DIAGNOSIS — F1290 Cannabis use, unspecified, uncomplicated: Secondary | ICD-10-CM

## 2017-09-16 DIAGNOSIS — E039 Hypothyroidism, unspecified: Secondary | ICD-10-CM

## 2017-09-16 DIAGNOSIS — J441 Chronic obstructive pulmonary disease with (acute) exacerbation: Principal | ICD-10-CM

## 2017-09-16 DIAGNOSIS — I10 Essential (primary) hypertension: Secondary | ICD-10-CM

## 2017-09-16 DIAGNOSIS — F17210 Nicotine dependence, cigarettes, uncomplicated: Secondary | ICD-10-CM

## 2017-09-16 DIAGNOSIS — Z955 Presence of coronary angioplasty implant and graft: Secondary | ICD-10-CM

## 2017-09-16 DIAGNOSIS — Z91120 Patient's intentional underdosing of medication regimen due to financial hardship: Secondary | ICD-10-CM

## 2017-09-16 DIAGNOSIS — Z91040 Latex allergy status: Secondary | ICD-10-CM

## 2017-09-16 DIAGNOSIS — J3081 Allergic rhinitis due to animal (cat) (dog) hair and dander: Secondary | ICD-10-CM

## 2017-09-16 LAB — BASIC METABOLIC PANEL
ANION GAP: 11 (ref 5–15)
BUN: 18 mg/dL (ref 6–20)
CALCIUM: 9.1 mg/dL (ref 8.9–10.3)
CHLORIDE: 100 mmol/L — AB (ref 101–111)
CO2: 29 mmol/L (ref 22–32)
Creatinine, Ser: 0.83 mg/dL (ref 0.44–1.00)
GFR calc non Af Amer: >60 mL/min (ref >60)
GLUCOSE: 140 mg/dL — AB (ref 65–99)
POTASSIUM: 4.2 mmol/L (ref 3.5–5.1)
Sodium: 140 mmol/L (ref 135–145)

## 2017-09-16 MED ORDER — GABAPENTIN 300 MG PO CAPS
600.0000 mg | ORAL_CAPSULE | Freq: Four times a day (QID) | ORAL | Status: DC
  Administered 2017-09-16 (×3): 600 mg via ORAL
  Filled 2017-09-16 (×3): qty 2

## 2017-09-16 MED ORDER — PREDNISONE 20 MG PO TABS: ORAL_TABLET | ORAL | 0 refills | Status: DC

## 2017-09-16 MED ORDER — AZITHROMYCIN 250 MG PO TABS: ORAL_TABLET | ORAL | 0 refills | Status: DC

## 2017-09-16 MED ORDER — AZITHROMYCIN 250 MG PO TABS
250.0000 mg | ORAL_TABLET | ORAL | Status: DC
  Filled 2017-09-16: qty 1

## 2017-09-16 MED ORDER — PANTOPRAZOLE SODIUM 40 MG PO TBEC
40.0000 mg | DELAYED_RELEASE_TABLET | Freq: Every day | ORAL | Status: DC
  Administered 2017-09-16: 40 mg via ORAL
  Filled 2017-09-16: qty 1

## 2017-09-16 NOTE — Progress Notes (Signed)
Pt taken off bipap and placed on 3L [place] at this time. Pt denies SOB, no increased WOB, VS within normal limits, diminished bilateral BS.

## 2017-09-16 NOTE — Discharge Summary (Signed)
Name: [name] [name] MRN: [id number] DOB: Jan 09, 1956 62 y.o. PCP: Ladell Pier, MD  Date of Admission: 09/15/2017  3:38 PM Date of Discharge: 09/16/2017 Attending Physician: Lucious Groves, DO  Discharge Diagnosis: Active Problems:   COPD with acute exacerbation [facility])  Discharge Medications: Allergies as of 09/16/2017      Reactions   Avocado Anaphylaxis   Latex Shortness Of Breath, Rash   Codeine Nausea Only      Medication List    STOP taking these medications   amoxicillin-clavulanate 875-125 MG tablet Commonly known as:  AUGMENTIN   predniSONE 10 MG (21) Tbpk tablet Commonly known as:  STERAPRED UNI-PAK 21 TAB Replaced by:  predniSONE 20 MG tablet     TAKE these medications   albuterol 108 (90 Base) MCG/ACT inhaler Commonly known as:  VENTOLIN HFA Inhale 2 puffs every 6 (six) hours as needed into the lungs for wheezing or shortness of breath.   aspirin EC 81 MG tablet Take 81 mg by mouth daily.   atorvastatin 40 MG tablet Commonly known as:  LIPITOR Take 1 tablet (40 mg total) daily by mouth. What changed:  when to take this   azithromycin 250 MG tablet Commonly known as:  ZITHROMAX Please take one a day for the next 4 days   calcium carbonate 600 MG tablet Commonly known as:  OS-CAL Take 600 mg by mouth daily.   cyclobenzaprine 10 MG tablet Commonly known as:  FLEXERIL Take 1 tablet (10 mg total) by mouth 3 (three) times daily as needed. What changed:  reasons to take this   escitalopram 10 MG tablet Commonly known as:  LEXAPRO Take 1 tablet (10 mg total) daily by mouth.   fluticasone furoate-vilanterol 200-25 MCG/INH Aepb Commonly known as:  BREO ELLIPTA Inhale 1 puff daily into the lungs.   furosemide 20 MG tablet Commonly known as:  LASIX Take 1 tablet (20 mg total) by mouth daily.   gabapentin 600 MG tablet Commonly known as:  NEURONTIN TAKE 1 TABLET BY MOUTH 4 TIMES DAILY What changed:    how much to take  how to take  this  when to take this  additional instructions   glucose blood test strip Commonly known as:  TRUE METRIX BLOOD GLUCOSE TEST Use as instructed   hydrOXYzine 25 MG capsule Commonly known as:  VISTARIL TAKE 25MG BY MOUTH THREE TIMES DAILY What changed:    how much to take  how to take this  when to take this  additional instructions   ipratropium-albuterol 0.5-2.5 (3) MG/3ML Soln Commonly known as:  DUONEB Take 3 mLs every 6 (six) hours as needed by nebulization. What changed:  reasons to take this   labetalol 100 MG tablet Commonly known as:  NORMODYNE Take 1 tablet (100 mg total) 2 (two) times daily by mouth.   levothyroxine 100 MCG tablet Commonly known as:  SYNTHROID, LEVOTHROID Take 1 tablet (100 mcg total) daily by mouth.   losartan 50 MG tablet Commonly known as:  COZAAR Take 1.5 tablets (75 mg total) by mouth daily. What changed:  when to take this   Melatonin 10 MG Subl Place 20 mg under the tongue at bedtime.   montelukast 10 MG tablet Commonly known as:  SINGULAIR Take 1 tablet (10 mg total) at bedtime by mouth.   naproxen sodium 220 MG tablet Commonly known as:  ALEVE Take 440 mg by mouth 2 (two) times daily.   nicotine 21 mg/24hr patch Commonly known as:  NICODERM CQ - dosed in mg/24 hours Place 1 patch (21 mg total) onto the skin daily.   omeprazole 40 MG capsule Commonly known as:  PRILOSEC Take 1 capsule (40 mg total) daily by mouth. What changed:  when to take this   ondansetron 4 MG tablet Commonly known as:  ZOFRAN Take 1 tablet (4 mg total) every 8 (eight) hours as needed by mouth for nausea or vomiting.   predniSONE 20 MG tablet Commonly known as:  DELTASONE Take 2 tablets daily (40 mg) for 6 days, THEN take 1 tablet daily (20 mg) for 6 days, THEN take 1/2 tablet daily (10 mg) for 4 days. Replaces:  predniSONE 10 MG (21) Tbpk tablet   ticagrelor 90 MG Tabs tablet Commonly known as:  BRILINTA Take 1 tablet (90 mg total) 2  (two) times daily by mouth.   traMADol 50 MG tablet Commonly known as:  ULTRAM Take 1 tablet (50 mg total) every 12 (twelve) hours as needed by mouth. What changed:  reasons to take this   triamcinolone cream 0.1 % Commonly known as:  KENALOG Apply 1 application topically 2 (two) times daily. What changed:  additional instructions   TRUE METRIX METER Devi 1 kit by Does not apply route 4 (four) times daily.   TRUEPLUS LANCETS 28G Misc 28 g by Does not apply route QID.       Disposition and follow-up:   [name] was discharged from [facility] in Stable condition.  At the hospital follow up visit please address:  1.  [name] [name] presented with SOB consistent with reactive airway disease/COPD exacerbation. She clinically responded to IV steroids, azithromycin, Mg, and scheduled breathing treatments. She was discharged with azithromycin and 2-week prednisone taper. Please assess compliance with these medications, as patient states it is difficult to take all of her medications on a daily basis.  Patient endorses poor symptomatic control of COPD at baseline, stating that she frequently has to use rescue inhalers in spite of daily BREO and BID DuoNeb - although the patient's report of medicine use was inconsistent throughout hospitalization. Suspect patient's frequent exacerbations due to continued allergen/trigger exposure (cigarette/marijuana use), medication noncompliance 2/2 financial strain, and/or poor control current medications baseline.   2.  Labs / imaging needed at time of follow-up: None  3.  Pending labs/ test needing follow-up: None  Follow-up Appointments: Follow-up Information    Johnson, Deborah B, MD. Go on 09/18/2017.   Specialty:  Internal Medicine Why:  Two Appointments Scheduled: 09/18/2017 2:30 PM (Arrive by 2:15 PM) Discuss hospitalization and chronic COPD medications. 10/28/2017 4:15 PM (Arrive by 4:00 PM)  Contact information: [address] [place] [address] [phone]        Wright, Patrick E, MD. Schedule an appointment as soon as possible for a visit in 1 week(s).   Specialty:  Pulmonary Disease Why:  Please make an appointment to discuss your chronic lung disease and maintenance medications you may need. Contact information: [address] [place] [address] [phone]           Hospital Course by problem list: Active Problems:   COPD with acute exacerbation (HCC)  [name] [name] is a 61 yo with a PMH of anxiety/depression, COPD, CAD s/p multiple DES placement, HTN, and hypothyroidism who presented with signs and symptoms of a COPD exacerbation. She was admitted to the internal medicine teaching service for management. The specific problems addressed during admission are as follows:  COPD exacerbation:Patient   presented with 4 week history of progressively worsening shortness of breath. Patient endorsed chest tightness in addition to SOB and intermittent wheezing, but denied productive cough with change in color/quantity of sputum. Patient did, however, endorse post-nasal drip which is worsened with exposure to allergens including dogs and cigarette smoke/marijuana use. Overall her clinical history was most consistent with COPD exacerbation and/or reactive airway disease. Patient initially placed on BiPAP in the ED upon arrival for increased work of breathing and decreased air movement throughout all lung fields. Patient received IV solumedrol, Mg, azithromycin, and scheduled DuoNebs (q6 hours) upon transfer to the floor from the ED and she was quickly weaned off BiPAP overnight. Upon reevaluation in the morning the patient's symptoms were overall much improved and she was breathing comfortably on room air without evidence of respiratory distress or hypoxia. Patient was instructed to continue azithromycin 250 mg x4 doses to complete a total of 5 days of therapy. Patient was transitioned to oral  prednisone with a planned 2 week taper on discharge (40 mg x6 days, 20 mg x6 days, 10 mg x3 days). Patient instructed to avoid allergens and cigarette/marijuana use, as these can irritate airways and increase inflammation. Patient was also instructed to keep close follow up with her PCP and pulmonologist to see if she needs additional medications added to her regimen, as the patient expressed poor control of symptoms at baseline.    Discharge Vitals:   BP (!) 161/80 (BP Location: Right Arm)   Pulse 81   Temp 98.5 F (36.9 C) (Oral)   Resp 13   Ht 5' 1" (1.549 m)   Wt 126 lb (57.2 kg)   SpO2 93%   BMI 23.81 kg/m   Pertinent Labs, Studies, and Procedures:   BMP Latest Ref Rng & Units 09/16/2017 09/15/2017 05/28/2017  Glucose 65 - 99 mg/dL 140(H) 121(H) 100(H)  BUN 6 - 20 mg/dL _0 Creatinine 0.44 - 1.00 mg/dL 0.83 0.82 0.82  BUN/Creat Ratio 12 - 28 - - 12  Sodium 135 - 145 mmol/L 140 138 141  Potassium 3.5 - 5.1 mmol/L 4.2 4.7 4.5  Chloride 101 - 111 mmol/L 100(L) 99(L) 101  CO2 22 - 32 mmol/L _1 Calcium 8.9 - 10.3 mg/dL 9.1 9.1 9.9   CBC Latest Ref Rng & Units 09/15/2017 03/27/2017 03/26/2017  WBC 4.0 - 10.5 K/uL 10.6(H) 14.8(H) 9.9  Hemoglobin 12.0 - 15.0 g/dL 14.0 12.9 13.3  Hematocrit 36.0 - 46.0 % 42.1 38.6 40.7  Platelets 150 - 400 K/uL 401(H) 333 329   ABG    Component Value Date/Time   PHART 7.365 09/15/2017 1744   PCO2ART 50.8 (H) 09/15/2017 1744   PO2ART 80.0 (L) 09/15/2017 1744   HCO3 29.0 (H) 09/15/2017 1744   TCO2 31 09/15/2017 1744   O2SAT 95.0 09/15/2017 1744   Chest X Ray, 09/16/2017 COMPARISON:  Chest x-ray dated September 12, 2017.  FINDINGS: The heart size and mediastinal contours are within normal limits. Normal pulmonary vascularity. The lungs remain hyperinflated. No focal consolidation, pleural effusion, or pneumothorax. No acute osseous abnormality. Old healed displaced left mid clavicle fracture.  IMPRESSION: COPD.  No active cardiopulmonary  disease.  Discharge Instructions: Discharge Instructions    Diet - low sodium heart healthy   Complete by:  As directed    Discharge instructions   Complete by:  As directed    You were evaluated for progressively worsening shortness of breath. You were felt to have an exacerbation of chronic  lung disease (COPD). Please take the antibiotic azithromycin once a day for the next 4 days. You will also have to take steroids for the next two weeks. Please follow the instructions given to you on the bottle to take these medications appropriately. Please follow up with your outpatient physicians regarding your hospitalization and your COPD treatments.   Increase activity slowly   Complete by:  As directed      Signed: , , MD 09/16/2017, 6:19 PM   Pager: [phone]  

## 2017-09-16 NOTE — ED Notes (Signed)
Pt given a coca cola, per Holland Commons, RN.

## 2017-09-16 NOTE — ED Notes (Signed)
Pt sleeping with slow deep respirations , no nasal flaring and pink lips noted.

## 2017-09-16 NOTE — Progress Notes (Signed)
Patient weaned to room air. O2 sat 95-98.  Will continue to monitor.

## 2017-09-16 NOTE — Progress Notes (Signed)
Patient [name] [name] weaned to 1L on arrival to room.  Satting 93%.

## 2017-09-16 NOTE — Progress Notes (Signed)
   Subjective:  Patient seen laying comfortably in bed today in no acute distress. Patient states breathing overall much improved from yesterday when she came into the hospital. Patient has been stable off of BiPAP for a few hours now and denies problems breathing/chest discomfort off this machine. Patient endorses slight tremulousness with breathing treatments but no other acute complaints. Patient counseled on the importance of smoking cessation during acute COPD exacerbations.   Objective:  Vital signs in last 24 hours: Vitals:   09/16/17 0830 09/16/17 0930 09/16/17 1015 09/16/17 1030  BP: (!) 152/82 (!) 152/66 (!) 156/82 (!) 163/96  Pulse: (!) 111 (!) 54 82 78  Resp: 19 17 17 20   Temp:      TempSrc:      SpO2: (!) 88% 96% 97% 95%  Weight:      Height:       Physical Exam  Constitutional:  Thin woman laying comfortably in bed in no acute distress with [place] in place, oxygen turned off during interview.  Cardiovascular: Normal rate, regular rhythm and intact distal pulses. Exam reveals no friction rub.  No murmur heard. Respiratory:  [place] in place. Oxygen turned off during interview, saturations maintained >88% throughout. No nasal flaring or accessory muscle use. No signs of cyanosis. Increased air movement throughout all lung fields compared to prior exams. Mild, intermittent end expiratory wheezing with slightly prolonged expiratory phase. No crackles appreciated.   GI: Soft. Bowel sounds are normal. She exhibits no distension. There is no tenderness. There is no rebound.  Musculoskeletal: She exhibits no edema (of bilateral lower extremities) or tenderness (of bilateral lower extremities).  Skin: Skin is warm and dry. No rash noted. No erythema.  Psychiatric: She has a normal mood and affect. Her behavior is normal.   Assessment/Plan:  Active Problems:   COPD with acute exacerbation (HCC)  [name] [name] is a 62 yo with a PMH of anxiety/depression, COPD, CAD s/p multiple DES  placement, HTN, and hypothyroidism who presented with signs and symptoms of a COPD exacerbation. She was admitted to the internal medicine teaching service for management. The specific problems addressed during admission are as follows:  COPD exacerbation: Patient's symptoms overall improved since admission last night, now breathing comfortably off of BiPAP without evidence of respiratory distress. Patient to continue azithromycin 250 mg x4 doses to complete a total of 5 days of therapy. Patient will transition to oral prednisone with plan for slow, 2 week taper  -Oral azithromycin for 5 days -Prednisone 40 mg daily, outpatient 2 week taper on discharge -DuoNebs q6 hours scheduled -Continue to wean oxygen as tolerated, goal >88-90% -Ambulate patient off oxygen -Patient will need close follow up with PCP and pulmonologist for continued COPD care and medication management given hx of recent multiple COPD exacerbations/ED visits   CAD s/p stent placement:  -Continue home atorvastatin 40 mg, Brilinta 90 mg BID  HTN: Patient's BP elevated during hospitalization, currently 160s/90s. Resuming home medications today. Will need to continue outpatient follow up -Home losartan 75 mg, lasix 20 mg, and labetalol 100 mg BID  FEN/GI: -Heart Healthy diet -No IVF, replace electrolytes as needed  VTE Prophylaxis: Lovenox daily Code Status: Full  Dispo: Anticipated discharge in approximately 0-1 day(s).   Thomasene Ripple, MD 09/16/2017, 11:02 AM Pager: [phone]

## 2017-09-16 NOTE — Progress Notes (Signed)
Patient arrived from the ED to 4E room 19.  Telemetry monitor applied and CCMD notified.  Patient oriented to unit and room to include call light and phone.  Will continue to monitor.

## 2017-09-16 NOTE — ED Notes (Signed)
Informed the pt she is NPO at this time, per [facility], Therapist, sports.

## 2017-09-16 NOTE — ED Notes (Signed)
Pt given a cup of coffee, per Vikki Ports, Therapist, sports.

## 2017-09-16 NOTE — Progress Notes (Signed)
Patient ambulated in hallway approximately 500 feet on room air with O2 sats between 89 and 93.  Patient tolerated walk well but felt weak.

## 2017-09-16 NOTE — Care Management Note (Addendum)
Case Management Note  Patient Details  Name: [name] [name] MRN: [id number] Date of Birth: [birth year]  Subjective/Objective:    COPD exac               Action/Plan: NCM spoke to pt and scheduled dc today. Pt has appt on [place] on 09/18/2017 and she will need to keep appt. She has neb machine at home. She uses [facility] pharmacy for meds. Will provided pt a goodrx coupon for Zithromax, price $8.    Expected Discharge Date:                  Expected Discharge Plan:  Home/Self Care  In-House Referral:  NA  Discharge planning Services  CM Consult  Post Acute Care Choice:  NA Choice offered to:  NA  DME Arranged:  N/A DME Agency:  NA  HH Arranged:  NA HH Agency:  NA  Status of Service:  Completed, signed off  If discussed at [place] of Stay Meetings, dates discussed:    Additional Comments:  Erenest Rasher, RN 09/16/2017, 4:00 PM

## 2017-09-16 NOTE — Progress Notes (Signed)
Order received to discharge patient.  Telemetry monitor removed and CCMD notified.  PIV access removed.  Discharge instructions, follow up, medications and instructions for their use discussed with patient. 

## 2017-09-16 NOTE — ED Notes (Signed)
Pt found to be 86% on RA. [place] replaced at 2lpm

## 2017-09-16 NOTE — ED Notes (Signed)
[name] [name] (Service Resp) called @ 0857/Breakfast Tray Order-per RN-called by Levada Dy

## 2017-09-17 MED FILL — **BREO ELLIPTA 200-25 MCG I: 200-25 MCG | 7 days supply | Qty: 14 | Fill #2

## 2017-09-18 ENCOUNTER — Ambulatory Visit: Payer: Self-pay | Admitting: Internal Medicine

## 2017-09-18 ENCOUNTER — Encounter: Payer: Self-pay | Admitting: Internal Medicine

## 2017-09-18 ENCOUNTER — Ambulatory Visit: Payer: Self-pay | Admitting: Licensed Clinical Social Worker

## 2017-09-18 ENCOUNTER — Ambulatory Visit: Payer: Self-pay | Attending: Internal Medicine | Admitting: Internal Medicine

## 2017-09-18 VITALS — BP 123/88 | HR 86 | Temp 99.1°F | Resp 16 | Ht 65.5 in | Wt 119.6 lb

## 2017-09-18 DIAGNOSIS — F419 Anxiety disorder, unspecified: Secondary | ICD-10-CM | POA: Insufficient documentation

## 2017-09-18 DIAGNOSIS — F32A Depression, unspecified: Secondary | ICD-10-CM

## 2017-09-18 DIAGNOSIS — F329 Major depressive disorder, single episode, unspecified: Secondary | ICD-10-CM

## 2017-09-18 DIAGNOSIS — R0982 Postnasal drip: Secondary | ICD-10-CM

## 2017-09-18 DIAGNOSIS — Z8249 Family history of ischemic heart disease and other diseases of the circulatory system: Secondary | ICD-10-CM | POA: Insufficient documentation

## 2017-09-18 DIAGNOSIS — Z885 Allergy status to narcotic agent status: Secondary | ICD-10-CM | POA: Insufficient documentation

## 2017-09-18 DIAGNOSIS — Z79899 Other long term (current) drug therapy: Secondary | ICD-10-CM | POA: Insufficient documentation

## 2017-09-18 DIAGNOSIS — Z7952 Long term (current) use of systemic steroids: Secondary | ICD-10-CM | POA: Insufficient documentation

## 2017-09-18 DIAGNOSIS — Z9889 Other specified postprocedural states: Secondary | ICD-10-CM | POA: Insufficient documentation

## 2017-09-18 DIAGNOSIS — Z90710 Acquired absence of both cervix and uterus: Secondary | ICD-10-CM | POA: Insufficient documentation

## 2017-09-18 DIAGNOSIS — Z91040 Latex allergy status: Secondary | ICD-10-CM | POA: Insufficient documentation

## 2017-09-18 DIAGNOSIS — Z955 Presence of coronary angioplasty implant and graft: Secondary | ICD-10-CM | POA: Insufficient documentation

## 2017-09-18 DIAGNOSIS — F17200 Nicotine dependence, unspecified, uncomplicated: Secondary | ICD-10-CM

## 2017-09-18 DIAGNOSIS — Z823 Family history of stroke: Secondary | ICD-10-CM | POA: Insufficient documentation

## 2017-09-18 DIAGNOSIS — Z1231 Encounter for screening mammogram for malignant neoplasm of breast: Secondary | ICD-10-CM

## 2017-09-18 DIAGNOSIS — G8929 Other chronic pain: Secondary | ICD-10-CM | POA: Insufficient documentation

## 2017-09-18 DIAGNOSIS — Z1239 Encounter for other screening for malignant neoplasm of breast: Secondary | ICD-10-CM

## 2017-09-18 DIAGNOSIS — E039 Hypothyroidism, unspecified: Secondary | ICD-10-CM

## 2017-09-18 DIAGNOSIS — Z833 Family history of diabetes mellitus: Secondary | ICD-10-CM | POA: Insufficient documentation

## 2017-09-18 DIAGNOSIS — J438 Other emphysema: Secondary | ICD-10-CM | POA: Insufficient documentation

## 2017-09-18 DIAGNOSIS — F17210 Nicotine dependence, cigarettes, uncomplicated: Secondary | ICD-10-CM | POA: Insufficient documentation

## 2017-09-18 DIAGNOSIS — Z7982 Long term (current) use of aspirin: Secondary | ICD-10-CM | POA: Insufficient documentation

## 2017-09-18 DIAGNOSIS — K921 Melena: Secondary | ICD-10-CM

## 2017-09-18 DIAGNOSIS — K219 Gastro-esophageal reflux disease without esophagitis: Secondary | ICD-10-CM | POA: Insufficient documentation

## 2017-09-18 DIAGNOSIS — I2510 Atherosclerotic heart disease of native coronary artery without angina pectoris: Secondary | ICD-10-CM | POA: Insufficient documentation

## 2017-09-18 DIAGNOSIS — R634 Abnormal weight loss: Secondary | ICD-10-CM

## 2017-09-18 DIAGNOSIS — J439 Emphysema, unspecified: Secondary | ICD-10-CM

## 2017-09-18 DIAGNOSIS — J302 Other seasonal allergic rhinitis: Secondary | ICD-10-CM

## 2017-09-18 DIAGNOSIS — E785 Hyperlipidemia, unspecified: Secondary | ICD-10-CM | POA: Insufficient documentation

## 2017-09-18 DIAGNOSIS — L409 Psoriasis, unspecified: Secondary | ICD-10-CM | POA: Insufficient documentation

## 2017-09-18 DIAGNOSIS — I10 Essential (primary) hypertension: Secondary | ICD-10-CM | POA: Insufficient documentation

## 2017-09-18 MED ORDER — NICOTINE 21 MG/24HR TD PT24: 21.0000 mg | MEDICATED_PATCH | Freq: Every day | TRANSDERMAL | 0 refills | Status: DC

## 2017-09-18 MED ORDER — FLUTICASONE PROPIONATE 50 MCG/ACT NA SUSP: 2.0000 | Freq: Every day | NASAL | 6 refills | Status: DC

## 2017-09-18 MED ORDER — LORATADINE 10 MG PO TABS: 10.0000 mg | ORAL_TABLET | Freq: Every day | ORAL | 11 refills | Status: DC

## 2017-09-18 MED ORDER — ESCITALOPRAM OXALATE 10 MG PO TABS: 15.0000 mg | ORAL_TABLET | Freq: Every day | ORAL | 6 refills | Status: DC

## 2017-09-18 MED ORDER — TIOTROPIUM BROMIDE MONOHYDRATE 18 MCG IN CAPS: 18.0000 ug | ORAL_CAPSULE | Freq: Every day | RESPIRATORY_TRACT | 12 refills | Status: DC

## 2017-09-18 MED ORDER — UMECLIDINIUM BROMIDE 62.5 MCG/INH IN AEPB: 1.0000 | INHALATION_SPRAY | Freq: Every day | RESPIRATORY_TRACT | 6 refills | Status: DC

## 2017-09-18 MED FILL — ESCITALOPRAM 10 MG TABLET: 10 | 30 days supply | Qty: 45 | Fill #0

## 2017-09-18 MED FILL — !INCRUSE ELLIPTA 62.5 MCG I: 62.5 | 30 days supply | Qty: 30 | Fill #0

## 2017-09-18 MED FILL — OMEPRAZOLE DR 40 MG CAPSULE: 40 | 30 days supply | Qty: 30 | Fill #3

## 2017-09-18 MED FILL — NICOTINE 21 MG/24HR PATCH: 21 | 28 days supply | Qty: 28 | Fill #0

## 2017-09-18 MED FILL — FLUTICASONE PROP 50 MCG SPR: 50 | 30 days supply | Qty: 16 | Fill #0

## 2017-09-18 NOTE — Progress Notes (Signed)
Patient ID: [name] [name], female    DOB: 14-May-1956  MRN: [id number]  CC: Hospitalization Follow-up   Subjective: Alekhya Gravlin is a 62 y.o. female who presents for chronic ds management/hospital f/u Her concerns today include:  Patient with history of pre-DM, GERD, hypothyroidism, HTN, HL, CAD with previous stent, COPD, substance abuse and tobacco dependence  1. Hosp with COPD exacerbation: on zithromax and Prednisone taper. -compliant with Breo once a day. -using breathing treatments Q4-6 hrs and Albuterol MDI a few times a day -endorses a lot of sinus drainage at night. On Singular -smoking less.  Using the patches.  "They do help me with the craving."   2.  Hypothyroid: taking Levothyroxine consistently  3.  Wgh loss: she weighed in the 140s in 02/2017 Since being dx with pre-DM, she stopped drinking Coca-Cola.  Use to drink 4 a day. Not using as much sugar in tea.  Eating less - never had a big appetite. Eating only breakfast and dinner.  She is stressed out and depressed.  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  She tearfully reported that her mother was hospitalized at the same time that she was.  Requested increased dose on Lexapro.  Would like to be referred to a psychiatrist. -She is up-to-date with colonoscopy.  Reports stools have been black the last few days. Out of Omeprazole for a while. Some nausea from post nasal drainage  HM: had hysterectomy in past for non-cancerous reason; LT ovary in place  Patient Active Problem List   Diagnosis Date Noted  . COPD with acute exacerbation ([place]) 09/15/2017  . Pre-diabetes 05/28/2017  . Emesis, persistent 05/28/2017  . QT prolongation 03/25/2017  . Encounter for screening mammogram for breast cancer 09/11/2015  . Psoriasis 06/22/2015  . COPD (chronic obstructive pulmonary disease) ([place]) 05/18/2015  . Anxiety and depression 07/10/2013  . Essential hypertension, benign 06/07/2013  . Dyslipidemia  06/07/2013  . Asthma, chronic 06/07/2013  . Emphysema lung ([place]) 06/07/2013  . Chronic back pain 06/07/2013  . CAD S/P percutaneous coronary angioplasty 06/07/2013  . GERD (gastroesophageal reflux disease) 06/07/2013  . Breast lump on left side at 3 o'clock position 10/13/2012  . S/P abdominal hysterectomy and right salpingo-oophorectomy 08/29/2011  . COPD exacerbation ([place]) 09/15/2009  . TOBACCO ABUSE 07/17/2009  . Chronic rhinitis 07/17/2009  . Lung nodule < 6cm on CT 07/17/2009  . ALLERGY, FOOD 07/17/2009  . Hypothyroidism 12/22/2008     Current Outpatient Medications on File Prior to Visit  Medication Sig Dispense Refill  . albuterol (VENTOLIN HFA) 108 (90 Base) MCG/ACT inhaler Inhale 2 puffs every 6 (six) hours as needed into the lungs for wheezing or shortness of breath. 18 g 2  . aspirin EC 81 MG tablet Take 81 mg by mouth daily.    Marland Kitchen atorvastatin (LIPITOR) 40 MG tablet Take 1 tablet (40 mg total) daily by mouth. (Patient taking differently: Take 40 mg by mouth at bedtime. ) 90 tablet 3  . azithromycin (ZITHROMAX) 250 MG tablet Please take one a day for the next 4 days 4 each 0  . Blood Glucose Monitoring Suppl (TRUE METRIX METER) DEVI 1 kit by Does not apply route 4 (four) times daily. 1 Device 0  . Calcium 600 MG tablet Take 600 mg by mouth daily.  60 tablet   . cyclobenzaprine (FLEXERIL) 10 MG tablet Take 1 tablet (10 mg total) by mouth 3 (three) times daily as needed. (Patient taking differently: Take 10  mg by mouth 3 (three) times daily as needed for muscle spasms. ) 30 tablet 2  . fluticasone furoate-vilanterol (BREO ELLIPTA) 200-25 MCG/INH AEPB Inhale 1 puff daily into the lungs. 60 each 3  . furosemide (LASIX) 20 MG tablet Take 1 tablet (20 mg total) by mouth daily. 30 tablet 3  . gabapentin (NEURONTIN) 600 MG tablet TAKE 1 TABLET BY MOUTH 4 TIMES DAILY (Patient taking differently: Take 600 mg by mouth 4 (four) times daily. ) 120 tablet 3  . glucose blood (TRUE METRIX BLOOD  GLUCOSE TEST) test strip Use as instructed 100 each 12  . hydrOXYzine (VISTARIL) 25 MG capsule TAKE 25MG BY MOUTH THREE TIMES DAILY (Patient taking differently: Take 25 mg by mouth 3 (three) times daily. ) 90 capsule 3  . ipratropium-albuterol (DUONEB) 0.5-2.5 (3) MG/3ML SOLN Take 3 mLs every 6 (six) hours as needed by nebulization. (Patient taking differently: Take 3 mLs by nebulization every 6 (six) hours as needed (wheezing). ) 360 mL 3  . labetalol (NORMODYNE) 100 MG tablet Take 1 tablet (100 mg total) 2 (two) times daily by mouth. 60 tablet 3  . levothyroxine (SYNTHROID, LEVOTHROID) 100 MCG tablet Take 1 tablet (100 mcg total) daily by mouth. 30 tablet 6  . losartan (COZAAR) 50 MG tablet Take 1.5 tablets (75 mg total) by mouth daily. (Patient taking differently: Take 75 mg by mouth at bedtime. ) 45 tablet 6  . Melatonin 10 MG SUBL Place 20 mg under the tongue at bedtime.    . montelukast (SINGULAIR) 10 MG tablet Take 1 tablet (10 mg total) at bedtime by mouth. 30 tablet 3  . naproxen sodium (ALEVE) 220 MG tablet Take 440 mg by mouth 2 (two) times daily.    Marland Kitchen omeprazole (PRILOSEC) 40 MG capsule Take 1 capsule (40 mg total) daily by mouth. (Patient taking differently: Take 40 mg by mouth at bedtime. ) 30 capsule 3  . ondansetron (ZOFRAN) 4 MG tablet Take 1 tablet (4 mg total) every 8 (eight) hours as needed by mouth for nausea or vomiting. 20 tablet 0  . predniSONE (DELTASONE) 20 MG tablet Take 2 tablets daily (40 mg) for 6 days, THEN take 1 tablet daily (20 mg) for 6 days, THEN take 1/2 tablet daily (10 mg) for 4 days. 17 tablet 0  . ticagrelor (BRILINTA) 90 MG TABS tablet Take 1 tablet (90 mg total) 2 (two) times daily by mouth. 60 tablet 6  . traMADol (ULTRAM) 50 MG tablet Take 1 tablet (50 mg total) every 12 (twelve) hours as needed by mouth. (Patient taking differently: Take 50 mg by mouth every 12 (twelve) hours as needed for moderate pain. ) 60 tablet 1  . triamcinolone cream (KENALOG) 0.1 %  Apply 1 application topically 2 (two) times daily. (Patient taking differently: Apply 1 application topically 2 (two) times daily. For psoriasis) 30 g 0  . TRUEPLUS LANCETS 28G MISC 28 g by Does not apply route QID. 120 each 2   No current facility-administered medications on file prior to visit.     Allergies  Allergen Reactions  . Avocado Anaphylaxis  . Latex Shortness Of Breath and Rash  . Codeine Nausea Only    Social History   Socioeconomic History  . Marital status: Divorced    Spouse name: Not on file  . Number of children: Not on file  . Years of education: Not on file  . Highest education level: Not on file  Social Needs  . Financial resource strain: Not  on file  . Food insecurity - worry: Not on file  . Food insecurity - inability: Not on file  . Transportation needs - medical: Not on file  . Transportation needs - non-medical: Not on file  Occupational History  . Not on file  Tobacco Use  . Smoking status: Current Every Day Smoker    Packs/day: 0.50    Years: 49.00    Pack years: 24.50    Types: Cigarettes  . Smokeless tobacco: Never Used  Substance and Sexual Activity  . Alcohol use: Yes    Comment: 03/25/2017 "glass of wine once in a blue moon; maybe q 5-6 months"  . Drug use: No    Comment: 03/25/2017 "used cocaine a couple days ago; smoke pot from time to time"  . Sexual activity: Not Currently    Birth control/protection: Surgical  Other Topics Concern  . Not on file  Social History Narrative  . Not on file    Family History  Problem Relation Age of Onset  . Heart disease Mother   . Hypertension Mother   . Stroke Mother   . Heart disease Father   . Hypertension Father   . Diabetes Father   . Cancer Maternal Aunt        breast  . Thyroid disease Paternal Aunt   . Heart disease Maternal Grandfather   . Anesthesia problems Daughter   . Cancer Paternal Grandmother        mouth  . Hypotension Neg Hx   . Malignant hyperthermia Neg Hx   .  Pseudochol deficiency Neg Hx     Past Surgical History:  Procedure Laterality Date  . ABDOMINAL EXPLORATION SURGERY  1977  . ABDOMINAL HYSTERECTOMY  1977   "left one of my ovaries"  . BACK SURGERY    . COLONOSCOPY    . CORONARY ANGIOPLASTY WITH STENT PLACEMENT  2011 X2   "regular stents didn't work; had to go back in in ~ 1 month and put in medicated stents"  . DILATION AND CURETTAGE OF UTERUS    . ESOPHAGOGASTRODUODENOSCOPY    . LEFT HEART CATH AND CORONARY ANGIOGRAPHY N/A 03/26/2017   Procedure: LEFT HEART CATH AND CORONARY ANGIOGRAPHY;  Surgeon: Belva Crome, MD;  Location: [facility];  Service: Cardiovascular;  Laterality: N/A;  . LUMBAR LAMINECTOMY/DECOMPRESSION MICRODISCECTOMY  08/05/2011   Procedure: LUMBAR LAMINECTOMY/DECOMPRESSION MICRODISCECTOMY;  Surgeon: Otilio Connors, MD;  Location: [facility];  Service: Neurosurgery;  Laterality: Right;  Right Lumbar four-five extraforaminal discectomy  . TONSILLECTOMY     as a child    ROS: Review of Systems Neg except as above  PHYSICAL EXAM: BP 123/88   Pulse 86   Temp 99.1 F (37.3 C) (Oral)   Resp 16   Ht 5' 5.5" (1.664 m)   Wt 119 lb 9.6 oz (54.3 kg)   SpO2 92%   BMI 19.60 kg/m   Wt Readings from Last 3 Encounters:  09/18/17 119 lb 9.6 oz (54.3 kg)  09/15/17 126 lb (57.2 kg)  09/05/17 126 lb (57.2 kg)    Physical Exam  General appearance - alert, well appearing, and in no distress Mental status - pt very tearful when taking about her mother and the current stress she feels in being her sole care giver Mouth - mucous membranes moist, pharynx normal without lesions Neck - supple, no significant adenopathy Chest - mild diffuse wheezing Heart - normal rate, regular rhythm, normal S1, S2, no murmurs, rubs, clicks or gallops  Abdomen - soft, nontender, nondistended, no masses or organomegaly Rectal -no external lesions.  No masses palpated within the rectal vault.  Stools are dark brown.  Stools are heme  positive. Extremities -no LE edema  Results for orders placed or performed in visit on 09/18/17  CBC  Result Value Ref Range   WBC 13.4 (H) 3.4 - 10.8 x10E3/uL   RBC 4.83 3.77 - 5.28 x10E6/uL   Hemoglobin 14.4 11.1 - 15.9 g/dL   Hematocrit 43.2 34.0 - 46.6 %   MCV 89 79 - 97 fL   MCH 29.8 26.6 - 33.0 pg   MCHC 33.3 31.5 - 35.7 g/dL   RDW 14.7 12.3 - 15.4 %   Platelets 450 (H) 150 - 379 x10E3/uL  Comprehensive metabolic panel  Result Value Ref Range   Glucose 140 (H) 65 - 99 mg/dL   BUN 23 8 - 27 mg/dL   Creatinine, Ser 0.84 0.57 - 1.00 mg/dL   GFR calc non Af Amer 75 >59 mL/min/1.73   GFR calc Af Amer 87 >59 mL/min/1.73   BUN/Creatinine Ratio 27 12 - 28   Sodium 139 134 - 144 mmol/L   Potassium 4.6 3.5 - 5.2 mmol/L   Chloride 97 96 - 106 mmol/L   CO2 27 20 - 29 mmol/L   Calcium 9.4 8.7 - 10.3 mg/dL   Total Protein 6.7 6.0 - 8.5 g/dL   Albumin 4.2 3.6 - 4.8 g/dL   Globulin, Total 2.5 1.5 - 4.5 g/dL   Albumin/Globulin Ratio 1.7 1.2 - 2.2   Bilirubin Total 0.2 0.0 - 1.2 mg/dL   Alkaline Phosphatase 86 39 - 117 IU/L   AST 22 0 - 40 IU/L   ALT 27 0 - 32 IU/L  TSH  Result Value Ref Range   TSH 0.317 (L) 0.450 - 4.500 uIU/mL  HIV antibody  Result Value Ref Range   HIV Screen 4th Generation wRfx Non Reactive Non Reactive    ASSESSMENT AND PLAN: 1. Pulmonary emphysema, unspecified emphysema type (HCC) -continue Prednisone taper -Spiriva changed to Incruse which she can get more readily through our PAP Continue to encourage smoking cessation.  RF Nicotine patches  2. Depression, unspecified depression type Increase Lexapro to 15 mg daily LCSW to meet with pt today - escitalopram (LEXAPRO) 10 MG tablet; Take 1.5 tablets (15 mg total) by mouth daily.  Dispense: 45 tablet; Refill: 6 - Ambulatory referral to Psychiatry  3. Post-nasal drip See #5 below  4. Tobacco dependence - nicotine (NICODERM CQ - DOSED IN MG/24 HOURS) 21 mg/24hr patch; Place 1 patch (21 mg total) onto  the skin daily.  Dispense: 28 patch; Refill: 0  5. Seasonal allergies - fluticasone (FLONASE) 50 MCG/ACT nasal spray; Place 2 sprays into both nostrils daily.  Dispense: 16 g; Refill: 6 - loratadine (CLARITIN) 10 MG tablet; Take 1 tablet (10 mg total) by mouth daily.  Dispense: 30 tablet; Refill: 11  6. Weight loss -Depression and stress may be contributing here a lot.   Check TSH to see if dose needs adjusting -will get her up to date with age appropriate screens - CBC - Comprehensive metabolic panel - TSH - HIV antibody  7. Acquired hypothyroidism - TSH  8. Black stool Refer to GI.  She is up to date with colonoscopy but may need EGD.  Avoid NSAIDs.  Continue Omeprazole. - CBC  9. Breast cancer screening - MM Digital Screening; Future  Addendum:  TSH results reviewed.  Will have CMA call and advise  decrease dose to 75 mcg dail Patient was given the opportunity to ask questions.  Patient verbalized understanding of the plan and was able to repeat key elements of the plan.   Orders Placed This Encounter  Procedures  . MM Digital Screening  . CBC  . Comprehensive metabolic panel  . TSH  . HIV antibody  . Ambulatory referral to Psychiatry     Requested Prescriptions   Signed Prescriptions Disp Refills  . fluticasone (FLONASE) 50 MCG/ACT nasal spray 16 g 6    Sig: Place 2 sprays into both nostrils daily.  Marland Kitchen loratadine (CLARITIN) 10 MG tablet 30 tablet 11    Sig: Take 1 tablet (10 mg total) by mouth daily.  . nicotine (NICODERM CQ - DOSED IN MG/24 HOURS) 21 mg/24hr patch 28 patch 0    Sig: Place 1 patch (21 mg total) onto the skin daily.  Marland Kitchen escitalopram (LEXAPRO) 10 MG tablet 45 tablet 6    Sig: Take 1.5 tablets (15 mg total) by mouth daily.  Marland Kitchen umeclidinium bromide (INCRUSE ELLIPTA) 62.5 MCG/INH AEPB 1 each 6    Sig: Inhale 1 puff into the lungs daily.    Return in about 6 weeks (around 10/30/2017).  Karle Plumber, MD, FACP

## 2017-09-18 NOTE — BH Specialist Note (Signed)
Integrated Behavioral Health Initial Visit  MRN: [id number] Name: [name] [name]  Number of [facility] Clinician visits:: 1/6 Session Start time: 3:10 PM  Session End time: 3:40 PM Total time: 30 minutes  Type of Service: [place] Interpretor:No. Interpretor Name and Language: N/A   Warm Hand Off Completed.       SUBJECTIVE: [name] [name] is a 62 y.o. female accompanied by self Patient was referred by Dr. Wynetta Emery for depression and anxiety. Patient reports the following symptoms/concerns: overwhelming feelings of sadness and worry, dfificulty sleeping, decreased concentration, increased stress, racing thoughts, panic attacks, and irritability Duration of problem: Ongoing; Severity of problem: moderate  OBJECTIVE: Mood: Anxious and Depressed and Affect: Tearful Risk of harm to self or others: No plan to harm self or others  LIFE CONTEXT: Family and Social: Pt resides with elderly mother. She receives limited support from adult daughter School/Work: Pt does not have income. She was denied disability Self-Care: Pt attends participates in medication management and a recovery support group Life Changes: Pt has ongoing medical conditions and is the primary caregiver for elderly mother diagnosed with alzheimers  GOALS ADDRESSED: Patient will: 1. Reduce symptoms of: anxiety and depression 2. Increase knowledge and/or ability of: coping skills and healthy habits  3. Demonstrate ability to: Increase healthy adjustment to current life circumstances and Increase adequate support systems for patient/family  INTERVENTIONS: Interventions utilized: Mindfulness or Psychologist, educational, Supportive Counseling, Psychoeducation and/or Health Education and Link to [employer]  Standardized Assessments completed: GAD-7 and PHQ 2&9  ASSESSMENT: Patient currently experiencing depression and anxiety triggered by ongoing medical  conditions and caregiver stress. She reports overwhelming feelings of sadness and worry, dfificulty sleeping, decreased concentration, increased stress, racing thoughts, panic attacks, and irritability. Pt receives limited support.   Patient participates in medication management and is open to referral to psychiatry. She attends a recovery support group ([facility]) weekly. LCSWA educated pt on therapeutic strategies to promote mindfulness and relaxation. Resources on caregiver support groups that offer respite care, PACE, and SCAT were provided. LCSWA referred pt to Legal Aid to assist with appeal for disability.  PLAN: 1. Follow up with behavioral health clinician on : Pt was encouraged to contact LCSWA if symptoms worsen or fail to improve to schedule behavioral appointments at [facility]. 2. Behavioral recommendations: LCSWA recommends that pt apply healthy coping skills discussed, comply with medication management, and utilize provided resources. Pt is encouraged to schedule follow up appointment with LCSWA 3. Referral(s): Armed forces logistics/support/administrative officer (LME/Outside Clinic) and Community Resources:  Transportation and PACE, Legal Aid 4. "From scale of 1-10, how likely are you to follow plan?": 9/10  Rebekah Chesterfield, LCSW 09/19/17 4:09 PM

## 2017-09-18 NOTE — Patient Instructions (Addendum)
Increase Escitalopram to 15 mg daily.  You have been referred to psychiatry.  Add Spiriva inhaler. Continue to work on quitting smoking.   Start Flonase nasal spray.

## 2017-09-19 ENCOUNTER — Ambulatory Visit: Payer: Self-pay

## 2017-09-19 LAB — COMPREHENSIVE METABOLIC PANEL
ALK PHOS: 86 IU/L (ref 39–117)
ALT: 27 IU/L (ref 0–32)
AST: 22 IU/L (ref 0–40)
Albumin/Globulin Ratio: 1.7 (ref 1.2–2.2)
Albumin: 4.2 g/dL (ref 3.6–4.8)
BUN/Creatinine Ratio: 27 (ref 12–28)
BUN: 23 mg/dL (ref 8–27)
Bilirubin Total: 0.2 mg/dL (ref 0.0–1.2)
CO2: 27 mmol/L (ref 20–29)
CREATININE: 0.84 mg/dL (ref 0.57–1.00)
Calcium: 9.4 mg/dL (ref 8.7–10.3)
Chloride: 97 mmol/L (ref 96–106)
GFR calc Af Amer: 87 mL/min/{1.73_m2} (ref >59)
GFR calc non Af Amer: 75 mL/min/{1.73_m2} (ref >59)
GLUCOSE: 140 mg/dL — AB (ref 65–99)
Globulin, Total: 2.5 g/dL (ref 1.5–4.5)
Potassium: 4.6 mmol/L (ref 3.5–5.2)
Sodium: 139 mmol/L (ref 134–144)
Total Protein: 6.7 g/dL (ref 6.0–8.5)

## 2017-09-19 LAB — CBC
HEMOGLOBIN: 14.4 g/dL (ref 11.1–15.9)
Hematocrit: 43.2 % (ref 34.0–46.6)
MCH: 29.8 pg (ref 26.6–33.0)
MCHC: 33.3 g/dL (ref 31.5–35.7)
MCV: 89 fL (ref 79–97)
Platelets: 450 10*3/uL — ABNORMAL HIGH (ref 150–379)
RBC: 4.83 x10E6/uL (ref 3.77–5.28)
RDW: 14.7 % (ref 12.3–15.4)
WBC: 13.4 10*3/uL — AB (ref 3.4–10.8)

## 2017-09-19 LAB — TSH: TSH: 0.317 u[IU]/mL — AB (ref 0.450–4.500)

## 2017-09-19 LAB — HIV ANTIBODY (ROUTINE TESTING W REFLEX): HIV SCREEN 4TH GENERATION: NONREACTIVE

## 2017-09-21 ENCOUNTER — Other Ambulatory Visit: Payer: Self-pay | Admitting: Internal Medicine

## 2017-09-21 DIAGNOSIS — E039 Hypothyroidism, unspecified: Secondary | ICD-10-CM

## 2017-09-21 MED ORDER — LEVOTHYROXINE SODIUM 75 MCG PO TABS: 75.0000 ug | ORAL_TABLET | Freq: Every day | ORAL | 6 refills | Status: DC

## 2017-09-22 MED FILL — ?LEVOTHYROXINE 75 MCG TABLE: 75 | 30 days supply | Qty: 30 | Fill #0

## 2017-09-23 ENCOUNTER — Telehealth: Payer: Self-pay

## 2017-09-23 NOTE — Telephone Encounter (Signed)
Contacted pt to go over lab results pt didn't answer and was unable to lvm  

## 2017-09-24 ENCOUNTER — Other Ambulatory Visit: Payer: Self-pay | Admitting: Critical Care Medicine

## 2017-09-24 DIAGNOSIS — G8929 Other chronic pain: Secondary | ICD-10-CM

## 2017-09-24 DIAGNOSIS — M545 Low back pain: Principal | ICD-10-CM

## 2017-09-24 MED FILL — HYDROXYZINE PAM 25 MG CAP: 25 | 24 days supply | Qty: 73 | Fill #2

## 2017-09-24 MED FILL — CYCLOBENZAPRINE 10 MG TAB: 10 | 10 days supply | Qty: 30 | Fill #2

## 2017-09-24 MED FILL — IPRAT-ALBUT 0.5-3(2.5) MG/3: 0.5-2.5 (3) | 14 days supply | Qty: 180 | Fill #2

## 2017-09-24 MED FILL — $BREO ELLIPTA 200-25 MCG IN: 200-25 | 90 days supply | Qty: 180 | Fill #2

## 2017-09-24 MED FILL — GABAPENTIN 600 MG TABLET: 600 | 30 days supply | Qty: 120 | Fill #2

## 2017-09-24 MED FILL — !BRILINTA 90 MG TABLET: 30 days supply | Qty: 60 | Fill #9

## 2017-10-01 MED FILL — ?ATORVASTATIN 40MG TABLET: 40 | 30 days supply | Qty: 30 | Fill #3

## 2017-10-01 MED FILL — ?MONTELUKAST SOD 10 MG TAB: 10 | 30 days supply | Qty: 30 | Fill #1

## 2017-10-01 MED FILL — ?FUROSEMIDE 20 MG TABLET: 20 | 30 days supply | Qty: 30 | Fill #2

## 2017-10-01 MED FILL — LOSARTAN POTASSIUM 50 MG TA: 50 | 30 days supply | Qty: 45 | Fill #2

## 2017-10-03 ENCOUNTER — Ambulatory Visit: Payer: Self-pay | Attending: Internal Medicine

## 2017-10-03 ENCOUNTER — Telehealth: Payer: Self-pay | Admitting: Internal Medicine

## 2017-10-03 NOTE — Telephone Encounter (Signed)
Patient dropped off scat application 9.70.26 .

## 2017-10-04 ENCOUNTER — Ambulatory Visit (INDEPENDENT_AMBULATORY_CARE_PROVIDER_SITE_OTHER): Payer: No Typology Code available for payment source | Admitting: Psychiatry

## 2017-10-04 ENCOUNTER — Encounter (HOSPITAL_COMMUNITY): Payer: Self-pay | Admitting: Psychiatry

## 2017-10-04 VITALS — BP 140/88 | HR 80 | Ht 61.5 in | Wt 124.0 lb

## 2017-10-04 DIAGNOSIS — K219 Gastro-esophageal reflux disease without esophagitis: Secondary | ICD-10-CM

## 2017-10-04 DIAGNOSIS — F319 Bipolar disorder, unspecified: Secondary | ICD-10-CM

## 2017-10-04 DIAGNOSIS — G8929 Other chronic pain: Secondary | ICD-10-CM

## 2017-10-04 DIAGNOSIS — F4310 Post-traumatic stress disorder, unspecified: Secondary | ICD-10-CM

## 2017-10-04 DIAGNOSIS — F17210 Nicotine dependence, cigarettes, uncomplicated: Secondary | ICD-10-CM

## 2017-10-04 DIAGNOSIS — I2510 Atherosclerotic heart disease of native coronary artery without angina pectoris: Secondary | ICD-10-CM

## 2017-10-04 DIAGNOSIS — G4700 Insomnia, unspecified: Secondary | ICD-10-CM

## 2017-10-04 DIAGNOSIS — Z560 Unemployment, unspecified: Secondary | ICD-10-CM

## 2017-10-04 DIAGNOSIS — M549 Dorsalgia, unspecified: Secondary | ICD-10-CM

## 2017-10-04 DIAGNOSIS — J449 Chronic obstructive pulmonary disease, unspecified: Secondary | ICD-10-CM

## 2017-10-04 DIAGNOSIS — I10 Essential (primary) hypertension: Secondary | ICD-10-CM

## 2017-10-04 MED ORDER — DIVALPROEX SODIUM ER 250 MG PO TB24: 250.0000 mg | ORAL_TABLET | Freq: Two times a day (BID) | ORAL | 0 refills | Status: DC
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
## 2017-10-15 ENCOUNTER — Telehealth: Payer: Self-pay

## 2017-10-15 NOTE — Telephone Encounter (Signed)
As per Abelino Derrick, [employer] [facility], they have been exchanging phone calls with the patient but have not been able to reach her to discuss her concerns about SSI

## 2017-10-21 ENCOUNTER — Other Ambulatory Visit: Payer: Self-pay | Admitting: Internal Medicine

## 2017-10-22 ENCOUNTER — Other Ambulatory Visit: Payer: Self-pay | Admitting: Internal Medicine

## 2017-10-22 DIAGNOSIS — Z1239 Encounter for other screening for malignant neoplasm of breast: Secondary | ICD-10-CM

## 2017-10-22 DIAGNOSIS — L309 Dermatitis, unspecified: Secondary | ICD-10-CM

## 2017-10-22 MED FILL — LEVOTHYROXINE 100 MCG TAB: 100 | 30 days supply | Qty: 30 | Fill #3

## 2017-10-22 MED FILL — !BRILINTA 90 MG TABLET: 30 days supply | Qty: 60 | Fill #10

## 2017-10-22 MED FILL — ESCITALOPRAM 10 MG TABLET: 10 | 30 days supply | Qty: 45 | Fill #1

## 2017-10-23 MED FILL — TRIAMCINOLONE ACETONIDE 0.1: 0.1 | 10 days supply | Qty: 30 | Fill #0

## 2017-10-28 ENCOUNTER — Ambulatory Visit: Payer: Self-pay | Attending: Internal Medicine | Admitting: Internal Medicine

## 2017-10-28 ENCOUNTER — Encounter: Payer: Self-pay | Admitting: Internal Medicine

## 2017-10-28 VITALS — BP 146/88 | HR 86 | Temp 98.8°F | Resp 12 | Wt 123.8 lb

## 2017-10-28 DIAGNOSIS — G4700 Insomnia, unspecified: Secondary | ICD-10-CM

## 2017-10-28 DIAGNOSIS — Z823 Family history of stroke: Secondary | ICD-10-CM | POA: Insufficient documentation

## 2017-10-28 DIAGNOSIS — E785 Hyperlipidemia, unspecified: Secondary | ICD-10-CM | POA: Insufficient documentation

## 2017-10-28 DIAGNOSIS — Z91040 Latex allergy status: Secondary | ICD-10-CM | POA: Insufficient documentation

## 2017-10-28 DIAGNOSIS — J439 Emphysema, unspecified: Secondary | ICD-10-CM

## 2017-10-28 DIAGNOSIS — F17200 Nicotine dependence, unspecified, uncomplicated: Secondary | ICD-10-CM

## 2017-10-28 DIAGNOSIS — Z833 Family history of diabetes mellitus: Secondary | ICD-10-CM | POA: Insufficient documentation

## 2017-10-28 DIAGNOSIS — F17210 Nicotine dependence, cigarettes, uncomplicated: Secondary | ICD-10-CM | POA: Insufficient documentation

## 2017-10-28 DIAGNOSIS — Z803 Family history of malignant neoplasm of breast: Secondary | ICD-10-CM | POA: Insufficient documentation

## 2017-10-28 DIAGNOSIS — I2510 Atherosclerotic heart disease of native coronary artery without angina pectoris: Secondary | ICD-10-CM | POA: Insufficient documentation

## 2017-10-28 DIAGNOSIS — Z91018 Allergy to other foods: Secondary | ICD-10-CM | POA: Insufficient documentation

## 2017-10-28 DIAGNOSIS — Z818 Family history of other mental and behavioral disorders: Secondary | ICD-10-CM | POA: Insufficient documentation

## 2017-10-28 DIAGNOSIS — Z800 Family history of malignant neoplasm of digestive organs: Secondary | ICD-10-CM | POA: Insufficient documentation

## 2017-10-28 DIAGNOSIS — F329 Major depressive disorder, single episode, unspecified: Secondary | ICD-10-CM

## 2017-10-28 DIAGNOSIS — F419 Anxiety disorder, unspecified: Secondary | ICD-10-CM | POA: Insufficient documentation

## 2017-10-28 DIAGNOSIS — Z9889 Other specified postprocedural states: Secondary | ICD-10-CM | POA: Insufficient documentation

## 2017-10-28 DIAGNOSIS — E039 Hypothyroidism, unspecified: Secondary | ICD-10-CM

## 2017-10-28 DIAGNOSIS — Z8249 Family history of ischemic heart disease and other diseases of the circulatory system: Secondary | ICD-10-CM | POA: Insufficient documentation

## 2017-10-28 DIAGNOSIS — Z79899 Other long term (current) drug therapy: Secondary | ICD-10-CM | POA: Insufficient documentation

## 2017-10-28 DIAGNOSIS — Z885 Allergy status to narcotic agent status: Secondary | ICD-10-CM | POA: Insufficient documentation

## 2017-10-28 DIAGNOSIS — F32A Depression, unspecified: Secondary | ICD-10-CM

## 2017-10-28 DIAGNOSIS — I10 Essential (primary) hypertension: Secondary | ICD-10-CM | POA: Insufficient documentation

## 2017-10-28 DIAGNOSIS — Z7982 Long term (current) use of aspirin: Secondary | ICD-10-CM | POA: Insufficient documentation

## 2017-10-28 DIAGNOSIS — E118 Type 2 diabetes mellitus with unspecified complications: Secondary | ICD-10-CM

## 2017-10-28 DIAGNOSIS — K219 Gastro-esophageal reflux disease without esophagitis: Secondary | ICD-10-CM

## 2017-10-28 DIAGNOSIS — Z955 Presence of coronary angioplasty implant and graft: Secondary | ICD-10-CM | POA: Insufficient documentation

## 2017-10-28 DIAGNOSIS — E119 Type 2 diabetes mellitus without complications: Secondary | ICD-10-CM | POA: Insufficient documentation

## 2017-10-28 DIAGNOSIS — R7303 Prediabetes: Secondary | ICD-10-CM

## 2017-10-28 DIAGNOSIS — F0390 Unspecified dementia without behavioral disturbance: Secondary | ICD-10-CM | POA: Insufficient documentation

## 2017-10-28 LAB — GLUCOSE, POCT (MANUAL RESULT ENTRY): POC GLUCOSE: 113 mg/dL — AB (ref 70–99)

## 2017-10-28 LAB — POCT GLYCOSYLATED HEMOGLOBIN (HGB A1C): Hemoglobin A1C: 5.9

## 2017-10-28 MED ORDER — ZOLPIDEM TARTRATE 5 MG PO TABS: 5.0000 mg | ORAL_TABLET | Freq: Every evening | ORAL | 0 refills | Status: DC | PRN

## 2017-10-28 MED ORDER — TICAGRELOR 90 MG PO TABS: 90.0000 mg | ORAL_TABLET | Freq: Two times a day (BID) | ORAL | 6 refills | Status: DC

## 2017-10-28 MED ORDER — LABETALOL HCL 100 MG PO TABS: 100.0000 mg | ORAL_TABLET | Freq: Two times a day (BID) | ORAL | 6 refills | Status: DC

## 2017-10-28 MED ORDER — OMEPRAZOLE 40 MG PO CPDR: 40.0000 mg | DELAYED_RELEASE_CAPSULE | Freq: Every day | ORAL | 3 refills | Status: DC

## 2017-10-28 MED FILL — LABETALOL HCL 100 MG TABS: 100 | 30 days supply | Qty: 60 | Fill #3

## 2017-10-28 MED FILL — ?MONTELUKAST SOD 10 MG TAB: 10 | 30 days supply | Qty: 30 | Fill #2

## 2017-10-28 MED FILL — ?FUROSEMIDE 20 MG TABLET: 20 | 30 days supply | Qty: 30 | Fill #3

## 2017-10-28 NOTE — Progress Notes (Signed)
Patient ID: [name] [name], female    DOB: 11-Jul-1956  MRN: [id number]  CC: Follow-up   Subjective: [name] [name] is a 62 y.o. female who presents for f/u wgh loss.  Anise Salvo, is with her.  Her concerns today include:  Patient with history ofpre-DM, GERD, hypothyroidism, HTN, HL, CAD with previous stent, COPD, substance abuse and tobacco dependence  1.  Depression:  Doing better but still stressed about caring for her mom who has dementia and is on hospice.  She has seen psychiatrist Dr. Adele Schilder and will also be seeing a therapist.  Dx with bipolar 1.  Started on Depakote.  Thinks she has ADHD but she has problems completing tasks.  She forgot to discuss that with the psychiatrist.   Request something to help with sleep. She is on Melatonin and  Hydroxyzine but the latter is now on back order.  Reports trying Trazodone in past but it caused knees to ache.  2.  Referred to GI on last visit for report of black stools.  No appointment has yet.  Her hemoglobin and hematocrit were stable.  She is not on any NSAIDs.  She is taking omeprazole.  3. Needs RF on Incruse inhaler  5. htn: Blood pressure elevated today.  She has not taken Cozaar and labetalol as yet for today. Patient Active Problem List   Diagnosis Date Noted  . COPD with acute exacerbation ([place]) 09/15/2017  . Pre-diabetes 05/28/2017  . Emesis, persistent 05/28/2017  . QT prolongation 03/25/2017  . Encounter for screening mammogram for breast cancer 09/11/2015  . Psoriasis 06/22/2015  . COPD (chronic obstructive pulmonary disease) ([place]) 05/18/2015  . Anxiety and depression 07/10/2013  . Essential hypertension, benign 06/07/2013  . Dyslipidemia 06/07/2013  . Asthma, chronic 06/07/2013  . Emphysema lung ([place]) 06/07/2013  . Chronic back pain 06/07/2013  . CAD S/P percutaneous coronary angioplasty 06/07/2013  . GERD (gastroesophageal reflux disease) 06/07/2013  . Breast lump on left side at 3 o'clock position 10/13/2012  .  S/P abdominal hysterectomy and right salpingo-oophorectomy 08/29/2011  . COPD exacerbation ([place]) 09/15/2009  . TOBACCO ABUSE 07/17/2009  . Chronic rhinitis 07/17/2009  . Lung nodule < 6cm on CT 07/17/2009  . ALLERGY, FOOD 07/17/2009  . Hypothyroidism 12/22/2008     Current Outpatient Medications on File Prior to Visit  Medication Sig Dispense Refill  . albuterol (VENTOLIN HFA) 108 (90 Base) MCG/ACT inhaler Inhale 2 puffs every 6 (six) hours as needed into the lungs for wheezing or shortness of breath. 18 g 2  . aspirin EC 81 MG tablet Take 81 mg by mouth daily.    Marland Kitchen atorvastatin (LIPITOR) 40 MG tablet Take 1 tablet (40 mg total) daily by mouth. (Patient taking differently: Take 40 mg by mouth at bedtime. ) 90 tablet 3  . Calcium 600 MG tablet Take 600 mg by mouth daily.  60 tablet   . cyclobenzaprine (FLEXERIL) 10 MG tablet Take 1 tablet (10 mg total) by mouth 3 (three) times daily as needed. (Patient taking differently: Take 10 mg by mouth 3 (three) times daily as needed for muscle spasms. ) 30 tablet 2  . divalproex (DEPAKOTE ER) 250 MG 24 hr tablet Take 1 tablet (250 mg total) by mouth 2 (two) times daily. 60 tablet 0  . escitalopram (LEXAPRO) 10 MG tablet Take 1.5 tablets (15 mg total) by mouth daily. 45 tablet 6  . fluticasone (FLONASE) 50 MCG/ACT nasal spray Place 2 sprays into both nostrils daily. 16 g 6  .  fluticasone furoate-vilanterol (BREO ELLIPTA) 200-25 MCG/INH AEPB Inhale 1 puff daily into the lungs. 60 each 3  . furosemide (LASIX) 20 MG tablet Take 1 tablet (20 mg total) by mouth daily. 30 tablet 3  . gabapentin (NEURONTIN) 600 MG tablet TAKE 1 TABLET BY MOUTH 4 TIMES DAILY (Patient taking differently: Take 600 mg by mouth 4 (four) times daily. ) 120 tablet 3  . ipratropium-albuterol (DUONEB) 0.5-2.5 (3) MG/3ML SOLN Take 3 mLs every 6 (six) hours as needed by nebulization. (Patient taking differently: Take 3 mLs by nebulization every 6 (six) hours as needed (wheezing). ) 360 mL 3   . levothyroxine (SYNTHROID, LEVOTHROID) 75 MCG tablet Take 1 tablet (75 mcg total) by mouth daily. 30 tablet 6  . loratadine (CLARITIN) 10 MG tablet Take 1 tablet (10 mg total) by mouth daily. 30 tablet 11  . losartan (COZAAR) 50 MG tablet Take 1.5 tablets (75 mg total) by mouth daily. (Patient taking differently: Take 75 mg by mouth at bedtime. ) 45 tablet 6  . Melatonin 10 MG SUBL Place 20 mg under the tongue at bedtime.    . montelukast (SINGULAIR) 10 MG tablet Take 1 tablet (10 mg total) at bedtime by mouth. 30 tablet 3  . naproxen sodium (ALEVE) 220 MG tablet Take 440 mg by mouth 2 (two) times daily.    . nicotine (NICODERM CQ - DOSED IN MG/24 HOURS) 21 mg/24hr patch Place 1 patch (21 mg total) onto the skin daily. 28 patch 0  . umeclidinium bromide (INCRUSE ELLIPTA) 62.5 MCG/INH AEPB Inhale 1 puff into the lungs daily. 1 each 6  . Blood Glucose Monitoring Suppl (TRUE METRIX METER) DEVI 1 kit by Does not apply route 4 (four) times daily. 1 Device 0  . glucose blood (TRUE METRIX BLOOD GLUCOSE TEST) test strip Use as instructed 100 each 12  . hydrOXYzine (VISTARIL) 25 MG capsule TAKE 25MG BY MOUTH THREE TIMES DAILY (Patient not taking: Reported on 10/28/2017) 90 capsule 3  . ondansetron (ZOFRAN) 4 MG tablet Take 1 tablet (4 mg total) every 8 (eight) hours as needed by mouth for nausea or vomiting. (Patient not taking: Reported on 10/28/2017) 20 tablet 0  . triamcinolone cream (KENALOG) 0.1 % APPLY 1 APPLICATION TOPICALLY 2 TIMES DAILY. 30 g 0  . TRUEPLUS LANCETS 28G MISC 28 g by Does not apply route QID. 120 each 2   No current facility-administered medications on file prior to visit.     Allergies  Allergen Reactions  . Avocado Anaphylaxis  . Latex Shortness Of Breath and Rash  . Codeine Nausea Only    Social History   Socioeconomic History  . Marital status: Divorced    Spouse name: Not on file  . Number of children: Not on file  . Years of education: Not on file  . Highest  education level: Not on file  Occupational History  . Not on file  Social Needs  . Financial resource strain: Not on file  . Food insecurity:    Worry: Not on file    Inability: Not on file  . Transportation needs:    Medical: Not on file    Non-medical: Not on file  Tobacco Use  . Smoking status: Current Every Day Smoker    Packs/day: 0.50    Years: 49.00    Pack years: 24.50    Types: Cigarettes  . Smokeless tobacco: Never Used  Substance and Sexual Activity  . Alcohol use: Not Currently  . Drug use: No  Types: Cocaine    Comment: 03/25/2017 "used cocaine a couple days ago; smoke pot from time to time"  . Sexual activity: Not Currently    Birth control/protection: Surgical  Lifestyle  . Physical activity:    Days per week: Not on file    Minutes per session: Not on file  . Stress: Not on file  Relationships  . Social connections:    Talks on phone: Not on file    Gets together: Not on file    Attends religious service: Not on file    Active member of club or organization: Not on file    Attends meetings of clubs or organizations: Not on file    Relationship status: Not on file  . Intimate partner violence:    Fear of current or ex partner: Not on file    Emotionally abused: Not on file    Physically abused: Not on file    Forced sexual activity: Not on file  Other Topics Concern  . Not on file  Social History Narrative  . Not on file    Family History  Problem Relation Age of Onset  . Heart disease Mother   . Hypertension Mother   . Stroke Mother   . Mental illness Mother   . Heart disease Father   . Hypertension Father   . Diabetes Father   . Cancer Maternal Aunt        breast  . Thyroid disease Paternal Aunt   . Heart disease Maternal Grandfather   . Anesthesia problems Daughter   . Cancer Paternal Grandmother        mouth  . Hypotension Neg Hx   . Malignant hyperthermia Neg Hx   . Pseudochol deficiency Neg Hx     Past Surgical History:    Procedure Laterality Date  . ABDOMINAL EXPLORATION SURGERY  1977  . ABDOMINAL HYSTERECTOMY  1977   "left one of my ovaries"  . BACK SURGERY    . COLONOSCOPY    . CORONARY ANGIOPLASTY WITH STENT PLACEMENT  2011 X2   "regular stents didn't work; had to go back in in ~ 1 month and put in medicated stents"  . DILATION AND CURETTAGE OF UTERUS    . ESOPHAGOGASTRODUODENOSCOPY    . LEFT HEART CATH AND CORONARY ANGIOGRAPHY N/A 03/26/2017   Procedure: LEFT HEART CATH AND CORONARY ANGIOGRAPHY;  Surgeon: Belva Crome, MD;  Location: [facility];  Service: Cardiovascular;  Laterality: N/A;  . LUMBAR LAMINECTOMY/DECOMPRESSION MICRODISCECTOMY  08/05/2011   Procedure: LUMBAR LAMINECTOMY/DECOMPRESSION MICRODISCECTOMY;  Surgeon: Otilio Connors, MD;  Location: [facility];  Service: Neurosurgery;  Laterality: Right;  Right Lumbar four-five extraforaminal discectomy  . TONSILLECTOMY     as a child    ROS: Review of Systems Gen: Complains of weight loss on last visit.  TSH was found to be suppressed.  Levothyroxine dose was decreased from 100 mg to 75 mg daily.  Gained 4 lbs since last visit. PHYSICAL EXAM: BP (!) 146/88 (BP Location: Right Arm, Patient Position: Sitting, Cuff Size: Normal)   Pulse 86   Temp 98.8 F (37.1 C) (Oral)   Resp 12   Wt 123 lb 12.8 oz (56.2 kg)   SpO2 98%   BMI 23.01 kg/m   Wt Readings from Last 3 Encounters:  10/28/17 123 lb 12.8 oz (56.2 kg)  10/04/17 124 lb (56.2 kg)  09/18/17 119 lb 9.6 oz (54.3 kg)    Physical Exam  General appearance - alert,  well appearing, and in no distress Neck - supple, no significant adenopathy Chest - clear to auscultation, no wheezes, rales or rhonchi, symmetric air entry Heart - normal rate, regular rhythm, normal S1, S2, no murmurs, rubs, clicks or gallops  Results for orders placed or performed in visit on 10/28/17  Glucose (CBG)  Result Value Ref Range   POC Glucose 113 (A) 70 - 99 mg/dl  HgB A1c  Result Value Ref  Range   Hemoglobin A1C 5.9     ASSESSMENT AND PLAN: 1. Depression, unspecified depression type -She has establish care with psychiatry.  Depakote added as a mood stabilizer. Diagnosis of bipolar 1.  2. Insomnia, unspecified type -I have given a limited supply of 15 tablets of Ambien to use as needed (not for long term maintenance) until hydroxyzine is back in stock.  Good sleep hygiene encouraged.  3. Hypothyroidism, unspecified type Cont Levothyroxine at lower dose  4. Prediabetes Cont to encourage healthy eating habits - Glucose (CBG) - HgB A1c  5. TOBACCO ABUSE Continue to encourage smoking cessation.  Patient not ready to quit.  She feels she is on the too much stress in dealing with her mother.  6. Pulmonary emphysema, unspecified emphysema type ([facility]) Refill incruse inhaler.  7. Gastroesophageal reflux disease, esophagitis presence not specified  - omeprazole (PRILOSEC) 40 MG capsule; Take 1 capsule (40 mg total) by mouth daily.  Dispense: 30 capsule; Refill: 3  Patient was given the opportunity to ask questions.  Patient verbalized understanding of the plan and was able to repeat key elements of the plan.   Orders Placed This Encounter  Procedures  . Glucose (CBG)  . HgB A1c     Requested Prescriptions   Signed Prescriptions Disp Refills  . ticagrelor (BRILINTA) 90 MG TABS tablet 60 tablet 6    Sig: Take 1 tablet (90 mg total) by mouth 2 (two) times daily.  Marland Kitchen labetalol (NORMODYNE) 100 MG tablet 60 tablet 6    Sig: Take 1 tablet (100 mg total) by mouth 2 (two) times daily.  Marland Kitchen zolpidem (AMBIEN) 5 MG tablet 15 tablet 0    Sig: Take 1 tablet (5 mg total) by mouth at bedtime as needed for sleep. Each prescription to last a month  . omeprazole (PRILOSEC) 40 MG capsule 30 capsule 3    Sig: Take 1 capsule (40 mg total) by mouth daily.    Return in about 3 months (around 01/27/2018).  Karle Plumber, MD, FACP

## 2017-10-28 NOTE — Progress Notes (Signed)
Not taking visteril, out of medication x 3 days. Unable to sleep . Request RF for zofran d/t nauseated feeling from post nasal drip  RF on medications: BP and brillinta

## 2017-10-29 ENCOUNTER — Ambulatory Visit (HOSPITAL_COMMUNITY): Payer: Self-pay | Admitting: Licensed Clinical Social Worker

## 2017-10-29 MED FILL — OMEPRAZOLE DR 40 MG CAPSULE: 40 | 30 days supply | Qty: 30 | Fill #0

## 2017-11-11 ENCOUNTER — Telehealth: Payer: Self-pay | Admitting: Internal Medicine

## 2017-11-11 NOTE — Telephone Encounter (Signed)
Call placed to patient [phone], regarding her [employer] application. Spoke with patient and she informed me that had assessment done but was denied service. Patient didn't understand why and was told that she had to wait until the summer for them to reach out to her again. Patient stated that she appealed the decision and is waiting on [employer] to call or send her a letter regarding that matter.

## 2017-11-15 ENCOUNTER — Ambulatory Visit (INDEPENDENT_AMBULATORY_CARE_PROVIDER_SITE_OTHER): Payer: No Typology Code available for payment source | Admitting: Psychiatry

## 2017-11-15 ENCOUNTER — Encounter (HOSPITAL_COMMUNITY): Payer: Self-pay | Admitting: Psychiatry

## 2017-11-15 ENCOUNTER — Other Ambulatory Visit: Payer: Self-pay

## 2017-11-15 ENCOUNTER — Encounter

## 2017-11-15 VITALS — BP 148/84 | HR 82 | Temp 98.2°F | Wt 121.2 lb

## 2017-11-15 DIAGNOSIS — Z91411 Personal history of adult psychological abuse: Secondary | ICD-10-CM

## 2017-11-15 DIAGNOSIS — Z91410 Personal history of adult physical and sexual abuse: Secondary | ICD-10-CM

## 2017-11-15 DIAGNOSIS — G4700 Insomnia, unspecified: Secondary | ICD-10-CM

## 2017-11-15 DIAGNOSIS — F411 Generalized anxiety disorder: Secondary | ICD-10-CM

## 2017-11-15 DIAGNOSIS — F17210 Nicotine dependence, cigarettes, uncomplicated: Secondary | ICD-10-CM

## 2017-11-15 DIAGNOSIS — Z818 Family history of other mental and behavioral disorders: Secondary | ICD-10-CM

## 2017-11-15 DIAGNOSIS — Z560 Unemployment, unspecified: Secondary | ICD-10-CM

## 2017-11-15 DIAGNOSIS — F319 Bipolar disorder, unspecified: Secondary | ICD-10-CM

## 2017-11-15 DIAGNOSIS — R450 Nervousness: Secondary | ICD-10-CM

## 2017-11-15 DIAGNOSIS — M2550 Pain in unspecified joint: Secondary | ICD-10-CM

## 2017-11-15 MED ORDER — BUSPIRONE HCL 5 MG PO TABS: 5.0000 mg | ORAL_TABLET | Freq: Two times a day (BID) | ORAL | 0 refills | Status: DC

## 2017-11-15 MED ORDER — QUETIAPINE FUMARATE 100 MG PO TABS: ORAL_TABLET | ORAL | 0 refills | Status: DC
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
## 2017-11-18 MED FILL — HYDROXYZINE PAM 25 MG CAP: 25 | 24 days supply | Qty: 73 | Fill #3

## 2017-11-18 MED FILL — LOSARTAN POTASSIUM 50 MG TA: 50 | 30 days supply | Qty: 45 | Fill #3

## 2017-11-18 MED FILL — $INCRUSE ELLIPTA 62.5 MCG I: 62.5 MCG | 90 days supply | Qty: 90 | Fill #1

## 2017-11-18 MED FILL — ?ATORVASTATIN 40MG TABLET: 40 | 30 days supply | Qty: 30 | Fill #4

## 2017-12-04 ENCOUNTER — Other Ambulatory Visit: Payer: Self-pay | Admitting: Physician Assistant

## 2017-12-04 DIAGNOSIS — I10 Essential (primary) hypertension: Secondary | ICD-10-CM

## 2017-12-05 MED FILL — ?FUROSEMIDE 20 MG TABLET: 20 | 30 days supply | Qty: 30 | Fill #0

## 2017-12-09 MED FILL — !BRILINTA 90 MG TABLET: 30 days supply | Qty: 60 | Fill #0

## 2017-12-09 MED FILL — GABAPENTIN 600 MG TABLET: 600 | 30 days supply | Qty: 120 | Fill #3

## 2017-12-12 MED FILL — ESCITALOPRAM 10 MG TABLET: 10 | 30 days supply | Qty: 45 | Fill #2

## 2017-12-17 ENCOUNTER — Other Ambulatory Visit: Payer: Self-pay | Admitting: Physician Assistant

## 2017-12-17 ENCOUNTER — Telehealth: Payer: Self-pay

## 2017-12-17 ENCOUNTER — Other Ambulatory Visit: Payer: Self-pay | Admitting: Internal Medicine

## 2017-12-17 ENCOUNTER — Other Ambulatory Visit (HOSPITAL_COMMUNITY): Payer: Self-pay

## 2017-12-17 DIAGNOSIS — L309 Dermatitis, unspecified: Secondary | ICD-10-CM

## 2017-12-17 DIAGNOSIS — F411 Generalized anxiety disorder: Secondary | ICD-10-CM

## 2017-12-17 MED ORDER — BUSPIRONE HCL 5 MG PO TABS: 5.0000 mg | ORAL_TABLET | Freq: Two times a day (BID) | ORAL | 0 refills | Status: DC

## 2017-12-17 MED FILL — TRIAMCINOLONE ACETONIDE 0.1: 0.1 | 10 days supply | Qty: 30 | Fill #0

## 2017-12-17 MED FILL — OMEPRAZOLE DR 40 MG CAPSULE: 40 | 30 days supply | Qty: 30 | Fill #1

## 2017-12-17 MED FILL — LABETALOL HCL 100 MG TABS: 100 | 30 days supply | Qty: 60 | Fill #0

## 2017-12-17 MED FILL — ?MONTELUKAST SODI 10MG TAB: 10 | 30 days supply | Qty: 30 | Fill #3

## 2017-12-17 MED FILL — CYCLOBENZAPRINE 10 MG TAB: 10 | 10 days supply | Qty: 30 | Fill #0

## 2017-12-17 MED FILL — ?ATORVASTATIN 40MG TABLET: 40 | 30 days supply | Qty: 30 | Fill #5

## 2017-12-17 MED FILL — LOSARTAN POTASSIUM 50 MG TA: 50 | 30 days supply | Qty: 45 | Fill #4

## 2017-12-17 NOTE — Telephone Encounter (Signed)
As per [name] [name], [employer] [facility], they sent a letter to the patient, no response from her , so the case is now closed.

## 2017-12-22 ENCOUNTER — Encounter

## 2017-12-22 ENCOUNTER — Ambulatory Visit (INDEPENDENT_AMBULATORY_CARE_PROVIDER_SITE_OTHER): Payer: No Typology Code available for payment source | Admitting: Licensed Clinical Social Worker

## 2017-12-22 ENCOUNTER — Encounter (HOSPITAL_COMMUNITY): Payer: Self-pay | Admitting: Licensed Clinical Social Worker

## 2017-12-22 DIAGNOSIS — Z62810 Personal history of physical and sexual abuse in childhood: Secondary | ICD-10-CM

## 2017-12-22 DIAGNOSIS — R450 Nervousness: Secondary | ICD-10-CM

## 2017-12-22 DIAGNOSIS — Z636 Dependent relative needing care at home: Secondary | ICD-10-CM

## 2017-12-22 DIAGNOSIS — Z62898 Other specified problems related to upbringing: Secondary | ICD-10-CM

## 2017-12-22 DIAGNOSIS — F319 Bipolar disorder, unspecified: Secondary | ICD-10-CM

## 2017-12-22 DIAGNOSIS — Z810 Family history of intellectual disabilities: Secondary | ICD-10-CM

## 2017-12-22 DIAGNOSIS — Z8659 Personal history of other mental and behavioral disorders: Secondary | ICD-10-CM

## 2017-12-22 DIAGNOSIS — F419 Anxiety disorder, unspecified: Secondary | ICD-10-CM

## 2017-12-22 DIAGNOSIS — Z639 Problem related to primary support group, unspecified: Secondary | ICD-10-CM

## 2017-12-22 DIAGNOSIS — R454 Irritability and anger: Secondary | ICD-10-CM

## 2017-12-22 DIAGNOSIS — Z560 Unemployment, unspecified: Secondary | ICD-10-CM

## 2017-12-22 DIAGNOSIS — R451 Restlessness and agitation: Secondary | ICD-10-CM

## 2017-12-22 DIAGNOSIS — R4587 Impulsiveness: Secondary | ICD-10-CM

## 2017-12-22 DIAGNOSIS — Z599 Problem related to housing and economic circumstances, unspecified: Secondary | ICD-10-CM

## 2017-12-22 MED FILL — LEVOTHYROXINE 100 MCG TAB: 100 | 30 days supply | Qty: 30 | Fill #4
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
## 2017-12-25 ENCOUNTER — Encounter: Payer: Self-pay | Admitting: Internal Medicine

## 2017-12-25 ENCOUNTER — Ambulatory Visit: Payer: Self-pay | Admitting: Licensed Clinical Social Worker

## 2017-12-25 ENCOUNTER — Ambulatory Visit: Payer: Self-pay | Attending: Internal Medicine | Admitting: Internal Medicine

## 2017-12-25 VITALS — BP 118/73 | HR 68 | Temp 98.9°F | Resp 16 | Wt 124.0 lb

## 2017-12-25 DIAGNOSIS — Z808 Family history of malignant neoplasm of other organs or systems: Secondary | ICD-10-CM | POA: Insufficient documentation

## 2017-12-25 DIAGNOSIS — F329 Major depressive disorder, single episode, unspecified: Secondary | ICD-10-CM | POA: Insufficient documentation

## 2017-12-25 DIAGNOSIS — E119 Type 2 diabetes mellitus without complications: Secondary | ICD-10-CM | POA: Insufficient documentation

## 2017-12-25 DIAGNOSIS — E039 Hypothyroidism, unspecified: Secondary | ICD-10-CM | POA: Insufficient documentation

## 2017-12-25 DIAGNOSIS — Z9889 Other specified postprocedural states: Secondary | ICD-10-CM | POA: Insufficient documentation

## 2017-12-25 DIAGNOSIS — Z955 Presence of coronary angioplasty implant and graft: Secondary | ICD-10-CM | POA: Insufficient documentation

## 2017-12-25 DIAGNOSIS — F17210 Nicotine dependence, cigarettes, uncomplicated: Secondary | ICD-10-CM | POA: Insufficient documentation

## 2017-12-25 DIAGNOSIS — L853 Xerosis cutis: Secondary | ICD-10-CM

## 2017-12-25 DIAGNOSIS — E785 Hyperlipidemia, unspecified: Secondary | ICD-10-CM | POA: Insufficient documentation

## 2017-12-25 DIAGNOSIS — Z7982 Long term (current) use of aspirin: Secondary | ICD-10-CM | POA: Insufficient documentation

## 2017-12-25 DIAGNOSIS — I10 Essential (primary) hypertension: Secondary | ICD-10-CM | POA: Insufficient documentation

## 2017-12-25 DIAGNOSIS — Z885 Allergy status to narcotic agent status: Secondary | ICD-10-CM | POA: Insufficient documentation

## 2017-12-25 DIAGNOSIS — L409 Psoriasis, unspecified: Secondary | ICD-10-CM | POA: Insufficient documentation

## 2017-12-25 DIAGNOSIS — K219 Gastro-esophageal reflux disease without esophagitis: Secondary | ICD-10-CM | POA: Insufficient documentation

## 2017-12-25 DIAGNOSIS — Z90710 Acquired absence of both cervix and uterus: Secondary | ICD-10-CM | POA: Insufficient documentation

## 2017-12-25 DIAGNOSIS — F4321 Adjustment disorder with depressed mood: Secondary | ICD-10-CM

## 2017-12-25 DIAGNOSIS — Z823 Family history of stroke: Secondary | ICD-10-CM | POA: Insufficient documentation

## 2017-12-25 DIAGNOSIS — Z803 Family history of malignant neoplasm of breast: Secondary | ICD-10-CM | POA: Insufficient documentation

## 2017-12-25 DIAGNOSIS — Z79899 Other long term (current) drug therapy: Secondary | ICD-10-CM | POA: Insufficient documentation

## 2017-12-25 DIAGNOSIS — F419 Anxiety disorder, unspecified: Secondary | ICD-10-CM | POA: Insufficient documentation

## 2017-12-25 DIAGNOSIS — Z888 Allergy status to other drugs, medicaments and biological substances status: Secondary | ICD-10-CM | POA: Insufficient documentation

## 2017-12-25 DIAGNOSIS — J449 Chronic obstructive pulmonary disease, unspecified: Secondary | ICD-10-CM | POA: Insufficient documentation

## 2017-12-25 DIAGNOSIS — L989 Disorder of the skin and subcutaneous tissue, unspecified: Secondary | ICD-10-CM

## 2017-12-25 DIAGNOSIS — Z79890 Hormone replacement therapy (postmenopausal): Secondary | ICD-10-CM | POA: Insufficient documentation

## 2017-12-25 DIAGNOSIS — Z8249 Family history of ischemic heart disease and other diseases of the circulatory system: Secondary | ICD-10-CM | POA: Insufficient documentation

## 2017-12-25 NOTE — BH Specialist Note (Signed)
Integrated Behavioral Health Follow Up Visit  MRN: [id number] Name: [name] [name]  Number of [place] Clinician visits: 2/6 Session Start time: 3:30 PM  Session End time: 3:45 PM Total time: 15 minutes  Type of Service: [place] Interpretor:No. Interpretor Name and Language: N/A  SUBJECTIVE: [name] [name] is a 62 y.o. female accompanied by Friend Patient was referred by Dr. Wynetta Emery for depression, anxiety, and grief support. Patient reports the following symptoms/concerns: overwhelming feelings of sadness and worry, difficulty sleeping, low energy, decreased concentration, feeling bad about self, and irritability  Duration of problem: Ongoing; Severity of problem: severe  OBJECTIVE: Mood: Anxious and Depressed and Affect: Depressed Risk of harm to self or others: No plan to harm self or others  LIFE CONTEXT: Family and Social: Pt receives support from family and friends.  School/Work: Pt does not have an income. Self-Care: Pt participates in behavioral health services through Dr. Adele Schilder and [place]: Pt has ongoing medical conditions. Her elderly mother passed away yesterday  GOALS ADDRESSED: Patient will: 1.  Reduce symptoms of: anxiety and depression  2.  Increase knowledge and/or ability of: coping skills  3.  Demonstrate ability to: Begin healthy grieving over loss  INTERVENTIONS: Interventions utilized:  Solution-Focused Strategies, Supportive Counseling and Link to [employer] Standardized Assessments completed: GAD-7 and PHQ 2&9  ASSESSMENT: Patient currently experiencing depression and anxiety triggered by ongoing medical conditions and the recent passing of mother. She reports overwhelming feelings of sadness and worry, difficulty sleeping, low energy, decreased concentration, feeling bad about self, and irritability. Pt denies SI/HI/AVH. She was accompanied by friend during visit.    Patient receives behavioral health services through Dr. Adele Schilder and Georgianne Fick, per Epic. LCSWA discussed stages of grief and referred pt to Hospice for grief counseling. Pt states that she has had a great experience with Hospice regarding the care of her mother and plans to schedule an appointment in the near future. She reports compliance with medication management and counseling sessions.   PLAN: 1. Follow up with behavioral health clinician on : Pt was encouraged to contact PCP and/or psychiatrist and counselor if symptoms worsen or fail to improve  2. Behavioral recommendations: LCSWA recommends that pt apply healthy coping skills discussed, comply with medication management, and initiate grief support services through Hospice.  3. Referral(s): Counselor 4. "From scale of 1-10, how likely are you to follow plan?": 10/10  Rebekah Chesterfield, LCSW 12/26/17 3:14 PM

## 2017-12-25 NOTE — Progress Notes (Signed)
Patient ID: [name] [name], female    DOB: 05-30-1956  MRN: [id number]  CC: Hand Pain   Subjective: [name] [name] is a 62 y.o. female who presents for urgent care visit.  Her friend [name] is with her. Her concerns today include:  Patient with history ofpre-DM, GERD, hypothyroidism, HTN, HL, CAD with previous stent, COPD, substance abuse and tobacco dependence  Patient complains of very dry skin on the finger pads and tips of the fingers.  The skin cracks and is painful.  She has seen dermatology in the past for the same and states that she was told to put petroleum jelly or Vaseline on it.  She uses petroleum jelly.  She is also noticed a spot on her back times a few months.  She cannot see it but feels that it is increasing in size.  Itches occasionally.  Patient Active Problem List   Diagnosis Date Noted  . Insomnia 10/28/2017  . COPD with acute exacerbation ([place]) 09/15/2017  . Pre-diabetes 05/28/2017  . Emesis, persistent 05/28/2017  . QT prolongation 03/25/2017  . Encounter for screening mammogram for breast cancer 09/11/2015  . Psoriasis 06/22/2015  . COPD (chronic obstructive pulmonary disease) ([place]) 05/18/2015  . Anxiety and depression 07/10/2013  . Essential hypertension, benign 06/07/2013  . Dyslipidemia 06/07/2013  . Asthma, chronic 06/07/2013  . Emphysema lung ([place]) 06/07/2013  . Chronic back pain 06/07/2013  . CAD S/P percutaneous coronary angioplasty 06/07/2013  . GERD (gastroesophageal reflux disease) 06/07/2013  . Breast lump on left side at 3 o'clock position 10/13/2012  . S/P abdominal hysterectomy and right salpingo-oophorectomy 08/29/2011  . COPD exacerbation ([place]) 09/15/2009  . TOBACCO ABUSE 07/17/2009  . Chronic rhinitis 07/17/2009  . Lung nodule < 6cm on CT 07/17/2009  . ALLERGY, FOOD 07/17/2009  . Hypothyroidism 12/22/2008     Current Outpatient Medications on File Prior to Visit  Medication Sig Dispense Refill  . albuterol (VENTOLIN HFA) 108 (90  Base) MCG/ACT inhaler Inhale 2 puffs every 6 (six) hours as needed into the lungs for wheezing or shortness of breath. 18 g 2  . aspirin EC 81 MG tablet Take 81 mg by mouth daily.    Marland Kitchen atorvastatin (LIPITOR) 40 MG tablet Take 1 tablet (40 mg total) daily by mouth. (Patient taking differently: Take 40 mg by mouth at bedtime. ) 90 tablet 3  . Blood Glucose Monitoring Suppl (TRUE METRIX METER) DEVI 1 kit by Does not apply route 4 (four) times daily. 1 Device 0  . busPIRone (BUSPAR) 5 MG tablet Take 1 tablet (5 mg total) by mouth 2 (two) times daily. 60 tablet 0  . Calcium 600 MG tablet Take 600 mg by mouth daily.  60 tablet   . cyclobenzaprine (FLEXERIL) 10 MG tablet TAKE 1 TABLET BY MOUTH 3 TIMES DAILY AS NEEDED. 30 tablet 2  . escitalopram (LEXAPRO) 10 MG tablet Take 1.5 tablets (15 mg total) by mouth daily. 45 tablet 6  . fluticasone (FLONASE) 50 MCG/ACT nasal spray Place 2 sprays into both nostrils daily. 16 g 6  . fluticasone furoate-vilanterol (BREO ELLIPTA) 200-25 MCG/INH AEPB Inhale 1 puff daily into the lungs. 60 each 3  . furosemide (LASIX) 20 MG tablet TAKE 1 TABLET BY MOUTH DAILY. 30 tablet 3  . gabapentin (NEURONTIN) 600 MG tablet TAKE 1 TABLET BY MOUTH 4 TIMES DAILY (Patient taking differently: Take 600 mg by mouth 4 (four) times daily. ) 120 tablet 3  . glucose blood (TRUE METRIX BLOOD GLUCOSE TEST) test  strip Use as instructed 100 each 12  . hydrOXYzine (VISTARIL) 25 MG capsule TAKE 25MG BY MOUTH THREE TIMES DAILY 90 capsule 3  . ipratropium-albuterol (DUONEB) 0.5-2.5 (3) MG/3ML SOLN Take 3 mLs every 6 (six) hours as needed by nebulization. (Patient taking differently: Take 3 mLs by nebulization every 6 (six) hours as needed (wheezing). ) 360 mL 3  . labetalol (NORMODYNE) 100 MG tablet Take 1 tablet (100 mg total) by mouth 2 (two) times daily. 60 tablet 6  . levothyroxine (SYNTHROID, LEVOTHROID) 100 MCG tablet TAKE 1 TABLET  100 MCG TOTAL  DAILY BY MOUTH  6  . levothyroxine (SYNTHROID,  LEVOTHROID) 75 MCG tablet Take 1 tablet (75 mcg total) by mouth daily. 30 tablet 6  . loratadine (CLARITIN) 10 MG tablet Take 1 tablet (10 mg total) by mouth daily. 30 tablet 11  . losartan (COZAAR) 50 MG tablet Take 1.5 tablets (75 mg total) by mouth daily. (Patient taking differently: Take 75 mg by mouth at bedtime. ) 45 tablet 6  . Melatonin 10 MG SUBL Place 20 mg under the tongue at bedtime.    . montelukast (SINGULAIR) 10 MG tablet Take 1 tablet (10 mg total) at bedtime by mouth. 30 tablet 3  . naproxen sodium (ALEVE) 220 MG tablet Take 440 mg by mouth 2 (two) times daily.    . nicotine (NICODERM CQ - DOSED IN MG/24 HOURS) 21 mg/24hr patch Place 1 patch (21 mg total) onto the skin daily. 28 patch 0  . omeprazole (PRILOSEC) 40 MG capsule Take 1 capsule (40 mg total) by mouth daily. 30 capsule 3  . ondansetron (ZOFRAN) 4 MG tablet Take 1 tablet (4 mg total) every 8 (eight) hours as needed by mouth for nausea or vomiting. 20 tablet 0  . QUEtiapine (SEROQUEL) 100 MG tablet Take 1/2 to one tab at bed time 30 tablet 0  . ticagrelor (BRILINTA) 90 MG TABS tablet Take 1 tablet (90 mg total) by mouth 2 (two) times daily. 60 tablet 6  . triamcinolone cream (KENALOG) 0.1 % APPLY 1 APPLICATION TOPICALLY 2 TIMES DAILY. 30 g 0  . TRUEPLUS LANCETS 28G MISC 28 g by Does not apply route QID. 120 each 2  . umeclidinium bromide (INCRUSE ELLIPTA) 62.5 MCG/INH AEPB Inhale 1 puff into the lungs daily. 1 each 6   No current facility-administered medications on file prior to visit.     Allergies  Allergen Reactions  . Avocado Anaphylaxis  . Latex Shortness Of Breath and Rash  . Codeine Nausea Only    Social History   Socioeconomic History  . Marital status: Divorced    Spouse name: Not on file  . Number of children: Not on file  . Years of education: Not on file  . Highest education level: Not on file  Occupational History  . Not on file  Social Needs  . Financial resource strain: Not on file  .  Food insecurity:    Worry: Not on file    Inability: Not on file  . Transportation needs:    Medical: Not on file    Non-medical: Not on file  Tobacco Use  . Smoking status: Current Every Day Smoker    Packs/day: 0.50    Years: 49.00    Pack years: 24.50    Types: Cigarettes  . Smokeless tobacco: Never Used  Substance and Sexual Activity  . Alcohol use: Not Currently  . Drug use: No    Types: Cocaine    Comment: 03/25/2017 "used  cocaine a couple days ago; smoke pot from time to time"  . Sexual activity: Not Currently    Birth control/protection: Surgical  Lifestyle  . Physical activity:    Days per week: Not on file    Minutes per session: Not on file  . Stress: Not on file  Relationships  . Social connections:    Talks on phone: Not on file    Gets together: Not on file    Attends religious service: Not on file    Active member of club or organization: Not on file    Attends meetings of clubs or organizations: Not on file    Relationship status: Not on file  . Intimate partner violence:    Fear of current or ex partner: Not on file    Emotionally abused: Not on file    Physically abused: Not on file    Forced sexual activity: Not on file  Other Topics Concern  . Not on file  Social History Narrative  . Not on file    Family History  Problem Relation Age of Onset  . Heart disease Mother   . Hypertension Mother   . Stroke Mother   . Mental illness Mother   . Heart disease Father   . Hypertension Father   . Diabetes Father   . Cancer Maternal Aunt        breast  . Thyroid disease Paternal Aunt   . Heart disease Maternal Grandfather   . Anesthesia problems Daughter   . Cancer Paternal Grandmother        mouth  . Hypotension Neg Hx   . Malignant hyperthermia Neg Hx   . Pseudochol deficiency Neg Hx     Past Surgical History:  Procedure Laterality Date  . ABDOMINAL EXPLORATION SURGERY  1977  . ABDOMINAL HYSTERECTOMY  1977   "left one of my ovaries"  .  BACK SURGERY    . COLONOSCOPY    . CORONARY ANGIOPLASTY WITH STENT PLACEMENT  2011 X2   "regular stents didn't work; had to go back in in ~ 1 month and put in medicated stents"  . DILATION AND CURETTAGE OF UTERUS    . ESOPHAGOGASTRODUODENOSCOPY    . LEFT HEART CATH AND CORONARY ANGIOGRAPHY N/A 03/26/2017   Procedure: LEFT HEART CATH AND CORONARY ANGIOGRAPHY;  Surgeon: Belva Crome, MD;  Location: [facility];  Service: Cardiovascular;  Laterality: N/A;  . LUMBAR LAMINECTOMY/DECOMPRESSION MICRODISCECTOMY  08/05/2011   Procedure: LUMBAR LAMINECTOMY/DECOMPRESSION MICRODISCECTOMY;  Surgeon: Otilio Connors, MD;  Location: [facility];  Service: Neurosurgery;  Laterality: Right;  Right Lumbar four-five extraforaminal discectomy  . TONSILLECTOMY     as a child    ROS: Review of Systems Negative except as stated above. PHYSICAL EXAM: BP 118/73   Pulse 68   Temp 98.9 F (37.2 C) (Oral)   Resp 16   Wt 124 lb (56.2 kg)   SpO2 98%   BMI 23.05 kg/m   Physical Exam  General appearance -patient appears tired and a drowsy Skin -less than 1 cm raised hornlike lesion mid posterior thorax Hands: Dry peeling cracked skin on the finger pads, tips of the fingers and around nailbeds.  ASSESSMENT AND PLAN: 1. Skin lesion I think this is a benign horn.  However I will refer to dermatology  2. Dry skin Recommend using triamcinolone cream during the day.  Use petroleum jelly at night and put on cotton gloves to sleep in  Patient was given the  opportunity to ask questions.  Patient verbalized understanding of the plan and was able to repeat key elements of the plan.   No orders of the defined types were placed in this encounter.    Requested Prescriptions    No prescriptions requested or ordered in this encounter    No follow-ups on file.  Karle Plumber, MD, FACP

## 2017-12-25 NOTE — Patient Instructions (Signed)
Apply Triamcinolone cream to hands during the day and petroleum jelly at nights. Wear gloves at night after applying petroleum jelly.

## 2017-12-27 ENCOUNTER — Ambulatory Visit (HOSPITAL_COMMUNITY): Payer: No Typology Code available for payment source | Admitting: Psychiatry

## 2018-01-02 ENCOUNTER — Encounter: Payer: Self-pay | Admitting: Internal Medicine

## 2018-01-16 ENCOUNTER — Other Ambulatory Visit: Payer: Self-pay | Admitting: Critical Care Medicine

## 2018-01-16 MED FILL — OMEPRAZOLE DR 40 MG CAPSULE: 40 | 30 days supply | Qty: 30 | Fill #2

## 2018-01-16 MED FILL — LEVOTHYROXINE 100 MCG TAB: 100 | 30 days supply | Qty: 30 | Fill #5

## 2018-01-16 MED FILL — ESCITALOPRAM 10 MG TABLET: 10 | 30 days supply | Qty: 45 | Fill #3

## 2018-01-16 MED FILL — !BRILINTA 90 MG TABLET: 30 days supply | Qty: 60 | Fill #1

## 2018-01-16 MED FILL — LABETALOL HCL 100 MG TABS: 100 | 30 days supply | Qty: 60 | Fill #1

## 2018-01-16 MED FILL — ?FUROSEMIDE 20 MG TABLET: 20 | 30 days supply | Qty: 30 | Fill #1

## 2018-01-16 MED FILL — FLUTICASONE PROP 50 MCG SPR: 50 | 30 days supply | Qty: 16 | Fill #1

## 2018-01-20 ENCOUNTER — Other Ambulatory Visit: Payer: Self-pay

## 2018-01-20 MED FILL — ?ATORVASTATIN 40MG TABLET: 40 | 30 days supply | Qty: 30 | Fill #6

## 2018-01-20 MED FILL — $BREO ELLIPTA 200-25 MCG IN: 200-25 | 30 days supply | Qty: 60 | Fill #3

## 2018-01-20 MED FILL — LOSARTAN POTASSIUM 50 MG TA: 50 | 30 days supply | Qty: 45 | Fill #5

## 2018-01-20 MED FILL — CYCLOBENZAPRINE 10 MG TAB: 10 | 10 days supply | Qty: 30 | Fill #1

## 2018-01-21 MED ORDER — GABAPENTIN 600 MG PO TABS: ORAL_TABLET | ORAL | 3 refills | Status: DC

## 2018-01-21 MED ORDER — GLUCOSE BLOOD VI STRP
ORAL_STRIP | 12 refills | Status: DC
Start: 2018-01-21 — End: 2023-09-05

## 2018-01-21 MED FILL — GABAPENTIN 600 MG TABLET: 600 | 30 days supply | Qty: 120 | Fill #0

## 2018-01-21 MED FILL — TRUE METRIX TEST STRIP: 30 days supply | Qty: 100 | Fill #0

## 2018-01-22 ENCOUNTER — Ambulatory Visit (HOSPITAL_COMMUNITY): Payer: No Typology Code available for payment source | Admitting: Psychiatry

## 2018-01-27 ENCOUNTER — Encounter (HOSPITAL_COMMUNITY): Payer: Self-pay | Admitting: Licensed Clinical Social Worker

## 2018-01-27 ENCOUNTER — Telehealth (HOSPITAL_COMMUNITY): Payer: Self-pay

## 2018-01-27 ENCOUNTER — Ambulatory Visit (INDEPENDENT_AMBULATORY_CARE_PROVIDER_SITE_OTHER): Payer: Self-pay | Admitting: Licensed Clinical Social Worker

## 2018-01-27 DIAGNOSIS — F319 Bipolar disorder, unspecified: Secondary | ICD-10-CM

## 2018-01-27 NOTE — Progress Notes (Signed)
   THERAPIST PROGRESS NOTE  Session Time: 3;10-4pm  Participation Level: Active  Behavioral Response: CasualAlertAnxious  Type of Therapy: Individual Therapy  Treatment Goals addressed: Coping  Interventions: CBT  Summary: [name] [name] is a 61 y.o. female who presents for her initial individual counseling session. Pt was tearful upon presentation. Her mother passed away last month. She moved out of her condo where they lived and into an apt. She will move to the [facility] adults in December. She will also draw Social Security in December. Pt reports she has minimally cried since her mother's death. Pt cried in session and talked about her mother at length. Asked open ended questions. Pt's only support system in her daughter. They were estranged for many years due to her many years of alcoholism and cocaine addiction. Pt is going to Hospice for grief counseling sometime this month. Pt sees her PCP at [facility]. She used cocaine a few months ago but reports she hasn't used cocaine in months. However she drank wine last night. Discussed the necessity to continue in her sobriety and suggested to return to [place].   Suicidal/Homicidal: Nowithout intent/plan  Therapist Response: Assessed pt's current functioning and reviewed progress. Assisted pt processing her mother's death, support system, alcohol use, grieving process.  Plan: Return again in 2 weeks.  Diagnosis: Axis I: Bipolar 1 Disorder    MACKENZIE,LISBETH S, LCAS 01/27/2018

## 2018-01-27 NOTE — Telephone Encounter (Signed)
This is an [facility] patient, she is currently on Seroquel and states that it is giving her restless leg syndrome and she can no longer take it. Patient would like a medication change, please review and advise, thank you. P.S. Patient is coming to see you on 8/14

## 2018-01-28 NOTE — Telephone Encounter (Signed)
She is welcome to come sooner to discuss

## 2018-01-30 ENCOUNTER — Telehealth: Payer: Self-pay

## 2018-01-30 NOTE — Telephone Encounter (Signed)
Patient called to inform PCP that daughter is authorized to call or make decision on her behalf. Letter can be faxed to number provided in first encounter.

## 2018-01-30 NOTE — Telephone Encounter (Signed)
Patient daughter called and states that patient needs a letter stating that she can live on her own. Letter needs to be addressed [name] [name] and faxed over to [phone]. Patient has been waiting on letter sine 01/03/18.

## 2018-01-30 NOTE — Telephone Encounter (Signed)
Contacted pt to to go over Dr. Wynetta Emery response pt didn't answer lvm asking pt to give me a call at her earliest convenience

## 2018-01-30 NOTE — Telephone Encounter (Signed)
Will forward to pcp

## 2018-02-02 NOTE — Telephone Encounter (Signed)
Letter has been faxed over to number provided.

## 2018-02-03 ENCOUNTER — Ambulatory Visit: Payer: Self-pay | Admitting: Internal Medicine

## 2018-02-03 ENCOUNTER — Other Ambulatory Visit: Payer: Self-pay | Admitting: Physician Assistant

## 2018-02-03 NOTE — Telephone Encounter (Signed)
FWD to PCP. Jadea Shiffer S Jaslyn Bansal, CMA  

## 2018-02-09 ENCOUNTER — Telehealth (HOSPITAL_COMMUNITY): Payer: Self-pay | Admitting: Professional

## 2018-02-10 ENCOUNTER — Encounter (HOSPITAL_COMMUNITY): Payer: Self-pay | Admitting: Licensed Clinical Social Worker

## 2018-02-10 ENCOUNTER — Ambulatory Visit (INDEPENDENT_AMBULATORY_CARE_PROVIDER_SITE_OTHER): Payer: No Typology Code available for payment source | Admitting: Psychiatry

## 2018-02-10 ENCOUNTER — Ambulatory Visit (INDEPENDENT_AMBULATORY_CARE_PROVIDER_SITE_OTHER): Payer: Self-pay | Admitting: Licensed Clinical Social Worker

## 2018-02-10 ENCOUNTER — Encounter (HOSPITAL_COMMUNITY): Payer: Self-pay | Admitting: Psychiatry

## 2018-02-10 VITALS — BP 142/80 | HR 88 | Ht 61.5 in | Wt 121.0 lb

## 2018-02-10 DIAGNOSIS — F17210 Nicotine dependence, cigarettes, uncomplicated: Secondary | ICD-10-CM

## 2018-02-10 DIAGNOSIS — F329 Major depressive disorder, single episode, unspecified: Secondary | ICD-10-CM

## 2018-02-10 DIAGNOSIS — F4312 Post-traumatic stress disorder, chronic: Secondary | ICD-10-CM

## 2018-02-10 DIAGNOSIS — F1410 Cocaine abuse, uncomplicated: Secondary | ICD-10-CM

## 2018-02-10 DIAGNOSIS — G4700 Insomnia, unspecified: Secondary | ICD-10-CM

## 2018-02-10 DIAGNOSIS — F319 Bipolar disorder, unspecified: Secondary | ICD-10-CM

## 2018-02-10 DIAGNOSIS — F1210 Cannabis abuse, uncomplicated: Secondary | ICD-10-CM

## 2018-02-10 DIAGNOSIS — F411 Generalized anxiety disorder: Secondary | ICD-10-CM

## 2018-02-10 DIAGNOSIS — Z818 Family history of other mental and behavioral disorders: Secondary | ICD-10-CM

## 2018-02-10 DIAGNOSIS — F32A Depression, unspecified: Secondary | ICD-10-CM

## 2018-02-10 MED ORDER — BUSPIRONE HCL 15 MG PO TABS: 15.0000 mg | ORAL_TABLET | Freq: Three times a day (TID) | ORAL | 0 refills | Status: DC

## 2018-02-10 MED ORDER — MIRTAZAPINE 15 MG PO TABS: 15.0000 mg | ORAL_TABLET | Freq: Every day | ORAL | 0 refills | Status: DC

## 2018-02-10 MED ORDER — ESCITALOPRAM OXALATE 20 MG PO TABS: 20.0000 mg | ORAL_TABLET | Freq: Every day | ORAL | 0 refills | Status: DC

## 2018-02-10 NOTE — Progress Notes (Signed)
[name] [name] OP Progress Note  02/10/2018 2:27 PM QUADASIA [name]  MRN:  [id number]  Chief Complaint: med management, restlessness HPI: [name] [name] reports that she still has a lot of anxiety and depression.  She reports that she thinks she has ADHD because she always feels very anxious and hyperactive.  Spent time with her discussing some strategies to help reduce her anxiety, treat the depressive symptoms and help her with sleep.  Discussed the goals of reducing and avoiding habit forming medications given her past history.  We agreed to increase Lexapro to 20 mg, and initiate augmentation with Remeron 15 mg nightly.  We also agreed to up titrate BuSpar to 15 mg 3 times a day.  She denies any acute safety issues.  She is grieving the loss of her mother, she was her primary caretaker for the past 11 years.  She is engaged in grief counseling with the hospice center, and has started doing yoga with her daughter as a way to practice breathing and positive coping strategies.  She agrees to call if she has any questions in the meantime and otherwise we will schedule follow-up with Dr. Adele Schilder.  Visit Diagnosis:    ICD-10-CM   1. Chronic post-traumatic stress disorder (PTSD) F43.12   2. Depression, unspecified depression type F32.9 escitalopram (LEXAPRO) 20 MG tablet    mirtazapine (REMERON) 15 MG tablet  3. GAD (generalized anxiety disorder) F41.1 mirtazapine (REMERON) 15 MG tablet    busPIRone (BUSPAR) 15 MG tablet  4. Insomnia, unspecified type G47.00     Past Psychiatric History: history of substance use, crack cocaine addiction, alcohol use disorder  Past Medical History:  Past Medical History:  Diagnosis Date  . Anxiety    takes Lexapro daily  . Arthritis    "back, from neck down pass my bra area" (03/25/2017)  . Asthma   . Bartholin gland cyst 08/29/2011  . Bruises easily    pt is on Effient  . Chronic back pain    herniated nucleus pulposus  . Chronic back pain    "neck to bra  area; lower back" (03/25/2017)  . COPD (chronic obstructive pulmonary disease) ([place])    early stages  . Coronary artery disease   . Depression    takes Klonopin daily  . Diabetes mellitus without complication ([place])   . Diverticulosis   . GERD (gastroesophageal reflux disease)    takes Nexium daily  . H/O hiatal hernia   . Heart attack ([place]) 2011  . Hemorrhoids   . Hernia   . Hyperlipidemia    takes Lipitor daily  . Hypertension    takes Losartan daily and Labetalol bid  . Hypothyroidism    takes Synthroid daily  . Insomnia    hydroxyzine prn  . Joint pain   . Pneumonia    "couple times" (03/25/2017)  . Pre-diabetes    "just found out 1 wk ago" (03/25/2017)  . Psoriasis    elbows,knees,back  . Shortness of breath    with exertion  . Slowing of urinary stream   . Stress incontinence     Past Surgical History:  Procedure Laterality Date  . ABDOMINAL EXPLORATION SURGERY  1977  . ABDOMINAL HYSTERECTOMY  1977   "left one of my ovaries"  . BACK SURGERY    . COLONOSCOPY    . CORONARY ANGIOPLASTY WITH STENT PLACEMENT  2011 X2   "regular stents didn't work; had to go back in in ~ 1 month and put in medicated stents"  .  DILATION AND CURETTAGE OF UTERUS    . ESOPHAGOGASTRODUODENOSCOPY    . LEFT HEART CATH AND CORONARY ANGIOGRAPHY N/A 03/26/2017   Procedure: LEFT HEART CATH AND CORONARY ANGIOGRAPHY;  Surgeon: Belva Crome, MD;  Location: [facility];  Service: Cardiovascular;  Laterality: N/A;  . LUMBAR LAMINECTOMY/DECOMPRESSION MICRODISCECTOMY  08/05/2011   Procedure: LUMBAR LAMINECTOMY/DECOMPRESSION MICRODISCECTOMY;  Surgeon: Otilio Connors, MD;  Location: [facility];  Service: Neurosurgery;  Laterality: Right;  Right Lumbar four-five extraforaminal discectomy  . TONSILLECTOMY     as a child    Family Psychiatric History: See intake H&P for full details. Reviewed, with no updates at this time.   Family History:  Family History  Problem Relation Age of Onset  .  Heart disease Mother   . Hypertension Mother   . Stroke Mother   . Mental illness Mother   . Heart disease Father   . Hypertension Father   . Diabetes Father   . Cancer Maternal Aunt        breast  . Thyroid disease Paternal Aunt   . Heart disease Maternal Grandfather   . Anesthesia problems Daughter   . Cancer Paternal Grandmother        mouth  . Hypotension Neg Hx   . Malignant hyperthermia Neg Hx   . Pseudochol deficiency Neg Hx     Social History:  Social History   Socioeconomic History  . Marital status: Divorced    Spouse name: Not on file  . Number of children: Not on file  . Years of education: Not on file  . Highest education level: Not on file  Occupational History  . Not on file  Social Needs  . Financial resource strain: Not on file  . Food insecurity:    Worry: Not on file    Inability: Not on file  . Transportation needs:    Medical: Not on file    Non-medical: Not on file  Tobacco Use  . Smoking status: Current Every Day Smoker    Packs/day: 0.50    Years: 49.00    Pack years: 24.50    Types: Cigarettes  . Smokeless tobacco: Never Used  Substance and Sexual Activity  . Alcohol use: Not Currently  . Drug use: No    Types: Cocaine    Comment: 03/25/2017 "used cocaine a couple days ago; smoke pot from time to time"  . Sexual activity: Not Currently    Birth control/protection: Surgical  Lifestyle  . Physical activity:    Days per week: Not on file    Minutes per session: Not on file  . Stress: Not on file  Relationships  . Social connections:    Talks on phone: Not on file    Gets together: Not on file    Attends religious service: Not on file    Active member of club or organization: Not on file    Attends meetings of clubs or organizations: Not on file    Relationship status: Not on file  Other Topics Concern  . Not on file  Social History Narrative  . Not on file    Allergies:  Allergies  Allergen Reactions  . Avocado Anaphylaxis   . Latex Shortness Of Breath and Rash  . Codeine Nausea Only    Metabolic Disorder Labs: Lab Results  Component Value Date   HGBA1C 5.9 10/28/2017   No results found for: PROLACTIN Lab Results  Component Value Date   CHOL 208 (H) 04/16/2016  TRIG 74 04/16/2016   HDL 55 04/16/2016   CHOLHDL 3.8 04/16/2016   VLDL 15 04/16/2016   LDLCALC 138 (H) 04/16/2016   LDLCALC 71 06/22/2015   Lab Results  Component Value Date   TSH 0.317 (L) 09/18/2017   TSH 0.386 (L) 10/31/2016    Therapeutic Level Labs: No results found for: LITHIUM No results found for: VALPROATE No components found for:  CBMZ  Current Medications: Current Outpatient Medications  Medication Sig Dispense Refill  . albuterol (VENTOLIN HFA) 108 (90 Base) MCG/ACT inhaler Inhale 2 puffs every 6 (six) hours as needed into the lungs for wheezing or shortness of breath. 18 g 2  . aspirin EC 81 MG tablet Take 81 mg by mouth daily.    . Blood Glucose Monitoring Suppl (TRUE METRIX METER) DEVI 1 kit by Does not apply route 4 (four) times daily. 1 Device 0  . busPIRone (BUSPAR) 15 MG tablet Take 1 tablet (15 mg total) by mouth 3 (three) times daily. 270 tablet 0  . Calcium 600 MG tablet Take 600 mg by mouth daily.  60 tablet   . cyclobenzaprine (FLEXERIL) 10 MG tablet TAKE 1 TABLET BY MOUTH 3 TIMES DAILY AS NEEDED. 30 tablet 2  . escitalopram (LEXAPRO) 20 MG tablet Take 1 tablet (20 mg total) by mouth daily. 90 tablet 0  . fluticasone (FLONASE) 50 MCG/ACT nasal spray Place 2 sprays into both nostrils daily. 16 g 6  . fluticasone furoate-vilanterol (BREO ELLIPTA) 200-25 MCG/INH AEPB Inhale 1 puff daily into the lungs. 60 each 3  . furosemide (LASIX) 20 MG tablet TAKE 1 TABLET BY MOUTH DAILY. 30 tablet 3  . gabapentin (NEURONTIN) 600 MG tablet TAKE 1 TABLET BY MOUTH 4 TIMES DAILY 120 tablet 3  . glucose blood (TRUE METRIX BLOOD GLUCOSE TEST) test strip Use as instructed 100 each 12  . hydrOXYzine (VISTARIL) 25 MG capsule TAKE  25MG BY MOUTH THREE TIMES DAILY 90 capsule 3  . ipratropium-albuterol (DUONEB) 0.5-2.5 (3) MG/3ML SOLN Take 3 mLs every 6 (six) hours as needed by nebulization. (Patient taking differently: Take 3 mLs by nebulization every 6 (six) hours as needed (wheezing). ) 360 mL 3  . labetalol (NORMODYNE) 100 MG tablet Take 1 tablet (100 mg total) by mouth 2 (two) times daily. 60 tablet 6  . levothyroxine (SYNTHROID, LEVOTHROID) 100 MCG tablet TAKE 1 TABLET  100 MCG TOTAL  DAILY BY MOUTH  6  . levothyroxine (SYNTHROID, LEVOTHROID) 75 MCG tablet Take 1 tablet (75 mcg total) by mouth daily. 30 tablet 6  . loratadine (CLARITIN) 10 MG tablet Take 1 tablet (10 mg total) by mouth daily. 30 tablet 11  . losartan (COZAAR) 50 MG tablet Take 1.5 tablets (75 mg total) by mouth daily. (Patient taking differently: Take 75 mg by mouth at bedtime. ) 45 tablet 6  . montelukast (SINGULAIR) 10 MG tablet TAKE 1 TABLET BY MOUTH AT BEDTIME. 30 tablet 2  . naproxen sodium (ALEVE) 220 MG tablet Take 440 mg by mouth 2 (two) times daily.    . nicotine (NICODERM CQ - DOSED IN MG/24 HOURS) 21 mg/24hr patch Place 1 patch (21 mg total) onto the skin daily. 28 patch 0  . omeprazole (PRILOSEC) 40 MG capsule Take 1 capsule (40 mg total) by mouth daily. 30 capsule 3  . ondansetron (ZOFRAN) 4 MG tablet Take 1 tablet (4 mg total) every 8 (eight) hours as needed by mouth for nausea or vomiting. 20 tablet 0  . ticagrelor (BRILINTA) 90 MG  TABS tablet Take 1 tablet (90 mg total) by mouth 2 (two) times daily. 60 tablet 6  . triamcinolone cream (KENALOG) 0.1 % APPLY 1 APPLICATION TOPICALLY 2 TIMES DAILY. 30 g 0  . TRUEPLUS LANCETS 28G MISC 28 g by Does not apply route QID. 120 each 2  . umeclidinium bromide (INCRUSE ELLIPTA) 62.5 MCG/INH AEPB Inhale 1 puff into the lungs daily. 1 each 6  . atorvastatin (LIPITOR) 40 MG tablet Take 1 tablet (40 mg total) daily by mouth. (Patient taking differently: Take 40 mg by mouth at bedtime. ) 90 tablet 3  .  mirtazapine (REMERON) 15 MG tablet Take 1 tablet (15 mg total) by mouth at bedtime. 90 tablet 0   No current facility-administered medications for this visit.     Musculoskeletal: Strength & Muscle Tone: within normal limits Gait & Station: normal Patient leans: N/A  Psychiatric Specialty Exam: ROS  Blood pressure (!) 142/80, pulse 88, height 5' 1.5" (1.562 m), weight 121 lb (54.9 kg).Body mass index is 22.49 kg/m.  General Appearance: Casual and Fairly Groomed  Eye Contact:  Fair  Speech:  Clear and Coherent and Normal Rate  Volume:  Normal  Mood:  Anxious and Depressed  Affect:  Appropriate and Congruent  Thought Process:  Goal Directed and Descriptions of Associations: Intact  Orientation:  Full (Time, Place, and Person)  Thought Content: Logical   Suicidal Thoughts:  No  Homicidal Thoughts:  No  Memory:  Immediate;   Good  Judgement:  Fair  Insight:  Fair  Psychomotor Activity:  Normal  Concentration:  Concentration: Fair  Recall:  [place] of Knowledge: Fair  Language: Fair  Akathisia:  Negative  Handed:  Right  AIMS (if indicated): not done  Assets:  Communication Skills Desire for Improvement Housing  ADL's:  Intact  Cognition: WNL  Sleep:  Poor   Screenings: AUDIT     Admission (Discharged) from 07/10/2013 in [facility]  Alcohol Use Disorder Identification Test Final Score (AUDIT)  12    GAD-7     Office Visit from 12/25/2017 in [facility] Counselor from 12/22/2017 in [facility] Office Visit from 09/18/2017 in [facility] Visit from 04/04/2017 in [facility] Visit from 03/13/2017 in [facility]  Total GAD-7 Score  '18  21  12  7  15    ' PHQ2-9     Office Visit from 12/25/2017 in [facility] Counselor from 12/22/2017 in [facility] Office Visit from 09/18/2017 in [facility] Visit from 05/28/2017 in [facility] Office Visit from 04/04/2017 in [facility]  PHQ-2 Total Score  '6  6  2  2  2  ' PHQ-9 Total Score  '24  23  8  9  7       ' Assessment and Plan:  [name] [name] is a 62 year old female with unspecified depression and generalized anxiety.  She  presents for cross coverage, for medication management check in.  Overall she is struggling with some expected grief and coping with things fairly well through use of yoga, grief counseling, and individual therapy.  She has not engaged in any substance abuse.  She has trouble sleeping at night, and we discussed the use of Remeron nightly which she is agreeable  to.  Seroquel seemed to help a little bit but made her feel very restless in her legs and caused leg cramps.  She was agreeable to also increasing Lexapro and BuSpar to further target unspecified depression and anxiety symptoms.  We will follow-up in 4-6 weeks or sooner if needed.  1. Chronic post-traumatic stress disorder (PTSD)   2. Depression, unspecified depression type   3. GAD (generalized anxiety disorder)   4. Insomnia, unspecified type     Status of current problems: new to [employer] Ordered: No orders of the defined types were placed in this encounter.   Labs Reviewed: na  Collateral Obtained/Records Reviewed: Reviewed notes from Dr. Adele Schilder  Plan:  Increase Lexapro to 20 mg Increase BuSpar to 15 mg 3 times daily Seroquel discontinued by patient given  restless legs and leg cramps Initiate Remeron 15 mg nightly Continue individual therapy and palliative/hospice grief counselor   Aundra Dubin, MD 02/10/2018, 2:27 PM
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
## 2018-02-18 MED FILL — ESCITALOPRAM 20 MG TABLET: 20 | 90 days supply | Qty: 90 | Fill #0

## 2018-02-18 MED FILL — LABETALOL HCL 100 MG TABS: 100 | 30 days supply | Qty: 60 | Fill #2

## 2018-02-18 MED FILL — ?MONTELUKAST SODI 10MG TAB: 10 | 30 days supply | Qty: 30 | Fill #0

## 2018-02-18 MED FILL — ?FUROSEMIDE 20 MG TABLET: 20 | 30 days supply | Qty: 30 | Fill #2

## 2018-02-18 MED FILL — LOSARTAN POTASSIUM 50 MG TA: 50 | 30 days supply | Qty: 45 | Fill #6

## 2018-02-18 MED FILL — CYCLOBENZAPRINE 10 MG TAB: 10 | 10 days supply | Qty: 30 | Fill #2

## 2018-02-18 MED FILL — ?ATORVASTATIN 40MG TABLET: 40 | 30 days supply | Qty: 30 | Fill #7

## 2018-02-18 MED FILL — GABAPENTIN 600 MG TABLET: 600 | 30 days supply | Qty: 120 | Fill #1

## 2018-02-18 MED FILL — $INCRUSE ELLIPTA 62.5 MCG I: 62.5 MCG | 90 days supply | Qty: 90 | Fill #2

## 2018-02-18 MED FILL — FLUTICASONE PROP 50 MCG SPR: 50 | 30 days supply | Qty: 16 | Fill #2

## 2018-02-18 MED FILL — LEVOTHYROXINE 100 MCG TAB: 100 | 30 days supply | Qty: 30 | Fill #6

## 2018-02-18 MED FILL — !VENTOLIN HFA INHALER: 108 (90 BAS | 25 days supply | Qty: 18 | Fill #2

## 2018-02-18 MED FILL — !BRILINTA 90 MG TABLET: 90 days supply | Qty: 180 | Fill #2

## 2018-02-18 MED FILL — OMEPRAZOLE DR 40 MG CAPSULE: 40 | 30 days supply | Qty: 30 | Fill #3

## 2018-02-24 ENCOUNTER — Encounter (HOSPITAL_COMMUNITY): Payer: Self-pay | Admitting: Licensed Clinical Social Worker

## 2018-02-24 ENCOUNTER — Ambulatory Visit (INDEPENDENT_AMBULATORY_CARE_PROVIDER_SITE_OTHER): Payer: No Typology Code available for payment source | Admitting: Licensed Clinical Social Worker

## 2018-02-24 DIAGNOSIS — F32A Depression, unspecified: Secondary | ICD-10-CM

## 2018-02-24 DIAGNOSIS — F329 Major depressive disorder, single episode, unspecified: Secondary | ICD-10-CM
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
## 2018-02-25 ENCOUNTER — Ambulatory Visit (HOSPITAL_COMMUNITY): Payer: Self-pay | Admitting: Psychiatry

## 2018-02-27 ENCOUNTER — Ambulatory Visit: Payer: Self-pay | Admitting: Internal Medicine

## 2018-03-09 ENCOUNTER — Ambulatory Visit: Payer: Self-pay | Admitting: Internal Medicine

## 2018-03-10 ENCOUNTER — Ambulatory Visit (HOSPITAL_COMMUNITY): Payer: Self-pay | Admitting: Licensed Clinical Social Worker

## 2018-03-12 ENCOUNTER — Ambulatory Visit (INDEPENDENT_AMBULATORY_CARE_PROVIDER_SITE_OTHER): Payer: No Typology Code available for payment source | Admitting: Licensed Clinical Social Worker

## 2018-03-12 ENCOUNTER — Encounter (HOSPITAL_COMMUNITY): Payer: Self-pay | Admitting: Licensed Clinical Social Worker

## 2018-03-12 DIAGNOSIS — F319 Bipolar disorder, unspecified: Secondary | ICD-10-CM

## 2018-03-12 NOTE — Progress Notes (Signed)
   THERAPIST PROGRESS NOTE  Session Time: 4:10-5pm  Participation Level: Active  Behavioral Response: CasualAlertAnxious  Type of Therapy: Individual Therapy  Treatment Goals addressed: Coping  Interventions: CBT  Summary: [name] [name] is a 62 y.o. female who presents for her individual counseling session. Pt presented anxious. She continues to struggle with grief of the death of her mother. Pt brought in her grief workbook and read from it. She was able to identify, explore and feel her emotions. Pt will continue to work in her grief workbook. Pt struggles with COPD but continues to smoke. Suggested smoking cessation [facility]. "I do not want to quit smoking." Pt brought in her journal where she had written some each day. Had pt read from her journal and then processed it. Asked open ended questions and used empathic reflection.         Suicidal/Homicidal: Nowithout intent/plan  Therapist Response: Assessed pt's current functioning and reviewed progress. Assisted pt processing her mother's death, grief workbook, journaling, feelings.. Assisted pt processing for the management of her stressors.  Plan: Return again in 2 weeks.   Diagnosis: Axis I: Bipolar 1 Disorder    Joevon Holliman S, LCAS 03/12/2018

## 2018-03-17 ENCOUNTER — Ambulatory Visit (INDEPENDENT_AMBULATORY_CARE_PROVIDER_SITE_OTHER): Payer: Self-pay

## 2018-03-17 ENCOUNTER — Encounter (HOSPITAL_COMMUNITY): Payer: Self-pay | Admitting: Emergency Medicine

## 2018-03-17 ENCOUNTER — Ambulatory Visit (HOSPITAL_COMMUNITY)
Admission: EM | Admit: 2018-03-17 | Discharge: 2018-03-17 | Disposition: A | Discharge status: Home / Self Care | Payer: Self-pay | Attending: Family Medicine | Admitting: Family Medicine

## 2018-03-17 ENCOUNTER — Telehealth: Payer: Self-pay | Admitting: Family Medicine

## 2018-03-17 DIAGNOSIS — S63294A Dislocation of distal interphalangeal joint of right ring finger, initial encounter: Secondary | ICD-10-CM

## 2018-03-17 DIAGNOSIS — Y92009 Unspecified place in unspecified non-institutional (private) residence as the place of occurrence of the external cause: Secondary | ICD-10-CM

## 2018-03-17 DIAGNOSIS — W19XXXA Unspecified fall, initial encounter: Secondary | ICD-10-CM

## 2018-03-17 DIAGNOSIS — S80212A Abrasion, left knee, initial encounter: Secondary | ICD-10-CM

## 2018-03-17 DIAGNOSIS — M25552 Pain in left hip: Secondary | ICD-10-CM

## 2018-03-17 MED ORDER — HYDROCODONE-ACETAMINOPHEN 5-325 MG PO TABS
1.0000 | ORAL_TABLET | Freq: Once | ORAL | Status: AC
  Administered 2018-03-17: 1 via ORAL

## 2018-03-17 MED ORDER — TETANUS-DIPHTH-ACELL PERTUSSIS 5-2.5-18.5 LF-MCG/0.5 IM SUSP
INTRAMUSCULAR | Status: AC
  Filled 2018-03-17: qty 0.5

## 2018-03-17 MED ORDER — HYDROCODONE-ACETAMINOPHEN 5-325 MG PO TABS
ORAL_TABLET | ORAL | Status: AC
  Filled 2018-03-17: qty 1

## 2018-03-17 MED ORDER — TETANUS-DIPHTH-ACELL PERTUSSIS 5-2.5-18.5 LF-MCG/0.5 IM SUSP
0.5000 mL | Freq: Once | INTRAMUSCULAR | Status: AC
  Administered 2018-03-17: 0.5 mL via INTRAMUSCULAR

## 2018-03-17 MED ORDER — HYDROCODONE-ACETAMINOPHEN 5-325 MG PO TABS: 1.0000 | ORAL_TABLET | ORAL | 0 refills | Status: DC | PRN
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
## 2018-03-17 NOTE — Discharge Instructions (Signed)
Elevate hand.  Use ice to reduce swelling. Continue to watch wounds for signs of infection. Call your doctor tomorrow to arrange a follow-up next week Wear splint as directed.

## 2018-03-17 NOTE — Telephone Encounter (Signed)
PT's pharmacy does not despense norco; called PT to make other arrangements; left a message

## 2018-03-18 DIAGNOSIS — S63294A Dislocation of distal interphalangeal joint of right ring finger, initial encounter: Secondary | ICD-10-CM

## 2018-03-18 NOTE — ED Provider Notes (Signed)
[facility]    CSN: [id number] Arrival date & time: 03/17/18  1345     History   Chief Complaint Chief Complaint  Patient presents with  . Fall    HPI [name] [name] is a 62 y.o. female.   HPI  Patient was pulled while walking her dog.  She fell forward on the steps.  She scraped her left knee.  She has some mild pain in her left hip and low back.  She is fully able to weight-bear.  Her biggest problem is pain in her right hand.  The ring finger on the right hand looks deformed and feels very painful.  The long finger has a small abrasion.  She is here for medical care.  She states that she was up last night with pain from her finger.  She washed all the other wounds and applied Neosporin ointment.  Did not hit her head.  Denies any head injury or other injuries from her fall. Past Medical History:  Diagnosis Date  . Anxiety    takes Lexapro daily  . Arthritis    "back, from neck down pass my bra area" (03/25/2017)  . Asthma   . Bartholin gland cyst 08/29/2011  . Bruises easily    pt is on Effient  . Chronic back pain    herniated nucleus pulposus  . Chronic back pain    "neck to bra area; lower back" (03/25/2017)  . COPD (chronic obstructive pulmonary disease) ([place])    early stages  . Coronary artery disease   . Depression    takes Klonopin daily  . Diabetes mellitus without complication ([place])   . Diverticulosis   . GERD (gastroesophageal reflux disease)    takes Nexium daily  . H/O hiatal hernia   . Heart attack ([place]) 2011  . Hemorrhoids   . Hernia   . Hyperlipidemia    takes Lipitor daily  . Hypertension    takes Losartan daily and Labetalol bid  . Hypothyroidism    takes Synthroid daily  . Insomnia    hydroxyzine prn  . Joint pain   . Pneumonia    "couple times" (03/25/2017)  . Pre-diabetes    "just found out 1 wk ago" (03/25/2017)  . Psoriasis    elbows,knees,back  . Shortness of breath    with exertion  . Slowing of urinary stream   .  Stress incontinence     Patient Active Problem List   Diagnosis Date Noted  . Insomnia 10/28/2017  . COPD with acute exacerbation ([place]) 09/15/2017  . Pre-diabetes 05/28/2017  . Emesis, persistent 05/28/2017  . QT prolongation 03/25/2017  . Encounter for screening mammogram for breast cancer 09/11/2015  . Psoriasis 06/22/2015  . COPD (chronic obstructive pulmonary disease) ([place]) 05/18/2015  . Anxiety and depression 07/10/2013  . Essential hypertension, benign 06/07/2013  . Dyslipidemia 06/07/2013  . Asthma, chronic 06/07/2013  . Emphysema lung ([place]) 06/07/2013  . Chronic back pain 06/07/2013  . CAD S/P percutaneous coronary angioplasty 06/07/2013  . GERD (gastroesophageal reflux disease) 06/07/2013  . Breast lump on left side at 3 o'clock position 10/13/2012  . S/P abdominal hysterectomy and right salpingo-oophorectomy 08/29/2011  . COPD exacerbation ([place]) 09/15/2009  . TOBACCO ABUSE 07/17/2009  . Chronic rhinitis 07/17/2009  . Lung nodule < 6cm on CT 07/17/2009  . ALLERGY, FOOD 07/17/2009  . Hypothyroidism 12/22/2008    Past Surgical History:  Procedure Laterality Date  . ABDOMINAL EXPLORATION SURGERY  1977  . ABDOMINAL HYSTERECTOMY  1977   "left one of my ovaries"  . BACK SURGERY    . COLONOSCOPY    . CORONARY ANGIOPLASTY WITH STENT PLACEMENT  2011 X2   "regular stents didn't work; had to go back in in ~ 1 month and put in medicated stents"  . DILATION AND CURETTAGE OF UTERUS    . ESOPHAGOGASTRODUODENOSCOPY    . LEFT HEART CATH AND CORONARY ANGIOGRAPHY N/A 03/26/2017   Procedure: LEFT HEART CATH AND CORONARY ANGIOGRAPHY;  Surgeon: Belva Crome, MD;  Location: [facility];  Service: Cardiovascular;  Laterality: N/A;  . LUMBAR LAMINECTOMY/DECOMPRESSION MICRODISCECTOMY  08/05/2011   Procedure: LUMBAR LAMINECTOMY/DECOMPRESSION MICRODISCECTOMY;  Surgeon: Otilio Connors, MD;  Location: [facility];  Service: Neurosurgery;  Laterality: Right;  Right Lumbar four-five  extraforaminal discectomy  . TONSILLECTOMY     as a child    OB History    Gravida  1   Para  1   Term  1   Preterm  0   AB  0   Living  1     SAB  0   TAB  0   Ectopic  0   Multiple  0   Live Births               Home Medications    Prior to Admission medications   Medication Sig Start Date End Date Taking? Authorizing Provider  albuterol (VENTOLIN HFA) 108 (90 Base) MCG/ACT inhaler Inhale 2 puffs every 6 (six) hours as needed into the lungs for wheezing or shortness of breath. 05/26/17   Tresa Garter, MD  aspirin EC 81 MG tablet Take 81 mg by mouth daily.    [provider]  atorvastatin (LIPITOR) 40 MG tablet Take 1 tablet (40 mg total) daily by mouth. Patient taking differently: Take 40 mg by mouth at bedtime.  05/28/17 11/24/17  Elsie Stain, MD  Blood Glucose Monitoring Suppl (TRUE METRIX METER) DEVI 1 kit by Does not apply route 4 (four) times daily. 04/04/17   Brayton Caves, PA-C  busPIRone (BUSPAR) 15 MG tablet Take 1 tablet (15 mg total) by mouth 3 (three) times daily. 02/10/18 02/10/19  Aundra Dubin, MD  Calcium 600 MG tablet Take 600 mg by mouth daily.  07/14/13   Patrecia Pour, NP  cyclobenzaprine (FLEXERIL) 10 MG tablet TAKE 1 TABLET BY MOUTH 3 TIMES DAILY AS NEEDED. 12/17/17   Ladell Pier, MD  escitalopram (LEXAPRO) 20 MG tablet Take 1 tablet (20 mg total) by mouth daily. 02/10/18   Aundra Dubin, MD  fluticasone (FLONASE) 50 MCG/ACT nasal spray Place 2 sprays into both nostrils daily. 09/18/17   Ladell Pier, MD  fluticasone furoate-vilanterol (BREO ELLIPTA) 200-25 MCG/INH AEPB Inhale 1 puff daily into the lungs. 05/28/17   Elsie Stain, MD  furosemide (LASIX) 20 MG tablet TAKE 1 TABLET BY MOUTH DAILY. 12/05/17   Charlott Rakes, MD  gabapentin (NEURONTIN) 600 MG tablet TAKE 1 TABLET BY MOUTH 4 TIMES DAILY 01/21/18   Ladell Pier, MD  glucose blood (TRUE METRIX BLOOD GLUCOSE TEST) test strip  Use as instructed 01/21/18   Ladell Pier, MD  HYDROcodone-acetaminophen (NORCO/VICODIN) 5-325 MG tablet Take 1 tablet by mouth every 4 (four) hours as needed. Take with food 03/17/18   Raylene Everts, MD  hydrOXYzine (VISTARIL) 25 MG capsule TAKE 25MG BY MOUTH THREE TIMES DAILY 05/28/17   Elsie Stain, MD  ipratropium-albuterol (DUONEB) 0.5-2.5 (3) MG/3ML  SOLN Take 3 mLs every 6 (six) hours as needed by nebulization. Patient taking differently: Take 3 mLs by nebulization every 6 (six) hours as needed (wheezing).  05/28/17   Elsie Stain, MD  labetalol (NORMODYNE) 100 MG tablet Take 1 tablet (100 mg total) by mouth 2 (two) times daily. 10/28/17   Ladell Pier, MD  levothyroxine (SYNTHROID, LEVOTHROID) 100 MCG tablet TAKE 1 TABLET  100 MCG TOTAL  DAILY BY MOUTH 10/22/17   [provider]  levothyroxine (SYNTHROID, LEVOTHROID) 75 MCG tablet Take 1 tablet (75 mcg total) by mouth daily. 09/21/17   Ladell Pier, MD  loratadine (CLARITIN) 10 MG tablet Take 1 tablet (10 mg total) by mouth daily. 09/18/17   Ladell Pier, MD  losartan (COZAAR) 50 MG tablet Take 1.5 tablets (75 mg total) by mouth daily. Patient taking differently: Take 75 mg by mouth at bedtime.  07/22/17   Ladell Pier, MD  mirtazapine (REMERON) 15 MG tablet Take 1 tablet (15 mg total) by mouth at bedtime. 02/10/18 02/10/19  Aundra Dubin, MD  montelukast (SINGULAIR) 10 MG tablet TAKE 1 TABLET BY MOUTH AT BEDTIME. 02/03/18   Ladell Pier, MD  naproxen sodium (ALEVE) 220 MG tablet Take 440 mg by mouth 2 (two) times daily.    [provider]  nicotine (NICODERM CQ - DOSED IN MG/24 HOURS) 21 mg/24hr patch Place 1 patch (21 mg total) onto the skin daily. 09/18/17   Ladell Pier, MD  omeprazole (PRILOSEC) 40 MG capsule Take 1 capsule (40 mg total) by mouth daily. 10/28/17   Ladell Pier, MD  ondansetron (ZOFRAN) 4 MG tablet Take 1 tablet (4 mg total) every 8 (eight) hours as  needed by mouth for nausea or vomiting. 05/28/17   Elsie Stain, MD  ticagrelor (BRILINTA) 90 MG TABS tablet Take 1 tablet (90 mg total) by mouth 2 (two) times daily. 10/28/17   Ladell Pier, MD  triamcinolone cream (KENALOG) 0.1 % APPLY 1 APPLICATION TOPICALLY 2 TIMES DAILY. 12/17/17   Ladell Pier, MD  TRUEPLUS LANCETS 28G MISC 28 g by Does not apply route QID. 04/04/17   Brayton Caves, PA-C  umeclidinium bromide (INCRUSE ELLIPTA) 62.5 MCG/INH AEPB Inhale 1 puff into the lungs daily. 09/18/17   Ladell Pier, MD    Family History Family History  Problem Relation Age of Onset  . Heart disease Mother   . Hypertension Mother   . Stroke Mother   . Mental illness Mother   . Heart disease Father   . Hypertension Father   . Diabetes Father   . Cancer Maternal Aunt        breast  . Thyroid disease Paternal Aunt   . Heart disease Maternal Grandfather   . Anesthesia problems Daughter   . Cancer Paternal Grandmother        mouth  . Hypotension Neg Hx   . Malignant hyperthermia Neg Hx   . Pseudochol deficiency Neg Hx     Social History Social History   Tobacco Use  . Smoking status: Current Every Day Smoker    Packs/day: 0.50    Years: 49.00    Pack years: 24.50    Types: Cigarettes  . Smokeless tobacco: Never Used  Substance Use Topics  . Alcohol use: Not Currently  . Drug use: No    Types: Cocaine    Comment: 03/25/2017 "used cocaine a couple days ago; smoke pot from time to time"  Allergies   Avocado; Latex; and Codeine   Review of Systems Review of Systems  Constitutional: Negative for chills and fever.  HENT: Negative for ear pain and sore throat.   Eyes: Negative for pain and visual disturbance.  Respiratory: Negative for cough and shortness of breath.   Cardiovascular: Negative for chest pain and palpitations.  Gastrointestinal: Negative for abdominal pain and vomiting.  Genitourinary: Negative for dysuria and hematuria.  Musculoskeletal:  Positive for joint swelling. Negative for arthralgias and back pain.  Skin: Positive for wound. Negative for color change and rash.  Neurological: Negative for seizures and syncope.  All other systems reviewed and are negative.    Physical Exam Triage Vital Signs ED Triage Vitals  Enc Vitals Group     BP 03/17/18 1430 136/78     Pulse Rate 03/17/18 1430 66     Resp 03/17/18 1430 16     Temp 03/17/18 1430 98 F (36.7 C)     Temp Source 03/17/18 1430 Oral     SpO2 03/17/18 1430 97 %     Weight 03/17/18 1428 120 lb (54.4 kg)     Height --      Head Circumference --      Peak Flow --      Pain Score 03/17/18 1428 10     Pain Loc --      Pain Edu? --      Excl. in [place]? --    No data found.  Updated Vital Signs BP 136/78   Pulse 66   Temp 98 F (36.7 C) (Oral)   Resp 16   Wt 54.4 kg   SpO2 97%   BMI 22.31 kg/m      Physical Exam  Constitutional: She appears well-developed and well-nourished. She appears distressed.  Pain cradling right hand  HENT:  Head: Normocephalic and atraumatic.  Mouth/Throat: Oropharynx is clear and moist.  Eyes: Pupils are equal, round, and reactive to light. Conjunctivae are normal.  Neck: Normal range of motion.  Cardiovascular: Normal rate, regular rhythm and normal heart sounds.  Pulmonary/Chest: Effort normal. No respiratory distress.  Abdominal: Soft. She exhibits no distension.  Musculoskeletal: Normal range of motion. She exhibits no edema.  Ring finger of right hand has a deformity at the DIP.  Very tender to touch.  Distal sensation is normal.  Neurological: She is alert.  Skin: Skin is warm and dry.  Superficial abrasions to left knee.  No bony tenderness.  Full range of motion.  Tip of long finger right hand has a small cut right at the nail.  No bleeding.  Psychiatric: She has a normal mood and affect. Her behavior is normal.     UC Treatments / Results  Labs (all labs ordered are listed, but only abnormal results are  displayed) Labs Reviewed - No data to display  EKG None  Radiology Dg Hand Complete Right  Result Date: 03/17/2018 CLINICAL DATA:  [place] going up stairs, injuring the right hand EXAM: RIGHT HAND - COMPLETE 3+ VIEW COMPARISON:  None. FINDINGS: There is dislocation of the right fourth DIP joint with the distal phalanx dorsal to the middle phalanx. No fracture is seen. No other abnormality is noted. IMPRESSION: Dislocation of the right fourth DIP joint.  No definite fracture. Electronically Signed   By: Ivar Drape M.D.   On: 03/17/2018 15:05   Dg Finger Ring Right  Result Date: 03/17/2018 CLINICAL DATA:  Status post reduction of ring finger dislocation. EXAM: RIGHT RING FINGER  2+V COMPARISON:  None. FINDINGS: The DIP joint has been reduced. A small avulsion fracture is present at the ventral base of the distal phalanx. No additional fractures are present. Extensive soft tissue swelling is present. IMPRESSION: 1. Interval reduction of DIP joint. 2. Minimally displaced avulsion fracture at the ventral base of the distal phalanx. 3. Extensive soft tissue swelling. Electronically Signed   By: San Morelle M.D.   On: 03/17/2018 16:03    Procedures Orthopedic Injury Treatment Date/Time: 03/18/2018 1:45 PM Performed by: Raylene Everts, MD Authorized by: Raylene Everts, MD   Consent:    Consent obtained:  Verbal   Consent given by:  Patient   Risks discussed:  Fracture and nerve damage   Alternatives discussed:  No treatment and delayed treatmentInjury location: finger Location details: right ring finger Injury type: dislocation Dislocation type: DIP Pre-procedure neurovascular assessment: neurovascularly intact Anesthesia: digital block  Anesthesia: Local anesthesia used: yes Local Anesthetic: lidocaine 2% without epinephrine Anesthetic total: 2 mL  Patient sedated: NoManipulation performed: yes Reduction successful: yes X-ray confirmed reduction: yes Immobilization:  splint Splint type: static finger Supplies used: aluminum splint Post-procedure neurovascular assessment: post-procedure neurovascularly intact Patient tolerance: Patient tolerated the procedure well with no immediate complications Comments: Experienced no pain during procedure.    (including critical care time)  Medications Ordered in UC Medications  HYDROcodone-acetaminophen (NORCO/VICODIN) 5-325 MG per tablet 1 tablet (1 tablet Oral Given 03/17/18 1517)  Tdap (BOOSTRIX) injection 0.5 mL (0.5 mLs Intramuscular Given 03/17/18 1551)    Initial Impression / Assessment and Plan / UC Course  I have reviewed the triage vital signs and the nursing notes.  Pertinent labs & imaging results that were available during my care of the patient were reviewed by me and considered in my medical decision making (see chart for details).     Discussed with patient that she did have a small fracture on the postreduction film.  This was shown to her.  The need to follow-up with her primary care provider for additional instructions and possible referral to orthopedist is emphasized that she and a friend who is with her. Final Clinical Impressions(s) / UC Diagnoses   Final diagnoses:  Dislocation of distal interphalangeal (DIP) joint of right ring finger, initial encounter  Abrasion, knee, left, initial encounter  Acute hip pain, left  Fall in home, initial encounter     Discharge Instructions     Elevate hand.  Use ice to reduce swelling. Continue to watch wounds for signs of infection. Call your doctor tomorrow to arrange a follow-up next week Wear splint as directed.   ED Prescriptions    Medication Sig Dispense Auth. Provider   HYDROcodone-acetaminophen (NORCO/VICODIN) 5-325 MG tablet Take 1 tablet by mouth every 4 (four) hours as needed. Take with food 12 tablet Raylene Everts, MD     Controlled Substance Prescriptions [place] Controlled Substance Registry consulted? Yes, I have consulted  the [place] Controlled Substances Registry for this patient, and feel the risk/benefit ratio today is favorable for proceeding with this prescription for a controlled substance.   Raylene Everts, MD 03/18/18 [phone]

## 2018-03-27 ENCOUNTER — Ambulatory Visit (HOSPITAL_COMMUNITY)
Admission: EM | Admit: 2018-03-27 | Discharge: 2018-03-27 | Disposition: A | Discharge status: Home / Self Care | Payer: Self-pay | Attending: Family Medicine | Admitting: Family Medicine

## 2018-03-27 ENCOUNTER — Other Ambulatory Visit: Payer: Self-pay | Admitting: Internal Medicine

## 2018-03-27 ENCOUNTER — Encounter (HOSPITAL_COMMUNITY): Payer: Self-pay

## 2018-03-27 DIAGNOSIS — R0789 Other chest pain: Secondary | ICD-10-CM

## 2018-03-27 DIAGNOSIS — R062 Wheezing: Secondary | ICD-10-CM

## 2018-03-27 DIAGNOSIS — J441 Chronic obstructive pulmonary disease with (acute) exacerbation: Secondary | ICD-10-CM

## 2018-03-27 DIAGNOSIS — R0602 Shortness of breath: Secondary | ICD-10-CM

## 2018-03-27 DIAGNOSIS — K219 Gastro-esophageal reflux disease without esophagitis: Secondary | ICD-10-CM

## 2018-03-27 DIAGNOSIS — I10 Essential (primary) hypertension: Secondary | ICD-10-CM

## 2018-03-27 MED ORDER — PREDNISONE 50 MG PO TABS: 50.0000 mg | ORAL_TABLET | Freq: Every day | ORAL | 0 refills | Status: DC

## 2018-03-27 MED ORDER — IPRATROPIUM-ALBUTEROL 0.5-2.5 (3) MG/3ML IN SOLN
3.0000 mL | Freq: Once | RESPIRATORY_TRACT | Status: AC
  Administered 2018-03-27: 3 mL via RESPIRATORY_TRACT

## 2018-03-27 MED ORDER — METHYLPREDNISOLONE SODIUM SUCC 125 MG IJ SOLR
80.0000 mg | Freq: Once | INTRAMUSCULAR | Status: AC
  Administered 2018-03-27: 80 mg via INTRAMUSCULAR

## 2018-03-27 MED ORDER — IPRATROPIUM-ALBUTEROL 0.5-2.5 (3) MG/3ML IN SOLN
RESPIRATORY_TRACT | Status: AC
  Filled 2018-03-27: qty 3

## 2018-03-27 MED ORDER — METHYLPREDNISOLONE SODIUM SUCC 125 MG IJ SOLR
INTRAMUSCULAR | Status: AC
  Filled 2018-03-27: qty 2

## 2018-03-27 MED ORDER — DOXYCYCLINE HYCLATE 100 MG PO CAPS: 100.0000 mg | ORAL_CAPSULE | Freq: Two times a day (BID) | ORAL | 0 refills | Status: DC

## 2018-03-27 MED FILL — ?FUROSEMIDE 20 MG TABLET: 20 | 30 days supply | Qty: 30 | Fill #3

## 2018-03-27 MED FILL — LABETALOL HCL 100 MG TABS: 100 | 30 days supply | Qty: 60 | Fill #3

## 2018-03-27 MED FILL — predniSONE 10 MG TABS: 10 | 5 days supply | Qty: 25 | Fill #0

## 2018-03-27 MED FILL — ?ATORVASTATIN 40MG TABLET: 40 | 30 days supply | Qty: 30 | Fill #8

## 2018-03-27 MED FILL — ?MONTELUKAST SODI 10MG TAB: 10 | 30 days supply | Qty: 30 | Fill #1

## 2018-03-27 MED FILL — GABAPENTIN 600 MG TABLET: 600 | 30 days supply | Qty: 120 | Fill #2

## 2018-03-27 MED FILL — DOXYCYCLINE HYCLATE 100 MG: 100 | 10 days supply | Qty: 20 | Fill #0

## 2018-03-27 MED FILL — $BREO ELLIPTA 200-25 MCG IN: 200-25 | 30 days supply | Qty: 60 | Fill #3

## 2018-03-27 MED FILL — LEVOTHYROXINE 100 MCG TAB: 100 | 30 days supply | Qty: 30 | Fill #0

## 2018-03-27 MED FILL — !VENTOLIN HFA INHALER: 108 (90 BAS | 18 days supply | Qty: 18 | Fill #0

## 2018-03-27 NOTE — ED Triage Notes (Signed)
Pt presents with shortness of breath Has a history of COPD

## 2018-03-27 NOTE — Discharge Instructions (Signed)
Solumedrol injection in office today. Start prednisone and doxycycline as directed. Continue your inhalers as directed. As discussed, decreasing/stopping smoking will help with the symptoms. Monitor for worsening of symptoms chest pain, worsening shortness of breath/wheezing, leg swelling, weakness/dizziness, go to the emergency department for further evaluation needed.

## 2018-03-27 NOTE — ED Provider Notes (Signed)
[facility]    CSN: [id number] Arrival date & time: 03/27/18  1421     History   Chief Complaint Chief Complaint  Patient presents with  . Shortness of Breath    HPI [name] [name] is a 62 y.o. female.   62 year old female comes in for 1 week history of shortness of breath and wheezing. States history of COPD, and thinks she has an exacerbation. She states started with URI symptoms with rhinorrhea, nasal congestion, cough. Denies fever, chills, night sweats. She has been using albuterol as needed with good improvement. States symptoms worsened 2 days ago and now with chest tightness, and no relief with albuterol. She has had worsening productive cough. Denies chest pain, palpitations, leg swelling. Current every day smoker.      Past Medical History:  Diagnosis Date  . Anxiety    takes Lexapro daily  . Arthritis    "back, from neck down pass my bra area" (03/25/2017)  . Asthma   . Bartholin gland cyst 08/29/2011  . Bruises easily    pt is on Effient  . Chronic back pain    herniated nucleus pulposus  . Chronic back pain    "neck to bra area; lower back" (03/25/2017)  . COPD (chronic obstructive pulmonary disease) ([place])    early stages  . Coronary artery disease   . Depression    takes Klonopin daily  . Diabetes mellitus without complication ([place])   . Diverticulosis   . GERD (gastroesophageal reflux disease)    takes Nexium daily  . H/O hiatal hernia   . Heart attack ([place]) 2011  . Hemorrhoids   . Hernia   . Hyperlipidemia    takes Lipitor daily  . Hypertension    takes Losartan daily and Labetalol bid  . Hypothyroidism    takes Synthroid daily  . Insomnia    hydroxyzine prn  . Joint pain   . Pneumonia    "couple times" (03/25/2017)  . Pre-diabetes    "just found out 1 wk ago" (03/25/2017)  . Psoriasis    elbows,knees,back  . Shortness of breath    with exertion  . Slowing of urinary stream   . Stress incontinence     Patient Active Problem  List   Diagnosis Date Noted  . Insomnia 10/28/2017  . COPD with acute exacerbation ([place]) 09/15/2017  . Pre-diabetes 05/28/2017  . Emesis, persistent 05/28/2017  . QT prolongation 03/25/2017  . Encounter for screening mammogram for breast cancer 09/11/2015  . Psoriasis 06/22/2015  . COPD (chronic obstructive pulmonary disease) ([place]) 05/18/2015  . Anxiety and depression 07/10/2013  . Essential hypertension, benign 06/07/2013  . Dyslipidemia 06/07/2013  . Asthma, chronic 06/07/2013  . Emphysema lung ([place]) 06/07/2013  . Chronic back pain 06/07/2013  . CAD S/P percutaneous coronary angioplasty 06/07/2013  . GERD (gastroesophageal reflux disease) 06/07/2013  . Breast lump on left side at 3 o'clock position 10/13/2012  . S/P abdominal hysterectomy and right salpingo-oophorectomy 08/29/2011  . COPD exacerbation ([place]) 09/15/2009  . TOBACCO ABUSE 07/17/2009  . Chronic rhinitis 07/17/2009  . Lung nodule < 6cm on CT 07/17/2009  . ALLERGY, FOOD 07/17/2009  . Hypothyroidism 12/22/2008    Past Surgical History:  Procedure Laterality Date  . ABDOMINAL EXPLORATION SURGERY  1977  . ABDOMINAL HYSTERECTOMY  1977   "left one of my ovaries"  . BACK SURGERY    . COLONOSCOPY    . CORONARY ANGIOPLASTY WITH STENT PLACEMENT  2011 X2   "regular  stents didn't work; had to go back in in ~ 1 month and put in medicated stents"  . DILATION AND CURETTAGE OF UTERUS    . ESOPHAGOGASTRODUODENOSCOPY    . LEFT HEART CATH AND CORONARY ANGIOGRAPHY N/A 03/26/2017   Procedure: LEFT HEART CATH AND CORONARY ANGIOGRAPHY;  Surgeon: Belva Crome, MD;  Location: [facility];  Service: Cardiovascular;  Laterality: N/A;  . LUMBAR LAMINECTOMY/DECOMPRESSION MICRODISCECTOMY  08/05/2011   Procedure: LUMBAR LAMINECTOMY/DECOMPRESSION MICRODISCECTOMY;  Surgeon: Otilio Connors, MD;  Location: [facility];  Service: Neurosurgery;  Laterality: Right;  Right Lumbar four-five extraforaminal discectomy  . TONSILLECTOMY     as a  child    OB History    Gravida  1   Para  1   Term  1   Preterm  0   AB  0   Living  1     SAB  0   TAB  0   Ectopic  0   Multiple  0   Live Births               Home Medications    Prior to Admission medications   Medication Sig Start Date End Date Taking? Authorizing Provider  albuterol (VENTOLIN HFA) 108 (90 Base) MCG/ACT inhaler Inhale 2 puffs every 6 (six) hours as needed into the lungs for wheezing or shortness of breath. 05/26/17   Tresa Garter, MD  aspirin EC 81 MG tablet Take 81 mg by mouth daily.    [provider]  atorvastatin (LIPITOR) 40 MG tablet Take 1 tablet (40 mg total) daily by mouth. Patient taking differently: Take 40 mg by mouth at bedtime.  05/28/17 11/24/17  Elsie Stain, MD  Blood Glucose Monitoring Suppl (TRUE METRIX METER) DEVI 1 kit by Does not apply route 4 (four) times daily. 04/04/17   Brayton Caves, PA-C  busPIRone (BUSPAR) 15 MG tablet Take 1 tablet (15 mg total) by mouth 3 (three) times daily. 02/10/18 02/10/19  Aundra Dubin, MD  Calcium 600 MG tablet Take 600 mg by mouth daily.  07/14/13   Patrecia Pour, NP  cyclobenzaprine (FLEXERIL) 10 MG tablet TAKE 1 TABLET BY MOUTH 3 TIMES DAILY AS NEEDED. 12/17/17   Ladell Pier, MD  doxycycline (VIBRAMYCIN) 100 MG capsule Take 1 capsule (100 mg total) by mouth 2 (two) times daily. 03/27/18   Tasia Catchings, Daneka Lantigua V, PA-C  escitalopram (LEXAPRO) 20 MG tablet Take 1 tablet (20 mg total) by mouth daily. 02/10/18   Aundra Dubin, MD  fluticasone (FLONASE) 50 MCG/ACT nasal spray Place 2 sprays into both nostrils daily. 09/18/17   Ladell Pier, MD  fluticasone furoate-vilanterol (BREO ELLIPTA) 200-25 MCG/INH AEPB Inhale 1 puff daily into the lungs. 05/28/17   Elsie Stain, MD  furosemide (LASIX) 20 MG tablet TAKE 1 TABLET BY MOUTH DAILY. 12/05/17   Charlott Rakes, MD  gabapentin (NEURONTIN) 600 MG tablet TAKE 1 TABLET BY MOUTH 4 TIMES DAILY 01/21/18   Ladell Pier, MD  glucose blood (TRUE METRIX BLOOD GLUCOSE TEST) test strip Use as instructed 01/21/18   Ladell Pier, MD  HYDROcodone-acetaminophen (NORCO/VICODIN) 5-325 MG tablet Take 1 tablet by mouth every 4 (four) hours as needed. Take with food 03/17/18   Raylene Everts, MD  hydrOXYzine (VISTARIL) 25 MG capsule TAKE 25MG BY MOUTH THREE TIMES DAILY 05/28/17   Elsie Stain, MD  ipratropium-albuterol (DUONEB) 0.5-2.5 (3) MG/3ML SOLN Take 3 mLs every 6 (six)  hours as needed by nebulization. Patient taking differently: Take 3 mLs by nebulization every 6 (six) hours as needed (wheezing).  05/28/17   Elsie Stain, MD  labetalol (NORMODYNE) 100 MG tablet Take 1 tablet (100 mg total) by mouth 2 (two) times daily. 10/28/17   Ladell Pier, MD  levothyroxine (SYNTHROID, LEVOTHROID) 100 MCG tablet TAKE 1 TABLET  100 MCG TOTAL  DAILY BY MOUTH 10/22/17   [provider]  levothyroxine (SYNTHROID, LEVOTHROID) 75 MCG tablet Take 1 tablet (75 mcg total) by mouth daily. 09/21/17   Ladell Pier, MD  loratadine (CLARITIN) 10 MG tablet Take 1 tablet (10 mg total) by mouth daily. 09/18/17   Ladell Pier, MD  losartan (COZAAR) 50 MG tablet Take 1.5 tablets (75 mg total) by mouth daily. Patient taking differently: Take 75 mg by mouth at bedtime.  07/22/17   Ladell Pier, MD  mirtazapine (REMERON) 15 MG tablet Take 1 tablet (15 mg total) by mouth at bedtime. 02/10/18 02/10/19  Aundra Dubin, MD  montelukast (SINGULAIR) 10 MG tablet TAKE 1 TABLET BY MOUTH AT BEDTIME. 02/03/18   Ladell Pier, MD  naproxen sodium (ALEVE) 220 MG tablet Take 440 mg by mouth 2 (two) times daily.    [provider]  nicotine (NICODERM CQ - DOSED IN MG/24 HOURS) 21 mg/24hr patch Place 1 patch (21 mg total) onto the skin daily. 09/18/17   Ladell Pier, MD  omeprazole (PRILOSEC) 40 MG capsule Take 1 capsule (40 mg total) by mouth daily. 10/28/17   Ladell Pier, MD  ondansetron  (ZOFRAN) 4 MG tablet Take 1 tablet (4 mg total) every 8 (eight) hours as needed by mouth for nausea or vomiting. 05/28/17   Elsie Stain, MD  predniSONE (DELTASONE) 50 MG tablet Take 1 tablet (50 mg total) by mouth daily for 5 days. 03/27/18 04/01/18  Ok Edwards, PA-C  ticagrelor (BRILINTA) 90 MG TABS tablet Take 1 tablet (90 mg total) by mouth 2 (two) times daily. 10/28/17   Ladell Pier, MD  triamcinolone cream (KENALOG) 0.1 % APPLY 1 APPLICATION TOPICALLY 2 TIMES DAILY. 12/17/17   Ladell Pier, MD  TRUEPLUS LANCETS 28G MISC 28 g by Does not apply route QID. 04/04/17   Brayton Caves, PA-C  umeclidinium bromide (INCRUSE ELLIPTA) 62.5 MCG/INH AEPB Inhale 1 puff into the lungs daily. 09/18/17   Ladell Pier, MD    Family History Family History  Problem Relation Age of Onset  . Heart disease Mother   . Hypertension Mother   . Stroke Mother   . Mental illness Mother   . Heart disease Father   . Hypertension Father   . Diabetes Father   . Cancer Maternal Aunt        breast  . Thyroid disease Paternal Aunt   . Heart disease Maternal Grandfather   . Anesthesia problems Daughter   . Cancer Paternal Grandmother        mouth  . Hypotension Neg Hx   . Malignant hyperthermia Neg Hx   . Pseudochol deficiency Neg Hx     Social History Social History   Tobacco Use  . Smoking status: Current Every Day Smoker    Packs/day: 0.50    Years: 49.00    Pack years: 24.50    Types: Cigarettes  . Smokeless tobacco: Never Used  Substance Use Topics  . Alcohol use: Not Currently  . Drug use: No    Types: Cocaine  Comment: 03/25/2017 "used cocaine a couple days ago; smoke pot from time to time"     Allergies   Avocado; Latex; and Codeine   Review of Systems Review of Systems  Reason unable to perform ROS: See HPI as above.     Physical Exam Triage Vital Signs ED Triage Vitals  Enc Vitals Group     BP 03/27/18 1448 133/66     Pulse Rate 03/27/18 1448 74     Resp  03/27/18 1448 18     Temp 03/27/18 1448 98.8 F (37.1 C)     Temp Source 03/27/18 1448 Oral     SpO2 03/27/18 1448 100 %     Weight --      Height --      Head Circumference --      Peak Flow --      Pain Score 03/27/18 1449 0     Pain Loc --      Pain Edu? --      Excl. in [place]? --    No data found.  Updated Vital Signs BP 133/66 (BP Location: Right Arm)   Pulse 74   Temp 98.8 F (37.1 C) (Oral)   Resp 18   SpO2 100%   Physical Exam  Constitutional: She is oriented to person, place, and time. She appears well-developed and well-nourished.  Non-toxic appearance. She does not appear ill. No distress.  HENT:  Head: Normocephalic and atraumatic.  Right Ear: Tympanic membrane, external ear and ear canal normal. Tympanic membrane is not erythematous and not bulging.  Left Ear: Tympanic membrane, external ear and ear canal normal. Tympanic membrane is not erythematous and not bulging.  Nose: Nose normal. Right sinus exhibits no maxillary sinus tenderness and no frontal sinus tenderness. Left sinus exhibits no maxillary sinus tenderness and no frontal sinus tenderness.  Mouth/Throat: Uvula is midline, oropharynx is clear and moist and mucous membranes are normal.  Eyes: Pupils are equal, round, and reactive to light. Conjunctivae are normal.  Neck: Normal range of motion. Neck supple.  Cardiovascular: Normal rate, regular rhythm and normal heart sounds. Exam reveals no gallop and no friction rub.  No murmur heard. Pulmonary/Chest:  Increased work with breathing without respiratory distress, accessory muscle use. Patient still speaking in full sentences without difficulty. Diffuse rhonchi with inspiratory and expiratory wheezing. Decreased air movement.   Lymphadenopathy:    She has no cervical adenopathy.  Neurological: She is alert and oriented to person, place, and time.  Skin: Skin is warm and dry.  Psychiatric: She has a normal mood and affect. Her behavior is normal. Judgment  normal.     UC Treatments / Results  Labs (all labs ordered are listed, but only abnormal results are displayed) Labs Reviewed - No data to display  EKG None  Radiology No results found.  Procedures Procedures (including critical care time)  Medications Ordered in UC Medications  ipratropium-albuterol (DUONEB) 0.5-2.5 (3) MG/3ML nebulizer solution 3 mL (3 mLs Nebulization Given 03/27/18 1533)  methylPREDNISolone sodium succinate (SOLU-MEDROL) 125 mg/2 mL injection 80 mg (80 mg Intramuscular Given 03/27/18 1533)    Initial Impression / Assessment and Plan / UC Course  I have reviewed the triage vital signs and the nursing notes.  Pertinent labs & imaging results that were available during my care of the patient were reviewed by me and considered in my medical decision making (see chart for details).    Patient states much improved symptoms after DuoNeb.  Lungs continues with inspiratory  expiratory wheezing, no longer with rhonchi, better air movement.  After second DuoNeb, patient states has DuoNeb at home, and will return home for second treatment if needed.  Patient continues to speak in full sentences, breathing effort now normal, and stable to discharge home with DuoNeb treatment.  Solu-Medrol injection in office today.  Start doxycycline and prednisone as directed for COPD exacerbation. Smoking cessation discussed.  Strict return precautions given. Patient expresses understanding and agrees to plan.  Final Clinical Impressions(s) / UC Diagnoses   Final diagnoses:  COPD exacerbation [facility])    ED Prescriptions    Medication Sig Dispense Auth. Provider   predniSONE (DELTASONE) 50 MG tablet Take 1 tablet (50 mg total) by mouth daily for 5 days. 5 tablet Aaria Happ V, PA-C   doxycycline (VIBRAMYCIN) 100 MG capsule Take 1 capsule (100 mg total) by mouth 2 (two) times daily. 20 capsule Tobin Chad, [place] 03/27/18 1551

## 2018-03-30 ENCOUNTER — Inpatient Hospital Stay: Payer: Self-pay | Admitting: Family Medicine

## 2018-03-30 MED FILL — LOSARTAN POTASSIUM 50 MG TA: 50 | 30 days supply | Qty: 45 | Fill #0

## 2018-03-30 MED FILL — OMEPRAZOLE DR 40 MG CAPSULE: 40 | 30 days supply | Qty: 30 | Fill #0

## 2018-04-02 ENCOUNTER — Telehealth (HOSPITAL_COMMUNITY): Payer: Self-pay | Admitting: Emergency Medicine

## 2018-04-02 ENCOUNTER — Other Ambulatory Visit: Payer: Self-pay | Admitting: Internal Medicine

## 2018-04-02 NOTE — Telephone Encounter (Signed)
Pt here requesting resend of narcotics RX; [facility] stated since narcotic and 2 weeks of time that we were unable to resend at present

## 2018-04-03 MED FILL — CYCLOBENZAPRINE 10 MG TAB: 10 | 10 days supply | Qty: 30 | Fill #0

## 2018-04-09 ENCOUNTER — Ambulatory Visit (HOSPITAL_COMMUNITY): Payer: Self-pay | Admitting: Licensed Clinical Social Worker

## 2018-04-23 ENCOUNTER — Ambulatory Visit (HOSPITAL_COMMUNITY): Payer: Self-pay | Admitting: Licensed Clinical Social Worker

## 2018-04-30 ENCOUNTER — Ambulatory Visit (INDEPENDENT_AMBULATORY_CARE_PROVIDER_SITE_OTHER): Payer: Self-pay

## 2018-04-30 ENCOUNTER — Encounter (HOSPITAL_COMMUNITY): Payer: Self-pay | Admitting: Emergency Medicine

## 2018-04-30 ENCOUNTER — Ambulatory Visit (HOSPITAL_COMMUNITY)
Admission: EM | Admit: 2018-04-30 | Discharge: 2018-04-30 | Disposition: A | Discharge status: Home / Self Care | Payer: Self-pay | Attending: Family Medicine | Admitting: Family Medicine

## 2018-04-30 ENCOUNTER — Other Ambulatory Visit: Payer: Self-pay | Admitting: Family Medicine

## 2018-04-30 ENCOUNTER — Other Ambulatory Visit: Payer: Self-pay | Admitting: Physician Assistant

## 2018-04-30 ENCOUNTER — Other Ambulatory Visit: Payer: Self-pay | Admitting: Internal Medicine

## 2018-04-30 DIAGNOSIS — E039 Hypothyroidism, unspecified: Secondary | ICD-10-CM

## 2018-04-30 DIAGNOSIS — I10 Essential (primary) hypertension: Secondary | ICD-10-CM

## 2018-04-30 DIAGNOSIS — J441 Chronic obstructive pulmonary disease with (acute) exacerbation: Secondary | ICD-10-CM

## 2018-04-30 DIAGNOSIS — J320 Chronic maxillary sinusitis: Secondary | ICD-10-CM

## 2018-04-30 MED ORDER — BENZONATATE 100 MG PO CAPS: 100.0000 mg | ORAL_CAPSULE | Freq: Three times a day (TID) | ORAL | 0 refills | Status: DC

## 2018-04-30 MED ORDER — IPRATROPIUM-ALBUTEROL 0.5-2.5 (3) MG/3ML IN SOLN
RESPIRATORY_TRACT | Status: AC
  Filled 2018-04-30: qty 3

## 2018-04-30 MED ORDER — LEVOTHYROXINE SODIUM 75 MCG PO TABS: 75.0000 ug | ORAL_TABLET | Freq: Every day | ORAL | 1 refills | Status: DC

## 2018-04-30 MED ORDER — IPRATROPIUM-ALBUTEROL 0.5-2.5 (3) MG/3ML IN SOLN
3.0000 mL | Freq: Once | RESPIRATORY_TRACT | Status: AC
  Administered 2018-04-30: 3 mL via RESPIRATORY_TRACT

## 2018-04-30 MED ORDER — AMOXICILLIN-POT CLAVULANATE 875-125 MG PO TABS: 1.0000 | ORAL_TABLET | Freq: Two times a day (BID) | ORAL | 0 refills | Status: DC

## 2018-04-30 MED ORDER — PREDNISONE 10 MG (21) PO TBPK: ORAL_TABLET | ORAL | 0 refills | Status: DC

## 2018-04-30 MED FILL — GABAPENTIN 600 MG TABLET: 600 | 30 days supply | Qty: 120 | Fill #3

## 2018-04-30 MED FILL — BENZONATATE 100 MG CAP: 100 | 7 days supply | Qty: 21 | Fill #0

## 2018-04-30 MED FILL — MONTELUKAST SOD 10 MG TAB: 10 | 30 days supply | Qty: 30 | Fill #2

## 2018-04-30 MED FILL — ATORVASTATIN CALCIUM 40 MG: 40 | 30 days supply | Qty: 30 | Fill #9

## 2018-04-30 MED FILL — ALBUTEROL SULFATE HFA 108 (: 108 (90 BAS | 25 days supply | Qty: 18 | Fill #0

## 2018-04-30 MED FILL — AMOX-CLAV 875-125 MG TABLET: 875-125 | 7 days supply | Qty: 14 | Fill #0

## 2018-04-30 MED FILL — OMEPRAZOLE DR 40 MG CAPSULE: 40 | 30 days supply | Qty: 30 | Fill #1

## 2018-04-30 MED FILL — LABETALOL HCL 100 MG TABS: 100 | 30 days supply | Qty: 60 | Fill #4

## 2018-04-30 MED FILL — LOSARTAN POTASSIUM 50 MG TA: 50 | 30 days supply | Qty: 45 | Fill #1

## 2018-04-30 MED FILL — CYCLOBENZAPRINE 10 MG TAB: 10 | 10 days supply | Qty: 30 | Fill #0

## 2018-04-30 MED FILL — FUROSEMIDE 20 MG TABLET: 20 | 30 days supply | Qty: 30 | Fill #0

## 2018-04-30 MED FILL — predniSONE 10 MG TABS: 10 | 6 days supply | Qty: 21 | Fill #0

## 2018-04-30 NOTE — Telephone Encounter (Signed)
Unclear as to which dose pt is taking. Will route to PCP.

## 2018-04-30 NOTE — Discharge Instructions (Addendum)
Your x-ray was negative for pneumonia.  This is a COPD exacerbation Duo neb given in the clinic We will treat your sinus infection with Augmentin Benzonatate for cough Prednisone  taper for lung inflammation Continue your inhaler as needed.  Continue the allergy medication and Flonase.  Follow up as needed for continued or worsening symptoms

## 2018-04-30 NOTE — ED Triage Notes (Signed)
Pt c/o cough x1 week. Hx of copd.

## 2018-04-30 NOTE — ED Provider Notes (Signed)
[place]    CSN: [id number] Arrival date & time: 04/30/18  1113     History   Chief Complaint Chief Complaint  Patient presents with  . Cough    HPI [name] [name] is a 62 y.o. female.   Patient is a 62 year old female past medical history of COPD that presents with approximately 1 week of cough, congestion, rhinorrhea, rhinitis, sinus tenderness, mild shortness of breath with wheezing.  Her symptoms have been constant and worsening.  She has had mild body aches and felt warm to touch.  She has had increasing sputum production along with increased work of breathing.  She has been using Flonase, Singulair, albuterol nebulizer and her daily steroids for treatment.  This has  given her minimal relief of symptoms.   ROS per HPI      Past Medical History:  Diagnosis Date  . Anxiety    takes Lexapro daily  . Arthritis    "back, from neck down pass my bra area" (03/25/2017)  . Asthma   . Bartholin gland cyst 08/29/2011  . Bruises easily    pt is on Effient  . Chronic back pain    herniated nucleus pulposus  . Chronic back pain    "neck to bra area; lower back" (03/25/2017)  . COPD (chronic obstructive pulmonary disease) ([place])    early stages  . Coronary artery disease   . Depression    takes Klonopin daily  . Diabetes mellitus without complication ([place])   . Diverticulosis   . GERD (gastroesophageal reflux disease)    takes Nexium daily  . H/O hiatal hernia   . Heart attack ([place]) 2011  . Hemorrhoids   . Hernia   . Hyperlipidemia    takes Lipitor daily  . Hypertension    takes Losartan daily and Labetalol bid  . Hypothyroidism    takes Synthroid daily  . Insomnia    hydroxyzine prn  . Joint pain   . Pneumonia    "couple times" (03/25/2017)  . Pre-diabetes    "just found out 1 wk ago" (03/25/2017)  . Psoriasis    elbows,knees,back  . Shortness of breath    with exertion  . Slowing of urinary stream   . Stress incontinence     Patient Active  Problem List   Diagnosis Date Noted  . Insomnia 10/28/2017  . COPD with acute exacerbation ([place]) 09/15/2017  . Pre-diabetes 05/28/2017  . Emesis, persistent 05/28/2017  . QT prolongation 03/25/2017  . Encounter for screening mammogram for breast cancer 09/11/2015  . Psoriasis 06/22/2015  . COPD (chronic obstructive pulmonary disease) ([place]) 05/18/2015  . Anxiety and depression 07/10/2013  . Essential hypertension, benign 06/07/2013  . Dyslipidemia 06/07/2013  . Asthma, chronic 06/07/2013  . Emphysema lung ([place]) 06/07/2013  . Chronic back pain 06/07/2013  . CAD S/P percutaneous coronary angioplasty 06/07/2013  . GERD (gastroesophageal reflux disease) 06/07/2013  . Breast lump on left side at 3 o'clock position 10/13/2012  . S/P abdominal hysterectomy and right salpingo-oophorectomy 08/29/2011  . COPD exacerbation ([place]) 09/15/2009  . TOBACCO ABUSE 07/17/2009  . Chronic rhinitis 07/17/2009  . Lung nodule < 6cm on CT 07/17/2009  . ALLERGY, FOOD 07/17/2009  . Hypothyroidism 12/22/2008    Past Surgical History:  Procedure Laterality Date  . ABDOMINAL EXPLORATION SURGERY  1977  . ABDOMINAL HYSTERECTOMY  1977   "left one of my ovaries"  . BACK SURGERY    . COLONOSCOPY    . CORONARY ANGIOPLASTY WITH STENT PLACEMENT  2011 X2   "regular stents didn't work; had to go back in in ~ 1 month and put in medicated stents"  . DILATION AND CURETTAGE OF UTERUS    . ESOPHAGOGASTRODUODENOSCOPY    . LEFT HEART CATH AND CORONARY ANGIOGRAPHY N/A 03/26/2017   Procedure: LEFT HEART CATH AND CORONARY ANGIOGRAPHY;  Surgeon: Belva Crome, MD;  Location: [facility];  Service: Cardiovascular;  Laterality: N/A;  . LUMBAR LAMINECTOMY/DECOMPRESSION MICRODISCECTOMY  08/05/2011   Procedure: LUMBAR LAMINECTOMY/DECOMPRESSION MICRODISCECTOMY;  Surgeon: Otilio Connors, MD;  Location: [facility];  Service: Neurosurgery;  Laterality: Right;  Right Lumbar four-five extraforaminal discectomy  . TONSILLECTOMY      as a child    OB History    Gravida  1   Para  1   Term  1   Preterm  0   AB  0   Living  1     SAB  0   TAB  0   Ectopic  0   Multiple  0   Live Births               Home Medications    Prior to Admission medications   Medication Sig Start Date End Date Taking? Authorizing Provider  albuterol (VENTOLIN HFA) 108 (90 Base) MCG/ACT inhaler Inhale 2 puffs every 6 (six) hours as needed into the lungs for wheezing or shortness of breath. 05/26/17   Tresa Garter, MD  amoxicillin-clavulanate (AUGMENTIN) 875-125 MG tablet Take 1 tablet by mouth every 12 (twelve) hours. 04/30/18   Loura Halt A, NP  aspirin EC 81 MG tablet Take 81 mg by mouth daily.    [provider]  atorvastatin (LIPITOR) 40 MG tablet Take 1 tablet (40 mg total) daily by mouth. Patient taking differently: Take 40 mg by mouth at bedtime.  05/28/17 11/24/17  Elsie Stain, MD  benzonatate (TESSALON) 100 MG capsule Take 1 capsule (100 mg total) by mouth every 8 (eight) hours. 04/30/18   Loura Halt A, NP  Blood Glucose Monitoring Suppl (TRUE METRIX METER) DEVI 1 kit by Does not apply route 4 (four) times daily. 04/04/17   Brayton Caves, PA-C  busPIRone (BUSPAR) 15 MG tablet Take 1 tablet (15 mg total) by mouth 3 (three) times daily. 02/10/18 02/10/19  Aundra Dubin, MD  Calcium 600 MG tablet Take 600 mg by mouth daily.  07/14/13   Patrecia Pour, NP  cyclobenzaprine (FLEXERIL) 10 MG tablet TAKE 1 TABLET BY MOUTH 3 TIMES DAILY AS NEEDED. 04/03/18   Ladell Pier, MD  doxycycline (VIBRAMYCIN) 100 MG capsule Take 1 capsule (100 mg total) by mouth 2 (two) times daily. 03/27/18   Tasia Catchings, Amy V, PA-C  escitalopram (LEXAPRO) 20 MG tablet Take 1 tablet (20 mg total) by mouth daily. 02/10/18   Aundra Dubin, MD  fluticasone (FLONASE) 50 MCG/ACT nasal spray Place 2 sprays into both nostrils daily. 09/18/17   Ladell Pier, MD  fluticasone furoate-vilanterol (BREO ELLIPTA) 200-25  MCG/INH AEPB Inhale 1 puff daily into the lungs. 05/28/17   Elsie Stain, MD  furosemide (LASIX) 20 MG tablet TAKE 1 TABLET BY MOUTH DAILY. 12/05/17   Charlott Rakes, MD  gabapentin (NEURONTIN) 600 MG tablet TAKE 1 TABLET BY MOUTH 4 TIMES DAILY 01/21/18   Ladell Pier, MD  glucose blood (TRUE METRIX BLOOD GLUCOSE TEST) test strip Use as instructed 01/21/18   Ladell Pier, MD  HYDROcodone-acetaminophen (NORCO/VICODIN) 5-325 MG tablet Take 1  tablet by mouth every 4 (four) hours as needed. Take with food 03/17/18   Raylene Everts, MD  hydrOXYzine (VISTARIL) 25 MG capsule TAKE 25MG BY MOUTH THREE TIMES DAILY 05/28/17   Elsie Stain, MD  ipratropium-albuterol (DUONEB) 0.5-2.5 (3) MG/3ML SOLN Take 3 mLs every 6 (six) hours as needed by nebulization. Patient taking differently: Take 3 mLs by nebulization every 6 (six) hours as needed (wheezing).  05/28/17   Elsie Stain, MD  labetalol (NORMODYNE) 100 MG tablet Take 1 tablet (100 mg total) by mouth 2 (two) times daily. 10/28/17   Ladell Pier, MD  levothyroxine (SYNTHROID, LEVOTHROID) 100 MCG tablet TAKE 1 TABLET  100 MCG TOTAL  DAILY BY MOUTH 10/22/17   [provider]  levothyroxine (SYNTHROID, LEVOTHROID) 75 MCG tablet Take 1 tablet (75 mcg total) by mouth daily. 09/21/17   Ladell Pier, MD  loratadine (CLARITIN) 10 MG tablet Take 1 tablet (10 mg total) by mouth daily. 09/18/17   Ladell Pier, MD  losartan (COZAAR) 50 MG tablet TAKE 1 AND 1/2 TABLETS BY MOUTH DAILY. 03/30/18   Ladell Pier, MD  mirtazapine (REMERON) 15 MG tablet Take 1 tablet (15 mg total) by mouth at bedtime. 02/10/18 02/10/19  Aundra Dubin, MD  montelukast (SINGULAIR) 10 MG tablet TAKE 1 TABLET BY MOUTH AT BEDTIME. 02/03/18   Ladell Pier, MD  naproxen sodium (ALEVE) 220 MG tablet Take 440 mg by mouth 2 (two) times daily.    [provider]  nicotine (NICODERM CQ - DOSED IN MG/24 HOURS) 21 mg/24hr patch Place 1  patch (21 mg total) onto the skin daily. 09/18/17   Ladell Pier, MD  omeprazole (PRILOSEC) 40 MG capsule TAKE 1 CAPSULE BY MOUTH DAILY. 03/30/18   Ladell Pier, MD  ondansetron (ZOFRAN) 4 MG tablet Take 1 tablet (4 mg total) every 8 (eight) hours as needed by mouth for nausea or vomiting. 05/28/17   Elsie Stain, MD  predniSONE (STERAPRED UNI-PAK 21 TAB) 10 MG (21) TBPK tablet 6 tabs for 1 day, then 5 tabs for 1 das, then 4 tabs for 1 day, then 3 tabs for 1 day, 2 tabs for 1 day, then 1 tab for 1 day 04/30/18   Loura Halt A, NP  ticagrelor (BRILINTA) 90 MG TABS tablet Take 1 tablet (90 mg total) by mouth 2 (two) times daily. 10/28/17   Ladell Pier, MD  triamcinolone cream (KENALOG) 0.1 % APPLY 1 APPLICATION TOPICALLY 2 TIMES DAILY. 12/17/17   Ladell Pier, MD  TRUEPLUS LANCETS 28G MISC 28 g by Does not apply route QID. 04/04/17   Brayton Caves, PA-C  umeclidinium bromide (INCRUSE ELLIPTA) 62.5 MCG/INH AEPB Inhale 1 puff into the lungs daily. 09/18/17   Ladell Pier, MD    Family History Family History  Problem Relation Age of Onset  . Heart disease Mother   . Hypertension Mother   . Stroke Mother   . Mental illness Mother   . Heart disease Father   . Hypertension Father   . Diabetes Father   . Cancer Maternal Aunt        breast  . Thyroid disease Paternal Aunt   . Heart disease Maternal Grandfather   . Anesthesia problems Daughter   . Cancer Paternal Grandmother        mouth  . Hypotension Neg Hx   . Malignant hyperthermia Neg Hx   . Pseudochol deficiency Neg Hx  Social History Social History   Tobacco Use  . Smoking status: Current Every Day Smoker    Packs/day: 0.50    Years: 49.00    Pack years: 24.50    Types: Cigarettes  . Smokeless tobacco: Never Used  Substance Use Topics  . Alcohol use: Not Currently  . Drug use: No    Types: Cocaine    Comment: 03/25/2017 "used cocaine a couple days ago; smoke pot from time to time"      Allergies   Avocado; Latex; and Codeine   Review of Systems Review of Systems   Physical Exam Triage Vital Signs ED Triage Vitals [04/30/18 1155]  Enc Vitals Group     BP (!) 161/77     Pulse Rate 71     Resp 20     Temp 98.5 F (36.9 C)     Temp src      SpO2 100 %     Weight      Height      Head Circumference      Peak Flow      Pain Score 6     Pain Loc      Pain Edu?      Excl. in [place]?    No data found.  Updated Vital Signs BP (!) 161/77   Pulse 71   Temp 98.5 F (36.9 C)   Resp 20   SpO2 100%   Visual Acuity Right Eye Distance:   Left Eye Distance:   Bilateral Distance:    Right Eye Near:   Left Eye Near:    Bilateral Near:     Physical Exam  Constitutional: She is oriented to person, place, and time. She appears well-developed and well-nourished.  Very pleasant. Non toxic or ill appearing.   HENT:  Head: Normocephalic and atraumatic.  Bilateral TMs normal.  External ears normal.  Without posterior oropharyngeal erythema, tonsillar swelling or exudates. No lesions.  Clear drainage from nares.  No lymphadenopathy.   Eyes: Conjunctivae are normal.  Neck: Normal range of motion.  Cardiovascular: Normal rate, regular rhythm and normal heart sounds.  Pulmonary/Chest: No stridor. No respiratory distress. She has wheezes. She has no rales. She exhibits no tenderness.  Mild dyspnea.  Expiratory wheezing and rhonchi and right and left lower lobes.  Musculoskeletal: Normal range of motion.  Neurological: She is alert and oriented to person, place, and time.  Skin: Skin is warm and dry. No rash noted. No erythema. No pallor.  Psychiatric: She has a normal mood and affect.  Nursing note and vitals reviewed.    UC Treatments / Results  Labs (all labs ordered are listed, but only abnormal results are displayed) Labs Reviewed - No data to display  EKG None  Radiology Dg Chest 2 View  Result Date: 04/30/2018 CLINICAL DATA:  Productive  cough, shortness of breath. EXAM: CHEST - 2 VIEW COMPARISON:  Radiograph of September 15, 2017. FINDINGS: The heart size and mediastinal contours are within normal limits. Both lungs are clear. No pneumothorax or pleural effusion is noted. Hyperexpansion of the lungs is noted. The visualized skeletal structures are unremarkable. IMPRESSION: No active cardiopulmonary disease.  COPD. Electronically Signed   By: Marijo Conception, M.D.   On: 04/30/2018 12:25    Procedures Procedures (including critical care time)  Medications Ordered in UC Medications  ipratropium-albuterol (DUONEB) 0.5-2.5 (3) MG/3ML nebulizer solution 3 mL (3 mLs Nebulization Given 04/30/18 1303)    Initial Impression / Assessment and Plan /  UC Course  I have reviewed the triage vital signs and the nursing notes.  Pertinent labs & imaging results that were available during my care of the patient were reviewed by me and considered in my medical decision making (see chart for details).    X ray negative for pnuemonia COPD exacerbation Sinusitis Will treat with Augmentin, prednisone Duo neb given in the clinic.  Continue to use her inhalers and other medications at home as prescribed.  Follow up as needed for continued or worsening symptoms   Final Clinical Impressions(s) / UC Diagnoses   Final diagnoses:  COPD exacerbation ([place])  Chronic maxillary sinusitis     Discharge Instructions     Your x-ray was negative for pneumonia.  This is a COPD exacerbation Duo neb given in the clinic We will treat your sinus infection with Augmentin Benzonatate for cough Prednisone  taper for lung inflammation Continue your inhaler as needed.  Continue the allergy medication and Flonase.  Follow up as needed for continued or worsening symptoms     ED Prescriptions    Medication Sig Dispense Auth. Provider   predniSONE (STERAPRED UNI-PAK 21 TAB) 10 MG (21) TBPK tablet 6 tabs for 1 day, then 5 tabs for 1 das, then 4 tabs for 1  day, then 3 tabs for 1 day, 2 tabs for 1 day, then 1 tab for 1 day 21 tablet Jasreet Dickie A, NP   amoxicillin-clavulanate (AUGMENTIN) 875-125 MG tablet Take 1 tablet by mouth every 12 (twelve) hours. 14 tablet Teletha Petrea A, NP   benzonatate (TESSALON) 100 MG capsule Take 1 capsule (100 mg total) by mouth every 8 (eight) hours. 21 capsule Loura Halt A, NP     Controlled Substance Prescriptions [place] Controlled Substance Registry consulted? Not Applicable   Orvan July, NP 04/30/18 1340

## 2018-05-01 ENCOUNTER — Other Ambulatory Visit: Payer: Self-pay

## 2018-05-01 DIAGNOSIS — J441 Chronic obstructive pulmonary disease with (acute) exacerbation: Secondary | ICD-10-CM

## 2018-05-01 MED ORDER — FLUTICASONE FUROATE-VILANTEROL 200-25 MCG/INH IN AEPB: 1.0000 | INHALATION_SPRAY | Freq: Every day | RESPIRATORY_TRACT | 2 refills | Status: DC

## 2018-05-01 MED FILL — LEVOTHYROXINE 75 MCG TABLET: 75 | 30 days supply | Qty: 30 | Fill #0

## 2018-05-04 MED FILL — $BREO ELLIPTA 200-25 MCG IN: 200-25 | 30 days supply | Qty: 60 | Fill #0

## 2018-05-07 ENCOUNTER — Ambulatory Visit (INDEPENDENT_AMBULATORY_CARE_PROVIDER_SITE_OTHER): Payer: Self-pay | Admitting: Licensed Clinical Social Worker

## 2018-05-07 ENCOUNTER — Encounter (HOSPITAL_COMMUNITY): Payer: Self-pay | Admitting: Licensed Clinical Social Worker

## 2018-05-07 ENCOUNTER — Other Ambulatory Visit (HOSPITAL_COMMUNITY): Payer: Self-pay

## 2018-05-07 DIAGNOSIS — F319 Bipolar disorder, unspecified: Secondary | ICD-10-CM

## 2018-05-07 DIAGNOSIS — F32A Depression, unspecified: Secondary | ICD-10-CM

## 2018-05-07 DIAGNOSIS — F329 Major depressive disorder, single episode, unspecified: Secondary | ICD-10-CM

## 2018-05-07 DIAGNOSIS — F411 Generalized anxiety disorder: Secondary | ICD-10-CM

## 2018-05-07 MED ORDER — MIRTAZAPINE 15 MG PO TABS: 15.0000 mg | ORAL_TABLET | Freq: Every day | ORAL | 0 refills | Status: DC

## 2018-05-07 NOTE — Progress Notes (Signed)
THERAPIST PROGRESS NOTE  Session Time: 4:10-5pm  Participation Level: Active  Behavioral Response: CasualAlertAnxious  Type of Therapy: Individual Therapy  Treatment Goals addressed: Improve Psychiatric Symptoms, elevate mood (increased self-esteem, increased self-compassion, increased interaction), improve unhelpful thought patterns, controlled behavior, moderate mood, deliberate speech and thought process(improved social functioning, healthy adjustment to living situation), Learn about diagnosis, healthy coping skills.  Interventions: CBT  Summary: [name] [name] is a 61 y.o. female who presents for her individual counseling session. Pt presented anxious. Pt has been sick for the last month. She lost her job and is out of $. Her daughter will help her until she can start getting her SS in January. Asked open ended questions about pt's grief. Discussed the stages of grief with pt. She is moving forward in her feelings of grief. She went to Hospice with her daughter and met with a grief counselor and got lots of information. Pt shared she has not written in her journal. She reports she does not like to write and struggles to write from her thoughts. Discussed with pt she can talk about her thoughts and feelings,  Instead of writing them. Pt is still not interested in stopping smoking. Asked open ended questions about her continuing to smoke with COPD dx.  However, I gave her the 1-800-Quitline. To help with stopping smoking and to inquire about aids to assist when she decides to stop smoking.    Suicidal/Homicidal: Nowithout intent/plan  Therapist Response: Assessed pt's current functioning and reviewed progress. Assisted pt processing how to express her thoughts and feelings, grief, hospice,smoking with COPD dx. Assisted pt processing for the management of her stressors.  Plan: Return again in 2 weeks.   Diagnosis: Axis I: Bipolar 1 Disorder    , S, LCAS 05/07/2018 

## 2018-05-12 ENCOUNTER — Ambulatory Visit: Payer: Self-pay | Attending: Internal Medicine | Admitting: Physician Assistant

## 2018-05-12 VITALS — BP 125/68 | HR 69 | Temp 98.6°F | Resp 18 | Ht 61.5 in | Wt 118.0 lb

## 2018-05-12 DIAGNOSIS — F17200 Nicotine dependence, unspecified, uncomplicated: Secondary | ICD-10-CM | POA: Insufficient documentation

## 2018-05-12 DIAGNOSIS — R1010 Upper abdominal pain, unspecified: Secondary | ICD-10-CM | POA: Insufficient documentation

## 2018-05-12 DIAGNOSIS — Z7902 Long term (current) use of antithrombotics/antiplatelets: Secondary | ICD-10-CM | POA: Insufficient documentation

## 2018-05-12 DIAGNOSIS — Z79890 Hormone replacement therapy (postmenopausal): Secondary | ICD-10-CM | POA: Insufficient documentation

## 2018-05-12 DIAGNOSIS — K219 Gastro-esophageal reflux disease without esophagitis: Secondary | ICD-10-CM

## 2018-05-12 DIAGNOSIS — R7303 Prediabetes: Secondary | ICD-10-CM | POA: Insufficient documentation

## 2018-05-12 DIAGNOSIS — J441 Chronic obstructive pulmonary disease with (acute) exacerbation: Secondary | ICD-10-CM | POA: Insufficient documentation

## 2018-05-12 DIAGNOSIS — Z7982 Long term (current) use of aspirin: Secondary | ICD-10-CM | POA: Insufficient documentation

## 2018-05-12 DIAGNOSIS — Z7951 Long term (current) use of inhaled steroids: Secondary | ICD-10-CM | POA: Insufficient documentation

## 2018-05-12 DIAGNOSIS — Z79899 Other long term (current) drug therapy: Secondary | ICD-10-CM | POA: Insufficient documentation

## 2018-05-12 LAB — POCT GLYCOSYLATED HEMOGLOBIN (HGB A1C): HbA1c, POC (prediabetic range): 5.9 % (ref 5.7–6.4)

## 2018-05-12 MED ORDER — RANITIDINE HCL 150 MG PO TABS: 150.0000 mg | ORAL_TABLET | Freq: Two times a day (BID) | ORAL | 3 refills | Status: DC

## 2018-05-12 MED ORDER — PREDNISONE 10 MG (21) PO TBPK: ORAL_TABLET | ORAL | 0 refills | Status: DC

## 2018-05-12 MED ORDER — UMECLIDINIUM BROMIDE 62.5 MCG/INH IN AEPB: 1.0000 | INHALATION_SPRAY | Freq: Every day | RESPIRATORY_TRACT | 6 refills | Status: DC

## 2018-05-12 MED FILL — raNITIdine HCL 150 MG TABS: 150 | 30 days supply | Qty: 60 | Fill #0

## 2018-05-12 MED FILL — predniSONE 10 MG TABS: 10 | 6 days supply | Qty: 21 | Fill #0

## 2018-05-12 MED FILL — !INCRUSE ELLIPTA 62.5 MCG I: 62.5 | 30 days supply | Qty: 30 | Fill #0

## 2018-05-12 NOTE — Progress Notes (Signed)
Chief Complaint: ED follow up  Subjective: This is a 62 year old female with a history of COPD and continued smoking who was seen twice in the last 6 weeks with shortness of breath.  The first was on 03/27/2018.  She presented with shortness of breath and wheezing.  She was treated with duo nebs and Solu-Medrol.  She was sent home with a prescription for doxycycline which she did complete.  She returned on 04/30/2018 with increasing cough, congestion, runny nose and sinus pain.  She also had some shortness of breath and wheezing at that time.  A chest x-ray was completed which showed COPD but no acute process.  She was treated with duo nebs and given a prescription for prednisone and Augmentin.  She also was given some Best boy.  She states that she is out of her Incruse inhaler which seems to keeps her symptoms controlled for the last month.  She has been seen by the [facility] pulmonology in the past and does not wish to go back there.  She has completed her steroid taper and her antibiotics.  Some of her symptoms still persist.  She also wants to talk about some upper abdominal pain that she is been experiencing for the last several weeks.  Usually worse in the morning.  Sometimes a burning sensation.  Compliant with her omeprazole.  No nausea or vomiting.  Bowel movements normal.  ROS:  GEN: denies fever or chills, denies change in weight Skin: denies lesions or rashes HEENT: + headache, +earache, epistaxis, sore throat, or neck pain LUNGS: +SHOB, +dyspnea, PND, orthopnea CV: denies CP or palpitations EXT: denies muscle spasms or swelling; no pain in lower ext, no weakness  Objective:  Vitals:   05/12/18 1005  BP: 125/68  Pulse: 69  Resp: 18  Temp: 98.6 F (37 C)  TempSrc: Oral  SpO2: 97%  Weight: 118 lb (53.5 kg)  Height: 5' 1.5" (1.562 m)    Physical Exam:  General: in no acute distress. HEENT: no pallor, no icterus, moist oral mucosa, no JVD, no lymphadenopathy; wax  bilateral ears. Heart: Normal  s1 &s2  Regular rate and rhythm, without murmurs, rubs, gallops. Lungs: Clear to auscultation bilaterally. No wheezing today. Abdomen: Soft, nontender, nondistended, positive bowel sounds.   Medications: Prior to Admission medications   Medication Sig Start Date End Date Taking? Authorizing Provider  amoxicillin-clavulanate (AUGMENTIN) 875-125 MG tablet Take 1 tablet by mouth every 12 (twelve) hours. 04/30/18  Yes Bast, Traci A, NP  aspirin EC 81 MG tablet Take 81 mg by mouth daily.   Yes [provider]  atorvastatin (LIPITOR) 40 MG tablet Take 1 tablet (40 mg total) daily by mouth. Patient taking differently: Take 40 mg by mouth at bedtime.  05/28/17 05/12/18 Yes Elsie Stain, MD  benzonatate (TESSALON) 100 MG capsule Take 1 capsule (100 mg total) by mouth every 8 (eight) hours. 04/30/18  Yes Bast, Traci A, NP  Blood Glucose Monitoring Suppl (TRUE METRIX METER) DEVI 1 kit by Does not apply route 4 (four) times daily. 04/04/17  Yes Ena Dawley, Derik Fults S, PA-C  busPIRone (BUSPAR) 15 MG tablet Take 1 tablet (15 mg total) by mouth 3 (three) times daily. 02/10/18 02/10/19 Yes Eksir, Richard Miu, MD  Calcium 600 MG tablet Take 600 mg by mouth daily.  07/14/13  Yes Patrecia Pour, NP  cyclobenzaprine (FLEXERIL) 10 MG tablet TAKE 1 TABLET BY MOUTH 3 TIMES DAILY AS NEEDED. 04/03/18  Yes Ladell Pier, MD  fluticasone [facility]) 50  MCG/ACT nasal spray Place 2 sprays into both nostrils daily. 09/18/17  Yes Ladell Pier, MD  fluticasone furoate-vilanterol (BREO ELLIPTA) 200-25 MCG/INH AEPB Inhale 1 puff into the lungs daily. 05/01/18  Yes Ladell Pier, MD  furosemide (LASIX) 20 MG tablet TAKE 1 TABLET BY MOUTH DAILY. 04/30/18  Yes Charlott Rakes, MD  gabapentin (NEURONTIN) 600 MG tablet TAKE 1 TABLET BY MOUTH 4 TIMES DAILY 01/21/18  Yes Ladell Pier, MD  glucose blood (TRUE METRIX BLOOD GLUCOSE TEST) test strip Use as instructed 01/21/18  Yes  Ladell Pier, MD  hydrOXYzine (VISTARIL) 25 MG capsule TAKE 25MG BY MOUTH THREE TIMES DAILY 05/28/17  Yes Elsie Stain, MD  ipratropium-albuterol (DUONEB) 0.5-2.5 (3) MG/3ML SOLN Take 3 mLs every 6 (six) hours as needed by nebulization. Patient taking differently: Take 3 mLs by nebulization every 6 (six) hours as needed (wheezing).  05/28/17  Yes Elsie Stain, MD  labetalol (NORMODYNE) 100 MG tablet Take 1 tablet (100 mg total) by mouth 2 (two) times daily. 10/28/17  Yes Ladell Pier, MD  levothyroxine (SYNTHROID, LEVOTHROID) 75 MCG tablet Take 1 tablet (75 mcg total) by mouth daily. 04/30/18  Yes Ladell Pier, MD  loratadine (CLARITIN) 10 MG tablet Take 1 tablet (10 mg total) by mouth daily. 09/18/17  Yes Ladell Pier, MD  losartan (COZAAR) 50 MG tablet TAKE 1 AND 1/2 TABLETS BY MOUTH DAILY. 03/30/18  Yes Ladell Pier, MD  mirtazapine (REMERON) 15 MG tablet Take 1 tablet (15 mg total) by mouth at bedtime. 05/07/18 05/07/19 Yes Plovsky, Berneta Sages, MD  montelukast (SINGULAIR) 10 MG tablet TAKE 1 TABLET BY MOUTH AT BEDTIME. 02/03/18  Yes Ladell Pier, MD  naproxen sodium (ALEVE) 220 MG tablet Take 440 mg by mouth 2 (two) times daily.   Yes [provider]  omeprazole (PRILOSEC) 40 MG capsule TAKE 1 CAPSULE BY MOUTH DAILY. 03/30/18  Yes Ladell Pier, MD  ondansetron (ZOFRAN) 4 MG tablet Take 1 tablet (4 mg total) every 8 (eight) hours as needed by mouth for nausea or vomiting. 05/28/17  Yes Elsie Stain, MD  predniSONE (STERAPRED UNI-PAK 21 TAB) 10 MG (21) TBPK tablet 6 tabs for 1 day, then 5 tabs for 1 das, then 4 tabs for 1 day, then 3 tabs for 1 day, 2 tabs for 1 day, then 1 tab for 1 day 05/12/18  Yes Ena Dawley, Shanara Schnieders S, PA-C  ticagrelor (BRILINTA) 90 MG TABS tablet Take 1 tablet (90 mg total) by mouth 2 (two) times daily. 10/28/17  Yes Ladell Pier, MD  triamcinolone cream (KENALOG) 0.1 % APPLY 1 APPLICATION TOPICALLY 2 TIMES DAILY. 12/17/17   Yes Ladell Pier, MD  TRUEPLUS LANCETS 28G MISC 28 g by Does not apply route QID. 04/04/17  Yes Ena Dawley, Thais Silberstein S, PA-C  umeclidinium bromide (INCRUSE ELLIPTA) 62.5 MCG/INH AEPB Inhale 1 puff into the lungs daily. 05/12/18  Yes Eulene Pekar S, PA-C  VENTOLIN HFA 108 (90 Base) MCG/ACT inhaler INHALE 2 PUFFS INTO THE LUNGS EVERY 6 (SIX) HOURS AS NEEDED FOR WHEEZING OR SHORTNESS OF BREATH. 04/30/18  Yes Ladell Pier, MD  ranitidine (ZANTAC) 150 MG tablet Take 1 tablet (150 mg total) by mouth 2 (two) times daily. 05/12/18   Brayton Caves, PA-C    Assessment: 1. COPD exacerbation 2. Upper Abdominal Pain 3. Smoker  Plan: Refill Incruse Ellipta Cont Breo, nebs, Albuterol, Singulair and allergy regimen Prednisone taper Smoking cessation discussed, she is not ready to  QUIT Ask for appt with Dr. Joya Gaskins, she does not want to go back to [facility] Zantac 150 MG BID and Cont Omeprazole  Follow up:4 weeks with Dr. Wynetta Emery  The patient was given clear instructions to go to ER or return to medical center if symptoms don't improve, worsen or new problems develop. The patient verbalized understanding. The patient was told to call to get lab results if they haven't heard anything in the next week.   This note has been created with Surveyor, quantity. Any transcriptional errors are unintentional.   Zettie Pho, PA-C 05/12/2018, 10:22 AM

## 2018-05-15 ENCOUNTER — Ambulatory Visit: Payer: Self-pay

## 2018-05-18 ENCOUNTER — Ambulatory Visit: Payer: Self-pay | Admitting: Critical Care Medicine

## 2018-05-21 ENCOUNTER — Ambulatory Visit (HOSPITAL_COMMUNITY): Payer: Self-pay | Admitting: Licensed Clinical Social Worker

## 2018-05-28 ENCOUNTER — Ambulatory Visit: Payer: Self-pay

## 2018-06-04 ENCOUNTER — Ambulatory Visit (HOSPITAL_COMMUNITY): Payer: Self-pay | Admitting: Licensed Clinical Social Worker

## 2018-06-09 ENCOUNTER — Encounter (HOSPITAL_COMMUNITY): Payer: Self-pay | Admitting: Psychiatry

## 2018-06-09 ENCOUNTER — Ambulatory Visit: Payer: Self-pay | Admitting: Internal Medicine

## 2018-06-09 ENCOUNTER — Ambulatory Visit (INDEPENDENT_AMBULATORY_CARE_PROVIDER_SITE_OTHER): Payer: Medicaid Other | Admitting: Psychiatry

## 2018-06-09 VITALS — BP 134/73 | HR 74 | Ht 61.5 in | Wt 124.0 lb

## 2018-06-09 DIAGNOSIS — F32A Depression, unspecified: Secondary | ICD-10-CM

## 2018-06-09 DIAGNOSIS — F329 Major depressive disorder, single episode, unspecified: Secondary | ICD-10-CM

## 2018-06-09 DIAGNOSIS — F411 Generalized anxiety disorder: Secondary | ICD-10-CM

## 2018-06-09 MED ORDER — MIRTAZAPINE 30 MG PO TABS: 15.0000 mg | ORAL_TABLET | Freq: Every day | ORAL | 1 refills | Status: DC

## 2018-06-09 MED ORDER — BUSPIRONE HCL 15 MG PO TABS: 15.0000 mg | ORAL_TABLET | Freq: Three times a day (TID) | ORAL | 1 refills | Status: DC

## 2018-06-09 MED ORDER — DOXEPIN HCL 25 MG PO CAPS: 25.0000 mg | ORAL_CAPSULE | Freq: Every day | ORAL | Status: DC

## 2018-06-09 NOTE — Progress Notes (Signed)
[facility] MD/PA/NP OP Progress Note  06/09/2018 3:29 PM [name] [name]  MRN:  [id number]  Chief Complaint: med management, restlessness HPI:  This patient is a 62 year old divorced mother is being treated for generalized anxiety disorder.  Her biggest stress at this time is that she is good to move into a new apartment like setting.  Her daughter is helping this process.  She has 1 daughter and 2 grandchildren.  She is anxious about the move.  She is particularly concerned because she was told that he could not bring dogs unless she had a letter and said the dog is a therapy dog.  Today the patient does experience a lot of anxiety but is mainly situational.  It likely is related to the holidays and his mood.  He denies daily depression and she is eating well.  Her sleep is chronically disturbed.  Her energy level is good as is her ability to think and concentrate.  She is not suicidal now and never has been.  The patient had an extensive history of crack cocaine.  He used for 20-year.  On a regular basis up until 2008.  She is used since but only erratically.  Her last use was probably 2 years ago.  The patient denies ever having psychotic symptoms.  Back in 2008 not only did she is crack cocaine she used a great deal of alcohol.  Patient went to rehab and is been fairly clean.  Presently she is being seen in therapy in this setting.  She does describe chronic feelings of being anxious.  She describes problems with her sleep and feeling like she is always with ago.  She denies panic disorder or symptoms of obsessive-compulsive disorder.  Patient takes her medicines as prescribed.  It is noted the patient is never been in a psychiatric hospital.  Her daughter financially supports her.  The patient is sleep disturbed.  She has both interrupted sleep as well as problems initiating sleep.  She takes no naps she does admit to being sleepy and having a lack of energy.  She says the BuSpar is very helpful for  her anxiety.  Visit Diagnosis:    ICD-10-CM   1. GAD (generalized anxiety disorder) F41.1 busPIRone (BUSPAR) 15 MG tablet    mirtazapine (REMERON) 30 MG tablet  2. Depression, unspecified depression type F32.9 mirtazapine (REMERON) 30 MG tablet    Past Psychiatric History: history of substance use, crack cocaine addiction, alcohol use disorder  Past Medical History:  Past Medical History:  Diagnosis Date  . Anxiety    takes Lexapro daily  . Arthritis    "back, from neck down pass my bra area" (03/25/2017)  . Asthma   . Bartholin gland cyst 08/29/2011  . Bruises easily    pt is on Effient  . Chronic back pain    herniated nucleus pulposus  . Chronic back pain    "neck to bra area; lower back" (03/25/2017)  . COPD (chronic obstructive pulmonary disease) ([place])    early stages  . Coronary artery disease   . Depression    takes Klonopin daily  . Diabetes mellitus without complication ([place])   . Diverticulosis   . GERD (gastroesophageal reflux disease)    takes Nexium daily  . H/O hiatal hernia   . Heart attack ([place]) 2011  . Hemorrhoids   . Hernia   . Hyperlipidemia    takes Lipitor daily  . Hypertension    takes Losartan daily and Labetalol bid  .  Hypothyroidism    takes Synthroid daily  . Insomnia    hydroxyzine prn  . Joint pain   . Pneumonia    "couple times" (03/25/2017)  . Pre-diabetes    "just found out 1 wk ago" (03/25/2017)  . Psoriasis    elbows,knees,back  . Shortness of breath    with exertion  . Slowing of urinary stream   . Stress incontinence     Past Surgical History:  Procedure Laterality Date  . ABDOMINAL EXPLORATION SURGERY  1977  . ABDOMINAL HYSTERECTOMY  1977   "left one of my ovaries"  . BACK SURGERY    . COLONOSCOPY    . CORONARY ANGIOPLASTY WITH STENT PLACEMENT  2011 X2   "regular stents didn't work; had to go back in in ~ 1 month and put in medicated stents"  . DILATION AND CURETTAGE OF UTERUS    . ESOPHAGOGASTRODUODENOSCOPY    .  LEFT HEART CATH AND CORONARY ANGIOGRAPHY N/A 03/26/2017   Procedure: LEFT HEART CATH AND CORONARY ANGIOGRAPHY;  Surgeon: Belva Crome, MD;  Location: [facility];  Service: Cardiovascular;  Laterality: N/A;  . LUMBAR LAMINECTOMY/DECOMPRESSION MICRODISCECTOMY  08/05/2011   Procedure: LUMBAR LAMINECTOMY/DECOMPRESSION MICRODISCECTOMY;  Surgeon: Otilio Connors, MD;  Location: [facility];  Service: Neurosurgery;  Laterality: Right;  Right Lumbar four-five extraforaminal discectomy  . TONSILLECTOMY     as a child    Family Psychiatric History: See intake H&P for full details. Reviewed, with no updates at this time.   Family History:  Family History  Problem Relation Age of Onset  . Heart disease Mother   . Hypertension Mother   . Stroke Mother   . Mental illness Mother   . Heart disease Father   . Hypertension Father   . Diabetes Father   . Cancer Maternal Aunt        breast  . Thyroid disease Paternal Aunt   . Heart disease Maternal Grandfather   . Anesthesia problems Daughter   . Cancer Paternal Grandmother        mouth  . Hypotension Neg Hx   . Malignant hyperthermia Neg Hx   . Pseudochol deficiency Neg Hx     Social History:  Social History   Socioeconomic History  . Marital status: Divorced    Spouse name: Not on file  . Number of children: Not on file  . Years of education: Not on file  . Highest education level: Not on file  Occupational History  . Not on file  Social Needs  . Financial resource strain: Not hard at all  . Food insecurity:    Worry: Never true    Inability: Never true  . Transportation needs:    Medical: No    Non-medical: No  Tobacco Use  . Smoking status: Current Every Day Smoker    Packs/day: 0.50    Years: 49.00    Pack years: 24.50    Types: Cigarettes  . Smokeless tobacco: Never Used  Substance and Sexual Activity  . Alcohol use: Not Currently  . Drug use: No    Types: Cocaine    Comment: used recently  . Sexual activity:  Not Currently    Birth control/protection: Surgical  Lifestyle  . Physical activity:    Days per week: 7 days    Minutes per session: 10 min  . Stress: Only a little  Relationships  . Social connections:    Talks on phone: Not on file    Gets  together: Not on file    Attends religious service: Not on file    Active member of club or organization: Not on file    Attends meetings of clubs or organizations: Not on file    Relationship status: Not on file  Other Topics Concern  . Not on file  Social History Narrative  . Not on file    Allergies:  Allergies  Allergen Reactions  . Avocado Anaphylaxis  . Latex Shortness Of Breath and Rash  . Codeine Nausea Only    Metabolic Disorder Labs: Lab Results  Component Value Date   HGBA1C 5.9 05/12/2018   No results found for: PROLACTIN Lab Results  Component Value Date   CHOL 208 (H) 04/16/2016   TRIG 74 04/16/2016   HDL 55 04/16/2016   CHOLHDL 3.8 04/16/2016   VLDL 15 04/16/2016   LDLCALC 138 (H) 04/16/2016   LDLCALC 71 06/22/2015   Lab Results  Component Value Date   TSH 0.317 (L) 09/18/2017   TSH 0.386 (L) 10/31/2016    Therapeutic Level Labs: No results found for: LITHIUM No results found for: VALPROATE No components found for:  CBMZ  Current Medications: Current Outpatient Medications  Medication Sig Dispense Refill  . aspirin EC 81 MG tablet Take 81 mg by mouth daily.    . Blood Glucose Monitoring Suppl (TRUE METRIX METER) DEVI 1 kit by Does not apply route 4 (four) times daily. 1 Device 0  . busPIRone (BUSPAR) 15 MG tablet Take 1 tablet (15 mg total) by mouth 3 (three) times daily. 270 tablet 1  . Calcium 600 MG tablet Take 600 mg by mouth daily.  60 tablet   . cyclobenzaprine (FLEXERIL) 10 MG tablet TAKE 1 TABLET BY MOUTH 3 TIMES DAILY AS NEEDED. 30 tablet 0  . fluticasone (FLONASE) 50 MCG/ACT nasal spray Place 2 sprays into both nostrils daily. 16 g 6  . fluticasone furoate-vilanterol (BREO ELLIPTA) 200-25  MCG/INH AEPB Inhale 1 puff into the lungs daily. 60 each 2  . furosemide (LASIX) 20 MG tablet TAKE 1 TABLET BY MOUTH DAILY. 30 tablet 0  . gabapentin (NEURONTIN) 600 MG tablet TAKE 1 TABLET BY MOUTH 4 TIMES DAILY 120 tablet 3  . glucose blood (TRUE METRIX BLOOD GLUCOSE TEST) test strip Use as instructed 100 each 12  . ipratropium-albuterol (DUONEB) 0.5-2.5 (3) MG/3ML SOLN Take 3 mLs every 6 (six) hours as needed by nebulization. (Patient taking differently: Take 3 mLs by nebulization every 6 (six) hours as needed (wheezing). ) 360 mL 3  . labetalol (NORMODYNE) 100 MG tablet Take 1 tablet (100 mg total) by mouth 2 (two) times daily. 60 tablet 6  . levothyroxine (SYNTHROID, LEVOTHROID) 75 MCG tablet Take 1 tablet (75 mcg total) by mouth daily. 30 tablet 1  . losartan (COZAAR) 50 MG tablet TAKE 1 AND 1/2 TABLETS BY MOUTH DAILY. 45 tablet 2  . mirtazapine (REMERON) 30 MG tablet Take 0.5 tablets (15 mg total) by mouth at bedtime. 90 tablet 1  . montelukast (SINGULAIR) 10 MG tablet TAKE 1 TABLET BY MOUTH AT BEDTIME. 30 tablet 2  . naproxen sodium (ALEVE) 220 MG tablet Take 440 mg by mouth 2 (two) times daily.    Marland Kitchen omeprazole (PRILOSEC) 40 MG capsule TAKE 1 CAPSULE BY MOUTH DAILY. 30 capsule 2  . ondansetron (ZOFRAN) 4 MG tablet Take 1 tablet (4 mg total) every 8 (eight) hours as needed by mouth for nausea or vomiting. 20 tablet 0  . ranitidine (ZANTAC) 150 MG tablet  Take 1 tablet (150 mg total) by mouth 2 (two) times daily. 60 tablet 3  . ticagrelor (BRILINTA) 90 MG TABS tablet Take 1 tablet (90 mg total) by mouth 2 (two) times daily. 60 tablet 6  . triamcinolone cream (KENALOG) 0.1 % APPLY 1 APPLICATION TOPICALLY 2 TIMES DAILY. 30 g 0  . TRUEPLUS LANCETS 28G MISC 28 g by Does not apply route QID. 120 each 2  . umeclidinium bromide (INCRUSE ELLIPTA) 62.5 MCG/INH AEPB Inhale 1 puff into the lungs daily. 1 each 6  . VENTOLIN HFA 108 (90 Base) MCG/ACT inhaler INHALE 2 PUFFS INTO THE LUNGS EVERY 6 (SIX)  HOURS AS NEEDED FOR WHEEZING OR SHORTNESS OF BREATH. 18 g 0  . amoxicillin-clavulanate (AUGMENTIN) 875-125 MG tablet Take 1 tablet by mouth every 12 (twelve) hours. 14 tablet 0  . atorvastatin (LIPITOR) 40 MG tablet Take 1 tablet (40 mg total) daily by mouth. (Patient taking differently: Take 40 mg by mouth at bedtime. ) 90 tablet 3  . benzonatate (TESSALON) 100 MG capsule Take 1 capsule (100 mg total) by mouth every 8 (eight) hours. (Patient not taking: Reported on 06/09/2018) 21 capsule 0  . hydrOXYzine (VISTARIL) 25 MG capsule TAKE 25MG BY MOUTH THREE TIMES DAILY (Patient not taking: Reported on 06/09/2018) 90 capsule 3  . loratadine (CLARITIN) 10 MG tablet Take 1 tablet (10 mg total) by mouth daily. 30 tablet 11  . predniSONE (STERAPRED UNI-PAK 21 TAB) 10 MG (21) TBPK tablet 6 tabs for 1 day, then 5 tabs for 1 das, then 4 tabs for 1 day, then 3 tabs for 1 day, 2 tabs for 1 day, then 1 tab for 1 day (Patient not taking: Reported on 06/09/2018) 21 tablet 0   No current facility-administered medications for this visit.     Musculoskeletal: Strength & Muscle Tone: within normal limits Gait & Station: normal Patient leans: N/A  Psychiatric Specialty Exam: ROS  Blood pressure 134/73, pulse 74, height 5' 1.5" (1.562 m), weight 124 lb (56.2 kg), SpO2 99 %.Body mass index is 23.05 kg/m.  General Appearance: Casual and Fairly Groomed  Eye Contact:  Fair  Speech:  Clear and Coherent and Normal Rate  Volume:  Normal  Mood:  Anxious and Depressed  Affect:  Appropriate and Congruent  Thought Process:  Goal Directed and Descriptions of Associations: Intact  Orientation:  Full (Time, Place, and Person)  Thought Content: Logical   Suicidal Thoughts:  No  Homicidal Thoughts:  No  Memory:  Immediate;   Good  Judgement:  Fair  Insight:  Fair  Psychomotor Activity:  Normal  Concentration:  Concentration: Fair  Recall:  [place] of Knowledge: Fair  Language: Fair  Akathisia:  Negative   Handed:  Right  AIMS (if indicated): not done  Assets:  Communication Skills Desire for Improvement Housing  ADL's:  Intact  Cognition: WNL  Sleep:  Poor   Screenings: AUDIT     Admission (Discharged) from 07/10/2013 in [facility]  Alcohol Use Disorder Identification Test Final Score (AUDIT)  12    GAD-7     Office Visit from 12/25/2017 in [facility] Counselor from 12/22/2017 in [facility] Office Visit from 09/18/2017 in [facility] Visit from 04/04/2017 in [facility] Visit from 03/13/2017 in [facility]  Total GAD-7 Score  '18  21  12  ' 7  15    PHQ2-9     Office Visit from 12/25/2017 in [facility] Counselor from 12/22/2017 in [facility] Office Visit from 09/18/2017 in [facility] Office Visit from 05/28/2017 in [facility] Office Visit from 04/04/2017 in [facility]  PHQ-2 Total Score  '6  6  2  2  2  ' PHQ-9 Total Score  '24  23  8  9  7       ' Assessment and Plan:  This patient has been diagnosed with generalized anxiety disorder.  It is not clear if this is accurate or not.  Otherwise she will continue taking Lexapro 20 mg.  In close evaluation the patient denies a clear episode of major depression in her adult life when she was not using crack cocaine or drinking alcohol.  Therefore she does not have any drug-free episodes of major depression.  Lexapro insert would be appropriate if she has generalized anxiety disorder but that is difficult to interpret at this time.  The patient has been taking 15 mg of Remeron but it is noted that she is not sleeping well and still feels hyper and anxious.  At this time will go ahead and asked her to  increase Remeron to 30 mg dose.  She will continue taking BuSpar 15 mg 3 times daily at this time we will add doxepin 10 mg for sleep.  She will continue in therapy in this setting.  Patient to return to see me after the holidays in approximately 3 months.  Patient is certainly not suicidal.  She is not psychotic.  She denies chest pain or shortness of breath or any neurological symptoms she does describe moderate fatigue and I think her symptomatology would be considered to be moderate and not completely adequately treated.  1. GAD (generalized anxiety disorder)   2. Depression, unspecified depression type     Status of current problems: new to [employer] Ordered: No orders of the defined types were placed in this encounter.   Labs Reviewed: na  Collateral Obtained/Records Reviewed: Reviewed notes from Dr. Adele Schilder  Plan:  Increase Lexapro to 20 mg Increase BuSpar to 15 mg 3 times daily Seroquel discontinued by patient given  restless legs and leg cramps Initiate Remeron 15 mg nightly Continue individual therapy and palliative/hospice grief counselor   Jerral Ralph, MD 06/09/2018, 3:29 PM

## 2018-06-15 ENCOUNTER — Other Ambulatory Visit: Payer: Self-pay | Admitting: Family Medicine

## 2018-06-15 ENCOUNTER — Other Ambulatory Visit: Payer: Self-pay | Admitting: Critical Care Medicine

## 2018-06-15 ENCOUNTER — Other Ambulatory Visit: Payer: Self-pay | Admitting: Internal Medicine

## 2018-06-15 DIAGNOSIS — I10 Essential (primary) hypertension: Secondary | ICD-10-CM

## 2018-06-15 MED FILL — LOSARTAN POTASSIUM 50 MG TA: 50 | 30 days supply | Qty: 45 | Fill #2

## 2018-06-15 MED FILL — $INCRUSE ELLIPTA 62.5 MCG I: 62.5 MCG | 90 days supply | Qty: 90 | Fill #1

## 2018-06-15 MED FILL — $BREO ELLIPTA 200-25 MCG IN: 200-25 | 30 days supply | Qty: 60 | Fill #1

## 2018-06-15 MED FILL — LEVOTHYROXINE 75 MCG TABLET: 75 | 30 days supply | Qty: 30 | Fill #1

## 2018-06-15 MED FILL — BRILINTA 90 MG TABLET: 90 | 30 days supply | Qty: 60 | Fill #3

## 2018-06-15 MED FILL — LABETALOL HCL 100 MG TABS: 100 | 30 days supply | Qty: 60 | Fill #5

## 2018-06-16 MED FILL — FUROSEMIDE 20 MG TABLET: 20 | 30 days supply | Qty: 30 | Fill #0

## 2018-06-16 MED FILL — ALBUTEROL SULFATE HFA 108 (: 108 (90 BAS | 25 days supply | Qty: 18 | Fill #0

## 2018-06-16 MED FILL — CYCLOBENZAPRINE 10 MG TAB: 10 | 10 days supply | Qty: 30 | Fill #0

## 2018-06-16 MED FILL — MONTELUKAST SOD 10 MG TAB: 10 | 30 days supply | Qty: 30 | Fill #0

## 2018-06-18 ENCOUNTER — Ambulatory Visit (INDEPENDENT_AMBULATORY_CARE_PROVIDER_SITE_OTHER): Payer: Medicaid Other | Admitting: Licensed Clinical Social Worker

## 2018-06-18 ENCOUNTER — Encounter (HOSPITAL_COMMUNITY): Payer: Self-pay | Admitting: Licensed Clinical Social Worker

## 2018-06-18 DIAGNOSIS — F32A Depression, unspecified: Secondary | ICD-10-CM

## 2018-06-18 DIAGNOSIS — F329 Major depressive disorder, single episode, unspecified: Secondary | ICD-10-CM

## 2018-06-18 DIAGNOSIS — F411 Generalized anxiety disorder: Secondary | ICD-10-CM

## 2018-06-18 NOTE — Progress Notes (Signed)
   THERAPIST PROGRESS NOTE  Session Time: 3:40-4:30pm  Participation Level: Active  Behavioral Response: CasualAlertAnxious  Type of Therapy: Individual Therapy  Treatment Goals addressed: Improve Psychiatric Symptoms, elevate mood (increased self-esteem, increased self-compassion, increased interaction), improve unhelpful thought patterns, controlled behavior, moderate mood, deliberate speech and thought process(improved social functioning, healthy adjustment to living situation), Learn about diagnosis, healthy coping skills.  Interventions: CBT  Summary: [name] [name] is a 62 y.o. female who presents for her individual counseling session. Pt discussed her psychiatric symptoms and current life events. Pt presented anxious. She missed her last 2 appointments. Discussed policy of missing appointments. Pt is 100% [employer] discount. Pt saw Dr. Casimiro Needle and her increased her Remeron to 30 mg. Pt is moving to [place] [employer]. And needs a letter for companion letter for her dog. Pt is not working except 1 day per week. Her daughter is paying all her bills until she is eligible for social security in February. Pt's birthday was yesterday and she went out to dinner with her daughter, son-in-law and grandkids. Pt was tearful in discussing her birthday with her family. She never knew life could be so good. Asked open ended questions. Pt reports her COPD is getting worse, as she still smokes. Discussed smoking cessation with pt.She is not interested in smoking cessation. Pt is [facility] for loss of a loved one (her mother) in 2019. Discussed with pt the continued suggestion that she attend grief counseling at Hospice.        Suicidal/Homicidal: Nowithout intent/plan  Therapist Response: Assessed pt's current functioning and reviewed progress. Assisted pt processing new living situation, letter needed for companion dog, birthday,  grief, hospice,smoking with COPD dx. Assisted  pt processing for the management of her stressors.  Plan: Return again in 2 weeks.   Diagnosis: Axis I: Bipolar 1 Disorder    , S, LCAS 06/18/2018

## 2018-06-19 ENCOUNTER — Ambulatory Visit: Payer: Self-pay | Attending: Internal Medicine | Admitting: Internal Medicine

## 2018-06-19 ENCOUNTER — Encounter: Payer: Self-pay | Admitting: Internal Medicine

## 2018-06-19 VITALS — BP 143/86 | HR 72 | Temp 98.9°F | Resp 16 | Wt 126.6 lb

## 2018-06-19 DIAGNOSIS — M25542 Pain in joints of left hand: Secondary | ICD-10-CM | POA: Insufficient documentation

## 2018-06-19 DIAGNOSIS — K219 Gastro-esophageal reflux disease without esophagitis: Secondary | ICD-10-CM | POA: Insufficient documentation

## 2018-06-19 DIAGNOSIS — R7303 Prediabetes: Secondary | ICD-10-CM | POA: Insufficient documentation

## 2018-06-19 DIAGNOSIS — Z79899 Other long term (current) drug therapy: Secondary | ICD-10-CM | POA: Insufficient documentation

## 2018-06-19 DIAGNOSIS — Z7982 Long term (current) use of aspirin: Secondary | ICD-10-CM | POA: Insufficient documentation

## 2018-06-19 DIAGNOSIS — G4700 Insomnia, unspecified: Secondary | ICD-10-CM | POA: Insufficient documentation

## 2018-06-19 DIAGNOSIS — L409 Psoriasis, unspecified: Secondary | ICD-10-CM | POA: Insufficient documentation

## 2018-06-19 DIAGNOSIS — Z833 Family history of diabetes mellitus: Secondary | ICD-10-CM | POA: Insufficient documentation

## 2018-06-19 DIAGNOSIS — Z955 Presence of coronary angioplasty implant and graft: Secondary | ICD-10-CM | POA: Insufficient documentation

## 2018-06-19 DIAGNOSIS — F419 Anxiety disorder, unspecified: Secondary | ICD-10-CM | POA: Insufficient documentation

## 2018-06-19 DIAGNOSIS — M25541 Pain in joints of right hand: Secondary | ICD-10-CM | POA: Insufficient documentation

## 2018-06-19 DIAGNOSIS — R0981 Nasal congestion: Secondary | ICD-10-CM | POA: Insufficient documentation

## 2018-06-19 DIAGNOSIS — J441 Chronic obstructive pulmonary disease with (acute) exacerbation: Secondary | ICD-10-CM | POA: Insufficient documentation

## 2018-06-19 DIAGNOSIS — F17210 Nicotine dependence, cigarettes, uncomplicated: Secondary | ICD-10-CM | POA: Insufficient documentation

## 2018-06-19 DIAGNOSIS — L853 Xerosis cutis: Secondary | ICD-10-CM

## 2018-06-19 DIAGNOSIS — Z7951 Long term (current) use of inhaled steroids: Secondary | ICD-10-CM | POA: Insufficient documentation

## 2018-06-19 DIAGNOSIS — Z91040 Latex allergy status: Secondary | ICD-10-CM | POA: Insufficient documentation

## 2018-06-19 DIAGNOSIS — Z79890 Hormone replacement therapy (postmenopausal): Secondary | ICD-10-CM | POA: Insufficient documentation

## 2018-06-19 DIAGNOSIS — E785 Hyperlipidemia, unspecified: Secondary | ICD-10-CM | POA: Insufficient documentation

## 2018-06-19 DIAGNOSIS — I2510 Atherosclerotic heart disease of native coronary artery without angina pectoris: Secondary | ICD-10-CM | POA: Insufficient documentation

## 2018-06-19 DIAGNOSIS — Z91018 Allergy to other foods: Secondary | ICD-10-CM | POA: Insufficient documentation

## 2018-06-19 DIAGNOSIS — Z823 Family history of stroke: Secondary | ICD-10-CM | POA: Insufficient documentation

## 2018-06-19 DIAGNOSIS — Z885 Allergy status to narcotic agent status: Secondary | ICD-10-CM | POA: Insufficient documentation

## 2018-06-19 DIAGNOSIS — Z8249 Family history of ischemic heart disease and other diseases of the circulatory system: Secondary | ICD-10-CM | POA: Insufficient documentation

## 2018-06-19 DIAGNOSIS — I10 Essential (primary) hypertension: Secondary | ICD-10-CM | POA: Insufficient documentation

## 2018-06-19 DIAGNOSIS — F329 Major depressive disorder, single episode, unspecified: Secondary | ICD-10-CM | POA: Insufficient documentation

## 2018-06-19 DIAGNOSIS — E039 Hypothyroidism, unspecified: Secondary | ICD-10-CM | POA: Insufficient documentation

## 2018-06-19 MED ORDER — AMOXICILLIN-POT CLAVULANATE 875-125 MG PO TABS: 1.0000 | ORAL_TABLET | Freq: Two times a day (BID) | ORAL | 0 refills | Status: DC

## 2018-06-19 MED ORDER — SALINE SPRAY 0.65 % NA SOLN: 1.0000 | NASAL | 1 refills | Status: DC | PRN

## 2018-06-19 MED ORDER — PREDNISONE 10 MG PO TABS: 30.0000 mg | ORAL_TABLET | Freq: Every day | ORAL | 0 refills | Status: DC

## 2018-06-19 MED ORDER — TRIAMCINOLONE ACETONIDE 0.1 % EX CREA: TOPICAL_CREAM | CUTANEOUS | 1 refills | Status: DC

## 2018-06-19 MED ORDER — ATORVASTATIN CALCIUM 40 MG PO TABS: 40.0000 mg | ORAL_TABLET | Freq: Every day | ORAL | 2 refills | Status: DC

## 2018-06-19 MED ORDER — ESCITALOPRAM OXALATE 20 MG PO TABS: 20.0000 mg | ORAL_TABLET | Freq: Every day | ORAL | 3 refills | Status: DC

## 2018-06-19 MED ORDER — BENZONATATE 100 MG PO CAPS: 100.0000 mg | ORAL_CAPSULE | Freq: Three times a day (TID) | ORAL | 0 refills | Status: DC

## 2018-06-19 NOTE — Progress Notes (Signed)
Patient ID: CARLEIGH BUCCIERI, female    DOB: [birth year]  MRN: [id number]  CC: Medication Refill; skin lesion; and COPD   Subjective: Rennie Hack is a 62 y.o. female who presents for UC visit Her concerns today include:  Patient with history ofpre-DM, GERD, hypothyroidism, HTN, HL, CAD with previous stent, COPD, substance abuse and tobacco dependence  C/o of feeling sick for past 2-3 wks Symptoms: Shortness of breath and wheezing.  +Nasal congestion, sore throat, cough productive of grey phlegm, discolored mucous from nostril.   Has sores in nose, puts Neosporin in the nostrils on the sores  Thinks she has psoriatic arthritis because of pains in the joints of the hands and severely dry skin on the palmar surface of the fingers.  Reports that her daughter and father both have psoriasis arthritis and are on Humira with good results.  She has been using triamcinolone cream and other over-the-counter lotion on her hands to try to decrease the dryness and cracking.  She has seen dermatologist fired in the past and states she was told to use baby oil which has not helped.  She has to use latex gloves at work which she feels also contributes to the dryness.. -Reports that the joints in her hands sometimes are swollen and red.  Endorses stiffness in the hands.  Requesting refill on Lipitor.  History of anxiety/depression: Requests refill on Lexapro.  She is currently followed by a psychiatrist and a counselor. Patient Active Problem List   Diagnosis Date Noted  . Insomnia 10/28/2017  . COPD with acute exacerbation ([place]) 09/15/2017  . Pre-diabetes 05/28/2017  . Emesis, persistent 05/28/2017  . QT prolongation 03/25/2017  . Encounter for screening mammogram for breast cancer 09/11/2015  . Psoriasis 06/22/2015  . COPD (chronic obstructive pulmonary disease) ([place]) 05/18/2015  . Anxiety and depression 07/10/2013  . Essential hypertension, benign 06/07/2013  . Dyslipidemia 06/07/2013  . Asthma,  chronic 06/07/2013  . Emphysema lung ([place]) 06/07/2013  . Chronic back pain 06/07/2013  . CAD S/P percutaneous coronary angioplasty 06/07/2013  . GERD (gastroesophageal reflux disease) 06/07/2013  . Breast lump on left side at 3 o'clock position 10/13/2012  . S/P abdominal hysterectomy and right salpingo-oophorectomy 08/29/2011  . COPD exacerbation ([place]) 09/15/2009  . TOBACCO ABUSE 07/17/2009  . Chronic rhinitis 07/17/2009  . Lung nodule < 6cm on CT 07/17/2009  . ALLERGY, FOOD 07/17/2009  . Hypothyroidism 12/22/2008     Current Outpatient Medications on File Prior to Visit  Medication Sig Dispense Refill  . escitalopram (LEXAPRO) 20 MG tablet Take 20 mg by mouth daily.    Marland Kitchen albuterol (PROVENTIL HFA;VENTOLIN HFA) 108 (90 Base) MCG/ACT inhaler INHALE 2 PUFFS INTO THE LUNGS EVERY 6 (SIX) HOURS AS NEEDED FOR WHEEZING OR SHORTNESS OF BREATH. 18 g 0  . amoxicillin-clavulanate (AUGMENTIN) 875-125 MG tablet Take 1 tablet by mouth every 12 (twelve) hours. (Patient not taking: Reported on 06/19/2018) 14 tablet 0  . aspirin EC 81 MG tablet Take 81 mg by mouth daily.    Marland Kitchen atorvastatin (LIPITOR) 40 MG tablet Take 1 tablet (40 mg total) daily by mouth. (Patient taking differently: Take 40 mg by mouth at bedtime. ) 90 tablet 3  . benzonatate (TESSALON) 100 MG capsule Take 1 capsule (100 mg total) by mouth every 8 (eight) hours. (Patient not taking: Reported on 06/09/2018) 21 capsule 0  . Blood Glucose Monitoring Suppl (TRUE METRIX METER) DEVI 1 kit by Does not apply route 4 (four) times daily. 1 Device 0  .  busPIRone (BUSPAR) 15 MG tablet Take 1 tablet (15 mg total) by mouth 3 (three) times daily. 270 tablet 1  . Calcium 600 MG tablet Take 600 mg by mouth daily.  60 tablet   . cyclobenzaprine (FLEXERIL) 10 MG tablet TAKE 1 TABLET BY MOUTH 3 TIMES DAILY AS NEEDED. 30 tablet 0  . fluticasone (FLONASE) 50 MCG/ACT nasal spray Place 2 sprays into both nostrils daily. 16 g 6  . fluticasone furoate-vilanterol  (BREO ELLIPTA) 200-25 MCG/INH AEPB Inhale 1 puff into the lungs daily. 60 each 2  . furosemide (LASIX) 20 MG tablet TAKE 1 TABLET BY MOUTH DAILY. 30 tablet 2  . gabapentin (NEURONTIN) 600 MG tablet TAKE 1 TABLET BY MOUTH 4 TIMES DAILY 120 tablet 3  . glucose blood (TRUE METRIX BLOOD GLUCOSE TEST) test strip Use as instructed 100 each 12  . hydrOXYzine (VISTARIL) 25 MG capsule TAKE 25MG BY MOUTH THREE TIMES DAILY (Patient not taking: Reported on 06/09/2018) 90 capsule 3  . ipratropium-albuterol (DUONEB) 0.5-2.5 (3) MG/3ML SOLN Take 3 mLs every 6 (six) hours as needed by nebulization. (Patient taking differently: Take 3 mLs by nebulization every 6 (six) hours as needed (wheezing). ) 360 mL 3  . labetalol (NORMODYNE) 100 MG tablet Take 1 tablet (100 mg total) by mouth 2 (two) times daily. 60 tablet 6  . levothyroxine (SYNTHROID, LEVOTHROID) 75 MCG tablet Take 1 tablet (75 mcg total) by mouth daily. 30 tablet 1  . loratadine (CLARITIN) 10 MG tablet Take 1 tablet (10 mg total) by mouth daily. 30 tablet 11  . losartan (COZAAR) 50 MG tablet TAKE 1 AND 1/2 TABLETS BY MOUTH DAILY. 45 tablet 2  . mirtazapine (REMERON) 30 MG tablet Take 0.5 tablets (15 mg total) by mouth at bedtime. 90 tablet 1  . montelukast (SINGULAIR) 10 MG tablet TAKE 1 TABLET BY MOUTH AT BEDTIME. 30 tablet 2  . naproxen sodium (ALEVE) 220 MG tablet Take 440 mg by mouth 2 (two) times daily.    Marland Kitchen omeprazole (PRILOSEC) 40 MG capsule TAKE 1 CAPSULE BY MOUTH DAILY. 30 capsule 2  . ondansetron (ZOFRAN) 4 MG tablet Take 1 tablet (4 mg total) every 8 (eight) hours as needed by mouth for nausea or vomiting. 20 tablet 0  . predniSONE (STERAPRED UNI-PAK 21 TAB) 10 MG (21) TBPK tablet 6 tabs for 1 day, then 5 tabs for 1 das, then 4 tabs for 1 day, then 3 tabs for 1 day, 2 tabs for 1 day, then 1 tab for 1 day (Patient not taking: Reported on 06/09/2018) 21 tablet 0  . ranitidine (ZANTAC) 150 MG tablet Take 1 tablet (150 mg total) by mouth 2 (two) times  daily. 60 tablet 3  . ticagrelor (BRILINTA) 90 MG TABS tablet Take 1 tablet (90 mg total) by mouth 2 (two) times daily. 60 tablet 6  . triamcinolone cream (KENALOG) 0.1 % APPLY 1 APPLICATION TOPICALLY 2 TIMES DAILY. 30 g 0  . TRUEPLUS LANCETS 28G MISC 28 g by Does not apply route QID. 120 each 2  . umeclidinium bromide (INCRUSE ELLIPTA) 62.5 MCG/INH AEPB Inhale 1 puff into the lungs daily. 1 each 6   Current Facility-Administered Medications on File Prior to Visit  Medication Dose Route Frequency Provider Last Rate Last Dose  . doxepin (SINEQUAN) capsule 25 mg  25 mg Oral QHS Norma Fredrickson, MD        Allergies  Allergen Reactions  . Avocado Anaphylaxis  . Latex Shortness Of Breath and Rash  . Codeine  Nausea Only    Social History   Socioeconomic History  . Marital status: Divorced    Spouse name: Not on file  . Number of children: Not on file  . Years of education: Not on file  . Highest education level: Not on file  Occupational History  . Not on file  Social Needs  . Financial resource strain: Not hard at all  . Food insecurity:    Worry: Never true    Inability: Never true  . Transportation needs:    Medical: No    Non-medical: No  Tobacco Use  . Smoking status: Current Every Day Smoker    Packs/day: 0.50    Years: 49.00    Pack years: 24.50    Types: Cigarettes  . Smokeless tobacco: Never Used  Substance and Sexual Activity  . Alcohol use: Not Currently  . Drug use: No    Types: Cocaine    Comment: used recently  . Sexual activity: Not Currently    Birth control/protection: Surgical  Lifestyle  . Physical activity:    Days per week: 7 days    Minutes per session: 10 min  . Stress: Only a little  Relationships  . Social connections:    Talks on phone: Not on file    Gets together: Not on file    Attends religious service: Not on file    Active member of club or organization: Not on file    Attends meetings of clubs or organizations: Not on file     Relationship status: Not on file  . Intimate partner violence:    Fear of current or ex partner: Not on file    Emotionally abused: Not on file    Physically abused: Not on file    Forced sexual activity: Not on file  Other Topics Concern  . Not on file  Social History Narrative  . Not on file    Family History  Problem Relation Age of Onset  . Heart disease Mother   . Hypertension Mother   . Stroke Mother   . Mental illness Mother   . Heart disease Father   . Hypertension Father   . Diabetes Father   . Cancer Maternal Aunt        breast  . Thyroid disease Paternal Aunt   . Heart disease Maternal Grandfather   . Anesthesia problems Daughter   . Cancer Paternal Grandmother        mouth  . Hypotension Neg Hx   . Malignant hyperthermia Neg Hx   . Pseudochol deficiency Neg Hx     Past Surgical History:  Procedure Laterality Date  . ABDOMINAL EXPLORATION SURGERY  1977  . ABDOMINAL HYSTERECTOMY  1977   "left one of my ovaries"  . BACK SURGERY    . COLONOSCOPY    . CORONARY ANGIOPLASTY WITH STENT PLACEMENT  2011 X2   "regular stents didn't work; had to go back in in ~ 1 month and put in medicated stents"  . DILATION AND CURETTAGE OF UTERUS    . ESOPHAGOGASTRODUODENOSCOPY    . LEFT HEART CATH AND CORONARY ANGIOGRAPHY N/A 03/26/2017   Procedure: LEFT HEART CATH AND CORONARY ANGIOGRAPHY;  Surgeon: Belva Crome, MD;  Location: [facility];  Service: Cardiovascular;  Laterality: N/A;  . LUMBAR LAMINECTOMY/DECOMPRESSION MICRODISCECTOMY  08/05/2011   Procedure: LUMBAR LAMINECTOMY/DECOMPRESSION MICRODISCECTOMY;  Surgeon: Otilio Connors, MD;  Location: [facility];  Service: Neurosurgery;  Laterality: Right;  Right Lumbar four-five extraforaminal  discectomy  . TONSILLECTOMY     as a child    ROS: Review of Systems Negative except as above.    PHYSICAL EXAM: BP (!) 143/86   Pulse 72   Temp 98.9 F (37.2 C) (Oral)   Resp 16   Wt 126 lb 9.6 oz (57.4 kg)   SpO2 98%    BMI 23.53 kg/m   Physical Exam  General appearance - alert, well appearing, and in no distress Mental status - normal mood, behavior, speech, dress, motor activity, and thought processes Nose -nasal mucosa is dry.  She has a small sore noted in the left nares  mouth - mucous membranes moist, pharynx normal without lesions Neck - supple, no significant adenopathy Chest -mild diffuse wheezing noted bilaterally Heart - normal rate, regular rhythm, normal S1, S2, no murmurs, rubs, clicks or gallops Musculoskeletal -mild enlargement of the PIP joints.  No signs of active inflammation. Skin severe dryness and cracking at the tips of the fingers.  No spooning of the nails noted.  No sausage digits.   Results for orders placed or performed in visit on 05/12/18  HgB A1c  Result Value Ref Range   Hemoglobin A1C     HbA1c POC (<> result, manual entry)     HbA1c, POC (prediabetic range) 5.9 5.7 - 6.4 %   HbA1c, POC (controlled diabetic range)      ASSESSMENT AND PLAN:  1. COPD with acute exacerbation (HCC) - amoxicillin-clavulanate (AUGMENTIN) 875-125 MG tablet; Take 1 tablet by mouth every 12 (twelve) hours.  Dispense: 14 tablet; Refill: 0 - benzonatate (TESSALON) 100 MG capsule; Take 1 capsule (100 mg total) by mouth every 8 (eight) hours.  Dispense: 21 capsule; Refill: 0 - predniSONE (DELTASONE) 10 MG tablet; Take 3 tablets (30 mg total) by mouth daily.  Dispense: 15 tablet; Refill: 0  2. Sinus congestion Patient given [place] nasal spray to help decrease dryness of the nostrils.  She is told to avoid picking the nose. - sodium chloride (OCEAN) 0.65 % SOLN nasal spray; Place 1 spray into both nostrils as needed for congestion.  Dispense: 1 Bottle; Refill: 1  3. Dry skin Advised to purchase Curel Lotion OTC and use several times during the day.  Advised to put Vaseline on the hands at night - triamcinolone cream (KENALOG) 0.1 %; APPLY 1 APPLICATION TOPICALLY 2 TIMES DAILY.  Dispense: 30  g; Refill: 1  4. Arthralgia of both hands Does not seem consistent with psoriasis headache arthritis and certainly the skin issue does not seem consistent with psoriasis - Rheumatoid factor - CYCLIC CITRUL PEPTIDE ANTIBODY, IGG/IGA - Sedimentation Rate  5. Anxiety and depression - escitalopram (LEXAPRO) 20 MG tablet; Take 1 tablet (20 mg total) by mouth daily.  Dispense: 30 tablet; Refill: 3   Patient was given the opportunity to ask questions.  Patient verbalized understanding of the plan and was able to repeat key elements of the plan.   No orders of the defined types were placed in this encounter.    Requested Prescriptions   Pending Prescriptions Disp Refills  . atorvastatin (LIPITOR) 40 MG tablet 90 tablet 3    Sig: Take 1 tablet (40 mg total) by mouth daily.    No follow-ups on file.  Karle Plumber, MD, FACP

## 2018-06-21 LAB — CYCLIC CITRUL PEPTIDE ANTIBODY, IGG/IGA: CYCLIC CITRULLIN PEPTIDE AB: 5 U (ref 0–19)

## 2018-06-21 LAB — SEDIMENTATION RATE: Sed Rate: 13 mm/hr (ref 0–40)

## 2018-06-21 LAB — RHEUMATOID FACTOR: Rhuematoid fact SerPl-aCnc: 11.7 IU/mL (ref 0.0–13.9)

## 2018-06-22 MED FILL — AMOX-CLAV 875-125 MG TABLET: 875-125 | 7 days supply | Qty: 14 | Fill #0

## 2018-06-22 MED FILL — ?ATORVASTATIN 40MG TABLET: 40 | 90 days supply | Qty: 90 | Fill #0

## 2018-06-22 MED FILL — ESCITALOPRAM 20 MG TABLET: 20 | 30 days supply | Qty: 30 | Fill #0

## 2018-06-22 MED FILL — predniSONE 10 MG TABS: 10 | 5 days supply | Qty: 15 | Fill #0

## 2018-06-24 MED FILL — BRILINTA 90 MG TABLET: 90 | 30 days supply | Qty: 60 | Fill #4

## 2018-06-24 MED FILL — TRIAMCINOLONE 0.1% CREAM: 0.1 | 15 days supply | Qty: 30 | Fill #0

## 2018-06-30 ENCOUNTER — Ambulatory Visit: Payer: Self-pay | Attending: Family Medicine

## 2018-07-02 ENCOUNTER — Ambulatory Visit (HOSPITAL_COMMUNITY): Payer: Medicaid Other | Admitting: Licensed Clinical Social Worker

## 2018-07-16 ENCOUNTER — Other Ambulatory Visit: Payer: Self-pay | Admitting: Internal Medicine

## 2018-07-16 DIAGNOSIS — I10 Essential (primary) hypertension: Secondary | ICD-10-CM

## 2018-07-16 MED FILL — ESCITALOPRAM 20 MG TABLET: 20 | 30 days supply | Qty: 30 | Fill #1

## 2018-07-16 MED FILL — FUROSEMIDE 20 MG TABLET: 20 | 30 days supply | Qty: 30 | Fill #1

## 2018-07-16 MED FILL — $BREO ELLIPTA 200-25 MCG IN: 200-25 | 30 days supply | Qty: 60 | Fill #2

## 2018-07-16 MED FILL — MONTELUKAST SOD 10 MG TAB: 10 | 30 days supply | Qty: 30 | Fill #1

## 2018-07-20 MED FILL — LEVOTHYROXINE 75 MCG TABLET: 75 | 30 days supply | Qty: 30 | Fill #0

## 2018-07-20 MED FILL — LOSARTAN POTASSIUM 50 MG TA: 50 | 30 days supply | Qty: 45 | Fill #0

## 2018-07-20 MED FILL — CYCLOBENZAPRINE 10 MG TAB: 10 | 10 days supply | Qty: 30 | Fill #0

## 2018-07-21 ENCOUNTER — Ambulatory Visit (HOSPITAL_COMMUNITY): Payer: Medicaid Other | Admitting: Licensed Clinical Social Worker

## 2018-07-21 ENCOUNTER — Encounter (HOSPITAL_COMMUNITY): Payer: Self-pay

## 2018-07-22 ENCOUNTER — Other Ambulatory Visit: Payer: Self-pay | Admitting: Internal Medicine

## 2018-07-23 ENCOUNTER — Ambulatory Visit (HOSPITAL_COMMUNITY)
Admission: EM | Admit: 2018-07-23 | Discharge: 2018-07-23 | Disposition: A | Discharge status: Home / Self Care | Payer: Medicaid Other | Attending: Family Medicine | Admitting: Family Medicine

## 2018-07-23 ENCOUNTER — Encounter (HOSPITAL_COMMUNITY): Payer: Self-pay | Admitting: Family Medicine

## 2018-07-23 ENCOUNTER — Other Ambulatory Visit: Payer: Self-pay

## 2018-07-23 DIAGNOSIS — R062 Wheezing: Secondary | ICD-10-CM | POA: Insufficient documentation

## 2018-07-23 DIAGNOSIS — J0191 Acute recurrent sinusitis, unspecified: Secondary | ICD-10-CM | POA: Insufficient documentation

## 2018-07-23 MED ORDER — PREDNISONE 10 MG PO TABS: 40.0000 mg | ORAL_TABLET | Freq: Every day | ORAL | 0 refills | Status: DC

## 2018-07-23 NOTE — Discharge Instructions (Addendum)
Prednisone daily for the next 5 days This will help with the inflammation in the lungs and nasal passages. Flonase nasal spray Nasal saline Keep using your albuterol rescue inhaler as needed for cough, wheezing, shortness of breath. Follow up as needed for continued or worsening symptoms

## 2018-07-23 NOTE — ED Triage Notes (Signed)
Pt cc pt states she has COPD, sob , coughing and sinus drainage. X 4 . Pt states she's been seeing yellowish color mucus.

## 2018-07-23 NOTE — ED Notes (Signed)
Patient called in lobby with no answer

## 2018-07-23 NOTE — ED Provider Notes (Signed)
[place]    CSN: [id number] Arrival date & time: 07/23/18  1138     History   Chief Complaint Chief Complaint  Patient presents with  . Facial Pain    HPI [name] [name] is a 63 y.o. female.   Patient is a 63 year old female past medical history of anxiety, arthritis, asthma, COPD, CAD, depression, GERD, heart attack, diabetes, pneumonia.  She presents with approximately 4 days of coughing, shortness of breath with exertion, sinus congestion and sinus drainage.  She has had some yellowish colored mucus.  Her symptoms of been constant and run the same.  She is been using nasal saline, Flonase and antihistamine without much relief  of symptoms.  She is also been using her rescue inhaler and nebulizer without much relief of the cough, shortness of breath and wheezing. She was seen on 06/19/2018 and treated for COPD exacerbation by her primary care.  She did fill slightly better posttreatment but feels as if her symptoms have returned.  She denies any fever, chills, body aches, night sweats.  ROS per HPI      Past Medical History:  Diagnosis Date  . Anxiety    takes Lexapro daily  . Arthritis    "back, from neck down pass my bra area" (03/25/2017)  . Asthma   . Bartholin gland cyst 08/29/2011  . Bruises easily    pt is on Effient  . Chronic back pain    herniated nucleus pulposus  . Chronic back pain    "neck to bra area; lower back" (03/25/2017)  . COPD (chronic obstructive pulmonary disease) ([place])    early stages  . Coronary artery disease   . Depression    takes Klonopin daily  . Diabetes mellitus without complication ([place])   . Diverticulosis   . GERD (gastroesophageal reflux disease)    takes Nexium daily  . H/O hiatal hernia   . Heart attack ([place]) 2011  . Hemorrhoids   . Hernia   . Hyperlipidemia    takes Lipitor daily  . Hypertension    takes Losartan daily and Labetalol bid  . Hypothyroidism    takes Synthroid daily  . Insomnia    hydroxyzine  prn  . Joint pain   . Pneumonia    "couple times" (03/25/2017)  . Pre-diabetes    "just found out 1 wk ago" (03/25/2017)  . Psoriasis    elbows,knees,back  . Shortness of breath    with exertion  . Slowing of urinary stream   . Stress incontinence     Patient Active Problem List   Diagnosis Date Noted  . Insomnia 10/28/2017  . COPD with acute exacerbation ([place]) 09/15/2017  . Pre-diabetes 05/28/2017  . Emesis, persistent 05/28/2017  . QT prolongation 03/25/2017  . Encounter for screening mammogram for breast cancer 09/11/2015  . Psoriasis 06/22/2015  . COPD (chronic obstructive pulmonary disease) ([place]) 05/18/2015  . Anxiety and depression 07/10/2013  . Essential hypertension, benign 06/07/2013  . Dyslipidemia 06/07/2013  . Asthma, chronic 06/07/2013  . Emphysema lung ([place]) 06/07/2013  . Chronic back pain 06/07/2013  . CAD S/P percutaneous coronary angioplasty 06/07/2013  . GERD (gastroesophageal reflux disease) 06/07/2013  . Breast lump on left side at 3 o'clock position 10/13/2012  . S/P abdominal hysterectomy and right salpingo-oophorectomy 08/29/2011  . COPD exacerbation ([place]) 09/15/2009  . TOBACCO ABUSE 07/17/2009  . Chronic rhinitis 07/17/2009  . Lung nodule < 6cm on CT 07/17/2009  . ALLERGY, FOOD 07/17/2009  . Hypothyroidism 12/22/2008  Past Surgical History:  Procedure Laterality Date  . ABDOMINAL EXPLORATION SURGERY  1977  . ABDOMINAL HYSTERECTOMY  1977   "left one of my ovaries"  . BACK SURGERY    . COLONOSCOPY    . CORONARY ANGIOPLASTY WITH STENT PLACEMENT  2011 X2   "regular stents didn't work; had to go back in in ~ 1 month and put in medicated stents"  . DILATION AND CURETTAGE OF UTERUS    . ESOPHAGOGASTRODUODENOSCOPY    . LEFT HEART CATH AND CORONARY ANGIOGRAPHY N/A 03/26/2017   Procedure: LEFT HEART CATH AND CORONARY ANGIOGRAPHY;  Surgeon: Belva Crome, MD;  Location: [facility];  Service: Cardiovascular;  Laterality: N/A;  . LUMBAR  LAMINECTOMY/DECOMPRESSION MICRODISCECTOMY  08/05/2011   Procedure: LUMBAR LAMINECTOMY/DECOMPRESSION MICRODISCECTOMY;  Surgeon: Otilio Connors, MD;  Location: [facility];  Service: Neurosurgery;  Laterality: Right;  Right Lumbar four-five extraforaminal discectomy  . TONSILLECTOMY     as a child    OB History    Gravida  1   Para  1   Term  1   Preterm  0   AB  0   Living  1     SAB  0   TAB  0   Ectopic  0   Multiple  0   Live Births               Home Medications    Prior to Admission medications   Medication Sig Start Date End Date Taking? Authorizing Provider  albuterol (PROVENTIL HFA;VENTOLIN HFA) 108 (90 Base) MCG/ACT inhaler INHALE 2 PUFFS INTO THE LUNGS EVERY 6 (SIX) HOURS AS NEEDED FOR WHEEZING OR SHORTNESS OF BREATH. 06/15/18   Ladell Pier, MD  amoxicillin-clavulanate (AUGMENTIN) 875-125 MG tablet Take 1 tablet by mouth every 12 (twelve) hours. 06/19/18   Ladell Pier, MD  aspirin EC 81 MG tablet Take 81 mg by mouth daily.    [provider]  atorvastatin (LIPITOR) 40 MG tablet Take 1 tablet (40 mg total) by mouth at bedtime. 06/19/18   Ladell Pier, MD  benzonatate (TESSALON) 100 MG capsule Take 1 capsule (100 mg total) by mouth every 8 (eight) hours. 06/19/18   Ladell Pier, MD  Blood Glucose Monitoring Suppl (TRUE METRIX METER) DEVI 1 kit by Does not apply route 4 (four) times daily. 04/04/17   Ena Dawley, Tiffany S, PA-C  BRILINTA 90 MG TABS tablet TAKE 1 TABLET BY MOUTH 2 (TWO) TIMES DAILY. 07/22/18   Ladell Pier, MD  busPIRone (BUSPAR) 15 MG tablet Take 1 tablet (15 mg total) by mouth 3 (three) times daily. 06/09/18 06/09/19  Plovsky, Berneta Sages, MD  Calcium 600 MG tablet Take 600 mg by mouth daily.  07/14/13   Patrecia Pour, NP  cyclobenzaprine (FLEXERIL) 10 MG tablet TAKE 1 TABLET BY MOUTH 3 TIMES DAILY AS NEEDED. 07/18/18   Ladell Pier, MD  escitalopram (LEXAPRO) 20 MG tablet Take 1 tablet (20 mg total) by mouth  daily. 06/19/18   Ladell Pier, MD  fluticasone (FLONASE) 50 MCG/ACT nasal spray Place 2 sprays into both nostrils daily. 09/18/17   Ladell Pier, MD  fluticasone furoate-vilanterol (BREO ELLIPTA) 200-25 MCG/INH AEPB Inhale 1 puff into the lungs daily. 05/01/18   Ladell Pier, MD  furosemide (LASIX) 20 MG tablet TAKE 1 TABLET BY MOUTH DAILY. 06/15/18   Ladell Pier, MD  gabapentin (NEURONTIN) 600 MG tablet TAKE 1 TABLET BY MOUTH 4 TIMES DAILY 01/21/18  Ladell Pier, MD  glucose blood (TRUE METRIX BLOOD GLUCOSE TEST) test strip Use as instructed 01/21/18   Ladell Pier, MD  hydrOXYzine (VISTARIL) 25 MG capsule TAKE 25MG BY MOUTH THREE TIMES DAILY Patient not taking: Reported on 06/09/2018 05/28/17   Elsie Stain, MD  ipratropium-albuterol (DUONEB) 0.5-2.5 (3) MG/3ML SOLN Take 3 mLs every 6 (six) hours as needed by nebulization. Patient taking differently: Take 3 mLs by nebulization every 6 (six) hours as needed (wheezing).  05/28/17   Elsie Stain, MD  labetalol (NORMODYNE) 100 MG tablet Take 1 tablet (100 mg total) by mouth 2 (two) times daily. 10/28/17   Ladell Pier, MD  levothyroxine (SYNTHROID, LEVOTHROID) 75 MCG tablet TAKE 1 TABLET (75 MCG TOTAL) BY MOUTH DAILY. 07/18/18   Ladell Pier, MD  loratadine (CLARITIN) 10 MG tablet Take 1 tablet (10 mg total) by mouth daily. 09/18/17   Ladell Pier, MD  losartan (COZAAR) 50 MG tablet TAKE 1 AND 1/2 TABLETS BY MOUTH DAILY. 07/18/18   Ladell Pier, MD  mirtazapine (REMERON) 30 MG tablet Take 0.5 tablets (15 mg total) by mouth at bedtime. 06/09/18 06/09/19  Plovsky, Berneta Sages, MD  montelukast (SINGULAIR) 10 MG tablet TAKE 1 TABLET BY MOUTH AT BEDTIME. 06/15/18   Ladell Pier, MD  omeprazole (PRILOSEC) 40 MG capsule TAKE 1 CAPSULE BY MOUTH DAILY. 03/30/18   Ladell Pier, MD  ondansetron (ZOFRAN) 4 MG tablet Take 1 tablet (4 mg total) every 8 (eight) hours as needed by mouth for nausea or  vomiting. 05/28/17   Elsie Stain, MD  predniSONE (DELTASONE) 10 MG tablet Take 4 tablets (40 mg total) by mouth daily for 5 days. 07/23/18 07/28/18  Loura Halt A, NP  ranitidine (ZANTAC) 150 MG tablet Take 1 tablet (150 mg total) by mouth 2 (two) times daily. 05/12/18   Brayton Caves, PA-C  sodium chloride (OCEAN) 0.65 % SOLN nasal spray Place 1 spray into both nostrils as needed for congestion. 06/19/18   Ladell Pier, MD  triamcinolone cream (KENALOG) 0.1 % APPLY 1 APPLICATION TOPICALLY 2 TIMES DAILY. 06/19/18   Ladell Pier, MD  TRUEPLUS LANCETS 28G MISC 28 g by Does not apply route QID. 04/04/17   Brayton Caves, PA-C  umeclidinium bromide (INCRUSE ELLIPTA) 62.5 MCG/INH AEPB Inhale 1 puff into the lungs daily. 05/12/18   Brayton Caves, PA-C    Family History Family History  Problem Relation Age of Onset  . Heart disease Mother   . Hypertension Mother   . Stroke Mother   . Mental illness Mother   . Heart disease Father   . Hypertension Father   . Diabetes Father   . Cancer Maternal Aunt        breast  . Thyroid disease Paternal Aunt   . Heart disease Maternal Grandfather   . Anesthesia problems Daughter   . Cancer Paternal Grandmother        mouth  . Hypotension Neg Hx   . Malignant hyperthermia Neg Hx   . Pseudochol deficiency Neg Hx     Social History Social History   Tobacco Use  . Smoking status: Current Every Day Smoker    Packs/day: 0.50    Years: 49.00    Pack years: 24.50    Types: Cigarettes  . Smokeless tobacco: Never Used  Substance Use Topics  . Alcohol use: Not Currently  . Drug use: No    Types: Cocaine    Comment:  used recently     Allergies   Avocado; Latex; and Codeine   Review of Systems Review of Systems   Physical Exam Triage Vital Signs ED Triage Vitals  Enc Vitals Group     BP 07/23/18 1220 (!) 150/90     Pulse Rate 07/23/18 1220 63     Resp 07/23/18 1220 18     Temp 07/23/18 1220 97.7 F (36.5 C)     Temp  Source 07/23/18 1220 Oral     SpO2 07/23/18 1220 98 %     Weight 07/23/18 1218 126 lb (57.2 kg)     Height --      Head Circumference --      Peak Flow --      Pain Score 07/23/18 1218 8     Pain Loc --      Pain Edu? --      Excl. in [place]? --    No data found.  Updated Vital Signs BP (!) 150/90 (BP Location: Right Arm)   Pulse 63   Temp 97.7 F (36.5 C) (Oral)   Resp 18   Wt 126 lb (57.2 kg)   SpO2 98%   BMI 23.42 kg/m   Visual Acuity Right Eye Distance:   Left Eye Distance:   Bilateral Distance:    Right Eye Near:   Left Eye Near:    Bilateral Near:     Physical Exam Vitals signs and nursing note reviewed.  Constitutional:      General: She is not in acute distress.    Appearance: Normal appearance. She is not ill-appearing or diaphoretic.  HENT:     Head: Normocephalic and atraumatic.     Right Ear: Tympanic membrane, ear canal and external ear normal.     Left Ear: Tympanic membrane, ear canal and external ear normal.     Nose: Congestion and rhinorrhea present.     Mouth/Throat:     Pharynx: Oropharynx is clear.  Eyes:     Conjunctiva/sclera: Conjunctivae normal.  Neck:     Musculoskeletal: Normal range of motion.  Cardiovascular:     Rate and Rhythm: Normal rate and regular rhythm.     Pulses: Normal pulses.  Pulmonary:     Effort: Pulmonary effort is normal.     Breath sounds: Wheezing present.  Musculoskeletal: Normal range of motion.  Lymphadenopathy:     Cervical: No cervical adenopathy.  Skin:    General: Skin is warm and dry.  Neurological:     Mental Status: She is alert.  Psychiatric:        Mood and Affect: Mood normal.      UC Treatments / Results  Labs (all labs ordered are listed, but only abnormal results are displayed) Labs Reviewed - No data to display  EKG None  Radiology No results found.  Procedures Procedures (including critical care time)  Medications Ordered in UC Medications - No data to display  Initial  Impression / Assessment and Plan / UC Course  I have reviewed the triage vital signs and the nursing notes.  Pertinent labs & imaging results that were available during my care of the patient were reviewed by me and considered in my medical decision making (see chart for details).     Patient is a 63 year old female past medical history of COPD.  Lungs reveal diffuse expiratory wheezing.  She is also having some sinus congestion.  We will treat with 5 days of prednisone daily.  She can use her  Flonase and nasal saline daily.  She can also continue to use her rescue inhaler as needed for cough, wheezing, shortness of breath. For continued or worsening symptoms she needs a follow-up with her primary care provider Patient understanding and agreeable to plan Final Clinical Impressions(s) / UC Diagnoses   Final diagnoses:  Acute recurrent sinusitis, unspecified location  Wheezing     Discharge Instructions     Prednisone daily for the next 5 days This will help with the inflammation in the lungs and nasal passages. Flonase nasal spray Nasal saline Keep using your albuterol rescue inhaler as needed for cough, wheezing, shortness of breath. Follow up as needed for continued or worsening symptoms     ED Prescriptions    Medication Sig Dispense Auth. Provider   predniSONE (DELTASONE) 10 MG tablet Take 4 tablets (40 mg total) by mouth daily for 5 days. 20 tablet Loura Halt A, NP     Controlled Substance Prescriptions [name] [name] Controlled Substance Registry consulted? Not Applicable   Orvan July, NP 07/23/18 1920

## 2018-08-06 ENCOUNTER — Ambulatory Visit (INDEPENDENT_AMBULATORY_CARE_PROVIDER_SITE_OTHER): Payer: Medicaid Other | Admitting: Licensed Clinical Social Worker

## 2018-08-06 ENCOUNTER — Encounter (HOSPITAL_COMMUNITY): Payer: Self-pay | Admitting: Licensed Clinical Social Worker

## 2018-08-06 DIAGNOSIS — F411 Generalized anxiety disorder: Secondary | ICD-10-CM

## 2018-08-06 DIAGNOSIS — F329 Major depressive disorder, single episode, unspecified: Secondary | ICD-10-CM

## 2018-08-06 DIAGNOSIS — F32A Depression, unspecified: Secondary | ICD-10-CM

## 2018-08-06 NOTE — Progress Notes (Signed)
   THERAPIST PROGRESS NOTE  Session Time: 3:40-4:30pm  Participation Level: Active  Behavioral Response: CasualAlertAnxious  Type of Therapy: Individual Therapy  Treatment Goals addressed: Improve Psychiatric Symptoms, elevate mood (increased self-esteem, increased self-compassion, increased interaction), improve unhelpful thought patterns, controlled behavior, moderate mood, deliberate speech and thought process(improved social functioning, healthy adjustment to living situation), Learn about diagnosis, healthy coping skills.  Interventions: CBT/ TX plan update  Summary: [name] [name] is a 63 y.o. female who presents for her individual counseling session. Reviewed and updated tx plan. Pt discussed her psychiatric symptoms and current life events. Pt presented anxious, she is having a relapse of psoriatic arthritis. She has sores all over her hands. Pt is uninsured. Went online and found some websites for pt to get Humira at a cheaper price. Pt will get her daughter to go online to look at a the websites. Pt moved into her new apt for older adults. She likes it ok but "theyre a lot of old people that live there." Discussed the benefits of widening her relationship circle. 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 Pt's daughter is supporting her. She gets her Social Security which does not cover her rent. Asked open ended questions and processed her feelings around her daughter's assistance. Pt still continues to smoke and has COPD. Pt is still not interested in smoking cessation. Pt and her daughter attended a service at [facility] for loss of a loved one (her mother) in 2019. Discussed with pt the continued suggestion that she attend grief counseling at [facility]osp[facility].  Suicidal/Homicidal: Nowithout intent/plan  Therapist Response: Assessed pt's current functioning and reviewed progress. Assisted pt processing new living situation,,smoking with COPD dx., relationship circle, new residence,  daughter's financial assistance and reviewed tx plan.  Assisted pt processing for the management of her stressors.  Plan: Return again in 2 weeks. Grief and loss of her mother   Diagnosis: Axis I: GAD, Depression, Unspecified Depression Type    [name] [name] S, LCAS 08/06/2018

## 2018-08-07 ENCOUNTER — Ambulatory Visit (HOSPITAL_COMMUNITY): Payer: Self-pay | Admitting: Psychiatry

## 2018-08-10 MED FILL — BRILINTA 90 MG TABLET: 90 | 30 days supply | Qty: 60 | Fill #0

## 2018-08-10 MED FILL — FUROSEMIDE 20 MG TABLET: 20 | 30 days supply | Qty: 30 | Fill #2

## 2018-08-10 MED FILL — ESCITALOPRAM 20 MG TABLET: 20 | 30 days supply | Qty: 30 | Fill #2

## 2018-08-10 MED FILL — LABETALOL HCL 100 MG TABS: 100 | 30 days supply | Qty: 60 | Fill #6

## 2018-08-10 MED FILL — MONTELUKAST SOD 10 MG TAB: 10 | 30 days supply | Qty: 30 | Fill #2

## 2018-08-14 ENCOUNTER — Ambulatory Visit: Payer: Self-pay | Admitting: Internal Medicine

## 2018-08-14 ENCOUNTER — Ambulatory Visit (HOSPITAL_COMMUNITY): Payer: Medicaid Other | Admitting: Psychiatry

## 2018-08-18 ENCOUNTER — Other Ambulatory Visit: Payer: Self-pay | Admitting: Internal Medicine

## 2018-08-18 ENCOUNTER — Other Ambulatory Visit: Payer: Self-pay

## 2018-08-18 MED ORDER — CYCLOBENZAPRINE HCL 10 MG PO TABS: 10.0000 mg | ORAL_TABLET | Freq: Three times a day (TID) | ORAL | 0 refills | Status: DC | PRN

## 2018-08-18 MED ORDER — LEVOTHYROXINE SODIUM 75 MCG PO TABS: 75.0000 ug | ORAL_TABLET | Freq: Every day | ORAL | 0 refills | Status: DC

## 2018-08-18 MED ORDER — ALBUTEROL SULFATE HFA 108 (90 BASE) MCG/ACT IN AERS: INHALATION_SPRAY | RESPIRATORY_TRACT | 0 refills | Status: DC

## 2018-08-18 MED FILL — LEVOTHYROXINE 75 MCG TABLET: 75 | 30 days supply | Qty: 30 | Fill #0

## 2018-08-18 MED FILL — $VENTOLIN HFA 18G INHALER: 108 (90 BAS | 25 days supply | Qty: 18 | Fill #0

## 2018-08-18 MED FILL — LOSARTAN POTASSIUM 50 MG TA: 50 | 30 days supply | Qty: 45 | Fill #1

## 2018-08-18 MED FILL — CYCLOBENZAPRINE 10 MG TAB: 10 | 10 days supply | Qty: 30 | Fill #0

## 2018-08-18 NOTE — Telephone Encounter (Signed)
Refill request

## 2018-08-19 ENCOUNTER — Ambulatory Visit (INDEPENDENT_AMBULATORY_CARE_PROVIDER_SITE_OTHER): Payer: Medicaid Other | Admitting: Licensed Clinical Social Worker

## 2018-08-19 ENCOUNTER — Encounter (HOSPITAL_COMMUNITY): Payer: Self-pay | Admitting: Licensed Clinical Social Worker

## 2018-08-19 DIAGNOSIS — F329 Major depressive disorder, single episode, unspecified: Secondary | ICD-10-CM

## 2018-08-19 DIAGNOSIS — F411 Generalized anxiety disorder: Secondary | ICD-10-CM

## 2018-08-19 DIAGNOSIS — F32A Depression, unspecified: Secondary | ICD-10-CM

## 2018-08-19 NOTE — Progress Notes (Signed)
   THERAPIST PROGRESS NOTE  Session Time: 3:10-4:00pm  Participation Level: Active  Behavioral Response: CasualAlertAnxious  Type of Therapy: Individual Therapy  Treatment Goals addressed: Improve Psychiatric Symptoms, elevate mood (increased self-esteem, increased self-compassion, increased interaction), improve unhelpful thought patterns, controlled behavior, moderate mood, deliberate speech and thought process(improved social functioning, healthy adjustment to living situation), Learn about diagnosis, healthy coping skills.  Interventions: CBT/ TX plan update  Summary: [name] [name] is a 63 y.o. female who presents for her individual counseling session.Pt discussed her psychiatric symptoms and current life events. 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 Pt is liking her living situation better. She is reading from their Geneticist, molecular and at smoke breaks she talks to the ladies that also smoke. Pt continues to not be interested in smoking cessation.  Pt has transportation problems. She takes [employer] to/from work. Pt is unable to attend grief counseling at Hospice due to lack of transportation. Discussed stages of grief with pt. Pt still continues to have problems sleeping. Educated pt on sleep hygiene. Talked with CMA Regan to let Dr. Casimiro Needle, psychiatrist, know. Showed pt how to access Youtube Guided meditation for sleep. Completed a guided meditation in session and gave pt instructions how to access Youtube.   Suicidal/Homicidal: Nowithout intent/plan  Therapist Response: Assessed pt's current functioning and reviewed progress. Assisted pt processing sleep issues, transportation problems, stages of grief, guided meditation for sleep, sleep hygiene.  Assisted pt processing for the management of her stressors.  Plan: Return again in 2 weeks. Grief and loss of her mother   Diagnosis: Axis I: GAD, Depression, Unspecified Depression Type    Yancy Knoble S,  LCAS 08/19/2018

## 2018-08-20 ENCOUNTER — Telehealth (HOSPITAL_COMMUNITY): Payer: Self-pay

## 2018-08-20 NOTE — Telephone Encounter (Signed)
Patient came in for therapy and reported that she is not sleeping. Patient is currently on Remeron 15 mg and states she is up and down all night. Please review and advise, thank you

## 2018-08-25 ENCOUNTER — Encounter (HOSPITAL_COMMUNITY): Payer: Self-pay | Admitting: Emergency Medicine

## 2018-08-25 ENCOUNTER — Ambulatory Visit (HOSPITAL_COMMUNITY)
Admission: EM | Admit: 2018-08-25 | Discharge: 2018-08-25 | Disposition: A | Discharge status: Home / Self Care | Payer: Medicaid Other | Attending: Family Medicine | Admitting: Family Medicine

## 2018-08-25 DIAGNOSIS — J441 Chronic obstructive pulmonary disease with (acute) exacerbation: Secondary | ICD-10-CM

## 2018-08-25 DIAGNOSIS — R0981 Nasal congestion: Secondary | ICD-10-CM

## 2018-08-25 MED ORDER — PREDNISONE 20 MG PO TABS: 20.0000 mg | ORAL_TABLET | Freq: Two times a day (BID) | ORAL | 0 refills | Status: DC

## 2018-08-25 MED ORDER — AZITHROMYCIN 250 MG PO TABS: 250.0000 mg | ORAL_TABLET | Freq: Every day | ORAL | 0 refills | Status: DC

## 2018-08-25 NOTE — ED Triage Notes (Signed)
Pt presents to [facility] for assessment of cough, congestion, upset stomach, some chills, body aches, headache and sinus pressure since Saturday.

## 2018-08-25 NOTE — ED Provider Notes (Signed)
[place]   [id number] 08/25/18 Arrival Time: 5329  Cc: COUGH  SUBJECTIVE:  [name] [name] is a 63 y.o. female hx significant for CAD, HTN, COPD, GERD, hypothyroid, pneumonia, HLD, DM, MI, who presents with worsening nasal congestion, sinus pain/ pressure, productive cough, and wheezing x 3 days.  Denies positive sick exposure or precipitating event.  Describes cough as intermittent and productive.  Has tried OTC medications without relief.  Reports previous symptoms in the past.  Complains of fatigue, and chills.  Denies fever, SOB, chest pain, nausea, changes in bowel or bladder habits.    ROS: As per HPI.  Past Medical History:  Diagnosis Date  . Anxiety    takes Lexapro daily  . Arthritis    "back, from neck down pass my bra area" (03/25/2017)  . Asthma   . Bartholin gland cyst 08/29/2011  . Bruises easily    pt is on Effient  . Chronic back pain    herniated nucleus pulposus  . Chronic back pain    "neck to bra area; lower back" (03/25/2017)  . COPD (chronic obstructive pulmonary disease) ([place])    early stages  . Coronary artery disease   . Depression    takes Klonopin daily  . Diabetes mellitus without complication ([place])   . Diverticulosis   . GERD (gastroesophageal reflux disease)    takes Nexium daily  . H/O hiatal hernia   . Heart attack ([place]) 2011  . Hemorrhoids   . Hernia   . Hyperlipidemia    takes Lipitor daily  . Hypertension    takes Losartan daily and Labetalol bid  . Hypothyroidism    takes Synthroid daily  . Insomnia    hydroxyzine prn  . Joint pain   . Pneumonia    "couple times" (03/25/2017)  . Pre-diabetes    "just found out 1 wk ago" (03/25/2017)  . Psoriasis    elbows,knees,back  . Shortness of breath    with exertion  . Slowing of urinary stream   . Stress incontinence    Past Surgical History:  Procedure Laterality Date  . ABDOMINAL EXPLORATION SURGERY  1977  . ABDOMINAL HYSTERECTOMY  1977   "left one of my ovaries"  .  BACK SURGERY    . COLONOSCOPY    . CORONARY ANGIOPLASTY WITH STENT PLACEMENT  2011 X2   "regular stents didn't work; had to go back in in ~ 1 month and put in medicated stents"  . DILATION AND CURETTAGE OF UTERUS    . ESOPHAGOGASTRODUODENOSCOPY    . LEFT HEART CATH AND CORONARY ANGIOGRAPHY N/A 03/26/2017   Procedure: LEFT HEART CATH AND CORONARY ANGIOGRAPHY;  Surgeon: Belva Crome, MD;  Location: [facility];  Service: Cardiovascular;  Laterality: N/A;  . LUMBAR LAMINECTOMY/DECOMPRESSION MICRODISCECTOMY  08/05/2011   Procedure: LUMBAR LAMINECTOMY/DECOMPRESSION MICRODISCECTOMY;  Surgeon: Otilio Connors, MD;  Location: [facility];  Service: Neurosurgery;  Laterality: Right;  Right Lumbar four-five extraforaminal discectomy  . TONSILLECTOMY     as a child   Allergies  Allergen Reactions  . Avocado Anaphylaxis  . Latex Shortness Of Breath and Rash  . Codeine Nausea Only   Current Facility-Administered Medications on File Prior to Encounter  Medication Dose Route Frequency Provider Last Rate Last Dose  . doxepin (SINEQUAN) capsule 25 mg  25 mg Oral QHS Norma Fredrickson, MD       Current Outpatient Medications on File Prior to Encounter  Medication Sig Dispense Refill  . albuterol (PROVENTIL  HFA;VENTOLIN HFA) 108 (90 Base) MCG/ACT inhaler INHALE 2 PUFFS INTO THE LUNGS EVERY 6 (SIX) HOURS AS NEEDED FOR WHEEZING OR SHORTNESS OF BREATH. 18 g 0  . atorvastatin (LIPITOR) 40 MG tablet Take 1 tablet (40 mg total) by mouth at bedtime. 90 tablet 2  . BRILINTA 90 MG TABS tablet TAKE 1 TABLET BY MOUTH 2 (TWO) TIMES DAILY. 60 tablet 6  . busPIRone (BUSPAR) 15 MG tablet Take 1 tablet (15 mg total) by mouth 3 (three) times daily. 270 tablet 1  . cyclobenzaprine (FLEXERIL) 10 MG tablet Take 1 tablet (10 mg total) by mouth 3 (three) times daily as needed. 30 tablet 0  . escitalopram (LEXAPRO) 20 MG tablet Take 1 tablet (20 mg total) by mouth daily. 30 tablet 3  . fluticasone (FLONASE) 50 MCG/ACT  nasal spray Place 2 sprays into both nostrils daily. 16 g 6  . fluticasone furoate-vilanterol (BREO ELLIPTA) 200-25 MCG/INH AEPB Inhale 1 puff into the lungs daily. 60 each 2  . furosemide (LASIX) 20 MG tablet TAKE 1 TABLET BY MOUTH DAILY. 30 tablet 2  . gabapentin (NEURONTIN) 600 MG tablet TAKE 1 TABLET BY MOUTH 4 TIMES DAILY 120 tablet 3  . ipratropium-albuterol (DUONEB) 0.5-2.5 (3) MG/3ML SOLN Take 3 mLs every 6 (six) hours as needed by nebulization. (Patient taking differently: Take 3 mLs by nebulization every 6 (six) hours as needed (wheezing). ) 360 mL 3  . labetalol (NORMODYNE) 100 MG tablet Take 1 tablet (100 mg total) by mouth 2 (two) times daily. 60 tablet 6  . levothyroxine (SYNTHROID, LEVOTHROID) 75 MCG tablet Take 1 tablet (75 mcg total) by mouth daily. 30 tablet 0  . loratadine (CLARITIN) 10 MG tablet Take 1 tablet (10 mg total) by mouth daily. 30 tablet 11  . losartan (COZAAR) 50 MG tablet TAKE 1 AND 1/2 TABLETS BY MOUTH DAILY. 45 tablet 2  . mirtazapine (REMERON) 30 MG tablet Take 0.5 tablets (15 mg total) by mouth at bedtime. 90 tablet 1  . montelukast (SINGULAIR) 10 MG tablet TAKE 1 TABLET BY MOUTH AT BEDTIME. 30 tablet 2  . omeprazole (PRILOSEC) 40 MG capsule TAKE 1 CAPSULE BY MOUTH DAILY. 30 capsule 2  . ranitidine (ZANTAC) 150 MG tablet Take 1 tablet (150 mg total) by mouth 2 (two) times daily. 60 tablet 3  . triamcinolone cream (KENALOG) 0.1 % APPLY 1 APPLICATION TOPICALLY 2 TIMES DAILY. 30 g 1  . umeclidinium bromide (INCRUSE ELLIPTA) 62.5 MCG/INH AEPB Inhale 1 puff into the lungs daily. 1 each 6  . aspirin EC 81 MG tablet Take 81 mg by mouth daily.    . Blood Glucose Monitoring Suppl (TRUE METRIX METER) DEVI 1 kit by Does not apply route 4 (four) times daily. 1 Device 0  . Calcium 600 MG tablet Take 600 mg by mouth daily.  60 tablet   . glucose blood (TRUE METRIX BLOOD GLUCOSE TEST) test strip Use as instructed 100 each 12  . ondansetron (ZOFRAN) 4 MG tablet Take 1 tablet  (4 mg total) every 8 (eight) hours as needed by mouth for nausea or vomiting. 20 tablet 0  . TRUEPLUS LANCETS 28G MISC 28 g by Does not apply route QID. 120 each 2    Social History   Socioeconomic History  . Marital status: Divorced    Spouse name: Not on file  . Number of children: Not on file  . Years of education: Not on file  . Highest education level: Not on file  Occupational History  .  Not on file  Social Needs  . Financial resource strain: Not hard at all  . Food insecurity:    Worry: Never true    Inability: Never true  . Transportation needs:    Medical: No    Non-medical: No  Tobacco Use  . Smoking status: Current Every Day Smoker    Packs/day: 0.50    Years: 49.00    Pack years: 24.50    Types: Cigarettes  . Smokeless tobacco: Never Used  Substance and Sexual Activity  . Alcohol use: Not Currently  . Drug use: No    Types: Cocaine    Comment: used recently  . Sexual activity: Not Currently    Birth control/protection: Surgical  Lifestyle  . Physical activity:    Days per week: 7 days    Minutes per session: 10 min  . Stress: Only a little  Relationships  . Social connections:    Talks on phone: Not on file    Gets together: Not on file    Attends religious service: Not on file    Active member of club or organization: Not on file    Attends meetings of clubs or organizations: Not on file    Relationship status: Not on file  . Intimate partner violence:    Fear of current or ex partner: Not on file    Emotionally abused: Not on file    Physically abused: Not on file    Forced sexual activity: Not on file  Other Topics Concern  . Not on file  Social History Narrative  . Not on file   Family History  Problem Relation Age of Onset  . Heart disease Mother   . Hypertension Mother   . Stroke Mother   . Mental illness Mother   . Heart disease Father   . Hypertension Father   . Diabetes Father   . Cancer Maternal Aunt        breast  . Thyroid  disease Paternal Aunt   . Heart disease Maternal Grandfather   . Anesthesia problems Daughter   . Cancer Paternal Grandmother        mouth  . Hypotension Neg Hx   . Malignant hyperthermia Neg Hx   . Pseudochol deficiency Neg Hx      OBJECTIVE:  Vitals:   08/25/18 1427  BP: 134/85  Pulse: 76  Resp: 20  Temp: 97.7 F (36.5 C)  TempSrc: Temporal  SpO2: 100%     General appearance: Alert, fatigue appearing, appears older than stated age, speaking in full sentences without difficulty HEENT:NCAT; Ears: EACs clear, TMs pearly gray; Eyes: PERRL.  EOM grossly intact. Nose: nares patent with rhinorrhea, turbinates mild swollen and erythematous; Throat: tonsils nonerythematous or enlarged, uvula midline  Neck: supple without LAD Lungs: diffuse wheezes heard throughout bilateral lung fields Heart: regular rate and rhythm.  Radial pulses 2+ symmetrical bilaterally Skin: warm and dry Psychological: alert and cooperative; normal mood and affect  ASSESSMENT & PLAN:  1. COPD exacerbation ([place])   2. Nasal congestion     Meds ordered this encounter  Medications  . azithromycin (ZITHROMAX) 250 MG tablet    Sig: Take 1 tablet (250 mg total) by mouth daily. Take first 2 tablets together, then 1 every day until finished.    Dispense:  6 tablet    Refill:  0    Order Specific Question:   Supervising Provider    Answer:   Raylene Everts [[id number]]  . predniSONE (DELTASONE) 20  MG tablet    Sig: Take 1 tablet (20 mg total) by mouth 2 (two) times daily with a meal for 5 days.    Dispense:  10 tablet    Refill:  0    Order Specific Question:   Supervising Provider    Answer:   Raylene Everts [[id number]]    Declines duo-neb in office.  Will do duo-neb at home Continue with inhaler as needed for shortness of breath or wheezing Continue with flonase, nasal saline and OTC zyrtec as needed for runny nose and congestion Get plenty of rest and push fluids Prednisone prescribed.  Take as  directed and to completion Azithromycin prescribed.  Take as directed and to completion Follow up with PCP for recheck and/or if symptoms persists Return or go to ER if you have any new or worsening symptoms such as fever, chills, fatigue, shortness of breath, wheezing, chest pain, nausea, changes in bowel or bladder habits, etc...   Reviewed expectations re: course of current medical issues. Questions answered. Outlined signs and symptoms indicating need for more acute intervention. Patient verbalized understanding. After Visit Summary given.          Lestine Box, PA-C 08/25/18 1537

## 2018-08-25 NOTE — ED Notes (Signed)
Patient able to ambulate independently  

## 2018-08-25 NOTE — Discharge Instructions (Signed)
Declines duo-neb in office.  Will do duo-neb at home Continue with inhaler as needed for shortness of breath or wheezing Continue with flonase, nasal saline and OTC zyrtec as needed for runny nose and congestion Get plenty of rest and push fluids Prednisone prescribed.  Take as directed and to completion Azithromycin prescribed.  Take as directed and to completion Follow up with PCP for recheck and/or if symptoms persists Return or go to ER if you have any new or worsening symptoms such as fever, chills, fatigue, shortness of breath, wheezing, chest pain, nausea, changes in bowel or bladder habits, etc..Marland Kitchen

## 2018-09-02 ENCOUNTER — Ambulatory Visit (HOSPITAL_COMMUNITY): Payer: Medicaid Other | Admitting: Licensed Clinical Social Worker

## 2018-09-04 MED ORDER — DOXEPIN HCL 25 MG PO CAPS: 25.0000 mg | ORAL_CAPSULE | Freq: Every day | ORAL | Status: DC

## 2018-09-04 NOTE — Telephone Encounter (Signed)
Called in  Higher Doxepin dose  25 mg  If not working ask pt to add Melatonine  3 mg

## 2018-09-16 ENCOUNTER — Ambulatory Visit (HOSPITAL_COMMUNITY): Payer: Medicaid Other | Admitting: Licensed Clinical Social Worker

## 2018-09-17 ENCOUNTER — Encounter: Payer: Self-pay | Admitting: Internal Medicine

## 2018-09-17 ENCOUNTER — Ambulatory Visit: Payer: Self-pay | Attending: Internal Medicine | Admitting: Internal Medicine

## 2018-09-17 VITALS — BP 120/68 | HR 76 | Temp 98.5°F | Resp 16 | Wt 133.0 lb

## 2018-09-17 DIAGNOSIS — J449 Chronic obstructive pulmonary disease, unspecified: Secondary | ICD-10-CM

## 2018-09-17 DIAGNOSIS — I2510 Atherosclerotic heart disease of native coronary artery without angina pectoris: Secondary | ICD-10-CM

## 2018-09-17 DIAGNOSIS — K219 Gastro-esophageal reflux disease without esophagitis: Secondary | ICD-10-CM

## 2018-09-17 DIAGNOSIS — F319 Bipolar disorder, unspecified: Secondary | ICD-10-CM

## 2018-09-17 DIAGNOSIS — E039 Hypothyroidism, unspecified: Secondary | ICD-10-CM

## 2018-09-17 DIAGNOSIS — J4489 Other specified chronic obstructive pulmonary disease: Secondary | ICD-10-CM

## 2018-09-17 DIAGNOSIS — I10 Essential (primary) hypertension: Secondary | ICD-10-CM

## 2018-09-17 DIAGNOSIS — J0101 Acute recurrent maxillary sinusitis: Secondary | ICD-10-CM

## 2018-09-17 DIAGNOSIS — F17200 Nicotine dependence, unspecified, uncomplicated: Secondary | ICD-10-CM

## 2018-09-17 MED ORDER — NICOTINE 21 MG/24HR TD PT24: 21.0000 mg | MEDICATED_PATCH | Freq: Every day | TRANSDERMAL | 1 refills | Status: DC

## 2018-09-17 MED ORDER — GABAPENTIN 600 MG PO TABS: ORAL_TABLET | ORAL | 3 refills | Status: DC

## 2018-09-17 MED ORDER — FLUTICASONE FUROATE-VILANTEROL 200-25 MCG/INH IN AEPB: 1.0000 | INHALATION_SPRAY | Freq: Every day | RESPIRATORY_TRACT | 6 refills | Status: DC

## 2018-09-17 MED ORDER — DOXYCYCLINE HYCLATE 100 MG PO TABS: 100.0000 mg | ORAL_TABLET | Freq: Two times a day (BID) | ORAL | 0 refills | Status: DC

## 2018-09-17 MED ORDER — SODIUM CHLORIDE-SODIUM BICARB 2300-700 MG NA KIT: PACK | NASAL | 0 refills | Status: DC

## 2018-09-17 MED ORDER — OMEPRAZOLE 40 MG PO CPDR: 40.0000 mg | DELAYED_RELEASE_CAPSULE | Freq: Every day | ORAL | 4 refills | Status: DC

## 2018-09-17 MED FILL — DOXYCYCLINE HYCLATE 100 MG: 100 | 10 days supply | Qty: 20 | Fill #0

## 2018-09-17 MED FILL — GABAPENTIN 600 MG TABLET: 600 | 30 days supply | Qty: 120 | Fill #0

## 2018-09-17 MED FILL — $BREO ELLIPTA 200-25 MCG IN: 200-25 | 90 days supply | Qty: 180 | Fill #0

## 2018-09-17 MED FILL — NICOTINE 21 MG/24HR PATCH: 21 | 28 days supply | Qty: 28 | Fill #0

## 2018-09-17 MED FILL — OMEPRAZOLE DR 40 MG CAPSULE: 40 | 30 days supply | Qty: 30 | Fill #0

## 2018-09-17 NOTE — Progress Notes (Signed)
Patient ID: [name] [name], female    DOB: Oct 05, 1955  MRN: [id number]  CC: URI   Subjective: [name] [name] is a 63 y.o. female who presents for chronic ds management Her concerns today include:  Patient with history ofpre-DM, GERD, hypothyroidism, HTN, HL, CAD with previous stent, COPD, substance abuse and tobacco dependence  Since last visit with me, patient was seen in the ED in January for acute recurrent sinusitis.  She was treated with prednisone and given prescription for Flonase nasal spray and nasal saline.  Seen again 1 month ago with COPD exacerbation and nasal congestion.  She was treated with Zithromax and prednisone.   -She feels a little better but still has discolored mucus from the nose.  She is taking over-the-counter allergy pills.  Nose is very congested on the right side and has a hard time breathing out of that side.  She had also noticed a knot in front of the right ear several weeks ago that has subsequently resolved.  She denies any fever.  She endorses facial pain around the maxillary sinus.    COPD: Still smoking 1 to 1/2 pk a day.  She would like a refill on nicotine patches to try to help her quit.  Having to use albuterol inhaler several times a day.  Using Incruse inhaler as prescribed but out of Breo for 1 month.  HTN/CAD:  Took meds already for today.  Limits salt in foods.  Thinks she has a monitoring device but has to find it.  Denies any chest pains or headaches.  No lower extremity edema compliant with ASA/Brinlinta and atorvastatin  Bipolar:  followed by behavioral health  Hypothyroid: Compliant with levothyroxine. Patient Active Problem List   Diagnosis Date Noted  . Bipolar I disorder ([place]) 09/17/2018  . Insomnia 10/28/2017  . COPD with acute exacerbation ([place]) 09/15/2017  . Pre-diabetes 05/28/2017  . Emesis, persistent 05/28/2017  . QT prolongation 03/25/2017  . Encounter for screening mammogram for breast cancer 09/11/2015  . Psoriasis  06/22/2015  . COPD (chronic obstructive pulmonary disease) ([place]) 05/18/2015  . Anxiety and depression 07/10/2013  . Essential hypertension, benign 06/07/2013  . Dyslipidemia 06/07/2013  . Asthma, chronic 06/07/2013  . Emphysema lung ([place]) 06/07/2013  . Chronic back pain 06/07/2013  . CAD S/P percutaneous coronary angioplasty 06/07/2013  . GERD (gastroesophageal reflux disease) 06/07/2013  . Breast lump on left side at 3 o'clock position 10/13/2012  . S/P abdominal hysterectomy and right salpingo-oophorectomy 08/29/2011  . COPD exacerbation ([place]) 09/15/2009  . TOBACCO ABUSE 07/17/2009  . Chronic rhinitis 07/17/2009  . Lung nodule < 6cm on CT 07/17/2009  . ALLERGY, FOOD 07/17/2009  . Hypothyroidism 12/22/2008     Current Outpatient Medications on File Prior to Visit  Medication Sig Dispense Refill  . albuterol (PROVENTIL HFA;VENTOLIN HFA) 108 (90 Base) MCG/ACT inhaler INHALE 2 PUFFS INTO THE LUNGS EVERY 6 (SIX) HOURS AS NEEDED FOR WHEEZING OR SHORTNESS OF BREATH. 18 g 0  . aspirin EC 81 MG tablet Take 81 mg by mouth daily.    Marland Kitchen atorvastatin (LIPITOR) 40 MG tablet Take 1 tablet (40 mg total) by mouth at bedtime. 90 tablet 2  . Blood Glucose Monitoring Suppl (TRUE METRIX METER) DEVI 1 kit by Does not apply route 4 (four) times daily. 1 Device 0  . BRILINTA 90 MG TABS tablet TAKE 1 TABLET BY MOUTH 2 (TWO) TIMES DAILY. 60 tablet 6  . busPIRone (BUSPAR) 15 MG tablet Take 1 tablet (15 mg total)  by mouth 3 (three) times daily. 270 tablet 1  . Calcium 600 MG tablet Take 600 mg by mouth daily.  60 tablet   . cyclobenzaprine (FLEXERIL) 10 MG tablet Take 1 tablet (10 mg total) by mouth 3 (three) times daily as needed. 30 tablet 0  . escitalopram (LEXAPRO) 20 MG tablet Take 1 tablet (20 mg total) by mouth daily. 30 tablet 3  . fluticasone (FLONASE) 50 MCG/ACT nasal spray Place 2 sprays into both nostrils daily. 16 g 6  . furosemide (LASIX) 20 MG tablet TAKE 1 TABLET BY MOUTH DAILY. 30 tablet 2  .  glucose blood (TRUE METRIX BLOOD GLUCOSE TEST) test strip Use as instructed 100 each 12  . ipratropium-albuterol (DUONEB) 0.5-2.5 (3) MG/3ML SOLN Take 3 mLs every 6 (six) hours as needed by nebulization. (Patient taking differently: Take 3 mLs by nebulization every 6 (six) hours as needed (wheezing). ) 360 mL 3  . labetalol (NORMODYNE) 100 MG tablet Take 1 tablet (100 mg total) by mouth 2 (two) times daily. 60 tablet 6  . levothyroxine (SYNTHROID, LEVOTHROID) 75 MCG tablet Take 1 tablet (75 mcg total) by mouth daily. 30 tablet 0  . loratadine (CLARITIN) 10 MG tablet Take 1 tablet (10 mg total) by mouth daily. 30 tablet 11  . losartan (COZAAR) 50 MG tablet TAKE 1 AND 1/2 TABLETS BY MOUTH DAILY. 45 tablet 2  . mirtazapine (REMERON) 30 MG tablet Take 0.5 tablets (15 mg total) by mouth at bedtime. 90 tablet 1  . montelukast (SINGULAIR) 10 MG tablet TAKE 1 TABLET BY MOUTH AT BEDTIME. 30 tablet 2  . ondansetron (ZOFRAN) 4 MG tablet Take 1 tablet (4 mg total) every 8 (eight) hours as needed by mouth for nausea or vomiting. 20 tablet 0  . ranitidine (ZANTAC) 150 MG tablet Take 1 tablet (150 mg total) by mouth 2 (two) times daily. 60 tablet 3  . triamcinolone cream (KENALOG) 0.1 % APPLY 1 APPLICATION TOPICALLY 2 TIMES DAILY. 30 g 1  . TRUEPLUS LANCETS 28G MISC 28 g by Does not apply route QID. 120 each 2  . umeclidinium bromide (INCRUSE ELLIPTA) 62.5 MCG/INH AEPB Inhale 1 puff into the lungs daily. 1 each 6   Current Facility-Administered Medications on File Prior to Visit  Medication Dose Route Frequency Provider Last Rate Last Dose  . doxepin (SINEQUAN) capsule 25 mg  25 mg Oral QHS Norma Fredrickson, MD        Allergies  Allergen Reactions  . Avocado Anaphylaxis  . Latex Shortness Of Breath and Rash  . Codeine Nausea Only    Social History   Socioeconomic History  . Marital status: Divorced    Spouse name: Not on file  . Number of children: Not on file  . Years of education: Not on file  .  Highest education level: Not on file  Occupational History  . Not on file  Social Needs  . Financial resource strain: Not hard at all  . Food insecurity:    Worry: Never true    Inability: Never true  . Transportation needs:    Medical: No    Non-medical: No  Tobacco Use  . Smoking status: Current Every Day Smoker    Packs/day: 0.50    Years: 49.00    Pack years: 24.50    Types: Cigarettes  . Smokeless tobacco: Never Used  Substance and Sexual Activity  . Alcohol use: Not Currently  . Drug use: No    Types: Cocaine    Comment:  used recently  . Sexual activity: Not Currently    Birth control/protection: Surgical  Lifestyle  . Physical activity:    Days per week: 7 days    Minutes per session: 10 min  . Stress: Only a little  Relationships  . Social connections:    Talks on phone: Not on file    Gets together: Not on file    Attends religious service: Not on file    Active member of club or organization: Not on file    Attends meetings of clubs or organizations: Not on file    Relationship status: Not on file  . Intimate partner violence:    Fear of current or ex partner: Not on file    Emotionally abused: Not on file    Physically abused: Not on file    Forced sexual activity: Not on file  Other Topics Concern  . Not on file  Social History Narrative  . Not on file    Family History  Problem Relation Age of Onset  . Heart disease Mother   . Hypertension Mother   . Stroke Mother   . Mental illness Mother   . Heart disease Father   . Hypertension Father   . Diabetes Father   . Cancer Maternal Aunt        breast  . Thyroid disease Paternal Aunt   . Heart disease Maternal Grandfather   . Anesthesia problems Daughter   . Cancer Paternal Grandmother        mouth  . Hypotension Neg Hx   . Malignant hyperthermia Neg Hx   . Pseudochol deficiency Neg Hx     Past Surgical History:  Procedure Laterality Date  . ABDOMINAL EXPLORATION SURGERY  1977  .  ABDOMINAL HYSTERECTOMY  1977   "left one of my ovaries"  . BACK SURGERY    . COLONOSCOPY    . CORONARY ANGIOPLASTY WITH STENT PLACEMENT  2011 X2   "regular stents didn't work; had to go back in in ~ 1 month and put in medicated stents"  . DILATION AND CURETTAGE OF UTERUS    . ESOPHAGOGASTRODUODENOSCOPY    . LEFT HEART CATH AND CORONARY ANGIOGRAPHY N/A 03/26/2017   Procedure: LEFT HEART CATH AND CORONARY ANGIOGRAPHY;  Surgeon: Belva Crome, MD;  Location: [facility];  Service: Cardiovascular;  Laterality: N/A;  . LUMBAR LAMINECTOMY/DECOMPRESSION MICRODISCECTOMY  08/05/2011   Procedure: LUMBAR LAMINECTOMY/DECOMPRESSION MICRODISCECTOMY;  Surgeon: Otilio Connors, MD;  Location: [facility];  Service: Neurosurgery;  Laterality: Right;  Right Lumbar four-five extraforaminal discectomy  . TONSILLECTOMY     as a child    ROS: Review of Systems Negative except as stated above  PHYSICAL EXAM: BP 120/68   Pulse 76   Temp 98.5 F (36.9 C) (Oral)   Resp 16   Wt 133 lb (60.3 kg)   SpO2 99%   BMI 24.72 kg/m   Wt Readings from Last 3 Encounters:  09/17/18 133 lb (60.3 kg)  07/23/18 126 lb (57.2 kg)  06/19/18 126 lb 9.6 oz (57.4 kg)   Physical Exam  General appearance - alert, well appearing, and in no distress Mental status - normal mood, behavior, speech, dress, motor activity, and thought processes Ears - bilateral TM's and external ear canals normal.  No preauricular lymphadenopathy Nose -moderate enlargement of the nasal turbinates with thick yellow mucus noted in the right nostrils Mouth - mucous membranes moist, pharynx normal without lesions Neck - supple, no significant adenopathy Chest -  breath sounds mildly decreased bilaterally with scattered wheezes Heart - normal rate, regular rhythm, normal S1, S2, no murmurs, rubs, clicks or gallops Extremities - peripheral pulses normal, no pedal edema, no clubbing or cyanosis   CMP Latest Ref Rng & Units 09/18/2017 09/16/2017  09/15/2017  Glucose 65 - 99 mg/dL 140(H) 140(H) 121(H)  BUN 8 - 27 mg/dL '23 18 15  ' Creatinine 0.57 - 1.00 mg/dL 0.84 0.83 0.82  Sodium 134 - 144 mmol/L 139 140 138  Potassium 3.5 - 5.2 mmol/L 4.6 4.2 4.7  Chloride 96 - 106 mmol/L 97 100(L) 99(L)  CO2 20 - 29 mmol/L '27 29 26  ' Calcium 8.7 - 10.3 mg/dL 9.4 9.1 9.1  Total Protein 6.0 - 8.5 g/dL 6.7 - 6.3(L)  Total Bilirubin 0.0 - 1.2 mg/dL 0.2 - 0.8  Alkaline Phos 39 - 117 IU/L 86 - 83  AST 0 - 40 IU/L 22 - 39  ALT 0 - 32 IU/L 27 - 28   Lipid Panel     Component Value Date/Time   CHOL 208 (H) 04/16/2016 0934   TRIG 74 04/16/2016 0934   HDL 55 04/16/2016 0934   CHOLHDL 3.8 04/16/2016 0934   VLDL 15 04/16/2016 0934   LDLCALC 138 (H) 04/16/2016 0934    CBC    Component Value Date/Time   WBC 13.4 (H) 09/18/2017 1513   WBC 10.6 (H) 09/15/2017 1553   RBC 4.83 09/18/2017 1513   RBC 4.70 09/15/2017 1553   HGB 14.4 09/18/2017 1513   HCT 43.2 09/18/2017 1513   PLT 450 (H) 09/18/2017 1513   MCV 89 09/18/2017 1513   MCH 29.8 09/18/2017 1513   MCH 29.8 09/15/2017 1553   MCHC 33.3 09/18/2017 1513   MCHC 33.3 09/15/2017 1553   RDW 14.7 09/18/2017 1513   LYMPHSABS 1.1 09/15/2017 1553   LYMPHSABS 0.7 10/31/2016 1605   MONOABS 1.7 (H) 09/15/2017 1553   EOSABS 0.0 09/15/2017 1553   EOSABS 0.0 10/31/2016 1605   BASOSABS 0.0 09/15/2017 1553   BASOSABS 0.0 10/31/2016 1605    ASSESSMENT AND PLAN: 1. Acute recurrent maxillary sinusitis Recommend using Nettie pot about 2-3 times a week. - Sodium Chloride-Sodium Bicarb (NETI POT SINUS WASH) 2300-700 MG KIT; Do nasal rinse three times a week as needed  Dispense: 1 each; Refill: 0 - doxycycline (VIBRA-TABS) 100 MG tablet; Take 1 tablet (100 mg total) by mouth 2 (two) times daily.  Dispense: 20 tablet; Refill: 0  2. COPD with chronic bronchitis ([place]) Refill given on Breo.  Continue Incruse.  Strongly encourage smoking cessation - fluticasone furoate-vilanterol (BREO ELLIPTA) 200-25 MCG/INH  AEPB; Inhale 1 puff into the lungs daily.  Dispense: 60 each; Refill: 6  3. TOBACCO ABUSE Patient advised to quit smoking. Discussed health risks associated with smoking including lung and other types of cancers, chronic lung diseases and CV risks.. Pt ready to give trail of quitting.  Discussed methods to help quit including quitting cold [place], use of NRT, Chantix and Bupropion.  - nicotine (NICODERM CQ - DOSED IN MG/24 HOURS) 21 mg/24hr patch; Place 1 patch (21 mg total) onto the skin daily.  Dispense: 28 patch; Refill: 1  4. Essential hypertension At goal.  Continue current medications - CBC - Comprehensive metabolic panel  5. Gastroesophageal reflux disease without esophagitis - omeprazole (PRILOSEC) 40 MG capsule; Take 1 capsule (40 mg total) by mouth daily.  Dispense: 30 capsule; Refill: 4  6. Bipolar I disorder ([place]) Followed by behavioral health  7. Acquired hypothyroidism Continue  levothyroxine - TSH  8. Coronary artery disease involving native coronary artery of native heart without angina pectoris Clinically stable.  Continue aspirin, Brilinta, Lipitor - Lipid panel    Patient was given the opportunity to ask questions.  Patient verbalized understanding of the plan and was able to repeat key elements of the plan.   Orders Placed This Encounter  Procedures  . CBC  . Comprehensive metabolic panel  . Lipid panel  . TSH     Requested Prescriptions   Signed Prescriptions Disp Refills  . fluticasone furoate-vilanterol (BREO ELLIPTA) 200-25 MCG/INH AEPB 60 each 6    Sig: Inhale 1 puff into the lungs daily.  Marland Kitchen gabapentin (NEURONTIN) 600 MG tablet 120 tablet 3    Sig: TAKE 1 TABLET BY MOUTH 4 TIMES DAILY  . nicotine (NICODERM CQ - DOSED IN MG/24 HOURS) 21 mg/24hr patch 28 patch 1    Sig: Place 1 patch (21 mg total) onto the skin daily.  . Sodium Chloride-Sodium Bicarb (NETI POT SINUS WASH) 2300-700 MG KIT 1 each 0    Sig: Do nasal rinse three times a week as  needed  . doxycycline (VIBRA-TABS) 100 MG tablet 20 tablet 0    Sig: Take 1 tablet (100 mg total) by mouth 2 (two) times daily.  Marland Kitchen omeprazole (PRILOSEC) 40 MG capsule 30 capsule 4    Sig: Take 1 capsule (40 mg total) by mouth daily.    Return in about 3 months (around 12/18/2018).  Karle Plumber, MD, FACP

## 2018-09-17 NOTE — Progress Notes (Signed)
Pt states she has a knot in her ear

## 2018-09-17 NOTE — Patient Instructions (Signed)
Use the Nettie pot 2-3 times a week as discussed.

## 2018-09-18 LAB — COMPREHENSIVE METABOLIC PANEL
ALT: 18 IU/L (ref 0–32)
AST: 17 IU/L (ref 0–40)
Albumin/Globulin Ratio: 2.4 — ABNORMAL HIGH (ref 1.2–2.2)
Albumin: 4.7 g/dL (ref 3.8–4.8)
Alkaline Phosphatase: 91 IU/L (ref 39–117)
BUN/Creatinine Ratio: 14 (ref 12–28)
BUN: 12 mg/dL (ref 8–27)
Bilirubin Total: <0.2 mg/dL (ref 0.0–1.2)
CO2: 23 mmol/L (ref 20–29)
Calcium: 9.8 mg/dL (ref 8.7–10.3)
Chloride: 100 mmol/L (ref 96–106)
Creatinine, Ser: 0.84 mg/dL (ref 0.57–1.00)
GFR, EST AFRICAN AMERICAN: 86 mL/min/{1.73_m2} (ref >59)
GFR, EST NON AFRICAN AMERICAN: 75 mL/min/{1.73_m2} (ref >59)
Globulin, Total: 2 g/dL (ref 1.5–4.5)
Glucose: 72 mg/dL (ref 65–99)
Potassium: 4.6 mmol/L (ref 3.5–5.2)
Sodium: 140 mmol/L (ref 134–144)
Total Protein: 6.7 g/dL (ref 6.0–8.5)

## 2018-09-18 LAB — LIPID PANEL
CHOL/HDL RATIO: 2.2 ratio (ref 0.0–4.4)
Cholesterol, Total: 164 mg/dL (ref 100–199)
HDL: 75 mg/dL (ref >39)
LDL Calculated: 68 mg/dL (ref 0–99)
Triglycerides: 106 mg/dL (ref 0–149)
VLDL Cholesterol Cal: 21 mg/dL (ref 5–40)

## 2018-09-18 LAB — CBC
HEMOGLOBIN: 13.5 g/dL (ref 11.1–15.9)
Hematocrit: 41.5 % (ref 34.0–46.6)
MCH: 29.4 pg (ref 26.6–33.0)
MCHC: 32.5 g/dL (ref 31.5–35.7)
MCV: 90 fL (ref 79–97)
Platelets: 499 10*3/uL — ABNORMAL HIGH (ref 150–450)
RBC: 4.59 x10E6/uL (ref 3.77–5.28)
RDW: 15.5 % — ABNORMAL HIGH (ref 11.7–15.4)
WBC: 6.9 10*3/uL (ref 3.4–10.8)

## 2018-09-18 LAB — TSH: TSH: 12.13 u[IU]/mL — AB (ref 0.450–4.500)

## 2018-09-25 ENCOUNTER — Telehealth: Payer: Self-pay | Admitting: Internal Medicine

## 2018-09-25 ENCOUNTER — Telehealth: Payer: Self-pay

## 2018-09-25 DIAGNOSIS — E039 Hypothyroidism, unspecified: Secondary | ICD-10-CM

## 2018-09-25 DIAGNOSIS — I10 Essential (primary) hypertension: Secondary | ICD-10-CM

## 2018-09-25 MED ORDER — LEVOTHYROXINE SODIUM 100 MCG PO TABS: 100.0000 ug | ORAL_TABLET | Freq: Every day | ORAL | 3 refills | Status: DC

## 2018-09-25 MED FILL — LOSARTAN POTASSIUM 50 MG TA: 50 | 30 days supply | Qty: 45 | Fill #2

## 2018-09-25 MED FILL — BRILINTA 90 MG TABLET: 90 | 30 days supply | Qty: 60 | Fill #1

## 2018-09-25 MED FILL — ?ATORVASTATIN 40MG TABLET: 40 | 90 days supply | Qty: 90 | Fill #1

## 2018-09-25 MED FILL — ESCITALOPRAM 20 MG TABLET: 20 | 30 days supply | Qty: 30 | Fill #3

## 2018-09-25 MED FILL — LEVOTHYROXINE 100 MCG TAB: 100 | 30 days supply | Qty: 30 | Fill #0

## 2018-09-25 NOTE — Telephone Encounter (Signed)
Contacted pt to go over lab results pt is aware  Dr. Wynetta Emery pt states she has been taking the Levothyroxine 75 every day. Informed pt that you will be increasing her dosage and would like her to come in for a 6 week lab visit to check levels to make sure the new dosage is working.     Dr. Wynetta Emery I have sent the rx in for you pt states she is coming around lunch time today to pick up rx's .

## 2018-09-28 ENCOUNTER — Telehealth: Payer: Self-pay | Admitting: Internal Medicine

## 2018-09-28 NOTE — Telephone Encounter (Signed)
Patients call returned.  Patient identified by name and date of birth. Patient states she needed certain medications refilled.  Patient had arrived to clinic complaining of "seeing spots."  Patient left clinic prior to being able to be seen and evaluated by Nurse.    Patient then called and asked Nurse for refills for certain medications.  Nursing stated that Dr. Wynetta Emery had put in for refills.  Patient given the pharmacy number so she could call prior to coming to make sure prescriptions were ready.    Patient was asked about the spots she was seeing.  Patient stated she was not seeing them anymore.  Patient denied chest pain.  Patient stated her shortness of breath had not increase beyond her baseline.  Patient was told that if his condition worsened and/or change for the worse to go to the Emergency Department or Urgent care.

## 2018-09-28 NOTE — Telephone Encounter (Signed)
Patient did not respond from the lobby.  Called twice.

## 2018-09-28 NOTE — Telephone Encounter (Signed)
Refills ordered, will forward this onto PCP due to patient reporting 'seeing dots'

## 2018-09-28 NOTE — Telephone Encounter (Signed)
Patient came in to check on the status of her medications states she does not have anymore BP medicine and is seeing dots. Please follow up

## 2018-09-28 NOTE — Telephone Encounter (Signed)
See above note

## 2018-09-28 NOTE — Telephone Encounter (Signed)
Patient came in requesting/checking on her medication refill request. Patient was advised another request would be sent over. Patient states they do not have anymore bp medicine and mentioned they see dots. Patient is not in the lobby and advised triage nurse

## 2018-09-29 ENCOUNTER — Other Ambulatory Visit: Payer: Self-pay

## 2018-09-29 ENCOUNTER — Ambulatory Visit (HOSPITAL_COMMUNITY): Payer: Medicaid Other | Admitting: Licensed Clinical Social Worker

## 2018-09-29 ENCOUNTER — Telehealth: Payer: Self-pay | Admitting: Internal Medicine

## 2018-09-29 DIAGNOSIS — J302 Other seasonal allergic rhinitis: Secondary | ICD-10-CM

## 2018-09-29 MED ORDER — UMECLIDINIUM BROMIDE 62.5 MCG/INH IN AEPB: 1.0000 | INHALATION_SPRAY | Freq: Every day | RESPIRATORY_TRACT | 6 refills | Status: DC

## 2018-09-29 MED ORDER — FLUTICASONE PROPIONATE 50 MCG/ACT NA SUSP: 2.0000 | Freq: Every day | NASAL | 6 refills | Status: DC

## 2018-09-29 MED FILL — FUROSEMIDE 20 MG TABLET: 20 | 30 days supply | Qty: 30 | Fill #0

## 2018-09-29 MED FILL — CYCLOBENZAPRINE 10 MG TAB: 10 | 10 days supply | Qty: 30 | Fill #0

## 2018-09-29 MED FILL — LABETALOL HCL 100 MG TABS: 100 | 30 days supply | Qty: 60 | Fill #0

## 2018-09-29 MED FILL — $VENTOLIN HFA 18G INHALER: 108 (90 BAS | 75 days supply | Qty: 54 | Fill #0

## 2018-09-29 MED FILL — MONTELUKAST SOD 10 MG TAB: 10 | 30 days supply | Qty: 30 | Fill #0

## 2018-09-29 NOTE — Telephone Encounter (Signed)
New Message   Pt's son came in to get meds and was told the pt needed an appt but pt is quarantined at her nursing facility

## 2018-09-29 NOTE — Telephone Encounter (Signed)
Contacted pt to see what medications she is needing. Pt states she is needing her breo, incruse and flonase refilled. Informed pt that I sent rx to [facility]

## 2018-10-08 ENCOUNTER — Telehealth (HOSPITAL_COMMUNITY): Payer: Medicaid Other | Admitting: Psychiatry

## 2018-10-08 ENCOUNTER — Ambulatory Visit (HOSPITAL_COMMUNITY): Payer: Medicaid Other | Admitting: Psychiatry

## 2018-10-08 ENCOUNTER — Other Ambulatory Visit: Payer: Self-pay

## 2018-10-13 ENCOUNTER — Other Ambulatory Visit: Payer: Self-pay

## 2018-10-13 ENCOUNTER — Ambulatory Visit (HOSPITAL_COMMUNITY): Payer: Self-pay | Admitting: Licensed Clinical Social Worker

## 2018-10-16 ENCOUNTER — Other Ambulatory Visit: Payer: Self-pay

## 2018-10-16 ENCOUNTER — Ambulatory Visit (INDEPENDENT_AMBULATORY_CARE_PROVIDER_SITE_OTHER): Payer: Medicaid Other | Admitting: Psychiatry

## 2018-10-16 DIAGNOSIS — F411 Generalized anxiety disorder: Secondary | ICD-10-CM

## 2018-10-16 DIAGNOSIS — F419 Anxiety disorder, unspecified: Secondary | ICD-10-CM

## 2018-10-16 DIAGNOSIS — F17210 Nicotine dependence, cigarettes, uncomplicated: Secondary | ICD-10-CM

## 2018-10-16 DIAGNOSIS — Z79899 Other long term (current) drug therapy: Secondary | ICD-10-CM

## 2018-10-16 DIAGNOSIS — F32A Depression, unspecified: Secondary | ICD-10-CM

## 2018-10-16 DIAGNOSIS — F329 Major depressive disorder, single episode, unspecified: Secondary | ICD-10-CM

## 2018-10-16 MED ORDER — BUSPIRONE HCL 15 MG PO TABS: 15.0000 mg | ORAL_TABLET | Freq: Three times a day (TID) | ORAL | 1 refills | Status: DC

## 2018-10-16 MED ORDER — ESCITALOPRAM OXALATE 20 MG PO TABS: 20.0000 mg | ORAL_TABLET | Freq: Every day | ORAL | 4 refills | Status: DC

## 2018-10-16 MED ORDER — DOXEPIN HCL 50 MG PO CAPS: 50.0000 mg | ORAL_CAPSULE | Freq: Every day | ORAL | 2 refills | Status: DC

## 2018-10-16 MED ORDER — MIRTAZAPINE 30 MG PO TABS: 15.0000 mg | ORAL_TABLET | Freq: Every day | ORAL | 1 refills | Status: DC

## 2018-10-16 NOTE — Progress Notes (Signed)
[facility] MD/PA/NP OP Progress Note  10/16/2018 10:12 AM [name] [name]  MRN:  [id number]  Chief Complaint: Anxiety HPI:  Today the patient is at her baseline.  For some reason she was not seen at her next scheduled appointment which was supposed to be 3 months after her first visit.  This is only her second visit.  The patient is doing actually quite well.  She is moved to a new place.  She lives in the [place] state.  Her daughter is doing well HER-2 grandchildren who are actually adults are doing well.  The patient's anxiety is relatively controlled.  She has erratic sleep.  We have chosen not to treated.  She does not take naps.  She uses no chronic crack cocaine for many years.  She is clean.  BuSpar 15 mg 3 times daily works well.  The patient is sleeping only fair but it is her baseline.  Her appetite is good.  She has good energy.  Wrote a letter for her to help her bring her dog to her new apartment which is working.  She enjoys her dog a great deal.  She reads she watches TV.  The patient has just stopped smoking cigarettes recently.  Generally her health is stable.  She has hypertension that is well controlled.  She takes all her medicines as prescribed.  She sees her primary care doctor regularly.  She denies daily depression or anxiety.  She is not in therapy.  Visit Diagnosis:    ICD-10-CM   1. Depression, unspecified depression type F32.9 mirtazapine (REMERON) 30 MG tablet  2. GAD (generalized anxiety disorder) F41.1 mirtazapine (REMERON) 30 MG tablet    busPIRone (BUSPAR) 15 MG tablet  3. Anxiety and depression F41.9 escitalopram (LEXAPRO) 20 MG tablet   F32.9     Past Psychiatric History: history of substance use, crack cocaine addiction, alcohol use disorder  Past Medical History:  Past Medical History:  Diagnosis Date  . Anxiety    takes Lexapro daily  . Arthritis    "back, from neck down pass my bra area" (03/25/2017)  . Asthma   . Bartholin gland cyst 08/29/2011  .  Bruises easily    pt is on Effient  . Chronic back pain    herniated nucleus pulposus  . Chronic back pain    "neck to bra area; lower back" (03/25/2017)  . COPD (chronic obstructive pulmonary disease) ([place])    early stages  . Coronary artery disease   . Depression    takes Klonopin daily  . Diabetes mellitus without complication ([place])   . Diverticulosis   . GERD (gastroesophageal reflux disease)    takes Nexium daily  . H/O hiatal hernia   . Heart attack ([place]) 2011  . Hemorrhoids   . Hernia   . Hyperlipidemia    takes Lipitor daily  . Hypertension    takes Losartan daily and Labetalol bid  . Hypothyroidism    takes Synthroid daily  . Insomnia    hydroxyzine prn  . Joint pain   . Pneumonia    "couple times" (03/25/2017)  . Pre-diabetes    "just found out 1 wk ago" (03/25/2017)  . Psoriasis    elbows,knees,back  . Shortness of breath    with exertion  . Slowing of urinary stream   . Stress incontinence     Past Surgical History:  Procedure Laterality Date  . ABDOMINAL EXPLORATION SURGERY  1977  . ABDOMINAL HYSTERECTOMY  1977   "  left one of my ovaries"  . BACK SURGERY    . COLONOSCOPY    . CORONARY ANGIOPLASTY WITH STENT PLACEMENT  2011 X2   "regular stents didn't work; had to go back in in ~ 1 month and put in medicated stents"  . DILATION AND CURETTAGE OF UTERUS    . ESOPHAGOGASTRODUODENOSCOPY    . LEFT HEART CATH AND CORONARY ANGIOGRAPHY N/A 03/26/2017   Procedure: LEFT HEART CATH AND CORONARY ANGIOGRAPHY;  Surgeon: Belva Crome, MD;  Location: [facility];  Service: Cardiovascular;  Laterality: N/A;  . LUMBAR LAMINECTOMY/DECOMPRESSION MICRODISCECTOMY  08/05/2011   Procedure: LUMBAR LAMINECTOMY/DECOMPRESSION MICRODISCECTOMY;  Surgeon: Otilio Connors, MD;  Location: [facility];  Service: Neurosurgery;  Laterality: Right;  Right Lumbar four-five extraforaminal discectomy  . TONSILLECTOMY     as a child    Family Psychiatric History: See intake H&P for  full details. Reviewed, with no updates at this time.   Family History:  Family History  Problem Relation Age of Onset  . Heart disease Mother   . Hypertension Mother   . Stroke Mother   . Mental illness Mother   . Heart disease Father   . Hypertension Father   . Diabetes Father   . Cancer Maternal Aunt        breast  . Thyroid disease Paternal Aunt   . Heart disease Maternal Grandfather   . Anesthesia problems Daughter   . Cancer Paternal Grandmother        mouth  . Hypotension Neg Hx   . Malignant hyperthermia Neg Hx   . Pseudochol deficiency Neg Hx     Social History:  Social History   Socioeconomic History  . Marital status: Divorced    Spouse name: Not on file  . Number of children: Not on file  . Years of education: Not on file  . Highest education level: Not on file  Occupational History  . Not on file  Social Needs  . Financial resource strain: Not hard at all  . Food insecurity:    Worry: Never true    Inability: Never true  . Transportation needs:    Medical: No    Non-medical: No  Tobacco Use  . Smoking status: Current Every Day Smoker    Packs/day: 0.50    Years: 49.00    Pack years: 24.50    Types: Cigarettes  . Smokeless tobacco: Never Used  Substance and Sexual Activity  . Alcohol use: Not Currently  . Drug use: No    Types: Cocaine    Comment: used recently  . Sexual activity: Not Currently    Birth control/protection: Surgical  Lifestyle  . Physical activity:    Days per week: 7 days    Minutes per session: 10 min  . Stress: Only a little  Relationships  . Social connections:    Talks on phone: Not on file    Gets together: Not on file    Attends religious service: Not on file    Active member of club or organization: Not on file    Attends meetings of clubs or organizations: Not on file    Relationship status: Not on file  Other Topics Concern  . Not on file  Social History Narrative  . Not on file    Allergies:   Allergies  Allergen Reactions  . Avocado Anaphylaxis  . Latex Shortness Of Breath and Rash  . Codeine Nausea Only    Metabolic Disorder Labs: Lab  Results  Component Value Date   HGBA1C 5.9 05/12/2018   No results found for: PROLACTIN Lab Results  Component Value Date   CHOL 164 09/17/2018   TRIG 106 09/17/2018   HDL 75 09/17/2018   CHOLHDL 2.2 09/17/2018   VLDL 15 04/16/2016   LDLCALC 68 09/17/2018   LDLCALC 138 (H) 04/16/2016   Lab Results  Component Value Date   TSH 12.130 (H) 09/17/2018   TSH 0.317 (L) 09/18/2017    Therapeutic Level Labs: No results found for: LITHIUM No results found for: VALPROATE No components found for:  CBMZ  Current Medications: Current Outpatient Medications  Medication Sig Dispense Refill  . albuterol (VENTOLIN HFA) 108 (90 Base) MCG/ACT inhaler INHALE 2 PUFFS INTO THE LUNGS EVERY 6 (SIX) HOURS AS NEEDED FOR WHEEZING OR SHORTNESS OF BREATH. 18 g 2  . aspirin EC 81 MG tablet Take 81 mg by mouth daily.    Marland Kitchen atorvastatin (LIPITOR) 40 MG tablet Take 1 tablet (40 mg total) by mouth at bedtime. 90 tablet 2  . Blood Glucose Monitoring Suppl (TRUE METRIX METER) DEVI 1 kit by Does not apply route 4 (four) times daily. 1 Device 0  . BRILINTA 90 MG TABS tablet TAKE 1 TABLET BY MOUTH 2 (TWO) TIMES DAILY. 60 tablet 6  . busPIRone (BUSPAR) 15 MG tablet Take 1 tablet (15 mg total) by mouth 3 (three) times daily. 270 tablet 1  . Calcium 600 MG tablet Take 600 mg by mouth daily.  60 tablet   . cyclobenzaprine (FLEXERIL) 10 MG tablet TAKE 1 TABLET BY MOUTH 3 TIMES DAILY AS NEEDED. 30 tablet 0  . doxepin (SINEQUAN) 50 MG capsule Take 1 capsule (50 mg total) by mouth at bedtime. 30 capsule 2  . doxycycline (VIBRA-TABS) 100 MG tablet Take 1 tablet (100 mg total) by mouth 2 (two) times daily. 20 tablet 0  . escitalopram (LEXAPRO) 20 MG tablet Take 1 tablet (20 mg total) by mouth daily. 30 tablet 4  . fluticasone (FLONASE) 50 MCG/ACT nasal spray Place 2 sprays  into both nostrils daily. 16 g 6  . fluticasone furoate-vilanterol (BREO ELLIPTA) 200-25 MCG/INH AEPB Inhale 1 puff into the lungs daily. 60 each 6  . furosemide (LASIX) 20 MG tablet TAKE 1 TABLET BY MOUTH DAILY. 30 tablet 2  . gabapentin (NEURONTIN) 600 MG tablet TAKE 1 TABLET BY MOUTH 4 TIMES DAILY 120 tablet 3  . glucose blood (TRUE METRIX BLOOD GLUCOSE TEST) test strip Use as instructed 100 each 12  . ipratropium-albuterol (DUONEB) 0.5-2.5 (3) MG/3ML SOLN Take 3 mLs every 6 (six) hours as needed by nebulization. (Patient taking differently: Take 3 mLs by nebulization every 6 (six) hours as needed (wheezing). ) 360 mL 3  . labetalol (NORMODYNE) 100 MG tablet TAKE 1 TABLET BY MOUTH 2 (TWO) TIMES DAILY. 60 tablet 2  . levothyroxine (SYNTHROID, LEVOTHROID) 100 MCG tablet Take 1 tablet (100 mcg total) by mouth daily. 30 tablet 3  . loratadine (CLARITIN) 10 MG tablet Take 1 tablet (10 mg total) by mouth daily. 30 tablet 11  . losartan (COZAAR) 50 MG tablet TAKE 1 AND 1/2 TABLETS BY MOUTH DAILY. 45 tablet 2  . mirtazapine (REMERON) 30 MG tablet Take 0.5 tablets (15 mg total) by mouth at bedtime. 90 tablet 1  . montelukast (SINGULAIR) 10 MG tablet TAKE 1 TABLET BY MOUTH AT BEDTIME. 30 tablet 2  . nicotine (NICODERM CQ - DOSED IN MG/24 HOURS) 21 mg/24hr patch Place 1 patch (21 mg total) onto  the skin daily. 28 patch 1  . omeprazole (PRILOSEC) 40 MG capsule Take 1 capsule (40 mg total) by mouth daily. 30 capsule 4  . ondansetron (ZOFRAN) 4 MG tablet Take 1 tablet (4 mg total) every 8 (eight) hours as needed by mouth for nausea or vomiting. 20 tablet 0  . ranitidine (ZANTAC) 150 MG tablet Take 1 tablet (150 mg total) by mouth 2 (two) times daily. 60 tablet 3  . Sodium Chloride-Sodium Bicarb (NETI POT SINUS WASH) 2300-700 MG KIT Do nasal rinse three times a week as needed 1 each 0  . triamcinolone cream (KENALOG) 0.1 % APPLY 1 APPLICATION TOPICALLY 2 TIMES DAILY. 30 g 1  . TRUEPLUS LANCETS 28G MISC 28 g by  Does not apply route QID. 120 each 2  . umeclidinium bromide (INCRUSE ELLIPTA) 62.5 MCG/INH AEPB Inhale 1 puff into the lungs daily. 1 each 6   No current facility-administered medications for this visit.     Musculoskeletal: Strength & Muscle Tone: within normal limits Gait & Station: normal Patient leans: N/A  Psychiatric Specialty Exam: ROS  There were no vitals taken for this visit.There is no height or weight on file to calculate BMI.  General Appearance: Casual and Fairly Groomed  Eye Contact:  Fair  Speech:  Clear and Coherent and Normal Rate  Volume:  Normal  Mood:  Anxious and Depressed  Affect:  Appropriate and Congruent  Thought Process:  Goal Directed and Descriptions of Associations: Intact  Orientation:  Full (Time, Place, and Person)  Thought Content: Logical   Suicidal Thoughts:  No  Homicidal Thoughts:  No  Memory:  Immediate;   Good  Judgement:  Fair  Insight:  Fair  Psychomotor Activity:  Normal  Concentration:  Concentration: Fair  Recall:  [place] of Knowledge: Fair  Language: Fair  Akathisia:  Negative  Handed:  Right  AIMS (if indicated): not done  Assets:  Communication Skills Desire for Improvement Housing  ADL's:  Intact  Cognition: WNL  Sleep:  Poor   Screenings: AUDIT     Admission (Discharged) from 07/10/2013 in [facility]  Alcohol Use Disorder Identification Test Final Score (AUDIT)  12    GAD-7     Office Visit from 12/25/2017 in [facility] Counselor from 12/22/2017 in [facility] Office Visit from 09/18/2017 in [facility] Visit from 04/04/2017 in [facility] Visit from 03/13/2017 in [facility]  Total GAD-7 Score  '18  21  12  7  15    ' PHQ2-9     Office Visit from 12/25/2017 in [facility] Counselor from  12/22/2017 in [facility] Office Visit from 09/18/2017 in [facility] Visit from 05/28/2017 in [facility] Office Visit from 04/04/2017 in [facility]  PHQ-2 Total Score  '6  6  2  2  2  ' PHQ-9 Total Score  '24  23  8  9  7       ' Assessment and Plan:  At this time the patient's #1 problem seems to be an anxiety disorder.  Most likely it is generalized anxiety disorder and fortunately she does well taking BuSpar 15 mg 3 times daily and Lexapro 20 mg.  This is certainly excellent treatment for generalized anxiety disorder and  I think it is working.  Her second problem is that of insomnia.  We increased her Remeron to 30 mg which has not made much of a difference.  Today we could add doxepin 50 mg as well.  Overall the patient is functioning fairly well.  She is stable.  She is not suicidal.  1. Depression, unspecified depression type   2. GAD (generalized anxiety disorder)   3. Anxiety and depression     Status of current problems: new to [employer] Ordered: No orders of the defined types were placed in this encounter.   Labs Reviewed: na  Collateral Obtained/Records Reviewed: Reviewed notes from Dr. Adele Schilder  Plan:  Increase Lexapro to 20 mg Increase BuSpar to 15 mg 3 times daily Seroquel discontinued by patient given  restless legs and leg cramps Initiate Remeron 15 mg nightly Continue individual therapy and palliative/hospice grief counselor   Jerral Ralph, MD 10/16/2018, 10:12 AM

## 2018-10-21 ENCOUNTER — Encounter (HOSPITAL_COMMUNITY): Payer: Self-pay | Admitting: Licensed Clinical Social Worker

## 2018-10-21 ENCOUNTER — Other Ambulatory Visit: Payer: Self-pay

## 2018-10-21 ENCOUNTER — Ambulatory Visit (INDEPENDENT_AMBULATORY_CARE_PROVIDER_SITE_OTHER): Payer: Medicaid Other | Admitting: Licensed Clinical Social Worker

## 2018-10-21 DIAGNOSIS — F319 Bipolar disorder, unspecified: Secondary | ICD-10-CM

## 2018-10-21 DIAGNOSIS — F411 Generalized anxiety disorder: Secondary | ICD-10-CM

## 2018-10-21 NOTE — Progress Notes (Signed)
Virtual Visit via Phone Note  I connected with [name] [name] on 10/21/18 at  2:30 PM EDT by phone enabled telemedicine application and verified that I am speaking with the correct person using two identifiers.   I discussed the limitations of evaluation and management by telemedicine and the availability of in person appointments. The patient expressed understanding and agreed to proceed.  History of Present Illness: Pt was referred to OP therapy by Psychiatrist, Dr. Casimiro Needle, for therapy anxiety, depression and Bipolar disorder.     Observations/Objective: Pt presented anxious via phone telemedicine. "Im quarantined in my apt and can't leave for 14 days. She lives in an asissted liviing apt for elderly. She cannot go outside to smoke nor take her dog for a walk. "i'm going crazy not smoking. I am wearing the patches." Educated pt on smoking cessation.   Assessment and Plan: Continue to see pt by phone every 2 weeks. Pt does not have the Internet so I cannot send her smoking cessation materials. Discussed coping tools for his anxiety.   Follow Up Instructions:    I discussed the assessment and treatment plan with the patient. The patient was provided an opportunity to ask questions and all were answered. The patient agreed with the plan and demonstrated an understanding of the instructions.   The patient was advised to call back or seek an in-person evaluation if the symptoms worsen or if the condition fails to improve as anticipated.  I provided 25 minutes of non-face-to-face time during this encounter.   [name] [name], LCAS

## 2018-10-29 ENCOUNTER — Other Ambulatory Visit: Payer: Self-pay | Admitting: Internal Medicine

## 2018-10-29 DIAGNOSIS — F329 Major depressive disorder, single episode, unspecified: Secondary | ICD-10-CM

## 2018-10-29 DIAGNOSIS — F419 Anxiety disorder, unspecified: Secondary | ICD-10-CM

## 2018-10-29 DIAGNOSIS — I10 Essential (primary) hypertension: Secondary | ICD-10-CM

## 2018-10-29 MED FILL — ?LABETALOL HCL 100 MG TABS: 100 | 30 days supply | Qty: 60 | Fill #1

## 2018-10-29 MED FILL — GABAPENTIN 600 MG TABLET: 600 | 30 days supply | Qty: 120 | Fill #1

## 2018-10-29 MED FILL — BRILINTA 90 MG TABLET: 90 | 30 days supply | Qty: 60 | Fill #2

## 2018-10-29 MED FILL — NICOTINE 21 MG/24HR PATCH: 21 | 28 days supply | Qty: 28 | Fill #1

## 2018-10-29 MED FILL — $INCRUSE ELLIPTA 62.5 MCG I: 62.5 MCG | 90 days supply | Qty: 90 | Fill #0

## 2018-10-29 MED FILL — LEVOTHYROXINE 100 MCG TAB: 100 | 30 days supply | Qty: 30 | Fill #1

## 2018-10-29 MED FILL — ?FUROSEMIDE 20 MG TABLET: 20 | 30 days supply | Qty: 30 | Fill #1

## 2018-10-29 MED FILL — MONTELUKAST SOD 10 MG TAB: 10 | 30 days supply | Qty: 30 | Fill #1

## 2018-10-29 MED FILL — FLUTICASONE PROP 50 MCG SPR: 50 | 30 days supply | Qty: 16 | Fill #0

## 2018-10-29 MED FILL — OMEPRAZOLE DR 40 MG CAPSULE: 40 | 30 days supply | Qty: 30 | Fill #1

## 2018-10-30 MED FILL — ESCITALOPRAM 20 MG TABLET: 20 | 30 days supply | Qty: 30 | Fill #0

## 2018-10-30 MED FILL — CYCLOBENZAPRINE 10 MG TAB: 10 | 10 days supply | Qty: 30 | Fill #0

## 2018-10-30 MED FILL — LOSARTAN POTASSIUM 50 MG TA: 50 | 30 days supply | Qty: 45 | Fill #0

## 2018-11-04 ENCOUNTER — Other Ambulatory Visit: Payer: Self-pay

## 2018-11-04 ENCOUNTER — Ambulatory Visit (INDEPENDENT_AMBULATORY_CARE_PROVIDER_SITE_OTHER): Payer: Medicaid Other | Admitting: Licensed Clinical Social Worker

## 2018-11-04 ENCOUNTER — Encounter (HOSPITAL_COMMUNITY): Payer: Self-pay | Admitting: Licensed Clinical Social Worker

## 2018-11-04 DIAGNOSIS — F32A Depression, unspecified: Secondary | ICD-10-CM

## 2018-11-04 DIAGNOSIS — F329 Major depressive disorder, single episode, unspecified: Secondary | ICD-10-CM

## 2018-11-04 DIAGNOSIS — F411 Generalized anxiety disorder: Secondary | ICD-10-CM

## 2018-11-04 NOTE — Progress Notes (Signed)
Virtual Visit via Phone Note  I connected with [name] [name] on 11/04/18 at  2:30 PM EDT by phone enabled telemedicine application and verified that I am speaking with the correct person using two identifiers.   I discussed the limitations of evaluation and management by telemedicine and the availability of in person appointments. The patient expressed understanding and agreed to proceed.  History of Present Illness: Pt was referred to OP therapy by Psychiatrist, Dr. Casimiro Needle, for therapy anxiety, depression and Bipolar disorder.     Observations/Objective: Pt presented depressed via phone telemedicine. Pt discussed her psychiatric symptoms and current life events. Pt reports she thinks she's depressed because she is bound to her living situations, home for older adults and they cannot leave the premises. " I can go outside and smoke." Questioned: Returned to smoking. At last session pt was not allowed out of her apt, quarantined 14 days, even to smoke. She was using the patch. "I needed to go back smoking because the pandemic is difficult to deal with." Asked open ended questions and processed the fears surrounding the pandemic. Pt reports she is taking her psychiatric meds as prescribed. Processed with pt what she wants for her future. Discussed hope with pt.  Reviewed tx plan with pt. Made future phone appts.       Assessment and Plan: Continue to provide therapy by phone every 2 weeks.   Follow Up Instructions:    I discussed the assessment and treatment plan with the patient. The patient was provided an opportunity to ask questions and all were answered. The patient agreed with the plan and demonstrated an understanding of the instructions.   The patient was advised to call back or seek an in-person evaluation if the symptoms worsen or if the condition fails to improve as anticipated.  I provided 25 minutes of non-face-to-face time during this encounter.   [name] [name] S,  LCAS

## 2018-11-13 ENCOUNTER — Telehealth: Payer: Self-pay | Admitting: Internal Medicine

## 2018-11-13 NOTE — Telephone Encounter (Signed)
Please schedule

## 2018-11-13 NOTE — Telephone Encounter (Signed)
New message  Pt is calling, requesting som antibiotic and prednisone she states she spoke with a nurse

## 2018-11-13 NOTE — Telephone Encounter (Signed)
I don't see a note where pt called or spoke with a nurse  Will forward to pcp

## 2018-11-18 ENCOUNTER — Encounter: Payer: Self-pay | Admitting: Internal Medicine

## 2018-11-18 ENCOUNTER — Ambulatory Visit (INDEPENDENT_AMBULATORY_CARE_PROVIDER_SITE_OTHER): Payer: Medicaid Other | Admitting: Licensed Clinical Social Worker

## 2018-11-18 ENCOUNTER — Encounter (HOSPITAL_COMMUNITY): Payer: Self-pay | Admitting: Licensed Clinical Social Worker

## 2018-11-18 ENCOUNTER — Other Ambulatory Visit: Payer: Self-pay

## 2018-11-18 ENCOUNTER — Ambulatory Visit: Payer: Self-pay | Attending: Internal Medicine | Admitting: Internal Medicine

## 2018-11-18 DIAGNOSIS — L249 Irritant contact dermatitis, unspecified cause: Secondary | ICD-10-CM

## 2018-11-18 DIAGNOSIS — J301 Allergic rhinitis due to pollen: Secondary | ICD-10-CM

## 2018-11-18 DIAGNOSIS — F17210 Nicotine dependence, cigarettes, uncomplicated: Secondary | ICD-10-CM

## 2018-11-18 DIAGNOSIS — E039 Hypothyroidism, unspecified: Secondary | ICD-10-CM

## 2018-11-18 DIAGNOSIS — F411 Generalized anxiety disorder: Secondary | ICD-10-CM

## 2018-11-18 DIAGNOSIS — J441 Chronic obstructive pulmonary disease with (acute) exacerbation: Secondary | ICD-10-CM

## 2018-11-18 DIAGNOSIS — R0982 Postnasal drip: Secondary | ICD-10-CM

## 2018-11-18 DIAGNOSIS — F17200 Nicotine dependence, unspecified, uncomplicated: Secondary | ICD-10-CM

## 2018-11-18 MED ORDER — PREDNISONE 20 MG PO TABS: 40.0000 mg | ORAL_TABLET | Freq: Every day | ORAL | 0 refills | Status: DC

## 2018-11-18 MED ORDER — BENZONATATE 100 MG PO CAPS: 100.0000 mg | ORAL_CAPSULE | Freq: Three times a day (TID) | ORAL | 0 refills | Status: DC | PRN

## 2018-11-18 MED ORDER — DOXYCYCLINE HYCLATE 100 MG PO TABS: 100.0000 mg | ORAL_TABLET | Freq: Two times a day (BID) | ORAL | 0 refills | Status: DC

## 2018-11-18 NOTE — Progress Notes (Signed)
Pt states her lungs hurt  Pt states she has been having trouble breathing  Pt states she has been using her breathing treatments and inhaler   Pt states she has some relief but not a lot  Pt states she didn't want to go to the hospital because of the COVID-19  Pt is requesting prednisone

## 2018-11-18 NOTE — Progress Notes (Signed)
Virtual Visit via Phone Note  I connected with [name] [name] on 11/18/18 at  3:30 PM EDT by phone enabled telemedicine application and verified that I am speaking with the correct person using two identifiers.   I discussed the limitations of evaluation and management by telemedicine and the availability of in person appointments. The patient expressed understanding and agreed to proceed.  History of Present Illness: Pt was referred to OP therapy by Psychiatrist, Dr. Casimiro Needle, for therapy anxiety, depression and Bipolar disorder.     Observations/Objective: Pt presented depressed via phone telemedicine. Pt discussed her psychiatric symptoms and current life events. Pt reports her depression and anxiety are both 7, scale 0-10 with 10 being the worse. Discussed her symptoms and current coping skills. Pt still thinks her anxiety and depressive symptoms are worse due to being confined in her apartment. However, she has made some friends in her [address] Apartment. Her mother's birthday (1st since her death) was 62[birth year]2. "I struggled that day." Discussed her grief symptoms on the day of her mother's birthday. Pt baked her mother a birthday cake and had some neighbors over for a celebration!     Assessment and Plan: Continue to provide therapy by phone every 2 weeks.   Follow Up Instructions:    I discussed the assessment and treatment plan with the patient. The patient was provided an opportunity to ask questions and all were answered. The patient agreed with the plan and demonstrated an understanding of the instructions.   The patient was advised to call back or seek an in-person evaluation if the symptoms worsen or if the condition fails to improve as anticipated.  I provided 40 minutes of non-face-to-face time during this encounter.   Bayley [place]arborough S, LCAS

## 2018-11-18 NOTE — Progress Notes (Signed)
Virtual Visit via Telephone Note Due to current restrictions/limitations of in-office visits due to the COVID-19 pandemic, this scheduled clinical appointment was converted to a telehealth visit  I connected with [name] [name] on 11/18/18 at 11:01 a.m EDT by telephone and verified that I am speaking with the correct person using two identifiers. I am in my office.  The patient is at home.  Only the patient and myself participated in this encounter  I discussed the limitations, risks, security and privacy concerns of performing an evaluation and management service by telephone and the availability of in person appointments. I also discussed with the patient that there may be a patient responsible charge related to this service. The patient expressed understanding and agreed to proceed.  History of Present Illness: Patient with history ofpre-DM, GERD, hypothyroidism, HTN, HL, CAD with previous stent, COPD, substance abuse and tobacco dependence.  Last seen 09/2018   I am "having a time with my allergies and my breathing." Reports nasal and chest congestion, drainage at back of throat, tightness in chest and increase wheezing.  Coughs up yellow phlegm.  Facial pain across nose area.  She feels th pollen playing a role.  Her building was pressure washed with solution that had bleach in it last wk.  This also made things worse in terms of her breathing.  No fever.  Using the Incruse, Breo and Albuterol inhalers.  Having to use Albuterol several times a day and duoneb TID.  Using the Flonase and OTC generic Zyrtec OTC.   -still smoking.  Using patches but not consistently.  "I'm trying."  Thyroid: TSH was 12 on last visit with me.  She was on levothyroxine 88 mcg at that time.  We subsequently increased the dose 100 mcg.  She reports compliance with taking the medication.  We had plan for her to return to the lab in 6 weeks for another TSH check.  However patient states that she lives in an assisted living  facility and they are not allowing any of the residents to go anywhere due to the [place] pandemic.  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  She had seen them in the past.  She has been tried with various steroid creams and emollients without improvement.  Observations/Objective: Results for orders placed or performed in visit on 09/17/18  CBC  Result Value Ref Range   WBC 6.9 3.4 - 10.8 x10E3/uL   RBC 4.59 3.77 - 5.28 x10E6/uL   Hemoglobin 13.5 11.1 - 15.9 g/dL   Hematocrit 41.5 34.0 - 46.6 %   MCV 90 79 - 97 fL   MCH 29.4 26.6 - 33.0 pg   MCHC 32.5 31.5 - 35.7 g/dL   RDW 15.5 (H) 11.7 - 15.4 %   Platelets 499 (H) 150 - 450 x10E3/uL  Comprehensive metabolic panel  Result Value Ref Range   Glucose 72 65 - 99 mg/dL   BUN 12 8 - 27 mg/dL   Creatinine, Ser 0.84 0.57 - 1.00 mg/dL   GFR calc non Af Amer 75 >59 mL/min/1.73   GFR calc Af Amer 86 >59 mL/min/1.73   BUN/Creatinine Ratio 14 12 - 28   Sodium 140 134 - 144 mmol/L   Potassium 4.6 3.5 - 5.2 mmol/L   Chloride 100 96 - 106 mmol/L   CO2 23 20 - 29 mmol/L   Calcium 9.8 8.7 - 10.3 mg/dL  Total Protein 6.7 6.0 - 8.5 g/dL   Albumin 4.7 3.8 - 4.8 g/dL   Globulin, Total 2.0 1.5 - 4.5 g/dL   Albumin/Globulin Ratio 2.4 (H) 1.2 - 2.2   Bilirubin Total <0.2 0.0 - 1.2 mg/dL   Alkaline Phosphatase 91 39 - 117 IU/L   AST 17 0 - 40 IU/L   ALT 18 0 - 32 IU/L  Lipid panel  Result Value Ref Range   Cholesterol, Total 164 100 - 199 mg/dL   Triglycerides 106 0 - 149 mg/dL   HDL 75 >39 mg/dL   VLDL Cholesterol Cal 21 5 - 40 mg/dL   LDL Calculated 68 0 - 99 mg/dL   Chol/HDL Ratio 2.2 0.0 - 4.4 ratio  TSH  Result Value Ref Range   TSH 12.130 (H) 0.450 - 4.500 uIU/mL     Assessment and Plan: 1. COPD exacerbation ([place]) 2. Seasonal allergic rhinitis due to pollen 3. Postnasal drip -Patient having what sounds like a COPD exacerbation  that is worsened with seasonal allergies the latter of which is also causing postnasal drip. -Give short course of steroid and antibiotics. -Continue to strongly encourage smoking cessation - benzonatate (TESSALON) 100 MG capsule; Take 1 capsule (100 mg total) by mouth 3 (three) times daily as needed for cough.  Dispense: 30 capsule; Refill: 0 - doxycycline (VIBRA-TABS) 100 MG tablet; Take 1 tablet (100 mg total) by mouth 2 (two) times daily.  Dispense: 20 tablet; Refill: 0 - predniSONE (DELTASONE) 20 MG tablet; Take 2 tablets (40 mg total) by mouth daily with breakfast.  Dispense: 10 tablet; Refill: 0  4. TOBACCO ABUSE See above.  Less than 5 minutes spent on counseling  5. Acquired hypothyroidism Continue levothyroxine.  We will plan to recheck a TSH on next visit  6. Irritant hand dermatitis - Ambulatory referral to Dermatology   Follow Up Instructions: F/u in 2 mths   I discussed the assessment and treatment plan with the patient. The patient was provided an opportunity to ask questions and all were answered. The patient agreed with the plan and demonstrated an understanding of the instructions.   The patient was advised to call back or seek an in-person evaluation if the symptoms worsen or if the condition fails to improve as anticipated.  I provided 18 minutes of non-face-to-face time during this encounter.   Karle Plumber, MD

## 2018-12-02 ENCOUNTER — Other Ambulatory Visit: Payer: Self-pay

## 2018-12-02 ENCOUNTER — Encounter (HOSPITAL_COMMUNITY): Payer: Self-pay | Admitting: Licensed Clinical Social Worker

## 2018-12-02 ENCOUNTER — Ambulatory Visit (INDEPENDENT_AMBULATORY_CARE_PROVIDER_SITE_OTHER): Payer: Self-pay | Admitting: Licensed Clinical Social Worker

## 2018-12-02 DIAGNOSIS — F411 Generalized anxiety disorder: Secondary | ICD-10-CM

## 2018-12-02 NOTE — Progress Notes (Signed)
Virtual Visit via Phone Note  I connected with [name] [name] on 12/02/18 at  3:30 PM EDT by phone enabled telemedicine application and verified that I am speaking with the correct person using two identifiers.   I discussed the limitations of evaluation and management by telemedicine and the availability of in person appointments. The patient expressed understanding and agreed to proceed.  History of Present Illness: Pt was referred to OP therapy by Psychiatrist, Dr. Casimiro Needle, for therapy anxiety, depression and Bipolar disorder.     Observations/Objective: Pt presented depressed via phone telemedicine. Pt discussed her psychiatric symptoms and current life events. "im tired of being in the house." Validated her feelings.Pt lives in a senior housing complex and are forbidden to leave the complex. " I'm not going to stay on the counseling call but 15 minutes today because I'm helping a neighbor to deliver food to some of the residents here." Together we decided to continue the phone session in the morning at 9am. Pt reports her anxiety is "about the same, probably because I'm stuck inside all the time and can't go anywhere." Asked open ended questions using empathic reflection.    PLAN: grief symptoms from mother's birthday?    Assessment and Plan: Continue to provide therapy by phone every 2 weeks.   Follow Up Instructions:    I discussed the assessment and treatment plan with the patient. The patient was provided an opportunity to ask questions and all were answered. The patient agreed with the plan and demonstrated an understanding of the instructions.   The patient was advised to call back or seek an in-person evaluation if the symptoms worsen or if the condition fails to improve as anticipated.  I provided 15 minutes of non-face-to-face time during this encounter.   Huntleigh Doolen S, LCAS

## 2018-12-03 ENCOUNTER — Other Ambulatory Visit: Payer: Self-pay

## 2018-12-03 ENCOUNTER — Ambulatory Visit (INDEPENDENT_AMBULATORY_CARE_PROVIDER_SITE_OTHER): Payer: Medicaid Other | Admitting: Licensed Clinical Social Worker

## 2018-12-03 ENCOUNTER — Telehealth: Payer: Self-pay | Admitting: Internal Medicine

## 2018-12-03 ENCOUNTER — Encounter (HOSPITAL_COMMUNITY): Payer: Self-pay | Admitting: Licensed Clinical Social Worker

## 2018-12-03 DIAGNOSIS — R05 Cough: Secondary | ICD-10-CM

## 2018-12-03 DIAGNOSIS — F411 Generalized anxiety disorder: Secondary | ICD-10-CM

## 2018-12-03 DIAGNOSIS — R059 Cough, unspecified: Secondary | ICD-10-CM

## 2018-12-03 NOTE — Telephone Encounter (Signed)
Patients call returned.  Patient identified by name and date of birth.  Patient states that after finishing her medication, doxycycline, she is not feeling any better. Patient finish medication yesterday.  Patient endorses productive cough.  Flem is thick and clear.  Patient endorses chest tightness. Patient denies fever, but does not have a thermometer.  Patient does say she feels hot.  Patient advised that PCP would be advised.  Return call would be made when information was available.  Patient acknowledged understanding of advice.

## 2018-12-03 NOTE — Telephone Encounter (Signed)
Patient called stating the medication she was prescribed did not work for her and believes the sinus infection has went down to her lungs. Please follow up.

## 2018-12-03 NOTE — Progress Notes (Signed)
Virtual Visit via Phone Note  I connected with [name] [name] on 12/03/18 at  9:00 AM EDT by phone enabled telemedicine application and verified that I am speaking with the correct person using two identifiers. Pt does not have a computer or internet.   I discussed the limitations of evaluation and management by telemedicine and the availability of in person appointments. The patient expressed understanding and agreed to proceed.  History of Present Illness: Pt was referred to OP therapy by Psychiatrist, Dr. Casimiro Needle, for therapy anxiety, depression and Bipolar disorder.     Observations/Objective: Pt presented depressed via phone telemedicine. Pt discussed her psychiatric symptoms and current life events. Pt reports her moods are stable and thinks her medications are working. Pt presents anxious this morning as she still struggles with the lock down. Validated her feelings. Pt discussed her new interactions with residents of her senior housing complex. Asked open ended questions about coming out of her apartment and making friends. "I feel accepted by the residents here. I feel safe." Discussed the importance of interacting and self care. Discussed her mother's death 1st anniversary is coming up June 12. Discussed stages of grief. Asked open ended questions and used empathic reflection. Processed with patient changing to sessions 1x per week during this time of grief. Patient was in agreement.          Assessment and Plan: During this time of grief continue therapy by phone every week.   Follow Up Instructions:    I discussed the assessment and treatment plan with the patient. The patient was provided an opportunity to ask questions and all were answered. The patient agreed with the plan and demonstrated an understanding of the instructions.   The patient was advised to call back or seek an in-person evaluation if the symptoms worsen or if the condition fails to improve as anticipated.  I  provided 45 minutes of non-face-to-face time during this encounter.   Amreen Raczkowski S, LCAS

## 2018-12-03 NOTE — Telephone Encounter (Signed)
Patient states that they are currently in lock down at the nursing home she is at. Please follow up  Advised to go to the ED or UC.

## 2018-12-08 NOTE — Telephone Encounter (Signed)
Patients call returned.  Patient identified by name and date of birth.  Patient contacted to make an appointment.  Patient does not have medical assistance at facility that she is at.  Patient states that the facility has her locked down due to illness.  Patient advised that she could leave facility for medical purposes.  Patient states she had been locked down for 14 days and did not want to endure that again. A cough was heard over the phone and patient did sound ill.  Patient advised that if she worsened that she needed to go to the hospital.  Appointment made.    Patient acknowledged understanding of advice.

## 2018-12-09 ENCOUNTER — Other Ambulatory Visit: Payer: Self-pay

## 2018-12-09 ENCOUNTER — Ambulatory Visit (HOSPITAL_COMMUNITY)
Admission: EM | Admit: 2018-12-09 | Discharge: 2018-12-09 | Disposition: A | Discharge status: Home / Self Care | Payer: Medicaid Other | Attending: Internal Medicine | Admitting: Internal Medicine

## 2018-12-09 ENCOUNTER — Other Ambulatory Visit: Payer: Self-pay | Admitting: Internal Medicine

## 2018-12-09 ENCOUNTER — Encounter (HOSPITAL_COMMUNITY): Payer: Self-pay

## 2018-12-09 ENCOUNTER — Ambulatory Visit (INDEPENDENT_AMBULATORY_CARE_PROVIDER_SITE_OTHER): Payer: Self-pay

## 2018-12-09 DIAGNOSIS — R0602 Shortness of breath: Secondary | ICD-10-CM

## 2018-12-09 DIAGNOSIS — I10 Essential (primary) hypertension: Secondary | ICD-10-CM

## 2018-12-09 DIAGNOSIS — F17200 Nicotine dependence, unspecified, uncomplicated: Secondary | ICD-10-CM

## 2018-12-09 DIAGNOSIS — F17210 Nicotine dependence, cigarettes, uncomplicated: Secondary | ICD-10-CM

## 2018-12-09 DIAGNOSIS — J441 Chronic obstructive pulmonary disease with (acute) exacerbation: Secondary | ICD-10-CM

## 2018-12-09 MED ORDER — AMOXICILLIN-POT CLAVULANATE 875-125 MG PO TABS: 1.0000 | ORAL_TABLET | Freq: Two times a day (BID) | ORAL | 0 refills | Status: DC

## 2018-12-09 MED ORDER — PREDNISONE 20 MG PO TABS: 40.0000 mg | ORAL_TABLET | Freq: Every day | ORAL | 0 refills | Status: DC

## 2018-12-09 MED FILL — !BRILINTA 90 MG TABLET: 30 days supply | Qty: 60 | Fill #3

## 2018-12-09 MED FILL — LEVOTHYROXINE 100 MCG TAB: 100 | 30 days supply | Qty: 30 | Fill #2

## 2018-12-09 MED FILL — FLUTICASONE PROP 50 MCG SPR: 50 | 30 days supply | Qty: 16 | Fill #1

## 2018-12-09 MED FILL — OMEPRAZOLE DR 40 MG CAPSULE: 40 | 30 days supply | Qty: 30 | Fill #2

## 2018-12-09 MED FILL — ESCITALOPRAM 20 MG TABLET: 20 | 30 days supply | Qty: 30 | Fill #1

## 2018-12-09 MED FILL — CYCLOBENZAPRINE 10 MG TAB: 10 | 10 days supply | Qty: 30 | Fill #1

## 2018-12-09 MED FILL — MONTELUKAST SOD 10 MG TAB: 10 | 30 days supply | Qty: 30 | Fill #2

## 2018-12-09 MED FILL — GABAPENTIN 600 MG TABLET: 600 | 30 days supply | Qty: 120 | Fill #2

## 2018-12-09 MED FILL — ?FUROSEMIDE 20 MG TABLET: 20 | 30 days supply | Qty: 30 | Fill #2

## 2018-12-09 MED FILL — ?LABETALOL HCL 100 MG TABS: 100 | 30 days supply | Qty: 60 | Fill #2

## 2018-12-09 MED FILL — LOSARTAN POTASSIUM 50 MG TA: 50 | 30 days supply | Qty: 45 | Fill #1

## 2018-12-09 NOTE — Telephone Encounter (Signed)
Patients call returned.  Patient identified by name and date of birth.  Patient called and was in  the Urgent Care for COPD exacerbation.  Prescribed antibiotic.  Patient has appointment June 1.

## 2018-12-09 NOTE — Discharge Instructions (Signed)
Continue to use your inhalers as needed.  Complete course of antibiotics.  5 days of prednisone.  Please refill your blood pressure medications.  Follow up with your primary care provider for recheck in the next 2 weeks.  If increased pain, fever, shortness of breath , or otherwise worsening please go to the ER.

## 2018-12-09 NOTE — ED Provider Notes (Signed)
[place]    CSN: [id number] Arrival date & time: 12/09/18  1212     History   Chief Complaint Chief Complaint  Patient presents with   Shortness of Breath    HPI [name] [name] is a 63 y.o. female.   [name] [name] presents with complaints of shortness of breath , cough. Taking OTC cold and flu medication which hasn't help. Has taken tussin which hasn't helped. Headache. Antibiotics started 5/6 following tele-visit. No improvement with treatment at that time. Symptoms for approximately 1 month. No known fevers. Inhalers and nebs only briefly help. Feels "raspy". No increased swelling to hands or feet. Out of her BP medications for the past two days, plans to go pick up refill today. Out of lasix. Nasal congestion. Uses daily flonase. Mild sore throat. Some left ear pain. No known ill contacts. No gi complaints. Feels like COPD exacerbations she has had in the past. Still smokes. Hx of recurrent copd exacerbations and chronic sinusitis. Hx of heart cath, gerd, back pain.    ROS per HPI, negative if not otherwise mentioned.      Past Medical History:  Diagnosis Date   Anxiety    takes Lexapro daily   Arthritis    "back, from neck down pass my bra area" (03/25/2017)   Asthma    Bartholin gland cyst 08/29/2011   Bruises easily    pt is on Effient   Chronic back pain    herniated nucleus pulposus   Chronic back pain    "neck to bra area; lower back" (03/25/2017)   COPD (chronic obstructive pulmonary disease) (HCC)    early stages   Coronary artery disease    Depression    takes Klonopin daily   Diabetes mellitus without complication (HCC)    Diverticulosis    GERD (gastroesophageal reflux disease)    takes Nexium daily   H/O hiatal hernia    Heart attack ([place]) 2011   Hemorrhoids    Hernia    Hyperlipidemia    takes Lipitor daily   Hypertension    takes Losartan daily and Labetalol bid   Hypothyroidism    takes Synthroid daily    Insomnia    hydroxyzine prn   Joint pain    Pneumonia    "couple times" (03/25/2017)   Pre-diabetes    "just found out 1 wk ago" (03/25/2017)   Psoriasis    elbows,knees,back   Shortness of breath    with exertion   Slowing of urinary stream    Stress incontinence     Patient Active Problem List   Diagnosis Date Noted   Bipolar I disorder ([place]) 09/17/2018   Insomnia 10/28/2017   COPD with acute exacerbation ([place]) 09/15/2017   Pre-diabetes 05/28/2017   Emesis, persistent 05/28/2017   QT prolongation 03/25/2017   Encounter for screening mammogram for breast cancer 09/11/2015   Psoriasis 06/22/2015   COPD (chronic obstructive pulmonary disease) ([place]) 05/18/2015   Anxiety and depression 07/10/2013   Essential hypertension, benign 06/07/2013   Dyslipidemia 06/07/2013   Asthma, chronic 06/07/2013   Emphysema lung ([place]) 06/07/2013   Chronic back pain 06/07/2013   CAD S/P percutaneous coronary angioplasty 06/07/2013   GERD (gastroesophageal reflux disease) 06/07/2013   Breast lump on left side at 3 o'clock position 10/13/2012   S/P abdominal hysterectomy and right salpingo-oophorectomy 08/29/2011   COPD exacerbation ([place]) 09/15/2009   TOBACCO ABUSE 07/17/2009   Chronic rhinitis 07/17/2009   Lung nodule < 6cm on CT 07/17/2009  ALLERGY, FOOD 07/17/2009   Hypothyroidism 12/22/2008    Past Surgical History:  Procedure Laterality Date   [place]   "left one of my ovaries"   BACK SURGERY     COLONOSCOPY     CORONARY ANGIOPLASTY WITH STENT PLACEMENT  2011 X2   "regular stents didn't work; had to go back in in ~ 1 month and put in medicated stents"   DILATION AND CURETTAGE OF UTERUS     ESOPHAGOGASTRODUODENOSCOPY     LEFT HEART CATH AND CORONARY ANGIOGRAPHY N/A 03/26/2017   Procedure: LEFT HEART CATH AND CORONARY ANGIOGRAPHY;  Surgeon: Belva Crome, MD;  Location: [facility];  Service: Cardiovascular;  Laterality: N/A;   LUMBAR LAMINECTOMY/DECOMPRESSION MICRODISCECTOMY  08/05/2011   Procedure: LUMBAR LAMINECTOMY/DECOMPRESSION MICRODISCECTOMY;  Surgeon: Otilio Connors, MD;  Location: [facility];  Service: Neurosurgery;  Laterality: Right;  Right Lumbar four-five extraforaminal discectomy   TONSILLECTOMY     as a child    OB History    Gravida  1   Para  1   Term  1   Preterm  0   AB  0   Living  1     SAB  0   TAB  0   Ectopic  0   Multiple  0   Live Births               Home Medications    Prior to Admission medications   Medication Sig Start Date End Date Taking? Authorizing Provider  albuterol (VENTOLIN HFA) 108 (90 Base) MCG/ACT inhaler INHALE 2 PUFFS INTO THE LUNGS EVERY 6 (SIX) HOURS AS NEEDED FOR WHEEZING OR SHORTNESS OF BREATH. 09/28/18   Ladell Pier, MD  amoxicillin-clavulanate (AUGMENTIN) 875-125 MG tablet Take 1 tablet by mouth every 12 (twelve) hours for 7 days. 12/09/18 12/16/18  Zigmund Gottron, NP  aspirin EC 81 MG tablet Take 81 mg by mouth daily.    [provider]  atorvastatin (LIPITOR) 40 MG tablet Take 1 tablet (40 mg total) by mouth at bedtime. 06/19/18   Ladell Pier, MD  benzonatate (TESSALON) 100 MG capsule Take 1 capsule (100 mg total) by mouth 3 (three) times daily as needed for cough. 11/18/18   Ladell Pier, MD  Blood Glucose Monitoring Suppl (TRUE METRIX METER) DEVI 1 kit by Does not apply route 4 (four) times daily. 04/04/17   Ena Dawley, Tiffany S, PA-C  BRILINTA 90 MG TABS tablet TAKE 1 TABLET BY MOUTH 2 (TWO) TIMES DAILY. 07/22/18   Ladell Pier, MD  busPIRone (BUSPAR) 15 MG tablet Take 1 tablet (15 mg total) by mouth 3 (three) times daily. 10/16/18 10/16/19  Plovsky, Berneta Sages, MD  Calcium 600 MG tablet Take 600 mg by mouth daily.  07/14/13   Patrecia Pour, NP  cyclobenzaprine (FLEXERIL) 10 MG tablet TAKE 1 TABLET BY MOUTH 3 TIMES DAILY AS NEEDED. 10/30/18   Ladell Pier, MD    doxepin (SINEQUAN) 50 MG capsule Take 1 capsule (50 mg total) by mouth at bedtime. 10/16/18 10/16/19  Plovsky, Berneta Sages, MD  escitalopram (LEXAPRO) 20 MG tablet TAKE 1 TABLET (20 MG TOTAL) BY MOUTH DAILY. 10/30/18   Ladell Pier, MD  fluticasone (FLONASE) 50 MCG/ACT nasal spray Place 2 sprays into both nostrils daily. 09/29/18   Ladell Pier, MD  fluticasone furoate-vilanterol (BREO ELLIPTA) 200-25 MCG/INH AEPB Inhale 1 puff into the lungs daily. 09/17/18  Ladell Pier, MD  furosemide (LASIX) 20 MG tablet TAKE 1 TABLET BY MOUTH DAILY. 09/28/18   Ladell Pier, MD  gabapentin (NEURONTIN) 600 MG tablet TAKE 1 TABLET BY MOUTH 4 TIMES DAILY 09/17/18   Ladell Pier, MD  glucose blood (TRUE METRIX BLOOD GLUCOSE TEST) test strip Use as instructed 01/21/18   Ladell Pier, MD  ipratropium-albuterol (DUONEB) 0.5-2.5 (3) MG/3ML SOLN Take 3 mLs every 6 (six) hours as needed by nebulization. Patient taking differently: Take 3 mLs by nebulization every 6 (six) hours as needed (wheezing).  05/28/17   Elsie Stain, MD  labetalol (NORMODYNE) 100 MG tablet TAKE 1 TABLET BY MOUTH 2 (TWO) TIMES DAILY. 09/28/18   Ladell Pier, MD  levothyroxine (SYNTHROID, LEVOTHROID) 100 MCG tablet Take 1 tablet (100 mcg total) by mouth daily. 09/25/18   Ladell Pier, MD  loratadine (CLARITIN) 10 MG tablet Take 1 tablet (10 mg total) by mouth daily. 09/18/17   Ladell Pier, MD  losartan (COZAAR) 50 MG tablet TAKE 1 AND 1/2 TABLETS BY MOUTH DAILY. 10/30/18   Ladell Pier, MD  mirtazapine (REMERON) 30 MG tablet Take 0.5 tablets (15 mg total) by mouth at bedtime. 10/16/18 10/16/19  Plovsky, Berneta Sages, MD  montelukast (SINGULAIR) 10 MG tablet TAKE 1 TABLET BY MOUTH AT BEDTIME. 09/28/18   Ladell Pier, MD  nicotine (NICODERM CQ - DOSED IN MG/24 HOURS) 21 mg/24hr patch Place 1 patch (21 mg total) onto the skin daily. 09/17/18   Ladell Pier, MD  omeprazole (PRILOSEC) 40 MG capsule Take 1  capsule (40 mg total) by mouth daily. 09/17/18   Ladell Pier, MD  ondansetron (ZOFRAN) 4 MG tablet Take 1 tablet (4 mg total) every 8 (eight) hours as needed by mouth for nausea or vomiting. 05/28/17   Elsie Stain, MD  predniSONE (DELTASONE) 20 MG tablet Take 2 tablets (40 mg total) by mouth daily with breakfast for 5 days. 12/09/18 12/14/18  Zigmund Gottron, NP  ranitidine (ZANTAC) 150 MG tablet Take 1 tablet (150 mg total) by mouth 2 (two) times daily. 05/12/18   Brayton Caves, PA-C  Sodium Chloride-Sodium Bicarb (NETI POT SINUS WASH) 2300-700 MG KIT Do nasal rinse three times a week as needed 09/17/18   Ladell Pier, MD  triamcinolone cream (KENALOG) 0.1 % APPLY 1 APPLICATION TOPICALLY 2 TIMES DAILY. 06/19/18   Ladell Pier, MD  TRUEPLUS LANCETS 28G MISC 28 g by Does not apply route QID. 04/04/17   Brayton Caves, PA-C  umeclidinium bromide (INCRUSE ELLIPTA) 62.5 MCG/INH AEPB Inhale 1 puff into the lungs daily. 09/29/18   Ladell Pier, MD    Family History Family History  Problem Relation Age of Onset   Heart disease Mother    Hypertension Mother    Stroke Mother    Mental illness Mother    Heart disease Father    Hypertension Father    Diabetes Father    Cancer Maternal Aunt        breast   Thyroid disease Paternal Aunt    Heart disease Maternal Grandfather    Anesthesia problems Daughter    Cancer Paternal Grandmother        mouth   Hypotension Neg Hx    Malignant hyperthermia Neg Hx    Pseudochol deficiency Neg Hx     Social History Social History   Tobacco Use   Smoking status: Current Every Day Smoker    Packs/day: 0.50  Years: 49.00    Pack years: 24.50    Types: Cigarettes   Smokeless tobacco: Never Used  Substance Use Topics   Alcohol use: Not Currently   Drug use: No    Types: Cocaine    Comment: used recently     Allergies   Avocado; Latex; and Codeine   Review of Systems Review of Systems   Physical  Exam Triage Vital Signs ED Triage Vitals  Enc Vitals Group     BP 12/09/18 1227 (!) 180/86     Pulse Rate 12/09/18 1227 94     Resp 12/09/18 1227 20     Temp 12/09/18 1227 98.4 F (36.9 C)     Temp Source 12/09/18 1227 Oral     SpO2 12/09/18 1227 98 %     Weight 12/09/18 1230 140 lb (63.5 kg)     Height --      Head Circumference --      Peak Flow --      Pain Score 12/09/18 1230 10     Pain Loc --      Pain Edu? --      Excl. in [place]? --    No data found.  Updated Vital Signs BP (!) 180/86 (BP Location: Right Arm)    Pulse 94    Temp 98.4 F (36.9 C) (Oral)    Resp 20    Wt 140 lb (63.5 kg)    SpO2 98%    BMI 26.02 kg/m    Physical Exam Constitutional:      General: She is not in acute distress.    Appearance: She is well-developed.  HENT:     Nose: Congestion present.  Eyes:     Pupils: Pupils are equal, round, and reactive to light.  Cardiovascular:     Rate and Rhythm: Normal rate and regular rhythm.     Heart sounds: Normal heart sounds.  Pulmonary:     Effort: Pulmonary effort is normal. Tachypnea present. No respiratory distress.     Breath sounds: No stridor. Decreased breath sounds present.  Skin:    General: Skin is warm and dry.  Neurological:     Mental Status: She is alert and oriented to person, place, and time.      UC Treatments / Results  Labs (all labs ordered are listed, but only abnormal results are displayed) Labs Reviewed - No data to display  EKG None  Radiology Dg Chest 2 View  Result Date: 12/09/2018 CLINICAL DATA:  Dyspnea EXAM: CHEST - 2 VIEW COMPARISON:  04/30/2018 chest radiograph. FINDINGS: Stable cardiomediastinal silhouette with normal heart size. No pneumothorax. No pleural effusion. Hyperinflated lungs. No pulmonary edema. No acute consolidative airspace disease. Chronic stable mild left basilar scarring. IMPRESSION: Hyperinflated lungs, suggesting COPD. No acute cardiopulmonary disease. Electronically Signed   By: Ilona Sorrel M.D.   On: 12/09/2018 12:57    Procedures Procedures (including critical care time)  Medications Ordered in UC Medications - No data to display  Initial Impression / Assessment and Plan / UC Course  I have reviewed the triage vital signs and the nursing notes.  Pertinent labs & imaging results that were available during my care of the patient were reviewed by me and considered in my medical decision making (see chart for details).     No hypoxia, no increased work of breathing. Afebrile. Per chart review patient with multiple recurrent visits for same. No peripheral edema. Chest xray without acute findings. Will treat as COPD  exacerbation. Return precautions provided. Patient verbalized understanding and agreeable to plan. Patient states she is going now to get her med refills. Ambulatory out of clinic without difficulty.   Final Clinical Impressions(s) / UC Diagnoses   Final diagnoses:  COPD exacerbation [facility])     Discharge Instructions     Continue to use your inhalers as needed.  Complete course of antibiotics.  5 days of prednisone.  Please refill your blood pressure medications.  Follow up with your primary care provider for recheck in the next 2 weeks.  If increased pain, fever, shortness of breath , or otherwise worsening please go to the ER.     ED Prescriptions    Medication Sig Dispense Auth. Provider   predniSONE (DELTASONE) 20 MG tablet Take 2 tablets (40 mg total) by mouth daily with breakfast for 5 days. 10 tablet Augusto Gamble B, NP   amoxicillin-clavulanate (AUGMENTIN) 875-125 MG tablet Take 1 tablet by mouth every 12 (twelve) hours for 7 days. 14 tablet Zigmund Gottron, NP     Controlled Substance Prescriptions [place] Controlled Substance Registry consulted? Not Applicable   Zigmund Gottron, NP 12/09/18 1320

## 2018-12-09 NOTE — ED Triage Notes (Signed)
Pt states she has a SOB x 10 days or more. Pt states she has been taking antibiotics for 10 days. Pt states she is still SOB.

## 2018-12-11 MED FILL — NICOTINE 21 MG/24HR PATCH: 21 | 28 days supply | Qty: 28 | Fill #0

## 2018-12-14 ENCOUNTER — Ambulatory Visit: Payer: Medicaid Other | Admitting: Internal Medicine

## 2018-12-16 MED FILL — $BREO ELLIPTA 200-25 MCG IN: 200-25 | 90 days supply | Qty: 180 | Fill #1

## 2018-12-17 ENCOUNTER — Encounter (HOSPITAL_COMMUNITY): Payer: Self-pay | Admitting: Licensed Clinical Social Worker

## 2018-12-17 ENCOUNTER — Other Ambulatory Visit: Payer: Self-pay

## 2018-12-17 ENCOUNTER — Ambulatory Visit (INDEPENDENT_AMBULATORY_CARE_PROVIDER_SITE_OTHER): Payer: Self-pay | Admitting: Licensed Clinical Social Worker

## 2018-12-17 DIAGNOSIS — F411 Generalized anxiety disorder: Secondary | ICD-10-CM

## 2018-12-17 NOTE — Progress Notes (Signed)
Virtual Visit via Phone Note  I connected with [name] [name] on 12/17/18 at 9:30am by phone enabled telemedicine application and verified that I am speaking with the correct person using two identifiers. Pt does not have a computer or internet.   I discussed the limitations of evaluation and management by telemedicine and the availability of in person appointments. The patient expressed understanding and agreed to proceed.  History of Present Illness: Pt was referred to OP therapy by Psychiatrist, Dr. Casimiro Needle, for therapy anxiety, depression and Bipolar disorder.     Observations/Objective: Pt presented depressed via phone telemedicine. Pt discussed her psychiatric symptoms and current life events. Pt reports her moods are stable and thinks her medications are working. Pt reports she is anxious this morning struggleing with the lock down and the violence in the nation. Validated her feelings. "I had to go to urgent care for my COPD and then I was on a 5 day lock down in my apartment. I'm feeling better now." Pt discussed her feelings about what's going on in our city. Assisted patient processing her feelings and reframing her thoughts. Discussed briefly the the upcoming anniversary of her mother's death. Will continue discussion at next session.             Assessment and Plan: During this time of grief continue therapy by phone every week.   Follow Up Instructions:    I discussed the assessment and treatment plan with the patient. The patient was provided an opportunity to ask questions and all were answered. The patient agreed with the plan and demonstrated an understanding of the instructions.   The patient was advised to call back or seek an in-person evaluation if the symptoms worsen or if the condition fails to improve as anticipated.  I provided 30 minutes of non-face-to-face time during this encounter.   Travas Schexnayder S, LCAS

## 2018-12-18 ENCOUNTER — Ambulatory Visit: Payer: Self-pay | Admitting: Internal Medicine

## 2018-12-24 ENCOUNTER — Ambulatory Visit (INDEPENDENT_AMBULATORY_CARE_PROVIDER_SITE_OTHER): Payer: Medicaid Other | Admitting: Licensed Clinical Social Worker

## 2018-12-24 ENCOUNTER — Encounter (HOSPITAL_COMMUNITY): Payer: Self-pay | Admitting: Licensed Clinical Social Worker

## 2018-12-24 ENCOUNTER — Other Ambulatory Visit: Payer: Self-pay

## 2018-12-24 DIAGNOSIS — F411 Generalized anxiety disorder: Secondary | ICD-10-CM

## 2018-12-24 NOTE — Progress Notes (Signed)
Virtual Visit via Phone Note  I connected with [name] [name] on 12/24/18 at 10:00am by phone enabled telemedicine application and verified that I am speaking with the correct person using two identifiers. Pt does not have a computer or internet.   I discussed the limitations of evaluation and management by telemedicine and the availability of in person appointments. The patient expressed understanding and agreed to proceed.  History of Present Illness: Pt was referred to OP therapy by Psychiatrist, Dr. Casimiro Needle, for therapy anxiety, depression and Bipolar disorder.     Observations/Objective: Pt presented depressed via phone telemedicine. Pt discussed her psychiatric symptoms and current life events. Pt reports her moods are up and down. "I have some bad days." Asked open ended questions and used empathic reflection. Patient is having some problems at her living facility. Her dog has been barking and she has 20 days to get the dog under control. Discussed options. Pt continues to cough and her COPD worsens. Educated pt on the effects of her tobacco use. Pt is experiencing grief due to the anniversary of her mother's passing. Assisted pt processing her feelings of grief. Pt shared her daughter is taking her to the beach for a week at the end of June.    PLAN: [facility]   Assessment and Plan: During this time of grief continue therapy by phone every week.   Follow Up Instructions:   I discussed the assessment and treatment plan with the patient. The patient was provided an opportunity to ask questions and all were answered. The patient agreed with the plan and demonstrated an understanding of the instructions.   The patient was advised to call back or seek an in-person evaluation if the symptoms worsen or if the condition fails to improve as anticipated.  I provided 45 minutes of non-face-to-face time during this encounter.   Emidio Warrell S, LCAS

## 2018-12-28 ENCOUNTER — Other Ambulatory Visit: Payer: Self-pay | Admitting: Internal Medicine

## 2018-12-28 MED FILL — ?LABETALOL HCL 100 MG TABS: 100 | 30 days supply | Qty: 60 | Fill #0

## 2018-12-29 MED FILL — DESOXIMETASONE 0.25 % OINT: 0.25 | 30 days supply | Qty: 60 | Fill #0

## 2018-12-31 ENCOUNTER — Ambulatory Visit (INDEPENDENT_AMBULATORY_CARE_PROVIDER_SITE_OTHER): Payer: Medicaid Other | Admitting: Licensed Clinical Social Worker

## 2018-12-31 ENCOUNTER — Other Ambulatory Visit: Payer: Self-pay

## 2018-12-31 ENCOUNTER — Encounter (HOSPITAL_COMMUNITY): Payer: Self-pay | Admitting: Licensed Clinical Social Worker

## 2018-12-31 DIAGNOSIS — F411 Generalized anxiety disorder: Secondary | ICD-10-CM
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
## 2019-01-01 ENCOUNTER — Other Ambulatory Visit: Payer: Self-pay | Admitting: Internal Medicine

## 2019-01-01 DIAGNOSIS — I10 Essential (primary) hypertension: Secondary | ICD-10-CM

## 2019-01-01 MED FILL — MONTELUKAST SOD 10 MG TAB: 10 | 30 days supply | Qty: 30 | Fill #0

## 2019-01-01 MED FILL — GABAPENTIN 600 MG TABLET: 600 | 30 days supply | Qty: 120 | Fill #3

## 2019-01-01 MED FILL — LOSARTAN POTASSIUM 50 MG TA: 50 | 30 days supply | Qty: 45 | Fill #2

## 2019-01-01 MED FILL — OMEPRAZOLE DR 40 MG CAPSULE: 40 | 30 days supply | Qty: 30 | Fill #3

## 2019-01-01 MED FILL — FLUTICASONE PROP 50 MCG SPR: 50 | 30 days supply | Qty: 16 | Fill #2

## 2019-01-01 MED FILL — CYCLOBENZAPRINE 10 MG TAB: 10 | 10 days supply | Qty: 30 | Fill #0

## 2019-01-01 MED FILL — NICOTINE 21 MG/24HR PATCH: 21 | 28 days supply | Qty: 28 | Fill #1

## 2019-01-01 MED FILL — LEVOTHYROXINE 100 MCG TAB: 100 | 30 days supply | Qty: 30 | Fill #3

## 2019-01-01 MED FILL — ?FUROSEMIDE 20 MG TABLET: 20 | 30 days supply | Qty: 30 | Fill #0

## 2019-01-01 MED FILL — ESCITALOPRAM 20 MG TABLET: 20 | 30 days supply | Qty: 30 | Fill #2

## 2019-01-07 ENCOUNTER — Encounter (HOSPITAL_COMMUNITY): Payer: Self-pay | Admitting: Licensed Clinical Social Worker

## 2019-01-07 ENCOUNTER — Ambulatory Visit (INDEPENDENT_AMBULATORY_CARE_PROVIDER_SITE_OTHER): Payer: Medicaid Other | Admitting: Licensed Clinical Social Worker

## 2019-01-07 ENCOUNTER — Other Ambulatory Visit: Payer: Self-pay

## 2019-01-07 DIAGNOSIS — F411 Generalized anxiety disorder: Secondary | ICD-10-CM

## 2019-01-07 NOTE — Progress Notes (Signed)
Virtual Visit via Phone Note  I connected with [name] [name] on 01/07/19 at 10:00am by phone enabled telemedicine application and verified that I am speaking with the correct person using two identifiers. Pt does not have a computer or internet.   I discussed the limitations of evaluation and management by telemedicine and the availability of in person appointments. The patient expressed understanding and agreed to proceed.  History of Present Illness: Pt was referred to OP therapy by Psychiatrist, Dr. Casimiro Needle, for her anxiety, depression and Bipolar disorder.     Observations/Objective: Pt presented depressed via phone telemedicine. Pt discussed her psychiatric symptoms and current life events. Patient is only available for 30 minutes today due to a dentist appointment. Patient is preparing for a beach trip with her daughter and grandson. This is the first trip since her mother's death. Used CBT concepts to assist patient with her thoughts and feelings of the trip, without her mother. Suggested to patient that she and her family can make new memories. Discussed what that may look like. Patient is trying to quit smoking. Yeah! She is taking patches to the beach to begin her cessation process. Congratulated and encouraged patient in her endeavor. Made new appointment after return from her trip.        Assessment and Plan: During this time of grief continue therapy by phone every week.   Follow Up Instructions:   I discussed the assessment and treatment plan with the patient. The patient was provided an opportunity to ask questions and all were answered. The patient agreed with the plan and demonstrated an understanding of the instructions.   The patient was advised to call back or seek an in-person evaluation if the symptoms worsen or if the condition fails to improve as anticipated.  I provided 30 minutes of non-face-to-face time during this encounter.   [name],[name] S, LCAS

## 2019-01-21 ENCOUNTER — Encounter (HOSPITAL_COMMUNITY): Payer: Self-pay | Admitting: Emergency Medicine

## 2019-01-21 ENCOUNTER — Ambulatory Visit (HOSPITAL_COMMUNITY)
Admission: EM | Admit: 2019-01-21 | Discharge: 2019-01-21 | Disposition: A | Discharge status: Home / Self Care | Payer: Self-pay | Attending: Family Medicine | Admitting: Family Medicine

## 2019-01-21 ENCOUNTER — Other Ambulatory Visit: Payer: Self-pay

## 2019-01-21 DIAGNOSIS — J441 Chronic obstructive pulmonary disease with (acute) exacerbation: Secondary | ICD-10-CM

## 2019-01-21 MED ORDER — PREDNISONE 10 MG PO TABS: 40.0000 mg | ORAL_TABLET | Freq: Every day | ORAL | 0 refills | Status: DC

## 2019-01-21 MED FILL — ?PREDNISONE 10 MG TABLET: 10 | 5 days supply | Qty: 20 | Fill #0

## 2019-01-21 MED FILL — ?ATORVASTATIN 40MG TABLET: 40 | 90 days supply | Qty: 90 | Fill #2

## 2019-01-21 NOTE — ED Triage Notes (Signed)
Patient reports symptoms started on Sunday night/monday morning.  Patient sob, patient has asthma and copd and reports she feels they are "acting up" Headache Cough Sob No n/v/d Patient has felt dizzy, has had nose bleed Patient lives in a retirement home

## 2019-01-21 NOTE — Discharge Instructions (Signed)
Treating you for a COPD exacerbation Take the prednisone as prescribed Keep using your inhalers and your albuterol nebulizer as needed Follow up as needed for continued or worsening symptoms

## 2019-01-21 NOTE — ED Provider Notes (Signed)
[place]    CSN: [id number] Arrival date & time: 01/21/19  1110     History   Chief Complaint Chief Complaint  Patient presents with  . Shortness of Breath    HPI [name] [name] is a 63 y.o. female.   Patient is a 63 year old female with past medical history of anxiety, arthritis, asthma, chronic pain, COPD, CAD, depression, diabetes, diverticulosis, GERD, hiatal hernia, heart attack, hemorrhoids, hyperlipidemia, hypertension, hypothyroidism, pneumonia, psoriasis, bipolar 1 disorder. She is here today with complaints of shortness of breath.  She has been seen twice since February for COPD exacerbation.  Last visit was on May 27 where she was given antibiotics and steroids to treat.  Reports that she felt better after treatment.  Today she is complaining of mild shortness of breath, wheezing that started Sunday night.  Feels like it is a COPD exacerbation.  Symptoms have been constant.  She has been using her nebulizer machine without much relief.  She is also been taking Claritin-D and using Flonase for nasal congestion and postnasal drip.  She has had mild headache associated.  Denies any fevers, body aches, chills, night sweats.  Patient lives in a retirement home but reports that they have been checking them every day for fever and they have been a lot down.  They have had no known COVID exposures.  ROS per HPI      Past Medical History:  Diagnosis Date  . Anxiety    takes Lexapro daily  . Arthritis    "back, from neck down pass my bra area" (03/25/2017)  . Asthma   . Bartholin gland cyst 08/29/2011  . Bruises easily    pt is on Effient  . Chronic back pain    herniated nucleus pulposus  . Chronic back pain    "neck to bra area; lower back" (03/25/2017)  . COPD (chronic obstructive pulmonary disease) ([place])    early stages  . Coronary artery disease   . Depression    takes Klonopin daily  . Diabetes mellitus without complication ([place])   . Diverticulosis    . GERD (gastroesophageal reflux disease)    takes Nexium daily  . H/O hiatal hernia   . Heart attack ([place]) 2011  . Hemorrhoids   . Hernia   . Hyperlipidemia    takes Lipitor daily  . Hypertension    takes Losartan daily and Labetalol bid  . Hypothyroidism    takes Synthroid daily  . Insomnia    hydroxyzine prn  . Joint pain   . Pneumonia    "couple times" (03/25/2017)  . Pre-diabetes    "just found out 1 wk ago" (03/25/2017)  . Psoriasis    elbows,knees,back  . Shortness of breath    with exertion  . Slowing of urinary stream   . Stress incontinence     Patient Active Problem List   Diagnosis Date Noted  . Bipolar I disorder ([place]) 09/17/2018  . Insomnia 10/28/2017  . COPD with acute exacerbation ([place]) 09/15/2017  . Pre-diabetes 05/28/2017  . Emesis, persistent 05/28/2017  . QT prolongation 03/25/2017  . Encounter for screening mammogram for breast cancer 09/11/2015  . Psoriasis 06/22/2015  . COPD (chronic obstructive pulmonary disease) ([place]) 05/18/2015  . Anxiety and depression 07/10/2013  . Essential hypertension, benign 06/07/2013  . Dyslipidemia 06/07/2013  . Asthma, chronic 06/07/2013  . Emphysema lung ([place]) 06/07/2013  . Chronic back pain 06/07/2013  . CAD S/P percutaneous coronary angioplasty 06/07/2013  . GERD (gastroesophageal reflux  disease) 06/07/2013  . Breast lump on left side at 3 o'clock position 10/13/2012  . S/P abdominal hysterectomy and right salpingo-oophorectomy 08/29/2011  . COPD exacerbation ([place]) 09/15/2009  . TOBACCO ABUSE 07/17/2009  . Chronic rhinitis 07/17/2009  . Lung nodule < 6cm on CT 07/17/2009  . ALLERGY, FOOD 07/17/2009  . Hypothyroidism 12/22/2008    Past Surgical History:  Procedure Laterality Date  . ABDOMINAL EXPLORATION SURGERY  1977  . ABDOMINAL HYSTERECTOMY  1977   "left one of my ovaries"  . BACK SURGERY    . COLONOSCOPY    . CORONARY ANGIOPLASTY WITH STENT PLACEMENT  2011 X2   "regular stents didn't work; had to  go back in in ~ 1 month and put in medicated stents"  . DILATION AND CURETTAGE OF UTERUS    . ESOPHAGOGASTRODUODENOSCOPY    . LEFT HEART CATH AND CORONARY ANGIOGRAPHY N/A 03/26/2017   Procedure: LEFT HEART CATH AND CORONARY ANGIOGRAPHY;  Surgeon: Belva Crome, MD;  Location: [facility];  Service: Cardiovascular;  Laterality: N/A;  . LUMBAR LAMINECTOMY/DECOMPRESSION MICRODISCECTOMY  08/05/2011   Procedure: LUMBAR LAMINECTOMY/DECOMPRESSION MICRODISCECTOMY;  Surgeon: Otilio Connors, MD;  Location: [facility];  Service: Neurosurgery;  Laterality: Right;  Right Lumbar four-five extraforaminal discectomy  . TONSILLECTOMY     as a child    OB History    Gravida  1   Para  1   Term  1   Preterm  0   AB  0   Living  1     SAB  0   TAB  0   Ectopic  0   Multiple  0   Live Births               Home Medications    Prior to Admission medications   Medication Sig Start Date End Date Taking? Authorizing Provider  albuterol (VENTOLIN HFA) 108 (90 Base) MCG/ACT inhaler INHALE 2 PUFFS INTO THE LUNGS EVERY 6 (SIX) HOURS AS NEEDED FOR WHEEZING OR SHORTNESS OF BREATH. 09/28/18   Ladell Pier, MD  aspirin EC 81 MG tablet Take 81 mg by mouth daily.    [provider]  atorvastatin (LIPITOR) 40 MG tablet Take 1 tablet (40 mg total) by mouth at bedtime. Patient taking differently: Take 40 mg by mouth at bedtime. Ran out of medicine 06/19/18   Ladell Pier, MD  benzonatate (TESSALON) 100 MG capsule Take 1 capsule (100 mg total) by mouth 3 (three) times daily as needed for cough. 11/18/18   Ladell Pier, MD  Blood Glucose Monitoring Suppl (TRUE METRIX METER) DEVI 1 kit by Does not apply route 4 (four) times daily. 04/04/17   Ena Dawley, Tiffany S, PA-C  BRILINTA 90 MG TABS tablet TAKE 1 TABLET BY MOUTH 2 (TWO) TIMES DAILY. 07/22/18   Ladell Pier, MD  busPIRone (BUSPAR) 15 MG tablet Take 1 tablet (15 mg total) by mouth 3 (three) times daily. 10/16/18 10/16/19   Plovsky, Berneta Sages, MD  Calcium 600 MG tablet Take 600 mg by mouth daily.  07/14/13   Patrecia Pour, NP  cyclobenzaprine (FLEXERIL) 10 MG tablet TAKE 1 TABLET BY MOUTH 3 TIMES DAILY AS NEEDED. 01/01/19   Ladell Pier, MD  doxepin (SINEQUAN) 50 MG capsule Take 1 capsule (50 mg total) by mouth at bedtime. 10/16/18 10/16/19  Plovsky, Berneta Sages, MD  escitalopram (LEXAPRO) 20 MG tablet TAKE 1 TABLET (20 MG TOTAL) BY MOUTH DAILY. 10/30/18   Ladell Pier, MD  fluticasone (FLONASE) 50 MCG/ACT nasal spray Place 2 sprays into both nostrils daily. 09/29/18   Ladell Pier, MD  fluticasone furoate-vilanterol (BREO ELLIPTA) 200-25 MCG/INH AEPB Inhale 1 puff into the lungs daily. 09/17/18   Ladell Pier, MD  furosemide (LASIX) 20 MG tablet TAKE 1 TABLET BY MOUTH DAILY. 01/01/19   Ladell Pier, MD  gabapentin (NEURONTIN) 600 MG tablet TAKE 1 TABLET BY MOUTH 4 TIMES DAILY 09/17/18   Ladell Pier, MD  glucose blood (TRUE METRIX BLOOD GLUCOSE TEST) test strip Use as instructed 01/21/18   Ladell Pier, MD  ipratropium-albuterol (DUONEB) 0.5-2.5 (3) MG/3ML SOLN Take 3 mLs every 6 (six) hours as needed by nebulization. Patient taking differently: Take 3 mLs by nebulization every 6 (six) hours as needed (wheezing).  05/28/17   Elsie Stain, MD  labetalol (NORMODYNE) 100 MG tablet TAKE 1 TABLET BY MOUTH 2 (TWO) TIMES DAILY. 12/28/18   Ladell Pier, MD  levothyroxine (SYNTHROID, LEVOTHROID) 100 MCG tablet Take 1 tablet (100 mcg total) by mouth daily. 09/25/18   Ladell Pier, MD  loratadine (CLARITIN) 10 MG tablet Take 1 tablet (10 mg total) by mouth daily. 09/18/17   Ladell Pier, MD  losartan (COZAAR) 50 MG tablet TAKE 1 AND 1/2 TABLETS BY MOUTH DAILY. 10/30/18   Ladell Pier, MD  mirtazapine (REMERON) 30 MG tablet Take 0.5 tablets (15 mg total) by mouth at bedtime. 10/16/18 10/16/19  Plovsky, Berneta Sages, MD  montelukast (SINGULAIR) 10 MG tablet TAKE 1 TABLET BY MOUTH AT BEDTIME.  01/01/19   Ladell Pier, MD  nicotine (NICODERM CQ - DOSED IN MG/24 HOURS) 21 mg/24hr patch PLACE 1 PATCH (21 MG TOTAL) ONTO THE SKIN DAILY. 12/11/18   Ladell Pier, MD  omeprazole (PRILOSEC) 40 MG capsule Take 1 capsule (40 mg total) by mouth daily. 09/17/18   Ladell Pier, MD  ondansetron (ZOFRAN) 4 MG tablet Take 1 tablet (4 mg total) every 8 (eight) hours as needed by mouth for nausea or vomiting. 05/28/17   Elsie Stain, MD  predniSONE (DELTASONE) 10 MG tablet Take 4 tablets (40 mg total) by mouth daily for 5 days. 01/21/19 01/26/19  Loura Halt A, NP  ranitidine (ZANTAC) 150 MG tablet Take 1 tablet (150 mg total) by mouth 2 (two) times daily. 05/12/18   Brayton Caves, PA-C  Sodium Chloride-Sodium Bicarb (NETI POT SINUS WASH) 2300-700 MG KIT Do nasal rinse three times a week as needed 09/17/18   Ladell Pier, MD  triamcinolone cream (KENALOG) 0.1 % APPLY 1 APPLICATION TOPICALLY 2 TIMES DAILY. 06/19/18   Ladell Pier, MD  TRUEPLUS LANCETS 28G MISC 28 g by Does not apply route QID. 04/04/17   Brayton Caves, PA-C  umeclidinium bromide (INCRUSE ELLIPTA) 62.5 MCG/INH AEPB Inhale 1 puff into the lungs daily. 09/29/18   Ladell Pier, MD    Family History Family History  Problem Relation Age of Onset  . Heart disease Mother   . Hypertension Mother   . Stroke Mother   . Mental illness Mother   . Heart disease Father   . Hypertension Father   . Diabetes Father   . Cancer Maternal Aunt        breast  . Thyroid disease Paternal Aunt   . Heart disease Maternal Grandfather   . Anesthesia problems Daughter   . Cancer Paternal Grandmother        mouth  . Hypotension Neg Hx   .  Malignant hyperthermia Neg Hx   . Pseudochol deficiency Neg Hx     Social History Social History   Tobacco Use  . Smoking status: Current Every Day Smoker    Packs/day: 0.50    Years: 49.00    Pack years: 24.50    Types: Cigarettes  . Smokeless tobacco: Never Used  Substance Use  Topics  . Alcohol use: Not Currently  . Drug use: No    Types: Cocaine    Comment: used recently     Allergies   Avocado, Latex, and Codeine   Review of Systems Review of Systems   Physical Exam Triage Vital Signs ED Triage Vitals  Enc Vitals Group     BP      Pulse      Resp      Temp      Temp src      SpO2      Weight      Height      Head Circumference      Peak Flow      Pain Score      Pain Loc      Pain Edu?      Excl. in [place]?    No data found.  Updated Vital Signs BP 121/82 (BP Location: Left Arm)   Pulse 70   Temp 98.2 F (36.8 C) (Oral)   Resp 20   SpO2 98%   Visual Acuity Right Eye Distance:   Left Eye Distance:   Bilateral Distance:    Right Eye Near:   Left Eye Near:    Bilateral Near:     Physical Exam Vitals signs and nursing note reviewed.  Constitutional:      General: She is not in acute distress.    Appearance: She is well-developed. She is not ill-appearing, toxic-appearing or diaphoretic.  HENT:     Head: Normocephalic and atraumatic.  Neck:     Musculoskeletal: Normal range of motion.  Cardiovascular:     Rate and Rhythm: Normal rate and regular rhythm.  Pulmonary:     Effort: Pulmonary effort is normal.     Breath sounds: Examination of the right-upper field reveals wheezing. Examination of the left-upper field reveals wheezing. Examination of the right-middle field reveals wheezing. Examination of the left-middle field reveals wheezing. Examination of the right-lower field reveals wheezing. Examination of the left-lower field reveals wheezing. Wheezing present.  Musculoskeletal: Normal range of motion.  Skin:    General: Skin is warm and dry.  Neurological:     Mental Status: She is alert.  Psychiatric:        Mood and Affect: Mood normal.      UC Treatments / Results  Labs (all labs ordered are listed, but only abnormal results are displayed) Labs Reviewed - No data to display  EKG   Radiology No results  found.  Procedures Procedures (including critical care time)  Medications Ordered in UC Medications - No data to display  Initial Impression / Assessment and Plan / UC Course  I have reviewed the triage vital signs and the nursing notes.  Pertinent labs & imaging results that were available during my care of the patient were reviewed by me and considered in my medical decision making (see chart for details).     Diffuse expiratory wheezing in all lung fields. Treated for COPD exacerbation with prednisone burst. Not high suspicion for COVID No need for testing. Recommended continue using the nebulizer machine as needed for cough,  wheezing, SOB. Continue the allergy meds.  Follow up as needed for continued or worsening symptoms  Final Clinical Impressions(s) / UC Diagnoses   Final diagnoses:  COPD exacerbation [facility])     Discharge Instructions     Treating you for a COPD exacerbation Take the prednisone as prescribed Keep using your inhalers and your albuterol nebulizer as needed Follow up as needed for continued or worsening symptoms     ED Prescriptions    Medication Sig Dispense Auth. Provider   predniSONE (DELTASONE) 10 MG tablet Take 4 tablets (40 mg total) by mouth daily for 5 days. 20 tablet Loura Halt A, NP     Controlled Substance Prescriptions [place] Controlled Substance Registry consulted? Not Applicable   Orvan July, NP 01/21/19 1243

## 2019-01-27 ENCOUNTER — Ambulatory Visit (INDEPENDENT_AMBULATORY_CARE_PROVIDER_SITE_OTHER): Payer: Medicaid Other | Admitting: Psychiatry

## 2019-01-27 ENCOUNTER — Ambulatory Visit (INDEPENDENT_AMBULATORY_CARE_PROVIDER_SITE_OTHER): Payer: Medicaid Other | Admitting: Licensed Clinical Social Worker

## 2019-01-27 ENCOUNTER — Encounter (HOSPITAL_COMMUNITY): Payer: Self-pay | Admitting: Licensed Clinical Social Worker

## 2019-01-27 ENCOUNTER — Other Ambulatory Visit: Payer: Self-pay

## 2019-01-27 DIAGNOSIS — F411 Generalized anxiety disorder: Secondary | ICD-10-CM

## 2019-01-27 DIAGNOSIS — F419 Anxiety disorder, unspecified: Secondary | ICD-10-CM

## 2019-01-27 DIAGNOSIS — F32A Depression, unspecified: Secondary | ICD-10-CM

## 2019-01-27 DIAGNOSIS — F329 Major depressive disorder, single episode, unspecified: Secondary | ICD-10-CM

## 2019-01-27 MED ORDER — BUSPIRONE HCL 15 MG PO TABS: 15.0000 mg | ORAL_TABLET | Freq: Three times a day (TID) | ORAL | 1 refills | Status: DC

## 2019-01-27 MED ORDER — ESCITALOPRAM OXALATE 20 MG PO TABS: 20.0000 mg | ORAL_TABLET | Freq: Every day | ORAL | 2 refills | Status: DC

## 2019-01-27 MED ORDER — MIRTAZAPINE 30 MG PO TABS: 15.0000 mg | ORAL_TABLET | Freq: Every day | ORAL | 1 refills | Status: DC

## 2019-01-27 MED ORDER — DOXEPIN HCL 50 MG PO CAPS: 50.0000 mg | ORAL_CAPSULE | Freq: Every day | ORAL | 2 refills | Status: DC

## 2019-01-27 NOTE — Progress Notes (Signed)
Virtual Visit via Phone Note  I connected with [name] [name] on 01/27/19 at 10:00am by phone enabled telemedicine application and verified that I am speaking with the correct person using two identifiers. Pt does not have a computer or internet.   I discussed the limitations of evaluation and management by telemedicine and the availability of in person appointments. The patient expressed understanding and agreed to proceed.  History of Present Illness: Pt was referred to OP therapy by Psychiatrist, Dr. Casimiro Needle, for her anxiety, depression and Bipolar disorder.     Observations/Objective:  Pt presents for her therapy session by phone today. Pt discussed her psychiatric symptoms and current life events. Pt reports she went to the ED on 7/9 due to her COPD exacerbation. This is the second ED visit within 6 weeks due to her COPD and continued smoking. Pt reports she has panic attacks when she can't catch her breath. Educated pt on breathing exercises  Provided counseling on smoking cessation. Reviewed tx plan with patient and she verbalized acceptance of the tx plan.         Assessment and Plan: Pt continues to want weekly therapy by phone. She does not want to come into the office for therapy.   Follow Up Instructions:   I discussed the assessment and treatment plan with the patient. The patient was provided an opportunity to ask questions and all were answered. The patient agreed with the plan and demonstrated an understanding of the instructions.   The patient was advised to call back or seek an in-person evaluation if the symptoms worsen or if the condition fails to improve as anticipated.  I provided 35 minutes of non-face-to-face time during this encounter.   Tamatha Gadbois S, LCAS

## 2019-01-27 NOTE — Progress Notes (Signed)
[facility] MD/PA/NP OP Progress Note  01/27/2019 2:10 PM [name] [name]  MRN:  [id number]  Chief Complaint: Anxiety HPI:  Today the patient is doing well.  She is being treated here for generalized anxiety disorder.  She does well.  She has momentary periods of anxiety but they are always induced by something that is threatening or exciting for her.  When everything is calm and quiet around her she is well.  In essence these are minor anxiety attacks.  I do not think they should be addressed pharmacologically.  Note is the patient is in therapy which is great.  The patient takes BuSpar and Lexapro and does well.  She denies daily depression.  She is enjoying life.  She went to the beach with her family and enjoyed herself.  She has no evidence of psychosis.  She denies the use of alcohol or drugs.  The setting she lives in is great.  She has a dog which helps her a great deal.  Overall her mood is stable.  She is not fatigued.  She is functioning very well. Visit Diagnosis:    ICD-10-CM   1. Anxiety and depression  F41.9 escitalopram (LEXAPRO) 20 MG tablet   F32.9   2. Depression, unspecified depression type  F32.9 mirtazapine (REMERON) 30 MG tablet  3. GAD (generalized anxiety disorder)  F41.1 mirtazapine (REMERON) 30 MG tablet    busPIRone (BUSPAR) 15 MG tablet    Past Psychiatric History: history of substance use, crack cocaine addiction, alcohol use disorder  Past Medical History:  Past Medical History:  Diagnosis Date  . Anxiety    takes Lexapro daily  . Arthritis    "back, from neck down pass my bra area" (03/25/2017)  . Asthma   . Bartholin gland cyst 08/29/2011  . Bruises easily    pt is on Effient  . Chronic back pain    herniated nucleus pulposus  . Chronic back pain    "neck to bra area; lower back" (03/25/2017)  . COPD (chronic obstructive pulmonary disease) ([place])    early stages  . Coronary artery disease   . Depression    takes Klonopin daily  . Diabetes mellitus  without complication ([place])   . Diverticulosis   . GERD (gastroesophageal reflux disease)    takes Nexium daily  . H/O hiatal hernia   . Heart attack ([place]) 2011  . Hemorrhoids   . Hernia   . Hyperlipidemia    takes Lipitor daily  . Hypertension    takes Losartan daily and Labetalol bid  . Hypothyroidism    takes Synthroid daily  . Insomnia    hydroxyzine prn  . Joint pain   . Pneumonia    "couple times" (03/25/2017)  . Pre-diabetes    "just found out 1 wk ago" (03/25/2017)  . Psoriasis    elbows,knees,back  . Shortness of breath    with exertion  . Slowing of urinary stream   . Stress incontinence     Past Surgical History:  Procedure Laterality Date  . ABDOMINAL EXPLORATION SURGERY  1977  . ABDOMINAL HYSTERECTOMY  1977   "left one of my ovaries"  . BACK SURGERY    . COLONOSCOPY    . CORONARY ANGIOPLASTY WITH STENT PLACEMENT  2011 X2   "regular stents didn't work; had to go back in in ~ 1 month and put in medicated stents"  . DILATION AND CURETTAGE OF UTERUS    . ESOPHAGOGASTRODUODENOSCOPY    . LEFT  HEART CATH AND CORONARY ANGIOGRAPHY N/A 03/26/2017   Procedure: LEFT HEART CATH AND CORONARY ANGIOGRAPHY;  Surgeon: Belva Crome, MD;  Location: [facility];  Service: Cardiovascular;  Laterality: N/A;  . LUMBAR LAMINECTOMY/DECOMPRESSION MICRODISCECTOMY  08/05/2011   Procedure: LUMBAR LAMINECTOMY/DECOMPRESSION MICRODISCECTOMY;  Surgeon: Otilio Connors, MD;  Location: [facility];  Service: Neurosurgery;  Laterality: Right;  Right Lumbar four-five extraforaminal discectomy  . TONSILLECTOMY     as a child    Family Psychiatric History: See intake H&P for full details. Reviewed, with no updates at this time.   Family History:  Family History  Problem Relation Age of Onset  . Heart disease Mother   . Hypertension Mother   . Stroke Mother   . Mental illness Mother   . Heart disease Father   . Hypertension Father   . Diabetes Father   . Cancer Maternal Aunt         breast  . Thyroid disease Paternal Aunt   . Heart disease Maternal Grandfather   . Anesthesia problems Daughter   . Cancer Paternal Grandmother        mouth  . Hypotension Neg Hx   . Malignant hyperthermia Neg Hx   . Pseudochol deficiency Neg Hx     Social History:  Social History   Socioeconomic History  . Marital status: Divorced    Spouse name: Not on file  . Number of children: Not on file  . Years of education: Not on file  . Highest education level: Not on file  Occupational History  . Not on file  Social Needs  . Financial resource strain: Not hard at all  . Food insecurity    Worry: Never true    Inability: Never true  . Transportation needs    Medical: No    Non-medical: No  Tobacco Use  . Smoking status: Current Every Day Smoker    Packs/day: 0.50    Years: 49.00    Pack years: 24.50    Types: Cigarettes  . Smokeless tobacco: Never Used  Substance and Sexual Activity  . Alcohol use: Not Currently  . Drug use: No    Types: Cocaine    Comment: used recently  . Sexual activity: Not Currently    Birth control/protection: Surgical  Lifestyle  . Physical activity    Days per week: 7 days    Minutes per session: 10 min  . Stress: Only a little  Relationships  . Social Herbalist on phone: Not on file    Gets together: Not on file    Attends religious service: Not on file    Active member of club or organization: Not on file    Attends meetings of clubs or organizations: Not on file    Relationship status: Not on file  Other Topics Concern  . Not on file  Social History Narrative  . Not on file    Allergies:  Allergies  Allergen Reactions  . Avocado Anaphylaxis  . Latex Shortness Of Breath and Rash  . Codeine Nausea Only    Metabolic Disorder Labs: Lab Results  Component Value Date   HGBA1C 5.9 05/12/2018   No results found for: PROLACTIN Lab Results  Component Value Date   CHOL 164 09/17/2018   TRIG 106 09/17/2018   HDL  75 09/17/2018   CHOLHDL 2.2 09/17/2018   VLDL 15 04/16/2016   LDLCALC 68 09/17/2018   LDLCALC 138 (H) 04/16/2016   Lab Results  Component Value Date   TSH 12.130 (H) 09/17/2018   TSH 0.317 (L) 09/18/2017    Therapeutic Level Labs: No results found for: LITHIUM No results found for: VALPROATE No components found for:  CBMZ  Current Medications: Current Outpatient Medications  Medication Sig Dispense Refill  . albuterol (VENTOLIN HFA) 108 (90 Base) MCG/ACT inhaler INHALE 2 PUFFS INTO THE LUNGS EVERY 6 (SIX) HOURS AS NEEDED FOR WHEEZING OR SHORTNESS OF BREATH. 18 g 2  . aspirin EC 81 MG tablet Take 81 mg by mouth daily.    Marland Kitchen atorvastatin (LIPITOR) 40 MG tablet Take 1 tablet (40 mg total) by mouth at bedtime. (Patient taking differently: Take 40 mg by mouth at bedtime. Ran out of medicine) 90 tablet 2  . benzonatate (TESSALON) 100 MG capsule Take 1 capsule (100 mg total) by mouth 3 (three) times daily as needed for cough. 30 capsule 0  . Blood Glucose Monitoring Suppl (TRUE METRIX METER) DEVI 1 kit by Does not apply route 4 (four) times daily. 1 Device 0  . BRILINTA 90 MG TABS tablet TAKE 1 TABLET BY MOUTH 2 (TWO) TIMES DAILY. 60 tablet 6  . busPIRone (BUSPAR) 15 MG tablet Take 1 tablet (15 mg total) by mouth 3 (three) times daily. 270 tablet 1  . Calcium 600 MG tablet Take 600 mg by mouth daily.  60 tablet   . cyclobenzaprine (FLEXERIL) 10 MG tablet TAKE 1 TABLET BY MOUTH 3 TIMES DAILY AS NEEDED. 30 tablet 1  . doxepin (SINEQUAN) 50 MG capsule Take 1 capsule (50 mg total) by mouth at bedtime. 30 capsule 2  . escitalopram (LEXAPRO) 20 MG tablet Take 1 tablet (20 mg total) by mouth daily. 30 tablet 2  . fluticasone (FLONASE) 50 MCG/ACT nasal spray Place 2 sprays into both nostrils daily. 16 g 6  . fluticasone furoate-vilanterol (BREO ELLIPTA) 200-25 MCG/INH AEPB Inhale 1 puff into the lungs daily. 60 each 6  . furosemide (LASIX) 20 MG tablet TAKE 1 TABLET BY MOUTH DAILY. 30 tablet 2  .  gabapentin (NEURONTIN) 600 MG tablet TAKE 1 TABLET BY MOUTH 4 TIMES DAILY 120 tablet 3  . glucose blood (TRUE METRIX BLOOD GLUCOSE TEST) test strip Use as instructed 100 each 12  . ipratropium-albuterol (DUONEB) 0.5-2.5 (3) MG/3ML SOLN Take 3 mLs every 6 (six) hours as needed by nebulization. (Patient taking differently: Take 3 mLs by nebulization every 6 (six) hours as needed (wheezing). ) 360 mL 3  . labetalol (NORMODYNE) 100 MG tablet TAKE 1 TABLET BY MOUTH 2 (TWO) TIMES DAILY. 60 tablet 2  . levothyroxine (SYNTHROID, LEVOTHROID) 100 MCG tablet Take 1 tablet (100 mcg total) by mouth daily. 30 tablet 3  . loratadine (CLARITIN) 10 MG tablet Take 1 tablet (10 mg total) by mouth daily. 30 tablet 11  . losartan (COZAAR) 50 MG tablet TAKE 1 AND 1/2 TABLETS BY MOUTH DAILY. 45 tablet 2  . mirtazapine (REMERON) 30 MG tablet Take 0.5 tablets (15 mg total) by mouth at bedtime. 90 tablet 1  . montelukast (SINGULAIR) 10 MG tablet TAKE 1 TABLET BY MOUTH AT BEDTIME. 30 tablet 2  . nicotine (NICODERM CQ - DOSED IN MG/24 HOURS) 21 mg/24hr patch PLACE 1 PATCH (21 MG TOTAL) ONTO THE SKIN DAILY. 28 patch 1  . omeprazole (PRILOSEC) 40 MG capsule Take 1 capsule (40 mg total) by mouth daily. 30 capsule 4  . ondansetron (ZOFRAN) 4 MG tablet Take 1 tablet (4 mg total) every 8 (eight) hours as needed by mouth  for nausea or vomiting. 20 tablet 0  . ranitidine (ZANTAC) 150 MG tablet Take 1 tablet (150 mg total) by mouth 2 (two) times daily. 60 tablet 3  . Sodium Chloride-Sodium Bicarb (NETI POT SINUS WASH) 2300-700 MG KIT Do nasal rinse three times a week as needed 1 each 0  . triamcinolone cream (KENALOG) 0.1 % APPLY 1 APPLICATION TOPICALLY 2 TIMES DAILY. 30 g 1  . TRUEPLUS LANCETS 28G MISC 28 g by Does not apply route QID. 120 each 2  . umeclidinium bromide (INCRUSE ELLIPTA) 62.5 MCG/INH AEPB Inhale 1 puff into the lungs daily. 1 each 6   No current facility-administered medications for this visit.      Musculoskeletal: Strength & Muscle Tone: within normal limits Gait & Station: normal Patient leans: N/A  Psychiatric Specialty Exam: ROS  There were no vitals taken for this visit.There is no height or weight on file to calculate BMI.  General Appearance: Casual and Fairly Groomed  Eye Contact:  Fair  Speech:  Clear and Coherent and Normal Rate  Volume:  Normal  Mood:  Anxious and Depressed  Affect:  Appropriate and Congruent  Thought Process:  Goal Directed and Descriptions of Associations: Intact  Orientation:  Full (Time, Place, and Person)  Thought Content: Logical   Suicidal Thoughts:  No  Homicidal Thoughts:  No  Memory:  Immediate;   Good  Judgement:  Fair  Insight:  Fair  Psychomotor Activity:  Normal  Concentration:  Concentration: Fair  Recall:  [place] of Knowledge: Fair  Language: Fair  Akathisia:  Negative  Handed:  Right  AIMS (if indicated): not done  Assets:  Communication Skills Desire for Improvement Housing  ADL's:  Intact  Cognition: WNL  Sleep:  Poor   Screenings: AUDIT     Admission (Discharged) from 07/10/2013 in [facility]  Alcohol Use Disorder Identification Test Final Score (AUDIT)  12    GAD-7     Office Visit from 12/25/2017 in [facility] Counselor from 12/22/2017 in [facility] Office Visit from 09/18/2017 in [facility] Visit from 04/04/2017 in [facility] Visit from 03/13/2017 in [facility]  Total GAD-7 Score  _0 PHQ2-9     Office Visit from 12/25/2017 in [facility] Counselor from 12/22/2017 in [place] [facility] Office Visit from 09/18/2017 in [facility] Visit from 05/28/2017 in [facility]  Office Visit from 04/04/2017 in [facility]  PHQ-2 Total Score  _1 PHQ-9 Total Score  _2 Assessment and Plan:   At this time the patient is doing well.  Her major problem is that of generalized anxiety disorder.  She is responded well to Lexapro 20 mg and BuSpar 15 mg twice daily.  Her second problem is insomnia.  The patient is doing better taking doxepin as prescribed.  She also takes Remeron which I believe is probably also for its sedating effects.  I think it probably assists in her insomnia.  Overall emotionally the patient is quite stable.  Her 2 problems are well controlled.  I think the use  of therapy is been excellent for this patient.  She will return to see me in 4 months.  1. Anxiety and depression   2. Depression, unspecified depression type   3. GAD (generalized anxiety disorder)     Status of current problems: new to [employer] Ordered: No orders of the defined types were placed in this encounter.   Labs Reviewed: na  Collateral Obtained/Records Reviewed: Reviewed notes from Dr. Adele Schilder  Plan:  Increase Lexapro to 20 mg Increase BuSpar to 15 mg 3 times daily Seroquel discontinued by patient given  restless legs and leg cramps Initiate Remeron 15 mg nightly Continue individual therapy and palliative/hospice grief counselor   Jerral Ralph, MD 01/27/2019, 2:10 PM

## 2019-02-03 ENCOUNTER — Ambulatory Visit (INDEPENDENT_AMBULATORY_CARE_PROVIDER_SITE_OTHER): Payer: Medicaid Other | Admitting: Licensed Clinical Social Worker

## 2019-02-03 ENCOUNTER — Encounter (HOSPITAL_COMMUNITY): Payer: Self-pay | Admitting: Licensed Clinical Social Worker

## 2019-02-03 ENCOUNTER — Other Ambulatory Visit: Payer: Self-pay

## 2019-02-03 DIAGNOSIS — F32A Depression, unspecified: Secondary | ICD-10-CM

## 2019-02-03 DIAGNOSIS — F411 Generalized anxiety disorder: Secondary | ICD-10-CM

## 2019-02-03 DIAGNOSIS — F329 Major depressive disorder, single episode, unspecified: Secondary | ICD-10-CM

## 2019-02-03 NOTE — Progress Notes (Signed)
Virtual Visit via Phone Note  I connected with [name] [name] on 02/03/19 at 3:30pm by phone enabled telemedicine application and verified that I am speaking with the correct person using two identifiers. Pt does not have a computer or internet.   I discussed the limitations of evaluation and management by telemedicine and the availability of in person appointments. The patient expressed understanding and agreed to proceed.  History of Present Illness: Pt was referred to OP therapy by Psychiatrist, Dr. Casimiro Needle, for her anxiety, depression and Bipolar disorder.     Observations/Objective:  Pt presents for her therapy session by phone today. Upon presentation pt is screaming and crying in the phone and cannot be understood. Pt eventually calmed down and explained she was being evicted from her sr living facility due to her dog continuous barking and her continuing to break the rules. Discussed options. She will speak with her daughter tonight.The pt admitted she had been drinking, even though she denied it in the past. Pt admitted she was drinking during the session. Ended the session and will call her back to make a new appointment.         Assessment and Plan: Pt continues to want weekly therapy by phone. She does not want to come into the office for therapy.   Follow Up Instructions:   I discussed the assessment and treatment plan with the patient. The patient was provided an opportunity to ask questions and all were answered. The patient agreed with the plan and demonstrated an understanding of the instructions.   The patient was advised to call back or seek an in-person evaluation if the symptoms worsen or if the condition fails to improve as anticipated.  I provided 25 minutes of non-face-to-face time during this encounter.   [name] [name], LCAS

## 2019-02-08 ENCOUNTER — Other Ambulatory Visit: Payer: Self-pay | Admitting: Internal Medicine

## 2019-02-08 DIAGNOSIS — F329 Major depressive disorder, single episode, unspecified: Secondary | ICD-10-CM

## 2019-02-08 DIAGNOSIS — E039 Hypothyroidism, unspecified: Secondary | ICD-10-CM

## 2019-02-08 DIAGNOSIS — F419 Anxiety disorder, unspecified: Secondary | ICD-10-CM

## 2019-02-08 MED FILL — ?LABETALOL HCL 100 MG TABS: 100 | 30 days supply | Qty: 60 | Fill #1

## 2019-02-08 MED FILL — MONTELUKAST SOD 10 MG TAB: 10 | 30 days supply | Qty: 30 | Fill #1

## 2019-02-08 MED FILL — FUROSEMIDE 20 MG TABS: 20 | 30 days supply | Qty: 30 | Fill #1

## 2019-02-08 MED FILL — OMEPRAZOLE DR 40 MG CAPSULE: 40 | 30 days supply | Qty: 30 | Fill #4

## 2019-02-08 MED FILL — CYCLOBENZAPRINE 10 MG TAB: 10 | 10 days supply | Qty: 30 | Fill #1

## 2019-02-09 MED FILL — LEVOTHYROXINE 100 MCG TAB: 100 | 30 days supply | Qty: 30 | Fill #0

## 2019-02-10 MED FILL — BRILINTA 90 MG TABLET: 90 | 30 days supply | Qty: 60 | Fill #4

## 2019-02-10 MED FILL — $INCRUSE ELLIPTA 62.5 MCG I: 62.5 MCG | 90 days supply | Qty: 90 | Fill #1

## 2019-02-15 ENCOUNTER — Ambulatory Visit (INDEPENDENT_AMBULATORY_CARE_PROVIDER_SITE_OTHER): Payer: Self-pay | Admitting: Licensed Clinical Social Worker

## 2019-02-15 ENCOUNTER — Encounter (HOSPITAL_COMMUNITY): Payer: Self-pay | Admitting: Licensed Clinical Social Worker

## 2019-02-15 ENCOUNTER — Other Ambulatory Visit: Payer: Self-pay

## 2019-02-15 DIAGNOSIS — F319 Bipolar disorder, unspecified: Secondary | ICD-10-CM

## 2019-02-15 DIAGNOSIS — F411 Generalized anxiety disorder: Secondary | ICD-10-CM

## 2019-02-15 NOTE — Progress Notes (Signed)
Virtual Visit via Phone Note  I connected with [name] [name] on 02/15/19 at 4:00pm by phone enabled telemedicine application and verified that I am speaking with the correct person using two identifiers. Pt does not have a computer or internet.   I discussed the limitations of evaluation and management by telemedicine and the availability of in person appointments. The patient expressed understanding and agreed to proceed.  History of Present Illness: Pt was referred to OP therapy by Psychiatrist, Dr. Casimiro Needle, for her anxiety, depression and Bipolar disorder.     Observations/Objective:  Pt presents for her therapy session by phone today. Pt discussed her psychiatric symptoms and current life events. Pt discussed her last session. Processed with pt her behavior in the last session, the consequences of her behavior, including her drinking. After session, pt went and "cursed" out the person she thinks had her evicted. Coached pt on who is responsible for her eviction - herself. Pt was able to listen to the coaching. Pt has to move out by 8/27. Her daughter is assisting her with housing. Discussed humility with pt and the need to be the bigger person. Pt was receptive to suggestions.     Assessment and Plan: Pt continues to want weekly therapy by phone. She does not want to come into the office for therapy.   Follow Up Instructions:   I discussed the assessment and treatment plan with the patient. The patient was provided an opportunity to ask questions and all were answered. The patient agreed with the plan and demonstrated an understanding of the instructions.   The patient was advised to call back or seek an in-person evaluation if the symptoms worsen or if the condition fails to improve as anticipated.  I provided 35 minutes of non-face-to-face time during this encounter.   Dennie Vecchio S, LCAS

## 2019-02-18 ENCOUNTER — Other Ambulatory Visit: Payer: Self-pay | Admitting: Internal Medicine

## 2019-02-18 ENCOUNTER — Other Ambulatory Visit: Payer: Self-pay | Admitting: Critical Care Medicine

## 2019-02-18 MED FILL — $INCRUSE ELLIPTA 62.5 MCG I: 62.5 MCG | 90 days supply | Qty: 90 | Fill #1

## 2019-02-18 MED FILL — FLUTICASONE PROP 50 MCG SPR: 50 | 30 days supply | Qty: 16 | Fill #3

## 2019-02-18 MED FILL — ?PREDNISONE 10 MG TABLET: 10 | 5 days supply | Qty: 20 | Fill #0

## 2019-02-19 ENCOUNTER — Other Ambulatory Visit: Payer: Self-pay | Admitting: Internal Medicine

## 2019-02-19 DIAGNOSIS — I10 Essential (primary) hypertension: Secondary | ICD-10-CM

## 2019-02-19 MED FILL — IPRAT-ALBUT 0.5-3(2.5) MG/3: 0.5-2.5 (3) | 14 days supply | Qty: 180 | Fill #0

## 2019-02-19 MED FILL — $VENTOLIN HFA 18G INHALER: 108 (90 BAS | 25 days supply | Qty: 18 | Fill #0

## 2019-02-19 MED FILL — LOSARTAN POTASSIUM 50 MG TA: 50 | 30 days supply | Qty: 45 | Fill #0

## 2019-02-23 ENCOUNTER — Ambulatory Visit (HOSPITAL_COMMUNITY): Payer: Medicaid Other | Admitting: Licensed Clinical Social Worker

## 2019-02-23 ENCOUNTER — Other Ambulatory Visit: Payer: Self-pay

## 2019-02-23 ENCOUNTER — Telehealth (HOSPITAL_COMMUNITY): Payer: Self-pay | Admitting: Licensed Clinical Social Worker

## 2019-02-23 NOTE — Telephone Encounter (Signed)
Pt did not answer the phone for her phone enabled virtual therapy session. Her voicemail is not set up. Alver Fisher, LCAS

## 2019-03-02 ENCOUNTER — Ambulatory Visit (INDEPENDENT_AMBULATORY_CARE_PROVIDER_SITE_OTHER): Payer: Medicaid Other | Admitting: Licensed Clinical Social Worker

## 2019-03-02 ENCOUNTER — Other Ambulatory Visit: Payer: Self-pay

## 2019-03-02 ENCOUNTER — Encounter (HOSPITAL_COMMUNITY): Payer: Self-pay | Admitting: Licensed Clinical Social Worker

## 2019-03-02 DIAGNOSIS — F411 Generalized anxiety disorder: Secondary | ICD-10-CM

## 2019-03-02 NOTE — Progress Notes (Signed)
Virtual Visit via Phone Note  I connected with [name] [name] on 03/02/19 at 11:00am by phone enabled telemedicine application and verified that I am speaking with the correct person using two identifiers. Pt does not have a computer or internet.   I discussed the limitations of evaluation and management by telemedicine and the availability of in person appointments. The patient expressed understanding and agreed to proceed.  History of Present Illness: Pt was referred to OP therapy by Psychiatrist, Dr. Casimiro Needle, for her anxiety, depression and Bipolar disorder.     Observations/Objective:  Pt presents for her therapy session by phone today. Pt discussed her psychiatric symptoms and current life events. Pt has been evicted from her living situation due to her behavior and her barking dog. She is in the process of packing and moving to her daughter's house temporarily. Coached pt on her behavior for the last 2 weeks in her current living situation, "being the bigger person, humility." Processed with pt her fears of moving in temporarily with her daughter: expectations, rules, gratitude.   Assessment and Plan: Pt continues to want weekly therapy by phone. She does not want to come into the office for therapy.   Follow Up Instructions:   I discussed the assessment and treatment plan with the patient. The patient was provided an opportunity to ask questions and all were answered. The patient agreed with the plan and demonstrated an understanding of the instructions.   The patient was advised to call back or seek an in-person evaluation if the symptoms worsen or if the condition fails to improve as anticipated.  I provided 35 minutes of non-face-to-face time during this encounter.   Jasper Ruminski S, LCAS

## 2019-03-15 ENCOUNTER — Other Ambulatory Visit: Payer: Self-pay | Admitting: Internal Medicine

## 2019-03-15 DIAGNOSIS — K219 Gastro-esophageal reflux disease without esophagitis: Secondary | ICD-10-CM

## 2019-03-15 DIAGNOSIS — F419 Anxiety disorder, unspecified: Secondary | ICD-10-CM

## 2019-03-15 DIAGNOSIS — F329 Major depressive disorder, single episode, unspecified: Secondary | ICD-10-CM

## 2019-03-15 DIAGNOSIS — I10 Essential (primary) hypertension: Secondary | ICD-10-CM

## 2019-03-15 MED FILL — LABETALOL HCL 100 MG TABS: 100 | 30 days supply | Qty: 60 | Fill #2

## 2019-03-15 MED FILL — MONTELUKAST SOD 10 MG TAB: 10 | 30 days supply | Qty: 30 | Fill #2

## 2019-03-15 MED FILL — LEVOTHYROXINE 100 MCG TAB: 100 | 30 days supply | Qty: 30 | Fill #1

## 2019-03-15 MED FILL — FUROSEMIDE 20 MG TABS: 20 | 30 days supply | Qty: 30 | Fill #2

## 2019-03-16 ENCOUNTER — Other Ambulatory Visit: Payer: Self-pay | Admitting: Internal Medicine

## 2019-03-16 DIAGNOSIS — F329 Major depressive disorder, single episode, unspecified: Secondary | ICD-10-CM

## 2019-03-16 DIAGNOSIS — F419 Anxiety disorder, unspecified: Secondary | ICD-10-CM

## 2019-03-16 MED FILL — LOSARTAN POTASSIUM 50 MG TA: 50 | 30 days supply | Qty: 45 | Fill #0

## 2019-03-16 MED FILL — CYCLOBENZAPRINE 10 MG TAB: 10 | 10 days supply | Qty: 30 | Fill #0

## 2019-03-16 MED FILL — OMEPRAZOLE DR 40 MG CAPSULE: 40 | 30 days supply | Qty: 30 | Fill #0

## 2019-03-17 ENCOUNTER — Telehealth: Payer: Self-pay | Admitting: Internal Medicine

## 2019-03-17 ENCOUNTER — Encounter (HOSPITAL_COMMUNITY): Payer: Self-pay | Admitting: Licensed Clinical Social Worker

## 2019-03-17 ENCOUNTER — Ambulatory Visit (INDEPENDENT_AMBULATORY_CARE_PROVIDER_SITE_OTHER): Payer: Medicaid Other | Admitting: Licensed Clinical Social Worker

## 2019-03-17 ENCOUNTER — Other Ambulatory Visit: Payer: Self-pay

## 2019-03-17 DIAGNOSIS — F411 Generalized anxiety disorder: Secondary | ICD-10-CM

## 2019-03-17 MED FILL — ESCITALOPRAM 20 MG TABLET: 20 | 30 days supply | Qty: 30 | Fill #0

## 2019-03-17 MED FILL — !BRILINTA 90 MG TABLET: 30 days supply | Qty: 60 | Fill #5

## 2019-03-17 NOTE — Telephone Encounter (Signed)
Refills are at [facility] on [address]. Spoke to pt; she will transfer them here.

## 2019-03-17 NOTE — Progress Notes (Signed)
Virtual Visit via Phone Note  I connected with [name] [name] on 03/17/19 at 11:15am by phone enabled telemedicine application and verified that I am speaking with the correct person using two identifiers. Pt does not have a computer or internet.   I discussed the limitations of evaluation and management by telemedicine and the availability of in person appointments. The patient expressed understanding and agreed to proceed.  History of Present Illness: Pt was referred to OP therapy by Psychiatrist, Dr. Casimiro Needle, for her anxiety, depression and Bipolar disorder.     Observations/Objective:  Pt presents for her therapy session by phone today. Pt discussed her psychiatric symptoms and current life events. Pt reports she has temporarily  moved in with her daughter and her family until her new apartment becomes available 10/1. Used socratic questions to discuss her current relationship with her daughter. Unlike her old relationship when she was in active addiction. Discussed her feelings upon her eviction vs her current feelings of hope. Asked open ended questions about her previous coping skills  - flight- go back to using alcohol and drugs. Validated her current positive thoughts. Again, discussed gratitudes with pt.        Assessment and Plan: Pt continues to want weekly therapy by phone. She does not want to come into the office for therapy.   Follow Up Instructions:   I discussed the assessment and treatment plan with the patient. The patient was provided an opportunity to ask questions and all were answered. The patient agreed with the plan and demonstrated an understanding of the instructions.   The patient was advised to call back or seek an in-person evaluation if the symptoms worsen or if the condition fails to improve as anticipated.  I provided  40 minutes of non-face-to-face time during this encounter.   [name] [name], LCAS

## 2019-03-17 NOTE — Telephone Encounter (Signed)
New Message   1) Medication(s) Requested (by name): escitalopram (LEXAPRO) 20 MG tablet  2) Pharmacy of Choice: [facility]  3) Special Requests: Pt is requesting a refill until her appt on 9/10   Approved medications will be sent to the pharmacy, we will reach out if there is an issue.  Requests made after 3pm may not be addressed until the following business day!  If a patient is unsure of the name of the medication(s) please note and ask patient to call back when they are able to provide all info, do not send to responsible party until all information is available!

## 2019-03-19 MED FILL — $BREO ELLIPTA 200-25 MCG IN: 200-25 | 30 days supply | Qty: 60 | Fill #2

## 2019-03-25 ENCOUNTER — Ambulatory Visit: Payer: Self-pay | Attending: Family Medicine | Admitting: Physician Assistant

## 2019-03-25 ENCOUNTER — Other Ambulatory Visit: Payer: Self-pay

## 2019-03-25 DIAGNOSIS — R1310 Dysphagia, unspecified: Secondary | ICD-10-CM

## 2019-03-25 DIAGNOSIS — J449 Chronic obstructive pulmonary disease, unspecified: Secondary | ICD-10-CM

## 2019-03-25 DIAGNOSIS — K219 Gastro-esophageal reflux disease without esophagitis: Secondary | ICD-10-CM

## 2019-03-25 DIAGNOSIS — I10 Essential (primary) hypertension: Secondary | ICD-10-CM

## 2019-03-25 DIAGNOSIS — I252 Old myocardial infarction: Secondary | ICD-10-CM

## 2019-03-25 DIAGNOSIS — R739 Hyperglycemia, unspecified: Secondary | ICD-10-CM

## 2019-03-25 DIAGNOSIS — F411 Generalized anxiety disorder: Secondary | ICD-10-CM

## 2019-03-25 DIAGNOSIS — E785 Hyperlipidemia, unspecified: Secondary | ICD-10-CM

## 2019-03-25 DIAGNOSIS — E039 Hypothyroidism, unspecified: Secondary | ICD-10-CM

## 2019-03-25 MED ORDER — LEVOTHYROXINE SODIUM 100 MCG PO TABS: 100.0000 ug | ORAL_TABLET | Freq: Every day | ORAL | 2 refills | Status: DC

## 2019-03-25 MED ORDER — BREO ELLIPTA 200-25 MCG/INH IN AEPB: 1.0000 | INHALATION_SPRAY | Freq: Every day | RESPIRATORY_TRACT | 6 refills | Status: DC

## 2019-03-25 MED ORDER — INCRUSE ELLIPTA 62.5 MCG/INH IN AEPB: 1.0000 | INHALATION_SPRAY | Freq: Every day | RESPIRATORY_TRACT | 6 refills | Status: DC

## 2019-03-25 MED ORDER — OMEPRAZOLE 40 MG PO CPDR: 40.0000 mg | DELAYED_RELEASE_CAPSULE | Freq: Every day | ORAL | 1 refills | Status: DC

## 2019-03-25 MED ORDER — LOSARTAN POTASSIUM 50 MG PO TABS: 75.0000 mg | ORAL_TABLET | Freq: Every day | ORAL | 3 refills | Status: DC

## 2019-03-25 MED ORDER — ATORVASTATIN CALCIUM 40 MG PO TABS: 40.0000 mg | ORAL_TABLET | Freq: Every day | ORAL | 3 refills | Status: DC

## 2019-03-25 MED ORDER — IPRATROPIUM-ALBUTEROL 0.5-2.5 (3) MG/3ML IN SOLN: RESPIRATORY_TRACT | 1 refills | Status: DC

## 2019-03-25 MED ORDER — GABAPENTIN 600 MG PO TABS: ORAL_TABLET | ORAL | 3 refills | Status: DC

## 2019-03-25 MED ORDER — LABETALOL HCL 100 MG PO TABS: ORAL_TABLET | ORAL | 2 refills | Status: DC

## 2019-03-25 MED ORDER — TICAGRELOR 90 MG PO TABS: ORAL_TABLET | ORAL | 6 refills | Status: DC

## 2019-03-25 MED ORDER — FUROSEMIDE 20 MG PO TABS: 20.0000 mg | ORAL_TABLET | Freq: Every day | ORAL | 2 refills | Status: DC

## 2019-03-25 MED ORDER — ALBUTEROL SULFATE HFA 108 (90 BASE) MCG/ACT IN AERS: INHALATION_SPRAY | RESPIRATORY_TRACT | 2 refills | Status: DC

## 2019-03-25 MED FILL — GABAPENTIN 600 MG TABLET: 600 | 30 days supply | Qty: 120 | Fill #0

## 2019-03-25 MED FILL — $VENTOLIN HFA 18G INHALER: 108 (90 BAS | 25 days supply | Qty: 18 | Fill #0

## 2019-03-25 MED FILL — IPRAT-ALBUT 0.5-3(2.5) MG/3: 0.5-2.5 (3) | 30 days supply | Qty: 360 | Fill #0

## 2019-03-25 NOTE — Progress Notes (Signed)
Virtual Visit via Telephone Note  I connected with [name] [name] on 03/25/19 at 10:10 AM EDT by telephone and verified that I am speaking with the correct person using two identifiers.   I discussed the limitations, risks, security and privacy concerns of performing an evaluation and management service by telephone and the availability of in person appointments. I also discussed with the patient that there may be a patient responsible charge related to this service. The patient expressed understanding and agreed to proceed.  Patient location:  home My Location:  [place] office Persons on the call:  Me and the patient   History of Present Illness:  Patient calling and needs RF of meds.  Sees psychiatrist for psych meds.  doing well other than difficulty swallowing and feels like she is choking at times with eating.  She has a fh of esophageal stricture and is concerned about having this.  No weight loss, in fact, she c/o weight gain.  No f/c.  No N/V/D.  No melena or hematochezia.  No early satiety.    Observations/Objective:  A&Ox3   Assessment and Plan: 1. Dysphagia, unspecified type - Ambulatory referral to Gastroenterology  2. Gastroesophageal reflux disease without esophagitis - Ambulatory referral to Gastroenterology - omeprazole (PRILOSEC) 40 MG capsule; Take 1 capsule (40 mg total) by mouth daily.  Dispense: 30 capsule; Refill: 1  3. Essential hypertension - Comprehensive metabolic panel; Future - CBC with Differential/Platelet; Future - losartan (COZAAR) 50 MG tablet; Take 1.5 tablets (75 mg total) by mouth daily. Needs appointment with PCP.  Dispense: 45 tablet; Refill: 3 - labetalol (NORMODYNE) 100 MG tablet; TAKE 1 TABLET BY MOUTH 2 (TWO) TIMES DAILY.  Dispense: 60 tablet; Refill: 2 - furosemide (LASIX) 20 MG tablet; Take 1 tablet (20 mg total) by mouth daily.  Dispense: 30 tablet; Refill: 2 -lab appt 9/21  4. Acquired hypothyroidism - Thyroid Panel With TSH; Future -  levothyroxine (SYNTHROID) 100 MCG tablet; Take 1 tablet (100 mcg total) by mouth daily.  Dispense: 30 tablet; Refill: 2  5. COPD with chronic bronchitis ([place]) - ipratropium-albuterol (DUONEB) 0.5-2.5 (3) MG/3ML SOLN; TAKE 3 MLS VIA NEBULIZATION EVERY 6 HOURS  Dispense: 360 mL; Refill: 1 - fluticasone furoate-vilanterol (BREO ELLIPTA) 200-25 MCG/INH AEPB; Inhale 1 puff into the lungs daily.  Dispense: 60 each; Refill: 6 - albuterol (VENTOLIN HFA) 108 (90 Base) MCG/ACT inhaler; INHALE 2 PUFFS INTO THE LUNGS EVERY 6 (SIX) HOURS AS NEEDED FOR WHEEZING OR SHORTNESS OF BREATH.  Dispense: 18 g; Refill: 2  6. GAD (generalized anxiety disorder) Follow with psych.  Stable currently  7. Hyperlipidemia, unspecified hyperlipidemia type - Lipid panel; Future - atorvastatin (LIPITOR) 40 MG tablet; Take 1 tablet (40 mg total) by mouth at bedtime.  Dispense: 30 tablet; Refill: 3  8. Hyperglycemia I have had a lengthy discussion and provided education about insulin resistance and the intake of too much sugar/refined carbohydrates.  I have advised the patient to work at a goal of eliminating sugary drinks, candy, desserts, sweets, refined sugars, processed foods, and white carbohydrates.  The patient expresses understanding.  - Hemoglobin A1c; Future  9. H/O acute myocardial infarction - ticagrelor (BRILINTA) 90 MG TABS tablet; TAKE 1 TABLET BY MOUTH 2 (TWO) TIMES DAILY.  Dispense: 60 tablet; Refill: 6    Follow Up Instructions: PCP in 3 months;  Sooner if needed   I discussed the assessment and treatment plan with the patient. The patient was provided an opportunity to ask questions and all were  answered. The patient agreed with the plan and demonstrated an understanding of the instructions.   The patient was advised to call back or seek an in-person evaluation if the symptoms worsen or if the condition fails to improve as anticipated.  I provided 14 minutes of non-face-to-face time during this  encounter.   Freeman Caldron, PA-C  Patient ID: [name] [name], female   DOB: Jan 27, 1956, 63 y.o.   MRN: [id number]

## 2019-03-26 ENCOUNTER — Other Ambulatory Visit: Payer: Self-pay | Admitting: Internal Medicine

## 2019-03-26 DIAGNOSIS — F17200 Nicotine dependence, unspecified, uncomplicated: Secondary | ICD-10-CM

## 2019-03-26 MED FILL — NICOTINE 21 MG/24HR PATCH: 21 | 28 days supply | Qty: 28 | Fill #0

## 2019-03-29 ENCOUNTER — Encounter: Payer: Self-pay | Admitting: Acute Care

## 2019-03-29 ENCOUNTER — Other Ambulatory Visit: Payer: Self-pay

## 2019-03-29 ENCOUNTER — Other Ambulatory Visit: Payer: Medicaid Other | Admitting: Acute Care

## 2019-03-29 DIAGNOSIS — Z720 Tobacco use: Secondary | ICD-10-CM

## 2019-03-29 NOTE — Progress Notes (Signed)
Shared Decision-Making Visit for Lung Cancer Screening [id number] )  Lung Cancer Screening Criteria 18-44-years of age 63+ pack year smoking history No Recent History of coughing up blood   No Unexplained weight loss of > 15 pounds in the last 6 months. No Prior History Lung / other cancer  (Diagnosis within the last 5 years already requiring surveillance chest CT Scans). Pt is a current smoker, or former smoker who has quit within the last 15 years.  This patient meets the criterial noted above and is asymptomatic for any signs or symptoms of lung cancer.  The Shared Decision-Making Visit discussion included risks and benefits of screening, potential for follow up diagnostic testing for abnormal scans, potential for false positive tests, over diagnosis, and discussion about total radiation exposure. Patient stated willingness to undergo diagnostics and treatment as needed. Current smokers were counseled on smoking cessation as the single most powerful action they can take to decrease their risk of lung cancer, pulmonary disease, heart disease and stroke. They were given a resource card with information on receiving free nicotine replacement therapy , and information about free smoking cessation classes.  Pt understands that this scan is being paid for by a grant obtained by the [facility]. We discussed  that there is no guarantee of additional grant money from year to year as these grants are not guaranteed to programs on an annual basis.They have to be applied for and they are awarded based on county need, and utilization of the previous years funds.  Pt was given a smoking cessation card. Blue Card #1   Magdalen Spatz, AGACNP-BC [place] Pager # [phone]

## 2019-03-30 ENCOUNTER — Telehealth (HOSPITAL_COMMUNITY): Payer: Self-pay

## 2019-03-30 NOTE — Telephone Encounter (Signed)
Patient called and requested her medications she receives from doctor be transferred from [place] to [facility]. It's been taken care of.

## 2019-03-31 MED FILL — busPIRone HCL 15 MG TABS: 15 | 30 days supply | Qty: 90 | Fill #0

## 2019-03-31 MED FILL — DOXEPIN 50 MG CAPSULE: 50 | 30 days supply | Qty: 30 | Fill #0

## 2019-04-02 ENCOUNTER — Other Ambulatory Visit: Payer: Self-pay | Admitting: *Deleted

## 2019-04-02 DIAGNOSIS — F17210 Nicotine dependence, cigarettes, uncomplicated: Secondary | ICD-10-CM

## 2019-04-02 DIAGNOSIS — Z122 Encounter for screening for malignant neoplasm of respiratory organs: Secondary | ICD-10-CM

## 2019-04-05 ENCOUNTER — Other Ambulatory Visit: Payer: Self-pay

## 2019-04-05 ENCOUNTER — Ambulatory Visit (HOSPITAL_BASED_OUTPATIENT_CLINIC_OR_DEPARTMENT_OTHER): Payer: Self-pay | Admitting: Pharmacist

## 2019-04-05 ENCOUNTER — Ambulatory Visit: Payer: Self-pay | Attending: Internal Medicine

## 2019-04-05 DIAGNOSIS — I10 Essential (primary) hypertension: Secondary | ICD-10-CM

## 2019-04-05 DIAGNOSIS — Z122 Encounter for screening for malignant neoplasm of respiratory organs: Secondary | ICD-10-CM

## 2019-04-05 DIAGNOSIS — R739 Hyperglycemia, unspecified: Secondary | ICD-10-CM

## 2019-04-05 DIAGNOSIS — Z23 Encounter for immunization: Secondary | ICD-10-CM

## 2019-04-05 DIAGNOSIS — E785 Hyperlipidemia, unspecified: Secondary | ICD-10-CM

## 2019-04-05 DIAGNOSIS — F17210 Nicotine dependence, cigarettes, uncomplicated: Secondary | ICD-10-CM

## 2019-04-05 DIAGNOSIS — E039 Hypothyroidism, unspecified: Secondary | ICD-10-CM

## 2019-04-05 NOTE — Progress Notes (Signed)
Patient presents for vaccination against influenza per orders of Dr. Johnson. Consent given. Counseling provided. No contraindications exists. Vaccine administered without incident.   

## 2019-04-06 ENCOUNTER — Other Ambulatory Visit: Payer: Self-pay | Admitting: Physician Assistant

## 2019-04-06 DIAGNOSIS — E1165 Type 2 diabetes mellitus with hyperglycemia: Secondary | ICD-10-CM

## 2019-04-06 LAB — CBC WITH DIFFERENTIAL/PLATELET
Basophils Absolute: 0.1 10*3/uL (ref 0.0–0.2)
Basos: 1 %
EOS (ABSOLUTE): 0.3 10*3/uL (ref 0.0–0.4)
Eos: 3 %
Hematocrit: 39.4 % (ref 34.0–46.6)
Hemoglobin: 12.6 g/dL (ref 11.1–15.9)
Immature Grans (Abs): 0 10*3/uL (ref 0.0–0.1)
Immature Granulocytes: 0 %
Lymphocytes Absolute: 2.6 10*3/uL (ref 0.7–3.1)
Lymphs: 26 %
MCH: 29.1 pg (ref 26.6–33.0)
MCHC: 32 g/dL (ref 31.5–35.7)
MCV: 91 fL (ref 79–97)
Monocytes Absolute: 1.2 10*3/uL — ABNORMAL HIGH (ref 0.1–0.9)
Monocytes: 12 %
Neutrophils Absolute: 5.7 10*3/uL (ref 1.4–7.0)
Neutrophils: 58 %
Platelets: 501 10*3/uL — ABNORMAL HIGH (ref 150–450)
RBC: 4.33 x10E6/uL (ref 3.77–5.28)
RDW: 14.8 % (ref 11.7–15.4)
WBC: 9.9 10*3/uL (ref 3.4–10.8)

## 2019-04-06 LAB — THYROID PANEL WITH TSH
Free Thyroxine Index: 2.1 (ref 1.2–4.9)
T3 Uptake Ratio: 28 % (ref 24–39)
T4, Total: 7.5 ug/dL (ref 4.5–12.0)
TSH: 0.352 u[IU]/mL — ABNORMAL LOW (ref 0.450–4.500)

## 2019-04-06 LAB — COMPREHENSIVE METABOLIC PANEL
ALT: 21 IU/L (ref 0–32)
AST: 17 IU/L (ref 0–40)
Albumin/Globulin Ratio: 1.6 (ref 1.2–2.2)
Albumin: 3.9 g/dL (ref 3.8–4.8)
Alkaline Phosphatase: 107 IU/L (ref 39–117)
BUN/Creatinine Ratio: 26 (ref 12–28)
BUN: 24 mg/dL (ref 8–27)
Bilirubin Total: 0.3 mg/dL (ref 0.0–1.2)
CO2: 26 mmol/L (ref 20–29)
Calcium: 9.6 mg/dL (ref 8.7–10.3)
Chloride: 102 mmol/L (ref 96–106)
Creatinine, Ser: 0.92 mg/dL (ref 0.57–1.00)
GFR calc Af Amer: 77 mL/min/{1.73_m2} (ref >59)
GFR calc non Af Amer: 67 mL/min/{1.73_m2} (ref >59)
Globulin, Total: 2.5 g/dL (ref 1.5–4.5)
Glucose: 79 mg/dL (ref 65–99)
Potassium: 5 mmol/L (ref 3.5–5.2)
Sodium: 140 mmol/L (ref 134–144)
Total Protein: 6.4 g/dL (ref 6.0–8.5)

## 2019-04-06 LAB — LIPID PANEL
Chol/HDL Ratio: 2.8 ratio (ref 0.0–4.4)
Cholesterol, Total: 185 mg/dL (ref 100–199)
HDL: 67 mg/dL (ref >39)
LDL Chol Calc (NIH): 99 mg/dL (ref 0–99)
Triglycerides: 104 mg/dL (ref 0–149)
VLDL Cholesterol Cal: 19 mg/dL (ref 5–40)

## 2019-04-06 LAB — HEMOGLOBIN A1C
Est. average glucose Bld gHb Est-mCnc: 128 mg/dL
Hgb A1c MFr Bld: 6.1 % — ABNORMAL HIGH (ref 4.8–5.6)

## 2019-04-06 MED ORDER — METFORMIN HCL 500 MG PO TABS: 500.0000 mg | ORAL_TABLET | Freq: Two times a day (BID) | ORAL | 3 refills | Status: DC

## 2019-04-06 MED FILL — ?METFORMIN HCL 500MG TABLET: 500 | 30 days supply | Qty: 60 | Fill #0

## 2019-04-07 ENCOUNTER — Other Ambulatory Visit: Payer: Self-pay

## 2019-04-07 ENCOUNTER — Encounter (HOSPITAL_COMMUNITY): Payer: Self-pay | Admitting: Emergency Medicine

## 2019-04-07 ENCOUNTER — Ambulatory Visit (HOSPITAL_COMMUNITY)
Admission: EM | Admit: 2019-04-07 | Discharge: 2019-04-07 | Disposition: A | Discharge status: Home / Self Care | Payer: Medicaid Other | Attending: Family Medicine | Admitting: Family Medicine

## 2019-04-07 DIAGNOSIS — E119 Type 2 diabetes mellitus without complications: Secondary | ICD-10-CM | POA: Diagnosis not present

## 2019-04-07 DIAGNOSIS — E039 Hypothyroidism, unspecified: Secondary | ICD-10-CM | POA: Diagnosis not present

## 2019-04-07 DIAGNOSIS — R0602 Shortness of breath: Secondary | ICD-10-CM

## 2019-04-07 DIAGNOSIS — Z7984 Long term (current) use of oral hypoglycemic drugs: Secondary | ICD-10-CM | POA: Diagnosis not present

## 2019-04-07 DIAGNOSIS — R0981 Nasal congestion: Secondary | ICD-10-CM

## 2019-04-07 DIAGNOSIS — J3089 Other allergic rhinitis: Secondary | ICD-10-CM

## 2019-04-07 DIAGNOSIS — F419 Anxiety disorder, unspecified: Secondary | ICD-10-CM | POA: Insufficient documentation

## 2019-04-07 DIAGNOSIS — M1990 Unspecified osteoarthritis, unspecified site: Secondary | ICD-10-CM | POA: Insufficient documentation

## 2019-04-07 DIAGNOSIS — I10 Essential (primary) hypertension: Secondary | ICD-10-CM | POA: Insufficient documentation

## 2019-04-07 DIAGNOSIS — J441 Chronic obstructive pulmonary disease with (acute) exacerbation: Secondary | ICD-10-CM | POA: Diagnosis not present

## 2019-04-07 DIAGNOSIS — F329 Major depressive disorder, single episode, unspecified: Secondary | ICD-10-CM | POA: Insufficient documentation

## 2019-04-07 DIAGNOSIS — J449 Chronic obstructive pulmonary disease, unspecified: Secondary | ICD-10-CM | POA: Diagnosis not present

## 2019-04-07 DIAGNOSIS — J309 Allergic rhinitis, unspecified: Secondary | ICD-10-CM | POA: Insufficient documentation

## 2019-04-07 DIAGNOSIS — R0789 Other chest pain: Secondary | ICD-10-CM | POA: Diagnosis not present

## 2019-04-07 DIAGNOSIS — E785 Hyperlipidemia, unspecified: Secondary | ICD-10-CM | POA: Diagnosis not present

## 2019-04-07 DIAGNOSIS — I2510 Atherosclerotic heart disease of native coronary artery without angina pectoris: Secondary | ICD-10-CM | POA: Insufficient documentation

## 2019-04-07 DIAGNOSIS — Z20828 Contact with and (suspected) exposure to other viral communicable diseases: Secondary | ICD-10-CM | POA: Diagnosis not present

## 2019-04-07 DIAGNOSIS — K219 Gastro-esophageal reflux disease without esophagitis: Secondary | ICD-10-CM | POA: Insufficient documentation

## 2019-04-07 DIAGNOSIS — I252 Old myocardial infarction: Secondary | ICD-10-CM | POA: Diagnosis not present

## 2019-04-07 DIAGNOSIS — R070 Pain in throat: Secondary | ICD-10-CM

## 2019-04-07 DIAGNOSIS — Z955 Presence of coronary angioplasty implant and graft: Secondary | ICD-10-CM | POA: Diagnosis not present

## 2019-04-07 DIAGNOSIS — Z7982 Long term (current) use of aspirin: Secondary | ICD-10-CM | POA: Insufficient documentation

## 2019-04-07 DIAGNOSIS — R05 Cough: Secondary | ICD-10-CM | POA: Diagnosis not present

## 2019-04-07 MED ORDER — METHYLPREDNISOLONE ACETATE 80 MG/ML IJ SUSP
80.0000 mg | Freq: Once | INTRAMUSCULAR | Status: AC
  Administered 2019-04-07: 80 mg via INTRAMUSCULAR

## 2019-04-07 MED ORDER — METHYLPREDNISOLONE ACETATE 80 MG/ML IJ SUSP
INTRAMUSCULAR | Status: AC
  Filled 2019-04-07: qty 1

## 2019-04-07 MED ORDER — BENZONATATE 100 MG PO CAPS: 100.0000 mg | ORAL_CAPSULE | Freq: Three times a day (TID) | ORAL | 0 refills | Status: DC | PRN

## 2019-04-07 MED ORDER — PROMETHAZINE-DM 6.25-15 MG/5ML PO SYRP: 5.0000 mL | ORAL_SOLUTION | Freq: Every evening | ORAL | 0 refills | Status: DC | PRN

## 2019-04-07 MED FILL — PROMETHAZINE W/DM SYRUP: 6.25-15 | 20 days supply | Qty: 100 | Fill #0

## 2019-04-07 MED FILL — BENZONATATE 100 MG CAPS: 100 | 10 days supply | Qty: 30 | Fill #0

## 2019-04-07 NOTE — ED Notes (Signed)
Patient able to ambulate independently  

## 2019-04-07 NOTE — Discharge Instructions (Addendum)
For sore throat or cough try using a honey-based tea. Use 3 teaspoons of honey with juice squeezed from half lemon. Place shaved pieces of ginger into 1/2-1 cup of water and warm over stove top. Then mix the ingredients and repeat every 4 hours as needed. Please take Tylenol 500mg every 6 hours. Hydrate very well with at least 2 liters of water. Eat light meals such as soups to replenish electrolytes and soft fruits, veggies. Start an antihistamine like Zyrtec, Allegra or Claritin for postnasal drainage, sinus congestion.   °

## 2019-04-07 NOTE — ED Triage Notes (Signed)
Patient on phone .  Speaking in complete sentences noted

## 2019-04-07 NOTE — ED Provider Notes (Signed)
MRN: [id number] DOB: 09/26/1955  Subjective:   [name] [name] is a 63 y.o. female presenting for 2-day history of sinus congestion, runny nose, throat pain, shortness of breath.  Patient has history of chronic rhinitis, COPD.  She tries to maintain her Singulair, oral antihistamine and breathing treatments.  She states that she has felt some chest tightness but is helped by her breathing treatments.  Her primary concern is the congestion in her head and the throat pain is causing her.  She has also used Robitussin for an intermittent cough but is not coughing much.  Denies fever, sinus pain, ear pain, chest pain, nausea, vomiting, belly pain.  Of note, patient also had her flu shot on 04/05/2019 with her PCP.  She had a full panel of labs done and everything was normal.   No current facility-administered medications for this encounter.   Current Outpatient Medications:  .  albuterol (VENTOLIN HFA) 108 (90 Base) MCG/ACT inhaler, INHALE 2 PUFFS INTO THE LUNGS EVERY 6 (SIX) HOURS AS NEEDED FOR WHEEZING OR SHORTNESS OF BREATH., Disp: 18 g, Rfl: 2 .  aspirin EC 81 MG tablet, Take 81 mg by mouth daily., Disp: , Rfl:  .  atorvastatin (LIPITOR) 40 MG tablet, Take 1 tablet (40 mg total) by mouth at bedtime., Disp: 30 tablet, Rfl: 3 .  benzonatate (TESSALON) 100 MG capsule, Take 1 capsule (100 mg total) by mouth 3 (three) times daily as needed for cough., Disp: 30 capsule, Rfl: 0 .  Blood Glucose Monitoring Suppl (TRUE METRIX METER) DEVI, 1 kit by Does not apply route 4 (four) times daily., Disp: 1 Device, Rfl: 0 .  busPIRone (BUSPAR) 15 MG tablet, Take 1 tablet (15 mg total) by mouth 3 (three) times daily., Disp: 270 tablet, Rfl: 1 .  Calcium 600 MG tablet, Take 600 mg by mouth daily. , Disp: 60 tablet, Rfl:  .  cyclobenzaprine (FLEXERIL) 10 MG tablet, TAKE 1 TABLET BY MOUTH 3 TIMES DAILY AS NEEDED., Disp: 30 tablet, Rfl: 0 .  doxepin (SINEQUAN) 50 MG capsule, Take 1 capsule (50 mg total) by mouth at  bedtime., Disp: 30 capsule, Rfl: 2 .  escitalopram (LEXAPRO) 20 MG tablet, Take 1 tablet (20 mg total) by mouth daily., Disp: 30 tablet, Rfl: 2 .  fluticasone (FLONASE) 50 MCG/ACT nasal spray, Place 2 sprays into both nostrils daily., Disp: 16 g, Rfl: 6 .  fluticasone furoate-vilanterol (BREO ELLIPTA) 200-25 MCG/INH AEPB, Inhale 1 puff into the lungs daily., Disp: 60 each, Rfl: 6 .  furosemide (LASIX) 20 MG tablet, Take 1 tablet (20 mg total) by mouth daily., Disp: 30 tablet, Rfl: 2 .  gabapentin (NEURONTIN) 600 MG tablet, TAKE 1 TABLET BY MOUTH 4 TIMES DAILY, Disp: 120 tablet, Rfl: 3 .  glucose blood (TRUE METRIX BLOOD GLUCOSE TEST) test strip, Use as instructed, Disp: 100 each, Rfl: 12 .  ipratropium-albuterol (DUONEB) 0.5-2.5 (3) MG/3ML SOLN, TAKE 3 MLS VIA NEBULIZATION EVERY 6 HOURS, Disp: 360 mL, Rfl: 1 .  labetalol (NORMODYNE) 100 MG tablet, TAKE 1 TABLET BY MOUTH 2 (TWO) TIMES DAILY., Disp: 60 tablet, Rfl: 2 .  levothyroxine (SYNTHROID) 100 MCG tablet, Take 1 tablet (100 mcg total) by mouth daily., Disp: 30 tablet, Rfl: 2 .  loratadine (CLARITIN) 10 MG tablet, Take 1 tablet (10 mg total) by mouth daily., Disp: 30 tablet, Rfl: 11 .  losartan (COZAAR) 50 MG tablet, Take 1.5 tablets (75 mg total) by mouth daily. Needs appointment with PCP., Disp: 45 tablet, Rfl: 3 .  metFORMIN (GLUCOPHAGE) 500 MG tablet, Take 1 tablet (500 mg total) by mouth 2 (two) times daily with a meal., Disp: 180 tablet, Rfl: 3 .  mirtazapine (REMERON) 30 MG tablet, Take 0.5 tablets (15 mg total) by mouth at bedtime., Disp: 90 tablet, Rfl: 1 .  montelukast (SINGULAIR) 10 MG tablet, TAKE 1 TABLET BY MOUTH AT BEDTIME., Disp: 30 tablet, Rfl: 2 .  nicotine (NICODERM CQ - DOSED IN MG/24 HOURS) 21 mg/24hr patch, PLACE 1 PATCH (21 MG TOTAL) ONTO THE SKIN DAILY., Disp: 28 patch, Rfl: 1 .  omeprazole (PRILOSEC) 40 MG capsule, Take 1 capsule (40 mg total) by mouth daily., Disp: 30 capsule, Rfl: 1 .  ondansetron (ZOFRAN) 4 MG tablet,  Take 1 tablet (4 mg total) every 8 (eight) hours as needed by mouth for nausea or vomiting., Disp: 20 tablet, Rfl: 0 .  ranitidine (ZANTAC) 150 MG tablet, Take 1 tablet (150 mg total) by mouth 2 (two) times daily., Disp: 60 tablet, Rfl: 3 .  Sodium Chloride-Sodium Bicarb (NETI POT SINUS WASH) 2300-700 MG KIT, Do nasal rinse three times a week as needed, Disp: 1 each, Rfl: 0 .  ticagrelor (BRILINTA) 90 MG TABS tablet, TAKE 1 TABLET BY MOUTH 2 (TWO) TIMES DAILY., Disp: 60 tablet, Rfl: 6 .  triamcinolone cream (KENALOG) 0.1 %, APPLY 1 APPLICATION TOPICALLY 2 TIMES DAILY., Disp: 30 g, Rfl: 1 .  TRUEPLUS LANCETS 28G MISC, 28 g by Does not apply route QID., Disp: 120 each, Rfl: 2 .  umeclidinium bromide (INCRUSE ELLIPTA) 62.5 MCG/INH AEPB, Inhale 1 puff into the lungs daily., Disp: 1 each, Rfl: 6   Allergies  Allergen Reactions  . Avocado Anaphylaxis  . Latex Shortness Of Breath and Rash  . Codeine Nausea Only    Past Medical History:  Diagnosis Date  . Anxiety    takes Lexapro daily  . Arthritis    "back, from neck down pass my bra area" (03/25/2017)  . Asthma   . Bartholin gland cyst 08/29/2011  . Bruises easily    pt is on Effient  . Chronic back pain    herniated nucleus pulposus  . Chronic back pain    "neck to bra area; lower back" (03/25/2017)  . COPD (chronic obstructive pulmonary disease) ([place])    early stages  . Coronary artery disease   . Depression    takes Klonopin daily  . Diabetes mellitus without complication ([place])   . Diverticulosis   . GERD (gastroesophageal reflux disease)    takes Nexium daily  . H/O hiatal hernia   . Heart attack ([place]) 2011  . Hemorrhoids   . Hernia   . Hyperlipidemia    takes Lipitor daily  . Hypertension    takes Losartan daily and Labetalol bid  . Hypothyroidism    takes Synthroid daily  . Insomnia    hydroxyzine prn  . Joint pain   . Pneumonia    "couple times" (03/25/2017)  . Pre-diabetes    "just found out 1 wk ago" (03/25/2017)   . Psoriasis    elbows,knees,back  . Shortness of breath    with exertion  . Slowing of urinary stream   . Stress incontinence      Past Surgical History:  Procedure Laterality Date  . ABDOMINAL EXPLORATION SURGERY  1977  . ABDOMINAL HYSTERECTOMY  1977   "left one of my ovaries"  . BACK SURGERY    . COLONOSCOPY    . CORONARY ANGIOPLASTY WITH STENT PLACEMENT  2011 X2   "  regular stents didn't work; had to go back in in ~ 1 month and put in medicated stents"  . DILATION AND CURETTAGE OF UTERUS    . ESOPHAGOGASTRODUODENOSCOPY    . LEFT HEART CATH AND CORONARY ANGIOGRAPHY N/A 03/26/2017   Procedure: LEFT HEART CATH AND CORONARY ANGIOGRAPHY;  Surgeon: Belva Crome, MD;  Location: [facility];  Service: Cardiovascular;  Laterality: N/A;  . LUMBAR LAMINECTOMY/DECOMPRESSION MICRODISCECTOMY  08/05/2011   Procedure: LUMBAR LAMINECTOMY/DECOMPRESSION MICRODISCECTOMY;  Surgeon: Otilio Connors, MD;  Location: [facility];  Service: Neurosurgery;  Laterality: Right;  Right Lumbar four-five extraforaminal discectomy  . TONSILLECTOMY     as a child    ROS  Objective:   Vitals: BP (!) 144/72 (BP Location: Right Arm)   Pulse 87   Temp 98.4 F (36.9 C) (Temporal)   Resp (!) 24 Comment: coughing intermittently  SpO2 98%   Physical Exam Constitutional:      General: She is not in acute distress.    Appearance: Normal appearance. She is well-developed. She is not ill-appearing, toxic-appearing or diaphoretic.  HENT:     Head: Normocephalic and atraumatic.     Right Ear: Tympanic membrane and ear canal normal. No drainage or tenderness. No middle ear effusion. Tympanic membrane is not erythematous.     Left Ear: Tympanic membrane and ear canal normal. No drainage or tenderness.  No middle ear effusion. Tympanic membrane is not erythematous.     Nose: Congestion and rhinorrhea present.     Mouth/Throat:     Mouth: Mucous membranes are moist. No oral lesions.     Pharynx: No  pharyngeal swelling, oropharyngeal exudate, posterior oropharyngeal erythema or uvula swelling.     Tonsils: No tonsillar exudate or tonsillar abscesses.     Comments: Significant postnasal drainage and erythema of the posterior oropharynx.  No exudates or tonsillar erythema. Eyes:     General: No scleral icterus.    Extraocular Movements: Extraocular movements intact.     Right eye: Normal extraocular motion.     Left eye: Normal extraocular motion.     Conjunctiva/sclera: Conjunctivae normal.     Pupils: Pupils are equal, round, and reactive to light.  Neck:     Musculoskeletal: Normal range of motion and neck supple.  Cardiovascular:     Rate and Rhythm: Normal rate and regular rhythm.     Pulses: Normal pulses.     Heart sounds: Normal heart sounds. No murmur. No friction rub. No gallop.   Pulmonary:     Effort: Pulmonary effort is normal. No respiratory distress.     Breath sounds: Normal breath sounds. No stridor. No wheezing, rhonchi or rales.     Comments: Lung sounds are coarse but no rales, rhonchi.  Patient is breathing without pursed lips, tripod position. Lymphadenopathy:     Cervical: No cervical adenopathy.  Skin:    General: Skin is warm and dry.     Findings: No rash.  Neurological:     General: No focal deficit present.     Mental Status: She is alert and oriented to person, place, and time.  Psychiatric:        Mood and Affect: Mood normal.        Behavior: Behavior normal.        Thought Content: Thought content normal.     Assessment and Plan :   1. Allergic rhinitis due to other allergic trigger, unspecified seasonality   2. Shortness of breath   3.  Chronic obstructive pulmonary disease, unspecified COPD type ([place])   4. COPD exacerbation ([place])   5. Sinus congestion   6. Throat pain     We will use IM Depo-Medrol for aggressive management of her chronic rhinitis and help with her COPD.  Low likelihood of COVID-19 given patient has practice social  distancing consistently.  Her pulse oximetry is 98% and physical exam findings are overall reassuring for outpatient management, does not need to report to the emergency room.  She is to maintain all of her rhinitis medications, COPD treatments. Counseled patient on potential for adverse effects with medications prescribed/recommended today, ER and return-to-clinic precautions discussed, patient verbalized understanding.    Jaynee Eagles, [place] 04/07/19 1513

## 2019-04-07 NOTE — ED Triage Notes (Signed)
Patient complains of sob for 2 days.  Patient has sore throat and congestion that was particularly bad today  "reports stuffy nose, runny nose"  Patient reports phlegm is yellow, thick.denies fever

## 2019-04-09 ENCOUNTER — Encounter (HOSPITAL_COMMUNITY): Payer: Self-pay

## 2019-04-09 ENCOUNTER — Encounter: Payer: Self-pay | Admitting: Gastroenterology

## 2019-04-09 LAB — NOVEL CORONAVIRUS, NAA (HOSP ORDER, SEND-OUT TO REF LAB; TAT 18-24 HRS): SARS-CoV-2, NAA: NOT DETECTED

## 2019-04-12 ENCOUNTER — Ambulatory Visit
Admission: RE | Admit: 2019-04-12 | Discharge: 2019-04-12 | Disposition: A | Discharge status: Home / Self Care | Payer: No Typology Code available for payment source | Source: Ambulatory Visit | Attending: Acute Care | Admitting: Acute Care

## 2019-04-13 ENCOUNTER — Telehealth: Payer: Self-pay | Admitting: Acute Care

## 2019-04-13 NOTE — Telephone Encounter (Signed)
[name] [name] x 1 to discuss CT results (grant patient)

## 2019-04-15 ENCOUNTER — Other Ambulatory Visit: Payer: Self-pay | Admitting: Internal Medicine

## 2019-04-15 MED FILL — CYCLOBENZAPRINE 10 MG TAB: 10 | 10 days supply | Qty: 30 | Fill #0

## 2019-04-15 MED FILL — ESCITALOPRAM 20 MG TABLET: 20 | 30 days supply | Qty: 30 | Fill #0

## 2019-04-15 MED FILL — ?FUROSEMIDE 20MG TABLET: 20 | 30 days supply | Qty: 30 | Fill #0

## 2019-04-15 MED FILL — !BRILINTA 90 MG TABLET: 30 days supply | Qty: 60 | Fill #0

## 2019-04-15 MED FILL — LABETALOL HCL 100 MG TABS: 100 | 30 days supply | Qty: 60 | Fill #0

## 2019-04-15 MED FILL — LEVOTHYROXINE 100 MCG TAB: 100 | 30 days supply | Qty: 30 | Fill #0

## 2019-04-15 NOTE — Telephone Encounter (Signed)
Pt informed of CT results per Eric Form, NP.  PT verbalized understanding.  Copy sent to PCP.  Pt advised that we will f/u with her in 1 year to see if grant is available for lung screening.

## 2019-04-19 ENCOUNTER — Other Ambulatory Visit: Payer: Self-pay | Admitting: Internal Medicine

## 2019-04-19 MED FILL — OMEPRAZOLE DR 40 MG CAPSULE: 40 | 30 days supply | Qty: 30 | Fill #0

## 2019-04-19 MED FILL — LOSARTAN POTASSIUM 50 MG TA: 50 | 30 days supply | Qty: 45 | Fill #0

## 2019-04-20 ENCOUNTER — Encounter (HOSPITAL_COMMUNITY): Payer: Self-pay | Admitting: Licensed Clinical Social Worker

## 2019-04-20 ENCOUNTER — Other Ambulatory Visit: Payer: Self-pay

## 2019-04-20 ENCOUNTER — Ambulatory Visit (INDEPENDENT_AMBULATORY_CARE_PROVIDER_SITE_OTHER): Payer: Medicaid Other | Admitting: Licensed Clinical Social Worker

## 2019-04-20 DIAGNOSIS — F411 Generalized anxiety disorder: Secondary | ICD-10-CM

## 2019-04-20 MED FILL — MONTELUKAST SOD 10 MG TAB: 10 | 30 days supply | Qty: 30 | Fill #0
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
## 2019-04-21 MED FILL — ?MIRTAZAPINE 30 MG TABLET: 30 | 30 days supply | Qty: 15 | Fill #0

## 2019-04-22 MED FILL — GABAPENTIN 600 MG TABLET: 600 | 30 days supply | Qty: 120 | Fill #1

## 2019-04-23 MED FILL — $BREO ELLIPTA 200-25 MCG IN: 200-25 | 30 days supply | Qty: 60 | Fill #0

## 2019-04-23 MED FILL — $INCRUSE ELLIPTA 62.5 MCG I: 62.5 MCG | 30 days supply | Qty: 30 | Fill #0

## 2019-04-27 NOTE — Progress Notes (Signed)
Primary Care Physician:  Ladell Pier, MD  Primary Gastroenterologist:  Garfield Cornea, MD   Chief Complaint  Patient presents with  . Dysphagia    x 1 month with solids/liquids  . Abdominal Pain    "cramps on right side"  . Diarrhea    HPI:  [name] [name] is a 63 y.o. female here at the request of Freeman Caldron PA-C for dysphagia and GERD.  Patient states she had a remote EGD. Doesn't remember what they found.  Denies having esophagus stretched in the past.  Complains of chronic issues swallowing.  Worsening lately.  Has difficulty with liquids and solids.  Liquid feels like goes down wrong way sometimes causing coughing but at other times feels like a stick in upper throat area. Bread and meat feels like won't go down. Some nausea at time.  No vomiting.  No heartburn on omeprazole.  No unintentional weight loss, in fact she has gained 20 pounds in the past year.  Has to take omeprazole or recurrent symptoms. BM like diarrhea recently. Having 2-3 stools at a time. Small amount each time. No nocturnal symptoms.  Wonders if related to Metformin which she started few weeks ago. No melena, brbpr.  Chronic intermittent right flank pain. Has h/o gallbladder issues in the past in her 70s.  Wonders if it is related to a bad disc in her back.  Happens at different times.  Not usually meal related. Almost to ER once with it. Stretching and laying down can help. No associated n/v.  Lactose intolerant recently.   MI at age 71.  Stents placed at that time, required restenting fairly quickly afterwards, the second time with medicated stents.  Has been on Brilinta for years.    Current Outpatient Medications  Medication Sig Dispense Refill  . albuterol (VENTOLIN HFA) 108 (90 Base) MCG/ACT inhaler INHALE 2 PUFFS INTO THE LUNGS EVERY 6 (SIX) HOURS AS NEEDED FOR WHEEZING OR SHORTNESS OF BREATH. 18 g 2  . aspirin EC 81 MG tablet Take 81 mg by mouth daily.    Marland Kitchen atorvastatin (LIPITOR) 40 MG tablet Take 1  tablet (40 mg total) by mouth at bedtime. 30 tablet 3  . Blood Glucose Monitoring Suppl (TRUE METRIX METER) DEVI 1 kit by Does not apply route 4 (four) times daily. 1 Device 0  . busPIRone (BUSPAR) 15 MG tablet Take 1 tablet (15 mg total) by mouth 3 (three) times daily. 270 tablet 1  . Calcium 600 MG tablet Take 600 mg by mouth daily.  60 tablet   . cyclobenzaprine (FLEXERIL) 10 MG tablet TAKE 1 TABLET BY MOUTH 3 TIMES DAILY AS NEEDED. 30 tablet 0  . doxepin (SINEQUAN) 50 MG capsule Take 1 capsule (50 mg total) by mouth at bedtime. 30 capsule 2  . escitalopram (LEXAPRO) 20 MG tablet Take 1 tablet (20 mg total) by mouth daily. 30 tablet 2  . fluticasone (FLONASE) 50 MCG/ACT nasal spray Place 2 sprays into both nostrils daily. 16 g 6  . fluticasone furoate-vilanterol (BREO ELLIPTA) 200-25 MCG/INH AEPB Inhale 1 puff into the lungs daily. 60 each 6  . furosemide (LASIX) 20 MG tablet Take 1 tablet (20 mg total) by mouth daily. 30 tablet 2  . gabapentin (NEURONTIN) 600 MG tablet TAKE 1 TABLET BY MOUTH 4 TIMES DAILY 120 tablet 3  . glucose blood (TRUE METRIX BLOOD GLUCOSE TEST) test strip Use as instructed 100 each 12  . ipratropium-albuterol (DUONEB) 0.5-2.5 (3) MG/3ML SOLN TAKE 3 MLS VIA NEBULIZATION  EVERY 6 HOURS 360 mL 1  . labetalol (NORMODYNE) 100 MG tablet TAKE 1 TABLET BY MOUTH 2 (TWO) TIMES DAILY. 60 tablet 2  . levothyroxine (SYNTHROID) 100 MCG tablet Take 1 tablet (100 mcg total) by mouth daily. 30 tablet 2  . loratadine (CLARITIN) 10 MG tablet Take 1 tablet (10 mg total) by mouth daily. 30 tablet 11  . losartan (COZAAR) 50 MG tablet Take 1.5 tablets (75 mg total) by mouth daily. Needs appointment with PCP. 45 tablet 3  . metFORMIN (GLUCOPHAGE) 500 MG tablet Take 1 tablet (500 mg total) by mouth 2 (two) times daily with a meal. 180 tablet 3  . mirtazapine (REMERON) 30 MG tablet Take 0.5 tablets (15 mg total) by mouth at bedtime. 90 tablet 1  . montelukast (SINGULAIR) 10 MG tablet TAKE 1 TABLET  BY MOUTH AT BEDTIME. 30 tablet 2  . omeprazole (PRILOSEC) 40 MG capsule Take 1 capsule (40 mg total) by mouth daily. 30 capsule 1  . ondansetron (ZOFRAN) 4 MG tablet Take 1 tablet (4 mg total) every 8 (eight) hours as needed by mouth for nausea or vomiting. 20 tablet 0  . Sodium Chloride-Sodium Bicarb (NETI POT SINUS WASH) 2300-700 MG KIT Do nasal rinse three times a week as needed 1 each 0  . ticagrelor (BRILINTA) 90 MG TABS tablet TAKE 1 TABLET BY MOUTH 2 (TWO) TIMES DAILY. 60 tablet 6  . triamcinolone cream (KENALOG) 0.1 % APPLY 1 APPLICATION TOPICALLY 2 TIMES DAILY. 30 g 1  . TRUEPLUS LANCETS 28G MISC 28 g by Does not apply route QID. 120 each 2  . umeclidinium bromide (INCRUSE ELLIPTA) 62.5 MCG/INH AEPB Inhale 1 puff into the lungs daily. 1 each 6   No current facility-administered medications for this visit.     Allergies as of 04/28/2019 - Review Complete 04/28/2019  Allergen Reaction Noted  . 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 Latex Shortness Of Breath and Rash 12/22/2008  . Codeine Nausea Only 12/22/2008    Past Medical History:  Diagnosis Date  . Anxiety    takes Lexapro daily  . Arthritis    "back, from neck down pass my bra area" (03/25/2017)  . Asthma   . Bartholin gland cyst 08/29/2011  . Bruises easily    pt is on Effient  . Chronic back pain    herniated nucleus pulposus  . Chronic back pain    "neck to bra area; lower back" (03/25/2017)  . COPD (chronic obstructive pulmonary disease) ([place])    early stages  . Coronary artery disease   . Depression    takes Klonopin daily  . Diabetes mellitus without complication ([place])   . Diverticulosis   . GERD (gastroesophageal reflux disease)    takes Nexium daily  . H/O hiatal hernia   . Heart attack ([place]) 2011  . Hemorrhoids   . Hernia   . Hyperlipidemia    takes Lipitor daily  . Hypertension    takes Losartan daily and Labetalol bid  . Hypothyroidism    takes Synthroid daily  . Insomnia    hydroxyzine prn  .  Joint pain   . Pneumonia    "couple times" (03/25/2017)  . Pre-diabetes    "just found out 1 wk ago" (03/25/2017)  . Psoriasis    elbows,knees,back  . Shortness of breath    with exertion  . Slowing of urinary stream   . Stress incontinence     Past Surgical History:  Procedure Laterality Date  . ABDOMINAL EXPLORATION SURGERY  [place]   "left one of my ovaries"  . BACK SURGERY    . COLONOSCOPY  2011   Dr. Ardis Hughs: Mild diverticulosis, descending diminutive colon polyp (not retrieved), next colonoscopy 10 years  . CORONARY ANGIOPLASTY WITH STENT PLACEMENT  2011 X2   "regular stents didn't work; had to go back in in ~ 1 month and put in medicated stents"  . DILATION AND CURETTAGE OF UTERUS    . ESOPHAGOGASTRODUODENOSCOPY    . LEFT HEART CATH AND CORONARY ANGIOGRAPHY N/A 03/26/2017   Procedure: LEFT HEART CATH AND CORONARY ANGIOGRAPHY;  Surgeon: Belva Crome, MD;  Location: [facility];  Service: Cardiovascular;  Laterality: N/A;  . LUMBAR LAMINECTOMY/DECOMPRESSION MICRODISCECTOMY  08/05/2011   Procedure: LUMBAR LAMINECTOMY/DECOMPRESSION MICRODISCECTOMY;  Surgeon: Otilio Connors, MD;  Location: [facility];  Service: Neurosurgery;  Laterality: Right;  Right Lumbar four-five extraforaminal discectomy  . TONSILLECTOMY     as a child    Family History  Problem Relation Age of Onset  . Heart disease Mother   . Hypertension Mother   . Stroke Mother   . Mental illness Mother   . Heart disease Father   . Hypertension Father   . Diabetes Father   . Cancer Maternal Aunt        breast  . Thyroid disease Paternal Aunt   . Heart disease Maternal Grandfather   . Anesthesia problems Daughter   . Cancer Paternal Grandmother        mouth  . Hypotension Neg Hx   . Malignant hyperthermia Neg Hx   . Pseudochol deficiency Neg Hx   . Colon cancer Neg Hx     Social History   Socioeconomic History  . Marital status: Divorced    Spouse name: Not on  file  . Number of children: Not on file  . Years of education: Not on file  . Highest education level: Not on file  Occupational History  . Not on file  Social Needs  . Financial resource strain: Not hard at all  . Food insecurity    Worry: Never true    Inability: Never true  . Transportation needs    Medical: No    Non-medical: No  Tobacco Use  . Smoking status: Current Every Day Smoker    Packs/day: 1.00    Years: 50.00    Pack years: 50.00    Types: Cigarettes  . Smokeless tobacco: Never Used  Substance and Sexual Activity  . Alcohol use: Yes    Comment: once a week  . Drug use: No    Types: Cocaine    Comment: has been years per pt  . Sexual activity: Not Currently    Birth control/protection: Surgical  Lifestyle  . Physical activity    Days per week: 7 days    Minutes per session: 10 min  . Stress: Only a little  Relationships  . Social Herbalist on phone: Not on file    Gets together: Not on file    Attends religious service: Not on file    Active member of club or organization: Not on file    Attends meetings of clubs or organizations: Not on file    Relationship status: Not on file  . Intimate partner violence    Fear of current or ex partner: Not on file    Emotionally abused: Not on file    Physically abused: Not on file  Forced sexual activity: Not on file  Other Topics Concern  . Not on file  Social History Narrative  . Not on file      ROS:  General: Negative for anorexia, weight loss, fever, chills, fatigue, weakness.  See HPI Eyes: Negative for vision changes.  ENT: Negative for hoarseness, difficulty swallowing , nasal congestion. CV: Negative for chest pain, angina, palpitations, dyspnea on exertion, peripheral edema.  Respiratory: Negative for dyspnea at rest, dyspnea on exertion, positive chronic cough, sputum, wheezing.  GI: See history of present illness. GU:  Negative for dysuria, hematuria, urinary incontinence,  urinary frequency, nocturnal urination.  MS: Negative for joint pain, positive low back pain.  Derm: Negative for rash or itching.  Neuro: Negative for weakness, abnormal sensation, seizure, frequent headaches, memory loss, confusion.  Psych: Negative for anxiety, depression, suicidal ideation, hallucinations.  Endo: Negative for unusual weight change.  Heme: Negative for bruising or bleeding. Allergy: Negative for rash or hives.    Physical Examination:  BP 117/74   Pulse 81   Temp (!) 97.1 F (36.2 C) (Oral)   Ht '5\' 2"'  (1.575 m)   Wt 145 lb 6.4 oz (66 kg)   BMI 26.59 kg/m    General: Well-nourished, well-developed in no acute distress.  Intermittent cough Head: Normocephalic, atraumatic.   Eyes: Conjunctiva pink, no icterus. Mouth: Oropharyngeal mucosa moist and pink , no lesions erythema or exudate. Neck: Supple without thyromegaly, masses, or lymphadenopathy.  Lungs: Clear to auscultation bilaterally.  Heart: Regular rate and rhythm, no murmurs rubs or gallops.  Abdomen: Bowel sounds are normal, mild epigastric/right upper quadrant tenderness to deep palpation, nondistended, no hepatosplenomegaly or masses, no abdominal bruits or    hernia , no rebound or guarding.   Rectal: Not performed Extremities: No lower extremity edema. No clubbing or deformities.  Neuro: Alert and oriented x 4 , grossly normal neurologically.  Skin: Warm and dry, no rash or jaundice.   Psych: Alert and cooperative, normal mood and affect.  Labs: Lab Results  Component Value Date   TSH 0.352 (L) 04/05/2019   Lab Results  Component Value Date   CREATININE 0.92 04/05/2019   BUN 24 04/05/2019   NA 140 04/05/2019   K 5.0 04/05/2019   CL 102 04/05/2019   CO2 26 04/05/2019   Lab Results  Component Value Date   ALT 21 04/05/2019   AST 17 04/05/2019   ALKPHOS 107 04/05/2019   BILITOT 0.3 04/05/2019   Lab Results  Component Value Date   WBC 9.9 04/05/2019   HGB 12.6 04/05/2019   HCT 39.4  04/05/2019   MCV 91 04/05/2019   PLT 501 (H) 04/05/2019   Lab Results  Component Value Date   HGBA1C 6.1 (H) 04/05/2019     Imaging Studies: Ct Chest Lung Ca Screen Low Dose W/o Cm  Result Date: 04/13/2019 CLINICAL DATA:  Fifty pack-year smoking history.  Current smoker. EXAM: CT CHEST WITHOUT CONTRAST LOW-DOSE FOR LUNG CANCER SCREENING TECHNIQUE: Multidetector CT imaging of the chest was performed following the standard protocol without IV contrast. COMPARISON:  12/09/2018 chest radiograph. Diagnostic chest CT of 05/31/2009 FINDINGS: Cardiovascular: Aortic and branch vessel atherosclerosis. Normal heart size, without pericardial effusion. Multivessel coronary artery atherosclerosis. Mediastinum/Nodes: No mediastinal or definite hilar adenopathy, given limitations of unenhanced CT. Lungs/Pleura: No pleural fluid. Mild centrilobular emphysema. Right lower lobe scarring. Lower lobe predominant bronchial wall thickening. Bilateral pulmonary nodules, the largest of which is in the posterior left upper lobe at volume derived equivalent  diameter 4.2 mm. Upper Abdomen: Normal imaged portions of the liver, spleen, stomach, pancreas, kidneys, right adrenal gland. Mild left adrenal thickening. Abdominal aortic atherosclerosis. Musculoskeletal: Moderate midthoracic spondylosis. IMPRESSION: 1. Lung-RADS 2, benign appearance or behavior. Continue annual screening with low-dose chest CT without contrast in 12 months. 2. Age advanced coronary artery atherosclerosis. Recommend assessment of coronary risk factors and consideration of medical therapy. 3. Aortic atherosclerosis (ICD10-I70.0) and emphysema (ICD10-J43.9). Electronically Signed   By: Abigail Miyamoto M.D.   On: 04/13/2019 08:39

## 2019-04-28 ENCOUNTER — Other Ambulatory Visit: Payer: Self-pay

## 2019-04-28 ENCOUNTER — Telehealth: Payer: Self-pay | Admitting: Gastroenterology

## 2019-04-28 ENCOUNTER — Ambulatory Visit (INDEPENDENT_AMBULATORY_CARE_PROVIDER_SITE_OTHER): Payer: Self-pay | Admitting: Gastroenterology

## 2019-04-28 ENCOUNTER — Encounter: Payer: Self-pay | Admitting: Gastroenterology

## 2019-04-28 VITALS — BP 117/74 | HR 81 | Temp 97.1°F | Ht 62.0 in | Wt 145.4 lb

## 2019-04-28 DIAGNOSIS — R1310 Dysphagia, unspecified: Secondary | ICD-10-CM

## 2019-04-28 DIAGNOSIS — R1011 Right upper quadrant pain: Secondary | ICD-10-CM

## 2019-04-28 DIAGNOSIS — R1013 Epigastric pain: Secondary | ICD-10-CM | POA: Insufficient documentation

## 2019-04-28 DIAGNOSIS — R1319 Other dysphagia: Secondary | ICD-10-CM

## 2019-04-28 DIAGNOSIS — K219 Gastro-esophageal reflux disease without esophagitis: Secondary | ICD-10-CM

## 2019-04-28 DIAGNOSIS — R197 Diarrhea, unspecified: Secondary | ICD-10-CM | POA: Insufficient documentation

## 2019-04-28 NOTE — Assessment & Plan Note (Signed)
Recent self-limiting diarrhea likely side effect to Metformin.  She will continue to monitor for now.  Hopefully will settle down.  Call with any questions or concerns.

## 2019-04-28 NOTE — Telephone Encounter (Signed)
Called pt, EGD/DIL w/Propofol w/RMR scheduled for 07/22/19. Pt informed to continue Brilinta. Pt placed on cancellation list per her request. Orders entered.

## 2019-04-28 NOTE — Telephone Encounter (Signed)
Patient seen in office today. Please schedule for EGD/ED with RMR with propofol  Dx: dysphagia, epig pain/ruq pain CONTINUE BRILINTA

## 2019-04-28 NOTE — Telephone Encounter (Signed)
Please NIC for TCS in 04/2020 with RMR.

## 2019-04-28 NOTE — Patient Instructions (Addendum)
1. I will be discussing your case with our doctor and your PCP prior to scheduling upper endoscopy, to determine best management of your Brilinta. 2. You will be due for colonoscopy in October 2021.

## 2019-04-28 NOTE — Assessment & Plan Note (Addendum)
Pleasant 63 year old female presenting today with complaints of esophageal dysphagia to liquids and solids.  Heartburn well controlled on omeprazole.  Remote EGD, she cannot recall the findings.  She also complains of epigastric/right upper quadrant pain which is also noted on exam.  Question gastritis/peptic ulcer disease, cannot exclude biliary etiology.  Recommending EGD plus or minus esophageal dilation in the near future to rule out complicated GERD, peptic stricture/web/ring.  Evaluate abdominal pain at the same time.  Patient is chronically on Brilinta for history of stents, significant CAD.  Plan to remain on Brilinta for the procedure.  Discussed with Dr. Gala Romney. I have discussed the risks, alternatives, benefits with regards to but not limited to the risk of reaction to medication, bleeding, infection, perforation and the patient is agreeable to proceed. Written consent to be obtained.   Patient is due for colonoscopy next fall.

## 2019-04-29 NOTE — Telephone Encounter (Signed)
ON RECALL  °

## 2019-04-29 NOTE — Telephone Encounter (Signed)
Pre-op appt 07/20/19 at 10:30am, COVID test 11:30am. Appt letter mailed with procedure instructions.

## 2019-05-03 ENCOUNTER — Other Ambulatory Visit: Payer: Self-pay

## 2019-05-03 DIAGNOSIS — Z20822 Contact with and (suspected) exposure to covid-19: Secondary | ICD-10-CM

## 2019-05-04 ENCOUNTER — Ambulatory Visit (INDEPENDENT_AMBULATORY_CARE_PROVIDER_SITE_OTHER): Payer: Medicaid Other | Admitting: Licensed Clinical Social Worker

## 2019-05-04 ENCOUNTER — Other Ambulatory Visit: Payer: Self-pay

## 2019-05-04 ENCOUNTER — Encounter (HOSPITAL_COMMUNITY): Payer: Self-pay | Admitting: Licensed Clinical Social Worker

## 2019-05-04 DIAGNOSIS — F411 Generalized anxiety disorder: Secondary | ICD-10-CM

## 2019-05-04 NOTE — Progress Notes (Signed)
Virtual Visit via Phone Note  I connected with [name] [name] on 05/04/19 at 11:10am by phone enabled telemedicine application and verified that I am speaking with the correct person using two identifiers. Pt does not have a computer or internet.   I discussed the limitations of evaluation and management by telemedicine and the availability of in person appointments. The patient expressed understanding and agreed to proceed.  History of Present Illness: Pt was referred to OP therapy by Psychiatrist, Dr. Casimiro Needle, for her anxiety, depression and Bipolar disorder.     Observations/Objective:  Pt presents for her therapy session by phone today. Pt discussed her psychiatric symptoms and current life events. Pt presented sick today. "I have been exposed to the covid virus and had a test last night." Pt described her feelings. Validated patient's emotions. Reviewed tx plan with patient and she verbalized acceptance of the plan. Pt reports she still hopes to move into her new apartment on 11/1. Discussed her differing emotions about moving.   Assessment and Plan: Pt continues to want weekly therapy by phone. She does not want to come into the office for therapy.   Follow Up Instructions:   I discussed the assessment and treatment plan with the patient. The patient was provided an opportunity to ask questions and all were answered. The patient agreed with the plan and demonstrated an understanding of the instructions.   The patient was advised to call back or seek an in-person evaluation if the symptoms worsen or if the condition fails to improve as anticipated.  I provided  25 minutes of non-face-to-face time during this encounter.   MACKENZIE,LISBETH S, LCAS

## 2019-05-06 ENCOUNTER — Other Ambulatory Visit: Payer: Self-pay

## 2019-05-06 ENCOUNTER — Ambulatory Visit: Payer: HRSA Program | Attending: Internal Medicine | Admitting: Physician Assistant

## 2019-05-06 DIAGNOSIS — R509 Fever, unspecified: Secondary | ICD-10-CM

## 2019-05-06 DIAGNOSIS — J4489 Other specified chronic obstructive pulmonary disease: Secondary | ICD-10-CM

## 2019-05-06 DIAGNOSIS — J449 Chronic obstructive pulmonary disease, unspecified: Secondary | ICD-10-CM

## 2019-05-06 DIAGNOSIS — R05 Cough: Secondary | ICD-10-CM

## 2019-05-06 DIAGNOSIS — E1165 Type 2 diabetes mellitus with hyperglycemia: Secondary | ICD-10-CM

## 2019-05-06 DIAGNOSIS — Z20828 Contact with and (suspected) exposure to other viral communicable diseases: Secondary | ICD-10-CM | POA: Diagnosis not present

## 2019-05-06 DIAGNOSIS — Z20822 Contact with and (suspected) exposure to covid-19: Secondary | ICD-10-CM

## 2019-05-06 DIAGNOSIS — R059 Cough, unspecified: Secondary | ICD-10-CM

## 2019-05-06 MED ORDER — PREDNISONE 10 MG PO TABS: ORAL_TABLET | ORAL | 0 refills | Status: DC

## 2019-05-06 MED ORDER — AZITHROMYCIN 250 MG PO TABS: ORAL_TABLET | ORAL | 0 refills | Status: DC

## 2019-05-06 MED ORDER — BENZONATATE 100 MG PO CAPS: 200.0000 mg | ORAL_CAPSULE | Freq: Three times a day (TID) | ORAL | 0 refills | Status: DC | PRN

## 2019-05-06 MED FILL — AZITHROMYCIN 250 MG TABLET: 250 | 5 days supply | Qty: 6 | Fill #0

## 2019-05-06 MED FILL — ?PREDNISONE 10 MG TABLET: 10 | 6 days supply | Qty: 21 | Fill #0

## 2019-05-06 MED FILL — BENZONATATE 100 MG CAPS: 100 | 13 days supply | Qty: 40 | Fill #0

## 2019-05-06 NOTE — Progress Notes (Signed)
Patient ID: [name] [name], female   DOB: 12-12-1955, 63 y.o.   MRN: [id number] Virtual Visit via Telephone Note  I connected with [name] on 05/06/19 at  9:30 AM EDT by telephone and verified that I am speaking with the correct person using two identifiers.   I discussed the limitations, risks, security and privacy concerns of performing an evaluation and management service by telephone and the availability of in person appointments. I also discussed with the patient that there may be a patient responsible charge related to this service. The patient expressed understanding and agreed to proceed.  Patient location:  home My Location:  home office Persons on the call:  Me and the patient and her daughter   History of Present Illness:  4 day h/o fever and cough.  No smell and taste.  Temp to 100 yesterday.  Using nebs every hour. Poor appetite.  Able to drink liquids and force eating.  Her daughter is helping take care of her.  No N/V/D.   Covid test pending from 10/19    Observations/Objective:  Coughs often, A&Ox3   Assessment and Plan: 1. Suspected COVID-19 virus infection Will cover aggressively.  Patient agrees to seek emergency care if breathing worsens/respiratory distress. - predniSONE (DELTASONE) 10 MG tablet; 6,5,4,3,2,1 take each days dose all at once in the morning  Dispense: 21 tablet; Refill: 0 - azithromycin (ZITHROMAX) 250 MG tablet; Take 2 today then 1 daily  Dispense: 6 tablet; Refill: 0  2. Fever, unspecified fever cause See #1.  Fever care/tylenol/advil/hydration imperative  3. Cough - benzonatate (TESSALON) 100 MG capsule; Take 2 capsules (200 mg total) by mouth 3 (three) times daily as needed for cough.  Dispense: 40 capsule; Refill: 0 - predniSONE (DELTASONE) 10 MG tablet; 6,5,4,3,2,1 take each days dose all at once in the morning  Dispense: 21 tablet; Refill: 0  4. COPD with chronic bronchitis (HCC) - predniSONE (DELTASONE) 10 MG tablet; 6,5,4,3,2,1 take each  days dose all at once in the morning  Dispense: 21 tablet; Refill: 0 - azithromycin (ZITHROMAX) 250 MG tablet; Take 2 today then 1 daily  Dispense: 6 tablet; Refill: 0  5. Type 2 diabetes mellitus with hyperglycemia, without long-term current use of insulin (HCC) Blood sugars have been good since starting metformin.  Watch closely on prednisone    Follow Up Instructions: 07/02/2019 appt with PCP;  Sooner if needed  I discussed the assessment and treatment plan with the patient. The patient was provided an opportunity to ask questions and all were answered. The patient agreed with the plan and demonstrated an understanding of the instructions.   The patient was advised to call back or seek an in-person evaluation if the symptoms worsen or if the condition fails to improve as anticipated.  I provided 17 minutes of non-face-to-face time during this encounter.   Freeman Caldron, PA-C

## 2019-05-07 ENCOUNTER — Other Ambulatory Visit: Payer: Self-pay | Admitting: Internal Medicine

## 2019-05-07 MED FILL — DOXEPIN 50 MG CAPSULE: 50 | 30 days supply | Qty: 30 | Fill #1

## 2019-05-07 MED FILL — ?ATORVASTATIN 40MG TABLET: 40 | 30 days supply | Qty: 30 | Fill #0

## 2019-05-07 MED FILL — ESCITALOPRAM 20 MG TABLET: 20 | 30 days supply | Qty: 30 | Fill #1

## 2019-05-07 MED FILL — LEVOTHYROXINE 100 MCG TAB: 100 | 30 days supply | Qty: 30 | Fill #1

## 2019-05-07 MED FILL — CYCLOBENZAPRINE 10 MG TAB: 10 | 10 days supply | Qty: 30 | Fill #0

## 2019-05-10 ENCOUNTER — Telehealth: Payer: Self-pay | Admitting: Internal Medicine

## 2019-05-10 NOTE — Telephone Encounter (Signed)
Will forward to pcp

## 2019-05-10 NOTE — Telephone Encounter (Signed)
Called pt, EGD/DIL moved up to 05/27/19 at 9:30am. Endo scheduler informed. Pre-op 05/25/19 at 10:00am, COVID test 11:00am. Appt letter mailed with new procedure instructions.

## 2019-05-10 NOTE — Telephone Encounter (Signed)
Patient called stating she is having trouble breathing would like to know if she could be given prednisone or antibiotics to help her breathing. Please follow up.

## 2019-05-11 NOTE — Telephone Encounter (Signed)
Contacted pt and left a detailed vm informing pt of Dr. Johnson message and if she has any questions or concerns to give me a call  

## 2019-05-14 MED FILL — ?METFORMIN HCL 500MG TABLET: 500 | 30 days supply | Qty: 60 | Fill #1

## 2019-05-17 MED FILL — ?MIRTAZAPINE 30 MG TABLET: 30 | 30 days supply | Qty: 15 | Fill #1

## 2019-05-17 MED FILL — busPIRone HCL 15 MG TABS: 15 | 30 days supply | Qty: 90 | Fill #1

## 2019-05-17 MED FILL — LOSARTAN POTASSIUM 50 MG TA: 50 | 30 days supply | Qty: 45 | Fill #1

## 2019-05-17 MED FILL — MONTELUKAST SOD 10 MG TAB: 10 | 30 days supply | Qty: 30 | Fill #1

## 2019-05-17 MED FILL — OMEPRAZOLE DR 40 MG CAPSULE: 40 | 30 days supply | Qty: 30 | Fill #1

## 2019-05-20 ENCOUNTER — Encounter (HOSPITAL_COMMUNITY): Payer: Self-pay | Admitting: Licensed Clinical Social Worker

## 2019-05-20 ENCOUNTER — Ambulatory Visit (INDEPENDENT_AMBULATORY_CARE_PROVIDER_SITE_OTHER): Payer: Medicaid Other | Admitting: Licensed Clinical Social Worker

## 2019-05-20 ENCOUNTER — Other Ambulatory Visit: Payer: Self-pay

## 2019-05-20 DIAGNOSIS — F411 Generalized anxiety disorder: Secondary | ICD-10-CM

## 2019-05-20 NOTE — Progress Notes (Signed)
Virtual Visit via Phone Note  I connected with [name] [name] on 05/20/19 at 11:20am by phone enabled telemedicine application and verified that I am speaking with the correct person using two identifiers. Pt does not have a computer or internet.   I discussed the limitations of evaluation and management by telemedicine and the availability of in person appointments. The patient expressed understanding and agreed to proceed.  History of Present Illness: Pt was referred to OP therapy by Psychiatrist, Dr. Casimiro Needle, for her anxiety, depression and Bipolar disorder.     Observations/Objective:  Pt presents for her therapy session by phone today. Pt discussed her psychiatric symptoms and current life events. Since last session patient had a covid test and her PCP treated her for the virus. She is in the process of moving in her new apartment. Patient is having adjustment issues due to moving in her own place. Used CBT to help patient  focus on changing her thought patterns to help her problem-solve issues and develop positive coping methods.    Assessment and Plan: Pt continues to want weekly therapy by phone. She does not want to come into the office for therapy.   Follow Up Instructions:   I discussed the assessment and treatment plan with the patient. The patient was provided an opportunity to ask questions and all were answered. The patient agreed with the plan and demonstrated an understanding of the instructions.   The patient was advised to call back or seek an in-person evaluation if the symptoms worsen or if the condition fails to improve as anticipated.  I provided  40 minutes of non-face-to-face time during this encounter.   [name] [name] S, LCAS

## 2019-05-24 ENCOUNTER — Encounter (HOSPITAL_COMMUNITY): Payer: Self-pay

## 2019-05-25 ENCOUNTER — Other Ambulatory Visit: Payer: Self-pay

## 2019-05-25 ENCOUNTER — Other Ambulatory Visit (HOSPITAL_COMMUNITY)
Admission: RE | Admit: 2019-05-25 | Discharge: 2019-05-25 | Disposition: A | Discharge status: Home / Self Care | Payer: Self-pay | Source: Ambulatory Visit | Attending: Internal Medicine | Admitting: Internal Medicine

## 2019-05-25 ENCOUNTER — Encounter (HOSPITAL_COMMUNITY)
Admission: RE | Admit: 2019-05-25 | Discharge: 2019-05-25 | Disposition: A | Discharge status: Home / Self Care | Payer: Self-pay | Source: Ambulatory Visit | Attending: Internal Medicine | Admitting: Internal Medicine

## 2019-05-25 DIAGNOSIS — Z20828 Contact with and (suspected) exposure to other viral communicable diseases: Secondary | ICD-10-CM | POA: Insufficient documentation

## 2019-05-25 DIAGNOSIS — Z01812 Encounter for preprocedural laboratory examination: Secondary | ICD-10-CM | POA: Insufficient documentation

## 2019-05-25 LAB — SARS CORONAVIRUS 2 (TAT 6-24 HRS): SARS Coronavirus 2: NEGATIVE

## 2019-05-27 ENCOUNTER — Encounter (HOSPITAL_COMMUNITY)
Admission: RE | Disposition: A | Discharge status: Home / Self Care | Payer: Self-pay | Source: Home / Self Care | Attending: Internal Medicine

## 2019-05-27 ENCOUNTER — Other Ambulatory Visit: Payer: Self-pay

## 2019-05-27 ENCOUNTER — Ambulatory Visit (HOSPITAL_COMMUNITY)
Admission: RE | Admit: 2019-05-27 | Discharge: 2019-05-27 | Disposition: A | Discharge status: Home / Self Care | Payer: Self-pay | Attending: Internal Medicine | Admitting: Internal Medicine

## 2019-05-27 ENCOUNTER — Ambulatory Visit (HOSPITAL_COMMUNITY): Payer: Self-pay | Admitting: Anesthesiology

## 2019-05-27 DIAGNOSIS — M1990 Unspecified osteoarthritis, unspecified site: Secondary | ICD-10-CM | POA: Insufficient documentation

## 2019-05-27 DIAGNOSIS — G8929 Other chronic pain: Secondary | ICD-10-CM | POA: Insufficient documentation

## 2019-05-27 DIAGNOSIS — G4700 Insomnia, unspecified: Secondary | ICD-10-CM | POA: Insufficient documentation

## 2019-05-27 DIAGNOSIS — I252 Old myocardial infarction: Secondary | ICD-10-CM | POA: Insufficient documentation

## 2019-05-27 DIAGNOSIS — F17210 Nicotine dependence, cigarettes, uncomplicated: Secondary | ICD-10-CM | POA: Insufficient documentation

## 2019-05-27 DIAGNOSIS — E119 Type 2 diabetes mellitus without complications: Secondary | ICD-10-CM | POA: Insufficient documentation

## 2019-05-27 DIAGNOSIS — R1310 Dysphagia, unspecified: Secondary | ICD-10-CM

## 2019-05-27 DIAGNOSIS — Z8489 Family history of other specified conditions: Secondary | ICD-10-CM | POA: Insufficient documentation

## 2019-05-27 DIAGNOSIS — I10 Essential (primary) hypertension: Secondary | ICD-10-CM | POA: Insufficient documentation

## 2019-05-27 DIAGNOSIS — Z91018 Allergy to other foods: Secondary | ICD-10-CM | POA: Insufficient documentation

## 2019-05-27 DIAGNOSIS — Z8349 Family history of other endocrine, nutritional and metabolic diseases: Secondary | ICD-10-CM | POA: Insufficient documentation

## 2019-05-27 DIAGNOSIS — I2510 Atherosclerotic heart disease of native coronary artery without angina pectoris: Secondary | ICD-10-CM | POA: Insufficient documentation

## 2019-05-27 DIAGNOSIS — Z823 Family history of stroke: Secondary | ICD-10-CM | POA: Insufficient documentation

## 2019-05-27 DIAGNOSIS — K219 Gastro-esophageal reflux disease without esophagitis: Secondary | ICD-10-CM | POA: Insufficient documentation

## 2019-05-27 DIAGNOSIS — Z91040 Latex allergy status: Secondary | ICD-10-CM | POA: Insufficient documentation

## 2019-05-27 DIAGNOSIS — E039 Hypothyroidism, unspecified: Secondary | ICD-10-CM | POA: Insufficient documentation

## 2019-05-27 DIAGNOSIS — J449 Chronic obstructive pulmonary disease, unspecified: Secondary | ICD-10-CM | POA: Insufficient documentation

## 2019-05-27 DIAGNOSIS — Z803 Family history of malignant neoplasm of breast: Secondary | ICD-10-CM | POA: Insufficient documentation

## 2019-05-27 DIAGNOSIS — E785 Hyperlipidemia, unspecified: Secondary | ICD-10-CM | POA: Insufficient documentation

## 2019-05-27 DIAGNOSIS — R1319 Other dysphagia: Secondary | ICD-10-CM

## 2019-05-27 DIAGNOSIS — R1013 Epigastric pain: Secondary | ICD-10-CM

## 2019-05-27 DIAGNOSIS — Z7951 Long term (current) use of inhaled steroids: Secondary | ICD-10-CM | POA: Insufficient documentation

## 2019-05-27 DIAGNOSIS — Z885 Allergy status to narcotic agent status: Secondary | ICD-10-CM | POA: Insufficient documentation

## 2019-05-27 DIAGNOSIS — K319 Disease of stomach and duodenum, unspecified: Secondary | ICD-10-CM | POA: Insufficient documentation

## 2019-05-27 DIAGNOSIS — K3189 Other diseases of stomach and duodenum: Secondary | ICD-10-CM

## 2019-05-27 DIAGNOSIS — R32 Unspecified urinary incontinence: Secondary | ICD-10-CM | POA: Insufficient documentation

## 2019-05-27 DIAGNOSIS — Z8249 Family history of ischemic heart disease and other diseases of the circulatory system: Secondary | ICD-10-CM | POA: Insufficient documentation

## 2019-05-27 DIAGNOSIS — R1011 Right upper quadrant pain: Secondary | ICD-10-CM

## 2019-05-27 DIAGNOSIS — L409 Psoriasis, unspecified: Secondary | ICD-10-CM | POA: Insufficient documentation

## 2019-05-27 DIAGNOSIS — Z800 Family history of malignant neoplasm of digestive organs: Secondary | ICD-10-CM | POA: Insufficient documentation

## 2019-05-27 DIAGNOSIS — K449 Diaphragmatic hernia without obstruction or gangrene: Secondary | ICD-10-CM | POA: Insufficient documentation

## 2019-05-27 DIAGNOSIS — K259 Gastric ulcer, unspecified as acute or chronic, without hemorrhage or perforation: Secondary | ICD-10-CM | POA: Insufficient documentation

## 2019-05-27 DIAGNOSIS — F419 Anxiety disorder, unspecified: Secondary | ICD-10-CM | POA: Insufficient documentation

## 2019-05-27 DIAGNOSIS — F319 Bipolar disorder, unspecified: Secondary | ICD-10-CM | POA: Insufficient documentation

## 2019-05-27 DIAGNOSIS — R1314 Dysphagia, pharyngoesophageal phase: Secondary | ICD-10-CM | POA: Insufficient documentation

## 2019-05-27 DIAGNOSIS — Z833 Family history of diabetes mellitus: Secondary | ICD-10-CM | POA: Insufficient documentation

## 2019-05-27 DIAGNOSIS — Z90710 Acquired absence of both cervix and uterus: Secondary | ICD-10-CM | POA: Insufficient documentation

## 2019-05-27 DIAGNOSIS — M549 Dorsalgia, unspecified: Secondary | ICD-10-CM | POA: Insufficient documentation

## 2019-05-27 DIAGNOSIS — Z955 Presence of coronary angioplasty implant and graft: Secondary | ICD-10-CM | POA: Insufficient documentation

## 2019-05-27 DIAGNOSIS — Z7984 Long term (current) use of oral hypoglycemic drugs: Secondary | ICD-10-CM | POA: Insufficient documentation

## 2019-05-27 HISTORY — PX: BIOPSY: SHX5522

## 2019-05-27 HISTORY — PX: ESOPHAGOGASTRODUODENOSCOPY (EGD) WITH PROPOFOL: SHX5813

## 2019-05-27 HISTORY — PX: MALONEY DILATION: SHX5535

## 2019-05-27 LAB — GLUCOSE, CAPILLARY: Glucose-Capillary: 92 mg/dL (ref 70–99)

## 2019-05-27 SURGERY — ESOPHAGOGASTRODUODENOSCOPY (EGD) WITH PROPOFOL
Anesthesia: General

## 2019-05-27 MED ORDER — CHLORHEXIDINE GLUCONATE CLOTH 2 % EX PADS: 6.0000 | MEDICATED_PAD | Freq: Once | CUTANEOUS | Status: DC

## 2019-05-27 MED ORDER — LIDOCAINE VISCOUS HCL 2 % MT SOLN
5.0000 mL | Freq: Once | OROMUCOSAL | Status: AC
  Administered 2019-05-27: 5 mL via OROMUCOSAL

## 2019-05-27 MED ORDER — PROPOFOL 500 MG/50ML IV EMUL
INTRAVENOUS | Status: DC | PRN
  Administered 2019-05-27: 200 ug/kg/min via INTRAVENOUS

## 2019-05-27 MED ORDER — GLYCOPYRROLATE 0.2 MG/ML IJ SOLN
INTRAMUSCULAR | Status: DC | PRN
  Administered 2019-05-27: 0.2 mg via INTRAVENOUS

## 2019-05-27 MED ORDER — KETAMINE HCL 50 MG/5ML IJ SOSY
PREFILLED_SYRINGE | INTRAMUSCULAR | Status: AC
  Filled 2019-05-27: qty 5

## 2019-05-27 MED ORDER — LACTATED RINGERS IV SOLN
INTRAVENOUS | Status: DC | PRN
  Administered 2019-05-27: 09:00:00 via INTRAVENOUS

## 2019-05-27 MED ORDER — MIDAZOLAM HCL 2 MG/2ML IJ SOLN: 0.5000 mg | Freq: Once | INTRAMUSCULAR | Status: DC | PRN

## 2019-05-27 MED ORDER — GLYCOPYRROLATE 0.2 MG/ML IJ SOLN: INTRAMUSCULAR | Status: DC | PRN

## 2019-05-27 MED ORDER — PROMETHAZINE HCL 25 MG/ML IJ SOLN: 6.2500 mg | INTRAMUSCULAR | Status: DC | PRN

## 2019-05-27 MED ORDER — PROPOFOL 10 MG/ML IV BOLUS
INTRAVENOUS | Status: DC | PRN
  Administered 2019-05-27: 60 mg via INTRAVENOUS

## 2019-05-27 MED ORDER — KETAMINE HCL 10 MG/ML IJ SOLN
INTRAMUSCULAR | Status: DC | PRN
  Administered 2019-05-27: 10 mg via INTRAVENOUS

## 2019-05-27 MED ORDER — LACTATED RINGERS IV SOLN: INTRAVENOUS | Status: DC

## 2019-05-27 NOTE — Discharge Instructions (Signed)
EGD Discharge instructions Please read the instructions outlined below and refer to this sheet in the next few weeks. These discharge instructions provide you with general information on caring for yourself after you leave the hospital. Your doctor may also give you specific instructions. While your treatment has been planned according to the most current medical practices available, unavoidable complications occasionally occur. If you have any problems or questions after discharge, please call your doctor. ACTIVITY  You may resume your regular activity but move at a slower pace for the next 24 hours.   Take frequent rest periods for the next 24 hours.   Walking will help expel (get rid of) the air and reduce the bloated feeling in your abdomen.   No driving for 24 hours (because of the anesthesia (medicine) used during the test).   You may shower.   Do not sign any important legal documents or operate any machinery for 24 hours (because of the anesthesia used during the test).  NUTRITION  Drink plenty of fluids.   You may resume your normal diet.   Begin with a light meal and progress to your normal diet.   Avoid alcoholic beverages for 24 hours or as instructed by your caregiver.  MEDICATIONS  You may resume your normal medications unless your caregiver tells you otherwise.  WHAT YOU CAN EXPECT TODAY  You may experience abdominal discomfort such as a feeling of fullness or gas pains.  FOLLOW-UP  Your doctor will discuss the results of your test with you.  SEEK IMMEDIATE MEDICAL ATTENTION IF ANY OF THE FOLLOWING OCCUR:  Excessive nausea (feeling sick to your stomach) and/or vomiting.   Severe abdominal pain and distention (swelling).   Trouble swallowing.   Temperature over 101 F (37.8 C).   Rectal bleeding or vomiting of blood.    Continue omeprazole 40 mg daily  Further recommendations to follow pending review of pathology report  Office visit with [place] in 3  months  At patient request, I reviewed findings and recommendations with Tiffany at [phone]     Monitored Anesthesia Care, Care After These instructions provide you with information about caring for yourself after your procedure. Your health care provider may also give you more specific instructions. Your treatment has been planned according to current medical practices, but problems sometimes occur. Call your health care provider if you have any problems or questions after your procedure. What can I expect after the procedure? After your procedure, you may:  Feel sleepy for several hours.  Feel clumsy and have poor balance for several hours.  Feel forgetful about what happened after the procedure.  Have poor judgment for several hours.  Feel nauseous or vomit.  Have a sore throat if you had a breathing tube during the procedure. Follow these instructions at home: For at least 24 hours after the procedure:      Have a responsible adult stay with you. It is important to have someone help care for you until you are awake and alert.  Rest as needed.  Do not: ? Participate in activities in which you could fall or become injured. ? Drive. ? Use heavy machinery. ? Drink alcohol. ? Take sleeping pills or medicines that cause drowsiness. ? Make important decisions or sign legal documents. ? Take care of children on your own. Eating and drinking  Follow the diet that is recommended by your health care provider.  If you vomit, drink water, juice, or soup when you can drink without vomiting.  Make sure you have little or no nausea before eating solid foods. General instructions  Take over-the-counter and prescription medicines only as told by your health care provider.  If you have sleep apnea, surgery and certain medicines can increase your risk for breathing problems. Follow instructions from your health care provider about wearing your sleep device: ? Anytime you are  sleeping, including during daytime naps. ? While taking prescription pain medicines, sleeping medicines, or medicines that make you drowsy.  If you smoke, do not smoke without supervision.  Keep all follow-up visits as told by your health care provider. This is important. Contact a health care provider if:  You keep feeling nauseous or you keep vomiting.  You feel light-headed.  You develop a rash.  You have a fever. Get help right away if:  You have trouble breathing. Summary  For several hours after your procedure, you may feel sleepy and have poor judgment.  Have a responsible adult stay with you for at least 24 hours or until you are awake and alert. This information is not intended to replace advice given to you by your health care provider. Make sure you discuss any questions you have with your health care provider. Document Released: 10/22/2015 Document Revised: 09/29/2017 Document Reviewed: 10/22/2015 Elsevier Patient Education  2020 [employer].

## 2019-05-27 NOTE — Transfer of Care (Signed)
Immediate Anesthesia Transfer of Care Note  Patient: [name] [name]  Procedure(s) Performed: ESOPHAGOGASTRODUODENOSCOPY (EGD) WITH PROPOFOL (N/A ) MALONEY DILATION (N/A ) BIOPSY  Patient Location: PACU  Anesthesia Type:General  Level of Consciousness: awake, alert  and patient cooperative  Airway & Oxygen Therapy: Patient Spontanous Breathing  Post-op Assessment: Report given to RN and Post -op Vital signs reviewed and stable  Post vital signs: Reviewed and stable  Last Vitals:  Vitals Value Taken Time  BP    Temp    Pulse    Resp    SpO2      Last Pain:  Vitals:   05/27/19 0905  TempSrc:   PainSc: 0-No pain      Patients Stated Pain Goal: 5 ([id number])  Complications: No apparent anesthesia complications  SEE PACU FLOW SHEET FOR VITAL SIGNS

## 2019-05-27 NOTE — Anesthesia Procedure Notes (Signed)
Procedure Name: MAC Date/Time: 05/27/2019 9:10 AM Performed by: Vista Deck, CRNA Pre-anesthesia Checklist: Patient identified, Emergency Drugs available, Suction available, Timeout performed and Patient being monitored Patient Re-evaluated:Patient Re-evaluated prior to induction Oxygen Delivery Method: Nasal Cannula

## 2019-05-27 NOTE — Anesthesia Preprocedure Evaluation (Signed)
Anesthesia Evaluation  Patient identified by MRN, date of birth, ID band Patient awake    Reviewed: Allergy & Precautions, NPO status , Patient's Chart, lab work & pertinent test results  Airway Mallampati: II  TM Distance: >3 FB Neck ROM: Full    Dental no notable dental hx. (+) Edentulous Upper, Edentulous Lower   Pulmonary shortness of breath and with exertion, asthma , pneumonia, resolved, COPD,  COPD inhaler, Current SmokerPatient did not abstain from smoking.,    Pulmonary exam normal breath sounds clear to auscultation       Cardiovascular Exercise Tolerance: Poor hypertension, Pt. on medications + CAD, + Past MI and + Cardiac Stents  Normal cardiovascular examII Rhythm:Regular Rate:Normal  Limited ET DOE H/o MI/Stents -denies NTG use  Continues to smoke /smoked today    Neuro/Psych Anxiety Depression Bipolar Disorder negative neurological ROS  negative psych ROS   GI/Hepatic Neg liver ROS, hiatal hernia, GERD  Medicated and Controlled,  Endo/Other  diabetes, Type 2, Oral Hypoglycemic AgentsHypothyroidism   Renal/GU negative Renal ROS  negative genitourinary   Musculoskeletal negative musculoskeletal ROS (+)   Abdominal   Peds negative pediatric ROS (+)  Hematology negative hematology ROS (+)   Anesthesia Other Findings   Reproductive/Obstetrics negative OB ROS                             Anesthesia Physical Anesthesia Plan  ASA: IV  Anesthesia Plan: General   Post-op Pain Management:    Induction: Intravenous  PONV Risk Score and Plan: 2 and TIVA, Propofol infusion and Treatment may vary due to age or medical condition  Airway Management Planned: Simple Face Mask and Nasal Cannula  Additional Equipment:   Intra-op Plan:   Post-operative Plan:   Informed Consent: I have reviewed the patients History and Physical, chart, labs and discussed the procedure including  the risks, benefits and alternatives for the proposed anesthesia with the patient or authorized representative who has indicated his/her understanding and acceptance.     Dental advisory given  Plan Discussed with: CRNA  Anesthesia Plan Comments: (Plan Full PPE use  Plan GA with GETA as needed d/w pt -WTP with same after Q&A  D/w pt increased risk of needing ETT use  D/w pt unlikely need for postprocedure ventilation -voiced understanding -WTP)        Anesthesia Quick Evaluation

## 2019-05-27 NOTE — Anesthesia Postprocedure Evaluation (Signed)
Anesthesia Post Note  Patient: [name] [name]  Procedure(s) Performed: ESOPHAGOGASTRODUODENOSCOPY (EGD) WITH PROPOFOL (N/A ) MALONEY DILATION (N/A ) BIOPSY  Patient location during evaluation: PACU Anesthesia Type: General Level of consciousness: awake and alert and patient cooperative Pain management: satisfactory to patient Respiratory status: spontaneous breathing Cardiovascular status: stable Postop Assessment: no apparent nausea or vomiting Anesthetic complications: no     Last Vitals:  Vitals:   05/27/19 0856 05/27/19 0933  BP: 137/76 127/61  Pulse: 69 75  Resp: 18 20  Temp: 36.7 C 36.6 C  SpO2: 95% 100%    Last Pain:  Vitals:   05/27/19 0933  TempSrc:   PainSc: 6                  Ronzell Laban

## 2019-05-27 NOTE — H&P (Signed)
_0 @   Primary Care Physician:  Ladell Pier, MD Primary Gastroenterologist:  Dr. Gala Romney  Pre-Procedure History & Physical: HPI:  [name] [name] is a 63 y.o. female here for for further evaluation of esophageal dysphagia via EGD with possible esophageal dilation as feasible/appropriate per plan.  Past Medical History:  Diagnosis Date  . Anxiety    takes Lexapro daily  . Arthritis    "back, from neck down pass my bra area" (03/25/2017)  . Asthma   . Bartholin gland cyst 08/29/2011  . Bruises easily    pt is on Effient  . Chronic back pain    herniated nucleus pulposus  . Chronic back pain    "neck to bra area; lower back" (03/25/2017)  . COPD (chronic obstructive pulmonary disease) ([place])    early stages  . Coronary artery disease   . Depression    takes Klonopin daily  . Diabetes mellitus without complication ([place])   . Diverticulosis   . GERD (gastroesophageal reflux disease)    takes Nexium daily  . H/O hiatal hernia   . Heart attack ([place]) 2011  . Hemorrhoids   . Hernia   . Hyperlipidemia    takes Lipitor daily  . Hypertension    takes Losartan daily and Labetalol bid  . Hypothyroidism    takes Synthroid daily  . Insomnia    hydroxyzine prn  . Joint pain   . Pneumonia    "couple times" (03/25/2017)  . Pre-diabetes    "just found out 1 wk ago" (03/25/2017)  . Psoriasis    elbows,knees,back  . Shortness of breath    with exertion  . Slowing of urinary stream   . Stress incontinence     Past Surgical History:  Procedure Laterality Date  . ABDOMINAL EXPLORATION SURGERY  1977  . ABDOMINAL HYSTERECTOMY  1977   "left one of my ovaries"  . BACK SURGERY    . COLONOSCOPY  2011   Dr. Ardis Hughs: Mild diverticulosis, descending diminutive colon polyp (not retrieved), next colonoscopy 10 years  . CORONARY ANGIOPLASTY WITH STENT PLACEMENT  2011 X2   "regular stents didn't work; had to go back in in ~ 1 month and put in medicated stents"  . DILATION AND CURETTAGE  OF UTERUS    . ESOPHAGOGASTRODUODENOSCOPY    . LEFT HEART CATH AND CORONARY ANGIOGRAPHY N/A 03/26/2017   Procedure: LEFT HEART CATH AND CORONARY ANGIOGRAPHY;  Surgeon: Belva Crome, MD;  Location: [facility];  Service: Cardiovascular;  Laterality: N/A;  . LUMBAR LAMINECTOMY/DECOMPRESSION MICRODISCECTOMY  08/05/2011   Procedure: LUMBAR LAMINECTOMY/DECOMPRESSION MICRODISCECTOMY;  Surgeon: Otilio Connors, MD;  Location: [facility];  Service: Neurosurgery;  Laterality: Right;  Right Lumbar four-five extraforaminal discectomy  . TONSILLECTOMY     as a child    Prior to Admission medications   Medication Sig Start Date End Date Taking? Authorizing Provider  albuterol (VENTOLIN HFA) 108 (90 Base) MCG/ACT inhaler INHALE 2 PUFFS INTO THE LUNGS EVERY 6 (SIX) HOURS AS NEEDED FOR WHEEZING OR SHORTNESS OF BREATH. Patient taking differently: Inhale 2 puffs into the lungs every 6 (six) hours as needed for wheezing or shortness of breath.  03/25/19  Yes Argentina Donovan, PA-C  aspirin EC 81 MG tablet Take 81 mg by mouth daily.   Yes [provider]  atorvastatin (LIPITOR) 40 MG tablet Take 1 tablet (40 mg total) by mouth at bedtime. 03/25/19  Yes McClung, Angela M, PA-C  benzonatate (TESSALON) 100 MG capsule Take  2 capsules (200 mg total) by mouth 3 (three) times daily as needed for cough. 05/06/19  Yes McClung, Angela M, PA-C  busPIRone (BUSPAR) 15 MG tablet Take 1 tablet (15 mg total) by mouth 3 (three) times daily. 01/27/19 01/27/20 Yes Plovsky, Berneta Sages, MD  cetirizine (ZYRTEC) 10 MG tablet Take 10 mg by mouth daily.   Yes [provider]  Cholecalciferol (VITAMIN D) 50 MCG (2000 UT) CAPS Take 2,000 Units by mouth daily.   Yes [provider]  cyclobenzaprine (FLEXERIL) 10 MG tablet TAKE 1 TABLET BY MOUTH 3 TIMES DAILY AS NEEDED. Patient taking differently: Take 10 mg by mouth at bedtime.  05/07/19  Yes Ladell Pier, MD  Dextromethorphan-guaiFENesin (WAL-TUSSIN DM PO)  Take 5 mLs by mouth 3 (three) times daily as needed (cough).   Yes [provider]  diphenhydramine-acetaminophen (TYLENOL PM) 25-500 MG TABS tablet Take 1 tablet by mouth at bedtime as needed (sleep).   Yes [provider]  doxepin (SINEQUAN) 50 MG capsule Take 1 capsule (50 mg total) by mouth at bedtime. 01/27/19 01/27/20 Yes Plovsky, Berneta Sages, MD  escitalopram (LEXAPRO) 20 MG tablet Take 1 tablet (20 mg total) by mouth daily. 01/27/19  Yes Plovsky, Berneta Sages, MD  fluticasone (FLONASE) 50 MCG/ACT nasal spray Place 2 sprays into both nostrils daily. Patient taking differently: Place 2 sprays into both nostrils at bedtime.  09/29/18  Yes Ladell Pier, MD  fluticasone furoate-vilanterol (BREO ELLIPTA) 200-25 MCG/INH AEPB Inhale 1 puff into the lungs daily. 03/25/19  Yes Freeman Caldron M, PA-C  furosemide (LASIX) 20 MG tablet Take 1 tablet (20 mg total) by mouth daily. 03/25/19  Yes McClung, Angela M, PA-C  gabapentin (NEURONTIN) 600 MG tablet TAKE 1 TABLET BY MOUTH 4 TIMES DAILY Patient taking differently: Take 600 mg by mouth 4 (four) times daily.  03/25/19  Yes McClung, Angela M, PA-C  ipratropium-albuterol (DUONEB) 0.5-2.5 (3) MG/3ML SOLN TAKE 3 MLS VIA NEBULIZATION EVERY 6 HOURS Patient taking differently: Take 3 mLs by nebulization every 6 (six) hours as needed (shortness of breath).  03/25/19  Yes McClung, Angela M, PA-C  labetalol (NORMODYNE) 100 MG tablet TAKE 1 TABLET BY MOUTH 2 (TWO) TIMES DAILY. Patient taking differently: Take 100 mg by mouth 2 (two) times daily.  03/25/19  Yes Argentina Donovan, PA-C  levothyroxine (SYNTHROID) 100 MCG tablet Take 1 tablet (100 mcg total) by mouth daily. 03/25/19  Yes McClung, Angela M, PA-C  losartan (COZAAR) 50 MG tablet Take 1.5 tablets (75 mg total) by mouth daily. Needs appointment with PCP. 03/25/19  Yes Argentina Donovan, PA-C  metFORMIN (GLUCOPHAGE) 500 MG tablet Take 1 tablet (500 mg total) by mouth 2 (two) times daily with a meal.  04/06/19  Yes McClung, Angela M, PA-C  mirtazapine (REMERON) 30 MG tablet Take 0.5 tablets (15 mg total) by mouth at bedtime. Patient taking differently: Take 30 mg by mouth at bedtime.  01/27/19 01/27/20 Yes Plovsky, Berneta Sages, MD  montelukast (SINGULAIR) 10 MG tablet TAKE 1 TABLET BY MOUTH AT BEDTIME. Patient taking differently: Take 10 mg by mouth at bedtime.  04/19/19  Yes Ladell Pier, MD  omeprazole (PRILOSEC) 40 MG capsule Take 1 capsule (40 mg total) by mouth daily. Patient taking differently: Take 40 mg by mouth at bedtime.  03/25/19  Yes Argentina Donovan, PA-C  ticagrelor (BRILINTA) 90 MG TABS tablet TAKE 1 TABLET BY MOUTH 2 (TWO) TIMES DAILY. Patient taking differently: Take 90 mg by mouth 2 (two) times daily.  03/25/19  Yes Freeman Caldron M, PA-C  triamcinolone cream (KENALOG) 0.1 % APPLY 1 APPLICATION TOPICALLY 2 TIMES DAILY. Patient taking differently: Apply 1 application topically 2 (two) times daily.  06/19/18  Yes Ladell Pier, MD  umeclidinium bromide (INCRUSE ELLIPTA) 62.5 MCG/INH AEPB Inhale 1 puff into the lungs daily. 03/25/19  Yes Argentina Donovan, PA-C  Vitamin E 180 MG CAPS Take 180 mg by mouth daily.   Yes [provider]  azithromycin (ZITHROMAX) 250 MG tablet Take 2 today then 1 daily Patient not taking: Reported on 05/19/2019 05/06/19   Argentina Donovan, PA-C  Blood Glucose Monitoring Suppl (TRUE METRIX METER) DEVI 1 kit by Does not apply route 4 (four) times daily. 04/04/17   Brayton Caves, PA-C  glucose blood (TRUE METRIX BLOOD GLUCOSE TEST) test strip Use as instructed 01/21/18   Ladell Pier, MD  loratadine (CLARITIN) 10 MG tablet Take 1 tablet (10 mg total) by mouth daily. Patient not taking: Reported on 05/19/2019 09/18/17   Ladell Pier, MD  ondansetron (ZOFRAN) 4 MG tablet Take 1 tablet (4 mg total) every 8 (eight) hours as needed by mouth for nausea or vomiting. Patient not taking: Reported on 05/19/2019 05/28/17   Elsie Stain, MD   predniSONE (DELTASONE) 10 MG tablet 6,5,4,3,2,1 take each days dose all at once in the morning Patient not taking: Reported on 05/19/2019 05/06/19   Argentina Donovan, PA-C  Sodium Chloride-Sodium Bicarb (NETI POT SINUS [place]) 2300-700 MG KIT Do nasal rinse three times a week as needed Patient not taking: Reported on 05/19/2019 09/17/18   Ladell Pier, MD  TRUEPLUS LANCETS 28G MISC 28 g by Does not apply route QID. 04/04/17   Brayton Caves, PA-C    Allergies as of 04/28/2019 - Review Complete 04/28/2019  Allergen Reaction Noted  . 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 Latex Shortness Of Breath and Rash 12/22/2008  . Codeine Nausea Only 12/22/2008    Family History  Problem Relation Age of Onset  . Heart disease Mother   . Hypertension Mother   . Stroke Mother   . Mental illness Mother   . Heart disease Father   . Hypertension Father   . Diabetes Father   . Cancer Maternal Aunt        breast  . Thyroid disease Paternal Aunt   . Heart disease Maternal Grandfather   . Anesthesia problems Daughter   . Cancer Paternal Grandmother        mouth  . Hypotension Neg Hx   . Malignant hyperthermia Neg Hx   . Pseudochol deficiency Neg Hx   . Colon cancer Neg Hx     Social History   Socioeconomic History  . Marital status: Divorced    Spouse name: Not on file  . Number of children: Not on file  . Years of education: Not on file  . Highest education level: Not on file  Occupational History  . Not on file  Social Needs  . Financial resource strain: Not hard at all  . Food insecurity    Worry: Never true    Inability: Never true  . Transportation needs    Medical: No    Non-medical: No  Tobacco Use  . Smoking status: Current Every Day Smoker    Packs/day: 1.00    Years: 50.00    Pack years: 50.00    Types: Cigarettes  . Smokeless tobacco: Never Used  Substance and Sexual Activity  . Alcohol use: Yes  Comment: once a week  . Drug use: No    Types: Cocaine    Comment:  has been years per pt  . Sexual activity: Not Currently    Birth control/protection: Surgical  Lifestyle  . Physical activity    Days per week: 7 days    Minutes per session: 10 min  . Stress: Only a little  Relationships  . Social Herbalist on phone: Not on file    Gets together: Not on file    Attends religious service: Not on file    Active member of club or organization: Not on file    Attends meetings of clubs or organizations: Not on file    Relationship status: Not on file  . Intimate partner violence    Fear of current or ex partner: Not on file    Emotionally abused: Not on file    Physically abused: Not on file    Forced sexual activity: Not on file  Other Topics Concern  . Not on file  Social History Narrative  . Not on file    Review of Systems: See HPI, otherwise negative ROS  Physical Exam: There were no vitals taken for this visit. General:   Alert,  Well-developed, well-nourished, pleasant and cooperative in NAD Mouth:  No deformity or lesions. Neck:  Supple; no masses or thyromegaly. No significant cervical adenopathy. Lungs:  Clear throughout to auscultation.   No wheezes, crackles, or rhonchi. No acute distress. Heart:  Regular rate and rhythm; no murmurs, clicks, rubs,  or gallops. Abdomen: Non-distended, normal bowel sounds.  Soft and nontender without appreciable mass or hepatosplenomegaly.  Pulses:  Normal pulses noted. Extremities:  Without clubbing or edema.  Impression/Plan: 63 year old lady with chronic GERD now with progressing esophageal dysphagia.  EGD now being done to further evaluate.  Esophageal dilation to be performed as feasible/appropriate per plan.  The risks, benefits, limitations, alternatives and imponderables have been reviewed with the patient. Potential for esophageal dilation, biopsy, etc. have also been reviewed.  Questions have been answered. All parties agreeable.     Notice: This dictation was prepared with  Dragon dictation along with smaller phrase technology. Any transcriptional errors that result from this process are unintentional and may not be corrected upon review.

## 2019-05-27 NOTE — Op Note (Signed)
[facility] Patient Name: [name] [name] Procedure Date: 05/27/2019 8:52 AM MRN: [id number] Date of Birth: [birth year] Attending MD: Norvel Richards , MD CSN: [id number] Age: 63 Admit Type: Outpatient Procedure:                Upper GI endoscopy Indications:              Dysphagia Providers:                Norvel Richards, MD, Charlsie Quest. Theda Sers RN, RN,                            Aram Candela Referring MD:              Medicines:                Propofol per Anesthesia Complications:            No immediate complications. Estimated Blood Loss:     Estimated blood loss was minimal. Estimated blood                            loss was minimal. Procedure:                Pre-Anesthesia Assessment:                           - Prior to the procedure, a History and Physical                            was performed, and patient medications and                            allergies were reviewed. The patient's tolerance of                            previous anesthesia was also reviewed. The risks                            and benefits of the procedure and the sedation                            options and risks were discussed with the patient.                            All questions were answered, and informed consent                            was obtained. Prior Anticoagulants: The patient has                            taken no previous anticoagulant or antiplatelet                            agents. ASA Grade Assessment: II - A patient with  mild systemic disease. After reviewing the risks                            and benefits, the patient was deemed in                            satisfactory condition to undergo the procedure.                           After obtaining informed consent, the endoscope was                            passed under direct vision. Throughout the                            procedure, the patient's blood pressure, pulse,  and                            oxygen saturations were monitored continuously. The                            [id number]) was introduced through the                            mouth, and advanced to the second part of duodenum.                            The upper GI endoscopy was accomplished without                            difficulty. The patient tolerated the procedure                            well. Scope In: 9:17:12 AM Scope Out: 9:24:54 AM Total Procedure Duration: 0 hours 7 minutes 42 seconds  Findings:      The examined esophagus was normal.      One localized 8 mm erosion with no stigmata of recent bleeding was found       in the gastric antrum.      The duodenal bulb and second portion of the duodenum were normal. The       scope was withdrawn. Dilation was performed with a Maloney dilator with       mild resistance at 51 Fr. The dilation site was examined and showed no       change. Estimated blood loss: none. Finally, biopsies of the eroded       gastric mucosa taken for histology Impression:               - Normal esophagus.                           - Erosive gastropathy with no stigmata of recent                            bleeding. Post biopsy                           -  Normal duodenal bulb and second portion of the                            duodenum. Moderate Sedation:      Moderate (conscious) sedation was personally administered by an       anesthesia professional. The following parameters were monitored: oxygen       saturation, heart rate, blood pressure, respiratory rate, EKG, adequacy       of pulmonary ventilation, and response to care. Recommendation:           - Patient has a contact number available for                            emergencies. The signs and symptoms of potential                            delayed complications were discussed with the                            patient. Return to normal activities tomorrow.                             Written discharge instructions were provided to the                            patient.                           - Advance diet as tolerated.                           - Continue present medications. Continue omeprazole                            40 mg daily.                           - Await pathology results.                           - Return to GI clinic in 3 months. Procedure Code(s):        --- Professional ---                           [phone], Esophagogastroduodenoscopy, flexible,                            transoral; diagnostic, including collection of                            specimen(s) by brushing or washing, when performed                            (separate procedure)                           [id number], Dilation of esophagus, by unguided  sound or                            bougie, single or multiple passes Diagnosis Code(s):        --- Professional ---                           K31.89, Other diseases of stomach and duodenum                           R13.10, Dysphagia, unspecified CPT copyright 2019 American Medical Association. All rights reserved. The codes documented in this report are preliminary and upon coder review may  be revised to meet current compliance requirements. [name]. [name], MD Norvel Richards, MD 05/27/2019 9:34:08 AM This report has been signed electronically. Number of Addenda: 0

## 2019-05-28 LAB — SURGICAL PATHOLOGY

## 2019-05-29 ENCOUNTER — Encounter: Payer: Self-pay | Admitting: Internal Medicine

## 2019-05-31 ENCOUNTER — Other Ambulatory Visit: Payer: Self-pay | Admitting: Internal Medicine

## 2019-05-31 MED FILL — ?FUROSEMIDE 20MG TABLET: 20 | 30 days supply | Qty: 30 | Fill #1

## 2019-05-31 MED FILL — !BRILINTA 90 MG TABLET: 30 days supply | Qty: 60 | Fill #1

## 2019-05-31 MED FILL — $BREO ELLIPTA 200-25 MCG IN: 200-25 | 30 days supply | Qty: 60 | Fill #1

## 2019-05-31 MED FILL — LABETALOL HCL 100 MG TABS: 100 | 30 days supply | Qty: 60 | Fill #1

## 2019-06-01 ENCOUNTER — Encounter (HOSPITAL_COMMUNITY): Payer: Self-pay | Admitting: Internal Medicine

## 2019-06-01 MED FILL — CYCLOBENZAPRINE 10 MG TAB: 10 | 10 days supply | Qty: 30 | Fill #0

## 2019-06-02 ENCOUNTER — Ambulatory Visit (INDEPENDENT_AMBULATORY_CARE_PROVIDER_SITE_OTHER): Payer: Medicaid Other | Admitting: Psychiatry

## 2019-06-02 ENCOUNTER — Other Ambulatory Visit: Payer: Self-pay

## 2019-06-02 DIAGNOSIS — F419 Anxiety disorder, unspecified: Secondary | ICD-10-CM

## 2019-06-02 DIAGNOSIS — F329 Major depressive disorder, single episode, unspecified: Secondary | ICD-10-CM

## 2019-06-02 DIAGNOSIS — F411 Generalized anxiety disorder: Secondary | ICD-10-CM

## 2019-06-02 DIAGNOSIS — F32A Depression, unspecified: Secondary | ICD-10-CM

## 2019-06-02 MED ORDER — BUSPIRONE HCL 15 MG PO TABS: 15.0000 mg | ORAL_TABLET | Freq: Three times a day (TID) | ORAL | 1 refills | Status: DC

## 2019-06-02 MED ORDER — MIRTAZAPINE 30 MG PO TABS: 30.0000 mg | ORAL_TABLET | Freq: Every day | ORAL | 1 refills | Status: DC

## 2019-06-02 MED ORDER — ESCITALOPRAM OXALATE 20 MG PO TABS: 20.0000 mg | ORAL_TABLET | Freq: Every day | ORAL | 2 refills | Status: DC

## 2019-06-02 NOTE — Progress Notes (Signed)
[facility] MD/PA/NP OP Progress Note  06/02/2019 3:12 PM [name] [name]  MRN:  [id number]  Chief Complaint: Anxiety HPI:  Today the patient is doing well.  She is just moved into a new apartment for the last 2 weeks.  She is very happy with it.  The patient is sleeping only fairly well and seems to benefit more by having a full dose of Remeron.  Today we will go ahead and increase it from 15 mg to 30.  The patient's anxiety is well controlled taking BuSpar.  Patient also takes Lexapro for generalized anxiety disorder.  Generally the patient is worrying much less.  She is sleeping fairly well she is got good ability to concentrate and think.  She has no muscle skeletal tension symptoms.  Patient is not fatigued.  She is functioning extremely well.  She is happy with life.  The patient is not suicidal.  She drinks no alcohol and uses no drugs.  The patient denies any chest pain shortness of breath or any neurological symptoms.. Visit Diagnosis:    ICD-10-CM   1. Depression, unspecified depression type  F32.9 mirtazapine (REMERON) 30 MG tablet  2. GAD (generalized anxiety disorder)  F41.1 mirtazapine (REMERON) 30 MG tablet    busPIRone (BUSPAR) 15 MG tablet  3. Anxiety and depression  F41.9 escitalopram (LEXAPRO) 20 MG tablet   F32.9     Past Psychiatric History: history of substance use, crack cocaine addiction, alcohol use disorder  Past Medical History:  Past Medical History:  Diagnosis Date  . Anxiety    takes Lexapro daily  . Arthritis    "back, from neck down pass my bra area" (03/25/2017)  . Asthma   . Bartholin gland cyst 08/29/2011  . Bruises easily    pt is on Effient  . Chronic back pain    herniated nucleus pulposus  . Chronic back pain    "neck to bra area; lower back" (03/25/2017)  . COPD (chronic obstructive pulmonary disease) ([place])    early stages  . Coronary artery disease   . Depression    takes Klonopin daily  . Diabetes mellitus without complication ([place])   .  Diverticulosis   . GERD (gastroesophageal reflux disease)    takes Nexium daily  . H/O hiatal hernia   . Heart attack ([place]) 2011  . Hemorrhoids   . Hernia   . Hyperlipidemia    takes Lipitor daily  . Hypertension    takes Losartan daily and Labetalol bid  . Hypothyroidism    takes Synthroid daily  . Insomnia    hydroxyzine prn  . Joint pain   . Pneumonia    "couple times" (03/25/2017)  . Pre-diabetes    "just found out 1 wk ago" (03/25/2017)  . Psoriasis    elbows,knees,back  . Shortness of breath    with exertion  . Slowing of urinary stream   . Stress incontinence     Past Surgical History:  Procedure Laterality Date  . ABDOMINAL EXPLORATION SURGERY  1977  . ABDOMINAL HYSTERECTOMY  1977   "left one of my ovaries"  . BACK SURGERY    . BIOPSY  05/27/2019   Procedure: BIOPSY;  Surgeon: Daneil Dolin, MD;  Location[facility];  Service: Endoscopy;;  . COLONOSCOPY  2011   Dr. Ardis Hughs: Mild diverticulosis, descending diminutive colon polyp (not retrieved), next colonoscopy 10 years  . CORONARY ANGIOPLASTY WITH STENT PLACEMENT  2011 X2   "regular stents didn't work; had to  go back in in ~ 1 month and put in medicated stents"  . DILATION AND CURETTAGE OF UTERUS    . ESOPHAGOGASTRODUODENOSCOPY    . ESOPHAGOGASTRODUODENOSCOPY (EGD) WITH PROPOFOL N/A 05/27/2019   Procedure: ESOPHAGOGASTRODUODENOSCOPY (EGD) WITH PROPOFOL;  Surgeon: Daneil Dolin, MD;  Location: [facility];  Service: Endoscopy;  Laterality: N/A;  11:15am-office rescheduled to 11/12 @ 9:30am  . LEFT HEART CATH AND CORONARY ANGIOGRAPHY N/A 03/26/2017   Procedure: LEFT HEART CATH AND CORONARY ANGIOGRAPHY;  Surgeon: Belva Crome, MD;  Location: [facility];  Service: Cardiovascular;  Laterality: N/A;  . LUMBAR LAMINECTOMY/DECOMPRESSION MICRODISCECTOMY  08/05/2011   Procedure: LUMBAR LAMINECTOMY/DECOMPRESSION MICRODISCECTOMY;  Surgeon: Otilio Connors, MD;  Location: [facility];  Service: Neurosurgery;   Laterality: Right;  Right Lumbar four-five extraforaminal discectomy  . MALONEY DILATION N/A 05/27/2019   Procedure: Venia Minks DILATION;  Surgeon: Daneil Dolin, MD;  Location: [facility];  Service: Endoscopy;  Laterality: N/A;  54  . TONSILLECTOMY     as a child    Family Psychiatric History: See intake H&P for full details. Reviewed, with no updates at this time.   Family History:  Family History  Problem Relation Age of Onset  . Heart disease Mother   . Hypertension Mother   . Stroke Mother   . Mental illness Mother   . Heart disease Father   . Hypertension Father   . Diabetes Father   . Cancer Maternal Aunt        breast  . Thyroid disease Paternal Aunt   . Heart disease Maternal Grandfather   . Anesthesia problems Daughter   . Cancer Paternal Grandmother        mouth  . Hypotension Neg Hx   . Malignant hyperthermia Neg Hx   . Pseudochol deficiency Neg Hx   . Colon cancer Neg Hx     Social History:  Social History   Socioeconomic History  . Marital status: Divorced    Spouse name: Not on file  . Number of children: Not on file  . Years of education: Not on file  . Highest education level: Not on file  Occupational History  . Not on file  Social Needs  . Financial resource strain: Not hard at all  . Food insecurity    Worry: Never true    Inability: Never true  . Transportation needs    Medical: No    Non-medical: No  Tobacco Use  . Smoking status: Current Every Day Smoker    Packs/day: 1.00    Years: 50.00    Pack years: 50.00    Types: Cigarettes  . Smokeless tobacco: Never Used  Substance and Sexual Activity  . Alcohol use: Yes    Comment: once a week  . Drug use: No    Types: Cocaine    Comment: has been years per pt  . Sexual activity: Not Currently    Birth control/protection: Surgical  Lifestyle  . Physical activity    Days per week: 7 days    Minutes per session: 10 min  . Stress: Only a little  Relationships  . Social Product manager on phone: Not on file    Gets together: Not on file    Attends religious service: Not on file    Active member of club or organization: Not on file    Attends meetings of clubs or organizations: Not on file    Relationship status: Not on file  Other Topics Concern  . Not on file  Social History Narrative  . Not on file    Allergies:  Allergies  Allergen Reactions  . Avocado Anaphylaxis  . Latex Shortness Of Breath and Rash  . Codeine Nausea Only    Metabolic Disorder Labs: Lab Results  Component Value Date   HGBA1C 6.1 (H) 04/05/2019   No results found for: PROLACTIN Lab Results  Component Value Date   CHOL 185 04/05/2019   TRIG 104 04/05/2019   HDL 67 04/05/2019   CHOLHDL 2.8 04/05/2019   VLDL 15 04/16/2016   LDLCALC 99 04/05/2019   LDLCALC 68 09/17/2018   Lab Results  Component Value Date   TSH 0.352 (L) 04/05/2019   TSH 12.130 (H) 09/17/2018    Therapeutic Level Labs: No results found for: LITHIUM No results found for: VALPROATE No components found for:  CBMZ  Current Medications: Current Outpatient Medications  Medication Sig Dispense Refill  . albuterol (VENTOLIN HFA) 108 (90 Base) MCG/ACT inhaler INHALE 2 PUFFS INTO THE LUNGS EVERY 6 (SIX) HOURS AS NEEDED FOR WHEEZING OR SHORTNESS OF BREATH. (Patient taking differently: Inhale 2 puffs into the lungs every 6 (six) hours as needed for wheezing or shortness of breath. ) 18 g 2  . aspirin EC 81 MG tablet Take 81 mg by mouth daily.    Marland Kitchen atorvastatin (LIPITOR) 40 MG tablet Take 1 tablet (40 mg total) by mouth at bedtime. 30 tablet 3  . benzonatate (TESSALON) 100 MG capsule Take 2 capsules (200 mg total) by mouth 3 (three) times daily as needed for cough. 40 capsule 0  . Blood Glucose Monitoring Suppl (TRUE METRIX METER) DEVI 1 kit by Does not apply route 4 (four) times daily. 1 Device 0  . busPIRone (BUSPAR) 15 MG tablet Take 1 tablet (15 mg total) by mouth 3 (three) times daily. 270 tablet 1  .  cetirizine (ZYRTEC) 10 MG tablet Take 10 mg by mouth daily.    . Cholecalciferol (VITAMIN D) 50 MCG (2000 UT) CAPS Take 2,000 Units by mouth daily.    . cyclobenzaprine (FLEXERIL) 10 MG tablet TAKE 1 TABLET BY MOUTH 3 TIMES DAILY AS NEEDED. 30 tablet 0  . Dextromethorphan-guaiFENesin (WAL-TUSSIN DM PO) Take 5 mLs by mouth 3 (three) times daily as needed (cough).    . diphenhydramine-acetaminophen (TYLENOL PM) 25-500 MG TABS tablet Take 1 tablet by mouth at bedtime as needed (sleep).    Marland Kitchen doxepin (SINEQUAN) 50 MG capsule Take 1 capsule (50 mg total) by mouth at bedtime. 30 capsule 2  . escitalopram (LEXAPRO) 20 MG tablet Take 1 tablet (20 mg total) by mouth daily. 30 tablet 2  . fluticasone (FLONASE) 50 MCG/ACT nasal spray Place 2 sprays into both nostrils daily. (Patient taking differently: Place 2 sprays into both nostrils at bedtime. ) 16 g 6  . fluticasone furoate-vilanterol (BREO ELLIPTA) 200-25 MCG/INH AEPB Inhale 1 puff into the lungs daily. 60 each 6  . furosemide (LASIX) 20 MG tablet Take 1 tablet (20 mg total) by mouth daily. 30 tablet 2  . gabapentin (NEURONTIN) 600 MG tablet TAKE 1 TABLET BY MOUTH 4 TIMES DAILY (Patient taking differently: Take 600 mg by mouth 4 (four) times daily. ) 120 tablet 3  . glucose blood (TRUE METRIX BLOOD GLUCOSE TEST) test strip Use as instructed 100 each 12  . ipratropium-albuterol (DUONEB) 0.5-2.5 (3) MG/3ML SOLN TAKE 3 MLS VIA NEBULIZATION EVERY 6 HOURS (Patient taking differently: Take 3 mLs by nebulization every 6 (six) hours  as needed (shortness of breath). ) 360 mL 1  . labetalol (NORMODYNE) 100 MG tablet TAKE 1 TABLET BY MOUTH 2 (TWO) TIMES DAILY. (Patient taking differently: Take 100 mg by mouth 2 (two) times daily. ) 60 tablet 2  . levothyroxine (SYNTHROID) 100 MCG tablet Take 1 tablet (100 mcg total) by mouth daily. 30 tablet 2  . losartan (COZAAR) 50 MG tablet Take 1.5 tablets (75 mg total) by mouth daily. Needs appointment with PCP. 45 tablet 3  .  metFORMIN (GLUCOPHAGE) 500 MG tablet Take 1 tablet (500 mg total) by mouth 2 (two) times daily with a meal. 180 tablet 3  . mirtazapine (REMERON) 30 MG tablet Take 1 tablet (30 mg total) by mouth at bedtime. 90 tablet 1  . montelukast (SINGULAIR) 10 MG tablet TAKE 1 TABLET BY MOUTH AT BEDTIME. (Patient taking differently: Take 10 mg by mouth at bedtime. ) 30 tablet 2  . omeprazole (PRILOSEC) 40 MG capsule Take 1 capsule (40 mg total) by mouth daily. (Patient taking differently: Take 40 mg by mouth at bedtime. ) 30 capsule 1  . ticagrelor (BRILINTA) 90 MG TABS tablet TAKE 1 TABLET BY MOUTH 2 (TWO) TIMES DAILY. (Patient taking differently: Take 90 mg by mouth 2 (two) times daily. ) 60 tablet 6  . triamcinolone cream (KENALOG) 0.1 % APPLY 1 APPLICATION TOPICALLY 2 TIMES DAILY. (Patient taking differently: Apply 1 application topically 2 (two) times daily. ) 30 g 1  . TRUEPLUS LANCETS 28G MISC 28 g by Does not apply route QID. 120 each 2  . umeclidinium bromide (INCRUSE ELLIPTA) 62.5 MCG/INH AEPB Inhale 1 puff into the lungs daily. 1 each 6  . Vitamin E 180 MG CAPS Take 180 mg by mouth daily.     No current facility-administered medications for this visit.     Musculoskeletal: Strength & Muscle Tone: within normal limits Gait & Station: normal Patient leans: N/A  Psychiatric Specialty Exam: ROS  There were no vitals taken for this visit.There is no height or weight on file to calculate BMI.  General Appearance: Casual and Fairly Groomed  Eye Contact:  Fair  Speech:  Clear and Coherent and Normal Rate  Volume:  Normal  Mood:  Anxious and Depressed  Affect:  Appropriate and Congruent  Thought Process:  Goal Directed and Descriptions of Associations: Intact  Orientation:  Full (Time, Place, and Person)  Thought Content: Logical   Suicidal Thoughts:  No  Homicidal Thoughts:  No  Memory:  Immediate;   Good  Judgement:  Fair  Insight:  Fair  Psychomotor Activity:  Normal  Concentration:   Concentration: Fair  Recall:  Rew of Knowledge: Fair  Language: Fair  Akathisia:  Negative  Handed:  Right  AIMS (if indicated): not done  Assets:  Communication Skills Desire for Improvement Housing  ADL's:  Intact  Cognition: WNL  Sleep:  Poor   Screenings: AUDIT     Admission (Discharged) from 07/10/2013 in [facility]  Alcohol Use Disorder Identification Test Final Score (AUDIT)  12    GAD-7     Office Visit from 12/25/2017 in [facility] Counselor from 12/22/2017 in [facility] Office Visit from 09/18/2017 in [facility] Visit from 04/04/2017 in [facility] Visit from 03/13/2017 in [facility]  Total GAD-7 Score  '18  21  12  7  ' 15  PHQ2-9     Office Visit from 03/25/2019 in [facility] Office Visit from 12/25/2017 in [facility] Counselor from 12/22/2017 in [facility] Office Visit from 09/18/2017 in [facility] Office Visit from 05/28/2017 in [facility]  PHQ-2 Total Score  0  '6  6  2  2  ' PHQ-9 Total Score  0  '24  23  8  9       ' Assessment and Plan:   This patient is diagnosed with generalized anxiety disorder.  The patient takes Lexapro 20 mg and does very well.  Her second problem is that of insomnia.  The patient takes Remeron which helps her depression but also helps her sleep.  Today we will just upped the dose to 30 mg.  The patient will continue taking BuSpar as prescribed.  The patient is not in therapy at this time.  She is doing very well and is very stable.  1. Depression, unspecified depression type   2. GAD (generalized anxiety disorder)   3. Anxiety and depression     Status of current problems: new to  [employer] Ordered: No orders of the defined types were placed in this encounter.   Labs Reviewed: na  Collateral Obtained/Records Reviewed: Reviewed notes from Dr. Adele Schilder  Plan:  Increase Lexapro to 20 mg Increase BuSpar to 15 mg 3 times daily Seroquel discontinued by patient given  restless legs and leg cramps Initiate Remeron 15 mg nightly Continue individual therapy and palliative/hospice grief counselor   Jerral Ralph, MD 06/02/2019, 3:12 PM

## 2019-06-03 MED FILL — ESCITALOPRAM 20 MG TABLET: 20 | 30 days supply | Qty: 30 | Fill #0

## 2019-06-03 MED FILL — ?MIRTAZAPINE 30 MG TABLET: 30 | 30 days supply | Qty: 30 | Fill #0

## 2019-06-07 ENCOUNTER — Ambulatory Visit (INDEPENDENT_AMBULATORY_CARE_PROVIDER_SITE_OTHER): Payer: Self-pay | Admitting: Licensed Clinical Social Worker

## 2019-06-07 ENCOUNTER — Other Ambulatory Visit: Payer: Self-pay

## 2019-06-07 ENCOUNTER — Encounter (HOSPITAL_COMMUNITY): Payer: Self-pay | Admitting: Licensed Clinical Social Worker

## 2019-06-07 DIAGNOSIS — F411 Generalized anxiety disorder: Secondary | ICD-10-CM

## 2019-06-07 NOTE — Progress Notes (Signed)
Virtual Visit via Phone Note  I connected with [name] [name] on 06/07/19 at 2:00pm by phone enabled telemedicine application and verified that I am speaking with the correct person using two identifiers. Pt does not have a computer or internet.   I discussed the limitations of evaluation and management by telemedicine and the availability of in person appointments. The patient expressed understanding and agreed to proceed.  History of Present Illness: Pt was referred to OP therapy by Psychiatrist, Dr. Casimiro Needle, for her anxiety, depression and Bipolar disorder.     Observations/Objective:  Pt presents for her therapy session by phone-enabled telemedicine. Pt discussed her psychiatric symptoms and current life events. Patient reports she has moved into her new apartment and is enjoying it. She discussed her feelings about living alone and leaving her daughter's home. Used CBT helping her to challenge her unhelpful thoughts.    Assessment and Plan: Pt continues to want weekly therapy by phone. She does not want to come into the office for therapy.   Follow Up Instructions:   I discussed the assessment and treatment plan with the patient. The patient was provided an opportunity to ask questions and all were answered. The patient agreed with the plan and demonstrated an understanding of the instructions.   The patient was advised to call back or seek an in-person evaluation if the symptoms worsen or if the condition fails to improve as anticipated.  I provided  45 minutes of non-face-to-face time during this encounter.   Broden Holt S, LCAS

## 2019-06-08 ENCOUNTER — Telehealth (HOSPITAL_COMMUNITY): Payer: Self-pay

## 2019-06-08 DIAGNOSIS — G4700 Insomnia, unspecified: Secondary | ICD-10-CM

## 2019-06-08 NOTE — Telephone Encounter (Signed)
Medication refill request - Fax received from [facility] for a refill of patient's past prescribed Doxepin 50 mg capsules, last filled 05/07/19.

## 2019-06-16 ENCOUNTER — Other Ambulatory Visit (HOSPITAL_COMMUNITY): Payer: Self-pay | Admitting: Psychiatry

## 2019-06-16 MED ORDER — DOXEPIN HCL 50 MG PO CAPS: 50.0000 mg | ORAL_CAPSULE | Freq: Every day | ORAL | 2 refills | Status: DC

## 2019-06-16 MED FILL — ?ATORVASTATIN 40MG TABLET: 40 | 30 days supply | Qty: 30 | Fill #1

## 2019-06-16 MED FILL — busPIRone HCL 15 MG TABS: 15 | 30 days supply | Qty: 90 | Fill #2

## 2019-06-16 MED FILL — LOSARTAN POTASSIUM 50 MG TA: 50 | 30 days supply | Qty: 45 | Fill #2

## 2019-06-16 MED FILL — DOXEPIN 50 MG CAPSULE: 50 | 30 days supply | Qty: 30 | Fill #0

## 2019-06-16 MED FILL — ESCITALOPRAM 20 MG TABLET: 20 | 30 days supply | Qty: 30 | Fill #2

## 2019-06-16 MED FILL — MONTELUKAST SOD 10 MG TAB: 10 | 30 days supply | Qty: 30 | Fill #2

## 2019-06-16 MED FILL — $VENTOLIN HFA 18G INHALER: 108 (90 BAS | 25 days supply | Qty: 18 | Fill #1

## 2019-06-16 MED FILL — LEVOTHYROXINE 100 MCG TAB: 100 | 30 days supply | Qty: 30 | Fill #2

## 2019-06-16 NOTE — Telephone Encounter (Signed)
New Doxepin 50 mg order + 2 refills e-scribed to patient's [facility] per Dr. Karen Chafe verbal order this date.

## 2019-06-21 ENCOUNTER — Encounter (HOSPITAL_COMMUNITY): Payer: Self-pay | Admitting: Licensed Clinical Social Worker

## 2019-06-21 ENCOUNTER — Other Ambulatory Visit: Payer: Self-pay

## 2019-06-21 ENCOUNTER — Ambulatory Visit (INDEPENDENT_AMBULATORY_CARE_PROVIDER_SITE_OTHER): Payer: Medicaid Other | Admitting: Licensed Clinical Social Worker

## 2019-06-21 DIAGNOSIS — F411 Generalized anxiety disorder: Secondary | ICD-10-CM

## 2019-06-21 NOTE — Progress Notes (Signed)
Virtual Visit via Phone Note  I connected with [name] [name] on 06/21/19 at 4:10pm by phone enabled telemedicine application and verified that I am speaking with the correct person using two identifiers. Pt does not have a computer or internet.   I discussed the limitations of evaluation and management by telemedicine and the availability of in person appointments. The patient expressed understanding and agreed to proceed.  History of Present Illness: Pt was referred to OP therapy by Psychiatrist, Dr. Casimiro Needle, for her anxiety, depression and Bipolar disorder.     Observations/Objective:  Pt presents for her therapy session by phone-enabled telemedicine. Pt discussed her psychiatric symptoms and current life events. Patient presents in pain. "I have a broken rib." She described what caused the broken rib and how it's affecting her. Used socratic questions. Patient's daughter is coming by this afternoon to discuss the incident.   Assessment and Plan: Pt continues to want weekly therapy by phone. She does not want to come into the office for therapy.   Follow Up Instructions:   I discussed the assessment and treatment plan with the patient. The patient was provided an opportunity to ask questions and all were answered. The patient agreed with the plan and demonstrated an understanding of the instructions.   The patient was advised to call back or seek an in-person evaluation if the symptoms worsen or if the condition fails to improve as anticipated.  I provided  25 minutes of non-face-to-face time during this encounter.   MACKENZIE,LISBETH S, LCAS

## 2019-06-30 ENCOUNTER — Ambulatory Visit: Payer: Medicaid Other | Attending: Internal Medicine

## 2019-06-30 ENCOUNTER — Other Ambulatory Visit: Payer: Self-pay

## 2019-06-30 DIAGNOSIS — Z20822 Contact with and (suspected) exposure to covid-19: Secondary | ICD-10-CM

## 2019-07-02 ENCOUNTER — Other Ambulatory Visit: Payer: Self-pay

## 2019-07-02 ENCOUNTER — Ambulatory Visit: Payer: Self-pay | Attending: Internal Medicine | Admitting: Internal Medicine

## 2019-07-02 ENCOUNTER — Encounter: Payer: Self-pay | Admitting: Internal Medicine

## 2019-07-02 DIAGNOSIS — J441 Chronic obstructive pulmonary disease with (acute) exacerbation: Secondary | ICD-10-CM

## 2019-07-02 DIAGNOSIS — F17200 Nicotine dependence, unspecified, uncomplicated: Secondary | ICD-10-CM

## 2019-07-02 DIAGNOSIS — Z20822 Contact with and (suspected) exposure to covid-19: Secondary | ICD-10-CM

## 2019-07-02 DIAGNOSIS — R05 Cough: Secondary | ICD-10-CM

## 2019-07-02 DIAGNOSIS — J449 Chronic obstructive pulmonary disease, unspecified: Secondary | ICD-10-CM

## 2019-07-02 DIAGNOSIS — Z20828 Contact with and (suspected) exposure to other viral communicable diseases: Secondary | ICD-10-CM

## 2019-07-02 DIAGNOSIS — R0981 Nasal congestion: Secondary | ICD-10-CM

## 2019-07-02 DIAGNOSIS — F17210 Nicotine dependence, cigarettes, uncomplicated: Secondary | ICD-10-CM

## 2019-07-02 LAB — NOVEL CORONAVIRUS, NAA: SARS-CoV-2, NAA: NOT DETECTED

## 2019-07-02 MED ORDER — BENZONATATE 100 MG PO CAPS: 200.0000 mg | ORAL_CAPSULE | Freq: Three times a day (TID) | ORAL | 1 refills | Status: DC | PRN

## 2019-07-02 MED ORDER — AZITHROMYCIN 250 MG PO TABS: ORAL_TABLET | ORAL | 0 refills | Status: DC

## 2019-07-02 MED ORDER — IPRATROPIUM-ALBUTEROL 0.5-2.5 (3) MG/3ML IN SOLN: RESPIRATORY_TRACT | 1 refills | Status: DC

## 2019-07-02 MED ORDER — PREDNISONE 10 MG PO TABS: ORAL_TABLET | ORAL | 0 refills | Status: DC

## 2019-07-02 MED FILL — BENZONATATE 100 MG CAPS: 100 | 6 days supply | Qty: 40 | Fill #0

## 2019-07-02 MED FILL — IPRAT-ALBUT 0.5-3(2.5) MG/3: 0.5-2.5 (3) | 30 days supply | Qty: 360 | Fill #0

## 2019-07-02 MED FILL — AZITHROMYCIN 250 MG TABLET: 250 | 5 days supply | Qty: 6 | Fill #0

## 2019-07-02 MED FILL — $INCRUSE ELLIPTA 62.5 MCG I: 62.5 MCG | 30 days supply | Qty: 30 | Fill #1

## 2019-07-02 MED FILL — predniSONE 10 MG TABS: 10 | 6 days supply | Qty: 18 | Fill #0

## 2019-07-02 MED FILL — $BREO ELLIPTA 200-25 MCG IN: 200-25 | 30 days supply | Qty: 60 | Fill #2

## 2019-07-02 NOTE — Progress Notes (Signed)
Virtual Visit via Telephone Note Due to current restrictions/limitations of in-office visits due to the COVID-19 pandemic, this scheduled clinical appointment was converted to a telehealth visit  I connected with [name] [name] on 07/02/19 at 12:07 p.m by telephone and verified that I am speaking with the correct person using two identifiers. I am in my office.  The patient is at home.  Only the patient and myself participated in this encounter.  I discussed the limitations, risks, security and privacy concerns of performing an evaluation and management service by telephone and the availability of in person appointments. I also discussed with the patient that there may be a patient responsible charge related to this service. The patient expressed understanding and agreed to proceed.   History of Present Illness: Patient with history ofpre-DM, GERD, hypothyroidism, HTN, HL, CAD with previous stent, COPD, substance abuse, depression/anxiety and tobacco dependence.  Today is an urgent care visit for respiratory symptoms.  Pt c/o congestion, coughing x few days.  + SOB, chest tight and wheezing.  Temp today is 99.7.  She thinks she also may have cracked a rib.  A friend had picked her up last week to crack her back and she felt like her rib had popped.  She has soreness of the rib on the left side close to the breast.  Did COVID test 2 days ago and await results.  -having to use neb 3 x a day -son-in-law has [place].  She has been around her daughter who does not have COVID -had flu vaccine Still smokes about 1/2 pk a day.  She has nicotine patches but not using consistently.    Outpatient Encounter Medications as of 07/02/2019  Medication Sig Note  . albuterol (VENTOLIN HFA) 108 (90 Base) MCG/ACT inhaler INHALE 2 PUFFS INTO THE LUNGS EVERY 6 (SIX) HOURS AS NEEDED FOR WHEEZING OR SHORTNESS OF BREATH. (Patient taking differently: Inhale 2 puffs into the lungs every 6 (six) hours as needed for wheezing  or shortness of breath. )   . aspirin EC 81 MG tablet Take 81 mg by mouth daily.   Marland Kitchen atorvastatin (LIPITOR) 40 MG tablet Take 1 tablet (40 mg total) by mouth at bedtime.   . benzonatate (TESSALON) 100 MG capsule Take 2 capsules (200 mg total) by mouth 3 (three) times daily as needed for cough. (Patient not taking: Reported on 07/02/2019)   . Blood Glucose Monitoring Suppl (TRUE METRIX METER) DEVI 1 kit by Does not apply route 4 (four) times daily.   . busPIRone (BUSPAR) 15 MG tablet Take 1 tablet (15 mg total) by mouth 3 (three) times daily.   . cetirizine (ZYRTEC) 10 MG tablet Take 10 mg by mouth daily.   . Cholecalciferol (VITAMIN D) 50 MCG (2000 UT) CAPS Take 2,000 Units by mouth daily.   . cyclobenzaprine (FLEXERIL) 10 MG tablet TAKE 1 TABLET BY MOUTH 3 TIMES DAILY AS NEEDED.   Marland Kitchen Dextromethorphan-guaiFENesin (WAL-TUSSIN DM PO) Take 5 mLs by mouth 3 (three) times daily as needed (cough).   . diphenhydramine-acetaminophen (TYLENOL PM) 25-500 MG TABS tablet Take 1 tablet by mouth at bedtime as needed (sleep).   Marland Kitchen doxepin (SINEQUAN) 50 MG capsule Take 1 capsule (50 mg total) by mouth at bedtime.   Marland Kitchen escitalopram (LEXAPRO) 20 MG tablet Take 1 tablet (20 mg total) by mouth daily.   . fluticasone (FLONASE) 50 MCG/ACT nasal spray Place 2 sprays into both nostrils daily. (Patient taking differently: Place 2 sprays into both nostrils at bedtime. )   .  fluticasone furoate-vilanterol (BREO ELLIPTA) 200-25 MCG/INH AEPB Inhale 1 puff into the lungs daily.   . furosemide (LASIX) 20 MG tablet Take 1 tablet (20 mg total) by mouth daily. 05/19/2019: Currently needs a refill   . gabapentin (NEURONTIN) 600 MG tablet TAKE 1 TABLET BY MOUTH 4 TIMES DAILY (Patient taking differently: Take 600 mg by mouth 4 (four) times daily. )   . glucose blood (TRUE METRIX BLOOD GLUCOSE TEST) test strip Use as instructed   . ipratropium-albuterol (DUONEB) 0.5-2.5 (3) MG/3ML SOLN TAKE 3 MLS VIA NEBULIZATION EVERY 6 HOURS (Patient  taking differently: Take 3 mLs by nebulization every 6 (six) hours as needed (shortness of breath). )   . labetalol (NORMODYNE) 100 MG tablet TAKE 1 TABLET BY MOUTH 2 (TWO) TIMES DAILY. (Patient taking differently: Take 100 mg by mouth 2 (two) times daily. )   . levothyroxine (SYNTHROID) 100 MCG tablet Take 1 tablet (100 mcg total) by mouth daily.   Marland Kitchen losartan (COZAAR) 50 MG tablet Take 1.5 tablets (75 mg total) by mouth daily. Needs appointment with PCP.   . metFORMIN (GLUCOPHAGE) 500 MG tablet Take 1 tablet (500 mg total) by mouth 2 (two) times daily with a meal.   . mirtazapine (REMERON) 30 MG tablet Take 1 tablet (30 mg total) by mouth at bedtime.   . montelukast (SINGULAIR) 10 MG tablet TAKE 1 TABLET BY MOUTH AT BEDTIME. (Patient taking differently: Take 10 mg by mouth at bedtime. )   . omeprazole (PRILOSEC) 40 MG capsule Take 1 capsule (40 mg total) by mouth daily. (Patient taking differently: Take 40 mg by mouth at bedtime. )   . ticagrelor (BRILINTA) 90 MG TABS tablet TAKE 1 TABLET BY MOUTH 2 (TWO) TIMES DAILY. (Patient taking differently: Take 90 mg by mouth 2 (two) times daily. )   . triamcinolone cream (KENALOG) 0.1 % APPLY 1 APPLICATION TOPICALLY 2 TIMES DAILY. (Patient taking differently: Apply 1 application topically 2 (two) times daily. )   . TRUEPLUS LANCETS 28G MISC 28 g by Does not apply route QID.   Marland Kitchen umeclidinium bromide (INCRUSE ELLIPTA) 62.5 MCG/INH AEPB Inhale 1 puff into the lungs daily.   . Vitamin E 180 MG CAPS Take 180 mg by mouth daily.    No facility-administered encounter medications on file as of 07/02/2019.      Observations/Objective: No direct observation done.  However patient sounded congested and was coughing on the phone.  Assessment and Plan: 1. COPD exacerbation ([place]) -Advised patient that it sounds as though she is having COPD exacerbation which could be due to Covid, flu or other respiratory virus. -She should be getting the results of her Covid test  today.  In the meantime I have given instructions of self quarantine.  However if shortness of breath is worse patient advised to be seen in the emergency room. - predniSONE (DELTASONE) 10 MG tablet; 4 tabs p.o. daily x2 days then 3 tabs daily for 2 days then 2 tabs daily for 2 days  Dispense: 18 tablet; Refill: 0 - azithromycin (ZITHROMAX) 250 MG tablet; 2 tabs p.o. x1 then 1 tab daily  Dispense: 6 tablet; Refill: 0 - benzonatate (TESSALON) 100 MG capsule; Take 2 capsules (200 mg total) by mouth 3 (three) times daily as needed for cough.  Dispense: 40 capsule; Refill: 1  2. COPD with chronic bronchitis ([place])  - ipratropium-albuterol (DUONEB) 0.5-2.5 (3) MG/3ML SOLN; TAKE 3 MLS VIA NEBULIZATION EVERY 6 HOURS  Dispense: 360 mL; Refill: 1  3. Exposure to  COVID-19 virus See #1 above.  4.  Tobacco dependence Strongly advised to discontinue smoking.  She knows that it is making her lungs worse.  She would like to quit.  Encouraged her to use the nicotine patches consistently.  Less than 5 minutes spent on counseling. Follow Up Instructions: 2 mth   I discussed the assessment and treatment plan with the patient. The patient was provided an opportunity to ask questions and all were answered. The patient agreed with the plan and demonstrated an understanding of the instructions.   The patient was advised to call back or seek an in-person evaluation if the symptoms worsen or if the condition fails to improve as anticipated.  I provided 12 minutes of non-face-to-face time during this encounter.   Karle Plumber, MD

## 2019-07-05 ENCOUNTER — Encounter (HOSPITAL_COMMUNITY): Payer: Self-pay | Admitting: Licensed Clinical Social Worker

## 2019-07-05 ENCOUNTER — Other Ambulatory Visit: Payer: Self-pay

## 2019-07-05 ENCOUNTER — Ambulatory Visit (INDEPENDENT_AMBULATORY_CARE_PROVIDER_SITE_OTHER): Payer: Medicaid Other | Admitting: Licensed Clinical Social Worker

## 2019-07-05 ENCOUNTER — Other Ambulatory Visit: Payer: Self-pay | Admitting: Physician Assistant

## 2019-07-05 DIAGNOSIS — K219 Gastro-esophageal reflux disease without esophagitis: Secondary | ICD-10-CM

## 2019-07-05 DIAGNOSIS — F411 Generalized anxiety disorder: Secondary | ICD-10-CM

## 2019-07-05 MED FILL — !BRILINTA 90 MG TABLET: 30 days supply | Qty: 60 | Fill #2

## 2019-07-05 MED FILL — ?METFORMIN HCL 500MG TABLET: 500 | 30 days supply | Qty: 60 | Fill #2

## 2019-07-05 MED FILL — LABETALOL HCL 100 MG TABS: 100 | 30 days supply | Qty: 60 | Fill #2

## 2019-07-05 MED FILL — ?MIRTAZAPINE 30 MG TABLET: 30 | 30 days supply | Qty: 30 | Fill #1

## 2019-07-05 NOTE — Progress Notes (Signed)
Virtual Visit via Phone Note  I connected with [name] [name] on 07/05/19 at 4:20pm by phone enabled telemedicine application and verified that I am speaking with the correct person using two identifiers. Pt does not have a computer or internet.   I discussed the limitations of evaluation and management by telemedicine and the availability of in person appointments. The patient expressed understanding and agreed to proceed.  History of Present Illness: Pt was referred to OP therapy by Psychiatrist, Dr. Casimiro Needle, for her anxiety, depression and Bipolar disorder.     Observations/Objective:  Pt presents for her therapy session by phone-enabled telemedicine. Pt discussed her psychiatric symptoms and current life events. Patient presents sad and tearful. "I'm lonely." Used socratic questions. Patient reports she is tired of Covid, tired of being in the house all the time, not having contact with others. Suggested to patient alternatives to do during a pandemic.   Assessment and Plan: Counselor will continue to meet with patient to address treatment plan goals. Patient will continue to follow recommendations of providers and implement skills learned in session.    Follow Up Instructions:   I discussed the assessment and treatment plan with the patient. The patient was provided an opportunity to ask questions and all were answered. The patient agreed with the plan and demonstrated an understanding of the instructions.   The patient was advised to call back or seek an in-person evaluation if the symptoms worsen or if the condition fails to improve as anticipated.  I provided  25 minutes of non-face-to-face time during this encounter.   Anyely Cunning S, LCAS

## 2019-07-06 MED FILL — OMEPRAZOLE DR 40 MG CAPSULE: 40 | 30 days supply | Qty: 30 | Fill #0

## 2019-07-20 ENCOUNTER — Other Ambulatory Visit (HOSPITAL_COMMUNITY): Payer: Medicaid Other

## 2019-07-20 ENCOUNTER — Other Ambulatory Visit: Payer: Self-pay

## 2019-07-20 ENCOUNTER — Ambulatory Visit (INDEPENDENT_AMBULATORY_CARE_PROVIDER_SITE_OTHER): Payer: Medicaid Other | Admitting: Licensed Clinical Social Worker

## 2019-07-20 ENCOUNTER — Encounter (HOSPITAL_COMMUNITY): Payer: Self-pay | Admitting: Licensed Clinical Social Worker

## 2019-07-20 DIAGNOSIS — F411 Generalized anxiety disorder: Secondary | ICD-10-CM

## 2019-07-20 NOTE — Progress Notes (Signed)
Virtual Visit via Phone Note  I connected with [name] [name] on 07/20/19 at 11:25am by phone enabled telemedicine application and verified that I am speaking with the correct person using two identifiers. Pt does not have a computer or internet.   I discussed the limitations of evaluation and management by telemedicine and the availability of in person appointments. The patient expressed understanding and agreed to proceed.  History of Present Illness: Pt was referred to OP therapy by Psychiatrist, Dr. Casimiro Needle, for her anxiety, depression and Bipolar disorder.     Observations/Objective:  Pt presents for her therapy session by phone-enabled telemedicine. Pt discussed her psychiatric symptoms and current life events. Patient discussed her holiday plans with family. Patient is less sad, tearful and depressed as she was at last session. Asked open ended questions. Patient discusses reasons of her sadness at the holidays. She is still grieving the death of her mother. Helped patient unpack her feelings of grief, moving forward with her life.    Assessment and Plan: Counselor will continue to meet with patient to address treatment plan goals. Patient will continue to follow recommendations of providers and implement skills learned in session.    Follow Up Instructions:   I discussed the assessment and treatment plan with the patient. The patient was provided an opportunity to ask questions and all were answered. The patient agreed with the plan and demonstrated an understanding of the instructions.   The patient was advised to call back or seek an in-person evaluation if the symptoms worsen or if the condition fails to improve as anticipated.  I provided  40 minutes of non-face-to-face time during this encounter.   Jaysion Ramseyer S, LCAS

## 2019-07-26 ENCOUNTER — Other Ambulatory Visit: Payer: Self-pay | Admitting: Internal Medicine

## 2019-07-26 MED FILL — FUROSEMIDE 20 MG TABS: 20 | 30 days supply | Qty: 30 | Fill #2

## 2019-07-26 MED FILL — busPIRone HCL 15 MG TABS: 15 | 30 days supply | Qty: 90 | Fill #3

## 2019-07-26 MED FILL — LEVOTHYROXINE 100 MCG TAB: 100 | 30 days supply | Qty: 30 | Fill #2

## 2019-07-26 MED FILL — ATORVASTATIN CALCIUM 40 MG: 40 | 30 days supply | Qty: 30 | Fill #2

## 2019-07-26 MED FILL — DOXEPIN 50 MG CAPSULE: 50 | 30 days supply | Qty: 30 | Fill #1

## 2019-07-26 MED FILL — LOSARTAN POTASSIUM 50 MG TA: 50 | 30 days supply | Qty: 45 | Fill #3

## 2019-07-26 MED FILL — $VENTOLIN HFA 18G INHALER: 108 (90 BAS | 25 days supply | Qty: 18 | Fill #2

## 2019-07-27 MED FILL — MONTELUKAST SOD 10 MG TAB: 10 | 30 days supply | Qty: 30 | Fill #0

## 2019-07-27 MED FILL — CYCLOBENZAPRINE 10 MG TAB: 10 | 10 days supply | Qty: 30 | Fill #0

## 2019-08-03 ENCOUNTER — Encounter (HOSPITAL_COMMUNITY): Payer: Self-pay | Admitting: Licensed Clinical Social Worker

## 2019-08-03 ENCOUNTER — Ambulatory Visit (INDEPENDENT_AMBULATORY_CARE_PROVIDER_SITE_OTHER): Payer: Medicaid Other | Admitting: Licensed Clinical Social Worker

## 2019-08-03 ENCOUNTER — Other Ambulatory Visit: Payer: Self-pay

## 2019-08-03 DIAGNOSIS — F411 Generalized anxiety disorder: Secondary | ICD-10-CM

## 2019-08-03 NOTE — Progress Notes (Signed)
Virtual Visit via Phone Note  I connected with [name] [name] on 08/03/19 at 11:10am by phone enabled telemedicine application and verified that I am speaking with the correct person using two identifiers. Pt does not have a computer or internet.   I discussed the limitations of evaluation and management by telemedicine and the availability of in person appointments. The patient expressed understanding and agreed to proceed.  History of Present Illness: Pt was referred to OP therapy by Psychiatrist, Dr. Casimiro Needle, for her anxiety, depression and Bipolar disorder.     Observations/Objective:  Pt presents for her therapy session by phone-enabled telemedicine. Pt discussed her psychiatric symptoms and current life events. Patient reports her moods have been somewhat stable but she continues to have COPD symptoms, struggling to go up/down stairs to smoke. She admits she is using the "patch" sometimes hoping to stop her cravings. Spent the session coaching patient on smoking cessation.      Assessment and Plan: Counselor will continue to meet with patient to address treatment plan goals. Patient will continue to follow recommendations of providers and implement skills learned in session.    Follow Up Instructions:   I discussed the assessment and treatment plan with the patient. The patient was provided an opportunity to ask questions and all were answered. The patient agreed with the plan and demonstrated an understanding of the instructions.   The patient was advised to call back or seek an in-person evaluation if the symptoms worsen or if the condition fails to improve as anticipated.  I provided  25 minutes of non-face-to-face time during this encounter.   Sorren Vallier S, LCAS

## 2019-08-05 ENCOUNTER — Other Ambulatory Visit: Payer: Self-pay | Admitting: Physician Assistant

## 2019-08-05 DIAGNOSIS — I10 Essential (primary) hypertension: Secondary | ICD-10-CM

## 2019-08-05 MED FILL — $INCRUSE ELLIPTA 62.5 MCG I: 62.5 MCG | 90 days supply | Qty: 90 | Fill #2

## 2019-08-05 MED FILL — ?MIRTAZAPINE 30 MG TABLET: 30 | 30 days supply | Qty: 30 | Fill #2

## 2019-08-05 MED FILL — $BRILINTA 90 MG TABLET: 90 | 90 days supply | Qty: 180 | Fill #3

## 2019-08-05 MED FILL — ?METFORMIN HCL 500MG TABLET: 500 | 30 days supply | Qty: 60 | Fill #3

## 2019-08-05 MED FILL — $VENTOLIN HFA 18G INHALER: 108 (90 BAS | 25 days supply | Qty: 18 | Fill #1

## 2019-08-05 MED FILL — !BREO ELLIPTA 200-25 MCG: 200-25 | 30 days supply | Qty: 60 | Fill #3

## 2019-08-05 MED FILL — CYCLOBENZAPRINE 10 MG TAB: 10 | 10 days supply | Qty: 30 | Fill #1

## 2019-08-05 MED FILL — OMEPRAZOLE DR 40 MG CAPSULE: 40 | 30 days supply | Qty: 30 | Fill #1

## 2019-08-06 MED FILL — LOSARTAN POTASSIUM 50 MG TA: 50 | 30 days supply | Qty: 45 | Fill #0

## 2019-08-06 MED FILL — LABETALOL HCL 100 MG TABS: 100 | 30 days supply | Qty: 60 | Fill #0

## 2019-08-17 ENCOUNTER — Encounter (HOSPITAL_COMMUNITY): Payer: Self-pay | Admitting: Licensed Clinical Social Worker

## 2019-08-17 ENCOUNTER — Ambulatory Visit (INDEPENDENT_AMBULATORY_CARE_PROVIDER_SITE_OTHER): Payer: Medicaid Other | Admitting: Licensed Clinical Social Worker

## 2019-08-17 ENCOUNTER — Other Ambulatory Visit: Payer: Self-pay

## 2019-08-17 ENCOUNTER — Telehealth: Payer: Self-pay | Admitting: Internal Medicine

## 2019-08-17 DIAGNOSIS — F411 Generalized anxiety disorder: Secondary | ICD-10-CM

## 2019-08-17 NOTE — Progress Notes (Signed)
Virtual Visit via Phone Note  I connected with [name] [name] on 08/17/19 at 11:00 am by phone enabled telemedicine application and verified that I am speaking with the correct person using two identifiers. Pt does not have a computer or internet.   I discussed the limitations of evaluation and management by telemedicine and the availability of in person appointments. The patient expressed understanding and agreed to proceed.  History of Present Illness: Pt was referred to OP therapy by Psychiatrist, Dr. Casimiro Needle, for her anxiety, depression and Bipolar disorder.     Observations/Objective:  Pt presents sad and depressed for her therapy session by phone-enabled telemedicine. Pt discussed her psychiatric symptoms and current life events. Patient was tearful upon presentation for her individual therapy session. Her patient of 10 years died of Covid. "I'm devastated." Patient described her relationship with her patient, not only as a caretaker but as a friend. Patient is trying to decided about attending the funeral. Assisted patient with risks vs benefits of attending the funeral in person. Suggested patient have additional therapy sessions during her time of grieving.   Assessment and Plan: Counselor will continue to meet with patient to address treatment plan goals. Patient will continue to follow recommendations of providers and implement skills learned in session.    Follow Up Instructions:   I discussed the assessment and treatment plan with the patient. The patient was provided an opportunity to ask questions and all were answered. The patient agreed with the plan and demonstrated an understanding of the instructions.   The patient was advised to call back or seek an in-person evaluation if the symptoms worsen or if the condition fails to improve as anticipated.  I provided  45 minutes of non-face-to-face time during this encounter.   Duff Pozzi S, LCAS

## 2019-08-17 NOTE — Telephone Encounter (Signed)
Will route to triage.

## 2019-08-17 NOTE — Telephone Encounter (Signed)
Pt called to request a call back from her nurse, she states every time she blows her nose she bleeds. She states she has a history of COPD and would just like a call back before her appt on 09/06/2019. Please advise

## 2019-08-18 NOTE — Telephone Encounter (Signed)
Called pt at  [phone]/ left voice message to call back. -

## 2019-08-20 ENCOUNTER — Ambulatory Visit: Payer: Self-pay | Attending: Family Medicine | Admitting: Family Medicine

## 2019-08-20 ENCOUNTER — Ambulatory Visit: Payer: Medicaid Other | Attending: Internal Medicine

## 2019-08-20 ENCOUNTER — Other Ambulatory Visit: Payer: Self-pay

## 2019-08-20 ENCOUNTER — Encounter: Payer: Self-pay | Admitting: Family Medicine

## 2019-08-20 DIAGNOSIS — J441 Chronic obstructive pulmonary disease with (acute) exacerbation: Secondary | ICD-10-CM

## 2019-08-20 DIAGNOSIS — Z20822 Contact with and (suspected) exposure to covid-19: Secondary | ICD-10-CM

## 2019-08-20 DIAGNOSIS — R110 Nausea: Secondary | ICD-10-CM

## 2019-08-20 DIAGNOSIS — J988 Other specified respiratory disorders: Secondary | ICD-10-CM

## 2019-08-20 DIAGNOSIS — J45901 Unspecified asthma with (acute) exacerbation: Secondary | ICD-10-CM

## 2019-08-20 DIAGNOSIS — B9789 Other viral agents as the cause of diseases classified elsewhere: Secondary | ICD-10-CM

## 2019-08-20 MED ORDER — PREDNISONE 10 MG PO TABS: ORAL_TABLET | ORAL | 0 refills | Status: DC

## 2019-08-20 MED ORDER — ONDANSETRON HCL 4 MG PO TABS: 4.0000 mg | ORAL_TABLET | Freq: Three times a day (TID) | ORAL | 0 refills | Status: DC | PRN

## 2019-08-20 MED ORDER — DOXYCYCLINE HYCLATE 100 MG PO TABS: 100.0000 mg | ORAL_TABLET | Freq: Two times a day (BID) | ORAL | 0 refills | Status: DC

## 2019-08-20 MED FILL — ?PREDINSONE 10MG TABLETS: 10 | 6 days supply | Qty: 18 | Fill #0

## 2019-08-20 MED FILL — ?DOXYCYCLINE HYCLATE 100MG: 100 | 10 days supply | Qty: 20 | Fill #0

## 2019-08-20 MED FILL — ?ONDANSETRON HCL 4 MG TABLE: 4 | 6 days supply | Qty: 20 | Fill #0

## 2019-08-20 NOTE — Telephone Encounter (Addendum)
Pt was seen via televisit today by MD Fulp. Concerns were addressed.

## 2019-08-20 NOTE — Progress Notes (Signed)
Virtual Visit via Telephone Note  I connected with [name] [name] on 08/20/19 at  9:50 AM EST by telephone and verified that I am speaking with the correct person using two identifiers.   I discussed the limitations, risks, security and privacy concerns of performing an evaluation and management service by telephone and the availability of in person appointments. I also discussed with the patient that there may be a patient responsible charge related to this service. The patient expressed understanding and agreed to proceed.  Patient Location: Home Provider Location: [facility] Others participating in call: none   History of Present Illness:       64 yo female who reports that she has had worsening of her COPD and asthma symptoms.  She reports a recurrent, cough that is productive of white thick sputum.  She denies fever or chills.  She denies any headache but has had mild dizziness.  She does not have any body aches.  She does not believe that she has come in contact with anybody from Covid as she has tried to stay away from other people.  She denies any abdominal pain-no diarrhea or constipation.  She has had some mild nausea which started today.  She also has had increased use of her albuterol nebulizer and she continues to take her chronic medications.  She has noticed increased shortness of breath as well as wheezing.  She states that she lives on the second floor and has to stop to catch her breath before she can make it entirely up the stairs over the past few days.  She has had a mild sore throat as well as onset of nasal congestion with clear discharge.  She denies any chest pain or palpitations.   Past Medical History:  Diagnosis Date  . Anxiety    takes Lexapro daily  . Arthritis    "back, from neck down pass my bra area" (03/25/2017)  . Asthma   . Bartholin gland cyst 08/29/2011  . Bruises easily    pt is on Effient  . Chronic back pain    herniated nucleus pulposus  . Chronic  back pain    "neck to bra area; lower back" (03/25/2017)  . COPD (chronic obstructive pulmonary disease) ([place])    early stages  . Coronary artery disease   . Depression    takes Klonopin daily  . Diabetes mellitus without complication ([place])   . Diverticulosis   . GERD (gastroesophageal reflux disease)    takes Nexium daily  . H/O hiatal hernia   . Heart attack ([place]) 2011  . Hemorrhoids   . Hernia   . Hyperlipidemia    takes Lipitor daily  . Hypertension    takes Losartan daily and Labetalol bid  . Hypothyroidism    takes Synthroid daily  . Insomnia    hydroxyzine prn  . Joint pain   . Pneumonia    "couple times" (03/25/2017)  . Pre-diabetes    "just found out 1 wk ago" (03/25/2017)  . Psoriasis    elbows,knees,back  . Shortness of breath    with exertion  . Slowing of urinary stream   . Stress incontinence     Past Surgical History:  Procedure Laterality Date  . ABDOMINAL EXPLORATION SURGERY  1977  . ABDOMINAL HYSTERECTOMY  1977   "left one of my ovaries"  . BACK SURGERY    . BIOPSY  05/27/2019   Procedure: BIOPSY;  Surgeon: Daneil Dolin, MD;  [facility];  Service: Endoscopy;;  . COLONOSCOPY  2011   Dr. Ardis Hughs: Mild diverticulosis, descending diminutive colon polyp (not retrieved), next colonoscopy 10 years  . CORONARY ANGIOPLASTY WITH STENT PLACEMENT  2011 X2   "regular stents didn't work; had to go back in in ~ 1 month and put in medicated stents"  . DILATION AND CURETTAGE OF UTERUS    . ESOPHAGOGASTRODUODENOSCOPY    . ESOPHAGOGASTRODUODENOSCOPY (EGD) WITH PROPOFOL N/A 05/27/2019   Procedure: ESOPHAGOGASTRODUODENOSCOPY (EGD) WITH PROPOFOL;  Surgeon: Daneil Dolin, MD;  [facility];  Service: Endoscopy;  Laterality: N/A;  11:15am-office rescheduled to 11/12 @ 9:30am  . LEFT HEART CATH AND CORONARY ANGIOGRAPHY N/A 03/26/2017   Procedure: LEFT HEART CATH AND CORONARY ANGIOGRAPHY;  Surgeon: Belva Crome, MD;  Location: [facility];   Service: Cardiovascular;  Laterality: N/A;  . LUMBAR LAMINECTOMY/DECOMPRESSION MICRODISCECTOMY  08/05/2011   Procedure: LUMBAR LAMINECTOMY/DECOMPRESSION MICRODISCECTOMY;  Surgeon: Otilio Connors, MD;  Location: [place] [facility];  Service: Neurosurgery;  Laterality: Right;  Right Lumbar four-five extraforaminal discectomy  . MALONEY DILATION N/A 05/27/2019   Procedure: Venia Minks DILATION;  Surgeon: Daneil Dolin, MD;  [facility]: [facility];  Service: Endoscopy;  Laterality: N/A;  54  . TONSILLECTOMY     as a child    Family History  Problem Relation Age of Onset  . Heart disease Mother   . Hypertension Mother   . Stroke Mother   . Mental illness Mother   . Heart disease Father   . Hypertension Father   . Diabetes Father   . Cancer Maternal Aunt        breast  . Thyroid disease Paternal Aunt   . Heart disease Maternal Grandfather   . Anesthesia problems Daughter   . Cancer Paternal Grandmother        mouth  . Hypotension Neg Hx   . Malignant hyperthermia Neg Hx   . Pseudochol deficiency Neg Hx   . Colon cancer Neg Hx     Social History   Tobacco Use  . Smoking status: Current Every Day Smoker    Packs/day: 1.00    Years: 50.00    Pack years: 50.00    Types: Cigarettes  . Smokeless tobacco: Never Used  Substance Use Topics  . Alcohol use: Yes    Comment: once a week  . Drug use: No    Types: Cocaine    Comment: has been years per pt     Allergies  Allergen Reactions  . Avocado Anaphylaxis  . Latex Shortness Of Breath and Rash  . Codeine Nausea Only       Observations/Objective: No vital signs or physical exam conducted as visit was done via telephone Observations-patient is able to speak in full sentences but has a raspy voice and occasional recurrent coughing episodes.  Assessment and Plan: 1. COPD with acute exacerbation (HCC);2.  Asthma exacerbation Patient with COPD and asthma exacerbation and will place patient on doxycycline 100 mg daily x10 days along with  prednisone taper.  Continue home respiratory medications.  Patient is advised to go to the emergency department if she has increased difficulty with work of breathing/increased shortness of breath, chest pain or any other concerns. - doxycycline (VIBRA-TABS) 100 MG tablet; Take 1 tablet (100 mg total) by mouth 2 (two) times daily.  Dispense: 20 tablet; Refill: 0 - predniSONE (DELTASONE) 10 MG tablet; 4 tabs p.o. daily x2 days then 3 tabs daily for 2 days then 2 tabs daily for  2 days  Dispense: 18 tablet; Refill: 0  3. Viral respiratory illness Patient with complaint of onset of sore throat and nasal congestion suggestive of viral illness and because patient also with respiratory disease, advised patient that I would like to arrange for her to have testing for COVID-19 as well and to go to the emergency department if she has any worsening of her current symptoms.  4.  Nausea Prescription will be provided for Zofran 4 mg to take every 6-8 hours as needed for nausea.  Patient should remain well-hydrated. - ondansetron (ZOFRAN) 4 MG tablet; Take 1 tablet (4 mg total) by mouth every 8 (eight) hours as needed for nausea or vomiting.  Dispense: 20 tablet; Refill: 0  Follow Up Instructions: go to ED if symptoms worsen; 1 week follow-up with PCP    I discussed the assessment and treatment plan with the patient. The patient was provided an opportunity to ask questions and all were answered. The patient agreed with the plan and demonstrated an understanding of the instructions.   The patient was advised to call back or seek an in-person evaluation if the symptoms worsen or if the condition fails to improve as anticipated.  I provided 8 minutes of non-face-to-face time during this encounter.   Antony Blackbird, MD

## 2019-08-20 NOTE — Progress Notes (Signed)
Patient has been called and DOB has been verified. Patient has been screened and transferred to PCP to start phone visit.   Coughing and sneezing.  Started with a sore throat.

## 2019-08-22 LAB — NOVEL CORONAVIRUS, NAA: SARS-CoV-2, NAA: NOT DETECTED

## 2019-08-24 ENCOUNTER — Ambulatory Visit (INDEPENDENT_AMBULATORY_CARE_PROVIDER_SITE_OTHER): Payer: Medicaid Other | Admitting: Licensed Clinical Social Worker

## 2019-08-24 ENCOUNTER — Other Ambulatory Visit: Payer: Self-pay

## 2019-08-24 ENCOUNTER — Encounter (HOSPITAL_COMMUNITY): Payer: Self-pay | Admitting: Licensed Clinical Social Worker

## 2019-08-24 DIAGNOSIS — F411 Generalized anxiety disorder: Secondary | ICD-10-CM

## 2019-08-24 DIAGNOSIS — F329 Major depressive disorder, single episode, unspecified: Secondary | ICD-10-CM

## 2019-08-24 DIAGNOSIS — F32A Depression, unspecified: Secondary | ICD-10-CM

## 2019-08-24 NOTE — Progress Notes (Signed)
Virtual Visit via Phone Note  I connected with [name] [name] on 08/24/19 at 11:00 am by phone enabled telemedicine application and verified that I am speaking with the correct person using two identifiers. Pt does not have a computer or internet.   I discussed the limitations of evaluation and management by telemedicine and the availability of in person appointments. The patient expressed understanding and agreed to proceed.  History of Present Illness: Pt was referred to OP therapy by Psychiatrist, Dr. Casimiro Needle, for her anxiety, depression and Bipolar disorder.     Observations/Objective:  Pt presents sad and depressed for her therapy session by phone-enabled telemedicine. Pt discussed her psychiatric symptoms and current life events. Patient was sick upon presentation for her individual therapy session. Her COPD has Exacerbated with bronchitis. She was struggling to breathe during session. Discussed smoking cessation. Patient is interested in a prescription for Wellbutrin, having used in the past for smoking cessation. Reached out to Dr. Karen Chafe nurse inquiring about Wellbutrin. Patient was able to describe her current feelings of grief. Gave patient platform to talk about her grief. Discussed Covid vaccine, suggested patient call her PCP to inquire if she is eligible for the vaccine now.   Assessment and Plan: Counselor will continue to meet with patient to address treatment plan goals. Patient will continue to follow recommendations of providers and implement skills learned in session.    Follow Up Instructions:  Counselor discussed the assessment and treatment plan with the patient. The patient was provided an opportunity to ask questions and all were answered. The patient agreed with the plan and demonstrated an understanding of the instructions.   The patient was advised to call back or seek an in-person evaluation if the symptoms worsen or if the condition fails to improve as  anticipated.  I provided  45 minutes of non-face-to-face time during this encounter.   [name] [name], LCAS

## 2019-08-25 ENCOUNTER — Telehealth (HOSPITAL_COMMUNITY): Payer: Self-pay

## 2019-08-25 NOTE — Telephone Encounter (Signed)
Patient's counselor sent a message stating that the patient is interested in trying to quit smoking. Patient is getting patches from [facility]. Patient stated she has stopped smoking in the past with the help of Wellbutrin. Can you prescribe this to the patient or should she talk to her PCP at [facility]. Please review and advise. Thank you.

## 2019-08-30 ENCOUNTER — Ambulatory Visit: Payer: Medicaid Other | Admitting: Gastroenterology

## 2019-08-30 NOTE — Progress Notes (Signed)
Referring Provider: Ladell Pier, MD Primary Care Physician:  Ladell Pier, MD  Primary GI: Dr. Gala Romney   Chief Complaint  Patient presents with  . Follow-up    FU from EGD,still having trouble swallowing    HPI:   [name] [name] is a 64 y.o. female presenting today in follow-up with history of dysphagia, epigastric/RUQ pain, s/p EGD Nov 2020 with normal esophagus, dilation, erosive gastropathy s/p biopsy, normal duodenum. Negative H.pylori. Routine screening colonoscopy due Oct 2021.   No improvement with dysphagia. Notes cornbread, vinegar, are worse. No rhyme or reason. Will feel choked. No soft food dysphagia. Notes liquid dysphagia. Pills will get stuck but takes all at one time. Omeprazole 40 mg daily. Sometimes breakthrough GERD. Edentulous and doesn't wear dentures while eating. Dentures not fitting well. Epigastric pain is chronic. RUQ Pain at times. Nausea at times. Sometimes worsened with eating. Present for the past year. No weight loss.   No rectal bleeding. Once in a "blue moon" may get diarrhea. Not consistent.   Past Medical History:  Diagnosis Date  . Anxiety    takes Lexapro daily  . Arthritis    "back, from neck down pass my bra area" (03/25/2017)  . Asthma   . Bartholin gland cyst 08/29/2011  . Bruises easily    pt is on Effient  . Chronic back pain    herniated nucleus pulposus  . Chronic back pain    "neck to bra area; lower back" (03/25/2017)  . COPD (chronic obstructive pulmonary disease) ([place])    early stages  . Coronary artery disease   . Depression    takes Klonopin daily  . Diabetes mellitus without complication ([place])   . Diverticulosis   . GERD (gastroesophageal reflux disease)    takes Nexium daily  . H/O hiatal hernia   . Heart attack ([place]) 2011  . Hemorrhoids   . Hernia   . Hyperlipidemia    takes Lipitor daily  . Hypertension    takes Losartan daily and Labetalol bid  . Hypothyroidism    takes Synthroid daily  .  Insomnia    hydroxyzine prn  . Joint pain   . Pneumonia    "couple times" (03/25/2017)  . Pre-diabetes    "just found out 1 wk ago" (03/25/2017)  . Psoriasis    elbows,knees,back  . Shortness of breath    with exertion  . Slowing of urinary stream   . Stress incontinence     Past Surgical History:  Procedure Laterality Date  . ABDOMINAL EXPLORATION SURGERY  1977  . ABDOMINAL HYSTERECTOMY  1977   "left one of my ovaries"  . BACK SURGERY    . BIOPSY  05/27/2019   Procedure: BIOPSY;  Surgeon: Daneil Dolin, MD;  [facility];  Service: Endoscopy;;  . COLONOSCOPY  2011   Dr. Ardis Hughs: Mild diverticulosis, descending diminutive colon polyp (not retrieved), next colonoscopy 10 years  . CORONARY ANGIOPLASTY WITH STENT PLACEMENT  2011 X2   "regular stents didn't work; had to go back in in ~ 1 month and put in medicated stents"  . DILATION AND CURETTAGE OF UTERUS    . ESOPHAGOGASTRODUODENOSCOPY    . ESOPHAGOGASTRODUODENOSCOPY (EGD) WITH PROPOFOL N/A 05/27/2019   normal esophagus, dilation, erosive gastropathy s/p biopsy, normal duodenum. Negative H.pylori.   Marland Kitchen LEFT HEART CATH AND CORONARY ANGIOGRAPHY N/A 03/26/2017   Procedure: LEFT HEART CATH AND CORONARY ANGIOGRAPHY;  Surgeon: Belva Crome, MD;  Location: [facility]  INVASIVE CV LAB;  Service: Cardiovascular;  Laterality: N/A;  . LUMBAR LAMINECTOMY/DECOMPRESSION MICRODISCECTOMY  08/05/2011   Procedure: LUMBAR LAMINECTOMY/DECOMPRESSION MICRODISCECTOMY;  Surgeon: Otilio Connors, MD;  Location: [facility] ORS;  Service: Neurosurgery;  Laterality: Right;  Right Lumbar four-five extraforaminal discectomy  . MALONEY DILATION N/A 05/27/2019   Procedure: Venia Minks DILATION;  Surgeon: Daneil Dolin, MD;  Location: [facility];  Service: Endoscopy;  Laterality: N/A;  54  . TONSILLECTOMY     as a child    Current Outpatient Medications  Medication Sig Dispense Refill  . albuterol (VENTOLIN HFA) 108 (90 Base) MCG/ACT inhaler INHALE 2 PUFFS  INTO THE LUNGS EVERY 6 (SIX) HOURS AS NEEDED FOR WHEEZING OR SHORTNESS OF BREATH. (Patient taking differently: Inhale 2 puffs into the lungs every 6 (six) hours as needed for wheezing or shortness of breath. ) 18 g 2  . aspirin EC 81 MG tablet Take 81 mg by mouth daily.    Marland Kitchen atorvastatin (LIPITOR) 40 MG tablet Take 1 tablet (40 mg total) by mouth at bedtime. 30 tablet 3  . Blood Glucose Monitoring Suppl (TRUE METRIX METER) DEVI 1 kit by Does not apply route 4 (four) times daily. 1 Device 0  . busPIRone (BUSPAR) 15 MG tablet Take 1 tablet (15 mg total) by mouth 3 (three) times daily. 270 tablet 1  . cetirizine (ZYRTEC) 10 MG tablet Take 10 mg by mouth daily.    . Cholecalciferol (VITAMIN D) 50 MCG (2000 UT) CAPS Take 2,000 Units by mouth daily.    . cyclobenzaprine (FLEXERIL) 10 MG tablet TAKE 1 TABLET BY MOUTH 3 TIMES DAILY AS NEEDED. (Patient taking differently: as needed. ) 30 tablet 1  . Dextromethorphan-guaiFENesin (WAL-TUSSIN DM PO) Take 5 mLs by mouth 3 (three) times daily as needed (cough).    . diphenhydramine-acetaminophen (TYLENOL PM) 25-500 MG TABS tablet Take 1 tablet by mouth at bedtime as needed (sleep).    Marland Kitchen doxepin (SINEQUAN) 50 MG capsule Take 1 capsule (50 mg total) by mouth at bedtime. 30 capsule 2  . escitalopram (LEXAPRO) 20 MG tablet Take 1 tablet (20 mg total) by mouth daily. 30 tablet 2  . fluticasone (FLONASE) 50 MCG/ACT nasal spray Place 2 sprays into both nostrils daily. (Patient taking differently: Place 2 sprays into both nostrils at bedtime. ) 16 g 6  . fluticasone furoate-vilanterol (BREO ELLIPTA) 200-25 MCG/INH AEPB Inhale 1 puff into the lungs daily. 60 each 6  . furosemide (LASIX) 20 MG tablet Take 1 tablet (20 mg total) by mouth daily. 30 tablet 2  . gabapentin (NEURONTIN) 600 MG tablet TAKE 1 TABLET BY MOUTH 4 TIMES DAILY (Patient taking differently: Take 600 mg by mouth as needed. ) 120 tablet 3  . glucose blood (TRUE METRIX BLOOD GLUCOSE TEST) test strip Use as  instructed 100 each 12  . ipratropium-albuterol (DUONEB) 0.5-2.5 (3) MG/3ML SOLN TAKE 3 MLS VIA NEBULIZATION EVERY 6 HOURS 360 mL 1  . labetalol (NORMODYNE) 100 MG tablet TAKE 1 TABLET BY MOUTH 2 (TWO) TIMES DAILY. 60 tablet 2  . levothyroxine (SYNTHROID) 100 MCG tablet Take 1 tablet (100 mcg total) by mouth daily. 30 tablet 2  . losartan (COZAAR) 50 MG tablet TAKE 1.5 TABLETS (75 MG TOTAL) BY MOUTH DAILY. NEEDS APPOINTMENT WITH PCP. 45 tablet 3  . metFORMIN (GLUCOPHAGE) 500 MG tablet Take 1 tablet (500 mg total) by mouth 2 (two) times daily with a meal. 180 tablet 3  . mirtazapine (REMERON) 30 MG tablet Take 1 tablet (  30 mg total) by mouth at bedtime. 90 tablet 1  . montelukast (SINGULAIR) 10 MG tablet TAKE 1 TABLET BY MOUTH AT BEDTIME. 30 tablet 2  . omeprazole (PRILOSEC) 40 MG capsule TAKE 1 CAPSULE (40 MG TOTAL) BY MOUTH DAILY. 30 capsule 1  . ondansetron (ZOFRAN) 4 MG tablet Take 1 tablet (4 mg total) by mouth every 8 (eight) hours as needed for nausea or vomiting. 20 tablet 0  . ticagrelor (BRILINTA) 90 MG TABS tablet TAKE 1 TABLET BY MOUTH 2 (TWO) TIMES DAILY. (Patient taking differently: Take 90 mg by mouth 2 (two) times daily. ) 60 tablet 6  . triamcinolone cream (KENALOG) 0.1 % APPLY 1 APPLICATION TOPICALLY 2 TIMES DAILY. (Patient taking differently: Apply 1 application topically 2 (two) times daily. ) 30 g 1  . TRUEPLUS LANCETS 28G MISC 28 g by Does not apply route QID. 120 each 2  . umeclidinium bromide (INCRUSE ELLIPTA) 62.5 MCG/INH AEPB Inhale 1 puff into the lungs daily. 1 each 6  . Vitamin E 180 MG CAPS Take 180 mg by mouth daily.    Marland Kitchen azithromycin (ZITHROMAX) 250 MG tablet 2 tabs p.o. x1 then 1 tab daily (Patient not taking: Reported on 08/31/2019) 6 tablet 0  . benzonatate (TESSALON) 100 MG capsule Take 2 capsules (200 mg total) by mouth 3 (three) times daily as needed for cough. (Patient not taking: Reported on 08/31/2019) 40 capsule 1  . doxycycline (VIBRA-TABS) 100 MG tablet Take  1 tablet (100 mg total) by mouth 2 (two) times daily. (Patient not taking: Reported on 08/31/2019) 20 tablet 0  . predniSONE (DELTASONE) 10 MG tablet 4 tabs p.o. daily x2 days then 3 tabs daily for 2 days then 2 tabs daily for 2 days (Patient not taking: Reported on 08/31/2019) 18 tablet 0   No current facility-administered medications for this visit.    Allergies as of 08/31/2019 - Review Complete 08/31/2019  Allergen Reaction Noted  . 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 Latex Shortness Of Breath and Rash 12/22/2008  . Codeine Nausea Only 12/22/2008    Family History  Problem Relation Age of Onset  . Heart disease Mother   . Hypertension Mother   . Stroke Mother   . Mental illness Mother   . Heart disease Father   . Hypertension Father   . Diabetes Father   . Cancer Maternal Aunt        breast  . Thyroid disease Paternal Aunt   . Heart disease Maternal Grandfather   . Anesthesia problems Daughter   . Cancer Paternal Grandmother        mouth  . Hypotension Neg Hx   . Malignant hyperthermia Neg Hx   . Pseudochol deficiency Neg Hx   . Colon cancer Neg Hx     Social History   Socioeconomic History  . Marital status: Divorced    Spouse name: Not on file  . Number of children: Not on file  . Years of education: Not on file  . Highest education level: Not on file  Occupational History  . Not on file  Tobacco Use  . Smoking status: Current Every Day Smoker    Packs/day: 1.00    Years: 50.00    Pack years: 50.00    Types: Cigarettes  . Smokeless tobacco: Never Used  Substance and Sexual Activity  . Alcohol use: Yes    Comment: once a week  . Drug use: No    Types: Cocaine    Comment: has been years  per pt  . Sexual activity: Not Currently    Birth control/protection: Surgical  Other Topics Concern  . Not on file  Social History Narrative  . Not on file   Social Determinants of Health   Financial Resource Strain:   . Difficulty of Paying Living Expenses: Not on  file  Food Insecurity:   . Worried About Charity fundraiser in the Last Year: Not on file  . Ran Out of Food in the Last Year: Not on file  Transportation Needs:   . Lack of Transportation (Medical): Not on file  . Lack of Transportation (Non-Medical): Not on file  Physical Activity:   . Days of Exercise per Week: Not on file  . Minutes of Exercise per Session: Not on file  Stress:   . Feeling of Stress : Not on file  Social Connections:   . Frequency of Communication with Friends and Family: Not on file  . Frequency of Social Gatherings with Friends and Family: Not on file  . Attends Religious Services: Not on file  . Active Member of Clubs or Organizations: Not on file  . Attends Archivist Meetings: Not on file  . Marital Status: Not on file    Review of Systems: Gen: Denies fever, chills, anorexia. Denies fatigue, weakness, weight loss.  CV: Denies chest pain, palpitations, syncope, peripheral edema, and claudication. Resp: Denies dyspnea at rest, cough, wheezing, coughing up blood, and pleurisy. GI: see HPI Derm: Denies rash, itching, dry skin Psych: Denies depression, anxiety, memory loss, confusion. No homicidal or suicidal ideation.  Heme: Denies bruising, bleeding, and enlarged lymph nodes.  Physical Exam: BP (!) 143/82   Pulse 81   Temp 97.8 F (36.6 C) (Temporal)   Ht 5' 1.5" (1.562 m)   Wt 149 lb 12.8 oz (67.9 kg)   BMI 27.85 kg/m  General:   Alert and oriented. No distress noted. Pleasant and cooperative.  Head:  Normocephalic and atraumatic. Eyes:  Conjuctiva clear without scleral icterus. Abdomen:  +BS, soft, mild TTP epigastric and RUQ. No rebound or guarding. No HSM or masses noted. Msk:  Symmetrical without gross deformities. Normal posture. Extremities:  Without edema. Neurologic:  Alert and  oriented x4 Psych:  Alert and cooperative. Normal mood and affect.  ASSESSMENT: LOWANDA CASHAW is a 64 y.o. female presenting today with history of  dysphagia, epigastric/RUQ pain, s/p EGD Nov 2020 with normal esophagus, dilation, erosive gastropathy s/p biopsy, normal duodenum. Negative H.pylori. She has noted no improvement after dilation, and she continues to have chronic postprandial RUQ/epigastric pain. Gallbladder present.  Will pursue RUQ [place] and possibly HIDA scan. Needs to continue PPI daily and may add Pepcid in evening if needed.  Dysphagia likely multifactorial, especially in light of edentulous status. Will order BPE to rule out underlying motility disorder as well.    PLAN:   RUQ [place] ordered, may need HIDA  Continue PPI. May add Pepcid in evening as needed  BPE ordered  6 month follow-up  Annitta Needs, PhD, ANP-BC [facility]

## 2019-08-31 ENCOUNTER — Encounter: Payer: Self-pay | Admitting: Gastroenterology

## 2019-08-31 ENCOUNTER — Other Ambulatory Visit: Payer: Self-pay | Admitting: Physician Assistant

## 2019-08-31 ENCOUNTER — Ambulatory Visit (INDEPENDENT_AMBULATORY_CARE_PROVIDER_SITE_OTHER): Payer: Self-pay | Admitting: Gastroenterology

## 2019-08-31 ENCOUNTER — Other Ambulatory Visit: Payer: Self-pay | Admitting: Internal Medicine

## 2019-08-31 ENCOUNTER — Other Ambulatory Visit: Payer: Self-pay

## 2019-08-31 ENCOUNTER — Ambulatory Visit (INDEPENDENT_AMBULATORY_CARE_PROVIDER_SITE_OTHER): Payer: Medicaid Other | Admitting: Licensed Clinical Social Worker

## 2019-08-31 ENCOUNTER — Encounter (HOSPITAL_COMMUNITY): Payer: Self-pay | Admitting: Licensed Clinical Social Worker

## 2019-08-31 VITALS — BP 143/82 | HR 81 | Temp 97.8°F | Ht 61.5 in | Wt 149.8 lb

## 2019-08-31 DIAGNOSIS — F411 Generalized anxiety disorder: Secondary | ICD-10-CM

## 2019-08-31 DIAGNOSIS — I10 Essential (primary) hypertension: Secondary | ICD-10-CM

## 2019-08-31 DIAGNOSIS — R1310 Dysphagia, unspecified: Secondary | ICD-10-CM

## 2019-08-31 DIAGNOSIS — F32A Depression, unspecified: Secondary | ICD-10-CM

## 2019-08-31 DIAGNOSIS — E039 Hypothyroidism, unspecified: Secondary | ICD-10-CM

## 2019-08-31 DIAGNOSIS — R1013 Epigastric pain: Secondary | ICD-10-CM

## 2019-08-31 DIAGNOSIS — R1319 Other dysphagia: Secondary | ICD-10-CM

## 2019-08-31 DIAGNOSIS — F329 Major depressive disorder, single episode, unspecified: Secondary | ICD-10-CM

## 2019-08-31 DIAGNOSIS — K219 Gastro-esophageal reflux disease without esophagitis: Secondary | ICD-10-CM

## 2019-08-31 MED FILL — ESCITALOPRAM 20 MG TABLET: 20 | 30 days supply | Qty: 30 | Fill #3

## 2019-08-31 MED FILL — FLUTICASONE PROP 50 MCG SPR: 50 | 30 days supply | Qty: 16 | Fill #4

## 2019-08-31 MED FILL — ATORVASTATIN CALCIUM 40 MG: 40 | 30 days supply | Qty: 30 | Fill #3

## 2019-08-31 MED FILL — DOXEPIN 50 MG CAPSULE: 50 | 30 days supply | Qty: 30 | Fill #2

## 2019-08-31 MED FILL — busPIRone HCL 15 MG TABS: 15 | 30 days supply | Qty: 90 | Fill #4

## 2019-08-31 MED FILL — $BREO ELLIPTA 200-25 MCG IN: 200-25 | 90 days supply | Qty: 180 | Fill #4

## 2019-08-31 MED FILL — MONTELUKAST SOD 10 MG TAB: 10 | 30 days supply | Qty: 30 | Fill #1

## 2019-08-31 MED FILL — !VENTOLIN HFA INHALER: 108 (90 BAS | 25 days supply | Qty: 18 | Fill #2

## 2019-08-31 NOTE — Telephone Encounter (Signed)
Called patient and instructed her to go through her PCP at [facility] and discuss the Wellbutrin to help her stop smoking with them per Dr. Casimiro Needle.

## 2019-08-31 NOTE — Progress Notes (Signed)
Virtual Visit via Phone Note  I connected with [name] [name] on 08/31/19 at 11:00 am by phone enabled telemedicine application and verified that I am speaking with the correct person using two identifiers. Pt does not have a computer or internet.   I discussed the limitations of evaluation and management by telemedicine and the availability of in person appointments. The patient expressed understanding and agreed to proceed.  History of Present Illness: Pt was referred to OP therapy by Psychiatrist, Dr. Casimiro Needle, for her anxiety, depression and Bipolar disorder.     Observations/Objective:  Pt presents sad and depressed for her therapy session by phone-enabled telemedicine. Pt discussed her psychiatric symptoms and current life events. Patient was sick upon presentation for her individual therapy session. Her COPD has Exacerbated with bronchitis, and has completed all her medication. Again, reached out to Dr. Karen Chafe nurse inquiring about Wellbutrin prescription to assist with smoking. Reviewed tx paln with patient who verbalized acceptance of the plan.Patient continues to grieve and assisted patient with the process, by educating patient. Again,  Suggested patient reach out to her PCP to inquire if she is eligible for the vaccine now.   Assessment and Plan: Counselor will continue to meet with patient to address treatment plan goals. Patient will continue to follow recommendations of providers and implement skills learned in session.    Follow Up Instructions:  Counselor discussed the assessment and treatment plan with the patient. The patient was provided an opportunity to ask questions and all were answered. The patient agreed with the plan and demonstrated an understanding of the instructions.   The patient was advised to call back or seek an in-person evaluation if the symptoms worsen or if the condition fails to improve as anticipated.  I provided  45 minutes of non-face-to-face time  during this encounter.   Timur Nibert S, LCAS

## 2019-08-31 NOTE — Patient Instructions (Signed)
We have ordered an xray of your esophagus to further evaluate your swallowing and an ultrasound of your abdomen to check out your gallbladder.  Continue Prilosec (omeprazole) 30 minutes before breakfast daily. You can take Pepcid as needed in evenings. This is over the counter. (generic name is famotidine).   We will see you back in 6 months or sooner if needed!  Further recommendations after imaging is reviewed!  It was a pleasure to see you today. I want to create trusting relationships with patients to provide genuine, compassionate, and quality care. I value your feedback. If you receive a survey regarding your visit,  I greatly appreciate you taking time to fill this out.   Annitta Needs, PhD, ANP-BC [facility]

## 2019-09-01 MED FILL — CYCLOBENZAPRINE 10 MG TAB: 10 | 10 days supply | Qty: 30 | Fill #0

## 2019-09-01 MED FILL — OMEPRAZOLE DR 40 MG CAPSULE: 40 | 30 days supply | Qty: 30 | Fill #0

## 2019-09-01 MED FILL — LEVOTHYROXINE SODIUM 100 MC: 100 | 30 days supply | Qty: 30 | Fill #0

## 2019-09-01 MED FILL — FUROSEMIDE 20 MG TABS: 20 | 30 days supply | Qty: 30 | Fill #0

## 2019-09-06 ENCOUNTER — Ambulatory Visit: Payer: Self-pay | Attending: Internal Medicine | Admitting: Internal Medicine

## 2019-09-06 ENCOUNTER — Other Ambulatory Visit: Payer: Self-pay

## 2019-09-06 DIAGNOSIS — F17200 Nicotine dependence, unspecified, uncomplicated: Secondary | ICD-10-CM

## 2019-09-06 DIAGNOSIS — I2510 Atherosclerotic heart disease of native coronary artery without angina pectoris: Secondary | ICD-10-CM

## 2019-09-06 DIAGNOSIS — R7303 Prediabetes: Secondary | ICD-10-CM

## 2019-09-06 DIAGNOSIS — J449 Chronic obstructive pulmonary disease, unspecified: Secondary | ICD-10-CM

## 2019-09-06 DIAGNOSIS — I10 Essential (primary) hypertension: Secondary | ICD-10-CM

## 2019-09-06 MED ORDER — NICOTINE 21 MG/24HR TD PT24: 21.0000 mg | MEDICATED_PATCH | Freq: Every day | TRANSDERMAL | 1 refills | Status: DC

## 2019-09-06 NOTE — Progress Notes (Signed)
Pt states she has a sore throat  Pt states her throat is scratchy

## 2019-09-06 NOTE — Progress Notes (Signed)
Virtual Visit via Telephone Note Due to current restrictions/limitations of in-office visits due to the COVID-19 pandemic, this scheduled clinical appointment was converted to a telehealth visit.  In person visit was preferred by me but patient requested telephone visit.  I connected with [name] [name] on 09/06/19 at 1:55 p.m by telephone and verified that I am speaking with the correct person using two identifiers. I am in my office.  The patient is at home.  Only the patient and myself participated in this encounter.  I discussed the limitations, risks, security and privacy concerns of performing an evaluation and management service by telephone and the availability of in person appointments. I also discussed with the patient that there may be a patient responsible charge related to this service. The patient expressed understanding and agreed to proceed.   History of Present Illness: Patient with history ofpre-DM, GERD, hypothyroidism, HTN, HL, CAD with previous stent, COPD, substance abuse, depression/anxiety and tobacco dependence.   Patient had a telemedicine visit with Dr. Chapman Fitch earlier this month.   COPD: Treated with doxycycline and prednisone for COPD exacerbation earlier this month. Reports she is better since Doxycyclin and Prednisone Little scratchy throat today. No fever.  Lives alone and careful with wearing mask Negative Covid test earlier this month. Wanting COVID vaccine Compliant with Breo and Incruse.  Uses Neb 3 x a day Still smoking about 1/2 pk a day.  Trying to quit.  Feels she needs the nicotine patches again.   CAD/HTN:  Needs battery for her device to check BP No CP, HA, dizziness or LE edema Compliant with Cozaar, ASA and Lipitor.  She limits salt in the foods.  She has been seeing provider with [facility] for  dysphagia and epigastric pain.  She had EGD 05/2019 with normal esophagus, erosive gastropathy, normal duodenum.  She had  negative H. pylori.  She had esophageal dilation.  Plan is for right upper quadrant ultrasound.  Pre-DM: on Metformin.  Eating less Tries to walk around her building twice a day for exercise No blurred vision. Lab Results  Component Value Date   HGBA1C 6.1 (H) 04/05/2019     Outpatient Encounter Medications as of 09/06/2019  Medication Sig  . albuterol (VENTOLIN HFA) 108 (90 Base) MCG/ACT inhaler INHALE 2 PUFFS INTO THE LUNGS EVERY 6 (SIX) HOURS AS NEEDED FOR WHEEZING OR SHORTNESS OF BREATH. (Patient taking differently: Inhale 2 puffs into the lungs every 6 (six) hours as needed for wheezing or shortness of breath. )  . aspirin EC 81 MG tablet Take 81 mg by mouth daily.  Marland Kitchen atorvastatin (LIPITOR) 40 MG tablet Take 1 tablet (40 mg total) by mouth at bedtime.  . Blood Glucose Monitoring Suppl (TRUE METRIX METER) DEVI 1 kit by Does not apply route 4 (four) times daily.  . busPIRone (BUSPAR) 15 MG tablet Take 1 tablet (15 mg total) by mouth 3 (three) times daily.  . cetirizine (ZYRTEC) 10 MG tablet Take 10 mg by mouth daily.  . Cholecalciferol (VITAMIN D) 50 MCG (2000 UT) CAPS Take 2,000 Units by mouth daily.  . cyclobenzaprine (FLEXERIL) 10 MG tablet TAKE 1 TABLET BY MOUTH 3 TIMES DAILY AS NEEDED.  Marland Kitchen Dextromethorphan-guaiFENesin (WAL-TUSSIN DM PO) Take 5 mLs by mouth 3 (three) times daily as needed (cough).  . diphenhydramine-acetaminophen (TYLENOL PM) 25-500 MG TABS tablet Take 1 tablet by mouth at bedtime as needed (sleep).  Marland Kitchen doxepin (SINEQUAN) 50 MG capsule Take 1 capsule (50 mg total) by mouth at bedtime.  Marland Kitchen  escitalopram (LEXAPRO) 20 MG tablet Take 1 tablet (20 mg total) by mouth daily.  . fluticasone (FLONASE) 50 MCG/ACT nasal spray Place 2 sprays into both nostrils daily. (Patient taking differently: Place 2 sprays into both nostrils at bedtime. )  . fluticasone furoate-vilanterol (BREO ELLIPTA) 200-25 MCG/INH AEPB Inhale 1 puff into the lungs daily.  . furosemide (LASIX) 20 MG tablet  TAKE 1 TABLET (20 MG TOTAL) BY MOUTH DAILY.  Marland Kitchen gabapentin (NEURONTIN) 600 MG tablet TAKE 1 TABLET BY MOUTH 4 TIMES DAILY (Patient taking differently: Take 600 mg by mouth as needed. )  . glucose blood (TRUE METRIX BLOOD GLUCOSE TEST) test strip Use as instructed  . ipratropium-albuterol (DUONEB) 0.5-2.5 (3) MG/3ML SOLN TAKE 3 MLS VIA NEBULIZATION EVERY 6 HOURS  . labetalol (NORMODYNE) 100 MG tablet TAKE 1 TABLET BY MOUTH 2 (TWO) TIMES DAILY.  Marland Kitchen levothyroxine (SYNTHROID) 100 MCG tablet TAKE 1 TABLET (100 MCG TOTAL) BY MOUTH DAILY.  Marland Kitchen losartan (COZAAR) 50 MG tablet TAKE 1.5 TABLETS (75 MG TOTAL) BY MOUTH DAILY. NEEDS APPOINTMENT WITH PCP.  . metFORMIN (GLUCOPHAGE) 500 MG tablet Take 1 tablet (500 mg total) by mouth 2 (two) times daily with a meal.  . mirtazapine (REMERON) 30 MG tablet Take 1 tablet (30 mg total) by mouth at bedtime.  . montelukast (SINGULAIR) 10 MG tablet TAKE 1 TABLET BY MOUTH AT BEDTIME.  Marland Kitchen omeprazole (PRILOSEC) 40 MG capsule TAKE 1 CAPSULE (40 MG TOTAL) BY MOUTH DAILY.  Marland Kitchen ondansetron (ZOFRAN) 4 MG tablet Take 1 tablet (4 mg total) by mouth every 8 (eight) hours as needed for nausea or vomiting.  . ticagrelor (BRILINTA) 90 MG TABS tablet TAKE 1 TABLET BY MOUTH 2 (TWO) TIMES DAILY. (Patient taking differently: Take 90 mg by mouth 2 (two) times daily. )  . triamcinolone cream (KENALOG) 0.1 % APPLY 1 APPLICATION TOPICALLY 2 TIMES DAILY. (Patient taking differently: Apply 1 application topically 2 (two) times daily. )  . TRUEPLUS LANCETS 28G MISC 28 g by Does not apply route QID.  Marland Kitchen umeclidinium bromide (INCRUSE ELLIPTA) 62.5 MCG/INH AEPB Inhale 1 puff into the lungs daily.  . Vitamin E 180 MG CAPS Take 180 mg by mouth daily.   No facility-administered encounter medications on file as of 09/06/2019.    Observations/Objective: Results for orders placed or performed in visit on 08/20/19  Novel Coronavirus, NAA ([facility])   Specimen: Nasopharyngeal(NP) swabs in vial transport medium    NASOPHARYNGE  TESTING  Result Value Ref Range   SARS-CoV-2, NAA Not Detected Not Detected     Chemistry      Component Value Date/Time   NA 140 04/05/2019 0926   K 5.0 04/05/2019 0926   CL 102 04/05/2019 0926   CO2 26 04/05/2019 0926   BUN 24 04/05/2019 0926   CREATININE 0.92 04/05/2019 0926   CREATININE 0.93 04/16/2016 0937      Component Value Date/Time   CALCIUM 9.6 04/05/2019 0926   ALKPHOS 107 04/05/2019 0926   AST 17 04/05/2019 0926   ALT 21 04/05/2019 0926   BILITOT 0.3 04/05/2019 0926       Assessment and Plan: 1. COPD with chronic bronchitis ([place]) -Patient to continue Breo and Incruse.  Continue to encourage her to quit smoking.  2. Tobacco dependence Commended her on cutting back.  She is actively trying to quit.  She requests nicotine patches.  We will start her on the 21 mg patch and do the stepdown approach.  Less than 5 minutes spent on counseling. -  nicotine (NICODERM CQ - DOSED IN MG/24 HOURS) 21 mg/24hr patch; Place 1 patch (21 mg total) onto the skin daily.  Dispense: 28 patch; Refill: 1  3. Essential hypertension Continue Cozaar and labetalol.  4. Prediabetes Continue Metformin, healthy eating habits and regular exercise as tolerated  5. Coronary artery disease involving native coronary artery of native heart without angina pectoris Continue Brilinta, atorvastatin, aspirin   Follow Up Instructions: 3 mths in person   I discussed the assessment and treatment plan with the patient. The patient was provided an opportunity to ask questions and all were answered. The patient agreed with the plan and demonstrated an understanding of the instructions.   The patient was advised to call back or seek an in-person evaluation if the symptoms worsen or if the condition fails to improve as anticipated.  I provided 12 minutes of non-face-to-face time during this encounter.   Karle Plumber, MD

## 2019-09-07 MED FILL — NICOTINE 21 MG/24HR PATCH: 21 | 28 days supply | Qty: 28 | Fill #0

## 2019-09-08 ENCOUNTER — Ambulatory Visit (HOSPITAL_COMMUNITY)
Admission: RE | Admit: 2019-09-08 | Discharge: 2019-09-08 | Disposition: A | Discharge status: Home / Self Care | Payer: Self-pay | Source: Ambulatory Visit | Attending: Gastroenterology | Admitting: Gastroenterology

## 2019-09-08 ENCOUNTER — Other Ambulatory Visit: Payer: Self-pay

## 2019-09-08 DIAGNOSIS — R1310 Dysphagia, unspecified: Secondary | ICD-10-CM

## 2019-09-08 DIAGNOSIS — R1319 Other dysphagia: Secondary | ICD-10-CM

## 2019-09-08 DIAGNOSIS — R1013 Epigastric pain: Secondary | ICD-10-CM | POA: Insufficient documentation

## 2019-09-09 NOTE — Progress Notes (Signed)
[place] without gallstones. Recommend HIDA scan if persistent RUQ pain.

## 2019-09-10 ENCOUNTER — Other Ambulatory Visit: Payer: Self-pay

## 2019-09-10 ENCOUNTER — Telehealth: Payer: Self-pay | Admitting: Internal Medicine

## 2019-09-10 DIAGNOSIS — R1011 Right upper quadrant pain: Secondary | ICD-10-CM

## 2019-09-10 NOTE — Telephone Encounter (Signed)
Pt returning call. [phone]

## 2019-09-14 ENCOUNTER — Ambulatory Visit (INDEPENDENT_AMBULATORY_CARE_PROVIDER_SITE_OTHER): Payer: Self-pay | Admitting: Licensed Clinical Social Worker

## 2019-09-14 ENCOUNTER — Other Ambulatory Visit: Payer: Self-pay | Admitting: *Deleted

## 2019-09-14 ENCOUNTER — Other Ambulatory Visit: Payer: Self-pay

## 2019-09-14 ENCOUNTER — Encounter (HOSPITAL_COMMUNITY): Payer: Self-pay | Admitting: Licensed Clinical Social Worker

## 2019-09-14 DIAGNOSIS — F32A Depression, unspecified: Secondary | ICD-10-CM

## 2019-09-14 DIAGNOSIS — F329 Major depressive disorder, single episode, unspecified: Secondary | ICD-10-CM

## 2019-09-14 DIAGNOSIS — R1319 Other dysphagia: Secondary | ICD-10-CM

## 2019-09-14 DIAGNOSIS — R1310 Dysphagia, unspecified: Secondary | ICD-10-CM

## 2019-09-14 DIAGNOSIS — F411 Generalized anxiety disorder: Secondary | ICD-10-CM

## 2019-09-14 NOTE — Progress Notes (Signed)
Virtual Visit via Phone Note  I connected with [name] [name] on 09/14/19 at 11:00 am by phone enabled telemedicine application and verified that I am speaking with the correct person using two identifiers. Pt does not have a computer or internet.   I discussed the limitations of evaluation and management by telemedicine and the availability of in person appointments. The patient expressed understanding and agreed to proceed.  History of Present Illness: Pt was referred to OP therapy by Psychiatrist, Dr. Casimiro Needle, for her anxiety, depression and Bipolar disorder.     Observations/Objective:  Pt presents depressed for her therapy session by phone-enabled telemedicine. Pt discussed her psychiatric symptoms and current life events. Patient continues with being sick upon presentation for her individual therapy session, symptoms of  COPD. Patient described her dr appointments and upcoming tests. Used socratic questions. Patient reports she is not working currently. Asked open ended questions. Clinician utilized MI OARS to reflect and summarize her thoughts and feelings about this new normal life. Clinician discussed the importance of focusing on the here and now, what she can control, rather than what she cannot.       Assessment and Plan: Counselor will continue to meet with patient to address treatment plan goals. Patient will continue to follow recommendations of providers and implement skills learned in session.    Follow Up Instructions:  Counselor discussed the assessment and treatment plan with the patient. The patient was provided an opportunity to ask questions and all were answered. The patient agreed with the plan and demonstrated an understanding of the instructions.   The patient was advised to call back or seek an in-person evaluation if the symptoms worsen or if the condition fails to improve as anticipated.  I provided  45 minutes of non-face-to-face time during this  encounter.   Armenta Erskin S, LCAS

## 2019-09-16 MED FILL — FUROSEMIDE 20 MG TABS: 20 | 30 days supply | Qty: 30 | Fill #0

## 2019-09-16 MED FILL — LEVOTHYROXINE SODIUM 100 MC: 100 | 30 days supply | Qty: 30 | Fill #0

## 2019-09-16 MED FILL — ESCITALOPRAM 20 MG TABLET: 20 | 30 days supply | Qty: 30 | Fill #1

## 2019-09-16 MED FILL — CYCLOBENZAPRINE 10 MG TAB: 10 | 10 days supply | Qty: 30 | Fill #0

## 2019-09-16 MED FILL — GABAPENTIN 600 MG TABLET: 600 | 30 days supply | Qty: 120 | Fill #2

## 2019-09-16 MED FILL — ?METFORMIN HCL 500MG TABLET: 500 | 30 days supply | Qty: 60 | Fill #4

## 2019-09-20 ENCOUNTER — Other Ambulatory Visit: Payer: Self-pay

## 2019-09-20 ENCOUNTER — Ambulatory Visit (HOSPITAL_COMMUNITY)
Admission: RE | Admit: 2019-09-20 | Discharge: 2019-09-20 | Disposition: A | Discharge status: Home / Self Care | Payer: Self-pay | Source: Ambulatory Visit | Attending: Gastroenterology | Admitting: Gastroenterology

## 2019-09-20 DIAGNOSIS — R1011 Right upper quadrant pain: Secondary | ICD-10-CM

## 2019-09-20 MED ORDER — TECHNETIUM TC 99M MEBROFENIN IV KIT
5.0000 | PACK | Freq: Once | INTRAVENOUS | Status: AC | PRN
  Administered 2019-09-20: 5 via INTRAVENOUS

## 2019-09-21 ENCOUNTER — Encounter (HOSPITAL_COMMUNITY): Payer: Self-pay | Admitting: Speech Pathology

## 2019-09-21 ENCOUNTER — Ambulatory Visit (HOSPITAL_COMMUNITY): Payer: Medicaid Other | Attending: Gastroenterology | Admitting: Speech Pathology

## 2019-09-21 ENCOUNTER — Other Ambulatory Visit: Payer: Self-pay

## 2019-09-21 DIAGNOSIS — R1319 Other dysphagia: Secondary | ICD-10-CM

## 2019-09-21 NOTE — Therapy (Signed)
[place], [place], [address] Phone: [phone]   Fax:  [phone]  Speech Language Pathology Evaluation/Clinical Swallow Evaluation  Patient Details  Name: [name] [name] MRN: [id number] Date of Birth: Dec 11, 1955 No data recorded  Encounter Date: 09/21/2019  End of Session - 09/21/19 1933    Visit Number  1    Number of Visits  1    Authorization Type  Medicaid [place]    SLP Start Time  1608    SLP Stop Time   [id number]    SLP Time Calculation (min)  42 min    Activity Tolerance  Patient tolerated treatment well       Past Medical History:  Diagnosis Date  . Anxiety    takes Lexapro daily  . Arthritis    "back, from neck down pass my bra area" (03/25/2017)  . Asthma   . Bartholin gland cyst 08/29/2011  . Bruises easily    pt is on Effient  . Chronic back pain    herniated nucleus pulposus  . Chronic back pain    "neck to bra area; lower back" (03/25/2017)  . COPD (chronic obstructive pulmonary disease) ([place])    early stages  . Coronary artery disease   . Depression    takes Klonopin daily  . Diabetes mellitus without complication ([place])   . Diverticulosis   . GERD (gastroesophageal reflux disease)    takes Nexium daily  . H/O hiatal hernia   . Heart attack ([place]) 2011  . Hemorrhoids   . Hernia   . Hyperlipidemia    takes Lipitor daily  . Hypertension    takes Losartan daily and Labetalol bid  . Hypothyroidism    takes Synthroid daily  . Insomnia    hydroxyzine prn  . Joint pain   . Pneumonia    "couple times" (03/25/2017)  . Pre-diabetes    "just found out 1 wk ago" (03/25/2017)  . Psoriasis    elbows,knees,back  . Shortness of breath    with exertion  . Slowing of urinary stream   . Stress incontinence     Past Surgical History:  Procedure Laterality Date  . ABDOMINAL EXPLORATION SURGERY  1977  . ABDOMINAL HYSTERECTOMY  1977   "left one of my ovaries"  . BACK SURGERY    . BIOPSY   05/27/2019   Procedure: BIOPSY;  Surgeon: Daneil Dolin, MD;  [facility];  Service: Endoscopy;;  . COLONOSCOPY  2011   Dr. Ardis Hughs: Mild diverticulosis, descending diminutive colon polyp (not retrieved), next colonoscopy 10 years  . CORONARY ANGIOPLASTY WITH STENT PLACEMENT  2011 X2   "regular stents didn't work; had to go back in in ~ 1 month and put in medicated stents"  . DILATION AND CURETTAGE OF UTERUS    . ESOPHAGOGASTRODUODENOSCOPY    . ESOPHAGOGASTRODUODENOSCOPY (EGD) WITH PROPOFOL N/A 05/27/2019   normal esophagus, dilation, erosive gastropathy s/p biopsy, normal duodenum. Negative H.pylori.   Marland Kitchen LEFT HEART CATH AND CORONARY ANGIOGRAPHY N/A 03/26/2017   Procedure: LEFT HEART CATH AND CORONARY ANGIOGRAPHY;  Surgeon: Belva Crome, MD;  Location: [facility];  Service: Cardiovascular;  Laterality: N/A;  . LUMBAR LAMINECTOMY/DECOMPRESSION MICRODISCECTOMY  08/05/2011   Procedure: LUMBAR LAMINECTOMY/DECOMPRESSION MICRODISCECTOMY;  Surgeon: Otilio Connors, MD;  Location: [facility];  Service: Neurosurgery;  Laterality: Right;  Right Lumbar four-five extraforaminal discectomy  . MALONEY DILATION N/A 05/27/2019   Procedure: Venia Minks DILATION;  Surgeon: Manus Rudd  M, MD;  Location: [facility];  Service: Endoscopy;  Laterality: N/A;  54  . TONSILLECTOMY     as a child    There were no vitals filed for this visit.  Subjective Assessment - 09/21/19 1928    Subjective  "I get choked a lot."    Currently in Pain?  No/denies        Prior Functional Status - 09/21/19 1930      Prior Functional Status   Cognitive/Linguistic Baseline  Within functional limits    Type of Home  Other(Comment)     Lives With  Alone      General - 09/21/19 1930      General Information   Date of Onset  09/08/19    HPI  [name] [name] is a 64 yo female who was referred for a clinical swallow evaluation by Roseanne Kaufman, NP due to suspected pharyngeal and esophageal dysphagia.    Type  of Study  Bedside Swallow Evaluation    Diet Prior to this Study  Regular;Thin liquids    Temperature Spikes Noted  No    Respiratory Status  Room air    History of Recent Intubation  No    Behavior/Cognition  Alert;Cooperative;Pleasant mood    Oral Cavity Assessment  Within Functional Limits    Oral Care Completed by SLP  No    Oral Cavity - Dentition  Dentures, top;Dentures, bottom    Vision  Functional for self-feeding    Self-Feeding Abilities  Able to feed self    Patient Positioning  Upright in chair    Baseline Vocal Quality  Normal    Volitional Cough  Strong    Volitional Swallow  Able to elicit       Oral Motor/Sensory Function - 09/21/19 1932      Oral Motor/Sensory Function   Overall Oral Motor/Sensory Function  Within functional limits       Thin Liquid - 09/21/19 1932      Thin Liquid   Thin Liquid  Within functional limits    Presentation  Cup;Self Fed        Puree - 09/21/19 1932      Puree   Puree  Within functional limits    Presentation  Self Fed;Spoon      Solid - 09/21/19 1932      Solid   Solid  Impaired    Presentation  Self Fed    Oral Phase Impairments  Impaired mastication    Oral Phase Functional Implications  Oral residue;Impaired mastication    Other Comments  Improved when dentures removed        SLP Education - 09/21/19 1929    Education Details  Provided verbal and written information regarding respiration/swallow reciprocity, aspiration risks, and esophagea swallowing precautions    Person(s) Educated  Patient    Methods  Explanation;Handout    Comprehension  Verbalized understanding        Plan - 09/21/19 1933    Clinical Impression Statement  Pt seen for a clinical swallow evaluation after a referral from Roseanne Kaufman, NP due to Pt reports of "getting choked a lot" with solids, liquids, and saliva at times x 1 year. She has COPD, smokes, has GERD, and is followed by pulmonology. She had an EGD (normal) 05/27/19 and a  barium swallow 09/08/19 which showed penetration of barium without aspiration. Pt has U/L dentures, but does not wear when eating due to ill fitting. Oral motor examination is WNL and Pt  shows no overt signs or symptoms of aspiration during po presentations this date. She had some difficulty masticating regular textures, however this improved once she removed her dentures. Suspect that Pt may have difficulty coordinating swallow with respiration given SOB. She was encouraged to take small sips/bites, avoid talking during meals, swallow 2x for each bite/sip, and implement reflux precautions. These were discussed with Pt and provided in written form. She was given my contact information should she note a decline or wish to pursue MBSS. No further SLP services indicated at this time.    Treatment/Interventions  SLP instruction and feedback;Patient/family education    Consulted and Agree with Plan of Care  Patient       Patient will benefit from skilled therapeutic intervention in order to improve the following deficits and impairments:   Other dysphagia    Problem List Patient Active Problem List   Diagnosis Date Noted  . Abdominal pain, epigastric 04/28/2019  . RUQ pain 04/28/2019  . Esophageal dysphagia 04/28/2019  . Diarrhea 04/28/2019  . Bipolar I disorder ([place]) 09/17/2018  . Insomnia 10/28/2017  . COPD with acute exacerbation ([place]) 09/15/2017  . Pre-diabetes 05/28/2017  . Emesis, persistent 05/28/2017  . QT prolongation 03/25/2017  . Encounter for screening mammogram for breast cancer 09/11/2015  . Psoriasis 06/22/2015  . COPD (chronic obstructive pulmonary disease) ([place]) 05/18/2015  . Anxiety and depression 07/10/2013  . Essential hypertension, benign 06/07/2013  . Dyslipidemia 06/07/2013  . Asthma, chronic 06/07/2013  . Emphysema lung ([place]) 06/07/2013  . Chronic back pain 06/07/2013  . CAD S/P percutaneous coronary angioplasty 06/07/2013  . GERD (gastroesophageal reflux disease)  06/07/2013  . Breast lump on left side at 3 o'clock position 10/13/2012  . S/P abdominal hysterectomy and right salpingo-oophorectomy 08/29/2011  . COPD exacerbation ([place]) 09/15/2009  . TOBACCO ABUSE 07/17/2009  . Chronic rhinitis 07/17/2009  . Lung nodule < 6cm on CT 07/17/2009  . ALLERGY, FOOD 07/17/2009  . Hypothyroidism 12/22/2008   Thank you,  Genene Churn, [place]  [facility] 09/21/2019, 7:35 PM  [facility] [address] [place], [place], [address] Phone: [phone]   Fax:  [phone]  Name: [name] [name] MRN: [id number] Date of Birth: [birth year]

## 2019-09-28 ENCOUNTER — Other Ambulatory Visit: Payer: Self-pay

## 2019-09-28 ENCOUNTER — Telehealth (HOSPITAL_COMMUNITY): Payer: Self-pay | Admitting: Licensed Clinical Social Worker

## 2019-09-28 ENCOUNTER — Ambulatory Visit (HOSPITAL_COMMUNITY): Payer: Medicaid Other | Admitting: Licensed Clinical Social Worker

## 2019-09-28 NOTE — Telephone Encounter (Signed)
Called patient for her individual therapy session. The call went straight to vm, which was full. Unable to leave a message. Called 2x more with same results. [name] [name], LCAS

## 2019-10-11 ENCOUNTER — Other Ambulatory Visit: Payer: Self-pay | Admitting: Physician Assistant

## 2019-10-11 ENCOUNTER — Other Ambulatory Visit (HOSPITAL_COMMUNITY): Payer: Self-pay | Admitting: Psychiatry

## 2019-10-11 ENCOUNTER — Other Ambulatory Visit: Payer: Self-pay | Admitting: Internal Medicine

## 2019-10-11 DIAGNOSIS — J449 Chronic obstructive pulmonary disease, unspecified: Secondary | ICD-10-CM

## 2019-10-11 DIAGNOSIS — G4700 Insomnia, unspecified: Secondary | ICD-10-CM

## 2019-10-11 DIAGNOSIS — E785 Hyperlipidemia, unspecified: Secondary | ICD-10-CM

## 2019-10-11 MED FILL — FUROSEMIDE 20 MG TABS: 20 | 30 days supply | Qty: 30 | Fill #1

## 2019-10-11 MED FILL — MONTELUKAST SOD 10 MG TAB: 10 | 30 days supply | Qty: 30 | Fill #2

## 2019-10-11 MED FILL — busPIRone HCL 15 MG TABS: 15 | 30 days supply | Qty: 90 | Fill #5

## 2019-10-11 MED FILL — LABETALOL HCL 100 MG TABS: 100 | 30 days supply | Qty: 60 | Fill #1

## 2019-10-11 MED FILL — MIRTAZAPINE 30 MG TABLET: 30 | 30 days supply | Qty: 30 | Fill #3

## 2019-10-11 MED FILL — CYCLOBENZAPRINE 10 MG TAB: 10 | 10 days supply | Qty: 30 | Fill #1

## 2019-10-11 MED FILL — LEVOTHYROXINE SODIUM 100 MC: 100 | 30 days supply | Qty: 30 | Fill #1

## 2019-10-11 MED FILL — LOSARTAN POTASSIUM 50 MG TA: 50 | 30 days supply | Qty: 45 | Fill #1

## 2019-10-12 ENCOUNTER — Other Ambulatory Visit: Payer: Self-pay

## 2019-10-12 ENCOUNTER — Encounter (HOSPITAL_COMMUNITY): Payer: Self-pay | Admitting: Licensed Clinical Social Worker

## 2019-10-12 ENCOUNTER — Ambulatory Visit (INDEPENDENT_AMBULATORY_CARE_PROVIDER_SITE_OTHER): Payer: Medicaid Other | Admitting: Licensed Clinical Social Worker

## 2019-10-12 DIAGNOSIS — F411 Generalized anxiety disorder: Secondary | ICD-10-CM

## 2019-10-12 DIAGNOSIS — F329 Major depressive disorder, single episode, unspecified: Secondary | ICD-10-CM

## 2019-10-12 DIAGNOSIS — F32A Depression, unspecified: Secondary | ICD-10-CM

## 2019-10-12 MED FILL — !VENTOLIN HFA INHALER: 108 (90 BAS | 25 days supply | Qty: 18 | Fill #0

## 2019-10-12 MED FILL — ATORVASTATIN CALCIUM 40 MG: 40 | 30 days supply | Qty: 30 | Fill #0

## 2019-10-12 NOTE — Progress Notes (Signed)
Virtual Visit via Phone Note  I connected with [name] [name] on 10/12/19 at 11:00 am by phone enabled telemedicine application and verified that I am speaking with the correct person using two identifiers. Pt does not have a computer or internet.   I discussed the limitations of evaluation and management by telemedicine and the availability of in person appointments. The patient expressed understanding and agreed to proceed.  History of Present Illness: Pt was referred to OP therapy by Psychiatrist, Dr. Casimiro Needle, for her anxiety, depression and Bipolar disorder.     Observations/Objective:  Pt presents depressed for her therapy session by phone-enabled telemedicine. Pt discussed her psychiatric symptoms and current life events. Patient sounded more healthy than she has in recent months. Patient described her improved self care. Educated patient on the importance of self care.   Assessment and Plan: Counselor will continue to meet with patient to address treatment plan goals. Patient will continue to follow recommendations of providers and implement skills learned in session.    Follow Up Instructions:  Counselor discussed the assessment and treatment plan with the patient. The patient was provided an opportunity to ask questions and all were answered. The patient agreed with the plan and demonstrated an understanding of the instructions.   The patient was advised to call back or seek an in-person evaluation if the symptoms worsen or if the condition fails to improve as anticipated.  I provided  45 minutes of non-face-to-face time during this encounter.   [name] [name] S, LCAS

## 2019-10-18 MED FILL — DOXEPIN 50 MG CAPSULE: 50 | 30 days supply | Qty: 30 | Fill #0

## 2019-10-20 MED FILL — !VENTOLIN HFA INHALER: 108 (90 BAS | 25 days supply | Qty: 18 | Fill #1

## 2019-10-20 MED FILL — OMEPRAZOLE DR 40 MG CAPSULE: 40 | 30 days supply | Qty: 30 | Fill #0

## 2019-10-26 ENCOUNTER — Encounter (HOSPITAL_COMMUNITY): Payer: Self-pay | Admitting: Licensed Clinical Social Worker

## 2019-10-26 ENCOUNTER — Other Ambulatory Visit: Payer: Self-pay

## 2019-10-26 ENCOUNTER — Ambulatory Visit (INDEPENDENT_AMBULATORY_CARE_PROVIDER_SITE_OTHER): Payer: Medicaid Other | Admitting: Licensed Clinical Social Worker

## 2019-10-26 DIAGNOSIS — F411 Generalized anxiety disorder: Secondary | ICD-10-CM

## 2019-10-26 DIAGNOSIS — F329 Major depressive disorder, single episode, unspecified: Secondary | ICD-10-CM

## 2019-10-26 DIAGNOSIS — F32A Depression, unspecified: Secondary | ICD-10-CM

## 2019-10-26 NOTE — Progress Notes (Signed)
Virtual Visit via Phone Note  I connected with [name] [name] on 10/26/19 at 5:00 pm EDT by phone enabled telemedicine application and verified that I am speaking with the correct person using two identifiers. Pt does not have a computer or internet.   I discussed the limitations of evaluation and management by telemedicine and the availability of in person appointments. The patient expressed understanding and agreed to proceed.  History of Present Illness: Pt was referred to OP therapy by Psychiatrist, Dr. Casimiro Needle, for her anxiety, depression and Bipolar disorder.     Observations/Objective:  Pt presents WNL for her therapy session by phone-enabled telemedicine. Pt discussed her psychiatric symptoms and current life events. Patient presented healthy and had both the covid vaccines. 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 Patient reports her moods have been stable especially since she's working again. Clinician utilized MI OARS to reflect and summarize her thoughts and feelings.  Assessment and Plan: Counselor will continue to meet with patient to address treatment plan goals. Patient will continue to follow recommendations of providers and implement skills learned in session.    Follow Up Instructions:  Counselor discussed the assessment and treatment plan with the patient. The patient was provided an opportunity to ask questions and all were answered. The patient agreed with the plan and demonstrated an understanding of the instructions.   The patient was advised to call back or seek an in-person evaluation if the symptoms worsen or if the condition fails to improve as anticipated.  I provided  25 minutes of non-face-to-face time during this encounter.   Rossie Scarfone S, LCAS

## 2019-11-03 ENCOUNTER — Telehealth: Payer: Self-pay | Admitting: Medical

## 2019-11-03 NOTE — Telephone Encounter (Signed)
New message   Patient states that her daughter will be coming with her to appt because of memory issues.

## 2019-11-03 NOTE — Telephone Encounter (Signed)
Left message for patient explaining visitation rules. Since she has memory issues, she will be allowed a visitor. Instructed her to call back with questions.

## 2019-11-07 NOTE — Progress Notes (Signed)
Cardiology Office Note   Date:  11/08/2019   ID:  [name] [name], DOB 10-24-1955, MRN [id number]  PCP:  Ladell Pier, MD  Cardiologist:  Shelva Majestic, MD EP: None  Chief Complaint  Patient presents with  . Follow-up    CAD      History of Present Illness: [name] [name] is a 64 y.o. female with a PMH of CAD s/p PCI/DES to RCA in 2001 with staged intervention to LAD with BMS and PCI to ostial diagonal with subsequent PCI/DES to RCA for ISR, HTN, HLD, DM type 2, hypothyroidism, psoriasis, COPD, tobacco abuse, and substance abuse, who presents for routine follow-up.   She was last evaluated by cardiology at an outpatient visit with Almyra Deforest, PA-C 03/2017, at which time she was without chest pain complaints. No medication changes occurred and she was recommended for a FLP and follow-up with Dr. Claiborne Billings in 5 months. It appears she was lost to follow-up at that time. Her last ischemic evaluation was a cardiac catheterization on 03/26/2017 by Dr. Tamala Julian which showed chronic total occlusion of RCA due to the in-stent restenosis with left-to-right collaterals, diffuse 50% narrowing in proximal LAD, diffuse 40-50% in-stent restenosis in the first diagonal, 70% mid OM 2, 50% proximal OM 3 stenosis, EF 65%. Aggressive medical therapy was recommended. Echocardiogram obtained on the same day showed EF 55-60%, grade 1 DD, no RWMA. She was unable to get outpatient assistance for Effient, she appears to be a hypo-responder to Plavix as she had in-stent restenosis in the past with Plavix, therefore she was switched to [place].  She presents today for follow-up with her daughter. Daughter reports she has had some memory trouble recently. Patient reports chronic DOE which she attributes to her COPD and longstanding tobacco use. She is still smoking 1 ppd. She also reports an episode of exertional chest tightness which occurs ~1 time per month. These episodes are associated with SOB and typically last for a few  minutes before resolving with rest. She does report some similarities to symptoms felt prior to her heart catheterizations thought not a severe or occurring with as much frequency. She has not trialed any SL nitro as she no longer has an active prescription. No complaints of dizziness, lightheadedness, syncope, LE edema, orthopnea, or PND. She reports abstinence from cocaine. Per daughter, she drinks ~1 box of wine per week. 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     Past Medical History:  Diagnosis Date  . Anxiety    takes Lexapro daily  . Arthritis    "back, from neck down pass my bra area" (03/25/2017)  . Asthma   . Bartholin gland cyst 08/29/2011  . Bruises easily    pt is on Effient  . Chronic back pain    herniated nucleus pulposus  . Chronic back pain    "neck to bra area; lower back" (03/25/2017)  . COPD (chronic obstructive pulmonary disease) ([place])    early stages  . Coronary artery disease   . Depression    takes Klonopin daily  . Diabetes mellitus without complication ([place])   . Diverticulosis   . GERD (gastroesophageal reflux disease)    takes Nexium daily  . H/O hiatal hernia   . Heart attack ([place]) 2011  . Hemorrhoids   . Hernia   . Hyperlipidemia    takes Lipitor daily  . Hypertension    takes Losartan daily and Labetalol bid  . Hypothyroidism  takes Synthroid daily  . Insomnia    hydroxyzine prn  . Joint pain   . Pneumonia    "couple times" (03/25/2017)  . Pre-diabetes    "just found out 1 wk ago" (03/25/2017)  . Psoriasis    elbows,knees,back  . Shortness of breath    with exertion  . Slowing of urinary stream   . Stress incontinence     Past Surgical History:  Procedure Laterality Date  . ABDOMINAL EXPLORATION SURGERY  1977  . ABDOMINAL HYSTERECTOMY  1977   "left one of my ovaries"  . BACK SURGERY    . BIOPSY  05/27/2019   Procedure: BIOPSY;  Surgeon: Daneil Dolin, MD;  Location: [facility];  Service:  Endoscopy;;  . COLONOSCOPY  2011   Dr. Ardis Hughs: Mild diverticulosis, descending diminutive colon polyp (not retrieved), next colonoscopy 10 years  . CORONARY ANGIOPLASTY WITH STENT PLACEMENT  2011 X2   "regular stents didn't work; had to go back in in ~ 1 month and put in medicated stents"  . DILATION AND CURETTAGE OF UTERUS    . ESOPHAGOGASTRODUODENOSCOPY    . ESOPHAGOGASTRODUODENOSCOPY (EGD) WITH PROPOFOL N/A 05/27/2019   normal esophagus, dilation, erosive gastropathy s/p biopsy, normal duodenum. Negative H.pylori.   Marland Kitchen LEFT HEART CATH AND CORONARY ANGIOGRAPHY N/A 03/26/2017   Procedure: LEFT HEART CATH AND CORONARY ANGIOGRAPHY;  Surgeon: Belva Crome, MD;  Location: [facility];  Service: Cardiovascular;  Laterality: N/A;  . LUMBAR LAMINECTOMY/DECOMPRESSION MICRODISCECTOMY  08/05/2011   Procedure: LUMBAR LAMINECTOMY/DECOMPRESSION MICRODISCECTOMY;  Surgeon: Otilio Connors, MD;  Location: [facility];  Service: Neurosurgery;  Laterality: Right;  Right Lumbar four-five extraforaminal discectomy  . MALONEY DILATION N/A 05/27/2019   Procedure: Venia Minks DILATION;  Surgeon: Daneil Dolin, MD;  Location: [facility];  Service: Endoscopy;  Laterality: N/A;  54  . TONSILLECTOMY     as a child     Current Outpatient Medications  Medication Sig Dispense Refill  . albuterol (VENTOLIN HFA) 108 (90 Base) MCG/ACT inhaler INHALE 2 PUFFS INTO THE LUNGS EVERY 6 (SIX) HOURS AS NEEDED FOR WHEEZING OR SHORTNESS OF BREATH. 18 g 2  . aspirin EC 81 MG tablet Take 81 mg by mouth daily.    . Blood Glucose Monitoring Suppl (TRUE METRIX METER) DEVI 1 kit by Does not apply route 4 (four) times daily. 1 Device 0  . BREO ELLIPTA 200-25 MCG/INH AEPB INHALE 1 PUFF INTO THE LUNGS DAILY. 60 each 2  . busPIRone (BUSPAR) 15 MG tablet Take 1 tablet (15 mg total) by mouth 3 (three) times daily. 270 tablet 1  . cetirizine (ZYRTEC) 10 MG tablet Take 10 mg by mouth daily.    . Cholecalciferol (VITAMIN D) 50 MCG (2000  UT) CAPS Take 2,000 Units by mouth daily.    . cyclobenzaprine (FLEXERIL) 10 MG tablet TAKE 1 TABLET BY MOUTH 3 TIMES DAILY AS NEEDED. 30 tablet 1  . diphenhydramine-acetaminophen (TYLENOL PM) 25-500 MG TABS tablet Take 1 tablet by mouth at bedtime as needed (sleep).    Marland Kitchen doxepin (SINEQUAN) 50 MG capsule TAKE 1 CAPSULE (50 MG TOTAL) BY MOUTH AT BEDTIME. 30 capsule 2  . escitalopram (LEXAPRO) 20 MG tablet Take 1 tablet (20 mg total) by mouth daily. 30 tablet 2  . fluticasone (FLONASE) 50 MCG/ACT nasal spray Place 2 sprays into both nostrils daily. (Patient taking differently: Place 2 sprays into both nostrils at bedtime. ) 16 g 6  . furosemide (LASIX) 20 MG tablet TAKE  1 TABLET (20 MG TOTAL) BY MOUTH DAILY. 30 tablet 2  . gabapentin (NEURONTIN) 600 MG tablet TAKE 1 TABLET BY MOUTH 4 TIMES DAILY (Patient taking differently: Take 600 mg by mouth as needed. ) 120 tablet 3  . glucose blood (TRUE METRIX BLOOD GLUCOSE TEST) test strip Use as instructed 100 each 12  . ipratropium-albuterol (DUONEB) 0.5-2.5 (3) MG/3ML SOLN TAKE 3 MLS VIA NEBULIZATION EVERY 6 HOURS 360 mL 1  . labetalol (NORMODYNE) 100 MG tablet TAKE 1 TABLET BY MOUTH 2 (TWO) TIMES DAILY. 60 tablet 2  . levothyroxine (SYNTHROID) 100 MCG tablet TAKE 1 TABLET (100 MCG TOTAL) BY MOUTH DAILY. 30 tablet 2  . losartan (COZAAR) 50 MG tablet TAKE 1.5 TABLETS (75 MG TOTAL) BY MOUTH DAILY. NEEDS APPOINTMENT WITH PCP. 45 tablet 3  . metFORMIN (GLUCOPHAGE) 500 MG tablet Take 1 tablet (500 mg total) by mouth 2 (two) times daily with a meal. 180 tablet 3  . mirtazapine (REMERON) 30 MG tablet Take 1 tablet (30 mg total) by mouth at bedtime. 90 tablet 1  . montelukast (SINGULAIR) 10 MG tablet TAKE 1 TABLET BY MOUTH AT BEDTIME. 30 tablet 2  . omeprazole (PRILOSEC) 40 MG capsule TAKE 1 CAPSULE (40 MG TOTAL) BY MOUTH DAILY. 30 capsule 1  . ondansetron (ZOFRAN) 4 MG tablet Take 1 tablet (4 mg total) by mouth every 8 (eight) hours as needed for nausea or  vomiting. 20 tablet 0  . ticagrelor (BRILINTA) 90 MG TABS tablet TAKE 1 TABLET BY MOUTH 2 (TWO) TIMES DAILY. (Patient taking differently: Take 90 mg by mouth 2 (two) times daily. ) 60 tablet 6  . TRUEPLUS LANCETS 28G MISC 28 g by Does not apply route QID. 120 each 2  . umeclidinium bromide (INCRUSE ELLIPTA) 62.5 MCG/INH AEPB Inhale 1 puff into the lungs daily. 1 each 6  . Vitamin E 180 MG CAPS Take 180 mg by mouth daily.    Marland Kitchen amLODipine (NORVASC) 5 MG tablet Take 1 tablet (5 mg total) by mouth daily. 90 tablet 3  . atorvastatin (LIPITOR) 80 MG tablet Take 1 tablet (80 mg total) by mouth daily. 90 tablet 3  . Dextromethorphan-guaiFENesin (WAL-TUSSIN DM PO) Take 5 mLs by mouth 3 (three) times daily as needed (cough).    . nicotine (NICODERM CQ - DOSED IN MG/24 HOURS) 21 mg/24hr patch Place 1 patch (21 mg total) onto the skin daily. (Patient not taking: Reported on 11/08/2019) 28 patch 1  . nitroGLYCERIN (NITROSTAT) 0.4 MG SL tablet Place 1 tablet (0.4 mg total) under the tongue every 5 (five) minutes as needed for chest pain. 90 tablet 3  . triamcinolone cream (KENALOG) 0.1 % APPLY 1 APPLICATION TOPICALLY 2 TIMES DAILY. (Patient not taking: Reported on 11/08/2019) 30 g 1   No current facility-administered medications for this visit.    Allergies:   Avocado, Latex, and Codeine    Social History:  The patient  reports that she has been smoking cigarettes. She has a 50.00 pack-year smoking history. She has never used smokeless tobacco. She reports current alcohol use. She reports that she does not use drugs.   Family History:  The patient's family history includes Anesthesia problems in her daughter; Cancer in her maternal aunt and paternal grandmother; Diabetes in her father; Heart disease in her father, maternal grandfather, and mother; Hypertension in her father and mother; Mental illness in her mother; Stroke in her mother; Thyroid disease in her paternal aunt.    ROS:  Please see the history  of  present illness.   Otherwise, review of systems are positive for none.   All other systems are reviewed and negative.    PHYSICAL EXAM: VS:  BP (!) 148/78 (BP Location: Left Arm, Patient Position: Sitting, Cuff Size: Normal)   Pulse 85   Temp 97.9 F (36.6 C) Comment: Forehead  Ht '5\' 1"'  (1.549 m)   Wt 147 lb (66.7 kg)   SpO2 96%   BMI 27.78 kg/m  , BMI Body mass index is 27.78 kg/m. GEN: Well nourished, well developed, in no acute distress HEENT: sclera anicteric Neck: no JVD, carotid bruits, or masses Cardiac: RRR; no murmurs, rubs, or gallops, no edema  Respiratory:  clear to auscultation bilaterally, normal work of breathing GI: soft, nontender, nondistended, + BS MS: no deformity or atrophy Skin: warm and dry, no rash Neuro:  Strength and sensation are intact Psych: euthymic mood, full affect   EKG:  EKG is ordered today. The ekg ordered today demonstrates sinus rhythm, rate 85 bpm, no STE/D, no TWI, no significant change from previous.    Recent Labs: 04/05/2019: ALT 21; BUN 24; Creatinine, Ser 0.92; Hemoglobin 12.6; Platelets 501; Potassium 5.0; Sodium 140; TSH 0.352    Lipid Panel    Component Value Date/Time   CHOL 185 04/05/2019 0926   TRIG 104 04/05/2019 0926   HDL 67 04/05/2019 0926   CHOLHDL 2.8 04/05/2019 0926   CHOLHDL 3.8 04/16/2016 0934   VLDL 15 04/16/2016 0934   LDLCALC 99 04/05/2019 0926      Wt Readings from Last 3 Encounters:  11/08/19 147 lb (66.7 kg)  08/31/19 149 lb 12.8 oz (67.9 kg)  05/27/19 150 lb (68 kg)      Other studies Reviewed: Additional studies/ records that were reviewed today include:  Echocardiogram 2018: - Left ventricle: The cavity size was normal. Systolic function was  normal. The estimated ejection fraction was in the range of 55%  to 60%. Wall motion was normal; there were no regional wall  motion abnormalities. Doppler parameters are consistent with  abnormal left ventricular relaxation (grade 1  diastolic  dysfunction).   Left heart catheterization 2018:  Chronic total occlusion of the right coronary due to in-stent restenosis. Left-to-right collaterals are noted.  Diffuse 50% narrowing of the proximal LAD stent. Diffuse 40-50% in-stent restenosis of the first diagonal.  70% mid second obtuse marginal stenosis.  50% proximal third obtuse marginal stenosis.  Widely patent left main coronary artery.  Normal left ventricular systolic function, EF 50%, with EDP 5 mmHg.  RECOMMENDATIONS:   Aggressive risk factor modification  Care management to help with acquiring medication.    ASSESSMENT AND PLAN:  1. CAD with extensive PCI history: she has ~1 episode/month of exertional chest pain which lasts for a couple minutes before resolving with rest. Similar to prior angina though not as severe.  - Will plan for a NST to further evaluate for ischemia - Continue aspirin and brilinta  - Continue atorvastatin - Continue BBlocker  2. HTN: BP 148/78 today - Will start amlodipine 2m daily which may provide some antianginal effects as well - Continue lasix, labetalol, and losartan  3. HLD: LDL 99 03/2019; goal <70 - Will increase atorvastatin to 861mdaily   4. DM type 2: A1C 6.1 03/2019 - Continue metformin  5. Tobacco abuse: still smoking 1ppd. Doesn't think she can quit - Continue to encourage cessation   Current medicines are reviewed at length with the patient today.  The patient does not  have concerns regarding medicines.  The following changes have been made:  As above  Labs/ tests ordered today include:   Orders Placed This Encounter  Procedures  . MYOCARDIAL PERFUSION IMAGING  . EKG 12-Lead     Disposition:   FU with Dr. Claiborne Billings in 3-4 months  Signed, Abigail Butts, PA-C  11/08/2019 5:07 PM

## 2019-11-08 ENCOUNTER — Encounter: Payer: Self-pay | Admitting: Cardiovascular Disease

## 2019-11-08 ENCOUNTER — Encounter: Payer: Self-pay | Admitting: Medical

## 2019-11-08 ENCOUNTER — Other Ambulatory Visit: Payer: Self-pay | Admitting: Medical

## 2019-11-08 ENCOUNTER — Ambulatory Visit (INDEPENDENT_AMBULATORY_CARE_PROVIDER_SITE_OTHER): Payer: Self-pay | Admitting: Medical

## 2019-11-08 ENCOUNTER — Other Ambulatory Visit: Payer: Self-pay

## 2019-11-08 VITALS — BP 148/78 | HR 85 | Temp 97.9°F | Ht 61.0 in | Wt 147.0 lb

## 2019-11-08 DIAGNOSIS — E782 Mixed hyperlipidemia: Secondary | ICD-10-CM

## 2019-11-08 DIAGNOSIS — R079 Chest pain, unspecified: Secondary | ICD-10-CM

## 2019-11-08 DIAGNOSIS — I25119 Atherosclerotic heart disease of native coronary artery with unspecified angina pectoris: Secondary | ICD-10-CM

## 2019-11-08 DIAGNOSIS — E119 Type 2 diabetes mellitus without complications: Secondary | ICD-10-CM

## 2019-11-08 DIAGNOSIS — Z720 Tobacco use: Secondary | ICD-10-CM

## 2019-11-08 DIAGNOSIS — I10 Essential (primary) hypertension: Secondary | ICD-10-CM

## 2019-11-08 MED ORDER — NITROGLYCERIN 0.4 MG SL SUBL: 0.4000 mg | SUBLINGUAL_TABLET | SUBLINGUAL | 3 refills | Status: DC | PRN

## 2019-11-08 MED ORDER — ATORVASTATIN CALCIUM 80 MG PO TABS: 80.0000 mg | ORAL_TABLET | Freq: Every day | ORAL | 3 refills | Status: DC

## 2019-11-08 MED ORDER — AMLODIPINE BESYLATE 5 MG PO TABS: 5.0000 mg | ORAL_TABLET | Freq: Every day | ORAL | 3 refills | Status: DC

## 2019-11-08 NOTE — Patient Instructions (Addendum)
Medication Instructions:   Increase Atorvastatin to 80 mg daily  Start Amlodipine 5 mg daily  Take Nitroglycerin as needed for chest pain   *If you need a refill on your cardiac medications before your next appointment, please call your pharmacy*  Lab Work: NONE ordered at this time of appointment   If you have labs (blood work) drawn today and your tests are completely normal, you will receive your results only by: Marland Kitchen MyChart Message (if you have MyChart) OR . A paper copy in the mail If you have any lab test that is abnormal or we need to change your treatment, we will call you to review the results.  Testing/Procedures: Your physician has requested that you have a lexiscan myoview. For further information please visit [email or web address]. Please follow instruction sheet, as given.   Please schedule for 2 weeks   Follow-Up: At [facility] you and your health needs are our priority.  As part of our continuing mission to provide you with exceptional heart care, we have created designated Provider Care Teams.  These Care Teams include your primary Cardiologist (physician) and Advanced Practice Providers (APPs -  Physician Assistants and Nurse Practitioners) who all work together to provide you with the care you need, when you need it.  Your next appointment:   3-4 month(s)  The format for your next appointment:   In Person  Provider:   Shelva Majestic, MD  Other Instructions   Monitor blood pressure daily. IF your blood pressure is consistently 130/80 give our office a call.

## 2019-11-09 ENCOUNTER — Ambulatory Visit (HOSPITAL_COMMUNITY): Payer: Medicaid Other | Admitting: Licensed Clinical Social Worker

## 2019-11-09 ENCOUNTER — Telehealth (HOSPITAL_COMMUNITY): Payer: Self-pay | Admitting: Licensed Clinical Social Worker

## 2019-11-09 MED FILL — NITROGLYCERIN 0.4 MG TAB SL: 0.4 | 25 days supply | Qty: 25 | Fill #0

## 2019-11-09 MED FILL — AMLODIPINE BESYLATE 5 MG TA: 5 | 30 days supply | Qty: 30 | Fill #0

## 2019-11-09 MED FILL — ATORVASTATIN 80 MG TABLET: 80 | 30 days supply | Qty: 30 | Fill #0

## 2019-11-09 NOTE — Telephone Encounter (Signed)
Called patient for her scheduled phone virutal session. She reports she is not feeling well and would like to reschedule appointment.  [name] Fisher, MS, LCAS

## 2019-11-11 ENCOUNTER — Ambulatory Visit (INDEPENDENT_AMBULATORY_CARE_PROVIDER_SITE_OTHER): Payer: Medicaid Other | Admitting: Licensed Clinical Social Worker

## 2019-11-11 ENCOUNTER — Encounter (HOSPITAL_COMMUNITY): Payer: Self-pay | Admitting: Licensed Clinical Social Worker

## 2019-11-11 ENCOUNTER — Other Ambulatory Visit: Payer: Self-pay

## 2019-11-11 DIAGNOSIS — F411 Generalized anxiety disorder: Secondary | ICD-10-CM

## 2019-11-11 DIAGNOSIS — F32A Depression, unspecified: Secondary | ICD-10-CM

## 2019-11-11 DIAGNOSIS — F329 Major depressive disorder, single episode, unspecified: Secondary | ICD-10-CM

## 2019-11-11 DIAGNOSIS — F419 Anxiety disorder, unspecified: Secondary | ICD-10-CM

## 2019-11-11 DIAGNOSIS — F319 Bipolar disorder, unspecified: Secondary | ICD-10-CM

## 2019-11-11 NOTE — Progress Notes (Signed)
Virtual Visit via Phone Note  I connected with [name] [name] on 11/11/19 at 12:00 pm EDT by phone enabled telemedicine application and verified that I am speaking with the correct person using two identifiers. Pt does not have a computer or internet.   I discussed the limitations of evaluation and management by telemedicine and the availability of in person appointments. The patient expressed understanding and agreed to proceed.  History of Present Illness: Pt was referred to OP therapy by Psychiatrist, Dr. Casimiro Needle, for her anxiety, depression and Bipolar disorder.     Observations/Objective:  Pt presents WNL for her therapy session by phone-enabled telemedicine. Pt discussed her psychiatric symptoms and current life events. 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 Used Socratic questions. Patient's dog was attacked by another dog and she ended up having to put her dog down. "I am devastated, he was baby!" Validated her feelings and provided psychoeducation on grief.     Assessment and Plan: Counselor will continue to meet with patient to address treatment plan goals. Patient will continue to follow recommendations of providers and implement skills learned in session.    Follow Up Instructions:  Counselor discussed the assessment and treatment plan with the patient. The patient was provided an opportunity to ask questions and all were answered. The patient agreed with the plan and demonstrated an understanding of the instructions.   The patient was advised to call back or seek an in-person evaluation if the symptoms worsen or if the condition fails to improve as anticipated.  I provided  30 minutes of non-face-to-face time during this  encounter.   Kennidy Lamke S, LCAS

## 2019-11-16 ENCOUNTER — Other Ambulatory Visit: Payer: Self-pay | Admitting: Internal Medicine

## 2019-11-16 MED FILL — LABETALOL HCL 100 MG TABS: 100 | 30 days supply | Qty: 60 | Fill #2

## 2019-11-16 MED FILL — MIRTAZAPINE 30 MG TABLET: 30 | 30 days supply | Qty: 30 | Fill #4

## 2019-11-16 MED FILL — $VENTOLIN HFA 18G INHALER: 108 (90 BAS | 25 days supply | Qty: 18 | Fill #2

## 2019-11-16 MED FILL — OMEPRAZOLE DR 40 MG CAPSULE: 40 | 30 days supply | Qty: 30 | Fill #1

## 2019-11-16 MED FILL — LOSARTAN POTASSIUM 50 MG TA: 50 | 30 days supply | Qty: 45 | Fill #2

## 2019-11-16 MED FILL — busPIRone HCL 15 MG TABS: 15 | 30 days supply | Qty: 90 | Fill #0

## 2019-11-16 MED FILL — ESCITALOPRAM 20 MG TABLET: 20 | 30 days supply | Qty: 30 | Fill #2

## 2019-11-18 ENCOUNTER — Ambulatory Visit: Payer: Self-pay | Attending: Critical Care Medicine | Admitting: Critical Care Medicine

## 2019-11-18 ENCOUNTER — Other Ambulatory Visit: Payer: Self-pay | Admitting: Critical Care Medicine

## 2019-11-18 ENCOUNTER — Other Ambulatory Visit: Payer: Self-pay

## 2019-11-18 ENCOUNTER — Encounter: Payer: Self-pay | Admitting: Critical Care Medicine

## 2019-11-18 VITALS — BP 149/77 | HR 69 | Temp 98.0°F | Resp 19 | Ht 61.0 in | Wt 142.0 lb

## 2019-11-18 DIAGNOSIS — Z79899 Other long term (current) drug therapy: Secondary | ICD-10-CM | POA: Insufficient documentation

## 2019-11-18 DIAGNOSIS — I2510 Atherosclerotic heart disease of native coronary artery without angina pectoris: Secondary | ICD-10-CM | POA: Insufficient documentation

## 2019-11-18 DIAGNOSIS — K219 Gastro-esophageal reflux disease without esophagitis: Secondary | ICD-10-CM

## 2019-11-18 DIAGNOSIS — F419 Anxiety disorder, unspecified: Secondary | ICD-10-CM | POA: Insufficient documentation

## 2019-11-18 DIAGNOSIS — J449 Chronic obstructive pulmonary disease, unspecified: Secondary | ICD-10-CM

## 2019-11-18 DIAGNOSIS — F32A Depression, unspecified: Secondary | ICD-10-CM

## 2019-11-18 DIAGNOSIS — Z7982 Long term (current) use of aspirin: Secondary | ICD-10-CM | POA: Insufficient documentation

## 2019-11-18 DIAGNOSIS — I10 Essential (primary) hypertension: Secondary | ICD-10-CM | POA: Insufficient documentation

## 2019-11-18 DIAGNOSIS — Z833 Family history of diabetes mellitus: Secondary | ICD-10-CM | POA: Insufficient documentation

## 2019-11-18 DIAGNOSIS — F329 Major depressive disorder, single episode, unspecified: Secondary | ICD-10-CM | POA: Insufficient documentation

## 2019-11-18 DIAGNOSIS — Z885 Allergy status to narcotic agent status: Secondary | ICD-10-CM | POA: Insufficient documentation

## 2019-11-18 DIAGNOSIS — Z8349 Family history of other endocrine, nutritional and metabolic diseases: Secondary | ICD-10-CM | POA: Insufficient documentation

## 2019-11-18 DIAGNOSIS — E785 Hyperlipidemia, unspecified: Secondary | ICD-10-CM | POA: Insufficient documentation

## 2019-11-18 DIAGNOSIS — Z79890 Hormone replacement therapy (postmenopausal): Secondary | ICD-10-CM | POA: Insufficient documentation

## 2019-11-18 DIAGNOSIS — J432 Centrilobular emphysema: Secondary | ICD-10-CM

## 2019-11-18 DIAGNOSIS — I252 Old myocardial infarction: Secondary | ICD-10-CM | POA: Insufficient documentation

## 2019-11-18 DIAGNOSIS — F17200 Nicotine dependence, unspecified, uncomplicated: Secondary | ICD-10-CM

## 2019-11-18 DIAGNOSIS — Z8249 Family history of ischemic heart disease and other diseases of the circulatory system: Secondary | ICD-10-CM | POA: Insufficient documentation

## 2019-11-18 DIAGNOSIS — J441 Chronic obstructive pulmonary disease with (acute) exacerbation: Secondary | ICD-10-CM

## 2019-11-18 DIAGNOSIS — E039 Hypothyroidism, unspecified: Secondary | ICD-10-CM | POA: Insufficient documentation

## 2019-11-18 DIAGNOSIS — J4489 Other specified chronic obstructive pulmonary disease: Secondary | ICD-10-CM

## 2019-11-18 DIAGNOSIS — E1165 Type 2 diabetes mellitus with hyperglycemia: Secondary | ICD-10-CM

## 2019-11-18 DIAGNOSIS — Z7984 Long term (current) use of oral hypoglycemic drugs: Secondary | ICD-10-CM | POA: Insufficient documentation

## 2019-11-18 DIAGNOSIS — J439 Emphysema, unspecified: Secondary | ICD-10-CM

## 2019-11-18 DIAGNOSIS — F17210 Nicotine dependence, cigarettes, uncomplicated: Secondary | ICD-10-CM | POA: Insufficient documentation

## 2019-11-18 DIAGNOSIS — Z7951 Long term (current) use of inhaled steroids: Secondary | ICD-10-CM | POA: Insufficient documentation

## 2019-11-18 LAB — POCT GLYCOSYLATED HEMOGLOBIN (HGB A1C): Hemoglobin A1C: 5.6 % (ref 4.0–5.6)

## 2019-11-18 LAB — GLUCOSE, POCT (MANUAL RESULT ENTRY): POC Glucose: 125 mg/dl — AB (ref 70–99)

## 2019-11-18 MED ORDER — PREDNISONE 10 MG PO TABS
ORAL_TABLET | ORAL | 0 refills | Status: DC
Start: 2019-11-18 — End: 2019-12-28

## 2019-11-18 MED ORDER — INCRUSE ELLIPTA 62.5 MCG/INH IN AEPB: 1.0000 | INHALATION_SPRAY | Freq: Every day | RESPIRATORY_TRACT | 6 refills | Status: DC

## 2019-11-18 MED ORDER — IPRATROPIUM-ALBUTEROL 0.5-2.5 (3) MG/3ML IN SOLN: RESPIRATORY_TRACT | 1 refills | Status: DC

## 2019-11-18 MED ORDER — OMEPRAZOLE 40 MG PO CPDR: 40.0000 mg | DELAYED_RELEASE_CAPSULE | Freq: Two times a day (BID) | ORAL | 1 refills | Status: DC

## 2019-11-18 MED ORDER — ESCITALOPRAM OXALATE 20 MG PO TABS: 30.0000 mg | ORAL_TABLET | Freq: Every day | ORAL | 2 refills | Status: DC

## 2019-11-18 MED ORDER — NEBIVOLOL HCL 10 MG PO TABS: 10.0000 mg | ORAL_TABLET | Freq: Every day | ORAL | 4 refills | Status: DC

## 2019-11-18 MED ORDER — BREO ELLIPTA 200-25 MCG/INH IN AEPB: 1.0000 | INHALATION_SPRAY | Freq: Every day | RESPIRATORY_TRACT | 6 refills | Status: DC

## 2019-11-18 MED FILL — IPRAT-ALBUT 0.5-3(2.5) MG/3: 0.5-2.5 (3) | 30 days supply | Qty: 360 | Fill #0

## 2019-11-18 MED FILL — CYCLOBENZAPRINE 10 MG TAB: 10 | 10 days supply | Qty: 30 | Fill #0

## 2019-11-18 MED FILL — MONTELUKAST SOD 10 MG TAB: 10 | 30 days supply | Qty: 30 | Fill #0

## 2019-11-18 MED FILL — predniSONE 10 MG TABS: 10 | 5 days supply | Qty: 20 | Fill #0

## 2019-11-18 MED FILL — !BREO ELLIPTA 200-25 MCG: 200-25 | 30 days supply | Qty: 60 | Fill #0

## 2019-11-18 MED FILL — !INCRUSE ELLIPTA 62.5 MCG I: 62.5 | 30 days supply | Qty: 30 | Fill #0

## 2019-11-18 MED FILL — BYSTOLIC 10 MG TABLET: 10 | 30 days supply | Qty: 30 | Fill #0

## 2019-11-18 NOTE — Assessment & Plan Note (Signed)
As per COPD exacerbation note above

## 2019-11-18 NOTE — Progress Notes (Signed)
Here for COPD /HTN . And DM check / needs meds refills

## 2019-11-18 NOTE — Assessment & Plan Note (Signed)
Severe anxiety on Lexapro  Will increase dose to 30 mg daily

## 2019-11-18 NOTE — Assessment & Plan Note (Signed)
Emphysematous component noted

## 2019-11-18 NOTE — Assessment & Plan Note (Signed)
" >>  ASSESSMENT AND PLAN FOR COPD EXACERBATION (HCC) WRITTEN ON 11/18/2019 12:48 PM BY Crew Goren E, MD  As per COPD exacerbation note above "

## 2019-11-18 NOTE — Assessment & Plan Note (Signed)
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  We will change beta-blocker to Bystolic 10 mg daily for more selective beta blockade  We will give pulse prednisone 40 mg a day for 5 days  I spent 10 minutes with the patient with smoking cessation counseling to include mindfulness techniques and behavioral modification

## 2019-11-18 NOTE — Patient Instructions (Signed)
Use mindfulness thinking to stop smoking and as needed nicotine   Stay on Breo and Incruse, inhaler slowly  Increase Lexapro to 30mg  1.5 tabs daily  Use Duoneb twice a day  Take prednisone 10mg  Take 4 tablets daily for 5 days then stop  Stop labetalol  Start Bystolic one daily for blood pressure  Increase omeprazole to one twice daily before meal  Return to Dr Joya Gaskins in 2 months

## 2019-11-18 NOTE — Assessment & Plan Note (Signed)
Ongoing tobacco use  I spent over 10 minutes with smoking cessation counseling on this patient relative to her nicotine consumption

## 2019-11-18 NOTE — Progress Notes (Signed)
Subjective:    Patient ID: [name] [name], female    DOB: 05-15-1956, 64 y.o.   MRN: [id number]  64 y.o.F with COPD.  Here for ED f/u.   COPD dx x 75yr.  Has seen Dr YAnnamaria Bootsin the past.   Smoking < 1PPD.  Notes every three weeks with bronchitis.  On neb meds 2xperday Pt with nausea and emesis x one week.  No blood.   Has mold in apt.  No HVAC filter change WAllegra Grana  11/18/2019 Pt with Copd sent for referral by PCP.  Last seen by Dr WJoya Gaskins2018 This patient has a longstanding history of COPD with overlap asthma.  Pulmonary functions in 2011 showed FEV1 of 47% predicted FVC 91% predicted residual volume 145% predicted and 67% predicted diffusion capacity Note the patient still lives in a home that has issues with mold.  And the patient is still smoking about a pack a day of cigarettes as well.  Patient also follows with behavioral therapist for severe anxiety syndrome and is on Lexapro 20 mg daily.  She also is using a nonselective beta-blocker labetalol for blood pressure control.  She is on Breo and Incruse but inhales very quickly on these medications.  She was diagnosed with pneumonia in March 2020.  Patient's not on oxygen therapy.  Shortness of Breath This is a chronic problem. Associated symptoms include abdominal pain, headaches, rhinorrhea, a sore throat, sputum production, vomiting and wheezing. Pertinent negatives include no chest pain, ear pain, fever, hemoptysis, leg pain, leg swelling, orthopnea, PND, swollen glands or syncope. The symptoms are aggravated by weather changes, URIs, any activity, exercise, pollens, fumes, odors and lying flat. Associated symptoms comments: Mucus is yellow.  Notes heartburn severe Nausea in AM. Risk factors include smoking. She has tried beta agonist inhalers and oral steroids for the symptoms. The treatment provided moderate relief. Her past medical history is significant for allergies, asthma, CAD, COPD and pneumonia. There is no history of PE.    Past Medical History:  Diagnosis Date  . Anxiety    takes Lexapro daily  . Arthritis    "back, from neck down pass my bra area" (03/25/2017)  . Asthma   . Bartholin gland cyst 08/29/2011  . Bruises easily    pt is on Effient  . Chronic back pain    herniated nucleus pulposus  . Chronic back pain    "neck to bra area; lower back" (03/25/2017)  . COPD (chronic obstructive pulmonary disease) ([facility]    early stages  . Coronary artery disease   . Depression    takes Klonopin daily  . Diabetes mellitus without complication ([facility]   . Diverticulosis   . GERD (gastroesophageal reflux disease)    takes Nexium daily  . H/O hiatal hernia   . Heart attack ([facility] 2011  . Hemorrhoids   . Hernia   . Hyperlipidemia    takes Lipitor daily  . Hypertension    takes Losartan daily and Labetalol bid  . Hypothyroidism    takes Synthroid daily  . Insomnia    hydroxyzine prn  . Joint pain   . Pneumonia    "couple times" (03/25/2017)  . Pre-diabetes    "just found out 1 wk ago" (03/25/2017)  . Psoriasis    elbows,knees,back  . Shortness of breath    with exertion  . Slowing of urinary stream   . Stress incontinence      Family History  Problem Relation Age of Onset  .  Heart disease Mother   . Hypertension Mother   . Stroke Mother   . Mental illness Mother   . Heart disease Father   . Hypertension Father   . Diabetes Father   . Cancer Maternal Aunt        breast  . Thyroid disease Paternal Aunt   . Heart disease Maternal Grandfather   . Anesthesia problems Daughter   . Cancer Paternal Grandmother        mouth  . Hypotension Neg Hx   . Malignant hyperthermia Neg Hx   . Pseudochol deficiency Neg Hx   . Colon cancer Neg Hx      Social History   Socioeconomic History  . Marital status: Divorced    Spouse name: Not on file  . Number of children: Not on file  . Years of education: Not on file  . Highest education level: Not on file  Occupational History  . Not on file   Tobacco Use  . Smoking status: Current Every Day Smoker    Packs/day: 1.00    Years: 50.00    Pack years: 50.00    Types: Cigarettes  . Smokeless tobacco: Never Used  Substance and Sexual Activity  . Alcohol use: Yes    Comment: once a week  . Drug use: No    Types: Cocaine    Comment: has been years per pt  . Sexual activity: Not Currently    Birth control/protection: Surgical  Other Topics Concern  . Not on file  Social History Narrative  . Not on file   Social Determinants of Health   Financial Resource Strain:   . Difficulty of Paying Living Expenses:   Food Insecurity:   . Worried About Charity fundraiser in the Last Year:   . Arboriculturist in the Last Year:   Transportation Needs:   . Film/video editor (Medical):   Marland Kitchen Lack of Transportation (Non-Medical):   Physical Activity:   . Days of Exercise per Week:   . Minutes of Exercise per Session:   Stress:   . Feeling of Stress :   Social Connections:   . Frequency of Communication with Friends and Family:   . Frequency of Social Gatherings with Friends and Family:   . Attends Religious Services:   . Active Member of Clubs or Organizations:   . Attends Archivist Meetings:   Marland Kitchen Marital Status:   Intimate Partner Violence:   . Fear of Current or Ex-Partner:   . Emotionally Abused:   Marland Kitchen Physically Abused:   . Sexually Abused:      Allergies  Allergen Reactions  . Avocado Anaphylaxis  . Latex Shortness Of Breath and Rash  . Codeine Nausea Only     Outpatient Medications Prior to Visit  Medication Sig Dispense Refill  . albuterol (VENTOLIN HFA) 108 (90 Base) MCG/ACT inhaler INHALE 2 PUFFS INTO THE LUNGS EVERY 6 (SIX) HOURS AS NEEDED FOR WHEEZING OR SHORTNESS OF BREATH. 18 g 2  . amLODipine (NORVASC) 5 MG tablet Take 1 tablet (5 mg total) by mouth daily. 90 tablet 3  . aspirin EC 81 MG tablet Take 81 mg by mouth daily.    Marland Kitchen atorvastatin (LIPITOR) 80 MG tablet Take 1 tablet (80 mg total) by  mouth daily. 90 tablet 3  . Blood Glucose Monitoring Suppl (TRUE METRIX METER) DEVI 1 kit by Does not apply route 4 (four) times daily. 1 Device 0  . busPIRone (BUSPAR) 15 MG  tablet Take 1 tablet (15 mg total) by mouth 3 (three) times daily. 270 tablet 1  . cetirizine (ZYRTEC) 10 MG tablet Take 10 mg by mouth daily.    . cyclobenzaprine (FLEXERIL) 10 MG tablet TAKE 1 TABLET BY MOUTH 3 TIMES DAILY AS NEEDED. 30 tablet 1  . Dextromethorphan-guaiFENesin (WAL-TUSSIN DM PO) Take 5 mLs by mouth 3 (three) times daily as needed (cough).    . diphenhydramine-acetaminophen (TYLENOL PM) 25-500 MG TABS tablet Take 1 tablet by mouth at bedtime as needed (sleep).    Marland Kitchen doxepin (SINEQUAN) 50 MG capsule TAKE 1 CAPSULE (50 MG TOTAL) BY MOUTH AT BEDTIME. 30 capsule 2  . fluticasone (FLONASE) 50 MCG/ACT nasal spray Place 2 sprays into both nostrils daily. (Patient taking differently: Place 2 sprays into both nostrils at bedtime. ) 16 g 6  . furosemide (LASIX) 20 MG tablet TAKE 1 TABLET (20 MG TOTAL) BY MOUTH DAILY. 30 tablet 2  . gabapentin (NEURONTIN) 600 MG tablet TAKE 1 TABLET BY MOUTH 4 TIMES DAILY (Patient taking differently: Take 600 mg by mouth as needed. ) 120 tablet 3  . glucose blood (TRUE METRIX BLOOD GLUCOSE TEST) test strip Use as instructed 100 each 12  . levothyroxine (SYNTHROID) 100 MCG tablet TAKE 1 TABLET (100 MCG TOTAL) BY MOUTH DAILY. 30 tablet 2  . losartan (COZAAR) 50 MG tablet TAKE 1.5 TABLETS (75 MG TOTAL) BY MOUTH DAILY. NEEDS APPOINTMENT WITH PCP. 45 tablet 3  . metFORMIN (GLUCOPHAGE) 500 MG tablet Take 1 tablet (500 mg total) by mouth 2 (two) times daily with a meal. 180 tablet 3  . mirtazapine (REMERON) 30 MG tablet Take 1 tablet (30 mg total) by mouth at bedtime. 90 tablet 1  . montelukast (SINGULAIR) 10 MG tablet TAKE 1 TABLET BY MOUTH AT BEDTIME. 30 tablet 2  . nitroGLYCERIN (NITROSTAT) 0.4 MG SL tablet Place 1 tablet (0.4 mg total) under the tongue every 5 (five) minutes as needed for  chest pain. 90 tablet 3  . ticagrelor (BRILINTA) 90 MG TABS tablet TAKE 1 TABLET BY MOUTH 2 (TWO) TIMES DAILY. (Patient taking differently: Take 90 mg by mouth 2 (two) times daily. ) 60 tablet 6  . TRUEPLUS LANCETS 28G MISC 28 g by Does not apply route QID. 120 each 2  . BREO ELLIPTA 200-25 MCG/INH AEPB INHALE 1 PUFF INTO THE LUNGS DAILY. 60 each 2  . escitalopram (LEXAPRO) 20 MG tablet Take 1 tablet (20 mg total) by mouth daily. 30 tablet 2  . ipratropium-albuterol (DUONEB) 0.5-2.5 (3) MG/3ML SOLN TAKE 3 MLS VIA NEBULIZATION EVERY 6 HOURS 360 mL 1  . labetalol (NORMODYNE) 100 MG tablet TAKE 1 TABLET BY MOUTH 2 (TWO) TIMES DAILY. 60 tablet 2  . omeprazole (PRILOSEC) 40 MG capsule TAKE 1 CAPSULE (40 MG TOTAL) BY MOUTH DAILY. 30 capsule 1  . umeclidinium bromide (INCRUSE ELLIPTA) 62.5 MCG/INH AEPB Inhale 1 puff into the lungs daily. 1 each 6  . Cholecalciferol (VITAMIN D) 50 MCG (2000 UT) CAPS Take 2,000 Units by mouth daily.    . nicotine (NICODERM CQ - DOSED IN MG/24 HOURS) 21 mg/24hr patch Place 1 patch (21 mg total) onto the skin daily. (Patient not taking: Reported on 11/08/2019) 28 patch 1  . ondansetron (ZOFRAN) 4 MG tablet Take 1 tablet (4 mg total) by mouth every 8 (eight) hours as needed for nausea or vomiting. (Patient not taking: Reported on 11/18/2019) 20 tablet 0  . triamcinolone cream (KENALOG) 0.1 % APPLY 1 APPLICATION TOPICALLY 2 TIMES  DAILY. 30 g 1  . Vitamin E 180 MG CAPS Take 180 mg by mouth daily.     No facility-administered medications prior to visit.     Review of Systems  Constitutional: Negative for fever.  HENT: Positive for rhinorrhea and sore throat. Negative for ear pain.   Respiratory: Positive for sputum production, shortness of breath and wheezing. Negative for hemoptysis.   Cardiovascular: Negative for chest pain, orthopnea, leg swelling, syncope and PND.  Gastrointestinal: Positive for abdominal pain and vomiting.  Neurological: Positive for headaches.         Objective:   Physical Exam Vitals:   11/18/19 0852  BP: (!) 149/77  Pulse: 69  Resp: 19  Temp: 98 F (36.7 C)  SpO2: 98%  Weight: 142 lb (64.4 kg)  Height: '5\' 1"'  (1.549 m)    SRP:RXYVOPF WF , in no distress, depressed affect  ENT: No lesions,  mouth clear,  oropharynx clear, no postnasal drip  Neck: No JVD, no TMG, no carotid bruits  Lungs: No use of accessory muscles, no dullness to percussion, expiratory wheezes, poor airflow  Cardiovascular: RRR, heart sounds normal, no murmur or gallops, no peripheral edema  Abdomen: soft   Tender in epigastric area, no rebound or guarding,  no HSM,  BS normal  Musculoskeletal: No deformities, no cyanosis or clubbing  Neuro: alert, non focal  Skin: Warm, no lesions or rashes  No results found. PFTs 2011: FeV1 47%  FVC 90%  DLCO 67% GOLD C  9/11 CXR reviewed .  NAD.  No lung mass, copd changes 9/13 BMET Cr 0.9  K 4.3  Hgb 12.9  WBC 14.9     Assessment & Plan:  I personally reviewed all images and lab data in the [facility] system as well as any outside material available during this office visit and agree with the  radiology impressions.   COPD (chronic obstructive pulmonary disease) (HCC)GOLD C  Chronic obstructive lung disease with emphysematous and asthmatic component now with acute exacerbation  Ongoing tobacco use  Plan is to continue Breo 1 inhalation daily and Incruse 1 inhalation daily.  We will change beta-blocker to Bystolic 10 mg daily for more selective beta blockade  We will give pulse prednisone 40 mg a day for 5 days  I spent 10 minutes with the patient with smoking cessation counseling to include mindfulness techniques and behavioral modification  COPD exacerbation ([place]) As per COPD exacerbation note above  Emphysema lung (HCC) Emphysematous component noted  TOBACCO ABUSE Ongoing tobacco use  I spent over 10 minutes with smoking cessation counseling on this patient relative to her nicotine  consumption  Anxiety and depression Severe anxiety on Lexapro  Will increase dose to 30 mg daily   Diagnoses and all orders for this visit:  Type 2 diabetes mellitus with hyperglycemia, without long-term current use of insulin (HCC) -     POCT glucose (manual entry) -     HgB A1c  COPD with chronic bronchitis (HCC) -     fluticasone furoate-vilanterol (BREO ELLIPTA) 200-25 MCG/INH AEPB; Inhale 1 puff into the lungs daily. -     ipratropium-albuterol (DUONEB) 0.5-2.5 (3) MG/3ML SOLN; TAKE 3 MLS VIA NEBULIZATION EVERY 6 HOURS  Gastroesophageal reflux disease without esophagitis -     omeprazole (PRILOSEC) 40 MG capsule; Take 1 capsule (40 mg total) by mouth 2 (two) times daily before a meal.  Anxiety and depression -     escitalopram (LEXAPRO) 20 MG tablet; Take 1.5 tablets (30 mg  total) by mouth daily.  Pulmonary emphysema, unspecified emphysema type ([place])  COPD exacerbation (HCC)  Centrilobular emphysema (HCC)  TOBACCO ABUSE  Other orders -     umeclidinium bromide (INCRUSE ELLIPTA) 62.5 MCG/INH AEPB; Inhale 1 puff into the lungs daily. -     nebivolol (BYSTOLIC) 10 MG tablet; Take 1 tablet (10 mg total) by mouth daily. -     predniSONE (DELTASONE) 10 MG tablet; Take 4 tablets daily for 5 days then stop    I had an extended discussion with the patient and or family lasting 10 minutes of a 25 minute visit including: smoking cessation, reviewed all meds,

## 2019-11-23 ENCOUNTER — Other Ambulatory Visit: Payer: Self-pay

## 2019-11-23 ENCOUNTER — Ambulatory Visit (INDEPENDENT_AMBULATORY_CARE_PROVIDER_SITE_OTHER): Payer: Self-pay | Admitting: Licensed Clinical Social Worker

## 2019-11-23 ENCOUNTER — Telehealth (HOSPITAL_COMMUNITY): Payer: Self-pay

## 2019-11-23 ENCOUNTER — Encounter (HOSPITAL_COMMUNITY): Payer: Self-pay | Admitting: Licensed Clinical Social Worker

## 2019-11-23 DIAGNOSIS — F411 Generalized anxiety disorder: Secondary | ICD-10-CM

## 2019-11-23 DIAGNOSIS — F32A Depression, unspecified: Secondary | ICD-10-CM

## 2019-11-23 DIAGNOSIS — F329 Major depressive disorder, single episode, unspecified: Secondary | ICD-10-CM

## 2019-11-23 NOTE — Telephone Encounter (Signed)
Encounter complete. 

## 2019-11-23 NOTE — Progress Notes (Addendum)
Virtual Visit via Phone Note  I connected with [name] [name] on 11/23/19 at 12:00 pm EDT by phone enabled telemedicine application and verified that I am speaking with the correct person using two identifiers. Pt does not have a computer or internet.   I discussed the limitations of evaluation and management by telemedicine and the availability of in person appointments. The patient expressed understanding and agreed to proceed.  History of Present Illness: Pt was referred to OP therapy by Psychiatrist, Dr. Casimiro Needle, for her anxiety, depression and Bipolar disorder.     Observations/Objective:  Pt presents anxious for her counseling session by phone-enabled telemedicine. Pt discussed her psychiatric symptoms and current life events. Reviewed tx plan with patient who verbalized acceptance of the plan. Patient reports she has increased anxiety symptoms due to a stress test she's having Thursday. Used socratic questions. Patient reports she had a hear attack 14 years ago, due to her crack cocaine use. Cln provided psychoeducation on crack cocaine and effects on the heart. Patient reports she still continues to have anxiety. Reviewed her medications with her and assisted patient with making notes for her appointment with Dr. Casimiro Needle, psychiatrist tomorrow. Patient reports she has a gym at her apartment. Suggested to patient to talk to her cardiologist about what equipment she can use in the gym. Assisted patient with making a list of questions for cardiologist. Patient provided an update on her grief process.   Assessment and Plan: Counselor will continue to meet with patient to address treatment plan goals. Patient will continue to follow recommendations of providers and implement skills learned in session.    Follow Up Instructions:  Counselor discussed the assessment and treatment plan with the patient. The patient was provided an opportunity to ask questions and all were answered. The patient agreed  with the plan and demonstrated an understanding of the instructions.   The patient was advised to call back or seek an in-person evaluation if the symptoms worsen or if the condition fails to improve as anticipated.  I provided  45 minutes of non-face-to-face time during this encounter.   Kellee Sittner S, LCAS

## 2019-11-24 ENCOUNTER — Ambulatory Visit (HOSPITAL_COMMUNITY): Payer: Medicaid Other | Admitting: Licensed Clinical Social Worker

## 2019-11-24 ENCOUNTER — Telehealth (HOSPITAL_COMMUNITY): Payer: Self-pay | Admitting: Psychiatry

## 2019-11-24 ENCOUNTER — Other Ambulatory Visit: Payer: Self-pay

## 2019-11-25 ENCOUNTER — Ambulatory Visit (HOSPITAL_COMMUNITY)
Admission: RE | Admit: 2019-11-25 | Discharge: 2019-11-25 | Disposition: A | Discharge status: Home / Self Care | Payer: Self-pay | Source: Ambulatory Visit | Attending: Cardiology | Admitting: Cardiology

## 2019-11-25 ENCOUNTER — Other Ambulatory Visit: Payer: Self-pay

## 2019-11-25 DIAGNOSIS — I25119 Atherosclerotic heart disease of native coronary artery with unspecified angina pectoris: Secondary | ICD-10-CM | POA: Insufficient documentation

## 2019-11-25 DIAGNOSIS — R079 Chest pain, unspecified: Secondary | ICD-10-CM | POA: Insufficient documentation

## 2019-11-25 MED ORDER — TECHNETIUM TC 99M TETROFOSMIN IV KIT
31.7000 | PACK | Freq: Once | INTRAVENOUS | Status: AC | PRN
  Administered 2019-11-25: 31.7 via INTRAVENOUS
  Filled 2019-11-25: qty 32

## 2019-11-25 MED ORDER — REGADENOSON 0.4 MG/5ML IV SOLN
0.4000 mg | Freq: Once | INTRAVENOUS | Status: AC
Start: 2019-11-25 — End: 2019-11-25
  Administered 2019-11-25: 0.4 mg via INTRAVENOUS

## 2019-11-25 MED ORDER — AMINOPHYLLINE 25 MG/ML IV SOLN
75.0000 mg | Freq: Once | INTRAVENOUS | Status: AC
  Administered 2019-11-25: 75 mg via INTRAVENOUS

## 2019-11-25 MED ORDER — TECHNETIUM TC 99M TETROFOSMIN IV KIT
10.5000 | PACK | Freq: Once | INTRAVENOUS | Status: AC | PRN
  Administered 2019-11-25: 10.5 via INTRAVENOUS
  Filled 2019-11-25: qty 11

## 2019-11-26 ENCOUNTER — Encounter (HOSPITAL_COMMUNITY): Payer: Medicaid Other

## 2019-11-26 LAB — MYOCARDIAL PERFUSION IMAGING
LV dias vol: 85 mL (ref 46–106)
LV sys vol: 27 mL
Peak HR: 86 {beats}/min
Rest HR: 71 {beats}/min
SDS: 1
SRS: 6
SSS: 7
TID: 1.19

## 2019-12-08 ENCOUNTER — Other Ambulatory Visit: Payer: Self-pay

## 2019-12-08 ENCOUNTER — Ambulatory Visit (INDEPENDENT_AMBULATORY_CARE_PROVIDER_SITE_OTHER): Payer: Self-pay | Admitting: Licensed Clinical Social Worker

## 2019-12-08 ENCOUNTER — Encounter (HOSPITAL_COMMUNITY): Payer: Self-pay | Admitting: Licensed Clinical Social Worker

## 2019-12-08 DIAGNOSIS — F411 Generalized anxiety disorder: Secondary | ICD-10-CM

## 2019-12-08 DIAGNOSIS — F32A Depression, unspecified: Secondary | ICD-10-CM

## 2019-12-08 DIAGNOSIS — F329 Major depressive disorder, single episode, unspecified: Secondary | ICD-10-CM

## 2019-12-08 NOTE — Progress Notes (Signed)
Virtual Visit via Phone Note  I connected with [name] [name] on 12/08/19 at 12:00 pm EDT by phone enabled telemedicine application and verified that I am speaking with the correct person using two identifiers. Pt does not have a computer or internet.   I discussed the limitations of evaluation and management by telemedicine and the availability of in person appointments. The patient expressed understanding and agreed to proceed.  LOCATION: Patient: Home Provider: Home  History of Present Illness: Pt was referred to OP therapy by Psychiatrist, Dr. Casimiro Needle, for her anxiety, depression and Bipolar disorder.     Observations/Objective:  Pt presents WNL for her counseling session by phone-enabled telemedicine. Pt discussed her psychiatric symptoms and current life events. Patient reports, "i'm at the beach with my daughter!" Asked open ended questions. Pt reports on the panic attacks she's had since being at the beach. Cln assisted patient in identifying  triggers for her panic attacks and coping skills. Cln showed patient how to use a mindfulness breathing exercise, pt practiced in session. Pt described her stress test, where she also had a panic attack. "I thought I was gonna have a heart attack, but the dr says my heart is good." Pt described her COPD symptoms having increased. Cln provided psychoeducation on COPD exacerbated by her continued cigarette use. Again, provided psychoeducation on tobacco cessation. Pt seemed more receptive to tobacco cessation.   PLAN: CCA, grief  Assessment and Plan: Counselor will continue to meet with patient to address treatment plan goals. Patient will continue to follow recommendations of providers and implement skills learned in session.    Follow Up Instructions:  Counselor discussed the assessment and treatment plan with the patient. The patient was provided an opportunity to ask questions and all were answered. The patient agreed with the plan and  demonstrated an understanding of the instructions.   The patient was advised to call back or seek an in-person evaluation if the symptoms worsen or if the condition fails to improve as anticipated.  I provided  45 minutes of non-face-to-face time during this encounter.   [name] [name] S, LCAS

## 2019-12-09 ENCOUNTER — Other Ambulatory Visit: Payer: Self-pay

## 2019-12-09 ENCOUNTER — Telehealth (INDEPENDENT_AMBULATORY_CARE_PROVIDER_SITE_OTHER): Payer: Self-pay | Admitting: Psychiatry

## 2019-12-09 DIAGNOSIS — F411 Generalized anxiety disorder: Secondary | ICD-10-CM

## 2019-12-09 DIAGNOSIS — F329 Major depressive disorder, single episode, unspecified: Secondary | ICD-10-CM

## 2019-12-09 DIAGNOSIS — F419 Anxiety disorder, unspecified: Secondary | ICD-10-CM

## 2019-12-09 DIAGNOSIS — F32A Depression, unspecified: Secondary | ICD-10-CM

## 2019-12-09 MED ORDER — MIRTAZAPINE 15 MG PO TABS: 30.0000 mg | ORAL_TABLET | Freq: Every day | ORAL | 1 refills | Status: DC

## 2019-12-09 MED ORDER — BUSPIRONE HCL 15 MG PO TABS: ORAL_TABLET | ORAL | 3 refills | Status: DC

## 2019-12-09 MED ORDER — ESCITALOPRAM OXALATE 10 MG PO TABS: 30.0000 mg | ORAL_TABLET | Freq: Every day | ORAL | 3 refills | Status: DC

## 2019-12-09 NOTE — Progress Notes (Signed)
[facility] MD/PA/NP OP Progress Note  12/09/2019 4:34 PM [name] [name]  MRN:  [id number]  Chief Complaint: Anxiety HPI:  Today the patient says she is having multiple anxiety attacks.  This is very different than the way she was 6 months ago.  I am not quite sure why it has taken 6 months to get back but the patient 1 presentation 6 months ago was actually doing very well.  She is unable to describe what is changed other than the fact that her dog has died.  He died about a month ago.  Patient says she is having trouble sleeping.  She gets short of breath.  She has COPD.  She is on inhalers.  She is not on oxygen.  She says he is having problems with breathing and that she gets anxious.  I suspect that her COPD is worsening and that is playing a part in this.  I am hesitant to prescribe benzodiazepines in this patient.  On her last visit we increased her Remeron and she saw that she felt much better.  Her biggest complaint is that she is not sleeping and that she is anxious through the day.  The patient was unable to reach by phone call us back and ended up having a very short 10-minute visit.  It is noted that she is coming back from a trip to [place].  So she has been on medication and she said during vacation she had no anxiety.  Therefore I think she has the capacity to reduce her anxiety.  The patient is compliant and that she is in therapy at this time.  Unfortunately she does not know the doses of all her medicines as they are in a suitcase in the trunk.  Nonetheless at this time the patient is functioning only fairly well.  She is close with her daughter.   ICD-10-CM   1. GAD (generalized anxiety disorder)  F41.1 busPIRone (BUSPAR) 15 MG tablet    mirtazapine (REMERON) 15 MG tablet  2. Depression, unspecified depression type  F32.9 mirtazapine (REMERON) 15 MG tablet  3. Anxiety and depression  F41.9 escitalopram (LEXAPRO) 10 MG tablet   F32.9     Past Psychiatric History: history of  substance use, crack cocaine addiction, alcohol use disorder  Past Medical History:  Past Medical History:  Diagnosis Date  . Anxiety    takes Lexapro daily  . Arthritis    "back, from neck down pass my bra area" (03/25/2017)  . Asthma   . Bartholin gland cyst 08/29/2011  . Bruises easily    pt is on Effient  . Chronic back pain    herniated nucleus pulposus  . Chronic back pain    "neck to bra area; lower back" (03/25/2017)  . COPD (chronic obstructive pulmonary disease) ([place])    early stages  . Coronary artery disease   . Depression    takes Klonopin daily  . Diabetes mellitus without complication ([place])   . Diverticulosis   . GERD (gastroesophageal reflux disease)    takes Nexium daily  . H/O hiatal hernia   . Heart attack ([place]) 2011  . Hemorrhoids   . Hernia   . Hyperlipidemia    takes Lipitor daily  . Hypertension    takes Losartan daily and Labetalol bid  . Hypothyroidism    takes Synthroid daily  . Insomnia    hydroxyzine prn  . Joint pain   . Pneumonia    "couple times" (03/25/2017)  .  Pre-diabetes    "just found out 1 wk ago" (03/25/2017)  . Psoriasis    elbows,knees,back  . Shortness of breath    with exertion  . Slowing of urinary stream   . Stress incontinence     Past Surgical History:  Procedure Laterality Date  . ABDOMINAL EXPLORATION SURGERY  1977  . ABDOMINAL HYSTERECTOMY  1977   "left one of my ovaries"  . BACK SURGERY    . BIOPSY  05/27/2019   Procedure: BIOPSY;  Surgeon: Daneil Dolin, MD;  [facility];  Service: Endoscopy;;  . COLONOSCOPY  2011   Dr. Ardis Hughs: Mild diverticulosis, descending diminutive colon polyp (not retrieved), next colonoscopy 10 years  . CORONARY ANGIOPLASTY WITH STENT PLACEMENT  2011 X2   "regular stents didn't work; had to go back in in ~ 1 month and put in medicated stents"  . DILATION AND CURETTAGE OF UTERUS    . ESOPHAGOGASTRODUODENOSCOPY    . ESOPHAGOGASTRODUODENOSCOPY (EGD) WITH PROPOFOL N/A  05/27/2019   normal esophagus, dilation, erosive gastropathy s/p biopsy, normal duodenum. Negative H.pylori.   Marland Kitchen LEFT HEART CATH AND CORONARY ANGIOGRAPHY N/A 03/26/2017   Procedure: LEFT HEART CATH AND CORONARY ANGIOGRAPHY;  Surgeon: Belva Crome, MD;  Location: [facility];  Service: Cardiovascular;  Laterality: N/A;  . LUMBAR LAMINECTOMY/DECOMPRESSION MICRODISCECTOMY  08/05/2011   Procedure: LUMBAR LAMINECTOMY/DECOMPRESSION MICRODISCECTOMY;  Surgeon: Otilio Connors, MD;  Location: [facility];  Service: Neurosurgery;  Laterality: Right;  Right Lumbar four-five extraforaminal discectomy  . MALONEY DILATION N/A 05/27/2019   Procedure: Venia Minks DILATION;  Surgeon: Daneil Dolin, MD;  [facility];  Service: Endoscopy;  Laterality: N/A;  54  . TONSILLECTOMY     as a child    Family Psychiatric History: See intake H&P for full details. Reviewed, with no updates at this time.   Family History:  Family History  Problem Relation Age of Onset  . Heart disease Mother   . Hypertension Mother   . Stroke Mother   . Mental illness Mother   . Heart disease Father   . Hypertension Father   . Diabetes Father   . Cancer Maternal Aunt        breast  . Thyroid disease Paternal Aunt   . Heart disease Maternal Grandfather   . Anesthesia problems Daughter   . Cancer Paternal Grandmother        mouth  . Hypotension Neg Hx   . Malignant hyperthermia Neg Hx   . Pseudochol deficiency Neg Hx   . Colon cancer Neg Hx     Social History:  Social History   Socioeconomic History  . Marital status: Divorced    Spouse name: Not on file  . Number of children: Not on file  . Years of education: Not on file  . Highest education level: Not on file  Occupational History  . Not on file  Tobacco Use  . Smoking status: Current Every Day Smoker    Packs/day: 1.00    Years: 50.00    Pack years: 50.00    Types: Cigarettes  . Smokeless tobacco: Never Used  Substance and Sexual Activity   . Alcohol use: Yes    Comment: once a week  . Drug use: No    Types: Cocaine    Comment: has been years per pt  . Sexual activity: Not Currently    Birth control/protection: Surgical  Other Topics Concern  . Not on file  Social History Narrative  .  Not on file   Social Determinants of Health   Financial Resource Strain:   . Difficulty of Paying Living Expenses:   Food Insecurity:   . Worried About Charity fundraiser in the Last Year:   . Arboriculturist in the Last Year:   Transportation Needs:   . Film/video editor (Medical):   Marland Kitchen Lack of Transportation (Non-Medical):   Physical Activity:   . Days of Exercise per Week:   . Minutes of Exercise per Session:   Stress:   . Feeling of Stress :   Social Connections:   . Frequency of Communication with Friends and Family:   . Frequency of Social Gatherings with Friends and Family:   . Attends Religious Services:   . Active Member of Clubs or Organizations:   . Attends Archivist Meetings:   Marland Kitchen Marital Status:     Allergies:  Allergies  Allergen Reactions  . Avocado Anaphylaxis  . Latex Shortness Of Breath and Rash  . Codeine Nausea Only    Metabolic Disorder Labs: Lab Results  Component Value Date   HGBA1C 5.6 11/18/2019   No results found for: PROLACTIN Lab Results  Component Value Date   CHOL 185 04/05/2019   TRIG 104 04/05/2019   HDL 67 04/05/2019   CHOLHDL 2.8 04/05/2019   VLDL 15 04/16/2016   LDLCALC 99 04/05/2019   LDLCALC 68 09/17/2018   Lab Results  Component Value Date   TSH 0.352 (L) 04/05/2019   TSH 12.130 (H) 09/17/2018    Therapeutic Level Labs: No results found for: LITHIUM No results found for: VALPROATE No components found for:  CBMZ  Current Medications: Current Outpatient Medications  Medication Sig Dispense Refill  . albuterol (VENTOLIN HFA) 108 (90 Base) MCG/ACT inhaler INHALE 2 PUFFS INTO THE LUNGS EVERY 6 (SIX) HOURS AS NEEDED FOR WHEEZING OR SHORTNESS OF  BREATH. 18 g 2  . amLODipine (NORVASC) 5 MG tablet Take 1 tablet (5 mg total) by mouth daily. 90 tablet 3  . aspirin EC 81 MG tablet Take 81 mg by mouth daily.    Marland Kitchen atorvastatin (LIPITOR) 80 MG tablet Take 1 tablet (80 mg total) by mouth daily. 90 tablet 3  . Blood Glucose Monitoring Suppl (TRUE METRIX METER) DEVI 1 kit by Does not apply route 4 (four) times daily. 1 Device 0  . busPIRone (BUSPAR) 15 MG tablet 2  qam   1  q noon  And 1 qhs 120 tablet 3  . cetirizine (ZYRTEC) 10 MG tablet Take 10 mg by mouth daily.    . Cholecalciferol (VITAMIN D) 50 MCG (2000 UT) CAPS Take 2,000 Units by mouth daily.    . cyclobenzaprine (FLEXERIL) 10 MG tablet TAKE 1 TABLET BY MOUTH 3 TIMES DAILY AS NEEDED. 30 tablet 1  . Dextromethorphan-guaiFENesin (WAL-TUSSIN DM PO) Take 5 mLs by mouth 3 (three) times daily as needed (cough).    . diphenhydramine-acetaminophen (TYLENOL PM) 25-500 MG TABS tablet Take 1 tablet by mouth at bedtime as needed (sleep).    Marland Kitchen doxepin (SINEQUAN) 50 MG capsule TAKE 1 CAPSULE (50 MG TOTAL) BY MOUTH AT BEDTIME. 30 capsule 2  . escitalopram (LEXAPRO) 10 MG tablet Take 3 tablets (30 mg total) by mouth daily. 30 tablet 3  . fluticasone (FLONASE) 50 MCG/ACT nasal spray Place 2 sprays into both nostrils daily. (Patient taking differently: Place 2 sprays into both nostrils at bedtime. ) 16 g 6  . fluticasone furoate-vilanterol (BREO ELLIPTA) 200-25 MCG/INH AEPB  Inhale 1 puff into the lungs daily. 60 each 6  . furosemide (LASIX) 20 MG tablet TAKE 1 TABLET (20 MG TOTAL) BY MOUTH DAILY. 30 tablet 2  . gabapentin (NEURONTIN) 600 MG tablet TAKE 1 TABLET BY MOUTH 4 TIMES DAILY (Patient taking differently: Take 600 mg by mouth as needed. ) 120 tablet 3  . glucose blood (TRUE METRIX BLOOD GLUCOSE TEST) test strip Use as instructed 100 each 12  . ipratropium-albuterol (DUONEB) 0.5-2.5 (3) MG/3ML SOLN TAKE 3 MLS VIA NEBULIZATION EVERY 6 HOURS 360 mL 1  . levothyroxine (SYNTHROID) 100 MCG tablet TAKE 1  TABLET (100 MCG TOTAL) BY MOUTH DAILY. 30 tablet 2  . losartan (COZAAR) 50 MG tablet TAKE 1.5 TABLETS (75 MG TOTAL) BY MOUTH DAILY. NEEDS APPOINTMENT WITH PCP. 45 tablet 3  . metFORMIN (GLUCOPHAGE) 500 MG tablet Take 1 tablet (500 mg total) by mouth 2 (two) times daily with a meal. 180 tablet 3  . mirtazapine (REMERON) 15 MG tablet Take 2 tablets (30 mg total) by mouth at bedtime. 90 tablet 1  . montelukast (SINGULAIR) 10 MG tablet TAKE 1 TABLET BY MOUTH AT BEDTIME. 30 tablet 2  . nebivolol (BYSTOLIC) 10 MG tablet Take 1 tablet (10 mg total) by mouth daily. 30 tablet 4  . nicotine (NICODERM CQ - DOSED IN MG/24 HOURS) 21 mg/24hr patch Place 1 patch (21 mg total) onto the skin daily. (Patient not taking: Reported on 11/08/2019) 28 patch 1  . nitroGLYCERIN (NITROSTAT) 0.4 MG SL tablet Place 1 tablet (0.4 mg total) under the tongue every 5 (five) minutes as needed for chest pain. 90 tablet 3  . omeprazole (PRILOSEC) 40 MG capsule Take 1 capsule (40 mg total) by mouth 2 (two) times daily before a meal. 60 capsule 1  . ondansetron (ZOFRAN) 4 MG tablet Take 1 tablet (4 mg total) by mouth every 8 (eight) hours as needed for nausea or vomiting. (Patient not taking: Reported on 11/18/2019) 20 tablet 0  . predniSONE (DELTASONE) 10 MG tablet Take 4 tablets daily for 5 days then stop 20 tablet 0  . ticagrelor (BRILINTA) 90 MG TABS tablet TAKE 1 TABLET BY MOUTH 2 (TWO) TIMES DAILY. (Patient taking differently: Take 90 mg by mouth 2 (two) times daily. ) 60 tablet 6  . triamcinolone cream (KENALOG) 0.1 % APPLY 1 APPLICATION TOPICALLY 2 TIMES DAILY. 30 g 1  . TRUEPLUS LANCETS 28G MISC 28 g by Does not apply route QID. 120 each 2  . umeclidinium bromide (INCRUSE ELLIPTA) 62.5 MCG/INH AEPB Inhale 1 puff into the lungs daily. 1 each 6  . Vitamin E 180 MG CAPS Take 180 mg by mouth daily.     No current facility-administered medications for this visit.    Musculoskeletal: Strength & Muscle Tone: within normal  limits Gait & Station: normal Patient leans: N/A  Psychiatric Specialty Exam: ROS  There were no vitals taken for this visit.There is no height or weight on file to calculate BMI.  General Appearance: Casual and Fairly Groomed  Eye Contact:  Fair  Speech:  Clear and Coherent and Normal Rate  Volume:  Normal  Mood:  Anxious and Depressed  Affect:  Appropriate and Congruent  Thought Process:  Goal Directed and Descriptions of Associations: Intact  Orientation:  Full (Time, Place, and Person)  Thought Content: Logical   Suicidal Thoughts:  No  Homicidal Thoughts:  No  Memory:  Immediate;   Good  Judgement:  Fair  Insight:  Fair  Psychomotor Activity:  Normal  Concentration:  Concentration: Fair  Recall:  [employer] of Knowledge: Fair  Language: Fair  Akathisia:  Negative  Handed:  Right  AIMS (if indicated): not done  Assets:  Communication Skills Desire for Improvement Housing  ADL's:  Intact  Cognition: WNL  Sleep:  Poor   Screenings: AUDIT     Admission (Discharged) from 07/10/2013 in [place] [facility]  Alcohol Use Disorder Identification Test Final Score (AUDIT)  12    GAD-7     Office Visit from 11/18/2019 in [facility] Office Visit from 09/06/2019 in [facility] Office Visit from 12/25/2017 in [facility] Counselor from 12/22/2017 in [facility] Office Visit from 09/18/2017 in [facility]  Total GAD-7 Score  7  0  _0 PHQ2-9     Office Visit from 11/18/2019 in [facility] Office Visit from 09/06/2019 in [facility] Office Visit from 03/25/2019 in [facility] Office Visit from 12/25/2017 in [facility] Counselor from 12/22/2017 in [facility]  PHQ-2 Total Score  2  0  0  6  6  PHQ-9 Total Score  6  --  0  24  23       Assessment and Plan:   This patient is diagnosis is likely that of generalized anxiety disorder.  At this time we will increase her BuSpar to what I considered to be the maximum dose.  She will take 15 mg taking 2 in the morning 1 midday and 1 at night.  The patient will continue taking doxepin at night for sleep.  Today we will reduce her Lexapro actually to a more appropriate dose of 10 mg given the fact that she is 64 years of age.  Also asked to reduce her Remeron down to 15 mg because I think it will be somewhat more sedating and help her sleep.  So in addition to generalized anxiety disorder her second problem is insomnia.  This patient will be hopefully reevaluated in the next few months.  1. GAD (generalized anxiety disorder)   2. Depression, unspecified depression type   3. Anxiety and depression     Status of current problems: new to [employer] Ordered: No orders of the defined types were placed in this encounter.   Labs Reviewed: na  Collateral Obtained/Records Reviewed: Reviewed notes from Dr. Adele Schilder  Plan:  Increase Lexapro to 20 mg Increase BuSpar to 15 mg 3 times daily Seroquel discontinued by patient given  restless legs and leg cramps Initiate Remeron 15 mg nightly Continue individual therapy and palliative/hospice grief counselor   Jerral Ralph, MD 12/09/2019, 4:34 PM

## 2019-12-10 MED FILL — MIRTAZAPINE 15 MG TABLET: 15 | 30 days supply | Qty: 60 | Fill #0

## 2019-12-10 MED FILL — ESCITALOPRAM 10 MG TABLET: 10 | 10 days supply | Qty: 30 | Fill #0

## 2019-12-10 MED FILL — busPIRone HCL 15 MG TABS: 15 | 30 days supply | Qty: 120 | Fill #0

## 2019-12-14 ENCOUNTER — Other Ambulatory Visit: Payer: Self-pay | Admitting: Internal Medicine

## 2019-12-14 DIAGNOSIS — J449 Chronic obstructive pulmonary disease, unspecified: Secondary | ICD-10-CM

## 2019-12-14 MED FILL — OMEPRAZOLE DR 40 MG CAPSULE: 40 | 30 days supply | Qty: 60 | Fill #0

## 2019-12-14 MED FILL — ATORVASTATIN 80 MG TABLET: 80 | 30 days supply | Qty: 30 | Fill #1

## 2019-12-14 MED FILL — MONTELUKAST SOD 10 MG TAB: 10 | 30 days supply | Qty: 30 | Fill #1

## 2019-12-14 MED FILL — AMLODIPINE BESYLATE 5 MG TA: 5 | 30 days supply | Qty: 30 | Fill #1

## 2019-12-17 MED FILL — $VENTOLIN HFA 18G INHALER: 108 (90 BAS | 25 days supply | Qty: 18 | Fill #0

## 2019-12-22 ENCOUNTER — Other Ambulatory Visit: Payer: Self-pay

## 2019-12-22 ENCOUNTER — Encounter (HOSPITAL_COMMUNITY): Payer: Self-pay | Admitting: Licensed Clinical Social Worker

## 2019-12-22 ENCOUNTER — Ambulatory Visit (INDEPENDENT_AMBULATORY_CARE_PROVIDER_SITE_OTHER): Payer: Self-pay | Admitting: Licensed Clinical Social Worker

## 2019-12-22 DIAGNOSIS — F32A Depression, unspecified: Secondary | ICD-10-CM

## 2019-12-22 DIAGNOSIS — F329 Major depressive disorder, single episode, unspecified: Secondary | ICD-10-CM

## 2019-12-22 DIAGNOSIS — F411 Generalized anxiety disorder: Secondary | ICD-10-CM
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
## 2019-12-24 ENCOUNTER — Other Ambulatory Visit: Payer: Self-pay | Admitting: Physician Assistant

## 2019-12-24 ENCOUNTER — Other Ambulatory Visit: Payer: Self-pay | Admitting: Internal Medicine

## 2019-12-24 DIAGNOSIS — E039 Hypothyroidism, unspecified: Secondary | ICD-10-CM

## 2019-12-24 DIAGNOSIS — I10 Essential (primary) hypertension: Secondary | ICD-10-CM

## 2019-12-27 MED FILL — !BREO ELLIPTA 200-25 MCG: 200-25 | 30 days supply | Qty: 60 | Fill #1

## 2019-12-27 MED FILL — ESCITALOPRAM 10 MG TABLET: 10 | 10 days supply | Qty: 30 | Fill #1

## 2019-12-28 ENCOUNTER — Telehealth: Payer: Self-pay | Admitting: Internal Medicine

## 2019-12-28 MED ORDER — PREDNISONE 10 MG PO TABS
ORAL_TABLET | ORAL | 0 refills | Status: DC
Start: 2019-12-28 — End: 2020-01-20

## 2019-12-28 MED FILL — $BYSTOLIC 10 MG TABLET: 10 | 90 days supply | Qty: 90 | Fill #1

## 2019-12-28 MED FILL — predniSONE 10 MG TABS: 10 | 5 days supply | Qty: 20 | Fill #0

## 2019-12-28 NOTE — Telephone Encounter (Signed)
I am sending a refill on prednisone and will see her on June 30

## 2019-12-28 NOTE — Telephone Encounter (Signed)
Called pt made aware °

## 2019-12-28 NOTE — Telephone Encounter (Signed)
Patient called in and requested for a refill on listed medications. Patient stated that her copd had flared.   predniSONE (DELTASONE) 10 MG tablet [[id number]] and also an antibiotic.  [place], [address]  [place], [place] [address]

## 2020-01-05 ENCOUNTER — Encounter (HOSPITAL_COMMUNITY): Payer: Self-pay | Admitting: Licensed Clinical Social Worker

## 2020-01-05 ENCOUNTER — Other Ambulatory Visit: Payer: Self-pay

## 2020-01-05 ENCOUNTER — Ambulatory Visit (INDEPENDENT_AMBULATORY_CARE_PROVIDER_SITE_OTHER): Payer: Self-pay | Admitting: Licensed Clinical Social Worker

## 2020-01-05 DIAGNOSIS — F32A Depression, unspecified: Secondary | ICD-10-CM

## 2020-01-05 DIAGNOSIS — F411 Generalized anxiety disorder: Secondary | ICD-10-CM

## 2020-01-05 DIAGNOSIS — F329 Major depressive disorder, single episode, unspecified: Secondary | ICD-10-CM

## 2020-01-05 NOTE — Progress Notes (Signed)
Virtual Visit via Phone Note  I connected with [name] [name] on 01/05/20 at 12:00 pm EDT by phone enabled telemedicine application and verified that I am speaking with the correct person using two identifiers. Pt does not have a computer or internet.   I discussed the limitations of evaluation and management by telemedicine and the availability of in person appointments. The patient expressed understanding and agreed to proceed.  LOCATION: Patient: Home Provider: Home  History of Present Illness: Pt was referred to OP therapy by Psychiatrist, Dr. Casimiro Needle, for her anxiety, depression and Bipolar disorder.     Observations/Objective:  Pt presents anxious for her counseling session by phone-enabled telemedicine. Pt discussed her psychiatric symptoms and current life events. Patient reports an increase in her anxiety symptoms to the point she had a panic attack last week. "I thought I was having a heart attack." Used Socratic questions identifying what her current stressors are. 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 "I have to talk to my patient because I can't keep having so much stress." Role-played with patient how to talk to her patient, asking for what she needs. Cln provided psychoeducation on coping skills for her anxiety.  PLAN: anxiety, grief  Assessment and Plan: Counselor will continue to meet with patient to address treatment plan goals. Patient will continue to follow recommendations of providers and implement skills learned in session.    Follow Up Instructions:  Counselor discussed the assessment and treatment plan with the patient. The patient was provided an opportunity to ask questions and all were answered. The patient agreed with the plan and demonstrated an understanding of the instructions.   The patient was advised to call back or seek an in-person evaluation if the symptoms worsen or if the condition fails  to improve as anticipated.  I provided  45 minutes of non-face-to-face time during this encounter.   Myles Tavella S, LCAS

## 2020-01-12 ENCOUNTER — Other Ambulatory Visit: Payer: Self-pay

## 2020-01-12 ENCOUNTER — Ambulatory Visit: Payer: Self-pay | Attending: Critical Care Medicine | Admitting: Critical Care Medicine

## 2020-01-12 NOTE — Progress Notes (Deleted)
Subjective:    Patient ID: [name] [name], female    DOB: [birth year], 64 y.o.   MRN: [id number]  64 y.o.F with COPD.  Here for ED f/u.   COPD dx x 18yr.  Has seen Dr YAnnamaria Bootsin the past.   Smoking < 1PPD.  Notes every three weeks with bronchitis.  On neb meds 2xperday Pt with nausea and emesis x one week.  No blood.   Has mold in apt.  No HVAC filter change WAllegra Grana  11/18/2019 Pt with Copd sent for referral by PCP.  Last seen by Dr WJoya Gaskins2018 This patient has a longstanding history of COPD with overlap asthma.  Pulmonary functions in 2011 showed FEV1 of 47% predicted FVC 91% predicted residual volume 145% predicted and 67% predicted diffusion capacity Note the patient still lives in a home that has issues with mold.  And the patient is still smoking about a pack a day of cigarettes as well.  Patient also follows with behavioral therapist for severe anxiety syndrome and is on Lexapro 20 mg daily.  She also is using a nonselective beta-blocker labetalol for blood pressure control.  She is on Breo and Incruse but inhales very quickly on these medications.  She was diagnosed with pneumonia in March 2020.  Patient's not on oxygen therapy.  Shortness of Breath This is a chronic problem. Associated symptoms include abdominal pain, headaches, rhinorrhea, a sore throat, sputum production, vomiting and wheezing. Pertinent negatives include no chest pain, ear pain, fever, hemoptysis, leg pain, leg swelling, orthopnea, PND, swollen glands or syncope. The symptoms are aggravated by weather changes, URIs, any activity, exercise, pollens, fumes, odors and lying flat. Associated symptoms comments: Mucus is yellow.  Notes heartburn severe Nausea in AM. Risk factors include smoking. She has tried beta agonist inhalers and oral steroids for the symptoms. The treatment provided moderate relief. Her past medical history is significant for allergies, asthma, CAD, COPD and pneumonia. There is no history of PE.    Past Medical History:  Diagnosis Date  . Anxiety    takes Lexapro daily  . Arthritis    "back, from neck down pass my bra area" (03/25/2017)  . Asthma   . Bartholin gland cyst 08/29/2011  . Bruises easily    pt is on Effient  . Chronic back pain    herniated nucleus pulposus  . Chronic back pain    "neck to bra area; lower back" (03/25/2017)  . COPD [place]ch[place] disease) ([place]    early stages  . Coronary artery disease   . Depression    takes Klonopin daily  . Diabetes mellitus without complication ([place]   . Diverticulosis   . GERD (gastroesophageal reflux disease)    takes Nexium daily  . H/O hiatal hernia   . Heart attack ([place] 2011  . Hemorrhoids   . Hernia   . Hyperlipidemia    takes Lipitor daily  . Hypertension    takes Losartan daily and Labetalol bid  . Hypothyroidism    takes Synthroid daily  . Insomnia    hydroxyzine prn  . Joint pain   . Pneumonia    "couple times" (03/25/2017)  . Pre-diabetes    "just found out 1 wk ago" (03/25/2017)  . Psoriasis    elbows,knees,back  . Shortness of breath    with exertion  . Slowing of urinary stream   . Stress incontinence      Family History  Problem Relation Age of Onset  .  Heart disease Mother   . Hypertension Mother   . Stroke Mother   . Mental illness Mother   . Heart disease Father   . Hypertension Father   . Diabetes Father   . Cancer Maternal Aunt        breast  . Thyroid disease Paternal Aunt   . Heart disease Maternal Grandfather   . Anesthesia problems Daughter   . Cancer Paternal Grandmother        mouth  . Hypotension Neg Hx   . Malignant hyperthermia Neg Hx   . Pseudochol deficiency Neg Hx   . Colon cancer Neg Hx      Social History   Socioeconomic History  . Marital status: Divorced    Spouse name: Not on file  . Number of children: Not on file  . Years of education: Not on file  . Highest education level: Not on file  Occupational History  . Not on file   Tobacco Use  . Smoking status: Current Every Day Smoker    Packs/day: 1.00    Years: 50.00    Pack years: 50.00    Types: Cigarettes  . Smokeless tobacco: Never Used  Vaping Use  . Vaping Use: Never used  Substance and Sexual Activity  . Alcohol use: Yes    Comment: once a week  . Drug use: No    Types: Cocaine    Comment: has been years per pt  . Sexual activity: Not Currently    Birth control/protection: Surgical  Other Topics Concern  . Not on file  Social History Narrative  . Not on file   Social Determinants of Health   Financial Resource Strain:   . Difficulty of Paying Living Expenses:   Food Insecurity:   . Worried About Charity fundraiser in the Last Year:   . Arboriculturist in the Last Year:   Transportation Needs:   . Film/video editor (Medical):   Marland Kitchen Lack of Transportation (Non-Medical):   Physical Activity:   . Days of Exercise per Week:   . Minutes of Exercise per Session:   Stress:   . Feeling of Stress :   Social Connections:   . Frequency of Communication with Friends and Family:   . Frequency of Social Gatherings with Friends and Family:   . Attends Religious Services:   . Active Member of Clubs or Organizations:   . Attends Archivist Meetings:   Marland Kitchen Marital Status:   Intimate Partner Violence:   . Fear of Current or Ex-Partner:   . Emotionally Abused:   Marland Kitchen Physically Abused:   . Sexually Abused:      Allergies  Allergen Reactions  . Avocado Anaphylaxis  . Latex Shortness Of Breath and Rash  . Codeine Nausea Only     Outpatient Medications Prior to Visit  Medication Sig Dispense Refill  . albuterol (VENTOLIN HFA) 108 (90 Base) MCG/ACT inhaler INHALE 2 PUFFS INTO THE LUNGS EVERY 6 (SIX) HOURS AS NEEDED FOR WHEEZING OR SHORTNESS OF BREATH. 18 g 2  . amLODipine (NORVASC) 5 MG tablet Take 1 tablet (5 mg total) by mouth daily. 90 tablet 3  . aspirin EC 81 MG tablet Take 81 mg by mouth daily.    Marland Kitchen atorvastatin (LIPITOR) 80 MG  tablet Take 1 tablet (80 mg total) by mouth daily. 90 tablet 3  . Blood Glucose Monitoring Suppl (TRUE METRIX METER) DEVI 1 kit by Does not apply route 4 (four) times daily.  1 Device 0  . busPIRone (BUSPAR) 15 MG tablet 2  qam   1  q noon  And 1 qhs 120 tablet 3  . cetirizine (ZYRTEC) 10 MG tablet Take 10 mg by mouth daily.    . Cholecalciferol (VITAMIN D) 50 MCG (2000 UT) CAPS Take 2,000 Units by mouth daily.    . cyclobenzaprine (FLEXERIL) 10 MG tablet TAKE 1 TABLET BY MOUTH 3 TIMES DAILY AS NEEDED. 30 tablet 1  . Dextromethorphan-guaiFENesin (WAL-TUSSIN DM PO) Take 5 mLs by mouth 3 (three) times daily as needed (cough).    . diphenhydramine-acetaminophen (TYLENOL PM) 25-500 MG TABS tablet Take 1 tablet by mouth at bedtime as needed (sleep).    Marland Kitchen doxepin (SINEQUAN) 50 MG capsule TAKE 1 CAPSULE (50 MG TOTAL) BY MOUTH AT BEDTIME. 30 capsule 2  . escitalopram (LEXAPRO) 10 MG tablet Take 3 tablets (30 mg total) by mouth daily. 30 tablet 3  . fluticasone (FLONASE) 50 MCG/ACT nasal spray Place 2 sprays into both nostrils daily. (Patient taking differently: Place 2 sprays into both nostrils at bedtime. ) 16 g 6  . fluticasone furoate-vilanterol (BREO ELLIPTA) 200-25 MCG/INH AEPB Inhale 1 puff into the lungs daily. 60 each 6  . furosemide (LASIX) 20 MG tablet TAKE 1 TABLET (20 MG TOTAL) BY MOUTH DAILY. 30 tablet 2  . gabapentin (NEURONTIN) 600 MG tablet TAKE 1 TABLET BY MOUTH 4 TIMES DAILY (Patient taking differently: Take 600 mg by mouth as needed. ) 120 tablet 3  . glucose blood (TRUE METRIX BLOOD GLUCOSE TEST) test strip Use as instructed 100 each 12  . ipratropium-albuterol (DUONEB) 0.5-2.5 (3) MG/3ML SOLN TAKE 3 MLS VIA NEBULIZATION EVERY 6 HOURS 360 mL 1  . levothyroxine (SYNTHROID) 100 MCG tablet TAKE 1 TABLET (100 MCG TOTAL) BY MOUTH DAILY. 30 tablet 2  . losartan (COZAAR) 50 MG tablet TAKE 1.5 TABLETS (75 MG TOTAL) BY MOUTH DAILY. NEEDS APPOINTMENT WITH PCP. 45 tablet 3  . metFORMIN (GLUCOPHAGE)  500 MG tablet Take 1 tablet (500 mg total) by mouth 2 (two) times daily with a meal. 180 tablet 3  . mirtazapine (REMERON) 15 MG tablet Take 2 tablets (30 mg total) by mouth at bedtime. 90 tablet 1  . montelukast (SINGULAIR) 10 MG tablet TAKE 1 TABLET BY MOUTH AT BEDTIME. 30 tablet 2  . nebivolol (BYSTOLIC) 10 MG tablet Take 1 tablet (10 mg total) by mouth daily. 30 tablet 4  . nicotine (NICODERM CQ - DOSED IN MG/24 HOURS) 21 mg/24hr patch Place 1 patch (21 mg total) onto the skin daily. (Patient not taking: Reported on 11/08/2019) 28 patch 1  . nitroGLYCERIN (NITROSTAT) 0.4 MG SL tablet Place 1 tablet (0.4 mg total) under the tongue every 5 (five) minutes as needed for chest pain. 90 tablet 3  . omeprazole (PRILOSEC) 40 MG capsule Take 1 capsule (40 mg total) by mouth 2 (two) times daily before a meal. 60 capsule 1  . ondansetron (ZOFRAN) 4 MG tablet Take 1 tablet (4 mg total) by mouth every 8 (eight) hours as needed for nausea or vomiting. (Patient not taking: Reported on 11/18/2019) 20 tablet 0  . predniSONE (DELTASONE) 10 MG tablet Take 4 tablets daily for 5 days then stop 20 tablet 0  . ticagrelor (BRILINTA) 90 MG TABS tablet TAKE 1 TABLET BY MOUTH 2 (TWO) TIMES DAILY. (Patient taking differently: Take 90 mg by mouth 2 (two) times daily. ) 60 tablet 6  . triamcinolone cream (KENALOG) 0.1 % APPLY 1 APPLICATION TOPICALLY 2  TIMES DAILY. 30 g 1  . TRUEPLUS LANCETS 28G MISC 28 g by Does not apply route QID. 120 each 2  . umeclidinium bromide (INCRUSE ELLIPTA) 62.5 MCG/INH AEPB Inhale 1 puff into the lungs daily. 1 each 6  . Vitamin E 180 MG CAPS Take 180 mg by mouth daily.     No facility-administered medications prior to visit.     Review of Systems  Constitutional: Negative for fever.  HENT: Positive for rhinorrhea and sore throat. Negative for ear pain.   Respiratory: Positive for sputum production, shortness of breath and wheezing. Negative for hemoptysis.   Cardiovascular: Negative for chest  pain, orthopnea, leg swelling, syncope and PND.  Gastrointestinal: Positive for abdominal pain and vomiting.  Neurological: Positive for headaches.       Objective:   Physical Exam  There were no vitals filed for this visit.  UIQ:NVVYXAJ WF , in no distress, depressed affect  ENT: No lesions,  mouth clear,  oropharynx clear, no postnasal drip  Neck: No JVD, no TMG, no carotid bruits  Lungs: No use of accessory muscles, no dullness to percussion, expiratory wheezes, poor airflow  Cardiovascular: RRR, heart sounds normal, no murmur or gallops, no peripheral edema  Abdomen: soft   Tender in epigastric area, no rebound or guarding,  no HSM,  BS normal  Musculoskeletal: No deformities, no cyanosis or clubbing  Neuro: alert, non focal  Skin: Warm, no lesions or rashes  No results found. PFTs 2011: FeV1 47%  FVC 90%  DLCO 67% GOLD C  9/11 CXR reviewed .  NAD.  No lung mass, copd changes 9/13 BMET Cr 0.9  K 4.3  Hgb 12.9  WBC 14.9     Assessment & Plan:  I personally reviewed all images and lab data in the [facility] system as well as any outside material available during this office visit and agree with the  radiology impressions.   No problem-specific Assessment & Plan notes found for this encounter.   There are no diagnoses linked to this encounter.  I had an extended discussion with the patient and or family lasting 10 minutes of a 25 minute visit including: smoking cessation, reviewed all meds,

## 2020-01-18 MED FILL — ESCITALOPRAM 10 MG TABLET: 10 | 10 days supply | Qty: 30 | Fill #2

## 2020-01-18 MED FILL — MONTELUKAST SOD 10 MG TAB: 10 | 30 days supply | Qty: 30 | Fill #2

## 2020-01-18 MED FILL — ATORVASTATIN 80 MG TABLET: 80 | 30 days supply | Qty: 30 | Fill #2

## 2020-01-18 MED FILL — GABAPENTIN 600 MG TABLET: 600 | 30 days supply | Qty: 120 | Fill #3

## 2020-01-19 ENCOUNTER — Ambulatory Visit (INDEPENDENT_AMBULATORY_CARE_PROVIDER_SITE_OTHER): Payer: Self-pay | Admitting: Licensed Clinical Social Worker

## 2020-01-19 ENCOUNTER — Other Ambulatory Visit (HOSPITAL_COMMUNITY): Payer: Self-pay | Admitting: Psychiatry

## 2020-01-19 ENCOUNTER — Other Ambulatory Visit: Payer: Self-pay

## 2020-01-19 ENCOUNTER — Encounter (HOSPITAL_COMMUNITY): Payer: Self-pay | Admitting: Licensed Clinical Social Worker

## 2020-01-19 ENCOUNTER — Other Ambulatory Visit: Payer: Self-pay | Admitting: Internal Medicine

## 2020-01-19 DIAGNOSIS — G4700 Insomnia, unspecified: Secondary | ICD-10-CM

## 2020-01-19 DIAGNOSIS — F329 Major depressive disorder, single episode, unspecified: Secondary | ICD-10-CM

## 2020-01-19 DIAGNOSIS — F411 Generalized anxiety disorder: Secondary | ICD-10-CM

## 2020-01-19 DIAGNOSIS — I10 Essential (primary) hypertension: Secondary | ICD-10-CM

## 2020-01-19 DIAGNOSIS — F32A Depression, unspecified: Secondary | ICD-10-CM

## 2020-01-19 MED FILL — FUROSEMIDE 20 MG TABS: 20 | 30 days supply | Qty: 30 | Fill #1

## 2020-01-19 MED FILL — OMEPRAZOLE DR 40 MG CAPSULE: 40 | 30 days supply | Qty: 60 | Fill #1

## 2020-01-19 MED FILL — $INCRUSE ELLIPTA 62.5 MCG I: 62.5 MCG | 90 days supply | Qty: 90 | Fill #2

## 2020-01-19 MED FILL — LEVOTHYROXINE SODIUM 100 MC: 100 | 30 days supply | Qty: 30 | Fill #1

## 2020-01-19 MED FILL — $VENTOLIN HFA 18G INHALER: 108 (90 BAS | 25 days supply | Qty: 18 | Fill #1

## 2020-01-19 NOTE — Progress Notes (Signed)
Virtual Visit via Phone Note  I connected with [name] [name] on 01/19/20 at 5:00 pm EDT by phone enabled telemedicine application and verified that I am speaking with the correct person using two identifiers. Pt does not have a computer or internet.   I discussed the limitations of evaluation and management by telemedicine and the availability of in person appointments. The patient expressed understanding and agreed to proceed.  LOCATION: Patient: Home Provider: Home  History of Present Illness: Pt was referred to OP therapy by Psychiatrist, Dr. Casimiro Needle, for her anxiety, depression and Bipolar disorder.     Observations/Objective:  Pt presents angry and tearful for her counseling session by phone-enabled telemedicine. Pt discussed her psychiatric symptoms and current life events. Patient reports she and her daughter just had a fight. Patient was crying telling me about the argument and how it's affecting her mental health."She wants me to be perfect." Provided a platform for patient to talk about her feelings about her daughter. "She is mean to me and hurts my feelings." Role played communication with daughter using I-statements.    PLAN: anxiety, grief  Assessment and Plan: Counselor will continue to meet with patient to address treatment plan goals. Patient will continue to follow recommendations of providers and implement skills learned in session.    Follow Up Instructions:  Counselor discussed the assessment and treatment plan with the patient. The patient was provided an opportunity to ask questions and all were answered. The patient agreed with the plan and demonstrated an understanding of the instructions.   The patient was advised to call back or seek an in-person evaluation if the symptoms worsen or if the condition fails to improve as anticipated.  I provided  25 minutes of non-face-to-face time during this encounter.   Alam Guterrez S, LCAS

## 2020-01-19 NOTE — Telephone Encounter (Signed)
Requested medication (s) are due for refill today:yes  Requested medication (s) are on the active medication list: yes  Last refill: 11/18/19   #30  1 refill  Future visit scheduled yes  02/07/2020  Notes to clinic:not delegated  Requested Prescriptions  Pending Prescriptions Disp Refills   cyclobenzaprine (FLEXERIL) 10 MG tablet [Pharmacy Med Name: CYCLOBENZAPRINE 10 MG TAB 10 Tablet] 30 tablet 1    Sig: TAKE 1 TABLET BY MOUTH 3 TIMES DAILY AS NEEDED.      Not Delegated - Analgesics:  Muscle Relaxants Failed - 01/19/2020  4:46 PM      Failed - This refill cannot be delegated      Passed - Valid encounter within last 6 months    Recent Outpatient Visits           2 months ago COPD exacerbation [facility])   [place] Elsie Stain, MD   4 months ago COPD with chronic bronchitis [facility])   [place] Ladell Pier, MD   5 months ago COPD with acute exacerbation [facility])   [place] Antony Blackbird, MD   6 months ago COPD exacerbation [facility])   [place] Ladell Pier, MD   8 months ago Suspected COVID-19 virus infection   [place], Dionne Bucy, [place]       Future Appointments             In 2 weeks Joya Gaskins Burnett Harry, MD [place]   In 3 weeks Troy Sine, MD [facility], [facility]             Signed Prescriptions Disp Refills   losartan (COZAAR) 50 MG tablet 45 tablet 2    Sig: TAKE 1.5 TABLETS (75 MG TOTAL) BY MOUTH DAILY. NEEDS APPOINTMENT WITH PCP.      Cardiovascular:  Angiotensin Receptor Blockers Failed - 01/19/2020  4:46 PM      Failed - Cr in normal range and within 180 days    Creat  Date Value Ref Range Status  04/16/2016 0.93 0.50 - 1.05 mg/dL Final    Comment:      For patients > or = 64 years of age: The upper reference limit for Creatinine is  approximately 13% higher for people identified as African-American.      Creatinine, Ser  Date Value Ref Range Status  04/05/2019 0.92 0.57 - 1.00 mg/dL Final   Creatinine,U  Date Value Ref Range Status  01/11/2010 61.7 mg/dL Final    Comment:    See lab report for associated comment(s)          Failed - K in normal range and within 180 days    Potassium  Date Value Ref Range Status  04/05/2019 5.0 3.5 - 5.2 mmol/L Final          Failed - Last BP in normal range    BP Readings from Last 1 Encounters:  11/18/19 (!) 149/77          Passed - Patient is not pregnant      Passed - Valid encounter within last 6 months    Recent Outpatient Visits           2 months ago COPD exacerbation ([place])   [place] Elsie Stain, MD   4 months ago COPD with chronic bronchitis (  [place])   [place] Ladell Pier, MD   5 months ago COPD with acute exacerbation [facility])   [place], MD   6 months ago COPD exacerbation [facility])   [place], MD   8 months ago Suspected COVID-19 virus infection   [place], Dionne Bucy, [place]       Future Appointments             In 2 weeks Joya Gaskins Burnett Harry, MD [place]   In 3 weeks Troy Sine, MD [facility] [place], [facility]

## 2020-01-20 ENCOUNTER — Encounter (INDEPENDENT_AMBULATORY_CARE_PROVIDER_SITE_OTHER): Payer: Self-pay | Admitting: Primary Care

## 2020-01-20 ENCOUNTER — Telehealth (INDEPENDENT_AMBULATORY_CARE_PROVIDER_SITE_OTHER): Payer: Medicaid Other | Admitting: Primary Care

## 2020-01-20 ENCOUNTER — Other Ambulatory Visit: Payer: Self-pay

## 2020-01-20 DIAGNOSIS — J441 Chronic obstructive pulmonary disease with (acute) exacerbation: Secondary | ICD-10-CM

## 2020-01-20 DIAGNOSIS — F17200 Nicotine dependence, unspecified, uncomplicated: Secondary | ICD-10-CM

## 2020-01-20 MED FILL — LOSARTAN POTASSIUM 50 MG TA: 50 | 30 days supply | Qty: 45 | Fill #0

## 2020-01-20 MED FILL — CYCLOBENZAPRINE 10 MG TAB: 10 | 10 days supply | Qty: 30 | Fill #0

## 2020-01-20 NOTE — Progress Notes (Signed)
Virtual Visit via Telephone Note  I connected with [name] [name] on 01/20/20 at  2:50 PM EDT by telephone and verified that I am speaking with the correct person using two identifiers.   I discussed the limitations, risks, security and privacy concerns of performing an evaluation and management service by telephone and the availability of in person appointments. I also discussed with the patient that there may be a patient responsible charge related to this service. The patient expressed understanding and agreed to proceed.   History of Present Illness: [name] [name] is having a tele visit for COPD exacerbation.    Past Medical History:  Diagnosis Date  . Anxiety    takes Lexapro daily  . Arthritis    "back, from neck down pass my bra area" (03/25/2017)  . Asthma   . Bartholin gland cyst 08/29/2011  . Bruises easily    pt is on Effient  . Chronic back pain    herniated nucleus pulposus  . Chronic back pain    "neck to bra area; lower back" (03/25/2017)  . COPD (chronic obstructive pulmonary disease) ([place])    early stages  . Coronary artery disease   . Depression    takes Klonopin daily  . Diabetes mellitus without complication ([place])   . Diverticulosis   . GERD (gastroesophageal reflux disease)    takes Nexium daily  . H/O hiatal hernia   . Heart attack ([place]) 2011  . Hemorrhoids   . Hernia   . Hyperlipidemia    takes Lipitor daily  . Hypertension    takes Losartan daily and Labetalol bid  . Hypothyroidism    takes Synthroid daily  . Insomnia    hydroxyzine prn  . Joint pain   . Pneumonia    "couple times" (03/25/2017)  . Pre-diabetes    "just found out 1 wk ago" (03/25/2017)  . Psoriasis    elbows,knees,back  . Shortness of breath    with exertion  . Slowing of urinary stream   . Stress incontinence    Current Outpatient Medications on File Prior to Visit  Medication Sig Dispense Refill  . albuterol (VENTOLIN HFA) 108 (90 Base) MCG/ACT inhaler INHALE 2 PUFFS  INTO THE LUNGS EVERY 6 (SIX) HOURS AS NEEDED FOR WHEEZING OR SHORTNESS OF BREATH. 18 g 2  . amLODipine (NORVASC) 5 MG tablet Take 1 tablet (5 mg total) by mouth daily. 90 tablet 3  . aspirin EC 81 MG tablet Take 81 mg by mouth daily.    Marland Kitchen atorvastatin (LIPITOR) 80 MG tablet Take 1 tablet (80 mg total) by mouth daily. 90 tablet 3  . Blood Glucose Monitoring Suppl (TRUE METRIX METER) DEVI 1 kit by Does not apply route 4 (four) times daily. 1 Device 0  . busPIRone (BUSPAR) 15 MG tablet 2  qam   1  q noon  And 1 qhs 120 tablet 3  . cetirizine (ZYRTEC) 10 MG tablet Take 10 mg by mouth daily.    . Cholecalciferol (VITAMIN D) 50 MCG (2000 UT) CAPS Take 2,000 Units by mouth daily.    . cyclobenzaprine (FLEXERIL) 10 MG tablet TAKE 1 TABLET BY MOUTH 3 TIMES DAILY AS NEEDED. 30 tablet 1  . diphenhydramine-acetaminophen (TYLENOL PM) 25-500 MG TABS tablet Take 1 tablet by mouth at bedtime as needed (sleep).    Marland Kitchen doxepin (SINEQUAN) 50 MG capsule TAKE 1 CAPSULE (50 MG TOTAL) BY MOUTH AT BEDTIME. 30 capsule 2  . escitalopram (LEXAPRO) 10 MG tablet Take 3  tablets (30 mg total) by mouth daily. 30 tablet 3  . fluticasone furoate-vilanterol (BREO ELLIPTA) 200-25 MCG/INH AEPB Inhale 1 puff into the lungs daily. 60 each 6  . furosemide (LASIX) 20 MG tablet TAKE 1 TABLET (20 MG TOTAL) BY MOUTH DAILY. 30 tablet 2  . gabapentin (NEURONTIN) 600 MG tablet TAKE 1 TABLET BY MOUTH 4 TIMES DAILY (Patient taking differently: Take 600 mg by mouth as needed. ) 120 tablet 3  . glucose blood (TRUE METRIX BLOOD GLUCOSE TEST) test strip Use as instructed 100 each 12  . ipratropium-albuterol (DUONEB) 0.5-2.5 (3) MG/3ML SOLN TAKE 3 MLS VIA NEBULIZATION EVERY 6 HOURS 360 mL 1  . levothyroxine (SYNTHROID) 100 MCG tablet TAKE 1 TABLET (100 MCG TOTAL) BY MOUTH DAILY. 30 tablet 2  . losartan (COZAAR) 50 MG tablet TAKE 1.5 TABLETS (75 MG TOTAL) BY MOUTH DAILY. NEEDS APPOINTMENT WITH PCP. 45 tablet 2  . metFORMIN (GLUCOPHAGE) 500 MG tablet  Take 1 tablet (500 mg total) by mouth 2 (two) times daily with a meal. 180 tablet 3  . mirtazapine (REMERON) 15 MG tablet Take 2 tablets (30 mg total) by mouth at bedtime. 90 tablet 1  . montelukast (SINGULAIR) 10 MG tablet TAKE 1 TABLET BY MOUTH AT BEDTIME. 30 tablet 2  . nebivolol (BYSTOLIC) 10 MG tablet Take 1 tablet (10 mg total) by mouth daily. 30 tablet 4  . nitroGLYCERIN (NITROSTAT) 0.4 MG SL tablet Place 1 tablet (0.4 mg total) under the tongue every 5 (five) minutes as needed for chest pain. 90 tablet 3  . omeprazole (PRILOSEC) 40 MG capsule Take 1 capsule (40 mg total) by mouth 2 (two) times daily before a meal. 60 capsule 1  . ticagrelor (BRILINTA) 90 MG TABS tablet TAKE 1 TABLET BY MOUTH 2 (TWO) TIMES DAILY. (Patient taking differently: Take 90 mg by mouth 2 (two) times daily. ) 60 tablet 6  . triamcinolone cream (KENALOG) 0.1 % APPLY 1 APPLICATION TOPICALLY 2 TIMES DAILY. 30 g 1  . TRUEPLUS LANCETS 28G MISC 28 g by Does not apply route QID. 120 each 2  . umeclidinium bromide (INCRUSE ELLIPTA) 62.5 MCG/INH AEPB Inhale 1 puff into the lungs daily. 1 each 6  . Vitamin E 180 MG CAPS Take 180 mg by mouth daily.     No current facility-administered medications on file prior to visit.   Observations/Objective: Review of Systems  Respiratory: Positive for cough, sputum production, shortness of breath and wheezing.   All other systems reviewed and are negative.  Assessment and Plan: Matricia was seen today for uri.  Diagnoses and all orders for this visit:  COPD exacerbation ([place]) -     dextromethorphan-guaiFENesin (WAL-TUSSIN DM) 10-100 MG/5ML liquid; Take 10 mLs by mouth 3 (three) times daily as needed (cough). -     predniSONE (DELTASONE) 10 MG tablet; Take 4 tablets (40 mg total) by mouth daily with breakfast.   TOBACCO ABUSE Discussed COPD exacerbation is also contributed to on going tobacco abuse. She is very much aware of nicotine affect every organ in the body second leading  cause of death.  Increased risk for lung cancer and other respiratory diseases recommend cessation.  This will be reminded at each clinical visit.    Follow Up Instructions:    I discussed the assessment and treatment plan with the patient. The patient was provided an opportunity to ask questions and all were answered. The patient agreed with the plan and demonstrated an understanding of the instructions.   The patient  was advised to call back or seek an in-person evaluation if the symptoms worsen or if the condition fails to improve as anticipated.  I provided 10 minutes of non-face-to-face time during this encounter.   Kerin Perna, NP

## 2020-01-20 NOTE — Progress Notes (Signed)
Pt is having cold like symptoms  She has nasal congestion and coughing( productive)

## 2020-01-21 ENCOUNTER — Other Ambulatory Visit: Payer: Self-pay | Admitting: Critical Care Medicine

## 2020-01-21 NOTE — Telephone Encounter (Signed)
Requested medication (s) are due for refill today: no  Requested medication (s) are on the active medication list: yes  Last refill:  Prescription discontinued on 01/20/20  Future visit scheduled: yes  Notes to clinic:  Please review for refill. Refill not delegated per protocol.     Requested Prescriptions  Pending Prescriptions Disp Refills   predniSONE (DELTASONE) 10 MG tablet [Pharmacy Med Name: predniSONE 10 MG TABS 10 Tablet] 20 tablet 0    Sig: Take 4 tablets daily for 5 days then stop      Not Delegated - Endocrinology:  Oral Corticosteroids Failed - 01/21/2020  4:21 PM      Failed - This refill cannot be delegated      Failed - Last BP in normal range    BP Readings from Last 1 Encounters:  11/18/19 (!) 149/77          Passed - Valid encounter within last 6 months    Recent Outpatient Visits           Yesterday COPD exacerbation ([place])   [place] [facility] Kerin Perna, NP   2 months ago COPD exacerbation [facility])   [place] Elsie Stain, MD   4 months ago COPD with chronic bronchitis [facility])   [place] Ladell Pier, MD   5 months ago COPD with acute exacerbation [facility])   [place], MD   6 months ago COPD exacerbation [facility])   [place], Deborah B, MD       Future Appointments             In 2 weeks Joya Gaskins Burnett Harry, MD [place]   In 2 weeks Troy Sine, MD [facility] [place], [facility]

## 2020-01-22 MED ORDER — PREDNISONE 10 MG PO TABS: 40.0000 mg | ORAL_TABLET | Freq: Every day | ORAL | 0 refills | Status: DC

## 2020-01-22 MED ORDER — DEXTROMETHORPHAN-GUAIFENESIN 10-100 MG/5ML PO LIQD: 10.0000 mL | Freq: Three times a day (TID) | ORAL | 0 refills | Status: DC | PRN

## 2020-01-24 ENCOUNTER — Ambulatory Visit (INDEPENDENT_AMBULATORY_CARE_PROVIDER_SITE_OTHER): Payer: Self-pay | Admitting: Licensed Clinical Social Worker

## 2020-01-24 ENCOUNTER — Other Ambulatory Visit: Payer: Self-pay | Admitting: Critical Care Medicine

## 2020-01-24 ENCOUNTER — Encounter (HOSPITAL_COMMUNITY): Payer: Self-pay | Admitting: Licensed Clinical Social Worker

## 2020-01-24 ENCOUNTER — Other Ambulatory Visit: Payer: Self-pay

## 2020-01-24 DIAGNOSIS — J441 Chronic obstructive pulmonary disease with (acute) exacerbation: Secondary | ICD-10-CM

## 2020-01-24 DIAGNOSIS — F329 Major depressive disorder, single episode, unspecified: Secondary | ICD-10-CM

## 2020-01-24 DIAGNOSIS — F32A Depression, unspecified: Secondary | ICD-10-CM

## 2020-01-24 DIAGNOSIS — F411 Generalized anxiety disorder: Secondary | ICD-10-CM

## 2020-01-24 MED FILL — predniSONE 10 MG TABS: 10 | 5 days supply | Qty: 20 | Fill #0

## 2020-01-24 NOTE — Progress Notes (Signed)
Virtual Visit via Phone Note  I connected with [name] [name] on 01/24/20 at 10:00 am EDT by phone enabled telemedicine application and verified that I am speaking with the correct person using two identifiers. Pt does not have a computer or internet.   I discussed the limitations of evaluation and management by telemedicine and the availability of in person appointments. The patient expressed understanding and agreed to proceed.  LOCATION: Patient: Home Provider: Home  History of Present Illness: Pt was referred to OP therapy by Psychiatrist, Dr. Casimiro Needle, for her anxiety, depression and Bipolar disorder.     Observations/Objective:  Pt presents anxious and depressed for her counseling session by phone-enabled telemedicine. Pt discussed her psychiatric symptoms and current life events. Patient reports she and her daughter are back speaking again. Used socratic questions. Pt was tearful in describing her relationship with her daughter. Clinician utilized MI OARS to affirm concerns. Clinician processed options for communicating her concerns with her daughter.   PLAN: anxiety, grief  Assessment and Plan: Counselor will continue to meet with patient to address treatment plan goals. Patient will continue to follow recommendations of providers and implement skills learned in session.    Follow Up Instructions:  Counselor discussed the assessment and treatment plan with the patient. The patient was provided an opportunity to ask questions and all were answered. The patient agreed with the plan and demonstrated an understanding of the instructions.   The patient was advised to call back or seek an in-person evaluation if the symptoms worsen or if the condition fails to improve as anticipated.  I provided  40 minutes of non-face-to-face time during this encounter.   Dierks Wach S, LCAS

## 2020-01-27 MED FILL — busPIRone HCL 15 MG TABS: 15 | 30 days supply | Qty: 120 | Fill #1

## 2020-01-27 MED FILL — $BREO ELLIPTA 200-25 MCG IN: 200-25 | 30 days supply | Qty: 60 | Fill #2

## 2020-01-27 MED FILL — ESCITALOPRAM 10 MG TABLET: 10 | 10 days supply | Qty: 30 | Fill #3

## 2020-01-27 MED FILL — AMLODIPINE BESYLATE 5 MG TA: 5 | 30 days supply | Qty: 30 | Fill #2

## 2020-01-27 MED FILL — CYCLOBENZAPRINE 10 MG TAB: 10 | 10 days supply | Qty: 30 | Fill #1

## 2020-02-02 ENCOUNTER — Encounter (HOSPITAL_COMMUNITY): Payer: Self-pay | Admitting: Licensed Clinical Social Worker

## 2020-02-02 ENCOUNTER — Other Ambulatory Visit: Payer: Self-pay

## 2020-02-02 ENCOUNTER — Ambulatory Visit (INDEPENDENT_AMBULATORY_CARE_PROVIDER_SITE_OTHER): Payer: Self-pay | Admitting: Licensed Clinical Social Worker

## 2020-02-02 DIAGNOSIS — F32A Depression, unspecified: Secondary | ICD-10-CM

## 2020-02-02 DIAGNOSIS — F411 Generalized anxiety disorder: Secondary | ICD-10-CM

## 2020-02-02 DIAGNOSIS — F329 Major depressive disorder, single episode, unspecified: Secondary | ICD-10-CM

## 2020-02-02 NOTE — Progress Notes (Signed)
Virtual Visit via Phone Note  I connected with [name] [name] on 02/02/20 at 10:00 am EDT by phone enabled telemedicine application and verified that I am speaking with the correct person using two identifiers. Pt does not have a computer or internet.   I discussed the limitations of evaluation and management by telemedicine and the availability of in person appointments. The patient expressed understanding and agreed to proceed.  LOCATION: Patient: Home Provider: Home  History of Present Illness: Pt was referred to OP therapy by Psychiatrist, Dr. Casimiro Needle, for her anxiety, depression and Bipolar disorder.     Observations/Objective:  Pt presents anxious for her counseling session by phone-enabled telemedicine. Pt discussed her psychiatric symptoms and current life events. Patient reports she has been back to the doc for COPD exacerbation of symptoms. Used Socratic questions. Cln provided psychoeducation on tobacco. Patient is not willing to stop smoking at this time. Patient described her life currently which she describes as not fulfilling. Cln used CBT, teaching patient to eliminate her unhelpful or irrational thought patterns, which can greatly reduce her overall unhappiness, while also becoming more prone to focusing on the good in her life.      PLAN: anxiety, grief  Assessment and Plan: Counselor will continue to meet with patient to address treatment plan goals. Patient will continue to follow recommendations of providers and implement skills learned in session.    Follow Up Instructions:  Counselor discussed the assessment and treatment plan with the patient. The patient was provided an opportunity to ask questions and all were answered. The patient agreed with the plan and demonstrated an understanding of the instructions.   The patient was advised to call back or seek an in-person evaluation if the symptoms worsen or if the condition fails to improve as anticipated.  I provided   40 minutes of non-face-to-face time during this encounter.   Audrionna Lampton S, LCAS

## 2020-02-06 NOTE — Progress Notes (Deleted)
Subjective:    Patient ID: [name] [name], female    DOB: 22-Nov-1955, 64 y.o.   MRN: [id number]  64 y.o.F with COPD.  Here for ED f/u.   COPD dx x 74yr.  Has seen Dr YAnnamaria Bootsin the past.   Smoking < 1PPD.  Notes every three weeks with bronchitis.  On neb meds 2xperday Pt with nausea and emesis x one week.  No blood.   Has mold in apt.  No HVAC filter change WAllegra Grana  11/18/2019 Pt with Copd sent for referral by PCP.  Last seen by Dr WJoya Gaskins2018 This patient has a longstanding history of COPD with overlap asthma.  Pulmonary functions in 2011 showed FEV1 of 47% predicted FVC 91% predicted residual volume 145% predicted and 67% predicted diffusion capacity Note the patient still lives in a home that has issues with mold.  And the patient is still smoking about a pack a day of cigarettes as well.  Patient also follows with behavioral therapist for severe anxiety syndrome and is on Lexapro 20 mg daily.  She also is using a nonselective beta-blocker labetalol for blood pressure control.  She is on Breo and Incruse but inhales very quickly on these medications.  She was diagnosed with pneumonia in March 2020.  Patient's not on oxygen therapy.  02/07/2020   Shortness of Breath This is a chronic problem. Associated symptoms include abdominal pain, headaches, rhinorrhea, a sore throat, sputum production, vomiting and wheezing. Pertinent negatives include no chest pain, ear pain, fever, hemoptysis, leg pain, leg swelling, orthopnea, PND, swollen glands or syncope. The symptoms are aggravated by weather changes, URIs, any activity, exercise, pollens, fumes, odors and lying flat. Associated symptoms comments: Mucus is yellow.  Notes heartburn severe Nausea in AM. Risk factors include smoking. She has tried beta agonist inhalers and oral steroids for the symptoms. The treatment provided moderate relief. Her past medical history is significant for allergies, asthma, CAD, COPD and pneumonia. There is no  history of PE.   Past Medical History:  Diagnosis Date  . Anxiety    takes Lexapro daily  . Arthritis    "back, from neck down pass my bra area" (03/25/2017)  . Asthma   . Bartholin gland cyst 08/29/2011  . Bruises easily    pt is on Effient  . Chronic back pain    herniated nucleus pulposus  . Chronic back pain    "neck to bra area; lower back" (03/25/2017)  . COPD (chronic obstructive pulmonary disease) ([place]    early stages  . Coronary artery disease   . Depression    takes Klonopin daily  . Diabetes mellitus without complication ([place]   . Diverticulosis   . GERD (gastroesophageal reflux disease)    takes Nexium daily  . H/O hiatal hernia   . Heart attack ([place] 2011  . Hemorrhoids   . Hernia   . Hyperlipidemia    takes Lipitor daily  . Hypertension    takes Losartan daily and Labetalol bid  . Hypothyroidism    takes Synthroid daily  . Insomnia    hydroxyzine prn  . Joint pain   . Pneumonia    "couple times" (03/25/2017)  . Pre-diabetes    "just found out 1 wk ago" (03/25/2017)  . Psoriasis    elbows,knees,back  . Shortness of breath    with exertion  . Slowing of urinary stream   . Stress incontinence      Family History  Problem Relation Age  of Onset  . Heart disease Mother   . Hypertension Mother   . Stroke Mother   . Mental illness Mother   . Heart disease Father   . Hypertension Father   . Diabetes Father   . Cancer Maternal Aunt        breast  . Thyroid disease Paternal Aunt   . Heart disease Maternal Grandfather   . Anesthesia problems Daughter   . Cancer Paternal Grandmother        mouth  . Hypotension Neg Hx   . Malignant hyperthermia Neg Hx   . Pseudochol deficiency Neg Hx   . Colon cancer Neg Hx      Social History   Socioeconomic History  . Marital status: Divorced    Spouse name: Not on file  . Number of children: Not on file  . Years of education: Not on file  . Highest education level: Not on file  Occupational History  .  Not on file  Tobacco Use  . Smoking status: Current Every Day Smoker    Packs/day: 1.00    Years: 50.00    Pack years: 50.00    Types: Cigarettes  . Smokeless tobacco: Never Used  Vaping Use  . Vaping Use: Never used  Substance and Sexual Activity  . Alcohol use: Yes    Comment: once a week  . Drug use: No    Types: Cocaine    Comment: has been years per pt  . Sexual activity: Not Currently    Birth control/protection: Surgical  Other Topics Concern  . Not on file  Social History Narrative  . Not on file   Social Determinants of Health   Financial Resource Strain:   . Difficulty of Paying Living Expenses:   Food Insecurity:   . Worried About Charity fundraiser in the Last Year:   . Arboriculturist in the Last Year:   Transportation Needs:   . Film/video editor (Medical):   Marland Kitchen Lack of Transportation (Non-Medical):   Physical Activity:   . Days of Exercise per Week:   . Minutes of Exercise per Session:   Stress:   . Feeling of Stress :   Social Connections:   . Frequency of Communication with Friends and Family:   . Frequency of Social Gatherings with Friends and Family:   . Attends Religious Services:   . Active Member of Clubs or Organizations:   . Attends Archivist Meetings:   Marland Kitchen Marital Status:   Intimate Partner Violence:   . Fear of Current or Ex-Partner:   . Emotionally Abused:   Marland Kitchen Physically Abused:   . Sexually Abused:      Allergies  Allergen Reactions  . Avocado Anaphylaxis  . Latex Shortness Of Breath and Rash  . Codeine Nausea Only     Outpatient Medications Prior to Visit  Medication Sig Dispense Refill  . albuterol (VENTOLIN HFA) 108 (90 Base) MCG/ACT inhaler INHALE 2 PUFFS INTO THE LUNGS EVERY 6 (SIX) HOURS AS NEEDED FOR WHEEZING OR SHORTNESS OF BREATH. 18 g 2  . amLODipine (NORVASC) 5 MG tablet Take 1 tablet (5 mg total) by mouth daily. 90 tablet 3  . aspirin EC 81 MG tablet Take 81 mg by mouth daily.    Marland Kitchen atorvastatin  (LIPITOR) 80 MG tablet Take 1 tablet (80 mg total) by mouth daily. 90 tablet 3  . Blood Glucose Monitoring Suppl (TRUE METRIX METER) DEVI 1 kit by Does not apply route  4 (four) times daily. 1 Device 0  . busPIRone (BUSPAR) 15 MG tablet 2  qam   1  q noon  And 1 qhs 120 tablet 3  . cetirizine (ZYRTEC) 10 MG tablet Take 10 mg by mouth daily.    . Cholecalciferol (VITAMIN D) 50 MCG (2000 UT) CAPS Take 2,000 Units by mouth daily.    . cyclobenzaprine (FLEXERIL) 10 MG tablet TAKE 1 TABLET BY MOUTH 3 TIMES DAILY AS NEEDED. 30 tablet 1  . dextromethorphan-guaiFENesin (WAL-TUSSIN DM) 10-100 MG/5ML liquid Take 10 mLs by mouth 3 (three) times daily as needed (cough). 118 mL 0  . diphenhydramine-acetaminophen (TYLENOL PM) 25-500 MG TABS tablet Take 1 tablet by mouth at bedtime as needed (sleep).    Marland Kitchen doxepin (SINEQUAN) 50 MG capsule TAKE 1 CAPSULE (50 MG TOTAL) BY MOUTH AT BEDTIME. 30 capsule 2  . escitalopram (LEXAPRO) 10 MG tablet Take 3 tablets (30 mg total) by mouth daily. 30 tablet 3  . fluticasone furoate-vilanterol (BREO ELLIPTA) 200-25 MCG/INH AEPB Inhale 1 puff into the lungs daily. 60 each 6  . furosemide (LASIX) 20 MG tablet TAKE 1 TABLET (20 MG TOTAL) BY MOUTH DAILY. 30 tablet 2  . gabapentin (NEURONTIN) 600 MG tablet TAKE 1 TABLET BY MOUTH 4 TIMES DAILY (Patient taking differently: Take 600 mg by mouth as needed. ) 120 tablet 3  . glucose blood (TRUE METRIX BLOOD GLUCOSE TEST) test strip Use as instructed 100 each 12  . ipratropium-albuterol (DUONEB) 0.5-2.5 (3) MG/3ML SOLN TAKE 3 MLS VIA NEBULIZATION EVERY 6 HOURS 360 mL 1  . levothyroxine (SYNTHROID) 100 MCG tablet TAKE 1 TABLET (100 MCG TOTAL) BY MOUTH DAILY. 30 tablet 2  . losartan (COZAAR) 50 MG tablet TAKE 1.5 TABLETS (75 MG TOTAL) BY MOUTH DAILY. NEEDS APPOINTMENT WITH PCP. 45 tablet 2  . metFORMIN (GLUCOPHAGE) 500 MG tablet Take 1 tablet (500 mg total) by mouth 2 (two) times daily with a meal. 180 tablet 3  . mirtazapine (REMERON) 15 MG  tablet Take 2 tablets (30 mg total) by mouth at bedtime. 90 tablet 1  . montelukast (SINGULAIR) 10 MG tablet TAKE 1 TABLET BY MOUTH AT BEDTIME. 30 tablet 2  . nebivolol (BYSTOLIC) 10 MG tablet Take 1 tablet (10 mg total) by mouth daily. 30 tablet 4  . nitroGLYCERIN (NITROSTAT) 0.4 MG SL tablet Place 1 tablet (0.4 mg total) under the tongue every 5 (five) minutes as needed for chest pain. 90 tablet 3  . omeprazole (PRILOSEC) 40 MG capsule Take 1 capsule (40 mg total) by mouth 2 (two) times daily before a meal. 60 capsule 1  . predniSONE (DELTASONE) 10 MG tablet Take 4 tablets (40 mg total) by mouth daily with breakfast. 20 tablet 0  . ticagrelor (BRILINTA) 90 MG TABS tablet TAKE 1 TABLET BY MOUTH 2 (TWO) TIMES DAILY. (Patient taking differently: Take 90 mg by mouth 2 (two) times daily. ) 60 tablet 6  . triamcinolone cream (KENALOG) 0.1 % APPLY 1 APPLICATION TOPICALLY 2 TIMES DAILY. 30 g 1  . TRUEPLUS LANCETS 28G MISC 28 g by Does not apply route QID. 120 each 2  . umeclidinium bromide (INCRUSE ELLIPTA) 62.5 MCG/INH AEPB Inhale 1 puff into the lungs daily. 1 each 6  . Vitamin E 180 MG CAPS Take 180 mg by mouth daily.     No facility-administered medications prior to visit.     Review of Systems  Constitutional: Negative for fever.  HENT: Positive for rhinorrhea and sore throat. Negative for ear  pain.   Respiratory: Positive for sputum production, shortness of breath and wheezing. Negative for hemoptysis.   Cardiovascular: Negative for chest pain, orthopnea, leg swelling, syncope and PND.  Gastrointestinal: Positive for abdominal pain and vomiting.  Neurological: Positive for headaches.       Objective:   Physical Exam There were no vitals filed for this visit.  [id number] WF , in no distress, depressed affect  ENT: No lesions,  mouth clear,  oropharynx clear, no postnasal drip  Neck: No JVD, no TMG, no carotid bruits  Lungs: No use of accessory muscles, no dullness to percussion,  expiratory wheezes, poor airflow  Cardiovascular: RRR, heart sounds normal, no murmur or gallops, no peripheral edema  Abdomen: soft   Tender in epigastric area, no rebound or guarding,  no HSM,  BS normal  Musculoskeletal: No deformities, no cyanosis or clubbing  Neuro: alert, non focal  Skin: Warm, no lesions or rashes  No results found. PFTs 2011: FeV1 47%  FVC 90%  DLCO 67% GOLD C  9/11 CXR reviewed .  NAD.  No lung mass, copd changes 9/13 BMET Cr 0.9  K 4.3  Hgb 12.9  WBC 14.9     Assessment & Plan:  I personally reviewed all images and lab data in the [facility] system as well as any outside material available during this office visit and agree with the  radiology impressions.   No problem-specific Assessment & Plan notes found for this encounter.   There are no diagnoses linked to this encounter.  I had an extended discussion with the patient and or family lasting 10 minutes of a 25 minute visit including: smoking cessation, reviewed all meds,

## 2020-02-07 ENCOUNTER — Ambulatory Visit: Payer: Self-pay | Attending: Critical Care Medicine | Admitting: Critical Care Medicine

## 2020-02-07 ENCOUNTER — Encounter: Payer: Self-pay | Admitting: Critical Care Medicine

## 2020-02-07 DIAGNOSIS — J439 Emphysema, unspecified: Secondary | ICD-10-CM

## 2020-02-07 NOTE — Progress Notes (Signed)
Patient ID: [name] [name], female   DOB: February 19, 1956, 64 y.o.   MRN: [id number] The pt was a no show

## 2020-02-09 ENCOUNTER — Other Ambulatory Visit: Payer: Self-pay | Admitting: Internal Medicine

## 2020-02-09 MED FILL — LEVOTHYROXINE SODIUM 100 MC: 100 | 30 days supply | Qty: 30 | Fill #2

## 2020-02-09 MED FILL — FUROSEMIDE 20 MG TABS: 20 | 30 days supply | Qty: 30 | Fill #2

## 2020-02-09 NOTE — Telephone Encounter (Signed)
Requested medication (s) are due for refill today:   Provider to decide  Requested medication (s) are on the active medication list:   Yes  Future visit scheduled:   No   Last ordered: Returned because this is a non delegated refill.   Requested Prescriptions  Pending Prescriptions Disp Refills   cyclobenzaprine (FLEXERIL) 10 MG tablet [Pharmacy Med Name: CYCLOBENZAPRINE 10 MG TAB 10 Tablet] 30 tablet 1    Sig: TAKE 1 TABLET BY MOUTH 3 TIMES DAILY AS NEEDED.      Not Delegated - Analgesics:  Muscle Relaxants Failed - 02/09/2020 10:15 AM      Failed - This refill cannot be delegated      Passed - Valid encounter within last 6 months    Recent Outpatient Visits           2 days ago Pulmonary emphysema, unspecified emphysema type [facility])   [place] Elsie Stain, MD   2 weeks ago COPD exacerbation [facility])   [facility] Kerin Perna, NP   2 months ago COPD exacerbation [facility])   [place] Elsie Stain, MD   5 months ago COPD with chronic bronchitis [facility])   [place], MD   5 months ago COPD with acute exacerbation [facility])   [place] Antony Blackbird, MD       Future Appointments             Tomorrow Troy Sine, MD [place], [facility]

## 2020-02-10 ENCOUNTER — Ambulatory Visit (INDEPENDENT_AMBULATORY_CARE_PROVIDER_SITE_OTHER): Payer: Self-pay | Admitting: Cardiovascular Disease

## 2020-02-10 ENCOUNTER — Encounter: Payer: Self-pay | Admitting: Cardiovascular Disease

## 2020-02-10 ENCOUNTER — Ambulatory Visit: Payer: Self-pay | Admitting: Cardiovascular Disease

## 2020-02-10 ENCOUNTER — Other Ambulatory Visit: Payer: Self-pay

## 2020-02-10 VITALS — BP 130/74 | HR 66 | Ht 61.0 in | Wt 144.4 lb

## 2020-02-10 DIAGNOSIS — I10 Essential (primary) hypertension: Secondary | ICD-10-CM

## 2020-02-10 DIAGNOSIS — I2510 Atherosclerotic heart disease of native coronary artery without angina pectoris: Secondary | ICD-10-CM

## 2020-02-10 DIAGNOSIS — E782 Mixed hyperlipidemia: Secondary | ICD-10-CM

## 2020-02-10 DIAGNOSIS — F419 Anxiety disorder, unspecified: Secondary | ICD-10-CM

## 2020-02-10 DIAGNOSIS — Z9861 Coronary angioplasty status: Secondary | ICD-10-CM

## 2020-02-10 DIAGNOSIS — I25119 Atherosclerotic heart disease of native coronary artery with unspecified angina pectoris: Secondary | ICD-10-CM

## 2020-02-10 DIAGNOSIS — F329 Major depressive disorder, single episode, unspecified: Secondary | ICD-10-CM

## 2020-02-10 DIAGNOSIS — E119 Type 2 diabetes mellitus without complications: Secondary | ICD-10-CM

## 2020-02-10 DIAGNOSIS — Z720 Tobacco use: Secondary | ICD-10-CM

## 2020-02-10 DIAGNOSIS — F32A Depression, unspecified: Secondary | ICD-10-CM

## 2020-02-10 MED FILL — CYCLOBENZAPRINE 10 MG TAB: 10 | 10 days supply | Qty: 30 | Fill #0

## 2020-02-10 NOTE — Progress Notes (Signed)
Cardiology Office Note    Date:  02/16/2020   ID:  [name] [name], DOB November 08, 1955, MRN [id number]  PCP:  Ladell Pier, MD  Cardiologist:  Shelva Majestic, MD   Chief Complaint  Patient presents with  . Follow-up    History of Present Illness:  [name] [name] is a 64 y.o. female who presents for follow-up cardiology evaluation.  I last saw her in January 2015.  Ms. [name] [name] has known coronary artery disease in 2001 underwent initial stenting of her right coronary artery but Dr. Gwenlyn Found with placement of bare-metal stent. The following day, I perform staging intervention to proximal and mid LAD, as well as [place] arthrotomy to the ostium of her diagonal vessel. 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 She continues to have bare-metal stents in the LAD and diagonal system. She has a history of hypertension, hyperlipidemia, as well as hypothyroidism.  In September 2013 she saw Tenny Craw. At that time, she was noticing some claudication symptoms and also was experiencing some lower extremity edema.  She has a history of depression and anxiety and is followed at the [facility] mental health. Apparently she was evaluated on 07/09/2013 after experiencing a one-week history of increasing depression, anxiety, tearfulness and suicidal at a ration. She had been drinking to excess and also had used crack cocaine. She also had been hearing voices.  When I last saw her in January 2015 she was experiencing episodes of chest discomfort. These often are nonexertional. She is still smoking. She was hospitalized at behavioral health for one week and was is in outpatient treatment.  Over the last 6 years, she has continued to smoke cigarettes and has been smoking for at least 50 years.  She has COPD and had seen Dr. Annamaria Boots in the past and more recently has been seeing Dr.  Asencion Noble at [facility].  She developed an Covid infection and has been followed by Dr. Joya Gaskins.  She also is followed with behavioral therapist for severe anxiety syndrome and has been on Lexipro.   In April 2021 she was evaluated by Roby Lofts, PA-C at our office.  During her lost to follow-up time, she apparently had undergone a cardiac catheterization in September 2018 which was done by Dr. Tamala Julian which showed chronic total occlusion of the RCA due to in-stent restenosis with left-to-right collaterals, diffuse 50% narrowing in the proximal LAD, diffuse 40 to 50% in-stent restenosis in the first diagonal, 70% mid OM 2 stenosis, and 50% proximal OM 3 stenosis.  EF was 65%.  Aggressive medical therapy was recommended.  An echo Doppler study showed an EF of 55 to 60% with grade 1 diastolic dysfunction and without regional wall motion abnormalities.  She is an hyporesponder to Plavix and had in-stent restenosis with Plavix and was therefore switched to Brilinta.  In May 2021 she underwent a Lexiscan Myoview study which was low risk and showed normal perfusion.  She has a history of hypothyroidism and is on levothyroxine.  She presents for cardiology evaluation.  Past Medical History:  Diagnosis Date  . Anxiety    takes Lexapro daily  . Arthritis    "back, from neck down pass my bra area" (03/25/2017)  . Asthma   . Bartholin gland cyst 08/29/2011  . Bruises easily    pt is on Effient  .  Chronic back pain    herniated nucleus pulposus  . Chronic back pain    "neck to bra area; lower back" (03/25/2017)  . COPD (chronic obstructive pulmonary disease) ([place])    early stages  . Coronary artery disease   . Depression    takes Klonopin daily  . Diabetes mellitus without complication ([place])   . Diverticulosis   . GERD (gastroesophageal reflux disease)    takes Nexium daily  . H/O hiatal hernia   . Heart attack ([place]) 2011  . Hemorrhoids   . Hernia   .  Hyperlipidemia    takes Lipitor daily  . Hypertension    takes Losartan daily and Labetalol bid  . Hypothyroidism    takes Synthroid daily  . Insomnia    hydroxyzine prn  . Joint pain   . Pneumonia    "couple times" (03/25/2017)  . Pre-diabetes    "just found out 1 wk ago" (03/25/2017)  . Psoriasis    elbows,knees,back  . Shortness of breath    with exertion  . Slowing of urinary stream   . Stress incontinence     Past Surgical History:  Procedure Laterality Date  . ABDOMINAL EXPLORATION SURGERY  1977  . ABDOMINAL HYSTERECTOMY  1977   "left one of my ovaries"  . BACK SURGERY    . BIOPSY  05/27/2019   Procedure: BIOPSY;  Surgeon: Daneil Dolin, MD;  [facility];  Service: Endoscopy;;  . COLONOSCOPY  2011   Dr. Ardis Hughs: Mild diverticulosis, descending diminutive colon polyp (not retrieved), next colonoscopy 10 years  . CORONARY ANGIOPLASTY WITH STENT PLACEMENT  2011 X2   "regular stents didn't work; had to go back in in ~ 1 month and put in medicated stents"  . DILATION AND CURETTAGE OF UTERUS    . ESOPHAGOGASTRODUODENOSCOPY    . ESOPHAGOGASTRODUODENOSCOPY (EGD) WITH PROPOFOL N/A 05/27/2019   normal esophagus, dilation, erosive gastropathy s/p biopsy, normal duodenum. Negative H.pylori.   Marland Kitchen LEFT HEART CATH AND CORONARY ANGIOGRAPHY N/A 03/26/2017   Procedure: LEFT HEART CATH AND CORONARY ANGIOGRAPHY;  Surgeon: Belva Crome, MD;  Location: [place] CV LAB;  Service: Cardiovascular;  Laterality: N/A;  . LUMBAR LAMINECTOMY/DECOMPRESSION MICRODISCECTOMY  08/05/2011   Procedure: LUMBAR LAMINECTOMY/DECOMPRESSION MICRODISCECTOMY;  Surgeon: Otilio Connors, MD;  Location: [place] [facility]UR[facility] ORS;  Service: Neurosurgery;  Laterality: Right;  Right Lumbar four-five extraforaminal discectomy  . MALONEY DILATION N/A 05/27/2019   Procedure: Venia Minks DILATION;  Surgeon: Daneil Dolin, MD;  [facility];  Service: Endoscopy;  Laterality: N/A;  54  . TONSILLECTOMY     as a  child    Current Medications: Outpatient Medications Prior to Visit  Medication Sig Dispense Refill  . albuterol (VENTOLIN HFA) 108 (90 Base) MCG/ACT inhaler INHALE 2 PUFFS INTO THE LUNGS EVERY 6 (SIX) HOURS AS NEEDED FOR WHEEZING OR SHORTNESS OF BREATH. 18 g 2  . amLODipine (NORVASC) 5 MG tablet Take 1 tablet (5 mg total) by mouth daily. 90 tablet 3  . aspirin EC 81 MG tablet Take 81 mg by mouth daily.    Marland Kitchen atorvastatin (LIPITOR) 80 MG tablet Take 1 tablet (80 mg total) by mouth daily. 90 tablet 3  . Blood Glucose Monitoring Suppl (TRUE METRIX METER) DEVI 1 kit by Does not apply route 4 (four) times daily. 1 Device 0  . busPIRone (BUSPAR) 15 MG tablet 2  qam   1  q noon  And 1 qhs 120 tablet 3  . cetirizine (  ZYRTEC) 10 MG tablet Take 10 mg by mouth daily.    . Cholecalciferol (VITAMIN D) 50 MCG (2000 UT) CAPS Take 2,000 Units by mouth daily.    . cyclobenzaprine (FLEXERIL) 10 MG tablet TAKE 1 TABLET BY MOUTH 3 TIMES DAILY AS NEEDED. 30 tablet 1  . escitalopram (LEXAPRO) 10 MG tablet Take 3 tablets (30 mg total) by mouth daily. 30 tablet 3  . fluticasone furoate-vilanterol (BREO ELLIPTA) 200-25 MCG/INH AEPB Inhale 1 puff into the lungs daily. 60 each 6  . furosemide (LASIX) 20 MG tablet TAKE 1 TABLET (20 MG TOTAL) BY MOUTH DAILY. 30 tablet 2  . glucose blood (TRUE METRIX BLOOD GLUCOSE TEST) test strip Use as instructed 100 each 12  . ipratropium-albuterol (DUONEB) 0.5-2.5 (3) MG/3ML SOLN TAKE 3 MLS VIA NEBULIZATION EVERY 6 HOURS 360 mL 1  . levothyroxine (SYNTHROID) 100 MCG tablet TAKE 1 TABLET (100 MCG TOTAL) BY MOUTH DAILY. 30 tablet 2  . losartan (COZAAR) 50 MG tablet TAKE 1.5 TABLETS (75 MG TOTAL) BY MOUTH DAILY. NEEDS APPOINTMENT WITH PCP. 45 tablet 2  . metFORMIN (GLUCOPHAGE) 500 MG tablet Take 1 tablet (500 mg total) by mouth 2 (two) times daily with a meal. 180 tablet 3  . mirtazapine (REMERON) 15 MG tablet Take 2 tablets (30 mg total) by mouth at bedtime. 90 tablet 1  . nebivolol  (BYSTOLIC) 10 MG tablet Take 1 tablet (10 mg total) by mouth daily. 30 tablet 4  . predniSONE (DELTASONE) 10 MG tablet Take 4 tablets (40 mg total) by mouth daily with breakfast. 20 tablet 0  . ticagrelor (BRILINTA) 90 MG TABS tablet TAKE 1 TABLET BY MOUTH 2 (TWO) TIMES DAILY. (Patient taking differently: Take 90 mg by mouth 2 (two) times daily. ) 60 tablet 6  . triamcinolone cream (KENALOG) 0.1 % APPLY 1 APPLICATION TOPICALLY 2 TIMES DAILY. 30 g 1  . TRUEPLUS LANCETS 28G MISC 28 g by Does not apply route QID. 120 each 2  . umeclidinium bromide (INCRUSE ELLIPTA) 62.5 MCG/INH AEPB Inhale 1 puff into the lungs daily. 1 each 6  . Vitamin E 180 MG CAPS Take 180 mg by mouth daily.    Marland Kitchen dextromethorphan-guaiFENesin (WAL-TUSSIN DM) 10-100 MG/5ML liquid Take 10 mLs by mouth 3 (three) times daily as needed (cough). 118 mL 0  . diphenhydramine-acetaminophen (TYLENOL PM) 25-500 MG TABS tablet Take 1 tablet by mouth at bedtime as needed (sleep).    Marland Kitchen doxepin (SINEQUAN) 50 MG capsule TAKE 1 CAPSULE (50 MG TOTAL) BY MOUTH AT BEDTIME. 30 capsule 2  . gabapentin (NEURONTIN) 600 MG tablet TAKE 1 TABLET BY MOUTH 4 TIMES DAILY (Patient taking differently: Take 600 mg by mouth as needed. ) 120 tablet 3  . montelukast (SINGULAIR) 10 MG tablet TAKE 1 TABLET BY MOUTH AT BEDTIME. 30 tablet 2  . omeprazole (PRILOSEC) 40 MG capsule Take 1 capsule (40 mg total) by mouth 2 (two) times daily before a meal. 60 capsule 1  . nitroGLYCERIN (NITROSTAT) 0.4 MG SL tablet Place 1 tablet (0.4 mg total) under the tongue every 5 (five) minutes as needed for chest pain. 90 tablet 3   No facility-administered medications prior to visit.     Allergies:   Avocado, Latex, and Codeine   Social History   Socioeconomic History  . Marital status: Divorced    Spouse name: Not on file  . Number of children: Not on file  . Years of education: Not on file  . Highest education level: Not on file  Occupational History  . Not on file    Tobacco Use  . Smoking status: Current Every Day Smoker    Packs/day: 1.00    Years: 50.00    Pack years: 50.00    Types: Cigarettes  . Smokeless tobacco: Never Used  Vaping Use  . Vaping Use: Never used  Substance and Sexual Activity  . Alcohol use: Yes    Comment: once a week  . Drug use: No    Types: Cocaine    Comment: has been years per pt  . Sexual activity: Not Currently    Birth control/protection: Surgical  Other Topics Concern  . Not on file  Social History Narrative  . Not on file   Social Determinants of Health   Financial Resource Strain:   . Difficulty of Paying Living Expenses:   Food Insecurity:   . Worried About Charity fundraiser in the Last Year:   . Arboriculturist in the Last Year:   Transportation Needs:   . Film/video editor (Medical):   Marland Kitchen Lack of Transportation (Non-Medical):   Physical Activity:   . Days of Exercise per Week:   . Minutes of Exercise per Session:   Stress:   . Feeling of Stress :   Social Connections:   . Frequency of Communication with Friends and Family:   . Frequency of Social Gatherings with Friends and Family:   . Attends Religious Services:   . Active Member of Clubs or Organizations:   . Attends Archivist Meetings:   Marland Kitchen Marital Status:      Family History:  The patient's family history includes Anesthesia problems in her daughter; Cancer in her maternal aunt and paternal grandmother; Diabetes in her father; Heart disease in her father, maternal grandfather, and mother; Hypertension in her father and mother; Mental illness in her mother; Stroke in her mother; Thyroid disease in her paternal aunt.   ROS General: Negative; No fevers, chills, or night sweats;  HEENT: Negative; No changes in vision or hearing, sinus congestion, difficulty swallowing Pulmonary: Positive for COPD/asthma Cardiovascular: Negative; No chest pain, presyncope, syncope, palpitations GI: Negative; No nausea, vomiting, diarrhea,  or abdominal pain GU: Negative; No dysuria, hematuria, or difficulty voiding Musculoskeletal: Negative; no myalgias, joint pain, or weakness Hematologic/Oncology: Negative; no easy bruising, bleeding Endocrine: Positive for hypothyroidism and prediabetes Neuro: Negative; no changes in balance, headaches Skin: Negative; No rashes or skin lesions Psychiatric: Positive for anxiety Sleep: Negative; No snoring, daytime sleepiness, hypersomnolence, bruxism, restless legs, hypnogognic hallucinations, no cataplexy Other comprehensive 14 point system review is negative.   PHYSICAL EXAM:   VS:  BP (!) 130/74 (BP Location: Left Arm, Patient Position: Sitting, Cuff Size: Normal)   Pulse 66   Ht _0  (1.549 m)   Wt 144 lb 6.4 oz (65.5 kg)   BMI 27.28 kg/m     Repeat blood pressure by me 118/76  Wt Readings from Last 3 Encounters:  02/10/20 144 lb 6.4 oz (65.5 kg)  11/25/19 147 lb (66.7 kg)  11/18/19 142 lb (64.4 kg)    General: Alert, oriented, no distress.  Skin: normal turgor, no rashes, warm and dry HEENT: Normocephalic, atraumatic. Pupils equal round and reactive to light; sclera anicteric; extraocular muscles intact;  Nose without nasal septal hypertrophy Mouth/Parynx benign; Mallinpatti scale 3 Neck: No JVD, no carotid bruits; normal carotid upstroke Lungs: clear to ausculatation and percussion; no wheezing or rales Chest wall: without tenderness to palpitation Heart: PMI not displaced, RRR,  s1 s2 normal, 1/6 systolic murmur, no diastolic murmur, no rubs, gallops, thrills, or heaves Abdomen: soft, nontender; no hepatosplenomehaly, BS+; abdominal aorta nontender and not dilated by palpation. Back: no CVA tenderness Pulses 2+ Musculoskeletal: full range of motion, normal strength, no joint deformities Extremities: no clubbing cyanosis or edema, Homan's sign negative  Neurologic: grossly nonfocal; Cranial nerves grossly wnl Psychologic: Normal mood and affect   Studies/Labs  Reviewed:   ECG (independently read by me): Personally reviewed from November 08, 2019 which reveals normal sinus rhythm at 85 bpm.  QTc interval 466 ms.  No ectopy.  January 2015 EKG:  EKG is ordered today.  ECG (independently read by me): Normal sinus rhythm at 62 bpm.  No ectopy.  Normal intervals.  No ST segment changes.  Recent Labs: BMP Latest Ref Rng & Units 04/05/2019 09/17/2018 09/18/2017  Glucose 65 - 99 mg/dL 79 72 140(H)  BUN 8 - 27 mg/dL _0 Creatinine 0.57 - 1.00 mg/dL 0.92 0.84 0.84  BUN/Creat Ratio 12 - _1 Sodium 134 - 144 mmol/L 140 140 139  Potassium 3.5 - 5.2 mmol/L 5.0 4.6 4.6  Chloride 96 - 106 mmol/L 102 100 97  CO2 20 - 29 mmol/L _2 Calcium 8.7 - 10.3 mg/dL 9.6 9.8 9.4     Hepatic Function Latest Ref Rng & Units 04/05/2019 09/17/2018 09/18/2017  Total Protein 6.0 - 8.5 g/dL 6.4 6.7 6.7  Albumin 3.8 - 4.8 g/dL 3.9 4.7 4.2  AST 0 - 40 IU/L _3 ALT 0 - 32 IU/L _4 Alk Phosphatase 39 - 117 IU/L 107 91 86  Total Bilirubin 0.0 - 1.2 mg/dL 0.3 <0.2 0.2    CBC Latest Ref Rng & Units 04/05/2019 09/17/2018 09/18/2017  WBC 3.4 - 10.8 x10E3/uL 9.9 6.9 13.4(H)  Hemoglobin 11.1 - 15.9 g/dL 12.6 13.5 14.4  Hematocrit 34.0 - 46.6 % 39.4 41.5 43.2  Platelets 150 - 450 x10E3/uL 501(H) 499(H) 450(H)   Lab Results  Component Value Date   MCV 91 04/05/2019   MCV 90 09/17/2018   MCV 89 09/18/2017   Lab Results  Component Value Date   TSH 0.352 (L) 04/05/2019   Lab Results  Component Value Date   HGBA1C 5.6 11/18/2019     BNP No results found for: BNP  ProBNP No results found for: PROBNP   Lipid Panel     Component Value Date/Time   CHOL 185 04/05/2019 0926   TRIG 104 04/05/2019 0926   HDL 67 04/05/2019 0926   CHOLHDL 2.8 04/05/2019 0926   CHOLHDL 3.8 04/16/2016 0934   VLDL 15 04/16/2016 0934   LDLCALC 99 04/05/2019 0926   LABVLDL 19 04/05/2019 0926     RADIOLOGY: No results found.   Additional studies/ records that were  reviewed today include:  I reviewed my records from 69.  Subsequent records in the interim have been reviewed.  ASSESSMENT:    1. Coronary artery disease involving native coronary artery of native heart with angina pectoris ([place])   2. CAD S/P percutaneous coronary angioplasty   3. Essential hypertension   4. Mixed hyperlipidemia   5. Type 2 diabetes mellitus without complication, without long-term current use of insulin ([place])   6. Tobacco abuse   7. Anxiety and depression     PLAN:  Ms. Nzinga Ferran is a 64 year old female who has a history of known CAD and is status post PCI/DES stenting to her right  coronary artery in 2001 with subsequent staged intervention to her LAD with subsequent PCI to her diagonal.  She had developed in-stent restenosis to her RCA.  She has a history of hypertension, hyperlipidemia, diabetes mellitus, COPD with asthma, GERD as well as history of erosive gastropathy.  Since her last evaluation with me in 2015, she had undergone repeat cardiac catheterization in 2018 which showed chronic total occlusion of her RCA due to the in-stent restenosis with left-to-right collaterals.  Concomitant disease was noted in the proximal LAD first diagonal as well as circumflex marginal vessels.  She has continued to have normal systolic function.  And her most recent nuclear perfusion study is low risk and showed normal perfusion.  Her blood pressure today is controlled on her medical regimen consisting of amlodipine 5 mg furosemide 20 mg in addition to nebivolol 10 mg and losartan 75 mg daily.  She has been on aspirin and full dose ticagrelor 90 mg twice a day.  Since it has been numerous years since her last stent, I have suggested she reduce her Brilinta dose to 60 mg twice daily.  In the past she was found to be a hyporesponder to Plavix.  She is on [employer], Incruse Ellipta and DuoNeb for her COPD/asthma and also is on singulair.  Her GERD is controlled with omeprazole.  She is on  atorvastatin 80 mg for hyperlipidemia with target LDL less than 70.  Laboratory in 2020 showed an LDL at 68.  I discussed the importance of complete smoking cessation.  She will follow up with her primary providers.  As long as she is cardiac stable I will see her in 1 year for reevaluation or sooner as needed.   Medication Adjustments/Labs and Tests Ordered: Current medicines are reviewed at length with the patient today.  Concerns regarding medicines are outlined above.  Medication changes, Labs and Tests ordered today are listed in the Patient Instructions below. Patient Instructions  Medication Instructions:  CONTINUE WITH CURRENT MEDICATIONS. NO CHANGES.  *If you need a refill on your cardiac medications before your next appointment, please call your pharmacy*   Follow-Up: At [facility], you and your health needs are our priority.  As part of our continuing mission to provide you with exceptional heart care, we have created designated Provider Care Teams.  These Care Teams include your primary Cardiologist (physician) and Advanced Practice Providers (APPs -  Physician Assistants and Nurse Practitioners) who all work together to provide you with the care you need, when you need it.  We recommend signing up for the patient portal called "MyChart".  Sign up information is provided on this After Visit Summary.  MyChart is used to connect with patients for Virtual Visits (Telemedicine).  Patients are able to view lab/test results, encounter notes, upcoming appointments, etc.  Non-urgent messages can be sent to your provider as well.   To learn more about what you can do with MyChart, go to [email or web address].    Your next appointment:   12 month(s)  The format for your next appointment:   In Person  Provider:   Shelva Majestic, MD        Signed, Shelva Majestic, MD  02/16/2020 2:43 PM    [facility] [address], [place], [place], [place]   [address] Phone: [phone]  02/16/2020 2:43 PM    [facility] [address], [place], [place], [place]  [address] Phone: [phone]

## 2020-02-10 NOTE — Patient Instructions (Signed)
Medication Instructions:  °CONTINUE WITH CURRENT MEDICATIONS. NO CHANGES. ° °*If you need a refill on your cardiac medications before your next appointment, please call your pharmacy* ° ° °Follow-Up: °At [facility], you and your health needs are our priority.  As part of our continuing mission to provide you with exceptional heart care, we have created designated Provider Care Teams.  These Care Teams include your primary Cardiologist (physician) and Advanced Practice Providers (APPs -  Physician Assistants and Nurse Practitioners) who all work together to provide you with the care you need, when you need it. ° °We recommend signing up for the patient portal called "MyChart".  Sign up information is provided on this After Visit Summary.  MyChart is used to connect with patients for Virtual Visits (Telemedicine).  Patients are able to view lab/test results, encounter notes, upcoming appointments, etc.  Non-urgent messages can be sent to your provider as well.   °To learn more about what you can do with MyChart, go to [email or web address].   ° °Your next appointment:   °12 month(s) ° °The format for your next appointment:   °In Person ° °Provider:   °Thomas Kelly, MD ° ° ° °

## 2020-02-11 ENCOUNTER — Other Ambulatory Visit: Payer: Self-pay | Admitting: Internal Medicine

## 2020-02-11 ENCOUNTER — Other Ambulatory Visit (HOSPITAL_COMMUNITY): Payer: Self-pay | Admitting: Psychiatry

## 2020-02-11 ENCOUNTER — Other Ambulatory Visit: Payer: Self-pay | Admitting: Physician Assistant

## 2020-02-11 ENCOUNTER — Other Ambulatory Visit: Payer: Self-pay | Admitting: Critical Care Medicine

## 2020-02-11 DIAGNOSIS — F32A Depression, unspecified: Secondary | ICD-10-CM

## 2020-02-11 DIAGNOSIS — K219 Gastro-esophageal reflux disease without esophagitis: Secondary | ICD-10-CM

## 2020-02-11 MED FILL — $VENTOLIN HFA 18G INHALER: 108 (90 BAS | 25 days supply | Qty: 18 | Fill #2

## 2020-02-11 MED FILL — MIRTAZAPINE 15 MG TABLET: 15 | 30 days supply | Qty: 60 | Fill #1

## 2020-02-11 MED FILL — MONTELUKAST SOD 10 MG TAB: 10 | 30 days supply | Qty: 30 | Fill #0

## 2020-02-11 NOTE — Telephone Encounter (Signed)
Requested medication (s) are due for refill today - no  Requested medication (s) are on the active medication list -yes  Future visit scheduled -no  Last refill: 01/18/20  Notes to clinic: Patient is requesting RF on medication that RF should be expired- Rx 10 months ago and listed as last filled 01/18/20- sent for review   Requested Prescriptions  Pending Prescriptions Disp Refills   gabapentin (NEURONTIN) 600 MG tablet [Pharmacy Med Name: GABAPENTIN 600 MG TABLET 600 Tablet] 120 tablet 3    Sig: TAKE 1 TABLET BY MOUTH 4 TIMES DAILY      Neurology: Anticonvulsants - gabapentin Passed - 02/11/2020 11:18 AM      Passed - Valid encounter within last 12 months    Recent Outpatient Visits           4 days ago Pulmonary emphysema, unspecified emphysema type ([place])   [place] Elsie Stain, MD   3 weeks ago COPD exacerbation [facility])   [facility] Kerin Perna, NP   2 months ago COPD exacerbation [facility])   [facility] Elsie Stain, MD   5 months ago COPD with chronic bronchitis [facility])   [place] Ladell Pier, MD   5 months ago COPD with acute exacerbation [facility])   [place], MD                  Requested Prescriptions  Pending Prescriptions Disp Refills   gabapentin (NEURONTIN) 600 MG tablet [Pharmacy Med Name: GABAPENTIN 600 MG TABLET 600 Tablet] 120 tablet 3    Sig: TAKE 1 TABLET BY MOUTH 4 TIMES DAILY      Neurology: Anticonvulsants - gabapentin Passed - 02/11/2020 11:18 AM      Passed - Valid encounter within last 12 months    Recent Outpatient Visits           4 days ago Pulmonary emphysema, unspecified emphysema type ([place])   [facility] Elsie Stain, MD   3 weeks ago COPD exacerbation [facility])   [facility] Kerin Perna, NP   2  months ago COPD exacerbation [facility])   [facility] Elsie Stain, MD   5 months ago COPD with chronic bronchitis [facility])   [facility] Ladell Pier, MD   5 months ago COPD with acute exacerbation [facility])   [facility] Antony Blackbird, MD

## 2020-02-14 MED FILL — OMEPRAZOLE DR 40 MG CAPSULE: 40 | 30 days supply | Qty: 60 | Fill #0

## 2020-02-14 MED FILL — ATORVASTATIN 80 MG TABLET: 80 | 30 days supply | Qty: 30 | Fill #3

## 2020-02-14 MED FILL — LOSARTAN POTASSIUM 50 MG TA: 50 | 30 days supply | Qty: 45 | Fill #1

## 2020-02-15 ENCOUNTER — Other Ambulatory Visit (HOSPITAL_COMMUNITY): Payer: Self-pay | Admitting: *Deleted

## 2020-02-15 DIAGNOSIS — G4700 Insomnia, unspecified: Secondary | ICD-10-CM

## 2020-02-15 MED ORDER — DOXEPIN HCL 50 MG PO CAPS: 50.0000 mg | ORAL_CAPSULE | Freq: Every day | ORAL | 2 refills | Status: DC

## 2020-02-15 MED FILL — GABAPENTIN 600 MG TABLET: 600 | 30 days supply | Qty: 120 | Fill #0

## 2020-02-15 MED FILL — DOXEPIN 50 MG CAPSULE: 50 | 30 days supply | Qty: 30 | Fill #0

## 2020-02-16 ENCOUNTER — Encounter: Payer: Self-pay | Admitting: Cardiovascular Disease

## 2020-02-18 ENCOUNTER — Other Ambulatory Visit (HOSPITAL_COMMUNITY): Payer: Self-pay | Admitting: Psychiatry

## 2020-02-18 ENCOUNTER — Ambulatory Visit (INDEPENDENT_AMBULATORY_CARE_PROVIDER_SITE_OTHER): Payer: Self-pay | Admitting: Psychiatry

## 2020-02-18 ENCOUNTER — Other Ambulatory Visit: Payer: Self-pay

## 2020-02-18 DIAGNOSIS — F411 Generalized anxiety disorder: Secondary | ICD-10-CM

## 2020-02-18 DIAGNOSIS — F419 Anxiety disorder, unspecified: Secondary | ICD-10-CM

## 2020-02-18 DIAGNOSIS — G4700 Insomnia, unspecified: Secondary | ICD-10-CM

## 2020-02-18 DIAGNOSIS — F32A Depression, unspecified: Secondary | ICD-10-CM

## 2020-02-18 MED ORDER — DOXEPIN HCL 50 MG PO CAPS: 50.0000 mg | ORAL_CAPSULE | Freq: Every day | ORAL | 2 refills | Status: DC

## 2020-02-18 MED ORDER — MIRTAZAPINE 15 MG PO TABS: 30.0000 mg | ORAL_TABLET | Freq: Every day | ORAL | 1 refills | Status: DC

## 2020-02-18 MED ORDER — BUSPIRONE HCL 15 MG PO TABS: ORAL_TABLET | ORAL | 3 refills | Status: DC

## 2020-02-18 MED ORDER — ESCITALOPRAM OXALATE 10 MG PO TABS: 30.0000 mg | ORAL_TABLET | Freq: Every day | ORAL | 3 refills | Status: DC

## 2020-02-18 MED FILL — busPIRone HCL 15 MG TABS: 15 | 30 days supply | Qty: 120 | Fill #0

## 2020-02-18 MED FILL — ?MIRTAZAPINE 15 MG TABLET: 15 | 30 days supply | Qty: 60 | Fill #0

## 2020-02-18 MED FILL — ESCITALOPRAM 10 MG TABLET: 10 | 10 days supply | Qty: 30 | Fill #0

## 2020-02-18 NOTE — Progress Notes (Signed)
[facility] MD/PA/NP OP Progress Note  02/18/2020 11:27 AM [name] [name]  MRN:  [id number]  Chief Complaint: Anxiety HPI:     Today the patient is doing better.  Her spirits are improved.  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  She is less anxious.  She has had no panic attacks.  She is thinking and concentrating without problem.  Her sleep is now not a problem.  She is eating well denies use of alcohol or drugs.  Patient denies any significant problems or changes since her last visit.  She will be looking for a new dog.  Her family seems to be stable.  She takes her medicines just as prescribed.  She is done well on a lower dose of Remeron and a lower dose of Lexapro.  Past Psychiatric History: history of substance use, crack cocaine addiction, alcohol use disorder  Past Medical History:  Past Medical History:  Diagnosis Date  . Anxiety    takes Lexapro daily  . Arthritis    "back, from neck down pass my bra area" (03/25/2017)  . Asthma   . Bartholin gland cyst 08/29/2011  . Bruises easily    pt is on Effient  . Chronic back pain    herniated nucleus pulposus  . Chronic back pain    "neck to bra area; lower back" (03/25/2017)  . COPD (chronic obstructive pulmonary disease) ([place])    early stages  . Coronary artery disease   . Depression    takes Klonopin daily  . Diabetes mellitus without complication ([place])   . Diverticulosis   . GERD (gastroesophageal reflux disease)    takes Nexium daily  . H/O hiatal hernia   . Heart attack ([place]) 2011  . Hemorrhoids   . Hernia   . Hyperlipidemia    takes Lipitor daily  . Hypertension    takes Losartan daily and Labetalol bid  . Hypothyroidism    takes Synthroid daily  . Insomnia    hydroxyzine prn  . Joint pain   . Pneumonia    "couple times" (03/25/2017)  . Pre-diabetes    "just found out 1 wk ago" (03/25/2017)  . Psoriasis    elbows,knees,back  . Shortness of breath    with exertion  . Slowing of  urinary stream   . Stress incontinence     Past Surgical History:  Procedure Laterality Date  . ABDOMINAL EXPLORATION SURGERY  1977  . ABDOMINAL HYSTERECTOMY  1977   "left one of my ovaries"  . BACK SURGERY    . BIOPSY  05/27/2019   Procedure: BIOPSY;  Surgeon: Daneil Dolin, MD;  [facility];  Service: Endoscopy;;  . COLONOSCOPY  2011   Dr. Ardis Hughs: Mild diverticulosis, descending diminutive colon polyp (not retrieved), next colonoscopy 10 years  . CORONARY ANGIOPLASTY WITH STENT PLACEMENT  2011 X2   "regular stents didn't work; had to go back in in ~ 1 month and put in medicated stents"  . DILATION AND CURETTAGE OF UTERUS    . ESOPHAGOGASTRODUODENOSCOPY    . ESOPHAGOGASTRODUODENOSCOPY (EGD) WITH PROPOFOL N/A 05/27/2019   normal esophagus, dilation, erosive gastropathy s/p biopsy, normal duodenum. Negative H.pylori.   Marland Kitchen LEFT HEART CATH AND CORONARY ANGIOGRAPHY N/A 03/26/2017   Procedure: LEFT HEART CATH AND CORONARY ANGIOGRAPHY;  Surgeon: Belva Crome, MD;  Location: [facility];  Service: Cardiovascular;  Laterality: N/A;  . LUMBAR LAMINECTOMY/DECOMPRESSION MICRODISCECTOMY  08/05/2011   Procedure: LUMBAR  LAMINECTOMY/DECOMPRESSION MICRODISCECTOMY;  Surgeon: Otilio Connors, MD;  Location: [facility];  Service: Neurosurgery;  Laterality: Right;  Right Lumbar four-five extraforaminal discectomy  . MALONEY DILATION N/A 05/27/2019   Procedure: Venia Minks DILATION;  Surgeon: Daneil Dolin, MD;  [facility];  Service: Endoscopy;  Laterality: N/A;  54  . TONSILLECTOMY     as a child    Family Psychiatric History: See intake H&P for full details. Reviewed, with no updates at this time.   Family History:  Family History  Problem Relation Age of Onset  . Heart disease Mother   . Hypertension Mother   . Stroke Mother   . Mental illness Mother   . Heart disease Father   . Hypertension Father   . Diabetes Father   . Cancer Maternal Aunt        breast  .  Thyroid disease Paternal Aunt   . Heart disease Maternal Grandfather   . Anesthesia problems Daughter   . Cancer Paternal Grandmother        mouth  . Hypotension Neg Hx   . Malignant hyperthermia Neg Hx   . Pseudochol deficiency Neg Hx   . Colon cancer Neg Hx     Social History:  Social History   Socioeconomic History  . Marital status: Divorced    Spouse name: Not on file  . Number of children: Not on file  . Years of education: Not on file  . Highest education level: Not on file  Occupational History  . Not on file  Tobacco Use  . Smoking status: Current Every Day Smoker    Packs/day: 1.00    Years: 50.00    Pack years: 50.00    Types: Cigarettes  . Smokeless tobacco: Never Used  Vaping Use  . Vaping Use: Never used  Substance and Sexual Activity  . Alcohol use: Yes    Comment: once a week  . Drug use: No    Types: Cocaine    Comment: has been years per pt  . Sexual activity: Not Currently    Birth control/protection: Surgical  Other Topics Concern  . Not on file  Social History Narrative  . Not on file   Social Determinants of Health   Financial Resource Strain:   . Difficulty of Paying Living Expenses:   Food Insecurity:   . Worried About Charity fundraiser in the Last Year:   . Arboriculturist in the Last Year:   Transportation Needs:   . Film/video editor (Medical):   Marland Kitchen Lack of Transportation (Non-Medical):   Physical Activity:   . Days of Exercise per Week:   . Minutes of Exercise per Session:   Stress:   . Feeling of Stress :   Social Connections:   . Frequency of Communication with Friends and Family:   . Frequency of Social Gatherings with Friends and Family:   . Attends Religious Services:   . Active Member of Clubs or Organizations:   . Attends Archivist Meetings:   Marland Kitchen Marital Status:     Allergies:  Allergies  Allergen Reactions  . Avocado Anaphylaxis  . Latex Shortness Of Breath and Rash  . Codeine Nausea Only     Metabolic Disorder Labs: Lab Results  Component Value Date   HGBA1C 5.6 11/18/2019   No results found for: PROLACTIN Lab Results  Component Value Date   CHOL 185 04/05/2019   TRIG 104 04/05/2019   HDL 67 04/05/2019  CHOLHDL 2.8 04/05/2019   VLDL 15 04/16/2016   LDLCALC 99 04/05/2019   LDLCALC 68 09/17/2018   Lab Results  Component Value Date   TSH 0.352 (L) 04/05/2019   TSH 12.130 (H) 09/17/2018    Therapeutic Level Labs: No results found for: LITHIUM No results found for: VALPROATE No components found for:  CBMZ  Current Medications: Current Outpatient Medications  Medication Sig Dispense Refill  . albuterol (VENTOLIN HFA) 108 (90 Base) MCG/ACT inhaler INHALE 2 PUFFS INTO THE LUNGS EVERY 6 (SIX) HOURS AS NEEDED FOR WHEEZING OR SHORTNESS OF BREATH. 18 g 2  . amLODipine (NORVASC) 5 MG tablet Take 1 tablet (5 mg total) by mouth daily. 90 tablet 3  . aspirin EC 81 MG tablet Take 81 mg by mouth daily.    Marland Kitchen atorvastatin (LIPITOR) 80 MG tablet Take 1 tablet (80 mg total) by mouth daily. 90 tablet 3  . Blood Glucose Monitoring Suppl (TRUE METRIX METER) DEVI 1 kit by Does not apply route 4 (four) times daily. 1 Device 0  . busPIRone (BUSPAR) 15 MG tablet 2  qam   1  q noon  And 1 qhs 120 tablet 3  . cetirizine (ZYRTEC) 10 MG tablet Take 10 mg by mouth daily.    . Cholecalciferol (VITAMIN D) 50 MCG (2000 UT) CAPS Take 2,000 Units by mouth daily.    . cyclobenzaprine (FLEXERIL) 10 MG tablet TAKE 1 TABLET BY MOUTH 3 TIMES DAILY AS NEEDED. 30 tablet 1  . doxepin (SINEQUAN) 50 MG capsule Take 1 capsule (50 mg total) by mouth at bedtime. 30 capsule 2  . escitalopram (LEXAPRO) 10 MG tablet Take 3 tablets (30 mg total) by mouth daily. 30 tablet 3  . fluticasone furoate-vilanterol (BREO ELLIPTA) 200-25 MCG/INH AEPB Inhale 1 puff into the lungs daily. 60 each 6  . furosemide (LASIX) 20 MG tablet TAKE 1 TABLET (20 MG TOTAL) BY MOUTH DAILY. 30 tablet 2  . gabapentin (NEURONTIN) 600 MG  tablet TAKE 1 TABLET BY MOUTH 4 TIMES DAILY 120 tablet 3  . glucose blood (TRUE METRIX BLOOD GLUCOSE TEST) test strip Use as instructed 100 each 12  . ipratropium-albuterol (DUONEB) 0.5-2.5 (3) MG/3ML SOLN TAKE 3 MLS VIA NEBULIZATION EVERY 6 HOURS 360 mL 1  . levothyroxine (SYNTHROID) 100 MCG tablet TAKE 1 TABLET (100 MCG TOTAL) BY MOUTH DAILY. 30 tablet 2  . losartan (COZAAR) 50 MG tablet TAKE 1.5 TABLETS (75 MG TOTAL) BY MOUTH DAILY. NEEDS APPOINTMENT WITH PCP. 45 tablet 2  . metFORMIN (GLUCOPHAGE) 500 MG tablet Take 1 tablet (500 mg total) by mouth 2 (two) times daily with a meal. 180 tablet 3  . mirtazapine (REMERON) 15 MG tablet Take 2 tablets (30 mg total) by mouth at bedtime. 90 tablet 1  . montelukast (SINGULAIR) 10 MG tablet TAKE 1 TABLET BY MOUTH AT BEDTIME. 30 tablet 2  . nebivolol (BYSTOLIC) 10 MG tablet Take 1 tablet (10 mg total) by mouth daily. 30 tablet 4  . nitroGLYCERIN (NITROSTAT) 0.4 MG SL tablet Place 1 tablet (0.4 mg total) under the tongue every 5 (five) minutes as needed for chest pain. 90 tablet 3  . omeprazole (PRILOSEC) 40 MG capsule TAKE 1 CAPSULE (40 MG TOTAL) BY MOUTH 2 (TWO) TIMES DAILY BEFORE A MEAL. 60 capsule 1  . predniSONE (DELTASONE) 10 MG tablet Take 4 tablets (40 mg total) by mouth daily with breakfast. 20 tablet 0  . ticagrelor (BRILINTA) 90 MG TABS tablet TAKE 1 TABLET BY MOUTH 2 (TWO)  TIMES DAILY. (Patient taking differently: Take 90 mg by mouth 2 (two) times daily. ) 60 tablet 6  . triamcinolone cream (KENALOG) 0.1 % APPLY 1 APPLICATION TOPICALLY 2 TIMES DAILY. 30 g 1  . TRUEPLUS LANCETS 28G MISC 28 g by Does not apply route QID. 120 each 2  . umeclidinium bromide (INCRUSE ELLIPTA) 62.5 MCG/INH AEPB Inhale 1 puff into the lungs daily. 1 each 6  . Vitamin E 180 MG CAPS Take 180 mg by mouth daily.     No current facility-administered medications for this visit.    Musculoskeletal: Strength & Muscle Tone: within normal limits Gait & Station:  normal Patient leans: N/A  Psychiatric Specialty Exam: ROS  There were no vitals taken for this visit.There is no height or weight on file to calculate BMI.  General Appearance: Casual and Fairly Groomed  Eye Contact:  Fair  Speech:  Clear and Coherent and Normal Rate  Volume:  Normal  Mood:  Anxious and Depressed  Affect:  Appropriate and Congruent  Thought Process:  Goal Directed and Descriptions of Associations: Intact  Orientation:  Full (Time, Place, and Person)  Thought Content: Logical   Suicidal Thoughts:  No  Homicidal Thoughts:  No  Memory:  Immediate;   Good  Judgement:  Fair  Insight:  Fair  Psychomotor Activity:  Normal  Concentration:  Concentration: Fair  Recall:  [place] of Knowledge: Fair  Language: Fair  Akathisia:  Negative  Handed:  Right  AIMS (if indicated): not done  Assets:  Communication Skills Desire for Improvement Housing  ADL's:  Intact  Cognition: WNL  Sleep:  Poor   Screenings: AUDIT     Admission (Discharged) from 07/10/2013 in [facility]  Alcohol Use Disorder Identification Test Final Score (AUDIT) 12    GAD-7     Telemedicine from 01/20/2020 in [facility] Office Visit from 11/18/2019 in [facility] Visit from 09/06/2019 in [facility] Office Visit from 12/25/2017 in [facility] Counselor from 12/22/2017 in [facility]  Total GAD-7 Score 0 7 0 18 21    PHQ2-9     Telemedicine from 01/20/2020 in [facility] Office Visit from 11/18/2019 in [facility] Visit from 09/06/2019 in [facility] Office Visit from 03/25/2019 in [facility] Visit from 12/25/2017 in [facility] [facility]  PHQ-2 Total Score 0 2 0 0 6  PHQ-9 Total  Score -- 6 -- 0 24       Assessment and Plan:   This patient carries a diagnosis of generalized anxiety disorder.  She does well taking BuSpar 15 mg 2 in the morning 1 midday 1 at night.  The patient second problem is that of major depression.  She takes Lexapro 10 mg and Remeron 15 mg.  Her third problem is that of insomnia.  She takes a low-dose of doxepin which is very helpful.  Combination of all these medications lead to a good state that she is doing well.  She is functioning well. No diagnosis found.  Status of current problems: new to [employer] Ordered: No orders of the defined types were placed in this encounter.   Labs Reviewed: na  Collateral Obtained/Records Reviewed: Reviewed notes from Dr. Adele Schilder  Plan:  Increase Lexapro to 20  mg Increase BuSpar to 15 mg 3 times daily Seroquel discontinued by patient given  restless legs and leg cramps Initiate Remeron 15 mg nightly Continue individual therapy and palliative/hospice grief counselor   Jerral Ralph, MD 02/18/2020, 11:26 AM

## 2020-02-21 ENCOUNTER — Ambulatory Visit (HOSPITAL_COMMUNITY): Payer: Self-pay | Admitting: Licensed Clinical Social Worker

## 2020-02-22 ENCOUNTER — Encounter (HOSPITAL_COMMUNITY): Payer: Self-pay | Admitting: Licensed Clinical Social Worker

## 2020-02-22 ENCOUNTER — Other Ambulatory Visit: Payer: Self-pay

## 2020-02-22 ENCOUNTER — Encounter: Payer: Self-pay | Admitting: Family Medicine

## 2020-02-22 ENCOUNTER — Ambulatory Visit: Payer: Medicaid Other | Attending: Family Medicine | Admitting: Family Medicine

## 2020-02-22 ENCOUNTER — Ambulatory Visit (INDEPENDENT_AMBULATORY_CARE_PROVIDER_SITE_OTHER): Payer: Self-pay | Admitting: Licensed Clinical Social Worker

## 2020-02-22 DIAGNOSIS — F329 Major depressive disorder, single episode, unspecified: Secondary | ICD-10-CM

## 2020-02-22 DIAGNOSIS — F32A Depression, unspecified: Secondary | ICD-10-CM

## 2020-02-22 DIAGNOSIS — K529 Noninfective gastroenteritis and colitis, unspecified: Secondary | ICD-10-CM

## 2020-02-22 DIAGNOSIS — J441 Chronic obstructive pulmonary disease with (acute) exacerbation: Secondary | ICD-10-CM

## 2020-02-22 DIAGNOSIS — F411 Generalized anxiety disorder: Secondary | ICD-10-CM

## 2020-02-22 MED ORDER — AZITHROMYCIN 250 MG PO TABS: ORAL_TABLET | ORAL | 0 refills | Status: DC

## 2020-02-22 MED ORDER — PREDNISONE 20 MG PO TABS: 40.0000 mg | ORAL_TABLET | Freq: Every day | ORAL | 0 refills | Status: DC
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
## 2020-02-22 NOTE — Progress Notes (Signed)
States that the left side of her throat is on fire.  She has  had only 1 fever she states, also having diarrhea today

## 2020-02-22 NOTE — Progress Notes (Signed)
Virtual Visit via Telephone Note  I connected with [name] [name], on 02/22/2020 at 3:55 PM by telephone due to the COVID-19 pandemic and verified that I am speaking with the correct person using two identifiers.   Consent: I discussed the limitations, risks, security and privacy concerns of performing an evaluation and management service by telephone and the availability of in person appointments. I also discussed with the patient that there may be a patient responsible charge related to this service. The patient expressed understanding and agreed to proceed.   Location of Patient: Home  Location of Provider: Clinic   Persons participating in Telemedicine visit: Jadi Deyarmin Farrington-CMA Dr. Margarita Rana     History of Present Illness: 64 year old female patient of Dr Wynetta Emery with a history of COPD, tobacco abuse, type 2 diabetes mellitus, CAD (status post PCI), bipolar disorder seen for an acute visit today.   She complains the left side of her throat feels like fire and this has been on for the last 2 days.  She typically has a chronic cough and dyspnea but her cough has worsened and is productive of sputum, her dyspnea has worsened, her nebulizer has not provided much relief. Has also had diarrhea which started today, has abdominal cramps 'like her ovary hurting' Her friend who lives beneath her apartment has diarrhea as well. Thinks she had a fever which broke She has received the COVID vaccine; states she goes to the grocery store but has not been in public places.  Past Medical History:  Diagnosis Date  . Anxiety    takes Lexapro daily  . Arthritis    "back, from neck down pass my bra area" (03/25/2017)  . Asthma   . Bartholin gland cyst 08/29/2011  . Bruises easily    pt is on Effient  . Chronic back pain    herniated nucleus pulposus  . Chronic back pain    "neck to bra area; lower back" (03/25/2017)  . COPD (chronic obstructive pulmonary disease) ([place])     early stages  . Coronary artery disease   . Depression    takes Klonopin daily  . Diabetes mellitus without complication ([place])   . Diverticulosis   . GERD (gastroesophageal reflux disease)    takes Nexium daily  . H/O hiatal hernia   . Heart attack ([place]) 2011  . Hemorrhoids   . Hernia   . Hyperlipidemia    takes Lipitor daily  . Hypertension    takes Losartan daily and Labetalol bid  . Hypothyroidism    takes Synthroid daily  . Insomnia    hydroxyzine prn  . Joint pain   . Pneumonia    "couple times" (03/25/2017)  . Pre-diabetes    "just found out 1 wk ago" (03/25/2017)  . Psoriasis    elbows,knees,back  . Shortness of breath    with exertion  . Slowing of urinary stream   . Stress incontinence    Allergies  Allergen Reactions  . Avocado Anaphylaxis  . Latex Shortness Of Breath and Rash  . Codeine Nausea Only    Current Outpatient Medications on File Prior to Visit  Medication Sig Dispense Refill  . albuterol (VENTOLIN HFA) 108 (90 Base) MCG/ACT inhaler INHALE 2 PUFFS INTO THE LUNGS EVERY 6 (SIX) HOURS AS NEEDED FOR WHEEZING OR SHORTNESS OF BREATH. 18 g 2  . amLODipine (NORVASC) 5 MG tablet Take 1 tablet (5 mg total) by mouth daily. 90 tablet 3  . aspirin EC 81 MG tablet Take  81 mg by mouth daily.    Marland Kitchen atorvastatin (LIPITOR) 80 MG tablet Take 1 tablet (80 mg total) by mouth daily. 90 tablet 3  . Blood Glucose Monitoring Suppl (TRUE METRIX METER) DEVI 1 kit by Does not apply route 4 (four) times daily. 1 Device 0  . busPIRone (BUSPAR) 15 MG tablet 2  qam   1  q noon  And 1 qhs 120 tablet 3  . cetirizine (ZYRTEC) 10 MG tablet Take 10 mg by mouth daily.    . Cholecalciferol (VITAMIN D) 50 MCG (2000 UT) CAPS Take 2,000 Units by mouth daily.    . cyclobenzaprine (FLEXERIL) 10 MG tablet TAKE 1 TABLET BY MOUTH 3 TIMES DAILY AS NEEDED. 30 tablet 1  . doxepin (SINEQUAN) 50 MG capsule Take 1 capsule (50 mg total) by mouth at bedtime. 30 capsule 2  . escitalopram (LEXAPRO) 10 MG  tablet Take 3 tablets (30 mg total) by mouth daily. 30 tablet 3  . fluticasone furoate-vilanterol (BREO ELLIPTA) 200-25 MCG/INH AEPB Inhale 1 puff into the lungs daily. 60 each 6  . furosemide (LASIX) 20 MG tablet TAKE 1 TABLET (20 MG TOTAL) BY MOUTH DAILY. 30 tablet 2  . gabapentin (NEURONTIN) 600 MG tablet TAKE 1 TABLET BY MOUTH 4 TIMES DAILY 120 tablet 3  . glucose blood (TRUE METRIX BLOOD GLUCOSE TEST) test strip Use as instructed 100 each 12  . ipratropium-albuterol (DUONEB) 0.5-2.5 (3) MG/3ML SOLN TAKE 3 MLS VIA NEBULIZATION EVERY 6 HOURS 360 mL 1  . levothyroxine (SYNTHROID) 100 MCG tablet TAKE 1 TABLET (100 MCG TOTAL) BY MOUTH DAILY. 30 tablet 2  . losartan (COZAAR) 50 MG tablet TAKE 1.5 TABLETS (75 MG TOTAL) BY MOUTH DAILY. NEEDS APPOINTMENT WITH PCP. 45 tablet 2  . metFORMIN (GLUCOPHAGE) 500 MG tablet Take 1 tablet (500 mg total) by mouth 2 (two) times daily with a meal. 180 tablet 3  . mirtazapine (REMERON) 15 MG tablet Take 2 tablets (30 mg total) by mouth at bedtime. 90 tablet 1  . montelukast (SINGULAIR) 10 MG tablet TAKE 1 TABLET BY MOUTH AT BEDTIME. 30 tablet 2  . nebivolol (BYSTOLIC) 10 MG tablet Take 1 tablet (10 mg total) by mouth daily. 30 tablet 4  . omeprazole (PRILOSEC) 40 MG capsule TAKE 1 CAPSULE (40 MG TOTAL) BY MOUTH 2 (TWO) TIMES DAILY BEFORE A MEAL. 60 capsule 1  . predniSONE (DELTASONE) 10 MG tablet Take 4 tablets (40 mg total) by mouth daily with breakfast. 20 tablet 0  . ticagrelor (BRILINTA) 90 MG TABS tablet TAKE 1 TABLET BY MOUTH 2 (TWO) TIMES DAILY. (Patient taking differently: Take 90 mg by mouth 2 (two) times daily. ) 60 tablet 6  . triamcinolone cream (KENALOG) 0.1 % APPLY 1 APPLICATION TOPICALLY 2 TIMES DAILY. 30 g 1  . TRUEPLUS LANCETS 28G MISC 28 g by Does not apply route QID. 120 each 2  . umeclidinium bromide (INCRUSE ELLIPTA) 62.5 MCG/INH AEPB Inhale 1 puff into the lungs daily. 1 each 6  . Vitamin E 180 MG CAPS Take 180 mg by mouth daily.    .  nitroGLYCERIN (NITROSTAT) 0.4 MG SL tablet Place 1 tablet (0.4 mg total) under the tongue every 5 (five) minutes as needed for chest pain. 90 tablet 3   No current facility-administered medications on file prior to visit.    Observations/Objective: Awake, alert, oriented x3 Not in acute distress Heard to be coughing over the phone  Assessment and Plan: 1. COPD exacerbation ([place]) Suspicious for COPD exacerbation  Advised that if symptoms persist despite treatment she would need to have a Covid test to be evaluated for possible breakthrough infection - azithromycin (ZITHROMAX) 250 MG tablet; Take orally 2 tabs (500 mg) on day 1 then 1 tab (250 mg) on days 2-5  Dispense: 6 tablet; Refill: 0 - predniSONE (DELTASONE) 20 MG tablet; Take 2 tablets (40 mg total) by mouth daily with breakfast.  Dispense: 10 tablet; Refill: 0  2. Gastroenteritis Increase hydration BRAT diet See #1 above   Follow Up Instructions: Keep appointment with PCP for previously scheduled chronic disease management   I discussed the assessment and treatment plan with the patient. The patient was provided an opportunity to ask questions and all were answered. The patient agreed with the plan and demonstrated an understanding of the instructions.   The patient was advised to call back or seek an in-person evaluation if the symptoms worsen or if the condition fails to improve as anticipated.     I provided 13 minutes total of non-face-to-face time during this encounter including median intraservice time, reviewing previous notes, investigations, ordering medications, medical decision making, coordinating care and patient verbalized understanding at the end of the visit.     Charlott Rakes, MD, FAAFP. [facility] and [place], [place]   02/22/2020, 3:55 PM

## 2020-02-23 MED FILL — ?AZITHROMYCIN 250MG TABLETS: 250 | 5 days supply | Qty: 6 | Fill #0

## 2020-02-23 MED FILL — ?PREDNISONE 20MG TABLET: 20 | 5 days supply | Qty: 10 | Fill #0

## 2020-02-29 ENCOUNTER — Other Ambulatory Visit: Payer: Self-pay | Admitting: Internal Medicine

## 2020-02-29 ENCOUNTER — Ambulatory Visit: Payer: Medicaid Other | Admitting: Gastroenterology

## 2020-02-29 ENCOUNTER — Encounter: Payer: Self-pay | Admitting: Internal Medicine

## 2020-02-29 ENCOUNTER — Other Ambulatory Visit: Payer: Self-pay | Admitting: Critical Care Medicine

## 2020-02-29 DIAGNOSIS — E039 Hypothyroidism, unspecified: Secondary | ICD-10-CM

## 2020-02-29 DIAGNOSIS — I10 Essential (primary) hypertension: Secondary | ICD-10-CM

## 2020-02-29 MED FILL — $BREO ELLIPTA 200-25 MCG IN: 200-25 | 30 days supply | Qty: 60 | Fill #3

## 2020-02-29 MED FILL — ?FUROSEMIDE 20MG TABLET: 20 | 30 days supply | Qty: 30 | Fill #0

## 2020-02-29 MED FILL — ?LEVOTHYROXINE SODIUM 100MC: 100 | 30 days supply | Qty: 30 | Fill #0

## 2020-02-29 MED FILL — ?AMLODIPINE BESYLATE 5MG TA: 5 | 30 days supply | Qty: 30 | Fill #3

## 2020-02-29 MED FILL — ESCITALOPRAM 10 MG TABLET: 10 | 10 days supply | Qty: 30 | Fill #1

## 2020-03-01 ENCOUNTER — Ambulatory Visit: Payer: Self-pay

## 2020-03-01 ENCOUNTER — Ambulatory Visit: Payer: Self-pay | Admitting: *Deleted

## 2020-03-01 DIAGNOSIS — R059 Cough, unspecified: Secondary | ICD-10-CM

## 2020-03-01 DIAGNOSIS — R05 Cough: Secondary | ICD-10-CM

## 2020-03-01 NOTE — Addendum Note (Signed)
Addended by: Karle Plumber B on: 03/01/2020 01:37 PM   Modules accepted: Orders

## 2020-03-01 NOTE — Telephone Encounter (Signed)
Patient calledstating that she has a COPD flare.  She states she was treated about 2 weeks ago but has had no relief.  She states that she is coughing up yellow brown phlegm. She has no fever.  She is SOB with activity but is able to sleep flat in bed. She has Hx of COPD and MI.  She denies chest pain. She does wheeze. Care advice read to patient. Per protocol patient needs virtual appointment today. Called office for advice left VM with triage nurse there. Patient states that she can not go to UC because she has no funds for treatment and need to come to the office. Patient advised that note will be sent to office for evaluation but that if her symptoms worsen she should be seen today and she could call 911 for the hospital. She verbalized understanding of all information. Will route note to office for consideration and further direction for patient.  Reason for Disposition . [1] MILD difficulty breathing (e.g., minimal/no SOB at rest, SOB with walking, pulse <100) AND [2] still present when not coughing (Exception: no change from usual, chronic shortness of breath)  Answer Assessment - Initial Assessment Questions 1. ONSET: "When did the cough begin?"      2 weeks and has been treated 2. SEVERITY: "How bad is the cough today?"     Severe phlgm 3. SPUTUM: "Describe the color of your sputum" (none, dry cough; clear, white, yellow, green)     Yellow/brown 4. HEMOPTYSIS: "Are you coughing up any blood?" If so ask: "How much?" (flecks, streaks, tablespoons, etc.)    none 5. DIFFICULTY BREATHING: "Are you having difficulty breathing?" If Yes, ask: "How bad is it?" (e.g., mild, moderate, severe)    - MILD: No SOB at rest, mild SOB with walking, speaks normally in sentences, can lay down, no retractions, pulse < 100.    - MODERATE: SOB at rest, SOB with minimal exertion and prefers to sit, cannot lie down flat, speaks in phrases, mild retractions, audible wheezing, pulse 100-120.    - SEVERE: Very SOB at  rest, speaks in single words, struggling to breathe, sitting hunched forward, retractions, pulse > 120      Mild 6. FEVER: "Do you have a fever?" If Yes, ask: "What is your temperature, how was it measured, and when did it start?"   No 7. CARDIAC HISTORY: "Do you have any history of heart disease?" (e.g., heart attack, congestive heart failure)     Heart attack 8. LUNG HISTORY: "Do you have any history of lung disease?"  (e.g., pulmonary embolus, asthma, emphysema)    COPD 9. PE RISK FACTORS: "Do you have a history of blood clots?" (or: recent major surgery, recent prolonged travel, bedridden)    no 10. OTHER SYMPTOMS: "Do you have any other symptoms?" (e.g., runny nose, wheezing, chest pain)       wheezing 11. PREGNANCY: "Is there any chance you are pregnant?" "When was your last menstrual period?"       N/A 12. TRAVEL: "Have you traveled out of the country in the last month?" (e.g., travel history, exposures)      No  Protocols used: COUGH - CHRONIC-A-AH

## 2020-03-01 NOTE — Telephone Encounter (Signed)
Will forward to pcp

## 2020-03-01 NOTE — Telephone Encounter (Signed)
FYI documentation

## 2020-03-01 NOTE — Telephone Encounter (Signed)
Summary: cough with mucus- requesting to speak with nurse    Patient requesting to speak with nurse regarding a cough with mucus that has not been resolved with medications.      Patient was seen -video visit for cough on 8/10. Patient was treated with prednisone and antibiotic. Patient states she is not better- she is not able to move congestion out of chest. Advised ED due to increasing symptoms- she will not go. She wants to be COVID tested at office and have appointment there. Attempted to call [facility]- no answer. Will send call high priority as patient is refusing ED and needs to be seen  Reason for Disposition . [1] MODERATE difficulty breathing (e.g., speaks in phrases, SOB even at rest, pulse 100-120) AND [2] still present when not coughing  Answer Assessment - Initial Assessment Questions 1. ONSET: "When did the cough begin?"      2 weeks 2. SEVERITY: "How bad is the cough today?"      Cough when talks 3. SPUTUM: "Describe the color of your sputum" (none, dry cough; clear, white, yellow, green)     Stopped productive last night- had been coughing yellow to dark brownish 4. HEMOPTYSIS: "Are you coughing up any blood?" If so ask: "How much?" (flecks, streaks, tablespoons, etc.)     no 5. DIFFICULTY BREATHING: "Are you having difficulty breathing?" If Yes, ask: "How bad is it?" (e.g., mild, moderate, severe)    - MILD: No SOB at rest, mild SOB with walking, speaks normally in sentences, can lay down, no retractions, pulse < 100.    - MODERATE: SOB at rest, SOB with minimal exertion and prefers to sit, cannot lie down flat, speaks in phrases, mild retractions, audible wheezing, pulse 100-120.    - SEVERE: Very SOB at rest, speaks in single words, struggling to breathe, sitting hunched forward, retractions, pulse > 120      moderate 6. FEVER: "Do you have a fever?" If Yes, ask: "What is your temperature, how was it measured, and when did it start?"     No fever 7. CARDIAC HISTORY: "Do you have  any history of heart disease?" (e.g., heart attack, congestive heart failure)      Hx heart attack 8. LUNG HISTORY: "Do you have any history of lung disease?"  (e.g., pulmonary embolus, asthma, emphysema)     COPD 9. PE RISK FACTORS: "Do you have a history of blood clots?" (or: recent major surgery, recent prolonged travel, bedridden)     no 10. OTHER SYMPTOMS: "Do you have any other symptoms?" (e.g., runny nose, wheezing, chest pain)       Congestion, wheezing yesterday 11. PREGNANCY: "Is there any chance you are pregnant?" "When was your last menstrual period?"       n/a 12. TRAVEL: "Have you traveled out of the country in the last month?" (e.g., travel history, exposures)       no  Protocols used: [place]

## 2020-03-01 NOTE — Telephone Encounter (Signed)
Contacted pt and left detailed vm

## 2020-03-04 LAB — SARS-COV-2, NAA 2 DAY TAT

## 2020-03-04 LAB — NOVEL CORONAVIRUS, NAA: SARS-CoV-2, NAA: NOT DETECTED

## 2020-03-10 MED FILL — DOXEPIN 50 MG CAPSULE: 50 | 30 days supply | Qty: 30 | Fill #0

## 2020-03-10 MED FILL — ESCITALOPRAM 10 MG TABLET: 10 | 10 days supply | Qty: 30 | Fill #2

## 2020-03-13 ENCOUNTER — Encounter (HOSPITAL_COMMUNITY): Payer: Self-pay | Admitting: Licensed Clinical Social Worker

## 2020-03-13 ENCOUNTER — Other Ambulatory Visit: Payer: Self-pay

## 2020-03-13 ENCOUNTER — Ambulatory Visit (INDEPENDENT_AMBULATORY_CARE_PROVIDER_SITE_OTHER): Payer: HRSA Program | Admitting: Licensed Clinical Social Worker

## 2020-03-13 DIAGNOSIS — F32A Depression, unspecified: Secondary | ICD-10-CM

## 2020-03-13 DIAGNOSIS — F411 Generalized anxiety disorder: Secondary | ICD-10-CM

## 2020-03-13 DIAGNOSIS — F329 Major depressive disorder, single episode, unspecified: Secondary | ICD-10-CM
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
## 2020-03-14 ENCOUNTER — Ambulatory Visit: Payer: Self-pay | Admitting: Internal Medicine

## 2020-03-14 ENCOUNTER — Ambulatory Visit: Payer: Medicaid Other | Attending: Internal Medicine | Admitting: Internal Medicine

## 2020-03-14 DIAGNOSIS — J441 Chronic obstructive pulmonary disease with (acute) exacerbation: Secondary | ICD-10-CM

## 2020-03-14 DIAGNOSIS — J0111 Acute recurrent frontal sinusitis: Secondary | ICD-10-CM

## 2020-03-14 MED ORDER — AMOXICILLIN-POT CLAVULANATE 500-125 MG PO TABS: 1.0000 | ORAL_TABLET | Freq: Three times a day (TID) | ORAL | 0 refills | Status: DC

## 2020-03-14 MED ORDER — PREDNISONE 20 MG PO TABS: 20.0000 mg | ORAL_TABLET | Freq: Every day | ORAL | 0 refills | Status: DC

## 2020-03-14 MED FILL — ?PREDNISONE 20MG TABLET: 20 | 5 days supply | Qty: 5 | Fill #0

## 2020-03-14 MED FILL — GABAPENTIN 600 MG TABLET: 600 | 30 days supply | Qty: 120 | Fill #1

## 2020-03-14 MED FILL — busPIRone HCL 15 MG TABS: 15 | 30 days supply | Qty: 120 | Fill #1

## 2020-03-14 MED FILL — AMOX-CLAV 500-125 MG TABLET: 500-125 | 8 days supply | Qty: 24 | Fill #0

## 2020-03-14 MED FILL — ATORVASTATIN 80 MG TABLET: 80 | 30 days supply | Qty: 30 | Fill #4

## 2020-03-14 NOTE — Telephone Encounter (Signed)
Pt. Called to report shortness of breath.  Stated "I have a sinus infection that settled into my chest."  Stated "when I breathe, my lungs hurt."  Stated her SOB is "severe" at times.  Hx of COPD.  Reported productive cough with yellow sputum.  Denied fever or chills.  Reported she has gotten worse after finishing the antibiotic and Prednisone that was prescribed on 02/22/20.  Stated that she has had known exposure to COVID through a friend that she was with recently.  Advised she will need to go to the ER with current symptoms.  Pt. Stated "I will not go to the ER; Dr. Joya Gaskins and Dr. Wynetta Emery know me, and all I need is for them to call me in a full prescription of an antibiotic and prednisone!"  Advised pt. I will notify nurse in office of her symptoms.  Pt. requested that she be called back.  Phone call to office.  Spoke w/ triage nurse, Sunday Spillers. Sched. MyChart video visit at 9:50 AM.  Pt. Notified of the plan and agreed.  Reason for Disposition  [1] MODERATE difficulty breathing (e.g., speaks in phrases, SOB even at rest, pulse 100-120) AND [2] NEW-onset or WORSE than normal    Refusing to go to the ER  Answer Assessment - Initial Assessment Questions 1. RESPIRATORY STATUS: "Describe your breathing?" (e.g., wheezing, shortness of breath, unable to speak, severe coughing)      C/o shortness of breath and productive cough 2. ONSET: "When did this breathing problem begin?"     Since she had Telemedicine visit 8/10 3. PATTERN "Does the difficult breathing come and go, or has it been constant since it started?"      SOB with walking and now increasing  4. SEVERITY: "How bad is your breathing?" (e.g., mild, moderate, severe)    - MILD: No SOB at rest, mild SOB with walking, speaks normally in sentences, can lay down, no retractions, pulse < 100.    - MODERATE: SOB at rest, SOB with minimal exertion and prefers to sit, cannot lie down flat, speaks in phrases, mild retractions, audible wheezing, pulse  100-120.    - SEVERE: Very SOB at rest, speaks in single words, struggling to breathe, sitting hunched forward, retractions, pulse > 120      Mod- severe 5. RECURRENT SYMPTOM: "Have you had difficulty breathing before?" If Yes, ask: "When was the last time?" and "What happened that time?"      Yes; hx of COPD 6. CARDIAC HISTORY: "Do you have any history of heart disease?" (e.g., heart attack, angina, bypass surgery, angioplasty)      *No Answer* 7. LUNG HISTORY: "Do you have any history of lung disease?"  (e.g., pulmonary embolus, asthma, emphysema)     COPD 8. CAUSE: "What do you think is causing the breathing problem?"      COPD  9. OTHER SYMPTOMS: "Do you have any other symptoms? (e.g., dizziness, runny nose, cough, chest pain, fever)    " When I breathe my lungs hurt"; cough and SOB; recent exposure to COVID 10. PREGNANCY: "Is there any chance you are pregnant?" "When was your last menstrual period?"       N/a  11. TRAVEL: "Have you traveled out of the country in the last month?" (e.g., travel history, exposures)       *No Answer*  Protocols used: BREATHING DIFFICULTY-A-AH

## 2020-03-14 NOTE — Progress Notes (Signed)
Pt states her lungs are hurting   Pt states she is hurting in her chest   Pt states her pain started last night   Pt states she is spitting up yellow phlegm

## 2020-03-14 NOTE — Progress Notes (Signed)
Virtual Visit via Telephone Note Due to current restrictions/limitations of in-office visits due to the COVID-19 pandemic, this scheduled clinical appointment was converted to a telehealth visit  I connected with [name] [name] on 03/14/20 at 10:51 a.m EDT by telephone and verified that I am speaking with the correct person using two identifiers. I am in my office.  The patient is at home.  Only the patient and myself participated in this encounter.  I discussed the limitations, risks, security and privacy concerns of performing an evaluation and management service by telephone and the availability of in person appointments. I also discussed with the patient that there may be a patient responsible charge related to this service. The patient expressed understanding and agreed to proceed.   History of Present Illness: Patient with history ofpre-DM, GERD, hypothyroidism, HTN, HL, CAD with previous stent, COPD, substance abuse, depression/anxietyand tobacco dependence.  Purpose of today's visit is urgent care.  Pt had a telephone visit with Dr. Margarita Rana about 3 wks ago for sore throat, worsening cough and dyspnea.  Treated with Z-pack and Prednisone for COPD exacerbation. -Since then she feels she has not gotten a whole lot better.  She feels the Zithromax was not strong enough. Last night she started having pain in RT lung and coughing up yellow phlegm. No fever.  Some nasal congestion for which she takes an OTC decongestant.  She does not have the name of it available at this time.  She reports that she is still more short of breath than usual and has had frontal HA for 3 days.  Feels she has a sinus infection and Z-pack was not strong enough. Wants to be given a nasal spray which her brother uses but does not recall the name. -used her nebulizer last night and this a.m  Outpatient Encounter Medications as of 03/14/2020  Medication Sig   albuterol (VENTOLIN HFA) 108 (90 Base) MCG/ACT inhaler INHALE  2 PUFFS INTO THE LUNGS EVERY 6 (SIX) HOURS AS NEEDED FOR WHEEZING OR SHORTNESS OF BREATH.   amLODipine (NORVASC) 5 MG tablet Take 1 tablet (5 mg total) by mouth daily.   aspirin EC 81 MG tablet Take 81 mg by mouth daily.   atorvastatin (LIPITOR) 80 MG tablet Take 1 tablet (80 mg total) by mouth daily.   azithromycin (ZITHROMAX) 250 MG tablet Take orally 2 tabs (500 mg) on day 1 then 1 tab (250 mg) on days 2-5 (Patient not taking: Reported on 03/14/2020)   Blood Glucose Monitoring Suppl (TRUE METRIX METER) DEVI 1 kit by Does not apply route 4 (four) times daily.   busPIRone (BUSPAR) 15 MG tablet 2  qam   1  q noon  And 1 qhs   cetirizine (ZYRTEC) 10 MG tablet Take 10 mg by mouth daily.   Cholecalciferol (VITAMIN D) 50 MCG (2000 UT) CAPS Take 2,000 Units by mouth daily.   cyclobenzaprine (FLEXERIL) 10 MG tablet TAKE 1 TABLET BY MOUTH 3 TIMES DAILY AS NEEDED.   doxepin (SINEQUAN) 50 MG capsule Take 1 capsule (50 mg total) by mouth at bedtime.   escitalopram (LEXAPRO) 10 MG tablet Take 3 tablets (30 mg total) by mouth daily.   fluticasone furoate-vilanterol (BREO ELLIPTA) 200-25 MCG/INH AEPB Inhale 1 puff into the lungs daily.   furosemide (LASIX) 20 MG tablet TAKE 1 TABLET (20 MG TOTAL) BY MOUTH DAILY.   gabapentin (NEURONTIN) 600 MG tablet TAKE 1 TABLET BY MOUTH 4 TIMES DAILY   glucose blood (TRUE METRIX BLOOD GLUCOSE TEST) test  strip Use as instructed   ipratropium-albuterol (DUONEB) 0.5-2.5 (3) MG/3ML SOLN TAKE 3 MLS VIA NEBULIZATION EVERY 6 HOURS   levothyroxine (SYNTHROID) 100 MCG tablet TAKE 1 TABLET (100 MCG TOTAL) BY MOUTH DAILY.   losartan (COZAAR) 50 MG tablet TAKE 1.5 TABLETS (75 MG TOTAL) BY MOUTH DAILY. NEEDS APPOINTMENT WITH PCP.   metFORMIN (GLUCOPHAGE) 500 MG tablet Take 1 tablet (500 mg total) by mouth 2 (two) times daily with a meal.   mirtazapine (REMERON) 15 MG tablet Take 2 tablets (30 mg total) by mouth at bedtime.   montelukast (SINGULAIR) 10 MG tablet  TAKE 1 TABLET BY MOUTH AT BEDTIME.   nebivolol (BYSTOLIC) 10 MG tablet Take 1 tablet (10 mg total) by mouth daily.   nitroGLYCERIN (NITROSTAT) 0.4 MG SL tablet Place 1 tablet (0.4 mg total) under the tongue every 5 (five) minutes as needed for chest pain.   omeprazole (PRILOSEC) 40 MG capsule TAKE 1 CAPSULE (40 MG TOTAL) BY MOUTH 2 (TWO) TIMES DAILY BEFORE A MEAL.   predniSONE (DELTASONE) 20 MG tablet Take 2 tablets (40 mg total) by mouth daily with breakfast. (Patient not taking: Reported on 03/14/2020)   ticagrelor (BRILINTA) 90 MG TABS tablet TAKE 1 TABLET BY MOUTH 2 (TWO) TIMES DAILY. (Patient taking differently: Take 90 mg by mouth 2 (two) times daily. )   triamcinolone cream (KENALOG) 0.1 % APPLY 1 APPLICATION TOPICALLY 2 TIMES DAILY.   TRUEPLUS LANCETS 28G MISC 28 g by Does not apply route QID.   umeclidinium bromide (INCRUSE ELLIPTA) 62.5 MCG/INH AEPB Inhale 1 puff into the lungs daily.   Vitamin E 180 MG CAPS Take 180 mg by mouth daily.   No facility-administered encounter medications on file as of 03/14/2020.    Observations/Objective: Patient does not sound to be in any acute respiratory distress.  She is talking in full sentences.  She is not gasping for air  Assessment and Plan: 1. COPD exacerbation (HCC) - predniSONE (DELTASONE) 20 MG tablet; Take 1 tablet (20 mg total) by mouth daily with breakfast.  Dispense: 5 tablet; Refill: 0 - amoxicillin-clavulanate (AUGMENTIN) 500-125 MG tablet; Take 1 tablet (500 mg total) by mouth 3 (three) times daily.  Dispense: 24 tablet; Refill: 0  2. Acute recurrent frontal sinusitis - predniSONE (DELTASONE) 20 MG tablet; Take 1 tablet (20 mg total) by mouth daily with breakfast.  Dispense: 5 tablet; Refill: 0 - amoxicillin-clavulanate (AUGMENTIN) 500-125 MG tablet; Take 1 tablet (500 mg total) by mouth 3 (three) times daily.  Dispense: 24 tablet; Refill: 0  Advised that her next visit be in person.  Follow Up Instructions: 6 wks   I  discussed the assessment and treatment plan with the patient. The patient was provided an opportunity to ask questions and all were answered. The patient agreed with the plan and demonstrated an understanding of the instructions.   The patient was advised to call back or seek an in-person evaluation if the symptoms worsen or if the condition fails to improve as anticipated.  I provided 10 minutes of non-face-to-face time during this encounter.   Karle Plumber, MD

## 2020-03-15 MED FILL — ?METFORMIN HCL 500MG TABL: 500 | 30 days supply | Qty: 60 | Fill #5

## 2020-03-23 MED FILL — NICOTINE 21 MG/24HR PATCH: 21 | 28 days supply | Qty: 28 | Fill #1

## 2020-03-24 MED FILL — AMLODIPINE BESYLATE 5 MG TA: 5 | 30 days supply | Qty: 30 | Fill #4

## 2020-04-03 ENCOUNTER — Ambulatory Visit (INDEPENDENT_AMBULATORY_CARE_PROVIDER_SITE_OTHER): Payer: HRSA Program | Admitting: Licensed Clinical Social Worker

## 2020-04-03 ENCOUNTER — Other Ambulatory Visit: Payer: Self-pay

## 2020-04-03 ENCOUNTER — Encounter (HOSPITAL_COMMUNITY): Payer: Self-pay | Admitting: Licensed Clinical Social Worker

## 2020-04-03 DIAGNOSIS — F411 Generalized anxiety disorder: Secondary | ICD-10-CM

## 2020-04-03 DIAGNOSIS — F32A Depression, unspecified: Secondary | ICD-10-CM

## 2020-04-03 DIAGNOSIS — F329 Major depressive disorder, single episode, unspecified: Secondary | ICD-10-CM
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
## 2020-04-04 ENCOUNTER — Other Ambulatory Visit: Payer: Self-pay | Admitting: Internal Medicine

## 2020-04-04 DIAGNOSIS — J302 Other seasonal allergic rhinitis: Secondary | ICD-10-CM

## 2020-04-04 DIAGNOSIS — J449 Chronic obstructive pulmonary disease, unspecified: Secondary | ICD-10-CM

## 2020-04-04 MED FILL — LOSARTAN POTASSIUM 50 MG TA: 50 | 30 days supply | Qty: 45 | Fill #2

## 2020-04-04 MED FILL — MONTELUKAST SOD 10 MG TAB: 10 | 30 days supply | Qty: 30 | Fill #1

## 2020-04-04 MED FILL — OMEPRAZOLE DR 40 MG CAPSULE: 40 | 30 days supply | Qty: 60 | Fill #1

## 2020-04-04 MED FILL — CYCLOBENZAPRINE 10 MG TAB: 10 | 10 days supply | Qty: 30 | Fill #1

## 2020-04-04 MED FILL — FUROSEMIDE 20 MG TABS: 20 | 30 days supply | Qty: 30 | Fill #1

## 2020-04-04 MED FILL — $VENTOLIN HFA 18G INHALER: 108 (90 BAS | 25 days supply | Qty: 18 | Fill #0

## 2020-04-17 ENCOUNTER — Other Ambulatory Visit: Payer: Self-pay | Admitting: Internal Medicine

## 2020-04-17 ENCOUNTER — Encounter (HOSPITAL_COMMUNITY): Payer: Self-pay | Admitting: Licensed Clinical Social Worker

## 2020-04-17 ENCOUNTER — Other Ambulatory Visit: Payer: Self-pay

## 2020-04-17 ENCOUNTER — Ambulatory Visit (INDEPENDENT_AMBULATORY_CARE_PROVIDER_SITE_OTHER): Payer: Self-pay | Admitting: Licensed Clinical Social Worker

## 2020-04-17 DIAGNOSIS — F411 Generalized anxiety disorder: Secondary | ICD-10-CM

## 2020-04-17 DIAGNOSIS — F32A Depression, unspecified: Secondary | ICD-10-CM

## 2020-04-17 MED FILL — $VENTOLIN HFA 18G INHALER: 108 (90 BAS | 25 days supply | Qty: 18 | Fill #1

## 2020-04-17 MED FILL — ATORVASTATIN CALCIUM 80 MG: 80 | 30 days supply | Qty: 30 | Fill #5

## 2020-04-17 MED FILL — $BREO ELLIPTA 200-25 MCG IN: 200-25 | 90 days supply | Qty: 180 | Fill #4

## 2020-04-17 MED FILL — $INCRUSE ELLIPTA 62.5 MCG I: 62.5 MCG | 60 days supply | Qty: 60 | Fill #3

## 2020-04-17 MED FILL — GABAPENTIN 600 MG TABLET: 600 | 30 days supply | Qty: 120 | Fill #2

## 2020-04-17 MED FILL — DOXEPIN HCL 50 MG CAPS: 50 | 30 days supply | Qty: 30 | Fill #1

## 2020-04-17 MED FILL — ?AMLODIPINE BESYLATE 5MG TA: 5 | 30 days supply | Qty: 30 | Fill #5

## 2020-04-17 NOTE — Telephone Encounter (Signed)
Requested medication (s) are due for refill today: yes  Requested medication (s) are on the active medication list: yes  Last refill:  02/10/20 #30 1 refill   Future visit scheduled: no  Notes to clinic:  not delegated per protocol     Requested Prescriptions  Pending Prescriptions Disp Refills   cyclobenzaprine (FLEXERIL) 10 MG tablet [Pharmacy Med Name: CYCLOBENZAPRINE 10 MG TAB 10 Tablet] 30 tablet 1    Sig: TAKE 1 TABLET BY MOUTH 3 TIMES DAILY AS NEEDED.      Not Delegated - Analgesics:  Muscle Relaxants Failed - 04/17/2020  1:42 PM      Failed - This refill cannot be delegated      Passed - Valid encounter within last 6 months    Recent Outpatient Visits           1 month ago COPD exacerbation [facility])   [place] Ladell Pier, MD   1 month ago COPD exacerbation [facility])   [place], [place], MD   2 months ago Pulmonary emphysema, unspecified emphysema type [facility])   [place] Elsie Stain, MD   2 months ago COPD exacerbation [facility])   [facility] Kerin Perna, NP   5 months ago COPD exacerbation [facility])   [place] [facility] Elsie Stain, MD

## 2020-04-17 NOTE — Progress Notes (Signed)
Virtual Visit via Phone Note  I connected with [name] [name] on 04/17/20 at 10:00 am EDT by phone enabled telemedicine application and verified that I am speaking with the correct person using two identifiers. Pt does not have a computer or internet.   I discussed the limitations of evaluation and management by telemedicine and the availability of in person appointments. The patient expressed understanding and agreed to proceed.  LOCATION: Patient: Home Provider: Home  History of Present Illness: Pt was referred to OP therapy by Psychiatrist, Dr. Casimiro Needle, for her anxiety, depression and Bipolar disorder.     Observations/Objective:  Pt presents anxious for her counseling session by phone-enabled telemedicine. Pt discussed her psychiatric symptoms and current life events.  "my daughter and I got into it again. She is mean to me." Used socratic questions and discussed options. Cln provided psychoeducation on boundaries. Practiced using boundaries in session.   Assessment and Plan: Counselor will continue to meet with patient to address treatment plan goals. Patient will continue to follow recommendations of providers and implement skills learned in session.    Follow Up Instructions:  Counselor discussed the assessment and treatment plan with the patient. The patient was provided an opportunity to ask questions and all were answered. The patient agreed with the plan and demonstrated an understanding of the instructions.   The patient was advised to call back or seek an in-person evaluation if the symptoms worsen or if the condition fails to improve as anticipated.  I provided  40 minutes of non-face-to-face time during this encounter.   Cyan Clippinger S, LCAS

## 2020-04-18 MED FILL — CYCLOBENZAPRINE 10 MG TAB: 10 | 30 days supply | Qty: 30 | Fill #0

## 2020-05-01 MED FILL — $BYSTOLIC 10 MG TABLET: 10 | 30 days supply | Qty: 30 | Fill #2

## 2020-05-03 ENCOUNTER — Ambulatory Visit (INDEPENDENT_AMBULATORY_CARE_PROVIDER_SITE_OTHER): Payer: Self-pay | Admitting: Licensed Clinical Social Worker

## 2020-05-03 ENCOUNTER — Encounter (HOSPITAL_COMMUNITY): Payer: Self-pay | Admitting: Licensed Clinical Social Worker

## 2020-05-03 ENCOUNTER — Other Ambulatory Visit: Payer: Self-pay

## 2020-05-03 DIAGNOSIS — F32A Depression, unspecified: Secondary | ICD-10-CM

## 2020-05-03 DIAGNOSIS — F411 Generalized anxiety disorder: Secondary | ICD-10-CM

## 2020-05-03 NOTE — Progress Notes (Signed)
Virtual Visit via Phone Note  I connected with [name] [name] on 05/03/20 at 10:00 am EDT by phone enabled telemedicine application and verified that I am speaking with the correct person using two identifiers. Pt does not have a computer or internet.   I discussed the limitations of evaluation and management by telemedicine and the availability of in person appointments. The patient expressed understanding and agreed to proceed.  LOCATION: Patient: Home Provider: Home  History of Present Illness: Pt was referred to OP therapy by Psychiatrist, Dr. Casimiro Needle, for her anxiety, depression and Bipolar disorder.     Observations/Objective:  Pt presents anxious for her counseling session by phone-enabled telemedicine. Pt discussed her psychiatric symptoms and current life events. Patient tearfully shared her x husband that she divorced over 17 years ago has contacted her to see his daughter and grandchildren. "I don't know how i'll feel when I see him." Used CBT to assist patient with identifying and feeling her emotions. "I'm confused." Cln assisted patient compartmentalizing her positive and negative feelings about the upcoming visit. Reminded patient how to use her boundaries.      Assessment and Plan: Counselor will continue to meet with patient to address treatment plan goals. Patient will continue to follow recommendations of providers and implement skills learned in session.    Follow Up Instructions:  Counselor discussed the assessment and treatment plan with the patient. The patient was provided an opportunity to ask questions and all were answered. The patient agreed with the plan and demonstrated an understanding of the instructions.   The patient was advised to call back or seek an in-person evaluation if the symptoms worsen or if the condition fails to improve as anticipated.  I provided  45 minutes of non-face-to-face time during this encounter.   Jhony Antrim S, LCAS

## 2020-05-04 ENCOUNTER — Other Ambulatory Visit: Payer: Self-pay | Admitting: Internal Medicine

## 2020-05-04 ENCOUNTER — Other Ambulatory Visit: Payer: Self-pay | Admitting: Physician Assistant

## 2020-05-04 DIAGNOSIS — E1165 Type 2 diabetes mellitus with hyperglycemia: Secondary | ICD-10-CM

## 2020-05-04 MED FILL — METFORMIN HCL 500 MG TABS: 500 | 30 days supply | Qty: 60 | Fill #0

## 2020-05-04 MED FILL — ?LEVOTHYROXINE SODIUM 100MC: 100 | 30 days supply | Qty: 30 | Fill #1

## 2020-05-04 MED FILL — FUROSEMIDE 20 MG TABS: 20 | 30 days supply | Qty: 30 | Fill #2

## 2020-05-10 ENCOUNTER — Encounter: Payer: Self-pay | Admitting: Internal Medicine

## 2020-05-16 ENCOUNTER — Other Ambulatory Visit: Payer: Self-pay | Admitting: Critical Care Medicine

## 2020-05-16 ENCOUNTER — Other Ambulatory Visit: Payer: Self-pay | Admitting: Internal Medicine

## 2020-05-16 DIAGNOSIS — I10 Essential (primary) hypertension: Secondary | ICD-10-CM

## 2020-05-16 DIAGNOSIS — K219 Gastro-esophageal reflux disease without esophagitis: Secondary | ICD-10-CM

## 2020-05-16 MED FILL — OMEPRAZOLE DR 40 MG CAPSULE: 40 | 30 days supply | Qty: 60 | Fill #0

## 2020-05-16 MED FILL — MONTELUKAST SOD 10 MG TAB: 10 | 30 days supply | Qty: 30 | Fill #2

## 2020-05-16 MED FILL — $BYSTOLIC 10 MG TABLET: 10 | 30 days supply | Qty: 30 | Fill #0

## 2020-05-16 NOTE — Telephone Encounter (Signed)
Requested Prescriptions  Pending Prescriptions Disp Refills   omeprazole (PRILOSEC) 40 MG capsule [Pharmacy Med Name: OMEPRAZOLE DR 40 MG CAPSULE 40 Capsule] 120 capsule 0    Sig: TAKE 1 CAPSULE (40 MG TOTAL) BY MOUTH 2 (TWO) TIMES DAILY BEFORE A MEAL.     Gastroenterology: Proton Pump Inhibitors Passed - 05/16/2020  3:55 PM      Passed - Valid encounter within last 12 months    Recent Outpatient Visits          2 months ago COPD exacerbation [facility])   [place] Ladell Pier, MD   2 months ago COPD exacerbation [facility])   [place], Enobong, MD   3 months ago Pulmonary emphysema, unspecified emphysema type [facility])   [place] Elsie Stain, MD   3 months ago COPD exacerbation [facility])   [facility] Kerin Perna, NP   6 months ago COPD exacerbation [facility])   [facility] Elsie Stain, MD              BYSTOLIC 10 MG tablet [Pharmacy Med Name: BYSTOLIC 10 MG TABLET 10 Tablet] 90 tablet 1    Sig: TAKE 1 TABLET (10 MG TOTAL) BY MOUTH DAILY.     Cardiovascular:  Beta Blockers Passed - 05/16/2020  3:55 PM      Passed - Last BP in normal range    BP Readings from Last 1 Encounters:  02/10/20 (!) 130/74         Passed - Last Heart Rate in normal range    Pulse Readings from Last 1 Encounters:  02/10/20 66         Passed - Valid encounter within last 6 months    Recent Outpatient Visits          2 months ago COPD exacerbation ([place])   [place] Ladell Pier, MD   2 months ago COPD exacerbation [facility])   [place], Enobong, MD   3 months ago Pulmonary emphysema, unspecified emphysema type [facility])   [place] Elsie Stain, MD   3 months ago COPD exacerbation [facility])   [facility] Kerin Perna, NP   6 months ago COPD exacerbation [facility])   [facility] Elsie Stain, MD

## 2020-05-16 NOTE — Telephone Encounter (Signed)
Requested Prescriptions  Pending Prescriptions Disp Refills  . losartan (COZAAR) 50 MG tablet [Pharmacy Med Name: LOSARTAN POTASSIUM 50 MG TA 50 Tablet] 45 tablet 0    Sig: Take 1.5 tablets by mouth daily.     Cardiovascular:  Angiotensin Receptor Blockers Failed - 05/16/2020  3:55 PM      Failed - Cr in normal range and within 180 days    Creat  Date Value Ref Range Status  04/16/2016 0.93 0.50 - 1.05 mg/dL Final    Comment:      For patients > or = 64 years of age: The upper reference limit for Creatinine is approximately 13% higher for people identified as African-American.      Creatinine, Ser  Date Value Ref Range Status  04/05/2019 0.92 0.57 - 1.00 mg/dL Final   Creatinine,U  Date Value Ref Range Status  01/11/2010 61.7 mg/dL Final    Comment:    See lab report for associated comment(s)         Failed - K in normal range and within 180 days    Potassium  Date Value Ref Range Status  04/05/2019 5.0 3.5 - 5.2 mmol/L Final         Passed - Patient is not pregnant      Passed - Last BP in normal range    BP Readings from Last 1 Encounters:  02/10/20 (!) 130/74         Passed - Valid encounter within last 6 months    Recent Outpatient Visits          2 months ago COPD exacerbation ([place])   [place] Ladell Pier, MD   2 months ago COPD exacerbation [facility])   [place], Enobong, MD   3 months ago Pulmonary emphysema, unspecified emphysema type [facility])   [place] Elsie Stain, MD   3 months ago COPD exacerbation [facility])   [facility] Kerin Perna, NP   6 months ago COPD exacerbation [facility])   [place], MD      Future Appointments            In 1 week Camillia Herter, NP [place]           . furosemide (LASIX) 20 MG tablet [Pharmacy Med  Name: FUROSEMIDE 20 MG TABS 20 Tablet] 30 tablet 0    Sig: TAKE 1 TABLET (20 MG TOTAL) BY MOUTH DAILY.     Cardiovascular:  Diuretics - Loop Failed - 05/16/2020  3:55 PM      Failed - K in normal range and within 360 days    Potassium  Date Value Ref Range Status  04/05/2019 5.0 3.5 - 5.2 mmol/L Final         Failed - Ca in normal range and within 360 days    Calcium  Date Value Ref Range Status  04/05/2019 9.6 8.7 - 10.3 mg/dL Final         Failed - Na in normal range and within 360 days    Sodium  Date Value Ref Range Status  04/05/2019 140 134 - 144 mmol/L Final         Failed - Cr in normal range and within 360 days    Creat  Date Value Ref Range Status  04/16/2016 0.93 0.50 - 1.05 mg/dL Final  Comment:      For patients > or = 64 years of age: The upper reference limit for Creatinine is approximately 13% higher for people identified as African-American.      Creatinine, Ser  Date Value Ref Range Status  04/05/2019 0.92 0.57 - 1.00 mg/dL Final   Creatinine,U  Date Value Ref Range Status  01/11/2010 61.7 mg/dL Final    Comment:    See lab report for associated comment(s)         Passed - Last BP in normal range    BP Readings from Last 1 Encounters:  02/10/20 (!) 130/74         Passed - Valid encounter within last 6 months    Recent Outpatient Visits          2 months ago COPD exacerbation ([place])   [place] Ladell Pier, MD   2 months ago COPD exacerbation [facility])   [place], Enobong, MD   3 months ago Pulmonary emphysema, unspecified emphysema type [facility])   [place] Elsie Stain, MD   3 months ago COPD exacerbation [facility])   [facility] Kerin Perna, NP   6 months ago COPD exacerbation [facility])   [place] Elsie Stain, MD      Future Appointments            In 1 week  Camillia Herter, NP [place]            Phone call to pt.  Scheduled for f/u appt. On 05/26/20. Gave 30 day courtesy refill at this time.

## 2020-05-17 ENCOUNTER — Encounter (HOSPITAL_COMMUNITY): Payer: Self-pay | Admitting: Licensed Clinical Social Worker

## 2020-05-17 ENCOUNTER — Other Ambulatory Visit: Payer: Self-pay

## 2020-05-17 ENCOUNTER — Ambulatory Visit (INDEPENDENT_AMBULATORY_CARE_PROVIDER_SITE_OTHER): Payer: Self-pay | Admitting: Licensed Clinical Social Worker

## 2020-05-17 DIAGNOSIS — F32A Depression, unspecified: Secondary | ICD-10-CM

## 2020-05-17 DIAGNOSIS — F411 Generalized anxiety disorder: Secondary | ICD-10-CM

## 2020-05-17 MED FILL — LOSARTAN POTASSIUM 50 MG TA: 50 | 30 days supply | Qty: 45 | Fill #0

## 2020-05-17 NOTE — Progress Notes (Signed)
Virtual Visit via Phone Note  I connected with [name] [name] on 05/17/20 at 10:00 am EDT by phone enabled telemedicine application and verified that I am speaking with the correct person using two identifiers. Pt does not have a computer or internet.   I discussed the limitations of evaluation and management by telemedicine and the availability of in person appointments. The patient expressed understanding and agreed to proceed.  LOCATION: Patient: Home Provider: Home  History of Present Illness: Pt was referred to OP therapy by Psychiatrist, Dr. Casimiro Needle, for her anxiety, depression and Bipolar disorder.     Observations/Objective:  Pt presents anxious for her counseling session by phone-enabled telemedicine. Pt discussed her psychiatric symptoms and current life events. Patient tearfully shared about the visit with her x husband that she divorced over 76 years ago. He came to see his daughter, grandsons, and patient. Patient described her fears of the visit, but it turned out well. Cln provided psychoeducation on fear. Patient was working today so she made the appointment short.           Assessment and Plan: Counselor will continue to meet with patient to address treatment plan goals. Patient will continue to follow recommendations of providers and implement skills learned in session.    Follow Up Instructions:  Counselor discussed the assessment and treatment plan with the patient. The patient was provided an opportunity to ask questions and all were answered. The patient agreed with the plan and demonstrated an understanding of the instructions.   The patient was advised to call back or seek an in-person evaluation if the symptoms worsen or if the condition fails to improve as anticipated.  I provided  30 minutes of non-face-to-face time during this encounter.   Joash Tony S, LCAS

## 2020-05-20 DIAGNOSIS — E039 Hypothyroidism, unspecified: Secondary | ICD-10-CM | POA: Insufficient documentation

## 2020-05-20 DIAGNOSIS — Z91040 Latex allergy status: Secondary | ICD-10-CM | POA: Insufficient documentation

## 2020-05-20 DIAGNOSIS — G8929 Other chronic pain: Secondary | ICD-10-CM | POA: Insufficient documentation

## 2020-05-20 DIAGNOSIS — Z7984 Long term (current) use of oral hypoglycemic drugs: Secondary | ICD-10-CM | POA: Insufficient documentation

## 2020-05-20 DIAGNOSIS — I2510 Atherosclerotic heart disease of native coronary artery without angina pectoris: Secondary | ICD-10-CM | POA: Insufficient documentation

## 2020-05-20 DIAGNOSIS — Z79899 Other long term (current) drug therapy: Secondary | ICD-10-CM | POA: Insufficient documentation

## 2020-05-20 DIAGNOSIS — Z955 Presence of coronary angioplasty implant and graft: Secondary | ICD-10-CM | POA: Insufficient documentation

## 2020-05-20 DIAGNOSIS — E119 Type 2 diabetes mellitus without complications: Secondary | ICD-10-CM | POA: Insufficient documentation

## 2020-05-20 DIAGNOSIS — M545 Low back pain, unspecified: Secondary | ICD-10-CM | POA: Insufficient documentation

## 2020-05-20 DIAGNOSIS — K921 Melena: Secondary | ICD-10-CM | POA: Insufficient documentation

## 2020-05-20 DIAGNOSIS — Z7982 Long term (current) use of aspirin: Secondary | ICD-10-CM | POA: Insufficient documentation

## 2020-05-20 DIAGNOSIS — J449 Chronic obstructive pulmonary disease, unspecified: Secondary | ICD-10-CM | POA: Insufficient documentation

## 2020-05-20 DIAGNOSIS — I10 Essential (primary) hypertension: Secondary | ICD-10-CM | POA: Insufficient documentation

## 2020-05-20 DIAGNOSIS — F17210 Nicotine dependence, cigarettes, uncomplicated: Secondary | ICD-10-CM | POA: Insufficient documentation

## 2020-05-20 DIAGNOSIS — Z7951 Long term (current) use of inhaled steroids: Secondary | ICD-10-CM | POA: Insufficient documentation

## 2020-05-21 ENCOUNTER — Emergency Department (HOSPITAL_COMMUNITY): Payer: Self-pay

## 2020-05-21 ENCOUNTER — Encounter (HOSPITAL_COMMUNITY): Payer: Self-pay | Admitting: Emergency Medicine

## 2020-05-21 ENCOUNTER — Other Ambulatory Visit: Payer: Self-pay

## 2020-05-21 ENCOUNTER — Other Ambulatory Visit (HOSPITAL_COMMUNITY): Payer: Self-pay | Admitting: Emergency Medicine

## 2020-05-21 ENCOUNTER — Emergency Department (HOSPITAL_COMMUNITY)
Admission: EM | Admit: 2020-05-21 | Discharge: 2020-05-21 | Disposition: A | Discharge status: Home / Self Care | Payer: Self-pay | Attending: Emergency Medicine | Admitting: Emergency Medicine

## 2020-05-21 DIAGNOSIS — K921 Melena: Secondary | ICD-10-CM

## 2020-05-21 DIAGNOSIS — M545 Low back pain, unspecified: Secondary | ICD-10-CM

## 2020-05-21 DIAGNOSIS — G8929 Other chronic pain: Secondary | ICD-10-CM

## 2020-05-21 LAB — COMPREHENSIVE METABOLIC PANEL
ALT: 18 U/L (ref 0–44)
AST: 15 U/L (ref 15–41)
Albumin: 3.6 g/dL (ref 3.5–5.0)
Alkaline Phosphatase: 87 U/L (ref 38–126)
Anion gap: 8 (ref 5–15)
BUN: 12 mg/dL (ref 8–23)
CO2: 27 mmol/L (ref 22–32)
Calcium: 9.4 mg/dL (ref 8.9–10.3)
Chloride: 105 mmol/L (ref 98–111)
Creatinine, Ser: 0.83 mg/dL (ref 0.44–1.00)
GFR, Estimated: >60 mL/min (ref >60)
Glucose, Bld: 84 mg/dL (ref 70–99)
Potassium: 4.2 mmol/L (ref 3.5–5.1)
Sodium: 140 mmol/L (ref 135–145)
Total Bilirubin: 0.6 mg/dL (ref 0.3–1.2)
Total Protein: 6.3 g/dL — ABNORMAL LOW (ref 6.5–8.1)

## 2020-05-21 LAB — CBC WITH DIFFERENTIAL/PLATELET
Abs Immature Granulocytes: 0.01 10*3/uL (ref 0.00–0.07)
Basophils Absolute: 0.1 10*3/uL (ref 0.0–0.1)
Basophils Relative: 1 %
Eosinophils Absolute: 0.4 10*3/uL (ref 0.0–0.5)
Eosinophils Relative: 4 %
HCT: 43.6 % (ref 36.0–46.0)
Hemoglobin: 14 g/dL (ref 12.0–15.0)
Immature Granulocytes: 0 %
Lymphocytes Relative: 31 %
Lymphs Abs: 2.7 10*3/uL (ref 0.7–4.0)
MCH: 31.2 pg (ref 26.0–34.0)
MCHC: 32.1 g/dL (ref 30.0–36.0)
MCV: 97.1 fL (ref 80.0–100.0)
Monocytes Absolute: 0.9 10*3/uL (ref 0.1–1.0)
Monocytes Relative: 11 %
Neutro Abs: 4.5 10*3/uL (ref 1.7–7.7)
Neutrophils Relative %: 53 %
Platelets: 419 10*3/uL — ABNORMAL HIGH (ref 150–400)
RBC: 4.49 MIL/uL (ref 3.87–5.11)
RDW: 14.7 % (ref 11.5–15.5)
WBC: 8.5 10*3/uL (ref 4.0–10.5)
nRBC: 0 % (ref 0.0–0.2)

## 2020-05-21 LAB — HEMOGLOBIN AND HEMATOCRIT, BLOOD
HCT: 46.9 % — ABNORMAL HIGH (ref 36.0–46.0)
Hemoglobin: 14.9 g/dL (ref 12.0–15.0)

## 2020-05-21 LAB — PROTIME-INR
INR: 0.9 (ref 0.8–1.2)
Prothrombin Time: 12.1 seconds (ref 11.4–15.2)

## 2020-05-21 LAB — POC OCCULT BLOOD, ED: Fecal Occult Bld: NEGATIVE

## 2020-05-21 MED ORDER — SODIUM CHLORIDE 0.9 % IV BOLUS
1000.0000 mL | Freq: Once | INTRAVENOUS | Status: AC
  Administered 2020-05-21: 1000 mL via INTRAVENOUS

## 2020-05-21 MED ORDER — HYDROCORTISONE (PERIANAL) 2.5 % EX CREA: 1.0000 "application " | TOPICAL_CREAM | Freq: Two times a day (BID) | CUTANEOUS | 0 refills | Status: DC

## 2020-05-21 NOTE — ED Provider Notes (Signed)
[place] EMERGENCY DEPARTMENT Provider Note   CSN: [id number] Arrival date & time: 05/20/20  2347     History Chief Complaint  Patient presents with  . Rectal Bleeding    [name] [name] is a 64 y.o. female.  Pt presents to the ED today with rectal bleeding.  Pt said she noticed some blood on the toilet paper last night.  Pt is on blood thinners.  She does not know which one.  Per chart review, she is on Brilinta.  She has had some constipation.  No rectal trauma.  Pt also c/o back pain.  She has a hx of chronic lbp.  She denies any abdominal pain.  No f/c.  Pt did admit to smoking some crack prior to coming in.        Past Medical History:  Diagnosis Date  . Anxiety    takes Lexapro daily  . Arthritis    "back, from neck down pass my bra area" (03/25/2017)  . Asthma   . Bartholin gland cyst 08/29/2011  . Bruises easily    pt is on Effient  . Chronic back pain    herniated nucleus pulposus  . Chronic back pain    "neck to bra area; lower back" (03/25/2017)  . COPD (chronic obstructive pulmonary disease) ([place])    early stages  . Coronary artery disease   . Depression    takes Klonopin daily  . Diabetes mellitus without complication ([place])   . Diverticulosis   . GERD (gastroesophageal reflux disease)    takes Nexium daily  . H/O hiatal hernia   . Heart attack ([place]) 2011  . Hemorrhoids   . Hernia   . Hyperlipidemia    takes Lipitor daily  . Hypertension    takes Losartan daily and Labetalol bid  . Hypothyroidism    takes Synthroid daily  . Insomnia    hydroxyzine prn  . Joint pain   . Pneumonia    "couple times" (03/25/2017)  . Pre-diabetes    "just found out 1 wk ago" (03/25/2017)  . Psoriasis    elbows,knees,back  . Shortness of breath    with exertion  . Slowing of urinary stream   . Stress incontinence     Patient Active Problem List   Diagnosis Date Noted  . Abdominal pain, epigastric 04/28/2019  . Esophageal dysphagia  04/28/2019  . Bipolar I disorder ([place]) 09/17/2018  . Insomnia 10/28/2017  . Pre-diabetes 05/28/2017  . QT prolongation 03/25/2017  . Encounter for screening mammogram for breast cancer 09/11/2015  . Psoriasis 06/22/2015  . COPD (chronic obstructive pulmonary disease) (HCC)GOLD C  05/18/2015  . Anxiety and depression 07/10/2013  . Essential hypertension, benign 06/07/2013  . Dyslipidemia 06/07/2013  . Asthma, chronic 06/07/2013  . Emphysema lung ([place]) 06/07/2013  . Chronic back pain 06/07/2013  . CAD S/P percutaneous coronary angioplasty 06/07/2013  . GERD (gastroesophageal reflux disease) 06/07/2013  . Breast lump on left side at 3 o'clock position 10/13/2012  . S/P abdominal hysterectomy and right salpingo-oophorectomy 08/29/2011  . COPD exacerbation ([place]) 09/15/2009  . TOBACCO ABUSE 07/17/2009  . Chronic rhinitis 07/17/2009  . Lung nodule < 6cm on CT 07/17/2009  . ALLERGY, FOOD 07/17/2009  . Hypothyroidism 12/22/2008    Past Surgical History:  Procedure Laterality Date  . ABDOMINAL EXPLORATION SURGERY  1977  . ABDOMINAL HYSTERECTOMY  1977   "left one of my ovaries"  . BACK SURGERY    . BIOPSY  05/27/2019   Procedure:  BIOPSY;  Surgeon: Daneil Dolin, MD;  [facility];  Service: Endoscopy;;  . COLONOSCOPY  2011   Dr. Ardis Hughs: Mild diverticulosis, descending diminutive colon polyp (not retrieved), next colonoscopy 10 years  . CORONARY ANGIOPLASTY WITH STENT PLACEMENT  2011 X2   "regular stents didn't work; had to go back in in ~ 1 month and put in medicated stents"  . DILATION AND CURETTAGE OF UTERUS    . ESOPHAGOGASTRODUODENOSCOPY    . ESOPHAGOGASTRODUODENOSCOPY (EGD) WITH PROPOFOL N/A 05/27/2019   normal esophagus, dilation, erosive gastropathy s/p biopsy, normal duodenum. Negative H.pylori.   Marland Kitchen LEFT HEART CATH AND CORONARY ANGIOGRAPHY N/A 03/26/2017   Procedure: LEFT HEART CATH AND CORONARY ANGIOGRAPHY;  Surgeon: Belva Crome, MD;  Location: [place] [place] CV  LAB;  Service: Cardiovascular;  Laterality: N/A;  . LUMBAR LAMINECTOMY/DECOMPRESSION MICRODISCECTOMY  08/05/2011   Procedure: LUMBAR LAMINECTOMY/DECOMPRESSION MICRODISCECTOMY;  Surgeon: Otilio Connors, MD;  Location: [place] NEURO ORS;  Service: Neurosurgery;  Laterality: Right;  Right Lumbar four-five extraforaminal discectomy  . MALONEY DILATION N/A 05/27/2019   Procedure: Venia Minks DILATION;  Surgeon: Daneil Dolin, MD;  Location: [facility];  Service: Endoscopy;  Laterality: N/A;  54  . TONSILLECTOMY     as a child     OB History    Gravida  1   Para  1   Term  1   Preterm  0   AB  0   Living  1     SAB  0   TAB  0   Ectopic  0   Multiple  0   Live Births              Family History  Problem Relation Age of Onset  . Heart disease Mother   . Hypertension Mother   . Stroke Mother   . Mental illness Mother   . Heart disease Father   . Hypertension Father   . Diabetes Father   . Cancer Maternal Aunt        breast  . Thyroid disease Paternal Aunt   . Heart disease Maternal Grandfather   . Anesthesia problems Daughter   . Cancer Paternal Grandmother        mouth  . Hypotension Neg Hx   . Malignant hyperthermia Neg Hx   . Pseudochol deficiency Neg Hx   . Colon cancer Neg Hx     Social History   Tobacco Use  . Smoking status: Current Every Day Smoker    Packs/day: 1.00    Years: 50.00    Pack years: 50.00    Types: Cigarettes  . Smokeless tobacco: Never Used  Vaping Use  . Vaping Use: Never used  Substance Use Topics  . Alcohol use: Yes    Comment: once a week  . Drug use: No    Types: Cocaine    Comment: has been years per pt    Home Medications Prior to Admission medications   Medication Sig Start Date End Date Taking? Authorizing Provider  albuterol (VENTOLIN HFA) 108 (90 Base) MCG/ACT inhaler INHALE 2 PUFFS INTO THE LUNGS EVERY 6 (SIX) HOURS AS NEEDED FOR WHEEZING OR SHORTNESS OF BREATH. Patient taking differently: Inhale 2 puffs into  the lungs every 6 (six) hours as needed for wheezing or shortness of breath. INHALE 2 PUFFS INTO THE LUNGS EVERY 6 (SIX) HOURS AS NEEDED FOR WHEEZING OR SHORTNESS OF BREATH. 04/04/20   Ladell Pier, MD  amLODipine (NORVASC) 5 MG tablet Take  1 tablet (5 mg total) by mouth daily. 11/08/19   Kroeger, Lorelee Cover., PA-C  amoxicillin-clavulanate (AUGMENTIN) 500-125 MG tablet Take 1 tablet (500 mg total) by mouth 3 (three) times daily. Patient not taking: Reported on 05/21/2020 03/14/20   Ladell Pier, MD  aspirin EC 81 MG tablet Take 81 mg by mouth daily.    [provider]  atorvastatin (LIPITOR) 80 MG tablet Take 1 tablet (80 mg total) by mouth daily. 11/08/19   Kroeger, Lorelee Cover., PA-C  azithromycin (ZITHROMAX) 250 MG tablet Take orally 2 tabs (500 mg) on day 1 then 1 tab (250 mg) on days 2-5 Patient not taking: Reported on 03/14/2020 02/22/20   Charlott Rakes, MD  Blood Glucose Monitoring Suppl (TRUE METRIX METER) DEVI 1 kit by Does not apply route 4 (four) times daily. 04/04/17   Brayton Caves, PA-C  busPIRone (BUSPAR) 15 MG tablet 2  qam   1  q noon  And 1 qhs 02/18/20   Plovsky, Berneta Sages, MD  BYSTOLIC 10 MG tablet TAKE 1 TABLET (10 MG TOTAL) BY MOUTH DAILY. 05/16/20   Ladell Pier, MD  cetirizine (ZYRTEC) 10 MG tablet Take 10 mg by mouth daily.    [provider]  Cholecalciferol (VITAMIN D) 50 MCG (2000 UT) CAPS Take 2,000 Units by mouth daily.    [provider]  cyclobenzaprine (FLEXERIL) 10 MG tablet Take 1 tablet (10 mg total) by mouth daily as needed. 04/17/20   Ladell Pier, MD  doxepin (SINEQUAN) 50 MG capsule Take 1 capsule (50 mg total) by mouth at bedtime. 02/18/20 02/17/21  Plovsky, Berneta Sages, MD  escitalopram (LEXAPRO) 10 MG tablet Take 3 tablets (30 mg total) by mouth daily. 02/18/20   Plovsky, Berneta Sages, MD  fluticasone furoate-vilanterol (BREO ELLIPTA) 200-25 MCG/INH AEPB Inhale 1 puff into the lungs daily. 11/18/19   Elsie Stain, MD  furosemide (LASIX)  20 MG tablet TAKE 1 TABLET (20 MG TOTAL) BY MOUTH DAILY. 05/16/20   Ladell Pier, MD  gabapentin (NEURONTIN) 600 MG tablet TAKE 1 TABLET BY MOUTH 4 TIMES DAILY 02/11/20   Ladell Pier, MD  glucose blood (TRUE METRIX BLOOD GLUCOSE TEST) test strip Use as instructed 01/21/18   Ladell Pier, MD  hydrocortisone (ANUSOL-HC) 2.5 % rectal cream Place 1 application rectally 2 (two) times daily. 05/21/20   Isla Pence, MD  ipratropium-albuterol (DUONEB) 0.5-2.5 (3) MG/3ML SOLN TAKE 3 MLS VIA NEBULIZATION EVERY 6 HOURS 11/18/19   Elsie Stain, MD  levothyroxine (SYNTHROID) 100 MCG tablet TAKE 1 TABLET (100 MCG TOTAL) BY MOUTH DAILY. 02/29/20   Ladell Pier, MD  losartan (COZAAR) 50 MG tablet Take 1.5 tablets by mouth daily. 05/16/20   Ladell Pier, MD  metFORMIN (GLUCOPHAGE) 500 MG tablet TAKE 1 TABLET (500 MG TOTAL) BY MOUTH 2 (TWO) TIMES DAILY WITH A MEAL. 05/04/20   Ladell Pier, MD  mirtazapine (REMERON) 15 MG tablet Take 2 tablets (30 mg total) by mouth at bedtime. 02/18/20 02/17/21  Plovsky, Berneta Sages, MD  montelukast (SINGULAIR) 10 MG tablet TAKE 1 TABLET BY MOUTH AT BEDTIME. 02/11/20   Ladell Pier, MD  nitroGLYCERIN (NITROSTAT) 0.4 MG SL tablet Place 1 tablet (0.4 mg total) under the tongue every 5 (five) minutes as needed for chest pain. 11/08/19 02/06/20  Kroeger, Lorelee Cover., PA-C  omeprazole (PRILOSEC) 40 MG capsule TAKE 1 CAPSULE (40 MG TOTAL) BY MOUTH 2 (TWO) TIMES DAILY BEFORE A MEAL. 05/16/20   Ladell Pier, MD  predniSONE (DELTASONE) 20 MG tablet Take 2 tablets (40 mg total) by mouth daily with breakfast. Patient not taking: Reported on 03/14/2020 02/22/20   Charlott Rakes, MD  predniSONE (DELTASONE) 20 MG tablet Take 1 tablet (20 mg total) by mouth daily with breakfast. 03/14/20   Ladell Pier, MD  ticagrelor (BRILINTA) 90 MG TABS tablet TAKE 1 TABLET BY MOUTH 2 (TWO) TIMES DAILY. Patient taking differently: Take 90 mg by mouth 2 (two) times daily.   03/25/19   Argentina Donovan, PA-C  triamcinolone cream (KENALOG) 0.1 % APPLY 1 APPLICATION TOPICALLY 2 TIMES DAILY. 06/19/18   Ladell Pier, MD  TRUEPLUS LANCETS 28G MISC 28 g by Does not apply route QID. 04/04/17   Brayton Caves, PA-C  umeclidinium bromide (INCRUSE ELLIPTA) 62.5 MCG/INH AEPB Inhale 1 puff into the lungs daily. 11/18/19   Elsie Stain, MD  Vitamin E 180 MG CAPS Take 180 mg by mouth daily.    [provider]    Allergies    Avocado, Latex, and Codeine  Review of Systems   Review of Systems  Gastrointestinal: Positive for blood in stool.  All other systems reviewed and are negative.   Physical Exam Updated Vital Signs BP (!) 142/68 (BP Location: Right Arm)   Pulse (!) 58   Temp 97.6 F (36.4 C) (Oral)   Resp 17   Ht _0  (1.549 m)   Wt 65.5 kg   SpO2 95%   BMI 27.28 kg/m   Physical Exam Vitals and nursing note reviewed. Exam conducted with a chaperone present.  Constitutional:      Appearance: Normal appearance.  HENT:     Head: Normocephalic and atraumatic.     Right Ear: External ear normal.     Left Ear: External ear normal.     Nose: Nose normal.     Mouth/Throat:     Mouth: Mucous membranes are moist.     Pharynx: Oropharynx is clear.  Eyes:     Extraocular Movements: Extraocular movements intact.     Conjunctiva/sclera: Conjunctivae normal.     Pupils: Pupils are equal, round, and reactive to light.  Cardiovascular:     Rate and Rhythm: Normal rate and regular rhythm.     Pulses: Normal pulses.     Heart sounds: Normal heart sounds.  Pulmonary:     Effort: Pulmonary effort is normal.     Breath sounds: Wheezing present.  Abdominal:     General: Abdomen is flat. Bowel sounds are normal.     Palpations: Abdomen is soft.  Genitourinary:    Rectum: Guaiac result negative.  Musculoskeletal:        General: Normal range of motion.     Cervical back: Normal range of motion and neck supple.  Skin:    General: Skin is warm.       Capillary Refill: Capillary refill takes less than 2 seconds.  Neurological:     General: No focal deficit present.     Mental Status: She is alert and oriented to person, place, and time.  Psychiatric:        Mood and Affect: Mood normal.        Behavior: Behavior normal.     ED Results / Procedures / Treatments   Labs (all labs ordered are listed, but only abnormal results are displayed) Labs Reviewed  CBC WITH DIFFERENTIAL/PLATELET - Abnormal; Notable for the following components:      Result Value   Platelets 419 (*)  All other components within normal limits  COMPREHENSIVE METABOLIC PANEL - Abnormal; Notable for the following components:   Total Protein 6.3 (*)    All other components within normal limits  HEMOGLOBIN AND HEMATOCRIT, BLOOD - Abnormal; Notable for the following components:   HCT 46.9 (*)    All other components within normal limits  PROTIME-INR  URINALYSIS, ROUTINE W REFLEX MICROSCOPIC  RAPID URINE DRUG SCREEN, HOSP PERFORMED  POC OCCULT BLOOD, ED    EKG None  Radiology DG Chest 2 View  Result Date: 05/21/2020 CLINICAL DATA:  GI bleed. EXAM: CHEST - 2 VIEW COMPARISON:  12/09/2018 FINDINGS: Heart size appears within normal limits. Decreased lung volumes with mild bibasilar atelectasis or scar. No pleural effusion or edema. No airspace opacities identified. IMPRESSION: Low lung volumes with mild bibasilar atelectasis versus scar. Electronically Signed   By: Kerby Moors M.D.   On: 05/21/2020 06:13    Procedures Procedures (including critical care time)  Medications Ordered in ED Medications  sodium chloride 0.9 % bolus 1,000 mL (1,000 mLs Intravenous New Bag/Given 05/21/20 0425)    ED Course  I have reviewed the triage vital signs and the nursing notes.  Pertinent labs & imaging results that were available during my care of the patient were reviewed by me and considered in my medical decision making (see chart for details).    MDM  Rules/Calculators/A&P                          H/h stable.  Guaiac negative.  BP is normal. Pt has been constipated, so it was possibly from an internal hemorrhoid or small fissure.  Pt will be d/c home with instructions to f/u with GI.  Pt encouraged to avoid using cocaine.  Final Clinical Impression(s) / ED Diagnoses Final diagnoses:  Hematochezia  Chronic bilateral low back pain without sciatica    Rx / DC Orders ED Discharge Orders         Ordered    hydrocortisone (ANUSOL-HC) 2.5 % rectal cream  2 times daily        05/21/20 0529           Isla Pence, MD 05/21/20 [phone]

## 2020-05-21 NOTE — ED Triage Notes (Addendum)
  Patient BIB EMS for bloody stools and lower back pain.  Patient states she went to the bathroom and saw blood in her stool.  States this has never happened before and was recently told she needed a colonoscopy.  Patient states she smoked some crack cocaine before coming in for pain control.  Pain 8/10 and sharp.  Patient is on blood thinners

## 2020-05-21 NOTE — Discharge Instructions (Addendum)
Eat a high fiber diet.  Avoid using cocaine.

## 2020-05-22 MED FILL — PROCTOZONE-HC 2.5 % CREA: 2.5 | 15 days supply | Qty: 30 | Fill #0

## 2020-05-22 MED FILL — $VENTOLIN HFA 18G INHALER: 108 (90 BAS | 25 days supply | Qty: 18 | Fill #2

## 2020-05-24 ENCOUNTER — Ambulatory Visit (INDEPENDENT_AMBULATORY_CARE_PROVIDER_SITE_OTHER): Payer: Self-pay | Admitting: Licensed Clinical Social Worker

## 2020-05-24 ENCOUNTER — Other Ambulatory Visit: Payer: Self-pay

## 2020-05-24 ENCOUNTER — Encounter (HOSPITAL_COMMUNITY): Payer: Self-pay | Admitting: Licensed Clinical Social Worker

## 2020-05-24 DIAGNOSIS — F32A Depression, unspecified: Secondary | ICD-10-CM

## 2020-05-24 DIAGNOSIS — F411 Generalized anxiety disorder: Secondary | ICD-10-CM
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
## 2020-05-26 ENCOUNTER — Ambulatory Visit: Payer: Medicaid Other | Admitting: Family

## 2020-05-31 ENCOUNTER — Encounter (HOSPITAL_COMMUNITY): Payer: Self-pay | Admitting: Licensed Clinical Social Worker

## 2020-05-31 ENCOUNTER — Ambulatory Visit (INDEPENDENT_AMBULATORY_CARE_PROVIDER_SITE_OTHER): Payer: Self-pay | Admitting: Licensed Clinical Social Worker

## 2020-05-31 ENCOUNTER — Other Ambulatory Visit: Payer: Self-pay

## 2020-05-31 DIAGNOSIS — F32A Depression, unspecified: Secondary | ICD-10-CM

## 2020-05-31 DIAGNOSIS — F411 Generalized anxiety disorder: Secondary | ICD-10-CM
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
## 2020-06-06 ENCOUNTER — Ambulatory Visit (HOSPITAL_COMMUNITY): Payer: Self-pay | Admitting: Licensed Clinical Social Worker

## 2020-06-06 ENCOUNTER — Encounter (HOSPITAL_COMMUNITY): Payer: Self-pay | Admitting: Licensed Clinical Social Worker

## 2020-06-06 ENCOUNTER — Telehealth (HOSPITAL_COMMUNITY): Payer: Self-pay | Admitting: Licensed Clinical Social Worker

## 2020-06-06 ENCOUNTER — Other Ambulatory Visit: Payer: Self-pay

## 2020-06-06 DIAGNOSIS — F32A Depression, unspecified: Secondary | ICD-10-CM

## 2020-06-06 DIAGNOSIS — F411 Generalized anxiety disorder: Secondary | ICD-10-CM

## 2020-06-06 NOTE — Progress Notes (Signed)
Virtual Visit via Phone Note  I connected with [name] [name] on 06/06/20 at 10:00 am EST by phone enabled telemedicine application and verified that I am speaking with the correct person using two identifiers. Pt does not have a computer or internet.   I discussed the limitations of evaluation and management by telemedicine and the availability of in person appointments. The patient expressed understanding and agreed to proceed.  LOCATION: Patient: Home Provider: Home Office  History of Present Illness: Pt was referred to OP therapy by Psychiatrist, Dr. Casimiro Needle, for her anxiety, depression and Bipolar disorder.     Observations/Objective:  Pt presents sick with upper respitory infection for her counseling session by phone-enabled telemedicine. Pt discussed her psychiatric symptoms and current life events. Patient reports the front desk from [facility] called her this morning to tell her she can no longer be seen at [facility] due to lack of insurance. Patient was sad and upset. Helped patient process her feelings. Cln explained the process of transitioning to the new [facility] facility where she will have a new therapist and a new psychiatrist. Pt reports she has not used alcohol or drugs since last reported.           Assessment and Plan: Pt will transition to [facility] for new therapist and psychiatrist.      Follow Up Instructions:  Counselor discussed the assessment and treatment plan with the patient. The patient was provided an opportunity to ask questions and all were answered. The patient agreed with the plan and demonstrated an understanding of the instructions.   The patient was advised to call back or seek an in-person evaluation if the symptoms worsen or if the condition fails to improve as anticipated.  I provided  45 minutes of non-face-to-face time during this encounter.   Arieh Bogue S, LCAS

## 2020-06-06 NOTE — Telephone Encounter (Signed)
I spoke with this patient because when I was checking her in this morning and noticed that she has no insurance on file.  I called the patient 10:48am 06/06/20 - patient stated that she no insurance and she only working 2-days a week.  I informed the patient about [facility] for services.  Patient stated that she wasn't feeling well and asked that I call her back another day.  Beth and Dr. Casimiro Needle please advise.

## 2020-06-13 ENCOUNTER — Other Ambulatory Visit: Payer: Self-pay | Admitting: Internal Medicine

## 2020-06-13 DIAGNOSIS — I10 Essential (primary) hypertension: Secondary | ICD-10-CM

## 2020-06-13 MED FILL — ?LOSARTAN POTASSIUM 50 MG T: 50 | 30 days supply | Qty: 45 | Fill #0

## 2020-06-13 MED FILL — GABAPENTIN 600 MG TABLET: 600 | 30 days supply | Qty: 120 | Fill #3

## 2020-06-13 MED FILL — FUROSEMIDE 20 MG TABS: 20 | 30 days supply | Qty: 30 | Fill #0

## 2020-06-13 MED FILL — DOXEPIN 50 MG CAPSULE: 50 | 30 days supply | Qty: 30 | Fill #2

## 2020-06-13 MED FILL — CYCLOBENZAPRINE 10 MG TAB: 10 | 30 days supply | Qty: 30 | Fill #1

## 2020-06-13 MED FILL — ATORVASTATIN CALCIUM 80 MG: 80 | 30 days supply | Qty: 30 | Fill #6

## 2020-06-13 MED FILL — ESCITALOPRAM 10 MG TABLET: 10 | 10 days supply | Qty: 30 | Fill #3

## 2020-06-13 MED FILL — LEVOTHYROXINE SODIUM 100 MC: 100 | 30 days supply | Qty: 30 | Fill #2

## 2020-06-14 ENCOUNTER — Ambulatory Visit (HOSPITAL_COMMUNITY): Payer: Self-pay | Admitting: Licensed Clinical Social Worker

## 2020-06-15 ENCOUNTER — Other Ambulatory Visit: Payer: Self-pay | Admitting: Internal Medicine

## 2020-06-15 ENCOUNTER — Other Ambulatory Visit: Payer: Self-pay

## 2020-06-15 ENCOUNTER — Encounter: Payer: Self-pay | Admitting: Internal Medicine

## 2020-06-15 ENCOUNTER — Ambulatory Visit: Payer: Self-pay | Attending: Internal Medicine | Admitting: Internal Medicine

## 2020-06-15 VITALS — BP 148/70 | HR 70 | Temp 98.0°F | Resp 16 | Ht 61.5 in | Wt 136.6 lb

## 2020-06-15 DIAGNOSIS — J438 Other emphysema: Secondary | ICD-10-CM

## 2020-06-15 DIAGNOSIS — Z1231 Encounter for screening mammogram for malignant neoplasm of breast: Secondary | ICD-10-CM

## 2020-06-15 DIAGNOSIS — K219 Gastro-esophageal reflux disease without esophagitis: Secondary | ICD-10-CM | POA: Insufficient documentation

## 2020-06-15 DIAGNOSIS — E039 Hypothyroidism, unspecified: Secondary | ICD-10-CM

## 2020-06-15 DIAGNOSIS — Z7984 Long term (current) use of oral hypoglycemic drugs: Secondary | ICD-10-CM | POA: Insufficient documentation

## 2020-06-15 DIAGNOSIS — R7303 Prediabetes: Secondary | ICD-10-CM | POA: Insufficient documentation

## 2020-06-15 DIAGNOSIS — R300 Dysuria: Secondary | ICD-10-CM | POA: Insufficient documentation

## 2020-06-15 DIAGNOSIS — I25118 Atherosclerotic heart disease of native coronary artery with other forms of angina pectoris: Secondary | ICD-10-CM

## 2020-06-15 DIAGNOSIS — Z23 Encounter for immunization: Secondary | ICD-10-CM | POA: Insufficient documentation

## 2020-06-15 DIAGNOSIS — I2510 Atherosclerotic heart disease of native coronary artery without angina pectoris: Secondary | ICD-10-CM | POA: Insufficient documentation

## 2020-06-15 DIAGNOSIS — F32A Depression, unspecified: Secondary | ICD-10-CM

## 2020-06-15 DIAGNOSIS — E1165 Type 2 diabetes mellitus with hyperglycemia: Secondary | ICD-10-CM

## 2020-06-15 DIAGNOSIS — Z1211 Encounter for screening for malignant neoplasm of colon: Secondary | ICD-10-CM

## 2020-06-15 DIAGNOSIS — F419 Anxiety disorder, unspecified: Secondary | ICD-10-CM | POA: Insufficient documentation

## 2020-06-15 DIAGNOSIS — J439 Emphysema, unspecified: Secondary | ICD-10-CM | POA: Insufficient documentation

## 2020-06-15 DIAGNOSIS — F17200 Nicotine dependence, unspecified, uncomplicated: Secondary | ICD-10-CM | POA: Insufficient documentation

## 2020-06-15 DIAGNOSIS — E038 Other specified hypothyroidism: Secondary | ICD-10-CM | POA: Insufficient documentation

## 2020-06-15 DIAGNOSIS — Z79899 Other long term (current) drug therapy: Secondary | ICD-10-CM | POA: Insufficient documentation

## 2020-06-15 DIAGNOSIS — I10 Essential (primary) hypertension: Secondary | ICD-10-CM | POA: Insufficient documentation

## 2020-06-15 DIAGNOSIS — F329 Major depressive disorder, single episode, unspecified: Secondary | ICD-10-CM | POA: Insufficient documentation

## 2020-06-15 DIAGNOSIS — Z79890 Hormone replacement therapy (postmenopausal): Secondary | ICD-10-CM | POA: Insufficient documentation

## 2020-06-15 LAB — POCT GLYCOSYLATED HEMOGLOBIN (HGB A1C): HbA1c, POC (prediabetic range): 6 % (ref 5.7–6.4)

## 2020-06-15 LAB — POCT URINALYSIS DIP (CLINITEK)
Glucose, UA: NEGATIVE mg/dL
Ketones, POC UA: NEGATIVE mg/dL
Nitrite, UA: POSITIVE — AB
POC PROTEIN,UA: NEGATIVE
Spec Grav, UA: 1.025 (ref 1.010–1.025)
Urobilinogen, UA: 0.2 E.U./dL
pH, UA: 6 (ref 5.0–8.0)

## 2020-06-15 LAB — GLUCOSE, POCT (MANUAL RESULT ENTRY): POC Glucose: 94 mg/dl (ref 70–99)

## 2020-06-15 MED ORDER — ATORVASTATIN CALCIUM 80 MG PO TABS: 80.0000 mg | ORAL_TABLET | Freq: Every day | ORAL | 6 refills | Status: DC

## 2020-06-15 MED ORDER — NEBIVOLOL HCL 10 MG PO TABS: ORAL_TABLET | ORAL | 4 refills | Status: DC

## 2020-06-15 MED ORDER — CYCLOBENZAPRINE HCL 10 MG PO TABS
10.0000 mg | ORAL_TABLET | Freq: Every day | ORAL | 4 refills | Status: DC | PRN
Start: 2020-06-15 — End: 2020-06-15

## 2020-06-15 MED ORDER — CIPROFLOXACIN HCL 500 MG PO TABS: 500.0000 mg | ORAL_TABLET | Freq: Two times a day (BID) | ORAL | 0 refills | Status: DC

## 2020-06-15 MED ORDER — LOSARTAN POTASSIUM 50 MG PO TABS: 75.0000 mg | ORAL_TABLET | Freq: Every day | ORAL | 6 refills | Status: DC

## 2020-06-15 MED ORDER — LEVOTHYROXINE SODIUM 100 MCG PO TABS: 100.0000 ug | ORAL_TABLET | Freq: Every day | ORAL | 6 refills | Status: DC

## 2020-06-15 MED ORDER — OMEPRAZOLE 40 MG PO CPDR: 40.0000 mg | DELAYED_RELEASE_CAPSULE | Freq: Two times a day (BID) | ORAL | 0 refills | Status: DC

## 2020-06-15 MED ORDER — AMLODIPINE BESYLATE 5 MG PO TABS: 5.0000 mg | ORAL_TABLET | Freq: Every day | ORAL | 3 refills | Status: DC

## 2020-06-15 MED ORDER — TICAGRELOR 60 MG PO TABS: 60.0000 mg | ORAL_TABLET | Freq: Two times a day (BID) | ORAL | 5 refills | Status: DC

## 2020-06-15 MED ORDER — FUROSEMIDE 20 MG PO TABS
20.0000 mg | ORAL_TABLET | Freq: Every day | ORAL | 6 refills | Status: DC
  Filled 2020-12-15: qty 30, 30d supply, fill #0
  Filled 2021-01-22: qty 30, 30d supply, fill #1
  Filled 2021-03-08: qty 30, 30d supply, fill #2
  Filled 2021-05-02: qty 30, 30d supply, fill #3
  Filled 2021-06-10: qty 30, 30d supply, fill #4

## 2020-06-15 MED FILL — $BYSTOLIC 10 MG TABLET: 10 | 30 days supply | Qty: 30 | Fill #0

## 2020-06-15 MED FILL — $BRILINTA 60 MG TABLET: 60 | 90 days supply | Qty: 180 | Fill #0

## 2020-06-15 MED FILL — OMEPRAZOLE DR 40 MG CAPSULE: 40 | 30 days supply | Qty: 60 | Fill #0

## 2020-06-15 MED FILL — CIPROFLOXACIN HCL 500 MG TA: 500 | 5 days supply | Qty: 10 | Fill #0

## 2020-06-15 MED FILL — LOSARTAN POTASSIUM 50 MG TA: 50 | 30 days supply | Qty: 45 | Fill #0

## 2020-06-15 MED FILL — AMLODIPINE BESYLATE 5 MG TA: 5 | 30 days supply | Qty: 30 | Fill #0

## 2020-06-15 NOTE — Patient Instructions (Signed)
Influenza Virus Vaccine injection (Fluarix) What is this medicine? INFLUENZA VIRUS VACCINE (in floo EN zuh VAHY ruhs vak SEEN) helps to reduce the risk of getting influenza also known as the flu. This medicine may be used for other purposes; ask your health care provider or pharmacist if you have questions. COMMON BRAND NAME(S): Fluarix, Fluzone What should I tell my health care provider before I take this medicine? They need to know if you have any of these conditions:  bleeding disorder like hemophilia  fever or infection  Guillain-Barre syndrome or other neurological problems  immune system problems  infection with the human immunodeficiency virus (HIV) or AIDS  low blood platelet counts  multiple sclerosis  an unusual or allergic reaction to influenza virus vaccine, eggs, chicken proteins, latex, gentamicin, other medicines, foods, dyes or preservatives  pregnant or trying to get pregnant  breast-feeding How should I use this medicine? This vaccine is for injection into a muscle. It is given by a health care professional. A copy of Vaccine Information Statements will be given before each vaccination. Read this sheet carefully each time. The sheet may change frequently. Talk to your pediatrician regarding the use of this medicine in children. Special care may be needed. Overdosage: If you think you have taken too much of this medicine contact a poison control center or emergency room at once. NOTE: This medicine is only for you. Do not share this medicine with others. What if I miss a dose? This does not apply. What may interact with this medicine?  chemotherapy or radiation therapy  medicines that lower your immune system like etanercept, anakinra, infliximab, and adalimumab  medicines that treat or prevent blood clots like warfarin  phenytoin  steroid medicines like prednisone or cortisone  theophylline  vaccines This list may not describe all possible  interactions. Give your health care provider a list of all the medicines, herbs, non-prescription drugs, or dietary supplements you use. Also tell them if you smoke, drink alcohol, or use illegal drugs. Some items may interact with your medicine. What should I watch for while using this medicine? Report any side effects that do not go away within 3 days to your doctor or health care professional. Call your health care provider if any unusual symptoms occur within 6 weeks of receiving this vaccine. You may still catch the flu, but the illness is not usually as bad. You cannot get the flu from the vaccine. The vaccine will not protect against colds or other illnesses that may cause fever. The vaccine is needed every year. What side effects may I notice from receiving this medicine? Side effects that you should report to your doctor or health care professional as soon as possible:  allergic reactions like skin rash, itching or hives, swelling of the face, lips, or tongue Side effects that usually do not require medical attention (report to your doctor or health care professional if they continue or are bothersome):  fever  headache  muscle aches and pains  pain, tenderness, redness, or swelling at site where injected  weak or tired This list may not describe all possible side effects. Call your doctor for medical advice about side effects. You may report side effects to FDA at 1-800-FDA-1088. Where should I keep my medicine? This vaccine is only given in a clinic, pharmacy, doctor's office, or other health care setting and will not be stored at home. NOTE: This sheet is a summary. It may not cover all possible information. If you have questions   about this medicine, talk to your doctor, pharmacist, or health care provider.  2020 Elsevier/Gold Standard (2008-01-27 09:30:40)  

## 2020-06-15 NOTE — Progress Notes (Signed)
Patient ID: [name] [name], female    DOB: 1955-10-19  MRN: [id number]  CC: Urinary Tract Infection (possible ), COPD, Diabetes, and Hypertension   Subjective: [name] [name] is a 64 y.o. female who presents for chronic ds management Her concerns today include:  Patient with history ofpre-DM, GERD, hypothyroidism, HTN, HL, CAD with previous stent, COPD, substance abuse, depression/anxietyand tobacco dependence.    HM:  Due for MMG and c-scope. Due for flu shot  COPD:  Congested and rhinorrhea.  Out of Flonase nasal spray.  Using an OTC sinus med.  Out of Singulair and Zyrtec. Compliant with Breo and Incruse.  Feels she is okay on current inhalers meds.   Still smoking 1/2-1 pk a day.  Wants to quit but still smokes with patches on  HYPERTENSION/CAD Currently taking: see medication list Med Adherence: _0  Yes    _1  No Medication side effects: _2  Yes    _3  No Adherence with salt restriction: _4  Yes    _5  No Home Monitoring?: _6  Yes    _7  No Monitoring Frequency: _8  Yes    _9  No Home BP results range: _10  Yes    _11  No SOB? _12  Yes with significant exertion Chest Pain?: _13  Yes -occasional pressure but does not take SL Nitro Leg swelling?: _14  Yes    _15  No Headaches?: _16  Yes    _17  No Dizziness? _18  Yes    _19  No Comments: Saw her cardiologist in July.  He did mention changing her to Brilinta 60 mg twice a day from 90 mg.  Pre-DM:  Does not take Metformin every day.  She forgets wgh down 13 lbs over 1 yr.  She has been trying to loss wgh.  States that she cut back on eating white carbohydrates and feels that she is eating more responsibly. Walks up and down 24 steps to her apartment every day.  She feels better with the weight loss and wants to lose a little bit more.  Anx/Dep: was seeing Dr. Wadie Lessen but states she was told that her psychiatrist and therapist will be changed..  She has already been called with appointment for the new therapist.  Reports she is doing okay on her  current medications  Complains of dysuria times several days.  Would like to be checked for UTI. Patient Active Problem List   Diagnosis Date Noted  . Influenza vaccine needed 06/15/2020  . Abdominal pain, epigastric 04/28/2019  . Esophageal dysphagia 04/28/2019  . Bipolar I disorder ([place]) 09/17/2018  . Insomnia 10/28/2017  . Pre-diabetes 05/28/2017  . QT prolongation 03/25/2017  . Encounter for screening mammogram for breast cancer 09/11/2015  . Psoriasis 06/22/2015  . COPD (chronic obstructive pulmonary disease) (HCC)GOLD C  05/18/2015  . Anxiety and depression 07/10/2013  . Essential hypertension, benign 06/07/2013  . Dyslipidemia 06/07/2013  . Asthma, chronic 06/07/2013  . Emphysema lung ([place]) 06/07/2013  . Chronic back pain 06/07/2013  . CAD S/P percutaneous coronary angioplasty 06/07/2013  . GERD (gastroesophageal reflux disease) 06/07/2013  . Breast lump on left side at 3 o'clock position 10/13/2012  . S/P abdominal hysterectomy and right salpingo-oophorectomy 08/29/2011  . COPD exacerbation ([place]) 09/15/2009  . TOBACCO ABUSE 07/17/2009  . Chronic rhinitis 07/17/2009  . Lung nodule < 6cm on CT 07/17/2009  . ALLERGY, FOOD 07/17/2009  . Hypothyroidism 12/22/2008     Current Outpatient Medications on File Prior to Visit  Medication Sig Dispense Refill  . albuterol (VENTOLIN HFA) 108 (90 Base) MCG/ACT inhaler INHALE 2 PUFFS  INTO THE LUNGS EVERY 6 (SIX) HOURS AS NEEDED FOR WHEEZING OR SHORTNESS OF BREATH. (Patient taking differently: Inhale 2 puffs into the lungs every 6 (six) hours as needed for wheezing or shortness of breath. INHALE 2 PUFFS INTO THE LUNGS EVERY 6 (SIX) HOURS AS NEEDED FOR WHEEZING OR SHORTNESS OF BREATH.) 18 g 2  . aspirin EC 81 MG tablet Take 81 mg by mouth daily.    . Blood Glucose Monitoring Suppl (TRUE METRIX METER) DEVI 1 kit by Does not apply route 4 (four) times daily. 1 Device 0  . busPIRone (BUSPAR) 15 MG tablet 2  qam   1  q noon  And 1 qhs 120  tablet 3  . cetirizine (ZYRTEC) 10 MG tablet Take 10 mg by mouth daily.    . Cholecalciferol (VITAMIN D) 50 MCG (2000 UT) CAPS Take 2,000 Units by mouth daily.    Marland Kitchen doxepin (SINEQUAN) 50 MG capsule Take 1 capsule (50 mg total) by mouth at bedtime. 30 capsule 2  . escitalopram (LEXAPRO) 10 MG tablet Take 3 tablets (30 mg total) by mouth daily. 30 tablet 3  . fluticasone furoate-vilanterol (BREO ELLIPTA) 200-25 MCG/INH AEPB Inhale 1 puff into the lungs daily. 60 each 6  . gabapentin (NEURONTIN) 600 MG tablet TAKE 1 TABLET BY MOUTH 4 TIMES DAILY 120 tablet 3  . glucose blood (TRUE METRIX BLOOD GLUCOSE TEST) test strip Use as instructed 100 each 12  . hydrocortisone (ANUSOL-HC) 2.5 % rectal cream Place 1 application rectally 2 (two) times daily. 30 g 0  . ipratropium-albuterol (DUONEB) 0.5-2.5 (3) MG/3ML SOLN TAKE 3 MLS VIA NEBULIZATION EVERY 6 HOURS 360 mL 1  . metFORMIN (GLUCOPHAGE) 500 MG tablet TAKE 1 TABLET (500 MG TOTAL) BY MOUTH 2 (TWO) TIMES DAILY WITH A MEAL. 60 tablet 3  . mirtazapine (REMERON) 15 MG tablet Take 2 tablets (30 mg total) by mouth at bedtime. 90 tablet 1  . montelukast (SINGULAIR) 10 MG tablet TAKE 1 TABLET BY MOUTH AT BEDTIME. 30 tablet 2  . nitroGLYCERIN (NITROSTAT) 0.4 MG SL tablet Place 1 tablet (0.4 mg total) under the tongue every 5 (five) minutes as needed for chest pain. 90 tablet 3  . triamcinolone cream (KENALOG) 0.1 % APPLY 1 APPLICATION TOPICALLY 2 TIMES DAILY. 30 g 1  . TRUEPLUS LANCETS 28G MISC 28 g by Does not apply route QID. 120 each 2  . umeclidinium bromide (INCRUSE ELLIPTA) 62.5 MCG/INH AEPB Inhale 1 puff into the lungs daily. 1 each 6  . Vitamin E 180 MG CAPS Take 180 mg by mouth daily.     No current facility-administered medications on file prior to visit.    Allergies  Allergen Reactions  . Avocado Anaphylaxis  . Latex Shortness Of Breath and Rash  . Codeine Nausea Only    Social History   Socioeconomic History  . Marital status: Divorced     Spouse name: Not on file  . Number of children: Not on file  . Years of education: Not on file  . Highest education level: Not on file  Occupational History  . Not on file  Tobacco Use  . Smoking status: Current Every Day Smoker    Packs/day: 1.00    Years: 50.00    Pack years: 50.00    Types: Cigarettes  . Smokeless tobacco: Never Used  Vaping Use  . Vaping Use: Never used  Substance and Sexual Activity  . Alcohol use: Yes    Comment: once a week  . Drug  use: No    Types: Cocaine    Comment: has been years per pt  . Sexual activity: Not Currently    Birth control/protection: Surgical  Other Topics Concern  . Not on file  Social History Narrative  . Not on file   Social Determinants of Health   Financial Resource Strain:   . Difficulty of Paying Living Expenses: Not on file  Food Insecurity:   . Worried About Charity fundraiser in the Last Year: Not on file  . Ran Out of Food in the Last Year: Not on file  Transportation Needs:   . Lack of Transportation (Medical): Not on file  . Lack of Transportation (Non-Medical): Not on file  Physical Activity:   . Days of Exercise per Week: Not on file  . Minutes of Exercise per Session: Not on file  Stress:   . Feeling of Stress : Not on file  Social Connections:   . Frequency of Communication with Friends and Family: Not on file  . Frequency of Social Gatherings with Friends and Family: Not on file  . Attends Religious Services: Not on file  . Active Member of Clubs or Organizations: Not on file  . Attends Archivist Meetings: Not on file  . Marital Status: Not on file  Intimate Partner Violence:   . Fear of Current or Ex-Partner: Not on file  . Emotionally Abused: Not on file  . Physically Abused: Not on file  . Sexually Abused: Not on file    Family History  Problem Relation Age of Onset  . Heart disease Mother   . Hypertension Mother   . Stroke Mother   . Mental illness Mother   . Heart disease  Father   . Hypertension Father   . Diabetes Father   . Cancer Maternal Aunt        breast  . Thyroid disease Paternal Aunt   . Heart disease Maternal Grandfather   . Anesthesia problems Daughter   . Cancer Paternal Grandmother        mouth  . Hypotension Neg Hx   . Malignant hyperthermia Neg Hx   . Pseudochol deficiency Neg Hx   . Colon cancer Neg Hx     Past Surgical History:  Procedure Laterality Date  . ABDOMINAL EXPLORATION SURGERY  1977  . ABDOMINAL HYSTERECTOMY  1977   "left one of my ovaries"  . BACK SURGERY    . BIOPSY  05/27/2019   Procedure: BIOPSY;  Surgeon: Daneil Dolin, MD;  Location: [facility];  Service: Endoscopy;;  . COLONOSCOPY  2011   Dr. Ardis Hughs: Mild diverticulosis, descending diminutive colon polyp (not retrieved), next colonoscopy 10 years  . CORONARY ANGIOPLASTY WITH STENT PLACEMENT  2011 X2   "regular stents didn't work; had to go back in in ~ 1 month and put in medicated stents"  . DILATION AND CURETTAGE OF UTERUS    . ESOPHAGOGASTRODUODENOSCOPY    . ESOPHAGOGASTRODUODENOSCOPY (EGD) WITH PROPOFOL N/A 05/27/2019   normal esophagus, dilation, erosive gastropathy s/p biopsy, normal duodenum. Negative H.pylori.   Marland Kitchen LEFT HEART CATH AND CORONARY ANGIOGRAPHY N/A 03/26/2017   Procedure: LEFT HEART CATH AND CORONARY ANGIOGRAPHY;  Surgeon: Belva Crome, MD;  Location: [facility];  Service: Cardiovascular;  Laterality: N/A;  . LUMBAR LAMINECTOMY/DECOMPRESSION MICRODISCECTOMY  08/05/2011   Procedure: LUMBAR LAMINECTOMY/DECOMPRESSION MICRODISCECTOMY;  Surgeon: Otilio Connors, MD;  Location: [facility];  Service: Neurosurgery;  Laterality: Right;  Right Lumbar four-five extraforaminal  discectomy  . MALONEY DILATION N/A 05/27/2019   Procedure: Venia Minks DILATION;  Surgeon: Daneil Dolin, MD;  Location: [facility];  Service: Endoscopy;  Laterality: N/A;  54  . TONSILLECTOMY     as a child    ROS: Review of Systems Negative except as stated  above  PHYSICAL EXAM: BP (!) 148/70   Pulse 70   Temp 98 F (36.7 C)   Resp 16   Ht 5' 1.5" (1.562 m)   Wt 136 lb 9.6 oz (62 kg)   SpO2 96%   BMI 25.39 kg/m   Wt Readings from Last 3 Encounters:  06/15/20 136 lb 9.6 oz (62 kg)  05/21/20 144 lb 6.4 oz (65.5 kg)  02/10/20 144 lb 6.4 oz (65.5 kg)    Physical Exam  General appearance - alert, well appearing, and in no distress Mental status - normal mood, behavior, speech, dress, motor activity, and thought processes Nose -mild enlargement of nasal turbinates  mouth - mucous membranes moist, pharynx normal without lesions Neck - supple, no significant adenopathy Chest -breath sounds mild to moderately decreased.  No wheezes.  Heart - normal rate, regular rhythm, normal S1, S2, no murmurs, rubs, clicks or gallops Extremities - peripheral pulses normal, no pedal edema, no clubbing or cyanosis  Results for orders placed or performed in visit on 06/15/20  POCT glucose (manual entry)  Result Value Ref Range   POC Glucose 94 70 - 99 mg/dl  POCT glycosylated hemoglobin (Hb A1C)  Result Value Ref Range   Hemoglobin A1C     HbA1c POC (<> result, manual entry)     HbA1c, POC (prediabetic range) 6.0 5.7 - 6.4 %   HbA1c, POC (controlled diabetic range)    POCT URINALYSIS DIP (CLINITEK)  Result Value Ref Range   Color, UA yellow yellow   Clarity, UA cloudy (A) clear   Glucose, UA negative negative mg/dL   Bilirubin, UA small (A) negative   Ketones, POC UA negative negative mg/dL   Spec Grav, UA 1.025 1.010 - 1.025   Blood, UA small (A) negative   pH, UA 6.0 5.0 - 8.0   POC PROTEIN,UA negative negative, trace   Urobilinogen, UA 0.2 0.2 or 1.0 E.U./dL   Nitrite, UA Positive (A) Negative   Leukocytes, UA Large (3+) (A) Negative    CMP Latest Ref Rng & Units 05/21/2020 04/05/2019 09/17/2018  Glucose 70 - 99 mg/dL 84 79 72  BUN 8 - 23 mg/dL _0 Creatinine 0.44 - 1.00 mg/dL 0.83 0.92 0.84  Sodium 135 - 145 mmol/L 140 140 140   Potassium 3.5 - 5.1 mmol/L 4.2 5.0 4.6  Chloride 98 - 111 mmol/L 105 102 100  CO2 22 - 32 mmol/L _1 Calcium 8.9 - 10.3 mg/dL 9.4 9.6 9.8  Total Protein 6.5 - 8.1 g/dL 6.3(L) 6.4 6.7  Total Bilirubin 0.3 - 1.2 mg/dL 0.6 0.3 <0.2  Alkaline Phos 38 - 126 U/L 87 107 91  AST 15 - 41 U/L _2 ALT 0 - 44 U/L _3 Lipid Panel     Component Value Date/Time   CHOL 185 04/05/2019 0926   TRIG 104 04/05/2019 0926   HDL 67 04/05/2019 0926   CHOLHDL 2.8 04/05/2019 0926   CHOLHDL 3.8 04/16/2016 0934   VLDL 15 04/16/2016 0934   LDLCALC 99 04/05/2019 0926    CBC    Component Value Date/Time   WBC 8.5 05/21/2020 0106  RBC 4.49 05/21/2020 0106   HGB 14.9 05/21/2020 0423   HGB 12.6 04/05/2019 0926   HCT 46.9 (H) 05/21/2020 0423   HCT 39.4 04/05/2019 0926   PLT 419 (H) 05/21/2020 0106   PLT 501 (H) 04/05/2019 0926   MCV 97.1 05/21/2020 0106   MCV 91 04/05/2019 0926   MCH 31.2 05/21/2020 0106   MCHC 32.1 05/21/2020 0106   RDW 14.7 05/21/2020 0106   RDW 14.8 04/05/2019 0926   LYMPHSABS 2.7 05/21/2020 0106   LYMPHSABS 2.6 04/05/2019 0926   MONOABS 0.9 05/21/2020 0106   EOSABS 0.4 05/21/2020 0106   EOSABS 0.3 04/05/2019 0926   BASOSABS 0.1 05/21/2020 0106   BASOSABS 0.1 04/05/2019 0926   Lab Results  Component Value Date   HGBA1C 6.0 06/15/2020    ASSESSMENT AND PLAN: 1. Prediabetes Continue Metformin.  Patient advised that she can decrease to taking it once a day to help increase compliance.  Commended her on weight loss and healthy eating habits.  Encouraged her to continue regular exercise - POCT glucose (manual entry) - POCT glycosylated hemoglobin (Hb A1C) - Microalbumin / creatinine urine ratio  2. Essential hypertension Not at goal.  Patient has been out of her medications for 2 days.  Refills given - losartan (COZAAR) 50 MG tablet; Take 1.5 tablets (75 mg total) by mouth daily.  Dispense: 45 tablet; Refill: 6 - furosemide (LASIX) 20 MG tablet; Take 1  tablet (20 mg total) by mouth daily.  Dispense: 30 tablet; Refill: 6 - nebivolol (BYSTOLIC) 10 MG tablet; TAKE 1 TABLET (10 MG TOTAL) BY MOUTH DAILY.  Dispense: 90 tablet; Refill: 4 - amLODipine (NORVASC) 5 MG tablet; Take 1 tablet (5 mg total) by mouth daily.  Dispense: 90 tablet; Refill: 3  3. Other emphysema ([place]) Stable.  Continue current inhalers.  Encouraged her to discontinue smoking  4. Tobacco dependence Encourage her to discontinue smoking.  Patient not mentally ready to quit.  5. Need for immunization against influenza - Flu Vaccine QUAD 36+ mos IM  6. Dysuria - POCT URINALYSIS DIP (CLINITEK) - ciprofloxacin (CIPRO) 500 MG tablet; Take 1 tablet (500 mg total) by mouth 2 (two) times daily.  Dispense: 10 tablet; Refill: 0  7. Acquired hypothyroidism - levothyroxine (SYNTHROID) 100 MCG tablet; Take 1 tablet (100 mcg total) by mouth daily.  Dispense: 30 tablet; Refill: 6 - TSH  8. Coronary artery disease involving native coronary artery of native heart with other form of angina pectoris ([place]) Stable. Advised to use sublingual nitroglycerin whenever she gets chest pains. - atorvastatin (LIPITOR) 80 MG tablet; Take 1 tablet (80 mg total) by mouth daily.  Dispense: 30 tablet; Refill: 6 - Lipid panel - ticagrelor (BRILINTA) 60 MG TABS tablet; Take 1 tablet (60 mg total) by mouth 2 (two) times daily.  Dispense: 60 tablet; Refill: 5 - CBC  9. Gastroesophageal reflux disease without esophagitis - omeprazole (PRILOSEC) 40 MG capsule; Take 1 capsule (40 mg total) by mouth 2 (two) times daily before a meal.  Dispense: 120 capsule; Refill: 0  10. Anxiety and depression Plugged into mental health services.  11. Screening for colon cancer - Ambulatory referral to Gastroenterology  12. Encounter for screening mammogram for malignant neoplasm of breast - MM Digital Screening; Future     Patient was given the opportunity to ask questions.  Patient verbalized understanding of the  plan and was able to repeat key elements of the plan.   Orders Placed This Encounter  Procedures  . MM Digital  Screening  . Flu Vaccine QUAD 36+ mos IM  . Microalbumin / creatinine urine ratio  . TSH  . Lipid panel  . CBC  . Ambulatory referral to Gastroenterology  . POCT glucose (manual entry)  . POCT glycosylated hemoglobin (Hb A1C)  . POCT URINALYSIS DIP (CLINITEK)     Requested Prescriptions   Signed Prescriptions Disp Refills  . losartan (COZAAR) 50 MG tablet 45 tablet 6    Sig: Take 1.5 tablets (75 mg total) by mouth daily.  . cyclobenzaprine (FLEXERIL) 10 MG tablet 30 tablet 4    Sig: Take 1 tablet (10 mg total) by mouth daily as needed.  Marland Kitchen atorvastatin (LIPITOR) 80 MG tablet 30 tablet 6    Sig: Take 1 tablet (80 mg total) by mouth daily.  . furosemide (LASIX) 20 MG tablet 30 tablet 6    Sig: Take 1 tablet (20 mg total) by mouth daily.  Marland Kitchen levothyroxine (SYNTHROID) 100 MCG tablet 30 tablet 6    Sig: Take 1 tablet (100 mcg total) by mouth daily.  Marland Kitchen omeprazole (PRILOSEC) 40 MG capsule 120 capsule 0    Sig: Take 1 capsule (40 mg total) by mouth 2 (two) times daily before a meal.  . nebivolol (BYSTOLIC) 10 MG tablet 90 tablet 4    Sig: TAKE 1 TABLET (10 MG TOTAL) BY MOUTH DAILY.  Marland Kitchen amLODipine (NORVASC) 5 MG tablet 90 tablet 3    Sig: Take 1 tablet (5 mg total) by mouth daily.  . ticagrelor (BRILINTA) 60 MG TABS tablet 60 tablet 5    Sig: Take 1 tablet (60 mg total) by mouth 2 (two) times daily.  . ciprofloxacin (CIPRO) 500 MG tablet 10 tablet 0    Sig: Take 1 tablet (500 mg total) by mouth 2 (two) times daily.    Return in about 4 months (around 10/14/2020).  Karle Plumber, MD, FACP

## 2020-06-16 ENCOUNTER — Telehealth (HOSPITAL_COMMUNITY): Payer: Self-pay | Admitting: Psychiatry

## 2020-06-16 LAB — MICROALBUMIN / CREATININE URINE RATIO
Creatinine, Urine: 111.4 mg/dL
Microalb/Creat Ratio: 34 mg/g creat — ABNORMAL HIGH (ref 0–29)
Microalbumin, Urine: 37.6 ug/mL

## 2020-06-16 LAB — LIPID PANEL
Chol/HDL Ratio: 3.6 ratio (ref 0.0–4.4)
Cholesterol, Total: 206 mg/dL — ABNORMAL HIGH (ref 100–199)
HDL: 57 mg/dL (ref >39)
LDL Chol Calc (NIH): 118 mg/dL — ABNORMAL HIGH (ref 0–99)
Triglycerides: 176 mg/dL — ABNORMAL HIGH (ref 0–149)
VLDL Cholesterol Cal: 31 mg/dL (ref 5–40)

## 2020-06-16 LAB — CBC
Hematocrit: 44.7 % (ref 34.0–46.6)
Hemoglobin: 14.6 g/dL (ref 11.1–15.9)
MCH: 31.5 pg (ref 26.6–33.0)
MCHC: 32.7 g/dL (ref 31.5–35.7)
MCV: 96 fL (ref 79–97)
Platelets: 396 10*3/uL (ref 150–450)
RBC: 4.64 x10E6/uL (ref 3.77–5.28)
RDW: 14.2 % (ref 11.7–15.4)
WBC: 8.9 10*3/uL (ref 3.4–10.8)

## 2020-06-16 LAB — TSH: TSH: 5.88 u[IU]/mL — ABNORMAL HIGH (ref 0.450–4.500)

## 2020-06-17 ENCOUNTER — Encounter: Payer: Self-pay | Admitting: Internal Medicine

## 2020-06-17 NOTE — Progress Notes (Signed)
Let patient know that her thyroid level is slightly off.  Please make sure that she takes the levothyroxine every day as prescribed.  LDL cholesterol is 118 with goal being less than 70.  She should be taking atorvastatin 80 mg daily.  Please inquire whether she was taking it consistently over the past 1 to 2 months.  If she was not, please encourage her to do so.  We will recheck cholesterol level on follow-up visit.  Blood cell counts are normal.  This means red blood cell, white blood cell and platelet counts are normal.

## 2020-06-20 ENCOUNTER — Telehealth: Payer: Self-pay | Admitting: Internal Medicine

## 2020-06-20 DIAGNOSIS — R300 Dysuria: Secondary | ICD-10-CM

## 2020-06-20 NOTE — Telephone Encounter (Signed)
Pt saw Dr Wynetta Emery on 12/02 for bladder issue. Pt had an abx prescribed and it has not cleared it up. Pt state she is not much better, she is still having a little back pain, but it the urgency she is having. Pt states it is like her bladder is not emptying, she still feels the need to urinate.  Pt would like advise please.

## 2020-06-21 ENCOUNTER — Telehealth (INDEPENDENT_AMBULATORY_CARE_PROVIDER_SITE_OTHER): Payer: Self-pay

## 2020-06-21 NOTE — Telephone Encounter (Signed)
Will forward to pcp

## 2020-06-21 NOTE — Telephone Encounter (Signed)
Was unable to get in contact with pt sent pt a MyChart message

## 2020-06-21 NOTE — Telephone Encounter (Signed)
Sent My-Chart message

## 2020-06-22 MED FILL — MIRTAZAPINE 15 MG TABLET: 15 | 30 days supply | Qty: 60 | Fill #1

## 2020-06-26 ENCOUNTER — Other Ambulatory Visit (HOSPITAL_COMMUNITY): Payer: Self-pay | Admitting: Psychiatry

## 2020-06-26 DIAGNOSIS — F419 Anxiety disorder, unspecified: Secondary | ICD-10-CM

## 2020-06-26 MED FILL — ?BUSPIRONE HCL 15MG TABS: 15 | 30 days supply | Qty: 120 | Fill #2

## 2020-06-27 ENCOUNTER — Other Ambulatory Visit: Payer: Self-pay | Admitting: Internal Medicine

## 2020-06-27 DIAGNOSIS — J449 Chronic obstructive pulmonary disease, unspecified: Secondary | ICD-10-CM

## 2020-06-27 MED FILL — $VENTOLIN HFA 18G INHALER: 108 (90 BAS | 25 days supply | Qty: 18 | Fill #0

## 2020-07-18 ENCOUNTER — Telehealth: Payer: Self-pay | Admitting: Gastroenterology

## 2020-07-18 NOTE — Telephone Encounter (Signed)
Hey Dr Christella Hartigan, this is a former pt of yours she is being referred to [place] for a colonoscopy but she went to [facility] 08/31/2019 bc she states she was sent there by her PCP, pt would like to establish with [place] again, would you take the pt back as a patient?

## 2020-07-19 NOTE — Telephone Encounter (Signed)
I'm happy to see her back.  Looks like she's on a blood thinner, will need OV first.  Offer my first available ngt appt, thanks

## 2020-07-24 ENCOUNTER — Other Ambulatory Visit: Payer: Self-pay | Admitting: Pharmacist

## 2020-07-24 ENCOUNTER — Other Ambulatory Visit: Payer: Self-pay | Admitting: Internal Medicine

## 2020-07-24 DIAGNOSIS — J302 Other seasonal allergic rhinitis: Secondary | ICD-10-CM

## 2020-07-24 MED ORDER — INCRUSE ELLIPTA 62.5 MCG/INH IN AEPB: 1.0000 | INHALATION_SPRAY | Freq: Every day | RESPIRATORY_TRACT | 2 refills | Status: DC

## 2020-07-24 MED FILL — LOSARTAN POTASSIUM 50 MG TA: 50 | 30 days supply | Qty: 45 | Fill #1

## 2020-07-24 MED FILL — AMLODIPINE BESYLATE 5 MG TA: 5 | 30 days supply | Qty: 30 | Fill #1

## 2020-07-24 MED FILL — FUROSEMIDE 20 MG TABS: 20 | 30 days supply | Qty: 30 | Fill #0

## 2020-07-24 MED FILL — METFORMIN HCL 500 MG TABS: 500 | 30 days supply | Qty: 60 | Fill #1

## 2020-07-24 MED FILL — MONTELUKAST SOD 10 MG TAB: 10 | 30 days supply | Qty: 30 | Fill #0

## 2020-07-24 MED FILL — DOXEPIN HCL 50 MG CAPS: 50 | 30 days supply | Qty: 30 | Fill #1

## 2020-07-24 MED FILL — LEVOTHYROXINE SODIUM 100 MC: 100 | 30 days supply | Qty: 30 | Fill #0

## 2020-07-24 MED FILL — CYCLOBENZAPRINE 10 MG TAB: 10 | 30 days supply | Qty: 30 | Fill #0

## 2020-07-24 MED FILL — ?MIRTAZAPINE 15 MG TABLET: 15 | 30 days supply | Qty: 60 | Fill #2

## 2020-07-24 MED FILL — $INCRUSE ELLIPTA 62.5 MCG I: 62.5 MCG | 30 days supply | Qty: 30 | Fill #0

## 2020-07-24 MED FILL — ATORVASTATIN CALCIUM 80 MG: 80 | 30 days supply | Qty: 30 | Fill #0

## 2020-07-26 ENCOUNTER — Other Ambulatory Visit (HOSPITAL_COMMUNITY): Payer: Self-pay | Admitting: Psychiatry

## 2020-07-26 ENCOUNTER — Telehealth (INDEPENDENT_AMBULATORY_CARE_PROVIDER_SITE_OTHER): Payer: No Payment, Other | Admitting: Psychiatry

## 2020-07-26 ENCOUNTER — Other Ambulatory Visit: Payer: Self-pay

## 2020-07-26 ENCOUNTER — Encounter (HOSPITAL_COMMUNITY): Payer: Self-pay | Admitting: Psychiatry

## 2020-07-26 DIAGNOSIS — F411 Generalized anxiety disorder: Secondary | ICD-10-CM

## 2020-07-26 DIAGNOSIS — G4700 Insomnia, unspecified: Secondary | ICD-10-CM

## 2020-07-26 DIAGNOSIS — F419 Anxiety disorder, unspecified: Secondary | ICD-10-CM | POA: Diagnosis not present

## 2020-07-26 DIAGNOSIS — F32A Depression, unspecified: Secondary | ICD-10-CM

## 2020-07-26 MED ORDER — ESCITALOPRAM OXALATE 10 MG PO TABS: 30.0000 mg | ORAL_TABLET | Freq: Every day | ORAL | 2 refills | Status: DC

## 2020-07-26 MED ORDER — BUSPIRONE HCL 15 MG PO TABS: ORAL_TABLET | ORAL | 1 refills | Status: DC

## 2020-07-26 MED ORDER — CLONAZEPAM 0.5 MG PO TABS: 0.5000 mg | ORAL_TABLET | Freq: Two times a day (BID) | ORAL | 1 refills | Status: DC | PRN

## 2020-07-26 MED ORDER — DOXEPIN HCL 50 MG PO CAPS: 50.0000 mg | ORAL_CAPSULE | Freq: Every day | ORAL | 2 refills | Status: DC

## 2020-07-26 MED FILL — ?BUSPIRONE HCL 15MG TABS: 15 | 30 days supply | Qty: 120 | Fill #3

## 2020-07-26 NOTE — Progress Notes (Signed)
[facility] MD/PA/NP OP Progress Note  Virtual Visit via Telephone Note  I connected with [name] [name] on 07/26/20 at  2:20 PM EST by telephone and verified that I am speaking with the correct person using two identifiers.  Location: Patient: home Provider: Clinic   I discussed the limitations, risks, security and privacy concerns of performing an evaluation and management service by telephone and the availability of in person appointments. I also discussed with the patient that there may be a patient responsible charge related to this service. The patient expressed understanding and agreed to proceed.   I provided 25 minutes of non-face-to-face time during this encounter.  Extensive amount of time was spent in reviewing the notes from the previous psychiatry provider Dr. Casimiro Needle who took care of her in the past.    07/26/2020 2:17 PM [name] [name]  MRN:  [id number]  Chief Complaint: " I feel very anxious all the time."  HPI: This is a 65 year old female with history of generalized anxiety disorder and depression who was under the care of Dr. Casimiro Needle, seen by him back in August 2021.  Her care was transferred to this clinic due to insurance issues.  Patient stated that she just feels anxious pretty much all the time.  She stated that Lexapro and buspirone helped her mood and because of their help she does not cry and feel depressed all the time anymore.  However her anxiety levels are very high.  She stated that she feels worried about someone trying to come into her house because she lives by herself.  She gets startled easily if she hears any sound outside her house.  She is worried about trivial things and keeps thinking about things that happened in the past. She does have a history of alcohol use disorder however has been sober for about 4 years now. She stated that her daughter lives close by and she sees her frequently.  She stated that the only medicine that has helped her anxiety is  clonazepam and she really wants to get back on it.  She also stated that she has a hard time staying on track and she gets distracted easily and wonders if she has ADD or ADHD.  She stated that maybe she should also try Adderall for that.  Writer noticed that she is on higher than recommended dose of Lexapro at 30 mg.  Patient stated that the dose was increased because the lower dose was not effective after a point of time.  She stated that she believes she really needs to continue the same dose of Lexapro and also buspirone.  She is not sure if mirtazapine is helping her much in terms of her sleep.  She takes doxepin however usually wakes up around 2 or 3 AM in the night and then cannot go back to sleep after that.  Patient once again repeated the clonazepam is only medicine that helped her and also helped her sleep better.  Writer reviewed PDMP.  Patient has a history of substance abuse in the past including alcohol addiction however given the fact the patient has been sober for 3 years and is not displaying any concerning behaviors will restart a low-dose of clonazepam 0.5 mg twice daily with the plan of not going up on the dose any further. Writer explained to the patient that her anxiety may be causing her to feel more fidgety and activating to her inattention.  Writer recommended that she continues taking the doxepin at night so  that she can sleep to some extent and if her anxiety is well controlled with clonazepam then her sleep should improve.   Past Psychiatric History: history of substance use, crack cocaine addiction, alcohol use disorder  Past Medical History:  Past Medical History:  Diagnosis Date  . Anxiety    takes Lexapro daily  . Arthritis    "back, from neck down pass my bra area" (03/25/2017)  . Asthma   . Bartholin gland cyst 08/29/2011  . Bruises easily    pt is on Effient  . Chronic back pain    herniated nucleus pulposus  . Chronic back pain    "neck to bra area; lower  back" (03/25/2017)  . COPD (chronic obstructive pulmonary disease) ([place])    early stages  . Coronary artery disease   . Depression    takes Klonopin daily  . Diabetes mellitus without complication ([place])   . Diverticulosis   . GERD (gastroesophageal reflux disease)    takes Nexium daily  . H/O hiatal hernia   . Heart attack ([place]) 2011  . Hemorrhoids   . Hernia   . Hyperlipidemia    takes Lipitor daily  . Hypertension    takes Losartan daily and Labetalol bid  . Hypothyroidism    takes Synthroid daily  . Insomnia    hydroxyzine prn  . Joint pain   . Pneumonia    "couple times" (03/25/2017)  . Pre-diabetes    "just found out 1 wk ago" (03/25/2017)  . Psoriasis    elbows,knees,back  . Shortness of breath    with exertion  . Slowing of urinary stream   . Stress incontinence     Past Surgical History:  Procedure Laterality Date  . ABDOMINAL EXPLORATION SURGERY  1977  . ABDOMINAL HYSTERECTOMY  1977   "left one of my ovaries"  . BACK SURGERY    . BIOPSY  05/27/2019   Procedure: BIOPSY;  Surgeon: Daneil Dolin, MD;  [facility]: [facility];  Service: Endoscopy;;  . COLONOSCOPY  2011   Dr. Ardis Hughs: Mild diverticulosis, descending diminutive colon polyp (not retrieved), next colonoscopy 10 years  . CORONARY ANGIOPLASTY WITH STENT PLACEMENT  2011 X2   "regular stents didn't work; had to go back in in ~ 1 month and put in medicated stents"  . DILATION AND CURETTAGE OF UTERUS    . ESOPHAGOGASTRODUODENOSCOPY    . ESOPHAGOGASTRODUODENOSCOPY (EGD) WITH PROPOFOL N/A 05/27/2019   normal esophagus, dilation, erosive gastropathy s/p biopsy, normal duodenum. Negative H.pylori.   Marland Kitchen LEFT HEART CATH AND CORONARY ANGIOGRAPHY N/A 03/26/2017   Procedure: LEFT HEART CATH AND CORONARY ANGIOGRAPHY;  Surgeon: Belva Crome, MD;  Location: [facility];  Service: Cardiovascular;  Laterality: N/A;  . LUMBAR LAMINECTOMY/DECOMPRESSION MICRODISCECTOMY  08/05/2011   Procedure: LUMBAR  LAMINECTOMY/DECOMPRESSION MICRODISCECTOMY;  Surgeon: Otilio Connors, MD;  Location: [facility];  Service: Neurosurgery;  Laterality: Right;  Right Lumbar four-five extraforaminal discectomy  . MALONEY DILATION N/A 05/27/2019   Procedure: Venia Minks DILATION;  Surgeon: Daneil Dolin, MD;  [facility]: [facility];  Service: Endoscopy;  Laterality: N/A;  54  . TONSILLECTOMY     as a child    Family Psychiatric History: See intake H&P for full details. Reviewed, with no updates at this time.   Family History:  Family History  Problem Relation Age of Onset  . Heart disease Mother   . Hypertension Mother   . Stroke Mother   . Mental illness Mother   .  Heart disease Father   . Hypertension Father   . Diabetes Father   . Cancer Maternal Aunt        breast  . Thyroid disease Paternal Aunt   . Heart disease Maternal Grandfather   . Anesthesia problems Daughter   . Cancer Paternal Grandmother        mouth  . Hypotension Neg Hx   . Malignant hyperthermia Neg Hx   . Pseudochol deficiency Neg Hx   . Colon cancer Neg Hx     Social History:  Social History   Socioeconomic History  . Marital status: Divorced    Spouse name: Not on file  . Number of children: Not on file  . Years of education: Not on file  . Highest education level: Not on file  Occupational History  . Not on file  Tobacco Use  . Smoking status: Current Every Day Smoker    Packs/day: 1.00    Years: 50.00    Pack years: 50.00    Types: Cigarettes  . Smokeless tobacco: Never Used  Vaping Use  . Vaping Use: Never used  Substance and Sexual Activity  . Alcohol use: Yes    Comment: once a week  . Drug use: No    Types: Cocaine    Comment: has been years per pt  . Sexual activity: Not Currently    Birth control/protection: Surgical  Other Topics Concern  . Not on file  Social History Narrative  . Not on file   Social Determinants of Health   Financial Resource Strain: Not on file  Food Insecurity: Not on  file  Transportation Needs: Not on file  Physical Activity: Not on file  Stress: Not on file  Social Connections: Not on file    Allergies:  Allergies  Allergen Reactions  . Avocado Anaphylaxis  . Latex Shortness Of Breath and Rash  . Codeine Nausea Only    Metabolic Disorder Labs: Lab Results  Component Value Date   HGBA1C 6.0 06/15/2020   No results found for: PROLACTIN Lab Results  Component Value Date   CHOL 206 (H) 06/15/2020   TRIG 176 (H) 06/15/2020   HDL 57 06/15/2020   CHOLHDL 3.6 06/15/2020   VLDL 15 04/16/2016   LDLCALC 118 (H) 06/15/2020   LDLCALC 99 04/05/2019   Lab Results  Component Value Date   TSH 5.880 (H) 06/15/2020   TSH 0.352 (L) 04/05/2019    Therapeutic Level Labs: No results found for: LITHIUM No results found for: VALPROATE No components found for:  CBMZ  Current Medications: Current Outpatient Medications  Medication Sig Dispense Refill  . albuterol (VENTOLIN HFA) 108 (90 Base) MCG/ACT inhaler INHALE 2 PUFFS INTO THE LUNGS EVERY 6 (SIX) HOURS AS NEEDED FOR WHEEZING OR SHORTNESS OF BREATH. 18 g 2  . amLODipine (NORVASC) 5 MG tablet Take 1 tablet (5 mg total) by mouth daily. 90 tablet 3  . aspirin EC 81 MG tablet Take 81 mg by mouth daily.    Marland Kitchen atorvastatin (LIPITOR) 80 MG tablet Take 1 tablet (80 mg total) by mouth daily. 30 tablet 6  . Blood Glucose Monitoring Suppl (TRUE METRIX METER) DEVI 1 kit by Does not apply route 4 (four) times daily. 1 Device 0  . busPIRone (BUSPAR) 15 MG tablet 2  qam   1  q noon  And 1 qhs 120 tablet 3  . cetirizine (ZYRTEC) 10 MG tablet Take 10 mg by mouth daily.    . Cholecalciferol (VITAMIN D) 50  MCG (2000 UT) CAPS Take 2,000 Units by mouth daily.    . ciprofloxacin (CIPRO) 500 MG tablet Take 1 tablet (500 mg total) by mouth 2 (two) times daily. 10 tablet 0  . cyclobenzaprine (FLEXERIL) 10 MG tablet Take 1 tablet (10 mg total) by mouth daily as needed. 30 tablet 4  . doxepin (SINEQUAN) 50 MG capsule Take 1  capsule (50 mg total) by mouth at bedtime. 30 capsule 2  . escitalopram (LEXAPRO) 10 MG tablet Take 3 tablets (30 mg total) by mouth daily. 30 tablet 3  . fluticasone furoate-vilanterol (BREO ELLIPTA) 200-25 MCG/INH AEPB Inhale 1 puff into the lungs daily. 60 each 6  . furosemide (LASIX) 20 MG tablet Take 1 tablet (20 mg total) by mouth daily. 30 tablet 6  . gabapentin (NEURONTIN) 600 MG tablet TAKE 1 TABLET BY MOUTH 4 TIMES DAILY 120 tablet 3  . glucose blood (TRUE METRIX BLOOD GLUCOSE TEST) test strip Use as instructed 100 each 12  . hydrocortisone (ANUSOL-HC) 2.5 % rectal cream Place 1 application rectally 2 (two) times daily. 30 g 0  . ipratropium-albuterol (DUONEB) 0.5-2.5 (3) MG/3ML SOLN TAKE 3 MLS VIA NEBULIZATION EVERY 6 HOURS 360 mL 1  . levothyroxine (SYNTHROID) 100 MCG tablet Take 1 tablet (100 mcg total) by mouth daily. 30 tablet 6  . losartan (COZAAR) 50 MG tablet Take 1.5 tablets (75 mg total) by mouth daily. 45 tablet 6  . metFORMIN (GLUCOPHAGE) 500 MG tablet TAKE 1 TABLET (500 MG TOTAL) BY MOUTH 2 (TWO) TIMES DAILY WITH A MEAL. 60 tablet 3  . mirtazapine (REMERON) 15 MG tablet Take 2 tablets (30 mg total) by mouth at bedtime. 90 tablet 1  . montelukast (SINGULAIR) 10 MG tablet TAKE 1 TABLET BY MOUTH AT BEDTIME. 30 tablet 2  . nebivolol (BYSTOLIC) 10 MG tablet TAKE 1 TABLET (10 MG TOTAL) BY MOUTH DAILY. 90 tablet 4  . nitroGLYCERIN (NITROSTAT) 0.4 MG SL tablet Place 1 tablet (0.4 mg total) under the tongue every 5 (five) minutes as needed for chest pain. 90 tablet 3  . omeprazole (PRILOSEC) 40 MG capsule Take 1 capsule (40 mg total) by mouth 2 (two) times daily before a meal. 120 capsule 0  . ticagrelor (BRILINTA) 60 MG TABS tablet Take 1 tablet (60 mg total) by mouth 2 (two) times daily. 60 tablet 5  . triamcinolone cream (KENALOG) 0.1 % APPLY 1 APPLICATION TOPICALLY 2 TIMES DAILY. 30 g 1  . TRUEPLUS LANCETS 28G MISC 28 g by Does not apply route QID. 120 each 2  . umeclidinium  bromide (INCRUSE ELLIPTA) 62.5 MCG/INH AEPB Inhale 1 puff into the lungs daily. 30 each 2  . Vitamin E 180 MG CAPS Take 180 mg by mouth daily.     No current facility-administered medications for this visit.    Musculoskeletal: Strength & Muscle Tone: within normal limits Gait & Station: normal Patient leans: N/A  Psychiatric Specialty Exam: ROS  There were no vitals taken for this visit.There is no height or weight on file to calculate BMI.  General Appearance: unable to assess due to phone visit  Eye Contact:  unable to assess due to phone visit  Speech:  Clear and Coherent and Normal Rate  Volume:  Normal  Mood:  Anxious  Affect:  Congruent  Thought Process:  Goal Directed and Descriptions of Associations: Intact  Orientation:  Full (Time, Place, and Person)  Thought Content: Logical   Suicidal Thoughts:  No  Homicidal Thoughts:  No  Memory:  Immediate;   Good  Judgement:  Fair  Insight:  Fair  Psychomotor Activity:  Normal  Concentration:  Concentration: Fair  Recall:  [employer] of Knowledge: Fair  Language: Fair  Akathisia:  Negative  Handed:  Right  AIMS (if indicated): not done  Assets:  Communication Skills Desire for Improvement Housing  ADL's:  Intact  Cognition: WNL  Sleep:  Poor   Screenings: AUDIT   Flowsheet Row Admission (Discharged) from 07/10/2013 in [facility]  Alcohol Use Disorder Identification Test Final Score (AUDIT) 12    GAD-7   Flowsheet Row Office Visit from 06/15/2020 in [facility] from 01/20/2020 in [facility] Office Visit from 11/18/2019 in [facility] Office Visit from 09/06/2019 in [facility] Office Visit from 12/25/2017 in [facility]  Total GAD-7 Score 14 0 7 0 18    PHQ2-9   [place] Office Visit from 06/15/2020 in [facility] from 01/20/2020 in [facility] Office Visit from 11/18/2019 in [facility] Office Visit from 09/06/2019 in [facility] Office Visit from 03/25/2019 in [facility]  PHQ-2 Total Score 3 0 2 0 0  PHQ-9 Total Score 9 -- 6 -- 0       Assessment and Plan: Patient continues to endorse anxiety despite being on a higher than recommended dose of Lexapro.  She also takes buspirone total of 60 mg in 1 day.  Patient stated that she lives by herself and she constantly feels anxious.  Patient believes that clonazepam is the only medicine that has helped her.  Will prescribe a low-dose of clonazepam with no plans of going up on the dose.  Patient was clearly explained that she should not mix it with alcohol.  1. Anxiety and depression  - escitalopram (LEXAPRO) 10 MG tablet; Take 3 tablets (30 mg total) by mouth daily.  Dispense: 30 tablet; Refill: 2 - busPIRone (BUSPAR) 15 MG tablet; Take 2 tablets in the morning, 1 tab at noon and 1 tab in the evening  Dispense: 120 tablet; Refill: 1 -Discontinue mirtazapine due to lack of efficacy.  2. Insomnia, unspecified type  -Continue doxepin (SINEQUAN) 50 MG capsule; Take 1 capsule (50 mg total) by mouth at bedtime.  Dispense: 30 capsule; Refill: 2  3. GAD (generalized anxiety disorder)  - busPIRone (BUSPAR) 15 MG tablet; Take 2 tablets in the morning, 1 tab at noon and 1 tab in the evening  Dispense: 120 tablet; Refill: 1 - Start clonazePAM (KLONOPIN) 0.5 MG tablet; Take 1 tablet (0.5 mg total) by mouth 2 (two) times daily as needed for anxiety.  Dispense: 60 tablet; Refill: 1  F/up in 2 months. Continue individual therapy and palliative/hospice grief counselor   [place] Crane, MD 07/26/2020, 2:17 PM

## 2020-08-02 ENCOUNTER — Telehealth (HOSPITAL_COMMUNITY): Payer: Self-pay | Admitting: Licensed Clinical Social Worker

## 2020-08-02 ENCOUNTER — Other Ambulatory Visit: Payer: Self-pay

## 2020-08-02 ENCOUNTER — Ambulatory Visit (HOSPITAL_COMMUNITY): Payer: Self-pay | Admitting: Licensed Clinical Social Worker

## 2020-08-02 NOTE — Telephone Encounter (Signed)
LCSW sent text link for video session per schedule. When pt failed to sign on LCSW called pt. Phone rang with no answer and no ability to lvm as "not set up".

## 2020-08-29 ENCOUNTER — Other Ambulatory Visit: Payer: Self-pay | Admitting: Internal Medicine

## 2020-08-29 DIAGNOSIS — J302 Other seasonal allergic rhinitis: Secondary | ICD-10-CM

## 2020-08-29 MED FILL — LEVOTHYROXINE SODIUM 100 MC: 100 | 30 days supply | Qty: 30 | Fill #1

## 2020-08-29 MED FILL — AMLODIPINE BESYLATE 5 MG TA: 5 | 30 days supply | Qty: 30 | Fill #2

## 2020-08-29 MED FILL — $VENTOLIN HFA 18G INHALER: 108 (90 BAS | 25 days supply | Qty: 18 | Fill #1

## 2020-08-29 MED FILL — ESCITALOPRAM 10 MG TABLET: 10 | 30 days supply | Qty: 90 | Fill #0

## 2020-08-29 MED FILL — $BREO ELLIPTA 200-25 MCG IN: 200-25 | 30 days supply | Qty: 60 | Fill #0

## 2020-08-29 MED FILL — $INCRUSE ELLIPTA 62.5 MCG I: 62.5 MCG | 30 days supply | Qty: 30 | Fill #1

## 2020-08-29 MED FILL — ?FUROSEMIDE 20MG TABLET: 20 | 30 days supply | Qty: 30 | Fill #1

## 2020-08-29 MED FILL — CYCLOBENZAPRINE 10 MG TAB: 10 | 30 days supply | Qty: 30 | Fill #1

## 2020-08-29 MED FILL — OMEPRAZOLE DR 40 MG CAPSULE: 40 | 30 days supply | Qty: 60 | Fill #1

## 2020-08-30 ENCOUNTER — Ambulatory Visit (INDEPENDENT_AMBULATORY_CARE_PROVIDER_SITE_OTHER): Payer: Self-pay | Admitting: Gastroenterology

## 2020-08-30 ENCOUNTER — Encounter: Payer: Self-pay | Admitting: Gastroenterology

## 2020-08-30 ENCOUNTER — Other Ambulatory Visit (INDEPENDENT_AMBULATORY_CARE_PROVIDER_SITE_OTHER): Payer: Self-pay

## 2020-08-30 DIAGNOSIS — R109 Unspecified abdominal pain: Secondary | ICD-10-CM

## 2020-08-30 DIAGNOSIS — R634 Abnormal weight loss: Secondary | ICD-10-CM

## 2020-08-30 LAB — CBC
HCT: 44.3 % (ref 36.0–46.0)
Hemoglobin: 15.1 g/dL — ABNORMAL HIGH (ref 12.0–15.0)
MCHC: 34.1 g/dL (ref 30.0–36.0)
MCV: 95.8 fl (ref 78.0–100.0)
Platelets: 418 10*3/uL — ABNORMAL HIGH (ref 150.0–400.0)
RBC: 4.62 Mil/uL (ref 3.87–5.11)
RDW: 14.6 % (ref 11.5–15.5)
WBC: 10.6 10*3/uL — ABNORMAL HIGH (ref 4.0–10.5)

## 2020-08-30 LAB — COMPREHENSIVE METABOLIC PANEL
ALT: 11 U/L (ref 0–35)
AST: 13 U/L (ref 0–37)
Albumin: 4.4 g/dL (ref 3.5–5.2)
Alkaline Phosphatase: 93 U/L (ref 39–117)
BUN: 20 mg/dL (ref 6–23)
CO2: 28 mEq/L (ref 19–32)
Calcium: 9.5 mg/dL (ref 8.4–10.5)
Chloride: 99 mEq/L (ref 96–112)
Creatinine, Ser: 0.87 mg/dL (ref 0.40–1.20)
GFR: 70.52 mL/min (ref >60.00)
Glucose, Bld: 147 mg/dL — ABNORMAL HIGH (ref 70–99)
Potassium: 4.1 mEq/L (ref 3.5–5.1)
Sodium: 137 mEq/L (ref 135–145)
Total Bilirubin: 0.3 mg/dL (ref 0.2–1.2)
Total Protein: 7.4 g/dL (ref 6.0–8.3)

## 2020-08-30 NOTE — Patient Instructions (Addendum)
If you are age 65 or younger, your body mass index should be between 19-25. Your Body mass index is 24.72 kg/m. If this is out of the aformentioned range listed, please consider follow up with your Primary Care Provider.   Your provider has requested that you go to the basement level for lab work before leaving today. Press "B" on the elevator. The lab is located at the first door on the left as you exit the elevator.  Due to recent changes in healthcare laws, you may see the results of your imaging and laboratory studies on MyChart before your provider has had a chance to review them.  We understand that in some cases there may be results that are confusing or concerning to you. Not all laboratory results come back in the same time frame and the provider may be waiting for multiple results in order to interpret others.  Please give [place] 48 hours in order for your provider to thoroughly review all the results before contacting the office for clarification of your results.    You will be contacted by [facility] in the next 2 days to arrange a CT scan of the abdomen and pelvis..  The number on your caller ID will be [phone], please answer when they call.  If you have not heard from them in 2 days please call [phone] to schedule.     You have been scheduled for a CT scan of the abdomen and pelvis at [facility], 1st floor Radiology. You are scheduled on _______  at ___________. You should arrive 15 minutes prior to your appointment time for registration.  Please pick up 2 bottles of contrast from [facility] at least 3 days prior to your scan. The solution may taste better if refrigerated, but do NOT add ice or any other liquid to this solution. Shake well before drinking.   Please follow the written instructions below on the day of your exam:   1) Do not eat anything after _____________ (4 hours prior to your test)   2) Drink 1 bottle of contrast @ ________(2 hours  prior to your exam)  Remember to shake well before drinking and do NOT pour over ice.     Drink 1 bottle of contrast @ __________(1 hour prior to your exam)   You may take any medications as prescribed with a small amount of water, if necessary. If you take any of the following medications: METFORMIN, GLUCOPHAGE, GLUCOVANCE, AVANDAMET, RIOMET, FORTAMET, [place] MET, JANUMET, GLUMETZA or METAGLIP, you MAY be asked to HOLD this medication 48 hours AFTER the exam.   The purpose of you drinking the oral contrast is to aid in the visualization of your intestinal tract. The contrast solution may cause some diarrhea. Depending on your individual set of symptoms, you may also receive an intravenous injection of x-ray contrast/dye. Plan on being at [facility] for 45 minutes or longer, depending on the type of exam you are having performed.   If you have any questions regarding your exam or if you need to reschedule, you may call [facility] at [phone] between the hours of 8:00 am and 5:00 pm, Monday-Friday.    Once results of CT scan has been reviewed, we will decide if colonoscopy and possible EGD is needed.   Thank you for entrusting me with your care and choosing [facility].  Dr Ardis Hughs

## 2020-08-30 NOTE — Progress Notes (Signed)
HPI: This is a very pleasant 65 year old woman who was referred to me by Ladell Pier, MD  to evaluate rectal bleeding.    She is here with her daughter today.  She has quite a bit more to discuss than just rectal bleeding.  She has alternating constipation and diarrhea.  She has had minor rectal bleeding a few times in the past several months.  This generally happens with a BM.  She has had fecal incontinence when passing gas a few times as well.  She also has had unintentional weight loss, about 10 pounds in the past 3 or 4 months.  She has mild upper abdominal pains, chronic nausea.  She also has back pains.  She takes 2 Aleve every morning and an aspirin every day.  She is on omeprazole which she takes twice daily.  Colon cancer does not run in her family.  She does think she has had diverticulitis in the past.  She is on Brilinta for coronary artery disease and drug-eluting stents.  Previous cocaine user, none in several months.  She only rarely drinks alcohol.   Old Data Reviewed: I reviewed an ER provider note from November 2021 when she presented with mild, limited hematochezia.  She was having some constipation around the time.  She was on Brilinta.  She was also smoking crack cocaine.  She was Hemoccult negative in the ER.  Her hemoglobin was normal.  She was previously a patient with [facility].  She underwent an EGD November 2020 for dysphagia with Dr. Buford Dresser.  He documented a normal esophagus and some erosions in her stomach.  The biopsies from her stomach showed no evidence of H. pylori.  He performed Maloney dilation of a normal appearing esophagus.  CBC December 2021 was completely normal.  Review of systems: Pertinent positive and negative review of systems were noted in the above HPI section. All other review negative.   Past Medical History:  Diagnosis Date  . Anxiety    takes Lexapro daily  . Arthritis    "back, from neck down pass my bra area"  (03/25/2017)  . Asthma   . Bartholin gland cyst 08/29/2011  . Bruises easily    pt is on Effient  . Chronic back pain    herniated nucleus pulposus  . Chronic back pain    "neck to bra area; lower back" (03/25/2017)  . COPD (chronic obstructive pulmonary disease) ([place])    early stages  . Coronary artery disease   . Depression    takes Klonopin daily  . Diabetes mellitus [place] complication [place])   . Diverticulosis   . GERD (gastroesophageal reflux disease)    takes Nexium daily  . H/O hiatal hernia   . Heart attack ([place]) 2011  . Hemorrhoids   . Hernia   . Hyperlipidemia    takes Lipitor daily  . Hypertension    takes Losartan daily and Labetalol bid  . Hypothyroidism    takes Synthroid daily  . Insomnia    hydroxyzine prn  . Joint pain   . Pneumonia    "couple times" (03/25/2017)  . Pre-diabetes    "just found out 1 wk ago" (03/25/2017)  . Psoriasis    elbows,knees,back  . Shortness of breath    with exertion  . Slowing of urinary stream   . Stress incontinence     Past Surgical History:  Procedure Laterality Date  . ABDOMINAL EXPLORATION SURGERY  1977  . ABDOMINAL HYSTERECTOMY  1977   "  left one of my ovaries"  . BACK SURGERY    . BIOPSY  05/27/2019   Procedure: BIOPSY;  Surgeon: Daneil Dolin, MD;  [facility]: [facility];  Service: Endoscopy;;  . COLONOSCOPY  2011   Dr. Ardis Hughs: Mild diverticulosis, descending diminutive colon polyp (not retrieved), next colonoscopy 10 years  . CORONARY ANGIOPLASTY WITH STENT PLACEMENT  2011 X2   "regular stents didn't work; had to go back in in ~ 1 month and put in medicated stents"  . DILATION AND CURETTAGE OF UTERUS    . ESOPHAGOGASTRODUODENOSCOPY    . ESOPHAGOGASTRODUODENOSCOPY (EGD) WITH PROPOFOL N/A 05/27/2019   normal esophagus, dilation, erosive gastropathy s/p biopsy, normal duodenum. Negative H.pylori.   Marland Kitchen LEFT HEART CATH AND CORONARY ANGIOGRAPHY N/A 03/26/2017   Procedure: LEFT HEART CATH AND CORONARY ANGIOGRAPHY;   Surgeon: Belva Crome, MD;  Location: [facility];  Service: Cardiovascular;  Laterality: N/A;  . LUMBAR LAMINECTOMY/DECOMPRESSION MICRODISCECTOMY  08/05/2011   Procedure: LUMBAR LAMINECTOMY/DECOMPRESSION MICRODISCECTOMY;  Surgeon: Otilio Connors, MD;  Location: [facility];  Service: Neurosurgery;  Laterality: Right;  Right Lumbar four-five extraforaminal discectomy  . MALONEY DILATION N/A 05/27/2019   Procedure: Venia Minks DILATION;  Surgeon: Daneil Dolin, MD;  Location: [facility];  Service: Endoscopy;  Laterality: N/A;  54  . TONSILLECTOMY     as a child    Current Outpatient Medications  Medication Sig Dispense Refill  . albuterol (VENTOLIN HFA) 108 (90 Base) MCG/ACT inhaler INHALE 2 PUFFS INTO THE LUNGS EVERY 6 (SIX) HOURS AS NEEDED FOR WHEEZING OR SHORTNESS OF BREATH. 18 g 2  . amLODipine (NORVASC) 5 MG tablet Take 1 tablet (5 mg total) by mouth daily. 90 tablet 3  . aspirin EC 81 MG tablet Take 81 mg by mouth daily.    Marland Kitchen atorvastatin (LIPITOR) 80 MG tablet Take 1 tablet (80 mg total) by mouth daily. 30 tablet 6  . Blood Glucose Monitoring Suppl (TRUE METRIX METER) DEVI 1 kit by Does not apply route 4 (four) times daily. 1 Device 0  . busPIRone (BUSPAR) 15 MG tablet Take 2 tablets in the morning, 1 tab at noon and 1 tab in the evening 120 tablet 1  . cetirizine (ZYRTEC) 10 MG tablet Take 10 mg by mouth daily.    . Cholecalciferol (VITAMIN D) 50 MCG (2000 UT) CAPS Take 2,000 Units by mouth daily.    . clonazePAM (KLONOPIN) 0.5 MG tablet Take 1 tablet (0.5 mg total) by mouth 2 (two) times daily as needed for anxiety. 60 tablet 1  . cyclobenzaprine (FLEXERIL) 10 MG tablet Take 1 tablet (10 mg total) by mouth daily as needed. 30 tablet 4  . doxepin (SINEQUAN) 50 MG capsule Take 1 capsule (50 mg total) by mouth at bedtime. 30 capsule 2  . escitalopram (LEXAPRO) 10 MG tablet Take 3 tablets (30 mg total) by mouth daily. 30 tablet 2  . fluticasone furoate-vilanterol (BREO ELLIPTA)  200-25 MCG/INH AEPB Inhale 1 puff into the lungs daily. 60 each 6  . furosemide (LASIX) 20 MG tablet Take 1 tablet (20 mg total) by mouth daily. 30 tablet 6  . gabapentin (NEURONTIN) 600 MG tablet TAKE 1 TABLET BY MOUTH 4 TIMES DAILY 120 tablet 3  . glucose blood (TRUE METRIX BLOOD GLUCOSE TEST) test strip Use as instructed 100 each 12  . hydrocortisone (ANUSOL-HC) 2.5 % rectal cream Place 1 application rectally 2 (two) times daily. 30 g 0  . ipratropium-albuterol (DUONEB) 0.5-2.5 (3) MG/3ML SOLN  TAKE 3 MLS VIA NEBULIZATION EVERY 6 HOURS 360 mL 1  . levothyroxine (SYNTHROID) 100 MCG tablet Take 1 tablet (100 mcg total) by mouth daily. 30 tablet 6  . losartan (COZAAR) 50 MG tablet Take 1.5 tablets (75 mg total) by mouth daily. 45 tablet 6  . metFORMIN (GLUCOPHAGE) 500 MG tablet TAKE 1 TABLET (500 MG TOTAL) BY MOUTH 2 (TWO) TIMES DAILY WITH A MEAL. 60 tablet 3  . montelukast (SINGULAIR) 10 MG tablet TAKE 1 TABLET BY MOUTH AT BEDTIME. 30 tablet 2  . nebivolol (BYSTOLIC) 10 MG tablet TAKE 1 TABLET (10 MG TOTAL) BY MOUTH DAILY. 90 tablet 4  . omeprazole (PRILOSEC) 40 MG capsule Take 1 capsule (40 mg total) by mouth 2 (two) times daily before a meal. 120 capsule 0  . ticagrelor (BRILINTA) 60 MG TABS tablet Take 1 tablet (60 mg total) by mouth 2 (two) times daily. 60 tablet 5  . triamcinolone cream (KENALOG) 0.1 % APPLY 1 APPLICATION TOPICALLY 2 TIMES DAILY. 30 g 1  . TRUEPLUS LANCETS 28G MISC 28 g by Does not apply route QID. 120 each 2  . umeclidinium bromide (INCRUSE ELLIPTA) 62.5 MCG/INH AEPB Inhale 1 puff into the lungs daily. 30 each 2  . Vitamin E 180 MG CAPS Take 180 mg by mouth daily.    . nitroGLYCERIN (NITROSTAT) 0.4 MG SL tablet Place 1 tablet (0.4 mg total) under the tongue every 5 (five) minutes as needed for chest pain. 90 tablet 3   No current facility-administered medications for this visit.    Allergies as of 08/30/2020 - Review Complete 08/30/2020  Allergen Reaction Noted  .  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 Latex Shortness Of Breath and Rash 12/22/2008  . Codeine Nausea Only 12/22/2008    Family History  Problem Relation Age of Onset  . Heart disease Mother   . Hypertension Mother   . Stroke Mother   . Mental illness Mother   . Heart disease Father   . Hypertension Father   . Diabetes Father   . Breast cancer Maternal Aunt   . Thyroid disease Paternal Aunt   . Heart disease Maternal Grandfather   . Anesthesia problems Daughter   . Cancer Paternal Grandmother        mouth  . Throat cancer Maternal Uncle   . Hypotension Neg Hx   . Malignant hyperthermia Neg Hx   . Pseudochol deficiency Neg Hx   . Colon cancer Neg Hx   . Stomach cancer Neg Hx   . Colon polyps Neg Hx   . Esophageal cancer Neg Hx   . Pancreatic cancer Neg Hx     Social History   Socioeconomic History  . Marital status: Divorced    Spouse name: Not on file  . Number of children: Not on file  . Years of education: Not on file  . Highest education level: Not on file  Occupational History  . Not on file  Tobacco Use  . Smoking status: Current Every Day Smoker    Packs/day: 1.00    Years: 50.00    Pack years: 50.00    Types: Cigarettes  . Smokeless tobacco: Never Used  Vaping Use  . Vaping Use: Never used  Substance and Sexual Activity  . Alcohol use: Yes    Comment: once a week  . Drug use: No    Types: Cocaine    Comment: has been years per pt  . Sexual activity: Not Currently    Birth control/protection: Surgical  Other Topics Concern  . Not on file  Social History Narrative  . Not on file   Social Determinants of Health   Financial Resource Strain: Not on file  Food Insecurity: Not on file  Transportation Needs: Not on file  Physical Activity: Not on file  Stress: Not on file  Social Connections: Not on file  Intimate Partner Violence: Not on file     Physical Exam: Ht 5' 1.5" (1.562 m)   Wt 133 lb (60.3 kg)   BMI 24.72 kg/m  Constitutional:  generally well-appearing Psychiatric: alert and oriented x3 Eyes: extraocular movements intact Mouth: oral pharynx moist, no lesions Neck: supple no lymphadenopathy Cardiovascular: heart regular rate and rhythm Lungs: clear to auscultation bilaterally Abdomen: soft, mildly tender upper abdomen, nondistended, no obvious ascites, no peritoneal signs, normal bowel sounds Extremities: no lower extremity edema bilaterally Skin: no lesions on visible extremities   Assessment and plan: 65 y.o. female with abdominal pain, abdominal tenderness, alternating constipation and diarrhea, mild rectal bleeding, unintentional weight loss  She has myriad of upper and lower GI symptoms and I am somewhat concerned about potential underlying malignancy especially given the fact that she is having unintentional weight loss.  I recommended we start her work-up with imaging of her abdomen with a CT scan abdomen pelvis IV and oral contrast as well as basic lab work with CBC and complete metabolic profile.  She understands that she will very likely need further testing, including a colonoscopy but also possible upper endoscopy.   Please see the "Patient Instructions" section for addition details about the plan.   Owens Loffler, MD [facility] 08/30/2020, 1:34 PM  Cc: Ladell Pier, MD  Total time on date of encounter was 45  minutes (this included time spent preparing to see the patient reviewing records; obtaining and/or reviewing separately obtained history; performing a medically appropriate exam and/or evaluation; counseling and educating the patient and family if present; ordering medications, tests or procedures if applicable; and documenting clinical information in the health record).

## 2020-08-30 NOTE — Addendum Note (Signed)
Addended by: Stevan Born on: 08/30/2020 03:40 PM   Modules accepted: Orders

## 2020-09-14 ENCOUNTER — Ambulatory Visit (HOSPITAL_COMMUNITY)
Admission: RE | Admit: 2020-09-14 | Discharge: 2020-09-14 | Disposition: A | Discharge status: Home / Self Care | Payer: Self-pay | Source: Ambulatory Visit | Attending: Gastroenterology | Admitting: Gastroenterology

## 2020-09-14 ENCOUNTER — Other Ambulatory Visit: Payer: Self-pay

## 2020-09-14 DIAGNOSIS — R109 Unspecified abdominal pain: Secondary | ICD-10-CM | POA: Insufficient documentation

## 2020-09-14 DIAGNOSIS — R634 Abnormal weight loss: Secondary | ICD-10-CM | POA: Insufficient documentation

## 2020-09-14 MED ORDER — IOHEXOL 300 MG/ML  SOLN
100.0000 mL | Freq: Once | INTRAMUSCULAR | Status: AC | PRN
  Administered 2020-09-14: 100 mL via INTRAVENOUS

## 2020-09-18 MED FILL — ATORVASTATIN CALCIUM 80 MG: 80 | 30 days supply | Qty: 30 | Fill #1

## 2020-09-18 MED FILL — MONTELUKAST SOD 10 MG TAB: 10 | 30 days supply | Qty: 30 | Fill #1

## 2020-09-18 MED FILL — ?BUSPIRONE HCL 15MG TABS: 15 | 30 days supply | Qty: 120 | Fill #0

## 2020-09-18 MED FILL — LOSARTAN POTASSIUM 50 MG TA: 50 | 30 days supply | Qty: 45 | Fill #2

## 2020-09-18 MED FILL — BYSTOLIC 10 MG TABLET: 10 | 30 days supply | Qty: 30 | Fill #1

## 2020-09-18 MED FILL — DOXEPIN HCL 50 MG CAPS: 50 | 30 days supply | Qty: 30 | Fill #2

## 2020-09-18 MED FILL — METFORMIN HCL 500 MG TABS: 500 | 30 days supply | Qty: 60 | Fill #2

## 2020-09-21 ENCOUNTER — Other Ambulatory Visit: Payer: Self-pay

## 2020-09-21 ENCOUNTER — Telehealth (INDEPENDENT_AMBULATORY_CARE_PROVIDER_SITE_OTHER): Payer: No Payment, Other | Admitting: Psychiatry

## 2020-09-21 ENCOUNTER — Other Ambulatory Visit (HOSPITAL_COMMUNITY): Payer: Self-pay | Admitting: Psychiatry

## 2020-09-21 ENCOUNTER — Encounter (HOSPITAL_COMMUNITY): Payer: Self-pay | Admitting: Psychiatry

## 2020-09-21 DIAGNOSIS — F419 Anxiety disorder, unspecified: Secondary | ICD-10-CM | POA: Diagnosis not present

## 2020-09-21 DIAGNOSIS — G4700 Insomnia, unspecified: Secondary | ICD-10-CM | POA: Diagnosis not present

## 2020-09-21 DIAGNOSIS — F32A Depression, unspecified: Secondary | ICD-10-CM

## 2020-09-21 DIAGNOSIS — F411 Generalized anxiety disorder: Secondary | ICD-10-CM

## 2020-09-21 MED ORDER — DOXEPIN HCL 50 MG PO CAPS
50.0000 mg | ORAL_CAPSULE | Freq: Every day | ORAL | 2 refills | Status: DC
Start: 2020-09-21 — End: 2020-09-21

## 2020-09-21 MED ORDER — MIRTAZAPINE 30 MG PO TABS: 30.0000 mg | ORAL_TABLET | Freq: Every day | ORAL | 2 refills | Status: DC

## 2020-09-21 MED ORDER — BUSPIRONE HCL 15 MG PO TABS: ORAL_TABLET | ORAL | 2 refills | Status: DC

## 2020-09-21 MED ORDER — CLONAZEPAM 0.5 MG PO TABS: 0.5000 mg | ORAL_TABLET | Freq: Two times a day (BID) | ORAL | 2 refills | Status: DC | PRN

## 2020-09-21 MED ORDER — ESCITALOPRAM OXALATE 10 MG PO TABS: 30.0000 mg | ORAL_TABLET | Freq: Every day | ORAL | 2 refills | Status: DC

## 2020-09-21 MED FILL — MIRTAZAPINE 30 MG TABLET: 30 | 30 days supply | Qty: 30 | Fill #0

## 2020-09-21 MED FILL — ESCITALOPRAM 10 MG TABLET: 10 | 30 days supply | Qty: 90 | Fill #0

## 2020-09-21 NOTE — Progress Notes (Signed)
[facility] MD/PA/NP OP Progress Note  Virtual Visit via Telephone Note  I connected with [name] [name] on 09/21/20 at 10:20 AM EST by telephone and verified that I am speaking with the correct person using two identifiers.  Location: Patient: home Provider: Clinic   I discussed the limitations, risks, security and privacy concerns of performing an evaluation and management service by telephone and the availability of in person appointments. I also discussed with the patient that there may be a patient responsible charge related to this service. The patient expressed understanding and agreed to proceed.   I provided 15 minutes of non-face-to-face time during this encounter.      09/21/2020 10:02 AM [name] [name]  MRN:  [id number]  Chief Complaint: " My anxiety has calmed down some. I needed this for years."  HPI: Patient reported that she is feeling much better in terms of anxiety after she was started on clonazepam 0.5 mg twice daily.  She stated that she is very thankful to Careers information officer for that.  She stated that she is not as anxious and on edge all the time. She stated that she has been taking Lexapro 30 mg (higher than recommended dose), along with buspirone 15 mg 2 tablets in the morning, 2 tablets later in the day.  She has also been taking doxepin 50 mg at bedtime along with mirtazapine 30 mg.  She stated that she believes she needs both doxepin and mirtazapine as she sleeps really well with this combination.  She stated that adding clonazepam to this regimen was very helpful and she would like to keep on this combination the same way it is. She stated that she still lives by herself but is not as anxious and scared about her safety as she was. She mentioned that her son-in-law is currently admitted and underwent a major surgery for aneurysm of the brain.  She stated that she is praying for his recovery and that her daughter is under a lot of stress due to that. She denied any other concerns  today.  Writer confirmed the patient has not relapsed on alcohol or any other illicit substances.  She stated that she has no desire to use alcohol or any thing else anymore.  She has maintained her sobriety for 3+ years now.  Past Psychiatric History: history of substance use, crack cocaine addiction, alcohol use disorder  Past Medical History:  Past Medical History:  Diagnosis Date  . Anxiety    takes Lexapro daily  . Arthritis    "back, from neck down pass my bra area" (03/25/2017)  . Asthma   . Bartholin gland cyst 08/29/2011  . Bruises easily    pt is on Effient  . Chronic back pain    herniated nucleus pulposus  . Chronic back pain    "neck to bra area; lower back" (03/25/2017)  . COPD (chronic obstructive pulmonary disease) ([place])    early stages  . Coronary artery disease   . Depression    takes Klonopin daily  . Diabetes mellitus without complication ([place])   . Diverticulosis   . GERD (gastroesophageal reflux disease)    takes Nexium daily  . H/O hiatal hernia   . Heart attack ([place]) 2011  . Hemorrhoids   . Hernia   . Hyperlipidemia    takes Lipitor daily  . Hypertension    takes Losartan daily and Labetalol bid  . Hypothyroidism    takes Synthroid daily  . Insomnia    hydroxyzine prn  .  Joint pain   . Pneumonia    "couple times" (03/25/2017)  . Pre-diabetes    "just found out 1 wk ago" (03/25/2017)  . Psoriasis    elbows,knees,back  . Shortness of breath    with exertion  . Slowing of urinary stream   . Stress incontinence     Past Surgical History:  Procedure Laterality Date  . ABDOMINAL EXPLORATION SURGERY  1977  . ABDOMINAL HYSTERECTOMY  1977   "left one of my ovaries"  . BACK SURGERY    . BIOPSY  05/27/2019   Procedure: BIOPSY;  Surgeon: Daneil Dolin, MD;  [facility];  Service: Endoscopy;;  . COLONOSCOPY  2011   Dr. Ardis Hughs: Mild diverticulosis, descending diminutive colon polyp (not retrieved), next colonoscopy 10 years  .  CORONARY ANGIOPLASTY WITH STENT PLACEMENT  2011 X2   "regular stents didn't work; had to go back in in ~ 1 month and put in medicated stents"  . DILATION AND CURETTAGE OF UTERUS    . ESOPHAGOGASTRODUODENOSCOPY    . ESOPHAGOGASTRODUODENOSCOPY (EGD) WITH PROPOFOL N/A 05/27/2019   normal esophagus, dilation, erosive gastropathy s/p biopsy, normal duodenum. Negative H.pylori.   Marland Kitchen LEFT HEART CATH AND CORONARY ANGIOGRAPHY N/A 03/26/2017   Procedure: LEFT HEART CATH AND CORONARY ANGIOGRAPHY;  Surgeon: Belva Crome, MD;  Location: [facility];  Service: Cardiovascular;  Laterality: N/A;  . LUMBAR LAMINECTOMY/DECOMPRESSION MICRODISCECTOMY  08/05/2011   Procedure: LUMBAR LAMINECTOMY/DECOMPRESSION MICRODISCECTOMY;  Surgeon: Otilio Connors, MD;  Location: [facility];  Service: Neurosurgery;  Laterality: Right;  Right Lumbar four-five extraforaminal discectomy  . MALONEY DILATION N/A 05/27/2019   Procedure: Venia Minks DILATION;  Surgeon: Daneil Dolin, MD;  [facility]: [facility];  Service: Endoscopy;  Laterality: N/A;  54  . TONSILLECTOMY     as a child    Family Psychiatric History: See intake H&P for full details. Reviewed, with no updates at this time.   Family History:  Family History  Problem Relation Age of Onset  . Heart disease Mother   . Hypertension Mother   . Stroke Mother   . Mental illness Mother   . Heart disease Father   . Hypertension Father   . Diabetes Father   . Breast cancer Maternal Aunt   . Thyroid disease Paternal Aunt   . Heart disease Maternal Grandfather   . Anesthesia problems Daughter   . Cancer Paternal Grandmother        mouth  . Throat cancer Maternal Uncle   . Hypotension Neg Hx   . Malignant hyperthermia Neg Hx   . Pseudochol deficiency Neg Hx   . Colon cancer Neg Hx   . Stomach cancer Neg Hx   . Colon polyps Neg Hx   . Esophageal cancer Neg Hx   . Pancreatic cancer Neg Hx     Social History:  Social History   Socioeconomic History  .  Marital status: Divorced    Spouse name: Not on file  . Number of children: Not on file  . Years of education: Not on file  . Highest education level: Not on file  Occupational History  . Not on file  Tobacco Use  . Smoking status: Current Every Day Smoker    Packs/day: 1.00    Years: 50.00    Pack years: 50.00    Types: Cigarettes  . Smokeless tobacco: Never Used  Vaping Use  . Vaping Use: Never used  Substance and Sexual Activity  . Alcohol use:  Yes    Comment: once a week  . Drug use: No    Types: Cocaine    Comment: has been years per pt  . Sexual activity: Not Currently    Birth control/protection: Surgical  Other Topics Concern  . Not on file  Social History Narrative  . Not on file   Social Determinants of Health   Financial Resource Strain: Not on file  Food Insecurity: Not on file  Transportation Needs: Not on file  Physical Activity: Not on file  Stress: Not on file  Social Connections: Not on file    Allergies:  Allergies  Allergen Reactions  . Avocado Anaphylaxis  . Latex Shortness Of Breath and Rash  . Codeine Nausea Only    Metabolic Disorder Labs: Lab Results  Component Value Date   HGBA1C 6.0 06/15/2020   No results found for: PROLACTIN Lab Results  Component Value Date   CHOL 206 (H) 06/15/2020   TRIG 176 (H) 06/15/2020   HDL 57 06/15/2020   CHOLHDL 3.6 06/15/2020   VLDL 15 04/16/2016   LDLCALC 118 (H) 06/15/2020   LDLCALC 99 04/05/2019   Lab Results  Component Value Date   TSH 5.880 (H) 06/15/2020   TSH 0.352 (L) 04/05/2019    Therapeutic Level Labs: No results found for: LITHIUM No results found for: VALPROATE No components found for:  CBMZ  Current Medications: Current Outpatient Medications  Medication Sig Dispense Refill  . albuterol (VENTOLIN HFA) 108 (90 Base) MCG/ACT inhaler INHALE 2 PUFFS INTO THE LUNGS EVERY 6 (SIX) HOURS AS NEEDED FOR WHEEZING OR SHORTNESS OF BREATH. 18 g 2  . amLODipine (NORVASC) 5 MG tablet  Take 1 tablet (5 mg total) by mouth daily. 90 tablet 3  . aspirin EC 81 MG tablet Take 81 mg by mouth daily.    Marland Kitchen atorvastatin (LIPITOR) 80 MG tablet Take 1 tablet (80 mg total) by mouth daily. 30 tablet 6  . Blood Glucose Monitoring Suppl (TRUE METRIX METER) DEVI 1 kit by Does not apply route 4 (four) times daily. 1 Device 0  . busPIRone (BUSPAR) 15 MG tablet Take 2 tablets in the morning, 1 tab at noon and 1 tab in the evening 120 tablet 1  . cetirizine (ZYRTEC) 10 MG tablet Take 10 mg by mouth daily.    . Cholecalciferol (VITAMIN D) 50 MCG (2000 UT) CAPS Take 2,000 Units by mouth daily.    . clonazePAM (KLONOPIN) 0.5 MG tablet Take 1 tablet (0.5 mg total) by mouth 2 (two) times daily as needed for anxiety. 60 tablet 1  . cyclobenzaprine (FLEXERIL) 10 MG tablet Take 1 tablet (10 mg total) by mouth daily as needed. 30 tablet 4  . doxepin (SINEQUAN) 50 MG capsule Take 1 capsule (50 mg total) by mouth at bedtime. 30 capsule 2  . escitalopram (LEXAPRO) 10 MG tablet Take 3 tablets (30 mg total) by mouth daily. 30 tablet 2  . fluticasone furoate-vilanterol (BREO ELLIPTA) 200-25 MCG/INH AEPB Inhale 1 puff into the lungs daily. 60 each 6  . furosemide (LASIX) 20 MG tablet Take 1 tablet (20 mg total) by mouth daily. 30 tablet 6  . gabapentin (NEURONTIN) 600 MG tablet TAKE 1 TABLET BY MOUTH 4 TIMES DAILY 120 tablet 3  . glucose blood (TRUE METRIX BLOOD GLUCOSE TEST) test strip Use as instructed 100 each 12  . hydrocortisone (ANUSOL-HC) 2.5 % rectal cream Place 1 application rectally 2 (two) times daily. 30 g 0  . ipratropium-albuterol (DUONEB) 0.5-2.5 (3) MG/3ML  SOLN TAKE 3 MLS VIA NEBULIZATION EVERY 6 HOURS 360 mL 1  . levothyroxine (SYNTHROID) 100 MCG tablet Take 1 tablet (100 mcg total) by mouth daily. 30 tablet 6  . losartan (COZAAR) 50 MG tablet Take 1.5 tablets (75 mg total) by mouth daily. 45 tablet 6  . metFORMIN (GLUCOPHAGE) 500 MG tablet TAKE 1 TABLET (500 MG TOTAL) BY MOUTH 2 (TWO) TIMES DAILY  WITH A MEAL. 60 tablet 3  . montelukast (SINGULAIR) 10 MG tablet TAKE 1 TABLET BY MOUTH AT BEDTIME. 30 tablet 2  . nebivolol (BYSTOLIC) 10 MG tablet TAKE 1 TABLET (10 MG TOTAL) BY MOUTH DAILY. 90 tablet 4  . nitroGLYCERIN (NITROSTAT) 0.4 MG SL tablet Place 1 tablet (0.4 mg total) under the tongue every 5 (five) minutes as needed for chest pain. 90 tablet 3  . omeprazole (PRILOSEC) 40 MG capsule Take 1 capsule (40 mg total) by mouth 2 (two) times daily before a meal. 120 capsule 0  . ticagrelor (BRILINTA) 60 MG TABS tablet Take 1 tablet (60 mg total) by mouth 2 (two) times daily. 60 tablet 5  . triamcinolone cream (KENALOG) 0.1 % APPLY 1 APPLICATION TOPICALLY 2 TIMES DAILY. 30 g 1  . TRUEPLUS LANCETS 28G MISC 28 g by Does not apply route QID. 120 each 2  . umeclidinium bromide (INCRUSE ELLIPTA) 62.5 MCG/INH AEPB Inhale 1 puff into the lungs daily. 30 each 2  . Vitamin E 180 MG CAPS Take 180 mg by mouth daily.     No current facility-administered medications for this visit.    Musculoskeletal: Strength & Muscle Tone: within normal limits Gait & Station: normal Patient leans: N/A  Psychiatric Specialty Exam: ROS  There were no vitals taken for this visit.There is no height or weight on file to calculate BMI.  General Appearance: unable to assess due to phone visit  Eye Contact:  unable to assess due to phone visit  Speech:  Clear and Coherent and Normal Rate  Volume:  Normal  Mood:  Anxious  Affect:  Congruent  Thought Process:  Goal Directed and Descriptions of Associations: Intact  Orientation:  Full (Time, Place, and Person)  Thought Content: Logical   Suicidal Thoughts:  No  Homicidal Thoughts:  No  Memory:  Immediate;   Good  Judgement:  Fair  Insight:  Fair  Psychomotor Activity:  Normal  Concentration:  Concentration: Fair  Recall:  [employer] of Knowledge: Fair  Language: Fair  Akathisia:  Negative  Handed:  Right  AIMS (if indicated): not done  Assets:  Communication  Skills Desire for Improvement Housing  ADL's:  Intact  Cognition: WNL  Sleep:  Poor   Screenings: AUDIT   Flowsheet Row Admission (Discharged) from 07/10/2013 in [facility]  Alcohol Use Disorder Identification Test Final Score (AUDIT) 12    GAD-7   Flowsheet Row Office Visit from 06/15/2020 in [facility] from 01/20/2020 in [facility] Office Visit from 11/18/2019 in [facility] Visit from 09/06/2019 in [facility] Office Visit from 12/25/2017 in [facility]  Total GAD-7 Score 14 0 7 0 18    PHQ2-9   [facility] Office Visit from 06/15/2020 in [facility] from 01/20/2020 in [facility] Office Visit from 11/18/2019 in [facility] Visit from 09/06/2019 in [facility]  [facility] Visit from 03/25/2019 in [facility]  PHQ-2 Total Score 3 0 2 0 0  PHQ-9 Total Score 9 - 6 - 0       Assessment and Plan: Patient wants to continue the same regimen that she is on as she is doing better in terms of anxiety and mood now.  She is able to sleep well with a combination of doxepin and mirtazapine and does not want to discontinue that mirtazapine at this time.  1. Insomnia, unspecified type  - doxepin (SINEQUAN) 50 MG capsule; Take 1 capsule (50 mg total) by mouth at bedtime.  Dispense: 30 capsule; Refill: 2  2. Anxiety and depression  - busPIRone (BUSPAR) 15 MG tablet; Take 2 tablets in the morning, 1 tab at noon and 1 tab in the evening  Dispense: 120 tablet; Refill: 2 - escitalopram (LEXAPRO) 10 MG tablet; Take 3 tablets (30 mg total) by mouth daily.  Dispense: 90 tablet; Refill: 2  3. GAD (generalized anxiety disorder)  - busPIRone (BUSPAR) 15 MG tablet;  Take 2 tablets in the morning, 1 tab at noon and 1 tab in the evening  Dispense: 120 tablet; Refill: 2 - clonazePAM (KLONOPIN) 0.5 MG tablet; Take 1 tablet (0.5 mg total) by mouth 2 (two) times daily as needed for anxiety.  Dispense: 60 tablet; Refill: 2  Continue same regimen. F/up in 3 months. Continue individual therapy and palliative/hospice grief counselor   [place] Crane, MD 09/21/2020, 10:02 AM

## 2020-09-22 MED FILL — $BRILINTA 60 MG TABLET: 60 | 60 days supply | Qty: 120 | Fill #1

## 2020-09-28 ENCOUNTER — Telehealth: Payer: Self-pay

## 2020-09-28 NOTE — Telephone Encounter (Signed)
-----   Message from Timothy Lasso, RN sent at 09/19/2020 12:19 PM EST ----- Make sure pt calls to make endo colon appt

## 2020-09-28 NOTE — Telephone Encounter (Signed)
Letter sent to Dr Wynetta Emery in regards to holding Brilinta prior to endo colon.

## 2020-09-28 NOTE — Telephone Encounter (Signed)
Message sent to the pt to call if she would like to proceed with colon endo.  I verified that the pt does review her My Chart messages.

## 2020-09-29 ENCOUNTER — Other Ambulatory Visit: Payer: Self-pay | Admitting: Internal Medicine

## 2020-10-02 MED FILL — GABAPENTIN 600 MG TABLET: 600 | 30 days supply | Qty: 120 | Fill #0

## 2020-10-13 ENCOUNTER — Encounter: Payer: Self-pay | Admitting: Gastroenterology

## 2020-10-14 ENCOUNTER — Other Ambulatory Visit: Payer: Self-pay

## 2020-10-23 ENCOUNTER — Ambulatory Visit (HOSPITAL_COMMUNITY)
Admission: EM | Admit: 2020-10-23 | Discharge: 2020-10-23 | Disposition: A | Discharge status: Home / Self Care | Payer: Self-pay | Attending: Urgent Care | Admitting: Urgent Care

## 2020-10-23 ENCOUNTER — Ambulatory Visit (INDEPENDENT_AMBULATORY_CARE_PROVIDER_SITE_OTHER): Payer: Self-pay

## 2020-10-23 ENCOUNTER — Encounter (HOSPITAL_COMMUNITY): Payer: Self-pay

## 2020-10-23 ENCOUNTER — Other Ambulatory Visit: Payer: Self-pay

## 2020-10-23 DIAGNOSIS — R0602 Shortness of breath: Secondary | ICD-10-CM

## 2020-10-23 DIAGNOSIS — R059 Cough, unspecified: Secondary | ICD-10-CM

## 2020-10-23 DIAGNOSIS — J438 Other emphysema: Secondary | ICD-10-CM | POA: Insufficient documentation

## 2020-10-23 DIAGNOSIS — I252 Old myocardial infarction: Secondary | ICD-10-CM | POA: Insufficient documentation

## 2020-10-23 DIAGNOSIS — Z7982 Long term (current) use of aspirin: Secondary | ICD-10-CM | POA: Insufficient documentation

## 2020-10-23 DIAGNOSIS — Z7951 Long term (current) use of inhaled steroids: Secondary | ICD-10-CM | POA: Insufficient documentation

## 2020-10-23 DIAGNOSIS — F17210 Nicotine dependence, cigarettes, uncomplicated: Secondary | ICD-10-CM | POA: Insufficient documentation

## 2020-10-23 DIAGNOSIS — Z20822 Contact with and (suspected) exposure to covid-19: Secondary | ICD-10-CM | POA: Insufficient documentation

## 2020-10-23 DIAGNOSIS — J45909 Unspecified asthma, uncomplicated: Secondary | ICD-10-CM | POA: Insufficient documentation

## 2020-10-23 LAB — SARS CORONAVIRUS 2 (TAT 6-24 HRS): SARS Coronavirus 2: NEGATIVE

## 2020-10-23 MED ORDER — PROMETHAZINE-DM 6.25-15 MG/5ML PO SYRP
5.0000 mL | ORAL_SOLUTION | Freq: Every evening | ORAL | 0 refills | Status: DC | PRN
  Filled 2020-10-23: qty 100, 20d supply, fill #0

## 2020-10-23 MED ORDER — PREDNISONE 10 MG PO TABS
50.0000 mg | ORAL_TABLET | Freq: Every day | ORAL | 0 refills | Status: DC
  Filled 2020-10-23 (×2): qty 25, 5d supply, fill #0

## 2020-10-23 NOTE — ED Provider Notes (Signed)
[place]   MRN: [id number] DOB: 1955/12/03  Subjective:   [name] [name] is a 65 y.o. female presenting for 5 day history of dry hacking cough, nasal congestion, chest discomfort, nasal drainage. Has COPD, emphysema, chronic asthma. Denies fever, chest pain. Has a history of MI and has 2 stents. Has an albuterol inhaler and has been using this regularly.  No current facility-administered medications for this encounter.  Current Outpatient Medications:  .  albuterol (VENTOLIN HFA) 108 (90 Base) MCG/ACT inhaler, INHALE 2 PUFFS INTO THE LUNGS EVERY 6 (SIX) HOURS AS NEEDED FOR WHEEZING OR SHORTNESS OF BREATH., Disp: 18 g, Rfl: 2 .  amLODipine (NORVASC) 5 MG tablet, TAKE 1 TABLET (5 MG TOTAL) BY MOUTH DAILY., Disp: 90 tablet, Rfl: 3 .  aspirin EC 81 MG tablet, Take 81 mg by mouth daily., Disp: , Rfl:  .  atorvastatin (LIPITOR) 80 MG tablet, TAKE 1 TABLET (80 MG TOTAL) BY MOUTH DAILY., Disp: 30 tablet, Rfl: 6 .  Blood Glucose Monitoring Suppl (TRUE METRIX METER) DEVI, 1 kit by Does not apply route 4 (four) times daily., Disp: 1 Device, Rfl: 0 .  busPIRone (BUSPAR) 15 MG tablet, TAKE 2 TABLETS IN THE MORNING, 1 TAB AT NOON AND 1 TAB IN THE EVENING, Disp: 120 tablet, Rfl: 2 .  cetirizine (ZYRTEC) 10 MG tablet, Take 10 mg by mouth daily., Disp: , Rfl:  .  Cholecalciferol (VITAMIN D) 50 MCG (2000 UT) CAPS, Take 2,000 Units by mouth daily., Disp: , Rfl:  .  clonazePAM (KLONOPIN) 0.5 MG tablet, Take 1 tablet (0.5 mg total) by mouth 2 (two) times daily as needed for anxiety., Disp: 60 tablet, Rfl: 2 .  cyclobenzaprine (FLEXERIL) 10 MG tablet, TAKE 1 TABLET (10 MG TOTAL) BY MOUTH DAILY AS NEEDED., Disp: 30 tablet, Rfl: 4 .  doxepin (SINEQUAN) 50 MG capsule, TAKE 1 CAPSULE (50 MG TOTAL) BY MOUTH AT BEDTIME., Disp: 30 capsule, Rfl: 2 .  escitalopram (LEXAPRO) 10 MG tablet, TAKE 3 TABLETS (30 MG TOTAL) BY MOUTH DAILY., Disp: 90 tablet, Rfl: 2 .  fluticasone furoate-vilanterol (BREO  ELLIPTA) 200-25 MCG/INH AEPB, Inhale 1 puff into the lungs daily., Disp: 60 each, Rfl: 6 .  furosemide (LASIX) 20 MG tablet, Take 1 tablet (20 mg total) by mouth daily., Disp: 30 tablet, Rfl: 6 .  gabapentin (NEURONTIN) 600 MG tablet, TAKE 1 TABLET BY MOUTH 4 TIMES DAILY, Disp: 360 tablet, Rfl: 1 .  glucose blood (TRUE METRIX BLOOD GLUCOSE TEST) test strip, Use as instructed, Disp: 100 each, Rfl: 12 .  hydrocortisone (ANUSOL-HC) 2.5 % rectal cream, PLACE 1 APPLICATION RECTALLY 2 (TWO) TIMES DAILY., Disp: 30 g, Rfl: 0 .  ipratropium-albuterol (DUONEB) 0.5-2.5 (3) MG/3ML SOLN, TAKE 3 MLS VIA NEBULIZATION EVERY 6 HOURS, Disp: 360 mL, Rfl: 1 .  levothyroxine (SYNTHROID) 100 MCG tablet, TAKE 1 TABLET (100 MCG TOTAL) BY MOUTH DAILY., Disp: 30 tablet, Rfl: 6 .  losartan (COZAAR) 50 MG tablet, TAKE 1 & 1/2 TABLETS BY MOUTH DAILY., Disp: 45 tablet, Rfl: 6 .  metFORMIN (GLUCOPHAGE) 500 MG tablet, TAKE 1 TABLET (500 MG TOTAL) BY MOUTH 2 (TWO) TIMES DAILY WITH A MEAL., Disp: 60 tablet, Rfl: 3 .  mirtazapine (REMERON) 30 MG tablet, TAKE 1 TABLET (30 MG TOTAL) BY MOUTH AT BEDTIME., Disp: 30 tablet, Rfl: 2 .  montelukast (SINGULAIR) 10 MG tablet, TAKE 1 TABLET BY MOUTH AT BEDTIME., Disp: 30 tablet, Rfl: 2 .  nebivolol (BYSTOLIC) 10 MG tablet, TAKE 1 TABLET (  10 MG TOTAL) BY MOUTH DAILY., Disp: 90 tablet, Rfl: 4 .  nitroGLYCERIN (NITROSTAT) 0.4 MG SL tablet, Place 1 tablet (0.4 mg total) under the tongue every 5 (five) minutes as needed for chest pain., Disp: 90 tablet, Rfl: 3 .  omeprazole (PRILOSEC) 40 MG capsule, TAKE 1 CAPSULE (40 MG TOTAL) BY MOUTH 2 (TWO) TIMES DAILY BEFORE A MEAL., Disp: 120 capsule, Rfl: 0 .  ticagrelor (BRILINTA) 60 MG TABS tablet, TAKE 1 TABLET (60 MG TOTAL) BY MOUTH 2 (TWO) TIMES DAILY., Disp: 60 tablet, Rfl: 5 .  triamcinolone cream (KENALOG) 0.1 %, APPLY 1 APPLICATION TOPICALLY 2 TIMES DAILY., Disp: 30 g, Rfl: 1 .  TRUEPLUS LANCETS 28G MISC, 28 g by Does not apply route QID., Disp: 120  each, Rfl: 2 .  umeclidinium bromide (INCRUSE ELLIPTA) 62.5 MCG/INH AEPB, INHALE 1 PUFF INTO THE LUNGS DAILY., Disp: 30 each, Rfl: 2 .  Vitamin E 180 MG CAPS, Take 180 mg by mouth daily., Disp: , Rfl:    Allergies  Allergen Reactions  . Avocado Anaphylaxis  . Latex Shortness Of Breath and Rash  . Codeine Nausea Only    Past Medical History:  Diagnosis Date  . Anxiety    takes Lexapro daily  . Arthritis    "back, from neck down pass my bra area" (03/25/2017)  . Asthma   . Bartholin gland cyst 08/29/2011  . Bruises easily    pt is on Effient  . Chronic back pain    herniated nucleus pulposus  . Chronic back pain    "neck to bra area; lower back" (03/25/2017)  . COPD (chronic obstructive pulmonary disease) ([place])    early stages  . Coronary artery disease   . Depression    takes Klonopin daily  . Diabetes mellitus without complication ([place])   . Diverticulosis   . GERD (gastroesophageal reflux disease)    takes Nexium daily  . H/O hiatal hernia   . Heart attack ([place]) 2011  . Hemorrhoids   . Hernia   . Hyperlipidemia    takes Lipitor daily  . Hypertension    takes Losartan daily and Labetalol bid  . Hypothyroidism    takes Synthroid daily  . Insomnia    hydroxyzine prn  . Joint pain   . Pneumonia    "couple times" (03/25/2017)  . Pre-diabetes    "just found out 1 wk ago" (03/25/2017)  . Psoriasis    elbows,knees,back  . Shortness of breath    with exertion  . Slowing of urinary stream   . Stress incontinence      Past Surgical History:  Procedure Laterality Date  . ABDOMINAL EXPLORATION SURGERY  1977  . ABDOMINAL HYSTERECTOMY  1977   "left one of my ovaries"  . BACK SURGERY    . BIOPSY  05/27/2019   Procedure: BIOPSY;  Surgeon: Daneil Dolin, MD;  Location: [facility];  Service: Endoscopy;;  . COLONOSCOPY  2011   Dr. Ardis Hughs: Mild diverticulosis, descending diminutive colon polyp (not retrieved), next colonoscopy 10 years  . CORONARY ANGIOPLASTY WITH  STENT PLACEMENT  2011 X2   "regular stents didn't work; had to go back in in ~ 1 month and put in medicated stents"  . DILATION AND CURETTAGE OF UTERUS    . ESOPHAGOGASTRODUODENOSCOPY    . ESOPHAGOGASTRODUODENOSCOPY (EGD) WITH PROPOFOL N/A 05/27/2019   normal esophagus, dilation, erosive gastropathy s/p biopsy, normal duodenum. Negative H.pylori.   Marland Kitchen LEFT HEART CATH AND CORONARY ANGIOGRAPHY N/A 03/26/2017  Procedure: LEFT HEART CATH AND CORONARY ANGIOGRAPHY;  Surgeon: Lyn Records, MD;  [facility] INVAS[facility] CV [facility];  Service: Cardiovascular;  Laterality: N/A;  . LUMBAR LAMINECTOMY/DECOMPRESSION MICRODISCECTOMY  08/05/2011   Procedure: LUMBAR LAMINECTOMY/DECOMPRESSION MICRODISCECTOMY;  Surgeon: Clydene Fake, MD;  Location: [facility] NEURO ORS;  Service: Neurosurgery;  Laterality: Right;  Right Lumbar four-five extraforaminal discectomy  . MALONEY DILATION N/A 05/27/2019   Procedure: Elease Hashimoto DILATION;  Surgeon: Corbin Ade, MD;  Location: [facility];  Service: Endoscopy;  Laterality: N/A;  54  . TONSILLECTOMY     as a child    Family History  Problem Relation Age of Onset  . Heart disease Mother   . Hypertension Mother   . Stroke Mother   . Mental illness Mother   . Heart disease Father   . Hypertension Father   . Diabetes Father   . Breast cancer Maternal Aunt   . Thyroid disease Paternal Aunt   . Heart disease Maternal Grandfather   . Anesthesia problems Daughter   . Cancer Paternal Grandmother        mouth  . Throat cancer Maternal Uncle   . Hypotension Neg Hx   . Malignant hyperthermia Neg Hx   . Pseudochol deficiency Neg Hx   . Colon cancer Neg Hx   . Stomach cancer Neg Hx   . Colon polyps Neg Hx   . Esophageal cancer Neg Hx   . Pancreatic cancer Neg Hx     Social History   Tobacco Use  . Smoking status: Current Every Day Smoker    Packs/day: 1.00    Years: 50.00    Pack years: 50.00    Types: Cigarettes  . Smokeless tobacco: Never Used  Vaping Use  . Vaping  Use: Never used  Substance Use Topics  . Alcohol use: Yes    Comment: once a week  . Drug use: No    Types: Cocaine    Comment: has been years per pt    ROS   Objective:   Vitals: BP (!) 150/75 (BP Location: Right Arm)   Pulse 65   Temp 98.2 F (36.8 C) (Oral)   Resp 18   SpO2 100%   Physical Exam Constitutional:      General: She is not in acute distress.    Appearance: Normal appearance. She is well-developed. She is not ill-appearing, toxic-appearing or diaphoretic.  HENT:     Head: Normocephalic and atraumatic.     Right Ear: External ear normal.     Left Ear: External ear normal.     Nose: Nose normal.     Mouth/Throat:     Mouth: Mucous membranes are moist.  Eyes:     General: No scleral icterus.       Right eye: No discharge.        Left eye: No discharge.     Extraocular Movements: Extraocular movements intact.     Conjunctiva/sclera: Conjunctivae normal.     Pupils: Pupils are equal, round, and reactive to light.  Cardiovascular:     Rate and Rhythm: Normal rate and regular rhythm.     Pulses: Normal pulses.     Heart sounds: Normal heart sounds. No murmur heard. No friction rub. No gallop.   Pulmonary:     Effort: Pulmonary effort is normal. No respiratory distress.     Breath sounds: No stridor. Wheezing (throughout) and rhonchi (throughout) present. No rales.  Skin:    General: Skin is warm and dry.  Findings: No rash.  Neurological:     Mental Status: She is alert and oriented to person, place, and time.  Psychiatric:        Mood and Affect: Mood normal.        Behavior: Behavior normal.        Thought Content: Thought content normal.        Judgment: Judgment normal.    DG Chest 2 View  Result Date: 10/23/2020 CLINICAL DATA:  Cough EXAM: CHEST - 2 VIEW COMPARISON:  05/21/2020 FINDINGS: The heart size and mediastinal contours are within normal limits. Atherosclerotic calcification of the aortic knob. No focal airspace consolidation,  pleural effusion, or pneumothorax. The visualized skeletal structures are unremarkable. IMPRESSION: No active cardiopulmonary disease. Electronically Signed   By: Davina Poke D.O.   On: 10/23/2020 14:34    Assessment and Plan :   PDMP not reviewed this encounter.  1. Other emphysema ([place])   2. Chronic asthma without complication, unspecified asthma severity, unspecified whether persistent   3. Cough     Recommended an oral prednisone course given her history of chronic rhinitis, chronic asthma and emphysema.  Offered cough suppression medication.  Recommended scheduling her albuterol.  No signs of respiratory distress including accessory muscle use, pursed or cyanotic lips, tachypnea.  Counseled patient on potential for adverse effects with medications prescribed/recommended today, ER and return-to-clinic precautions discussed, patient verbalized understanding.    Jaynee Eagles, PA-C 10/23/20 1456

## 2020-10-23 NOTE — ED Triage Notes (Signed)
Pt presents with bilateral ear fullness,  non productive cough, congestion, chest discomfort, and nasal drainage since Wednesday.  Pt has Hx of COPD and Asthma

## 2020-10-25 ENCOUNTER — Other Ambulatory Visit: Payer: Self-pay | Admitting: Internal Medicine

## 2020-10-25 ENCOUNTER — Other Ambulatory Visit: Payer: Self-pay

## 2020-10-25 DIAGNOSIS — K219 Gastro-esophageal reflux disease without esophagitis: Secondary | ICD-10-CM

## 2020-10-25 MED ORDER — BREO ELLIPTA 200-25 MCG/INH IN AEPB: INHALATION_SPRAY | RESPIRATORY_TRACT | 2 refills | Status: AC

## 2020-10-25 MED ORDER — OMEPRAZOLE 40 MG PO CPDR
DELAYED_RELEASE_CAPSULE | ORAL | 0 refills | Status: DC
  Filled 2020-10-25: qty 60, 30d supply, fill #0
  Filled 2020-11-29: qty 60, 30d supply, fill #1

## 2020-10-25 MED FILL — Amlodipine Besylate Tab 5 MG (Base Equivalent): ORAL | 30 days supply | Qty: 30 | Fill #0 | Status: AC

## 2020-10-25 MED FILL — Mirtazapine Tab 30 MG: ORAL | 30 days supply | Qty: 30 | Fill #0 | Status: AC

## 2020-10-25 MED FILL — Ticagrelor Tab 60 MG: ORAL | 30 days supply | Qty: 60 | Fill #0 | Status: CN

## 2020-10-25 MED FILL — Atorvastatin Calcium Tab 80 MG (Base Equivalent): ORAL | 30 days supply | Qty: 30 | Fill #0 | Status: AC

## 2020-10-25 MED FILL — Doxepin HCl Cap 50 MG: ORAL | 30 days supply | Qty: 30 | Fill #0 | Status: AC

## 2020-10-25 MED FILL — Levothyroxine Sodium Tab 100 MCG: ORAL | 30 days supply | Qty: 30 | Fill #0 | Status: AC

## 2020-10-25 MED FILL — Montelukast Sodium Tab 10 MG (Base Equiv): ORAL | 30 days supply | Qty: 30 | Fill #0 | Status: AC

## 2020-10-25 MED FILL — Ticagrelor Tab 60 MG: ORAL | 30 days supply | Qty: 60 | Fill #0 | Status: AC

## 2020-10-25 MED FILL — Nebivolol HCl Tab 10 MG (Base Equivalent): ORAL | 30 days supply | Qty: 30 | Fill #0 | Status: CN

## 2020-10-25 MED FILL — Nebivolol HCl Tab 10 MG (Base Equivalent): ORAL | 90 days supply | Qty: 90 | Fill #0 | Status: AC

## 2020-10-26 ENCOUNTER — Other Ambulatory Visit: Payer: Self-pay

## 2020-11-06 ENCOUNTER — Encounter: Payer: Self-pay | Admitting: Internal Medicine

## 2020-11-07 ENCOUNTER — Ambulatory Visit: Payer: Self-pay | Admitting: Critical Care Medicine

## 2020-11-07 ENCOUNTER — Other Ambulatory Visit: Payer: Self-pay

## 2020-11-07 VITALS — BP 129/78 | HR 78 | Temp 98.2°F | Resp 18 | Ht 61.0 in | Wt 137.0 lb

## 2020-11-07 DIAGNOSIS — J441 Chronic obstructive pulmonary disease with (acute) exacerbation: Secondary | ICD-10-CM

## 2020-11-07 DIAGNOSIS — J439 Emphysema, unspecified: Secondary | ICD-10-CM

## 2020-11-07 DIAGNOSIS — N3000 Acute cystitis without hematuria: Secondary | ICD-10-CM | POA: Insufficient documentation

## 2020-11-07 DIAGNOSIS — R300 Dysuria: Secondary | ICD-10-CM

## 2020-11-07 LAB — POCT URINALYSIS DIP (CLINITEK)
Bilirubin, UA: NEGATIVE
Blood, UA: NEGATIVE
Glucose, UA: NEGATIVE mg/dL
Ketones, POC UA: NEGATIVE mg/dL
Nitrite, UA: NEGATIVE
POC PROTEIN,UA: NEGATIVE
Spec Grav, UA: 1.015 (ref 1.010–1.025)
Urobilinogen, UA: 0.2 E.U./dL
pH, UA: 6 (ref 5.0–8.0)

## 2020-11-07 MED ORDER — TRELEGY ELLIPTA 100-62.5-25 MCG/INH IN AEPB
1.0000 | INHALATION_SPRAY | Freq: Every day | RESPIRATORY_TRACT | 4 refills | Status: DC
  Filled 2020-11-07: qty 60, 30d supply, fill #0
  Filled 2020-11-29: qty 60, 30d supply, fill #1
  Filled 2021-01-22: qty 60, 30d supply, fill #2
  Filled 2021-02-21: qty 60, 30d supply, fill #3

## 2020-11-07 MED ORDER — SULFAMETHOXAZOLE-TRIMETHOPRIM 800-160 MG PO TABS
1.0000 | ORAL_TABLET | Freq: Two times a day (BID) | ORAL | 0 refills | Status: DC
  Filled 2020-11-07: qty 10, 5d supply, fill #0

## 2020-11-07 NOTE — Assessment & Plan Note (Signed)
COPD exacerbation for 4 to 5 days this will give Bactrim twice daily  Will switch to Trelegy and get patient assistance

## 2020-11-07 NOTE — Progress Notes (Signed)
Subjective:    Patient ID: [name] [name], female    DOB: 05/20/1956, 65 y.o.   MRN: [id number]  This is a 65 year old female previous history of COPD hypertension coronary disease asthma reflux esophageal dysphagia hypothyroidism and today comes with dysuria with cloudy urine and elevated leukocytes on UA  Patient comes to the mobile health unit for assessment.  She states she said increased thick mucus difficult to raise that is thick and yellow she has increased wheezing increased shortness of breath chest tightness  She also has pain on urination and suprapubic pain as well.  Past Medical History:  Diagnosis Date  . Anxiety    takes Lexapro daily  . Arthritis    "back, from neck down pass my bra area" (03/25/2017)  . Asthma   . Bartholin gland cyst 08/29/2011  . Bruises easily    pt is on Effient  . Chronic back pain    herniated nucleus pulposus  . Chronic back pain    "neck to bra area; lower back" (03/25/2017)  . COPD (chronic obstructive pulmonary disease) ([place])    early stages  . Coronary artery disease   . Depression    takes Klonopin daily  . Diabetes mellitus without complication ([place])   . Diverticulosis   . GERD (gastroesophageal reflux disease)    takes Nexium daily  . H/O hiatal hernia   . Heart attack ([place]) 2011  . Hemorrhoids   . Hernia   . Hyperlipidemia    takes Lipitor daily  . Hypertension    takes Losartan daily and Labetalol bid  . Hypothyroidism    takes Synthroid daily  . Insomnia    hydroxyzine prn  . Joint pain   . Pneumonia    "couple times" (03/25/2017)  . Pre-diabetes    "just found out 1 wk ago" (03/25/2017)  . Psoriasis    elbows,knees,back  . Shortness of breath    with exertion  . Slowing of urinary stream   . Stress incontinence      Family History  Problem Relation Age of Onset  . Heart disease Mother   . Hypertension Mother   . Stroke Mother   . Mental illness Mother   . Heart disease Father   . Hypertension Father    . Diabetes Father   . Breast cancer Maternal Aunt   . Thyroid disease Paternal Aunt   . Heart disease Maternal Grandfather   . Anesthesia problems Daughter   . Cancer Paternal Grandmother        mouth  . Throat cancer Maternal Uncle   . Hypotension Neg Hx   . Malignant hyperthermia Neg Hx   . Pseudochol deficiency Neg Hx   . Colon cancer Neg Hx   . Stomach cancer Neg Hx   . Colon polyps Neg Hx   . Esophageal cancer Neg Hx   . Pancreatic cancer Neg Hx      Social History   Socioeconomic History  . Marital status: Divorced    Spouse name: Not on file  . Number of children: Not on file  . Years of education: Not on file  . Highest education level: Not on file  Occupational History  . Not on file  Tobacco Use  . Smoking status: Current Every Day Smoker    Packs/day: 1.00    Years: 50.00    Pack years: 50.00    Types: Cigarettes  . Smokeless tobacco: Never Used  Vaping Use  . Vaping Use: Never used  Substance and Sexual Activity  . Alcohol use: Yes    Comment: once a week  . Drug use: No    Types: Cocaine    Comment: has been years per pt  . Sexual activity: Not Currently    Birth control/protection: Surgical  Other Topics Concern  . Not on file  Social History Narrative  . Not on file   Social Determinants of Health   Financial Resource Strain: Not on file  Food Insecurity: Not on file  Transportation Needs: Not on file  Physical Activity: Not on file  Stress: Not on file  Social Connections: Not on file  Intimate Partner Violence: Not on file     Allergies  Allergen Reactions  . Avocado Anaphylaxis  . Latex Shortness Of Breath and Rash  . Codeine Nausea Only     Outpatient Medications Prior to Visit  Medication Sig Dispense Refill  . albuterol (VENTOLIN HFA) 108 (90 Base) MCG/ACT inhaler INHALE 2 PUFFS INTO THE LUNGS EVERY 6 (SIX) HOURS AS NEEDED FOR WHEEZING OR SHORTNESS OF BREATH. 18 g 2  . amLODipine (NORVASC) 5 MG tablet TAKE 1 TABLET (5 MG  TOTAL) BY MOUTH DAILY. 90 tablet 3  . aspirin EC 81 MG tablet Take 81 mg by mouth daily.    Marland Kitchen atorvastatin (LIPITOR) 80 MG tablet TAKE 1 TABLET (80 MG TOTAL) BY MOUTH DAILY. 30 tablet 6  . Blood Glucose Monitoring Suppl (TRUE METRIX METER) DEVI 1 kit by Does not apply route 4 (four) times daily. 1 Device 0  . busPIRone (BUSPAR) 15 MG tablet TAKE 2 TABLETS IN THE MORNING, 1 TAB AT NOON AND 1 TAB IN THE EVENING 120 tablet 2  . cetirizine (ZYRTEC) 10 MG tablet Take 10 mg by mouth daily.    . Cholecalciferol (VITAMIN D) 50 MCG (2000 UT) CAPS Take 2,000 Units by mouth daily.    . clonazePAM (KLONOPIN) 0.5 MG tablet Take 1 tablet (0.5 mg total) by mouth 2 (two) times daily as needed for anxiety. 60 tablet 2  . cyclobenzaprine (FLEXERIL) 10 MG tablet TAKE 1 TABLET (10 MG TOTAL) BY MOUTH DAILY AS NEEDED. 30 tablet 4  . doxepin (SINEQUAN) 50 MG capsule TAKE 1 CAPSULE (50 MG TOTAL) BY MOUTH AT BEDTIME. 30 capsule 2  . escitalopram (LEXAPRO) 10 MG tablet TAKE 3 TABLETS (30 MG TOTAL) BY MOUTH DAILY. 90 tablet 2  . furosemide (LASIX) 20 MG tablet Take 1 tablet (20 mg total) by mouth daily. 30 tablet 6  . gabapentin (NEURONTIN) 600 MG tablet TAKE 1 TABLET BY MOUTH 4 TIMES DAILY 360 tablet 1  . glucose blood (TRUE METRIX BLOOD GLUCOSE TEST) test strip Use as instructed 100 each 12  . hydrocortisone (ANUSOL-HC) 2.5 % rectal cream PLACE 1 APPLICATION RECTALLY 2 (TWO) TIMES DAILY. 30 g 0  . ipratropium-albuterol (DUONEB) 0.5-2.5 (3) MG/3ML SOLN TAKE 3 MLS VIA NEBULIZATION EVERY 6 HOURS 360 mL 1  . levothyroxine (SYNTHROID) 100 MCG tablet TAKE 1 TABLET (100 MCG TOTAL) BY MOUTH DAILY. 30 tablet 6  . losartan (COZAAR) 50 MG tablet TAKE 1 & 1/2 TABLETS BY MOUTH DAILY. 45 tablet 6  . metFORMIN (GLUCOPHAGE) 500 MG tablet TAKE 1 TABLET (500 MG TOTAL) BY MOUTH 2 (TWO) TIMES DAILY WITH A MEAL. 60 tablet 3  . mirtazapine (REMERON) 30 MG tablet TAKE 1 TABLET (30 MG TOTAL) BY MOUTH AT BEDTIME. 30 tablet 2  . montelukast  (SINGULAIR) 10 MG tablet TAKE 1 TABLET BY MOUTH AT BEDTIME. 30 tablet 2  .  nebivolol (BYSTOLIC) 10 MG tablet TAKE 1 TABLET (10 MG TOTAL) BY MOUTH DAILY. 90 tablet 4  . omeprazole (PRILOSEC) 40 MG capsule TAKE 1 CAPSULE (40 MG TOTAL) BY MOUTH 2 (TWO) TIMES DAILY BEFORE A MEAL. 120 capsule 0  . ticagrelor (BRILINTA) 60 MG TABS tablet TAKE 1 TABLET (60 MG TOTAL) BY MOUTH 2 (TWO) TIMES DAILY. 60 tablet 5  . triamcinolone cream (KENALOG) 0.1 % APPLY 1 APPLICATION TOPICALLY 2 TIMES DAILY. 30 g 1  . TRUEPLUS LANCETS 28G MISC 28 g by Does not apply route QID. 120 each 2  . Vitamin E 180 MG CAPS Take 180 mg by mouth daily.    Marland Kitchen umeclidinium bromide (INCRUSE ELLIPTA) 62.5 MCG/INH AEPB INHALE 1 PUFF INTO THE LUNGS DAILY. 30 each 2  . nitroGLYCERIN (NITROSTAT) 0.4 MG SL tablet Place 1 tablet (0.4 mg total) under the tongue every 5 (five) minutes as needed for chest pain. 90 tablet 3  . predniSONE (DELTASONE) 10 MG tablet Take 5 tablets (50 mg total) by mouth daily with breakfast. (Patient not taking: Reported on 11/07/2020) 25 tablet 0  . promethazine-dextromethorphan (PROMETHAZINE-DM) 6.25-15 MG/5ML syrup Take 5 mLs by mouth at bedtime as needed for cough. (Patient not taking: Reported on 11/07/2020) 100 mL 0  . fluticasone furoate-vilanterol (BREO ELLIPTA) 200-25 MCG/INH AEPB Inhale 1 puff into the lungs daily. (Patient not taking: Reported on 11/07/2020) 60 each 6   No facility-administered medications prior to visit.     Review of Systems  HENT: Negative.   Respiratory: Positive for cough, chest tightness, shortness of breath and wheezing.   Cardiovascular: Negative.   Gastrointestinal: Negative.   Genitourinary: Positive for difficulty urinating, dysuria, flank pain, frequency and urgency. Negative for hematuria.       Objective:   Physical Exam Vitals:   11/07/20 1601  BP: 129/78  Pulse: 78  Resp: 18  Temp: 98.2 F (36.8 C)  TempSrc: Oral  SpO2: 94%  Weight: 137 lb (62.1 kg)  Height: 5'  1" (1.549 m)    Gen: Pleasant, well-nourished, in no distress,  normal affect  ENT: No lesions,  mouth clear,  oropharynx clear, no postnasal drip  Neck: No JVD, no TMG, no carotid bruits  Lungs: No use of accessory muscles, no dullness to percussion, distant breath sounds expired wheezes  Cardiovascular: RRR, heart sounds normal, no murmur or gallops, no peripheral edema  Abdomen: soft and NT, no HSM,  BS normal  Musculoskeletal: No deformities, no cyanosis or clubbing  Neuro: alert, non focal  Skin: Warm, no lesions or rashes        Assessment & Plan:  I personally reviewed all images and lab data in the [facility] system as well as any outside material available during this office visit and agree with the  radiology impressions.   COPD exacerbation ([place]) COPD exacerbation for 4 to 5 days this will give Bactrim twice daily  Will switch to Trelegy and get patient assistance  Acute cystitis without hematuria Plan urine culture and Bactrim for 5 days   Tanyah was seen today for dysuria.  Diagnoses and all orders for this visit:  Acute cystitis without hematuria  Dysuria -     Urine Culture -     POCT URINALYSIS DIP (CLINITEK)  Pulmonary emphysema, unspecified emphysema type (HCC)  COPD exacerbation (HCC)  Other orders -     Fluticasone-Umeclidin-Vilant (TRELEGY ELLIPTA) 100-62.5-25 MCG/INH AEPB; Inhale 1 puff into the lungs daily. -     sulfamethoxazole-trimethoprim (BACTRIM DS) 800-160  MG tablet; Take 1 tablet by mouth 2 (two) times daily.

## 2020-11-07 NOTE — Assessment & Plan Note (Signed)
Plan urine culture and Bactrim for 5 days

## 2020-11-07 NOTE — Assessment & Plan Note (Signed)
" >>  ASSESSMENT AND PLAN FOR COPD EXACERBATION (HCC) WRITTEN ON 11/07/2020  6:19 PM BY Kiosha Buchan E, MD  COPD exacerbation for 4 to 5 days this will give Bactrim  twice daily  Will switch to Trelegy and get patient assistance "

## 2020-11-07 NOTE — Progress Notes (Signed)
Patient reports urine concerns. Patient complains of COPD flare up and still having trouble breathing.

## 2020-11-07 NOTE — Patient Instructions (Signed)
Start Bactrim 1 twice daily for 5 days, [facility]  Start Trelegy 1 inhalation daily and discontinue Breo and Incruse, patient assistance will be needed this [facility]  Urine culture was sent   Return to see Dr. Joya Gaskins in 6 weeks at the clinic for pulmonary follow-up  Fluticasone; Umeclidinium; Vilanterol inhalation powder What is this medicine? FLUTICASONE; UMECLIDINIUM; VILANTEROL (floo TIK a sone; ue MEK li DIN ee um; vye LAN ter ol) inhalation is a combination of 3 drugs to treat COPD and asthma. Umeclidinium and Vilanterol are bronchodilators that help keep airways open. Fluticasone decreases inflammation in the lungs. Do not use this drug combination for acute asthma attacks or bronchospasm. This medicine may be used for other purposes; ask your health care provider or pharmacist if you have questions. COMMON BRAND NAME(S): TRELEGY ELLIPTA What should I tell my health care provider before I take this medicine? They need to know if you have any of these conditions:  bone problems  diabetes  eye disease, vision problems  heart disease  high blood pressure  history of irregular heartbeat  immune system problems  infection  kidney disease  pheochromocytoma  prostate disease  seizures  thyroid disease  trouble passing urine  an unusual or allergic reaction to fluticasone, umeclidinium, vilanterol, lactose, milk proteins, other medicines, foods, dyes, or preservatives  pregnant or trying to get pregnant  breast-feeding How should I use this medicine? This drug is inhaled through the mouth. Rinse your mouth with water after use. Make sure not to swallow the water. Take it as directed on the prescription label at the same time every day. Do not use it more often than directed. A special MedGuide will be given to you by the pharmacist with each prescription and  refill. Be sure to read this information carefully each time. Talk to your pediatrician about the use of this drug in children. Special care may be needed. Overdosage: If you think you have taken too much of this medicine contact a poison control center or emergency room at once. NOTE: This medicine is only for you. Do not share this medicine with others. What if I miss a dose? If you miss a dose, take it as soon as you can. If it is almost time for your next dose, take only that dose. Do not take double or extra doses. What may interact with this medicine? Do not take this medicine with any of the following medications:  cisapride  dofetilide  dronedarone  MAOIs like Carbex, Eldepryl, Marplan, Nardil, and Parnate  pimozide  thioridazine  ziprasidone This medicine may also interact with the following medications:  aclidinium  antihistamines for allergy  antiviral medicines for HIV or AIDS  atropine  beta-blockers like metoprolol and propranolol  certain antibiotics like clarithromycin and telithromycin  certain medicines for bladder problems like oxybutynin, tolterodine  certain medicines for depression, anxiety, or psychotic disturbances  certain medicines for fungal infections like ketoconazole, itraconazole, posaconazole, voriconazole  certain medicines for Parkinson's disease like benztropine, trihexyphenidyl  certain medicines for stomach problems like dicyclomine, hyoscyamine  certain medicines for travel sickness like scopolamine  conivaptan  diuretics  ipratropium  medicines for colds  other medicines for breathing problems  other medicines that prolong the QT interval (cause an abnormal heart rhythm)  nefazodone  tiotropium This list may not describe all possible interactions. Give your health care provider a list of  all the medicines, herbs, non-prescription drugs, or dietary supplements you use. Also tell them if you smoke, drink alcohol, or  use illegal drugs. Some items may interact with your medicine. What should I watch for while using this medicine? Visit your doctor or health care professional for regular checkups. Tell your doctor or health care professional if your symptoms do not get better. Do not use this medicine more than once every 24 hours. NEVER use this medicine for an acute asthma or COPD attack. You should use your short-acting rescue inhalers for this purpose. If your symptoms get worse or if you need your short-acting inhalers more often, call your doctor right away. If you are going to have surgery tell your doctor or health care professional that you are using this medicine. Try not to come in contact with people with the chicken pox or measles. If you do, call your doctor. This medicine may increase blood sugar. Ask your healthcare provider if changes in diet or medicines are needed if you have diabetes. What side effects may I notice from receiving this medicine? Side effects that you should report to your doctor or health care professional as soon as possible:  allergic reactions like skin rash or hives, swelling of the face, lips, or tongue  breathing problems right after inhaling your medicine  chest pain  eye pain  fast, irregular heartbeat  feeling faint or lightheaded, falls  fever or chills  nausea, vomiting  signs and symptoms of high blood sugar such as being more thirsty or hungry or having to urinate more than normal. You may also feel very tired or have blurry vision.  trouble passing urine Side effects that usually do not require medical attention (report these to your doctor or health care professional if they continue or are bothersome):  back pain  changes in taste  cough  diarrhea  headache  nervousness  sore throat  tremor This list may not describe all possible side effects. Call your doctor for medical advice about side effects. You may report side effects to FDA at  1-800-FDA-1088. Where should I keep my medicine? Keep out of the reach of children and pets. Store at room temperature between 20 and 25 degrees C (68 and 77 degrees F). Keep inhaler away from extreme heat, cold or humidity. Throw away 6 weeks after removing it from the foil pouch, when the dose counter reads "0" or after the expiration date, whichever is first. NOTE: This sheet is a summary. It may not cover all possible information. If you have questions about this medicine, talk to your doctor, pharmacist, or health care provider.  2021 Elsevier/Gold Standard (2019-05-10 12:45:04)

## 2020-11-08 ENCOUNTER — Other Ambulatory Visit: Payer: Self-pay

## 2020-11-11 LAB — URINE CULTURE

## 2020-11-21 ENCOUNTER — Telehealth: Payer: Self-pay | Admitting: *Deleted

## 2020-11-21 ENCOUNTER — Encounter: Payer: Self-pay | Admitting: *Deleted

## 2020-11-21 NOTE — Telephone Encounter (Signed)
The letter was sent on 3/17 to Dr Wynetta Emery the PCP.  I have not received a response. I resent it to the PCP.  I will let you know if I get a response.

## 2020-11-21 NOTE — Telephone Encounter (Signed)
Patty,  Do you have a [employer] hold letter for this patient? I may have missed this info in epic. Thanks, Tam Delisle PV

## 2020-11-21 NOTE — Progress Notes (Signed)
Osvaldo Angst, CRNA  Levonne Spiller, RN Keidra Withers,   This pt is cleared for anesthetic care at [facility].   Thanks,   Osvaldo Angst        Previous Messages   ----- Message -----  From: Levonne Spiller, RN  Sent: 11/21/2020  9:50 AM EDT  To: Osvaldo Angst, CRNA   Please review this patient's chart. [place] for [employer]?

## 2020-11-22 ENCOUNTER — Ambulatory Visit: Payer: Self-pay | Attending: Physician Assistant | Admitting: Physician Assistant

## 2020-11-22 ENCOUNTER — Other Ambulatory Visit: Payer: Self-pay

## 2020-11-22 ENCOUNTER — Encounter: Payer: Self-pay | Admitting: Physician Assistant

## 2020-11-22 DIAGNOSIS — E039 Hypothyroidism, unspecified: Secondary | ICD-10-CM

## 2020-11-22 DIAGNOSIS — R7303 Prediabetes: Secondary | ICD-10-CM

## 2020-11-22 DIAGNOSIS — E785 Hyperlipidemia, unspecified: Secondary | ICD-10-CM

## 2020-11-22 DIAGNOSIS — R350 Frequency of micturition: Secondary | ICD-10-CM

## 2020-11-22 NOTE — Progress Notes (Signed)
Virtual Visit via Telephone Note  I connected with [name] [name] on 11/22/20 at  3:50 PM EDT by telephone and verified that I am speaking with the correct person using two identifiers.  Location: Patient: home Provider: [facility]    I discussed the limitations, risks, security and privacy concerns of performing an evaluation and management service by telephone and the availability of in person appointments. I also discussed with the patient that there may be a patient responsible charge related to this service. The patient expressed understanding and agreed to proceed.   History of Present Illness:  Patient with 2-3 day h/o urinary frequency.  She does not like to drink water and isn't drinking water.  No fever.  Back aches mildly.  Does not need RF currently.  Needs labs soon and wants to go ahead and get those done.    Observations/Objective:NAD.  A&Ox3   Assessment and Plan: 1. Prediabetes I have had a lengthy discussion and provided education about insulin resistance and the intake of too much sugar/refined carbohydrates.  I have advised the patient to work at a goal of eliminating sugary drinks, candy, desserts, sweets, refined sugars, processed foods, and white carbohydrates.  The patient expresses understanding.   - Hemoglobin A1c; Future - Comprehensive metabolic panel; Future  2. Acquired hypothyroidism - TSH; Future - Comprehensive metabolic panel; Future  3. Dyslipidemia - Lipid panel; Future - Comprehensive metabolic panel; Future  4. Urinary frequency Patient unable to do urine today but will have someone bring her tomorrow morning.  Drink 80-100 ounces water daily.  Imperative!!! - POCT URINALYSIS DIP (CLINITEK); Future - Urine Culture; Future    Follow Up Instructions: Keep appt in June with Dr Joya Gaskins   I discussed the assessment and treatment plan with the patient. The patient was provided an opportunity to ask questions and all were answered. The patient agreed  with the plan and demonstrated an understanding of the instructions.   The patient was advised to call back or seek an in-person evaluation if the symptoms worsen or if the condition fails to improve as anticipated.  I provided 13 minutes of non-face-to-face time during this encounter.   Freeman Caldron, PA-C  Patient ID: [name] [name], female   DOB: [birth year], 65 y.o.   MRN: [id number]

## 2020-11-24 ENCOUNTER — Other Ambulatory Visit: Payer: Self-pay

## 2020-11-24 ENCOUNTER — Telehealth: Payer: Self-pay | Admitting: Internal Medicine

## 2020-11-24 ENCOUNTER — Ambulatory Visit: Payer: Self-pay | Attending: Internal Medicine

## 2020-11-24 DIAGNOSIS — E039 Hypothyroidism, unspecified: Secondary | ICD-10-CM

## 2020-11-24 DIAGNOSIS — E785 Hyperlipidemia, unspecified: Secondary | ICD-10-CM

## 2020-11-24 DIAGNOSIS — R350 Frequency of micturition: Secondary | ICD-10-CM

## 2020-11-24 DIAGNOSIS — R7303 Prediabetes: Secondary | ICD-10-CM

## 2020-11-24 LAB — POCT URINALYSIS DIP (CLINITEK)
Bilirubin, UA: NEGATIVE
Glucose, UA: NEGATIVE mg/dL
Ketones, POC UA: NEGATIVE mg/dL
Nitrite, UA: NEGATIVE
POC PROTEIN,UA: 100 — AB
Spec Grav, UA: 1.025 (ref 1.010–1.025)
Urobilinogen, UA: 0.2 E.U./dL
pH, UA: 6.5 (ref 5.0–8.0)

## 2020-11-24 MED ORDER — NITROFURANTOIN MONOHYD MACRO 100 MG PO CAPS
100.0000 mg | ORAL_CAPSULE | Freq: Two times a day (BID) | ORAL | 0 refills | Status: DC
  Filled 2020-11-24: qty 20, 10d supply, fill #0

## 2020-11-24 NOTE — Telephone Encounter (Signed)
Patient came in today for urine sample and labs and when checking out she is requesting something to be sent to her pharmacy because she is still having pain on her kidneys. I advised her I would send the message to nurse and let nurse know she came for labs. Patient requesting something sent today. Please follow up.

## 2020-11-24 NOTE — Telephone Encounter (Signed)
Ran pt poct urine and urine was abnormal. Contacted [facility] and provided urine results. Per Levada Dy she would like for me to send in Paxtang 100mg  Take 1 capsule by mouth 2 times daily. Pt was in the pharmacy line and made aware that rx has been sent. Pt doesn't have any questions or concerns

## 2020-11-25 LAB — COMPREHENSIVE METABOLIC PANEL
ALT: 15 IU/L (ref 0–32)
AST: 15 IU/L (ref 0–40)
Albumin/Globulin Ratio: 2 (ref 1.2–2.2)
Albumin: 4.2 g/dL (ref 3.8–4.8)
Alkaline Phosphatase: 107 IU/L (ref 44–121)
BUN/Creatinine Ratio: 14 (ref 12–28)
BUN: 11 mg/dL (ref 8–27)
Bilirubin Total: 0.3 mg/dL (ref 0.0–1.2)
CO2: 25 mmol/L (ref 20–29)
Calcium: 9.6 mg/dL (ref 8.7–10.3)
Chloride: 102 mmol/L (ref 96–106)
Creatinine, Ser: 0.8 mg/dL (ref 0.57–1.00)
Globulin, Total: 2.1 g/dL (ref 1.5–4.5)
Glucose: 101 mg/dL — ABNORMAL HIGH (ref 65–99)
Potassium: 4.3 mmol/L (ref 3.5–5.2)
Sodium: 141 mmol/L (ref 134–144)
Total Protein: 6.3 g/dL (ref 6.0–8.5)
eGFR: 82 mL/min/{1.73_m2} (ref >59)

## 2020-11-25 LAB — HEMOGLOBIN A1C
Est. average glucose Bld gHb Est-mCnc: 126 mg/dL
Hgb A1c MFr Bld: 6 % — ABNORMAL HIGH (ref 4.8–5.6)

## 2020-11-25 LAB — LIPID PANEL
Chol/HDL Ratio: 2.5 ratio (ref 0.0–4.4)
Cholesterol, Total: 147 mg/dL (ref 100–199)
HDL: 58 mg/dL (ref >39)
LDL Chol Calc (NIH): 69 mg/dL (ref 0–99)
Triglycerides: 109 mg/dL (ref 0–149)
VLDL Cholesterol Cal: 20 mg/dL (ref 5–40)

## 2020-11-25 LAB — TSH: TSH: 6.9 u[IU]/mL — ABNORMAL HIGH (ref 0.450–4.500)

## 2020-11-27 ENCOUNTER — Other Ambulatory Visit: Payer: Self-pay

## 2020-11-27 MED FILL — Metformin HCl Tab 500 MG: ORAL | 30 days supply | Qty: 60 | Fill #0 | Status: CN

## 2020-11-27 MED FILL — Amlodipine Besylate Tab 5 MG (Base Equivalent): ORAL | 30 days supply | Qty: 30 | Fill #1 | Status: AC

## 2020-11-28 ENCOUNTER — Other Ambulatory Visit: Payer: Self-pay

## 2020-11-28 ENCOUNTER — Other Ambulatory Visit: Payer: Self-pay | Admitting: Physician Assistant

## 2020-11-28 ENCOUNTER — Other Ambulatory Visit: Payer: Self-pay | Admitting: Internal Medicine

## 2020-11-28 DIAGNOSIS — E1165 Type 2 diabetes mellitus with hyperglycemia: Secondary | ICD-10-CM

## 2020-11-28 MED ORDER — MONTELUKAST SODIUM 10 MG PO TABS
ORAL_TABLET | Freq: Every day | ORAL | 2 refills | Status: DC
  Filled 2020-11-28: qty 30, 30d supply, fill #0
  Filled 2021-01-01: qty 30, 30d supply, fill #1
  Filled 2021-01-22 – 2021-01-26 (×2): qty 30, 30d supply, fill #2

## 2020-11-28 MED ORDER — METFORMIN HCL 500 MG PO TABS
ORAL_TABLET | Freq: Two times a day (BID) | ORAL | 3 refills | Status: DC
  Filled 2020-11-29: qty 60, 30d supply, fill #0
  Filled 2021-01-22: qty 60, 30d supply, fill #1
  Filled 2021-05-02: qty 60, 30d supply, fill #2
  Filled 2021-07-17: qty 60, 30d supply, fill #3

## 2020-11-28 MED ORDER — LEVOTHYROXINE SODIUM 25 MCG PO TABS
25.0000 ug | ORAL_TABLET | Freq: Every day | ORAL | 3 refills | Status: DC
  Filled 2020-11-28: qty 30, 30d supply, fill #0
  Filled 2021-01-01: qty 30, 30d supply, fill #1

## 2020-11-29 ENCOUNTER — Other Ambulatory Visit: Payer: Self-pay

## 2020-11-29 MED FILL — Albuterol Sulfate Inhal Aero 108 MCG/ACT (90MCG Base Equiv): RESPIRATORY_TRACT | 25 days supply | Qty: 18 | Fill #0 | Status: AC

## 2020-11-29 MED FILL — Atorvastatin Calcium Tab 80 MG (Base Equivalent): ORAL | 30 days supply | Qty: 30 | Fill #1 | Status: AC

## 2020-11-29 MED FILL — Buspirone HCl Tab 15 MG: ORAL | 30 days supply | Qty: 120 | Fill #0 | Status: AC

## 2020-11-29 MED FILL — Gabapentin Tab 600 MG: ORAL | 30 days supply | Qty: 120 | Fill #0 | Status: AC

## 2020-11-29 MED FILL — Doxepin HCl Cap 50 MG: ORAL | 30 days supply | Qty: 30 | Fill #1 | Status: AC

## 2020-11-29 MED FILL — Ticagrelor Tab 60 MG: ORAL | 30 days supply | Qty: 60 | Fill #1 | Status: AC

## 2020-11-29 MED FILL — Ticagrelor Tab 60 MG: ORAL | 30 days supply | Qty: 60 | Fill #1 | Status: CN

## 2020-11-29 MED FILL — Losartan Potassium Tab 50 MG: ORAL | 30 days supply | Qty: 45 | Fill #0 | Status: AC

## 2020-11-29 MED FILL — Escitalopram Oxalate Tab 10 MG (Base Equiv): ORAL | 30 days supply | Qty: 90 | Fill #0 | Status: AC

## 2020-11-29 MED FILL — Mirtazapine Tab 30 MG: ORAL | 30 days supply | Qty: 30 | Fill #1 | Status: AC

## 2020-11-30 ENCOUNTER — Other Ambulatory Visit: Payer: Self-pay

## 2020-12-01 ENCOUNTER — Other Ambulatory Visit: Payer: Self-pay

## 2020-12-01 LAB — URINE CULTURE

## 2020-12-05 ENCOUNTER — Telehealth: Payer: Self-pay | Admitting: Internal Medicine

## 2020-12-05 ENCOUNTER — Ambulatory Visit: Payer: Self-pay

## 2020-12-05 NOTE — Telephone Encounter (Addendum)
Patient called and advised she has refills of Trelegy at the pharmacy, 4 refills were sent on 11/07/20. She says she will call for the refill. See triage encounter for triage relating to the reason for prednisone refill request.

## 2020-12-05 NOTE — Progress Notes (Signed)
Patient ID: [name] [name], female    DOB: 1955/10/19  MRN: [id number]  CC: COPD Flare-Up  Subjective: [name] [name] is a 65 y.o. female who presents for COPD flare-up. Her concerns today include:   1. COPD FLARE-UP: Visit 11/07/2020 per MD note: COPD exacerbation for 4 to 5 days this will give Bactrim twice daily Will switch to Trelegy and get patient assistance  12/06/2020: Reports COPD flare for 2 days. Still using Trelegy. Using Albuterol inhaler and Albuterol nebulizer often. Also, concern for sinuses draining, taking over the counter allergy medications without much relief.   COPD status: exacerbated Satisfied with current treatment?: does not feel that Trelegy is helping much  Dyspnea frequency: yes Cough frequency: yes  Rescue inhaler frequency: often Productive cough: Sometimes not sure what it looks like. Reports sometimes she has to swallow it if in a public place.   2. CAD FOLLOW-UP:  Requesting Atorvastatin refill.   Patient Active Problem List   Diagnosis Date Noted  . Acute cystitis without hematuria 11/07/2020  . Influenza vaccine needed 06/15/2020  . Abdominal pain, epigastric 04/28/2019  . Esophageal dysphagia 04/28/2019  . Bipolar I disorder ([place]) 09/17/2018  . Insomnia 10/28/2017  . Prediabetes 05/28/2017  . QT prolongation 03/25/2017  . Encounter for screening mammogram for breast cancer 09/11/2015  . Psoriasis 06/22/2015  . COPD (chronic obstructive pulmonary disease) (HCC)GOLD C  05/18/2015  . Anxiety and depression 07/10/2013  . Essential hypertension, benign 06/07/2013  . Dyslipidemia 06/07/2013  . Asthma, chronic 06/07/2013  . Emphysema lung ([place]) 06/07/2013  . Chronic back pain 06/07/2013  . CAD S/P percutaneous coronary angioplasty 06/07/2013  . GERD (gastroesophageal reflux disease) 06/07/2013  . Breast lump on left side at 3 o'clock position 10/13/2012  . S/P abdominal hysterectomy and right salpingo-oophorectomy 08/29/2011  . COPD  exacerbation ([place]) 09/15/2009  . TOBACCO ABUSE 07/17/2009  . Chronic rhinitis 07/17/2009  . Lung nodule < 6cm on CT 07/17/2009  . ALLERGY, FOOD 07/17/2009  . Hypothyroidism 12/22/2008     Current Outpatient Medications on File Prior to Visit  Medication Sig Dispense Refill  . albuterol (VENTOLIN HFA) 108 (90 Base) MCG/ACT inhaler INHALE 2 PUFFS INTO THE LUNGS EVERY 6 (SIX) HOURS AS NEEDED FOR WHEEZING OR SHORTNESS OF BREATH. 18 g 2  . amLODipine (NORVASC) 5 MG tablet TAKE 1 TABLET (5 MG TOTAL) BY MOUTH DAILY. 90 tablet 3  . aspirin EC 81 MG tablet Take 81 mg by mouth daily.    . Blood Glucose Monitoring Suppl (TRUE METRIX METER) DEVI 1 kit by Does not apply route 4 (four) times daily. 1 Device 0  . busPIRone (BUSPAR) 15 MG tablet TAKE 2 TABLETS IN THE MORNING, 1 TAB AT NOON AND 1 TAB IN THE EVENING 120 tablet 2  . cetirizine (ZYRTEC) 10 MG tablet Take 10 mg by mouth daily.    . clonazePAM (KLONOPIN) 0.5 MG tablet Take 1 tablet (0.5 mg total) by mouth 2 (two) times daily as needed for anxiety. 60 tablet 2  . cyclobenzaprine (FLEXERIL) 10 MG tablet TAKE 1 TABLET (10 MG TOTAL) BY MOUTH DAILY AS NEEDED. 30 tablet 4  . doxepin (SINEQUAN) 50 MG capsule TAKE 1 CAPSULE (50 MG TOTAL) BY MOUTH AT BEDTIME. 30 capsule 2  . escitalopram (LEXAPRO) 10 MG tablet TAKE 3 TABLETS (30 MG TOTAL) BY MOUTH DAILY. 90 tablet 2  . Fluticasone-Umeclidin-Vilant (TRELEGY ELLIPTA) 100-62.5-25 MCG/INH AEPB Inhale 1 puff into the lungs daily. 60 each 4  . furosemide (LASIX) 20  MG tablet Take 1 tablet (20 mg total) by mouth daily. 30 tablet 6  . gabapentin (NEURONTIN) 600 MG tablet TAKE 1 TABLET BY MOUTH 4 TIMES DAILY 360 tablet 1  . glucose blood (TRUE METRIX BLOOD GLUCOSE TEST) test strip Use as instructed 100 each 12  . hydrocortisone (ANUSOL-HC) 2.5 % rectal cream PLACE 1 APPLICATION RECTALLY 2 (TWO) TIMES DAILY. 30 g 0  . ipratropium-albuterol (DUONEB) 0.5-2.5 (3) MG/3ML SOLN TAKE 3 MLS VIA NEBULIZATION EVERY 6 HOURS  360 mL 1  . levothyroxine (SYNTHROID) 100 MCG tablet TAKE 1 TABLET (100 MCG TOTAL) BY MOUTH DAILY. 30 tablet 6  . levothyroxine (SYNTHROID) 25 MCG tablet Take 1 tablet (25 mcg total) by mouth daily. In addition to 173mg tablet daily 90 tablet 3  . losartan (COZAAR) 50 MG tablet TAKE 1 & 1/2 TABLETS BY MOUTH DAILY. 45 tablet 6  . metFORMIN (GLUCOPHAGE) 500 MG tablet TAKE 1 TABLET (500 MG TOTAL) BY MOUTH 2 (TWO) TIMES DAILY WITH A MEAL. 60 tablet 3  . mirtazapine (REMERON) 30 MG tablet TAKE 1 TABLET (30 MG TOTAL) BY MOUTH AT BEDTIME. 30 tablet 2  . montelukast (SINGULAIR) 10 MG tablet TAKE 1 TABLET BY MOUTH AT BEDTIME. 30 tablet 2  . nebivolol (BYSTOLIC) 10 MG tablet TAKE 1 TABLET (10 MG TOTAL) BY MOUTH DAILY. 90 tablet 4  . nitrofurantoin, macrocrystal-monohydrate, (MACROBID) 100 MG capsule Take 1 capsule (100 mg total) by mouth 2 (two) times daily. 20 capsule 0  . omeprazole (PRILOSEC) 40 MG capsule TAKE 1 CAPSULE (40 MG TOTAL) BY MOUTH 2 (TWO) TIMES DAILY BEFORE A MEAL. 120 capsule 0  . ticagrelor (BRILINTA) 60 MG TABS tablet TAKE 1 TABLET (60 MG TOTAL) BY MOUTH 2 (TWO) TIMES DAILY. 60 tablet 5  . triamcinolone cream (KENALOG) 0.1 % APPLY 1 APPLICATION TOPICALLY 2 TIMES DAILY. 30 g 1  . TRUEPLUS LANCETS 28G MISC 28 g by Does not apply route QID. 120 each 2  . Vitamin E 180 MG CAPS Take 180 mg by mouth daily.    . Cholecalciferol (VITAMIN D) 50 MCG (2000 UT) CAPS Take 2,000 Units by mouth daily. (Patient not taking: Reported on 12/06/2020)    . nitroGLYCERIN (NITROSTAT) 0.4 MG SL tablet Place 1 tablet (0.4 mg total) under the tongue every 5 (five) minutes as needed for chest pain. 90 tablet 3  . promethazine-dextromethorphan (PROMETHAZINE-DM) 6.25-15 MG/5ML syrup Take 5 mLs by mouth at bedtime as needed for cough. (Patient not taking: Reported on 12/06/2020) 100 mL 0   No current facility-administered medications on file prior to visit.    Allergies  Allergen Reactions  . Avocado Anaphylaxis   . Latex Shortness Of Breath and Rash  . Codeine Nausea Only    Social History   Socioeconomic History  . Marital status: Divorced    Spouse name: Not on file  . Number of children: Not on file  . Years of education: Not on file  . Highest education level: Not on file  Occupational History  . Not on file  Tobacco Use  . Smoking status: Current Every Day Smoker    Packs/day: 1.00    Years: 50.00    Pack years: 50.00    Types: Cigarettes  . Smokeless tobacco: Never Used  Vaping Use  . Vaping Use: Never used  Substance and Sexual Activity  . Alcohol use: Yes    Comment: once a week  . Drug use: No    Types: Cocaine    Comment: has  been years per pt  . Sexual activity: Not Currently    Birth control/protection: Surgical  Other Topics Concern  . Not on file  Social History Narrative  . Not on file   Social Determinants of Health   Financial Resource Strain: Not on file  Food Insecurity: Not on file  Transportation Needs: Not on file  Physical Activity: Not on file  Stress: Not on file  Social Connections: Not on file  Intimate Partner Violence: Not on file    Family History  Problem Relation Age of Onset  . Heart disease Mother   . Hypertension Mother   . Stroke Mother   . Mental illness Mother   . Heart disease Father   . Hypertension Father   . Diabetes Father   . Breast cancer Maternal Aunt   . Thyroid disease Paternal Aunt   . Heart disease Maternal Grandfather   . Anesthesia problems Daughter   . Cancer Paternal Grandmother        mouth  . Throat cancer Maternal Uncle   . Hypotension Neg Hx   . Malignant hyperthermia Neg Hx   . Pseudochol deficiency Neg Hx   . Colon cancer Neg Hx   . Stomach cancer Neg Hx   . Colon polyps Neg Hx   . Esophageal cancer Neg Hx   . Pancreatic cancer Neg Hx     Past Surgical History:  Procedure Laterality Date  . ABDOMINAL EXPLORATION SURGERY  1977  . ABDOMINAL HYSTERECTOMY  1977   "left one of my ovaries"  .  BACK SURGERY    . BIOPSY  05/27/2019   Procedure: BIOPSY;  Surgeon: Daneil Dolin, MD;  [facility];  Service: Endoscopy;;  . COLONOSCOPY  2011   Dr. Ardis Hughs: Mild diverticulosis, descending diminutive colon polyp (not retrieved), next colonoscopy 10 years  . CORONARY ANGIOPLASTY WITH STENT PLACEMENT  2011 X2   "regular stents didn't work; had to go back in in ~ 1 month and put in medicated stents"  . DILATION AND CURETTAGE OF UTERUS    . ESOPHAGOGASTRODUODENOSCOPY    . ESOPHAGOGASTRODUODENOSCOPY (EGD) WITH PROPOFOL N/A 05/27/2019   normal esophagus, dilation, erosive gastropathy s/p biopsy, normal duodenum. Negative H.pylori.   Marland Kitchen LEFT HEART CATH AND CORONARY ANGIOGRAPHY N/A 03/26/2017   Procedure: LEFT HEART CATH AND CORONARY ANGIOGRAPHY;  Surgeon: Belva Crome, MD;  Location: [facility];  Service: Cardiovascular;  Laterality: N/A;  . LUMBAR LAMINECTOMY/DECOMPRESSION MICRODISCECTOMY  08/05/2011   Procedure: LUMBAR LAMINECTOMY/DECOMPRESSION MICRODISCECTOMY;  Surgeon: Otilio Connors, MD;  Location: [place] [facility];  Service: Neurosurgery;  Laterality: Right;  Right Lumbar four-five extraforaminal discectomy  . MALONEY DILATION N/A 05/27/2019   Procedure: Venia Minks DILATION;  Surgeon: Daneil Dolin, MD;  [facility];  Service: Endoscopy;  Laterality: N/A;  54  . TONSILLECTOMY     as a child    ROS: Review of Systems Negative except as stated above  PHYSICAL EXAM: BP 128/76 (BP Location: Left Arm, Patient Position: Sitting, Cuff Size: Normal)   Pulse 68   Temp 97.7 F (36.5 C)   Resp 14   Ht 5' 0.98" (1.549 m)   Wt 134 lb (60.8 kg)   SpO2 94%   BMI 25.33 kg/m   Physical Exam HENT:     Head: Normocephalic and atraumatic.  Eyes:     Extraocular Movements: Extraocular movements intact.     Conjunctiva/sclera: Conjunctivae normal.     Pupils: Pupils are equal, round,  and reactive to light.  Cardiovascular:     Rate and Rhythm: Normal rate and  regular rhythm.     Pulses: Normal pulses.     Heart sounds: Normal heart sounds.  Pulmonary:     Breath sounds: Wheezing present.  Musculoskeletal:     Cervical back: Normal range of motion and neck supple.  Neurological:     General: No focal deficit present.     Mental Status: She is alert and oriented to person, place, and time.  Psychiatric:        Mood and Affect: Mood normal.        Behavior: Behavior normal.     ASSESSMENT AND PLAN: 1. COPD exacerbation ([place]): - Continue current regimen.  - Prednisone as prescribed.  - Doxycycline as prescribed.  - Follow-up with Dr. Joya Gaskins or Dr. Wynetta Emery in 5 days.  - Patient was given clear instructions to go to Emergency Department if symptoms worsen or new problems develop prior to appointment in 5 days. The patient verbalized understanding. - predniSONE (DELTASONE) 10 MG tablet; Take 5 tablets (50 mg total) by mouth daily with breakfast.  Dispense: 25 tablet; Refill: 0 - doxycycline (VIBRA-TABS) 100 MG tablet; Take 1 tablet (100 mg total) by mouth 2 (two) times daily for 7 days.  Dispense: 14 tablet; Refill: 0  2. Coronary artery disease involving native coronary artery of native heart with other form of angina pectoris [facility]):  -Practice low-fat heart healthy diet and at least 150 minutes of moderate intensity exercise weekly as tolerated.  - Continue Atorvastatin as prescribed. - Follow-up with primary provider as scheduled.  - atorvastatin (LIPITOR) 80 MG tablet; Take 1 tablet (80 mg total) by mouth daily.  Dispense: 90 tablet; Refill: 0    Patient was given the opportunity to ask questions.  Patient verbalized understanding of the plan and was able to repeat key elements of the plan. Patient was given clear instructions to go to Emergency Department or return to medical center if symptoms don't improve, worsen, or new problems develop.The patient verbalized understanding.   Requested Prescriptions   Signed Prescriptions Disp  Refills  . predniSONE (DELTASONE) 10 MG tablet 25 tablet 0    Sig: Take 5 tablets (50 mg total) by mouth daily with breakfast.  . doxycycline (VIBRA-TABS) 100 MG tablet 14 tablet 0    Sig: Take 1 tablet (100 mg total) by mouth 2 (two) times daily for 7 days.  Marland Kitchen atorvastatin (LIPITOR) 80 MG tablet 90 tablet 0    Sig: Take 1 tablet (80 mg total) by mouth daily.    Return in about 5 days (around 12/11/2020) for Follow-Up Dr. Joya Gaskins.  Camillia Herter, NP

## 2020-12-05 NOTE — Telephone Encounter (Signed)
Medication Refill - Medication: Fluticasone-Umeclidin-Vilant (TRELEGY ELLIPTA) 100-62.5-25 MCG/INH AEPB predniSONE (DELTASONE) 10 MG tablet    Preferred Pharmacy (with phone number or street name): [place]  Agent: Please be advised that RX refills may take up to 3 business days. We ask that you follow-up with your pharmacy.

## 2020-12-05 NOTE — Telephone Encounter (Signed)
Patient called and says she is wanting a refill on her prednisone and trelegy inhaler. I called her to ask about the prednisone. She says she is having worsening SOB and when this happens, Dr. Joya Gaskins or Dr. Wynetta Emery will prescribe prednisone and it helps her COPD flaring up. She says since yesterday she's used more of the inhalers and is using her nebulizer, which is something she rarely does. She says the SOB is worse when moving around and she can hardly make it up her stairs without stopping to catch her breath. She says she has wheezing and tightness in her chest. She says she has sinus congestion and runny nose and that makes her cough when it's draining in her chest. She says she was seen by Dr. Joya Gaskins a few weeks ago and was given trelegy and prednisone. She says the prednisone really didn't help clear it all up. I advised to go to the ED, she refuses. She says if they could just call her in some prednisone she would be fine. No MyChart visits or OV available with any provider in the office. I called and spoke to [facility], [facility] who advised she could schedule the patient at [facility] tomorrow at 1430 or 1530, patient advised and she asked for the 1530 appointment tomorrow. Advised if able to find a ride to the mobile bus today, location given, that she can go there before 6pm, but call the office to cancel tomorrow's appointment. Patient verbalized understanding.  Reason for Disposition . [1] Longstanding difficulty breathing (e.g., CHF, COPD, emphysema) AND [2] WORSE than normal  Answer Assessment - Initial Assessment Questions 1. RESPIRATORY STATUS: "Describe your breathing?" (e.g., wheezing, shortness of breath, unable to speak, severe coughing)      Wheezing, SOB 2. ONSET: "When did this breathing problem begin?"      Yesterday it got worse, used the nebulizer machine and inhaler more than normal 3. PATTERN "Does the difficult breathing come and go, or has it been constant since it started?"       Constant 4. SEVERITY: "How bad is your breathing?" (e.g., mild, moderate, severe)    - MILD: No SOB at rest, mild SOB with walking, speaks normally in sentences, can lie down, no retractions, pulse < 100.    - MODERATE: SOB at rest, SOB with minimal exertion and prefers to sit, cannot lie down flat, speaks in phrases, mild retractions, audible wheezing, pulse 100-120.    - SEVERE: Very SOB at rest, speaks in single words, struggling to breathe, sitting hunched forward, retractions, pulse > 120      Moderate 5. RECURRENT SYMPTOM: "Have you had difficulty breathing before?" If Yes, ask: "When was the last time?" and "What happened that time?"      Yes COPD, few weeks ago got trelegy and prednisone 6. CARDIAC HISTORY: "Do you have any history of heart disease?" (e.g., heart attack, angina, bypass surgery, angioplasty)      Yes, heat attack 7. LUNG HISTORY: "Do you have any history of lung disease?"  (e.g., pulmonary embolus, asthma, emphysema)     COPD, Asthma, Empysema 8. CAUSE: "What do you think is causing the breathing problem?"      COPD flaring up 9. OTHER SYMPTOMS: "Do you have any other symptoms? (e.g., dizziness, runny nose, cough, chest pain, fever)     Chest tightness, runny/congested nose, cough 10. O2 SATURATION MONITOR:  "Do you use an oxygen saturation monitor (pulse oximeter) at home?" If Yes, "What is your reading (oxygen level) today?" "  What is your usual oxygen saturation reading?" (e.g., 95%)       No 11. PREGNANCY: "Is there any chance you are pregnant?" "When was your last menstrual period?"       N/A 12. TRAVEL: "Have you traveled out of the country in the last month?" (e.g., travel history, exposures)       No  Protocols used: BREATHING DIFFICULTY-A-AH

## 2020-12-06 ENCOUNTER — Encounter: Payer: Self-pay | Admitting: Family

## 2020-12-06 ENCOUNTER — Other Ambulatory Visit: Payer: Self-pay

## 2020-12-06 ENCOUNTER — Ambulatory Visit (INDEPENDENT_AMBULATORY_CARE_PROVIDER_SITE_OTHER): Payer: Self-pay | Admitting: Family

## 2020-12-06 VITALS — BP 128/76 | HR 68 | Temp 97.7°F | Resp 14 | Ht 60.98 in | Wt 134.0 lb

## 2020-12-06 DIAGNOSIS — I25118 Atherosclerotic heart disease of native coronary artery with other forms of angina pectoris: Secondary | ICD-10-CM

## 2020-12-06 DIAGNOSIS — J441 Chronic obstructive pulmonary disease with (acute) exacerbation: Secondary | ICD-10-CM

## 2020-12-06 MED ORDER — DOXYCYCLINE HYCLATE 100 MG PO TABS
100.0000 mg | ORAL_TABLET | Freq: Two times a day (BID) | ORAL | 0 refills | Status: AC
  Filled 2020-12-06: qty 14, 7d supply, fill #0

## 2020-12-06 MED ORDER — ATORVASTATIN CALCIUM 80 MG PO TABS
80.0000 mg | ORAL_TABLET | Freq: Every day | ORAL | 0 refills | Status: DC
Start: 2020-12-06 — End: 2021-05-02
  Filled 2020-12-06: qty 90, 90d supply, fill #0
  Filled 2021-01-01: qty 30, 30d supply, fill #0
  Filled 2021-02-12: qty 30, 30d supply, fill #1
  Filled 2021-03-23: qty 30, 30d supply, fill #2

## 2020-12-06 MED ORDER — PREDNISONE 10 MG PO TABS
50.0000 mg | ORAL_TABLET | Freq: Every day | ORAL | 0 refills | Status: DC
  Filled 2020-12-06: qty 25, 5d supply, fill #0

## 2020-12-06 NOTE — Telephone Encounter (Signed)
Pt seen [name] [name] 5/25

## 2020-12-06 NOTE — Progress Notes (Signed)
COPD flare Atorvastatin needs refill

## 2020-12-15 ENCOUNTER — Other Ambulatory Visit: Payer: Self-pay

## 2020-12-15 MED FILL — Levothyroxine Sodium Tab 100 MCG: ORAL | 30 days supply | Qty: 30 | Fill #1 | Status: AC

## 2020-12-16 ENCOUNTER — Other Ambulatory Visit: Payer: Self-pay

## 2020-12-16 ENCOUNTER — Emergency Department (HOSPITAL_COMMUNITY): Payer: Medicaid Other

## 2020-12-16 ENCOUNTER — Encounter (HOSPITAL_COMMUNITY): Payer: Self-pay

## 2020-12-16 ENCOUNTER — Emergency Department (HOSPITAL_COMMUNITY)
Admission: EM | Admit: 2020-12-16 | Discharge: 2020-12-16 | Disposition: A | Discharge status: Home / Self Care | Payer: Medicaid Other | Attending: Emergency Medicine | Admitting: Emergency Medicine

## 2020-12-16 DIAGNOSIS — E039 Hypothyroidism, unspecified: Secondary | ICD-10-CM | POA: Insufficient documentation

## 2020-12-16 DIAGNOSIS — R112 Nausea with vomiting, unspecified: Secondary | ICD-10-CM | POA: Insufficient documentation

## 2020-12-16 DIAGNOSIS — Z7982 Long term (current) use of aspirin: Secondary | ICD-10-CM | POA: Insufficient documentation

## 2020-12-16 DIAGNOSIS — R197 Diarrhea, unspecified: Secondary | ICD-10-CM | POA: Insufficient documentation

## 2020-12-16 DIAGNOSIS — R1011 Right upper quadrant pain: Secondary | ICD-10-CM | POA: Insufficient documentation

## 2020-12-16 DIAGNOSIS — F17210 Nicotine dependence, cigarettes, uncomplicated: Secondary | ICD-10-CM | POA: Insufficient documentation

## 2020-12-16 DIAGNOSIS — E119 Type 2 diabetes mellitus without complications: Secondary | ICD-10-CM | POA: Insufficient documentation

## 2020-12-16 DIAGNOSIS — J449 Chronic obstructive pulmonary disease, unspecified: Secondary | ICD-10-CM | POA: Insufficient documentation

## 2020-12-16 DIAGNOSIS — Z7984 Long term (current) use of oral hypoglycemic drugs: Secondary | ICD-10-CM | POA: Insufficient documentation

## 2020-12-16 DIAGNOSIS — Z91040 Latex allergy status: Secondary | ICD-10-CM | POA: Insufficient documentation

## 2020-12-16 DIAGNOSIS — J45909 Unspecified asthma, uncomplicated: Secondary | ICD-10-CM | POA: Insufficient documentation

## 2020-12-16 DIAGNOSIS — Z79899 Other long term (current) drug therapy: Secondary | ICD-10-CM | POA: Insufficient documentation

## 2020-12-16 DIAGNOSIS — I2510 Atherosclerotic heart disease of native coronary artery without angina pectoris: Secondary | ICD-10-CM | POA: Insufficient documentation

## 2020-12-16 DIAGNOSIS — I10 Essential (primary) hypertension: Secondary | ICD-10-CM | POA: Insufficient documentation

## 2020-12-16 LAB — COMPREHENSIVE METABOLIC PANEL
ALT: 17 U/L (ref 0–44)
AST: 17 U/L (ref 15–41)
Albumin: 3.5 g/dL (ref 3.5–5.0)
Alkaline Phosphatase: 84 U/L (ref 38–126)
Anion gap: 7 (ref 5–15)
BUN: 13 mg/dL (ref 8–23)
CO2: 26 mmol/L (ref 22–32)
Calcium: 9.3 mg/dL (ref 8.9–10.3)
Chloride: 104 mmol/L (ref 98–111)
Creatinine, Ser: 0.94 mg/dL (ref 0.44–1.00)
GFR, Estimated: >60 mL/min (ref >60)
Glucose, Bld: 127 mg/dL — ABNORMAL HIGH (ref 70–99)
Potassium: 4.5 mmol/L (ref 3.5–5.1)
Sodium: 137 mmol/L (ref 135–145)
Total Bilirubin: 0.5 mg/dL (ref 0.3–1.2)
Total Protein: 6 g/dL — ABNORMAL LOW (ref 6.5–8.1)

## 2020-12-16 LAB — URINALYSIS, ROUTINE W REFLEX MICROSCOPIC
Bilirubin Urine: NEGATIVE
Glucose, UA: NEGATIVE mg/dL
Hgb urine dipstick: NEGATIVE
Ketones, ur: NEGATIVE mg/dL
Leukocytes,Ua: NEGATIVE
Nitrite: NEGATIVE
Protein, ur: NEGATIVE mg/dL
Specific Gravity, Urine: 1.005 (ref 1.005–1.030)
pH: 5 (ref 5.0–8.0)

## 2020-12-16 LAB — CBC
HCT: 42.1 % (ref 36.0–46.0)
Hemoglobin: 13.8 g/dL (ref 12.0–15.0)
MCH: 32 pg (ref 26.0–34.0)
MCHC: 32.8 g/dL (ref 30.0–36.0)
MCV: 97.7 fL (ref 80.0–100.0)
Platelets: 417 10*3/uL — ABNORMAL HIGH (ref 150–400)
RBC: 4.31 MIL/uL (ref 3.87–5.11)
RDW: 14.7 % (ref 11.5–15.5)
WBC: 11.2 10*3/uL — ABNORMAL HIGH (ref 4.0–10.5)
nRBC: 0 % (ref 0.0–0.2)

## 2020-12-16 LAB — LIPASE, BLOOD: Lipase: 33 U/L (ref 11–51)

## 2020-12-16 MED ORDER — ONDANSETRON 4 MG PO TBDP: 4.0000 mg | ORAL_TABLET | Freq: Three times a day (TID) | ORAL | 0 refills | Status: DC | PRN

## 2020-12-16 NOTE — ED Triage Notes (Signed)
Patient complains of right flank pain since last night, states pain worse with any movement. Vomiting and diarrhea with same. Also reports recent kidney infection

## 2020-12-16 NOTE — ED Provider Notes (Signed)
[facility] EMERGENCY DEPARTMENT Provider Note   CSN: [id number] Arrival date & time: 12/16/20  1442     History No chief complaint on file.   [name] [name] is a 65 y.o. female.  The history is provided by the patient and medical records.   [name] [name] is a 65 y.o. female who presents to the Emergency Department complaining of abdominal pain. She presents the emergency department complaining of right upper quadrant and right flank pain that started last night. Pain is constant and severe in nature. She has associated nausea, vomiting, diarrhea. Pain is worse with movement. No fevers, dysuria, chest pain, shortness of breath. She thinks the pain is related to her gallbladder. She did recently have a kidney infection and is finishing a course of antibiotics.    Past Medical History:  Diagnosis Date  . Anxiety    takes Lexapro daily  . Arthritis    "back, from neck down pass my bra area" (03/25/2017)  . Asthma   . Bartholin gland cyst 08/29/2011  . Bruises easily    pt is on Effient  . Chronic back pain    herniated nucleus pulposus  . Chronic back pain    "neck to bra area; lower back" (03/25/2017)  . COPD (chronic obstructive pulmonary disease) ([place])    early stages  . Coronary artery disease   . Depression    takes Klonopin daily  . Diabetes mellitus without complication ([place])   . Diverticulosis   . GERD (gastroesophageal reflux disease)    takes Nexium daily  . H/O hiatal hernia   . Heart attack ([place]) 2011  . Hemorrhoids   . Hernia   . Hyperlipidemia    takes Lipitor daily  . Hypertension    takes Losartan daily and Labetalol bid  . Hypothyroidism    takes Synthroid daily  . Insomnia    hydroxyzine prn  . Joint pain   . Pneumonia    "couple times" (03/25/2017)  . Pre-diabetes    "just found out 1 wk ago" (03/25/2017)  . Psoriasis    elbows,knees,back  . Shortness of breath    with exertion  . Slowing of urinary stream   . Stress  incontinence     Patient Active Problem List   Diagnosis Date Noted  . Acute cystitis without hematuria 11/07/2020  . Influenza vaccine needed 06/15/2020  . Abdominal pain, epigastric 04/28/2019  . Esophageal dysphagia 04/28/2019  . Bipolar I disorder ([place]) 09/17/2018  . Insomnia 10/28/2017  . Prediabetes 05/28/2017  . QT prolongation 03/25/2017  . Encounter for screening mammogram for breast cancer 09/11/2015  . Psoriasis 06/22/2015  . COPD (chronic obstructive pulmonary disease) (H[facility])GOLD C  05/18/2015  . Anxiety and depression 07/10/2013  . Essential hypertension, benign 06/07/2013  . Dyslipidemia 06/07/2013  . Asthma, chronic 06/07/2013  . Emphysema lung ([place]) 06/07/2013  . Chronic back pain 06/07/2013  . CAD S/P percutaneous coronary angioplasty 06/07/2013  . GERD (gastroesophageal reflux disease) 06/07/2013  . Breast lump on left side at 3 o'clock position 10/13/2012  . S/P abdominal hysterectomy and right salpingo-oophorectomy 08/29/2011  . COPD exacerbation ([place]) 09/15/2009  . TOBACCO ABUSE 07/17/2009  . Chronic rhinitis 07/17/2009  . Lung nodule < 6cm on CT 07/17/2009  . ALLERGY, FOOD 07/17/2009  . Hypothyroidism 12/22/2008    Past Surgical History:  Procedure Laterality Date  . ABDOMINAL EXPLORATION SURGERY  1977  . ABDOMINAL HYSTERECTOMY  1977   "left one of my ovaries"  .  BACK SURGERY    . BIOPSY  05/27/2019   Procedure: BIOPSY;  Surgeon: Daneil Dolin, MD;  Location: [facility];  Service: Endoscopy;;  . COLONOSCOPY  2011   Dr. Ardis Hughs: Mild diverticulosis, descending diminutive colon polyp (not retrieved), next colonoscopy 10 years  . CORONARY ANGIOPLASTY WITH STENT PLACEMENT  2011 X2   "regular stents didn't work; had to go back in in ~ 1 month and put in medicated stents"  . DILATION AND CURETTAGE OF UTERUS    . ESOPHAGOGASTRODUODENOSCOPY    . ESOPHAGOGASTRODUODENOSCOPY (EGD) WITH PROPOFOL N/A 05/27/2019   normal esophagus, dilation, erosive  gastropathy s/p biopsy, normal duodenum. Negative H.pylori.   Marland Kitchen LEFT HEART CATH AND CORONARY ANGIOGRAPHY N/A 03/26/2017   Procedure: LEFT HEART CATH AND CORONARY ANGIOGRAPHY;  Surgeon: Belva Crome, MD;  Location: [facility];  Service: Cardiovascular;  Laterality: N/A;  . LUMBAR LAMINECTOMY/DECOMPRESSION MICRODISCECTOMY  08/05/2011   Procedure: LUMBAR LAMINECTOMY/DECOMPRESSION MICRODISCECTOMY;  Surgeon: Otilio Connors, MD;  Location: [facility];  Service: Neurosurgery;  Laterality: Right;  Right Lumbar four-five extraforaminal discectomy  . MALONEY DILATION N/A 05/27/2019   Procedure: Venia Minks DILATION;  Surgeon: Daneil Dolin, MD;  Location: [facility];  Service: Endoscopy;  Laterality: N/A;  54  . TONSILLECTOMY     as a child     OB History    Gravida  1   Para  1   Term  1   Preterm  0   AB  0   Living  1     SAB  0   IAB  0   Ectopic  0   Multiple  0   Live Births              Family History  Problem Relation Age of Onset  . Heart disease Mother   . Hypertension Mother   . Stroke Mother   . Mental illness Mother   . Heart disease Father   . Hypertension Father   . Diabetes Father   . Breast cancer Maternal Aunt   . Thyroid disease Paternal Aunt   . Heart disease Maternal Grandfather   . Anesthesia problems Daughter   . Cancer Paternal Grandmother        mouth  . Throat cancer Maternal Uncle   . Hypotension Neg Hx   . Malignant hyperthermia Neg Hx   . Pseudochol deficiency Neg Hx   . Colon cancer Neg Hx   . Stomach cancer Neg Hx   . Colon polyps Neg Hx   . Esophageal cancer Neg Hx   . Pancreatic cancer Neg Hx     Social History   Tobacco Use  . Smoking status: Current Every Day Smoker    Packs/day: 1.00    Years: 50.00    Pack years: 50.00    Types: Cigarettes  . Smokeless tobacco: Never Used  Vaping Use  . Vaping Use: Never used  Substance Use Topics  . Alcohol use: Yes    Comment: once a week  . Drug use: No    Types:  Cocaine    Comment: has been years per pt    Home Medications Prior to Admission medications   Medication Sig Start Date End Date Taking? Authorizing Provider  ondansetron (ZOFRAN ODT) 4 MG disintegrating tablet Take 1 tablet (4 mg total) by mouth every 8 (eight) hours as needed for nausea or vomiting. 12/16/20  Yes Quintella Reichert, MD  albuterol (VENTOLIN HFA) [phone] Base)  MCG/ACT inhaler INHALE 2 PUFFS INTO THE LUNGS EVERY 6 (SIX) HOURS AS NEEDED FOR WHEEZING OR SHORTNESS OF BREATH. 06/27/20   Ladell Pier, MD  amLODipine (NORVASC) 5 MG tablet TAKE 1 TABLET (5 MG TOTAL) BY MOUTH DAILY. 06/15/20 06/15/21  Ladell Pier, MD  aspirin EC 81 MG tablet Take 81 mg by mouth daily.    [provider]  atorvastatin (LIPITOR) 80 MG tablet Take 1 tablet (80 mg total) by mouth daily. 12/06/20 03/06/21  Camillia Herter, NP  Blood Glucose Monitoring Suppl (TRUE METRIX METER) DEVI 1 kit by Does not apply route 4 (four) times daily. 04/04/17   Brayton Caves, PA-C  busPIRone (BUSPAR) 15 MG tablet TAKE 2 TABLETS IN THE MORNING, 1 TAB AT NOON AND 1 TAB IN THE EVENING 09/21/20 09/21/21  Nevada Crane, MD  cetirizine (ZYRTEC) 10 MG tablet Take 10 mg by mouth daily.    [provider]  Cholecalciferol (VITAMIN D) 50 MCG (2000 UT) CAPS Take 2,000 Units by mouth daily. Patient not taking: Reported on 12/06/2020    [provider]  clonazePAM (KLONOPIN) 0.5 MG tablet Take 1 tablet (0.5 mg total) by mouth 2 (two) times daily as needed for anxiety. 09/21/20   Nevada Crane, MD  cyclobenzaprine (FLEXERIL) 10 MG tablet TAKE 1 TABLET (10 MG TOTAL) BY MOUTH DAILY AS NEEDED. 06/15/20 06/15/21  Ladell Pier, MD  doxepin (SINEQUAN) 50 MG capsule TAKE 1 CAPSULE (50 MG TOTAL) BY MOUTH AT BEDTIME. 09/21/20 09/21/21  Nevada Crane, MD  escitalopram (LEXAPRO) 10 MG tablet TAKE 3 TABLETS (30 MG TOTAL) BY MOUTH DAILY. 09/21/20 09/21/21  Nevada Crane, MD  Fluticasone-Umeclidin-Vilant (TRELEGY ELLIPTA)  100-62.5-25 MCG/INH AEPB Inhale 1 puff into the lungs daily. 11/07/20   Elsie Stain, MD  furosemide (LASIX) 20 MG tablet Take 1 tablet (20 mg total) by mouth daily. 07/24/20   Ladell Pier, MD  gabapentin (NEURONTIN) 600 MG tablet TAKE 1 TABLET BY MOUTH 4 TIMES DAILY 09/29/20 09/29/21  Ladell Pier, MD  glucose blood (TRUE METRIX BLOOD GLUCOSE TEST) test strip Use as instructed 01/21/18   Ladell Pier, MD  hydrocortisone (ANUSOL-HC) 2.5 % rectal cream PLACE 1 APPLICATION RECTALLY 2 (TWO) TIMES DAILY. 05/21/20 05/21/21  Isla Pence, MD  ipratropium-albuterol (DUONEB) 0.5-2.5 (3) MG/3ML SOLN TAKE 3 MLS VIA NEBULIZATION EVERY 6 HOURS 11/18/19   Elsie Stain, MD  levothyroxine (SYNTHROID) 100 MCG tablet TAKE 1 TABLET (100 MCG TOTAL) BY MOUTH DAILY. 06/15/20 06/15/21  Ladell Pier, MD  levothyroxine (SYNTHROID) 25 MCG tablet Take 1 tablet (25 mcg total) by mouth daily. In addition to 163mg tablet daily 11/28/20   MArgentina Donovan PA-C  losartan (COZAAR) 50 MG tablet TAKE 1 & 1/2 TABLETS BY MOUTH DAILY. 06/15/20 06/15/21  JLadell Pier MD  metFORMIN (GLUCOPHAGE) 500 MG tablet TAKE 1 TABLET (500 MG TOTAL) BY MOUTH 2 (TWO) TIMES DAILY WITH A MEAL. 11/28/20 11/28/21  MArgentina Donovan PA-C  mirtazapine (REMERON) 30 MG tablet TAKE 1 TABLET (30 MG TOTAL) BY MOUTH AT BEDTIME. 09/21/20 09/21/21  KNevada Crane MD  montelukast (SINGULAIR) 10 MG tablet TAKE 1 TABLET BY MOUTH AT BEDTIME. 11/28/20 11/28/21  WElsie Stain MD  nebivolol (BYSTOLIC) 10 MG tablet TAKE 1 TABLET (10 MG TOTAL) BY MOUTH DAILY. 06/15/20 06/15/21  JLadell Pier MD  nitrofurantoin, macrocrystal-monohydrate, (MACROBID) 100 MG capsule Take 1 capsule (100 mg total) by mouth 2 (two) times daily. 11/24/20   MArgentina Donovan  PA-C  nitroGLYCERIN (NITROSTAT) 0.4 MG SL tablet Place 1 tablet (0.4 mg total) under the tongue every 5 (five) minutes as needed for chest pain. 11/08/19 02/06/20  Kroeger, Lorelee Cover., PA-C   omeprazole (PRILOSEC) 40 MG capsule TAKE 1 CAPSULE (40 MG TOTAL) BY MOUTH 2 (TWO) TIMES DAILY BEFORE A MEAL. 10/25/20 10/25/21  Ladell Pier, MD  predniSONE (DELTASONE) 10 MG tablet Take 5 tablets (50 mg total) by mouth daily with breakfast. 12/06/20   Camillia Herter, NP  promethazine-dextromethorphan (PROMETHAZINE-DM) 6.25-15 MG/5ML syrup Take 5 mLs by mouth at bedtime as needed for cough. Patient not taking: Reported on 12/06/2020 10/23/20   Jaynee Eagles, PA-C  ticagrelor (BRILINTA) 60 MG TABS tablet TAKE 1 TABLET (60 MG TOTAL) BY MOUTH 2 (TWO) TIMES DAILY. 06/15/20 06/15/21  Ladell Pier, MD  triamcinolone cream (KENALOG) 0.1 % APPLY 1 APPLICATION TOPICALLY 2 TIMES DAILY. 06/19/18   Ladell Pier, MD  TRUEPLUS LANCETS 28G MISC 28 g by Does not apply route QID. 04/04/17   Brayton Caves, PA-C  Vitamin E 180 MG CAPS Take 180 mg by mouth daily.    [provider]    Allergies    Avocado, Latex, and Codeine  Review of Systems   Review of Systems  All other systems reviewed and are negative.   Physical Exam Updated Vital Signs BP (!) 149/54 (BP Location: Right Arm)   Pulse 69   Temp 98.6 F (37 C) (Oral)   Resp 18   SpO2 97%   Physical Exam Vitals and nursing note reviewed.  Constitutional:      Appearance: She is well-developed.  HENT:     Head: Normocephalic and atraumatic.  Cardiovascular:     Rate and Rhythm: Normal rate and regular rhythm.  Pulmonary:     Effort: Pulmonary effort is normal. No respiratory distress.  Abdominal:     Palpations: Abdomen is soft.     Tenderness: There is no guarding or rebound.     Comments: Moderate right upper quadrant and right CVA tenderness  Musculoskeletal:        General: No swelling or tenderness.  Skin:    General: Skin is warm and dry.  Neurological:     Mental Status: She is alert and oriented to person, place, and time.  Psychiatric:        Behavior: Behavior normal.     ED Results / Procedures /  Treatments   Labs (all labs ordered are listed, but only abnormal results are displayed) Labs Reviewed  COMPREHENSIVE METABOLIC PANEL - Abnormal; Notable for the following components:      Result Value   Glucose, Bld 127 (*)    Total Protein 6.0 (*)    All other components within normal limits  CBC - Abnormal; Notable for the following components:   WBC 11.2 (*)    Platelets 417 (*)    All other components within normal limits  URINALYSIS, ROUTINE W REFLEX MICROSCOPIC - Abnormal; Notable for the following components:   Color, Urine STRAW (*)    All other components within normal limits  LIPASE, BLOOD    EKG None  Radiology CT Abdomen Pelvis Wo Contrast  Result Date: 12/16/2020 CLINICAL DATA:  Right lower quadrant abdominal pain EXAM: CT ABDOMEN AND PELVIS WITHOUT CONTRAST TECHNIQUE: Multidetector CT imaging of the abdomen and pelvis was performed following the standard protocol without IV contrast. COMPARISON:  09/14/2020 FINDINGS: Lower chest: Mild bronchial wall thickening within the visualized lung bases bilaterally  is in keeping with chronic airway inflammation. The visualized lung bases are clear. At least moderate right coronary artery calcification. Global cardiac size within normal limits. Hepatobiliary: No focal liver abnormality is seen. No gallstones, gallbladder wall thickening, or biliary dilatation. Pancreas: Unremarkable Spleen: Unremarkable Adrenals/Urinary Tract: Stable nodular thickening of the left adrenal gland. Right adrenal gland is unremarkable. Are. Kidneys are normal, without renal calculi, focal lesion, or hydronephrosis. Bladder is unremarkable. Stomach/Bowel: Moderate sigmoid diverticulosis. The stomach, small bowel, and large bowel are otherwise unremarkable. No evidence of obstruction or focal inflammation. Appendix is well visualized and is normal. No free intraperitoneal gas or fluid. Vascular/Lymphatic: Moderate aortoiliac atherosclerotic calcification. No  aortic aneurysm. No pathologic adenopathy within the abdomen and pelvis. Reproductive: Status post hysterectomy. No adnexal masses. Other: No abdominal wall hernia.  The rectum is unremarkable. Musculoskeletal: No lytic or blastic bone lesions are identified. No acute bone abnormality. Osseous structures are age-appropriate IMPRESSION: No acute intra-abdominal pathology identified. No definite radiographic explanation for the patient's reported symptoms. Chronic airway inflammation, not fully characterized on this examination. Moderate right coronary artery calcification. Moderate sigmoid diverticulosis. Aortic Atherosclerosis (ICD10-I70.0). Electronically Signed   By: Fidela Salisbury MD   On: 12/16/2020 22:26   US Abdomen Complete  Result Date: 12/16/2020 CLINICAL DATA:  Right flank pain. EXAM: ABDOMEN ULTRASOUND COMPLETE COMPARISON:  September 08, 2019 FINDINGS: Gallbladder: No gallstones or wall thickening visualized (1.5 mm). No sonographic Murphy sign noted by sonographer. Common bile duct: Diameter: 3.9 mm Liver: No focal lesion identified. Within normal limits in parenchymal echogenicity. Portal vein is patent on color Doppler imaging with normal direction of blood flow towards the liver. IVC: No abnormality visualized. Pancreas: Visualized portion unremarkable. Spleen: Size (5.7 cm) and appearance within normal limits. Right Kidney: Length: 10.1 cm. Echogenicity within normal limits. No mass or hydronephrosis visualized. Left Kidney: Length: 10.3 cm. Echogenicity within normal limits (limited in visualization secondary to overlying bowel gas). No mass or hydronephrosis visualized. Abdominal aorta: No aneurysm visualized (2.4 cm in AP diameter). Other findings: Limited study secondary to overlying bowel gas. IMPRESSION: Limited study with otherwise normal ultrasonographic evaluation of the abdomen. Electronically Signed   By: Virgina Norfolk M.D.   On: 12/16/2020 21:15    Procedures Procedures    Medications Ordered in ED Medications - No data to display  ED Course  I have reviewed the triage vital signs and the nursing notes.  Pertinent labs & imaging results that were available during my care of the patient were reviewed by me and considered in my medical decision making (see chart for details).    MDM Rules/Calculators/A&P                         Patient here for evaluation of right upper quadrant and right flank pain with nausea, vomiting, diarrhea. She had tenderness on examination without peritoneal findings. Ultrasound is negative for cholelithiasis/cholecystitis. UA not consistent with UTI. CT abdomen pelvis was obtained to rule out appendicitis. CT negative for acute appendicitis, demonstrates chronic changes. Discussed with patient findings of studies. Feels she is stable for discharge home with PCP follow-up and return precautions.  Final Clinical Impression(s) / ED Diagnoses Final diagnoses:  RUQ pain    Rx / DC Orders ED Discharge Orders         Ordered    ondansetron (ZOFRAN ODT) 4 MG disintegrating tablet  Every 8 hours PRN        12/16/20 2347  Quintella Reichert, MD 12/16/20 2358

## 2020-12-20 ENCOUNTER — Other Ambulatory Visit: Payer: Self-pay

## 2020-12-20 ENCOUNTER — Telehealth (HOSPITAL_COMMUNITY): Payer: No Payment, Other | Admitting: Psychiatry

## 2020-12-22 ENCOUNTER — Encounter: Payer: Medicaid Other | Admitting: Gastroenterology

## 2020-12-25 ENCOUNTER — Encounter: Payer: Self-pay | Admitting: Internal Medicine

## 2020-12-25 NOTE — Telephone Encounter (Signed)
Patty,  Do we have Brilinta hold on this pt yet?  Thanks, Lelan Pons

## 2020-12-25 NOTE — Telephone Encounter (Signed)
Jackelyn Knife, RMA can you please help with this patient.  We have sent a letter a couple times to get clearance for the pt to hold Brilinta prior to her upcoming endoscopy and colonoscopy.

## 2020-12-26 ENCOUNTER — Telehealth: Payer: Self-pay

## 2020-12-26 NOTE — Telephone Encounter (Signed)
See below the letter was sent on 3/17 to Dr Wynetta Emery. It may have went to her box.  I will attach the letter again as well in case you can't see it. Thank you so much for your help.      Ladell Pier, MD   09/28/2020 9:35 AM Timothy Lasso, RN Letter on 09/28/2020 from Timothy Lasso, RN      Cover Page Message : Please respond ASAP

## 2020-12-26 NOTE — Telephone Encounter (Signed)
We have no received anything in regards to pt holding Brilinta. I will CC pcp on this as well and she can let you know if pt can hold.

## 2020-12-26 NOTE — Telephone Encounter (Signed)
Call placed to patient and obtained information and consent needed for [facility] application.  She explained that it is needed by the end of this month to re-certify her for services.   Completed application then faxed to [facility] [facility].

## 2020-12-26 NOTE — Telephone Encounter (Signed)
09/28/2020   RE: [name] [name] DOB: 05/24/1956 MRN: [id number]   Dear Dr Wynetta Emery,    We have scheduled the above patient for an endoscopic procedure. Our records show that she is on anticoagulation therapy.   Please advise as to how long the patient may come off her therapy of Brilinta prior to the procedure.  Please fax or route back to [phone] .   Sincerely,    [facility]

## 2020-12-26 NOTE — Telephone Encounter (Signed)
See note from Dr Wynetta Emery

## 2021-01-01 ENCOUNTER — Other Ambulatory Visit: Payer: Self-pay

## 2021-01-01 ENCOUNTER — Other Ambulatory Visit (HOSPITAL_COMMUNITY): Payer: Self-pay | Admitting: Psychiatry

## 2021-01-01 MED FILL — Amlodipine Besylate Tab 5 MG (Base Equivalent): ORAL | 30 days supply | Qty: 30 | Fill #2 | Status: AC

## 2021-01-01 MED FILL — Cyclobenzaprine HCl Tab 10 MG: ORAL | 30 days supply | Qty: 30 | Fill #0 | Status: AC

## 2021-01-01 MED FILL — Losartan Potassium Tab 50 MG: ORAL | 30 days supply | Qty: 45 | Fill #1 | Status: AC

## 2021-01-01 MED FILL — Gabapentin Tab 600 MG: ORAL | 30 days supply | Qty: 120 | Fill #1 | Status: AC

## 2021-01-01 MED FILL — Escitalopram Oxalate Tab 10 MG (Base Equiv): ORAL | 30 days supply | Qty: 90 | Fill #1 | Status: AC

## 2021-01-02 ENCOUNTER — Other Ambulatory Visit: Payer: Self-pay

## 2021-01-02 ENCOUNTER — Ambulatory Visit: Payer: Medicaid Other | Admitting: Critical Care Medicine

## 2021-01-02 MED ORDER — MIRTAZAPINE 30 MG PO TABS
ORAL_TABLET | ORAL | 2 refills | Status: DC
  Filled 2021-01-02: qty 30, 30d supply, fill #0
  Filled 2021-02-12: qty 30, 30d supply, fill #1
  Filled 2021-03-23: qty 30, 30d supply, fill #2

## 2021-01-05 ENCOUNTER — Ambulatory Visit: Payer: Medicaid Other

## 2021-01-08 ENCOUNTER — Telehealth (INDEPENDENT_AMBULATORY_CARE_PROVIDER_SITE_OTHER): Payer: No Payment, Other | Admitting: Psychiatry

## 2021-01-08 ENCOUNTER — Encounter (HOSPITAL_COMMUNITY): Payer: Self-pay | Admitting: Psychiatry

## 2021-01-08 ENCOUNTER — Other Ambulatory Visit: Payer: Self-pay

## 2021-01-08 DIAGNOSIS — F3342 Major depressive disorder, recurrent, in full remission: Secondary | ICD-10-CM | POA: Diagnosis not present

## 2021-01-08 DIAGNOSIS — F411 Generalized anxiety disorder: Secondary | ICD-10-CM | POA: Diagnosis not present

## 2021-01-08 MED ORDER — BUSPIRONE HCL 15 MG PO TABS
ORAL_TABLET | ORAL | 2 refills | Status: DC
  Filled 2021-01-08: qty 120, 30d supply, fill #0
  Filled 2021-03-08: qty 120, 30d supply, fill #1
  Filled 2021-05-17: qty 120, 30d supply, fill #2

## 2021-01-08 MED ORDER — DOXEPIN HCL 50 MG PO CAPS
ORAL_CAPSULE | ORAL | 2 refills | Status: DC
  Filled 2021-01-08: qty 30, 30d supply, fill #0
  Filled 2021-02-12: qty 30, 30d supply, fill #1
  Filled 2021-03-23: qty 30, 30d supply, fill #2

## 2021-01-08 MED ORDER — CLONAZEPAM 0.5 MG PO TABS: 0.5000 mg | ORAL_TABLET | Freq: Two times a day (BID) | ORAL | 2 refills | Status: DC | PRN

## 2021-01-08 MED ORDER — ESCITALOPRAM OXALATE 10 MG PO TABS
ORAL_TABLET | ORAL | 2 refills | Status: DC
  Filled 2021-01-08: qty 90, fill #0
  Filled 2021-03-08: qty 90, 30d supply, fill #0

## 2021-01-08 NOTE — Telephone Encounter (Signed)
Okay to hold Brilinta for at least 5 days prior to procedure

## 2021-01-08 NOTE — Progress Notes (Signed)
[place] MD/PA/NP OP Progress Note  Virtual Visit via Telephone Note  I connected with [name] [name] on 01/08/21 at  2:00 PM EDT by telephone and verified that I am speaking with the correct person using two identifiers.  Location: Patient: home Provider: Clinic   I discussed the limitations, risks, security and privacy concerns of performing an evaluation and management service by telephone and the availability of in person appointments. I also discussed with the patient that there may be a patient responsible charge related to this service. The patient expressed understanding and agreed to proceed.   I provided 14 minutes of non-face-to-face time during this encounter.      01/08/2021 1:58 PM [name] [name]  MRN:  [id number]  Chief Complaint: "  I am doing really well."  HPI: Patient reported she is doing really well.  She stated that she just stays at home and takes care of her house.  She stated that her anxiety and her mood has been stable.  She is happy with her current regimen of medications.  She is able to sleep well at night with the help of doxepin and mirtazapine. She denies any significant issues or concerns today.  Past Psychiatric History: history of substance use, crack cocaine addiction, alcohol use disorder  Past Medical History:  Past Medical History:  Diagnosis Date   Anxiety    takes Lexapro daily   Arthritis    "back, from neck down pass my bra area" (03/25/2017)   Asthma    B[place]olin gland cyst 08/29/2011   Bruises easily    pt is on Effient   Chronic back pain    herniated nucleus pulposus   Chronic back pain    "neck to bra area; lower back" (03/25/2017)   COPD (chronic obstructive pulmonary disease) (HCC)    early stages   Coronary artery disease    Depression    takes Klonopin daily   Diabetes mellitus without complication (HCC)    Diverticulosis    GERD (gastroesophageal reflux disease)    takes Nexium daily   H/O hiatal hernia    Heart attack  ([place]) 2011   Hemorrhoids    Hernia    Hyperlipidemia    takes Lipitor daily   Hypertension    takes Losartan daily and Labetalol bid   Hypothyroidism    takes Synthroid daily   Insomnia    hydroxyzine prn   Joint pain    Pneumonia    "couple times" (03/25/2017)   Pre-diabetes    "just found out 1 wk ago" (03/25/2017)   Psoriasis    elbows,knees,back   Shortness of breath    with exertion   Slowing of urinary stream    Stress incontinence     Past Surgical History:  Procedure Laterality Date   [place]   "left one of my ovaries"   BACK SURGERY     BIOPSY  05/27/2019   Procedure: BIOPSY;  Surgeon: Daneil Dolin, MD;  [facility];  Service: Endoscopy;;   COLONOSCOPY  2011   Dr. Ardis Hughs: Mild diverticulosis, descending diminutive colon polyp (not retrieved), next colonoscopy 10 years   CORONARY ANGIOPLASTY WITH STENT PLACEMENT  2011 X2   "regular stents didn't work; had to go back in in ~ 1 month and put in medicated stents"   DILATION AND CURETTAGE OF UTERUS     ESOPHAGOGASTRODUODENOSCOPY     ESOPHAGOGASTRODUODENOSCOPY (EGD) WITH PROPOFOL N/A  05/27/2019   normal esophagus, dilation, erosive gastropathy s/p biopsy, normal duodenum. Negative H.pylori.    LEFT HEART CATH AND CORONARY ANGIOGRAPHY N/A 03/26/2017   Procedure: LEFT HEART CATH AND CORONARY ANGIOGRAPHY;  Surgeon: Belva Crome, MD;  Location: [facility];  Service: Cardiovascular;  Laterality: N/A;   LUMBAR LAMINECTOMY/DECOMPRESSION MICRODISCECTOMY  08/05/2011   Procedure: LUMBAR LAMINECTOMY/DECOMPRESSION MICRODISCECTOMY;  Surgeon: Otilio Connors, MD;  Location: [facility];  Service: Neurosurgery;  Laterality: Right;  Right Lumbar four-five extraforaminal discectomy   MALONEY DILATION N/A 05/27/2019   Procedure: Venia Minks DILATION;  Surgeon: Daneil Dolin, MD;  [facility]: [facility];  Service: Endoscopy;  Laterality: N/A;  56    TONSILLECTOMY     as a child    Family Psychiatric History: See intake H&P for full details. Reviewed, with no updates at this time.   Family History:  Family History  Problem Relation Age of Onset   Heart disease Mother    Hypertension Mother    Stroke Mother    Mental illness Mother    Heart disease Father    Hypertension Father    Diabetes Father    Breast cancer Maternal Aunt    Thyroid disease Paternal Aunt    Heart disease Maternal Grandfather    Anesthesia problems Daughter    Cancer Paternal Grandmother        [place]   Throat cancer Maternal Uncle    Hypotension Neg Hx    Malignant hyperthermia Neg Hx    Pseudochol deficiency Neg Hx    Colon cancer Neg Hx    Stomach cancer Neg Hx    Colon polyps Neg Hx    Esophageal cancer Neg Hx    Pancreatic cancer Neg Hx     Social History:  Social History   Socioeconomic History   Marital status: Divorced    Spouse name: Not on file   Number of children: Not on file   Years of education: Not on file   Highest education level: Not on file  Occupational History   Not on file  Tobacco Use   Smoking status: Every Day    Packs/day: 1.00    Years: 50.00    Pack years: 50.00    Types: Cigarettes   Smokeless tobacco: Never  Vaping Use   Vaping Use: Never used  Substance and Sexual Activity   Alcohol use: Yes    Comment: once a week   Drug use: No    Types: Cocaine    Comment: has been years per pt   Sexual activity: Not Currently    Birth control/protection: Surgical  Other Topics Concern   Not on file  Social History Narrative   Not on file   Social Determinants of Health   Financial Resource Strain: Not on file  Food Insecurity: Not on file  Transportation Needs: Not on file  Physical Activity: Not on file  Stress: Not on file  Social Connections: Not on file    Allergies:  Allergies  Allergen Reactions   Avocado Anaphylaxis   Latex Shortness Of Breath and Rash   Codeine Nausea Only     Metabolic Disorder Labs: Lab Results  Component Value Date   HGBA1C 6.0 (H) 11/24/2020   No results found for: PROLACTIN Lab Results  Component Value Date   CHOL 147 11/24/2020   TRIG 109 11/24/2020   HDL 58 11/24/2020   CHOLHDL 2.5 11/24/2020   VLDL 15 04/16/2016   LDLCALC 69 11/24/2020  [place] 118 (H) 06/15/2020   Lab Results  Component Value Date   TSH 6.900 (H) 11/24/2020   TSH 5.880 (H) 06/15/2020    Therapeutic Level Labs: No results found for: LITHIUM No results found for: VALPROATE No components found for:  CBMZ  Current Medications: Current Outpatient Medications  Medication Sig Dispense Refill   albuterol (VENTOLIN HFA) 108 (90 Base) MCG/ACT inhaler INHALE 2 PUFFS INTO THE LUNGS EVERY 6 (SIX) HOURS AS NEEDED FOR WHEEZING OR SHORTNESS OF BREATH. 18 g 2   amLODipine (NORVASC) 5 MG tablet TAKE 1 TABLET (5 MG TOTAL) BY MOUTH DAILY. 90 tablet 3   aspirin EC 81 MG tablet Take 81 mg by mouth daily.     atorvastatin (LIPITOR) 80 MG tablet Take 1 tablet (80 mg total) by mouth daily. 90 tablet 0   Blood Glucose Monitoring Suppl (TRUE METRIX METER) DEVI 1 kit by Does not apply route 4 (four) times daily. 1 Device 0   busPIRone (BUSPAR) 15 MG tablet TAKE 2 TABLETS IN THE MORNING, 1 TAB AT NOON AND 1 TAB IN THE EVENING 120 tablet 2   cetirizine (ZYRTEC) 10 MG tablet Take 10 mg by mouth daily.     Cholecalciferol (VITAMIN D) 50 MCG (2000 UT) CAPS Take 2,000 Units by mouth daily. (Patient not taking: Reported on 12/06/2020)     clonazePAM (KLONOPIN) 0.5 MG tablet Take 1 tablet (0.5 mg total) by mouth 2 (two) times daily as needed for anxiety. 60 tablet 2   cyclobenzaprine (FLEXERIL) 10 MG tablet TAKE 1 TABLET (10 MG TOTAL) BY MOUTH DAILY AS NEEDED. 30 tablet 4   doxepin (SINEQUAN) 50 MG capsule TAKE 1 CAPSULE (50 MG TOTAL) BY MOUTH AT BEDTIME. 30 capsule 2   escitalopram (LEXAPRO) 10 MG tablet TAKE 3 TABLETS (30 MG TOTAL) BY MOUTH DAILY. 90 tablet 2    Fluticasone-Umeclidin-Vilant (TRELEGY ELLIPTA) 100-62.5-25 MCG/INH AEPB Inhale 1 puff into the lungs daily. 60 each 4   furosemide (LASIX) 20 MG tablet Take 1 tablet (20 mg total) by mouth daily. 30 tablet 6   gabapentin (NEURONTIN) 600 MG tablet TAKE 1 TABLET BY MOUTH 4 TIMES DAILY 360 tablet 1   glucose blood (TRUE METRIX BLOOD GLUCOSE TEST) test strip Use as instructed 100 each 12   hydrocortisone (ANUSOL-HC) 2.5 % rectal cream PLACE 1 APPLICATION RECTALLY 2 (TWO) TIMES DAILY. 30 g 0   ipratropium-albuterol (DUONEB) 0.5-2.5 (3) MG/3ML SOLN TAKE 3 MLS VIA NEBULIZATION EVERY 6 HOURS 360 mL 1   levothyroxine (SYNTHROID) 100 MCG tablet TAKE 1 TABLET (100 MCG TOTAL) BY MOUTH DAILY. 30 tablet 6   levothyroxine (SYNTHROID) 25 MCG tablet Take 1 tablet (25 mcg total) by mouth daily. In addition to 119mg tablet daily 90 tablet 3   losartan (COZAAR) 50 MG tablet TAKE 1 & 1/2 TABLETS BY MOUTH DAILY. 45 tablet 6   metFORMIN (GLUCOPHAGE) 500 MG tablet TAKE 1 TABLET (500 MG TOTAL) BY MOUTH 2 (TWO) TIMES DAILY WITH A MEAL. 60 tablet 3   mirtazapine (REMERON) 30 MG tablet TAKE 1 TABLET (30 MG TOTAL) BY MOUTH AT BEDTIME. 30 tablet 2   montelukast (SINGULAIR) 10 MG tablet TAKE 1 TABLET BY MOUTH AT BEDTIME. 30 tablet 2   nebivolol (BYSTOLIC) 10 MG tablet TAKE 1 TABLET (10 MG TOTAL) BY MOUTH DAILY. 90 tablet 4   nitrofurantoin, macrocrystal-monohydrate, (MACROBID) 100 MG capsule Take 1 capsule (100 mg total) by mouth 2 (two) times daily. 20 capsule 0   nitroGLYCERIN (NITROSTAT) 0.4 MG SL  tablet Place 1 tablet (0.4 mg total) under the tongue every 5 (five) minutes as needed for chest pain. 90 tablet 3   omeprazole (PRILOSEC) 40 MG capsule TAKE 1 CAPSULE (40 MG TOTAL) BY MOUTH 2 (TWO) TIMES DAILY BEFORE A MEAL. 120 capsule 0   ondansetron (ZOFRAN ODT) 4 MG disintegrating tablet Take 1 tablet (4 mg total) by mouth every 8 (eight) hours as needed for nausea or vomiting. 8 tablet 0   predniSONE (DELTASONE) 10 MG tablet  Take 5 tablets (50 mg total) by mouth daily with breakfast. 25 tablet 0   promethazine-dextromethorphan (PROMETHAZINE-DM) 6.25-15 MG/5ML syrup Take 5 mLs by mouth at bedtime as needed for cough. (Patient not taking: Reported on 12/06/2020) 100 mL 0   ticagrelor (BRILINTA) 60 MG TABS tablet TAKE 1 TABLET (60 MG TOTAL) BY MOUTH 2 (TWO) TIMES DAILY. 60 tablet 5   triamcinolone cream (KENALOG) 0.1 % APPLY 1 APPLICATION TOPICALLY 2 TIMES DAILY. 30 g 1   TRUEPLUS LANCETS 28G MISC 28 g by Does not apply route QID. 120 each 2   Vitamin E 180 MG CAPS Take 180 mg by mouth daily.     No current facility-administered medications for this visit.    Musculoskeletal: Strength & Muscle Tone: within normal limits Gait & Station: normal Patient leans: N/A  Psychiatric Specialty Exam: ROS  There were no vitals taken for this visit.There is no height or weight on file to calculate BMI.  General Appearance:  unable to assess due to phone visit  Eye Contact:   unable to assess due to phone visit  Speech:  Clear and Coherent and Normal Rate  Volume:  Normal  Mood:  Euthymic  Affect:  Congruent  Thought Process:  Goal Directed and Descriptions of Associations: Intact  Orientation:  Full (Time, Place, and Person)  Thought Content: Logical   Suicidal Thoughts:  No  Homicidal Thoughts:  No  Memory:  Immediate;   Good  Judgement:  Fair  Insight:  Fair  Psychomotor Activity:  Normal  Concentration:  Concentration: Fair  Recall:  [employer] of Knowledge: Fair  Language: Fair  Akathisia:  Negative  Handed:  Right  AIMS (if indicated): not done  Assets:  Communication Skills Desire for Improvement Housing  ADL's:  Intact  Cognition: WNL  Sleep:  Good   Screenings: AUDIT    Flowsheet Row Admission (Discharged) from 07/10/2013 in [facility]  Alcohol Use Disorder Identification Test Final Score (AUDIT) 12      GAD-7    Flowsheet Row Office Visit from 11/07/2020  in [facility] Office Visit from 06/15/2020 in [facility] from 01/20/2020 in [facility] Office Visit from 11/18/2019 in [facility] Visit from 09/06/2019 in [facility]  Total GAD-7 Score 4 14 0 7 0      PHQ2-9    [place] Office Visit from [facility]/2020 in [facility] Visit from 11/07/2020 in [facility] [place] [facility] Office Visit from 06/15/2020 in [facility] from 01/20/2020 in [facility] [place] Office Visit from 11/18/2019 in [place]  PHQ-2 Total Score 0 1 3 0 2  PHQ-9 Total Score 0 7 9 -- 6      [place] ED from 12/16/2020 in [facility] from 10/23/2020 in  [facility] No Risk No Risk        Assessment and Plan: Pt is stable on her current regimen.  1. Major depressive disorder, recurrent, in full remission with anxious distress (HCC)  - doxepin (SINEQUAN) 50 MG capsule; TAKE 1 CAPSULE (50 MG TOTAL) BY MOUTH AT BEDTIME.  Dispense: 30 capsule; Refill: 2 - busPIRone (BUSPAR) 15 MG tablet; TAKE 2 TABLETS IN THE MORNING, 1 TAB AT NOON AND 1 TAB IN THE EVENING  Dispense: 120 tablet; Refill: 2 - escitalopram (LEXAPRO) 10 MG tablet; TAKE 3 TABLETS (30 MG TOTAL) BY MOUTH DAILY.  Dispense: 90 tablet; Refill: 2  2. GAD (generalized anxiety disorder)  - busPIRone (BUSPAR) 15 MG tablet; TAKE 2 TABLETS IN THE MORNING, 1 TAB AT NOON AND 1 TAB IN THE EVENING  Dispense: 120 tablet; Refill: 2 - escitalopram (LEXAPRO) 10 MG tablet; TAKE 3 TABLETS (30 MG TOTAL) BY MOUTH DAILY.  Dispense: 90 tablet; Refill: 2 - clonazePAM (KLONOPIN) 0.5 MG tablet; Take 1 tablet (0.5 mg total) by mouth 2 (two) times daily as needed for anxiety.  Dispense: 60 tablet; Refill:  2  Continue same regimen. F/up in 3 months. Continue individual therapy and palliative/hospice grief counselor.  Pt was informed that writer is leaving the office and therefore her care is being transferred to a different provider in the clinic.   [place] Crane, MD 01/08/2021, 1:58 PM

## 2021-01-18 ENCOUNTER — Telehealth: Payer: Self-pay | Admitting: Internal Medicine

## 2021-01-18 NOTE — Telephone Encounter (Signed)
Called patient no answer and unable to leave message voicemail is full.

## 2021-01-18 NOTE — Telephone Encounter (Signed)
Copied from [place] [phone] [phone]. Topic: General - Inquiry >> Jan 16, 2021  9:46 AM Valere Dross wrote: Reason for CRM: Pt called in stating last month a form was supposed to be completed and faxed to [facility] for her transportation. Pt states they have not received it and asked if they could resend it to fax:[phone] attn Loma Sousa

## 2021-01-18 NOTE — Telephone Encounter (Signed)
Call returned to patient to inform her that the [employer] application was faxed 8/41/2820 after this CM spoke with her and transmission was confirmed.  Application was re-faxed to [employer] today. Patient's voicemail full, unable to leave a message.

## 2021-01-18 NOTE — Telephone Encounter (Signed)
Please contact pt and schedule a 810 or 130 virtual appt

## 2021-01-18 NOTE — Telephone Encounter (Signed)
Call received from patient and informed her that the [employer] [place] application was faxed [phone] and then again today.  She then explained that she was treated for a UTI about a month ago and she doesn't think it ever cleared up.  She reports ongoing back pain, malodorous cloudy urine and wants to know if Dr Wynetta Emery can proscribe medication for it. Scheduled her for virtual visit with Dr Wynetta Emery  - 01/19/2021 @ [phone]

## 2021-01-18 NOTE — Telephone Encounter (Signed)
Patient has been scheduled with a 8:10 appt on 7/8

## 2021-01-18 NOTE — Telephone Encounter (Signed)
Copied from [place] [phone]. Topic: General - Other >> Jan 16, 2021  9:22 AM Leward Quan A wrote: Reason for CRM: patient called in to inform Dr Wynetta Emery that she is still having the cloudy urine and may have a UTI and is tired of these symptoms. Asking for some help please call  Ph# [phone]

## 2021-01-19 ENCOUNTER — Other Ambulatory Visit: Payer: Self-pay

## 2021-01-19 ENCOUNTER — Ambulatory Visit: Payer: Medicaid Other | Attending: Internal Medicine | Admitting: Internal Medicine

## 2021-01-19 DIAGNOSIS — N3000 Acute cystitis without hematuria: Secondary | ICD-10-CM

## 2021-01-19 MED ORDER — AMOXICILLIN-POT CLAVULANATE 500-125 MG PO TABS
1.0000 | ORAL_TABLET | Freq: Two times a day (BID) | ORAL | 0 refills | Status: DC
Start: 2021-01-19 — End: 2021-01-29

## 2021-01-19 NOTE — Progress Notes (Signed)
Virtual Visit via Telephone Note  I connected with [name] [name] on 01/19/2021 at 8:41 a.m by telephone and verified that I am speaking with the correct person using two identifiers  Location: Patient: home Provider: office  Participants: Myself Patient   I discussed the limitations, risks, security and privacy concerns of performing an evaluation and management service by telephone and the availability of in person appointments. I also discussed with the patient that there may be a patient responsible charge related to this service. The patient expressed understanding and agreed to proceed.   History of Present Illness: Patient with history of pre-DM, GERD, hypothyroidism, HTN, HL, CAD with previous stent, COPD, substance abuse, depression/anxiety and tobacco dependence.    "I have a urinary tract infection." C/o cloudy urine and pain with urination.  No blood in the urine. Tried drinking more water and cranberry juice Treated for UTI 11/2020 with Macrobid.  Feels it did not clear the infection.  Urine culture was positive for E. coli that is resistant to Cipro.  Outpatient Encounter Medications as of 01/19/2021  Medication Sig   albuterol (VENTOLIN HFA) 108 (90 Base) MCG/ACT inhaler INHALE 2 PUFFS INTO THE LUNGS EVERY 6 (SIX) HOURS AS NEEDED FOR WHEEZING OR SHORTNESS OF BREATH.   amLODipine (NORVASC) 5 MG tablet TAKE 1 TABLET (5 MG TOTAL) BY MOUTH DAILY.   aspirin EC 81 MG tablet Take 81 mg by mouth daily.   atorvastatin (LIPITOR) 80 MG tablet Take 1 tablet (80 mg total) by mouth daily.   Blood Glucose Monitoring Suppl (TRUE METRIX METER) DEVI 1 kit by Does not apply route 4 (four) times daily.   busPIRone (BUSPAR) 15 MG tablet TAKE 2 TABLETS IN THE MORNING, 1 TAB AT NOON AND 1 TAB IN THE EVENING   cetirizine (ZYRTEC) 10 MG tablet Take 10 mg by mouth daily.   clonazePAM (KLONOPIN) 0.5 MG tablet Take 1 tablet (0.5 mg total) by mouth 2 (two) times daily as needed for anxiety.    cyclobenzaprine (FLEXERIL) 10 MG tablet TAKE 1 TABLET (10 MG TOTAL) BY MOUTH DAILY AS NEEDED.   doxepin (SINEQUAN) 50 MG capsule TAKE 1 CAPSULE (50 MG TOTAL) BY MOUTH AT BEDTIME.   escitalopram (LEXAPRO) 10 MG tablet TAKE 3 TABLETS (30 MG TOTAL) BY MOUTH DAILY.   Fluticasone-Umeclidin-Vilant (TRELEGY ELLIPTA) 100-62.5-25 MCG/INH AEPB Inhale 1 puff into the lungs daily.   furosemide (LASIX) 20 MG tablet Take 1 tablet (20 mg total) by mouth daily.   gabapentin (NEURONTIN) 600 MG tablet TAKE 1 TABLET BY MOUTH 4 TIMES DAILY   glucose blood (TRUE METRIX BLOOD GLUCOSE TEST) test strip Use as instructed   hydrocortisone (ANUSOL-HC) 2.5 % rectal cream PLACE 1 APPLICATION RECTALLY 2 (TWO) TIMES DAILY.   ipratropium-albuterol (DUONEB) 0.5-2.5 (3) MG/3ML SOLN TAKE 3 MLS VIA NEBULIZATION EVERY 6 HOURS   levothyroxine (SYNTHROID) 100 MCG tablet TAKE 1 TABLET (100 MCG TOTAL) BY MOUTH DAILY.   levothyroxine (SYNTHROID) 25 MCG tablet Take 1 tablet (25 mcg total) by mouth daily. In addition to 148mg tablet daily   losartan (COZAAR) 50 MG tablet TAKE 1 & 1/2 TABLETS BY MOUTH DAILY.   metFORMIN (GLUCOPHAGE) 500 MG tablet TAKE 1 TABLET (500 MG TOTAL) BY MOUTH 2 (TWO) TIMES DAILY WITH A MEAL.   mirtazapine (REMERON) 30 MG tablet TAKE 1 TABLET (30 MG TOTAL) BY MOUTH AT BEDTIME.   montelukast (SINGULAIR) 10 MG tablet TAKE 1 TABLET BY MOUTH AT BEDTIME.   nebivolol (BYSTOLIC) 10 MG tablet TAKE 1 TABLET (  10 MG TOTAL) BY MOUTH DAILY.   nitroGLYCERIN (NITROSTAT) 0.4 MG SL tablet Place 1 tablet (0.4 mg total) under the tongue every 5 (five) minutes as needed for chest pain.   omeprazole (PRILOSEC) 40 MG capsule TAKE 1 CAPSULE (40 MG TOTAL) BY MOUTH 2 (TWO) TIMES DAILY BEFORE A MEAL.   predniSONE (DELTASONE) 10 MG tablet Take 5 tablets (50 mg total) by mouth daily with breakfast.   ticagrelor (BRILINTA) 60 MG TABS tablet TAKE 1 TABLET (60 MG TOTAL) BY MOUTH 2 (TWO) TIMES DAILY.   triamcinolone cream (KENALOG) 0.1 % APPLY 1  APPLICATION TOPICALLY 2 TIMES DAILY.   TRUEPLUS LANCETS 28G MISC 28 g by Does not apply route QID.   Vitamin E 180 MG CAPS Take 180 mg by mouth daily.   No facility-administered encounter medications on file as of 01/19/2021.      Observations/Objective: No direct observation done as this was a telephone encounter.  Assessment and Plan: 1. Acute cystitis without hematuria Will treat for cystitis.  Last urine culture revealed E. coli that was sensitive to Augmentin.  I will place her on Augmentin for 7 days.  Patient told to follow-up if symptoms do not resolve with antibiotics. - amoxicillin-clavulanate (AUGMENTIN) 500-125 MG tablet; Take 1 tablet (500 mg total) by mouth 2 (two) times daily.  Dispense: 14 tablet; Refill: 0   Follow Up Instructions: As needed   I discussed the assessment and treatment plan with the patient. The patient was provided an opportunity to ask questions and all were answered. The patient agreed with the plan and demonstrated an understanding of the instructions.   The patient was advised to call back or seek an in-person evaluation if the symptoms worsen or if the condition fails to improve as anticipated.  I  Spent 5 minutes on this telephone encounter  Karle Plumber, MD

## 2021-01-22 ENCOUNTER — Other Ambulatory Visit: Payer: Self-pay | Admitting: Internal Medicine

## 2021-01-22 ENCOUNTER — Other Ambulatory Visit: Payer: Self-pay

## 2021-01-22 DIAGNOSIS — J449 Chronic obstructive pulmonary disease, unspecified: Secondary | ICD-10-CM

## 2021-01-22 MED ORDER — ALBUTEROL SULFATE HFA 108 (90 BASE) MCG/ACT IN AERS
INHALATION_SPRAY | RESPIRATORY_TRACT | 2 refills | Status: DC
  Filled 2021-01-22: qty 18, 25d supply, fill #0
  Filled 2021-03-23: qty 18, 25d supply, fill #1
  Filled 2021-05-02: qty 18, 25d supply, fill #2

## 2021-01-22 MED FILL — Ticagrelor Tab 60 MG: ORAL | 30 days supply | Qty: 60 | Fill #2 | Status: AC

## 2021-01-22 MED FILL — Amlodipine Besylate Tab 5 MG (Base Equivalent): ORAL | 30 days supply | Qty: 30 | Fill #3 | Status: CN

## 2021-01-23 ENCOUNTER — Other Ambulatory Visit: Payer: Self-pay

## 2021-01-24 NOTE — Telephone Encounter (Signed)
Patient is sending her daughter [name] [name] to pick up [employer] form today, please have it at the front desk ready for pick up

## 2021-01-24 NOTE — Telephone Encounter (Signed)
Patient came to clinic and provided her with a copy of Part B of the [employer] application.  She stated that she was going to take it to the [place] office this afternoon  She said that [employer] [facility] never received the application despite it being faxed to their office twice and both transmissions were confirmed

## 2021-01-26 ENCOUNTER — Other Ambulatory Visit: Payer: Self-pay

## 2021-01-26 MED FILL — Amlodipine Besylate Tab 5 MG (Base Equivalent): ORAL | 30 days supply | Qty: 30 | Fill #3 | Status: AC

## 2021-01-29 ENCOUNTER — Other Ambulatory Visit: Payer: Self-pay

## 2021-01-29 ENCOUNTER — Ambulatory Visit (AMBULATORY_SURGERY_CENTER): Payer: Self-pay | Admitting: *Deleted

## 2021-01-29 VITALS — Ht 61.0 in | Wt 135.0 lb

## 2021-01-29 DIAGNOSIS — R194 Change in bowel habit: Secondary | ICD-10-CM

## 2021-01-29 DIAGNOSIS — R634 Abnormal weight loss: Secondary | ICD-10-CM

## 2021-01-29 DIAGNOSIS — K625 Hemorrhage of anus and rectum: Secondary | ICD-10-CM

## 2021-01-29 DIAGNOSIS — R1319 Other dysphagia: Secondary | ICD-10-CM

## 2021-01-29 DIAGNOSIS — R109 Unspecified abdominal pain: Secondary | ICD-10-CM

## 2021-01-29 MED ORDER — SUPREP BOWEL PREP KIT 17.5-3.13-1.6 GM/177ML PO SOLN
1.0000 | Freq: Once | ORAL | 0 refills | Status: AC
  Filled 2021-01-29: qty 354, 1d supply, fill #0

## 2021-01-29 NOTE — Progress Notes (Signed)
No egg or soy allergy known to patient  No issues with past sedation with any surgeries or procedures Patient denies ever being told they had issues or difficulty with intubation  No FH of Malignant Hyperthermia No diet pills per patient No home 02 use per patient  On  blood thinners per patient - Brilinta- we have 5 day hold per Cardio  Pt denies issues with constipation  No A fib or A flutter  EMMI video to pt or via [place] 19 guidelines implemented in PV today with Pt and RN  Pt is fully vaccinated  for Covid   Due to the COVID-19 pandemic we are asking patients to follow certain guidelines.  Pt aware of COVID protocols and [facility] guidelines   Pt states she is having dysphagia to liquids- ? Dilation with EGD  Daughter in [place] with pt today

## 2021-01-30 ENCOUNTER — Other Ambulatory Visit: Payer: Self-pay

## 2021-02-07 ENCOUNTER — Telehealth: Payer: Self-pay

## 2021-02-07 NOTE — Telephone Encounter (Signed)
Call received from [facility], [employer] who confirmed that she has received patient's application and it is being processed

## 2021-02-10 ENCOUNTER — Emergency Department (HOSPITAL_COMMUNITY)
Admission: EM | Admit: 2021-02-10 | Discharge: 2021-02-11 | Disposition: A | Discharge status: Home / Self Care | Payer: Medicaid Other | Attending: Emergency Medicine | Admitting: Emergency Medicine

## 2021-02-10 DIAGNOSIS — Z7982 Long term (current) use of aspirin: Secondary | ICD-10-CM | POA: Insufficient documentation

## 2021-02-10 DIAGNOSIS — E1122 Type 2 diabetes mellitus with diabetic chronic kidney disease: Secondary | ICD-10-CM | POA: Insufficient documentation

## 2021-02-10 DIAGNOSIS — N189 Chronic kidney disease, unspecified: Secondary | ICD-10-CM | POA: Insufficient documentation

## 2021-02-10 DIAGNOSIS — Z79899 Other long term (current) drug therapy: Secondary | ICD-10-CM | POA: Insufficient documentation

## 2021-02-10 DIAGNOSIS — F17210 Nicotine dependence, cigarettes, uncomplicated: Secondary | ICD-10-CM | POA: Insufficient documentation

## 2021-02-10 DIAGNOSIS — Z20822 Contact with and (suspected) exposure to covid-19: Secondary | ICD-10-CM | POA: Insufficient documentation

## 2021-02-10 DIAGNOSIS — J441 Chronic obstructive pulmonary disease with (acute) exacerbation: Secondary | ICD-10-CM | POA: Insufficient documentation

## 2021-02-10 DIAGNOSIS — Z7952 Long term (current) use of systemic steroids: Secondary | ICD-10-CM | POA: Insufficient documentation

## 2021-02-10 DIAGNOSIS — I129 Hypertensive chronic kidney disease with stage 1 through stage 4 chronic kidney disease, or unspecified chronic kidney disease: Secondary | ICD-10-CM | POA: Insufficient documentation

## 2021-02-10 DIAGNOSIS — R1013 Epigastric pain: Secondary | ICD-10-CM | POA: Insufficient documentation

## 2021-02-10 DIAGNOSIS — E039 Hypothyroidism, unspecified: Secondary | ICD-10-CM | POA: Insufficient documentation

## 2021-02-10 DIAGNOSIS — Z7984 Long term (current) use of oral hypoglycemic drugs: Secondary | ICD-10-CM | POA: Insufficient documentation

## 2021-02-10 DIAGNOSIS — I2510 Atherosclerotic heart disease of native coronary artery without angina pectoris: Secondary | ICD-10-CM | POA: Insufficient documentation

## 2021-02-10 DIAGNOSIS — J45909 Unspecified asthma, uncomplicated: Secondary | ICD-10-CM | POA: Insufficient documentation

## 2021-02-10 DIAGNOSIS — Z91040 Latex allergy status: Secondary | ICD-10-CM | POA: Insufficient documentation

## 2021-02-10 NOTE — ED Provider Notes (Signed)
Emergency Medicine Provider Triage Evaluation Note  [name] [name] , a 65 y.o. female  was evaluated in triage.  Pt complains of SOB.  Feels like asthma is acting up.  Has been using home inhalers without relief.  Received albuterol, duoneb, and '125mg'$  solumedrol with EMS.  Hx of asthma, COPD.  Does not require home O2, currently on 4L with EMS as still feeling SOB.  Review of Systems  Positive: Sob, chest pain Negative: Fever, chills  Physical Exam  BP (!) 164/90 (BP Location: Left Arm)   Pulse (!) 101   Temp 98.2 F (36.8 C) (Oral)   Resp (!) 24   Ht '5\' 1"'$  (1.549 m)   Wt 61.2 kg   SpO2 100%   BMI 25.51 kg/m   Gen:   Awake, no distress   Resp:  Increased WOB, decreased air movement, speaking in shortened, truncated sentences, 4L supplemental O2 in use MSK:   Moves extremities without difficulty  Other:    Medical Decision Making  Medically screening exam initiated at 11:57 PM.  Appropriate orders placed.  [name] [name] was informed that the remainder of the evaluation will be completed by another provider, this initial triage assessment does not replace that evaluation, and the importance of remaining in the ED until their evaluation is complete.  Suspect COPD/asthma exacerbation.  Still labored in triage, will expedite room assignment.   [name] Pickett, PA-C 02/11/21 0007    [name] Greek, MD 02/11/21 [phone]

## 2021-02-10 NOTE — ED Triage Notes (Signed)
Pt BIB EMS from  home.  Has been  having increased SHOB since before lunch today.  Has taken both of her inhalers and her home nebs with no relief.  Has had duoneb, albuterol neb, 125 solu medrol with EMS.  20G RAC .  Hx of asthma, COPD, MI.  Smoker.

## 2021-02-11 ENCOUNTER — Other Ambulatory Visit: Payer: Self-pay

## 2021-02-11 ENCOUNTER — Emergency Department (HOSPITAL_COMMUNITY): Payer: Medicaid Other

## 2021-02-11 LAB — CBC WITH DIFFERENTIAL/PLATELET
Abs Immature Granulocytes: 0.09 10*3/uL — ABNORMAL HIGH (ref 0.00–0.07)
Basophils Absolute: 0.1 10*3/uL (ref 0.0–0.1)
Basophils Relative: 0 %
Eosinophils Absolute: 0 10*3/uL (ref 0.0–0.5)
Eosinophils Relative: 0 %
HCT: 44.9 % (ref 36.0–46.0)
Hemoglobin: 15.2 g/dL — ABNORMAL HIGH (ref 12.0–15.0)
Immature Granulocytes: 0 %
Lymphocytes Relative: 6 %
Lymphs Abs: 1.3 10*3/uL (ref 0.7–4.0)
MCH: 33.1 pg (ref 26.0–34.0)
MCHC: 33.9 g/dL (ref 30.0–36.0)
MCV: 97.8 fL (ref 80.0–100.0)
Monocytes Absolute: 2 10*3/uL — ABNORMAL HIGH (ref 0.1–1.0)
Monocytes Relative: 9 %
Neutro Abs: 19.1 10*3/uL — ABNORMAL HIGH (ref 1.7–7.7)
Neutrophils Relative %: 85 %
Platelets: 437 10*3/uL — ABNORMAL HIGH (ref 150–400)
RBC: 4.59 MIL/uL (ref 3.87–5.11)
RDW: 15.9 % — ABNORMAL HIGH (ref 11.5–15.5)
WBC: 22.6 10*3/uL — ABNORMAL HIGH (ref 4.0–10.5)
nRBC: 0 % (ref 0.0–0.2)

## 2021-02-11 LAB — HEPATIC FUNCTION PANEL
ALT: 14 U/L (ref 0–44)
AST: 21 U/L (ref 15–41)
Albumin: 4.1 g/dL (ref 3.5–5.0)
Alkaline Phosphatase: 97 U/L (ref 38–126)
Bilirubin, Direct: 0.2 mg/dL (ref 0.0–0.2)
Indirect Bilirubin: 0.7 mg/dL (ref 0.3–0.9)
Total Bilirubin: 0.9 mg/dL (ref 0.3–1.2)
Total Protein: 7.4 g/dL (ref 6.5–8.1)

## 2021-02-11 LAB — RESP PANEL BY RT-PCR (FLU A&B, COVID) ARPGX2
Influenza A by PCR: NEGATIVE
Influenza B by PCR: NEGATIVE
SARS Coronavirus 2 by RT PCR: NEGATIVE

## 2021-02-11 LAB — D-DIMER, QUANTITATIVE: D-Dimer, Quant: 0.41 ug/mL-FEU (ref 0.00–0.50)

## 2021-02-11 LAB — BASIC METABOLIC PANEL
Anion gap: 11 (ref 5–15)
BUN: 9 mg/dL (ref 8–23)
CO2: 24 mmol/L (ref 22–32)
Calcium: 9.5 mg/dL (ref 8.9–10.3)
Chloride: 104 mmol/L (ref 98–111)
Creatinine, Ser: 0.81 mg/dL (ref 0.44–1.00)
GFR, Estimated: >60 mL/min (ref >60)
Glucose, Bld: 185 mg/dL — ABNORMAL HIGH (ref 70–99)
Potassium: 3.9 mmol/L (ref 3.5–5.1)
Sodium: 139 mmol/L (ref 135–145)

## 2021-02-11 LAB — TROPONIN I (HIGH SENSITIVITY)
Troponin I (High Sensitivity): 9 ng/L (ref <18)
Troponin I (High Sensitivity): 8 ng/L (ref <18)

## 2021-02-11 LAB — LIPASE, BLOOD: Lipase: 23 U/L (ref 11–51)

## 2021-02-11 LAB — BRAIN NATRIURETIC PEPTIDE: B Natriuretic Peptide: 97.6 pg/mL (ref 0.0–100.0)

## 2021-02-11 MED ORDER — ALBUTEROL SULFATE (2.5 MG/3ML) 0.083% IN NEBU
5.0000 mg | INHALATION_SOLUTION | Freq: Once | RESPIRATORY_TRACT | Status: AC
  Administered 2021-02-11: 5 mg via RESPIRATORY_TRACT
  Filled 2021-02-11: qty 6

## 2021-02-11 MED ORDER — AMOXICILLIN-POT CLAVULANATE 875-125 MG PO TABS: 1.0000 | ORAL_TABLET | Freq: Two times a day (BID) | ORAL | 0 refills | Status: DC

## 2021-02-11 MED ORDER — IPRATROPIUM-ALBUTEROL 0.5-2.5 (3) MG/3ML IN SOLN
3.0000 mL | Freq: Once | RESPIRATORY_TRACT | Status: AC
  Administered 2021-02-11: 3 mL via RESPIRATORY_TRACT
  Filled 2021-02-11: qty 3

## 2021-02-11 MED ORDER — DOXYCYCLINE HYCLATE 100 MG PO CAPS: 100.0000 mg | ORAL_CAPSULE | Freq: Two times a day (BID) | ORAL | 0 refills | Status: DC

## 2021-02-11 MED ORDER — PREDNISONE 10 MG PO TABS: 40.0000 mg | ORAL_TABLET | Freq: Every day | ORAL | 0 refills | Status: DC

## 2021-02-11 NOTE — ED Notes (Signed)
Patient ambulated in hallway with no assistance; Patient's sats between 93-95% while in motion; Pt reported she still felt weak but has stead slow gait-Monique,RN

## 2021-02-11 NOTE — ED Notes (Signed)
ED Provider at bedside. 

## 2021-02-11 NOTE — Discharge Instructions (Addendum)
Take your medications as prescribed. Continue to use your albuterol nebulizer as well as your inhalers at home. Get rechecked if your breathing becomes labored or if you have new concerning symptoms.

## 2021-02-11 NOTE — ED Provider Notes (Signed)
[facility] EMERGENCY DEPARTMENT Provider Note   CSN: [id number] Arrival date & time: 02/10/21  2358     History Chief Complaint  Patient presents with   Shortness of Breath    [name] [name] is a 66 y.o. female.  The history is provided by the patient and medical records.  Shortness of Breath [name] [name] is a 64 y.o. female who presents to the Emergency Department complaining of sob.  She presents to the ED complaining of sob that started Saturday afternoon.  Sxs began suddenly (describes as asthma attack).  She reports sob, chest tightness (temporarily - now gone), cough productive of yellow sputum.  She used her home MDI and nebulizer with persistent sxs.  Received solumedrol PTA by EMS.  Does not use oxygen at home.    No fever, V/D, leg swelling pain.  Had nausea.      Past Medical History:  Diagnosis Date   Allergy    seasonal   Anxiety    takes Lexapro daily   Arthritis    "back, from neck down pass my bra area" (03/25/2017)   Asthma    Bartholin gland cyst 08/29/2011   Bruises easily    pt is on Effient   Chronic back pain    herniated nucleus pulposus   Chronic back pain    "neck to bra area; lower back" (03/25/2017)   Chronic kidney disease    recurrent UTI's this year 2022   COPD (chronic obstructive pulmonary disease) (HCC)    early stages   Coronary artery disease    Depression    takes Klonopin daily   Diabetes mellitus without complication (HCC)    Diverticulosis    GERD (gastroesophageal reflux disease)    takes Nexium daily   H/O hiatal hernia    Heart attack ([place]) 2011   Hemorrhoids    Hernia    Hyperlipidemia    takes Lipitor daily   Hypertension    takes Losartan daily and Labetalol bid   Hypothyroidism    takes Synthroid daily   Insomnia    hydroxyzine prn   Joint pain    Pneumonia    "couple times" (03/25/2017)   Pre-diabetes    "just found out 1 wk ago" (03/25/2017)   Psoriasis    elbows,knees,back   Shortness  of breath    with exertion   Slowing of urinary stream    Stress incontinence     Patient Active Problem List   Diagnosis Date Noted   GAD (generalized anxiety disorder) 01/08/2021   Acute cystitis without hematuria 11/07/2020   Influenza vaccine needed 06/15/2020   Abdominal pain, epigastric 04/28/2019   Esophageal dysphagia 04/28/2019   Bipolar I disorder ([place]) 09/17/2018   Insomnia 10/28/2017   Prediabetes 05/28/2017   QT prolongation 03/25/2017   Encounter for screening mammogram for breast cancer 09/11/2015   Psoriasis 06/22/2015   COPD (chronic obstructive pulmonary disease) (HCC)GOLD C  05/18/2015   Anxiety and depression 07/10/2013   Essential hypertension, benign 06/07/2013   Dyslipidemia 06/07/2013   Asthma, chronic 06/07/2013   Emphysema lung ([place]) 06/07/2013   Chronic back pain 06/07/2013   CAD S/P percutaneous coronary angioplasty 06/07/2013   GERD (gastroesophageal reflux disease) 06/07/2013   Breast lump on left side at 3 o'clock position 10/13/2012   S/P abdominal hysterectomy and right salpingo-oophorectomy 08/29/2011   COPD exacerbation ([place]) 09/15/2009   TOBACCO ABUSE 07/17/2009   Chronic rhinitis 07/17/2009   Lung nodule < 6cm on CT  07/17/2009   ALLERGY, FOOD 07/17/2009   Hypothyroidism 12/22/2008    Past Surgical History:  Procedure Laterality Date   [place]   "left one of my ovaries"   BACK SURGERY     BIOPSY  05/27/2019   Procedure: BIOPSY;  Surgeon: Daneil Dolin, MD;  [facility];  Service: Endoscopy;;   COLONOSCOPY  2011   Dr. Ardis Hughs: Mild diverticulosis, descending diminutive colon polyp (not retrieved), next colonoscopy 10 years   CORONARY ANGIOPLASTY WITH STENT PLACEMENT  2011 X2   "regular stents didn't work; had to go back in in ~ 1 month and put in medicated stents"   [place]     ESOPHAGOGASTRODUODENOSCOPY     ESOPHAGOGASTRODUODENOSCOPY  (EGD) WITH PROPOFOL N/A 05/27/2019   normal esophagus, dilation, erosive gastropathy s/p biopsy, normal duodenum. Negative H.pylori.    LEFT HEART CATH AND CORONARY ANGIOGRAPHY N/A 03/26/2017   Procedure: LEFT HEART CATH AND CORONARY ANGIOGRAPHY;  Surgeon: Belva Crome, MD;  Location: [facility];  Service: Cardiovascular;  Laterality: N/A;   LUMBAR LAMINECTOMY/DECOMPRESSION MICRODISCECTOMY  08/05/2011   Procedure: LUMBAR LAMINECTOMY/DECOMPRESSION MICRODISCECTOMY;  Surgeon: Otilio Connors, MD;  Location: [facility];  Service: Neurosurgery;  Laterality: Right;  Right Lumbar four-five extraforaminal discectomy   MALONEY DILATION N/A 05/27/2019   Procedure: Venia Minks DILATION;  Surgeon: Daneil Dolin, MD;  [facility]: [facility];  Service: Endoscopy;  Laterality: N/A;  55   TONSILLECTOMY     as a child   UPPER GASTROINTESTINAL ENDOSCOPY       OB History     Gravida  1   Para  1   Term  1   Preterm  0   AB  0   Living  1      SAB  0   IAB  0   Ectopic  0   Multiple  0   Live Births              Family History  Problem Relation Age of Onset   Heart disease Mother    Hypertension Mother    Stroke Mother    Mental illness Mother    Heart disease Father    Hypertension Father    Diabetes Father    Colon polyps Brother    Breast cancer Maternal Aunt    Throat cancer Maternal Uncle    Thyroid disease Paternal Aunt    Heart disease Maternal Grandfather    Cancer Paternal Grandmother        mouth   Anesthesia problems Daughter    Hypotension Neg Hx    Malignant hyperthermia Neg Hx    Pseudochol deficiency Neg Hx    Colon cancer Neg Hx    Stomach cancer Neg Hx    Esophageal cancer Neg Hx    Pancreatic cancer Neg Hx    Rectal cancer Neg Hx     Social History   Tobacco Use   Smoking status: Every Day    Packs/day: 1.00    Years: 50.00    Pack years: 50.00    Types: Cigarettes   Smokeless tobacco: Never  Vaping Use   Vaping Use: Never used   Substance Use Topics   Alcohol use: Yes    Comment: once a week   Drug use: No    Types: Cocaine    Comment: has been years per pt    Home Medications  Prior to Admission medications   Medication Sig Start Date End Date Taking? Authorizing Provider  amoxicillin-clavulanate (AUGMENTIN) 875-125 MG tablet Take 1 tablet by mouth every 12 (twelve) hours. 02/11/21  Yes Quintella Reichert, MD  doxycycline (VIBRAMYCIN) 100 MG capsule Take 1 capsule (100 mg total) by mouth 2 (two) times daily. 02/11/21  Yes Quintella Reichert, MD  predniSONE (DELTASONE) 10 MG tablet Take 4 tablets (40 mg total) by mouth daily. 02/11/21  Yes Quintella Reichert, MD  albuterol (VENTOLIN HFA) 108 (90 Base) MCG/ACT inhaler INHALE 2 PUFFS INTO THE LUNGS EVERY 6 (SIX) HOURS AS NEEDED FOR WHEEZING OR SHORTNESS OF BREATH. 01/22/21   Ladell Pier, MD  amLODipine (NORVASC) 5 MG tablet TAKE 1 TABLET (5 MG TOTAL) BY MOUTH DAILY. 06/15/20 06/15/21  Ladell Pier, MD  aspirin EC 81 MG tablet Take 81 mg by mouth daily.    [provider]  atorvastatin (LIPITOR) 80 MG tablet Take 1 tablet (80 mg total) by mouth daily. 12/06/20 03/06/21  Camillia Herter, NP  Blood Glucose Monitoring Suppl (TRUE METRIX METER) DEVI 1 kit by Does not apply route 4 (four) times daily. 04/04/17   Brayton Caves, PA-C  busPIRone (BUSPAR) 15 MG tablet TAKE 2 TABLETS IN THE MORNING, 1 TAB AT NOON AND 1 TAB IN THE EVENING Patient taking differently: Take 3 at bedtime 01/08/21 01/08/22  [place] Crane, MD  cetirizine (ZYRTEC) 10 MG tablet Take 10 mg by mouth as needed.    [provider]  clonazePAM (KLONOPIN) 0.5 MG tablet Take 1 tablet (0.5 mg total) by mouth 2 (two) times daily as needed for anxiety. 01/08/21   [place] Crane, MD  cyclobenzaprine (FLEXERIL) 10 MG tablet TAKE 1 TABLET (10 MG TOTAL) BY MOUTH DAILY AS NEEDED. 06/15/20 06/15/21  Ladell Pier, MD  doxepin (SINEQUAN) 50 MG capsule TAKE 1 CAPSULE (50 MG TOTAL) BY MOUTH AT BEDTIME.  01/08/21 01/08/22  [place] Crane, MD  escitalopram (LEXAPRO) 10 MG tablet TAKE 3 TABLETS (30 MG TOTAL) BY MOUTH DAILY. 01/08/21 01/08/22  [place] Crane, MD  Fluticasone-Umeclidin-Vilant (TRELEGY ELLIPTA) 100-62.5-25 MCG/INH AEPB Inhale 1 puff into the lungs daily. 11/07/20   Elsie Stain, MD  furosemide (LASIX) 20 MG tablet Take 1 tablet (20 mg total) by mouth daily. 07/24/20   Ladell Pier, MD  gabapentin (NEURONTIN) 600 MG tablet TAKE 1 TABLET BY MOUTH 4 TIMES DAILY 09/29/20 09/29/21  Ladell Pier, MD  glucose blood (TRUE METRIX BLOOD GLUCOSE TEST) test strip Use as instructed 01/21/18   Ladell Pier, MD  hydrocortisone (ANUSOL-HC) 2.5 % rectal cream PLACE 1 APPLICATION RECTALLY 2 (TWO) TIMES DAILY. Patient not taking: Reported on 01/29/2021 05/21/20 05/21/21  Isla Pence, MD  ipratropium-albuterol (DUONEB) 0.5-2.5 (3) MG/3ML SOLN TAKE 3 MLS VIA NEBULIZATION EVERY 6 HOURS Patient taking differently: TAKE 3 MLS VIA NEBULIZATION EVERY 6 HOURS as needed 11/18/19   Elsie Stain, MD  levothyroxine (SYNTHROID) 100 MCG tablet TAKE 1 TABLET (100 MCG TOTAL) BY MOUTH DAILY. 06/15/20 06/15/21  Ladell Pier, MD  levothyroxine (SYNTHROID) 25 MCG tablet Take 1 tablet (25 mcg total) by mouth daily. In addition to 126mg tablet daily Patient not taking: Reported on 01/29/2021 11/28/20   MArgentina Donovan PA-C  losartan (COZAAR) 50 MG tablet TAKE 1 & 1/2 TABLETS BY MOUTH DAILY. 06/15/20 06/15/21  JLadell Pier MD  metFORMIN (GLUCOPHAGE) 500 MG tablet TAKE 1 TABLET (500 MG TOTAL) BY MOUTH 2 (TWO) TIMES DAILY WITH A MEAL. 11/28/20 11/28/21  Freeman Caldron M, PA-C  mirtazapine (REMERON) 30 MG tablet TAKE 1 TABLET (30 MG TOTAL) BY MOUTH AT BEDTIME. 01/02/21 01/02/22  [place] Crane, MD  montelukast (SINGULAIR) 10 MG tablet TAKE 1 TABLET BY MOUTH AT BEDTIME. 11/28/20 11/28/21  Elsie Stain, MD  nebivolol (BYSTOLIC) 10 MG tablet TAKE 1 TABLET (10 MG TOTAL) BY MOUTH DAILY. 06/15/20 06/15/21   Ladell Pier, MD  nitroGLYCERIN (NITROSTAT) 0.4 MG SL tablet Place 1 tablet (0.4 mg total) under the tongue every 5 (five) minutes as needed for chest pain. 11/08/19 01/29/21  Kroeger, Lorelee Cover., PA-C  omeprazole (PRILOSEC) 40 MG capsule TAKE 1 CAPSULE (40 MG TOTAL) BY MOUTH 2 (TWO) TIMES DAILY BEFORE A MEAL. 10/25/20 10/25/21  Ladell Pier, MD  ticagrelor (BRILINTA) 60 MG TABS tablet TAKE 1 TABLET (60 MG TOTAL) BY MOUTH 2 (TWO) TIMES DAILY. 06/15/20 06/15/21  Ladell Pier, MD  triamcinolone cream (KENALOG) 0.1 % APPLY 1 APPLICATION TOPICALLY 2 TIMES DAILY. 06/19/18   Ladell Pier, MD  TRUEPLUS LANCETS 28G MISC 28 g by Does not apply route QID. 04/04/17   Brayton Caves, PA-C  Vitamin E 180 MG CAPS Take 180 mg by mouth daily.    [provider]    Allergies    Avocado, Latex, and Codeine  Review of Systems   Review of Systems  Respiratory:  Positive for shortness of breath.   All other systems reviewed and are negative.  Physical Exam Updated Vital Signs BP (!) 144/75 (BP Location: Left Arm)   Pulse 70   Temp 97.8 F (36.6 C) (Oral)   Resp 18   Ht '5\' 1"'  (1.549 m)   Wt 61.2 kg   SpO2 97%   BMI 25.51 kg/m   Physical Exam Vitals and nursing note reviewed.  Constitutional:      Appearance: She is well-developed.  HENT:     Head: Normocephalic and atraumatic.  Cardiovascular:     Rate and Rhythm: Normal rate and regular rhythm.     Heart sounds: No murmur heard. Pulmonary:     Effort: Pulmonary effort is normal. No respiratory distress.     Comments: Decreased air movement in bilaterally lung bases with fine crackles in the right lung base Abdominal:     Palpations: Abdomen is soft.     Tenderness: There is no guarding or rebound.     Comments: Mild epigastric tenderness  Musculoskeletal:        General: No swelling or tenderness.  Skin:    General: Skin is warm and dry.  Neurological:     Mental Status: She is alert and oriented to person,  place, and time.  Psychiatric:        Behavior: Behavior normal.    ED Results / Procedures / Treatments   Labs (all labs ordered are listed, but only abnormal results are displayed) Labs Reviewed  CBC WITH DIFFERENTIAL/PLATELET - Abnormal; Notable for the following components:      Result Value   WBC 22.6 (*)    Hemoglobin 15.2 (*)    RDW 15.9 (*)    Platelets 437 (*)    Neutro Abs 19.1 (*)    Monocytes Absolute 2.0 (*)    Abs Immature Granulocytes 0.09 (*)    All other components within normal limits  BASIC METABOLIC PANEL - Abnormal; Notable for the following components:   Glucose, Bld 185 (*)    All other components within normal limits  RESP PANEL BY RT-PCR (FLU A&B,  COVID) ARPGX2  D-DIMER, QUANTITATIVE  HEPATIC FUNCTION PANEL  LIPASE, BLOOD  BRAIN NATRIURETIC PEPTIDE  TROPONIN I (HIGH SENSITIVITY)  TROPONIN I (HIGH SENSITIVITY)    EKG EKG Interpretation  Date/Time:  "Sunday February 11 2021 03:21:15 EDT Ventricular Rate:  85 PR Interval:  155 QRS Duration: 139 QT Interval:  444 QTC Calculation: 528 R Axis:   90 Text Interpretation: Sinus rhythm Anterior infarct, old Prolonged QT interval Confirmed by Leen Tworek ([id number]) on 02/11/2021 3:35:56 AM  Radiology DG Chest Port 1 View  Result Date: 02/11/2021 CLINICAL DATA:  Dyspnea, chest pain EXAM: PORTABLE CHEST 1 VIEW COMPARISON:  10/23/2020 FINDINGS: The heart size and mediastinal contours are within normal limits. Both lungs are clear. The visualized skeletal structures are unremarkable. IMPRESSION: No active disease. Electronically Signed   By: Ashesh  Parikh MD   On: 02/11/2021 03:54    Procedures Procedures   Medications Ordered in ED Medications  ipratropium-albuterol (DUONEB) 0.5-2.5 (3) MG/3ML nebulizer solution 3 mL (3 mLs Nebulization Given 02/11/21 0019)  albuterol (PROVENTIL) (2.5 MG/3ML) 0.083% nebulizer solution 5 mg (5 mg Nebulization Given 02/11/21 0400)    ED Course  I have reviewed the triage  vital signs and the nursing notes.  Pertinent labs & imaging results that were available during my care of the patient were reviewed by me and considered in my medical decision making (see chart for details).    MDM Rules/Calculators/A&P                          patient with history of COPD here for evaluation of increased shortness of breath since yesterday. On ED presentation patient with increased work of breathing. She did receive Solu-Medrol and albuterol prior to arrival. On my assessment patient's work of breathing is only mildly increased. After albuterol her symptoms continued to improve. Wheezes resolved and she had good air movement bilaterally. She did have occasional crackles. Chest x-ray without evidence of pneumonia but given lung findings and elevated white blood cell count will treat with antibiotics for possible developing pneumonia. Presentation is not consistent with ACS, CHF, PE. Discussed with patient home care for COPD exacerbation, possible early pneumonia. Discussed outpatient follow-up and return precautions.  Final Clinical Impression(s) / ED Diagnoses Final diagnoses:  COPD exacerbation (HCC)    Rx / DC Orders ED Discharge Orders          Ordered    predniSONE (DELTASONE) 10 MG tablet  Daily        02/11/21 0647    doxycycline (VIBRAMYCIN) 100 MG capsule  2 times daily        02/11/21 0647    amoxicillin-clavulanate (AUGMENTIN) 875-125 MG tablet  Every 12 hours        07" /31/22 7416             Quintella Reichert, MD 02/11/21 [phone]

## 2021-02-12 ENCOUNTER — Other Ambulatory Visit: Payer: Self-pay

## 2021-02-12 ENCOUNTER — Other Ambulatory Visit: Payer: Self-pay | Admitting: Internal Medicine

## 2021-02-12 DIAGNOSIS — K219 Gastro-esophageal reflux disease without esophagitis: Secondary | ICD-10-CM

## 2021-02-12 MED ORDER — OMEPRAZOLE 40 MG PO CPDR
DELAYED_RELEASE_CAPSULE | ORAL | 0 refills | Status: DC
  Filled 2021-02-12: qty 60, 30d supply, fill #0
  Filled 2021-03-23: qty 60, 30d supply, fill #1

## 2021-02-12 MED FILL — Cyclobenzaprine HCl Tab 10 MG: ORAL | 30 days supply | Qty: 30 | Fill #1 | Status: AC

## 2021-02-12 MED FILL — Nebivolol HCl Tab 10 MG (Base Equivalent): ORAL | 30 days supply | Qty: 30 | Fill #1 | Status: AC

## 2021-02-12 MED FILL — Losartan Potassium Tab 50 MG: ORAL | 30 days supply | Qty: 45 | Fill #2 | Status: AC

## 2021-02-12 NOTE — Telephone Encounter (Signed)
Requested Prescriptions  Pending Prescriptions Disp Refills  . omeprazole (PRILOSEC) 40 MG capsule 120 capsule 0    Sig: TAKE 1 CAPSULE (40 MG TOTAL) BY MOUTH 2 (TWO) TIMES DAILY BEFORE A MEAL.     Gastroenterology: Proton Pump Inhibitors Passed - 02/12/2021  3:28 PM      Passed - Valid encounter within last 12 months    Recent Outpatient Visits          3 weeks ago Acute cystitis without hematuria   [place], [place]   2 months ago [place], [place]   8 months ago Prediabetes   [place], MD   11 months ago COPD exacerbation [facility])   [place], [place]   11 months ago COPD exacerbation [facility])   [place], [place]      Future Appointments            In 2 weeks Elsie Stain, MD [place]

## 2021-02-13 ENCOUNTER — Other Ambulatory Visit: Payer: Self-pay

## 2021-02-15 ENCOUNTER — Other Ambulatory Visit: Payer: Self-pay

## 2021-02-19 ENCOUNTER — Encounter: Payer: Self-pay | Admitting: Gastroenterology

## 2021-02-19 ENCOUNTER — Other Ambulatory Visit: Payer: Self-pay

## 2021-02-19 ENCOUNTER — Ambulatory Visit (AMBULATORY_SURGERY_CENTER): Payer: Self-pay | Admitting: Gastroenterology

## 2021-02-19 VITALS — BP 120/61 | HR 57 | Temp 98.9°F | Resp 17 | Ht 61.0 in | Wt 135.0 lb

## 2021-02-19 DIAGNOSIS — R1319 Other dysphagia: Secondary | ICD-10-CM

## 2021-02-19 DIAGNOSIS — D125 Benign neoplasm of sigmoid colon: Secondary | ICD-10-CM

## 2021-02-19 DIAGNOSIS — D123 Benign neoplasm of transverse colon: Secondary | ICD-10-CM

## 2021-02-19 DIAGNOSIS — R109 Unspecified abdominal pain: Secondary | ICD-10-CM

## 2021-02-19 DIAGNOSIS — K625 Hemorrhage of anus and rectum: Secondary | ICD-10-CM

## 2021-02-19 DIAGNOSIS — K5730 Diverticulosis of large intestine without perforation or abscess without bleeding: Secondary | ICD-10-CM

## 2021-02-19 DIAGNOSIS — R194 Change in bowel habit: Secondary | ICD-10-CM

## 2021-02-19 DIAGNOSIS — R634 Abnormal weight loss: Secondary | ICD-10-CM

## 2021-02-19 DIAGNOSIS — K2970 Gastritis, unspecified, without bleeding: Secondary | ICD-10-CM

## 2021-02-19 DIAGNOSIS — K319 Disease of stomach and duodenum, unspecified: Secondary | ICD-10-CM

## 2021-02-19 MED ORDER — SODIUM CHLORIDE 0.9 % IV SOLN: 500.0000 mL | Freq: Once | INTRAVENOUS | Status: DC

## 2021-02-19 NOTE — Progress Notes (Signed)
CRNA advised that pt. Had cup of large ice cubes to moisten lips with.

## 2021-02-19 NOTE — Op Note (Signed)
[facility] Patient Name: [name] [name] Procedure Date: 02/19/2021 10:37 AM MRN: [id number] Endoscopist: Milus Banister , MD Age: 65 Referring MD:  Date of Birth: April 06, 1956 Gender: Female Account #: [id number] Procedure:                Upper GI endoscopy Indications:              Generalized abdominal pain Medicines:                Monitored Anesthesia Care Procedure:                Pre-Anesthesia Assessment:                           - Prior to the procedure, a History and Physical                            was performed, and patient medications and                            allergies were reviewed. The patient's tolerance of                            previous anesthesia was also reviewed. The risks                            and benefits of the procedure and the sedation                            options and risks were discussed with the patient.                            All questions were answered, and informed consent                            was obtained. Prior Anticoagulants: The patient has                            taken brilinta, last dose was 5 days prior to                            procedure. ASA Grade Assessment: III - A patient                            with severe systemic disease. After reviewing the                            risks and benefits, the patient was deemed in                            satisfactory condition to undergo the procedure.                           After obtaining informed consent, the endoscope was  passed under direct vision. Throughout the                            procedure, the patient's blood pressure, pulse, and                            oxygen saturations were monitored continuously. The                            Endoscope was introduced through the mouth, and                            advanced to the second part of duodenum. The upper                            GI endoscopy was  accomplished without difficulty.                            The patient tolerated the procedure well. Scope In: Scope Out: Findings:                 Mild inflammation characterized by erosions,                            erythema and friability was found in the entire                            examined stomach. Biopsies were taken with a cold                            forceps for histology.                           The exam was otherwise without abnormality. Complications:            No immediate complications. Estimated Blood Loss:     Estimated blood loss: none. Impression:               - Non-specific pan-gastritris. Biopsied to check                            for H. pylori.                           - The examination was otherwise normal. Recommendation:           - Patient has a contact number available for                            emergencies. The signs and symptoms of potential                            delayed complications were discussed with the                            patient. Return to normal activities tomorrow.  Written discharge instructions were provided to the                            patient.                           - Resume previous diet.                           - Continue present medications. It is safe to                            resume your blood thinner tomorrow.                           - Await pathology results. Milus Banister, MD 02/19/2021 11:09:02 AM This report has been signed electronically.

## 2021-02-19 NOTE — Progress Notes (Signed)
Report to PACU, RN, vss, BBS= Clear.  

## 2021-02-19 NOTE — Progress Notes (Signed)
HPI: This is a woman with abd pain, change in bowel habits, rectral bleeding.   ROS: complete GI ROS as described in HPI, all other review negative.  Constitutional:  No unintentional weight loss   Past Medical History:  Diagnosis Date   Allergy    seasonal   Anxiety    takes Lexapro daily   Arthritis    "back, from neck down pass my bra area" (03/25/2017)   Asthma    Bartholin gland cyst 08/29/2011   Bruises easily    pt is on Effient   Chronic back pain    herniated nucleus pulposus   Chronic back pain    "neck to bra area; lower back" (03/25/2017)   Chronic kidney disease    recurrent UTI's this year 2022   COPD (chronic obstructive pulmonary disease) (HCC)    early stages   Coronary artery disease    Depression    takes Klonopin daily   Diabetes mellitus without complication (HCC)    Diverticulosis    GERD (gastroesophageal reflux disease)    takes Nexium daily   H/O hiatal hernia    Heart attack ([place]) 2011   Hemorrhoids    Hernia    Hyperlipidemia    takes Lipitor daily   Hypertension    takes Losartan daily and Labetalol bid   Hypothyroidism    takes Synthroid daily   Insomnia    hydroxyzine prn   Joint pain    Pneumonia    "couple times" (03/25/2017)   Pre-diabetes    "just found out 1 wk ago" (03/25/2017)   Psoriasis    elbows,knees,back   Shortness of breath    with exertion   Slowing of urinary stream    Stress incontinence     Past Surgical History:  Procedure Laterality Date   [place]   "left one of my ovaries"   BACK SURGERY     BIOPSY  05/27/2019   Procedure: BIOPSY;  Surgeon: Daneil Dolin, MD;  Location: [facility];  Service: Endoscopy;;   COLONOSCOPY  2011   Dr. Ardis Hughs: Mild diverticulosis, descending diminutive colon polyp (not retrieved), next colonoscopy 10 years   CORONARY ANGIOPLASTY WITH STENT PLACEMENT  2011 X2   "regular stents didn't work; had to go back in in  ~ 1 month and put in medicated stents"   DILATION AND CURETTAGE OF UTERUS     ESOPHAGOGASTRODUODENOSCOPY     ESOPHAGOGASTRODUODENOSCOPY (EGD) WITH PROPOFOL N/A 05/27/2019   normal esophagus, dilation, erosive gastropathy s/p biopsy, normal duodenum. Negative H.pylori.    LEFT HEART CATH AND CORONARY ANGIOGRAPHY N/A 03/26/2017   Procedure: LEFT HEART CATH AND CORONARY ANGIOGRAPHY;  Surgeon: Belva Crome, MD;  Location: [facility];  Service: Cardiovascular;  Laterality: N/A;   LUMBAR LAMINECTOMY/DECOMPRESSION MICRODISCECTOMY  08/05/2011   Procedure: LUMBAR LAMINECTOMY/DECOMPRESSION MICRODISCECTOMY;  Surgeon: Otilio Connors, MD;  Location: [facility];  Service: Neurosurgery;  Laterality: Right;  Right Lumbar four-five extraforaminal discectomy   MALONEY DILATION N/A 05/27/2019   Procedure: Venia Minks DILATION;  Surgeon: Daneil Dolin, MD;  Location: [facility];  Service: Endoscopy;  Laterality: N/A;  54   TONSILLECTOMY     as a child   UPPER GASTROINTESTINAL ENDOSCOPY      Current Outpatient Medications  Medication Sig Dispense Refill   albuterol (VENTOLIN HFA) 108 (90 Base) MCG/ACT inhaler INHALE 2 PUFFS INTO THE LUNGS EVERY 6 (SIX) HOURS AS NEEDED  FOR WHEEZING OR SHORTNESS OF BREATH. 18 g 2   amLODipine (NORVASC) 5 MG tablet TAKE 1 TABLET (5 MG TOTAL) BY MOUTH DAILY. 90 tablet 3   amoxicillin-clavulanate (AUGMENTIN) 875-125 MG tablet Take 1 tablet by mouth every 12 (twelve) hours. 14 tablet 0   aspirin EC 81 MG tablet Take 81 mg by mouth daily.     atorvastatin (LIPITOR) 80 MG tablet Take 1 tablet (80 mg total) by mouth daily. 90 tablet 0   Blood Glucose Monitoring Suppl (TRUE METRIX METER) DEVI 1 kit by Does not apply route 4 (four) times daily. 1 Device 0   busPIRone (BUSPAR) 15 MG tablet TAKE 2 TABLETS IN THE MORNING, 1 TAB AT NOON AND 1 TAB IN THE EVENING (Patient taking differently: Take 3 at bedtime) 120 tablet 2   cetirizine (ZYRTEC) 10 MG tablet Take 10 mg by mouth as  needed.     clonazePAM (KLONOPIN) 0.5 MG tablet Take 1 tablet (0.5 mg total) by mouth 2 (two) times daily as needed for anxiety. 60 tablet 2   cyclobenzaprine (FLEXERIL) 10 MG tablet TAKE 1 TABLET (10 MG TOTAL) BY MOUTH DAILY AS NEEDED. 30 tablet 4   doxepin (SINEQUAN) 50 MG capsule TAKE 1 CAPSULE (50 MG TOTAL) BY MOUTH AT BEDTIME. 30 capsule 2   doxycycline (VIBRAMYCIN) 100 MG capsule Take 1 capsule (100 mg total) by mouth 2 (two) times daily. 14 capsule 0   escitalopram (LEXAPRO) 10 MG tablet TAKE 3 TABLETS (30 MG TOTAL) BY MOUTH DAILY. 90 tablet 2   Fluticasone-Umeclidin-Vilant (TRELEGY ELLIPTA) 100-62.5-25 MCG/INH AEPB Inhale 1 puff into the lungs daily. 60 each 4   furosemide (LASIX) 20 MG tablet Take 1 tablet (20 mg total) by mouth daily. 30 tablet 6   gabapentin (NEURONTIN) 600 MG tablet TAKE 1 TABLET BY MOUTH 4 TIMES DAILY 360 tablet 1   glucose blood (TRUE METRIX BLOOD GLUCOSE TEST) test strip Use as instructed 100 each 12   hydrocortisone (ANUSOL-HC) 2.5 % rectal cream PLACE 1 APPLICATION RECTALLY 2 (TWO) TIMES DAILY. (Patient not taking: Reported on 01/29/2021) 30 g 0   ipratropium-albuterol (DUONEB) 0.5-2.5 (3) MG/3ML SOLN TAKE 3 MLS VIA NEBULIZATION EVERY 6 HOURS (Patient taking differently: TAKE 3 MLS VIA NEBULIZATION EVERY 6 HOURS as needed) 360 mL 1   levothyroxine (SYNTHROID) 100 MCG tablet TAKE 1 TABLET (100 MCG TOTAL) BY MOUTH DAILY. 30 tablet 6   levothyroxine (SYNTHROID) 25 MCG tablet Take 1 tablet (25 mcg total) by mouth daily. In addition to 151mg tablet daily (Patient not taking: Reported on 01/29/2021) 90 tablet 3   losartan (COZAAR) 50 MG tablet TAKE 1 & 1/2 TABLETS BY MOUTH DAILY. 45 tablet 6   metFORMIN (GLUCOPHAGE) 500 MG tablet TAKE 1 TABLET (500 MG TOTAL) BY MOUTH 2 (TWO) TIMES DAILY WITH A MEAL. 60 tablet 3   mirtazapine (REMERON) 30 MG tablet TAKE 1 TABLET (30 MG TOTAL) BY MOUTH AT BEDTIME. 30 tablet 2   montelukast (SINGULAIR) 10 MG tablet TAKE 1 TABLET BY MOUTH AT  BEDTIME. 30 tablet 2   nebivolol (BYSTOLIC) 10 MG tablet TAKE 1 TABLET (10 MG TOTAL) BY MOUTH DAILY. 90 tablet 4   nitroGLYCERIN (NITROSTAT) 0.4 MG SL tablet Place 1 tablet (0.4 mg total) under the tongue every 5 (five) minutes as needed for chest pain. 90 tablet 3   omeprazole (PRILOSEC) 40 MG capsule TAKE 1 CAPSULE (40 MG TOTAL) BY MOUTH 2 (TWO) TIMES DAILY BEFORE A MEAL. 120 capsule 0   predniSONE (DELTASONE)  10 MG tablet Take 4 tablets (40 mg total) by mouth daily. 16 tablet 0   ticagrelor (BRILINTA) 60 MG TABS tablet TAKE 1 TABLET (60 MG TOTAL) BY MOUTH 2 (TWO) TIMES DAILY. 60 tablet 5   triamcinolone cream (KENALOG) 0.1 % APPLY 1 APPLICATION TOPICALLY 2 TIMES DAILY. 30 g 1   TRUEPLUS LANCETS 28G MISC 28 g by Does not apply route QID. 120 each 2   Vitamin E 180 MG CAPS Take 180 mg by mouth daily.     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   Latex Shortness Of Breath and Rash 12/22/2008   Codeine Nausea Only 12/22/2008    Family History  Problem Relation Age of Onset   Heart disease Mother    Hypertension Mother    Stroke Mother    Mental illness Mother    Heart disease Father    Hypertension Father    Diabetes Father    Colon polyps Brother    Breast cancer Maternal Aunt    Throat cancer Maternal Uncle    Thyroid disease Paternal Aunt    Heart disease Maternal Grandfather    Cancer Paternal Grandmother        mouth   Anesthesia problems Daughter    Hypotension Neg Hx    Malignant hyperthermia Neg Hx    Pseudochol deficiency Neg Hx    Colon cancer Neg Hx    Stomach cancer Neg Hx    Esophageal cancer Neg Hx    Pancreatic cancer Neg Hx    Rectal cancer Neg Hx     Social History   Socioeconomic History    Marital status: Divorced    Spouse name: Not on file   Number of children: Not on file   Years of education: Not on file   Highest education level: Not on file  Occupational History   Not on file  Tobacco Use   Smoking status: Every Day    Packs/day: 1.00    Years: 50.00    Pack years: 50.00    Types: Cigarettes   Smokeless tobacco: Never  Vaping Use   Vaping Use: Never used  Substance and Sexual Activity   Alcohol use: Yes    Comment: once a week   Drug use: No    Types: Cocaine    Comment: has been years per pt   Sexual activity: Not Currently    Birth control/protection: Surgical  Other Topics Concern   Not on file  Social History Narrative   Not on file   Social Determinants of Health   Financial Resource Strain: Not on file  Food Insecurity: Not on file  Transportation Needs: Not on file  Physical Activity: Not on file  Stress: Not on file  Social Connections: Not on file  Intimate Partner Violence: Not on file     Physical Exam: BP 101/61   Pulse 69   Temp 98.9 F (37.2 C) (Temporal)   Ht _0  (1.549 m)   Wt 135 lb (61.2 kg)   SpO2 93%   BMI 25.51 kg/m  Constitutional: generally well-appearing Psychiatric: alert and oriented x3 Abdomen: soft, nontender, nondistended, no obvious ascites, no peritoneal signs, normal bowel sounds No peripheral edema noted in  lower extremities  Assessment and plan: 65 y.o. female with change in bowel habits, rectal bleeidng, abd pains  For colonoscoyp and egd today  Please see the "Patient Instructions" section for addition details about the plan.  Owens Loffler, MD [facility] 02/19/2021, 10:27 AM

## 2021-02-19 NOTE — Patient Instructions (Signed)
Thank you for letting us take care of your healthcare needs today. Please see handouts on Polyps, Diverticulosis  and Gastritis.  You may restart Brilinta in the morning.   YOU HAD AN ENDOSCOPIC PROCEDURE TODAY AT [facility]:   Refer to the procedure report that was given to you for any specific questions about what was found during the examination.  If the procedure report does not answer your questions, please call your gastroenterologist to clarify.  If you requested that your care partner not be given the details of your procedure findings, then the procedure report has been included in a sealed envelope for you to review at your convenience later.  YOU SHOULD EXPECT: Some feelings of bloating in the abdomen. Passage of more gas than usual.  Walking can help get rid of the air that was put into your GI tract during the procedure and reduce the bloating. If you had a lower endoscopy (such as a colonoscopy or flexible sigmoidoscopy) you may notice spotting of blood in your stool or on the toilet paper. If you underwent a bowel prep for your procedure, you may not have a normal bowel movement for a few days.  Please Note:  You might notice some irritation and congestion in your nose or some drainage.  This is from the oxygen used during your procedure.  There is no need for concern and it should clear up in a day or so.  SYMPTOMS TO REPORT IMMEDIATELY:  Following lower endoscopy (colonoscopy or flexible sigmoidoscopy):  Excessive amounts of blood in the stool  Significant tenderness or worsening of abdominal pains  Swelling of the abdomen that is new, acute  Fever of 100F or higher  Following upper endoscopy (EGD)  Vomiting of blood or coffee ground material  New chest pain or pain under the shoulder blades  Painful or persistently difficult swallowing  New shortness of breath  Fever of 100F or higher  Black, tarry-looking stools  For urgent or emergent issues, a  gastroenterologist can be reached at any hour by calling [phone]. Do not use MyChart messaging for urgent concerns.    DIET:  We do recommend a small meal at first, but then you may proceed to your regular diet.  Drink plenty of fluids but you should avoid alcoholic beverages for 24 hours.  ACTIVITY:  You should plan to take it easy for the rest of today and you should NOT DRIVE or use heavy machinery until tomorrow (because of the sedation medicines used during the test).    FOLLOW UP: Our staff will call the number listed on your records 48-72 hours following your procedure to check on you and address any questions or concerns that you may have regarding the information given to you following your procedure. If we do not reach you, we will leave a message.  We will attempt to reach you two times.  During this call, we will ask if you have developed any symptoms of COVID 19. If you develop any symptoms (ie: fever, flu-like symptoms, shortness of breath, cough etc.) before then, please call [phone].  If you test positive for Covid 19 in the 2 weeks post procedure, please call and report this information to [place].    If any biopsies were taken you will be contacted by phone or by letter within the next 1-3 weeks.  Please call us at [phone] if you have not heard about the biopsies in 3 weeks.    SIGNATURES/CONFIDENTIALITY: You  and/or your care partner have signed paperwork which will be entered into your electronic medical record.  These signatures attest to the fact that that the information above on your After Visit Summary has been reviewed and is understood.  Full responsibility of the confidentiality of this discharge information lies with you and/or your care-partner.

## 2021-02-19 NOTE — Progress Notes (Signed)
Called to room to assist during endoscopic procedure.  Patient ID and intended procedure confirmed with present staff. Received instructions for my participation in the procedure from the performing physician.  

## 2021-02-19 NOTE — Op Note (Signed)
[facility] Patient Name: [name] [name] Procedure Date: 02/19/2021 10:38 AM MRN: [id number] Endoscopist: Milus Banister , MD Age: 65 Referring MD:  Date of Birth: 1956-06-19 Gender: Female Account #: [id number] Procedure:                Colonoscopy Indications:              Generalized abdominal pain, alternating                            constipation/diarrhea, minor rectal bleeding Medicines:                Monitored Anesthesia Care Procedure:                Pre-Anesthesia Assessment:                           - Prior to the procedure, a History and Physical                            was performed, and patient medications and                            allergies were reviewed. The patient's tolerance of                            previous anesthesia was also reviewed. The risks                            and benefits of the procedure and the sedation                            options and risks were discussed with the patient.                            All questions were answered, and informed consent                            was obtained. Prior Anticoagulants: The patient has                            taken brilinta, last dose was 5 days prior to                            procedure. ASA Grade Assessment: III - A patient                            with severe systemic disease. After reviewing the                            risks and benefits, the patient was deemed in                            satisfactory condition to undergo the procedure.  After obtaining informed consent, the colonoscope                            was passed under direct vision. Throughout the                            procedure, the patient's blood pressure, pulse, and                            oxygen saturations were monitored continuously. The                            CF [id number] was introduced through the anus                            and advanced to the  the terminal ileum. The                            colonoscopy was performed without difficulty. The                            patient tolerated the procedure well. The quality                            of the bowel preparation was good. The terminal                            ileum, ileocecal valve, appendiceal orifice, and                            rectum were photographed. Scope In: 10:45:55 AM Scope Out: 10:55:24 AM Scope Withdrawal Time: 0 hours 6 minutes 33 seconds  Total Procedure Duration: 0 hours 9 minutes 29 seconds  Findings:                 Terminal ileum was normal.                           Three sessile polyps were found in the sigmoid                            colon and transverse colon. The polyps were 3 to 7                            mm in size. These polyps were removed with a cold                            snare. Resection and retrieval were complete.                           Multiple small and large-mouthed diverticula were                            found in the left colon.  The exam was otherwise without abnormality on                            direct and retroflexion views. Complications:            No immediate complications. Estimated blood loss:                            None. Estimated Blood Loss:     Estimated blood loss: none. Impression:               - Three 3 to 7 mm polyps in the sigmoid colon and                            in the transverse colon, removed with a cold snare.                            Complete resection and retrieval.                           - Diverticulosis in the left colon.                           - Normal terminal ileum.                           - The examination was otherwise normal on direct                            and retroflexion views. Recommendation:           - Await pathology results. Milus Banister, MD 02/19/2021 11:06:45 AM This report has been signed electronically.

## 2021-02-21 ENCOUNTER — Other Ambulatory Visit: Payer: Self-pay

## 2021-02-21 ENCOUNTER — Telehealth: Payer: Self-pay

## 2021-02-21 NOTE — Telephone Encounter (Signed)
  Follow up Call-  Call back number 02/19/2021  Post procedure Call Back phone  # [phone]  Permission to leave phone message Yes  Some recent data might be hidden     Patient questions:  Do you have a fever, pain , or abdominal swelling? No. Pain Score  0 *  Have you tolerated food without any problems? Yes.    Have you been able to return to your normal activities? Yes.    Do you have any questions about your discharge instructions: Diet   No. Medications  No. Follow up visit  No.  Do you have questions or concerns about your Care? No.  Actions: * If pain score is 4 or above: No action needed, pain <4.  Have you developed a fever since your procedure? no  2.   Have you had an respiratory symptoms (SOB or cough) since your procedure? no  3.   Have you tested positive for COVID 19 since your procedure no  4.   Have you had any family members/close contacts diagnosed with the COVID 19 since your procedure?  no   If yes to any of these questions please route to Joylene John, RN and Joella Prince, RN

## 2021-02-25 NOTE — Progress Notes (Signed)
Established Patient Office Visit  Subjective:  Patient ID: [name] [name], female    DOB: 1956/05/10  Age: 65 y.o. MRN: [id number]  CC:  Chief Complaint  Patient presents with   Follow-up    HPI [name] [name] presents for abdominal pain s/p EGD and Colonoscopy 02/19/21 Patient began having a COPD flare the last week in July and was seen in urgent care July 31.  Chest x-ray was negative at that visit.  Patient given pulsed prednisone and Augmentin.  She still has persisting cough has been paroxysmal in nature.  The cough is nocturnal and during the day.  She began having severe neck and shoulder pain with the cough and associated loose stools but no blood in the stool no emesis but she has had some nausea.  Patient has had 2 different COVID test both of which negative.  Patient notes increased wheezing.  She does have history of cervical spine disease and is concerned about damage to the spine itself.  On arrival blood pressure was 132/82.  She is compliant with the amlodipine.  Patient does need refills on her Trelegy inhaler.  Patient states the Flexeril does not help the back pain.  Patient has also had abdominal pain and work-up for this with gastroenterology shows gastropathy which is benign in the stomach and multiple colon polyps which are benign  Patient does take omeprazole 40 mg twice daily  Patient cannot tolerate codeine products.    Past Medical History:  Diagnosis Date   Allergy    seasonal   Anxiety    takes Lexapro daily   Arthritis    "back, from neck down pass my bra area" (03/25/2017)   Asthma    Bartholin gland cyst 08/29/2011   Bruises easily    pt is on Effient   Chronic back pain    herniated nucleus pulposus   Chronic back pain    "neck to bra area; lower back" (03/25/2017)   Chronic kidney disease    recurrent UTI's this year 2022   COPD (chronic obstructive pulmonary disease) (HCC)    early stages   Coronary artery disease    Depression    takes  Klonopin daily   Diabetes mellitus without complication (HCC)    Diverticulosis    GERD (gastroesophageal reflux disease)    takes Nexium daily   H/O hiatal hernia    Heart attack ([place]) 2011   Hemorrhoids    Hernia    Hyperlipidemia    takes Lipitor daily   Hypertension    takes Losartan daily and Labetalol bid   Hypothyroidism    takes Synthroid daily   Insomnia    hydroxyzine prn   Joint pain    Pneumonia    "couple times" (03/25/2017)   Pre-diabetes    "just found out 1 wk ago" (03/25/2017)   Psoriasis    elbows,knees,back   Shortness of breath    with exertion   Slowing of urinary stream    Stress incontinence     Past Surgical History:  Procedure Laterality Date   [place]   "left one of my ovaries"   BACK SURGERY     BIOPSY  05/27/2019   Procedure: BIOPSY;  Surgeon: Daneil Dolin, MD;  Location: [facility];  Service: Endoscopy;;   COLONOSCOPY  2011   Dr. Ardis Hughs: Mild diverticulosis, descending diminutive colon polyp (not retrieved), next colonoscopy 10 years   CORONARY ANGIOPLASTY  WITH STENT PLACEMENT  2011 X2   "regular stents didn't work; had to go back in in ~ 1 month and put in medicated stents"   [place]     ESOPHAGOGASTRODUODENOSCOPY     ESOPHAGOGASTRODUODENOSCOPY (EGD) WITH PROPOFOL N/A 05/27/2019   normal esophagus, dilation, erosive gastropathy s/p biopsy, normal duodenum. Negative H.pylori.    LEFT HEART CATH AND CORONARY ANGIOGRAPHY N/A 03/26/2017   Procedure: LEFT HEART CATH AND CORONARY ANGIOGRAPHY;  Surgeon: Belva Crome, MD;  Location: [facility];  Service: Cardiovascular;  Laterality: N/A;   LUMBAR LAMINECTOMY/DECOMPRESSION MICRODISCECTOMY  08/05/2011   Procedure: LUMBAR LAMINECTOMY/DECOMPRESSION MICRODISCECTOMY;  Surgeon: Otilio Connors, MD;  Location: [facility];  Service: Neurosurgery;  Laterality: Right;  Right Lumbar four-five extraforaminal  discectomy   MALONEY DILATION N/A 05/27/2019   Procedure: Venia Minks DILATION;  Surgeon: Daneil Dolin, MD;  Location: [facility];  Service: Endoscopy;  Laterality: N/A;  56   TONSILLECTOMY     as a child   UPPER GASTROINTESTINAL ENDOSCOPY      Family History  Problem Relation Age of Onset   Heart disease Mother    Hypertension Mother    Stroke Mother    Mental illness Mother    Heart disease Father    Hypertension Father    Diabetes Father    Colon polyps Brother    Breast cancer Maternal Aunt    Throat cancer Maternal Uncle    Thyroid disease Paternal Aunt    Heart disease Maternal Grandfather    Cancer Paternal Grandmother        [place]   Anesthesia problems Daughter    Hypotension Neg Hx    Malignant hyperthermia Neg Hx    Pseudochol deficiency Neg Hx    Colon cancer Neg Hx    Stomach cancer Neg Hx    Esophageal cancer Neg Hx    Pancreatic cancer Neg Hx    Rectal cancer Neg Hx     Social History   Socioeconomic History   Marital status: Divorced    Spouse name: Not on file   Number of children: Not on file   Years of education: Not on file   Highest education level: Not on file  Occupational History   Not on file  Tobacco Use   Smoking status: Every Day    Packs/day: 1.00    Years: 50.00    Pack years: 50.00    Types: Cigarettes   Smokeless tobacco: Never  Vaping Use   Vaping Use: Never used  Substance and Sexual Activity   Alcohol use: Yes    Comment: once a week   Drug use: No    Types: Cocaine    Comment: has been years per pt   Sexual activity: Not Currently    Birth control/protection: Surgical  Other Topics Concern   Not on file  Social History Narrative   Not on file   Social Determinants of Health   Financial Resource Strain: Not on file  Food Insecurity: Not on file  Transportation Needs: Not on file  Physical Activity: Not on file  Stress: Not on file  Social Connections: Not on file  Intimate Partner Violence: Not on file     Outpatient Medications Prior to Visit  Medication Sig Dispense Refill   albuterol (VENTOLIN HFA) 108 (90 Base) MCG/ACT inhaler INHALE 2 PUFFS INTO THE LUNGS EVERY 6 (SIX) HOURS AS NEEDED FOR WHEEZING OR SHORTNESS OF BREATH. 18 g 2  amLODipine (NORVASC) 5 MG tablet TAKE 1 TABLET (5 MG TOTAL) BY MOUTH DAILY. 90 tablet 3   aspirin EC 81 MG tablet Take 81 mg by mouth daily.     atorvastatin (LIPITOR) 80 MG tablet Take 1 tablet (80 mg total) by mouth daily. 90 tablet 0   busPIRone (BUSPAR) 15 MG tablet TAKE 2 TABLETS IN THE MORNING, 1 TAB AT NOON AND 1 TAB IN THE EVENING (Patient taking differently: Take 3 at bedtime) 120 tablet 2   cetirizine (ZYRTEC) 10 MG tablet Take 10 mg by mouth as needed.     clonazePAM (KLONOPIN) 0.5 MG tablet Take 1 tablet (0.5 mg total) by mouth 2 (two) times daily as needed for anxiety. 60 tablet 2   doxepin (SINEQUAN) 50 MG capsule TAKE 1 CAPSULE (50 MG TOTAL) BY MOUTH AT BEDTIME. 30 capsule 2   escitalopram (LEXAPRO) 10 MG tablet TAKE 3 TABLETS (30 MG TOTAL) BY MOUTH DAILY. 90 tablet 2   furosemide (LASIX) 20 MG tablet Take 1 tablet (20 mg total) by mouth daily. 30 tablet 6   gabapentin (NEURONTIN) 600 MG tablet TAKE 1 TABLET BY MOUTH 4 TIMES DAILY 360 tablet 1   glucose blood (TRUE METRIX BLOOD GLUCOSE TEST) test strip Use as instructed 100 each 12   ipratropium-albuterol (DUONEB) 0.5-2.5 (3) MG/3ML SOLN TAKE 3 MLS VIA NEBULIZATION EVERY 6 HOURS (Patient taking differently: TAKE 3 MLS VIA NEBULIZATION EVERY 6 HOURS as needed) 360 mL 1   levothyroxine (SYNTHROID) 100 MCG tablet TAKE 1 TABLET (100 MCG TOTAL) BY MOUTH DAILY. 30 tablet 6   levothyroxine (SYNTHROID) 25 MCG tablet Take 1 tablet (25 mcg total) by mouth daily. In addition to 135mg tablet daily 90 tablet 3   losartan (COZAAR) 50 MG tablet TAKE 1 & 1/2 TABLETS BY MOUTH DAILY. 45 tablet 6   metFORMIN (GLUCOPHAGE) 500 MG tablet TAKE 1 TABLET (500 MG TOTAL) BY MOUTH 2 (TWO) TIMES DAILY WITH A MEAL. 60 tablet 3    mirtazapine (REMERON) 30 MG tablet TAKE 1 TABLET (30 MG TOTAL) BY MOUTH AT BEDTIME. 30 tablet 2   montelukast (SINGULAIR) 10 MG tablet TAKE 1 TABLET BY MOUTH AT BEDTIME. 30 tablet 2   nebivolol (BYSTOLIC) 10 MG tablet TAKE 1 TABLET (10 MG TOTAL) BY MOUTH DAILY. 90 tablet 4   omeprazole (PRILOSEC) 40 MG capsule TAKE 1 CAPSULE (40 MG TOTAL) BY MOUTH 2 (TWO) TIMES DAILY BEFORE A MEAL. 120 capsule 0   ticagrelor (BRILINTA) 60 MG TABS tablet TAKE 1 TABLET (60 MG TOTAL) BY MOUTH 2 (TWO) TIMES DAILY. 60 tablet 5   Vitamin E 180 MG CAPS Take 180 mg by mouth daily.     cyclobenzaprine (FLEXERIL) 10 MG tablet TAKE 1 TABLET (10 MG TOTAL) BY MOUTH DAILY AS NEEDED. 30 tablet 4   Blood Glucose Monitoring Suppl (TRUE METRIX METER) DEVI 1 kit by Does not apply route 4 (four) times daily. (Patient not taking: No sig reported) 1 Device 0   nitroGLYCERIN (NITROSTAT) 0.4 MG SL tablet Place 1 tablet (0.4 mg total) under the tongue every 5 (five) minutes as needed for chest pain. 90 tablet 3   triamcinolone cream (KENALOG) 0.1 % APPLY 1 APPLICATION TOPICALLY 2 TIMES DAILY. (Patient not taking: No sig reported) 30 g 1   TRUEPLUS LANCETS 28G MISC 28 g by Does not apply route QID. (Patient not taking: No sig reported) 120 each 2   amoxicillin-clavulanate (AUGMENTIN) 875-125 MG tablet Take 1 tablet by mouth every 12 (twelve) hours. (  Patient not taking: Reported on 02/26/2021) 14 tablet 0   doxycycline (VIBRAMYCIN) 100 MG capsule Take 1 capsule (100 mg total) by mouth 2 (two) times daily. (Patient not taking: Reported on 02/19/2021) 14 capsule 0   Fluticasone-Umeclidin-Vilant (TRELEGY ELLIPTA) 100-62.5-25 MCG/INH AEPB Inhale 1 puff into the lungs daily. (Patient not taking: Reported on 02/26/2021) 60 each 4   hydrocortisone (ANUSOL-HC) 2.5 % rectal cream PLACE 1 APPLICATION RECTALLY 2 (TWO) TIMES DAILY. (Patient not taking: No sig reported) 30 g 0   predniSONE (DELTASONE) 10 MG tablet Take 4 tablets (40 mg total) by mouth daily.  (Patient not taking: Reported on 02/19/2021) 16 tablet 0   No facility-administered medications prior to visit.    Allergies  Allergen Reactions   Avocado Anaphylaxis   Latex Shortness Of Breath and Rash   Codeine Nausea Only    ROS Review of Systems  HENT: Negative.    Respiratory:  Positive for cough, chest tightness, shortness of breath and wheezing.   Cardiovascular:  Positive for chest pain.  Gastrointestinal:  Positive for abdominal pain, diarrhea and nausea. Negative for abdominal distention, anal bleeding, blood in stool, constipation, rectal pain and vomiting.  Endocrine: Negative.   Genitourinary: Negative.   Musculoskeletal:  Positive for neck pain.  Skin:  Negative for rash.  Psychiatric/Behavioral: Negative.       Objective:    Physical Exam Constitutional:      Appearance: Normal appearance. She is normal weight.  HENT:     Head: Normocephalic and atraumatic.     Nose: Nose normal.     Mouth/Throat:     Mouth: Mucous membranes are moist.     Pharynx: Oropharynx is clear.  Eyes:     Extraocular Movements: Extraocular movements intact.     Conjunctiva/sclera: Conjunctivae normal.     Pupils: Pupils are equal, round, and reactive to light.  Cardiovascular:     Rate and Rhythm: Normal rate and regular rhythm.     Pulses: Normal pulses.     Heart sounds: Normal heart sounds.  Pulmonary:     Effort: Pulmonary effort is normal. No respiratory distress.     Breath sounds: No stridor. Wheezing present. No rhonchi or rales.  Chest:     Chest wall: No tenderness.  Abdominal:     General: Bowel sounds are normal.     Palpations: Abdomen is soft.  Musculoskeletal:        General: Normal range of motion.     Cervical back: Normal range of motion and neck supple.  Skin:    General: Skin is warm and dry.  Neurological:     General: No focal deficit present.     Mental Status: She is alert and oriented to person, place, and time. Mental status is at baseline.   Psychiatric:        Thought Content: Thought content normal.        Judgment: Judgment normal.     Comments: tearful    BP 132/82 (BP Location: Left Arm, Patient Position: Sitting, Cuff Size: Normal)   Pulse 64   Temp 98.3 F (36.8 C) (Oral)   Ht 5' 1" (1.549 m)   Wt 132 lb 6.4 oz (60.1 kg)   SpO2 95%   BMI 25.02 kg/m  Wt Readings from Last 3 Encounters:  02/26/21 132 lb 6.4 oz (60.1 kg)  02/19/21 135 lb (61.2 kg)  02/11/21 135 lb (61.2 kg)     Health Maintenance Due  Topic Date Due  FOOT EXAM  Never done   OPHTHALMOLOGY EXAM  Never done   Hepatitis C Screening  Never done   Zoster Vaccines- Shingrix (1 of 2) Never done   MAMMOGRAM  06/01/2011   Pneumococcal Vaccine 72-17 Years old (2 - PCV) 03/27/2018   COVID-19 Vaccine (3 - Booster for Pfizer series) 03/15/2020   INFLUENZA VACCINE  02/12/2021    There are no preventive care reminders to display for this patient.  Lab Results  Component Value Date   TSH 6.900 (H) 11/24/2020   Lab Results  Component Value Date   WBC 22.6 (H) 02/11/2021   HGB 15.2 (H) 02/11/2021   HCT 44.9 02/11/2021   MCV 97.8 02/11/2021   PLT 437 (H) 02/11/2021   Lab Results  Component Value Date   NA 139 02/11/2021   K 3.9 02/11/2021   CO2 24 02/11/2021   GLUCOSE 185 (H) 02/11/2021   BUN 9 02/11/2021   CREATININE 0.81 02/11/2021   BILITOT 0.9 02/11/2021   ALKPHOS 97 02/11/2021   AST 21 02/11/2021   ALT 14 02/11/2021   PROT 7.4 02/11/2021   ALBUMIN 4.1 02/11/2021   CALCIUM 9.5 02/11/2021   ANIONGAP 11 02/11/2021   EGFR 82 11/24/2020   GFR 70.52 08/30/2020   Lab Results  Component Value Date   CHOL 147 11/24/2020   Lab Results  Component Value Date   HDL 58 11/24/2020   Lab Results  Component Value Date   LDLCALC 69 11/24/2020   Lab Results  Component Value Date   TRIG 109 11/24/2020   Lab Results  Component Value Date   CHOLHDL 2.5 11/24/2020   Lab Results  Component Value Date   HGBA1C 6.0 (H) 11/24/2020       Assessment & Plan:   Problem List Items Addressed This Visit       Respiratory   COPD exacerbation (HCC) - Primary (Chronic)    Recurrent COPD exacerbation  Plan pulse prednisone 40 mg daily for 5 days Plan doxycycline 100 mg twice daily for 7 days Cough suppression with promethazine dextromethorphan as the patient cannot take codeine Solu-Medrol 125 mg IM was given this visit        Relevant Medications   promethazine-dextromethorphan (PROMETHAZINE-DM) 6.25-15 MG/5ML syrup   predniSONE (DELTASONE) 10 MG tablet   Fluticasone-Umeclidin-Vilant (TRELEGY ELLIPTA) 100-62.5-25 MCG/INH AEPB     Other   Abdominal pain, epigastric    Recurrent epigastric abdominal pain with recent upper endoscopy showing gastropathy and colon polyps  Continue omeprazole twice daily and add Carafate 4 times daily 1 g      Chronic neck pain    Acute neck spasm Plan methocarbamol 500 mg every 6 hours as needed and discontinue his Flexeril Plan tramadol 50 mg every 8 hours dispense 15 tablets only PDMP database reviewed no recent prescriptions for opiates this will be short-term only  May yet need imaging      Relevant Medications   predniSONE (DELTASONE) 10 MG tablet   traMADol (ULTRAM) 50 MG tablet   methocarbamol (ROBAXIN) 500 MG tablet    Meds ordered this encounter  Medications   sucralfate (CARAFATE) 1 g tablet    Sig: Take 1 tablet (1 g total) by mouth 4 (four) times daily -  with meals and at bedtime.    Dispense:  120 tablet    Refill:  0   promethazine-dextromethorphan (PROMETHAZINE-DM) 6.25-15 MG/5ML syrup    Sig: Take 5 mLs by mouth 4 (four) times daily as needed for cough.  Dispense:  180 mL    Refill:  0    Allergic to codeine   predniSONE (DELTASONE) 10 MG tablet    Sig: Take 4 tablets (40 mg total) by mouth daily.    Dispense:  16 tablet    Refill:  0   traMADol (ULTRAM) 50 MG tablet    Sig: Take 1 tablet (50 mg total) by mouth every 8 (eight) hours as needed  for up to 5 days.    Dispense:  20 tablet    Refill:  0   Fluticasone-Umeclidin-Vilant (TRELEGY ELLIPTA) 100-62.5-25 MCG/INH AEPB    Sig: Inhale 1 puff into the lungs daily.    Dispense:  60 each    Refill:  4    Needs patient assistance  PASS   Stopping Breo and Incruse   doxycycline (VIBRA-TABS) 100 MG tablet    Sig: Take 1 tablet (100 mg total) by mouth 2 (two) times daily.    Dispense:  14 tablet    Refill:  0   DISCONTD: methylPREDNISolone sodium succinate (SOLU-MEDROL) 125 mg/2 mL injection 80 mg   methylPREDNISolone sodium succinate (SOLU-MEDROL) 125 mg/2 mL injection 125 mg   methocarbamol (ROBAXIN) 500 MG tablet    Sig: Take 1 tablet (500 mg total) by mouth every 6 (six) hours as needed for muscle spasms.    Dispense:  60 tablet    Refill:  1    Follow-up: Return in about 10 days (around 03/08/2021) for johnson.    Asencion Noble, MD

## 2021-02-26 ENCOUNTER — Ambulatory Visit: Payer: Self-pay | Attending: Critical Care Medicine | Admitting: Critical Care Medicine

## 2021-02-26 ENCOUNTER — Other Ambulatory Visit: Payer: Self-pay

## 2021-02-26 ENCOUNTER — Encounter: Payer: Self-pay | Admitting: Critical Care Medicine

## 2021-02-26 VITALS — BP 132/82 | HR 64 | Temp 98.3°F | Ht 61.0 in | Wt 132.4 lb

## 2021-02-26 DIAGNOSIS — G8929 Other chronic pain: Secondary | ICD-10-CM | POA: Insufficient documentation

## 2021-02-26 DIAGNOSIS — R1013 Epigastric pain: Secondary | ICD-10-CM

## 2021-02-26 DIAGNOSIS — J441 Chronic obstructive pulmonary disease with (acute) exacerbation: Secondary | ICD-10-CM

## 2021-02-26 DIAGNOSIS — M542 Cervicalgia: Secondary | ICD-10-CM

## 2021-02-26 MED ORDER — METHYLPREDNISOLONE SODIUM SUCC 125 MG IJ SOLR
125.0000 mg | Freq: Once | INTRAMUSCULAR | Status: AC
  Administered 2021-02-26: 125 mg via INTRAMUSCULAR

## 2021-02-26 MED ORDER — DOXYCYCLINE HYCLATE 100 MG PO TABS
100.0000 mg | ORAL_TABLET | Freq: Two times a day (BID) | ORAL | 0 refills | Status: DC
  Filled 2021-02-26: qty 14, 7d supply, fill #0

## 2021-02-26 MED ORDER — SUCRALFATE 1 G PO TABS
1.0000 g | ORAL_TABLET | Freq: Three times a day (TID) | ORAL | 0 refills | Status: DC
  Filled 2021-02-26: qty 120, 30d supply, fill #0

## 2021-02-26 MED ORDER — METHOCARBAMOL 500 MG PO TABS
500.0000 mg | ORAL_TABLET | Freq: Four times a day (QID) | ORAL | 1 refills | Status: DC | PRN
  Filled 2021-02-26: qty 60, 15d supply, fill #0
  Filled 2021-03-23: qty 60, 15d supply, fill #1

## 2021-02-26 MED ORDER — PREDNISONE 10 MG PO TABS
40.0000 mg | ORAL_TABLET | Freq: Every day | ORAL | 0 refills | Status: DC
  Filled 2021-02-26: qty 16, 4d supply, fill #0

## 2021-02-26 MED ORDER — TRAMADOL HCL 50 MG PO TABS
50.0000 mg | ORAL_TABLET | Freq: Three times a day (TID) | ORAL | 0 refills | Status: AC | PRN
  Filled 2021-02-26: qty 20, 7d supply, fill #0

## 2021-02-26 MED ORDER — PROMETHAZINE-DM 6.25-15 MG/5ML PO SYRP
5.0000 mL | ORAL_SOLUTION | Freq: Four times a day (QID) | ORAL | 0 refills | Status: DC | PRN
  Filled 2021-02-26: qty 180, 9d supply, fill #0

## 2021-02-26 MED ORDER — TRELEGY ELLIPTA 100-62.5-25 MCG/INH IN AEPB
1.0000 | INHALATION_SPRAY | Freq: Every day | RESPIRATORY_TRACT | 4 refills | Status: DC
  Filled 2021-03-20: qty 120, 60d supply, fill #0

## 2021-02-26 MED ORDER — METHYLPREDNISOLONE SODIUM SUCC 125 MG IJ SOLR: 80.0000 mg | Freq: Once | INTRAMUSCULAR | Status: DC

## 2021-02-26 NOTE — Assessment & Plan Note (Signed)
Recurrent epigastric abdominal pain with recent upper endoscopy showing gastropathy and colon polyps  Continue omeprazole twice daily and add Carafate 4 times daily 1 g

## 2021-02-26 NOTE — Assessment & Plan Note (Signed)
Acute neck spasm Plan methocarbamol 500 mg every 6 hours as needed and discontinue his Flexeril Plan tramadol 50 mg every 8 hours dispense 15 tablets only PDMP database reviewed no recent prescriptions for opiates this will be short-term only  May yet need imaging

## 2021-02-26 NOTE — Assessment & Plan Note (Signed)
Recurrent COPD exacerbation  Plan pulse prednisone 40 mg daily for 5 days Plan doxycycline 100 mg twice daily for 7 days Cough suppression with promethazine dextromethorphan as the patient cannot take codeine Solu-Medrol 125 mg IM was given this visit

## 2021-02-26 NOTE — Patient Instructions (Addendum)
A Solu-Medrol injection was given at this visit for neck inflammation and lung inflammation  Begin prednisone again 40 mg a day for 5 days then discontinue  Begin doxycycline 1 twice daily for lung inflammation  Begin Carafate 1 tablet 4 times daily for your stomach  Stay on omeprazole twice daily as you are already taking refill sent to our pharmacy  Continue Trelegy 1 puff daily refill was sent to our pharmacy under the patient assistance  Tramadol sent for pain management to the pharmacy take every 8 hours as needed for neck pain  Discontinue Flexeril and begin methocarbamol 1 tablet 4 times daily as needed for neck spasm and pain  Take promethazine dextromethorphan 1 tablespoon 4 times daily as needed to suppress cough  Return to see your primary care provider Dr. Wynetta Emery in the next 10 to 14 days for recheck  We ultimately need to image your neck with an MRI but you are not well enough to lay flat to have an MRI done at this time to image the neck

## 2021-02-26 NOTE — Assessment & Plan Note (Signed)
" >>  ASSESSMENT AND PLAN FOR COPD EXACERBATION (HCC) WRITTEN ON 02/26/2021  2:58 PM BY Zenita Kister E, MD  Recurrent COPD exacerbation  Plan pulse prednisone  40 mg daily for 5 days Plan doxycycline  100 mg twice daily for 7 days Cough suppression with promethazine  dextromethorphan  as the patient cannot take codeine  Solu-Medrol  125 mg IM was given this visit   "

## 2021-02-27 ENCOUNTER — Encounter: Payer: Self-pay | Admitting: Gastroenterology

## 2021-02-27 ENCOUNTER — Other Ambulatory Visit: Payer: Self-pay

## 2021-03-01 ENCOUNTER — Telehealth: Payer: Self-pay | Admitting: Internal Medicine

## 2021-03-01 DIAGNOSIS — G8929 Other chronic pain: Secondary | ICD-10-CM

## 2021-03-01 DIAGNOSIS — M542 Cervicalgia: Secondary | ICD-10-CM

## 2021-03-01 NOTE — Telephone Encounter (Signed)
Will forward to provider  

## 2021-03-01 NOTE — Telephone Encounter (Signed)
Pt stated she was advised by Dr. Joya Gaskins to get an MRI/ pt states that now that she feels better she would like Dr. Joya Gaskins to set up the MRI/ please advise

## 2021-03-02 NOTE — Telephone Encounter (Signed)
MRI of neck will be ordered

## 2021-03-05 NOTE — Telephone Encounter (Signed)
Contacted pt to go over appointment information.   MRI is scheduled for September 2 at 4pm at [facility]. Pt will need to arrive at 330pm.   Pt is aware and doesn't have any questions or concerns

## 2021-03-08 ENCOUNTER — Other Ambulatory Visit: Payer: Self-pay | Admitting: Internal Medicine

## 2021-03-08 ENCOUNTER — Other Ambulatory Visit: Payer: Self-pay | Admitting: Critical Care Medicine

## 2021-03-08 ENCOUNTER — Ambulatory Visit: Payer: Medicaid Other

## 2021-03-08 ENCOUNTER — Other Ambulatory Visit: Payer: Self-pay

## 2021-03-08 DIAGNOSIS — I25118 Atherosclerotic heart disease of native coronary artery with other forms of angina pectoris: Secondary | ICD-10-CM

## 2021-03-08 MED ORDER — MONTELUKAST SODIUM 10 MG PO TABS
ORAL_TABLET | Freq: Every day | ORAL | 2 refills | Status: DC
  Filled 2021-03-08: qty 30, 30d supply, fill #0
  Filled 2021-05-02: qty 30, 30d supply, fill #1
  Filled 2021-06-05: qty 30, 30d supply, fill #2

## 2021-03-08 MED FILL — Levothyroxine Sodium Tab 100 MCG: ORAL | 30 days supply | Qty: 30 | Fill #2 | Status: AC

## 2021-03-08 MED FILL — Amlodipine Besylate Tab 5 MG (Base Equivalent): ORAL | 30 days supply | Qty: 30 | Fill #4 | Status: AC

## 2021-03-08 MED FILL — Gabapentin Tab 600 MG: ORAL | 30 days supply | Qty: 120 | Fill #2 | Status: AC

## 2021-03-09 ENCOUNTER — Other Ambulatory Visit: Payer: Self-pay

## 2021-03-12 ENCOUNTER — Other Ambulatory Visit: Payer: Self-pay

## 2021-03-12 MED ORDER — TICAGRELOR 60 MG PO TABS
ORAL_TABLET | ORAL | 5 refills | Status: DC
  Filled 2021-03-12: qty 60, 30d supply, fill #0
  Filled 2021-04-30: qty 60, 30d supply, fill #1
  Filled 2021-06-05: qty 60, 30d supply, fill #2
  Filled 2021-07-26: qty 180, 90d supply, fill #0

## 2021-03-13 ENCOUNTER — Other Ambulatory Visit: Payer: Self-pay

## 2021-03-16 ENCOUNTER — Other Ambulatory Visit: Payer: Self-pay

## 2021-03-16 ENCOUNTER — Ambulatory Visit (HOSPITAL_COMMUNITY)
Admission: RE | Admit: 2021-03-16 | Discharge: 2021-03-16 | Disposition: A | Discharge status: Home / Self Care | Payer: Medicaid Other | Source: Ambulatory Visit | Attending: Critical Care Medicine | Admitting: Critical Care Medicine

## 2021-03-16 DIAGNOSIS — G8929 Other chronic pain: Secondary | ICD-10-CM | POA: Insufficient documentation

## 2021-03-16 DIAGNOSIS — M542 Cervicalgia: Secondary | ICD-10-CM | POA: Insufficient documentation

## 2021-03-17 ENCOUNTER — Encounter (HOSPITAL_COMMUNITY): Payer: Self-pay

## 2021-03-17 ENCOUNTER — Telehealth (HOSPITAL_COMMUNITY): Payer: Self-pay | Admitting: Emergency Medicine

## 2021-03-17 ENCOUNTER — Emergency Department (HOSPITAL_COMMUNITY)
Admission: EM | Admit: 2021-03-17 | Discharge: 2021-03-17 | Disposition: A | Discharge status: Home / Self Care | Payer: Medicaid Other | Attending: Emergency Medicine | Admitting: Emergency Medicine

## 2021-03-17 DIAGNOSIS — I2510 Atherosclerotic heart disease of native coronary artery without angina pectoris: Secondary | ICD-10-CM | POA: Insufficient documentation

## 2021-03-17 DIAGNOSIS — Z91040 Latex allergy status: Secondary | ICD-10-CM | POA: Insufficient documentation

## 2021-03-17 DIAGNOSIS — F17210 Nicotine dependence, cigarettes, uncomplicated: Secondary | ICD-10-CM | POA: Insufficient documentation

## 2021-03-17 DIAGNOSIS — M25511 Pain in right shoulder: Secondary | ICD-10-CM | POA: Insufficient documentation

## 2021-03-17 DIAGNOSIS — J441 Chronic obstructive pulmonary disease with (acute) exacerbation: Secondary | ICD-10-CM | POA: Insufficient documentation

## 2021-03-17 DIAGNOSIS — Z7982 Long term (current) use of aspirin: Secondary | ICD-10-CM | POA: Insufficient documentation

## 2021-03-17 DIAGNOSIS — J45909 Unspecified asthma, uncomplicated: Secondary | ICD-10-CM | POA: Insufficient documentation

## 2021-03-17 DIAGNOSIS — E039 Hypothyroidism, unspecified: Secondary | ICD-10-CM | POA: Insufficient documentation

## 2021-03-17 DIAGNOSIS — R200 Anesthesia of skin: Secondary | ICD-10-CM | POA: Insufficient documentation

## 2021-03-17 DIAGNOSIS — Z7984 Long term (current) use of oral hypoglycemic drugs: Secondary | ICD-10-CM | POA: Insufficient documentation

## 2021-03-17 DIAGNOSIS — I129 Hypertensive chronic kidney disease with stage 1 through stage 4 chronic kidney disease, or unspecified chronic kidney disease: Secondary | ICD-10-CM | POA: Insufficient documentation

## 2021-03-17 DIAGNOSIS — E1122 Type 2 diabetes mellitus with diabetic chronic kidney disease: Secondary | ICD-10-CM | POA: Insufficient documentation

## 2021-03-17 DIAGNOSIS — M5412 Radiculopathy, cervical region: Secondary | ICD-10-CM

## 2021-03-17 DIAGNOSIS — N189 Chronic kidney disease, unspecified: Secondary | ICD-10-CM | POA: Insufficient documentation

## 2021-03-17 DIAGNOSIS — Z79899 Other long term (current) drug therapy: Secondary | ICD-10-CM | POA: Insufficient documentation

## 2021-03-17 MED ORDER — PREDNISONE 20 MG PO TABS
ORAL_TABLET | ORAL | 0 refills | Status: DC
  Filled 2021-03-17: qty 20, fill #0

## 2021-03-17 MED ORDER — PREDNISONE 20 MG PO TABS
60.0000 mg | ORAL_TABLET | Freq: Once | ORAL | Status: AC
  Administered 2021-03-17: 60 mg via ORAL
  Filled 2021-03-17: qty 3

## 2021-03-17 MED ORDER — TRAMADOL HCL 50 MG PO TABS: 50.0000 mg | ORAL_TABLET | Freq: Four times a day (QID) | ORAL | 0 refills | Status: DC | PRN

## 2021-03-17 MED ORDER — OXYCODONE-ACETAMINOPHEN 5-325 MG PO TABS
1.0000 | ORAL_TABLET | Freq: Once | ORAL | Status: AC
  Administered 2021-03-17: 1 via ORAL
  Filled 2021-03-17: qty 1

## 2021-03-17 MED ORDER — PREDNISONE 20 MG PO TABS
ORAL_TABLET | ORAL | 0 refills | Status: DC
Start: 2021-03-17 — End: 2021-07-04

## 2021-03-17 MED ORDER — TRAMADOL HCL 50 MG PO TABS
50.0000 mg | ORAL_TABLET | Freq: Four times a day (QID) | ORAL | 0 refills | Status: DC | PRN
  Filled 2021-03-17: qty 10, 3d supply, fill #0

## 2021-03-17 NOTE — Social Work (Signed)
CSW consulted via phone to assist with transportation needs. CSW met with Pt at bedside. Pt states that she has a friend who has invited her to stay with him for the remainder of the weekend while Pts daughter is out of town.  This friend will have someone pick Pt up from her home and drive Pt to pick up prescription and take Pt to friend's home later on today, but cannot provide transportation to Pts home from the hospital at d/c. CSW coordinated transportation for Pt to Pts home.

## 2021-03-17 NOTE — ED Provider Notes (Signed)
[facility] DEPT Provider Note   CSN: [id number] Arrival date & time: 03/17/21  0948     History Chief Complaint  Patient presents with   Neck Pain    [name] [name] is a 65 y.o. female.  Patient with history of lumbar surgery, cervical radiculopathy presents the emergency department for worsening pain in her right neck, shoulder, numbness in the thumb and tingling in her forearm.  Patient had an MRI ordered by PCP 2 weeks ago and this was performed yesterday.  Results are not yet available.  She states that she could not take the pain anymore so she called the ambulance and was transported to the hospital.  She denies any weakness in her legs, numbness or tingling.  She is still able to walk and care for self.  She states that she has been awake since 3 AM today.  She does not currently have pain medications.  She has been taking muscle relaxers without improvement. Patient denies warning symptoms of back pain including: fecal incontinence, urinary retention or overflow incontinence, night sweats, unexplained fevers or weight loss, h/o cancer, recent trauma.         Past Medical History:  Diagnosis Date   Allergy    seasonal   Anxiety    takes Lexapro daily   Arthritis    "back, from neck down pass my bra area" (03/25/2017)   Asthma    Bartholin gland cyst 08/29/2011   Bruises easily    pt is on Effient   Chronic back pain    herniated nucleus pulposus   Chronic back pain    "neck to bra area; lower back" (03/25/2017)   Chronic kidney disease    recurrent UTI's this year 2022   COPD (chronic obstructive pulmonary disease) (HCC)    early stages   Coronary artery disease    Depression    takes Klonopin daily   Diabetes mellitus without complication ([place])    Diverticulosis    GERD (gastroesophageal reflux disease)    takes Nexium daily   H/O hiatal hernia    Heart attack ([place]) 2011   Hemorrhoids    Hernia    Hyperlipidemia    takes Lipitor  daily   Hypertension    takes Losartan daily and Labetalol bid   Hypothyroidism    takes Synthroid daily   Insomnia    hydroxyzine prn   Joint pain    Pneumonia    "couple times" (03/25/2017)   Pre-diabetes    "just found out 1 wk ago" (03/25/2017)   Psoriasis    elbows,knees,back   Shortness of breath    with exertion   Slowing of urinary stream    Stress incontinence     Patient Active Problem List   Diagnosis Date Noted   Chronic neck pain 02/26/2021   GAD (generalized anxiety disorder) 01/08/2021   Acute cystitis without hematuria 11/07/2020   Influenza vaccine needed 06/15/2020   Abdominal pain, epigastric 04/28/2019   Esophageal dysphagia 04/28/2019   Bipolar I disorder ([place]) 09/17/2018   Insomnia 10/28/2017   Prediabetes 05/28/2017   QT prolongation 03/25/2017   Encounter for screening mammogram for breast cancer 09/11/2015   Psoriasis 06/22/2015   COPD (chronic obstructive pulmonary disease) (HCC)GOLD C  05/18/2015   Anxiety and depression 07/10/2013   Essential hypertension, benign 06/07/2013   Dyslipidemia 06/07/2013   Asthma, chronic 06/07/2013   Emphysema lung ([place]) 06/07/2013   Chronic back pain 06/07/2013   CAD S/P percutaneous coronary  angioplasty 06/07/2013   GERD (gastroesophageal reflux disease) 06/07/2013   Breast lump on left side at 3 o'clock position 10/13/2012   S/P abdominal hysterectomy and right salpingo-oophorectomy 08/29/2011   COPD exacerbation ([place]) 09/15/2009   TOBACCO ABUSE 07/17/2009   Chronic rhinitis 07/17/2009   Lung nodule < 6cm on CT 07/17/2009   ALLERGY, FOOD 07/17/2009   Hypothyroidism 12/22/2008    Past Surgical History:  Procedure Laterality Date   [place]   "left one of my ovaries"   BACK SURGERY     BIOPSY  05/27/2019   Procedure: BIOPSY;  Surgeon: Daneil Dolin, MD;  Location: [facility];  Service: Endoscopy;;   COLONOSCOPY  2011   Dr. Ardis Hughs: Mild  diverticulosis, descending diminutive colon polyp (not retrieved), next colonoscopy 10 years   CORONARY ANGIOPLASTY WITH STENT PLACEMENT  2011 X2   "regular stents didn't work; had to go back in in ~ 1 month and put in medicated stents"   [place]     ESOPHAGOGASTRODUODENOSCOPY     ESOPHAGOGASTRODUODENOSCOPY (EGD) WITH PROPOFOL N/A 05/27/2019   normal esophagus, dilation, erosive gastropathy s/p biopsy, normal duodenum. Negative H.pylori.    LEFT HEART CATH AND CORONARY ANGIOGRAPHY N/A 03/26/2017   Procedure: LEFT HEART CATH AND CORONARY ANGIOGRAPHY;  Surgeon: Belva Crome, MD;  Location: [facility];  Service: Cardiovascular;  Laterality: N/A;   LUMBAR LAMINECTOMY/DECOMPRESSION MICRODISCECTOMY  08/05/2011   Procedure: LUMBAR LAMINECTOMY/DECOMPRESSION MICRODISCECTOMY;  Surgeon: Otilio Connors, MD;  Location: [facility];  Service: Neurosurgery;  Laterality: Right;  Right Lumbar four-five extraforaminal discectomy   MALONEY DILATION N/A 05/27/2019   Procedure: Venia Minks DILATION;  Surgeon: Daneil Dolin, MD;  Location: [facility];  Service: Endoscopy;  Laterality: N/A;  69   TONSILLECTOMY     as a child   UPPER GASTROINTESTINAL ENDOSCOPY       OB History     Gravida  1   Para  1   Term  1   Preterm  0   AB  0   Living  1      SAB  0   IAB  0   Ectopic  0   Multiple  0   Live Births              Family History  Problem Relation Age of Onset   Heart disease Mother    Hypertension Mother    Stroke Mother    Mental illness Mother    Heart disease Father    Hypertension Father    Diabetes Father    Colon polyps Brother    Breast cancer Maternal Aunt    Throat cancer Maternal Uncle    Thyroid disease Paternal Aunt    Heart disease Maternal Grandfather    Cancer Paternal Grandmother        mouth   Anesthesia problems Daughter    Hypotension Neg Hx    Malignant hyperthermia Neg Hx    Pseudochol deficiency Neg Hx    Colon  cancer Neg Hx    Stomach cancer Neg Hx    Esophageal cancer Neg Hx    Pancreatic cancer Neg Hx    Rectal cancer Neg Hx     Social History   Tobacco Use   Smoking status: Every Day    Packs/day: 1.00    Years: 50.00    Pack years: 50.00    Types: Cigarettes  Smokeless tobacco: Never  Vaping Use   Vaping Use: Never used  Substance Use Topics   Alcohol use: Yes    Comment: once a week   Drug use: No    Types: Cocaine    Comment: has been years per pt    Home Medications Prior to Admission medications   Medication Sig Start Date End Date Taking? Authorizing Provider  albuterol (VENTOLIN HFA) 108 (90 Base) MCG/ACT inhaler INHALE 2 PUFFS INTO THE LUNGS EVERY 6 (SIX) HOURS AS NEEDED FOR WHEEZING OR SHORTNESS OF BREATH. 01/22/21   Ladell Pier, MD  amLODipine (NORVASC) 5 MG tablet TAKE 1 TABLET (5 MG TOTAL) BY MOUTH DAILY. 06/15/20 06/15/21  Ladell Pier, MD  aspirin EC 81 MG tablet Take 81 mg by mouth daily.    [provider]  atorvastatin (LIPITOR) 80 MG tablet Take 1 tablet (80 mg total) by mouth daily. 12/06/20 03/17/21  Camillia Herter, NP  Blood Glucose Monitoring Suppl (TRUE METRIX METER) DEVI 1 kit by Does not apply route 4 (four) times daily. Patient not taking: No sig reported 04/04/17   Brayton Caves, PA-C  busPIRone (BUSPAR) 15 MG tablet TAKE 2 TABLETS IN THE MORNING, 1 TAB AT NOON AND 1 TAB IN THE EVENING Patient taking differently: Take 3 at bedtime 01/08/21 01/08/22  [place]ev[place] Crane, MD  cetirizine (ZYRTEC) 10 MG tablet Take 10 mg by mouth as needed.    [provider]  clonazePAM (KLONOPIN) 0.5 MG tablet Take 1 tablet (0.5 mg total) by mouth 2 (two) times daily as needed for anxiety. 01/08/21   [place] Crane, MD  doxepin (SINEQUAN) 50 MG capsule TAKE 1 CAPSULE (50 MG TOTAL) BY MOUTH AT BEDTIME. 01/08/21 01/08/22  [place] Crane, MD  doxycycline (VIBRA-TABS) 100 MG tablet Take 1 tablet (100 mg total) by mouth 2 (two) times daily. 02/26/21   Elsie Stain, MD  escitalopram (LEXAPRO) 10 MG tablet TAKE 3 TABLETS (30 MG TOTAL) BY MOUTH DAILY. 01/08/21 01/08/22  [place] Crane, MD  Fluticasone-Umeclidin-Vilant (TRELEGY ELLIPTA) 100-62.5-25 MCG/INH AEPB Inhale 1 puff into the lungs daily. 02/26/21   Elsie Stain, MD  furosemide (LASIX) 20 MG tablet Take 1 tablet (20 mg total) by mouth daily. 07/24/20   Ladell Pier, MD  gabapentin (NEURONTIN) 600 MG tablet TAKE 1 TABLET BY MOUTH 4 TIMES DAILY 09/29/20 09/29/21  Ladell Pier, MD  glucose blood (TRUE METRIX BLOOD GLUCOSE TEST) test strip Use as instructed 01/21/18   Ladell Pier, MD  ipratropium-albuterol (DUONEB) 0.5-2.5 (3) MG/3ML SOLN TAKE 3 MLS VIA NEBULIZATION EVERY 6 HOURS Patient taking differently: TAKE 3 MLS VIA NEBULIZATION EVERY 6 HOURS as needed 11/18/19   Elsie Stain, MD  levothyroxine (SYNTHROID) 100 MCG tablet TAKE 1 TABLET (100 MCG TOTAL) BY MOUTH DAILY. 06/15/20 06/15/21  Ladell Pier, MD  levothyroxine (SYNTHROID) 25 MCG tablet Take 1 tablet (25 mcg total) by mouth daily. In addition to 166mg tablet daily 11/28/20   MArgentina Donovan PA-C  losartan (COZAAR) 50 MG tablet TAKE 1 & 1/2 TABLETS BY MOUTH DAILY. 06/15/20 06/15/21  JLadell Pier MD  metFORMIN (GLUCOPHAGE) 500 MG tablet TAKE 1 TABLET (500 MG TOTAL) BY MOUTH 2 (TWO) TIMES DAILY WITH A MEAL. 11/28/20 11/28/21  MArgentina Donovan PA-C  methocarbamol (ROBAXIN) 500 MG tablet Take 1 tablet (500 mg total) by mouth every 6 (six) hours as needed for muscle spasms. 02/26/21   WElsie Stain MD  mirtazapine (REMERON) 30 MG  tablet TAKE 1 TABLET (30 MG TOTAL) BY MOUTH AT BEDTIME. 01/02/21 01/02/22  [place] Crane, MD  montelukast (SINGULAIR) 10 MG tablet TAKE 1 TABLET BY MOUTH AT BEDTIME. 03/08/21 03/08/22  Elsie Stain, MD  nebivolol (BYSTOLIC) 10 MG tablet TAKE 1 TABLET (10 MG TOTAL) BY MOUTH DAILY. 06/15/20 06/15/21  Ladell Pier, MD  nitroGLYCERIN (NITROSTAT) 0.4 MG SL tablet Place 1 tablet (0.4 mg  total) under the tongue every 5 (five) minutes as needed for chest pain. 11/08/19 01/29/21  Kroeger, Lorelee Cover., PA-C  omeprazole (PRILOSEC) 40 MG capsule TAKE 1 CAPSULE (40 MG TOTAL) BY MOUTH 2 (TWO) TIMES DAILY BEFORE A MEAL. 02/12/21 02/12/22  Ladell Pier, MD  predniSONE (DELTASONE) 10 MG tablet Take 4 tablets (40 mg total) by mouth daily. 02/26/21   Elsie Stain, MD  promethazine-dextromethorphan (PROMETHAZINE-DM) 6.25-15 MG/5ML syrup Take 5 mLs by mouth 4 (four) times daily as needed for cough. 02/26/21   Elsie Stain, MD  sucralfate (CARAFATE) 1 g tablet Take 1 tablet (1 g total) by mouth 4 (four) times daily -  with meals and at bedtime. 02/26/21 03/28/21  Elsie Stain, MD  ticagrelor (BRILINTA) 60 MG TABS tablet TAKE 1 TABLET (60 MG TOTAL) BY MOUTH 2 (TWO) TIMES DAILY. 03/12/21 03/12/22  Ladell Pier, MD  triamcinolone cream (KENALOG) 0.1 % APPLY 1 APPLICATION TOPICALLY 2 TIMES DAILY. Patient not taking: No sig reported 06/19/18   Ladell Pier, MD  TRUEPLUS LANCETS 28G MISC 28 g by Does not apply route QID. Patient not taking: No sig reported 04/04/17   Brayton Caves, PA-C  Vitamin E 180 MG CAPS Take 180 mg by mouth daily.    [provider]    Allergies    Avocado, Latex, and Codeine  Review of Systems   Review of Systems  Constitutional:  Negative for fever and unexpected weight change.  Gastrointestinal:  Negative for constipation.       Negative for fecal incontinence.   Genitourinary:  Negative for dysuria and flank pain.       Negative for urinary incontinence or retention.  Musculoskeletal:  Positive for neck pain. Negative for back pain.  Neurological:  Positive for weakness and numbness.       Denies saddle paresthesias.   Physical Exam Updated Vital Signs BP 139/69   Pulse 62   Temp 98.1 F (36.7 C) (Oral)   Resp 20   Ht _0  (1.549 m)   Wt 60 kg   SpO2 99%   BMI 24.99 kg/m   Physical Exam Vitals and nursing note reviewed.   Constitutional:      Appearance: She is well-developed.  HENT:     Head: Normocephalic and atraumatic.  Eyes:     Conjunctiva/sclera: Conjunctivae normal.  Pulmonary:     Effort: Pulmonary effort is normal.  Abdominal:     Palpations: Abdomen is soft.     Tenderness: There is no abdominal tenderness.  Musculoskeletal:        General: Normal range of motion.     Cervical back: Normal range of motion and neck supple.     Comments: No step-off noted with palpation of spine.  Tenderness over the right side of the lower cervical spine and posterior shoulder.  Skin:    General: Skin is warm and dry.     Findings: No rash.  Neurological:     Mental Status: She is alert.     Sensory: No sensory deficit.  Deep Tendon Reflexes: Reflexes are normal and symmetric.     Comments: Upper extremity myotomes tested bilaterally:  C5 Shoulder abduction 5/5 C6 Elbow flexion/wrist extension 5/5 C7 Elbow extension 5/5 C8 Finger flexion 5/5 T1 Finger abduction 4+/5 (right)  Sensation intact distally.   Gross LE strength is normal.      ED Results / Procedures / Treatments   Labs (all labs ordered are listed, but only abnormal results are displayed) Labs Reviewed - No data to display  EKG None  Radiology No results found.  Procedures Procedures   Medications Ordered in ED Medications  oxyCODONE-acetaminophen (PERCOCET/ROXICET) 5-325 MG per tablet 1 tablet (has no administration in time range)  predniSONE (DELTASONE) tablet 60 mg (has no administration in time range)    ED Course  I have reviewed the triage vital signs and the nursing notes.  Pertinent labs & imaging results that were available during my care of the patient were reviewed by me and considered in my medical decision making (see chart for details).  Patient seen and examined. Medications ordered.   Vital signs reviewed and are as follows: BP 139/69   Pulse 62   Temp 98.1 F (36.7 C) (Oral)   Resp 20   Ht  _0  (1.549 m)   Wt 60 kg   SpO2 99%   BMI 24.99 kg/m   Without red flags.  Will give oral pain medication and started on tapered course of prednisone.  Her last course of steroids were about 2 to 3 weeks ago for COPD exacerbation.  Patient counseled on use of narcotic pain medications. Counseled not to combine these medications with others containing tylenol. Urged not to drink alcohol, drive, or perform any other activities that requires focus while taking these medications. The patient verbalizes understanding and agrees with the plan.    MDM Rules/Calculators/A&P                           Patient with symptoms consistent with cervical radiculopathy.  She had MRI yesterday, awaiting results.  No red flags today.  She will be started on steroid taper and given tramadol.  Last prescription for this was a couple of weeks ago and she tolerated.  Neurosurgery follow-up given.   Final Clinical Impression(s) / ED Diagnoses Final diagnoses:  Cervical radiculopathy    Rx / DC Orders ED Discharge Orders          Ordered    predniSONE (DELTASONE) 20 MG tablet        03/17/21 1301    traMADol (ULTRAM) 50 MG tablet  Every 6 hours PRN        03/17/21 1301             Carlisle Cater, PA-C 03/17/21 1303    Jeanell Sparrow, DO 03/17/21 1826

## 2021-03-17 NOTE — ED Triage Notes (Signed)
Pt arrived via EMS, from home, c/o left shoulder and neck pain x3 weeks. Pt was seen by PCP, given muscle relaxer with no relief.

## 2021-03-17 NOTE — Telephone Encounter (Signed)
Asked to send medication to [facility] at [facility]. This was done.

## 2021-03-17 NOTE — ED Triage Notes (Signed)
Pt had an MRI yesterday at [facility] and was diagnosed with a pinched nerve in her neck. Pt states the Robaxin is not helping the pain and numbness to the right arm.

## 2021-03-17 NOTE — Discharge Instructions (Signed)
Please read and follow all provided instructions.  Your diagnoses today include:  1. Cervical radiculopathy     Tests performed today include: Vital signs - see below for your results today  Medications prescribed:  Prednisone - steroid medicine   It is best to take this medication in the morning to prevent sleeping problems. If you are diabetic, monitor your blood sugar closely and stop taking Prednisone if blood sugar is over 300. Take with food to prevent stomach upset.   Tramadol - narcotic-like pain medication  DO NOT drive or perform any activities that require you to be awake and alert because this medicine can make you drowsy.   Take any prescribed medications only as directed.  Home care instructions:  Follow any educational materials contained in this packet Please rest, use ice or heat on your back for the next several days Do not lift, push, pull anything more than 10 pounds for the next week  Follow-up instructions: Please follow-up with your primary care provider or the neurosurgeon for further evaluation of your symptoms.   Return instructions:  SEEK IMMEDIATE MEDICAL ATTENTION IF YOU HAVE: New numbness, tingling, weakness, or problem with the use of your arms or legs Severe back pain not relieved with medications Loss control of your bowels or bladder Increasing pain in any areas of the body (such as chest or abdominal pain) Shortness of breath, dizziness, or fainting.  Worsening nausea (feeling sick to your stomach), vomiting, fever, or sweats Any other emergent concerns regarding your health   Additional Information:  Your vital signs today were: BP 139/69   Pulse 62   Temp 98.1 F (36.7 C) (Oral)   Resp 20   Ht '5\' 1"'$  (1.549 m)   Wt 60 kg   SpO2 99%   BMI 24.99 kg/m  If your blood pressure (BP) was elevated above 135/85 this visit, please have this repeated by your doctor within one month. --------------

## 2021-03-20 ENCOUNTER — Other Ambulatory Visit: Payer: Self-pay

## 2021-03-22 ENCOUNTER — Ambulatory Visit: Payer: Self-pay

## 2021-03-22 NOTE — Telephone Encounter (Signed)
Patient called and says she's having severe back pain. She says it started last weekend and she went to the ED. She says the MRI showed arthritis, disc problems and other things. She says the medication they prescribed is not really helping. She says the pain is a 10/10. She says she has pain in her neck, down her spine, right arm with numbness from her thumb to the elbow. No other symptoms. I advised she could go back to the ED if the pain is so severe she can't wait to see the provider tomorrow as scheduled at [facility]. She says she will wait until tomorrow, she doesn't want to go to the ED if she can wait. Care advice given, patient verbalized understanding.  Reason for Disposition  [1] SEVERE back pain (e.g., excruciating, unable to do any normal activities) AND [2] not improved 2 hours after pain medicine  Answer Assessment - Initial Assessment Questions 1. ONSET: "When did the pain begin?"      Last weekend 2. LOCATION: "Where does it hurt?" (upper, mid or lower back)     Neck and spine, right arm 3. SEVERITY: "How bad is the pain?"  (e.g., Scale 1-10; mild, moderate, or severe)   - MILD (1-3): doesn't interfere with normal activities    - MODERATE (4-7): interferes with normal activities or awakens from sleep    - SEVERE (8-10): excruciating pain, unable to do any normal activities      10 4. PATTERN: "Is the pain constant?" (e.g., yes, no; constant, intermittent)      Constant 5. RADIATION: "Does the pain shoot into your legs or elsewhere?"     No 6. CAUSE:  "What do you think is causing the back pain?"      Based on the MRI-arthritis, bulging disc 7. BACK OVERUSE:  "Any recent lifting of heavy objects, strenuous work or exercise?"     No 8. MEDICATIONS: "What have you taken so far for the pain?" (e.g., nothing, acetaminophen, NSAIDS)     Aleve, Tramadol, Prednisone, Sucralfate 1 gm 9. NEUROLOGIC SYMPTOMS: "Do you have any weakness, numbness, or problems with bowel/bladder control?"      Numbness from right arm from thumb to elbow 10. OTHER SYMPTOMS: "Do you have any other symptoms?" (e.g., fever, abdominal pain, burning with urination, blood in urine)       No 11. PREGNANCY: "Is there any chance you are pregnant?" (e.g., yes, no; LMP)       No  Protocols used: Back Pain-A-AH

## 2021-03-23 ENCOUNTER — Telehealth (INDEPENDENT_AMBULATORY_CARE_PROVIDER_SITE_OTHER): Payer: Self-pay

## 2021-03-23 ENCOUNTER — Encounter (INDEPENDENT_AMBULATORY_CARE_PROVIDER_SITE_OTHER): Payer: Self-pay | Admitting: Primary Care

## 2021-03-23 ENCOUNTER — Other Ambulatory Visit: Payer: Self-pay | Admitting: Critical Care Medicine

## 2021-03-23 ENCOUNTER — Other Ambulatory Visit: Payer: Self-pay

## 2021-03-23 ENCOUNTER — Ambulatory Visit (INDEPENDENT_AMBULATORY_CARE_PROVIDER_SITE_OTHER): Payer: Self-pay | Admitting: Primary Care

## 2021-03-23 VITALS — BP 179/95 | HR 71 | Temp 97.3°F | Resp 16 | Wt 139.0 lb

## 2021-03-23 DIAGNOSIS — M545 Low back pain, unspecified: Secondary | ICD-10-CM

## 2021-03-23 DIAGNOSIS — G8929 Other chronic pain: Secondary | ICD-10-CM

## 2021-03-23 MED ORDER — KETOROLAC TROMETHAMINE 60 MG/2ML IM SOLN
60.0000 mg | Freq: Once | INTRAMUSCULAR | Status: AC
Start: 2021-03-23 — End: 2021-03-23
  Administered 2021-03-23: 60 mg via INTRAMUSCULAR

## 2021-03-23 MED FILL — Nebivolol HCl Tab 10 MG (Base Equivalent): ORAL | 30 days supply | Qty: 30 | Fill #2 | Status: AC

## 2021-03-23 MED FILL — Losartan Potassium Tab 50 MG: ORAL | 30 days supply | Qty: 45 | Fill #3 | Status: AC

## 2021-03-23 NOTE — Telephone Encounter (Signed)
Copied from [place] [id number]. Topic: General - Other >> Mar 23, 2021 12:08 PM Pawlus, Brayton Layman A wrote: Reason for CRM: Pt was seen today but called in stating she needs pain medication and was not given it at her office visit. Please advise if pt can get a referral to pain management.

## 2021-03-23 NOTE — Progress Notes (Signed)
[facility] ACUTE VISIT: Subjective:    [name] is a 65 y.o. female who presents for evaluation of low back pain. The patient has had recurrent self limited episodes of low back pain in the past. Symptoms have been present for a period of time.  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  Onset was related to / precipitated by hx of back pain. The pain is located in the right lumbar area or left lumbar area and does not radiate. The pain is described as aching, soreness, and throbbing and occurs all day. She is currently in no pain. Symptoms are exacerbated by lifting, lying down, and sitting. Symptoms are improved by narcotic pain medications. She has also tried muscle relaxants and narcotic pain medications which provided no symptom relief. She has no other symptoms associated with the back pain. Patient requesting percocet's.  Aggravating factors: certain movements and prolonged walking/standing. Alleviating factors: rest. Progressive LE weakness or saddle anesthesia: none. Extremity sensation changes or weakness: none. Ambulatory with difficulty. Normal bowel/bladder habits: yes; without urinary retention. Normal PO intake without n/v. No associated abdominal pain/n/v. Self treatment: has OTC analgesics, with minimal relief. She does have a h/o back surgeries.  Chief Complaint  Patient presents with   Back Pain   Past Medical History:  Diagnosis Date   Allergy    seasonal   Anxiety    takes Lexapro daily   Arthritis    "back, from neck down pass my bra area" (03/25/2017)   Asthma    Bartholin gland cyst 08/29/2011   Bruises easily    pt is on Effient   Chronic back pain    herniated nucleus pulposus   Chronic back pain    "neck to bra area; lower back" (03/25/2017)   Chronic kidney disease    recurrent UTI's this year 2022   COPD (chronic obstructive pulmonary disease) (HCC)    early stages   Coronary artery disease     Depression    takes Klonopin daily   Diabetes mellitus without complication (HCC)    Diverticulosis    GERD (gastroesophageal reflux disease)    takes Nexium daily   H/O hiatal hernia    Heart attack ([place]) 2011   Hemorrhoids    Hernia    Hyperlipidemia    takes Lipitor daily   Hypertension    takes Losartan daily and Labetalol bid   Hypothyroidism    takes Synthroid daily   Insomnia    hydroxyzine prn   Joint pain    Pneumonia    "couple times" (03/25/2017)   Pre-diabetes    "just found out 1 wk ago" (03/25/2017)   Psoriasis    elbows,knees,back   Shortness of breath    with exertion   Slowing of urinary stream    Stress incontinence     Past Surgical History:  Procedure Laterality Date   [place]   "left one of my ovaries"   BACK SURGERY     BIOPSY  05/27/2019   Procedure: BIOPSY;  Surgeon: Daneil Dolin, MD;  [facility];  Service: Endoscopy;;   COLONOSCOPY  2011   Dr. Ardis Hughs: Mild diverticulosis, descending diminutive colon polyp (not retrieved), next colonoscopy 10 years   CORONARY ANGIOPLASTY WITH STENT PLACEMENT  2011 X2   "regular stents didn't work; had to go back in in ~ 1 month and put in  medicated stents"   DILATION AND CURETTAGE OF UTERUS     ESOPHAGOGASTRODUODENOSCOPY     ESOPHAGOGASTRODUODENOSCOPY (EGD) WITH PROPOFOL N/A 05/27/2019   normal esophagus, dilation, erosive gastropathy s/p biopsy, normal duodenum. Negative H.pylori.    LEFT HEART CATH AND CORONARY ANGIOGRAPHY N/A 03/26/2017   Procedure: LEFT HEART CATH AND CORONARY ANGIOGRAPHY;  Surgeon: Belva Crome, MD;  Location: [facility];  Service: Cardiovascular;  Laterality: N/A;   LUMBAR LAMINECTOMY/DECOMPRESSION MICRODISCECTOMY  08/05/2011   Procedure: LUMBAR LAMINECTOMY/DECOMPRESSION MICRODISCECTOMY;  Surgeon: Otilio Connors, MD;  Location: [place] [facility];  Service: Neurosurgery;  Laterality: Right;  Right Lumbar  four-five extraforaminal discectomy   MALONEY DILATION N/A 05/27/2019   Procedure: Venia Minks DILATION;  Surgeon: Daneil Dolin, MD;  [facility]: [facility];  Service: Endoscopy;  Laterality: N/A;  37   TONSILLECTOMY     as a child   UPPER GASTROINTESTINAL ENDOSCOPY      Family History  Problem Relation Age of Onset   Heart disease Mother    Hypertension Mother    Stroke Mother    Mental illness Mother    Heart disease Father    Hypertension Father    Diabetes Father    Colon polyps Brother    Breast cancer Maternal Aunt    Throat cancer Maternal Uncle    Thyroid disease Paternal Aunt    Heart disease Maternal Grandfather    Cancer Paternal Grandmother        [place]   Anesthesia problems Daughter    Hypotension Neg Hx    Malignant hyperthermia Neg Hx    Pseudochol deficiency Neg Hx    Colon cancer Neg Hx    Stomach cancer Neg Hx    Esophageal cancer Neg Hx    Pancreatic cancer Neg Hx    Rectal cancer Neg Hx     Social History   Socioeconomic History   Marital status: Divorced    Spouse name: Not on file   Number of children: Not on file   Years of education: Not on file   Highest education level: Not on file  Occupational History   Not on file  Tobacco Use   Smoking status: Every Day    Packs/day: 1.00    Years: 50.00    Pack years: 50.00    Types: Cigarettes   Smokeless tobacco: Never  Vaping Use   Vaping Use: Never used  Substance and Sexual Activity   Alcohol use: Yes    Comment: once a week   Drug use: No    Types: Cocaine    Comment: has been years per pt   Sexual activity: Not Currently    Birth control/protection: Surgical  Other Topics Concern   Not on file  Social History Narrative   Not on file   Social Determinants of Health   Financial Resource Strain: Not on file  Food Insecurity: Not on file  Transportation Needs: Not on file  Physical Activity: Not on file  Stress: Not on file  Social Connections: Not on file  Intimate Partner  Violence: Not on file    Outpatient Medications Prior to Visit  Medication Sig Dispense Refill   albuterol (VENTOLIN HFA) 108 (90 Base) MCG/ACT inhaler INHALE 2 PUFFS INTO THE LUNGS EVERY 6 (SIX) HOURS AS NEEDED FOR WHEEZING OR SHORTNESS OF BREATH. 18 g 2   amLODipine (NORVASC) 5 MG tablet TAKE 1 TABLET (5 MG TOTAL) BY MOUTH DAILY. 90 tablet 3   aspirin EC 81 MG  tablet Take 81 mg by mouth daily.     Blood Glucose Monitoring Suppl (TRUE METRIX METER) DEVI 1 kit by Does not apply route 4 (four) times daily. 1 Device 0   busPIRone (BUSPAR) 15 MG tablet TAKE 2 TABLETS IN THE MORNING, 1 TAB AT NOON AND 1 TAB IN THE EVENING (Patient taking differently: Take 3 at bedtime) 120 tablet 2   cetirizine (ZYRTEC) 10 MG tablet Take 10 mg by mouth as needed.     clonazePAM (KLONOPIN) 0.5 MG tablet Take 1 tablet (0.5 mg total) by mouth 2 (two) times daily as needed for anxiety. 60 tablet 2   doxepin (SINEQUAN) 50 MG capsule TAKE 1 CAPSULE (50 MG TOTAL) BY MOUTH AT BEDTIME. 30 capsule 2   doxycycline (VIBRA-TABS) 100 MG tablet Take 1 tablet (100 mg total) by mouth 2 (two) times daily. 14 tablet 0   escitalopram (LEXAPRO) 10 MG tablet TAKE 3 TABLETS (30 MG TOTAL) BY MOUTH DAILY. 90 tablet 2   Fluticasone-Umeclidin-Vilant (TRELEGY ELLIPTA) 100-62.5-25 MCG/INH AEPB Inhale 1 puff into the lungs daily. 60 each 4   furosemide (LASIX) 20 MG tablet Take 1 tablet (20 mg total) by mouth daily. 30 tablet 6   gabapentin (NEURONTIN) 600 MG tablet TAKE 1 TABLET BY MOUTH 4 TIMES DAILY 360 tablet 1   glucose blood (TRUE METRIX BLOOD GLUCOSE TEST) test strip Use as instructed 100 each 12   ipratropium-albuterol (DUONEB) 0.5-2.5 (3) MG/3ML SOLN TAKE 3 MLS VIA NEBULIZATION EVERY 6 HOURS (Patient taking differently: TAKE 3 MLS VIA NEBULIZATION EVERY 6 HOURS as needed) 360 mL 1   levothyroxine (SYNTHROID) 100 MCG tablet TAKE 1 TABLET (100 MCG TOTAL) BY MOUTH DAILY. 30 tablet 6   levothyroxine (SYNTHROID) 25 MCG tablet Take 1 tablet  (25 mcg total) by mouth daily. In addition to 119mg tablet daily 90 tablet 3   losartan (COZAAR) 50 MG tablet TAKE 1 & 1/2 TABLETS BY MOUTH DAILY. 45 tablet 6   metFORMIN (GLUCOPHAGE) 500 MG tablet TAKE 1 TABLET (500 MG TOTAL) BY MOUTH 2 (TWO) TIMES DAILY WITH A MEAL. 60 tablet 3   methocarbamol (ROBAXIN) 500 MG tablet Take 1 tablet (500 mg total) by mouth every 6 (six) hours as needed for muscle spasms. 60 tablet 1   mirtazapine (REMERON) 30 MG tablet TAKE 1 TABLET (30 MG TOTAL) BY MOUTH AT BEDTIME. 30 tablet 2   montelukast (SINGULAIR) 10 MG tablet TAKE 1 TABLET BY MOUTH AT BEDTIME. 30 tablet 2   nebivolol (BYSTOLIC) 10 MG tablet TAKE 1 TABLET (10 MG TOTAL) BY MOUTH DAILY. 90 tablet 4   omeprazole (PRILOSEC) 40 MG capsule TAKE 1 CAPSULE (40 MG TOTAL) BY MOUTH 2 (TWO) TIMES DAILY BEFORE A MEAL. 120 capsule 0   predniSONE (DELTASONE) 20 MG tablet 3 Tabs PO Days 1-3, then 2 tabs PO Days 4-6, then 1 tab PO Day 7-9, then Half Tab PO Day 10-12 20 tablet 0   promethazine-dextromethorphan (PROMETHAZINE-DM) 6.25-15 MG/5ML syrup Take 5 mLs by mouth 4 (four) times daily as needed for cough. 180 mL 0   sucralfate (CARAFATE) 1 g tablet Take 1 tablet (1 g total) by mouth 4 (four) times daily -  with meals and at bedtime. 120 tablet 0   ticagrelor (BRILINTA) 60 MG TABS tablet TAKE 1 TABLET (60 MG TOTAL) BY MOUTH 2 (TWO) TIMES DAILY. 60 tablet 5   traMADol (ULTRAM) 50 MG tablet Take 1 tablet (50 mg total) by mouth every 6 (six) hours as needed. 10 tablet  0   triamcinolone cream (KENALOG) 0.1 % APPLY 1 APPLICATION TOPICALLY 2 TIMES DAILY. 30 g 1   TRUEPLUS LANCETS 28G MISC 28 g by Does not apply route QID. 120 each 2   Vitamin E 180 MG CAPS Take 180 mg by mouth daily.     atorvastatin (LIPITOR) 80 MG tablet Take 1 tablet (80 mg total) by mouth daily. 90 tablet 0   nitroGLYCERIN (NITROSTAT) 0.4 MG SL tablet Place 1 tablet (0.4 mg total) under the tongue every 5 (five) minutes as needed for chest pain. 90 tablet 3    No facility-administered medications prior to visit.    Allergies  Allergen Reactions   Avocado Anaphylaxis   Latex Shortness Of Breath and Rash   Codeine Nausea Only    Review of Systems  Musculoskeletal:  Positive for back pain and gait problem.  All other systems reviewed and are negative.     Objective:    Physical Exam Vitals reviewed.  Constitutional:      Appearance: Normal appearance. She is normal weight.     Comments: Crying about how much pain she was in  HENT:     Head: Normocephalic.     Right Ear: Tympanic membrane and external ear normal.     Left Ear: Tympanic membrane and external ear normal.     Nose: Nose normal.  Eyes:     Extraocular Movements: Extraocular movements intact.     Pupils: Pupils are equal, round, and reactive to light.  Cardiovascular:     Rate and Rhythm: Normal rate and regular rhythm.  Pulmonary:     Effort: Pulmonary effort is normal.     Breath sounds: Normal breath sounds.  Abdominal:     General: Abdomen is flat.     Palpations: Abdomen is soft.  Musculoskeletal:        General: Tenderness present.     Cervical back: Rigidity present.  Skin:    General: Skin is warm and dry.  Neurological:     Mental Status: She is alert and oriented to person, place, and time.  Psychiatric:        Mood and Affect: Mood normal.        Behavior: Behavior normal.        Thought Content: Thought content normal.        Judgment: Judgment normal.   BP (!) 179/95   Pulse 71   Temp (!) 97.3 F (36.3 C)   Resp 16   Wt 139 lb (63 kg)   SpO2 95%   BMI 26.26 kg/m  Wt Readings from Last 3 Encounters:  03/23/21 139 lb (63 kg)  03/17/21 132 lb 4.4 oz (60 kg)  02/26/21 132 lb 6.4 oz (60.1 kg)    Health Maintenance Due  Topic Date Due   FOOT EXAM  Never done   OPHTHALMOLOGY EXAM  Never done   Hepatitis C Screening  Never done   Zoster Vaccines- Shingrix (1 of 2) Never done   MAMMOGRAM  06/01/2011   Pneumococcal Vaccine 0-64 Years  old (2 - PCV) 03/27/2018   COVID-19 Vaccine (3 - Booster for Pfizer series) 03/15/2020   INFLUENZA VACCINE  02/12/2021    There are no preventive care reminders to display for this patient.   Lab Results  Component Value Date   TSH 6.900 (H) 11/24/2020   Lab Results  Component Value Date   WBC 22.6 (H) 02/11/2021   HGB 15.2 (H) 02/11/2021   HCT 44.9 02/11/2021  MCV 97.8 02/11/2021   PLT 437 (H) 02/11/2021   Lab Results  Component Value Date   NA 139 02/11/2021   K 3.9 02/11/2021   CO2 24 02/11/2021   GLUCOSE 185 (H) 02/11/2021   BUN 9 02/11/2021   CREATININE 0.81 02/11/2021   BILITOT 0.9 02/11/2021   ALKPHOS 97 02/11/2021   AST 21 02/11/2021   ALT 14 02/11/2021   PROT 7.4 02/11/2021   ALBUMIN 4.1 02/11/2021   CALCIUM 9.5 02/11/2021   ANIONGAP 11 02/11/2021   EGFR 82 11/24/2020   GFR 70.52 08/30/2020   Lab Results  Component Value Date   CHOL 147 11/24/2020   Lab Results  Component Value Date   HDL 58 11/24/2020   Lab Results  Component Value Date   LDLCALC 69 11/24/2020   Lab Results  Component Value Date   TRIG 109 11/24/2020   Lab Results  Component Value Date   CHOLHDL 2.5 11/24/2020   Lab Results  Component Value Date   HGBA1C 6.0 (H) 11/24/2020       Assessment & Plan:  Vieno was seen today for back pain.  Diagnoses and all orders for this visit:  Chronic low back pain, unspecified back pain laterality, unspecified whether sciatica present -     ketorolac (TORADOL) injection 60 mg Refer to pain management and follow up with PCP first available appt.   Return for First avaiable with PCP .   The above assessment and management plan was discussed with the patient. The patient verbalized understanding of and has agreed to the management plan. Patient is aware to call the clinic if symptoms persist or worsen. Patient is aware when to return to the clinic for a follow-up visit. Patient educated on when it is appropriate to go to the  emergency department.   This note has been created with Surveyor, quantity. Any transcriptional errors are unintentional.   Kerin Perna, NP 03/25/2021, 10:03 PM

## 2021-03-24 NOTE — Telephone Encounter (Signed)
Requested medication (s) are due for refill today: yes  Requested medication (s) are on the active medication list: yes  Last refill:  03/17/21 #10 (from ED visit)  Future visit scheduled: no  Notes to clinic:  med not delegated to NT to RF   Requested Prescriptions  Pending Prescriptions Disp Refills   traMADol (ULTRAM) 50 MG tablet 20 tablet 0    Sig: Take 1 tablet (50 mg total) by mouth every 8 (eight) hours as needed for up to 5 days.     Not Delegated - Analgesics:  Opioid Agonists Failed - 03/23/2021  2:19 PM      Failed - This refill cannot be delegated      Failed - Urine Drug Screen completed in last 360 days      Passed - Valid encounter within last 6 months    Recent Outpatient Visits           Yesterday Chronic low back pain, unspecified back pain laterality, unspecified whether sciatica present   [place], Michelle P, NP   3 weeks ago COPD exacerbation [facility])   [place] Elsie Stain, MD   2 months ago Acute cystitis without hematuria   [place], MD   4 months ago Prediabetes   [place], Levada Dy M, [place]   9 months ago Prediabetes   [place] Ladell Pier, MD

## 2021-03-25 ENCOUNTER — Encounter (INDEPENDENT_AMBULATORY_CARE_PROVIDER_SITE_OTHER): Payer: Self-pay | Admitting: Primary Care

## 2021-03-25 NOTE — Patient Instructions (Signed)
Chronic Back Pain When back pain lasts longer than 3 months, it is called chronic back pain. Pain may get worse at certain times (flare-ups). There are things you can do at home to manage your pain. Follow these instructions at home: Pay attention to any changes in your symptoms. Take these actions to help with your pain: Managing pain and stiffness   If told, put ice on the painful area. Your doctor may tell you to use ice for 24-48 hours after the flare-up starts. To do this: Put ice in a plastic bag. Place a towel between your skin and the bag. Leave the ice on for 20 minutes, 2-3 times a day. If told, put heat on the painful area. Do this as often as told by your doctor. Use the heat source that your doctor recommends, such as a moist heat pack or a heating pad. Place a towel between your skin and the heat source. Leave the heat on for 20-30 minutes. Take off the heat if your skin turns bright red. This is especially important if you are unable to feel pain, heat, or cold. You may have a greater risk of getting burned. Soak in a warm bath. This can help relieve pain. Activity  Avoid bending and other activities that make pain worse. When standing: Keep your upper back and neck straight. Keep your shoulders pulled back. Avoid slouching. When sitting: Keep your back straight. Relax your shoulders. Do not round your shoulders or pull them backward. Do not sit or stand in one place for long periods of time. Take short rest breaks during the day. Lying down or standing is usually better than sitting. Resting can help relieve pain. When sitting or lying down for a long time, do some mild activity or stretching. This will help to prevent stiffness and pain. Get regular exercise. Ask your doctor what activities are safe for you. Do not lift anything that is heavier than 10 lb (4.5 kg) or the limit that you are told, until your doctor says that it is safe. To prevent injury when you lift  things: Bend your knees. Keep the weight close to your body. Avoid twisting. Sleep on a firm mattress. Try lying on your side with your knees slightly bent. If you lie on your back, put a pillow under your knees. Medicines Treatment may include medicines for pain and swelling taken by mouth or put on the skin, prescription pain medicine, or muscle relaxants. Take over-the-counter and prescription medicines only as told by your doctor. Ask your doctor if the medicine prescribed to you: Requires you to avoid driving or using machinery. Can cause trouble pooping (constipation). You may need to take these actions to prevent or treat trouble pooping: Drink enough fluid to keep your pee (urine) pale yellow. Take over-the-counter or prescription medicines. Eat foods that are high in fiber. These include beans, whole grains, and fresh fruits and vegetables. Limit foods that are high in fat and sugars. These include fried or sweet foods. General instructions Do not use any products that contain nicotine or tobacco, such as cigarettes, e-cigarettes, and chewing tobacco. If you need help quitting, ask your doctor. Keep all follow-up visits as told by your doctor. This is important. Contact a doctor if: Your pain does not get better with rest or medicine. Your pain gets worse, or you have new pain. You have a high fever. You lose weight very quickly. You have trouble doing your normal activities. Get help right away if: One   or both of your legs or feet feel weak. One or both of your legs or feet lose feeling (have numbness). You have trouble controlling when you poop (have a bowel movement) or pee (urinate). You have bad back pain and: You feel like you may vomit (nauseous), or you vomit. You have pain in your belly (abdomen). You have shortness of breath. You faint. Summary When back pain lasts longer than 3 months, it is called chronic back pain. Pain may get worse at certain times  (flare-ups). Use ice and heat as told by your doctor. Your doctor may tell you to use ice after flare-ups. This information is not intended to replace advice given to you by your health care provider. Make sure you discuss any questions you have with your health care provider. Document Revised: 08/11/2019 Document Reviewed: 08/11/2019 Elsevier Patient Education  2022 Elsevier Inc.  

## 2021-03-26 ENCOUNTER — Other Ambulatory Visit: Payer: Self-pay

## 2021-03-28 ENCOUNTER — Other Ambulatory Visit: Payer: Self-pay

## 2021-04-02 ENCOUNTER — Other Ambulatory Visit: Payer: Self-pay

## 2021-04-02 ENCOUNTER — Ambulatory Visit: Payer: Self-pay

## 2021-04-04 ENCOUNTER — Other Ambulatory Visit: Payer: Self-pay

## 2021-04-05 ENCOUNTER — Other Ambulatory Visit: Payer: Self-pay

## 2021-04-06 ENCOUNTER — Other Ambulatory Visit: Payer: Self-pay

## 2021-04-06 ENCOUNTER — Telehealth (HOSPITAL_COMMUNITY): Payer: Self-pay | Admitting: Psychiatry

## 2021-04-10 ENCOUNTER — Other Ambulatory Visit: Payer: Self-pay

## 2021-04-10 MED ORDER — HYDROCODONE-ACETAMINOPHEN 5-325 MG PO TABS: ORAL_TABLET | ORAL | 0 refills | Status: DC

## 2021-04-13 ENCOUNTER — Other Ambulatory Visit: Payer: Self-pay | Admitting: Neurosurgery

## 2021-04-13 ENCOUNTER — Other Ambulatory Visit (HOSPITAL_COMMUNITY): Payer: Self-pay | Admitting: Neurosurgery

## 2021-04-13 DIAGNOSIS — M25511 Pain in right shoulder: Secondary | ICD-10-CM

## 2021-04-18 ENCOUNTER — Other Ambulatory Visit: Payer: Self-pay | Admitting: Internal Medicine

## 2021-04-18 ENCOUNTER — Other Ambulatory Visit: Payer: Self-pay

## 2021-04-18 MED ORDER — SUCRALFATE 1 G PO TABS
1.0000 g | ORAL_TABLET | Freq: Three times a day (TID) | ORAL | 0 refills | Status: DC
  Filled 2021-04-18: qty 120, 30d supply, fill #0

## 2021-04-18 MED ORDER — METHOCARBAMOL 500 MG PO TABS
ORAL_TABLET | ORAL | 0 refills | Status: DC
  Filled 2021-04-18: qty 42, 10d supply, fill #0

## 2021-04-18 NOTE — Telephone Encounter (Signed)
Requested medication (s) are due for refill today: yes  Requested medication (s) are on the active medication list: yes  Last refill: 02/26/21  #60  1 refill  Future visit scheduled  no  Notes to clinic: not delegated  Requested Prescriptions  Pending Prescriptions Disp Refills   methocarbamol (ROBAXIN) 500 MG tablet 60 tablet 1    Sig: Take 1 tablet (500 mg total) by mouth every 6 (six) hours as needed for muscle spasms.     Not Delegated - Analgesics:  Muscle Relaxants Failed - 04/18/2021 11:36 AM      Failed - This refill cannot be delegated      Passed - Valid encounter within last 6 months    Recent Outpatient Visits           3 weeks ago Chronic low back pain, unspecified back pain laterality, unspecified whether sciatica present   [place], Michelle P, NP   1 month ago COPD exacerbation [facility])   [place] Elsie Stain, MD   2 months ago Acute cystitis without hematuria   [place] Ladell Pier, MD   4 months ago Prediabetes   [place], Levada Dy M, [place]   10 months ago Prediabetes   [place] Ladell Pier, MD              Signed Prescriptions Disp Refills   sucralfate (CARAFATE) 1 g tablet 120 tablet 0    Sig: Take 1 tablet (1 g total) by mouth 4 (four) times daily -  with meals and at bedtime.     Gastroenterology: Antiacids Passed - 04/18/2021 11:36 AM      Passed - Valid encounter within last 12 months    Recent Outpatient Visits           3 weeks ago Chronic low back pain, unspecified back pain laterality, unspecified whether sciatica present   [place], Kennedy P, NP   1 month ago COPD exacerbation [facility])   [place] Elsie Stain, MD   2 months ago Acute cystitis without hematuria   [place], MD   4 months ago Prediabetes   [place], Levada Dy M, [place]   10 months ago Prediabetes   [place] Ladell Pier, MD

## 2021-04-20 ENCOUNTER — Ambulatory Visit (HOSPITAL_COMMUNITY)
Admission: RE | Admit: 2021-04-20 | Discharge: 2021-04-20 | Disposition: A | Discharge status: Home / Self Care | Payer: Self-pay | Source: Ambulatory Visit | Attending: Neurosurgery | Admitting: Neurosurgery

## 2021-04-20 ENCOUNTER — Other Ambulatory Visit: Payer: Self-pay

## 2021-04-20 DIAGNOSIS — M25511 Pain in right shoulder: Secondary | ICD-10-CM | POA: Insufficient documentation

## 2021-04-24 ENCOUNTER — Other Ambulatory Visit: Payer: Self-pay

## 2021-04-25 ENCOUNTER — Telehealth: Payer: Self-pay | Admitting: *Deleted

## 2021-04-25 DIAGNOSIS — M25511 Pain in right shoulder: Secondary | ICD-10-CM

## 2021-04-25 DIAGNOSIS — M75101 Unspecified rotator cuff tear or rupture of right shoulder, not specified as traumatic: Secondary | ICD-10-CM

## 2021-04-25 NOTE — Telephone Encounter (Signed)
Copied from [place] [id number]. Topic: Referral - Request for Referral >> Apr 16, 2021  9:33 AM Tessa Lerner A wrote: Has patient seen PCP for this complaint? Yes.    *If NO, is insurance requiring patient see PCP for this issue before PCP can refer them?  Referral for which specialty: Orthopedic Specialist / Surgeon   Preferred provider/office: Patient has no preference   Reason for referral: Patient is experiencing shoulder discomfort >> Apr 25, 2021  1:20 PM Yvette Rack wrote: Pt requests to speak with Dr. Durenda Age nurse regarding referral request to Orthopedic Specialist / Surgeon due to shoulder pain and discomfort

## 2021-04-25 NOTE — Telephone Encounter (Signed)
Copied from [place] [id number]. Topic: General - Other >> Apr 18, 2021  4:15 PM Tessa Lerner A wrote: Reason for CRM: Patient would like to be contacted by a member about their previously denied request for a methocarbamol (ROBAXIN) 500 MG tablet refill  Please contact further when available

## 2021-04-26 NOTE — Telephone Encounter (Signed)
Forwarding to [place].

## 2021-04-27 ENCOUNTER — Other Ambulatory Visit: Payer: Self-pay

## 2021-04-27 MED ORDER — HYDROCODONE-ACETAMINOPHEN 5-325 MG PO TABS: ORAL_TABLET | ORAL | 0 refills | Status: DC

## 2021-04-27 NOTE — Telephone Encounter (Signed)
Referral submitted to orthopedic for right shoulder pain.  MRI report of right shoulder reviewed.

## 2021-04-27 NOTE — Telephone Encounter (Signed)
Rx was sent on 04/18/21

## 2021-04-27 NOTE — Telephone Encounter (Signed)
Will forward to provider  

## 2021-04-27 NOTE — Addendum Note (Signed)
Addended by: Karle Plumber B on: 04/27/2021 01:03 PM   Modules accepted: Orders

## 2021-04-30 ENCOUNTER — Other Ambulatory Visit: Payer: Self-pay

## 2021-05-02 ENCOUNTER — Other Ambulatory Visit: Payer: Self-pay | Admitting: Critical Care Medicine

## 2021-05-02 ENCOUNTER — Other Ambulatory Visit: Payer: Self-pay

## 2021-05-02 ENCOUNTER — Other Ambulatory Visit: Payer: Self-pay | Admitting: Internal Medicine

## 2021-05-02 ENCOUNTER — Other Ambulatory Visit (HOSPITAL_COMMUNITY): Payer: Self-pay | Admitting: Psychiatry

## 2021-05-02 ENCOUNTER — Other Ambulatory Visit: Payer: Self-pay | Admitting: Family

## 2021-05-02 DIAGNOSIS — I25118 Atherosclerotic heart disease of native coronary artery with other forms of angina pectoris: Secondary | ICD-10-CM

## 2021-05-02 DIAGNOSIS — I10 Essential (primary) hypertension: Secondary | ICD-10-CM

## 2021-05-02 DIAGNOSIS — F3342 Major depressive disorder, recurrent, in full remission: Secondary | ICD-10-CM

## 2021-05-02 MED FILL — Nebivolol HCl Tab 10 MG (Base Equivalent): ORAL | 30 days supply | Qty: 30 | Fill #3 | Status: AC

## 2021-05-02 MED FILL — Amlodipine Besylate Tab 5 MG (Base Equivalent): ORAL | 30 days supply | Qty: 30 | Fill #5 | Status: AC

## 2021-05-02 MED FILL — Levothyroxine Sodium Tab 100 MCG: ORAL | 30 days supply | Qty: 30 | Fill #3 | Status: AC

## 2021-05-02 NOTE — Telephone Encounter (Signed)
Requested medications are due for refill today.  yes  Requested medications are on the active medications list.  yes  Last refill. 06/15/2020  Future visit scheduled.   no  Notes to clinic.  Pt last seen for HTN 06/15/2020. More than 6 months overdue for office visit.

## 2021-05-03 MED ORDER — LOSARTAN POTASSIUM 50 MG PO TABS
ORAL_TABLET | Freq: Every day | ORAL | 2 refills | Status: DC
  Filled 2021-05-03: qty 45, 30d supply, fill #0
  Filled 2021-06-05: qty 45, 30d supply, fill #1
  Filled 2021-07-05: qty 45, 30d supply, fill #2

## 2021-05-03 NOTE — Telephone Encounter (Signed)
Requested medication (s) are due for refill today:  Yes needs help with PASS  Requested medication (s) are on the active medication list:   Yes  Future visit scheduled:   No   Last ordered: 04/10/2020 #30, 5 refills  Returned because no protocol information assigned to this medication.  Pt requesting help with PASS.   Not familiar with this.   Requested Prescriptions  Pending Prescriptions Disp Refills   Fluticasone-Umeclidin-Vilant 100-62.5-25 MCG/ACT AEPB 60 each 4    Sig: Inhale 1 puff into the lungs daily.     There is no refill protocol information for this order

## 2021-05-04 ENCOUNTER — Other Ambulatory Visit: Payer: Self-pay

## 2021-05-04 MED ORDER — ATORVASTATIN CALCIUM 80 MG PO TABS
80.0000 mg | ORAL_TABLET | Freq: Every day | ORAL | 0 refills | Status: DC
  Filled 2021-05-04: qty 30, 30d supply, fill #0

## 2021-05-07 ENCOUNTER — Other Ambulatory Visit: Payer: Self-pay

## 2021-05-08 ENCOUNTER — Other Ambulatory Visit: Payer: Self-pay

## 2021-05-08 ENCOUNTER — Encounter: Payer: Self-pay | Admitting: Orthopaedic Surgery

## 2021-05-08 ENCOUNTER — Ambulatory Visit (INDEPENDENT_AMBULATORY_CARE_PROVIDER_SITE_OTHER): Payer: Self-pay | Admitting: Orthopaedic Surgery

## 2021-05-08 DIAGNOSIS — M7581 Other shoulder lesions, right shoulder: Secondary | ICD-10-CM | POA: Insufficient documentation

## 2021-05-08 DIAGNOSIS — M7501 Adhesive capsulitis of right shoulder: Secondary | ICD-10-CM | POA: Insufficient documentation

## 2021-05-08 MED ORDER — LIDOCAINE HCL 1 % IJ SOLN
0.5000 mL | INTRAMUSCULAR | Status: AC | PRN
  Administered 2021-05-08: .5 mL

## 2021-05-08 MED ORDER — METHYLPREDNISOLONE ACETATE 40 MG/ML IJ SUSP
40.0000 mg | INTRAMUSCULAR | Status: AC | PRN
  Administered 2021-05-08: 40 mg via INTRA_ARTICULAR

## 2021-05-08 MED ORDER — BUPIVACAINE HCL 0.25 % IJ SOLN
4.0000 mL | INTRAMUSCULAR | Status: AC | PRN
  Administered 2021-05-08: 4 mL via INTRA_ARTICULAR

## 2021-05-08 NOTE — Progress Notes (Signed)
Office Visit Note   Patient: [name] [name]           Date of Birth: [birth year]           MRN: [id number] Visit Date: 05/08/2021              Requested by: Ladell Pier, MD [address] [place],  [place] [address] PCP: Ladell Pier, MD   Assessment & Plan: Visit Diagnoses:  1. Rotator cuff tendinitis, right   2. Adhesive capsulitis of right shoulder     Plan: Subacromial injection performed reproduce portion of her pain.  We will set up for some physical therapy as an outpatient for range of motion on [employer] for her rotator cuff tendinopathy.  Office follow-up 6 weeks for recheck.  Follow-Up Instructions: Return in about 6 weeks (around 06/19/2021).   Orders:  No orders of the defined types were placed in this encounter.  No orders of the defined types were placed in this encounter.     Procedures: Large Joint Inj: R subacromial bursa on 05/08/2021 3:01 PM Indications: pain Details: 22 G 1.5 in needle  Arthrogram: No  Medications: 4 mL bupivacaine 0.25 %; 40 mg methylPREDNISolone acetate 40 MG/ML; 0.5 mL lidocaine 1 % Outcome: tolerated well, no immediate complications Procedure, treatment alternatives, risks and benefits explained, specific risks discussed. Consent was given by the patient. Immediately prior to procedure a time out was called to verify the correct patient, procedure, equipment, support staff and site/side marked as required. Patient was prepped and draped in the usual sterile fashion.      Clinical Data: No additional findings.   Subjective: Chief Complaint  Patient presents with   Right Shoulder - New Patient (Initial Visit)    HPI 65 year old female here with her daughter with 87-month history of right shoulder pain and 1 month history of using the sling that she brought for her grandchild.  She saw Dr. Newman Pies who told her cervical MRI did not appear to show the cause of her pain.  She did have some spondylitic  changes and some mild central narrowing at C6-7 and moderate right foraminal stenosis.  Some central disc protrusions at C4-5 and C5-6.  Patient also had an MRI scan of her shoulder ordered by Dr. Arnoldo Morale which showed rotator cuff tendinopathy supraspinatus with small partial-thickness bursal tear, mild tendinosis of the infraspinatus and subscapularis with intact teres.  Long head of biceps also showed tendinosis.  Patient not been able to reach up overhead for several months.  She has pain with rotation trying to reach behind her.  Patient lives alone.  Daughter is with her today and participates in her health care visits.   Review of Systems positive for prediabetes A1c 6.1.  Positive tobacco use with COPD GERD, bipolar disorder.  All the systems noncontributory to HPI.   Objective: Vital Signs: BP 132/80 (BP Location: Left Arm, Patient Position: Sitting, Cuff Size: Normal)   Pulse 60   Ht 5' 1.5" (1.562 m)   Wt 135 lb (61.2 kg)   SpO2 94%   BMI 25.09 kg/m   Physical Exam Constitutional:      Appearance: She is well-developed.  HENT:     Head: Normocephalic.     Right Ear: External ear normal.     Left Ear: External ear normal. There is no impacted cerumen.  Eyes:     Pupils: Pupils are equal, round, and reactive to light.  Neck:  Thyroid: No thyromegaly.     Trachea: No tracheal deviation.  Cardiovascular:     Rate and Rhythm: Normal rate.  Pulmonary:     Effort: Pulmonary effort is normal.  Abdominal:     Palpations: Abdomen is soft.  Musculoskeletal:     Cervical back: No rigidity.  Skin:    General: Skin is warm and dry.  Neurological:     Mental Status: She is alert and oriented to person, place, and time.  Psychiatric:        Behavior: Behavior normal.    Ortho Exam patient has positive Spurling on the right which is mild to moderate she does have brachial plexus tenderness both right and left.  Some discomfort with cervical rotation.  Abduction of the right  shoulder only 70 long of the biceps is exquisitely tender.  Pain with resisted subscap and shoulder external rotation.  Passively I can just get her elbow to shoulder height with progressive pain and resistance.  Specialty Comments:  No specialty comments available.  Imaging: No results found.   PMFS History: Patient Active Problem List   Diagnosis Date Noted   Rotator cuff tendinitis, right 05/08/2021   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   Chronic neck pain 02/26/2021   GAD (generalized anxiety disorder) 01/08/2021   Acute cystitis without hematuria 11/07/2020   Influenza vaccine needed 06/15/2020   Abdominal pain, epigastric 04/28/2019   Esophageal dysphagia 04/28/2019   Bipolar I disorder ([place]) 09/17/2018   Insomnia 10/28/2017   Prediabetes 05/28/2017   QT prolongation 03/25/2017   Encounter for screening mammogram for breast cancer 09/11/2015   Psoriasis 06/22/2015   COPD (chronic obstructive pulmonary disease) (HCC)GOLD C  05/18/2015   Anxiety and depression 07/10/2013   Essential hypertension, benign 06/07/2013   Dyslipidemia 06/07/2013   Asthma, chronic 06/07/2013   Emphysema lung ([place]) 06/07/2013   Chronic back pain 06/07/2013   CAD S/P percutaneous coronary angioplasty 06/07/2013   GERD (gastroesophageal reflux disease) 06/07/2013   Breast lump on left side at 3 o'clock position 10/13/2012   S/P abdominal hysterectomy and right salpingo-oophorectomy 08/29/2011   COPD exacerbation ([place]) 09/15/2009   TOBACCO ABUSE 07/17/2009   Chronic rhinitis 07/17/2009   Lung nodule < 6cm on CT 07/17/2009   ALLERGY, FOOD 07/17/2009   Hypothyroidism 12/22/2008   Past Medical History:  Diagnosis Date   Allergy    seasonal   Anxiety    takes Lexapro daily   Arthritis    "back, from neck down pass my bra area" (03/25/2017)   Asthma    Bartholin gland cyst 08/29/2011   Bruises easily    pt is on Effient   Chronic back pain    herniated nucleus pulposus    Chronic back pain    "neck to bra area; lower back" (03/25/2017)   Chronic kidney disease    recurrent UTI's this year 2022   COPD (chronic obstructive pulmonary disease) (HCC)    early stages   Coronary artery disease    Depression    takes Klonopin daily   Diabetes mellitus without complication (HCC)    Diverticulosis    GERD (gastroesophageal reflux disease)    takes Nexium daily   H/O hiatal hernia    Heart attack ([place]) 2011   Hemorrhoids    Hernia    Hyperlipidemia    takes Lipitor daily   Hypertension    takes Losartan daily and Labetalol bid   Hypothyroidism    takes Synthroid daily   Insomnia  hydroxyzine prn   Joint pain    Pneumonia    "couple times" (03/25/2017)   Pre-diabetes    "just found out 1 wk ago" (03/25/2017)   Psoriasis    elbows,knees,back   Shortness of breath    with exertion   Slowing of urinary stream    Stress incontinence     Family History  Problem Relation Age of Onset   Heart disease Mother    Hypertension Mother    Stroke Mother    Mental illness Mother    Heart disease Father    Hypertension Father    Diabetes Father    Colon polyps Brother    Breast cancer Maternal Aunt    Throat cancer Maternal Uncle    Thyroid disease Paternal Aunt    Heart disease Maternal Grandfather    Cancer Paternal Grandmother        mouth   Anesthesia problems Daughter    Hypotension Neg Hx    Malignant hyperthermia Neg Hx    Pseudochol deficiency Neg Hx    Colon cancer Neg Hx    Stomach cancer Neg Hx    Esophageal cancer Neg Hx    Pancreatic cancer Neg Hx    Rectal cancer Neg Hx     Past Surgical History:  Procedure Laterality Date   ABDOMINAL EXPLORATION SURGERY  1977   ABDOMINAL HYSTERECTOMY  1977   "left one of my ovaries"   BACK SURGERY     BIOPSY  05/27/2019   Procedure: BIOPSY;  Surgeon: Daneil Dolin, MD;  [facility];  Service: Endoscopy;;   COLONOSCOPY  2011   Dr. Ardis Hughs: Mild diverticulosis, descending diminutive  colon polyp (not retrieved), next colonoscopy 10 years   CORONARY ANGIOPLASTY WITH STENT PLACEMENT  2011 X2   "regular stents didn't work; had to go back in in ~ 1 month and put in medicated stents"   DILATION AND CURETTAGE OF UTERUS     ESOPHAGOGASTRODUODENOSCOPY     ESOPHAGOGASTRODUODENOSCOPY (EGD) WITH PROPOFOL N/A 05/27/2019   normal esophagus, dilation, erosive gastropathy s/p biopsy, normal duodenum. Negative H.pylori.    LEFT HEART CATH AND CORONARY ANGIOGRAPHY N/A 03/26/2017   Procedure: LEFT HEART CATH AND CORONARY ANGIOGRAPHY;  Surgeon: Belva Crome, MD;  Location: [facility];  Service: Cardiovascular;  Laterality: N/A;   LUMBAR LAMINECTOMY/DECOMPRESSION MICRODISCECTOMY  08/05/2011   Procedure: LUMBAR LAMINECTOMY/DECOMPRESSION MICRODISCECTOMY;  Surgeon: Otilio Connors, MD;  Location: [facility];  Service: Neurosurgery;  Laterality: Right;  Right Lumbar four-five extraforaminal discectomy   MALONEY DILATION N/A 05/27/2019   Procedure: Venia Minks DILATION;  Surgeon: Daneil Dolin, MD;  Location: [facility];  Service: Endoscopy;  Laterality: N/A;  36   TONSILLECTOMY     as a child   UPPER GASTROINTESTINAL ENDOSCOPY     Social History   Occupational History   Not on file  Tobacco Use   Smoking status: Every Day    Packs/day: 1.00    Years: 50.00    Pack years: 50.00    Types: Cigarettes   Smokeless tobacco: Never  Vaping Use   Vaping Use: Never used  Substance and Sexual Activity   Alcohol use: Yes    Comment: once a week   Drug use: No    Types: Cocaine    Comment: has been years per pt   Sexual activity: Not Currently    Birth control/protection: Surgical

## 2021-05-08 NOTE — Addendum Note (Signed)
Addended by: Melton Alar on: 05/08/2021 04:33 PM   Modules accepted: Orders

## 2021-05-09 ENCOUNTER — Other Ambulatory Visit: Payer: Self-pay

## 2021-05-09 MED ORDER — TRELEGY ELLIPTA 100-62.5-25 MCG/ACT IN AEPB
INHALATION_SPRAY | RESPIRATORY_TRACT | 2 refills | Status: DC
  Filled 2021-07-04 – 2021-07-26 (×2): qty 60, 30d supply, fill #0
  Filled 2021-08-21: qty 60, 30d supply, fill #1

## 2021-05-10 ENCOUNTER — Other Ambulatory Visit: Payer: Self-pay

## 2021-05-14 ENCOUNTER — Other Ambulatory Visit (HOSPITAL_COMMUNITY): Payer: Self-pay | Admitting: Psychiatry

## 2021-05-14 ENCOUNTER — Other Ambulatory Visit: Payer: Self-pay

## 2021-05-15 ENCOUNTER — Other Ambulatory Visit: Payer: Self-pay

## 2021-05-17 ENCOUNTER — Other Ambulatory Visit: Payer: Self-pay

## 2021-05-18 ENCOUNTER — Other Ambulatory Visit (HOSPITAL_COMMUNITY): Payer: Self-pay | Admitting: Psychiatry

## 2021-05-18 ENCOUNTER — Other Ambulatory Visit: Payer: Self-pay | Admitting: Internal Medicine

## 2021-05-18 ENCOUNTER — Other Ambulatory Visit: Payer: Self-pay

## 2021-05-18 DIAGNOSIS — F3342 Major depressive disorder, recurrent, in full remission: Secondary | ICD-10-CM

## 2021-05-18 NOTE — Telephone Encounter (Signed)
Requested medication (s) are due for refill today - yes  Requested medication (s) are on the active medication list -yes  Future visit scheduled -no  Last refill: 04/18/21 #42  Notes to clinic: Request RF: non delegated Rx  Requested Prescriptions  Pending Prescriptions Disp Refills   methocarbamol (ROBAXIN) 500 MG tablet 42 tablet 0    Sig: Take 1 tablet by mouth 4 times daily     Not Delegated - Analgesics:  Muscle Relaxants Failed - 05/18/2021 10:22 AM      Failed - This refill cannot be delegated      Passed - Valid encounter within last 6 months    Recent Outpatient Visits           1 month ago Chronic low back pain, unspecified back pain laterality, unspecified whether sciatica present   [place], Michelle P, NP   2 months ago COPD exacerbation [facility])   [place] Elsie Stain, MD   3 months ago Acute cystitis without hematuria   [place], Deborah B, MD   5 months ago Prediabetes   [place], Levada Dy M, [place]   11 months ago Prediabetes   [place], MD                 Requested Prescriptions  Pending Prescriptions Disp Refills   methocarbamol (ROBAXIN) 500 MG tablet 42 tablet 0    Sig: Take 1 tablet by mouth 4 times daily     Not Delegated - Analgesics:  Muscle Relaxants Failed - 05/18/2021 10:22 AM      Failed - This refill cannot be delegated      Passed - Valid encounter within last 6 months    Recent Outpatient Visits           1 month ago Chronic low back pain, unspecified back pain laterality, unspecified whether sciatica present   [place], Michelle P, NP   2 months ago COPD exacerbation [facility])   [place], MD   3 months ago Acute cystitis without hematuria   [place], MD   5 months ago Prediabetes   [place] [facility], Levada Dy M, [place]   11 months ago Prediabetes   [place] Ladell Pier, MD

## 2021-05-21 ENCOUNTER — Other Ambulatory Visit: Payer: Self-pay

## 2021-05-21 ENCOUNTER — Other Ambulatory Visit (HOSPITAL_COMMUNITY): Payer: Self-pay

## 2021-05-21 MED ORDER — METHOCARBAMOL 500 MG PO TABS
ORAL_TABLET | ORAL | 0 refills | Status: DC
Start: 2021-05-21 — End: 2021-07-04
  Filled 2021-05-21: qty 42, 10d supply, fill #0

## 2021-05-25 ENCOUNTER — Other Ambulatory Visit: Payer: Self-pay

## 2021-05-30 ENCOUNTER — Telehealth (HOSPITAL_COMMUNITY): Payer: Self-pay | Admitting: *Deleted

## 2021-05-30 NOTE — Telephone Encounter (Signed)
Call from patient seeking rx for her meds. Reveiwed record, she has not been seen here since June and she was told we would not give her rx for any meds till she is seen.Transferred her to the front desk to make a new appt and or discuss walk ins to continue with meds.

## 2021-05-31 ENCOUNTER — Other Ambulatory Visit: Payer: Self-pay

## 2021-05-31 ENCOUNTER — Encounter (HOSPITAL_COMMUNITY): Payer: Self-pay | Admitting: Physician Assistant

## 2021-05-31 ENCOUNTER — Ambulatory Visit (INDEPENDENT_AMBULATORY_CARE_PROVIDER_SITE_OTHER): Payer: No Payment, Other | Admitting: Physician Assistant

## 2021-05-31 VITALS — BP 129/86 | HR 60 | Ht 61.5 in | Wt 136.0 lb

## 2021-05-31 DIAGNOSIS — F3342 Major depressive disorder, recurrent, in full remission: Secondary | ICD-10-CM | POA: Diagnosis not present

## 2021-05-31 DIAGNOSIS — F411 Generalized anxiety disorder: Secondary | ICD-10-CM | POA: Diagnosis not present

## 2021-05-31 DIAGNOSIS — G4700 Insomnia, unspecified: Secondary | ICD-10-CM

## 2021-05-31 MED ORDER — ESCITALOPRAM OXALATE 10 MG PO TABS
ORAL_TABLET | ORAL | 2 refills | Status: DC
  Filled 2021-05-31 (×2): qty 90, 30d supply, fill #0

## 2021-05-31 MED ORDER — DOXEPIN HCL 50 MG PO CAPS
ORAL_CAPSULE | ORAL | 2 refills | Status: DC
  Filled 2021-05-31: qty 30, 30d supply, fill #0
  Filled 2021-07-12: qty 30, 30d supply, fill #1
  Filled 2021-08-20: qty 30, 30d supply, fill #0

## 2021-05-31 MED ORDER — MIRTAZAPINE 30 MG PO TABS
ORAL_TABLET | ORAL | 2 refills | Status: DC
  Filled 2021-05-31: qty 30, 30d supply, fill #0
  Filled 2021-07-04 (×2): qty 30, 30d supply, fill #1
  Filled 2021-08-20: qty 30, 30d supply, fill #0

## 2021-05-31 MED ORDER — BUSPIRONE HCL 15 MG PO TABS
ORAL_TABLET | ORAL | 2 refills | Status: DC
  Filled 2021-05-31: qty 120, fill #0
  Filled 2021-07-04 – 2021-08-29 (×2): qty 120, 30d supply, fill #0

## 2021-05-31 NOTE — Progress Notes (Addendum)
[place] MD/PA/NP OP Progress Note  05/31/2021 3:58 PM [name] [name]  MRN:  [id number]  Chief Complaint:  Chief Complaint   Medication Management    HPI:   [name] [name] is a 65 year old female with a past psychiatric history significant for insomnia, major depressive disorder, and generalized anxiety disorder who presents to [facility] for follow-up and medication management.  Patient is currently being managed for her depression and anxiety and was last seen by Dr. Toy Care on 01/08/2021.  Patient is currently being managed on the following medications:  Doxepin (Sinequan) 50 mg at bedtime Buspirone 15 mg 2 tablets in the morning/1 tablet at noon/1 tablet in the evening Escitalopram 30 mg daily Clonazepam 0.5 mg 3 times daily as needed  Patient reports that her current regimen of medications are working well.  Patient is currently out of her sleep aid medications and states that she has been having issues with sleep for the past 2 nights.  Patient also endorses the following stressors: adjusting to recent move, torn rotator cuff, and being weeks without a primary care provider.  Patient states that her anxiety has also been running high.  She rates her anxiety at a 10 out of 10 and attributes her anxiety to the multiple stressors in her life.  Patient endorses the following depressive symptoms: self isolation, irritability, decreased concentration, and feelings of sadness.  Although patient has been experiencing depression, she notes that she has been experiencing instances of high levels of energy accompanied by restlessness.  Patient feels that she has OCD over every little thing.  A PHQ-9 screen was performed with the patient scoring a 17.  A GAD-7 screen was also performed with the patient scoring a 21.  Patient is alert and oriented x4, pleasant, calm, cooperative, and fully engaged in conversation during the encounter.  Patient endorses good mood but  states that she has been more anxious as of late.  Patient denies suicidal or homicidal ideations.  She further denies auditory or visual hallucinations and does not appear to be responding to internal/external stimuli.  Patient endorses fair sleep stating that she wakes up at least once a night.  Patient endorses fair appetite and eats on average 2 meals per day.  Patient endorses alcohol consumption sparingly.  Patient endorses tobacco use and smokes on average a pack per day.  Patient denies illicit drug use.  Visit Diagnosis:    ICD-10-CM   1. Insomnia, unspecified type  G47.00 mirtazapine (REMERON) 30 MG tablet    2. Major depressive disorder, recurrent, in full remission with anxious distress (HCC)  F33.42 busPIRone (BUSPAR) 15 MG tablet    doxepin (SINEQUAN) 50 MG capsule    escitalopram (LEXAPRO) 10 MG tablet    3. GAD (generalized anxiety disorder)  F41.1 busPIRone (BUSPAR) 15 MG tablet    escitalopram (LEXAPRO) 10 MG tablet      Past Psychiatric History:  Insomnia Major depressive disorder Generalized anxiety disorder  Past Medical History:  Past Medical History:  Diagnosis Date   Allergy    seasonal   Anxiety    takes Lexapro daily   Arthritis    "back, from neck down pass my bra area" (03/25/2017)   Asthma    Bartholin gland cyst 08/29/2011   Bruises easily    pt is on Effient   Chronic back pain    herniated nucleus pulposus   Chronic back pain    "neck to bra area; lower back" (03/25/2017)  Chronic kidney disease    recurrent UTI's this year 2022   COPD (chronic obstructive pulmonary disease) (HCC)    early stages   Coronary artery disease    Depression    takes Klonopin daily   Diabetes mellitus without complication (HCC)    Diverticulosis    GERD (gastroesophageal reflux disease)    takes Nexium daily   H/O hiatal hernia    Heart attack ([place]) 2011   Hemorrhoids    Hernia    Hyperlipidemia    takes Lipitor daily   Hypertension    takes Losartan  daily and Labetalol bid   Hypothyroidism    takes Synthroid daily   Insomnia    hydroxyzine prn   Joint pain    Pneumonia    "couple times" (03/25/2017)   Pre-diabetes    "just found out 1 wk ago" (03/25/2017)   Psoriasis    elbows,knees,back   Shortness of breath    with exertion   Slowing of urinary stream    Stress incontinence     Past Surgical History:  Procedure Laterality Date   [place]   "left one of my ovaries"   BACK SURGERY     BIOPSY  05/27/2019   Procedure: BIOPSY;  Surgeon: Daneil Dolin, MD;  [facility];  Service: Endoscopy;;   COLONOSCOPY  2011   Dr. Ardis Hughs: Mild diverticulosis, descending diminutive colon polyp (not retrieved), next colonoscopy 10 years   CORONARY ANGIOPLASTY WITH STENT PLACEMENT  2011 X2   "regular stents didn't work; had to go back in in ~ 1 month and put in medicated stents"   DILATION AND CURETTAGE OF UTERUS     ESOPHAGOGASTRODUODENOSCOPY     ESOPHAGOGASTRODUODENOSCOPY (EGD) WITH PROPOFOL N/A 05/27/2019   normal esophagus, dilation, erosive gastropathy s/p biopsy, normal duodenum. Negative H.pylori.    LEFT HEART CATH AND CORONARY ANGIOGRAPHY N/A 03/26/2017   Procedure: LEFT HEART CATH AND CORONARY ANGIOGRAPHY;  Surgeon: Belva Crome, MD;  Location: [facility];  Service: Cardiovascular;  Laterality: N/A;   LUMBAR LAMINECTOMY/DECOMPRESSION MICRODISCECTOMY  08/05/2011   Procedure: LUMBAR LAMINECTOMY/DECOMPRESSION MICRODISCECTOMY;  Surgeon: Otilio Connors, MD;  Location: [place] [facility];  Service: Neurosurgery;  Laterality: Right;  Right Lumbar four-five extraforaminal discectomy   MALONEY DILATION N/A 05/27/2019   Procedure: Venia Minks DILATION;  Surgeon: Daneil Dolin, MD;  [facility];  Service: Endoscopy;  Laterality: N/A;  72   TONSILLECTOMY     as a child   UPPER GASTROINTESTINAL ENDOSCOPY      Family Psychiatric History:  See intake H&P for  full details. Reviewed, with no updates at this time.  Family History:  Family History  Problem Relation Age of Onset   Heart disease Mother    Hypertension Mother    Stroke Mother    Mental illness Mother    Heart disease Father    Hypertension Father    Diabetes Father    Colon polyps Brother    Breast cancer Maternal Aunt    Throat cancer Maternal Uncle    Thyroid disease Paternal Aunt    Heart disease Maternal Grandfather    Cancer Paternal Grandmother        mouth   Anesthesia problems Daughter    Hypotension Neg Hx    Malignant hyperthermia Neg Hx    Pseudochol deficiency Neg Hx    Colon cancer Neg Hx    Stomach cancer Neg  Hx    Esophageal cancer Neg Hx    Pancreatic cancer Neg Hx    Rectal cancer Neg Hx     Social History:  Social History   Socioeconomic History   Marital status: Divorced    Spouse name: Not on file   Number of children: Not on file   Years of education: Not on file   Highest education level: Not on file  Occupational History   Not on file  Tobacco Use   Smoking status: Every Day    Packs/day: 1.00    Years: 50.00    Pack years: 50.00    Types: Cigarettes   Smokeless tobacco: Never  Vaping Use   Vaping Use: Never used  Substance and Sexual Activity   Alcohol use: Yes    Comment: once a week   Drug use: No    Types: Cocaine    Comment: has been years per pt   Sexual activity: Not Currently    Birth control/protection: Surgical  Other Topics Concern   Not on file  Social History Narrative   Not on file   Social Determinants of Health   Financial Resource Strain: Not on file  Food Insecurity: Not on file  Transportation Needs: Not on file  Physical Activity: Not on file  Stress: Not on file  Social Connections: Not on file    Allergies:  Allergies  Allergen Reactions   Avocado Anaphylaxis   Latex Shortness Of Breath and Rash   Codeine Nausea Only    Metabolic Disorder Labs: Lab Results  Component Value Date    HGBA1C 6.0 (H) 11/24/2020   No results found for: PROLACTIN Lab Results  Component Value Date   CHOL 147 11/24/2020   TRIG 109 11/24/2020   HDL 58 11/24/2020   CHOLHDL 2.5 11/24/2020   VLDL 15 04/16/2016   LDLCALC 69 11/24/2020   LDLCALC 118 (H) 06/15/2020   Lab Results  Component Value Date   TSH 6.900 (H) 11/24/2020   TSH 5.880 (H) 06/15/2020    Therapeutic Level Labs: No results found for: LITHIUM No results found for: VALPROATE No components found for:  CBMZ  Current Medications: Current Outpatient Medications  Medication Sig Dispense Refill   albuterol (VENTOLIN HFA) 108 (90 Base) MCG/ACT inhaler INHALE 2 PUFFS INTO THE LUNGS EVERY 6 (SIX) HOURS AS NEEDED FOR WHEEZING OR SHORTNESS OF BREATH. 18 g 2   amLODipine (NORVASC) 5 MG tablet TAKE 1 TABLET (5 MG TOTAL) BY MOUTH DAILY. 90 tablet 3   aspirin EC 81 MG tablet Take 81 mg by mouth daily.     atorvastatin (LIPITOR) 80 MG tablet Take 1 tablet (80 mg total) by mouth daily. please schedule appointment for more refills 30 tablet 0   Blood Glucose Monitoring Suppl (TRUE METRIX METER) DEVI 1 kit by Does not apply route 4 (four) times daily. 1 Device 0   busPIRone (BUSPAR) 15 MG tablet TAKE 2 TABLETS IN THE MORNING, 1 TAB AT NOON AND 1 TAB IN THE EVENING 120 tablet 2   cetirizine (ZYRTEC) 10 MG tablet Take 10 mg by mouth as needed.     clonazePAM (KLONOPIN) 0.5 MG tablet Take 1 tablet (0.5 mg total) by mouth 2 (two) times daily as needed for anxiety. 60 tablet 2   doxepin (SINEQUAN) 50 MG capsule TAKE 1 CAPSULE (50 MG TOTAL) BY MOUTH AT BEDTIME. 30 capsule 2   doxycycline (VIBRA-TABS) 100 MG tablet Take 1 tablet (100 mg total) by mouth 2 (two) times  daily. 14 tablet 0   escitalopram (LEXAPRO) 10 MG tablet TAKE 3 TABLETS (30 MG TOTAL) BY MOUTH DAILY. 90 tablet 2   Fluticasone-Umeclidin-Vilant (TRELEGY ELLIPTA) 100-62.5-25 MCG/ACT AEPB inhale 1 puff into the lungs daily 60 each 2   furosemide (LASIX) 20 MG tablet Take 1 tablet (20  mg total) by mouth daily. 30 tablet 6   gabapentin (NEURONTIN) 600 MG tablet TAKE 1 TABLET BY MOUTH 4 TIMES DAILY 360 tablet 1   glucose blood (TRUE METRIX BLOOD GLUCOSE TEST) test strip Use as instructed 100 each 12   HYDROcodone-acetaminophen (NORCO/VICODIN) 5-325 MG tablet take 1 tablet by oral route  every 6 hours as needed for pain 28 tablet 0   ipratropium-albuterol (DUONEB) 0.5-2.5 (3) MG/3ML SOLN TAKE 3 MLS VIA NEBULIZATION EVERY 6 HOURS (Patient taking differently: TAKE 3 MLS VIA NEBULIZATION EVERY 6 HOURS as needed) 360 mL 1   levothyroxine (SYNTHROID) 100 MCG tablet TAKE 1 TABLET (100 MCG TOTAL) BY MOUTH DAILY. 30 tablet 6   levothyroxine (SYNTHROID) 25 MCG tablet Take 1 tablet (25 mcg total) by mouth daily. In addition to 12mg tablet daily 90 tablet 3   losartan (COZAAR) 50 MG tablet TAKE 1 & 1/2 TABLETS BY MOUTH DAILY. 45 tablet 2   metFORMIN (GLUCOPHAGE) 500 MG tablet TAKE 1 TABLET (500 MG TOTAL) BY MOUTH 2 (TWO) TIMES DAILY WITH A MEAL. 60 tablet 3   methocarbamol (ROBAXIN) 500 MG tablet Take 1 tablet (500 mg total) by mouth every 6 (six) hours as needed for muscle spasms. 60 tablet 1   methocarbamol (ROBAXIN) 500 MG tablet Take 1 tablet by mouth 4 times daily 42 tablet 0   mirtazapine (REMERON) 30 MG tablet TAKE 1 TABLET (30 MG TOTAL) BY MOUTH AT BEDTIME. 30 tablet 2   montelukast (SINGULAIR) 10 MG tablet TAKE 1 TABLET BY MOUTH AT BEDTIME. 30 tablet 2   nebivolol (BYSTOLIC) 10 MG tablet TAKE 1 TABLET (10 MG TOTAL) BY MOUTH DAILY. 90 tablet 4   nitroGLYCERIN (NITROSTAT) 0.4 MG SL tablet Place 1 tablet (0.4 mg total) under the tongue every 5 (five) minutes as needed for chest pain. 90 tablet 3   omeprazole (PRILOSEC) 40 MG capsule TAKE 1 CAPSULE (40 MG TOTAL) BY MOUTH 2 (TWO) TIMES DAILY BEFORE A MEAL. 120 capsule 0   predniSONE (DELTASONE) 20 MG tablet 3 Tabs PO Days 1-3, then 2 tabs PO Days 4-6, then 1 tab PO Day 7-9, then Half Tab PO Day 10-12 20 tablet 0    promethazine-dextromethorphan (PROMETHAZINE-DM) 6.25-15 MG/5ML syrup Take 5 mLs by mouth 4 (four) times daily as needed for cough. 180 mL 0   sucralfate (CARAFATE) 1 g tablet Take 1 tablet (1 g total) by mouth 4 (four) times daily -  with meals and at bedtime. 120 tablet 0   ticagrelor (BRILINTA) 60 MG TABS tablet TAKE 1 TABLET (60 MG TOTAL) BY MOUTH 2 (TWO) TIMES DAILY. 60 tablet 5   traMADol (ULTRAM) 50 MG tablet Take 1 tablet (50 mg total) by mouth every 6 (six) hours as needed. 10 tablet 0   triamcinolone cream (KENALOG) 0.1 % APPLY 1 APPLICATION TOPICALLY 2 TIMES DAILY. 30 g 1   TRUEPLUS LANCETS 28G MISC 28 g by Does not apply route QID. 120 each 2   Vitamin E 180 MG CAPS Take 180 mg by mouth daily.     No current facility-administered medications for this visit.     Musculoskeletal: Strength & Muscle Tone: within normal limits Gait & Station:  normal Patient leans: N/A  Psychiatric Specialty Exam: Review of Systems  Psychiatric/Behavioral:  Positive for sleep disturbance. Negative for decreased concentration, dysphoric mood, hallucinations, self-injury and suicidal ideas. The patient is nervous/anxious. The patient is not hyperactive.    Blood pressure 129/86, pulse 60, height 5' 1.5" (1.562 m), weight 136 lb (61.7 kg), SpO2 97 %.Body mass index is 25.28 kg/m.  General Appearance: Well Groomed  Eye Contact:  Good  Speech:  Clear and Coherent and Normal Rate  Volume:  Normal  Mood:  Anxious and Depressed  Affect:  Congruent  Thought Process:  Coherent, Goal Directed, and Descriptions of Associations: Intact  Orientation:  Full (Time, Place, and Person)  Thought Content: WDL   Suicidal Thoughts:  No  Homicidal Thoughts:  No  Memory:  Immediate;   Good Recent;   Good Remote;   Good  Judgement:  Fair  Insight:  Fair  Psychomotor Activity:  Normal  Concentration:  Concentration: Good and Attention Span: Fair  Recall:  [employer] of Knowledge: Fair  Language: Fair   Akathisia:  Negative  Handed:  Right  AIMS (if indicated): not done  Assets:  Communication Skills Desire for Improvement Housing  ADL's:  Intact  Cognition: WNL  Sleep:  Fair   Screenings: AUDIT    Flowsheet Row Admission (Discharged) from 07/10/2013 in [facility]  Alcohol Use Disorder Identification Test Final Score (AUDIT) 12      GAD-7    Flowsheet Row Office Visit from 05/31/2021 in [facility] Office Visit from 11/07/2020 in [place] [facility] Office Visit from 06/15/2020 in [place] from 01/20/2020 in [place] Office Visit from 11/18/2019 in [place]  Total GAD-7 Score '21 4 14 ' 0 7      PHQ2-9    [place] Office Visit from 05/31/2021 in [facility] Office Visit from 12/06/2020 in [facility] Visit from 11/07/2020 in [place] [facility] Office Visit from 06/15/2020 in [place] from 01/20/2020 in [place]  PHQ-2 Total Score 3 0 1 3 0  PHQ-9 Total Score 17 0 7 9 --      [place] Office Visit from 05/31/2021 in [facility] ED from 03/17/2021 in [place] DEPT ED from 02/10/2021 in [place] No Risk No Risk No Risk        Assessment and Plan:   [name] [name] is a 65 year old female with a past psychiatric history significant for insomnia, major depressive disorder, and generalized anxiety disorder who presents to [facility] for follow-up and medication management.  Patient presents today requesting refills on her medications due to recently running out.  Patient states that her current regimen of medications have been  helpful in managing her depression and anxiety.  Patient is requesting refills on all her medications following the conclusion of the encounter.  Patient's medications to be e-prescribed to pharmacy of choice.  1. Major depressive disorder, recurrent, in full remission with anxious distress (HCC)  - busPIRone (BUSPAR) 15 MG tablet; TAKE 2 TABLETS IN THE MORNING, 1 TAB AT NOON AND 1 TAB IN THE EVENING  Dispense: 120 tablet; Refill: 2 - doxepin (SINEQUAN) 50 MG capsule; TAKE 1 CAPSULE (50 MG TOTAL)  BY MOUTH AT BEDTIME.  Dispense: 30 capsule; Refill: 2 - escitalopram (LEXAPRO) 10 MG tablet; TAKE 3 TABLETS (30 MG TOTAL) BY MOUTH DAILY.  Dispense: 90 tablet; Refill: 2  2. GAD (generalized anxiety disorder)  - busPIRone (BUSPAR) 15 MG tablet; TAKE 2 TABLETS IN THE MORNING, 1 TAB AT NOON AND 1 TAB IN THE EVENING  Dispense: 120 tablet; Refill: 2 - escitalopram (LEXAPRO) 10 MG tablet; TAKE 3 TABLETS (30 MG TOTAL) BY MOUTH DAILY.  Dispense: 90 tablet; Refill: 2  3. Insomnia, unspecified type  - mirtazapine (REMERON) 30 MG tablet; TAKE 1 TABLET (30 MG TOTAL) BY MOUTH AT BEDTIME.  Dispense: 30 tablet; Refill: 2  Patient to follow up in 3 months Provider spent a total 20 minutes with the patient/reviewing patient's chart  Malachy Mood, PA 05/31/2021, 3:58 PM

## 2021-06-03 ENCOUNTER — Encounter (HOSPITAL_COMMUNITY): Payer: Self-pay | Admitting: Physician Assistant

## 2021-06-04 ENCOUNTER — Other Ambulatory Visit: Payer: Self-pay

## 2021-06-05 ENCOUNTER — Other Ambulatory Visit: Payer: Self-pay

## 2021-06-05 ENCOUNTER — Other Ambulatory Visit: Payer: Self-pay | Admitting: Internal Medicine

## 2021-06-05 DIAGNOSIS — I25118 Atherosclerotic heart disease of native coronary artery with other forms of angina pectoris: Secondary | ICD-10-CM

## 2021-06-05 MED FILL — Amlodipine Besylate Tab 5 MG (Base Equivalent): ORAL | 30 days supply | Qty: 30 | Fill #6 | Status: AC

## 2021-06-05 MED FILL — Nebivolol HCl Tab 10 MG (Base Equivalent): ORAL | 30 days supply | Qty: 30 | Fill #4 | Status: AC

## 2021-06-05 MED FILL — Gabapentin Tab 600 MG: ORAL | 30 days supply | Qty: 120 | Fill #3 | Status: AC

## 2021-06-06 ENCOUNTER — Ambulatory Visit: Payer: Medicaid Other | Admitting: Rehabilitative and Restorative Service Providers"

## 2021-06-06 ENCOUNTER — Other Ambulatory Visit: Payer: Self-pay

## 2021-06-10 ENCOUNTER — Other Ambulatory Visit: Payer: Self-pay | Admitting: Family

## 2021-06-10 ENCOUNTER — Other Ambulatory Visit: Payer: Self-pay | Admitting: Internal Medicine

## 2021-06-10 DIAGNOSIS — E039 Hypothyroidism, unspecified: Secondary | ICD-10-CM

## 2021-06-10 DIAGNOSIS — K219 Gastro-esophageal reflux disease without esophagitis: Secondary | ICD-10-CM

## 2021-06-10 DIAGNOSIS — I25118 Atherosclerotic heart disease of native coronary artery with other forms of angina pectoris: Secondary | ICD-10-CM

## 2021-06-11 ENCOUNTER — Other Ambulatory Visit: Payer: Self-pay

## 2021-06-11 MED ORDER — OMEPRAZOLE 40 MG PO CPDR
DELAYED_RELEASE_CAPSULE | ORAL | 0 refills | Status: DC
  Filled 2021-06-11 – 2021-07-26 (×2): qty 60, 30d supply, fill #0

## 2021-06-11 MED ORDER — LEVOTHYROXINE SODIUM 100 MCG PO TABS
ORAL_TABLET | Freq: Every day | ORAL | 6 refills | Status: DC
  Filled 2021-06-11 – 2021-07-26 (×2): qty 30, 30d supply, fill #0
  Filled 2021-08-29: qty 30, 30d supply, fill #1

## 2021-06-11 NOTE — Telephone Encounter (Signed)
Will forward to provider  

## 2021-06-13 ENCOUNTER — Other Ambulatory Visit: Payer: Self-pay

## 2021-06-15 ENCOUNTER — Other Ambulatory Visit: Payer: Self-pay

## 2021-06-28 ENCOUNTER — Ambulatory Visit (HOSPITAL_COMMUNITY): Payer: No Payment, Other | Admitting: Licensed Clinical Social Worker

## 2021-06-28 ENCOUNTER — Encounter (HOSPITAL_COMMUNITY): Payer: Self-pay

## 2021-07-02 NOTE — Progress Notes (Signed)
This encounter was created in error - please disregard.

## 2021-07-03 ENCOUNTER — Ambulatory Visit (HOSPITAL_COMMUNITY): Payer: Medicare Other | Admitting: Licensed Clinical Social Worker

## 2021-07-03 ENCOUNTER — Telehealth (HOSPITAL_COMMUNITY): Payer: Self-pay | Admitting: Licensed Clinical Social Worker

## 2021-07-03 NOTE — Telephone Encounter (Signed)
LCSW sent text message with link for video session per schedule. LCSW remained available on line for session until 1:14pm. Pt failed to sign on for session.

## 2021-07-04 ENCOUNTER — Other Ambulatory Visit: Payer: Self-pay | Admitting: Internal Medicine

## 2021-07-04 ENCOUNTER — Emergency Department (HOSPITAL_COMMUNITY)
Admission: EM | Admit: 2021-07-04 | Discharge: 2021-07-04 | Disposition: A | Discharge status: Home / Self Care | Payer: Medicare Other | Attending: Emergency Medicine | Admitting: Emergency Medicine

## 2021-07-04 ENCOUNTER — Encounter (HOSPITAL_COMMUNITY): Payer: Self-pay

## 2021-07-04 ENCOUNTER — Other Ambulatory Visit: Payer: Self-pay | Admitting: Critical Care Medicine

## 2021-07-04 ENCOUNTER — Other Ambulatory Visit: Payer: Self-pay

## 2021-07-04 ENCOUNTER — Ambulatory Visit: Payer: Self-pay | Admitting: *Deleted

## 2021-07-04 ENCOUNTER — Emergency Department (HOSPITAL_COMMUNITY): Payer: Medicare Other

## 2021-07-04 DIAGNOSIS — I10 Essential (primary) hypertension: Secondary | ICD-10-CM

## 2021-07-04 DIAGNOSIS — E039 Hypothyroidism, unspecified: Secondary | ICD-10-CM | POA: Insufficient documentation

## 2021-07-04 DIAGNOSIS — E119 Type 2 diabetes mellitus without complications: Secondary | ICD-10-CM | POA: Insufficient documentation

## 2021-07-04 DIAGNOSIS — Z7951 Long term (current) use of inhaled steroids: Secondary | ICD-10-CM | POA: Insufficient documentation

## 2021-07-04 DIAGNOSIS — I2510 Atherosclerotic heart disease of native coronary artery without angina pectoris: Secondary | ICD-10-CM | POA: Insufficient documentation

## 2021-07-04 DIAGNOSIS — Z79899 Other long term (current) drug therapy: Secondary | ICD-10-CM | POA: Diagnosis not present

## 2021-07-04 DIAGNOSIS — Z7984 Long term (current) use of oral hypoglycemic drugs: Secondary | ICD-10-CM | POA: Diagnosis not present

## 2021-07-04 DIAGNOSIS — J441 Chronic obstructive pulmonary disease with (acute) exacerbation: Secondary | ICD-10-CM

## 2021-07-04 DIAGNOSIS — Z20822 Contact with and (suspected) exposure to covid-19: Secondary | ICD-10-CM | POA: Diagnosis not present

## 2021-07-04 DIAGNOSIS — Z7982 Long term (current) use of aspirin: Secondary | ICD-10-CM | POA: Insufficient documentation

## 2021-07-04 DIAGNOSIS — J45909 Unspecified asthma, uncomplicated: Secondary | ICD-10-CM | POA: Insufficient documentation

## 2021-07-04 DIAGNOSIS — F17210 Nicotine dependence, cigarettes, uncomplicated: Secondary | ICD-10-CM | POA: Diagnosis not present

## 2021-07-04 DIAGNOSIS — J9811 Atelectasis: Secondary | ICD-10-CM | POA: Diagnosis not present

## 2021-07-04 DIAGNOSIS — Z91040 Latex allergy status: Secondary | ICD-10-CM | POA: Insufficient documentation

## 2021-07-04 DIAGNOSIS — R0602 Shortness of breath: Secondary | ICD-10-CM | POA: Diagnosis present

## 2021-07-04 LAB — BASIC METABOLIC PANEL
Anion gap: 8 (ref 5–15)
BUN: 21 mg/dL (ref 8–23)
CO2: 24 mmol/L (ref 22–32)
Calcium: 9.1 mg/dL (ref 8.9–10.3)
Chloride: 104 mmol/L (ref 98–111)
Creatinine, Ser: 0.98 mg/dL (ref 0.44–1.00)
GFR, Estimated: >60 mL/min (ref >60)
Glucose, Bld: 96 mg/dL (ref 70–99)
Potassium: 4.3 mmol/L (ref 3.5–5.1)
Sodium: 136 mmol/L (ref 135–145)

## 2021-07-04 LAB — CBC WITH DIFFERENTIAL/PLATELET
Abs Immature Granulocytes: 0.02 10*3/uL (ref 0.00–0.07)
Basophils Absolute: 0.1 10*3/uL (ref 0.0–0.1)
Basophils Relative: 1 %
Eosinophils Absolute: 0.2 10*3/uL (ref 0.0–0.5)
Eosinophils Relative: 2 %
HCT: 36.6 % (ref 36.0–46.0)
Hemoglobin: 12 g/dL (ref 12.0–15.0)
Immature Granulocytes: 0 %
Lymphocytes Relative: 27 %
Lymphs Abs: 2 10*3/uL (ref 0.7–4.0)
MCH: 30 pg (ref 26.0–34.0)
MCHC: 32.8 g/dL (ref 30.0–36.0)
MCV: 91.5 fL (ref 80.0–100.0)
Monocytes Absolute: 0.8 10*3/uL (ref 0.1–1.0)
Monocytes Relative: 10 %
Neutro Abs: 4.6 10*3/uL (ref 1.7–7.7)
Neutrophils Relative %: 60 %
Platelets: 466 10*3/uL — ABNORMAL HIGH (ref 150–400)
RBC: 4 MIL/uL (ref 3.87–5.11)
RDW: 15.2 % (ref 11.5–15.5)
WBC: 7.6 10*3/uL (ref 4.0–10.5)
nRBC: 0 % (ref 0.0–0.2)

## 2021-07-04 LAB — RESP PANEL BY RT-PCR (FLU A&B, COVID) ARPGX2
Influenza A by PCR: NEGATIVE
Influenza B by PCR: NEGATIVE
SARS Coronavirus 2 by RT PCR: NEGATIVE

## 2021-07-04 MED ORDER — PREDNISONE 10 MG PO TABS
20.0000 mg | ORAL_TABLET | Freq: Every day | ORAL | 0 refills | Status: DC
  Filled 2021-07-04: qty 15, 7d supply, fill #0

## 2021-07-04 MED ORDER — METHOCARBAMOL 500 MG PO TABS
ORAL_TABLET | ORAL | 1 refills | Status: DC
  Filled 2021-07-04: qty 42, 10d supply, fill #0
  Filled 2021-07-17: qty 42, 10d supply, fill #1

## 2021-07-04 MED ORDER — PREDNISONE 20 MG PO TABS
ORAL_TABLET | ORAL | 0 refills | Status: DC
Start: 2021-07-04 — End: 2021-07-04
  Filled 2021-07-04: qty 10, 5d supply, fill #0

## 2021-07-04 MED ORDER — MONTELUKAST SODIUM 10 MG PO TABS
ORAL_TABLET | Freq: Every day | ORAL | 0 refills | Status: DC
  Filled 2021-07-04: qty 30, 30d supply, fill #0

## 2021-07-04 MED ORDER — NEBIVOLOL HCL 10 MG PO TABS
ORAL_TABLET | Freq: Every day | ORAL | 1 refills | Status: DC
  Filled 2021-07-04: qty 90, fill #0
  Filled 2021-07-26: qty 30, 30d supply, fill #0
  Filled 2021-08-20: qty 30, 30d supply, fill #1
  Filled 2021-09-25: qty 30, 30d supply, fill #2
  Filled 2021-11-07: qty 30, 30d supply, fill #3
  Filled 2021-12-12: qty 30, 30d supply, fill #4
  Filled 2022-01-22: qty 30, 30d supply, fill #5

## 2021-07-04 MED ORDER — PREDNISONE 20 MG PO TABS: 60.0000 mg | ORAL_TABLET | Freq: Once | ORAL | Status: DC

## 2021-07-04 NOTE — Telephone Encounter (Signed)
Requested Prescriptions  Pending Prescriptions Disp Refills   nebivolol (BYSTOLIC) 10 MG tablet 90 tablet 4    Sig: TAKE 1 TABLET (10 MG TOTAL) BY MOUTH DAILY.     Cardiovascular:  Beta Blockers Passed - 07/04/2021  9:40 AM      Passed - Last BP in normal range    BP Readings from Last 1 Encounters:  07/04/21 105/81         Passed - Last Heart Rate in normal range    Pulse Readings from Last 1 Encounters:  07/04/21 62         Passed - Valid encounter within last 6 months    Recent Outpatient Visits          3 months ago Chronic low back pain, unspecified back pain laterality, unspecified whether sciatica present   [place], Michelle P, NP   4 months ago COPD exacerbation [facility])   [place] Elsie Stain, MD   5 months ago Acute cystitis without hematuria   [place], MD   7 months ago Prediabetes   [place], Dionne Bucy, [place]   1 year ago Prediabetes   [place], MD

## 2021-07-04 NOTE — ED Provider Notes (Signed)
[facility] DEPT Provider Note   CSN: [id number] Arrival date & time: 07/04/21  1037     History Chief Complaint  Patient presents with   Shortness of Breath    [name] [name] is a 65 y.o. female.  HPI Seen by me initially at 11:18 AM.  She is an elderly female who is a smoker ongoing, using albuterol inhaler at home without relief.  She complains of ongoing shortness of breath.  She was unable to find her albuterol nebulizer.  She denies fever, chills, chest pain, weakness or dizziness.  She is taking her usual medicines without relief.  She has had COVID vaccines but not flu vaccines.    Past Medical History:  Diagnosis Date   Allergy    seasonal   Anxiety    takes Lexapro daily   Arthritis    "back, from neck down pass my bra area" (03/25/2017)   Asthma    Bartholin gland cyst 08/29/2011   Bruises easily    pt is on Effient   Chronic back pain    herniated nucleus pulposus   Chronic back pain    "neck to bra area; lower back" (03/25/2017)   Chronic kidney disease    recurrent UTI's this year 2022   COPD (chronic obstructive pulmonary disease) (HCC)    early stages   Coronary artery disease    Depression    takes Klonopin daily   Diabetes mellitus without complication (HCC)    Diverticulosis    GERD (gastroesophageal reflux disease)    takes Nexium daily   H/O hiatal hernia    Heart attack ([place]) 2011   Hemorrhoids    Hernia    Hyperlipidemia    takes Lipitor daily   Hypertension    takes Losartan daily and Labetalol bid   Hypothyroidism    takes Synthroid daily   Insomnia    hydroxyzine prn   Joint pain    Pneumonia    "couple times" (03/25/2017)   Pre-diabetes    "just found out 1 wk ago" (03/25/2017)   Psoriasis    elbows,knees,back   Shortness of breath    with exertion   Slowing of urinary stream    Stress incontinence     Patient Active Problem List   Diagnosis Date Noted   Major depressive disorder, recurrent,  in full remission with anxious distress ([place]) 05/31/2021   Rotator cuff tendinitis, right 05/08/2021   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   Chronic neck pain 02/26/2021   GAD (generalized anxiety disorder) 01/08/2021   Acute cystitis without hematuria 11/07/2020   Influenza vaccine needed 06/15/2020   Abdominal pain, epigastric 04/28/2019   Esophageal dysphagia 04/28/2019   Bipolar I disorder ([place]) 09/17/2018   Insomnia 10/28/2017   Prediabetes 05/28/2017   QT prolongation 03/25/2017   Encounter for screening mammogram for breast cancer 09/11/2015   Psoriasis 06/22/2015   COPD (chronic obstructive pulmonary disease) (HCC)GOLD C  05/18/2015   Anxiety and depression 07/10/2013   Essential hypertension, benign 06/07/2013   Dyslipidemia 06/07/2013   Asthma, chronic 06/07/2013   Emphysema lung ([place]) 06/07/2013   Chronic back pain 06/07/2013   CAD S/P percutaneous coronary angioplasty 06/07/2013   GERD (gastroesophageal reflux disease) 06/07/2013   Breast lump on left side at 3 o'clock position 10/13/2012   S/P abdominal hysterectomy and right salpingo-oophorectomy 08/29/2011   COPD exacerbation ([place]) 09/15/2009   TOBACCO ABUSE 07/17/2009   Chronic rhinitis 07/17/2009   Lung nodule < 6cm on  CT 07/17/2009   ALLERGY, FOOD 07/17/2009   Hypothyroidism 12/22/2008    Past Surgical History:  Procedure Laterality Date   [place]   "left one of my ovaries"   BACK SURGERY     BIOPSY  05/27/2019   Procedure: BIOPSY;  Surgeon: Daneil Dolin, MD;  [facility];  Service: Endoscopy;;   COLONOSCOPY  2011   Dr. Ardis Hughs: Mild diverticulosis, descending diminutive colon polyp (not retrieved), next colonoscopy 10 years   CORONARY ANGIOPLASTY WITH STENT PLACEMENT  2011 X2   "regular stents didn't work; had to go back in in ~ 1 month and put in medicated stents"   [place]      ESOPHAGOGASTRODUODENOSCOPY     ESOPHAGOGASTRODUODENOSCOPY (EGD) WITH PROPOFOL N/A 05/27/2019   normal esophagus, dilation, erosive gastropathy s/p biopsy, normal duodenum. Negative H.pylori.    LEFT HEART CATH AND CORONARY ANGIOGRAPHY N/A 03/26/2017   Procedure: LEFT HEART CATH AND CORONARY ANGIOGRAPHY;  Surgeon: Belva Crome, MD;  Location: [facility];  Service: Cardiovascular;  Laterality: N/A;   LUMBAR LAMINECTOMY/DECOMPRESSION MICRODISCECTOMY  08/05/2011   Procedure: LUMBAR LAMINECTOMY/DECOMPRESSION MICRODISCECTOMY;  Surgeon: Otilio Connors, MD;  Location: [facility];  Service: Neurosurgery;  Laterality: Right;  Right Lumbar four-five extraforaminal discectomy   MALONEY DILATION N/A 05/27/2019   Procedure: Venia Minks DILATION;  Surgeon: Daneil Dolin, MD;  Location: [facility];  Service: Endoscopy;  Laterality: N/A;  14   TONSILLECTOMY     as a child   UPPER GASTROINTESTINAL ENDOSCOPY       OB History     Gravida  1   Para  1   Term  1   Preterm  0   AB  0   Living  1      SAB  0   IAB  0   Ectopic  0   Multiple  0   Live Births              Family History  Problem Relation Age of Onset   Heart disease Mother    Hypertension Mother    Stroke Mother    Mental illness Mother    Heart disease Father    Hypertension Father    Diabetes Father    Colon polyps Brother    Breast cancer Maternal Aunt    Throat cancer Maternal Uncle    Thyroid disease Paternal Aunt    Heart disease Maternal Grandfather    Cancer Paternal Grandmother        mouth   Anesthesia problems Daughter    Hypotension Neg Hx    Malignant hyperthermia Neg Hx    Pseudochol deficiency Neg Hx    Colon cancer Neg Hx    Stomach cancer Neg Hx    Esophageal cancer Neg Hx    Pancreatic cancer Neg Hx    Rectal cancer Neg Hx     Social History   Tobacco Use   Smoking status: Every Day    Packs/day: 1.00    Years: 50.00    Pack years: 50.00    Types: Cigarettes    Smokeless tobacco: Never  Vaping Use   Vaping Use: Never used  Substance Use Topics   Alcohol use: Yes    Comment: once a week   Drug use: No    Types: Cocaine    Comment: has been years per pt    Home  Medications Prior to Admission medications   Medication Sig Start Date End Date Taking? Authorizing Provider  albuterol (VENTOLIN HFA) 108 (90 Base) MCG/ACT inhaler INHALE 2 PUFFS INTO THE LUNGS EVERY 6 (SIX) HOURS AS NEEDED FOR WHEEZING OR SHORTNESS OF BREATH. 01/22/21   Ladell Pier, MD  amLODipine (NORVASC) 5 MG tablet TAKE 1 TABLET (5 MG TOTAL) BY MOUTH DAILY. 06/15/20 07/11/21  Ladell Pier, MD  aspirin EC 81 MG tablet Take 81 mg by mouth daily.    [provider]  atorvastatin (LIPITOR) 80 MG tablet Take 1 tablet (80 mg total) by mouth daily. please schedule appointment for more refills 05/04/21 06/07/21  Camillia Herter, NP  Blood Glucose Monitoring Suppl (TRUE METRIX METER) DEVI 1 kit by Does not apply route 4 (four) times daily. 04/04/17   Brayton Caves, PA-C  busPIRone (BUSPAR) 15 MG tablet TAKE 2 TABLETS IN THE MORNING, 1 TAB AT NOON AND 1 TAB IN THE EVENING 05/31/21 05/31/22  Nwoko, Terese Door, PA  cetirizine (ZYRTEC) 10 MG tablet Take 10 mg by mouth as needed.    [provider]  clonazePAM (KLONOPIN) 0.5 MG tablet Take 1 tablet (0.5 mg total) by mouth 2 (two) times daily as needed for anxiety. 01/08/21   [place]ada Crane, MD  doxepin (SINEQUAN) 50 MG capsule TAKE 1 CAPSULE (50 MG TOTAL) BY MOUTH AT BEDTIME. 05/31/21 05/31/22  Nwoko, Terese Door, PA  doxycycline (VIBRA-TABS) 100 MG tablet Take 1 tablet (100 mg total) by mouth 2 (two) times daily. 02/26/21   Elsie Stain, MD  escitalopram (LEXAPRO) 10 MG tablet TAKE 3 TABLETS (30 MG TOTAL) BY MOUTH DAILY. 05/31/21 05/31/22  Malachy Mood, PA  Fluticasone-Umeclidin-Vilant (TRELEGY ELLIPTA) 100-62.5-25 MCG/ACT AEPB inhale 1 puff into the lungs daily 02/26/21   Elsie Stain, MD  furosemide (LASIX) 20  MG tablet Take 1 tablet (20 mg total) by mouth daily. 07/24/20   Ladell Pier, MD  gabapentin (NEURONTIN) 600 MG tablet TAKE 1 TABLET BY MOUTH 4 TIMES DAILY 09/29/20 09/29/21  Ladell Pier, MD  glucose blood (TRUE METRIX BLOOD GLUCOSE TEST) test strip Use as instructed 01/21/18   Ladell Pier, MD  HYDROcodone-acetaminophen (NORCO/VICODIN) 5-325 MG tablet take 1 tablet by oral route  every 6 hours as needed for pain 04/26/21     ipratropium-albuterol (DUONEB) 0.5-2.5 (3) MG/3ML SOLN TAKE 3 MLS VIA NEBULIZATION EVERY 6 HOURS Patient taking differently: TAKE 3 MLS VIA NEBULIZATION EVERY 6 HOURS as needed 11/18/19   Elsie Stain, MD  levothyroxine (SYNTHROID) 100 MCG tablet TAKE 1 TABLET (100 MCG TOTAL) BY MOUTH DAILY. 06/11/21 06/11/22  Ladell Pier, MD  losartan (COZAAR) 50 MG tablet TAKE 1 & 1/2 TABLETS BY MOUTH DAILY. 05/03/21 05/03/22  Ladell Pier, MD  metFORMIN (GLUCOPHAGE) 500 MG tablet TAKE 1 TABLET (500 MG TOTAL) BY MOUTH 2 (TWO) TIMES DAILY WITH A MEAL. 11/28/20 11/28/21  Argentina Donovan, PA-C  methocarbamol (ROBAXIN) 500 MG tablet Take 1 tablet (500 mg total) by mouth every 6 (six) hours as needed for muscle spasms. 02/26/21   Elsie Stain, MD  methocarbamol (ROBAXIN) 500 MG tablet Take 1 tablet by mouth 4 times daily 07/04/21     mirtazapine (REMERON) 30 MG tablet TAKE 1 TABLET (30 MG TOTAL) BY MOUTH AT BEDTIME. 05/31/21 05/31/22  Nwoko, Isidoro Donning E, PA  montelukast (SINGULAIR) 10 MG tablet TAKE 1 TABLET BY MOUTH AT BEDTIME. 07/04/21 07/04/22  Ladell Pier, MD  nebivolol (BYSTOLIC)  10 MG tablet TAKE 1 TABLET (10 MG TOTAL) BY MOUTH DAILY. 07/04/21 07/04/22  Ladell Pier, MD  nitroGLYCERIN (NITROSTAT) 0.4 MG SL tablet Place 1 tablet (0.4 mg total) under the tongue every 5 (five) minutes as needed for chest pain. 11/08/19 01/29/21  Kroeger, Lorelee Cover., PA-C  omeprazole (PRILOSEC) 40 MG capsule TAKE 1 CAPSULE (40 MG TOTAL) BY MOUTH 2 (TWO) TIMES DAILY  BEFORE A MEAL. 06/11/21 06/11/22  Ladell Pier, MD  predniSONE (DELTASONE) 20 MG tablet Take 2 tablets by mouth daily for 5 days 07/04/21   Ladell Pier, MD  promethazine-dextromethorphan (PROMETHAZINE-DM) 6.25-15 MG/5ML syrup Take 5 mLs by mouth 4 (four) times daily as needed for cough. 02/26/21   Elsie Stain, MD  sucralfate (CARAFATE) 1 g tablet Take 1 tablet (1 g total) by mouth 4 (four) times daily -  with meals and at bedtime. 04/18/21 05/18/21  Elsie Stain, MD  ticagrelor (BRILINTA) 60 MG TABS tablet TAKE 1 TABLET (60 MG TOTAL) BY MOUTH 2 (TWO) TIMES DAILY. 03/12/21 03/12/22  Ladell Pier, MD  traMADol (ULTRAM) 50 MG tablet Take 1 tablet (50 mg total) by mouth every 6 (six) hours as needed. 03/17/21   Carlisle Cater, PA-C  triamcinolone cream (KENALOG) 0.1 % APPLY 1 APPLICATION TOPICALLY 2 TIMES DAILY. 06/19/18   Ladell Pier, MD  TRUEPLUS LANCETS 28G MISC 28 g by Does not apply route QID. 04/04/17   Brayton Caves, PA-C  Vitamin E 180 MG CAPS Take 180 mg by mouth daily.    [provider]    Allergies    Avocado, Latex, and Codeine  Review of Systems   Review of Systems  All other systems reviewed and are negative.  Physical Exam Updated Vital Signs BP (!) 119/55    Pulse (!) 54    Temp 98.1 F (36.7 C) (Oral)    Resp (!) 22    SpO2 96%   Physical Exam Vitals and nursing note reviewed.  Constitutional:      General: She is not in acute distress.    Appearance: She is well-developed. She is not ill-appearing.  HENT:     Head: Normocephalic and atraumatic.     Nose: No congestion or rhinorrhea.  Eyes:     Conjunctiva/sclera: Conjunctivae normal.     Pupils: Pupils are equal, round, and reactive to light.  Neck:     Trachea: Phonation normal.  Cardiovascular:     Rate and Rhythm: Normal rate and regular rhythm.  Pulmonary:     Effort: Pulmonary effort is normal.     Comments: Decreased air movement bilaterally with scattered rhonchi and  few wheezes.  No increased work of breathing. Chest:     Chest wall: No tenderness.  Abdominal:     General: There is no distension.     Palpations: Abdomen is soft.     Tenderness: There is no abdominal tenderness. There is no guarding.  Musculoskeletal:        General: Normal range of motion.     Cervical back: Normal range of motion and neck supple.     Right lower leg: No edema.     Left lower leg: No edema.  Skin:    General: Skin is warm and dry.  Neurological:     Mental Status: She is alert and oriented to person, place, and time.     Motor: No abnormal muscle tone.  Psychiatric:        Mood and  Affect: Mood normal.        Behavior: Behavior normal.        Thought Content: Thought content normal.        Judgment: Judgment normal.    ED Results / Procedures / Treatments   Labs (all labs ordered are listed, but only abnormal results are displayed) Labs Reviewed  CBC WITH DIFFERENTIAL/PLATELET - Abnormal; Notable for the following components:      Result Value   Platelets 466 (*)    All other components within normal limits  RESP PANEL BY RT-PCR (FLU A&B, COVID) [id number]  BASIC METABOLIC PANEL    EKG EKG Interpretation  Date/Time:  Wednesday July 04 2021 10:50:21 EST Ventricular Rate:  60 PR Interval:  153 QRS Duration: 87 QT Interval:  460 QTC Calculation: 460 R Axis:   82 Text Interpretation: Sinus rhythm Borderline right axis deviation Baseline wander since last tracing no significant change Confirmed by Daleen Bo [phone]) on 07/04/2021 1:51:47 PM  Radiology DG Chest 2 View  Result Date: 07/04/2021 CLINICAL DATA:  Shortness of breath EXAM: CHEST - 2 VIEW COMPARISON:  None. FINDINGS: The heart size and mediastinal contours are within normal limits. Both lungs are clear. Bibasilar subsegmental linear atelectasis. The visualized skeletal structures are unremarkable. IMPRESSION: No active cardiopulmonary disease. Electronically Signed   By: Keane Police  D.O.   On: 07/04/2021 11:22    Procedures Procedures   Medications Ordered in ED Medications  predniSONE (DELTASONE) tablet 60 mg (has no administration in time range)    ED Course  I have reviewed the triage vital signs and the nursing notes.  Pertinent labs & imaging results that were available during my care of the patient were reviewed by me and considered in my medical decision making (see chart for details).    MDM Rules/Calculators/A&P                          Patient Vitals for the past 24 hrs:  BP Temp Temp src Pulse Resp SpO2  07/04/21 1430 (!) 119/55 -- -- (!) 54 (!) 22 96 %  07/04/21 1400 (!) 117/58 -- -- (!) 53 17 98 %  07/04/21 1343 108/75 -- -- (!) 52 14 95 %  07/04/21 1047 -- 98.1 F (36.7 C) Oral -- -- --  07/04/21 1046 105/81 -- -- -- (!) 22 --  07/04/21 1043 -- -- -- 62 -- 100 %    2:48 PM Reevaluation with update and discussion. After initial assessment and treatment, an updated evaluation reveals lungs with improved air movement.  No increased work of breathing.  Vital signs are reassuring. Daleen Bo   Medical Decision Making:  This patient is presenting for evaluation of shortness of breath, which does require a range of treatment options, and is a complaint that involves a moderate risk of morbidity and mortality. The differential diagnoses include pneumonia, COPD exacerbation, viral infection. I decided to review old records, and in summary elderly female, ongoing smoker, presenting with shortness of breath..  I did not require additional historical information from anyone.  Clinical Laboratory Tests Ordered, included CBC, Metabolic panel, and viral panel . Review indicates normal. Radiologic Tests Ordered, included chest x-ray.  I independently Visualized: Radiographic images, which show no infiltrate or edema  Cardiac Monitor Tracing which shows normal sinus rhythm     Critical Interventions-clinical evaluation, laboratory tests,  radiography, observation and reassessment  After These Interventions, the Patient was reevaluated and was found  stable for discharge.  Findings consistent with COPD exacerbation.  No indication for hospitalization, treatment with antibiotics for respiratory infection.  CRITICAL CARE-no Performed by: Daleen Bo  Nursing Notes Reviewed/ Care Coordinated Applicable Imaging Reviewed Interpretation of Laboratory Data incorporated into ED treatment  The patient appears reasonably screened and/or stabilized for discharge and I doubt any other medical condition or other [facility] requiring further screening, evaluation, or treatment in the ED at this time prior to discharge.  Plan: Home Medications-continue; Home Treatments-gradual advance diet and activity; return here if the recommended treatment, does not improve the symptoms; Recommended follow up-follow-up PCP if not better in 3 to 5 days.        Final Clinical Impression(s) / ED Diagnoses Final diagnoses:  COPD exacerbation ([place])    Rx / DC Orders ED Discharge Orders          Ordered    predniSONE (DELTASONE) 10 MG tablet  Daily        07/04/21 1546             Daleen Bo, MD 07/09/21 [phone]

## 2021-07-04 NOTE — Telephone Encounter (Signed)
Requested medication (s) are due for refill today: yes  Requested medication (s) are on the active medication list: yes  Last refill:  03/08/21- 03/08/22 #30 2 refills   Future visit scheduled: no  Notes to clinic:  [id number]     Requested Prescriptions  Pending Prescriptions Disp Refills   montelukast (SINGULAIR) 10 MG tablet 30 tablet 2    Sig: TAKE 1 TABLET BY MOUTH AT BEDTIME.     Pulmonology:  Leukotriene Inhibitors Passed - 07/04/2021  9:40 AM      Passed - Valid encounter within last 12 months    Recent Outpatient Visits           3 months ago Chronic low back pain, unspecified back pain laterality, unspecified whether sciatica present   [place], Michelle P, NP   4 months ago COPD exacerbation [facility])   [place] Elsie Stain, MD   5 months ago Acute cystitis without hematuria   [place], MD   7 months ago [place], Dionne Bucy, [place]   1 year ago Prediabetes   [place] Ladell Pier, MD

## 2021-07-04 NOTE — ED Provider Notes (Signed)
Emergency Medicine Provider Triage Evaluation Note  [name] [name] , a 65 y.o. female  was evaluated in triage.  Pt complains of COPD exacerbation.  She has had COVID vaccines.  She is a smoker.  She uses albuterol inhaler at home without relief.  She lost her albuterol nebulizer.  Review of Systems  Positive: Cough, shortness of breath Negative: No fever or vomiting  Physical Exam  BP 105/81 (BP Location: Right Arm)    Pulse 62    Temp 98.1 F (36.7 C) (Oral)    Resp (!) 22    SpO2 100%  Gen:   Awake, no distress   Resp:  Normal effort .  Decreased air movement bilaterally with scattered wheezes.  No rhonchi.  No increased work of breathing. MSK:   Moves extremities without difficulty  Other:  Nontoxic appearance  Medical Decision Making  Medically screening exam initiated at 11:11 AM.  Appropriate orders placed.  NYKIA TURKO was informed that the remainder of the evaluation will be completed by another provider, this initial triage assessment does not replace that evaluation, and the importance of remaining in the ED until their evaluation is complete.  Will check labs and chest x-ray, then reassess   Daleen Bo, MD 07/04/21 1123

## 2021-07-04 NOTE — Discharge Instructions (Signed)
Start the prednisone prescription, tomorrow.  It was sent to your pharmacy.  See your doctor for problems.  Use your albuterol inhaler 2 puffs every 4 hours as needed for cough or trouble breathing.

## 2021-07-04 NOTE — Telephone Encounter (Signed)
Contacted pt and lvm letting pt know that Dr. Wynetta Emery sent in prednisone and if she has any questions or concerns to give [place] a call

## 2021-07-04 NOTE — ED Notes (Signed)
Pt to xray

## 2021-07-04 NOTE — ED Triage Notes (Signed)
Pt arrived via POV, c/o SOB, worsening since last night. States worsening since last night. Hx of COPD. States recent move, unable to locate nebs

## 2021-07-04 NOTE — Telephone Encounter (Signed)
Patient called agent- trouble breathing- SOB , patient had COPD and can't locate her inhaler. Patient disconnected before transfer. Call back x2 and patient did not pick up.  Will place in call back for retry later

## 2021-07-04 NOTE — Telephone Encounter (Signed)
°  Chief Complaint: SOB Symptoms: SOB- at rest, COPD hx- unable to locate nebulizer- requesting prednisone, cough and chest congestion Frequency: 2-3 days Pertinent Negatives: Patient denies  Disposition: [x] ED /[] Urgent Care (no appt availability in office) / [] Appointment(In office/virtual)/ []  [facility]/ [] Home Care/ [x] Refused Recommended Disposition  Additional Notes: Patient declines ED- although she states she will try to find transportation. Patient requesting prednisone  Reason for Disposition  [1] MODERATE difficulty breathing (e.g., speaks in phrases, SOB even at rest, pulse 100-120) AND [2] NEW-onset or WORSE than normal  Answer Assessment - Initial Assessment Questions 1. RESPIRATORY STATUS: "Describe your breathing?" (e.g., wheezing, shortness of breath, unable to speak, severe coughing)      SOB-coughing 2. ONSET: "When did this breathing problem begin?"      2 days 3. PATTERN "Does the difficult breathing come and go, or has it been constant since it started?"      constant 4. SEVERITY: "How bad is your breathing?" (e.g., mild, moderate, severe)    - MILD: No SOB at rest, mild SOB with walking, speaks normally in sentences, can lie down, no retractions, pulse < 100.    - MODERATE: SOB at rest, SOB with minimal exertion and prefers to sit, cannot lie down flat, speaks in phrases, mild retractions, audible wheezing, pulse 100-120.    - SEVERE: Very SOB at rest, speaks in single words, struggling to breathe, sitting hunched forward, retractions, pulse > 120      moderate 5. RECURRENT SYMPTOM: "Have you had difficulty breathing before?" If Yes, ask: "When was the last time?" and "What happened that time?"      COPD- unable to located nebulizer 6. CARDIAC HISTORY: "Do you have any history of heart disease?" (e.g., heart attack, angina, bypass surgery, angioplasty)      Hx heart attack 7. LUNG HISTORY: "Do you have any history of lung disease?"  (e.g., pulmonary  embolus, asthma, emphysema)     COPD 8. CAUSE: "What do you think is causing the breathing problem?"      Sinus congestion-chest congestion, COPD 9. OTHER SYMPTOMS: "Do you have any other symptoms? (e.g., dizziness, runny nose, cough, chest pain, fever)     Cough, chest congestion, fever 10. O2 SATURATION MONITOR:  "Do you use an oxygen saturation monitor (pulse oximeter) at home?" If Yes, "What is your reading (oxygen level) today?" "What is your usual oxygen saturation reading?" (e.g., 95%)       no 11. PREGNANCY: "Is there any chance you are pregnant?" "When was your last menstrual period?"       na 12. TRAVEL: "Have you traveled out of the country in the last month?" (e.g., travel history, exposures)       no  Protocols used: Breathing Difficulty-A-AH

## 2021-07-04 NOTE — Addendum Note (Signed)
Addended by: Karle Plumber B on: 07/04/2021 12:30 PM   Modules accepted: Orders

## 2021-07-05 ENCOUNTER — Other Ambulatory Visit: Payer: Self-pay

## 2021-07-06 ENCOUNTER — Other Ambulatory Visit: Payer: Self-pay

## 2021-07-11 ENCOUNTER — Ambulatory Visit: Payer: Self-pay | Admitting: *Deleted

## 2021-07-11 NOTE — Telephone Encounter (Signed)
Scheduled virtual appt with Dr. Wynetta Emery, 07/12/2021 at 0810  Patient aware.

## 2021-07-11 NOTE — Telephone Encounter (Signed)
°  Chief Complaint: sinus pressure, some SOB Symptoms: facial pressure, SOB while using nebs Frequency: about 2 weeks Pertinent Negatives: Patient denies fever Disposition: [] ED /[] Urgent Care (no appt availability in office) / [x] Appointment(In office/virtual)/ []  [facility]/ [] Home Care/ [] Refused Recommended Disposition  Additional Notes: Seen in the ED with COPD exacerbation and prescribed prednisone on 07/04/21. No answer with call to office. Routing to office for call to patient for virtual appointment.   Reason for Disposition  [1] MODERATE longstanding difficulty breathing (e.g., speaks in phrases, SOB even at rest, pulse 100-120) AND [2] SAME as normal  Answer Assessment - Initial Assessment Questions 1. RESPIRATORY STATUS: "Describe your breathing?" (e.g., wheezing, shortness of breath, unable to speak, severe coughing)      Cough,SOB with resting and activity 2. ONSET: "When did this breathing problem begin?"      Over one week 3. PATTERN "Does the difficult breathing come and go, or has it been constant since it started?"      constant 4. SEVERITY: "How bad is your breathing?" (e.g., mild, moderate, severe)    - MILD: No SOB at rest, mild SOB with walking, speaks normally in sentences, can lie down, no retractions, pulse < 100.    - MODERATE: SOB at rest, SOB with minimal exertion and prefers to sit, cannot lie down flat, speaks in phrases, mild retractions, audible wheezing, pulse 100-120.    - SEVERE: Very SOB at rest, speaks in single words, struggling to breathe, sitting hunched forward, retractions, pulse > 120      mild 5. RECURRENT SYMPTOM: "Have you had difficulty breathing before?" If Yes, ask: "When was the last time?" and "What happened that time?"      Yes COPD 6. CARDIAC HISTORY: "Do you have any history of heart disease?" (e.g., heart attack, angina, bypass surgery, angioplasty)       7. LUNG HISTORY: "Do you have any history of lung disease?"   (e.g., pulmonary embolus, asthma, emphysema)     yes 8. CAUSE: "What do you think is causing the breathing problem?"      sinuses 9. OTHER SYMPTOMS: "Do you have any other symptoms? (e.g., dizziness, runny nose, cough, chest pain, fever)     Cough, nasal congestion 10. O2 SATURATION MONITOR:  "Do you use an oxygen saturation monitor (pulse oximeter) at home?" If Yes, "What is your reading (oxygen level) today?" "What is your usual oxygen saturation reading?" (e.g., 95%)        11. PREGNANCY: "Is there any chance you are pregnant?" "When was your last menstrual period?"       12. TRAVEL: "Have you traveled out of the country in the last month?" (e.g., travel history, exposures)  Protocols used: Breathing Difficulty-A-AH

## 2021-07-12 ENCOUNTER — Other Ambulatory Visit: Payer: Self-pay

## 2021-07-12 ENCOUNTER — Ambulatory Visit: Payer: Medicare Other | Attending: Internal Medicine | Admitting: Internal Medicine

## 2021-07-12 ENCOUNTER — Other Ambulatory Visit: Payer: Self-pay | Admitting: Internal Medicine

## 2021-07-12 DIAGNOSIS — I10 Essential (primary) hypertension: Secondary | ICD-10-CM

## 2021-07-12 DIAGNOSIS — J449 Chronic obstructive pulmonary disease, unspecified: Secondary | ICD-10-CM | POA: Diagnosis not present

## 2021-07-12 DIAGNOSIS — R0981 Nasal congestion: Secondary | ICD-10-CM | POA: Diagnosis not present

## 2021-07-12 MED ORDER — PREDNISONE 10 MG PO TABS
ORAL_TABLET | ORAL | 0 refills | Status: DC
  Filled 2021-07-12: qty 18, 9d supply, fill #0

## 2021-07-12 MED ORDER — AMLODIPINE BESYLATE 5 MG PO TABS
ORAL_TABLET | Freq: Every day | ORAL | 1 refills | Status: DC
  Filled 2021-07-12 – 2021-08-20 (×2): qty 30, 30d supply, fill #0
  Filled 2021-09-25: qty 30, 30d supply, fill #1
  Filled 2021-11-07: qty 30, 30d supply, fill #2
  Filled 2021-12-12: qty 30, 30d supply, fill #3
  Filled 2022-01-22: qty 30, 30d supply, fill #4

## 2021-07-12 MED ORDER — DOXYCYCLINE HYCLATE 100 MG PO TABS
100.0000 mg | ORAL_TABLET | Freq: Two times a day (BID) | ORAL | 0 refills | Status: DC
  Filled 2021-07-12: qty 14, 7d supply, fill #0

## 2021-07-12 NOTE — Progress Notes (Signed)
Patient ID: [name] [name], female   DOB: Jan 13, 1956, 65 y.o.   MRN: [id number] Virtual Visit via Telephone Note We try to do this as a video visit but patient was not sure how to do the video visit from her MyChart account. I connected with [name] on 07/12/2021 at 8:28 a.m by telephone and verified that I am speaking with the correct person using two identifiers  Location: Patient: home Provider: office  Participants: Myself Patient   I discussed the limitations, risks, security and privacy concerns of performing an evaluation and management service by telephone and the availability of in person appointments. I also discussed with the patient that there may be a patient responsible charge related to this service. The patient expressed understanding and agreed to proceed.   History of Present Illness: Patient with history of pre-DM, GERD, hypothyroidism, HTN, HL, CAD with previous stent, COPD, substance abuse, depression/anxiety and tobacco dependence.  This is an urgent care visit.  Patient was seen in the emergency room 07/04/2021 with complaints of shortness of breath.  She reports that she misplaced her nebulizer machine.  Her breathing was found not to be labored but decreased breath sounds.  Chest x-ray was negative for any acute findings.  CBC revealed normal WBC.  COVID and rapid flu test negative.  Chemistry normal.  Patient discharged on prednisone.  However I had already sent a prescription for prednisone for 40 mg once a day x5 days to her pharmacy which she did pick up.  Today she reports that she is not feeling any better.  Denies fever.  Endorses cough productive of thick white phlegm, nasal/sinus and chest congestion, shortness of breath.  She has been using an over-the-counter allergy pill plus Tussin DM.  She completed the prednisone but feels like it did not help.  She found her nebulizer machine yesterday and has used it 3 times already.  Reports compliance with Trelegy  inhaler.     Outpatient Encounter Medications as of 07/12/2021  Medication Sig   albuterol (VENTOLIN HFA) 108 (90 Base) MCG/ACT inhaler INHALE 2 PUFFS INTO THE LUNGS EVERY 6 (SIX) HOURS AS NEEDED FOR WHEEZING OR SHORTNESS OF BREATH.   amLODipine (NORVASC) 5 MG tablet TAKE 1 TABLET (5 MG TOTAL) BY MOUTH DAILY.   aspirin EC 81 MG tablet Take 81 mg by mouth daily.   atorvastatin (LIPITOR) 80 MG tablet Take 1 tablet (80 mg total) by mouth daily. please schedule appointment for more refills   Blood Glucose Monitoring Suppl (TRUE METRIX METER) DEVI 1 kit by Does not apply route 4 (four) times daily.   busPIRone (BUSPAR) 15 MG tablet TAKE 2 TABLETS IN THE MORNING, 1 TAB AT NOON AND 1 TAB IN THE EVENING   cetirizine (ZYRTEC) 10 MG tablet Take 10 mg by mouth as needed.   clonazePAM (KLONOPIN) 0.5 MG tablet Take 1 tablet (0.5 mg total) by mouth 2 (two) times daily as needed for anxiety.   doxepin (SINEQUAN) 50 MG capsule TAKE 1 CAPSULE (50 MG TOTAL) BY MOUTH AT BEDTIME.   doxycycline (VIBRA-TABS) 100 MG tablet Take 1 tablet (100 mg total) by mouth 2 (two) times daily.   escitalopram (LEXAPRO) 10 MG tablet TAKE 3 TABLETS (30 MG TOTAL) BY MOUTH DAILY.   Fluticasone-Umeclidin-Vilant (TRELEGY ELLIPTA) 100-62.5-25 MCG/ACT AEPB inhale 1 puff into the lungs daily   furosemide (LASIX) 20 MG tablet Take 1 tablet (20 mg total) by mouth daily.   gabapentin (NEURONTIN) 600 MG tablet TAKE 1 TABLET BY  MOUTH 4 TIMES DAILY   glucose blood (TRUE METRIX BLOOD GLUCOSE TEST) test strip Use as instructed   HYDROcodone-acetaminophen (NORCO/VICODIN) 5-325 MG tablet take 1 tablet by oral route  every 6 hours as needed for pain   ipratropium-albuterol (DUONEB) 0.5-2.5 (3) MG/3ML SOLN TAKE 3 MLS VIA NEBULIZATION EVERY 6 HOURS (Patient taking differently: TAKE 3 MLS VIA NEBULIZATION EVERY 6 HOURS as needed)   levothyroxine (SYNTHROID) 100 MCG tablet TAKE 1 TABLET (100 MCG TOTAL) BY MOUTH DAILY.   losartan (COZAAR) 50 MG tablet  TAKE 1 & 1/2 TABLETS BY MOUTH DAILY.   metFORMIN (GLUCOPHAGE) 500 MG tablet TAKE 1 TABLET (500 MG TOTAL) BY MOUTH 2 (TWO) TIMES DAILY WITH A MEAL.   methocarbamol (ROBAXIN) 500 MG tablet Take 1 tablet (500 mg total) by mouth every 6 (six) hours as needed for muscle spasms.   methocarbamol (ROBAXIN) 500 MG tablet Take 1 tablet by mouth 4 times daily   mirtazapine (REMERON) 30 MG tablet TAKE 1 TABLET (30 MG TOTAL) BY MOUTH AT BEDTIME.   montelukast (SINGULAIR) 10 MG tablet TAKE 1 TABLET BY MOUTH AT BEDTIME.   nebivolol (BYSTOLIC) 10 MG tablet TAKE 1 TABLET (10 MG TOTAL) BY MOUTH DAILY.   nitroGLYCERIN (NITROSTAT) 0.4 MG SL tablet Place 1 tablet (0.4 mg total) under the tongue every 5 (five) minutes as needed for chest pain.   omeprazole (PRILOSEC) 40 MG capsule TAKE 1 CAPSULE (40 MG TOTAL) BY MOUTH 2 (TWO) TIMES DAILY BEFORE A MEAL.   predniSONE (DELTASONE) 10 MG tablet Take 2 tablets (20 mg total) by mouth daily.   promethazine-dextromethorphan (PROMETHAZINE-DM) 6.25-15 MG/5ML syrup Take 5 mLs by mouth 4 (four) times daily as needed for cough.   sucralfate (CARAFATE) 1 g tablet Take 1 tablet (1 g total) by mouth 4 (four) times daily -  with meals and at bedtime.   ticagrelor (BRILINTA) 60 MG TABS tablet TAKE 1 TABLET (60 MG TOTAL) BY MOUTH 2 (TWO) TIMES DAILY.   traMADol (ULTRAM) 50 MG tablet Take 1 tablet (50 mg total) by mouth every 6 (six) hours as needed.   triamcinolone cream (KENALOG) 0.1 % APPLY 1 APPLICATION TOPICALLY 2 TIMES DAILY.   TRUEPLUS LANCETS 28G MISC 28 g by Does not apply route QID.   Vitamin E 180 MG CAPS Take 180 mg by mouth daily.   No facility-administered encounter medications on file as of 07/12/2021.      Observations/Objective: Patient is able to talk in complete sentences.  She has mild audible hoarseness and congestion.  Assessment and Plan: 1. COPD with chronic bronchitis ([place]) Patient to continue using her nebulizer as needed.  Continue Trelegy inhaler. Can  use Coricidin HBP over-the-counter instead of the Tussin DM We will give a course of doxycycline along with a prednisone taper - doxycycline (VIBRA-TABS) 100 MG tablet; Take 1 tablet (100 mg total) by mouth 2 (two) times daily.  Dispense: 14 tablet; Refill: 0 - predniSONE (DELTASONE) 10 MG tablet; 3 tabs PO daily x 3 days then 2 tabs daily x 3 days then 1 tab daily x 3 days  Dispense: 18 tablet; Refill: 0  2. Sinus congestion See above.   Follow Up Instructions: PRN   I discussed the assessment and treatment plan with the patient. The patient was provided an opportunity to ask questions and all were answered. The patient agreed with the plan and demonstrated an understanding of the instructions.   The patient was advised to call back or seek an in-person evaluation if  the symptoms worsen or if the condition fails to improve as anticipated.  I  Spent 10 minutes on this telephone encounter  Karle Plumber, MD

## 2021-07-13 ENCOUNTER — Other Ambulatory Visit: Payer: Self-pay

## 2021-07-16 ENCOUNTER — Other Ambulatory Visit: Payer: Self-pay

## 2021-07-17 ENCOUNTER — Other Ambulatory Visit: Payer: Self-pay

## 2021-07-17 ENCOUNTER — Other Ambulatory Visit: Payer: Self-pay | Admitting: Pharmacist

## 2021-07-17 DIAGNOSIS — I10 Essential (primary) hypertension: Secondary | ICD-10-CM

## 2021-07-17 MED ORDER — LOSARTAN POTASSIUM 50 MG PO TABS
ORAL_TABLET | Freq: Every day | ORAL | 2 refills | Status: DC
  Filled 2021-07-17: qty 45, fill #0
  Filled 2021-09-04: qty 45, 30d supply, fill #0
  Filled 2021-10-08: qty 45, 30d supply, fill #1
  Filled 2021-11-21: qty 45, 30d supply, fill #2

## 2021-07-17 MED ORDER — MONTELUKAST SODIUM 10 MG PO TABS
ORAL_TABLET | Freq: Every day | ORAL | 2 refills | Status: DC
  Filled 2021-07-17: qty 30, fill #0
  Filled 2021-08-20: qty 30, 30d supply, fill #0
  Filled 2021-09-25: qty 30, 30d supply, fill #1
  Filled 2021-11-07: qty 30, 30d supply, fill #2

## 2021-07-25 ENCOUNTER — Other Ambulatory Visit: Payer: Self-pay | Admitting: Critical Care Medicine

## 2021-07-25 ENCOUNTER — Other Ambulatory Visit: Payer: Self-pay | Admitting: Physician Assistant

## 2021-07-25 DIAGNOSIS — E1165 Type 2 diabetes mellitus with hyperglycemia: Secondary | ICD-10-CM

## 2021-07-26 ENCOUNTER — Other Ambulatory Visit: Payer: Self-pay

## 2021-07-26 ENCOUNTER — Other Ambulatory Visit: Payer: Self-pay | Admitting: Internal Medicine

## 2021-07-26 DIAGNOSIS — I10 Essential (primary) hypertension: Secondary | ICD-10-CM

## 2021-07-26 DIAGNOSIS — J449 Chronic obstructive pulmonary disease, unspecified: Secondary | ICD-10-CM

## 2021-07-26 MED ORDER — METHOCARBAMOL 500 MG PO TABS
500.0000 mg | ORAL_TABLET | Freq: Two times a day (BID) | ORAL | 1 refills | Status: DC | PRN
  Filled 2021-07-26 (×2): qty 60, 30d supply, fill #0
  Filled 2021-10-08: qty 60, 30d supply, fill #1

## 2021-07-26 MED ORDER — FUROSEMIDE 20 MG PO TABS
20.0000 mg | ORAL_TABLET | Freq: Every day | ORAL | 2 refills | Status: DC
  Filled 2021-07-26: qty 30, 30d supply, fill #0
  Filled 2021-08-29: qty 30, 30d supply, fill #1
  Filled 2021-10-08: qty 30, 30d supply, fill #2

## 2021-07-26 MED ORDER — METFORMIN HCL 500 MG PO TABS
ORAL_TABLET | Freq: Two times a day (BID) | ORAL | 3 refills | Status: DC
  Filled 2021-07-26 (×2): qty 60, fill #0
  Filled 2021-09-25 (×2): qty 60, 30d supply, fill #0
  Filled 2021-11-21: qty 60, 30d supply, fill #1
  Filled 2022-02-07: qty 60, 30d supply, fill #2
  Filled 2022-04-23: qty 60, 30d supply, fill #3

## 2021-07-26 MED ORDER — ALBUTEROL SULFATE HFA 108 (90 BASE) MCG/ACT IN AERS
INHALATION_SPRAY | RESPIRATORY_TRACT | 2 refills | Status: DC
  Filled 2021-07-26: qty 18, 25d supply, fill #0
  Filled 2021-08-29: qty 18, 25d supply, fill #1

## 2021-07-26 MED FILL — Gabapentin Tab 600 MG: ORAL | 30 days supply | Qty: 120 | Fill #0 | Status: AC

## 2021-07-26 NOTE — Telephone Encounter (Signed)
Requested medication (s) are due for refill today:   Yes  Requested medication (s) are on the active medication list:   Yes for all 3  Future visit scheduled:   No  Seen 2 weeks ago   Last ordered: Lasix 07/24/2020 #30, 6 refills;  Albuterol inhaler 01/22/2021 18g, 2 refills,   Robaxin 02/26/2021 #60, 1 refill  Returned because a non delegated refill.   Requested Prescriptions  Pending Prescriptions Disp Refills   furosemide (LASIX) 20 MG tablet 30 tablet 6    Sig: Take 1 tablet (20 mg total) by mouth daily.     Cardiovascular:  Diuretics - Loop Passed - 07/26/2021  8:15 AM      Passed - K in normal range and within 360 days    Potassium  Date Value Ref Range Status  07/04/2021 4.3 3.5 - 5.1 mmol/L Final          Passed - Ca in normal range and within 360 days    Calcium  Date Value Ref Range Status  07/04/2021 9.1 8.9 - 10.3 mg/dL Final          Passed - Na in normal range and within 360 days    Sodium  Date Value Ref Range Status  07/04/2021 136 135 - 145 mmol/L Final  11/24/2020 141 134 - 144 mmol/L Final          Passed - Cr in normal range and within 360 days    Creat  Date Value Ref Range Status  04/16/2016 0.93 0.50 - 1.05 mg/dL Final    Comment:      For patients > or = 66 years of age: The upper reference limit for Creatinine is approximately 13% higher for people identified as African-American.      Creatinine, Ser  Date Value Ref Range Status  07/04/2021 0.98 0.44 - 1.00 mg/dL Final   Creatinine,U  Date Value Ref Range Status  01/11/2010 61.7 mg/dL Final    Comment:    See lab report for associated comment(s)          Passed - Last BP in normal range    BP Readings from Last 1 Encounters:  07/04/21 (!) 102/54          Passed - Valid encounter within last 6 months    Recent Outpatient Visits           2 weeks ago COPD with chronic bronchitis ([place])   [place], Neoma Laming B, MD   4 months ago Chronic  low back pain, unspecified back pain laterality, unspecified whether sciatica present   [place], Michelle P, NP   5 months ago COPD exacerbation [facility])   [place] Elsie Stain, MD   6 months ago Acute cystitis without hematuria   [place] Ladell Pier, MD   8 months ago Prediabetes   [place], [place]               albuterol (VENTOLIN HFA) 108 (90 Base) MCG/ACT inhaler 18 g 2    Sig: INHALE 2 PUFFS INTO THE LUNGS EVERY 6 (SIX) HOURS AS NEEDED FOR WHEEZING OR SHORTNESS OF BREATH.     Pulmonology:  Beta Agonists Failed - 07/26/2021  8:15 AM      Failed - One inhaler should last at least one month. If the patient is requesting  refills earlier, contact the patient to check for uncontrolled symptoms.      Passed - Valid encounter within last 12 months    Recent Outpatient Visits           2 weeks ago COPD with chronic bronchitis ([place])   [place], Neoma Laming B, MD   4 months ago Chronic low back pain, unspecified back pain laterality, unspecified whether sciatica present   [place], Michelle P, NP   5 months ago COPD exacerbation [facility])   [place] Elsie Stain, MD   6 months ago Acute cystitis without hematuria   [place] Ladell Pier, MD   8 months ago Prediabetes   [place], Levada Dy M, [place]               methocarbamol (ROBAXIN) 500 MG tablet 42 tablet 1    Sig: Take 1 tablet by mouth 4 times daily     Not Delegated - Analgesics:  Muscle Relaxants Failed - 07/26/2021  8:15 AM      Failed - This refill cannot be delegated      Passed - Valid encounter within last 6 months    Recent Outpatient Visits           2 weeks ago COPD with  chronic bronchitis ([place])   [place], Neoma Laming B, MD   4 months ago Chronic low back pain, unspecified back pain laterality, unspecified whether sciatica present   [place], Michelle P, NP   5 months ago COPD exacerbation [facility])   [place] Elsie Stain, MD   6 months ago Acute cystitis without hematuria   [place], [place]   8 months ago [place], [place], [place]

## 2021-07-27 ENCOUNTER — Other Ambulatory Visit: Payer: Self-pay

## 2021-07-31 ENCOUNTER — Ambulatory Visit (HOSPITAL_COMMUNITY): Payer: No Payment, Other | Admitting: Licensed Clinical Social Worker

## 2021-08-01 ENCOUNTER — Telehealth (HOSPITAL_COMMUNITY): Payer: Self-pay | Admitting: *Deleted

## 2021-08-01 ENCOUNTER — Other Ambulatory Visit: Payer: Self-pay

## 2021-08-01 ENCOUNTER — Other Ambulatory Visit (HOSPITAL_COMMUNITY): Payer: Self-pay | Admitting: Physician Assistant

## 2021-08-01 DIAGNOSIS — F411 Generalized anxiety disorder: Secondary | ICD-10-CM

## 2021-08-01 MED ORDER — CLONAZEPAM 0.5 MG PO TABS: 0.5000 mg | ORAL_TABLET | Freq: Two times a day (BID) | ORAL | 2 refills | Status: DC | PRN

## 2021-08-01 NOTE — Telephone Encounter (Signed)
Provider was contacted by Glory Buff. Olevia Bowens, RN regarding patient's medication refill. Patient's medication to be e-prescribed to preferred pharmacy.

## 2021-08-01 NOTE — Telephone Encounter (Signed)
Call yesterday pm from patient requesting her Klonopin. I briefly looked at her hx and due to late hour I told her I would review chart tomorrow and call her after conferring with the provider. I called her previous pharmacy which is [facility], she also called yesterday to ask that we change her pharmacy to [facility] place on [address] as she has recently moved. I spoke with Margarita Rana tech and she said she filled her Klonopin from Dr [facility] on 01/08/21, 03/09/21 and 05/19/21. The rx is out of refills now, and also more than 6 mo old so it naturally expires. I reviewed with pharm tech the other meds we prescribe for her and they dont have a record of them, those being Buspar, Sinequan Lexapro and Remeron. Will forward concern to her provider for him to decide how to go forward.

## 2021-08-01 NOTE — Progress Notes (Signed)
Provider was contacted by Glory Buff. Olevia Bowens, RN regarding medication management for this patient. Patient's clonazepam prescription to be e-prescribed to pharmacy of choice.

## 2021-08-08 ENCOUNTER — Telehealth (HOSPITAL_COMMUNITY): Payer: Self-pay | Admitting: *Deleted

## 2021-08-08 NOTE — Telephone Encounter (Signed)
Pt called requesting refill of the Klonopin 0.5 mg. Pt is seen at the [facility], number was given to that office. Writer did advise pt that it appears that the Rx was sent in on 08/01/21 with 2 refills to the [facility]  on [address]. Pt very appreciative. Writer encouraged pt to call [facility] for any remaking or further questions.

## 2021-08-15 ENCOUNTER — Telehealth: Payer: Self-pay

## 2021-08-15 DIAGNOSIS — G8929 Other chronic pain: Secondary | ICD-10-CM

## 2021-08-15 DIAGNOSIS — M75101 Unspecified rotator cuff tear or rupture of right shoulder, not specified as traumatic: Secondary | ICD-10-CM

## 2021-08-15 NOTE — Telephone Encounter (Signed)
Copied from [place] [id number]. Topic: General - Other >> Aug 15, 2021  2:08 PM Valere Dross wrote: Reason for CRM: Pt called in a little upset, stating her shoulder has been hurting, and had been seeing PCP for it, but she is stating she wants PCP to help her or send her somewhere to get surgery. Please advise. >> Aug 15, 2021  2:12 PM [place], Ander Purpura wrote: Faythe Ghee for referral or does pt need appt?

## 2021-08-15 NOTE — Telephone Encounter (Signed)
Returned pt call to get more information in regards to shoulder pain  Pt states her right shoulder has been hurting for a good while probably like 8 months. Pt states she has spoken with pcp about this issues and it is not getting any better but she can't deal with the pain. Pt states she is not able to lift with her right shoulder. Pt states sometime she pain is a 10/10.   Pt is requesting a referral to ortho

## 2021-08-16 NOTE — Telephone Encounter (Signed)
Contacted pt and made aware  

## 2021-08-20 ENCOUNTER — Other Ambulatory Visit: Payer: Self-pay

## 2021-08-21 ENCOUNTER — Other Ambulatory Visit: Payer: Self-pay

## 2021-08-24 ENCOUNTER — Ambulatory Visit (INDEPENDENT_AMBULATORY_CARE_PROVIDER_SITE_OTHER): Payer: Medicare Other | Admitting: Orthopaedic Surgery

## 2021-08-24 ENCOUNTER — Encounter: Payer: Self-pay | Admitting: Orthopaedic Surgery

## 2021-08-24 ENCOUNTER — Other Ambulatory Visit: Payer: Self-pay

## 2021-08-24 VITALS — BP 176/98 | HR 74 | Ht 61.5 in | Wt 142.0 lb

## 2021-08-24 DIAGNOSIS — M7581 Other shoulder lesions, right shoulder: Secondary | ICD-10-CM | POA: Diagnosis not present

## 2021-08-24 DIAGNOSIS — M7501 Adhesive capsulitis of right shoulder: Secondary | ICD-10-CM

## 2021-08-29 ENCOUNTER — Other Ambulatory Visit: Payer: Self-pay

## 2021-08-30 ENCOUNTER — Telehealth (INDEPENDENT_AMBULATORY_CARE_PROVIDER_SITE_OTHER): Payer: Medicare Other | Admitting: Physician Assistant

## 2021-08-30 ENCOUNTER — Other Ambulatory Visit: Payer: Self-pay

## 2021-08-30 ENCOUNTER — Encounter (HOSPITAL_COMMUNITY): Payer: Self-pay | Admitting: Physician Assistant

## 2021-08-30 DIAGNOSIS — F411 Generalized anxiety disorder: Secondary | ICD-10-CM

## 2021-08-30 DIAGNOSIS — F3342 Major depressive disorder, recurrent, in full remission: Secondary | ICD-10-CM | POA: Diagnosis not present

## 2021-08-30 DIAGNOSIS — G4700 Insomnia, unspecified: Secondary | ICD-10-CM

## 2021-08-30 MED ORDER — DOXEPIN HCL 50 MG PO CAPS
ORAL_CAPSULE | ORAL | 2 refills | Status: DC
  Filled 2021-08-30: qty 30, fill #0
  Filled 2021-09-25: qty 30, 30d supply, fill #0
  Filled 2021-11-07: qty 30, 30d supply, fill #1

## 2021-08-30 MED ORDER — BUSPIRONE HCL 15 MG PO TABS
ORAL_TABLET | ORAL | 2 refills | Status: DC
  Filled 2021-10-22: qty 120, 30d supply, fill #0

## 2021-08-30 MED ORDER — ESCITALOPRAM OXALATE 10 MG PO TABS
ORAL_TABLET | ORAL | 2 refills | Status: DC
  Filled 2021-08-30: qty 90, 30d supply, fill #0

## 2021-08-30 MED ORDER — MIRTAZAPINE 30 MG PO TABS
ORAL_TABLET | ORAL | 2 refills | Status: DC
  Filled 2021-08-30: qty 30, fill #0
  Filled 2021-09-25: qty 30, 30d supply, fill #0
  Filled 2021-11-07: qty 30, 30d supply, fill #1

## 2021-08-30 NOTE — Progress Notes (Addendum)
[place] MD/PA/NP OP Progress Note  Virtual Visit via Telephone Note  I connected with [name] [name] on 08/30/21 at  1:00 PM EST by telephone and verified that I am speaking with the correct person using two identifiers.  Location: Patient: Home Provider: Clinic   I discussed the limitations, risks, security and privacy concerns of performing an evaluation and management service by telephone and the availability of in person appointments. I also discussed with the patient that there may be a patient responsible charge related to this service. The patient expressed understanding and agreed to proceed.  Follow Up Instructions:   I discussed the assessment and treatment plan with the patient. The patient was provided an opportunity to ask questions and all were answered. The patient agreed with the plan and demonstrated an understanding of the instructions.   The patient was advised to call back or seek an in-person evaluation if the symptoms worsen or if the condition fails to improve as anticipated.  I provided 18 minutes of non-face-to-face time during this encounter.  Malachy Mood, PA   08/30/2021 8:58 PM [name] [name]  MRN:  [id number]  Chief Complaint:  Chief Complaint  Patient presents with   Follow-up   Medication Management   HPI:   [name] is a 66 year old female with a past psychiatric history significant for insomnia, generalized anxiety disorder, and major depressive disorder who presents to [facility] via virtual telephone visit for follow-up and medication management.  Patient is currently being managed on the following medications:  Doxepin (Sinequan) 50 mg at bedtime Buspirone 15 mg 3 times daily in the morning/1 tablet at noon/1 tablet in the evening Escitalopram 30 mg daily Clonazepam 0.5 mg 2 times daily as needed Mirtazapine 30 mg at bedtime  Patient reports no issues or concerns regarding her current medication  regimen.  Patient denies the need for dosage adjustments at this time and is requesting refills on all her medications following the conclusion of the encounter.  Patient reports that she recently underwent shoulder and bicep surgery and has been unable to lift certain items.  Despite her surgery, patient does not report any major issues or concerns.  Patient states that Klonopin has been especially helpful in the management of her nerves.  Patient denies the need for dosage adjustments at this time.  Patient reports that she is functioning well.  She states that her medications keep her from being down and that she is able to handle her mood accordingly.  Patient endorses elevated anxiety due to her recent arm/shoulder surgery.  A GAD-7 screen was performed with the patient scoring a 6.  Patient reports no other issues or concerns regarding her current medication regimen.  Patient is alert and oriented x4, calm, cooperative, and fully engaged in conversation during the encounter.  Patient endorses good mood.  Patient denies suicidal or homicidal ideations.  She further denies auditory or visual hallucinations and does not appear to be responding to internal/external stimuli.  Patient endorses mostly good sleep.  Patient states that she occasionally experiences waking up in the middle of the night.  Patient endorses good sleep and eats on average 2 meals per day.  Patient denies alcohol consumption and illicit drug use.  Patient endorses tobacco use and smokes on average a pack per day.  Visit Diagnosis:    ICD-10-CM   1. Major depressive disorder, recurrent, in full remission with anxious distress (HCC)  F33.42 busPIRone (BUSPAR) 15 MG tablet  doxepin (SINEQUAN) 50 MG capsule    escitalopram (LEXAPRO) 10 MG tablet    2. GAD (generalized anxiety disorder)  F41.1 busPIRone (BUSPAR) 15 MG tablet    escitalopram (LEXAPRO) 10 MG tablet    3. Insomnia, unspecified type  G47.00 mirtazapine (REMERON) 30 MG  tablet      Past Psychiatric History:  Insomnia Major depressive disorder Generalized anxiety disorder  Past Medical History:  Past Medical History:  Diagnosis Date   Allergy    seasonal   Anxiety    takes Lexapro daily   Arthritis    "back, from neck down pass my bra area" (03/25/2017)   Asthma    Bartholin gland cyst 08/29/2011   Bruises easily    pt is on Effient   Chronic back pain    herniated nucleus pulposus   Chronic back pain    "neck to bra area; lower back" (03/25/2017)   Chronic kidney disease    recurrent UTI's this year 2022   COPD (chronic obstructive pulmonary disease) (HCC)    early stages   Coronary artery disease    Depression    takes Klonopin daily   Diabetes mellitus without complication (HCC)    Diverticulosis    GERD (gastroesophageal reflux disease)    takes Nexium daily   H/O hiatal hernia    Heart attack ([place]) 2011   Hemorrhoids    Hernia    Hyperlipidemia    takes Lipitor daily   Hypertension    takes Losartan daily and Labetalol bid   Hypothyroidism    takes Synthroid daily   Insomnia    hydroxyzine prn   Joint pain    Pneumonia    "couple times" (03/25/2017)   Pre-diabetes    "just found out 1 wk ago" (03/25/2017)   Psoriasis    elbows,knees,back   Shortness of breath    with exertion   Slowing of urinary stream    Stress incontinence     Past Surgical History:  Procedure Laterality Date   [place]   "left one of my ovaries"   BACK SURGERY     BIOPSY  05/27/2019   Procedure: BIOPSY;  Surgeon: Daneil Dolin, MD;  [facility];  Service: Endoscopy;;   COLONOSCOPY  2011   Dr. Ardis Hughs: Mild diverticulosis, descending diminutive colon polyp (not retrieved), next colonoscopy 10 years   CORONARY ANGIOPLASTY WITH STENT PLACEMENT  2011 X2   "regular stents didn't work; had to go back in in ~ 1 month and put in medicated stents"   DILATION AND CURETTAGE OF  UTERUS     ESOPHAGOGASTRODUODENOSCOPY     ESOPHAGOGASTRODUODENOSCOPY (EGD) WITH PROPOFOL N/A 05/27/2019   normal esophagus, dilation, erosive gastropathy s/p biopsy, normal duodenum. Negative H.pylori.    LEFT HEART CATH AND CORONARY ANGIOGRAPHY N/A 03/26/2017   Procedure: LEFT HEART CATH AND CORONARY ANGIOGRAPHY;  Surgeon: Belva Crome, MD;  Location: [place] CV LAB;  Service: Cardiovascular;  Laterality: N/A;   LUMBAR LAMINECTOMY/DECOMPRESSION MICRODISCECTOMY  08/05/2011   Procedure: LUMBAR LAMINECTOMY/DECOMPRESSION MICRODISCECTOMY;  Surgeon: Otilio Connors, MD;  Location: [place] NEUR[facility] ORS;  Service: Neurosurgery;  Laterality: Right;  Right Lumbar four-five extraforaminal discectomy   MALONEY DILATION N/A 05/27/2019   Procedure: Venia Minks DILATION;  Surgeon: Daneil Dolin, MD;  [facility];  Service: Endoscopy;  Laterality: N/A;  4   TONSILLECTOMY     as a child   UPPER GASTROINTESTINAL ENDOSCOPY  Family Psychiatric History:  See intake H&P for full details. Reviewed, with no updates at this time.  Family History:  Family History  Problem Relation Age of Onset   Heart disease Mother    Hypertension Mother    Stroke Mother    Mental illness Mother    Heart disease Father    Hypertension Father    Diabetes Father    Colon polyps Brother    Breast cancer Maternal Aunt    Throat cancer Maternal Uncle    Thyroid disease Paternal Aunt    Heart disease Maternal Grandfather    Cancer Paternal Grandmother        mouth   Anesthesia problems Daughter    Hypotension Neg Hx    Malignant hyperthermia Neg Hx    Pseudochol deficiency Neg Hx    Colon cancer Neg Hx    Stomach cancer Neg Hx    Esophageal cancer Neg Hx    Pancreatic cancer Neg Hx    Rectal cancer Neg Hx     Social History:  Social History   Socioeconomic History   Marital status: Divorced    Spouse name: Not on file   Number of children: Not on file   Years of education: Not on file   Highest  education level: Not on file  Occupational History   Not on file  Tobacco Use   Smoking status: Every Day    Packs/day: 1.00    Years: 50.00    Pack years: 50.00    Types: Cigarettes   Smokeless tobacco: Never  Vaping Use   Vaping Use: Never used  Substance and Sexual Activity   Alcohol use: Yes    Comment: once a week   Drug use: No    Types: Cocaine    Comment: has been years per pt   Sexual activity: Not Currently    Birth control/protection: Surgical  Other Topics Concern   Not on file  Social History Narrative   Not on file   Social Determinants of Health   Financial Resource Strain: Not on file  Food Insecurity: Not on file  Transportation Needs: Not on file  Physical Activity: Not on file  Stress: Not on file  Social Connections: Not on file    Allergies:  Allergies  Allergen Reactions   Avocado Anaphylaxis   Latex Shortness Of Breath and Rash   Codeine Nausea Only    Metabolic Disorder Labs: Lab Results  Component Value Date   HGBA1C 6.0 (H) 11/24/2020   No results found for: PROLACTIN Lab Results  Component Value Date   CHOL 147 11/24/2020   TRIG 109 11/24/2020   HDL 58 11/24/2020   CHOLHDL 2.5 11/24/2020   VLDL 15 04/16/2016   LDLCALC 69 11/24/2020   LDLCALC 118 (H) 06/15/2020   Lab Results  Component Value Date   TSH 6.900 (H) 11/24/2020   TSH 5.880 (H) 06/15/2020    Therapeutic Level Labs: No results found for: LITHIUM No results found for: VALPROATE No components found for:  CBMZ  Current Medications: Current Outpatient Medications  Medication Sig Dispense Refill   albuterol (VENTOLIN HFA) 108 (90 Base) MCG/ACT inhaler INHALE 2 PUFFS INTO THE LUNGS EVERY 6 (SIX) HOURS AS NEEDED FOR WHEEZING OR SHORTNESS OF BREATH. 18 g 2   amLODipine (NORVASC) 5 MG tablet TAKE 1 TABLET (5 MG TOTAL) BY MOUTH DAILY. 90 tablet 1   aspirin EC 81 MG tablet Take 81 mg by mouth daily.     atorvastatin (  LIPITOR) 80 MG tablet Take 1 tablet (80 mg total)  by mouth daily. please schedule appointment for more refills 30 tablet 0   Blood Glucose Monitoring Suppl (TRUE METRIX METER) DEVI 1 kit by Does not apply route 4 (four) times daily. 1 Device 0   busPIRone (BUSPAR) 15 MG tablet TAKE 2 TABLETS IN THE MORNING, 1 TAB AT NOON AND 1 TAB IN THE EVENING 120 tablet 2   cetirizine (ZYRTEC) 10 MG tablet Take 10 mg by mouth as needed.     clonazePAM (KLONOPIN) 0.5 MG tablet Take 1 tablet (0.5 mg total) by mouth 2 (two) times daily as needed for anxiety. 60 tablet 2   doxepin (SINEQUAN) 50 MG capsule TAKE 1 CAPSULE (50 MG TOTAL) BY MOUTH AT BEDTIME. 30 capsule 2   doxycycline (VIBRA-TABS) 100 MG tablet Take 1 tablet (100 mg total) by mouth 2 (two) times daily. 14 tablet 0   escitalopram (LEXAPRO) 10 MG tablet TAKE 3 TABLETS (30 MG TOTAL) BY MOUTH DAILY. 90 tablet 2   Fluticasone-Umeclidin-Vilant (TRELEGY ELLIPTA) 100-62.5-25 MCG/ACT AEPB Inhale 1 puff into the lungs daily 60 each 2   furosemide (LASIX) 20 MG tablet Take 1 tablet (20 mg total) by mouth daily. 30 tablet 2   gabapentin (NEURONTIN) 600 MG tablet TAKE 1 TABLET BY MOUTH 4 TIMES DAILY 360 tablet 1   glucose blood (TRUE METRIX BLOOD GLUCOSE TEST) test strip Use as instructed 100 each 12   HYDROcodone-acetaminophen (NORCO/VICODIN) 5-325 MG tablet take 1 tablet by oral route  every 6 hours as needed for pain (Patient not taking: Reported on 08/24/2021) 28 tablet 0   ipratropium-albuterol (DUONEB) 0.5-2.5 (3) MG/3ML SOLN TAKE 3 MLS VIA NEBULIZATION EVERY 6 HOURS (Patient taking differently: TAKE 3 MLS VIA NEBULIZATION EVERY 6 HOURS as needed) 360 mL 1   levothyroxine (SYNTHROID) 100 MCG tablet TAKE 1 TABLET (100 MCG TOTAL) BY MOUTH DAILY. 30 tablet 6   losartan (COZAAR) 50 MG tablet TAKE 1 & 1/2 TABLETS BY MOUTH DAILY. 45 tablet 2   metFORMIN (GLUCOPHAGE) 500 MG tablet TAKE 1 TABLET (500 MG TOTAL) BY MOUTH 2 (TWO) TIMES DAILY WITH A MEAL. 60 tablet 3   methocarbamol (ROBAXIN) 500 MG tablet Take 1 tablet by  mouth 4 times daily 42 tablet 1   methocarbamol (ROBAXIN) 500 MG tablet Take 1 tablet (500 mg total) by mouth 2 (two) times daily as needed for muscle spasms. 60 tablet 1   mirtazapine (REMERON) 30 MG tablet TAKE 1 TABLET (30 MG TOTAL) BY MOUTH AT BEDTIME. 30 tablet 2   montelukast (SINGULAIR) 10 MG tablet TAKE 1 TABLET BY MOUTH AT BEDTIME. 30 tablet 2   nebivolol (BYSTOLIC) 10 MG tablet TAKE 1 TABLET (10 MG TOTAL) BY MOUTH DAILY. 90 tablet 1   nitroGLYCERIN (NITROSTAT) 0.4 MG SL tablet Place 1 tablet (0.4 mg total) under the tongue every 5 (five) minutes as needed for chest pain. 90 tablet 3   omeprazole (PRILOSEC) 40 MG capsule TAKE 1 CAPSULE (40 MG TOTAL) BY MOUTH 2 (TWO) TIMES DAILY BEFORE A MEAL. 120 capsule 0   predniSONE (DELTASONE) 10 MG tablet Take 3 tabs by mouth daily for 3 days then 2 tabs daily for 3 days then 1 tab daily for 3 days 18 tablet 0   promethazine-dextromethorphan (PROMETHAZINE-DM) 6.25-15 MG/5ML syrup Take 5 mLs by mouth 4 (four) times daily as needed for cough. 180 mL 0   sucralfate (CARAFATE) 1 g tablet Take 1 tablet (1 g total) by mouth 4 (  four) times daily -  with meals and at bedtime. 120 tablet 0   ticagrelor (BRILINTA) 60 MG TABS tablet TAKE 1 TABLET (60 MG TOTAL) BY MOUTH 2 (TWO) TIMES DAILY. 60 tablet 5   traMADol (ULTRAM) 50 MG tablet Take 1 tablet (50 mg total) by mouth every 6 (six) hours as needed. (Patient not taking: Reported on 08/24/2021) 10 tablet 0   triamcinolone cream (KENALOG) 0.1 % APPLY 1 APPLICATION TOPICALLY 2 TIMES DAILY. 30 g 1   TRUEPLUS LANCETS 28G MISC 28 g by Does not apply route QID. 120 each 2   Vitamin E 180 MG CAPS Take 180 mg by mouth daily.     No current facility-administered medications for this visit.     Musculoskeletal: Strength & Muscle Tone: Unable to assess due to telemedicine visit [place]: Unable to assess due to telemedicine visit Patient leans: Unable to assess due to telemedicine visit  Psychiatric Specialty  Exam: Review of Systems  Psychiatric/Behavioral:  Negative for decreased concentration, dysphoric mood, hallucinations, self-injury, sleep disturbance and suicidal ideas. The patient is not nervous/anxious and is not hyperactive.    There were no vitals taken for this visit.There is no height or weight on file to calculate BMI.  General Appearance: Unable to assess due to telemedicine visit  Eye Contact:  Unable to assess due to telemedicine visit  Speech:  Clear and Coherent and Normal Rate  Volume:  Normal  Mood:  Euthymic  Affect:  Appropriate  Thought Process:  Coherent, Goal Directed, and Descriptions of Associations: Intact  Orientation:  Full (Time, Place, and Person)  Thought Content: WDL   Suicidal Thoughts:  No  Homicidal Thoughts:  No  Memory:  Immediate;   Good Recent;   Good Remote;   Good  Judgement:  Fair  Insight:  Fair  Psychomotor Activity:  Normal  Concentration:  Concentration: Good and Attention Span: Good  Recall:  [employer] of Knowledge: Fair  Language: Fair  Akathisia:  Negative  Handed:  Right  AIMS (if indicated): not done  Assets:  Communication Skills Desire for Improvement Housing  ADL's:  Intact  Cognition: WNL  Sleep:  Fair   Screenings: AUDIT    Flowsheet Row Admission (Discharged) from 07/10/2013 in [facility]  Alcohol Use Disorder Identification Test Final Score (AUDIT) 12      GAD-7    Flowsheet Row Video Visit from 08/30/2021 in [facility] Office Visit from 05/31/2021 in [facility] Office Visit from 11/07/2020 in [place] [facility] Office Visit from 06/15/2020 in [place] from 01/20/2020 in [place]  Total GAD-7 Score '6 21 4 14 ' 0      PHQ2-9    Flowsheet Row Video Visit from 08/30/2021 in [facility] Office Visit from 05/31/2021 in [facility] Office Visit from 12/06/2020 in [facility] Visit from 11/07/2020 in [place] [facility] Office Visit from 06/15/2020 in [place]  PHQ-2 Total Score 0 3 0 1 3  PHQ-9 Total Score -- 17 0 7 9      Flowsheet Row Video Visit from 08/30/2021 in [facility] ED from 07/04/2021 in [place] DEPT Office Visit from 05/31/2021 in [place] No Risk No Risk No Risk  Assessment and Plan:   [name] [name] is a 66 year old female with a past psychiatric history significant for insomnia, generalized anxiety disorder, and major depressive disorder who presents to [facility] via virtual telephone visit for follow-up and medication management.  Patient reports no issues or concerns regarding her current medication regimen.  Patient states that she recently had shoulder and bicep surgery and attributes her recent surgery to her elevated anxiety.  Despite her anxiety, patient states that her medications have been managing her symptoms well enough.  Patient is requesting refills on all her medications following the conclusion of the encounter.  Patient's medications to be e-prescribed to pharmacy of choice.  Collaboration of Care: Collaboration of Care: Medication Management AEB patient's medications being managed by this provider and Psychiatrist AEB patient being managed by this behavioral health provider  Patient/Guardian was advised Release of Information must be obtained prior to any record release in order to collaborate their care with an outside provider. Patient/Guardian was advised if they have not already done so to contact the registration department to sign all necessary forms in order for [place] to release information regarding their care.   Consent:  Patient/Guardian gives verbal consent for treatment and assignment of benefits for services provided during this visit. Patient/Guardian expressed understanding and agreed to proceed.   1. Major depressive disorder, recurrent, in full remission with anxious distress (HCC)  - busPIRone (BUSPAR) 15 MG tablet; TAKE 2 TABLETS IN THE MORNING, 1 TAB AT NOON AND 1 TAB IN THE EVENING  Dispense: 120 tablet; Refill: 2 - doxepin (SINEQUAN) 50 MG capsule; TAKE 1 CAPSULE (50 MG TOTAL) BY MOUTH AT BEDTIME.  Dispense: 30 capsule; Refill: 2 - escitalopram (LEXAPRO) 10 MG tablet; TAKE 3 TABLETS (30 MG TOTAL) BY MOUTH DAILY.  Dispense: 90 tablet; Refill: 2  2. GAD (generalized anxiety disorder)  - busPIRone (BUSPAR) 15 MG tablet; TAKE 2 TABLETS IN THE MORNING, 1 TAB AT NOON AND 1 TAB IN THE EVENING  Dispense: 120 tablet; Refill: 2 - escitalopram (LEXAPRO) 10 MG tablet; TAKE 3 TABLETS (30 MG TOTAL) BY MOUTH DAILY.  Dispense: 90 tablet; Refill: 2  3. Insomnia, unspecified type  - mirtazapine (REMERON) 30 MG tablet; TAKE 1 TABLET (30 MG TOTAL) BY MOUTH AT BEDTIME.  Dispense: 30 tablet; Refill: 2  Patient to follow up in 3 months Provider spent a total of 18 minutes with the patient/reviewing patient's chart  Malachy Mood, PA 08/30/2021, 8:58 PM

## 2021-09-03 ENCOUNTER — Other Ambulatory Visit: Payer: Self-pay

## 2021-09-04 ENCOUNTER — Other Ambulatory Visit: Payer: Self-pay

## 2021-09-04 NOTE — Progress Notes (Signed)
Office Visit Note   Patient: [name] [name]           Date of Birth: 10/29/1955           MRN: [id number] Visit Date: 08/24/2021              Requested by: Ladell Pier, MD [address] [place],  [place] [id number] PCP: Ladell Pier, MD   Assessment & Plan: Visit Diagnoses:  1. Rotator cuff tendinitis, right   2. Adhesive capsulitis of right shoulder   3.     Biceps tendonosis, long head  Plan: She can continue to work on range of motion exercises to increase range of motion.  We discussed  shoulder arthroscopy of the right shoulder and biceps tendinosis with arthroscopic release of the shoulder.  She understands this could be done as an outpatient.  Postop rehab was discussed.  Follow-Up Instructions: No follow-ups on file.   Orders:  No orders of the defined types were placed in this encounter.  No orders of the defined types were placed in this encounter.     Procedures: No procedures performed   Clinical Data: No additional findings.   Subjective: Chief Complaint  Patient presents with   Right Shoulder - Pain    HPI 66 year old female history of bipolar disorder returns with ongoing problems with right shoulder with some rotator cuff mild tendinopathy and intra-articular biceps tendinopathy.  She had previous injection which did not seem to give her sustained relief.  She thinks she did get a little bit more mobility.  She is used Aleve with slight relief but states it bothers her stomach.  She has been doing home exercises trying to increase her range of motion.  Physical therapy for her shoulder and giving her some improvement in range of motion.  Patient had cervical MRI last fall which showed some spondylosis at C6-7 with disc osteophyte complex on the right with paracentral disc protrusion and small central disc protrusion at C4-5 and C5-6.  Nonsurgical management per Dr. Arnoldo Morale who is continue to follow her neck.  Review of Systems positive smoking  COPD bipolar disorder asthma, hypertension.   Objective: Vital Signs: BP (!) 176/98    Pulse 74    Ht 5' 1.5" (1.562 m)    Wt 142 lb (64.4 kg)    BMI 26.40 kg/m   Physical Exam Constitutional:      Appearance: She is well-developed.  HENT:     Head: Normocephalic.     Right Ear: External ear normal.     Left Ear: External ear normal. There is no impacted cerumen.  Eyes:     Pupils: Pupils are equal, round, and reactive to light.  Neck:     Thyroid: No thyromegaly.     Trachea: No tracheal deviation.  Cardiovascular:     Rate and Rhythm: Normal rate.  Pulmonary:     Effort: Pulmonary effort is normal.  Abdominal:     Palpations: Abdomen is soft.  Musculoskeletal:     Cervical back: No rigidity.  Skin:    General: Skin is warm and dry.  Neurological:     Mental Status: She is alert and oriented to person, place, and time.  Psychiatric:        Behavior: Behavior normal.    Ortho Exam patient has mild brachial plexus tenderness on the right.  Upper extremity reflexes are 2+ and symmetrical.  She abducts 90 degrees.  Long head of the biceps is  tender.  Station hand is intact.  Good grip strength biceps triceps is normal.  Specialty Comments:  No specialty comments available.  Imaging: CLINICAL DATA:  Right shoulder pain for 2 months   EXAM: MRI OF THE RIGHT SHOULDER WITHOUT CONTRAST   TECHNIQUE: Multiplanar, multisequence MR imaging of the shoulder was performed. No intravenous contrast was administered.   COMPARISON:  None.   FINDINGS: Rotator cuff: Mild tendinosis of the supraspinatus tendon with a small partial-thickness bursal surface tear along the anterior-most aspect. Mild tendinosis of the infraspinatus tendon. Teres minor tendon is intact. Mild tendinosis of the subscapularis tendon with subcortical reactive marrow changes.   Muscles: No muscle atrophy or edema. No intramuscular fluid collection or hematoma.   Biceps Long Head: Mild tendinosis of the  intra-articular portion of the long head of the biceps tendon.   Acromioclavicular Joint: Moderate arthropathy of the acromioclavicular joint. No subacromial/subdeltoid bursal fluid.   Glenohumeral Joint: No joint effusion. No chondral defect.   Labrum: Superior labral degeneration with a tear   Bones: No fracture or dislocation. No aggressive osseous lesion.   Other: No fluid collection or hematoma.   IMPRESSION: 1. Mild tendinosis of the supraspinatus tendon with a small partial-thickness bursal surface tear along the anterior-most aspect. 2. Mild tendinosis of the infraspinatus tendon. 3. Mild tendinosis of the subscapularis tendon with subcortical reactive marrow changes.     Electronically Signed   By: Kathreen Devoid M.D.   On: 04/21/2021 15:58   PMFS History: Patient Active Problem List   Diagnosis Date Noted   Major depressive disorder, recurrent, in full remission with anxious distress ([place]) 05/31/2021   Rotator cuff tendinitis, right 05/08/2021   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   Chronic neck pain 02/26/2021   GAD (generalized anxiety disorder) 01/08/2021   Acute cystitis without hematuria 11/07/2020   Influenza vaccine needed 06/15/2020   Abdominal pain, epigastric 04/28/2019   Esophageal dysphagia 04/28/2019   Bipolar I disorder ([place]) 09/17/2018   Insomnia 10/28/2017   Prediabetes 05/28/2017   QT prolongation 03/25/2017   Encounter for screening mammogram for breast cancer 09/11/2015   Psoriasis 06/22/2015   COPD (chronic obstructive pulmonary disease) (HCC)GOLD C  05/18/2015   Anxiety and depression 07/10/2013   Essential hypertension, benign 06/07/2013   Dyslipidemia 06/07/2013   Asthma, chronic 06/07/2013   Emphysema lung ([place]) 06/07/2013   Chronic back pain 06/07/2013   CAD S/P percutaneous coronary angioplasty 06/07/2013   GERD (gastroesophageal reflux disease) 06/07/2013   Breast lump on left side at 3 o'clock position 10/13/2012    S/P abdominal hysterectomy and right salpingo-oophorectomy 08/29/2011   COPD exacerbation ([place]) 09/15/2009   TOBACCO ABUSE 07/17/2009   Chronic rhinitis 07/17/2009   Lung nodule < 6cm on CT 07/17/2009   ALLERGY, FOOD 07/17/2009   Hypothyroidism 12/22/2008   Past Medical History:  Diagnosis Date   Allergy    seasonal   Anxiety    takes Lexapro daily   Arthritis    "back, from neck down pass my bra area" (03/25/2017)   Asthma    Bartholin gland cyst 08/29/2011   Bruises easily    pt is on Effient   Chronic back pain    herniated nucleus pulposus   Chronic back pain    "neck to bra area; lower back" (03/25/2017)   Chronic kidney disease    recurrent UTI's this year 2022   COPD (chronic obstructive pulmonary disease) (HCC)    early stages   Coronary artery disease  Depression    takes Klonopin daily   Diabetes mellitus without complication ([place])    Diverticulosis    GERD (gastroesophageal reflux disease)    takes Nexium daily   H/O hiatal hernia    Heart attack ([place]) 2011   Hemorrhoids    Hernia    Hyperlipidemia    takes Lipitor daily   Hypertension    takes Losartan daily and Labetalol bid   Hypothyroidism    takes Synthroid daily   Insomnia    hydroxyzine prn   Joint pain    Pneumonia    "couple times" (03/25/2017)   Pre-diabetes    "just found out 1 wk ago" (03/25/2017)   Psoriasis    elbows,knees,back   Shortness of breath    with exertion   Slowing of urinary stream    Stress incontinence     Family History  Problem Relation Age of Onset   Heart disease Mother    Hypertension Mother    Stroke Mother    Mental illness Mother    Heart disease Father    Hypertension Father    Diabetes Father    Colon polyps Brother    Breast cancer Maternal Aunt    Throat cancer Maternal Uncle    Thyroid disease Paternal Aunt    Heart disease Maternal Grandfather    Cancer Paternal Grandmother        mouth   Anesthesia problems Daughter    Hypotension Neg Hx     Malignant hyperthermia Neg Hx    Pseudochol deficiency Neg Hx    Colon cancer Neg Hx    Stomach cancer Neg Hx    Esophageal cancer Neg Hx    Pancreatic cancer Neg Hx    Rectal cancer Neg Hx     Past Surgical History:  Procedure Laterality Date   ABDOMINAL EXPLORATION SURGERY  1977   ABDOMINAL HYSTERECTOMY  1977   "left one of my ovaries"   BACK SURGERY     BIOPSY  05/27/2019   Procedure: BIOPSY;  Surgeon: Daneil Dolin, MD;  Location: [facility];  Service: Endoscopy;;   COLONOSCOPY  2011   Dr. Ardis Hughs: Mild diverticulosis, descending diminutive colon polyp (not retrieved), next colonoscopy 10 years   CORONARY ANGIOPLASTY WITH STENT PLACEMENT  2011 X2   "regular stents didn't work; had to go back in in ~ 1 month and put in medicated stents"   DILATION AND CURETTAGE OF UTERUS     ESOPHAGOGASTRODUODENOSCOPY     ESOPHAGOGASTRODUODENOSCOPY (EGD) WITH PROPOFOL N/A 05/27/2019   normal esophagus, dilation, erosive gastropathy s/p biopsy, normal duodenum. Negative H.pylori.    LEFT HEART CATH AND CORONARY ANGIOGRAPHY N/A 03/26/2017   Procedure: LEFT HEART CATH AND CORONARY ANGIOGRAPHY;  Surgeon: Belva Crome, MD;  Location: [facility];  Service: Cardiovascular;  Laterality: N/A;   LUMBAR LAMINECTOMY/DECOMPRESSION MICRODISCECTOMY  08/05/2011   Procedure: LUMBAR LAMINECTOMY/DECOMPRESSION MICRODISCECTOMY;  Surgeon: Otilio Connors, MD;  Location: [facility];  Service: Neurosurgery;  Laterality: Right;  Right Lumbar four-five extraforaminal discectomy   MALONEY DILATION N/A 05/27/2019   Procedure: Venia Minks DILATION;  Surgeon: Daneil Dolin, MD;  Location: [facility];  Service: Endoscopy;  Laterality: N/A;  12   TONSILLECTOMY     as a child   UPPER GASTROINTESTINAL ENDOSCOPY     Social History   Occupational History   Not on file  Tobacco Use   Smoking status: Every Day    Packs/day: 1.00    Years:  50.00    Pack years: 50.00    Types: Cigarettes   Smokeless  tobacco: Never  Vaping Use   Vaping Use: Never used  Substance and Sexual Activity   Alcohol use: Yes    Comment: once a week   Drug use: No    Types: Cocaine    Comment: has been years per pt   Sexual activity: Not Currently    Birth control/protection: Surgical

## 2021-09-05 ENCOUNTER — Other Ambulatory Visit: Payer: Self-pay

## 2021-09-10 ENCOUNTER — Other Ambulatory Visit: Payer: Self-pay

## 2021-09-14 ENCOUNTER — Other Ambulatory Visit: Payer: Self-pay

## 2021-09-14 ENCOUNTER — Ambulatory Visit: Payer: Medicare Other | Attending: Internal Medicine | Admitting: Internal Medicine

## 2021-09-14 ENCOUNTER — Other Ambulatory Visit: Payer: Self-pay | Admitting: Internal Medicine

## 2021-09-14 ENCOUNTER — Encounter: Payer: Self-pay | Admitting: Internal Medicine

## 2021-09-14 VITALS — BP 121/74 | HR 69 | Resp 16 | Ht 61.5 in | Wt 140.6 lb

## 2021-09-14 DIAGNOSIS — J449 Chronic obstructive pulmonary disease, unspecified: Secondary | ICD-10-CM

## 2021-09-14 DIAGNOSIS — E039 Hypothyroidism, unspecified: Secondary | ICD-10-CM | POA: Insufficient documentation

## 2021-09-14 DIAGNOSIS — F17200 Nicotine dependence, unspecified, uncomplicated: Secondary | ICD-10-CM | POA: Diagnosis not present

## 2021-09-14 DIAGNOSIS — I10 Essential (primary) hypertension: Secondary | ICD-10-CM | POA: Diagnosis not present

## 2021-09-14 DIAGNOSIS — K219 Gastro-esophageal reflux disease without esophagitis: Secondary | ICD-10-CM | POA: Insufficient documentation

## 2021-09-14 DIAGNOSIS — Z955 Presence of coronary angioplasty implant and graft: Secondary | ICD-10-CM | POA: Diagnosis not present

## 2021-09-14 DIAGNOSIS — Z23 Encounter for immunization: Secondary | ICD-10-CM | POA: Insufficient documentation

## 2021-09-14 DIAGNOSIS — Z01818 Encounter for other preprocedural examination: Secondary | ICD-10-CM | POA: Diagnosis not present

## 2021-09-14 DIAGNOSIS — R7303 Prediabetes: Secondary | ICD-10-CM | POA: Insufficient documentation

## 2021-09-14 DIAGNOSIS — Z7901 Long term (current) use of anticoagulants: Secondary | ICD-10-CM | POA: Insufficient documentation

## 2021-09-14 DIAGNOSIS — I2510 Atherosclerotic heart disease of native coronary artery without angina pectoris: Secondary | ICD-10-CM | POA: Diagnosis not present

## 2021-09-14 DIAGNOSIS — F411 Generalized anxiety disorder: Secondary | ICD-10-CM | POA: Insufficient documentation

## 2021-09-14 DIAGNOSIS — F17210 Nicotine dependence, cigarettes, uncomplicated: Secondary | ICD-10-CM | POA: Diagnosis not present

## 2021-09-14 DIAGNOSIS — Z79899 Other long term (current) drug therapy: Secondary | ICD-10-CM | POA: Insufficient documentation

## 2021-09-14 DIAGNOSIS — E1165 Type 2 diabetes mellitus with hyperglycemia: Secondary | ICD-10-CM

## 2021-09-14 DIAGNOSIS — Z1231 Encounter for screening mammogram for malignant neoplasm of breast: Secondary | ICD-10-CM | POA: Diagnosis not present

## 2021-09-14 DIAGNOSIS — Z7984 Long term (current) use of oral hypoglycemic drugs: Secondary | ICD-10-CM | POA: Diagnosis not present

## 2021-09-14 DIAGNOSIS — J439 Emphysema, unspecified: Secondary | ICD-10-CM | POA: Insufficient documentation

## 2021-09-14 DIAGNOSIS — F3342 Major depressive disorder, recurrent, in full remission: Secondary | ICD-10-CM

## 2021-09-14 LAB — POCT GLYCOSYLATED HEMOGLOBIN (HGB A1C): HbA1c, POC (controlled diabetic range): 5.7 % (ref 0.0–7.0)

## 2021-09-14 MED ORDER — ZOSTER VAC RECOMB ADJUVANTED 50 MCG/0.5ML IM SUSR: 0.5000 mL | Freq: Once | INTRAMUSCULAR | 1 refills | Status: DC

## 2021-09-14 NOTE — Patient Instructions (Addendum)
Stop Brilinta 7 days prior to surgery.  ?

## 2021-09-14 NOTE — Progress Notes (Signed)
Patient ID: [name] [name], female    DOB: [birth year]  MRN: [id number]  CC: Pre-op Exam, COPD, and Diabetes   Subjective: [name] [name] is a 66 y.o. female who presents for pre-op eval Her concerns today include:  Patient with history of pre-DM, GERD, hypothyroidism, HTN, HL, CAD with previous stent, COPD, substance abuse, depression/anxiety and tobacco dependence  Patient will be having arthroscopic surgery done on the right shoulder by Dr. Lorin Mercy under general anesthesia. Reports having had surgeries in past under general anesthesia. No problems waking up from anesthesia Hx of CAD/HTN.  Reports compliance with taking her medications including amlodipine, aspirin, furosemide, Cozaar, Bystolic, Brilinta -no CP with rest or exertion.  No recent use of SL Nitro.  She helps her friend clean a house once a wk.  She also cleans her daughter's house once a week.  She pushes the vacuum and denies any chest pains with doing that.  She can walk about 1 block before having to rest due to her COPD. No LE edema, palpitations, no PND/orthopnea -Had nuclear ST 11/2019 that was low risk study.  EF on that study was 69%.  COPD:  no flare since Dec 2022.  She has not been on prednisone since that time. Reports compliance with Trelegy.  Uses Albuterol occasionally or if she has to do a lot of bending over.  Pt not on oxygen.  Still smoking 1 pk/day.  Not ready to quit.   Hypothyroid: compliant with Levothyroxine  Dep/Anx:  plugged into [facility].  Doing well on Lexapro, BuSpar, clonazepam, doxepin.  Reports that her depression is in remission on these medications.  PreDM Results for orders placed or performed in visit on 09/14/21  POCT glycosylated hemoglobin (Hb A1C)  Result Value Ref Range   Hemoglobin A1C     HbA1c POC (<> result, manual entry)     HbA1c, POC (prediabetic range)     HbA1c, POC (controlled diabetic range) 5.7 0.0 - 7.0 %  Taking Metformin as prescribed She feels she does well with  her eating habits.  Patient Active Problem List   Diagnosis Date Noted   Major depressive disorder, recurrent, in full remission with anxious distress ([place]) 05/31/2021   Rotator cuff tendinitis, right 05/08/2021   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   Chronic neck pain 02/26/2021   GAD (generalized anxiety disorder) 01/08/2021   Acute cystitis without hematuria 11/07/2020   Influenza vaccine needed 06/15/2020   Abdominal pain, epigastric 04/28/2019   Esophageal dysphagia 04/28/2019   Bipolar I disorder ([place]) 09/17/2018   Insomnia 10/28/2017   Prediabetes 05/28/2017   QT prolongation 03/25/2017   Encounter for screening mammogram for breast cancer 09/11/2015   Psoriasis 06/22/2015   COPD (chronic obstructive pulmonary disease) (HCC)GOLD C  05/18/2015   Anxiety and depression 07/10/2013   Essential hypertension, benign 06/07/2013   Dyslipidemia 06/07/2013   Asthma, chronic 06/07/2013   Emphysema lung ([place]) 06/07/2013   Chronic back pain 06/07/2013   CAD S/P percutaneous coronary angioplasty 06/07/2013   GERD (gastroesophageal reflux disease) 06/07/2013   Breast lump on left side at 3 o'clock position 10/13/2012   S/P abdominal hysterectomy and right salpingo-oophorectomy 08/29/2011   COPD exacerbation ([place]) 09/15/2009   TOBACCO ABUSE 07/17/2009   Chronic rhinitis 07/17/2009   Lung nodule < 6cm on CT 07/17/2009   ALLERGY, FOOD 07/17/2009   Hypothyroidism 12/22/2008     Current Outpatient Medications on File Prior to Visit  Medication Sig Dispense Refill   albuterol (VENTOLIN  HFA) 108 (90 Base) MCG/ACT inhaler INHALE 2 PUFFS INTO THE LUNGS EVERY 6 (SIX) HOURS AS NEEDED FOR WHEEZING OR SHORTNESS OF BREATH. 18 g 2   amLODipine (NORVASC) 5 MG tablet TAKE 1 TABLET (5 MG TOTAL) BY MOUTH DAILY. 90 tablet 1   aspirin EC 81 MG tablet Take 81 mg by mouth daily.     atorvastatin (LIPITOR) 80 MG tablet Take 1 tablet (80 mg total) by mouth daily. please schedule appointment  for more refills 30 tablet 0   Blood Glucose Monitoring Suppl (TRUE METRIX METER) DEVI 1 kit by Does not apply route 4 (four) times daily. 1 Device 0   busPIRone (BUSPAR) 15 MG tablet TAKE 2 TABLETS IN THE MORNING, 1 TAB AT NOON AND 1 TAB IN THE EVENING 120 tablet 2   clonazePAM (KLONOPIN) 0.5 MG tablet Take 1 tablet (0.5 mg total) by mouth 2 (two) times daily as needed for anxiety. 60 tablet 2   doxepin (SINEQUAN) 50 MG capsule TAKE 1 CAPSULE (50 MG TOTAL) BY MOUTH AT BEDTIME. 30 capsule 2   escitalopram (LEXAPRO) 10 MG tablet TAKE 3 TABLETS (30 MG TOTAL) BY MOUTH DAILY. 90 tablet 2   Fluticasone-Umeclidin-Vilant (TRELEGY ELLIPTA) 100-62.5-25 MCG/ACT AEPB Inhale 1 puff into the lungs daily 60 each 2   furosemide (LASIX) 20 MG tablet Take 1 tablet (20 mg total) by mouth daily. 30 tablet 2   gabapentin (NEURONTIN) 600 MG tablet TAKE 1 TABLET BY MOUTH 4 TIMES DAILY 360 tablet 1   glucose blood (TRUE METRIX BLOOD GLUCOSE TEST) test strip Use as instructed 100 each 12   ipratropium-albuterol (DUONEB) 0.5-2.5 (3) MG/3ML SOLN TAKE 3 MLS VIA NEBULIZATION EVERY 6 HOURS (Patient taking differently: TAKE 3 MLS VIA NEBULIZATION EVERY 6 HOURS as needed) 360 mL 1   levothyroxine (SYNTHROID) 100 MCG tablet TAKE 1 TABLET (100 MCG TOTAL) BY MOUTH DAILY. 30 tablet 6   losartan (COZAAR) 50 MG tablet TAKE 1 & 1/2 TABLETS BY MOUTH DAILY. 45 tablet 2   metFORMIN (GLUCOPHAGE) 500 MG tablet TAKE 1 TABLET (500 MG TOTAL) BY MOUTH 2 (TWO) TIMES DAILY WITH A MEAL. 60 tablet 3   methocarbamol (ROBAXIN) 500 MG tablet Take 1 tablet by mouth 4 times daily 42 tablet 1   methocarbamol (ROBAXIN) 500 MG tablet Take 1 tablet (500 mg total) by mouth 2 (two) times daily as needed for muscle spasms. 60 tablet 1   mirtazapine (REMERON) 30 MG tablet TAKE 1 TABLET (30 MG TOTAL) BY MOUTH AT BEDTIME. 30 tablet 2   montelukast (SINGULAIR) 10 MG tablet TAKE 1 TABLET BY MOUTH AT BEDTIME. 30 tablet 2   nebivolol (BYSTOLIC) 10 MG tablet TAKE 1  TABLET (10 MG TOTAL) BY MOUTH DAILY. 90 tablet 1   nitroGLYCERIN (NITROSTAT) 0.4 MG SL tablet Place 1 tablet (0.4 mg total) under the tongue every 5 (five) minutes as needed for chest pain. 90 tablet 3   omeprazole (PRILOSEC) 40 MG capsule TAKE 1 CAPSULE (40 MG TOTAL) BY MOUTH 2 (TWO) TIMES DAILY BEFORE A MEAL. 120 capsule 0   promethazine-dextromethorphan (PROMETHAZINE-DM) 6.25-15 MG/5ML syrup Take 5 mLs by mouth 4 (four) times daily as needed for cough. (Patient not taking: Reported on 09/14/2021) 180 mL 0   ticagrelor (BRILINTA) 60 MG TABS tablet TAKE 1 TABLET (60 MG TOTAL) BY MOUTH 2 (TWO) TIMES DAILY. 60 tablet 5   TRUEPLUS LANCETS 28G MISC 28 g by Does not apply route QID. 120 each 2   Vitamin E 180 MG CAPS  Take 180 mg by mouth daily.     No current facility-administered medications on file prior to visit.    Allergies  Allergen Reactions   Avocado Anaphylaxis   Latex Shortness Of Breath and Rash   Codeine Nausea Only    Social History   Socioeconomic History   Marital status: Divorced    Spouse name: Not on file   Number of children: Not on file   Years of education: Not on file   Highest education level: Not on file  Occupational History   Not on file  Tobacco Use   Smoking status: Every Day    Packs/day: 1.00    Years: 50.00    Pack years: 50.00    Types: Cigarettes   Smokeless tobacco: Never  Vaping Use   Vaping Use: Never used  Substance and Sexual Activity   Alcohol use: Yes    Comment: once a week   Drug use: No    Types: Cocaine    Comment: has been years per pt   Sexual activity: Not Currently    Birth control/protection: Surgical  Other Topics Concern   Not on file  Social History Narrative   Not on file   Social Determinants of Health   Financial Resource Strain: Not on file  Food Insecurity: Not on file  Transportation Needs: Not on file  Physical Activity: Not on file  Stress: Not on file  Social Connections: Not on file  Intimate Partner  Violence: Not on file    Family History  Problem Relation Age of Onset   Heart disease Mother    Hypertension Mother    Stroke Mother    Mental illness Mother    Heart disease Father    Hypertension Father    Diabetes Father    Colon polyps Brother    Breast cancer Maternal Aunt    Throat cancer Maternal Uncle    Thyroid disease Paternal Aunt    Heart disease Maternal Grandfather    Cancer Paternal Grandmother        mouth   Anesthesia problems Daughter    Hypotension Neg Hx    Malignant hyperthermia Neg Hx    Pseudochol deficiency Neg Hx    Colon cancer Neg Hx    Stomach cancer Neg Hx    Esophageal cancer Neg Hx    Pancreatic cancer Neg Hx    Rectal cancer Neg Hx     Past Surgical History:  Procedure Laterality Date   ABDOMINAL EXPLORATION SURGERY  1977   ABDOMINAL HYSTERECTOMY  1977   "left one of my ovaries"   BACK SURGERY     BIOPSY  05/27/2019   Procedure: BIOPSY;  Surgeon: Daneil Dolin, MD;  [facility];  Service: Endoscopy;;   COLONOSCOPY  2011   Dr. Ardis Hughs: Mild diverticulosis, descending diminutive colon polyp (not retrieved), next colonoscopy 10 years   CORONARY ANGIOPLASTY WITH STENT PLACEMENT  2011 X2   "regular stents didn't work; had to go back in in ~ 1 month and put in medicated stents"   DILATION AND CURETTAGE OF UTERUS     ESOPHAGOGASTRODUODENOSCOPY     ESOPHAGOGASTRODUODENOSCOPY (EGD) WITH PROPOFOL N/A 05/27/2019   normal esophagus, dilation, erosive gastropathy s/p biopsy, normal duodenum. Negative H.pylori.    LEFT HEART CATH AND CORONARY ANGIOGRAPHY N/A 03/26/2017   Procedure: LEFT HEART CATH AND CORONARY ANGIOGRAPHY;  Surgeon: Belva Crome, MD;  Location: [place] [facility];  Service: Cardiovascular;  Laterality: N/A;  LUMBAR LAMINECTOMY/DECOMPRESSION MICRODISCECTOMY  08/05/2011   Procedure: LUMBAR LAMINECTOMY/DECOMPRESSION MICRODISCECTOMY;  Surgeon: Otilio Connors, MD;  Location: [place] [facility];  Service: Neurosurgery;   Laterality: Right;  Right Lumbar four-five extraforaminal discectomy   MALONEY DILATION N/A 05/27/2019   Procedure: Venia Minks DILATION;  Surgeon: Daneil Dolin, MD;  Location: [facility];  Service: Endoscopy;  Laterality: N/A;  54   TONSILLECTOMY     as a child   UPPER GASTROINTESTINAL ENDOSCOPY      ROS: Review of Systems Negative except as stated above  PHYSICAL EXAM: BP 121/74    Pulse 69    Resp 16    Ht 5' 1.5" (1.562 m)    Wt 140 lb 9.6 oz (63.8 kg)    SpO2 97%    BMI 26.14 kg/m   Wt Readings from Last 3 Encounters:  09/14/21 140 lb 9.6 oz (63.8 kg)  08/24/21 142 lb (64.4 kg)  05/31/21 136 lb (61.7 kg)    Physical Exam  General appearance - alert, well appearing, older Caucasian female and in no distress Mental status - normal mood, behavior, speech, dress, motor activity, and thought processes Eyes - pupils equal and reactive, extraocular eye movements intact Nose - normal and patent, no erythema, discharge or polyps Mouth - mucous membranes moist, pharynx normal without lesions Neck - supple, no significant adenopathy.  No thyroid enlargement or nodules appreciated. Lymphatics - no palpable lymphadenopathy, no hepatosplenomegaly Chest -chest is clear to auscultation today.  No wheezes or crackles appreciated.  Good air entry. Heart - normal rate, regular rhythm, normal S1, S2, no murmurs, rubs, clicks or gallops Abdomen - soft, nontender, nondistended, no masses or organomegaly Extremities -no lower extremity edema.  No cyanosis.  Depression screen [facility] 2/9 09/14/2021 08/30/2021 05/31/2021  Decreased Interest 0 0 2  Down, Depressed, Hopeless 0 0 1  PHQ - 2 Score 0 0 3  Altered sleeping - - 3  Tired, decreased energy - - 0  Change in appetite - - 3  Feeling bad or failure about yourself  - - 2  Trouble concentrating - - 3  Moving slowly or fidgety/restless - - 3  Suicidal thoughts - - 0  PHQ-9 Score - - 17  Difficult doing work/chores - - Not difficult at all   Some recent data might be hidden    CMP Latest Ref Rng & Units 07/04/2021 02/11/2021 12/16/2020  Glucose 70 - 99 mg/dL 96 185(H) 127(H)  BUN 8 - 23 mg/dL _0 Creatinine 0.44 - 1.00 mg/dL 0.98 0.81 0.94  Sodium 135 - 145 mmol/L 136 139 137  Potassium 3.5 - 5.1 mmol/L 4.3 3.9 4.5  Chloride 98 - 111 mmol/L 104 104 104  CO2 22 - 32 mmol/L _1 Calcium 8.9 - 10.3 mg/dL 9.1 9.5 9.3  Total Protein 6.5 - 8.1 g/dL - 7.4 6.0(L)  Total Bilirubin 0.3 - 1.2 mg/dL - 0.9 0.5  Alkaline Phos 38 - 126 U/L - 97 84  AST 15 - 41 U/L - 21 17  ALT 0 - 44 U/L - 14 17   Lipid Panel     Component Value Date/Time   CHOL 147 11/24/2020 0840   TRIG 109 11/24/2020 0840   HDL 58 11/24/2020 0840   CHOLHDL 2.5 11/24/2020 0840   CHOLHDL 3.8 04/16/2016 0934   VLDL 15 04/16/2016 0934   LDLCALC 69 11/24/2020 0840    CBC    Component Value Date/Time   WBC 7.6 07/04/2021  1137   RBC 4.00 07/04/2021 1137   HGB 12.0 07/04/2021 1137   HGB 14.6 06/15/2020 1211   HCT 36.6 07/04/2021 1137   HCT 44.7 06/15/2020 1211   PLT 466 (H) 07/04/2021 1137   PLT 396 06/15/2020 1211   MCV 91.5 07/04/2021 1137   MCV 96 06/15/2020 1211   MCH 30.0 07/04/2021 1137   MCHC 32.8 07/04/2021 1137   RDW 15.2 07/04/2021 1137   RDW 14.2 06/15/2020 1211   LYMPHSABS 2.0 07/04/2021 1137   LYMPHSABS 2.6 04/05/2019 0926   MONOABS 0.8 07/04/2021 1137   EOSABS 0.2 07/04/2021 1137   EOSABS 0.3 04/05/2019 0926   BASOSABS 0.1 07/04/2021 1137   BASOSABS 0.1 04/05/2019 0926   EKG: Normal sinus rhythm with Q waves in V1/V2. LBBB.  Old EKG done in December was not a good enough tracing for comparison.  ASSESSMENT AND PLAN:  1. Preoperative evaluation to rule out surgical contraindication Patient about to undergo arthroscopic surgery on her right shoulder.  Her revised cardiac risk index based on her medical history and type of surgery that she will be having is about 1%. Her cardiac and pulmonary status are maximized at this time  and I do not think she needs any further cardiovascular testing prior to surgery.  Patient to continue beta-blocker and her other blood pressure medications.  She will hold Brilinta for 1 week prior to the procedure. Strongly encouraged her to try to discontinue smoking prior to the procedure.  2. Coronary artery disease involving native coronary artery of native heart without angina pectoris Current status is optimized on her current medications.  Strongly advised to quit smoking. - CBC - Comprehensive metabolic panel  3. Essential hypertension Controlled.  Continue amlodipine, Cozaar and Bystolic  4.  COPD -Patient doing well on Trelegy without flare within the past 2 months. Advised to quit smoking. Would probably benefit from incentive spirometry perioperatively.   5. Tobacco dependence Advised to quit.  She is aware that this can increase cardiovascular risks and delayed healing.  She is not ready to give a trial of quitting.  6. Prediabetes Continue metformin - POCT glycosylated hemoglobin (Hb A1C)  7. Acquired hypothyroidism Continue levothyroxine  8. Major depressive disorder, recurrent episode, in full remission ([place]) 9. GAD (generalized anxiety disorder) Both depression and anxiety well controlled on her current medications  10. Need for immunization against influenza - Flu Vaccine QUAD 65moIM (Fluarix, Fluzone & Alfiuria Quad PF)  11. Encounter for screening mammogram for malignant neoplasm of breast - MM DIGITAL SCREENING BILATERAL; Future  12. Need for shingles vaccine Patient agreeable to getting Shingrix vaccine series.  She will get this after she has her surgery.  Prescription given for her to take to any outside pharmacy. - Zoster Vaccine Adjuvanted ([facility] injection; Inject 0.5 mLs into the muscle once for 1 dose.  Dispense: 0.5 mL; Refill: 1    Patient was given the opportunity to ask questions.  Patient verbalized understanding of the plan and was  able to repeat key elements of the plan.   This documentation was completed using DRadio producer  Any transcriptional errors are unintentional.  Orders Placed This Encounter  Procedures   MM DIGITAL SCREENING BILATERAL   Flu Vaccine QUAD 642moM (Fluarix, Fluzone & Alfiuria Quad PF)   CBC   Comprehensive metabolic panel   POCT glycosylated hemoglobin (Hb A1C)     Requested Prescriptions   Signed Prescriptions Disp Refills   [facility]  1    Sig: Inject 0.5 mLs into the muscle once for 1 dose.    Return in about 3 months (around 12/15/2021).  Karle Plumber, MD, FACP

## 2021-09-15 LAB — COMPREHENSIVE METABOLIC PANEL
ALT: 10 IU/L (ref 0–32)
AST: 17 IU/L (ref 0–40)
Albumin/Globulin Ratio: 2 (ref 1.2–2.2)
Albumin: 4.8 g/dL (ref 3.8–4.8)
Alkaline Phosphatase: 102 IU/L (ref 44–121)
BUN/Creatinine Ratio: 13 (ref 12–28)
BUN: 11 mg/dL (ref 8–27)
Bilirubin Total: 0.2 mg/dL (ref 0.0–1.2)
CO2: 22 mmol/L (ref 20–29)
Calcium: 10.3 mg/dL (ref 8.7–10.3)
Chloride: 102 mmol/L (ref 96–106)
Creatinine, Ser: 0.88 mg/dL (ref 0.57–1.00)
Globulin, Total: 2.4 g/dL (ref 1.5–4.5)
Glucose: 95 mg/dL (ref 70–99)
Potassium: 5.2 mmol/L (ref 3.5–5.2)
Sodium: 141 mmol/L (ref 134–144)
Total Protein: 7.2 g/dL (ref 6.0–8.5)
eGFR: 73 mL/min/{1.73_m2} (ref >59)

## 2021-09-15 LAB — CBC
Hematocrit: 41.5 % (ref 34.0–46.6)
Hemoglobin: 13 g/dL (ref 11.1–15.9)
MCH: 27.4 pg (ref 26.6–33.0)
MCHC: 31.3 g/dL — ABNORMAL LOW (ref 31.5–35.7)
MCV: 87 fL (ref 79–97)
Platelets: 541 10*3/uL — ABNORMAL HIGH (ref 150–450)
RBC: 4.75 x10E6/uL (ref 3.77–5.28)
RDW: 15.8 % — ABNORMAL HIGH (ref 11.7–15.4)
WBC: 8.2 10*3/uL (ref 3.4–10.8)

## 2021-09-16 ENCOUNTER — Telehealth: Payer: Self-pay | Admitting: Internal Medicine

## 2021-09-16 DIAGNOSIS — D75839 Thrombocytosis, unspecified: Secondary | ICD-10-CM

## 2021-09-16 NOTE — Telephone Encounter (Signed)
Phone call placed to patient this morning.  I left a voicemail message informing her that her blood test came back okay except she has mild elevation in her platelet counts.  I explained that platelets are the blood cells that helps a blood clot normally.  She has had a steady increase of the platelet counts over the last year from review of previous CBCs.  I would like to refer her to a hematologist to evaluate this further. ?

## 2021-09-17 ENCOUNTER — Telehealth: Payer: Self-pay

## 2021-09-17 NOTE — Telephone Encounter (Signed)
Referral submitted to hematology for thrombocytosis. ?

## 2021-09-17 NOTE — Telephone Encounter (Signed)
Pt returned call and went over provider message. Pt states she okay with referral being placed  ?

## 2021-09-18 ENCOUNTER — Telehealth: Payer: Self-pay | Admitting: Internal Medicine

## 2021-09-18 ENCOUNTER — Other Ambulatory Visit: Payer: Self-pay | Admitting: Internal Medicine

## 2021-09-18 ENCOUNTER — Telehealth: Payer: Self-pay | Admitting: Hematology

## 2021-09-18 ENCOUNTER — Other Ambulatory Visit: Payer: Self-pay

## 2021-09-18 DIAGNOSIS — E039 Hypothyroidism, unspecified: Secondary | ICD-10-CM

## 2021-09-18 LAB — TSH: TSH: 5.45 u[IU]/mL — ABNORMAL HIGH (ref 0.450–4.500)

## 2021-09-18 LAB — SPECIMEN STATUS REPORT

## 2021-09-18 MED ORDER — LEVOTHYROXINE SODIUM 112 MCG PO TABS
112.0000 ug | ORAL_TABLET | Freq: Every day | ORAL | 6 refills | Status: DC
  Filled 2021-09-18: qty 30, 30d supply, fill #0
  Filled 2021-10-22: qty 30, 30d supply, fill #1
  Filled 2021-11-21: qty 30, 30d supply, fill #2
  Filled 2022-01-04: qty 30, 30d supply, fill #3
  Filled 2022-01-29: qty 90, 90d supply, fill #4

## 2021-09-18 NOTE — Telephone Encounter (Signed)
Phone call placed to patient this a.m.  Patient informed that her thyroid level is not at goal.  She confirms taking the levothyroxine 100 mcg consistently every day.  I told her that we will need to increase the dose to 112 mcg daily.  I have sent the prescription to the pharmacy.  Advised that she pick up and start taking immediately.  After being on the dose for about 6 weeks, she should return to the lab to have thyroid level rechecked.  Patient expressed understanding and is agreeable to the plan. ?

## 2021-09-18 NOTE — Telephone Encounter (Signed)
Scheduled appt per 3/5 referral. Pt is aware of appt date and time. Pt is aware to arrive 15 mins prior to appt time and to bring and updated insurance card. Pt is aware of appt location.   

## 2021-09-19 ENCOUNTER — Other Ambulatory Visit: Payer: Self-pay

## 2021-09-19 ENCOUNTER — Telehealth: Payer: Self-pay | Admitting: Internal Medicine

## 2021-09-19 NOTE — Telephone Encounter (Signed)
Phone call placed to patient today.  Patient advised that we were able to get her in with a hematologist on the 22nd of this month.  Her surgery on the shoulder has not been scheduled as yet.  Advised that she have them schedule the surgery after she sees the hematologist.  Patient expressed understanding. ?

## 2021-09-19 NOTE — Telephone Encounter (Signed)
-----   Message from Ena Dawley sent at 09/18/2021  9:24 AM EST ----- ?Good Morning   ?Dr Wynetta Emery   ?Patient have an appt   10/03/20 @ 11AM   ?----- Message ----- ?From: Ladell Pier, MD ?Sent: 09/18/2021   7:45 AM EST ?To: Ena Dawley ? ?Please try to get in with hematology/oncology ASAP would like to get her in before surgery on shoulder  Thanks. ? ? ?

## 2021-09-25 ENCOUNTER — Ambulatory Visit (HOSPITAL_COMMUNITY)
Admission: EM | Admit: 2021-09-25 | Discharge: 2021-09-25 | Disposition: A | Discharge status: Home / Self Care | Payer: Medicare Other | Attending: Family Medicine | Admitting: Family Medicine

## 2021-09-25 ENCOUNTER — Other Ambulatory Visit: Payer: Self-pay

## 2021-09-25 ENCOUNTER — Encounter (HOSPITAL_COMMUNITY): Payer: Self-pay

## 2021-09-25 DIAGNOSIS — J449 Chronic obstructive pulmonary disease, unspecified: Secondary | ICD-10-CM

## 2021-09-25 DIAGNOSIS — J441 Chronic obstructive pulmonary disease with (acute) exacerbation: Secondary | ICD-10-CM | POA: Diagnosis not present

## 2021-09-25 MED ORDER — ALBUTEROL SULFATE HFA 108 (90 BASE) MCG/ACT IN AERS
2.0000 | INHALATION_SPRAY | RESPIRATORY_TRACT | 0 refills | Status: DC | PRN
  Filled 2021-09-25: qty 18, 25d supply, fill #0

## 2021-09-25 MED ORDER — PREDNISONE 20 MG PO TABS
40.0000 mg | ORAL_TABLET | Freq: Every day | ORAL | 0 refills | Status: DC
  Filled 2021-09-25: qty 10, 5d supply, fill #0

## 2021-09-25 MED ORDER — DOXYCYCLINE HYCLATE 100 MG PO TABS
100.0000 mg | ORAL_TABLET | Freq: Two times a day (BID) | ORAL | 0 refills | Status: AC
  Filled 2021-09-25: qty 14, 7d supply, fill #0

## 2021-09-25 NOTE — Discharge Instructions (Addendum)
Use your albuterol inhaler--2 puffs every 4 hours as needed for shortness of breath or wheezing ? ?Take doxycycline 100 mg--1 capsule twice daily for 7 days ? ?Take prednisone 20 mg--2 tabs daily for 5 days. ?

## 2021-09-25 NOTE — ED Triage Notes (Signed)
Pt presents today with h/o of COPD. Patient states SOB since yesterday. Pt states when walking she notices SOB.  ?

## 2021-09-25 NOTE — ED Provider Notes (Signed)
?[facility] ? ? ? ?CSN: [id number] ?Arrival date & time: 09/25/21  1041 ? ? ?  ? ?History   ?Chief Complaint ?Chief Complaint  ?Patient presents with  ? Shortness of Breath  ? ? ?HPI ?[name] [name] is a 66 y.o. female.  ? ? ?Shortness of Breath ?For worsening shortness of breath in the last day or so. ? ?No fever or chills or new nasal congestion. ? ?She is out of her albuterol inhaler, and has been using her albuterol nebulizer more, and she is still using her Trelegy daily. ? ?About to have shoulder surgery and is concerned about getting well enough to have that on the 24th of this month ? ?Past Medical History:  ?Diagnosis Date  ? Allergy   ? seasonal  ? Anxiety   ? takes Lexapro daily  ? Arthritis   ? "back, from neck down pass my bra area" (03/25/2017)  ? Asthma   ? Bartholin gland cyst 08/29/2011  ? Bruises easily   ? pt is on Effient  ? Chronic back pain   ? herniated nucleus pulposus  ? Chronic back pain   ? "neck to bra area; lower back" (03/25/2017)  ? Chronic kidney disease   ? recurrent UTI's this year 2022  ? COPD (chronic obstructive pulmonary disease) ([place])   ? early stages  ? Coronary artery disease   ? Depression   ? takes Klonopin daily  ? Diabetes mellitus without complication ([place])   ? Diverticulosis   ? GERD (gastroesophageal reflux disease)   ? takes Nexium daily  ? H/O hiatal hernia   ? Heart attack [facility]) 2011  ? Hemorrhoids   ? Hernia   ? Hyperlipidemia   ? takes Lipitor daily  ? Hypertension   ? takes Losartan daily and Labetalol bid  ? Hypothyroidism   ? takes Synthroid daily  ? Insomnia   ? hydroxyzine prn  ? Joint pain   ? Pneumonia   ? "couple times" (03/25/2017)  ? Pre-diabetes   ? "just found out 1 wk ago" (03/25/2017)  ? Psoriasis   ? elbows,knees,back  ? Shortness of breath   ? with exertion  ? Slowing of urinary stream   ? Stress incontinence   ? ? ?Patient Active Problem List  ? Diagnosis Date Noted  ? Major depressive disorder, recurrent, in full remission with anxious  distress ([place]) 05/31/2021  ? Rotator cuff tendinitis, right 05/08/2021  ? 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  ? Chronic neck pain 02/26/2021  ? GAD (generalized anxiety disorder) 01/08/2021  ? Acute cystitis without hematuria 11/07/2020  ? Influenza vaccine needed 06/15/2020  ? Abdominal pain, epigastric 04/28/2019  ? Esophageal dysphagia 04/28/2019  ? Bipolar I disorder ([place]) 09/17/2018  ? Insomnia 10/28/2017  ? Prediabetes 05/28/2017  ? QT prolongation 03/25/2017  ? Encounter for screening mammogram for breast cancer 09/11/2015  ? Psoriasis 06/22/2015  ? COPD (chronic obstructive pulmonary disease) ([facility])GOLD C  05/18/2015  ? Anxiety and depression 07/10/2013  ? Essential hypertension, benign 06/07/2013  ? Dyslipidemia 06/07/2013  ? Asthma, chronic 06/07/2013  ? Emphysema lung ([place]) 06/07/2013  ? Chronic back pain 06/07/2013  ? CAD S/P percutaneous coronary angioplasty 06/07/2013  ? GERD (gastroesophageal reflux disease) 06/07/2013  ? Breast lump on left side at 3 o'clock position 10/13/2012  ? S/P abdominal hysterectomy and right salpingo-oophorectomy 08/29/2011  ? COPD exacerbation ([place]) 09/15/2009  ? TOBACCO ABUSE 07/17/2009  ? Chronic rhinitis 07/17/2009  ? Lung nodule < 6cm on  CT 07/17/2009  ? ALLERGY, FOOD 07/17/2009  ? Hypothyroidism 12/22/2008  ? ? ?Past Surgical History:  ?Procedure Laterality Date  ? ABDOMINAL EXPLORATION SURGERY  1977  ? ABDOMINAL HYSTERECTOMY  1977  ? "left one of my ovaries"  ? BACK SURGERY    ? BIOPSY  05/27/2019  ? Procedure: BIOPSY;  Surgeon: Daneil Dolin, MD;  [facility];  Service: Endoscopy;;  ? COLONOSCOPY  2011  ? Dr. Ardis Hughs: Mild diverticulosis, descending diminutive colon polyp (not retrieved), next colonoscopy 10 years  ? CORONARY ANGIOPLASTY WITH STENT PLACEMENT  2011 X2  ? "regular stents didn't work; had to go back in in ~ 1 month and put in medicated stents"  ? DILATION AND CURETTAGE OF UTERUS    ? ESOPHAGOGASTRODUODENOSCOPY    ?  ESOPHAGOGASTRODUODENOSCOPY (EGD) WITH PROPOFOL N/A 05/27/2019  ? normal esophagus, dilation, erosive gastropathy s/p biopsy, normal duodenum. Negative H.pylori.   ? LEFT HEART CATH AND CORONARY ANGIOGRAPHY N/A 03/26/2017  ? Procedure: LEFT HEART CATH AND CORONARY ANGIOGRAPHY;  Surgeon: Belva Crome, MD;  Location: [place] [facility];  Service: Cardiovascular;  Laterality: N/A;  ? LUMBAR LAMINECTOMY/DECOMPRESSION MICRODISCECTOMY  08/05/2011  ? Procedure: LUMBAR LAMINECTOMY/DECOMPRESSION MICRODISCECTOMY;  Surgeon: Otilio Connors, MD;  Location: [place] [facility];  Service: Neurosurgery;  Laterality: Right;  Right Lumbar four-five extraforaminal discectomy  ? MALONEY DILATION N/A 05/27/2019  ? Procedure: MALONEY DILATION;  Surgeon: Daneil Dolin, MD;  [facility];  Service: Endoscopy;  Laterality: N/A;  54  ? TONSILLECTOMY    ? as a child  ? UPPER GASTROINTESTINAL ENDOSCOPY    ? ? ?OB History   ? ? Gravida  ?1  ? Para  ?1  ? Term  ?1  ? Preterm  ?0  ? AB  ?0  ? Living  ?1  ?  ? ? SAB  ?0  ? IAB  ?0  ? Ectopic  ?0  ? Multiple  ?0  ? Live Births  ?   ?   ?  ?  ? ? ? ?Home Medications   ? ?Prior to Admission medications   ?Medication Sig Start Date End Date Taking? Authorizing Provider  ?doxycycline (VIBRAMYCIN) 100 MG capsule Take 1 capsule (100 mg total) by mouth 2 (two) times daily for 7 days. 09/25/21 10/02/21 Yes Barrett Henle, MD  ?predniSONE (DELTASONE) 20 MG tablet Take 2 tablets (40 mg total) by mouth daily with breakfast for 5 days. 09/25/21 09/30/21 Yes Xyler Terpening, Gwenlyn Perking, MD  ?albuterol (VENTOLIN HFA) 108 (90 Base) MCG/ACT inhaler Inhale 2 puffs into the lungs every 4 (four) hours as needed for wheezing or shortness of breath. INHALE 2 PUFFS INTO THE LUNGS EVERY 6 (SIX) HOURS AS NEEDED FOR WHEEZING OR SHORTNESS OF BREATH. 09/25/21   Barrett Henle, MD  ?amLODipine (NORVASC) 5 MG tablet TAKE 1 TABLET (5 MG TOTAL) BY MOUTH DAILY. 07/12/21 07/12/22  Ladell Pier, MD  ?aspirin EC 81 MG tablet  Take 81 mg by mouth daily.    [provider]  ?atorvastatin (LIPITOR) 80 MG tablet Take 1 tablet (80 mg total) by mouth daily. please schedule appointment for more refills 05/04/21 06/07/21  Camillia Herter, NP  ?Blood Glucose Monitoring Suppl (TRUE METRIX METER) DEVI 1 kit by Does not apply route 4 (four) times daily. 04/04/17   Brayton Caves, PA-C  ?busPIRone (BUSPAR) 15 MG tablet TAKE 2 TABLETS IN THE MORNING, 1 TAB AT NOON AND 1 TAB IN THE  EVENING 08/30/21 08/30/22  Nwoko, Terese Door, PA  ?clonazePAM (KLONOPIN) 0.5 MG tablet Take 1 tablet (0.5 mg total) by mouth 2 (two) times daily as needed for anxiety. 08/01/21   Nwoko, Terese Door, PA  ?doxepin (SINEQUAN) 50 MG capsule TAKE 1 CAPSULE (50 MG TOTAL) BY MOUTH AT BEDTIME. 08/30/21 08/30/22  Nwoko, Terese Door, PA  ?escitalopram (LEXAPRO) 10 MG tablet TAKE 3 TABLETS (30 MG TOTAL) BY MOUTH DAILY. 08/30/21 08/30/22  Malachy Mood, PA  ?Fluticasone-Umeclidin-Vilant (TRELEGY ELLIPTA) 100-62.5-25 MCG/ACT AEPB Inhale 1 puff into the lungs daily 02/26/21   Elsie Stain, MD  ?furosemide (LASIX) 20 MG tablet Take 1 tablet (20 mg total) by mouth daily. 07/26/21   Ladell Pier, MD  ?gabapentin (NEURONTIN) 600 MG tablet TAKE 1 TABLET BY MOUTH 4 TIMES DAILY 09/29/20 09/29/21  Ladell Pier, MD  ?glucose blood (TRUE METRIX BLOOD GLUCOSE TEST) test strip Use as instructed 01/21/18   Ladell Pier, MD  ?ipratropium-albuterol (DUONEB) 0.5-2.5 (3) MG/3ML SOLN TAKE 3 MLS VIA NEBULIZATION EVERY 6 HOURS ?Patient taking differently: TAKE 3 MLS VIA NEBULIZATION EVERY 6 HOURS as needed 11/18/19   Elsie Stain, MD  ?levothyroxine (SYNTHROID) 112 MCG tablet Take 1 tablet (112 mcg total) by mouth daily. 09/18/21   Ladell Pier, MD  ?losartan (COZAAR) 50 MG tablet TAKE 1 & 1/2 TABLETS BY MOUTH DAILY. 07/17/21 07/17/22  Ladell Pier, MD  ?metFORMIN (GLUCOPHAGE) 500 MG tablet TAKE 1 TABLET (500 MG TOTAL) BY MOUTH 2 (TWO) TIMES DAILY WITH A MEAL. 07/26/21   Ladell Pier, MD  ?methocarbamol (ROBAXIN) 500 MG tablet Take 1 tablet by mouth 4 times daily 07/04/21     ?methocarbamol (ROBAXIN) 500 MG tablet Take 1 tablet (500 mg total) by mouth 2 (two) times daily as needed for musc

## 2021-09-27 ENCOUNTER — Encounter (HOSPITAL_COMMUNITY): Payer: Self-pay | Admitting: Anesthesiology

## 2021-09-27 NOTE — Progress Notes (Addendum)
Pt chart/ health history reviewed with Dr. Roanna Banning. He recommends that this pt first be re-scheduled for her shoulder surgery until her acute COPD exacerbation has had time to resolve. He also feels that  with her past respiratory and cardiac history that this pt would be a better candidate for the Main OR and not outpatient.  Will call and relay this message to Dr. Lorin Mercy office. ?If patient is rescheduled as outpatient she will require in person anesthesia consult prior to procedure. ?

## 2021-10-03 ENCOUNTER — Inpatient Hospital Stay: Payer: Medicare Other

## 2021-10-03 ENCOUNTER — Other Ambulatory Visit: Payer: Self-pay

## 2021-10-03 ENCOUNTER — Inpatient Hospital Stay: Payer: Medicare Other | Attending: Hematology | Admitting: Hematology

## 2021-10-03 VITALS — BP 142/98 | HR 67 | Temp 97.4°F | Resp 20 | Wt 140.3 lb

## 2021-10-03 DIAGNOSIS — I129 Hypertensive chronic kidney disease with stage 1 through stage 4 chronic kidney disease, or unspecified chronic kidney disease: Secondary | ICD-10-CM | POA: Diagnosis not present

## 2021-10-03 DIAGNOSIS — D75839 Thrombocytosis, unspecified: Secondary | ICD-10-CM

## 2021-10-03 DIAGNOSIS — Z803 Family history of malignant neoplasm of breast: Secondary | ICD-10-CM | POA: Diagnosis not present

## 2021-10-03 DIAGNOSIS — F17210 Nicotine dependence, cigarettes, uncomplicated: Secondary | ICD-10-CM | POA: Diagnosis not present

## 2021-10-03 DIAGNOSIS — D509 Iron deficiency anemia, unspecified: Secondary | ICD-10-CM

## 2021-10-03 DIAGNOSIS — E1122 Type 2 diabetes mellitus with diabetic chronic kidney disease: Secondary | ICD-10-CM | POA: Diagnosis not present

## 2021-10-03 DIAGNOSIS — Z90710 Acquired absence of both cervix and uterus: Secondary | ICD-10-CM | POA: Diagnosis not present

## 2021-10-03 DIAGNOSIS — Z808 Family history of malignant neoplasm of other organs or systems: Secondary | ICD-10-CM | POA: Diagnosis not present

## 2021-10-03 DIAGNOSIS — N189 Chronic kidney disease, unspecified: Secondary | ICD-10-CM | POA: Diagnosis not present

## 2021-10-03 LAB — CBC WITH DIFFERENTIAL/PLATELET
Abs Immature Granulocytes: 0.03 10*3/uL (ref 0.00–0.07)
Basophils Absolute: 0 10*3/uL (ref 0.0–0.1)
Basophils Relative: 0 %
Eosinophils Absolute: 0.1 10*3/uL (ref 0.0–0.5)
Eosinophils Relative: 1 %
HCT: 40 % (ref 36.0–46.0)
Hemoglobin: 12.6 g/dL (ref 12.0–15.0)
Immature Granulocytes: 0 %
Lymphocytes Relative: 19 %
Lymphs Abs: 2 10*3/uL (ref 0.7–4.0)
MCH: 27.2 pg (ref 26.0–34.0)
MCHC: 31.5 g/dL (ref 30.0–36.0)
MCV: 86.4 fL (ref 80.0–100.0)
Monocytes Absolute: 1.1 10*3/uL — ABNORMAL HIGH (ref 0.1–1.0)
Monocytes Relative: 11 %
Neutro Abs: 7.1 10*3/uL (ref 1.7–7.7)
Neutrophils Relative %: 69 %
Platelets: 623 10*3/uL — ABNORMAL HIGH (ref 150–400)
RBC: 4.63 MIL/uL (ref 3.87–5.11)
RDW: 18 % — ABNORMAL HIGH (ref 11.5–15.5)
WBC: 10.4 10*3/uL (ref 4.0–10.5)
nRBC: 0 % (ref 0.0–0.2)

## 2021-10-03 LAB — IRON AND IRON BINDING CAPACITY (CC-WL,HP ONLY)
Iron: 73 ug/dL (ref 28–170)
Saturation Ratios: 13 % (ref 10.4–31.8)
TIBC: 553 ug/dL — ABNORMAL HIGH (ref 250–450)
UIBC: 480 ug/dL — ABNORMAL HIGH (ref 148–442)

## 2021-10-03 LAB — CMP (CANCER CENTER ONLY)
ALT: 11 U/L (ref 0–44)
AST: 11 U/L — ABNORMAL LOW (ref 15–41)
Albumin: 4.3 g/dL (ref 3.5–5.0)
Alkaline Phosphatase: 97 U/L (ref 38–126)
Anion gap: 6 (ref 5–15)
BUN: 10 mg/dL (ref 8–23)
CO2: 28 mmol/L (ref 22–32)
Calcium: 9.8 mg/dL (ref 8.9–10.3)
Chloride: 105 mmol/L (ref 98–111)
Creatinine: 0.78 mg/dL (ref 0.44–1.00)
GFR, Estimated: >60 mL/min (ref >60)
Glucose, Bld: 104 mg/dL — ABNORMAL HIGH (ref 70–99)
Potassium: 4 mmol/L (ref 3.5–5.1)
Sodium: 139 mmol/L (ref 135–145)
Total Bilirubin: 0.4 mg/dL (ref 0.3–1.2)
Total Protein: 7.2 g/dL (ref 6.5–8.1)

## 2021-10-03 LAB — FERRITIN: Ferritin: 13 ng/mL (ref 11–307)

## 2021-10-03 NOTE — Progress Notes (Addendum)
. ? ? ?HEMATOLOGY/ONCOLOGY CONSULTATION NOTE ? ?Date of Service: 10/03/2021 ? ?Patient Care Team: ?Ladell Pier, MD as PCP - General (Internal Medicine) ?Troy Sine, MD as PCP - Cardiology (Cardiology) ?Rourk, [name], MD as Consulting Physician (Gastroenterology)  ? ?CHIEF COMPLAINTS/PURPOSE OF CONSULTATION:  ?Thrombocytosis ? ?HISTORY OF PRESENTING ILLNESS:  ? ?[name] [name] is a wonderful 66 y.o. female who has been referred to [place] by Dr .Wynetta Emery, Dalbert Batman, MD for evaluation and management of thrombocytosis. ? ?Patient has a history of hypertension, dyslipidemia, hiatal hernia, coronary artery disease with previous history of MI on dual antiplatelet therapy, chronic kidney disease, arthritis who on recent labs on 09/14/2021 was noted to have elevated platelet counts of 541k with normal hemoglobin of 13 and normal WBC count of 8.2k. ? ?Review of her available labs show that her platelet counts were first noted to be elevated at 418k on 08/30/2020 and have progressively increased to 466k on 07/04/2021 and are now at 541k. ? ?Patient notes no history of VTE in the past. ?No current chest pain or shortness of breath. ?Does have history of easy bruising likely due to her dual antiplatelet therapy. ? ?Denies any other overt bleeding. ?No other acute new focal symptoms. ? ?MEDICAL HISTORY:  ?Past Medical History:  ?Diagnosis Date  ? Allergy   ? seasonal  ? Anxiety   ? takes Lexapro daily  ? Arthritis   ? "back, from neck down pass my bra area" (03/25/2017)  ? Asthma   ? Bartholin gland cyst 08/29/2011  ? Bruises easily   ? pt is on Effient  ? Chronic back pain   ? herniated nucleus pulposus  ? Chronic back pain   ? "neck to bra area; lower back" (03/25/2017)  ? Chronic kidney disease   ? recurrent UTI's this year 2022  ? COPD (chronic obstructive pulmonary disease) ([place])   ? early stages  ? Coronary artery disease   ? Depression   ? takes Klonopin daily  ? Diabetes mellitus without complication ([place])   ?  Diverticulosis   ? GERD (gastroesophageal reflux disease)   ? takes Nexium daily  ? H/O hiatal hernia   ? Heart attack [facility]) 2011  ? Hemorrhoids   ? Hernia   ? Hyperlipidemia   ? takes Lipitor daily  ? Hypertension   ? takes Losartan daily and Labetalol bid  ? Hypothyroidism   ? takes Synthroid daily  ? Insomnia   ? hydroxyzine prn  ? Joint pain   ? Pneumonia   ? "couple times" (03/25/2017)  ? Pre-diabetes   ? "just found out 1 wk ago" (03/25/2017)  ? Psoriasis   ? elbows,knees,back  ? Shortness of breath   ? with exertion  ? Slowing of urinary stream   ? Stress incontinence   ? ? ?SURGICAL HISTORY: ?Past Surgical History:  ?Procedure Laterality Date  ? ABDOMINAL EXPLORATION SURGERY  1977  ? ABDOMINAL HYSTERECTOMY  1977  ? "left one of my ovaries"  ? BACK SURGERY    ? BIOPSY  05/27/2019  ? Procedure: BIOPSY;  Surgeon: Daneil Dolin, MD;  [facility];  Service: Endoscopy;;  ? COLONOSCOPY  2011  ? Dr. Ardis Hughs: Mild diverticulosis, descending diminutive colon polyp (not retrieved), next colonoscopy 10 years  ? CORONARY ANGIOPLASTY WITH STENT PLACEMENT  2011 X2  ? "regular stents didn't work; had to go back in in ~ 1 month and put in medicated stents"  ? DILATION AND CURETTAGE OF UTERUS    ?  ESOPHAGOGASTRODUODENOSCOPY    ? ESOPHAGOGASTRODUODENOSCOPY (EGD) WITH PROPOFOL N/A 05/27/2019  ? normal esophagus, dilation, erosive gastropathy s/p biopsy, normal duodenum. Negative H.pylori.   ? LEFT HEART CATH AND CORONARY ANGIOGRAPHY N/A 03/26/2017  ? Procedure: LEFT HEART CATH AND CORONARY ANGIOGRAPHY;  Surgeon: Belva Crome, MD;  Location: [place] [facility];  Service: Cardiovascular;  Laterality: N/A;  ? LUMBAR LAMINECTOMY/DECOMPRESSION MICRODISCECTOMY  08/05/2011  ? Procedure: LUMBAR LAMINECTOMY/DECOMPRESSION MICRODISCECTOMY;  Surgeon: Otilio Connors, MD;  Location: [place] [facility];  Service: Neurosurgery;  Laterality: Right;  Right Lumbar four-five extraforaminal discectomy  ? MALONEY DILATION N/A 05/27/2019  ?  Procedure: MALONEY DILATION;  Surgeon: Daneil Dolin, MD;  [facility];  Service: Endoscopy;  Laterality: N/A;  54  ? TONSILLECTOMY    ? as a child  ? UPPER GASTROINTESTINAL ENDOSCOPY    ? ? ?SOCIAL HISTORY: ?Social History  ? ?Socioeconomic History  ? Marital status: Divorced  ?  Spouse name: Not on file  ? Number of children: Not on file  ? Years of education: Not on file  ? Highest education level: Not on file  ?Occupational History  ? Not on file  ?Tobacco Use  ? Smoking status: Every Day  ?  Packs/day: 1.00  ?  Years: 50.00  ?  Pack years: 50.00  ?  Types: Cigarettes  ? Smokeless tobacco: Never  ?Vaping Use  ? Vaping Use: Never used  ?Substance and Sexual Activity  ? Alcohol use: Yes  ?  Comment: once a week  ? Drug use: No  ?  Types: Cocaine  ?  Comment: has been years per pt  ? Sexual activity: Not Currently  ?  Birth control/protection: Surgical  ?Other Topics Concern  ? Not on file  ?Social History Narrative  ? Not on file  ? ?Social Determinants of Health  ? ?Financial Resource Strain: Not on file  ?Food Insecurity: Not on file  ?Transportation Needs: Not on file  ?Physical Activity: Not on file  ?Stress: Not on file  ?Social Connections: Not on file  ?Intimate Partner Violence: Not on file  ? ? ?FAMILY HISTORY: ?Family History  ?Problem Relation Age of Onset  ? Heart disease Mother   ? Hypertension Mother   ? Stroke Mother   ? Mental illness Mother   ? Heart disease Father   ? Hypertension Father   ? Diabetes Father   ? Colon polyps Brother   ? Breast cancer Maternal Aunt   ? Throat cancer Maternal Uncle   ? Thyroid disease Paternal Aunt   ? Heart disease Maternal Grandfather   ? Cancer Paternal Grandmother   ?     mouth  ? Anesthesia problems Daughter   ? Hypotension Neg Hx   ? Malignant hyperthermia Neg Hx   ? Pseudochol deficiency Neg Hx   ? Colon cancer Neg Hx   ? Stomach cancer Neg Hx   ? Esophageal cancer Neg Hx   ? Pancreatic cancer Neg Hx   ? Rectal cancer Neg Hx   ? ? ?ALLERGIES:   is allergic to avocado, latex, and codeine. ? ?MEDICATIONS:  ?Current Outpatient Medications  ?Medication Sig Dispense Refill  ? albuterol (VENTOLIN HFA) 108 (90 Base) MCG/ACT inhaler INHALE 2 PUFFS INTO THE LUNGS EVERY 4-6 HOURS AS NEEDED FOR WHEEZING OR SHORTNESS OF BREATH. 18 g 0  ? amLODipine (NORVASC) 5 MG tablet TAKE 1 TABLET (5 MG TOTAL) BY MOUTH DAILY. 90 tablet 1  ? aspirin EC 81 MG  tablet Take 81 mg by mouth daily.    ? atorvastatin (LIPITOR) 80 MG tablet Take 1 tablet (80 mg total) by mouth daily. please schedule appointment for more refills 30 tablet 0  ? Blood Glucose Monitoring Suppl (TRUE METRIX METER) DEVI 1 kit by Does not apply route 4 (four) times daily. 1 Device 0  ? busPIRone (BUSPAR) 15 MG tablet TAKE 2 TABLETS IN THE MORNING, 1 TAB AT NOON AND 1 TAB IN THE EVENING (Patient taking differently: Take 15 mg by mouth 2 (two) times daily.) 120 tablet 2  ? clonazePAM (KLONOPIN) 0.5 MG tablet Take 1 tablet (0.5 mg total) by mouth 2 (two) times daily as needed for anxiety. 60 tablet 2  ? doxepin (SINEQUAN) 50 MG capsule TAKE 1 CAPSULE (50 MG TOTAL) BY MOUTH AT BEDTIME. 30 capsule 2  ? escitalopram (LEXAPRO) 10 MG tablet TAKE 3 TABLETS (30 MG TOTAL) BY MOUTH DAILY. (Patient taking differently: Take 10 mg by mouth in the morning and at bedtime.) 90 tablet 2  ? Fluticasone-Umeclidin-Vilant (TRELEGY ELLIPTA) 100-62.5-25 MCG/ACT AEPB Inhale 1 puff into the lungs daily 60 each 2  ? furosemide (LASIX) 20 MG tablet Take 1 tablet (20 mg total) by mouth daily. 30 tablet 2  ? gabapentin (NEURONTIN) 600 MG tablet TAKE 1 TABLET BY MOUTH 4 TIMES DAILY 360 tablet 1  ? glucose blood (TRUE METRIX BLOOD GLUCOSE TEST) test strip Use as instructed 100 each 12  ? ipratropium-albuterol (DUONEB) 0.5-2.5 (3) MG/3ML SOLN TAKE 3 MLS VIA NEBULIZATION EVERY 6 HOURS (Patient taking differently: Take 3 mLs by nebulization every 6 (six) hours as needed (shortness of breath or wheezing).) 360 mL 1  ? levothyroxine (SYNTHROID) 112 MCG  tablet Take 1 tablet (112 mcg total) by mouth daily. 30 tablet 6  ? losartan (COZAAR) 50 MG tablet TAKE 1 & 1/2 TABLETS BY MOUTH DAILY. 45 tablet 2  ? metFORMIN (GLUCOPHAGE) 500 MG tablet TAKE 1 TABLET (500 MG T

## 2021-10-04 NOTE — Pre-Procedure Instructions (Signed)
Surgical Instructions ? ? ? Your procedure is scheduled on Friday, October 12, 2021 at 7:30 AM. ? Report to [facility] Main Entrance "A" at 5:30 A.M., then check in with the Admitting office. ? Call this number if you have problems the morning of surgery: ? [phone] ? ? If you have any questions prior to your surgery date call [phone]: Open Monday-Friday 8am-4pm ? ? ? Remember: ? Do not eat after midnight the night before your surgery ? ?You may drink clear liquids until 4:30 AM the morning of your surgery.   ?Clear liquids allowed are: Water, Non-Citrus Juices (without pulp), Carbonated Beverages, Clear Tea, Black Coffee Only (NO MILK, CREAM OR POWDERED CREAMER of any kind), and Gatorade. ? ? ?Enhanced Recovery after Surgery for Orthopedics ?Enhanced Recovery after Surgery is a protocol used to improve the stress on your body and your recovery after surgery. ? ?Patient Instructions ? ?The day of surgery (if you have diabetes): ? ?Drink ONE small G2 by 4:30 am the morning of surgery ?This bottle was given to you during your hospital  ?pre-op appointment visit.  ?Nothing else to drink after completing the  ?Small 10 oz bottle of water. ? ?       If you have questions, please contact your surgeon?s office. ? ?  ? Take these medicines the morning of surgery with A SIP OF WATER: ? ?amLODipine (NORVASC) ?atorvastatin (LIPITOR) ?busPIRone (BUSPAR) ?escitalopram (LEXAPRO) ?Fluticasone-Umeclidin-Vilant (TRELEGY ELLIPTA) - Bring with you day of surgery ?gabapentin (NEURONTIN)  ?levothyroxine (SYNTHROID)  ?nebivolol (BYSTOLIC)  ?omeprazole (PRILOSEC) ? ?albuterol (VENTOLIN HFA) - Bring with you day of surgery ?clonazePAM (KLONOPIN)  ?ipratropium-albuterol (DUONEB) ?methocarbamol (ROBAXIN)  ?sodium chloride (OCEAN)  ? ?Stop taking your ticagrelor (BRILINTA) 1 week prior to surgery. Last dose will be 10/04/21. ? ?Follow your surgeon's instructions on when to stop Aspirin.  If no instructions were given by your surgeon  then you will need to call the office to get those instructions.   ? ? ?As of today, STOP taking any Aleve, Naproxen, Ibuprofen, Motrin, Advil, Goody's, BC's, all herbal medications, fish oil, and all vitamins. ? ?WHAT DO I DO ABOUT MY DIABETES MEDICATION? ? ? ?Do not take metFORMIN (GLUCOPHAGE) the morning of surgery. ? ?HOW TO MANAGE YOUR DIABETES ?BEFORE AND AFTER SURGERY ? ?Why is it important to control my blood sugar before and after surgery? ?Improving blood sugar levels before and after surgery helps healing and can limit problems. ?A way of improving blood sugar control is eating a healthy diet by: ? Eating less sugar and carbohydrates ? Increasing activity/exercise ? Talking with your doctor about reaching your blood sugar goals ?High blood sugars (greater than 180 mg/dL) can raise your risk of infections and slow your recovery, so you will need to focus on controlling your diabetes during the weeks before surgery. ?Make sure that the doctor who takes care of your diabetes knows about your planned surgery including the date and location. ? ?How do I manage my blood sugar before surgery? ?Check your blood sugar at least 4 times a day, starting 2 days before surgery, to make sure that the level is not too high or low. ? ?Check your blood sugar the morning of your surgery when you wake up and every 2 hours until you get to the Short Stay unit. ? ?If your blood sugar is less than 70 mg/dL, you will need to treat for low blood sugar: ?Do not take insulin. ?Treat a low blood sugar (less than  70 mg/dL) with ? cup of clear juice (cranberry or apple), 4 glucose tablets, OR glucose gel. ?Recheck blood sugar in 15 minutes after treatment (to make sure it is greater than 70 mg/dL). If your blood sugar is not greater than 70 mg/dL on recheck, call [phone] for further instructions. ?Report your blood sugar to the short stay nurse when you get to Short Stay. ? ?If you are admitted to the hospital after  surgery: ?Your blood sugar will be checked by the staff and you will probably be given insulin after surgery (instead of oral diabetes medicines) to make sure you have good blood sugar levels. ?The goal for blood sugar control after surgery is 80-180 mg/dL. ? ?         ?           ?Do NOT Smoke (Tobacco/Vaping) for 24 hours prior to your procedure. ? ?If you use a CPAP at night, you may bring your mask/headgear for your overnight stay. ?  ?Contacts, glasses, piercing's, hearing aid's, dentures or partials may not be worn into surgery, please bring cases for these belongings.  ?  ?For patients admitted to the hospital, discharge time will be determined by your treatment team. ?  ?Patients discharged the day of surgery will not be allowed to drive home, and someone needs to stay with them for 24 hours. ? ?SURGICAL WAITING ROOM VISITATION ?Patients having surgery or a procedure may have two support people in the waiting area. ?Visitors may stay in the waiting area during the procedure and switch out with other visitors if needed. ?Children under the age of 28 must have an adult accompany them who is not the patient. ?If the patient needs to stay at the hospital during part of their recovery, the visitor guidelines for inpatient rooms apply. ? ?Please refer to the [facility] website for the visitor guidelines for Inpatients (after your surgery is over and you are in a regular room).  ? ? ?Special instructions:   ?[facility]- Preparing For Surgery ? ?Before surgery, you can play an important role. Because skin is not sterile, your skin needs to be as free of germs as possible. You can reduce the number of germs on your skin by washing with CHG (chlorahexidine gluconate) Soap before surgery.  CHG is an antiseptic cleaner which kills germs and bonds with the skin to continue killing germs even after washing.   ? ?Oral Hygiene is also important to reduce your risk of infection.  Remember - BRUSH YOUR TEETH THE MORNING OF  SURGERY WITH YOUR REGULAR TOOTHPASTE ? ?Please do not use if you have an allergy to CHG or antibacterial soaps. If your skin becomes reddened/irritated stop using the CHG.  ?Do not shave (including legs and underarms) for at least 48 hours prior to first CHG shower. It is OK to shave your face. ? ?Please follow these instructions carefully. ?  ?Shower the NIGHT BEFORE SURGERY and the MORNING OF SURGERY ? ?If you chose to wash your hair, wash your hair first as usual with your normal shampoo. ? ?After you shampoo, rinse your hair and body thoroughly to remove the shampoo. ? ?Use CHG Soap as you would any other liquid soap. You can apply CHG directly to the skin and wash gently with a scrungie or a clean washcloth.  ? ?Apply the CHG Soap to your body ONLY FROM THE NECK DOWN.  Do not use on open wounds or open sores. Avoid contact with your eyes, ears, mouth  and genitals (private parts). Wash Face and genitals (private parts)  with your normal soap.  ? ?Wash thoroughly, paying special attention to the area where your surgery will be performed. ? ?Thoroughly rinse your body with warm water from the neck down. ? ?DO NOT shower/wash with your normal soap after using and rinsing off the CHG Soap. ? ?Pat yourself dry with a CLEAN TOWEL. ? ?Wear CLEAN PAJAMAS to bed the night before surgery ? ?Place CLEAN SHEETS on your bed the night before your surgery ? ?DO NOT SLEEP WITH PETS. ? ? ?Day of Surgery: ?Take a shower with CHG soap. ?Do not wear jewelry or makeup ?Do not wear lotions, powders, perfumes, or deodorant. ?Do not shave 48 hours prior to surgery. ?Do not bring valuables to the hospital.  ?[facility] is not responsible for any belongings or valuables. ?Do not wear nail polish, gel polish, artificial nails, or any other type of covering on natural nails (fingers and toes) ?If you have artificial nails or gel coating that need to be removed by a nail salon, please have this removed prior to surgery. Artificial nails or  gel coating may interfere with anesthesia's ability to adequately monitor your vital signs. ?Wear Clean/Comfortable clothing the morning of surgery ?Do not apply any deodorants/lotions.   ?Remember to brush your teet

## 2021-10-05 ENCOUNTER — Other Ambulatory Visit: Payer: Self-pay

## 2021-10-05 ENCOUNTER — Encounter (HOSPITAL_COMMUNITY)
Admission: RE | Admit: 2021-10-05 | Discharge: 2021-10-05 | Disposition: A | Discharge status: Home / Self Care | Payer: Medicare Other | Source: Ambulatory Visit | Attending: Orthopaedic Surgery | Admitting: Orthopaedic Surgery

## 2021-10-05 ENCOUNTER — Encounter (HOSPITAL_COMMUNITY): Payer: Self-pay

## 2021-10-05 ENCOUNTER — Ambulatory Visit (HOSPITAL_COMMUNITY)
Admission: RE | Admit: 2021-10-05 | Discharge: 2021-10-05 | Disposition: A | Discharge status: Home / Self Care | Payer: Medicare Other | Source: Ambulatory Visit | Attending: Orthopaedic Surgery | Admitting: Orthopaedic Surgery

## 2021-10-05 VITALS — BP 129/70 | HR 69 | Temp 98.9°F | Resp 18 | Ht 61.0 in | Wt 144.1 lb

## 2021-10-05 DIAGNOSIS — E119 Type 2 diabetes mellitus without complications: Secondary | ICD-10-CM

## 2021-10-05 DIAGNOSIS — Z01818 Encounter for other preprocedural examination: Secondary | ICD-10-CM | POA: Diagnosis not present

## 2021-10-05 LAB — HEMOGLOBIN A1C
Hgb A1c MFr Bld: 5.8 % — ABNORMAL HIGH (ref 4.8–5.6)
Mean Plasma Glucose: 119.76 mg/dL

## 2021-10-05 LAB — SURGICAL PCR SCREEN
MRSA, PCR: NEGATIVE
Staphylococcus aureus: NEGATIVE

## 2021-10-05 LAB — GLUCOSE, CAPILLARY: Glucose-Capillary: 111 mg/dL — ABNORMAL HIGH (ref 70–99)

## 2021-10-05 NOTE — Progress Notes (Signed)
PCP:  Karle Plumber, MD ?Cardiologist: Shelva Majestic, MD ? ?EKG: 07/05/21 ?CXR: na ?ECHO: 03/26/17 ?Stress Test:  11/25/19 ?Cardiac Cath:  01/29/10 ? ?Fasting Blood Sugar-  does not check glucose.  No glucometer ?Checks Blood Sugar_0__ times a day ? ?ASA: Patient to call office for instructions ?Brilinta: Last dose 10/05/21 ? ?OSA/CPAP: No ? ?Covid test not needed ? ?Anesthesia Review: Yes, cardiac history ? ?Patient denies shortness of breath, fever, cough, and chest pain at PAT appointment. ? ?Patient verbalized understanding of instructions provided today at the PAT appointment.  Patient asked to review instructions at home and day of surgery.   ?

## 2021-10-05 NOTE — Pre-Procedure Instructions (Signed)
Surgical Instructions ? ? ? Your procedure is scheduled on Friday, October 12, 2021 at 7:30 AM. ? Report to [facility] Main Entrance "A" at 5:30 A.M., then check in with the Admitting office. ? Call this number if you have problems the morning of surgery: ? [phone] ? ? If you have any questions prior to your surgery date call [phone]: Open Monday-Friday 8am-4pm ? ? ? Remember: ? Do not eat after midnight the night before your surgery ? ?You may drink clear liquids until 4:30 AM the morning of your surgery.   ?Clear liquids allowed are: Water, Non-Citrus Juices (without pulp), Carbonated Beverages, Clear Tea, Black Coffee Only (NO MILK, CREAM OR POWDERED CREAMER of any kind), and Gatorade. ? ? ?Enhanced Recovery after Surgery for Orthopedics ?Enhanced Recovery after Surgery is a protocol used to improve the stress on your body and your recovery after surgery. ? ?Patient Instructions ? ?The day of surgery (if you have diabetes): ? ?Drink ONE small G2 by 4:30 am the morning of surgery ?This bottle was given to you during your hospital  ?pre-op appointment visit.  ? ?       If you have questions, please contact your surgeon?s office. ? ?  ? Take these medicines the morning of surgery with A SIP OF WATER: ? ?amLODipine (NORVASC) ?atorvastatin (LIPITOR) ?busPIRone (BUSPAR) ?escitalopram (LEXAPRO) ?Fluticasone-Umeclidin-Vilant (TRELEGY ELLIPTA) - Bring with you day of surgery ?gabapentin (NEURONTIN)  ?levothyroxine (SYNTHROID)  ?nebivolol (BYSTOLIC)  ?omeprazole (PRILOSEC) ? ?albuterol (VENTOLIN HFA) - Bring with you day of surgery ?clonazePAM (KLONOPIN)  ?ipratropium-albuterol (DUONEB) ?methocarbamol (ROBAXIN)  ?sodium chloride (OCEAN)  ? ?Stop taking your ticagrelor (BRILINTA) 1 week prior to surgery. Last dose will be 10/04/21. ? ?Follow your surgeon's instructions on when to stop Aspirin.  If no instructions were given by your surgeon then you will need to call the office to get those instructions.   ? ? ?As of  today, STOP taking any Aleve, Naproxen, Ibuprofen, Motrin, Advil, Goody's, BC's, all herbal medications, fish oil, and all vitamins. ? ?WHAT DO I DO ABOUT MY DIABETES MEDICATION? ? ? ?Do not take metFORMIN (GLUCOPHAGE) the morning of surgery. ? ?HOW TO MANAGE YOUR DIABETES ?BEFORE AND AFTER SURGERY ? ?Why is it important to control my blood sugar before and after surgery? ?Improving blood sugar levels before and after surgery helps healing and can limit problems. ?A way of improving blood sugar control is eating a healthy diet by: ? Eating less sugar and carbohydrates ? Increasing activity/exercise ? Talking with your doctor about reaching your blood sugar goals ?High blood sugars (greater than 180 mg/dL) can raise your risk of infections and slow your recovery, so you will need to focus on controlling your diabetes during the weeks before surgery. ?Make sure that the doctor who takes care of your diabetes knows about your planned surgery including the date and location. ? ?How do I manage my blood sugar before surgery? ?Check your blood sugar at least 4 times a day, starting 2 days before surgery, to make sure that the level is not too high or low. ? ?Check your blood sugar the morning of your surgery when you wake up and every 2 hours until you get to the Short Stay unit. ? ?If your blood sugar is less than 70 mg/dL, you will need to treat for low blood sugar: ?Do not take insulin. ?Treat a low blood sugar (less than 70 mg/dL) with ? cup of clear juice (cranberry or apple), 4 glucose tablets,  OR glucose gel. ?Recheck blood sugar in 15 minutes after treatment (to make sure it is greater than 70 mg/dL). If your blood sugar is not greater than 70 mg/dL on recheck, call [phone] for further instructions. ?Report your blood sugar to the short stay nurse when you get to Short Stay. ? ?If you are admitted to the hospital after surgery: ?Your blood sugar will be checked by the staff and you will probably be given  insulin after surgery (instead of oral diabetes medicines) to make sure you have good blood sugar levels. ?The goal for blood sugar control after surgery is 80-180 mg/dL. ? ?         ?           ?Do NOT Smoke (Tobacco/Vaping) for 24 hours prior to your procedure. ? ?If you use a CPAP at night, you may bring your mask/headgear for your overnight stay. ?  ?Contacts, glasses, piercing's, hearing aid's, dentures or partials may not be worn into surgery, please bring cases for these belongings.  ?  ?For patients admitted to the hospital, discharge time will be determined by your treatment team. ?  ?Patients discharged the day of surgery will not be allowed to drive home, and someone needs to stay with them for 24 hours. ? ?SURGICAL WAITING ROOM VISITATION ?Patients having surgery or a procedure may have two support people in the waiting area. ?Visitors may stay in the waiting area during the procedure and switch out with other visitors if needed. ?Children under the age of 76 must have an adult accompany them who is not the patient. ?If the patient needs to stay at the hospital during part of their recovery, the visitor guidelines for inpatient rooms apply. ? ?Please refer to the [facility] website for the visitor guidelines for Inpatients (after your surgery is over and you are in a regular room).  ? ? ?Special instructions:   ?[facility]- Preparing For Surgery ? ?Before surgery, you can play an important role. Because skin is not sterile, your skin needs to be as free of germs as possible. You can reduce the number of germs on your skin by washing with CHG (chlorahexidine gluconate) Soap before surgery.  CHG is an antiseptic cleaner which kills germs and bonds with the skin to continue killing germs even after washing.   ? ?Oral Hygiene is also important to reduce your risk of infection.  Remember - BRUSH YOUR TEETH THE MORNING OF SURGERY WITH YOUR REGULAR TOOTHPASTE ? ?Please do not use if you have an allergy to CHG  or antibacterial soaps. If your skin becomes reddened/irritated stop using the CHG.  ?Do not shave (including legs and underarms) for at least 48 hours prior to first CHG shower. It is OK to shave your face. ? ?Please follow these instructions carefully. ?  ?Shower the NIGHT BEFORE SURGERY and the MORNING OF SURGERY ? ?If you chose to wash your hair, wash your hair first as usual with your normal shampoo. ? ?After you shampoo, rinse your hair and body thoroughly to remove the shampoo. ? ?Use CHG Soap as you would any other liquid soap. You can apply CHG directly to the skin and wash gently with a scrungie or a clean washcloth.  ? ?Apply the CHG Soap to your body ONLY FROM THE NECK DOWN.  Do not use on open wounds or open sores. Avoid contact with your eyes, ears, mouth and genitals (private parts). Wash Face and genitals (private parts)  with your normal  soap.  ? ?Wash thoroughly, paying special attention to the area where your surgery will be performed. ? ?Thoroughly rinse your body with warm water from the neck down. ? ?DO NOT shower/wash with your normal soap after using and rinsing off the CHG Soap. ? ?Pat yourself dry with a CLEAN TOWEL. ? ?Wear CLEAN PAJAMAS to bed the night before surgery ? ?Place CLEAN SHEETS on your bed the night before your surgery ? ?DO NOT SLEEP WITH PETS. ? ? ?Day of Surgery: ?Take a shower with CHG soap. ?Do not wear jewelry or makeup ?Do not wear lotions, powders, perfumes, or deodorant. ?Do not shave 48 hours prior to surgery. ?Do not bring valuables to the hospital.  ?[facility] is not responsible for any belongings or valuables. ?Do not wear nail polish, gel polish, artificial nails, or any other type of covering on natural nails (fingers and toes) ?If you have artificial nails or gel coating that need to be removed by a nail salon, please have this removed prior to surgery. Artificial nails or gel coating may interfere with anesthesia's ability to adequately monitor your vital  signs. ?Wear Clean/Comfortable clothing the morning of surgery ?Do not apply any deodorants/lotions.   ?Remember to brush your teeth WITH YOUR REGULAR TOOTHPASTE. ?  ?Please read over the following fact she

## 2021-10-08 ENCOUNTER — Other Ambulatory Visit: Payer: Self-pay | Admitting: Internal Medicine

## 2021-10-08 ENCOUNTER — Other Ambulatory Visit: Payer: Self-pay

## 2021-10-08 DIAGNOSIS — K219 Gastro-esophageal reflux disease without esophagitis: Secondary | ICD-10-CM

## 2021-10-08 NOTE — Progress Notes (Addendum)
Anesthesia Chart Review: ? Case: [id number] Date/Time: 10/12/21 0715  ? Procedure: RIGHT SHOULDER ARTHROSCOPY WITH ROTATOR CUFF REPAIR AND BICEPS TENODESIS (Right: Shoulder)  ? Anesthesia type: General  ? Pre-op diagnosis: right shoulder rotator cuff tear, biceps tendinosis, superior labrum anterior posterior tear  ? Location: [facility] OR ROOM 06 / [facility]  ? Surgeons: Marybelle Killings, MD  ? ?  ? ? ?DISCUSSION: Patient is a 66 year old female scheduled for the above procedure.  He was initially scheduled at a [place] surgery center earlier this month, but she was treated for a COPD exacerbation on 09/25/21, but so anesthesiologist recommended postponing surgery to she had fully recovered and felt she was likely more appropriate for [place], otherwise could consider anesthesia consult in person if out-patient setting was still being considered. Surgery delayed until 10/12/21. 10/05/21 CXR was without acute process.  ? ?History includes smoking, CAD (NSTEMI 11/06/09 + cocaine,, s/p BMS RCA 11/08/09 & s/p BMS LAD and BMS D1 11/09/09; s/p DES RCA for ISR 01/29/10; CTO RCA due to ISR with left-to-right collaterals, 50% of proximal LAD stent, 40-50% D1 ISR, 70% mOM2, 50% pOM3, aggressive medical therapy 03/26/17), HTN, HLD, exertional dyspnea, psoriasis, GERD, hypothyroidism, DM2, CKD ("recurrent UTIs), bruises easily (on anti-platelet therapy), asthma, chronic back pain, GERD, hiatal hernia, spinal surgery.  ? ?She had preoperative evaluation by Dr. Wynetta Emery on 09/14/2021.  Patient reported compliance with medications, no chest pain with rest or exertion, no recent nitroglycerin use.  She was able to help a friend clean her house once a week as well as her daughter's house weekly.  She was able to push a vacuum without chest pain.  She could walk about 1 block before having to rest due to her COPD.  Low risk stress test in May 2021.  At that time her last COPD flare was in December 2022. She wrote, "Her revised cardiac risk index based on  her medical history and type of surgery that she will be having is about 1%. ?Her cardiac and pulmonary status are maximized at this time and I do not think she needs any further cardiovascular testing prior to surgery.  Patient to continue beta-blocker and her other blood pressure medications.  She will hold Brilinta for 1 week prior to the procedure. ?Strongly encouraged her to try to discontinue smoking prior to the procedure." As above, since then she was treated out-patient for COPD exacerbation on 09/25/21 and surgery rescheduled for 10/12/21. No SOB, cough, fever, or chest pain per PAT RN interview.  ? ?Patient had a hematology evaluation by Dr. Irene Limbo on 10/03/2021 for thrombocytosis. PLT count 623K 10/03/21 (up fro [id number] from 06/2021). Office note is not yet finalized.  It appears that Dr. Wynetta Emery had wanted her to see hematology prior to surgery. ? ?She is scheduled for preoperative visit with Benjiman Core, PA-C with Orthopedic surgery on 10/10/21. Reported last Brilinta 10/05/21 and instructed to clarify with surgeon ASA instructions. ? ?Chart will be left for follow-up. ? ?ADDENDUM 10/10/21 11:23 AM: Signed note from Dr. Irene Limbo reviewed. Ferritin low normal. JAK2 is still in process. Patient is being evaluated by Benjiman Core, PA-C today for her preoperative visit. I don't think he was aware she had recent ED visit and hematology evaluation until she presented for evaluation. He reviewed notes. He was going to see if patient felt recovered from her COPD exacerbation. He wanted an anesthesiologist to review clearance from Dr. Wynetta Emery since it included both a medical and cardiac clearance (and  last visit with Dr. Claiborne Billings was in July 2021). I spoke with Tamela Gammon, MD. Dr. Wynetta Emery clearly evaluated patient's cardiac risk and did not advise further testing and provided Brilinta instructions, so unless new CV symptoms or new recommendations from Dr. Wynetta Emery or surgeon, then primary issue currently is felt to be her  recent COPD exacerbation. Recommendation that elective surgery be postponed for another month (6 weeks total from exacerbation) to allow for further recovery. Sherrie at Dr. Lorin Mercy' office notified. They plan to ask Dr. Wynetta Emery to update patient's clearance status prior to when surgery is rescheduled.  ? ? ?VS: BP 129/70   Pulse 69   Temp 37.2 ?C (Oral)   Resp 18   Ht '5\' 1"'$  (1.549 m)   Wt 65.4 kg   SpO2 96%   BMI 27.23 kg/m?  ? ? ?PROVIDERS: ?Ladell Pier, MD is PCP ?- Shelva Majestic, MD is cardiologist. Last visit 02/10/20. He gave permission to hold Brilinta for an endoscopic procedure on 01/08/21.  ?- Sullivan Lone, MD is hematologist. New evaluation 10/03/21 for thrombocytosis. ?- Asencion Noble, MD is pulmonologist.  Last visit 02/26/2021. ? ? ?LABS: Last labs from 09/13/21 and 10/05/21 reviewed. Results include: ?Lab Results  ?Component Value Date  ? WBC 10.4 10/03/2021  ? HGB 12.6 10/03/2021  ? HCT 40.0 10/03/2021  ? PLT 623 (H) 10/03/2021  ? GLUCOSE 104 (H) 10/03/2021  ? ALT 11 10/03/2021  ? AST 11 (L) 10/03/2021  ? NA 139 10/03/2021  ? K 4.0 10/03/2021  ? CL 105 10/03/2021  ? CREATININE 0.78 10/03/2021  ? BUN 10 10/03/2021  ? CO2 28 10/03/2021  ? TSH 5.450 (H) 09/14/2021  ? HGBA1C 5.8 (H) 10/05/2021  ?- Levothyroxine increased from 100 mcg to 112 mcg daily on 09/18/21 by Dr. Wynetta Emery. ? ? ?IMAGES: ?CXR 10/05/21: ?FINDINGS: ?- Cardiomediastinal silhouette unchanged in size and contour. No ?evidence of central vascular congestion. No interlobular septal ?thickening. ?- Linear opacity at the bilateral lung bases compatible with ?scarring/atelectasis. ?- No pneumothorax or pleural effusion. Coarsened interstitial ?markings, with no confluent airspace disease. ?- No acute displaced fracture. Degenerative changes of the spine. ?IMPRESSION: ?Negative for acute cardiopulmonary disease ? ?MRI Right Shoulder 04/21/21: ?IMPRESSION: ?1. Mild tendinosis of the supraspinatus tendon with a small ?partial-thickness bursal  surface tear along the anterior-most ?aspect. ?2. Mild tendinosis of the infraspinatus tendon. ?3. Mild tendinosis of the subscapularis tendon with subcortical ?reactive marrow changes. ?  ?MRI C-spine 03/16/21: ?IMPRESSION: ?1. Cervical spondylosis most pronounced at the C6-7 level where ?there is a disc osteophyte complex with right paracentral disc ?protrusion resulting in moderate right foraminal stenosis and mild ?left foraminal stenosis as well as mild canal stenosis. ?2. Central disc protrusions at C4-5 and C5-6 contribute to mild ?canal stenosis at both levels. ?  ? ? ?EKG: 07/04/21:  ?Sinus rhythm ?Borderline right axis deviation ?Baseline wander ?since last tracing no significant change ?Confirmed by Daleen Bo [id number]) on 07/04/2021 1:51:47 PM ? ? ?CV: ?Nuclear stress test 11/25/19: ?Nuclear stress EF: 69%. ?The left ventricular ejection fraction is hyperdynamic (>65%). ?There was no ST segment deviation noted during stress. ?The study is normal. ?This is a low risk study. ? ? ?Echo 03/26/17: ?Study Conclusions  ?- Left ventricle: The cavity size was normal. Systolic function was  ?  normal. The estimated ejection fraction was in the range of 55%  ?  to 60%. Wall motion was normal; there were no regional wall  ?  motion abnormalities. Doppler parameters are consistent  with  ?  abnormal left ventricular relaxation (grade 1 diastolic  ?  dysfunction).  ? ? ?Cardiac cath 03/26/17: ?Chronic total occlusion of the right coronary due to in-stent restenosis. Left-to-right collaterals are noted. ?Diffuse 50% narrowing of the proximal LAD stent. Diffuse 40-50% in-stent restenosis of the first diagonal. ?70% mid second obtuse marginal stenosis. ?50% proximal third obtuse marginal stenosis. ?Widely patent left main coronary artery. ?Normal left ventricular systolic function, EF 96%, with EDP 5 mmHg. ?  ?RECOMMENDATIONS: ?Aggressive risk factor modification ?Care management to help with acquiring medication. ? ?Past  Medical History:  ?Diagnosis Date  ? Allergy   ? seasonal  ? Anxiety   ? takes Lexapro daily  ? Arthritis   ? "back, from neck down pass my bra area" (03/25/2017)  ? Asthma   ? Bartholin gland cyst 08/29/2011  ?

## 2021-10-10 ENCOUNTER — Other Ambulatory Visit: Payer: Self-pay

## 2021-10-10 ENCOUNTER — Encounter: Payer: Self-pay | Admitting: Surgery

## 2021-10-10 ENCOUNTER — Ambulatory Visit (INDEPENDENT_AMBULATORY_CARE_PROVIDER_SITE_OTHER): Payer: Medicare Other | Admitting: Surgery

## 2021-10-10 VITALS — BP 116/69 | HR 75 | Temp 98.7°F

## 2021-10-10 DIAGNOSIS — M7581 Other shoulder lesions, right shoulder: Secondary | ICD-10-CM

## 2021-10-10 MED ORDER — OMEPRAZOLE 40 MG PO CPDR
DELAYED_RELEASE_CAPSULE | ORAL | 2 refills | Status: DC
  Filled 2021-10-10: qty 60, 30d supply, fill #0

## 2021-10-10 NOTE — Progress Notes (Signed)
66 year old white female with history of right shoulder impingement, rotator cuff tear and biceps tendinosis comes in for preop evaluation.  States that symptoms unchanged from previous visit and she is wanting to proceed with right shoulder arthroscopy with rotator cuff repair and possible biceps tenodesis.  Today he history and physical performed.  I reviewed patient's chart.  Primary care provider Dr. Karle Plumber has cleared patient from a medical and cardiac standpoint and was seen by her September 14, 2021.  Patient was seen in the emergency room September 25, 2021 for COPD exacerbation but has not been back to see her PCP.  Patient also has history of coronary artery disease with stent placement.  She was last seen by cardiologist Dr. Shelva Majestic February 10, 2020.  In that note he stated that he wanted to see patient back in 12 months but she did not return.  Patient stated that she understood that he did not want to see her back and she was released.  Patient also followed by Dr. Joya Gaskins for pulmonary issues.  Today she admits to having ongoing chest congestion and productive cough with white sputum.  States that this is her normal.  She completed antibiotics and prednisone taper that was given at the emergency department. ? ? ?Plan ?On exam patient does have diminished breath sounds bilaterally.  Few scattered wheezes.  I spoke with our surgery scheduler who did talk to [place] PA at the hospital.  Anesthesiologist recommended postponing patient's surgery.  I think this is a good idea.  Patient needs clearances from cardiology and pulmonary for rescheduling. ?

## 2021-10-11 ENCOUNTER — Encounter: Payer: Self-pay | Admitting: Orthopaedic Surgery

## 2021-10-12 ENCOUNTER — Telehealth: Payer: Self-pay | Admitting: *Deleted

## 2021-10-12 ENCOUNTER — Encounter (HOSPITAL_COMMUNITY): Admission: RE | Payer: Self-pay | Source: Home / Self Care

## 2021-10-12 ENCOUNTER — Ambulatory Visit (HOSPITAL_COMMUNITY): Admission: RE | Admit: 2021-10-12 | Payer: Medicare Other | Source: Home / Self Care | Admitting: Orthopaedic Surgery

## 2021-10-12 ENCOUNTER — Other Ambulatory Visit: Payer: Self-pay

## 2021-10-12 DIAGNOSIS — Z01818 Encounter for other preprocedural examination: Secondary | ICD-10-CM

## 2021-10-12 SURGERY — SHOULDER ARTHROSCOPY WITH ROTATOR CUFF REPAIR AND OPEN BICEPS TENODESIS
Anesthesia: General | Site: Shoulder | Laterality: Right

## 2021-10-12 NOTE — Telephone Encounter (Signed)
? ?  Pre-operative Risk Assessment  ?  ?Patient Name: [name] [name]  ?DOB: 1956/01/27 ?MRN: [id number]  ? ?  ? ?Request for Surgical Clearance   ? ?Procedure:   RIGHT SHOULDER ARTHROSCOPY, ROTATOR CUFF REPAIR ? ?Date of Surgery:  Clearance TBD                              ?   ?Surgeon:  DR. MARK YATES ?Surgeon's Group or Practice Name:  [facility] ?Phone number:  [phone] ?Fax number:  [phone] ATTN: SHERRIE ?  ?Type of Clearance Requested:   ?- Medical  ?- Pharmacy:  Hold Aspirin and Ticagrelor (Brilinta)   ?  ?Type of Anesthesia:  General  & BLOCK ?  ?Additional requests/questions:   ? ?Signed, ?Julaine Hua   ?10/12/2021, 1:16 PM  ? ?

## 2021-10-12 NOTE — Telephone Encounter (Signed)
Primary Cardiologist:Thomas Claiborne Billings, MD ? ?Chart reviewed as part of pre-operative protocol coverage. Because of [name] [name]'s past medical history and time since last visit, [name] [name] will require a follow-up visit in order to better assess preoperative cardiovascular risk. ? ?Pre-op covering staff: ?- Please schedule appointment and call [name] [name] to inform them. ?- Please contact requesting surgeon's office via preferred method (i.e, phone, fax) to inform them of need for appointment prior to surgery. ? ?If applicable, this message will also be routed to pharmacy pool and/or primary cardiologist for input on holding anticoagulant/antiplatelet agent as requested below so that this information is available at time of [name] [name]'s appointment.  ? ?[name], NP  ?10/12/2021, 2:22 PM  ? ?

## 2021-10-12 NOTE — Telephone Encounter (Signed)
Pt has been scheduled to see Coletta Memos, NP, 10/17/21, clearance will be addressed at that time. ? ?Will route back to the requesting surgeon's office to make them aware. ?

## 2021-10-14 NOTE — Progress Notes (Signed)
? ?Cardiology Clinic Note  ? ?Patient Name: [name] [name] ?Date of Encounter: 10/17/2021 ? ?Primary Care Provider:  Ladell Pier, MD ?Primary Cardiologist:  Shelva Majestic, MD ? ?Patient Profile  ?  ?[name] [name] 66 year old female presents the clinic today for follow-up evaluation of her coronary artery disease and preoperative cardiac evaluation. ? ?Past Medical History  ?  ?Past Medical History:  ?Diagnosis Date  ? Allergy   ? seasonal  ? Anxiety   ? takes Lexapro daily  ? Arthritis   ? "back, from neck down pass my bra area" (03/25/2017)  ? Asthma   ? Bartholin gland cyst 08/29/2011  ? Bruises easily   ? pt is on Effient  ? Chronic back pain   ? herniated nucleus pulposus  ? Chronic back pain   ? "neck to bra area; lower back" (03/25/2017)  ? Chronic kidney disease   ? recurrent UTI's this year 2022  ? COPD (chronic obstructive pulmonary disease) ([place])   ? early stages  ? Coronary artery disease   ? Depression   ? takes Klonopin daily  ? Diabetes mellitus without complication ([place])   ? Diverticulosis   ? GERD (gastroesophageal reflux disease)   ? takes Nexium daily  ? H/O hiatal hernia   ? Heart attack [facility]) 2011  ? Hemorrhoids   ? Hernia   ? Hyperlipidemia   ? takes Lipitor daily  ? Hypertension   ? takes Losartan daily and Labetalol bid  ? Hypothyroidism   ? takes Synthroid daily  ? Insomnia   ? hydroxyzine prn  ? Joint pain   ? Pneumonia   ? "couple times" (03/25/2017)  ? Pre-diabetes   ? "just found out 1 wk ago" (03/25/2017)  ? Psoriasis   ? elbows,knees,back  ? Shortness of breath   ? with exertion  ? Slowing of urinary stream   ? Stress incontinence   ? ?Past Surgical History:  ?Procedure Laterality Date  ? ABDOMINAL EXPLORATION SURGERY  1977  ? ABDOMINAL HYSTERECTOMY  1977  ? "left one of my ovaries"  ? BACK SURGERY    ? BIOPSY  05/27/2019  ? Procedure: BIOPSY;  Surgeon: Daneil Dolin, MD;  [facility]: [facility];  Service: Endoscopy;;  ? COLONOSCOPY  2011  ? Dr. Ardis Hughs: Mild diverticulosis,  descending diminutive colon polyp (not retrieved), next colonoscopy 10 years  ? CORONARY ANGIOPLASTY WITH STENT PLACEMENT  2011 X2  ? "regular stents didn't work; had to go back in in ~ 1 month and put in medicated stents"  ? DILATION AND CURETTAGE OF UTERUS    ? ESOPHAGOGASTRODUODENOSCOPY    ? ESOPHAGOGASTRODUODENOSCOPY (EGD) WITH PROPOFOL N/A 05/27/2019  ? normal esophagus, dilation, erosive gastropathy s/p biopsy, normal duodenum. Negative H.pylori.   ? LEFT HEART CATH AND CORONARY ANGIOGRAPHY N/A 03/26/2017  ? Procedure: LEFT HEART CATH AND CORONARY ANGIOGRAPHY;  Surgeon: Belva Crome, MD;  Location: [facility];  Service: Cardiovascular;  Laterality: N/A;  ? LUMBAR LAMINECTOMY/DECOMPRESSION MICRODISCECTOMY  08/05/2011  ? Procedure: LUMBAR LAMINECTOMY/DECOMPRESSION MICRODISCECTOMY;  Surgeon: Otilio Connors, MD;  Location: [facility] OR[facility];  Service: Neurosurgery;  Laterality: Right;  Right Lumbar four-five extraforaminal discectomy  ? MALONEY DILATION N/A 05/27/2019  ? Procedure: MALONEY DILATION;  Surgeon: Daneil Dolin, MD;  [facility]: [facility];  Service: Endoscopy;  Laterality: N/A;  54  ? TONSILLECTOMY    ? as a child  ? UPPER GASTROINTESTINAL ENDOSCOPY    ? ? ?Allergies ? ?Allergies  ?  Allergen Reactions  ? Avocado Anaphylaxis  ? Latex Shortness Of Breath and Rash  ? Codeine Nausea Only  ? ? ?History of Present Illness  ?  ?[name] [name] is a PMH of coronary artery disease, hyperlipidemia, hypertension, prediabetes, anxiety and depression, and tobacco abuse. ? ?She underwent cardiac catheterization in 2001 and received stenting to her RCA by Dr. Gwenlyn Found with BMS.  The following day she underwent staged catheterization to her proximal and mid LAD with a cutting balloon arthrectomy to the ostium of her diagonal vessel.  In 7/11 she underwent reintervention to her RCA for in-stent restenosis and had DES overlapping stents placed. ? ?She was seen 9/13 in follow-up.  During that time she noted  claudication and lower extremity swelling. ? ?Her anxiety and depression are followed by [facility].  She was evaluated on 07/09/2013 after experiencing 1 week of increased depression and anxiety and tearfulness with suicidal ideation.  She had been drinking excessively and used crack cocaine.  She reported she has been hearing voices. ? ?She underwent cardiac catheterization 9/18 by Dr. Tamala Julian which showed chronic total occlusion of her RCA due to in-stent restenosis with left-to-right collaterals, diffuse 50% narrowing of her proximal LAD, diffuse 40-50% in-stent stenosis in the first diagonal, 70% mid OM 2 stenosis and 50% proximal OM 3 stenosis.  Her EF was 65%.  Medical management was recommended.  Echocardiogram at that time showed EF of 55-60%, G1 DD and no regional wall motion abnormalities.  She was noted to be a hyper responder to Plavix and was switched to Brilinta.  She underwent nuclear stress test 5/21 which showed low risk and normal perfusion. ? ?She presents to the clinic today for follow-up evaluation and preoperative cardiac evaluation.  She states she notices some increased work of breathing with bending over.  She continues to do well all her activities of daily living housecleaning.  She continues to work as a Engineer, building services 1 day/week.  She does have some trouble with her breathing with vacuuming.  She reports that when she uses her albuterol, Trelegy, and nebulizing treatments her breathing is somewhat better.  We reviewed her recent lipid panel which shows well-controlled LDL.  She has been smoking 1 pack/day and has been trying to cut back.  I will give her smoking cessation information, have her increase her physical activity as tolerated, and plan follow-up for 6 months. ? ?Today she denies chest pain, shortness of breath, lower extremity edema, fatigue, palpitations, melena, hematuria, hemoptysis, diaphoresis, weakness, presyncope, syncope, orthopnea, and PND. ? ? ?Home  Medications  ?  ?Prior to Admission medications   ?Medication Sig Start Date End Date Taking? Authorizing Provider  ?omeprazole (PRILOSEC) 40 MG capsule TAKE 1 CAPSULE (40 MG TOTAL) BY MOUTH 2 (TWO) TIMES DAILY BEFORE A MEAL. 10/10/21 10/10/22  Ladell Pier, MD  ?albuterol (VENTOLIN HFA) 108 (90 Base) MCG/ACT inhaler INHALE 2 PUFFS INTO THE LUNGS EVERY 4-6 HOURS AS NEEDED FOR WHEEZING OR SHORTNESS OF BREATH. 09/25/21   Barrett Henle, MD  ?amLODipine (NORVASC) 5 MG tablet TAKE 1 TABLET (5 MG TOTAL) BY MOUTH DAILY. 07/12/21 07/12/22  Ladell Pier, MD  ?aspirin EC 81 MG tablet Take 81 mg by mouth daily.    [provider]  ?atorvastatin (LIPITOR) 80 MG tablet Take 1 tablet (80 mg total) by mouth daily. please schedule appointment for more refills 05/04/21 10/02/21  Camillia Herter, NP  ?Blood Glucose Monitoring Suppl (TRUE METRIX METER) DEVI 1 kit by Does  not apply route 4 (four) times daily. 04/04/17   Brayton Caves, PA-C  ?busPIRone (BUSPAR) 15 MG tablet TAKE 2 TABLETS IN THE MORNING, 1 TAB AT NOON AND 1 TAB IN THE EVENING ?Patient taking differently: Take 15 mg by mouth 2 (two) times daily. 08/30/21 08/30/22  Nwoko, Terese Door, PA  ?clonazePAM (KLONOPIN) 0.5 MG tablet Take 1 tablet (0.5 mg total) by mouth 2 (two) times daily as needed for anxiety. 08/01/21   Nwoko, Terese Door, PA  ?doxepin (SINEQUAN) 50 MG capsule TAKE 1 CAPSULE (50 MG TOTAL) BY MOUTH AT BEDTIME. 08/30/21 08/30/22  Nwoko, Terese Door, PA  ?escitalopram (LEXAPRO) 10 MG tablet TAKE 3 TABLETS (30 MG TOTAL) BY MOUTH DAILY. ?Patient taking differently: Take 10 mg by mouth in the morning and at bedtime. 08/30/21 08/30/22  Malachy Mood, PA  ?Fluticasone-Umeclidin-Vilant (TRELEGY ELLIPTA) 100-62.5-25 MCG/ACT AEPB Inhale 1 puff into the lungs daily 02/26/21   Elsie Stain, MD  ?furosemide (LASIX) 20 MG tablet Take 1 tablet (20 mg total) by mouth daily. 07/26/21   Ladell Pier, MD  ?gabapentin (NEURONTIN) 600 MG tablet TAKE 1 TABLET  BY MOUTH 4 TIMES DAILY 09/29/20 10/02/21  Ladell Pier, MD  ?glucose blood (TRUE METRIX BLOOD GLUCOSE TEST) test strip Use as instructed 01/21/18   Ladell Pier, MD  ?ipratropium-albuterol (DUONEB) 0.5-2.5 (3) MG/3ML SOLN

## 2021-10-15 LAB — JAK2 (INCLUDING V617F AND EXON 12), MPL,& CALR-NEXT GEN SEQ

## 2021-10-16 ENCOUNTER — Encounter: Payer: Self-pay | Admitting: Hematology

## 2021-10-16 ENCOUNTER — Encounter: Payer: Medicare Other | Admitting: Orthopaedic Surgery

## 2021-10-17 ENCOUNTER — Other Ambulatory Visit: Payer: Self-pay

## 2021-10-17 ENCOUNTER — Encounter: Payer: Self-pay | Admitting: General Practice

## 2021-10-17 ENCOUNTER — Ambulatory Visit (INDEPENDENT_AMBULATORY_CARE_PROVIDER_SITE_OTHER): Payer: Medicare Other | Admitting: General Practice

## 2021-10-17 VITALS — BP 124/62 | HR 64 | Ht 61.5 in | Wt 143.2 lb

## 2021-10-17 DIAGNOSIS — I10 Essential (primary) hypertension: Secondary | ICD-10-CM

## 2021-10-17 DIAGNOSIS — I25119 Atherosclerotic heart disease of native coronary artery with unspecified angina pectoris: Secondary | ICD-10-CM | POA: Diagnosis not present

## 2021-10-17 DIAGNOSIS — Z01810 Encounter for preprocedural cardiovascular examination: Secondary | ICD-10-CM

## 2021-10-17 DIAGNOSIS — E782 Mixed hyperlipidemia: Secondary | ICD-10-CM

## 2021-10-17 DIAGNOSIS — Z720 Tobacco use: Secondary | ICD-10-CM

## 2021-10-17 MED ORDER — NITROGLYCERIN 0.4 MG SL SUBL
0.4000 mg | SUBLINGUAL_TABLET | SUBLINGUAL | 3 refills | Status: DC | PRN
  Filled 2021-10-17: qty 25, 30d supply, fill #0

## 2021-10-17 NOTE — Patient Instructions (Signed)
Medication Instructions:  ?MAY HOLD BRILINTA 5 DAYS PRIOR AND ?ASPIRIN 7 DAYS PRIOR ? ?*If you need a refill on your cardiac medications before your next appointment, please call your pharmacy* ? ?Lab Work:   Testing/Procedures:  ?NONE    NONE ? ?Special Instructions ? ?PLEASE INCREASE PHYSICAL ACTIVITY AS TOLERATED  ? ?Follow-Up: ?Your next appointment:  6 month(s) In Person with Shelva Majestic, MD   ? ?Please call our office 2 months in advance to schedule this appointment  :1 ? ?At [facility], you and your health needs are our priority.  As part of our continuing mission to provide you with exceptional heart care, we have created designated Provider Care Teams.  These Care Teams include your primary Cardiologist (physician) and Advanced Practice Providers (APPs -  Physician Assistants and Nurse Practitioners) who all work together to provide you with the care you need, when you need it. ? ? ? ?      ? ? ? ?  ?

## 2021-10-18 ENCOUNTER — Inpatient Hospital Stay: Payer: Medicare Other | Attending: Hematology | Admitting: Hematology

## 2021-10-18 DIAGNOSIS — D509 Iron deficiency anemia, unspecified: Secondary | ICD-10-CM | POA: Diagnosis not present

## 2021-10-18 DIAGNOSIS — D75839 Thrombocytosis, unspecified: Secondary | ICD-10-CM

## 2021-10-18 NOTE — Addendum Note (Signed)
Addended by: Waylan Rocher on: 10/18/2021 01:32 PM ? ? Modules accepted: Orders ? ?

## 2021-10-18 NOTE — Progress Notes (Signed)
. ? ? ?HEMATOLOGY/ONCOLOGY PHONE VISIT NOTE ? ?Date of Service: 10/18/2021 ? ?Patient Care Team: ?Ladell Pier, MD as PCP - General (Internal Medicine) ?Troy Sine, MD as PCP - Cardiology (Cardiology) ?Rourk, [name], MD as Consulting Physician (Gastroenterology)  ? 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 ?HISTORY OF PRESENTING ILLNESS:  ?Please see previous note for details on initial presentation ? ?INTERVAL HISTORY: ?I connected with [name] [name] on 10/18/2021 at 1:20PM EST by telephone visit and verified that I am speaking with the correct person using two identifiers.  ? ?I discussed the limitations, risks, security and privacy concerns of performing an evaluation and management service by telemedicine and the availability of in-person appointments. I also discussed with the patient that there may be a patient responsible charge related to this service. The patient expressed understanding and agreed to proceed.  ? ?Other persons participating in the visit and their role in the encounter: None ? ?Patient?s location: Home ?Provider?s location: [place] ? ?[name] [name] is a 66 y.o. 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 She reports She is doing well with no new symptoms or concerns. ? ?We discussed doing IV iron replacement which she was agreeable to. ? ?We discussed that smoking and iron deficiency anemia are etiologies for thrombocytosis.  ? ?We recommend pursuing GI workup with Dr. Owens Loffler due to ongoing blood loss in the context of dual antiplatelet therapy and NSAIDS. ? ?No abnormal bleeding. ?No other new or acute symptoms. ? ?We discussed labs done 10/03/2021 were  discussed with her in detail ?CBC stable with elevated RDW of 18% and thrombocytosis with platelets of 623K. Slightly elevated AMC at 1.1 likely due to  smoking ?Iron saturation is 13% ?Ferritin 13. ? 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 ?MEDICAL HISTORY:  ?Past Medical History:  ?Diagnosis Date  ? Allergy   ? seasonal  ? Anxiety   ? takes Lexapro daily  ? Arthritis   ? "back, from neck down pass my bra area" (03/25/2017)  ? Asthma   ? Bartholin gland cyst 08/29/2011  ? Bruises easily   ? pt is on Effient  ? Chronic back pain   ? herniated nucleus pulposus  ? Chronic back pain   ? "neck to bra area; lower back" (03/25/2017)  ? Chronic kidney disease   ? recurrent UTI's this year 2022  ? COPD (chronic obstructive pulmonary disease) ([place])   ? early stages  ? Coronary artery disease   ? Depression   ? takes Klonopin daily  ? Diabetes mellitus without complication ([place])   ? Diverticulosis   ? GERD (gastroesophageal reflux disease)   ? takes Nexium daily  ? H/O hiatal hernia   ? Heart attack [facility]) 2011  ? Hemorrhoids   ? Hernia   ? Hyperlipidemia   ? takes Lipitor daily  ? Hypertension   ? takes Losartan daily and Labetalol bid  ? Hypothyroidism   ? takes Synthroid daily  ? Insomnia   ? hydroxyzine prn  ? Joint pain   ? Pneumonia   ? "couple times" (03/25/2017)  ? Pre-diabetes   ? "just found out 1 wk ago" (03/25/2017)  ? Psoriasis   ? elbows,knees,back  ? Shortness of breath   ? with exertion  ?  Slowing of urinary stream   ? Stress incontinence   ? ? ?SURGICAL HISTORY: ?Past Surgical History:  ?Procedure Laterality Date  ? ABDOMINAL EXPLORATION SURGERY  1977  ? ABDOMINAL HYSTERECTOMY  1977  ? "left one of my ovaries"  ? BACK SURGERY    ? BIOPSY  05/27/2019  ? Procedure: BIOPSY;  Surgeon: Daneil Dolin, MD;  [facility];  Service: Endoscopy;;  ? COLONOSCOPY  2011  ? Dr. Ardis Hughs: Mild diverticulosis, descending diminutive colon polyp (not retrieved), next colonoscopy 10 years  ? CORONARY ANGIOPLASTY WITH STENT PLACEMENT  2011 X2  ? "regular stents didn't work; had  to go back in in ~ 1 month and put in medicated stents"  ? DILATION AND CURETTAGE OF UTERUS    ? ESOPHAGOGASTRODUODENOSCOPY    ? ESOPHAGOGASTRODUODENOSCOPY (EGD) WITH PROPOFOL N/A 05/27/2019  ? normal esophagus, dilation, erosive gastropathy s/p biopsy, normal duodenum. Negative H.pylori.   ? LEFT HEART CATH AND CORONARY ANGIOGRAPHY N/A 03/26/2017  ? Procedure: LEFT HEART CATH AND CORONARY ANGIOGRAPHY;  Surgeon: Belva Crome, MD;  Location: [place] [facility];  Service: Cardiovascular;  Laterality: N/A;  ? LUMBAR LAMINECTOMY/DECOMPRESSION MICRODISCECTOMY  08/05/2011  ? Procedure: LUMBAR LAMINECTOMY/DECOMPRESSION MICRODISCECTOMY;  Surgeon: Otilio Connors, MD;  Location: [place] [facility];  Service: Neurosurgery;  Laterality: Right;  Right Lumbar four-five extraforaminal discectomy  ? MALONEY DILATION N/A 05/27/2019  ? Procedure: MALONEY DILATION;  Surgeon: Daneil Dolin, MD;  [facility];  Service: Endoscopy;  Laterality: N/A;  54  ? TONSILLECTOMY    ? as a child  ? UPPER GASTROINTESTINAL ENDOSCOPY    ? ? ?SOCIAL HISTORY: ?Social History  ? ?Socioeconomic History  ? Marital status: Divorced  ?  Spouse name: Not on file  ? Number of children: Not on file  ? Years of education: Not on file  ? Highest education level: Not on file  ?Occupational History  ? Not on file  ?Tobacco Use  ? Smoking status: Every Day  ?  Packs/day: 1.00  ?  Years: 50.00  ?  Pack years: 50.00  ?  Types: Cigarettes  ? Smokeless tobacco: Never  ?Vaping Use  ? Vaping Use: Some days  ? Substances: Nicotine  ?Substance and Sexual Activity  ? Alcohol use: Yes  ?  Comment: once a week  ? Drug use: No  ?  Types: Cocaine  ?  Comment: has been years per pt  ? Sexual activity: Not Currently  ?  Birth control/protection: Surgical  ?Other Topics Concern  ? Not on file  ?Social History Narrative  ? Not on file  ? ?Social Determinants of Health  ? ?Financial Resource Strain: Not on file  ?Food Insecurity: Not on file  ?Transportation Needs: Not on  file  ?Physical Activity: Not on file  ?Stress: Not on file  ?Social Connections: Not on file  ?Intimate Partner Violence: Not on file  ? ? ?FAMILY HISTORY: ?Family History  ?Problem Relation Age of Onset  ? Heart disease Mother   ? Hypertension Mother   ? Stroke Mother   ? Mental illness Mother   ? Heart disease Father   ? Hypertension Father   ? Diabetes Father   ? Colon polyps Brother   ? Breast cancer Maternal Aunt   ? Throat cancer Maternal Uncle   ? Thyroid disease Paternal Aunt   ? Heart disease Maternal Grandfather   ? Cancer Paternal Grandmother   ?     mouth  ?  Anesthesia problems Daughter   ? Hypotension Neg Hx   ? Malignant hyperthermia Neg Hx   ? Pseudochol deficiency Neg Hx   ? Colon cancer Neg Hx   ? Stomach cancer Neg Hx   ? Esophageal cancer Neg Hx   ? Pancreatic cancer Neg Hx   ? Rectal cancer Neg Hx   ? ? ?ALLERGIES:  is allergic to avocado, latex, and codeine. ? ?MEDICATIONS:  ?Current Outpatient Medications  ?Medication Sig Dispense Refill  ? albuterol (VENTOLIN HFA) 108 (90 Base) MCG/ACT inhaler INHALE 2 PUFFS INTO THE LUNGS EVERY 4-6 HOURS AS NEEDED FOR WHEEZING OR SHORTNESS OF BREATH. 18 g 0  ? amLODipine (NORVASC) 5 MG tablet TAKE 1 TABLET (5 MG TOTAL) BY MOUTH DAILY. 90 tablet 1  ? aspirin EC 81 MG tablet Take 81 mg by mouth daily.    ? atorvastatin (LIPITOR) 80 MG tablet Take 1 tablet (80 mg total) by mouth daily. please schedule appointment for more refills 30 tablet 0  ? Blood Glucose Monitoring Suppl (TRUE METRIX METER) DEVI 1 kit by Does not apply route 4 (four) times daily. 1 Device 0  ? busPIRone (BUSPAR) 15 MG tablet TAKE 2 TABLETS IN THE MORNING, 1 TAB AT NOON AND 1 TAB IN THE EVENING (Patient taking differently: Take 15 mg by mouth 2 (two) times daily.) 120 tablet 2  ? clonazePAM (KLONOPIN) 0.5 MG tablet Take 1 tablet (0.5 mg total) by mouth 2 (two) times daily as needed for anxiety. 60 tablet 2  ? doxepin (SINEQUAN) 50 MG capsule TAKE 1 CAPSULE (50 MG TOTAL) BY MOUTH AT BEDTIME.  30 capsule 2  ? escitalopram (LEXAPRO) 10 MG tablet TAKE 3 TABLETS (30 MG TOTAL) BY MOUTH DAILY. (Patient taking differently: Take 10 mg by mouth in the morning and at bedtime.) 90 tablet 2  ? Fluticasone-Um

## 2021-10-22 ENCOUNTER — Other Ambulatory Visit: Payer: Self-pay

## 2021-10-22 ENCOUNTER — Other Ambulatory Visit: Payer: Self-pay | Admitting: Critical Care Medicine

## 2021-10-22 ENCOUNTER — Telehealth: Payer: Self-pay | Admitting: Hematology

## 2021-10-22 MED ORDER — TRELEGY ELLIPTA 100-62.5-25 MCG/ACT IN AEPB
INHALATION_SPRAY | RESPIRATORY_TRACT | 2 refills | Status: DC
  Filled 2021-10-22: qty 180, 90d supply, fill #0

## 2021-10-22 NOTE — Telephone Encounter (Signed)
Scheduled follow-up appointments per 4/6 los. Patient is aware. ?

## 2021-10-23 ENCOUNTER — Other Ambulatory Visit: Payer: Self-pay

## 2021-10-23 ENCOUNTER — Encounter: Payer: Medicare Other | Admitting: Orthopaedic Surgery

## 2021-10-25 ENCOUNTER — Encounter: Payer: Self-pay | Admitting: Internal Medicine

## 2021-10-25 ENCOUNTER — Other Ambulatory Visit: Payer: Self-pay | Admitting: Internal Medicine

## 2021-10-25 ENCOUNTER — Other Ambulatory Visit: Payer: Self-pay

## 2021-10-25 ENCOUNTER — Ambulatory Visit: Payer: Medicare Other | Attending: Internal Medicine | Admitting: Internal Medicine

## 2021-10-25 ENCOUNTER — Other Ambulatory Visit: Payer: Self-pay | Admitting: Pharmacist

## 2021-10-25 VITALS — BP 119/63 | HR 93 | Resp 16 | Wt 142.2 lb

## 2021-10-25 DIAGNOSIS — I2510 Atherosclerotic heart disease of native coronary artery without angina pectoris: Secondary | ICD-10-CM

## 2021-10-25 DIAGNOSIS — D75839 Thrombocytosis, unspecified: Secondary | ICD-10-CM

## 2021-10-25 DIAGNOSIS — I10 Essential (primary) hypertension: Secondary | ICD-10-CM

## 2021-10-25 DIAGNOSIS — F17200 Nicotine dependence, unspecified, uncomplicated: Secondary | ICD-10-CM | POA: Diagnosis not present

## 2021-10-25 DIAGNOSIS — E039 Hypothyroidism, unspecified: Secondary | ICD-10-CM | POA: Diagnosis not present

## 2021-10-25 DIAGNOSIS — Z79899 Other long term (current) drug therapy: Secondary | ICD-10-CM | POA: Diagnosis not present

## 2021-10-25 DIAGNOSIS — F419 Anxiety disorder, unspecified: Secondary | ICD-10-CM | POA: Diagnosis not present

## 2021-10-25 DIAGNOSIS — R7303 Prediabetes: Secondary | ICD-10-CM | POA: Diagnosis not present

## 2021-10-25 DIAGNOSIS — Z01818 Encounter for other preprocedural examination: Secondary | ICD-10-CM | POA: Diagnosis not present

## 2021-10-25 DIAGNOSIS — J449 Chronic obstructive pulmonary disease, unspecified: Secondary | ICD-10-CM | POA: Diagnosis not present

## 2021-10-25 DIAGNOSIS — M25511 Pain in right shoulder: Secondary | ICD-10-CM | POA: Diagnosis not present

## 2021-10-25 DIAGNOSIS — D509 Iron deficiency anemia, unspecified: Secondary | ICD-10-CM | POA: Insufficient documentation

## 2021-10-25 DIAGNOSIS — F32A Depression, unspecified: Secondary | ICD-10-CM | POA: Diagnosis not present

## 2021-10-25 DIAGNOSIS — Z955 Presence of coronary angioplasty implant and graft: Secondary | ICD-10-CM | POA: Diagnosis not present

## 2021-10-25 DIAGNOSIS — E611 Iron deficiency: Secondary | ICD-10-CM

## 2021-10-25 DIAGNOSIS — G8929 Other chronic pain: Secondary | ICD-10-CM

## 2021-10-25 DIAGNOSIS — Z7984 Long term (current) use of oral hypoglycemic drugs: Secondary | ICD-10-CM | POA: Insufficient documentation

## 2021-10-25 DIAGNOSIS — Z7982 Long term (current) use of aspirin: Secondary | ICD-10-CM | POA: Insufficient documentation

## 2021-10-25 MED ORDER — NICOTINE 21 MG/24HR TD PT24
21.0000 mg | MEDICATED_PATCH | Freq: Every day | TRANSDERMAL | 1 refills | Status: DC
  Filled 2021-10-25: qty 28, 28d supply, fill #0

## 2021-10-25 MED ORDER — ALBUTEROL SULFATE HFA 108 (90 BASE) MCG/ACT IN AERS
2.0000 | INHALATION_SPRAY | RESPIRATORY_TRACT | 2 refills | Status: DC | PRN
  Filled 2021-10-25: qty 18, 25d supply, fill #0
  Filled 2021-12-12: qty 18, 25d supply, fill #1
  Filled 2022-01-29: qty 18, 25d supply, fill #2

## 2021-10-25 MED ORDER — TRAMADOL HCL 50 MG PO TABS
50.0000 mg | ORAL_TABLET | Freq: Two times a day (BID) | ORAL | 1 refills | Status: DC | PRN
  Filled 2021-10-25: qty 60, 30d supply, fill #0
  Filled 2021-11-30: qty 60, 30d supply, fill #1

## 2021-10-25 NOTE — Progress Notes (Signed)
? ? ?Patient ID: [name] [name], female    DOB: 1956-06-19  MRN: [id number] ? ?CC: Medical Clearance and Hospitalization Follow-up (ED) ? ? ?Subjective: ?[name] [name] is a 66 y.o. female who presents for pre-op evaluation and ER follow up ?Her concerns today include:  ?Patient with history of pre-DM, GERD, hypothyroidism, HTN, HL, CAD with previous stent, COPD, substance abuse, depression/anxiety and tobacco dependence ? ?Pt was seen by me 09/14/2021 for pre-op clearance for RT shoulder arthroscopic surgery by Dr. Lorin Mercy.  Pt was optimized at that time and was cleared for surgery.  However in the interim patient was seen in the emergency room 09/25/2021 with subsequent flare of COPD for which she was placed on prednisone and doxycycline.  Anesthesiology recommended postponement of her surgery until she was seen by pulmonary and cardiology.  Patient seen by cardiology since then and was assessed to have acceptable risk without further further testing needed.  They recommend that she hold Brilinta for 5 days prior and aspirin for 5 to 7 days prior to procedure. ?-Reports compliance with taking heart blood pressure medications including atorvastatin, amlodipine, Cozaar, Bystolic, Brilinta and aspirin.  No recent use of sublingual nitroglycerin. ? ?I also had patient see Dr. Irene Limbo for persistent thrombocytosis.  Diagnosed with iron deficiency anemia with ferritin level of 13, iron 73 and TIBC 553, 13% sat.  H&H normal range.  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  This can put her at risk for bleeding on thromboses.  However Dr. Grier Mitts note indicates that since she is on dual antiplatelet agents this will help to be protective if this is ET. ?Denies blood in stools; did notice dark stools once about 2 wks ago.  Takes Advil and sometimes Ibuprofen 2 pills 4x/day for pain in the RT shoulder.  Takes Omeprazole daily.  No recent heartburn.   ?No blood in the urine or vaginal bleeding.   ?Had EGD and c-scope last  yr.  Had 3 polyps removed ?-scheduled for 2 iron infusions 1 wk apart starting 11/02/2021. ? ?COPD: Feels she is back to her baseline. No increase cough or phlegm ?Uses Albuterol inhaler at least once a day. Uses Trelegy daily. ?Had CXR 10/05/21 which was negative for any acute disease. ?-still smoking 1 pk a day. Wants to quit prior to surgery.  Request patches ? ?Hypothyroid: dose increase on last visit as TSH level was mildly above goal at 5.4.  Reports compliance with taking the levothyroxine 112 mcg daily. ? ?PreDM:  A1C 3 wks ago was 5.8.  Compliant with taking metformin. ? ?In terms of her mental health, she feels she is stable and is compliant with taking her medications.  Endorses intermittent use of marijuana but no other street drugs. ?   ? ?Patient Active Problem List  ? Diagnosis Date Noted  ? Major depressive disorder, recurrent, in full remission with anxious distress ([place]) 05/31/2021  ? Rotator cuff tendinitis, right 05/08/2021  ? 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  ? Chronic neck pain 02/26/2021  ? GAD (generalized anxiety disorder) 01/08/2021  ? Acute cystitis without hematuria 11/07/2020  ? Influenza vaccine needed 06/15/2020  ? Abdominal pain, epigastric 04/28/2019  ? Esophageal dysphagia 04/28/2019  ? Bipolar I disorder ([place]) 09/17/2018  ? Insomnia 10/28/2017  ? Prediabetes 05/28/2017  ? QT prolongation 03/25/2017  ? Encounter for screening mammogram for breast cancer 09/11/2015  ? Psoriasis 06/22/2015  ? COPD (chronic obstructive pulmonary disease) ([facility])GOLD C  05/18/2015  ? Anxiety and depression 07/10/2013  ?  Essential hypertension, benign 06/07/2013  ? Dyslipidemia 06/07/2013  ? Asthma, chronic 06/07/2013  ? Emphysema lung ([place]) 06/07/2013  ? Chronic back pain 06/07/2013  ? CAD S/P percutaneous coronary angioplasty 06/07/2013  ? GERD (gastroesophageal reflux disease) 06/07/2013  ? Breast lump on left side at 3 o'clock position 10/13/2012  ? S/P abdominal hysterectomy and right  salpingo-oophorectomy 08/29/2011  ? COPD exacerbation ([place]) 09/15/2009  ? TOBACCO ABUSE 07/17/2009  ? Chronic rhinitis 07/17/2009  ? Lung nodule < 6cm on CT 07/17/2009  ? ALLERGY, FOOD 07/17/2009  ? Hypothyroidism 12/22/2008  ?  ? ?Current Outpatient Medications on File Prior to Visit  ?Medication Sig Dispense Refill  ? amLODipine (NORVASC) 5 MG tablet TAKE 1 TABLET (5 MG TOTAL) BY MOUTH DAILY. 90 tablet 1  ? aspirin EC 81 MG tablet Take 81 mg by mouth daily.    ? atorvastatin (LIPITOR) 80 MG tablet Take 1 tablet (80 mg total) by mouth daily. please schedule appointment for more refills 30 tablet 0  ? Blood Glucose Monitoring Suppl (TRUE METRIX METER) DEVI 1 kit by Does not apply route 4 (four) times daily. 1 Device 0  ? busPIRone (BUSPAR) 15 MG tablet TAKE 2 TABLETS IN THE MORNING, 1 TAB AT NOON AND 1 TAB IN THE EVENING (Patient taking differently: Take 15 mg by mouth 2 (two) times daily.) 120 tablet 2  ? clonazePAM (KLONOPIN) 0.5 MG tablet Take 1 tablet (0.5 mg total) by mouth 2 (two) times daily as needed for anxiety. 60 tablet 2  ? doxepin (SINEQUAN) 50 MG capsule TAKE 1 CAPSULE (50 MG TOTAL) BY MOUTH AT BEDTIME. 30 capsule 2  ? escitalopram (LEXAPRO) 10 MG tablet TAKE 3 TABLETS (30 MG TOTAL) BY MOUTH DAILY. (Patient taking differently: Take 10 mg by mouth in the morning and at bedtime.) 90 tablet 2  ? Fluticasone-Umeclidin-Vilant (TRELEGY ELLIPTA) 100-62.5-25 MCG/ACT AEPB Inhale 1 puff into the lungs daily 60 each 2  ? furosemide (LASIX) 20 MG tablet Take 1 tablet (20 mg total) by mouth daily. 30 tablet 2  ? gabapentin (NEURONTIN) 600 MG tablet TAKE 1 TABLET BY MOUTH 4 TIMES DAILY 360 tablet 1  ? glucose blood (TRUE METRIX BLOOD GLUCOSE TEST) test strip Use as instructed 100 each 12  ? ipratropium-albuterol (DUONEB) 0.5-2.5 (3) MG/3ML SOLN TAKE 3 MLS VIA NEBULIZATION EVERY 6 HOURS (Patient taking differently: Take 3 mLs by nebulization every 6 (six) hours as needed (shortness of breath or wheezing).) 360 mL 1   ? levothyroxine (SYNTHROID) 112 MCG tablet Take 1 tablet (112 mcg total) by mouth daily. 30 tablet 6  ? losartan (COZAAR) 50 MG tablet TAKE 1 & 1/2 TABLETS BY MOUTH DAILY. 45 tablet 2  ? metFORMIN (GLUCOPHAGE) 500 MG tablet TAKE 1 TABLET (500 MG TOTAL) BY MOUTH 2 (TWO) TIMES DAILY WITH A MEAL. 60 tablet 3  ? methocarbamol (ROBAXIN) 500 MG tablet Take 1 tablet by mouth 4 times daily (Patient not taking: Reported on 10/02/2021) 42 tablet 1  ? methocarbamol (ROBAXIN) 500 MG tablet Take 1 tablet (500 mg total) by mouth 2 (two) times daily as needed for muscle spasms. 60 tablet 1  ? mirtazapine (REMERON) 30 MG tablet TAKE 1 TABLET (30 MG TOTAL) BY MOUTH AT BEDTIME. 30 tablet 2  ? montelukast (SINGULAIR) 10 MG tablet TAKE 1 TABLET BY MOUTH AT BEDTIME. 30 tablet 2  ? nebivolol (BYSTOLIC) 10 MG tablet TAKE 1 TABLET (10 MG TOTAL) BY MOUTH DAILY. 90 tablet 1  ? nitroGLYCERIN (NITROSTAT) 0.4 MG SL  tablet Place 1 tablet (0.4 mg total) under the tongue every 5 (five) minutes as needed for chest pain. (Patient not taking: Reported on 10/17/2021) 25 tablet 3  ? omeprazole (PRILOSEC) 40 MG capsule TAKE 1 CAPSULE (40 MG TOTAL) BY MOUTH 2 (TWO) TIMES DAILY BEFORE A MEAL. 60 capsule 2  ? promethazine-dextromethorphan (PROMETHAZINE-DM) 6.25-15 MG/5ML syrup Take 5 mLs by mouth 4 (four) times daily as needed for cough. (Patient not taking: Reported on 10/25/2021) 180 mL 0  ? sodium chloride (OCEAN) 0.65 % SOLN nasal spray Place 1 spray into both nostrils as needed for congestion.    ? ticagrelor (BRILINTA) 60 MG TABS tablet TAKE 1 TABLET (60 MG TOTAL) BY MOUTH 2 (TWO) TIMES DAILY. 60 tablet 5  ? TRUEPLUS LANCETS 28G MISC 28 g by Does not apply route QID. 120 each 2  ? ?No current facility-administered medications on file prior to visit.  ? ? ?Allergies  ?Allergen Reactions  ? Avocado Anaphylaxis  ? Latex Shortness Of Breath and Rash  ? Codeine Nausea Only  ? ? ?Social History  ? ?Socioeconomic History  ? Marital status: Divorced  ?  Spouse  name: Not on file  ? Number of children: Not on file  ? Years of education: Not on file  ? Highest education level: Not on file  ?Occupational History  ? Not on file  ?Tobacco Use  ? Smoking status: [place]

## 2021-10-25 NOTE — Patient Instructions (Signed)
Stop Advil, Motrin, Ibuprofen ? ?

## 2021-10-26 ENCOUNTER — Other Ambulatory Visit: Payer: Self-pay

## 2021-10-26 LAB — TSH: TSH: 0.502 u[IU]/mL (ref 0.450–4.500)

## 2021-10-30 ENCOUNTER — Other Ambulatory Visit: Payer: Self-pay | Admitting: Pharmacist

## 2021-10-30 ENCOUNTER — Other Ambulatory Visit: Payer: Self-pay | Admitting: Internal Medicine

## 2021-10-30 ENCOUNTER — Other Ambulatory Visit: Payer: Self-pay

## 2021-10-30 DIAGNOSIS — I25118 Atherosclerotic heart disease of native coronary artery with other forms of angina pectoris: Secondary | ICD-10-CM

## 2021-10-30 MED ORDER — TICAGRELOR 60 MG PO TABS
ORAL_TABLET | ORAL | 5 refills | Status: DC
  Filled 2021-10-30: qty 60, fill #0

## 2021-10-30 MED ORDER — TICAGRELOR 60 MG PO TABS: ORAL_TABLET | ORAL | 5 refills | Status: DC

## 2021-10-31 ENCOUNTER — Encounter: Payer: Self-pay | Admitting: Physician Assistant

## 2021-10-31 ENCOUNTER — Other Ambulatory Visit: Payer: Self-pay

## 2021-10-31 ENCOUNTER — Telehealth: Payer: Self-pay | Admitting: *Deleted

## 2021-10-31 ENCOUNTER — Ambulatory Visit (INDEPENDENT_AMBULATORY_CARE_PROVIDER_SITE_OTHER): Payer: Medicare Other | Admitting: Physician Assistant

## 2021-10-31 VITALS — BP 116/68 | HR 59 | Ht 62.0 in | Wt 142.6 lb

## 2021-10-31 DIAGNOSIS — K219 Gastro-esophageal reflux disease without esophagitis: Secondary | ICD-10-CM | POA: Diagnosis not present

## 2021-10-31 DIAGNOSIS — D509 Iron deficiency anemia, unspecified: Secondary | ICD-10-CM | POA: Diagnosis not present

## 2021-10-31 DIAGNOSIS — Z7901 Long term (current) use of anticoagulants: Secondary | ICD-10-CM | POA: Diagnosis not present

## 2021-10-31 DIAGNOSIS — K921 Melena: Secondary | ICD-10-CM

## 2021-10-31 DIAGNOSIS — I25119 Atherosclerotic heart disease of native coronary artery with unspecified angina pectoris: Secondary | ICD-10-CM | POA: Diagnosis not present

## 2021-10-31 MED ORDER — OMEPRAZOLE 40 MG PO CPDR
DELAYED_RELEASE_CAPSULE | ORAL | 2 refills | Status: DC
  Filled 2021-10-31 – 2021-11-21 (×2): qty 60, 30d supply, fill #0
  Filled 2022-01-04: qty 60, 30d supply, fill #1
  Filled 2022-02-13: qty 60, 30d supply, fill #2

## 2021-10-31 NOTE — Patient Instructions (Addendum)
Your provider has requested that you go to the basement level for lab work the week prior to your procedure (May 1-May 5). Press "B" on the elevator. The lab is located at the first door on the left as you exit the elevator. ? ? ?We have sent the following medications to your pharmacy for you to pick up at your convenience: ?Increase Omeprazole 40 mg to twice daily 30-60 minutes before breakfast and dinner. ? ?You have been scheduled for an endoscopy. Please follow written instructions given to you at your visit today. ?If you use inhalers (even only as needed), please bring them with you on the day of your procedure. ? ?If you are age 40 or older, your body mass index should be between 23-30. Your Body mass index is 26.08 kg/m?Marland Kitchen If this is out of the aforementioned range listed, please consider follow up with your Primary Care Provider. ? ?If you are age 32 or younger, your body mass index should be between 19-25. Your Body mass index is 26.08 kg/m?Marland Kitchen If this is out of the aformentioned range listed, please consider follow up with your Primary Care Provider.  ? ?________________________________________________________ ? ?The [facility] providers would like to encourage you to use [facility] to communicate with providers for non-urgent requests or questions.  Due to long hold times on the telephone, sending your provider a message by [facility] may be a faster and more efficient way to get a response.  Please allow 48 business hours for a response.  Please remember that this is for non-urgent requests.  ?_______________________________________________________ ? ?

## 2021-10-31 NOTE — Progress Notes (Signed)
I agree with the above note, plan 

## 2021-10-31 NOTE — Progress Notes (Signed)
? ?Chief Complaint: IDA and melena ? ?HPI: ?   [name] [name] is a 66 year old female with a past medical history as listed below including anxiety, CKD, COPD, CAD on Brilinta, depression and multiple others, known to Dr. Ardis Hughs, who was referred to me by Ladell Pier, MD for a complaint of iron deficiency anemia and melena. ?   02/19/2021 colonoscopy and EGD.  These were done for abdominal pain, change in bowel habits and rectal bleeding.  EGD with nonspecific pan gastritis and otherwise normal.  Pathology showed reactive gastropathy.  Colonoscopy with three 3-7 mm polyps in the sigmoid and transverse colon.  Diverticulosis in the left colon and normal terminal ileum.  Pathology showed tubular adenomas.  Repeat recommended in 7 years. ?   09/25/2021 patient seen in the ER for a flare of see OPD and placed on prednisone and doxycycline. ?   10/03/2021 CBC was normal.  Platelets elevated at 623.  Iron studies within normal iron, TIBC elevated at 553, percent saturation 13%.  Ferritin low normal at 13. ?   10/25/21 patient saw PCP and described noticing some dark stools once about 2 weeks prior.  Described taking Advil and sometimes ibuprofen 2 pills 4 times a day for pain in her shoulder.  She had been scheduled for 2 iron infusions 1 week apart starting 11/02/2021. ?   Today, the patient presents to clinic and tells me that she has had a couple of tar-like bowel movements.  The first occurred a couple of weeks ago one time and then this morning she had another black tar-like bowel movement.  Describes that she has been taking oftentimes 3-4 Ibuprofen 3 times a day for the past year almost for some shoulder pain.  Recently this was stopped by her PCP and she was started on Tramadol but she does not feel like this really helps the pain at all.  Denies any accompanying abdominal pain.  Does tell me she is scheduled for iron infusions 1 on 4/21 and the second on 4/28.  Currently taking Omeprazole 40 mg daily. ?   Denies  fever, chills, weight loss, heartburn or reflux. ?    ?Past Medical History:  ?Diagnosis Date  ? Allergy   ? seasonal  ? Anxiety   ? takes Lexapro daily  ? Arthritis   ? "back, from neck down pass my bra area" (03/25/2017)  ? Asthma   ? Bartholin gland cyst 08/29/2011  ? Bruises easily   ? pt is on Effient  ? Chronic back pain   ? herniated nucleus pulposus  ? Chronic back pain   ? "neck to bra area; lower back" (03/25/2017)  ? Chronic kidney disease   ? recurrent UTI's this year 2022  ? COPD (chronic obstructive pulmonary disease) ([place])   ? early stages  ? Coronary artery disease   ? Depression   ? takes Klonopin daily  ? Diabetes mellitus without complication ([place])   ? Diverticulosis   ? GERD (gastroesophageal reflux disease)   ? takes Nexium daily  ? H/O hiatal hernia   ? Heart attack [facility]) 2011  ? Hemorrhoids   ? Hernia   ? Hyperlipidemia   ? takes Lipitor daily  ? Hypertension   ? takes Losartan daily and Labetalol bid  ? Hypothyroidism   ? takes Synthroid daily  ? Insomnia   ? hydroxyzine prn  ? Joint pain   ? Pneumonia   ? "couple times" (03/25/2017)  ? Pre-diabetes   ? "just found  out 1 wk ago" (03/25/2017)  ? Psoriasis   ? elbows,knees,back  ? Shortness of breath   ? with exertion  ? Slowing of urinary stream   ? Stress incontinence   ? ? ?Past Surgical History:  ?Procedure Laterality Date  ? ABDOMINAL EXPLORATION SURGERY  1977  ? ABDOMINAL HYSTERECTOMY  1977  ? "left one of my ovaries"  ? BACK SURGERY    ? BIOPSY  05/27/2019  ? Procedure: BIOPSY;  Surgeon: Daneil Dolin, MD;  [facility];  Service: Endoscopy;;  ? COLONOSCOPY  2011  ? Dr. Ardis Hughs: Mild diverticulosis, descending diminutive colon polyp (not retrieved), next colonoscopy 10 years  ? CORONARY ANGIOPLASTY WITH STENT PLACEMENT  2011 X2  ? "regular stents didn't work; had to go back in in ~ 1 month and put in medicated stents"  ? DILATION AND CURETTAGE OF UTERUS    ? ESOPHAGOGASTRODUODENOSCOPY    ? ESOPHAGOGASTRODUODENOSCOPY (EGD) WITH  PROPOFOL N/A 05/27/2019  ? normal esophagus, dilation, erosive gastropathy s/p biopsy, normal duodenum. Negative H.pylori.   ? LEFT HEART CATH AND CORONARY ANGIOGRAPHY N/A 03/26/2017  ? Procedure: LEFT HEART CATH AND CORONARY ANGIOGRAPHY;  Surgeon: Belva Crome, MD;  Location: [place] CV LAB;  Service: Cardiovascular;  Laterality: N/A;  ? LUMBAR LAMINECTOMY/DECOMPRESSION MICRODISCECTOMY  08/05/2011  ? Procedure: LUMBAR LAMINECTOMY/DECOMPRESSION MICRODISCECTOMY;  Surgeon: Otilio Connors, MD;  Location: [place] NEURO ORS;  Service: Neurosurgery;  Laterality: Right;  Right Lumbar four-five extraforaminal discectomy  ? MALONEY DILATION N/A 05/27/2019  ? Procedure: MALONEY DILATION;  Surgeon: Daneil Dolin, MD;  [facility];  Service: Endoscopy;  Laterality: N/A;  54  ? TONSILLECTOMY    ? as a child  ? UPPER GASTROINTESTINAL ENDOSCOPY    ? ? ?Current Outpatient Medications  ?Medication Sig Dispense Refill  ? albuterol (VENTOLIN HFA) 108 (90 Base) MCG/ACT inhaler INHALE 2 PUFFS INTO THE LUNGS EVERY 4-6 HOURS AS NEEDED FOR WHEEZING OR SHORTNESS OF BREATH. 18 g 2  ? amLODipine (NORVASC) 5 MG tablet TAKE 1 TABLET (5 MG TOTAL) BY MOUTH DAILY. 90 tablet 1  ? aspirin EC 81 MG tablet Take 81 mg by mouth daily.    ? Blood Glucose Monitoring Suppl (TRUE METRIX METER) DEVI 1 kit by Does not apply route 4 (four) times daily. 1 Device 0  ? busPIRone (BUSPAR) 15 MG tablet TAKE 2 TABLETS IN THE MORNING, 1 TAB AT NOON AND 1 TAB IN THE EVENING (Patient taking differently: Take 15 mg by mouth 2 (two) times daily.) 120 tablet 2  ? clonazePAM (KLONOPIN) 0.5 MG tablet Take 1 tablet (0.5 mg total) by mouth 2 (two) times daily as needed for anxiety. 60 tablet 2  ? doxepin (SINEQUAN) 50 MG capsule TAKE 1 CAPSULE (50 MG TOTAL) BY MOUTH AT BEDTIME. 30 capsule 2  ? escitalopram (LEXAPRO) 10 MG tablet TAKE 3 TABLETS (30 MG TOTAL) BY MOUTH DAILY. (Patient taking differently: Take 10 mg by mouth in the morning and at bedtime.) 90  tablet 2  ? Fluticasone-Umeclidin-Vilant (TRELEGY ELLIPTA) 100-62.5-25 MCG/ACT AEPB Inhale 1 puff into the lungs daily 60 each 2  ? furosemide (LASIX) 20 MG tablet Take 1 tablet (20 mg total) by mouth daily. 30 tablet 2  ? glucose blood (TRUE METRIX BLOOD GLUCOSE TEST) test strip Use as instructed 100 each 12  ? ipratropium-albuterol (DUONEB) 0.5-2.5 (3) MG/3ML SOLN TAKE 3 MLS VIA NEBULIZATION EVERY 6 HOURS (Patient taking differently: Take 3 mLs by nebulization every 6 (six) hours  as needed (shortness of breath or wheezing).) 360 mL 1  ? levothyroxine (SYNTHROID) 112 MCG tablet Take 1 tablet (112 mcg total) by mouth daily. 30 tablet 6  ? losartan (COZAAR) 50 MG tablet TAKE 1 & 1/2 TABLETS BY MOUTH DAILY. 45 tablet 2  ? metFORMIN (GLUCOPHAGE) 500 MG tablet TAKE 1 TABLET (500 MG TOTAL) BY MOUTH 2 (TWO) TIMES DAILY WITH A MEAL. 60 tablet 3  ? methocarbamol (ROBAXIN) 500 MG tablet Take 1 tablet by mouth 4 times daily 42 tablet 1  ? methocarbamol (ROBAXIN) 500 MG tablet Take 1 tablet (500 mg total) by mouth 2 (two) times daily as needed for muscle spasms. 60 tablet 1  ? mirtazapine (REMERON) 30 MG tablet TAKE 1 TABLET (30 MG TOTAL) BY MOUTH AT BEDTIME. 30 tablet 2  ? montelukast (SINGULAIR) 10 MG tablet TAKE 1 TABLET BY MOUTH AT BEDTIME. 30 tablet 2  ? nebivolol (BYSTOLIC) 10 MG tablet TAKE 1 TABLET (10 MG TOTAL) BY MOUTH DAILY. 90 tablet 1  ? nicotine (NICODERM CQ - DOSED IN MG/24 HOURS) 21 mg/24hr patch Place 1 patch (21 mg total) onto the skin daily. 28 patch 1  ? nitroGLYCERIN (NITROSTAT) 0.4 MG SL tablet Place 1 tablet (0.4 mg total) under the tongue every 5 (five) minutes as needed for chest pain. 25 tablet 3  ? omeprazole (PRILOSEC) 40 MG capsule TAKE 1 CAPSULE (40 MG TOTAL) BY MOUTH 2 (TWO) TIMES DAILY BEFORE A MEAL. 60 capsule 2  ? sodium chloride (OCEAN) 0.65 % SOLN nasal spray Place 1 spray into both nostrils as needed for congestion.    ? ticagrelor (BRILINTA) 60 MG TABS tablet TAKE 1 TABLET (60 MG TOTAL) BY  MOUTH 2 (TWO) TIMES DAILY. 60 tablet 5  ? traMADol (ULTRAM) 50 MG tablet Take 1 tablet (50 mg total) by mouth every 12 (twelve) hours as needed. 60 tablet 1  ? TRUEPLUS LANCETS 28G MISC 28 g by Does not apply

## 2021-11-01 NOTE — Telephone Encounter (Signed)
Clearance request sent to patient's PCP via Epic and fax. ?

## 2021-11-02 ENCOUNTER — Other Ambulatory Visit: Payer: Self-pay | Admitting: Hematology

## 2021-11-02 ENCOUNTER — Other Ambulatory Visit: Payer: Self-pay

## 2021-11-02 ENCOUNTER — Inpatient Hospital Stay: Payer: Medicare Other

## 2021-11-02 VITALS — BP 120/56 | HR 62 | Temp 98.9°F | Resp 17

## 2021-11-02 DIAGNOSIS — D75839 Thrombocytosis, unspecified: Secondary | ICD-10-CM | POA: Diagnosis not present

## 2021-11-02 DIAGNOSIS — D509 Iron deficiency anemia, unspecified: Secondary | ICD-10-CM | POA: Insufficient documentation

## 2021-11-02 DIAGNOSIS — D508 Other iron deficiency anemias: Secondary | ICD-10-CM

## 2021-11-02 MED ORDER — SODIUM CHLORIDE 0.9 % IV SOLN
300.0000 mg | Freq: Once | INTRAVENOUS | Status: AC
  Administered 2021-11-02: 300 mg via INTRAVENOUS
  Filled 2021-11-02: qty 300

## 2021-11-02 MED ORDER — SODIUM CHLORIDE 0.9 % IV SOLN: Freq: Once | INTRAVENOUS | Status: AC

## 2021-11-02 MED ORDER — LORATADINE 10 MG PO TABS
10.0000 mg | ORAL_TABLET | Freq: Once | ORAL | Status: AC
  Administered 2021-11-02: 10 mg via ORAL
  Filled 2021-11-02: qty 1

## 2021-11-02 MED ORDER — ACETAMINOPHEN 325 MG PO TABS
650.0000 mg | ORAL_TABLET | Freq: Once | ORAL | Status: AC
  Administered 2021-11-02: 650 mg via ORAL
  Filled 2021-11-02: qty 2

## 2021-11-02 NOTE — Patient Instructions (Signed)

## 2021-11-06 ENCOUNTER — Other Ambulatory Visit: Payer: Self-pay

## 2021-11-07 ENCOUNTER — Other Ambulatory Visit: Payer: Self-pay

## 2021-11-07 ENCOUNTER — Encounter: Payer: Self-pay | Admitting: Hematology

## 2021-11-08 ENCOUNTER — Other Ambulatory Visit: Payer: Self-pay

## 2021-11-08 ENCOUNTER — Encounter: Payer: Self-pay | Admitting: Hematology

## 2021-11-09 ENCOUNTER — Other Ambulatory Visit: Payer: Self-pay

## 2021-11-09 ENCOUNTER — Telehealth: Payer: Self-pay | Admitting: Hematology

## 2021-11-09 ENCOUNTER — Inpatient Hospital Stay: Payer: Medicare Other

## 2021-11-09 NOTE — Telephone Encounter (Signed)
Scheduled appointment per inbasket message. Left message.   ?

## 2021-11-09 NOTE — Telephone Encounter (Signed)
Per Dr. Wynetta Emery patient may hold Brilinta for 5 days. Informed patient and patient voiced understanding.  ?

## 2021-11-12 HISTORY — PX: BREAST BIOPSY: SHX20

## 2021-11-13 ENCOUNTER — Encounter: Payer: Self-pay | Admitting: Hematology

## 2021-11-13 ENCOUNTER — Encounter: Payer: Self-pay | Admitting: Critical Care Medicine

## 2021-11-13 ENCOUNTER — Ambulatory Visit: Payer: Medicare Other | Attending: Critical Care Medicine | Admitting: Critical Care Medicine

## 2021-11-13 ENCOUNTER — Other Ambulatory Visit: Payer: Self-pay

## 2021-11-13 VITALS — BP 128/60 | HR 86 | Wt 142.2 lb

## 2021-11-13 DIAGNOSIS — Z01818 Encounter for other preprocedural examination: Secondary | ICD-10-CM | POA: Insufficient documentation

## 2021-11-13 DIAGNOSIS — I25119 Atherosclerotic heart disease of native coronary artery with unspecified angina pectoris: Secondary | ICD-10-CM

## 2021-11-13 DIAGNOSIS — Z8249 Family history of ischemic heart disease and other diseases of the circulatory system: Secondary | ICD-10-CM | POA: Diagnosis not present

## 2021-11-13 DIAGNOSIS — J449 Chronic obstructive pulmonary disease, unspecified: Secondary | ICD-10-CM | POA: Diagnosis not present

## 2021-11-13 DIAGNOSIS — K219 Gastro-esophageal reflux disease without esophagitis: Secondary | ICD-10-CM | POA: Insufficient documentation

## 2021-11-13 DIAGNOSIS — F17210 Nicotine dependence, cigarettes, uncomplicated: Secondary | ICD-10-CM | POA: Insufficient documentation

## 2021-11-13 DIAGNOSIS — J439 Emphysema, unspecified: Secondary | ICD-10-CM

## 2021-11-13 DIAGNOSIS — I2510 Atherosclerotic heart disease of native coronary artery without angina pectoris: Secondary | ICD-10-CM | POA: Diagnosis not present

## 2021-11-13 DIAGNOSIS — F17200 Nicotine dependence, unspecified, uncomplicated: Secondary | ICD-10-CM

## 2021-11-13 LAB — DRUG SCREEN 11 W/CONF, SE
Amphetamines, IA: NEGATIVE ng/mL
Barbiturates, IA: NEGATIVE ug/mL
Benzodiazepines, IA: NEGATIVE ng/mL
Cocaine & Metabolite, IA: NEGATIVE ng/mL
Ethyl Alcohol, Enz: NEGATIVE gm/dL
Methadone, IA: NEGATIVE ng/mL
Opiates, IA: NEGATIVE ng/mL
Oxycodones, IA: NEGATIVE ng/mL
Phencyclidine, IA: NEGATIVE ng/mL
Propoxyphene, IA: NEGATIVE ng/mL
THC(Marijuana) Metabolite, IA: POSITIVE ng/mL — AB

## 2021-11-13 LAB — THC,MS,WB/SP RFX
Cannabidiol: NEGATIVE ng/mL
Cannabinoid Confirmation: POSITIVE
Carboxy-THC: 10.6 ng/mL
Hydroxy-THC: NEGATIVE ng/mL
Tetrahydrocannabinol(THC): NEGATIVE ng/mL

## 2021-11-13 MED ORDER — NICOTINE POLACRILEX 4 MG MT LOZG
LOZENGE | OROMUCOSAL | 0 refills | Status: DC
Start: 2021-11-13 — End: 2022-03-11
  Filled 2021-11-13: qty 72, 24d supply, fill #0

## 2021-11-13 NOTE — Assessment & Plan Note (Addendum)
COPD Gold stage E with previous frequent exacerbations and hospitalizations stable now on Trelegy ? ?The patient is stable at this time for planned anesthesia provided she can stop smoking at least 2 weeks prior to surgery.  She is going to use nicotine patch 21 mg daily and superimposed nicotine lozenges 4 mg 3 times daily in an effort to stop smoking ?

## 2021-11-13 NOTE — Progress Notes (Signed)
? ?Established Patient Office Visit ? ?Subjective   ?Patient ID: [name] [name], female    DOB: 09/28/1955  Age: 66 y.o. MRN: [id number] ? ?Preop evaluation and clearance for shoulder surgery ? ?This patient is seen in return follow-up with COPD she is on Trelegy inhaler but is still smoking 1 pack a day of cigarettes.  She does have a nicotine patch but is not regularly using this.  Blood pressure on arrival is 128/60.  Patient needs to have right shoulder torn rotator cuff arthroscopy per Dr. Lorin Mercy.  Anesthesia wanted further medical clearance by the patient's specialist.  Patient has no real productive cough.  The biggest hurdle is she needs to stop smoking at least 1 to 2 weeks prior to anesthetic event. ? ?Shortness of Breath ?This is a chronic problem. The current episode started more than 1 year ago. The problem occurs daily (ADLs , walking 68f is short of breath). The problem has been gradually worsening. The average episode lasts 5 minutes. Associated symptoms include rhinorrhea, sputum production and wheezing. Pertinent negatives include no abdominal pain, chest pain, claudication, ear pain, fever, headaches, hemoptysis, leg swelling, neck pain, orthopnea, PND, rash, sore throat, syncope or vomiting. The symptoms are aggravated by any activity, emotional upset, smoke and pollens. Associated symptoms comments: Mucus is clear  ?GJerrye Bushyis better . Risk factors include smoking. She has tried beta agonist inhalers, oral steroids and steroid inhalers for the symptoms. The treatment provided moderate relief. Her past medical history is significant for asthma, CAD, chronic lung disease and COPD. There is no history of aspirin allergies, DVT, a heart failure, PE, pneumonia or a recent surgery.  ? ?Patient Active Problem List  ? Diagnosis Date Noted  ? Iron deficiency anemia 11/02/2021  ? Major depressive disorder, recurrent, in full remission with anxious distress ([place] 05/31/2021  ? Rotator cuff tendinitis, right  05/08/2021  ? 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  ? Chronic neck pain 02/26/2021  ? GAD (generalized anxiety disorder) 01/08/2021  ? Acute cystitis without hematuria 11/07/2020  ? Influenza vaccine needed 06/15/2020  ? Abdominal pain, epigastric 04/28/2019  ? Esophageal dysphagia 04/28/2019  ? Bipolar I disorder ([place] 09/17/2018  ? Insomnia 10/28/2017  ? Prediabetes 05/28/2017  ? QT prolongation 03/25/2017  ? Encounter for screening mammogram for breast cancer 09/11/2015  ? Psoriasis 06/22/2015  ? COPD (chronic obstructive pulmonary disease) (H[facility])GOLD E 05/18/2015  ? Anxiety and depression 07/10/2013  ? Essential hypertension, benign 06/07/2013  ? Dyslipidemia 06/07/2013  ? Asthma, chronic 06/07/2013  ? Emphysema lung ([place] 06/07/2013  ? Chronic back pain 06/07/2013  ? CAD S/P percutaneous coronary angioplasty 06/07/2013  ? GERD (gastroesophageal reflux disease) 06/07/2013  ? Breast lump on left side at 3 o'clock position 10/13/2012  ? S/P abdominal hysterectomy and right salpingo-oophorectomy 08/29/2011  ? TOBACCO ABUSE 07/17/2009  ? Chronic rhinitis 07/17/2009  ? Lung nodule < 6cm on CT 07/17/2009  ? ALLERGY, FOOD 07/17/2009  ? Hypothyroidism 12/22/2008  ? ?Past Medical History:  ?Diagnosis Date  ? Allergy   ? seasonal  ? Anxiety   ? takes Lexapro daily  ? Arthritis   ? "back, from neck down pass my bra area" (03/25/2017)  ? Asthma   ? Bartholin gland cyst 08/29/2011  ? Bruises easily   ? pt is on Effient  ? Chronic back pain   ? herniated nucleus pulposus  ? Chronic back pain   ? "neck to bra area; lower back" (03/25/2017)  ? Chronic kidney disease   ?  recurrent UTI's this year 2022  ? COPD (chronic obstructive pulmonary disease) ([place])   ? early stages  ? Coronary artery disease   ? Depression   ? takes Klonopin daily  ? Diabetes mellitus without complication ([place])   ? Diverticulosis   ? GERD (gastroesophageal reflux disease)   ? takes Nexium daily  ? H/O hiatal hernia   ? Heart attack [facility]) 2011  ?  Hemorrhoids   ? Hernia   ? Hyperlipidemia   ? takes Lipitor daily  ? Hypertension   ? takes Losartan daily and Labetalol bid  ? Hypothyroidism   ? takes Synthroid daily  ? Insomnia   ? hydroxyzine prn  ? Joint pain   ? Pneumonia   ? "couple times" (03/25/2017)  ? Pre-diabetes   ? "just found out 1 wk ago" (03/25/2017)  ? Psoriasis   ? elbows,knees,back  ? Shortness of breath   ? with exertion  ? Slowing of urinary stream   ? Stress incontinence   ? ?Past Surgical History:  ?Procedure Laterality Date  ? ABDOMINAL EXPLORATION SURGERY  1977  ? ABDOMINAL HYSTERECTOMY  1977  ? "left one of my ovaries"  ? BACK SURGERY    ? BIOPSY  05/27/2019  ? Procedure: BIOPSY;  Surgeon: Daneil Dolin, MD;  [facility];  Service: Endoscopy;;  ? COLONOSCOPY  2011  ? Dr. Ardis Hughs: Mild diverticulosis, descending diminutive colon polyp (not retrieved), next colonoscopy 10 years  ? CORONARY ANGIOPLASTY WITH STENT PLACEMENT  2011 X2  ? "regular stents didn't work; had to go back in in ~ 1 month and put in medicated stents"  ? DILATION AND CURETTAGE OF UTERUS    ? ESOPHAGOGASTRODUODENOSCOPY    ? ESOPHAGOGASTRODUODENOSCOPY (EGD) WITH PROPOFOL N/A 05/27/2019  ? normal esophagus, dilation, erosive gastropathy s/p biopsy, normal duodenum. Negative H.pylori.   ? LEFT HEART CATH AND CORONARY ANGIOGRAPHY N/A 03/26/2017  ? Procedure: LEFT HEART CATH AND CORONARY ANGIOGRAPHY;  Surgeon: Belva Crome, MD;  Location: [place] CV LAB;  Service: Cardiovascular;  Laterality: N/A;  ? LUMBAR LAMINECTOMY/DECOMPRESSION MICRODISCECTOMY  08/05/2011  ? Procedure: LUMBAR LAMINECTOMY/DECOMPRESSION MICRODISCECTOMY;  Surgeon: Otilio Connors, MD;  Location: [place] NEURO ORS;  Service: Neurosurgery;  Laterality: Right;  Right Lumbar four-five extraforaminal discectomy  ? MALONEY DILATION N/A 05/27/2019  ? Procedure: MALONEY DILATION;  Surgeon: Daneil Dolin, MD;  [facility];  Service: Endoscopy;  Laterality: N/A;  54  ? TONSILLECTOMY    ? as a  child  ? UPPER GASTROINTESTINAL ENDOSCOPY    ? ?Social History  ? ?Tobacco Use  ? Smoking status: Every Day  ?  Packs/day: 1.00  ?  Years: 50.00  ?  Pack years: 50.00  ?  Types: Cigarettes  ? Smokeless tobacco: Never  ?Vaping Use  ? Vaping Use: Some days  ? Substances: Nicotine  ?Substance Use Topics  ? Alcohol use: Yes  ?  Comment: once a week  ? Drug use: No  ?  Types: Cocaine  ?  Comment: has been years per pt  ? ?Social History  ? ?Socioeconomic History  ? Marital status: Divorced  ?  Spouse name: Not on file  ? Number of children: 1  ? Years of education: Not on file  ? Highest education level: Not on file  ?Occupational History  ? Not on file  ?Tobacco Use  ? Smoking status: Every Day  ?  Packs/day: 1.00  ?  Years: 50.00  ?  Pack  years: 50.00  ?  Types: Cigarettes  ? Smokeless tobacco: Never  ?Vaping Use  ? Vaping Use: Some days  ? Substances: Nicotine  ?Substance and Sexual Activity  ? Alcohol use: Yes  ?  Comment: once a week  ? Drug use: No  ?  Types: Cocaine  ?  Comment: has been years per pt  ? Sexual activity: Not Currently  ?  Birth control/protection: Surgical  ?Other Topics Concern  ? Not on file  ?Social History Narrative  ? Not on file  ? ?Social Determinants of Health  ? ?Financial Resource Strain: Not on file  ?Food Insecurity: Not on file  ?Transportation Needs: Not on file  ?Physical Activity: Not on file  ?Stress: Not on file  ?Social Connections: Not on file  ?Intimate Partner Violence: Not on file  ? ?Family Status  ?Relation Name Status  ? Mother  Alive  ? Father  Deceased  ? Brother  Alive  ? [place]  ? Mat Uncle  (Not Specified)  ? [place]  ? MGM  Deceased  ? MGF  Deceased  ? PGM  (Not Specified)  ? Daughter  (Not Specified)  ? Neg Hx  (Not Specified)  ? ?Family History  ?Problem Relation Age of Onset  ? Heart disease Mother   ? Hypertension Mother   ? Stroke Mother   ? Mental illness Mother   ? Heart disease Father   ? Hypertension Father   ? Diabetes Father   ? Colon  polyps Brother   ? Breast cancer Maternal Aunt   ? Throat cancer Maternal Uncle   ? Thyroid disease Paternal Aunt   ? Heart disease Maternal Grandfather   ? Cancer Paternal Grandmother   ?     mouth  ? Anesth

## 2021-11-13 NOTE — Patient Instructions (Signed)
Stay on nicotine patch and start nicotine lozenge one dissolved in mouth three times daily to stop smoking ? ?No medication changes ? ?STay on Trelegy ? ?You are cleared to have surgery on your shoulder ? ?Return Dr Joya Gaskins 4 months ?

## 2021-11-13 NOTE — Assessment & Plan Note (Signed)
? ? ??   Current smoking consumption amount: 1 pack a day ? ?? Dicsussion on advise to quit smoking and smoking impacts: Cardiovascular and lung impacts ? ?? Patient's willingness to quit: Wants to quit ? ?? Methods to quit smoking discussed: Nicotine replacement ? ?Medication management of smoking session drugs discussed: Double coverage with nicotine 21 mg patch and the use of nicotine lozenge 4 mg 3 times daily as needed for desire to smoke cigarettes ?? Resources provided:  AVS  ? ?? Setting quit date in the next 10 days ? ?? Follow-up arranged 4 months ? ? ?Time spent counseling the patient: 5 minutes ? ? ?

## 2021-11-14 ENCOUNTER — Ambulatory Visit: Payer: Medicare Other

## 2021-11-16 ENCOUNTER — Other Ambulatory Visit (INDEPENDENT_AMBULATORY_CARE_PROVIDER_SITE_OTHER): Payer: Medicare Other

## 2021-11-16 ENCOUNTER — Other Ambulatory Visit: Payer: Self-pay

## 2021-11-16 ENCOUNTER — Other Ambulatory Visit: Payer: Self-pay | Admitting: Internal Medicine

## 2021-11-16 DIAGNOSIS — K219 Gastro-esophageal reflux disease without esophagitis: Secondary | ICD-10-CM

## 2021-11-16 DIAGNOSIS — K921 Melena: Secondary | ICD-10-CM

## 2021-11-16 DIAGNOSIS — I10 Essential (primary) hypertension: Secondary | ICD-10-CM

## 2021-11-16 DIAGNOSIS — D509 Iron deficiency anemia, unspecified: Secondary | ICD-10-CM | POA: Diagnosis not present

## 2021-11-16 LAB — CBC WITH DIFFERENTIAL/PLATELET
Basophils Absolute: 0.2 10*3/uL — ABNORMAL HIGH (ref 0.0–0.1)
Basophils Relative: 2.8 % (ref 0.0–3.0)
Eosinophils Absolute: 0.2 10*3/uL (ref 0.0–0.7)
Eosinophils Relative: 2.8 % (ref 0.0–5.0)
HCT: 34.3 % — ABNORMAL LOW (ref 36.0–46.0)
Hemoglobin: 10.9 g/dL — ABNORMAL LOW (ref 12.0–15.0)
Lymphocytes Relative: 28.8 % (ref 12.0–46.0)
Lymphs Abs: 2.5 10*3/uL (ref 0.7–4.0)
MCHC: 31.8 g/dL (ref 30.0–36.0)
MCV: 86.1 fl (ref 78.0–100.0)
Monocytes Absolute: 0.7 10*3/uL (ref 0.1–1.0)
Monocytes Relative: 8 % (ref 3.0–12.0)
Neutro Abs: 4.9 10*3/uL (ref 1.4–7.7)
Neutrophils Relative %: 57.6 % (ref 43.0–77.0)
Platelets: 531 10*3/uL — ABNORMAL HIGH (ref 150.0–400.0)
RBC: 3.98 Mil/uL (ref 3.87–5.11)
RDW: 21.2 % — ABNORMAL HIGH (ref 11.5–15.5)
WBC: 8.6 10*3/uL (ref 4.0–10.5)

## 2021-11-16 NOTE — Telephone Encounter (Signed)
Requested medication (s) are due for refill today:   Yes ? ?Requested medication (s) are on the active medication list:   Yes ? ?Future visit scheduled:   Yes ? ? ?Last ordered: 07/26/2021 #30, 2 refills ? ?Returned because no refills remain.   Also missing a mag. Level per protocol.    ? ?Requested Prescriptions  ?Pending Prescriptions Disp Refills  ? furosemide (LASIX) 20 MG tablet 30 tablet 2  ?  Sig: Take 1 tablet (20 mg total) by mouth daily.  ?  ? Cardiovascular:  Diuretics - Loop Failed - 11/16/2021  2:06 PM  ?  ?  Failed - Mg Level in normal range and within 180 days  ?  No results found for: MG  ?  ?  ?  Passed - K in normal range and within 180 days  ?  Potassium  ?Date Value Ref Range Status  ?10/03/2021 4.0 3.5 - 5.1 mmol/L Final  ?  ?  ?  ?  Passed - Ca in normal range and within 180 days  ?  Calcium  ?Date Value Ref Range Status  ?10/03/2021 9.8 8.9 - 10.3 mg/dL Final  ?  ?  ?  ?  Passed - Na in normal range and within 180 days  ?  Sodium  ?Date Value Ref Range Status  ?10/03/2021 139 135 - 145 mmol/L Final  ?09/14/2021 141 134 - 144 mmol/L Final  ?  ?  ?  ?  Passed - Cr in normal range and within 180 days  ?  Creatinine  ?Date Value Ref Range Status  ?10/03/2021 0.78 0.44 - 1.00 mg/dL Final  ? ?Creat  ?Date Value Ref Range Status  ?04/16/2016 0.93 0.50 - 1.05 mg/dL Final  ?  Comment:  ?    ?For patients > or = 66 years of age: The upper reference limit for ?Creatinine is approximately 13% higher for people identified as ?African-American. ?  ?  ? ?Creatinine,U  ?Date Value Ref Range Status  ?01/11/2010 61.7 mg/dL Final  ?  Comment:  ?  See lab report for associated comment(s)  ?  ?  ?  ?  Passed - Cl in normal range and within 180 days  ?  Chloride  ?Date Value Ref Range Status  ?10/03/2021 105 98 - 111 mmol/L Final  ?  ?  ?  ?  Passed - Last BP in normal range  ?  BP Readings from Last 1 Encounters:  ?11/13/21 128/60  ?  ?  ?  ?  Passed - Valid encounter within last 6 months  ?  Recent Outpatient Visits    ? ?      ? 3 days ago Pulmonary emphysema, unspecified emphysema type ([place])  ? [place] Elsie Stain, MD  ? 3 weeks ago Preoperative evaluation to rule out surgical contraindication  ? [place] Ladell Pier, MD  ? 2 months ago Preoperative evaluation to rule out surgical contraindication  ? [place] Karle Plumber B, MD  ? 4 months ago COPD with chronic bronchitis ([place])  ? [place] Karle Plumber B, MD  ? 7 months ago Chronic low back pain, unspecified back pain laterality, unspecified whether sciatica present  ? [facility] Kerin Perna, NP  ? ?  ?  ?Future Appointments   ? ?        ? In  3 months Troy Sine, MD [facility]  ? In 4 months Elsie Stain, MD [place]  ? ?  ? ? ?  ?  ?  ? ?

## 2021-11-17 ENCOUNTER — Other Ambulatory Visit: Payer: Self-pay

## 2021-11-17 ENCOUNTER — Inpatient Hospital Stay: Payer: Medicare Other | Attending: Hematology

## 2021-11-17 VITALS — BP 136/79 | HR 65 | Temp 98.1°F | Resp 15

## 2021-11-17 DIAGNOSIS — D75839 Thrombocytosis, unspecified: Secondary | ICD-10-CM | POA: Diagnosis not present

## 2021-11-17 DIAGNOSIS — D509 Iron deficiency anemia, unspecified: Secondary | ICD-10-CM | POA: Insufficient documentation

## 2021-11-17 DIAGNOSIS — D508 Other iron deficiency anemias: Secondary | ICD-10-CM

## 2021-11-17 DIAGNOSIS — Z79899 Other long term (current) drug therapy: Secondary | ICD-10-CM | POA: Insufficient documentation

## 2021-11-17 MED ORDER — SODIUM CHLORIDE 0.9 % IV SOLN
300.0000 mg | Freq: Once | INTRAVENOUS | Status: AC
  Administered 2021-11-17: 300 mg via INTRAVENOUS
  Filled 2021-11-17: qty 300

## 2021-11-17 MED ORDER — SODIUM CHLORIDE 0.9 % IV SOLN: Freq: Once | INTRAVENOUS | Status: AC

## 2021-11-17 MED ORDER — LORATADINE 10 MG PO TABS
10.0000 mg | ORAL_TABLET | Freq: Once | ORAL | Status: AC
  Administered 2021-11-17: 10 mg via ORAL
  Filled 2021-11-17: qty 1

## 2021-11-17 MED ORDER — ACETAMINOPHEN 325 MG PO TABS
650.0000 mg | ORAL_TABLET | Freq: Once | ORAL | Status: AC
  Administered 2021-11-17: 650 mg via ORAL
  Filled 2021-11-17: qty 2

## 2021-11-17 NOTE — Patient Instructions (Signed)

## 2021-11-19 ENCOUNTER — Ambulatory Visit (AMBULATORY_SURGERY_CENTER): Payer: Medicare Other | Admitting: Gastroenterology

## 2021-11-19 ENCOUNTER — Ambulatory Visit
Admission: RE | Admit: 2021-11-19 | Discharge: 2021-11-19 | Disposition: A | Discharge status: Home / Self Care | Payer: Medicare Other | Source: Ambulatory Visit | Attending: Internal Medicine | Admitting: Internal Medicine

## 2021-11-19 ENCOUNTER — Other Ambulatory Visit: Payer: Self-pay | Admitting: Internal Medicine

## 2021-11-19 ENCOUNTER — Encounter: Payer: Self-pay | Admitting: Gastroenterology

## 2021-11-19 VITALS — BP 141/70 | HR 66 | Temp 98.2°F | Resp 14 | Ht 62.0 in | Wt 142.0 lb

## 2021-11-19 DIAGNOSIS — K2970 Gastritis, unspecified, without bleeding: Secondary | ICD-10-CM

## 2021-11-19 DIAGNOSIS — Z1231 Encounter for screening mammogram for malignant neoplasm of breast: Secondary | ICD-10-CM | POA: Diagnosis not present

## 2021-11-19 DIAGNOSIS — K449 Diaphragmatic hernia without obstruction or gangrene: Secondary | ICD-10-CM

## 2021-11-19 DIAGNOSIS — D509 Iron deficiency anemia, unspecified: Secondary | ICD-10-CM

## 2021-11-19 MED ORDER — METHOCARBAMOL 500 MG PO TABS
500.0000 mg | ORAL_TABLET | Freq: Two times a day (BID) | ORAL | 1 refills | Status: DC | PRN
  Filled 2021-11-19: qty 60, 30d supply, fill #0
  Filled 2022-01-17: qty 60, 30d supply, fill #1

## 2021-11-19 MED ORDER — SODIUM CHLORIDE 0.9 % IV SOLN: 500.0000 mL | Freq: Once | INTRAVENOUS | Status: DC

## 2021-11-19 NOTE — Progress Notes (Signed)
VS by DT  Pt's states no medical or surgical changes since previsit or office visit.  

## 2021-11-19 NOTE — Progress Notes (Signed)
?  The recent H&P (dated within past 30 days) was reviewed, the patient was examined and there is no change in the patients condition since that H&P was completed. ? ? ?[name] [name]  11/19/2021, 7:52 AM ? ?

## 2021-11-19 NOTE — Patient Instructions (Signed)
Handouts on hiatal hernia and gastritis given to patient.  ?Await pathology results - will consider capsule endoscopy pending the results of biopsies taken today ?Continue present medications - stay on twice a day ompeprazole and continue to avoid NSAID type of medications  ?OK to resume blood thinner today  ?Resume previous diet  ? ? ?YOU HAD AN ENDOSCOPIC PROCEDURE TODAY AT [facility]:   Refer to the procedure report that was given to you for any specific questions about what was found during the examination.  If the procedure report does not answer your questions, please call your gastroenterologist to clarify.  If you requested that your care partner not be given the details of your procedure findings, then the procedure report has been included in a sealed envelope for you to review at your convenience later. ? ?YOU SHOULD EXPECT: Some feelings of bloating in the abdomen. Passage of more gas than usual.  Walking can help get rid of the air that was put into your GI tract during the procedure and reduce the bloating. If you had a lower endoscopy (such as a colonoscopy or flexible sigmoidoscopy) you may notice spotting of blood in your stool or on the toilet paper. If you underwent a bowel prep for your procedure, you may not have a normal bowel movement for a few days. ? ?Please Note:  You might notice some irritation and congestion in your nose or some drainage.  This is from the oxygen used during your procedure.  There is no need for concern and it should clear up in a day or so. ? ?SYMPTOMS TO REPORT IMMEDIATELY: ? ?Following upper endoscopy (EGD) ? Vomiting of blood or coffee ground material ? New chest pain or pain under the shoulder blades ? Painful or persistently difficult swallowing ? New shortness of breath ? Fever of 100?F or higher ? Black, tarry-looking stools ? ?For urgent or emergent issues, a gastroenterologist can be reached at any hour by calling [phone]. ?Do not use  MyChart messaging for urgent concerns.  ? ? ?DIET:  We do recommend a small meal at first, but then you may proceed to your regular diet.  Drink plenty of fluids but you should avoid alcoholic beverages for 24 hours. ? ?ACTIVITY:  You should plan to take it easy for the rest of today and you should NOT DRIVE or use heavy machinery until tomorrow (because of the sedation medicines used during the test).   ? ?FOLLOW UP: ?Our staff will call the number listed on your records 48-72 hours following your procedure to check on you and address any questions or concerns that you may have regarding the information given to you following your procedure. If we do not reach you, we will leave a message.  We will attempt to reach you two times.  During this call, we will ask if you have developed any symptoms of COVID 19. If you develop any symptoms (ie: fever, flu-like symptoms, shortness of breath, cough etc.) before then, please call [phone].  If you test positive for Covid 19 in the 2 weeks post procedure, please call and report this information to [place].   ? ?If any biopsies were taken you will be contacted by phone or by letter within the next 1-3 weeks.  Please call us at [phone] if you have not heard about the biopsies in 3 weeks.  ? ? ?SIGNATURES/CONFIDENTIALITY: ?You and/or your care partner have signed paperwork which will be entered into your  electronic medical record.  These signatures attest to the fact that that the information above on your After Visit Summary has been reviewed and is understood.  Full responsibility of the confidentiality of this discharge information lies with you and/or your care-partner.  ?

## 2021-11-19 NOTE — Progress Notes (Signed)
Called to room to assist during endoscopic procedure.  Patient ID and intended procedure confirmed with present staff. Received instructions for my participation in the procedure from the performing physician.  

## 2021-11-19 NOTE — Progress Notes (Signed)
Report to pacu rn; vss. ?

## 2021-11-19 NOTE — Op Note (Signed)
[place] ?Patient Name: [name] [name] ?Procedure Date: 11/19/2021 8:19 AM ?MRN: [id number] ?Endoscopist: Milus Banister , MD ?Age: 66 ?Referring MD:  ?Date of Birth: Feb 11, 1956 ?Gender: Female ?Account #: [id number] ?Procedure:                Upper GI endoscopy ?Indications:              Iron deficiency anemia, intermittent dark stools,  ?                          on brilinta chronically ?Medicines:                Monitored Anesthesia Care ?Procedure:                Pre-Anesthesia Assessment: ?                          - Prior to the procedure, a History and Physical  ?                          was performed, and patient medications and  ?                          allergies were reviewed. The patient's tolerance of  ?                          previous anesthesia was also reviewed. The risks  ?                          and benefits of the procedure and the sedation  ?                          options and risks were discussed with the patient.  ?                          All questions were answered, and informed consent  ?                          was obtained. Prior Anticoagulants: The patient has  ?                          taken brilinta last dose was 5 days prior to  ?                          procedure. ASA Grade Assessment: III - A patient  ?                          with severe systemic disease. After reviewing the  ?                          risks and benefits, the patient was deemed in  ?                          satisfactory condition to undergo the procedure. ?  After obtaining informed consent, the endoscope was  ?                          passed under direct vision. Throughout the  ?                          procedure, the patient's blood pressure, pulse, and  ?                          oxygen saturations were monitored continuously. The  ?                          Endoscope was introduced through the mouth, and  ?                          advanced to the second part of  duodenum. The upper  ?                          GI endoscopy was accomplished without difficulty.  ?                          The patient tolerated the procedure well. ?Scope In: ?Scope Out: ?Findings:                 Erythematous mucosa was found in the gastric  ?                          antrum. Biopsies were taken with a cold forceps for  ?                          histology. ?                          A small hiatal hernia was present, without  ?                          Cameron's erosions.. ?                          Biopsies for histology were taken with a cold  ?                          forceps in the entire duodenum for evaluation of  ?                          celiac disease. ?                          The exam was otherwise without abnormality. ?Complications:            No immediate complications. Estimated blood loss:  ?                          None. ?Estimated Blood Loss:     Estimated blood loss: none. ?Impression:               -  Erythematous mucosa in the antrum. Biopsied. ?                          - Small hiatal hernia without Cameron's erosions. ?                          - The examination was otherwise normal. ?                          - Biopsies were taken with a cold forceps for  ?                          evaluation of celiac disease. ?Recommendation:           - Patient has a contact number available for  ?                          emergencies. The signs and symptoms of potential  ?                          delayed complications were discussed with the  ?                          patient. Return to normal activities tomorrow.  ?                          Written discharge instructions were provided to the  ?                          patient. ?                          - Resume previous diet. ?                          - Continue present medications. Stay on twice a day  ?                          ompeprazole. Continue to avoid NSAID type pain  ?                          medicines. ?                           - Await pathology results. Will consider capsule  ?                          endoscopy pending the results of these biopsies. ?                          - Ok to resume your blood thinner today. ?Milus Banister, MD ?11/19/2021 8:35:42 AM ?This report has been signed electronically. ?

## 2021-11-20 ENCOUNTER — Other Ambulatory Visit: Payer: Self-pay

## 2021-11-20 ENCOUNTER — Ambulatory Visit: Payer: Medicare Other

## 2021-11-20 ENCOUNTER — Encounter: Payer: Self-pay | Admitting: Hematology

## 2021-11-21 ENCOUNTER — Other Ambulatory Visit: Payer: Self-pay

## 2021-11-21 ENCOUNTER — Encounter: Payer: Self-pay | Admitting: Orthopaedic Surgery

## 2021-11-21 ENCOUNTER — Other Ambulatory Visit: Payer: Self-pay | Admitting: Internal Medicine

## 2021-11-21 ENCOUNTER — Telehealth: Payer: Self-pay | Admitting: Internal Medicine

## 2021-11-21 ENCOUNTER — Encounter: Payer: Self-pay | Admitting: Hematology

## 2021-11-21 ENCOUNTER — Telehealth: Payer: Self-pay | Admitting: *Deleted

## 2021-11-21 DIAGNOSIS — R928 Other abnormal and inconclusive findings on diagnostic imaging of breast: Secondary | ICD-10-CM

## 2021-11-21 MED ORDER — GABAPENTIN 600 MG PO TABS
ORAL_TABLET | Freq: Four times a day (QID) | ORAL | 1 refills | Status: DC
Start: 2021-11-21 — End: 2022-12-20
  Filled 2021-11-21: qty 120, 30d supply, fill #0
  Filled 2022-02-07: qty 120, 30d supply, fill #1
  Filled 2022-06-13: qty 120, 30d supply, fill #2
  Filled 2022-08-12: qty 120, 30d supply, fill #3

## 2021-11-21 NOTE — Telephone Encounter (Signed)
Phone call placed to patient today.  Advised patient that I have reviewed the note from Dr. Joya Gaskins and both he and cardiology have cleared her for the surgery on her shoulder.  I have also reviewed the GI note and recent EGD report.  I did send Dr. Irene Limbo a message after I last saw her but I have not received a reply as yet.  I will try reaching out to him again today.  Patient tells me that she had her last iron infusion last week and there is no more planned at this time.  However I see that she had a recent CBC done by the gastroenterologist and that showed a decrease in hemoglobin from 12.6 to 10.9.   ?

## 2021-11-21 NOTE — Telephone Encounter (Signed)
First attempt, left VM.  

## 2021-11-22 ENCOUNTER — Other Ambulatory Visit: Payer: Self-pay

## 2021-11-22 DIAGNOSIS — D509 Iron deficiency anemia, unspecified: Secondary | ICD-10-CM

## 2021-11-22 DIAGNOSIS — R195 Other fecal abnormalities: Secondary | ICD-10-CM

## 2021-11-26 ENCOUNTER — Telehealth: Payer: Self-pay | Admitting: Internal Medicine

## 2021-11-26 ENCOUNTER — Other Ambulatory Visit: Payer: Self-pay

## 2021-11-26 ENCOUNTER — Other Ambulatory Visit: Payer: Self-pay | Admitting: Internal Medicine

## 2021-11-26 ENCOUNTER — Encounter: Payer: Self-pay | Admitting: Hematology

## 2021-11-26 ENCOUNTER — Ambulatory Visit
Admission: RE | Admit: 2021-11-26 | Discharge: 2021-11-26 | Disposition: A | Discharge status: Home / Self Care | Payer: Medicare Other | Source: Ambulatory Visit | Attending: Internal Medicine | Admitting: Internal Medicine

## 2021-11-26 DIAGNOSIS — R921 Mammographic calcification found on diagnostic imaging of breast: Secondary | ICD-10-CM

## 2021-11-26 DIAGNOSIS — R928 Other abnormal and inconclusive findings on diagnostic imaging of breast: Secondary | ICD-10-CM

## 2021-11-26 DIAGNOSIS — E611 Iron deficiency: Secondary | ICD-10-CM

## 2021-11-26 MED ORDER — FERROUS SULFATE 325 (65 FE) MG PO TABS
325.0000 mg | ORAL_TABLET | Freq: Every day | ORAL | 1 refills | Status: DC
  Filled 2021-11-26: qty 90, 90d supply, fill #0
  Filled 2022-02-20: qty 30, 30d supply, fill #1

## 2021-11-26 NOTE — Telephone Encounter (Signed)
Pt is calling to ask where is her imaging appt today? Please advise  ?

## 2021-11-26 NOTE — Telephone Encounter (Signed)
Patient was able to find message in MyChart regarding [facility]. While on the phone with her, she  asked how long she should take the iron so she can get cleared for surgery.  ?  ?

## 2021-11-26 NOTE — Telephone Encounter (Signed)
Phone call placed to patient this morning.  Inform patient that I have not heard back from the hematologist Dr. Irene Limbo.   ?I recommend that she starts iron supplement.  Rxn sent to her pharmacy.  Looks like she has capsule endoscopy scheduled for today. ?

## 2021-11-26 NOTE — Telephone Encounter (Signed)
Patient aware of office hours and that orders for lab test are in.  ?

## 2021-11-27 ENCOUNTER — Encounter: Payer: Self-pay | Admitting: Hematology

## 2021-11-27 ENCOUNTER — Ambulatory Visit
Admission: RE | Admit: 2021-11-27 | Discharge: 2021-11-27 | Disposition: A | Discharge status: Home / Self Care | Payer: Medicare Other | Source: Ambulatory Visit | Attending: Internal Medicine | Admitting: Internal Medicine

## 2021-11-27 ENCOUNTER — Other Ambulatory Visit: Payer: Self-pay

## 2021-11-27 DIAGNOSIS — N6320 Unspecified lump in the left breast, unspecified quadrant: Secondary | ICD-10-CM | POA: Diagnosis not present

## 2021-11-27 DIAGNOSIS — R921 Mammographic calcification found on diagnostic imaging of breast: Secondary | ICD-10-CM

## 2021-11-27 MED ORDER — FUROSEMIDE 20 MG PO TABS
20.0000 mg | ORAL_TABLET | Freq: Every day | ORAL | 2 refills | Status: DC
  Filled 2021-11-27: qty 90, 90d supply, fill #0

## 2021-11-27 NOTE — Telephone Encounter (Signed)
Please fill if appropriate. Last provided in January for 30 ay supply ?

## 2021-11-28 ENCOUNTER — Other Ambulatory Visit: Payer: Self-pay

## 2021-11-28 ENCOUNTER — Other Ambulatory Visit (INDEPENDENT_AMBULATORY_CARE_PROVIDER_SITE_OTHER): Payer: Medicare Other

## 2021-11-28 ENCOUNTER — Ambulatory Visit (INDEPENDENT_AMBULATORY_CARE_PROVIDER_SITE_OTHER): Payer: Medicare Other | Admitting: Gastroenterology

## 2021-11-28 ENCOUNTER — Encounter: Payer: Self-pay | Admitting: Gastroenterology

## 2021-11-28 DIAGNOSIS — D509 Iron deficiency anemia, unspecified: Secondary | ICD-10-CM

## 2021-11-28 DIAGNOSIS — D500 Iron deficiency anemia secondary to blood loss (chronic): Secondary | ICD-10-CM | POA: Diagnosis not present

## 2021-11-28 LAB — CBC WITH DIFFERENTIAL/PLATELET
Basophils Absolute: 0.1 10*3/uL (ref 0.0–0.1)
Basophils Relative: 1.4 % (ref 0.0–3.0)
Eosinophils Absolute: 0.1 10*3/uL (ref 0.0–0.7)
Eosinophils Relative: 2 % (ref 0.0–5.0)
HCT: 38.3 % (ref 36.0–46.0)
Hemoglobin: 12.5 g/dL (ref 12.0–15.0)
Lymphocytes Relative: 27 % (ref 12.0–46.0)
Lymphs Abs: 1.9 10*3/uL (ref 0.7–4.0)
MCHC: 32.5 g/dL (ref 30.0–36.0)
MCV: 87.3 fl (ref 78.0–100.0)
Monocytes Absolute: 0.7 10*3/uL (ref 0.1–1.0)
Monocytes Relative: 10.4 % (ref 3.0–12.0)
Neutro Abs: 4.2 10*3/uL (ref 1.4–7.7)
Neutrophils Relative %: 59.2 % (ref 43.0–77.0)
Platelets: 429 10*3/uL — ABNORMAL HIGH (ref 150.0–400.0)
RBC: 4.39 Mil/uL (ref 3.87–5.11)
RDW: 22.9 % — ABNORMAL HIGH (ref 11.5–15.5)
WBC: 7.1 10*3/uL (ref 4.0–10.5)

## 2021-11-28 NOTE — Progress Notes (Signed)
CAPSULE ID: [id number] Exp: 2023-04-06 LOT: [id number]  Patient arrived for capsule endoscopy accompanied by her daughter. Reported the prep went well. Confirmed patient is fasting. Explained dietary restrictions for the next few hours. Patient verbalized understanding. Opened capsule, ensured capsule was flashing and transmitting to the recorder prior to the patient swallowing the capsule. Patient swallowed capsule without difficulty. Patient told to call the office with any questions. Understands to return to the office today between 4:00 and 4:30 pm. No further questions by the conclusion of the visit.

## 2021-11-28 NOTE — Progress Notes (Signed)
Patient returns to have the recording equipment removed. She reports she has had 2 stools today. Describes both of them as black. Spoke with Darrell Jewel, PA. Patient will go to the lab and have a CBC drawn today. Patient will let me know if she does not see the capsule pass in her stools in the next 5 days.

## 2021-11-28 NOTE — Patient Instructions (Signed)
?  The capsule endoscopy procedure will last approximately 8 hours. Contact your doctor?s ?office immediately if you suffer from any abdominal pain, nausea or vomiting during the ?procedure. ?1. You may drink colorless liquids starting 2 hours after ?swallowing the capsule. ? ?2. You may have a light snack  4 hours after ingestion. After the ?examination is completed you may return to your normal diet. ? ?3. Check the blue flashing DataRecorder light a couple of times through the day. If is stops blinking or changes color, note the time and contact your ?doctor. ? ?4. Avoid strong electromagnetic fields such as MRI devices or ham radios after swallowing ?the capsule and until you pass it in a bowel movement. ? ?5. Do not disconnect the equipment or completely remove the DataRecorder at any time ?during the procedure. ? ?6. Treat the DataRecorder carefully. Avoid sudden movements and banging the ?DataRecorder. ? ?7. Avoid direct exposure to bright sunlight. ? ?8. Return to the office between 4 and 4:30 pm to have the recording equipment removed. ? ?Clear Liquid Diet Examples: ?Black Coffee (non-dairy creamer ok) Jell-O (NO fruit added & NO red Jell-o) ?Water Bouillon (Chicken or Beef) ?7-up Cranberry Juice ?Apple Juice Popsicles (NO red) ?Tea Coke ?Sprite Pepsi ?Ginger Ale Gatorade ?Mt Dew Dr Malachi Bonds ?Light Snack Examples: ?Soup Cereal ?1/2 Sandwich Salad ?Eggs Potatoes ?Toast Rice  ?

## 2021-11-29 ENCOUNTER — Encounter: Payer: Self-pay | Admitting: Internal Medicine

## 2021-11-29 ENCOUNTER — Other Ambulatory Visit: Payer: Self-pay

## 2021-11-29 ENCOUNTER — Encounter: Payer: Self-pay | Admitting: Hematology

## 2021-11-29 ENCOUNTER — Encounter (HOSPITAL_COMMUNITY): Payer: Self-pay | Admitting: Physician Assistant

## 2021-11-29 ENCOUNTER — Telehealth (INDEPENDENT_AMBULATORY_CARE_PROVIDER_SITE_OTHER): Payer: Medicare Other | Admitting: Physician Assistant

## 2021-11-29 DIAGNOSIS — F3342 Major depressive disorder, recurrent, in full remission: Secondary | ICD-10-CM | POA: Diagnosis not present

## 2021-11-29 DIAGNOSIS — F411 Generalized anxiety disorder: Secondary | ICD-10-CM

## 2021-11-29 DIAGNOSIS — G4700 Insomnia, unspecified: Secondary | ICD-10-CM

## 2021-11-29 MED ORDER — DOXEPIN HCL 75 MG PO CAPS
75.0000 mg | ORAL_CAPSULE | Freq: Every day | ORAL | 3 refills | Status: DC
  Filled 2021-11-29: qty 30, 30d supply, fill #0
  Filled 2022-02-07: qty 30, 30d supply, fill #1

## 2021-11-29 MED ORDER — ESCITALOPRAM OXALATE 10 MG PO TABS
ORAL_TABLET | ORAL | 3 refills | Status: DC
  Filled 2021-11-29: qty 90, 30d supply, fill #0
  Filled 2021-11-29: qty 90, 90d supply, fill #0

## 2021-11-29 MED ORDER — BUSPIRONE HCL 15 MG PO TABS
ORAL_TABLET | ORAL | 3 refills | Status: DC
  Filled 2021-11-29 (×2): qty 120, 30d supply, fill #0
  Filled 2022-01-22: qty 120, 30d supply, fill #1
  Filled 2022-02-20: qty 120, 30d supply, fill #2

## 2021-11-29 MED ORDER — MIRTAZAPINE 30 MG PO TABS
ORAL_TABLET | ORAL | 3 refills | Status: DC
  Filled 2021-11-29: qty 30, fill #0
  Filled 2021-12-12: qty 30, 30d supply, fill #0
  Filled 2022-01-22: qty 30, 30d supply, fill #1
  Filled 2022-02-20: qty 30, 30d supply, fill #2

## 2021-11-29 MED ORDER — CLONAZEPAM 0.5 MG PO TABS: 0.5000 mg | ORAL_TABLET | Freq: Two times a day (BID) | ORAL | 2 refills | Status: DC | PRN

## 2021-11-29 NOTE — Progress Notes (Addendum)
[place] MD/PA/NP OP Progress Note  Virtual Visit via Telephone Note  I connected with [name] [name] on 11/29/21 at 11:00 AM EDT by telephone and verified that I am speaking with the correct person using two identifiers.  Location: Patient: Home Provider: Clinic   I discussed the limitations, risks, security and privacy concerns of performing an evaluation and management service by telephone and the availability of in person appointments. I also discussed with the patient that there may be a patient responsible charge related to this service. The patient expressed understanding and agreed to proceed.  Follow Up Instructions:   I discussed the assessment and treatment plan with the patient. The patient was provided an opportunity to ask questions and all were answered. The patient agreed with the plan and demonstrated an understanding of the instructions.   The patient was advised to call back or seek an in-person evaluation if the symptoms worsen or if the condition fails to improve as anticipated.  I provided 19 minutes of non-face-to-face time during this encounter.  Malachy Mood, PA   11/29/2021 8:21 PM [name] [name]  MRN:  [id number]  Chief Complaint:  Chief Complaint  Patient presents with   Follow-up   Medication Management   HPI:   [name] [name] is a 66 year old female with a past psychiatric history significant for insomnia, generalized anxiety disorder, and major depressive disorder who presents to [facility] via virtual telephone visit for follow-up and medication management.  Patient is currently being managed on the following medications:  Doxepin (Sinequan) 50 mg at bedtime Buspirone 15 mg 3 times daily in the morning/1 tablet at noon/1 tablet in the evening  Escitalopram 30 mg daily Clonazepam 0.5 mg 2 times daily as needed Mirtazapine 30 mg at bedtime  Patient reports that she finds herself having to take a Klonopin to  help with her sleep.  He denies depression but states that a lot of things have been going on in her life such as her health issues.  Patient reports that she has successfully made it through the bulk of her stressors.  Patient continues to endorse anxiety and states that her anxiety occasionally reaches up to 10.  Whenever her anxiety is elevated, patient usually relies on her clonazepam.  Patient's current stressors include health issues related to blood loss from an undetermined location in her body.  Patient denies any other concerns at this time.  Patient endorses good support in the form of friends.  A GAD-7 screen was performed with the patient scoring a 7.  Patient is alert and oriented x4, calm, cooperative, and fully engaged in conversation during the encounter.  Patient endorses good mood.  Patient denies suicidal or homicidal ideations.  She further denies auditory or visual hallucinations and does not appear to be responding to internal/external stimuli.  Patient endorses mostly good sleep and receives on average 10 hours of sleep each night.  Patient endorses good appetite and eats on average 2 meals per day.  Patient endorses alcohol consumption on occasion.  He reports that she quit taking cigarettes and has been using vapes.  Patient denies illicit drug use.   Visit Diagnosis:    ICD-10-CM   1. Major depressive disorder, recurrent, in full remission with anxious distress (HCC)  F33.42 busPIRone (BUSPAR) 15 MG tablet    doxepin (SINEQUAN) 75 MG capsule    escitalopram (LEXAPRO) 10 MG tablet    2. GAD (generalized anxiety disorder)  F41.1 busPIRone (BUSPAR)  15 MG tablet    escitalopram (LEXAPRO) 10 MG tablet    3. Insomnia, unspecified type  G47.00 mirtazapine (REMERON) 30 MG tablet      Past Psychiatric History:  Insomnia Major depressive disorder Generalized anxiety disorder  Past Medical History:  Past Medical History:  Diagnosis Date   Allergy    seasonal   Anemia     Anxiety    takes Lexapro daily   Arthritis    "back, from neck down pass my bra area" (03/25/2017)   Asthma    Bartholin gland cyst 08/29/2011   Bruises easily    pt is on Effient   Chronic back pain    herniated nucleus pulposus   Chronic back pain    "neck to bra area; lower back" (03/25/2017)   Chronic kidney disease    recurrent UTI's this year 2022   COPD (chronic obstructive pulmonary disease) (HCC)    early stages   Coronary artery disease    Depression    takes Klonopin daily   Diabetes mellitus without complication (HCC)    Diverticulosis    GERD (gastroesophageal reflux disease)    takes Nexium daily   H/O hiatal hernia    Heart attack ([place]) 2011   Hemorrhoids    Hernia    Hyperlipidemia    takes Lipitor daily   Hypertension    takes Losartan daily and Labetalol bid   Hypothyroidism    takes Synthroid daily   Insomnia    hydroxyzine prn   Joint pain    Pneumonia    "couple times" (03/25/2017)   Pre-diabetes    "just found out 1 wk ago" (03/25/2017)   Psoriasis    elbows,knees,back   Shortness of breath    with exertion   Slowing of urinary stream    Stress incontinence     Past Surgical History:  Procedure Laterality Date   [place]   "left one of my ovaries"   BACK SURGERY     BIOPSY  05/27/2019   Procedure: BIOPSY;  Surgeon: Daneil Dolin, MD;  [facility];  Service: Endoscopy;;   COLONOSCOPY  2011   Dr. Ardis Hughs: Mild diverticulosis, descending diminutive colon polyp (not retrieved), next colonoscopy 10 years   CORONARY ANGIOPLASTY WITH STENT PLACEMENT  2011 X2   "regular stents didn't work; had to go back in in ~ 1 month and put in medicated stents"   DILATION AND CURETTAGE OF UTERUS     ESOPHAGOGASTRODUODENOSCOPY     ESOPHAGOGASTRODUODENOSCOPY (EGD) WITH PROPOFOL N/A 05/27/2019   normal esophagus, dilation, erosive gastropathy s/p biopsy, normal duodenum. Negative H.pylori.     LEFT HEART CATH AND CORONARY ANGIOGRAPHY N/A 03/26/2017   Procedure: LEFT HEART CATH AND CORONARY ANGIOGRAPHY;  Surgeon: Belva Crome, MD;  Location: [facility];  Service: Cardiovascular;  Laterality: N/A;   LUMBAR LAMINECTOMY/DECOMPRESSION MICRODISCECTOMY  08/05/2011   Procedure: LUMBAR LAMINECTOMY/DECOMPRESSION MICRODISCECTOMY;  Surgeon: Otilio Connors, MD;  Location: [facility];  Service: Neurosurgery;  Laterality: Right;  Right Lumbar four-five extraforaminal discectomy   MALONEY DILATION N/A 05/27/2019   Procedure: Venia Minks DILATION;  Surgeon: Daneil Dolin, MD;  [facility]: [facility];  Service: Endoscopy;  Laterality: N/A;  78   TONSILLECTOMY     as a child   UPPER GASTROINTESTINAL ENDOSCOPY      Family Psychiatric History:  See intake H&P for full details. Reviewed, with no updates at this  time.  Family History:  Family History  Problem Relation Age of Onset   Heart disease Mother    Hypertension Mother    Stroke Mother    Mental illness Mother    Heart disease Father    Hypertension Father    Diabetes Father    Colon polyps Brother    Breast cancer Maternal Aunt    Throat cancer Maternal Uncle    Thyroid disease Paternal Aunt    Heart disease Maternal Grandfather    Cancer Paternal Grandmother        mouth   Anesthesia problems Daughter    Hypotension Neg Hx    Malignant hyperthermia Neg Hx    Pseudochol deficiency Neg Hx    Colon cancer Neg Hx    Stomach cancer Neg Hx    Esophageal cancer Neg Hx    Pancreatic cancer Neg Hx    Rectal cancer Neg Hx     Social History:  Social History   Socioeconomic History   Marital status: Divorced    Spouse name: Not on file   Number of children: 1   Years of education: Not on file   Highest education level: Not on file  Occupational History   Not on file  Tobacco Use   Smoking status: Every Day    Packs/day: 1.00    Years: 50.00    Pack years: 50.00    Types: Cigarettes   Smokeless tobacco: Never   Vaping Use   Vaping Use: Some days   Substances: Nicotine  Substance and Sexual Activity   Alcohol use: Yes    Comment: once a week   Drug use: No    Types: Cocaine    Comment: has been years per pt   Sexual activity: Not Currently    Birth control/protection: Surgical  Other Topics Concern   Not on file  Social History Narrative   Not on file   Social Determinants of Health   Financial Resource Strain: Not on file  Food Insecurity: Not on file  Transportation Needs: Not on file  Physical Activity: Not on file  Stress: Not on file  Social Connections: Not on file    Allergies:  Allergies  Allergen Reactions   Avocado Anaphylaxis   Latex Shortness Of Breath and Rash   Codeine Nausea Only    Metabolic Disorder Labs: Lab Results  Component Value Date   HGBA1C 5.8 (H) 10/05/2021   MPG 119.76 10/05/2021   No results found for: PROLACTIN Lab Results  Component Value Date   CHOL 147 11/24/2020   TRIG 109 11/24/2020   HDL 58 11/24/2020   CHOLHDL 2.5 11/24/2020   VLDL 15 04/16/2016   LDLCALC 69 11/24/2020   LDLCALC 118 (H) 06/15/2020   Lab Results  Component Value Date   TSH 0.502 10/25/2021   TSH 5.450 (H) 09/14/2021    Therapeutic Level Labs: No results found for: LITHIUM No results found for: VALPROATE No components found for:  CBMZ  Current Medications: Current Outpatient Medications  Medication Sig Dispense Refill   albuterol (VENTOLIN HFA) 108 (90 Base) MCG/ACT inhaler INHALE 2 PUFFS INTO THE LUNGS EVERY 4-6 HOURS AS NEEDED FOR WHEEZING OR SHORTNESS OF BREATH. 18 g 2   amLODipine (NORVASC) 5 MG tablet TAKE 1 TABLET (5 MG TOTAL) BY MOUTH DAILY. 90 tablet 1   aspirin EC 81 MG tablet Take 81 mg by mouth daily.     atorvastatin (LIPITOR) 80 MG tablet Take 1 tablet (80 mg total) by  mouth daily. please schedule appointment for more refills 30 tablet 0   Blood Glucose Monitoring Suppl (TRUE METRIX METER) DEVI 1 kit by Does not apply route 4 (four) times  daily. 1 Device 0   busPIRone (BUSPAR) 15 MG tablet TAKE 2 TABLETS IN THE MORNING, 1 TAB AT NOON AND 1 TAB IN THE EVENING 120 tablet 3   clonazePAM (KLONOPIN) 0.5 MG tablet Take 1 tablet (0.5 mg total) by mouth 2 (two) times daily as needed for anxiety. 60 tablet 2   doxepin (SINEQUAN) 75 MG capsule Take 1 capsule (75 mg total) by mouth at bedtime. 30 capsule 3   escitalopram (LEXAPRO) 10 MG tablet TAKE 3 TABLETS (30 MG TOTAL) BY MOUTH DAILY. 90 tablet 3   ferrous sulfate 325 (65 FE) MG tablet Take 1 tablet (325 mg total) by mouth daily with breakfast. 100 tablet 1   Fluticasone-Umeclidin-Vilant (TRELEGY ELLIPTA) 100-62.5-25 MCG/ACT AEPB Inhale 1 puff into the lungs daily 60 each 2   furosemide (LASIX) 20 MG tablet Take 1 tablet (20 mg total) by mouth daily. 30 tablet 2   gabapentin (NEURONTIN) 600 MG tablet TAKE 1 TABLET BY MOUTH 4 TIMES DAILY 360 tablet 1   glucose blood (TRUE METRIX BLOOD GLUCOSE TEST) test strip Use as instructed 100 each 12   ipratropium-albuterol (DUONEB) 0.5-2.5 (3) MG/3ML SOLN TAKE 3 MLS VIA NEBULIZATION EVERY 6 HOURS (Patient taking differently: Take 3 mLs by nebulization every 6 (six) hours as needed (shortness of breath or wheezing).) 360 mL 1   levothyroxine (SYNTHROID) 112 MCG tablet Take 1 tablet (112 mcg total) by mouth daily. 30 tablet 6   losartan (COZAAR) 50 MG tablet TAKE 1 & 1/2 TABLETS BY MOUTH DAILY. 45 tablet 2   metFORMIN (GLUCOPHAGE) 500 MG tablet TAKE 1 TABLET (500 MG TOTAL) BY MOUTH 2 (TWO) TIMES DAILY WITH A MEAL. 60 tablet 3   methocarbamol (ROBAXIN) 500 MG tablet Take 1 tablet (500 mg total) by mouth 2 (two) times daily as needed for muscle spasms. 60 tablet 1   mirtazapine (REMERON) 30 MG tablet TAKE 1 TABLET (30 MG TOTAL) BY MOUTH AT BEDTIME. 30 tablet 3   montelukast (SINGULAIR) 10 MG tablet TAKE 1 TABLET BY MOUTH AT BEDTIME. 30 tablet 2   nebivolol (BYSTOLIC) 10 MG tablet TAKE 1 TABLET (10 MG TOTAL) BY MOUTH DAILY. 90 tablet 1   nicotine (NICODERM  CQ - DOSED IN MG/24 HOURS) 21 mg/24hr patch Place 1 patch (21 mg total) onto the skin daily. 28 patch 1   nicotine polacrilex (NICOTINE MINI) 4 MG lozenge Use three times a day as needed to stop smoking 100 tablet 0   nitroGLYCERIN (NITROSTAT) 0.4 MG SL tablet Place 1 tablet (0.4 mg total) under the tongue every 5 (five) minutes as needed for chest pain. (Patient not taking: Reported on 11/19/2021) 25 tablet 3   omeprazole (PRILOSEC) 40 MG capsule TAKE 1 CAPSULE (40 MG TOTAL) BY MOUTH 2 (TWO) TIMES DAILY BEFORE A MEAL. 60 capsule 2   sodium chloride (OCEAN) 0.65 % SOLN nasal spray Place 1 spray into both nostrils as needed for congestion.     ticagrelor (BRILINTA) 60 MG TABS tablet TAKE 1 TABLET (60 MG TOTAL) BY MOUTH 2 (TWO) TIMES DAILY. 60 tablet 5   traMADol (ULTRAM) 50 MG tablet Take 1 tablet (50 mg total) by mouth every 12 (twelve) hours as needed. 60 tablet 1   TRUEPLUS LANCETS 28G MISC 28 g by Does not apply route QID. 120 each 2  No current facility-administered medications for this visit.     Musculoskeletal: Strength & Muscle Tone: Unable to assess due to telemedicine visit [place]: Unable to assess due to telemedicine visit Patient leans: Unable to assess due to telemedicine visit  Psychiatric Specialty Exam: Review of Systems  Psychiatric/Behavioral:  Negative for decreased concentration, dysphoric mood, hallucinations, self-injury, sleep disturbance and suicidal ideas. The patient is not nervous/anxious and is not hyperactive.    There were no vitals taken for this visit.There is no height or weight on file to calculate BMI.  General Appearance: Unable to assess due to telemedicine visit  Eye Contact:  Unable to assess due to telemedicine visit  Speech:  Clear and Coherent and Normal Rate  Volume:  Normal  Mood:  Euthymic  Affect:  Appropriate  Thought Process:  Coherent, Goal Directed, and Descriptions of Associations: Intact  Orientation:  Full (Time, Place, and  Person)  Thought Content: WDL   Suicidal Thoughts:  No  Homicidal Thoughts:  No  Memory:  Immediate;   Good Recent;   Good Remote;   Good  Judgement:  Fair  Insight:  Fair  Psychomotor Activity:  Normal  Concentration:  Concentration: Good and Attention Span: Good  Recall:  [employer] of Knowledge: Fair  Language: Fair  Akathisia:  Negative  Handed:  Right  AIMS (if indicated): not done  Assets:  Communication Skills Desire for Improvement Housing  ADL's:  Intact  Cognition: WNL  Sleep:  Fair   Screenings: AUDIT    Flowsheet Row Admission (Discharged) from 07/10/2013 in [facility]  Alcohol Use Disorder Identification Test Final Score (AUDIT) 12      GAD-7    Flowsheet Row Video Visit from 11/29/2021 in [facility] Office Visit from 10/25/2021 in [facility] Video Visit from 08/30/2021 in [facility] Office Visit from 05/31/2021 in [facility] Office Visit from 11/07/2020 in [facility]  Total GAD-7 Score 7 0 '6 21 4      ' PHQ2-9    Flowsheet Row Video Visit from 11/29/2021 in [facility] Office Visit from 10/25/2021 in [facility] Visit from 09/14/2021 in [place] Video Visit from 08/30/2021 in [facility] Office Visit from 05/31/2021 in [facility]  PHQ-2 Total Score 0 0 0 0 3  PHQ-9 Total Score -- -- -- -- 17      Flowsheet Row Video Visit from 11/29/2021 in [facility] from 10/05/2021 in [place] [facility] from 09/25/2021 in [facility] No Risk No Risk No Risk        Assessment and Plan:   [name] [name] is a 66 year old  female with a past psychiatric history significant for insomnia, generalized anxiety disorder, and major depressive disorder who presents to [facility] via virtual telephone visit for follow-up and medication management.  Patient reports that she has have a lot going on in her life, but states that she has overcome the majority of stressors.  Patient denies depression but still continues to endorse elevated anxiety.  Patient denies the need for dosage adjustments with her medications and is requesting that her medications be refilled at this time.  Patient's medications to  be e-prescribed to pharmacy of choice.  Collaboration of Care: Collaboration of Care: Medication Management AEB patient's medications being managed by this provider and Psychiatrist AEB patient being managed by this behavioral health provider  Patient/Guardian was advised Release of Information must be obtained prior to any record release in order to collaborate their care with an outside provider. Patient/Guardian was advised if they have not already done so to contact the registration department to sign all necessary forms in order for [place] to release information regarding their care.   Consent: Patient/Guardian gives verbal consent for treatment and assignment of benefits for services provided during this visit. Patient/Guardian expressed understanding and agreed to proceed.   1. Major depressive disorder, recurrent, in full remission with anxious distress (HCC)  - busPIRone (BUSPAR) 15 MG tablet; TAKE 2 TABLETS IN THE MORNING, 1 TAB AT NOON AND 1 TAB IN THE EVENING  Dispense: 120 tablet; Refill: 3 - doxepin (SINEQUAN) 75 MG capsule; Take 1 capsule (75 mg total) by mouth at bedtime.  Dispense: 30 capsule; Refill: 3 - escitalopram (LEXAPRO) 10 MG tablet; TAKE 3 TABLETS (30 MG TOTAL) BY MOUTH DAILY.  Dispense: 90 tablet; Refill: 3  2. GAD (generalized anxiety disorder)  - busPIRone (BUSPAR)  15 MG tablet; TAKE 2 TABLETS IN THE MORNING, 1 TAB AT NOON AND 1 TAB IN THE EVENING  Dispense: 120 tablet; Refill: 3 - escitalopram (LEXAPRO) 10 MG tablet; TAKE 3 TABLETS (30 MG TOTAL) BY MOUTH DAILY.  Dispense: 90 tablet; Refill: 3 - clonazePAM (KLONOPIN) 0.5 MG tablet; Take 1 tablet (0.5 mg total) by mouth 2 (two) times daily as needed for anxiety.  Dispense: 60 tablet; Refill: 2  3. Insomnia, unspecified type  - mirtazapine (REMERON) 30 MG tablet; TAKE 1 TABLET (30 MG TOTAL) BY MOUTH AT BEDTIME.  Dispense: 30 tablet; Refill: 3  Patient to follow up in 3 months Provider spent a total of 19 minutes with the patient/reviewing patient's chart  Malachy Mood, PA 11/29/2021, 8:21 PM

## 2021-11-30 ENCOUNTER — Encounter: Payer: Self-pay | Admitting: Hematology

## 2021-11-30 ENCOUNTER — Other Ambulatory Visit: Payer: Self-pay | Admitting: Internal Medicine

## 2021-11-30 ENCOUNTER — Other Ambulatory Visit: Payer: Self-pay

## 2021-11-30 DIAGNOSIS — I25118 Atherosclerotic heart disease of native coronary artery with other forms of angina pectoris: Secondary | ICD-10-CM

## 2021-12-01 ENCOUNTER — Encounter: Payer: Self-pay | Admitting: Internal Medicine

## 2021-12-03 ENCOUNTER — Telehealth: Payer: Self-pay | Admitting: Gastroenterology

## 2021-12-03 ENCOUNTER — Other Ambulatory Visit: Payer: Self-pay

## 2021-12-03 DIAGNOSIS — T184XXA Foreign body in colon, initial encounter: Secondary | ICD-10-CM

## 2021-12-03 NOTE — Telephone Encounter (Signed)
Inbound call from patients daughter stating that patient had a capsule endo done on 5/17 and still has not passed the capsule. Seeking advice, please advise.

## 2021-12-03 NOTE — Telephone Encounter (Signed)
Called and spoke with Tiffiney. I informed her that if the patient has not passed the capsule then she will need to come in for an x-ray. She is aware that pt can stop by the x-ray dept in the basement of our office at her convenience. I informed her that the x-ray dept is open between 8 am - 5 pm. I informed Tiffiney that we will call the patient with her results. Tiffiney verbalized understanding and had no concerns at the end of the call.  KUB order in epic.

## 2021-12-04 ENCOUNTER — Other Ambulatory Visit: Payer: Self-pay

## 2021-12-04 ENCOUNTER — Telehealth: Payer: Self-pay | Admitting: Gastroenterology

## 2021-12-04 ENCOUNTER — Ambulatory Visit (INDEPENDENT_AMBULATORY_CARE_PROVIDER_SITE_OTHER)
Admission: RE | Admit: 2021-12-04 | Discharge: 2021-12-04 | Disposition: A | Discharge status: Home / Self Care | Payer: Medicare Other | Source: Ambulatory Visit | Attending: Gastroenterology | Admitting: Gastroenterology

## 2021-12-04 DIAGNOSIS — T184XXA Foreign body in colon, initial encounter: Secondary | ICD-10-CM

## 2021-12-04 DIAGNOSIS — T189XXA Foreign body of alimentary tract, part unspecified, initial encounter: Secondary | ICD-10-CM | POA: Diagnosis not present

## 2021-12-04 DIAGNOSIS — Z0389 Encounter for observation for other suspected diseases and conditions ruled out: Secondary | ICD-10-CM | POA: Diagnosis not present

## 2021-12-04 NOTE — Telephone Encounter (Signed)
Please contact her.  The capsule endoscopy was completed, read by Dr. Loletha Carrow.  It was completely normal.   She should continue with iron infusions per oncology.   Lets get a CBC in 3 months and a follow-up office visit with me shortly afterwards.

## 2021-12-04 NOTE — Telephone Encounter (Signed)
Patient aware that capsule endoscopy was normal per Dr Ardis Hughs.  Patient advised to continue with iron infusions per oncology.  Patient advised she will need CBC and follow up in August 2023.  Staff message sent to myself as a reminder.  Patient agreed to plan and verbalized understanding. No further questions.

## 2021-12-05 ENCOUNTER — Other Ambulatory Visit: Payer: Self-pay

## 2021-12-11 ENCOUNTER — Encounter: Payer: Self-pay | Admitting: Gastroenterology

## 2021-12-12 ENCOUNTER — Encounter: Payer: Self-pay | Admitting: Hematology

## 2021-12-12 ENCOUNTER — Other Ambulatory Visit: Payer: Self-pay | Admitting: Internal Medicine

## 2021-12-12 ENCOUNTER — Other Ambulatory Visit: Payer: Self-pay

## 2021-12-12 MED ORDER — TRELEGY ELLIPTA 100-62.5-25 MCG/ACT IN AEPB
INHALATION_SPRAY | RESPIRATORY_TRACT | 2 refills | Status: DC
  Filled 2021-12-12: qty 60, fill #0
  Filled 2021-12-13: qty 60, 30d supply, fill #0
  Filled 2022-01-04: qty 60, 30d supply, fill #1
  Filled 2022-02-12: qty 60, 30d supply, fill #2

## 2021-12-12 MED ORDER — MONTELUKAST SODIUM 10 MG PO TABS
ORAL_TABLET | Freq: Every day | ORAL | 2 refills | Status: DC
Start: 2021-12-12 — End: 2022-04-16
  Filled 2021-12-12: qty 30, 30d supply, fill #0
  Filled 2022-01-22: qty 30, 30d supply, fill #1
  Filled 2022-02-20: qty 30, 30d supply, fill #2

## 2021-12-13 ENCOUNTER — Encounter: Payer: Self-pay | Admitting: Hematology

## 2021-12-13 ENCOUNTER — Other Ambulatory Visit: Payer: Self-pay

## 2021-12-14 ENCOUNTER — Other Ambulatory Visit: Payer: Self-pay

## 2021-12-17 NOTE — Progress Notes (Addendum)
Surgical Instructions    Your procedure is scheduled on Monday, June 12th.  Report to [facility] Main Entrance "A" at 1:00 P.M., then check in with the Admitting office.  Call this number if you have problems the morning of surgery:  [phone]   If you have any questions prior to your surgery date call [phone]: Open Monday-Friday 8am-4pm    Remember:  Do not eat after midnight the night before your surgery  You may drink clear liquids until 12:00 PM the afternoon of your surgery.   Clear liquids allowed are: Water, Non-Citrus Juices (without pulp), Carbonated Beverages, Clear Tea, Black Coffee ONLY (NO MILK, CREAM OR POWDERED CREAMER of any kind), and Gatorade  Please complete your PRE-SURGERY G2 that was provided to you by 12:00 PM the afternoon of surgery.  Please, if able, drink it in one sitting. DO NOT SIP.     Take these medicines the morning of surgery with A SIP OF WATER:   Amlodipine (Norvasc)  Escitalopram (Lexapro)  Gabapentin (Neurontin)  Levothyroxine (Synthroid)  Nebivolol (Bystolic)  Omeprazole (Prilosec)   If needed:  Tylenol  Albuterol Inhaler - bring with you on day of surgery  Clonazepam (Klonopin)  Trelegy Ellipta inhaler  Duoneb nebulizer  Methocarbamol (Robaxin)  Nitroglycerin    Follow your surgeon's instructions on when to stop Brilinta & Aspirin.  If no instructions were given by your surgeon then you will need to call the office to get those instructions.    As of today, STOP taking any Aleve, Naproxen, Ibuprofen, Motrin, Advil, Goody's, BC's, all herbal medications, fish oil, and all vitamins.  WHAT DO I DO ABOUT MY DIABETES MEDICATION?   Do not take oral diabetes medicines (pills) the morning of surgery. - Metformin   HOW TO MANAGE YOUR DIABETES BEFORE AND AFTER SURGERY  Why is it important to control my blood sugar before and after surgery? Improving blood sugar levels before and after surgery helps healing and can limit  problems. A way of improving blood sugar control is eating a healthy diet by:  Eating less sugar and carbohydrates  Increasing activity/exercise  Talking with your doctor about reaching your blood sugar goals High blood sugars (greater than 180 mg/dL) can raise your risk of infections and slow your recovery, so you will need to focus on controlling your diabetes during the weeks before surgery. Make sure that the doctor who takes care of your diabetes knows about your planned surgery including the date and location.  How do I manage my blood sugar before surgery? Check your blood sugar at least 4 times a day, starting 2 days before surgery, to make sure that the level is not too high or low.  Check your blood sugar the morning of your surgery when you wake up and every 2 hours until you get to the Short Stay unit.  If your blood sugar is less than 70 mg/dL, you will need to treat for low blood sugar: Do not take insulin. Treat a low blood sugar (less than 70 mg/dL) with  cup of clear juice (cranberry or apple), 4 glucose tablets, OR glucose gel. Recheck blood sugar in 15 minutes after treatment (to make sure it is greater than 70 mg/dL). If your blood sugar is not greater than 70 mg/dL on recheck, call [phone] for further instructions. Report your blood sugar to the short stay nurse when you get to Short Stay.  If you are admitted to the hospital after surgery: Your blood sugar will be  checked by the staff and you will probably be given insulin after surgery (instead of oral diabetes medicines) to make sure you have good blood sugar levels. The goal for blood sugar control after surgery is 80-180 mg/dL.   DAY OF SURGERY:         Do not wear jewelry or makeup Do not wear lotions, powders, perfumes, or deodorant. Do not shave 48 hours prior to surgery.   Do not bring valuables to the hospital. Do not wear nail polish, gel polish, artificial nails, or any other type of covering on  natural nails (fingers and toes) If you have artificial nails or gel coating that need to be removed by a nail salon, please have this removed prior to surgery. Artificial nails or gel coating may interfere with anesthesia's ability to adequately monitor your vital signs.  [facility] is not responsible for any belongings or valuables. .   Do NOT Smoke (Tobacco/Vaping)  24 hours prior to your procedure  If you use a CPAP at night, you may bring your mask for your overnight stay.   Contacts, glasses, hearing aids, dentures or partials may not be worn into surgery, please bring cases for these belongings   For patients admitted to the hospital, discharge time will be determined by your treatment team.   Patients discharged the day of surgery will not be allowed to drive home, and someone needs to stay with them for 24 hours.   SURGICAL WAITING ROOM VISITATION Patients having surgery or a procedure in a hospital may have two support people. Children under the age of 41 must have an adult with them who is not the patient. They may stay in the waiting area during the procedure and may switch out with other visitors. If the patient needs to stay at the hospital during part of their recovery, the visitor guidelines for inpatient rooms apply.  Please refer to the [facility] website for the visitor guidelines for Inpatients (after your surgery is over and you are in a regular room).    Special instructions:    Oral Hygiene is also important to reduce your risk of infection.  Remember - BRUSH YOUR TEETH THE MORNING OF SURGERY WITH YOUR REGULAR TOOTHPASTE   [place]- Preparing For Surgery  Before surgery, you can play an important role. Because skin is not sterile, your skin needs to be as free of germs as possible. You can reduce the number of germs on your skin by washing with CHG (chlorahexidine gluconate) Soap before surgery.  CHG is an antiseptic cleaner which kills germs and bonds with  the skin to continue killing germs even after washing.     Please do not use if you have an allergy to CHG or antibacterial soaps. If your skin becomes reddened/irritated stop using the CHG.  Do not shave (including legs and underarms) for at least 48 hours prior to first CHG shower. It is OK to shave your face.  Please follow these instructions carefully.     Shower the NIGHT BEFORE SURGERY and the MORNING OF SURGERY with CHG Soap.   If you chose to wash your hair, wash your hair first as usual with your normal shampoo. After you shampoo, rinse your hair and body thoroughly to remove the shampoo.  Then [employer] and genitals (private parts) with your normal soap and rinse thoroughly to remove soap.  After that Use CHG Soap as you would any other liquid soap. You can apply CHG directly to the  skin and wash gently with a scrungie or a clean washcloth.   Apply the CHG Soap to your body ONLY FROM THE NECK DOWN.  Do not use on open wounds or open sores. Avoid contact with your eyes, ears, mouth and genitals (private parts). Wash Face and genitals (private parts)  with your normal soap.   Wash thoroughly, paying special attention to the area where your surgery will be performed.  Thoroughly rinse your body with warm water from the neck down.  DO NOT shower/wash with your normal soap after using and rinsing off the CHG Soap.  Pat yourself dry with a CLEAN TOWEL.  Wear CLEAN PAJAMAS to bed the night before surgery  Place CLEAN SHEETS on your bed the night before your surgery  DO NOT SLEEP WITH PETS.   Day of Surgery:  Take a shower with CHG soap. Wear Clean/Comfortable clothing the morning of surgery Do not apply any deodorants/lotions.   Remember to brush your teeth WITH YOUR REGULAR TOOTHPASTE.   Please read over the following fact sheets that you were given.

## 2021-12-18 ENCOUNTER — Encounter (HOSPITAL_COMMUNITY): Payer: Self-pay

## 2021-12-18 ENCOUNTER — Other Ambulatory Visit: Payer: Self-pay

## 2021-12-18 ENCOUNTER — Encounter (HOSPITAL_COMMUNITY)
Admission: RE | Admit: 2021-12-18 | Discharge: 2021-12-18 | Disposition: A | Discharge status: Home / Self Care | Payer: Medicare Other | Source: Ambulatory Visit | Attending: Orthopaedic Surgery | Admitting: Orthopaedic Surgery

## 2021-12-18 VITALS — BP 154/87 | HR 66 | Temp 98.2°F | Resp 17 | Ht 61.0 in | Wt 144.1 lb

## 2021-12-18 DIAGNOSIS — Z87440 Personal history of urinary (tract) infections: Secondary | ICD-10-CM | POA: Insufficient documentation

## 2021-12-18 DIAGNOSIS — I252 Old myocardial infarction: Secondary | ICD-10-CM | POA: Diagnosis not present

## 2021-12-18 DIAGNOSIS — Z955 Presence of coronary angioplasty implant and graft: Secondary | ICD-10-CM | POA: Diagnosis not present

## 2021-12-18 DIAGNOSIS — F17200 Nicotine dependence, unspecified, uncomplicated: Secondary | ICD-10-CM | POA: Insufficient documentation

## 2021-12-18 DIAGNOSIS — Z01812 Encounter for preprocedural laboratory examination: Secondary | ICD-10-CM | POA: Insufficient documentation

## 2021-12-18 DIAGNOSIS — I10 Essential (primary) hypertension: Secondary | ICD-10-CM | POA: Insufficient documentation

## 2021-12-18 DIAGNOSIS — Z01818 Encounter for other preprocedural examination: Secondary | ICD-10-CM

## 2021-12-18 DIAGNOSIS — E119 Type 2 diabetes mellitus without complications: Secondary | ICD-10-CM | POA: Insufficient documentation

## 2021-12-18 DIAGNOSIS — K219 Gastro-esophageal reflux disease without esophagitis: Secondary | ICD-10-CM | POA: Insufficient documentation

## 2021-12-18 DIAGNOSIS — J449 Chronic obstructive pulmonary disease, unspecified: Secondary | ICD-10-CM | POA: Insufficient documentation

## 2021-12-18 DIAGNOSIS — I2510 Atherosclerotic heart disease of native coronary artery without angina pectoris: Secondary | ICD-10-CM | POA: Insufficient documentation

## 2021-12-18 DIAGNOSIS — E785 Hyperlipidemia, unspecified: Secondary | ICD-10-CM | POA: Diagnosis not present

## 2021-12-18 DIAGNOSIS — M75101 Unspecified rotator cuff tear or rupture of right shoulder, not specified as traumatic: Secondary | ICD-10-CM | POA: Insufficient documentation

## 2021-12-18 DIAGNOSIS — E039 Hypothyroidism, unspecified: Secondary | ICD-10-CM | POA: Insufficient documentation

## 2021-12-18 DIAGNOSIS — J45909 Unspecified asthma, uncomplicated: Secondary | ICD-10-CM | POA: Insufficient documentation

## 2021-12-18 LAB — GLUCOSE, CAPILLARY: Glucose-Capillary: 112 mg/dL — ABNORMAL HIGH (ref 70–99)

## 2021-12-18 LAB — BASIC METABOLIC PANEL
Anion gap: 10 (ref 5–15)
BUN: 12 mg/dL (ref 8–23)
CO2: 24 mmol/L (ref 22–32)
Calcium: 9.2 mg/dL (ref 8.9–10.3)
Chloride: 105 mmol/L (ref 98–111)
Creatinine, Ser: 0.98 mg/dL (ref 0.44–1.00)
GFR, Estimated: >60 mL/min (ref >60)
Glucose, Bld: 112 mg/dL — ABNORMAL HIGH (ref 70–99)
Potassium: 3.9 mmol/L (ref 3.5–5.1)
Sodium: 139 mmol/L (ref 135–145)

## 2021-12-18 LAB — HEMOGLOBIN A1C
Hgb A1c MFr Bld: 5.3 % (ref 4.8–5.6)
Mean Plasma Glucose: 105.41 mg/dL

## 2021-12-18 LAB — SURGICAL PCR SCREEN
MRSA, PCR: NEGATIVE
Staphylococcus aureus: NEGATIVE

## 2021-12-18 NOTE — Progress Notes (Signed)
PCP - Karle Plumber Cardiologist - [place] [facility]ologist - Owens Loffler  Received clearance - in paper chart  Chest x-ray - 10/05/21 EKG - 10/17/21 ECHO - 03/26/17 Cardiac Cath - 03/26/17 & 2011  DM - Typ 2  Fasting Blood Sugar - does not check CBGs at home, per patient   Blood Thinner Instructions: last dose on 12/15/21, per patient Aspirin Instructions: follow your surgeon's instruction on when to stop ASA  ERAS Protcol - yes, G2 given  Anesthesia review: yes, heart history  Patient denies shortness of breath, fever, cough and chest pain at PAT appointment   All instructions explained to the patient, with a verbal understanding of the material. Patient agrees to go over the instructions while at home for a better understanding. Patient also instructed to self quarantine after being tested for COVID-19. The opportunity to ask questions was provided.

## 2021-12-19 NOTE — Progress Notes (Signed)
Anesthesia Chart Review:  Case: [id number] Date/Time: 12/24/21 1450   Procedure: RIGHT SHOULDER ARTHROSCOPY WITH ROTATOR CUFF REPAIR AND BICEPS TENODESIS (Right: Shoulder)   Anesthesia type: General   Pre-op diagnosis: right shoulder rotator cuff tear, biceps tendinosis, superior labrum anterior posterior tear   Location: [facility] OR ROOM 05 / [facility]   Surgeons: Marybelle Killings, MD       DISCUSSION: Patient is a 66 year old female scheduled for the above procedure.  He was initially scheduled at a [place] surgery center earlier this month, but she was treated for a COPD exacerbation on 09/25/21, but so anesthesiologist recommended postponing surgery to she had fully recovered and felt she was likely more appropriate for [place], otherwise could consider anesthesia consult in person if out-patient setting was still being considered. Surgery delayed until 10/12/21. 10/05/21 CXR was without acute process. Reported + marijuana use, denied cocaine use in > 1 year.   History includes smoking, CAD (NSTEMI 11/06/09 + cocaine, s/p BMS RCA 11/08/09 & s/p BMS LAD and BMS D1 11/09/09; s/p DES RCA for ISR 01/29/10; CTO RCA due to ISR with left-to-right collaterals, 50% of proximal LAD stent, 40-50% D1 ISR, 70% mOM2, 50% pOM3, aggressive medical therapy 03/26/17), HTN, HLD, exertional dyspnea, psoriasis, GERD, hypothyroidism, DM2, CKD ("recurrent UTIs), bruises easily (on anti-platelet therapy), asthma, chronic back pain, GERD, hiatal hernia, spinal surgery.    - She had cardiology follow-up and preoperative evaluation on 10/17/21 with Coletta Memos, NP. He wrote, "Given past medical history and time since last visit, based on ACC/AHA guidelines, SRINIDHI LANDERS would be at acceptable risk for the planned procedure without further cardiovascular testing... Her RCRI is a class III risk, 6.6% risk of major cardiac event.  She is able to complete greater than 4 METS of physical activity.   Her Brilinta may be held for 5 days prior  to her surgery.  Her aspirin may be held for 5-7 days prior to her surgery.  Please resume as soon as hemostasis is achieved. Case reviewed with Dr. Claiborne Billings." Six month follow-up planned. EKG done and reviewed at that visit. Probable non-specific intraventricular conduction block/anterior infarct on tracing, which is similar to 02/11/21 EKG but not on 07/04/21 tracing. Low risk stress test, EF 54% 11/2019. Last Brilinta reported as 12/15/21.    - Patient had a hematology follow-up by Dr. 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 Smoking and iron deficiency anemia were felt to be etiologies of her thrombocytosis. Iron and GI work-up recommended. 4 month follow-up planned.  - She had preoperative medical evaluation by Dr. Wynetta Emery on 10/25/21 following ED visit for COPD exacerbation. She wrote, "Preoperative evaluation to rule out surgical contraindication Before clearing her for surgery, I will have her follow-up with Dr. Joya Gaskins for her COPD.  Unfortunately she does have flares intermittently which can delay surgery.  Advised to continue trilogy inhaler and her nebulizer treatments as needed.  Strongly advised to quit smoking.  She is wanting to try to quit before surgery.  Prescription sent for nicotine patches. -I will also have her see her gastroenterologist Dr. Ardis Hughs for the iron deficiency anemia and report of dark stools recently." Patient has since had unremarkable EGD and capsule endoscopy in May 2023.   - She had pulmonology follow-up and preoperative evaluation by Dr. Joya Gaskins on 11/13/21. He wrote, "  COPD Gold stage E with previous frequent exacerbations and hospitalizations stable now on Trelegy   The patient is stable at this time for planned anesthesia provided she can stop  smoking at least 2 weeks prior to surgery.  She is going to use nicotine patch 21 mg daily and superimposed nicotine lozenges 4 mg 3 times daily in an effort to stop smoking".   Recent evaluations by primary care, cardiology, pulmonology, hematology, and GI as outlined above. She denied chest pain and SOB at PAT RN visit.  Anesthesia team to evaluate on the day of surgery.    VS: BP (!) 154/87   Pulse 66   Temp 36.8 C (Oral)   Resp 17   Ht '5\' 1"'$  (1.549 m)   Wt 65.4 kg   SpO2 96%   BMI 27.23 kg/m    PROVIDERS: Ladell Pier, MD is PCP - Shelva Majestic, MD is cardiologist - Sullivan Lone, MD is hematologist - Asencion Noble, MD is pulmonologist - Owens Loffler, MD is GI   LABS: Labs reviewed: Acceptable for surgery. Results from 11/28/21 and 12/18/21 include: Lab Results  Component Value Date   WBC 7.1 11/28/2021   HGB 12.5 11/28/2021   HCT 38.3 11/28/2021   PLT 429.0 (H) 11/28/2021   GLUCOSE 112 (H) 12/18/2021   ALT 11 10/03/2021   AST 11 (L) 10/03/2021   NA 139 12/18/2021   K 3.9 12/18/2021   CL 105 12/18/2021   CREATININE 0.98 12/18/2021   BUN 12 12/18/2021   CO2 24 12/18/2021   TSH 0.502 10/25/2021   HGBA1C 5.3 12/18/2021    OTHER:  Capsule endoscopy 11/28/21: Summary: Complete negative capsule endoscopy with no findings to explain iron deficiency anemia or dark stools.  Continue iron infusions per oncology.  Avoid NSAIDs.  Follow serial hemoglobin. Office follow-up with Dr. Ardis Hughs.  EGD 11/19/21: IMPRESSION: - Erythematous mucosa in the antrum. Biopsied. - Small hiatal hernia without [place]'s erosions. - The examination was otherwise normal. - Biopsies were taken with a cold forceps for evaluation of celiac disease. - Per Dr. Ardis Hughs: "The biopsies from her stomach and duodenum were all essentially normal.  Given her iron deficiency and intermittent dark stools I think we should proceed with capsule endoscopy at her soonest convenience."   IMAGES: CXR  10/05/21: FINDINGS: - Cardiomediastinal silhouette unchanged in size and contour. No evidence of central vascular congestion. No interlobular septal thickening. - Linear opacity at the bilateral lung bases compatible with scarring/atelectasis. - No pneumothorax or pleural effusion. Coarsened interstitial markings, with no confluent airspace disease. - No acute displaced fracture. Degenerative changes of the spine. IMPRESSION: Negative for acute cardiopulmonary disease   MRI Right Shoulder 04/21/21: IMPRESSION: 1. Mild tendinosis of the supraspinatus tendon with a small partial-thickness bursal surface tear along the anterior-most aspect. 2. Mild tendinosis of the infraspinatus tendon. 3. Mild tendinosis of the subscapularis tendon with subcortical reactive marrow changes.   MRI C-spine 03/16/21: IMPRESSION: 1. Cervical spondylosis most pronounced at the C6-7 level where there is a disc osteophyte complex with right paracentral disc protrusion resulting in moderate right foraminal stenosis and mild left foraminal stenosis as well as mild canal stenosis. 2. Central disc protrusions at C4-5 and C5-6 contribute to mild canal stenosis at both levels.       EKG:  EKG 10/17/21 ([facility]): SR at 64 bpm - See DISCUSSION  EKG 07/04/21:  Sinus rhythm Borderline right axis deviation Baseline wander since last tracing no significant change Confirmed by Daleen Bo (  [id number]) on 07/04/2021 1:51:47 PM     CV: Nuclear stress test 11/25/19: Nuclear stress EF: 69%. The left ventricular ejection fraction is hyperdynamic (>65%). There was no ST segment deviation noted during stress. The study is normal. This is a low risk study.     Echo 03/26/17: Study Conclusions  - Left ventricle: The cavity size was normal. Systolic function was    normal. The estimated ejection fraction was in the range of 55%    to 60%. Wall motion was normal; there were no regional wall    motion  abnormalities. Doppler parameters are consistent with    abnormal left ventricular relaxation (grade 1 diastolic    dysfunction).      Cardiac cath 03/26/17: Chronic total occlusion of the right coronary due to in-stent restenosis. Left-to-right collaterals are noted. Diffuse 50% narrowing of the proximal LAD stent. Diffuse 40-50% in-stent restenosis of the first diagonal. 70% mid second obtuse marginal stenosis. 50% proximal third obtuse marginal stenosis. Widely patent left main coronary artery. Normal left ventricular systolic function, EF 50%, with EDP 5 mmHg.   RECOMMENDATIONS: Aggressive risk factor modification Care management to help with acquiring medication.   Past Medical History:  Diagnosis Date   Allergy    seasonal   Anemia    Anxiety    takes Lexapro daily   Arthritis    "back, from neck down pass my bra area" (03/25/2017)   Asthma    Bartholin gland cyst 08/29/2011   Bruises easily    pt is on Effient   Chronic back pain    herniated nucleus pulposus   Chronic back pain    "neck to bra area; lower back" (03/25/2017)   Chronic kidney disease    recurrent UTI's this year 2022   COPD (chronic obstructive pulmonary disease) (HCC)    early stages   Coronary artery disease    Depression    takes Klonopin daily   Diabetes mellitus without complication ([place])    Diverticulosis    Fibroadenoma of left breast    GERD (gastroesophageal reflux disease)    takes Nexium daily   H/O hiatal hernia    Heart attack ([place]) 2011   Hemorrhoids    Hernia    Hyperlipidemia    takes Lipitor daily   Hypertension    takes Losartan daily and Labetalol bid   Hypothyroidism    takes Synthroid daily   Insomnia    hydroxyzine prn   Joint pain    Pneumonia    "couple times" (03/25/2017)   Pre-diabetes    "just found out 1 wk ago" (03/25/2017)   Psoriasis    elbows,knees,back   Shortness of breath    with exertion   Slowing of urinary stream    Stress incontinence      Past Surgical History:  Procedure Laterality Date   [place]   "left one of my ovaries"   BACK SURGERY     BIOPSY  05/27/2019   Procedure: BIOPSY;  Surgeon: Daneil Dolin, MD;  Location: [facility];  Service: Endoscopy;;   COLONOSCOPY  2011   Dr. Ardis Hughs: Mild diverticulosis, descending diminutive colon polyp (not retrieved), next colonoscopy 10 years   CORONARY ANGIOPLASTY WITH STENT PLACEMENT  2011 X2   "regular stents didn't work; had to go back in in ~ 1 month and put in medicated stents"   DILATION AND CURETTAGE OF UTERUS     ESOPHAGOGASTRODUODENOSCOPY  ESOPHAGOGASTRODUODENOSCOPY (EGD) WITH PROPOFOL N/A 05/27/2019   normal esophagus, dilation, erosive gastropathy s/p biopsy, normal duodenum. Negative H.pylori.    LEFT HEART CATH AND CORONARY ANGIOGRAPHY N/A 03/26/2017   Procedure: LEFT HEART CATH AND CORONARY ANGIOGRAPHY;  Surgeon: Belva Crome, MD;  Location: [facility];  Service: Cardiovascular;  Laterality: N/A;   LUMBAR LAMINECTOMY/DECOMPRESSION MICRODISCECTOMY  08/05/2011   Procedure: LUMBAR LAMINECTOMY/DECOMPRESSION MICRODISCECTOMY;  Surgeon: Otilio Connors, MD;  Location: [facility];  Service: Neurosurgery;  Laterality: Right;  Right Lumbar four-five extraforaminal discectomy   MALONEY DILATION N/A 05/27/2019   Procedure: Venia Minks DILATION;  Surgeon: Daneil Dolin, MD;  Location: [facility];  Service: Endoscopy;  Laterality: N/A;  54   TONSILLECTOMY     as a child   UPPER GASTROINTESTINAL ENDOSCOPY      MEDICATIONS:  acetaminophen (TYLENOL) 500 MG tablet   albuterol (VENTOLIN HFA) 108 (90 Base) MCG/ACT inhaler   amLODipine (NORVASC) 5 MG tablet   aspirin EC 81 MG tablet   atorvastatin (LIPITOR) 80 MG tablet   Blood Glucose Monitoring Suppl (TRUE METRIX METER) DEVI   busPIRone (BUSPAR) 15 MG tablet   Chlorpheniramine-DM (COUGH & COLD HBP PO)   clonazePAM (KLONOPIN) 0.5 MG tablet    doxepin (SINEQUAN) 75 MG capsule   escitalopram (LEXAPRO) 10 MG tablet   ferrous sulfate 325 (65 FE) MG tablet   Fluticasone-Umeclidin-Vilant (TRELEGY ELLIPTA) 100-62.5-25 MCG/ACT AEPB   furosemide (LASIX) 20 MG tablet   gabapentin (NEURONTIN) 600 MG tablet   glucose blood (TRUE METRIX BLOOD GLUCOSE TEST) test strip   ipratropium-albuterol (DUONEB) 0.5-2.5 (3) MG/3ML SOLN   levothyroxine (SYNTHROID) 112 MCG tablet   loperamide (IMODIUM A-D) 2 MG tablet   losartan (COZAAR) 50 MG tablet   metFORMIN (GLUCOPHAGE) 500 MG tablet   methocarbamol (ROBAXIN) 500 MG tablet   mirtazapine (REMERON) 30 MG tablet   montelukast (SINGULAIR) 10 MG tablet   nebivolol (BYSTOLIC) 10 MG tablet   nicotine (NICODERM CQ - DOSED IN MG/24 HOURS) 21 mg/24hr patch   nicotine polacrilex (NICOTINE MINI) 4 MG lozenge   nitroGLYCERIN (NITROSTAT) 0.4 MG SL tablet   omeprazole (PRILOSEC) 40 MG capsule   sodium chloride (OCEAN) 0.65 % SOLN nasal spray   ticagrelor (BRILINTA) 60 MG TABS tablet   traMADol (ULTRAM) 50 MG tablet   TRUEPLUS LANCETS 28G MISC   No current facility-administered medications for this encounter.    Myra Gianotti, PA-C Surgical Short Stay/Anesthesiology [facility] Phone [phone] [facility] Phone [phone] 12/19/2021 1:55 PM

## 2021-12-19 NOTE — Anesthesia Preprocedure Evaluation (Addendum)
Anesthesia Evaluation  Patient identified by MRN, date of birth, ID band Patient awake    Reviewed: Allergy & Precautions, NPO status , Patient's Chart, lab work & pertinent test results, reviewed documented beta blocker date and time   Airway Mallampati: II  TM Distance: >3 FB Neck ROM: Full    Dental  (+) Edentulous Upper, Edentulous Lower   Pulmonary shortness of breath and with exertion, asthma , pneumonia, resolved, COPD,  COPD inhaler, Current Smoker and Patient abstained from smoking.,    Pulmonary exam normal breath sounds clear to auscultation       Cardiovascular hypertension, Pt. 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 Left-to-right collaterals are noted.  Diffuse 50% narrowing of the proximal LAD stent. Diffuse 40-50% in-stent restenosis of the first diagonal.  70% mid second obtuse marginal stenosis.  50% proximal third obtuse marginal stenosis.  Widely patent left main coronary artery.  Normal left ventricular systolic function, EF 78%, with EDP 5 mmHg   Neuro/Psych PSYCHIATRIC DISORDERS Anxiety Depression Bipolar Disorder negative neurological ROS     GI/Hepatic hiatal hernia, GERD  Medicated and Controlled,(+)     substance abuse  marijuana use,   Endo/Other  diabetes, Well Controlled, Type 2Hypothyroidism   Renal/GU Renal InsufficiencyRenal diseaseHyperlipidemia  negative genitourinary   Musculoskeletal  (+) Arthritis , Osteoarthritis,  Torn rotator cuff right shoulder with biceps tendonosis Torn labrum right shoulder   Abdominal   Peds  Hematology  (+) Blood dyscrasia, anemia , Brilinta therapy- last dose 6/5   Anesthesia Other Findings   Reproductive/Obstetrics                           Anesthesia  Physical Anesthesia Plan  ASA: 3  Anesthesia Plan:    Post-op Pain Management: Regional block*, Minimal or no pain anticipated, Precedex and Ketamine IV*   Induction: Intravenous  PONV Risk Score and Plan: 2 and Treatment may vary due to age or medical condition, Ondansetron and Dexamethasone  Airway Management Planned: Oral ETT  Additional Equipment: None  Intra-op Plan:   Post-operative Plan: Extubation in OR  Informed Consent: I have reviewed the patients History and Physical, chart, labs and discussed the procedure including the risks, benefits and alternatives for the proposed anesthesia with the patient or authorized representative who has indicated his/her understanding and acceptance.       Plan Discussed with: CRNA and Anesthesiologist  Anesthesia Plan Comments: (PAT note written 12/19/2021 by Myra Gianotti, PA-C. )       Anesthesia Quick Evaluation                                  Anesthesia Evaluation  Patient identified by MRN, date of birth, ID band Patient awake    Reviewed: Allergy & Precautions, NPO status , Patient's Chart, lab work & pertinent test results  Airway Mallampati: II  TM Distance: >3 FB Neck ROM: Full    Dental no notable dental hx. (+) Edentulous Upper, Edentulous Lower   Pulmonary shortness of breath and with exertion, asthma , pneumonia, resolved, COPD,  COPD inhaler, Current SmokerPatient did not abstain from smoking.,    Pulmonary exam normal breath sounds clear to auscultation       Cardiovascular Exercise Tolerance: Poor hypertension,  Pt. on medications + CAD, + Past MI and + Cardiac Stents  Normal cardiovascular examII Rhythm:Regular Rate:Normal  Limited ET DOE H/o MI/Stents -denies NTG use  Continues to smoke /smoked today    Neuro/Psych Anxiety Depression Bipolar Disorder negative neurological ROS  negative psych ROS   GI/Hepatic Neg liver ROS, hiatal hernia, GERD  Medicated and Controlled,   Endo/Other  diabetes, Type 2, Oral Hypoglycemic AgentsHypothyroidism   Renal/GU negative Renal ROS  negative genitourinary   Musculoskeletal negative musculoskeletal ROS (+)   Abdominal   Peds negative pediatric ROS (+)  Hematology negative hematology ROS (+)   Anesthesia Other Findings   Reproductive/Obstetrics negative OB ROS                             Anesthesia Physical Anesthesia Plan  ASA: IV  Anesthesia Plan: General   Post-op Pain Management:    Induction: Intravenous  PONV Risk Score and Plan: 2 and TIVA, Propofol infusion and Treatment may vary due to age or medical condition  Airway Management Planned: Simple Face Mask and Nasal Cannula  Additional Equipment:   Intra-op Plan:   Post-operative Plan:   Informed Consent: I have reviewed the patients History and Physical, chart, labs and discussed the procedure including the risks, benefits and alternatives for the proposed anesthesia with the patient or authorized representative who has indicated his/her understanding and acceptance.     Dental advisory given  Plan Discussed with: CRNA  Anesthesia Plan Comments: (Plan Full PPE use  Plan GA with GETA as needed d/w pt -WTP with same after Q&A  D/w pt increased risk of needing ETT use  D/w pt unlikely need for postprocedure ventilation -voiced understanding -WTP)        Anesthesia Quick Evaluation

## 2021-12-20 ENCOUNTER — Ambulatory Visit (INDEPENDENT_AMBULATORY_CARE_PROVIDER_SITE_OTHER): Payer: Medicare Other

## 2021-12-20 DIAGNOSIS — Z0000 Encounter for general adult medical examination without abnormal findings: Secondary | ICD-10-CM

## 2021-12-20 NOTE — Progress Notes (Addendum)
Subjective:   [name] [name] is a 66 y.o. female who presents for Medicare Annual (Subsequent) preventive examination. I discussed the limitations of evaluation and management by telemedicine and the availability of in person appointments. The patient expressed understanding and agreed to proceed.   Visit performed by audio   Patient location: Home  Provider location: Home  Review of Systems    N/A       Objective:    Today's Vitals   12/20/21 0941  PainSc: 10-Worst pain ever   There is no height or weight on file to calculate BMI.     12/20/2021   11:09 AM 12/18/2021    3:12 PM 10/05/2021    3:10 PM 03/17/2021   11:28 AM 02/11/2021   12:04 AM 12/16/2020    3:41 PM 05/21/2020   12:58 AM  Advanced Directives  Does Patient Have a Medical Advance Directive? _0  No No  Would patient like information on creating a medical advance directive?  No - Patient declined  No - Patient declined  No - Patient declined No - Patient declined    Current Medications (verified) Outpatient Encounter Medications as of 12/20/2021  Medication Sig   acetaminophen (TYLENOL) 500 MG tablet Take 1,000 mg by mouth every 6 (six) hours as needed for moderate pain.   albuterol (VENTOLIN HFA) 108 (90 Base) MCG/ACT inhaler INHALE 2 PUFFS INTO THE LUNGS EVERY 4-6 HOURS AS NEEDED FOR WHEEZING OR SHORTNESS OF BREATH.   amLODipine (NORVASC) 5 MG tablet TAKE 1 TABLET (5 MG TOTAL) BY MOUTH DAILY.   atorvastatin (LIPITOR) 80 MG tablet Take 1 tablet (80 mg total) by mouth daily. please schedule appointment for more refills   Blood Glucose Monitoring Suppl (TRUE METRIX METER) DEVI 1 kit by Does not apply route 4 (four) times daily.   busPIRone (BUSPAR) 15 MG tablet TAKE 2 TABLETS IN THE MORNING, 1 TAB AT NOON AND 1 TAB IN THE EVENING (Patient taking differently: Take 60 mg by mouth at bedtime.)   clonazePAM (KLONOPIN) 0.5 MG tablet Take 1 tablet (0.5 mg total) by mouth 2 (two) times daily as needed for anxiety.    doxepin (SINEQUAN) 75 MG capsule Take 1 capsule (75 mg total) by mouth at bedtime.   escitalopram (LEXAPRO) 10 MG tablet TAKE 3 TABLETS (30 MG TOTAL) BY MOUTH DAILY.   Fluticasone-Umeclidin-Vilant (TRELEGY ELLIPTA) 100-62.5-25 MCG/ACT AEPB Inhale 1 puff into the lungs daily   furosemide (LASIX) 20 MG tablet Take 1 tablet (20 mg total) by mouth daily.   gabapentin (NEURONTIN) 600 MG tablet TAKE 1 TABLET BY MOUTH 4 TIMES DAILY   ipratropium-albuterol (DUONEB) 0.5-2.5 (3) MG/3ML SOLN TAKE 3 MLS VIA NEBULIZATION EVERY 6 HOURS (Patient taking differently: Take 3 mLs by nebulization every 6 (six) hours as needed (shortness of breath or wheezing).)   levothyroxine (SYNTHROID) 112 MCG tablet Take 1 tablet (112 mcg total) by mouth daily.   loperamide (IMODIUM A-D) 2 MG tablet Take 4 mg by mouth 4 (four) times daily as needed for diarrhea or loose stools.   losartan (COZAAR) 50 MG tablet TAKE 1 & 1/2 TABLETS BY MOUTH DAILY.   metFORMIN (GLUCOPHAGE) 500 MG tablet TAKE 1 TABLET (500 MG TOTAL) BY MOUTH 2 (TWO) TIMES DAILY WITH A MEAL.   methocarbamol (ROBAXIN) 500 MG tablet Take 1 tablet (500 mg total) by mouth 2 (two) times daily as needed for muscle spasms.   mirtazapine (REMERON) 30 MG tablet TAKE 1 TABLET (30 MG TOTAL) BY  MOUTH AT BEDTIME.   montelukast (SINGULAIR) 10 MG tablet TAKE 1 TABLET BY MOUTH AT BEDTIME.   nebivolol (BYSTOLIC) 10 MG tablet TAKE 1 TABLET (10 MG TOTAL) BY MOUTH DAILY.   nicotine (NICODERM CQ - DOSED IN MG/24 HOURS) 21 mg/24hr patch Place 1 patch (21 mg total) onto the skin daily.   nicotine polacrilex (NICOTINE MINI) 4 MG lozenge Use three times a day as needed to stop smoking   nitroGLYCERIN (NITROSTAT) 0.4 MG SL tablet Place 1 tablet (0.4 mg total) under the tongue every 5 (five) minutes as needed for chest pain.   omeprazole (PRILOSEC) 40 MG capsule TAKE 1 CAPSULE (40 MG TOTAL) BY MOUTH 2 (TWO) TIMES DAILY BEFORE A MEAL.   sodium chloride (OCEAN) 0.65 % SOLN nasal spray Place 1  spray into both nostrils as needed for congestion.   traMADol (ULTRAM) 50 MG tablet Take 1 tablet (50 mg total) by mouth every 12 (twelve) hours as needed.   TRUEPLUS LANCETS 28G MISC 28 g by Does not apply route QID.   aspirin EC 81 MG tablet Take 81 mg by mouth daily. (Patient not taking: Reported on 12/20/2021)   Chlorpheniramine-DM (COUGH & COLD HBP PO) Take 1 tablet by mouth daily as needed (congestion).   ferrous sulfate 325 (65 FE) MG tablet Take 1 tablet (325 mg total) by mouth daily with breakfast. (Patient not taking: Reported on 12/20/2021)   glucose blood (TRUE METRIX BLOOD GLUCOSE TEST) test strip Use as instructed   ticagrelor (BRILINTA) 60 MG TABS tablet TAKE 1 TABLET (60 MG TOTAL) BY MOUTH 2 (TWO) TIMES DAILY. (Patient not taking: Reported on 12/20/2021)   No facility-administered encounter medications on file as of 12/20/2021.    Allergies (verified) Avocado, Latex, and Codeine   History: Past Medical History:  Diagnosis Date   Allergy    seasonal   Anemia    Anxiety    takes Lexapro daily   Arthritis    "back, from neck down pass my bra area" (03/25/2017)   Asthma    Bartholin gland cyst 08/29/2011   Bruises easily    pt is on Effient   Chronic back pain    herniated nucleus pulposus   Chronic back pain    "neck to bra area; lower back" (03/25/2017)   Chronic kidney disease    recurrent UTI's this year 2022   COPD (chronic obstructive pulmonary disease) (HCC)    early stages   Coronary artery disease    Depression    takes Klonopin daily   Diabetes mellitus without complication ([place])    Diverticulosis    Fibroadenoma of left breast    GERD (gastroesophageal reflux disease)    takes Nexium daily   H/O hiatal hernia    Heart attack ([place]) 2011   Hemorrhoids    Hernia    Hyperlipidemia    takes Lipitor daily   Hypertension    takes Losartan daily and Labetalol bid   Hypothyroidism    takes Synthroid daily   Insomnia    hydroxyzine prn   Joint pain     Pneumonia    "couple times" (03/25/2017)   Pre-diabetes    "just found out 1 wk ago" (03/25/2017)   Psoriasis    elbows,knees,back   Shortness of breath    with exertion   Slowing of urinary stream    Stress incontinence    Past Surgical History:  Procedure Laterality Date   ABDOMINAL EXPLORATION SURGERY  1977   ABDOMINAL HYSTERECTOMY  1977   "left one of my ovaries"   BACK SURGERY     BIOPSY  05/27/2019   Procedure: BIOPSY;  Surgeon: Daneil Dolin, MD;  Location[facility];  Service: Endoscopy;;   COLONOSCOPY  2011   Dr. Ardis Hughs: Mild diverticulosis, descending diminutive colon polyp (not retrieved), next colonoscopy 10 years   CORONARY ANGIOPLASTY WITH STENT PLACEMENT  2011 X2   "regular stents didn't work; had to go back in in ~ 1 month and put in medicated stents"   [place]     ESOPHAGOGASTRODUODENOSCOPY     ESOPHAGOGASTRODUODENOSCOPY (EGD) WITH PROPOFOL N/A 05/27/2019   normal esophagus, dilation, erosive gastropathy s/p biopsy, normal duodenum. Negative H.pylori.    LEFT HEART CATH AND CORONARY ANGIOGRAPHY N/A 03/26/2017   Procedure: LEFT HEART CATH AND CORONARY ANGIOGRAPHY;  Surgeon: Belva Crome, MD;  Location: [facility];  Service: Cardiovascular;  Laterality: N/A;   LUMBAR LAMINECTOMY/DECOMPRESSION MICRODISCECTOMY  08/05/2011   Procedure: LUMBAR LAMINECTOMY/DECOMPRESSION MICRODISCECTOMY;  Surgeon: Otilio Connors, MD;  Location: [facility];  Service: Neurosurgery;  Laterality: Right;  Right Lumbar four-five extraforaminal discectomy   MALONEY DILATION N/A 05/27/2019   Procedure: Venia Minks DILATION;  Surgeon: Daneil Dolin, MD;  [facility];  Service: Endoscopy;  Laterality: N/A;  53   TONSILLECTOMY     as a child   UPPER GASTROINTESTINAL ENDOSCOPY     Family History  Problem Relation Age of Onset   Heart disease Mother    Hypertension Mother    Stroke Mother    Mental illness Mother    Heart disease Father     Hypertension Father    Diabetes Father    Colon polyps Brother    Breast cancer Maternal Aunt    Throat cancer Maternal Uncle    Thyroid disease Paternal Aunt    Heart disease Maternal Grandfather    Cancer Paternal Grandmother        [place]   Anesthesia problems Daughter    Hypotension Neg Hx    Malignant hyperthermia Neg Hx    Pseudochol deficiency Neg Hx    Colon cancer Neg Hx    Stomach cancer Neg Hx    Esophageal cancer Neg Hx    Pancreatic cancer Neg Hx    Rectal cancer Neg Hx    Social History   Socioeconomic History   Marital status: Divorced    Spouse name: Not on file   Number of children: 1   Years of education: Not on file   Highest education level: 9th grade  Occupational History   Not on file  Tobacco Use   Smoking status: Every Day    Packs/day: 0.50    Years: 50.00    Total pack years: 25.00    Types: Cigarettes   Smokeless tobacco: Never  Vaping Use   Vaping Use: Some days   Substances: Nicotine  Substance and Sexual Activity   Alcohol use: Yes    Comment: once a week   Drug use: Yes    Types: Marijuana    Comment: history of cocaine use, been about a year, per patient (12/18/21)   Sexual activity: Not Currently    Birth control/protection: Surgical  Other Topics Concern   Not on file  Social History Narrative   Not on file   Social Determinants of Health   Financial Resource Strain: Not on file  Food Insecurity: Food Insecurity Present (12/20/2021)   Hunger Vital Sign  Worried About Charity fundraiser in the Last Year: Sometimes true    [employer] of Food in the Last Year: Sometimes true  Transportation Needs: Unmet Transportation Needs (12/20/2021)   PRAPARE - Hydrologist (Medical): Yes    Lack of Transportation (Non-Medical): No  Physical Activity: Inactive (12/20/2021)   Exercise Vital Sign    Days of Exercise per Week: 0 days    Minutes of Exercise per Session: 0 min  Stress: Stress Concern Present (12/20/2021)    [place]    Feeling of Stress : To some extent  Social Connections: Moderately Isolated (12/20/2021)   Social Connection and Isolation Panel [NHANES]    Frequency of Communication with Friends and Family: More than three times a week    Frequency of Social Gatherings with Friends and Family: More than three times a week    Attends Religious Services: 1 to 4 times per year    Active Member of [employer] or Organizations: No    Attends Music therapist: Never    Marital Status: Divorced    Tobacco Counseling Ready to quit: Not Answered Counseling given: Not Answered   Clinical Intake:  Pre-visit preparation completed: Yes  Pain : 0-10 Pain Score: 10-Worst pain ever Pain Type: Chronic pain Pain Location: Shoulder Pain Orientation: Right Pain Descriptors / Indicators: Burning, Aching, Throbbing Pain Onset: More than a month ago Pain Frequency: Constant Pain Relieving Factors: Tramadol  Pain Relieving Factors: Tramadol  Diabetes: Yes (Pre diabetes)  How often do you need to have someone help you when you read instructions, pamphlets, or other written materials from your doctor or pharmacy?: 4 - Often What is the last grade level you completed in school?: 9th Grade  Diabetic?No  Interpreter Needed?: No  Information entered by :: Anson [place] CMA   Activities of Daily Living    12/20/2021   11:11 AM 12/18/2021    3:14 PM  In your present state of health, do you have any difficulty performing the following activities:  Hearing? 0   Vision? 0   Difficulty concentrating or making decisions? 0   Walking or climbing stairs? 0   Dressing or bathing? 0   Doing errands, shopping? 0 0  Preparing Food and eating ? N   Using the Toilet? N   In the past six months, have you accidently leaked urine? Y   Do you have problems with loss of bowel control? Y   Managing your Medications? Y   Managing your  Finances? N   Housekeeping or managing your Housekeeping? N     Patient Care Team: Theresia Lo, NP as PCP - General (Nurse Practitioner) Troy Sine, MD as PCP - Cardiology (Cardiology) Gala Romney [name], MD as Consulting Physician (Gastroenterology)  Indicate any recent Medical Services you may have received from other than [facility] providers in the past year (date may be approximate).     Assessment:   This is a routine wellness examination for Sinda.  Hearing/Vision screen No results found.  Dietary issues and exercise activities discussed:     Goals Addressed   None    Depression Screen    12/20/2021   11:02 AM 11/29/2021   11:35 AM 10/25/2021    9:17 AM 09/14/2021    9:43 AM 08/30/2021    1:19 PM 05/31/2021    8:23 AM 02/26/2021    1:39 PM  PHQ 2/9 Scores  PHQ -  2 Score 0  0 0     PHQ- 9 Score         Exception Documentation       Patient refusal     Information is confidential and restricted. Go to Review Flowsheets to unlock data.    Fall Risk    12/20/2021   11:11 AM 11/13/2021    2:50 PM 10/25/2021    9:17 AM 09/14/2021    9:43 AM 02/26/2021    1:39 PM  Fall Risk   Falls in the past year? 1 0 0 0 0  Number falls in past yr: 0 0 0 0   Injury with Fall? 1 0 0 0   Risk for fall due to : History of fall(s) No Fall Risks No Fall Risks No Fall Risks   Follow up Falls evaluation completed        FALL RISK PREVENTION PERTAINING TO THE HOME:  Any stairs in or around the home? Yes  If so, are there any without handrails? No  Home free of loose throw rugs in walkways, pet beds, electrical cords, etc? Yes  Adequate lighting in your home to reduce risk of falls? Yes   ASSISTIVE DEVICES UTILIZED TO PREVENT FALLS:  Life alert? No  Use of a cane, walker or w/c? No  Grab bars in the bathroom? Yes  Shower chair or bench in shower? Yes  Elevated toilet seat or a handicapped toilet? No   TIMED UP AND GO:  Was the test performed? No .  Length of time to ambulate 10  feet: 0 sec.     Cognitive Function:        Immunizations Immunization History  Administered Date(s) Administered   Influenza Split 08/06/2011   Influenza Whole 04/05/2019   Influenza,inj,Quad PF,6+ Mos 07/10/2013, 08/09/2014, 04/16/2016, 03/27/2017, 04/02/2018, 06/15/2020, 09/14/2021   PFIZER(Purple Top)SARS-COV-2 Vaccination 09/23/2019, 10/14/2019   Pneumococcal Polysaccharide-23 08/07/2011, 03/27/2017   Tdap 03/17/2018    TDAP status: Up to date  Flu Vaccine status: Up to date  Pneumococcal vaccine status: Due, Education has been provided regarding the importance of this vaccine. Advised may receive this vaccine at local pharmacy or Health Dept. Aware to provide a copy of the vaccination record if obtained from local pharmacy or Health Dept. Verbalized acceptance and understanding.  Covid-19 vaccine status: Declined, Education has been provided regarding the importance of this vaccine but patient still declined. Advised may receive this vaccine at local pharmacy or Health Dept.or vaccine clinic. Aware to provide a copy of the vaccination record if obtained from local pharmacy or Health Dept. Verbalized acceptance and understanding.  Qualifies for Shingles Vaccine? Yes   Zostavax completed No   Shingrix Completed?: No.    Education has been provided regarding the importance of this vaccine. Patient has been advised to call insurance company to determine out of pocket expense if they have not yet received this vaccine. Advised may also receive vaccine at local pharmacy or Health Dept. Verbalized acceptance and understanding.  Screening Tests Health Maintenance  Topic Date Due   Hepatitis C Screening  Never done   Zoster Vaccines- Shingrix (1 of 2) Never done   COVID-19 Vaccine (3 - Pfizer risk series) 11/11/2019   DEXA SCAN  Never done   Pneumonia Vaccine 65+ Years old (2 - PCV) 09/15/2022 (Originally 03/27/2018)   INFLUENZA VACCINE  02/12/2022   MAMMOGRAM  11/27/2023    COLONOSCOPY (Pts 45-68yr Insurance coverage will need to be confirmed)  02/20/2028   TETANUS/TDAP  03/17/2028   HIV Screening  Completed   HPV VACCINES  Aged Out    Health Maintenance  Health Maintenance Due  Topic Date Due   Hepatitis C Screening  Never done   Zoster Vaccines- Shingrix (1 of 2) Never done   COVID-19 Vaccine (3 - Pfizer risk series) 11/11/2019   DEXA SCAN  Never done    Colorectal cancer screening: Type of screening: Colonoscopy. Completed 02/2021. Repeat every 10 years  Mammogram status: Completed 09/2021. Repeat every year   Lung Cancer Screening: (Low Dose CT Chest recommended if Age 1-80 years, 30 pack-year currently smoking OR have quit w/in 15years.) does qualify.   Lung Cancer Screening Referral: Yes  Additional Screening:  Hepatitis C Screening: does qualify; Completed No  Vision Screening: Recommended annual ophthalmology exams for early detection of glaucoma and other disorders of the eye. Is the patient up to date with their annual eye exam?  No  Who is the provider or what is the name of the office in which the patient attends annual eye exams? N/A If pt is not established with a provider, would they like to be referred to a provider to establish care? No .   Dental Screening: Recommended annual dental exams for proper oral hygiene  Community Resource Referral / Chronic Care Management: CRR required this visit?  No   CCM required this visit?  No      Plan:     I have personally reviewed and noted the following in the patient's chart:   Medical and social history Use of alcohol, tobacco or illicit drugs  Current medications and supplements including opioid prescriptions.  Functional ability and status Nutritional status Physical activity Advanced directives List of other physicians Hospitalizations, surgeries, and ER visits in previous 12 months Vitals Screenings to include cognitive, depression, and falls Referrals and  appointments  In addition, I have reviewed and discussed with patient certain preventive protocols, quality metrics, and best practice recommendations. A written personalized care plan for preventive services as well as general preventive health recommendations were provided to patient.     Renato Gails, [place]   12/20/2021   Nurse Notes: Patient would like a Low dose CT scan referral placed, also a referral to the local [facility] for exercise if transportation can be offered. Ms. Heidemann , Thank you for taking time to come for your Medicare Wellness Visit. I appreciate your ongoing commitment to your health goals. Please review the following plan we discussed and let me know if I can assist you in the future.   These are the goals we discussed:  Goals   None     This is a list of the screening recommended for you and due dates:  Health Maintenance  Topic Date Due   Hepatitis C Screening: USPSTF Recommendation to screen - Ages 73-79 yo.  Never done   Zoster (Shingles) Vaccine (1 of 2) Never done   COVID-19 Vaccine (3 - Pfizer risk series) 11/11/2019   DEXA scan (bone density measurement)  Never done   Pneumonia Vaccine (2 - PCV) 09/15/2022*   Flu Shot  02/12/2022   Mammogram  11/27/2023   Colon Cancer Screening  02/20/2028   Tetanus Vaccine  03/17/2028   HIV Screening  Completed   HPV Vaccine  Aged Out  *Topic was postponed. The date shown is not the original due date.    I have reviewed and agreed to the above documentation.   Theresia Lo, FNP

## 2021-12-24 ENCOUNTER — Encounter (HOSPITAL_COMMUNITY)
Admission: RE | Disposition: A | Discharge status: Home / Self Care | Payer: Self-pay | Source: Home / Self Care | Attending: Orthopaedic Surgery

## 2021-12-24 ENCOUNTER — Ambulatory Visit (HOSPITAL_BASED_OUTPATIENT_CLINIC_OR_DEPARTMENT_OTHER): Payer: Medicare Other | Admitting: Anesthesiology

## 2021-12-24 ENCOUNTER — Other Ambulatory Visit: Payer: Self-pay

## 2021-12-24 ENCOUNTER — Ambulatory Visit (HOSPITAL_COMMUNITY): Payer: Medicare Other | Admitting: Physician Assistant

## 2021-12-24 ENCOUNTER — Ambulatory Visit (HOSPITAL_COMMUNITY)
Admission: RE | Admit: 2021-12-24 | Discharge: 2021-12-24 | Disposition: A | Discharge status: Home / Self Care | Payer: Medicare Other | Attending: Orthopaedic Surgery | Admitting: Orthopaedic Surgery

## 2021-12-24 ENCOUNTER — Encounter (HOSPITAL_COMMUNITY): Payer: Self-pay | Admitting: Orthopaedic Surgery

## 2021-12-24 DIAGNOSIS — K449 Diaphragmatic hernia without obstruction or gangrene: Secondary | ICD-10-CM | POA: Insufficient documentation

## 2021-12-24 DIAGNOSIS — M1990 Unspecified osteoarthritis, unspecified site: Secondary | ICD-10-CM | POA: Insufficient documentation

## 2021-12-24 DIAGNOSIS — I129 Hypertensive chronic kidney disease with stage 1 through stage 4 chronic kidney disease, or unspecified chronic kidney disease: Secondary | ICD-10-CM | POA: Insufficient documentation

## 2021-12-24 DIAGNOSIS — F419 Anxiety disorder, unspecified: Secondary | ICD-10-CM | POA: Insufficient documentation

## 2021-12-24 DIAGNOSIS — M7501 Adhesive capsulitis of right shoulder: Secondary | ICD-10-CM

## 2021-12-24 DIAGNOSIS — E1122 Type 2 diabetes mellitus with diabetic chronic kidney disease: Secondary | ICD-10-CM | POA: Diagnosis not present

## 2021-12-24 DIAGNOSIS — S43431A Superior glenoid labrum lesion of right shoulder, initial encounter: Secondary | ICD-10-CM

## 2021-12-24 DIAGNOSIS — D638 Anemia in other chronic diseases classified elsewhere: Secondary | ICD-10-CM | POA: Diagnosis not present

## 2021-12-24 DIAGNOSIS — Z01818 Encounter for other preprocedural examination: Secondary | ICD-10-CM

## 2021-12-24 DIAGNOSIS — M75121 Complete rotator cuff tear or rupture of right shoulder, not specified as traumatic: Secondary | ICD-10-CM

## 2021-12-24 DIAGNOSIS — F319 Bipolar disorder, unspecified: Secondary | ICD-10-CM | POA: Diagnosis not present

## 2021-12-24 DIAGNOSIS — I252 Old myocardial infarction: Secondary | ICD-10-CM | POA: Insufficient documentation

## 2021-12-24 DIAGNOSIS — J449 Chronic obstructive pulmonary disease, unspecified: Secondary | ICD-10-CM | POA: Diagnosis not present

## 2021-12-24 DIAGNOSIS — F17210 Nicotine dependence, cigarettes, uncomplicated: Secondary | ICD-10-CM | POA: Insufficient documentation

## 2021-12-24 DIAGNOSIS — K219 Gastro-esophageal reflux disease without esophagitis: Secondary | ICD-10-CM | POA: Diagnosis not present

## 2021-12-24 DIAGNOSIS — R0602 Shortness of breath: Secondary | ICD-10-CM | POA: Diagnosis not present

## 2021-12-24 DIAGNOSIS — Y939 Activity, unspecified: Secondary | ICD-10-CM | POA: Insufficient documentation

## 2021-12-24 DIAGNOSIS — N189 Chronic kidney disease, unspecified: Secondary | ICD-10-CM | POA: Insufficient documentation

## 2021-12-24 DIAGNOSIS — M7521 Bicipital tendinitis, right shoulder: Secondary | ICD-10-CM

## 2021-12-24 DIAGNOSIS — I2510 Atherosclerotic heart disease of native coronary artery without angina pectoris: Secondary | ICD-10-CM | POA: Insufficient documentation

## 2021-12-24 DIAGNOSIS — D631 Anemia in chronic kidney disease: Secondary | ICD-10-CM | POA: Diagnosis not present

## 2021-12-24 DIAGNOSIS — Z955 Presence of coronary angioplasty implant and graft: Secondary | ICD-10-CM | POA: Diagnosis not present

## 2021-12-24 DIAGNOSIS — E039 Hypothyroidism, unspecified: Secondary | ICD-10-CM | POA: Diagnosis not present

## 2021-12-24 DIAGNOSIS — X58XXXA Exposure to other specified factors, initial encounter: Secondary | ICD-10-CM | POA: Diagnosis not present

## 2021-12-24 DIAGNOSIS — G8918 Other acute postprocedural pain: Secondary | ICD-10-CM | POA: Diagnosis not present

## 2021-12-24 DIAGNOSIS — S43432A Superior glenoid labrum lesion of left shoulder, initial encounter: Secondary | ICD-10-CM

## 2021-12-24 HISTORY — PX: SHOULDER ARTHROSCOPY WITH ROTATOR CUFF REPAIR AND OPEN BICEPS TENODESIS: SHX6677

## 2021-12-24 LAB — GLUCOSE, CAPILLARY: Glucose-Capillary: 89 mg/dL (ref 70–99)

## 2021-12-24 SURGERY — SHOULDER ARTHROSCOPY WITH ROTATOR CUFF REPAIR AND OPEN BICEPS TENODESIS
Anesthesia: General | Site: Shoulder | Laterality: Right

## 2021-12-24 MED ORDER — OXYCODONE HCL 5 MG PO TABS: 5.0000 mg | ORAL_TABLET | Freq: Once | ORAL | Status: DC | PRN

## 2021-12-24 MED ORDER — SUGAMMADEX SODIUM 200 MG/2ML IV SOLN
INTRAVENOUS | Status: DC | PRN
  Administered 2021-12-24: 180 mg via INTRAVENOUS

## 2021-12-24 MED ORDER — FENTANYL CITRATE (PF) 250 MCG/5ML IJ SOLN
INTRAMUSCULAR | Status: AC
  Filled 2021-12-24: qty 5

## 2021-12-24 MED ORDER — SODIUM CHLORIDE 0.9 % IR SOLN
Status: DC | PRN
  Administered 2021-12-24: 6000 mL

## 2021-12-24 MED ORDER — CEFAZOLIN SODIUM-DEXTROSE 2-4 GM/100ML-% IV SOLN
2.0000 g | INTRAVENOUS | Status: AC
  Administered 2021-12-24: 2 g via INTRAVENOUS

## 2021-12-24 MED ORDER — ONDANSETRON HCL 4 MG/2ML IJ SOLN
INTRAMUSCULAR | Status: DC | PRN
  Administered 2021-12-24: 4 mg via INTRAVENOUS

## 2021-12-24 MED ORDER — FENTANYL CITRATE (PF) 250 MCG/5ML IJ SOLN
INTRAMUSCULAR | Status: DC | PRN
Start: 2021-12-24 — End: 2021-12-24
  Administered 2021-12-24: 100 ug via INTRAVENOUS

## 2021-12-24 MED ORDER — BUPIVACAINE-EPINEPHRINE (PF) 0.25% -1:200000 IJ SOLN
INTRAMUSCULAR | Status: AC
Start: 2021-12-24 — End: ?
  Filled 2021-12-24: qty 30

## 2021-12-24 MED ORDER — AMISULPRIDE (ANTIEMETIC) 5 MG/2ML IV SOLN
10.0000 mg | Freq: Once | INTRAVENOUS | Status: DC | PRN
Start: 2021-12-24 — End: 2021-12-24

## 2021-12-24 MED ORDER — PHENYLEPHRINE HCL-NACL 20-0.9 MG/250ML-% IV SOLN
INTRAVENOUS | Status: DC | PRN
  Administered 2021-12-24: 20 ug/min via INTRAVENOUS

## 2021-12-24 MED ORDER — ALBUTEROL SULFATE (2.5 MG/3ML) 0.083% IN NEBU
INHALATION_SOLUTION | RESPIRATORY_TRACT | Status: AC
  Administered 2021-12-24: 2.5 mg via RESPIRATORY_TRACT
  Filled 2021-12-24: qty 3

## 2021-12-24 MED ORDER — LACTATED RINGERS IV SOLN: INTRAVENOUS | Status: DC

## 2021-12-24 MED ORDER — OXYCODONE-ACETAMINOPHEN 5-325 MG PO TABS: 1.0000 | ORAL_TABLET | ORAL | 0 refills | Status: DC | PRN

## 2021-12-24 MED ORDER — BUPIVACAINE-EPINEPHRINE 0.25% -1:200000 IJ SOLN
INTRAMUSCULAR | Status: DC | PRN
  Administered 2021-12-24: 3 mL

## 2021-12-24 MED ORDER — FENTANYL CITRATE (PF) 100 MCG/2ML IJ SOLN: 50.0000 ug | Freq: Once | INTRAMUSCULAR | Status: AC

## 2021-12-24 MED ORDER — ONDANSETRON HCL 4 MG/2ML IJ SOLN
INTRAMUSCULAR | Status: AC
  Filled 2021-12-24: qty 2

## 2021-12-24 MED ORDER — FENTANYL CITRATE (PF) 100 MCG/2ML IJ SOLN
INTRAMUSCULAR | Status: AC
  Administered 2021-12-24: 50 ug via INTRAVENOUS
  Filled 2021-12-24: qty 2

## 2021-12-24 MED ORDER — CHLORHEXIDINE GLUCONATE 0.12 % MT SOLN
OROMUCOSAL | Status: AC
  Administered 2021-12-24: 15 mL via OROMUCOSAL
  Filled 2021-12-24: qty 15

## 2021-12-24 MED ORDER — BUPIVACAINE LIPOSOME 1.3 % IJ SUSP
INTRAMUSCULAR | Status: DC | PRN
  Administered 2021-12-24: 10 mL via PERINEURAL

## 2021-12-24 MED ORDER — LIDOCAINE 2% (20 MG/ML) 5 ML SYRINGE
INTRAMUSCULAR | Status: DC | PRN
  Administered 2021-12-24: 40 mg via INTRAVENOUS

## 2021-12-24 MED ORDER — GLYCOPYRROLATE PF 0.2 MG/ML IJ SOSY
PREFILLED_SYRINGE | INTRAMUSCULAR | Status: DC | PRN
  Administered 2021-12-24: .2 mg via INTRAVENOUS

## 2021-12-24 MED ORDER — CHLORHEXIDINE GLUCONATE 0.12 % MT SOLN: 15.0000 mL | Freq: Once | OROMUCOSAL | Status: AC

## 2021-12-24 MED ORDER — OXYCODONE HCL 5 MG/5ML PO SOLN: 5.0000 mg | Freq: Once | ORAL | Status: DC | PRN

## 2021-12-24 MED ORDER — ORAL CARE MOUTH RINSE: 15.0000 mL | Freq: Once | OROMUCOSAL | Status: AC

## 2021-12-24 MED ORDER — ALBUTEROL SULFATE (2.5 MG/3ML) 0.083% IN NEBU: 2.5000 mg | INHALATION_SOLUTION | Freq: Once | RESPIRATORY_TRACT | Status: AC

## 2021-12-24 MED ORDER — DEXAMETHASONE SODIUM PHOSPHATE 10 MG/ML IJ SOLN
INTRAMUSCULAR | Status: AC
  Filled 2021-12-24: qty 1

## 2021-12-24 MED ORDER — MIDAZOLAM HCL 2 MG/2ML IJ SOLN: 1.0000 mg | Freq: Once | INTRAMUSCULAR | Status: AC

## 2021-12-24 MED ORDER — PROPOFOL 10 MG/ML IV BOLUS
INTRAVENOUS | Status: DC | PRN
  Administered 2021-12-24: 100 mg via INTRAVENOUS

## 2021-12-24 MED ORDER — FENTANYL CITRATE (PF) 100 MCG/2ML IJ SOLN: 25.0000 ug | INTRAMUSCULAR | Status: DC | PRN

## 2021-12-24 MED ORDER — MIDAZOLAM HCL 2 MG/2ML IJ SOLN
INTRAMUSCULAR | Status: AC
Start: 2021-12-24 — End: ?
  Filled 2021-12-24: qty 2

## 2021-12-24 MED ORDER — ROCURONIUM BROMIDE 10 MG/ML (PF) SYRINGE
PREFILLED_SYRINGE | INTRAVENOUS | Status: DC | PRN
  Administered 2021-12-24: 60 mg via INTRAVENOUS

## 2021-12-24 MED ORDER — LIDOCAINE 2% (20 MG/ML) 5 ML SYRINGE
INTRAMUSCULAR | Status: AC
  Filled 2021-12-24: qty 5

## 2021-12-24 MED ORDER — PROPOFOL 10 MG/ML IV BOLUS
INTRAVENOUS | Status: AC
  Filled 2021-12-24: qty 20

## 2021-12-24 MED ORDER — DEXAMETHASONE SODIUM PHOSPHATE 10 MG/ML IJ SOLN
INTRAMUSCULAR | Status: DC | PRN
  Administered 2021-12-24: 10 mg via INTRAVENOUS

## 2021-12-24 MED ORDER — BUPIVACAINE HCL (PF) 0.5 % IJ SOLN
INTRAMUSCULAR | Status: DC | PRN
  Administered 2021-12-24: 15 mL via PERINEURAL

## 2021-12-24 MED ORDER — CEFAZOLIN SODIUM-DEXTROSE 2-4 GM/100ML-% IV SOLN
INTRAVENOUS | Status: AC
  Filled 2021-12-24: qty 100

## 2021-12-24 MED ORDER — EPHEDRINE SULFATE-NACL 50-0.9 MG/10ML-% IV SOSY
PREFILLED_SYRINGE | INTRAVENOUS | Status: DC | PRN
  Administered 2021-12-24 (×3): 5 mg via INTRAVENOUS
  Administered 2021-12-24: 2.5 mg via INTRAVENOUS

## 2021-12-24 MED ORDER — MIDAZOLAM HCL 2 MG/2ML IJ SOLN
INTRAMUSCULAR | Status: AC
  Administered 2021-12-24: 1 mg via INTRAVENOUS
  Filled 2021-12-24: qty 2

## 2021-12-24 SURGICAL SUPPLY — 65 items
AID PSTN UNV HD RSTRNT DISP (MISCELLANEOUS) ×1
ANCH SUT SWLK 19.1X4.75 (Anchor) ×1 IMPLANT
ANCH SUT SWLK 19.1X6.25 CLS (Anchor) ×1 IMPLANT
ANCHOR SUT BIO SW 4.75X19.1 (Anchor) ×1 IMPLANT
ANCHOR SUT SWIVELLOK BIO (Anchor) ×1 IMPLANT
APL SKNCLS STERI-STRIP NONHPOA (GAUZE/BANDAGES/DRESSINGS) ×1
BAG COUNTER SPONGE SURGICOUNT (BAG) IMPLANT
BAG SPNG CNTER NS LX DISP (BAG)
BENZOIN TINCTURE PRP APPL 2/3 (GAUZE/BANDAGES/DRESSINGS) ×2 IMPLANT
BLADE EXCALIBUR 4.0X13 (MISCELLANEOUS) ×2 IMPLANT
BLADE SURG 11 STRL SS (BLADE) ×2 IMPLANT
BURR OVAL 8 FLU 4.0X13 (MISCELLANEOUS) IMPLANT
CANNULA ACUFLEX KIT 5X76 (CANNULA) ×2 IMPLANT
CLSR STERI-STRIP ANTIMIC 1/2X4 (GAUZE/BANDAGES/DRESSINGS) ×1 IMPLANT
COVER SURGICAL LIGHT HANDLE (MISCELLANEOUS) ×2 IMPLANT
DRAPE STERI 35X30 U-POUCH (DRAPES) ×2 IMPLANT
DRAPE U-SHAPE 47X51 STRL (DRAPES) ×2 IMPLANT
DRSG PAD ABDOMINAL 8X10 ST (GAUZE/BANDAGES/DRESSINGS) ×6 IMPLANT
DURAPREP 26ML APPLICATOR (WOUND CARE) ×2 IMPLANT
ELECT REM PT RETURN 9FT ADLT (ELECTROSURGICAL) ×2
ELECTRODE REM PT RTRN 9FT ADLT (ELECTROSURGICAL) ×1 IMPLANT
GAUZE PAD ABD 8X10 STRL (GAUZE/BANDAGES/DRESSINGS) ×1 IMPLANT
GAUZE SPONGE 4X4 12PLY STRL (GAUZE/BANDAGES/DRESSINGS) ×2 IMPLANT
GAUZE SPONGE 4X4 12PLY STRL LF (GAUZE/BANDAGES/DRESSINGS) ×1 IMPLANT
GAUZE XEROFORM 1X8 LF (GAUZE/BANDAGES/DRESSINGS) ×2 IMPLANT
GLOVE BIOGEL PI IND STRL 8 (GLOVE) ×2 IMPLANT
GLOVE BIOGEL PI INDICATOR 8 (GLOVE) ×2
GLOVE ORTHO TXT STRL SZ7.5 (GLOVE) ×4 IMPLANT
GOWN STRL REUS W/ TWL LRG LVL3 (GOWN DISPOSABLE) ×1 IMPLANT
GOWN STRL REUS W/ TWL XL LVL3 (GOWN DISPOSABLE) ×1 IMPLANT
GOWN STRL REUS W/TWL 2XL LVL3 (GOWN DISPOSABLE) ×2 IMPLANT
GOWN STRL REUS W/TWL LRG LVL3 (GOWN DISPOSABLE) ×2
GOWN STRL REUS W/TWL XL LVL3 (GOWN DISPOSABLE) ×2
KIT BASIN OR (CUSTOM PROCEDURE TRAY) ×2 IMPLANT
KIT BIO-TENODESIS 3X8 DISP (MISCELLANEOUS) ×2
KIT INSRT BABSR STRL DISP BTN (MISCELLANEOUS) IMPLANT
KIT TURNOVER KIT B (KITS) ×2 IMPLANT
MANIFOLD NEPTUNE II (INSTRUMENTS) ×2 IMPLANT
NDL HYPO 25X1 1.5 SAFETY (NEEDLE) ×1 IMPLANT
NDL SPNL 18GX3.5 QUINCKE PK (NEEDLE) ×1 IMPLANT
NDL SUT 6 .5 CRC .975X.05 MAYO (NEEDLE) IMPLANT
NEEDLE HYPO 25X1 1.5 SAFETY (NEEDLE) ×2 IMPLANT
NEEDLE MAYO TAPER (NEEDLE)
NEEDLE SPNL 18GX3.5 QUINCKE PK (NEEDLE) ×2 IMPLANT
NS IRRIG 1000ML POUR BTL (IV SOLUTION) ×2 IMPLANT
PACK SHOULDER (CUSTOM PROCEDURE TRAY) ×2 IMPLANT
PAD ARMBOARD 7.5X6 YLW CONV (MISCELLANEOUS) ×4 IMPLANT
PORT APPOLLO RF 90DEGREE MULTI (SURGICAL WAND) IMPLANT
RESTRAINT HEAD UNIVERSAL NS (MISCELLANEOUS) ×2 IMPLANT
SLING ARM IMMOBILIZER LRG (SOFTGOODS) IMPLANT
SPONGE T-LAP 4X18 ~~LOC~~+RFID (SPONGE) ×4 IMPLANT
STRIP CLOSURE SKIN 1/2X4 (GAUZE/BANDAGES/DRESSINGS) ×2 IMPLANT
SUCTION FRAZIER HANDLE 10FR (MISCELLANEOUS) ×2
SUCTION TUBE FRAZIER 10FR DISP (MISCELLANEOUS) ×1 IMPLANT
SUT ETHILON 4 0 PS 2 18 (SUTURE) ×2 IMPLANT
SUT VIC AB 0 CT2 27 (SUTURE) IMPLANT
SUT VIC AB 2-0 CT1 27 (SUTURE) ×2
SUT VIC AB 2-0 CT1 TAPERPNT 27 (SUTURE) ×1 IMPLANT
SUT VICRYL 4-0 PS2 18IN ABS (SUTURE) ×2 IMPLANT
SYR CONTROL 10ML LL (SYRINGE) ×2 IMPLANT
TAPE CLOTH 4X10 WHT NS (GAUZE/BANDAGES/DRESSINGS) ×1 IMPLANT
TOWEL GREEN STERILE (TOWEL DISPOSABLE) ×2 IMPLANT
TOWEL GREEN STERILE FF (TOWEL DISPOSABLE) ×2 IMPLANT
TUBE CONNECTING 12X1/4 (SUCTIONS) ×2 IMPLANT
TUBING ARTHROSCOPY IRRIG 16FT (MISCELLANEOUS) ×2 IMPLANT

## 2021-12-24 NOTE — Op Note (Addendum)
Preop diagnosis :Right shoulder adhesive capsulitis biceps tenodesis, right supraspinatus partial-thickness rotator cuff tear.  Postop diagnosis: Right shoulder adhesive capsulitis , SLAP tear with biceps tendinosis, full-thickness rotator cuff tear.  Procedure: Right shoulder arthroscopy, exam under anesthesia.  Shoulder arthroscopic debridement and manipulation under general anesthesia.  Labral debridement biceps tendon release and open supraspinatus full-thickness repair.  Open biceps tenodesis.  Surgeon: Rodell Perna, MD  Assistant: Benjiman Core, PA-C medically necessary and present for the entire procedure.  Medically necessary for adhesions capsular contraction and assistance in positioning of the shoulder Intra-Op for above described repairs.  Anesthesia preoperative block plus General orotracheal +4 cc Marcaine local.  Brief history: Patient to follow-up since February with persistent pain in her shoulder she has medical problems had pulmonary problems history of COPD and had to be cleared by both cardiology and a pulmonary doctor before she could proceed with surgery.  She has had adhesive capsulitis decreased range of motion MRI showed partial-thickness tearing of all 4 components of the rotator cuff but no full-thickness tear.  (Intraoperatively full-thickness tear was found) MRI scan showed SLAP tear biceps intra-articular tendinopathy.  Patient's failed injection treatment attempts at therapy and had had persistent decreased range of motion of her shoulder which is painful with abduction and flexion only to 80 to 90 degrees.  Procedure: After induction of preoperative block general anesthesia orotracheal ovation patient's in a beachchair position careful positioning.  Arm was prepped with DuraPrep Ancef prophylaxis prepping down to the wrist.  Usual arthroscopic sheets drapes split sheets and arthroscopic patch was applied with impervious stockinette Coban.  Timeout procedure was completed.   Arthroscope was introduced from posterior approach and shoulder was inspected there was erythema of the capsule with capsular contraction and a superior labral tear that extended from 10:00 to 2:00.  Complex superior labral tear and biceps tendinosis was present.  Using the ArthroCare wand the biceps tendon was released and then the shaver was used for some debridement of the anterior labrum.  Shoulder was gently abducted adhesions popped and her arm was brought up directly overhead she was limited in internal rotation with hand just posterior axillary line but the arm would flex easily overhead as well as abduct easily overhead.  Scope was then reintroduced continue debridement of scar tissue bands was performed.  Biceps tendon was completely released and undersurface of rotator cuff had a full-thickness tear involving the anterior portion of the supraspinatus.  Shoulder was suctioned dry small incision was made along the anterior chromium 2 cm deltoid was peeled off wheat Lander retractor placed some acromial bursectomy was performed after three-quarter8 osteotome was used to remove anterior spur off the acromion.  Undersurface was smoothed.  Full-thickness rotator cuff tear was visualized and pickups could be placed through the hole directly in the shoulder joint there was egress of fluid out.  The rotator cuff was cut back a few millimeters and then a right angle clamp was used with shoulder flexed elbow flexed held by Benjiman Core, PA-C in appropriate position and the biceps tendon was hooked with the right angle retractor delivered tagged with a 2-0 FiberWire temporarily resected 22 mm and then a #2 FiberWire was placed in a Bennell weave down to the tendon and then back out with 1 and longer than the other.  This was passed through the disposable screwdriver with the Bio-Tenodesis screw.  Drill was placed directly under visualization the bicipital groove drilled overreamed to 7 mm and then the tendon and the  6.2  five screw were inserted twisted completely down direct inspection so the tendon went in the hole the 2 ends of the suture were then tied down together locking it in and cut.  Elbow reach full extension.  Next a swivel lock anchor was placed into the greater tuberosity after proper position freshening up the bone adjacent to the area at the edge of the articular surface and then punching it and to the second line on the punch and then insertion of the swivel lock after making sure the rotator cuff was pulled securely to the area.  2 ends of the FiberWire suture were passed through the rotator cuff pulling down anatomically and then tied over the top laterally in an V-shaped fashion to lateral tissue.  Shoulder was completely and carefully rotated inspected there were no other tear of the rotator cuff.  Supers spinatus was intact and repaired.  Deltoid was repaired with empty needle using the remaining FiberWire suture through the deltoid muscle and passing it directly through bone with empty free needle.  6 mm split the deltoid was repaired side to side loosely did not crush the muscle 2-0 Vicryl subtenons tissue 3-0 Vicryl subcuticular closure tincture benzoin Steri-Strips.  3-0 nylon in the anterior posterior shoulder arthroscopic portal postop dressing and shoulder immobilizer.  Patient tolerated procedure well and was discharged home.  DIAGNOSES: Right shoulder, chronic rotator cuff tear, SLAP tear, biceps tendinitis, and subacromial impingement.  POST-OPERATIVE DIAGNOSIS: same  PROCEDURE: Open repair chronic rotator cuff tear - 23412 Arthroscopic biceps tenodesis - 45364   OPERATIVE FINDING: Exam under anesthesia:  frozen shoulder Articular space:  f Chondral surfaces: Normal Biceps:  read above Subscapularis: Intact intact Supraspinatus: Complete tear   Infraspinatus: Intact intact

## 2021-12-24 NOTE — Interval H&P Note (Signed)
History and Physical Interval Note:  12/24/2021 3:24 PM  [name] [name]  has presented today for surgery, with the diagnosis of right shoulder rotator cuff tear, biceps tendinosis, superior labrum anterior posterior tear.  The various methods of treatment have been discussed with the patient and family. After consideration of risks, benefits and other options for treatment, the patient has consented to  Procedure(s): RIGHT SHOULDER ARTHROSCOPY WITH ROTATOR CUFF REPAIR AND BICEPS TENODESIS (Right) as a surgical intervention.  The patient's history has been reviewed, patient examined, no change in status, stable for surgery.  I have reviewed the patient's chart and labs.  Questions were answered to the patient's satisfaction.     Marybelle Killings

## 2021-12-24 NOTE — Anesthesia Procedure Notes (Signed)
Procedure Name: Intubation Date/Time: 12/24/2021 3:41 PM  Performed by: Betha Loa, CRNAPre-anesthesia Checklist: Patient identified, Emergency Drugs available, Suction available and Patient being monitored Patient Re-evaluated:Patient Re-evaluated prior to induction Oxygen Delivery Method: Circle System Utilized Preoxygenation: Pre-oxygenation with 100% oxygen Induction Type: IV induction Ventilation: Mask ventilation without difficulty Laryngoscope Size: Mac and 3 Grade View: Grade I Tube type: Oral Tube size: 7.0 mm Number of attempts: 1 Airway Equipment and Method: Stylet and Oral airway Placement Confirmation: ETT inserted through vocal cords under direct vision, positive ETCO2 and breath sounds checked- equal and bilateral Secured at: 21 cm Tube secured with: Tape Dental Injury: Teeth and Oropharynx as per pre-operative assessment

## 2021-12-24 NOTE — Interval H&P Note (Signed)
History and Physical Interval Note:  12/24/2021 3:29 PM  [name] [name]  has presented today for surgery, with the diagnosis of right shoulder rotator cuff tear, biceps tendinosis, superior labrum anterior posterior tear.  The various methods of treatment have been discussed with the patient and family. After consideration of risks, benefits and other options for treatment, the patient has consented to  Procedure(s): RIGHT SHOULDER ARTHROSCOPY WITH ROTATOR CUFF REPAIR AND BICEPS TENODESIS (Right) as a surgical intervention.  The patient's history has been reviewed, patient examined, no change in status, stable for surgery.  I have reviewed the patient's chart and labs.  Questions were answered to the patient's satisfaction.     Marybelle Killings

## 2021-12-24 NOTE — Discharge Instructions (Addendum)
Per Dr. Lorin Mercy okay to shower 24 hours postop without shoulder immobilizer.  Must keep elbow down and front of your body.  Shoulder immobilizer must be on at all times.    Do not move arm.  Ice off an on as needed.    No lifting, pushing, pulling with right arm  No driving   Call office Tuesday so we can set up therapy to prevent frozen shoulder problems . Leave message and Dr. Lorin Mercy assistant will call you back.

## 2021-12-24 NOTE — Anesthesia Postprocedure Evaluation (Signed)
Anesthesia Post Note  Patient: RHEBA DIAMOND  Procedure(s) Performed: RIGHT SHOULDER ARTHROSCOPY WITH ROTATOR CUFF REPAIR AND BICEPS TENODESIS (Right: Shoulder)     Patient location during evaluation: PACU Anesthesia Type: General Level of consciousness: awake and alert and oriented Pain management: pain level controlled Vital Signs Assessment: post-procedure vital signs reviewed and stable Respiratory status: spontaneous breathing, nonlabored ventilation and respiratory function stable Cardiovascular status: blood pressure returned to baseline and stable Postop Assessment: no apparent nausea or vomiting Anesthetic complications: no   No notable events documented.  Last Vitals:  Vitals:   12/24/21 1430 12/24/21 1435  BP: (!) 161/68 (!) 163/64  Pulse: (!) 52 (!) 54  Resp: 17 18  Temp:    SpO2: 98% 99%    Last Pain:  Vitals:   12/24/21 1659  TempSrc:   PainSc: 0-No pain                 Chandon Lazcano A.

## 2021-12-24 NOTE — H&P (Signed)
[name] [name] is an 66 y.o. female.   Chief Complaint: Painful right shoulder with partial rotator cuff tear decreased range of motion and biceps tendinopathy HPI: 66 year old female with symptoms times greater than 4 months.  MRI scan shows rotator cuff tendinosis partial tear but no full-thickness tear supraspinatus.  Tendinopathy of the remaining portion of the rotator cuff and also intra-articular biceps tendon.  She has had injections therapy, anti-inflammatory medications without relief.  Past Medical History:  Diagnosis Date   Allergy    seasonal   Anemia    Anxiety    takes Lexapro daily   Arthritis    "back, from neck down pass my bra area" (03/25/2017)   Asthma    Bartholin gland cyst 08/29/2011   Bruises easily    pt is on Effient   Chronic back pain    herniated nucleus pulposus   Chronic back pain    "neck to bra area; lower back" (03/25/2017)   Chronic kidney disease    recurrent UTI's this year 2022   COPD (chronic obstructive pulmonary disease) (HCC)    early stages   Coronary artery disease    Depression    takes Klonopin daily   Diabetes mellitus without complication ([place])    Diverticulosis    Fibroadenoma of left breast    GERD (gastroesophageal reflux disease)    takes Nexium daily   H/O hiatal hernia    Heart attack ([place]) 2011   Hemorrhoids    Hernia    Hyperlipidemia    takes Lipitor daily   Hypertension    takes Losartan daily and Labetalol bid   Hypothyroidism    takes Synthroid daily   Insomnia    hydroxyzine prn   Joint pain    Pneumonia    "couple times" (03/25/2017)   Pre-diabetes    "just found out 1 wk ago" (03/25/2017)   Psoriasis    elbows,knees,back   Shortness of breath    with exertion   Slowing of urinary stream    Stress incontinence     Past Surgical History:  Procedure Laterality Date   [place]   "left one of my ovaries"   BACK SURGERY     BIOPSY  05/27/2019    Procedure: BIOPSY;  Surgeon: Daneil Dolin, MD;  Location: [facility];  Service: Endoscopy;;   COLONOSCOPY  2011   Dr. Ardis Hughs: Mild diverticulosis, descending diminutive colon polyp (not retrieved), next colonoscopy 10 years   CORONARY ANGIOPLASTY WITH STENT PLACEMENT  2011 X2   "regular stents didn't work; had to go back in in ~ 1 month and put in medicated stents"   DILATION AND CURETTAGE OF UTERUS     ESOPHAGOGASTRODUODENOSCOPY     ESOPHAGOGASTRODUODENOSCOPY (EGD) WITH PROPOFOL N/A 05/27/2019   normal esophagus, dilation, erosive gastropathy s/p biopsy, normal duodenum. Negative H.pylori.    LEFT HEART CATH AND CORONARY ANGIOGRAPHY N/A 03/26/2017   Procedure: LEFT HEART CATH AND CORONARY ANGIOGRAPHY;  Surgeon: Belva Crome, MD;  Location: [facility];  Service: Cardiovascular;  Laterality: N/A;   LUMBAR LAMINECTOMY/DECOMPRESSION MICRODISCECTOMY  08/05/2011   Procedure: LUMBAR LAMINECTOMY/DECOMPRESSION MICRODISCECTOMY;  Surgeon: Otilio Connors, MD;  Location: [facility];  Service: Neurosurgery;  Laterality: Right;  Right Lumbar four-five extraforaminal discectomy   MALONEY DILATION N/A 05/27/2019   Procedure: Venia Minks DILATION;  Surgeon: Daneil Dolin, MD;  Location: [facility];  Service: Endoscopy;  Laterality: N/A;  52   TONSILLECTOMY     as a child   UPPER GASTROINTESTINAL ENDOSCOPY      Family History  Problem Relation Age of Onset   Heart disease Mother    Hypertension Mother    Stroke Mother    Mental illness Mother    Heart disease Father    Hypertension Father    Diabetes Father    Colon polyps Brother    Breast cancer Maternal Aunt    Throat cancer Maternal Uncle    Thyroid disease Paternal Aunt    Heart disease Maternal Grandfather    Cancer Paternal Grandmother        mouth   Anesthesia problems Daughter    Hypotension Neg Hx    Malignant hyperthermia Neg Hx    Pseudochol deficiency Neg Hx    Colon cancer Neg Hx    Stomach cancer Neg Hx     Esophageal cancer Neg Hx    Pancreatic cancer Neg Hx    Rectal cancer Neg Hx    Social History:  reports that she has been smoking cigarettes. She has a 25.00 pack-year smoking history. She has never used smokeless tobacco. She reports current alcohol use. She reports current drug use. Drug: Marijuana.  Allergies:  Allergies  Allergen Reactions   Avocado Anaphylaxis   Latex Shortness Of Breath and Rash   Codeine Nausea Only    Medications Prior to Admission  Medication Sig Dispense Refill   acetaminophen (TYLENOL) 500 MG tablet Take 1,000 mg by mouth every 6 (six) hours as needed for moderate pain.     albuterol (VENTOLIN HFA) 108 (90 Base) MCG/ACT inhaler INHALE 2 PUFFS INTO THE LUNGS EVERY 4-6 HOURS AS NEEDED FOR WHEEZING OR SHORTNESS OF BREATH. 18 g 2   amLODipine (NORVASC) 5 MG tablet TAKE 1 TABLET (5 MG TOTAL) BY MOUTH DAILY. 90 tablet 1   aspirin EC 81 MG tablet Take 81 mg by mouth daily.     busPIRone (BUSPAR) 15 MG tablet TAKE 2 TABLETS IN THE MORNING, 1 TAB AT NOON AND 1 TAB IN THE EVENING (Patient taking differently: Take 60 mg by mouth at bedtime.) 120 tablet 3   Chlorpheniramine-DM (COUGH & COLD HBP PO) Take 1 tablet by mouth daily as needed (congestion).     clonazePAM (KLONOPIN) 0.5 MG tablet Take 1 tablet (0.5 mg total) by mouth 2 (two) times daily as needed for anxiety. 60 tablet 2   doxepin (SINEQUAN) 75 MG capsule Take 1 capsule (75 mg total) by mouth at bedtime. 30 capsule 3   escitalopram (LEXAPRO) 10 MG tablet TAKE 3 TABLETS (30 MG TOTAL) BY MOUTH DAILY. 90 tablet 3   ferrous sulfate 325 (65 FE) MG tablet Take 1 tablet (325 mg total) by mouth daily with breakfast. 100 tablet 1   Fluticasone-Umeclidin-Vilant (TRELEGY ELLIPTA) 100-62.5-25 MCG/ACT AEPB Inhale 1 puff into the lungs daily 60 each 2   furosemide (LASIX) 20 MG tablet Take 1 tablet (20 mg total) by mouth daily. 30 tablet 2   gabapentin (NEURONTIN) 600 MG tablet TAKE 1 TABLET BY MOUTH 4 TIMES DAILY 360 tablet  1   ipratropium-albuterol (DUONEB) 0.5-2.5 (3) MG/3ML SOLN TAKE 3 MLS VIA NEBULIZATION EVERY 6 HOURS (Patient taking differently: Take 3 mLs by nebulization every 6 (six) hours as needed (shortness of breath or wheezing).) 360 mL 1   levothyroxine (SYNTHROID) 112 MCG tablet Take 1 tablet (112 mcg total) by mouth daily. 30 tablet 6   loperamide (IMODIUM A-D) 2 MG tablet  Take 4 mg by mouth 4 (four) times daily as needed for diarrhea or loose stools.     losartan (COZAAR) 50 MG tablet TAKE 1 & 1/2 TABLETS BY MOUTH DAILY. 45 tablet 2   metFORMIN (GLUCOPHAGE) 500 MG tablet TAKE 1 TABLET (500 MG TOTAL) BY MOUTH 2 (TWO) TIMES DAILY WITH A MEAL. 60 tablet 3   methocarbamol (ROBAXIN) 500 MG tablet Take 1 tablet (500 mg total) by mouth 2 (two) times daily as needed for muscle spasms. 60 tablet 1   mirtazapine (REMERON) 30 MG tablet TAKE 1 TABLET (30 MG TOTAL) BY MOUTH AT BEDTIME. 30 tablet 3   montelukast (SINGULAIR) 10 MG tablet TAKE 1 TABLET BY MOUTH AT BEDTIME. 30 tablet 2   nebivolol (BYSTOLIC) 10 MG tablet TAKE 1 TABLET (10 MG TOTAL) BY MOUTH DAILY. 90 tablet 1   nicotine (NICODERM CQ - DOSED IN MG/24 HOURS) 21 mg/24hr patch Place 1 patch (21 mg total) onto the skin daily. 28 patch 1   nicotine polacrilex (NICOTINE MINI) 4 MG lozenge Use three times a day as needed to stop smoking 100 tablet 0   nitroGLYCERIN (NITROSTAT) 0.4 MG SL tablet Place 1 tablet (0.4 mg total) under the tongue every 5 (five) minutes as needed for chest pain. 25 tablet 3   omeprazole (PRILOSEC) 40 MG capsule TAKE 1 CAPSULE (40 MG TOTAL) BY MOUTH 2 (TWO) TIMES DAILY BEFORE A MEAL. 60 capsule 2   sodium chloride (OCEAN) 0.65 % SOLN nasal spray Place 1 spray into both nostrils as needed for congestion.     ticagrelor (BRILINTA) 60 MG TABS tablet TAKE 1 TABLET (60 MG TOTAL) BY MOUTH 2 (TWO) TIMES DAILY. 60 tablet 5   traMADol (ULTRAM) 50 MG tablet Take 1 tablet (50 mg total) by mouth every 12 (twelve) hours as needed. 60 tablet 1    atorvastatin (LIPITOR) 80 MG tablet Take 1 tablet (80 mg total) by mouth daily. please schedule appointment for more refills 30 tablet 0   Blood Glucose Monitoring Suppl (TRUE METRIX METER) DEVI 1 kit by Does not apply route 4 (four) times daily. 1 Device 0   glucose blood (TRUE METRIX BLOOD GLUCOSE TEST) test strip Use as instructed 100 each 12   TRUEPLUS LANCETS 28G MISC 28 g by Does not apply route QID. 120 each 2    No results found. However, due to the size of the patient record, not all encounters were searched. Please check Results Review for a complete set of results. No results found.  Review of Systems all the systems reviewed contributory to HPI.  She has been cleared by both pulmonary and cardiology as well as her medical doctor.  Blood pressure (!) 163/64, pulse (!) 54, temperature 97.6 F (36.4 C), temperature source Oral, resp. rate 18, height '5\' 1"'  (1.549 m), weight 64.4 kg, SpO2 99 %. Physical Exam Constitutional:      Appearance: Normal appearance.  HENT:     Head: Normocephalic.     Right Ear: External ear normal.     Left Ear: External ear normal.     Nose: Nose normal.     Mouth/Throat:     Mouth: Mucous membranes are moist.  Eyes:     Extraocular Movements: Extraocular movements intact.  Cardiovascular:     Rate and Rhythm: Normal rate.     Pulses: Normal pulses.  Pulmonary:     Effort: Pulmonary effort is normal.  Abdominal:     Palpations: Abdomen is soft.  Musculoskeletal:  Cervical back: Normal range of motion.  Neurological:     General: No focal deficit present.     Mental Status: She is alert.  Psychiatric:        Mood and Affect: Mood normal.        Behavior: Behavior normal.        Thought Content: Thought content normal.   Abduction right shoulder limited to 90 degrees.  Significant tenderness over the right long head of the biceps in the bicipital groove.  Flexion limited to 90 degrees with pain.  Assessment/Plan As previously planned  right shoulder arthroscopy with rotator cuff repair if needed.  Biceps tenodesis if needed and manipulation under anesthesia partial synovectomy as needed.  Questions were elicited and answered  Marybelle Killings, MD 12/24/2021, 3:26 PM

## 2021-12-24 NOTE — Transfer of Care (Signed)
Immediate Anesthesia Transfer of Care Note  Patient: [name] [name]  Procedure(s) Performed: RIGHT SHOULDER ARTHROSCOPY WITH ROTATOR CUFF REPAIR AND BICEPS TENODESIS (Right: Shoulder)  Patient Location: PACU  Anesthesia Type:General and Regional  Level of Consciousness: awake, alert , oriented, patient cooperative and responds to stimulation  Airway & Oxygen Therapy: Patient Spontanous Breathing and Patient connected to nasal cannula oxygen  Post-op Assessment: Report given to RN and Post -op Vital signs reviewed and stable  Post vital signs: Reviewed and stable  Last Vitals:  Vitals Value Taken Time  BP 153/72 12/24/21 1700  Temp    Pulse 66 12/24/21 1702  Resp 17 12/24/21 1702  SpO2 98 % 12/24/21 1702  Vitals shown include unvalidated device data.  Last Pain:  Vitals:   12/24/21 1312  TempSrc: Oral         Complications: No notable events documented.

## 2021-12-24 NOTE — Anesthesia Procedure Notes (Signed)
Anesthesia Regional Block: Interscalene brachial plexus block   Pre-Anesthetic Checklist: , timeout performed,  Correct Patient, Correct Site, Correct Laterality,  Correct Procedure, Correct Position, site marked,  Risks and benefits discussed,  Surgical consent,  Pre-op evaluation,  At surgeon's request and post-op pain management  Laterality: Right  Prep: chloraprep       Needles:  Injection technique: Single-shot  Needle Type: Echogenic Stimulator Needle     Needle Length: 10cm  Needle Gauge: 21   Needle insertion depth: 6 cm   Additional Needles:   Procedures:,,,, ultrasound used (permanent image in chart),,    Narrative:  Start time: 12/24/2021 2:28 PM End time: 12/24/2021 2:33 PM Injection made incrementally with aspirations every 5 mL.  Performed by: Personally  Anesthesiologist: Josephine Igo, MD  Additional Notes: Timeout performed. Patient sedated. Relevant anatomy ID'd using [place]. Incremental 2-48m injection of LA with frequent aspiration. Patient tolerated procedure well.     Right Interscalene Block

## 2021-12-24 NOTE — H&P (Signed)
  66 year old white female with history of right shoulder impingement, rotator cuff tear and biceps tendinosis comes in for preop evaluation.  States that symptoms unchanged from previous visit and she is wanting to proceed with right shoulder arthroscopy with rotator cuff repair and possible biceps tenodesis.  Today history and physical performed.  I reviewed patient's chart.  Primary care provider Dr. Karle Plumber has cleared patient from a medical and cardiac standpoint and was seen by her September 14, 2021.  Patient was seen in the emergency room September 25, 2021 for COPD exacerbation but has not been back to see her PCP.  Patient also has history of coronary artery disease with stent placement.  She was last seen by cardiologist Dr. Shelva Majestic February 10, 2020.  In that note he stated that he wanted to see patient back in 12 months but she did not return.  Patient stated that she understood that he did not want to see her back and she was released.  Patient also followed by Dr. Joya Gaskins for pulmonary issues.  Today she admits to having ongoing chest congestion and productive cough with white sputum.  States that this is her normal.  She completed antibiotics and prednisone taper that was given at the emergency department.     Plan On exam patient does have diminished breath sounds bilaterally.  Few scattered wheezes.  I spoke with our surgery scheduler who did talk to Harrington PA at the hospital.  Anesthesiologist recommended postponing patient's surgery.  I think this is a good idea.  Patient needs clearances from cardiology and pulmonary for rescheduling.    Addendum: Patient last seen by me 10 October 2021 in clinic.

## 2021-12-25 ENCOUNTER — Encounter (HOSPITAL_COMMUNITY): Payer: Self-pay | Admitting: Orthopaedic Surgery

## 2021-12-25 ENCOUNTER — Telehealth: Payer: Self-pay | Admitting: Orthopaedic Surgery

## 2021-12-25 DIAGNOSIS — M7581 Other shoulder lesions, right shoulder: Secondary | ICD-10-CM

## 2021-12-25 NOTE — Telephone Encounter (Signed)
Order placed in chart.

## 2021-12-25 NOTE — Telephone Encounter (Signed)
Can you please get them scheduled asap please

## 2021-12-25 NOTE — Telephone Encounter (Signed)
Patient's daughter called. Says Dr. Lorin Mercy wanted her mom to start PT ASAP. Need a referral for outpatient PT for [facility].

## 2021-12-25 NOTE — Telephone Encounter (Signed)
Patient daughter called in because patient has frozen shoulder and she is supposed to have physical therapy but there is no order in there please advise, call [name] at [phone]

## 2021-12-26 ENCOUNTER — Other Ambulatory Visit: Payer: Self-pay

## 2021-12-26 ENCOUNTER — Encounter: Payer: Self-pay | Admitting: Rehabilitative and Restorative Service Providers"

## 2021-12-26 ENCOUNTER — Ambulatory Visit (INDEPENDENT_AMBULATORY_CARE_PROVIDER_SITE_OTHER): Payer: Medicare Other | Admitting: Rehabilitative and Restorative Service Providers"

## 2021-12-26 DIAGNOSIS — M25511 Pain in right shoulder: Secondary | ICD-10-CM

## 2021-12-26 DIAGNOSIS — M25611 Stiffness of right shoulder, not elsewhere classified: Secondary | ICD-10-CM | POA: Diagnosis not present

## 2021-12-26 DIAGNOSIS — M6281 Muscle weakness (generalized): Secondary | ICD-10-CM | POA: Diagnosis not present

## 2021-12-26 DIAGNOSIS — R600 Localized edema: Secondary | ICD-10-CM

## 2021-12-26 DIAGNOSIS — G8929 Other chronic pain: Secondary | ICD-10-CM

## 2021-12-26 NOTE — Therapy (Signed)
OUTPATIENT PHYSICAL THERAPY EVALUATION   Patient Name: [name] [name] MRN: [id number] DOB:04-04-1956, 66 y.o., female Today's Date: 12/26/2021   PT End of Session - 12/26/21 1303     Visit Number 1    Number of Visits 20    Date for PT Re-Evaluation 03/06/22    Authorization Type Medicare    Progress Note Due on Visit 10    PT Start Time 1312    PT Stop Time 1344    PT Time Calculation (min) 32 min    Activity Tolerance Patient limited by pain    Behavior During Therapy [facility] for tasks assessed/performed             Past Medical History:  Diagnosis Date   Allergy    seasonal   Anemia    Anxiety    takes Lexapro daily   Arthritis    "back, from neck down pass my bra area" (03/25/2017)   Asthma    Bartholin gland cyst 08/29/2011   Bruises easily    pt is on Effient   Chronic back pain    herniated nucleus pulposus   Chronic back pain    "neck to bra area; lower back" (03/25/2017)   Chronic kidney disease    recurrent UTI's this year 2022   COPD (chronic obstructive pulmonary disease) (HCC)    early stages   Coronary artery disease    Depression    takes Klonopin daily   Diabetes mellitus without complication ([place])    Diverticulosis    Fibroadenoma of left breast    GERD (gastroesophageal reflux disease)    takes Nexium daily   H/O hiatal hernia    Heart attack ([place]) 2011   Hemorrhoids    Hernia    Hyperlipidemia    takes Lipitor daily   Hypertension    takes Losartan daily and Labetalol bid   Hypothyroidism    takes Synthroid daily   Insomnia    hydroxyzine prn   Joint pain    Pneumonia    "couple times" (03/25/2017)   Pre-diabetes    "just found out 1 wk ago" (03/25/2017)   Psoriasis    elbows,knees,back   Shortness of breath    with exertion   Slowing of urinary stream    Stress incontinence    Past Surgical History:  Procedure Laterality Date   [place]   "left one of my ovaries"    BACK SURGERY     BIOPSY  05/27/2019   Procedure: BIOPSY;  Surgeon: Daneil Dolin, MD;  [facility];  Service: Endoscopy;;   COLONOSCOPY  2011   Dr. Ardis Hughs: Mild diverticulosis, descending diminutive colon polyp (not retrieved), next colonoscopy 10 years   CORONARY ANGIOPLASTY WITH STENT PLACEMENT  2011 X2   "regular stents didn't work; had to go back in in ~ 1 month and put in medicated stents"   DILATION AND CURETTAGE OF UTERUS     ESOPHAGOGASTRODUODENOSCOPY     ESOPHAGOGASTRODUODENOSCOPY (EGD) WITH PROPOFOL N/A 05/27/2019   normal esophagus, dilation, erosive gastropathy s/p biopsy, normal duodenum. Negative H.pylori.    LEFT HEART CATH AND CORONARY ANGIOGRAPHY N/A 03/26/2017   Procedure: LEFT HEART CATH AND CORONARY ANGIOGRAPHY;  Surgeon: Belva Crome, MD;  Location: [facility];  Service: Cardiovascular;  Laterality: N/A;   LUMBAR LAMINECTOMY/DECOMPRESSION MICRODISCECTOMY  08/05/2011   Procedure: LUMBAR LAMINECTOMY/DECOMPRESSION MICRODISCECTOMY;  Surgeon: Otilio Connors, MD;  Location: [place]  [facility];  Service: Neurosurgery;  Laterality: Right;  Right Lumbar four-five extraforaminal discectomy   MALONEY DILATION N/A 05/27/2019   Procedure: Venia Minks DILATION;  Surgeon: Daneil Dolin, MD;  [facility];  Service: Endoscopy;  Laterality: N/A;  54   SHOULDER ARTHROSCOPY WITH ROTATOR CUFF REPAIR AND OPEN BICEPS TENODESIS Right 12/24/2021   Procedure: RIGHT SHOULDER ARTHROSCOPY WITH ROTATOR CUFF REPAIR AND BICEPS TENODESIS;  Surgeon: Marybelle Killings, MD;  Location: [facility];  Service: Orthopedics;  Laterality: Right;   TONSILLECTOMY     as a child   UPPER GASTROINTESTINAL ENDOSCOPY     Patient Active Problem List   Diagnosis Date Noted   Nontraumatic complete tear of right rotator cuff    Superior glenoid labrum lesion of left shoulder    Iron deficiency anemia 11/02/2021   Major depressive disorder, recurrent, in full remission with anxious distress ([place])  05/31/2021   Rotator cuff tendinitis, right 05/08/2021   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   Chronic neck pain 02/26/2021   GAD (generalized anxiety disorder) 01/08/2021   Acute cystitis without hematuria 11/07/2020   Influenza vaccine needed 06/15/2020   Abdominal pain, epigastric 04/28/2019   Esophageal dysphagia 04/28/2019   Bipolar I disorder ([place]) 09/17/2018   Insomnia 10/28/2017   Prediabetes 05/28/2017   QT prolongation 03/25/2017   Encounter for screening mammogram for breast cancer 09/11/2015   Psoriasis 06/22/2015   COPD (chronic obstructive pulmonary disease) (HCC)GOLD E 05/18/2015   Anxiety and depression 07/10/2013   Essential hypertension, benign 06/07/2013   Dyslipidemia 06/07/2013   Asthma, chronic 06/07/2013   Emphysema lung ([place]) 06/07/2013   Chronic back pain 06/07/2013   CAD S/P percutaneous coronary angioplasty 06/07/2013   GERD (gastroesophageal reflux disease) 06/07/2013   Breast lump on left side at 3 o'clock position 10/13/2012   S/P abdominal hysterectomy and right salpingo-oophorectomy 08/29/2011   TOBACCO ABUSE 07/17/2009   Chronic rhinitis 07/17/2009   Lung nodule < 6cm on CT 07/17/2009   ALLERGY, FOOD 07/17/2009   Hypothyroidism 12/22/2008    PCP: Troy Sine MD  REFERRING PROVIDER: Marybelle Killings, MD  REFERRING DIAG: M75.81 (ICD-10-CM) - Rotator cuff tendinitis, right  THERAPY DIAG:  Chronic right shoulder pain  Muscle weakness (generalized)  Stiffness of right shoulder, not elsewhere classified  Localized edema  Rationale for Evaluation and Treatment Rehabilitation  ONSET DATE: Surgery 12/24/2021  SUBJECTIVE:                                                                                                                                                                                      SUBJECTIVE STATEMENT: Pt indicated trouble for about year prior  to recent surgery, insidious onset of symptoms.  Rotator cuff  repair c manipulation and bicep tenodesis.  Pt indicated hurting consistently at this time.  Pt arrived c sling on Rt arm.  Pt indicated using chair and bed.  Difficulty with sleeping noted.   PERTINENT HISTORY: Chronic back pain, CKD, COPD, CAD, depression, DM, GERD  PAIN:  NPRS scale: at worst 10/10, currently 9/10 Pain location: Rt shoulder Pain description: constant throbbing, burning.  Aggravating factors: all movement  Relieving factors: medicine   PRECAUTIONS: Shoulder - Rotator cuff repair (Passive range at this time)  WEIGHT BEARING RESTRICTIONS Yes shoulder  FALLS:  Has patient fallen in last 6 months? 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   OBJECTIVE:   PATIENT SURVEYS:   12/26/2021: FOTO intake:  20  predicted:  52  COGNITION: 12/26/2021 Overall cognitive status: Within functional limits for tasks assessed     SENSATION: 12/26/2021 WFL  POSTURE: 12/26/2021 Rounded shoulder, forward head posture.  Rt arm in sling upon arrival   Localized edema Rt shoulder visibly   UPPER EXTREMITY ROM:   ROM Right 12/26/2021 Passive in supine - All limited by pain Left 12/26/2021  Shoulder flexion 10   Shoulder extension    Shoulder abduction 5   Shoulder adduction    Shoulder internal rotation To belly With arm at side   Shoulder external rotation -20 With arm at side   Elbow flexion WFL passively   Elbow extension -20 deg c pain in shoulder passively   Wrist flexion    Wrist extension    Wrist ulnar deviation    Wrist radial deviation    Wrist pronation    Wrist supination    (Blank rows = not tested)  UPPER EXTREMITY MMT:   12/26/2021:  not tested due to protocol for surgery - Rt shoulder MMT Right  Left   Shoulder flexion    Shoulder extension    Shoulder  abduction    Shoulder adduction    Shoulder internal rotation    Shoulder external rotation    Middle trapezius    Lower trapezius    Elbow flexion    Elbow extension    Wrist flexion    Wrist extension    Wrist ulnar deviation    Wrist radial deviation    Wrist pronation    Wrist supination    Grip strength (lbs)    (Blank rows = not tested)  SHOULDER SPECIAL TESTS:  12/26/2021: none tested today  JOINT MOBILITY TESTING:  12/26/2021 heavy muscle guarding and pain limited full assessment for Rt shoulder.   PALPATION:  6/14/2023Tenderness throughout Rt shoulder region in muscles and joint, incision sites.    TODAY'S TREATMENT:  12/26/2021  Therex:   HEP instruction/performance c cues for techniques, handout provided.  Trial set performed of each for comprehension and symptom assessment.  See below for exercise list.  Vasopneumatic 10 mins low compression Rt shoulder    PATIENT EDUCATION: 12/26/2021 Education details: HEP, POC Person educated: Patient Education method: Consulting civil engineer, Demonstration, Verbal cues, and Handouts Education comprehension: verbalized understanding, returned demonstration, and verbal cues required   HOME EXERCISE PROGRAM: Access Code: [email or web address] URL: [email or web address] Date: 12/26/2021 Prepared by: Scot Jun  Exercises - Seated Scapular Retraction  - 3-5 x daily - 7 x weekly - 1 sets -  10 reps - 3-5 hold - Circular Shoulder Pendulum with Table Support  - 2-3 x daily - 7 x weekly - 1-2 sets - 10 reps - Flexion-Extension Shoulder Pendulum with Table Support (Mirrored)  - 2-3 x daily - 7 x weekly - 1-2 sets - 10 reps - Horizontal Shoulder Pendulum with Table Support  - 2-3 x daily - 7 x weekly - 1-2 sets - 10 reps - Supported Elbow Flexion Extension PROM (Mirrored)  - 2-3 x daily - 7 x weekly - 1-2 sets - 10 reps  ASSESSMENT:  CLINICAL IMPRESSION: Patient is a 66 y.o. who comes to clinic with complaints of Rt shoulder pain  s/p recent surgery for rotator cuff repair, manipulation, biceps tenodesis on 12/24/2021 with mobility, strength and movement coordination deficits that impair their ability to perform usual daily and recreational functional activities without increase difficulty/symptoms at this time.  Patient to benefit from skilled PT services to address impairments and limitations to improve to previous level of function without restriction secondary to condition.    OBJECTIVE IMPAIRMENTS decreased activity tolerance, decreased coordination, decreased endurance, decreased mobility, decreased ROM, decreased strength, hypomobility, increased edema, increased fascial restrictions, impaired perceived functional ability, impaired flexibility, impaired UE functional use, improper body mechanics, and pain.   ACTIVITY LIMITATIONS carrying, lifting, bed mobility, bathing, toileting, dressing, reach over head, hygiene/grooming, and locomotion level  PARTICIPATION LIMITATIONS: meal prep, cleaning, laundry, interpersonal relationship, community activity, occupation, and yard work  PERSONAL FACTORS  Chronic back pain, CKD, COPD, CAD, depression, DM, GERD  are also affecting patient's functional outcome.   REHAB POTENTIAL: Good  CLINICAL DECISION MAKING: Stable/uncomplicated  EVALUATION COMPLEXITY: Low   GOALS: Goals reviewed with patient? Yes  Short term PT Goals (target date for Short term goals are 3 weeks 01/16/2022) Patient will demonstrate independent use of home exercise program to maintain progress from in clinic treatments. Goal status: New   Long term PT goals (target dates for all long term goals are 10 weeks  03/06/2022 )  1. Patient will demonstrate/report pain at worst less than or equal to 2/10 to facilitate minimal limitation in daily activity secondary to pain symptoms. Goal status: New  2. Patient will demonstrate independent use of home exercise program to facilitate ability to maintain/progress  functional gains from skilled physical therapy services. Goal status: New  3. Patient will demonstrate FOTO outcome > or = 52 % to indicate reduced disability due to condition. Goal status: New  4.  Patient will demonstrate Rt [place] joint mobility WFL to facilitate usual self care, dressing, reaching overhead at PLOF s limitation due to symptoms.  Goal status: New  5.  Patient will demonstrate Rt UE MMT 4/5 or greater throughout to facilitate usual lifting, carrying in functional activity to PLOF s limitation.   Goal status: New  6.  Patient will demonstrate ability to sleep s restriction due to symptom.  Goal status: New   PLAN: PT FREQUENCY:  2-3x/week , then 1-2x/week as able  PT DURATION: 10 weeks  PLANNED INTERVENTIONS: Therapeutic exercises, Therapeutic activity, Neuro Muscular re-education, Balance training, Gait training, Patient/Family education, Joint mobilization, Stair training, DME instructions, Dry Needling, Electrical stimulation, Cryotherapy, Moist heat, Taping, Ultrasound, Ionotophoresis '4mg'$ /ml Dexamethasone, and Manual therapy.  All included unless contraindicated  PLAN FOR NEXT SESSION: PROM only per PROTOCOL/referral from MD   Scot Jun, PT, DPT, OCS, ATC 12/26/21  1:46 PM

## 2021-12-27 ENCOUNTER — Ambulatory Visit: Payer: Medicare Other | Admitting: Physical Therapy

## 2021-12-27 ENCOUNTER — Encounter: Payer: Self-pay | Admitting: Physical Therapy

## 2021-12-27 ENCOUNTER — Ambulatory Visit (INDEPENDENT_AMBULATORY_CARE_PROVIDER_SITE_OTHER): Payer: Medicare Other | Admitting: Physical Therapy

## 2021-12-27 DIAGNOSIS — M25611 Stiffness of right shoulder, not elsewhere classified: Secondary | ICD-10-CM | POA: Diagnosis not present

## 2021-12-27 DIAGNOSIS — R600 Localized edema: Secondary | ICD-10-CM

## 2021-12-27 DIAGNOSIS — M6281 Muscle weakness (generalized): Secondary | ICD-10-CM | POA: Diagnosis not present

## 2021-12-27 DIAGNOSIS — G8929 Other chronic pain: Secondary | ICD-10-CM

## 2021-12-27 DIAGNOSIS — M25511 Pain in right shoulder: Secondary | ICD-10-CM

## 2021-12-27 NOTE — Therapy (Signed)
OUTPATIENT PHYSICAL THERAPY TREATMENT NOTE   Patient Name: [name] [name] MRN: [id number] DOB:1955-09-25, 66 y.o., female Today's Date: 12/27/2021   END OF SESSION:   PT End of Session - 12/27/21 1109     Visit Number 2    Number of Visits 20    Date for PT Re-Evaluation 03/06/22    Authorization Type Medicare    Progress Note Due on Visit 10    PT Start Time 1100    PT Stop Time 1138    PT Time Calculation (min) 38 min    Activity Tolerance Patient limited by pain    Behavior During Therapy WFL for tasks assessed/performed             Past Medical History:  Diagnosis Date   Allergy    seasonal   Anemia    Anxiety    takes Lexapro daily   Arthritis    "back, from neck down pass my bra area" (03/25/2017)   Asthma    Bartholin gland cyst 08/29/2011   Bruises easily    pt is on Effient   Chronic back pain    herniated nucleus pulposus   Chronic back pain    "neck to bra area; lower back" (03/25/2017)   Chronic kidney disease    recurrent UTI's this year 2022   COPD (chronic obstructive pulmonary disease) (HCC)    early stages   Coronary artery disease    Depression    takes Klonopin daily   Diabetes mellitus without complication ([place])    Diverticulosis    Fibroadenoma of left breast    GERD (gastroesophageal reflux disease)    takes Nexium daily   H/O hiatal hernia    Heart attack ([place]) 2011   Hemorrhoids    Hernia    Hyperlipidemia    takes Lipitor daily   Hypertension    takes Losartan daily and Labetalol bid   Hypothyroidism    takes Synthroid daily   Insomnia    hydroxyzine prn   Joint pain    Pneumonia    "couple times" (03/25/2017)   Pre-diabetes    "just found out 1 wk ago" (03/25/2017)   Psoriasis    elbows,knees,back   Shortness of breath    with exertion   Slowing of urinary stream    Stress incontinence    Past Surgical History:  Procedure Laterality Date   [place]    "left one of my ovaries"   BACK SURGERY     BIOPSY  05/27/2019   Procedure: BIOPSY;  Surgeon: Daneil Dolin, MD;  Location: [facility];  Service: Endoscopy;;   COLONOSCOPY  2011   Dr. Ardis Hughs: Mild diverticulosis, descending diminutive colon polyp (not retrieved), next colonoscopy 10 years   CORONARY ANGIOPLASTY WITH STENT PLACEMENT  2011 X2   "regular stents didn't work; had to go back in in ~ 1 month and put in medicated stents"   DILATION AND CURETTAGE OF UTERUS     ESOPHAGOGASTRODUODENOSCOPY     ESOPHAGOGASTRODUODENOSCOPY (EGD) WITH PROPOFOL N/A 05/27/2019   normal esophagus, dilation, erosive gastropathy s/p biopsy, normal duodenum. Negative H.pylori.    LEFT HEART CATH AND CORONARY ANGIOGRAPHY N/A 03/26/2017   Procedure: LEFT HEART CATH AND CORONARY ANGIOGRAPHY;  Surgeon: Belva Crome, MD;  Location: [facility];  Service: Cardiovascular;  Laterality: N/A;   LUMBAR LAMINECTOMY/DECOMPRESSION MICRODISCECTOMY  08/05/2011   Procedure: LUMBAR LAMINECTOMY/DECOMPRESSION MICRODISCECTOMY;  Surgeon: Elyn Aquas  Luiz Ochoa, MD;  Location: [facility];  Service: Neurosurgery;  Laterality: Right;  Right Lumbar four-five extraforaminal discectomy   MALONEY DILATION N/A 05/27/2019   Procedure: Venia Minks DILATION;  Surgeon: Daneil Dolin, MD;  Location: [facility];  Service: Endoscopy;  Laterality: N/A;  54   SHOULDER ARTHROSCOPY WITH ROTATOR CUFF REPAIR AND OPEN BICEPS TENODESIS Right 12/24/2021   Procedure: RIGHT SHOULDER ARTHROSCOPY WITH ROTATOR CUFF REPAIR AND BICEPS TENODESIS;  Surgeon: Marybelle Killings, MD;  Location: [facility];  Service: Orthopedics;  Laterality: Right;   TONSILLECTOMY     as a child   UPPER GASTROINTESTINAL ENDOSCOPY     Patient Active Problem List   Diagnosis Date Noted   Nontraumatic complete tear of right rotator cuff    Superior glenoid labrum lesion of left shoulder    Iron deficiency anemia 11/02/2021   Major depressive disorder, recurrent, in full remission with  anxious distress ([place]) 05/31/2021   Rotator cuff tendinitis, right 05/08/2021   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   Chronic neck pain 02/26/2021   GAD (generalized anxiety disorder) 01/08/2021   Acute cystitis without hematuria 11/07/2020   Influenza vaccine needed 06/15/2020   Abdominal pain, epigastric 04/28/2019   Esophageal dysphagia 04/28/2019   Bipolar I disorder ([place]) 09/17/2018   Insomnia 10/28/2017   Prediabetes 05/28/2017   QT prolongation 03/25/2017   Encounter for screening mammogram for breast cancer 09/11/2015   Psoriasis 06/22/2015   COPD (chronic obstructive pulmonary disease) (HCC)GOLD E 05/18/2015   Anxiety and depression 07/10/2013   Essential hypertension, benign 06/07/2013   Dyslipidemia 06/07/2013   Asthma, chronic 06/07/2013   Emphysema lung ([place]) 06/07/2013   Chronic back pain 06/07/2013   CAD S/P percutaneous coronary angioplasty 06/07/2013   GERD (gastroesophageal reflux disease) 06/07/2013   Breast lump on left side at 3 o'clock position 10/13/2012   S/P abdominal hysterectomy and right salpingo-oophorectomy 08/29/2011   TOBACCO ABUSE 07/17/2009   Chronic rhinitis 07/17/2009   Lung nodule < 6cm on CT 07/17/2009   ALLERGY, FOOD 07/17/2009   Hypothyroidism 12/22/2008     THERAPY DIAG:  Chronic right shoulder pain  Muscle weakness (generalized)  Stiffness of right shoulder, not elsewhere classified  Localized edema  PCP: Troy Sine MD   REFERRING PROVIDER: Marybelle Killings, MD   REFERRING DIAG: M75.81 (ICD-10-CM) - Rotator cuff tendinitis, right   Rationale for Evaluation and Treatment Rehabilitation   ONSET DATE: Surgery 12/24/2021   SUBJECTIVE:  SUBJECTIVE STATEMENT: Pt indicated pain is not good today, she is having difficulty sleeping    PERTINENT HISTORY: Chronic back pain, CKD, COPD, CAD, depression, DM, GERD   PAIN:  NPRS scale: at worst 10/10, currently 9/10 Pain location: Rt shoulder Pain description: constant throbbing, burning.  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    OBJECTIVE:    PATIENT SURVEYS:   12/26/2021: FOTO intake:  20  predicted:  52   COGNITION: 12/26/2021 Overall cognitive status: Within functional limits for tasks assessed                                     SENSATION: 12/26/2021 WFL   POSTURE: 12/26/2021 Rounded shoulder, forward head posture.  Rt arm in sling upon arrival              Localized edema Rt shoulder visibly    UPPER EXTREMITY ROM:    ROM Right 12/26/2021 Passive in supine - All limited by pain Left 12/26/2021  Shoulder flexion 10    Shoulder extension      Shoulder abduction 5    Shoulder adduction      Shoulder internal rotation To belly With arm at side    Shoulder external rotation -20 With arm at side    Elbow flexion WFL passively    Elbow extension -20 deg c pain in shoulder passively    Wrist flexion      Wrist extension      Wrist ulnar deviation      Wrist radial deviation      Wrist pronation      Wrist supination      (Blank rows = not tested)   UPPER EXTREMITY MMT:                         12/26/2021:  not tested due to protocol for surgery - Rt shoulder MMT Right   Left    Shoulder flexion      Shoulder extension      Shoulder abduction      Shoulder adduction      Shoulder internal rotation      Shoulder external rotation      Middle trapezius      Lower trapezius      Elbow flexion      Elbow extension      Wrist flexion      Wrist  extension      Wrist ulnar deviation      Wrist radial deviation      Wrist pronation      Wrist supination      Grip strength (lbs)      (Blank rows = not tested)   SHOULDER SPECIAL TESTS:             12/26/2021: none tested today   JOINT MOBILITY TESTING:  12/26/2021 heavy muscle guarding and pain limited full assessment for Rt shoulder.    PALPATION:  6/14/2023Tenderness throughout Rt shoulder region in muscles and joint, incision sites.  TODAY'S TREATMENT:  12/27/21 Therex:  Seated arm supported scapular retractions X 15  Seated gripping towel X 15 in pronation, and X 15 in neutral  Seated pendulums PROM X 15 each for circles CW, CCW, A-P, lateral  Manual therapy:  Rt shoulder PROM gentle to tolerance in flexion, ER, ABD planes  Modalities:  Vasopnuematic device X 10 min to Rt shoulder, 34 deg, low compression     12/26/2021    Therex:             HEP instruction/performance c cues for techniques, handout provided.  Trial set performed of each for comprehension and symptom assessment.  See below for exercise list.   Vasopneumatic 10 mins low compression Rt shoulder      PATIENT EDUCATION: 12/26/2021 Education details: HEP, POC Person educated: Patient Education method: Consulting civil engineer, Demonstration, Verbal cues, and Handouts Education comprehension: verbalized understanding, returned demonstration, and verbal cues required     HOME EXERCISE PROGRAM: Access Code: [email or web address] URL: [email or web address] Date: 12/26/2021 Prepared by: Scot Jun   Exercises - Seated Scapular Retraction  - 3-5 x daily - 7 x weekly - 1 sets - 10 reps - 3-5 hold - Circular Shoulder Pendulum with Table Support  - 2-3 x daily - 7 x weekly - 1-2 sets - 10 reps - Flexion-Extension Shoulder Pendulum with Table Support (Mirrored)  - 2-3 x daily - 7 x weekly - 1-2 sets - 10 reps - Horizontal Shoulder Pendulum with Table Support  - 2-3 x daily - 7 x weekly - 1-2 sets -  10 reps - Supported Elbow Flexion Extension PROM (Mirrored)  - 2-3 x daily - 7 x weekly - 1-2 sets - 10 reps   ASSESSMENT:   CLINICAL IMPRESSION: Session focused on PROM for Rt shoulder per RTC protocol. She has difficult time with this due to muscle guarding but did appear to have slight improvements in PROM observed. She does get some relief with vasopnuematic at end of session.      OBJECTIVE IMPAIRMENTS decreased activity tolerance, decreased coordination, decreased endurance, decreased mobility, decreased ROM, decreased strength, hypomobility, increased edema, increased fascial restrictions, impaired perceived functional ability, impaired flexibility, impaired UE functional use, improper body mechanics, and pain.    ACTIVITY LIMITATIONS carrying, lifting, bed mobility, bathing, toileting, dressing, reach over head, hygiene/grooming, and locomotion level   PARTICIPATION LIMITATIONS: meal prep, cleaning, laundry, interpersonal relationship, community activity, occupation, and yard work   PERSONAL FACTORS  Chronic back pain, CKD, COPD, CAD, depression, DM, GERD  are also affecting patient's functional outcome.    REHAB POTENTIAL: Good   CLINICAL DECISION MAKING: Stable/uncomplicated   EVALUATION COMPLEXITY: Low     GOALS: Goals reviewed with patient? Yes   Short term PT Goals (target date for Short term goals are 3 weeks 01/16/2022) Patient will demonstrate independent use of home exercise program to maintain progress from in clinic treatments. Goal status: New   Long term PT goals (target dates for all long term goals are 10 weeks  03/06/2022 )   1. Patient will demonstrate/report pain at worst less than or equal to 2/10 to facilitate minimal limitation in daily activity secondary to pain symptoms. Goal status: New   2. Patient will demonstrate independent use of home exercise program to facilitate ability to maintain/progress functional gains from skilled physical therapy  services. Goal status: New   3. Patient will demonstrate FOTO outcome > or = 52 % to indicate reduced disability due to condition. Goal status: New   4.  Patient will demonstrate Rt [place] joint mobility WFL to facilitate usual self care, dressing, reaching overhead at PLOF s limitation due to symptoms.  Goal status: New   5.  Patient will demonstrate Rt UE MMT 4/5 or greater throughout to facilitate usual lifting, carrying in functional activity to PLOF s limitation.   Goal status: New   6.  Patient will demonstrate ability to sleep s restriction due to symptom.  Goal status: New     PLAN: PT FREQUENCY:  2-3x/week , then 1-2x/week as able   PT DURATION: 10 weeks   PLANNED INTERVENTIONS: Therapeutic exercises, Therapeutic activity, Neuro Muscular re-education, Balance training, Gait training, Patient/Family education, Joint mobilization, Stair training, DME instructions, Dry Needling, Electrical stimulation, Cryotherapy, Moist heat, Taping, Ultrasound, Ionotophoresis '4mg'$ /ml Dexamethasone, and Manual therapy.  All included unless contraindicated   PLAN FOR NEXT SESSION: PROM only per PROTOCOL/referral from MD, vaso at end if desired.   Debbe Odea, PT,DPT 12/27/2021, 11:11 AM

## 2021-12-28 ENCOUNTER — Ambulatory Visit (INDEPENDENT_AMBULATORY_CARE_PROVIDER_SITE_OTHER): Payer: Medicare Other | Admitting: Physical Therapy

## 2021-12-28 ENCOUNTER — Encounter: Payer: Self-pay | Admitting: Physical Therapy

## 2021-12-28 DIAGNOSIS — M6281 Muscle weakness (generalized): Secondary | ICD-10-CM

## 2021-12-28 DIAGNOSIS — M25511 Pain in right shoulder: Secondary | ICD-10-CM | POA: Diagnosis not present

## 2021-12-28 DIAGNOSIS — M25611 Stiffness of right shoulder, not elsewhere classified: Secondary | ICD-10-CM

## 2021-12-28 DIAGNOSIS — R600 Localized edema: Secondary | ICD-10-CM | POA: Diagnosis not present

## 2021-12-28 DIAGNOSIS — G8929 Other chronic pain: Secondary | ICD-10-CM

## 2021-12-28 NOTE — Therapy (Signed)
OUTPATIENT PHYSICAL THERAPY TREATMENT NOTE   Patient Name: [name] [name] MRN: [id number] DOB:03-05-1956, 66 y.o., female Today's Date: 12/28/2021   END OF SESSION:   PT End of Session - 12/28/21 0923     Visit Number 3    Number of Visits 20    Date for PT Re-Evaluation 03/06/22    Authorization Type Medicare    Progress Note Due on Visit 10    PT Start Time 0925    PT Stop Time 1003    PT Time Calculation (min) 38 min    Activity Tolerance Patient limited by pain    Behavior During Therapy [facility] for tasks assessed/performed             Past Medical History:  Diagnosis Date   Allergy    seasonal   Anemia    Anxiety    takes Lexapro daily   Arthritis    "back, from neck down pass my bra area" (03/25/2017)   Asthma    Bartholin gland cyst 08/29/2011   Bruises easily    pt is on Effient   Chronic back pain    herniated nucleus pulposus   Chronic back pain    "neck to bra area; lower back" (03/25/2017)   Chronic kidney disease    recurrent UTI's this year 2022   COPD (chronic obstructive pulmonary disease) (HCC)    early stages   Coronary artery disease    Depression    takes Klonopin daily   Diabetes mellitus without complication ([place])    Diverticulosis    Fibroadenoma of left breast    GERD (gastroesophageal reflux disease)    takes Nexium daily   H/O hiatal hernia    Heart attack ([place]) 2011   Hemorrhoids    Hernia    Hyperlipidemia    takes Lipitor daily   Hypertension    takes Losartan daily and Labetalol bid   Hypothyroidism    takes Synthroid daily   Insomnia    hydroxyzine prn   Joint pain    Pneumonia    "couple times" (03/25/2017)   Pre-diabetes    "just found out 1 wk ago" (03/25/2017)   Psoriasis    elbows,knees,back   Shortness of breath    with exertion   Slowing of urinary stream    Stress incontinence    Past Surgical History:  Procedure Laterality Date   [place]    "left one of my ovaries"   BACK SURGERY     BIOPSY  05/27/2019   Procedure: BIOPSY;  Surgeon: Daneil Dolin, MD;  [facility];  Service: Endoscopy;;   COLONOSCOPY  2011   Dr. Ardis Hughs: Mild diverticulosis, descending diminutive colon polyp (not retrieved), next colonoscopy 10 years   CORONARY ANGIOPLASTY WITH STENT PLACEMENT  2011 X2   "regular stents didn't work; had to go back in in ~ 1 month and put in medicated stents"   DILATION AND CURETTAGE OF UTERUS     ESOPHAGOGASTRODUODENOSCOPY     ESOPHAGOGASTRODUODENOSCOPY (EGD) WITH PROPOFOL N/A 05/27/2019   normal esophagus, dilation, erosive gastropathy s/p biopsy, normal duodenum. Negative H.pylori.    LEFT HEART CATH AND CORONARY ANGIOGRAPHY N/A 03/26/2017   Procedure: LEFT HEART CATH AND CORONARY ANGIOGRAPHY;  Surgeon: Belva Crome, MD;  Location: [place] [facility];  Service: Cardiovascular;  Laterality: N/A;   LUMBAR LAMINECTOMY/DECOMPRESSION MICRODISCECTOMY  08/05/2011   Procedure: LUMBAR LAMINECTOMY/DECOMPRESSION MICRODISCECTOMY;  Surgeon: Elyn Aquas  Luiz Ochoa, MD;  Location: [place] [facility];  Service: Neurosurgery;  Laterality: Right;  Right Lumbar four-five extraforaminal discectomy   MALONEY DILATION N/A 05/27/2019   Procedure: Venia Minks DILATION;  Surgeon: Daneil Dolin, MD;  [facility];  Service: Endoscopy;  Laterality: N/A;  54   SHOULDER ARTHROSCOPY WITH ROTATOR CUFF REPAIR AND OPEN BICEPS TENODESIS Right 12/24/2021   Procedure: RIGHT SHOULDER ARTHROSCOPY WITH ROTATOR CUFF REPAIR AND BICEPS TENODESIS;  Surgeon: Marybelle Killings, MD;  Location: [place];  Service: Orthopedics;  Laterality: Right;   TONSILLECTOMY     as a child   UPPER GASTROINTESTINAL ENDOSCOPY     Patient Active Problem List   Diagnosis Date Noted   Nontraumatic complete tear of right rotator cuff    Superior glenoid labrum lesion of left shoulder    Iron deficiency anemia 11/02/2021   Major depressive disorder, recurrent, in full remission with  anxious distress ([place]) 05/31/2021   Rotator cuff tendinitis, right 05/08/2021   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   Chronic neck pain 02/26/2021   GAD (generalized anxiety disorder) 01/08/2021   Acute cystitis without hematuria 11/07/2020   Influenza vaccine needed 06/15/2020   Abdominal pain, epigastric 04/28/2019   Esophageal dysphagia 04/28/2019   Bipolar I disorder ([place]) 09/17/2018   Insomnia 10/28/2017   Prediabetes 05/28/2017   QT prolongation 03/25/2017   Encounter for screening mammogram for breast cancer 09/11/2015   Psoriasis 06/22/2015   COPD (chronic obstructive pulmonary disease) (HCC)GOLD E 05/18/2015   Anxiety and depression 07/10/2013   Essential hypertension, benign 06/07/2013   Dyslipidemia 06/07/2013   Asthma, chronic 06/07/2013   Emphysema lung ([place]) 06/07/2013   Chronic back pain 06/07/2013   CAD S/P percutaneous coronary angioplasty 06/07/2013   GERD (gastroesophageal reflux disease) 06/07/2013   Breast lump on left side at 3 o'clock position 10/13/2012   S/P abdominal hysterectomy and right salpingo-oophorectomy 08/29/2011   TOBACCO ABUSE 07/17/2009   Chronic rhinitis 07/17/2009   Lung nodule < 6cm on CT 07/17/2009   ALLERGY, FOOD 07/17/2009   Hypothyroidism 12/22/2008     THERAPY DIAG:  Chronic right shoulder pain  Muscle weakness (generalized)  Stiffness of right shoulder, not elsewhere classified  Localized edema  PCP: Troy Sine MD   REFERRING PROVIDER: Marybelle Killings, MD   REFERRING DIAG: M75.81 (ICD-10-CM) - Rotator cuff tendinitis, right   Rationale for Evaluation and Treatment Rehabilitation   ONSET DATE: Surgery 12/24/2021   SUBJECTIVE:  SUBJECTIVE STATEMENT: Pt indicated pain is still bad, still cant sleep well   PERTINENT  HISTORY: Chronic back pain, CKD, COPD, CAD, depression, DM, GERD   PAIN:  NPRS scale: at worst 10/10, currently 9/10 Pain location: Rt shoulder Pain description: constant throbbing, burning.  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    OBJECTIVE:    PATIENT SURVEYS:   12/26/2021: FOTO intake:  20  predicted:  52   COGNITION: 12/26/2021 Overall cognitive status: Within functional limits for tasks assessed                                     SENSATION: 12/26/2021 WFL   POSTURE: 12/26/2021 Rounded shoulder, forward head posture.  Rt arm in sling upon arrival              Localized edema Rt shoulder visibly    UPPER EXTREMITY ROM:    ROM Right 12/26/2021 Passive in supine - All limited by pain Left 12/26/2021  Shoulder flexion 10    Shoulder extension      Shoulder abduction 5    Shoulder adduction      Shoulder internal rotation To belly With arm at side    Shoulder external rotation -20 With arm at side    Elbow flexion WFL passively    Elbow extension -20 deg c pain in shoulder passively    Wrist flexion      Wrist extension      Wrist ulnar deviation      Wrist radial deviation      Wrist pronation      Wrist supination      (Blank rows = not tested)   UPPER EXTREMITY MMT:                         12/26/2021:  not tested due to protocol for surgery - Rt shoulder MMT Right   Left    Shoulder flexion      Shoulder extension      Shoulder abduction      Shoulder adduction      Shoulder internal rotation      Shoulder external rotation      Middle trapezius      Lower trapezius      Elbow flexion      Elbow extension      Wrist flexion      Wrist extension       Wrist ulnar deviation      Wrist radial deviation      Wrist pronation      Wrist supination      Grip strength (lbs)      (Blank rows = not tested)   SHOULDER SPECIAL TESTS:             12/26/2021: none tested today   JOINT MOBILITY TESTING:  12/26/2021 heavy muscle guarding and pain limited full assessment for Rt shoulder.    PALPATION:  6/14/2023Tenderness throughout Rt shoulder region in muscles and joint, incision sites.               TODAY'S  TREATMENT:  12/28/21 Therex:  Seated arm supported scapular retractions X 20  Seated posterior shoulder rolls 2X10  Seated elbow self  flexion/extension PROM with hands clasped X 20  Seated gripping towel X 20 in pronation, and X 20 in neutral  Seated pendulums PROM leaning over and hands clasped X 20 each for circles CW, CCW, A-P, lateral  Manual therapy:  Rt shoulder PROM gentle to tolerance in flexion, ER, ABD planes  Modalities:  Vasopnuematic device X 10 min to Rt shoulder, 34 deg, low compression  12/27/21 Therex:  Seated arm supported scapular retractions X 15  Seated gripping towel X 15 in pronation, and X 15 in neutral  Seated pendulums PROM X 15 each for circles CW, CCW, A-P, lateral  Manual therapy:  Rt shoulder PROM gentle to tolerance in flexion, ER, ABD planes  Modalities:  Vasopnuematic device X 10 min to Rt shoulder, 34 deg, low compression     12/26/2021    Therex:             HEP instruction/performance c cues for techniques, handout provided.  Trial set performed of each for comprehension and symptom assessment.  See below for exercise list.   Vasopneumatic 10 mins low compression Rt shoulder      PATIENT EDUCATION: 12/26/2021 Education details: HEP, POC Person educated: Patient Education method: Consulting civil engineer, Demonstration, Verbal cues, and Handouts Education comprehension: verbalized understanding, returned demonstration, and verbal cues required     HOME EXERCISE PROGRAM: Access Code:  [email or web address] URL: [email or web address] Date: 12/26/2021 Prepared by: Scot Jun   Exercises - Seated Scapular Retraction  - 3-5 x daily - 7 x weekly - 1 sets - 10 reps - 3-5 hold - Circular Shoulder Pendulum with Table Support  - 2-3 x daily - 7 x weekly - 1-2 sets - 10 reps - Flexion-Extension Shoulder Pendulum with Table Support (Mirrored)  - 2-3 x daily - 7 x weekly - 1-2 sets - 10 reps - Horizontal Shoulder Pendulum with Table Support  - 2-3 x daily - 7 x weekly - 1-2 sets - 10 reps - Supported Elbow Flexion Extension PROM (Mirrored)  - 2-3 x daily - 7 x weekly - 1-2 sets - 10 reps   ASSESSMENT:   CLINICAL IMPRESSION: We continued to work to improve her PROM per protocol today to her tolerance. She did have some reductions in muscle guarding overall with manual therapy PROM today.  Continue current PT plan of care      OBJECTIVE IMPAIRMENTS decreased activity tolerance, decreased coordination, decreased endurance, decreased mobility, decreased ROM, decreased strength, hypomobility, increased edema, increased fascial restrictions, impaired perceived functional ability, impaired flexibility, impaired UE functional use, improper body mechanics, and pain.    ACTIVITY LIMITATIONS carrying, lifting, bed mobility, bathing, toileting, dressing, reach over head, hygiene/grooming, and locomotion level   PARTICIPATION LIMITATIONS: meal prep, cleaning, laundry, interpersonal relationship, community activity, occupation, and yard work   PERSONAL FACTORS  Chronic back pain, CKD, COPD, CAD, depression, DM, GERD  are also affecting patient's functional outcome.    REHAB POTENTIAL: Good   CLINICAL DECISION MAKING: Stable/uncomplicated   EVALUATION COMPLEXITY: Low     GOALS: Goals reviewed with patient? Yes   Short term PT Goals (target date for Short term goals are 3 weeks 01/16/2022) Patient will demonstrate independent use of home exercise program to maintain progress from in  clinic treatments. Goal status: New   Long term PT goals (target dates for all long term goals are 10 weeks  03/06/2022 )  1. Patient will demonstrate/report pain at worst less than or equal to 2/10 to facilitate minimal limitation in daily activity secondary to pain symptoms. Goal status: New   2. Patient will demonstrate independent use of home exercise program to facilitate ability to maintain/progress functional gains from skilled physical therapy services. Goal status: New   3. Patient will demonstrate FOTO outcome > or = 52 % to indicate reduced disability due to condition. Goal status: New   4.  Patient will demonstrate Rt [place] joint mobility WFL to facilitate usual self care, dressing, reaching overhead at PLOF s limitation due to symptoms.  Goal status: New   5.  Patient will demonstrate Rt UE MMT 4/5 or greater throughout to facilitate usual lifting, carrying in functional activity to PLOF s limitation.   Goal status: New   6.  Patient will demonstrate ability to sleep s restriction due to symptom.  Goal status: New     PLAN: PT FREQUENCY:  2-3x/week , then 1-2x/week as able   PT DURATION: 10 weeks   PLANNED INTERVENTIONS: Therapeutic exercises, Therapeutic activity, Neuro Muscular re-education, Balance training, Gait training, Patient/Family education, Joint mobilization, Stair training, DME instructions, Dry Needling, Electrical stimulation, Cryotherapy, Moist heat, Taping, Ultrasound, Ionotophoresis '4mg'$ /ml Dexamethasone, and Manual therapy.  All included unless contraindicated   PLAN FOR NEXT SESSION: PROM only per PROTOCOL/referral from MD until 02/04/22, vaso at end if desired.   Debbe Odea, PT,DPT 12/28/2021, 9:24 AM

## 2021-12-31 ENCOUNTER — Other Ambulatory Visit: Payer: Self-pay

## 2022-01-01 ENCOUNTER — Ambulatory Visit (INDEPENDENT_AMBULATORY_CARE_PROVIDER_SITE_OTHER): Payer: Medicare Other | Admitting: Orthopaedic Surgery

## 2022-01-01 VITALS — BP 143/76 | HR 60

## 2022-01-01 DIAGNOSIS — S43432D Superior glenoid labrum lesion of left shoulder, subsequent encounter: Secondary | ICD-10-CM

## 2022-01-01 DIAGNOSIS — M7581 Other shoulder lesions, right shoulder: Secondary | ICD-10-CM

## 2022-01-01 MED ORDER — OXYCODONE-ACETAMINOPHEN 5-325 MG PO TABS
1.0000 | ORAL_TABLET | ORAL | 0 refills | Status: DC | PRN
Start: 2022-01-01 — End: 2022-03-11

## 2022-01-01 NOTE — Progress Notes (Signed)
Post-Op Visit Note   Patient: [name] [name]           Date of Birth: 28-Mar-1956           MRN: [id number] Visit Date: 01/01/2022 PCP: Pcp, No   Assessment & Plan: Follow-up right shoulder biceps tenodesis rotator cuff repair.  Oxycodone renewed.  She had problems with adhesive capsulitis and working on passive range of motion.  She will not begin any resistive biceps or resistive supraspinatus work until she is 6 weeks postop.  Steri-Strips changed anterior and posterior suture removed.  Chief Complaint:  Chief Complaint  Patient presents with   Right Shoulder - Routine Post Op   Visit Diagnoses:  1. Superior glenoid labrum lesion of left shoulder, subsequent encounter   2. Rotator cuff tendinitis, right     Plan: Recheck 4 weeks continue therapy for passive range of motion to prevent recurrent adhesive capsulitis.  Continue to protect biceps tendon tenodesis and rotator cuff repair until she is 6 weeks postop.  Follow-Up Instructions: Return in about 4 weeks (around 01/29/2022).   Orders:  No orders of the defined types were placed in this encounter.  Meds ordered this encounter  Medications   oxyCODONE-acetaminophen (PERCOCET/ROXICET) 5-325 MG tablet    Sig: Take 1 tablet by mouth every 4 (four) hours as needed for severe pain.    Dispense:  50 tablet    Refill:  0    Post op pain    Imaging: No results found.  PMFS History: Patient Active Problem List   Diagnosis Date Noted   Nontraumatic complete tear of right rotator cuff    Superior glenoid labrum lesion of left shoulder    Iron deficiency anemia 11/02/2021   Major depressive disorder, recurrent, in full remission with anxious distress ([place]) 05/31/2021   Rotator cuff tendinitis, right 05/08/2021   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   Chronic neck pain 02/26/2021   GAD (generalized anxiety disorder) 01/08/2021   Acute cystitis without hematuria 11/07/2020   Influenza vaccine needed 06/15/2020    Abdominal pain, epigastric 04/28/2019   Esophageal dysphagia 04/28/2019   Bipolar I disorder ([place]) 09/17/2018   Insomnia 10/28/2017   Prediabetes 05/28/2017   QT prolongation 03/25/2017   Encounter for screening mammogram for breast cancer 09/11/2015   Psoriasis 06/22/2015   COPD (chronic obstructive pulmonary disease) (HCC)GOLD E 05/18/2015   Anxiety and depression 07/10/2013   Essential hypertension, benign 06/07/2013   Dyslipidemia 06/07/2013   Asthma, chronic 06/07/2013   Emphysema lung ([place]) 06/07/2013   Chronic back pain 06/07/2013   CAD S/P percutaneous coronary angioplasty 06/07/2013   GERD (gastroesophageal reflux disease) 06/07/2013   Breast lump on left side at 3 o'clock position 10/13/2012   S/P abdominal hysterectomy and right salpingo-oophorectomy 08/29/2011   TOBACCO ABUSE 07/17/2009   Chronic rhinitis 07/17/2009   Lung nodule < 6cm on CT 07/17/2009   ALLERGY, FOOD 07/17/2009   Hypothyroidism 12/22/2008   Past Medical History:  Diagnosis Date   Allergy    seasonal   Anemia    Anxiety    takes Lexapro daily   Arthritis    "back, from neck down pass my bra area" (03/25/2017)   Asthma    Bartholin gland cyst 08/29/2011   Bruises easily    pt is on Effient   Chronic back pain    herniated nucleus pulposus   Chronic back pain    "neck to bra area; lower back" (03/25/2017)   Chronic kidney disease  recurrent UTI's this year 2022   COPD (chronic obstructive pulmonary disease) (HCC)    early stages   Coronary artery disease    Depression    takes Klonopin daily   Diabetes mellitus without complication (HCC)    Diverticulosis    Fibroadenoma of left breast    GERD (gastroesophageal reflux disease)    takes Nexium daily   H/O hiatal hernia    Heart attack ([place]) 2011   Hemorrhoids    Hernia    Hyperlipidemia    takes Lipitor daily   Hypertension    takes Losartan daily and Labetalol bid   Hypothyroidism    takes Synthroid daily   Insomnia     hydroxyzine prn   Joint pain    Pneumonia    "couple times" (03/25/2017)   Pre-diabetes    "just found out 1 wk ago" (03/25/2017)   Psoriasis    elbows,knees,back   Shortness of breath    with exertion   Slowing of urinary stream    Stress incontinence     Family History  Problem Relation Age of Onset   Heart disease Mother    Hypertension Mother    Stroke Mother    Mental illness Mother    Heart disease Father    Hypertension Father    Diabetes Father    Colon polyps Brother    Breast cancer Maternal Aunt    Throat cancer Maternal Uncle    Thyroid disease Paternal Aunt    Heart disease Maternal Grandfather    Cancer Paternal Grandmother        [place]   Anesthesia problems Daughter    Hypotension Neg Hx    Malignant hyperthermia Neg Hx    Pseudochol deficiency Neg Hx    Colon cancer Neg Hx    Stomach cancer Neg Hx    Esophageal cancer Neg Hx    Pancreatic cancer Neg Hx    Rectal cancer Neg Hx     Past Surgical History:  Procedure Laterality Date   ABDOMINAL EXPLORATION SURGERY  1977   ABDOMINAL HYSTERECTOMY  1977   "left one of my ovaries"   BACK SURGERY     BIOPSY  05/27/2019   Procedure: BIOPSY;  Surgeon: Daneil Dolin, MD;  [facility];  Service: Endoscopy;;   COLONOSCOPY  2011   Dr. Ardis Hughs: Mild diverticulosis, descending diminutive colon polyp (not retrieved), next colonoscopy 10 years   CORONARY ANGIOPLASTY WITH STENT PLACEMENT  2011 X2   "regular stents didn't work; had to go back in in ~ 1 month and put in medicated stents"   DILATION AND CURETTAGE OF UTERUS     ESOPHAGOGASTRODUODENOSCOPY     ESOPHAGOGASTRODUODENOSCOPY (EGD) WITH PROPOFOL N/A 05/27/2019   normal esophagus, dilation, erosive gastropathy s/p biopsy, normal duodenum. Negative H.pylori.    LEFT HEART CATH AND CORONARY ANGIOGRAPHY N/A 03/26/2017   Procedure: LEFT HEART CATH AND CORONARY ANGIOGRAPHY;  Surgeon: Belva Crome, MD;  Location: [place] [facility];  Service:  Cardiovascular;  Laterality: N/A;   LUMBAR LAMINECTOMY/DECOMPRESSION MICRODISCECTOMY  08/05/2011   Procedure: LUMBAR LAMINECTOMY/DECOMPRESSION MICRODISCECTOMY;  Surgeon: Otilio Connors, MD;  Location: [place] [facility];  Service: Neurosurgery;  Laterality: Right;  Right Lumbar four-five extraforaminal discectomy   MALONEY DILATION N/A 05/27/2019   Procedure: Venia Minks DILATION;  Surgeon: Daneil Dolin, MD;  [facility];  Service: Endoscopy;  Laterality: N/A;  54   SHOULDER ARTHROSCOPY WITH ROTATOR CUFF REPAIR AND OPEN BICEPS TENODESIS Right 12/24/2021  Procedure: RIGHT SHOULDER ARTHROSCOPY WITH ROTATOR CUFF REPAIR AND BICEPS TENODESIS;  Surgeon: Marybelle Killings, MD;  Location: [facility];  Service: Orthopedics;  Laterality: Right;   TONSILLECTOMY     as a child   UPPER GASTROINTESTINAL ENDOSCOPY     Social History   Occupational History   Not on file  Tobacco Use   Smoking status: Every Day    Packs/day: 0.50    Years: 50.00    Total pack years: 25.00    Types: Cigarettes   Smokeless tobacco: Never  Vaping Use   Vaping Use: Some days   Substances: Nicotine  Substance and Sexual Activity   Alcohol use: Yes    Comment: once a week   Drug use: Yes    Types: Marijuana    Comment: history of cocaine use, been about a year, per patient (12/18/21)   Sexual activity: Not Currently    Birth control/protection: Surgical

## 2022-01-02 ENCOUNTER — Encounter: Payer: Self-pay | Admitting: Rehabilitative and Restorative Service Providers"

## 2022-01-02 ENCOUNTER — Ambulatory Visit (INDEPENDENT_AMBULATORY_CARE_PROVIDER_SITE_OTHER): Payer: Medicare Other | Admitting: Rehabilitative and Restorative Service Providers"

## 2022-01-02 DIAGNOSIS — G8929 Other chronic pain: Secondary | ICD-10-CM | POA: Diagnosis not present

## 2022-01-02 DIAGNOSIS — M25611 Stiffness of right shoulder, not elsewhere classified: Secondary | ICD-10-CM | POA: Diagnosis not present

## 2022-01-02 DIAGNOSIS — M6281 Muscle weakness (generalized): Secondary | ICD-10-CM | POA: Diagnosis not present

## 2022-01-02 DIAGNOSIS — R600 Localized edema: Secondary | ICD-10-CM

## 2022-01-02 DIAGNOSIS — M25511 Pain in right shoulder: Secondary | ICD-10-CM | POA: Diagnosis not present

## 2022-01-02 NOTE — Therapy (Signed)
OUTPATIENT PHYSICAL THERAPY TREATMENT NOTE   Patient Name: [name] [name] MRN: [id number] DOB:07-08-1956, 66 y.o., female Today's Date: 01/02/2022   END OF SESSION:   PT End of Session - 01/02/22 1041     Visit Number 4    Number of Visits 20    Date for PT Re-Evaluation 03/06/22    Authorization Type Medicare    Progress Note Due on Visit 10    PT Start Time 1010    PT Stop Time 1052    PT Time Calculation (min) 42 min    Activity Tolerance Patient limited by pain    Behavior During Therapy WFL for tasks assessed/performed              Past Medical History:  Diagnosis Date   Allergy    seasonal   Anemia    Anxiety    takes Lexapro daily   Arthritis    "back, from neck down pass my bra area" (03/25/2017)   Asthma    Bartholin gland cyst 08/29/2011   Bruises easily    pt is on Effient   Chronic back pain    herniated nucleus pulposus   Chronic back pain    "neck to bra area; lower back" (03/25/2017)   Chronic kidney disease    recurrent UTI's this year 2022   COPD (chronic obstructive pulmonary disease) (HCC)    early stages   Coronary artery disease    Depression    takes Klonopin daily   Diabetes mellitus without complication ([place])    Diverticulosis    Fibroadenoma of left breast    GERD (gastroesophageal reflux disease)    takes Nexium daily   H/O hiatal hernia    Heart attack ([place]) 2011   Hemorrhoids    Hernia    Hyperlipidemia    takes Lipitor daily   Hypertension    takes Losartan daily and Labetalol bid   Hypothyroidism    takes Synthroid daily   Insomnia    hydroxyzine prn   Joint pain    Pneumonia    "couple times" (03/25/2017)   Pre-diabetes    "just found out 1 wk ago" (03/25/2017)   Psoriasis    elbows,knees,back   Shortness of breath    with exertion   Slowing of urinary stream    Stress incontinence    Past Surgical History:  Procedure Laterality Date   [place]    "left one of my ovaries"   BACK SURGERY     BIOPSY  05/27/2019   Procedure: BIOPSY;  Surgeon: Daneil Dolin, MD;  Location: [facility];  Service: Endoscopy;;   COLONOSCOPY  2011   Dr. Ardis Hughs: Mild diverticulosis, descending diminutive colon polyp (not retrieved), next colonoscopy 10 years   CORONARY ANGIOPLASTY WITH STENT PLACEMENT  2011 X2   "regular stents didn't work; had to go back in in ~ 1 month and put in medicated stents"   DILATION AND CURETTAGE OF UTERUS     ESOPHAGOGASTRODUODENOSCOPY     ESOPHAGOGASTRODUODENOSCOPY (EGD) WITH PROPOFOL N/A 05/27/2019   normal esophagus, dilation, erosive gastropathy s/p biopsy, normal duodenum. Negative H.pylori.    LEFT HEART CATH AND CORONARY ANGIOGRAPHY N/A 03/26/2017   Procedure: LEFT HEART CATH AND CORONARY ANGIOGRAPHY;  Surgeon: Belva Crome, MD;  Location: [facility];  Service: Cardiovascular;  Laterality: N/A;   LUMBAR LAMINECTOMY/DECOMPRESSION MICRODISCECTOMY  08/05/2011   Procedure: LUMBAR LAMINECTOMY/DECOMPRESSION MICRODISCECTOMY;  Surgeon: Jeneen Rinks  Peter Garter, MD;  Location: [facility];  Service: Neurosurgery;  Laterality: Right;  Right Lumbar four-five extraforaminal discectomy   MALONEY DILATION N/A 05/27/2019   Procedure: Venia Minks DILATION;  Surgeon: Daneil Dolin, MD;  Location: [facility];  Service: Endoscopy;  Laterality: N/A;  54   SHOULDER ARTHROSCOPY WITH ROTATOR CUFF REPAIR AND OPEN BICEPS TENODESIS Right 12/24/2021   Procedure: RIGHT SHOULDER ARTHROSCOPY WITH ROTATOR CUFF REPAIR AND BICEPS TENODESIS;  Surgeon: Marybelle Killings, MD;  Location: [facility];  Service: Orthopedics;  Laterality: Right;   TONSILLECTOMY     as a child   UPPER GASTROINTESTINAL ENDOSCOPY     Patient Active Problem List   Diagnosis Date Noted   Nontraumatic complete tear of right rotator cuff    Superior glenoid labrum lesion of left shoulder    Iron deficiency anemia 11/02/2021   Major depressive disorder, recurrent, in full remission with  anxious distress ([place]) 05/31/2021   Rotator cuff tendinitis, right 05/08/2021   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   Chronic neck pain 02/26/2021   GAD (generalized anxiety disorder) 01/08/2021   Acute cystitis without hematuria 11/07/2020   Influenza vaccine needed 06/15/2020   Abdominal pain, epigastric 04/28/2019   Esophageal dysphagia 04/28/2019   Bipolar I disorder ([place]) 09/17/2018   Insomnia 10/28/2017   Prediabetes 05/28/2017   QT prolongation 03/25/2017   Encounter for screening mammogram for breast cancer 09/11/2015   Psoriasis 06/22/2015   COPD (chronic obstructive pulmonary disease) (HCC)GOLD E 05/18/2015   Anxiety and depression 07/10/2013   Essential hypertension, benign 06/07/2013   Dyslipidemia 06/07/2013   Asthma, chronic 06/07/2013   Emphysema lung ([place]) 06/07/2013   Chronic back pain 06/07/2013   CAD S/P percutaneous coronary angioplasty 06/07/2013   GERD (gastroesophageal reflux disease) 06/07/2013   Breast lump on left side at 3 o'clock position 10/13/2012   S/P abdominal hysterectomy and right salpingo-oophorectomy 08/29/2011   TOBACCO ABUSE 07/17/2009   Chronic rhinitis 07/17/2009   Lung nodule < 6cm on CT 07/17/2009   ALLERGY, FOOD 07/17/2009   Hypothyroidism 12/22/2008     THERAPY DIAG:  Chronic right shoulder pain  Muscle weakness (generalized)  Stiffness of right shoulder, not elsewhere classified  Localized edema  PCP: Troy Sine MD   REFERRING PROVIDER: Marybelle Killings, MD   REFERRING DIAG: M75.81 (ICD-10-CM) - Rotator cuff tendinitis, right   Rationale for Evaluation and Treatment Rehabilitation   ONSET DATE: Surgery 12/24/2021   SUBJECTIVE:  SUBJECTIVE STATEMENT: Pt indicated no use of exercise at homes ( was confused if she could or not).   Pt indicated weather has hurt her.  Constant pain reported mostly.    PERTINENT HISTORY: Chronic back pain, CKD, COPD, CAD, depression, DM, GERD   PAIN:  NPRS scale: 10/10 at times.  Pain location: Rt shoulder Pain description: constant throbbing, burning.  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    OBJECTIVE:    PATIENT SURVEYS:   12/26/2021: FOTO intake:  20  predicted:  52   COGNITION: 12/26/2021 Overall cognitive status: Within functional limits for tasks assessed                                     SENSATION: 12/26/2021 WFL   POSTURE: 12/26/2021 Rounded shoulder, forward head posture.  Rt arm in sling upon arrival              Localized edema Rt shoulder visibly    UPPER EXTREMITY ROM:    ROM Right 12/26/2021 Passive in supine - All limited by pain Left 12/26/2021 Right 01/02/2022  PROM - limited by pain response  Shoulder flexion 10   100 in supine  Shoulder extension       Shoulder abduction 5   90 in supine  Shoulder adduction       Shoulder internal rotation To belly With arm at side     Shoulder external rotation -20 With arm at side   0 in 30 deg abduction   Elbow flexion WFL passively     Elbow extension -20 deg c pain in shoulder passively     Wrist flexion       Wrist extension       Wrist ulnar deviation       Wrist radial deviation       Wrist pronation       Wrist supination       (Blank rows = not tested)   UPPER EXTREMITY MMT:                         12/26/2021:  not tested due to protocol for surgery - Rt shoulder MMT Right   Left    Shoulder flexion      Shoulder extension      Shoulder abduction      Shoulder adduction      Shoulder  internal rotation      Shoulder external rotation      Middle trapezius      Lower trapezius      Elbow flexion      Elbow extension      Wrist flexion      Wrist extension      Wrist ulnar deviation      Wrist radial deviation      Wrist pronation      Wrist supination      Grip strength (lbs)      (Blank rows = not tested)   SHOULDER SPECIAL TESTS:             12/26/2021: none tested  today   JOINT MOBILITY TESTING:  12/26/2021 heavy muscle guarding and pain limited full assessment for Rt shoulder.    PALPATION:  6/14/2023Tenderness throughout Rt shoulder region in muscles and joint, incision sites.               TODAY'S TREATMENT:  01/02/22 Therex:  Seated scapular retraction 5 sec hold x 10   Seated elbow self  flexion/extension PROM c Lt arm moving Rt x 10  Seated pendulums c Lt arm supporting on Rt elbow flexion to tolerance x 10, add/abd to tolerance x 10, circles x 10   Review of HEP for home use (assured Pt she could do these at home safely)  Manual therapy:  Rt shoulder PROM gentle to tolerance in flexion, ER, ABD.  G1-g2 mid range mobilizations to Rt [facility] joint  Modalities:  Vasopnuematic device X 10 min to Rt shoulder, 34 deg, low compression  Pre mod Rt shoulder joint 10 mins to tolerance level with vaso use.   12/28/21 Therex:  Seated arm supported scapular retractions X 20  Seated posterior shoulder rolls 2X10  Seated elbow self  flexion/extension PROM with hands clasped X 20  Seated gripping towel X 20 in pronation, and X 20 in neutral  Seated pendulums PROM leaning over and hands clasped X 20 each for circles CW, CCW, A-P, lateral  Manual therapy:  Rt shoulder PROM gentle to tolerance in flexion, ER, ABD planes  Modalities:  Vasopnuematic device X 10 min to Rt shoulder, 34 deg, low compression  12/27/21 Therex:  Seated arm supported scapular retractions X 15  Seated gripping towel X 15 in pronation, and X 15 in neutral  Seated pendulums PROM X 15  each for circles CW, CCW, A-P, lateral  Manual therapy:  Rt shoulder PROM gentle to tolerance in flexion, ER, ABD planes  Modalities:  Vasopnuematic device X 10 min to Rt shoulder, 34 deg, low compression      PATIENT EDUCATION: 12/26/2021 Education details: HEP, POC Person educated: Patient Education method: Consulting civil engineer, Media planner, Verbal cues, and Handouts Education comprehension: verbalized understanding, returned demonstration, and verbal cues required     HOME EXERCISE PROGRAM: Access Code: [email or web address] URL: [email or web address] Date: 12/26/2021 Prepared by: Scot Jun   Exercises - Seated Scapular Retraction  - 3-5 x daily - 7 x weekly - 1 sets - 10 reps - 3-5 hold - Circular Shoulder Pendulum with Table Support  - 2-3 x daily - 7 x weekly - 1-2 sets - 10 reps - Flexion-Extension Shoulder Pendulum with Table Support (Mirrored)  - 2-3 x daily - 7 x weekly - 1-2 sets - 10 reps - Horizontal Shoulder Pendulum with Table Support  - 2-3 x daily - 7 x weekly - 1-2 sets - 10 reps - Supported Elbow Flexion Extension PROM (Mirrored)  - 2-3 x daily - 7 x weekly - 1-2 sets - 10 reps   ASSESSMENT:   CLINICAL IMPRESSION: Assured Pt she was allowed to perform passive movement from HEP at home between visits (had not be doing so).  Improved tolerance to passive movement noted as documented in objective data today.  Pain still primary limit on motion with no joint mobility restriction noted in current available range. Continued skilled PT services indicated at this time to address pain and passive mobility.      OBJECTIVE IMPAIRMENTS decreased activity tolerance, decreased coordination, decreased endurance, decreased mobility, decreased ROM, decreased strength, hypomobility, increased edema, increased fascial restrictions, impaired perceived functional ability, impaired flexibility, impaired UE functional  use, improper body mechanics, and pain.    ACTIVITY LIMITATIONS  carrying, lifting, bed mobility, bathing, toileting, dressing, reach over head, hygiene/grooming, and locomotion level   PARTICIPATION LIMITATIONS: meal prep, cleaning, laundry, interpersonal relationship, community activity, occupation, and yard work   PERSONAL FACTORS  Chronic back pain, CKD, COPD, CAD, depression, DM, GERD  are also affecting patient's functional outcome.    REHAB POTENTIAL: Good   CLINICAL DECISION MAKING: Stable/uncomplicated   EVALUATION COMPLEXITY: Low     GOALS: Goals reviewed with patient? Yes   Short term PT Goals (target date for Short term goals are 3 weeks 01/16/2022) Patient will demonstrate independent use of home exercise program to maintain progress from in clinic treatments. Goal status: on going - assessed 01/02/2022   Long term PT goals (target dates for all long term goals are 10 weeks  03/06/2022 )   1. Patient will demonstrate/report pain at worst less than or equal to 2/10 to facilitate minimal limitation in daily activity secondary to pain symptoms. Goal status: on going - assessed 01/02/2022   2. Patient will demonstrate independent use of home exercise program to facilitate ability to maintain/progress functional gains from skilled physical therapy services. Goal status: on going - assessed 01/02/2022   3. Patient will demonstrate FOTO outcome > or = 52 % to indicate reduced disability due to condition. Goal status: on going - assessed 01/02/2022   4.  Patient will demonstrate Rt [place] joint mobility WFL to facilitate usual self care, dressing, reaching overhead at PLOF s limitation due to symptoms.  Goal status: on going - assessed 01/02/2022   5.  Patient will demonstrate Rt UE MMT 4/5 or greater throughout to facilitate usual lifting, carrying in functional activity to PLOF s limitation.   Goal status: on going - assessed 01/02/2022   6.  Patient will demonstrate ability to sleep s restriction due to symptom.  Goal status: on going - assessed  01/02/2022     PLAN: PT FREQUENCY:  2-3x/week , then 1-2x/week as able   PT DURATION: 10 weeks   PLANNED INTERVENTIONS: Therapeutic exercises, Therapeutic activity, Neuro Muscular re-education, Balance training, Gait training, Patient/Family education, Joint mobilization, Stair training, DME instructions, Dry Needling, Electrical stimulation, Cryotherapy, Moist heat, Taping, Ultrasound, Ionotophoresis '4mg'$ /ml Dexamethasone, and Manual therapy.  All included unless contraindicated   PLAN FOR NEXT SESSION: PROM only per PROTOCOL/referral from MD until 02/04/22, vaso/estim at end if desired.  Scot Jun, PT, DPT, OCS, ATC 01/02/22  10:44 AM

## 2022-01-03 ENCOUNTER — Other Ambulatory Visit: Payer: Self-pay | Admitting: Family

## 2022-01-03 ENCOUNTER — Other Ambulatory Visit: Payer: Self-pay | Admitting: Internal Medicine

## 2022-01-03 DIAGNOSIS — I10 Essential (primary) hypertension: Secondary | ICD-10-CM

## 2022-01-03 DIAGNOSIS — I25118 Atherosclerotic heart disease of native coronary artery with other forms of angina pectoris: Secondary | ICD-10-CM

## 2022-01-04 ENCOUNTER — Encounter: Payer: Self-pay | Admitting: Hematology

## 2022-01-04 ENCOUNTER — Other Ambulatory Visit: Payer: Self-pay

## 2022-01-04 MED ORDER — LOSARTAN POTASSIUM 50 MG PO TABS
ORAL_TABLET | Freq: Every day | ORAL | 2 refills | Status: DC
  Filled 2022-01-04: qty 45, 30d supply, fill #0
  Filled 2022-02-20: qty 45, 30d supply, fill #1
  Filled 2022-03-22: qty 45, 30d supply, fill #2

## 2022-01-09 ENCOUNTER — Ambulatory Visit (INDEPENDENT_AMBULATORY_CARE_PROVIDER_SITE_OTHER): Payer: Medicare Other | Admitting: Rehabilitative and Restorative Service Providers"

## 2022-01-09 ENCOUNTER — Encounter: Payer: Self-pay | Admitting: Rehabilitative and Restorative Service Providers"

## 2022-01-09 DIAGNOSIS — M25611 Stiffness of right shoulder, not elsewhere classified: Secondary | ICD-10-CM

## 2022-01-09 DIAGNOSIS — M25511 Pain in right shoulder: Secondary | ICD-10-CM

## 2022-01-09 DIAGNOSIS — G8929 Other chronic pain: Secondary | ICD-10-CM | POA: Diagnosis not present

## 2022-01-09 DIAGNOSIS — M6281 Muscle weakness (generalized): Secondary | ICD-10-CM

## 2022-01-09 DIAGNOSIS — R600 Localized edema: Secondary | ICD-10-CM

## 2022-01-09 NOTE — Therapy (Signed)
OUTPATIENT PHYSICAL THERAPY TREATMENT NOTE   Patient Name: [name] [name] MRN: [id number] DOB:1955-09-17, 66 y.o., female Today's Date: 01/09/2022   END OF SESSION:   PT End of Session - 01/09/22 1259     Visit Number 5    Number of Visits 20    Date for PT Re-Evaluation 03/06/22    Authorization Type Medicare    Progress Note Due on Visit 10    PT Start Time 1259    PT Stop Time 1334    PT Time Calculation (min) 35 min    Activity Tolerance Patient limited by pain    Behavior During Therapy [facility] for tasks assessed/performed               Past Medical History:  Diagnosis Date   Allergy    seasonal   Anemia    Anxiety    takes Lexapro daily   Arthritis    "back, from neck down pass my bra area" (03/25/2017)   Asthma    Bartholin gland cyst 08/29/2011   Bruises easily    pt is on Effient   Chronic back pain    herniated nucleus pulposus   Chronic back pain    "neck to bra area; lower back" (03/25/2017)   Chronic kidney disease    recurrent UTI's this year 2022   COPD (chronic obstructive pulmonary disease) (HCC)    early stages   Coronary artery disease    Depression    takes Klonopin daily   Diabetes mellitus without complication ([place])    Diverticulosis    Fibroadenoma of left breast    GERD (gastroesophageal reflux disease)    takes Nexium daily   H/O hiatal hernia    Heart attack ([place]) 2011   Hemorrhoids    Hernia    Hyperlipidemia    takes Lipitor daily   Hypertension    takes Losartan daily and Labetalol bid   Hypothyroidism    takes Synthroid daily   Insomnia    hydroxyzine prn   Joint pain    Pneumonia    "couple times" (03/25/2017)   Pre-diabetes    "just found out 1 wk ago" (03/25/2017)   Psoriasis    elbows,knees,back   Shortness of breath    with exertion   Slowing of urinary stream    Stress incontinence    Past Surgical History:  Procedure Laterality Date   [place]    "left one of my ovaries"   BACK SURGERY     BIOPSY  05/27/2019   Procedure: BIOPSY;  Surgeon: Daneil Dolin, MD;  [facility];  Service: Endoscopy;;   COLONOSCOPY  2011   Dr. Ardis Hughs: Mild diverticulosis, descending diminutive colon polyp (not retrieved), next colonoscopy 10 years   CORONARY ANGIOPLASTY WITH STENT PLACEMENT  2011 X2   "regular stents didn't work; had to go back in in ~ 1 month and put in medicated stents"   DILATION AND CURETTAGE OF UTERUS     ESOPHAGOGASTRODUODENOSCOPY     ESOPHAGOGASTRODUODENOSCOPY (EGD) WITH PROPOFOL N/A 05/27/2019   normal esophagus, dilation, erosive gastropathy s/p biopsy, normal duodenum. Negative H.pylori.    LEFT HEART CATH AND CORONARY ANGIOGRAPHY N/A 03/26/2017   Procedure: LEFT HEART CATH AND CORONARY ANGIOGRAPHY;  Surgeon: Belva Crome, MD;  Location: [place] [facility];  Service: Cardiovascular;  Laterality: N/A;   LUMBAR LAMINECTOMY/DECOMPRESSION MICRODISCECTOMY  08/05/2011   Procedure: LUMBAR LAMINECTOMY/DECOMPRESSION MICRODISCECTOMY;  Surgeon:  Otilio Connors, MD;  Location: [place] [facility];  Service: Neurosurgery;  Laterality: Right;  Right Lumbar four-five extraforaminal discectomy   MALONEY DILATION N/A 05/27/2019   Procedure: Venia Minks DILATION;  Surgeon: Daneil Dolin, MD;  [facility];  Service: Endoscopy;  Laterality: N/A;  54   SHOULDER ARTHROSCOPY WITH ROTATOR CUFF REPAIR AND OPEN BICEPS TENODESIS Right 12/24/2021   Procedure: RIGHT SHOULDER ARTHROSCOPY WITH ROTATOR CUFF REPAIR AND BICEPS TENODESIS;  Surgeon: Marybelle Killings, MD;  Location: [place];  Service: Orthopedics;  Laterality: Right;   TONSILLECTOMY     as a child   UPPER GASTROINTESTINAL ENDOSCOPY     Patient Active Problem List   Diagnosis Date Noted   Nontraumatic complete tear of right rotator cuff    Superior glenoid labrum lesion of left shoulder    Iron deficiency anemia 11/02/2021   Major depressive disorder, recurrent, in full remission with  anxious distress ([place]) 05/31/2021   Rotator cuff tendinitis, right 05/08/2021   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   Chronic neck pain 02/26/2021   GAD (generalized anxiety disorder) 01/08/2021   Acute cystitis without hematuria 11/07/2020   Influenza vaccine needed 06/15/2020   Abdominal pain, epigastric 04/28/2019   Esophageal dysphagia 04/28/2019   Bipolar I disorder ([place]) 09/17/2018   Insomnia 10/28/2017   Prediabetes 05/28/2017   QT prolongation 03/25/2017   Encounter for screening mammogram for breast cancer 09/11/2015   Psoriasis 06/22/2015   COPD (chronic obstructive pulmonary disease) (HCC)GOLD E 05/18/2015   Anxiety and depression 07/10/2013   Essential hypertension, benign 06/07/2013   Dyslipidemia 06/07/2013   Asthma, chronic 06/07/2013   Emphysema lung ([place]) 06/07/2013   Chronic back pain 06/07/2013   CAD S/P percutaneous coronary angioplasty 06/07/2013   GERD (gastroesophageal reflux disease) 06/07/2013   Breast lump on left side at 3 o'clock position 10/13/2012   S/P abdominal hysterectomy and right salpingo-oophorectomy 08/29/2011   TOBACCO ABUSE 07/17/2009   Chronic rhinitis 07/17/2009   Lung nodule < 6cm on CT 07/17/2009   ALLERGY, FOOD 07/17/2009   Hypothyroidism 12/22/2008     THERAPY DIAG:  Chronic right shoulder pain  Muscle weakness (generalized)  Stiffness of right shoulder, not elsewhere classified  Localized edema  PCP: Troy Sine MD   REFERRING PROVIDER: Marybelle Killings, MD   REFERRING DIAG: M75.81 (ICD-10-CM) - Rotator cuff tendinitis, right   Rationale for Evaluation and Treatment Rehabilitation   ONSET DATE: Surgery 12/24/2021   SUBJECTIVE:  SUBJECTIVE STATEMENT: Pt indicated she has been doing the HEP and felt like its getting better.     PERTINENT HISTORY: Chronic back pain, CKD, COPD, CAD, depression, DM, GERD   PAIN:  NPRS scale: 0/10 pain at rest upon arrival.  Still can get higher with movement.   Pain location: Rt shoulder Pain description: throbbing, burning.  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    OBJECTIVE:    PATIENT SURVEYS:   12/26/2021: FOTO intake:  20  predicted:  52   COGNITION: 12/26/2021 Overall cognitive status: Within functional limits for tasks assessed                                     SENSATION: 12/26/2021 WFL   POSTURE: 12/26/2021 Rounded shoulder, forward head posture.  Rt arm in sling upon arrival              Localized edema Rt shoulder visibly    UPPER EXTREMITY ROM:    ROM Right 12/26/2021 Passive in supine - All limited by pain Left 12/26/2021 Right 01/02/2022  PROM - limited by pain response Right 01/09/2022  PROM supine  Shoulder flexion 10   100 in supine 125 in spuine   Shoulder extension        Shoulder abduction 5   90 in supine   Shoulder adduction        Shoulder internal rotation To belly With arm at side      Shoulder external rotation -20 With arm at side   0 in 30 deg abduction  42 in 30 deg abduction supine  Elbow flexion WFL passively      Elbow extension -20 deg c pain in shoulder passively      Wrist flexion        Wrist extension        Wrist ulnar deviation        Wrist radial deviation        Wrist pronation        Wrist supination        (Blank rows = not tested)   UPPER EXTREMITY MMT:                         12/26/2021:  not tested due to protocol for surgery - Rt shoulder MMT Right   Left    Shoulder flexion      Shoulder extension       Shoulder abduction      Shoulder adduction      Shoulder internal rotation      Shoulder external rotation      Middle trapezius      Lower trapezius      Elbow flexion      Elbow extension      Wrist flexion      Wrist extension      Wrist ulnar deviation      Wrist radial deviation      Wrist pronation      Wrist supination      Grip strength (lbs)      (Blank rows =  not tested)   SHOULDER SPECIAL TESTS:             12/26/2021: none tested today   JOINT MOBILITY TESTING:  12/26/2021 heavy muscle guarding and pain limited full assessment for Rt shoulder.    PALPATION:  6/14/2023Tenderness throughout Rt shoulder region in muscles and joint, incision sites.               TODAY'S TREATMENT:  01/09/2022 Manual therapy:  Rt shoulder PROM gentle to tolerance in flexion, ER, ABD.  G1-g2 mid range mobilizations to Rt GH joint.  Lat contraction/relax for elevation gains (good change noted)  Modalities:  Vasopnuematic device X 10 min to Rt shoulder, 34 deg, low compression   01/02/22 Therex:  Seated scapular retraction 5 sec hold x 10   Seated elbow self  flexion/extension PROM c Lt arm moving Rt x 10  Seated pendulums c Lt arm supporting on Rt elbow flexion to tolerance x 10, add/abd to tolerance x 10, circles x 10   Review of HEP for home use (assured Pt she could do these at home safely)  Manual therapy:  Rt shoulder PROM gentle to tolerance in flexion, ER, ABD.  G1-g2 mid range mobilizations to Rt [facility] joint  Modalities:  Vasopnuematic device X 10 min to Rt shoulder, 34 deg, low compression  Pre mod Rt shoulder joint 10 mins to tolerance level with vaso use.   12/28/21 Therex:  Seated arm supported scapular retractions X 20  Seated posterior shoulder rolls 2X10  Seated elbow self  flexion/extension PROM with hands clasped X 20  Seated gripping towel X 20 in pronation, and X 20 in neutral  Seated pendulums PROM leaning over and hands clasped X 20 each for circles CW, CCW,  A-P, lateral  Manual therapy:  Rt shoulder PROM gentle to tolerance in flexion, ER, ABD planes  Modalities:  Vasopnuematic device X 10 min to Rt shoulder, 34 deg, low compression  12/27/21 Therex:  Seated arm supported scapular retractions X 15  Seated gripping towel X 15 in pronation, and X 15 in neutral  Seated pendulums PROM X 15 each for circles CW, CCW, A-P, lateral  Manual therapy:  Rt shoulder PROM gentle to tolerance in flexion, ER, ABD planes  Modalities:  Vasopnuematic device X 10 min to Rt shoulder, 34 deg, low compression      PATIENT EDUCATION: 12/26/2021 Education details: HEP, POC Person educated: Patient Education method: Consulting civil engineer, Media planner, Verbal cues, and Handouts Education comprehension: verbalized understanding, returned demonstration, and verbal cues required     HOME EXERCISE PROGRAM: Access Code: [email or web address] URL: [email or web address] Date: 12/26/2021 Prepared by: Scot Jun   Exercises - Seated Scapular Retraction  - 3-5 x daily - 7 x weekly - 1 sets - 10 reps - 3-5 hold - Circular Shoulder Pendulum with Table Support  - 2-3 x daily - 7 x weekly - 1-2 sets - 10 reps - Flexion-Extension Shoulder Pendulum with Table Support (Mirrored)  - 2-3 x daily - 7 x weekly - 1-2 sets - 10 reps - Horizontal Shoulder Pendulum with Table Support  - 2-3 x daily - 7 x weekly - 1-2 sets - 10 reps - Supported Elbow Flexion Extension PROM (Mirrored)  - 2-3 x daily - 7 x weekly - 1-2 sets - 10 reps   ASSESSMENT:   CLINICAL IMPRESSION: Noted improvement in tolerance to passive mobility in last visit or two with maintaining gains from visit to visit and progression each time.  Noted lat restriction in  mobility for elevation in addition to capsular tightness that may be present.      OBJECTIVE IMPAIRMENTS decreased activity tolerance, decreased coordination, decreased endurance, decreased mobility, decreased ROM, decreased strength, hypomobility,  increased edema, increased fascial restrictions, impaired perceived functional ability, impaired flexibility, impaired UE functional use, improper body mechanics, and pain.    ACTIVITY LIMITATIONS carrying, lifting, bed mobility, bathing, toileting, dressing, reach over head, hygiene/grooming, and locomotion level   PARTICIPATION LIMITATIONS: meal prep, cleaning, laundry, interpersonal relationship, community activity, occupation, and yard work   PERSONAL FACTORS  Chronic back pain, CKD, COPD, CAD, depression, DM, GERD  are also affecting patient's functional outcome.    REHAB POTENTIAL: Good   CLINICAL DECISION MAKING: Stable/uncomplicated   EVALUATION COMPLEXITY: Low     GOALS: Goals reviewed with patient? Yes   Short term PT Goals (target date for Short term goals are 3 weeks 01/16/2022) Patient will demonstrate independent use of home exercise program to maintain progress from in clinic treatments. Goal status: on going - assessed 01/02/2022   Long term PT goals (target dates for all long term goals are 10 weeks  03/06/2022 )   1. Patient will demonstrate/report pain at worst less than or equal to 2/10 to facilitate minimal limitation in daily activity secondary to pain symptoms. Goal status: on going - assessed 01/02/2022   2. Patient will demonstrate independent use of home exercise program to facilitate ability to maintain/progress functional gains from skilled physical therapy services. Goal status: on going - assessed 01/02/2022   3. Patient will demonstrate FOTO outcome > or = 52 % to indicate reduced disability due to condition. Goal status: on going - assessed 01/02/2022   4.  Patient will demonstrate Rt [place] joint mobility WFL to facilitate usual self care, dressing, reaching overhead at PLOF s limitation due to symptoms.  Goal status: on going - assessed 01/02/2022   5.  Patient will demonstrate Rt UE MMT 4/5 or greater throughout to facilitate usual lifting, carrying in  functional activity to PLOF s limitation.   Goal status: on going - assessed 01/02/2022   6.  Patient will demonstrate ability to sleep s restriction due to symptom.  Goal status: on going - assessed 01/02/2022     PLAN: PT FREQUENCY:  2-3x/week , then 1-2x/week as able   PT DURATION: 10 weeks   PLANNED INTERVENTIONS: Therapeutic exercises, Therapeutic activity, Neuro Muscular re-education, Balance training, Gait training, Patient/Family education, Joint mobilization, Stair training, DME instructions, Dry Needling, Electrical stimulation, Cryotherapy, Moist heat, Taping, Ultrasound, Ionotophoresis '4mg'$ /ml Dexamethasone, and Manual therapy.  All included unless contraindicated   PLAN FOR NEXT SESSION: PROM progression only per PROTOCOL/referral from MD until 02/04/22, vaso/estim at end if desired.  Scot Jun, PT, DPT, OCS, ATC 01/09/22  1:25 PM

## 2022-01-11 ENCOUNTER — Encounter: Payer: Self-pay | Admitting: Rehabilitative and Restorative Service Providers"

## 2022-01-11 ENCOUNTER — Ambulatory Visit (INDEPENDENT_AMBULATORY_CARE_PROVIDER_SITE_OTHER): Payer: Medicare Other | Admitting: Rehabilitative and Restorative Service Providers"

## 2022-01-11 DIAGNOSIS — M6281 Muscle weakness (generalized): Secondary | ICD-10-CM | POA: Diagnosis not present

## 2022-01-11 DIAGNOSIS — M25511 Pain in right shoulder: Secondary | ICD-10-CM

## 2022-01-11 DIAGNOSIS — M25611 Stiffness of right shoulder, not elsewhere classified: Secondary | ICD-10-CM

## 2022-01-11 DIAGNOSIS — R600 Localized edema: Secondary | ICD-10-CM

## 2022-01-11 DIAGNOSIS — G8929 Other chronic pain: Secondary | ICD-10-CM

## 2022-01-11 NOTE — Therapy (Signed)
OUTPATIENT PHYSICAL THERAPY TREATMENT NOTE   Patient Name: [name] [name] MRN: [id number] DOB:1955-08-13, 66 y.o., female Today's Date: 01/11/2022  END OF SESSION:   PT End of Session - 01/11/22 1508     Visit Number 6    Number of Visits 20    Date for PT Re-Evaluation 03/06/22    Authorization Type Medicare    Progress Note Due on Visit 10    PT Start Time 1607    PT Stop Time 1513    PT Time Calculation (min) 45 min    Activity Tolerance Patient tolerated treatment well;No increased pain    Behavior During Therapy WFL for tasks assessed/performed                Past Medical History:  Diagnosis Date   Allergy    seasonal   Anemia    Anxiety    takes Lexapro daily   Arthritis    "back, from neck down pass my bra area" (03/25/2017)   Asthma    Bartholin gland cyst 08/29/2011   Bruises easily    pt is on Effient   Chronic back pain    herniated nucleus pulposus   Chronic back pain    "neck to bra area; lower back" (03/25/2017)   Chronic kidney disease    recurrent UTI's this year 2022   COPD (chronic obstructive pulmonary disease) (HCC)    early stages   Coronary artery disease    Depression    takes Klonopin daily   Diabetes mellitus without complication ([place])    Diverticulosis    Fibroadenoma of left breast    GERD (gastroesophageal reflux disease)    takes Nexium daily   H/O hiatal hernia    Heart attack ([place]) 2011   Hemorrhoids    Hernia    Hyperlipidemia    takes Lipitor daily   Hypertension    takes Losartan daily and Labetalol bid   Hypothyroidism    takes Synthroid daily   Insomnia    hydroxyzine prn   Joint pain    Pneumonia    "couple times" (03/25/2017)   Pre-diabetes    "just found out 1 wk ago" (03/25/2017)   Psoriasis    elbows,knees,back   Shortness of breath    with exertion   Slowing of urinary stream    Stress incontinence    Past Surgical History:  Procedure Laterality Date   [place]   "left one of my ovaries"   BACK SURGERY     BIOPSY  05/27/2019   Procedure: BIOPSY;  Surgeon: Daneil Dolin, MD;  Location: [facility];  Service: Endoscopy;;   COLONOSCOPY  2011   Dr. Ardis Hughs: Mild diverticulosis, descending diminutive colon polyp (not retrieved), next colonoscopy 10 years   CORONARY ANGIOPLASTY WITH STENT PLACEMENT  2011 X2   "regular stents didn't work; had to go back in in ~ 1 month and put in medicated stents"   DILATION AND CURETTAGE OF UTERUS     ESOPHAGOGASTRODUODENOSCOPY     ESOPHAGOGASTRODUODENOSCOPY (EGD) WITH PROPOFOL N/A 05/27/2019   normal esophagus, dilation, erosive gastropathy s/p biopsy, normal duodenum. Negative H.pylori.    LEFT HEART CATH AND CORONARY ANGIOGRAPHY N/A 03/26/2017   Procedure: LEFT HEART CATH AND CORONARY ANGIOGRAPHY;  Surgeon: Belva Crome, MD;  Location: [facility];  Service: Cardiovascular;  Laterality: N/A;   LUMBAR LAMINECTOMY/DECOMPRESSION MICRODISCECTOMY  08/05/2011   Procedure: LUMBAR LAMINECTOMY/DECOMPRESSION MICRODISCECTOMY;  Surgeon: Otilio Connors, MD;  Location: [facility];  Service: Neurosurgery;  Laterality: Right;  Right Lumbar four-five extraforaminal discectomy   MALONEY DILATION N/A 05/27/2019   Procedure: Venia Minks DILATION;  Surgeon: Daneil Dolin, MD;  Location: [facility];  Service: Endoscopy;  Laterality: N/A;  54   SHOULDER ARTHROSCOPY WITH ROTATOR CUFF REPAIR AND OPEN BICEPS TENODESIS Right 12/24/2021   Procedure: RIGHT SHOULDER ARTHROSCOPY WITH ROTATOR CUFF REPAIR AND BICEPS TENODESIS;  Surgeon: Marybelle Killings, MD;  Location: [facility];  Service: Orthopedics;  Laterality: Right;   TONSILLECTOMY     as a child   UPPER GASTROINTESTINAL ENDOSCOPY     Patient Active Problem List   Diagnosis Date Noted   Nontraumatic complete tear of right rotator cuff    Superior glenoid labrum lesion of left shoulder    Iron deficiency anemia 11/02/2021   Major depressive disorder,  recurrent, in full remission with anxious distress ([place]) 05/31/2021   Rotator cuff tendinitis, right 05/08/2021   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   Chronic neck pain 02/26/2021   GAD (generalized anxiety disorder) 01/08/2021   Acute cystitis without hematuria 11/07/2020   Influenza vaccine needed 06/15/2020   Abdominal pain, epigastric 04/28/2019   Esophageal dysphagia 04/28/2019   Bipolar I disorder ([place]) 09/17/2018   Insomnia 10/28/2017   Prediabetes 05/28/2017   QT prolongation 03/25/2017   Encounter for screening mammogram for breast cancer 09/11/2015   Psoriasis 06/22/2015   COPD (chronic obstructive pulmonary disease) (HCC)GOLD E 05/18/2015   Anxiety and depression 07/10/2013   Essential hypertension, benign 06/07/2013   Dyslipidemia 06/07/2013   Asthma, chronic 06/07/2013   Emphysema lung ([place]) 06/07/2013   Chronic back pain 06/07/2013   CAD S/P percutaneous coronary angioplasty 06/07/2013   GERD (gastroesophageal reflux disease) 06/07/2013   Breast lump on left side at 3 o'clock position 10/13/2012   S/P abdominal hysterectomy and right salpingo-oophorectomy 08/29/2011   TOBACCO ABUSE 07/17/2009   Chronic rhinitis 07/17/2009   Lung nodule < 6cm on CT 07/17/2009   ALLERGY, FOOD 07/17/2009   Hypothyroidism 12/22/2008     THERAPY DIAG:  Chronic right shoulder pain  Muscle weakness (generalized)  Stiffness of right shoulder, not elsewhere classified  Localized edema  PCP: Troy Sine MD   REFERRING PROVIDER: Marybelle Killings, MD   REFERRING DIAG: M75.81 (ICD-10-CM) - Rotator cuff tendinitis, right   Rationale for Evaluation and Treatment Rehabilitation   ONSET DATE: Surgery 12/24/2021   SUBJECTIVE:  SUBJECTIVE STATEMENT: [name] is sleeping about 4 hours of sleep  uninterrupted, even without pain meds last night.  She reports 1X/day HEP compliance (recommended 5X a day).   PERTINENT HISTORY: Chronic back pain, CKD, COPD, CAD, depression, DM, GERD   PAIN:  NPRS scale: 0/10 pain at rest upon arrival.  Still can get higher (3/10) with movement.   Pain location: Rt shoulder Pain description: throbbing, burning.  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    OBJECTIVE:    PATIENT SURVEYS:   12/26/2021: FOTO intake:  20  predicted:  52   COGNITION: 12/26/2021 Overall cognitive status: Within functional limits for tasks assessed                                     SENSATION: 12/26/2021 WFL   POSTURE: 12/26/2021 Rounded shoulder, forward head posture.  Rt arm in sling upon arrival              Localized edema Rt shoulder visibly    UPPER EXTREMITY ROM:    ROM Right 12/26/2021 Passive in supine - All limited by pain Left 12/26/2021 Right 01/02/2022  PROM - limited by pain response Right 01/09/2022  PROM supine PROM Right 01/11/2022 assessed supine  Shoulder flexion 10   100 in supine 125 in spuine  115 supine  Shoulder extension         Shoulder abduction 5   90 in supine    Shoulder adduction         Shoulder internal rotation To belly With arm at side     55 degrees assessed at 70 degrees abduction  Shoulder external rotation -20 With arm at side   0 in 30 deg abduction  42 in 30 deg abduction supine 50 degrees assessed at 70 degrees abduction  Elbow flexion WFL passively       Elbow extension -20 deg c pain in shoulder passively       Wrist flexion         Wrist extension         Wrist ulnar deviation         Wrist radial deviation          Wrist pronation         Wrist supination         (Blank rows = not tested)   UPPER EXTREMITY MMT:                         12/26/2021:  not tested due to protocol for surgery - Rt shoulder MMT Right   Left    Shoulder flexion      Shoulder extension      Shoulder abduction      Shoulder adduction      Shoulder internal rotation      Shoulder external rotation      Middle trapezius      Lower trapezius      Elbow flexion      Elbow extension      Wrist flexion      Wrist extension  Wrist ulnar deviation      Wrist radial deviation      Wrist pronation      Wrist supination      Grip strength (lbs)      (Blank rows = not tested)   SHOULDER SPECIAL TESTS:             12/26/2021: none tested today   JOINT MOBILITY TESTING:  12/26/2021 heavy muscle guarding and pain limited full assessment for Rt shoulder.    PALPATION:  6/14/2023Tenderness throughout Rt shoulder region in muscles and joint, incision sites.               TODAY'S TREATMENT:  01/11/2022 Manual therapy: PROM Flexion, IR and ER 30-32 minutes with PT within comfortable range  Therapeutic Exercises: Shoulder blade pinches 10X 5 seconds  Codmans Forward/Backward; CW; CCW 20X each (needed coaching for correct technique)  Ice pack 5 minutes post-exercises   01/09/2022 Manual therapy:  Rt shoulder PROM gentle to tolerance in flexion, ER, ABD.  G1-g2 mid range mobilizations to Rt GH joint.  Lat contraction/relax for elevation gains (good change noted)  Modalities:  Vasopnuematic device X 10 min to Rt shoulder, 34 deg, low compression    01/02/22 Therex:  Seated scapular retraction 5 sec hold x 10   Seated elbow self  flexion/extension PROM c Lt arm moving Rt x 10  Seated pendulums c Lt arm supporting on Rt elbow flexion to tolerance x 10, add/abd to tolerance x 10, circles x 10   Review of HEP for home use (assured Pt she could do these at home safely)  Manual therapy:  Rt shoulder PROM gentle  to tolerance in flexion, ER, ABD.  G1-g2 mid range mobilizations to Rt [facility] joint  Modalities:  Vasopnuematic device X 10 min to Rt shoulder, 34 deg, low compression  Pre mod Rt shoulder joint 10 mins to tolerance level with vaso use.      PATIENT EDUCATION: 12/26/2021 Education details: HEP, POC Person educated: Patient Education method: Consulting civil engineer, Demonstration, Verbal cues, and Handouts Education comprehension: verbalized understanding, returned demonstration, and verbal cues required     HOME EXERCISE PROGRAM: Access Code: [email or web address] URL: [email or web address] Date: 12/26/2021 Prepared by: Scot Jun   Exercises - Seated Scapular Retraction  - 3-5 x daily - 7 x weekly - 1 sets - 10 reps - 3-5 hold - Circular Shoulder Pendulum with Table Support  - 2-3 x daily - 7 x weekly - 1-2 sets - 10 reps - Flexion-Extension Shoulder Pendulum with Table Support (Mirrored)  - 2-3 x daily - 7 x weekly - 1-2 sets - 10 reps - Horizontal Shoulder Pendulum with Table Support  - 2-3 x daily - 7 x weekly - 1-2 sets - 10 reps - Supported Elbow Flexion Extension PROM (Mirrored)  - 2-3 x daily - 7 x weekly - 1-2 sets - 10 reps   ASSESSMENT:   CLINICAL IMPRESSION: Marka couldn't find her prescription pain meds last night and was still able to get about 4 hours uninterrupted of sleep.  PROM is acceptable for this point post-surgery even with less than perfect HEP compliance.  Miquel was encouraged to get her home exercises in 5X/day along with daily ice to help meet LTGs.     OBJECTIVE IMPAIRMENTS decreased activity tolerance, decreased coordination, decreased endurance, decreased mobility, decreased ROM, decreased strength, hypomobility, increased edema, increased fascial restrictions, impaired perceived functional ability, impaired flexibility, impaired UE functional use, improper body mechanics, and pain.    ACTIVITY LIMITATIONS  carrying, lifting, bed mobility, bathing, toileting,  dressing, reach over head, hygiene/grooming, and locomotion level   PARTICIPATION LIMITATIONS: meal prep, cleaning, laundry, interpersonal relationship, community activity, occupation, and yard work   PERSONAL FACTORS  Chronic back pain, CKD, COPD, CAD, depression, DM, GERD  are also affecting patient's functional outcome.    REHAB POTENTIAL: Good   CLINICAL DECISION MAKING: Stable/uncomplicated   EVALUATION COMPLEXITY: Low     GOALS: Goals reviewed with patient? Yes   Short term PT Goals (target date for Short term goals are 3 weeks 01/16/2022) Patient will demonstrate independent use of home exercise program to maintain progress from in clinic treatments. Goal status: On going - assessed 01/11/2022   Long term PT goals (target dates for all long term goals are 10 weeks  03/06/2022 )   1. Patient will demonstrate/report pain at worst less than or equal to 2/10 to facilitate minimal limitation in daily activity secondary to pain symptoms. Goal status: on going - assessed 01/11/2022   2. Patient will demonstrate independent use of home exercise program to facilitate ability to maintain/progress functional gains from skilled physical therapy services. Goal status: on going - assessed 01/11/2022   3. Patient will demonstrate FOTO outcome > or = 52 % to indicate reduced disability due to condition. Goal status: on going - assessed 01/02/2022   4.  Patient will demonstrate Rt [place] joint mobility WFL to facilitate usual self care, dressing, reaching overhead at PLOF s limitation due to symptoms.  Goal status: on going - assessed 01/02/2022   5.  Patient will demonstrate Rt UE MMT 4/5 or greater throughout to facilitate usual lifting, carrying in functional activity to PLOF s limitation.   Goal status: on going - assessed 01/02/2022   6.  Patient will demonstrate ability to sleep s restriction due to symptom.  Goal status: on going - assessed 01/11/2022     PLAN: PT FREQUENCY:  2-3x/week ,  then 1-2x/week as able   PT DURATION: 10 weeks   PLANNED INTERVENTIONS: Therapeutic exercises, Therapeutic activity, Neuro Muscular re-education, Balance training, Gait training, Patient/Family education, Joint mobilization, Stair training, DME instructions, Dry Needling, Electrical stimulation, Cryotherapy, Moist heat, Taping, Ultrasound, Ionotophoresis '4mg'$ /ml Dexamethasone, and Manual therapy.  All included unless contraindicated   PLAN FOR NEXT SESSION: PROM progression only per PROTOCOL/referral from MD until 02/04/22, vaso/estim at end if desired.  Farley Ly PT, MPT 01/11/22  3:14 PM

## 2022-01-16 ENCOUNTER — Encounter: Payer: Self-pay | Admitting: Physical Therapy

## 2022-01-16 ENCOUNTER — Ambulatory Visit (INDEPENDENT_AMBULATORY_CARE_PROVIDER_SITE_OTHER): Payer: Medicare Other | Admitting: Physical Therapy

## 2022-01-16 DIAGNOSIS — M6281 Muscle weakness (generalized): Secondary | ICD-10-CM | POA: Diagnosis not present

## 2022-01-16 DIAGNOSIS — R600 Localized edema: Secondary | ICD-10-CM | POA: Diagnosis not present

## 2022-01-16 DIAGNOSIS — G8929 Other chronic pain: Secondary | ICD-10-CM | POA: Diagnosis not present

## 2022-01-16 DIAGNOSIS — M25511 Pain in right shoulder: Secondary | ICD-10-CM | POA: Diagnosis not present

## 2022-01-16 DIAGNOSIS — M25611 Stiffness of right shoulder, not elsewhere classified: Secondary | ICD-10-CM | POA: Diagnosis not present

## 2022-01-16 NOTE — Therapy (Signed)
OUTPATIENT PHYSICAL THERAPY TREATMENT NOTE   Patient Name: [name] [name] MRN: [id number] DOB:[birth year], 66 y.o., female Today's Date: 01/16/2022  END OF SESSION:   PT End of Session - 01/16/22 0846     Visit Number 7    Number of Visits 20    Date for PT Re-Evaluation 03/06/22    Authorization Type Medicare    Progress Note Due on Visit 10    PT Start Time 0845    PT Stop Time 0918    PT Time Calculation (min) 33 min    Activity Tolerance Patient tolerated treatment well;No increased pain    Behavior During Therapy WFL for tasks assessed/performed                 Past Medical History:  Diagnosis Date   Allergy    seasonal   Anemia    Anxiety    takes Lexapro daily   Arthritis    "back, from neck down pass my bra area" (03/25/2017)   Asthma    Bartholin gland cyst 08/29/2011   Bruises easily    pt is on Effient   Chronic back pain    herniated nucleus pulposus   Chronic back pain    "neck to bra area; lower back" (03/25/2017)   Chronic kidney disease    recurrent UTI's this year 2022   COPD (chronic obstructive pulmonary disease) (HCC)    early stages   Coronary artery disease    Depression    takes Klonopin daily   Diabetes mellitus without complication ([place])    Diverticulosis    Fibroadenoma of left breast    GERD (gastroesophageal reflux disease)    takes Nexium daily   H/O hiatal hernia    Heart attack ([place]) 2011   Hemorrhoids    Hernia    Hyperlipidemia    takes Lipitor daily   Hypertension    takes Losartan daily and Labetalol bid   Hypothyroidism    takes Synthroid daily   Insomnia    hydroxyzine prn   Joint pain    Pneumonia    "couple times" (03/25/2017)   Pre-diabetes    "just found out 1 wk ago" (03/25/2017)   Psoriasis    elbows,knees,back   Shortness of breath    with exertion   Slowing of urinary stream    Stress incontinence    Past Surgical History:  Procedure Laterality Date   [place]   "left one of my ovaries"   BACK SURGERY     BIOPSY  05/27/2019   Procedure: BIOPSY;  Surgeon: Daneil Dolin, MD;  Location: [facility];  Service: Endoscopy;;   COLONOSCOPY  2011   Dr. Ardis Hughs: Mild diverticulosis, descending diminutive colon polyp (not retrieved), next colonoscopy 10 years   CORONARY ANGIOPLASTY WITH STENT PLACEMENT  2011 X2   "regular stents didn't work; had to go back in in ~ 1 month and put in medicated stents"   DILATION AND CURETTAGE OF UTERUS     ESOPHAGOGASTRODUODENOSCOPY     ESOPHAGOGASTRODUODENOSCOPY (EGD) WITH PROPOFOL N/A 05/27/2019   normal esophagus, dilation, erosive gastropathy s/p biopsy, normal duodenum. Negative H.pylori.    LEFT HEART CATH AND CORONARY ANGIOGRAPHY N/A 03/26/2017   Procedure: LEFT HEART CATH AND CORONARY ANGIOGRAPHY;  Surgeon: Belva Crome, MD;  Location: [facility];  Service: Cardiovascular;  Laterality: N/A;   LUMBAR LAMINECTOMY/DECOMPRESSION MICRODISCECTOMY  08/05/2011   Procedure: LUMBAR LAMINECTOMY/DECOMPRESSION  MICRODISCECTOMY;  Surgeon: Otilio Connors, MD;  Location: [facility];  Service: Neurosurgery;  Laterality: Right;  Right Lumbar four-five extraforaminal discectomy   MALONEY DILATION N/A 05/27/2019   Procedure: Venia Minks DILATION;  Surgeon: Daneil Dolin, MD;  Location: [facility];  Service: Endoscopy;  Laterality: N/A;  54   SHOULDER ARTHROSCOPY WITH ROTATOR CUFF REPAIR AND OPEN BICEPS TENODESIS Right 12/24/2021   Procedure: RIGHT SHOULDER ARTHROSCOPY WITH ROTATOR CUFF REPAIR AND BICEPS TENODESIS;  Surgeon: Marybelle Killings, MD;  Location: [facility];  Service: Orthopedics;  Laterality: Right;   TONSILLECTOMY     as a child   UPPER GASTROINTESTINAL ENDOSCOPY     Patient Active Problem List   Diagnosis Date Noted   Nontraumatic complete tear of right rotator cuff    Superior glenoid labrum lesion of left shoulder    Iron deficiency anemia 11/02/2021   Major depressive disorder,  recurrent, in full remission with anxious distress ([place]) 05/31/2021   Rotator cuff tendinitis, right 05/08/2021   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   Chronic neck pain 02/26/2021   GAD (generalized anxiety disorder) 01/08/2021   Acute cystitis without hematuria 11/07/2020   Influenza vaccine needed 06/15/2020   Abdominal pain, epigastric 04/28/2019   Esophageal dysphagia 04/28/2019   Bipolar I disorder ([place]) 09/17/2018   Insomnia 10/28/2017   Prediabetes 05/28/2017   QT prolongation 03/25/2017   Encounter for screening mammogram for breast cancer 09/11/2015   Psoriasis 06/22/2015   COPD (chronic obstructive pulmonary disease) (HCC)GOLD E 05/18/2015   Anxiety and depression 07/10/2013   Essential hypertension, benign 06/07/2013   Dyslipidemia 06/07/2013   Asthma, chronic 06/07/2013   Emphysema lung ([place]) 06/07/2013   Chronic back pain 06/07/2013   CAD S/P percutaneous coronary angioplasty 06/07/2013   GERD (gastroesophageal reflux disease) 06/07/2013   Breast lump on left side at 3 o'clock position 10/13/2012   S/P abdominal hysterectomy and right salpingo-oophorectomy 08/29/2011   TOBACCO ABUSE 07/17/2009   Chronic rhinitis 07/17/2009   Lung nodule < 6cm on CT 07/17/2009   ALLERGY, FOOD 07/17/2009   Hypothyroidism 12/22/2008     THERAPY DIAG:  Chronic right shoulder pain  Muscle weakness (generalized)  Stiffness of right shoulder, not elsewhere classified  Localized edema  PCP: Troy Sine MD   REFERRING PROVIDER: Marybelle Killings, MD   REFERRING DIAG: M75.81 (ICD-10-CM) - Rotator cuff tendinitis, right   Rationale for Evaluation and Treatment Rehabilitation   ONSET DATE: Surgery 12/24/2021   SUBJECTIVE:  SUBJECTIVE STATEMENT: Was out yesterday for the 4th and shoulder  is more sore today.     PERTINENT HISTORY: Chronic back pain, CKD, COPD, CAD, depression, DM, GERD   PAIN:  NPRS scale: no pain, just sore/10 pain at rest upon arrival.   Pain location: Rt shoulder Pain description: throbbing, burning.  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    OBJECTIVE:    PATIENT SURVEYS:   12/26/2021: FOTO intake:  20  predicted:  52   POSTURE: 12/26/2021 Rounded shoulder, forward head posture.  Rt arm in sling upon arrival              Localized edema Rt shoulder visibly    UPPER EXTREMITY ROM:    ROM Right 12/26/2021 Passive in supine - All limited by pain Right 01/02/2022  PROM - limited by pain response Right 01/09/2022  PROM supine PROM Right 01/11/2022 assessed supine  Shoulder flexion 10 100 in supine 125 in spuine  115 supine  Shoulder extension       Shoulder abduction 5 90 in supine    Shoulder adduction       Shoulder internal rotation To belly With arm at side   55 degrees assessed at 70 degrees abduction  Shoulder external rotation -20 With arm at side 0 in 30 deg abduction  42 in 30 deg abduction supine 50 degrees assessed at 70 degrees abduction  Elbow flexion WFL passively     Elbow extension -20 deg c pain in shoulder passively     (Blank rows = not tested)   UPPER EXTREMITY MMT:                         12/26/2021:  not tested due to protocol for surgery - Rt shoulder MMT Right   Left    Shoulder flexion      Shoulder extension      Shoulder abduction      Shoulder adduction      Shoulder internal rotation      Shoulder external rotation      Middle trapezius      Lower trapezius      Elbow flexion      Elbow  extension      Wrist flexion      Wrist extension      Wrist ulnar deviation      Wrist radial deviation      Wrist pronation      Wrist supination      Grip strength (lbs)      (Blank rows = not tested)   SHOULDER SPECIAL TESTS:             12/26/2021: none tested today   JOINT MOBILITY TESTING:  12/26/2021 heavy muscle guarding and pain limited full assessment for Rt shoulder.    PALPATION:  6/14/2023Tenderness throughout Rt shoulder region in muscles and joint, incision sites.               TODAY'S TREATMENT:  01/16/22 Therex Scapular retraction x 20 reps sitting Shoulder rolls backward x 20 reps Pendulums lateral, ant/post and circles x 20 reps each Passive elbow extension stretch in supine x  3 min with manual below - encouraged pt to perform at home Demonstrated PROM of Rt shoulder with LUE assist - pt performed 3-5 reps of supine flexion  Manual therapy:  Rt shoulder PROM gentle to tolerance in flexion, ER, ABD.  G1-g2 mid range mobilizations to Rt GH joint.  STM to Rt biceps  01/11/2022 Manual therapy: PROM Flexion, IR and ER 30-32 minutes with PT within comfortable range  Therapeutic Exercises: Shoulder blade pinches 10X 5 seconds  Codmans Forward/Backward; CW; CCW 20X each (needed coaching for correct technique)  Ice pack 5 minutes post-exercises   01/09/2022 Manual therapy:  Rt shoulder PROM gentle to tolerance in flexion, ER, ABD.  G1-g2 mid range mobilizations to Rt GH joint.  Lat contraction/relax for elevation gains (good change noted)  Modalities:  Vasopnuematic device X 10 min to Rt shoulder, 34 deg, low compression    01/02/22 Therex:  Seated scapular retraction 5 sec hold x 10   Seated elbow self  flexion/extension PROM c Lt arm moving Rt x 10  Seated pendulums c Lt arm supporting on Rt elbow flexion to tolerance x 10, add/abd to tolerance x 10, circles x 10   Review of HEP for home use (assured Pt she could do these at home safely)  Manual  therapy:  Rt shoulder PROM gentle to tolerance in flexion, ER, ABD.  G1-g2 mid range mobilizations to Rt [facility] joint  Modalities:  Vasopnuematic device X 10 min to Rt shoulder, 34 deg, low compression  Pre mod Rt shoulder joint 10 mins to tolerance level with vaso use.      PATIENT EDUCATION: 12/26/2021 Education details: HEP, POC Person educated: Patient Education method: Consulting civil engineer, Demonstration, Verbal cues, and Handouts Education comprehension: verbalized understanding, returned demonstration, and verbal cues required     HOME EXERCISE PROGRAM: Access Code: [email or web address] URL: [email or web address] Date: 12/26/2021 Prepared by: Scot Jun   Exercises - Seated Scapular Retraction  - 3-5 x daily - 7 x weekly - 1 sets - 10 reps - 3-5 hold - Circular Shoulder Pendulum with Table Support  - 2-3 x daily - 7 x weekly - 1-2 sets - 10 reps - Flexion-Extension Shoulder Pendulum with Table Support (Mirrored)  - 2-3 x daily - 7 x weekly - 1-2 sets - 10 reps - Horizontal Shoulder Pendulum with Table Support  - 2-3 x daily - 7 x weekly - 1-2 sets - 10 reps - Supported Elbow Flexion Extension PROM (Mirrored)  - 2-3 x daily - 7 x weekly - 1-2 sets - 10 reps   ASSESSMENT:   CLINICAL IMPRESSION: Pt with difficulty achieving full elbow extension at this time so demonstrated passive stretch at home out of sling to help with maintaining range.  Will continue to benefit from PT to maximize function.  STG #1 met today.     OBJECTIVE IMPAIRMENTS decreased activity tolerance, decreased coordination, decreased endurance, decreased mobility, decreased ROM, decreased strength, hypomobility, increased edema, increased fascial restrictions, impaired perceived functional ability, impaired flexibility, impaired UE functional use, improper body mechanics, and pain.    ACTIVITY LIMITATIONS carrying, lifting, bed mobility, bathing, toileting, dressing, reach over head, hygiene/grooming, and  locomotion level   PARTICIPATION LIMITATIONS: meal prep, cleaning, laundry, interpersonal relationship, community activity, occupation, and yard work   PERSONAL FACTORS  Chronic back pain, CKD, COPD, CAD, depression, DM, GERD  are also affecting patient's functional outcome.    REHAB POTENTIAL: Good   CLINICAL DECISION MAKING: Stable/uncomplicated   EVALUATION COMPLEXITY: Low     GOALS:  Goals reviewed with patient? Yes   Short term PT Goals (target date for Short term goals are 3 weeks 01/16/2022) Patient will demonstrate independent use of home exercise program to maintain progress from in clinic treatments. Goal status: MET 01/16/22   Long term PT goals (target dates for all long term goals are 10 weeks  03/06/2022 )   1. Patient will demonstrate/report pain at worst less than or equal to 2/10 to facilitate minimal limitation in daily activity secondary to pain symptoms. Goal status: on going - assessed 01/11/2022   2. Patient will demonstrate independent use of home exercise program to facilitate ability to maintain/progress functional gains from skilled physical therapy services. Goal status: on going - assessed 01/11/2022   3. Patient will demonstrate FOTO outcome > or = 52 % to indicate reduced disability due to condition. Goal status: on going - assessed 01/02/2022   4.  Patient will demonstrate Rt [place] joint mobility WFL to facilitate usual self care, dressing, reaching overhead at PLOF s limitation due to symptoms.  Goal status: on going - assessed 01/02/2022   5.  Patient will demonstrate Rt UE MMT 4/5 or greater throughout to facilitate usual lifting, carrying in functional activity to PLOF s limitation.   Goal status: on going - assessed 01/02/2022   6.  Patient will demonstrate ability to sleep s restriction due to symptom.  Goal status: on going - assessed 01/11/2022     PLAN: PT FREQUENCY:  2-3x/week , then 1-2x/week as able   PT DURATION: 10 weeks   PLANNED  INTERVENTIONS: Therapeutic exercises, Therapeutic activity, Neuro Muscular re-education, Balance training, Gait training, Patient/Family education, Joint mobilization, Stair training, DME instructions, Dry Needling, Electrical stimulation, Cryotherapy, Moist heat, Taping, Ultrasound, Ionotophoresis 4mg /ml Dexamethasone, and Manual therapy.  All included unless contraindicated   PLAN FOR NEXT SESSION: PROM progression only per PROTOCOL/referral from MD until 02/04/22, vaso/estim at end if desired.    Laureen Abrahams, PT, DPT 01/16/22 9:21 AM

## 2022-01-17 ENCOUNTER — Other Ambulatory Visit: Payer: Self-pay

## 2022-01-17 ENCOUNTER — Encounter: Payer: Self-pay | Admitting: Hematology

## 2022-01-18 ENCOUNTER — Ambulatory Visit (INDEPENDENT_AMBULATORY_CARE_PROVIDER_SITE_OTHER): Payer: Medicare Other | Admitting: Rehabilitative and Restorative Service Providers"

## 2022-01-18 ENCOUNTER — Encounter: Payer: Self-pay | Admitting: Rehabilitative and Restorative Service Providers"

## 2022-01-18 DIAGNOSIS — M25511 Pain in right shoulder: Secondary | ICD-10-CM

## 2022-01-18 DIAGNOSIS — G8929 Other chronic pain: Secondary | ICD-10-CM

## 2022-01-18 DIAGNOSIS — M6281 Muscle weakness (generalized): Secondary | ICD-10-CM | POA: Diagnosis not present

## 2022-01-18 DIAGNOSIS — M25611 Stiffness of right shoulder, not elsewhere classified: Secondary | ICD-10-CM

## 2022-01-18 DIAGNOSIS — R600 Localized edema: Secondary | ICD-10-CM

## 2022-01-18 NOTE — Therapy (Signed)
OUTPATIENT PHYSICAL THERAPY TREATMENT NOTE   Patient Name: [name] [name] MRN: [id number] DOB:December 17, 1955, 66 y.o., female Today's Date: 01/18/2022  END OF SESSION:   PT End of Session - 01/18/22 1257     Visit Number 8    Number of Visits 20    Date for PT Re-Evaluation 03/06/22    Authorization Type Medicare    Progress Note Due on Visit 10    PT Start Time 1257    PT Stop Time 1332    PT Time Calculation (min) 35 min    Activity Tolerance Patient limited by pain    Behavior During Therapy [facility] for tasks assessed/performed                  Past Medical History:  Diagnosis Date   Allergy    seasonal   Anemia    Anxiety    takes Lexapro daily   Arthritis    "back, from neck down pass my bra area" (03/25/2017)   Asthma    Bartholin gland cyst 08/29/2011   Bruises easily    pt is on Effient   Chronic back pain    herniated nucleus pulposus   Chronic back pain    "neck to bra area; lower back" (03/25/2017)   Chronic kidney disease    recurrent UTI's this year 2022   COPD (chronic obstructive pulmonary disease) (HCC)    early stages   Coronary artery disease    Depression    takes Klonopin daily   Diabetes mellitus without complication (HCC)    Diverticulosis    Fibroadenoma of left breast    GERD (gastroesophageal reflux disease)    takes Nexium daily   H/O hiatal hernia    Heart attack ([place]) 2011   Hemorrhoids    Hernia    Hyperlipidemia    takes Lipitor daily   Hypertension    takes Losartan daily and Labetalol bid   Hypothyroidism    takes Synthroid daily   Insomnia    hydroxyzine prn   Joint pain    Pneumonia    "couple times" (03/25/2017)   Pre-diabetes    "just found out 1 wk ago" (03/25/2017)   Psoriasis    elbows,knees,back   Shortness of breath    with exertion   Slowing of urinary stream    Stress incontinence    Past Surgical History:  Procedure Laterality Date   [place]    "left one of my ovaries"   BACK SURGERY     BIOPSY  05/27/2019   Procedure: BIOPSY;  Surgeon: Daneil Dolin, MD;  [facility];  Service: Endoscopy;;   COLONOSCOPY  2011   Dr. Ardis Hughs: Mild diverticulosis, descending diminutive colon polyp (not retrieved), next colonoscopy 10 years   CORONARY ANGIOPLASTY WITH STENT PLACEMENT  2011 X2   "regular stents didn't work; had to go back in in ~ 1 month and put in medicated stents"   DILATION AND CURETTAGE OF UTERUS     ESOPHAGOGASTRODUODENOSCOPY     ESOPHAGOGASTRODUODENOSCOPY (EGD) WITH PROPOFOL N/A 05/27/2019   normal esophagus, dilation, erosive gastropathy s/p biopsy, normal duodenum. Negative H.pylori.    LEFT HEART CATH AND CORONARY ANGIOGRAPHY N/A 03/26/2017   Procedure: LEFT HEART CATH AND CORONARY ANGIOGRAPHY;  Surgeon: Belva Crome, MD;  Location: [place] [facility];  Service: Cardiovascular;  Laterality: N/A;   LUMBAR LAMINECTOMY/DECOMPRESSION MICRODISCECTOMY  08/05/2011   Procedure: LUMBAR LAMINECTOMY/DECOMPRESSION MICRODISCECTOMY;  Surgeon: Otilio Connors, MD;  Location: [facility];  Service: Neurosurgery;  Laterality: Right;  Right Lumbar four-five extraforaminal discectomy   MALONEY DILATION N/A 05/27/2019   Procedure: Venia Minks DILATION;  Surgeon: Daneil Dolin, MD;  Location: [facility];  Service: Endoscopy;  Laterality: N/A;  54   SHOULDER ARTHROSCOPY WITH ROTATOR CUFF REPAIR AND OPEN BICEPS TENODESIS Right 12/24/2021   Procedure: RIGHT SHOULDER ARTHROSCOPY WITH ROTATOR CUFF REPAIR AND BICEPS TENODESIS;  Surgeon: Marybelle Killings, MD;  Location: [place];  Service: Orthopedics;  Laterality: Right;   TONSILLECTOMY     as a child   UPPER GASTROINTESTINAL ENDOSCOPY     Patient Active Problem List   Diagnosis Date Noted   Nontraumatic complete tear of right rotator cuff    Superior glenoid labrum lesion of left shoulder    Iron deficiency anemia 11/02/2021   Major depressive disorder, recurrent, in full remission with  anxious distress ([place]) 05/31/2021   Rotator cuff tendinitis, right 05/08/2021   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   Chronic neck pain 02/26/2021   GAD (generalized anxiety disorder) 01/08/2021   Acute cystitis without hematuria 11/07/2020   Influenza vaccine needed 06/15/2020   Abdominal pain, epigastric 04/28/2019   Esophageal dysphagia 04/28/2019   Bipolar I disorder ([place]) 09/17/2018   Insomnia 10/28/2017   Prediabetes 05/28/2017   QT prolongation 03/25/2017   Encounter for screening mammogram for breast cancer 09/11/2015   Psoriasis 06/22/2015   COPD (chronic obstructive pulmonary disease) (HCC)GOLD E 05/18/2015   Anxiety and depression 07/10/2013   Essential hypertension, benign 06/07/2013   Dyslipidemia 06/07/2013   Asthma, chronic 06/07/2013   Emphysema lung ([place]) 06/07/2013   Chronic back pain 06/07/2013   CAD S/P percutaneous coronary angioplasty 06/07/2013   GERD (gastroesophageal reflux disease) 06/07/2013   Breast lump on left side at 3 o'clock position 10/13/2012   S/P abdominal hysterectomy and right salpingo-oophorectomy 08/29/2011   TOBACCO ABUSE 07/17/2009   Chronic rhinitis 07/17/2009   Lung nodule < 6cm on CT 07/17/2009   ALLERGY, FOOD 07/17/2009   Hypothyroidism 12/22/2008     THERAPY DIAG:  Chronic right shoulder pain  Muscle weakness (generalized)  Stiffness of right shoulder, not elsewhere classified  Localized edema  PCP: Troy Sine MD   REFERRING PROVIDER: Marybelle Killings, MD   REFERRING DIAG: M75.81 (ICD-10-CM) - Rotator cuff tendinitis, right   Rationale for Evaluation and Treatment Rehabilitation   ONSET DATE: Surgery 12/24/2021   SUBJECTIVE:  SUBJECTIVE STATEMENT: Pt indicated taking medicine for symptoms.  Pt rated pain at most today at  worst 7/10.  Reported achy in arm.    PERTINENT HISTORY: Chronic back pain, CKD, COPD, CAD, depression, DM, GERD   PAIN:  NPRS scale: 7/10 at worst.   Pain location: Rt shoulder Pain description: throbbing, burning.  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    OBJECTIVE:    PATIENT SURVEYS:   12/26/2021: FOTO intake:  20  predicted:  52   POSTURE: 12/26/2021 Rounded shoulder, forward head posture.  Rt arm in sling upon arrival              Localized edema Rt shoulder visibly    UPPER EXTREMITY ROM:    ROM Right 12/26/2021 Passive in supine - All limited by pain Right 01/02/2022  PROM - limited by pain response Right 01/09/2022  PROM supine PROM Right 01/11/2022 assessed supine 01/18/2022 PROM in supine  Shoulder flexion 10 100 in supine 125 in spuine  115 supine 125 supine  Shoulder extension        Shoulder abduction 5 90 in supine     Shoulder adduction        Shoulder internal rotation To belly With arm at side   55 degrees assessed at 70 degrees abduction   Shoulder external rotation -20 With arm at side 0 in 30 deg abduction  42 in 30 deg abduction supine 50 degrees assessed at 70 degrees abduction 53 degrees assessed at 70 degrees abduction  Elbow flexion WFL passively      Elbow extension -20 deg c pain in shoulder passively      (Blank rows = not tested)   UPPER EXTREMITY MMT:                         12/26/2021:  not tested due to protocol for surgery - Rt shoulder MMT Right   Left    Shoulder flexion      Shoulder extension      Shoulder abduction      Shoulder adduction      Shoulder internal rotation      Shoulder external rotation      Middle  trapezius      Lower trapezius      Elbow flexion      Elbow extension      Wrist flexion      Wrist extension      Wrist ulnar deviation      Wrist radial deviation      Wrist pronation      Wrist supination      Grip strength (lbs)      (Blank rows = not tested)   SHOULDER SPECIAL TESTS:             12/26/2021: none tested today   JOINT MOBILITY TESTING:  12/26/2021 heavy muscle guarding and pain limited full assessment for Rt shoulder.    PALPATION:  6/14/2023Tenderness throughout Rt shoulder region in muscles and joint, incision sites.               TODAY'S TREATMENT:  01/18/2022 Manual therapy:  Rt shoulder PROM gentle to  tolerance in flexion, ER, ABD.  G1-g2 mid range mobilizations to Rt GH joint.   Longer duration end range stretching in ER and flexion mobility for motion gains /relaxed   Therex:  Supine Passive range c Lt UE lifting Rt into flexion to tolerance 2-3 sec hold x 15  Seated table slides passive flexion 5 sec hold x 10 Rt, ER leaning forward 10 sec hold x 5  Modalities:  Vasopnuematic device X 10 min to Rt shoulder, 34 deg, medium compression  01/16/22 Therex Scapular retraction x 20 reps sitting Shoulder rolls backward x 20 reps Pendulums lateral, ant/post and circles x 20 reps each Passive elbow extension stretch in supine x 3 min with manual below - encouraged pt to perform at home Demonstrated PROM of Rt shoulder with LUE assist - pt performed 3-5 reps of supine flexion  Manual therapy:  Rt shoulder PROM gentle to tolerance in flexion, ER, ABD.  G1-g2 mid range mobilizations to Rt GH joint.  STM to Rt biceps  01/11/2022 Manual therapy: PROM Flexion, IR and ER 30-32 minutes with PT within comfortable range  Therapeutic Exercises: Shoulder blade pinches 10X 5 seconds  Codmans Forward/Backward; CW; CCW 20X each (needed coaching for correct technique)  Ice pack 5 minutes post-exercises   PATIENT EDUCATION: 12/26/2021 Education details: HEP,  POC Person educated: Patient Education method: Consulting civil engineer, Demonstration, Verbal cues, and Handouts Education comprehension: verbalized understanding, returned demonstration, and verbal cues required     HOME EXERCISE PROGRAM: Access Code: [email or web address] URL: [email or web address] Date: 12/26/2021 Prepared by: Scot Jun   Exercises - Seated Scapular Retraction  - 3-5 x daily - 7 x weekly - 1 sets - 10 reps - 3-5 hold - Circular Shoulder Pendulum with Table Support  - 2-3 x daily - 7 x weekly - 1-2 sets - 10 reps - Flexion-Extension Shoulder Pendulum with Table Support (Mirrored)  - 2-3 x daily - 7 x weekly - 1-2 sets - 10 reps - Horizontal Shoulder Pendulum with Table Support  - 2-3 x daily - 7 x weekly - 1-2 sets - 10 reps - Supported Elbow Flexion Extension PROM (Mirrored)  - 2-3 x daily - 7 x weekly - 1-2 sets - 10 reps   ASSESSMENT:   CLINICAL IMPRESSION: Minimal joint mobility restriction noted in assessment within available range.  Pain and muscle guarding most evident limitation in mobility at this time.  Continued overall progression noted compared to previous presentations.      OBJECTIVE IMPAIRMENTS decreased activity tolerance, decreased coordination, decreased endurance, decreased mobility, decreased ROM, decreased strength, hypomobility, increased edema, increased fascial restrictions, impaired perceived functional ability, impaired flexibility, impaired UE functional use, improper body mechanics, and pain.    ACTIVITY LIMITATIONS carrying, lifting, bed mobility, bathing, toileting, dressing, reach over head, hygiene/grooming, and locomotion level   PARTICIPATION LIMITATIONS: meal prep, cleaning, laundry, interpersonal relationship, community activity, occupation, and yard work   PERSONAL FACTORS  Chronic back pain, CKD, COPD, CAD, depression, DM, GERD  are also affecting patient's functional outcome.    REHAB POTENTIAL: Good   CLINICAL DECISION MAKING:  Stable/uncomplicated   EVALUATION COMPLEXITY: Low     GOALS: Goals reviewed with patient? Yes   Short term PT Goals (target date for Short term goals are 3 weeks 01/16/2022) Patient will demonstrate independent use of home exercise program to maintain progress from in clinic treatments. Goal status: MET 01/16/22   Long term PT goals (target dates for all long term goals are 10 weeks  03/06/2022 )  1. Patient will demonstrate/report pain at worst less than or equal to 2/10 to facilitate minimal limitation in daily activity secondary to pain symptoms. Goal status: on going - assessed 01/11/2022   2. Patient will demonstrate independent use of home exercise program to facilitate ability to maintain/progress functional gains from skilled physical therapy services. Goal status: on going - assessed 01/11/2022   3. Patient will demonstrate FOTO outcome > or = 52 % to indicate reduced disability due to condition. Goal status: on going - assessed 01/02/2022   4.  Patient will demonstrate Rt [place] joint mobility WFL to facilitate usual self care, dressing, reaching overhead at PLOF s limitation due to symptoms.  Goal status: on going - assessed 01/02/2022   5.  Patient will demonstrate Rt UE MMT 4/5 or greater throughout to facilitate usual lifting, carrying in functional activity to PLOF s limitation.   Goal status: on going - assessed 01/02/2022   6.  Patient will demonstrate ability to sleep s restriction due to symptom.  Goal status: on going - assessed 01/11/2022     PLAN: PT FREQUENCY:  2-3x/week , then 1-2x/week as able   PT DURATION: 10 weeks   PLANNED INTERVENTIONS: Therapeutic exercises, Therapeutic activity, Neuro Muscular re-education, Balance training, Gait training, Patient/Family education, Joint mobilization, Stair training, DME instructions, Dry Needling, Electrical stimulation, Cryotherapy, Moist heat, Taping, Ultrasound, Ionotophoresis 72m/ml Dexamethasone, and Manual therapy.  All  included unless contraindicated   PLAN FOR NEXT SESSION: PROM progression with manual/therex per PROTOCOL/referral from MD until 02/04/22   MScot Jun PT, DPT, OCS, ATC 01/18/22  1:22 PM

## 2022-01-22 ENCOUNTER — Other Ambulatory Visit: Payer: Self-pay

## 2022-01-22 ENCOUNTER — Encounter: Payer: Self-pay | Admitting: Hematology

## 2022-01-23 ENCOUNTER — Encounter: Payer: Self-pay | Admitting: Rehabilitative and Restorative Service Providers"

## 2022-01-23 ENCOUNTER — Ambulatory Visit (INDEPENDENT_AMBULATORY_CARE_PROVIDER_SITE_OTHER): Payer: Medicare Other | Admitting: Rehabilitative and Restorative Service Providers"

## 2022-01-23 DIAGNOSIS — R600 Localized edema: Secondary | ICD-10-CM | POA: Diagnosis not present

## 2022-01-23 DIAGNOSIS — M25611 Stiffness of right shoulder, not elsewhere classified: Secondary | ICD-10-CM

## 2022-01-23 DIAGNOSIS — G8929 Other chronic pain: Secondary | ICD-10-CM | POA: Diagnosis not present

## 2022-01-23 DIAGNOSIS — M25511 Pain in right shoulder: Secondary | ICD-10-CM | POA: Diagnosis not present

## 2022-01-23 DIAGNOSIS — M6281 Muscle weakness (generalized): Secondary | ICD-10-CM

## 2022-01-23 NOTE — Therapy (Signed)
OUTPATIENT PHYSICAL THERAPY TREATMENT NOTE   Patient Name: [name] [name] MRN: [id number] DOB:September 14, 1955, 66 y.o., female Today's Date: 01/23/2022  END OF SESSION:   PT End of Session - 01/23/22 1101     Visit Number 9    Number of Visits 20    Date for PT Re-Evaluation 03/06/22    Authorization Type Medicare    Progress Note Due on Visit 10    PT Start Time 1058    PT Stop Time 1138    PT Time Calculation (min) 40 min    Activity Tolerance Patient tolerated treatment well    Behavior During Therapy WFL for tasks assessed/performed                   Past Medical History:  Diagnosis Date   Allergy    seasonal   Anemia    Anxiety    takes Lexapro daily   Arthritis    "back, from neck down pass my bra area" (03/25/2017)   Asthma    Bartholin gland cyst 08/29/2011   Bruises easily    pt is on Effient   Chronic back pain    herniated nucleus pulposus   Chronic back pain    "neck to bra area; lower back" (03/25/2017)   Chronic kidney disease    recurrent UTI's this year 2022   COPD (chronic obstructive pulmonary disease) (HCC)    early stages   Coronary artery disease    Depression    takes Klonopin daily   Diabetes mellitus without complication ([place])    Diverticulosis    Fibroadenoma of left breast    GERD (gastroesophageal reflux disease)    takes Nexium daily   H/O hiatal hernia    Heart attack ([place]) 2011   Hemorrhoids    Hernia    Hyperlipidemia    takes Lipitor daily   Hypertension    takes Losartan daily and Labetalol bid   Hypothyroidism    takes Synthroid daily   Insomnia    hydroxyzine prn   Joint pain    Pneumonia    "couple times" (03/25/2017)   Pre-diabetes    "just found out 1 wk ago" (03/25/2017)   Psoriasis    elbows,knees,back   Shortness of breath    with exertion   Slowing of urinary stream    Stress incontinence    Past Surgical History:  Procedure Laterality Date   [place]   "left one of my ovaries"   BACK SURGERY     BIOPSY  05/27/2019   Procedure: BIOPSY;  Surgeon: Daneil Dolin, MD;  Location: [facility];  Service: Endoscopy;;   COLONOSCOPY  2011   Dr. Ardis Hughs: Mild diverticulosis, descending diminutive colon polyp (not retrieved), next colonoscopy 10 years   CORONARY ANGIOPLASTY WITH STENT PLACEMENT  2011 X2   "regular stents didn't work; had to go back in in ~ 1 month and put in medicated stents"   DILATION AND CURETTAGE OF UTERUS     ESOPHAGOGASTRODUODENOSCOPY     ESOPHAGOGASTRODUODENOSCOPY (EGD) WITH PROPOFOL N/A 05/27/2019   normal esophagus, dilation, erosive gastropathy s/p biopsy, normal duodenum. Negative H.pylori.    LEFT HEART CATH AND CORONARY ANGIOGRAPHY N/A 03/26/2017   Procedure: LEFT HEART CATH AND CORONARY ANGIOGRAPHY;  Surgeon: Belva Crome, MD;  Location: [facility];  Service: Cardiovascular;  Laterality: N/A;   LUMBAR LAMINECTOMY/DECOMPRESSION MICRODISCECTOMY  08/05/2011   Procedure: LUMBAR LAMINECTOMY/DECOMPRESSION  MICRODISCECTOMY;  Surgeon: Otilio Connors, MD;  Location: [facility];  Service: Neurosurgery;  Laterality: Right;  Right Lumbar four-five extraforaminal discectomy   MALONEY DILATION N/A 05/27/2019   Procedure: Venia Minks DILATION;  Surgeon: Daneil Dolin, MD;  Location: [facility];  Service: Endoscopy;  Laterality: N/A;  54   SHOULDER ARTHROSCOPY WITH ROTATOR CUFF REPAIR AND OPEN BICEPS TENODESIS Right 12/24/2021   Procedure: RIGHT SHOULDER ARTHROSCOPY WITH ROTATOR CUFF REPAIR AND BICEPS TENODESIS;  Surgeon: Marybelle Killings, MD;  Location: [facility];  Service: Orthopedics;  Laterality: Right;   TONSILLECTOMY     as a child   UPPER GASTROINTESTINAL ENDOSCOPY     Patient Active Problem List   Diagnosis Date Noted   Nontraumatic complete tear of right rotator cuff    Superior glenoid labrum lesion of left shoulder    Iron deficiency anemia 11/02/2021   Major depressive disorder, recurrent, in  full remission with anxious distress ([place]) 05/31/2021   Rotator cuff tendinitis, right 05/08/2021   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   Chronic neck pain 02/26/2021   GAD (generalized anxiety disorder) 01/08/2021   Acute cystitis without hematuria 11/07/2020   Influenza vaccine needed 06/15/2020   Abdominal pain, epigastric 04/28/2019   Esophageal dysphagia 04/28/2019   Bipolar I disorder ([place]) 09/17/2018   Insomnia 10/28/2017   Prediabetes 05/28/2017   QT prolongation 03/25/2017   Encounter for screening mammogram for breast cancer 09/11/2015   Psoriasis 06/22/2015   COPD (chronic obstructive pulmonary disease) (HCC)GOLD E 05/18/2015   Anxiety and depression 07/10/2013   Essential hypertension, benign 06/07/2013   Dyslipidemia 06/07/2013   Asthma, chronic 06/07/2013   Emphysema lung ([place]) 06/07/2013   Chronic back pain 06/07/2013   CAD S/P percutaneous coronary angioplasty 06/07/2013   GERD (gastroesophageal reflux disease) 06/07/2013   Breast lump on left side at 3 o'clock position 10/13/2012   S/P abdominal hysterectomy and right salpingo-oophorectomy 08/29/2011   TOBACCO ABUSE 07/17/2009   Chronic rhinitis 07/17/2009   Lung nodule < 6cm on CT 07/17/2009   ALLERGY, FOOD 07/17/2009   Hypothyroidism 12/22/2008     THERAPY DIAG:  Chronic right shoulder pain  Muscle weakness (generalized)  Stiffness of right shoulder, not elsewhere classified  Localized edema  PCP: Troy Sine MD   REFERRING PROVIDER: Marybelle Killings, MD   REFERRING DIAG: M75.81 (ICD-10-CM) - Rotator cuff tendinitis, right   Rationale for Evaluation and Treatment Rehabilitation   ONSET DATE: Surgery 12/24/2021   SUBJECTIVE:  SUBJECTIVE STATEMENT: Pt indicated waking up with pain today, rated at 8/10.   No specific instance of pain.    PERTINENT HISTORY: Chronic back pain, CKD, COPD, CAD, depression, DM, GERD   PAIN:  NPRS scale: 8/10 at worst.   Pain location: Rt shoulder Pain description: throbbing, burning.  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    OBJECTIVE:    PATIENT SURVEYS:   12/26/2021: FOTO intake:  20  predicted:  52   POSTURE: 12/26/2021 Rounded shoulder, forward head posture.  Rt arm in sling upon arrival              Localized edema Rt shoulder visibly    UPPER EXTREMITY ROM:    ROM Right 12/26/2021 Passive in supine - All limited by pain Right 01/02/2022  PROM - limited by pain response Right 01/09/2022  PROM supine PROM Right 01/11/2022 assessed supine 01/18/2022 PROM in supine  Shoulder flexion 10 100 in supine 125 in spuine  115 supine 125 supine  Shoulder extension        Shoulder abduction 5 90 in supine     Shoulder adduction        Shoulder internal rotation To belly With arm at side   55 degrees assessed at 70 degrees abduction   Shoulder external rotation -20 With arm at side 0 in 30 deg abduction  42 in 30 deg abduction supine 50 degrees assessed at 70 degrees abduction 53 degrees assessed at 70 degrees abduction  Elbow flexion WFL passively      Elbow extension -20 deg c pain in shoulder passively      (Blank rows = not tested)   UPPER EXTREMITY MMT:                         12/26/2021:  not tested due to protocol for surgery - Rt shoulder MMT Right   Left    Shoulder flexion      Shoulder extension      Shoulder abduction      Shoulder adduction      Shoulder internal rotation      Shoulder external rotation       Middle trapezius      Lower trapezius      Elbow flexion      Elbow extension      Wrist flexion      Wrist extension      Wrist ulnar deviation      Wrist radial deviation      Wrist pronation      Wrist supination      Grip strength (lbs)      (Blank rows = not tested)   SHOULDER SPECIAL TESTS:             12/26/2021: none tested today   JOINT MOBILITY TESTING:  12/26/2021 heavy muscle guarding and pain limited full assessment for Rt shoulder.    PALPATION:  6/14/2023Tenderness throughout Rt shoulder region in muscles and joint, incision sites.               TODAY'S TREATMENT:  01/23/2022 Manual therapy:  Rt shoulder PROM gentle to tolerance in flexion,  ER, ABD.  G2-g3 mid/end range mobilizations to Rt GH joint.   Longer duration end range stretching in ER and flexion mobility for motion gains /relaxed   Therex:  Supine Passive range c Lt UE lifting Rt into flexion to tolerance 2-3 sec hold 2 x10  Supine Cross arm stretch Rt 15 sec x 5   Modalities:  Vasopnuematic device X 10 min to Rt shoulder, 34 deg, medium compression  01/18/2022 Manual therapy:  Rt shoulder PROM gentle to tolerance in flexion, ER, ABD.  G1-g2 mid range mobilizations to Rt GH joint.   Longer duration end range stretching in ER and flexion mobility for motion gains /relaxed   Therex:  Supine Passive range c Lt UE lifting Rt into flexion to tolerance 2-3 sec hold x 15  Seated table slides passive flexion 5 sec hold x 10 Rt, ER leaning forward 10 sec hold x 5  Modalities:  Vasopnuematic device X 10 min to Rt shoulder, 34 deg, medium compression  01/16/22 Therex Scapular retraction x 20 reps sitting Shoulder rolls backward x 20 reps Pendulums lateral, ant/post and circles x 20 reps each Passive elbow extension stretch in supine x 3 min with manual below - encouraged pt to perform at home Demonstrated PROM of Rt shoulder with LUE assist - pt performed 3-5 reps of supine flexion  Manual therapy:  Rt  shoulder PROM gentle to tolerance in flexion, ER, ABD.  G1-g2 mid range mobilizations to Rt GH joint.  STM to Rt biceps  01/11/2022 Manual therapy: PROM Flexion, IR and ER 30-32 minutes with PT within comfortable range  Therapeutic Exercises: Shoulder blade pinches 10X 5 seconds  Codmans Forward/Backward; CW; CCW 20X each (needed coaching for correct technique)  Ice pack 5 minutes post-exercises   PATIENT EDUCATION: 12/26/2021 Education details: HEP, POC Person educated: Patient Education method: Consulting civil engineer, Demonstration, Verbal cues, and Handouts Education comprehension: verbalized understanding, returned demonstration, and verbal cues required     HOME EXERCISE PROGRAM: Access Code: [email or web address] URL: [email or web address] Date: 12/26/2021 Prepared by: Scot Jun   Exercises - Seated Scapular Retraction  - 3-5 x daily - 7 x weekly - 1 sets - 10 reps - 3-5 hold - Circular Shoulder Pendulum with Table Support  - 2-3 x daily - 7 x weekly - 1-2 sets - 10 reps - Flexion-Extension Shoulder Pendulum with Table Support (Mirrored)  - 2-3 x daily - 7 x weekly - 1-2 sets - 10 reps - Horizontal Shoulder Pendulum with Table Support  - 2-3 x daily - 7 x weekly - 1-2 sets - 10 reps - Supported Elbow Flexion Extension PROM (Mirrored)  - 2-3 x daily - 7 x weekly - 1-2 sets - 10 reps   ASSESSMENT:   CLINICAL IMPRESSION: Consistent presentation overall over last few visits c slow but steady progression in tolerance for mobility overall passively in Rt shoulder.  Continued cues for consistent HEP use of passive mobility interventions to promote mobility gains.      OBJECTIVE IMPAIRMENTS decreased activity tolerance, decreased coordination, decreased endurance, decreased mobility, decreased ROM, decreased strength, hypomobility, increased edema, increased fascial restrictions, impaired perceived functional ability, impaired flexibility, impaired UE functional use, improper body  mechanics, and pain.    ACTIVITY LIMITATIONS carrying, lifting, bed mobility, bathing, toileting, dressing, reach over head, hygiene/grooming, and locomotion level   PARTICIPATION LIMITATIONS: meal prep, cleaning, laundry, interpersonal relationship, community activity, occupation, and yard work   PERSONAL FACTORS  Chronic back pain, CKD, COPD, CAD, depression, DM, GERD  are also  affecting patient's functional outcome.    REHAB POTENTIAL: Good   CLINICAL DECISION MAKING: Stable/uncomplicated   EVALUATION COMPLEXITY: Low     GOALS: Goals reviewed with patient? Yes   Short term PT Goals (target date for Short term goals are 3 weeks 01/16/2022) Patient will demonstrate independent use of home exercise program to maintain progress from in clinic treatments. Goal status: MET 01/16/22   Long term PT goals (target dates for all long term goals are 10 weeks  03/06/2022 )   1. Patient will demonstrate/report pain at worst less than or equal to 2/10 to facilitate minimal limitation in daily activity secondary to pain symptoms. Goal status: on going - assessed 01/11/2022   2. Patient will demonstrate independent use of home exercise program to facilitate ability to maintain/progress functional gains from skilled physical therapy services. Goal status: on going - assessed 01/11/2022   3. Patient will demonstrate FOTO outcome > or = 52 % to indicate reduced disability due to condition. Goal status: on going - assessed 01/02/2022   4.  Patient will demonstrate Rt Benson joint mobility WFL to facilitate usual self care, dressing, reaching overhead at PLOF s limitation due to symptoms.  Goal status: on going - assessed 01/02/2022   5.  Patient will demonstrate Rt UE MMT 4/5 or greater throughout to facilitate usual lifting, carrying in functional activity to PLOF s limitation.   Goal status: on going - assessed 01/02/2022   6.  Patient will demonstrate ability to sleep s restriction due to symptom.  Goal  status: on going - assessed 01/11/2022     PLAN: PT FREQUENCY:  2-3x/week , then 1-2x/week as able   PT DURATION: 10 weeks   PLANNED INTERVENTIONS: Therapeutic exercises, Therapeutic activity, Neuro Muscular re-education, Balance training, Gait training, Patient/Family education, Joint mobilization, Stair training, DME instructions, Dry Needling, Electrical stimulation, Cryotherapy, Moist heat, Taping, Ultrasound, Ionotophoresis 67m/ml Dexamethasone, and Manual therapy.  All included unless contraindicated   PLAN FOR NEXT SESSION: PROM progression with manual/therex per PROTOCOL/referral from MD until 02/04/22   MScot Jun PT, DPT, OCS, ATC 01/23/22  11:30 AM

## 2022-01-24 ENCOUNTER — Other Ambulatory Visit: Payer: Self-pay

## 2022-01-25 ENCOUNTER — Other Ambulatory Visit: Payer: Self-pay

## 2022-01-25 ENCOUNTER — Encounter: Payer: Self-pay | Admitting: Rehabilitative and Restorative Service Providers"

## 2022-01-25 ENCOUNTER — Ambulatory Visit (INDEPENDENT_AMBULATORY_CARE_PROVIDER_SITE_OTHER): Payer: Medicare Other | Admitting: Rehabilitative and Restorative Service Providers"

## 2022-01-25 DIAGNOSIS — G8929 Other chronic pain: Secondary | ICD-10-CM

## 2022-01-25 DIAGNOSIS — M25611 Stiffness of right shoulder, not elsewhere classified: Secondary | ICD-10-CM

## 2022-01-25 DIAGNOSIS — R600 Localized edema: Secondary | ICD-10-CM | POA: Diagnosis not present

## 2022-01-25 DIAGNOSIS — M6281 Muscle weakness (generalized): Secondary | ICD-10-CM

## 2022-01-25 DIAGNOSIS — M25511 Pain in right shoulder: Secondary | ICD-10-CM | POA: Diagnosis not present

## 2022-01-25 NOTE — Therapy (Signed)
OUTPATIENT PHYSICAL THERAPY TREATMENT NOTE /PROGRESS NOTE   Patient Name: [name] [name] MRN: [id number] DOB:14-Apr-1956, 66 y.o., female Today's Date: 01/25/2022  Progress Note Reporting Period 12/26/2021 to 01/25/2022  See note below for Objective Data and Assessment of Progress/Goals.    END OF SESSION:   PT End of Session - 01/25/22 1323     Visit Number 10    Number of Visits 20    Date for PT Re-Evaluation 03/06/22    Authorization Type Medicare    Progress Note Due on Visit 20    PT Start Time 1259    PT Stop Time 4098    PT Time Calculation (min) 49 min    Activity Tolerance Patient tolerated treatment well;Patient limited by pain   pain in mobility   Behavior During Therapy WFL for tasks assessed/performed                    Past Medical History:  Diagnosis Date   Allergy    seasonal   Anemia    Anxiety    takes Lexapro daily   Arthritis    "back, from neck down pass my bra area" (03/25/2017)   Asthma    Bartholin gland cyst 08/29/2011   Bruises easily    pt is on Effient   Chronic back pain    herniated nucleus pulposus   Chronic back pain    "neck to bra area; lower back" (03/25/2017)   Chronic kidney disease    recurrent UTI's this year 2022   COPD (chronic obstructive pulmonary disease) (HCC)    early stages   Coronary artery disease    Depression    takes Klonopin daily   Diabetes mellitus without complication (HCC)    Diverticulosis    Fibroadenoma of left breast    GERD (gastroesophageal reflux disease)    takes Nexium daily   H/O hiatal hernia    Heart attack ([place]) 2011   Hemorrhoids    Hernia    Hyperlipidemia    takes Lipitor daily   Hypertension    takes Losartan daily and Labetalol bid   Hypothyroidism    takes Synthroid daily   Insomnia    hydroxyzine prn   Joint pain    Pneumonia    "couple times" (03/25/2017)   Pre-diabetes    "just found out 1 wk ago" (03/25/2017)   Psoriasis    elbows,knees,back   Shortness of  breath    with exertion   Slowing of urinary stream    Stress incontinence    Past Surgical History:  Procedure Laterality Date   [place]   "left one of my ovaries"   BACK SURGERY     BIOPSY  05/27/2019   Procedure: BIOPSY;  Surgeon: Daneil Dolin, MD;  [facility];  Service: Endoscopy;;   COLONOSCOPY  2011   Dr. Ardis Hughs: Mild diverticulosis, descending diminutive colon polyp (not retrieved), next colonoscopy 10 years   CORONARY ANGIOPLASTY WITH STENT PLACEMENT  2011 X2   "regular stents didn't work; had to go back in in ~ 1 month and put in medicated stents"   DILATION AND CURETTAGE OF UTERUS     ESOPHAGOGASTRODUODENOSCOPY     ESOPHAGOGASTRODUODENOSCOPY (EGD) WITH PROPOFOL N/A 05/27/2019   normal esophagus, dilation, erosive gastropathy s/p biopsy, normal duodenum. Negative H.pylori.    LEFT HEART CATH AND CORONARY ANGIOGRAPHY N/A 03/26/2017   Procedure: LEFT HEART CATH AND CORONARY  ANGIOGRAPHY;  Surgeon: Belva Crome, MD;  Location: [facility];  Service: Cardiovascular;  Laterality: N/A;   LUMBAR LAMINECTOMY/DECOMPRESSION MICRODISCECTOMY  08/05/2011   Procedure: LUMBAR LAMINECTOMY/DECOMPRESSION MICRODISCECTOMY;  Surgeon: Otilio Connors, MD;  Location: [facility];  Service: Neurosurgery;  Laterality: Right;  Right Lumbar four-five extraforaminal discectomy   MALONEY DILATION N/A 05/27/2019   Procedure: Venia Minks DILATION;  Surgeon: Daneil Dolin, MD;  [facility];  Service: Endoscopy;  Laterality: N/A;  54   SHOULDER ARTHROSCOPY WITH ROTATOR CUFF REPAIR AND OPEN BICEPS TENODESIS Right 12/24/2021   Procedure: RIGHT SHOULDER ARTHROSCOPY WITH ROTATOR CUFF REPAIR AND BICEPS TENODESIS;  Surgeon: Marybelle Killings, MD;  Location: [facility];  Service: Orthopedics;  Laterality: Right;   TONSILLECTOMY     as a child   UPPER GASTROINTESTINAL ENDOSCOPY     Patient Active Problem List   Diagnosis Date Noted    Nontraumatic complete tear of right rotator cuff    Superior glenoid labrum lesion of left shoulder    Iron deficiency anemia 11/02/2021   Major depressive disorder, recurrent, in full remission with anxious distress ([place]) 05/31/2021   Rotator cuff tendinitis, right 05/08/2021   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   Chronic neck pain 02/26/2021   GAD (generalized anxiety disorder) 01/08/2021   Acute cystitis without hematuria 11/07/2020   Influenza vaccine needed 06/15/2020   Abdominal pain, epigastric 04/28/2019   Esophageal dysphagia 04/28/2019   Bipolar I disorder ([place]) 09/17/2018   Insomnia 10/28/2017   Prediabetes 05/28/2017   QT prolongation 03/25/2017   Encounter for screening mammogram for breast cancer 09/11/2015   Psoriasis 06/22/2015   COPD (chronic obstructive pulmonary disease) (HCC)GOLD E 05/18/2015   Anxiety and depression 07/10/2013   Essential hypertension, benign 06/07/2013   Dyslipidemia 06/07/2013   Asthma, chronic 06/07/2013   Emphysema lung ([place]) 06/07/2013   Chronic back pain 06/07/2013   CAD S/P percutaneous coronary angioplasty 06/07/2013   GERD (gastroesophageal reflux disease) 06/07/2013   Breast lump on left side at 3 o'clock position 10/13/2012   S/P abdominal hysterectomy and right salpingo-oophorectomy 08/29/2011   TOBACCO ABUSE 07/17/2009   Chronic rhinitis 07/17/2009   Lung nodule < 6cm on CT 07/17/2009   ALLERGY, FOOD 07/17/2009   Hypothyroidism 12/22/2008     THERAPY DIAG:  Chronic right shoulder pain  Muscle weakness (generalized)  Stiffness of right shoulder, not elsewhere classified  Localized edema  PCP: Troy Sine MD   REFERRING PROVIDER: Marybelle Killings, MD   REFERRING DIAG: M75.81 (ICD-10-CM) - Rotator cuff tendinitis, right   Rationale for Evaluation and Treatment Rehabilitation   ONSET DATE: Surgery 12/24/2021   SUBJECTIVE:  SUBJECTIVE STATEMENT: Pt reported feeling pain complaints last night as weather came in.  Some better this morning.    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    OBJECTIVE:    PATIENT SURVEYS:   12/26/2021: FOTO intake:  20  predicted:  52   POSTURE: 12/26/2021 Rounded shoulder, forward head posture.  Rt arm in sling upon arrival              Localized edema Rt shoulder visibly    UPPER EXTREMITY ROM:    ROM Right 12/26/2021 Passive in supine - All limited by pain Right 01/02/2022  PROM - limited by pain response Right 01/09/2022  PROM supine PROM Right 01/11/2022 assessed supine 01/18/2022 PROM in supine 01/25/2022: PROM in supine  Shoulder flexion 10 100 in supine 125 in spuine  115 supine 125 supine 125 supine  Shoulder extension         Shoulder abduction 5 90 in supine    99 in supine  Shoulder adduction         Shoulder internal rotation To belly With arm at side   55 degrees assessed at 70 degrees abduction    Shoulder external rotation -20 With arm at side 0 in 30 deg abduction  42 in 30 deg abduction supine 50 degrees assessed at 70 degrees abduction 53 degrees assessed at 70 degrees abduction 50 degrees assessed at 60 degrees abduction  Elbow flexion WFL passively       Elbow extension -20 deg c pain in shoulder passively       (Blank rows = not tested)   UPPER  EXTREMITY MMT:                         12/26/2021:  not tested due to protocol for surgery - Rt shoulder MMT Right   Left    Shoulder flexion      Shoulder extension      Shoulder abduction      Shoulder adduction      Shoulder internal rotation      Shoulder external rotation      Middle trapezius      Lower trapezius      Elbow flexion      Elbow extension      Wrist flexion      Wrist extension      Wrist ulnar deviation      Wrist radial deviation      Wrist pronation      Wrist supination      Grip strength (lbs)      (Blank rows = not tested)   SHOULDER SPECIAL TESTS:             12/26/2021: none tested today   JOINT MOBILITY TESTING:  12/26/2021 heavy muscle guarding and pain limited full assessment for Rt shoulder.    PALPATION:  6/14/2023Tenderness throughout Rt shoulder region in muscles and joint, incision sites.  TODAY'S TREATMENT:  01/25/2022 Manual therapy:  Rt shoulder PROM gentle to tolerance in flexion, ER, ABD.  G1-G2 mid/end range mobilizations to Rt GH joint.   Longer duration end range stretching in ER and flexion mobility for motion gains /relaxed   Therex:  Supine Passive range c Lt UE lifting Rt into flexion to tolerance 2-3 sec hold x 15  Seated pendulum arm supported by Lt in sling clockwise circles, counterclockwise circles x 25 each   Modalities:  Vasopnuematic device X 10 min to Rt shoulder, 34 deg, medium compression  01/23/2022 Manual therapy:  Rt shoulder PROM gentle to tolerance in flexion, ER, ABD.  G2-g3 mid/end range mobilizations to Rt GH joint.   Longer duration end range stretching in ER and flexion mobility for motion gains /relaxed   Therex:  Supine Passive range c Lt UE lifting Rt into flexion to tolerance 2-3 sec hold 2 x10  Supine Cross arm stretch Rt 15 sec x 5   Modalities:  Vasopnuematic device X 10 min to Rt shoulder, 34 deg, medium compression  01/18/2022 Manual therapy:  Rt shoulder PROM gentle to  tolerance in flexion, ER, ABD.  G1-g2 mid range mobilizations to Rt GH joint.   Longer duration end range stretching in ER and flexion mobility for motion gains /relaxed   Therex:  Supine Passive range c Lt UE lifting Rt into flexion to tolerance 2-3 sec hold x 15  Seated table slides passive flexion 5 sec hold x 10 Rt, ER leaning forward 10 sec hold x 5  Modalities:  Vasopnuematic device X 10 min to Rt shoulder, 34 deg, medium compression   PATIENT EDUCATION: 12/26/2021 Education details: HEP, POC Person educated: Patient Education method: Consulting civil engineer, Media planner, Verbal cues, and Handouts Education comprehension: verbalized understanding, returned demonstration, and verbal cues required     HOME EXERCISE PROGRAM: Access Code: [email or web address] URL: [email or web address] Date: 12/26/2021 Prepared by: Scot Jun   Exercises - Seated Scapular Retraction  - 3-5 x daily - 7 x weekly - 1 sets - 10 reps - 3-5 hold - Circular Shoulder Pendulum with Table Support  - 2-3 x daily - 7 x weekly - 1-2 sets - 10 reps - Flexion-Extension Shoulder Pendulum with Table Support (Mirrored)  - 2-3 x daily - 7 x weekly - 1-2 sets - 10 reps - Horizontal Shoulder Pendulum with Table Support  - 2-3 x daily - 7 x weekly - 1-2 sets - 10 reps - Supported Elbow Flexion Extension PROM (Mirrored)  - 2-3 x daily - 7 x weekly - 1-2 sets - 10 reps   ASSESSMENT:   CLINICAL IMPRESSION: Pt continued to show slow but steady improvements in passive range as measured today in supine compared to past.  Continued complaints of Rt shoulder pain c mobility deficits noted.  Strength deficits likely but not assessed yet formally due to surgical protocol.   Continued skilled PT services indicated at this time.      OBJECTIVE IMPAIRMENTS decreased activity tolerance, decreased coordination, decreased endurance, decreased mobility, decreased ROM, decreased strength, hypomobility, increased edema, increased fascial  restrictions, impaired perceived functional ability, impaired flexibility, impaired UE functional use, improper body mechanics, and pain.    ACTIVITY LIMITATIONS carrying, lifting, bed mobility, bathing, toileting, dressing, reach over head, hygiene/grooming, and locomotion level   PARTICIPATION LIMITATIONS: meal prep, cleaning, laundry, interpersonal relationship, community activity, occupation, and yard work   PERSONAL FACTORS  Chronic back pain, CKD, COPD, CAD, depression, DM, GERD  are also affecting patient's functional outcome.  REHAB POTENTIAL: Good   CLINICAL DECISION MAKING: Stable/uncomplicated   EVALUATION COMPLEXITY: Low     GOALS: Goals reviewed with patient? Yes   Short term PT Goals (target date for Short term goals are 3 weeks 01/16/2022) Patient will demonstrate independent use of home exercise program to maintain progress from in clinic treatments. Goal status: MET 01/16/22   Long term PT goals (target dates for all long term goals are 10 weeks  03/06/2022 )   1. Patient will demonstrate/report pain at worst less than or equal to 2/10 to facilitate minimal limitation in daily activity secondary to pain symptoms. Goal status: on going - assessed 01/25/2022   2. Patient will demonstrate independent use of home exercise program to facilitate ability to maintain/progress functional gains from skilled physical therapy services. Goal status: on going - assessed 01/25/2022   3. Patient will demonstrate FOTO outcome > or = 52 % to indicate reduced disability due to condition. Goal status: on going - assessed 01/25/2022   4.  Patient will demonstrate Rt Abram joint mobility WFL to facilitate usual self care, dressing, reaching overhead at PLOF s limitation due to symptoms.  Goal status: on going - assessed 01/25/2022   5.  Patient will demonstrate Rt UE MMT 4/5 or greater throughout to facilitate usual lifting, carrying in functional activity to PLOF s limitation.   Goal status:  on going - assessed 01/25/2022   6.  Patient will demonstrate ability to sleep s restriction due to symptom.  Goal status: on going - assessed 01/25/2022     PLAN: PT FREQUENCY:  2-3x/week , then 1-2x/week as able   PT DURATION: 10 weeks   PLANNED INTERVENTIONS: Therapeutic exercises, Therapeutic activity, Neuro Muscular re-education, Balance training, Gait training, Patient/Family education, Joint mobilization, Stair training, DME instructions, Dry Needling, Electrical stimulation, Cryotherapy, Moist heat, Taping, Ultrasound, Ionotophoresis 24m/ml Dexamethasone, and Manual therapy.  All included unless contraindicated   PLAN FOR NEXT SESSION: PROM progression continued with manual/therex per PROTOCOL/referral from MD until 02/04/22   MScot Jun PT, DPT, OCS, ATC 01/25/22  1:49 PM

## 2022-01-29 ENCOUNTER — Ambulatory Visit (INDEPENDENT_AMBULATORY_CARE_PROVIDER_SITE_OTHER): Payer: Medicare Other | Admitting: Orthopaedic Surgery

## 2022-01-29 ENCOUNTER — Encounter: Payer: Self-pay | Admitting: Hematology

## 2022-01-29 ENCOUNTER — Other Ambulatory Visit (HOSPITAL_COMMUNITY): Payer: Self-pay | Admitting: Physician Assistant

## 2022-01-29 ENCOUNTER — Encounter: Payer: Self-pay | Admitting: Orthopaedic Surgery

## 2022-01-29 ENCOUNTER — Other Ambulatory Visit: Payer: Self-pay

## 2022-01-29 VITALS — BP 101/61 | HR 82 | Ht 62.0 in | Wt 142.0 lb

## 2022-01-29 DIAGNOSIS — M7501 Adhesive capsulitis of right shoulder: Secondary | ICD-10-CM

## 2022-01-29 DIAGNOSIS — M7581 Other shoulder lesions, right shoulder: Secondary | ICD-10-CM

## 2022-01-29 DIAGNOSIS — S43432D Superior glenoid labrum lesion of left shoulder, subsequent encounter: Secondary | ICD-10-CM

## 2022-01-29 DIAGNOSIS — F411 Generalized anxiety disorder: Secondary | ICD-10-CM

## 2022-01-29 MED ORDER — TRAMADOL HCL 50 MG PO TABS: 50.0000 mg | ORAL_TABLET | Freq: Four times a day (QID) | ORAL | 0 refills | Status: DC | PRN

## 2022-01-29 NOTE — Progress Notes (Signed)
Post-Op Visit Note   Patient: [name] [name]           Date of Birth: [birth year]           MRN: [id number] Visit Date: 01/29/2022 PCP: Pcp, No   Assessment & Plan: Patient returns she has been going to therapy now 6 weeks postop and can begin some active assistive range of motion we discussed getting a pulley she can use at home or throwing a rope over the door her daughter is with her and can assist with this.  She can progress with therapy.  I plan to recheck her in 4 weeks I sent in some tramadol for pain. Chief Complaint:  Chief Complaint  Patient presents with   Right Shoulder - Routine Post Op   Visit Diagnoses:  1. Rotator cuff tendinitis, right   2. Adhesive capsulitis of right shoulder   3. Superior glenoid labrum lesion of left shoulder, subsequent encounter     Plan: Post biceps tenodesis rotator cuff repair.  6 weeks out she can progress now with therapy.  Recheck 4 weeks.  Follow-Up Instructions: Return in about 4 years (around 01/29/2026).   Orders:  No orders of the defined types were placed in this encounter.  Meds ordered this encounter  Medications   traMADol (ULTRAM) 50 MG tablet    Sig: Take 1 tablet (50 mg total) by mouth every 6 (six) hours as needed.    Dispense:  40 tablet    Refill:  0    Post op pain    Imaging: No results found.  PMFS History: Patient Active Problem List   Diagnosis Date Noted   Nontraumatic complete tear of right rotator cuff    Superior glenoid labrum lesion of left shoulder    Iron deficiency anemia 11/02/2021   Major depressive disorder, recurrent, in full remission with anxious distress ([place]) 05/31/2021   Rotator cuff tendinitis, right 05/08/2021   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   Chronic neck pain 02/26/2021   GAD (generalized anxiety disorder) 01/08/2021   Acute cystitis without hematuria 11/07/2020   Influenza vaccine needed 06/15/2020   Abdominal pain, epigastric 04/28/2019   Esophageal  dysphagia 04/28/2019   Bipolar I disorder ([place]) 09/17/2018   Insomnia 10/28/2017   Prediabetes 05/28/2017   QT prolongation 03/25/2017   Encounter for screening mammogram for breast cancer 09/11/2015   Psoriasis 06/22/2015   COPD (chronic obstructive pulmonary disease) (HCC)GOLD E 05/18/2015   Anxiety and depression 07/10/2013   Essential hypertension, benign 06/07/2013   Dyslipidemia 06/07/2013   Asthma, chronic 06/07/2013   Emphysema lung ([place]) 06/07/2013   Chronic back pain 06/07/2013   CAD S/P percutaneous coronary angioplasty 06/07/2013   GERD (gastroesophageal reflux disease) 06/07/2013   Breast lump on left side at 3 o'clock position 10/13/2012   S/P abdominal hysterectomy and right salpingo-oophorectomy 08/29/2011   TOBACCO ABUSE 07/17/2009   Chronic rhinitis 07/17/2009   Lung nodule < 6cm on CT 07/17/2009   ALLERGY, FOOD 07/17/2009   Hypothyroidism 12/22/2008   Past Medical History:  Diagnosis Date   Allergy    seasonal   Anemia    Anxiety    takes Lexapro daily   Arthritis    "back, from neck down pass my bra area" (03/25/2017)   Asthma    Bartholin gland cyst 08/29/2011   Bruises easily    pt is on Effient   Chronic back pain    herniated nucleus pulposus   Chronic back pain    "  neck to bra area; lower back" (03/25/2017)   Chronic kidney disease    recurrent UTI's this year 2022   COPD (chronic obstructive pulmonary disease) (HCC)    early stages   Coronary artery disease    Depression    takes Klonopin daily   Diabetes mellitus without complication (HCC)    Diverticulosis    Fibroadenoma of left breast    GERD (gastroesophageal reflux disease)    takes Nexium daily   H/O hiatal hernia    Heart attack ([place]) 2011   Hemorrhoids    Hernia    Hyperlipidemia    takes Lipitor daily   Hypertension    takes Losartan daily and Labetalol bid   Hypothyroidism    takes Synthroid daily   Insomnia    hydroxyzine prn   Joint pain    Pneumonia    "couple  times" (03/25/2017)   Pre-diabetes    "just found out 1 wk ago" (03/25/2017)   Psoriasis    elbows,knees,back   Shortness of breath    with exertion   Slowing of urinary stream    Stress incontinence     Family History  Problem Relation Age of Onset   Heart disease Mother    Hypertension Mother    Stroke Mother    Mental illness Mother    Heart disease Father    Hypertension Father    Diabetes Father    Colon polyps Brother    Breast cancer Maternal Aunt    Throat cancer Maternal Uncle    Thyroid disease Paternal Aunt    Heart disease Maternal Grandfather    Cancer Paternal Grandmother        [place]   Anesthesia problems Daughter    Hypotension Neg Hx    Malignant hyperthermia Neg Hx    Pseudochol deficiency Neg Hx    Colon cancer Neg Hx    Stomach cancer Neg Hx    Esophageal cancer Neg Hx    Pancreatic cancer Neg Hx    Rectal cancer Neg Hx     Past Surgical History:  Procedure Laterality Date   ABDOMINAL EXPLORATION SURGERY  1977   ABDOMINAL HYSTERECTOMY  1977   "left one of my ovaries"   BACK SURGERY     BIOPSY  05/27/2019   Procedure: BIOPSY;  Surgeon: Daneil Dolin, MD;  [facility];  Service: Endoscopy;;   COLONOSCOPY  2011   Dr. Ardis Hughs: Mild diverticulosis, descending diminutive colon polyp (not retrieved), next colonoscopy 10 years   CORONARY ANGIOPLASTY WITH STENT PLACEMENT  2011 X2   "regular stents didn't work; had to go back in in ~ 1 month and put in medicated stents"   DILATION AND CURETTAGE OF UTERUS     ESOPHAGOGASTRODUODENOSCOPY     ESOPHAGOGASTRODUODENOSCOPY (EGD) WITH PROPOFOL N/A 05/27/2019   normal esophagus, dilation, erosive gastropathy s/p biopsy, normal duodenum. Negative H.pylori.    LEFT HEART CATH AND CORONARY ANGIOGRAPHY N/A 03/26/2017   Procedure: LEFT HEART CATH AND CORONARY ANGIOGRAPHY;  Surgeon: Belva Crome, MD;  Location: [facility];  Service: Cardiovascular;  Laterality: N/A;   LUMBAR  LAMINECTOMY/DECOMPRESSION MICRODISCECTOMY  08/05/2011   Procedure: LUMBAR LAMINECTOMY/DECOMPRESSION MICRODISCECTOMY;  Surgeon: Otilio Connors, MD;  Location: [facility];  Service: Neurosurgery;  Laterality: Right;  Right Lumbar four-five extraforaminal discectomy   MALONEY DILATION N/A 05/27/2019   Procedure: Venia Minks DILATION;  Surgeon: Daneil Dolin, MD;  [facility];  Service: Endoscopy;  Laterality: N/A;  43   SHOULDER ARTHROSCOPY WITH ROTATOR CUFF REPAIR AND OPEN BICEPS TENODESIS Right 12/24/2021   Procedure: RIGHT SHOULDER ARTHROSCOPY WITH ROTATOR CUFF REPAIR AND BICEPS TENODESIS;  Surgeon: Marybelle Killings, MD;  Location: [facility];  Service: Orthopedics;  Laterality: Right;   TONSILLECTOMY     as a child   UPPER GASTROINTESTINAL ENDOSCOPY     Social History   Occupational History   Not on file  Tobacco Use   Smoking status: Every Day    Packs/day: 0.50    Years: 50.00    Total pack years: 25.00    Types: Cigarettes   Smokeless tobacco: Never  Vaping Use   Vaping Use: Some days   Substances: Nicotine  Substance and Sexual Activity   Alcohol use: Yes    Comment: once a week   Drug use: Yes    Types: Marijuana    Comment: history of cocaine use, been about a year, per patient (12/18/21)   Sexual activity: Not Currently    Birth control/protection: Surgical

## 2022-01-30 ENCOUNTER — Ambulatory Visit (INDEPENDENT_AMBULATORY_CARE_PROVIDER_SITE_OTHER): Payer: Medicare Other | Admitting: Rehabilitative and Restorative Service Providers"

## 2022-01-30 ENCOUNTER — Encounter: Payer: Self-pay | Admitting: Rehabilitative and Restorative Service Providers"

## 2022-01-30 DIAGNOSIS — M25611 Stiffness of right shoulder, not elsewhere classified: Secondary | ICD-10-CM | POA: Diagnosis not present

## 2022-01-30 DIAGNOSIS — G8929 Other chronic pain: Secondary | ICD-10-CM | POA: Diagnosis not present

## 2022-01-30 DIAGNOSIS — R600 Localized edema: Secondary | ICD-10-CM | POA: Diagnosis not present

## 2022-01-30 DIAGNOSIS — M25511 Pain in right shoulder: Secondary | ICD-10-CM

## 2022-01-30 DIAGNOSIS — M6281 Muscle weakness (generalized): Secondary | ICD-10-CM

## 2022-01-30 NOTE — Therapy (Signed)
OUTPATIENT PHYSICAL THERAPY TREATMENT NOTE    Patient Name: [name] [name] MRN: [id number] DOB:Jul 11, 1956, 66 y.o., female Today's Date: 01/30/2022   END OF SESSION:   PT End of Session - 01/30/22 1049     Visit Number 11    Number of Visits 20    Date for PT Re-Evaluation 03/06/22    Authorization Type Medicare    Progress Note Due on Visit 20    PT Start Time 1057    PT Stop Time 1147    PT Time Calculation (min) 50 min    Activity Tolerance Patient tolerated treatment well;Patient limited by pain   pain in mobility   Behavior During Therapy [facility] for tasks assessed/performed              Past Medical History:  Diagnosis Date   Allergy    seasonal   Anemia    Anxiety    takes Lexapro daily   Arthritis    "back, from neck down pass my bra area" (03/25/2017)   Asthma    Bartholin gland cyst 08/29/2011   Bruises easily    pt is on Effient   Chronic back pain    herniated nucleus pulposus   Chronic back pain    "neck to bra area; lower back" (03/25/2017)   Chronic kidney disease    recurrent UTI's this year 2022   COPD (chronic obstructive pulmonary disease) (HCC)    early stages   Coronary artery disease    Depression    takes Klonopin daily   Diabetes mellitus without complication (HCC)    Diverticulosis    Fibroadenoma of left breast    GERD (gastroesophageal reflux disease)    takes Nexium daily   H/O hiatal hernia    Heart attack ([place]) 2011   Hemorrhoids    Hernia    Hyperlipidemia    takes Lipitor daily   Hypertension    takes Losartan daily and Labetalol bid   Hypothyroidism    takes Synthroid daily   Insomnia    hydroxyzine prn   Joint pain    Pneumonia    "couple times" (03/25/2017)   Pre-diabetes    "just found out 1 wk ago" (03/25/2017)   Psoriasis    elbows,knees,back   Shortness of breath    with exertion   Slowing of urinary stream    Stress incontinence    Past Surgical History:  Procedure Laterality Date   [place]   "left one of my ovaries"   BACK SURGERY     BIOPSY  05/27/2019   Procedure: BIOPSY;  Surgeon: Daneil Dolin, MD;  [facility];  Service: Endoscopy;;   COLONOSCOPY  2011   Dr. Ardis Hughs: Mild diverticulosis, descending diminutive colon polyp (not retrieved), next colonoscopy 10 years   CORONARY ANGIOPLASTY WITH STENT PLACEMENT  2011 X2   "regular stents didn't work; had to go back in in ~ 1 month and put in medicated stents"   DILATION AND CURETTAGE OF UTERUS     ESOPHAGOGASTRODUODENOSCOPY     ESOPHAGOGASTRODUODENOSCOPY (EGD) WITH PROPOFOL N/A 05/27/2019   normal esophagus, dilation, erosive gastropathy s/p biopsy, normal duodenum. Negative H.pylori.    LEFT HEART CATH AND CORONARY ANGIOGRAPHY N/A 03/26/2017   Procedure: LEFT HEART CATH AND CORONARY ANGIOGRAPHY;  Surgeon: Belva Crome, MD;  Location: [place] [facility];  Service: Cardiovascular;  Laterality: N/A;   LUMBAR LAMINECTOMY/DECOMPRESSION MICRODISCECTOMY  08/05/2011  Procedure: LUMBAR LAMINECTOMY/DECOMPRESSION MICRODISCECTOMY;  Surgeon: Otilio Connors, MD;  Location: [place] [facility];  Service: Neurosurgery;  Laterality: Right;  Right Lumbar four-five extraforaminal discectomy   MALONEY DILATION N/A 05/27/2019   Procedure: Venia Minks DILATION;  Surgeon: Daneil Dolin, MD;  Location: [facility];  Service: Endoscopy;  Laterality: N/A;  54   SHOULDER ARTHROSCOPY WITH ROTATOR CUFF REPAIR AND OPEN BICEPS TENODESIS Right 12/24/2021   Procedure: RIGHT SHOULDER ARTHROSCOPY WITH ROTATOR CUFF REPAIR AND BICEPS TENODESIS;  Surgeon: Marybelle Killings, MD;  Location: [place];  Service: Orthopedics;  Laterality: Right;   TONSILLECTOMY     as a child   UPPER GASTROINTESTINAL ENDOSCOPY     Patient Active Problem List   Diagnosis Date Noted   Nontraumatic complete tear of right rotator cuff    Superior glenoid labrum lesion of left shoulder    Iron deficiency anemia 11/02/2021    Major depressive disorder, recurrent, in full remission with anxious distress ([place]) 05/31/2021   Rotator cuff tendinitis, right 05/08/2021   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   Chronic neck pain 02/26/2021   GAD (generalized anxiety disorder) 01/08/2021   Acute cystitis without hematuria 11/07/2020   Influenza vaccine needed 06/15/2020   Abdominal pain, epigastric 04/28/2019   Esophageal dysphagia 04/28/2019   Bipolar I disorder ([place]) 09/17/2018   Insomnia 10/28/2017   Prediabetes 05/28/2017   QT prolongation 03/25/2017   Encounter for screening mammogram for breast cancer 09/11/2015   Psoriasis 06/22/2015   COPD (chronic obstructive pulmonary disease) (HCC)GOLD E 05/18/2015   Anxiety and depression 07/10/2013   Essential hypertension, benign 06/07/2013   Dyslipidemia 06/07/2013   Asthma, chronic 06/07/2013   Emphysema lung ([place]) 06/07/2013   Chronic back pain 06/07/2013   CAD S/P percutaneous coronary angioplasty 06/07/2013   GERD (gastroesophageal reflux disease) 06/07/2013   Breast lump on left side at 3 o'clock position 10/13/2012   S/P abdominal hysterectomy and right salpingo-oophorectomy 08/29/2011   TOBACCO ABUSE 07/17/2009   Chronic rhinitis 07/17/2009   Lung nodule < 6cm on CT 07/17/2009   ALLERGY, FOOD 07/17/2009   Hypothyroidism 12/22/2008     THERAPY DIAG:  Chronic right shoulder pain  Muscle weakness (generalized)  Stiffness of right shoulder, not elsewhere classified  Localized edema  PCP: Troy Sine MD   REFERRING PROVIDER: Marybelle Killings, MD   REFERRING DIAG: M75.81 (ICD-10-CM) - Rotator cuff tendinitis, right   Rationale for Evaluation and Treatment Rehabilitation   ONSET DATE: Surgery 12/24/2021   SUBJECTIVE:  SUBJECTIVE STATEMENT: Pt indicated  having some pain at rest upon arrival and took pain medicine/muscle relaxer before arrival today.  Pt indicated this morning waking was higher pain then now.  Pt indicated seeing MD (note indicated AAROM transitioning allowed).    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    OBJECTIVE:    PATIENT SURVEYS:   12/26/2021: FOTO intake:  20  predicted:  52   POSTURE: 12/26/2021 Rounded shoulder, forward head posture.  Rt arm in sling upon arrival              Localized edema Rt shoulder visibly    UPPER EXTREMITY ROM:    ROM Right 12/26/2021 Passive in supine - All limited by pain Right 01/02/2022  PROM - limited by pain response Right 01/09/2022  PROM supine PROM Right 01/11/2022 assessed supine 01/18/2022 PROM in supine 01/25/2022: PROM in supine  Shoulder flexion 10 100 in supine 125 in spuine  115 supine 125 supine 125 supine  Shoulder extension         Shoulder abduction 5 90 in supine    99 in supine  Shoulder adduction         Shoulder internal rotation To belly With arm at side   55 degrees assessed at 70 degrees abduction    Shoulder external rotation -20 With arm at side 0 in 30 deg abduction  42 in 30 deg abduction supine 50 degrees assessed at 70 degrees abduction 53 degrees assessed at 70 degrees abduction 50 degrees assessed at 60 degrees abduction  Elbow flexion WFL passively       Elbow extension -20 deg c pain in shoulder passively       (Blank  rows = not tested)   UPPER EXTREMITY MMT:                         12/26/2021:  not tested due to protocol for surgery - Rt shoulder MMT Right   Left    Shoulder flexion      Shoulder extension      Shoulder abduction      Shoulder adduction      Shoulder internal rotation      Shoulder external rotation      Middle trapezius      Lower trapezius      Elbow flexion      Elbow extension      Wrist flexion      Wrist extension      Wrist ulnar deviation      Wrist radial deviation      Wrist pronation      Wrist supination      Grip strength (lbs)      (Blank rows = not tested)   SHOULDER SPECIAL TESTS:             12/26/2021: none tested today   JOINT MOBILITY TESTING:  12/26/2021 heavy muscle guarding and pain limited full assessment for Rt shoulder.  PALPATION:  6/14/2023Tenderness throughout Rt shoulder region in muscles and joint, incision sites.               TODAY'S TREATMENT:   01/30/2022 Manual therapy:  Rt shoulder PROM gentle to tolerance in flexion, ER, ABD.  G1-G2 mid/end range mobilizations to Rt GH joint.     Therex:  Pulley flexion 3 mins, scaption 3 mins c arm close to body c movement to tolerance  Supine AAROM flexion 2 x 10 with 2-3 second holds 1 lb bar Supine AAROM ER in 45 deg abduction 5 sec hold x 10  Supine AAROM protraction c 1 lb bar 5 sec hold x 10   Additional time spent in review of new HEP progressions c cues verbal and visually.    Modalities:  Vasopnuematic device X 10 min to Rt shoulder, 34 deg, Low compression  01/25/2022 Manual therapy:  Rt shoulder PROM gentle to tolerance in flexion, ER, ABD.  G1-G2 mid/end range mobilizations to Rt GH joint.   Longer duration end range stretching in ER and flexion mobility for motion gains /relaxed   Therex:  Supine Passive range c Lt UE lifting Rt into flexion to tolerance 2-3 sec hold x 15  Seated pendulum arm supported by Lt in sling clockwise circles, counterclockwise circles x 25  each   Modalities:  Vasopnuematic device X 10 min to Rt shoulder, 34 deg, medium compression  01/25/2022 Manual therapy:  Rt shoulder PROM gentle to tolerance in flexion, ER, ABD.  G1-G2 mid/end range mobilizations to Rt GH joint.   Longer duration end range stretching in ER and flexion mobility for motion gains /relaxed   Therex:  Supine Passive range c Lt UE lifting Rt into flexion to tolerance 2-3 sec hold x 15  Seated pendulum arm supported by Lt in sling clockwise circles, counterclockwise circles x 25 each   Modalities:  Vasopnuematic device X 10 min to Rt shoulder, 34 deg, medium compression  01/23/2022 Manual therapy:  Rt shoulder PROM gentle to tolerance in flexion, ER, ABD.  G2-g3 mid/end range mobilizations to Rt GH joint.   Longer duration end range stretching in ER and flexion mobility for motion gains /relaxed   Therex:  Supine Passive range c Lt UE lifting Rt into flexion to tolerance 2-3 sec hold 2 x10  Supine Cross arm stretch Rt 15 sec x 5   Modalities:  Vasopnuematic device X 10 min to Rt shoulder, 34 deg, medium compression   PATIENT EDUCATION: 01/30/2022 Education details: HEP progression Person educated: Patient Education method: Consulting civil engineer, Media planner, Verbal cues, and Handouts Education comprehension: verbalized understanding, returned demonstration, and verbal cues required     HOME EXERCISE PROGRAM: Access Code: [email or web address] URL: [email or web address] Date: 01/30/2022 Prepared by: Scot Jun  Exercises - Seated Scapular Retraction  - 3-5 x daily - 7 x weekly - 1 sets - 10 reps - 3-5 hold - Seated Elbow Flexion and Extension AROM  - 2-3 x daily - 7 x weekly - 1 sets - 10 reps - Supine Shoulder Flexion Extension AAROM with Dowel  - 2-3 x daily - 7 x weekly - 1-2 sets - 10-15 reps - 3 hold - Supine Shoulder External Rotation with Dowel (Mirrored)  - 2-3 x daily - 7 x weekly - 1 sets - 10 reps - 5 hold - Supine Shoulder Protraction  with Dowel  - 1-2 x daily - 7 x weekly - 1 sets - 10 reps - 5 hold - Seated Shoulder Flexion AAROM with Pulley  Behind (Mirrored)  - 2-3 x daily - 7 x weekly - 1 sets - 3 mins hold - Seated Shoulder Scaption AAROM with Pulley at Side (Mirrored)  - 2-3 x daily - 7 x weekly - 1 sets - 3 mins hold   ASSESSMENT:   CLINICAL IMPRESSION: Inclusion of some transitioning to include AAROM range follow MD visit.  Fair overall tolerance with pain limitation similar to passive range tolerance.   Able to perform to acceptable amount for addition to HEP.  Continued skilled PT services c focus on improved mobility and muscle activation improvements warranted at this time.      OBJECTIVE IMPAIRMENTS decreased activity tolerance, decreased coordination, decreased endurance, decreased mobility, decreased ROM, decreased strength, hypomobility, increased edema, increased fascial restrictions, impaired perceived functional ability, impaired flexibility, impaired UE functional use, improper body mechanics, and pain.    ACTIVITY LIMITATIONS carrying, lifting, bed mobility, bathing, toileting, dressing, reach over head, hygiene/grooming, and locomotion level   PARTICIPATION LIMITATIONS: meal prep, cleaning, laundry, interpersonal relationship, community activity, occupation, and yard work   PERSONAL FACTORS  Chronic back pain, CKD, COPD, CAD, depression, DM, GERD  are also affecting patient's functional outcome.    REHAB POTENTIAL: Good   CLINICAL DECISION MAKING: Stable/uncomplicated   EVALUATION COMPLEXITY: Low     GOALS: Goals reviewed with patient? Yes   Short term PT Goals (target date for Short term goals are 3 weeks 01/16/2022) Patient will demonstrate independent use of home exercise program to maintain progress from in clinic treatments. Goal status: MET 01/16/22   Long term PT goals (target dates for all long term goals are 10 weeks  03/06/2022 )   1. Patient will demonstrate/report pain at worst less  than or equal to 2/10 to facilitate minimal limitation in daily activity secondary to pain symptoms. Goal status: on going - assessed 01/25/2022   2. Patient will demonstrate independent use of home exercise program to facilitate ability to maintain/progress functional gains from skilled physical therapy services. Goal status: on going - assessed 01/25/2022   3. Patient will demonstrate FOTO outcome > or = 52 % to indicate reduced disability due to condition. Goal status: on going - assessed 01/25/2022   4.  Patient will demonstrate Rt Dwight joint mobility WFL to facilitate usual self care, dressing, reaching overhead at PLOF s limitation due to symptoms.  Goal status: on going - assessed 01/25/2022   5.  Patient will demonstrate Rt UE MMT 4/5 or greater throughout to facilitate usual lifting, carrying in functional activity to PLOF s limitation.   Goal status: on going - assessed 01/25/2022   6.  Patient will demonstrate ability to sleep s restriction due to symptom.  Goal status: on going - assessed 01/25/2022     PLAN: PT FREQUENCY:  2-3x/week , then 1-2x/week as able   PT DURATION: 10 weeks   PLANNED INTERVENTIONS: Therapeutic exercises, Therapeutic activity, Neuro Muscular re-education, Balance training, Gait training, Patient/Family education, Joint mobilization, Stair training, DME instructions, Dry Needling, Electrical stimulation, Cryotherapy, Moist heat, Taping, Ultrasound, Ionotophoresis 27m/ml Dexamethasone, and Manual therapy.  All included unless contraindicated   PLAN FOR NEXT SESSION: AAROM allowed at this time per MD note.    MScot Jun PT, DPT, OCS, ATC 01/30/22  11:37 AM

## 2022-02-01 ENCOUNTER — Encounter: Payer: Self-pay | Admitting: Rehabilitative and Restorative Service Providers"

## 2022-02-01 ENCOUNTER — Ambulatory Visit (INDEPENDENT_AMBULATORY_CARE_PROVIDER_SITE_OTHER): Payer: Medicare Other | Admitting: Rehabilitative and Restorative Service Providers"

## 2022-02-01 DIAGNOSIS — M25611 Stiffness of right shoulder, not elsewhere classified: Secondary | ICD-10-CM

## 2022-02-01 DIAGNOSIS — M6281 Muscle weakness (generalized): Secondary | ICD-10-CM | POA: Diagnosis not present

## 2022-02-01 DIAGNOSIS — G8929 Other chronic pain: Secondary | ICD-10-CM | POA: Diagnosis not present

## 2022-02-01 DIAGNOSIS — M25511 Pain in right shoulder: Secondary | ICD-10-CM

## 2022-02-01 DIAGNOSIS — R600 Localized edema: Secondary | ICD-10-CM | POA: Diagnosis not present

## 2022-02-01 NOTE — Therapy (Signed)
OUTPATIENT PHYSICAL THERAPY TREATMENT NOTE    Patient Name: [name] [name] MRN: [id number] DOB:[birth year], 66 y.o., female Today's Date: 02/01/2022   END OF SESSION:   PT End of Session - 02/01/22 1253     Visit Number 12    Number of Visits 20    Date for PT Re-Evaluation 03/06/22    Authorization Type Medicare    Progress Note Due on Visit 20    PT Start Time 1300    PT Stop Time 3016    PT Time Calculation (min) 49 min    Activity Tolerance Patient tolerated treatment well   pain in mobility   Behavior During Therapy WFL for tasks assessed/performed               Past Medical History:  Diagnosis Date   Allergy    seasonal   Anemia    Anxiety    takes Lexapro daily   Arthritis    "back, from neck down pass my bra area" (03/25/2017)   Asthma    Bartholin gland cyst 08/29/2011   Bruises easily    pt is on Effient   Chronic back pain    herniated nucleus pulposus   Chronic back pain    "neck to bra area; lower back" (03/25/2017)   Chronic kidney disease    recurrent UTI's this year 2022   COPD (chronic obstructive pulmonary disease) (HCC)    early stages   Coronary artery disease    Depression    takes Klonopin daily   Diabetes mellitus without complication (HCC)    Diverticulosis    Fibroadenoma of left breast    GERD (gastroesophageal reflux disease)    takes Nexium daily   H/O hiatal hernia    Heart attack ([place]) 2011   Hemorrhoids    Hernia    Hyperlipidemia    takes Lipitor daily   Hypertension    takes Losartan daily and Labetalol bid   Hypothyroidism    takes Synthroid daily   Insomnia    hydroxyzine prn   Joint pain    Pneumonia    "couple times" (03/25/2017)   Pre-diabetes    "just found out 1 wk ago" (03/25/2017)   Psoriasis    elbows,knees,back   Shortness of breath    with exertion   Slowing of urinary stream    Stress incontinence    Past Surgical History:  Procedure Laterality Date   [place]   "left one of my ovaries"   BACK SURGERY     BIOPSY  05/27/2019   Procedure: BIOPSY;  Surgeon: Daneil Dolin, MD;  Location: [facility];  Service: Endoscopy;;   COLONOSCOPY  2011   Dr. Ardis Hughs: Mild diverticulosis, descending diminutive colon polyp (not retrieved), next colonoscopy 10 years   CORONARY ANGIOPLASTY WITH STENT PLACEMENT  2011 X2   "regular stents didn't work; had to go back in in ~ 1 month and put in medicated stents"   DILATION AND CURETTAGE OF UTERUS     ESOPHAGOGASTRODUODENOSCOPY     ESOPHAGOGASTRODUODENOSCOPY (EGD) WITH PROPOFOL N/A 05/27/2019   normal esophagus, dilation, erosive gastropathy s/p biopsy, normal duodenum. Negative H.pylori.    LEFT HEART CATH AND CORONARY ANGIOGRAPHY N/A 03/26/2017   Procedure: LEFT HEART CATH AND CORONARY ANGIOGRAPHY;  Surgeon: Belva Crome, MD;  Location: [facility];  Service: Cardiovascular;  Laterality: N/A;   LUMBAR LAMINECTOMY/DECOMPRESSION MICRODISCECTOMY  08/05/2011   Procedure:  LUMBAR LAMINECTOMY/DECOMPRESSION MICRODISCECTOMY;  Surgeon: Otilio Connors, MD;  Location: [facility];  Service: Neurosurgery;  Laterality: Right;  Right Lumbar four-five extraforaminal discectomy   MALONEY DILATION N/A 05/27/2019   Procedure: Venia Minks DILATION;  Surgeon: Daneil Dolin, MD;  Location: [facility];  Service: Endoscopy;  Laterality: N/A;  54   SHOULDER ARTHROSCOPY WITH ROTATOR CUFF REPAIR AND OPEN BICEPS TENODESIS Right 12/24/2021   Procedure: RIGHT SHOULDER ARTHROSCOPY WITH ROTATOR CUFF REPAIR AND BICEPS TENODESIS;  Surgeon: Marybelle Killings, MD;  Location: [facility];  Service: Orthopedics;  Laterality: Right;   TONSILLECTOMY     as a child   UPPER GASTROINTESTINAL ENDOSCOPY     Patient Active Problem List   Diagnosis Date Noted   Nontraumatic complete tear of right rotator cuff    Superior glenoid labrum lesion of left shoulder    Iron deficiency anemia 11/02/2021   Major depressive disorder,  recurrent, in full remission with anxious distress ([place]) 05/31/2021   Rotator cuff tendinitis, right 05/08/2021   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   Chronic neck pain 02/26/2021   GAD (generalized anxiety disorder) 01/08/2021   Acute cystitis without hematuria 11/07/2020   Influenza vaccine needed 06/15/2020   Abdominal pain, epigastric 04/28/2019   Esophageal dysphagia 04/28/2019   Bipolar I disorder ([place]) 09/17/2018   Insomnia 10/28/2017   Prediabetes 05/28/2017   QT prolongation 03/25/2017   Encounter for screening mammogram for breast cancer 09/11/2015   Psoriasis 06/22/2015   COPD (chronic obstructive pulmonary disease) (HCC)GOLD E 05/18/2015   Anxiety and depression 07/10/2013   Essential hypertension, benign 06/07/2013   Dyslipidemia 06/07/2013   Asthma, chronic 06/07/2013   Emphysema lung ([place]) 06/07/2013   Chronic back pain 06/07/2013   CAD S/P percutaneous coronary angioplasty 06/07/2013   GERD (gastroesophageal reflux disease) 06/07/2013   Breast lump on left side at 3 o'clock position 10/13/2012   S/P abdominal hysterectomy and right salpingo-oophorectomy 08/29/2011   TOBACCO ABUSE 07/17/2009   Chronic rhinitis 07/17/2009   Lung nodule < 6cm on CT 07/17/2009   ALLERGY, FOOD 07/17/2009   Hypothyroidism 12/22/2008     THERAPY DIAG:  Chronic right shoulder pain  Muscle weakness (generalized)  Stiffness of right shoulder, not elsewhere classified  Localized edema  PCP: Troy Sine MD   REFERRING PROVIDER: Marybelle Killings, MD   REFERRING DIAG: M75.81 (ICD-10-CM) - Rotator cuff tendinitis, right   Rationale for Evaluation and Treatment Rehabilitation   ONSET DATE: Surgery 12/24/2021   SUBJECTIVE:  SUBJECTIVE STATEMENT: Pt indicated pain up to 7/10 at times.  Pt  indicated getting sling off for sleeping at times.  Pt indicated no worsening after new stuff last visit.   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    OBJECTIVE:    PATIENT SURVEYS:   12/26/2021: FOTO intake:  20  predicted:  52   POSTURE: 12/26/2021 Rounded shoulder, forward head posture.  Rt arm in sling upon arrival              Localized edema Rt shoulder visibly    UPPER EXTREMITY ROM:    ROM Right 12/26/2021 Passive in supine - All limited by pain Right 01/02/2022  PROM - limited by pain response Right 01/09/2022  PROM supine PROM Right 01/11/2022 assessed supine 01/18/2022 PROM in supine 01/25/2022: PROM in supine 02/01/2022: AAROM  Shoulder flexion 10 100 in supine 125 in spuine  115 supine 125 supine 125 supine 145 supine  Shoulder extension          Shoulder abduction 5 90 in supine    99 in supine   Shoulder adduction          Shoulder internal rotation To belly With arm at side   55 degrees assessed at 70 degrees abduction     Shoulder external rotation -20 With arm at side 0 in 30 deg abduction  42 in 30 deg abduction supine 50 degrees assessed at 70 degrees abduction 53 degrees assessed at 70 degrees abduction 50 degrees assessed at 60 degrees abduction   Elbow flexion WFL passively        Elbow extension -20 deg c pain in shoulder passively        (Blank rows = not tested)   UPPER EXTREMITY MMT:                         12/26/2021:  not  tested due to protocol for surgery - Rt shoulder MMT Right   Left    Shoulder flexion      Shoulder extension      Shoulder abduction      Shoulder adduction      Shoulder internal rotation      Shoulder external rotation      Middle trapezius      Lower trapezius      Elbow flexion      Elbow extension      Wrist flexion      Wrist extension      Wrist ulnar deviation      Wrist radial deviation      Wrist pronation      Wrist supination      Grip strength (lbs)      (Blank rows = not tested)   SHOULDER SPECIAL TESTS:             12/26/2021: none tested today   JOINT MOBILITY TESTING:  12/26/2021 heavy muscle guarding and pain limited full assessment for Rt shoulder.  PALPATION:  6/14/2023Tenderness throughout Rt shoulder region in muscles and joint, incision sites.               TODAY'S TREATMENT:  02/01/2022 Manual therapy:  Rt shoulder PROM gentle to tolerance in flexion, ER, ABD.  G1-G2 mid/end range mobilizations to Rt GH joint.     Therex:  UBE AAROM fwd/back 3 mins each way  Pulley flexion 3 mins, scaption 3 mins c arm close to body c movement to tolerance  Supine AAROM flexion 2 x 15 with 2-3 second holds 1 lb bar Supine AAROM protraction c 1 lb bar 5 sec hold x 10   Standing rows green 2x10 Standing UE ranger flexion x 10   Additional time spent in review of new HEP progressions c cues verbal and visually.    Modalities:  Vasopnuematic device X 10 min to Rt shoulder, 34 deg, Low compression  01/30/2022 Manual therapy:  Rt shoulder PROM gentle to tolerance in flexion, ER, ABD.  G1-G2 mid/end range mobilizations to Rt GH joint.     Therex:  Pulley flexion 3 mins, scaption 3 mins c arm close to body c movement to tolerance  Supine AAROM flexion 2 x 10 with 2-3 second holds 1 lb bar Supine AAROM ER in 45 deg abduction 5 sec hold x 10  Supine AAROM protraction c 1 lb bar 5 sec hold x 10   Additional time spent in review of new HEP progressions c cues  verbal and visually.    Modalities:  Vasopnuematic device X 10 min to Rt shoulder, 34 deg, Low compression  01/25/2022 Manual therapy:  Rt shoulder PROM gentle to tolerance in flexion, ER, ABD.  G1-G2 mid/end range mobilizations to Rt GH joint.   Longer duration end range stretching in ER and flexion mobility for motion gains /relaxed   Therex:  Supine Passive range c Lt UE lifting Rt into flexion to tolerance 2-3 sec hold x 15  Seated pendulum arm supported by Lt in sling clockwise circles, counterclockwise circles x 25 each   Modalities:  Vasopnuematic device X 10 min to Rt shoulder, 34 deg, medium compression  01/25/2022 Manual therapy:  Rt shoulder PROM gentle to tolerance in flexion, ER, ABD.  G1-G2 mid/end range mobilizations to Rt GH joint.   Longer duration end range stretching in ER and flexion mobility for motion gains /relaxed   Therex:  Supine Passive range c Lt UE lifting Rt into flexion to tolerance 2-3 sec hold x 15  Seated pendulum arm supported by Lt in sling clockwise circles, counterclockwise circles x 25 each   Modalities:  Vasopnuematic device X 10 min to Rt shoulder, 34 deg, medium compression  PATIENT EDUCATION: 01/30/2022 Education details: HEP progression Person educated: Patient Education method: Consulting civil engineer, Media planner, Verbal cues, and Handouts Education comprehension: verbalized understanding, returned demonstration, and verbal cues required     HOME EXERCISE PROGRAM: Access Code: [email or web address] URL: [email or web address] Date: 01/30/2022 Prepared by: Scot Jun  Exercises - Seated Scapular Retraction  - 3-5 x daily - 7 x weekly - 1 sets - 10 reps - 3-5 hold - Seated Elbow Flexion and Extension AROM  - 2-3 x daily - 7 x weekly - 1 sets - 10 reps - Supine Shoulder Flexion Extension AAROM with Dowel  - 2-3 x daily - 7 x weekly - 1-2 sets - 10-15 reps - 3 hold - Supine Shoulder External Rotation with Dowel (Mirrored)  - 2-3 x daily -  7 x weekly - 1 sets -  10 reps - 5 hold - Supine Shoulder Protraction with Dowel  - 1-2 x daily - 7 x weekly - 1 sets - 10 reps - 5 hold - Seated Shoulder Flexion AAROM with Pulley Behind (Mirrored)  - 2-3 x daily - 7 x weekly - 1 sets - 3 mins hold - Seated Shoulder Scaption AAROM with Pulley at Side (Mirrored)  - 2-3 x daily - 7 x weekly - 1 sets - 3 mins hold   ASSESSMENT:   CLINICAL IMPRESSION: Continued cues required during intervention to provide proper techniques of movement to accomplish movement patterns.  Improving tolerance to activity and mobility with each visits.   Continued impairment noted in mobility and strength that can impact daily activity (indicated continued skilled PT services)     OBJECTIVE IMPAIRMENTS decreased activity tolerance, decreased coordination, decreased endurance, decreased mobility, decreased ROM, decreased strength, hypomobility, increased edema, increased fascial restrictions, impaired perceived functional ability, impaired flexibility, impaired UE functional use, improper body mechanics, and pain.    ACTIVITY LIMITATIONS carrying, lifting, bed mobility, bathing, toileting, dressing, reach over head, hygiene/grooming, and locomotion level   PARTICIPATION LIMITATIONS: meal prep, cleaning, laundry, interpersonal relationship, community activity, occupation, and yard work   PERSONAL FACTORS  Chronic back pain, CKD, COPD, CAD, depression, DM, GERD  are also affecting patient's functional outcome.    REHAB POTENTIAL: Good   CLINICAL DECISION MAKING: Stable/uncomplicated   EVALUATION COMPLEXITY: Low     GOALS: Goals reviewed with patient? Yes   Short term PT Goals (target date for Short term goals are 3 weeks 01/16/2022) Patient will demonstrate independent use of home exercise program to maintain progress from in clinic treatments. Goal status: MET 01/16/22   Long term PT goals (target dates for all long term goals are 10 weeks  03/06/2022 )   1.  Patient will demonstrate/report pain at worst less than or equal to 2/10 to facilitate minimal limitation in daily activity secondary to pain symptoms. Goal status: on going - assessed 01/25/2022   2. Patient will demonstrate independent use of home exercise program to facilitate ability to maintain/progress functional gains from skilled physical therapy services. Goal status: on going - assessed 01/25/2022   3. Patient will demonstrate FOTO outcome > or = 52 % to indicate reduced disability due to condition. Goal status: on going - assessed 01/25/2022   4.  Patient will demonstrate Rt [place] joint mobility WFL to facilitate usual self care, dressing, reaching overhead at PLOF s limitation due to symptoms.  Goal status: on going - assessed 01/25/2022   5.  Patient will demonstrate Rt UE MMT 4/5 or greater throughout to facilitate usual lifting, carrying in functional activity to PLOF s limitation.   Goal status: on going - assessed 01/25/2022   6.  Patient will demonstrate ability to sleep s restriction due to symptom.  Goal status: on going - assessed 01/25/2022     PLAN: PT FREQUENCY:  2-3x/week , then 1-2x/week as able   PT DURATION: 10 weeks   PLANNED INTERVENTIONS: Therapeutic exercises, Therapeutic activity, Neuro Muscular re-education, Balance training, Gait training, Patient/Family education, Joint mobilization, Stair training, DME instructions, Dry Needling, Electrical stimulation, Cryotherapy, Moist heat, Taping, Ultrasound, Ionotophoresis 51m/ml Dexamethasone, and Manual therapy.  All included unless contraindicated   PLAN FOR NEXT SESSION: AAROM progression.    MScot Jun PT, DPT, OCS, ATC 02/01/22  1:42 PM

## 2022-02-06 ENCOUNTER — Ambulatory Visit (INDEPENDENT_AMBULATORY_CARE_PROVIDER_SITE_OTHER): Payer: Medicare Other | Admitting: Rehabilitative and Restorative Service Providers"

## 2022-02-06 ENCOUNTER — Encounter: Payer: Self-pay | Admitting: Rehabilitative and Restorative Service Providers"

## 2022-02-06 DIAGNOSIS — R600 Localized edema: Secondary | ICD-10-CM | POA: Diagnosis not present

## 2022-02-06 DIAGNOSIS — G8929 Other chronic pain: Secondary | ICD-10-CM

## 2022-02-06 DIAGNOSIS — M25511 Pain in right shoulder: Secondary | ICD-10-CM | POA: Diagnosis not present

## 2022-02-06 DIAGNOSIS — M25611 Stiffness of right shoulder, not elsewhere classified: Secondary | ICD-10-CM

## 2022-02-06 DIAGNOSIS — M6281 Muscle weakness (generalized): Secondary | ICD-10-CM

## 2022-02-06 NOTE — Therapy (Signed)
OUTPATIENT PHYSICAL THERAPY TREATMENT NOTE    Patient Name: [name] [name] MRN: [id number] DOB:[birth year], 66 y.o., female Today's Date: 02/06/2022   END OF SESSION:   PT End of Session - 02/06/22 1056     Visit Number 13    Number of Visits 20    Date for PT Re-Evaluation 03/06/22    Authorization Type Medicare    Progress Note Due on Visit 20    PT Start Time 1055    PT Stop Time 1135    PT Time Calculation (min) 40 min    Activity Tolerance Patient tolerated treatment well   pain in mobility   Behavior During Therapy WFL for tasks assessed/performed                Past Medical History:  Diagnosis Date   Allergy    seasonal   Anemia    Anxiety    takes Lexapro daily   Arthritis    "back, from neck down pass my bra area" (03/25/2017)   Asthma    Bartholin gland cyst 08/29/2011   Bruises easily    pt is on Effient   Chronic back pain    herniated nucleus pulposus   Chronic back pain    "neck to bra area; lower back" (03/25/2017)   Chronic kidney disease    recurrent UTI's this year 2022   COPD (chronic obstructive pulmonary disease) (HCC)    early stages   Coronary artery disease    Depression    takes Klonopin daily   Diabetes mellitus without complication ([place])    Diverticulosis    Fibroadenoma of left breast    GERD (gastroesophageal reflux disease)    takes Nexium daily   H/O hiatal hernia    Heart attack ([place]) 2011   Hemorrhoids    Hernia    Hyperlipidemia    takes Lipitor daily   Hypertension    takes Losartan daily and Labetalol bid   Hypothyroidism    takes Synthroid daily   Insomnia    hydroxyzine prn   Joint pain    Pneumonia    "couple times" (03/25/2017)   Pre-diabetes    "just found out 1 wk ago" (03/25/2017)   Psoriasis    elbows,knees,back   Shortness of breath    with exertion   Slowing of urinary stream    Stress incontinence    Past Surgical History:  Procedure Laterality Date   [place]   "left one of my ovaries"   BACK SURGERY     BIOPSY  05/27/2019   Procedure: BIOPSY;  Surgeon: Daneil Dolin, MD;  Location: [facility];  Service: Endoscopy;;   COLONOSCOPY  2011   Dr. Ardis Hughs: Mild diverticulosis, descending diminutive colon polyp (not retrieved), next colonoscopy 10 years   CORONARY ANGIOPLASTY WITH STENT PLACEMENT  2011 X2   "regular stents didn't work; had to go back in in ~ 1 month and put in medicated stents"   DILATION AND CURETTAGE OF UTERUS     ESOPHAGOGASTRODUODENOSCOPY     ESOPHAGOGASTRODUODENOSCOPY (EGD) WITH PROPOFOL N/A 05/27/2019   normal esophagus, dilation, erosive gastropathy s/p biopsy, normal duodenum. Negative H.pylori.    LEFT HEART CATH AND CORONARY ANGIOGRAPHY N/A 03/26/2017   Procedure: LEFT HEART CATH AND CORONARY ANGIOGRAPHY;  Surgeon: Belva Crome, MD;  Location: [facility];  Service: Cardiovascular;  Laterality: N/A;   LUMBAR LAMINECTOMY/DECOMPRESSION MICRODISCECTOMY  08/05/2011  Procedure: LUMBAR LAMINECTOMY/DECOMPRESSION MICRODISCECTOMY;  Surgeon: Otilio Connors, MD;  Location: [facility];  Service: Neurosurgery;  Laterality: Right;  Right Lumbar four-five extraforaminal discectomy   MALONEY DILATION N/A 05/27/2019   Procedure: Venia Minks DILATION;  Surgeon: Daneil Dolin, MD;  Location: [facility];  Service: Endoscopy;  Laterality: N/A;  54   SHOULDER ARTHROSCOPY WITH ROTATOR CUFF REPAIR AND OPEN BICEPS TENODESIS Right 12/24/2021   Procedure: RIGHT SHOULDER ARTHROSCOPY WITH ROTATOR CUFF REPAIR AND BICEPS TENODESIS;  Surgeon: Marybelle Killings, MD;  Location: [facility];  Service: Orthopedics;  Laterality: Right;   TONSILLECTOMY     as a child   UPPER GASTROINTESTINAL ENDOSCOPY     Patient Active Problem List   Diagnosis Date Noted   Nontraumatic complete tear of right rotator cuff    Superior glenoid labrum lesion of left shoulder    Iron deficiency anemia 11/02/2021   Major depressive disorder,  recurrent, in full remission with anxious distress ([place]) 05/31/2021   Rotator cuff tendinitis, right 05/08/2021   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   Chronic neck pain 02/26/2021   GAD (generalized anxiety disorder) 01/08/2021   Acute cystitis without hematuria 11/07/2020   Influenza vaccine needed 06/15/2020   Abdominal pain, epigastric 04/28/2019   Esophageal dysphagia 04/28/2019   Bipolar I disorder ([place]) 09/17/2018   Insomnia 10/28/2017   Prediabetes 05/28/2017   QT prolongation 03/25/2017   Encounter for screening mammogram for breast cancer 09/11/2015   Psoriasis 06/22/2015   COPD (chronic obstructive pulmonary disease) (HCC)GOLD E 05/18/2015   Anxiety and depression 07/10/2013   Essential hypertension, benign 06/07/2013   Dyslipidemia 06/07/2013   Asthma, chronic 06/07/2013   Emphysema lung ([place]) 06/07/2013   Chronic back pain 06/07/2013   CAD S/P percutaneous coronary angioplasty 06/07/2013   GERD (gastroesophageal reflux disease) 06/07/2013   Breast lump on left side at 3 o'clock position 10/13/2012   S/P abdominal hysterectomy and right salpingo-oophorectomy 08/29/2011   TOBACCO ABUSE 07/17/2009   Chronic rhinitis 07/17/2009   Lung nodule < 6cm on CT 07/17/2009   ALLERGY, FOOD 07/17/2009   Hypothyroidism 12/22/2008     THERAPY DIAG:  Chronic right shoulder pain  Muscle weakness (generalized)  Stiffness of right shoulder, not elsewhere classified  Localized edema  PCP: Troy Sine MD   REFERRING PROVIDER: Marybelle Killings, MD   REFERRING DIAG: M75.81 (ICD-10-CM) - Rotator cuff tendinitis, right   Rationale for Evaluation and Treatment Rehabilitation   ONSET DATE: Surgery 12/24/2021   SUBJECTIVE:  SUBJECTIVE STATEMENT: Pt indicated no specific worsening of  symptoms since last visit.  Pt indicated she was able to perform new HEP.   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    OBJECTIVE:    PATIENT SURVEYS:   12/26/2021: FOTO intake:  20  predicted:  52   POSTURE: 12/26/2021 Rounded shoulder, forward head posture.  Rt arm in sling upon arrival              Localized edema Rt shoulder visibly    UPPER EXTREMITY ROM:    ROM Right 12/26/2021 Passive in supine - All limited by pain Right 01/02/2022  PROM - limited by pain response Right 01/09/2022  PROM supine PROM Right 01/11/2022 assessed supine 01/18/2022 PROM in supine 01/25/2022: PROM in supine 02/01/2022: AAROM  Shoulder flexion 10 100 in supine 125 in spuine  115 supine 125 supine 125 supine 145 supine  Shoulder extension          Shoulder abduction 5 90 in supine    99 in supine   Shoulder adduction          Shoulder internal rotation To belly With arm at side   55 degrees assessed at 70 degrees abduction     Shoulder external rotation -20 With arm at side 0 in 30 deg abduction  42 in 30 deg abduction supine 50 degrees assessed at 70 degrees abduction 53 degrees assessed at 70 degrees abduction 50 degrees assessed at 60 degrees abduction   Elbow flexion WFL passively        Elbow extension -20 deg c pain in shoulder passively        (Blank rows = not tested)   UPPER EXTREMITY MMT:                         12/26/2021:  not tested due to protocol for surgery -  Rt shoulder MMT Right   Left    Shoulder flexion      Shoulder extension      Shoulder abduction      Shoulder adduction      Shoulder internal rotation      Shoulder external rotation      Middle trapezius      Lower trapezius      Elbow flexion      Elbow extension      Wrist flexion      Wrist extension      Wrist ulnar deviation      Wrist radial deviation      Wrist pronation      Wrist supination      Grip strength (lbs)      (Blank rows = not tested)   SHOULDER SPECIAL TESTS:             12/26/2021: none tested today   JOINT MOBILITY TESTING:  12/26/2021 heavy muscle guarding and pain limited full assessment for Rt shoulder.    PALPATION:  6/14/2023Tenderness throughout Rt shoulder  region in muscles and joint, incision sites.               TODAY'S TREATMENT:  02/06/2022 Therex:  Dwaine Deter fwd/back 3 mins each way lvl 2.0 Pulley flexion 3 mins, scaption 3 mins c arm close to body c movement to tolerance  Supine AAROM flexion x 15 with 2-3 second holds 1 lb bar Supine AROM flexion 2 x 15  Supine protraction hold 5 sec bilateral x 10  Sidelying shoulder abduction AROM 2 x 10  Rt Sidelying shoulder ER AROM 2 x 10 Rt  Standing rows green 2x10   02/01/2022 Manual therapy:  Rt shoulder PROM gentle to tolerance in flexion, ER, ABD.  G1-G2 mid/end range mobilizations to Rt GH joint.     Therex:  UBE AAROM fwd/back 3 mins each way  Pulley flexion 3 mins, scaption 3 mins c arm close to body c movement to tolerance  Supine AAROM flexion 2 x 15 with 2-3 second holds 1 lb bar Supine AAROM protraction c 1 lb bar 5 sec hold x 10   Standing rows green 2x10 Standing UE ranger flexion x 10   Additional time spent in review of new HEP progressions c cues verbal and visually.    Modalities:  Vasopnuematic device X 10 min to Rt shoulder, 34 deg, Low compression  01/30/2022 Manual therapy:  Rt shoulder PROM gentle to tolerance in flexion, ER, ABD.  G1-G2 mid/end range  mobilizations to Rt GH joint.     Therex:  Pulley flexion 3 mins, scaption 3 mins c arm close to body c movement to tolerance  Supine AAROM flexion 2 x 10 with 2-3 second holds 1 lb bar Supine AAROM ER in 45 deg abduction 5 sec hold x 10  Supine AAROM protraction c 1 lb bar 5 sec hold x 10   Additional time spent in review of new HEP progressions c cues verbal and visually.    Modalities:  Vasopnuematic device X 10 min to Rt shoulder, 34 deg, Low compression  01/25/2022 Manual therapy:  Rt shoulder PROM gentle to tolerance in flexion, ER, ABD.  G1-G2 mid/end range mobilizations to Rt GH joint.   Longer duration end range stretching in ER and flexion mobility for motion gains /relaxed   Therex:  Supine Passive range c Lt UE lifting Rt into flexion to tolerance 2-3 sec hold x 15  Seated pendulum arm supported by Lt in sling clockwise circles, counterclockwise circles x 25 each   Modalities:  Vasopnuematic device X 10 min to Rt shoulder, 34 deg, medium compression   PATIENT EDUCATION: 01/30/2022 Education details: HEP progression Person educated: Patient Education method: Consulting civil engineer, Media planner, Verbal cues, and Handouts Education comprehension: verbalized understanding, returned demonstration, and verbal cues required     HOME EXERCISE PROGRAM: Access Code: [email or web address] URL: [email or web address] Date: 01/30/2022 Prepared by: Scot Jun  Exercises - Seated Scapular Retraction  - 3-5 x daily - 7 x weekly - 1 sets - 10 reps - 3-5 hold - Seated Elbow Flexion and Extension AROM  - 2-3 x daily - 7 x weekly - 1 sets - 10 reps - Supine Shoulder Flexion Extension AAROM with Dowel  - 2-3 x daily - 7 x weekly - 1-2 sets - 10-15 reps - 3 hold - Supine Shoulder External Rotation with Dowel (Mirrored)  - 2-3 x daily - 7 x weekly - 1 sets - 10 reps - 5 hold - Supine Shoulder Protraction with Dowel  - 1-2 x daily - 7  x weekly - 1 sets - 10 reps - 5 hold - Seated Shoulder  Flexion AAROM with Pulley Behind (Mirrored)  - 2-3 x daily - 7 x weekly - 1 sets - 3 mins hold - Seated Shoulder Scaption AAROM with Pulley at Side (Mirrored)  - 2-3 x daily - 7 x weekly - 1 sets - 3 mins hold   ASSESSMENT:   CLINICAL IMPRESSION: Encouraged Pt to obtain pulleys for use at home if able.  Able to tolerate and perform similar mobility AROM vs. AAROM today in supine gravity reduced positioning.  Continued skilled PT services indicated to continue progression.      OBJECTIVE IMPAIRMENTS decreased activity tolerance, decreased coordination, decreased endurance, decreased mobility, decreased ROM, decreased strength, hypomobility, increased edema, increased fascial restrictions, impaired perceived functional ability, impaired flexibility, impaired UE functional use, improper body mechanics, and pain.    ACTIVITY LIMITATIONS carrying, lifting, bed mobility, bathing, toileting, dressing, reach over head, hygiene/grooming, and locomotion level   PARTICIPATION LIMITATIONS: meal prep, cleaning, laundry, interpersonal relationship, community activity, occupation, and yard work   PERSONAL FACTORS  Chronic back pain, CKD, COPD, CAD, depression, DM, GERD  are also affecting patient's functional outcome.    REHAB POTENTIAL: Good   CLINICAL DECISION MAKING: Stable/uncomplicated   EVALUATION COMPLEXITY: Low     GOALS: Goals reviewed with patient? Yes   Short term PT Goals (target date for Short term goals are 3 weeks 01/16/2022) Patient will demonstrate independent use of home exercise program to maintain progress from in clinic treatments. Goal status: MET 01/16/22   Long term PT goals (target dates for all long term goals are 10 weeks  03/06/2022 )   1. Patient will demonstrate/report pain at worst less than or equal to 2/10 to facilitate minimal limitation in daily activity secondary to pain symptoms. Goal status: on going - assessed 01/25/2022   2. Patient will demonstrate independent  use of home exercise program to facilitate ability to maintain/progress functional gains from skilled physical therapy services. Goal status: on going - assessed 01/25/2022   3. Patient will demonstrate FOTO outcome > or = 52 % to indicate reduced disability due to condition. Goal status: on going - assessed 01/25/2022   4.  Patient will demonstrate Rt [place] joint mobility WFL to facilitate usual self care, dressing, reaching overhead at PLOF s limitation due to symptoms.  Goal status: on going - assessed 01/25/2022   5.  Patient will demonstrate Rt UE MMT 4/5 or greater throughout to facilitate usual lifting, carrying in functional activity to PLOF s limitation.   Goal status: on going - assessed 01/25/2022   6.  Patient will demonstrate ability to sleep s restriction due to symptom.  Goal status: on going - assessed 01/25/2022     PLAN: PT FREQUENCY:  2-3x/week , then 1-2x/week as able   PT DURATION: 10 weeks   PLANNED INTERVENTIONS: Therapeutic exercises, Therapeutic activity, Neuro Muscular re-education, Balance training, Gait training, Patient/Family education, Joint mobilization, Stair training, DME instructions, Dry Needling, Electrical stimulation, Cryotherapy, Moist heat, Taping, Ultrasound, Ionotophoresis 63m/ml Dexamethasone, and Manual therapy.  All included unless contraindicated   PLAN FOR NEXT SESSION: AAROM- AROM progression, avoiding shrug   MScot Jun PT, DPT, OCS, ATC 02/06/22  11:30 AM

## 2022-02-07 ENCOUNTER — Other Ambulatory Visit: Payer: Self-pay | Admitting: Internal Medicine

## 2022-02-07 ENCOUNTER — Other Ambulatory Visit: Payer: Self-pay

## 2022-02-07 ENCOUNTER — Encounter: Payer: Self-pay | Admitting: Hematology

## 2022-02-07 ENCOUNTER — Other Ambulatory Visit: Payer: Self-pay | Admitting: Pharmacist

## 2022-02-07 DIAGNOSIS — I10 Essential (primary) hypertension: Secondary | ICD-10-CM

## 2022-02-07 DIAGNOSIS — I25118 Atherosclerotic heart disease of native coronary artery with other forms of angina pectoris: Secondary | ICD-10-CM

## 2022-02-07 MED ORDER — AMLODIPINE BESYLATE 5 MG PO TABS
ORAL_TABLET | Freq: Every day | ORAL | 0 refills | Status: DC
  Filled 2022-02-07: qty 90, fill #0
  Filled 2022-02-20: qty 90, 90d supply, fill #0

## 2022-02-07 MED ORDER — TICAGRELOR 60 MG PO TABS
ORAL_TABLET | ORAL | 5 refills | Status: DC
  Filled 2022-02-07: qty 60, 30d supply, fill #0
  Filled 2022-03-22 (×2): qty 60, 30d supply, fill #1
  Filled 2022-04-16: qty 60, 30d supply, fill #2
  Filled 2022-05-23: qty 60, 30d supply, fill #3
  Filled 2022-07-02: qty 120, 60d supply, fill #4

## 2022-02-08 ENCOUNTER — Other Ambulatory Visit: Payer: Self-pay

## 2022-02-08 ENCOUNTER — Ambulatory Visit (INDEPENDENT_AMBULATORY_CARE_PROVIDER_SITE_OTHER): Payer: Medicare Other | Admitting: Rehabilitative and Restorative Service Providers"

## 2022-02-08 ENCOUNTER — Encounter: Payer: Self-pay | Admitting: Rehabilitative and Restorative Service Providers"

## 2022-02-08 DIAGNOSIS — M6281 Muscle weakness (generalized): Secondary | ICD-10-CM | POA: Diagnosis not present

## 2022-02-08 DIAGNOSIS — M25611 Stiffness of right shoulder, not elsewhere classified: Secondary | ICD-10-CM | POA: Diagnosis not present

## 2022-02-08 DIAGNOSIS — M25511 Pain in right shoulder: Secondary | ICD-10-CM

## 2022-02-08 DIAGNOSIS — R600 Localized edema: Secondary | ICD-10-CM | POA: Diagnosis not present

## 2022-02-08 DIAGNOSIS — G8929 Other chronic pain: Secondary | ICD-10-CM

## 2022-02-08 NOTE — Therapy (Signed)
OUTPATIENT PHYSICAL THERAPY TREATMENT NOTE    Patient Name: [name] [name] MRN: [id number] DOB:11-Aug-1955, 66 y.o., female Today's Date: 02/08/2022   END OF SESSION:   PT End of Session - 02/08/22 1101     Visit Number 14    Number of Visits 20    Date for PT Re-Evaluation 03/06/22    Authorization Type Medicare    Progress Note Due on Visit 20    PT Start Time 1100    PT Stop Time 1155    PT Time Calculation (min) 55 min    Activity Tolerance Patient tolerated treatment well   pain in mobility   Behavior During Therapy WFL for tasks assessed/performed             Past Medical History:  Diagnosis Date   Allergy    seasonal   Anemia    Anxiety    takes Lexapro daily   Arthritis    "back, from neck down pass my bra area" (03/25/2017)   Asthma    Bartholin gland cyst 08/29/2011   Bruises easily    pt is on Effient   Chronic back pain    herniated nucleus pulposus   Chronic back pain    "neck to bra area; lower back" (03/25/2017)   Chronic kidney disease    recurrent UTI's this year 2022   COPD (chronic obstructive pulmonary disease) (HCC)    early stages   Coronary artery disease    Depression    takes Klonopin daily   Diabetes mellitus without complication (HCC)    Diverticulosis    Fibroadenoma of left breast    GERD (gastroesophageal reflux disease)    takes Nexium daily   H/O hiatal hernia    Heart attack ([place]) 2011   Hemorrhoids    Hernia    Hyperlipidemia    takes Lipitor daily   Hypertension    takes Losartan daily and Labetalol bid   Hypothyroidism    takes Synthroid daily   Insomnia    hydroxyzine prn   Joint pain    Pneumonia    "couple times" (03/25/2017)   Pre-diabetes    "just found out 1 wk ago" (03/25/2017)   Psoriasis    elbows,knees,back   Shortness of breath    with exertion   Slowing of urinary stream    Stress incontinence    Past Surgical History:  Procedure Laterality Date   [place]   "left one of my ovaries"   BACK SURGERY     BIOPSY  05/27/2019   Procedure: BIOPSY;  Surgeon: Daneil Dolin, MD;  Location: [facility];  Service: Endoscopy;;   COLONOSCOPY  2011   Dr. Ardis Hughs: Mild diverticulosis, descending diminutive colon polyp (not retrieved), next colonoscopy 10 years   CORONARY ANGIOPLASTY WITH STENT PLACEMENT  2011 X2   "regular stents didn't work; had to go back in in ~ 1 month and put in medicated stents"   DILATION AND CURETTAGE OF UTERUS     ESOPHAGOGASTRODUODENOSCOPY     ESOPHAGOGASTRODUODENOSCOPY (EGD) WITH PROPOFOL N/A 05/27/2019   normal esophagus, dilation, erosive gastropathy s/p biopsy, normal duodenum. Negative H.pylori.    LEFT HEART CATH AND CORONARY ANGIOGRAPHY N/A 03/26/2017   Procedure: LEFT HEART CATH AND CORONARY ANGIOGRAPHY;  Surgeon: Belva Crome, MD;  Location: [facility];  Service: Cardiovascular;  Laterality: N/A;   LUMBAR LAMINECTOMY/DECOMPRESSION MICRODISCECTOMY  08/05/2011   Procedure: LUMBAR LAMINECTOMY/DECOMPRESSION  MICRODISCECTOMY;  Surgeon: Otilio Connors, MD;  Location: [place];  Service: Neurosurgery;  Laterality: Right;  Right Lumbar four-five extraforaminal discectomy   MALONEY DILATION N/A 05/27/2019   Procedure: Venia Minks DILATION;  Surgeon: Daneil Dolin, MD;  Location: [facility];  Service: Endoscopy;  Laterality: N/A;  54   SHOULDER ARTHROSCOPY WITH ROTATOR CUFF REPAIR AND OPEN BICEPS TENODESIS Right 12/24/2021   Procedure: RIGHT SHOULDER ARTHROSCOPY WITH ROTATOR CUFF REPAIR AND BICEPS TENODESIS;  Surgeon: Marybelle Killings, MD;  Location: [facility];  Service: Orthopedics;  Laterality: Right;   TONSILLECTOMY     as a child   UPPER GASTROINTESTINAL ENDOSCOPY     Patient Active Problem List   Diagnosis Date Noted   Nontraumatic complete tear of right rotator cuff    Superior glenoid labrum lesion of left shoulder    Iron deficiency anemia 11/02/2021   Major depressive disorder,  recurrent, in full remission with anxious distress ([place]) 05/31/2021   Rotator cuff tendinitis, right 05/08/2021   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   Chronic neck pain 02/26/2021   GAD (generalized anxiety disorder) 01/08/2021   Acute cystitis without hematuria 11/07/2020   Influenza vaccine needed 06/15/2020   Abdominal pain, epigastric 04/28/2019   Esophageal dysphagia 04/28/2019   Bipolar I disorder ([place]) 09/17/2018   Insomnia 10/28/2017   Prediabetes 05/28/2017   QT prolongation 03/25/2017   Encounter for screening mammogram for breast cancer 09/11/2015   Psoriasis 06/22/2015   COPD (chronic obstructive pulmonary disease) (HCC)GOLD E 05/18/2015   Anxiety and depression 07/10/2013   Essential hypertension, benign 06/07/2013   Dyslipidemia 06/07/2013   Asthma, chronic 06/07/2013   Emphysema lung ([place]) 06/07/2013   Chronic back pain 06/07/2013   CAD S/P percutaneous coronary angioplasty 06/07/2013   GERD (gastroesophageal reflux disease) 06/07/2013   Breast lump on left side at 3 o'clock position 10/13/2012   S/P abdominal hysterectomy and right salpingo-oophorectomy 08/29/2011   TOBACCO ABUSE 07/17/2009   Chronic rhinitis 07/17/2009   Lung nodule < 6cm on CT 07/17/2009   ALLERGY, FOOD 07/17/2009   Hypothyroidism 12/22/2008     THERAPY DIAG:  Chronic right shoulder pain  Muscle weakness (generalized)  Stiffness of right shoulder, not elsewhere classified  Localized edema  PCP: Troy Sine MD   REFERRING PROVIDER: Marybelle Killings, MD   REFERRING DIAG: M75.81 (ICD-10-CM) - Rotator cuff tendinitis, right   Rationale for Evaluation and Treatment Rehabilitation   ONSET DATE: Surgery 12/24/2021   SUBJECTIVE:  SUBJECTIVE STATEMENT: She has increased pain today since waking  up from scar down to muscle, and took 2 Aleve prior to PT appointment without much relief. She has some neck pain and thinks she slept wrong or was tossing and turning. She intends to coordinate with her daughter to purchase a pulley for home.  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    OBJECTIVE:    PATIENT SURVEYS:   12/26/2021: FOTO intake:  20  predicted:  52   POSTURE: 12/26/2021 Rounded shoulder, forward head posture.  Rt arm in sling upon arrival              Localized edema Rt shoulder visibly    UPPER EXTREMITY ROM:    ROM Right 12/26/2021 Passive in supine - All limited by pain Right 01/02/2022  PROM - limited by pain response Right 01/09/2022  PROM supine PROM Right 01/11/2022 assessed supine 01/18/2022 PROM in supine 01/25/2022: PROM in supine 02/01/2022: AAROM  Shoulder flexion 10 100 in supine 125 in spuine  115 supine 125 supine 125 supine 145 supine  Shoulder extension          Shoulder abduction 5 90 in supine    99 in supine   Shoulder adduction          Shoulder internal rotation To belly With arm at side   55 degrees assessed at 70 degrees abduction     Shoulder external rotation -20 With arm at side 0 in 30 deg abduction  42 in 30 deg abduction supine 50 degrees assessed at 70 degrees abduction 53 degrees assessed at 70 degrees abduction 50 degrees assessed at 60 degrees abduction   Elbow flexion WFL passively        Elbow extension -20  deg c pain in shoulder passively        (Blank rows = not tested)   UPPER EXTREMITY MMT:                         12/26/2021:  not tested due to protocol for surgery - Rt shoulder MMT Right 02/08/2022   Left    Shoulder flexion  2+/5    Shoulder extension      Shoulder abduction      Shoulder adduction      Shoulder internal rotation      Shoulder external rotation      Middle trapezius      Lower trapezius      Elbow flexion      Elbow extension      Wrist flexion      Wrist extension      Wrist ulnar deviation      Wrist radial deviation      Wrist pronation      Wrist supination      Grip strength (lbs)      (Blank rows = not tested)   SHOULDER SPECIAL TESTS:  12/26/2021: none tested today   JOINT MOBILITY TESTING:  12/26/2021 heavy muscle guarding and pain limited full assessment for Rt shoulder.    PALPATION:  12/26/2021 Tenderness throughout Rt shoulder region in muscles and joint, incision sites.               TODAY'S TREATMENT:  02/08/2022 Therex:  Dwaine Deter fwd/back 3 mins each way lvl 1.3 Pulley flexion 3 mins, scaption 3 mins c arm close to body c movement to tolerance Supine AAROM flexion x 15 with 2-3 second holds 1 lb bar Supine protraction hold 5 sec bilateral x 10 with 1lb bar Supine AROM flexion 2 x 15   Sidelying shoulder abduction AROM 2 x 10  Rt Sidelying shoulder 90* abduction stabilization exercise 2 x 30sec  Standing ER isometric press into wall 10 sec hold x 6 reps, verbal cues to keep arm positioning neutral and chest forward  Modalities:  Vasopnuematic device X 10 min to Rt shoulder, 34 deg, Low compression  02/06/2022 Therex:  UBE AAROM fwd/back 3 mins each way lvl 2.0 Pulley flexion 3 mins, scaption 3 mins c arm close to body c movement to tolerance  Supine AAROM flexion x 15 with 2-3 second holds 1 lb bar Supine AROM flexion 2 x 15  Supine protraction hold 5 sec bilateral x 10  Sidelying shoulder abduction AROM 2 x 10   Rt Sidelying shoulder ER AROM 2 x 10 Rt  Standing rows green 2x10   02/01/2022 Manual therapy:  Rt shoulder PROM gentle to tolerance in flexion, ER, ABD.  G1-G2 mid/end range mobilizations to Rt GH joint.     Therex:  UBE AAROM fwd/back 3 mins each way  Pulley flexion 3 mins, scaption 3 mins c arm close to body c movement to tolerance  Supine AAROM flexion 2 x 15 with 2-3 second holds 1 lb bar Supine AAROM protraction c 1 lb bar 5 sec hold x 10   Standing rows green 2x10 Standing UE ranger flexion x 10   Additional time spent in review of new HEP progressions c cues verbal and visually.    Modalities:  Vasopnuematic device X 10 min to Rt shoulder, 34 deg, Low compression  01/30/2022 Manual therapy:  Rt shoulder PROM gentle to tolerance in flexion, ER, ABD.  G1-G2 mid/end range mobilizations to Rt GH joint.     Therex:  Pulley flexion 3 mins, scaption 3 mins c arm close to body c movement to tolerance  Supine AAROM flexion 2 x 10 with 2-3 second holds 1 lb bar Supine AAROM ER in 45 deg abduction 5 sec hold x 10  Supine AAROM protraction c 1 lb bar 5 sec hold x 10   Additional time spent in review of new HEP progressions c cues verbal and visually.    Modalities:  Vasopnuematic device X 10 min to Rt shoulder, 34 deg, Low compression   PATIENT EDUCATION: 01/30/2022 Education details: HEP progression Person educated: Patient Education method: Consulting civil engineer, Media planner, Verbal cues, and Handouts Education comprehension: verbalized understanding, returned demonstration, and verbal cues required     HOME EXERCISE PROGRAM: Access Code: [email or web address] URL: [email or web address] Date: 01/30/2022 Prepared by: Scot Jun  Exercises - Seated Scapular Retraction  - 3-5 x daily - 7 x weekly - 1 sets - 10 reps - 3-5 hold - Seated Elbow Flexion and Extension AROM  - 2-3 x daily - 7 x weekly - 1 sets - 10 reps - Supine Shoulder Flexion Extension AAROM with Dowel  -  2-3  x daily - 7 x weekly - 1-2 sets - 10-15 reps - 3 hold - Supine Shoulder External Rotation with Dowel (Mirrored)  - 2-3 x daily - 7 x weekly - 1 sets - 10 reps - 5 hold - Supine Shoulder Protraction with Dowel  - 1-2 x daily - 7 x weekly - 1 sets - 10 reps - 5 hold - Seated Shoulder Flexion AAROM with Pulley Behind (Mirrored)  - 2-3 x daily - 7 x weekly - 1 sets - 3 mins hold - Seated Shoulder Scaption AAROM with Pulley at Side (Mirrored)  - 2-3 x daily - 7 x weekly - 1 sets - 3 mins hold   ASSESSMENT:   CLINICAL IMPRESSION: She reported increased pain today that was slightly relieved with UBE and pulley mobility emphasis. She continued to progress with AROM motions in gravity reduced and anti-gravity positioning, and HEP can be updated to include further active strengthening. She continues to benefit from skilled PT to address mobility and strength deficits for functional UE use.   OBJECTIVE IMPAIRMENTS decreased activity tolerance, decreased coordination, decreased endurance, decreased mobility, decreased ROM, decreased strength, hypomobility, increased edema, increased fascial restrictions, impaired perceived functional ability, impaired flexibility, impaired UE functional use, improper body mechanics, and pain.    ACTIVITY LIMITATIONS carrying, lifting, bed mobility, bathing, toileting, dressing, reach over head, hygiene/grooming, and locomotion level   PARTICIPATION LIMITATIONS: meal prep, cleaning, laundry, interpersonal relationship, community activity, occupation, and yard work   PERSONAL FACTORS  Chronic back pain, CKD, COPD, CAD, depression, DM, GERD  are also affecting patient's functional outcome.    REHAB POTENTIAL: Good   CLINICAL DECISION MAKING: Stable/uncomplicated   EVALUATION COMPLEXITY: Low     GOALS: Goals reviewed with patient? Yes   Short term PT Goals (target date for Short term goals are 3 weeks 01/16/2022) Patient will demonstrate independent use of home exercise  program to maintain progress from in clinic treatments. Goal status: MET 01/16/22   Long term PT goals (target dates for all long term goals are 10 weeks  03/06/2022 )   1. Patient will demonstrate/report pain at worst less than or equal to 2/10 to facilitate minimal limitation in daily activity secondary to pain symptoms. Goal status: on going - assessed 01/25/2022   2. Patient will demonstrate independent use of home exercise program to facilitate ability to maintain/progress functional gains from skilled physical therapy services. Goal status: on going - assessed 01/25/2022   3. Patient will demonstrate FOTO outcome > or = 52 % to indicate reduced disability due to condition. Goal status: on going - assessed 01/25/2022   4.  Patient will demonstrate Rt [place] joint mobility WFL to facilitate usual self care, dressing, reaching overhead at PLOF s limitation due to symptoms.  Goal status: on going - assessed 01/25/2022   5.  Patient will demonstrate Rt UE MMT 4/5 or greater throughout to facilitate usual lifting, carrying in functional activity to PLOF s limitation.   Goal status: on going - assessed 01/25/2022   6.  Patient will demonstrate ability to sleep s restriction due to symptom.  Goal status: on going - assessed 01/25/2022     PLAN: PT FREQUENCY:  2-3x/week , then 1-2x/week as able   PT DURATION: 10 weeks   PLANNED INTERVENTIONS: Therapeutic exercises, Therapeutic activity, Neuro Muscular re-education, Balance training, Gait training, Patient/Family education, Joint mobilization, Stair training, DME instructions, Dry Needling, Electrical stimulation, Cryotherapy, Moist heat, Taping, Ultrasound, Ionotophoresis 2m/ml Dexamethasone, and Manual therapy.  All included unless contraindicated   PLAN FOR NEXT SESSION: continue AAROM- AROM progression, avoiding shrug. Isometric strengthening, vaso to end  [employer], SPT 02/08/22, 12:05 PM  This entire session of physical therapy was  performed under the direct supervision of PT signing evaluation /treatment. PT reviewed note and agrees.  Scot Jun, PT, DPT, OCS, ATC 02/08/22  12:20 PM

## 2022-02-11 ENCOUNTER — Telehealth (HOSPITAL_COMMUNITY): Payer: Self-pay | Admitting: *Deleted

## 2022-02-11 ENCOUNTER — Other Ambulatory Visit (HOSPITAL_COMMUNITY): Payer: Self-pay | Admitting: Student in an Organized Health Care Education/Training Program

## 2022-02-11 ENCOUNTER — Other Ambulatory Visit: Payer: Self-pay

## 2022-02-11 NOTE — Telephone Encounter (Signed)
[facility] Refill Request -- clonazePAM (KLONOPIN) 0.5 MG tablet Take 1 tablet (0.5 mg total) by mouth 2 (two) times daily as needed for anxiety

## 2022-02-11 NOTE — Telephone Encounter (Signed)
Patient has 2 refills on the previous order in 11/29/2021.

## 2022-02-12 ENCOUNTER — Other Ambulatory Visit: Payer: Self-pay

## 2022-02-12 ENCOUNTER — Encounter: Payer: Self-pay | Admitting: Hematology

## 2022-02-12 DIAGNOSIS — D500 Iron deficiency anemia secondary to blood loss (chronic): Secondary | ICD-10-CM

## 2022-02-13 ENCOUNTER — Encounter: Payer: Self-pay | Admitting: Rehabilitative and Restorative Service Providers"

## 2022-02-13 ENCOUNTER — Encounter: Payer: Self-pay | Admitting: Hematology

## 2022-02-13 ENCOUNTER — Ambulatory Visit (INDEPENDENT_AMBULATORY_CARE_PROVIDER_SITE_OTHER): Payer: Medicare Other | Admitting: Rehabilitative and Restorative Service Providers"

## 2022-02-13 ENCOUNTER — Other Ambulatory Visit: Payer: Self-pay

## 2022-02-13 ENCOUNTER — Other Ambulatory Visit (HOSPITAL_COMMUNITY): Payer: Self-pay | Admitting: Student in an Organized Health Care Education/Training Program

## 2022-02-13 DIAGNOSIS — M25511 Pain in right shoulder: Secondary | ICD-10-CM

## 2022-02-13 DIAGNOSIS — G8929 Other chronic pain: Secondary | ICD-10-CM

## 2022-02-13 DIAGNOSIS — M25611 Stiffness of right shoulder, not elsewhere classified: Secondary | ICD-10-CM

## 2022-02-13 DIAGNOSIS — R600 Localized edema: Secondary | ICD-10-CM

## 2022-02-13 DIAGNOSIS — F411 Generalized anxiety disorder: Secondary | ICD-10-CM

## 2022-02-13 DIAGNOSIS — M6281 Muscle weakness (generalized): Secondary | ICD-10-CM | POA: Diagnosis not present

## 2022-02-13 MED ORDER — CLONAZEPAM 0.5 MG PO TABS: 0.5000 mg | ORAL_TABLET | Freq: Two times a day (BID) | ORAL | 0 refills | Status: DC | PRN

## 2022-02-13 NOTE — Progress Notes (Signed)
1 time refill for klonopin 0.'5mg'$  BID PRN.   PGY-3  Damita Dunnings, MD

## 2022-02-13 NOTE — Therapy (Signed)
OUTPATIENT PHYSICAL THERAPY TREATMENT NOTE    Patient Name: [name] [name] MRN: [id number] DOB:[birth year], 66 y.o., female Today's Date: 02/13/2022   END OF SESSION:   PT End of Session - 02/13/22 1059     Visit Number 15    Number of Visits 20    Date for PT Re-Evaluation 03/06/22    Authorization Type Medicare    Progress Note Due on Visit 20    PT Start Time 1054    PT Stop Time 1135    PT Time Calculation (min) 41 min    Activity Tolerance Patient tolerated treatment well   pain in mobility   Behavior During Therapy WFL for tasks assessed/performed              Past Medical History:  Diagnosis Date   Allergy    seasonal   Anemia    Anxiety    takes Lexapro daily   Arthritis    "back, from neck down pass my bra area" (03/25/2017)   Asthma    Bartholin gland cyst 08/29/2011   Bruises easily    pt is on Effient   Chronic back pain    herniated nucleus pulposus   Chronic back pain    "neck to bra area; lower back" (03/25/2017)   Chronic kidney disease    recurrent UTI's this year 2022   COPD (chronic obstructive pulmonary disease) (HCC)    early stages   Coronary artery disease    Depression    takes Klonopin daily   Diabetes mellitus without complication ([place])    Diverticulosis    Fibroadenoma of left breast    GERD (gastroesophageal reflux disease)    takes Nexium daily   H/O hiatal hernia    Heart attack ([place]) 2011   Hemorrhoids    Hernia    Hyperlipidemia    takes Lipitor daily   Hypertension    takes Losartan daily and Labetalol bid   Hypothyroidism    takes Synthroid daily   Insomnia    hydroxyzine prn   Joint pain    Pneumonia    "couple times" (03/25/2017)   Pre-diabetes    "just found out 1 wk ago" (03/25/2017)   Psoriasis    elbows,knees,back   Shortness of breath    with exertion   Slowing of urinary stream    Stress incontinence    Past Surgical History:  Procedure Laterality Date   [place]   "left one of my ovaries"   BACK SURGERY     BIOPSY  05/27/2019   Procedure: BIOPSY;  Surgeon: Daneil Dolin, MD;  [facility]: [facility];  Service: Endoscopy;;   COLONOSCOPY  2011   Dr. Ardis Hughs: Mild diverticulosis, descending diminutive colon polyp (not retrieved), next colonoscopy 10 years   CORONARY ANGIOPLASTY WITH STENT PLACEMENT  2011 X2   "regular stents didn't work; had to go back in in ~ 1 month and put in medicated stents"   DILATION AND CURETTAGE OF UTERUS     ESOPHAGOGASTRODUODENOSCOPY     ESOPHAGOGASTRODUODENOSCOPY (EGD) WITH PROPOFOL N/A 05/27/2019   normal esophagus, dilation, erosive gastropathy s/p biopsy, normal duodenum. Negative H.pylori.    LEFT HEART CATH AND CORONARY ANGIOGRAPHY N/A 03/26/2017   Procedure: LEFT HEART CATH AND CORONARY ANGIOGRAPHY;  Surgeon: Belva Crome, MD;  Location: [facility];  Service: Cardiovascular;  Laterality: N/A;   LUMBAR LAMINECTOMY/DECOMPRESSION MICRODISCECTOMY  08/05/2011   Procedure: LUMBAR  LAMINECTOMY/DECOMPRESSION MICRODISCECTOMY;  Surgeon: Otilio Connors, MD;  Location: [facility];  Service: Neurosurgery;  Laterality: Right;  Right Lumbar four-five extraforaminal discectomy   MALONEY DILATION N/A 05/27/2019   Procedure: Venia Minks DILATION;  Surgeon: Daneil Dolin, MD;  Location: [facility];  Service: Endoscopy;  Laterality: N/A;  54   SHOULDER ARTHROSCOPY WITH ROTATOR CUFF REPAIR AND OPEN BICEPS TENODESIS Right 12/24/2021   Procedure: RIGHT SHOULDER ARTHROSCOPY WITH ROTATOR CUFF REPAIR AND BICEPS TENODESIS;  Surgeon: Marybelle Killings, MD;  Location: [facility];  Service: Orthopedics;  Laterality: Right;   TONSILLECTOMY     as a child   UPPER GASTROINTESTINAL ENDOSCOPY     Patient Active Problem List   Diagnosis Date Noted   Nontraumatic complete tear of right rotator cuff    Superior glenoid labrum lesion of left shoulder    Iron deficiency anemia 11/02/2021   Major depressive disorder,  recurrent, in full remission with anxious distress ([place]) 05/31/2021   Rotator cuff tendinitis, right 05/08/2021   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   Chronic neck pain 02/26/2021   GAD (generalized anxiety disorder) 01/08/2021   Acute cystitis without hematuria 11/07/2020   Influenza vaccine needed 06/15/2020   Abdominal pain, epigastric 04/28/2019   Esophageal dysphagia 04/28/2019   Bipolar I disorder ([place]) 09/17/2018   Insomnia 10/28/2017   Prediabetes 05/28/2017   QT prolongation 03/25/2017   Encounter for screening mammogram for breast cancer 09/11/2015   Psoriasis 06/22/2015   COPD (chronic obstructive pulmonary disease) (HCC)GOLD E 05/18/2015   Anxiety and depression 07/10/2013   Essential hypertension, benign 06/07/2013   Dyslipidemia 06/07/2013   Asthma, chronic 06/07/2013   Emphysema lung ([place]) 06/07/2013   Chronic back pain 06/07/2013   CAD S/P percutaneous coronary angioplasty 06/07/2013   GERD (gastroesophageal reflux disease) 06/07/2013   Breast lump on left side at 3 o'clock position 10/13/2012   S/P abdominal hysterectomy and right salpingo-oophorectomy 08/29/2011   TOBACCO ABUSE 07/17/2009   Chronic rhinitis 07/17/2009   Lung nodule < 6cm on CT 07/17/2009   ALLERGY, FOOD 07/17/2009   Hypothyroidism 12/22/2008     THERAPY DIAG:  Chronic right shoulder pain  Muscle weakness (generalized)  Stiffness of right shoulder, not elsewhere classified  Localized edema  PCP: Troy Sine MD   REFERRING PROVIDER: Marybelle Killings, MD   REFERRING DIAG: M75.81 (ICD-10-CM) - Rotator cuff tendinitis, right   Rationale for Evaluation and Treatment Rehabilitation   ONSET DATE: Surgery 12/24/2021   SUBJECTIVE:  SUBJECTIVE STATEMENT: Pt indicated feeling better today.   Reported no pain upon arrival at rest.  Reported getting pulleys for home.  Reported Friday night was tough but got better from then on.   PERTINENT HISTORY: Chronic back pain, CKD, COPD, CAD, depression, DM, GERD   PAIN:  NPRS scale: 0/10 upon arrival.   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    OBJECTIVE:    PATIENT SURVEYS:  02/13/2022:  FOTO update:   48  12/26/2021: FOTO intake:  20  predicted:  52   POSTURE: 12/26/2021 Rounded shoulder, forward head posture.  Rt arm in sling upon arrival              Localized edema Rt shoulder visibly    UPPER EXTREMITY ROM:    ROM Right 12/26/2021 Passive in supine - All limited by pain Right 01/02/2022  PROM - limited by pain response Right 01/09/2022  PROM supine PROM Right 01/11/2022 assessed supine 01/18/2022 PROM in supine 01/25/2022: PROM in supine 02/01/2022: AAROM  Shoulder flexion 10 100 in supine 125 in spuine  115 supine 125 supine 125 supine 145 supine  Shoulder extension          Shoulder abduction 5 90 in supine    99 in supine   Shoulder adduction          Shoulder internal rotation To belly With arm at side   55 degrees assessed at 70 degrees abduction     Shoulder external rotation -20 With arm at side 0 in 30 deg abduction  42 in 30 deg abduction supine 50 degrees assessed at 70 degrees abduction 53 degrees assessed at 70 degrees abduction 50 degrees assessed at 60 degrees abduction   Elbow flexion WFL passively        Elbow extension -20 deg c pain in shoulder passively        (Blank rows = not tested)    UPPER EXTREMITY MMT:                         12/26/2021:  not tested due to protocol for surgery - Rt shoulder MMT Right 02/08/2022   Left    Shoulder flexion  2+/5    Shoulder extension      Shoulder abduction      Shoulder adduction      Shoulder internal rotation      Shoulder external rotation      Middle trapezius      Lower trapezius      Elbow flexion      Elbow extension      Wrist flexion      Wrist extension      Wrist ulnar deviation      Wrist radial deviation      Wrist pronation      Wrist supination      Grip strength (lbs)      (Blank rows = not tested)   SHOULDER SPECIAL TESTS:             12/26/2021: none tested today   JOINT MOBILITY TESTING:  12/26/2021  heavy muscle guarding and pain limited full assessment for Rt shoulder.    PALPATION:  12/26/2021 Tenderness throughout Rt shoulder region in muscles and joint, incision sites.               TODAY'S TREATMENT:  02/13/2022 Therex:  Dwaine Deter fwd/back 3 mins each way lvl 2.5 Pulley flexion 3 mins, scaption 3 mins c long lever arm  Standing UE ranger AAROM flexion c ipsilateral leg stepping ( to mimic reaching into cabinet) x 15  Supine AAROM flexion x 15 with 2-3 second holds 2 lb bar Supine protraction hold 3 seconds in 110 deg flexion x 10 Supine AROM flexion 1 lb 2 x 10   Seated Scapular retraction c bilateral ER x 10   Standing ER isometric press into wall 10 sec hold x 6 reps, verbal cues to keep arm positioning neutral and chest forward Standing low row isometric into counter c arm at side 10 sec x 6   Modalities:  Vasopnuematic device X 10 min to Rt shoulder, 34 deg, Low compression  02/08/2022 Therex:  UBE AAROM fwd/back 3 mins each way lvl 1.3 Pulley flexion 3 mins, scaption 3 mins c arm close to body c movement to tolerance Supine AAROM flexion x 15 with 2-3 second holds 1 lb bar Supine protraction hold 5 sec bilateral x 10 with 1lb bar Supine AROM flexion 2 x 15   Sidelying shoulder  abduction AROM 2 x 10  Rt Sidelying shoulder 90* abduction stabilization exercise 2 x 30sec  Standing ER isometric press into wall 10 sec hold x 6 reps, verbal cues to keep arm positioning neutral and chest forward  Modalities:  Vasopnuematic device X 10 min to Rt shoulder, 34 deg, Low compression  02/06/2022 Therex:  UBE AAROM fwd/back 3 mins each way lvl 2.0 Pulley flexion 3 mins, scaption 3 mins c arm close to body c movement to tolerance  Supine AAROM flexion x 15 with 2-3 second holds 1 lb bar Supine AROM flexion 2 x 15  Supine protraction hold 5 sec bilateral x 10  Sidelying shoulder abduction AROM 2 x 10  Rt Sidelying shoulder ER AROM 2 x 10 Rt  Standing rows green 2x10    PATIENT EDUCATION: 02/13/2022 Education details: HEP progression Person educated: Patient Education method: Consulting civil engineer, Media planner, Verbal cues, and Handouts Education comprehension: verbalized understanding, returned demonstration, and verbal cues required     HOME EXERCISE PROGRAM: Access Code: [email or web address] URL: [email or web address] Date: 02/13/2022 Prepared by: Scot Jun  Exercises - Seated Scapular Retraction  - 3-5 x daily - 7 x weekly - 1 sets - 10 reps - 3-5 hold - Seated Elbow Flexion and Extension AROM  - 2-3 x daily - 7 x weekly - 1 sets - 10 reps - Supine Shoulder Flexion Extension AAROM with Dowel  - 2-3 x daily - 7 x weekly - 1-2 sets - 10-15 reps - 3 hold - Supine Shoulder External Rotation with Dowel (Mirrored)  - 2-3 x daily - 7 x weekly - 1 sets - 10 reps - 5 hold - Supine Shoulder Flexion Extension Full Range AROM  - 1-2 x daily - 7 x weekly - 2-3 sets - 10-15 reps - Standing Isometric Shoulder External Rotation with Doorway (Mirrored)  - 2 x daily - 7 x weekly - 1 sets - 5-10 reps - 10 hold - Standing Isometric Shoulder Extension with Doorway (Mirrored)  - 1 x daily - 7 x weekly - 1 sets - 5-10 reps -  10 hold - Sidelying Shoulder Abduction Palm Forward  - 1-2 x  daily - 7 x weekly - 2-3 sets - 10-15 reps   ASSESSMENT:   CLINICAL IMPRESSION: Continued focus on improvement of active mobility control and endurance to contribute to improve Rt arm use in functional reaching and mobility in daily life.  Making slow but steady overall gains in ability at this time.  Continued skilled PT services for [facility] strength and scapular control improvements warranted to improve function.    OBJECTIVE IMPAIRMENTS decreased activity tolerance, decreased coordination, decreased endurance, decreased mobility, decreased ROM, decreased strength, hypomobility, increased edema, increased fascial restrictions, impaired perceived functional ability, impaired flexibility, impaired UE functional use, improper body mechanics, and pain.    ACTIVITY LIMITATIONS carrying, lifting, bed mobility, bathing, toileting, dressing, reach over head, hygiene/grooming, and locomotion level   PARTICIPATION LIMITATIONS: meal prep, cleaning, laundry, interpersonal relationship, community activity, occupation, and yard work   PERSONAL FACTORS  Chronic back pain, CKD, COPD, CAD, depression, DM, GERD  are also affecting patient's functional outcome.    REHAB POTENTIAL: Good   CLINICAL DECISION MAKING: Stable/uncomplicated   EVALUATION COMPLEXITY: Low     GOALS: Goals reviewed with patient? Yes   Short term PT Goals (target date for Short term goals are 3 weeks 01/16/2022) Patient will demonstrate independent use of home exercise program to maintain progress from in clinic treatments. Goal status: MET 01/16/22   Long term PT goals (target dates for all long term goals are 10 weeks  03/06/2022 )   1. Patient will demonstrate/report pain at worst less than or equal to 2/10 to facilitate minimal limitation in daily activity secondary to pain symptoms. Goal status: on going - assessed 01/25/2022   2. Patient will demonstrate independent use of home exercise program to facilitate ability to  maintain/progress functional gains from skilled physical therapy services. Goal status: on going - assessed 01/25/2022   3. Patient will demonstrate FOTO outcome > or = 52 % to indicate reduced disability due to condition. Goal status: on going - assessed 01/25/2022   4.  Patient will demonstrate Rt McGuffey joint mobility WFL to facilitate usual self care, dressing, reaching overhead at PLOF s limitation due to symptoms.  Goal status: on going - assessed 01/25/2022   5.  Patient will demonstrate Rt UE MMT 4/5 or greater throughout to facilitate usual lifting, carrying in functional activity to PLOF s limitation.   Goal status: on going - assessed 01/25/2022   6.  Patient will demonstrate ability to sleep s restriction due to symptom.  Goal status: on going - assessed 01/25/2022     PLAN: PT FREQUENCY:  2-3x/week , then 1-2x/week as able   PT DURATION: 10 weeks   PLANNED INTERVENTIONS: Therapeutic exercises, Therapeutic activity, Neuro Muscular re-education, Balance training, Gait training, Patient/Family education, Joint mobilization, Stair training, DME instructions, Dry Needling, Electrical stimulation, Cryotherapy, Moist heat, Taping, Ultrasound, Ionotophoresis 23m/ml Dexamethasone, and Manual therapy.  All included unless contraindicated   PLAN FOR NEXT SESSION: continue AAROM- AROM progression, avoiding shrug. Isometric strengthening, vaso prn  MScot Jun PT, DPT, OCS, ATC 02/13/22  11:42 AM

## 2022-02-15 ENCOUNTER — Encounter: Payer: Self-pay | Admitting: Rehabilitative and Restorative Service Providers"

## 2022-02-15 ENCOUNTER — Ambulatory Visit (INDEPENDENT_AMBULATORY_CARE_PROVIDER_SITE_OTHER): Payer: Medicare Other | Admitting: Rehabilitative and Restorative Service Providers"

## 2022-02-15 ENCOUNTER — Other Ambulatory Visit: Payer: Self-pay

## 2022-02-15 DIAGNOSIS — M6281 Muscle weakness (generalized): Secondary | ICD-10-CM | POA: Diagnosis not present

## 2022-02-15 DIAGNOSIS — M25511 Pain in right shoulder: Secondary | ICD-10-CM | POA: Diagnosis not present

## 2022-02-15 DIAGNOSIS — G8929 Other chronic pain: Secondary | ICD-10-CM

## 2022-02-15 DIAGNOSIS — M25611 Stiffness of right shoulder, not elsewhere classified: Secondary | ICD-10-CM | POA: Diagnosis not present

## 2022-02-15 DIAGNOSIS — R600 Localized edema: Secondary | ICD-10-CM | POA: Diagnosis not present

## 2022-02-15 NOTE — Therapy (Signed)
OUTPATIENT PHYSICAL THERAPY TREATMENT NOTE    Patient Name: [name] [name] MRN: [id number] DOB:September 21, 1955, 66 y.o., female Today's Date: 02/15/2022   END OF SESSION:   PT End of Session - 02/15/22 1110     Visit Number 16    Number of Visits 20    Date for PT Re-Evaluation 03/06/22    Authorization Type Medicare    Progress Note Due on Visit 20    PT Start Time 1058    PT Stop Time 1144    PT Time Calculation (min) 46 min    Activity Tolerance Patient tolerated treatment well   pain in mobility   Behavior During Therapy WFL for tasks assessed/performed               Past Medical History:  Diagnosis Date   Allergy    seasonal   Anemia    Anxiety    takes Lexapro daily   Arthritis    "back, from neck down pass my bra area" (03/25/2017)   Asthma    Bartholin gland cyst 08/29/2011   Bruises easily    pt is on Effient   Chronic back pain    herniated nucleus pulposus   Chronic back pain    "neck to bra area; lower back" (03/25/2017)   Chronic kidney disease    recurrent UTI's this year 2022   COPD (chronic obstructive pulmonary disease) (HCC)    early stages   Coronary artery disease    Depression    takes Klonopin daily   Diabetes mellitus without complication (HCC)    Diverticulosis    Fibroadenoma of left breast    GERD (gastroesophageal reflux disease)    takes Nexium daily   H/O hiatal hernia    Heart attack ([place]) 2011   Hemorrhoids    Hernia    Hyperlipidemia    takes Lipitor daily   Hypertension    takes Losartan daily and Labetalol bid   Hypothyroidism    takes Synthroid daily   Insomnia    hydroxyzine prn   Joint pain    Pneumonia    "couple times" (03/25/2017)   Pre-diabetes    "just found out 1 wk ago" (03/25/2017)   Psoriasis    elbows,knees,back   Shortness of breath    with exertion   Slowing of urinary stream    Stress incontinence    Past Surgical History:  Procedure Laterality Date   [place]   "left one of my ovaries"   BACK SURGERY     BIOPSY  05/27/2019   Procedure: BIOPSY;  Surgeon: Daneil Dolin, MD;  Location: [facility];  Service: Endoscopy;;   COLONOSCOPY  2011   Dr. Ardis Hughs: Mild diverticulosis, descending diminutive colon polyp (not retrieved), next colonoscopy 10 years   CORONARY ANGIOPLASTY WITH STENT PLACEMENT  2011 X2   "regular stents didn't work; had to go back in in ~ 1 month and put in medicated stents"   DILATION AND CURETTAGE OF UTERUS     ESOPHAGOGASTRODUODENOSCOPY     ESOPHAGOGASTRODUODENOSCOPY (EGD) WITH PROPOFOL N/A 05/27/2019   normal esophagus, dilation, erosive gastropathy s/p biopsy, normal duodenum. Negative H.pylori.    LEFT HEART CATH AND CORONARY ANGIOGRAPHY N/A 03/26/2017   Procedure: LEFT HEART CATH AND CORONARY ANGIOGRAPHY;  Surgeon: Belva Crome, MD;  Location: [facility];  Service: Cardiovascular;  Laterality: N/A;   LUMBAR LAMINECTOMY/DECOMPRESSION MICRODISCECTOMY  08/05/2011   Procedure:  LUMBAR LAMINECTOMY/DECOMPRESSION MICRODISCECTOMY;  Surgeon: Otilio Connors, MD;  Location: [facility];  Service: Neurosurgery;  Laterality: Right;  Right Lumbar four-five extraforaminal discectomy   MALONEY DILATION N/A 05/27/2019   Procedure: Venia Minks DILATION;  Surgeon: Daneil Dolin, MD;  Location: [facility];  Service: Endoscopy;  Laterality: N/A;  54   SHOULDER ARTHROSCOPY WITH ROTATOR CUFF REPAIR AND OPEN BICEPS TENODESIS Right 12/24/2021   Procedure: RIGHT SHOULDER ARTHROSCOPY WITH ROTATOR CUFF REPAIR AND BICEPS TENODESIS;  Surgeon: Marybelle Killings, MD;  Location: [facility];  Service: Orthopedics;  Laterality: Right;   TONSILLECTOMY     as a child   UPPER GASTROINTESTINAL ENDOSCOPY     Patient Active Problem List   Diagnosis Date Noted   Nontraumatic complete tear of right rotator cuff    Superior glenoid labrum lesion of left shoulder    Iron deficiency anemia 11/02/2021   Major depressive disorder,  recurrent, in full remission with anxious distress ([place]) 05/31/2021   Rotator cuff tendinitis, right 05/08/2021   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   Chronic neck pain 02/26/2021   GAD (generalized anxiety disorder) 01/08/2021   Acute cystitis without hematuria 11/07/2020   Influenza vaccine needed 06/15/2020   Abdominal pain, epigastric 04/28/2019   Esophageal dysphagia 04/28/2019   Bipolar I disorder ([place]) 09/17/2018   Insomnia 10/28/2017   Prediabetes 05/28/2017   QT prolongation 03/25/2017   Encounter for screening mammogram for breast cancer 09/11/2015   Psoriasis 06/22/2015   COPD (chronic obstructive pulmonary disease) (HCC)GOLD E 05/18/2015   Anxiety and depression 07/10/2013   Essential hypertension, benign 06/07/2013   Dyslipidemia 06/07/2013   Asthma, chronic 06/07/2013   Emphysema lung ([place]) 06/07/2013   Chronic back pain 06/07/2013   CAD S/P percutaneous coronary angioplasty 06/07/2013   GERD (gastroesophageal reflux disease) 06/07/2013   Breast lump on left side at 3 o'clock position 10/13/2012   S/P abdominal hysterectomy and right salpingo-oophorectomy 08/29/2011   TOBACCO ABUSE 07/17/2009   Chronic rhinitis 07/17/2009   Lung nodule < 6cm on CT 07/17/2009   ALLERGY, FOOD 07/17/2009   Hypothyroidism 12/22/2008     THERAPY DIAG:  Chronic right shoulder pain  Muscle weakness (generalized)  Stiffness of right shoulder, not elsewhere classified  Localized edema  PCP: Troy Sine MD   REFERRING PROVIDER: Marybelle Killings, MD   REFERRING DIAG: M75.81 (ICD-10-CM) - Rotator cuff tendinitis, right   Rationale for Evaluation and Treatment Rehabilitation   ONSET DATE: Surgery 12/24/2021   SUBJECTIVE:  SUBJECTIVE STATEMENT: Pt indicated feeling a little more  sore/achy today.  Reported she got her pulley and has been doing it.  Pt indicated she is going home by her self (has been living with daughter).  PERTINENT HISTORY: Chronic back pain, CKD, COPD, CAD, depression, DM, GERD   PAIN:  NPRS scale: 6/10 upon arrival.   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    OBJECTIVE:    PATIENT SURVEYS:  02/13/2022:  FOTO update:   48  12/26/2021: FOTO intake:  20  predicted:  52   POSTURE: 12/26/2021 Rounded shoulder, forward head posture.  Rt arm in sling upon arrival              Localized edema Rt shoulder visibly    UPPER EXTREMITY ROM:    ROM Right 12/26/2021 Passive in supine - All limited by pain Right 01/02/2022  PROM - limited by pain response Right 01/09/2022  PROM supine PROM Right 01/11/2022 assessed supine 01/18/2022 PROM in supine 01/25/2022: PROM in supine 02/01/2022: AAROM  Shoulder flexion 10 100 in supine 125 in spuine  115 supine 125 supine 125 supine 145 supine  Shoulder extension          Shoulder abduction 5 90 in supine    99 in supine   Shoulder adduction          Shoulder internal rotation To belly With arm at side   55 degrees assessed at 70 degrees abduction     Shoulder external rotation -20 With arm at side 0 in 30 deg abduction  42 in 30 deg abduction supine 50 degrees assessed at 70 degrees abduction 53 degrees assessed at 70 degrees abduction 50 degrees assessed at 60 degrees abduction   Elbow flexion WFL passively        Elbow extension  -20 deg c pain in shoulder passively        (Blank rows = not tested)   UPPER EXTREMITY MMT:                         12/26/2021:  not tested due to protocol for surgery - Rt shoulder MMT Right 02/08/2022   Left    Shoulder flexion  2+/5    Shoulder extension      Shoulder abduction      Shoulder adduction      Shoulder internal rotation      Shoulder external rotation      Middle trapezius      Lower trapezius      Elbow flexion      Elbow extension      Wrist flexion      Wrist extension      Wrist ulnar deviation      Wrist radial deviation      Wrist pronation      Wrist supination      Grip strength (lbs)      (Blank rows = not tested)   SHOULDER SPECIAL TESTS:  12/26/2021: none tested today   JOINT MOBILITY TESTING:  12/26/2021 heavy muscle guarding and pain limited full assessment for Rt shoulder.    PALPATION:  12/26/2021 Tenderness throughout Rt shoulder region in muscles and joint, incision sites.               TODAY'S TREATMENT:  02/15/2022 Therex:  Dwaine Deter fwd/back 3 mins each way lvl 2.5  Supine AAROM flexion x 15 with 2-3 second holds 2 lb bar Supine protraction hold 3 seconds bil. in 90 deg flexion 2lb bar x 10 Supine protraction hold 3 seconds in 110 deg flexion x 10 with visual reaching target Supine AROM flexion 1 lb 2 x 10   Sidelying shoulder abduction 1 lb 2 x 10, cueing for positioning  Seated Scapular retraction c bilateral ER x 10, verbal cueing for elbow positioning Seated Scapular retraction c bilateral ER red band x 10, added to HEP  Standing ER isometric press into wall 10 sec hold x 6 reps Standing rows green 2x10 Standing UE ranger AAROM flexion c ipsilateral leg stepping (to mimic reaching into cabinet) x 15  Manual:   supine g3 inferior jt mobs in flexion, scaption.   Active contract /relax c compression to Rt lat for elevation gains  Modalities:  Vasopnuematic device X 10 min to Rt shoulder, 34 deg, Low  compression   02/13/2022 Therex:  UBE AAROM fwd/back 3 mins each way lvl 2.5 Pulley flexion 3 mins, scaption 3 mins c long lever arm  Standing UE ranger AAROM flexion c ipsilateral leg stepping ( to mimic reaching into cabinet) x 15  Supine AAROM flexion x 15 with 2-3 second holds 2 lb bar Supine protraction hold 3 seconds in 110 deg flexion x 10 Supine AROM flexion 1 lb 2 x 10   Seated Scapular retraction c bilateral ER x 10   Standing ER isometric press into wall 10 sec hold x 6 reps, verbal cues to keep arm positioning neutral and chest forward Standing low row isometric into counter c arm at side 10 sec x 6   Modalities:  Vasopnuematic device X 10 min to Rt shoulder, 34 deg, Low compression  02/08/2022 Therex:  UBE AAROM fwd/back 3 mins each way lvl 1.3 Pulley flexion 3 mins, scaption 3 mins c arm close to body c movement to tolerance Supine AAROM flexion x 15 with 2-3 second holds 1 lb bar Supine protraction hold 5 sec bilateral x 10 with 1lb bar Supine AROM flexion 2 x 15   Sidelying shoulder abduction AROM 2 x 10  Rt Sidelying shoulder 90* abduction stabilization exercise 2 x 30sec  Standing ER isometric press into wall 10 sec hold x 6 reps, verbal cues to keep arm positioning neutral and chest forward  Modalities:  Vasopnuematic device X 10 min to Rt shoulder, 34 deg, Low compression   PATIENT EDUCATION: 02/13/2022 Education details: HEP progression Person educated: Patient Education method: Consulting civil engineer, Media planner, Verbal cues, and Handouts Education comprehension: verbalized understanding, returned demonstration, and verbal cues required     HOME EXERCISE PROGRAM: Access Code: [email or web address] URL: [email or web address] Date: 02/15/2022 Prepared by: Scot Jun  Exercises - Seated Scapular Retraction  - 3-5 x daily - 7 x weekly - 1 sets - 10 reps - 3-5 hold - Supine Shoulder Flexion Extension AAROM with Dowel  - 2-3 x daily - 7 x weekly - 1-2  sets - 10-15 reps - 3 hold - Supine Shoulder External Rotation with Dowel (Mirrored)  - 2-3 x daily -  7 x weekly - 1 sets - 10 reps - 5 hold - Supine Shoulder Flexion Extension Full Range AROM  - 1-2 x daily - 7 x weekly - 2-3 sets - 10-15 reps - Standing Isometric Shoulder External Rotation with Doorway (Mirrored)  - 2 x daily - 7 x weekly - 1 sets - 5-10 reps - 10 hold - Standing Isometric Shoulder Extension with Doorway (Mirrored)  - 1 x daily - 7 x weekly - 1 sets - 5-10 reps - 10 hold - Sidelying Shoulder Abduction Palm Forward  - 1-2 x daily - 7 x weekly - 2-3 sets - 10-15 reps - Shoulder External Rotation and Scapular Retraction with Resistance  - 3-5 x daily - 7 x weekly - 1-2 sets - 10 reps   ASSESSMENT:   CLINICAL IMPRESSION: She is continuing to improve with active motions anti-gravity and strength endurance. She is limited by stiffness that improves with mobility, and got a pulley for use at home. She continues to benefit from skilled PT to address [facility] and scapular strengthening and mobility to achieve functional goals.   OBJECTIVE IMPAIRMENTS decreased activity tolerance, decreased coordination, decreased endurance, decreased mobility, decreased ROM, decreased strength, hypomobility, increased edema, increased fascial restrictions, impaired perceived functional ability, impaired flexibility, impaired UE functional use, improper body mechanics, and pain.    ACTIVITY LIMITATIONS carrying, lifting, bed mobility, bathing, toileting, dressing, reach over head, hygiene/grooming, and locomotion level   PARTICIPATION LIMITATIONS: meal prep, cleaning, laundry, interpersonal relationship, community activity, occupation, and yard work   PERSONAL FACTORS  Chronic back pain, CKD, COPD, CAD, depression, DM, GERD  are also affecting patient's functional outcome.    REHAB POTENTIAL: Good   CLINICAL DECISION MAKING: Stable/uncomplicated   EVALUATION COMPLEXITY: Low     GOALS: Goals reviewed  with patient? Yes   Short term PT Goals (target date for Short term goals are 3 weeks 01/16/2022) Patient will demonstrate independent use of home exercise program to maintain progress from in clinic treatments. Goal status: MET 01/16/22   Long term PT goals (target dates for all long term goals are 10 weeks  03/06/2022 )   1. Patient will demonstrate/report pain at worst less than or equal to 2/10 to facilitate minimal limitation in daily activity secondary to pain symptoms. Goal status: on going - assessed 01/25/2022   2. Patient will demonstrate independent use of home exercise program to facilitate ability to maintain/progress functional gains from skilled physical therapy services. Goal status: on going - assessed 01/25/2022   3. Patient will demonstrate FOTO outcome > or = 52 % to indicate reduced disability due to condition. Goal status: on going - assessed 01/25/2022   4.  Patient will demonstrate Rt [place] joint mobility WFL to facilitate usual self care, dressing, reaching overhead at PLOF s limitation due to symptoms.  Goal status: on going - assessed 01/25/2022   5.  Patient will demonstrate Rt UE MMT 4/5 or greater throughout to facilitate usual lifting, carrying in functional activity to PLOF s limitation.   Goal status: on going - assessed 01/25/2022   6.  Patient will demonstrate ability to sleep s restriction due to symptom.  Goal status: on going - assessed 01/25/2022     PLAN: PT FREQUENCY:  2-3x/week , then 1-2x/week as able   PT DURATION: 10 weeks   PLANNED INTERVENTIONS: Therapeutic exercises, Therapeutic activity, Neuro Muscular re-education, Balance training, Gait training, Patient/Family education, Joint mobilization, Stair training, DME instructions, Dry Needling, Electrical stimulation, Cryotherapy, Moist heat, Taping,  Ultrasound, Ionotophoresis 55m/ml Dexamethasone, and Manual therapy.  All included unless contraindicated   PLAN FOR NEXT SESSION: continue AAROM- AROM  progression, avoiding shrug. Isometric and resistive strengthening, manual/modalities to improve range as indicated. vaso prn  KJana Hakim SPT 02/15/2022 11:55 AM  This entire session of physical therapy was performed under the direct supervision of PT signing evaluation /treatment. PT reviewed note and agrees.  MScot Jun PT, DPT, OCS, ATC 02/15/22  11:55 AM

## 2022-02-18 ENCOUNTER — Other Ambulatory Visit: Payer: Self-pay

## 2022-02-18 ENCOUNTER — Inpatient Hospital Stay: Payer: Medicare Other

## 2022-02-18 ENCOUNTER — Inpatient Hospital Stay: Payer: Medicare Other | Attending: Hematology | Admitting: Hematology

## 2022-02-18 VITALS — BP 133/77 | HR 60 | Temp 97.5°F | Resp 15 | Wt 143.9 lb

## 2022-02-18 DIAGNOSIS — Z7982 Long term (current) use of aspirin: Secondary | ICD-10-CM | POA: Insufficient documentation

## 2022-02-18 DIAGNOSIS — N189 Chronic kidney disease, unspecified: Secondary | ICD-10-CM | POA: Insufficient documentation

## 2022-02-18 DIAGNOSIS — E785 Hyperlipidemia, unspecified: Secondary | ICD-10-CM | POA: Insufficient documentation

## 2022-02-18 DIAGNOSIS — Z7984 Long term (current) use of oral hypoglycemic drugs: Secondary | ICD-10-CM | POA: Diagnosis not present

## 2022-02-18 DIAGNOSIS — F419 Anxiety disorder, unspecified: Secondary | ICD-10-CM | POA: Diagnosis not present

## 2022-02-18 DIAGNOSIS — E1122 Type 2 diabetes mellitus with diabetic chronic kidney disease: Secondary | ICD-10-CM | POA: Insufficient documentation

## 2022-02-18 DIAGNOSIS — I129 Hypertensive chronic kidney disease with stage 1 through stage 4 chronic kidney disease, or unspecified chronic kidney disease: Secondary | ICD-10-CM | POA: Diagnosis not present

## 2022-02-18 DIAGNOSIS — I252 Old myocardial infarction: Secondary | ICD-10-CM | POA: Insufficient documentation

## 2022-02-18 DIAGNOSIS — Z7902 Long term (current) use of antithrombotics/antiplatelets: Secondary | ICD-10-CM | POA: Insufficient documentation

## 2022-02-18 DIAGNOSIS — I2510 Atherosclerotic heart disease of native coronary artery without angina pectoris: Secondary | ICD-10-CM | POA: Insufficient documentation

## 2022-02-18 DIAGNOSIS — E039 Hypothyroidism, unspecified: Secondary | ICD-10-CM | POA: Insufficient documentation

## 2022-02-18 DIAGNOSIS — Z803 Family history of malignant neoplasm of breast: Secondary | ICD-10-CM | POA: Diagnosis not present

## 2022-02-18 DIAGNOSIS — D649 Anemia, unspecified: Secondary | ICD-10-CM | POA: Diagnosis not present

## 2022-02-18 DIAGNOSIS — K219 Gastro-esophageal reflux disease without esophagitis: Secondary | ICD-10-CM | POA: Diagnosis not present

## 2022-02-18 DIAGNOSIS — Z79890 Hormone replacement therapy (postmenopausal): Secondary | ICD-10-CM | POA: Diagnosis not present

## 2022-02-18 DIAGNOSIS — Z801 Family history of malignant neoplasm of trachea, bronchus and lung: Secondary | ICD-10-CM | POA: Insufficient documentation

## 2022-02-18 DIAGNOSIS — D75839 Thrombocytosis, unspecified: Secondary | ICD-10-CM | POA: Diagnosis not present

## 2022-02-18 DIAGNOSIS — D508 Other iron deficiency anemias: Secondary | ICD-10-CM

## 2022-02-18 DIAGNOSIS — Z808 Family history of malignant neoplasm of other organs or systems: Secondary | ICD-10-CM | POA: Insufficient documentation

## 2022-02-18 DIAGNOSIS — E119 Type 2 diabetes mellitus without complications: Secondary | ICD-10-CM | POA: Insufficient documentation

## 2022-02-18 DIAGNOSIS — J449 Chronic obstructive pulmonary disease, unspecified: Secondary | ICD-10-CM | POA: Diagnosis not present

## 2022-02-18 DIAGNOSIS — F17210 Nicotine dependence, cigarettes, uncomplicated: Secondary | ICD-10-CM | POA: Diagnosis not present

## 2022-02-18 LAB — CBC WITH DIFFERENTIAL (CANCER CENTER ONLY)
Abs Immature Granulocytes: 0.01 10*3/uL (ref 0.00–0.07)
Basophils Absolute: 0 10*3/uL (ref 0.0–0.1)
Basophils Relative: 1 %
Eosinophils Absolute: 0.2 10*3/uL (ref 0.0–0.5)
Eosinophils Relative: 3 %
HCT: 46.7 % — ABNORMAL HIGH (ref 36.0–46.0)
Hemoglobin: 15.5 g/dL — ABNORMAL HIGH (ref 12.0–15.0)
Immature Granulocytes: 0 %
Lymphocytes Relative: 31 %
Lymphs Abs: 1.8 10*3/uL (ref 0.7–4.0)
MCH: 30.7 pg (ref 26.0–34.0)
MCHC: 33.2 g/dL (ref 30.0–36.0)
MCV: 92.5 fL (ref 80.0–100.0)
Monocytes Absolute: 0.5 10*3/uL (ref 0.1–1.0)
Monocytes Relative: 8 %
Neutro Abs: 3.3 10*3/uL (ref 1.7–7.7)
Neutrophils Relative %: 57 %
Platelet Count: 515 10*3/uL — ABNORMAL HIGH (ref 150–400)
RBC: 5.05 MIL/uL (ref 3.87–5.11)
RDW: 16.1 % — ABNORMAL HIGH (ref 11.5–15.5)
WBC Count: 5.8 10*3/uL (ref 4.0–10.5)
nRBC: 0 % (ref 0.0–0.2)

## 2022-02-18 LAB — CMP (CANCER CENTER ONLY)
ALT: 15 U/L (ref 0–44)
AST: 19 U/L (ref 15–41)
Albumin: 4.4 g/dL (ref 3.5–5.0)
Alkaline Phosphatase: 91 U/L (ref 38–126)
Anion gap: 8 (ref 5–15)
BUN: 15 mg/dL (ref 8–23)
CO2: 25 mmol/L (ref 22–32)
Calcium: 9.7 mg/dL (ref 8.9–10.3)
Chloride: 106 mmol/L (ref 98–111)
Creatinine: 0.86 mg/dL (ref 0.44–1.00)
GFR, Estimated: >60 mL/min (ref >60)
Glucose, Bld: 105 mg/dL — ABNORMAL HIGH (ref 70–99)
Potassium: 4.2 mmol/L (ref 3.5–5.1)
Sodium: 139 mmol/L (ref 135–145)
Total Bilirubin: 0.1 mg/dL — ABNORMAL LOW (ref 0.3–1.2)
Total Protein: 7.5 g/dL (ref 6.5–8.1)

## 2022-02-18 LAB — IRON AND IRON BINDING CAPACITY (CC-WL,HP ONLY)
Iron: 168 ug/dL (ref 28–170)
Saturation Ratios: 40 % — ABNORMAL HIGH (ref 10.4–31.8)
TIBC: 417 ug/dL (ref 250–450)
UIBC: 249 ug/dL (ref 148–442)

## 2022-02-18 LAB — FERRITIN: Ferritin: 34 ng/mL (ref 11–307)

## 2022-02-19 ENCOUNTER — Other Ambulatory Visit: Payer: Self-pay | Admitting: Internal Medicine

## 2022-02-19 DIAGNOSIS — I10 Essential (primary) hypertension: Secondary | ICD-10-CM

## 2022-02-20 ENCOUNTER — Other Ambulatory Visit: Payer: Self-pay

## 2022-02-20 ENCOUNTER — Ambulatory Visit (INDEPENDENT_AMBULATORY_CARE_PROVIDER_SITE_OTHER): Payer: Medicare Other | Admitting: Rehabilitative and Restorative Service Providers"

## 2022-02-20 ENCOUNTER — Encounter: Payer: Self-pay | Admitting: Hematology

## 2022-02-20 ENCOUNTER — Encounter: Payer: Self-pay | Admitting: Rehabilitative and Restorative Service Providers"

## 2022-02-20 DIAGNOSIS — M25611 Stiffness of right shoulder, not elsewhere classified: Secondary | ICD-10-CM | POA: Diagnosis not present

## 2022-02-20 DIAGNOSIS — G8929 Other chronic pain: Secondary | ICD-10-CM

## 2022-02-20 DIAGNOSIS — M6281 Muscle weakness (generalized): Secondary | ICD-10-CM

## 2022-02-20 DIAGNOSIS — M25511 Pain in right shoulder: Secondary | ICD-10-CM | POA: Diagnosis not present

## 2022-02-20 DIAGNOSIS — R600 Localized edema: Secondary | ICD-10-CM

## 2022-02-20 MED ORDER — NEBIVOLOL HCL 10 MG PO TABS
ORAL_TABLET | Freq: Every day | ORAL | 1 refills | Status: DC
  Filled 2022-02-20: qty 90, 90d supply, fill #0
  Filled 2022-07-02: qty 90, 90d supply, fill #1

## 2022-02-20 NOTE — Therapy (Signed)
OUTPATIENT PHYSICAL THERAPY TREATMENT NOTE    Patient Name: [name] [name] MRN: [id number] DOB:[birth year], 66 y.o., female Today's Date: 02/20/2022   END OF SESSION:   PT End of Session - 02/20/22 1515     Visit Number 17    Number of Visits 20    Date for PT Re-Evaluation 03/06/22    Authorization Type Medicare - kx after 15    Progress Note Due on Visit 20    PT Start Time 1510    PT Stop Time 1550    PT Time Calculation (min) 40 min    Activity Tolerance Patient tolerated treatment well   pain in mobility   Behavior During Therapy WFL for tasks assessed/performed                Past Medical History:  Diagnosis Date   Allergy    seasonal   Anemia    Anxiety    takes Lexapro daily   Arthritis    "back, from neck down pass my bra area" (03/25/2017)   Asthma    Bartholin gland cyst 08/29/2011   Bruises easily    pt is on Effient   Chronic back pain    herniated nucleus pulposus   Chronic back pain    "neck to bra area; lower back" (03/25/2017)   Chronic kidney disease    recurrent UTI's this year 2022   COPD (chronic obstructive pulmonary disease) (HCC)    early stages   Coronary artery disease    Depression    takes Klonopin daily   Diabetes mellitus without complication ([place])    Diverticulosis    Fibroadenoma of left breast    GERD (gastroesophageal reflux disease)    takes Nexium daily   H/O hiatal hernia    Heart attack ([place]) 2011   Hemorrhoids    Hernia    Hyperlipidemia    takes Lipitor daily   Hypertension    takes Losartan daily and Labetalol bid   Hypothyroidism    takes Synthroid daily   Insomnia    hydroxyzine prn   Joint pain    Pneumonia    "couple times" (03/25/2017)   Pre-diabetes    "just found out 1 wk ago" (03/25/2017)   Psoriasis    elbows,knees,back   Shortness of breath    with exertion   Slowing of urinary stream    Stress incontinence    Past Surgical History:  Procedure Laterality Date   [place]   "left one of my ovaries"   BACK SURGERY     BIOPSY  05/27/2019   Procedure: BIOPSY;  Surgeon: Daneil Dolin, MD;  Location: [facility];  Service: Endoscopy;;   COLONOSCOPY  2011   Dr. Ardis Hughs: Mild diverticulosis, descending diminutive colon polyp (not retrieved), next colonoscopy 10 years   CORONARY ANGIOPLASTY WITH STENT PLACEMENT  2011 X2   "regular stents didn't work; had to go back in in ~ 1 month and put in medicated stents"   DILATION AND CURETTAGE OF UTERUS     ESOPHAGOGASTRODUODENOSCOPY     ESOPHAGOGASTRODUODENOSCOPY (EGD) WITH PROPOFOL N/A 05/27/2019   normal esophagus, dilation, erosive gastropathy s/p biopsy, normal duodenum. Negative H.pylori.    LEFT HEART CATH AND CORONARY ANGIOGRAPHY N/A 03/26/2017   Procedure: LEFT HEART CATH AND CORONARY ANGIOGRAPHY;  Surgeon: Belva Crome, MD;  Location: [facility];  Service: Cardiovascular;  Laterality: N/A;   LUMBAR LAMINECTOMY/DECOMPRESSION MICRODISCECTOMY  08/05/2011   Procedure: LUMBAR LAMINECTOMY/DECOMPRESSION MICRODISCECTOMY;  Surgeon: Otilio Connors, MD;  Location: [facility];  Service: Neurosurgery;  Laterality: Right;  Right Lumbar four-five extraforaminal discectomy   MALONEY DILATION N/A 05/27/2019   Procedure: Venia Minks DILATION;  Surgeon: Daneil Dolin, MD;  Location: [facility];  Service: Endoscopy;  Laterality: N/A;  54   SHOULDER ARTHROSCOPY WITH ROTATOR CUFF REPAIR AND OPEN BICEPS TENODESIS Right 12/24/2021   Procedure: RIGHT SHOULDER ARTHROSCOPY WITH ROTATOR CUFF REPAIR AND BICEPS TENODESIS;  Surgeon: Marybelle Killings, MD;  Location: [facility];  Service: Orthopedics;  Laterality: Right;   TONSILLECTOMY     as a child   UPPER GASTROINTESTINAL ENDOSCOPY     Patient Active Problem List   Diagnosis Date Noted   Nontraumatic complete tear of right rotator cuff    Superior glenoid labrum lesion of left shoulder    Iron deficiency anemia 11/02/2021   Major  depressive disorder, recurrent, in full remission with anxious distress ([place]) 05/31/2021   Rotator cuff tendinitis, right 05/08/2021   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   Chronic neck pain 02/26/2021   GAD (generalized anxiety disorder) 01/08/2021   Acute cystitis without hematuria 11/07/2020   Influenza vaccine needed 06/15/2020   Abdominal pain, epigastric 04/28/2019   Esophageal dysphagia 04/28/2019   Bipolar I disorder ([place]) 09/17/2018   Insomnia 10/28/2017   Prediabetes 05/28/2017   QT prolongation 03/25/2017   Encounter for screening mammogram for breast cancer 09/11/2015   Psoriasis 06/22/2015   COPD (chronic obstructive pulmonary disease) (HCC)GOLD E 05/18/2015   Anxiety and depression 07/10/2013   Essential hypertension, benign 06/07/2013   Dyslipidemia 06/07/2013   Asthma, chronic 06/07/2013   Emphysema lung ([place]) 06/07/2013   Chronic back pain 06/07/2013   CAD S/P percutaneous coronary angioplasty 06/07/2013   GERD (gastroesophageal reflux disease) 06/07/2013   Breast lump on left side at 3 o'clock position 10/13/2012   S/P abdominal hysterectomy and right salpingo-oophorectomy 08/29/2011   TOBACCO ABUSE 07/17/2009   Chronic rhinitis 07/17/2009   Lung nodule < 6cm on CT 07/17/2009   ALLERGY, FOOD 07/17/2009   Hypothyroidism 12/22/2008     THERAPY DIAG:  Chronic right shoulder pain  Muscle weakness (generalized)  Stiffness of right shoulder, not elsewhere classified  Localized edema  PCP: Troy Sine MD   REFERRING PROVIDER: Marybelle Killings, MD   REFERRING DIAG: M75.81 (ICD-10-CM) - Rotator cuff tendinitis, right   Rationale for Evaluation and Treatment Rehabilitation   ONSET DATE: Surgery 12/24/2021   SUBJECTIVE:  SUBJECTIVE STATEMENT: Pt indicated general  soreness after activity (in clinic and at home).  Was feeling some pain today but took aleve and it helped.  Pt reported doing more activity at home.   PERTINENT HISTORY: Chronic back pain, CKD, COPD, CAD, depression, DM, GERD   PAIN:  NPRS scale: 6/10 upon arrival.   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    OBJECTIVE:    PATIENT SURVEYS:  02/13/2022:  FOTO update:   48  12/26/2021: FOTO intake:  20  predicted:  52   POSTURE: 12/26/2021 Rounded shoulder, forward head posture.  Rt arm in sling upon arrival              Localized edema Rt shoulder visibly    UPPER EXTREMITY ROM:    ROM Right 12/26/2021 Passive in supine - All limited by pain Right 01/02/2022  PROM - limited by pain response Right 01/09/2022  PROM supine PROM Right 01/11/2022 assessed supine 01/18/2022 PROM in supine 01/25/2022: PROM in supine 02/01/2022: AAROM  Shoulder flexion 10 100 in supine 125 in spuine  115 supine 125 supine 125 supine 145 supine  Shoulder extension          Shoulder abduction 5 90 in supine    99 in supine   Shoulder adduction          Shoulder internal rotation To belly With arm at side   55 degrees assessed at 70 degrees abduction     Shoulder external rotation -20 With arm at side 0 in 30 deg abduction  42 in 30 deg abduction supine 50 degrees assessed at 70 degrees abduction 53 degrees assessed at 70 degrees abduction 50 degrees assessed at 60 degrees abduction   Elbow flexion WFL passively        Elbow extension -20 deg c pain in shoulder passively         (Blank rows = not tested)   UPPER EXTREMITY MMT:                         12/26/2021:  not tested due to protocol for surgery - Rt shoulder MMT Right 02/08/2022   Right 02/20/2022    Shoulder flexion  2+/5  3/5 - shrug noted against gravity  Shoulder extension      Shoulder abduction      Shoulder adduction      Shoulder internal rotation      Shoulder external rotation      Middle trapezius      Lower trapezius      Elbow flexion      Elbow extension      Wrist flexion      Wrist extension      Wrist ulnar deviation      Wrist radial deviation      Wrist pronation      Wrist supination      Grip strength (lbs)      (Blank rows = not tested)   SHOULDER SPECIAL TESTS:  12/26/2021: none tested today   JOINT MOBILITY TESTING:  12/26/2021 heavy muscle guarding and pain limited full assessment for Rt shoulder.    PALPATION:  12/26/2021 Tenderness throughout Rt shoulder region in muscles and joint, incision sites.               TODAY'S TREATMENT:  02/20/2022 Therex:  Dwaine Deter fwd/back 3 mins each way lvl 2.5   Standing UE ranger flexion, scaption AAROM x 15 each  Standing assisted flexion to 100 deg , eccentric lowering Rt arm only x 10    Seated pulley flexion, scaption c focus on outstretched arm assisted lifting and eccentric control lower with Rt arm 3 mins each  Standing wall push up c SA press x 10 (challenging)  Supine horizontal abduction band pull bilateral 2 x 10  Supine protraction hold 3 seconds in 110 deg flexion x 10 1 lb weight with visual reaching target Seated Scapular retraction c bilateral ER red band 2 x 10    02/15/2022 Therex:  UBE AAROM fwd/back 3 mins each way lvl 2.5  Supine AAROM flexion x 15 with 2-3 second holds 2 lb bar Supine protraction hold 3 seconds bil. in 90 deg flexion 2lb bar x 10 Supine protraction hold 3 seconds in 110 deg flexion x 10 with visual reaching target Supine AROM flexion 1 lb 2 x 10   Sidelying shoulder  abduction 1 lb 2 x 10, cueing for positioning  Seated Scapular retraction c bilateral ER x 10, verbal cueing for elbow positioning Seated Scapular retraction c bilateral ER red band x 10, added to HEP  Standing ER isometric press into wall 10 sec hold x 6 reps Standing rows green 2x10 Standing UE ranger AAROM flexion c ipsilateral leg stepping (to mimic reaching into cabinet) x 15  Manual:   supine g3 inferior jt mobs in flexion, scaption.   Active contract /relax c compression to Rt lat for elevation gains  Modalities:  Vasopnuematic device X 10 min to Rt shoulder, 34 deg, Low compression   02/13/2022 Therex:  UBE AAROM fwd/back 3 mins each way lvl 2.5 Pulley flexion 3 mins, scaption 3 mins c long lever arm  Standing UE ranger AAROM flexion c ipsilateral leg stepping ( to mimic reaching into cabinet) x 15  Supine AAROM flexion x 15 with 2-3 second holds 2 lb bar Supine protraction hold 3 seconds in 110 deg flexion x 10 Supine AROM flexion 1 lb 2 x 10   Seated Scapular retraction c bilateral ER x 10   Standing ER isometric press into wall 10 sec hold x 6 reps, verbal cues to keep arm positioning neutral and chest forward Standing low row isometric into counter c arm at side 10 sec x 6   Modalities:  Vasopnuematic device X 10 min to Rt shoulder, 34 deg, Low compression  PATIENT EDUCATION: 02/13/2022 Education details: HEP progression Person educated: Patient Education method: Consulting civil engineer, Media planner, Verbal cues, and Handouts Education comprehension: verbalized understanding, returned demonstration, and verbal cues required     HOME EXERCISE PROGRAM: Access Code: [email or web address] URL: [email or web address] Date: 02/15/2022 Prepared by: Scot Jun  Exercises - Seated Scapular Retraction  - 3-5 x daily - 7 x weekly - 1 sets - 10 reps - 3-5 hold - Supine Shoulder Flexion Extension AAROM with Dowel  - 2-3 x daily - 7 x weekly - 1-2 sets - 10-15 reps - 3 hold -  Supine Shoulder External Rotation with Dowel (Mirrored)  - 2-3 x daily - 7  x weekly - 1 sets - 10 reps - 5 hold - Supine Shoulder Flexion Extension Full Range AROM  - 1-2 x daily - 7 x weekly - 2-3 sets - 10-15 reps - Standing Isometric Shoulder External Rotation with Doorway (Mirrored)  - 2 x daily - 7 x weekly - 1 sets - 5-10 reps - 10 hold - Standing Isometric Shoulder Extension with Doorway (Mirrored)  - 1 x daily - 7 x weekly - 1 sets - 5-10 reps - 10 hold - Sidelying Shoulder Abduction Palm Forward  - 1-2 x daily - 7 x weekly - 2-3 sets - 10-15 reps - Shoulder External Rotation and Scapular Retraction with Resistance  - 3-5 x daily - 7 x weekly - 1-2 sets - 10 reps   ASSESSMENT:   CLINICAL IMPRESSION: Fatigue noted in early strengthening in active movements.  Shrug noted in elevation above shoulder.  Medical necessity for continued treatment indicated to progress ability to lift/carry in home    OBJECTIVE IMPAIRMENTS decreased activity tolerance, decreased coordination, decreased endurance, decreased mobility, decreased ROM, decreased strength, hypomobility, increased edema, increased fascial restrictions, impaired perceived functional ability, impaired flexibility, impaired UE functional use, improper body mechanics, and pain.    ACTIVITY LIMITATIONS carrying, lifting, bed mobility, bathing, toileting, dressing, reach over head, hygiene/grooming, and locomotion level   PARTICIPATION LIMITATIONS: meal prep, cleaning, laundry, interpersonal relationship, community activity, occupation, and yard work   PERSONAL FACTORS  Chronic back pain, CKD, COPD, CAD, depression, DM, GERD  are also affecting patient's functional outcome.    REHAB POTENTIAL: Good   CLINICAL DECISION MAKING: Stable/uncomplicated   EVALUATION COMPLEXITY: Low     GOALS: Goals reviewed with patient? Yes   Short term PT Goals (target date for Short term goals are 3 weeks 01/16/2022) Patient will demonstrate independent  use of home exercise program to maintain progress from in clinic treatments. Goal status: MET 01/16/22   Long term PT goals (target dates for all long term goals are 10 weeks  03/06/2022 )   1. Patient will demonstrate/report pain at worst less than or equal to 2/10 to facilitate minimal limitation in daily activity secondary to pain symptoms. Goal status: on going - assessed 01/25/2022   2. Patient will demonstrate independent use of home exercise program to facilitate ability to maintain/progress functional gains from skilled physical therapy services. Goal status: on going - assessed 01/25/2022   3. Patient will demonstrate FOTO outcome > or = 52 % to indicate reduced disability due to condition. Goal status: on going - assessed 01/25/2022   4.  Patient will demonstrate Rt [place] joint mobility WFL to facilitate usual self care, dressing, reaching overhead at PLOF s limitation due to symptoms.  Goal status: on going - assessed 01/25/2022   5.  Patient will demonstrate Rt UE MMT 4/5 or greater throughout to facilitate usual lifting, carrying in functional activity to PLOF s limitation.   Goal status: on going - assessed 01/25/2022   6.  Patient will demonstrate ability to sleep s restriction due to symptom.  Goal status: on going - assessed 01/25/2022     PLAN: PT FREQUENCY:  2-3x/week , then 1-2x/week as able   PT DURATION: 10 weeks   PLANNED INTERVENTIONS: Therapeutic exercises, Therapeutic activity, Neuro Muscular re-education, Balance training, Gait training, Patient/Family education, Joint mobilization, Stair training, DME instructions, Dry Needling, Electrical stimulation, Cryotherapy, Moist heat, Taping, Ultrasound, Ionotophoresis 22m/ml Dexamethasone, and Manual therapy.  All included unless contraindicated   PLAN FOR NEXT SESSION: continue  AAROM- AROM progression, avoiding shrug.     Scot Jun, PT, DPT, OCS, ATC 02/20/22  3:54 PM

## 2022-02-21 ENCOUNTER — Other Ambulatory Visit: Payer: Self-pay

## 2022-02-24 NOTE — Progress Notes (Signed)
Marland Kitchen   HEMATOLOGY/ONCOLOGY CLINIC VISIT NOTE  Date of Service: 02/18/2022   Patient Care Team: Pcp, No as PCP - General Troy Sine, MD as PCP - Cardiology (Cardiology) Gala Romney, [name], MD as Consulting Physician (Gastroenterology)   CHIEF COMPLAINTS/PURPOSE OF CONSULTATION:  Follow-up for management of thrombocytosis and CHIP   HISTORY OF PRESENTING ILLNESS:  Please see previous note for details on initial presentation  INTERVAL HISTORY:   [name] [name] is here with her daughter for continued evaluation and management of her thrombocytosis and CHIP. Patient notes no acute new symptoms since her last clinic visit. Notes no overt evidence of GI bleeding. No fevers no chills no night sweats no unexpected weight loss. Labs done today were discussed in detail with the patient.  MEDICAL HISTORY:  Past Medical History:  Diagnosis Date   Allergy    seasonal   Anemia    Anxiety    takes Lexapro daily   Arthritis    "back, from neck down pass my bra area" (03/25/2017)   Asthma    Bartholin gland cyst 08/29/2011   Bruises easily    pt is on Effient   Chronic back pain    herniated nucleus pulposus   Chronic back pain    "neck to bra area; lower back" (03/25/2017)   Chronic kidney disease    recurrent UTI's this year 2022   COPD (chronic obstructive pulmonary disease) (HCC)    early stages   Coronary artery disease    Depression    takes Klonopin daily   Diabetes mellitus without complication ([place])    Diverticulosis    Fibroadenoma of left breast    GERD (gastroesophageal reflux disease)    takes Nexium daily   H/O hiatal hernia    Heart attack ([place]) 2011   Hemorrhoids    Hernia    Hyperlipidemia    takes Lipitor daily   Hypertension    takes Losartan daily and Labetalol bid   Hypothyroidism    takes Synthroid daily   Insomnia    hydroxyzine prn   Joint pain    Pneumonia    "couple times" (03/25/2017)   Pre-diabetes    "just found out 1 wk ago" (03/25/2017)    Psoriasis    elbows,knees,back   Shortness of breath    with exertion   Slowing of urinary stream    Stress incontinence     SURGICAL HISTORY: Past Surgical History:  Procedure Laterality Date   [place]   "left one of my ovaries"   BACK SURGERY     BIOPSY  05/27/2019   Procedure: BIOPSY;  Surgeon: Daneil Dolin, MD;  [facility];  Service: Endoscopy;;   COLONOSCOPY  2011   Dr. Ardis Hughs: Mild diverticulosis, descending diminutive colon polyp (not retrieved), next colonoscopy 10 years   CORONARY ANGIOPLASTY WITH STENT PLACEMENT  2011 X2   "regular stents didn't work; had to go back in in ~ 1 month and put in medicated stents"   DILATION AND CURETTAGE OF UTERUS     ESOPHAGOGASTRODUODENOSCOPY     ESOPHAGOGASTRODUODENOSCOPY (EGD) WITH PROPOFOL N/A 05/27/2019   normal esophagus, dilation, erosive gastropathy s/p biopsy, normal duodenum. Negative H.pylori.    LEFT HEART CATH AND CORONARY ANGIOGRAPHY N/A 03/26/2017   Procedure: LEFT HEART CATH AND CORONARY ANGIOGRAPHY;  Surgeon: Belva Crome, MD;  Location: [facility];  Service: Cardiovascular;  Laterality: N/A;   LUMBAR LAMINECTOMY/DECOMPRESSION  MICRODISCECTOMY  08/05/2011   Procedure: LUMBAR LAMINECTOMY/DECOMPRESSION MICRODISCECTOMY;  Surgeon: Otilio Connors, MD;  Location: [facility];  Service: Neurosurgery;  Laterality: Right;  Right Lumbar four-five extraforaminal discectomy   MALONEY DILATION N/A 05/27/2019   Procedure: Venia Minks DILATION;  Surgeon: Daneil Dolin, MD;  [facility];  Service: Endoscopy;  Laterality: N/A;  54   SHOULDER ARTHROSCOPY WITH ROTATOR CUFF REPAIR AND OPEN BICEPS TENODESIS Right 12/24/2021   Procedure: RIGHT SHOULDER ARTHROSCOPY WITH ROTATOR CUFF REPAIR AND BICEPS TENODESIS;  Surgeon: Marybelle Killings, MD;  Location: [facility];  Service: Orthopedics;  Laterality: Right;   TONSILLECTOMY     as a child   UPPER  GASTROINTESTINAL ENDOSCOPY      SOCIAL HISTORY: Social History   Socioeconomic History   Marital status: Divorced    Spouse name: Not on file   Number of children: 1   Years of education: Not on file   Highest education level: 9th grade  Occupational History   Not on file  Tobacco Use   Smoking status: Every Day    Packs/day: 0.50    Years: 50.00    Total pack years: 25.00    Types: Cigarettes   Smokeless tobacco: Never  Vaping Use   Vaping Use: Some days   Substances: Nicotine  Substance and Sexual Activity   Alcohol use: Yes    Comment: once a week   Drug use: Yes    Types: Marijuana    Comment: history of cocaine use, been about a year, per patient (12/18/21)   Sexual activity: Not Currently    Birth control/protection: Surgical  Other Topics Concern   Not on file  Social History Narrative   Not on file   Social Determinants of Health   Financial Resource Strain: Low Risk  (06/09/2018)   Overall Financial Resource Strain (CARDIA)    Difficulty of Paying Living Expenses: Not hard at all  Food Insecurity: Food Insecurity Present (12/20/2021)   Hunger Vital Sign    Worried About [place] in the Last Year: Sometimes true    Ran Out of Food in the Last Year: Sometimes true  Transportation Needs: Unmet Transportation Needs (12/20/2021)   PRAPARE - Hydrologist (Medical): Yes    Lack of Transportation (Non-Medical): No  Physical Activity: Inactive (12/20/2021)   Exercise Vital Sign    Days of Exercise per Week: 0 days    Minutes of Exercise per Session: 0 min  Stress: Stress Concern Present (12/20/2021)   [place]    Feeling of Stress : To some extent  Social Connections: Moderately Isolated (12/20/2021)   Social Connection and Isolation Panel [NHANES]    Frequency of Communication with Friends and Family: More than three times a week    Frequency of Social Gatherings  with Friends and Family: More than three times a week    Attends Religious Services: 1 to 4 times per year    Active Member of [employer] or Organizations: No    Attends Archivist Meetings: Never    Marital Status: Divorced  Human resources officer Violence: Not At Risk (12/20/2021)   Humiliation, Afraid, Rape, and Kick questionnaire    Fear of Current or Ex-Partner: No    Emotionally Abused: No    Physically Abused: No    Sexually Abused: No    FAMILY HISTORY: Family History  Problem Relation Age of Onset   Heart  disease Mother    Hypertension Mother    Stroke Mother    Mental illness Mother    Heart disease Father    Hypertension Father    Diabetes Father    Colon polyps Brother    Breast cancer Maternal Aunt    Throat cancer Maternal Uncle    Thyroid disease Paternal Aunt    Heart disease Maternal Grandfather    Cancer Paternal Grandmother        mouth   Anesthesia problems Daughter    Hypotension Neg Hx    Malignant hyperthermia Neg Hx    Pseudochol deficiency Neg Hx    Colon cancer Neg Hx    Stomach cancer Neg Hx    Esophageal cancer Neg Hx    Pancreatic cancer Neg Hx    Rectal cancer Neg Hx     ALLERGIES:  is allergic to avocado, latex, and codeine.  MEDICATIONS:  Current Outpatient Medications  Medication Sig Dispense Refill   albuterol (VENTOLIN HFA) 108 (90 Base) MCG/ACT inhaler INHALE 2 PUFFS INTO THE LUNGS EVERY 4-6 HOURS AS NEEDED FOR WHEEZING OR SHORTNESS OF BREATH. 18 g 2   amLODipine (NORVASC) 5 MG tablet TAKE 1 TABLET (5 MG TOTAL) BY MOUTH DAILY. 90 tablet 0   aspirin EC 81 MG tablet Take 81 mg by mouth daily.     atorvastatin (LIPITOR) 80 MG tablet Take 1 tablet (80 mg total) by mouth daily. please schedule appointment for more refills 30 tablet 0   busPIRone (BUSPAR) 15 MG tablet TAKE 2 TABLETS IN THE MORNING, 1 TAB AT NOON AND 1 TAB IN THE EVENING (Patient taking differently: Take 60 mg by mouth at bedtime.) 120 tablet 3   clonazePAM (KLONOPIN) 0.5  MG tablet Take 1 tablet (0.5 mg total) by mouth 2 (two) times daily as needed for anxiety. 60 tablet 0   doxepin (SINEQUAN) 75 MG capsule Take 1 capsule (75 mg total) by mouth at bedtime. 30 capsule 3   escitalopram (LEXAPRO) 10 MG tablet TAKE 3 TABLETS (30 MG TOTAL) BY MOUTH DAILY. 90 tablet 3   ferrous sulfate 325 (65 FE) MG tablet Take 1 tablet (325 mg total) by mouth daily with breakfast. 100 tablet 1   Fluticasone-Umeclidin-Vilant (TRELEGY ELLIPTA) 100-62.5-25 MCG/ACT AEPB Inhale 1 puff into the lungs daily 60 each 2   furosemide (LASIX) 20 MG tablet Take 1 tablet (20 mg total) by mouth daily. 30 tablet 2   gabapentin (NEURONTIN) 600 MG tablet TAKE 1 TABLET BY MOUTH 4 TIMES DAILY 360 tablet 1   levothyroxine (SYNTHROID) 112 MCG tablet Take 1 tablet (112 mcg total) by mouth daily. 30 tablet 6   losartan (COZAAR) 50 MG tablet TAKE 1 & 1/2 TABLETS BY MOUTH DAILY. 45 tablet 2   metFORMIN (GLUCOPHAGE) 500 MG tablet TAKE 1 TABLET (500 MG TOTAL) BY MOUTH 2 (TWO) TIMES DAILY WITH A MEAL. 60 tablet 3   methocarbamol (ROBAXIN) 500 MG tablet Take 1 tablet (500 mg total) by mouth 2 (two) times daily as needed for muscle spasms. 60 tablet 1   mirtazapine (REMERON) 30 MG tablet TAKE 1 TABLET (30 MG TOTAL) BY MOUTH AT BEDTIME. 30 tablet 3   montelukast (SINGULAIR) 10 MG tablet TAKE 1 TABLET BY MOUTH AT BEDTIME. 30 tablet 2   omeprazole (PRILOSEC) 40 MG capsule TAKE 1 CAPSULE (40 MG TOTAL) BY MOUTH 2 (TWO) TIMES DAILY BEFORE A MEAL. 60 capsule 2   sodium chloride (OCEAN) 0.65 % SOLN nasal spray Place 1 spray into  both nostrils as needed for congestion.     ticagrelor (BRILINTA) 60 MG TABS tablet TAKE 1 TABLET (60 MG TOTAL) BY MOUTH 2 (TWO) TIMES DAILY. 60 tablet 5   traMADol (ULTRAM) 50 MG tablet Take 1 tablet (50 mg total) by mouth every 6 (six) hours as needed. 40 tablet 0   TRUEPLUS LANCETS 28G MISC 28 g by Does not apply route QID. 120 each 2   Blood Glucose Monitoring Suppl (TRUE METRIX METER) DEVI 1 kit  by Does not apply route 4 (four) times daily. 1 Device 0   Chlorpheniramine-DM (COUGH & COLD HBP PO) Take 1 tablet by mouth daily as needed (congestion). (Patient not taking: Reported on 02/18/2022)     glucose blood (TRUE METRIX BLOOD GLUCOSE TEST) test strip Use as instructed 100 each 12   ipratropium-albuterol (DUONEB) 0.5-2.5 (3) MG/3ML SOLN TAKE 3 MLS VIA NEBULIZATION EVERY 6 HOURS (Patient not taking: Reported on 02/18/2022) 360 mL 1   loperamide (IMODIUM A-D) 2 MG tablet Take 4 mg by mouth 4 (four) times daily as needed for diarrhea or loose stools. (Patient not taking: Reported on 02/18/2022)     nebivolol (BYSTOLIC) 10 MG tablet TAKE 1 TABLET (10 MG TOTAL) BY MOUTH DAILY. 90 tablet 1   nicotine (NICODERM CQ - DOSED IN MG/24 HOURS) 21 mg/24hr patch Place 1 patch (21 mg total) onto the skin daily. (Patient not taking: Reported on 02/18/2022) 28 patch 1   nicotine polacrilex (NICOTINE MINI) 4 MG lozenge Use three times a day as needed to stop smoking (Patient not taking: Reported on 02/18/2022) 100 tablet 0   nitroGLYCERIN (NITROSTAT) 0.4 MG SL tablet Place 1 tablet (0.4 mg total) under the tongue every 5 (five) minutes as needed for chest pain. 25 tablet 3   oxyCODONE-acetaminophen (PERCOCET/ROXICET) 5-325 MG tablet Take 1 tablet by mouth every 4 (four) hours as needed for severe pain. (Patient not taking: Reported on 02/18/2022) 50 tablet 0   No current facility-administered medications for this visit.    REVIEW OF SYSTEMS:   10 Point review of Systems was done is negative except as noted above.  PHYSICAL EXAMINATION: ECOG PERFORMANCE STATUS: 2 - Symptomatic, <50% confined to bed  . Vitals:   02/18/22 1421  BP: 133/77  Pulse: 60  Resp: 15  Temp: (!) 97.5 F (36.4 C)  SpO2: 96%    Filed Weights   02/18/22 1421  Weight: 143 lb 14.4 oz (65.3 kg)    .Body mass index is 26.32 kg/m.  NAD GENERAL:alert, in no acute distress and comfortable SKIN: no acute rashes, no significant  lesions EYES: conjunctiva are pink and non-injected, sclera anicteric OROPHARYNX: MMM, no exudates, no oropharyngeal erythema or ulceration NECK: supple, no JVD LYMPH:  no palpable lymphadenopathy in the cervical, axillary or inguinal regions LUNGS: clear to auscultation b/l with normal respiratory effort HEART: regular rate & rhythm ABDOMEN:  normoactive bowel sounds , non tender, not distended. Extremity: no pedal edema PSYCH: alert & oriented x 3 with fluent speech NEURO: no focal motor/sensory deficits  LABORATORY DATA:  I have reviewed the data as listed  .    Latest Ref Rng & Units 02/18/2022    1:51 PM 11/28/2021    4:45 PM 11/16/2021    4:35 PM  CBC  WBC 4.0 - 10.5 K/uL 5.8  7.1  8.6   Hemoglobin 12.0 - 15.0 g/dL 15.5  12.5  10.9   Hematocrit 36.0 - 46.0 % 46.7  38.3  34.3   Platelets  150 - 400 K/uL 515  429.0  531.0     .    Latest Ref Rng & Units 02/18/2022    1:51 PM 12/18/2021    3:40 PM 10/03/2021   12:30 PM  CMP  Glucose 70 - 99 mg/dL 105  112  104   BUN 8 - 23 mg/dL '15  12  10   ' Creatinine 0.44 - 1.00 mg/dL 0.86  0.98  0.78   Sodium 135 - 145 mmol/L 139  139  139   Potassium 3.5 - 5.1 mmol/L 4.2  3.9  4.0   Chloride 98 - 111 mmol/L 106  105  105   CO2 22 - 32 mmol/L '25  24  28   ' Calcium 8.9 - 10.3 mg/dL 9.7  9.2  9.8   Total Protein 6.5 - 8.1 g/dL 7.5   7.2   Total Bilirubin 0.3 - 1.2 mg/dL 0.1   0.4   Alkaline Phos 38 - 126 U/L 91   97   AST 15 - 41 U/L 19   11   ALT 0 - 44 U/L 15   11    . 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 No results  found.  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 Reactive from iron deficiency. 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 Her hemoglobin has improved to 15.5 and ferritin is improved to 34 with an iron saturation of 40%. -She tolerated her previous IV iron well needs for additional IV iron at this time. -We discussed that her continued smoking could certainly be a reason for inflammatory increase in her platelets and patient was recommended to pursue complete smoking cessation.. -No evidence of progression of the patient's suspected chip at this time Follow-up RTC with Dr Irene Limbo with labs in 6 months  The total time spent in the appointment was 20 minutes*.  All of the patient's questions were answered with apparent satisfaction. The patient knows to call the clinic with any problems, questions or concerns.   Sullivan Lone MD MS AAHIVMS [facility] Hematology/Oncology Physician [facility]  .*Total Encounter Time as defined by the Centers for Medicare and Medicaid Services includes, in addition to the face-to-face time of a patient visit (documented in the note above) non-face-to-face time: obtaining and reviewing outside history, ordering and reviewing medications, tests or procedures, care coordination (communications with other health care professionals or caregivers) and documentation in the medical record.

## 2022-02-25 ENCOUNTER — Encounter: Payer: Self-pay | Admitting: Hematology

## 2022-02-26 ENCOUNTER — Encounter: Payer: Medicare Other | Admitting: Physical Therapy

## 2022-02-26 ENCOUNTER — Ambulatory Visit (INDEPENDENT_AMBULATORY_CARE_PROVIDER_SITE_OTHER): Payer: Medicare Other | Admitting: Orthopaedic Surgery

## 2022-02-26 ENCOUNTER — Encounter: Payer: Self-pay | Admitting: Orthopaedic Surgery

## 2022-02-26 VITALS — BP 170/82 | HR 62 | Ht 62.0 in | Wt 143.0 lb

## 2022-02-26 DIAGNOSIS — M7501 Adhesive capsulitis of right shoulder: Secondary | ICD-10-CM

## 2022-02-26 DIAGNOSIS — M75121 Complete rotator cuff tear or rupture of right shoulder, not specified as traumatic: Secondary | ICD-10-CM

## 2022-02-26 NOTE — Progress Notes (Signed)
Post-Op Visit Note   Patient: [name] [name]           Date of Birth: August 26, 1955           MRN: [id number] Visit Date: 02/26/2022 PCP: Pcp, No   Assessment & Plan: Postop right shoulder biceps tenodesis rotator cuff repair with adhesive capsulitis.  She has made excellent progress of physical therapy she still uses stick laying down with overhead motion with both arms.  She is using a pulley at home still doing some wall walking.  Still has weakness of the rotator cuff but is gradually improving and progress has improved with range of motion.  She will finish up her therapy visits and progress and continue with home exercise program and can return to see me as needed.  She can fix her hair reach up in the cabinet as long as is not a heavy object and is gradually continue to strengthen her motion as she improves her strength.  Chief Complaint:  Chief Complaint  Patient presents with   Right Shoulder - Routine Post Op    12/24/2021 Right shoulder arthroscopy, rotator cuff repair, biceps tenodesis   Visit Diagnoses:  1. Adhesive capsulitis of right shoulder   2. Nontraumatic complete tear of right rotator cuff     Plan: Return as needed  Follow-Up Instructions: Return if symptoms worsen or fail to improve.   Orders:  No orders of the defined types were placed in this encounter.  No orders of the defined types were placed in this encounter.   Imaging: No results found.  PMFS History: Patient Active Problem List   Diagnosis Date Noted   Nontraumatic complete tear of right rotator cuff    Superior glenoid labrum lesion of left shoulder    Iron deficiency anemia 11/02/2021   Major depressive disorder, recurrent, in full remission with anxious distress ([place]) 05/31/2021   Rotator cuff tendinitis, right 05/08/2021   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   Chronic neck pain 02/26/2021   GAD (generalized anxiety disorder) 01/08/2021   Acute cystitis without hematuria  11/07/2020   Influenza vaccine needed 06/15/2020   Abdominal pain, epigastric 04/28/2019   Esophageal dysphagia 04/28/2019   Bipolar I disorder ([place]) 09/17/2018   Insomnia 10/28/2017   Prediabetes 05/28/2017   QT prolongation 03/25/2017   Encounter for screening mammogram for breast cancer 09/11/2015   Psoriasis 06/22/2015   COPD (chronic obstructive pulmonary disease) (HCC)GOLD E 05/18/2015   Anxiety and depression 07/10/2013   Essential hypertension, benign 06/07/2013   Dyslipidemia 06/07/2013   Asthma, chronic 06/07/2013   Emphysema lung ([place]) 06/07/2013   Chronic back pain 06/07/2013   CAD S/P percutaneous coronary angioplasty 06/07/2013   GERD (gastroesophageal reflux disease) 06/07/2013   Breast lump on left side at 3 o'clock position 10/13/2012   S/P abdominal hysterectomy and right salpingo-oophorectomy 08/29/2011   TOBACCO ABUSE 07/17/2009   Chronic rhinitis 07/17/2009   Lung nodule < 6cm on CT 07/17/2009   ALLERGY, FOOD 07/17/2009   Hypothyroidism 12/22/2008   Past Medical History:  Diagnosis Date   Allergy    seasonal   Anemia    Anxiety    takes Lexapro daily   Arthritis    "back, from neck down pass my bra area" (03/25/2017)   Asthma    Bartholin gland cyst 08/29/2011   Bruises easily    pt is on Effient   Chronic back pain    herniated nucleus pulposus   Chronic back pain    "  neck to bra area; lower back" (03/25/2017)   Chronic kidney disease    recurrent UTI's this year 2022   COPD (chronic obstructive pulmonary disease) (HCC)    early stages   Coronary artery disease    Depression    takes Klonopin daily   Diabetes mellitus without complication (HCC)    Diverticulosis    Fibroadenoma of left breast    GERD (gastroesophageal reflux disease)    takes Nexium daily   H/O hiatal hernia    Heart attack ([place]) 2011   Hemorrhoids    Hernia    Hyperlipidemia    takes Lipitor daily   Hypertension    takes Losartan daily and Labetalol bid    Hypothyroidism    takes Synthroid daily   Insomnia    hydroxyzine prn   Joint pain    Pneumonia    "couple times" (03/25/2017)   Pre-diabetes    "just found out 1 wk ago" (03/25/2017)   Psoriasis    elbows,knees,back   Shortness of breath    with exertion   Slowing of urinary stream    Stress incontinence     Family History  Problem Relation Age of Onset   Heart disease Mother    Hypertension Mother    Stroke Mother    Mental illness Mother    Heart disease Father    Hypertension Father    Diabetes Father    Colon polyps Brother    Breast cancer Maternal Aunt    Throat cancer Maternal Uncle    Thyroid disease Paternal Aunt    Heart disease Maternal Grandfather    Cancer Paternal Grandmother        [place]   Anesthesia problems Daughter    Hypotension Neg Hx    Malignant hyperthermia Neg Hx    Pseudochol deficiency Neg Hx    Colon cancer Neg Hx    Stomach cancer Neg Hx    Esophageal cancer Neg Hx    Pancreatic cancer Neg Hx    Rectal cancer Neg Hx     Past Surgical History:  Procedure Laterality Date   ABDOMINAL EXPLORATION SURGERY  1977   ABDOMINAL HYSTERECTOMY  1977   "left one of my ovaries"   BACK SURGERY     BIOPSY  05/27/2019   Procedure: BIOPSY;  Surgeon: Daneil Dolin, MD;  [facility];  Service: Endoscopy;;   COLONOSCOPY  2011   Dr. Ardis Hughs: Mild diverticulosis, descending diminutive colon polyp (not retrieved), next colonoscopy 10 years   CORONARY ANGIOPLASTY WITH STENT PLACEMENT  2011 X2   "regular stents didn't work; had to go back in in ~ 1 month and put in medicated stents"   DILATION AND CURETTAGE OF UTERUS     ESOPHAGOGASTRODUODENOSCOPY     ESOPHAGOGASTRODUODENOSCOPY (EGD) WITH PROPOFOL N/A 05/27/2019   normal esophagus, dilation, erosive gastropathy s/p biopsy, normal duodenum. Negative H.pylori.    LEFT HEART CATH AND CORONARY ANGIOGRAPHY N/A 03/26/2017   Procedure: LEFT HEART CATH AND CORONARY ANGIOGRAPHY;  Surgeon: Belva Crome,  MD;  Location: [place] [facility];  Service: Cardiovascular;  Laterality: N/A;   LUMBAR LAMINECTOMY/DECOMPRESSION MICRODISCECTOMY  08/05/2011   Procedure: LUMBAR LAMINECTOMY/DECOMPRESSION MICRODISCECTOMY;  Surgeon: Otilio Connors, MD;  Location: [place] [facility];  Service: Neurosurgery;  Laterality: Right;  Right Lumbar four-five extraforaminal discectomy   MALONEY DILATION N/A 05/27/2019   Procedure: Venia Minks DILATION;  Surgeon: Daneil Dolin, MD;  [facility];  Service: Endoscopy;  Laterality: N/A;  32   SHOULDER ARTHROSCOPY WITH ROTATOR CUFF REPAIR AND OPEN BICEPS TENODESIS Right 12/24/2021   Procedure: RIGHT SHOULDER ARTHROSCOPY WITH ROTATOR CUFF REPAIR AND BICEPS TENODESIS;  Surgeon: Marybelle Killings, MD;  Location: [place];  Service: Orthopedics;  Laterality: Right;   TONSILLECTOMY     as a child   UPPER GASTROINTESTINAL ENDOSCOPY     Social History   Occupational History   Not on file  Tobacco Use   Smoking status: Every Day    Packs/day: 0.50    Years: 50.00    Total pack years: 25.00    Types: Cigarettes   Smokeless tobacco: Never  Vaping Use   Vaping Use: Some days   Substances: Nicotine  Substance and Sexual Activity   Alcohol use: Yes    Comment: once a week   Drug use: Yes    Types: Marijuana    Comment: history of cocaine use, been about a year, per patient (12/18/21)   Sexual activity: Not Currently    Birth control/protection: Surgical

## 2022-02-27 ENCOUNTER — Encounter: Payer: Self-pay | Admitting: Rehabilitative and Restorative Service Providers"

## 2022-02-27 ENCOUNTER — Ambulatory Visit (INDEPENDENT_AMBULATORY_CARE_PROVIDER_SITE_OTHER): Payer: Medicare Other | Admitting: Rehabilitative and Restorative Service Providers"

## 2022-02-27 DIAGNOSIS — M6281 Muscle weakness (generalized): Secondary | ICD-10-CM | POA: Diagnosis not present

## 2022-02-27 DIAGNOSIS — R600 Localized edema: Secondary | ICD-10-CM | POA: Diagnosis not present

## 2022-02-27 DIAGNOSIS — M25611 Stiffness of right shoulder, not elsewhere classified: Secondary | ICD-10-CM

## 2022-02-27 DIAGNOSIS — M25511 Pain in right shoulder: Secondary | ICD-10-CM

## 2022-02-27 DIAGNOSIS — G8929 Other chronic pain: Secondary | ICD-10-CM

## 2022-02-27 NOTE — Therapy (Signed)
OUTPATIENT PHYSICAL THERAPY TREATMENT NOTE    Patient Name: [name] [name] MRN: [id number] DOB:March 23, 1956, 66 y.o., female Today's Date: 02/27/2022   END OF SESSION:   PT End of Session - 02/27/22 1111     Visit Number 18    Number of Visits 20    Date for PT Re-Evaluation 03/06/22    Authorization Type Medicare - kx after 15    Progress Note Due on Visit 20    PT Start Time 1100    PT Stop Time 1140    PT Time Calculation (min) 40 min    Activity Tolerance Patient tolerated treatment well   pain in mobility   Behavior During Therapy WFL for tasks assessed/performed                 Past Medical History:  Diagnosis Date   Allergy    seasonal   Anemia    Anxiety    takes Lexapro daily   Arthritis    "back, from neck down pass my bra area" (03/25/2017)   Asthma    Bartholin gland cyst 08/29/2011   Bruises easily    pt is on Effient   Chronic back pain    herniated nucleus pulposus   Chronic back pain    "neck to bra area; lower back" (03/25/2017)   Chronic kidney disease    recurrent UTI's this year 2022   COPD (chronic obstructive pulmonary disease) (HCC)    early stages   Coronary artery disease    Depression    takes Klonopin daily   Diabetes mellitus without complication ([place])    Diverticulosis    Fibroadenoma of left breast    GERD (gastroesophageal reflux disease)    takes Nexium daily   H/O hiatal hernia    Heart attack ([place]) 2011   Hemorrhoids    Hernia    Hyperlipidemia    takes Lipitor daily   Hypertension    takes Losartan daily and Labetalol bid   Hypothyroidism    takes Synthroid daily   Insomnia    hydroxyzine prn   Joint pain    Pneumonia    "couple times" (03/25/2017)   Pre-diabetes    "just found out 1 wk ago" (03/25/2017)   Psoriasis    elbows,knees,back   Shortness of breath    with exertion   Slowing of urinary stream    Stress incontinence    Past Surgical History:  Procedure Laterality Date   [place]   "left one of my ovaries"   BACK SURGERY     BIOPSY  05/27/2019   Procedure: BIOPSY;  Surgeon: Daneil Dolin, MD;  [facility]: [facility];  Service: Endoscopy;;   COLONOSCOPY  2011   Dr. Ardis Hughs: Mild diverticulosis, descending diminutive colon polyp (not retrieved), next colonoscopy 10 years   CORONARY ANGIOPLASTY WITH STENT PLACEMENT  2011 X2   "regular stents didn't work; had to go back in in ~ 1 month and put in medicated stents"   DILATION AND CURETTAGE OF UTERUS     ESOPHAGOGASTRODUODENOSCOPY     ESOPHAGOGASTRODUODENOSCOPY (EGD) WITH PROPOFOL N/A 05/27/2019   normal esophagus, dilation, erosive gastropathy s/p biopsy, normal duodenum. Negative H.pylori.    LEFT HEART CATH AND CORONARY ANGIOGRAPHY N/A 03/26/2017   Procedure: LEFT HEART CATH AND CORONARY ANGIOGRAPHY;  Surgeon: Belva Crome, MD;  Location: [place] [facility];  Service: Cardiovascular;  Laterality: N/A;   LUMBAR LAMINECTOMY/DECOMPRESSION  MICRODISCECTOMY  08/05/2011   Procedure: LUMBAR LAMINECTOMY/DECOMPRESSION MICRODISCECTOMY;  Surgeon: Otilio Connors, MD;  Location: [place] [facility];  Service: Neurosurgery;  Laterality: Right;  Right Lumbar four-five extraforaminal discectomy   MALONEY DILATION N/A 05/27/2019   Procedure: Venia Minks DILATION;  Surgeon: Daneil Dolin, MD;  Location: [facility];  Service: Endoscopy;  Laterality: N/A;  54   SHOULDER ARTHROSCOPY WITH ROTATOR CUFF REPAIR AND OPEN BICEPS TENODESIS Right 12/24/2021   Procedure: RIGHT SHOULDER ARTHROSCOPY WITH ROTATOR CUFF REPAIR AND BICEPS TENODESIS;  Surgeon: Marybelle Killings, MD;  Location: [facility];  Service: Orthopedics;  Laterality: Right;   TONSILLECTOMY     as a child   UPPER GASTROINTESTINAL ENDOSCOPY     Patient Active Problem List   Diagnosis Date Noted   Nontraumatic complete tear of right rotator cuff    Superior glenoid labrum lesion of left shoulder    Iron deficiency anemia 11/02/2021   Major  depressive disorder, recurrent, in full remission with anxious distress ([place]) 05/31/2021   Rotator cuff tendinitis, right 05/08/2021   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   Chronic neck pain 02/26/2021   GAD (generalized anxiety disorder) 01/08/2021   Acute cystitis without hematuria 11/07/2020   Influenza vaccine needed 06/15/2020   Abdominal pain, epigastric 04/28/2019   Esophageal dysphagia 04/28/2019   Bipolar I disorder ([place]) 09/17/2018   Insomnia 10/28/2017   Prediabetes 05/28/2017   QT prolongation 03/25/2017   Encounter for screening mammogram for breast cancer 09/11/2015   Psoriasis 06/22/2015   COPD (chronic obstructive pulmonary disease) (HCC)GOLD E 05/18/2015   Anxiety and depression 07/10/2013   Essential hypertension, benign 06/07/2013   Dyslipidemia 06/07/2013   Asthma, chronic 06/07/2013   Emphysema lung ([place]) 06/07/2013   Chronic back pain 06/07/2013   CAD S/P percutaneous coronary angioplasty 06/07/2013   GERD (gastroesophageal reflux disease) 06/07/2013   Breast lump on left side at 3 o'clock position 10/13/2012   S/P abdominal hysterectomy and right salpingo-oophorectomy 08/29/2011   TOBACCO ABUSE 07/17/2009   Chronic rhinitis 07/17/2009   Lung nodule < 6cm on CT 07/17/2009   ALLERGY, FOOD 07/17/2009   Hypothyroidism 12/22/2008     THERAPY DIAG:  Chronic right shoulder pain  Muscle weakness (generalized)  Stiffness of right shoulder, not elsewhere classified  Localized edema  PCP: Troy Sine MD   REFERRING PROVIDER: Marybelle Killings, MD   REFERRING DIAG: M75.81 (ICD-10-CM) - Rotator cuff tendinitis, right   Rationale for Evaluation and Treatment Rehabilitation   ONSET DATE: Surgery 12/24/2021   SUBJECTIVE:  SUBJECTIVE STATEMENT: Pt indicated having  pain with checks at MD visit.  Pt indicated improved some today.  Reported wanting to be able to lift/carry better.   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    OBJECTIVE:    PATIENT SURVEYS:  02/13/2022:  FOTO update:   48  12/26/2021: FOTO intake:  20  predicted:  52   POSTURE: 12/26/2021 Rounded shoulder, forward head posture.  Rt arm in sling upon arrival              Localized edema Rt shoulder visibly    UPPER EXTREMITY ROM:    ROM Right 12/26/2021 Passive in supine - All limited by pain Right 01/02/2022  PROM - limited by pain response Right 01/09/2022  PROM supine PROM Right 01/11/2022 assessed supine 01/18/2022 PROM in supine 01/25/2022: PROM in supine 02/01/2022: AAROM  Shoulder flexion 10 100 in supine 125 in spuine  115 supine 125 supine 125 supine 145 supine  Shoulder extension          Shoulder abduction 5 90 in supine    99 in supine   Shoulder adduction          Shoulder internal rotation To belly With arm at side   55 degrees assessed at 70 degrees abduction     Shoulder external rotation -20 With arm at side 0 in 30 deg abduction  42 in 30 deg abduction supine 50 degrees assessed at 70 degrees abduction 53 degrees assessed at 70 degrees abduction 50 degrees assessed at 60 degrees abduction   Elbow flexion WFL passively        Elbow extension -20 deg c pain in shoulder passively        (Blank rows = not tested)   UPPER EXTREMITY  MMT:                         12/26/2021:  not tested due to protocol for surgery - Rt shoulder MMT Right 02/08/2022   Right 02/20/2022   Right 02/27/2022  Shoulder flexion  2+/5  3/5 - shrug noted against gravity 3+/5 c shrug noted  Shoulder extension       Shoulder abduction       Shoulder adduction       Shoulder internal rotation     5/5  Shoulder external rotation     4/5  Middle trapezius       Lower trapezius       Elbow flexion       Elbow extension       Wrist flexion       Wrist extension       Wrist ulnar deviation       Wrist radial deviation       Wrist pronation       Wrist supination       Grip strength (lbs)       (Blank rows = not tested)   SHOULDER SPECIAL TESTS:  12/26/2021: none tested today   JOINT MOBILITY TESTING:  12/26/2021 heavy muscle guarding and pain limited full assessment for Rt shoulder.    PALPATION:  12/26/2021 Tenderness throughout Rt shoulder region in muscles and joint, incision sites.               TODAY'S TREATMENT:  02/27/2022 Therex:  Dwaine Deter fwd/back 3 mins each way lvl 2.5 Sitting AAROM flexion 1 lb bar c eccentric Rt arm lowering focus 2 x 10  Seated robbery ex (scap retraction c bilateral ER) 2 x 10 green band   Supine horizontal abduction band pull bilateral 2 x 10 green band  Supine 90 deg circles cw, ccw x 20 each 2 lb weight Supine 100 deg protraction Rt shoulder 2 lbs 3 sec hold x 10   Sidelying Rt shoulder abduction 1 lb 2 x 15 Standing green band rows, gh ext x 20 each bilaterally   02/20/2022 Therex:  UBE AAROM fwd/back 3 mins each way lvl 2.5   Standing UE ranger flexion, scaption AAROM x 15 each  Standing assisted flexion to 100 deg , eccentric lowering Rt arm only x 10    Seated pulley flexion, scaption c focus on outstretched arm assisted lifting and eccentric control lower with Rt arm 3 mins each  Standing wall push up c SA press x 10 (challenging)  Supine horizontal abduction band pull bilateral 2  x 10  Supine protraction hold 3 seconds in 110 deg flexion x 10 1 lb weight with visual reaching target Seated Scapular retraction c bilateral ER red band 2 x 10    02/15/2022 Therex:  UBE AAROM fwd/back 3 mins each way lvl 2.5  Supine AAROM flexion x 15 with 2-3 second holds 2 lb bar Supine protraction hold 3 seconds bil. in 90 deg flexion 2lb bar x 10 Supine protraction hold 3 seconds in 110 deg flexion x 10 with visual reaching target Supine AROM flexion 1 lb 2 x 10   Sidelying shoulder abduction 1 lb 2 x 10, cueing for positioning  Seated Scapular retraction c bilateral ER x 10, verbal cueing for elbow positioning Seated Scapular retraction c bilateral ER red band x 10, added to HEP  Standing ER isometric press into wall 10 sec hold x 6 reps Standing rows green 2x10 Standing UE ranger AAROM flexion c ipsilateral leg stepping (to mimic reaching into cabinet) x 15  Manual:   supine g3 inferior jt mobs in flexion, scaption.   Active contract /relax c compression to Rt lat for elevation gains  Modalities:  Vasopnuematic device X 10 min to Rt shoulder, 34 deg, Low compression    PATIENT EDUCATION: 02/27/2022 Education details: HEP progression Person educated: Patient Education method: Consulting civil engineer, Media planner, Verbal cues, and Handouts Education comprehension: verbalized understanding, returned demonstration, and verbal cues required     HOME EXERCISE PROGRAM: Access Code: [email or web address] URL: [email or web address] Date: 02/27/2022 Prepared by: Faustino Congress     Exercises - Seated Scapular Retraction  - 3-5 x daily - 7 x weekly - 1 sets - 10 reps - 3-5 hold - Supine Shoulder Flexion Extension AAROM with Dowel  - 2-3 x daily - 7 x weekly - 1-2 sets - 10-15 reps - 3 hold - Supine Shoulder External Rotation with Dowel (Mirrored)  - 2-3 x daily - 7 x weekly - 1 sets - 10 reps - 5 hold - Supine Shoulder Flexion Extension Full Range AROM  - 1-2 x daily - 7 x  weekly - 2-3 sets -  10-15 reps - Sidelying Shoulder Abduction Palm Forward  - 1-2 x daily - 7 x weekly - 2-3 sets - 10-15 reps - Shoulder External Rotation and Scapular Retraction with Resistance  - 3-5 x daily - 7 x weekly - 1-2 sets - 10 reps - Standing Shoulder Flexion AAROM with Dowel  - 1-2 x daily - 7 x weekly - 2 sets - 10 reps - Sidelying Shoulder Flexion 15 Degrees (Mirrored)  - 1-2 x daily - 7 x weekly - 3 sets - 10 reps   ASSESSMENT:   CLINICAL IMPRESSION: Medical necessity for continued skilled PT services at this time due to weakness in ability to perform arm movements against gravity which impairs functional activity ability.    OBJECTIVE IMPAIRMENTS decreased activity tolerance, decreased coordination, decreased endurance, decreased mobility, decreased ROM, decreased strength, hypomobility, increased edema, increased fascial restrictions, impaired perceived functional ability, impaired flexibility, impaired UE functional use, improper body mechanics, and pain.    ACTIVITY LIMITATIONS carrying, lifting, bed mobility, bathing, toileting, dressing, reach over head, hygiene/grooming, and locomotion level   PARTICIPATION LIMITATIONS: meal prep, cleaning, laundry, interpersonal relationship, community activity, occupation, and yard work   PERSONAL FACTORS  Chronic back pain, CKD, COPD, CAD, depression, DM, GERD  are also affecting patient's functional outcome.    REHAB POTENTIAL: Good   CLINICAL DECISION MAKING: Stable/uncomplicated   EVALUATION COMPLEXITY: Low     GOALS: Goals reviewed with patient? Yes   Short term PT Goals (target date for Short term goals are 3 weeks 01/16/2022) Patient will demonstrate independent use of home exercise program to maintain progress from in clinic treatments. Goal status: MET 01/16/22   Long term PT goals (target dates for all long term goals are 10 weeks  03/06/2022 )   1. Patient will demonstrate/report pain at worst less than or equal to  2/10 to facilitate minimal limitation in daily activity secondary to pain symptoms. Goal status: on going - assessed 02/27/2022   2. Patient will demonstrate independent use of home exercise program to facilitate ability to maintain/progress functional gains from skilled physical therapy services. Goal status: on going - assessed 02/27/2022   3. Patient will demonstrate FOTO outcome > or = 52 % to indicate reduced disability due to condition. Goal status: on going - assessed 02/27/2022   4.  Patient will demonstrate Rt [place] joint mobility WFL to facilitate usual self care, dressing, reaching overhead at PLOF s limitation due to symptoms.  Goal status: on going - assessed 02/27/2022   5.  Patient will demonstrate Rt UE MMT 4/5 or greater throughout to facilitate usual lifting, carrying in functional activity to PLOF s limitation.   Goal status: on going - assessed 02/27/2022   6.  Patient will demonstrate ability to sleep s restriction due to symptom.  Goal status: on going - assessed 02/27/2022     PLAN: PT FREQUENCY:  2-3x/week , then 1-2x/week as able   PT DURATION: 10 weeks   PLANNED INTERVENTIONS: Therapeutic exercises, Therapeutic activity, Neuro Muscular re-education, Balance training, Gait training, Patient/Family education, Joint mobilization, Stair training, DME instructions, Dry Needling, Electrical stimulation, Cryotherapy, Moist heat, Taping, Ultrasound, Ionotophoresis 59m/ml Dexamethasone, and Manual therapy.  All included unless contraindicated   PLAN FOR NEXT SESSION: Avoid shrug, progressive strengthening as tolerated.  Recert due 80/16/0109   MScot Jun PT, DPT, OCS, ATC 02/27/22  11:39 AM

## 2022-02-28 ENCOUNTER — Telehealth (INDEPENDENT_AMBULATORY_CARE_PROVIDER_SITE_OTHER): Payer: Medicare Other | Admitting: Physician Assistant

## 2022-02-28 ENCOUNTER — Encounter (HOSPITAL_COMMUNITY): Payer: Self-pay | Admitting: Physician Assistant

## 2022-02-28 DIAGNOSIS — F411 Generalized anxiety disorder: Secondary | ICD-10-CM

## 2022-02-28 DIAGNOSIS — G4700 Insomnia, unspecified: Secondary | ICD-10-CM | POA: Diagnosis not present

## 2022-02-28 DIAGNOSIS — F3342 Major depressive disorder, recurrent, in full remission: Secondary | ICD-10-CM | POA: Diagnosis not present

## 2022-02-28 MED ORDER — ESCITALOPRAM OXALATE 10 MG PO TABS
ORAL_TABLET | ORAL | 3 refills | Status: DC
  Filled 2022-02-28: qty 90, 30d supply, fill #0
  Filled 2022-07-02: qty 90, 30d supply, fill #1
  Filled 2022-08-12: qty 90, 30d supply, fill #2
  Filled 2022-09-05: qty 90, 30d supply, fill #3

## 2022-02-28 MED ORDER — DOXEPIN HCL 75 MG PO CAPS
75.0000 mg | ORAL_CAPSULE | Freq: Every day | ORAL | 3 refills | Status: DC
  Filled 2022-02-28 – 2022-03-22 (×2): qty 30, 30d supply, fill #0
  Filled 2022-04-23: qty 30, 30d supply, fill #1
  Filled 2022-05-23: qty 30, 30d supply, fill #2
  Filled 2022-07-05 – 2022-07-29 (×3): qty 30, 30d supply, fill #3

## 2022-02-28 MED ORDER — CLONAZEPAM 0.5 MG PO TABS: 0.5000 mg | ORAL_TABLET | Freq: Two times a day (BID) | ORAL | 1 refills | Status: DC | PRN

## 2022-02-28 MED ORDER — MIRTAZAPINE 30 MG PO TABS
ORAL_TABLET | ORAL | 3 refills | Status: DC
  Filled 2022-02-28: qty 30, fill #0
  Filled 2022-04-16: qty 30, 30d supply, fill #0
  Filled 2022-05-23: qty 30, 30d supply, fill #1
  Filled 2022-07-02: qty 30, 30d supply, fill #2
  Filled 2022-08-12: qty 30, 30d supply, fill #3

## 2022-02-28 MED ORDER — BUSPIRONE HCL 15 MG PO TABS
ORAL_TABLET | ORAL | 3 refills | Status: DC
  Filled 2022-02-28: qty 120, fill #0
  Filled 2022-04-16: qty 120, 30d supply, fill #0
  Filled 2022-05-22: qty 120, 30d supply, fill #1
  Filled 2022-07-02: qty 120, 30d supply, fill #2
  Filled 2022-08-12: qty 120, 30d supply, fill #3

## 2022-02-28 NOTE — Progress Notes (Signed)
[facility] MD/PA/NP OP Progress Note  Virtual Visit via Telephone Note  I connected with [name] [name] on 02/28/22 at 11:00 AM EDT by telephone and verified that I am speaking with the correct person using two identifiers.  Location: Patient: Home Provider: Clinic   I discussed the limitations, risks, security and privacy concerns of performing an evaluation and management service by telephone and the availability of in person appointments. I also discussed with the patient that there may be a patient responsible charge related to this service. The patient expressed understanding and agreed to proceed.  Follow Up Instructions:   I discussed the assessment and treatment plan with the patient. The patient was provided an opportunity to ask questions and all were answered. The patient agreed with the plan and demonstrated an understanding of the instructions.   The patient was advised to call back or seek an in-person evaluation if the symptoms worsen or if the condition fails to improve as anticipated.  I provided 10 minutes of non-face-to-face time during this encounter.  Malachy Mood, PA   02/28/2022 11:27 PM [name] [name]  MRN:  [id number]  Chief Complaint:  Chief Complaint  Patient presents with   Follow-up   Medication Refill   HPI:   [name] [name] is a 66 year old female with a past psychiatric history significant for insomnia, generalized anxiety disorder, and major depressive disorder who presents to [facility] via virtual telephone visit for follow-up and medication management.  Patient is currently being managed on the following medications:  Doxepin (Sinequan) 50 mg at bedtime Buspirone 15 mg 3 times daily in the morning/1 tablet at noon/1 tablet in the evening  Escitalopram 30 mg daily Clonazepam 0.5 mg 2 times daily as needed Mirtazapine 30 mg at bedtime  Patient reports that she recently received surgery on her shoulder and is  recovering well.  Patient states that she is staying with her daughter as she recovers.  Patient reports no issues or concerns regarding her current medication regimen patient denies depression but does endorse occasional anxiety along with panic attacks.  Patient rates her anxiety an 8 out of 10 when really nervous.  Patient denies any new stressors at this time.  A GAD-7 screen was performed with the patient scoring a 3.  Patient is alert and oriented x4, pleasant, calm, cooperative, and fully engaged in conversation during the encounter.  Patient endorses good mood.  Patient denies suicidal or homicidal ideations.  She further denies auditory or or visual hallucinations and does not appear to be responding to internal/external stimuli.  Patient endorses good sleep and receives on average 8 hours of sleep each night.  Patient endorses good appetite and eats on average 2-3 meals per day.  Patient endorses alcohol consumption sparingly.  Patient endorses tobacco use and smokes on average half a pack per day as well as engages in vaping.  Patient denies illicit drug use.  Visit Diagnosis:    ICD-10-CM   1. Major depressive disorder, recurrent, in full remission with anxious distress (HCC)  F33.42 doxepin (SINEQUAN) 75 MG capsule    escitalopram (LEXAPRO) 10 MG tablet    busPIRone (BUSPAR) 15 MG tablet    2. GAD (generalized anxiety disorder)  F41.1 escitalopram (LEXAPRO) 10 MG tablet    busPIRone (BUSPAR) 15 MG tablet    clonazePAM (KLONOPIN) 0.5 MG tablet    3. Insomnia, unspecified type  G47.00 mirtazapine (REMERON) 30 MG tablet      Past Psychiatric  History:  Insomnia Major depressive disorder Generalized anxiety disorder  Past Medical History:  Past Medical History:  Diagnosis Date   Allergy    seasonal   Anemia    Anxiety    takes Lexapro daily   Arthritis    "back, from neck down pass my bra area" (03/25/2017)   Asthma    Bartholin gland cyst 08/29/2011   Bruises easily    pt  is on Effient   Chronic back pain    herniated nucleus pulposus   Chronic back pain    "neck to bra area; lower back" (03/25/2017)   Chronic kidney disease    recurrent UTI's this year 2022   COPD (chronic obstructive pulmonary disease) (HCC)    early stages   Coronary artery disease    Depression    takes Klonopin daily   Diabetes mellitus without complication ([place])    Diverticulosis    Fibroadenoma of left breast    GERD (gastroesophageal reflux disease)    takes Nexium daily   H/O hiatal hernia    Heart attack ([place]) 2011   Hemorrhoids    Hernia    Hyperlipidemia    takes Lipitor daily   Hypertension    takes Losartan daily and Labetalol bid   Hypothyroidism    takes Synthroid daily   Insomnia    hydroxyzine prn   Joint pain    Pneumonia    "couple times" (03/25/2017)   Pre-diabetes    "just found out 1 wk ago" (03/25/2017)   Psoriasis    elbows,knees,back   Shortness of breath    with exertion   Slowing of urinary stream    Stress incontinence     Past Surgical History:  Procedure Laterality Date   [place]   "left one of my ovaries"   BACK SURGERY     BIOPSY  05/27/2019   Procedure: BIOPSY;  Surgeon: Daneil Dolin, MD;  [facility];  Service: Endoscopy;;   COLONOSCOPY  2011   Dr. Ardis Hughs: Mild diverticulosis, descending diminutive colon polyp (not retrieved), next colonoscopy 10 years   CORONARY ANGIOPLASTY WITH STENT PLACEMENT  2011 X2   "regular stents didn't work; had to go back in in ~ 1 month and put in medicated stents"   DILATION AND CURETTAGE OF UTERUS     ESOPHAGOGASTRODUODENOSCOPY     ESOPHAGOGASTRODUODENOSCOPY (EGD) WITH PROPOFOL N/A 05/27/2019   normal esophagus, dilation, erosive gastropathy s/p biopsy, normal duodenum. Negative H.pylori.    LEFT HEART CATH AND CORONARY ANGIOGRAPHY N/A 03/26/2017   Procedure: LEFT HEART CATH AND CORONARY ANGIOGRAPHY;  Surgeon: Belva Crome,  MD;  Location: [facility];  Service: Cardiovascular;  Laterality: N/A;   LUMBAR LAMINECTOMY/DECOMPRESSION MICRODISCECTOMY  08/05/2011   Procedure: LUMBAR LAMINECTOMY/DECOMPRESSION MICRODISCECTOMY;  Surgeon: Otilio Connors, MD;  Location: [facility];  Service: Neurosurgery;  Laterality: Right;  Right Lumbar four-five extraforaminal discectomy   MALONEY DILATION N/A 05/27/2019   Procedure: Venia Minks DILATION;  Surgeon: Daneil Dolin, MD;  [facility];  Service: Endoscopy;  Laterality: N/A;  54   SHOULDER ARTHROSCOPY WITH ROTATOR CUFF REPAIR AND OPEN BICEPS TENODESIS Right 12/24/2021   Procedure: RIGHT SHOULDER ARTHROSCOPY WITH ROTATOR CUFF REPAIR AND BICEPS TENODESIS;  Surgeon: Marybelle Killings, MD;  Location: [facility];  Service: Orthopedics;  Laterality: Right;   TONSILLECTOMY     as a child   UPPER GASTROINTESTINAL ENDOSCOPY  Family Psychiatric History:  See intake H&P for full details. Reviewed, with no updates at this time.  Family History:  Family History  Problem Relation Age of Onset   Heart disease Mother    Hypertension Mother    Stroke Mother    Mental illness Mother    Heart disease Father    Hypertension Father    Diabetes Father    Colon polyps Brother    Breast cancer Maternal Aunt    Throat cancer Maternal Uncle    Thyroid disease Paternal Aunt    Heart disease Maternal Grandfather    Cancer Paternal Grandmother        [place]   Anesthesia problems Daughter    Hypotension Neg Hx    Malignant hyperthermia Neg Hx    Pseudochol deficiency Neg Hx    Colon cancer Neg Hx    Stomach cancer Neg Hx    Esophageal cancer Neg Hx    Pancreatic cancer Neg Hx    Rectal cancer Neg Hx     Social History:  Social History   Socioeconomic History   Marital status: Divorced    Spouse name: Not on file   Number of children: 1   Years of education: Not on file   Highest education level: 9th grade  Occupational History   Not on file  Tobacco Use   Smoking  status: Every Day    Packs/day: 0.50    Years: 50.00    Total pack years: 25.00    Types: Cigarettes   Smokeless tobacco: Never  Vaping Use   Vaping Use: Some days   Substances: Nicotine  Substance and Sexual Activity   Alcohol use: Yes    Comment: once a week   Drug use: Yes    Types: Marijuana    Comment: history of cocaine use, been about a year, per patient (12/18/21)   Sexual activity: Not Currently    Birth control/protection: Surgical  Other Topics Concern   Not on file  Social History Narrative   Not on file   Social Determinants of Health   Financial Resource Strain: Low Risk  (06/09/2018)   Overall Financial Resource Strain (CARDIA)    Difficulty of Paying Living Expenses: Not hard at all  Food Insecurity: Food Insecurity Present (12/20/2021)   Hunger Vital Sign    Worried About Running Out of Food in the Last Year: Sometimes true    Ran Out of Food in the Last Year: Sometimes true  Transportation Needs: Unmet Transportation Needs (12/20/2021)   PRAPARE - Hydrologist (Medical): Yes    Lack of Transportation (Non-Medical): No  Physical Activity: Inactive (12/20/2021)   Exercise Vital Sign    Days of Exercise per Week: 0 days    Minutes of Exercise per Session: 0 min  Stress: Stress Concern Present (12/20/2021)   [place]    Feeling of Stress : To some extent  Social Connections: Moderately Isolated (12/20/2021)   Social Connection and Isolation Panel [NHANES]    Frequency of Communication with Friends and Family: More than three times a week    Frequency of Social Gatherings with Friends and Family: More than three times a week    Attends Religious Services: 1 to 4 times per year    Active Member of [employer] or Organizations: No    Attends Archivist Meetings: Never    Marital Status: Divorced    Allergies:  Allergies  Allergen Reactions   Avocado Anaphylaxis    Latex Shortness Of Breath and Rash   Codeine Nausea Only    Metabolic Disorder Labs: Lab Results  Component Value Date   HGBA1C 5.3 12/18/2021   MPG 105.41 12/18/2021   MPG 119.76 10/05/2021   No results found for: "PROLACTIN" Lab Results  Component Value Date   CHOL 147 11/24/2020   TRIG 109 11/24/2020   HDL 58 11/24/2020   CHOLHDL 2.5 11/24/2020   VLDL 15 04/16/2016   LDLCALC 69 11/24/2020   LDLCALC 118 (H) 06/15/2020   Lab Results  Component Value Date   TSH 0.502 10/25/2021   TSH 5.450 (H) 09/14/2021    Therapeutic Level Labs: No results found for: "LITHIUM" No results found for: "VALPROATE" No results found for: "CBMZ"  Current Medications: Current Outpatient Medications  Medication Sig Dispense Refill   albuterol (VENTOLIN HFA) 108 (90 Base) MCG/ACT inhaler INHALE 2 PUFFS INTO THE LUNGS EVERY 4-6 HOURS AS NEEDED FOR WHEEZING OR SHORTNESS OF BREATH. 18 g 2   amLODipine (NORVASC) 5 MG tablet TAKE 1 TABLET (5 MG TOTAL) BY MOUTH DAILY. 90 tablet 0   aspirin EC 81 MG tablet Take 81 mg by mouth daily.     atorvastatin (LIPITOR) 80 MG tablet Take 1 tablet (80 mg total) by mouth daily. please schedule appointment for more refills 30 tablet 0   Blood Glucose Monitoring Suppl (TRUE METRIX METER) DEVI 1 kit by Does not apply route 4 (four) times daily. 1 Device 0   busPIRone (BUSPAR) 15 MG tablet TAKE 2 TABLETS IN THE MORNING, 1 TAB AT NOON AND 1 TAB IN THE EVENING 120 tablet 3   Chlorpheniramine-DM (COUGH & COLD HBP PO) Take 1 tablet by mouth daily as needed (congestion). (Patient not taking: Reported on 02/18/2022)     [START ON 03/15/2022] clonazePAM (KLONOPIN) 0.5 MG tablet Take 1 tablet (0.5 mg total) by mouth 2 (two) times daily as needed for anxiety. 60 tablet 1   doxepin (SINEQUAN) 75 MG capsule Take 1 capsule (75 mg total) by mouth at bedtime. 30 capsule 3   escitalopram (LEXAPRO) 10 MG tablet TAKE 3 TABLETS (30 MG TOTAL) BY MOUTH DAILY. 90 tablet 3   ferrous sulfate 325  (65 FE) MG tablet Take 1 tablet (325 mg total) by mouth daily with breakfast. 100 tablet 1   Fluticasone-Umeclidin-Vilant (TRELEGY ELLIPTA) 100-62.5-25 MCG/ACT AEPB Inhale 1 puff into the lungs daily 60 each 2   furosemide (LASIX) 20 MG tablet Take 1 tablet (20 mg total) by mouth daily. 30 tablet 2   gabapentin (NEURONTIN) 600 MG tablet TAKE 1 TABLET BY MOUTH 4 TIMES DAILY 360 tablet 1   glucose blood (TRUE METRIX BLOOD GLUCOSE TEST) test strip Use as instructed 100 each 12   ipratropium-albuterol (DUONEB) 0.5-2.5 (3) MG/3ML SOLN TAKE 3 MLS VIA NEBULIZATION EVERY 6 HOURS (Patient not taking: Reported on 02/18/2022) 360 mL 1   levothyroxine (SYNTHROID) 112 MCG tablet Take 1 tablet (112 mcg total) by mouth daily. 30 tablet 6   loperamide (IMODIUM A-D) 2 MG tablet Take 4 mg by mouth 4 (four) times daily as needed for diarrhea or loose stools. (Patient not taking: Reported on 02/18/2022)     losartan (COZAAR) 50 MG tablet TAKE 1 & 1/2 TABLETS BY MOUTH DAILY. 45 tablet 2   metFORMIN (GLUCOPHAGE) 500 MG tablet TAKE 1 TABLET (500 MG TOTAL) BY MOUTH 2 (TWO) TIMES DAILY WITH A MEAL. 60 tablet 3   methocarbamol (ROBAXIN) 500 MG  tablet Take 1 tablet (500 mg total) by mouth 2 (two) times daily as needed for muscle spasms. 60 tablet 1   mirtazapine (REMERON) 30 MG tablet TAKE 1 TABLET (30 MG TOTAL) BY MOUTH AT BEDTIME. 30 tablet 3   montelukast (SINGULAIR) 10 MG tablet TAKE 1 TABLET BY MOUTH AT BEDTIME. 30 tablet 2   nebivolol (BYSTOLIC) 10 MG tablet TAKE 1 TABLET (10 MG TOTAL) BY MOUTH DAILY. 90 tablet 1   nicotine (NICODERM CQ - DOSED IN MG/24 HOURS) 21 mg/24hr patch Place 1 patch (21 mg total) onto the skin daily. (Patient not taking: Reported on 02/18/2022) 28 patch 1   nicotine polacrilex (NICOTINE MINI) 4 MG lozenge Use three times a day as needed to stop smoking (Patient not taking: Reported on 02/18/2022) 100 tablet 0   nitroGLYCERIN (NITROSTAT) 0.4 MG SL tablet Place 1 tablet (0.4 mg total) under the tongue  every 5 (five) minutes as needed for chest pain. 25 tablet 3   omeprazole (PRILOSEC) 40 MG capsule TAKE 1 CAPSULE (40 MG TOTAL) BY MOUTH 2 (TWO) TIMES DAILY BEFORE A MEAL. 60 capsule 2   oxyCODONE-acetaminophen (PERCOCET/ROXICET) 5-325 MG tablet Take 1 tablet by mouth every 4 (four) hours as needed for severe pain. (Patient not taking: Reported on 02/18/2022) 50 tablet 0   sodium chloride (OCEAN) 0.65 % SOLN nasal spray Place 1 spray into both nostrils as needed for congestion.     ticagrelor (BRILINTA) 60 MG TABS tablet TAKE 1 TABLET (60 MG TOTAL) BY MOUTH 2 (TWO) TIMES DAILY. 60 tablet 5   traMADol (ULTRAM) 50 MG tablet Take 1 tablet (50 mg total) by mouth every 6 (six) hours as needed. 40 tablet 0   TRUEPLUS LANCETS 28G MISC 28 g by Does not apply route QID. 120 each 2   No current facility-administered medications for this visit.     Musculoskeletal: Strength & Muscle Tone: Unable to assess due to telemedicine visit [place]: Unable to assess due to telemedicine visit Patient leans: Unable to assess due to telemedicine visit  Psychiatric Specialty Exam: Review of Systems  Psychiatric/Behavioral:  Negative for decreased concentration, dysphoric mood, hallucinations, self-injury, sleep disturbance and suicidal ideas. The patient is not nervous/anxious and is not hyperactive.     There were no vitals taken for this visit.There is no height or weight on file to calculate BMI.  General Appearance: Unable to assess due to telemedicine visit  Eye Contact:  Unable to assess due to telemedicine visit  Speech:  Clear and Coherent and Normal Rate  Volume:  Normal  Mood:  Euthymic  Affect:  Appropriate  Thought Process:  Coherent, Goal Directed, and Descriptions of Associations: Intact  Orientation:  Full (Time, Place, and Person)  Thought Content: WDL   Suicidal Thoughts:  No  Homicidal Thoughts:  No  Memory:  Immediate;   Good Recent;   Good Remote;   Good  Judgement:  Fair   Insight:  Fair  Psychomotor Activity:  Normal  Concentration:  Concentration: Good and Attention Span: Good  Recall:  [employer] of Knowledge: Fair  Language: Fair  Akathisia:  Negative  Handed:  Right  AIMS (if indicated): not done  Assets:  Communication Skills Desire for Improvement Housing  ADL's:  Intact  Cognition: WNL  Sleep:  Fair   Screenings: AUDIT    Flowsheet Row Admission (Discharged) from 07/10/2013 in [facility]  Alcohol Use Disorder Identification Test Final Score (AUDIT) 12  GAD-7    Flowsheet Row [facility] Video Visit from [facility]/29/2021 in [facility] Office Visit from 10/25/2021 in [place] Video Visit from 08/30/2021 in [facility] Office Visit from 05/31/2021 in [facility]  Total GAD-7 Score 3 7 0 6 21      PHQ2-9    Flowsheet Row Video Visit from 02/28/2022 in [place] from 12/20/2021 in [place] Video Visit from 11/29/2021 in [facility] Office Visit from 10/25/2021 in [place] Office Visit from 09/14/2021 in [place]  PHQ-2 Total Score 0 0 0 0 0      Flowsheet Row Video Visit from 02/28/2022 in [facility] Admission (Discharged) from 12/24/2021 in [place] [facility] from 12/18/2021 in [facility] PREADMISSION TESTING  C-SSRS RISK CATEGORY No Risk No Risk No Risk        Assessment and Plan:   Sabryna Lahm. Teegarden is a 66 year old female with a past psychiatric history significant for insomnia, generalized anxiety disorder, and major depressive disorder who presents to [facility] via virtual telephone visit for follow-up and medication management.  Patient reports no issues or concerns regarding her current medication regimen.  Patient denies depression but does endorse occasional anxiety and panic attacks.  Patient's anxiety appears to be well-managed on her current medication.  Patient's medications to be prescribed to pharmacy of choice.  Collaboration of Care: Collaboration of Care: Medication Management AEB patient's medications being managed by this provider and Psychiatrist AEB patient being managed by this behavioral health provider  Patient/Guardian was advised Release of Information must be obtained prior to any record release in order to collaborate their care with an outside provider. Patient/Guardian was advised if they have not already done so to contact the registration department to sign all necessary forms in order for [place] to release information regarding their care.   Consent: Patient/Guardian gives verbal consent for treatment and assignment of benefits for services provided during this visit. Patient/Guardian expressed understanding and agreed to proceed.   1. Major depressive disorder, recurrent, in full remission with anxious distress (HCC)  - doxepin (SINEQUAN) 75 MG capsule; Take 1 capsule (75 mg total) by mouth at bedtime.  Dispense: 30 capsule; Refill: 3 - escitalopram (LEXAPRO) 10 MG tablet; TAKE 3 TABLETS (30 MG TOTAL) BY MOUTH DAILY.  Dispense: 90 tablet; Refill: 3 - busPIRone (BUSPAR) 15 MG tablet; TAKE 2 TABLETS IN THE MORNING, 1 TAB AT NOON AND 1 TAB IN THE EVENING  Dispense: 120 tablet; Refill: 3  2. GAD (generalized anxiety disorder)  - escitalopram (LEXAPRO) 10 MG tablet; TAKE 3 TABLETS (30 MG TOTAL) BY MOUTH DAILY.  Dispense: 90 tablet; Refill: 3 - busPIRone (BUSPAR) 15 MG tablet; TAKE 2 TABLETS IN THE MORNING, 1 TAB AT NOON AND 1 TAB IN THE EVENING  Dispense: 120 tablet; Refill: 3 - clonazePAM (KLONOPIN) 0.5 MG tablet; Take 1  tablet (0.5 mg total) by mouth 2 (two) times daily as needed for anxiety.  Dispense: 60 tablet; Refill: 1  3. Insomnia, unspecified type  - mirtazapine (REMERON) 30 MG tablet; TAKE 1 TABLET (30 MG TOTAL) BY MOUTH AT BEDTIME.  Dispense: 30 tablet; Refill: 3  Patient to follow up in 3 months Provider  spent a total of 10 minutes with the patient/reviewing patient's chart  Malachy Mood, PA 02/28/2022, 11:27 PM

## 2022-03-01 ENCOUNTER — Other Ambulatory Visit: Payer: Self-pay

## 2022-03-06 ENCOUNTER — Ambulatory Visit (INDEPENDENT_AMBULATORY_CARE_PROVIDER_SITE_OTHER): Payer: Medicare Other | Admitting: Rehabilitative and Restorative Service Providers"

## 2022-03-06 ENCOUNTER — Encounter: Payer: Self-pay | Admitting: Rehabilitative and Restorative Service Providers"

## 2022-03-06 DIAGNOSIS — M25611 Stiffness of right shoulder, not elsewhere classified: Secondary | ICD-10-CM

## 2022-03-06 DIAGNOSIS — M25511 Pain in right shoulder: Secondary | ICD-10-CM

## 2022-03-06 DIAGNOSIS — R600 Localized edema: Secondary | ICD-10-CM | POA: Diagnosis not present

## 2022-03-06 DIAGNOSIS — M6281 Muscle weakness (generalized): Secondary | ICD-10-CM | POA: Diagnosis not present

## 2022-03-06 DIAGNOSIS — G8929 Other chronic pain: Secondary | ICD-10-CM

## 2022-03-06 NOTE — Therapy (Signed)
OUTPATIENT PHYSICAL THERAPY TREATMENT NOTE    Patient Name: [name] [name] MRN: [id number] DOB:[birth year], 66 y.o., female Today's Date: 03/06/2022   END OF SESSION:   PT End of Session - 03/06/22 1145     Visit Number 19    Number of Visits 20    Date for PT Re-Evaluation 03/06/22    Authorization Type Medicare - kx after 15    Progress Note Due on Visit 20    PT Start Time 1140    PT Stop Time 1219    PT Time Calculation (min) 39 min    Activity Tolerance Patient tolerated treatment well   pain in mobility   Behavior During Therapy WFL for tasks assessed/performed                  Past Medical History:  Diagnosis Date   Allergy    seasonal   Anemia    Anxiety    takes Lexapro daily   Arthritis    "back, from neck down pass my bra area" (03/25/2017)   Asthma    Bartholin gland cyst 08/29/2011   Bruises easily    pt is on Effient   Chronic back pain    herniated nucleus pulposus   Chronic back pain    "neck to bra area; lower back" (03/25/2017)   Chronic kidney disease    recurrent UTI's this year 2022   COPD (chronic obstructive pulmonary disease) (HCC)    early stages   Coronary artery disease    Depression    takes Klonopin daily   Diabetes mellitus without complication ([place])    Diverticulosis    Fibroadenoma of left breast    GERD (gastroesophageal reflux disease)    takes Nexium daily   H/O hiatal hernia    Heart attack ([place]) 2011   Hemorrhoids    Hernia    Hyperlipidemia    takes Lipitor daily   Hypertension    takes Losartan daily and Labetalol bid   Hypothyroidism    takes Synthroid daily   Insomnia    hydroxyzine prn   Joint pain    Pneumonia    "couple times" (03/25/2017)   Pre-diabetes    "just found out 1 wk ago" (03/25/2017)   Psoriasis    elbows,knees,back   Shortness of breath    with exertion   Slowing of urinary stream    Stress incontinence    Past Surgical History:  Procedure Laterality Date   Tees Toh   "left one of my ovaries"   BACK SURGERY     BIOPSY  05/27/2019   Procedure: BIOPSY;  Surgeon: Daneil Dolin, MD;  Location: [facility];  Service: Endoscopy;;   COLONOSCOPY  2011   Dr. Ardis Hughs: Mild diverticulosis, descending diminutive colon polyp (not retrieved), next colonoscopy 10 years   CORONARY ANGIOPLASTY WITH STENT PLACEMENT  2011 X2   "regular stents didn't work; had to go back in in ~ 1 month and put in medicated stents"   DILATION AND CURETTAGE OF UTERUS     ESOPHAGOGASTRODUODENOSCOPY     ESOPHAGOGASTRODUODENOSCOPY (EGD) WITH PROPOFOL N/A 05/27/2019   normal esophagus, dilation, erosive gastropathy s/p biopsy, normal duodenum. Negative H.pylori.    LEFT HEART CATH AND CORONARY ANGIOGRAPHY N/A 03/26/2017   Procedure: LEFT HEART CATH AND CORONARY ANGIOGRAPHY;  Surgeon: Belva Crome, MD;  Location: [facility];  Service: Cardiovascular;  Laterality: N/A;   LUMBAR  LAMINECTOMY/DECOMPRESSION MICRODISCECTOMY  08/05/2011   Procedure: LUMBAR LAMINECTOMY/DECOMPRESSION MICRODISCECTOMY;  Surgeon: Otilio Connors, MD;  Location: [facility];  Service: Neurosurgery;  Laterality: Right;  Right Lumbar four-five extraforaminal discectomy   MALONEY DILATION N/A 05/27/2019   Procedure: Venia Minks DILATION;  Surgeon: Daneil Dolin, MD;  Location: [facility];  Service: Endoscopy;  Laterality: N/A;  54   SHOULDER ARTHROSCOPY WITH ROTATOR CUFF REPAIR AND OPEN BICEPS TENODESIS Right 12/24/2021   Procedure: RIGHT SHOULDER ARTHROSCOPY WITH ROTATOR CUFF REPAIR AND BICEPS TENODESIS;  Surgeon: Marybelle Killings, MD;  Location: [facility];  Service: Orthopedics;  Laterality: Right;   TONSILLECTOMY     as a child   UPPER GASTROINTESTINAL ENDOSCOPY     Patient Active Problem List   Diagnosis Date Noted   Nontraumatic complete tear of right rotator cuff    Superior glenoid labrum lesion of left shoulder    Iron deficiency anemia 11/02/2021   Major  depressive disorder, recurrent, in full remission with anxious distress ([place]) 05/31/2021   Rotator cuff tendinitis, right 05/08/2021   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   Chronic neck pain 02/26/2021   GAD (generalized anxiety disorder) 01/08/2021   Acute cystitis without hematuria 11/07/2020   Influenza vaccine needed 06/15/2020   Abdominal pain, epigastric 04/28/2019   Esophageal dysphagia 04/28/2019   Bipolar I disorder ([place]) 09/17/2018   Insomnia 10/28/2017   Prediabetes 05/28/2017   QT prolongation 03/25/2017   Encounter for screening mammogram for breast cancer 09/11/2015   Psoriasis 06/22/2015   COPD (chronic obstructive pulmonary disease) (HCC)GOLD E 05/18/2015   Anxiety and depression 07/10/2013   Essential hypertension, benign 06/07/2013   Dyslipidemia 06/07/2013   Asthma, chronic 06/07/2013   Emphysema lung ([place]) 06/07/2013   Chronic back pain 06/07/2013   CAD S/P percutaneous coronary angioplasty 06/07/2013   GERD (gastroesophageal reflux disease) 06/07/2013   Breast lump on left side at 3 o'clock position 10/13/2012   S/P abdominal hysterectomy and right salpingo-oophorectomy 08/29/2011   TOBACCO ABUSE 07/17/2009   Chronic rhinitis 07/17/2009   Lung nodule < 6cm on CT 07/17/2009   ALLERGY, FOOD 07/17/2009   Hypothyroidism 12/22/2008     THERAPY DIAG:  Chronic right shoulder pain  Muscle weakness (generalized)  Stiffness of right shoulder, not elsewhere classified  Localized edema  PCP: Troy Sine MD   REFERRING PROVIDER: Marybelle Killings, MD   REFERRING DIAG: M75.81 (ICD-10-CM) - Rotator cuff tendinitis, right   Rationale for Evaluation and Treatment Rehabilitation   ONSET DATE: Surgery 12/24/2021   SUBJECTIVE:  SUBJECTIVE STATEMENT: Pt indicated feeling a  little tight with achy this week.  Reported up to 5/10.  Not specific to any activity.   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    OBJECTIVE:    PATIENT SURVEYS:  02/13/2022:  FOTO update:   48  12/26/2021: FOTO intake:  20  predicted:  52   POSTURE: 12/26/2021 Rounded shoulder, forward head posture.  Rt arm in sling upon arrival              Localized edema Rt shoulder visibly    UPPER EXTREMITY ROM:    ROM Right 12/26/2021 Passive in supine - All limited by pain Right 01/02/2022  PROM - limited by pain response Right 01/09/2022  PROM supine PROM Right 01/11/2022 assessed supine 01/18/2022 PROM in supine 01/25/2022: PROM in supine 02/01/2022: AAROM  Shoulder flexion 10 100 in supine 125 in spuine  115 supine 125 supine 125 supine 145 supine  Shoulder extension          Shoulder abduction 5 90 in supine    99 in supine   Shoulder adduction          Shoulder internal rotation To belly With arm at side   55 degrees assessed at 70 degrees abduction     Shoulder external rotation -20 With arm at side 0 in 30 deg abduction  42 in 30 deg abduction supine 50 degrees assessed at 70 degrees abduction 53 degrees assessed at 70 degrees abduction 50 degrees assessed at 60 degrees abduction   Elbow flexion WFL passively        Elbow extension -20 deg c pain in shoulder passively        (Blank rows = not tested)   UPPER EXTREMITY MMT:                          12/26/2021:  not tested due to protocol for surgery - Rt shoulder MMT Right 02/08/2022   Right 02/20/2022   Right 02/27/2022 Right 03/06/2022  Shoulder flexion  2+/5  3/5 - shrug noted against gravity 3+/5 c shrug noted 3+/5 c shrug against gravity  Shoulder extension        Shoulder abduction        Shoulder adduction        Shoulder internal rotation     5/5   Shoulder external rotation     4/5   Middle trapezius        Lower trapezius        Elbow flexion        Elbow extension        Wrist flexion        Wrist extension        Wrist ulnar deviation        Wrist radial deviation        Wrist pronation        Wrist supination  Grip strength (lbs)        (Blank rows = not tested)   SHOULDER SPECIAL TESTS:             12/26/2021: none tested today   JOINT MOBILITY TESTING:  12/26/2021 heavy muscle guarding and pain limited full assessment for Rt shoulder.    PALPATION:  12/26/2021 Tenderness throughout Rt shoulder region in muscles and joint, incision sites.               TODAY'S TREATMENT:  03/06/2022 Therex:  Pulleys flexion, scaption c eccentric lowering focus Rt arm 3 mins each way   Supine in 20 deg bed elevation - Rt shoulder flexion 2 lbs 2 x 10  Supine 90 deg flexion circles small cw, ccw x 20 each 3 lbs   Sidelying Rt shoulder abduction 1 lb 2 x 20  Sidelying Rt shoulder ER 2 lbs x20, x 15  Standing Rt shoulder 90 degrees to 0 eccentric lowering only 2 x 10  Seated robbery ex (scap retraction c bilateral ER) x15 green band    Cross arm stretch 15 sec x 5 Rt arm  UBE AAROM fwd/back 2 mins each way lvl 2.5   02/27/2022 Therex:  UBE AAROM fwd/back 3 mins each way lvl 2.5 Sitting AAROM flexion 1 lb bar c eccentric Rt arm lowering focus 2 x 10  Seated robbery ex (scap retraction c bilateral ER) 2 x 10 green band   Supine horizontal abduction band pull bilateral 2 x 10 green band  Supine 90 deg circles cw, ccw x 20 each 2 lb weight Supine 100 deg  protraction Rt shoulder 2 lbs 3 sec hold x 10   Sidelying Rt shoulder abduction 1 lb 2 x 15 Standing green band rows, gh ext x 20 each bilaterally   02/20/2022 Therex:  UBE AAROM fwd/back 3 mins each way lvl 2.5   Standing UE ranger flexion, scaption AAROM x 15 each  Standing assisted flexion to 100 deg , eccentric lowering Rt arm only x 10    Seated pulley flexion, scaption c focus on outstretched arm assisted lifting and eccentric control lower with Rt arm 3 mins each  Standing wall push up c SA press x 10 (challenging)  Supine horizontal abduction band pull bilateral 2 x 10  Supine protraction hold 3 seconds in 110 deg flexion x 10 1 lb weight with visual reaching target Seated Scapular retraction c bilateral ER red band 2 x 10  PATIENT EDUCATION: 02/27/2022 Education details: HEP progression Person educated: Patient Education method: Consulting civil engineer, Media planner, Verbal cues, and Handouts Education comprehension: verbalized understanding, returned demonstration, and verbal cues required     HOME EXERCISE PROGRAM: Access Code: [email or web address] URL: [email or web address] Date: 02/27/2022 Prepared by: Faustino Congress   Exercises - Seated Scapular Retraction  - 3-5 x daily - 7 x weekly - 1 sets - 10 reps - 3-5 hold - Supine Shoulder Flexion Extension AAROM with Dowel  - 2-3 x daily - 7 x weekly - 1-2 sets - 10-15 reps - 3 hold - Supine Shoulder External Rotation with Dowel (Mirrored)  - 2-3 x daily - 7 x weekly - 1 sets - 10 reps - 5 hold - Supine Shoulder Flexion Extension Full Range AROM  - 1-2 x daily - 7 x weekly - 2-3 sets - 10-15 reps - Sidelying Shoulder Abduction Palm Forward  - 1-2 x daily - 7 x weekly - 2-3 sets - 10-15 reps - Shoulder External Rotation and Scapular Retraction with Resistance  -  3-5 x daily - 7 x weekly - 1-2 sets - 10 reps - Standing Shoulder Flexion AAROM with Dowel  - 1-2 x daily - 7 x weekly - 2 sets - 10 reps - Sidelying Shoulder Flexion  15 Degrees (Mirrored)  - 1-2 x daily - 7 x weekly - 3 sets - 10 reps   ASSESSMENT:   CLINICAL IMPRESSION: Presentation of continued Rt shoulder pain c strength deficit primary at this time created medical necessity for continued skilled PT services at this time to help improve functional activity ability of daily life.  Making slow but steady progression in gravity reduced strengthening (shrug noted against gravity).  Continued presence of fatigue and difficulty with arm lifting against gravity.    OBJECTIVE IMPAIRMENTS decreased activity tolerance, decreased coordination, decreased endurance, decreased mobility, decreased ROM, decreased strength, hypomobility, increased edema, increased fascial restrictions, impaired perceived functional ability, impaired flexibility, impaired UE functional use, improper body mechanics, and pain.    ACTIVITY LIMITATIONS carrying, lifting, bed mobility, bathing, toileting, dressing, reach over head, hygiene/grooming, and locomotion level   PARTICIPATION LIMITATIONS: meal prep, cleaning, laundry, interpersonal relationship, community activity, occupation, and yard work   PERSONAL FACTORS  Chronic back pain, CKD, COPD, CAD, depression, DM, GERD  are also affecting patient's functional outcome.    REHAB POTENTIAL: Good   CLINICAL DECISION MAKING: Stable/uncomplicated   EVALUATION COMPLEXITY: Low     GOALS: Goals reviewed with patient? Yes   Short term PT Goals (target date for Short term goals are 3 weeks 01/16/2022) Patient will demonstrate independent use of home exercise program to maintain progress from in clinic treatments. Goal status: MET 01/16/22   Long term PT goals (target dates for all long term goals are 10 weeks  03/06/2022 )   1. Patient will demonstrate/report pain at worst less than or equal to 2/10 to facilitate minimal limitation in daily activity secondary to pain symptoms. Goal status: on going - assessed 02/27/2022   2. Patient will  demonstrate independent use of home exercise program to facilitate ability to maintain/progress functional gains from skilled physical therapy services. Goal status: on going - assessed 02/27/2022   3. Patient will demonstrate FOTO outcome > or = 52 % to indicate reduced disability due to condition. Goal status: on going - assessed 02/27/2022   4.  Patient will demonstrate Rt [place] joint mobility WFL to facilitate usual self care, dressing, reaching overhead at PLOF s limitation due to symptoms.  Goal status: on going - assessed 02/27/2022   5.  Patient will demonstrate Rt UE MMT 4/5 or greater throughout to facilitate usual lifting, carrying in functional activity to PLOF s limitation.   Goal status: on going - assessed 02/27/2022   6.  Patient will demonstrate ability to sleep s restriction due to symptom.  Goal status: on going - assessed 02/27/2022     PLAN: PT FREQUENCY:  2-3x/week , then 1-2x/week as able   PT DURATION: 10 weeks   PLANNED INTERVENTIONS: Therapeutic exercises, Therapeutic activity, Neuro Muscular re-education, Balance training, Gait training, Patient/Family education, Joint mobilization, Stair training, DME instructions, Dry Needling, Electrical stimulation, Cryotherapy, Moist heat, Taping, Ultrasound, Ionotophoresis 60m/ml Dexamethasone, and Manual therapy.  All included unless contraindicated   PLAN FOR NEXT SESSION: Re-certification next visit (for 4 weeks, 1x week plan)  MScot Jun PT, DPT, OCS, ATC 03/06/22  12:15 PM

## 2022-03-08 ENCOUNTER — Ambulatory Visit (INDEPENDENT_AMBULATORY_CARE_PROVIDER_SITE_OTHER): Payer: Medicare Other | Admitting: Rehabilitative and Restorative Service Providers"

## 2022-03-08 ENCOUNTER — Encounter: Payer: Self-pay | Admitting: Rehabilitative and Restorative Service Providers"

## 2022-03-08 DIAGNOSIS — M6281 Muscle weakness (generalized): Secondary | ICD-10-CM | POA: Diagnosis not present

## 2022-03-08 DIAGNOSIS — G8929 Other chronic pain: Secondary | ICD-10-CM

## 2022-03-08 DIAGNOSIS — R600 Localized edema: Secondary | ICD-10-CM

## 2022-03-08 DIAGNOSIS — M25611 Stiffness of right shoulder, not elsewhere classified: Secondary | ICD-10-CM

## 2022-03-08 DIAGNOSIS — M25511 Pain in right shoulder: Secondary | ICD-10-CM | POA: Diagnosis not present

## 2022-03-08 NOTE — Therapy (Signed)
OUTPATIENT PHYSICAL THERAPY TREATMENT NOTE & PROGRESS NOTE /RECERT   Patient Name: [name] [name] MRN: [id number] DOB:1956/06/04, 66 y.o., female Today's Date: 03/08/2022  Progress Note Reporting Period 01/30/2022 to 03/08/2022  See note below for Objective Data and Assessment of Progress/Goals.     END OF SESSION:   PT End of Session - 03/08/22 1129     Visit Number 20    Number of Visits 24    Date for PT Re-Evaluation 04/05/22    Authorization Type Medicare - kx after 15    Progress Note Due on Visit 30    PT Start Time 1121    PT Stop Time 1206    PT Time Calculation (min) 45 min    Activity Tolerance Patient tolerated treatment well   pain in mobility   Behavior During Therapy WFL for tasks assessed/performed             Past Medical History:  Diagnosis Date   Allergy    seasonal   Anemia    Anxiety    takes Lexapro daily   Arthritis    "back, from neck down pass my bra area" (03/25/2017)   Asthma    Bartholin gland cyst 08/29/2011   Bruises easily    pt is on Effient   Chronic back pain    herniated nucleus pulposus   Chronic back pain    "neck to bra area; lower back" (03/25/2017)   Chronic kidney disease    recurrent UTI's this year 2022   COPD (chronic obstructive pulmonary disease) (HCC)    early stages   Coronary artery disease    Depression    takes Klonopin daily   Diabetes mellitus without complication ([place])    Diverticulosis    Fibroadenoma of left breast    GERD (gastroesophageal reflux disease)    takes Nexium daily   H/O hiatal hernia    Heart attack ([place]) 2011   Hemorrhoids    Hernia    Hyperlipidemia    takes Lipitor daily   Hypertension    takes Losartan daily and Labetalol bid   Hypothyroidism    takes Synthroid daily   Insomnia    hydroxyzine prn   Joint pain    Pneumonia    "couple times" (03/25/2017)   Pre-diabetes    "just found out 1 wk ago" (03/25/2017)   Psoriasis    elbows,knees,back   Shortness of breath     with exertion   Slowing of urinary stream    Stress incontinence    Past Surgical History:  Procedure Laterality Date   Louise   "left one of my ovaries"   BACK SURGERY     BIOPSY  05/27/2019   Procedure: BIOPSY;  Surgeon: Daneil Dolin, MD;  Location: [facility];  Service: Endoscopy;;   COLONOSCOPY  2011   Dr. Ardis Hughs: Mild diverticulosis, descending diminutive colon polyp (not retrieved), next colonoscopy 10 years   CORONARY ANGIOPLASTY WITH STENT PLACEMENT  2011 X2   "regular stents didn't work; had to go back in in ~ 1 month and put in medicated stents"   DILATION AND CURETTAGE OF UTERUS     ESOPHAGOGASTRODUODENOSCOPY     ESOPHAGOGASTRODUODENOSCOPY (EGD) WITH PROPOFOL N/A 05/27/2019   normal esophagus, dilation, erosive gastropathy s/p biopsy, normal duodenum. Negative H.pylori.    LEFT HEART CATH AND CORONARY ANGIOGRAPHY N/A 03/26/2017   Procedure: LEFT HEART CATH AND CORONARY ANGIOGRAPHY;  Surgeon:  Belva Crome, MD;  Location: [facility];  Service: Cardiovascular;  Laterality: N/A;   LUMBAR LAMINECTOMY/DECOMPRESSION MICRODISCECTOMY  08/05/2011   Procedure: LUMBAR LAMINECTOMY/DECOMPRESSION MICRODISCECTOMY;  Surgeon: Otilio Connors, MD;  Location: [facility];  Service: Neurosurgery;  Laterality: Right;  Right Lumbar four-five extraforaminal discectomy   MALONEY DILATION N/A 05/27/2019   Procedure: Venia Minks DILATION;  Surgeon: Daneil Dolin, MD;  Location: [facility];  Service: Endoscopy;  Laterality: N/A;  54   SHOULDER ARTHROSCOPY WITH ROTATOR CUFF REPAIR AND OPEN BICEPS TENODESIS Right 12/24/2021   Procedure: RIGHT SHOULDER ARTHROSCOPY WITH ROTATOR CUFF REPAIR AND BICEPS TENODESIS;  Surgeon: Marybelle Killings, MD;  Location: [facility];  Service: Orthopedics;  Laterality: Right;   TONSILLECTOMY     as a child   UPPER GASTROINTESTINAL ENDOSCOPY     Patient Active Problem List   Diagnosis Date Noted    Nontraumatic complete tear of right rotator cuff    Superior glenoid labrum lesion of left shoulder    Iron deficiency anemia 11/02/2021   Major depressive disorder, recurrent, in full remission with anxious distress ([place]) 05/31/2021   Rotator cuff tendinitis, right 05/08/2021   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   Chronic neck pain 02/26/2021   GAD (generalized anxiety disorder) 01/08/2021   Acute cystitis without hematuria 11/07/2020   Influenza vaccine needed 06/15/2020   Abdominal pain, epigastric 04/28/2019   Esophageal dysphagia 04/28/2019   Bipolar I disorder ([place]) 09/17/2018   Insomnia 10/28/2017   Prediabetes 05/28/2017   QT prolongation 03/25/2017   Encounter for screening mammogram for breast cancer 09/11/2015   Psoriasis 06/22/2015   COPD (chronic obstructive pulmonary disease) (HCC)GOLD E 05/18/2015   Anxiety and depression 07/10/2013   Essential hypertension, benign 06/07/2013   Dyslipidemia 06/07/2013   Asthma, chronic 06/07/2013   Emphysema lung ([place]) 06/07/2013   Chronic back pain 06/07/2013   CAD S/P percutaneous coronary angioplasty 06/07/2013   GERD (gastroesophageal reflux disease) 06/07/2013   Breast lump on left side at 3 o'clock position 10/13/2012   S/P abdominal hysterectomy and right salpingo-oophorectomy 08/29/2011   TOBACCO ABUSE 07/17/2009   Chronic rhinitis 07/17/2009   Lung nodule < 6cm on CT 07/17/2009   ALLERGY, FOOD 07/17/2009   Hypothyroidism 12/22/2008     THERAPY DIAG:  Chronic right shoulder pain  Muscle weakness (generalized)  Stiffness of right shoulder, not elsewhere classified  Localized edema  PCP: Troy Sine MD   REFERRING PROVIDER: Marybelle Killings, MD   REFERRING DIAG: M75.81 (ICD-10-CM) - Rotator cuff tendinitis, right   Rationale for Evaluation and Treatment Rehabilitation   ONSET DATE: Surgery 12/24/2021   SUBJECTIVE:  SUBJECTIVE STATEMENT: She is feeling increased soreness, tightness, and heaviness today, since Wednesday's session. She has been doing the exercises at home and using the pulleys 1-2x/day.  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    OBJECTIVE:    PATIENT SURVEYS:  02/13/2022:  FOTO update:   48  12/26/2021: FOTO intake:  20  predicted:  52   POSTURE: 12/26/2021 Rounded shoulder, forward head posture.  Rt arm in sling upon arrival              Localized edema Rt shoulder visibly    UPPER EXTREMITY ROM:    ROM Right 12/26/2021 Passive in supine - All limited by pain Right 01/02/2022  PROM - limited by pain response Right 01/09/2022  PROM supine PROM Right 01/11/2022 assessed supine 01/18/2022 PROM in supine 01/25/2022: PROM in supine 02/01/2022: AAROM 03/08/2022: AROM  Shoulder flexion 10 100 in supine 125 in spuine  115 supine 125 supine 125 supine 145 supine 160 supine  Shoulder extension           Shoulder abduction 5 90 in supine    99 in supine  128 supine  Shoulder adduction           Shoulder internal rotation To belly With arm at side   55 degrees assessed at 70 degrees abduction    64 degrees assessed at 70 degrees abduction  Shoulder external rotation -20 With arm at side 0 in 30 deg abduction  42 in 30  deg abduction supine 50 degrees assessed at 70 degrees abduction 53 degrees assessed at 70 degrees abduction 50 degrees assessed at 60 degrees abduction  55 degrees assessed at 70 degrees abduction  Elbow flexion WFL passively         Elbow extension -20 deg c pain in shoulder passively         (Blank rows = not tested)   UPPER EXTREMITY MMT:                         12/26/2021:  not tested due to protocol for surgery - Rt shoulder MMT Right 02/08/2022   Right 02/20/2022   Right 02/27/2022 Right 03/06/2022  Shoulder flexion  2+/5  3/5 - shrug noted against gravity 3+/5 c shrug noted 3+/5 c shrug against gravity  Shoulder extension        Shoulder abduction        Shoulder adduction        Shoulder internal rotation     5/5   Shoulder external rotation     4/5   Middle trapezius        Lower trapezius        Elbow flexion        Elbow extension        Wrist flexion        Wrist extension        Wrist ulnar deviation  Wrist radial deviation        Wrist pronation        Wrist supination        Grip strength (lbs)        (Blank rows = not tested)   SHOULDER SPECIAL TESTS:             12/26/2021: none tested today   JOINT MOBILITY TESTING:  12/26/2021 heavy muscle guarding and pain limited full assessment for Rt shoulder.    PALPATION:  12/26/2021 Tenderness throughout Rt shoulder region in muscles and joint, incision sites.               TODAY'S TREATMENT:  03/08/2022 Therex:  Dwaine Deter fwd/back 3 mins each way lvl 2.5 Pulleys flexion, scaption c eccentric lowering focus Rt arm 3 mins each way  Seated Rt shoulder 90 degrees to 0 eccentric lowering only x 15, x 15 with 1lb weight Supine horizontal abduction band pull bilateral 2 x 10 green band  Supine AROM ER and ER x 5 with tactile cueing for form Standing IR AAROM stretch behind back x 10 with 5sec hold PT education for home exercise pacing and progression to limit sx aggravation with appropriate strengthening, pt  verbalized understanding  03/06/2022 Therex:  Pulleys flexion, scaption c eccentric lowering focus Rt arm 3 mins each way   Supine in 20 deg bed elevation - Rt shoulder flexion 2 lbs 2 x 10  Supine 90 deg flexion circles small cw, ccw x 20 each 3 lbs   Sidelying Rt shoulder abduction 1 lb 2 x 20  Sidelying Rt shoulder ER 2 lbs x20, x 15  Standing Rt shoulder 90 degrees to 0 eccentric lowering only 2 x 10  Seated robbery ex (scap retraction c bilateral ER) x15 green band    Cross arm stretch 15 sec x 5 Rt arm  UBE AAROM fwd/back 2 mins each way lvl 2.5   02/27/2022 Therex:  UBE AAROM fwd/back 3 mins each way lvl 2.5 Sitting AAROM flexion 1 lb bar c eccentric Rt arm lowering focus 2 x 10  Seated robbery ex (scap retraction c bilateral ER) 2 x 10 green band   Supine horizontal abduction band pull bilateral 2 x 10 green band  Supine 90 deg circles cw, ccw x 20 each 2 lb weight Supine 100 deg protraction Rt shoulder 2 lbs 3 sec hold x 10   Sidelying Rt shoulder abduction 1 lb 2 x 15 Standing green band rows, gh ext x 20 each bilaterally   PATIENT EDUCATION: 02/27/2022 Education details: HEP progression Person educated: Patient Education method: Consulting civil engineer, Media planner, Verbal cues, and Handouts Education comprehension: verbalized understanding, returned demonstration, and verbal cues required     HOME EXERCISE PROGRAM: Access Code: [email or web address] URL: [email or web address] Date: 02/27/2022 Prepared by: Faustino Congress   Exercises - Seated Scapular Retraction  - 3-5 x daily - 7 x weekly - 1 sets - 10 reps - 3-5 hold - Supine Shoulder Flexion Extension AAROM with Dowel  - 2-3 x daily - 7 x weekly - 1-2 sets - 10-15 reps - 3 hold - Supine Shoulder External Rotation with Dowel (Mirrored)  - 2-3 x daily - 7 x weekly - 1 sets - 10 reps - 5 hold - Supine Shoulder Flexion Extension Full Range AROM  - 1-2 x daily - 7 x weekly - 2-3 sets - 10-15 reps - Sidelying  Shoulder Abduction Palm Forward  - 1-2 x daily - 7 x weekly - 2-3 sets -  10-15 reps - Shoulder External Rotation and Scapular Retraction with Resistance  - 3-5 x daily - 7 x weekly - 1-2 sets - 10 reps - Standing Shoulder Flexion AAROM with Dowel  - 1-2 x daily - 7 x weekly - 2 sets - 10 reps - Sidelying Shoulder Flexion 15 Degrees (Mirrored)  - 1-2 x daily - 7 x weekly - 3 sets - 10 reps   ASSESSMENT:   CLINICAL IMPRESSION: In this 10 visit progress note period, she has made steady progress in her shoulder mobility and functional abilities as she has been able to start active movement and strengthening. She remains limited currently by both Rt shoulder pain with activity and reduced strength & endurance for shoulder motions, continuing to demonstrate compensatory shrugging during movement against gravity. These deficits indicate medical necessity for the continuation of skilled PT services to address her functional limitations in ADLs, including self care and household chores.   OBJECTIVE IMPAIRMENTS decreased activity tolerance, decreased coordination, decreased endurance, decreased mobility, decreased ROM, decreased strength, hypomobility, increased edema, increased fascial restrictions, impaired perceived functional ability, impaired flexibility, impaired UE functional use, improper body mechanics, and pain.    ACTIVITY LIMITATIONS carrying, lifting, bed mobility, bathing, toileting, dressing, reach over head, hygiene/grooming, and locomotion level   PARTICIPATION LIMITATIONS: meal prep, cleaning, laundry, interpersonal relationship, community activity, occupation, and yard work   PERSONAL FACTORS  Chronic back pain, CKD, COPD, CAD, depression, DM, GERD  are also affecting patient's functional outcome.    REHAB POTENTIAL: Good   CLINICAL DECISION MAKING: Stable/uncomplicated   EVALUATION COMPLEXITY: Low     GOALS: Goals reviewed with patient? Yes   Short term PT Goals (target date for  Short term goals are 3 weeks 01/16/2022) Patient will demonstrate independent use of home exercise program to maintain progress from in clinic treatments. Goal status: MET 01/16/22   Long term PT goals (target dates for all long term goals are 4 weeks  04/05/2022 )   1. Patient will demonstrate/report pain at worst less than or equal to 2/10 to facilitate minimal limitation in daily activity secondary to pain symptoms. Goal status: revised - 03/08/2022   2. Patient will demonstrate independent use of home exercise program to facilitate ability to maintain/progress functional gains from skilled physical therapy services. Goal status: revised - 03/08/2022   3. Patient will demonstrate FOTO outcome > or = 52 % to indicate reduced disability due to condition. Goal status: revised - 03/08/2022   4.  Patient will demonstrate Rt [place] joint mobility WFL to facilitate usual self care, dressing, reaching overhead at PLOF s limitation due to symptoms.  Goal status: revised - 03/08/2022   5.  Patient will demonstrate Rt UE MMT 4/5 or greater throughout to facilitate usual lifting, carrying in functional activity to PLOF s limitation.   Goal status: revised - 03/08/2022   6.  Patient will demonstrate ability to sleep s restriction due to symptom.  Goal status: revised - 03/08/2022     PLAN: PT FREQUENCY:  1x/week   PT DURATION: 4 weeks   PLANNED INTERVENTIONS: Therapeutic exercises, Therapeutic activity, Neuro Muscular re-education, Balance training, Gait training, Patient/Family education, Joint mobilization, Stair training, DME instructions, Dry Needling, Electrical stimulation, Cryotherapy, Moist heat, Taping, Ultrasound, Ionotophoresis 73m/ml Dexamethasone, and Manual therapy.  All included unless contraindicated   PLAN FOR NEXT SESSION: continue to progress RTC strengthening in all shoulder motions, stretching for increased functional IR range  KJana Hakim SPT 03/08/2022, 12:38 PM  This entire  session  of physical therapy was performed under the direct supervision of PT signing evaluation /treatment. PT reviewed note and agrees.  Scot Jun, PT, DPT, OCS, ATC 03/08/22  12:41 PM

## 2022-03-11 ENCOUNTER — Encounter: Payer: Self-pay | Admitting: Cardiovascular Disease

## 2022-03-11 ENCOUNTER — Ambulatory Visit: Payer: Medicare Other | Attending: Cardiovascular Disease | Admitting: Cardiovascular Disease

## 2022-03-11 VITALS — BP 122/64 | HR 62 | Ht 62.0 in | Wt 145.6 lb

## 2022-03-11 DIAGNOSIS — E785 Hyperlipidemia, unspecified: Secondary | ICD-10-CM

## 2022-03-11 DIAGNOSIS — I2510 Atherosclerotic heart disease of native coronary artery without angina pectoris: Secondary | ICD-10-CM

## 2022-03-11 DIAGNOSIS — R0602 Shortness of breath: Secondary | ICD-10-CM | POA: Diagnosis not present

## 2022-03-11 DIAGNOSIS — F419 Anxiety disorder, unspecified: Secondary | ICD-10-CM | POA: Insufficient documentation

## 2022-03-11 DIAGNOSIS — I10 Essential (primary) hypertension: Secondary | ICD-10-CM

## 2022-03-11 DIAGNOSIS — E119 Type 2 diabetes mellitus without complications: Secondary | ICD-10-CM

## 2022-03-11 DIAGNOSIS — F32A Depression, unspecified: Secondary | ICD-10-CM | POA: Diagnosis not present

## 2022-03-11 DIAGNOSIS — J45909 Unspecified asthma, uncomplicated: Secondary | ICD-10-CM

## 2022-03-11 DIAGNOSIS — Z720 Tobacco use: Secondary | ICD-10-CM | POA: Diagnosis not present

## 2022-03-11 DIAGNOSIS — Z9861 Coronary angioplasty status: Secondary | ICD-10-CM | POA: Diagnosis not present

## 2022-03-11 NOTE — Progress Notes (Signed)
Cardiology Office Note    Date:  03/11/2022   ID:  [name] [name], DOB 1955/11/28, MRN [id number]  PCP:  [name] [name], No  Cardiologist:  [name] Majestic, MD   25-monthfollow-up evaluation   History of Present Illness:  [name] HARTLis a 66y.o. female who presents for follow-up cardiology evaluation.  I last saw her on February 10, 2020.  Ms. [name] Rumpfhas known coronary artery disease in 2001 underwent initial stenting of her right coronary artery but Dr. BGwenlyn Foundwith placement of bare-metal stent. The following day, I perform staging intervention to proximal and mid LAD, as well as [place] arthrotomy to the ostium of her diagonal vessel. 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 She continues to have bare-metal stents in the LAD and diagonal system. She has a history of hypertension, hyperlipidemia, as well as hypothyroidism.   In September 2013 she saw [name] [name] At that time, she was noticing some claudication symptoms and also was experiencing some lower extremity edema.   She has a history of depression and anxiety [facility] Apparently she was evaluated on 07/09/2013 after experiencing a one-week history of increasing depression, anxiety, tearfulness and suicidal at a ration. She had been drinking to excess and also had used crack cocaine. She also had been hearing voices.   When I last saw her in January 2015 she was experiencing episodes of chest discomfort. These often are nonexertional. She is still smoking. She was hospitalized at behavioral health for one week and was is in outpatient treatment.  Over the last 6 years, she has continued to smoke cigarettes and has been smoking for at least 50 years.  She has COPD and had seen Dr. YAnnamaria Bootsin the past and more [facility]  She developed an Covid infection and has been followed by Dr. WJoya [name]  She also is followed with behavioral therapist for severe anxiety syndrome and has been on Lexipro.   In April 2021 she was evaluated by [name] Lofts PA-C at our office.  During her lost to follow-up time, she had undergone a cardiac catheterization in September 2018 which was done by Dr. STamala Julianwhich showed chronic total occlusion of the RCA due to in-stent restenosis with left-to-right collaterals, diffuse 50% narrowing in the proximal LAD, diffuse 40 to 50% in-stent restenosis in the first diagonal, 70% mid OM 2 stenosis, and 50% proximal OM 3 stenosis.  EF was 65%.  Aggressive medical therapy was recommended.  An echo Doppler study showed an EF of 55 to 60% with grade 1 diastolic dysfunction and without regional wall motion abnormalities.  She is an hyporesponder to Plavix and had in-stent restenosis with Plavix and was therefore switched to Brilinta.  In May 2021 she underwent a Lexiscan Myoview study which was low risk and showed normal perfusion.  She has a history of hypothyroidism and is on levothyroxine.   At her last visit with me on February 10, 2020, blood pressure was controlled on a regimen consisting of amlodipine 5 mg, furosemide 20 mg, nebivolol 10 mg, and losartan 75 mg.  She continues to be on aspirin and full dose ticagrelor.  During that evaluation I suggested she reduce her Brilinta dose to 60 mg twice a day.  Remotely she was [name] to be a hyper responder to Plavix and for this reason ticagrelor continuation was recommended.  Continues to smoke cigarettes and had a long discussion with her regarding smoking cessation.  Since I saw her, she was evaluated by [name] [name] in April 2023 for preoperative clearance prior to undergoing right shoulder surgery by Dr. Lorin [name].  He was given cardiac clearance unfortunately she was still smoking a pack per day.  She was without any anginal  symptomatology.  Presently, she comes to the office today with her daughter who she has been living with since her right shoulder surgery.  Unfortunately she continues to smoke cigarettes and is now smoking half pack per day.  She started smoking at age 69 and has been smoking for 54 years.  She continues to be on amlodipine 5 mg, furosemide 20 mg, losartan 50 mg, and has been taking nebivolol 10 mg daily.  She has COPD and is on Trelegy Ellipta, albuterol and DuoNeb.  She continues to be on levothyroxine 112 mcg for hypothyroidism and takes gabapentin for neuropathy.  She is on Lexapro for anxiety.  She presents for evaluation.  Past Medical History:  Diagnosis Date   Allergy    seasonal   Anemia    Anxiety    takes Lexapro daily   Arthritis    "back, from neck down pass my bra area" (03/25/2017)   Asthma    Bartholin gland cyst 08/29/2011   Bruises easily    pt is on Effient   Chronic back pain    herniated nucleus pulposus   Chronic back pain    "neck to bra area; lower back" (03/25/2017)   Chronic kidney disease    recurrent UTI's this year 2022   COPD (chronic obstructive pulmonary disease) (HCC)    early stages   Coronary artery disease    Depression    takes Klonopin daily   Diabetes mellitus without complication ([place])    Diverticulosis    Fibroadenoma of left breast    GERD (gastroesophageal reflux disease)    takes Nexium daily   H/O hiatal hernia    Heart attack ([place]) 2011   Hemorrhoids    Hernia    Hyperlipidemia    takes Lipitor daily   Hypertension    takes Losartan daily and Labetalol bid   Hypothyroidism    takes Synthroid daily   Insomnia    hydroxyzine prn   Joint pain    Pneumonia    "couple times" (03/25/2017)   Pre-diabetes    "just [name] out 1 wk ago" (03/25/2017)   Psoriasis    elbows,knees,back   Shortness of breath    with exertion   Slowing of urinary stream    Stress incontinence     Past Surgical History:  Procedure Laterality Date    [place]   "left one of my ovaries"   BACK SURGERY     BIOPSY  05/27/2019   Procedure: BIOPSY;  Surgeon: Daneil Dolin, MD;  [facility];  Service: Endoscopy;;   COLONOSCOPY  2011   Dr. Ardis Hughs: Mild diverticulosis, descending diminutive colon polyp (not retrieved), next colonoscopy 10 years   CORONARY ANGIOPLASTY WITH STENT PLACEMENT  2011 X2   "regular stents didn't work; had to go back in in ~ 1 month and put in medicated stents"   DILATION AND CURETTAGE OF UTERUS     ESOPHAGOGASTRODUODENOSCOPY     ESOPHAGOGASTRODUODENOSCOPY (  EGD) WITH PROPOFOL N/A 05/27/2019   normal esophagus, dilation, erosive gastropathy s/p biopsy, normal duodenum. Negative H.pylori.    LEFT HEART CATH AND CORONARY ANGIOGRAPHY N/A 03/26/2017   Procedure: LEFT HEART CATH AND CORONARY ANGIOGRAPHY;  Surgeon: Belva Crome, MD;  Location: [facility];  Service: Cardiovascular;  Laterality: N/A;   LUMBAR LAMINECTOMY/DECOMPRESSION MICRODISCECTOMY  08/05/2011   Procedure: LUMBAR LAMINECTOMY/DECOMPRESSION MICRODISCECTOMY;  Surgeon: Otilio Connors, MD;  Location: [facility];  Service: Neurosurgery;  Laterality: Right;  Right Lumbar four-five extraforaminal discectomy   MALONEY DILATION N/A 05/27/2019   Procedure: Venia Minks DILATION;  Surgeon: Daneil Dolin, MD;  [facility];  Service: Endoscopy;  Laterality: N/A;  54   SHOULDER ARTHROSCOPY WITH ROTATOR CUFF REPAIR AND OPEN BICEPS TENODESIS Right 12/24/2021   Procedure: RIGHT SHOULDER ARTHROSCOPY WITH ROTATOR CUFF REPAIR AND BICEPS TENODESIS;  Surgeon: Marybelle Killings, MD;  Location: [facility];  Service: Orthopedics;  Laterality: Right;   TONSILLECTOMY     as a child   UPPER GASTROINTESTINAL ENDOSCOPY      Current Medications: Outpatient Medications Prior to Visit  Medication Sig Dispense Refill   albuterol (VENTOLIN HFA) 108 (90 Base) MCG/ACT inhaler INHALE 2 PUFFS INTO THE LUNGS EVERY  4-6 HOURS AS NEEDED FOR WHEEZING OR SHORTNESS OF BREATH. 18 g 2   amLODipine (NORVASC) 5 MG tablet TAKE 1 TABLET (5 MG TOTAL) BY MOUTH DAILY. 90 tablet 0   aspirin EC 81 MG tablet Take 81 mg by mouth daily.     atorvastatin (LIPITOR) 80 MG tablet Take 1 tablet (80 mg total) by mouth daily. please schedule appointment for more refills 30 tablet 0   Blood Glucose Monitoring Suppl (TRUE METRIX METER) DEVI 1 kit by Does not apply route 4 (four) times daily. 1 Device 0   busPIRone (BUSPAR) 15 MG tablet TAKE 2 TABLETS IN THE MORNING, 1 TAB AT NOON AND 1 TAB IN THE EVENING 120 tablet 3   Chlorpheniramine-DM (COUGH & COLD HBP PO) Take 1 tablet by mouth daily as needed (congestion).     [START ON 03/15/2022] clonazePAM (KLONOPIN) 0.5 MG tablet Take 1 tablet (0.5 mg total) by mouth 2 (two) times daily as needed for anxiety. 60 tablet 1   doxepin (SINEQUAN) 75 MG capsule Take 1 capsule (75 mg total) by mouth at bedtime. 30 capsule 3   escitalopram (LEXAPRO) 10 MG tablet TAKE 3 TABLETS (30 MG TOTAL) BY MOUTH DAILY. 90 tablet 3   ferrous sulfate 325 (65 FE) MG tablet Take 1 tablet (325 mg total) by mouth daily with breakfast. 100 tablet 1   Fluticasone-Umeclidin-Vilant (TRELEGY ELLIPTA) 100-62.5-25 MCG/ACT AEPB Inhale 1 puff into the lungs daily 60 each 2   furosemide (LASIX) 20 MG tablet Take 1 tablet (20 mg total) by mouth daily. 30 tablet 2   gabapentin (NEURONTIN) 600 MG tablet TAKE 1 TABLET BY MOUTH 4 TIMES DAILY 360 tablet 1   glucose blood (TRUE METRIX BLOOD GLUCOSE TEST) test strip Use as instructed 100 each 12   ipratropium-albuterol (DUONEB) 0.5-2.5 (3) MG/3ML SOLN TAKE 3 MLS VIA NEBULIZATION EVERY 6 HOURS 360 mL 1   levothyroxine (SYNTHROID) 112 MCG tablet Take 1 tablet (112 mcg total) by mouth daily. 30 tablet 6   loperamide (IMODIUM A-D) 2 MG tablet Take 4 mg by mouth 4 (four) times daily as needed for diarrhea or loose stools.     losartan (COZAAR) 50 MG tablet TAKE 1 & 1/2 TABLETS BY MOUTH DAILY.  [place]  tablet 2   metFORMIN (GLUCOPHAGE) 500 MG tablet TAKE 1 TABLET (500 MG TOTAL) BY MOUTH 2 (TWO) TIMES DAILY WITH A MEAL. 60 tablet 3   methocarbamol (ROBAXIN) 500 MG tablet Take 1 tablet (500 mg total) by mouth 2 (two) times daily as needed for muscle spasms. 60 tablet 1   mirtazapine (REMERON) 30 MG tablet TAKE 1 TABLET (30 MG TOTAL) BY MOUTH AT BEDTIME. 30 tablet 3   montelukast (SINGULAIR) 10 MG tablet TAKE 1 TABLET BY MOUTH AT BEDTIME. 30 tablet 2   nebivolol (BYSTOLIC) 10 MG tablet TAKE 1 TABLET (10 MG TOTAL) BY MOUTH DAILY. 90 tablet 1   omeprazole (PRILOSEC) 40 MG capsule TAKE 1 CAPSULE (40 MG TOTAL) BY MOUTH 2 (TWO) TIMES DAILY BEFORE A MEAL. 60 capsule 2   sodium chloride (OCEAN) 0.65 % SOLN nasal spray Place 1 spray into both nostrils as needed for congestion.     ticagrelor (BRILINTA) 60 MG TABS tablet TAKE 1 TABLET (60 MG TOTAL) BY MOUTH 2 (TWO) TIMES DAILY. 60 tablet 5   traMADol (ULTRAM) 50 MG tablet Take 1 tablet (50 mg total) by mouth every 6 (six) hours as needed. 40 tablet 0   TRUEPLUS LANCETS 28G MISC 28 g by Does not apply route QID. 120 each 2   nicotine (NICODERM CQ - DOSED IN MG/24 HOURS) 21 mg/24hr patch Place 1 patch (21 mg total) onto the skin daily. 28 patch 1   nicotine polacrilex (NICOTINE MINI) 4 MG lozenge Use three times a day as needed to stop smoking 100 tablet 0   oxyCODONE-acetaminophen (PERCOCET/ROXICET) 5-325 MG tablet Take 1 tablet by mouth every 4 (four) hours as needed for severe pain. 50 tablet 0   nitroGLYCERIN (NITROSTAT) 0.4 MG SL tablet Place 1 tablet (0.4 mg total) under the tongue every 5 (five) minutes as needed for chest pain. 25 tablet 3   No facility-administered medications prior to visit.     Allergies:   Avocado, Latex, and Codeine   Social History   Socioeconomic History   Marital status: Divorced    Spouse name: Not on file   Number of children: 1   Years of education: Not on file   Highest education level: 9th grade  Occupational  History   Not on file  Tobacco Use   Smoking status: Every Day    Packs/day: 0.50    Years: 50.00    Total pack years: 25.00    Types: Cigarettes   Smokeless tobacco: Never  Vaping Use   Vaping Use: Some days   Substances: Nicotine  Substance and Sexual Activity   Alcohol use: Yes    Comment: once a week   Drug use: Yes    Types: Marijuana    Comment: history of cocaine use, been about a year, per patient (12/18/21)   Sexual activity: Not Currently    Birth control/protection: Surgical  Other Topics Concern   Not on file  Social History Narrative   Not on file   Social Determinants of Health   Financial Resource Strain: Low Risk  (06/09/2018)   Overall Financial Resource Strain (CARDIA)    Difficulty of Paying Living Expenses: Not hard at all  Food Insecurity: [place] Present (12/20/2021)   Hunger Vital Sign    Worried About [place] in the Last Year: Sometimes true    Ran Out of Food in the Last Year: Sometimes true  Transportation Needs: Unmet Transportation Needs (12/20/2021)   PRAPARE - Transportation  Lack of Transportation (Medical): Yes    Lack of Transportation (Non-Medical): No  Physical Activity: Inactive (12/20/2021)   Exercise Vital Sign    Days of Exercise per Week: 0 days    Minutes of Exercise per Session: 0 min  Stress: Stress Concern Present (12/20/2021)   [place]    Feeling of Stress : To some extent  Social Connections: Moderately Isolated (12/20/2021)   Social Connection and Isolation Panel [NHANES]    Frequency of Communication with Friends and Family: More than three times a week    Frequency of Social Gatherings with Friends and Family: More than three times a week    Attends Religious Services: 1 to 4 times per year    Active Member of [employer] or Organizations: No    Attends Archivist Meetings: Never    Marital Status: Divorced     Family History:  The  patient's family history includes Anesthesia problems in her daughter; Breast cancer in her maternal aunt; Cancer in her paternal grandmother; Colon polyps in her brother; Diabetes in her father; Heart disease in her father, maternal grandfather, and mother; Hypertension in her father and mother; Mental illness in her mother; Stroke in her mother; Throat cancer in her maternal uncle; Thyroid disease in her paternal aunt.   ROS General: Negative; No fevers, chills, or night sweats;  HEENT: Negative; No changes in vision or hearing, sinus congestion, difficulty swallowing Pulmonary: Positive for COPD/asthma Cardiovascular: Negative; No chest pain, presyncope, syncope, palpitations GI: Negative; No nausea, vomiting, diarrhea, or abdominal pain GU: Negative; No dysuria, hematuria, or difficulty voiding Musculoskeletal: Recent right shoulder surgery involving rotator cuff, biceps tendon, and frozen shoulder. Hematologic/Oncology: Negative; no easy bruising, bleeding Endocrine: Positive for hypothyroidism and prediabetes Neuro: Negative; no changes in balance, headaches Skin: Negative; No rashes or skin lesions Psychiatric: Positive for anxiety Sleep: Negative; No snoring, daytime sleepiness, hypersomnolence, bruxism, restless legs, hypnogognic hallucinations, no cataplexy Other comprehensive 14 point system review is negative.   PHYSICAL EXAM:   VS:  BP 122/64   Pulse 62   Ht '5\' 2"'  (1.575 m)   Wt 145 lb 9.6 oz (66 kg)   SpO2 96%   BMI 26.63 kg/m     Repeat blood pressure by me 132/68  Wt Readings from Last 3 Encounters:  03/11/22 145 lb 9.6 oz (66 kg)  02/26/22 143 lb (64.9 kg)  02/18/22 143 lb 14.4 oz (65.3 kg)    BP 122/64   Pulse 62   Ht '5\' 2"'  (1.575 m)   Wt 145 lb 9.6 oz (66 kg)   SpO2 96%   BMI 26.63 kg/m  General: Alert, oriented, no distress.  Skin: normal turgor, no rashes, warm and dry HEENT: Normocephalic, atraumatic. Pupils equal round and reactive to light; sclera  anicteric; extraocular muscles intact; Nose without nasal septal hypertrophy Mouth/Parynx benign; Mallinpatti scale 3 Neck: No JVD, no carotid bruits; normal carotid upstroke Lungs: Decreased breath sounds, no rales Chest wall: without tenderness to palpitation Heart: PMI not displaced, RRR, s1 s2 normal, 1/6 systolic murmur, no diastolic murmur, no rubs, gallops, thrills, or heaves Abdomen: soft, nontender; no hepatosplenomehaly, BS+; abdominal aorta nontender and not dilated by palpation. Back: no CVA tenderness Pulses 2+ Musculoskeletal: full range of motion, normal strength, no joint deformities Extremities: no clubbing cyanosis or edema, Homan's sign negative  Neurologic: grossly nonfocal; Cranial nerves grossly wnl Psychologic: Normal mood and affect    Studies/Labs Reviewed:   March 11, 2022 ECG (independently read by me): NSR at 62, ILBBB,    NST wave abnormality; QTc 477 msec  February 10, 2020 ECG (independently read by me): Personally reviewed from November 08, 2019 which reveals normal sinus rhythm at 85 bpm.  QTc interval 466 ms.  No ectopy.  January 2015 EKG:  EKG is ordered today.  ECG (independently read by me): Normal sinus rhythm at 62 bpm.  No ectopy.  Normal intervals.  No ST segment changes.  Recent Labs:    Latest Ref Rng & Units 02/18/2022    1:51 PM 12/18/2021    3:40 PM 10/03/2021   12:30 PM  BMP  Glucose 70 - 99 mg/dL 105  112  104   BUN 8 - 23 mg/dL '15  12  10   ' Creatinine 0.44 - 1.00 mg/dL 0.86  0.98  0.78   Sodium 135 - 145 mmol/L 139  139  139   Potassium 3.5 - 5.1 mmol/L 4.2  3.9  4.0   Chloride 98 - 111 mmol/L 106  105  105   CO2 22 - 32 mmol/L '25  24  28   ' Calcium 8.9 - 10.3 mg/dL 9.7  9.2  9.8         Latest Ref Rng & Units 02/18/2022    1:51 PM 10/03/2021   12:30 PM 09/14/2021   10:58 AM  Hepatic Function  Total Protein 6.5 - 8.1 g/dL 7.5  7.2  7.2   Albumin 3.5 - 5.0 g/dL 4.4  4.3  4.8   AST 15 - 41 U/L '19  11  17   ' ALT 0 - 44 U/L '15  11  10    ' Alk Phosphatase 38 - 126 U/L 91  97  102   Total Bilirubin 0.3 - 1.2 mg/dL 0.1  0.4  0.2        Latest Ref Rng & Units 02/18/2022    1:51 PM 11/28/2021    4:45 PM 11/16/2021    4:35 PM  CBC  WBC 4.0 - 10.5 K/uL 5.8  7.1  8.6   Hemoglobin 12.0 - 15.0 g/dL 15.5  12.5  10.9   Hematocrit 36.0 - 46.0 % 46.7  38.3  34.3   Platelets 150 - 400 K/uL 515  429.0  531.0    Lab Results  Component Value Date   MCV 92.5 02/18/2022   MCV 87.3 11/28/2021   MCV 86.1 11/16/2021   Lab Results  Component Value Date   TSH 0.502 10/25/2021   Lab Results  Component Value Date   HGBA1C 5.3 12/18/2021     BNP    Component Value Date/Time   BNP 97.6 02/11/2021 0416    ProBNP No results [name] for: "PROBNP"   Lipid Panel     Component Value Date/Time   CHOL 147 11/24/2020 0840   TRIG 109 11/24/2020 0840   HDL 58 11/24/2020 0840   CHOLHDL 2.5 11/24/2020 0840   CHOLHDL 3.8 04/16/2016 0934   VLDL 15 04/16/2016 0934   LDLCALC 69 11/24/2020 0840   LABVLDL 20 11/24/2020 0840     RADIOLOGY: No results [name].   Additional studies/ records that were reviewed today include:  I reviewed my records from 20.  Subsequent records in the interim have been reviewed.  ASSESSMENT:    1. Essential hypertension, benign   2. CAD S/P percutaneous coronary angioplasty   3. Dyslipidemia   4. SOB (shortness of breath)   5. Tobacco abuse   6. Chronic  asthma without complication, unspecified asthma severity, unspecified whether persistent   7. Anxiety and depression   8. Type 2 diabetes mellitus without complication, without long-term current use of insulin [facility])      PLAN:  Ms. [name] [name] is a 66 year old female who has a history of known CAD and is status post PCI/DES stenting to her right coronary artery in 2001 with subsequent staged intervention to her LAD and PCI to her diagonal.  She had developed in-stent restenosis to her RCA.  She has a history of hypertension, hyperlipidemia, diabetes  mellitus, COPD with asthma, GERD as well as history of erosive gastropathy.  She underwent repeat cardiac catheterization in 2018 which showed chronic total occlusion of her RCA due to the in-stent restenosis with left-to-right collaterals.  Concomitant disease was noted in the proximal LAD first diagonal as well as circumflex marginal vessels.  Echo Doppler study in 2018 showed normal systolic function with EF 55 to 60% and grade 1 diastolic dysfunction.  Her most recent nuclear perfusion study was normal.  She is now on aspirin/low-dose Brilinta 60 mg twice a day and was a hyper responder to Plavix.  He has COPD-asthma and has been on DuoNeb, Singulair, and is now on Trelegy Ellipta.  Presently, she has been without anginal symptomatology.  Tolerated right shoulder surgery well without cardiovascular compromise.  A long discussion with her today regarding the importance of absolute smoking cessation.  She has been smoking for approximately 54 years.  I have recommended she undergo follow-up 2D echo Doppler study for reassessment of LV systolic and diastolic function since she does note some mild shortness of breath which may also be related to her asthma.  She has not had recent lipid studies since 2022.  I am recommending fasting lipid panel and LP(a).  I have reviewed recent other studies from February 18, 2022 which showed normal creatinine at 0.86.  Hemoglobin 15.5.  TSH in April 2023 was normal at 0.502.  I will contact her regarding the results of the above studies and see her in 6 months for reevaluation.  Medication Adjustments/Labs and Tests Ordered: Current medicines are reviewed at length with the patient today.  Concerns regarding medicines are outlined above.  Medication changes, Labs and Tests ordered today are listed in the Patient Instructions below. Patient Instructions  Medication Instructions:  Continue same medications *If you need a refill on your cardiac medications before your next  appointment, please call your pharmacy*   Lab Work:  Lipid panel,LPa    Testing/Procedures: Echo   Follow-Up: At [facility], you and your health needs are our priority.  As part of our continuing mission to provide you with exceptional heart care, we have created designated Provider Care Teams.  These Care Teams include your primary Cardiologist (physician) and Advanced Practice Providers (APPs -  Physician Assistants and Nurse Practitioners) who all work together to provide you with the care you need, when you need it.  We recommend signing up for the patient portal called "MyChart".  Sign up information is provided on this After Visit Summary.  MyChart is used to connect with patients for Virtual Visits (Telemedicine).  Patients are able to view lab/test results, encounter notes, upcoming appointments, etc.  Non-urgent messages can be sent to your provider as well.   To learn more about what you can do with MyChart, go to [email or web address].    Your next appointment:  6 months   Call in Oct to schedule Feb appointment  The format for your next appointment: Office     Provider:  [facility]   Important Information About Sugar          Signed, [name] Majestic, MD  03/11/2022 6:32 PM    [facility] [address] [place], [place], [place]  [address] Phone: [phone]  03/11/2022 6:32 PM    [facility] [address] [place], [place], [place]  [address] Phone: [phone]

## 2022-03-11 NOTE — Patient Instructions (Signed)
Medication Instructions:  Continue same medications *If you need a refill on your cardiac medications before your next appointment, please call your pharmacy*   Lab Work:  Lipid panel,LPa    Testing/Procedures: Echo   Follow-Up: At [facility], you and your health needs are our priority.  As part of our continuing mission to provide you with exceptional heart care, we have created designated Provider Care Teams.  These Care Teams include your primary Cardiologist (physician) and Advanced Practice Providers (APPs -  Physician Assistants and Nurse Practitioners) who all work together to provide you with the care you need, when you need it.  We recommend signing up for the patient portal called "MyChart".  Sign up information is provided on this After Visit Summary.  MyChart is used to connect with patients for Virtual Visits (Telemedicine).  Patients are able to view lab/test results, encounter notes, upcoming appointments, etc.  Non-urgent messages can be sent to your provider as well.   To learn more about what you can do with MyChart, go to [email or web address].    Your next appointment:  6 months   Call in Oct to schedule Feb appointment     The format for your next appointment: Office     Provider:  [facility]   Important Information About Sugar

## 2022-03-15 ENCOUNTER — Encounter: Payer: Medicare Other | Admitting: Physical Therapy

## 2022-03-19 ENCOUNTER — Ambulatory Visit: Payer: Medicare Other | Admitting: Critical Care Medicine

## 2022-03-20 ENCOUNTER — Encounter: Payer: Self-pay | Admitting: Rehabilitative and Restorative Service Providers"

## 2022-03-20 ENCOUNTER — Ambulatory Visit (INDEPENDENT_AMBULATORY_CARE_PROVIDER_SITE_OTHER): Payer: Medicare Other | Admitting: Rehabilitative and Restorative Service Providers"

## 2022-03-20 DIAGNOSIS — M25611 Stiffness of right shoulder, not elsewhere classified: Secondary | ICD-10-CM | POA: Diagnosis not present

## 2022-03-20 DIAGNOSIS — M6281 Muscle weakness (generalized): Secondary | ICD-10-CM | POA: Diagnosis not present

## 2022-03-20 DIAGNOSIS — R600 Localized edema: Secondary | ICD-10-CM | POA: Diagnosis not present

## 2022-03-20 DIAGNOSIS — M25511 Pain in right shoulder: Secondary | ICD-10-CM | POA: Diagnosis not present

## 2022-03-20 DIAGNOSIS — G8929 Other chronic pain: Secondary | ICD-10-CM

## 2022-03-20 NOTE — Therapy (Signed)
OUTPATIENT PHYSICAL THERAPY TREATMENT NOTE & PROGRESS NOTE /RECERT   Patient Name: [name] [name] MRN: [id number] DOB:12/29/1955, 66 y.o., female Today's Date: 03/20/2022  Progress Note Reporting Period 01/30/2022 to 03/08/2022  See note below for Objective Data and Assessment of Progress/Goals.     END OF SESSION:   PT End of Session - 03/20/22 1148     Visit Number 21    Number of Visits 24    Date for PT Re-Evaluation 04/05/22    Authorization Type Medicare - kx after 15    Progress Note Due on Visit 30    PT Start Time 1144    PT Stop Time 1223    PT Time Calculation (min) 39 min    Activity Tolerance Patient tolerated treatment well   pain in mobility   Behavior During Therapy WFL for tasks assessed/performed              Past Medical History:  Diagnosis Date   Allergy    seasonal   Anemia    Anxiety    takes Lexapro daily   Arthritis    "back, from neck down pass my bra area" (03/25/2017)   Asthma    Bartholin gland cyst 08/29/2011   Bruises easily    pt is on Effient   Chronic back pain    herniated nucleus pulposus   Chronic back pain    "neck to bra area; lower back" (03/25/2017)   Chronic kidney disease    recurrent UTI's this year 2022   COPD (chronic obstructive pulmonary disease) (HCC)    early stages   Coronary artery disease    Depression    takes Klonopin daily   Diabetes mellitus without complication ([place])    Diverticulosis    Fibroadenoma of left breast    GERD (gastroesophageal reflux disease)    takes Nexium daily   H/O hiatal hernia    Heart attack ([place]) 2011   Hemorrhoids    Hernia    Hyperlipidemia    takes Lipitor daily   Hypertension    takes Losartan daily and Labetalol bid   Hypothyroidism    takes Synthroid daily   Insomnia    hydroxyzine prn   Joint pain    Pneumonia    "couple times" (03/25/2017)   Pre-diabetes    "just found out 1 wk ago" (03/25/2017)   Psoriasis    elbows,knees,back   Shortness of breath     with exertion   Slowing of urinary stream    Stress incontinence    Past Surgical History:  Procedure Laterality Date   [place]   "left one of my ovaries"   BACK SURGERY     BIOPSY  05/27/2019   Procedure: BIOPSY;  Surgeon: Daneil Dolin, MD;  [facility]: [facility];  Service: Endoscopy;;   COLONOSCOPY  2011   Dr. Ardis Hughs: Mild diverticulosis, descending diminutive colon polyp (not retrieved), next colonoscopy 10 years   CORONARY ANGIOPLASTY WITH STENT PLACEMENT  2011 X2   "regular stents didn't work; had to go back in in ~ 1 month and put in medicated stents"   DILATION AND CURETTAGE OF UTERUS     ESOPHAGOGASTRODUODENOSCOPY     ESOPHAGOGASTRODUODENOSCOPY (EGD) WITH PROPOFOL N/A 05/27/2019   normal esophagus, dilation, erosive gastropathy s/p biopsy, normal duodenum. Negative H.pylori.    LEFT HEART CATH AND CORONARY ANGIOGRAPHY N/A 03/26/2017   Procedure: LEFT HEART CATH AND CORONARY ANGIOGRAPHY;  Surgeon: Belva Crome, MD;  Location: [facility];  Service: Cardiovascular;  Laterality: N/A;   LUMBAR LAMINECTOMY/DECOMPRESSION MICRODISCECTOMY  08/05/2011   Procedure: LUMBAR LAMINECTOMY/DECOMPRESSION MICRODISCECTOMY;  Surgeon: Otilio Connors, MD;  Location: [facility];  Service: Neurosurgery;  Laterality: Right;  Right Lumbar four-five extraforaminal discectomy   MALONEY DILATION N/A 05/27/2019   Procedure: Venia Minks DILATION;  Surgeon: Daneil Dolin, MD;  Location: [facility];  Service: Endoscopy;  Laterality: N/A;  54   SHOULDER ARTHROSCOPY WITH ROTATOR CUFF REPAIR AND OPEN BICEPS TENODESIS Right 12/24/2021   Procedure: RIGHT SHOULDER ARTHROSCOPY WITH ROTATOR CUFF REPAIR AND BICEPS TENODESIS;  Surgeon: Marybelle Killings, MD;  Location: [facility];  Service: Orthopedics;  Laterality: Right;   TONSILLECTOMY     as a child   UPPER GASTROINTESTINAL ENDOSCOPY     Patient Active Problem List   Diagnosis Date Noted    Nontraumatic complete tear of right rotator cuff    Superior glenoid labrum lesion of left shoulder    Iron deficiency anemia 11/02/2021   Major depressive disorder, recurrent, in full remission with anxious distress ([place]) 05/31/2021   Rotator cuff tendinitis, right 05/08/2021   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   Chronic neck pain 02/26/2021   GAD (generalized anxiety disorder) 01/08/2021   Acute cystitis without hematuria 11/07/2020   Influenza vaccine needed 06/15/2020   Abdominal pain, epigastric 04/28/2019   Esophageal dysphagia 04/28/2019   Bipolar I disorder ([place]) 09/17/2018   Insomnia 10/28/2017   Prediabetes 05/28/2017   QT prolongation 03/25/2017   Encounter for screening mammogram for breast cancer 09/11/2015   Psoriasis 06/22/2015   COPD (chronic obstructive pulmonary disease) (HCC)GOLD E 05/18/2015   Anxiety and depression 07/10/2013   Essential hypertension, benign 06/07/2013   Dyslipidemia 06/07/2013   Asthma, chronic 06/07/2013   Emphysema lung ([place]) 06/07/2013   Chronic back pain 06/07/2013   CAD S/P percutaneous coronary angioplasty 06/07/2013   GERD (gastroesophageal reflux disease) 06/07/2013   Breast lump on left side at 3 o'clock position 10/13/2012   S/P abdominal hysterectomy and right salpingo-oophorectomy 08/29/2011   TOBACCO ABUSE 07/17/2009   Chronic rhinitis 07/17/2009   Lung nodule < 6cm on CT 07/17/2009   ALLERGY, FOOD 07/17/2009   Hypothyroidism 12/22/2008     THERAPY DIAG:  Chronic right shoulder pain  Muscle weakness (generalized)  Stiffness of right shoulder, not elsewhere classified  Localized edema  PCP: Troy Sine MD   REFERRING PROVIDER: Marybelle Killings, MD   REFERRING DIAG: M75.81 (ICD-10-CM) - Rotator cuff tendinitis, right   Rationale for Evaluation and Treatment Rehabilitation   ONSET DATE: Surgery 12/24/2021   SUBJECTIVE:  SUBJECTIVE STATEMENT: Pt indicated feeling tight today, no specific pain upon arrival.  Pt indicated lifting arm but slow.   PERTINENT HISTORY: Chronic back pain, CKD, COPD, CAD, depression, DM, GERD   PAIN:  NPRS scale: 0/10 upon arrival.  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    OBJECTIVE:    PATIENT SURVEYS:  03/20/2022:  FOTO update 59  02/13/2022:  FOTO update:   48  12/26/2021: FOTO intake:  20  predicted:  52   POSTURE: 12/26/2021 Rounded shoulder, forward head posture.  Rt arm in sling upon arrival              Localized edema Rt shoulder visibly    UPPER EXTREMITY ROM:    ROM Right 12/26/2021 Passive in supine - All limited by pain Right 01/02/2022  PROM - limited by pain response Right 01/09/2022  PROM supine PROM Right 01/11/2022 assessed supine 01/18/2022 PROM in supine 01/25/2022: PROM in supine 02/01/2022: AAROM 03/08/2022: AROM  Shoulder flexion 10 100 in supine 125 in spuine  115 supine 125 supine 125 supine 145 supine 160 supine  Shoulder extension           Shoulder abduction 5 90 in supine    99 in supine  128 supine  Shoulder adduction           Shoulder internal rotation To belly With arm at side   55 degrees assessed at 70 degrees abduction    64 degrees assessed at 70 degrees abduction  Shoulder external rotation -20 With arm at side 0 in 30 deg abduction  42 in 30 deg abduction supine 50 degrees assessed at 70 degrees  abduction 53 degrees assessed at 70 degrees abduction 50 degrees assessed at 60 degrees abduction  55 degrees assessed at 70 degrees abduction  Elbow flexion WFL passively         Elbow extension -20 deg c pain in shoulder passively         (Blank rows = not tested)   UPPER EXTREMITY MMT:                         12/26/2021:  not tested due to protocol for surgery - Rt shoulder MMT Right 02/08/2022   Right 02/20/2022   Right 02/27/2022 Right 03/06/2022  Shoulder flexion  2+/5  3/5 - shrug noted against gravity 3+/5 c shrug noted 3+/5 c shrug against gravity  Shoulder extension        Shoulder abduction        Shoulder adduction        Shoulder internal rotation     5/5   Shoulder external rotation     4/5   Middle trapezius        Lower trapezius        Elbow flexion        Elbow extension        Wrist flexion        Wrist extension        Wrist ulnar deviation  Wrist radial deviation        Wrist pronation        Wrist supination        Grip strength (lbs)        (Blank rows = not tested)   SHOULDER SPECIAL TESTS:             12/26/2021: none tested today   JOINT MOBILITY TESTING:  12/26/2021 heavy muscle guarding and pain limited full assessment for Rt shoulder.    PALPATION:  12/26/2021 Tenderness throughout Rt shoulder region in muscles and joint, incision sites.               TODAY'S TREATMENT:  03/20/2022 Therex:  Dwaine Deter fwd/back 3 mins each way lvl 3.0 Pulleys flexion, scaption c eccentric lowering focus Rt arm 2 mins each way  Standing green band ER slow movement focus c towel at side 2 x 10 Standing tband blue gh ext 2 x 10  Standing  Rt shoulder 110 degrees to 0 eccentric lowering only 2 x 10  Supine horizontal abduction green band 2 x 15  Supine bed elevated 20 deg approx. Serratus punch 2 lb 3 sec hold x 10 in approx. 100 deg shoulder flexion Supine bed elevated 20 deg approx.  100 deg shoulder flexion circles small cw, ccw 20 x 2 each 2 lb  Supine ER/IR  end range stretch 5 sec hold x 10 each in 45 deg abduction  Continued education throughout visit for importance of routine HEP use for symptoms and overall improvements.   Consistent cues required during intervention for techniques.  03/08/2022 Therex:  Dwaine Deter fwd/back 3 mins each way lvl 2.5 Pulleys flexion, scaption c eccentric lowering focus Rt arm 3 mins each way  Seated Rt shoulder 90 degrees to 0 eccentric lowering only x 15, x 15 with 1lb weight Supine horizontal abduction band pull bilateral 2 x 10 green band  Supine AROM ER and ER x 5 with tactile cueing for form Standing IR AAROM stretch behind back x 10 with 5sec hold PT education for home exercise pacing and progression to limit sx aggravation with appropriate strengthening, pt verbalized understanding  03/06/2022 Therex:  Pulleys flexion, scaption c eccentric lowering focus Rt arm 3 mins each way   Supine in 20 deg bed elevation - Rt shoulder flexion 2 lbs 2 x 10  Supine 90 deg flexion circles small cw, ccw x 20 each 3 lbs   Sidelying Rt shoulder abduction 1 lb 2 x 20  Sidelying Rt shoulder ER 2 lbs x20, x 15  Standing Rt shoulder 90 degrees to 0 eccentric lowering only 2 x 10  Seated robbery ex (scap retraction c bilateral ER) x15 green band    Cross arm stretch 15 sec x 5 Rt arm  UBE AAROM fwd/back 2 mins each way lvl 2.5   PATIENT EDUCATION: 02/27/2022 Education details: HEP progression Person educated: Patient Education method: Consulting civil engineer, Media planner, Verbal cues, and Handouts Education comprehension: verbalized understanding, returned demonstration, and verbal cues required     HOME EXERCISE PROGRAM: Access Code: [email or web address] URL: [email or web address] Date: 02/27/2022 Prepared by: Faustino Congress   Exercises - Seated Scapular Retraction  - 3-5 x daily - 7 x weekly - 1 sets - 10 reps - 3-5 hold - Supine Shoulder Flexion Extension AAROM with Dowel  - 2-3 x daily - 7 x weekly - 1-2 sets  - 10-15 reps - 3 hold - Supine Shoulder External Rotation with Dowel (Mirrored)  - 2-3 x daily -  7 x weekly - 1 sets - 10 reps - 5 hold - Supine Shoulder Flexion Extension Full Range AROM  - 1-2 x daily - 7 x weekly - 2-3 sets - 10-15 reps - Sidelying Shoulder Abduction Palm Forward  - 1-2 x daily - 7 x weekly - 2-3 sets - 10-15 reps - Shoulder External Rotation and Scapular Retraction with Resistance  - 3-5 x daily - 7 x weekly - 1-2 sets - 10 reps - Standing Shoulder Flexion AAROM with Dowel  - 1-2 x daily - 7 x weekly - 2 sets - 10 reps - Sidelying Shoulder Flexion 15 Degrees (Mirrored)  - 1-2 x daily - 7 x weekly - 3 sets - 10 reps   ASSESSMENT:   CLINICAL IMPRESSION: Medical necessity for continued skilled PT services at this time to address strength for ability to perform lifting of arm against gravity s shrug to facilitate functional use of arm and lightweight lifting/carrying.    OBJECTIVE IMPAIRMENTS decreased activity tolerance, decreased coordination, decreased endurance, decreased mobility, decreased ROM, decreased strength, hypomobility, increased edema, increased fascial restrictions, impaired perceived functional ability, impaired flexibility, impaired UE functional use, improper body mechanics, and pain.    ACTIVITY LIMITATIONS carrying, lifting, bed mobility, bathing, toileting, dressing, reach over head, hygiene/grooming, and locomotion level   PARTICIPATION LIMITATIONS: meal prep, cleaning, laundry, interpersonal relationship, community activity, occupation, and yard work   PERSONAL FACTORS  Chronic back pain, CKD, COPD, CAD, depression, DM, GERD  are also affecting patient's functional outcome.    REHAB POTENTIAL: Good   CLINICAL DECISION MAKING: Stable/uncomplicated   EVALUATION COMPLEXITY: Low     GOALS: Goals reviewed with patient? Yes   Short term PT Goals (target date for Short term goals are 3 weeks 01/16/2022) Patient will demonstrate independent use of home  exercise program to maintain progress from in clinic treatments. Goal status: MET 01/16/22   Long term PT goals (target dates for all long term goals are 4 weeks  04/05/2022 )   1. Patient will demonstrate/report pain at worst less than or equal to 2/10 to facilitate minimal limitation in daily activity secondary to pain symptoms. Goal status: revised - 03/08/2022   2. Patient will demonstrate independent use of home exercise program to facilitate ability to maintain/progress functional gains from skilled physical therapy services. Goal status: revised - 03/08/2022   3. Patient will demonstrate FOTO outcome > or = 52 % to indicate reduced disability due to condition. Goal status: MET 03/20/2022   4.  Patient will demonstrate Rt Gene Autry joint mobility WFL to facilitate usual self care, dressing, reaching overhead at PLOF s limitation due to symptoms.  Goal status: revised - 03/08/2022   5.  Patient will demonstrate Rt UE MMT 4/5 or greater throughout to facilitate usual lifting, carrying in functional activity to PLOF s limitation.   Goal status: revised - 03/08/2022   6.  Patient will demonstrate ability to sleep s restriction due to symptom.  Goal status: revised - 03/08/2022     PLAN: PT FREQUENCY:  1x/week   PT DURATION: 4 weeks   PLANNED INTERVENTIONS: Therapeutic exercises, Therapeutic activity, Neuro Muscular re-education, Balance training, Gait training, Patient/Family education, Joint mobilization, Stair training, DME instructions, Dry Needling, Electrical stimulation, Cryotherapy, Moist heat, Taping, Ultrasound, Ionotophoresis 70m/ml Dexamethasone, and Manual therapy.  All included unless contraindicated   PLAN FOR NEXT SESSION: Progressive strengthening against gravity as tolerated.   MScot Jun PT, DPT, OCS, ATC 03/20/22  12:20 PM

## 2022-03-21 DIAGNOSIS — Z9861 Coronary angioplasty status: Secondary | ICD-10-CM | POA: Diagnosis not present

## 2022-03-21 DIAGNOSIS — I2510 Atherosclerotic heart disease of native coronary artery without angina pectoris: Secondary | ICD-10-CM | POA: Diagnosis not present

## 2022-03-21 DIAGNOSIS — I10 Essential (primary) hypertension: Secondary | ICD-10-CM | POA: Diagnosis not present

## 2022-03-21 DIAGNOSIS — E785 Hyperlipidemia, unspecified: Secondary | ICD-10-CM | POA: Diagnosis not present

## 2022-03-22 ENCOUNTER — Other Ambulatory Visit: Payer: Self-pay | Admitting: Physician Assistant

## 2022-03-22 ENCOUNTER — Encounter: Payer: Self-pay | Admitting: Hematology

## 2022-03-22 ENCOUNTER — Encounter: Payer: Self-pay | Admitting: Cardiovascular Disease

## 2022-03-22 ENCOUNTER — Other Ambulatory Visit: Payer: Self-pay

## 2022-03-22 DIAGNOSIS — I25118 Atherosclerotic heart disease of native coronary artery with other forms of angina pectoris: Secondary | ICD-10-CM

## 2022-03-22 DIAGNOSIS — K219 Gastro-esophageal reflux disease without esophagitis: Secondary | ICD-10-CM

## 2022-03-22 LAB — LIPID PANEL
Chol/HDL Ratio: 3 ratio (ref 0.0–4.4)
Cholesterol, Total: 229 mg/dL — ABNORMAL HIGH (ref 100–199)
HDL: 76 mg/dL (ref >39)
LDL Chol Calc (NIH): 136 mg/dL — ABNORMAL HIGH (ref 0–99)
Triglycerides: 99 mg/dL (ref 0–149)
VLDL Cholesterol Cal: 17 mg/dL (ref 5–40)

## 2022-03-22 LAB — LIPOPROTEIN A (LPA): Lipoprotein (a): 56.2 nmol/L (ref <75.0)

## 2022-03-22 MED ORDER — OMEPRAZOLE 40 MG PO CPDR
40.0000 mg | DELAYED_RELEASE_CAPSULE | Freq: Two times a day (BID) | ORAL | 2 refills | Status: DC
  Filled 2022-03-22: qty 60, 30d supply, fill #0
  Filled 2022-04-23: qty 60, 30d supply, fill #1
  Filled 2022-05-22: qty 60, 30d supply, fill #2

## 2022-03-22 MED ORDER — ATORVASTATIN CALCIUM 80 MG PO TABS
80.0000 mg | ORAL_TABLET | Freq: Every day | ORAL | 3 refills | Status: DC
  Filled 2022-03-22: qty 90, 90d supply, fill #0
  Filled 2022-06-13: qty 90, 90d supply, fill #1

## 2022-03-22 NOTE — Telephone Encounter (Signed)
LDL cholesterol is high, roughly twice was it was a year ago.  It appears that she has stopped taking her atorvastatin.  Since Mrs. Lavoy has CAD and has received previous stents, target LDL is less than 70.  LP(a) is normal. Please restart atorvastatin 80 mg once daily if this has been inadvertently stopped.  If stopped due to side effects, please ask for details regarding those side effects.  Written by Sanda Klein, MD on 03/22/2022  9:35 AM EDT Seen by proxy Silvano Rusk on 03/22/2022  9:36 AM  Prescription for Atorvastatin '80mg'$  daily sent to patient preferred pharmacy.

## 2022-03-25 ENCOUNTER — Ambulatory Visit (HOSPITAL_COMMUNITY): Payer: Medicare Other | Attending: Cardiovascular Disease

## 2022-03-25 DIAGNOSIS — R0602 Shortness of breath: Secondary | ICD-10-CM | POA: Diagnosis not present

## 2022-03-25 DIAGNOSIS — I10 Essential (primary) hypertension: Secondary | ICD-10-CM | POA: Diagnosis not present

## 2022-03-25 DIAGNOSIS — Z9861 Coronary angioplasty status: Secondary | ICD-10-CM | POA: Insufficient documentation

## 2022-03-25 DIAGNOSIS — I2510 Atherosclerotic heart disease of native coronary artery without angina pectoris: Secondary | ICD-10-CM | POA: Insufficient documentation

## 2022-03-25 DIAGNOSIS — E785 Hyperlipidemia, unspecified: Secondary | ICD-10-CM | POA: Diagnosis not present

## 2022-03-25 LAB — ECHOCARDIOGRAM COMPLETE
Area-P 1/2: 3.77 cm2
S' Lateral: 3 cm

## 2022-03-26 ENCOUNTER — Ambulatory Visit: Payer: Medicare Other | Admitting: Gastroenterology

## 2022-03-27 ENCOUNTER — Encounter: Payer: Medicare Other | Admitting: Rehabilitative and Restorative Service Providers"

## 2022-03-27 ENCOUNTER — Telehealth: Payer: Self-pay | Admitting: Rehabilitative and Restorative Service Providers"

## 2022-03-27 NOTE — Telephone Encounter (Signed)
Called patient after 15 mins no show for appointment today. Pt indicated she was confused about the day of appointment and forgot.  Will plan to call back to reschedule.  Scot Jun, PT, DPT, OCS, ATC 03/27/22  12:03 PM

## 2022-04-02 ENCOUNTER — Encounter: Payer: Medicare Other | Admitting: Rehabilitative and Restorative Service Providers"

## 2022-04-10 ENCOUNTER — Ambulatory Visit (INDEPENDENT_AMBULATORY_CARE_PROVIDER_SITE_OTHER): Payer: Medicare Other | Admitting: Physical Therapy

## 2022-04-10 ENCOUNTER — Encounter: Payer: Self-pay | Admitting: Physical Therapy

## 2022-04-10 DIAGNOSIS — M25511 Pain in right shoulder: Secondary | ICD-10-CM

## 2022-04-10 DIAGNOSIS — R600 Localized edema: Secondary | ICD-10-CM | POA: Diagnosis not present

## 2022-04-10 DIAGNOSIS — M6281 Muscle weakness (generalized): Secondary | ICD-10-CM

## 2022-04-10 DIAGNOSIS — G8929 Other chronic pain: Secondary | ICD-10-CM

## 2022-04-10 DIAGNOSIS — M25611 Stiffness of right shoulder, not elsewhere classified: Secondary | ICD-10-CM | POA: Diagnosis not present

## 2022-04-10 NOTE — Therapy (Signed)
OUTPATIENT PHYSICAL THERAPY TREATMENT NOTE & PROGRESS NOTE /RECERT   Patient Name: MARSHAWN NINNEMAN MRN: [id number] DOB:02-19-1956, 66 y.o., female Today's Date: 04/10/2022  Progress Note Reporting Period 01/30/2022 to 03/08/2022  See note below for Objective Data and Assessment of Progress/Goals.     END OF SESSION:   PT End of Session - 04/10/22 1117     Visit Number 22    Number of Visits 24    Date for PT Re-Evaluation 04/05/22    Authorization Type Medicare - kx after 15    Progress Note Due on Visit 30    PT Start Time 1058    PT Stop Time 1140    PT Time Calculation (min) 42 min    Activity Tolerance Patient tolerated treatment well    Behavior During Therapy WFL for tasks assessed/performed               Past Medical History:  Diagnosis Date   Allergy    seasonal   Anemia    Anxiety    takes Lexapro daily   Arthritis    "back, from neck down pass my bra area" (03/25/2017)   Asthma    Bartholin gland cyst 08/29/2011   Bruises easily    pt is on Effient   Chronic back pain    herniated nucleus pulposus   Chronic back pain    "neck to bra area; lower back" (03/25/2017)   Chronic kidney disease    recurrent UTI's this year 2022   COPD (chronic obstructive pulmonary disease) (HCC)    early stages   Coronary artery disease    Depression    takes Klonopin daily   Diabetes mellitus without complication ([place])    Diverticulosis    Fibroadenoma of left breast    GERD (gastroesophageal reflux disease)    takes Nexium daily   H/O hiatal hernia    Heart attack ([place]) 2011   Hemorrhoids    Hernia    Hyperlipidemia    takes Lipitor daily   Hypertension    takes Losartan daily and Labetalol bid   Hypothyroidism    takes Synthroid daily   Insomnia    hydroxyzine prn   Joint pain    Pneumonia    "couple times" (03/25/2017)   Pre-diabetes    "just found out 1 wk ago" (03/25/2017)   Psoriasis    elbows,knees,back   Shortness of breath    with exertion    Slowing of urinary stream    Stress incontinence    Past Surgical History:  Procedure Laterality Date   [place]   "left one of my ovaries"   BACK SURGERY     BIOPSY  05/27/2019   Procedure: BIOPSY;  Surgeon: Daneil Dolin, MD;  Location: [facility];  Service: Endoscopy;;   COLONOSCOPY  2011   Dr. Ardis Hughs: Mild diverticulosis, descending diminutive colon polyp (not retrieved), next colonoscopy 10 years   CORONARY ANGIOPLASTY WITH STENT PLACEMENT  2011 X2   "regular stents didn't work; had to go back in in ~ 1 month and put in medicated stents"   DILATION AND CURETTAGE OF UTERUS     ESOPHAGOGASTRODUODENOSCOPY     ESOPHAGOGASTRODUODENOSCOPY (EGD) WITH PROPOFOL N/A 05/27/2019   normal esophagus, dilation, erosive gastropathy s/p biopsy, normal duodenum. Negative H.pylori.    LEFT HEART CATH AND CORONARY ANGIOGRAPHY N/A 03/26/2017   Procedure: LEFT HEART CATH AND CORONARY ANGIOGRAPHY;  Surgeon: Daneen Schick  W, MD;  Location: [facility];  Service: Cardiovascular;  Laterality: N/A;   LUMBAR LAMINECTOMY/DECOMPRESSION MICRODISCECTOMY  08/05/2011   Procedure: LUMBAR LAMINECTOMY/DECOMPRESSION MICRODISCECTOMY;  Surgeon: Otilio Connors, MD;  Location: [facility];  Service: Neurosurgery;  Laterality: Right;  Right Lumbar four-five extraforaminal discectomy   MALONEY DILATION N/A 05/27/2019   Procedure: Venia Minks DILATION;  Surgeon: Daneil Dolin, MD;  Location: [facility];  Service: Endoscopy;  Laterality: N/A;  54   SHOULDER ARTHROSCOPY WITH ROTATOR CUFF REPAIR AND OPEN BICEPS TENODESIS Right 12/24/2021   Procedure: RIGHT SHOULDER ARTHROSCOPY WITH ROTATOR CUFF REPAIR AND BICEPS TENODESIS;  Surgeon: Marybelle Killings, MD;  Location: [facility];  Service: Orthopedics;  Laterality: Right;   TONSILLECTOMY     as a child   UPPER GASTROINTESTINAL ENDOSCOPY     Patient Active Problem List   Diagnosis Date Noted   Nontraumatic complete  tear of right rotator cuff    Superior glenoid labrum lesion of left shoulder    Iron deficiency anemia 11/02/2021   Major depressive disorder, recurrent, in full remission with anxious distress ([place]) 05/31/2021   Rotator cuff tendinitis, right 05/08/2021   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   Chronic neck pain 02/26/2021   GAD (generalized anxiety disorder) 01/08/2021   Acute cystitis without hematuria 11/07/2020   Influenza vaccine needed 06/15/2020   Abdominal pain, epigastric 04/28/2019   Esophageal dysphagia 04/28/2019   Bipolar I disorder ([place]) 09/17/2018   Insomnia 10/28/2017   Prediabetes 05/28/2017   QT prolongation 03/25/2017   Encounter for screening mammogram for breast cancer 09/11/2015   Psoriasis 06/22/2015   COPD (chronic obstructive pulmonary disease) (HCC)GOLD E 05/18/2015   Anxiety and depression 07/10/2013   Essential hypertension, benign 06/07/2013   Dyslipidemia 06/07/2013   Asthma, chronic 06/07/2013   Emphysema lung ([place]) 06/07/2013   Chronic back pain 06/07/2013   CAD S/P percutaneous coronary angioplasty 06/07/2013   GERD (gastroesophageal reflux disease) 06/07/2013   Breast lump on left side at 3 o'clock position 10/13/2012   S/P abdominal hysterectomy and right salpingo-oophorectomy 08/29/2011   TOBACCO ABUSE 07/17/2009   Chronic rhinitis 07/17/2009   Lung nodule < 6cm on CT 07/17/2009   ALLERGY, FOOD 07/17/2009   Hypothyroidism 12/22/2008     THERAPY DIAG:  Chronic right shoulder pain  Muscle weakness (generalized)  Stiffness of right shoulder, not elsewhere classified  Localized edema  PCP: Troy Sine MD   REFERRING PROVIDER: Marybelle Killings, MD   REFERRING DIAG: M75.81 (ICD-10-CM) - Rotator cuff tendinitis, right   Rationale for Evaluation and Treatment Rehabilitation   ONSET DATE: Surgery 12/24/2021   SUBJECTIVE:  SUBJECTIVE STATEMENT: Pt states that her friend fell and hit her arm last week. She states between that and this weather she is more pain than usual today.  PERTINENT HISTORY: Chronic back pain, CKD, COPD, CAD, depression, DM, GERD   PAIN:  NPRS scale: 0/10 upon arrival.  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    OBJECTIVE:    PATIENT SURVEYS:  03/20/2022:  FOTO update 59  02/13/2022:  FOTO update:   48  12/26/2021: FOTO intake:  20  predicted:  52   POSTURE: 12/26/2021 Rounded shoulder, forward head posture.  Rt arm in sling upon arrival              Localized edema Rt shoulder visibly    UPPER EXTREMITY ROM:    ROM Right 12/26/2021 Passive in supine - All limited by pain Right 01/02/2022  PROM - limited by pain response Right 01/09/2022  PROM supine PROM Right 01/11/2022 assessed supine 01/18/2022 PROM in supine 01/25/2022: PROM in supine 02/01/2022: AAROM 03/08/2022: AROM  Shoulder flexion 10 100 in supine 125 in spuine  115 supine 125 supine 125 supine 145 supine 160 supine  Shoulder extension           Shoulder abduction 5 90 in supine    99 in supine  128 supine  Shoulder adduction           Shoulder internal rotation To belly With arm at side   55 degrees assessed at 70 degrees abduction    64 degrees assessed at 70 degrees abduction  Shoulder external rotation -20 With arm at side 0 in 30 deg abduction  42 in 30 deg abduction supine 50 degrees assessed at 70  degrees abduction 53 degrees assessed at 70 degrees abduction 50 degrees assessed at 60 degrees abduction  55 degrees assessed at 70 degrees abduction  Elbow flexion WFL passively         Elbow extension -20 deg c pain in shoulder passively         (Blank rows = not tested)   UPPER EXTREMITY MMT:                         12/26/2021:  not tested due to protocol for surgery - Rt shoulder MMT Right 02/08/2022   Right 02/20/2022   Right 02/27/2022 Right 03/06/2022  Shoulder flexion  2+/5  3/5 - shrug noted against gravity 3+/5 c shrug noted 3+/5 c shrug against gravity  Shoulder extension        Shoulder abduction        Shoulder adduction        Shoulder internal rotation     5/5   Shoulder external rotation     4/5   Middle trapezius        Lower trapezius        Elbow flexion        Elbow extension        Wrist flexion        Wrist extension        Wrist ulnar  deviation        Wrist radial deviation        Wrist pronation        Wrist supination        Grip strength (lbs)        (Blank rows = not tested)   SHOULDER SPECIAL TESTS:             12/26/2021: none tested today   JOINT MOBILITY TESTING:  12/26/2021 heavy muscle guarding and pain limited full assessment for Rt shoulder.    PALPATION:  12/26/2021 Tenderness throughout Rt shoulder region in muscles and joint, incision sites.               TODAY'S TREATMENT:  04/10/2022:  Therex:  UBE AAROM fwd/back 3 mins each way lvl 3.0 Pulleys flexion, scaption c eccentric lowering focus Rt arm 2 mins each way  Standing green band rows 2x10  Standing green band ER slow movement focus c towel at side 2 x 10 Standing tband blue gh ext 2 x 10  OH reaches with 2# ball 2x10  Standing  Rt shoulder 110 degrees to 0 eccentric lowering only 2 x 10  Supine horizontal abduction green band 2 x 15  Supine bed elevated 20 deg approx. Serratus punch 2 lb 3 sec hold x 10 in approx. 100 deg shoulder flexion Supine OH reaches with 3# bar x 30   Supine bed elevated 20 deg approx.  100 deg shoulder flexion circles small cw, ccw 20 x 2 each 2 lb  Supine ER/IR end range stretch 5 sec hold x 10 each in 45 deg abduction Seated scap retraction x20 for postural control x30   Continued education throughout visit for importance of routine HEP use for symptoms and overall improvements.   Consistent cues re  03/20/2022 Therex:  UBE AAROM fwd/back 3 mins each way lvl 3.0 Pulleys flexion, scaption c eccentric lowering focus Rt arm 2 mins each way  Standing green band ER slow movement focus c towel at side 2 x 10 Standing tband blue gh ext 2 x 10  Standing  Rt shoulder 110 degrees to 0 eccentric lowering only 2 x 10  Supine horizontal abduction green band 2 x 15  Supine bed elevated 20 deg approx. Serratus punch 2 lb 3 sec hold x 10 in approx. 100 deg shoulder flexion Supine bed elevated 20 deg approx.  100 deg shoulder flexion circles small cw, ccw 20 x 2 each 2 lb  Supine ER/IR end range stretch 5 sec hold x 10 each in 45 deg abduction  Continued education throughout visit for importance of routine HEP use for symptoms and overall improvements.   Consistent cues required during intervention for techniques.  03/08/2022 Therex:  Dwaine Deter fwd/back 3 mins each way lvl 2.5 Pulleys flexion, scaption c eccentric lowering focus Rt arm 3 mins each way  Seated Rt shoulder 90 degrees to 0 eccentric lowering only x 15, x 15 with 1lb weight Supine horizontal abduction band pull bilateral 2 x 10 green band  Supine AROM ER and ER x 5 with tactile cueing for form Standing IR AAROM stretch behind back x 10 with 5sec hold PT education for home exercise pacing and progression to limit sx aggravation with appropriate strengthening, pt verbalized understanding  03/06/2022 Therex:  Pulleys flexion, scaption c eccentric lowering focus Rt arm 3 mins each way   Supine in 20 deg bed elevation - Rt shoulder flexion 2 lbs 2 x 10  Supine 90  deg flexion circles  small cw, ccw x 20 each 3 lbs   Sidelying Rt shoulder abduction 1 lb 2 x 20  Sidelying Rt shoulder ER 2 lbs x20, x 15  Standing Rt shoulder 90 degrees to 0 eccentric lowering only 2 x 10  Seated robbery ex (scap retraction c bilateral ER) x15 green band    Cross arm stretch 15 sec x 5 Rt arm  UBE AAROM fwd/back 2 mins each way lvl 2.5   PATIENT EDUCATION: 02/27/2022 Education details: HEP progression Person educated: Patient Education method: Consulting civil engineer, Media planner, Verbal cues, and Handouts Education comprehension: verbalized understanding, returned demonstration, and verbal cues required     HOME EXERCISE PROGRAM: Access Code: [email or web address] URL: [email or web address] Date: 02/27/2022 Prepared by: Faustino Congress   Exercises - Seated Scapular Retraction  - 3-5 x daily - 7 x weekly - 1 sets - 10 reps - 3-5 hold - Supine Shoulder Flexion Extension AAROM with Dowel  - 2-3 x daily - 7 x weekly - 1-2 sets - 10-15 reps - 3 hold - Supine Shoulder External Rotation with Dowel (Mirrored)  - 2-3 x daily - 7 x weekly - 1 sets - 10 reps - 5 hold - Supine Shoulder Flexion Extension Full Range AROM  - 1-2 x daily - 7 x weekly - 2-3 sets - 10-15 reps - Sidelying Shoulder Abduction Palm Forward  - 1-2 x daily - 7 x weekly - 2-3 sets - 10-15 reps - Shoulder External Rotation and Scapular Retraction with Resistance  - 3-5 x daily - 7 x weekly - 1-2 sets - 10 reps - Standing Shoulder Flexion AAROM with Dowel  - 1-2 x daily - 7 x weekly - 2 sets - 10 reps - Sidelying Shoulder Flexion 15 Degrees (Mirrored)  - 1-2 x daily - 7 x weekly - 3 sets - 10 reps   ASSESSMENT:   CLINICAL IMPRESSION: Medical necessity for continued skilled PT services at this time to address strength for ability to perform lifting of arm against gravity s shrug to facilitate functional use of arm and lightweight lifting/carrying. Pt tolerated all exercises well today, but continues to require constant cues due  to compensation with R UE.   OBJECTIVE IMPAIRMENTS decreased activity tolerance, decreased coordination, decreased endurance, decreased mobility, decreased ROM, decreased strength, hypomobility, increased edema, increased fascial restrictions, impaired perceived functional ability, impaired flexibility, impaired UE functional use, improper body mechanics, and pain.    ACTIVITY LIMITATIONS carrying, lifting, bed mobility, bathing, toileting, dressing, reach over head, hygiene/grooming, and locomotion level   PARTICIPATION LIMITATIONS: meal prep, cleaning, laundry, interpersonal relationship, community activity, occupation, and yard work   PERSONAL FACTORS  Chronic back pain, CKD, COPD, CAD, depression, DM, GERD  are also affecting patient's functional outcome.    REHAB POTENTIAL: Good   CLINICAL DECISION MAKING: Stable/uncomplicated   EVALUATION COMPLEXITY: Low     GOALS: Goals reviewed with patient? Yes   Short term PT Goals (target date for Short term goals are 3 weeks 01/16/2022) Patient will demonstrate independent use of home exercise program to maintain progress from in clinic treatments. Goal status: MET 01/16/22   Long term PT goals (target dates for all long term goals are 4 weeks  04/05/2022 )   1. Patient will demonstrate/report pain at worst less than or equal to 2/10 to facilitate minimal limitation in daily activity secondary to pain symptoms. Goal status: revised - 03/08/2022   2. Patient will demonstrate independent use of home exercise program to facilitate ability to  maintain/progress functional gains from skilled physical therapy services. Goal status: revised - 03/08/2022   3. Patient will demonstrate FOTO outcome > or = 52 % to indicate reduced disability due to condition. Goal status: MET 03/20/2022   4.  Patient will demonstrate Rt [place] joint mobility WFL to facilitate usual self care, dressing, reaching overhead at PLOF s limitation due to symptoms.  Goal status:  revised - 03/08/2022   5.  Patient will demonstrate Rt UE MMT 4/5 or greater throughout to facilitate usual lifting, carrying in functional activity to PLOF s limitation.   Goal status: revised - 03/08/2022   6.  Patient will demonstrate ability to sleep s restriction due to symptom.  Goal status: revised - 03/08/2022     PLAN: PT FREQUENCY:  1x/week   PT DURATION: 4 weeks   PLANNED INTERVENTIONS: Therapeutic exercises, Therapeutic activity, Neuro Muscular re-education, Balance training, Gait training, Patient/Family education, Joint mobilization, Stair training, DME instructions, Dry Needling, Electrical stimulation, Cryotherapy, Moist heat, Taping, Ultrasound, Ionotophoresis 75m/ml Dexamethasone, and Manual therapy.  All included unless contraindicated   PLAN FOR NEXT SESSION: Progressive strengthening against gravity as tolerated.   SRudi HeapPT, DPT 04/10/22  11:20 AM

## 2022-04-15 ENCOUNTER — Other Ambulatory Visit: Payer: Self-pay

## 2022-04-15 NOTE — Therapy (Addendum)
OUTPATIENT PHYSICAL THERAPY TREATMENT NOTE & PROGRESS NOTE Jennye Moccasin   Patient Name: [name] [name] MRN: [id number] DOB:[birth year], 66 y.o., female Today's Date: 04/17/2022  Progress Note Reporting Period 01/30/2022 to 03/08/2022  See note below for Objective Data and Assessment of Progress/Goals.     END OF SESSION:   PT End of Session - 04/17/22 1440     Visit Number 23    Number of Visits 24    Date for PT Re-Evaluation 04/05/22    Authorization Type Medicare - kx after 15    Progress Note Due on Visit 30    PT Start Time 0240    PT Stop Time 1520    PT Time Calculation (min) 760 min    Activity Tolerance Patient tolerated treatment well    Behavior During Therapy WFL for tasks assessed/performed                Past Medical History:  Diagnosis Date   Allergy    seasonal   Anemia    Anxiety    takes Lexapro daily   Arthritis    "back, from neck down pass my bra area" (03/25/2017)   Asthma    Bartholin gland cyst 08/29/2011   Bruises easily    pt is on Effient   Chronic back pain    herniated nucleus pulposus   Chronic back pain    "neck to bra area; lower back" (03/25/2017)   Chronic kidney disease    recurrent UTI's this year 2022   COPD (chronic obstructive pulmonary disease) (HCC)    early stages   Coronary artery disease    Depression    takes Klonopin daily   Diabetes mellitus without complication ([place])    Diverticulosis    Fibroadenoma of left breast    GERD (gastroesophageal reflux disease)    takes Nexium daily   H/O hiatal hernia    Heart attack ([place]) 2011   Hemorrhoids    Hernia    Hyperlipidemia    takes Lipitor daily   Hypertension    takes Losartan daily and Labetalol bid   Hypothyroidism    takes Synthroid daily   Insomnia    hydroxyzine prn   Joint pain    Pneumonia    "couple times" (03/25/2017)   Pre-diabetes    "just found out 1 wk ago" (03/25/2017)   Psoriasis    elbows,knees,back   Shortness of breath     with exertion   Slowing of urinary stream    Stress incontinence    Past Surgical History:  Procedure Laterality Date   Dana   "left one of my ovaries"   BACK SURGERY     BIOPSY  05/27/2019   Procedure: BIOPSY;  Surgeon: Daneil Dolin, MD;  [facility];  Service: Endoscopy;;   COLONOSCOPY  2011   Dr. Ardis Hughs: Mild diverticulosis, descending diminutive colon polyp (not retrieved), next colonoscopy 10 years   CORONARY ANGIOPLASTY WITH STENT PLACEMENT  2011 X2   "regular stents didn't work; had to go back in in ~ 1 month and put in medicated stents"   DILATION AND CURETTAGE OF UTERUS     ESOPHAGOGASTRODUODENOSCOPY     ESOPHAGOGASTRODUODENOSCOPY (EGD) WITH PROPOFOL N/A 05/27/2019   normal esophagus, dilation, erosive gastropathy s/p biopsy, normal duodenum. Negative H.pylori.    LEFT HEART CATH AND CORONARY ANGIOGRAPHY N/A 03/26/2017   Procedure: LEFT HEART CATH AND CORONARY ANGIOGRAPHY;  Surgeon: Belva Crome, MD;  Location: [place] [facility];  Service: Cardiovascular;  Laterality: N/A;   LUMBAR LAMINECTOMY/DECOMPRESSION MICRODISCECTOMY  08/05/2011   Procedure: LUMBAR LAMINECTOMY/DECOMPRESSION MICRODISCECTOMY;  Surgeon: Otilio Connors, MD;  Location: [place];  Service: Neurosurgery;  Laterality: Right;  Right Lumbar four-five extraforaminal discectomy   MALONEY DILATION N/A 05/27/2019   Procedure: Venia Minks DILATION;  Surgeon: Daneil Dolin, MD;  [facility];  Service: Endoscopy;  Laterality: N/A;  54   SHOULDER ARTHROSCOPY WITH ROTATOR CUFF REPAIR AND OPEN BICEPS TENODESIS Right 12/24/2021   Procedure: RIGHT SHOULDER ARTHROSCOPY WITH ROTATOR CUFF REPAIR AND BICEPS TENODESIS;  Surgeon: Marybelle Killings, MD;  Location: [facility] [place];  Service: Orthopedics;  Laterality: Right;   TONSILLECTOMY     as a child   UPPER GASTROINTESTINAL ENDOSCOPY     Patient Active Problem List   Diagnosis Date Noted    Nontraumatic complete tear of right rotator cuff    Superior glenoid labrum lesion of left shoulder    Iron deficiency anemia 11/02/2021   Major depressive disorder, recurrent, in full remission with anxious distress ([place]) 05/31/2021   Rotator cuff tendinitis, right 05/08/2021   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   Chronic neck pain 02/26/2021   GAD (generalized anxiety disorder) 01/08/2021   Acute cystitis without hematuria 11/07/2020   Influenza vaccine needed 06/15/2020   Abdominal pain, epigastric 04/28/2019   Esophageal dysphagia 04/28/2019   Bipolar I disorder ([place]) 09/17/2018   Insomnia 10/28/2017   Prediabetes 05/28/2017   QT prolongation 03/25/2017   Encounter for screening mammogram for breast cancer 09/11/2015   Psoriasis 06/22/2015   COPD (chronic obstructive pulmonary disease) (HCC)GOLD E 05/18/2015   Anxiety and depression 07/10/2013   Essential hypertension, benign 06/07/2013   Dyslipidemia 06/07/2013   Asthma, chronic 06/07/2013   Emphysema lung ([place]) 06/07/2013   Chronic back pain 06/07/2013   CAD S/P percutaneous coronary angioplasty 06/07/2013   GERD (gastroesophageal reflux disease) 06/07/2013   Breast lump on left side at 3 o'clock position 10/13/2012   S/P abdominal hysterectomy and right salpingo-oophorectomy 08/29/2011   TOBACCO ABUSE 07/17/2009   Chronic rhinitis 07/17/2009   Lung nodule < 6cm on CT 07/17/2009   ALLERGY, FOOD 07/17/2009   Hypothyroidism 12/22/2008     THERAPY DIAG:  Chronic right shoulder pain  Muscle weakness (generalized)  Stiffness of right shoulder, not elsewhere classified  Localized edema  PCP: Troy Sine MD   REFERRING PROVIDER: Marybelle Killings, MD   REFERRING DIAG: M75.81 (ICD-10-CM) - Rotator cuff tendinitis, right   Rationale for Evaluation and Treatment Rehabilitation   ONSET DATE: Surgery 12/24/2021   SUBJECTIVE:  SUBJECTIVE STATEMENT: Pt states that her friend fell and hit her arm last week. She states between that and this weather she is more pain than usual today.  PERTINENT HISTORY: Chronic back pain, CKD, COPD, CAD, depression, DM, GERD   PAIN:  NPRS scale: 0/10 upon arrival.  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    OBJECTIVE:    PATIENT SURVEYS:  03/20/2022:  FOTO update 59  02/13/2022:  FOTO update:   48  12/26/2021: FOTO intake:  20  predicted:  52   POSTURE: 12/26/2021 Rounded shoulder, forward head posture.  Rt arm in sling upon arrival              Localized edema Rt shoulder visibly    UPPER EXTREMITY ROM:    ROM Right 12/26/2021 Passive in supine - All limited by pain Right 01/02/2022  PROM - limited by pain response Right 01/09/2022  PROM supine PROM Right 01/11/2022 assessed supine 01/18/2022 PROM in supine 01/25/2022: PROM in supine 02/01/2022: AAROM 03/08/2022: AROM 04/17/2022: AROM  Shoulder flexion 10 100 in supine 125 in spuine  115 supine 125 supine 125 supine 145 supine 160 supine 163 supine  Shoulder extension            Shoulder abduction 5 90 in supine    99 in supine  128 supine 132 supine  Shoulder adduction            Shoulder internal rotation To belly With arm at side   55 degrees assessed at 70 degrees abduction    64 degrees assessed at 70 degrees abduction 70 degrees assessed at 70 degrees abduction  Shoulder external  rotation -20 With arm at side 0 in 30 deg abduction  42 in 30 deg abduction supine 50 degrees assessed at 70 degrees abduction 53 degrees assessed at 70 degrees abduction 50 degrees assessed at 60 degrees abduction  55 degrees assessed at 70 degrees abduction 63 degrees assessed at 70 degrees abduction  Elbow flexion WFL passively          Elbow extension -20 deg c pain in shoulder passively          (Blank rows = not tested)   UPPER EXTREMITY MMT:                         12/26/2021:  not tested due to protocol for surgery - Rt shoulder MMT Right 02/08/2022   Right 02/20/2022   Right 02/27/2022 Right 03/06/2022 Right  04/17/2022  Shoulder flexion  2+/5  3/5 - shrug noted against gravity 3+/5 c shrug noted 3+/5 c shrug against gravity 3+/5 c shrug against gravity  Shoulder extension         Shoulder abduction         Shoulder adduction         Shoulder internal rotation     5/5  4/5  Shoulder external rotation     4/5  4/5  Middle trapezius         Lower trapezius  Elbow flexion         Elbow extension         Wrist flexion         Wrist extension         Wrist ulnar deviation         Wrist radial deviation         Wrist pronation         Wrist supination         Grip strength (lbs)         (Blank rows = not tested)   SHOULDER SPECIAL TESTS:             12/26/2021: none tested today   JOINT MOBILITY TESTING:  12/26/2021 heavy muscle guarding and pain limited full assessment for Rt shoulder.    PALPATION:  12/26/2021 Tenderness throughout Rt shoulder region in muscles and joint, incision sites.               TODAY'S TREATMENT:  04/17/2022:  Therex:  UBE AAROM fwd/back 3 mins each way lvl 3.0 Standing green band rows and extensions  2x10  Standing green band ER and IR slow movement focus c towel at side 2 x 10 OH reaches with 2# ball 2x10  Standing  Rt shoulder 110 degrees to 0 eccentric lowering only 2 x 10  Standing horizontal abduction green band 2 x 15 (increased  difficulty)  Supine serratus punches 2# 2x10 Supine OH reaches with 3# bar x 30  Supine bed elevated 20 deg approx.  100 deg shoulder flexion circles small cw, ccw 20 x 2 each 2 lb  Supine ER/IR end range stretch 5 sec hold x 10 each in 45 deg abduction Seated scap retraction x20 for postural control x30  Side lying ER with #4 2x 10   Continued education throughout visit for importance of routine HEP use for symptoms and overall improvements. Consistent cues re  04/10/2022:  Therex:  UBE AAROM fwd/back 3 mins each way lvl 3.0 Pulleys flexion, scaption c eccentric lowering focus Rt arm 2 mins each way  Standing green band rows 2x10  Standing green band ER slow movement focus c towel at side 2 x 10 Standing tband blue gh ext 2 x 10  OH reaches with 2# ball 2x10  Standing  Rt shoulder 110 degrees to 0 eccentric lowering only 2 x 10  Supine horizontal abduction green band 2 x 15  Supine bed elevated 20 deg approx. Serratus punch 2 lb 3 sec hold x 10 in approx. 100 deg shoulder flexion Supine OH reaches with 3# bar x 30  Supine bed elevated 20 deg approx.  100 deg shoulder flexion circles small cw, ccw 20 x 2 each 2 lb  Supine ER/IR end range stretch 5 sec hold x 10 each in 45 deg abduction Seated scap retraction x20 for postural control x30   Continued education throughout visit for importance of routine HEP use for symptoms and overall improvements.   Consistent cues re  03/20/2022 Therex:  UBE AAROM fwd/back 3 mins each way lvl 3.0 Pulleys flexion, scaption c eccentric lowering focus Rt arm 2 mins each way  Standing green band ER slow movement focus c towel at side 2 x 10 Standing tband blue gh ext 2 x 10  Standing  Rt shoulder 110 degrees to 0 eccentric lowering only 2 x 10  Supine horizontal abduction green band 2 x 15  Supine bed elevated 20 deg approx. Serratus punch 2 lb 3  sec hold x 10 in approx. 100 deg shoulder flexion Supine bed elevated 20 deg approx.  100 deg shoulder  flexion circles small cw, ccw 20 x 2 each 2 lb  Supine ER/IR end range stretch 5 sec hold x 10 each in 45 deg abduction  Continued education throughout visit for importance of routine HEP use for symptoms and overall improvements.   Consistent cues required during intervention for techniques.  03/08/2022 Therex:  Dwaine Deter fwd/back 3 mins each way lvl 2.5 Pulleys flexion, scaption c eccentric lowering focus Rt arm 3 mins each way  Seated Rt shoulder 90 degrees to 0 eccentric lowering only x 15, x 15 with 1lb weight Supine horizontal abduction band pull bilateral 2 x 10 green band  Supine AROM ER and ER x 5 with tactile cueing for form Standing IR AAROM stretch behind back x 10 with 5sec hold PT education for home exercise pacing and progression to limit sx aggravation with appropriate strengthening, pt verbalized understanding   PATIENT EDUCATION: 02/27/2022 Education details: HEP progression Person educated: Patient Education method: Consulting civil engineer, Media planner, Verbal cues, and Handouts Education comprehension: verbalized understanding, returned demonstration, and verbal cues required     HOME EXERCISE PROGRAM: Access Code: [email or web address] URL: [email or web address] Date: 02/27/2022 Prepared by: Faustino Congress   Exercises - Seated Scapular Retraction  - 3-5 x daily - 7 x weekly - 1 sets - 10 reps - 3-5 hold - Supine Shoulder Flexion Extension AAROM with Dowel  - 2-3 x daily - 7 x weekly - 1-2 sets - 10-15 reps - 3 hold - Supine Shoulder External Rotation with Dowel (Mirrored)  - 2-3 x daily - 7 x weekly - 1 sets - 10 reps - 5 hold - Supine Shoulder Flexion Extension Full Range AROM  - 1-2 x daily - 7 x weekly - 2-3 sets - 10-15 reps - Sidelying Shoulder Abduction Palm Forward  - 1-2 x daily - 7 x weekly - 2-3 sets - 10-15 reps - Shoulder External Rotation and Scapular Retraction with Resistance  - 3-5 x daily - 7 x weekly - 1-2 sets - 10 reps - Standing Shoulder  Flexion AAROM with Dowel  - 1-2 x daily - 7 x weekly - 2 sets - 10 reps - Sidelying Shoulder Flexion 15 Degrees (Mirrored)  - 1-2 x daily - 7 x weekly - 3 sets - 10 reps   ASSESSMENT:   CLINICAL IMPRESSION: Medical necessity for continued skilled PT services at this time to address strength for ability to perform lifting of arm against gravity s shrug to facilitate functional use of arm and lightweight lifting/carrying. Reassessment of ROM measurements show slight improvements. Pt tolerated all exercises well today, but continues to require constant cues due to compensation with R UE. She demonstrates increased fatigue with increased reps. Pt denies being active with her HEP. Encouraged increased effort with pt verbal understanding. Pt will continue to benefit from skilled PT to address continued deficits.     OBJECTIVE IMPAIRMENTS decreased activity tolerance, decreased coordination, decreased endurance, decreased mobility, decreased ROM, decreased strength, hypomobility, increased edema, increased fascial restrictions, impaired perceived functional ability, impaired flexibility, impaired UE functional use, improper body mechanics, and pain.    ACTIVITY LIMITATIONS carrying, lifting, bed mobility, bathing, toileting, dressing, reach over head, hygiene/grooming, and locomotion level   PARTICIPATION LIMITATIONS: meal prep, cleaning, laundry, interpersonal relationship, community activity, occupation, and yard work   PERSONAL FACTORS  Chronic back pain, CKD, COPD, CAD, depression, DM, GERD  are also affecting patient's  functional outcome.    REHAB POTENTIAL: Good   CLINICAL DECISION MAKING: Stable/uncomplicated   EVALUATION COMPLEXITY: Low     GOALS: Goals reviewed with patient? Yes   Short term PT Goals (target date for Short term goals are 3 weeks 01/16/2022) Patient will demonstrate independent use of home exercise program to maintain progress from in clinic treatments. Goal status: MET  01/16/22   Long term PT goals (target dates for all long term goals are 4 weeks  04/05/2022 )   1. Patient will demonstrate/report pain at worst less than or equal to 2/10 to facilitate minimal limitation in daily activity secondary to pain symptoms. Goal status: revised - 03/08/2022   2. Patient will demonstrate independent use of home exercise program to facilitate ability to maintain/progress functional gains from skilled physical therapy services. Goal status: revised - 03/08/2022   3. Patient will demonstrate FOTO outcome > or = 52 % to indicate reduced disability due to condition. Goal status: MET 03/20/2022   4.  Patient will demonstrate Rt Braggs joint mobility WFL to facilitate usual self care, dressing, reaching overhead at PLOF s limitation due to symptoms.  Goal status: revised - 03/08/2022   5.  Patient will demonstrate Rt UE MMT 4/5 or greater throughout to facilitate usual lifting, carrying in functional activity to PLOF s limitation.   Goal status: revised - 03/08/2022   6.  Patient will demonstrate ability to sleep s restriction due to symptom.  Goal status: revised - 03/08/2022     PLAN: PT FREQUENCY:  1x/week   PT DURATION: 4 weeks   PLANNED INTERVENTIONS: Therapeutic exercises, Therapeutic activity, Neuro Muscular re-education, Balance training, Gait training, Patient/Family education, Joint mobilization, Stair training, DME instructions, Dry Needling, Electrical stimulation, Cryotherapy, Moist heat, Taping, Ultrasound, Ionotophoresis 19m/ml Dexamethasone, and Manual therapy.  All included unless contraindicated   PLAN FOR NEXT SESSION: Progressive strengthening against gravity as tolerated.   SRudi HeapPT, DPT 04/17/22  3:17 PM   PHYSICAL THERAPY DISCHARGE SUMMARY  Visits from Start of Care: 23  Current functional level related to goals / functional outcomes: See note   Remaining deficits: See note   Education / Equipment: HEP  Patient goals were partially  met. Patient is being discharged due to not returning since the last visit.  MScot Jun PT, DPT, OCS, ATC 05/21/22  11:41 AM

## 2022-04-16 ENCOUNTER — Other Ambulatory Visit: Payer: Self-pay | Admitting: Internal Medicine

## 2022-04-16 ENCOUNTER — Other Ambulatory Visit: Payer: Self-pay

## 2022-04-16 MED ORDER — TRELEGY ELLIPTA 100-62.5-25 MCG/ACT IN AEPB
1.0000 | INHALATION_SPRAY | Freq: Every day | RESPIRATORY_TRACT | 0 refills | Status: DC
  Filled 2022-04-16: qty 60, 30d supply, fill #0

## 2022-04-16 MED ORDER — MONTELUKAST SODIUM 10 MG PO TABS
10.0000 mg | ORAL_TABLET | Freq: Every day | ORAL | 0 refills | Status: DC
  Filled 2022-04-16: qty 30, 30d supply, fill #0

## 2022-04-16 NOTE — Telephone Encounter (Signed)
Requested medication (s) are due for refill today:   Yes  Requested medication (s) are on the active medication list:   Yes for both  Future visit scheduled:   No   Last ordered: Singulair 12/12/2021 #30, 2 refills;  Trelegy 12/12/2021 60 each, 2 refills  Returned because no protocol assigned to Trelegy.   Requested Prescriptions  Pending Prescriptions Disp Refills   montelukast (SINGULAIR) 10 MG tablet 30 tablet 2    Sig: TAKE 1 TABLET BY MOUTH AT BEDTIME.     Pulmonology:  Leukotriene Inhibitors Passed - 04/16/2022 11:34 AM      Passed - Valid encounter within last 12 months    Recent Outpatient Visits           5 months ago Pulmonary emphysema, unspecified emphysema type [facility])   [place] Elsie Stain, MD   5 months ago Preoperative evaluation to rule out surgical contraindication   [place] Ladell Pier, MD   7 months ago Preoperative evaluation to rule out surgical contraindication   [place] Ladell Pier, MD   9 months ago COPD with chronic bronchitis [facility])   [place] Ladell Pier, MD   1 year ago Chronic low back pain, unspecified back pain laterality, unspecified whether sciatica present   [place], Michelle P, NP               Fluticasone-Umeclidin-Vilant (TRELEGY ELLIPTA) 100-62.5-25 MCG/ACT AEPB 60 each 2    Sig: Inhale 1 puff into the lungs daily     Off-Protocol Failed - 04/16/2022 11:34 AM      Failed - Medication not assigned to a protocol, review manually.      Passed - Valid encounter within last 12 months    Recent Outpatient Visits           5 months ago Pulmonary emphysema, unspecified emphysema type [facility])   [place] Elsie Stain, MD   5 months ago Preoperative evaluation to rule out surgical contraindication   [place] Ladell Pier, MD   7 months ago Preoperative evaluation to rule out surgical contraindication   [place] Ladell Pier, MD   9 months ago COPD with chronic bronchitis [facility])   [place] Ladell Pier, MD   1 year ago Chronic low back pain, unspecified back pain laterality, unspecified whether sciatica present   [place], Michelle P, NP

## 2022-04-17 ENCOUNTER — Encounter: Payer: Self-pay | Admitting: Physical Therapy

## 2022-04-17 ENCOUNTER — Ambulatory Visit (INDEPENDENT_AMBULATORY_CARE_PROVIDER_SITE_OTHER): Payer: Medicare Other | Admitting: Physical Therapy

## 2022-04-17 ENCOUNTER — Other Ambulatory Visit: Payer: Self-pay

## 2022-04-17 DIAGNOSIS — M25611 Stiffness of right shoulder, not elsewhere classified: Secondary | ICD-10-CM | POA: Diagnosis not present

## 2022-04-17 DIAGNOSIS — M25511 Pain in right shoulder: Secondary | ICD-10-CM

## 2022-04-17 DIAGNOSIS — G8929 Other chronic pain: Secondary | ICD-10-CM

## 2022-04-17 DIAGNOSIS — M6281 Muscle weakness (generalized): Secondary | ICD-10-CM | POA: Diagnosis not present

## 2022-04-17 DIAGNOSIS — R600 Localized edema: Secondary | ICD-10-CM | POA: Diagnosis not present

## 2022-04-23 ENCOUNTER — Other Ambulatory Visit: Payer: Self-pay | Admitting: Internal Medicine

## 2022-04-23 ENCOUNTER — Other Ambulatory Visit: Payer: Self-pay

## 2022-04-23 ENCOUNTER — Ambulatory Visit: Payer: Medicare Other | Admitting: Gastroenterology

## 2022-04-23 ENCOUNTER — Encounter: Payer: Self-pay | Admitting: Hematology

## 2022-04-23 MED ORDER — METHOCARBAMOL 500 MG PO TABS
500.0000 mg | ORAL_TABLET | Freq: Two times a day (BID) | ORAL | 0 refills | Status: DC | PRN
  Filled 2022-04-23: qty 60, 30d supply, fill #0

## 2022-04-24 ENCOUNTER — Encounter: Payer: Medicare Other | Admitting: Rehabilitative and Restorative Service Providers"

## 2022-04-25 ENCOUNTER — Ambulatory Visit: Payer: Self-pay | Admitting: *Deleted

## 2022-04-25 NOTE — Telephone Encounter (Signed)
  Chief Complaint: SOB, cough Symptoms: SOB, cough, congestion Frequency: 2 days Pertinent Negatives: Patient denies chest pain Disposition: '[x]'$ ED /'[]'$ Urgent Care (no appt availability in office) / '[]'$ Appointment(In office/virtual)/ '[]'$  [place] [facility]/ '[]'$ Home Care/ '[]'$ Refused Recommended Disposition /'[]'$ [place] [facility]/ '[]'$  Follow-up with PCP Additional Notes: Patient states she does not have transportation- she rides the bus- advised ED/UC as soon as she can- if she gets worse call 911. Patient states she will try to go to [facility] tomorrow morning.

## 2022-04-25 NOTE — Telephone Encounter (Signed)
Reason for Disposition  [1] MODERATE difficulty breathing (e.g., speaks in phrases, SOB even at rest, pulse 100-120) AND [2] NEW-onset or WORSE than normal  Answer Assessment - Initial Assessment Questions 1. RESPIRATORY STATUS: "Describe your breathing?" (e.g., wheezing, shortness of breath, unable to speak, severe coughing)      SOB, cough, sneezing 2. ONSET: "When did this breathing problem begin?"      2 days ago 3. PATTERN "Does the difficult breathing come and go, or has it been constant since it started?"      constant 4. SEVERITY: "How bad is your breathing?" (e.g., mild, moderate, severe)    - MILD: No SOB at rest, mild SOB with walking, speaks normally in sentences, can lie down, no retractions, pulse < 100.    - MODERATE: SOB at rest, SOB with minimal exertion and prefers to sit, cannot lie down flat, speaks in phrases, mild retractions, audible wheezing, pulse 100-120.    - SEVERE: Very SOB at rest, speaks in single words, struggling to breathe, sitting hunched forward, retractions, pulse > 120      moderate 5. RECURRENT SYMPTOM: "Have you had difficulty breathing before?" If Yes, ask: "When was the last time?" and "What happened that time?"      Asthma, COPD 6. CARDIAC HISTORY: "Do you have any history of heart disease?" (e.g., heart attack, angina, bypass surgery, angioplasty)      Hx heart attack 7. LUNG HISTORY: "Do you have any history of lung disease?"  (e.g., pulmonary embolus, asthma, emphysema)     COPD, asthma 8. CAUSE: "What do you think is causing the breathing problem?"      Congestion in lungs, sinus 9. OTHER SYMPTOMS: "Do you have any other symptoms? (e.g., dizziness, runny nose, cough, chest pain, fever)     Cough, congestion, ? fever 10. O2 SATURATION MONITOR:  "Do you use an oxygen saturation monitor (pulse oximeter) at home?" If Yes, ask: "What is your reading (oxygen level) today?" "What is your usual oxygen saturation reading?" (e.g., 95%)         11.  PREGNANCY: "Is there any chance you are pregnant?" "When was your last menstrual period?"         12. TRAVEL: "Have you traveled out of the country in the last month?" (e.g., travel history, exposures)       Patient has been on bus, cleaned house with dog  Protocols used: Breathing Difficulty-A-AH

## 2022-04-26 ENCOUNTER — Encounter (HOSPITAL_COMMUNITY): Payer: Self-pay | Admitting: *Deleted

## 2022-04-26 ENCOUNTER — Ambulatory Visit (HOSPITAL_COMMUNITY)
Admission: EM | Admit: 2022-04-26 | Discharge: 2022-04-26 | Disposition: A | Discharge status: Home / Self Care | Payer: Medicare Other | Attending: Emergency Medicine | Admitting: Emergency Medicine

## 2022-04-26 ENCOUNTER — Other Ambulatory Visit: Payer: Self-pay

## 2022-04-26 DIAGNOSIS — Z20822 Contact with and (suspected) exposure to covid-19: Secondary | ICD-10-CM

## 2022-04-26 DIAGNOSIS — J441 Chronic obstructive pulmonary disease with (acute) exacerbation: Secondary | ICD-10-CM

## 2022-04-26 DIAGNOSIS — J0140 Acute pansinusitis, unspecified: Secondary | ICD-10-CM | POA: Diagnosis not present

## 2022-04-26 LAB — RESP PANEL BY RT-PCR (FLU A&B, COVID) ARPGX2
Influenza A by PCR: NEGATIVE
Influenza B by PCR: NEGATIVE
SARS Coronavirus 2 by RT PCR: NEGATIVE

## 2022-04-26 MED ORDER — FLUTICASONE PROPIONATE 50 MCG/ACT NA SUSP
2.0000 | Freq: Every day | NASAL | 0 refills | Status: DC
  Filled 2022-04-26: qty 16, fill #0

## 2022-04-26 MED ORDER — AMOXICILLIN-POT CLAVULANATE 875-125 MG PO TABS
1.0000 | ORAL_TABLET | Freq: Two times a day (BID) | ORAL | 0 refills | Status: DC
  Filled 2022-04-26: qty 14, 7d supply, fill #0

## 2022-04-26 MED ORDER — FLUTICASONE PROPIONATE 50 MCG/ACT NA SUSP: 2.0000 | Freq: Every day | NASAL | 0 refills | Status: DC

## 2022-04-26 MED ORDER — PREDNISONE 20 MG PO TABS: 40.0000 mg | ORAL_TABLET | Freq: Every day | ORAL | 0 refills | Status: DC

## 2022-04-26 MED ORDER — AMOXICILLIN-POT CLAVULANATE 875-125 MG PO TABS: 1.0000 | ORAL_TABLET | Freq: Two times a day (BID) | ORAL | 0 refills | Status: DC

## 2022-04-26 MED ORDER — PREDNISONE 20 MG PO TABS
40.0000 mg | ORAL_TABLET | Freq: Every day | ORAL | 0 refills | Status: DC
  Filled 2022-04-26: qty 10, 5d supply, fill #0

## 2022-04-26 MED ORDER — ALBUTEROL SULFATE HFA 108 (90 BASE) MCG/ACT IN AERS
1.0000 | INHALATION_SPRAY | RESPIRATORY_TRACT | 0 refills | Status: DC | PRN
  Filled 2022-04-26: qty 1, fill #0

## 2022-04-26 MED ORDER — AEROCHAMBER MV MISC
1 refills | Status: DC
  Filled 2022-04-26: qty 1, fill #0

## 2022-04-26 MED ORDER — AEROCHAMBER MV MISC: 1 refills | Status: DC

## 2022-04-26 MED ORDER — ALBUTEROL SULFATE HFA 108 (90 BASE) MCG/ACT IN AERS: 1.0000 | INHALATION_SPRAY | RESPIRATORY_TRACT | 0 refills | Status: DC | PRN

## 2022-04-26 NOTE — ED Triage Notes (Signed)
Pt states has had the cough x couple of days but she has COPD. She states she has a headache she has been taking cough and cold HBP, cough supp and antihistamine. She staes she feels SOB and gets dizzy when she stands.

## 2022-04-26 NOTE — Telephone Encounter (Signed)
Pt has been seen in urgent care

## 2022-04-26 NOTE — ED Provider Notes (Addendum)
HPI  SUBJECTIVE:  [name] [name] is a 66 y.o. female who presents with throbbing sinus headache, sinus pain and pressure, postnasal drip, clear rhinorrhea, sore throat secondary to postnasal drip for the past 2 days.  She reports a cough that is occasionally productive of thick, clear sputum, shortness of breath, wheezing, chest tightness consistent with COPD exacerbations and dyspnea on exertion.  She reports feeling dizzy with large positional changes.  States that symptoms started with diarrhea and abdominal pain, but this has resolved.  No fevers, body aches, loss of sense of smell and taste, nausea, vomiting.  No known COVID or flu exposure.  She did not get the COVID-vaccine.  She got this years flu vaccine.  No antibiotics in the past 3 months.  No recent steroids.  No antipyretic in the past 6 hours.  She states this feels identical to previous COPD exacerbations and sinus infections.  She has been taking over-the-counter cough/cold high blood pressure medication, and has been using her albuterol nebulizer more frequently.  States that she needed it twice last night, normally uses it as needed.  Symptoms also feel better with coughing.  Symptoms are worse with exertion. She has a complex medical history including coronary disease status post stenting, on Brilinta, COPD/asthma, hypertension, MI, depression, continues to smoke, hypothyroidism, neuropathy, GERD, dyslipidemia, diabetes.  [facility]  Past Medical History:  Diagnosis Date   Allergy    seasonal   Anemia    Anxiety    takes Lexapro daily   Arthritis    "back, from neck down pass my bra area" (03/25/2017)   Asthma    Bartholin gland cyst 08/29/2011   Bruises easily    pt is on Effient   Chronic back pain    herniated nucleus pulposus   Chronic back pain    "neck to bra area; lower back" (03/25/2017)   Chronic kidney disease    recurrent UTI's this year 2022   COPD (chronic obstructive pulmonary  disease) (HCC)    early stages   Coronary artery disease    Depression    takes Klonopin daily   Diabetes mellitus without complication ([place])    Diverticulosis    Fibroadenoma of left breast    GERD (gastroesophageal reflux disease)    takes Nexium daily   H/O hiatal hernia    Heart attack ([place]) 2011   Hemorrhoids    Hernia    Hyperlipidemia    takes Lipitor daily   Hypertension    takes Losartan daily and Labetalol bid   Hypothyroidism    takes Synthroid daily   Insomnia    hydroxyzine prn   Joint pain    Pneumonia    "couple times" (03/25/2017)   Pre-diabetes    "just found out 1 wk ago" (03/25/2017)   Psoriasis    elbows,knees,back   Shortness of breath    with exertion   Slowing of urinary stream    Stress incontinence     Past Surgical History:  Procedure Laterality Date   [place]   "left one of my ovaries"   BACK SURGERY     BIOPSY  05/27/2019   Procedure: BIOPSY;  Surgeon: Daneil Dolin, MD;  [facility];  Service: Endoscopy;;   COLONOSCOPY  2011   Dr. Ardis Hughs: Mild diverticulosis, descending diminutive colon polyp (not retrieved), next colonoscopy 10 years   CORONARY ANGIOPLASTY WITH STENT PLACEMENT  2011  X2   "regular stents didn't work; had to go back in in ~ 1 month and put in medicated stents"   [place]     ESOPHAGOGASTRODUODENOSCOPY     ESOPHAGOGASTRODUODENOSCOPY (EGD) WITH PROPOFOL N/A 05/27/2019   normal esophagus, dilation, erosive gastropathy s/p biopsy, normal duodenum. Negative H.pylori.    LEFT HEART CATH AND CORONARY ANGIOGRAPHY N/A 03/26/2017   Procedure: LEFT HEART CATH AND CORONARY ANGIOGRAPHY;  Surgeon: Belva Crome, MD;  Location: [place] [facility];  Service: Cardiovascular;  Laterality: N/A;   LUMBAR LAMINECTOMY/DECOMPRESSION MICRODISCECTOMY  08/05/2011   Procedure: LUMBAR LAMINECTOMY/DECOMPRESSION MICRODISCECTOMY;  Surgeon: Otilio Connors,  MD;  Location: [place] [facility];  Service: Neurosurgery;  Laterality: Right;  Right Lumbar four-five extraforaminal discectomy   MALONEY DILATION N/A 05/27/2019   Procedure: Venia Minks DILATION;  Surgeon: Daneil Dolin, MD;  [facility];  Service: Endoscopy;  Laterality: N/A;  54   SHOULDER ARTHROSCOPY WITH ROTATOR CUFF REPAIR AND OPEN BICEPS TENODESIS Right 12/24/2021   Procedure: RIGHT SHOULDER ARTHROSCOPY WITH ROTATOR CUFF REPAIR AND BICEPS TENODESIS;  Surgeon: Marybelle Killings, MD;  Location: [place];  Service: Orthopedics;  Laterality: Right;   TONSILLECTOMY     as a child   UPPER GASTROINTESTINAL ENDOSCOPY      Family History  Problem Relation Age of Onset   Heart disease Mother    Hypertension Mother    Stroke Mother    Mental illness Mother    Heart disease Father    Hypertension Father    Diabetes Father    Colon polyps Brother    Breast cancer Maternal Aunt    Throat cancer Maternal Uncle    Thyroid disease Paternal Aunt    Heart disease Maternal Grandfather    Cancer Paternal Grandmother        [place]   Anesthesia problems Daughter    Hypotension Neg Hx    Malignant hyperthermia Neg Hx    Pseudochol deficiency Neg Hx    Colon cancer Neg Hx    Stomach cancer Neg Hx    Esophageal cancer Neg Hx    Pancreatic cancer Neg Hx    Rectal cancer Neg Hx     Social History   Tobacco Use   Smoking status: Every Day    Packs/day: 0.50    Years: 50.00    Total pack years: 25.00    Types: Cigarettes   Smokeless tobacco: Never  Vaping Use   Vaping Use: Some days   Substances: Nicotine  Substance Use Topics   Alcohol use: Yes    Comment: once a week   Drug use: Yes    Types: Marijuana    Comment: history of cocaine use, been about a year, per patient (12/18/21)    No current facility-administered medications for this encounter.  Current Outpatient Medications:    amLODipine (NORVASC) 5 MG tablet, TAKE 1 TABLET (5 MG TOTAL) BY MOUTH DAILY., Disp: 90 tablet, Rfl: 0    aspirin EC 81 MG tablet, Take 81 mg by mouth daily., Disp: , Rfl:    atorvastatin (LIPITOR) 80 MG tablet, Take 1 tablet (80 mg total) by mouth daily. please schedule appointment for more refills, Disp: 90 tablet, Rfl: 3   Blood Glucose Monitoring Suppl (TRUE METRIX METER) DEVI, 1 kit by Does not apply route 4 (four) times daily., Disp: 1 Device, Rfl: 0   busPIRone (BUSPAR) 15 MG tablet, TAKE 2 TABLETS IN THE MORNING, 1 TAB AT NOON AND 1  TAB IN THE EVENING, Disp: 120 tablet, Rfl: 3   Chlorpheniramine-DM (COUGH & COLD HBP PO), Take 1 tablet by mouth daily as needed (congestion)., Disp: , Rfl:    clonazePAM (KLONOPIN) 0.5 MG tablet, Take 1 tablet (0.5 mg total) by mouth 2 (two) times daily as needed for anxiety., Disp: 60 tablet, Rfl: 1   doxepin (SINEQUAN) 75 MG capsule, Take 1 capsule (75 mg total) by mouth at bedtime., Disp: 30 capsule, Rfl: 3   escitalopram (LEXAPRO) 10 MG tablet, TAKE 3 TABLETS (30 MG TOTAL) BY MOUTH DAILY., Disp: 90 tablet, Rfl: 3   ferrous sulfate 325 (65 FE) MG tablet, Take 1 tablet (325 mg total) by mouth daily with breakfast., Disp: 100 tablet, Rfl: 1   Fluticasone-Umeclidin-Vilant (TRELEGY ELLIPTA) 100-62.5-25 MCG/ACT AEPB, Inhale 1 puff into the lungs daily., Disp: 60 each, Rfl: 0   furosemide (LASIX) 20 MG tablet, Take 1 tablet (20 mg total) by mouth daily., Disp: 30 tablet, Rfl: 2   gabapentin (NEURONTIN) 600 MG tablet, TAKE 1 TABLET BY MOUTH 4 TIMES DAILY, Disp: 360 tablet, Rfl: 1   glucose blood (TRUE METRIX BLOOD GLUCOSE TEST) test strip, Use as instructed, Disp: 100 each, Rfl: 12   ipratropium-albuterol (DUONEB) 0.5-2.5 (3) MG/3ML SOLN, TAKE 3 MLS VIA NEBULIZATION EVERY 6 HOURS, Disp: 360 mL, Rfl: 1   levothyroxine (SYNTHROID) 112 MCG tablet, Take 1 tablet (112 mcg total) by mouth daily., Disp: 30 tablet, Rfl: 6   loperamide (IMODIUM A-D) 2 MG tablet, Take 4 mg by mouth 4 (four) times daily as needed for diarrhea or loose stools., Disp: , Rfl:    losartan (COZAAR) 50  MG tablet, TAKE 1 & 1/2 TABLETS BY MOUTH DAILY., Disp: 45 tablet, Rfl: 2   metFORMIN (GLUCOPHAGE) 500 MG tablet, TAKE 1 TABLET (500 MG TOTAL) BY MOUTH 2 (TWO) TIMES DAILY WITH A MEAL., Disp: 60 tablet, Rfl: 3   methocarbamol (ROBAXIN) 500 MG tablet, Take 1 tablet (500 mg total) by mouth 2 (two) times daily as needed for muscle spasms., Disp: 60 tablet, Rfl: 0   mirtazapine (REMERON) 30 MG tablet, TAKE 1 TABLET (30 MG TOTAL) BY MOUTH AT BEDTIME., Disp: 30 tablet, Rfl: 3   montelukast (SINGULAIR) 10 MG tablet, Take 1 tablet (10 mg total) by mouth at bedtime., Disp: 30 tablet, Rfl: 0   nebivolol (BYSTOLIC) 10 MG tablet, TAKE 1 TABLET (10 MG TOTAL) BY MOUTH DAILY., Disp: 90 tablet, Rfl: 1   omeprazole (PRILOSEC) 40 MG capsule, Take 1 capsule (40 mg total) by mouth 2 (two) times daily before a meal., Disp: 60 capsule, Rfl: 2   sodium chloride (OCEAN) 0.65 % SOLN nasal spray, Place 1 spray into both nostrils as needed for congestion., Disp: , Rfl:    ticagrelor (BRILINTA) 60 MG TABS tablet, TAKE 1 TABLET (60 MG TOTAL) BY MOUTH 2 (TWO) TIMES DAILY., Disp: 60 tablet, Rfl: 5   traMADol (ULTRAM) 50 MG tablet, Take 1 tablet (50 mg total) by mouth every 6 (six) hours as needed., Disp: 40 tablet, Rfl: 0   TRUEPLUS LANCETS 28G MISC, 28 g by Does not apply route QID., Disp: 120 each, Rfl: 2   albuterol (VENTOLIN HFA) 108 (90 Base) MCG/ACT inhaler, Inhale 1-2 puffs into the lungs every 4 (four) hours as needed for wheezing or shortness of breath., Disp: 1 each, Rfl: 0   amoxicillin-clavulanate (AUGMENTIN) 875-125 MG tablet, Take 1 tablet by mouth every 12 (twelve) hours., Disp: 14 tablet, Rfl: 0   fluticasone (FLONASE) 50 MCG/ACT nasal  spray, Place 2 sprays into both nostrils daily., Disp: 16 g, Rfl: 0   nitroGLYCERIN (NITROSTAT) 0.4 MG SL tablet, Place 1 tablet (0.4 mg total) under the tongue every 5 (five) minutes as needed for chest pain., Disp: 25 tablet, Rfl: 3   predniSONE (DELTASONE) 20 MG tablet, Take 2  tablets (40 mg total) by mouth daily with breakfast for 5 days., Disp: 10 tablet, Rfl: 0   Spacer/Aero-Holding Chambers (AEROCHAMBER MV) inhaler, Use as instructed, Disp: 1 each, Rfl: 1  Allergies  Allergen Reactions   Avocado Anaphylaxis   Latex Shortness Of Breath and Rash   Codeine Nausea Only     ROS  As noted in HPI.   Physical Exam  BP 106/77 (BP Location: Right Arm)   Pulse (!) 53   Temp 98.9 F (37.2 C) (Oral)   Resp 20   SpO2 95%   Constitutional: Well developed, well nourished, no acute distress Eyes:  EOMI, conjunctiva normal bilaterally HENT: Normocephalic, atraumatic,mucus membranes moist.  Positive purulent nasal congestion.  Erythematous, swollen turbinates.  Positive maxillary, frontal sinus tenderness.  No postnasal drip. Neck: No cervical lymphadenopathy Respiratory: Normal inspiratory effort, poor air movements, diffuse expiratory wheezing throughout all lung fields Cardiovascular: Regular bradycardia, no murmurs, rubs, gallops GI: nondistended skin: No rash, skin intact Musculoskeletal: no deformities Neurologic: Alert & oriented x 3, no focal neuro deficits Psychiatric: Speech and behavior appropriate   ED Course   Medications - No data to display  Orders Placed This Encounter  Procedures   Resp Panel by RT-PCR (Flu A&B, Covid) Anterior Nasal Swab    Standing Status:   Standing    Number of Occurrences:   1    Order Specific Question:   Patient immune status    Answer:   Immunocompromised    Results for orders placed or performed during the hospital encounter of 04/26/22 (from the past 24 hour(s))  Resp Panel by RT-PCR (Flu A&B, Covid) Anterior Nasal Swab     Status: None   Collection Time: 04/26/22  8:42 AM   Specimen: Anterior Nasal Swab  Result Value Ref Range   SARS Coronavirus 2 by RT PCR NEGATIVE NEGATIVE   Influenza A by PCR NEGATIVE NEGATIVE   Influenza B by PCR NEGATIVE NEGATIVE   No results found.  ED Clinical  Impression  1. Acute non-recurrent pansinusitis   2. COPD exacerbation ([place])   3. Encounter for laboratory testing for COVID-19 virus      ED Assessment/Plan   Outside records reviewed.  Additional medical history obtained.  As noted in HPI.  Presentation consistent with a sinusitis/early COPD exacerbation.  Sending off COVID and flu.  She qualifies for antivirals if either 1 of these are positive.  In the meantime, we will treat for sinusitis/COPD exacerbation with Augmentin, prednisone, regularly scheduled albuterol inhaler with a spacer, Flonase, saline nasal irrigation.  Blood pressure is a little lower than usual, dizziness could be coming from this, but it also be due to the cold medication.  There has been no syncope.  Follow-up with PCP as needed.  ER return precautions given.  COVID, influenza negative.  Plan as above.  Discussed labs,  MDM, treatment plan, and plan for follow-up with patient. Discussed sn/sx that should prompt return to the ED. patient agrees with plan.   Meds ordered this encounter  Medications   DISCONTD: amoxicillin-clavulanate (AUGMENTIN) 875-125 MG tablet    Sig: Take 1 tablet by mouth every 12 (twelve) hours.    Dispense:  14 tablet    Refill:  0   DISCONTD: predniSONE (DELTASONE) 20 MG tablet    Sig: Take 2 tablets (40 mg total) by mouth daily with breakfast for 5 days.    Dispense:  10 tablet    Refill:  0   DISCONTD: fluticasone (FLONASE) 50 MCG/ACT nasal spray    Sig: Place 2 sprays into both nostrils daily.    Dispense:  16 g    Refill:  0   DISCONTD: Spacer/Aero-Holding Chambers (AEROCHAMBER MV) inhaler    Sig: Use as instructed    Dispense:  1 each    Refill:  1   DISCONTD: albuterol (VENTOLIN HFA) 108 (90 Base) MCG/ACT inhaler    Sig: Inhale 1-2 puffs into the lungs every 4 (four) hours as needed for wheezing or shortness of breath.    Dispense:  1 each    Refill:  0      *This clinic note was created using Theatre manager. Therefore, there may be occasional mistakes despite careful proofreading.  ?    Melynda Ripple, MD 04/26/22 1057    Melynda Ripple, MD 04/26/22 1335

## 2022-04-26 NOTE — Discharge Instructions (Addendum)
Continue the high blood pressure cough and cold medicine.  Finish the Augmentin and prednisone, unless a healthcare provider tells you to stop.  2 puffs from your albuterol inhaler using your spacer every 4 hours for 2 days, then every 6 hours for 2 days then as needed.  You can back off on the albuterol if you start to improve sooner.  Use the Flonase once a day and use a NeilMed sinus rinse with distilled water as often as you want to to reduce nasal congestion. Follow the directions on the box.   Go to www.goodrx.com to look up your medications. This will give you a list of where you can find your prescriptions at the most affordable prices. Or you can ask the pharmacist what the cash price is. This is frequently cheaper than going through insurance.

## 2022-04-29 ENCOUNTER — Other Ambulatory Visit: Payer: Self-pay

## 2022-05-01 ENCOUNTER — Encounter: Payer: Medicare Other | Admitting: Rehabilitative and Restorative Service Providers"

## 2022-05-03 ENCOUNTER — Other Ambulatory Visit: Payer: Self-pay | Admitting: Internal Medicine

## 2022-05-03 ENCOUNTER — Other Ambulatory Visit: Payer: Self-pay

## 2022-05-03 DIAGNOSIS — E039 Hypothyroidism, unspecified: Secondary | ICD-10-CM

## 2022-05-03 MED ORDER — LEVOTHYROXINE SODIUM 112 MCG PO TABS
112.0000 ug | ORAL_TABLET | Freq: Every day | ORAL | 0 refills | Status: DC
  Filled 2022-05-03: qty 30, 30d supply, fill #0

## 2022-05-07 ENCOUNTER — Other Ambulatory Visit: Payer: Self-pay

## 2022-05-08 ENCOUNTER — Encounter: Payer: Medicare Other | Admitting: Rehabilitative and Restorative Service Providers"

## 2022-05-08 NOTE — Progress Notes (Deleted)
05/08/2022 [name] [name] [id number] June 24, 1956   Chief Complaint:  History of Present Illness: [name] [name] is a 66 year old female with a past medical history of anxiety, depression, chronic back pain, arthritis, hypertension, hyperlipidemia, coronary artery disease s/p bare metal stent 2001 and s/p DES 2011 on Brilinta, asthma, COPD, chronic tobacco use, hypothyroidism, diabetes mellitus type 2, GERD, diverticulosis and colon polyps.  She is known by Dr. Ardis Hughs.     Latest Ref Rng & Units 02/18/2022    1:51 PM 11/28/2021    4:45 PM 11/16/2021    4:35 PM  CBC  WBC 4.0 - 10.5 K/uL 5.8  7.1  8.6   Hemoglobin 12.0 - 15.0 g/dL 15.5  12.5  10.9   Hematocrit 36.0 - 46.0 % 46.7  38.3  34.3   Platelets 150 - 400 K/uL 515  429.0  531.0        Latest Ref Rng & Units 02/18/2022    1:51 PM 12/18/2021    3:40 PM 10/03/2021   12:30 PM  CMP  Glucose 70 - 99 mg/dL 105  112  104   BUN 8 - 23 mg/dL '15  12  10   '$ Creatinine 0.44 - 1.00 mg/dL 0.86  0.98  0.78   Sodium 135 - 145 mmol/L 139  139  139   Potassium 3.5 - 5.1 mmol/L 4.2  3.9  4.0   Chloride 98 - 111 mmol/L 106  105  105   CO2 22 - 32 mmol/L '25  24  28   '$ Calcium 8.9 - 10.3 mg/dL 9.7  9.2  9.8   Total Protein 6.5 - 8.1 g/dL 7.5   7.2   Total Bilirubin 0.3 - 1.2 mg/dL 0.1   0.4   Alkaline Phos 38 - 126 U/L 91   97   AST 15 - 41 U/L 19   11   ALT 0 - 44 U/L 15   11     ECHO 03/25/2022: Left ventricular ejection fraction, by estimation, is 60 to 65%. The left ventricle has normal function. The left ventricle has no regional wall motion abnormalities. Left ventricular diastolic parameters are consistent with Grade I diastolic dysfunction (impaired relaxation). The average left ventricular global longitudinal strain is -22.8 %. The global longitudinal strain is normal. 1. Right ventricular systolic function is normal. The right ventricular size is normal. Tricuspid regurgitation signal is inadequate for assessing PA pressure. 2. 3.  The mitral valve is normal in structure. Trivial mitral valve regurgitation. 4. The aortic valve is tricuspid. Aortic valve regurgitation is not visualized. The inferior vena cava is normal in size with greater than 50% respiratory variability, suggesting right atrial pressure of 3 mmHg. 5. Comparison(s): No significant change from prior study. 03/26/17 EF 55-60%.  CTAP 09/14/2020: 1. No acute findings or explanation for the patient's symptoms. No evidence of intra-abdominal malignancy. 2. Diffuse atherosclerosis with significant involvement of the coronary arteries. Proximal luminal stenosis of the superior mesenteric artery could be symptomatic. 3. Stable probable small left adrenal adenoma, sigmoid diverticulosis and progressive degenerative disc disease, greatest at L4-5. 4. Aortic Atherosclerosis (ICD10-I70.0) and Emphysema    GI PROCEDURES   Capsule endoscopy 11/28/2021:  Negative study  EGD 11/19/2021: - Erythematous mucosa in the antrum. Biopsied. - Small hiatal hernia without Cameron's erosions. - The examination was otherwise normal. - Biopsies were taken with a cold forceps for evaluation of celiac disease.  EGD 02/19/2021: - Non-specific pan-gastritris. Biopsied to check for H. pylori. -  The examination was otherwise normal.  Colonoscopy 02/19/2021: - Three 3 to 7 mm polyps in the sigmoid colon and in the transverse colon, removed with a cold snare. Complete resection and retrieval. - Diverticulosis in the left colon. - Normal terminal ileum. - The examination was otherwise normal on direct and retroflexion views.  Past Medical History:  Diagnosis Date   Allergy    seasonal   Anemia    Anxiety    takes Lexapro daily   Arthritis    "back, from neck down pass my bra area" (03/25/2017)   Asthma    B[place]olin gland cyst 08/29/2011   Bruises easily    pt is on Effient   Chronic back pain    herniated nucleus pulposus   Chronic back pain    "neck to bra area;  lower back" (03/25/2017)   Chronic kidney disease    recurrent UTI's this year 2022   COPD (chronic obstructive pulmonary disease) (HCC)    early stages   Coronary artery disease    Depression    takes Klonopin daily   Diabetes mellitus without complication ([place])    Diverticulosis    Fibroadenoma of left breast    GERD (gastroesophageal reflux disease)    takes Nexium daily   H/O hiatal hernia    Heart attack ([place]) 2011   Hemorrhoids    Hernia    Hyperlipidemia    takes Lipitor daily   Hypertension    takes Losartan daily and Labetalol bid   Hypothyroidism    takes Synthroid daily   Insomnia    hydroxyzine prn   Joint pain    Pneumonia    "couple times" (03/25/2017)   Pre-diabetes    "just found out 1 wk ago" (03/25/2017)   Psoriasis    elbows,knees,back   Shortness of breath    with exertion   Slowing of urinary stream    Stress incontinence    Past Surgical History:  Procedure Laterality Date   [place]   "left one of my ovaries"   BACK SURGERY     BIOPSY  05/27/2019   Procedure: BIOPSY;  Surgeon: Daneil Dolin, MD;  [facility];  Service: Endoscopy;;   COLONOSCOPY  2011   Dr. Ardis Hughs: Mild diverticulosis, descending diminutive colon polyp (not retrieved), next colonoscopy 10 years   CORONARY ANGIOPLASTY WITH STENT PLACEMENT  2011 X2   "regular stents didn't work; had to go back in in ~ 1 month and put in medicated stents"   DILATION AND CURETTAGE OF UTERUS     ESOPHAGOGASTRODUODENOSCOPY     ESOPHAGOGASTRODUODENOSCOPY (EGD) WITH PROPOFOL N/A 05/27/2019   normal esophagus, dilation, erosive gastropathy s/p biopsy, normal duodenum. Negative H.pylori.    LEFT HEART CATH AND CORONARY ANGIOGRAPHY N/A 03/26/2017   Procedure: LEFT HEART CATH AND CORONARY ANGIOGRAPHY;  Surgeon: Belva Crome, MD;  Location: [place] [facility];  Service: Cardiovascular;  Laterality: N/A;   LUMBAR  LAMINECTOMY/DECOMPRESSION MICRODISCECTOMY  08/05/2011   Procedure: LUMBAR LAMINECTOMY/DECOMPRESSION MICRODISCECTOMY;  Surgeon: Otilio Connors, MD;  Location: [place] [facility];  Service: Neurosurgery;  Laterality: Right;  Right Lumbar four-five extraforaminal discectomy   MALONEY DILATION N/A 05/27/2019   Procedure: Venia Minks DILATION;  Surgeon: Daneil Dolin, MD;  [facility];  Service: Endoscopy;  Laterality: N/A;  54   SHOULDER ARTHROSCOPY WITH ROTATOR CUFF REPAIR AND OPEN BICEPS TENODESIS Right 12/24/2021   Procedure: RIGHT SHOULDER ARTHROSCOPY WITH  ROTATOR CUFF REPAIR AND BICEPS TENODESIS;  Surgeon: Marybelle Killings, MD;  Location: [facility];  Service: Orthopedics;  Laterality: Right;   TONSILLECTOMY     as a child   UPPER GASTROINTESTINAL ENDOSCOPY         Current Medications, Allergies, Past Medical History, Past Surgical History, Family History and Social History were reviewed in [employer] record.   Review of Systems:   Constitutional: Negative for fever, sweats, chills or weight loss.  Respiratory: Negative for shortness of breath.   Cardiovascular: Negative for chest pain, palpitations and leg swelling.  Gastrointestinal: See HPI.  Musculoskeletal: Negative for back pain or muscle aches.  Neurological: Negative for dizziness, headaches or paresthesias.    Physical Exam: There were no vitals taken for this visit. General: Well developed, w   ***female in no acute distress. Head: Normocephalic and atraumatic. Eyes: No scleral icterus. Conjunctiva pink . Ears: Normal auditory acuity. Mouth: Dentition intact. No ulcers or lesions.  Lungs: Clear throughout to auscultation. Heart: Regular rate and rhythm, no murmur. Abdomen: Soft, nontender and nondistended. No masses or hepatomegaly. Normal bowel sounds x 4 quadrants.  Rectal: *** Musculoskeletal: Symmetrical with no gross deformities. Extremities: No edema. Neurological: Alert oriented x 4. No focal  deficits.  Psychological: Alert and cooperative. Normal mood and affect  Assessment and Recommendations: ***

## 2022-05-09 ENCOUNTER — Ambulatory Visit: Payer: Medicare Other | Admitting: Nurse Practitioner

## 2022-05-16 ENCOUNTER — Ambulatory Visit: Payer: Self-pay | Admitting: *Deleted

## 2022-05-16 ENCOUNTER — Telehealth: Payer: Medicare Other | Admitting: Family Medicine

## 2022-05-16 ENCOUNTER — Telehealth: Payer: Medicare Other | Admitting: Physician Assistant

## 2022-05-16 DIAGNOSIS — J441 Chronic obstructive pulmonary disease with (acute) exacerbation: Secondary | ICD-10-CM

## 2022-05-16 DIAGNOSIS — B9689 Other specified bacterial agents as the cause of diseases classified elsewhere: Secondary | ICD-10-CM

## 2022-05-16 MED ORDER — PREDNISONE 10 MG PO TABS: ORAL_TABLET | ORAL | 0 refills | Status: DC

## 2022-05-16 MED ORDER — BENZONATATE 100 MG PO CAPS: 100.0000 mg | ORAL_CAPSULE | Freq: Three times a day (TID) | ORAL | 0 refills | Status: DC | PRN

## 2022-05-16 MED ORDER — DOXYCYCLINE HYCLATE 100 MG PO TABS: 100.0000 mg | ORAL_TABLET | Freq: Two times a day (BID) | ORAL | 0 refills | Status: DC

## 2022-05-16 NOTE — Patient Instructions (Signed)
[name] [name], thank you for joining [name] Rio, PA-C for today's virtual visit.  While this provider is not your primary care provider (PCP), if your PCP is located in our provider database this encounter information will be shared with them immediately following your visit.   [facility] [place] account gives you access to today's visit and all your visits, tests, and labs performed at [facility] " click here if you don't have a [facility] [place] account or go to [email or web address]  Consent: (Patient) [name] [name] provided verbal consent for this virtual visit at the beginning of the encounter.  Current Medications:  Current Outpatient Medications:    benzonatate (TESSALON) 100 MG capsule, Take 1 capsule (100 mg total) by mouth 3 (three) times daily as needed for cough., Disp: 30 capsule, Rfl: 0   doxycycline (VIBRA-TABS) 100 MG tablet, Take 1 tablet (100 mg total) by mouth 2 (two) times daily., Disp: 14 tablet, Rfl: 0   predniSONE (DELTASONE) 10 MG tablet, Take 4 tablets (40 mg total) by mouth daily with breakfast for 2 days, THEN 3 tablets (30 mg total) daily with breakfast for 2 days, THEN 2 tablets (20 mg total) daily with breakfast for 2 days, THEN 1 tablet (10 mg total) daily with breakfast for 2 days., Disp: 20 tablet, Rfl: 0   albuterol (VENTOLIN HFA) 108 (90 Base) MCG/ACT inhaler, Inhale 1-2 puffs into the lungs every 4 (four) hours as needed for wheezing or shortness of breath., Disp: 1 each, Rfl: 0   amLODipine (NORVASC) 5 MG tablet, TAKE 1 TABLET (5 MG TOTAL) BY MOUTH DAILY., Disp: 90 tablet, Rfl: 0   aspirin EC 81 MG tablet, Take 81 mg by mouth daily., Disp: , Rfl:    atorvastatin (LIPITOR) 80 MG tablet, Take 1 tablet (80 mg total) by mouth daily. please schedule appointment for more refills, Disp: 90 tablet, Rfl: 3   Blood Glucose Monitoring Suppl (TRUE METRIX METER) DEVI, 1 kit by Does not apply route 4 (four) times daily., Disp: 1 Device, Rfl:  0   busPIRone (BUSPAR) 15 MG tablet, TAKE 2 TABLETS IN THE MORNING, 1 TAB AT NOON AND 1 TAB IN THE EVENING, Disp: 120 tablet, Rfl: 3   Chlorpheniramine-DM (COUGH & COLD HBP PO), Take 1 tablet by mouth daily as needed (congestion)., Disp: , Rfl:    clonazePAM (KLONOPIN) 0.5 MG tablet, Take 1 tablet (0.5 mg total) by mouth 2 (two) times daily as needed for anxiety., Disp: 60 tablet, Rfl: 1   doxepin (SINEQUAN) 75 MG capsule, Take 1 capsule (75 mg total) by mouth at bedtime., Disp: 30 capsule, Rfl: 3   escitalopram (LEXAPRO) 10 MG tablet, TAKE 3 TABLETS (30 MG TOTAL) BY MOUTH DAILY., Disp: 90 tablet, Rfl: 3   ferrous sulfate 325 (65 FE) MG tablet, Take 1 tablet (325 mg total) by mouth daily with breakfast., Disp: 100 tablet, Rfl: 1   fluticasone (FLONASE) 50 MCG/ACT nasal spray, Place 2 sprays into both nostrils daily., Disp: 16 g, Rfl: 0   Fluticasone-Umeclidin-Vilant (TRELEGY ELLIPTA) 100-62.5-25 MCG/ACT AEPB, Inhale 1 puff into the lungs daily., Disp: 60 each, Rfl: 0   furosemide (LASIX) 20 MG tablet, Take 1 tablet (20 mg total) by mouth daily., Disp: 30 tablet, Rfl: 2   gabapentin (NEURONTIN) 600 MG tablet, TAKE 1 TABLET BY MOUTH 4 TIMES DAILY, Disp: 360 tablet, Rfl: 1   glucose blood (TRUE METRIX BLOOD GLUCOSE TEST) test strip, Use as instructed, Disp: 100 each, Rfl: 12  ipratropium-albuterol (DUONEB) 0.5-2.5 (3) MG/3ML SOLN, TAKE 3 MLS VIA NEBULIZATION EVERY 6 HOURS, Disp: 360 mL, Rfl: 1   levothyroxine (SYNTHROID) 112 MCG tablet, Take 1 tablet (112 mcg total) by mouth daily., Disp: 30 tablet, Rfl: 0   loperamide (IMODIUM A-D) 2 MG tablet, Take 4 mg by mouth 4 (four) times daily as needed for diarrhea or loose stools., Disp: , Rfl:    losartan (COZAAR) 50 MG tablet, TAKE 1 & 1/2 TABLETS BY MOUTH DAILY., Disp: 45 tablet, Rfl: 2   metFORMIN (GLUCOPHAGE) 500 MG tablet, TAKE 1 TABLET (500 MG TOTAL) BY MOUTH 2 (TWO) TIMES DAILY WITH A MEAL., Disp: 60 tablet, Rfl: 3   methocarbamol (ROBAXIN) 500 MG  tablet, Take 1 tablet (500 mg total) by mouth 2 (two) times daily as needed for muscle spasms., Disp: 60 tablet, Rfl: 0   mirtazapine (REMERON) 30 MG tablet, TAKE 1 TABLET (30 MG TOTAL) BY MOUTH AT BEDTIME., Disp: 30 tablet, Rfl: 3   montelukast (SINGULAIR) 10 MG tablet, Take 1 tablet (10 mg total) by mouth at bedtime., Disp: 30 tablet, Rfl: 0   nebivolol (BYSTOLIC) 10 MG tablet, TAKE 1 TABLET (10 MG TOTAL) BY MOUTH DAILY., Disp: 90 tablet, Rfl: 1   nitroGLYCERIN (NITROSTAT) 0.4 MG SL tablet, Place 1 tablet (0.4 mg total) under the tongue every 5 (five) minutes as needed for chest pain., Disp: 25 tablet, Rfl: 3   omeprazole (PRILOSEC) 40 MG capsule, Take 1 capsule (40 mg total) by mouth 2 (two) times daily before a meal., Disp: 60 capsule, Rfl: 2   sodium chloride (OCEAN) 0.65 % SOLN nasal spray, Place 1 spray into both nostrils as needed for congestion., Disp: , Rfl:    Spacer/Aero-Holding Chambers (AEROCHAMBER MV) inhaler, Use as instructed, Disp: 1 each, Rfl: 1   ticagrelor (BRILINTA) 60 MG TABS tablet, TAKE 1 TABLET (60 MG TOTAL) BY MOUTH 2 (TWO) TIMES DAILY., Disp: 60 tablet, Rfl: 5   traMADol (ULTRAM) 50 MG tablet, Take 1 tablet (50 mg total) by mouth every 6 (six) hours as needed., Disp: 40 tablet, Rfl: 0   TRUEPLUS LANCETS 28G MISC, 28 g by Does not apply route QID., Disp: 120 each, Rfl: 2   Medications ordered in this encounter:  Meds ordered this encounter  Medications   benzonatate (TESSALON) 100 MG capsule    Sig: Take 1 capsule (100 mg total) by mouth 3 (three) times daily as needed for cough.    Dispense:  30 capsule    Refill:  0    Order Specific Question:   Supervising Provider    Answer:   Chase Picket [id number]   doxycycline (VIBRA-TABS) 100 MG tablet    Sig: Take 1 tablet (100 mg total) by mouth 2 (two) times daily.    Dispense:  14 tablet    Refill:  0    Order Specific Question:   Supervising Provider    Answer:   Chase Picket [[id number]]   predniSONE  (DELTASONE) 10 MG tablet    Sig: Take 4 tablets (40 mg total) by mouth daily with breakfast for 2 days, THEN 3 tablets (30 mg total) daily with breakfast for 2 days, THEN 2 tablets (20 mg total) daily with breakfast for 2 days, THEN 1 tablet (10 mg total) daily with breakfast for 2 days.    Dispense:  20 tablet    Refill:  0    Order Specific Question:   Supervising Provider    Answer:   Lanny Cramp, PHILIP  Jenetta Downer [[id number]]     *If you need refills on other medications prior to your next appointment, please contact your pharmacy*  Follow-Up: Call back or seek an in-person evaluation if the symptoms worsen or if the condition fails to improve as anticipated.  [place] [phone]  Other Instructions Take antibiotic (Doxycycline) as directed.  Increase fluids.  Get plenty of rest. Use Mucinex for congestion. Take the prednisone and tessalon as directed. Keep up with your inhalers. Take a daily probiotic (I recommend Align or Culturelle, but even Activia Yogurt may be beneficial).  A humidifier placed in the bedroom may offer some relief for a dry, scratchy throat of nasal irritation.  Read information below on acute bronchitis. Please call or return to clinic if symptoms are not improving.  Acute Bronchitis Bronchitis is when the airways that extend from the windpipe into the lungs get red, puffy, and painful (inflamed). Bronchitis often causes thick spit (mucus) to develop. This leads to a cough. A cough is the most common symptom of bronchitis. In acute bronchitis, the condition usually begins suddenly and goes away over time (usually in 2 weeks). Smoking, allergies, and asthma can make bronchitis worse. Repeated episodes of bronchitis may cause more lung problems.  HOME CARE Rest. Drink enough fluids to keep your pee (urine) clear or pale yellow (unless you need to limit fluids as told by your doctor). Only take over-the-counter or prescription medicines as told by your  doctor. Avoid smoking and secondhand smoke. These can make bronchitis worse. If you are a smoker, think about using nicotine gum or skin patches. Quitting smoking will help your lungs heal faster. Reduce the chance of getting bronchitis again by: Washing your hands often. Avoiding people with cold symptoms. Trying not to touch your hands to your mouth, nose, or eyes. Follow up with your doctor as told.  GET HELP IF: Your symptoms do not improve after 1 week of treatment. Symptoms include: Cough. Fever. Coughing up thick spit. Body aches. Chest congestion. Chills. Shortness of breath. Sore throat.  GET HELP RIGHT AWAY IF:  You have an increased fever. You have chills. You have severe shortness of breath. You have bloody thick spit (sputum). You throw up (vomit) often. You lose too much body fluid (dehydration). You have a severe headache. You faint.  MAKE SURE YOU:  Understand these instructions. Will watch your condition. Will get help right away if you are not doing well or get worse. Document Released: 12/18/2007 Document Revised: 03/03/2013 Document Reviewed: 12/22/2012 [facility] Patient Information 2015 [place], [place]. This information is not intended to replace advice given to you by your health care provider. Make sure you discuss any questions you have with your health care provider.    If you have been instructed to have an in-person evaluation today at a local Urgent Care facility, please use the link below. It will take you to a list of all of our available [facility], including address, phone number and hours of operation. Please do not delay care.  [facility]  If you or a family member do not have a primary care provider, use the link below to schedule a visit and establish care. When you choose a [facility] primary care physician or advanced practice provider, you gain a long-term partner in health. Find a Primary Care  Provider  Learn more about [facility] in-office and virtual care options: [facility]

## 2022-05-16 NOTE — Progress Notes (Signed)
The patient no-showed for appointment despite this provider sending direct link, reaching out via phone with no response and waiting for at least 10 minutes from appointment time for patient to join. They will be marked as a NS for this appointment/time.   [name] [name] M [name] Millward, NP    

## 2022-05-16 NOTE — Telephone Encounter (Signed)
Patient calling to report she missed her UC VV today and reports she is not sure if she can do VV. Patient already triaged today and this NT recommended patient go to UC due to sx and medication request. No available appt with PCP. Patient reports she does not have transportation and would like to try UC VV again to schedule when her daughter comes home to assist with utiliizing My Chart account. Scheduled another UC VV at 5:30 pm for patient. Recommended patient go to ED if sx worsen.

## 2022-05-16 NOTE — Telephone Encounter (Signed)
Reason for Disposition  Wheezing is present  Answer Assessment - Initial Assessment Questions 1. ONSET: "When did the cough begin?"      2 weeks 2. SEVERITY: "How bad is the cough today?"      Moderate 3. SPUTUM: "Describe the color of your sputum" (none, dry cough; clear, white, yellow, green)     Clear 4. HEMOPTYSIS: "Are you coughing up any blood?" If so ask: "How much?" (flecks, streaks, tablespoons, etc.)     no 5. DIFFICULTY BREATHING: "Are you having difficulty breathing?" If Yes, ask: "How bad is it?" (e.g., mild, moderate, severe)    - MILD: No SOB at rest, mild SOB with walking, speaks normally in sentences, can lie down, no retractions, pulse < 100.    - MODERATE: SOB at rest, SOB with minimal exertion and prefers to sit, cannot lie down flat, speaks in phrases, mild retractions, audible wheezing, pulse 100-120.    - SEVERE: Very SOB at rest, speaks in single words, struggling to breathe, sitting hunched forward, retractions, pulse > 120      With exertion, nebs helps 6. FEVER: "Do you have a fever?" If Yes, ask: "What is your temperature, how was it measured, and when did it start?"     "Felt warm" 7. CARDIAC HISTORY: "Do you have any history of heart disease?" (e.g., heart attack, congestive heart failure)       8. LUNG HISTORY: "Do you have any history of lung disease?"  (e.g., pulmonary embolus, asthma, emphysema)     COPD 9. PE RISK FACTORS: "Do you have a history of blood clots?" (or: recent major surgery, recent prolonged travel, bedridden)      10. OTHER SYMPTOMS: "Do you have any other symptoms?" (e.g., runny nose, wheezing, chest pain)       Wheezing at times  Protocols used: Cough - Acute Productive-A-AH

## 2022-05-16 NOTE — Progress Notes (Signed)
Virtual Visit Consent   [name] [name], you are scheduled for a virtual visit with a [facility] provider today. Just as with appointments in the office, your consent must be obtained to participate. Your consent will be active for this visit and any virtual visit you may have with one of our providers in the next 365 days. If you have a MyChart account, a copy of this consent can be sent to you electronically.  As this is a virtual visit, video technology does not allow for your provider to perform a traditional examination. This may limit your provider's ability to fully assess your condition. If your provider identifies any concerns that need to be evaluated in person or the need to arrange testing (such as labs, EKG, etc.), we will make arrangements to do so. Although advances in technology are sophisticated, we cannot ensure that it will always work on either your end or our end. If the connection with a video visit is poor, the visit may have to be switched to a telephone visit. With either a video or telephone visit, we are not always able to ensure that we have a secure connection.  By engaging in this virtual visit, you consent to the provision of healthcare and authorize for your insurance to be billed (if applicable) for the services provided during this visit. Depending on your insurance coverage, you may receive a charge related to this service.  I need to obtain your verbal consent now. Are you willing to proceed with your visit today? [name] [name] has provided verbal consent on 05/16/2022 for a virtual visit (video or telephone). [name] [name], [place]  Date: 05/16/2022 5:28 PM  Virtual Visit via Video Note   I, [name] [name], connected with  [name] [name]  ([id number], [birth year]) on 05/16/22 at  5:30 PM EDT by a video-enabled telemedicine application and verified that I am speaking with the correct person using two identifiers.  Location: Patient: Virtual Visit Location  Patient: Home Provider: Virtual Visit Location Provider: Home Office   I discussed the limitations of evaluation and management by telemedicine and the availability of in person appointments. The patient expressed understanding and agreed to proceed.    History of Present Illness: [name] [name] is a 66 y.o. who identifies as a female who was assigned female at birth, and is being seen today for recurrence of chest congestion with cough that is sometimes productive of thick sputum, SOBOE with mild wheezing. Denies fever, chills. Denies chest pain. Some residual sinus pain after recent treatment for sinusitis with Augmentin. Is using her rescue inhaler as directed. Denies sick contact. Marland Kitchen   HPI: HPI  Problems:  Patient Active Problem List   Diagnosis Date Noted   Nontraumatic complete tear of right rotator cuff    Superior glenoid labrum lesion of left shoulder    Iron deficiency anemia 11/02/2021   Major depressive disorder, recurrent, in full remission with anxious distress ([place]) 05/31/2021   Rotator cuff tendinitis, right 05/08/2021   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   Chronic neck pain 02/26/2021   GAD (generalized anxiety disorder) 01/08/2021   Acute cystitis without hematuria 11/07/2020   Influenza vaccine needed 06/15/2020   Abdominal pain, epigastric 04/28/2019   Esophageal dysphagia 04/28/2019   Bipolar I disorder ([place]) 09/17/2018   Insomnia 10/28/2017   Prediabetes 05/28/2017   QT prolongation 03/25/2017   Encounter for screening mammogram for breast cancer 09/11/2015   Psoriasis 06/22/2015   COPD (chronic obstructive  pulmonary disease) (HCC)GOLD E 05/18/2015   Anxiety and depression 07/10/2013   Essential hypertension, benign 06/07/2013   Dyslipidemia 06/07/2013   Asthma, chronic 06/07/2013   Emphysema lung ([place]) 06/07/2013   Chronic back pain 06/07/2013   CAD S/P percutaneous coronary angioplasty 06/07/2013   GERD (gastroesophageal reflux disease)  06/07/2013   Breast lump on left side at 3 o'clock position 10/13/2012   S/P abdominal hysterectomy and right salpingo-oophorectomy 08/29/2011   TOBACCO ABUSE 07/17/2009   Chronic rhinitis 07/17/2009   Lung nodule < 6cm on CT 07/17/2009   ALLERGY, FOOD 07/17/2009   Hypothyroidism 12/22/2008    Allergies:  Allergies  Allergen Reactions   Avocado Anaphylaxis   Latex Shortness Of Breath and Rash   Codeine Nausea Only   Medications:  Current Outpatient Medications:    benzonatate (TESSALON) 100 MG capsule, Take 1 capsule (100 mg total) by mouth 3 (three) times daily as needed for cough., Disp: 30 capsule, Rfl: 0   doxycycline (VIBRA-TABS) 100 MG tablet, Take 1 tablet (100 mg total) by mouth 2 (two) times daily., Disp: 14 tablet, Rfl: 0   predniSONE (DELTASONE) 10 MG tablet, Take 4 tablets (40 mg total) by mouth daily with breakfast for 2 days, THEN 3 tablets (30 mg total) daily with breakfast for 2 days, THEN 2 tablets (20 mg total) daily with breakfast for 2 days, THEN 1 tablet (10 mg total) daily with breakfast for 2 days., Disp: 20 tablet, Rfl: 0   albuterol (VENTOLIN HFA) 108 (90 Base) MCG/ACT inhaler, Inhale 1-2 puffs into the lungs every 4 (four) hours as needed for wheezing or shortness of breath., Disp: 1 each, Rfl: 0   amLODipine (NORVASC) 5 MG tablet, TAKE 1 TABLET (5 MG TOTAL) BY MOUTH DAILY., Disp: 90 tablet, Rfl: 0   aspirin EC 81 MG tablet, Take 81 mg by mouth daily., Disp: , Rfl:    atorvastatin (LIPITOR) 80 MG tablet, Take 1 tablet (80 mg total) by mouth daily. please schedule appointment for more refills, Disp: 90 tablet, Rfl: 3   Blood Glucose Monitoring Suppl (TRUE METRIX METER) DEVI, 1 kit by Does not apply route 4 (four) times daily., Disp: 1 Device, Rfl: 0   busPIRone (BUSPAR) 15 MG tablet, TAKE 2 TABLETS IN THE MORNING, 1 TAB AT NOON AND 1 TAB IN THE EVENING, Disp: 120 tablet, Rfl: 3   Chlorpheniramine-DM (COUGH & COLD HBP PO), Take 1 tablet by mouth daily as needed  (congestion)., Disp: , Rfl:    clonazePAM (KLONOPIN) 0.5 MG tablet, Take 1 tablet (0.5 mg total) by mouth 2 (two) times daily as needed for anxiety., Disp: 60 tablet, Rfl: 1   doxepin (SINEQUAN) 75 MG capsule, Take 1 capsule (75 mg total) by mouth at bedtime., Disp: 30 capsule, Rfl: 3   escitalopram (LEXAPRO) 10 MG tablet, TAKE 3 TABLETS (30 MG TOTAL) BY MOUTH DAILY., Disp: 90 tablet, Rfl: 3   ferrous sulfate 325 (65 FE) MG tablet, Take 1 tablet (325 mg total) by mouth daily with breakfast., Disp: 100 tablet, Rfl: 1   fluticasone (FLONASE) 50 MCG/ACT nasal spray, Place 2 sprays into both nostrils daily., Disp: 16 g, Rfl: 0   Fluticasone-Umeclidin-Vilant (TRELEGY ELLIPTA) 100-62.5-25 MCG/ACT AEPB, Inhale 1 puff into the lungs daily., Disp: 60 each, Rfl: 0   furosemide (LASIX) 20 MG tablet, Take 1 tablet (20 mg total) by mouth daily., Disp: 30 tablet, Rfl: 2   gabapentin (NEURONTIN) 600 MG tablet, TAKE 1 TABLET BY MOUTH 4 TIMES DAILY, Disp: 360 tablet,  Rfl: 1   glucose blood (TRUE METRIX BLOOD GLUCOSE TEST) test strip, Use as instructed, Disp: 100 each, Rfl: 12   ipratropium-albuterol (DUONEB) 0.5-2.5 (3) MG/3ML SOLN, TAKE 3 MLS VIA NEBULIZATION EVERY 6 HOURS, Disp: 360 mL, Rfl: 1   levothyroxine (SYNTHROID) 112 MCG tablet, Take 1 tablet (112 mcg total) by mouth daily., Disp: 30 tablet, Rfl: 0   loperamide (IMODIUM A-D) 2 MG tablet, Take 4 mg by mouth 4 (four) times daily as needed for diarrhea or loose stools., Disp: , Rfl:    losartan (COZAAR) 50 MG tablet, TAKE 1 & 1/2 TABLETS BY MOUTH DAILY., Disp: 45 tablet, Rfl: 2   metFORMIN (GLUCOPHAGE) 500 MG tablet, TAKE 1 TABLET (500 MG TOTAL) BY MOUTH 2 (TWO) TIMES DAILY WITH A MEAL., Disp: 60 tablet, Rfl: 3   methocarbamol (ROBAXIN) 500 MG tablet, Take 1 tablet (500 mg total) by mouth 2 (two) times daily as needed for muscle spasms., Disp: 60 tablet, Rfl: 0   mirtazapine (REMERON) 30 MG tablet, TAKE 1 TABLET (30 MG TOTAL) BY MOUTH AT BEDTIME., Disp: 30  tablet, Rfl: 3   montelukast (SINGULAIR) 10 MG tablet, Take 1 tablet (10 mg total) by mouth at bedtime., Disp: 30 tablet, Rfl: 0   nebivolol (BYSTOLIC) 10 MG tablet, TAKE 1 TABLET (10 MG TOTAL) BY MOUTH DAILY., Disp: 90 tablet, Rfl: 1   nitroGLYCERIN (NITROSTAT) 0.4 MG SL tablet, Place 1 tablet (0.4 mg total) under the tongue every 5 (five) minutes as needed for chest pain., Disp: 25 tablet, Rfl: 3   omeprazole (PRILOSEC) 40 MG capsule, Take 1 capsule (40 mg total) by mouth 2 (two) times daily before a meal., Disp: 60 capsule, Rfl: 2   sodium chloride (OCEAN) 0.65 % SOLN nasal spray, Place 1 spray into both nostrils as needed for congestion., Disp: , Rfl:    Spacer/Aero-Holding Chambers (AEROCHAMBER MV) inhaler, Use as instructed, Disp: 1 each, Rfl: 1   ticagrelor (BRILINTA) 60 MG TABS tablet, TAKE 1 TABLET (60 MG TOTAL) BY MOUTH 2 (TWO) TIMES DAILY., Disp: 60 tablet, Rfl: 5   traMADol (ULTRAM) 50 MG tablet, Take 1 tablet (50 mg total) by mouth every 6 (six) hours as needed., Disp: 40 tablet, Rfl: 0   TRUEPLUS LANCETS 28G MISC, 28 g by Does not apply route QID., Disp: 120 each, Rfl: 2  Observations/Objective: Patient is well-developed, well-nourished in no acute distress.  Resting comfortably at home.  Head is normocephalic, atraumatic.  No labored breathing. Speech is clear and coherent with logical content.  Patient is alert and oriented at baseline.   Assessment and Plan: 1. COPD exacerbation (HCC) - benzonatate (TESSALON) 100 MG capsule; Take 1 capsule (100 mg total) by mouth 3 (three) times daily as needed for cough.  Dispense: 30 capsule; Refill: 0 - predniSONE (DELTASONE) 10 MG tablet; Take 4 tablets (40 mg total) by mouth daily with breakfast for 2 days, THEN 3 tablets (30 mg total) daily with breakfast for 2 days, THEN 2 tablets (20 mg total) daily with breakfast for 2 days, THEN 1 tablet (10 mg total) daily with breakfast for 2 days.  Dispense: 20 tablet; Refill: 0  2. Acute  bacterial bronchitis - benzonatate (TESSALON) 100 MG capsule; Take 1 capsule (100 mg total) by mouth 3 (three) times daily as needed for cough.  Dispense: 30 capsule; Refill: 0 - doxycycline (VIBRA-TABS) 100 MG tablet; Take 1 tablet (100 mg total) by mouth 2 (two) times daily.  Dispense: 14 tablet; Refill: 0  Rx Prednisone  taper for COPD exacerbation. Tessalon per orders. Continue singulair and COPD medications. Will add on Doxycycline.  Increase fluids.  Rest.  Saline nasal spray.  Probiotic.  Mucinex as directed.  Humidifier in bedroom..  Call or return to clinic if symptoms are not improving.   Follow Up Instructions: I discussed the assessment and treatment plan with the patient. The patient was provided an opportunity to ask questions and all were answered. The patient agreed with the plan and demonstrated an understanding of the instructions.  A copy of instructions were sent to the patient via MyChart unless otherwise noted below.    The patient was advised to call back or seek an in-person evaluation if the symptoms worsen or if the condition fails to improve as anticipated.  Time:  I spent 10 minutes with the patient via telehealth technology discussing the above problems/concerns.    [name] Rio, PA-C

## 2022-05-16 NOTE — Telephone Encounter (Signed)
See previous triage from today. Patient called back to reschedule UC VV. See addendum note from today .

## 2022-05-16 NOTE — Telephone Encounter (Signed)
  Chief Complaint: Cough Symptoms: productive cough, clear, wheezing at times, SOB at times. Using nebs "Helps for a while." Subjective fever "Feel warm." Frequency: 2 weeks, seen UC 10/13, placed on prednisone. Pertinent Negatives: Patient denies  Disposition: '[]'$ ED /'[]'$ Urgent Care (no appt availability in office) / '[]'$ Appointment(In office/virtual)/ '[x]'$  [facility]/ '[]'$ Home Care/ '[]'$ Refused Recommended Disposition /'[]'$ [facility]/ '[]'$  Follow-up with PCP Additional Notes: Pt states no transportation. Secured [facility] for this AM. Care advise provided, verbalizes understanding.

## 2022-05-22 ENCOUNTER — Other Ambulatory Visit: Payer: Self-pay | Admitting: Critical Care Medicine

## 2022-05-22 ENCOUNTER — Other Ambulatory Visit: Payer: Self-pay | Admitting: Family Medicine

## 2022-05-22 DIAGNOSIS — I10 Essential (primary) hypertension: Secondary | ICD-10-CM

## 2022-05-23 ENCOUNTER — Other Ambulatory Visit: Payer: Self-pay

## 2022-05-23 ENCOUNTER — Other Ambulatory Visit: Payer: Self-pay | Admitting: Internal Medicine

## 2022-05-23 DIAGNOSIS — E1165 Type 2 diabetes mellitus with hyperglycemia: Secondary | ICD-10-CM

## 2022-05-23 MED ORDER — LOSARTAN POTASSIUM 50 MG PO TABS
ORAL_TABLET | Freq: Every day | ORAL | 0 refills | Status: DC
  Filled 2022-05-23: qty 45, 30d supply, fill #0

## 2022-05-23 MED ORDER — METFORMIN HCL 500 MG PO TABS
ORAL_TABLET | Freq: Two times a day (BID) | ORAL | 1 refills | Status: DC
  Filled 2022-05-23: qty 60, 30d supply, fill #0
  Filled 2022-08-12: qty 60, 30d supply, fill #1

## 2022-05-23 MED ORDER — MONTELUKAST SODIUM 10 MG PO TABS
10.0000 mg | ORAL_TABLET | Freq: Every day | ORAL | 0 refills | Status: DC
  Filled 2022-05-23: qty 90, 90d supply, fill #0

## 2022-05-23 NOTE — Telephone Encounter (Signed)
Requested Prescriptions  Pending Prescriptions Disp Refills   metFORMIN (GLUCOPHAGE) 500 MG tablet 60 tablet 3    Sig: TAKE 1 TABLET (500 MG TOTAL) BY MOUTH 2 (TWO) TIMES DAILY WITH A MEAL.     Endocrinology:  Diabetes - Biguanides Failed - 05/23/2022  2:43 PM      Failed - B12 Level in normal range and within 720 days    No results found for: "VITAMINB12"       Failed - Valid encounter within last 6 months    Recent Outpatient Visits           6 months ago Pulmonary emphysema, unspecified emphysema type [facility])   [place] Elsie Stain, MD   7 months ago Preoperative evaluation to rule out surgical contraindication   [place] Ladell Pier, MD   8 months ago Preoperative evaluation to rule out surgical contraindication   [place], Deborah B, MD   10 months ago COPD with chronic bronchitis [facility]   [place] Karle Plumber B, MD   1 year ago Chronic low back pain, unspecified back pain laterality, unspecified whether sciatica present   [place], [place], NP       Future Appointments             In 2 months Ladell Pier, MD [place] in normal range and within 360 days    Creatinine  Date Value Ref Range Status  02/18/2022 0.86 0.44 - 1.00 mg/dL Final   Creat  Date Value Ref Range Status  04/16/2016 0.93 0.50 - 1.05 mg/dL Final    Comment:      For patients > or = 66 years of age: The upper reference limit for Creatinine is approximately 13% higher for people identified as African-American.      Creatinine,U  Date Value Ref Range Status  01/11/2010 61.7 mg/dL Final    Comment:    See lab report for associated comment(s)         Passed - HBA1C is between 0 and 7.9 and within 180 days    HbA1c, POC (prediabetic range)   Date Value Ref Range Status  06/15/2020 6.0 5.7 - 6.4 % Final   HbA1c, POC (controlled diabetic range)  Date Value Ref Range Status  09/14/2021 5.7 0.0 - 7.0 % Final   Hgb A1c MFr Bld  Date Value Ref Range Status  12/18/2021 5.3 4.8 - 5.6 % Final    Comment:    (NOTE) Pre diabetes:          5.7%-6.4%  Diabetes:              >6.4%  Glycemic control for   <7.0% adults with diabetes          Passed - eGFR in normal range and within 360 days    GFR, Est African American  Date Value Ref Range Status  04/16/2016 78 >=60 mL/min Final   GFR calc Af Amer  Date Value Ref Range Status  04/05/2019 77 >59 mL/min/1.73 Final   GFR, Est Non African American  Date Value Ref Range Status  04/16/2016 67 >=60 mL/min Final   GFR, Estimated  Date Value Ref Range Status  02/18/2022 >60 >60 mL/min Final    Comment:    (  NOTE) Calculated using the CKD-EPI Creatinine Equation (2021)    GFR  Date Value Ref Range Status  08/30/2020 70.52 >60.00 mL/min Final    Comment:    Calculated using the CKD-EPI Creatinine Equation (2021)   eGFR  Date Value Ref Range Status  09/14/2021 73 >59 mL/min/1.73 Final         Passed - CBC within normal limits and completed in the last 12 months    WBC  Date Value Ref Range Status  11/28/2021 7.1 4.0 - 10.5 K/uL Final   WBC Count  Date Value Ref Range Status  02/18/2022 5.8 4.0 - 10.5 K/uL Final   RBC  Date Value Ref Range Status  02/18/2022 5.05 3.87 - 5.11 MIL/uL Final   Hemoglobin  Date Value Ref Range Status  02/18/2022 15.5 (H) 12.0 - 15.0 g/dL Final  09/14/2021 13.0 11.1 - 15.9 g/dL Final   HCT  Date Value Ref Range Status  02/18/2022 46.7 (H) 36.0 - 46.0 % Final   Hematocrit  Date Value Ref Range Status  09/14/2021 41.5 34.0 - 46.6 % Final   MCHC  Date Value Ref Range Status  02/18/2022 33.2 30.0 - 36.0 g/dL Final   [facility]  Date Value Ref Range Status  02/18/2022 30.7 26.0 - 34.0 pg Final   MCV  Date Value Ref Range Status   02/18/2022 92.5 80.0 - 100.0 fL Final  09/14/2021 87 79 - 97 fL Final   No results found for: "PLTCOUNTKUC", "LABPLAT", "POCPLA" RDW  Date Value Ref Range Status  02/18/2022 16.1 (H) 11.5 - 15.5 % Final  09/14/2021 15.8 (H) 11.7 - 15.4 % Final

## 2022-05-23 NOTE — Telephone Encounter (Signed)
Called pt and made appt for 08/02/2022.

## 2022-05-23 NOTE — Telephone Encounter (Signed)
Patient needs OV, will refill until OV is made. Patient needs OV for additional refills.  Requested Prescriptions  Pending Prescriptions Disp Refills   losartan (COZAAR) 50 MG tablet 45 tablet 2    Sig: TAKE 1 & 1/2 TABLETS BY MOUTH DAILY.     Cardiovascular:  Angiotensin Receptor Blockers Failed - 05/22/2022  8:43 PM      Failed - Valid encounter within last 6 months    Recent Outpatient Visits           6 months ago Pulmonary emphysema, unspecified emphysema type ([place])   [place] Elsie Stain, MD   7 months ago Preoperative evaluation to rule out surgical contraindication   [place] Ladell Pier, MD   8 months ago Preoperative evaluation to rule out surgical contraindication   [place] Ladell Pier, MD   10 months ago COPD with chronic bronchitis [facility])   [place] Ladell Pier, MD   1 year ago Chronic low back pain, unspecified back pain laterality, unspecified whether sciatica present   [place] Kerin Perna, NP              Passed - Cr in normal range and within 180 days    Creatinine  Date Value Ref Range Status  02/18/2022 0.86 0.44 - 1.00 mg/dL Final   Creat  Date Value Ref Range Status  04/16/2016 0.93 0.50 - 1.05 mg/dL Final    Comment:      For patients > or = 66 years of age: The upper reference limit for Creatinine is approximately 13% higher for people identified as African-American.      Creatinine,U  Date Value Ref Range Status  01/11/2010 61.7 mg/dL Final    Comment:    See lab report for associated comment(s)         Passed - K in normal range and within 180 days    Potassium  Date Value Ref Range Status  02/18/2022 4.2 3.5 - 5.1 mmol/L Final         Passed - Patient is not pregnant      Passed - Last BP in normal range    BP Readings from Last 1  Encounters:  04/26/22 106/77

## 2022-05-23 NOTE — Telephone Encounter (Signed)
Requested Prescriptions  Pending Prescriptions Disp Refills   montelukast (SINGULAIR) 10 MG tablet 90 tablet 0    Sig: Take 1 tablet (10 mg total) by mouth at bedtime.     Pulmonology:  Leukotriene Inhibitors Passed - 05/22/2022  8:43 PM      Passed - Valid encounter within last 12 months    Recent Outpatient Visits           6 months ago Pulmonary emphysema, unspecified emphysema type [facility])   [place] Elsie Stain, MD   7 months ago Preoperative evaluation to rule out surgical contraindication   [place], MD   8 months ago Preoperative evaluation to rule out surgical contraindication   [place], MD   10 months ago COPD with chronic bronchitis [facility])   [place] Ladell Pier, MD   1 year ago Chronic low back pain, unspecified back pain laterality, unspecified whether sciatica present   [place], [place], NP       Future Appointments             In 2 months Wynetta Emery Dalbert Batman, MD [place]

## 2022-05-24 ENCOUNTER — Other Ambulatory Visit: Payer: Self-pay

## 2022-05-28 ENCOUNTER — Ambulatory Visit: Payer: Medicare Other | Admitting: Gastroenterology

## 2022-05-28 ENCOUNTER — Encounter (HOSPITAL_COMMUNITY): Payer: Self-pay

## 2022-05-30 ENCOUNTER — Telehealth (HOSPITAL_COMMUNITY): Payer: Medicare Other | Admitting: Physician Assistant

## 2022-06-10 ENCOUNTER — Other Ambulatory Visit: Payer: Self-pay

## 2022-06-13 ENCOUNTER — Other Ambulatory Visit: Payer: Self-pay | Admitting: Internal Medicine

## 2022-06-13 ENCOUNTER — Other Ambulatory Visit: Payer: Self-pay | Admitting: Family Medicine

## 2022-06-13 DIAGNOSIS — E039 Hypothyroidism, unspecified: Secondary | ICD-10-CM

## 2022-06-13 DIAGNOSIS — I10 Essential (primary) hypertension: Secondary | ICD-10-CM

## 2022-06-14 ENCOUNTER — Encounter: Payer: Self-pay | Admitting: Hematology

## 2022-06-14 ENCOUNTER — Other Ambulatory Visit: Payer: Self-pay

## 2022-06-14 MED ORDER — METHOCARBAMOL 500 MG PO TABS
500.0000 mg | ORAL_TABLET | Freq: Two times a day (BID) | ORAL | 0 refills | Status: DC | PRN
  Filled 2022-06-14: qty 60, 30d supply, fill #0

## 2022-06-14 MED ORDER — AMLODIPINE BESYLATE 5 MG PO TABS
5.0000 mg | ORAL_TABLET | Freq: Every day | ORAL | 0 refills | Status: DC
  Filled 2022-06-14: qty 90, 90d supply, fill #0

## 2022-06-14 MED ORDER — LEVOTHYROXINE SODIUM 112 MCG PO TABS
112.0000 ug | ORAL_TABLET | Freq: Every day | ORAL | 0 refills | Status: DC
  Filled 2022-06-14: qty 30, 30d supply, fill #0

## 2022-06-14 NOTE — Telephone Encounter (Signed)
Requested medication (s) are due for refill today: Yes  Requested medication (s) are on the active medication list: Yes  Last refill:  04/23/22  Future visit scheduled: Yes  Notes to clinic:  See request.    Requested Prescriptions  Pending Prescriptions Disp Refills   methocarbamol (ROBAXIN) 500 MG tablet 60 tablet 0    Sig: Take 1 tablet (500 mg total) by mouth 2 (two) times daily as needed for muscle spasms.     Not Delegated - Analgesics:  Muscle Relaxants Failed - 06/13/2022  8:33 PM      Failed - This refill cannot be delegated      Failed - Valid encounter within last 6 months    Recent Outpatient Visits           7 months ago Pulmonary emphysema, unspecified emphysema type [facility])   [place] Elsie Stain, MD   7 months ago Preoperative evaluation to rule out surgical contraindication   [place] Ladell Pier, MD   9 months ago Preoperative evaluation to rule out surgical contraindication   [place] Ladell Pier, MD   11 months ago COPD with chronic bronchitis [facility])   [place] Ladell Pier, MD   1 year ago Chronic low back pain, unspecified back pain laterality, unspecified whether sciatica present   [place], Michelle P, NP       Future Appointments             In 1 month Ladell Pier, MD [place]            Signed Prescriptions Disp Refills   amLODipine (NORVASC) 5 MG tablet 90 tablet 0    Sig: TAKE 1 TABLET (5 MG TOTAL) BY MOUTH DAILY.     Cardiovascular: Calcium Channel Blockers 2 Failed - 06/13/2022  8:33 PM      Failed - Valid encounter within last 6 months    Recent Outpatient Visits           7 months ago Pulmonary emphysema, unspecified emphysema type [facility])   [place] Elsie Stain, MD    7 months ago Preoperative evaluation to rule out surgical contraindication   [place], MD   9 months ago Preoperative evaluation to rule out surgical contraindication   [place], Deborah B, MD   11 months ago COPD with chronic bronchitis [facility])   [place]  Ladell Pier, MD   1 year ago Chronic low back pain, unspecified back pain laterality, unspecified whether sciatica present   [place], [place], NP       Future Appointments             In 1 month Ladell Pier, MD [place] BP in normal range    BP Readings from Last 1 Encounters:  04/26/22 106/77         Passed - Last Heart Rate in normal range    Pulse Readings from Last 1 Encounters:  04/26/22 (!) 53

## 2022-06-17 ENCOUNTER — Other Ambulatory Visit: Payer: Self-pay | Admitting: Internal Medicine

## 2022-06-17 ENCOUNTER — Other Ambulatory Visit: Payer: Self-pay

## 2022-06-17 DIAGNOSIS — J4489 Other specified chronic obstructive pulmonary disease: Secondary | ICD-10-CM

## 2022-06-17 MED ORDER — ALBUTEROL SULFATE HFA 108 (90 BASE) MCG/ACT IN AERS
INHALATION_SPRAY | RESPIRATORY_TRACT | 0 refills | Status: DC
  Filled 2022-06-17: qty 18, 25d supply, fill #0

## 2022-07-01 ENCOUNTER — Other Ambulatory Visit: Payer: Self-pay | Admitting: Family Medicine

## 2022-07-01 ENCOUNTER — Other Ambulatory Visit: Payer: Self-pay | Admitting: Internal Medicine

## 2022-07-01 ENCOUNTER — Other Ambulatory Visit: Payer: Self-pay

## 2022-07-01 DIAGNOSIS — J4489 Other specified chronic obstructive pulmonary disease: Secondary | ICD-10-CM

## 2022-07-01 MED ORDER — ALBUTEROL SULFATE HFA 108 (90 BASE) MCG/ACT IN AERS
2.0000 | INHALATION_SPRAY | Freq: Four times a day (QID) | RESPIRATORY_TRACT | 0 refills | Status: DC | PRN
  Filled 2022-07-01: qty 18, fill #0
  Filled 2022-07-05: qty 18, 30d supply, fill #0
  Filled 2022-09-16: qty 6.7, 25d supply, fill #0
  Filled 2022-10-14: qty 6.7, 17d supply, fill #1
  Filled 2022-10-14: qty 6.7, 25d supply, fill #1

## 2022-07-01 MED ORDER — TRELEGY ELLIPTA 100-62.5-25 MCG/ACT IN AEPB
1.0000 | INHALATION_SPRAY | Freq: Every day | RESPIRATORY_TRACT | 0 refills | Status: DC
  Filled 2022-07-01 – 2022-07-02 (×2): qty 60, 30d supply, fill #0

## 2022-07-02 ENCOUNTER — Other Ambulatory Visit: Payer: Self-pay | Admitting: Internal Medicine

## 2022-07-02 ENCOUNTER — Other Ambulatory Visit: Payer: Self-pay

## 2022-07-02 ENCOUNTER — Encounter: Payer: Self-pay | Admitting: Hematology

## 2022-07-02 ENCOUNTER — Other Ambulatory Visit: Payer: Self-pay | Admitting: Physician Assistant

## 2022-07-02 DIAGNOSIS — I10 Essential (primary) hypertension: Secondary | ICD-10-CM

## 2022-07-02 DIAGNOSIS — K219 Gastro-esophageal reflux disease without esophagitis: Secondary | ICD-10-CM

## 2022-07-03 ENCOUNTER — Other Ambulatory Visit: Payer: Self-pay

## 2022-07-03 MED ORDER — LOSARTAN POTASSIUM 50 MG PO TABS
75.0000 mg | ORAL_TABLET | Freq: Every day | ORAL | 0 refills | Status: DC
  Filled 2022-07-03: qty 45, 30d supply, fill #0

## 2022-07-03 MED ORDER — OMEPRAZOLE 40 MG PO CPDR
40.0000 mg | DELAYED_RELEASE_CAPSULE | Freq: Two times a day (BID) | ORAL | 5 refills | Status: DC
  Filled 2022-07-03: qty 60, 30d supply, fill #0
  Filled 2022-08-12: qty 60, 30d supply, fill #1
  Filled 2022-09-05: qty 60, 30d supply, fill #2
  Filled 2022-11-03: qty 60, 30d supply, fill #3
  Filled 2022-12-20: qty 60, 30d supply, fill #4
  Filled 2023-01-14: qty 60, 30d supply, fill #5

## 2022-07-05 ENCOUNTER — Encounter: Payer: Self-pay | Admitting: Hematology

## 2022-07-05 ENCOUNTER — Other Ambulatory Visit: Payer: Self-pay

## 2022-07-09 ENCOUNTER — Other Ambulatory Visit: Payer: Self-pay

## 2022-07-16 ENCOUNTER — Other Ambulatory Visit: Payer: Self-pay

## 2022-07-24 ENCOUNTER — Other Ambulatory Visit (HOSPITAL_COMMUNITY): Payer: Self-pay | Admitting: Physician Assistant

## 2022-07-24 DIAGNOSIS — F411 Generalized anxiety disorder: Secondary | ICD-10-CM

## 2022-07-26 ENCOUNTER — Other Ambulatory Visit: Payer: Self-pay

## 2022-07-26 ENCOUNTER — Other Ambulatory Visit: Payer: Self-pay | Admitting: Internal Medicine

## 2022-07-26 ENCOUNTER — Encounter: Payer: Self-pay | Admitting: Hematology

## 2022-07-26 DIAGNOSIS — E039 Hypothyroidism, unspecified: Secondary | ICD-10-CM

## 2022-07-26 MED ORDER — LEVOTHYROXINE SODIUM 112 MCG PO TABS
112.0000 ug | ORAL_TABLET | Freq: Every day | ORAL | 0 refills | Status: DC
  Filled 2022-07-26: qty 30, 30d supply, fill #0

## 2022-07-26 NOTE — Addendum Note (Signed)
Addended by: Evelena Asa F on: 07/26/2022 04:31 PM   Modules accepted: Orders

## 2022-07-29 ENCOUNTER — Other Ambulatory Visit: Payer: Self-pay

## 2022-07-29 ENCOUNTER — Other Ambulatory Visit: Payer: Self-pay | Admitting: Internal Medicine

## 2022-07-29 DIAGNOSIS — J4489 Other specified chronic obstructive pulmonary disease: Secondary | ICD-10-CM

## 2022-07-30 ENCOUNTER — Other Ambulatory Visit: Payer: Self-pay

## 2022-07-30 ENCOUNTER — Encounter: Payer: Self-pay | Admitting: Hematology

## 2022-07-30 IMAGING — MR MR CERVICAL SPINE W/O CM
4 of 5 series · 19 of 48 positions shown · non-contrast
Comparison: CT 04/12/2012

CLINICAL DATA: Chronic neck pain

EXAM:
MRI CERVICAL SPINE WITHOUT CONTRAST
TECHNIQUE: Multiplanar, multisequence MR imaging of the cervical spine was
performed. No intravenous contrast was administered.

[Series 2: T2 · sagittal · 3.0mm · 0.43mm/px · 5 of 16 slices shown (1 of 2)]
[im 1/16]
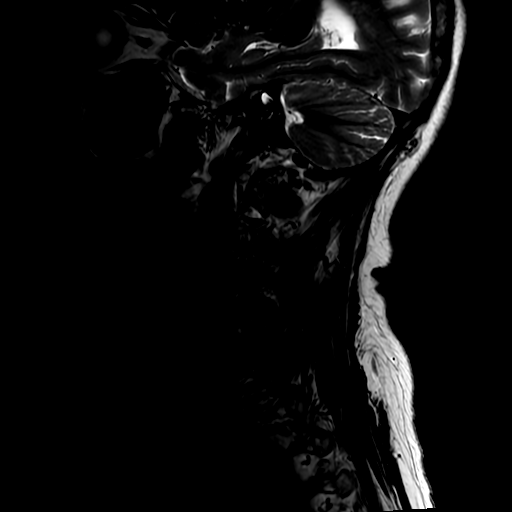
[im 4/16]
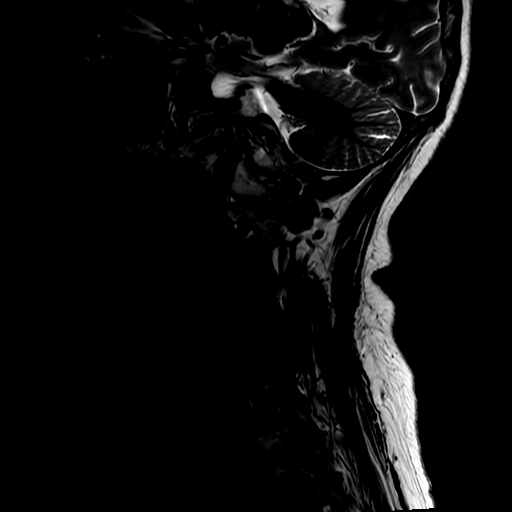
[im 8/16]
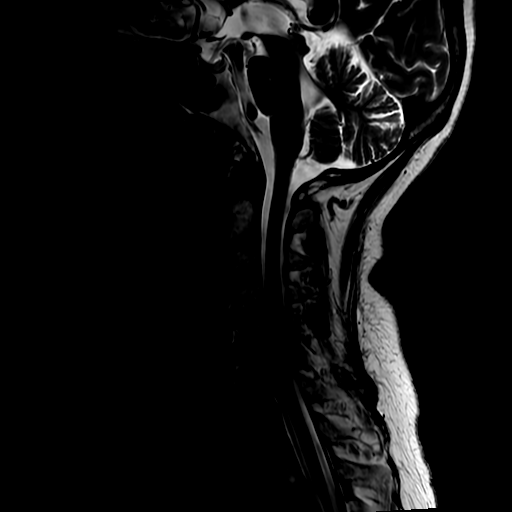
[im 12/16]
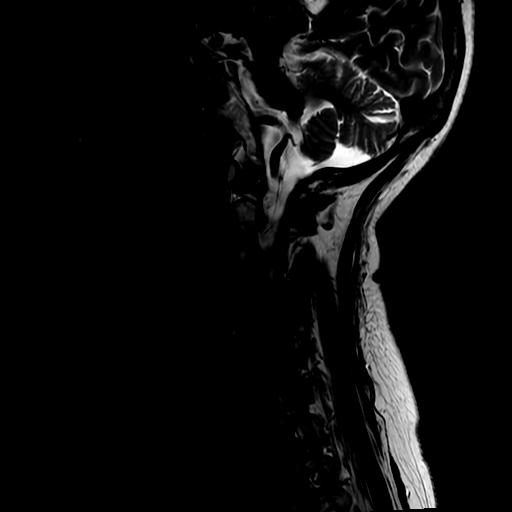
[im 16/16]
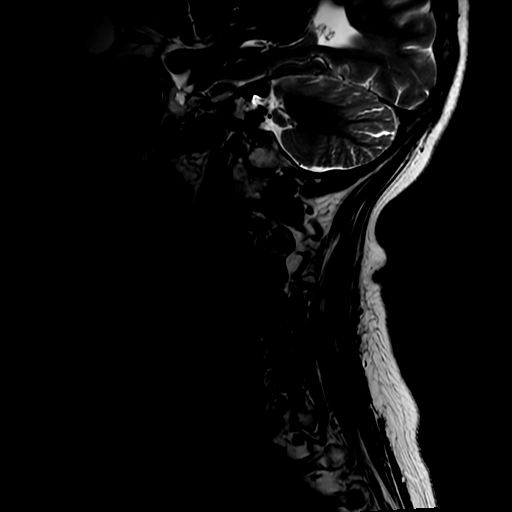

[Series 3: FLAIR · sagittal · 3.0mm · 0.43mm/px · 3 of 16 slices shown]
[im 1/16]
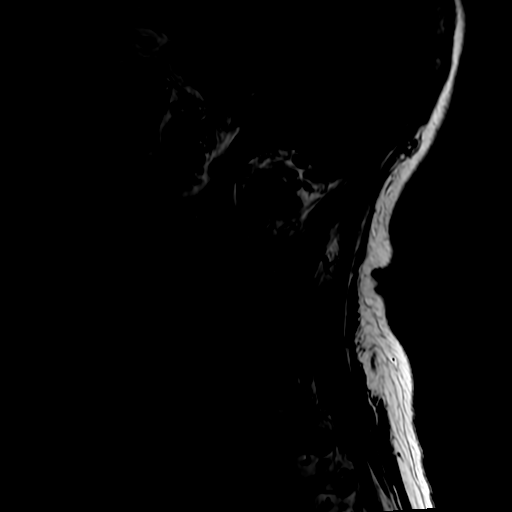
[im 11/16]
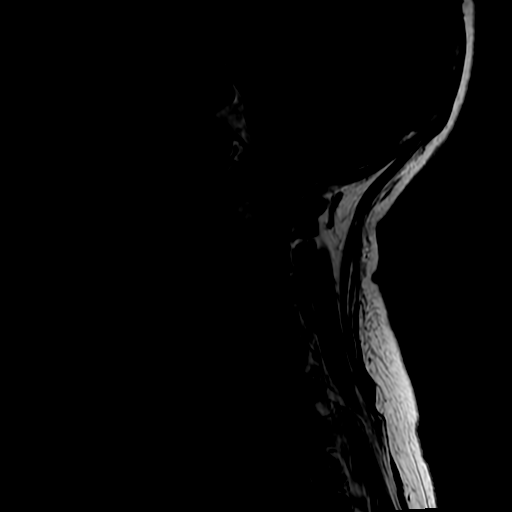
[im 16/16]
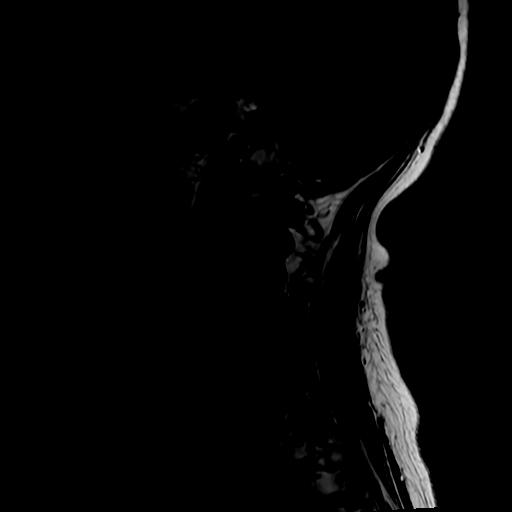

[Series 4: STIR · sagittal · 3.0mm · 0.43mm/px · 3 of 16 slices shown]
[im 1/16]
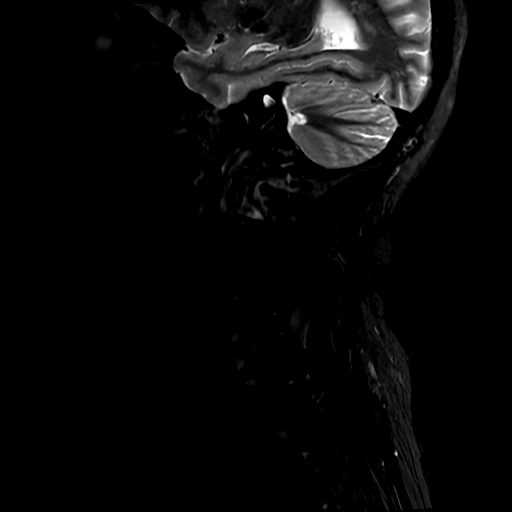
[im 11/16]
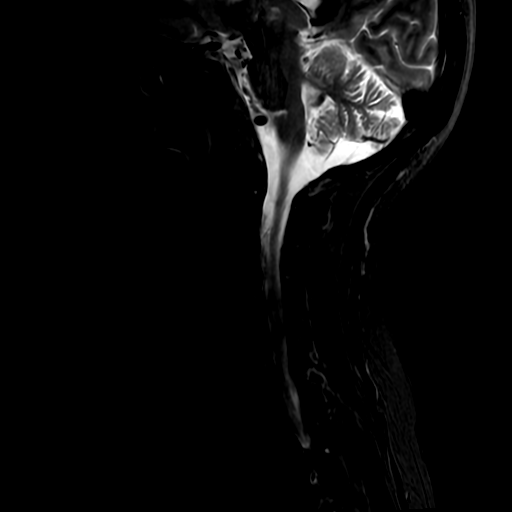
[im 16/16]
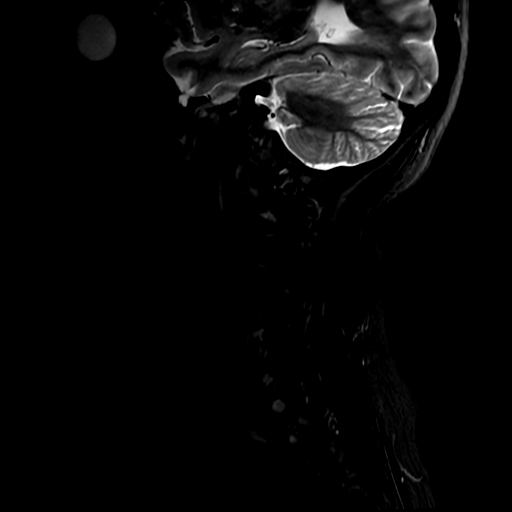

[Series 6: T2 · axial · 3.0mm · 0.35mm/px · z∈[-140,-51]mm · 8 of 32 slices shown (2 of 2)]
[im 1/32]
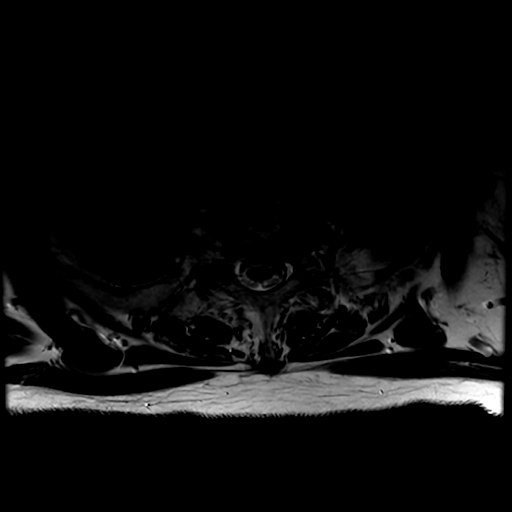
[im 4/32]
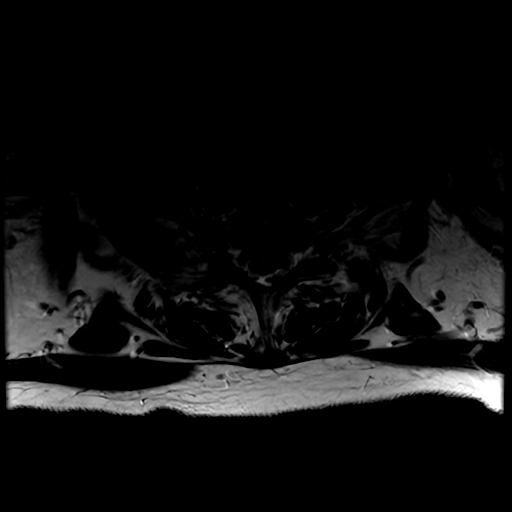
[im 8/32]
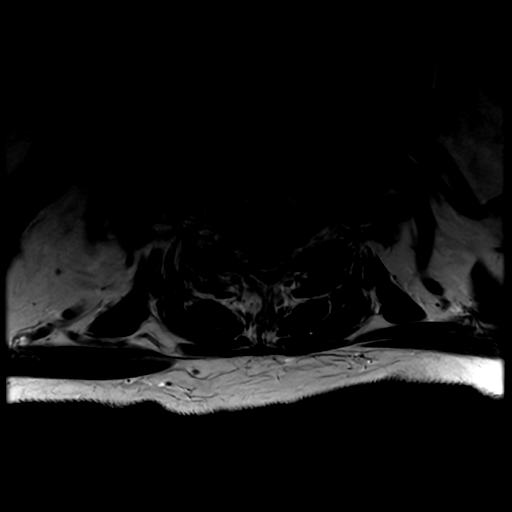
[im 12/32]
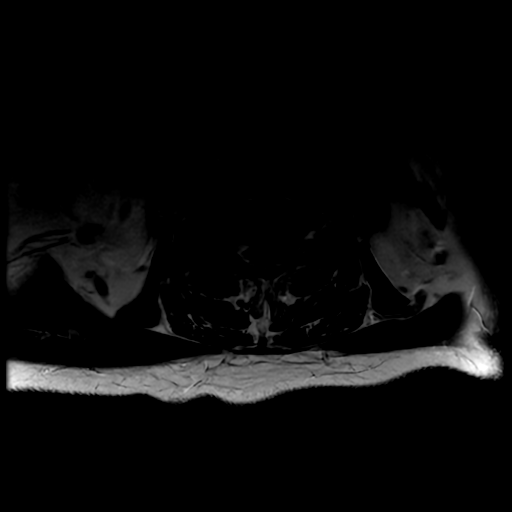
[im 16/32]
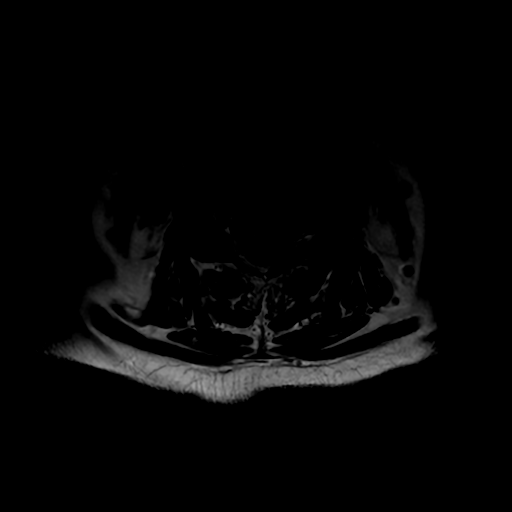
[im 20/32]
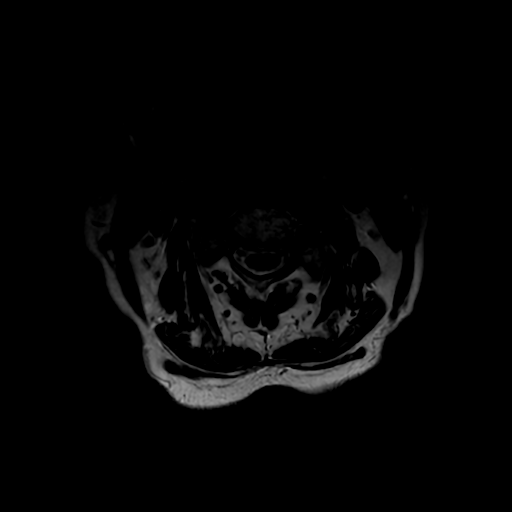
[im 24/32]
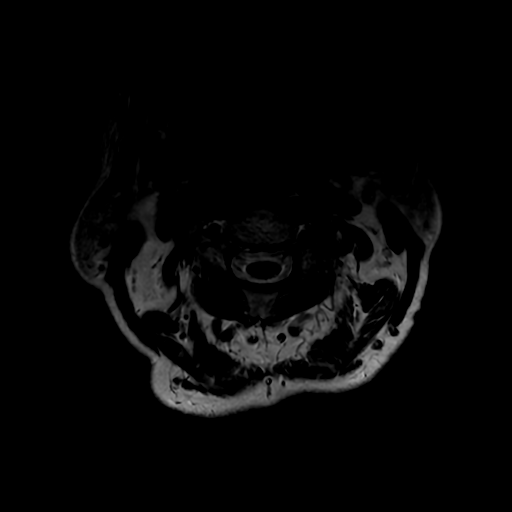
[im 28/32]
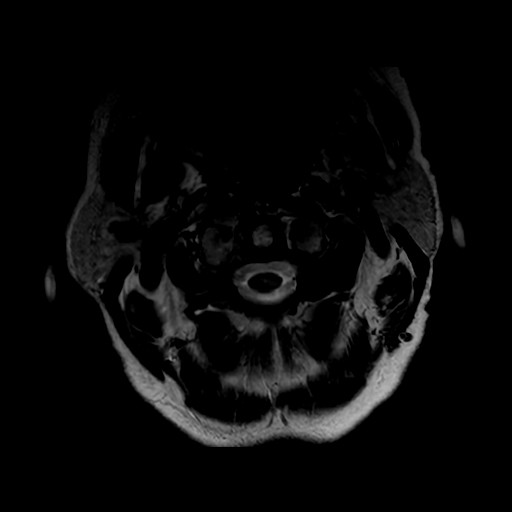

[19 of 48 positions shown; findings below may reference images not displayed]

FINDINGS: Alignment: Physiologic.

Vertebrae: No fracture, evidence of discitis, or bone lesion.

Cord: Normal signal and morphology.

Posterior Fossa, vertebral arteries, paraspinal tissues: Negative.

Disc levels:

C2-C3: No significant disc protrusion. Unremarkable facet joints. No
foraminal or canal stenosis.

C3-C4: No significant disc protrusion. Unremarkable facet joints.
Mild right uncovertebral spurring. Mild right foraminal stenosis. No
left foraminal or canal stenosis.

C4-C5: Central disc protrusion abuts the ventral cord resulting in
mild canal stenosis. Right greater than left facet and uncovertebral
arthropathy result in mild right foraminal stenosis.

C5-C6: Small central disc protrusion abuts the ventral cord
resulting in mild canal stenosis. No foraminal stenosis.

C6-C7: Disc osteophyte complex with right paracentral disc
protrusion resulting in moderate right foraminal stenosis. Mild left
foraminal stenosis. Mild canal stenosis.

C7-T1: No significant disc protrusion. Unremarkable facet joints. No
foraminal or canal stenosis.
IMPRESSION: 1. Cervical spondylosis most pronounced at the C6-7 level where
there is a disc osteophyte complex with right paracentral disc
protrusion resulting in moderate right foraminal stenosis and mild
left foraminal stenosis as well as mild canal stenosis.
2. Central disc protrusions at C4-5 and C5-6 contribute to mild
canal stenosis at both levels.

## 2022-07-30 MED ORDER — ALBUTEROL SULFATE HFA 108 (90 BASE) MCG/ACT IN AERS
INHALATION_SPRAY | RESPIRATORY_TRACT | 0 refills | Status: DC
  Filled 2022-07-30: qty 18, 25d supply, fill #0

## 2022-07-30 NOTE — Telephone Encounter (Signed)
Patient must have a follow up appointment before medication can be refilled. Patient was last seen by this provider on 02/28/2022.

## 2022-07-30 NOTE — Telephone Encounter (Signed)
Requested Prescriptions  Pending Prescriptions Disp Refills   albuterol (VENTOLIN HFA) 108 (90 Base) MCG/ACT inhaler 18 g 0    Sig: INHALE 2 PUFFS INTO THE LUNGS EVERY 6 (SIX) HOURS AS NEEDED FOR WHEEZING OR SHORTNESS OF BREATH.     Pulmonology:  Beta Agonists 2 Passed - 07/29/2022 10:20 AM      Passed - Last BP in normal range    BP Readings from Last 1 Encounters:  04/26/22 106/77         Passed - Last Heart Rate in normal range    Pulse Readings from Last 1 Encounters:  04/26/22 (!) 53         Passed - Valid encounter within last 12 months    Recent Outpatient Visits           8 months ago Pulmonary emphysema, unspecified emphysema type ([place])   [place] Elsie Stain, MD   9 months ago Preoperative evaluation to rule out surgical contraindication   [place], MD   10 months ago Preoperative evaluation to rule out surgical contraindication   [place], Deborah B, MD   1 year ago COPD with chronic bronchitis [facility])   [place] Ladell Pier, MD   1 year ago Chronic low back pain, unspecified back pain laterality, unspecified whether sciatica present   [place], [place], NP       Future Appointments             In 3 days Ladell Pier, MD [place]

## 2022-08-02 ENCOUNTER — Encounter: Payer: Self-pay | Admitting: Hematology

## 2022-08-02 ENCOUNTER — Ambulatory Visit: Payer: Medicare Other | Attending: Internal Medicine | Admitting: Internal Medicine

## 2022-08-02 ENCOUNTER — Other Ambulatory Visit: Payer: Self-pay

## 2022-08-02 ENCOUNTER — Encounter: Payer: Self-pay | Admitting: Internal Medicine

## 2022-08-02 VITALS — BP 140/82 | HR 63 | Temp 97.6°F | Ht 62.0 in | Wt 152.0 lb

## 2022-08-02 DIAGNOSIS — M25511 Pain in right shoulder: Secondary | ICD-10-CM | POA: Diagnosis not present

## 2022-08-02 DIAGNOSIS — J441 Chronic obstructive pulmonary disease with (acute) exacerbation: Secondary | ICD-10-CM | POA: Insufficient documentation

## 2022-08-02 DIAGNOSIS — J988 Other specified respiratory disorders: Secondary | ICD-10-CM | POA: Diagnosis not present

## 2022-08-02 DIAGNOSIS — B349 Viral infection, unspecified: Secondary | ICD-10-CM | POA: Diagnosis not present

## 2022-08-02 DIAGNOSIS — F17200 Nicotine dependence, unspecified, uncomplicated: Secondary | ICD-10-CM | POA: Diagnosis not present

## 2022-08-02 DIAGNOSIS — B9789 Other viral agents as the cause of diseases classified elsewhere: Secondary | ICD-10-CM

## 2022-08-02 DIAGNOSIS — I10 Essential (primary) hypertension: Secondary | ICD-10-CM | POA: Diagnosis not present

## 2022-08-02 MED ORDER — BENZONATATE 200 MG PO CAPS
200.0000 mg | ORAL_CAPSULE | Freq: Two times a day (BID) | ORAL | 0 refills | Status: DC | PRN
  Filled 2022-08-02: qty 20, 10d supply, fill #0

## 2022-08-02 MED ORDER — PREDNISONE 20 MG PO TABS
40.0000 mg | ORAL_TABLET | Freq: Every day | ORAL | 0 refills | Status: DC
  Filled 2022-08-02: qty 10, 5d supply, fill #0

## 2022-08-02 MED ORDER — DOXYCYCLINE HYCLATE 100 MG PO TABS
100.0000 mg | ORAL_TABLET | Freq: Two times a day (BID) | ORAL | 0 refills | Status: DC
  Filled 2022-08-02: qty 14, 7d supply, fill #0

## 2022-08-02 NOTE — Progress Notes (Signed)
Patient ID: [name] [name], female    DOB: 26-Jan-1956  MRN: [id number]  CC: Hypertension (Htn f/u. Albin Fischer, sneezing, nausea X2 weeks/Pain on R arm, swollen, aching, unable to lift arm all the way X1 week)   Subjective: [name] [name] is a 67 y.o. female who presents for UC visit Her concerns today include:  Patient with history of pre-DM, GERD, hypothyroidism, HTN, HL, CAD with previous stent, COPD, substance abuse, depression/anxiety and tobacco dependence, essential thrombocytosis, iron deficiency anemia  Patient complains of cough productive of grayish phlegm, sneezing, increased shortness of breath, wheezing and congestion x 2 weeks.  No fever.  This has been associated with nausea.  Reports sick contacts within her immediate family.  She has not had a flu shot or a COVID booster.  She has been using her albuterol inhaler more frequently about every 2-3 hours.  Compliant with using Trelegy inhaler once a day.  She has had increased use of her nebulizer 1-2 times a day. She continues to smoke about a pack a day.  Complains of pain in the right shoulder over the past week.  She is having problems with elevation of the arm above the head.  No initiating factors.  She had rotator cuff repair surgery on the shoulder by Dr. Lorin Mercy in June of last year. Patient Active Problem List   Diagnosis Date Noted   Nontraumatic complete tear of right rotator cuff    Superior glenoid labrum lesion of left shoulder    Iron deficiency anemia 11/02/2021   Major depressive disorder, recurrent, in full remission with anxious distress ([place]) 05/31/2021   Rotator cuff tendinitis, right 05/08/2021   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   Chronic neck pain 02/26/2021   GAD (generalized anxiety disorder) 01/08/2021   Acute cystitis without hematuria 11/07/2020   Influenza vaccine needed 06/15/2020   Abdominal pain, epigastric 04/28/2019   Esophageal dysphagia 04/28/2019   Bipolar I disorder ([place])  09/17/2018   Insomnia 10/28/2017   Prediabetes 05/28/2017   QT prolongation 03/25/2017   Encounter for screening mammogram for breast cancer 09/11/2015   Psoriasis 06/22/2015   COPD (chronic obstructive pulmonary disease) (HCC)GOLD E 05/18/2015   Anxiety and depression 07/10/2013   Essential hypertension, benign 06/07/2013   Dyslipidemia 06/07/2013   Asthma, chronic 06/07/2013   Emphysema lung ([place]) 06/07/2013   Chronic back pain 06/07/2013   CAD S/P percutaneous coronary angioplasty 06/07/2013   GERD (gastroesophageal reflux disease) 06/07/2013   Breast lump on left side at 3 o'clock position 10/13/2012   S/P abdominal hysterectomy and right salpingo-oophorectomy 08/29/2011   TOBACCO ABUSE 07/17/2009   Chronic rhinitis 07/17/2009   Lung nodule < 6cm on CT 07/17/2009   ALLERGY, FOOD 07/17/2009   Hypothyroidism 12/22/2008     Current Outpatient Medications on File Prior to Visit  Medication Sig Dispense Refill   albuterol (VENTOLIN HFA) 108 (90 Base) MCG/ACT inhaler INHALE 2 PUFFS INTO THE LUNGS EVERY 6 (SIX) HOURS AS NEEDED FOR WHEEZING OR SHORTNESS OF BREATH. 18 g 0   albuterol (VENTOLIN HFA) 108 (90 Base) MCG/ACT inhaler INHALE 2 PUFFS INTO THE LUNGS EVERY 6 (SIX) HOURS AS NEEDED FOR WHEEZING OR SHORTNESS OF BREATH. 18 g 0   amLODipine (NORVASC) 5 MG tablet Take 1 tablet (5 mg total) by mouth daily. 90 tablet 0   aspirin EC 81 MG tablet Take 81 mg by mouth daily.     atorvastatin (LIPITOR) 80 MG tablet Take 1 tablet (80 mg total) by mouth daily. please  schedule appointment for more refills 90 tablet 3   benzonatate (TESSALON) 100 MG capsule Take 1 capsule (100 mg total) by mouth 3 (three) times daily as needed for cough. 30 capsule 0   Blood Glucose Monitoring Suppl (TRUE METRIX METER) DEVI 1 kit by Does not apply route 4 (four) times daily. 1 Device 0   busPIRone (BUSPAR) 15 MG tablet TAKE 2 TABLETS IN THE MORNING, 1 TAB AT NOON AND 1 TAB IN THE EVENING 120 tablet 3    Chlorpheniramine-DM (COUGH & COLD HBP PO) Take 1 tablet by mouth daily as needed (congestion).     clonazePAM (KLONOPIN) 0.5 MG tablet Take 1 tablet (0.5 mg total) by mouth 2 (two) times daily as needed for anxiety. 60 tablet 1   doxepin (SINEQUAN) 75 MG capsule Take 1 capsule (75 mg total) by mouth at bedtime. 30 capsule 3   doxycycline (VIBRA-TABS) 100 MG tablet Take 1 tablet (100 mg total) by mouth 2 (two) times daily. 14 tablet 0   escitalopram (LEXAPRO) 10 MG tablet TAKE 3 TABLETS (30 MG TOTAL) BY MOUTH DAILY. 90 tablet 3   ferrous sulfate 325 (65 FE) MG tablet Take 1 tablet (325 mg total) by mouth daily with breakfast. 100 tablet 1   fluticasone (FLONASE) 50 MCG/ACT nasal spray Place 2 sprays into both nostrils daily. 16 g 0   Fluticasone-Umeclidin-Vilant (TRELEGY ELLIPTA) 100-62.5-25 MCG/ACT AEPB Inhale 1 puff into the lungs daily. 60 each 0   furosemide (LASIX) 20 MG tablet Take 1 tablet (20 mg total) by mouth daily. 30 tablet 2   gabapentin (NEURONTIN) 600 MG tablet TAKE 1 TABLET BY MOUTH 4 TIMES DAILY 360 tablet 1   glucose blood (TRUE METRIX BLOOD GLUCOSE TEST) test strip Use as instructed 100 each 12   ipratropium-albuterol (DUONEB) 0.5-2.5 (3) MG/3ML SOLN TAKE 3 MLS VIA NEBULIZATION EVERY 6 HOURS 360 mL 1   levothyroxine (SYNTHROID) 112 MCG tablet Take 1 tablet (112 mcg total) by mouth daily. 30 tablet 0   loperamide (IMODIUM A-D) 2 MG tablet Take 4 mg by mouth 4 (four) times daily as needed for diarrhea or loose stools.     losartan (COZAAR) 50 MG tablet Take 1.5 tablets (75 mg total) by mouth daily. 45 tablet 0   metFORMIN (GLUCOPHAGE) 500 MG tablet TAKE 1 TABLET (500 MG TOTAL) BY MOUTH 2 (TWO) TIMES DAILY WITH A MEAL. 60 tablet 1   methocarbamol (ROBAXIN) 500 MG tablet Take 1 tablet (500 mg total) by mouth 2 (two) times daily as needed for muscle spasms. (Must have office visit for refills.) 60 tablet 0   mirtazapine (REMERON) 30 MG tablet TAKE 1 TABLET (30 MG TOTAL) BY MOUTH AT  BEDTIME. 30 tablet 3   montelukast (SINGULAIR) 10 MG tablet Take 1 tablet (10 mg total) by mouth at bedtime. 90 tablet 0   nebivolol (BYSTOLIC) 10 MG tablet TAKE 1 TABLET (10 MG TOTAL) BY MOUTH DAILY. 90 tablet 1   omeprazole (PRILOSEC) 40 MG capsule Take 1 capsule (40 mg total) by mouth 2 (two) times daily before a meal. 60 capsule 5   sodium chloride (OCEAN) 0.65 % SOLN nasal spray Place 1 spray into both nostrils as needed for congestion.     Spacer/Aero-Holding Chambers (AEROCHAMBER MV) inhaler Use as instructed 1 each 1   ticagrelor (BRILINTA) 60 MG TABS tablet TAKE 1 TABLET (60 MG TOTAL) BY MOUTH 2 (TWO) TIMES DAILY. 60 tablet 5   traMADol (ULTRAM) 50 MG tablet Take 1 tablet (50 mg  total) by mouth every 6 (six) hours as needed. 40 tablet 0   TRUEPLUS LANCETS 28G MISC 28 g by Does not apply route QID. 120 each 2   nitroGLYCERIN (NITROSTAT) 0.4 MG SL tablet Place 1 tablet (0.4 mg total) under the tongue every 5 (five) minutes as needed for chest pain. 25 tablet 3   No current facility-administered medications on file prior to visit.    Allergies  Allergen Reactions   Avocado Anaphylaxis   Latex Shortness Of Breath and Rash   Codeine Nausea Only    Social History   Socioeconomic History   Marital status: Divorced    Spouse name: Not on file   Number of children: 1   Years of education: Not on file   Highest education level: 9th grade  Occupational History   Not on file  Tobacco Use   Smoking status: Every Day    Packs/day: 0.50    Years: 50.00    Total pack years: 25.00    Types: Cigarettes   Smokeless tobacco: Never  Vaping Use   Vaping Use: Some days   Substances: Nicotine  Substance and Sexual Activity   Alcohol use: Yes    Comment: once a week   Drug use: Yes    Types: Marijuana    Comment: history of cocaine use, been about a year, per patient (12/18/21)   Sexual activity: Not Currently    Birth control/protection: Surgical  Other Topics Concern   Not on file   Social History Narrative   Not on file   Social Determinants of Health   Financial Resource Strain: Low Risk  (06/09/2018)   Overall Financial Resource Strain (CARDIA)    Difficulty of Paying Living Expenses: Not hard at all  Food Insecurity: Food Insecurity Present (12/20/2021)   Hunger Vital Sign    Worried About [place] in the Last Year: Sometimes true    Ran Out of Food in the Last Year: Sometimes true  Transportation Needs: Unmet Transportation Needs (12/20/2021)   PRAPARE - Hydrologist (Medical): Yes    Lack of Transportation (Non-Medical): No  Physical Activity: Inactive (12/20/2021)   Exercise Vital Sign    Days of Exercise per Week: 0 days    Minutes of Exercise per Session: 0 min  Stress: Stress Concern Present (12/20/2021)   [place]    Feeling of Stress : To some extent  Social Connections: Moderately Isolated (12/20/2021)   Social Connection and Isolation Panel [NHANES]    Frequency of Communication with Friends and Family: More than three times a week    Frequency of Social Gatherings with Friends and Family: More than three times a week    Attends Religious Services: 1 to 4 times per year    Active Member of [employer] or Organizations: No    Attends Archivist Meetings: Never    Marital Status: Divorced  Human resources officer Violence: Not At Risk (12/20/2021)   Humiliation, Afraid, Rape, and Kick questionnaire    Fear of Current or Ex-Partner: No    Emotionally Abused: No    Physically Abused: No    Sexually Abused: No    Family History  Problem Relation Age of Onset   Heart disease Mother    Hypertension Mother    Stroke Mother    Mental illness Mother    Heart disease Father    Hypertension Father    Diabetes Father  Colon polyps Brother    Breast cancer Maternal Aunt    Throat cancer Maternal Uncle    Thyroid disease Paternal Aunt    Heart  disease Maternal Grandfather    Cancer Paternal Grandmother        mouth   Anesthesia problems Daughter    Hypotension Neg Hx    Malignant hyperthermia Neg Hx    Pseudochol deficiency Neg Hx    Colon cancer Neg Hx    Stomach cancer Neg Hx    Esophageal cancer Neg Hx    Pancreatic cancer Neg Hx    Rectal cancer Neg Hx     Past Surgical History:  Procedure Laterality Date   ABDOMINAL EXPLORATION SURGERY  1977   ABDOMINAL HYSTERECTOMY  1977   "left one of my ovaries"   BACK SURGERY     BIOPSY  05/27/2019   Procedure: BIOPSY;  Surgeon: Daneil Dolin, MD;  [facility];  Service: Endoscopy;;   COLONOSCOPY  2011   Dr. Ardis Hughs: Mild diverticulosis, descending diminutive colon polyp (not retrieved), next colonoscopy 10 years   CORONARY ANGIOPLASTY WITH STENT PLACEMENT  2011 X2   "regular stents didn't work; had to go back in in ~ 1 month and put in medicated stents"   DILATION AND CURETTAGE OF UTERUS     ESOPHAGOGASTRODUODENOSCOPY     ESOPHAGOGASTRODUODENOSCOPY (EGD) WITH PROPOFOL N/A 05/27/2019   normal esophagus, dilation, erosive gastropathy s/p biopsy, normal duodenum. Negative H.pylori.    LEFT HEART CATH AND CORONARY ANGIOGRAPHY N/A 03/26/2017   Procedure: LEFT HEART CATH AND CORONARY ANGIOGRAPHY;  Surgeon: Belva Crome, MD;  Location: [facility];  Service: Cardiovascular;  Laterality: N/A;   LUMBAR LAMINECTOMY/DECOMPRESSION MICRODISCECTOMY  08/05/2011   Procedure: LUMBAR LAMINECTOMY/DECOMPRESSION MICRODISCECTOMY;  Surgeon: Otilio Connors, MD;  Location: [facility];  Service: Neurosurgery;  Laterality: Right;  Right Lumbar four-five extraforaminal discectomy   MALONEY DILATION N/A 05/27/2019   Procedure: Venia Minks DILATION;  Surgeon: Daneil Dolin, MD;  [facility]: [facility];  Service: Endoscopy;  Laterality: N/A;  54   SHOULDER ARTHROSCOPY WITH ROTATOR CUFF REPAIR AND OPEN BICEPS TENODESIS Right 12/24/2021   Procedure: RIGHT SHOULDER ARTHROSCOPY WITH  ROTATOR CUFF REPAIR AND BICEPS TENODESIS;  Surgeon: Marybelle Killings, MD;  Location: [facility];  Service: Orthopedics;  Laterality: Right;   TONSILLECTOMY     as a child   UPPER GASTROINTESTINAL ENDOSCOPY      ROS: Review of Systems Negative except as stated above  PHYSICAL EXAM: BP (!) 165/79 (BP Location: Left Arm, Patient Position: Sitting, Cuff Size: Normal)   Pulse 63   Temp 97.6 F (36.4 C) (Oral)   Ht '5\' 2"'$  (1.575 m)   Wt 152 lb (68.9 kg)   SpO2 97%   BMI 27.80 kg/m   Physical Exam  General appearance -older Caucasian African-American female who appears not well but in NAD.  She has mild audible congestion. Mental status - normal mood, behavior, speech, dress, motor activity, and thought processes Mouth -oral mucosa is a bit dry. Neck - supple, no significant adenopathy Chest -mild diffuse wheezing throughout.  No wheezes or crackles. Heart - normal rate, regular rhythm, normal S1, S2, no murmurs, rubs, clicks or gallops Musculoskeletal -right shoulder: No tenderness on palpation of the joint lines.  She has mild to moderate discomfort with passive range of motion in all directions but especially with elevation.      Latest Ref Rng & Units 02/18/2022    1:51  PM 12/18/2021    3:40 PM 10/03/2021   12:30 PM  CMP  Glucose 70 - 99 mg/dL 105  112  104   BUN 8 - 23 mg/dL '15  12  10   '$ Creatinine 0.44 - 1.00 mg/dL 0.86  0.98  0.78   Sodium 135 - 145 mmol/L 139  139  139   Potassium 3.5 - 5.1 mmol/L 4.2  3.9  4.0   Chloride 98 - 111 mmol/L 106  105  105   CO2 22 - 32 mmol/L '25  24  28   '$ Calcium 8.9 - 10.3 mg/dL 9.7  9.2  9.8   Total Protein 6.5 - 8.1 g/dL 7.5   7.2   Total Bilirubin 0.3 - 1.2 mg/dL 0.1   0.4   Alkaline Phos 38 - 126 U/L 91   97   AST 15 - 41 U/L 19   11   ALT 0 - 44 U/L 15   11    Lipid Panel     Component Value Date/Time   CHOL 229 (H) 03/21/2022 1218   TRIG 99 03/21/2022 1218   HDL 76 03/21/2022 1218   CHOLHDL 3.0 03/21/2022 1218   CHOLHDL 3.8 04/16/2016  0934   VLDL 15 04/16/2016 0934   LDLCALC 136 (H) 03/21/2022 1218    CBC    Component Value Date/Time   WBC 5.8 02/18/2022 1351   WBC 7.1 11/28/2021 1645   RBC 5.05 02/18/2022 1351   HGB 15.5 (H) 02/18/2022 1351   HGB 13.0 09/14/2021 1058   HCT 46.7 (H) 02/18/2022 1351   HCT 41.5 09/14/2021 1058   PLT 515 (H) 02/18/2022 1351   PLT 541 (H) 09/14/2021 1058   MCV 92.5 02/18/2022 1351   MCV 87 09/14/2021 1058   MCH 30.7 02/18/2022 1351   MCHC 33.2 02/18/2022 1351   RDW 16.1 (H) 02/18/2022 1351   RDW 15.8 (H) 09/14/2021 1058   LYMPHSABS 1.8 02/18/2022 1351   LYMPHSABS 2.6 04/05/2019 0926   MONOABS 0.5 02/18/2022 1351   EOSABS 0.2 02/18/2022 1351   EOSABS 0.3 04/05/2019 0926   BASOSABS 0.0 02/18/2022 1351   BASOSABS 0.1 04/05/2019 0926    ASSESSMENT AND PLAN: 1. Viral respiratory illness We will screen for COVID, flu and RSV. Encouraged her to get flu shot and COVID booster when she gets over this acute illness. - COVID-19, Flu A+B and RSV  2. COPD exacerbation ([place]) Due to acute viral illness. She will continue Trelegy inhaler and nebulizer treatments as needed.  Will give 5-day course of prednisone. - predniSONE (DELTASONE) 20 MG tablet; Take 2 tablets (40 mg total) by mouth daily for 5 days.  Dispense: 10 tablet; Refill: 0 - doxycycline (VIBRA-TABS) 100 MG tablet; Take 1 tablet (100 mg total) by mouth 2 (two) times daily.  Dispense: 14 tablet; Refill: 0 - benzonatate (TESSALON) 200 MG capsule; Take 1 capsule (200 mg total) by mouth 2 (two) times daily as needed for cough.  Dispense: 20 capsule; Refill: 0  3. Tobacco dependence Strongly encouraged her to quit smoking  4. Acute pain of right shoulder Will get her back in with Dr. Lorin Mercy.     Patient was given the opportunity to ask questions.  Patient verbalized understanding of the plan and was able to repeat key elements of the plan.   This documentation was completed using Radio producer.  Any  transcriptional errors are unintentional.  No orders of the defined types were placed in this encounter.  Requested Prescriptions    No prescriptions requested or ordered in this encounter    No follow-ups on file.  Karle Plumber, MD, FACP

## 2022-08-03 LAB — COVID-19, FLU A+B AND RSV
Influenza A, NAA: NOT DETECTED
Influenza B, NAA: NOT DETECTED
RSV, NAA: NOT DETECTED
SARS-CoV-2, NAA: NOT DETECTED

## 2022-08-12 ENCOUNTER — Other Ambulatory Visit: Payer: Self-pay | Admitting: Internal Medicine

## 2022-08-12 ENCOUNTER — Ambulatory Visit: Payer: Medicare Other | Attending: Family Medicine

## 2022-08-12 ENCOUNTER — Other Ambulatory Visit: Payer: Self-pay

## 2022-08-12 DIAGNOSIS — I10 Essential (primary) hypertension: Secondary | ICD-10-CM

## 2022-08-12 DIAGNOSIS — Z23 Encounter for immunization: Secondary | ICD-10-CM

## 2022-08-12 NOTE — Progress Notes (Signed)
Flu vaccine administered in right deltoid per protocols.  Information sheet given. Patient denies and pain or discomfort at injection site. Tolerated injection well no reaction.

## 2022-08-13 ENCOUNTER — Other Ambulatory Visit: Payer: Self-pay

## 2022-08-13 MED ORDER — LOSARTAN POTASSIUM 50 MG PO TABS
75.0000 mg | ORAL_TABLET | Freq: Every day | ORAL | 0 refills | Status: DC
  Filled 2022-08-13: qty 135, 90d supply, fill #0

## 2022-08-14 ENCOUNTER — Other Ambulatory Visit: Payer: Self-pay

## 2022-08-14 ENCOUNTER — Encounter: Payer: Self-pay | Admitting: Hematology

## 2022-08-14 MED ORDER — TRELEGY ELLIPTA 100-62.5-25 MCG/ACT IN AEPB
1.0000 | INHALATION_SPRAY | Freq: Every day | RESPIRATORY_TRACT | 6 refills | Status: DC
  Filled 2022-08-14: qty 60, 30d supply, fill #0
  Filled 2022-09-16: qty 60, 30d supply, fill #1
  Filled 2022-10-14: qty 60, 30d supply, fill #2
  Filled 2022-11-13: qty 60, 30d supply, fill #3
  Filled 2022-12-13: qty 60, 30d supply, fill #4
  Filled 2023-01-14: qty 60, 30d supply, fill #5
  Filled 2023-02-18: qty 60, 30d supply, fill #6

## 2022-08-15 ENCOUNTER — Encounter: Payer: Self-pay | Admitting: Hematology

## 2022-08-15 ENCOUNTER — Other Ambulatory Visit: Payer: Self-pay

## 2022-08-19 ENCOUNTER — Other Ambulatory Visit: Payer: Self-pay

## 2022-08-19 DIAGNOSIS — D508 Other iron deficiency anemias: Secondary | ICD-10-CM

## 2022-08-20 ENCOUNTER — Ambulatory Visit (INDEPENDENT_AMBULATORY_CARE_PROVIDER_SITE_OTHER): Payer: Medicare Other | Admitting: Orthopaedic Surgery

## 2022-08-20 ENCOUNTER — Encounter: Payer: Self-pay | Admitting: Orthopaedic Surgery

## 2022-08-20 VITALS — BP 123/74 | HR 50 | Ht 61.5 in | Wt 150.0 lb

## 2022-08-20 DIAGNOSIS — M7501 Adhesive capsulitis of right shoulder: Secondary | ICD-10-CM | POA: Diagnosis not present

## 2022-08-20 NOTE — Progress Notes (Signed)
Office Visit Note   Patient: [name] [name]           Date of Birth: 1955/08/12           MRN: [id number] Visit Date: 08/20/2022              Requested by: Ladell Pier, MD [address] [place],  [place] [address] PCP: Ladell Pier, MD   Assessment & Plan: Visit Diagnoses:  1. Adhesive capsulitis of right shoulder          Mild recurrence post rotator cuff repair and biceps tenodesis.  Plan: Patient needs to get her bands back out start working on internal/external rotation against resistance with her elbow at her side which she was doing and then stopped.  She needs to use her pulley again and work on last 1520 degrees of passive flexion and abduction which she is lost in the last few months.  Discussed with her by exam the rotator cuff is intact.  She is not doing better and months she can return we will consider an injection but today we deferred this with her diagnosis of diabetes.  Follow-Up Instructions: No follow-ups on file.   Orders:  No orders of the defined types were placed in this encounter.  No orders of the defined types were placed in this encounter.     Procedures: No procedures performed   Clinical Data: No additional findings.   Subjective: Chief Complaint  Patient presents with   Right Shoulder - Pain    HPI 67 year old female with right shoulder pain 8 months post rotator cuff repair and biceps tenodesis.  States she bumped her shoulder against the wall couple times she feels like it is heavy feels somewhat numb and she notices she is having trouble lifting and overhead.  She had been using her pulleys and stretch her bands keeping her strength that.  She has used Aleve and ibuprofen and cream without relief.  She can still get her arm up but lacks 20 degrees full extension with some mild resistance suggestive of some early frozen shoulder.  She has loss on the motion that she had 3 to 4 months ago.  Sensation of her hand is intact  she denies associated neck pain.  Patient does have diabetes but states it is controlled.  Review of Systems all other systems updated unchanged.   Objective: Vital Signs: BP 123/74   Pulse (!) 50   Ht 5' 1.5" (1.562 m)   Wt 150 lb (68 kg)   BMI 27.88 kg/m   Physical Exam Constitutional:      Appearance: She is well-developed.  HENT:     Head: Normocephalic.     Right Ear: External ear normal.     Left Ear: External ear normal. There is no impacted cerumen.  Eyes:     Pupils: Pupils are equal, round, and reactive to light.  Neck:     Thyroid: No thyromegaly.     Trachea: No tracheal deviation.  Cardiovascular:     Rate and Rhythm: Normal rate.  Pulmonary:     Effort: Pulmonary effort is normal.  Abdominal:     Palpations: Abdomen is soft.  Musculoskeletal:     Cervical back: No rigidity.  Skin:    General: Skin is warm and dry.  Neurological:     Mental Status: She is alert and oriented to person, place, and time.  Psychiatric:        Behavior: Behavior  normal.     Ortho Exam she lacks 15 degrees full abduction passively.  Actively/lacks 20 degrees of flexion and abduction.  Good resistance with empty can test resistive testing of the supraspinatus no supraspinatus atrophy.  Shoulder incisions well-healed no deltoid atrophy.  Specialty Comments:  No specialty comments available.  Imaging: No results found.   PMFS History: Patient Active Problem List   Diagnosis Date Noted   Nontraumatic complete tear of right rotator cuff    Superior glenoid labrum lesion of left shoulder    Iron deficiency anemia 11/02/2021   Major depressive disorder, recurrent, in full remission with anxious distress ([facility]) 05/31/2021   Rotator cuff tendinitis, right 05/08/2021   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   Chronic neck pain 02/26/2021   GAD (generalized anxiety disorder) 01/08/2021   Acute cystitis without hematuria 11/07/2020   Influenza vaccine needed  06/15/2020   Abdominal pain, epigastric 04/28/2019   Esophageal dysphagia 04/28/2019   Bipolar I disorder ([facility]) 09/17/2018   Insomnia 10/28/2017   Prediabetes 05/28/2017   QT prolongation 03/25/2017   Encounter for screening mammogram for breast cancer 09/11/2015   Psoriasis 06/22/2015   COPD (chronic obstructive pulmonary disease) (HCC)GOLD E 05/18/2015   Anxiety and depression 07/10/2013   Essential hypertension, benign 06/07/2013   Dyslipidemia 06/07/2013   Asthma, chronic 06/07/2013   Emphysema lung ([place] [facility]) 06/07/2013   Chronic back pain 06/07/2013   CAD S/P percutaneous coronary angioplasty 06/07/2013   GERD (gastroesophageal reflux disease) 06/07/2013   Breast lump on left side at 3 o'clock position 10/13/2012   S/P abdominal hysterectomy and right salpingo-oophorectomy 08/29/2011   TOBACCO ABUSE 07/17/2009   Chronic rhinitis 07/17/2009   Lung nodule < 6cm on CT 07/17/2009   ALLERGY, FOOD 07/17/2009   Hypothyroidism 12/22/2008   Past Medical History:  Diagnosis Date   Allergy    seasonal   Anemia    Anxiety    takes Lexapro daily   Arthritis    "back, from neck down pass my bra area" (03/25/2017)   Asthma    Bartholin gland cyst 08/29/2011   Bruises easily    pt is on Effient   Chronic back pain    herniated nucleus pulposus   Chronic back pain    "neck to bra area; lower back" (03/25/2017)   Chronic kidney disease    recurrent UTI's this year 2022   COPD (chronic obstructive pulmonary disease) ([facility])    early stages   Coronary artery disease    Depression    takes Klonopin daily   Diabetes mellitus without complication ([facility])    Diverticulosis    Fibroadenoma of left breast    GERD (gastroesophageal reflux disease)    takes Nexium daily   H/O hiatal hernia    Heart attack ([facility]) 2011   Hemorrhoids    Hernia    Hyperlipidemia    takes Lipitor daily   Hypertension    takes Losartan daily and Labetalol bid   Hypothyroidism    takes Synthroid daily    Insomnia    hydroxyzine prn   Joint pain    Pneumonia    "couple times" (03/25/2017)   Pre-diabetes    "just found out 1 wk ago" (03/25/2017)   Psoriasis    elbows,knees,back   Shortness of breath    with exertion   Slowing of urinary stream    Stress incontinence     Family History  Problem Relation Age of Onset   Heart disease Mother  Hypertension Mother    Stroke Mother    Mental illness Mother    Heart disease Father    Hypertension Father    Diabetes Father    Colon polyps Brother    Breast cancer Maternal Aunt    Throat cancer Maternal Uncle    Thyroid disease Paternal Aunt    Heart disease Maternal Grandfather    Cancer Paternal Grandmother        [place]   Anesthesia problems Daughter    Hypotension Neg Hx    Malignant hyperthermia Neg Hx    Pseudochol deficiency Neg Hx    Colon cancer Neg Hx    Stomach cancer Neg Hx    Esophageal cancer Neg Hx    Pancreatic cancer Neg Hx    Rectal cancer Neg Hx     Past Surgical History:  Procedure Laterality Date   ABDOMINAL EXPLORATION SURGERY  1977   ABDOMINAL HYSTERECTOMY  1977   "left one of my ovaries"   BACK SURGERY     BIOPSY  05/27/2019   Procedure: BIOPSY;  Surgeon: Daneil Dolin, MD;  [facility];  Service: Endoscopy;;   COLONOSCOPY  2011   Dr. Ardis Hughs: Mild diverticulosis, descending diminutive colon polyp (not retrieved), next colonoscopy 10 years   CORONARY ANGIOPLASTY WITH STENT PLACEMENT  2011 X2   "regular stents didn't work; had to go back in in ~ 1 month and put in medicated stents"   DILATION AND CURETTAGE OF UTERUS     ESOPHAGOGASTRODUODENOSCOPY     ESOPHAGOGASTRODUODENOSCOPY (EGD) WITH PROPOFOL N/A 05/27/2019   normal esophagus, dilation, erosive gastropathy s/p biopsy, normal duodenum. Negative H.pylori.    LEFT HEART CATH AND CORONARY ANGIOGRAPHY N/A 03/26/2017   Procedure: LEFT HEART CATH AND CORONARY ANGIOGRAPHY;  Surgeon: Belva Crome, MD;  Location: [facility];   Service: Cardiovascular;  Laterality: N/A;   LUMBAR LAMINECTOMY/DECOMPRESSION MICRODISCECTOMY  08/05/2011   Procedure: LUMBAR LAMINECTOMY/DECOMPRESSION MICRODISCECTOMY;  Surgeon: Otilio Connors, MD;  Location: [facility];  Service: Neurosurgery;  Laterality: Right;  Right Lumbar four-five extraforaminal discectomy   MALONEY DILATION N/A 05/27/2019   Procedure: Venia Minks DILATION;  Surgeon: Daneil Dolin, MD;  Location: [facility];  Service: Endoscopy;  Laterality: N/A;  54   SHOULDER ARTHROSCOPY WITH ROTATOR CUFF REPAIR AND OPEN BICEPS TENODESIS Right 12/24/2021   Procedure: RIGHT SHOULDER ARTHROSCOPY WITH ROTATOR CUFF REPAIR AND BICEPS TENODESIS;  Surgeon: Marybelle Killings, MD;  Location: [facility];  Service: Orthopedics;  Laterality: Right;   TONSILLECTOMY     as a child   UPPER GASTROINTESTINAL ENDOSCOPY     Social History   Occupational History   Not on file  Tobacco Use   Smoking status: Every Day    Packs/day: 0.50    Years: 50.00    Total pack years: 25.00    Types: Cigarettes   Smokeless tobacco: Never  Vaping Use   Vaping Use: Some days   Substances: Nicotine  Substance and Sexual Activity   Alcohol use: Yes    Comment: once a week   Drug use: Yes    Types: Marijuana    Comment: history of cocaine use, been about a year, per patient (12/18/21)   Sexual activity: Not Currently    Birth control/protection: Surgical

## 2022-08-21 ENCOUNTER — Inpatient Hospital Stay (HOSPITAL_BASED_OUTPATIENT_CLINIC_OR_DEPARTMENT_OTHER): Payer: Medicare Other | Admitting: Hematology

## 2022-08-21 ENCOUNTER — Inpatient Hospital Stay: Payer: Medicare Other | Attending: Hematology

## 2022-08-21 ENCOUNTER — Other Ambulatory Visit: Payer: Self-pay

## 2022-08-21 VITALS — BP 154/67 | HR 67 | Temp 97.5°F | Resp 20 | Wt 155.6 lb

## 2022-08-21 DIAGNOSIS — D509 Iron deficiency anemia, unspecified: Secondary | ICD-10-CM | POA: Diagnosis not present

## 2022-08-21 DIAGNOSIS — N189 Chronic kidney disease, unspecified: Secondary | ICD-10-CM | POA: Insufficient documentation

## 2022-08-21 DIAGNOSIS — Z7984 Long term (current) use of oral hypoglycemic drugs: Secondary | ICD-10-CM | POA: Diagnosis not present

## 2022-08-21 DIAGNOSIS — E785 Hyperlipidemia, unspecified: Secondary | ICD-10-CM | POA: Diagnosis not present

## 2022-08-21 DIAGNOSIS — F419 Anxiety disorder, unspecified: Secondary | ICD-10-CM | POA: Diagnosis not present

## 2022-08-21 DIAGNOSIS — E611 Iron deficiency: Secondary | ICD-10-CM | POA: Insufficient documentation

## 2022-08-21 DIAGNOSIS — Z7902 Long term (current) use of antithrombotics/antiplatelets: Secondary | ICD-10-CM | POA: Diagnosis not present

## 2022-08-21 DIAGNOSIS — I129 Hypertensive chronic kidney disease with stage 1 through stage 4 chronic kidney disease, or unspecified chronic kidney disease: Secondary | ICD-10-CM | POA: Insufficient documentation

## 2022-08-21 DIAGNOSIS — J4489 Other specified chronic obstructive pulmonary disease: Secondary | ICD-10-CM | POA: Insufficient documentation

## 2022-08-21 DIAGNOSIS — K219 Gastro-esophageal reflux disease without esophagitis: Secondary | ICD-10-CM | POA: Insufficient documentation

## 2022-08-21 DIAGNOSIS — Z79890 Hormone replacement therapy (postmenopausal): Secondary | ICD-10-CM | POA: Diagnosis not present

## 2022-08-21 DIAGNOSIS — J449 Chronic obstructive pulmonary disease, unspecified: Secondary | ICD-10-CM | POA: Insufficient documentation

## 2022-08-21 DIAGNOSIS — E039 Hypothyroidism, unspecified: Secondary | ICD-10-CM | POA: Diagnosis not present

## 2022-08-21 DIAGNOSIS — F17210 Nicotine dependence, cigarettes, uncomplicated: Secondary | ICD-10-CM | POA: Diagnosis not present

## 2022-08-21 DIAGNOSIS — I2510 Atherosclerotic heart disease of native coronary artery without angina pectoris: Secondary | ICD-10-CM | POA: Insufficient documentation

## 2022-08-21 DIAGNOSIS — Z7982 Long term (current) use of aspirin: Secondary | ICD-10-CM | POA: Diagnosis not present

## 2022-08-21 DIAGNOSIS — E119 Type 2 diabetes mellitus without complications: Secondary | ICD-10-CM | POA: Insufficient documentation

## 2022-08-21 DIAGNOSIS — D508 Other iron deficiency anemias: Secondary | ICD-10-CM

## 2022-08-21 DIAGNOSIS — E1122 Type 2 diabetes mellitus with diabetic chronic kidney disease: Secondary | ICD-10-CM | POA: Diagnosis not present

## 2022-08-21 DIAGNOSIS — F32A Depression, unspecified: Secondary | ICD-10-CM | POA: Diagnosis not present

## 2022-08-21 DIAGNOSIS — D75839 Thrombocytosis, unspecified: Secondary | ICD-10-CM

## 2022-08-21 LAB — CMP (CANCER CENTER ONLY)
ALT: 13 U/L (ref 0–44)
AST: 13 U/L — ABNORMAL LOW (ref 15–41)
Albumin: 3.9 g/dL (ref 3.5–5.0)
Alkaline Phosphatase: 98 U/L (ref 38–126)
Anion gap: 7 (ref 5–15)
BUN: 14 mg/dL (ref 8–23)
CO2: 28 mmol/L (ref 22–32)
Calcium: 9.2 mg/dL (ref 8.9–10.3)
Chloride: 105 mmol/L (ref 98–111)
Creatinine: 0.88 mg/dL (ref 0.44–1.00)
GFR, Estimated: >60 mL/min (ref >60)
Glucose, Bld: 124 mg/dL — ABNORMAL HIGH (ref 70–99)
Potassium: 3.9 mmol/L (ref 3.5–5.1)
Sodium: 140 mmol/L (ref 135–145)
Total Bilirubin: 0.3 mg/dL (ref 0.3–1.2)
Total Protein: 6.6 g/dL (ref 6.5–8.1)

## 2022-08-21 LAB — CBC WITH DIFFERENTIAL (CANCER CENTER ONLY)
Abs Immature Granulocytes: 0.02 10*3/uL (ref 0.00–0.07)
Basophils Absolute: 0 10*3/uL (ref 0.0–0.1)
Basophils Relative: 0 %
Eosinophils Absolute: 0.1 10*3/uL (ref 0.0–0.5)
Eosinophils Relative: 1 %
HCT: 44.8 % (ref 36.0–46.0)
Hemoglobin: 14.9 g/dL (ref 12.0–15.0)
Immature Granulocytes: 0 %
Lymphocytes Relative: 19 %
Lymphs Abs: 1.8 10*3/uL (ref 0.7–4.0)
MCH: 32.3 pg (ref 26.0–34.0)
MCHC: 33.3 g/dL (ref 30.0–36.0)
MCV: 97 fL (ref 80.0–100.0)
Monocytes Absolute: 0.7 10*3/uL (ref 0.1–1.0)
Monocytes Relative: 7 %
Neutro Abs: 6.9 10*3/uL (ref 1.7–7.7)
Neutrophils Relative %: 73 %
Platelet Count: 418 10*3/uL — ABNORMAL HIGH (ref 150–400)
RBC: 4.62 MIL/uL (ref 3.87–5.11)
RDW: 14.3 % (ref 11.5–15.5)
WBC Count: 9.4 10*3/uL (ref 4.0–10.5)
nRBC: 0 % (ref 0.0–0.2)

## 2022-08-21 LAB — IRON AND IRON BINDING CAPACITY (CC-WL,HP ONLY)
Iron: 59 ug/dL (ref 28–170)
Saturation Ratios: 15 % (ref 10.4–31.8)
TIBC: 402 ug/dL (ref 250–450)
UIBC: 343 ug/dL (ref 148–442)

## 2022-08-21 LAB — FERRITIN: Ferritin: 12 ng/mL (ref 11–307)

## 2022-08-21 NOTE — Progress Notes (Signed)
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  Patient was last seen by me on 02/18/2022 and she was doing well overall.   Patient is accompanied by her daughter during this visit. She reports she has been doing fairly well since our last visit. She complains of increased fatigue since our last visit. Patient notes she still smokes cigarettes, around half pack to a pack a day.   Patient notes she occasionally has black stool, which have been evaluated by physician and they cannot find the reason for black stool. She reports black stools once a week.   She is not taking iron supplements. She is regularly taking acid reflux medication.  She denies fever, chills, night sweats, unexpected weight loss, back pain, abdominal pain, chest pain, or leg swelling. She does complain of left lower abdominal pain.  Patient tolerated last IV Iron infusion without any toxicities.    MEDICAL HISTORY:  Past Medical History:  Diagnosis Date   Allergy    seasonal   Anemia    Anxiety    takes Lexapro daily   Arthritis    "back, from neck down pass my bra area" (03/25/2017)   Asthma    Bartholin gland cyst 08/29/2011   Bruises easily    pt is on Effient   Chronic back pain    herniated nucleus pulposus   Chronic back pain    "neck to bra area; lower back" (03/25/2017)   Chronic kidney disease    recurrent UTI's this year 2022   COPD (chronic  obstructive pulmonary disease) (HCC)    early stages   Coronary artery disease    Depression    takes Klonopin daily   Diabetes mellitus without complication ([place])    Diverticulosis    Fibroadenoma of left breast    GERD (gastroesophageal reflux disease)    takes Nexium daily   H/O hiatal hernia    Heart attack ([place]) 2011   Hemorrhoids    Hernia    Hyperlipidemia    takes Lipitor daily   Hypertension    takes Losartan daily and Labetalol bid   Hypothyroidism    takes Synthroid daily   Insomnia    hydroxyzine prn   Joint pain    Pneumonia    "couple times" (03/25/2017)   Pre-diabetes    "just found out 1 wk ago" (03/25/2017)   Psoriasis    elbows,knees,back   Shortness of breath    with exertion   Slowing of urinary stream    Stress incontinence     SURGICAL HISTORY: Past Surgical History:  Procedure Laterality Date   [place]   "left one of my ovaries"   BACK SURGERY     BIOPSY  05/27/2019   Procedure: BIOPSY;  Surgeon: Daneil Dolin, MD;  [facility];  Service: Endoscopy;;   COLONOSCOPY  2011   Dr. Ardis Hughs: Mild diverticulosis, descending diminutive colon polyp (not retrieved),  next colonoscopy 10 years   CORONARY ANGIOPLASTY WITH STENT PLACEMENT  2011 X2   "regular stents didn't work; had to go back in in ~ 1 month and put in medicated stents"   [place]     ESOPHAGOGASTRODUODENOSCOPY     ESOPHAGOGASTRODUODENOSCOPY (EGD) WITH PROPOFOL N/A 05/27/2019   normal esophagus, dilation, erosive gastropathy s/p biopsy, normal duodenum. Negative H.pylori.    LEFT HEART CATH AND CORONARY ANGIOGRAPHY N/A 03/26/2017   Procedure: LEFT HEART CATH AND CORONARY ANGIOGRAPHY;  Surgeon: Belva Crome, MD;  Location: [facility];  Service: Cardiovascular;  Laterality: N/A;   LUMBAR LAMINECTOMY/DECOMPRESSION MICRODISCECTOMY  08/05/2011   Procedure: LUMBAR LAMINECTOMY/DECOMPRESSION  MICRODISCECTOMY;  Surgeon: Otilio Connors, MD;  Location: [facility];  Service: Neurosurgery;  Laterality: Right;  Right Lumbar four-five extraforaminal discectomy   MALONEY DILATION N/A 05/27/2019   Procedure: Venia Minks DILATION;  Surgeon: Daneil Dolin, MD;  [facility]: [facility];  Service: Endoscopy;  Laterality: N/A;  54   SHOULDER ARTHROSCOPY WITH ROTATOR CUFF REPAIR AND OPEN BICEPS TENODESIS Right 12/24/2021   Procedure: RIGHT SHOULDER ARTHROSCOPY WITH ROTATOR CUFF REPAIR AND BICEPS TENODESIS;  Surgeon: Marybelle Killings, MD;  Location: [facility];  Service: Orthopedics;  Laterality: Right;   TONSILLECTOMY     as a child   UPPER GASTROINTESTINAL ENDOSCOPY      SOCIAL HISTORY: Social History   Socioeconomic History   Marital status: Divorced    Spouse name: Not on file   Number of children: 1   Years of education: Not on file   Highest education level: 9th grade  Occupational History   Not on file  Tobacco Use   Smoking status: Every Day    Packs/day: 0.50    Years: 50.00    Total pack years: 25.00    Types: Cigarettes   Smokeless tobacco: Never  Vaping Use   Vaping Use: Some days   Substances: Nicotine  Substance and Sexual Activity   Alcohol use: Yes    Comment: once a week   Drug use: Yes    Types: Marijuana    Comment: history of cocaine use, been about a year, per patient (12/18/21)   Sexual activity: Not Currently    Birth control/protection: Surgical  Other Topics Concern   Not on file  Social History Narrative   Not on file   Social Determinants of Health   Financial Resource Strain: Low Risk  (06/09/2018)   Overall Financial Resource Strain (CARDIA)    Difficulty of Paying Living Expenses: Not hard at all  Food Insecurity: Food Insecurity Present (12/20/2021)   Hunger Vital Sign    Worried About [place] in the Last Year: Sometimes true    Ran Out of Food in the Last Year: Sometimes true  Transportation Needs: Unmet Transportation Needs (12/20/2021)    PRAPARE - Hydrologist (Medical): Yes    Lack of Transportation (Non-Medical): No  Physical Activity: Inactive (12/20/2021)   Exercise Vital Sign    Days of Exercise per Week: 0 days    Minutes of Exercise per Session: 0 min  Stress: Stress Concern Present (12/20/2021)   [place]    Feeling of Stress : To some extent  Social Connections: Moderately Isolated (12/20/2021)   Social Connection and Isolation Panel [NHANES]    Frequency of Communication with Friends and Family: More than three times a week  Frequency of Social Gatherings with Friends and Family: More than three times a week    Attends Religious Services: 1 to 4 times per year    Active Member of [employer] or Organizations: No    Attends Archivist Meetings: Never    Marital Status: Divorced  Human resources officer Violence: Not At Risk (12/20/2021)   Humiliation, Afraid, Rape, and Kick questionnaire    Fear of Current or Ex-Partner: No    Emotionally Abused: No    Physically Abused: No    Sexually Abused: No    FAMILY HISTORY: Family History  Problem Relation Age of Onset   Heart disease Mother    Hypertension Mother    Stroke Mother    Mental illness Mother    Heart disease Father    Hypertension Father    Diabetes Father    Colon polyps Brother    Breast cancer Maternal Aunt    Throat cancer Maternal Uncle    Thyroid disease Paternal Aunt    Heart disease Maternal Grandfather    Cancer Paternal Grandmother        mouth   Anesthesia problems Daughter    Hypotension Neg Hx    Malignant hyperthermia Neg Hx    Pseudochol deficiency Neg Hx    Colon cancer Neg Hx    Stomach cancer Neg Hx    Esophageal cancer Neg Hx    Pancreatic cancer Neg Hx    Rectal cancer Neg Hx     ALLERGIES:  is allergic to avocado, latex, and codeine.  MEDICATIONS:  Current Outpatient Medications  Medication Sig Dispense Refill    albuterol (VENTOLIN HFA) 108 (90 Base) MCG/ACT inhaler INHALE 2 PUFFS INTO THE LUNGS EVERY 6 (SIX) HOURS AS NEEDED FOR WHEEZING OR SHORTNESS OF BREATH. 18 g 0   albuterol (VENTOLIN HFA) 108 (90 Base) MCG/ACT inhaler INHALE 2 PUFFS INTO THE LUNGS EVERY 6 (SIX) HOURS AS NEEDED FOR WHEEZING OR SHORTNESS OF BREATH. 18 g 0   amLODipine (NORVASC) 5 MG tablet Take 1 tablet (5 mg total) by mouth daily. 90 tablet 0   aspirin EC 81 MG tablet Take 81 mg by mouth daily.     atorvastatin (LIPITOR) 80 MG tablet Take 1 tablet (80 mg total) by mouth daily. please schedule appointment for more refills 90 tablet 3   benzonatate (TESSALON) 200 MG capsule Take 1 capsule (200 mg total) by mouth 2 (two) times daily as needed for cough. 20 capsule 0   Blood Glucose Monitoring Suppl (TRUE METRIX METER) DEVI 1 kit by Does not apply route 4 (four) times daily. 1 Device 0   busPIRone (BUSPAR) 15 MG tablet TAKE 2 TABLETS IN THE MORNING, 1 TAB AT NOON AND 1 TAB IN THE EVENING 120 tablet 3   Chlorpheniramine-DM (COUGH & COLD HBP PO) Take 1 tablet by mouth daily as needed (congestion).     clonazePAM (KLONOPIN) 0.5 MG tablet Take 1 tablet (0.5 mg total) by mouth 2 (two) times daily as needed for anxiety. 60 tablet 1   doxepin (SINEQUAN) 75 MG capsule Take 1 capsule (75 mg total) by mouth at bedtime. 30 capsule 3   doxycycline (VIBRA-TABS) 100 MG tablet Take 1 tablet (100 mg total) by mouth 2 (two) times daily. 14 tablet 0   escitalopram (LEXAPRO) 10 MG tablet TAKE 3 TABLETS (30 MG TOTAL) BY MOUTH DAILY. 90 tablet 3   ferrous sulfate 325 (65 FE) MG tablet Take 1 tablet (325 mg total) by mouth daily with breakfast.  100 tablet 1   fluticasone (FLONASE) 50 MCG/ACT nasal spray Place 2 sprays into both nostrils daily. 16 g 0   Fluticasone-Umeclidin-Vilant (TRELEGY ELLIPTA) 100-62.5-25 MCG/ACT AEPB Inhale 1 puff into the lungs daily. 60 each 6   furosemide (LASIX) 20 MG tablet Take 1 tablet (20 mg total) by mouth daily. 30 tablet 2    gabapentin (NEURONTIN) 600 MG tablet TAKE 1 TABLET BY MOUTH 4 TIMES DAILY 360 tablet 1   glucose blood (TRUE METRIX BLOOD GLUCOSE TEST) test strip Use as instructed 100 each 12   ipratropium-albuterol (DUONEB) 0.5-2.5 (3) MG/3ML SOLN TAKE 3 MLS VIA NEBULIZATION EVERY 6 HOURS 360 mL 1   levothyroxine (SYNTHROID) 112 MCG tablet Take 1 tablet (112 mcg total) by mouth daily. 30 tablet 0   loperamide (IMODIUM A-D) 2 MG tablet Take 4 mg by mouth 4 (four) times daily as needed for diarrhea or loose stools.     losartan (COZAAR) 50 MG tablet Take 1.5 tablets (75 mg total) by mouth daily. 135 tablet 0   metFORMIN (GLUCOPHAGE) 500 MG tablet TAKE 1 TABLET (500 MG TOTAL) BY MOUTH 2 (TWO) TIMES DAILY WITH A MEAL. 60 tablet 1   methocarbamol (ROBAXIN) 500 MG tablet Take 1 tablet (500 mg total) by mouth 2 (two) times daily as needed for muscle spasms. (Must have office visit for refills.) 60 tablet 0   mirtazapine (REMERON) 30 MG tablet TAKE 1 TABLET (30 MG TOTAL) BY MOUTH AT BEDTIME. 30 tablet 3   montelukast (SINGULAIR) 10 MG tablet Take 1 tablet (10 mg total) by mouth at bedtime. 90 tablet 0   nebivolol (BYSTOLIC) 10 MG tablet TAKE 1 TABLET (10 MG TOTAL) BY MOUTH DAILY. 90 tablet 1   nitroGLYCERIN (NITROSTAT) 0.4 MG SL tablet Place 1 tablet (0.4 mg total) under the tongue every 5 (five) minutes as needed for chest pain. 25 tablet 3   omeprazole (PRILOSEC) 40 MG capsule Take 1 capsule (40 mg total) by mouth 2 (two) times daily before a meal. 60 capsule 5   predniSONE (DELTASONE) 20 MG tablet Take 2 tablets (40 mg total) by mouth daily for 5 days. 10 tablet 0   sodium chloride (OCEAN) 0.65 % SOLN nasal spray Place 1 spray into both nostrils as needed for congestion.     Spacer/Aero-Holding Chambers (AEROCHAMBER MV) inhaler Use as instructed 1 each 1   ticagrelor (BRILINTA) 60 MG TABS tablet TAKE 1 TABLET (60 MG TOTAL) BY MOUTH 2 (TWO) TIMES DAILY. 60 tablet 5   traMADol (ULTRAM) 50 MG tablet Take 1 tablet (50 mg  total) by mouth every 6 (six) hours as needed. 40 tablet 0   TRUEPLUS LANCETS 28G MISC 28 g by Does not apply route QID. 120 each 2   No current facility-administered medications for this visit.    REVIEW OF SYSTEMS:   10 Point review of Systems was done is negative except as noted above.  PHYSICAL EXAMINATION: ECOG PERFORMANCE STATUS: 2 - Symptomatic, <50% confined to bed  . Vitals:   08/21/22 1320  BP: (!) 154/67  Pulse: 67  Resp: 20  Temp: (!) 97.5 F (36.4 C)  SpO2: 99%   Filed Weights   08/21/22 1320  Weight: 155 lb 9.6 oz (70.6 kg)   .Body mass index is 28.92 kg/m.  NAD GENERAL:alert, in no acute distress and comfortable SKIN: no acute rashes, no significant lesions EYES: conjunctiva are pink and non-injected, sclera anicteric OROPHARYNX: MMM, no exudates, no oropharyngeal erythema or ulceration NECK: supple, no  JVD LYMPH:  no palpable lymphadenopathy in the cervical, axillary or inguinal regions LUNGS: clear to auscultation b/l with normal respiratory effort HEART: regular rate & rhythm ABDOMEN:  normoactive bowel sounds , non tender, not distended. Extremity: no pedal edema PSYCH: alert & oriented x 3 with fluent speech NEURO: no focal motor/sensory deficits  LABORATORY DATA:  I have reviewed the data as listed  .    Latest Ref Rng & Units 08/21/2022    1:15 PM 02/18/2022    1:51 PM 11/28/2021    4:45 PM  CBC  WBC 4.0 - 10.5 K/uL 9.4  5.8  7.1   Hemoglobin 12.0 - 15.0 g/dL 14.9  15.5  12.5   Hematocrit 36.0 - 46.0 % 44.8  46.7  38.3   Platelets 150 - 400 K/uL 418  515  429.0     .    Latest Ref Rng & Units 08/21/2022    1:15 PM 02/18/2022    1:51 PM 12/18/2021    3:40 PM  CMP  Glucose 70 - 99 mg/dL 124  105  112   BUN 8 - 23 mg/dL 14  15  12   $ Creatinine 0.44 - 1.00 mg/dL 0.88  0.86  0.98   Sodium 135 - 145 mmol/L 140  139  139   Potassium 3.5 - 5.1 mmol/L 3.9  4.2  3.9   Chloride 98 - 111 mmol/L 105  106  105   CO2 22 - 32 mmol/L 28  25  24    $ Calcium 8.9 - 10.3 mg/dL 9.2  9.7  9.2   Total Protein 6.5 - 8.1 g/dL 6.6  7.5    Total Bilirubin 0.3 - 1.2 mg/dL 0.3  0.1    Alkaline Phos 38 - 126 U/L 98  91    AST 15 - 41 U/L 13  19    ALT 0 - 44 U/L 13  15     . 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 No results found.  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 Reactive from iron deficiency. 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 CBC shows elevated Platelets of 515k now improved to 418k.  CMP is stable.  Patient is mildly iron deficient. With iron saturation of 15% and ferritin of 12 -recommended to follow-up with her gastroenterologist due to occasional black stool.  -Recommended to contact cardiologist regarding black stool because she is on blood thinner.  -Answered all of patient's questions.  --We discussed that her continued smoking could certainly be a reason for inflammatory increase in her platelets and patient -recommended to pursue complete smoking cessation. -No evidence of progression of the patient's suspected chip at this time. -Patient will receive IV Iron, Venofer, 300 mg weekly  x 3  doses  FOLLOW-UP: IV venofer 327m weekly x 3 doses RTC with Dr KIrene Limbowith labs in 3 months  The total time spent in the appointment was 20 minutes* .  All of the patient's questions were answered with apparent satisfaction. The patient knows to call the clinic with any problems, questions or concerns.   GSullivan LoneMD MS AAHIVMS SJennings Senior Care HospitalCTrinity MuscatineHematology/Oncology Physician [facility]ingham [facility] .*Total Encounter Time as defined by the Centers for Medicare and Medicaid Services includes, in addition to the face-to-face time of a patient visit (documented in the note above) non-face-to-face time: obtaining and reviewing outside history, ordering and reviewing medications, tests or procedures, care coordination (communications with other health care professionals or caregivers) and documentation in the medical record.   I, PCleda Mccreedy am acting as a sEducation administratorfor GSullivan Lone MD. .I have reviewed the above documentation for accuracy and completeness, and I agree with the above. .Brunetta GeneraMD

## 2022-08-23 ENCOUNTER — Other Ambulatory Visit: Payer: Self-pay

## 2022-08-23 ENCOUNTER — Inpatient Hospital Stay: Payer: Medicare Other

## 2022-08-23 VITALS — BP 132/69 | HR 55 | Temp 98.1°F | Resp 20

## 2022-08-23 DIAGNOSIS — D508 Other iron deficiency anemias: Secondary | ICD-10-CM

## 2022-08-23 DIAGNOSIS — D75839 Thrombocytosis, unspecified: Secondary | ICD-10-CM | POA: Diagnosis not present

## 2022-08-23 DIAGNOSIS — F17210 Nicotine dependence, cigarettes, uncomplicated: Secondary | ICD-10-CM | POA: Diagnosis not present

## 2022-08-23 DIAGNOSIS — E611 Iron deficiency: Secondary | ICD-10-CM | POA: Diagnosis not present

## 2022-08-23 DIAGNOSIS — J449 Chronic obstructive pulmonary disease, unspecified: Secondary | ICD-10-CM | POA: Diagnosis not present

## 2022-08-23 DIAGNOSIS — N189 Chronic kidney disease, unspecified: Secondary | ICD-10-CM | POA: Diagnosis not present

## 2022-08-23 DIAGNOSIS — K219 Gastro-esophageal reflux disease without esophagitis: Secondary | ICD-10-CM | POA: Diagnosis not present

## 2022-08-23 MED ORDER — ACETAMINOPHEN 325 MG PO TABS
650.0000 mg | ORAL_TABLET | Freq: Once | ORAL | Status: AC
  Administered 2022-08-23: 650 mg via ORAL
  Filled 2022-08-23: qty 2

## 2022-08-23 MED ORDER — SODIUM CHLORIDE 0.9 % IV SOLN: Freq: Once | INTRAVENOUS | Status: AC

## 2022-08-23 MED ORDER — LORATADINE 10 MG PO TABS
10.0000 mg | ORAL_TABLET | Freq: Once | ORAL | Status: AC
  Administered 2022-08-23: 10 mg via ORAL
  Filled 2022-08-23: qty 1

## 2022-08-23 MED ORDER — SODIUM CHLORIDE 0.9 % IV SOLN
300.0000 mg | Freq: Once | INTRAVENOUS | Status: AC
  Administered 2022-08-23: 300 mg via INTRAVENOUS
  Filled 2022-08-23: qty 300

## 2022-08-23 NOTE — Progress Notes (Signed)
Pt declined 30 minute post venofer observation. Pt verbalized understanding of side effects. VSS at discharge, tolerated tx well without incident. Pt ambulated to lobby w/o complaints.

## 2022-08-23 NOTE — Patient Instructions (Signed)

## 2022-08-27 ENCOUNTER — Encounter: Payer: Self-pay | Admitting: Hematology

## 2022-08-30 ENCOUNTER — Other Ambulatory Visit: Payer: Self-pay | Admitting: Hematology

## 2022-08-30 MED FILL — Iron Sucrose Inj 20 MG/ML (Fe Equiv): INTRAVENOUS | Qty: 15 | Status: AC

## 2022-08-31 ENCOUNTER — Inpatient Hospital Stay: Payer: Medicare Other

## 2022-08-31 VITALS — BP 142/63 | HR 59 | Temp 98.3°F | Resp 15

## 2022-08-31 DIAGNOSIS — D75839 Thrombocytosis, unspecified: Secondary | ICD-10-CM | POA: Diagnosis not present

## 2022-08-31 DIAGNOSIS — E611 Iron deficiency: Secondary | ICD-10-CM | POA: Diagnosis not present

## 2022-08-31 DIAGNOSIS — N189 Chronic kidney disease, unspecified: Secondary | ICD-10-CM | POA: Diagnosis not present

## 2022-08-31 DIAGNOSIS — K219 Gastro-esophageal reflux disease without esophagitis: Secondary | ICD-10-CM | POA: Diagnosis not present

## 2022-08-31 DIAGNOSIS — J449 Chronic obstructive pulmonary disease, unspecified: Secondary | ICD-10-CM | POA: Diagnosis not present

## 2022-08-31 DIAGNOSIS — F17210 Nicotine dependence, cigarettes, uncomplicated: Secondary | ICD-10-CM | POA: Diagnosis not present

## 2022-08-31 DIAGNOSIS — D508 Other iron deficiency anemias: Secondary | ICD-10-CM

## 2022-08-31 MED ORDER — LORATADINE 10 MG PO TABS
10.0000 mg | ORAL_TABLET | Freq: Once | ORAL | Status: AC
  Administered 2022-08-31: 10 mg via ORAL
  Filled 2022-08-31: qty 1

## 2022-08-31 MED ORDER — SODIUM CHLORIDE 0.9 % IV SOLN
300.0000 mg | Freq: Once | INTRAVENOUS | Status: AC
  Administered 2022-08-31: 300 mg via INTRAVENOUS
  Filled 2022-08-31: qty 300

## 2022-08-31 MED ORDER — SODIUM CHLORIDE 0.9 % IV SOLN: Freq: Once | INTRAVENOUS | Status: AC

## 2022-08-31 MED ORDER — ACETAMINOPHEN 325 MG PO TABS
650.0000 mg | ORAL_TABLET | Freq: Once | ORAL | Status: AC
  Administered 2022-08-31: 650 mg via ORAL
  Filled 2022-08-31: qty 2

## 2022-08-31 NOTE — Progress Notes (Signed)
Patient declined 30 minute post infusion observation.  Patient tolerated treatment without incident.  VSS at discharge.  Patient ambulated independently to lobby.  Angie Fava, RN

## 2022-08-31 NOTE — Patient Instructions (Signed)

## 2022-09-04 ENCOUNTER — Encounter (HOSPITAL_COMMUNITY): Payer: Self-pay | Admitting: Emergency Medicine

## 2022-09-04 ENCOUNTER — Ambulatory Visit (HOSPITAL_COMMUNITY)
Admission: EM | Admit: 2022-09-04 | Discharge: 2022-09-04 | Disposition: A | Discharge status: Home / Self Care | Payer: Medicare Other | Attending: Internal Medicine | Admitting: Internal Medicine

## 2022-09-04 ENCOUNTER — Other Ambulatory Visit: Payer: Self-pay

## 2022-09-04 DIAGNOSIS — Z87890 Personal history of sex reassignment: Secondary | ICD-10-CM | POA: Diagnosis not present

## 2022-09-04 DIAGNOSIS — J441 Chronic obstructive pulmonary disease with (acute) exacerbation: Secondary | ICD-10-CM | POA: Diagnosis not present

## 2022-09-04 DIAGNOSIS — F17210 Nicotine dependence, cigarettes, uncomplicated: Secondary | ICD-10-CM | POA: Diagnosis not present

## 2022-09-04 DIAGNOSIS — Z87898 Personal history of other specified conditions: Secondary | ICD-10-CM

## 2022-09-04 MED ORDER — IPRATROPIUM-ALBUTEROL 0.5-2.5 (3) MG/3ML IN SOLN
3.0000 mL | Freq: Once | RESPIRATORY_TRACT | Status: AC
  Administered 2022-09-04: 3 mL via RESPIRATORY_TRACT

## 2022-09-04 MED ORDER — METHYLPREDNISOLONE SODIUM SUCC 125 MG IJ SOLR
INTRAMUSCULAR | Status: AC
  Filled 2022-09-04: qty 2

## 2022-09-04 MED ORDER — PREDNISONE 20 MG PO TABS: 40.0000 mg | ORAL_TABLET | Freq: Every day | ORAL | 0 refills | Status: DC

## 2022-09-04 MED ORDER — DOXYCYCLINE HYCLATE 100 MG PO CAPS: 100.0000 mg | ORAL_CAPSULE | Freq: Two times a day (BID) | ORAL | 0 refills | Status: AC

## 2022-09-04 MED ORDER — BENZONATATE 200 MG PO CAPS: 200.0000 mg | ORAL_CAPSULE | Freq: Two times a day (BID) | ORAL | 0 refills | Status: DC | PRN

## 2022-09-04 MED ORDER — IPRATROPIUM-ALBUTEROL 0.5-2.5 (3) MG/3ML IN SOLN
RESPIRATORY_TRACT | Status: AC
  Filled 2022-09-04: qty 3

## 2022-09-04 MED ORDER — METHYLPREDNISOLONE SODIUM SUCC 125 MG IJ SOLR
80.0000 mg | Freq: Once | INTRAMUSCULAR | Status: AC
  Administered 2022-09-04: 80 mg via INTRAMUSCULAR

## 2022-09-04 NOTE — ED Provider Notes (Signed)
[place]    CSN: [id number] Arrival date & time: 09/04/22  [id number]      History   Chief Complaint Chief Complaint  Patient presents with   Cough    HPI [name] [name] is a 67 y.o. female.   Patient presents to urgent care for evaluation of cough, congestion, shortness of breath, and generalized fatigue for the last week.  Patient has a history of COPD as well as asthma.  She is a current everyday cigarette smoker and smokes 1/2 pack/day.  Denies recent antibiotic or steroid use.  She also has a history of high blood pressure, MI, anemia (receives iron transfusions), diabetes, and arthritis.  She is currently short of breath, however speaking in full sentences without difficulty.  She has been using her at home nebulizer machine frequently to relieve shortness of breath and states that this helps temporarily but the shortness of breath and wheeze return.  Cough is nonproductive and worse at nighttime.  Denies orthopnea and leg swelling.  She has not had any fever, chills, body aches, headache, chest pain, or recent known sick contacts with similar symptoms.  Last DuoNeb was last night.     Cough   Past Medical History:  Diagnosis Date   Allergy    seasonal   Anemia    Anxiety    takes Lexapro daily   Arthritis    "back, from neck down pass my bra area" (03/25/2017)   Asthma    Bartholin gland cyst 08/29/2011   Bruises easily    pt is on Effient   Chronic back pain    herniated nucleus pulposus   Chronic back pain    "neck to bra area; lower back" (03/25/2017)   Chronic kidney disease    recurrent UTI's this year 2022   COPD (chronic obstructive pulmonary disease) (HCC)    early stages   Coronary artery disease    Depression    takes Klonopin daily   Diabetes mellitus without complication ([place])    Diverticulosis    Fibroadenoma of left breast    GERD (gastroesophageal reflux disease)    takes Nexium daily   H/O hiatal hernia    Heart attack ([place]) 2011    Hemorrhoids    Hernia    Hyperlipidemia    takes Lipitor daily   Hypertension    takes Losartan daily and Labetalol bid   Hypothyroidism    takes Synthroid daily   Insomnia    hydroxyzine prn   Joint pain    Pneumonia    "couple times" (03/25/2017)   Pre-diabetes    "just found out 1 wk ago" (03/25/2017)   Psoriasis    elbows,knees,back   Shortness of breath    with exertion   Slowing of urinary stream    Stress incontinence     Patient Active Problem List   Diagnosis Date Noted   Nontraumatic complete tear of right rotator cuff    Superior glenoid labrum lesion of left shoulder    Iron deficiency anemia 11/02/2021   Major depressive disorder, recurrent, in full remission with anxious distress ([place]) 05/31/2021   Rotator cuff tendinitis, right 05/08/2021   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   Chronic neck pain 02/26/2021   GAD (generalized anxiety disorder) 01/08/2021   Acute cystitis without hematuria 11/07/2020   Influenza vaccine needed 06/15/2020   Abdominal pain, epigastric 04/28/2019   Esophageal dysphagia 04/28/2019   Bipolar I disorder ([place]) 09/17/2018   Insomnia 10/28/2017  Prediabetes 05/28/2017   QT prolongation 03/25/2017   Encounter for screening mammogram for breast cancer 09/11/2015   Psoriasis 06/22/2015   COPD (chronic obstructive pulmonary disease) (HCC)GOLD E 05/18/2015   Anxiety and depression 07/10/2013   Essential hypertension, benign 06/07/2013   Dyslipidemia 06/07/2013   Asthma, chronic 06/07/2013   Emphysema lung ([place]) 06/07/2013   Chronic back pain 06/07/2013   CAD S/P percutaneous coronary angioplasty 06/07/2013   GERD (gastroesophageal reflux disease) 06/07/2013   Breast lump on left side at 3 o'clock position 10/13/2012   S/P abdominal hysterectomy and right salpingo-oophorectomy 08/29/2011   TOBACCO ABUSE 07/17/2009   Chronic rhinitis 07/17/2009   Lung nodule < 6cm on CT 07/17/2009   ALLERGY, FOOD 07/17/2009    Hypothyroidism 12/22/2008    Past Surgical History:  Procedure Laterality Date   [place]   "left one of my ovaries"   BACK SURGERY     BIOPSY  05/27/2019   Procedure: BIOPSY;  Surgeon: Daneil Dolin, MD;  [facility];  Service: Endoscopy;;   COLONOSCOPY  2011   Dr. Ardis Hughs: Mild diverticulosis, descending diminutive colon polyp (not retrieved), next colonoscopy 10 years   CORONARY ANGIOPLASTY WITH STENT PLACEMENT  2011 X2   "regular stents didn't work; had to go back in in ~ 1 month and put in medicated stents"   DILATION AND CURETTAGE OF UTERUS     ESOPHAGOGASTRODUODENOSCOPY     ESOPHAGOGASTRODUODENOSCOPY (EGD) WITH PROPOFOL N/A 05/27/2019   normal esophagus, dilation, erosive gastropathy s/p biopsy, normal duodenum. Negative H.pylori.    LEFT HEART CATH AND CORONARY ANGIOGRAPHY N/A 03/26/2017   Procedure: LEFT HEART CATH AND CORONARY ANGIOGRAPHY;  Surgeon: Belva Crome, MD;  Location: [facility];  Service: Cardiovascular;  Laterality: N/A;   LUMBAR LAMINECTOMY/DECOMPRESSION MICRODISCECTOMY  08/05/2011   Procedure: LUMBAR LAMINECTOMY/DECOMPRESSION MICRODISCECTOMY;  Surgeon: Otilio Connors, MD;  Location: [facility];  Service: Neurosurgery;  Laterality: Right;  Right Lumbar four-five extraforaminal discectomy   MALONEY DILATION N/A 05/27/2019   Procedure: Venia Minks DILATION;  Surgeon: Daneil Dolin, MD;  [facility];  Service: Endoscopy;  Laterality: N/A;  54   SHOULDER ARTHROSCOPY WITH ROTATOR CUFF REPAIR AND OPEN BICEPS TENODESIS Right 12/24/2021   Procedure: RIGHT SHOULDER ARTHROSCOPY WITH ROTATOR CUFF REPAIR AND BICEPS TENODESIS;  Surgeon: Marybelle Killings, MD;  Location: [facility];  Service: Orthopedics;  Laterality: Right;   TONSILLECTOMY     as a child   UPPER GASTROINTESTINAL ENDOSCOPY      OB History     Gravida  1   Para  1   Term  1   Preterm  0   AB  0   Living  1       SAB  0   IAB  0   Ectopic  0   Multiple  0   Live Births               Home Medications    Prior to Admission medications   Medication Sig Start Date End Date Taking? Authorizing Provider  doxycycline (VIBRAMYCIN) 100 MG capsule Take 1 capsule (100 mg total) by mouth 2 (two) times daily for 5 days. 09/04/22 09/09/22 Yes Talbot Grumbling, FNP  predniSONE (DELTASONE) 20 MG tablet Take 2 tablets (40 mg total) by mouth daily for 5 days. 09/04/22 09/09/22 Yes Talbot Grumbling, FNP  albuterol (VENTOLIN HFA) 108 (90 Base) MCG/ACT inhaler INHALE  2 PUFFS INTO THE LUNGS EVERY 6 (SIX) HOURS AS NEEDED FOR WHEEZING OR SHORTNESS OF BREATH. 07/01/22   Ladell Pier, MD  albuterol (VENTOLIN HFA) 108 (90 Base) MCG/ACT inhaler INHALE 2 PUFFS INTO THE LUNGS EVERY 6 (SIX) HOURS AS NEEDED FOR WHEEZING OR SHORTNESS OF BREATH. 07/30/22   Ladell Pier, MD  amLODipine (NORVASC) 5 MG tablet Take 1 tablet (5 mg total) by mouth daily. 09/05/22   Ladell Pier, MD  aspirin EC 81 MG tablet Take 81 mg by mouth daily. Patient not taking: Reported on 09/04/2022    [provider]  atorvastatin (LIPITOR) 80 MG tablet Take 1 tablet (80 mg total) by mouth daily. please schedule appointment for more refills 03/22/22   Troy Sine, MD  benzonatate (TESSALON) 200 MG capsule Take 1 capsule (200 mg total) by mouth 2 (two) times daily as needed for cough. 09/04/22   Talbot Grumbling, FNP  Blood Glucose Monitoring Suppl (TRUE METRIX METER) DEVI 1 kit by Does not apply route 4 (four) times daily. 04/04/17   Brayton Caves, PA-C  busPIRone (BUSPAR) 15 MG tablet TAKE 2 TABLETS IN THE MORNING, 1 TAB AT NOON AND 1 TAB IN THE EVENING 02/28/22 02/28/23  Nwoko, Isidoro Donning E, PA  Chlorpheniramine-DM (COUGH & COLD HBP PO) Take 1 tablet by mouth daily as needed (congestion).    [provider]  clonazePAM (KLONOPIN) 0.5 MG tablet Take 1 tablet (0.5 mg total) by mouth 2 (two) times daily as needed for  anxiety. 03/15/22   Nwoko, Terese Door, PA  doxepin (SINEQUAN) 75 MG capsule Take 1 capsule (75 mg total) by mouth at bedtime. 02/28/22 02/28/23  Nwoko, Terese Door, PA  escitalopram (LEXAPRO) 10 MG tablet TAKE 3 TABLETS (30 MG TOTAL) BY MOUTH DAILY. 02/28/22 02/28/23  Malachy Mood, PA  ferrous sulfate 325 (65 FE) MG tablet Take 1 tablet (325 mg total) by mouth daily with breakfast. 11/26/21   Ladell Pier, MD  fluticasone [facility]) 50 MCG/ACT nasal spray Place 2 sprays into both nostrils daily. Patient not taking: Reported on 09/04/2022 04/26/22   Melynda Ripple, MD  Fluticasone-Umeclidin-Vilant (TRELEGY ELLIPTA) 100-62.5-25 MCG/ACT AEPB Inhale 1 puff into the lungs daily. 08/14/22   Ladell Pier, MD  furosemide (LASIX) 20 MG tablet Take 1 tablet (20 mg total) by mouth daily. 11/27/21   Ladell Pier, MD  gabapentin (NEURONTIN) 600 MG tablet TAKE 1 TABLET BY MOUTH 4 TIMES DAILY 11/21/21   Ladell Pier, MD  glucose blood (TRUE METRIX BLOOD GLUCOSE TEST) test strip Use as instructed 01/21/18   Ladell Pier, MD  ipratropium-albuterol (DUONEB) 0.5-2.5 (3) MG/3ML SOLN TAKE 3 MLS VIA NEBULIZATION EVERY 6 HOURS 11/18/19   Elsie Stain, MD  levothyroxine (SYNTHROID) 112 MCG tablet Take 1 tablet (112 mcg total) by mouth daily. 09/05/22   Ladell Pier, MD  loperamide (IMODIUM A-D) 2 MG tablet Take 4 mg by mouth 4 (four) times daily as needed for diarrhea or loose stools.    [provider]  losartan (COZAAR) 50 MG tablet Take 1.5 tablets (75 mg total) by mouth daily. 08/13/22   Ladell Pier, MD  metFORMIN (GLUCOPHAGE) 500 MG tablet TAKE 1 TABLET (500 MG TOTAL) BY MOUTH 2 (TWO) TIMES DAILY WITH A MEAL. 05/23/22   Ladell Pier, MD  methocarbamol (ROBAXIN) 500 MG tablet Take 1 tablet (500 mg total) by mouth 2 (two) times daily as needed for muscle spasms. (Must have office visit  for refills.) 06/14/22   Ladell Pier, MD  mirtazapine (REMERON) 30 MG tablet TAKE  1 TABLET (30 MG TOTAL) BY MOUTH AT BEDTIME. 02/28/22 02/28/23  Nwoko, Isidoro Donning E, PA  montelukast (SINGULAIR) 10 MG tablet Take 1 tablet (10 mg total) by mouth at bedtime. 09/05/22   Ladell Pier, MD  nebivolol (BYSTOLIC) 10 MG tablet Take 1 tablet (10 mg total) by mouth daily. 09/05/22   Ladell Pier, MD  nitroGLYCERIN (NITROSTAT) 0.4 MG SL tablet Place 1 tablet (0.4 mg total) under the tongue every 5 (five) minutes as needed for chest pain. 10/17/21 01/15/22  [name], NP  omeprazole (PRILOSEC) 40 MG capsule Take 1 capsule (40 mg total) by mouth 2 (two) times daily before a meal. 07/03/22   Lemmon, Lavone Nian, PA  sodium chloride (OCEAN) 0.65 % SOLN nasal spray Place 1 spray into both nostrils as needed for congestion.    [provider]  Spacer/Aero-Holding Chambers (AEROCHAMBER MV) inhaler Use as instructed 04/26/22   Melynda Ripple, MD  ticagrelor (BRILINTA) 60 MG TABS tablet TAKE 1 TABLET (60 MG TOTAL) BY MOUTH 2 (TWO) TIMES DAILY. 09/05/22   Ladell Pier, MD  traMADol (ULTRAM) 50 MG tablet Take 1 tablet (50 mg total) by mouth every 6 (six) hours as needed. Patient not taking: Reported on 09/04/2022 01/29/22   Marybelle Killings, MD  TRUEPLUS LANCETS 28G MISC 28 g by Does not apply route QID. 04/04/17   Brayton Caves, PA-C    Family History Family History  Problem Relation Age of Onset   Heart disease Mother    Hypertension Mother    Stroke Mother    Mental illness Mother    Heart disease Father    Hypertension Father    Diabetes Father    Colon polyps Brother    Heart disease Maternal Grandfather    Cancer Paternal Grandmother        mouth   Anesthesia problems Daughter    Breast cancer Maternal Aunt    Throat cancer Maternal Uncle    Thyroid disease Paternal Aunt    Hypotension Neg Hx    Malignant hyperthermia Neg Hx    Pseudochol deficiency Neg Hx    Colon cancer Neg Hx    Stomach cancer Neg Hx    Esophageal cancer Neg Hx    Pancreatic cancer  Neg Hx    Rectal cancer Neg Hx     Social History Social History   Tobacco Use   Smoking status: Every Day    Packs/day: 0.50    Years: 50.00    Total pack years: 25.00    Types: Cigarettes   Smokeless tobacco: Never  Vaping Use   Vaping Use: Some days   Substances: Nicotine  Substance Use Topics   Alcohol use: Yes    Comment: once a week   Drug use: Yes    Types: Marijuana    Comment: history of cocaine use, been about a year, per patient (12/18/21)     Allergies   Avocado, Latex, and Codeine   Review of Systems Review of Systems  Respiratory:  Positive for cough.   Per HPI   Physical Exam Triage Vital Signs ED Triage Vitals  Enc Vitals Group     BP 09/04/22 1025 (!) 167/82     Pulse Rate 09/04/22 1025 60     Resp 09/04/22 1025 (!) 24     Temp 09/04/22 1025 98.2 F (36.8 C)  Temp Source 09/04/22 1025 Oral     SpO2 09/04/22 1025 94 %     Weight --      Height --      Head Circumference --      Peak Flow --      Pain Score 09/04/22 1028 0     Pain Loc --      Pain Edu? --      Excl. in [place]? --    No data found.  Updated Vital Signs BP (!) 167/82 (BP Location: Right Arm)   Pulse 60   Temp 98.2 F (36.8 C) (Oral)   Resp (!) 24   SpO2 94%   Visual Acuity Right Eye Distance:   Left Eye Distance:   Bilateral Distance:    Right Eye Near:   Left Eye Near:    Bilateral Near:     Physical Exam Vitals and nursing note reviewed.  Constitutional:      Appearance: She is not ill-appearing or toxic-appearing.  HENT:     Head: Normocephalic and atraumatic.     Right Ear: Hearing, tympanic membrane, ear canal and external ear normal.     Left Ear: Hearing, tympanic membrane, ear canal and external ear normal.     Nose: Nose normal. No congestion.     Mouth/Throat:     Lips: Pink.     Mouth: Mucous membranes are moist. No injury.     Tongue: No lesions. Tongue does not deviate from midline.     Palate: No mass and lesions.     Pharynx: Oropharynx  is clear. Uvula midline. No pharyngeal swelling, oropharyngeal exudate, posterior oropharyngeal erythema or uvula swelling.     Tonsils: No tonsillar exudate or tonsillar abscesses.  Eyes:     General: Lids are normal. Vision grossly intact. Gaze aligned appropriately.        Right eye: No discharge.        Left eye: No discharge.     Extraocular Movements: Extraocular movements intact.     Conjunctiva/sclera: Conjunctivae normal.  Cardiovascular:     Rate and Rhythm: Normal rate and regular rhythm.     Heart sounds: Normal heart sounds, S1 normal and S2 normal.  Pulmonary:     Effort: No respiratory distress.     Breath sounds: Normal air entry. Wheezing present. No rhonchi or rales.     Comments: Diffuse inspiratory and expiratory wheezing heard to all lung fields of the chest with evidence of prolonged expiration.  No respiratory distress, patient speaking in full sentences without difficulty.  Oxygenating at 94% on room air at her baseline. Abdominal:     General: Abdomen is flat. Bowel sounds are normal.     Palpations: Abdomen is soft.     Tenderness: There is no abdominal tenderness. There is no right CVA tenderness or left CVA tenderness.  Musculoskeletal:     Cervical back: Neck supple.     Right lower leg: No edema.     Left lower leg: No edema.  Lymphadenopathy:     Cervical: No cervical adenopathy.  Skin:    General: Skin is warm and dry.     Capillary Refill: Capillary refill takes less than 2 seconds.     Findings: No rash.  Neurological:     General: No focal deficit present.     Mental Status: She is alert and oriented to person, place, and time. Mental status is at baseline.     Cranial Nerves: No dysarthria or  facial asymmetry.  Psychiatric:        Mood and Affect: Mood normal.        Speech: Speech normal.        Behavior: Behavior normal.        Thought Content: Thought content normal.        Judgment: Judgment normal.      UC Treatments / Results   Labs (all labs ordered are listed, but only abnormal results are displayed) Labs Reviewed - No data to display  EKG   Radiology No results found.  Procedures Procedures (including critical care time)  Medications Ordered in UC Medications  methylPREDNISolone sodium succinate (SOLU-MEDROL) 125 mg/2 mL injection 80 mg (80 mg Intramuscular Given 09/04/22 1123)  ipratropium-albuterol (DUONEB) 0.5-2.5 (3) MG/3ML nebulizer solution 3 mL (3 mLs Nebulization Given 09/04/22 1149)    Initial Impression / Assessment and Plan / UC Course  I have reviewed the triage vital signs and the nursing notes.  Pertinent labs & imaging results that were available during my care of the patient were reviewed by me and considered in my medical decision making (see chart for details).   1.  COPD exacerbation Presentation is consistent with acute COPD exacerbation. Patient has a history of QT prolongation, her most recent EKG in August 2023 shows QT interval of 470.  EKG obtained today to ensure that this has not worsened prior to giving DuoNeb breathing treatment in clinic.  Corrected QT interval today is 463.  DuoNeb provided as well as Solu-Medrol 80 mg IM for cough, wheeze, and inflammation to the lungs related to COPD exacerbation.  Deferred azithromycin antibiotic prescription, doxycycline used instead due to concern for prolonged QT interval with azithromycin use.  Doxycycline twice daily for the next 5 days sent to pharmacy to be taken as prescribed with food.  May continue using breathing treatments at home every 4-6 hours as needed for cough, shortness of breath, and wheeze.  Prednisone 40 mg burst sent to pharmacy to be taken as prescribed with food once daily in the mornings, no NSAIDs while taking steroid burst.  May use Tylenol as needed for aches, pains, and fever/chills.  May use Tessalon Perles every 8 hours as needed for cough.  Recommend follow-up with pulmonology and PCP for ongoing evaluation and  further prevention of COPD exacerbations.  Deferred imaging based on stable cardiopulmonary exam and hemodynamically stable vital signs.  Patient states that her symptoms improved significantly after the DuoNeb treatment in clinic.   Discussed physical exam and available lab work findings in clinic with patient.  Counseled patient regarding appropriate use of medications and potential side effects for all medications recommended or prescribed today. Discussed red flag signs and symptoms of worsening condition,when to call the PCP office, return to urgent care, and when to seek higher level of care in the emergency department. Patient verbalizes understanding and agreement with plan. All questions answered. Patient discharged in stable condition.    Final Clinical Impressions(s) / UC Diagnoses   Final diagnoses:  COPD exacerbation ([place])  H/O prolonged Q-T interval on ECG     Discharge Instructions      You were seen urgent care today for your COPD exacerbation.  I gave you a breathing treatment here which helped with her shortness of breath as well as a steroid injection.  I would like for you to start taking prednisone 40 mg once daily for the next 5 days starting tomorrow. Take this in addition to your 20 mg prednisone  that you already take on a daily basis. Take this medicine with food to avoid stomach upset.  You may continue using your DuoNeb/albuterol breathing treatments every 4-6 hours as needed for shortness of breath and cough.  Take doxycycline antibiotic twice daily for the next 5 days with food.  You may continue taking Tessalon Perles every 8 hours as needed for coughing.  Please schedule an appointment to follow-up with Dr. Karle Plumber for ongoing evaluation and management of your asthma/COPD.  If you develop any new or worsening symptoms or do not improve in the next 2 to 3 days, please return.  If your symptoms are severe, please go to the emergency room.   Follow-up with your primary care provider for further evaluation and management of your symptoms as well as ongoing wellness visits.  I hope you feel better!     ED Prescriptions     Medication Sig Dispense Auth. Provider   doxycycline (VIBRAMYCIN) 100 MG capsule Take 1 capsule (100 mg total) by mouth 2 (two) times daily for 5 days. 10 capsule Joella Prince M, FNP   predniSONE (DELTASONE) 20 MG tablet Take 2 tablets (40 mg total) by mouth daily for 5 days. 10 tablet Joella Prince M, FNP   benzonatate (TESSALON) 200 MG capsule Take 1 capsule (200 mg total) by mouth 2 (two) times daily as needed for cough. 20 capsule Talbot Grumbling, FNP      PDMP not reviewed this encounter.   Talbot Grumbling,  09/06/22 1701

## 2022-09-04 NOTE — ED Triage Notes (Signed)
Reports she started symptoms for a week.  Runny nose, sometimes worse than others.  Patient complains of cough, chest congestion, and sob.  Patient reports chest feels tight.  Speaking in complete sentences.  Denies pain, complains of fatigue.    Has taken a sinus medicine yesterday.  Reports nose was constantly running and went through a box of kleenex and now reports a sore in nose.    Patient also gets weekly iron infusions.

## 2022-09-04 NOTE — Discharge Instructions (Signed)
You were seen urgent care today for your COPD exacerbation.  I gave you a breathing treatment here which helped with her shortness of breath as well as a steroid injection.  I would like for you to start taking prednisone 40 mg once daily for the next 5 days starting tomorrow. Take this in addition to your 20 mg prednisone that you already take on a daily basis. Take this medicine with food to avoid stomach upset.  You may continue using your DuoNeb/albuterol breathing treatments every 4-6 hours as needed for shortness of breath and cough.  Take doxycycline antibiotic twice daily for the next 5 days with food.  You may continue taking Tessalon Perles every 8 hours as needed for coughing.  Please schedule an appointment to follow-up with Dr. Karle Plumber for ongoing evaluation and management of your asthma/COPD.  If you develop any new or worsening symptoms or do not improve in the next 2 to 3 days, please return.  If your symptoms are severe, please go to the emergency room.  Follow-up with your primary care provider for further evaluation and management of your symptoms as well as ongoing wellness visits.  I hope you feel better!

## 2022-09-05 ENCOUNTER — Other Ambulatory Visit (HOSPITAL_COMMUNITY): Payer: Self-pay | Admitting: Physician Assistant

## 2022-09-05 ENCOUNTER — Other Ambulatory Visit: Payer: Self-pay

## 2022-09-05 ENCOUNTER — Other Ambulatory Visit: Payer: Self-pay | Admitting: Internal Medicine

## 2022-09-05 ENCOUNTER — Other Ambulatory Visit: Payer: Self-pay | Admitting: Family Medicine

## 2022-09-05 DIAGNOSIS — I10 Essential (primary) hypertension: Secondary | ICD-10-CM

## 2022-09-05 DIAGNOSIS — I25118 Atherosclerotic heart disease of native coronary artery with other forms of angina pectoris: Secondary | ICD-10-CM

## 2022-09-05 DIAGNOSIS — E039 Hypothyroidism, unspecified: Secondary | ICD-10-CM

## 2022-09-05 DIAGNOSIS — G4700 Insomnia, unspecified: Secondary | ICD-10-CM

## 2022-09-05 DIAGNOSIS — F3342 Major depressive disorder, recurrent, in full remission: Secondary | ICD-10-CM

## 2022-09-05 MED ORDER — LEVOTHYROXINE SODIUM 112 MCG PO TABS
112.0000 ug | ORAL_TABLET | Freq: Every day | ORAL | 0 refills | Status: DC
  Filled 2022-09-05: qty 90, 90d supply, fill #0

## 2022-09-05 MED ORDER — NEBIVOLOL HCL 10 MG PO TABS
10.0000 mg | ORAL_TABLET | Freq: Every day | ORAL | 0 refills | Status: DC
  Filled 2022-09-05: qty 90, fill #0

## 2022-09-05 MED ORDER — TICAGRELOR 60 MG PO TABS
ORAL_TABLET | ORAL | 52 refills | Status: DC
  Filled 2022-09-05: qty 180, 90d supply, fill #0

## 2022-09-05 MED ORDER — MONTELUKAST SODIUM 10 MG PO TABS
10.0000 mg | ORAL_TABLET | Freq: Every day | ORAL | 0 refills | Status: DC
  Filled 2022-09-05: qty 90, 90d supply, fill #0

## 2022-09-05 MED ORDER — AMLODIPINE BESYLATE 5 MG PO TABS
5.0000 mg | ORAL_TABLET | Freq: Every day | ORAL | 0 refills | Status: DC
  Filled 2022-09-05: qty 90, 90d supply, fill #0

## 2022-09-05 NOTE — Telephone Encounter (Signed)
Requested Prescriptions  Pending Prescriptions Disp Refills   amLODipine (NORVASC) 5 MG tablet 90 tablet 0    Sig: Take 1 tablet (5 mg total) by mouth daily.     Cardiovascular: Calcium Channel Blockers 2 Failed - 09/05/2022  4:39 PM      Failed - Last BP in normal range    BP Readings from Last 1 Encounters:  09/04/22 (!) 167/82         Passed - Last Heart Rate in normal range    Pulse Readings from Last 1 Encounters:  09/04/22 60         Passed - Valid encounter within last 6 months    Recent Outpatient Visits           1 month ago Viral respiratory illness   [place] Karle Plumber B, MD   9 months ago Pulmonary emphysema, unspecified emphysema type [facility])   [place] Elsie Stain, MD   10 months ago Preoperative evaluation to rule out surgical contraindication   [place] Ladell Pier, MD   11 months ago Preoperative evaluation to rule out surgical contraindication   [place] Ladell Pier, MD   1 year ago COPD with chronic bronchitis [facility])   [place], MD       Future Appointments             In 5 days Ladell Pier, MD [place]             nebivolol (BYSTOLIC) 10 MG tablet 90 tablet 0    Sig: TAKE 1 TABLET (10 MG TOTAL) BY MOUTH DAILY.     Cardiovascular: Beta Blockers 3 Failed - 09/05/2022  4:39 PM      Failed - AST in normal range and within 360 days    AST  Date Value Ref Range Status  08/21/2022 13 (L) 15 - 41 U/L Final         Failed - Last BP in normal range    BP Readings from Last 1 Encounters:  09/04/22 (!) 167/82         Passed - Cr in normal range and within 360 days    Creatinine  Date Value Ref Range Status  08/21/2022 0.88 0.44 - 1.00 mg/dL Final   Creat  Date Value Ref  Range Status  04/16/2016 0.93 0.50 - 1.05 mg/dL Final    Comment:      For patients > or = 67 years of age: The upper reference limit for Creatinine is approximately 13% higher for people identified as African-American.      Creatinine,U  Date Value Ref Range Status  01/11/2010 61.7 mg/dL Final    Comment:    See lab report for associated comment(s)         Passed - ALT in normal range and within 360 days    ALT  Date Value Ref Range Status  08/21/2022 13 0 - 44 U/L Final         Passed - Last Heart Rate in normal range    Pulse Readings from Last 1 Encounters:  09/04/22 60         Passed - Valid encounter within last 6 months    Recent Outpatient Visits  1 month ago Viral respiratory illness   [place] Karle Plumber B, MD   9 months ago Pulmonary emphysema, unspecified emphysema type [facility])   [place] Elsie Stain, MD   10 months ago Preoperative evaluation to rule out surgical contraindication   [place], MD   11 months ago Preoperative evaluation to rule out surgical contraindication   [place] Ladell Pier, MD   1 year ago COPD with chronic bronchitis [facility])   [place], MD       Future Appointments             In 5 days Ladell Pier, MD [place]             montelukast (SINGULAIR) 10 MG tablet 90 tablet 0    Sig: Take 1 tablet (10 mg total) by mouth at bedtime.     Pulmonology:  Leukotriene Inhibitors Passed - 09/05/2022  4:39 PM      Passed - Valid encounter within last 12 months    Recent Outpatient Visits           1 month ago Viral respiratory illness   [place], MD   9 months ago Pulmonary emphysema,  unspecified emphysema type [facility])   [place] Elsie Stain, MD   10 months ago Preoperative evaluation to rule out surgical contraindication   [place] Ladell Pier, MD   11 months ago Preoperative evaluation to rule out surgical contraindication   [place] Ladell Pier, MD   1 year ago COPD with chronic bronchitis [facility])   [place], MD       Future Appointments             In 5 days Ladell Pier, MD [place]             levothyroxine (SYNTHROID) 112 MCG tablet 90 tablet 0    Sig: Take 1 tablet (112 mcg total) by mouth daily.     Endocrinology:  Hypothyroid Agents Passed - 09/05/2022  4:39 PM      Passed - TSH in normal range and within 360 days    TSH  Date Value Ref Range Status  10/25/2021 0.502 0.450 - 4.500 uIU/mL Final         Passed - Valid encounter within last 12 months    Recent Outpatient Visits           1 month ago Viral respiratory illness   [place] Ladell Pier, MD   9 months ago Pulmonary emphysema, unspecified emphysema type [facility])   [place] Elsie Stain, MD   10 months ago Preoperative evaluation to rule out surgical contraindication   [place] Ladell Pier, MD   11 months ago Preoperative evaluation to rule out surgical contraindication   [place] Ladell Pier, MD   1 year ago COPD with chronic bronchitis [facility])   [place]  [place], MD       Future Appointments             In 5 days Ladell Pier, MD [place]

## 2022-09-06 ENCOUNTER — Other Ambulatory Visit: Payer: Self-pay

## 2022-09-06 ENCOUNTER — Inpatient Hospital Stay: Payer: Medicare Other

## 2022-09-06 ENCOUNTER — Encounter: Payer: Self-pay | Admitting: Hematology

## 2022-09-10 ENCOUNTER — Ambulatory Visit: Payer: Medicare Other | Attending: Internal Medicine | Admitting: Internal Medicine

## 2022-09-10 ENCOUNTER — Encounter: Payer: Self-pay | Admitting: Internal Medicine

## 2022-09-10 ENCOUNTER — Other Ambulatory Visit: Payer: Self-pay

## 2022-09-10 VITALS — BP 134/70 | HR 64 | Temp 98.4°F | Ht 61.0 in | Wt 154.0 lb

## 2022-09-10 DIAGNOSIS — Z716 Tobacco abuse counseling: Secondary | ICD-10-CM | POA: Diagnosis not present

## 2022-09-10 DIAGNOSIS — Z122 Encounter for screening for malignant neoplasm of respiratory organs: Secondary | ICD-10-CM | POA: Insufficient documentation

## 2022-09-10 DIAGNOSIS — D509 Iron deficiency anemia, unspecified: Secondary | ICD-10-CM | POA: Diagnosis not present

## 2022-09-10 DIAGNOSIS — F17200 Nicotine dependence, unspecified, uncomplicated: Secondary | ICD-10-CM | POA: Diagnosis not present

## 2022-09-10 DIAGNOSIS — J439 Emphysema, unspecified: Secondary | ICD-10-CM | POA: Insufficient documentation

## 2022-09-10 DIAGNOSIS — K921 Melena: Secondary | ICD-10-CM

## 2022-09-10 DIAGNOSIS — F419 Anxiety disorder, unspecified: Secondary | ICD-10-CM | POA: Insufficient documentation

## 2022-09-10 DIAGNOSIS — Z79899 Other long term (current) drug therapy: Secondary | ICD-10-CM | POA: Insufficient documentation

## 2022-09-10 DIAGNOSIS — I10 Essential (primary) hypertension: Secondary | ICD-10-CM

## 2022-09-10 DIAGNOSIS — F17210 Nicotine dependence, cigarettes, uncomplicated: Secondary | ICD-10-CM | POA: Insufficient documentation

## 2022-09-10 DIAGNOSIS — Z780 Asymptomatic menopausal state: Secondary | ICD-10-CM | POA: Insufficient documentation

## 2022-09-10 DIAGNOSIS — Z7902 Long term (current) use of antithrombotics/antiplatelets: Secondary | ICD-10-CM | POA: Insufficient documentation

## 2022-09-10 DIAGNOSIS — J449 Chronic obstructive pulmonary disease, unspecified: Secondary | ICD-10-CM | POA: Diagnosis not present

## 2022-09-10 DIAGNOSIS — Z79890 Hormone replacement therapy (postmenopausal): Secondary | ICD-10-CM | POA: Insufficient documentation

## 2022-09-10 DIAGNOSIS — R7303 Prediabetes: Secondary | ICD-10-CM | POA: Insufficient documentation

## 2022-09-10 DIAGNOSIS — E039 Hypothyroidism, unspecified: Secondary | ICD-10-CM | POA: Diagnosis not present

## 2022-09-10 DIAGNOSIS — I2510 Atherosclerotic heart disease of native coronary artery without angina pectoris: Secondary | ICD-10-CM | POA: Diagnosis not present

## 2022-09-10 DIAGNOSIS — D500 Iron deficiency anemia secondary to blood loss (chronic): Secondary | ICD-10-CM | POA: Diagnosis not present

## 2022-09-10 DIAGNOSIS — F1910 Other psychoactive substance abuse, uncomplicated: Secondary | ICD-10-CM | POA: Insufficient documentation

## 2022-09-10 DIAGNOSIS — F325 Major depressive disorder, single episode, in full remission: Secondary | ICD-10-CM | POA: Diagnosis not present

## 2022-09-10 NOTE — Progress Notes (Signed)
Patient ID: [name] [name], female    DOB: May 13, 1956  MRN: [id number]  CC: Follow-up (Respiratory f/u. )   Subjective: [name] [name] is a 67 y.o. female who presents for chronic ds management Her concerns today include:  Patient with history of pre-DM, GERD, hypothyroidism, HTN, HL, CAD with previous stent, COPD, substance abuse, depression/anxiety and tobacco dependence, essential thrombocytosis, iron deficiency anemia   CAD/HTN/HL: no CP, LE edema.  Not taking ASA consistently but takes the Brillinta Also compliant with taking amlodipine 5 mg daily, atorvastatin 80 mg daily, furosemide 20 mg daily, Bystolic 10 mg daily,  PREDM :  on Metformin which he is taking and tolerating.  COPD/tobacco dependence: had flare last wk and was seen at urgent care.  Feeling better.  Using trelegy inhaler daily.  Does not have to use neb unless she is having a flare up.  Uses Albuterol inhaler once a day in mornings. -still smoking a pack/day.  Not ready to quit. -agrees to lung CA screen with CT of the chest.  Last CT of the chest was done 03/2019 which revealed bilateral pulmonary nodules.  IDA/thrombocytosis: saw Dr. Irene Limbo 08/21/2022 for f/u IDA and elev PLT.  Reports having 1 iron infusion a few weeks ago.  Last hemoglobin 14.9.  Platelet count has come down and was thought to be due to the iron deficiency anemia. Reports intermittent black stools. Had EGD which revealed some erythema of the antrum and negative capsule EDG 11/2021 by Dr. Ardis Hughs.  Up-to-date with colonoscopy.  On Brilinta.  Hypothyroid;  Compliant with levothyroxine 112 mcg daily.  Due for TSH check.  Anx/Dep:  Plugged in with [facility].  Doing good on current medications and feels her depression is in remission.   Patient Active Problem List   Diagnosis Date Noted   Nontraumatic complete tear of right rotator cuff    Superior glenoid labrum lesion of left shoulder    Iron deficiency anemia 11/02/2021   Major depressive disorder,  recurrent, in full remission with anxious distress ([place]) 05/31/2021   Rotator cuff tendinitis, right 05/08/2021   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   Chronic neck pain 02/26/2021   GAD (generalized anxiety disorder) 01/08/2021   Acute cystitis without hematuria 11/07/2020   Influenza vaccine needed 06/15/2020   Abdominal pain, epigastric 04/28/2019   Esophageal dysphagia 04/28/2019   Bipolar I disorder ([place]) 09/17/2018   Insomnia 10/28/2017   Prediabetes 05/28/2017   QT prolongation 03/25/2017   Encounter for screening mammogram for breast cancer 09/11/2015   Psoriasis 06/22/2015   COPD (chronic obstructive pulmonary disease) (HCC)GOLD E 05/18/2015   Anxiety and depression 07/10/2013   Essential hypertension, benign 06/07/2013   Dyslipidemia 06/07/2013   Asthma, chronic 06/07/2013   Emphysema lung ([place]) 06/07/2013   Chronic back pain 06/07/2013   CAD S/P percutaneous coronary angioplasty 06/07/2013   GERD (gastroesophageal reflux disease) 06/07/2013   Breast lump on left side at 3 o'clock position 10/13/2012   S/P abdominal hysterectomy and right salpingo-oophorectomy 08/29/2011   TOBACCO ABUSE 07/17/2009   Chronic rhinitis 07/17/2009   Lung nodule < 6cm on CT 07/17/2009   ALLERGY, FOOD 07/17/2009   Hypothyroidism 12/22/2008     Current Outpatient Medications on File Prior to Visit  Medication Sig Dispense Refill   albuterol (VENTOLIN HFA) 108 (90 Base) MCG/ACT inhaler INHALE 2 PUFFS INTO THE LUNGS EVERY 6 (SIX) HOURS AS NEEDED FOR WHEEZING OR SHORTNESS OF BREATH. 18 g 0   albuterol (VENTOLIN HFA) 108 (90  Base) MCG/ACT inhaler INHALE 2 PUFFS INTO THE LUNGS EVERY 6 (SIX) HOURS AS NEEDED FOR WHEEZING OR SHORTNESS OF BREATH. 18 g 0   amLODipine (NORVASC) 5 MG tablet Take 1 tablet (5 mg total) by mouth daily. 90 tablet 0   aspirin EC 81 MG tablet Take 81 mg by mouth daily.     atorvastatin (LIPITOR) 80 MG tablet Take 1 tablet (80 mg total) by mouth daily. please  schedule appointment for more refills 90 tablet 3   benzonatate (TESSALON) 200 MG capsule Take 1 capsule (200 mg total) by mouth 2 (two) times daily as needed for cough. 20 capsule 0   Blood Glucose Monitoring Suppl (TRUE METRIX METER) DEVI 1 kit by Does not apply route 4 (four) times daily. 1 Device 0   busPIRone (BUSPAR) 15 MG tablet TAKE 2 TABLETS IN THE MORNING, 1 TAB AT NOON AND 1 TAB IN THE EVENING 120 tablet 3   clonazePAM (KLONOPIN) 0.5 MG tablet Take 1 tablet (0.5 mg total) by mouth 2 (two) times daily as needed for anxiety. 60 tablet 1   doxepin (SINEQUAN) 75 MG capsule Take 1 capsule (75 mg total) by mouth at bedtime. 30 capsule 3   escitalopram (LEXAPRO) 10 MG tablet TAKE 3 TABLETS (30 MG TOTAL) BY MOUTH DAILY. 90 tablet 3   ferrous sulfate 325 (65 FE) MG tablet Take 1 tablet (325 mg total) by mouth daily with breakfast. 100 tablet 1   fluticasone (FLONASE) 50 MCG/ACT nasal spray Place 2 sprays into both nostrils daily. 16 g 0   Fluticasone-Umeclidin-Vilant (TRELEGY ELLIPTA) 100-62.5-25 MCG/ACT AEPB Inhale 1 puff into the lungs daily. 60 each 6   furosemide (LASIX) 20 MG tablet Take 1 tablet (20 mg total) by mouth daily. 30 tablet 2   gabapentin (NEURONTIN) 600 MG tablet TAKE 1 TABLET BY MOUTH 4 TIMES DAILY 360 tablet 1   glucose blood (TRUE METRIX BLOOD GLUCOSE TEST) test strip Use as instructed 100 each 12   ipratropium-albuterol (DUONEB) 0.5-2.5 (3) MG/3ML SOLN TAKE 3 MLS VIA NEBULIZATION EVERY 6 HOURS 360 mL 1   levothyroxine (SYNTHROID) 112 MCG tablet Take 1 tablet (112 mcg total) by mouth daily. 90 tablet 0   loperamide (IMODIUM A-D) 2 MG tablet Take 4 mg by mouth 4 (four) times daily as needed for diarrhea or loose stools.     losartan (COZAAR) 50 MG tablet Take 1.5 tablets (75 mg total) by mouth daily. 135 tablet 0   metFORMIN (GLUCOPHAGE) 500 MG tablet TAKE 1 TABLET (500 MG TOTAL) BY MOUTH 2 (TWO) TIMES DAILY WITH A MEAL. 60 tablet 1   mirtazapine (REMERON) 30 MG tablet TAKE 1  TABLET (30 MG TOTAL) BY MOUTH AT BEDTIME. 30 tablet 3   montelukast (SINGULAIR) 10 MG tablet Take 1 tablet (10 mg total) by mouth at bedtime. 90 tablet 0   nebivolol (BYSTOLIC) 10 MG tablet Take 1 tablet (10 mg total) by mouth daily. 90 tablet 0   omeprazole (PRILOSEC) 40 MG capsule Take 1 capsule (40 mg total) by mouth 2 (two) times daily before a meal. 60 capsule 5   sodium chloride (OCEAN) 0.65 % SOLN nasal spray Place 1 spray into both nostrils as needed for congestion.     Spacer/Aero-Holding Chambers (AEROCHAMBER MV) inhaler Use as instructed 1 each 1   ticagrelor (BRILINTA) 60 MG TABS tablet TAKE 1 TABLET (60 MG TOTAL) BY MOUTH 2 (TWO) TIMES DAILY. 60 tablet 52   traMADol (ULTRAM) 50 MG tablet Take 1 tablet (50  mg total) by mouth every 6 (six) hours as needed. 40 tablet 0   TRUEPLUS LANCETS 28G MISC 28 g by Does not apply route QID. 120 each 2   Chlorpheniramine-DM (COUGH & COLD HBP PO) Take 1 tablet by mouth daily as needed (congestion).     methocarbamol (ROBAXIN) 500 MG tablet Take 1 tablet (500 mg total) by mouth 2 (two) times daily as needed for muscle spasms. (Must have office visit for refills.) 60 tablet 0   nitroGLYCERIN (NITROSTAT) 0.4 MG SL tablet Place 1 tablet (0.4 mg total) under the tongue every 5 (five) minutes as needed for chest pain. 25 tablet 3   No current facility-administered medications on file prior to visit.    Allergies  Allergen Reactions   Avocado Anaphylaxis   Latex Shortness Of Breath and Rash   Codeine Nausea Only    Social History   Socioeconomic History   Marital status: Divorced    Spouse name: Not on file   Number of children: 1   Years of education: Not on file   Highest education level: 9th grade  Occupational History   Not on file  Tobacco Use   Smoking status: Every Day    Packs/day: 0.50    Years: 50.00    Total pack years: 25.00    Types: Cigarettes   Smokeless tobacco: Never  Vaping Use   Vaping Use: Some days   Substances:  Nicotine  Substance and Sexual Activity   Alcohol use: Yes    Comment: once a week   Drug use: Yes    Types: Marijuana    Comment: history of cocaine use, been about a year, per patient (12/18/21)   Sexual activity: Not Currently    Birth control/protection: Surgical  Other Topics Concern   Not on file  Social History Narrative   Not on file   Social Determinants of Health   Financial Resource Strain: Low Risk  (06/09/2018)   Overall Financial Resource Strain (CARDIA)    Difficulty of Paying Living Expenses: Not hard at all  Food Insecurity: Food Insecurity Present (12/20/2021)   Hunger Vital Sign    Worried About [place] in the Last Year: Sometimes true    Ran Out of Food in the Last Year: Sometimes true  Transportation Needs: Unmet Transportation Needs (12/20/2021)   PRAPARE - Hydrologist (Medical): Yes    Lack of Transportation (Non-Medical): No  Physical Activity: Inactive (12/20/2021)   Exercise Vital Sign    Days of Exercise per Week: 0 days    Minutes of Exercise per Session: 0 min  Stress: Stress Concern Present (12/20/2021)   [place]    Feeling of Stress : To some extent  Social Connections: Moderately Isolated (12/20/2021)   Social Connection and Isolation Panel [NHANES]    Frequency of Communication with Friends and Family: More than three times a week    Frequency of Social Gatherings with Friends and Family: More than three times a week    Attends Religious Services: 1 to 4 times per year    Active Member of [employer] or Organizations: No    Attends Archivist Meetings: Never    Marital Status: Divorced  Human resources officer Violence: Not At Risk (12/20/2021)   Humiliation, Afraid, Rape, and Kick questionnaire    Fear of Current or Ex-Partner: No    Emotionally Abused: No    Physically Abused: No  Sexually Abused: No    Family History  Problem Relation  Age of Onset   Heart disease Mother    Hypertension Mother    Stroke Mother    Mental illness Mother    Heart disease Father    Hypertension Father    Diabetes Father    Colon polyps Brother    Heart disease Maternal Grandfather    Cancer Paternal Grandmother        [place]   Anesthesia problems Daughter    Breast cancer Maternal Aunt    Throat cancer Maternal Uncle    Thyroid disease Paternal Aunt    Hypotension Neg Hx    Malignant hyperthermia Neg Hx    Pseudochol deficiency Neg Hx    Colon cancer Neg Hx    Stomach cancer Neg Hx    Esophageal cancer Neg Hx    Pancreatic cancer Neg Hx    Rectal cancer Neg Hx     Past Surgical History:  Procedure Laterality Date   ABDOMINAL EXPLORATION SURGERY  1977   ABDOMINAL HYSTERECTOMY  1977   "left one of my ovaries"   BACK SURGERY     BIOPSY  05/27/2019   Procedure: BIOPSY;  Surgeon: Daneil Dolin, MD;  [facility];  Service: Endoscopy;;   COLONOSCOPY  2011   Dr. Ardis Hughs: Mild diverticulosis, descending diminutive colon polyp (not retrieved), next colonoscopy 10 years   CORONARY ANGIOPLASTY WITH STENT PLACEMENT  2011 X2   "regular stents didn't work; had to go back in in ~ 1 month and put in medicated stents"   DILATION AND CURETTAGE OF UTERUS     ESOPHAGOGASTRODUODENOSCOPY     ESOPHAGOGASTRODUODENOSCOPY (EGD) WITH PROPOFOL N/A 05/27/2019   normal esophagus, dilation, erosive gastropathy s/p biopsy, normal duodenum. Negative H.pylori.    LEFT HEART CATH AND CORONARY ANGIOGRAPHY N/A 03/26/2017   Procedure: LEFT HEART CATH AND CORONARY ANGIOGRAPHY;  Surgeon: Belva Crome, MD;  Location: [place] [facility];  Service: Cardiovascular;  Laterality: N/A;   LUMBAR LAMINECTOMY/DECOMPRESSION MICRODISCECTOMY  08/05/2011   Procedure: LUMBAR LAMINECTOMY/DECOMPRESSION MICRODISCECTOMY;  Surgeon: Otilio Connors, MD;  Location: [place] [facility];  Service: Neurosurgery;  Laterality: Right;  Right Lumbar four-five extraforaminal discectomy    MALONEY DILATION N/A 05/27/2019   Procedure: Venia Minks DILATION;  Surgeon: Daneil Dolin, MD;  [facility];  Service: Endoscopy;  Laterality: N/A;  54   SHOULDER ARTHROSCOPY WITH ROTATOR CUFF REPAIR AND OPEN BICEPS TENODESIS Right 12/24/2021   Procedure: RIGHT SHOULDER ARTHROSCOPY WITH ROTATOR CUFF REPAIR AND BICEPS TENODESIS;  Surgeon: Marybelle Killings, MD;  Location: [place];  Service: Orthopedics;  Laterality: Right;   TONSILLECTOMY     as a child   UPPER GASTROINTESTINAL ENDOSCOPY      ROS: Review of Systems Negative except as stated above  PHYSICAL EXAM: BP 134/70 (BP Location: Left Arm, Patient Position: Sitting, Cuff Size: Normal)   Pulse 64   Temp 98.4 F (36.9 C) (Oral)   Ht '5\' 1"'$  (1.549 m)   Wt 154 lb (69.9 kg)   SpO2 95%   BMI 29.10 kg/m   Physical Exam  General appearance - alert, well appearing, older Caucasian female and in no distress Mental status - normal mood, behavior, speech, dress, motor activity, and thought processes Chest -breath sounds are slightly decreased bilaterally without crackles or wheezes.   Heart - normal rate, regular rhythm, normal S1, S2, no murmurs, rubs, clicks or gallops Extremities - peripheral pulses normal, no pedal edema,  no clubbing or cyanosis      Latest Ref Rng & Units 08/21/2022    1:15 PM 02/18/2022    1:51 PM 12/18/2021    3:40 PM  CMP  Glucose 70 - 99 mg/dL 124  105  112   BUN 8 - 23 mg/dL '14  15  12   '$ Creatinine 0.44 - 1.00 mg/dL 0.88  0.86  0.98   Sodium 135 - 145 mmol/L 140  139  139   Potassium 3.5 - 5.1 mmol/L 3.9  4.2  3.9   Chloride 98 - 111 mmol/L 105  106  105   CO2 22 - 32 mmol/L '28  25  24   '$ Calcium 8.9 - 10.3 mg/dL 9.2  9.7  9.2   Total Protein 6.5 - 8.1 g/dL 6.6  7.5    Total Bilirubin 0.3 - 1.2 mg/dL 0.3  0.1    Alkaline Phos 38 - 126 U/L 98  91    AST 15 - 41 U/L 13  19    ALT 0 - 44 U/L 13  15     Lipid Panel     Component Value Date/Time   CHOL 229 (H) 03/21/2022 1218   TRIG 99 03/21/2022  1218   HDL 76 03/21/2022 1218   CHOLHDL 3.0 03/21/2022 1218   CHOLHDL 3.8 04/16/2016 0934   VLDL 15 04/16/2016 0934   LDLCALC 136 (H) 03/21/2022 1218    CBC    Component Value Date/Time   WBC 9.4 08/21/2022 1315   WBC 7.1 11/28/2021 1645   RBC 4.62 08/21/2022 1315   HGB 14.9 08/21/2022 1315   HGB 13.0 09/14/2021 1058   HCT 44.8 08/21/2022 1315   HCT 41.5 09/14/2021 1058   PLT 418 (H) 08/21/2022 1315   PLT 541 (H) 09/14/2021 1058   MCV 97.0 08/21/2022 1315   MCV 87 09/14/2021 1058   MCH 32.3 08/21/2022 1315   MCHC 33.3 08/21/2022 1315   RDW 14.3 08/21/2022 1315   RDW 15.8 (H) 09/14/2021 1058   LYMPHSABS 1.8 08/21/2022 1315   LYMPHSABS 2.6 04/05/2019 0926   MONOABS 0.7 08/21/2022 1315   EOSABS 0.1 08/21/2022 1315   EOSABS 0.3 04/05/2019 0926   BASOSABS 0.0 08/21/2022 1315   BASOSABS 0.1 04/05/2019 0926    ASSESSMENT AND PLAN:  1. Pulmonary emphysema, unspecified emphysema type ([place]) Patient gets intermittent flares but stable at this time.  She will continue Trelegy inhaler.  Strongly advised to quit smoking.  2. TOBACCO ABUSE See #1 above.  3. Screening for lung cancer Overdue for lung cancer screening.  She is agreeable to screening.  Will refer to the lung cancer screening program. - Ambulatory Referral Lung Cancer Screening [place]  4. Depression, major, in remission ([place]) Plugged in with [place] [facility].  She feels she is stable on her current medications that include Lexapro, doxepin and Remeron  5. Coronary artery disease involving native coronary artery of native heart without angina pectoris Continue Bystolic, atorvastatin.  Will have her follow-up with her cardiologist to determine whether she still needs to be on the Brilinta given intermittent episodes of black stools.  6. Essential hypertension At goal.  Continue current medications including Bystolic 10 mg daily, Cozaar 50 mg daily, furosemide 20 mg daily, amlodipine 5 mg  daily.  7. Iron deficiency anemia due to chronic blood loss Last H&H within normal range.  She is no longer taking oral iron.  She gets iron infusion as needed through her hematologist Dr.  [place]k [place] - Ambulatory referral to Gastroenterology  8. Postmenopausal estrogen deficiency She is agreeable to bone density study - DG Bone Density; Future  9. Black stools Will have her follow-up with her gastroenterologist Dr. Ardis Hughs - Ambulatory referral to Gastroenterology  10. Acquired hypothyroidism - TSH   Patient was given the opportunity to ask questions.  Patient verbalized understanding of the plan and was able to repeat key elements of the plan.   This documentation was completed using Radio producer.  Any transcriptional errors are unintentional.  No orders of the defined types were placed in this encounter.    Requested Prescriptions    No prescriptions requested or ordered in this encounter    No follow-ups on file.  Karle Plumber, MD, FACP

## 2022-09-11 LAB — TSH: TSH: 3.18 u[IU]/mL (ref 0.450–4.500)

## 2022-09-12 ENCOUNTER — Other Ambulatory Visit: Payer: Self-pay

## 2022-09-13 ENCOUNTER — Other Ambulatory Visit: Payer: Self-pay | Admitting: Internal Medicine

## 2022-09-13 ENCOUNTER — Inpatient Hospital Stay: Payer: Medicare Other

## 2022-09-13 ENCOUNTER — Telehealth: Payer: Self-pay | Admitting: Hematology

## 2022-09-13 DIAGNOSIS — Z1231 Encounter for screening mammogram for malignant neoplasm of breast: Secondary | ICD-10-CM

## 2022-09-13 NOTE — Telephone Encounter (Signed)
Cancelled appointment due to lab being down. Patient unable to go to [facility]. Patient is aware that we will give her a call later to reschedule her for a different day.

## 2022-09-16 ENCOUNTER — Other Ambulatory Visit: Payer: Self-pay

## 2022-09-18 ENCOUNTER — Encounter: Payer: Self-pay | Admitting: Nurse Practitioner

## 2022-09-18 ENCOUNTER — Other Ambulatory Visit: Payer: Self-pay

## 2022-09-18 ENCOUNTER — Ambulatory Visit: Payer: Medicare Other | Attending: Nurse Practitioner | Admitting: Nurse Practitioner

## 2022-09-18 VITALS — BP 114/62 | HR 68 | Ht 61.5 in | Wt 147.0 lb

## 2022-09-18 DIAGNOSIS — E785 Hyperlipidemia, unspecified: Secondary | ICD-10-CM | POA: Diagnosis not present

## 2022-09-18 DIAGNOSIS — I2510 Atherosclerotic heart disease of native coronary artery without angina pectoris: Secondary | ICD-10-CM | POA: Diagnosis not present

## 2022-09-18 DIAGNOSIS — Z720 Tobacco use: Secondary | ICD-10-CM | POA: Insufficient documentation

## 2022-09-18 DIAGNOSIS — J439 Emphysema, unspecified: Secondary | ICD-10-CM | POA: Diagnosis not present

## 2022-09-18 DIAGNOSIS — I10 Essential (primary) hypertension: Secondary | ICD-10-CM | POA: Insufficient documentation

## 2022-09-18 MED ORDER — FUROSEMIDE 20 MG PO TABS
20.0000 mg | ORAL_TABLET | Freq: Every day | ORAL | 3 refills | Status: DC
  Filled 2022-09-18: qty 90, 90d supply, fill #0
  Filled 2022-12-20: qty 90, 90d supply, fill #1
  Filled 2023-03-31: qty 90, 90d supply, fill #2
  Filled 2023-06-26: qty 90, 90d supply, fill #3

## 2022-09-18 MED ORDER — AMLODIPINE BESYLATE 5 MG PO TABS
5.0000 mg | ORAL_TABLET | Freq: Every day | ORAL | 3 refills | Status: DC
  Filled 2022-09-18 – 2022-12-20 (×2): qty 90, 90d supply, fill #0
  Filled 2023-03-31: qty 90, 90d supply, fill #1
  Filled 2023-06-26: qty 90, 90d supply, fill #2

## 2022-09-18 MED ORDER — NITROGLYCERIN 0.4 MG SL SUBL
0.4000 mg | SUBLINGUAL_TABLET | SUBLINGUAL | 3 refills | Status: DC | PRN
  Filled 2022-09-18: qty 25, 8d supply, fill #0

## 2022-09-18 MED ORDER — LOSARTAN POTASSIUM 50 MG PO TABS
75.0000 mg | ORAL_TABLET | Freq: Every day | ORAL | 3 refills | Status: DC
  Filled 2022-09-18 – 2022-11-13 (×2): qty 135, 90d supply, fill #0
  Filled 2023-02-18: qty 135, 90d supply, fill #1
  Filled 2023-05-21: qty 135, 90d supply, fill #2
  Filled 2023-08-15: qty 135, 90d supply, fill #0

## 2022-09-18 MED ORDER — ATORVASTATIN CALCIUM 80 MG PO TABS
80.0000 mg | ORAL_TABLET | Freq: Every day | ORAL | 3 refills | Status: DC
  Filled 2022-09-18: qty 90, 90d supply, fill #0
  Filled 2022-12-20: qty 90, 90d supply, fill #1
  Filled 2023-03-31: qty 90, 90d supply, fill #2
  Filled 2023-06-26: qty 90, 90d supply, fill #3

## 2022-09-18 MED ORDER — TICAGRELOR 60 MG PO TABS
60.0000 mg | ORAL_TABLET | Freq: Two times a day (BID) | ORAL | 52 refills | Status: DC
  Filled 2022-09-18: qty 60, fill #0
  Filled 2022-11-13: qty 180, 90d supply, fill #0
  Filled 2023-02-18: qty 180, 90d supply, fill #1
  Filled 2023-05-21: qty 180, 90d supply, fill #2
  Filled 2023-08-15: qty 180, 90d supply, fill #0

## 2022-09-18 MED ORDER — NEBIVOLOL HCL 10 MG PO TABS
10.0000 mg | ORAL_TABLET | Freq: Every day | ORAL | 3 refills | Status: DC
  Filled 2022-09-18 – 2022-09-19 (×2): qty 90, 90d supply, fill #0
  Filled 2022-12-20: qty 90, 90d supply, fill #1
  Filled 2023-03-31: qty 90, 90d supply, fill #2
  Filled 2023-06-26: qty 90, 90d supply, fill #3

## 2022-09-18 NOTE — Progress Notes (Signed)
Office Visit    Patient Name: [name] [name] Date of Encounter: 09/18/2022  Primary Care Provider:  Ladell Pier, MD Primary Cardiologist:  Shelva Majestic, MD  Chief Complaint    67 year old female with a history of CAD s/p BMS-RCA, PTCA-LAD/diagonal in 2001, DES x 2 overlapping-RCA in 2011, hypertension, hyperlipidemia, COPD and tobacco use who presents for follow-up related to CAD.  Past Medical History    Past Medical History:  Diagnosis Date   Allergy    seasonal   Anemia    Anxiety    takes Lexapro daily   Arthritis    "back, from neck down pass my bra area" (03/25/2017)   Asthma    Bartholin gland cyst 08/29/2011   Bruises easily    pt is on Effient   Chronic back pain    herniated nucleus pulposus   Chronic back pain    "neck to bra area; lower back" (03/25/2017)   Chronic kidney disease    recurrent UTI's this year 2022   COPD (chronic obstructive pulmonary disease) (HCC)    early stages   Coronary artery disease    Depression    takes Klonopin daily   Diabetes mellitus without complication ([place])    Diverticulosis    Fibroadenoma of left breast    GERD (gastroesophageal reflux disease)    takes Nexium daily   H/O hiatal hernia    Heart attack ([place]) 2011   Hemorrhoids    Hernia    Hyperlipidemia    takes Lipitor daily   Hypertension    takes Losartan daily and Labetalol bid   Hypothyroidism    takes Synthroid daily   Insomnia    hydroxyzine prn   Joint pain    Pneumonia    "couple times" (03/25/2017)   Pre-diabetes    "just found out 1 wk ago" (03/25/2017)   Psoriasis    elbows,knees,back   Shortness of breath    with exertion   Slowing of urinary stream    Stress incontinence    Past Surgical History:  Procedure Laterality Date   [place]   "left one of my ovaries"   BACK SURGERY     BIOPSY  05/27/2019   Procedure: BIOPSY;  Surgeon: Daneil Dolin, MD;  Location: [facility];  Service: Endoscopy;;   COLONOSCOPY  2011   Dr. Ardis Hughs: Mild diverticulosis, descending diminutive colon polyp (not retrieved), next colonoscopy 10 years   CORONARY ANGIOPLASTY WITH STENT PLACEMENT  2011 X2   "regular stents didn't work; had to go back in in ~ 1 month and put in medicated stents"   DILATION AND CURETTAGE OF UTERUS     ESOPHAGOGASTRODUODENOSCOPY     ESOPHAGOGASTRODUODENOSCOPY (EGD) WITH PROPOFOL N/A 05/27/2019   normal esophagus, dilation, erosive gastropathy s/p biopsy, normal duodenum. Negative H.pylori.    LEFT HEART CATH AND CORONARY ANGIOGRAPHY N/A 03/26/2017   Procedure: LEFT HEART CATH AND CORONARY ANGIOGRAPHY;  Surgeon: Belva Crome, MD;  Location: [facility];  Service: Cardiovascular;  Laterality: N/A;   LUMBAR LAMINECTOMY/DECOMPRESSION MICRODISCECTOMY  08/05/2011   Procedure: LUMBAR LAMINECTOMY/DECOMPRESSION MICRODISCECTOMY;  Surgeon: Otilio Connors, MD;  Location: [facility];  Service: Neurosurgery;  Laterality: Right;  Right Lumbar four-five extraforaminal discectomy   MALONEY DILATION N/A 05/27/2019   Procedure: Venia Minks DILATION;  Surgeon: Daneil Dolin, MD;  Location: [facility];  Service: Endoscopy;  Laterality: N/A;  54   SHOULDER  ARTHROSCOPY WITH ROTATOR CUFF REPAIR AND OPEN BICEPS TENODESIS Right 12/24/2021   Procedure: RIGHT SHOULDER ARTHROSCOPY WITH ROTATOR CUFF REPAIR AND BICEPS TENODESIS;  Surgeon: Marybelle Killings, MD;  Location: [facility];  Service: Orthopedics;  Laterality: Right;   TONSILLECTOMY     as a child   UPPER GASTROINTESTINAL ENDOSCOPY      Allergies  Allergies  Allergen Reactions   Avocado Anaphylaxis   Latex Shortness Of Breath and Rash   Codeine Nausea Only     Labs/Other Studies Reviewed    The following studies were reviewed today: [facility] 26-Mar-2017: Chronic total occlusion of the right coronary due to in-stent restenosis. Left-to-right collaterals are noted. Diffuse 50% narrowing of the proximal LAD stent. Diffuse  40-50% in-stent restenosis of the first diagonal. 70% mid second obtuse marginal stenosis. 50% proximal third obtuse marginal stenosis. Widely patent left main coronary artery. Normal left ventricular systolic function, EF 123456, with EDP 5 mmHg.   RECOMMENDATIONS:   Aggressive risk factor modification Care management to help with acquiring medication.  Echo 03/26/2022:  IMPRESSIONS    1. Left ventricular ejection fraction, by estimation, is 60 to 65%. The  left ventricle has normal function. The left ventricle has no regional  wall motion abnormalities. Left ventricular diastolic parameters are  consistent with Grade I diastolic  dysfunction (impaired relaxation). The average left ventricular global  longitudinal strain is -22.8 %. The global longitudinal strain is normal.   2. Right ventricular systolic function is normal. The right ventricular  size is normal. Tricuspid regurgitation signal is inadequate for assessing  PA pressure.   3. The mitral valve is normal in structure. Trivial mitral valve  regurgitation.   4. The aortic valve is tricuspid. Aortic valve regurgitation is not  visualized.   5. The inferior vena cava is normal in size with greater than 50%  respiratory variability, suggesting right atrial pressure of 3 mmHg.   Comparison(s): No significant change from prior study. 03/26/17 EF 55-60%.    Recent Labs: 08/21/2022: ALT 13; BUN 14; Creatinine 0.88; Hemoglobin 14.9; Platelet Count 418; Potassium 3.9; Sodium 140 09/10/2022: TSH 3.180  Recent Lipid Panel    Component Value Date/Time   CHOL 229 (H) 03/21/2022 1218   TRIG 99 03/21/2022 1218   HDL 76 03/21/2022 1218   CHOLHDL 3.0 03/21/2022 1218   CHOLHDL 3.8 04/16/2016 0934   VLDL 15 04/16/2016 0934   LDLCALC 136 (H) 03/21/2022 1218    History of Present Illness    67 year old female with the above past medical history including CAD s/p BMS-RCA, PTCA-LAD/Diagonal in 2001, DES x 2 overlapping-RCA in 2011,  hypertension, hyperlipidemia, COPD and tobacco use.  Most recent cardiac catheterization in 2018 showed CTO of RCA (ISR) left to right collaterals, diffuse 50% narrowing of her proximal LAD, diffuse 40 to 50% ISR in the first diagonal, 70% mid OM2 stenosis, 50% proximal OM3 stenosis, EF 65%.  Medical management was recommended.  Echocardiogram the time showed EF 55 to 60%, no RWMA.  She has noted to be a hyporesponder to Plavix and was switched to Brilinta.  Nuclear stress test in 2021 was low risk.  Most recent echocardiogram in 03/26/22 showed EF 60 to 65%, G1 DD, no significant change from prior study.  She was last seen in the office on 03/11/2022 and was stable from a cardiac standpoint.  She continued to smoke at the time. She denied symptoms concerning for angina.   She presents today for follow-up.  Since her last visit she  is stable from a cardiac standpoint.  She has stable chronic dyspnea in the setting of COPD/asthma.  She continues to smoke.  She denies symptoms concerning for angina.  She is taking her medications as prescribed.  Overall, she reports feeling well.  Home Medications    Current Outpatient Medications  Medication Sig Dispense Refill   albuterol (VENTOLIN HFA) 108 (90 Base) MCG/ACT inhaler INHALE 2 PUFFS INTO THE LUNGS EVERY 6 (SIX) HOURS AS NEEDED FOR WHEEZING OR SHORTNESS OF BREATH. 18 g 0   amLODipine (NORVASC) 5 MG tablet Take 1 tablet (5 mg total) by mouth daily. 90 tablet 0   aspirin EC 81 MG tablet Take 81 mg by mouth daily.     atorvastatin (LIPITOR) 80 MG tablet Take 1 tablet (80 mg total) by mouth daily. please schedule appointment for more refills 90 tablet 3   benzonatate (TESSALON) 200 MG capsule Take 1 capsule (200 mg total) by mouth 2 (two) times daily as needed for cough. 20 capsule 0   Blood Glucose Monitoring Suppl (TRUE METRIX METER) DEVI 1 kit by Does not apply route 4 (four) times daily. 1 Device 0   busPIRone (BUSPAR) 15 MG tablet TAKE 2 TABLETS IN THE  MORNING, 1 TAB AT NOON AND 1 TAB IN THE EVENING 120 tablet 3   clonazePAM (KLONOPIN) 0.5 MG tablet Take 1 tablet (0.5 mg total) by mouth 2 (two) times daily as needed for anxiety. 60 tablet 1   doxepin (SINEQUAN) 75 MG capsule Take 1 capsule (75 mg total) by mouth at bedtime. 30 capsule 3   escitalopram (LEXAPRO) 10 MG tablet TAKE 3 TABLETS (30 MG TOTAL) BY MOUTH DAILY. 90 tablet 3   fluticasone (FLONASE) 50 MCG/ACT nasal spray Place 2 sprays into both nostrils daily. 16 g 0   Fluticasone-Umeclidin-Vilant (TRELEGY ELLIPTA) 100-62.5-25 MCG/ACT AEPB Inhale 1 puff into the lungs daily. 60 each 6   furosemide (LASIX) 20 MG tablet Take 1 tablet (20 mg total) by mouth daily. 30 tablet 2   gabapentin (NEURONTIN) 600 MG tablet TAKE 1 TABLET BY MOUTH 4 TIMES DAILY 360 tablet 1   glucose blood (TRUE METRIX BLOOD GLUCOSE TEST) test strip Use as instructed 100 each 12   ipratropium-albuterol (DUONEB) 0.5-2.5 (3) MG/3ML SOLN TAKE 3 MLS VIA NEBULIZATION EVERY 6 HOURS 360 mL 1   levothyroxine (SYNTHROID) 112 MCG tablet Take 1 tablet (112 mcg total) by mouth daily. 90 tablet 0   loperamide (IMODIUM A-D) 2 MG tablet Take 4 mg by mouth 4 (four) times daily as needed for diarrhea or loose stools.     losartan (COZAAR) 50 MG tablet Take 1.5 tablets (75 mg total) by mouth daily. 135 tablet 0   metFORMIN (GLUCOPHAGE) 500 MG tablet TAKE 1 TABLET (500 MG TOTAL) BY MOUTH 2 (TWO) TIMES DAILY WITH A MEAL. 60 tablet 1   mirtazapine (REMERON) 30 MG tablet TAKE 1 TABLET (30 MG TOTAL) BY MOUTH AT BEDTIME. 30 tablet 3   montelukast (SINGULAIR) 10 MG tablet Take 1 tablet (10 mg total) by mouth at bedtime. 90 tablet 0   nebivolol (BYSTOLIC) 10 MG tablet Take 1 tablet (10 mg total) by mouth daily. 90 tablet 0   omeprazole (PRILOSEC) 40 MG capsule Take 1 capsule (40 mg total) by mouth 2 (two) times daily before a meal. 60 capsule 5   sodium chloride (OCEAN) 0.65 % SOLN nasal spray Place 1 spray into both nostrils as needed for  congestion.     Spacer/Aero-Holding Chambers (AEROCHAMBER MV)  inhaler Use as instructed 1 each 1   ticagrelor (BRILINTA) 60 MG TABS tablet TAKE 1 TABLET (60 MG TOTAL) BY MOUTH 2 (TWO) TIMES DAILY. 60 tablet 52   TRUEPLUS LANCETS 28G MISC 28 g by Does not apply route QID. 120 each 2   nitroGLYCERIN (NITROSTAT) 0.4 MG SL tablet Place 1 tablet (0.4 mg total) under the tongue every 5 (five) minutes as needed for chest pain. 25 tablet 3   No current facility-administered medications for this visit.     Review of Systems    She denies chest pain, palpitations, dyspnea, pnd, orthopnea, n, v, dizziness, syncope, edema, weight gain, or early satiety. All other systems reviewed and are otherwise negative except as noted above.   Physical Exam    VS:  BP 114/62   Pulse 68   Ht 5' 1.5" (1.562 m)   Wt 147 lb (66.7 kg)   SpO2 92%   BMI 27.33 kg/m   GEN: Well nourished, well developed, in no acute distress. HEENT: normal. Neck: Supple, no JVD, carotid bruits, or masses. Cardiac: RRR, no murmurs, rubs, or gallops. No clubbing, cyanosis, edema.  Radials/DP/PT 2+ and equal bilaterally.  Respiratory:  Respirations regular and unlabored, clear to auscultation bilaterally. GI: Soft, nontender, nondistended, BS + x 4. MS: no deformity or atrophy. Skin: warm and dry, no rash. Neuro:  Strength and sensation are intact. Psych: Normal affect.  Accessory Clinical Findings    ECG personally reviewed by me today - No EKG in office today.     Lab Results  Component Value Date   WBC 9.4 08/21/2022   HGB 14.9 08/21/2022   HCT 44.8 08/21/2022   MCV 97.0 08/21/2022   PLT 418 (H) 08/21/2022   Lab Results  Component Value Date   CREATININE 0.88 08/21/2022   BUN 14 08/21/2022   NA 140 08/21/2022   K 3.9 08/21/2022   CL 105 08/21/2022   CO2 28 08/21/2022   Lab Results  Component Value Date   ALT 13 08/21/2022   AST 13 (L) 08/21/2022   ALKPHOS 98 08/21/2022   BILITOT 0.3 08/21/2022   Lab  Results  Component Value Date   CHOL 229 (H) 03/21/2022   HDL 76 03/21/2022   LDLCALC 136 (H) 03/21/2022   TRIG 99 03/21/2022   CHOLHDL 3.0 03/21/2022    Lab Results  Component Value Date   HGBA1C 5.3 12/18/2021    Assessment & Plan    1. CAD: S/p BMS-RCA, PTCA-LAD/Diagonal in 2001, DES x 2 overlapping-RCA in 2011. Cath in 2018 showed CTO of RCA (ISR) left to right collaterals, diffuse 50% narrowing of her proximal LAD, diffuse 40 to 50% ISR in the first diagonal, 70% mid OM2 stenosis, 50% proximal OM3 stenosis, EF 65%.  Medical management was recommended. Stable with no anginal symptoms. No indication for ischemic evaluation.  Aspirin, Brilinta, amlodipine, losartan, nebivolol, Lasix, and Lipitor.  2. Hypertension: BP well controlled. Continue current antihypertensive regimen.   3. Hyperlipidemia: LDL was 136 in 03/2021.  Patient was not taking atorvastatin at the time.  She is due for repeat lipids.  She notes she will have her labs checked with her PCP.  Continue Lipitor.  4. Tobacco use: She continues to smoke.  Full cessation advised.  5. COPD: She has stable chronic dyspnea, unchanged from prior visits.  6. Disposition: Follow-up in 1 year (patient declined sooner appointment).     Lenna Sciara, NP 09/18/2022, 2:46 PM

## 2022-09-18 NOTE — Patient Instructions (Signed)
Medication Instructions:  Your physician recommends that you continue on your current medications as directed. Please refer to the Current Medication list given to you today.  *If you need a refill on your cardiac medications before your next appointment, please call your pharmacy*   Lab Work: NONE ordered at this time of appointment   If you have labs (blood work) drawn today and your tests are completely normal, you will receive your results only by: [place] (if you have MyChart) OR A paper copy in the mail If you have any lab test that is abnormal or we need to change your treatment, we will call you to review the results.   Testing/Procedures: NONE ordered at this time of appointment     Follow-Up: At [facility], you and your health needs are our priority.  As part of our continuing mission to provide you with exceptional heart care, we have created designated Provider Care Teams.  These Care Teams include your primary Cardiologist (physician) and Advanced Practice Providers (APPs -  Physician Assistants and Nurse Practitioners) who all work together to provide you with the care you need, when you need it.  We recommend signing up for the patient portal called "MyChart".  Sign up information is provided on this After Visit Summary.  MyChart is used to connect with patients for Virtual Visits (Telemedicine).  Patients are able to view lab/test results, encounter notes, upcoming appointments, etc.  Non-urgent messages can be sent to your provider as well.   To learn more about what you can do with MyChart, go to [email or web address].    Your next appointment:   1 year(s)  Provider:   Shelva Majestic, MD     Other Instructions

## 2022-09-19 ENCOUNTER — Other Ambulatory Visit: Payer: Self-pay

## 2022-09-19 ENCOUNTER — Encounter: Payer: Self-pay | Admitting: Hematology

## 2022-09-20 ENCOUNTER — Encounter (HOSPITAL_COMMUNITY): Payer: Self-pay | Admitting: Physician Assistant

## 2022-09-20 ENCOUNTER — Other Ambulatory Visit: Payer: Self-pay

## 2022-09-20 ENCOUNTER — Telehealth (INDEPENDENT_AMBULATORY_CARE_PROVIDER_SITE_OTHER): Payer: Medicare Other | Admitting: Physician Assistant

## 2022-09-20 DIAGNOSIS — F3342 Major depressive disorder, recurrent, in full remission: Secondary | ICD-10-CM

## 2022-09-20 DIAGNOSIS — G4700 Insomnia, unspecified: Secondary | ICD-10-CM

## 2022-09-20 DIAGNOSIS — F411 Generalized anxiety disorder: Secondary | ICD-10-CM | POA: Diagnosis not present

## 2022-09-20 MED ORDER — BUSPIRONE HCL 15 MG PO TABS
ORAL_TABLET | ORAL | 3 refills | Status: DC
  Filled 2022-09-20: qty 120, 30d supply, fill #0
  Filled 2022-11-03: qty 120, 30d supply, fill #1

## 2022-09-20 MED ORDER — ESCITALOPRAM OXALATE 20 MG PO TABS
20.0000 mg | ORAL_TABLET | Freq: Every day | ORAL | 3 refills | Status: DC
  Filled 2022-09-20: qty 30, 30d supply, fill #0
  Filled 2022-11-13: qty 30, 30d supply, fill #1

## 2022-09-20 MED ORDER — CLONAZEPAM 0.5 MG PO TABS: 0.5000 mg | ORAL_TABLET | Freq: Two times a day (BID) | ORAL | 1 refills | Status: DC | PRN

## 2022-09-20 MED ORDER — DOXEPIN HCL 75 MG PO CAPS
75.0000 mg | ORAL_CAPSULE | Freq: Every day | ORAL | 3 refills | Status: DC
  Filled 2022-09-20: qty 30, 30d supply, fill #0
  Filled 2022-11-03: qty 30, 30d supply, fill #1

## 2022-09-20 MED ORDER — MIRTAZAPINE 30 MG PO TABS
30.0000 mg | ORAL_TABLET | Freq: Every day | ORAL | 3 refills | Status: DC
  Filled 2022-09-20: qty 30, 30d supply, fill #0
  Filled 2022-11-03: qty 30, 30d supply, fill #1

## 2022-09-20 NOTE — Progress Notes (Unsigned)
BH MD/PA/NP OP Progress Note  Virtual Visit via Video Note  I connected with [name] [name] on 09/20/22 at  3:00 PM EST by a video enabled telemedicine application and verified that I am speaking with the correct person using two identifiers.  Location: Patient: Home Provider: Clinic   I discussed the limitations of evaluation and management by telemedicine and the availability of in person appointments. The patient expressed understanding and agreed to proceed.  Follow Up Instructions:   I discussed the assessment and treatment plan with the patient. The patient was provided an opportunity to ask questions and all were answered. The patient agreed with the plan and demonstrated an understanding of the instructions.   The patient was advised to call back or seek an in-person evaluation if the symptoms worsen or if the condition fails to improve as anticipated.  I provided 18 minutes of non-face-to-face time during this encounter.  [name] Mood, PA   09/20/2022 6:31 PM [name] [name]  MRN:  [id number]  Chief Complaint:  Chief Complaint  Patient presents with   Follow-up   Medication Refill   HPI:   [name] [name] is a 67 year old female with a past psychiatric history significant for insomnia, generalized anxiety disorder, and major depressive disorder who presents to [facility] via virtual video visit for follow-up and medication management.  Patient is currently being managed on the following medications:  Doxepin (Sinequan) 50 mg at bedtime Buspirone 15 mg 3 times daily in the morning/1 tablet at noon/1 tablet in the evening  Escitalopram 30 mg daily Clonazepam 0.5 mg 2 times daily as needed Mirtazapine 30 mg at bedtime  Patient reports no issues or concerns regarding her current medication regimen.  Patient denies the need for dosage adjustments at this time and is requesting refills on all of her medications following the  conclusion of the encounter.  Patient denies experiencing depression.  Patient states that her anxiety is manageable through the use of her medications and reports experiencing no panic attacks.  Patient rates her anxiety at 2 out of 10 and denies any new stressors at this time.  While reviewing patient's medications, provider and talked with the patient is currently taking 30 mg of escitalopram.  Patient was informed that the max therapeutic dose for escitalopram is 20 mg.  Provider recommended lowering the dose of escitalopram from 30 mg to 20 mg daily for the management of patient's depressive symptoms, anxiety, and panic attacks.  Patient was agreeable to recommendation.  A GAD-7 screen was performed with the patient scoring an 8.  Patient is alert and oriented x4, pleasant, calm, cooperative, and fully engaged in conversation during the encounter.  Patient endorses good [name].  Patient denies suicidal or homicidal ideations.  She further denies auditory or visual hallucinations and does not appear to be responding to internal/external stimuli.  Patient endorses good sleep and receives on average 8 hours per night.  Patient endorses normal appetite and eats on average 1 meal per day.  Patient denies alcohol consumption.  Patient endorses tobacco use and smokes on average a pack per day.  Patient denies illicit drug use.  Visit Diagnosis:    ICD-10-CM   1. GAD (generalized anxiety disorder)  F41.1 busPIRone (BUSPAR) 15 MG tablet    escitalopram (LEXAPRO) 20 MG tablet    clonazePAM (KLONOPIN) 0.5 MG tablet    2. Major depressive disorder, recurrent, in full remission with anxious distress (HCC)  F33.42 busPIRone (BUSPAR) 15  MG tablet    escitalopram (LEXAPRO) 20 MG tablet    doxepin (SINEQUAN) 75 MG capsule    3. Insomnia, unspecified type  G47.00 mirtazapine (REMERON) 30 MG tablet      Past Psychiatric History:  Insomnia Major depressive disorder Generalized anxiety disorder  Past Medical  History:  Past Medical History:  Diagnosis Date   Allergy    seasonal   Anemia    Anxiety    takes Lexapro daily   Arthritis    "back, from neck down pass my bra area" (03/25/2017)   Asthma    Bartholin gland cyst 08/29/2011   Bruises easily    pt is on Effient   Chronic back pain    herniated nucleus pulposus   Chronic back pain    "neck to bra area; lower back" (03/25/2017)   Chronic kidney disease    recurrent UTI's this year 2022   COPD (chronic obstructive pulmonary disease) (HCC)    early stages   Coronary artery disease    Depression    takes Klonopin daily   Diabetes mellitus without complication ([place])    Diverticulosis    Fibroadenoma of left breast    GERD (gastroesophageal reflux disease)    takes Nexium daily   H/O hiatal hernia    Heart attack ([place]) 2011   Hemorrhoids    Hernia    Hyperlipidemia    takes Lipitor daily   Hypertension    takes Losartan daily and Labetalol bid   Hypothyroidism    takes Synthroid daily   Insomnia    hydroxyzine prn   Joint pain    Pneumonia    "couple times" (03/25/2017)   Pre-diabetes    "just found out 1 wk ago" (03/25/2017)   Psoriasis    elbows,knees,back   Shortness of breath    with exertion   Slowing of urinary stream    Stress incontinence     Past Surgical History:  Procedure Laterality Date   [place]   "left one of my ovaries"   BACK SURGERY     BIOPSY  05/27/2019   Procedure: BIOPSY;  Surgeon: Daneil Dolin, MD;  [facility];  Service: Endoscopy;;   COLONOSCOPY  2011   Dr. Ardis Hughs: Mild diverticulosis, descending diminutive colon polyp (not retrieved), next colonoscopy 10 years   CORONARY ANGIOPLASTY WITH STENT PLACEMENT  2011 X2   "regular stents didn't work; had to go back in in ~ 1 month and put in medicated stents"   DILATION AND CURETTAGE OF UTERUS     ESOPHAGOGASTRODUODENOSCOPY     ESOPHAGOGASTRODUODENOSCOPY (EGD) WITH  PROPOFOL N/A 05/27/2019   normal esophagus, dilation, erosive gastropathy s/p biopsy, normal duodenum. Negative H.pylori.    LEFT HEART CATH AND CORONARY ANGIOGRAPHY N/A 03/26/2017   Procedure: LEFT HEART CATH AND CORONARY ANGIOGRAPHY;  Surgeon: Belva Crome, MD;  Location: [place] CV LAB;  Service: Cardiovascular;  Laterality: N/A;   LUMBAR LAMINECTOMY/DECOMPRESSION MICRODISCECTOMY  08/05/2011   Procedure: LUMBAR LAMINECTOMY/DECOMPRESSION MICRODISCECTOMY;  Surgeon: Otilio Connors, MD;  Location: [place] [facility] OR[facility];  Service: Neurosurgery;  Laterality: Right;  Right Lumbar four-five extraforaminal discectomy   MALONEY DILATION N/A 05/27/2019   Procedure: Venia Minks DILATION;  Surgeon: Daneil Dolin, MD;  [facility];  Service: Endoscopy;  Laterality: N/A;  54   SHOULDER ARTHROSCOPY WITH ROTATOR CUFF REPAIR AND OPEN BICEPS TENODESIS Right 12/24/2021   Procedure: RIGHT SHOULDER ARTHROSCOPY WITH ROTATOR  CUFF REPAIR AND BICEPS TENODESIS;  Surgeon: Marybelle Killings, MD;  Location: [place];  Service: Orthopedics;  Laterality: Right;   TONSILLECTOMY     as a child   UPPER GASTROINTESTINAL ENDOSCOPY      Family Psychiatric History:  See intake H&P for full details. Reviewed, with no updates at this time.  Family History:  Family History  Problem Relation Age of Onset   Heart disease Mother    Hypertension Mother    Stroke Mother    Mental illness Mother    Heart disease Father    Hypertension Father    Diabetes Father    Colon polyps Brother    Heart disease Maternal Grandfather    Cancer Paternal Grandmother        mouth   Anesthesia problems Daughter    Breast cancer Maternal Aunt    Throat cancer Maternal Uncle    Thyroid disease Paternal Aunt    Hypotension Neg Hx    Malignant hyperthermia Neg Hx    Pseudochol deficiency Neg Hx    Colon cancer Neg Hx    Stomach cancer Neg Hx    Esophageal cancer Neg Hx    Pancreatic cancer Neg Hx    Rectal cancer Neg Hx     Social  History:  Social History   Socioeconomic History   Marital status: Divorced    Spouse name: Not on file   Number of children: 1   Years of education: Not on file   Highest education level: 9th grade  Occupational History   Not on file  Tobacco Use   Smoking status: Every Day    Packs/day: 0.50    Years: 50.00    Total pack years: 25.00    Types: Cigarettes   Smokeless tobacco: Never  Vaping Use   Vaping Use: Some days   Substances: Nicotine  Substance and Sexual Activity   Alcohol use: Yes    Comment: once a week   Drug use: Yes    Types: Marijuana    Comment: history of cocaine use, been about a year, per patient (12/18/21)   Sexual activity: Not Currently    Birth control/protection: Surgical  Other Topics Concern   Not on file  Social History Narrative   Not on file   Social Determinants of Health   Financial Resource Strain: Low Risk  (06/09/2018)   Overall Financial Resource Strain (CARDIA)    Difficulty of Paying Living Expenses: Not hard at all  Food Insecurity: Food Insecurity Present (12/20/2021)   Hunger Vital Sign    Worried About Running Out of Food in the Last Year: Sometimes true    Ran Out of Food in the Last Year: Sometimes true  Transportation Needs: Unmet Transportation Needs (12/20/2021)   PRAPARE - Hydrologist (Medical): Yes    Lack of Transportation (Non-Medical): No  Physical Activity: Inactive (12/20/2021)   Exercise Vital Sign    Days of Exercise per Week: 0 days    Minutes of Exercise per Session: 0 min  Stress: Stress Concern Present (12/20/2021)   [place]    Feeling of Stress : To some extent  Social Connections: Moderately Isolated (12/20/2021)   Social Connection and Isolation Panel [NHANES]    Frequency of Communication with Friends and Family: More than three times a week    Frequency of Social Gatherings with Friends and Family: More than three  times a week  Attends Religious Services: 1 to 4 times per year    Active Member of Clubs or Organizations: No    Attends Archivist Meetings: Never    Marital Status: Divorced    Allergies:  Allergies  Allergen Reactions   Avocado Anaphylaxis   Latex Shortness Of Breath and Rash   Codeine Nausea Only    Metabolic Disorder Labs: Lab Results  Component Value Date   HGBA1C 5.3 12/18/2021   MPG 105.41 12/18/2021   MPG 119.76 10/05/2021   No results found for: "PROLACTIN" Lab Results  Component Value Date   CHOL 229 (H) 03/21/2022   TRIG 99 03/21/2022   HDL 76 03/21/2022   CHOLHDL 3.0 03/21/2022   VLDL 15 04/16/2016   LDLCALC 136 (H) 03/21/2022   LDLCALC 69 11/24/2020   Lab Results  Component Value Date   TSH 3.180 09/10/2022   TSH 0.502 10/25/2021    Therapeutic Level Labs: No results found for: "LITHIUM" No results found for: "VALPROATE" No results found for: "CBMZ"  Current Medications: Current Outpatient Medications  Medication Sig Dispense Refill   albuterol (VENTOLIN HFA) 108 (90 Base) MCG/ACT inhaler Inhale 2 puffs into the lungs every 6 (six) hours as needed for wheezing or shortness of breath. 18 g 0   albuterol (VENTOLIN HFA) 108 (90 Base) MCG/ACT inhaler INHALE 2 PUFFS INTO THE LUNGS EVERY 6 (SIX) HOURS AS NEEDED FOR WHEEZING OR SHORTNESS OF BREATH. 18 g 0   amLODipine (NORVASC) 5 MG tablet Take 1 tablet (5 mg total) by mouth daily. 90 tablet 3   aspirin EC 81 MG tablet Take 81 mg by mouth daily.     atorvastatin (LIPITOR) 80 MG tablet Take 1 tablet (80 mg total) by mouth daily, please schedule appointment for more refills 90 tablet 3   benzonatate (TESSALON) 200 MG capsule Take 1 capsule (200 mg total) by mouth 2 (two) times daily as needed for cough. 20 capsule 0   Blood Glucose Monitoring Suppl (TRUE METRIX METER) DEVI 1 kit by Does not apply route 4 (four) times daily. 1 Device 0   busPIRone (BUSPAR) 15 MG tablet Take 2 tablets (30 mg  total) by mouth in the morning AND 1 tablet (15 mg total) daily at 12 noon AND 1 tablet (15 mg total) every evening. 120 tablet 3   clonazePAM (KLONOPIN) 0.5 MG tablet Take 1 tablet (0.5 mg total) by mouth 2 (two) times daily as needed for anxiety. 60 tablet 1   doxepin (SINEQUAN) 75 MG capsule Take 1 capsule (75 mg total) by mouth at bedtime. 30 capsule 3   escitalopram (LEXAPRO) 20 MG tablet Take 1 tablet (20 mg total) by mouth daily. 30 tablet 3   fluticasone (FLONASE) 50 MCG/ACT nasal spray Place 2 sprays into both nostrils daily. 16 g 0   Fluticasone-Umeclidin-Vilant (TRELEGY ELLIPTA) 100-62.5-25 MCG/ACT AEPB Inhale 1 puff into the lungs daily. 60 each 6   furosemide (LASIX) 20 MG tablet Take 1 tablet (20 mg total) by mouth daily. 90 tablet 3   gabapentin (NEURONTIN) 600 MG tablet TAKE 1 TABLET BY MOUTH 4 TIMES DAILY 360 tablet 1   glucose blood (TRUE METRIX BLOOD GLUCOSE TEST) test strip Use as instructed 100 each 12   ipratropium-albuterol (DUONEB) 0.5-2.5 (3) MG/3ML SOLN TAKE 3 MLS VIA NEBULIZATION EVERY 6 HOURS 360 mL 1   levothyroxine (SYNTHROID) 112 MCG tablet Take 1 tablet (112 mcg total) by mouth daily. 90 tablet 0   loperamide (IMODIUM A-D) 2 MG tablet Take  4 mg by mouth 4 (four) times daily as needed for diarrhea or loose stools.     losartan (COZAAR) 50 MG tablet Take 1.5 tablets (75 mg total) by mouth daily. 135 tablet 3   metFORMIN (GLUCOPHAGE) 500 MG tablet TAKE 1 TABLET (500 MG TOTAL) BY MOUTH 2 (TWO) TIMES DAILY WITH A MEAL. 60 tablet 1   mirtazapine (REMERON) 30 MG tablet Take 1 tablet (30 mg total) by mouth at bedtime. 30 tablet 3   montelukast (SINGULAIR) 10 MG tablet Take 1 tablet (10 mg total) by mouth at bedtime. 90 tablet 0   nebivolol (BYSTOLIC) 10 MG tablet Take 1 tablet (10 mg total) by mouth daily. 90 tablet 3   nitroGLYCERIN (NITROSTAT) 0.4 MG SL tablet Place 1 tablet (0.4 mg total) under the tongue every 5 (five) minutes as needed for chest pain. 25 tablet 3    omeprazole (PRILOSEC) 40 MG capsule Take 1 capsule (40 mg total) by mouth 2 (two) times daily before a meal. 60 capsule 5   sodium chloride (OCEAN) 0.65 % SOLN nasal spray Place 1 spray into both nostrils as needed for congestion.     Spacer/Aero-Holding Chambers (AEROCHAMBER MV) inhaler Use as instructed 1 each 1   ticagrelor (BRILINTA) 60 MG TABS tablet Take 1 tablet (60 mg total) by mouth 2 (two) times daily. 60 tablet 52   TRUEPLUS LANCETS 28G MISC 28 g by Does not apply route QID. 120 each 2   No current facility-administered medications for this visit.     Musculoskeletal: Strength & Muscle Tone: Unable to assess due to telemedicine visit [place]: Unable to assess due to telemedicine visit Patient leans: Unable to assess due to telemedicine visit  Psychiatric Specialty Exam: Review of Systems  Psychiatric/Behavioral:  Negative for decreased concentration, dysphoric [name], hallucinations, self-injury, sleep disturbance and suicidal ideas. The patient is nervous/anxious. The patient is not hyperactive.     There were no vitals taken for this visit.There is no height or weight on file to calculate BMI.  General Appearance: Casual  Eye Contact:  Good  Speech:  Clear and Coherent and Normal Rate  Volume:  Normal  [name]:  Euthymic  Affect:  Appropriate  Thought Process:  Coherent, Goal Directed, and Descriptions of Associations: Intact  Orientation:  Full (Time, Place, and Person)  Thought Content: WDL   Suicidal Thoughts:  No  Homicidal Thoughts:  No  Memory:  Immediate;   Good Recent;   Good Remote;   Good  Judgement:  Fair  Insight:  Fair  Psychomotor Activity:  Normal  Concentration:  Concentration: Good and Attention Span: Good  Recall:  [employer] of Knowledge: Fair  Language: Fair  Akathisia:  Negative  Handed:  Right  AIMS (if indicated): not done  Assets:  Communication Skills Desire for Improvement Housing  ADL's:  Intact  Cognition: WNL  Sleep:  Good    Screenings: AUDIT    Flowsheet Row Admission (Discharged) from 07/10/2013 in [facility]  Alcohol Use Disorder Identification Test Final Score (AUDIT) 12      GAD-7    Flowsheet Row Video Visit from 09/20/2022 in [facility] Office Visit from 09/10/2022 in [place] Office Visit from 08/02/2022 in [place] Video Visit from 02/28/2022 in [facility] Video Visit from 11/29/2021 in [facility]  Total GAD-7 Score 8 0 1 3  7      PHQ2-9    Flowsheet Row Video Visit from 09/20/2022 in [facility] Office Visit from 09/10/2022 in [place] Office Visit from 08/02/2022 in [place] Video Visit from 02/28/2022 in [place] from 12/20/2021 in [facility]  PHQ-2 Total Score 1 0 2 0 0  PHQ-9 Total Score -- 1 4 -- --      Flowsheet Row Video Visit from 09/20/2022 in [facility] ED from 09/04/2022 in [facility] ED from 04/26/2022 in [facility] [place] No Risk No Risk No Risk        Assessment and Plan:   [name] [name] is a 67 year old female with a past psychiatric history significant for insomnia, generalized anxiety disorder, and major depressive disorder who presents to [facility] via virtual video visit for follow-up and medication management.  Patient denies experiencing depression or anxiety through the use of her current medication regimen.  Patient denies experiencing any adverse side effects and would like to continue taking her medications as prescribed.  Upon review, the patient is currently taking  30 mg of escitalopram.  Provider recommended adjusting patient's escitalopram from 30 mg to 20 mg daily since 20 mg of escitalopram is the max therapeutic dose.  Patient was agreeable to recommendation.  Patient's medications to be e-prescribed to pharmacy of choice.  Collaboration of Care: Collaboration of Care: Medication Management AEB patient's medications being managed by this provider and Psychiatrist AEB patient being managed by this behavioral health provider  Patient/Guardian was advised Release of Information must be obtained prior to any record release in order to collaborate their care with an outside provider. Patient/Guardian was advised if they have not already done so to contact the registration department to sign all necessary forms in order for [place] to release information regarding their care.   Consent: Patient/Guardian gives verbal consent for treatment and assignment of benefits for services provided during this visit. Patient/Guardian expressed understanding and agreed to proceed.   1. GAD (generalized anxiety disorder)  - busPIRone (BUSPAR) 15 MG tablet; Take 2 tablets (30 mg total) by mouth in the morning AND 1 tablet (15 mg total) daily at 12 noon AND 1 tablet (15 mg total) every evening.  Dispense: 120 tablet; Refill: 3 - escitalopram (LEXAPRO) 20 MG tablet; Take 1 tablet (20 mg total) by mouth daily.  Dispense: 30 tablet; Refill: 3 - clonazePAM (KLONOPIN) 0.5 MG tablet; Take 1 tablet (0.5 mg total) by mouth 2 (two) times daily as needed for anxiety.  Dispense: 60 tablet; Refill: 1  2. Major depressive disorder, recurrent, in full remission with anxious distress (HCC)  - busPIRone (BUSPAR) 15 MG tablet; Take 2 tablets (30 mg total) by mouth in the morning AND 1 tablet (15 mg total) daily at 12 noon AND 1 tablet (15 mg total) every evening.  Dispense: 120 tablet; Refill: 3 - escitalopram (LEXAPRO) 20 MG tablet; Take 1 tablet (20 mg total) by mouth daily.  Dispense: 30 tablet;  Refill: 3 - doxepin (SINEQUAN) 75 MG capsule; Take 1 capsule (75 mg total) by mouth at bedtime.  Dispense: 30 capsule; Refill: 3  3. Insomnia, unspecified type  - mirtazapine (REMERON) 30 MG tablet; Take 1 tablet (30 mg total) by mouth at bedtime.  Dispense: 30  tablet; Refill: 3  Patient to follow up in 2 months Provider spent a total of 18 minutes with the patient/reviewing patient's chart  [name] Mood, PA 09/20/2022, 6:31 PM

## 2022-10-14 ENCOUNTER — Other Ambulatory Visit: Payer: Self-pay

## 2022-10-14 ENCOUNTER — Other Ambulatory Visit: Payer: Self-pay | Admitting: Internal Medicine

## 2022-10-14 DIAGNOSIS — J4489 Other specified chronic obstructive pulmonary disease: Secondary | ICD-10-CM

## 2022-10-14 DIAGNOSIS — E1165 Type 2 diabetes mellitus with hyperglycemia: Secondary | ICD-10-CM

## 2022-10-14 MED ORDER — METFORMIN HCL 500 MG PO TABS
ORAL_TABLET | Freq: Two times a day (BID) | ORAL | 1 refills | Status: DC
  Filled 2022-10-14: qty 180, 90d supply, fill #0
  Filled 2023-01-14: qty 180, 90d supply, fill #1

## 2022-10-14 MED ORDER — ALBUTEROL SULFATE HFA 108 (90 BASE) MCG/ACT IN AERS
INHALATION_SPRAY | RESPIRATORY_TRACT | 12 refills | Status: DC
  Filled 2022-10-14 – 2022-10-18 (×2): qty 18, 25d supply, fill #0

## 2022-10-15 ENCOUNTER — Encounter: Payer: Self-pay | Admitting: Hematology

## 2022-10-15 ENCOUNTER — Other Ambulatory Visit: Payer: Self-pay

## 2022-10-16 ENCOUNTER — Encounter: Payer: Self-pay | Admitting: Internal Medicine

## 2022-10-16 DIAGNOSIS — J449 Chronic obstructive pulmonary disease, unspecified: Secondary | ICD-10-CM

## 2022-10-18 ENCOUNTER — Other Ambulatory Visit: Payer: Self-pay

## 2022-10-22 ENCOUNTER — Encounter: Payer: Self-pay | Admitting: Gastroenterology

## 2022-10-22 ENCOUNTER — Ambulatory Visit (INDEPENDENT_AMBULATORY_CARE_PROVIDER_SITE_OTHER): Payer: Medicare Other | Admitting: Gastroenterology

## 2022-10-22 VITALS — BP 122/70 | HR 63 | Ht 61.5 in | Wt 150.6 lb

## 2022-10-22 DIAGNOSIS — D509 Iron deficiency anemia, unspecified: Secondary | ICD-10-CM | POA: Diagnosis not present

## 2022-10-22 NOTE — Progress Notes (Signed)
[facility] Progress Note  Chief Complaint: Iron deficiency anemia  Subjective  History: Patient of Dr. Christella HartiganJacobs, last seen in clinic by APP April 2023, extensive details in that note.  PCP referred at that time for concern of GI bleeding, patient reported black tarry stool while taking NSAIDs regularly as well as Brilinta, CBC was normal, ferritin 13. Had undergone EGD and colonoscopy with Dr. Christella HartiganJacobs August 2022. EGD by Dr. Christella HartiganJacobs May 2023, unrevealing for any source of blood loss. Small bowel video capsule study May 2023 (read by Dr. Myrtie Neitheranis) without source of blood loss or anemia seen. She is followed by Dr. Candise CheKale for low iron saturation without decrease in hemoglobin.  Has received some IV iron treatments.  Elevated platelets thought secondary to iron deficiency, negative for a clonal process causing thrombocytosis. Complex CAD with multiple prior interventions (outlined in cardiology note 09/18/2022)-unfortunately patient continues to smoke despite that condition and her COPD. ______________________________  Olegario MessierKathy was here with her daughter today.  She reports occasional "dark stool".  She does not see bright red blood per rectum, maroon or black tarry stool.  No longer on iron tablets but receiving periodic infusions.  ROS: Cardiovascular:  no chest pain Respiratory: Intermittent dyspnea or cough Fatigue that she feels may be related to low iron Remainder systems negative except as above  The patient's Past Medical, Family and Social History were reviewed and are on file in the EMR.  Objective:  Med list reviewed  Current Outpatient Medications:    albuterol (VENTOLIN HFA) 108 (90 Base) MCG/ACT inhaler, Inhale 2 puffs into the lungs every 6 (six) hours as needed for wheezing or shortness of breath., Disp: 18 g, Rfl: 0   albuterol (VENTOLIN HFA) 108 (90 Base) MCG/ACT inhaler, INHALE 2 PUFFS INTO THE LUNGS EVERY 6 (SIX) HOURS AS NEEDED FOR WHEEZING OR SHORTNESS OF BREATH.,  Disp: 18 g, Rfl: 12   amLODipine (NORVASC) 5 MG tablet, Take 1 tablet (5 mg total) by mouth daily., Disp: 90 tablet, Rfl: 3   aspirin EC 81 MG tablet, Take 81 mg by mouth daily., Disp: , Rfl:    atorvastatin (LIPITOR) 80 MG tablet, Take 1 tablet (80 mg total) by mouth daily, please schedule appointment for more refills, Disp: 90 tablet, Rfl: 3   benzonatate (TESSALON) 200 MG capsule, Take 1 capsule (200 mg total) by mouth 2 (two) times daily as needed for cough., Disp: 20 capsule, Rfl: 0   Blood Glucose Monitoring Suppl (TRUE METRIX METER) DEVI, 1 kit by Does not apply route 4 (four) times daily., Disp: 1 Device, Rfl: 0   busPIRone (BUSPAR) 15 MG tablet, Take 2 tablets (30 mg total) by mouth in the morning AND 1 tablet (15 mg total) daily at 12 noon AND 1 tablet (15 mg total) every evening., Disp: 120 tablet, Rfl: 3   clonazePAM (KLONOPIN) 0.5 MG tablet, Take 1 tablet (0.5 mg total) by mouth 2 (two) times daily as needed for anxiety., Disp: 60 tablet, Rfl: 1   doxepin (SINEQUAN) 75 MG capsule, Take 1 capsule (75 mg total) by mouth at bedtime., Disp: 30 capsule, Rfl: 3   escitalopram (LEXAPRO) 20 MG tablet, Take 1 tablet (20 mg total) by mouth daily., Disp: 30 tablet, Rfl: 3   fluticasone (FLONASE) 50 MCG/ACT nasal spray, Place 2 sprays into both nostrils daily., Disp: 16 g, Rfl: 0   Fluticasone-Umeclidin-Vilant (TRELEGY ELLIPTA) 100-62.5-25 MCG/ACT AEPB, Inhale 1 puff into the lungs daily., Disp: 60 each, Rfl: 6   furosemide (  LASIX) 20 MG tablet, Take 1 tablet (20 mg total) by mouth daily., Disp: 90 tablet, Rfl: 3   gabapentin (NEURONTIN) 600 MG tablet, TAKE 1 TABLET BY MOUTH 4 TIMES DAILY, Disp: 360 tablet, Rfl: 1   glucose blood (TRUE METRIX BLOOD GLUCOSE TEST) test strip, Use as instructed, Disp: 100 each, Rfl: 12   ipratropium-albuterol (DUONEB) 0.5-2.5 (3) MG/3ML SOLN, TAKE 3 MLS VIA NEBULIZATION EVERY 6 HOURS, Disp: 360 mL, Rfl: 1   levothyroxine (SYNTHROID) 112 MCG tablet, Take 1 tablet (112  mcg total) by mouth daily., Disp: 90 tablet, Rfl: 0   loperamide (IMODIUM A-D) 2 MG tablet, Take 4 mg by mouth 4 (four) times daily as needed for diarrhea or loose stools., Disp: , Rfl:    losartan (COZAAR) 50 MG tablet, Take 1.5 tablets (75 mg total) by mouth daily., Disp: 135 tablet, Rfl: 3   metFORMIN (GLUCOPHAGE) 500 MG tablet, TAKE 1 TABLET (500 MG TOTAL) BY MOUTH 2 (TWO) TIMES DAILY WITH A MEAL., Disp: 180 tablet, Rfl: 1   mirtazapine (REMERON) 30 MG tablet, Take 1 tablet (30 mg total) by mouth at bedtime., Disp: 30 tablet, Rfl: 3   montelukast (SINGULAIR) 10 MG tablet, Take 1 tablet (10 mg total) by mouth at bedtime., Disp: 90 tablet, Rfl: 0   nebivolol (BYSTOLIC) 10 MG tablet, Take 1 tablet (10 mg total) by mouth daily., Disp: 90 tablet, Rfl: 3   nitroGLYCERIN (NITROSTAT) 0.4 MG SL tablet, Place 1 tablet (0.4 mg total) under the tongue every 5 (five) minutes as needed for chest pain., Disp: 25 tablet, Rfl: 3   omeprazole (PRILOSEC) 40 MG capsule, Take 1 capsule (40 mg total) by mouth 2 (two) times daily before a meal., Disp: 60 capsule, Rfl: 5   sodium chloride (OCEAN) 0.65 % SOLN nasal spray, Place 1 spray into both nostrils as needed for congestion., Disp: , Rfl:    Spacer/Aero-Holding Chambers (AEROCHAMBER MV) inhaler, Use as instructed, Disp: 1 each, Rfl: 1   ticagrelor (BRILINTA) 60 MG TABS tablet, Take 1 tablet (60 mg total) by mouth 2 (two) times daily., Disp: 60 tablet, Rfl: 52   TRUEPLUS LANCETS 28G MISC, 28 g by Does not apply route QID., Disp: 120 each, Rfl: 2   Vital signs in last 24 hrs: Vitals:   10/22/22 1440  BP: 122/70  Pulse: 63  SpO2: 95%   Wt Readings from Last 3 Encounters:  10/22/22 150 lb 9.6 oz (68.3 kg)  09/18/22 147 lb (66.7 kg)  09/10/22 154 lb (69.9 kg)    Physical Exam  Gravelly vocal quality, smells of cigarettes HEENT: sclera anicteric, oral mucosa moist without lesions Neck: supple, no thyromegaly, JVD or lymphadenopathy Cardiac: Regular without  appreciable murmur,  no peripheral edema Pulm: clear to auscultation bilaterally, normal RR and effort noted Abdomen: soft, mild epigastric abdominal wall tenderness (reports that area has been tender for a long time), with active bowel sounds. No guarding or palpable hepatosplenomegaly. Skin; warm and dry, no jaundice or rash  Labs:     Latest Ref Rng & Units 08/21/2022    1:15 PM 02/18/2022    1:51 PM 11/28/2021    4:45 PM  CBC  WBC 4.0 - 10.5 K/uL 9.4  5.8  7.1   Hemoglobin 12.0 - 15.0 g/dL 65.9  93.5  70.1   Hematocrit 36.0 - 46.0 % 44.8  46.7  38.3   Platelets 150 - 400 K/uL 418  515  429.0    Iron/TIBC/Ferritin/ %Sat    Component  Value Date/Time   IRON 59 08/21/2022 1315   TIBC 402 08/21/2022 1315   FERRITIN 12 08/21/2022 1315   IRONPCTSAT 15 08/21/2022 1315    ___________________________________________ Radiologic studies:   ____________________________________________ Other:   _____________________________________________ Assessment & Plan  Assessment: Encounter Diagnosis  Name Primary?   Iron deficiency anemia, unspecified iron deficiency anemia type Yes   Iron deficiency with normal hemoglobin require periodic IV iron treatments.  We discussed her prior endoscopic reports and the small bowel video capsule study that I read.  I certainly understand the concern and request for reevaluation in a patient with persistent iron deficiency.  At this point, we have not identified any discernible source of occult GI blood loss.  That is either because she does not have GI blood loss, and thus may have a nonspecific iron malabsorption problem, or that she does have and not identified source of occult GI blood loss in the small bowel such as small ulcer or AVM.  Video capsule study is generally very good but has its inherent limitations for visualizing the small bowel mucosa.  Even if this is the latter scenario, there is no further testing that might localize that and be  therapeutic.  Therefore, she needs close monitoring of hemoglobin and iron levels with IV iron treatments as she has been receiving thus far.  If she should develop passage of black tarry stool, that should be immediately brought to medical attention. Lanett will otherwise come to see me (or Dr. Christella Hartigan if he is able to return to work) as needed or when her next scheduled routine colonoscopy would be due.    Charlie Pitter III

## 2022-10-22 NOTE — Patient Instructions (Signed)
_______________________________________________________  If your blood pressure at your visit was 140/90 or greater, please contact your primary care physician to follow up on this.  _______________________________________________________  If you are age 67 or older, your body mass index should be between 23-30. Your Body mass index is 27.99 kg/m. If this is out of the aforementioned range listed, please consider follow up with your Primary Care Provider.  If you are age 64 or younger, your body mass index should be between 19-25. Your Body mass index is 27.99 kg/m. If this is out of the aformentioned range listed, please consider follow up with your Primary Care Provider.   ________________________________________________________  The [facility] providers would like to encourage you to use MYCHART to communicate with providers for non-urgent requests or questions.  Due to long hold times on the telephone, sending your provider a message by MYCHART may be a faster and more efficient way to get a response.  Please allow 48 business hours for a response.  Please remember that this is for non-urgent requests.  _______________________________________________________  It was a pleasure to see you today!  Thank you for trusting me with your gastrointestinal care!     

## 2022-11-11 ENCOUNTER — Other Ambulatory Visit: Payer: Self-pay

## 2022-11-14 ENCOUNTER — Other Ambulatory Visit: Payer: Self-pay

## 2022-11-14 ENCOUNTER — Encounter: Payer: Self-pay | Admitting: Hematology

## 2022-11-18 ENCOUNTER — Other Ambulatory Visit: Payer: Self-pay

## 2022-11-19 ENCOUNTER — Other Ambulatory Visit: Payer: Self-pay

## 2022-11-19 DIAGNOSIS — D509 Iron deficiency anemia, unspecified: Secondary | ICD-10-CM

## 2022-11-20 ENCOUNTER — Other Ambulatory Visit: Payer: Self-pay

## 2022-11-20 ENCOUNTER — Inpatient Hospital Stay (HOSPITAL_BASED_OUTPATIENT_CLINIC_OR_DEPARTMENT_OTHER): Payer: Medicare Other | Admitting: Hematology

## 2022-11-20 ENCOUNTER — Inpatient Hospital Stay: Payer: Medicare Other | Attending: Hematology

## 2022-11-20 VITALS — BP 134/80 | HR 82 | Temp 98.7°F | Resp 15 | Wt 147.4 lb

## 2022-11-20 DIAGNOSIS — E785 Hyperlipidemia, unspecified: Secondary | ICD-10-CM | POA: Insufficient documentation

## 2022-11-20 DIAGNOSIS — Z87442 Personal history of urinary calculi: Secondary | ICD-10-CM | POA: Insufficient documentation

## 2022-11-20 DIAGNOSIS — R1010 Upper abdominal pain, unspecified: Secondary | ICD-10-CM | POA: Diagnosis not present

## 2022-11-20 DIAGNOSIS — F17210 Nicotine dependence, cigarettes, uncomplicated: Secondary | ICD-10-CM | POA: Insufficient documentation

## 2022-11-20 DIAGNOSIS — D509 Iron deficiency anemia, unspecified: Secondary | ICD-10-CM

## 2022-11-20 DIAGNOSIS — Z7902 Long term (current) use of antithrombotics/antiplatelets: Secondary | ICD-10-CM | POA: Diagnosis not present

## 2022-11-20 DIAGNOSIS — E119 Type 2 diabetes mellitus without complications: Secondary | ICD-10-CM | POA: Insufficient documentation

## 2022-11-20 DIAGNOSIS — K219 Gastro-esophageal reflux disease without esophagitis: Secondary | ICD-10-CM | POA: Diagnosis not present

## 2022-11-20 DIAGNOSIS — I2510 Atherosclerotic heart disease of native coronary artery without angina pectoris: Secondary | ICD-10-CM | POA: Diagnosis not present

## 2022-11-20 DIAGNOSIS — I10 Essential (primary) hypertension: Secondary | ICD-10-CM | POA: Diagnosis not present

## 2022-11-20 DIAGNOSIS — I252 Old myocardial infarction: Secondary | ICD-10-CM | POA: Insufficient documentation

## 2022-11-20 DIAGNOSIS — N189 Chronic kidney disease, unspecified: Secondary | ICD-10-CM | POA: Diagnosis not present

## 2022-11-20 DIAGNOSIS — Z79890 Hormone replacement therapy (postmenopausal): Secondary | ICD-10-CM | POA: Diagnosis not present

## 2022-11-20 DIAGNOSIS — D75839 Thrombocytosis, unspecified: Secondary | ICD-10-CM | POA: Diagnosis not present

## 2022-11-20 DIAGNOSIS — E039 Hypothyroidism, unspecified: Secondary | ICD-10-CM | POA: Diagnosis not present

## 2022-11-20 DIAGNOSIS — Z801 Family history of malignant neoplasm of trachea, bronchus and lung: Secondary | ICD-10-CM | POA: Diagnosis not present

## 2022-11-20 DIAGNOSIS — Z7982 Long term (current) use of aspirin: Secondary | ICD-10-CM | POA: Insufficient documentation

## 2022-11-20 DIAGNOSIS — Z803 Family history of malignant neoplasm of breast: Secondary | ICD-10-CM | POA: Diagnosis not present

## 2022-11-20 LAB — CBC WITH DIFFERENTIAL (CANCER CENTER ONLY)
Abs Immature Granulocytes: 0.02 10*3/uL (ref 0.00–0.07)
Basophils Absolute: 0.1 10*3/uL (ref 0.0–0.1)
Basophils Relative: 1 %
Eosinophils Absolute: 0.1 10*3/uL (ref 0.0–0.5)
Eosinophils Relative: 1 %
HCT: 46.6 % — ABNORMAL HIGH (ref 36.0–46.0)
Hemoglobin: 15.7 g/dL — ABNORMAL HIGH (ref 12.0–15.0)
Immature Granulocytes: 0 %
Lymphocytes Relative: 20 %
Lymphs Abs: 2 10*3/uL (ref 0.7–4.0)
MCH: 32.2 pg (ref 26.0–34.0)
MCHC: 33.7 g/dL (ref 30.0–36.0)
MCV: 95.7 fL (ref 80.0–100.0)
Monocytes Absolute: 0.9 10*3/uL (ref 0.1–1.0)
Monocytes Relative: 9 %
Neutro Abs: 7 10*3/uL (ref 1.7–7.7)
Neutrophils Relative %: 69 %
Platelet Count: 412 10*3/uL — ABNORMAL HIGH (ref 150–400)
RBC: 4.87 MIL/uL (ref 3.87–5.11)
RDW: 14.6 % (ref 11.5–15.5)
WBC Count: 10 10*3/uL (ref 4.0–10.5)
nRBC: 0 % (ref 0.0–0.2)

## 2022-11-20 LAB — CMP (CANCER CENTER ONLY)
ALT: 14 U/L (ref 0–44)
AST: 13 U/L — ABNORMAL LOW (ref 15–41)
Albumin: 4.4 g/dL (ref 3.5–5.0)
Alkaline Phosphatase: 115 U/L (ref 38–126)
Anion gap: 5 (ref 5–15)
BUN: 13 mg/dL (ref 8–23)
CO2: 29 mmol/L (ref 22–32)
Calcium: 9.8 mg/dL (ref 8.9–10.3)
Chloride: 108 mmol/L (ref 98–111)
Creatinine: 0.78 mg/dL (ref 0.44–1.00)
GFR, Estimated: >60 mL/min (ref >60)
Glucose, Bld: 172 mg/dL — ABNORMAL HIGH (ref 70–99)
Potassium: 4 mmol/L (ref 3.5–5.1)
Sodium: 142 mmol/L (ref 135–145)
Total Bilirubin: 0.6 mg/dL (ref 0.3–1.2)
Total Protein: 7.2 g/dL (ref 6.5–8.1)

## 2022-11-20 LAB — IRON AND IRON BINDING CAPACITY (CC-WL,HP ONLY)
Iron: 105 ug/dL (ref 28–170)
Saturation Ratios: 29 % (ref 10.4–31.8)
TIBC: 365 ug/dL (ref 250–450)
UIBC: 260 ug/dL (ref 148–442)

## 2022-11-20 LAB — FERRITIN: Ferritin: 142 ng/mL (ref 11–307)

## 2022-11-20 NOTE — Progress Notes (Signed)
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 Patient was last seen by me on 08/21/2022 and complained of increased fatigue, occasional black stools, and left lower abdominal pain.  Today, she is accompanied by a family member. Patient tolerated last IV Iron infusion without any toxicities. She denies any improvement in energy levels. She has not been taking any oral iron or Pepto bismol.  She does report a couple occasional episodes of dark stools since her last visit and notes that she has been seen by GI for evaluation. Patient is working towards increasing her vegetable intake which she believes is helping. Patient's last capsule endoscopy was 1 year ago.  She does complain of some upper abdominal pain which is not associated with eating. She reports that she has a hernia and takes an acid suppressant twice daily to manage. She denies any back pain. Patient continues to smoke one pack a day.  MEDICAL HISTORY:  Past Medical History:  Diagnosis Date   Allergy    seasonal   Anemia    Anxiety    takes Lexapro daily   Arthritis    "back, from neck down pass my bra area" (03/25/2017)   Asthma    Bartholin gland cyst 08/29/2011   Bruises easily    pt is on Effient   Chronic back pain    herniated nucleus pulposus   Chronic back pain    "neck to bra area; lower back" (03/25/2017)   Chronic kidney disease    recurrent UTI's this year 2022   COPD (chronic  obstructive pulmonary disease) (HCC)    early stages   Coronary artery disease    Depression    takes Klonopin daily   Diabetes mellitus without complication (HCC)    Diverticulosis    Fibroadenoma of left breast    GERD (gastroesophageal reflux disease)    takes Nexium daily   H/O hiatal hernia    Heart attack (HCC) 2011   Hemorrhoids    Hernia    Hyperlipidemia    takes Lipitor daily   Hypertension    takes Losartan daily and Labetalol bid   Hypothyroidism    takes Synthroid daily   Insomnia    hydroxyzine prn   Joint pain    Pneumonia    "couple times" (03/25/2017)   Pre-diabetes    "just found out 1 wk ago" (03/25/2017)   Psoriasis    elbows,knees,back   Shortness of breath    with exertion   Slowing of urinary stream    Stress incontinence     SURGICAL HISTORY: Past Surgical History:  Procedure Laterality Date   ABDOMINAL EXPLORATION SURGERY  1977   ABDOMINAL HYSTERECTOMY  1977   "left one of my ovaries"   BACK SURGERY     BIOPSY  05/27/2019   Procedure: BIOPSY;  Surgeon: Corbin Ade, MD;  Location: [facility];  Service: Endoscopy;;   COLONOSCOPY  2011   Dr. Christella Hartigan: Mild diverticulosis, descending diminutive colon polyp (not retrieved), next colonoscopy  10 years   CORONARY ANGIOPLASTY WITH STENT PLACEMENT  2011 X2   "regular stents didn't work; had to go back in in ~ 1 month and put in medicated stents"   DILATION AND CURETTAGE OF UTERUS     ESOPHAGOGASTRODUODENOSCOPY     ESOPHAGOGASTRODUODENOSCOPY (EGD) WITH PROPOFOL N/A 05/27/2019   normal esophagus, dilation, erosive gastropathy s/p biopsy, normal duodenum. Negative H.pylori.    LEFT HEART CATH AND CORONARY ANGIOGRAPHY N/A 03/26/2017   Procedure: LEFT HEART CATH AND CORONARY ANGIOGRAPHY;  Surgeon: Lyn Records, MD;  Location: [facility];  Service: Cardiovascular;  Laterality: N/A;   LUMBAR LAMINECTOMY/DECOMPRESSION MICRODISCECTOMY  08/05/2011   Procedure: LUMBAR LAMINECTOMY/DECOMPRESSION  MICRODISCECTOMY;  Surgeon: Clydene Fake, MD;  Location: [facility];  Service: Neurosurgery;  Laterality: Right;  Right Lumbar four-five extraforaminal discectomy   MALONEY DILATION N/A 05/27/2019   Procedure: Elease Hashimoto DILATION;  Surgeon: Corbin Ade, MD;  Location: [facility];  Service: Endoscopy;  Laterality: N/A;  54   SHOULDER ARTHROSCOPY WITH ROTATOR CUFF REPAIR AND OPEN BICEPS TENODESIS Right 12/24/2021   Procedure: RIGHT SHOULDER ARTHROSCOPY WITH ROTATOR CUFF REPAIR AND BICEPS TENODESIS;  Surgeon: Eldred Manges, MD;  Location: [facility];  Service: Orthopedics;  Laterality: Right;   TONSILLECTOMY     as a child   UPPER GASTROINTESTINAL ENDOSCOPY      SOCIAL HISTORY: Social History   Socioeconomic History   Marital status: Divorced    Spouse name: Not on file   Number of children: 1   Years of education: Not on file   Highest education level: 9th grade  Occupational History   Not on file  Tobacco Use   Smoking status: Every Day    Packs/day: 0.50    Years: 50.00    Additional pack years: 0.00    Total pack years: 25.00    Types: Cigarettes   Smokeless tobacco: Never  Vaping Use   Vaping Use: Some days   Substances: Nicotine  Substance and Sexual Activity   Alcohol use: Yes    Comment: once a week   Drug use: Yes    Types: Marijuana    Comment: history of cocaine use, been about a year, per patient (12/18/21)   Sexual activity: Not Currently    Birth control/protection: Surgical  Other Topics Concern   Not on file  Social History Narrative   Not on file   Social Determinants of Health   Financial Resource Strain: Low Risk  (06/09/2018)   Overall Financial Resource Strain (CARDIA)    Difficulty of Paying Living Expenses: Not hard at all  Food Insecurity: Food Insecurity Present (12/20/2021)   Hunger Vital Sign    Worried About Running Out of Food in the Last Year: Sometimes true    Ran Out of Food in the Last Year: Sometimes true  Transportation Needs: Unmet  Transportation Needs (12/20/2021)   PRAPARE - Administrator, Civil Service (Medical): Yes    Lack of Transportation (Non-Medical): No  Physical Activity: Inactive (12/20/2021)   Exercise Vital Sign    Days of Exercise per Week: 0 days    Minutes of Exercise per Session: 0 min  Stress: Stress Concern Present (12/20/2021)   [employer] of Occupational Health - Occupational Stress Questionnaire    Feeling of Stress : To some extent  Social Connections: Moderately Isolated (12/20/2021)   Social Connection and Isolation Panel [NHANES]    Frequency of Communication with Friends and Family: More than three  times a week    Frequency of Social Gatherings with Friends and Family: More than three times a week    Attends Religious Services: 1 to 4 times per year    Active Member of [employer] or Organizations: No    Attends Banker Meetings: Never    Marital Status: Divorced  Catering manager Violence: Not At Risk (12/20/2021)   Humiliation, Afraid, Rape, and Kick questionnaire    Fear of Current or Ex-Partner: No    Emotionally Abused: No    Physically Abused: No    Sexually Abused: No    FAMILY HISTORY: Family History  Problem Relation Age of Onset   Heart disease Mother    Hypertension Mother    Stroke Mother    Mental illness Mother    Heart disease Father    Hypertension Father    Diabetes Father    Colon polyps Brother    Heart disease Maternal Grandfather    Cancer Paternal Grandmother        mouth   Anesthesia problems Daughter    Breast cancer Maternal Aunt    Throat cancer Maternal Uncle    Thyroid disease Paternal Aunt    Hypotension Neg Hx    Malignant hyperthermia Neg Hx    Pseudochol deficiency Neg Hx    Colon cancer Neg Hx    Stomach cancer Neg Hx    Esophageal cancer Neg Hx    Pancreatic cancer Neg Hx    Rectal cancer Neg Hx     ALLERGIES:  is allergic to avocado, latex, and codeine.  MEDICATIONS:  Current Outpatient Medications   Medication Sig Dispense Refill   albuterol (VENTOLIN HFA) 108 (90 Base) MCG/ACT inhaler Inhale 2 puffs into the lungs every 6 (six) hours as needed for wheezing or shortness of breath. 18 g 0   albuterol (VENTOLIN HFA) 108 (90 Base) MCG/ACT inhaler INHALE 2 PUFFS INTO THE LUNGS EVERY 6 (SIX) HOURS AS NEEDED FOR WHEEZING OR SHORTNESS OF BREATH. 18 g 12   amLODipine (NORVASC) 5 MG tablet Take 1 tablet (5 mg total) by mouth daily. 90 tablet 3   aspirin EC 81 MG tablet Take 81 mg by mouth daily.     atorvastatin (LIPITOR) 80 MG tablet Take 1 tablet (80 mg total) by mouth daily, please schedule appointment for more refills 90 tablet 3   benzonatate (TESSALON) 200 MG capsule Take 1 capsule (200 mg total) by mouth 2 (two) times daily as needed for cough. 20 capsule 0   Blood Glucose Monitoring Suppl (TRUE METRIX METER) DEVI 1 kit by Does not apply route 4 (four) times daily. 1 Device 0   busPIRone (BUSPAR) 15 MG tablet Take 2 tablets (30 mg total) by mouth in the morning AND 1 tablet (15 mg total) daily at 12 noon AND 1 tablet (15 mg total) every evening. 120 tablet 3   clonazePAM (KLONOPIN) 0.5 MG tablet Take 1 tablet (0.5 mg total) by mouth 2 (two) times daily as needed for anxiety. 60 tablet 1   doxepin (SINEQUAN) 75 MG capsule Take 1 capsule (75 mg total) by mouth at bedtime. 30 capsule 3   escitalopram (LEXAPRO) 20 MG tablet Take 1 tablet (20 mg total) by mouth daily. 30 tablet 3   fluticasone (FLONASE) 50 MCG/ACT nasal spray Place 2 sprays into both nostrils daily. 16 g 0   Fluticasone-Umeclidin-Vilant (TRELEGY ELLIPTA) 100-62.5-25 MCG/ACT AEPB Inhale 1 puff into the lungs daily. 60 each 6   furosemide (LASIX) 20 MG  tablet Take 1 tablet (20 mg total) by mouth daily. 90 tablet 3   gabapentin (NEURONTIN) 600 MG tablet TAKE 1 TABLET BY MOUTH 4 TIMES DAILY 360 tablet 1   glucose blood (TRUE METRIX BLOOD GLUCOSE TEST) test strip Use as instructed 100 each 12   ipratropium-albuterol (DUONEB) 0.5-2.5 (3)  MG/3ML SOLN TAKE 3 MLS VIA NEBULIZATION EVERY 6 HOURS 360 mL 1   levothyroxine (SYNTHROID) 112 MCG tablet Take 1 tablet (112 mcg total) by mouth daily. 90 tablet 0   loperamide (IMODIUM A-D) 2 MG tablet Take 4 mg by mouth 4 (four) times daily as needed for diarrhea or loose stools.     losartan (COZAAR) 50 MG tablet Take 1.5 tablets (75 mg total) by mouth daily. 135 tablet 3   metFORMIN (GLUCOPHAGE) 500 MG tablet TAKE 1 TABLET (500 MG TOTAL) BY MOUTH 2 (TWO) TIMES DAILY WITH A MEAL. 180 tablet 1   mirtazapine (REMERON) 30 MG tablet Take 1 tablet (30 mg total) by mouth at bedtime. 30 tablet 3   montelukast (SINGULAIR) 10 MG tablet Take 1 tablet (10 mg total) by mouth at bedtime. 90 tablet 0   nebivolol (BYSTOLIC) 10 MG tablet Take 1 tablet (10 mg total) by mouth daily. 90 tablet 3   nitroGLYCERIN (NITROSTAT) 0.4 MG SL tablet Place 1 tablet (0.4 mg total) under the tongue every 5 (five) minutes as needed for chest pain. 25 tablet 3   omeprazole (PRILOSEC) 40 MG capsule Take 1 capsule (40 mg total) by mouth 2 (two) times daily before a meal. 60 capsule 5   sodium chloride (OCEAN) 0.65 % SOLN nasal spray Place 1 spray into both nostrils as needed for congestion.     Spacer/Aero-Holding Chambers (AEROCHAMBER MV) inhaler Use as instructed 1 each 1   ticagrelor (BRILINTA) 60 MG TABS tablet Take 1 tablet (60 mg total) by mouth 2 (two) times daily. 60 tablet 52   TRUEPLUS LANCETS 28G MISC 28 g by Does not apply route QID. 120 each 2   No current facility-administered medications for this visit.    REVIEW OF SYSTEMS:    10 Point review of Systems was done is negative except as noted above.   PHYSICAL EXAMINATION: ECOG PERFORMANCE STATUS: 2 - Symptomatic, <50% confined to bed  . Vitals:   11/20/22 1358  BP: 134/80  Pulse: 82  Resp: 15  Temp: 98.7 F (37.1 C)  SpO2: 93%   Filed Weights   11/20/22 1358  Weight: 147 lb 6.4 oz (66.9 kg)   .Body mass index is 27.4 kg/m.   GENERAL:alert, in  no acute distress and comfortable SKIN: no acute rashes, no significant lesions EYES: conjunctiva are pink and non-injected, sclera anicteric OROPHARYNX: MMM, no exudates, no oropharyngeal erythema or ulceration NECK: supple, no JVD LYMPH:  no palpable lymphadenopathy in the cervical, axillary or inguinal regions LUNGS: clear to auscultation b/l with normal respiratory effort HEART: regular rate & rhythm ABDOMEN:  normoactive bowel sounds , non tender, not distended. Extremity: no pedal edema PSYCH: alert & oriented x 3 with fluent speech NEURO: no focal motor/sensory deficits   LABORATORY DATA:  I have reviewed the data as listed  .    Latest Ref Rng & Units 11/20/2022    1:31 PM 08/21/2022    1:15 PM 02/18/2022    1:51 PM  CBC  WBC 4.0 - 10.5 K/uL 10.0  9.4  5.8   Hemoglobin 12.0 - 15.0 g/dL 42.5  95.6  38.7   Hematocrit 36.0 -  46.0 % 46.6  44.8  46.7   Platelets 150 - 400 K/uL 412  418  515     .    Latest Ref Rng & Units 11/20/2022    1:31 PM 08/21/2022    1:15 PM 02/18/2022    1:51 PM  CMP  Glucose 70 - 99 mg/dL 161  096  045   BUN 8 - 23 mg/dL 13  14  15    Creatinine 0.44 - 1.00 mg/dL 4.09  8.11  9.14   Sodium 135 - 145 mmol/L 142  140  139   Potassium 3.5 - 5.1 mmol/L 4.0  3.9  4.2   Chloride 98 - 111 mmol/L 108  105  106   CO2 22 - 32 mmol/L 29  28  25    Calcium 8.9 - 10.3 mg/dL 9.8  9.2  9.7   Total Protein 6.5 - 8.1 g/dL 7.2  6.6  7.5   Total Bilirubin 0.3 - 1.2 mg/dL 0.6  0.3  0.1   Alkaline Phos 38 - 126 U/L 115  98  91   AST 15 - 41 U/L 13  13  19    ALT 0 - 44 U/L 14  13  15     . 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 No results found.  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 Reactive from iron deficiency. 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 CBC showed WBC of 10.0K, hemoglobin of 15.7, and platelets nearly normalized to 412K. -platelets improved from 600s previously to 400s -iron labs -ferritin>100no indication for additional IV Iron at this time. -if there is concern that patient still has bleeding issues, discussed goal of acquiring higher levels of iron stores -TET2 mutation not translating to any blood disorders at this time -We discussed that her continued smoking could certainly be a reason for inflammatory increase in her platelets and patient -recommended to pursue complete smoking cessation. -No evidence of progression of the patient's suspected CHIP at this time. -continue to monitor with labs in 4 months -continue to follow-up with her gastroenterologist in regards to occasional black stool.   FOLLOW-UP: RTC with Dr Candise Che with labs in 4 months  The total time spent in the appointment was 20 minutes* .  All of the patient's questions were answered with apparent satisfaction. The patient knows to call the clinic with any problems, questions or concerns.   Wyvonnia Lora MD MS AAHIVMS [facility] Hematology/Oncology Physician [facility]  .*Total Encounter Time as defined by the Centers for Medicare and Medicaid Services includes, in addition to the face-to-face time of a patient visit (documented in the note above)  non-face-to-face time: obtaining and reviewing outside history, ordering and reviewing medications, tests or procedures, care coordination (  communications with other health care professionals or caregivers) and documentation in the medical record.    I,Mitra Faeizi,acting as a Neurosurgeon for Wyvonnia Lora, MD.,have documented all relevant documentation on the behalf of Wyvonnia Lora, MD,as directed by  Wyvonnia Lora, MD while in the presence of Wyvonnia Lora, MD.  .I have reviewed the above documentation for accuracy and completeness, and I agree with the above. Johney Maine MD

## 2022-11-22 ENCOUNTER — Encounter (HOSPITAL_COMMUNITY): Payer: Self-pay

## 2022-11-22 ENCOUNTER — Telehealth (HOSPITAL_COMMUNITY): Payer: Medicare Other | Admitting: Physician Assistant

## 2022-11-26 ENCOUNTER — Encounter: Payer: Self-pay | Admitting: Hematology

## 2022-12-13 ENCOUNTER — Other Ambulatory Visit: Payer: Self-pay

## 2022-12-13 ENCOUNTER — Telehealth (INDEPENDENT_AMBULATORY_CARE_PROVIDER_SITE_OTHER): Payer: Medicare Other | Admitting: Physician Assistant

## 2022-12-13 DIAGNOSIS — F3342 Major depressive disorder, recurrent, in full remission: Secondary | ICD-10-CM | POA: Diagnosis not present

## 2022-12-13 DIAGNOSIS — G4700 Insomnia, unspecified: Secondary | ICD-10-CM

## 2022-12-13 DIAGNOSIS — F411 Generalized anxiety disorder: Secondary | ICD-10-CM

## 2022-12-16 ENCOUNTER — Other Ambulatory Visit: Payer: Self-pay

## 2022-12-16 ENCOUNTER — Encounter: Payer: Self-pay | Admitting: Hematology

## 2022-12-16 ENCOUNTER — Encounter (HOSPITAL_COMMUNITY): Payer: Self-pay | Admitting: Physician Assistant

## 2022-12-16 MED ORDER — BUSPIRONE HCL 15 MG PO TABS
ORAL_TABLET | ORAL | 3 refills | Status: DC
  Filled 2022-12-16 (×3): qty 120, 30d supply, fill #0
  Filled 2023-01-14: qty 120, 30d supply, fill #1
  Filled 2023-02-18: qty 120, 30d supply, fill #2

## 2022-12-16 MED ORDER — DOXEPIN HCL 75 MG PO CAPS
75.0000 mg | ORAL_CAPSULE | Freq: Every day | ORAL | 3 refills | Status: DC
  Filled 2022-12-16: qty 30, 30d supply, fill #0
  Filled 2023-01-14: qty 30, 30d supply, fill #1
  Filled 2023-02-18: qty 30, 30d supply, fill #2

## 2022-12-16 MED ORDER — CLONAZEPAM 0.5 MG PO TABS: 0.5000 mg | ORAL_TABLET | Freq: Two times a day (BID) | ORAL | 1 refills | Status: DC | PRN

## 2022-12-16 MED ORDER — ESCITALOPRAM OXALATE 20 MG PO TABS
20.0000 mg | ORAL_TABLET | Freq: Every day | ORAL | 3 refills | Status: DC
  Filled 2022-12-16: qty 30, 30d supply, fill #0
  Filled 2023-01-14: qty 30, 30d supply, fill #1
  Filled 2023-02-18: qty 30, 30d supply, fill #2

## 2022-12-16 MED ORDER — MIRTAZAPINE 30 MG PO TABS
30.0000 mg | ORAL_TABLET | Freq: Every day | ORAL | 3 refills | Status: DC
  Filled 2022-12-16: qty 30, 30d supply, fill #0
  Filled 2023-01-14: qty 30, 30d supply, fill #1
  Filled 2023-02-18: qty 30, 30d supply, fill #2

## 2022-12-16 NOTE — Progress Notes (Signed)
BH MD/PA/NP OP Progress Note  Virtual Visit via Video Note  I connected with [name] [name] on 12/16/22 at  5:00 PM EDT by a video enabled telemedicine application and verified that I am speaking with the correct person using two identifiers.  Location: Patient: Home Provider: Clinic   I discussed the limitations of evaluation and management by telemedicine and the availability of in person appointments. The patient expressed understanding and agreed to proceed.  Follow Up Instructions:   I discussed the assessment and treatment plan with the patient. The patient was provided an opportunity to ask questions and all were answered. The patient agreed with the plan and demonstrated an understanding of the instructions.   The patient was advised to call back or seek an in-person evaluation if the symptoms worsen or if the condition fails to improve as anticipated.  I provided 11 minutes of non-face-to-face time during this encounter.  Meta Hatchet, PA   12/16/2022 2:46 PM KINUE GUDEMAN  MRN:  [id number]  Chief Complaint:  Chief Complaint  Patient presents with   Follow-up   Medication Refill   HPI:   [name] [name]. Verble is a 67 year old female with a past psychiatric history significant for insomnia, generalized anxiety disorder, and major depressive disorder who presents to [facility] via virtual video visit for follow-up and medication management.  Patient is currently being managed on the following medications:  Doxepin (Sinequan) 50 mg at bedtime Buspirone 15 mg 3 times daily in the morning/1 tablet at noon/1 tablet in the evening  Escitalopram 30 mg daily Clonazepam 0.5 mg 2 times daily as needed Mirtazapine 30 mg at bedtime  Patient reports no issues or concerns regarding her current medication regimen.  Patient denies the need for dosage adjustments at this time and is requesting refills on all of her medications following the  conclusion of the encounter.  Patient denies experiencing depression.  Patient also denies anxiety and further denies any stressors at this time. A GAD-7 screen was performed with the patient scoring a 3.  Patient is alert and oriented x4, pleasant, calm, cooperative, and fully engaged in conversation during the encounter.  Patient describes her mood as perfect.  Patient denies suicidal or homicidal ideations.  She further denies auditory or visual hallucinations and does not appear to be responding to internal/external stimuli.  Patient endorses good sleep and receives on average 8 hours of sleep each night.  Patient endorses decreased appetite and eats on average 1 meal per day.  Patient denies alcohol consumption or illicit drug use.  Patient endorses tobacco use and smokes on average a pack per day.  Visit Diagnosis:    ICD-10-CM   1. Major depressive disorder, recurrent, in full remission with anxious distress (HCC)  F33.42 doxepin (SINEQUAN) 75 MG capsule    busPIRone (BUSPAR) 15 MG tablet    escitalopram (LEXAPRO) 20 MG tablet    2. GAD (generalized anxiety disorder)  F41.1 busPIRone (BUSPAR) 15 MG tablet    escitalopram (LEXAPRO) 20 MG tablet    clonazePAM (KLONOPIN) 0.5 MG tablet    3. Insomnia, unspecified type  G47.00 mirtazapine (REMERON) 30 MG tablet      Past Psychiatric History:  Insomnia Major depressive disorder Generalized anxiety disorder  Past Medical History:  Past Medical History:  Diagnosis Date   Allergy    seasonal   Anemia    Anxiety    takes Lexapro daily   Arthritis    "back, from neck  down pass my bra area" (03/25/2017)   Asthma    Bartholin gland cyst 08/29/2011   Bruises easily    pt is on Effient   Chronic back pain    herniated nucleus pulposus   Chronic back pain    "neck to bra area; lower back" (03/25/2017)   Chronic kidney disease    recurrent UTI's this year 2022   COPD (chronic obstructive pulmonary disease) (HCC)    early stages    Coronary artery disease    Depression    takes Klonopin daily   Diabetes mellitus without complication (HCC)    Diverticulosis    Fibroadenoma of left breast    GERD (gastroesophageal reflux disease)    takes Nexium daily   H/O hiatal hernia    Heart attack (HCC) 2011   Hemorrhoids    Hernia    Hyperlipidemia    takes Lipitor daily   Hypertension    takes Losartan daily and Labetalol bid   Hypothyroidism    takes Synthroid daily   Insomnia    hydroxyzine prn   Joint pain    Pneumonia    "couple times" (03/25/2017)   Pre-diabetes    "just found out 1 wk ago" (03/25/2017)   Psoriasis    elbows,knees,back   Shortness of breath    with exertion   Slowing of urinary stream    Stress incontinence     Past Surgical History:  Procedure Laterality Date   ABDOMINAL EXPLORATION SURGERY  1977   ABDOMINAL HYSTERECTOMY  1977   "left one of my ovaries"   BACK SURGERY     BIOPSY  05/27/2019   Procedure: BIOPSY;  Surgeon: Corbin Ade, MD;  [facility];  Service: Endoscopy;;   COLONOSCOPY  2011   Dr. Christella Hartigan: Mild diverticulosis, descending diminutive colon polyp (not retrieved), next colonoscopy 10 years   CORONARY ANGIOPLASTY WITH STENT PLACEMENT  2011 X2   "regular stents didn't work; had to go back in in ~ 1 month and put in medicated stents"   DILATION AND CURETTAGE OF UTERUS     ESOPHAGOGASTRODUODENOSCOPY     ESOPHAGOGASTRODUODENOSCOPY (EGD) WITH PROPOFOL N/A 05/27/2019   normal esophagus, dilation, erosive gastropathy s/p biopsy, normal duodenum. Negative H.pylori.    LEFT HEART CATH AND CORONARY ANGIOGRAPHY N/A 03/26/2017   Procedure: LEFT HEART CATH AND CORONARY ANGIOGRAPHY;  Surgeon: Lyn Records, MD;  Location: [facility] INVASIVE CV LAB;  Service: Cardiovascular;  Laterality: N/A;   LUMBAR LAMINECTOMY/DECOMPRESSION MICRODISCECTOMY  08/05/2011   Procedure: LUMBAR LAMINECTOMY/DECOMPRESSION MICRODISCECTOMY;  Surgeon: Clydene Fake, MD;  Location: [facility] NEURO ORS;   Service: Neurosurgery;  Laterality: Right;  Right Lumbar four-five extraforaminal discectomy   MALONEY DILATION N/A 05/27/2019   Procedure: Elease Hashimoto DILATION;  Surgeon: Corbin Ade, MD;  Location: [facility];  Service: Endoscopy;  Laterality: N/A;  54   SHOULDER ARTHROSCOPY WITH ROTATOR CUFF REPAIR AND OPEN BICEPS TENODESIS Right 12/24/2021   Procedure: RIGHT SHOULDER ARTHROSCOPY WITH ROTATOR CUFF REPAIR AND BICEPS TENODESIS;  Surgeon: Eldred Manges, MD;  Location: [facility] OR;  Service: Orthopedics;  Laterality: Right;   TONSILLECTOMY     as a child   UPPER GASTROINTESTINAL ENDOSCOPY      Family Psychiatric History:  See intake H&P for full details. Reviewed, with no updates at this time.  Family History:  Family History  Problem Relation Age of Onset   Heart disease Mother    Hypertension Mother    Stroke Mother  Mental illness Mother    Heart disease Father    Hypertension Father    Diabetes Father    Colon polyps Brother    Heart disease Maternal Grandfather    Cancer Paternal Grandmother        mouth   Anesthesia problems Daughter    Breast cancer Maternal Aunt    Throat cancer Maternal Uncle    Thyroid disease Paternal Aunt    Hypotension Neg Hx    Malignant hyperthermia Neg Hx    Pseudochol deficiency Neg Hx    Colon cancer Neg Hx    Stomach cancer Neg Hx    Esophageal cancer Neg Hx    Pancreatic cancer Neg Hx    Rectal cancer Neg Hx     Social History:  Social History   Socioeconomic History   Marital status: Divorced    Spouse name: Not on file   Number of children: 1   Years of education: Not on file   Highest education level: 9th grade  Occupational History   Not on file  Tobacco Use   Smoking status: Every Day    Packs/day: 0.50    Years: 50.00    Additional pack years: 0.00    Total pack years: 25.00    Types: Cigarettes   Smokeless tobacco: Never  Vaping Use   Vaping Use: Some days   Substances: Nicotine  Substance and Sexual Activity    Alcohol use: Yes    Comment: once a week   Drug use: Yes    Types: Marijuana    Comment: history of cocaine use, been about a year, per patient (12/18/21)   Sexual activity: Not Currently    Birth control/protection: Surgical  Other Topics Concern   Not on file  Social History Narrative   Not on file   Social Determinants of Health   Financial Resource Strain: Low Risk  (06/09/2018)   Overall Financial Resource Strain (CARDIA)    Difficulty of Paying Living Expenses: Not hard at all  Food Insecurity: Food Insecurity Present (12/20/2021)   Hunger Vital Sign    Worried About Running Out of Food in the Last Year: Sometimes true    Ran Out of Food in the Last Year: Sometimes true  Transportation Needs: Unmet Transportation Needs (12/20/2021)   PRAPARE - Administrator, Civil Service (Medical): Yes    Lack of Transportation (Non-Medical): No  Physical Activity: Inactive (12/20/2021)   Exercise Vital Sign    Days of Exercise per Week: 0 days    Minutes of Exercise per Session: 0 min  Stress: Stress Concern Present (12/20/2021)   [employer] of Occupational Health - Occupational Stress Questionnaire    Feeling of Stress : To some extent  Social Connections: Moderately Isolated (12/20/2021)   Social Connection and Isolation Panel [NHANES]    Frequency of Communication with Friends and Family: More than three times a week    Frequency of Social Gatherings with Friends and Family: More than three times a week    Attends Religious Services: 1 to 4 times per year    Active Member of [employer] or Organizations: No    Attends Banker Meetings: Never    Marital Status: Divorced    Allergies:  Allergies  Allergen Reactions   Avocado Anaphylaxis   Latex Shortness Of Breath and Rash   Codeine Nausea Only    Metabolic Disorder Labs: Lab Results  Component Value Date   HGBA1C 5.3 12/18/2021   MPG  105.41 12/18/2021   MPG 119.76 10/05/2021   No results found for:  "PROLACTIN" Lab Results  Component Value Date   CHOL 229 (H) 03/21/2022   TRIG 99 03/21/2022   HDL 76 03/21/2022   CHOLHDL 3.0 03/21/2022   VLDL 15 04/16/2016   LDLCALC 136 (H) 03/21/2022   LDLCALC 69 11/24/2020   Lab Results  Component Value Date   TSH 3.180 09/10/2022   TSH 0.502 10/25/2021    Therapeutic Level Labs: No results found for: "LITHIUM" No results found for: "VALPROATE" No results found for: "CBMZ"  Current Medications: Current Outpatient Medications  Medication Sig Dispense Refill   albuterol (VENTOLIN HFA) 108 (90 Base) MCG/ACT inhaler Inhale 2 puffs into the lungs every 6 (six) hours as needed for wheezing or shortness of breath. 18 g 0   amLODipine (NORVASC) 5 MG tablet Take 1 tablet (5 mg total) by mouth daily. 90 tablet 3   aspirin EC 81 MG tablet Take 81 mg by mouth daily.     atorvastatin (LIPITOR) 80 MG tablet Take 1 tablet (80 mg total) by mouth daily, please schedule appointment for more refills 90 tablet 3   benzonatate (TESSALON) 200 MG capsule Take 1 capsule (200 mg total) by mouth 2 (two) times daily as needed for cough. (Patient not taking: Reported on 11/20/2022) 20 capsule 0   Blood Glucose Monitoring Suppl (TRUE METRIX METER) DEVI 1 kit by Does not apply route 4 (four) times daily. 1 Device 0   busPIRone (BUSPAR) 15 MG tablet Take 2 tablets (30 mg total) by mouth in the morning AND 1 tablet (15 mg total) daily at 12 noon AND 1 tablet (15 mg total) every evening. 120 tablet 3   clonazePAM (KLONOPIN) 0.5 MG tablet Take 1 tablet (0.5 mg total) by mouth 2 (two) times daily as needed for anxiety. 60 tablet 1   doxepin (SINEQUAN) 75 MG capsule Take 1 capsule (75 mg total) by mouth at bedtime. 30 capsule 3   escitalopram (LEXAPRO) 20 MG tablet Take 1 tablet (20 mg total) by mouth daily. 30 tablet 3   fluticasone (FLONASE) 50 MCG/ACT nasal spray Place 2 sprays into both nostrils daily. 16 g 0   Fluticasone-Umeclidin-Vilant (TRELEGY ELLIPTA) 100-62.5-25  MCG/ACT AEPB Inhale 1 puff into the lungs daily. 60 each 6   furosemide (LASIX) 20 MG tablet Take 1 tablet (20 mg total) by mouth daily. 90 tablet 3   gabapentin (NEURONTIN) 600 MG tablet TAKE 1 TABLET BY MOUTH 4 TIMES DAILY 360 tablet 1   glucose blood (TRUE METRIX BLOOD GLUCOSE TEST) test strip Use as instructed 100 each 12   ipratropium-albuterol (DUONEB) 0.5-2.5 (3) MG/3ML SOLN TAKE 3 MLS VIA NEBULIZATION EVERY 6 HOURS 360 mL 1   levothyroxine (SYNTHROID) 112 MCG tablet Take 1 tablet (112 mcg total) by mouth daily. 90 tablet 0   loperamide (IMODIUM A-D) 2 MG tablet Take 4 mg by mouth 4 (four) times daily as needed for diarrhea or loose stools. (Patient not taking: Reported on 11/20/2022)     losartan (COZAAR) 50 MG tablet Take 1.5 tablets (75 mg total) by mouth daily. 135 tablet 3   metFORMIN (GLUCOPHAGE) 500 MG tablet TAKE 1 TABLET (500 MG TOTAL) BY MOUTH 2 (TWO) TIMES DAILY WITH A MEAL. 180 tablet 1   mirtazapine (REMERON) 30 MG tablet Take 1 tablet (30 mg total) by mouth at bedtime. 30 tablet 3   montelukast (SINGULAIR) 10 MG tablet Take 1 tablet (10 mg total) by mouth at bedtime.  90 tablet 0   nebivolol (BYSTOLIC) 10 MG tablet Take 1 tablet (10 mg total) by mouth daily. 90 tablet 3   nitroGLYCERIN (NITROSTAT) 0.4 MG SL tablet Place 1 tablet (0.4 mg total) under the tongue every 5 (five) minutes as needed for chest pain. 25 tablet 3   omeprazole (PRILOSEC) 40 MG capsule Take 1 capsule (40 mg total) by mouth 2 (two) times daily before a meal. 60 capsule 5   sodium chloride (OCEAN) 0.65 % SOLN nasal spray Place 1 spray into both nostrils as needed for congestion.     Spacer/Aero-Holding Chambers (AEROCHAMBER MV) inhaler Use as instructed 1 each 1   ticagrelor (BRILINTA) 60 MG TABS tablet Take 1 tablet (60 mg total) by mouth 2 (two) times daily. 60 tablet 52   TRUEPLUS LANCETS 28G MISC 28 g by Does not apply route QID. 120 each 2   No current facility-administered medications for this visit.      Musculoskeletal: Strength & Muscle Tone: Unable to assess due to telemedicine visit Gait & Station: Unable to assess due to telemedicine visit Patient leans: Unable to assess due to telemedicine visit  Psychiatric Specialty Exam: Review of Systems  Psychiatric/Behavioral:  Negative for decreased concentration, dysphoric mood, hallucinations, self-injury, sleep disturbance and suicidal ideas. The patient is nervous/anxious. The patient is not hyperactive.     There were no vitals taken for this visit.There is no height or weight on file to calculate BMI.  General Appearance: Casual  Eye Contact:  Good  Speech:  Clear and Coherent and Normal Rate  Volume:  Normal  Mood:  Euthymic  Affect:  Appropriate  Thought Process:  Coherent, Goal Directed, and Descriptions of Associations: Intact  Orientation:  Full (Time, Place, and Person)  Thought Content: WDL   Suicidal Thoughts:  No  Homicidal Thoughts:  No  Memory:  Immediate;   Good Recent;   Good Remote;   Good  Judgement:  Fair  Insight:  Fair  Psychomotor Activity:  Normal  Concentration:  Concentration: Good and Attention Span: Good  Recall:  [employer] of Knowledge: Fair  Language: Fair  Akathisia:  Negative  Handed:  Right  AIMS (if indicated): not done  Assets:  Communication Skills Desire for Improvement Housing  ADL's:  Intact  Cognition: WNL  Sleep:  Good   Screenings: AUDIT    Flowsheet Row [facility]  Alcohol Use Disorder Identification Test Final Score (AUDIT) 12      GAD-7    Flowsheet Row Video Visit from 12/13/2022 in [facility] Video Visit from 09/20/2022 in [facility] Office Visit from 09/10/2022 in [facility] Office Visit from 08/02/2022 in [facility] Video Visit from 02/28/2022 in [facility]  Total GAD-7 Score 3 8 0 1 3      PHQ2-9    Flowsheet Row Video Visit from 12/13/2022 in [facility] Video Visit from 09/20/2022 in [facility] Office Visit from 09/10/2022 in [facility] Office Visit from 08/02/2022 in [facility] Video Visit from 02/28/2022 in [facility]  PHQ-2 Total Score 0 1 0 2 0  PHQ-9 Total Score -- -- 1 4 --      Flowsheet Row Video Visit from 12/13/2022 in [facility] Video Visit from 09/20/2022 in [facility] ED from 09[facility]04/2022 [facility] RISK CATEGORY No Risk No Risk No Risk        Assessment and Plan:   [name] [name]. Faulk is a 67 year old female with a past psychiatric history significant for insomnia, generalized anxiety disorder, and major depressive disorder who presents to [facility] via virtual video visit for follow-up and medication management.  Patient presents today encounter denying any issues or concerns with her current medication regimen.  Patient denies depression further endorses minimal anxiety.  Patient appears stable on her current medication regimen.  Patient to continue taking medications as prescribed.  Patient's medication to be e-prescribed to pharmacy of choice.  Collaboration of Care: Collaboration of Care: Medication Management AEB patient's medications being managed by this provider and Psychiatrist AEB patient being managed by this behavioral health provider  Patient/Guardian was advised Release of Information must be obtained prior to any record release in order to collaborate their care with an outside provider. Patient/Guardian was advised if they have not already done so to contact the registration department to sign all necessary  forms in order for [place] to release information regarding their care.   Consent: Patient/Guardian gives verbal consent for treatment and assignment of benefits for services provided during this visit. Patient/Guardian expressed understanding and agreed to proceed.   1. Major depressive disorder, recurrent, in full remission with anxious distress (HCC)  - doxepin (SINEQUAN) 75 MG capsule; Take 1 capsule (75 mg total) by mouth at bedtime.  Dispense: 30 capsule; Refill: 3 - busPIRone (BUSPAR) 15 MG tablet; Take 2 tablets (30 mg total) by mouth in the morning AND 1 tablet (15 mg total) daily at 12 noon AND 1 tablet (15 mg total) every evening.  Dispense: 120 tablet; Refill: 3 - escitalopram (LEXAPRO) 20 MG tablet; Take 1 tablet (20 mg total) by mouth daily.  Dispense: 30 tablet; Refill: 3  2. GAD (generalized anxiety disorder)  - busPIRone (BUSPAR) 15 MG tablet; Take 2 tablets (30 mg total) by mouth in the morning AND 1 tablet (15 mg total) daily at 12 noon AND 1 tablet (15 mg total) every evening.  Dispense: 120 tablet; Refill: 3 - escitalopram (LEXAPRO) 20 MG tablet; Take 1 tablet (20 mg total) by mouth daily.  Dispense: 30 tablet; Refill: 3 - clonazePAM (KLONOPIN) 0.5 MG tablet; Take 1 tablet (0.5 mg total) by mouth 2 (two) times daily as needed for anxiety.  Dispense: 60 tablet; Refill: 1  3. Insomnia, unspecified type  - mirtazapine (REMERON) 30 MG tablet; Take 1 tablet (30 mg total) by mouth at bedtime.  Dispense: 30 tablet; Refill: 3  Patient to follow up in 3 months Provider spent a total of 11 minutes with the patient/reviewing patient's chart  Meta Hatchet, PA 12/16/2022, 2:46 PM

## 2022-12-20 ENCOUNTER — Other Ambulatory Visit: Payer: Self-pay

## 2022-12-20 ENCOUNTER — Encounter: Payer: Self-pay | Admitting: Hematology

## 2022-12-20 ENCOUNTER — Other Ambulatory Visit: Payer: Self-pay | Admitting: Internal Medicine

## 2022-12-20 ENCOUNTER — Ambulatory Visit: Payer: Self-pay | Admitting: *Deleted

## 2022-12-20 DIAGNOSIS — E039 Hypothyroidism, unspecified: Secondary | ICD-10-CM

## 2022-12-20 MED ORDER — LEVOTHYROXINE SODIUM 112 MCG PO TABS
112.0000 ug | ORAL_TABLET | Freq: Every day | ORAL | 2 refills | Status: DC
  Filled 2022-12-20: qty 30, 30d supply, fill #0
  Filled 2023-01-14: qty 30, 30d supply, fill #1
  Filled 2023-02-18: qty 30, 30d supply, fill #2
  Filled 2023-03-31: qty 30, 30d supply, fill #3
  Filled 2023-05-12: qty 30, 30d supply, fill #4
  Filled 2023-06-06: qty 30, 30d supply, fill #5
  Filled 2023-07-08: qty 30, 30d supply, fill #6
  Filled 2023-08-07: qty 30, 30d supply, fill #7
  Filled 2023-08-15: qty 30, 30d supply, fill #0
  Filled 2023-08-15: qty 30, 30d supply, fill #7
  Filled 2023-12-02: qty 30, 30d supply, fill #1

## 2022-12-20 MED ORDER — GABAPENTIN 600 MG PO TABS
ORAL_TABLET | Freq: Four times a day (QID) | ORAL | 2 refills | Status: DC
  Filled 2022-12-20: qty 120, 30d supply, fill #0
  Filled 2023-01-14: qty 120, 30d supply, fill #1
  Filled 2023-02-18: qty 120, 30d supply, fill #2
  Filled 2023-05-21: qty 120, 30d supply, fill #3
  Filled 2023-07-08: qty 120, 30d supply, fill #4
  Filled 2023-08-07 – 2023-08-15 (×2): qty 120, 30d supply, fill #5
  Filled 2023-08-15: qty 120, 30d supply, fill #0
  Filled 2023-09-08: qty 120, 30d supply, fill #1
  Filled 2023-10-07: qty 120, 30d supply, fill #2
  Filled 2023-11-06: qty 120, 30d supply, fill #3

## 2022-12-20 MED ORDER — MONTELUKAST SODIUM 10 MG PO TABS
10.0000 mg | ORAL_TABLET | Freq: Every day | ORAL | 2 refills | Status: DC
  Filled 2022-12-20: qty 30, 30d supply, fill #0
  Filled 2023-01-14: qty 30, 30d supply, fill #1
  Filled 2023-02-18: qty 30, 30d supply, fill #2

## 2022-12-20 NOTE — Telephone Encounter (Signed)
Requested Prescriptions  Pending Prescriptions Disp Refills   gabapentin (NEURONTIN) 600 MG tablet 360 tablet 2    Sig: TAKE 1 TABLET BY MOUTH 4 TIMES DAILY     Neurology: Anticonvulsants - gabapentin Passed - 12/20/2022  7:22 AM      Passed - Cr in normal range and within 360 days    Creatinine  Date Value Ref Range Status  11/20/2022 0.78 0.44 - 1.00 mg/dL Final   Creat  Date Value Ref Range Status  04/16/2016 0.93 0.50 - 1.05 mg/dL Final    Comment:      For patients > or = 67 years of age: The upper reference limit for Creatinine is approximately 13% higher for people identified as African-American.      Creatinine,U  Date Value Ref Range Status  01/11/2010 61.7 mg/dL Final    Comment:    See lab report for associated comment(s)         Passed - Completed PHQ-2 or PHQ-9 in the last 360 days      Passed - Valid encounter within last 12 months    Recent Outpatient Visits           3 months ago Pulmonary emphysema, unspecified emphysema type [facility])   [place] [facility] [name], MD   4 months ago Viral respiratory illness   [place] [facility] [name], MD   1 year ago Pulmonary emphysema, unspecified emphysema type [facility])   [place] [facility] Storm Frisk, MD   1 year ago Preoperative evaluation to rule out surgical contraindication   [place] [facility] [name], MD   1 year ago Preoperative evaluation to rule out surgical contraindication   [place] [name], MD       Future Appointments             In 2 weeks [name], MD [place] [facility]             montelukast (SINGULAIR) 10 MG tablet 90 tablet 2    Sig: Take 1 tablet (10 mg total) by mouth at bedtime.     Pulmonology:  Leukotriene Inhibitors Passed - 12/20/2022  7:22 AM       Passed - Valid encounter within last 12 months    Recent Outpatient Visits           3 months ago Pulmonary emphysema, unspecified emphysema type [facility])   [place] [facility] [name], MD   4 months ago Viral respiratory illness   [place] [facility] [name], MD   1 year ago Pulmonary emphysema, unspecified emphysema type [facility])   [place] [facility] Storm Frisk, MD   1 year ago Preoperative evaluation to rule out surgical contraindication   [place] [facility] [name], MD   1 year ago Preoperative evaluation to rule out surgical contraindication   [place] [facility] [name], MD       Future Appointments             In 2 weeks [name], MD [facility]             levothyroxine (SYNTHROID) 112 MCG  tablet 90 tablet 2    Sig: Take 1 tablet (112 mcg total) by mouth daily.     Endocrinology:  Hypothyroid Agents Passed - 12/20/2022  7:22 AM      Passed - TSH in normal range and within 360 days    TSH  Date Value Ref Range Status  09/10/2022 3.180 0.450 - 4.500 uIU/mL Final         Passed - Valid encounter within last 12 months    Recent Outpatient Visits           3 months ago Pulmonary emphysema, unspecified emphysema type [facility])   [place] [facility] [name], MD   4 months ago Viral respiratory illness   [facility] [name], MD   1 year ago Pulmonary emphysema, unspecified emphysema type [facility])   [place] [facility] Storm Frisk, MD   1 year ago Preoperative evaluation to rule out surgical contraindication   [place] [facility] [name], MD   1 year ago Preoperative evaluation to rule out surgical  contraindication   [place] [facility] [name], MD       Future Appointments             In 2 weeks [name], MD [facility]

## 2022-12-20 NOTE — Telephone Encounter (Signed)
Call placed to patient unable to reach message left on VM.   

## 2022-12-20 NOTE — Telephone Encounter (Signed)
Reason for Disposition  Back pain is a chronic symptom (recurrent or ongoing AND present > 4 weeks)  Answer Assessment - Initial Assessment Questions 1. ONSET: "When did the pain begin?"      Lower back and into my hips.    "I'm old" is why reason I have it.  It began a week ago.   I can't handle the pain anymore. 2. LOCATION: "Where does it hurt?" (upper, mid or lower back)     My lower back.   I have chronic back pain but it's really bad right now.    I don't have any pain medicine.   I've taken Aleve and it's not helping.   3. SEVERITY: "How bad is the pain?"  (e.g., Scale 1-10; mild, moderate, or severe)   - MILD (1-3): Doesn't interfere with normal activities.    - MODERATE (4-7): Interferes with normal activities or awakens from sleep.    - SEVERE (8-10): Excruciating pain, unable to do any normal activities.      Moderate 4. PATTERN: "Is the pain constant?" (e.g., yes, no; constant, intermittent)      Constant  5. RADIATION: "Does the pain shoot into your legs or somewhere else?"     I have pain in my right side.    6. CAUSE:  "What do you think is causing the back pain?"      I have chronic pain  7. BACK OVERUSE:  "Any recent lifting of heavy objects, strenuous work or exercise?"     No 8. MEDICINES: "What have you taken so far for the pain?" (e.g., nothing, acetaminophen, NSAIDS)     Aleve 9. NEUROLOGIC SYMPTOMS: "Do you have any weakness, numbness, or problems with bowel/bladder control?"     No weakness or numbness   It's hard to walk due to the pain. 10. OTHER SYMPTOMS: "Do you have any other symptoms?" (e.g., fever, abdomen pain, burning with urination, blood in urine)       Not asked 11. PREGNANCY: "Is there any chance you are pregnant?" "When was your last menstrual period?"       N/A due to age  Protocols used: Back Pain-A-AH  Chief Complaint: Chronic lower back pain into hips Symptoms: Today the pain feels worse and it's hard for her to walk Frequency: Last few  days the pain has been getting worse. Pertinent Negatives: Patient denies Injuries or falls.  Disposition: [] ED /[] Urgent Care (no appt availability in office) / [] Appointment(In office/virtual)/ []  [facility]/ [] Home Care/ [] Refused Recommended Disposition /[] [facility]/ [x]  Follow-up with PCP Additional Notes: Message being sent to Dr. Jonah Blue with pt's request for pain medication.   She has an appt on 01/09/2023 with Dr. Laural Benes.   There were not any earlier appts with any of the providers.   Mobile unit not running today.   Encouraged her to go to the urgent care if she needed to over the weekend.

## 2022-12-20 NOTE — Telephone Encounter (Signed)
Summary: back pain   Pt is calling in regarding back pain and would like to talk to someone about this. Please advise.     Attempted to call patient- no answer- left message on VM to call back.

## 2022-12-22 ENCOUNTER — Encounter: Payer: Self-pay | Admitting: Hematology

## 2022-12-23 ENCOUNTER — Other Ambulatory Visit: Payer: Self-pay

## 2022-12-23 NOTE — Progress Notes (Unsigned)
Subjective:   [name] [name] is a 67 y.o. female who presents for Medicare Annual (Subsequent) preventive examination.  Review of Systems    ***       Objective:    There were no vitals filed for this visit. There is no height or weight on file to calculate BMI.     12/26/2021    1:14 PM 12/24/2021    1:32 PM 12/20/2021   11:09 AM 12/18/2021    3:12 PM 10/05/2021    3:10 PM 03/17/2021   11:28 AM 02/11/2021   12:04 AM  Advanced Directives  Does Patient Have a Medical Advance Directive? No  No No No No No  Would patient like information on creating a medical advance directive? Yes (MAU/Ambulatory/Procedural Areas - Information given) No - Patient declined  No - Patient declined  No - Patient declined     Current Medications (verified) Outpatient Encounter Medications as of 12/24/2022  Medication Sig   albuterol (VENTOLIN HFA) 108 (90 Base) MCG/ACT inhaler Inhale 2 puffs into the lungs every 6 (six) hours as needed for wheezing or shortness of breath.   amLODipine (NORVASC) 5 MG tablet Take 1 tablet (5 mg total) by mouth daily.   aspirin EC 81 MG tablet Take 81 mg by mouth daily.   atorvastatin (LIPITOR) 80 MG tablet Take 1 tablet (80 mg total) by mouth daily, please schedule appointment for more refills   benzonatate (TESSALON) 200 MG capsule Take 1 capsule (200 mg total) by mouth 2 (two) times daily as needed for cough. (Patient not taking: Reported on 11/20/2022)   Blood Glucose Monitoring Suppl (TRUE METRIX METER) DEVI 1 kit by Does not apply route 4 (four) times daily.   busPIRone (BUSPAR) 15 MG tablet Take 2 tablets (30 mg total) by mouth in the morning AND 1 tablet (15 mg total) daily at 12 noon AND 1 tablet (15 mg total) every evening.   clonazePAM (KLONOPIN) 0.5 MG tablet Take 1 tablet (0.5 mg total) by mouth 2 (two) times daily as needed for anxiety.   doxepin (SINEQUAN) 75 MG capsule Take 1 capsule (75 mg total) by mouth at bedtime.   escitalopram (LEXAPRO) 20 MG tablet Take 1  tablet (20 mg total) by mouth daily.   fluticasone (FLONASE) 50 MCG/ACT nasal spray Place 2 sprays into both nostrils daily.   Fluticasone-Umeclidin-Vilant (TRELEGY ELLIPTA) 100-62.5-25 MCG/ACT AEPB Inhale 1 puff into the lungs daily.   furosemide (LASIX) 20 MG tablet Take 1 tablet (20 mg total) by mouth daily.   gabapentin (NEURONTIN) 600 MG tablet TAKE 1 TABLET BY MOUTH 4 TIMES DAILY   glucose blood (TRUE METRIX BLOOD GLUCOSE TEST) test strip Use as instructed   ipratropium-albuterol (DUONEB) 0.5-2.5 (3) MG/3ML SOLN TAKE 3 MLS VIA NEBULIZATION EVERY 6 HOURS   levothyroxine (SYNTHROID) 112 MCG tablet Take 1 tablet (112 mcg total) by mouth daily.   loperamide (IMODIUM A-D) 2 MG tablet Take 4 mg by mouth 4 (four) times daily as needed for diarrhea or loose stools. (Patient not taking: Reported on 11/20/2022)   losartan (COZAAR) 50 MG tablet Take 1.5 tablets (75 mg total) by mouth daily.   metFORMIN (GLUCOPHAGE) 500 MG tablet TAKE 1 TABLET (500 MG TOTAL) BY MOUTH 2 (TWO) TIMES DAILY WITH A MEAL.   mirtazapine (REMERON) 30 MG tablet Take 1 tablet (30 mg total) by mouth at bedtime.   montelukast (SINGULAIR) 10 MG tablet Take 1 tablet (10 mg total) by mouth at bedtime.   nebivolol (BYSTOLIC)  10 MG tablet Take 1 tablet (10 mg total) by mouth daily.   nitroGLYCERIN (NITROSTAT) 0.4 MG SL tablet Place 1 tablet (0.4 mg total) under the tongue every 5 (five) minutes as needed for chest pain.   omeprazole (PRILOSEC) 40 MG capsule Take 1 capsule (40 mg total) by mouth 2 (two) times daily before a meal.   sodium chloride (OCEAN) 0.65 % SOLN nasal spray Place 1 spray into both nostrils as needed for congestion.   Spacer/Aero-Holding Chambers (AEROCHAMBER MV) inhaler Use as instructed   ticagrelor (BRILINTA) 60 MG TABS tablet Take 1 tablet (60 mg total) by mouth 2 (two) times daily.   TRUEPLUS LANCETS 28G MISC 28 g by Does not apply route QID.   No facility-administered encounter medications on file as of  12/24/2022.    Allergies (verified) Avocado, Latex, and Codeine   History: Past Medical History:  Diagnosis Date   Allergy    seasonal   Anemia    Anxiety    takes Lexapro daily   Arthritis    "back, from neck down pass my bra area" (03/25/2017)   Asthma    Bartholin gland cyst 08/29/2011   Bruises easily    pt is on Effient   Chronic back pain    herniated nucleus pulposus   Chronic back pain    "neck to bra area; lower back" (03/25/2017)   Chronic kidney disease    recurrent UTI's this year 2022   COPD (chronic obstructive pulmonary disease) (HCC)    early stages   Coronary artery disease    Depression    takes Klonopin daily   Diabetes mellitus without complication (HCC)    Diverticulosis    Fibroadenoma of left breast    GERD (gastroesophageal reflux disease)    takes Nexium daily   H/O hiatal hernia    Heart attack (HCC) 2011   Hemorrhoids    Hernia    Hyperlipidemia    takes Lipitor daily   Hypertension    takes Losartan daily and Labetalol bid   Hypothyroidism    takes Synthroid daily   Insomnia    hydroxyzine prn   Joint pain    Pneumonia    "couple times" (03/25/2017)   Pre-diabetes    "just found out 1 wk ago" (03/25/2017)   Psoriasis    elbows,knees,back   Shortness of breath    with exertion   Slowing of urinary stream    Stress incontinence    Past Surgical History:  Procedure Laterality Date   ABDOMINAL EXPLORATION SURGERY  1977   ABDOMINAL HYSTERECTOMY  1977   "left one of my ovaries"   BACK SURGERY     BIOPSY  05/27/2019   Procedure: BIOPSY;  Surgeon: Corbin Ade, MD;  Location: [facility];  Service: Endoscopy;;   COLONOSCOPY  2011   Dr. Christella Hartigan: Mild diverticulosis, descending diminutive colon polyp (not retrieved), next colonoscopy 10 years   CORONARY ANGIOPLASTY WITH STENT PLACEMENT  2011 X2   "regular stents didn't work; had to go back in in ~ 1 month and put in medicated stents"   DILATION AND CURETTAGE OF UTERUS      ESOPHAGOGASTRODUODENOSCOPY     ESOPHAGOGASTRODUODENOSCOPY (EGD) WITH PROPOFOL N/A 05/27/2019   normal esophagus, dilation, erosive gastropathy s/p biopsy, normal duodenum. Negative H.pylori.    LEFT HEART CATH AND CORONARY ANGIOGRAPHY N/A 03/26/2017   Procedure: LEFT HEART CATH AND CORONARY ANGIOGRAPHY;  Surgeon: Lyn Records, MD;  Location: [facility];  Service: Cardiovascular;  Laterality: N/A;   LUMBAR LAMINECTOMY/DECOMPRESSION MICRODISCECTOMY  08/05/2011   Procedure: LUMBAR LAMINECTOMY/DECOMPRESSION MICRODISCECTOMY;  Surgeon: Clydene Fake, MD;  Location: [facility];  Service: Neurosurgery;  Laterality: Right;  Right Lumbar four-five extraforaminal discectomy   MALONEY DILATION N/A 05/27/2019   Procedure: Elease Hashimoto DILATION;  Surgeon: Corbin Ade, MD;  Location: [facility];  Service: Endoscopy;  Laterality: N/A;  54   SHOULDER ARTHROSCOPY WITH ROTATOR CUFF REPAIR AND OPEN BICEPS TENODESIS Right 12/24/2021   Procedure: RIGHT SHOULDER ARTHROSCOPY WITH ROTATOR CUFF REPAIR AND BICEPS TENODESIS;  Surgeon: Eldred Manges, MD;  Location: [facility];  Service: Orthopedics;  Laterality: Right;   TONSILLECTOMY     as a child   UPPER GASTROINTESTINAL ENDOSCOPY     Family History  Problem Relation Age of Onset   Heart disease Mother    Hypertension Mother    Stroke Mother    Mental illness Mother    Heart disease Father    Hypertension Father    Diabetes Father    Colon polyps Brother    Heart disease Maternal Grandfather    Cancer Paternal Grandmother        [place]   Anesthesia problems Daughter    Breast cancer Maternal Aunt    Throat cancer Maternal Uncle    Thyroid disease Paternal Aunt    Hypotension Neg Hx    Malignant hyperthermia Neg Hx    Pseudochol deficiency Neg Hx    Colon cancer Neg Hx    Stomach cancer Neg Hx    Esophageal cancer Neg Hx    Pancreatic cancer Neg Hx    Rectal cancer Neg Hx    Social History   Socioeconomic History   Marital status: Divorced     Spouse name: Not on file   Number of children: 1   Years of education: Not on file   Highest education level: 9th grade  Occupational History   Not on file  Tobacco Use   Smoking status: Every Day    Packs/day: 0.50    Years: 50.00    Additional pack years: 0.00    Total pack years: 25.00    Types: Cigarettes   Smokeless tobacco: Never  Vaping Use   Vaping Use: Some days   Substances: Nicotine  Substance and Sexual Activity   Alcohol use: Yes    Comment: once a week   Drug use: Yes    Types: Marijuana    Comment: history of cocaine use, been about a year, per patient (12/18/21)   Sexual activity: Not Currently    Birth control/protection: Surgical  Other Topics Concern   Not on file  Social History Narrative   Not on file   Social Determinants of Health   Financial Resource Strain: Low Risk  (06/09/2018)   Overall Financial Resource Strain (CARDIA)    Difficulty of Paying Living Expenses: Not hard at all  Food Insecurity: Food Insecurity Present (12/20/2021)   Hunger Vital Sign    Worried About Running Out of Food in the Last Year: Sometimes true    Ran Out of Food in the Last Year: Sometimes true  Transportation Needs: Unmet Transportation Needs (12/20/2021)   PRAPARE - Administrator, Civil Service (Medical): Yes    Lack of Transportation (Non-Medical): No  Physical Activity: Inactive (12/20/2021)   Exercise Vital Sign    Days of Exercise per Week: 0 days    Minutes of Exercise per Session: 0 min  Stress:  Stress Concern Present (12/20/2021)   [employer] of Occupational Health - Occupational Stress Questionnaire    Feeling of Stress : To some extent  Social Connections: Moderately Isolated (12/20/2021)   Social Connection and Isolation Panel [NHANES]    Frequency of Communication with Friends and Family: More than three times a week    Frequency of Social Gatherings with Friends and Family: More than three times a week    Attends Religious Services: 1  to 4 times per year    Active Member of [employer] or Organizations: No    Attends Banker Meetings: Never    Marital Status: Divorced    Tobacco Counseling Ready to quit: Not Answered Counseling given: Not Answered   Clinical Intake:                 Diabetic?No          Activities of Daily Living     No data to display          Patient Care Team: [name], MD as PCP - General (Internal Medicine) Lennette Bihari, MD as PCP - Cardiology (Cardiology) Jena Gauss Gerrit Friends, MD as Consulting Physician (Gastroenterology)  Indicate any recent Medical Services you may have received from other than [facility] providers in the past year (date may be approximate).     Assessment:   This is a routine wellness examination for [name] [name].  Hearing/Vision screen No results found.  Dietary issues and exercise activities discussed:     Goals Addressed   None    Depression Screen    12/13/2022    5:10 PM 09/20/2022    3:27 PM 09/10/2022    2:45 PM 08/02/2022    3:44 PM 02/28/2022   11:23 AM 12/20/2021   11:02 AM 11/29/2021   11:35 AM  PHQ 2/9 Scores  PHQ - 2 Score   0 2  0   PHQ- 9 Score   1 4        Information is confidential and restricted. Go to Review Flowsheets to unlock data.    Fall Risk    09/10/2022    2:41 PM 08/02/2022    3:40 PM 12/20/2021   11:11 AM 11/13/2021    2:50 PM 10/25/2021    9:17 AM  Fall Risk   Falls in the past year? 0 0 1 0 0  Number falls in past yr: 0 0 0 0 0  Injury with Fall? 0 0 1 0 0  Risk for fall due to : No Fall Risks No Fall Risks History of fall(s) No Fall Risks No Fall Risks  Follow up   Falls evaluation completed      FALL RISK PREVENTION PERTAINING TO THE HOME:  Any stairs in or around the home? {YES/NO:21197} If so, are there any without handrails? {YES/NO:21197} Home free of loose throw rugs in walkways, pet beds, electrical cords, etc? {YES/NO:21197} Adequate lighting in your home to reduce risk of falls?  {YES/NO:21197}  ASSISTIVE DEVICES UTILIZED TO PREVENT FALLS:  Life alert? {YES/NO:21197} Use of a cane, walker or w/c? {YES/NO:21197} Grab bars in the bathroom? {YES/NO:21197} Shower chair or bench in shower? {YES/NO:21197} Elevated toilet seat or a handicapped toilet? {YES/NO:21197}  TIMED UP AND GO:  Was the test performed? No . Telephonic visit   Cognitive Function:        12/20/2021   11:12 AM  6CIT Screen  What Year? 0 points  What month? 0 points  What time? 0 points  Count back from 20 0 points  Months in reverse 0 points  Repeat phrase 0 points  Total Score 0 points    Immunizations Immunization History  Administered Date(s) Administered   Fluad Quad(high Dose 65+) 08/12/2022   Influenza Split 08/06/2011   Influenza Whole 04/05/2019   Influenza,inj,Quad PF,6+ Mos 07/10/2013, 08/09/2014, 04/16/2016, 03/27/2017, 04/02/2018, 06/15/2020, 09/14/2021   PFIZER(Purple Top)SARS-COV-2 Vaccination 09/23/2019, 10/14/2019   Pneumococcal Polysaccharide-23 08/07/2011, 03/27/2017   Tdap 03/17/2018    TDAP status: Up to date  {Pneumococcal vaccine status:2101807}  Covid-19 vaccine status: Information provided on how to obtain vaccines.   Qualifies for Shingles Vaccine? Yes   Zostavax completed Yes   Shingrix Completed?: No.    Education has been provided regarding the importance of this vaccine. Patient has been advised to call insurance company to determine out of pocket expense if they have not yet received this vaccine. Advised may also receive vaccine at local pharmacy or Health Dept. Verbalized acceptance and understanding.  Screening Tests Health Maintenance  Topic Date Due   Hepatitis C Screening  Never done   Zoster Vaccines- Shingrix (1 of 2) Never done   Pneumonia Vaccine 6+ Years old (2 of 2 - PCV) 03/27/2018   COVID-19 Vaccine (3 - Pfizer risk series) 11/11/2019   Lung Cancer Screening  04/11/2020   Diabetic kidney evaluation - Urine ACR  06/15/2021    DEXA SCAN  Never done   Medicare Annual Wellness (AWV)  12/21/2022   INFLUENZA VACCINE  02/13/2023   Diabetic kidney evaluation - eGFR measurement  11/20/2023   MAMMOGRAM  11/27/2023   Colonoscopy  02/20/2028   DTaP/Tdap/Td (2 - Td or Tdap) 03/17/2028   HPV VACCINES  Aged Out    Health Maintenance  Health Maintenance Due  Topic Date Due   Hepatitis C Screening  Never done   Zoster Vaccines- Shingrix (1 of 2) Never done   Pneumonia Vaccine 86+ Years old (2 of 2 - PCV) 03/27/2018   COVID-19 Vaccine (3 - Pfizer risk series) 11/11/2019   Lung Cancer Screening  04/11/2020   Diabetic kidney evaluation - Urine ACR  06/15/2021   DEXA SCAN  Never done   Medicare Annual Wellness (AWV)  12/21/2022    Colorectal cancer screening: Type of screening: Colonoscopy. Completed 02/19/21. Repeat every 7 years  Mammogram status: Completed 11/26/21. Repeat every year (scheduled for 02/24/23)  {Bone Density status:21018021}  Lung Cancer Screening: (Low Dose CT Chest recommended if Age 78-80 years, 30 pack-year currently smoking OR have quit w/in 15years.) does qualify.   Lung Cancer Screening Referral: ordered 09/10/22  Additional Screening:  Hepatitis C Screening: {DOES NOT does:27190::"does not"} qualify; Completed ***  Vision Screening: Recommended annual ophthalmology exams for early detection of glaucoma and other disorders of the eye. Is the patient up to date with their annual eye exam?  {YES/NO:21197} Who is the provider or what is the name of the office in which the patient attends annual eye exams? *** If pt is not established with a provider, would they like to be referred to a provider to establish care? {YES/NO:21197}.   Dental Screening: Recommended annual dental exams for proper oral hygiene  Community Resource Referral / Chronic Care Management: CRR required this visit?  {YES/NO:21197}  CCM required this visit?  {YES/NO:21197}     Plan:     I have personally reviewed and  noted the following in the patient's chart:   Medical and social history Use of alcohol, tobacco or illicit drugs  Current medications  and supplements including opioid prescriptions. {Opioid Prescriptions:[phone]} Functional ability and status Nutritional status Physical activity Advanced directives List of other physicians Hospitalizations, surgeries, and ER visits in previous 12 months Vitals Screenings to include cognitive, depression, and falls Referrals and appointments  In addition, I have reviewed and discussed with patient certain preventive protocols, quality metrics, and best practice recommendations. A written personalized care plan for preventive services as well as general preventive health recommendations were provided to patient.     Durwin Nora, [place]   1/61/0960   Due to this being a virtual visit, the after visit summary with patients personalized plan was offered to patient via mail or my-chart. ***Patient declined at this time./ Patient would like to access on my-chart/ per request, patient was mailed a copy of AVS./ Patient preferred to pick up at office at next visit  Nurse Notes: ***

## 2022-12-23 NOTE — Patient Instructions (Incomplete)
[name] [name] , Thank you for taking time to come for your Medicare Wellness Visit. I appreciate your ongoing commitment to your health goals. Please review the following plan we discussed and let me know if I can assist you in the future.   These are the goals we discussed:  Goals   None     This is a list of the screening recommended for you and due dates:  Health Maintenance  Topic Date Due   Hepatitis C Screening  Never done   Zoster (Shingles) Vaccine (1 of 2) Never done   Pneumonia Vaccine (2 of 2 - PCV) 03/27/2018   COVID-19 Vaccine (3 - Pfizer risk series) 11/11/2019   Screening for Lung Cancer  04/11/2020   Yearly kidney health urinalysis for diabetes  06/15/2021   DEXA scan (bone density measurement)  Never done   Medicare Annual Wellness Visit  12/21/2022   Flu Shot  02/13/2023   Yearly kidney function blood test for diabetes  11/20/2023   Mammogram  11/27/2023   Colon Cancer Screening  02/20/2028   DTaP/Tdap/Td vaccine (2 - Td or Tdap) 03/17/2028   HPV Vaccine  Aged Out    Advanced directives: Information on Advanced Care Planning can be found at [facility] Advance Health Care Directives Advance Health Care Directives ([email or web address]  Please bring a copy of your health care power of attorney and living will to the office to be added to your chart at your convenience.   Conditions/risks identified: Aim for 30 minutes of exercise or brisk walking, 6-8 glasses of water, and 5 servings of fruits and vegetables each day.   Next appointment: Follow up in one year for your annual wellness visit    Preventive Care 65 Years and Older, Female Preventive care refers to lifestyle choices and visits with your health care provider that can promote health and wellness. What does preventive care include? A yearly physical exam. This is also called an annual well check. Dental exams once or twice a year. Routine eye exams. Ask your health care provider how often you  should have your eyes checked. Personal lifestyle choices, including: Daily care of your teeth and gums. Regular physical activity. Eating a healthy diet. Avoiding tobacco and drug use. Limiting alcohol use. Practicing safe sex. Taking low-dose aspirin every day. Taking vitamin and mineral supplements as recommended by your health care provider. What happens during an annual well check? The services and screenings done by your health care provider during your annual well check will depend on your age, overall health, lifestyle risk factors, and family history of disease. Counseling  Your health care provider may ask you questions about your: Alcohol use. Tobacco use. Drug use. Emotional well-being. Home and relationship well-being. Sexual activity. Eating habits. History of falls. Memory and ability to understand (cognition). Work and work Astronomer. Reproductive health. Screening  You may have the following tests or measurements: Height, weight, and BMI. Blood pressure. Lipid and cholesterol levels. These may be checked every 5 years, or more frequently if you are over 54 years old. Skin check. Lung cancer screening. You may have this screening every year starting at age 100 if you have a 30-pack-year history of smoking and currently smoke or have quit within the past 15 years. Fecal occult blood test (FOBT) of the stool. You may have this test every year starting at age 50. Flexible sigmoidoscopy or colonoscopy. You may have a sigmoidoscopy every 5 years or a colonoscopy every 10 years  starting at age 12. Hepatitis C blood test. Hepatitis B blood test. Sexually transmitted disease (STD) testing. Diabetes screening. This is done by checking your blood sugar (glucose) after you have not eaten for a while (fasting). You may have this done every 1-3 years. Bone density scan. This is done to screen for osteoporosis. You may have this done starting at age 16. Mammogram. This may  be done every 1-2 years. Talk to your health care provider about how often you should have regular mammograms. Talk with your health care provider about your test results, treatment options, and if necessary, the need for more tests. Vaccines  Your health care provider may recommend certain vaccines, such as: Influenza vaccine. This is recommended every year. Tetanus, diphtheria, and acellular pertussis (Tdap, Td) vaccine. You may need a Td booster every 10 years. Zoster vaccine. You may need this after age 39. Pneumococcal 13-valent conjugate (PCV13) vaccine. One dose is recommended after age 30. Pneumococcal polysaccharide (PPSV23) vaccine. One dose is recommended after age 32. Talk to your health care provider about which screenings and vaccines you need and how often you need them. This information is not intended to replace advice given to you by your health care provider. Make sure you discuss any questions you have with your health care provider. Document Released: 07/28/2015 Document Revised: 03/20/2016 Document Reviewed: 05/02/2015 Elsevier Interactive Patient Education  2017 [employer].  Fall Prevention in the Home Falls can cause injuries. They can happen to people of all ages. There are many things you can do to make your home safe and to help prevent falls. What can I do on the outside of my home? Regularly fix the edges of walkways and driveways and fix any cracks. Remove anything that might make you trip as you walk through a door, such as a raised step or threshold. Trim any bushes or trees on the path to your home. Use bright outdoor lighting. Clear any walking paths of anything that might make someone trip, such as rocks or tools. Regularly check to see if handrails are loose or broken. Make sure that both sides of any steps have handrails. Any raised decks and porches should have guardrails on the edges. Have any leaves, snow, or ice cleared regularly. Use sand or salt  on walking paths during winter. Clean up any spills in your garage right away. This includes oil or grease spills. What can I do in the bathroom? Use night lights. Install grab bars by the toilet and in the tub and shower. Do not use towel bars as grab bars. Use non-skid mats or decals in the tub or shower. If you need to sit down in the shower, use a plastic, non-slip stool. Keep the floor dry. Clean up any water that spills on the floor as soon as it happens. Remove soap buildup in the tub or shower regularly. Attach bath mats securely with double-sided non-slip rug tape. Do not have throw rugs and other things on the floor that can make you trip. What can I do in the bedroom? Use night lights. Make sure that you have a light by your bed that is easy to reach. Do not use any sheets or blankets that are too big for your bed. They should not hang down onto the floor. Have a firm chair that has side arms. You can use this for support while you get dressed. Do not have throw rugs and other things on the floor that can make you trip. What  can I do in the kitchen? Clean up any spills right away. Avoid walking on wet floors. Keep items that you use a lot in easy-to-reach places. If you need to reach something above you, use a strong step stool that has a grab bar. Keep electrical cords out of the way. Do not use floor polish or wax that makes floors slippery. If you must use wax, use non-skid floor wax. Do not have throw rugs and other things on the floor that can make you trip. What can I do with my stairs? Do not leave any items on the stairs. Make sure that there are handrails on both sides of the stairs and use them. Fix handrails that are broken or loose. Make sure that handrails are as long as the stairways. Check any carpeting to make sure that it is firmly attached to the stairs. Fix any carpet that is loose or worn. Avoid having throw rugs at the top or bottom of the stairs. If you  do have throw rugs, attach them to the floor with carpet tape. Make sure that you have a light switch at the top of the stairs and the bottom of the stairs. If you do not have them, ask someone to add them for you. What else can I do to help prevent falls? Wear shoes that: Do not have high heels. Have rubber bottoms. Are comfortable and fit you well. Are closed at the toe. Do not wear sandals. If you use a stepladder: Make sure that it is fully opened. Do not climb a closed stepladder. Make sure that both sides of the stepladder are locked into place. Ask someone to hold it for you, if possible. Clearly mark and make sure that you can see: Any grab bars or handrails. First and last steps. Where the edge of each step is. Use tools that help you move around (mobility aids) if they are needed. These include: Canes. Walkers. Scooters. Crutches. Turn on the lights when you go into a dark area. Replace any light bulbs as soon as they burn out. Set up your furniture so you have a clear path. Avoid moving your furniture around. If any of your floors are uneven, fix them. If there are any pets around you, be aware of where they are. Review your medicines with your doctor. Some medicines can make you feel dizzy. This can increase your chance of falling. Ask your doctor what other things that you can do to help prevent falls. This information is not intended to replace advice given to you by your health care provider. Make sure you discuss any questions you have with your health care provider. Document Released: 04/27/2009 Document Revised: 12/07/2015 Document Reviewed: 08/05/2014 Elsevier Interactive Patient Education  2017 [employer].

## 2022-12-24 ENCOUNTER — Ambulatory Visit: Payer: Medicare HMO | Attending: Internal Medicine

## 2022-12-24 VITALS — Ht 61.5 in | Wt 147.0 lb

## 2022-12-24 DIAGNOSIS — Z0000 Encounter for general adult medical examination without abnormal findings: Secondary | ICD-10-CM | POA: Diagnosis not present

## 2023-01-09 ENCOUNTER — Ambulatory Visit: Payer: Medicare Other | Admitting: Internal Medicine

## 2023-01-10 ENCOUNTER — Telehealth: Payer: Medicare Other | Admitting: Internal Medicine

## 2023-01-14 ENCOUNTER — Other Ambulatory Visit: Payer: Self-pay

## 2023-01-15 ENCOUNTER — Encounter: Payer: Self-pay | Admitting: Emergency Medicine

## 2023-01-17 ENCOUNTER — Encounter: Payer: Self-pay | Admitting: Internal Medicine

## 2023-01-17 ENCOUNTER — Ambulatory Visit: Payer: Medicare HMO | Attending: Internal Medicine | Admitting: Internal Medicine

## 2023-01-17 ENCOUNTER — Other Ambulatory Visit: Payer: Self-pay | Admitting: Internal Medicine

## 2023-01-17 ENCOUNTER — Other Ambulatory Visit: Payer: Self-pay

## 2023-01-17 VITALS — BP 126/76 | HR 57 | Temp 98.1°F | Ht 61.0 in | Wt 145.0 lb

## 2023-01-17 DIAGNOSIS — J4489 Other specified chronic obstructive pulmonary disease: Secondary | ICD-10-CM

## 2023-01-17 DIAGNOSIS — R7303 Prediabetes: Secondary | ICD-10-CM | POA: Diagnosis not present

## 2023-01-17 DIAGNOSIS — L409 Psoriasis, unspecified: Secondary | ICD-10-CM

## 2023-01-17 DIAGNOSIS — Z23 Encounter for immunization: Secondary | ICD-10-CM

## 2023-01-17 DIAGNOSIS — I2510 Atherosclerotic heart disease of native coronary artery without angina pectoris: Secondary | ICD-10-CM

## 2023-01-17 DIAGNOSIS — F325 Major depressive disorder, single episode, in full remission: Secondary | ICD-10-CM

## 2023-01-17 DIAGNOSIS — Z7984 Long term (current) use of oral hypoglycemic drugs: Secondary | ICD-10-CM

## 2023-01-17 DIAGNOSIS — F1410 Cocaine abuse, uncomplicated: Secondary | ICD-10-CM

## 2023-01-17 DIAGNOSIS — Z122 Encounter for screening for malignant neoplasm of respiratory organs: Secondary | ICD-10-CM

## 2023-01-17 DIAGNOSIS — F17210 Nicotine dependence, cigarettes, uncomplicated: Secondary | ICD-10-CM | POA: Diagnosis not present

## 2023-01-17 DIAGNOSIS — F17200 Nicotine dependence, unspecified, uncomplicated: Secondary | ICD-10-CM

## 2023-01-17 DIAGNOSIS — F319 Bipolar disorder, unspecified: Secondary | ICD-10-CM

## 2023-01-17 LAB — POCT GLYCOSYLATED HEMOGLOBIN (HGB A1C): HbA1c, POC (prediabetic range): 5.7 % (ref 5.7–6.4)

## 2023-01-17 LAB — GLUCOSE, POCT (MANUAL RESULT ENTRY): POC Glucose: 122 mg/dl — AB (ref 70–99)

## 2023-01-17 MED ORDER — TRIAMCINOLONE ACETONIDE 0.025 % EX OINT
1.0000 | TOPICAL_OINTMENT | Freq: Two times a day (BID) | CUTANEOUS | 0 refills | Status: DC | PRN
Start: 2023-01-17 — End: 2023-09-16
  Filled 2023-01-17: qty 80, 30d supply, fill #0
  Filled 2023-01-17: qty 60, 30d supply, fill #0

## 2023-01-17 MED ORDER — ALBUTEROL SULFATE HFA 108 (90 BASE) MCG/ACT IN AERS
2.0000 | INHALATION_SPRAY | Freq: Four times a day (QID) | RESPIRATORY_TRACT | 12 refills | Status: DC | PRN
Start: 2023-01-17 — End: 2023-07-18
  Filled 2023-01-17 (×2): qty 18, 25d supply, fill #0
  Filled 2023-02-18: qty 18, 25d supply, fill #1

## 2023-01-17 MED ORDER — ZOSTER VAC RECOMB ADJUVANTED 50 MCG/0.5ML IM SUSR
0.5000 mL | Freq: Once | INTRAMUSCULAR | 0 refills | Status: DC
Start: 2023-01-17 — End: 2023-01-18
  Filled 2023-01-17: qty 0.5, 1d supply, fill #0

## 2023-01-17 NOTE — Progress Notes (Signed)
Patient ID: [name] [name], female    DOB: June 01, 1956  MRN: [id number]  CC: Emphesema (Pulmonary emphysema f/u. Herold Harms to update meds. Ottis Stain on L shoulder - unable to fully lift arm)   Subjective: [name] [name] is a 67 y.o. female who presents for chronic ds management Her concerns today include:  Patient with history of pre-DM, GERD, hypothyroidism, HTN, HL, CAD with previous stent, COPD, substance abuse, depression/anxiety and tobacco dependence, essential thrombocytosis, iron deficiency anemia   Daughter sets up her med box once a wk  PreDM: Results for orders placed or performed in visit on 01/17/23  POCT glucose (manual entry)  Result Value Ref Range   POC Glucose 122 (A) 70 - 99 mg/dl  POCT glycosylated hemoglobin (Hb A1C)  Result Value Ref Range   Hemoglobin A1C     HbA1c POC (<> result, manual entry)     HbA1c, POC (prediabetic range) 5.7 5.7 - 6.4 %   HbA1c, POC (controlled diabetic range)     *Note: Due to a large number of results and/or encounters for the requested time period, some results have not been displayed. A complete set of results can be found in Results Review.  Taking Metformin 500 mg BID as prescribed. Drinks supplement shakes to supplement meals as some times she does not have an appetite.   CAD/HTN/HL: no CP, LE edema.  On ASA and Brillinta Also compliant with taking amlodipine 5 mg daily, atorvastatin 80 mg daily, furosemide 20 mg daily, Bystolic 10 mg daily, -No CP, LE edema.  No recent use of SL Nitro. Gets winded easily.  Goes to the pool at her apartment complex several days a wk to swim.  Uses sun screen to protect from sun rays. -Seen by cardiology NP since last visit with me.  Question of whether she needs to continue both aspirin and Brilinta given the intermittent episodes of black stools was not addressed.  Pt states she did bring it up with her. -Saw the oncologist Dr. Candise Che in follow-up for IDA.  H/H normalized.  Did not need any iron  infusion at the time.  Iron level was 105 with 29% sat and ferritin was 142. H/H  15.7/46.6 Saw Dr. Myrtie Neither in f/u.  No discernible source of occult GI blood loss identified on studies including capsule endoscopy.  He recommends close monitoring of hemoglobin.  Patient requests CBC today.  Hypothyroid;  Compliant with levothyroxine 112 mcg daily.  TSH 3.180 08/2022  COPD/tobacco dependence:  gets winded very easily.  No recent flares.  Using Trelegy daily.  Also uses Albuterol inhaler once a day. Uses neb only when needed but not every day.   Still smoking and not ready to quit as yet Currently at 1 pk a day.  Smoked for over 20 yrs. I had referred her for lung cancer screening through pulmonary clinic.  Patient states she was never called.  Bipolar 1/GAD/MDD:  followed by [facility].  Feels stable on her current medications.  She endorses history of bipolar 1 but current behavioral health PA home she sees does not have that as one of her psychiatric dx  SUD:  smokes marijuana occasionally.  Admits that she still uses cocaine much more than she would like.  Used several times in the past month with last use few nights ago with a friend.  She knows it is not good for her health.  She does not want her daughter to know..   Requesting something to use for psoriasis.  Reports having several plaques on her arms, legs  HM: Due for PCV 15, Shingrix vaccine series, lung cancer screening Patient Active Problem List   Diagnosis Date Noted   Substance abuse (HCC) 09/10/2022   Nontraumatic complete tear of right rotator cuff    Superior glenoid labrum lesion of left shoulder    Iron deficiency anemia 11/02/2021   Major depressive disorder, recurrent, in full remission with anxious distress (HCC) 05/31/2021   Rotator cuff tendinitis, right 05/08/2021   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   Chronic neck pain 02/26/2021   GAD (generalized anxiety disorder) 01/08/2021   Acute cystitis without hematuria  11/07/2020   Influenza vaccine needed 06/15/2020   Abdominal pain, epigastric 04/28/2019   Esophageal dysphagia 04/28/2019   Bipolar I disorder (HCC) 09/17/2018   Insomnia 10/28/2017   Prediabetes 05/28/2017   QT prolongation 03/25/2017   Encounter for screening mammogram for breast cancer 09/11/2015   Psoriasis 06/22/2015   COPD (chronic obstructive pulmonary disease) (HCC)GOLD E 05/18/2015   Anxiety and depression 07/10/2013   Essential hypertension, benign 06/07/2013   Dyslipidemia 06/07/2013   Asthma, chronic 06/07/2013   Emphysema lung (HCC) 06/07/2013   Chronic back pain 06/07/2013   CAD S/P percutaneous coronary angioplasty 06/07/2013   GERD (gastroesophageal reflux disease) 06/07/2013   Breast lump on left side at 3 o'clock position 10/13/2012   S/P abdominal hysterectomy and right salpingo-oophorectomy 08/29/2011   TOBACCO ABUSE 07/17/2009   Chronic rhinitis 07/17/2009   Lung nodule < 6cm on CT 07/17/2009   ALLERGY, FOOD 07/17/2009   Hypothyroidism 12/22/2008     Current Outpatient Medications on File Prior to Visit  Medication Sig Dispense Refill   albuterol (VENTOLIN HFA) 108 (90 Base) MCG/ACT inhaler Inhale 2 puffs into the lungs every 6 (six) hours as needed for wheezing or shortness of breath. 18 g 0   amLODipine (NORVASC) 5 MG tablet Take 1 tablet (5 mg total) by mouth daily. 90 tablet 3   aspirin EC 81 MG tablet Take 81 mg by mouth daily.     atorvastatin (LIPITOR) 80 MG tablet Take 1 tablet (80 mg total) by mouth daily, please schedule appointment for more refills 90 tablet 3   Blood Glucose Monitoring Suppl (TRUE METRIX METER) DEVI 1 kit by Does not apply route 4 (four) times daily. 1 Device 0   busPIRone (BUSPAR) 15 MG tablet Take 2 tablets (30 mg total) by mouth in the morning AND 1 tablet (15 mg total) daily at 12 noon AND 1 tablet (15 mg total) every evening. 120 tablet 3   clonazePAM (KLONOPIN) 0.5 MG tablet Take 1 tablet (0.5 mg total) by mouth 2 (two)  times daily as needed for anxiety. 60 tablet 1   doxepin (SINEQUAN) 75 MG capsule Take 1 capsule (75 mg total) by mouth at bedtime. 30 capsule 3   escitalopram (LEXAPRO) 20 MG tablet Take 1 tablet (20 mg total) by mouth daily. 30 tablet 3   fluticasone (FLONASE) 50 MCG/ACT nasal spray Place 2 sprays into both nostrils daily. 16 g 0   Fluticasone-Umeclidin-Vilant (TRELEGY ELLIPTA) 100-62.5-25 MCG/ACT AEPB Inhale 1 puff into the lungs daily. 60 each 6   furosemide (LASIX) 20 MG tablet Take 1 tablet (20 mg total) by mouth daily. 90 tablet 3   gabapentin (NEURONTIN) 600 MG tablet TAKE 1 TABLET BY MOUTH 4 TIMES DAILY 360 tablet 2   glucose blood (TRUE METRIX BLOOD GLUCOSE TEST) test strip Use as instructed 100 each 12   ipratropium-albuterol (DUONEB) 0.5-2.5 (  3) MG/3ML SOLN TAKE 3 MLS VIA NEBULIZATION EVERY 6 HOURS 360 mL 1   levothyroxine (SYNTHROID) 112 MCG tablet Take 1 tablet (112 mcg total) by mouth daily. 90 tablet 2   loperamide (IMODIUM A-D) 2 MG tablet Take 4 mg by mouth 4 (four) times daily as needed for diarrhea or loose stools.     losartan (COZAAR) 50 MG tablet Take 1.5 tablets (75 mg total) by mouth daily. 135 tablet 3   metFORMIN (GLUCOPHAGE) 500 MG tablet TAKE 1 TABLET (500 MG TOTAL) BY MOUTH 2 (TWO) TIMES DAILY WITH A MEAL. 180 tablet 1   mirtazapine (REMERON) 30 MG tablet Take 1 tablet (30 mg total) by mouth at bedtime. 30 tablet 3   montelukast (SINGULAIR) 10 MG tablet Take 1 tablet (10 mg total) by mouth at bedtime. 90 tablet 2   nebivolol (BYSTOLIC) 10 MG tablet Take 1 tablet (10 mg total) by mouth daily. 90 tablet 3   omeprazole (PRILOSEC) 40 MG capsule Take 1 capsule (40 mg total) by mouth 2 (two) times daily before a meal. 60 capsule 5   sodium chloride (OCEAN) 0.65 % SOLN nasal spray Place 1 spray into both nostrils as needed for congestion.     Spacer/Aero-Holding Chambers (AEROCHAMBER MV) inhaler Use as instructed 1 each 1   ticagrelor (BRILINTA) 60 MG TABS tablet Take 1  tablet (60 mg total) by mouth 2 (two) times daily. 60 tablet 52   TRUEPLUS LANCETS 28G MISC 28 g by Does not apply route QID. 120 each 2   nitroGLYCERIN (NITROSTAT) 0.4 MG SL tablet Place 1 tablet (0.4 mg total) under the tongue every 5 (five) minutes as needed for chest pain. 25 tablet 3   No current facility-administered medications on file prior to visit.    Allergies  Allergen Reactions   Avocado Anaphylaxis   Latex Shortness Of Breath and Rash   Codeine Nausea Only    Social History   Socioeconomic History   Marital status: Divorced    Spouse name: Not on file   Number of children: 1   Years of education: Not on file   Highest education level: 9th grade  Occupational History   Not on file  Tobacco Use   Smoking status: Every Day    Packs/day: 0.50    Years: 50.00    Additional pack years: 0.00    Total pack years: 25.00    Types: Cigarettes   Smokeless tobacco: Never  Vaping Use   Vaping Use: Some days   Substances: Nicotine  Substance and Sexual Activity   Alcohol use: Yes    Comment: once a week   Drug use: Yes    Types: Marijuana    Comment: history of cocaine use, been about a year, per patient (12/18/21)   Sexual activity: Not Currently    Birth control/protection: Surgical  Other Topics Concern   Not on file  Social History Narrative   Not on file   Social Determinants of Health   Financial Resource Strain: Low Risk  (12/24/2022)   Overall Financial Resource Strain (CARDIA)    Difficulty of Paying Living Expenses: Not hard at all  Food Insecurity: No Food Insecurity (12/24/2022)   Hunger Vital Sign    Worried About Running Out of Food in the Last Year: Never true    Ran Out of Food in the Last Year: Never true  Transportation Needs: No Transportation Needs (12/24/2022)   PRAPARE - Administrator, Civil Service (Medical): No  Lack of Transportation (Non-Medical): No  Physical Activity: Inactive (12/24/2022)   Exercise Vital Sign    Days  of Exercise per Week: 0 days    Minutes of Exercise per Session: 0 min  Stress: No Stress Concern Present (12/24/2022)   [employer] of Occupational Health - Occupational Stress Questionnaire    Feeling of Stress : Only a little  Social Connections: Moderately Isolated (12/24/2022)   Social Connection and Isolation Panel [NHANES]    Frequency of Communication with Friends and Family: More than three times a week    Frequency of Social Gatherings with Friends and Family: Three times a week    Attends Religious Services: 1 to 4 times per year    Active Member of Clubs or Organizations: No    Attends Banker Meetings: Never    Marital Status: Divorced  Catering manager Violence: Not At Risk (12/24/2022)   Humiliation, Afraid, Rape, and Kick questionnaire    Fear of Current or Ex-Partner: No    Emotionally Abused: No    Physically Abused: No    Sexually Abused: No    Family History  Problem Relation Age of Onset   Heart disease Mother    Hypertension Mother    Stroke Mother    Mental illness Mother    Heart disease Father    Hypertension Father    Diabetes Father    Colon polyps Brother    Heart disease Maternal Grandfather    Cancer Paternal Grandmother        mouth   Anesthesia problems Daughter    Breast cancer Maternal Aunt    Throat cancer Maternal Uncle    Thyroid disease Paternal Aunt    Hypotension Neg Hx    Malignant hyperthermia Neg Hx    Pseudochol deficiency Neg Hx    Colon cancer Neg Hx    Stomach cancer Neg Hx    Esophageal cancer Neg Hx    Pancreatic cancer Neg Hx    Rectal cancer Neg Hx     Past Surgical History:  Procedure Laterality Date   ABDOMINAL EXPLORATION SURGERY  1977   ABDOMINAL HYSTERECTOMY  1977   "left one of my ovaries"   BACK SURGERY     BIOPSY  05/27/2019   Procedure: BIOPSY;  Surgeon: Corbin Ade, MD;  Location: [facility];  Service: Endoscopy;;   COLONOSCOPY  2011   Dr. Christella Hartigan: Mild diverticulosis,  descending diminutive colon polyp (not retrieved), next colonoscopy 10 years   CORONARY ANGIOPLASTY WITH STENT PLACEMENT  2011 X2   "regular stents didn't work; had to go back in in ~ 1 month and put in medicated stents"   DILATION AND CURETTAGE OF UTERUS     ESOPHAGOGASTRODUODENOSCOPY     ESOPHAGOGASTRODUODENOSCOPY (EGD) WITH PROPOFOL N/A 05/27/2019   normal esophagus, dilation, erosive gastropathy s/p biopsy, normal duodenum. Negative H.pylori.    LEFT HEART CATH AND CORONARY ANGIOGRAPHY N/A 03/26/2017   Procedure: LEFT HEART CATH AND CORONARY ANGIOGRAPHY;  Surgeon: Lyn Records, MD;  Location: [facility];  Service: Cardiovascular;  Laterality: N/A;   LUMBAR LAMINECTOMY/DECOMPRESSION MICRODISCECTOMY  08/05/2011   Procedure: LUMBAR LAMINECTOMY/DECOMPRESSION MICRODISCECTOMY;  Surgeon: Clydene Fake, MD;  Location: [facility];  Service: Neurosurgery;  Laterality: Right;  Right Lumbar four-five extraforaminal discectomy   MALONEY DILATION N/A 05/27/2019   Procedure: Elease Hashimoto DILATION;  Surgeon: Corbin Ade, MD;  Location: [facility];  Service: Endoscopy;  Laterality: N/A;  54   SHOULDER ARTHROSCOPY  WITH ROTATOR CUFF REPAIR AND OPEN BICEPS TENODESIS Right 12/24/2021   Procedure: RIGHT SHOULDER ARTHROSCOPY WITH ROTATOR CUFF REPAIR AND BICEPS TENODESIS;  Surgeon: Eldred Manges, MD;  Location: [facility] OR;  Service: Orthopedics;  Laterality: Right;   TONSILLECTOMY     as a child   UPPER GASTROINTESTINAL ENDOSCOPY      ROS: Review of Systems Negative except as stated above  PHYSICAL EXAM: BP 126/76 (BP Location: Left Arm, Patient Position: Sitting, Cuff Size: Normal)   Pulse (!) 57   Temp 98.1 F (36.7 C) (Oral)   Ht 5\' 1"  (1.549 m)   Wt 145 lb (65.8 kg)   SpO2 95%   BMI 27.40 kg/m   Wt Readings from Last 3 Encounters:  01/17/23 145 lb (65.8 kg)  12/24/22 147 lb (66.7 kg)  11/20/22 147 lb 6.4 oz (66.9 kg)    Physical Exam  General appearance - alert, well appearing,  older Caucasian female and in no distress Mental status - normal mood, behavior, speech, dress, motor activity, and thought processes Neck - supple, no significant adenopathy Chest -breath sounds are mild to moderately decreased with few scattered rhonchi when she coughs Heart - normal rate, regular rhythm, normal S1, S2, no murmurs, rubs, clicks or gallops Extremities -no lower extremity edema. Skin -Salmon colored plaques on the extensor surface of both elbows.  Has 1 silvery spot on the right big toe.     01/17/2023   10:20 AM 12/24/2022    1:41 PM 12/13/2022    5:10 PM  Depression screen PHQ 2/9  Decreased Interest 0 0 0  Down, Depressed, Hopeless 0 0 0  PHQ - 2 Score 0 0 0  Altered sleeping 0    Tired, decreased energy 1    Change in appetite 0    Feeling bad or failure about yourself  0    Trouble concentrating 0    Moving slowly or fidgety/restless 0    Suicidal thoughts 0    PHQ-9 Score 1         Latest Ref Rng & Units 11/20/2022    1:31 PM 08/21/2022    1:15 PM 02/18/2022    1:51 PM  CMP  Glucose 70 - 99 mg/dL 161  096  045   BUN 8 - 23 mg/dL 13  14  15    Creatinine 0.44 - 1.00 mg/dL 4.09  8.11  9.14   Sodium 135 - 145 mmol/L 142  140  139   Potassium 3.5 - 5.1 mmol/L 4.0  3.9  4.2   Chloride 98 - 111 mmol/L 108  105  106   CO2 22 - 32 mmol/L 29  28  25    Calcium 8.9 - 10.3 mg/dL 9.8  9.2  9.7   Total Protein 6.5 - 8.1 g/dL 7.2  6.6  7.5   Total Bilirubin 0.3 - 1.2 mg/dL 0.6  0.3  0.1   Alkaline Phos 38 - 126 U/L 115  98  91   AST 15 - 41 U/L 13  13  19    ALT 0 - 44 U/L 14  13  15     Lipid Panel     Component Value Date/Time   CHOL 229 (H) 03/21/2022 1218   TRIG 99 03/21/2022 1218   HDL 76 03/21/2022 1218   CHOLHDL 3.0 03/21/2022 1218   CHOLHDL 3.8 04/16/2016 0934   VLDL 15 04/16/2016 0934   LDLCALC 136 (H) 03/21/2022 1218    CBC    Component Value  Date/Time   WBC 10.0 11/20/2022 1331   WBC 7.1 11/28/2021 1645   RBC 4.87 11/20/2022 1331   HGB 15.7 (H)  11/20/2022 1331   HGB 13.0 09/14/2021 1058   HCT 46.6 (H) 11/20/2022 1331   HCT 41.5 09/14/2021 1058   PLT 412 (H) 11/20/2022 1331   PLT 541 (H) 09/14/2021 1058   MCV 95.7 11/20/2022 1331   MCV 87 09/14/2021 1058   MCH 32.2 11/20/2022 1331   MCHC 33.7 11/20/2022 1331   RDW 14.6 11/20/2022 1331   RDW 15.8 (H) 09/14/2021 1058   LYMPHSABS 2.0 11/20/2022 1331   LYMPHSABS 2.6 04/05/2019 0926   MONOABS 0.9 11/20/2022 1331   EOSABS 0.1 11/20/2022 1331   EOSABS 0.3 04/05/2019 0926   BASOSABS 0.1 11/20/2022 1331   BASOSABS 0.1 04/05/2019 0926    ASSESSMENT AND PLAN:  1. COPD with chronic bronchitis She will continue use of Trelegy and albuterol inhalers. Strongly encouraged her to quit smoking - albuterol (VENTOLIN HFA) 108 (90 Base) MCG/ACT inhaler; Inhale 2 puffs into the lungs every 6 (six) hours as needed for wheezing or shortness of breath.  Dispense: 18 g; Refill: 12  2. TOBACCO ABUSE See #1 above  3. Coronary artery disease involving native coronary artery of native heart without angina pectoris Stable.  Continue aspirin, Brilinta, atorvastatin 80 mg daily and Bystolic - CBC  4. PreDM Continue metformin and healthy eating habits. - POCT glucose (manual entry) - POCT glycosylated hemoglobin (Hb A1C)  5. Bipolar I disorder (HCC) Major depression in remission Plugged in with behavioral health.  Reports being stable on her current medications that include Lexapro, Remeron, doxepin.  6. Cocaine use disorder (HCC) Strongly advised to quit completely.  Discussed health risks associated with cocaine use including acute cardiovascular events.  She expresses understanding and will try to quit completely  7. Psoriasis Discussed the importance of keeping the skin well moisturized. - Ambulatory referral to Dermatology - triamcinolone (KENALOG) 0.025 % ointment; Apply 1 Application topically 2 (two) times daily as needed.  Dispense: 80 g; Refill: 0  8. Screening for lung  cancer Patient has greater than than 20 pack years of smoking history and continues to smoke.  She is agreeable to lung cancer screening. - CT CHEST LUNG CA SCREEN LOW DOSE W/O CM; Future  9. Need for vaccination against Streptococcus pneumoniae Pneumonia 15 given today. - PNEUMOCOCCAL CONJUGATE VACCINE 15-VALENT  10. Need for shingles vaccine Given prescription for Shingrix vaccine to take to our pharmacy to get her first shot. - Zoster Vaccine Adjuvanted [facility]) injection; Inject 0.5 mLs into the muscle once for 1 dose.  Dispense: 0.5 mL; Refill: 0     Patient was given the opportunity to ask questions.  Patient verbalized understanding of the plan and was able to repeat key elements of the plan.   This documentation was completed using Paediatric nurse.  Any transcriptional errors are unintentional.  Orders Placed This Encounter  Procedures   POCT glucose (manual entry)   POCT glycosylated hemoglobin (Hb A1C)     Requested Prescriptions    No prescriptions requested or ordered in this encounter    No follow-ups on file.  Jonah Blue, MD, FACP

## 2023-01-18 LAB — CBC
Hematocrit: 45.8 % (ref 34.0–46.6)
Hemoglobin: 15.1 g/dL (ref 11.1–15.9)
MCH: 31.9 pg (ref 26.6–33.0)
MCHC: 33 g/dL (ref 31.5–35.7)
MCV: 97 fL (ref 79–97)
Platelets: 420 10*3/uL (ref 150–450)
RBC: 4.73 x10E6/uL (ref 3.77–5.28)
RDW: 13.2 % (ref 11.7–15.4)
WBC: 9.3 10*3/uL (ref 3.4–10.8)

## 2023-01-24 ENCOUNTER — Other Ambulatory Visit (HOSPITAL_COMMUNITY): Payer: Self-pay

## 2023-02-04 ENCOUNTER — Encounter: Payer: Self-pay | Admitting: Internal Medicine

## 2023-02-07 ENCOUNTER — Ambulatory Visit: Admission: RE | Admit: 2023-02-07 | Payer: Medicare HMO | Source: Ambulatory Visit

## 2023-02-07 DIAGNOSIS — F17210 Nicotine dependence, cigarettes, uncomplicated: Secondary | ICD-10-CM | POA: Diagnosis not present

## 2023-02-07 DIAGNOSIS — Z122 Encounter for screening for malignant neoplasm of respiratory organs: Secondary | ICD-10-CM | POA: Diagnosis not present

## 2023-02-17 ENCOUNTER — Telehealth: Payer: Self-pay | Admitting: *Deleted

## 2023-02-17 NOTE — Telephone Encounter (Signed)
  Chief Complaint: NA Symptoms: NA Frequency: NA Pertinent Negatives: Patient denies NA Disposition: [] ED /[] Urgent Care (no appt availability in office) / [] Appointment(In office/virtual)/ []  [place] [facility]/ [] Home Care/ [] Refused Recommended Disposition /[] [place] [facility]/ []  Follow-up with PCP Additional Notes: 'Ray' w with [facility] calling with report of CT from 02/07/23. Available in Epic.   IMPRESSION: 1. Lung-RADS 3, probably benign findings. Short-term follow-up in 6 months is recommended with repeat low-dose chest CT without contrast (please use the following order, "CT CHEST LCS NODULE FOLLOW-UP W/O CM"). Three new scattered solid pulmonary nodules, largest 5.0 mm in the posterior left upper lobe. 2. Three-vessel coronary atherosclerosis. 3. Aortic Atherosclerosis (ICD10-I70.0) and Emphysema (ICD10-J43.9).

## 2023-02-17 NOTE — Telephone Encounter (Signed)
Noted. Routing to PCP.

## 2023-02-18 ENCOUNTER — Other Ambulatory Visit: Payer: Self-pay | Admitting: Physician Assistant

## 2023-02-18 ENCOUNTER — Other Ambulatory Visit: Payer: Self-pay

## 2023-02-18 DIAGNOSIS — E119 Type 2 diabetes mellitus without complications: Secondary | ICD-10-CM | POA: Diagnosis not present

## 2023-02-18 DIAGNOSIS — H5211 Myopia, right eye: Secondary | ICD-10-CM | POA: Diagnosis not present

## 2023-02-18 DIAGNOSIS — K219 Gastro-esophageal reflux disease without esophagitis: Secondary | ICD-10-CM

## 2023-02-18 DIAGNOSIS — H04123 Dry eye syndrome of bilateral lacrimal glands: Secondary | ICD-10-CM | POA: Diagnosis not present

## 2023-02-18 DIAGNOSIS — H43813 Vitreous degeneration, bilateral: Secondary | ICD-10-CM | POA: Diagnosis not present

## 2023-02-18 DIAGNOSIS — H5231 Anisometropia: Secondary | ICD-10-CM | POA: Diagnosis not present

## 2023-02-18 DIAGNOSIS — H5202 Hypermetropia, left eye: Secondary | ICD-10-CM | POA: Diagnosis not present

## 2023-02-18 DIAGNOSIS — H2513 Age-related nuclear cataract, bilateral: Secondary | ICD-10-CM | POA: Diagnosis not present

## 2023-02-18 DIAGNOSIS — H52223 Regular astigmatism, bilateral: Secondary | ICD-10-CM | POA: Diagnosis not present

## 2023-02-19 ENCOUNTER — Telehealth: Payer: Self-pay | Admitting: Internal Medicine

## 2023-02-19 ENCOUNTER — Other Ambulatory Visit: Payer: Self-pay

## 2023-02-19 MED ORDER — OMEPRAZOLE 40 MG PO CPDR
40.0000 mg | DELAYED_RELEASE_CAPSULE | Freq: Two times a day (BID) | ORAL | 5 refills | Status: DC
Start: 2023-02-19 — End: 2023-09-08
  Filled 2023-02-19: qty 60, 30d supply, fill #0
  Filled 2023-03-31: qty 60, 30d supply, fill #1
  Filled 2023-05-12: qty 60, 30d supply, fill #2
  Filled 2023-06-06: qty 60, 30d supply, fill #3
  Filled 2023-07-08: qty 60, 30d supply, fill #4
  Filled 2023-08-07 – 2023-08-15 (×2): qty 60, 30d supply, fill #5
  Filled 2023-08-15: qty 60, 30d supply, fill #0

## 2023-02-19 NOTE — Telephone Encounter (Signed)
Phone call placed to patient today to follow-up on CAT scan of the chest results.  Patient states she did see the MyChart message that I sent her regarding the results.  I told her that they saw spots in the lungs most of which have remained stable in size since last CAT scan of the chest in 2020.  However they saw 3 new ones and the radiologist recommends repeat scan in 6 months to see if there is any change in size of the 3 new nodules.  Advised patient that she is at risk for lung cancer given her history of cigarette smoking.  Continue to encourage her to quit.  All questions were answered.  Advised patient that we plan to repeat CAT scan in January.

## 2023-02-21 ENCOUNTER — Other Ambulatory Visit: Payer: Self-pay

## 2023-02-24 ENCOUNTER — Ambulatory Visit
Admission: RE | Admit: 2023-02-24 | Discharge: 2023-02-24 | Disposition: A | Discharge status: Home / Self Care | Payer: Medicare HMO | Source: Ambulatory Visit | Attending: Internal Medicine | Admitting: Internal Medicine

## 2023-02-24 ENCOUNTER — Inpatient Hospital Stay: Admission: RE | Admit: 2023-02-24 | Payer: Medicare Other | Source: Ambulatory Visit

## 2023-02-24 DIAGNOSIS — Z1231 Encounter for screening mammogram for malignant neoplasm of breast: Secondary | ICD-10-CM | POA: Diagnosis not present

## 2023-02-24 DIAGNOSIS — M8588 Other specified disorders of bone density and structure, other site: Secondary | ICD-10-CM | POA: Diagnosis not present

## 2023-02-24 DIAGNOSIS — E349 Endocrine disorder, unspecified: Secondary | ICD-10-CM | POA: Diagnosis not present

## 2023-02-24 DIAGNOSIS — Z780 Asymptomatic menopausal state: Secondary | ICD-10-CM

## 2023-02-24 DIAGNOSIS — N950 Postmenopausal bleeding: Secondary | ICD-10-CM | POA: Diagnosis not present

## 2023-02-26 ENCOUNTER — Telehealth: Payer: Self-pay | Admitting: Internal Medicine

## 2023-02-26 DIAGNOSIS — H4322 Crystalline deposits in vitreous body, left eye: Secondary | ICD-10-CM | POA: Diagnosis not present

## 2023-02-26 DIAGNOSIS — H43813 Vitreous degeneration, bilateral: Secondary | ICD-10-CM | POA: Diagnosis not present

## 2023-02-26 DIAGNOSIS — H2513 Age-related nuclear cataract, bilateral: Secondary | ICD-10-CM | POA: Diagnosis not present

## 2023-02-26 DIAGNOSIS — H3582 Retinal ischemia: Secondary | ICD-10-CM | POA: Diagnosis not present

## 2023-02-26 NOTE — Telephone Encounter (Signed)
PC placed to pt this afternoon.  I LVMM informing of who I am and that I was trying to catch up with her to go over results.  I will try her again later this week.

## 2023-02-26 NOTE — Telephone Encounter (Signed)
Phone call placed to patient this morning to discuss results of bone density.  I called and left a message on her voicemail informing her of who I am and my reason for calling.  I then called back and her daughter, [name], answered.  She stated that patient is currently in the doctor's office.  I told her I will call them back later to discuss results.

## 2023-02-26 NOTE — Telephone Encounter (Signed)
The patient called back stating she was sorry but she was in another appt and couldn't talk. She was informed her provider will be calling back later this week to go over her bone density results

## 2023-02-27 ENCOUNTER — Telehealth: Payer: Self-pay | Admitting: Internal Medicine

## 2023-02-27 ENCOUNTER — Other Ambulatory Visit: Payer: Self-pay | Admitting: Internal Medicine

## 2023-02-27 ENCOUNTER — Other Ambulatory Visit: Payer: Self-pay

## 2023-02-27 DIAGNOSIS — M8580 Other specified disorders of bone density and structure, unspecified site: Secondary | ICD-10-CM

## 2023-02-27 DIAGNOSIS — M85859 Other specified disorders of bone density and structure, unspecified thigh: Secondary | ICD-10-CM

## 2023-02-27 MED ORDER — ALENDRONATE SODIUM 35 MG PO TABS
35.0000 mg | ORAL_TABLET | ORAL | 1 refills | Status: DC
Start: 2023-02-27 — End: 2023-09-16
  Filled 2023-02-27: qty 12, 84d supply, fill #0
  Filled 2023-06-06 – 2023-06-26 (×2): qty 12, 84d supply, fill #1

## 2023-02-27 NOTE — Telephone Encounter (Signed)
Phone call placed to patient today to go over the results of bone density study.  Patient informed that the bone density study shows that she has osteopenia with a density of -2.4 at the hips.  Her 10-year risk of developing hip fracture is greater than 3%.  Because of this, I recommend starting her on Fosamax 35 mg once a week to help prevent hip fracture.  Also recommend taking Os-Cal plus vitamin D 600/401 tablet twice a day from over-the-counter.  Before starting the Fosamax, I would like for her to come to the lab to have blood test done to check her calcium and vitamin D levels.  Patient expressed understanding. Informed her that the Fosamax can cause worsening of acid reflux and even esophagitis.  Patient is on omeprazole for acid reflux.  I told her that should she notice any worsening of acid reflux or problems swallowing once she starts the medicine she should stop it and let me know. I went over with her how to take Fosamax.  Advised to take it once a week first thing in the morning with a big glass of water before any of her other medications.  She needs to be sitting upright when she takes the medicine and avoid laying down or taking any of her other medicines for at least 30 minutes after taking the Fosamax.  She expressed understanding.  I will send her this information via MyChart as well

## 2023-02-28 ENCOUNTER — Other Ambulatory Visit: Payer: Self-pay

## 2023-03-04 ENCOUNTER — Encounter: Payer: Self-pay | Admitting: Hematology

## 2023-03-04 ENCOUNTER — Ambulatory Visit: Payer: Medicare HMO | Attending: Internal Medicine

## 2023-03-04 DIAGNOSIS — Z780 Asymptomatic menopausal state: Secondary | ICD-10-CM | POA: Diagnosis not present

## 2023-03-04 DIAGNOSIS — M8580 Other specified disorders of bone density and structure, unspecified site: Secondary | ICD-10-CM | POA: Diagnosis not present

## 2023-03-05 ENCOUNTER — Other Ambulatory Visit: Payer: Self-pay

## 2023-03-05 LAB — BASIC METABOLIC PANEL
BUN/Creatinine Ratio: 15 (ref 12–28)
BUN: 13 mg/dL (ref 8–27)
CO2: 26 mmol/L (ref 20–29)
Calcium: 9.7 mg/dL (ref 8.7–10.3)
Chloride: 100 mmol/L (ref 96–106)
Creatinine, Ser: 0.86 mg/dL (ref 0.57–1.00)
Glucose: 74 mg/dL (ref 70–99)
Potassium: 4.9 mmol/L (ref 3.5–5.2)
Sodium: 139 mmol/L (ref 134–144)
eGFR: 74 mL/min/{1.73_m2} (ref >59)

## 2023-03-05 LAB — VITAMIN D 25 HYDROXY (VIT D DEFICIENCY, FRACTURES): Vit D, 25-Hydroxy: 35 ng/mL (ref 30.0–100.0)

## 2023-03-10 ENCOUNTER — Other Ambulatory Visit: Payer: Self-pay

## 2023-03-10 ENCOUNTER — Other Ambulatory Visit: Payer: Self-pay | Admitting: Internal Medicine

## 2023-03-10 MED ORDER — TRELEGY ELLIPTA 100-62.5-25 MCG/ACT IN AEPB
1.0000 | INHALATION_SPRAY | Freq: Every day | RESPIRATORY_TRACT | 2 refills | Status: DC
  Filled 2023-03-10: qty 60, 30d supply, fill #0

## 2023-03-11 ENCOUNTER — Other Ambulatory Visit: Payer: Self-pay

## 2023-03-12 ENCOUNTER — Encounter: Payer: Self-pay | Admitting: Hematology

## 2023-03-14 ENCOUNTER — Telehealth (INDEPENDENT_AMBULATORY_CARE_PROVIDER_SITE_OTHER): Payer: Medicaid Other | Admitting: Physician Assistant

## 2023-03-14 ENCOUNTER — Other Ambulatory Visit: Payer: Self-pay

## 2023-03-14 DIAGNOSIS — F3342 Major depressive disorder, recurrent, in full remission: Secondary | ICD-10-CM

## 2023-03-14 DIAGNOSIS — F411 Generalized anxiety disorder: Secondary | ICD-10-CM

## 2023-03-14 MED ORDER — ESCITALOPRAM OXALATE 20 MG PO TABS
20.0000 mg | ORAL_TABLET | Freq: Every day | ORAL | 3 refills | Status: DC
  Filled 2023-03-14 – 2023-03-31 (×2): qty 30, 30d supply, fill #0

## 2023-03-14 MED ORDER — DOXEPIN HCL 75 MG PO CAPS
75.0000 mg | ORAL_CAPSULE | Freq: Every day | ORAL | 3 refills | Status: DC
  Filled 2023-03-14 – 2023-03-31 (×2): qty 30, 30d supply, fill #0

## 2023-03-14 MED ORDER — BUSPIRONE HCL 15 MG PO TABS
ORAL_TABLET | ORAL | 3 refills | Status: DC
  Filled 2023-03-14 – 2023-03-31 (×2): qty 120, 30d supply, fill #0

## 2023-03-14 MED ORDER — MIRTAZAPINE 30 MG PO TABS
30.0000 mg | ORAL_TABLET | Freq: Every day | ORAL | 3 refills | Status: DC
  Filled 2023-03-14 – 2023-03-31 (×2): qty 30, 30d supply, fill #0

## 2023-03-14 MED ORDER — CLONAZEPAM 0.5 MG PO TABS
0.5000 mg | ORAL_TABLET | Freq: Two times a day (BID) | ORAL | 1 refills | Status: DC | PRN
  Filled 2023-03-14: qty 60, 30d supply, fill #0

## 2023-03-17 ENCOUNTER — Encounter (HOSPITAL_COMMUNITY): Payer: Self-pay | Admitting: Physician Assistant

## 2023-03-17 NOTE — Progress Notes (Signed)
BH MD/PA/NP OP Progress Note  Virtual Visit via Video Note  I connected with [name] [name] on 03/17/23 at  4:30 PM EDT by a video enabled telemedicine application and verified that I am speaking with the correct person using two identifiers.  Location: Patient: Home Provider: Clinic   I discussed the limitations of evaluation and management by telemedicine and the availability of in person appointments. The patient expressed understanding and agreed to proceed.  Follow Up Instructions:   I discussed the assessment and treatment plan with the patient. The patient was provided an opportunity to ask questions and all were answered. The patient agreed with the plan and demonstrated an understanding of the instructions.   The patient was advised to call back or seek an in-person evaluation if the symptoms worsen or if the condition fails to improve as anticipated.  I provided 25 minutes of non-face-to-face time during this encounter.  Meta Hatchet, PA   03/17/2023 1:15 PM [name]  MRN:  [id number]  Chief Complaint:  Chief Complaint  Patient presents with   Follow-up   Medication Refill   HPI:   [name] [name]. Salyards is a 67 year old female with a past psychiatric history significant for insomnia, generalized anxiety disorder, and major depressive disorder who presents to [facility] via virtual video visit for follow-up and medication management.  Patient is currently being managed on the following medications:  Doxepin (Sinequan) 50 mg at bedtime Buspirone 15 mg 3 times daily in the morning/1 tablet at noon/1 tablet in the evening  Escitalopram 30 mg daily Clonazepam 0.5 mg 2 times daily as needed Mirtazapine 30 mg at bedtime  Patient reports no issues or concerns regarding her current medication regimen.  Patient denies experiencing any adverse side effects and denies the need for dosage adjustments at this time.  Patient states that she  occasionally experiences waking up in the middle of the night.  She also continues to endorse depressive symptoms attributed to living alone and being bored.  Patient rates her depression as 7 out of 10 with 10 being most severe.  Patient endorses depressive episodes every day and states that she often has to do things to keep herself busy to improve her mood.  Patient also attributes her depressive symptoms to being legally blind and unable to drive.  Patient endorses the following depressive symptoms: feelings of sadness, lack of motivation, excessive worrying, decreased concentration, and irritability.  Patient denies feelings of worthlessness/guilt or hopelessness.  Patient continues to endorse some anxiety.  A PHQ-9 screen was performed with the patient scoring an 8.  A GAD-7 screen was also performed with the patient scoring a 9.  Patient is alert and oriented x4, pleasant, calm, cooperative, and fully engaged in conversation during the encounter.  Patient endorses okay mood.  Patient denies suicidal or homicidal ideations.  She further denies auditory or visual hallucinations and does not appear to be responding to internal/external stimuli.  Patient endorses fair sleep and received an average of 6 hours of intermittent sleep.  Patient endorses good appetite and eats on average 3 meals per day.  Patient denies alcohol consumption or illicit drug use.  Patient endorses tobacco use and smokes on average a pack per day.  Visit Diagnosis:    ICD-10-CM   1. Major depressive disorder, recurrent, in full remission with anxious distress (HCC)  F33.42 doxepin (SINEQUAN) 75 MG capsule    mirtazapine (REMERON) 30 MG tablet    escitalopram (LEXAPRO)  20 MG tablet    busPIRone (BUSPAR) 15 MG tablet    2. GAD (generalized anxiety disorder)  F41.1 mirtazapine (REMERON) 30 MG tablet    escitalopram (LEXAPRO) 20 MG tablet    busPIRone (BUSPAR) 15 MG tablet    clonazePAM (KLONOPIN) 0.5 MG tablet      Past  Psychiatric History:  Insomnia Major depressive disorder Generalized anxiety disorder  Past Medical History:  Past Medical History:  Diagnosis Date   Allergy    seasonal   Anemia    Anxiety    takes Lexapro daily   Arthritis    "back, from neck down pass my bra area" (03/25/2017)   Asthma    Bartholin gland cyst 08/29/2011   Bruises easily    pt is on Effient   Chronic back pain    herniated nucleus pulposus   Chronic back pain    "neck to bra area; lower back" (03/25/2017)   Chronic kidney disease    recurrent UTI's this year 2022   COPD (chronic obstructive pulmonary disease) (HCC)    early stages   Coronary artery disease    Depression    takes Klonopin daily   Diabetes mellitus without complication (HCC)    Diverticulosis    Fibroadenoma of left breast    GERD (gastroesophageal reflux disease)    takes Nexium daily   H/O hiatal hernia    Heart attack (HCC) 2011   Hemorrhoids    Hernia    Hyperlipidemia    takes Lipitor daily   Hypertension    takes Losartan daily and Labetalol bid   Hypothyroidism    takes Synthroid daily   Insomnia    hydroxyzine prn   Joint pain    Pneumonia    "couple times" (03/25/2017)   Pre-diabetes    "just found out 1 wk ago" (03/25/2017)   Psoriasis    elbows,knees,back   Shortness of breath    with exertion   Slowing of urinary stream    Stress incontinence     Past Surgical History:  Procedure Laterality Date   ABDOMINAL EXPLORATION SURGERY  1977   ABDOMINAL HYSTERECTOMY  1977   "left one of my ovaries"   BACK SURGERY     BIOPSY  05/27/2019   Procedure: BIOPSY;  Surgeon: Corbin Ade, MD;  Location: [facility];  Service: Endoscopy;;   BREAST BIOPSY Left 11/2021   x2   COLONOSCOPY  2011   Dr. Christella Hartigan: Mild diverticulosis, descending diminutive colon polyp (not retrieved), next colonoscopy 10 years   CORONARY ANGIOPLASTY WITH STENT PLACEMENT  2011 X2   "regular stents didn't work; had to go back in in ~ 1 month and  put in medicated stents"   DILATION AND CURETTAGE OF UTERUS     ESOPHAGOGASTRODUODENOSCOPY     ESOPHAGOGASTRODUODENOSCOPY (EGD) WITH PROPOFOL N/A 05/27/2019   normal esophagus, dilation, erosive gastropathy s/p biopsy, normal duodenum. Negative H.pylori.    LEFT HEART CATH AND CORONARY ANGIOGRAPHY N/A 03/26/2017   Procedure: LEFT HEART CATH AND CORONARY ANGIOGRAPHY;  Surgeon: Lyn Records, MD;  Location: [facility];  Service: Cardiovascular;  Laterality: N/A;   LUMBAR LAMINECTOMY/DECOMPRESSION MICRODISCECTOMY  08/05/2011   Procedure: LUMBAR LAMINECTOMY/DECOMPRESSION MICRODISCECTOMY;  Surgeon: Clydene Fake, MD;  Location: [facility]UR[facility];  Service: Neurosurgery;  Laterality: Right;  Right Lumbar four-five extraforaminal discectomy   MALONEY DILATION N/A 05/27/2019   Procedure: Elease Hashimoto DILATION;  Surgeon: Corbin Ade, MD;  Location: [facility];  Service: Endoscopy;  Laterality: N/A;  54   SHOULDER ARTHROSCOPY WITH ROTATOR CUFF REPAIR AND OPEN BICEPS TENODESIS Right 12/24/2021   Procedure: RIGHT SHOULDER ARTHROSCOPY WITH ROTATOR CUFF REPAIR AND BICEPS TENODESIS;  Surgeon: Eldred Manges, MD;  Location: [facility] OR;  Service: Orthopedics;  Laterality: Right;   TONSILLECTOMY     as a child   UPPER GASTROINTESTINAL ENDOSCOPY      Family Psychiatric History:  See intake H&P for full details. Reviewed, with no updates at this time.  Family History:  Family History  Problem Relation Age of Onset   Heart disease Mother    Hypertension Mother    Stroke Mother    Mental illness Mother    Heart disease Father    Hypertension Father    Diabetes Father    Colon polyps Brother    Heart disease Maternal Grandfather    Cancer Paternal Grandmother        mouth   Anesthesia problems Daughter    Breast cancer Maternal Aunt    Throat cancer Maternal Uncle    Thyroid disease Paternal Aunt    Hypotension Neg Hx    Malignant hyperthermia Neg Hx    Pseudochol deficiency Neg Hx    Colon  cancer Neg Hx    Stomach cancer Neg Hx    Esophageal cancer Neg Hx    Pancreatic cancer Neg Hx    Rectal cancer Neg Hx     Social History:  Social History   Socioeconomic History   Marital status: Divorced    Spouse name: Not on file   Number of children: 1   Years of education: Not on file   Highest education level: 9th grade  Occupational History   Not on file  Tobacco Use   Smoking status: Every Day    Current packs/day: 0.50    Average packs/day: 0.5 packs/day for 50.0 years (25.0 ttl pk-yrs)    Types: Cigarettes   Smokeless tobacco: Never  Vaping Use   Vaping status: Some Days   Substances: Nicotine  Substance and Sexual Activity   Alcohol use: Yes    Comment: once a week   Drug use: Yes    Types: Marijuana    Comment: history of cocaine use, been about a year, per patient (12/18/21)   Sexual activity: Not Currently    Birth control/protection: Surgical  Other Topics Concern   Not on file  Social History Narrative   Not on file   Social Determinants of Health   Financial Resource Strain: Low Risk  (12/24/2022)   Overall Financial Resource Strain (CARDIA)    Difficulty of Paying Living Expenses: Not hard at all  Food Insecurity: No Food Insecurity (12/24/2022)   Hunger Vital Sign    Worried About Running Out of Food in the Last Year: Never true    Ran Out of Food in the Last Year: Never true  Transportation Needs: No Transportation Needs (12/24/2022)   PRAPARE - Administrator, Civil Service (Medical): No    Lack of Transportation (Non-Medical): No  Physical Activity: Inactive (12/24/2022)   Exercise Vital Sign    Days of Exercise per Week: 0 days    Minutes of Exercise per Session: 0 min  Stress: No Stress Concern Present (12/24/2022)   [employer] of Occupational Health - Occupational Stress Questionnaire    Feeling of Stress : Only a little  Social Connections: Moderately Isolated (12/24/2022)   Social Connection and Isolation Panel  [NHANES]    Frequency of  Communication with Friends and Family: More than three times a week    Frequency of Social Gatherings with Friends and Family: Three times a week    Attends Religious Services: 1 to 4 times per year    Active Member of Clubs or Organizations: No    Attends Banker Meetings: Never    Marital Status: Divorced    Allergies:  Allergies  Allergen Reactions   Avocado Anaphylaxis   Latex Shortness Of Breath and Rash   Codeine Nausea Only    Metabolic Disorder Labs: Lab Results  Component Value Date   HGBA1C 5.7 01/17/2023   MPG 105.41 12/18/2021   MPG 119.76 10/05/2021   No results found for: "PROLACTIN" Lab Results  Component Value Date   CHOL 229 (H) 03/21/2022   TRIG 99 03/21/2022   HDL 76 03/21/2022   CHOLHDL 3.0 03/21/2022   VLDL 15 04/16/2016   LDLCALC 136 (H) 03/21/2022   LDLCALC 69 11/24/2020   Lab Results  Component Value Date   TSH 3.180 09/10/2022   TSH 0.502 10/25/2021    Therapeutic Level Labs: No results found for: "LITHIUM" No results found for: "VALPROATE" No results found for: "CBMZ"  Current Medications: Current Outpatient Medications  Medication Sig Dispense Refill   alendronate (FOSAMAX) 35 MG tablet Take 1 tablet (35 mg total) by mouth every 7 (seven) days. Take with a full glass of water on an empty stomach. Sit upright for 30-60 mins after taking 12 tablet 1   albuterol (VENTOLIN HFA) 108 (90 Base) MCG/ACT inhaler Inhale 2 puffs into the lungs every 6 (six) hours as needed for wheezing or shortness of breath. 18 g 12   amLODipine (NORVASC) 5 MG tablet Take 1 tablet (5 mg total) by mouth daily. 90 tablet 3   aspirin EC 81 MG tablet Take 81 mg by mouth daily.     atorvastatin (LIPITOR) 80 MG tablet Take 1 tablet (80 mg total) by mouth daily, please schedule appointment for more refills 90 tablet 3   Blood Glucose Monitoring Suppl (TRUE METRIX METER) DEVI 1 kit by Does not apply route 4 (four) times daily. 1  Device 0   busPIRone (BUSPAR) 15 MG tablet Take 2 tablets (30 mg total) by mouth in the morning AND 1 tablet (15 mg total) daily at 12 noon AND 1 tablet (15 mg total) every evening. 120 tablet 3   clonazePAM (KLONOPIN) 0.5 MG tablet Take 1 tablet (0.5 mg total) by mouth 2 (two) times daily as needed for anxiety. 60 tablet 1   doxepin (SINEQUAN) 75 MG capsule Take 1 capsule (75 mg total) by mouth at bedtime. 30 capsule 3   escitalopram (LEXAPRO) 20 MG tablet Take 1 tablet (20 mg total) by mouth daily. 30 tablet 3   fluticasone (FLONASE) 50 MCG/ACT nasal spray Place 2 sprays into both nostrils daily. 16 g 0   Fluticasone-Umeclidin-Vilant (TRELEGY ELLIPTA) 100-62.5-25 MCG/ACT AEPB Inhale 1 puff into the lungs daily. 60 each 2   furosemide (LASIX) 20 MG tablet Take 1 tablet (20 mg total) by mouth daily. 90 tablet 3   gabapentin (NEURONTIN) 600 MG tablet TAKE 1 TABLET BY MOUTH 4 TIMES DAILY 360 tablet 2   glucose blood (TRUE METRIX BLOOD GLUCOSE TEST) test strip Use as instructed 100 each 12   ipratropium-albuterol (DUONEB) 0.5-2.5 (3) MG/3ML SOLN TAKE 3 MLS VIA NEBULIZATION EVERY 6 HOURS 360 mL 1   levothyroxine (SYNTHROID) 112 MCG tablet Take 1 tablet (112 mcg total) by mouth daily.  90 tablet 2   loperamide (IMODIUM A-D) 2 MG tablet Take 4 mg by mouth 4 (four) times daily as needed for diarrhea or loose stools.     losartan (COZAAR) 50 MG tablet Take 1.5 tablets (75 mg total) by mouth daily. 135 tablet 3   metFORMIN (GLUCOPHAGE) 500 MG tablet TAKE 1 TABLET (500 MG TOTAL) BY MOUTH 2 (TWO) TIMES DAILY WITH A MEAL. 180 tablet 1   mirtazapine (REMERON) 30 MG tablet Take 1 tablet (30 mg total) by mouth at bedtime. 30 tablet 3   montelukast (SINGULAIR) 10 MG tablet Take 1 tablet (10 mg total) by mouth at bedtime. 90 tablet 2   nebivolol (BYSTOLIC) 10 MG tablet Take 1 tablet (10 mg total) by mouth daily. 90 tablet 3   nitroGLYCERIN (NITROSTAT) 0.4 MG SL tablet Place 1 tablet (0.4 mg total) under the tongue  every 5 (five) minutes as needed for chest pain. 25 tablet 3   omeprazole (PRILOSEC) 40 MG capsule Take 1 capsule (40 mg total) by mouth 2 (two) times daily before a meal. 60 capsule 5   sodium chloride (OCEAN) 0.65 % SOLN nasal spray Place 1 spray into both nostrils as needed for congestion.     Spacer/Aero-Holding Chambers (AEROCHAMBER MV) inhaler Use as instructed 1 each 1   ticagrelor (BRILINTA) 60 MG TABS tablet Take 1 tablet (60 mg total) by mouth 2 (two) times daily. 60 tablet 52   triamcinolone (KENALOG) 0.025 % ointment Apply 1 Application topically 2 (two) times daily as needed. 80 g 0   TRUEPLUS LANCETS 28G MISC 28 g by Does not apply route QID. 120 each 2   No current facility-administered medications for this visit.     Musculoskeletal: Strength & Muscle Tone: Unable to assess due to telemedicine visit Gait & Station: Unable to assess due to telemedicine visit Patient leans: Unable to assess due to telemedicine visit  Psychiatric Specialty Exam: Review of Systems  Psychiatric/Behavioral:  Positive for sleep disturbance. Negative for decreased concentration, dysphoric mood, hallucinations, self-injury and suicidal ideas. The patient is nervous/anxious. The patient is not hyperactive.     There were no vitals taken for this visit.There is no height or weight on file to calculate BMI.  General Appearance: Casual  Eye Contact:  Good  Speech:  Clear and Coherent and Normal Rate  Volume:  Normal  Mood:  Anxious and Depressed  Affect:  Appropriate  Thought Process:  Coherent, Goal Directed, and Descriptions of Associations: Intact  Orientation:  Full (Time, Place, and Person)  Thought Content: WDL   Suicidal Thoughts:  No  Homicidal Thoughts:  No  Memory:  Immediate;   Good Recent;   Good Remote;   Good  Judgement:  Fair  Insight:  Fair  Psychomotor Activity:  Normal  Concentration:  Concentration: Good and Attention Span: Good  Recall:  [employer] of Knowledge: Fair   Language: Fair  Akathisia:  Negative  Handed:  Right  AIMS (if indicated): not done  Assets:  Communication Skills Desire for Improvement Housing  ADL's:  Intact  Cognition: WNL  Sleep:  Fair   Screenings: AUDIT    Flowsheet Row [facility]  Alcohol Use Disorder Identification Test Final Score (AUDIT) 12      GAD-7    Flowsheet Row Video Visit from 03/14/2023 in [facility] Office Visit from 01/17/2023 in [facility] Video Visit from 12/13/2022  in [facility] Video Visit from 09/20/2022 in [facility] Office Visit from 09/10/2022 in [facility]  Total GAD-7 Score 9 0 3 8 0      PHQ2-9    Flowsheet Row Video Visit from 03/14/2023 in [facility] Office Visit from 01/17/2023 in [facility] Clinical Support from 12/24/2022 in [facility] Video Visit from 12/13/2022 in [facility] Video Visit from 09/20/2022 in [facility]  PHQ-2 Total Score 2 0 0 0 1  PHQ-9 Total Score 8 1 -- -- --      Flowsheet Row Video Visit from 03/14/2023 in [facility] Video Visit from 12/13/2022 in [facility] Video Visit from 09/20/2022 in [facility]  C-SSRS RISK CATEGORY No Risk No Risk No Risk        Assessment and Plan:   Sheketa Guthery. Gowans is a 67 year old female with a past psychiatric history significant for insomnia, generalized anxiety disorder, and major depressive disorder who presents to [facility] via virtual video visit for follow-up and medication management.  Patient presents to encounter  continuing to endorse depressive symptoms and anxiety attributed to being alone and bored.  Although patient continues to endorse depressive symptoms and anxiety, she endorses being mostly stable on her current medication regimen and denies the need for dosage adjustments at this time.  Patient to continue taking medications as prescribed.  Patient's medications to be e-prescribed to pharmacy of choice.  Collaboration of Care: Collaboration of Care: Medication Management AEB patient's medications being managed by this provider and Psychiatrist AEB patient being managed by this behavioral health provider  Patient/Guardian was advised Release of Information must be obtained prior to any record release in order to collaborate their care with an outside provider. Patient/Guardian was advised if they have not already done so to contact the registration department to sign all necessary forms in order for [place] to release information regarding their care.   Consent: Patient/Guardian gives verbal consent for treatment and assignment of benefits for services provided during this visit. Patient/Guardian expressed understanding and agreed to proceed.   1. Major depressive disorder, recurrent, in full remission with anxious distress (HCC)  - doxepin (SINEQUAN) 75 MG capsule; Take 1 capsule (75 mg total) by mouth at bedtime.  Dispense: 30 capsule; Refill: 3 - mirtazapine (REMERON) 30 MG tablet; Take 1 tablet (30 mg total) by mouth at bedtime.  Dispense: 30 tablet; Refill: 3 - escitalopram (LEXAPRO) 20 MG tablet; Take 1 tablet (20 mg total) by mouth daily.  Dispense: 30 tablet; Refill: 3 - busPIRone (BUSPAR) 15 MG tablet; Take 2 tablets (30 mg total) by mouth in the morning AND 1 tablet (15 mg total) daily at 12 noon AND 1 tablet (15 mg total) every evening.  Dispense: 120 tablet; Refill: 3  2. GAD (generalized anxiety disorder)  - mirtazapine (REMERON) 30 MG tablet; Take 1 tablet (30 mg total) by mouth at bedtime.   Dispense: 30 tablet; Refill: 3 - escitalopram (LEXAPRO) 20 MG tablet; Take 1 tablet (20 mg total) by mouth daily.  Dispense: 30 tablet; Refill: 3 - busPIRone (BUSPAR) 15 MG tablet; Take 2 tablets (30 mg total) by mouth in the morning AND 1 tablet (15 mg total) daily at 12 noon AND 1 tablet (15 mg total)  every evening.  Dispense: 120 tablet; Refill: 3 - clonazePAM (KLONOPIN) 0.5 MG tablet; Take 1 tablet (0.5 mg total) by mouth 2 (two) times daily as needed for anxiety.  Dispense: 60 tablet; Refill: 1  Patient to follow up in 3 months Provider spent a total of 25 minutes with the patient/reviewing patient's chart  Meta Hatchet, PA 03/17/2023, 1:15 PM

## 2023-03-18 ENCOUNTER — Encounter: Payer: Self-pay | Admitting: Hematology

## 2023-03-18 ENCOUNTER — Other Ambulatory Visit: Payer: Self-pay

## 2023-03-21 ENCOUNTER — Other Ambulatory Visit: Payer: Self-pay

## 2023-03-21 DIAGNOSIS — D509 Iron deficiency anemia, unspecified: Secondary | ICD-10-CM

## 2023-03-24 ENCOUNTER — Inpatient Hospital Stay: Payer: Medicare HMO | Attending: Hematology

## 2023-03-24 ENCOUNTER — Inpatient Hospital Stay (HOSPITAL_BASED_OUTPATIENT_CLINIC_OR_DEPARTMENT_OTHER): Payer: Medicare HMO | Admitting: Hematology

## 2023-03-24 VITALS — BP 132/66 | HR 64 | Temp 98.1°F | Resp 17 | Wt 144.1 lb

## 2023-03-24 DIAGNOSIS — I2510 Atherosclerotic heart disease of native coronary artery without angina pectoris: Secondary | ICD-10-CM | POA: Diagnosis not present

## 2023-03-24 DIAGNOSIS — D75839 Thrombocytosis, unspecified: Secondary | ICD-10-CM | POA: Diagnosis not present

## 2023-03-24 DIAGNOSIS — Z801 Family history of malignant neoplasm of trachea, bronchus and lung: Secondary | ICD-10-CM | POA: Diagnosis not present

## 2023-03-24 DIAGNOSIS — D696 Thrombocytopenia, unspecified: Secondary | ICD-10-CM | POA: Insufficient documentation

## 2023-03-24 DIAGNOSIS — J4489 Other specified chronic obstructive pulmonary disease: Secondary | ICD-10-CM | POA: Diagnosis not present

## 2023-03-24 DIAGNOSIS — E039 Hypothyroidism, unspecified: Secondary | ICD-10-CM | POA: Diagnosis not present

## 2023-03-24 DIAGNOSIS — Z79890 Hormone replacement therapy (postmenopausal): Secondary | ICD-10-CM | POA: Diagnosis not present

## 2023-03-24 DIAGNOSIS — Z7984 Long term (current) use of oral hypoglycemic drugs: Secondary | ICD-10-CM | POA: Insufficient documentation

## 2023-03-24 DIAGNOSIS — I129 Hypertensive chronic kidney disease with stage 1 through stage 4 chronic kidney disease, or unspecified chronic kidney disease: Secondary | ICD-10-CM | POA: Diagnosis not present

## 2023-03-24 DIAGNOSIS — Z86018 Personal history of other benign neoplasm: Secondary | ICD-10-CM | POA: Diagnosis not present

## 2023-03-24 DIAGNOSIS — Z79899 Other long term (current) drug therapy: Secondary | ICD-10-CM | POA: Diagnosis not present

## 2023-03-24 DIAGNOSIS — Z803 Family history of malignant neoplasm of breast: Secondary | ICD-10-CM | POA: Diagnosis not present

## 2023-03-24 DIAGNOSIS — K219 Gastro-esophageal reflux disease without esophagitis: Secondary | ICD-10-CM | POA: Insufficient documentation

## 2023-03-24 DIAGNOSIS — G8929 Other chronic pain: Secondary | ICD-10-CM | POA: Insufficient documentation

## 2023-03-24 DIAGNOSIS — E1122 Type 2 diabetes mellitus with diabetic chronic kidney disease: Secondary | ICD-10-CM | POA: Insufficient documentation

## 2023-03-24 DIAGNOSIS — D509 Iron deficiency anemia, unspecified: Secondary | ICD-10-CM

## 2023-03-24 DIAGNOSIS — F17210 Nicotine dependence, cigarettes, uncomplicated: Secondary | ICD-10-CM | POA: Insufficient documentation

## 2023-03-24 DIAGNOSIS — E785 Hyperlipidemia, unspecified: Secondary | ICD-10-CM | POA: Insufficient documentation

## 2023-03-24 DIAGNOSIS — Z7902 Long term (current) use of antithrombotics/antiplatelets: Secondary | ICD-10-CM | POA: Diagnosis not present

## 2023-03-24 DIAGNOSIS — Z7982 Long term (current) use of aspirin: Secondary | ICD-10-CM | POA: Insufficient documentation

## 2023-03-24 DIAGNOSIS — N189 Chronic kidney disease, unspecified: Secondary | ICD-10-CM | POA: Insufficient documentation

## 2023-03-24 DIAGNOSIS — R109 Unspecified abdominal pain: Secondary | ICD-10-CM | POA: Insufficient documentation

## 2023-03-24 LAB — CBC WITH DIFFERENTIAL (CANCER CENTER ONLY)
Abs Immature Granulocytes: 0.03 10*3/uL (ref 0.00–0.07)
Basophils Absolute: 0 10*3/uL (ref 0.0–0.1)
Basophils Relative: 0 %
Eosinophils Absolute: 0.1 10*3/uL (ref 0.0–0.5)
Eosinophils Relative: 1 %
HCT: 47.5 % — ABNORMAL HIGH (ref 36.0–46.0)
Hemoglobin: 16.1 g/dL — ABNORMAL HIGH (ref 12.0–15.0)
Immature Granulocytes: 0 %
Lymphocytes Relative: 24 %
Lymphs Abs: 2.2 10*3/uL (ref 0.7–4.0)
MCH: 32.8 pg (ref 26.0–34.0)
MCHC: 33.9 g/dL (ref 30.0–36.0)
MCV: 96.7 fL (ref 80.0–100.0)
Monocytes Absolute: 0.8 10*3/uL (ref 0.1–1.0)
Monocytes Relative: 9 %
Neutro Abs: 5.8 10*3/uL (ref 1.7–7.7)
Neutrophils Relative %: 66 %
Platelet Count: 426 10*3/uL — ABNORMAL HIGH (ref 150–400)
RBC: 4.91 MIL/uL (ref 3.87–5.11)
RDW: 14.1 % (ref 11.5–15.5)
WBC Count: 9 10*3/uL (ref 4.0–10.5)
nRBC: 0 % (ref 0.0–0.2)

## 2023-03-24 LAB — CMP (CANCER CENTER ONLY)
ALT: 23 U/L (ref 0–44)
AST: 19 U/L (ref 15–41)
Albumin: 4.4 g/dL (ref 3.5–5.0)
Alkaline Phosphatase: 103 U/L (ref 38–126)
Anion gap: 7 (ref 5–15)
BUN: 10 mg/dL (ref 8–23)
CO2: 28 mmol/L (ref 22–32)
Calcium: 9.3 mg/dL (ref 8.9–10.3)
Chloride: 103 mmol/L (ref 98–111)
Creatinine: 0.8 mg/dL (ref 0.44–1.00)
GFR, Estimated: >60 mL/min (ref >60)
Glucose, Bld: 84 mg/dL (ref 70–99)
Potassium: 3.5 mmol/L (ref 3.5–5.1)
Sodium: 138 mmol/L (ref 135–145)
Total Bilirubin: 0.4 mg/dL (ref 0.3–1.2)
Total Protein: 7.2 g/dL (ref 6.5–8.1)

## 2023-03-24 LAB — IRON AND IRON BINDING CAPACITY (CC-WL,HP ONLY)
Iron: 79 ug/dL (ref 28–170)
Saturation Ratios: 21 % (ref 10.4–31.8)
TIBC: 372 ug/dL (ref 250–450)
UIBC: 293 ug/dL (ref 148–442)

## 2023-03-24 LAB — FERRITIN: Ferritin: 81 ng/mL (ref 11–307)

## 2023-03-24 NOTE — Progress Notes (Signed)
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   Patient was last seen by me on 11/20/2022 and she complained of couple occasional episodes of dark stools and mild upper abdominal pain.   Patient is accompanied by her daughter during this visit. Patient complains of increased fatigue and tiredness during this visit. Patient is still smoking cigarettes, around 1 pack a day.   She denies any new infection issues, fever, chills, night sweats, abnormal bowel movement, abdominal pain, chest pain, back pain, or leg swelling.   She has been taking calcium and Vitamin-D supplement. She denies taking iron supplement.  Patient regularly follows-up with her PCP.    MEDICAL HISTORY:  Past Medical History:  Diagnosis Date   Allergy    seasonal   Anemia    Anxiety    takes Lexapro daily   Arthritis    "back, from neck down pass my bra area" (03/25/2017)   Asthma    Bartholin gland cyst 08/29/2011   Bruises easily    pt is on Effient   Chronic back pain    herniated nucleus pulposus   Chronic back pain    "neck to bra area; lower back" (03/25/2017)   Chronic kidney disease    recurrent UTI's this year 2022   COPD (chronic obstructive pulmonary disease) (HCC)    early stages   Coronary artery disease    Depression    takes Klonopin  daily   Diabetes mellitus without complication (HCC)    Diverticulosis    Fibroadenoma of left breast    GERD (gastroesophageal reflux disease)    takes Nexium daily   H/O hiatal hernia    Heart attack (HCC) 2011   Hemorrhoids    Hernia    Hyperlipidemia    takes Lipitor daily   Hypertension    takes Losartan daily and Labetalol bid   Hypothyroidism    takes Synthroid daily   Insomnia    hydroxyzine prn   Joint pain    Pneumonia    "couple times" (03/25/2017)   Pre-diabetes    "just found out 1 wk ago" (03/25/2017)   Psoriasis    elbows,knees,back   Shortness of breath    with exertion   Slowing of urinary stream    Stress incontinence     SURGICAL HISTORY: Past Surgical History:  Procedure Laterality Date   ABDOMINAL EXPLORATION SURGERY  1977   ABDOMINAL HYSTERECTOMY  1977   "left one of my ovaries"   BACK SURGERY     BIOPSY  05/27/2019   Procedure: BIOPSY;  Surgeon: Corbin Ade, MD;  Location: [facility];  Service: Endoscopy;;   BREAST BIOPSY Left 11/2021   x2   COLONOSCOPY  2011   Dr. Christella Hartigan: Mild diverticulosis, descending diminutive colon polyp (not retrieved), next colonoscopy 10 years   CORONARY ANGIOPLASTY WITH STENT PLACEMENT  2011 X2   "regular stents didn't work; had  to go back in in ~ 1 month and put in medicated stents"   DILATION AND CURETTAGE OF UTERUS     ESOPHAGOGASTRODUODENOSCOPY     ESOPHAGOGASTRODUODENOSCOPY (EGD) WITH PROPOFOL N/A 05/27/2019   normal esophagus, dilation, erosive gastropathy s/p biopsy, normal duodenum. Negative H.pylori.    LEFT HEART CATH AND CORONARY ANGIOGRAPHY N/A 03/26/2017   Procedure: LEFT HEART CATH AND CORONARY ANGIOGRAPHY;  Surgeon: Lyn Records, MD;  Location: [facility];  Service: Cardiovascular;  Laterality: N/A;   LUMBAR LAMINECTOMY/DECOMPRESSION MICRODISCECTOMY  08/05/2011   Procedure: LUMBAR LAMINECTOMY/DECOMPRESSION MICRODISCECTOMY;  Surgeon: Clydene Fake, MD;  Location: [facility];   Service: Neurosurgery;  Laterality: Right;  Right Lumbar four-five extraforaminal discectomy   MALONEY DILATION N/A 05/27/2019   Procedure: Elease Hashimoto DILATION;  Surgeon: Corbin Ade, MD;  Location: [facility];  Service: Endoscopy;  Laterality: N/A;  54   SHOULDER ARTHROSCOPY WITH ROTATOR CUFF REPAIR AND OPEN BICEPS TENODESIS Right 12/24/2021   Procedure: RIGHT SHOULDER ARTHROSCOPY WITH ROTATOR CUFF REPAIR AND BICEPS TENODESIS;  Surgeon: Eldred Manges, MD;  Location: [facility];  Service: Orthopedics;  Laterality: Right;   TONSILLECTOMY     as a child   UPPER GASTROINTESTINAL ENDOSCOPY      SOCIAL HISTORY: Social History   Socioeconomic History   Marital status: Divorced    Spouse name: Not on file   Number of children: 1   Years of education: Not on file   Highest education level: 9th grade  Occupational History   Not on file  Tobacco Use   Smoking status: Every Day    Current packs/day: 0.50    Average packs/day: 0.5 packs/day for 50.0 years (25.0 ttl pk-yrs)    Types: Cigarettes   Smokeless tobacco: Never  Vaping Use   Vaping status: Some Days   Substances: Nicotine  Substance and Sexual Activity   Alcohol use: Yes    Comment: once a week   Drug use: Yes    Types: Marijuana    Comment: history of cocaine use, been about a year, per patient (12/18/21)   Sexual activity: Not Currently    Birth control/protection: Surgical  Other Topics Concern   Not on file  Social History Narrative   Not on file   Social Determinants of Health   Financial Resource Strain: Low Risk  (12/24/2022)   Overall Financial Resource Strain (CARDIA)    Difficulty of Paying Living Expenses: Not hard at all  Food Insecurity: No Food Insecurity (12/24/2022)   Hunger Vital Sign    Worried About Running Out of Food in the Last Year: Never true    Ran Out of Food in the Last Year: Never true  Transportation Needs: No Transportation Needs (12/24/2022)   PRAPARE - Scientist, research (physical sciences) (Medical): No    Lack of Transportation (Non-Medical): No  Physical Activity: Inactive (12/24/2022)   Exercise Vital Sign    Days of Exercise per Week: 0 days    Minutes of Exercise per Session: 0 min  Stress: No Stress Concern Present (12/24/2022)   [employer] of Occupational Health - Occupational Stress Questionnaire    Feeling of Stress : Only a little  Social Connections: Moderately Isolated (12/24/2022)   Social Connection and Isolation Panel [NHANES]    Frequency of Communication with Friends and Family: More than three times a week    Frequency of Social Gatherings with Friends and Family: Three times a week    Attends Religious  Services: 1 to 4 times per year    Active Member of Clubs or Organizations: No    Attends Banker Meetings: Never    Marital Status: Divorced  Catering manager Violence: Not At Risk (12/24/2022)   Humiliation, Afraid, Rape, and Kick questionnaire    Fear of Current or Ex-Partner: No    Emotionally Abused: No    Physically Abused: No    Sexually Abused: No    FAMILY HISTORY: Family History  Problem Relation Age of Onset   Heart disease Mother    Hypertension Mother    Stroke Mother    Mental illness Mother    Heart disease Father    Hypertension Father    Diabetes Father    Colon polyps Brother    Heart disease Maternal Grandfather    Cancer Paternal Grandmother        mouth   Anesthesia problems Daughter    Breast cancer Maternal Aunt    Throat cancer Maternal Uncle    Thyroid disease Paternal Aunt    Hypotension Neg Hx    Malignant hyperthermia Neg Hx    Pseudochol deficiency Neg Hx    Colon cancer Neg Hx    Stomach cancer Neg Hx    Esophageal cancer Neg Hx    Pancreatic cancer Neg Hx    Rectal cancer Neg Hx     ALLERGIES:  is allergic to avocado, latex, and codeine.  MEDICATIONS:  Current Outpatient Medications  Medication Sig Dispense Refill   alendronate (FOSAMAX) 35 MG tablet Take 1 tablet  (35 mg total) by mouth every 7 (seven) days. Take with a full glass of water on an empty stomach. Sit upright for 30-60 mins after taking 12 tablet 1   albuterol (VENTOLIN HFA) 108 (90 Base) MCG/ACT inhaler Inhale 2 puffs into the lungs every 6 (six) hours as needed for wheezing or shortness of breath. 18 g 12   amLODipine (NORVASC) 5 MG tablet Take 1 tablet (5 mg total) by mouth daily. 90 tablet 3   aspirin EC 81 MG tablet Take 81 mg by mouth daily.     atorvastatin (LIPITOR) 80 MG tablet Take 1 tablet (80 mg total) by mouth daily, please schedule appointment for more refills 90 tablet 3   Blood Glucose Monitoring Suppl (TRUE METRIX METER) DEVI 1 kit by Does not apply route 4 (four) times daily. 1 Device 0   busPIRone (BUSPAR) 15 MG tablet Take 2 tablets (30 mg total) by mouth in the morning AND 1 tablet (15 mg total) daily at 12 noon AND 1 tablet (15 mg total) every evening. 120 tablet 3   clonazePAM (KLONOPIN) 0.5 MG tablet Take 1 tablet (0.5 mg total) by mouth 2 (two) times daily as needed for anxiety. 60 tablet 1   doxepin (SINEQUAN) 75 MG capsule Take 1 capsule (75 mg total) by mouth at bedtime. 30 capsule 3   escitalopram (LEXAPRO) 20 MG tablet Take 1 tablet (20 mg total) by mouth daily. 30 tablet 3   fluticasone (FLONASE) 50 MCG/ACT nasal spray Place 2 sprays into both nostrils daily. 16 g 0   Fluticasone-Umeclidin-Vilant (TRELEGY ELLIPTA) 100-62.5-25 MCG/ACT AEPB Inhale 1 puff into the lungs daily. 60 each 2   furosemide (LASIX) 20 MG tablet Take 1 tablet (20 mg total) by mouth daily. 90 tablet 3   gabapentin (NEURONTIN) 600 MG tablet TAKE 1 TABLET BY MOUTH 4 TIMES DAILY 360 tablet 2   glucose blood (TRUE METRIX BLOOD GLUCOSE TEST) test strip  Use as instructed 100 each 12   ipratropium-albuterol (DUONEB) 0.5-2.5 (3) MG/3ML SOLN TAKE 3 MLS VIA NEBULIZATION EVERY 6 HOURS 360 mL 1   levothyroxine (SYNTHROID) 112 MCG tablet Take 1 tablet (112 mcg total) by mouth daily. 90 tablet 2   loperamide  (IMODIUM A-D) 2 MG tablet Take 4 mg by mouth 4 (four) times daily as needed for diarrhea or loose stools.     losartan (COZAAR) 50 MG tablet Take 1.5 tablets (75 mg total) by mouth daily. 135 tablet 3   metFORMIN (GLUCOPHAGE) 500 MG tablet TAKE 1 TABLET (500 MG TOTAL) BY MOUTH 2 (TWO) TIMES DAILY WITH A MEAL. 180 tablet 1   mirtazapine (REMERON) 30 MG tablet Take 1 tablet (30 mg total) by mouth at bedtime. 30 tablet 3   montelukast (SINGULAIR) 10 MG tablet Take 1 tablet (10 mg total) by mouth at bedtime. 90 tablet 2   nebivolol (BYSTOLIC) 10 MG tablet Take 1 tablet (10 mg total) by mouth daily. 90 tablet 3   nitroGLYCERIN (NITROSTAT) 0.4 MG SL tablet Place 1 tablet (0.4 mg total) under the tongue every 5 (five) minutes as needed for chest pain. 25 tablet 3   omeprazole (PRILOSEC) 40 MG capsule Take 1 capsule (40 mg total) by mouth 2 (two) times daily before a meal. 60 capsule 5   sodium chloride (OCEAN) 0.65 % SOLN nasal spray Place 1 spray into both nostrils as needed for congestion.     Spacer/Aero-Holding Chambers (AEROCHAMBER MV) inhaler Use as instructed 1 each 1   ticagrelor (BRILINTA) 60 MG TABS tablet Take 1 tablet (60 mg total) by mouth 2 (two) times daily. 60 tablet 52   triamcinolone (KENALOG) 0.025 % ointment Apply 1 Application topically 2 (two) times daily as needed. 80 g 0   TRUEPLUS LANCETS 28G MISC 28 g by Does not apply route QID. 120 each 2   No current facility-administered medications for this visit.    REVIEW OF SYSTEMS:    10 Point review of Systems was done is negative except as noted above.   PHYSICAL EXAMINATION: ECOG PERFORMANCE STATUS: 2 - Symptomatic, <50% confined to bed  Vitals:   03/24/23 1330  BP: 132/66  Pulse: 64  Resp: 17  Temp: 98.1 F (36.7 C)  SpO2: 98%   Filed Weights   03/24/23 1330  Weight: 144 lb 1.6 oz (65.4 kg)  .Body mass index is 27.23 kg/m.  GENERAL:alert, in no acute distress and comfortable SKIN: no acute rashes, no significant  lesions EYES: conjunctiva are pink and non-injected, sclera anicteric OROPHARYNX: MMM, no exudates, no oropharyngeal erythema or ulceration NECK: supple, no JVD LYMPH:  no palpable lymphadenopathy in the cervical, axillary or inguinal regions LUNGS: clear to auscultation b/l with normal respiratory effort HEART: regular rate & rhythm ABDOMEN:  normoactive bowel sounds , non tender, not distended. Extremity: no pedal edema PSYCH: alert & oriented x 3 with fluent speech NEURO: no focal motor/sensory deficits   LABORATORY DATA:  I have reviewed the data as listed  .    Latest Ref Rng & Units 03/24/2023   12:32 PM 01/17/2023   11:38 AM 11/20/2022    1:31 PM  CBC  WBC 4.0 - 10.5 K/uL 9.0  9.3  10.0   Hemoglobin 12.0 - 15.0 g/dL 52.8  41.3  24.4   Hematocrit 36.0 - 46.0 % 47.5  45.8  46.6   Platelets 150 - 400 K/uL 426  420  412     .  Latest Ref Rng & Units 03/24/2023   12:32 PM 03/04/2023    3:43 PM 11/20/2022    1:31 PM  CMP  Glucose 70 - 99 mg/dL 84  74  244   BUN 8 - 23 mg/dL 10  13  13    Creatinine 0.44 - 1.00 mg/dL 0.10  2.72  5.36   Sodium 135 - 145 mmol/L 138  139  142   Potassium 3.5 - 5.1 mmol/L 3.5  4.9  4.0   Chloride 98 - 111 mmol/L 103  100  108   CO2 22 - 32 mmol/L 28  26  29    Calcium 8.9 - 10.3 mg/dL 9.3  9.7  9.8   Total Protein 6.5 - 8.1 g/dL 7.2   7.2   Total Bilirubin 0.3 - 1.2 mg/dL 0.4   0.6   Alkaline Phos 38 - 126 U/L 103   115   AST 15 - 41 U/L 19   13   ALT 0 - 44 U/L 23   14    . 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 MM 3D SCREEN BREAST  BILATERAL  Result Date: 02/26/2023 CLINICAL DATA:  Screening. EXAM: DIGITAL SCREENING BILATERAL MAMMOGRAM WITH TOMOSYNTHESIS AND CAD TECHNIQUE: Bilateral screening digital craniocaudal and mediolateral oblique mammograms were obtained. Bilateral screening digital breast tomosynthesis was performed. The images were evaluated with computer-aided detection. COMPARISON:  Previous exam(s). ACR Breast Density Category a: The breasts are almost entirely fatty. FINDINGS: There are no findings suspicious for malignancy. IMPRESSION: No mammographic evidence of malignancy. A result letter of this screening mammogram will be mailed directly to the patient. RECOMMENDATION: Screening mammogram in one year. (Code:SM-B-01Y) BI-RADS CATEGORY  1: Negative. Electronically Signed   By: Sande Brothers M.D.   On: 02/26/2023 11:08   DG Bone Density  Result Date: 02/24/2023 EXAM: DUAL X-RAY ABSORPTIOMETRY (DXA) FOR BONE MINERAL DENSITY IMPRESSION: Referring Physician:  [name] Your patient completed a bone mineral density test using GE Lunar iDXA system (analysis version: 16). Technologist:     lmn PATIENT: Name: Revella, Like Patient ID: [id number] Birth Date: [birth year] Height: 59.0 in. Sex: Female Measured: 02/24/2023 Weight: 145.0 lbs. Indications: Albuterol, Caucasian, COPD, Depression, Estrogen Deficient, Gabapentin, Hypothyroid, Hysterectomy, Klonopin, Levothyroxine, Lexapro, Omeprazole, One ovary removed, Postmenopausal, Remeron, Secondary Osteoporosis, Tobacco User (Current Smoker) Fractures: NONE Treatments: None ASSESSMENT: The BMD measured at Femur Neck Right is 0.699 g/cm2 with a T-score of -2.4. This patient is considered osteopenic/low bone mass according to World Health Organization [facility]) criteria. The quality of the exam is good. L4 was excluded due to degenerative changes. Site Region Measured Date Measured Age YA BMD Significant CHANGE T-score DualFemur Neck Right 02/24/2023    66.6         -2.4    0.699  g/cm2 [facility] Spine  L1-L3      02/24/2023    66.6         -1.6    0.978 g/cm2 DualFemur Total Mean 2/24/2023    66.6         -2.2    0.732 g/cm2  World Science writer [facility]) criteria for post-menopausal, Caucasian Women: Normal       T-score at or above -1 SD Osteopenia   T-score between -1 and -2.5 SD Osteoporosis T-score at or below -2.5 SD RECOMMENDATION: 1. All patients should optimize calcium and vitamin D intake. 2. Consider FDA-approved medical therapies in postmenopausal women and men aged 38 years and older, based on the following: a. A hip or vertebral (clinical or morphometric) fracture. b. T-score = -2.5 at the femoral neck or spine after appropriate evaluation to exclude secondary causes. c. Low bone mass (T-score between -1.0 and -2.5 at the femoral neck or spine) and a 10-year probability of a hip fracture = 3% or a 10-year probability of a major osteoporosis-related fracture = 20% based on the [place]-adapted WHO algorithm. d. Clinician judgment and/or patient preferences may indicate treatment for people with 10-year fracture probabilities above or below these levels. FOLLOW-UP: Patients with diagnosis of osteoporosis or at high risk for fracture should have regular bone mineral density tests. Patients eligible for Medicare are allowed routine testing every 2 years. The testing frequency can be increased to one year for patients who have rapidly progressing disease, are receiving or discontinuing medical therapy to restore bone mass, or have additional risk factors. I have reviewed this study and agree with the findings. [facility] FRAX* 10-year Probability of Fracture Based on femoral neck BMD: DualFemur (Right) Major Osteoporotic Fracture: 14.2% Hip Fracture:                4.7% Population:                  [place] (Caucasian) Risk Factors: Secondary Osteoporosis, Tobacco User (Current Smoker) *FRAX is a Armed forces logistics/support/administrative officer of the [employer] of [employer] for Metabolic Bone  Disease, a World Science writer (WHO) [employer]. ASSESSMENT: The probability of a major osteoporotic fracture is 14.2% within the next ten years. The probability of a hip fracture is 4.7% within the next ten years. Electronically Signed   By: Frederico Hamman M.D.   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 Reactive from iron deficiency. 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 CBC shows slightly elevated hemoglobin of 16.1 g/dL, slightly elevated hematocrit of 47.5%, and elevated platelet counts at 426 K.  CMP is wnl -Ferritin is 81. Iron saturation at 21%.  -no indication for IV Iron at this time. -We discussed that her continued smoking could certainly be a reason for inflammatory increase in her platelets and patient -recommended to pursue complete smoking cessation. -No evidence of progression of the patient's CHIP at this time. -continue to monitor with labs in 6 months with PCP.  - FOLLOW-UP: Return to clinic with Dr. Candise Che with labs in 12 months  The total time spent in the appointment was 20 minutes* .  All of the patient's questions were answered with apparent satisfaction. The patient knows to call the clinic with any problems, questions or concerns.   Wyvonnia Lora MD MS AAHIVMS [facility] Hematology/Oncology Physician [facility]  .*Total Encounter Time as defined by the Centers for Medicare and Medicaid Services  includes, in addition to the face-to-face time of a patient visit (documented in the note above) non-face-to-face time:  obtaining and reviewing outside history, ordering and reviewing medications, tests or procedures, care coordination (communications with other health care professionals or caregivers) and documentation in the medical record.    I,Param Shah,acting as a Neurosurgeon for Wyvonnia Lora, MD.,have documented all relevant documentation on the behalf of Wyvonnia Lora, MD,as directed by  Wyvonnia Lora, MD while in the presence of Wyvonnia Lora, MD.    .I have reviewed the above documentation for accuracy and completeness, and I agree with the above. Johney Maine MD

## 2023-03-25 ENCOUNTER — Other Ambulatory Visit: Payer: Self-pay

## 2023-03-25 ENCOUNTER — Encounter: Payer: Self-pay | Admitting: Hematology

## 2023-03-27 ENCOUNTER — Encounter: Payer: Self-pay | Admitting: Hematology

## 2023-03-31 ENCOUNTER — Other Ambulatory Visit: Payer: Self-pay

## 2023-03-31 ENCOUNTER — Other Ambulatory Visit (HOSPITAL_COMMUNITY): Payer: Self-pay

## 2023-04-07 ENCOUNTER — Telehealth: Payer: Self-pay | Admitting: Internal Medicine

## 2023-04-07 NOTE — Telephone Encounter (Signed)
Copied from CRM [id number]. Topic: General - Inquiry >> Apr 07, 2023 11:31 AM De Blanch wrote: Reason for CRM: [place] from [facility] stated is calling to f/u on fax sent to office on 09/19. Rx for a back support brace. Will resend today stated they need it back signed by provider to process.  Please advise.

## 2023-04-08 ENCOUNTER — Emergency Department (HOSPITAL_BASED_OUTPATIENT_CLINIC_OR_DEPARTMENT_OTHER): Payer: Medicare HMO | Admitting: Radiology

## 2023-04-08 ENCOUNTER — Other Ambulatory Visit: Payer: Self-pay

## 2023-04-08 ENCOUNTER — Encounter (HOSPITAL_BASED_OUTPATIENT_CLINIC_OR_DEPARTMENT_OTHER): Payer: Self-pay | Admitting: Emergency Medicine

## 2023-04-08 ENCOUNTER — Emergency Department (HOSPITAL_BASED_OUTPATIENT_CLINIC_OR_DEPARTMENT_OTHER)
Admission: EM | Admit: 2023-04-08 | Discharge: 2023-04-08 | Disposition: A | Discharge status: Home / Self Care | Payer: Medicare HMO | Attending: Emergency Medicine | Admitting: Emergency Medicine

## 2023-04-08 DIAGNOSIS — Z7982 Long term (current) use of aspirin: Secondary | ICD-10-CM | POA: Insufficient documentation

## 2023-04-08 DIAGNOSIS — S92302A Fracture of unspecified metatarsal bone(s), left foot, initial encounter for closed fracture: Secondary | ICD-10-CM

## 2023-04-08 DIAGNOSIS — Z91040 Latex allergy status: Secondary | ICD-10-CM | POA: Insufficient documentation

## 2023-04-08 DIAGNOSIS — M79672 Pain in left foot: Secondary | ICD-10-CM | POA: Diagnosis not present

## 2023-04-08 DIAGNOSIS — S90922A Unspecified superficial injury of left foot, initial encounter: Secondary | ICD-10-CM | POA: Diagnosis present

## 2023-04-08 DIAGNOSIS — Z79899 Other long term (current) drug therapy: Secondary | ICD-10-CM | POA: Insufficient documentation

## 2023-04-08 DIAGNOSIS — S92352A Displaced fracture of fifth metatarsal bone, left foot, initial encounter for closed fracture: Secondary | ICD-10-CM | POA: Insufficient documentation

## 2023-04-08 DIAGNOSIS — S92342A Displaced fracture of fourth metatarsal bone, left foot, initial encounter for closed fracture: Secondary | ICD-10-CM | POA: Insufficient documentation

## 2023-04-08 DIAGNOSIS — M79673 Pain in unspecified foot: Secondary | ICD-10-CM | POA: Diagnosis not present

## 2023-04-08 DIAGNOSIS — S99929A Unspecified injury of unspecified foot, initial encounter: Secondary | ICD-10-CM | POA: Diagnosis not present

## 2023-04-08 DIAGNOSIS — X501XXA Overexertion from prolonged static or awkward postures, initial encounter: Secondary | ICD-10-CM | POA: Diagnosis not present

## 2023-04-08 MED ORDER — OXYCODONE-ACETAMINOPHEN 5-325 MG PO TABS
1.0000 | ORAL_TABLET | Freq: Once | ORAL | Status: AC
  Administered 2023-04-08: 1 via ORAL
  Filled 2023-04-08: qty 1

## 2023-04-08 NOTE — Discharge Instructions (Signed)
Please read and follow all provided instructions.  Your diagnoses today include:  1. Closed displaced fracture of metatarsal bone of left foot, unspecified metatarsal, initial encounter    Tests performed today include: An x-ray of the affected area -shows broken fourth and fifth metatarsal bones, unclear based on the radiologist read whether these are new or old fractures, but are going to treat as new as you do not have a history of a broken foot Vital signs. See below for your results today.   Medications prescribed:  None  Take any prescribed medications only as directed.  Home care instructions:  Follow any educational materials contained in this packet Follow R.I.C.E. Protocol: R - rest your injury  I  - use ice on injury without applying directly to skin C - compress injury with bandage or splint E - elevate the injury as much as possible  Follow-up instructions: Please follow-up with your orthopedic physician in the next 3 to 5 days for follow-up.  Return instructions:  Please return if your toes or feet are numb or tingling, appear gray or blue, or you have severe pain (also elevate the leg and loosen splint or wrap if you were given one) Please return to the Emergency Department if you experience worsening symptoms.  Please return if you have any other emergent concerns.  Additional Information:  Your vital signs today were: BP (!) 156/74 (BP Location: Right Arm)   Pulse 64   Temp (!) 97.2 F (36.2 C)   Resp 16   SpO2 99%  If your blood pressure (BP) was elevated above 135/85 this visit, please have this repeated by your doctor within one month. --------------

## 2023-04-08 NOTE — ED Notes (Signed)
Declined ice

## 2023-04-08 NOTE — ED Provider Notes (Signed)
[place] EMERGENCY DEPARTMENT AT [facility] Provider Note   CSN: [id number] Arrival date & time: 04/08/23  1411     History  Chief Complaint  Patient presents with   Foot Injury    [name] [name] is a 67 y.o. female.  Patient presents to the emergency department today for evaluation of left foot pain.  Patient states that earlier today she was standing from a sitting position and rolled over on her foot.  She felt a pop and thinks that she broke her foot.  She has been able to bear weight.  Area is very tender.  Pain radiates up to her ankle.  No knee pain.  No head or neck injury.       Home Medications Prior to Admission medications   Medication Sig Start Date End Date Taking? Authorizing Provider  alendronate (FOSAMAX) 35 MG tablet Take 1 tablet (35 mg total) by mouth every 7 (seven) days. Take with a full glass of water on an empty stomach. Sit upright for 30-60 mins after taking 02/27/23   [name], MD  albuterol (VENTOLIN HFA) 108 (90 Base) MCG/ACT inhaler Inhale 2 puffs into the lungs every 6 (six) hours as needed for wheezing or shortness of breath. 01/17/23   [name], MD  amLODipine (NORVASC) 5 MG tablet Take 1 tablet (5 mg total) by mouth daily. 09/18/22   Joylene Grapes, NP  aspirin EC 81 MG tablet Take 81 mg by mouth daily.    [provider]  atorvastatin (LIPITOR) 80 MG tablet Take 1 tablet (80 mg total) by mouth daily, please schedule appointment for more refills 09/18/22   Joylene Grapes, NP  Blood Glucose Monitoring Suppl (TRUE METRIX METER) DEVI 1 kit by Does not apply route 4 (four) times daily. 04/04/17   Vivianne Master, PA-C  busPIRone (BUSPAR) 15 MG tablet Take 2 tablets (30 mg total) by mouth in the morning AND 1 tablet (15 mg total) daily at 12 noon AND 1 tablet (15 mg total) every evening. 03/14/23   Nwoko, Tommas Olp, PA  clonazePAM (KLONOPIN) 0.5 MG tablet Take 1 tablet (0.5 mg total) by mouth 2 (two) times daily as needed  for anxiety. 03/14/23   Nwoko, Tommas Olp, PA  doxepin (SINEQUAN) 75 MG capsule Take 1 capsule (75 mg total) by mouth at bedtime. 03/14/23   Nwoko, Tommas Olp, PA  escitalopram (LEXAPRO) 20 MG tablet Take 1 tablet (20 mg total) by mouth daily. 03/14/23   Nwoko, Tommas Olp, PA  fluticasone (FLONASE) 50 MCG/ACT nasal spray Place 2 sprays into both nostrils daily. 04/26/22   Domenick Gong, MD  Fluticasone-Umeclidin-Vilant (TRELEGY ELLIPTA) 100-62.5-25 MCG/ACT AEPB Inhale 1 puff into the lungs daily. 03/10/23   [name], MD  furosemide (LASIX) 20 MG tablet Take 1 tablet (20 mg total) by mouth daily. 09/18/22   Joylene Grapes, NP  gabapentin (NEURONTIN) 600 MG tablet TAKE 1 TABLET BY MOUTH 4 TIMES DAILY 12/20/22   [name], MD  glucose blood (TRUE METRIX BLOOD GLUCOSE TEST) test strip Use as instructed 01/21/18   [name], MD  ipratropium-albuterol (DUONEB) 0.5-2.5 (3) MG/3ML SOLN TAKE 3 MLS VIA NEBULIZATION EVERY 6 HOURS 11/18/19   Storm Frisk, MD  levothyroxine (SYNTHROID) 112 MCG tablet Take 1 tablet (112 mcg total) by mouth daily. 12/20/22   [name], MD  loperamide (IMODIUM A-D) 2 MG tablet Take 4 mg by mouth 4 (four) times daily as needed  for diarrhea or loose stools.    [provider]  losartan (COZAAR) 50 MG tablet Take 1.5 tablets (75 mg total) by mouth daily. 09/18/22   Joylene Grapes, NP  metFORMIN (GLUCOPHAGE) 500 MG tablet TAKE 1 TABLET (500 MG TOTAL) BY MOUTH 2 (TWO) TIMES DAILY WITH A MEAL. 10/14/22   [name], MD  mirtazapine (REMERON) 30 MG tablet Take 1 tablet (30 mg total) by mouth at bedtime. 03/14/23   Nwoko, Tommas Olp, PA  montelukast (SINGULAIR) 10 MG tablet Take 1 tablet (10 mg total) by mouth at bedtime. 12/20/22   [name], MD  nebivolol (BYSTOLIC) 10 MG tablet Take 1 tablet (10 mg total) by mouth daily. 09/18/22   Joylene Grapes, NP  nitroGLYCERIN (NITROSTAT) 0.4 MG SL tablet Place 1 tablet (0.4 mg total) under the tongue  every 5 (five) minutes as needed for chest pain. 09/18/22 12/17/22  Joylene Grapes, NP  omeprazole (PRILOSEC) 40 MG capsule Take 1 capsule (40 mg total) by mouth 2 (two) times daily before a meal. 02/19/23   Lemmon, Violet Baldy, PA  sodium chloride (OCEAN) 0.65 % SOLN nasal spray Place 1 spray into both nostrils as needed for congestion.    [provider]  Spacer/Aero-Holding Chambers (AEROCHAMBER MV) inhaler Use as instructed 04/26/22   Domenick Gong, MD  ticagrelor (BRILINTA) 60 MG TABS tablet Take 1 tablet (60 mg total) by mouth 2 (two) times daily. 09/18/22   Joylene Grapes, NP  triamcinolone (KENALOG) 0.025 % ointment Apply 1 Application topically 2 (two) times daily as needed. 01/17/23   [name], MD  TRUEPLUS LANCETS 28G MISC 28 g by Does not apply route QID. 04/04/17   Vivianne Master, PA-C      Allergies    Avocado, Latex, and Codeine    Review of Systems   Review of Systems  Physical Exam Updated Vital Signs BP (!) 160/85 (BP Location: Left Arm)   Pulse 70   Temp (!) 97.2 F (36.2 C)   Resp 16   SpO2 97%   Physical Exam Vitals and nursing note reviewed.  Constitutional:      Appearance: She is well-developed.  HENT:     Head: Normocephalic and atraumatic.  Eyes:     Pupils: Pupils are equal, round, and reactive to light.  Cardiovascular:     Pulses: Normal pulses. No decreased pulses.  Musculoskeletal:        General: Tenderness present.     Cervical back: Normal range of motion and neck supple.     Left ankle: Tenderness present over the lateral malleolus. No proximal fibula tenderness.     Left foot: Decreased range of motion. Tenderness present. No bony tenderness.     Comments: No tenderness over the proximal fibula on the left  Skin:    General: Skin is warm and dry.  Neurological:     Mental Status: She is alert.     Sensory: No sensory deficit.     Comments: Motor, sensation, and vascular distal to the injury is fully intact.    Psychiatric:        Mood and Affect: Mood normal.     ED Results / Procedures / Treatments   Labs (all labs ordered are listed, but only abnormal results are displayed) Labs Reviewed - No data to display  EKG None  Radiology DG Foot Complete Left  Result Date: 04/08/2023 CLINICAL DATA:  Left foot pain after injury last night. EXAM:  LEFT FOOT - COMPLETE 3+ VIEW COMPARISON:  May 27, 2004. FINDINGS: Old poorly healed fracture is seen involving the fifth metatarsal with large area of persistent nonunion. Also noted is old fourth metatarsal fracture. No acute fracture or dislocation is noted. Joint spaces are intact. IMPRESSION: Old fourth and fifth metatarsal fractures are noted. No definite acute abnormality seen. Electronically Signed   By: Lupita Raider M.D.   On: 04/08/2023 16:35    Procedures Procedures    Medications Ordered in ED Medications  oxyCODONE-acetaminophen (PERCOCET/ROXICET) 5-325 MG per tablet 1 tablet (1 tablet Oral Given 04/08/23 1605)    ED Course/ Medical Decision Making/ A&P    Patient seen and examined. History obtained directly from patient and family member at bedside.  Labs/EKG: None ordered  Imaging: Personally reviewed and interpreted x-ray of the left foot, appears midshaft fourth and fifth metatarsal fractures, mildly displaced.  Awaiting read.  Medications/Fluids: Ordered: P.o. Percocet.   Most recent vital signs reviewed and are as follows: BP (!) 156/74 (BP Location: Right Arm)   Pulse 64   Temp (!) 97.2 F (36.2 C)   Resp 16   SpO2 99%   Initial impression: Foot fracture  4:57 PM Reassessment performed. Patient appears stable, comfortable.  Imaging results reviewed: Radiologist states that the fractures have an appearance of an old healed fracture with nonunion, however patient is adamant that she has never broken her foot in the past.  Given clinical picture, will treat as acute fractures with splinting and crutches, orthopedic  follow-up.  Reviewed pertinent lab work and imaging with patient at bedside. Questions answered.   Most current vital signs reviewed and are as follows: BP (!) 156/74 (BP Location: Right Arm)   Pulse 64   Temp (!) 97.2 F (36.2 C)   Resp 16   SpO2 99%   Plan: Discharge to home.   Prescriptions written for: None.  Patient does not want stronger home medication due to fall risk.  Emily at bedside agrees.  Other home care instructions discussed: RICE protocol  ED return instructions discussed: New or worsening symptoms  Follow-up instructions discussed: Patient encouraged to follow-up with their orthopedist in 3 to 5 days.                                  Medical Decision Making Amount and/or Complexity of Data Reviewed Radiology: ordered.  Risk Prescription drug management.   Patient with foot injury and pain.  Clinical concern for acute metatarsal fractures given history and exam.  Radiology report states that these appear old, however patient does not confirm history of a broken foot in the past.  Family member agrees.  Given clinical picture, will treat as acute fracture until she can follow-up.  Foot is otherwise neurovascularly intact.  No ankle fracture suspected.  No proximal fibular fracture suspected.        Final Clinical Impression(s) / ED Diagnoses Final diagnoses:  Closed displaced fracture of metatarsal bone of left foot, unspecified metatarsal, initial encounter    Rx / DC Orders ED Discharge Orders     None         Renne Crigler, PA-C 04/08/23 1658    Charlynne Pander, MD 04/08/23 [phone]

## 2023-04-08 NOTE — ED Triage Notes (Signed)
"  I think I broke my foot" Twisted foot when getting up from chair, heard a pop Happened last night Left foot pain on top of foot

## 2023-04-08 NOTE — ED Notes (Signed)
Splint to the leg placed by Tech and visualized by the PA.Marland KitchenMarland Kitchen

## 2023-04-08 NOTE — ED Notes (Signed)
Ice placed on ankle.

## 2023-04-09 NOTE — Telephone Encounter (Signed)
Prescription from for back support brace was successfully faxed back on 04/09/2023. Per Dr.Johnson brace was not ordered by PCP and not medically necessary.

## 2023-04-09 NOTE — Telephone Encounter (Signed)
Requested forms have been successfully faxed back to [facility] on 04/09/2023.

## 2023-04-14 ENCOUNTER — Other Ambulatory Visit (HOSPITAL_COMMUNITY): Payer: Self-pay | Admitting: Physician Assistant

## 2023-04-14 DIAGNOSIS — F411 Generalized anxiety disorder: Secondary | ICD-10-CM

## 2023-04-15 ENCOUNTER — Encounter: Payer: Self-pay | Admitting: Orthopaedic Surgery

## 2023-04-15 ENCOUNTER — Ambulatory Visit (INDEPENDENT_AMBULATORY_CARE_PROVIDER_SITE_OTHER): Payer: Medicare HMO | Admitting: Orthopaedic Surgery

## 2023-04-15 VITALS — BP 107/60 | HR 47

## 2023-04-15 DIAGNOSIS — S93492A Sprain of other ligament of left ankle, initial encounter: Secondary | ICD-10-CM

## 2023-04-17 DIAGNOSIS — S93402A Sprain of unspecified ligament of left ankle, initial encounter: Secondary | ICD-10-CM | POA: Insufficient documentation

## 2023-04-17 NOTE — Progress Notes (Signed)
Office Visit Note   Patient: [name] [name]           Date of Birth: 02/08/1956           MRN: [id number] Visit Date: 04/15/2023              Requested by: [name], MD [address] [place],  [place] [address] PCP: [name], MD   Assessment & Plan: Visit Diagnoses:  1. Sprain of anterior talofibular ligament of left ankle, initial encounter     Plan: Patient had fourth and fifth metatarsal shaft fracture some point in the past but does not recall ever injuring it.  She has a new ankle injury which is a lateral ankle sprain.  Will place her in an Aircast she felt much better more secure in this.  She can follow-up if she has ongoing problems.  I discussed the therapy with Fosamax I always recommend they take it 18 months and then skip 6 months to allow time for bone remodeling and then she can get a repeat bone density test 2 years after the last 1 and then if it still not normal restart the Fosamax.  I discussed with her bone biopsy studies that showed improved bone microscopic lattice formation when the Fosamax is stopped for 6 months.  Follow-Up Instructions: No follow-ups on file.   Orders:  No orders of the defined types were placed in this encounter.  No orders of the defined types were placed in this encounter.     Procedures: No procedures performed   Clinical Data: No additional findings.   Subjective: Chief Complaint  Patient presents with   Left Foot - Fracture    HPI six 67-year-old female rolled her ankle 04/08/2023 seen in the ED x-rays are obtained and she states she has fracture of her metatarsals.  She was seen in the ED at [name].  No past injury to her foot that she can recall she never recalls having ecchymosis pain or difficulty walking.  Patient is in a splint.  She states her foot turned out when she was getting out of a chair.  She has been started on some Fosamax for osteoporosis.  I reviewed her bone density  from 02/24/2023 which falls in the osteopenia category.  She is on calcium and vitamin D.  I reviewed the x-rays and these are old injuries with healing of the fourth metatarsal shaft partial healing of the fifth with oblique shaft fractures.  Review of Systems updated unchanged.   Objective: Vital Signs: BP 107/60   Pulse (!) 47   Physical Exam Constitutional:      Appearance: She is well-developed.  HENT:     Head: Normocephalic.     Right Ear: External ear normal.     Left Ear: External ear normal. There is no impacted cerumen.  Eyes:     Pupils: Pupils are equal, round, and reactive to light.  Neck:     Thyroid: No thyromegaly.     Trachea: No tracheal deviation.  Cardiovascular:     Rate and Rhythm: Normal rate.  Pulmonary:     Effort: Pulmonary effort is normal.  Abdominal:     Palpations: Abdomen is soft.  Musculoskeletal:     Cervical back: No rigidity.  Skin:    General: Skin is warm and dry.  Neurological:     Mental Status: She is alert and oriented to person, place, and time.  Psychiatric:  Behavior: Behavior normal.     Ortho Exam no ecchymosis or tenderness over the fourth fifth metatarsals.  She is tender over the anterior talar fib and has some ecchymosis laterally consistent with a lateral ankle sprain.  Negative anterior drawer.  Specialty Comments:  No specialty comments available.  Imaging: LINICAL DATA:  Left foot pain after injury last night.   EXAM: LEFT FOOT - COMPLETE 3+ VIEW   COMPARISON:  May 27, 2004.   FINDINGS: Old poorly healed fracture is seen involving the fifth metatarsal with large area of persistent nonunion. Also noted is old fourth metatarsal fracture. No acute fracture or dislocation is noted. Joint spaces are intact.   IMPRESSION: Old fourth and fifth metatarsal fractures are noted. No definite acute abnormality seen.     Electronically Signed   By: Lupita Raider M.D.   On: 04/08/2023 16:35   PMFS  History: Patient Active Problem List   Diagnosis Date Noted   Left ankle sprain 04/17/2023   Substance abuse (HCC) 09/10/2022   Nontraumatic complete tear of right rotator cuff    Superior glenoid labrum lesion of left shoulder    Iron deficiency anemia 11/02/2021   Major depressive disorder, recurrent, in full remission with anxious distress (HCC) 05/31/2021   Rotator cuff tendinitis, right 05/08/2021   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   Chronic neck pain 02/26/2021   GAD (generalized anxiety disorder) 01/08/2021   Acute cystitis without hematuria 11/07/2020   Influenza vaccine needed 06/15/2020   Abdominal pain, epigastric 04/28/2019   Esophageal dysphagia 04/28/2019   Bipolar I disorder (HCC) 09/17/2018   Insomnia 10/28/2017   Prediabetes 05/28/2017   QT prolongation 03/25/2017   Encounter for screening mammogram for breast cancer 09/11/2015   Psoriasis 06/22/2015   COPD (chronic obstructive pulmonary disease) (HCC)GOLD E 05/18/2015   Anxiety and depression 07/10/2013   Essential hypertension, benign 06/07/2013   Dyslipidemia 06/07/2013   Asthma, chronic 06/07/2013   Emphysema lung (HCC) 06/07/2013   Chronic back pain 06/07/2013   CAD S/P percutaneous coronary angioplasty 06/07/2013   GERD (gastroesophageal reflux disease) 06/07/2013   Breast lump on left side at 3 o'clock position 10/13/2012   S/P abdominal hysterectomy and right salpingo-oophorectomy 08/29/2011   TOBACCO ABUSE 07/17/2009   Chronic rhinitis 07/17/2009   Lung nodule < 6cm on CT 07/17/2009   ALLERGY, FOOD 07/17/2009   Hypothyroidism 12/22/2008   Past Medical History:  Diagnosis Date   Allergy    seasonal   Anemia    Anxiety    takes Lexapro daily   Arthritis    "back, from neck down pass my bra area" (03/25/2017)   Asthma    Bartholin gland cyst 08/29/2011   Bruises easily    pt is on Effient   Chronic back pain    herniated nucleus pulposus   Chronic back pain    "neck to bra  area; lower back" (03/25/2017)   Chronic kidney disease    recurrent UTI's this year 2022   COPD (chronic obstructive pulmonary disease) (HCC)    early stages   Coronary artery disease    Depression    takes Klonopin daily   Diabetes mellitus without complication (HCC)    Diverticulosis    Fibroadenoma of left breast    GERD (gastroesophageal reflux disease)    takes Nexium daily   H/O hiatal hernia    Heart attack (HCC) 2011   Hemorrhoids    Hernia    Hyperlipidemia    takes Lipitor  daily   Hypertension    takes Losartan daily and Labetalol bid   Hypothyroidism    takes Synthroid daily   Insomnia    hydroxyzine prn   Joint pain    Pneumonia    "couple times" (03/25/2017)   Pre-diabetes    "just found out 1 wk ago" (03/25/2017)   Psoriasis    elbows,knees,back   Shortness of breath    with exertion   Slowing of urinary stream    Stress incontinence     Family History  Problem Relation Age of Onset   Heart disease Mother    Hypertension Mother    Stroke Mother    Mental illness Mother    Heart disease Father    Hypertension Father    Diabetes Father    Colon polyps Brother    Heart disease Maternal Grandfather    Cancer Paternal Grandmother        mouth   Anesthesia problems Daughter    Breast cancer Maternal Aunt    Throat cancer Maternal Uncle    Thyroid disease Paternal Aunt    Hypotension Neg Hx    Malignant hyperthermia Neg Hx    Pseudochol deficiency Neg Hx    Colon cancer Neg Hx    Stomach cancer Neg Hx    Esophageal cancer Neg Hx    Pancreatic cancer Neg Hx    Rectal cancer Neg Hx     Past Surgical History:  Procedure Laterality Date   ABDOMINAL EXPLORATION SURGERY  1977   ABDOMINAL HYSTERECTOMY  1977   "left one of my ovaries"   BACK SURGERY     BIOPSY  05/27/2019   Procedure: BIOPSY;  Surgeon: Corbin Ade, MD;  Location: [facility];  Service: Endoscopy;;   BREAST BIOPSY Left 11/2021   x2   COLONOSCOPY  2011   Dr. Christella Hartigan: Mild  diverticulosis, descending diminutive colon polyp (not retrieved), next colonoscopy 10 years   CORONARY ANGIOPLASTY WITH STENT PLACEMENT  2011 X2   "regular stents didn't work; had to go back in in ~ 1 month and put in medicated stents"   DILATION AND CURETTAGE OF UTERUS     ESOPHAGOGASTRODUODENOSCOPY     ESOPHAGOGASTRODUODENOSCOPY (EGD) WITH PROPOFOL N/A 05/27/2019   normal esophagus, dilation, erosive gastropathy s/p biopsy, normal duodenum. Negative H.pylori.    LEFT HEART CATH AND CORONARY ANGIOGRAPHY N/A 03/26/2017   Procedure: LEFT HEART CATH AND CORONARY ANGIOGRAPHY;  Surgeon: Lyn Records, MD;  Location: [facility] INVAS[facility] CV LAB;  Service: Cardiovascular;  Laterality: N/A;   LUMBAR LAMINECTOMY/DECOMPRESSION MICRODISCECTOMY  08/05/2011   Procedure: LUMBAR LAMINECTOMY/DECOMPRESSION MICRODISCECTOMY;  Surgeon: Clydene Fake, MD;  Location: [facility] NEURO ORS;  Service: Neurosurgery;  Laterality: Right;  Right Lumbar four-five extraforaminal discectomy   MALONEY DILATION N/A 05/27/2019   Procedure: Elease Hashimoto DILATION;  Surgeon: Corbin Ade, MD;  Location: [facility];  Service: Endoscopy;  Laterality: N/A;  54   SHOULDER ARTHROSCOPY WITH ROTATOR CUFF REPAIR AND OPEN BICEPS TENODESIS Right 12/24/2021   Procedure: RIGHT SHOULDER ARTHROSCOPY WITH ROTATOR CUFF REPAIR AND BICEPS TENODESIS;  Surgeon: Eldred Manges, MD;  Location: [facility] OR;  Service: Orthopedics;  Laterality: Right;   TONSILLECTOMY     as a child   UPPER GASTROINTESTINAL ENDOSCOPY     Social History   Occupational History   Not on file  Tobacco Use   Smoking status: Every Day    Current packs/day: 0.50    Average packs/day: 0.5 packs/day for 50.0 years (  25.0 ttl pk-yrs)    Types: Cigarettes   Smokeless tobacco: Never  Vaping Use   Vaping status: Some Days   Substances: Nicotine  Substance and Sexual Activity   Alcohol use: Yes    Comment: once a week   Drug use: Yes    Types: Marijuana    Comment: history of cocaine use,  been about a year, per patient (12/18/21)   Sexual activity: Not Currently    Birth control/protection: Surgical

## 2023-04-23 ENCOUNTER — Other Ambulatory Visit: Payer: Self-pay | Admitting: Pharmacist

## 2023-04-23 DIAGNOSIS — E1165 Type 2 diabetes mellitus with hyperglycemia: Secondary | ICD-10-CM

## 2023-04-23 MED ORDER — METFORMIN HCL 500 MG PO TABS
500.0000 mg | ORAL_TABLET | Freq: Two times a day (BID) | ORAL | 1 refills | Status: DC
Start: 2023-04-23 — End: 2023-11-10
  Filled 2023-04-23 – 2023-08-15 (×3): qty 180, 90d supply, fill #0

## 2023-04-23 NOTE — Progress Notes (Signed)
Pharmacy Quality Measure Review  This patient is appearing on a report for being at risk of failing the adherence measure for cholesterol (statin) and diabetes medications this calendar year.   Medication: atorvastatin Last fill date: 04/01/2023 for 90 day supply  Insurance report was not up to date. No action needed at this time.    Medication: metformin  Last fill date: 01/14/2023 for 90 day supply. Current rxn out of refills. Rxn sent for 90-day supply wwith 1 additional refill.  Butch Penny, PharmD, Patsy Baltimore, CPP Clinical Pharmacist [facility] [phone]

## 2023-04-24 ENCOUNTER — Other Ambulatory Visit: Payer: Self-pay

## 2023-04-25 ENCOUNTER — Telehealth (HOSPITAL_COMMUNITY): Payer: Medicare HMO | Admitting: Physician Assistant

## 2023-04-25 DIAGNOSIS — F411 Generalized anxiety disorder: Secondary | ICD-10-CM

## 2023-04-25 DIAGNOSIS — F3342 Major depressive disorder, recurrent, in full remission: Secondary | ICD-10-CM

## 2023-04-25 MED ORDER — ESCITALOPRAM OXALATE 20 MG PO TABS
20.0000 mg | ORAL_TABLET | Freq: Every day | ORAL | 3 refills | Status: DC
  Filled 2023-04-25: qty 30, 30d supply, fill #0
  Filled 2023-06-06: qty 30, 30d supply, fill #1

## 2023-04-25 MED ORDER — BUSPIRONE HCL 15 MG PO TABS
ORAL_TABLET | ORAL | 3 refills | Status: DC
  Filled 2023-04-25: qty 120, 30d supply, fill #0
  Filled 2023-06-06: qty 120, 30d supply, fill #1

## 2023-04-25 MED ORDER — MIRTAZAPINE 45 MG PO TABS
45.0000 mg | ORAL_TABLET | Freq: Every day | ORAL | 3 refills | Status: DC
  Filled 2023-04-25: qty 30, 30d supply, fill #0
  Filled 2023-06-06: qty 30, 30d supply, fill #1

## 2023-04-25 MED ORDER — CLONAZEPAM 0.5 MG PO TABS
0.5000 mg | ORAL_TABLET | Freq: Two times a day (BID) | ORAL | 1 refills | Status: DC | PRN
  Filled 2023-04-25: qty 60, 30d supply, fill #0

## 2023-04-25 MED ORDER — DOXEPIN HCL 75 MG PO CAPS
75.0000 mg | ORAL_CAPSULE | Freq: Every day | ORAL | 3 refills | Status: DC
  Filled 2023-04-25: qty 30, 30d supply, fill #0
  Filled 2023-06-06: qty 30, 30d supply, fill #1

## 2023-04-26 ENCOUNTER — Other Ambulatory Visit: Payer: Self-pay

## 2023-04-28 ENCOUNTER — Encounter (HOSPITAL_COMMUNITY): Payer: Self-pay | Admitting: Physician Assistant

## 2023-04-28 ENCOUNTER — Other Ambulatory Visit: Payer: Self-pay

## 2023-04-28 NOTE — Progress Notes (Signed)
BH MD/PA/NP OP Progress Note  Virtual Visit via Video Note  I connected with [name] [name] on 04/28/23 at  4:30 PM EDT by a video enabled telemedicine application and verified that I am speaking with the correct person using two identifiers.  Location: Patient: Home Provider: Clinic   I discussed the limitations of evaluation and management by telemedicine and the availability of in person appointments. The patient expressed understanding and agreed to proceed.  Follow Up Instructions:   I discussed the assessment and treatment plan with the patient. The patient was provided an opportunity to ask questions and all were answered. The patient agreed with the plan and demonstrated an understanding of the instructions.   The patient was advised to call back or seek an in-person evaluation if the symptoms worsen or if the condition fails to improve as anticipated.  I provided 15 minutes of non-face-to-face time during this encounter.  Meta Hatchet, PA   04/28/2023 4:41 AM [name] [name]  MRN:  [id number]  Chief Complaint:  Chief Complaint  Patient presents with   Follow-up   Medication Management   HPI:   [name] [name] is a 67 year old female with a past psychiatric history significant for insomnia, generalized anxiety disorder, and major depressive disorder who presents to [facility] via virtual video visit for follow-up and medication management.  Patient is currently being managed on the following medications:  Doxepin (Sinequan) 50 mg at bedtime Buspirone 15 mg 3 times daily in the morning/1 tablet at noon/1 tablet in the evening  Escitalopram 30 mg daily Clonazepam 0.5 mg 2 times daily as needed Mirtazapine 30 mg at bedtime  Patient presents to the encounter stating that she has been having issues with sleep.  Patient reports that she has been waking up at 3:30 AM to 4:30 AM and is unable to go back to sleep.  She reports that the  sleep that she has been going on for 2 to 3 weeks.  Despite her sleep issues, patient denies depression.  She endorses anxiety attributed to recently messing up her foot/ankle and is currently in a cast.  Patient denies any new stressors at this time.  A PHQ-9 screen was performed with the patient scoring a 5.  Patient is alert and oriented x4, pleasant, calm, cooperative, and fully engaged in conversation during the encounter.  Patient described her mood as tired.  Patient denies suicidal or homicidal ideations.  She further denies auditory or visual hallucinations and does not appear to be responding to internal/external stimuli.  Patient endorses fair sleep and receives on average 6 hours of intermittent sleep per night.  Patient endorses fair appetite and eats on average 2 meals per day.  Patient endorses alcohol consumption on occasion.  Patient endorses tobacco use and smokes on average a pack per day.  Patient denies illicit drug use.  Visit Diagnosis:    ICD-10-CM   1. Major depressive disorder, recurrent, in full remission with anxious distress (HCC)  F33.42 doxepin (SINEQUAN) 75 MG capsule    busPIRone (BUSPAR) 15 MG tablet    mirtazapine (REMERON) 45 MG tablet    escitalopram (LEXAPRO) 20 MG tablet    2. GAD (generalized anxiety disorder)  F41.1 busPIRone (BUSPAR) 15 MG tablet    mirtazapine (REMERON) 45 MG tablet    escitalopram (LEXAPRO) 20 MG tablet    clonazePAM (KLONOPIN) 0.5 MG tablet      Past Psychiatric History:  Insomnia Major depressive disorder Generalized anxiety  disorder  Past Medical History:  Past Medical History:  Diagnosis Date   Allergy    seasonal   Anemia    Anxiety    takes Lexapro daily   Arthritis    "back, from neck down pass my bra area" (03/25/2017)   Asthma    Bartholin gland cyst 08/29/2011   Bruises easily    pt is on Effient   Chronic back pain    herniated nucleus pulposus   Chronic back pain    "neck to bra area; lower back"  (03/25/2017)   Chronic kidney disease    recurrent UTI's this year 2022   COPD (chronic obstructive pulmonary disease) (HCC)    early stages   Coronary artery disease    Depression    takes Klonopin daily   Diabetes mellitus without complication (HCC)    Diverticulosis    Fibroadenoma of left breast    GERD (gastroesophageal reflux disease)    takes Nexium daily   H/O hiatal hernia    Heart attack (HCC) 2011   Hemorrhoids    Hernia    Hyperlipidemia    takes Lipitor daily   Hypertension    takes Losartan daily and Labetalol bid   Hypothyroidism    takes Synthroid daily   Insomnia    hydroxyzine prn   Joint pain    Pneumonia    "couple times" (03/25/2017)   Pre-diabetes    "just found out 1 wk ago" (03/25/2017)   Psoriasis    elbows,knees,back   Shortness of breath    with exertion   Slowing of urinary stream    Stress incontinence     Past Surgical History:  Procedure Laterality Date   ABDOMINAL EXPLORATION SURGERY  1977   ABDOMINAL HYSTERECTOMY  1977   "left one of my ovaries"   BACK SURGERY     BIOPSY  05/27/2019   Procedure: BIOPSY;  Surgeon: Corbin Ade, MD;  [facility]: [facility];  Service: Endoscopy;;   BREAST BIOPSY Left 11/2021   x2   COLONOSCOPY  2011   Dr. Christella Hartigan: Mild diverticulosis, descending diminutive colon polyp (not retrieved), next colonoscopy 10 years   CORONARY ANGIOPLASTY WITH STENT PLACEMENT  2011 X2   "regular stents didn't work; had to go back in in ~ 1 month and put in medicated stents"   DILATION AND CURETTAGE OF UTERUS     ESOPHAGOGASTRODUODENOSCOPY     ESOPHAGOGASTRODUODENOSCOPY (EGD) WITH PROPOFOL N/A 05/27/2019   normal esophagus, dilation, erosive gastropathy s/p biopsy, normal duodenum. Negative H.pylori.    LEFT HEART CATH AND CORONARY ANGIOGRAPHY N/A 03/26/2017   Procedure: LEFT HEART CATH AND CORONARY ANGIOGRAPHY;  Surgeon: Lyn Records, MD;  Location: [facility] INVASIVE CV LAB;  Service: Cardiovascular;  Laterality: N/A;    LUMBAR LAMINECTOMY/DECOMPRESSION MICRODISCECTOMY  08/05/2011   Procedure: LUMBAR LAMINECTOMY/DECOMPRESSION MICRODISCECTOMY;  Surgeon: Clydene Fake, MD;  Location: [facility] NEURO ORS;  Service: Neurosurgery;  Laterality: Right;  Right Lumbar four-five extraforaminal discectomy   MALONEY DILATION N/A 05/27/2019   Procedure: Elease Hashimoto DILATION;  Surgeon: Corbin Ade, MD;  Location: [facility];  Service: Endoscopy;  Laterality: N/A;  54   SHOULDER ARTHROSCOPY WITH ROTATOR CUFF REPAIR AND OPEN BICEPS TENODESIS Right 12/24/2021   Procedure: RIGHT SHOULDER ARTHROSCOPY WITH ROTATOR CUFF REPAIR AND BICEPS TENODESIS;  Surgeon: Eldred Manges, MD;  Location: [facility] OR;  Service: Orthopedics;  Laterality: Right;   TONSILLECTOMY     as a child   UPPER GASTROINTESTINAL ENDOSCOPY  Family Psychiatric History:  See intake H&P for full details. Reviewed, with no updates at this time.  Family History:  Family History  Problem Relation Age of Onset   Heart disease Mother    Hypertension Mother    Stroke Mother    Mental illness Mother    Heart disease Father    Hypertension Father    Diabetes Father    Colon polyps Brother    Heart disease Maternal Grandfather    Cancer Paternal Grandmother        mouth   Anesthesia problems Daughter    Breast cancer Maternal Aunt    Throat cancer Maternal Uncle    Thyroid disease Paternal Aunt    Hypotension Neg Hx    Malignant hyperthermia Neg Hx    Pseudochol deficiency Neg Hx    Colon cancer Neg Hx    Stomach cancer Neg Hx    Esophageal cancer Neg Hx    Pancreatic cancer Neg Hx    Rectal cancer Neg Hx     Social History:  Social History   Socioeconomic History   Marital status: Divorced    Spouse name: Not on file   Number of children: 1   Years of education: Not on file   Highest education level: 9th grade  Occupational History   Not on file  Tobacco Use   Smoking status: Every Day    Current packs/day: 0.50    Average packs/day: 0.5  packs/day for 50.0 years (25.0 ttl pk-yrs)    Types: Cigarettes   Smokeless tobacco: Never  Vaping Use   Vaping status: Some Days   Substances: Nicotine  Substance and Sexual Activity   Alcohol use: Yes    Comment: once a week   Drug use: Yes    Types: Marijuana    Comment: history of cocaine use, been about a year, per patient (12/18/21)   Sexual activity: Not Currently    Birth control/protection: Surgical  Other Topics Concern   Not on file  Social History Narrative   Not on file   Social Determinants of Health   Financial Resource Strain: Low Risk  (12/24/2022)   Overall Financial Resource Strain (CARDIA)    Difficulty of Paying Living Expenses: Not hard at all  Food Insecurity: No Food Insecurity (12/24/2022)   Hunger Vital Sign    Worried About Running Out of Food in the Last Year: Never true    Ran Out of Food in the Last Year: Never true  Transportation Needs: No Transportation Needs (12/24/2022)   PRAPARE - Administrator, Civil Service (Medical): No    Lack of Transportation (Non-Medical): No  Physical Activity: Inactive (12/24/2022)   Exercise Vital Sign    Days of Exercise per Week: 0 days    Minutes of Exercise per Session: 0 min  Stress: No Stress Concern Present (12/24/2022)   [employer] of Occupational Health - Occupational Stress Questionnaire    Feeling of Stress : Only a little  Social Connections: Moderately Isolated (12/24/2022)   Social Connection and Isolation Panel [NHANES]    Frequency of Communication with Friends and Family: More than three times a week    Frequency of Social Gatherings with Friends and Family: Three times a week    Attends Religious Services: 1 to 4 times per year    Active Member of Clubs or Organizations: No    Attends Banker Meetings: Never    Marital Status: Divorced    Allergies:  Allergies  Allergen Reactions   Avocado Anaphylaxis   Latex Shortness Of Breath and Rash   Codeine Nausea Only     Metabolic Disorder Labs: Lab Results  Component Value Date   HGBA1C 5.7 01/17/2023   MPG 105.41 12/18/2021   MPG 119.76 10/05/2021   No results found for: "PROLACTIN" Lab Results  Component Value Date   CHOL 229 (H) 03/21/2022   TRIG 99 03/21/2022   HDL 76 03/21/2022   CHOLHDL 3.0 03/21/2022   VLDL 15 04/16/2016   LDLCALC 136 (H) 03/21/2022   LDLCALC 69 11/24/2020   Lab Results  Component Value Date   TSH 3.180 09/10/2022   TSH 0.502 10/25/2021    Therapeutic Level Labs: No results found for: "LITHIUM" No results found for: "VALPROATE" No results found for: "CBMZ"  Current Medications: Current Outpatient Medications  Medication Sig Dispense Refill   alendronate (FOSAMAX) 35 MG tablet Take 1 tablet (35 mg total) by mouth every 7 (seven) days. Take with a full glass of water on an empty stomach. Sit upright for 30-60 mins after taking 12 tablet 1   albuterol (VENTOLIN HFA) 108 (90 Base) MCG/ACT inhaler Inhale 2 puffs into the lungs every 6 (six) hours as needed for wheezing or shortness of breath. 18 g 12   amLODipine (NORVASC) 5 MG tablet Take 1 tablet (5 mg total) by mouth daily. 90 tablet 3   aspirin EC 81 MG tablet Take 81 mg by mouth daily.     atorvastatin (LIPITOR) 80 MG tablet Take 1 tablet (80 mg total) by mouth daily, please schedule appointment for more refills 90 tablet 3   Blood Glucose Monitoring Suppl (TRUE METRIX METER) DEVI 1 kit by Does not apply route 4 (four) times daily. 1 Device 0   busPIRone (BUSPAR) 15 MG tablet Take 2 tablets (30 mg total) by mouth in the morning AND 1 tablet (15 mg total) daily at 12 noon AND 1 tablet (15 mg total) every evening. 120 tablet 3   [START ON 05/13/2023] clonazePAM (KLONOPIN) 0.5 MG tablet Take 1 tablet (0.5 mg total) by mouth 2 (two) times daily as needed for anxiety. 60 tablet 1   doxepin (SINEQUAN) 75 MG capsule Take 1 capsule (75 mg total) by mouth at bedtime. 30 capsule 3   escitalopram (LEXAPRO) 20 MG tablet  Take 1 tablet (20 mg total) by mouth daily. 30 tablet 3   fluticasone (FLONASE) 50 MCG/ACT nasal spray Place 2 sprays into both nostrils daily. 16 g 0   Fluticasone-Umeclidin-Vilant (TRELEGY ELLIPTA) 100-62.5-25 MCG/ACT AEPB Inhale 1 puff into the lungs daily. 60 each 2   furosemide (LASIX) 20 MG tablet Take 1 tablet (20 mg total) by mouth daily. 90 tablet 3   gabapentin (NEURONTIN) 600 MG tablet TAKE 1 TABLET BY MOUTH 4 TIMES DAILY 360 tablet 2   glucose blood (TRUE METRIX BLOOD GLUCOSE TEST) test strip Use as instructed 100 each 12   ipratropium-albuterol (DUONEB) 0.5-2.5 (3) MG/3ML SOLN TAKE 3 MLS VIA NEBULIZATION EVERY 6 HOURS 360 mL 1   levothyroxine (SYNTHROID) 112 MCG tablet Take 1 tablet (112 mcg total) by mouth daily. 90 tablet 2   loperamide (IMODIUM A-D) 2 MG tablet Take 4 mg by mouth 4 (four) times daily as needed for diarrhea or loose stools.     losartan (COZAAR) 50 MG tablet Take 1.5 tablets (75 mg total) by mouth daily. 135 tablet 3   metFORMIN (GLUCOPHAGE) 500 MG tablet Take 1 tablet (500 mg total) by mouth 2 (  two) times daily with a meal. 180 tablet 1   mirtazapine (REMERON) 45 MG tablet Take 1 tablet (45 mg total) by mouth at bedtime. 30 tablet 3   montelukast (SINGULAIR) 10 MG tablet Take 1 tablet (10 mg total) by mouth at bedtime. 90 tablet 2   nebivolol (BYSTOLIC) 10 MG tablet Take 1 tablet (10 mg total) by mouth daily. 90 tablet 3   nitroGLYCERIN (NITROSTAT) 0.4 MG SL tablet Place 1 tablet (0.4 mg total) under the tongue every 5 (five) minutes as needed for chest pain. 25 tablet 3   omeprazole (PRILOSEC) 40 MG capsule Take 1 capsule (40 mg total) by mouth 2 (two) times daily before a meal. 60 capsule 5   sodium chloride (OCEAN) 0.65 % SOLN nasal spray Place 1 spray into both nostrils as needed for congestion.     Spacer/Aero-Holding Chambers (AEROCHAMBER MV) inhaler Use as instructed 1 each 1   ticagrelor (BRILINTA) 60 MG TABS tablet Take 1 tablet (60 mg total) by mouth 2  (two) times daily. 60 tablet 52   triamcinolone (KENALOG) 0.025 % ointment Apply 1 Application topically 2 (two) times daily as needed. 80 g 0   TRUEPLUS LANCETS 28G MISC 28 g by Does not apply route QID. 120 each 2   No current facility-administered medications for this visit.     Musculoskeletal: Strength & Muscle Tone: Unable to assess due to telemedicine visit Gait & Station: Unable to assess due to telemedicine visit Patient leans: Unable to assess due to telemedicine visit  Psychiatric Specialty Exam: Review of Systems  Psychiatric/Behavioral:  Positive for sleep disturbance. Negative for dysphoric mood, hallucinations, self-injury and suicidal ideas. The patient is nervous/anxious. The patient is not hyperactive.     There were no vitals taken for this visit.There is no height or weight on file to calculate BMI.  General Appearance: Casual  Eye Contact:  Good  Speech:  Clear and Coherent and Normal Rate  Volume:  Normal  Mood:  Anxious  Affect:  Appropriate  Thought Process:  Coherent, Goal Directed, and Descriptions of Associations: Intact  Orientation:  Full (Time, Place, and Person)  Thought Content: WDL   Suicidal Thoughts:  No  Homicidal Thoughts:  No  Memory:  Immediate;   Good Recent;   Good Remote;   Good  Judgement:  Fair  Insight:  Fair  Psychomotor Activity:  Normal  Concentration:  Concentration: Good and Attention Span: Good  Recall:  [employer] of Knowledge: Fair  Language: Fair  Akathisia:  Negative  Handed:  Right  AIMS (if indicated): not done  Assets:  Communication Skills Desire for Improvement Housing  ADL's:  Intact  Cognition: WNL  Sleep:  Fair   Screenings: AUDIT    Flowsheet Row [facility]  Alcohol Use Disorder Identification Test Final Score (AUDIT) 12      GAD-7    Flowsheet Row Video Visit from [facility]/25/2023 [facility]  Video Visit from 03/14/2023 in [facility] Office Visit from [facility]/17/2023 in [facility] Video Visit from 12/13/2022 in [facility] Video Visit from 09/20/2022 in [facility]  Total GAD-7 Score 0 9 0 3 8      PHQ2-9    Flowsheet Row Video Visit from 04/25/2023 in [facility] Video Visit from 03/14/2023 in [facility] Office Visit  from [facility]/17/2023 in [facility] Clinical Support from 12/24/2022 in [facility] Video Visit from 12/13/2022 in [facility]  PHQ-2 Total Score 2 2 0 0 0  PHQ-9 Total Score 5 8 1  -- --      Flowsheet Row Video Visit from 04/25/2023 in [facility] ED from 04/08/2023 [facility] Video Visit from 03/14/2023 in [facility]  C-SSRS RISK CATEGORY No Risk No Risk No Risk        Assessment and Plan:   [name] [name] is a 67 year old female with a past psychiatric history significant for insomnia, generalized anxiety disorder, and major depressive disorder who presents to [facility] via virtual video visit for follow-up and medication management.  Patient presents to the encounter stating that she has been having issues with her sleep most recently.  She reports that she often wakes up at 3:30 AM or 4:30 AM in the morning and is unable to fall back asleep.  She denies overt depressive symptoms at this time but continues to endorse anxiety attributed to recently messing up her ankle/foot.  Provider recommended increasing her mirtazapine dosage from 30 mg to 45 mg at bedtime for the management of her sleep and for mood stability.  Patient was agreeable to recommendation.  Patient's  medications to be e-prescribed to pharmacy of choice.  Collaboration of Care: Collaboration of Care: Medication Management AEB patient's medications being managed by this provider and Psychiatrist AEB patient being managed by this behavioral health provider  Patient/Guardian was advised Release of Information must be obtained prior to any record release in order to collaborate their care with an outside provider. Patient/Guardian was advised if they have not already done so to contact the registration department to sign all necessary forms in order for [place] to release information regarding their care.   Consent: Patient/Guardian gives verbal consent for treatment and assignment of benefits for services provided during this visit. Patient/Guardian expressed understanding and agreed to proceed.   1. Major depressive disorder, recurrent, in full remission with anxious distress (HCC)  - doxepin (SINEQUAN) 75 MG capsule; Take 1 capsule (75 mg total) by mouth at bedtime.  Dispense: 30 capsule; Refill: 3 - busPIRone (BUSPAR) 15 MG tablet; Take 2 tablets (30 mg total) by mouth in the morning AND 1 tablet (15 mg total) daily at 12 noon AND 1 tablet (15 mg total) every evening.  Dispense: 120 tablet; Refill: 3 - mirtazapine (REMERON) 45 MG tablet; Take 1 tablet (45 mg total) by mouth at bedtime.  Dispense: 30 tablet; Refill: 3 - escitalopram (LEXAPRO) 20 MG tablet; Take 1 tablet (20 mg total) by mouth daily.  Dispense: 30 tablet; Refill: 3  2. GAD (generalized anxiety disorder)  - busPIRone (BUSPAR) 15 MG tablet; Take 2 tablets (30 mg total) by mouth in the morning AND 1 tablet (15 mg total) daily at 12 noon AND 1 tablet (15 mg total) every evening.  Dispense: 120 tablet; Refill: 3 - mirtazapine (REMERON) 45 MG tablet; Take 1 tablet (45 mg total) by mouth at bedtime.  Dispense: 30 tablet; Refill: 3 - escitalopram (LEXAPRO) 20 MG tablet; Take 1 tablet (20 mg total) by mouth daily.  Dispense: 30 tablet; Refill:  3 - clonazePAM (KLONOPIN) 0.5 MG tablet; Take 1 tablet (0.5 mg total) by mouth 2 (two) times daily as needed for anxiety.  Dispense: 60 tablet; Refill: 1  Patient to follow up in 2 months Provider spent a total of 15 minutes with the patient/reviewing patient's chart  Meta Hatchet, PA 04/28/2023, 4:41 AM

## 2023-04-30 ENCOUNTER — Other Ambulatory Visit: Payer: Self-pay

## 2023-05-03 ENCOUNTER — Encounter: Payer: Self-pay | Admitting: Psychiatry

## 2023-05-03 DIAGNOSIS — F17200 Nicotine dependence, unspecified, uncomplicated: Secondary | ICD-10-CM | POA: Insufficient documentation

## 2023-05-07 ENCOUNTER — Other Ambulatory Visit: Payer: Self-pay

## 2023-05-08 ENCOUNTER — Other Ambulatory Visit: Payer: Self-pay | Admitting: Internal Medicine

## 2023-05-08 DIAGNOSIS — J4489 Other specified chronic obstructive pulmonary disease: Secondary | ICD-10-CM

## 2023-05-09 NOTE — Telephone Encounter (Signed)
Requested medications are due for refill today.  unsure  Requested medications are on the active medications list.  yes  Last refill. 03/10/2023 60 2 rf  Future visit scheduled.   yes  Notes to clinic.  Medication not assigned to a protocol. Please review for refill.    Requested Prescriptions  Pending Prescriptions Disp Refills   TRELEGY ELLIPTA 100-62.5-25 MCG/ACT AEPB [Pharmacy Med Name: Trelegy Ellipta 100 mcg-62.5 mcg-25 mcg powder for inhalation] 60 each 10    Sig: INHALE 1 PUFF into THE lungs DAILY     Off-Protocol Failed - 05/08/2023  9:10 AM      Failed - Medication not assigned to a protocol, review manually.      Passed - Valid encounter within last 12 months    Recent Outpatient Visits           3 months ago COPD with chronic bronchitis   [place] [facility] Jonah Blue B, MD   8 months ago Pulmonary emphysema, unspecified emphysema type [facility])   [place] [facility] [name], MD   9 months ago Viral respiratory illness   [place] [facility] [name], MD   1 year ago Pulmonary emphysema, unspecified emphysema type [facility])   [facility] Storm Frisk, MD   1 year ago Preoperative evaluation to rule out surgical contraindication   [place] [facility] [name], MD       Future Appointments             In 1 week [name], MD [facility]   In 2 weeks Lennette Bihari, MD [facility]            Refused Prescriptions Disp Refills   VENTOLIN HFA 108 (90 Base) MCG/ACT inhaler [Pharmacy Med Name: Ventolin HFA 90 mcg/actuation aerosol inhaler] 18 g 10    Sig: INHALE 2 PUFFS into THE lungs EVERY 6 HOURS AS NEEDED FOR wheezing OR SHORTNESS OF BREATH     Pulmonology:  Beta Agonists 2 Passed - 05/08/2023  9:10 AM       Passed - Last BP in normal range    BP Readings from Last 1 Encounters:  04/15/23 107/60         Passed - Last Heart Rate in normal range    Pulse Readings from Last 1 Encounters:  04/15/23 (!) 47         Passed - Valid encounter within last 12 months    Recent Outpatient Visits           3 months ago COPD with chronic bronchitis   [facility] Jonah Blue B, MD   8 months ago Pulmonary emphysema, unspecified emphysema type [facility])   [place] [facility] [name], MD   9 months ago Viral respiratory illness   [place] [facility] [name], MD   1 year ago Pulmonary emphysema, unspecified emphysema type [facility])    [facility] Storm Frisk, MD   1 year ago Preoperative evaluation to rule out surgical contraindication   [place] [facility] [name], MD       Future Appointments  In 1 week [name], MD [facility]   In 2 weeks Lennette Bihari, MD [facility]

## 2023-05-09 NOTE — Telephone Encounter (Signed)
Requested Prescriptions  Pending Prescriptions Disp Refills   TRELEGY ELLIPTA 100-62.5-25 MCG/ACT AEPB [Pharmacy Med Name: Trelegy Ellipta 100 mcg-62.5 mcg-25 mcg powder for inhalation] 60 each 10    Sig: INHALE 1 PUFF into THE lungs DAILY     Off-Protocol Failed - 05/08/2023  9:10 AM      Failed - Medication not assigned to a protocol, review manually.      Passed - Valid encounter within last 12 months    Recent Outpatient Visits           3 months ago COPD with chronic bronchitis   [place] [facility] Jonah Blue B, MD   8 months ago Pulmonary emphysema, unspecified emphysema type [facility])   [place] [facility] [name], MD   9 months ago Viral respiratory illness   [place] [facility] [name], MD   1 year ago Pulmonary emphysema, unspecified emphysema type [facility])   [place] [facility] Storm Frisk, MD   1 year ago Preoperative evaluation to rule out surgical contraindication   [place] [facility] [name], MD       Future Appointments             In 1 week [name], MD [facility]   In 2 weeks Lennette Bihari, MD [facility]            Refused Prescriptions Disp Refills   VENTOLIN HFA 108 (90 Base) MCG/ACT inhaler [Pharmacy Med Name: Ventolin HFA 90 mcg/actuation aerosol inhaler] 18 g 10    Sig: INHALE 2 PUFFS into THE lungs EVERY 6 HOURS AS NEEDED FOR wheezing OR SHORTNESS OF BREATH     Pulmonology:  Beta Agonists 2 Passed - 05/08/2023  9:10 AM      Passed - Last BP in normal range    BP Readings from Last 1 Encounters:  04/15/23 107/60         Passed - Last Heart Rate in normal range    Pulse Readings from Last 1 Encounters:  04/15/23 (!) 47         Passed - Valid encounter within last 12 months    Recent  Outpatient Visits           3 months ago COPD with chronic bronchitis   [place] [facility] Jonah Blue B, MD   8 months ago Pulmonary emphysema, unspecified emphysema type [facility])   [place] [facility] [name], MD   9 months ago Viral respiratory illness   [place] [facility] [name], MD   1 year ago Pulmonary emphysema, unspecified emphysema type [facility])   [place] [facility] Storm Frisk, MD   1 year ago Preoperative evaluation to rule out surgical contraindication    [facility] [name], MD       Future Appointments             In 1 week [name], MD [facility]   In 2 weeks Lennette Bihari, MD [facility]

## 2023-05-12 ENCOUNTER — Other Ambulatory Visit: Payer: Self-pay

## 2023-05-16 ENCOUNTER — Ambulatory Visit: Payer: Self-pay | Admitting: *Deleted
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
## 2023-05-16 NOTE — Telephone Encounter (Signed)
Noted  

## 2023-05-16 NOTE — Telephone Encounter (Signed)
  Chief Complaint: Panic attack.   Called in to be sure she was ok and did the right thing by taking a Klonopin. Symptoms: short of breath, chest tight during a panic attack after watching a scary movie.  Took a Klonopin and is not having chest tightness and says she is breathing much better now.   She mentioned having some congestion and COPD. Frequency: This morning Pertinent Negatives: Patient denies having chest tightness and difficulty breathing after taking the Klonopin for her panic attack. Disposition: [] ED /[] Urgent Care (no appt availability in office) / [] Appointment(In office/virtual)/ []  [facility]/ [] Home Care/ [] Refused Recommended Disposition /[] [facility]/ [x]  Follow-up with PCP Additional Notes: No appt made since taking the Klonopin relieved her symptoms.   I gave her ED precautions, if chest tightness returns and difficulty breathing to go to the ED especially if she is not having a panic attack and/or the Klonopin does not help.    She verbalized understanding.    "I just called to be sure I did the right thing by taking  the Klonopin for the panic attack".    I let her know she did but also gave the ED precautions.

## 2023-05-20 ENCOUNTER — Encounter: Payer: Self-pay | Admitting: Internal Medicine

## 2023-05-20 ENCOUNTER — Ambulatory Visit: Payer: Medicare HMO | Attending: Internal Medicine | Admitting: Internal Medicine

## 2023-05-20 ENCOUNTER — Other Ambulatory Visit: Payer: Self-pay

## 2023-05-20 ENCOUNTER — Telehealth: Payer: Self-pay

## 2023-05-20 VITALS — BP 120/76 | HR 62 | Temp 98.2°F | Ht 61.0 in | Wt 148.0 lb

## 2023-05-20 DIAGNOSIS — J441 Chronic obstructive pulmonary disease with (acute) exacerbation: Secondary | ICD-10-CM

## 2023-05-20 DIAGNOSIS — F17200 Nicotine dependence, unspecified, uncomplicated: Secondary | ICD-10-CM

## 2023-05-20 DIAGNOSIS — I10 Essential (primary) hypertension: Secondary | ICD-10-CM | POA: Diagnosis not present

## 2023-05-20 DIAGNOSIS — I2510 Atherosclerotic heart disease of native coronary artery without angina pectoris: Secondary | ICD-10-CM

## 2023-05-20 DIAGNOSIS — D751 Secondary polycythemia: Secondary | ICD-10-CM

## 2023-05-20 DIAGNOSIS — E039 Hypothyroidism, unspecified: Secondary | ICD-10-CM

## 2023-05-20 DIAGNOSIS — F17210 Nicotine dependence, cigarettes, uncomplicated: Secondary | ICD-10-CM

## 2023-05-20 DIAGNOSIS — Z23 Encounter for immunization: Secondary | ICD-10-CM | POA: Diagnosis not present

## 2023-05-20 DIAGNOSIS — Z1159 Encounter for screening for other viral diseases: Secondary | ICD-10-CM | POA: Diagnosis not present

## 2023-05-20 MED ORDER — ZOSTER VAC RECOMB ADJUVANTED 50 MCG/0.5ML IM SUSR
0.5000 mL | Freq: Once | INTRAMUSCULAR | 0 refills | Status: DC
Start: 2023-05-20 — End: 2023-10-05
  Filled 2023-05-20: qty 0.5, 1d supply, fill #0

## 2023-05-20 MED ORDER — PREDNISONE 20 MG PO TABS
40.0000 mg | ORAL_TABLET | Freq: Every day | ORAL | 0 refills | Status: DC
Start: 2023-05-20 — End: 2023-05-25
  Filled 2023-05-20: qty 10, 5d supply, fill #0

## 2023-05-20 NOTE — Progress Notes (Signed)
Patient ID: [name] [name], female    DOB: 1956/06/13  MRN: [id number]  CC: Follow-up (Follow-up./Cough, SOB - COPD flare-up/Yes to flu & shingles vax)   Subjective: [name] [name] is a 67 y.o. female who presents for chronic ds management. Her concerns today include:  Patient with history of pre-DM, GERD, hypothyroidism, HTN, HL, CAD with previous stent, COPD, substance abuse, depression/anxiety and tobacco dependence, essential thrombocytosis, iron deficiency anemia   Discussed the use of AI scribe software for clinical note transcription with the patient, who gave verbal consent to proceed.  History of Present Illness   The patient, with a history of COPD, presents with chest congestion and difficulty breathing for the past week. She reports increased use of her nebulizer and albuterol inhaler, up to six times a day for the Albuterol inhaler, due to feeling winded and struggling to catch her breath. She notes that cooking irritates her lungs. She denies increased phlegm production. She has been out of her Trelegy inhaler for two weeks, was not aware that RF was sent 05/09/2023 to [facility]. The patient continues to smoke cigarettes despite her COPD. She found some nicotine patches and plans to try using them again.  The patient also has a history of thyroid disease and is currently taking levothyroxine 112 micrograms daily. She is due for labs to check her thyroid levels.  In addition, the patient has a history of heart disease/HTN and is on aspirin, Brilinta, amlodipine 5mg  daily, atorvastatin 80mg  daily, furosemide 20mg  daily, and Bystolic 10mg  daily.  She is taking her medicines consistently.  She denies any chest pain or use of nitroglycerin.  No lower extremity edema.  Lastly, the patient has a history of iron deficiency and was seen by an oncologist. Her last ferritin level checked in September was within the normal range. She reports that her hemoglobin and PLT count were a  little higher than they should be, which the oncologist attributed to cigarette smoking.  Patient thinks she snores.  However daughter has never told her that she snores loud or stops breathing in her sleep.  She sometimes wakes up feeling unrefreshed.  Denies any daytime sleepiness.     Patient Active Problem List   Diagnosis Date Noted   Nicotine dependence 05/03/2023   Left ankle sprain 04/17/2023   Substance abuse (HCC) 09/10/2022   Nontraumatic complete tear of right rotator cuff    Superior glenoid labrum lesion of left shoulder    Iron deficiency anemia 11/02/2021   Major depressive disorder, recurrent, in full remission with anxious distress (HCC) 05/31/2021   Rotator cuff tendinitis, right 05/08/2021   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   Chronic neck pain 02/26/2021   GAD (generalized anxiety disorder) 01/08/2021   Acute cystitis without hematuria 11/07/2020   Influenza vaccine needed 06/15/2020   Abdominal pain, epigastric 04/28/2019   Esophageal dysphagia 04/28/2019   Bipolar I disorder (HCC) 09/17/2018   Insomnia 10/28/2017   Prediabetes 05/28/2017   QT prolongation 03/25/2017   Encounter for screening mammogram for breast cancer 09/11/2015   Psoriasis 06/22/2015   COPD (chronic obstructive pulmonary disease) (HCC)GOLD E 05/18/2015   Anxiety and depression 07/10/2013   Essential hypertension, benign 06/07/2013   Dyslipidemia 06/07/2013   Asthma, chronic 06/07/2013   Emphysema lung (HCC) 06/07/2013   Chronic back pain 06/07/2013   CAD S/P percutaneous coronary angioplasty 06/07/2013   GERD (gastroesophageal reflux disease) 06/07/2013   Breast lump on left side at 3 o'clock position 10/13/2012  S/P abdominal hysterectomy and right salpingo-oophorectomy 08/29/2011   TOBACCO ABUSE 07/17/2009   Chronic rhinitis 07/17/2009   Lung nodule < 6cm on CT 07/17/2009   ALLERGY, FOOD 07/17/2009   Hypothyroidism 12/22/2008     Current Outpatient Medications on  File Prior to Visit  Medication Sig Dispense Refill   albuterol (VENTOLIN HFA) 108 (90 Base) MCG/ACT inhaler Inhale 2 puffs into the lungs every 6 (six) hours as needed for wheezing or shortness of breath. 18 g 12   alendronate (FOSAMAX) 35 MG tablet Take 1 tablet (35 mg total) by mouth every 7 (seven) days. Take with a full glass of water on an empty stomach. Sit upright for 30-60 mins after taking 12 tablet 1   amLODipine (NORVASC) 5 MG tablet Take 1 tablet (5 mg total) by mouth daily. 90 tablet 3   aspirin EC 81 MG tablet Take 81 mg by mouth daily.     atorvastatin (LIPITOR) 80 MG tablet Take 1 tablet (80 mg total) by mouth daily, please schedule appointment for more refills 90 tablet 3   Blood Glucose Monitoring Suppl (TRUE METRIX METER) DEVI 1 kit by Does not apply route 4 (four) times daily. 1 Device 0   busPIRone (BUSPAR) 15 MG tablet Take 2 tablets (30 mg total) by mouth in the morning AND 1 tablet (15 mg total) daily at 12 noon AND 1 tablet (15 mg total) every evening. 120 tablet 3   clonazePAM (KLONOPIN) 0.5 MG tablet Take 1 tablet (0.5 mg total) by mouth 2 (two) times daily as needed for anxiety. 60 tablet 1   doxepin (SINEQUAN) 75 MG capsule Take 1 capsule (75 mg total) by mouth at bedtime. 30 capsule 3   escitalopram (LEXAPRO) 20 MG tablet Take 1 tablet (20 mg total) by mouth daily. 30 tablet 3   fluticasone (FLONASE) 50 MCG/ACT nasal spray Place 2 sprays into both nostrils daily. 16 g 0   furosemide (LASIX) 20 MG tablet Take 1 tablet (20 mg total) by mouth daily. 90 tablet 3   gabapentin (NEURONTIN) 600 MG tablet TAKE 1 TABLET BY MOUTH 4 TIMES DAILY 360 tablet 2   glucose blood (TRUE METRIX BLOOD GLUCOSE TEST) test strip Use as instructed 100 each 12   ipratropium-albuterol (DUONEB) 0.5-2.5 (3) MG/3ML SOLN TAKE 3 MLS VIA NEBULIZATION EVERY 6 HOURS 360 mL 1   levothyroxine (SYNTHROID) 112 MCG tablet Take 1 tablet (112 mcg total) by mouth daily. 90 tablet 2   loperamide (IMODIUM A-D) 2  MG tablet Take 4 mg by mouth 4 (four) times daily as needed for diarrhea or loose stools.     losartan (COZAAR) 50 MG tablet Take 1.5 tablets (75 mg total) by mouth daily. 135 tablet 3   metFORMIN (GLUCOPHAGE) 500 MG tablet Take 1 tablet (500 mg total) by mouth 2 (two) times daily with a meal. 180 tablet 1   mirtazapine (REMERON) 45 MG tablet Take 1 tablet (45 mg total) by mouth at bedtime. 30 tablet 3   montelukast (SINGULAIR) 10 MG tablet Take 1 tablet (10 mg total) by mouth at bedtime. 90 tablet 2   nebivolol (BYSTOLIC) 10 MG tablet Take 1 tablet (10 mg total) by mouth daily. 90 tablet 3   omeprazole (PRILOSEC) 40 MG capsule Take 1 capsule (40 mg total) by mouth 2 (two) times daily before a meal. 60 capsule 5   sodium chloride (OCEAN) 0.65 % SOLN nasal spray Place 1 spray into both nostrils as needed for congestion.  Spacer/Aero-Holding Chambers (AEROCHAMBER MV) inhaler Use as instructed 1 each 1   ticagrelor (BRILINTA) 60 MG TABS tablet Take 1 tablet (60 mg total) by mouth 2 (two) times daily. 60 tablet 52   TRELEGY ELLIPTA 100-62.5-25 MCG/ACT AEPB INHALE 1 PUFF into THE lungs DAILY 60 each 10   triamcinolone (KENALOG) 0.025 % ointment Apply 1 Application topically 2 (two) times daily as needed. 80 g 0   TRUEPLUS LANCETS 28G MISC 28 g by Does not apply route QID. 120 each 2   nitroGLYCERIN (NITROSTAT) 0.4 MG SL tablet Place 1 tablet (0.4 mg total) under the tongue every 5 (five) minutes as needed for chest pain. 25 tablet 3   No current facility-administered medications on file prior to visit.    Allergies  Allergen Reactions   Avocado Anaphylaxis   Latex Shortness Of Breath and Rash   Codeine Nausea Only    Social History   Socioeconomic History   Marital status: Divorced    Spouse name: Not on file   Number of children: 1   Years of education: Not on file   Highest education level: 9th grade  Occupational History   Not on file  Tobacco Use   Smoking status: Every Day     Current packs/day: 0.50    Average packs/day: 0.5 packs/day for 50.0 years (25.0 ttl pk-yrs)    Types: Cigarettes   Smokeless tobacco: Never  Vaping Use   Vaping status: Some Days   Substances: Nicotine  Substance and Sexual Activity   Alcohol use: Yes    Comment: once a week   Drug use: Yes    Types: Marijuana    Comment: history of cocaine use, been about a year, per patient (12/18/21)   Sexual activity: Not Currently    Birth control/protection: Surgical  Other Topics Concern   Not on file  Social History Narrative   Not on file   Social Determinants of Health   Financial Resource Strain: Low Risk  (12/24/2022)   Overall Financial Resource Strain (CARDIA)    Difficulty of Paying Living Expenses: Not hard at all  Food Insecurity: No Food Insecurity (12/24/2022)   Hunger Vital Sign    Worried About Running Out of Food in the Last Year: Never true    Ran Out of Food in the Last Year: Never true  Transportation Needs: No Transportation Needs (12/24/2022)   PRAPARE - Administrator, Civil Service (Medical): No    Lack of Transportation (Non-Medical): No  Physical Activity: Inactive (12/24/2022)   Exercise Vital Sign    Days of Exercise per Week: 0 days    Minutes of Exercise per Session: 0 min  Stress: No Stress Concern Present (12/24/2022)   [employer] of Occupational Health - Occupational Stress Questionnaire    Feeling of Stress : Only a little  Social Connections: Moderately Isolated (12/24/2022)   Social Connection and Isolation Panel [NHANES]    Frequency of Communication with Friends and Family: More than three times a week    Frequency of Social Gatherings with Friends and Family: Three times a week    Attends Religious Services: 1 to 4 times per year    Active Member of Clubs or Organizations: No    Attends Banker Meetings: Never    Marital Status: Divorced  Catering manager Violence: Not At Risk (12/24/2022)   Humiliation, Afraid, Rape,  and Kick questionnaire    Fear of Current or Ex-Partner: No    Emotionally Abused:  No    Physically Abused: No    Sexually Abused: No    Family History  Problem Relation Age of Onset   Heart disease Mother    Hypertension Mother    Stroke Mother    Mental illness Mother    Heart disease Father    Hypertension Father    Diabetes Father    Colon polyps Brother    Heart disease Maternal Grandfather    Cancer Paternal Grandmother        mouth   Anesthesia problems Daughter    Breast cancer Maternal Aunt    Throat cancer Maternal Uncle    Thyroid disease Paternal Aunt    Hypotension Neg Hx    Malignant hyperthermia Neg Hx    Pseudochol deficiency Neg Hx    Colon cancer Neg Hx    Stomach cancer Neg Hx    Esophageal cancer Neg Hx    Pancreatic cancer Neg Hx    Rectal cancer Neg Hx     Past Surgical History:  Procedure Laterality Date   ABDOMINAL EXPLORATION SURGERY  1977   ABDOMINAL HYSTERECTOMY  1977   "left one of my ovaries"   BACK SURGERY     BIOPSY  05/27/2019   Procedure: BIOPSY;  Surgeon: Corbin Ade, MD;  Location: [facility];  Service: Endoscopy;;   BREAST BIOPSY Left 11/2021   x2   COLONOSCOPY  2011   Dr. Christella Hartigan: Mild diverticulosis, descending diminutive colon polyp (not retrieved), next colonoscopy 10 years   CORONARY ANGIOPLASTY WITH STENT PLACEMENT  2011 X2   "regular stents didn't work; had to go back in in ~ 1 month and put in medicated stents"   DILATION AND CURETTAGE OF UTERUS     ESOPHAGOGASTRODUODENOSCOPY     ESOPHAGOGASTRODUODENOSCOPY (EGD) WITH PROPOFOL N/A 05/27/2019   normal esophagus, dilation, erosive gastropathy s/p biopsy, normal duodenum. Negative H.pylori.    LEFT HEART CATH AND CORONARY ANGIOGRAPHY N/A 03/26/2017   Procedure: LEFT HEART CATH AND CORONARY ANGIOGRAPHY;  Surgeon: Lyn Records, MD;  Location: [facility];  Service: Cardiovascular;  Laterality: N/A;   LUMBAR LAMINECTOMY/DECOMPRESSION MICRODISCECTOMY  08/05/2011    Procedure: LUMBAR LAMINECTOMY/DECOMPRESSION MICRODISCECTOMY;  Surgeon: Clydene Fake, MD;  Location: [facility];  Service: Neurosurgery;  Laterality: Right;  Right Lumbar four-five extraforaminal discectomy   MALONEY DILATION N/A 05/27/2019   Procedure: Elease Hashimoto DILATION;  Surgeon: Corbin Ade, MD;  Location: [facility];  Service: Endoscopy;  Laterality: N/A;  54   SHOULDER ARTHROSCOPY WITH ROTATOR CUFF REPAIR AND OPEN BICEPS TENODESIS Right 12/24/2021   Procedure: RIGHT SHOULDER ARTHROSCOPY WITH ROTATOR CUFF REPAIR AND BICEPS TENODESIS;  Surgeon: Eldred Manges, MD;  Location: [facility] OR;  Service: Orthopedics;  Laterality: Right;   TONSILLECTOMY     as a child   UPPER GASTROINTESTINAL ENDOSCOPY      ROS: Review of Systems Negative except as stated above  PHYSICAL EXAM: BP 120/76 (BP Location: Left Arm, Patient Position: Sitting, Cuff Size: Normal)   Pulse 62   Temp 98.2 F (36.8 C) (Oral)   Ht 5\' 1"  (1.549 m)   Wt 148 lb (67.1 kg)   SpO2 93%   BMI 27.96 kg/m   Physical Exam Pulse ox of 93% is on room air. General appearance - alert, well appearing, older Caucasian female and in no distress Mental status - normal mood, behavior, speech, dress, motor activity, and thought processes Neck - supple, no significant adenopathy Chest -patient does not appear  dyspneic.  Mild diffuse wheezing with adequate air entry. Heart - normal rate, regular rhythm, normal S1, S2, no murmurs, rubs, clicks or gallops Extremities - peripheral pulses normal, no pedal edema, no clubbing or cyanosis      Latest Ref Rng & Units 03/24/2023   12:32 PM 03/04/2023    3:43 PM 11/20/2022    1:31 PM  CMP  Glucose 70 - 99 mg/dL 84  74  161   BUN 8 - 23 mg/dL 10  13  13    Creatinine 0.44 - 1.00 mg/dL 0.96  0.45  4.09   Sodium 135 - 145 mmol/L 138  139  142   Potassium 3.5 - 5.1 mmol/L 3.5  4.9  4.0   Chloride 98 - 111 mmol/L 103  100  108   CO2 22 - 32 mmol/L 28  26  29    Calcium 8.9 - 10.3 mg/dL 9.3   9.7  9.8   Total Protein 6.5 - 8.1 g/dL 7.2   7.2   Total Bilirubin 0.3 - 1.2 mg/dL 0.4   0.6   Alkaline Phos 38 - 126 U/L 103   115   AST 15 - 41 U/L 19   13   ALT 0 - 44 U/L 23   14    Lipid Panel     Component Value Date/Time   CHOL 229 (H) 03/21/2022 1218   TRIG 99 03/21/2022 1218   HDL 76 03/21/2022 1218   CHOLHDL 3.0 03/21/2022 1218   CHOLHDL 3.8 04/16/2016 0934   VLDL 15 04/16/2016 0934   LDLCALC 136 (H) 03/21/2022 1218    CBC    Component Value Date/Time   WBC 9.0 03/24/2023 1232   WBC 7.1 11/28/2021 1645   RBC 4.91 03/24/2023 1232   HGB 16.1 (H) 03/24/2023 1232   HGB 15.1 01/17/2023 1138   HCT 47.5 (H) 03/24/2023 1232   HCT 45.8 01/17/2023 1138   PLT 426 (H) 03/24/2023 1232   PLT 420 01/17/2023 1138   MCV 96.7 03/24/2023 1232   MCV 97 01/17/2023 1138   MCH 32.8 03/24/2023 1232   MCHC 33.9 03/24/2023 1232   RDW 14.1 03/24/2023 1232   RDW 13.2 01/17/2023 1138   LYMPHSABS 2.2 03/24/2023 1232   LYMPHSABS 2.6 04/05/2019 0926   MONOABS 0.8 03/24/2023 1232   EOSABS 0.1 03/24/2023 1232   EOSABS 0.3 04/05/2019 0926   BASOSABS 0.0 03/24/2023 1232   BASOSABS 0.1 04/05/2019 0926    ASSESSMENT AND PLAN: 1. COPD exacerbation (HCC) Strongly encouraged to discontinue smoking. Will give a course of prednisone for 5 days. Pick up Trelegy prescription from her pharmacy. Use nebulizer treatments as needed - predniSONE (DELTASONE) 20 MG tablet; Take 2 tablets (40 mg total) by mouth daily for 5 days.  Dispense: 10 tablet; Refill: 0  2. TOBACCO ABUSE See #1 above.  Patient states she tries but it is difficult.  She has some nicotine patches at home and plans to start using them again.  3. Essential hypertension At goal.  Continue nebivolol 10 mg daily.  4. Coronary artery disease involving native coronary artery of native heart without angina pectoris Stable.  Continue aspirin, Brilinta 60 mg twice a day, atorvastatin 80 mg daily, nebivolol 10 mg daily  5. Acquired  hypothyroidism Continue levothyroxine 112 mcg daily - TSH  6. Polycythemia Likely due to to cigarette smoking and intermittent relative hypoxia associated with COPD.   Encouraged to discontinue smoking. Ask her to find out from her daughter whether she  snores loud and/or have intermittent episodes where she stops breathing in her sleep. - CBC  7. Need for hepatitis C screening test For both to screening. - Hepatitis C Antibody  8. Encounter for immunization Given today. - Flu Vaccine Trivalent High Dose (Fluad)  9. Need for shingles vaccine Printed prescription given for Shingrix vaccine for her to take to the pharmacy to have filled and administered. - Zoster Vaccine Adjuvanted [facility]) injection; Inject 0.5 mLs into the muscle once for 1 dose.  Dispense: 0.5 mL; Refill: 0   Patient was given the opportunity to ask questions.  Patient verbalized understanding of the plan and was able to repeat key elements of the plan.   This documentation was completed using Paediatric nurse.  Any transcriptional errors are unintentional.  Orders Placed This Encounter  Procedures   Flu Vaccine Trivalent High Dose (Fluad)   TSH   CBC   Hepatitis C Antibody     Requested Prescriptions   Signed Prescriptions Disp Refills   predniSONE (DELTASONE) 20 MG tablet 10 tablet 0    Sig: Take 2 tablets (40 mg total) by mouth daily for 5 days.   Zoster Vaccine Adjuvanted [facility]) injection 0.5 mL 0    Sig: Inject 0.5 mLs into the muscle once for 1 dose.    Return in about 4 months (around 09/17/2023).  Jonah Blue, MD, FACP

## 2023-05-20 NOTE — Telephone Encounter (Signed)
   Pre-operative Risk Assessment    Patient Name: [name] [name]  DOB: 1956-05-01 MRN: [id number]  Last office visit : 09/18/2022 Upcoming appointment : 05/28/2023     Request for Surgical Clearance    Procedure:   Vitrectomy surgery under MAC anesthesia  Date of Surgery:  Clearance 06/02/23                           Surgeon:  Stephannie Li, MD Surgeon's Group or Practice Name:  [facility]  Phone number:  [phone] Fax number:  [phone]   Type of Clearance Requested:   - Medical  - Pharmacy:  Hold Aspirin and Ticagrelor (Brilinta) : Stop all blood thinners 3 days prior to surgery.   Type of Anesthesia:  MAC   Additional requests/questions:    Elyse Jarvis   05/20/2023, 9:09 AM

## 2023-05-20 NOTE — Telephone Encounter (Signed)
Dr. 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 Per office protocol, will you please provide recommendations for holding Brilinta and aspirin prior to vitrectomy on 11/18?   Please route your response to P CV DIV Preop. I will communicate with requesting office once you have given recommendations.   Thank you!  Carlos Levering, NP

## 2023-05-21 ENCOUNTER — Other Ambulatory Visit (HOSPITAL_COMMUNITY): Payer: Self-pay

## 2023-05-21 ENCOUNTER — Other Ambulatory Visit: Payer: Self-pay

## 2023-05-21 LAB — CBC
Hematocrit: 44.2 % (ref 34.0–46.6)
Hemoglobin: 14.8 g/dL (ref 11.1–15.9)
MCH: 33.2 pg — ABNORMAL HIGH (ref 26.6–33.0)
MCHC: 33.5 g/dL (ref 31.5–35.7)
MCV: 99 fL — ABNORMAL HIGH (ref 79–97)
Platelets: 373 10*3/uL (ref 150–450)
RBC: 4.46 x10E6/uL (ref 3.77–5.28)
RDW: 13.2 % (ref 11.7–15.4)
WBC: 9.3 10*3/uL (ref 3.4–10.8)

## 2023-05-21 LAB — TSH: TSH: 1.79 u[IU]/mL (ref 0.450–4.500)

## 2023-05-21 LAB — HEPATITIS C ANTIBODY: Hep C Virus Ab: NONREACTIVE

## 2023-05-28 ENCOUNTER — Encounter: Payer: Self-pay | Admitting: Cardiovascular Disease

## 2023-05-28 ENCOUNTER — Ambulatory Visit: Payer: Medicare HMO | Admitting: Cardiovascular Disease

## 2023-05-28 ENCOUNTER — Ambulatory Visit: Payer: Medicare HMO | Attending: Cardiovascular Disease | Admitting: Cardiovascular Disease

## 2023-05-28 VITALS — BP 128/74 | HR 60 | Ht <= 58 in | Wt 147.6 lb

## 2023-05-28 DIAGNOSIS — I10 Essential (primary) hypertension: Secondary | ICD-10-CM | POA: Diagnosis not present

## 2023-05-28 DIAGNOSIS — Z01810 Encounter for preprocedural cardiovascular examination: Secondary | ICD-10-CM

## 2023-05-28 DIAGNOSIS — F17200 Nicotine dependence, unspecified, uncomplicated: Secondary | ICD-10-CM

## 2023-05-28 DIAGNOSIS — E119 Type 2 diabetes mellitus without complications: Secondary | ICD-10-CM

## 2023-05-28 DIAGNOSIS — I2510 Atherosclerotic heart disease of native coronary artery without angina pectoris: Secondary | ICD-10-CM | POA: Diagnosis not present

## 2023-05-28 DIAGNOSIS — Z9861 Coronary angioplasty status: Secondary | ICD-10-CM | POA: Diagnosis not present

## 2023-05-28 DIAGNOSIS — J45909 Unspecified asthma, uncomplicated: Secondary | ICD-10-CM | POA: Diagnosis not present

## 2023-05-28 DIAGNOSIS — R0609 Other forms of dyspnea: Secondary | ICD-10-CM

## 2023-05-28 DIAGNOSIS — E785 Hyperlipidemia, unspecified: Secondary | ICD-10-CM | POA: Diagnosis not present

## 2023-05-28 NOTE — Progress Notes (Signed)
Cardiology Office Note    Date:  05/30/2023   ID:  [name] [name], DOB [birth year], MRN [id number]  PCP:  [name] Matar, MD  Cardiologist:  Nicki Guadalajara, MD   36-month follow-up evaluation   History of Present Illness:  [name] [name] is a 67 y.o. female who presents for follow-up cardiology evaluation.  I last saw her on March 11, 2022.  She presents for a follow-up evaluation.  Ms. [name] [name] has known coronary artery disease in 2001 underwent initial stenting of her right coronary artery but Dr. Allyson Sabal with placement of bare-metal stent. The following day, I perform staging intervention to proximal and mid LAD, as well as [place] arthrotomy to the ostium of her diagonal vessel. 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 She continues to have bare-metal stents in the LAD and diagonal system. She has a history of hypertension, hyperlipidemia, as well as hypothyroidism.   In September 2013 she saw [name] [name]. At that time, she was noticing some claudication symptoms and also was experiencing some lower extremity edema.   She has a history of depression and anxiety and is followed at the [facility] mental [facility]. Apparently she was evaluated on 07/09/2013 after experiencing a one-week history of increasing depression, anxiety, tearfulness and suicidal at a ration. She had been drinking to excess and also had used crack cocaine. She also had been hearing voices.   When I last saw her in January 2015 she was experiencing episodes of chest discomfort. These often are nonexertional. She is still smoking. She was hospitalized at behavioral health for one week and was is in outpatient treatment.  Over the last 6 years, she has continued to smoke cigarettes and has been smoking for at least 50 years.  She has COPD and had seen Dr. Maple Hudson in the past and more  recently has been seeing Dr. Shan Levans at [facility].  She developed an Covid infection and has been followed by Dr. Delford [name].  She also is followed with behavioral therapist for severe anxiety syndrome and has been on Lexipro.   In April 2021 she was evaluated by Judy Pimple, PA-C at our office.  During her lost to follow-up time, she had undergone a cardiac catheterization in September 2018 which was done by Dr. Katrinka [name] which showed chronic total occlusion of the RCA due to in-stent restenosis with left-to-right collaterals, diffuse 50% narrowing in the proximal LAD, diffuse 40 to 50% in-stent restenosis in the first diagonal, 70% mid OM 2 stenosis, and 50% proximal OM 3 stenosis.  EF was 65%.  Aggressive medical therapy was recommended.  An echo Doppler study showed an EF of 55 to 60% with grade 1 diastolic dysfunction and without regional wall motion abnormalities.  She is an hyporesponder to Plavix and had in-stent restenosis with Plavix and was therefore switched to Brilinta.  In May 2021 she underwent a Lexiscan Myoview study which was low risk and showed normal perfusion.  She has a history of hypothyroidism and is on levothyroxine.   At her last visit with me on February 10, 2020, blood pressure was controlled on a regimen consisting of amlodipine 5 mg, furosemide 20 mg, nebivolol 10 mg, and losartan 75 mg.  She continues to be on aspirin and full dose ticagrelor.  During that evaluation I suggested she reduce her  Brilinta dose to 60 mg twice a day.  Remotely she was found to be a hyper responder to Plavix and for this reason ticagrelor continuation was recommended.  Continues to smoke cigarettes and had a long discussion with her regarding smoking cessation.  Since I saw her, she was evaluated by Edd Fabian in April 2023 for preoperative clearance prior to undergoing right shoulder surgery by Dr. Ophelia [name]. She was given cardiac clearance unfortunately she was  still smoking a pack per day.  She was without any anginal symptomatology.  I last saw her on March 11, 2022. She comes to the office today with her daughter who she has been living with since her right shoulder surgery.  Unfortunately she continues to smoke cigarettes and is now smoking half pack per day.  She started smoking at age 63 and has been smoking for 54 years.  She continues to be on amlodipine 5 mg, furosemide 20 mg, losartan 50 mg, and has been taking nebivolol 10 mg daily.  She has COPD and is on Trelegy Ellipta, albuterol and DuoNeb.  She continues to be on levothyroxine 112 mcg for hypothyroidism and takes gabapentin for neuropathy.  She is on Lexapro for anxiety.  During that evaluation, I again had a long discussion regarding the importance of smoking cessation.  I recommended a follow-up echo Doppler study for reassessment of LV function.  I also recommended repeat laboratory including LP(a).  Since I last saw her, she unfortunately continues to smoke cigarettes despite her COPD.  She is in need to undergo preoperative clearance prior to undergoing cataract surgery.  She admits to shortness of breath and becomes more winded with exertion.  She is followed at the cancer center and has been undergoing iron infusions for iron deficiency anemia.  She denies chest tightness or pressure.  She continues to be on amlodipine 5 mg, furosemide 20 mg, losartan 50 mg, nebivolol 10 mg for hypertension and CAD.  She continues to be on aspirin and reduced dose Brilinta at 60 mg twice a day and denies any significant bleeding issues.  She is on Trelegy Ellipta albuterol and DuoNeb for COPD.  She takes BuSpar, Klonopin.  She presents for evaluation.  Past Medical History:  Diagnosis Date   Allergy    seasonal   Anemia    Anxiety    takes Lexapro daily   Arthritis    "back, from neck down pass my bra area" (03/25/2017)   Asthma    Bartholin gland cyst 08/29/2011   Bruises easily    pt is on Effient    Chronic back pain    herniated nucleus pulposus   Chronic back pain    "neck to bra area; lower back" (03/25/2017)   Chronic kidney disease    recurrent UTI's this year 2022   COPD (chronic obstructive pulmonary disease) (HCC)    early stages   Coronary artery disease    Depression    takes Klonopin daily   Diabetes mellitus without complication (HCC)    Diverticulosis    Fibroadenoma of left breast    GERD (gastroesophageal reflux disease)    takes Nexium daily   H/O hiatal hernia    Heart attack (HCC) 2011   Hemorrhoids    Hernia    Hyperlipidemia    takes Lipitor daily   Hypertension    takes Losartan daily and Labetalol bid   Hypothyroidism    takes Synthroid daily   Insomnia    hydroxyzine prn   Joint  pain    Pneumonia    "couple times" (03/25/2017)   Pre-diabetes    "just found out 1 wk ago" (03/25/2017)   Psoriasis    elbows,knees,back   Shortness of breath    with exertion   Slowing of urinary stream    Stress incontinence     Past Surgical History:  Procedure Laterality Date   ABDOMINAL EXPLORATION SURGERY  1977   ABDOMINAL HYSTERECTOMY  1977   "left one of my ovaries"   BACK SURGERY     BIOPSY  05/27/2019   Procedure: BIOPSY;  Surgeon: Corbin Ade, MD;  Location: [facility];  Service: Endoscopy;;   BREAST BIOPSY Left 11/2021   x2   COLONOSCOPY  2011   Dr. Christella Hartigan: Mild diverticulosis, descending diminutive colon polyp (not retrieved), next colonoscopy 10 years   CORONARY ANGIOPLASTY WITH STENT PLACEMENT  2011 X2   "regular stents didn't work; had to go back in in ~ 1 month and put in medicated stents"   DILATION AND CURETTAGE OF UTERUS     ESOPHAGOGASTRODUODENOSCOPY     ESOPHAGOGASTRODUODENOSCOPY (EGD) WITH PROPOFOL N/A 05/27/2019   normal esophagus, dilation, erosive gastropathy s/p biopsy, normal duodenum. Negative H.pylori.    LEFT HEART CATH AND CORONARY ANGIOGRAPHY N/A 03/26/2017   Procedure: LEFT HEART CATH AND CORONARY ANGIOGRAPHY;   Surgeon: Lyn Records, MD;  Location: [facility];  Service: Cardiovascular;  Laterality: N/A;   LUMBAR LAMINECTOMY/DECOMPRESSION MICRODISCECTOMY  08/05/2011   Procedure: LUMBAR LAMINECTOMY/DECOMPRESSION MICRODISCECTOMY;  Surgeon: Clydene Fake, MD;  Location: [facility];  Service: Neurosurgery;  Laterality: Right;  Right Lumbar four-five extraforaminal discectomy   MALONEY DILATION N/A 05/27/2019   Procedure: Elease Hashimoto DILATION;  Surgeon: Corbin Ade, MD;  Location: [facility];  Service: Endoscopy;  Laterality: N/A;  54   SHOULDER ARTHROSCOPY WITH ROTATOR CUFF REPAIR AND OPEN BICEPS TENODESIS Right 12/24/2021   Procedure: RIGHT SHOULDER ARTHROSCOPY WITH ROTATOR CUFF REPAIR AND BICEPS TENODESIS;  Surgeon: Eldred Manges, MD;  Location: [facility] OR;  Service: Orthopedics;  Laterality: Right;   TONSILLECTOMY     as a child   UPPER GASTROINTESTINAL ENDOSCOPY      Current Medications: Outpatient Medications Prior to Visit  Medication Sig Dispense Refill   albuterol (VENTOLIN HFA) 108 (90 Base) MCG/ACT inhaler Inhale 2 puffs into the lungs every 6 (six) hours as needed for wheezing or shortness of breath. 18 g 12   alendronate (FOSAMAX) 35 MG tablet Take 1 tablet (35 mg total) by mouth every 7 (seven) days. Take with a full glass of water on an empty stomach. Sit upright for 30-60 mins after taking 12 tablet 1   amLODipine (NORVASC) 5 MG tablet Take 1 tablet (5 mg total) by mouth daily. 90 tablet 3   aspirin EC 81 MG tablet Take 81 mg by mouth daily.     atorvastatin (LIPITOR) 80 MG tablet Take 1 tablet (80 mg total) by mouth daily, please schedule appointment for more refills 90 tablet 3   Blood Glucose Monitoring Suppl (TRUE METRIX METER) DEVI 1 kit by Does not apply route 4 (four) times daily. 1 Device 0   busPIRone (BUSPAR) 15 MG tablet Take 2 tablets (30 mg total) by mouth in the morning AND 1 tablet (15 mg total) daily at 12 noon AND 1 tablet (15 mg total) every evening. 120 tablet 3    clonazePAM (KLONOPIN) 0.5 MG tablet Take 1 tablet (0.5 mg total) by mouth 2 (two) times daily  as needed for anxiety. 60 tablet 1   doxepin (SINEQUAN) 75 MG capsule Take 1 capsule (75 mg total) by mouth at bedtime. 30 capsule 3   escitalopram (LEXAPRO) 20 MG tablet Take 1 tablet (20 mg total) by mouth daily. 30 tablet 3   furosemide (LASIX) 20 MG tablet Take 1 tablet (20 mg total) by mouth daily. 90 tablet 3   gabapentin (NEURONTIN) 600 MG tablet TAKE 1 TABLET BY MOUTH 4 TIMES DAILY 360 tablet 2   glucose blood (TRUE METRIX BLOOD GLUCOSE TEST) test strip Use as instructed 100 each 12   ipratropium-albuterol (DUONEB) 0.5-2.5 (3) MG/3ML SOLN TAKE 3 MLS VIA NEBULIZATION EVERY 6 HOURS 360 mL 1   levothyroxine (SYNTHROID) 112 MCG tablet Take 1 tablet (112 mcg total) by mouth daily. 90 tablet 2   loperamide (IMODIUM A-D) 2 MG tablet Take 4 mg by mouth 4 (four) times daily as needed for diarrhea or loose stools.     losartan (COZAAR) 50 MG tablet Take 1.5 tablets (75 mg total) by mouth daily. 135 tablet 3   metFORMIN (GLUCOPHAGE) 500 MG tablet Take 1 tablet (500 mg total) by mouth 2 (two) times daily with a meal. 180 tablet 1   mirtazapine (REMERON) 45 MG tablet Take 1 tablet (45 mg total) by mouth at bedtime. 30 tablet 3   montelukast (SINGULAIR) 10 MG tablet Take 1 tablet (10 mg total) by mouth at bedtime. 90 tablet 2   nebivolol (BYSTOLIC) 10 MG tablet Take 1 tablet (10 mg total) by mouth daily. 90 tablet 3   omeprazole (PRILOSEC) 40 MG capsule Take 1 capsule (40 mg total) by mouth 2 (two) times daily before a meal. 60 capsule 5   sodium chloride (OCEAN) 0.65 % SOLN nasal spray Place 1 spray into both nostrils as needed for congestion.     Spacer/Aero-Holding Chambers (AEROCHAMBER MV) inhaler Use as instructed 1 each 1   ticagrelor (BRILINTA) 60 MG TABS tablet Take 1 tablet (60 mg total) by mouth 2 (two) times daily. 60 tablet 52   TRELEGY ELLIPTA 100-62.5-25 MCG/ACT AEPB INHALE 1 PUFF into THE lungs  DAILY 60 each 10   triamcinolone (KENALOG) 0.025 % ointment Apply 1 Application topically 2 (two) times daily as needed. 80 g 0   TRUEPLUS LANCETS 28G MISC 28 g by Does not apply route QID. 120 each 2   fluticasone (FLONASE) 50 MCG/ACT nasal spray Place 2 sprays into both nostrils daily. (Patient not taking: Reported on 05/28/2023) 16 g 0   nitroGLYCERIN (NITROSTAT) 0.4 MG SL tablet Place 1 tablet (0.4 mg total) under the tongue every 5 (five) minutes as needed for chest pain. 25 tablet 3   No facility-administered medications prior to visit.     Allergies:   Avocado, Latex, and Codeine   Social History   Socioeconomic History   Marital status: Divorced    Spouse name: Not on file   Number of children: 1   Years of education: Not on file   Highest education level: 9th grade  Occupational History   Not on file  Tobacco Use   Smoking status: Every Day    Current packs/day: 0.50    Average packs/day: 0.5 packs/day for 50.0 years (25.0 ttl pk-yrs)    Types: Cigarettes   Smokeless tobacco: Never  Vaping Use   Vaping status: Some Days   Substances: Nicotine  Substance and Sexual Activity   Alcohol use: Yes    Comment: once a week   Drug use: Yes  Types: Marijuana    Comment: history of cocaine use, been about a year, per patient (12/18/21)   Sexual activity: Not Currently    Birth control/protection: Surgical  Other Topics Concern   Not on file  Social History Narrative   Not on file   Social Determinants of Health   Financial Resource Strain: Low Risk  (12/24/2022)   Overall Financial Resource Strain (CARDIA)    Difficulty of Paying Living Expenses: Not hard at all  Food Insecurity: No Food Insecurity (12/24/2022)   Hunger Vital Sign    Worried About Running Out of Food in the Last Year: Never true    Ran Out of Food in the Last Year: Never true  Transportation Needs: No Transportation Needs (12/24/2022)   PRAPARE - Administrator, Civil Service (Medical): No     Lack of Transportation (Non-Medical): No  Physical Activity: Inactive (12/24/2022)   Exercise Vital Sign    Days of Exercise per Week: 0 days    Minutes of Exercise per Session: 0 min  Stress: No Stress Concern Present (12/24/2022)   [employer] of Occupational Health - Occupational Stress Questionnaire    Feeling of Stress : Only a little  Social Connections: Moderately Isolated (12/24/2022)   Social Connection and Isolation Panel [NHANES]    Frequency of Communication with Friends and Family: More than three times a week    Frequency of Social Gatherings with Friends and Family: Three times a week    Attends Religious Services: 1 to 4 times per year    Active Member of Clubs or Organizations: No    Attends Banker Meetings: Never    Marital Status: Divorced     Family History:  The patient's family history includes Anesthesia problems in her daughter; Breast cancer in her maternal aunt; Cancer in her paternal grandmother; Colon polyps in her brother; Diabetes in her father; Heart disease in her father, maternal grandfather, and mother; Hypertension in her father and mother; Mental illness in her mother; Stroke in her mother; Throat cancer in her maternal uncle; Thyroid disease in her paternal aunt.   ROS General: Negative; No fevers, chills, or night sweats;  HEENT: Negative; No changes in vision or hearing, sinus congestion, difficulty swallowing Pulmonary: Positive for COPD/asthma Cardiovascular: Negative; No chest pain, presyncope, syncope, palpitations GI: Negative; No nausea, vomiting, diarrhea, or abdominal pain GU: Negative; No dysuria, hematuria, or difficulty voiding Musculoskeletal: Recent right shoulder surgery involving rotator cuff, biceps tendon, and frozen shoulder. Hematologic/Oncology: Negative; no easy bruising, bleeding Endocrine: Positive for hypothyroidism and prediabetes Neuro: Negative; no changes in balance, headaches Skin: Negative; No  rashes or skin lesions Psychiatric: Positive for anxiety Sleep: Negative; No snoring, daytime sleepiness, hypersomnolence, bruxism, restless legs, hypnogognic hallucinations, no cataplexy Other comprehensive 14 point system review is negative.   PHYSICAL EXAM:   VS:  BP 128/74   Pulse 60   Ht 4\' 10"  (1.473 m)   Wt 147 lb 9.6 oz (67 kg)   SpO2 96%   BMI 30.85 kg/m     Repeat blood pressure by me was 135/74.  Wt Readings from Last 3 Encounters:  05/28/23 147 lb 9.6 oz (67 kg)  05/20/23 148 lb (67.1 kg)  03/24/23 144 lb 1.6 oz (65.4 kg)   General: Alert, oriented, no distress.  Skin: normal turgor, no rashes, warm and dry HEENT: Normocephalic, atraumatic. Pupils equal round and reactive to light; sclera anicteric; extraocular muscles intact; Nose without nasal septal hypertrophy Mouth/Parynx benign; Mallinpatti  scale 3 Neck: No JVD, no carotid bruits; normal carotid upstroke Lungs: clear to ausculatation and percussion; no wheezing or rales Chest wall: Decreased breath sounds without wheezing. Heart: PMI not displaced, RRR, s1 s2 normal, 1/6 systolic murmur, no diastolic murmur, no rubs, gallops, thrills, or heaves Abdomen: soft, nontender; no hepatosplenomehaly, BS+; abdominal aorta nontender and not dilated by palpation. Back: no CVA tenderness Pulses 2+ Musculoskeletal: full range of motion, normal strength, no joint deformities Extremities: no clubbing cyanosis or edema, Homan's sign negative  Neurologic: grossly nonfocal; Cranial nerves grossly wnl Psychologic: Normal mood and affect     Studies/Labs Reviewed:   EKG Interpretation Date/Time:  Wednesday May 28 2023 14:30:18 EST Ventricular Rate:  60 PR Interval:  154 QRS Duration:  78 QT Interval:  430 QTC Calculation: 430 R Axis:   75  Text Interpretation: Normal sinus rhythm Normal ECG When compared with ECG of 04-Sep-2022 11:31, QRS duration has decreased Nonspecific T wave abnormality no longer evident in  Lateral leads Confirmed by Nicki Guadalajara ([id number]) on 05/28/2023 2:51:45 PM    March 11, 2022 ECG (independently read by me): NSR at 62, ILBBB,    NST wave abnormality; QTc 477 msec  February 10, 2020 ECG (independently read by me): Personally reviewed from November 08, 2019 which reveals normal sinus rhythm at 85 bpm.  QTc interval 466 ms.  No ectopy.  January 2015 EKG (independently read by me): Normal sinus rhythm at 62 bpm.  No ectopy.  Normal intervals.  No ST segment changes.  Recent Labs:    Latest Ref Rng & Units 03/24/2023   12:32 PM 03/04/2023    3:43 PM 11/20/2022    1:31 PM  BMP  Glucose 70 - 99 mg/dL 84  74  272   BUN 8 - 23 mg/dL 10  13  13    Creatinine 0.44 - 1.00 mg/dL 5.36  6.44  0.34   BUN/Creat Ratio 12 - 28  15    Sodium 135 - 145 mmol/L 138  139  142   Potassium 3.5 - 5.1 mmol/L 3.5  4.9  4.0   Chloride 98 - 111 mmol/L 103  100  108   CO2 22 - 32 mmol/L 28  26  29    Calcium 8.9 - 10.3 mg/dL 9.3  9.7  9.8         Latest Ref Rng & Units 03/24/2023   12:32 PM 11/20/2022    1:31 PM 08/21/2022    1:15 PM  Hepatic Function  Total Protein 6.5 - 8.1 g/dL 7.2  7.2  6.6   Albumin 3.5 - 5.0 g/dL 4.4  4.4  3.9   AST 15 - 41 U/L 19  13  13    ALT 0 - 44 U/L 23  14  13    Alk Phosphatase 38 - 126 U/L 103  115  98   Total Bilirubin 0.3 - 1.2 mg/dL 0.4  0.6  0.3        Latest Ref Rng & Units 05/20/2023   11:19 AM 03/24/2023   12:32 PM 01/17/2023   11:38 AM  CBC  WBC 3.4 - 10.8 x10E3/uL 9.3  9.0  9.3   Hemoglobin 11.1 - 15.9 g/dL 74.2  59.5  63.8   Hematocrit 34.0 - 46.6 % 44.2  47.5  45.8   Platelets 150 - 450 x10E3/uL 373  426  420    Lab Results  Component Value Date   MCV 99 (H) 05/20/2023   MCV 96.7 03/24/2023  MCV 97 01/17/2023   Lab Results  Component Value Date   TSH 1.790 05/20/2023   Lab Results  Component Value Date   HGBA1C 5.7 01/17/2023     BNP    Component Value Date/Time   BNP 97.6 02/11/2021 0416    ProBNP No results found for:  "PROBNP"   Lipid Panel     Component Value Date/Time   CHOL 229 (H) 03/21/2022 1218   TRIG 99 03/21/2022 1218   HDL 76 03/21/2022 1218   CHOLHDL 3.0 03/21/2022 1218   CHOLHDL 3.8 04/16/2016 0934   VLDL 15 04/16/2016 0934   LDLCALC 136 (H) 03/21/2022 1218   LABVLDL 17 03/21/2022 1218     RADIOLOGY: No results found.   Additional studies/ records that were reviewed today include:  I reviewed my records from 58.  Subsequent records in the interim have been reviewed.  ASSESSMENT:    1. Essential hypertension   2. Coronary artery disease involving native coronary artery of native heart without angina pectoris   3. Exertional dyspnea   4. CAD S/P percutaneous coronary angioplasty   5. TOBACCO ABUSE   6. Hyperlipidemia LDL goal <70   7. Preop cardiovascular exam   8. Type 2 diabetes mellitus without complication, without long-term current use of insulin (HCC)   9. Chronic asthma without complication, unspecified asthma severity, unspecified whether persistent      PLAN:  Ms. Cymone Cifarelli is a 67 year old female who has a history of known CAD and is status post PCI/DES stenting to her right coronary artery in 2001 with subsequent staged intervention to her LAD and PCI to her diagonal.  She had developed in-stent restenosis to her RCA.  She has a history of hypertension, hyperlipidemia, diabetes mellitus, COPD with asthma, GERD as well as history of erosive gastropathy.  She underwent repeat cardiac catheterization in 2018 which showed chronic total occlusion of her RCA due to the in-stent restenosis with left-to-right collaterals.  Concomitant disease was noted in the proximal LAD first diagonal as well as circumflex marginal vessels.  Echo Doppler study in 2018 showed normal systolic function with EF 55 to 60% and grade 1 diastolic dysfunction.  Her most recent nuclear perfusion study was in 2021 normal.  She is now on aspirin/low-dose Brilinta 60 mg twice a day and was a hyper  responder to Plavix.  He has COPD-asthma and has been on DuoNeb, Singulair, and is now on Trelegy Ellipta.  She tolerated right shoulder surgery well without cardiovascular compromise.  Her echo Doppler study from September 2023 was essentially normal without change from 2018-60 to 65% and grade 1 diastolic dysfunction.  Patiently, she is noted that she has experienced more shortness of breath particularly with exertion.  Unfortunately she still smokes cigarettes and has been smoking for over 55 years.  I am recommending repeat fasting laboratory with a comprehensive metabolic panel, lipid panel, and LP(a).  I have recommended reassessment of LV function with repeat echocardiography.  I am scheduling her for a nuclear PET scan prior to her planned surgery.  She was advised that she will need to hold Brilinta for 5 days prior to surgery.  And discussed the importance of complete smoking cessation.  She has been undergoing iron infusion per hematology.  I will see her in 2 to 3 months for follow-up evaluation or sooner as needed.   A long discussion with her today regarding the importance of absolute smoking cessation.  She has been smoking for approximately 54 years.  I have recommended she undergo follow-up 2D echo Doppler study for reassessment of LV systolic and diastolic function since she does note some mild shortness of breath which may also be related to her asthma.  She has not had recent lipid studies since 2022.  I am recommending fasting lipid panel and LP(a).  I have reviewed recent other studies from February 18, 2022 which showed normal creatinine at 0.86.  Hemoglobin 15.5.  TSH in April 2023 was normal at 0.502.  I will contact her regarding the results of the above studies and see her in 6 months for reevaluation.  Medication Adjustments/Labs and Tests Ordered: Current medicines are reviewed at length with the patient today.  Concerns regarding medicines are outlined above.  Medication changes,  Labs and Tests ordered today are listed in the Patient Instructions below. Patient Instructions  Medication Instructions:  You have received cardiac clearance for your eye surgery.  You stop the Brilinta 60mg  for 5 prior to the eye surgery.   Discontinue tobacco use.    *If you need a refill on your cardiac medications before your next appointment, please call your pharmacy*   Lab Work: Return for fasting labs If you have labs (blood work) drawn today and your tests are completely normal, you will receive your results only by: MyChart Message (if you have MyChart) OR A paper copy in the mail If you have any lab test that is abnormal or we need to change your treatment, we will call you to review the results.   Testing/Procedures: Your physician has requested that you have an echocardiogram. Echocardiography is a painless test that uses sound waves to create images of your heart. It provides your doctor with information about the size and shape of your heart and how well your heart's chambers and valves are working. This procedure takes approximately one hour. There are no restrictions for this procedure. Please do NOT wear cologne, perfume, aftershave, or lotions (deodorant is allowed).   Please arrive 15 minutes prior to your appointment time.  How to Prepare for Your Cardiac PET/CT Stress Test:  1. Please do not take these medications before your test:   Medications that may interfere with the cardiac pharmacological stress agent (ex. nitrates - including erectile dysfunction medications, isosorbide mononitrate- [please start to hold this medication the day before the test], tamulosin or beta-blockers) the day of the exam. (Erectile dysfunction medication should be held for at least 72 hrs prior to test) Theophylline containing medications for 12 hours. Dipyridamole 48 hours prior to the test. Your remaining medications may be taken with water.  2. Nothing to eat or drink,  except water, 3 hours prior to arrival time.   NO caffeine/decaffeinated products, or chocolate 12 hours prior to arrival.  3. NO perfume, cologne or lotion on chest or abdomen area.          - FEMALES - Please avoid wearing dresses to this appointment.  4. Total time is 1 to 2 hours; you may want to bring reading material for the waiting time.  5. Please report to Radiology at the [facility] Main Entrance 30 minutes early for your test.  [address] [place], [place] [address]  6. Please report to Radiology at [facility] Main Entrance, medical mall, 30 mins prior to your test.  [address]  [place], [place]  [phone]  Diabetic Preparation:  Hold oral medications. You may take NPH and Lantus insulin. Do not take Humalog  or Humulin R (Regular Insulin) the day of your test. Check blood sugars prior to leaving the house. If able to eat breakfast prior to 3 hour fasting, you may take all medications, including your insulin, Do not worry if you miss your breakfast dose of insulin - start at your next meal. Patients who wear a continuous glucose monitor MUST remove the device prior to scanning.  IF YOU THINK YOU MAY BE PREGNANT, OR ARE NURSING PLEASE INFORM THE TECHNOLOGIST.  In preparation for your appointment, medication and supplies will be purchased.  Appointment availability is limited, so if you need to cancel or reschedule, please call the Radiology Department at [phone] [facility]) OR [phone] [facility])  24 hours in advance to avoid a cancellation fee of $100.00  What to Expect After you Arrive:  Once you arrive and check in for your appointment, you will be taken to a preparation room within the Radiology Department.  A technologist or Nurse will obtain your medical history, verify that you are correctly prepped for the exam, and explain the procedure.  Afterwards,  an IV will be started in your arm and electrodes will be  placed on your skin for EKG monitoring during the stress portion of the exam. Then you will be escorted to the PET/CT scanner.  There, staff will get you positioned on the scanner and obtain a blood pressure and EKG.  During the exam, you will continue to be connected to the EKG and blood pressure machines.  A small, safe amount of a radioactive tracer will be injected in your IV to obtain a series of pictures of your heart along with an injection of a stress agent.    After your Exam:  It is recommended that you eat a meal and drink a caffeinated beverage to counter act any effects of the stress agent.  Drink plenty of fluids for the remainder of the day and urinate frequently for the first couple of hours after the exam.  Your doctor will inform you of your test results within 7-10 business days.  For more information and frequently asked questions, please visit our website : [email or web address]  For questions about your test or how to prepare for your test, please call: Cardiac Imaging Nurse Navigators Office: [phone]   Please note: We ask at that you not bring children with you during ultrasound (echo/ vascular) testing. Due to room size and safety concerns, children are not allowed in the ultrasound rooms during exams. Our front office staff cannot provide observation of children in our lobby area while testing is being conducted. An adult accompanying a patient to their appointment will only be allowed in the ultrasound room at the discretion of the ultrasound technician under special circumstances. We apologize for any inconvenience.    Follow-Up: At [facility], you and your health needs are our priority.  As part of our continuing mission to provide you with exceptional heart care, we have created designated Provider Care Teams.  These Care Teams include your primary Cardiologist (physician) and Advanced Practice Providers (APPs -  Physician Assistants and Nurse  Practitioners) who all work together to provide you with the care you need, when you need it.  We recommend signing up for the patient portal called "MyChart".  Sign up information is provided on this After Visit Summary.  MyChart is used to connect with patients for Virtual Visits (Telemedicine).  Patients are able to view lab/test results, encounter notes, upcoming appointments, etc.  Non-urgent messages can  be sent to your provider as well.   To learn more about what you can do with MyChart, go to [email or web address].    Your next appointment:   2 month(s)  Provider:   Nicki Guadalajara, MD        Signed, Nicki Guadalajara, MD  05/30/2023 1:55 PM    [facility] [address], [address], [place], [place]  [address] Phone: [phone]  05/30/2023 1:55 PM    [facility] [address], [place], [place]  [address] Phone: [phone]

## 2023-05-28 NOTE — Patient Instructions (Addendum)
Medication Instructions:  You have received cardiac clearance for your eye surgery.  You stop the Brilinta 60mg  for 5 prior to the eye surgery.   Discontinue tobacco use.    *If you need a refill on your cardiac medications before your next appointment, please call your pharmacy*   Lab Work: Return for fasting labs If you have labs (blood work) drawn today and your tests are completely normal, you will receive your results only by: MyChart Message (if you have MyChart) OR A paper copy in the mail If you have any lab test that is abnormal or we need to change your treatment, we will call you to review the results.   Testing/Procedures: Your physician has requested that you have an echocardiogram. Echocardiography is a painless test that uses sound waves to create images of your heart. It provides your doctor with information about the size and shape of your heart and how well your heart's chambers and valves are working. This procedure takes approximately one hour. There are no restrictions for this procedure. Please do NOT wear cologne, perfume, aftershave, or lotions (deodorant is allowed).   Please arrive 15 minutes prior to your appointment time.  How to Prepare for Your Cardiac PET/CT Stress Test:  1. Please do not take these medications before your test:   Medications that may interfere with the cardiac pharmacological stress agent (ex. nitrates - including erectile dysfunction medications, isosorbide mononitrate- [please start to hold this medication the day before the test], tamulosin or beta-blockers) the day of the exam. (Erectile dysfunction medication should be held for at least 72 hrs prior to test) Theophylline containing medications for 12 hours. Dipyridamole 48 hours prior to the test. Your remaining medications may be taken with water.  2. Nothing to eat or drink, except water, 3 hours prior to arrival time.   NO caffeine/decaffeinated products, or chocolate 12  hours prior to arrival.  3. NO perfume, cologne or lotion on chest or abdomen area.          - FEMALES - Please avoid wearing dresses to this appointment.  4. Total time is 1 to 2 hours; you may want to bring reading material for the waiting time.  5. Please report to Radiology at the [facility] Main Entrance 30 minutes early for your test.  [address] [place], [place] [address]  6. Please report to Radiology at [facility] Main Entrance, medical mall, 30 mins prior to your test.  [address]  [place], [place]  [phone]  Diabetic Preparation:  Hold oral medications. You may take NPH and Lantus insulin. Do not take Humalog or Humulin R (Regular Insulin) the day of your test. Check blood sugars prior to leaving the house. If able to eat breakfast prior to 3 hour fasting, you may take all medications, including your insulin, Do not worry if you miss your breakfast dose of insulin - start at your next meal. Patients who wear a continuous glucose monitor MUST remove the device prior to scanning.  IF YOU THINK YOU MAY BE PREGNANT, OR ARE NURSING PLEASE INFORM THE TECHNOLOGIST.  In preparation for your appointment, medication and supplies will be purchased.  Appointment availability is limited, so if you need to cancel or reschedule, please call the Radiology Department at [phone] [facility]) OR [phone] [facility])  24 hours in advance to avoid a cancellation fee of $100.00  What to Expect After you Arrive:  Once you arrive and check in  for your appointment, you will be taken to a preparation room within the Radiology Department.  A technologist or Nurse will obtain your medical history, verify that you are correctly prepped for the exam, and explain the procedure.  Afterwards,  an IV will be started in your arm and electrodes will be placed on your skin for EKG monitoring during the stress portion of the exam. Then you will be  escorted to the PET/CT scanner.  There, staff will get you positioned on the scanner and obtain a blood pressure and EKG.  During the exam, you will continue to be connected to the EKG and blood pressure machines.  A small, safe amount of a radioactive tracer will be injected in your IV to obtain a series of pictures of your heart along with an injection of a stress agent.    After your Exam:  It is recommended that you eat a meal and drink a caffeinated beverage to counter act any effects of the stress agent.  Drink plenty of fluids for the remainder of the day and urinate frequently for the first couple of hours after the exam.  Your doctor will inform you of your test results within 7-10 business days.  For more information and frequently asked questions, please visit our website : [email or web address]  For questions about your test or how to prepare for your test, please call: Cardiac Imaging Nurse Navigators Office: [phone]   Please note: We ask at that you not bring children with you during ultrasound (echo/ vascular) testing. Due to room size and safety concerns, children are not allowed in the ultrasound rooms during exams. Our front office staff cannot provide observation of children in our lobby area while testing is being conducted. An adult accompanying a patient to their appointment will only be allowed in the ultrasound room at the discretion of the ultrasound technician under special circumstances. We apologize for any inconvenience.    Follow-Up: At [facility], you and your health needs are our priority.  As part of our continuing mission to provide you with exceptional heart care, we have created designated Provider Care Teams.  These Care Teams include your primary Cardiologist (physician) and Advanced Practice Providers (APPs -  Physician Assistants and Nurse Practitioners) who all work together to provide you with the care you need, when you need  it.  We recommend signing up for the patient portal called "MyChart".  Sign up information is provided on this After Visit Summary.  MyChart is used to connect with patients for Virtual Visits (Telemedicine).  Patients are able to view lab/test results, encounter notes, upcoming appointments, etc.  Non-urgent messages can be sent to your provider as well.   To learn more about what you can do with MyChart, go to [email or web address].    Your next appointment:   2 month(s)  Provider:   Nicki Guadalajara, MD

## 2023-05-29 DIAGNOSIS — H43813 Vitreous degeneration, bilateral: Secondary | ICD-10-CM | POA: Diagnosis not present

## 2023-05-29 DIAGNOSIS — H25043 Posterior subcapsular polar age-related cataract, bilateral: Secondary | ICD-10-CM | POA: Diagnosis not present

## 2023-05-29 DIAGNOSIS — H18413 Arcus senilis, bilateral: Secondary | ICD-10-CM | POA: Diagnosis not present

## 2023-05-29 DIAGNOSIS — H2511 Age-related nuclear cataract, right eye: Secondary | ICD-10-CM | POA: Diagnosis not present

## 2023-05-29 DIAGNOSIS — H2512 Age-related nuclear cataract, left eye: Secondary | ICD-10-CM | POA: Diagnosis not present

## 2023-05-29 DIAGNOSIS — H2513 Age-related nuclear cataract, bilateral: Secondary | ICD-10-CM | POA: Diagnosis not present

## 2023-05-29 DIAGNOSIS — H25013 Cortical age-related cataract, bilateral: Secondary | ICD-10-CM | POA: Diagnosis not present

## 2023-05-30 ENCOUNTER — Encounter: Payer: Self-pay | Admitting: Cardiovascular Disease

## 2023-06-02 DIAGNOSIS — H43392 Other vitreous opacities, left eye: Secondary | ICD-10-CM | POA: Diagnosis not present

## 2023-06-02 DIAGNOSIS — H2512 Age-related nuclear cataract, left eye: Secondary | ICD-10-CM | POA: Diagnosis not present

## 2023-06-03 NOTE — Telephone Encounter (Signed)
Danielle Rankin, CMA reached out to surgery scheduler and patient to determine if clearance is still needed at this time as procedure was scheduled for 06/02/23.

## 2023-06-03 NOTE — Telephone Encounter (Signed)
Patient underwent procedure on 06/02/23, will remove request from Preop box.

## 2023-06-06 ENCOUNTER — Other Ambulatory Visit: Payer: Self-pay

## 2023-06-12 ENCOUNTER — Encounter (HOSPITAL_COMMUNITY): Payer: Self-pay | Admitting: Internal Medicine

## 2023-06-12 ENCOUNTER — Emergency Department (HOSPITAL_COMMUNITY): Payer: Medicare HMO

## 2023-06-12 ENCOUNTER — Other Ambulatory Visit: Payer: Self-pay

## 2023-06-12 ENCOUNTER — Inpatient Hospital Stay (HOSPITAL_COMMUNITY)
Admission: EM | Admit: 2023-06-12 | Discharge: 2023-06-20 | DRG: 189 | Disposition: A | Discharge status: Home Health | Payer: Medicare HMO | Attending: Internal Medicine | Admitting: Internal Medicine

## 2023-06-12 DIAGNOSIS — Z833 Family history of diabetes mellitus: Secondary | ICD-10-CM

## 2023-06-12 DIAGNOSIS — J9601 Acute respiratory failure with hypoxia: Secondary | ICD-10-CM | POA: Diagnosis present

## 2023-06-12 DIAGNOSIS — B348 Other viral infections of unspecified site: Secondary | ICD-10-CM

## 2023-06-12 DIAGNOSIS — Z91040 Latex allergy status: Secondary | ICD-10-CM

## 2023-06-12 DIAGNOSIS — Z79899 Other long term (current) drug therapy: Secondary | ICD-10-CM

## 2023-06-12 DIAGNOSIS — R918 Other nonspecific abnormal finding of lung field: Secondary | ICD-10-CM | POA: Diagnosis not present

## 2023-06-12 DIAGNOSIS — R4702 Dysphasia: Secondary | ICD-10-CM | POA: Diagnosis not present

## 2023-06-12 DIAGNOSIS — N189 Chronic kidney disease, unspecified: Secondary | ICD-10-CM | POA: Diagnosis present

## 2023-06-12 DIAGNOSIS — Z955 Presence of coronary angioplasty implant and graft: Secondary | ICD-10-CM | POA: Diagnosis not present

## 2023-06-12 DIAGNOSIS — Z66 Do not resuscitate: Secondary | ICD-10-CM | POA: Diagnosis present

## 2023-06-12 DIAGNOSIS — R0902 Hypoxemia: Secondary | ICD-10-CM | POA: Diagnosis not present

## 2023-06-12 DIAGNOSIS — Z9109 Other allergy status, other than to drugs and biological substances: Secondary | ICD-10-CM | POA: Diagnosis not present

## 2023-06-12 DIAGNOSIS — I2510 Atherosclerotic heart disease of native coronary artery without angina pectoris: Secondary | ICD-10-CM | POA: Diagnosis present

## 2023-06-12 DIAGNOSIS — J9811 Atelectasis: Secondary | ICD-10-CM | POA: Diagnosis not present

## 2023-06-12 DIAGNOSIS — Z7983 Long term (current) use of bisphosphonates: Secondary | ICD-10-CM

## 2023-06-12 DIAGNOSIS — R1314 Dysphagia, pharyngoesophageal phase: Secondary | ICD-10-CM | POA: Diagnosis present

## 2023-06-12 DIAGNOSIS — T380X5A Adverse effect of glucocorticoids and synthetic analogues, initial encounter: Secondary | ICD-10-CM | POA: Diagnosis present

## 2023-06-12 DIAGNOSIS — Z7902 Long term (current) use of antithrombotics/antiplatelets: Secondary | ICD-10-CM

## 2023-06-12 DIAGNOSIS — I5032 Chronic diastolic (congestive) heart failure: Secondary | ICD-10-CM | POA: Diagnosis present

## 2023-06-12 DIAGNOSIS — Z1152 Encounter for screening for COVID-19: Secondary | ICD-10-CM

## 2023-06-12 DIAGNOSIS — Z885 Allergy status to narcotic agent status: Secondary | ICD-10-CM

## 2023-06-12 DIAGNOSIS — R062 Wheezing: Secondary | ICD-10-CM | POA: Diagnosis not present

## 2023-06-12 DIAGNOSIS — F32A Depression, unspecified: Secondary | ICD-10-CM | POA: Diagnosis present

## 2023-06-12 DIAGNOSIS — Z7982 Long term (current) use of aspirin: Secondary | ICD-10-CM

## 2023-06-12 DIAGNOSIS — Z83719 Family history of colon polyps, unspecified: Secondary | ICD-10-CM

## 2023-06-12 DIAGNOSIS — D72829 Elevated white blood cell count, unspecified: Secondary | ICD-10-CM | POA: Diagnosis present

## 2023-06-12 DIAGNOSIS — Z808 Family history of malignant neoplasm of other organs or systems: Secondary | ICD-10-CM

## 2023-06-12 DIAGNOSIS — T17418A Gastric contents in trachea causing other injury, initial encounter: Secondary | ICD-10-CM | POA: Diagnosis present

## 2023-06-12 DIAGNOSIS — F17210 Nicotine dependence, cigarettes, uncomplicated: Secondary | ICD-10-CM | POA: Diagnosis present

## 2023-06-12 DIAGNOSIS — I130 Hypertensive heart and chronic kidney disease with heart failure and stage 1 through stage 4 chronic kidney disease, or unspecified chronic kidney disease: Secondary | ICD-10-CM | POA: Diagnosis present

## 2023-06-12 DIAGNOSIS — J441 Chronic obstructive pulmonary disease with (acute) exacerbation: Principal | ICD-10-CM | POA: Diagnosis present

## 2023-06-12 DIAGNOSIS — Z79890 Hormone replacement therapy (postmenopausal): Secondary | ICD-10-CM

## 2023-06-12 DIAGNOSIS — I252 Old myocardial infarction: Secondary | ICD-10-CM

## 2023-06-12 DIAGNOSIS — E785 Hyperlipidemia, unspecified: Secondary | ICD-10-CM | POA: Diagnosis present

## 2023-06-12 DIAGNOSIS — F419 Anxiety disorder, unspecified: Secondary | ICD-10-CM | POA: Diagnosis present

## 2023-06-12 DIAGNOSIS — Z803 Family history of malignant neoplasm of breast: Secondary | ICD-10-CM

## 2023-06-12 DIAGNOSIS — Z823 Family history of stroke: Secondary | ICD-10-CM

## 2023-06-12 DIAGNOSIS — E039 Hypothyroidism, unspecified: Secondary | ICD-10-CM | POA: Diagnosis present

## 2023-06-12 DIAGNOSIS — Z8249 Family history of ischemic heart disease and other diseases of the circulatory system: Secondary | ICD-10-CM

## 2023-06-12 DIAGNOSIS — Z7984 Long term (current) use of oral hypoglycemic drugs: Secondary | ICD-10-CM

## 2023-06-12 DIAGNOSIS — Z90710 Acquired absence of both cervix and uterus: Secondary | ICD-10-CM

## 2023-06-12 DIAGNOSIS — B9789 Other viral agents as the cause of diseases classified elsewhere: Secondary | ICD-10-CM | POA: Diagnosis present

## 2023-06-12 DIAGNOSIS — T17908A Unspecified foreign body in respiratory tract, part unspecified causing other injury, initial encounter: Secondary | ICD-10-CM | POA: Diagnosis not present

## 2023-06-12 DIAGNOSIS — Z789 Other specified health status: Secondary | ICD-10-CM

## 2023-06-12 DIAGNOSIS — Z860100 Personal history of colon polyps, unspecified: Secondary | ICD-10-CM

## 2023-06-12 DIAGNOSIS — J9602 Acute respiratory failure with hypercapnia: Secondary | ICD-10-CM | POA: Diagnosis present

## 2023-06-12 DIAGNOSIS — J449 Chronic obstructive pulmonary disease, unspecified: Secondary | ICD-10-CM | POA: Diagnosis not present

## 2023-06-12 DIAGNOSIS — Z7951 Long term (current) use of inhaled steroids: Secondary | ICD-10-CM

## 2023-06-12 DIAGNOSIS — E1122 Type 2 diabetes mellitus with diabetic chronic kidney disease: Secondary | ICD-10-CM | POA: Diagnosis present

## 2023-06-12 DIAGNOSIS — R0602 Shortness of breath: Secondary | ICD-10-CM | POA: Diagnosis not present

## 2023-06-12 LAB — CBC WITH DIFFERENTIAL/PLATELET
Abs Immature Granulocytes: 0.05 10*3/uL (ref 0.00–0.07)
Basophils Absolute: 0.1 10*3/uL (ref 0.0–0.1)
Basophils Relative: 0 %
Eosinophils Absolute: 0 10*3/uL (ref 0.0–0.5)
Eosinophils Relative: 0 %
HCT: 42.1 % (ref 36.0–46.0)
Hemoglobin: 13.7 g/dL (ref 12.0–15.0)
Immature Granulocytes: 0 %
Lymphocytes Relative: 16 %
Lymphs Abs: 2.1 10*3/uL (ref 0.7–4.0)
MCH: 32.5 pg (ref 26.0–34.0)
MCHC: 32.5 g/dL (ref 30.0–36.0)
MCV: 100 fL (ref 80.0–100.0)
Monocytes Absolute: 1.2 10*3/uL — ABNORMAL HIGH (ref 0.1–1.0)
Monocytes Relative: 9 %
Neutro Abs: 9.9 10*3/uL — ABNORMAL HIGH (ref 1.7–7.7)
Neutrophils Relative %: 75 %
Platelets: 354 10*3/uL (ref 150–400)
RBC: 4.21 MIL/uL (ref 3.87–5.11)
RDW: 15.1 % (ref 11.5–15.5)
WBC: 13.2 10*3/uL — ABNORMAL HIGH (ref 4.0–10.5)
nRBC: 0 % (ref 0.0–0.2)

## 2023-06-12 LAB — RESP PANEL BY RT-PCR (RSV, FLU A&B, COVID)  RVPGX2
Influenza A by PCR: NEGATIVE
Influenza B by PCR: NEGATIVE
Resp Syncytial Virus by PCR: NEGATIVE
SARS Coronavirus 2 by RT PCR: NEGATIVE

## 2023-06-12 LAB — BASIC METABOLIC PANEL
Anion gap: 12 (ref 5–15)
BUN: 13 mg/dL (ref 8–23)
CO2: 22 mmol/L (ref 22–32)
Calcium: 8.4 mg/dL — ABNORMAL LOW (ref 8.9–10.3)
Chloride: 100 mmol/L (ref 98–111)
Creatinine, Ser: 0.95 mg/dL (ref 0.44–1.00)
GFR, Estimated: >60 mL/min (ref >60)
Glucose, Bld: 136 mg/dL — ABNORMAL HIGH (ref 70–99)
Potassium: 4 mmol/L (ref 3.5–5.1)
Sodium: 134 mmol/L — ABNORMAL LOW (ref 135–145)

## 2023-06-12 LAB — BRAIN NATRIURETIC PEPTIDE: B Natriuretic Peptide: 106.4 pg/mL — ABNORMAL HIGH (ref 0.0–100.0)

## 2023-06-12 LAB — D-DIMER, QUANTITATIVE: D-Dimer, Quant: 0.4 ug{FEU}/mL (ref 0.00–0.50)

## 2023-06-12 MED ORDER — ATORVASTATIN CALCIUM 80 MG PO TABS
80.0000 mg | ORAL_TABLET | Freq: Every day | ORAL | Status: DC
  Administered 2023-06-12 – 2023-06-20 (×9): 80 mg via ORAL
  Filled 2023-06-12 (×9): qty 1

## 2023-06-12 MED ORDER — MONTELUKAST SODIUM 10 MG PO TABS
10.0000 mg | ORAL_TABLET | Freq: Every day | ORAL | Status: DC
  Administered 2023-06-12 – 2023-06-19 (×7): 10 mg via ORAL
  Filled 2023-06-12 (×7): qty 1

## 2023-06-12 MED ORDER — IPRATROPIUM-ALBUTEROL 0.5-2.5 (3) MG/3ML IN SOLN
3.0000 mL | RESPIRATORY_TRACT | Status: AC
  Administered 2023-06-12 (×3): 3 mL via RESPIRATORY_TRACT
  Filled 2023-06-12 (×3): qty 3

## 2023-06-12 MED ORDER — AZITHROMYCIN 250 MG PO TABS
500.0000 mg | ORAL_TABLET | Freq: Once | ORAL | Status: AC
  Administered 2023-06-12: 500 mg via ORAL
  Filled 2023-06-12: qty 2

## 2023-06-12 MED ORDER — AZITHROMYCIN 500 MG PO TABS
500.0000 mg | ORAL_TABLET | Freq: Every day | ORAL | Status: AC
  Administered 2023-06-13 – 2023-06-14 (×2): 500 mg via ORAL
  Filled 2023-06-12 (×2): qty 1

## 2023-06-12 MED ORDER — SODIUM CHLORIDE 0.9 % IV SOLN
1.0000 g | Freq: Once | INTRAVENOUS | Status: AC
  Administered 2023-06-12: 1 g via INTRAVENOUS
  Filled 2023-06-12: qty 10

## 2023-06-12 MED ORDER — TICAGRELOR 60 MG PO TABS
60.0000 mg | ORAL_TABLET | Freq: Two times a day (BID) | ORAL | Status: DC
  Administered 2023-06-12 – 2023-06-20 (×16): 60 mg via ORAL
  Filled 2023-06-12 (×20): qty 1

## 2023-06-12 MED ORDER — GUAIFENESIN ER 600 MG PO TB12
600.0000 mg | ORAL_TABLET | Freq: Two times a day (BID) | ORAL | Status: DC
  Administered 2023-06-12 – 2023-06-20 (×15): 600 mg via ORAL
  Filled 2023-06-12 (×15): qty 1

## 2023-06-12 MED ORDER — MIRTAZAPINE 15 MG PO TABS
45.0000 mg | ORAL_TABLET | Freq: Every day | ORAL | Status: DC
  Administered 2023-06-12 – 2023-06-19 (×7): 45 mg via ORAL
  Filled 2023-06-12: qty 1
  Filled 2023-06-12 (×4): qty 3
  Filled 2023-06-12: qty 1
  Filled 2023-06-12 (×3): qty 3

## 2023-06-12 MED ORDER — PREDNISONE 20 MG PO TABS
40.0000 mg | ORAL_TABLET | Freq: Every day | ORAL | Status: DC
  Administered 2023-06-13: 40 mg via ORAL
  Filled 2023-06-12: qty 4

## 2023-06-12 MED ORDER — FUROSEMIDE 20 MG PO TABS
20.0000 mg | ORAL_TABLET | Freq: Every day | ORAL | Status: DC
  Administered 2023-06-12 – 2023-06-20 (×9): 20 mg via ORAL
  Filled 2023-06-12 (×9): qty 1

## 2023-06-12 MED ORDER — RIVAROXABAN 10 MG PO TABS
10.0000 mg | ORAL_TABLET | Freq: Every day | ORAL | Status: DC
  Administered 2023-06-12 – 2023-06-13 (×2): 10 mg via ORAL
  Filled 2023-06-12 (×2): qty 1

## 2023-06-12 MED ORDER — DOXEPIN HCL 25 MG PO CAPS
75.0000 mg | ORAL_CAPSULE | Freq: Every day | ORAL | Status: DC
  Administered 2023-06-12 – 2023-06-19 (×7): 75 mg via ORAL
  Filled 2023-06-12: qty 3
  Filled 2023-06-12: qty 1
  Filled 2023-06-12 (×9): qty 3

## 2023-06-12 MED ORDER — ESCITALOPRAM OXALATE 20 MG PO TABS
20.0000 mg | ORAL_TABLET | Freq: Every day | ORAL | Status: DC
  Administered 2023-06-12 – 2023-06-20 (×9): 20 mg via ORAL
  Filled 2023-06-12: qty 1
  Filled 2023-06-12: qty 2
  Filled 2023-06-12: qty 1
  Filled 2023-06-12: qty 2
  Filled 2023-06-12 (×3): qty 1
  Filled 2023-06-12: qty 2
  Filled 2023-06-12: qty 1

## 2023-06-12 MED ORDER — CLONAZEPAM 0.5 MG PO TABS
0.5000 mg | ORAL_TABLET | Freq: Every day | ORAL | Status: DC
  Administered 2023-06-12: 0.5 mg via ORAL
  Filled 2023-06-12: qty 1

## 2023-06-12 MED ORDER — IPRATROPIUM-ALBUTEROL 0.5-2.5 (3) MG/3ML IN SOLN
3.0000 mL | RESPIRATORY_TRACT | Status: DC
  Administered 2023-06-12 – 2023-06-13 (×8): 3 mL via RESPIRATORY_TRACT
  Filled 2023-06-12 (×10): qty 3

## 2023-06-12 MED ORDER — MELATONIN 5 MG PO TABS
5.0000 mg | ORAL_TABLET | Freq: Every day | ORAL | Status: DC
  Administered 2023-06-13 – 2023-06-19 (×7): 5 mg via ORAL
  Filled 2023-06-12 (×7): qty 1

## 2023-06-12 MED ORDER — MOMETASONE FURO-FORMOTEROL FUM 100-5 MCG/ACT IN AERO
2.0000 | INHALATION_SPRAY | Freq: Two times a day (BID) | RESPIRATORY_TRACT | Status: DC
  Administered 2023-06-12 – 2023-06-13 (×2): 2 via RESPIRATORY_TRACT
  Filled 2023-06-12: qty 8.8

## 2023-06-12 MED ORDER — IPRATROPIUM-ALBUTEROL 0.5-2.5 (3) MG/3ML IN SOLN
3.0000 mL | Freq: Once | RESPIRATORY_TRACT | Status: AC
  Administered 2023-06-12: 3 mL via RESPIRATORY_TRACT
  Filled 2023-06-12: qty 3

## 2023-06-12 MED ORDER — NEBIVOLOL HCL 10 MG PO TABS
10.0000 mg | ORAL_TABLET | Freq: Every day | ORAL | Status: DC
  Administered 2023-06-12 – 2023-06-20 (×9): 10 mg via ORAL
  Filled 2023-06-12 (×9): qty 1

## 2023-06-12 MED ORDER — UMECLIDINIUM BROMIDE 62.5 MCG/ACT IN AEPB
1.0000 | INHALATION_SPRAY | Freq: Every day | RESPIRATORY_TRACT | Status: DC
  Administered 2023-06-12 – 2023-06-13 (×2): 1 via RESPIRATORY_TRACT
  Filled 2023-06-12: qty 7

## 2023-06-12 NOTE — H&P (Signed)
Date: 06/12/2023               Patient Name:  [name] [name] MRN: [id number]  DOB: 09/18/1955 Age / Sex: 67 y.o., female   PCP: [name], MD         Medical Service: Internal Medicine Teaching Service         Attending Physician: Dr. Inez Catalina, MD      First Contact: Dr. Laretta Bolster, MD Pager [phone]    Second Contact: Dr. Modena Slater, DO Pager [phone]         After Hours (After 5p/  First Contact Pager: [phone]  weekends / holidays): Second Contact Pager: [phone]   SUBJECTIVE   Chief Complaint: dyspnea  History of Present Illness:   Ms. [name] [name] is a 67 year old with past medical history of COPD, hypertension, prediabetes, CAD status post stents, who presents with 1 week of dyspnea and productive cough.  She was seen by her PCP on November 5 and was feeling symptomatic at that time.  Her PCP prescribed prednisone 40 mg for 5 days.  Initially her symptoms improved, however in the last week her breathing has worsened.  She denies myalgias, sinus congestion, fatigue, and sick contacts.  She reports adherence with her inhaler.  Has been using nebulizer with albuterol at home with some improvement in her symptoms.  ED Course: Hypoxic and placed on 2L [place]  Started on ceftriaxone, azithromycin and duoneb  Covid, flu, RSV negative  WBC 13.2, Ddimer 0.4, BNP 106  Past Medical History COPD CAD status post stents  Past Surgical History:  Procedure Laterality Date   ABDOMINAL EXPLORATION SURGERY  1977   ABDOMINAL HYSTERECTOMY  1977   "left one of my ovaries"   BACK SURGERY     BIOPSY  05/27/2019   Procedure: BIOPSY;  Surgeon: Corbin Ade, MD;  Location: [facility] END[facility] SU[facility];  Service: Endoscopy;;   BREAST BIOPSY Left 11/2021   x2   COLONOSCOPY  2011   Dr. Christella Hartigan: Mild diverticulosis, descending diminutive colon polyp (not retrieved), next colonoscopy 10 years   CORONARY ANGIOPLASTY WITH STENT PLACEMENT  2011 X2   "regular stents didn't work; had to go back in in  ~ 1 month and put in medicated stents"   DILATION AND CURETTAGE OF UTERUS     ESOPHAGOGASTRODUODENOSCOPY     ESOPHAGOGASTRODUODENOSCOPY (EGD) WITH PROPOFOL N/A 05/27/2019   normal esophagus, dilation, erosive gastropathy s/p biopsy, normal duodenum. Negative H.pylori.    LEFT HEART CATH AND CORONARY ANGIOGRAPHY N/A 03/26/2017   Procedure: LEFT HEART CATH AND CORONARY ANGIOGRAPHY;  Surgeon: Lyn Records, MD;  Location: [facility] INVASIVE CV LAB;  Service: Cardiovascular;  Laterality: N/A;   LUMBAR LAMINECTOMY/DECOMPRESSION MICRODISCECTOMY  08/05/2011   Procedure: LUMBAR LAMINECTOMY/DECOMPRESSION MICRODISCECTOMY;  Surgeon: Clydene Fake, MD;  Location: [facility] NEURO ORS;  Service: Neurosurgery;  Laterality: Right;  Right Lumbar four-five extraforaminal discectomy   MALONEY DILATION N/A 05/27/2019   Procedure: Elease Hashimoto DILATION;  Surgeon: Corbin Ade, MD;  Location: [facility] END[facility];  Service: Endoscopy;  Laterality: N/A;  54   SHOULDER ARTHROSCOPY WITH ROTATOR CUFF REPAIR AND OPEN BICEPS TENODESIS Right 12/24/2021   Procedure: RIGHT SHOULDER ARTHROSCOPY WITH ROTATOR CUFF REPAIR AND BICEPS TENODESIS;  Surgeon: Eldred Manges, MD;  Location: [facility] OR;  Service: Orthopedics;  Laterality: Right;   TONSILLECTOMY     as a child   UPPER GASTROINTESTINAL ENDOSCOPY      Meds:  Alendronate 35 Amlodipine 5 Atorvastatin 80 Buspar 15  mg qd Klonopin 0.5 mg at bedtime Doxepin 75 Lexapro 20 Synthryoid Metformin 500mg  qd Losartan 50 Remelteon Singular 10 Nebivolol 10 Prilosec 40 Trelegy  Brilinta 60 milligrams twice daily  Social:  Lives With: Alone, her daughter helps her with medication adherence and drives her to the grocery store. Occupation: Works as a Advertising copywriter at times Support: Daughter Level of Function: Dependent with cooking meals, Pharmacologist.  She no longer drives but her daughter is local and helps with this PCP: Dr. 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   OBJECTIVE:   Physical Exam: Blood pressure (!) 123/57, pulse 87, temperature 99.2 F (37.3 C), temperature source Oral, resp. rate 20, height 4\' 10"  (1.473 m), weight 67 kg, SpO2 90%.  Constitutional: well-appearing, in no acute distress HENT: normocephalic atraumatic, mucous membranes moist Eyes: conjunctiva non-erythematous Neck: supple Cardiovascular: regular rate and rhythm, no m/r/g Pulmonary/Chest: normal work of breathing on 2 L nasal cannula, diffuse wheezing throughout lung fields, no accessory muscle use Abdominal: soft, non-tender, non-distended MSK: Extremities warm and well-perfused Neurological: alert & oriented x 3 Skin: warm and dry  Labs: CBC    Component Value Date/Time   WBC 13.2 (H) 06/12/2023 0805   RBC 4.21 06/12/2023 0805   HGB 13.7 06/12/2023 0805   HGB 14.8 05/20/2023 1119   HCT 42.1 06/12/2023 0805   HCT 44.2 05/20/2023 1119   PLT 354 06/12/2023 0805   PLT 373 05/20/2023 1119   MCV 100.0 06/12/2023 0805   MCV 99 (H) 05/20/2023 1119   MCH 32.5 06/12/2023 0805   MCHC 32.5 06/12/2023 0805   RDW 15.1 06/12/2023 0805   RDW 13.2 05/20/2023 1119   LYMPHSABS 2.1 06/12/2023 0805   LYMPHSABS 2.6 04/05/2019 0926   MONOABS 1.2 (H) 06/12/2023 0805   EOSABS 0.0 06/12/2023 0805   EOSABS 0.3 04/05/2019 0926   BASOSABS 0.1 06/12/2023 0805   BASOSABS 0.1 04/05/2019 0926     CMP     Component Value Date/Time   NA 134 (L) 06/12/2023 0805   NA 139 03/04/2023 1543   K 4.0 06/12/2023 0805   CL 100 06/12/2023 0805   CO2 22 06/12/2023 0805   GLUCOSE 136 (H) 06/12/2023 0805   BUN 13  06/12/2023 0805   BUN 13 03/04/2023 1543   CREATININE 0.95 06/12/2023 0805   CREATININE 0.80 03/24/2023 1232   CREATININE 0.93 04/16/2016 0937   CALCIUM 8.4 (L) 06/12/2023 0805   PROT 7.2 03/24/2023 1232   PROT 7.2 09/14/2021 1058   ALBUMIN 4.4 03/24/2023 1232   ALBUMIN 4.8 09/14/2021 1058   AST 19 03/24/2023 1232   ALT 23 03/24/2023 1232   ALKPHOS 103 03/24/2023 1232   BILITOT 0.4 03/24/2023 1232   GFRNONAA >60 06/12/2023 0805   GFRNONAA >60 03/24/2023 1232   GFRNONAA 67 04/16/2016 0937   GFRAA 77 04/05/2019 0926   GFRAA 78 04/16/2016 0937    Imaging: Bronchial wall thickening, no interstitial edema, no consolidation  EKG: Sinus rhythm, low voltage  ASSESSMENT & PLAN:   Assessment & Plan by Problem: Principal Problem:   Acute exacerbation of chronic obstructive  pulmonary disease (COPD) (HCC)   CORNESHA HOVER is a 67 y.o. person living with a history of COPD, HFpEF, CAD status post stents, prediabetes who presented with shortness of breath and productive cough and admitted for acute hypoxic respiratory failure secondary to acute COPD exacerbation on hospital day 0  Acute COPD exacerbation Acute hypoxic respiratory failure Presenting with several days of progressive dyspnea and reductive cough.  She was treated by her PCP with steroids at the beginning of November.  She reports that initially her symptoms were better, however worsened last week.  She reports adherence with her inhalers, however on chart review pharmacy tech note indicates that she has been out of her inhaler since November 22.  Her daughter helps her with her medications.  Denies chills, sinus congestion, myalgias, fatigue and sick contacts.  Low suspicion for viral illness.  Chest x-ray did not show a consolidation.   -Prednisone 40 mg for 5 days total of steroids -Azithromycin 500 mg for 3 days -Restart Singulair 10 mg  Leukocytosis Mild leukocytosis at 13.2.  I do not appreciate findings concerning for  infection at this time.  She was given steroids by her PCP to treat COPD exacerbation at the beginning of November.  Patient is unsure when she completed steroids.  Will continue to monitor -Trend CBC  History of HFpEF Home medications include Lasix 20 mg.  She is unclear on her home medications.  Will history reflect that she has been taking this medication.  BNP was mildly elevated at 106.  On exam she appears euvolemic, extremities are warm and well-perfused.  Do not think that she is in an acute heart failure exacerbation at this time.  Frequent echo and November 2023 was within normal limits. -Start home Lasix 20 mg -Trend BMP -Daily weights -strict I's and O's  History of CAD status post stents Medications include Brilinta 60 mg twice daily.  She follows with cardiology.  Recent cath was in 2018 with restenosis of prior RCA and LAD stent. -Restart Brilinta 60 mg twice daily -Restart Lipitor 80 mg -Restart nebivolol 10 mg  Hypertension Medications include losartan 75 mg and amlodipine 5 mg.  Blood pressure is normotensive today and she did not take her medications this morning. -Holding home blood pressure meds  Pre-Diabetes Medications include metformin 500 mg daily -Restart discharge  Anxiety Home medications include Klonopin 0.5 mg nightly, doxepin 75 mg nightly, escitalopram 20 mg, and BuSpar 15 mg. -Restart home medications  Diet: Normal VTE: DOAC IVF: None,None Code: Full  Prior to Admission Living Arrangement: Home, living alone Anticipated Discharge Location: Home Barriers to Discharge: improve respiratory status  Dispo: Admit patient to Observation with expected length of stay less than 2 midnights.  Signed: Rudene Christians, DO Internal Medicine Resident PGY-3  06/12/2023, 12:22 PM

## 2023-06-12 NOTE — Progress Notes (Signed)
   06/12/23 1150  Spiritual Encounters  Type of Visit Initial  Care provided to: Patient  Reason for visit Advance directives  OnCall Visit Yes   Visited with patient, provided spiritual care, patient not interested in HCPOA.

## 2023-06-12 NOTE — Progress Notes (Signed)
Patient ambulated to bathroom and called RN on call light stating "I can't breathe". This RN went to patients room- O2 sats on RA in low 70s, patient sitting in bed dyspneic. Placed patient on 4L O2 and encouraged purse lipped breathing. After a few minutes patient less SOB, O2 sat 96% on 2L. Discussed w/ patient that we will be using BSC in future. Patient agrees. Notified dr.

## 2023-06-12 NOTE — ED Notes (Signed)
ED TO INPATIENT HANDOFF REPORT  ED Nurse Name and Phone #: Marcelino Duster [phone]  S Name/Age/Gender [name] [name] 67 y.o. female Room/Bed: 022C/022C  Code Status   Code Status: Full Code  Home/SNF/Other Home Patient oriented to: self, place, time, and situation Is this baseline? Yes   Triage Complete: Triage complete  Chief Complaint Acute exacerbation of chronic obstructive pulmonary disease (COPD) (HCC) [J44.1]  Triage Note Patient arrives by Ambulance at home with c/o shortness of breath.   Patient reports productive cough x 1 weeks.   Reports this morning was having difficulty breathing and used inhaler with no relief.   EMS reports oxygen saturations 75% on arrival and patient breath sounds tight and diminished with wheezing.     EMS gave patient total of 10 mg Albuterol, 1 mg Atrovent. 2 g Magnesium 125 mg Solu-medrol.    Allergies Allergies  Allergen Reactions   Avocado Anaphylaxis   Latex Shortness Of Breath and Rash   Codeine Nausea Only    Level of Care/Admitting Diagnosis ED Disposition     ED Disposition  Admit   Condition  --   Comment  Hospital Area: [facility] [[id number]]  Level of Care: Med-Surg [16]  May place patient in observation at [facility] or [facility] if equivalent level of care is available:: No  Covid Evaluation: Confirmed COVID Negative  Diagnosis: Acute exacerbation of chronic obstructive pulmonary disease (COPD) (HCC) [454098]  Admitting Physician: Inez Catalina [[id number]]  Attending Physician: Nena Polio          B Medical/Surgery History Past Medical History:  Diagnosis Date   Allergy    seasonal   Anemia    Anxiety    takes Lexapro daily   Arthritis    "back, from neck down pass my bra area" (03/25/2017)   Asthma    Bartholin gland cyst 08/29/2011   Bruises easily    pt is on Effient   Chronic back pain    herniated nucleus pulposus   Chronic back pain    "neck to bra area; lower back"  (03/25/2017)   Chronic kidney disease    recurrent UTI's this year 2022   COPD (chronic obstructive pulmonary disease) (HCC)    early stages   Coronary artery disease    Depression    takes Klonopin daily   Diabetes mellitus without complication (HCC)    Diverticulosis    Fibroadenoma of left breast    GERD (gastroesophageal reflux disease)    takes Nexium daily   H/O hiatal hernia    Heart attack (HCC) 2011   Hemorrhoids    Hernia    Hyperlipidemia    takes Lipitor daily   Hypertension    takes Losartan daily and Labetalol bid   Hypothyroidism    takes Synthroid daily   Insomnia    hydroxyzine prn   Joint pain    Pneumonia    "couple times" (03/25/2017)   Pre-diabetes    "just found out 1 wk ago" (03/25/2017)   Psoriasis    elbows,knees,back   Shortness of breath    with exertion   Slowing of urinary stream    Stress incontinence    Past Surgical History:  Procedure Laterality Date   ABDOMINAL EXPLORATION SURGERY  1977   ABDOMINAL HYSTERECTOMY  1977   "left one of my ovaries"   BACK SURGERY     BIOPSY  05/27/2019   Procedure: BIOPSY;  Surgeon: Corbin Ade, MD;  [facility];  Service: Endoscopy;;   BREAST BIOPSY Left 11/2021   x2   COLONOSCOPY  2011   Dr. Christella Hartigan: Mild diverticulosis, descending diminutive colon polyp (not retrieved), next colonoscopy 10 years   CORONARY ANGIOPLASTY WITH STENT PLACEMENT  2011 X2   "regular stents didn't work; had to go back in in ~ 1 month and put in medicated stents"   DILATION AND CURETTAGE OF UTERUS     ESOPHAGOGASTRODUODENOSCOPY     ESOPHAGOGASTRODUODENOSCOPY (EGD) WITH PROPOFOL N/A 05/27/2019   normal esophagus, dilation, erosive gastropathy s/p biopsy, normal duodenum. Negative H.pylori.    LEFT HEART CATH AND CORONARY ANGIOGRAPHY N/A 03/26/2017   Procedure: LEFT HEART CATH AND CORONARY ANGIOGRAPHY;  Surgeon: Lyn Records, MD;  Location: [facility] INVAS[facility] CV [facility];  Service: Cardiovascular;  Laterality: N/A;    LUMBAR LAMINECTOMY/DECOMPRESSION MICRODISCECTOMY  08/05/2011   Procedure: LUMBAR LAMINECTOMY/DECOMPRESSION MICRODISCECTOMY;  Surgeon: Clydene Fake, MD;  Location: [facility] NEURO OR[facility];  Service: Neurosurgery;  Laterality: Right;  Right Lumbar four-five extraforaminal discectomy   MALONEY DILATION N/A 05/27/2019   Procedure: Elease Hashimoto DILATION;  Surgeon: Corbin Ade, MD;  Location: [facility];  Service: Endoscopy;  Laterality: N/A;  54   SHOULDER ARTHROSCOPY WITH ROTATOR CUFF REPAIR AND OPEN BICEPS TENODESIS Right 12/24/2021   Procedure: RIGHT SHOULDER ARTHROSCOPY WITH ROTATOR CUFF REPAIR AND BICEPS TENODESIS;  Surgeon: Eldred Manges, MD;  Location: [facility] OR;  Service: Orthopedics;  Laterality: Right;   TONSILLECTOMY     as a child   UPPER GASTROINTESTINAL ENDOSCOPY       A IV Location/Drains/Wounds Patient Lines/Drains/Airways Status     Active Line/Drains/Airways     Name Placement date Placement time Site Days   Peripheral IV 06/12/23 20 G Right Forearm 06/12/23  --  Forearm  less than 1            Intake/Output Last 24 hours No intake or output data in the 24 hours ending 06/12/23 1016  Labs/Imaging Results for orders placed or performed during the hospital encounter of 06/12/23 (from the past 48 hour(s))  Basic metabolic panel     Status: Abnormal   Collection Time: 06/12/23  8:05 AM  Result Value Ref Range   Sodium 134 (L) 135 - 145 mmol/L   Potassium 4.0 3.5 - 5.1 mmol/L   Chloride 100 98 - 111 mmol/L   CO2 22 22 - 32 mmol/L   Glucose, Bld 136 (H) 70 - 99 mg/dL    Comment: Glucose reference range applies only to samples taken after fasting for at least 8 hours.   BUN 13 8 - 23 mg/dL   Creatinine, Ser 1.61 0.44 - 1.00 mg/dL   Calcium 8.4 (L) 8.9 - 10.3 mg/dL   GFR, Estimated >09 >60 mL/min    Comment: (NOTE) Calculated using the CKD-EPI Creatinine Equation (2021)    Anion gap 12 5 - 15    Comment: Performed at [facility], [address] [place], [place]  [address]  CBC with Differential     Status: Abnormal   Collection Time: 06/12/23  8:05 AM  Result Value Ref Range   WBC 13.2 (H) 4.0 - 10.5 K/uL   RBC 4.21 3.87 - 5.11 MIL/uL   Hemoglobin 13.7 12.0 - 15.0 g/dL   HCT 81.1 91.4 - 78.2 %   MCV 100.0 80.0 - 100.0 fL   MCH 32.5 26.0 - 34.0 pg   MCHC 32.5 30.0 - 36.0 g/dL   RDW 95.6 21.3 -  15.5 %   Platelets 354 150 - 400 K/uL   nRBC 0.0 0.0 - 0.2 %   Neutrophils Relative % 75 %   Neutro Abs 9.9 (H) 1.7 - 7.7 K/uL   Lymphocytes Relative 16 %   Lymphs Abs 2.1 0.7 - 4.0 K/uL   Monocytes Relative 9 %   Monocytes Absolute 1.2 (H) 0.1 - 1.0 K/uL   Eosinophils Relative 0 %   Eosinophils Absolute 0.0 0.0 - 0.5 K/uL   Basophils Relative 0 %   Basophils Absolute 0.1 0.0 - 0.1 K/uL   Immature Granulocytes 0 %   Abs Immature Granulocytes 0.05 0.00 - 0.07 K/uL    Comment: Performed at [facility], [address] [place], [place] [address]  Brain natriuretic peptide     Status: Abnormal   Collection Time: 06/12/23  8:05 AM  Result Value Ref Range   B Natriuretic Peptide 106.4 (H) 0.0 - 100.0 pg/mL    Comment: Performed at [facility], [address], [place], [place] [address]  D-dimer, quantitative     Status: None   Collection Time: 06/12/23  8:05 AM  Result Value Ref Range   D-Dimer, Quant 0.40 0.00 - 0.50 ug/mL-FEU    Comment: (NOTE) At the manufacturer cut-off value of 0.5 g/mL FEU, this assay has a negative predictive value of 95-100%.This assay is intended for use in conjunction with a clinical pretest probability (PTP) assessment model to exclude pulmonary embolism (PE) and deep venous thrombosis (DVT) in outpatients suspected of PE or DVT. Results should be correlated with clinical presentation. Performed at [facility], [address], [place], [place] [address]   Resp panel by RT-PCR (RSV, Flu A&B, Covid) Anterior Nasal Swab     Status: None   Collection Time: 06/12/23  8:05 AM   Specimen: Anterior Nasal  Swab  Result Value Ref Range   SARS Coronavirus 2 by RT PCR NEGATIVE NEGATIVE   Influenza A by PCR NEGATIVE NEGATIVE   Influenza B by PCR NEGATIVE NEGATIVE    Comment: (NOTE) The Xpert Xpress SARS-CoV-2/FLU/RSV plus assay is intended as an aid in the diagnosis of influenza from Nasopharyngeal swab specimens and should not be used as a sole basis for treatment. Nasal washings and aspirates are unacceptable for Xpert Xpress SARS-CoV-2/FLU/RSV testing.  Fact Sheet for Patients: BloggerCourse.com  Fact Sheet for Healthcare Providers: SeriousBroker.it  This test is not yet approved or cleared by the [place] FDA and has been authorized for detection and/or diagnosis of SARS-CoV-2 by FDA under an Emergency Use Authorization (EUA). This EUA will remain in effect (meaning this test can be used) for the duration of the COVID-19 declaration under Section 564(b)(1) of the Act, 21 U.S.C. section 360bbb-3(b)(1), unless the authorization is terminated or revoked.     Resp Syncytial Virus by PCR NEGATIVE NEGATIVE    Comment: (NOTE) Fact Sheet for Patients: BloggerCourse.com  Fact Sheet for Healthcare Providers: SeriousBroker.it  This test is not yet approved or cleared by the [place] FDA and has been authorized for detection and/or diagnosis of SARS-CoV-2 by FDA under an Emergency Use Authorization (EUA). This EUA will remain in effect (meaning this test can be used) for the duration of the COVID-19 declaration under Section 564(b)(1) of the Act, 21 U.S.C. section 360bbb-3(b)(1), unless the authorization is terminated or revoked.  Performed at [facility], [address], [place], [place] [address]    *Note: Due to a large number of results  and/or encounters for the requested time period, some results have not been displayed. A complete set of results can be found in  Results Review.   DG Chest 2 View  Result Date: 06/12/2023 CLINICAL DATA:  Short of breath.  History of COPD. EXAM: CHEST - 2 VIEW COMPARISON:  10/05/2021.  CT, 02/07/2023. FINDINGS: Cardiac silhouette is normal in size. Stable left coronary artery stent. No mediastinal or hilar masses. No convincing adenopathy. Lungs are mildly hyperexpanded. Lung base interstitial and bronchial wall thickening has increased compared to the prior chest radiograph, similar to the more recent prior chest CT. No lung consolidation. No pleural effusion or pneumothorax. Skeletal structures with no acute findings. IMPRESSION: 1. No acute cardiopulmonary disease. 2. Lung base interstitial and bronchial wall thickening consistent with bronchitis and/or bronchiolitis. Electronically Signed   By: Amie Portland M.D.   On: 06/12/2023 08:24    Pending Labs Unresulted Labs (From admission, onward)     Start     Ordered   06/13/23 0500  HIV Antibody (routine testing w rflx)  (HIV Antibody (Routine testing w reflex) panel)  Tomorrow morning,   R        06/12/23 1007   06/13/23 0500  Basic metabolic panel  Tomorrow morning,   R        06/12/23 1007   06/13/23 0500  CBC  Tomorrow morning,   R        06/12/23 1007            Vitals/Pain Today's Vitals   06/12/23 0800 06/12/23 0906 06/12/23 0915 06/12/23 1000  BP:  (!) 98/59 (!) 99/52 129/64  Pulse: 87 80 76 77  Resp: 20 (!) 23 (!) 21 18  Temp:      TempSrc:      SpO2: 90% 94% 94% 90%  Weight:      Height:      PainSc:        Isolation Precautions No active isolations  Medications Medications  rivaroxaban (XARELTO) tablet 10 mg (has no administration in time range)  predniSONE (DELTASONE) tablet 40 mg (has no administration in time range)  azithromycin (ZITHROMAX) tablet 500 mg (has no administration in time range)  ipratropium-albuterol (DUONEB) 0.5-2.5 (3) MG/3ML nebulizer solution 3 mL (3 mLs Nebulization Given 06/12/23 0852)  cefTRIAXone (ROCEPHIN) 1  g in sodium chloride 0.9 % 100 mL IVPB (0 g Intravenous Stopped 06/12/23 0903)  azithromycin (ZITHROMAX) tablet 500 mg (500 mg Oral Given 06/12/23 0828)    Mobility walks with device     Focused Assessments Pulmonary Assessment Handoff:  Lung sounds: Bilateral Breath Sounds: Diminished, Expiratory wheezes L Breath Sounds: Diminished R Breath Sounds: Diminished O2 Device: Nasal Cannula O2 Flow Rate (L/min): 2 L/min  Not on oxygen at baseline.    R Recommendations: See Admitting Provider Note  Report given to:   Additional Notes:

## 2023-06-12 NOTE — ED Triage Notes (Signed)
Patient arrives by Ambulance at home with c/o shortness of breath.   Patient reports productive cough x 1 weeks.   Reports this morning was having difficulty breathing and used inhaler with no relief.   EMS reports oxygen saturations 75% on arrival and patient breath sounds tight and diminished with wheezing.     EMS gave patient total of 10 mg Albuterol, 1 mg Atrovent. 2 g Magnesium 125 mg Solu-medrol.

## 2023-06-12 NOTE — ED Provider Notes (Signed)
[place] EMERGENCY DEPARTMENT AT [facility] Provider Note   CSN: [id number] Arrival date & time: 06/12/23  4098     History  No chief complaint on file.   [name] [name] is a 67 y.o. female.  67 year old female with a past medical history of HTN, HLD, CAD s/p multiple drug-eluting stents on Brilinta, T2DM, COPD, anxiety, hypothyroidism, tobacco use presents here for concerns of shortness of breath.  Patient's symptoms have been present for approximately the last 1 month.  She saw her PCP on 11/5, who started her on a 5-day burst of prednisone.  She states that her shortness of breath did not improve with steroids.  Her shortness of breath has been progressively worsening over the last several weeks.  She has had a cough that is worsened from her baseline cough associated with her COPD.  Her cough is now productive of phlegm, which is atypical for her.  She denies any fevers or chills.  No lower extremity edema.  No associated nausea or vomiting.  She denies any associated chest pain.  She has been needing to use her inhalers and nebulizers at home more frequently.  Although, these have not been helping in the last several days.  She denies any known sick contacts.  The history is provided by the patient and medical records.       Home Medications Prior to Admission medications   Medication Sig Start Date End Date Taking? Authorizing Provider  albuterol (VENTOLIN HFA) 108 (90 Base) MCG/ACT inhaler Inhale 2 puffs into the lungs every 6 (six) hours as needed for wheezing or shortness of breath. 01/17/23   [name], MD  alendronate (FOSAMAX) 35 MG tablet Take 1 tablet (35 mg total) by mouth every 7 (seven) days. Take with a full glass of water on an empty stomach. Sit upright for 30-60 mins after taking 02/27/23   [name], MD  amLODipine (NORVASC) 5 MG tablet Take 1 tablet (5 mg total) by mouth daily. 09/18/22   Joylene Grapes, NP  aspirin EC 81 MG tablet Take 81  mg by mouth daily.    [provider]  atorvastatin (LIPITOR) 80 MG tablet Take 1 tablet (80 mg total) by mouth daily, please schedule appointment for more refills 09/18/22   Joylene Grapes, NP  Blood Glucose Monitoring Suppl (TRUE METRIX METER) DEVI 1 kit by Does not apply route 4 (four) times daily. 04/04/17   Vivianne Master, PA-C  busPIRone (BUSPAR) 15 MG tablet Take 2 tablets (30 mg total) by mouth in the morning AND 1 tablet (15 mg total) daily at 12 noon AND 1 tablet (15 mg total) every evening. 04/25/23   Nwoko, Tommas Olp, PA  clonazePAM (KLONOPIN) 0.5 MG tablet Take 1 tablet (0.5 mg total) by mouth 2 (two) times daily as needed for anxiety. 05/13/23   Nwoko, Tommas Olp, PA  doxepin (SINEQUAN) 75 MG capsule Take 1 capsule (75 mg total) by mouth at bedtime. 04/25/23   Nwoko, Tommas Olp, PA  escitalopram (LEXAPRO) 20 MG tablet Take 1 tablet (20 mg total) by mouth daily. 04/25/23   Nwoko, Tommas Olp, PA  fluticasone (FLONASE) 50 MCG/ACT nasal spray Place 2 sprays into both nostrils daily. Patient not taking: Reported on 05/28/2023 04/26/22   Domenick Gong, MD  furosemide (LASIX) 20 MG tablet Take 1 tablet (20 mg total) by mouth daily. 09/18/22   Joylene Grapes, NP  gabapentin (NEURONTIN) 600 MG tablet TAKE 1 TABLET BY MOUTH  4 TIMES DAILY 12/20/22   [name], MD  glucose blood (TRUE METRIX BLOOD GLUCOSE TEST) test strip Use as instructed 01/21/18   [name], MD  ipratropium-albuterol (DUONEB) 0.5-2.5 (3) MG/3ML SOLN TAKE 3 MLS VIA NEBULIZATION EVERY 6 HOURS 11/18/19   Storm Frisk, MD  levothyroxine (SYNTHROID) 112 MCG tablet Take 1 tablet (112 mcg total) by mouth daily. 12/20/22   [name], MD  loperamide (IMODIUM A-D) 2 MG tablet Take 4 mg by mouth 4 (four) times daily as needed for diarrhea or loose stools.    [provider]  losartan (COZAAR) 50 MG tablet Take 1.5 tablets (75 mg total) by mouth daily. 09/18/22   Joylene Grapes, NP  metFORMIN  (GLUCOPHAGE) 500 MG tablet Take 1 tablet (500 mg total) by mouth 2 (two) times daily with a meal. 04/23/23   [name], MD  mirtazapine (REMERON) 45 MG tablet Take 1 tablet (45 mg total) by mouth at bedtime. 04/25/23   Nwoko, Tommas Olp, PA  montelukast (SINGULAIR) 10 MG tablet Take 1 tablet (10 mg total) by mouth at bedtime. 12/20/22   [name], MD  nebivolol (BYSTOLIC) 10 MG tablet Take 1 tablet (10 mg total) by mouth daily. 09/18/22   Joylene Grapes, NP  nitroGLYCERIN (NITROSTAT) 0.4 MG SL tablet Place 1 tablet (0.4 mg total) under the tongue every 5 (five) minutes as needed for chest pain. 09/18/22 12/17/22  Joylene Grapes, NP  omeprazole (PRILOSEC) 40 MG capsule Take 1 capsule (40 mg total) by mouth 2 (two) times daily before a meal. 02/19/23   Lemmon, Violet Baldy, PA  sodium chloride (OCEAN) 0.65 % SOLN nasal spray Place 1 spray into both nostrils as needed for congestion.    [provider]  Spacer/Aero-Holding Chambers (AEROCHAMBER MV) inhaler Use as instructed 04/26/22   Domenick Gong, MD  ticagrelor (BRILINTA) 60 MG TABS tablet Take 1 tablet (60 mg total) by mouth 2 (two) times daily. 09/18/22   Joylene Grapes, NP  TRELEGY ELLIPTA 100-62.5-25 MCG/ACT AEPB INHALE 1 PUFF into THE lungs DAILY 05/09/23   [name], MD  triamcinolone (KENALOG) 0.025 % ointment Apply 1 Application topically 2 (two) times daily as needed. 01/17/23   [name], MD  TRUEPLUS LANCETS 28G MISC 28 g by Does not apply route QID. 04/04/17   Vivianne Master, PA-C      Allergies    Avocado, Latex, and Codeine    Review of Systems   As noted in HPI  Physical Exam Updated Vital Signs There were no vitals taken for this visit. Physical Exam Vitals reviewed.  Constitutional:      General: She is not in acute distress.    Appearance: Normal appearance. She is not ill-appearing, toxic-appearing or diaphoretic.  HENT:     Mouth/Throat:     Mouth: Mucous membranes are moist.   Eyes:     Conjunctiva/sclera: Conjunctivae normal.  Cardiovascular:     Rate and Rhythm: Normal rate and regular rhythm.     Heart sounds: Heart sounds are distant.  Pulmonary:     Effort: Pulmonary effort is normal. No respiratory distress.     Breath sounds: Wheezing and rales present. No rhonchi.     Comments: Expiratory wheezing diffusely.  Rales noted in left lower lobe. Abdominal:     General: There is no distension.     Palpations: Abdomen is soft.     Tenderness: There is no abdominal tenderness.  There is no guarding or rebound.  Musculoskeletal:     Right lower leg: No edema.     Left lower leg: No edema.  Skin:    General: Skin is warm and dry.  Neurological:     Mental Status: She is alert.     ED Results / Procedures / Treatments   Labs (all labs ordered are listed, but only abnormal results are displayed) Labs Reviewed - No data to display  EKG None  Radiology No results found.  Procedures Procedures    Medications Ordered in ED Medications  rivaroxaban (XARELTO) tablet 10 mg (10 mg Oral Given 06/12/23 1024)  predniSONE (DELTASONE) tablet 40 mg (has no administration in time range)  azithromycin (ZITHROMAX) tablet 500 mg (has no administration in time range)  ipratropium-albuterol (DUONEB) 0.5-2.5 (3) MG/3ML nebulizer solution 3 mL (3 mLs Nebulization Given 06/12/23 1529)  atorvastatin (LIPITOR) tablet 80 mg (80 mg Oral Given 06/12/23 1331)  clonazePAM (KLONOPIN) tablet 0.5 mg (has no administration in time range)  doxepin (SINEQUAN) capsule 75 mg (has no administration in time range)  escitalopram (LEXAPRO) tablet 20 mg (20 mg Oral Given 06/12/23 1331)  mirtazapine (REMERON) tablet 45 mg (has no administration in time range)  montelukast (SINGULAIR) tablet 10 mg (has no administration in time range)  nebivolol (BYSTOLIC) tablet 10 mg (10 mg Oral Given 06/12/23 1331)  ticagrelor (BRILINTA) tablet 60 mg (60 mg Oral Given 06/12/23 1331)  furosemide  (LASIX) tablet 20 mg (20 mg Oral Given 06/12/23 1331)  mometasone-formoterol (DULERA) 100-5 MCG/ACT inhaler 2 puff (has no administration in time range)  umeclidinium bromide (INCRUSE ELLIPTA) 62.5 MCG/ACT 1 puff (has no administration in time range)  ipratropium-albuterol (DUONEB) 0.5-2.5 (3) MG/3ML nebulizer solution 3 mL (3 mLs Nebulization Given 06/12/23 0852)  cefTRIAXone (ROCEPHIN) 1 g in sodium chloride 0.9 % 100 mL IVPB (0 g Intravenous Stopped 06/12/23 0903)  azithromycin (ZITHROMAX) tablet 500 mg (500 mg Oral Given 06/12/23 1610)    ED Course/ Medical Decision Making/ A&P                                 Medical Decision Making Amount and/or Complexity of Data Reviewed Labs: ordered. Radiology: ordered and independent interpretation performed. ECG/medicine tests: ordered and independent interpretation performed.  Risk Prescription drug management. Decision regarding hospitalization.   67 year old female presents here for 1 month of progressively worsening shortness of breath now with worsened cough productive of sputum.  Patient arrives here via EMS.  She received albuterol and Atrovent as well as 125 mg Solu-Medrol prior to arrival.  On evaluation here, patient is hypoxic to 86% on room air.  Placed on 2 L nasal cannula. No increased work of breathing or respiratory distress.  She has diffuse expiratory wheezing with some rales noted in the left lower lobe.  Initial differential diagnosis includes COPD exacerbation, pneumonia, pneumothorax, PE, arrhythmia, hypervolemia, heart failure exacerbation.  Will obtain chest x-ray, EKG, screening labs.  Patient's presentation is consistent with a COPD exacerbation given the chronicity of her symptoms, worsened cough, new sputum production.  Will empirically treat with Rocephin and azithromycin.  Patient has already received steroids.  Will give additional nebulizer treatments with DuoNebs here for symptomatic treatment.  Anticipate admission  for this patient as she does have new hypoxia.  I independently reviewed the patient's chest x-ray.  No focal opacities consistent with pneumonia.  No pneumothorax.  I independently reviewed the patient's ECG.  Normal sinus rhythm.  Normal intervals.  No axis deviation.  T wave flattening in aVL, V1.  No ST segment changes.  When compared to prior ECG, no new findings.  Nonischemic ECG.  Independently reviewed the patient's labs, which are notable for CBC with leukocytosis with left shift.  BNP mildly elevated.  However, do not feel this is clinically significant given that her prior BNP was just a few points lower 2 years ago.  D-dimer negative.  Viral testing negative for COVID/flu/RSV.  Workup most consistent with COPD exacerbation.  D-dimer not reflective of PE.  Do not feel that CTA chest is indicated.  ECG nonischemic without evidence of arrhythmia.  No evidence of pneumonia or pneumothorax on imaging today.  Patient is felt to require hospital admission for her acute hypoxic respiratory failure.  She was discussed with the hospitalist team, who has accepted her for admission.  Patient's presentation is most consistent with acute presentation with potential threat to life or bodily function.         Final Clinical Impression(s) / ED Diagnoses Final diagnoses:  COPD exacerbation (HCC)  Acute hypoxic respiratory failure [facility])    Rx / DC Orders ED Discharge Orders     None         Rolla Flatten, MD 06/12/23 1733    Benjiman Core, MD 06/13/23 1446

## 2023-06-12 NOTE — Plan of Care (Signed)
Patient alert/oriented X4. Patient compliant with medication administration and weaned from 2L nasal cannula to room air with 93% oxygen saturation at rest. Patient skin assessed with Gilman Buttner, RN on admission, no skin issues noted. New IV placed in R forearm and duo nebs have helped with shortness of breath. VSS, will continue to monitor.

## 2023-06-13 ENCOUNTER — Observation Stay (HOSPITAL_COMMUNITY): Payer: Medicare HMO

## 2023-06-13 ENCOUNTER — Other Ambulatory Visit: Payer: Self-pay

## 2023-06-13 ENCOUNTER — Encounter (HOSPITAL_COMMUNITY): Payer: Self-pay | Admitting: Internal Medicine

## 2023-06-13 DIAGNOSIS — J441 Chronic obstructive pulmonary disease with (acute) exacerbation: Secondary | ICD-10-CM

## 2023-06-13 DIAGNOSIS — J9601 Acute respiratory failure with hypoxia: Principal | ICD-10-CM

## 2023-06-13 DIAGNOSIS — J9811 Atelectasis: Secondary | ICD-10-CM | POA: Diagnosis not present

## 2023-06-13 DIAGNOSIS — F17210 Nicotine dependence, cigarettes, uncomplicated: Secondary | ICD-10-CM

## 2023-06-13 DIAGNOSIS — T17908A Unspecified foreign body in respiratory tract, part unspecified causing other injury, initial encounter: Secondary | ICD-10-CM | POA: Diagnosis not present

## 2023-06-13 LAB — BASIC METABOLIC PANEL
Anion gap: 9 (ref 5–15)
BUN: 20 mg/dL (ref 8–23)
CO2: 25 mmol/L (ref 22–32)
Calcium: 9.2 mg/dL (ref 8.9–10.3)
Chloride: 105 mmol/L (ref 98–111)
Creatinine, Ser: 0.88 mg/dL (ref 0.44–1.00)
GFR, Estimated: >60 mL/min (ref >60)
Glucose, Bld: 150 mg/dL — ABNORMAL HIGH (ref 70–99)
Potassium: 4.8 mmol/L (ref 3.5–5.1)
Sodium: 139 mmol/L (ref 135–145)

## 2023-06-13 LAB — CBC
HCT: 42 % (ref 36.0–46.0)
Hemoglobin: 14.3 g/dL (ref 12.0–15.0)
MCH: 33.5 pg (ref 26.0–34.0)
MCHC: 34 g/dL (ref 30.0–36.0)
MCV: 98.4 fL (ref 80.0–100.0)
Platelets: 378 10*3/uL (ref 150–400)
RBC: 4.27 MIL/uL (ref 3.87–5.11)
RDW: 14.7 % (ref 11.5–15.5)
WBC: 13.8 10*3/uL — ABNORMAL HIGH (ref 4.0–10.5)
nRBC: 0 % (ref 0.0–0.2)

## 2023-06-13 LAB — HIV ANTIBODY (ROUTINE TESTING W REFLEX): HIV Screen 4th Generation wRfx: NONREACTIVE

## 2023-06-13 LAB — MRSA NEXT GEN BY PCR, NASAL: MRSA by PCR Next Gen: NOT DETECTED

## 2023-06-13 MED ORDER — METHYLPREDNISOLONE SODIUM SUCC 125 MG IJ SOLR
125.0000 mg | Freq: Once | INTRAMUSCULAR | Status: AC
  Administered 2023-06-13: 125 mg via INTRAVENOUS
  Filled 2023-06-13: qty 2

## 2023-06-13 MED ORDER — REVEFENACIN 175 MCG/3ML IN SOLN: 175.0000 ug | Freq: Every day | RESPIRATORY_TRACT | Status: DC

## 2023-06-13 MED ORDER — BUDESONIDE 0.5 MG/2ML IN SUSP
0.5000 mg | Freq: Two times a day (BID) | RESPIRATORY_TRACT | Status: DC
  Administered 2023-06-13 – 2023-06-20 (×14): 0.5 mg via RESPIRATORY_TRACT
  Filled 2023-06-13 (×14): qty 2

## 2023-06-13 MED ORDER — LEVOTHYROXINE SODIUM 112 MCG PO TABS
112.0000 ug | ORAL_TABLET | Freq: Every day | ORAL | Status: DC
  Administered 2023-06-13 – 2023-06-20 (×7): 112 ug via ORAL
  Filled 2023-06-13 (×8): qty 1

## 2023-06-13 MED ORDER — CHLORHEXIDINE GLUCONATE CLOTH 2 % EX PADS
6.0000 | MEDICATED_PAD | Freq: Every day | CUTANEOUS | Status: DC
  Administered 2023-06-13 – 2023-06-20 (×6): 6 via TOPICAL

## 2023-06-13 MED ORDER — MAGNESIUM SULFATE 2 GM/50ML IV SOLN
2.0000 g | Freq: Once | INTRAVENOUS | Status: AC
  Administered 2023-06-13: 2 g via INTRAVENOUS
  Filled 2023-06-13: qty 50

## 2023-06-13 MED ORDER — ALBUTEROL SULFATE (2.5 MG/3ML) 0.083% IN NEBU
INHALATION_SOLUTION | RESPIRATORY_TRACT | Status: AC
  Filled 2023-06-13: qty 3

## 2023-06-13 MED ORDER — BUSPIRONE HCL 5 MG PO TABS
30.0000 mg | ORAL_TABLET | Freq: Every morning | ORAL | Status: DC
  Administered 2023-06-13 – 2023-06-20 (×7): 30 mg via ORAL
  Filled 2023-06-13 (×6): qty 6
  Filled 2023-06-13: qty 3

## 2023-06-13 MED ORDER — ENOXAPARIN SODIUM 40 MG/0.4ML IJ SOSY
40.0000 mg | PREFILLED_SYRINGE | INTRAMUSCULAR | Status: DC
  Administered 2023-06-13 – 2023-06-19 (×7): 40 mg via SUBCUTANEOUS
  Filled 2023-06-13 (×7): qty 0.4

## 2023-06-13 MED ORDER — CLONAZEPAM 0.5 MG PO TBDP
0.5000 mg | ORAL_TABLET | Freq: Every day | ORAL | Status: DC
  Administered 2023-06-13 – 2023-06-20 (×8): 0.5 mg via ORAL
  Filled 2023-06-13 (×8): qty 1

## 2023-06-13 MED ORDER — ARFORMOTEROL TARTRATE 15 MCG/2ML IN NEBU
15.0000 ug | INHALATION_SOLUTION | Freq: Two times a day (BID) | RESPIRATORY_TRACT | Status: DC
  Administered 2023-06-13 – 2023-06-20 (×14): 15 ug via RESPIRATORY_TRACT
  Filled 2023-06-13 (×14): qty 2

## 2023-06-13 MED ORDER — PANTOPRAZOLE SODIUM 40 MG PO TBEC: 40.0000 mg | DELAYED_RELEASE_TABLET | Freq: Every day | ORAL | Status: DC

## 2023-06-13 MED ORDER — GABAPENTIN 300 MG PO CAPS
600.0000 mg | ORAL_CAPSULE | Freq: Three times a day (TID) | ORAL | Status: DC
  Administered 2023-06-13 – 2023-06-17 (×16): 600 mg via ORAL
  Filled 2023-06-13 (×16): qty 2

## 2023-06-13 MED ORDER — PANTOPRAZOLE SODIUM 40 MG IV SOLR
40.0000 mg | INTRAVENOUS | Status: DC
  Administered 2023-06-13 – 2023-06-14 (×2): 40 mg via INTRAVENOUS
  Filled 2023-06-13 (×2): qty 10

## 2023-06-13 MED ORDER — IPRATROPIUM-ALBUTEROL 0.5-2.5 (3) MG/3ML IN SOLN: 3.0000 mL | RESPIRATORY_TRACT | Status: DC

## 2023-06-13 MED ORDER — REVEFENACIN 175 MCG/3ML IN SOLN
175.0000 ug | Freq: Every day | RESPIRATORY_TRACT | Status: DC
  Administered 2023-06-14 – 2023-06-20 (×7): 175 ug via RESPIRATORY_TRACT
  Filled 2023-06-13 (×7): qty 3

## 2023-06-13 MED ORDER — LOSARTAN POTASSIUM 50 MG PO TABS
75.0000 mg | ORAL_TABLET | Freq: Every day | ORAL | Status: DC
  Administered 2023-06-13 – 2023-06-20 (×8): 75 mg via ORAL
  Filled 2023-06-13: qty 1
  Filled 2023-06-13: qty 2
  Filled 2023-06-13: qty 1
  Filled 2023-06-13: qty 2
  Filled 2023-06-13 (×4): qty 1

## 2023-06-13 MED ORDER — BUSPIRONE HCL 5 MG PO TABS
15.0000 mg | ORAL_TABLET | Freq: Every evening | ORAL | Status: DC
  Administered 2023-06-13 – 2023-06-19 (×7): 15 mg via ORAL
  Filled 2023-06-13 (×7): qty 3

## 2023-06-13 MED ORDER — LACTATED RINGERS IV BOLUS
500.0000 mL | Freq: Once | INTRAVENOUS | Status: AC
  Administered 2023-06-13: 500 mL via INTRAVENOUS

## 2023-06-13 MED ORDER — DEXMEDETOMIDINE HCL IN NACL 400 MCG/100ML IV SOLN
0.0000 ug/kg/h | INTRAVENOUS | Status: DC
  Administered 2023-06-13: 0.4 ug/kg/h via INTRAVENOUS
  Filled 2023-06-13: qty 100

## 2023-06-13 MED ORDER — BUSPIRONE HCL 5 MG PO TABS
15.0000 mg | ORAL_TABLET | Freq: Every day | ORAL | Status: DC
  Administered 2023-06-13 – 2023-06-20 (×8): 15 mg via ORAL
  Filled 2023-06-13 (×7): qty 3
  Filled 2023-06-13: qty 1

## 2023-06-13 MED ORDER — ASPIRIN 81 MG PO TBEC: 81.0000 mg | DELAYED_RELEASE_TABLET | Freq: Every day | ORAL | Status: DC

## 2023-06-13 MED ORDER — HYDRALAZINE HCL 20 MG/ML IJ SOLN: 10.0000 mg | INTRAMUSCULAR | Status: DC | PRN

## 2023-06-13 MED ORDER — ORAL CARE MOUTH RINSE
15.0000 mL | OROMUCOSAL | Status: DC
  Administered 2023-06-13 – 2023-06-20 (×22): 15 mL via OROMUCOSAL

## 2023-06-13 MED ORDER — AMLODIPINE BESYLATE 5 MG PO TABS
5.0000 mg | ORAL_TABLET | Freq: Every day | ORAL | Status: DC
  Administered 2023-06-13 – 2023-06-20 (×8): 5 mg via ORAL
  Filled 2023-06-13 (×8): qty 1

## 2023-06-13 MED ORDER — METHYLPREDNISOLONE SODIUM SUCC 125 MG IJ SOLR
60.0000 mg | Freq: Two times a day (BID) | INTRAMUSCULAR | Status: DC
  Administered 2023-06-13 – 2023-06-15 (×4): 60 mg via INTRAVENOUS
  Filled 2023-06-13 (×4): qty 2

## 2023-06-13 MED ORDER — IPRATROPIUM-ALBUTEROL 0.5-2.5 (3) MG/3ML IN SOLN
3.0000 mL | RESPIRATORY_TRACT | Status: DC | PRN
  Administered 2023-06-14 – 2023-06-15 (×3): 3 mL via RESPIRATORY_TRACT
  Filled 2023-06-13 (×3): qty 3

## 2023-06-13 MED ORDER — NICOTINE 14 MG/24HR TD PT24
14.0000 mg | MEDICATED_PATCH | Freq: Every day | TRANSDERMAL | Status: DC
  Administered 2023-06-14 – 2023-06-20 (×7): 14 mg via TRANSDERMAL
  Filled 2023-06-13 (×7): qty 1

## 2023-06-13 MED ORDER — SODIUM CHLORIDE 0.9 % IV SOLN
2.0000 g | INTRAVENOUS | Status: AC
  Administered 2023-06-13 – 2023-06-16 (×4): 2 g via INTRAVENOUS
  Filled 2023-06-13 (×5): qty 20

## 2023-06-13 MED ORDER — ALBUTEROL SULFATE (2.5 MG/3ML) 0.083% IN NEBU
2.5000 mg | INHALATION_SOLUTION | RESPIRATORY_TRACT | Status: DC | PRN
  Administered 2023-06-13 (×2): 2.5 mg via RESPIRATORY_TRACT
  Filled 2023-06-13 (×2): qty 3

## 2023-06-13 MED ORDER — ORAL CARE MOUTH RINSE: 15.0000 mL | OROMUCOSAL | Status: DC | PRN

## 2023-06-13 NOTE — Progress Notes (Signed)
Increased work of breathing. O2 78% on 6L. Nonrebreather applied. MD, respiratory, rapid response called. MD to bedside.

## 2023-06-13 NOTE — Plan of Care (Signed)
  Problem: Education: Goal: Knowledge of General Education information will improve Description Including pain rating scale, medication(s)/side effects and non-pharmacologic comfort measures Outcome: Progressing   Problem: Health Behavior/Discharge Planning: Goal: Ability to manage health-related needs will improve Outcome: Progressing   

## 2023-06-13 NOTE — Significant Event (Signed)
Rapid Response Event Note   Reason for Call :  Respiratory Distress  Initial Focused Assessment:  On arrival, pt sitting up in bed on NRB with audible wheezing and increased work of breathing noted. Currently receiving neb treatment.   Vitals: HR 79, BP 138/72, RR 26, spO2 96% on NRB.     Interventions:  Neb treatment CXR CCM consult  Plan of Care:  Will transfer to ICU for closer monitoring and Bipap.    Event Summary:   MD Notified: At bedside on arrival Call Time: 1750 Arrival Time: 1800 End Time: 1830  Mordecai Rasmussen, RN

## 2023-06-13 NOTE — Progress Notes (Signed)
Patient having increased work of breathing. MD notified. MD to bedside. New orders received.

## 2023-06-13 NOTE — Progress Notes (Signed)
HD#1 Subjective:  Overnight Events: No acute event overnight.  Seen at the bedside.  She has a dry nonproductive cough.  Denies worsening shortness of breath.  Objective:  Vital signs in last 24 hours: Vitals:   06/13/23 0028 06/13/23 0447 06/13/23 0902 06/13/23 0911  BP:  (!) 146/75  (!) 141/79  Pulse:  84 76 86  Resp:  18 18 20   Temp:  98 F (36.7 C)    TempSrc:  Oral    SpO2: 94% 95% 96% (!) 87%  Weight:  66.3 kg    Height:       Supplemental O2: Nasal Cannula SpO2: (!) 87 % O2 Flow Rate (L/min): 2 L/min   Physical Exam:   General: Not in acute distress. CV: RRR. No m/r/g. No LE edema Pulmonary: Diffuse wheezing bilaterally. Abdominal: Soft, nontender, nondistended. Normal bowel sounds.   Filed Weights   06/12/23 0746 06/13/23 0447  Weight: 67 kg 66.3 kg     Intake/Output Summary (Last 24 hours) at 06/13/2023 1027 Last data filed at 06/13/2023 0900 Gross per 24 hour  Intake 240 ml  Output --  Net 240 ml   Net IO Since Admission: 240 mL [06/13/23 1027]  No results for input(s): "GLUCAP" in the last 72 hours.   Pertinent Labs:    Latest Ref Rng & Units 06/13/2023    3:32 AM 06/12/2023    8:05 AM 05/20/2023   11:19 AM  CBC  WBC 4.0 - 10.5 K/uL 13.8  13.2  9.3   Hemoglobin 12.0 - 15.0 g/dL 40.9  81.1  91.4   Hematocrit 36.0 - 46.0 % 42.0  42.1  44.2   Platelets 150 - 400 K/uL 378  354  373        Latest Ref Rng & Units 06/13/2023    3:32 AM 06/12/2023    8:05 AM 03/24/2023   12:32 PM  CMP  Glucose 70 - 99 mg/dL 782  956  84   BUN 8 - 23 mg/dL 20  13  10    Creatinine 0.44 - 1.00 mg/dL 2.13  0.86  5.78   Sodium 135 - 145 mmol/L 139  134  138   Potassium 3.5 - 5.1 mmol/L 4.8  4.0  3.5   Chloride 98 - 111 mmol/L 105  100  103   CO2 22 - 32 mmol/L 25  22  28    Calcium 8.9 - 10.3 mg/dL 9.2  8.4  9.3   Total Protein 6.5 - 8.1 g/dL   7.2   Total Bilirubin 0.3 - 1.2 mg/dL   0.4   Alkaline Phos 38 - 126 U/L   103   AST 15 - 41 U/L   19   ALT 0  - 44 U/L   23     Imaging: No results found.  Assessment/Plan:   Principal Problem:   Acute exacerbation of chronic obstructive pulmonary disease (COPD) (HCC)   Patient Summary:  [name] [name] is a 67 y.o. person living with a history of COPD, HFpEF, CAD status post stents, prediabetes who presented with shortness of breath and productive cough and admitted for acute hypoxic respiratory failure secondary to acute COPD exacerbation.    Acute COPD exacerbation Acute hypoxic respiratory failure She reports mild improvement in shortness of breath, with a dry cough.  She is not on oxygen at home.  She is currently on 2 L [place], with normal SpO2.  Viral panel for COVID, flu, RSV all negative.  Mild  leukocytosis, wbc 13.8k, secondary to prednisone that she was taking. She is currently being treated with prednisone, and azithromycin.  Suspect COPD exacerbation is likely secondary to patient's persistent smoking.  -Ambulatory pulse ox. -Continue day  # 2/5 Prednisone 40 mg -Continue day #2/3 Azithromycin 500 mg  -Continue  Singulair 10 mg    History of HFpEF Euvolemic on exam.  Extremities warm and well-perfused.  -Continue home Lasix 20 mg. -Trend BMP -Daily weights -strict I's and O's   History of CAD status post stents Medications include Brilinta 60 mg twice daily.  She follows with cardiology.  Recent cath was in 2018 with restenosis of prior RCA and LAD stent.  She is not on aspirin. -continue  Brilinta 60 mg twice daily -continue Lipitor 80 mg -continue nebivolol 10 mg   Hypertension BP stable. Home regimen includes losartan 75 mg, and amlodipine 5 mg.  Will continue to hold home blood pressure medicines.  -Holding home blood pressure meds   Pre-Diabetes Medications include metformin 500 mg daily -Restart discharge   Anxiety Home medications include Klonopin 0.5 mg nightly, doxepin 75 mg nightly, escitalopram 20 mg, and BuSpar 15 mg. -Restart home medications   Diet:  Normal VTE: DOAC IVF: None,None Code: Full   Diet: Regular diet VTE: rivaroxaban (XARELTO) tablet 10 mg Start: 06/12/23 1015rivaroxaban (XARELTO) tablet 10 mg  Code: DNR/DNI PT/OT: Pending ID:  Anti-infectives (From admission, onward)    Start     Dose/Rate Route Frequency Ordered Stop   06/13/23 1000  azithromycin (ZITHROMAX) tablet 500 mg        500 mg Oral Daily 06/12/23 1008 06/15/23 0959   06/12/23 0815  cefTRIAXone (ROCEPHIN) 1 g in sodium chloride 0.9 % 100 mL IVPB        1 g 200 mL/hr over 30 Minutes Intravenous  Once 06/12/23 0805 06/12/23 0903   06/12/23 0815  azithromycin (ZITHROMAX) tablet 500 mg        500 mg Oral  Once 06/12/23 0805 06/12/23 0828        Anticipated discharge home, pending PT/OT recommendations.  Laretta Bolster, MD 06/13/2023, 10:27 AM Pager: [phone] [name]  Please contact the on call pager after 5 pm and on weekends at [phone].

## 2023-06-13 NOTE — Consult Note (Addendum)
NAME:  [name] [name], MRN:  [id number], DOB:  [birth year], LOS: 0 ADMISSION DATE:  06/12/2023, CONSULTATION DATE:  06/13/23 REFERRING MD:  Allena Katz [facility], CHIEF COMPLAINT:  SOB   History of Present Illness:  67 yo F PMH tobacco use (40+ years, currently 1 ppd) COPD on trelegy and albuterol, CAD who was admitted to IMTS 11/28 with SOB/ AECOPD. Notably had a recent AECOPD tx outpt by her PCP earlier this month. Started on duonebs, pred. Required a little [place]  11/29 progressive SOB over the course of the day, received mag and IV steroids w transient improvement, and towards end of shift was on NRB after an aspiration event with reported decr LOC.   PCCM consulted in this setting   Pertinent  Medical History  CAD Tobacco use COPD Significant Hospital Events: Including procedures, antibiotic start and stop dates in addition to other pertinent events   11/28 admit to IMTS azithro rocephin pred BD 11/29 worse over course of day. Mag, 1x solumedrol. Incr [place]. Aspiration event. NRB. PCCM consult   Interim History / Subjective:  Worsening all day    Apparently lethargic last time she was seen by primary. Now agitated/anxious  Objective   Blood pressure 137/74, pulse 84, temperature 97.7 F (36.5 C), temperature source Oral, resp. rate (!) 21, height 4\' 10"  (1.473 m), weight 66.3 kg, SpO2 99%.        Intake/Output Summary (Last 24 hours) at 06/13/2023 1828 Last data filed at 06/13/2023 1827 Gross per 24 hour  Intake 540 ml  Output --  Net 540 ml   Filed Weights   06/12/23 0746 06/13/23 0447  Weight: 67 kg 66.3 kg    Examination: General: chronically and acutely ill appearing older adult F in mild distress  HENT: NCAT NRB in place  Lungs: Diffuse wheezing. Cannot discern any crackles or rhonchi. + accessory muscle use   Cardiovascular: rr s1s2 cap refill < 3 sec  Abdomen: soft ndnt  Extremities: No edema, no acute joint deformity, no cyanosis  Neuro: AAOx4  GU: defer  Resolved  Hospital Problem list     Assessment & Plan:   Acute hypoxic respiratory failure AECOPD Tobacco use disorder Aspiration event -- cxr without obvious infiltrate, but had witnessed aspiration event  P -txf to ICU for BiPAP initiation -BID solumedrol 60mg  -escalating her BD to triple therapy which is more c/w her home reg -add back rocephin to azithro for her aspiration event  -check an RVP, sputum cx if she can produce  -monteleukast  -nicotine patch  -will dc abg, dont think it will clinically change tx -big picture, think she would benefit from outpt f/u w pulm. Sounds like she uses her rescue very regularly, and has poorly controlled sx frequently  -NPO  -consider SLP   CAD HFpEF HTN  P -home brilinta continued   Hypothyroidism -synthroid   Anxiety -home meds are ordered   Code status discussion -Full code confirmed w pt   Best Practice (right click and "Reselect all SmartList Selections" daily)   Diet/type: NPO DVT prophylaxis: rivaroxaban (XARELTO) tablet 10 mg Start: 06/12/23 1015 rivaroxaban (XARELTO) tablet 10 mg  -- stopping this, and starting lovenox  Pressure ulcer(s): not present on admission  GI prophylaxis: PPI Lines: N/A Foley:  N/A Code Status:  full code Last date of multidisciplinary goals of care discussion [11/29 ]  Labs   CBC: Recent Labs  Lab 06/12/23 0805 06/13/23 0332  WBC 13.2* 13.8*  NEUTROABS 9.9*  --  HGB 13.7 14.3  HCT 42.1 42.0  MCV 100.0 98.4  PLT 354 378    Basic Metabolic Panel: Recent Labs  Lab 06/12/23 0805 06/13/23 0332  NA 134* 139  K 4.0 4.8  CL 100 105  CO2 22 25  GLUCOSE 136* 150*  BUN 13 20  CREATININE 0.95 0.88  CALCIUM 8.4* 9.2   GFR: Estimated Creatinine Clearance: 50.7 mL/min (by C-G formula based on SCr of 0.88 mg/dL). Recent Labs  Lab 06/12/23 0805 06/13/23 0332  WBC 13.2* 13.8*    Liver Function Tests: No results for input(s): "AST", "ALT", "ALKPHOS", "BILITOT", "PROT", "ALBUMIN" in  the last 168 hours. No results for input(s): "LIPASE", "AMYLASE" in the last 168 hours. No results for input(s): "AMMONIA" in the last 168 hours.  ABG    Component Value Date/Time   PHART 7.365 09/15/2017 1744   PCO2ART 50.8 (H) 09/15/2017 1744   PO2ART 80.0 (L) 09/15/2017 1744   HCO3 29.0 (H) 09/15/2017 1744   TCO2 31 09/15/2017 1744   O2SAT 95.0 09/15/2017 1744     Coagulation Profile: No results for input(s): "INR", "PROTIME" in the last 168 hours.  Cardiac Enzymes: No results for input(s): "CKTOTAL", "CKMB", "CKMBINDEX", "TROPONINI" in the last 168 hours.  HbA1C: HbA1c, POC (prediabetic range)  Date/Time Value Ref Range Status  01/17/2023 10:28 AM 5.7 5.7 - 6.4 % Final  06/15/2020 10:27 AM 6.0 5.7 - 6.4 % Final   HbA1c, POC (controlled diabetic range)  Date/Time Value Ref Range Status  09/14/2021 09:43 AM 5.7 0.0 - 7.0 % Final   Hgb A1c MFr Bld  Date/Time Value Ref Range Status  12/18/2021 03:40 PM 5.3 4.8 - 5.6 % Final    Comment:    (NOTE) Pre diabetes:          5.7%-6.4%  Diabetes:              >6.4%  Glycemic control for   <7.0% adults with diabetes   10/05/2021 03:24 PM 5.8 (H) 4.8 - 5.6 % Final    Comment:    (NOTE) Pre diabetes:          5.7%-6.4%  Diabetes:              >6.4%  Glycemic control for   <7.0% adults with diabetes     CBG: No results for input(s): "GLUCAP" in the last 168 hours.  Review of Systems:   Review of Systems  Constitutional: Negative.   HENT: Negative.    Eyes: Negative.   Respiratory:  Positive for cough, shortness of breath and wheezing. Negative for hemoptysis and sputum production.   Cardiovascular: Negative.   Musculoskeletal: Negative.   Skin: Negative.   Neurological: Negative.   Endo/Heme/Allergies: Negative.   Psychiatric/Behavioral:  The patient is nervous/anxious.      Past Medical History:  She,  has a past medical history of Allergy, Anemia, Anxiety, Arthritis, Asthma, Bartholin gland cyst  (08/29/2011), Bruises easily, Chronic back pain, Chronic back pain, Chronic kidney disease, COPD (chronic obstructive pulmonary disease) (HCC), Coronary artery disease, Depression, Diabetes mellitus without complication (HCC), Diverticulosis, Fibroadenoma of left breast, GERD (gastroesophageal reflux disease), H/O hiatal hernia, Heart attack (HCC) (2011), Hemorrhoids, Hernia, Hyperlipidemia, Hypertension, Hypothyroidism, Insomnia, Joint pain, Pneumonia, Pre-diabetes, Psoriasis, Shortness of breath, Slowing of urinary stream, and Stress incontinence.   Surgical History:   Past Surgical History:  Procedure Laterality Date   ABDOMINAL EXPLORATION SURGERY  1977   ABDOMINAL HYSTERECTOMY  1977   "left one of my ovaries"  BACK SURGERY     BIOPSY  05/27/2019   Procedure: BIOPSY;  Surgeon: Corbin Ade, MD;  Location: [facility];  Service: Endoscopy;;   BREAST BIOPSY Left 11/2021   x2   COLONOSCOPY  2011   Dr. Christella Hartigan: Mild diverticulosis, descending diminutive colon polyp (not retrieved), next colonoscopy 10 years   CORONARY ANGIOPLASTY WITH STENT PLACEMENT  2011 X2   "regular stents didn't work; had to go back in in ~ 1 month and put in medicated stents"   DILATION AND CURETTAGE OF UTERUS     ESOPHAGOGASTRODUODENOSCOPY     ESOPHAGOGASTRODUODENOSCOPY (EGD) WITH PROPOFOL N/A 05/27/2019   normal esophagus, dilation, erosive gastropathy s/p biopsy, normal duodenum. Negative H.pylori.    LEFT HEART CATH AND CORONARY ANGIOGRAPHY N/A 03/26/2017   Procedure: LEFT HEART CATH AND CORONARY ANGIOGRAPHY;  Surgeon: Lyn Records, MD;  Location: [facility];  Service: Cardiovascular;  Laterality: N/A;   LUMBAR LAMINECTOMY/DECOMPRESSION MICRODISCECTOMY  08/05/2011   Procedure: LUMBAR LAMINECTOMY/DECOMPRESSION MICRODISCECTOMY;  Surgeon: Clydene Fake, MD;  Location: [facility]UR[facility];  Service: Neurosurgery;  Laterality: Right;  Right Lumbar four-five extraforaminal discectomy   MALONEY DILATION N/A  05/27/2019   Procedure: Elease Hashimoto DILATION;  Surgeon: Corbin Ade, MD;  Location: [facility];  Service: Endoscopy;  Laterality: N/A;  54   SHOULDER ARTHROSCOPY WITH ROTATOR CUFF REPAIR AND OPEN BICEPS TENODESIS Right 12/24/2021   Procedure: RIGHT SHOULDER ARTHROSCOPY WITH ROTATOR CUFF REPAIR AND BICEPS TENODESIS;  Surgeon: Eldred Manges, MD;  Location: [facility] OR;  Service: Orthopedics;  Laterality: Right;   TONSILLECTOMY     as a child   UPPER GASTROINTESTINAL ENDOSCOPY       Social History:   reports that she has been smoking cigarettes. She has a 25 pack-year smoking history. She has never used smokeless tobacco. She reports current alcohol use. She reports current drug use. Drug: Marijuana.   Family History:  Her family history includes Anesthesia problems in her daughter; Breast cancer in her maternal aunt; Cancer in her paternal grandmother; Colon polyps in her brother; Diabetes in her father; Heart disease in her father, maternal grandfather, and mother; Hypertension in her father and mother; Mental illness in her mother; Stroke in her mother; Throat cancer in her maternal uncle; Thyroid disease in her paternal aunt. There is no history of Hypotension, Malignant hyperthermia, Pseudochol deficiency, Colon cancer, Stomach cancer, Esophageal cancer, Pancreatic cancer, or Rectal cancer.   Allergies Allergies  Allergen Reactions   Avocado Anaphylaxis   Latex Shortness Of Breath and Rash   Codeine Nausea Only     Home Medications  Prior to Admission medications   Medication Sig Start Date End Date Taking? Authorizing Provider  albuterol (VENTOLIN HFA) 108 (90 Base) MCG/ACT inhaler Inhale 2 puffs into the lungs every 6 (six) hours as needed for wheezing or shortness of breath. 01/17/23  Yes [name], MD  alendronate (FOSAMAX) 35 MG tablet Take 1 tablet (35 mg total) by mouth every 7 (seven) days. Take with a full glass of water on an empty stomach. Sit upright for 30-60 mins after  taking Patient taking differently: Take 35 mg by mouth every Wednesday. 02/27/23  Yes [name], MD  amLODipine (NORVASC) 5 MG tablet Take 1 tablet (5 mg total) by mouth daily. 09/18/22  Yes Monge, Petra Kuba, NP  aspirin EC 81 MG tablet Take 81 mg by mouth daily.   Yes [provider]  atorvastatin (LIPITOR) 80 MG tablet Take 1  tablet (80 mg total) by mouth daily, please schedule appointment for more refills 09/18/22  Yes Monge, Petra Kuba, NP  busPIRone (BUSPAR) 15 MG tablet Take 2 tablets (30 mg total) by mouth in the morning AND 1 tablet (15 mg total) daily at 12 noon AND 1 tablet (15 mg total) every evening. 04/25/23  Yes Nwoko, Tommas Olp, PA  clonazePAM (KLONOPIN) 0.5 MG tablet Take 1 tablet (0.5 mg total) by mouth 2 (two) times daily as needed for anxiety. Patient taking differently: Take 0.5 mg by mouth See admin instructions. Take 1 tablet (0.5mg ) at bedtime. May taken an additional tablet during the day if needed for anxiety. 05/13/23  Yes Nwoko, Uchenna E, PA  doxepin (SINEQUAN) 75 MG capsule Take 1 capsule (75 mg total) by mouth at bedtime. 04/25/23  Yes Nwoko, Uchenna E, PA  escitalopram (LEXAPRO) 20 MG tablet Take 1 tablet (20 mg total) by mouth daily. 04/25/23  Yes Nwoko, Uchenna E, PA  furosemide (LASIX) 20 MG tablet Take 1 tablet (20 mg total) by mouth daily. 09/18/22  Yes Monge, Petra Kuba, NP  gabapentin (NEURONTIN) 600 MG tablet TAKE 1 TABLET BY MOUTH 4 TIMES DAILY 12/20/22  Yes [name], MD  ketorolac (ACULAR) 0.5 % ophthalmic solution Place 1 drop into the left eye 4 (four) times daily.   Yes [provider]  levothyroxine (SYNTHROID) 112 MCG tablet Take 1 tablet (112 mcg total) by mouth daily. 12/20/22  Yes [name], MD  losartan (COZAAR) 50 MG tablet Take 1.5 tablets (75 mg total) by mouth daily. 09/18/22  Yes Monge, Petra Kuba, NP  metFORMIN (GLUCOPHAGE) 500 MG tablet Take 1 tablet (500 mg total) by mouth 2 (two) times daily with a meal. Patient taking  differently: Take 500 mg by mouth daily with lunch. 04/23/23  Yes [name], MD  mirtazapine (REMERON) 45 MG tablet Take 1 tablet (45 mg total) by mouth at bedtime. 04/25/23  Yes Nwoko, Uchenna E, PA  montelukast (SINGULAIR) 10 MG tablet Take 1 tablet (10 mg total) by mouth at bedtime. 12/20/22  Yes [name], MD  nebivolol (BYSTOLIC) 10 MG tablet Take 1 tablet (10 mg total) by mouth daily. 09/18/22  Yes Monge, Petra Kuba, NP  nitroGLYCERIN (NITROSTAT) 0.4 MG SL tablet Place 1 tablet (0.4 mg total) under the tongue every 5 (five) minutes as needed for chest pain. 09/18/22 07/26/23 Yes Monge, Petra Kuba, NP  ofloxacin (OCUFLOX) 0.3 % ophthalmic solution Place 1 drop into the left eye 4 (four) times daily.   Yes [provider]  omeprazole (PRILOSEC) 40 MG capsule Take 1 capsule (40 mg total) by mouth 2 (two) times daily before a meal. 02/19/23  Yes Lemmon, Violet Baldy, PA  prednisoLONE acetate (PRED FORTE) 1 % ophthalmic suspension Place 1 drop into the left eye 4 (four) times daily.   Yes [provider]  ticagrelor (BRILINTA) 60 MG TABS tablet Take 1 tablet (60 mg total) by mouth 2 (two) times daily. 09/18/22  Yes Monge, Petra Kuba, NP  TRELEGY ELLIPTA 100-62.5-25 MCG/ACT AEPB INHALE 1 PUFF into THE lungs DAILY 05/09/23  Yes [name], MD  triamcinolone (KENALOG) 0.025 % ointment Apply 1 Application topically 2 (two) times daily as needed. 01/17/23  Yes [name], MD  Blood Glucose Monitoring Suppl (TRUE METRIX METER) DEVI 1 kit by Does not apply route 4 (four) times daily. 04/04/17   Vivianne Master, PA-C  glucose blood (TRUE METRIX BLOOD GLUCOSE TEST) test strip  Use as instructed 01/21/18   [name], MD  Spacer/Aero-Holding Chambers (AEROCHAMBER MV) inhaler Use as instructed 04/26/22   Domenick Gong, MD  TRUEPLUS LANCETS 28G MISC 28 g by Does not apply route QID. 04/04/17   Vivianne Master, PA-C     Critical care time: 38 min      CRITICAL  CARE Performed by: Lanier Clam   Total critical care time: 38 minutes  Critical care time was exclusive of separately billable procedures and treating other patients.  Critical care was necessary to treat or prevent imminent or life-threatening deterioration.  Critical care was time spent personally by me on the following activities: development of treatment plan with patient and/or surrogate as well as nursing, discussions with consultants, evaluation of patient's response to treatment, examination of patient, obtaining history from patient or surrogate, ordering and performing treatments and interventions, ordering and review of laboratory studies, ordering and review of radiographic studies, pulse oximetry and re-evaluation of patient's condition.  Tessie Fass MSN, AGACNP-BC [facility] Pulmonary/Critical Care Medicine Amion for pager  06/13/2023, 6:44 PM

## 2023-06-13 NOTE — Progress Notes (Signed)
Transition of Care [facility]) - Inpatient Brief Assessment   Patient Details  Name: [name] [name] MRN: [id number] Date of Birth: 09/03/1955  [facility] CM/SW Contact:    Durenda Guthrie, RN Phone Number: 06/13/2023, 12:22 PM   Clinical Narrative: Patient is a 67yr old female from home, adm with COPD exacerbation. On 3L oxygen via [place] at present. Respiratory and neb treatments as ordered.    Transition of Care Asessment: Insurance and Status: (P) Insurance coverage has been reviewed Patient has primary care physician: (P) Yes (Dr.Johnson- [facility]) Home environment has been reviewed: (P) from home Prior level of function:: (P) independent Prior/Current Home Services: (P) No current home services Social Determinants of Health Reivew: (P) SDOH reviewed no interventions necessary Readmission risk has been reviewed: (P) Yes Transition of care needs: (P) no transition of care needs at this time

## 2023-06-13 NOTE — Progress Notes (Signed)
Contacted by RN to evaluate patient at bedside.  Patient evaluated at bedside again.  Patient was having more labored breathing, lethargic, and having accessory muscle use.  Patient was not as responsive as she was earlier.  On exam, patient had decreased lung sounds bilaterally.  Diffuse wheezing as well.  Given concern that patient has been slowly declining over the day, did think it would be important to consult critical care medicine which I did.  Of note, patient also did have some food, in which patient may have aspirated and I ordered a chest x-ray.  Given concern for lethargy, and hypercapnia, ordered ABG.  CCM to consult.  I did make my attending aware as well.

## 2023-06-13 NOTE — Hospital Course (Addendum)
FARINA PROBST is a 67 y.o. person living with a history of COPD, HFpEF, CAD status post stents, prediabetes who presented with shortness of breath and productive cough and admitted for acute hypoxic respiratory failure secondary to acute COPD exacerbation.   Plan:   #Acute hypoxic respiratory failure #Acute COPD exacerbation #Positive Parainfluenzae  #Leukocytosis Presented with several days of progressive dyspnea and productive cough for 1 week, found to be hypoxic in ED, placed on 2L nasal cannula. Elevated WBC 13.2. BNP mildly elevated to 106.4. Respiratory panel negative for COVID, flu, RSV. Patient was transferred to ICU on 11/29-11/30 for progressive shortness of breath and increased oxygen requirements. Patient was placed on BiPAP overnight, received IV magnesium, IV Solu-Medrol 60mg , with addition of Rocephin to azithromycin. Extended Respiratory panel positive for parainfluenza virus 4.  Expectorant culture showed rare gram-positive cocci, with culture showing few normal respiratory flora, no Staph aureus or Pseudomonas.  Patient finished her IV Rocephin antibiotic course and Zithromax course.  Over the course of the inpatient, patient was given multiple breathing treatments that consisted of Brovana twice daily, Yuperlri daily, Proventil nebulization every 2 hours as needed for wheezing and shortness of breath, pulmonary code nebulization twice daily, DuoNebs every 4 hours, Mucinex 600 mg twice daily for congestion, Tessalon capsules for cough, Singulair 10 mg daily at bedtime.  PT OT has been working with her to help her wean her down from oxygen.  Patient is doing overall very well over the course of couple of days, she has been slowly been weaning off of oxygen, per evaluation today she was saturating on room air above 88%, with minimal drop in her O2 sats. Her breathing has improved with minimal wheezing that is apparent.    #HTN Continue home medications losartan 25 mg daily, amlodipine 5  mg daily. IV hydralazine 10 mg every 4 as needed if SBP > 160    Chronic conditions: CAD s/p stent ( 2018): Continue home medication Brilinta 60 mg twice daily, Lipitor 80 mg daily, nebivolol 10 mg  HFpEF: Continue Lasix 20 mg daily Hypothyroidism: Continue Synthroid 112 mcg daily Anxiety/Depression: Continue home medication Klonopin 0.5 mg nightly, doxepin 75 mg daily, Lexapro 20 mg daily, BuSpar 30 mg AM at 0800, 15 mg 1200, 15 mg 1800, mirtazapine 45 mg daily Prediabetes: Hold metformin 500 mg. -----------------------------------------------------------------------------------------------------  Mrs. Husman,   You came to the hospital for worsening SOB and were found to have parainfluenza virus with addition to your COPD flare up. We treated you with antibiotics and breathing treatments. We worked with you to get you down from the oxygen. It will take some time for you to get back to your usually self, I advise for you to take it slow and rest.   For your COPD: -Please continue your Trelegy Ellipta inhaler, 1 puff into the lungs daily -Singulair 10 mg take 1 tablet by mouth daily -Prednisone 20 mg, take 1 tablet by mouth for 5 days, Starting tomorrow.  -Continue breathing treatments through the nebulizer every 4-6 hours as needed for shortness of breath and wheezing -I am sending you with a couple of nebulizer solutions that she can take at home. -I am also sending you with Mucinex 600 mg tablets, you can take 1 tablet by mouth twice daily as needed to help with chest congestion congestion -I am also sending you with some Tessalon Perles, you can take 1 tablet as needed for cough -Please take it slow with activity, when you get short of breath, sit  down and take couple of deep breaths. -Please make sure that you eat plenty of food and keep yourself hydrated.   I am sending you home with 2 L of oxygen, please use it as you needed, please do not wear the oxygen all the time.  I advised to  use the oxygen in the next couple of weeks as needed, after couple of weeks please do not use the oxygen.  I do not wish for you to be dependent on oxygen if not needed.  Goal Oxygen 88-92%  For your blood pressure -Continue losartan 25 mg and amlodipine 5 mg daily  Continue the rest of your medications as prescribed.  No other changes were made.  If you have any of these following symptoms, please call us or seek care at an emergency department: -Chest Pain -Difficulty Breathing -Worsening abdominal pain -Syncope (passing out) -Drooping of face -Slurred speech -Sudden weakness in your leg or arm -Fever -Chills -blood in the stool -dark black, sticky stool  If you have any questions or concerns, call our clinic at [phone] or after hours call [phone] and ask for the internal medicine resident on call.  I am glad you are feeling better. It was a pleasure taking care for you. I wish a good recovery and good health!   Happy Holidays!   Dr. Jeral Pinch

## 2023-06-13 NOTE — Plan of Care (Signed)
Patient alert/oriented X4. Patient compliant with medication administration and post 30 min nebulizer treatment, patient complained of SOB. Vital signs taken, patient was on 75% on room air and placed on 4L nasal cannula stating at 95% oxygen saturation. Patient stated that she had multiple respiratory attacks such as this one overnight. MD notified for higher level of care due to needing enhanced monitoring/treatment. Patient transported to [facility] on 3L nasal cannula. Patient has personal belongings with her. Nursing report called to [facility].   Problem: Education: Goal: Knowledge of General Education information will improve Description: Including pain rating scale, medication(s)/side effects and non-pharmacologic comfort measures Outcome: Progressing   Problem: Health Behavior/Discharge Planning: Goal: Ability to manage health-related needs will improve Outcome: Progressing   Problem: Clinical Measurements: Goal: Ability to maintain clinical measurements within normal limits will improve Outcome: Progressing   Problem: Clinical Measurements: Goal: Will remain free from infection Outcome: Progressing   Problem: Clinical Measurements: Goal: Diagnostic test results will improve Outcome: Progressing   Problem: Clinical Measurements: Goal: Respiratory complications will improve Outcome: Progressing   Problem: Clinical Measurements: Goal: Respiratory complications will improve Outcome: Progressing   Problem: Clinical Measurements: Goal: Cardiovascular complication will be avoided Outcome: Progressing   Problem: Activity: Goal: Risk for activity intolerance will decrease Outcome: Progressing   Problem: Nutrition: Goal: Adequate nutrition will be maintained Outcome: Progressing   Problem: Coping: Goal: Level of anxiety will decrease Outcome: Progressing   Problem: Elimination: Goal: Will not experience complications related to bowel motility Outcome: Progressing   Problem:  Elimination: Goal: Will not experience complications related to urinary retention Outcome: Progressing   Problem: Pain Management: Goal: General experience of comfort will improve Outcome: Progressing   Problem: Safety: Goal: Ability to remain free from injury will improve Outcome: Progressing   Problem: Skin Integrity: Goal: Risk for impaired skin integrity will decrease Outcome: Progressing

## 2023-06-13 NOTE — Progress Notes (Signed)
Called to bedside as patient was having some shortness of breath.  Went to talk to patient and she was having some labored breath. Daughter at bedside. On exam patient had diffuse wheezing which did not improve after most recent dose of albuterol neb. Gave mag sulfate and one more dose of solumedrol.

## 2023-06-14 DIAGNOSIS — E1122 Type 2 diabetes mellitus with diabetic chronic kidney disease: Secondary | ICD-10-CM | POA: Diagnosis present

## 2023-06-14 DIAGNOSIS — N189 Chronic kidney disease, unspecified: Secondary | ICD-10-CM | POA: Diagnosis present

## 2023-06-14 DIAGNOSIS — J9601 Acute respiratory failure with hypoxia: Secondary | ICD-10-CM | POA: Diagnosis present

## 2023-06-14 DIAGNOSIS — F32A Depression, unspecified: Secondary | ICD-10-CM | POA: Diagnosis present

## 2023-06-14 DIAGNOSIS — Z91040 Latex allergy status: Secondary | ICD-10-CM | POA: Diagnosis not present

## 2023-06-14 DIAGNOSIS — E785 Hyperlipidemia, unspecified: Secondary | ICD-10-CM | POA: Diagnosis present

## 2023-06-14 DIAGNOSIS — T380X5A Adverse effect of glucocorticoids and synthetic analogues, initial encounter: Secondary | ICD-10-CM | POA: Diagnosis present

## 2023-06-14 DIAGNOSIS — Z9109 Other allergy status, other than to drugs and biological substances: Secondary | ICD-10-CM | POA: Diagnosis not present

## 2023-06-14 DIAGNOSIS — I130 Hypertensive heart and chronic kidney disease with heart failure and stage 1 through stage 4 chronic kidney disease, or unspecified chronic kidney disease: Secondary | ICD-10-CM | POA: Diagnosis present

## 2023-06-14 DIAGNOSIS — Z1152 Encounter for screening for COVID-19: Secondary | ICD-10-CM | POA: Diagnosis not present

## 2023-06-14 DIAGNOSIS — D72829 Elevated white blood cell count, unspecified: Secondary | ICD-10-CM | POA: Diagnosis present

## 2023-06-14 DIAGNOSIS — I5032 Chronic diastolic (congestive) heart failure: Secondary | ICD-10-CM | POA: Diagnosis present

## 2023-06-14 DIAGNOSIS — F17210 Nicotine dependence, cigarettes, uncomplicated: Secondary | ICD-10-CM | POA: Diagnosis present

## 2023-06-14 DIAGNOSIS — I2510 Atherosclerotic heart disease of native coronary artery without angina pectoris: Secondary | ICD-10-CM | POA: Diagnosis present

## 2023-06-14 DIAGNOSIS — J441 Chronic obstructive pulmonary disease with (acute) exacerbation: Secondary | ICD-10-CM | POA: Diagnosis present

## 2023-06-14 DIAGNOSIS — Z79899 Other long term (current) drug therapy: Secondary | ICD-10-CM | POA: Diagnosis not present

## 2023-06-14 DIAGNOSIS — T17418A Gastric contents in trachea causing other injury, initial encounter: Secondary | ICD-10-CM | POA: Diagnosis present

## 2023-06-14 DIAGNOSIS — Z79890 Hormone replacement therapy (postmenopausal): Secondary | ICD-10-CM | POA: Diagnosis not present

## 2023-06-14 DIAGNOSIS — E039 Hypothyroidism, unspecified: Secondary | ICD-10-CM | POA: Diagnosis present

## 2023-06-14 DIAGNOSIS — J9602 Acute respiratory failure with hypercapnia: Secondary | ICD-10-CM | POA: Diagnosis present

## 2023-06-14 DIAGNOSIS — Z789 Other specified health status: Secondary | ICD-10-CM

## 2023-06-14 DIAGNOSIS — Z66 Do not resuscitate: Secondary | ICD-10-CM | POA: Diagnosis present

## 2023-06-14 DIAGNOSIS — Z885 Allergy status to narcotic agent status: Secondary | ICD-10-CM | POA: Diagnosis not present

## 2023-06-14 DIAGNOSIS — B348 Other viral infections of unspecified site: Secondary | ICD-10-CM | POA: Diagnosis not present

## 2023-06-14 DIAGNOSIS — F419 Anxiety disorder, unspecified: Secondary | ICD-10-CM | POA: Diagnosis present

## 2023-06-14 DIAGNOSIS — B9789 Other viral agents as the cause of diseases classified elsewhere: Secondary | ICD-10-CM | POA: Diagnosis present

## 2023-06-14 LAB — RESPIRATORY PANEL BY PCR
Adenovirus: NOT DETECTED
Bordetella Parapertussis: NOT DETECTED
Bordetella pertussis: NOT DETECTED
Chlamydophila pneumoniae: NOT DETECTED
Coronavirus 229E: NOT DETECTED
Coronavirus HKU1: NOT DETECTED
Coronavirus NL63: NOT DETECTED
Coronavirus OC43: NOT DETECTED
Influenza A: NOT DETECTED
Influenza B: NOT DETECTED
Metapneumovirus: NOT DETECTED
Mycoplasma pneumoniae: NOT DETECTED
Parainfluenza Virus 1: NOT DETECTED
Parainfluenza Virus 2: NOT DETECTED
Parainfluenza Virus 3: NOT DETECTED
Parainfluenza Virus 4: DETECTED — AB
Respiratory Syncytial Virus: NOT DETECTED
Rhinovirus / Enterovirus: NOT DETECTED

## 2023-06-14 LAB — EXPECTORATED SPUTUM ASSESSMENT W GRAM STAIN, RFLX TO RESP C

## 2023-06-14 MED ORDER — ALBUTEROL SULFATE (2.5 MG/3ML) 0.083% IN NEBU
2.5000 mg | INHALATION_SOLUTION | RESPIRATORY_TRACT | Status: DC | PRN
  Administered 2023-06-15 – 2023-06-18 (×6): 2.5 mg via RESPIRATORY_TRACT
  Filled 2023-06-14 (×5): qty 3

## 2023-06-14 MED ORDER — CLONAZEPAM 0.5 MG PO TABS
0.5000 mg | ORAL_TABLET | Freq: Once | ORAL | Status: AC
  Administered 2023-06-14: 0.5 mg via ORAL
  Filled 2023-06-14: qty 1

## 2023-06-14 NOTE — Progress Notes (Addendum)
NAME:  [name] [name], MRN:  [id number], DOB:  April 03, 1956, LOS: 0 ADMISSION DATE:  06/12/2023, CONSULTATION DATE:  06/13/23 REFERRING MD:  Allena Katz [facility], CHIEF COMPLAINT:  SOB   History of Present Illness:  67 yo F PMH tobacco use (40+ years, currently 1 ppd) COPD on trelegy and albuterol, CAD who was admitted to IMTS 11/28 with SOB/ AECOPD. Notably had a recent AECOPD tx outpt by her PCP earlier this month. Started on duonebs, pred. Required a little [place]  11/29 progressive SOB over the course of the day, received mag and IV steroids w transient improvement, and towards end of shift was on NRB after an aspiration event with reported decr LOC.   PCCM consulted in this setting   Pertinent  Medical History  CAD Tobacco use COPD Significant Hospital Events: Including procedures, antibiotic start and stop dates in addition to other pertinent events   11/28 admit to I[facility] azithro rocephin pred BD 11/29 worse over course of day. Mag, 1x solumedrol. Incr [place]. Aspiration event. NRB. PCCM consult. ICU overnight for BiPAP  11/30 txf back to progressive   Interim History / Subjective:  Much better today! BiPAP really helped  Objective   Blood pressure 114/63, pulse (!) 55, temperature 97.9 F (36.6 C), temperature source Axillary, resp. rate 17, height 4\' 10"  (1.473 m), weight 66.3 kg, SpO2 100%.    Vent Mode: PSV;BIPAP FiO2 (%):  [40 %] 40 % PEEP:  [5 cmH20] 5 cmH20 Pressure Support:  [8 cmH20] 8 cmH20   Intake/Output Summary (Last 24 hours) at 06/14/2023 1004 Last data filed at 06/14/2023 0511 Gross per 24 hour  Intake 900.8 ml  Output 300 ml  Net 600.8 ml   Filed Weights   06/12/23 0746 06/13/23 0447  Weight: 67 kg 66.3 kg    Examination: General: Chronically and acutely ill F NAD  HENT: NCAT BiPAP in place  Lungs: Symmetrical chest expansion no accessory use on BiPAP. Improved wheeze  Cardiovascular: rrr s1s2  Abdomen: soft ndnt  Extremities: No edema, cyanosis or clubbing   Neuro: AAOx4  GU: deferred   Resolved Hospital Problem list     Assessment & Plan:   Acute hypoxic respiratory failure AECOPD Tobacco use disorder Aspiration event -- cxr without obvious infiltrate, but had witnessed aspiration event  P -stable to transfer out of ICU  -Cont BID solumedrol -- maybe start to wean tomorrow  -triple therapy + PRNs -monteleukast  -re-order RVP -- not sure why this was cancelled.  -f/u sputum cx  -cont rocephin azithro  -nicotine patch  -big picture, think she would benefit from outpt f/u w pulm. Sounds like she uses her rescue very regularly, and has poorly controlled sx frequently   Esophageal dysphagia -had a witnessed aspiration event 11/29 on solids which is not usual for pt. She does however endorse regular "choking" on water -in chart review, a few years ago was found to have esophageal dysphagia, could be that sx have worsened   P -NPO pending SLP   CAD HFpEF HTN  P -brilinta, lipitor -norvasc, losartan, sytolic, lasix   Hypothyroidism -synthroid   Anxiety -home meds are ordered   Code status discussion -Full code confirmed w pt 11/29   Dispo:  -out of ICU 11/30, will ask IMTS to take over -anticipate a few more days inpt for IV steroids etc -Pulm will follow in consultation   Best Practice (right click and "Reselect all SmartList Selections" daily)   Diet/type: NPO DVT prophylaxis: enoxaparin (LOVENOX) injection  40 mg Start: 06/13/23 2000 (NPO)  Pressure ulcer(s): not present on admission  GI prophylaxis: PPI Lines: N/A Foley:  N/A Code Status:  full code Last date of multidisciplinary goals of care discussion [11/29 ]  Labs   CBC: Recent Labs  Lab 06/12/23 0805 06/13/23 0332  WBC 13.2* 13.8*  NEUTROABS 9.9*  --   HGB 13.7 14.3  HCT 42.1 42.0  MCV 100.0 98.4  PLT 354 378    Basic Metabolic Panel: Recent Labs  Lab 06/12/23 0805 06/13/23 0332  NA 134* 139  K 4.0 4.8  CL 100 105  CO2 22 25  GLUCOSE  136* 150*  BUN 13 20  CREATININE 0.95 0.88  CALCIUM 8.4* 9.2   GFR: Estimated Creatinine Clearance: 50.7 mL/min (by C-G formula based on SCr of 0.88 mg/dL). Recent Labs  Lab 06/12/23 0805 06/13/23 0332  WBC 13.2* 13.8*    Liver Function Tests: No results for input(s): "AST", "ALT", "ALKPHOS", "BILITOT", "PROT", "ALBUMIN" in the last 168 hours. No results for input(s): "LIPASE", "AMYLASE" in the last 168 hours. No results for input(s): "AMMONIA" in the last 168 hours.  ABG    Component Value Date/Time   PHART 7.365 09/15/2017 1744   PCO2ART 50.8 (H) 09/15/2017 1744   PO2ART 80.0 (L) 09/15/2017 1744   HCO3 29.0 (H) 09/15/2017 1744   TCO2 31 09/15/2017 1744   O2SAT 95.0 09/15/2017 1744     Coagulation Profile: No results for input(s): "INR", "PROTIME" in the last 168 hours.  Cardiac Enzymes: No results for input(s): "CKTOTAL", "CKMB", "CKMBINDEX", "TROPONINI" in the last 168 hours.  HbA1C: HbA1c, POC (prediabetic range)  Date/Time Value Ref Range Status  01/17/2023 10:28 AM 5.7 5.7 - 6.4 % Final  06/15/2020 10:27 AM 6.0 5.7 - 6.4 % Final   HbA1c, POC (controlled diabetic range)  Date/Time Value Ref Range Status  09/14/2021 09:43 AM 5.7 0.0 - 7.0 % Final   Hgb A1c MFr Bld  Date/Time Value Ref Range Status  12/18/2021 03:40 PM 5.3 4.8 - 5.6 % Final    Comment:    (NOTE) Pre diabetes:          5.7%-6.4%  Diabetes:              >6.4%  Glycemic control for   <7.0% adults with diabetes   10/05/2021 03:24 PM 5.8 (H) 4.8 - 5.6 % Final    Comment:    (NOTE) Pre diabetes:          5.7%-6.4%  Diabetes:              >6.4%  Glycemic control for   <7.0% adults with diabetes     CBG: No results for input(s): "GLUCAP" in the last 168 hours.  CCT n/a    Tessie Fass MSN, AGACNP-BC [facility] Pulmonary/Critical Care Medicine Amion for pager  06/14/2023, 10:04 AM

## 2023-06-14 NOTE — Progress Notes (Signed)
eLink Physician-Brief Progress Note Patient Name: [name] [name] DOB: Dec 19, 1955 MRN: [id number]   Date of Service  06/14/2023  HPI/Events of Note    eICU Interventions       Pt placed on BPAP with anxiety. Has Clonaepam 0.5 PO daily receive early in am   Will give extra dose tonight of Clonazepam 0.5 mg      Massie Maroon 06/14/2023, 9:45 PM

## 2023-06-14 NOTE — Evaluation (Signed)
Clinical/Bedside Swallow Evaluation Patient Details  Name: [name] [name] MRN: [id number] Date of Birth: 07/28/1955  Today's Date: 06/14/2023 Time: SLP Start Time (ACUTE ONLY): 1030 SLP Stop Time (ACUTE ONLY): 1050 SLP Time Calculation (min) (ACUTE ONLY): 20 min  Past Medical History:  Past Medical History:  Diagnosis Date   Allergy    seasonal   Anemia    Anxiety    takes Lexapro daily   Arthritis    "back, from neck down pass my bra area" (03/25/2017)   Asthma    Bartholin gland cyst 08/29/2011   Bruises easily    pt is on Effient   Chronic back pain    herniated nucleus pulposus   Chronic back pain    "neck to bra area; lower back" (03/25/2017)   Chronic kidney disease    recurrent UTI's this year 2022   COPD (chronic obstructive pulmonary disease) (HCC)    early stages   Coronary artery disease    Depression    takes Klonopin daily   Diabetes mellitus without complication (HCC)    Diverticulosis    Fibroadenoma of left breast    GERD (gastroesophageal reflux disease)    takes Nexium daily   H/O hiatal hernia    Heart attack (HCC) 2011   Hemorrhoids    Hernia    Hyperlipidemia    takes Lipitor daily   Hypertension    takes Losartan daily and Labetalol bid   Hypothyroidism    takes Synthroid daily   Insomnia    hydroxyzine prn   Joint pain    Pneumonia    "couple times" (03/25/2017)   Pre-diabetes    "just found out 1 wk ago" (03/25/2017)   Psoriasis    elbows,knees,back   Shortness of breath    with exertion   Slowing of urinary stream    Stress incontinence    Past Surgical History:  Past Surgical History:  Procedure Laterality Date   ABDOMINAL EXPLORATION SURGERY  1977   ABDOMINAL HYSTERECTOMY  1977   "left one of my ovaries"   BACK SURGERY     BIOPSY  05/27/2019   Procedure: BIOPSY;  Surgeon: Corbin Ade, MD;  Location: [facility];  Service: Endoscopy;;   BREAST BIOPSY Left 11/2021   x2   COLONOSCOPY  2011   Dr. Christella Hartigan: Mild  diverticulosis, descending diminutive colon polyp (not retrieved), next colonoscopy 10 years   CORONARY ANGIOPLASTY WITH STENT PLACEMENT  2011 X2   "regular stents didn't work; had to go back in in ~ 1 month and put in medicated stents"   DILATION AND CURETTAGE OF UTERUS     ESOPHAGOGASTRODUODENOSCOPY     ESOPHAGOGASTRODUODENOSCOPY (EGD) WITH PROPOFOL N/A 05/27/2019   normal esophagus, dilation, erosive gastropathy s/p biopsy, normal duodenum. Negative H.pylori.    LEFT HEART CATH AND CORONARY ANGIOGRAPHY N/A 03/26/2017   Procedure: LEFT HEART CATH AND CORONARY ANGIOGRAPHY;  Surgeon: Lyn Records, MD;  Location: [facility];  Service: Cardiovascular;  Laterality: N/A;   LUMBAR LAMINECTOMY/DECOMPRESSION MICRODISCECTOMY  08/05/2011   Procedure: LUMBAR LAMINECTOMY/DECOMPRESSION MICRODISCECTOMY;  Surgeon: Clydene Fake, MD;  Location: [facility]UR[facility];  Service: Neurosurgery;  Laterality: Right;  Right Lumbar four-five extraforaminal discectomy   MALONEY DILATION N/A 05/27/2019   Procedure: Elease Hashimoto DILATION;  Surgeon: Corbin Ade, MD;  Location: [facility];  Service: Endoscopy;  Laterality: N/A;  54   SHOULDER ARTHROSCOPY WITH ROTATOR CUFF REPAIR AND OPEN BICEPS TENODESIS Right 12/24/2021   Procedure: RIGHT  SHOULDER ARTHROSCOPY WITH ROTATOR CUFF REPAIR AND BICEPS TENODESIS;  Surgeon: Eldred Manges, MD;  Location: [facility] OR;  Service: Orthopedics;  Laterality: Right;   TONSILLECTOMY     as a child   UPPER GASTROINTESTINAL ENDOSCOPY     HPI:  Patient is a 67 y.o. female with PMH: COPD, current tobacco use, CAD. She presented to the hospital on 06/12/23 with SOB/acute exacerbation of COPD. She recently was treated for this by her PCP eralier this month. On 11/29 she worsened over the course of the day and had an aspiration event resulting in PCCM consult and overnight need for BiPAP. She was transitioned off BiPAP by morning of 11/30. She was made NPO and SLP swallow evaluation ordered. She was  evaluated by SLP for swallowing function in 2021 at [facility] and given recommendations to follow reflux precautions, take her time eating/drinking, take small bites and sips.    Assessment / Plan / Recommendation  Clinical Impression  Paient presents with clinical s/s of a mild pharyngeal phase dysphagia as per this bedside swallow evaluation. She told SLP that prior to this hospitalization, she had difficulty with water and liquids thin like water and it would make her cough. She denied any difficulties with swallowing thicker liquids, solids or medications. SLP assessed her swallow function while she sat in recliner chair with the following PO consistencies: thin liquids, nectar thick liquids, mechanical soft solids. She exhibited immediate and delayed coughing with thin liquids but no overt s/s aspiration with nectar thick liquids or soft solids. Patient commented that the nectar thick juice felt better as compared to the water, when she was swallowing it. Cola led to excessive belching and still some delayed throat clearing and cough. SLP recommending initiate PO diet of Dys 3 (mechanical soft) solids and nectar thick liquids. SLP will follow patient for toleration, determine readiness for advancing and likely instrumental swallow evaluation. (MBS). SLP Visit Diagnosis: Dysphagia, pharyngeal phase (R13.13)    Aspiration Risk  Mild aspiration risk    Diet Recommendation Dysphagia 3 (Mech soft);Nectar-thick liquid    Liquid Administration via: Cup;Straw Medication Administration: Whole meds with puree Supervision: Patient able to self feed Compensations: Slow rate;Small sips/bites Postural Changes: Remain upright for at least 30 minutes after po intake;Seated upright at 90 degrees    Other  Recommendations Oral Care Recommendations: Oral care BID    Recommendations for follow up therapy are one component of a multi-disciplinary discharge planning process, led by the  attending physician.  Recommendations may be updated based on patient status, additional functional criteria and insurance authorization.  Follow up Recommendations Other (comment) (TBD)      Assistance Recommended at Discharge    Functional Status Assessment Patient has had a recent decline in their functional status and demonstrates the ability to make significant improvements in function in a reasonable and predictable amount of time.  Frequency and Duration min 1 x/week  1 week       Prognosis Prognosis for improved oropharyngeal function: Good      Swallow Study   General Date of Onset: 06/13/23 HPI: Patient is a 67 y.o. female with PMH: COPD, current tobacco use, CAD. She presented to the hospital on 06/12/23 with SOB/acute exacerbation of COPD. She recently was treated for this by her PCP eralier this month. On 11/29 she worsened over the course of the day and had an aspiration event resulting in PCCM consult and overnight need for BiPAP. She was transitioned off BiPAP by  morning of 11/30. She was made NPO and SLP swallow evaluation ordered. She was evaluated by SLP for swallowing function in 2021 at [facility] and given recommendations to follow reflux precautions, take her time eating/drinking, take small bites and sips. Type of Study: Bedside Swallow Evaluation Previous Swallow Assessment: 2021 BSE Diet Prior to this Study: NPO Temperature Spikes Noted: No Respiratory Status: Nasal cannula History of Recent Intubation: No Behavior/Cognition: Alert;Pleasant mood;Cooperative Oral Cavity Assessment: Within Functional Limits Oral Care Completed by SLP: No Oral Cavity - Dentition: Edentulous Vision: Functional for self-feeding Self-Feeding Abilities: Able to feed self Patient Positioning: Upright in bed Baseline Vocal Quality: Normal Volitional Cough: Strong Volitional Swallow: Able to elicit    Oral/Motor/Sensory Function Overall Oral Motor/Sensory  Function: Within functional limits   Ice Chips Ice chips: Not tested   Thin Liquid Thin Liquid: Impaired Presentation: Cup;Self Fed Pharyngeal  Phase Impairments: Throat Clearing - Immediate;Cough - Delayed;Cough - Immediate    Nectar Thick Nectar Thick Liquid: Within functional limits Presentation: Cup;Self Fed   Honey Thick     Puree Puree: Not tested   Solid     Solid: Within functional limits Presentation: Self Fed     Angela Nevin, MA, CCC-SLP Speech Therapy

## 2023-06-14 NOTE — Progress Notes (Signed)
   06/14/23 1951  BiPAP/CPAP/SIPAP  $ Non-Invasive Ventilator  Non-Invasive Vent Subsequent  BiPAP/CPAP/SIPAP Pt Type Adult  BiPAP/CPAP/SIPAP SERVO  Mask Type Full face mask  Mask Size Medium  Set Rate 12 breaths/min  Respiratory Rate 21 breaths/min  IPAP 13 cmH20  EPAP 8 cmH2O  FiO2 (%) 40 %  Minute Ventilation 9.5  Leak 14  Peak Inspiratory Pressure (PIP) 12  Tidal Volume (Vt) 809  Patient Home Equipment No  Press High Alarm 25 cmH2O  CPAP/SIPAP surface wiped down Yes  BiPAP/CPAP /SiPAP Vitals  BP (!) 151/84  Bilateral Breath Sounds Expiratory wheezes;Diminished  MEWS Score/Color  MEWS Score 0  MEWS Score Color [name] [name]

## 2023-06-14 NOTE — Progress Notes (Signed)
Patient got claustrophobic, requested to be taken off BIPAP, patient switched to Page 5L , vitals stable and patient looks more comfortable.

## 2023-06-15 ENCOUNTER — Telehealth: Payer: Self-pay | Admitting: Pulmonary Disease

## 2023-06-15 DIAGNOSIS — J9601 Acute respiratory failure with hypoxia: Secondary | ICD-10-CM | POA: Diagnosis not present

## 2023-06-15 DIAGNOSIS — J441 Chronic obstructive pulmonary disease with (acute) exacerbation: Secondary | ICD-10-CM | POA: Diagnosis not present

## 2023-06-15 LAB — GLUCOSE, CAPILLARY: Glucose-Capillary: 183 mg/dL — ABNORMAL HIGH (ref 70–99)

## 2023-06-15 MED ORDER — IPRATROPIUM-ALBUTEROL 0.5-2.5 (3) MG/3ML IN SOLN
3.0000 mL | RESPIRATORY_TRACT | Status: DC
  Administered 2023-06-15 – 2023-06-20 (×29): 3 mL via RESPIRATORY_TRACT
  Filled 2023-06-15 (×30): qty 3

## 2023-06-15 MED ORDER — PANTOPRAZOLE SODIUM 40 MG PO TBEC
40.0000 mg | DELAYED_RELEASE_TABLET | Freq: Every day | ORAL | Status: DC
  Administered 2023-06-15 – 2023-06-19 (×5): 40 mg via ORAL
  Filled 2023-06-15 (×5): qty 1

## 2023-06-15 MED ORDER — MAGNESIUM SULFATE 2 GM/50ML IV SOLN
2.0000 g | Freq: Once | INTRAVENOUS | Status: AC
  Administered 2023-06-15: 2 g via INTRAVENOUS
  Filled 2023-06-15: qty 50

## 2023-06-15 MED ORDER — PREDNISONE 20 MG PO TABS: 20.0000 mg | ORAL_TABLET | Freq: Every day | ORAL | Status: DC

## 2023-06-15 MED ORDER — PREDNISONE 20 MG PO TABS
40.0000 mg | ORAL_TABLET | Freq: Every day | ORAL | Status: AC
  Administered 2023-06-16 – 2023-06-20 (×5): 40 mg via ORAL
  Filled 2023-06-15 (×5): qty 2

## 2023-06-15 NOTE — Plan of Care (Signed)

## 2023-06-15 NOTE — Progress Notes (Signed)
Speech Language Pathology Treatment: Dysphagia  Patient Details Name: [name] [name] MRN: [id number] DOB: 11/05/1955 Today's Date: 06/15/2023 Time: 3086-5784 SLP Time Calculation (min) (ACUTE ONLY): 10 min  Assessment / Plan / Recommendation Clinical Impression  Patient seen by SLP for skilled treatment focused on dysphagia goals. She was awake and alert, sitting up in bed when SLP arrived. She did report that she is feeling "panicky" secondary to her breathing difficulties. She had some anxiety secondary to claustrophobia with BiPAP last night but currently she is on SpO2 via nasal cannula and is "trying to relax". She was receptive to having something to drink and SLP retrieved a juice and thickened it to nectar thick consistency. She drank via straws sips with timely swallow initiation and no overt s/s aspiration. SLP explained plan for MBS secondary to her h/o and current dysphagia. Patient was understanding and voiced agreement to plan for this test likely next date. SLP will continue to follow.    HPI HPI: Patient is a 67 y.o. female with PMH: COPD, current tobacco use, CAD. She presented to the hospital on 06/12/23 with SOB/acute exacerbation of COPD. She recently was treated for this by her PCP eralier this month. On 11/29 she worsened over the course of the day and had an aspiration event resulting in PCCM consult and overnight need for BiPAP. She was transitioned off BiPAP by morning of 11/30. She was made NPO and SLP swallow evaluation ordered. She was evaluated by SLP for swallowing function in 2021 at [facility] and given recommendations to follow reflux precautions, take her time eating/drinking, take small bites and sips.      SLP Plan  Continue with current plan of care;MBS      Recommendations for follow up therapy are one component of a multi-disciplinary discharge planning process, led by the attending physician.  Recommendations may be updated based on  patient status, additional functional criteria and insurance authorization.    Recommendations  Diet recommendations: Dysphagia 3 (mechanical soft);Nectar-thick liquid Liquids provided via: Cup;Straw Medication Administration: Whole meds with puree Supervision: Patient able to self feed Compensations: Slow rate;Small sips/bites Postural Changes and/or Swallow Maneuvers: Seated upright 90 degrees                  Oral care BID   None Dysphagia, pharyngeal phase (R13.13)     Continue with current plan of care;MBS   [name] Nevin, MA, CCC-SLP Speech Therapy

## 2023-06-15 NOTE — Telephone Encounter (Signed)
Please arrange hospital follow-up with next available provider in the next 3 to 4 weeks.  Thank you.

## 2023-06-15 NOTE — Progress Notes (Signed)
HD#1 SUBJECTIVE:  Patient Summary: [name] [name] is a 67 y.o. female with past medical history of COPD, hypertension, prediabetes, CAD status post stents presented to ED with 1 week of dyspnea and productive cough and admitted for acute hypoxic respiratory failure/COPD exacerbation.  11/29-11/30 Transferred to ICU concerning increasing oxygen requirement/possible aspiration event/BiPAP therapy overnight.  Patient received IV Solu-Medrol 60 mg twice daily, triple therapy plus PRNs, IV Rocephin, azithromycin.  Interim History: Patient is evaluated today at bedside, she reports overall doing well.  Patient reports that she slept all night, had a good breakfast this morning.  She reports her breathing has improved, denies any chest pain.  Denies any abdominal pain, nausea, vomiting.  Reports she had a bowel movement.  Patient states that she does not use oxygen at home.  She is currently on 3 L oxygen saturating above 96%.  OBJECTIVE:  Vital Signs: Vitals:   06/15/23 0717 06/15/23 0800 06/15/23 0900 06/15/23 0919  BP: (!) 146/76     Pulse: 74 67 67 77  Resp: (!) 22 (!) 22 19 16   Temp: 97.7 F (36.5 C)     TempSrc: Oral     SpO2: 100% 100% 98% 97%  Weight:      Height:       Supplemental O2: Nasal Cannula SpO2: 97 % O2 Flow Rate (L/min): 3 L/min FiO2 (%): 40 %  Filed Weights   06/12/23 0746 06/13/23 0447 06/15/23 0355  Weight: 67 kg 66.3 kg 66.9 kg     Intake/Output Summary (Last 24 hours) at 06/15/2023 1030 Last data filed at 06/15/2023 1308 Gross per 24 hour  Intake 400 ml  Output 501 ml  Net -101 ml   Net IO Since Admission: 739.8 mL [06/15/23 1030]  Physical Exam: Physical Exam  General: Patient is resting comfortably in bed in no acute distress  Cardio: Regular rate and rhythm, no murmurs, rubs or gallops.  No lower extremity edema bilaterally. Pulmonary: + Prolonged expiratory wheeze bilaterally Abdomen: Soft, nontender with normoactive bowel sounds with no rebound  or guarding  Neuro: Alert and oriented x3, no focal deficits  Skin: No rashes noted   Patient Lines/Drains/Airways Status     Active Line/Drains/Airways     Name Placement date Placement time Site Days   Peripheral IV 06/12/23 22 G Anterior;Right Forearm 06/12/23  1316  Forearm  3             ASSESSMENT/PLAN:  Assessment: Principal Problem:   Acute exacerbation of chronic obstructive pulmonary disease (COPD) (HCC) Active Problems:   COPD exacerbation (HCC)   Acute hypoxic respiratory failure (HCC)   Aspiration into airway   COPD with acute exacerbation (HCC)   Full code status  [name] [name] is a 67 y.o. person living with a history of COPD, HFpEF, CAD status post stents, prediabetes who presented with shortness of breath and productive cough and admitted for acute hypoxic respiratory failure secondary to acute COPD exacerbation.  Plan:  #Acute hypoxic respiratory failure #Acute COPD exacerbation #Leukocytosis Presented with several days of progressive dyspnea and productive cough for 1 week, found to be hypoxic in ED, placed on 2L nasal cannula. Elevated WBC 13.2. BNP mildly elevated to 106.4. Respiratory panel negative for COVID, flu, RSV. Patient was transferred to ICU on 11/29-11/30 for progressive shortness of breath and increased oxygen requirements. Patient was placed on BiPAP overnight, received IV magnesium, IV Solu-Medrol 60mg , with addition of Rocephin to azithromycin.  Expectorated sputum culture pending, respiratory panel positive for  parainfluenza virus 4.  Upon evaluation today, she was overall doing well.  She was transition to 3 L oxygen via nasal cannula, saturating above 96%.  She denies any concerns at this time. Reporting her breathing has improved.  Appreciate PCCM recommendation.  Plan:  -IV Solu-Medrol 60 mg today, transition to prednisone 40 mg for 5 days tomorrow, followed by prednisone 20 mg for 5 days for steroid taper -Continue IV rocephin 3/5   -Continue Brovana twice daily and Yuperlri daily  -Continue Proventil nebulization every 2 hours as needed for wheezing and shortness of breath -Continue Pulmicort nebulization twice daily -Continue DuoNebs every 4 hours as needed for wheezing and shortness of breath -Continue Mucinex 600 mg twice daily for congestion -Continue Singulair 10 mg daily at bedtime -Wean oxygen as tolerated with goal O2 sat >88% -Consider PT eval tomorrow    #HTN Continue home medications losartan 25 mg daily, amlodipine 5 mg daily. IV hydralazine 10 mg every 4 as needed if SBP > 160   Chronic conditions: CAD s/p stent ( 2018): Continue home medication Brilinta 60 mg twice daily, Lipitor 80 mg daily, nebivolol 10 mg  HFpEF: Continue Lasix 20 mg daily Hypothyroidism: Continue Synthroid 112 mcg daily Anxiety/Depression: Continue home medication Klonopin 0.5 mg nightly, doxepin 75 mg daily, Lexapro 20 mg daily, BuSpar 30 mg AM at 0800, 15 mg 1200, 15 mg 1800, mirtazapine 45 mg daily Prediabetes: Hold metformin 500 mg  Best Practice: Diet: Dysphagia type III IVF: Fluids: none, Rate: None VTE: enoxaparin (LOVENOX) injection 40 mg Start: 06/13/23 2000 Code: Full AB: IV Rocephin 3/5 Therapy Recs: Pending, DME: other pending DISPO: Anticipated discharge  1-2 days  to  pending  pending New oxygen.  Signature: Jeral Pinch, D.O.  Internal Medicine Resident, PGY-1 [name]  Pager: [phone] 10:30 AM, 06/15/2023   Please contact the on call pager after 5 pm and on weekends at [phone].

## 2023-06-15 NOTE — Progress Notes (Signed)
   06/15/23 1531  Oxygen Therapy/Pulse Ox  O2 Device (S)  Nasal Cannula  O2 Therapy Oxygen  O2 Flow Rate (L/min) 2 L/min   Pt titrated off BiPAP to 2L [place]. Pt states her breathing is a lot better now and is VSS at this time.

## 2023-06-15 NOTE — Progress Notes (Signed)
NAME:  [name] [name], MRN:  [id number], DOB:  05-02-1956, LOS: 1 ADMISSION DATE:  06/12/2023, CONSULTATION DATE:  06/13/23 REFERRING MD:  Allena Katz IMTs, CHIEF COMPLAINT:  SOB   History of Present Illness:  67 yo F PMH tobacco use (40+ years, currently 1 ppd) COPD on trelegy and albuterol, CAD who was admitted to IMTS 11/28 with SOB/ AECOPD. Notably had a recent AECOPD tx outpt by her PCP earlier this month. Started on duonebs, pred. Required a little [place]  11/29 progressive SOB over the course of the day, received mag and IV steroids w transient improvement, and towards end of shift was on NRB after an aspiration event with reported decr LOC.   PCCM consulted in this setting   Pertinent  Medical History  CAD Tobacco use COPD Significant Hospital Events: Including procedures, antibiotic start and stop dates in addition to other pertinent events   11/28 admit to IMTS azithro rocephin pred BD 11/29 worse over course of day. Mag, 1x solumedrol. Incr [place]. Aspiration event. NRB. PCCM consult. ICU overnight for BiPAP  11/30 txf back to progressive   Interim History / Subjective:  Overall better.  Feels little bit rundown today.  On nasal cannula satting 100% 3 L.  Discussed with RT de-escalating oxygen therapy goal O2 sat 88%. Objective   Blood pressure (!) 146/76, pulse 67, temperature 97.7 F (36.5 C), temperature source Oral, resp. rate (!) 22, height 4\' 10"  (1.473 m), weight 66.9 kg, SpO2 100%.    Vent Mode: PCV;BIPAP FiO2 (%):  [40 %] 40 % Set Rate:  [15 bmp] 15 bmp PEEP:  [5 cmH20] 5 cmH20 Pressure Support:  [8 cmH20] 8 cmH20   Intake/Output Summary (Last 24 hours) at 06/15/2023 0950 Last data filed at 06/15/2023 0717 Gross per 24 hour  Intake 400 ml  Output 501 ml  Net -101 ml   Filed Weights   06/12/23 0746 06/13/23 0447 06/15/23 0355  Weight: 67 kg 66.3 kg 66.9 kg    Examination: General: Chronically and acutely ill F NAD  HENT: NCAT BiPAP in place  Lungs: Symmetrical  chest expansion no accessory use Vance. Sats indicate ability to wean Cardiovascular: rrr s1s2  Abdomen: soft ndnt  Extremities: No edema, cyanosis or clubbing  Neuro: AAOx4  GU: deferred   Resolved Hospital Problem list     Assessment & Plan:   Acute hypoxic respiratory failure AECOPD due to parainfluenza Tobacco use disorder Aspiration event -- cxr without obvious infiltrate, but had witnessed aspiration event  P -Solu-Medrol de-escalated 60 mg IV daily, subsequently prednisone 40 mg x 5 days starting 12/2 followed by prednisone 20 mg for 5 days for steroid taper -triple therapy + PRNs -monteleukast  -f/u sputum cx  -Continue ceftriaxone, total course recommend 5 days, status post 3 days of azithromycin  -- Wean oxygen as tolerated goal O2 sat 88%, ideally she would weaned off oxygen prior to discharge given no preceding oxygen requirement  Will send message to arrange outpatient follow-up.  PCCM will sign off.  Best Practice (right click and "Reselect all SmartList Selections" daily)   Per Primary  Labs   CBC: Recent Labs  Lab 06/12/23 0805 06/13/23 0332  WBC 13.2* 13.8*  NEUTROABS 9.9*  --   HGB 13.7 14.3  HCT 42.1 42.0  MCV 100.0 98.4  PLT 354 378    Basic Metabolic Panel: Recent Labs  Lab 06/12/23 0805 06/13/23 0332  NA 134* 139  K 4.0 4.8  CL 100 105  CO2 22  25  GLUCOSE 136* 150*  BUN 13 20  CREATININE 0.95 0.88  CALCIUM 8.4* 9.2   GFR: Estimated Creatinine Clearance: 50.9 mL/min (by C-G formula based on SCr of 0.88 mg/dL). Recent Labs  Lab 06/12/23 0805 06/13/23 0332  WBC 13.2* 13.8*    Liver Function Tests: No results for input(s): "AST", "ALT", "ALKPHOS", "BILITOT", "PROT", "ALBUMIN" in the last 168 hours. No results for input(s): "LIPASE", "AMYLASE" in the last 168 hours. No results for input(s): "AMMONIA" in the last 168 hours.  ABG    Component Value Date/Time   PHART 7.365 09/15/2017 1744   PCO2ART 50.8 (H) 09/15/2017 1744    PO2ART 80.0 (L) 09/15/2017 1744   HCO3 29.0 (H) 09/15/2017 1744   TCO2 31 09/15/2017 1744   O2SAT 95.0 09/15/2017 1744     Coagulation Profile: No results for input(s): "INR", "PROTIME" in the last 168 hours.  Cardiac Enzymes: No results for input(s): "CKTOTAL", "CKMB", "CKMBINDEX", "TROPONINI" in the last 168 hours.  HbA1C: HbA1c, POC (prediabetic range)  Date/Time Value Ref Range Status  01/17/2023 10:28 AM 5.7 5.7 - 6.4 % Final  06/15/2020 10:27 AM 6.0 5.7 - 6.4 % Final   HbA1c, POC (controlled diabetic range)  Date/Time Value Ref Range Status  09/14/2021 09:43 AM 5.7 0.0 - 7.0 % Final   Hgb A1c MFr Bld  Date/Time Value Ref Range Status  12/18/2021 03:40 PM 5.3 4.8 - 5.6 % Final    Comment:    (NOTE) Pre diabetes:          5.7%-6.4%  Diabetes:              >6.4%  Glycemic control for   <7.0% adults with diabetes   10/05/2021 03:24 PM 5.8 (H) 4.8 - 5.6 % Final    Comment:    (NOTE) Pre diabetes:          5.7%-6.4%  Diabetes:              >6.4%  Glycemic control for   <7.0% adults with diabetes     CBG: No results for input(s): "GLUCAP" in the last 168 hours.  CCT n/a    Karren Burly, MD [facility] Pulmonary/Critical Care Medicine Amion for pager  06/15/2023, 9:50 AM

## 2023-06-16 ENCOUNTER — Inpatient Hospital Stay (HOSPITAL_COMMUNITY): Payer: Medicare HMO

## 2023-06-16 ENCOUNTER — Other Ambulatory Visit: Payer: Self-pay

## 2023-06-16 DIAGNOSIS — J441 Chronic obstructive pulmonary disease with (acute) exacerbation: Secondary | ICD-10-CM | POA: Diagnosis not present

## 2023-06-16 LAB — CULTURE, RESPIRATORY W GRAM STAIN

## 2023-06-16 LAB — BASIC METABOLIC PANEL
Anion gap: 8 (ref 5–15)
BUN: 33 mg/dL — ABNORMAL HIGH (ref 8–23)
CO2: 29 mmol/L (ref 22–32)
Calcium: 8.9 mg/dL (ref 8.9–10.3)
Chloride: 103 mmol/L (ref 98–111)
Creatinine, Ser: 1.04 mg/dL — ABNORMAL HIGH (ref 0.44–1.00)
GFR, Estimated: 59 mL/min — ABNORMAL LOW (ref >60)
Glucose, Bld: 136 mg/dL — ABNORMAL HIGH (ref 70–99)
Potassium: 4.5 mmol/L (ref 3.5–5.1)
Sodium: 140 mmol/L (ref 135–145)

## 2023-06-16 LAB — CBC
HCT: 42.2 % (ref 36.0–46.0)
Hemoglobin: 13.3 g/dL (ref 12.0–15.0)
MCH: 32.5 pg (ref 26.0–34.0)
MCHC: 31.5 g/dL (ref 30.0–36.0)
MCV: 103.2 fL — ABNORMAL HIGH (ref 80.0–100.0)
Platelets: 361 10*3/uL (ref 150–400)
RBC: 4.09 MIL/uL (ref 3.87–5.11)
RDW: 14.9 % (ref 11.5–15.5)
WBC: 11.6 10*3/uL — ABNORMAL HIGH (ref 4.0–10.5)
nRBC: 0 % (ref 0.0–0.2)

## 2023-06-16 LAB — MAGNESIUM: Magnesium: 2.4 mg/dL (ref 1.7–2.4)

## 2023-06-16 MED ORDER — LORAZEPAM 2 MG/ML IJ SOLN
0.5000 mg | Freq: Once | INTRAMUSCULAR | Status: AC
  Administered 2023-06-16: 0.5 mg via INTRAVENOUS
  Filled 2023-06-16: qty 1

## 2023-06-16 MED ORDER — METHYLPREDNISOLONE SODIUM SUCC 125 MG IJ SOLR
80.0000 mg | Freq: Once | INTRAMUSCULAR | Status: AC
  Administered 2023-06-16: 80 mg via INTRAVENOUS
  Filled 2023-06-16: qty 2

## 2023-06-16 NOTE — Plan of Care (Signed)

## 2023-06-16 NOTE — Procedures (Signed)
Modified Barium Swallow Study  Patient Details  Name: [name] [name] MRN: [id number] Date of Birth: [birth year]  Today's Date: 06/16/2023  Modified Barium Swallow completed.  Full report located under Chart Review in the Imaging Section.  History of Present Illness Patient is a 67 y.o. female with PMH: COPD, current tobacco use, CAD. She presented to the hospital on 06/12/23 with SOB/acute exacerbation of COPD. She recently was treated for this by her PCP eralier this month. On 11/29 she worsened over the course of the day and had an aspiration event resulting in PCCM consult and overnight need for BiPAP. She was transitioned off BiPAP by morning of 11/30. She was made NPO and SLP swallow evaluation ordered. She was evaluated by SLP for swallowing function in 2021 at [facility] and given recommendations to follow reflux precautions, take her time eating/drinking, take small bites and sips.   Clinical Impression Pt presents with a mild oropharyngeal dysphagia c/b delayed swallow initiation, reduced hyolaryngeal elevation and excursion, incomplete laryngeal closure, and decreased UES opening.  These deficits resulted in frequent transient penetration of thin liquid.  During sequential cup sip task there was sensed aspiration of thin liquid which penetrated prior to the swallow with aspiration during swallow.  Pt's reflexive cough was weak and ineffective at clearing aspiration.  Pt benefited from chin tuck to prevent aspiration and single sips.  With straw there was no increase in penetration and it appeared to keep head in more neutral position to reduce premature spillage into the pharynx.  Suspected CP bar (see image below).  With pill simulation there was significant esophageal stasis of tablet which lodged in the lower esophagus without clearance during this study.  There was retention and backflow of contrast within the esophagus.  There was reflux to the pyrifom sinuses  following a belch.  Pt reports difficulty getting food to go down has been a problem for her.  Consider further assessment of esophagus.  Recommend mechanical soft diet with thin liquids with use of aspiration precaution, particularly single sips and chin tuck with thin liquids.  Possible CP Bar   Factors that may increase risk of adverse event in presence of aspiration Rubye Oaks & Clearance Coots 2021): Weak cough;Respiratory or GI disease  Swallow Evaluation Recommendations Recommendations: PO diet PO Diet Recommendation: Regular;Thin liquids (Level 0) Liquid Administration via: Cup;Straw Medication Administration: Whole meds with liquid Supervision: Intermittent supervision/cueing for swallowing strategies Swallowing strategies  : Slow rate;Small bites/sips;Chin tuck Postural changes: Position pt fully upright for meals;Stay upright 30-60 min after meals Oral care recommendations: Oral care BID (2x/day) Recommended consults: Consider esophageal assessment      Kerrie Pleasure, MA, CCC-SLP Acute Rehabilitation Services Office: [phone] 06/16/2023,10:10 AM

## 2023-06-16 NOTE — Progress Notes (Signed)
   06/16/23 2247  Vent Select  Invasive or Noninvasive Noninvasive  Adult Vent Y  Adult Ventilator Settings  Vent Type Servo i  Vent Mode BIPAP;PCV  Set Rate 12 bmp  FiO2 (%) 30 %  IPAP 14 cmH20  EPAP 6 cmH20  Pressure Control 8 cmH20  PEEP 6 cmH20  Adult Ventilator Measurements  Peak Airway Pressure 13 L/min  Mean Airway Pressure 8 cmH20  Resp Rate Spontaneous 4 br/min  Resp Rate Total 16 br/min  Exhaled Vt 538 mL  Measured Ve 10.1 L  I:E Ratio Measured 1:2.7  Auto PEEP 0 cmH20  Total PEEP 6 cmH20  SpO2 99 %  Adult Ventilator Alarms  Alarms On Y  Ve High Alarm 20 L/min  Ve Low Alarm 5 L/min  Resp Rate High Alarm 36 br/min  Resp Rate Low Alarm 8  PEEP Low Alarm 3 cmH2O  Press High Alarm 25 cmH2O  VAP Prevention  HOB> 30 Degrees Y  Equipment wiped down Yes  Breath Sounds  Bilateral Breath Sounds Diminished;Expiratory wheezes  Vent Respiratory Assessment  Level of Consciousness Alert  Respiratory Pattern Regular;Unlabored;Symmetrical

## 2023-06-16 NOTE — Evaluation (Signed)
Physical Therapy Evaluation Patient Details Name: [name] [name] MRN: [id number] DOB: 1955/08/05 Today's Date: 06/16/2023  History of Present Illness  Pt is 67 yo female who presents on 06/12/23 with SOB. Acute COPD exacerbation. Aspiration even 11/29 with need for Bipap. PMH:  HTN, CAD with multiple DES, DM2, COPD, anxiety, hypothyroidism, RTC repair 12/24/2021, ongoing tobacco abuse.  Clinical Impression  Pt admitted with above diagnosis. Pt received in bed and very anxious after coming back from swallow study. Pt with quick fatigue with activity and generalized weakness. Needed CGA to stand bedside and deferred ambulation due to fatigue and SOB. SPO2 97% on 2L, dropped to 95% on RA but subjectively pt felt very anxious and more SOB and requested to put O2 back on. Recommend HHPT at d/c.  Pt currently with functional limitations due to the deficits listed below (see PT Problem List). Pt will benefit from acute skilled PT to increase their independence and safety with mobility to allow discharge.           If plan is discharge home, recommend the following: A little help with walking and/or transfers;A little help with bathing/dressing/bathroom;Assistance with cooking/housework;Assist for transportation   Can travel by private Data processing manager (4 wheels)  Recommendations for Other Services  OT consult    Functional Status Assessment Patient has had a recent decline in their functional status and demonstrates the ability to make significant improvements in function in a reasonable and predictable amount of time.     Precautions / Restrictions Precautions Precautions: Fall Restrictions Weight Bearing Restrictions: No Other Position/Activity Restrictions: not on home O2 PTA      Mobility  Bed Mobility Overal bed mobility: Modified Independent             General bed mobility comments: pt able to come to long sitting and to sitting EOB with  increased time but no physical assist    Transfers Overall transfer level: Needs assistance Equipment used: Rolling walker (2 wheels) Transfers: Sit to/from Stand Sit to Stand: Contact guard assist           General transfer comment: CGA for safety    Ambulation/Gait Ambulation/Gait assistance: Contact guard assist Gait Distance (Feet): 3 Feet Assistive device: Rolling walker (2 wheels) Gait Pattern/deviations: Step-to pattern Gait velocity: decreased Gait velocity interpretation: <1.31 ft/sec, indicative of household ambulator   General Gait Details: sidesteps along bedside and then pt fatigued to the point of needing to sit again  Stairs            Wheelchair Mobility     Tilt Bed    Modified Rankin (Stroke Patients Only)       Balance Overall balance assessment: Mild deficits observed, not formally tested                                           Pertinent Vitals/Pain Pain Assessment Pain Assessment: Faces Faces Pain Scale: Hurts even more Pain Location: LLQ from coughing Pain Descriptors / Indicators: Sore Pain Intervention(s): Limited activity within patient's tolerance, Monitored during session    Home Living Family/patient expects to be discharged to:: Private residence Living Arrangements: Alone Available Help at Discharge: Family;Available PRN/intermittently Type of Home: Apartment Home Access: Level entry       Home Layout: One level Home Equipment: Agricultural consultant (2 wheels);Shower seat Additional Comments: daughter  lives nearby    Prior Function Prior Level of Function : Independent/Modified Independent             Mobility Comments: does not drive, takes SCAT. Cares for self       Extremity/Trunk Assessment   Upper Extremity Assessment Upper Extremity Assessment: Generalized weakness    Lower Extremity Assessment Lower Extremity Assessment: Generalized weakness    Cervical / Trunk  Assessment Cervical / Trunk Assessment: Normal  Communication   Communication Communication: Other (comment) (becomes breathless with prolonged conversation) Cueing Techniques: Verbal cues  Cognition Arousal: Alert Behavior During Therapy: Anxious Overall Cognitive Status: Within Functional Limits for tasks assessed                                          General Comments General comments (skin integrity, edema, etc.): pt becomes anxious with all activity. Cues for pursed lip breathing and not holding breaths when transitioning. SPO2 97% on 2L O2, remained 95% on RA in sitting but pt felt more SOB and requested it be replaced    Exercises Other Exercises Other Exercises: worked on breathing exercises and eye focusing exercises to help manage anxiety   Assessment/Plan    PT Assessment Patient needs continued PT services  PT Problem List Decreased strength;Decreased activity tolerance;Decreased balance;Decreased mobility;Pain       PT Treatment Interventions DME instruction;Gait training;Functional mobility training;Therapeutic activities;Therapeutic exercise;Balance training;Patient/family education    PT Goals (Current goals can be found in the Care Plan section)  Acute Rehab PT Goals Patient Stated Goal: return home PT Goal Formulation: With patient Time For Goal Achievement: 06/30/23 Potential to Achieve Goals: Good    Frequency Min 1X/week     Co-evaluation               AM-PAC PT "6 Clicks" Mobility  Outcome Measure Help needed turning from your back to your side while in a flat bed without using bedrails?: None Help needed moving from lying on your back to sitting on the side of a flat bed without using bedrails?: None Help needed moving to and from a bed to a chair (including a wheelchair)?: A Little Help needed standing up from a chair using your arms (e.g., wheelchair or bedside chair)?: A Little Help needed to walk in hospital room?: A  Lot Help needed climbing 3-5 steps with a railing? : Total 6 Click Score: 17    End of Session Equipment Utilized During Treatment: Oxygen Activity Tolerance: Patient limited by fatigue;Treatment limited secondary to medical complications (Comment) (anxiety) Patient left: in bed;with call bell/phone within reach Nurse Communication: Mobility status PT Visit Diagnosis: Muscle weakness (generalized) (M62.81);Pain;Difficulty in walking, not elsewhere classified (R26.2) Pain - Right/Left: Left Pain - part of body:  (LQ)    Time: 0626-9485 PT Time Calculation (min) (ACUTE ONLY): 36 min   Charges:   PT Evaluation $PT Eval Moderate Complexity: 1 Mod PT Treatments $Therapeutic Activity: 8-22 mins PT General Charges $$ ACUTE PT VISIT: 1 Visit         Lyanne Co, PT  Acute Rehab Services Secure chat preferred Office [phone]   Lawana Chambers Maisey Deandrade 06/16/2023, 11:35 AM

## 2023-06-16 NOTE — Progress Notes (Signed)
HD#2 SUBJECTIVE:  Patient Summary: [name] [name] is a 67 y.o. female with past medical history of COPD, hypertension, prediabetes, CAD status post stents presented to ED with 1 week of dyspnea and productive cough and admitted for acute hypoxic respiratory failure/COPD exacerbation.   11/29-11/30 Transferred to ICU concerning increasing oxygen requirement/possible aspiration event/BiPAP therapy overnight.  Patient received IV Solu-Medrol 60 mg twice daily, triple therapy plus PRNs, IV Rocephin, azithromycin  Interim History: Patient is a bedside today, she reports continuous shortness of breath.  She reports that she has these coughing fits where she gets short of breath.  She reports not doing as well compared to yesterday.  Reports that she continues to wheeze. Reports that she was on BiPAP last night.   OBJECTIVE:  Vital Signs: Vitals:   06/16/23 0303 06/16/23 0555 06/16/23 1019 06/16/23 1117  BP: 115/68  (!) 158/64 125/78  Pulse: 60   76  Resp: 16   16  Temp: 97.7 F (36.5 C)   97.6 F (36.4 C)  TempSrc: Oral   Oral  SpO2: 100%   94%  Weight:  67.5 kg    Height:       Supplemental O2: Nasal Cannula SpO2: 94 % O2 Flow Rate (L/min): 2 L/min FiO2 (%): 40 %  Filed Weights   06/13/23 0447 06/15/23 0355 06/16/23 0555  Weight: 66.3 kg 66.9 kg 67.5 kg    No intake or output data in the 24 hours ending 06/16/23 1549 Net IO Since Admission: 789.8 mL [06/16/23 1549]  Physical Exam: Physical Exam General: no acute distress Cardio: Regular rate and rhythm, no murmurs, rubs or gallops.   Pulmonary: +bilateral wheeze   Abdomen: Soft, nontender with normoactive bowel sounds with no rebound or guarding  MSK: ROM intact   Neuro: Alert and oriented x3, no focal deficits   Patient Lines/Drains/Airways Status     Active Line/Drains/Airways     Name Placement date Placement time Site Days   Peripheral IV 06/12/23 22 G Anterior;Right Forearm 06/12/23  1316  Forearm  4              ASSESSMENT/PLAN:  Assessment: Principal Problem:   Acute exacerbation of chronic obstructive pulmonary disease (COPD) (HCC) Active Problems:   COPD exacerbation (HCC)   Acute hypoxic respiratory failure (HCC)   Aspiration into airway   COPD with acute exacerbation (HCC)   Full code status  [name] [name] is a 67 y.o. person living with a history of COPD, HFpEF, CAD status post stents, prediabetes who presented with shortness of breath and productive cough and admitted for acute hypoxic respiratory failure secondary to acute COPD exacerbation.   Plan:   #Acute hypoxic respiratory failure #Acute COPD exacerbation #Leukocytosis Presented with several days of progressive dyspnea and productive cough for 1 week, found to be hypoxic in ED, placed on 2L nasal cannula. Elevated WBC 13.2. BNP mildly elevated to 106.4. Respiratory panel negative for COVID, flu, RSV. Patient was transferred to ICU on 11/29-11/30 for progressive shortness of breath and increased oxygen requirements. Patient was placed on BiPAP overnight, received IV magnesium, IV Solu-Medrol 60mg , with addition of Rocephin to azithromycin. Respiratory panel positive for parainfluenza virus 4.  Expectorant culture showed rare gram-positive cocci, with culture showing few normal respiratory flora, no Staph aureus or Pseudomonas.  Per evaluation today, she appears to be not doing as well compared to yesterday.  She reports that she continues to feel short of breath and has dry cough that makes her  shortness of breath worse.  She is currently on 3 L nasal cannula saturating > 96%. She is given one IV dose of solumedrol 80 mg today for wheezing and shortness of breath.    Plan:  -Prednisone 40 mg for 1/5 days;  - Followed by prednisone 20 mg for 5 days for steroid taper -Continue IV rocephin 4/4, last day.   -Continue Brovana twice daily and Yuperlri daily  -Continue Proventil nebulization every 2 hours as needed for wheezing and  shortness of breath -Continue Pulmicort nebulization twice daily -Continue DuoNebs every 4 hours for wheezing and shortness of breath -Continue Mucinex 600 mg twice daily for congestion -Continue Singulair 10 mg daily at bedtime -PT evaluated and recommended home health PT.   #HTN Continue home medications losartan 25 mg daily, amlodipine 5 mg daily. IV hydralazine 10 mg every 4 as needed if SBP > 160    Chronic conditions: CAD s/p stent ( 2018): Continue home medication Brilinta 60 mg twice daily, Lipitor 80 mg daily, nebivolol 10 mg  HFpEF: Continue Lasix 20 mg daily Hypothyroidism: Continue Synthroid 112 mcg daily Anxiety/Depression: Continue home medication Klonopin 0.5 mg nightly, doxepin 75 mg daily, Lexapro 20 mg daily, BuSpar 30 mg AM at 0800, 15 mg 1200, 15 mg 1800, mirtazapine 45 mg daily Prediabetes: Hold metformin 500 mg  Best Practice: Diet: Dysphagia type III diet IVF: Fluids: None, Rate: None VTE: enoxaparin (LOVENOX) injection 40 mg Start: 06/13/23 2000 Code: Full AB: IV Rocephin/4 Therapy Recs: Home Health, DME: other None  DISPO: Anticipated discharge  2-3 days  to  home  pending  medical managment .  Signature: Jeral Pinch, D.O.  Internal Medicine Resident, PGY-1 [name]  Pager: [phone] 3:49 PM, 06/16/2023   Please contact the on call pager after 5 pm and on weekends at [phone].

## 2023-06-16 NOTE — Progress Notes (Signed)
NT noted pt not in bed, bipap hoses pulled off of machine and mask laying in the bed, feces on the floor.   Pt found in bathoom having difficulty breathing and assisted back to bed, applied bipap Pt advised to call for assistance

## 2023-06-16 NOTE — Progress Notes (Signed)
   06/16/23 1749  Oxygen Therapy/Pulse Ox  O2 Device (S)  Nasal Cannula  O2 Therapy Oxygen  O2 Flow Rate (L/min) 2 L/min   Pt was placed on 2L [place]. Plan to wear over night. Pt is VSS at this time.

## 2023-06-16 NOTE — Progress Notes (Signed)
   06/16/23 2016  Vent Select  Invasive or Noninvasive Noninvasive  Adult Vent Y  Adult Ventilator Settings  Vent Type Servo i  Vent Mode BIPAP;PCV  Set Rate 12 bmp  FiO2 (%) 30 %  IPAP 14 cmH20  EPAP 6 cmH20  Pressure Control 8 cmH20  PEEP 6 cmH20  Adult Ventilator Measurements  Peak Airway Pressure 13 L/min  Mean Airway Pressure 8 cmH20  Resp Rate Spontaneous 5 br/min  Resp Rate Total 17 br/min  Exhaled Vt 630 mL  Measured Ve 12.6 L  I:E Ratio Measured 1:2.7  Auto PEEP 0 cmH20  Total PEEP 6 cmH20  Adult Ventilator Alarms  Alarms On Y  Ve High Alarm 20 L/min  Ve Low Alarm 5 L/min  Resp Rate High Alarm 36 br/min  Resp Rate Low Alarm 8  PEEP Low Alarm 3 cmH2O  Press High Alarm 25 cmH2O  VAP Prevention  HOB> 30 Degrees Y

## 2023-06-17 DIAGNOSIS — J441 Chronic obstructive pulmonary disease with (acute) exacerbation: Secondary | ICD-10-CM | POA: Diagnosis not present

## 2023-06-17 LAB — CBC
HCT: 44.2 % (ref 36.0–46.0)
Hemoglobin: 14.2 g/dL (ref 12.0–15.0)
MCH: 32.1 pg (ref 26.0–34.0)
MCHC: 32.1 g/dL (ref 30.0–36.0)
MCV: 99.8 fL (ref 80.0–100.0)
Platelets: 482 10*3/uL — ABNORMAL HIGH (ref 150–400)
RBC: 4.43 MIL/uL (ref 3.87–5.11)
RDW: 14.6 % (ref 11.5–15.5)
WBC: 12.4 10*3/uL — ABNORMAL HIGH (ref 4.0–10.5)
nRBC: 0 % (ref 0.0–0.2)

## 2023-06-17 LAB — BASIC METABOLIC PANEL
Anion gap: 11 (ref 5–15)
BUN: 22 mg/dL (ref 8–23)
CO2: 30 mmol/L (ref 22–32)
Calcium: 9.5 mg/dL (ref 8.9–10.3)
Chloride: 95 mmol/L — ABNORMAL LOW (ref 98–111)
Creatinine, Ser: 0.72 mg/dL (ref 0.44–1.00)
GFR, Estimated: >60 mL/min (ref >60)
Glucose, Bld: 114 mg/dL — ABNORMAL HIGH (ref 70–99)
Potassium: 4.3 mmol/L (ref 3.5–5.1)
Sodium: 136 mmol/L (ref 135–145)

## 2023-06-17 LAB — MAGNESIUM: Magnesium: 2.1 mg/dL (ref 1.7–2.4)

## 2023-06-17 MED ORDER — GUAIFENESIN-DM 100-10 MG/5ML PO SYRP
5.0000 mL | ORAL_SOLUTION | ORAL | Status: DC | PRN
  Administered 2023-06-17 – 2023-06-18 (×4): 5 mL via ORAL
  Filled 2023-06-17 (×4): qty 5

## 2023-06-17 MED ORDER — GABAPENTIN 300 MG PO CAPS
300.0000 mg | ORAL_CAPSULE | Freq: Three times a day (TID) | ORAL | Status: DC
  Administered 2023-06-17 – 2023-06-20 (×12): 300 mg via ORAL
  Filled 2023-06-17 (×12): qty 1

## 2023-06-17 MED ORDER — BENZONATATE 100 MG PO CAPS
100.0000 mg | ORAL_CAPSULE | Freq: Two times a day (BID) | ORAL | Status: DC | PRN
  Administered 2023-06-17: 100 mg via ORAL
  Filled 2023-06-17: qty 1

## 2023-06-17 NOTE — Care Management Important Message (Signed)
Important Message  Patient Details  Name: [name] [name] MRN: [id number] Date of Birth: September 30, 1955   Important Message Given:  Yes - Medicare IM     Sherilyn Banker 06/17/2023, 2:48 PM

## 2023-06-17 NOTE — Progress Notes (Signed)
   06/17/23 2037  Vent Select  Invasive or Noninvasive Noninvasive  Adult Vent Y  Adult Ventilator Settings  Vent Type Servo i  Vent Mode BIPAP;PCV  FiO2 (%) 30 %  I Time 30 Sec(s)  IPAP 14 cmH20  EPAP 6 cmH20  Pressure Control 8 cmH20  PEEP 6 cmH20  Adult Ventilator Measurements  Peak Airway Pressure 14 L/min  Mean Airway Pressure 8 cmH20  Resp Rate Spontaneous 12 br/min  Resp Rate Total 24 br/min  Exhaled Vt 669 mL  Measured Ve 10.2 L  I:E Ratio Measured 1:2.1  Auto PEEP 0 cmH20  Total PEEP 5 cmH20  SpO2 96 %  Adult Ventilator Alarms  Alarms On Y  Ve High Alarm 20 L/min  Ve Low Alarm 5 L/min  Resp Rate High Alarm 36 br/min  Resp Rate Low Alarm 8  PEEP Low Alarm 3 cmH2O  Press High Alarm 25 cmH2O  VAP Prevention  HOB> 30 Degrees Y  Breath Sounds  Bilateral Breath Sounds Clear;Expiratory wheezes  Vent Respiratory Assessment  Level of Consciousness Alert  Respiratory Pattern Regular;Unlabored;Symmetrical

## 2023-06-17 NOTE — Plan of Care (Signed)
  Problem: Health Behavior/Discharge Planning: Goal: Ability to manage health-related needs will improve Outcome: Not Progressing   Problem: Clinical Measurements: Goal: Respiratory complications will improve Outcome: Not Progressing   Problem: Activity: Goal: Risk for activity intolerance will decrease Outcome: Not Progressing   Problem: Nutrition: Goal: Adequate nutrition will be maintained Outcome: Not Progressing   

## 2023-06-17 NOTE — Progress Notes (Signed)
HD#3 SUBJECTIVE:  Patient Summary: [name] [name] is a 67 y.o. with a pertinent PMH of COPD, hypertension, prediabetes, CAD status post stents, who presented with 1 week of dyspnea and productive cough and admitted for acute hypoxic respiratory failure secondary to COPD exacerbation.   Overnight Events: None  Interim History: Patient is evaluated bedside today.  She reports that she is not doing so well, she feels down, she does not feel like doing anything.  Denies any chest pain, no abdominal pain, no nausea or vomiting.  Reports that she ate her breakfast this morning.   OBJECTIVE:  Vital Signs: Vitals:   06/17/23 0746 06/17/23 0751 06/17/23 0756 06/17/23 0800  BP:      Pulse: 77     Resp: 19     Temp:      TempSrc:      SpO2: 98% 98% 98% 98%  Weight:      Height:       Supplemental O2: Nasal Cannula SpO2: 98 % O2 Flow Rate (L/min): 4 L/min FiO2 (%): 36 %  Filed Weights   06/15/23 0355 06/16/23 0555 06/17/23 0326  Weight: 66.9 kg 67.5 kg 73 kg     Intake/Output Summary (Last 24 hours) at 06/17/2023 6606 Last data filed at 06/16/2023 1400 Gross per 24 hour  Intake --  Output 200 ml  Net -200 ml   Net IO Since Admission: 589.8 mL [06/17/23 0808]  Physical Exam: Physical Exam  General: Appears ill, no acute distress Cardiovascular: Regular rate, regular rhythm, no murmurs or rubs or gallops Pulmonary: Diffuse bilateral wheeze Abdomen: Soft, nontender, nondistended, bowel sounds present MSK: Range of motion intact Neuro: Alert, no focal deficits  Patient Lines/Drains/Airways Status     Active Line/Drains/Airways     Name Placement date Placement time Site Days   Peripheral IV 06/12/23 22 G Anterior;Right Forearm 06/12/23  1316  Forearm  5             ASSESSMENT/PLAN:  Assessment: Principal Problem:   Acute exacerbation of chronic obstructive pulmonary disease (COPD) (HCC) Active Problems:   COPD exacerbation (HCC)   Acute hypoxic respiratory  failure (HCC)   Aspiration into airway   COPD with acute exacerbation (HCC)   Full code status  [name] [name] is a 67 y.o. person living with a history of COPD, HFpEF, CAD status post stents, prediabetes who presented with shortness of breath and productive cough and admitted for acute hypoxic respiratory failure secondary to acute COPD exacerbation.   Plan:  #Acute hypoxic respiratory failure #Acute COPD exacerbation #Leukocytosis Presented with several days of progressive dyspnea and productive cough for 1 week, found to be hypoxic in ED, placed on 2L nasal cannula. Elevated WBC 13.2. BNP mildly elevated to 106.4. Respiratory panel negative for COVID, flu, RSV. Patient was transferred to ICU on 11/29-11/30 for progressive shortness of breath and increased oxygen requirements. Patient was placed on BiPAP overnight, received IV magnesium, IV Solu-Medrol 60mg , with addition of Rocephin to azithromycin. Extended Respiratory panel positive for parainfluenza virus 4.  Expectorant culture showed rare gram-positive cocci, with culture showing few normal respiratory flora, no Staph aureus or Pseudomonas.    Per evaluation today, afebrile, on 2 L of oxygen via nasal cannula saturating above 96%.  She is still wheezing per lung auscultation. Encouraged mobility and wean oxygen today.    Plan:  -Prednisone 40 mg for 2/5 days;  - Followed by prednisone 20 mg for 5 days for steroid taper -Continue IV rocephin  4/4, last day.   -Continue Brovana twice daily and Yuperlri daily  -Continue Proventil nebulization every 2 hours as needed for wheezing and shortness of breath -Continue Pulmicort nebulization twice daily -Continue DuoNebs every 4 hours for wheezing and shortness of breath -Continue Mucinex 600 mg twice daily for congestion -Added Tessalon capsules for cough  -Continue Singulair 10 mg daily at bedtime -PT evaluated and recommended home health PT.   #HTN Continue home medications losartan 25  mg daily, amlodipine 5 mg daily. IV hydralazine 10 mg every 4 as needed if SBP > 160    Chronic conditions: CAD s/p stent ( 2018): Continue home medication Brilinta 60 mg twice daily, Lipitor 80 mg daily, nebivolol 10 mg  HFpEF: Continue Lasix 20 mg daily Hypothyroidism: Continue Synthroid 112 mcg daily Anxiety/Depression: Continue home medication Klonopin 0.5 mg nightly, doxepin 75 mg daily, Lexapro 20 mg daily, BuSpar 30 mg AM at 0800, 15 mg 1200, 15 mg 1800, mirtazapine 45 mg daily Prediabetes: Hold metformin 500 mg.  Best Practice: Diet: Dysphagia type III diet IVF: Fluids: None, Rate: None VTE: enoxaparin (LOVENOX) injection 40 mg Start: 06/13/23 2000 Code: Full AB: None Therapy Recs: Home Health, DME: none DISPO: Anticipated discharge  1-2 days  to Home pending  medical management .  Signature: Jeral Pinch, D.O.  Internal Medicine Resident, PGY-1 [name]  Pager: [phone] 8:08 AM, 06/17/2023   Please contact the on call pager after 5 pm and on weekends at [phone].

## 2023-06-17 NOTE — Progress Notes (Addendum)
Mobility Specialist Progress Note:   06/17/23 1212  Mobility  Activity Ambulated with assistance in room  Level of Assistance Contact guard assist, steadying assist  Assistive Device None  Distance Ambulated (ft) 30 ft  Activity Response Tolerated fair  Mobility Referral Yes  $Mobility charge 1 Mobility  Mobility Specialist Start Time (ACUTE ONLY) 1145  Mobility Specialist Stop Time (ACUTE ONLY) 1205  Mobility Specialist Time Calculation (min) (ACUTE ONLY) 20 min   Pre Mobility: 96% SpO2 RA During Mobility: 89 HR , 90%-95% SpO2 2 L Post Mobility: 95% SpO2 2 L  Pt received in bed, agreeable to mobility. Pt c/o SOB prior to mobility, found with [place] slightly off of nose with sats >95%. Pt c/o feeling wobbly d/t dizziness during ambulation requesting seated break. Rated dizziness 7/10. After recovery pt able to ambulate to chair w/o fault. While sitting in chair pt displayed heavy ongoing cough stating  "I can't breathe". Pursed lip breathing encouraged. Pt also c/o L sided pain during coughing spell. RN notified. Pt left in chair with call bell in reach and all needs met.   Leory Plowman  Mobility Specialist Please contact via [employer] office at [phone]

## 2023-06-17 NOTE — Evaluation (Signed)
Occupational Therapy Evaluation Patient Details Name: [name] [name] MRN: [id number] DOB: [birth year] Today's Date: 06/17/2023   History of Present Illness Pt is 67 yo female who presents on 06/12/23 with SOB. Acute COPD exacerbation. Aspiration even 11/29 with need for Bipap. PMH:  HTN, CAD with multiple DES, DM2, COPD, anxiety, hypothyroidism, RTC repair 12/24/2021, ongoing tobacco abuse.   Clinical Impression   PTA, pt lives alone and typically Independent with ADLs, household IADLs and mobility without AD. Pt presents now with deficits in cardiopulmonary tolerance, strength and standing balance. Pt also anxious with SOB during activity. Pt received transferring back to bed from [facility], mild unsteadiness in standing that improves with activity and UE support. Pt requires no more than CGA for ADLs though 3/4 DOE noted. Emphasis on breathing techniques, energy conservation and IS/flutter valve use. Pending acute progression, recommend HHOT at DC.        If plan is discharge home, recommend the following: A little help with bathing/dressing/bathroom;Assistance with cooking/housework;Assist for transportation    Functional Status Assessment  Patient has had a recent decline in their functional status and demonstrates the ability to make significant improvements in function in a reasonable and predictable amount of time.  Equipment Recommendations  BSC/3in1    Recommendations for Other Services       Precautions / Restrictions Precautions Precautions: Fall;Other (comment) Precaution Comments: monitor SOB Restrictions Weight Bearing Restrictions: No      Mobility Bed Mobility Overal bed mobility: Modified Independent                  Transfers Overall transfer level: Needs assistance Equipment used: None Transfers: Sit to/from Stand, Bed to chair/wheelchair/BSC Sit to Stand: Supervision, Contact guard assist           General transfer comment: had pivoted back to bed from  [facility] without assist. On next stand with OT present, pt with unsteadiness and needed to sit back down on bed but then able to stand again with CGA & holding to bedrail      Balance Overall balance assessment: Mild deficits observed, not formally tested                                         ADL either performed or assessed with clinical judgement   ADL Overall ADL's : Needs assistance/impaired Eating/Feeding: Independent   Grooming: Set up;Sitting   Upper Body Bathing: Set up   Lower Body Bathing: Contact guard assist;Sitting/lateral leans;Sit to/from stand   Upper Body Dressing : Set up;Sitting   Lower Body Dressing: Contact guard assist;Sitting/lateral leans;Sit to/from stand   Toilet Transfer: Medical sales representative Details (indicate cue type and reason): pivoting from [facility] back to bed on OT entry. no AD Toileting- Clothing Manipulation and Hygiene: Contact guard assist;Sit to/from stand;Sitting/lateral lean;Supervision/safety Toileting - Clothing Manipulation Details (indicate cue type and reason): had completed hygiene without assist prior to OT entry       General ADL Comments: Emphasis on energy conservation education (handout provided for review), IS/Flutter valve use and breathing techniques to calm anxiety. Somewhat limited by SOB, desire to finish breakfast and RT entering for breathing treatment     Vision Ability to See in Adequate Light: 0 Adequate Patient Visual Report: No change from baseline Vision Assessment?: No apparent visual deficits     Perception         Praxis  Pertinent Vitals/Pain Pain Assessment Pain Assessment: No/denies pain     Extremity/Trunk Assessment Upper Extremity Assessment Upper Extremity Assessment: Generalized weakness;Right hand dominant   Lower Extremity Assessment Lower Extremity Assessment: Defer to PT evaluation   Cervical / Trunk  Assessment Cervical / Trunk Assessment: Normal   Communication Communication Communication: No apparent difficulties Cueing Techniques: Verbal cues   Cognition Arousal: Alert Behavior During Therapy: Anxious Overall Cognitive Status: Within Functional Limits for tasks assessed                                 General Comments: anxious with SOB, cues for breathing needed     General Comments  2 L O2 per chart but on entry, pt on 5 L O2 - discussed with RN. SpO2 97%+ though 3/4 DOE noted    Exercises     Shoulder Instructions      Home Living Family/patient expects to be discharged to:: Private residence Living Arrangements: Alone Available Help at Discharge: Family;Available PRN/intermittently Type of Home: Apartment Home Access: Level entry     Home Layout: One level     Bathroom Shower/Tub: Tub/shower unit;Walk-in shower         Home Equipment: Agricultural consultant (2 wheels);Shower seat   Additional Comments: daughter lives nearby      Prior Functioning/Environment Prior Level of Function : Independent/Modified Independent             Mobility Comments: no AD for mobility ADLs Comments: does not drive, daughter takes pt grocery shopping, uses [facility] for appts. Indep with ADLs        OT Problem List: Decreased strength;Decreased activity tolerance;Impaired balance (sitting and/or standing);Decreased knowledge of use of DME or AE;Cardiopulmonary status limiting activity;Decreased safety awareness      OT Treatment/Interventions: Self-care/ADL training;Therapeutic exercise;Energy conservation;DME and/or AE instruction;Therapeutic activities;Patient/family education;Balance training    OT Goals(Current goals can be found in the care plan section) Acute Rehab OT Goals Patient Stated Goal: improve breathing OT Goal Formulation: With patient Time For Goal Achievement: 07/01/23 Potential to Achieve Goals: Good  OT Frequency: Min 1X/week     Co-evaluation              AM-PAC OT "6 Clicks" Daily Activity     Outcome Measure Help from another person eating meals?: None Help from another person taking care of personal grooming?: A Little Help from another person toileting, which includes using toliet, bedpan, or urinal?: A Little Help from another person bathing (including washing, rinsing, drying)?: A Little Help from another person to put on and taking off regular upper body clothing?: A Little Help from another person to put on and taking off regular lower body clothing?: A Little 6 Click Score: 19   End of Session Equipment Utilized During Treatment: Oxygen Nurse Communication: Mobility status  Activity Tolerance: Other (comment) (limited by SOB/anxiety) Patient left: in bed;with call bell/phone within reach;with bed alarm set;Other (comment) (RT at bedside)  OT Visit Diagnosis: Unsteadiness on feet (R26.81);Other abnormalities of gait and mobility (R26.89)                Time: 1610-9604 OT Time Calculation (min): 12 min Charges:  OT General Charges $OT Visit: 1 Visit OT Evaluation $OT Eval Low Complexity: 1 Low  Bradd Canary, OTR/L Acute Rehab Services Office: [phone]   Lorre Munroe 06/17/2023, 8:01 AM

## 2023-06-18 DIAGNOSIS — J441 Chronic obstructive pulmonary disease with (acute) exacerbation: Secondary | ICD-10-CM | POA: Diagnosis not present

## 2023-06-18 LAB — BASIC METABOLIC PANEL
Anion gap: 10 (ref 5–15)
BUN: 24 mg/dL — ABNORMAL HIGH (ref 8–23)
CO2: 27 mmol/L (ref 22–32)
Calcium: 9.1 mg/dL (ref 8.9–10.3)
Chloride: 101 mmol/L (ref 98–111)
Creatinine, Ser: 0.87 mg/dL (ref 0.44–1.00)
GFR, Estimated: >60 mL/min (ref >60)
Glucose, Bld: 120 mg/dL — ABNORMAL HIGH (ref 70–99)
Potassium: 4.3 mmol/L (ref 3.5–5.1)
Sodium: 138 mmol/L (ref 135–145)

## 2023-06-18 LAB — CBC
HCT: 42.9 % (ref 36.0–46.0)
Hemoglobin: 14.3 g/dL (ref 12.0–15.0)
MCH: 32.1 pg (ref 26.0–34.0)
MCHC: 33.3 g/dL (ref 30.0–36.0)
MCV: 96.2 fL (ref 80.0–100.0)
Platelets: 340 10*3/uL (ref 150–400)
RBC: 4.46 MIL/uL (ref 3.87–5.11)
RDW: 14.5 % (ref 11.5–15.5)
WBC: 12.4 10*3/uL — ABNORMAL HIGH (ref 4.0–10.5)
nRBC: 0 % (ref 0.0–0.2)

## 2023-06-18 LAB — MAGNESIUM: Magnesium: 2 mg/dL (ref 1.7–2.4)

## 2023-06-18 NOTE — Progress Notes (Addendum)
HD#4 SUBJECTIVE:  Patient Summary: [name] [name] is a 67 y.o. with a pertinent PMH of COPD, hypertension, prediabetes, CAD status post stents, who presented with 1 week of dyspnea and productive cough and admitted for acute hypoxic respiratory failure secondary to COPD exacerbation.    Overnight Events: None  Interim History: Patient was evaluated bedside today, she reports doing somewhat well today compared to yesterday.  Patient states that she is still feeling short of breath and wheezy.  Patient denies any abdominal pain, nausea, vomiting.  Patient states that she reports dizzy and lightheaded yesterday when she got up and tried to ambulate.  She is hoping to try to wean down from oxygen and ambulate today.  No other concerns at this time.  OBJECTIVE:  Vital Signs: Vitals:   06/18/23 0433 06/18/23 0740 06/18/23 1033 06/18/23 1147  BP:  (!) 146/85  137/80  Pulse: (!) 55 66 68 65  Resp:  20    Temp:   97.9 F (36.6 C) (!) 97.4 F (36.3 C)  TempSrc:   Oral Oral  SpO2: 97% 98% 91% 93%  Weight:      Height:       Supplemental O2: Nasal Cannula SpO2: 93 % O2 Flow Rate (L/min): 4 L/min FiO2 (%): 30 %  Filed Weights   06/16/23 0555 06/17/23 0326 06/18/23 0320  Weight: 67.5 kg 73 kg 72.5 kg    No intake or output data in the 24 hours ending 06/18/23 1501 Net IO Since Admission: 429.8 mL [06/18/23 1501]  Physical Exam: Physical Exam  General: Appears ill, no acute distress Cardiovascular: Regular rate, regular rhythm, no murmurs or rubs or gallops Pulmonary: Diffuse bilateral wheeze, improved. Good airflow throughout her lungs.  Abdomen: Soft, nontender, nondistended, bowel sounds present MSK: Range of motion intact Neuro: Alert, no focal deficits   Patient Lines/Drains/Airways Status     Active Line/Drains/Airways     Name Placement date Placement time Site Days   Peripheral IV 06/12/23 22 G Anterior;Right Forearm 06/12/23  1316  Forearm  6              ASSESSMENT/PLAN:  Assessment: Principal Problem:   Acute exacerbation of chronic obstructive pulmonary disease (COPD) (HCC) Active Problems:   COPD exacerbation (HCC)   Acute hypoxic respiratory failure (HCC)   Aspiration into airway   COPD with acute exacerbation (HCC)   Full code status  [name] [name] is a 67 y.o. person living with a history of COPD, HFpEF, CAD status post stents, prediabetes who presented with shortness of breath and productive cough and admitted for acute hypoxic respiratory failure secondary to acute COPD exacerbation.   Plan:   #Acute hypoxic respiratory failure #Acute COPD exacerbation #Leukocytosis Presented with several days of progressive dyspnea and productive cough for 1 week, found to be hypoxic in ED, placed on 2L nasal cannula. Elevated WBC 13.2. BNP mildly elevated to 106.4. Respiratory panel negative for COVID, flu, RSV. Patient was transferred to ICU on 11/29-11/30 for progressive shortness of breath and increased oxygen requirements. Patient was placed on BiPAP overnight, received IV magnesium, IV Solu-Medrol 60mg , with addition of Rocephin to azithromycin. Extended Respiratory panel positive for parainfluenza virus 4.  Expectorant culture showed rare gram-positive cocci, with culture showing few normal respiratory flora, no Staph aureus or Pseudomonas.  Patient finished her IV Rocephin antibiotic course,    Per evaluation today, afebrile, on 3 L of oxygen via nasal cannula saturating above 96%.  She is still wheezing per lung  auscultation, though improved compared to yesterday. Encouraged mobility and wean oxygen today. PT evaluated patient and had noted decrease in oxygen saturation at about 84% while ambulating, on 4 L she was saturating at about 94%. We will continue working with to wean down her oxygen.   Plan:  -Prednisone 40 mg for 3/5 days;  - Followed by prednisone 20 mg for 5 days for steroid taper -Continue Brovana twice daily and  Yuperlri daily  -Continue Proventil nebulization every 2 hours as needed for wheezing and shortness of breath -Continue Pulmicort nebulization twice daily -Continue DuoNebs every 4 hours for wheezing and shortness of breath -Continue Mucinex 600 mg twice daily for congestion -Added Tessalon capsules for cough  -Continue Singulair 10 mg daily at bedtime -PT evaluated and recommended home health PT. -CONTINUE TO WEAN OXYGEN AS PATIENT TOLERATES.    #HTN Continue home medications losartan 25 mg daily, amlodipine 5 mg daily. IV hydralazine 10 mg every 4 as needed if SBP > 160    Chronic conditions: CAD s/p stent ( 2018): Continue home medication Brilinta 60 mg twice daily, Lipitor 80 mg daily, nebivolol 10 mg  HFpEF: Continue Lasix 20 mg daily Hypothyroidism: Continue Synthroid 112 mcg daily Anxiety/Depression: Continue home medication Klonopin 0.5 mg nightly, doxepin 75 mg daily, Lexapro 20 mg daily, BuSpar 30 mg AM at 0800, 15 mg 1200, 15 mg 1800, mirtazapine 45 mg daily Prediabetes: Hold metformin 500 mg.  Best Practice: Diet: Dysphagia type III diet IVF: Fluids: None, Rate: None VTE: enoxaparin (LOVENOX) injection 40 mg Start: 06/13/23 2000 Code: Full AB: None  Therapy Recs: Home Health, DME: other none DISPO: Anticipated discharge tomorrow to Home pending New oxygen.  Signature: Jeral Pinch, D.O.  Internal Medicine Resident, PGY-1 [name]  Pager: [phone] 3:01 PM, 06/18/2023   Please contact the on call pager after 5 pm and on weekends at [phone].

## 2023-06-18 NOTE — Progress Notes (Signed)
Mobility Specialist Progress Note:   06/18/23 1305  Mobility  Activity Transferred to/from [facility]  Level of Assistance Contact guard assist, steadying assist  Assistive Device None  Distance Ambulated (ft) 3 ft  Activity Response Tolerated well  Mobility Referral Yes  $Mobility charge 1 Mobility  Mobility Specialist Start Time (ACUTE ONLY) 1300  Mobility Specialist Stop Time (ACUTE ONLY) 1307  Mobility Specialist Time Calculation (min) (ACUTE ONLY) 7 min   Pt received in chair, requesting assistance to BR. CG to stand and pivot to [facility]. Void successful. No assistance needed for pericare. Asx throughout. Pt returned to chair with call bell in reach and all needs met.   Leory Plowman  Mobility Specialist Please contact via [employer] office at [phone]

## 2023-06-18 NOTE — Progress Notes (Signed)
SATURATION QUALIFICATIONS: (This note is used to comply with regulatory documentation for home oxygen)  Patient Saturations on Room Air at Rest = 91%  Patient Saturations on Room Air while Ambulating = 84%  Patient Saturations on 4 Liters of oxygen while Ambulating = 94%  Please briefly explain why patient needs home oxygen: due to desaturation with ambulation on room air.   Harrel Carina, DPT, CLT  Acute Rehabilitation Services Office: [phone] (Secure chat preferred)

## 2023-06-18 NOTE — Progress Notes (Signed)
Physical Therapy Treatment Patient Details Name: [name] [name] MRN: [id number] DOB: 1955-08-25 Today's Date: 06/18/2023   History of Present Illness Pt is 67 yo female who presents on 06/12/23 with SOB. Acute COPD exacerbation. Aspiration even 11/29 with need for Bipap. PMH:  HTN, CAD with multiple DES, DM2, COPD, anxiety, hypothyroidism, RTC repair 12/24/2021, ongoing tobacco abuse.    PT Comments  Pt is progressing well towards goals. O2 walk test performed this session at Min A due to knees buckling during gait on 3L O2 via [place]; pt requires 4L O2 to ambulate with O2 sats over 88%. Pt is supervision for safety with sti to stand and Mod I for bed mobility. Due to pt current functional status, home set up and available assistance at home recommending skilled physical therapy services 3x/weekly on discharge from acute care hospital setting in order to address activity tolerance, strength and mobility to decrease risk for falls, immobility, injuries and re-hospitalization. Pt was limited by respiratory status and fatigue this session.     If plan is discharge home, recommend the following: A little help with walking and/or transfers;A little help with bathing/dressing/bathroom;Assistance with cooking/housework;Assist for transportation     Equipment Recommendations  Rollator (4 wheels)       Precautions / Restrictions Precautions Precautions: Fall;Other (comment) Precaution Comments: monitor SOB and O2 Restrictions Weight Bearing Restrictions: No Other Position/Activity Restrictions: not on home O2 PTA     Mobility  Bed Mobility Overal bed mobility: Modified Independent             General bed mobility comments: sitting EOB with increased time but no physical assist    Transfers Overall transfer level: Needs assistance Equipment used: Rolling walker (2 wheels) Transfers: Sit to/from Stand Sit to Stand: Supervision      Ambulation/Gait Ambulation/Gait assistance: Min assist,  Contact guard assist Gait Distance (Feet): 50 Feet (+25) Assistive device: Rolling walker (2 wheels) Gait Pattern/deviations: Step-through pattern, Decreased stride length, Knees buckling Gait velocity: decreased Gait velocity interpretation: <1.31 ft/sec, indicative of household ambulator   General Gait Details: Pt was ambulating in hall doing O2 walk test as requested. Pt O2 sats 91% on room air in resting, with gait down to 87% then 84% pt on 1L O2 and remained at 84% 2L pt remained 84% pt on 3L increased to 87%, 4L Pt at 94% O2 sat with gait. Pt knees began buckling and required Min A to prevent fall. Pt had to take s eated rest break with quick improvement in O2 sats then ambulated 25 ft at [facility] pt was limited by fatigue and respiratory status.      Balance Overall balance assessment: Mild deficits observed, not formally tested        Cognition Arousal: Alert Behavior During Therapy: WFL for tasks assessed/performed Overall Cognitive Status: Within Functional Limits for tasks assessed         General Comments General comments (skin integrity, edema, etc.): Walk test up to 4L o2 during gait. End of session pt sitting in recliner at 94% O2 sat on 1L O2 via [place]      Pertinent Vitals/Pain Pain Assessment Pain Assessment: No/denies pain     PT Goals (current goals can now be found in the care plan section) Acute Rehab PT Goals Patient Stated Goal: return home PT Goal Formulation: With patient Time For Goal Achievement: 06/30/23 Potential to Achieve Goals: Good Progress towards PT goals: Progressing toward goals    Frequency    Min 1X/week  PT Plan  Continue with current POC       AM-PAC PT "6 Clicks" Mobility   Outcome Measure  Help needed turning from your back to your side while in a flat bed without using bedrails?: None Help needed moving from lying on your back to sitting on the side of a flat bed without using bedrails?: None Help needed moving to and  from a bed to a chair (including a wheelchair)?: A Little Help needed standing up from a chair using your arms (e.g., wheelchair or bedside chair)?: A Little Help needed to walk in hospital room?: A Little Help needed climbing 3-5 steps with a railing? : A Lot 6 Click Score: 19    End of Session Equipment Utilized During Treatment: Oxygen;Gait belt Activity Tolerance: Patient limited by fatigue;Patient tolerated treatment well Patient left: in chair;with call bell/phone within reach;with chair alarm set Nurse Communication: Mobility status PT Visit Diagnosis: Muscle weakness (generalized) (M62.81);Pain;Difficulty in walking, not elsewhere classified (R26.2)     Time: 1610-9604 PT Time Calculation (min) (ACUTE ONLY): 25 min  Charges:    $Therapeutic Activity: 23-37 mins PT General Charges $$ ACUTE PT VISIT: 1 Visit                    Harrel Carina, DPT, CLT  Acute Rehabilitation Services Office: [phone] (Secure chat preferred)  Claudia Desanctis 06/18/2023, 1:22 PM

## 2023-06-19 DIAGNOSIS — J441 Chronic obstructive pulmonary disease with (acute) exacerbation: Secondary | ICD-10-CM | POA: Diagnosis not present

## 2023-06-19 DIAGNOSIS — J9601 Acute respiratory failure with hypoxia: Secondary | ICD-10-CM | POA: Diagnosis not present

## 2023-06-19 LAB — CBC
HCT: 43.7 % (ref 36.0–46.0)
Hemoglobin: 14.3 g/dL (ref 12.0–15.0)
MCH: 32.2 pg (ref 26.0–34.0)
MCHC: 32.7 g/dL (ref 30.0–36.0)
MCV: 98.4 fL (ref 80.0–100.0)
Platelets: 482 10*3/uL — ABNORMAL HIGH (ref 150–400)
RBC: 4.44 MIL/uL (ref 3.87–5.11)
RDW: 14.5 % (ref 11.5–15.5)
WBC: 10.2 10*3/uL (ref 4.0–10.5)
nRBC: 0 % (ref 0.0–0.2)

## 2023-06-19 LAB — BASIC METABOLIC PANEL
Anion gap: 9 (ref 5–15)
BUN: 25 mg/dL — ABNORMAL HIGH (ref 8–23)
CO2: 35 mmol/L — ABNORMAL HIGH (ref 22–32)
Calcium: 9.6 mg/dL (ref 8.9–10.3)
Chloride: 97 mmol/L — ABNORMAL LOW (ref 98–111)
Creatinine, Ser: 0.95 mg/dL (ref 0.44–1.00)
GFR, Estimated: >60 mL/min (ref >60)
Glucose, Bld: 117 mg/dL — ABNORMAL HIGH (ref 70–99)
Potassium: 4.5 mmol/L (ref 3.5–5.1)
Sodium: 141 mmol/L (ref 135–145)

## 2023-06-19 NOTE — Plan of Care (Signed)

## 2023-06-19 NOTE — Progress Notes (Signed)
HD#5 SUBJECTIVE:  Patient Summary:  [name] [name] is a 67 y.o. with a pertinent PMH of COPD, hypertension, prediabetes, CAD status post stents, who presented with 1 week of dyspnea and productive cough and admitted for acute hypoxic respiratory failure secondary to COPD exacerbation.    Overnight Events: None  Interim History: Patient evaluated at bedside this morning.  Patient reports that she slept well last night.  States that she ambulated yesterday halfway through the hallway. States that she wants to try to wean down from the nasal cannula. Otherwise denies any CP, nausea/ vomiting/ abdominal pain.   OBJECTIVE:  Vital Signs: Vitals:   06/19/23 0336 06/19/23 0525 06/19/23 0856 06/19/23 1248  BP: 138/72  119/71 132/83  Pulse: 69  83 64  Resp:   20 (!) 21  Temp: 98.6 F (37 C)  97.6 F (36.4 C) 97.7 F (36.5 C)  TempSrc: Oral  Oral Other (Comment)  SpO2: 100%  99%   Weight:  72.1 kg    Height:       Supplemental O2: Nasal Cannula SpO2: 99 % O2 Flow Rate (L/min): 2 L/min FiO2 (%): 40 %  Filed Weights   06/17/23 0326 06/18/23 0320 06/19/23 0525  Weight: 73 kg 72.5 kg 72.1 kg    No intake or output data in the 24 hours ending 06/19/23 1405 Net IO Since Admission: 429.8 mL [06/19/23 1405]  Physical Exam: Physical Exam  General: Patient is resting comfortably in bed in no acute distress  Cardio: Regular rate and rhythm, no murmurs, rubs or gallops.   Pulmonary: Normal work of breathing, +wheeze bilaterally, improved compared to yesterday.  Abdomen: Soft, nontender with normoactive bowel sounds with no rebound or guarding   Neuro: Alert and oriented x3, no focal deficits   Patient Lines/Drains/Airways Status     Active Line/Drains/Airways     Name Placement date Placement time Site Days   Peripheral IV 06/12/23 22 G Anterior;Right Forearm 06/12/23  1316  Forearm  7             ASSESSMENT/PLAN:  Assessment: Principal Problem:   Acute exacerbation of  chronic obstructive pulmonary disease (COPD) (HCC) Active Problems:   COPD exacerbation (HCC)   Acute hypoxic respiratory failure (HCC)   Aspiration into airway   COPD with acute exacerbation (HCC)   Full code status  [name] [name] is a 67 y.o. person living with a history of COPD, HFpEF, CAD status post stents, prediabetes who presented with shortness of breath and productive cough and admitted for acute hypoxic respiratory failure secondary to acute COPD exacerbation.   Plan:   #Acute hypoxic respiratory failure #Acute COPD exacerbation #Leukocytosis Presented with several days of progressive dyspnea and productive cough for 1 week, found to be hypoxic in ED, placed on 2L nasal cannula. Elevated WBC 13.2. BNP mildly elevated to 106.4. Respiratory panel negative for COVID, flu, RSV. Patient was transferred to ICU on 11/29-11/30 for progressive shortness of breath and increased oxygen requirements. Patient was placed on BiPAP overnight, received IV magnesium, IV Solu-Medrol 60mg , with addition of Rocephin to azithromycin. Extended Respiratory panel positive for parainfluenza virus 4.  Expectorant culture showed rare gram-positive cocci, with culture showing few normal respiratory flora, no Staph aureus or Pseudomonas.  Patient finished her IV Rocephin antibiotic course,    Per evaluation today, afebrile, on 4L saturating at 100%.  Patient's oxygen was decreased from 4 L to 2 L, she was saturating above 96%.  Patient reports that she felt improved  compared to yesterday, she denied any worsening of her shortness of breath or chest pain.  She reported her cough has improved.  Patient was encouraged to try to wean down from oxygen to room air.  Patient was encouraged to ambulate from bed to chair, ambulate with assistance.   Plan:  -Prednisone 40 mg for 4/5 days;  - Followed by prednisone 20 mg for 5 days for steroid taper -Continue Brovana twice daily and Yuperlri daily  -Continue Proventil  nebulization every 2 hours as needed for wheezing and shortness of breath -Continue Pulmicort nebulization twice daily -Continue DuoNebs every 4 hours for wheezing and shortness of breath -Continue Mucinex 600 mg twice daily for congestion -Added Tessalon capsules for cough  -Continue Singulair 10 mg daily at bedtime -PT evaluated and recommended home health PT. -Continue to wean oxygen to room air as patient tolerates   #HTN Continue home medications losartan 25 mg daily, amlodipine 5 mg daily. IV hydralazine 10 mg every 4 as needed if SBP > 160    Chronic conditions: CAD s/p stent ( 2018): Continue home medication Brilinta 60 mg twice daily, Lipitor 80 mg daily, nebivolol 10 mg  HFpEF: Continue Lasix 20 mg daily Hypothyroidism: Continue Synthroid 112 mcg daily Anxiety/Depression: Continue home medication Klonopin 0.5 mg nightly, doxepin 75 mg daily, Lexapro 20 mg daily, BuSpar 30 mg AM at 0800, 15 mg 1200, 15 mg 1800, mirtazapine 45 mg daily Prediabetes: Hold metformin 500 mg.  Best Practice: Diet: Dysphagia 3 mechanical soft diet IVF: Fluids: None, Rate: None VTE: enoxaparin (LOVENOX) injection 40 mg Start: 06/13/23 2000 Code: Full AB: None Therapy Recs: Home Health, DME: none Family Contact: daughter, unable to be notified. Called, did not pick up DISPO: Anticipated discharge tomorrow to Home pending New oxygen.  Signature: Jeral Pinch, D.O.  Internal Medicine Resident, PGY-1 [name]  Pager: [phone] 2:05 PM, 06/19/2023   Please contact the on call pager after 5 pm and on weekends at [phone].

## 2023-06-19 NOTE — Progress Notes (Signed)
Speech Language Pathology Treatment: Dysphagia  Patient Details Name: [name] [name] MRN: [id number] DOB: 02/27/1956 Today's Date: 06/19/2023 Time: 7829-5621 SLP Time Calculation (min) (ACUTE ONLY): 19 min  Assessment / Plan / Recommendation Clinical Impression  Pt seen for ongoing dysphagia management. RN with no negative reports with POs.  Pt feels she is having less difficulty swallowing as well. Pt reports she feels very worn out today and with increased work of breathing in bed.  Today pt demonstrated ability to complete swallow exercises as noted below.  She felt too exhausted to do complete sets but did execute each exercise independently.  Pt completed EMST for swallowing and cough strength with trainer set at 1.5 turns which was 1/4 turn back from highest pressure at which she could trigger device.  Pt completed 1 set of 5 with evidence of fatigue across trials.  Provided education verbally and with written handout on respiratory issues and swallow and reinforced precautions.  Pt does admit to eating too quickly at times. Pt would benefit from ongoing ST at next level of care HH/OP/SNF to address dysphagia.   Recommend continuing mechanical soft solids with thin liquids.     HPI HPI: Patient is a 67 y.o. female with PMH: COPD, current tobacco use, CAD. She presented to the hospital on 06/12/23 with SOB/acute exacerbation of COPD. She recently was treated for this by her PCP eralier this month. On 11/29 she worsened over the course of the day and had an aspiration event resulting in PCCM consult and overnight need for BiPAP. She was transitioned off BiPAP by morning of 11/30. She was made NPO and SLP swallow evaluation ordered. She was evaluated by SLP for swallowing function in 2021 at [facility] and given recommendations to follow reflux precautions, take her time eating/drinking, take small bites and sips.      SLP Plan  Continue with current plan of care       Recommendations for follow up therapy are one component of a multi-disciplinary discharge planning process, led by the attending physician.  Recommendations may be updated based on patient status, additional functional criteria and insurance authorization.    Recommendations  Diet recommendations: Dysphagia 3 (mechanical soft);Thin liquid Liquids provided via: Cup;Straw Medication Administration: Whole meds with puree Supervision: Patient able to self feed Compensations: Slow rate;Small sips/bites Postural Changes and/or Swallow Maneuvers: Seated upright 90 degrees                  Oral care BID     Dysphagia, oropharyngeal phase (R13.12);Dysphagia, pharyngoesophageal phase (R13.14)     Continue with current plan of care     Kerrie Pleasure, MA, CCC-SLP Acute Rehabilitation Services Office: [phone] 06/19/2023, 11:03 AM

## 2023-06-19 NOTE — Progress Notes (Signed)
Mobility Specialist Progress Note:   06/19/23 1119  Mobility  Activity Ambulated with assistance in room  Level of Assistance  (MinG)  Assistive Device None  Distance Ambulated (ft) 30 ft  Activity Response Tolerated well  Mobility Referral Yes  Mobility visit 1 Mobility  Mobility Specialist Start Time (ACUTE ONLY) 1055  Mobility Specialist Stop Time (ACUTE ONLY) 1110  Mobility Specialist Time Calculation (min) (ACUTE ONLY) 15 min   Pre Mobility:93% SpO2 2 L During Mobility: 76 HR , 90% SpO2 2 L Post Mobility: 91% SpO2 2 L  Pt received in bed, agreeable to mobility. C/o feeling SOB prior to ambulation. VSS on 2 L. Pursed lip breathing encouraged. Pt able to ambulate to door with minG. Requesting seated rest break d/t fatigue. Pt denied any other discomfort. Pt left in chair with call bell in reach and all needs met. RN notified.   Leory Plowman  Mobility Specialist Please contact via [employer] office at [phone]

## 2023-06-20 ENCOUNTER — Other Ambulatory Visit (HOSPITAL_COMMUNITY): Payer: Self-pay

## 2023-06-20 ENCOUNTER — Encounter: Payer: Self-pay | Admitting: Hematology

## 2023-06-20 DIAGNOSIS — J9601 Acute respiratory failure with hypoxia: Secondary | ICD-10-CM | POA: Diagnosis not present

## 2023-06-20 DIAGNOSIS — J441 Chronic obstructive pulmonary disease with (acute) exacerbation: Secondary | ICD-10-CM | POA: Diagnosis not present

## 2023-06-20 DIAGNOSIS — B348 Other viral infections of unspecified site: Secondary | ICD-10-CM

## 2023-06-20 LAB — BASIC METABOLIC PANEL
Anion gap: 8 (ref 5–15)
BUN: 20 mg/dL (ref 8–23)
CO2: 32 mmol/L (ref 22–32)
Calcium: 9.2 mg/dL (ref 8.9–10.3)
Chloride: 98 mmol/L (ref 98–111)
Creatinine, Ser: 0.88 mg/dL (ref 0.44–1.00)
GFR, Estimated: >60 mL/min (ref >60)
Glucose, Bld: 106 mg/dL — ABNORMAL HIGH (ref 70–99)
Potassium: 4.4 mmol/L (ref 3.5–5.1)
Sodium: 138 mmol/L (ref 135–145)

## 2023-06-20 LAB — MAGNESIUM: Magnesium: 2 mg/dL (ref 1.7–2.4)

## 2023-06-20 MED ORDER — NICOTINE 14 MG/24HR TD PT24
14.0000 mg | MEDICATED_PATCH | Freq: Every day | TRANSDERMAL | 0 refills | Status: DC
  Filled 2023-06-20: qty 28, 28d supply, fill #0

## 2023-06-20 MED ORDER — PREDNISONE 20 MG PO TABS
20.0000 mg | ORAL_TABLET | Freq: Every day | ORAL | 0 refills | Status: DC
  Filled 2023-06-20: qty 5, 5d supply, fill #0

## 2023-06-20 MED ORDER — GUAIFENESIN ER 600 MG PO TB12
600.0000 mg | ORAL_TABLET | Freq: Two times a day (BID) | ORAL | 0 refills | Status: AC
  Filled 2023-06-20: qty 28, 14d supply, fill #0

## 2023-06-20 MED ORDER — BENZONATATE 100 MG PO CAPS
100.0000 mg | ORAL_CAPSULE | Freq: Two times a day (BID) | ORAL | 0 refills | Status: AC | PRN
  Filled 2023-06-20: qty 14, 7d supply, fill #0

## 2023-06-20 MED ORDER — IPRATROPIUM-ALBUTEROL 0.5-2.5 (3) MG/3ML IN SOLN
3.0000 mL | Freq: Three times a day (TID) | RESPIRATORY_TRACT | 0 refills | Status: DC
  Filled 2023-06-20: qty 180, 20d supply, fill #0

## 2023-06-20 MED ORDER — IPRATROPIUM-ALBUTEROL 0.5-2.5 (3) MG/3ML IN SOLN
3.0000 mL | Freq: Three times a day (TID) | RESPIRATORY_TRACT | Status: DC
  Administered 2023-06-20: 3 mL via RESPIRATORY_TRACT
  Filled 2023-06-20: qty 3

## 2023-06-20 NOTE — Progress Notes (Signed)
Occupational Therapy Treatment Patient Details Name: [name] [name] MRN: [id number] DOB: [birth year] Today's Date: 06/20/2023   History of present illness Pt is 67 yo female who presents on 06/12/23 with SOB. Acute COPD exacerbation. Aspiration even 11/29 with need for Bipap. PMH:  HTN, CAD with multiple DES, DM2, COPD, anxiety, hypothyroidism, RTC repair 12/24/2021, ongoing tobacco abuse.   OT comments  Pt making excellent progress towards OT goals. Pt able to mobilize in room using RW and manage ADLs standing at sink with no more than CGA. Reinforced energy conservation strategies, breathing techniques with activities and obtaining pulse ox to monitor O2 at home. Continue to rec HHOT at DC.  Spo2 91% and above on 2 L O2 with activity      If plan is discharge home, recommend the following:  A little help with bathing/dressing/bathroom;Assistance with cooking/housework;Assist for transportation   Equipment Recommendations  BSC/3in1    Recommendations for Other Services      Precautions / Restrictions Precautions Precautions: Fall;Other (comment) Precaution Comments: monitor SOB and O2 Restrictions Weight Bearing Restrictions: No       Mobility Bed Mobility Overal bed mobility: Modified Independent                  Transfers Overall transfer level: Needs assistance Equipment used: Rolling walker (2 wheels) Transfers: Sit to/from Stand Sit to Stand: Supervision                 Balance Overall balance assessment: Mild deficits observed, not formally tested                                         ADL either performed or assessed with clinical judgement   ADL Overall ADL's : Needs assistance/impaired     Grooming: Supervision/safety;Standing;Wash/dry face;Wash/dry hands;Oral care;Brushing hair Grooming Details (indicate cue type and reason): standing at sink > 7 min for ADLs with stable O2             Lower Body Dressing: Set up;Bed  level Lower Body Dressing Details (indicate cue type and reason): to don socks bed level             Functional mobility during ADLs: Contact guard assist;Rolling walker (2 wheels) General ADL Comments: Emphasis on breathing during activity and implementation of energy conservation at home. Encouraged pulse ox use/purchase at home to monitor O2    Extremity/Trunk Assessment Upper Extremity Assessment Upper Extremity Assessment: Generalized weakness;Right hand dominant   Lower Extremity Assessment Lower Extremity Assessment: Defer to PT evaluation        Vision   Vision Assessment?: No apparent visual deficits   Perception     Praxis      Cognition Arousal: Alert Behavior During Therapy: WFL for tasks assessed/performed Overall Cognitive Status: Within Functional Limits for tasks assessed                                          Exercises      Shoulder Instructions       General Comments      Pertinent Vitals/ Pain       Pain Assessment Pain Assessment: No/denies pain  Home Living  Prior Functioning/Environment              Frequency  Min 1X/week        Progress Toward Goals  OT Goals(current goals can now be found in the care plan section)  Progress towards OT goals: Progressing toward goals  Acute Rehab OT Goals Patient Stated Goal: improve breathing enough to return home and not worry about readmission OT Goal Formulation: With patient Time For Goal Achievement: 07/01/23 Potential to Achieve Goals: Good ADL Goals Pt Will Perform Lower Body Dressing: with modified independence;sit to/from stand;sitting/lateral leans Pt Will Transfer to Toilet: with modified independence;ambulating Pt/caregiver will Perform Home Exercise Program: Increased strength;Both right and left upper extremity;With theraband;Independently;With written HEP provided Additional ADL Goal #1: Pt  to verbalize at least 3 fall prevention strategies to implement at home Additional ADL Goal #2: Pt to verbalize at least 3 energy conservation strategies to implement at home  Plan      Co-evaluation                 AM-PAC OT "6 Clicks" Daily Activity     Outcome Measure   Help from another person eating meals?: None Help from another person taking care of personal grooming?: A Little Help from another person toileting, which includes using toliet, bedpan, or urinal?: A Little Help from another person bathing (including washing, rinsing, drying)?: A Little Help from another person to put on and taking off regular upper body clothing?: A Little Help from another person to put on and taking off regular lower body clothing?: A Little 6 Click Score: 19    End of Session Equipment Utilized During Treatment: Rolling walker (2 wheels);Gait belt;Oxygen  OT Visit Diagnosis: Unsteadiness on feet (R26.81);Other abnormalities of gait and mobility (R26.89)   Activity Tolerance Patient tolerated treatment well   Patient Left in chair;with call bell/phone within reach;Other (comment) (with RT; placed BSC beside recliner w/RN aware. pt has been going to [facility] without assist)   Nurse Communication Mobility status        Time: 1610-9604 OT Time Calculation (min): 31 min  Charges: OT Treatments $Self Care/Home Management : 23-37 mins  [name] [name], OTR/L Acute Rehab Services Office: [phone]   Lorre Munroe 06/20/2023, 9:03 AM

## 2023-06-20 NOTE — Discharge Instructions (Addendum)
[name] [name],   You came to the hospital for worsening SOB and were found to have parainfluenza virus with addition to your COPD flare up. We treated you with antibiotics and breathing treatments. We worked with you to get you down from the oxygen. It will take some time for you to get back to your usually self, I advise for you to take it slow and rest.   For your COPD: -Please continue your Trelegy Ellipta inhaler, 1 puff into the lungs daily -Singulair 10 mg take 1 tablet by mouth daily -Prednisone 20 mg, take 1 tablet by mouth for 5 days, Starting tomorrow.  -Continue breathing treatments through the nebulizer every 4-6 hours as needed for shortness of breath and wheezing -I am sending you with a couple of nebulizer solutions that she can take at home. -I am also sending you with Mucinex 600 mg tablets, you can take 1 tablet by mouth twice daily as needed to help with chest congestion congestion -I am also sending you with some Tessalon Perles, you can take 1 tablet as needed for cough -Please take it slow with activity, when you get short of breath, sit down and take couple of deep breaths. -Please make sure that you eat plenty of food and keep yourself hydrated.   I am sending you home with 2 L of oxygen, please use it as you needed, please do not wear the oxygen all the time.  I advised to use the oxygen in the next couple of weeks as needed, after couple of weeks please do not use the oxygen.  I do not wish for you to be dependent on oxygen if not needed.  Goal Oxygen 88-92%  For your blood pressure -Continue losartan 25 mg and amlodipine 5 mg daily  Continue the rest of your medications as prescribed.  No other changes were made.  If you have any of these following symptoms, please call us or seek care at an emergency department: -Chest Pain -Difficulty Breathing -Worsening abdominal pain -Syncope (passing out) -Drooping of face -Slurred speech -Sudden weakness in your leg or  arm -Fever -Chills -blood in the stool -dark black, sticky stool  If you have any questions or concerns, call our clinic at [phone] or after hours call [phone] and ask for the internal medicine resident on call.  I am glad you are feeling better. It was a pleasure taking care for you. I wish a good recovery and good health!   Happy Holidays!   Dr. Jeral Pinch

## 2023-06-20 NOTE — Plan of Care (Signed)

## 2023-06-20 NOTE — TOC CM/SW Note (Signed)
Patient has crutches at home however patient with increased fatigue with mobility and rollator would provide a safer assist device to decrease risk of falls and injury.

## 2023-06-20 NOTE — Discharge Summary (Signed)
Name: [name] [name] MRN: [id number] DOB: 1956-06-25 67 y.o. PCP: [name], MD  Date of Admission: 06/12/2023  7:37 AM Date of Discharge:  06/20/2023  Attending Physician: Dr. Cleda Daub  DISCHARGE DIAGNOSIS:  Primary Problem: Acute exacerbation of chronic obstructive pulmonary disease (COPD) [facility])   Hospital Problems: Principal Problem:   Acute exacerbation of chronic obstructive pulmonary disease (COPD) (HCC) Active Problems:   COPD exacerbation (HCC)   Acute hypoxic respiratory failure (HCC)   Aspiration into airway   COPD with acute exacerbation (HCC)   Full code status    DISCHARGE MEDICATIONS:   Allergies as of 06/20/2023       Reactions   Avocado Anaphylaxis   Latex Shortness Of Breath, Rash   Codeine Nausea Only        Medication List     TAKE these medications    AeroChamber MV inhaler Use as instructed   albuterol 108 (90 Base) MCG/ACT inhaler Commonly known as: Ventolin HFA Inhale 2 puffs into the lungs every 6 (six) hours as needed for wheezing or shortness of breath.   alendronate 35 MG tablet Commonly known as: FOSAMAX Take 1 tablet (35 mg total) by mouth every 7 (seven) days. Take with a full glass of water on an empty stomach. Sit upright for 30-60 mins after taking What changed:  when to take this additional instructions   amLODipine 5 MG tablet Commonly known as: NORVASC Take 1 tablet (5 mg total) by mouth daily.   aspirin EC 81 MG tablet Take 81 mg by mouth daily.   atorvastatin 80 MG tablet Commonly known as: LIPITOR Take 1 tablet (80 mg total) by mouth daily, please schedule appointment for more refills   benzonatate 100 MG capsule Commonly known as: TESSALON Take 1 capsule (100 mg total) by mouth 2 (two) times daily as needed for up to 7 days for cough.   Brilinta 60 MG Tabs tablet Generic drug: ticagrelor Take 1 tablet (60 mg total) by mouth 2 (two) times daily.   busPIRone 15 MG tablet Commonly known as:  BUSPAR Take 2 tablets (30 mg total) by mouth in the morning AND 1 tablet (15 mg total) daily at 12 noon AND 1 tablet (15 mg total) every evening.   clonazePAM 0.5 MG tablet Commonly known as: KlonoPIN Take 1 tablet (0.5 mg total) by mouth 2 (two) times daily as needed for anxiety. What changed:  when to take this additional instructions   doxepin 75 MG capsule Commonly known as: SINEQUAN Take 1 capsule (75 mg total) by mouth at bedtime.   escitalopram 20 MG tablet Commonly known as: LEXAPRO Take 1 tablet (20 mg total) by mouth daily.   furosemide 20 MG tablet Commonly known as: LASIX Take 1 tablet (20 mg total) by mouth daily.   gabapentin 600 MG tablet Commonly known as: NEURONTIN TAKE 1 TABLET BY MOUTH 4 TIMES DAILY   glucose blood test strip Commonly known as: True Metrix Blood Glucose Test Use as instructed   guaiFENesin 600 MG 12 hr tablet Commonly known as: MUCINEX Take 1 tablet (600 mg total) by mouth 2 (two) times daily for 14 days.   ipratropium-albuterol 0.5-2.5 (3) MG/3ML Soln Commonly known as: DUONEB Take 3 mLs by nebulization 3 (three) times daily for 14 days.   ketorolac 0.5 % ophthalmic solution Commonly known as: ACULAR Place 1 drop into the left eye 4 (four) times daily.   levothyroxine 112 MCG tablet Commonly known as: SYNTHROID Take 1 tablet (  112 mcg total) by mouth daily.   losartan 50 MG tablet Commonly known as: COZAAR Take 1.5 tablets (75 mg total) by mouth daily.   metFORMIN 500 MG tablet Commonly known as: GLUCOPHAGE Take 1 tablet (500 mg total) by mouth 2 (two) times daily with a meal. What changed: when to take this   mirtazapine 45 MG tablet Commonly known as: REMERON Take 1 tablet (45 mg total) by mouth at bedtime.   montelukast 10 MG tablet Commonly known as: SINGULAIR Take 1 tablet (10 mg total) by mouth at bedtime.   nebivolol 10 MG tablet Commonly known as: Bystolic Take 1 tablet (10 mg total) by mouth daily.    nicotine 14 mg/24hr patch Commonly known as: NICODERM CQ - dosed in mg/24 hours Place 1 patch (14 mg total) onto the skin daily. Start taking on: June 21, 2023   nitroGLYCERIN 0.4 MG SL tablet Commonly known as: NITROSTAT Place 1 tablet (0.4 mg total) under the tongue every 5 (five) minutes as needed for chest pain.   ofloxacin 0.3 % ophthalmic solution Commonly known as: OCUFLOX Place 1 drop into the left eye 4 (four) times daily.   omeprazole 40 MG capsule Commonly known as: PRILOSEC Take 1 capsule (40 mg total) by mouth 2 (two) times daily before a meal.   prednisoLONE acetate 1 % ophthalmic suspension Commonly known as: PRED FORTE Place 1 drop into the left eye 4 (four) times daily.   predniSONE 20 MG tablet Commonly known as: DELTASONE Take 1 tablet (20 mg total) by mouth daily with breakfast for 5 days. Start taking on: June 21, 2023   Trelegy Ellipta 100-62.5-25 MCG/ACT Aepb Generic drug: Fluticasone-Umeclidin-Vilant INHALE 1 PUFF into THE lungs DAILY   triamcinolone 0.025 % ointment Commonly known as: KENALOG Apply 1 Application topically 2 (two) times daily as needed.   True Metrix Meter Devi 1 kit by Does not apply route 4 (four) times daily.   TRUEplus Lancets 28G Misc 28 g by Does not apply route QID.               Durable Medical Equipment  (From admission, onward)           Start     Ordered   06/20/23 1452  For home use only DME 4 wheeled rolling walker with seat  Once       Question:  Patient needs a walker to treat with the following condition  Answer:  Physical deconditioning   06/20/23 1452   06/20/23 1214  For home use only DME Bedside commode  Once       Question:  Patient needs a bedside commode to treat with the following condition  Answer:  Physical deconditioning   06/20/23 1214   06/20/23 1213  For home use only DME oxygen  Once       Question Answer Comment  Length of Need 6 Months   Mode or (Route) Nasal cannula    Liters per Minute 2   Frequency Continuous (stationary and portable oxygen unit needed)   Oxygen delivery system Gas      06/20/23 1213            DISPOSITION AND FOLLOW-UP:  [name] [name] was discharged from [facility] in Stable condition. At the hospital follow up visit please address:  AHRF/AECOPD: Patient is discharged with prednisone 20 mg daily for 5 days.  She is advised to continue using her Trelegy inhaler.  She is also sent home  with DuoNebs, breathing treatments that she can use in case of shortness of breath and wheezing.  She is also discharged with 2 weeks of Mucinex 600 mg twice daily and a week of Tessalon Perles 100 mg for cough. Oxygen: She is discharged with as needed 2 L oxygen as needed when she gets short of breath.  Patient is encouraged to only wear it when she feels short of breath or her oxygen saturation drops less than 88%.  Daughter is also notified that only use it as needed.  That she should not depend on this oxygen.  Please evaluate the need for this patient oxygen status, I highly recommend that she should not stay on oxygen if not needed. She has a follow-up appointment with a pulmonologist. Orders for home health PT/OT were placed.   Otherwise no other changes were made to her home medications.   Follow-up Recommendations: Consults: None Labs: Basic Metabolic Profile and CBC Studies: None Medications: Tessalon Perles 100 mg twice daily for 7 days as needed, Mucinex 600 mg twice daily for 14 days as needed, DuoNeb solution 3 times daily as needed for 14 days, nicotine patch, prednisone 20 mg daily for 5 days.  No other changes were made to medications, she can continue taking medications as prescribed.   Follow-up Appointments:  Follow-up Information     [name], MD. Schedule an appointment as soon as possible for a visit.   Specialty: Internal Medicine Why: Schedule hospital follow-up appt within 1-2  weeks. Contact information: [address] [place] [address] [phone]         Martina Sinner, MD. Go on 08/07/2023.   Specialty: Pulmonary Disease Why: Appointment scheduled for: 08/07/2023 11:00 AM Contact information: [address] [place] [address] [phone]         [facility], [facility] Follow up.   Specialty: Home Health Services Why: HHPT/OT arranged- they will contact you to schedule Contact information: [address] [place] [address] [phone]         [facility] Follow up.   Why: (Adapt)- home 02 and rollator arranged- DME-rollator and portable 02 will be delivered to room for transport home- they will contact you regarding delivery of home 02 equipment to your daughter's home Contact information: [address] [facility] [place] [address] [phone]                 HOSPITAL COURSE:  Patient Summary: [name] [name] is a 67 y.o. person living with a history of COPD, HFpEF, CAD status post stents, prediabetes who presented with shortness of breath and productive cough and admitted for acute hypoxic respiratory failure secondary to acute COPD exacerbation.   Plan:   #Acute hypoxic respiratory failure #Acute COPD exacerbation #Positive Parainfluenzae  #Leukocytosis Presented with several days of progressive dyspnea and productive cough for 1 week, found to be hypoxic in ED, placed on 2L nasal cannula. Elevated WBC 13.2. BNP mildly elevated to 106.4. Respiratory panel negative for COVID, flu, RSV. Patient was transferred to ICU on 11/29-11/30 for progressive shortness of breath and increased oxygen requirements. Patient was placed on BiPAP overnight, received IV magnesium, IV Solu-Medrol 60mg , with addition of Rocephin to azithromycin. Extended Respiratory panel positive for parainfluenza virus 4.  Expectorant culture showed rare gram-positive cocci, with culture showing few  normal respiratory flora, no Staph aureus or Pseudomonas.  Patient finished her IV Rocephin antibiotic course and Zithromax course.  Over  the course of the inpatient, patient was given multiple breathing treatments that consisted of Brovana twice daily, Yuperlri daily, Proventil nebulization every 2 hours as needed for wheezing and shortness of breath, pulmonary code nebulization twice daily, DuoNebs every 4 hours, Mucinex 600 mg twice daily for congestion, Tessalon capsules for cough, Singulair 10 mg daily at bedtime.  PT OT has been working with her to help her wean her down from oxygen.  Patient is doing overall very well over the course of couple of days, she has been slowly been weaning off of oxygen, per evaluation today she was saturating on room air above 88%, with minimal drop in her O2 sats. Her breathing has improved with minimal wheezing that is apparent. PT evaluated the patient, during ambulation, pt required oxygen to maintain sats above >88%.    #HTN Continue home medications losartan 25 mg daily, amlodipine 5 mg daily. IV hydralazine 10 mg every 4 as needed if SBP > 160    Chronic conditions: CAD s/p stent ( 2018): Continue home medication Brilinta 60 mg twice daily, Lipitor 80 mg daily, nebivolol 10 mg  HFpEF: Continue Lasix 20 mg daily Hypothyroidism: Continue Synthroid 112 mcg daily Anxiety/Depression: Continue home medication Klonopin 0.5 mg nightly, doxepin 75 mg daily, Lexapro 20 mg daily, BuSpar 30 mg AM at 0800, 15 mg 1200, 15 mg 1800, mirtazapine 45 mg daily Prediabetes: Hold metformin 500 mg, can restart at home.   DISCHARGE INSTRUCTIONS:   Discharge Instructions     Diet - low sodium heart healthy   Complete by: As directed    Discharge instructions   Complete by: As directed    [name] [name],   You came to the hospital for worsening SOB and were found to have parainfluenza virus with addition to your COPD flare up. We treated you with antibiotics and breathing  treatments. We worked with you to get you down from the oxygen. It will take some time for you to get back to your usually self, I advise for you to take it slow and rest.   For your COPD: -Please continue your Trelegy Ellipta inhaler, 1 puff into the lungs daily -Singulair 10 mg take 1 tablet by mouth daily -Prednisone 20 mg, take 1 tablet by mouth for 5 days, Starting tomorrow.  -Continue breathing treatments through the nebulizer every 4-6 hours as needed for shortness of breath and wheezing -I am sending you with a couple of nebulizer solutions that she can take at home. -I am also sending you with Mucinex 600 mg tablets, you can take 1 tablet by mouth twice daily as needed to help with chest congestion congestion -I am also sending you with some Tessalon Perles, you can take 1 tablet as needed for cough -Please take it slow with activity, when you get short of breath, sit down and take couple of deep breaths. -Please make sure that you eat plenty of food and keep yourself hydrated.   I am sending you home with 2 L of oxygen, please use it as you needed, please do not wear the oxygen all the time.  I advised to use the oxygen in the next couple of weeks as needed, after couple of weeks please do not use the oxygen.  I do not wish for you to be dependent on oxygen if not needed.  Goal Oxygen 88-92%  For your blood pressure -Continue losartan 25 mg and amlodipine 5 mg daily  Continue the rest of your medications as prescribed.  No  other changes were made.  If you have any of these following symptoms, please call us or seek care at an emergency department: -Chest Pain -Difficulty Breathing -Worsening abdominal pain -Syncope (passing out) -Drooping of face -Slurred speech -Sudden weakness in your leg or arm -Fever -Chills -blood in the stool -dark black, sticky stool  If you have any questions or concerns, call our clinic at [phone] or after hours call [phone] and ask for  the internal medicine resident on call.  I am glad you are feeling better. It was a pleasure taking care for you. I wish a good recovery and good health!   Happy Holidays!   Dr. Jeral Pinch   Increase activity slowly   Complete by: As directed        SUBJECTIVE:  Patient was evaluated today, she was standing by sink without a nasal cannula. She reported that she got up and went to the bathroom, had to stop for couple of minutes by the sink a breath before going to bed. She reports felling a lot better today compared to yesterday. Cough is still present though has improved. No other concerns at this time.  Discharge Vitals:   BP 127/88 (BP Location: Right Arm)   Pulse 68   Temp (!) 97.4 F (36.3 C) (Oral)   Resp 20   Ht 4\' 10"  (1.473 m)   Wt 72.1 kg   SpO2 92%   BMI 33.22 kg/m   OBJECTIVE:  Physical Exam   General: Patient was standing by the sink, no acute distress, made her way to the bed.  Cardio: Regular rate and rhythm, no murmurs, rubs or gallops.   Pulmonary: Normal work of breathing, + minimal wheeze bilaterally, improved compared to yesterday.  Abdomen: Soft, nontender with normoactive bowel sounds with no rebound or guarding   Neuro: Alert and oriented x3, no focal deficits.   MSK: ROM intact  Pertinent Labs, Studies, and Procedures:     Latest Ref Rng & Units 06/19/2023    2:54 AM 06/18/2023    3:23 AM 06/17/2023    9:02 AM  CBC  WBC 4.0 - 10.5 K/uL 10.2  12.4  12.4   Hemoglobin 12.0 - 15.0 g/dL 29.5  62.1  30.8   Hematocrit 36.0 - 46.0 % 43.7  42.9  44.2   Platelets 150 - 400 K/uL 482  340  482        Latest Ref Rng & Units 06/20/2023    3:10 AM 06/19/2023    2:54 AM 06/18/2023    3:23 AM  CMP  Glucose 70 - 99 mg/dL 657  846  962   BUN 8 - 23 mg/dL 20  25  24    Creatinine 0.44 - 1.00 mg/dL 9.52  8.41  3.24   Sodium 135 - 145 mmol/L 138  141  138   Potassium 3.5 - 5.1 mmol/L 4.4  4.5  4.3   Chloride 98 - 111 mmol/L 98  97  101   CO2 22 - 32  mmol/L 32  35  27   Calcium 8.9 - 10.3 mg/dL 9.2  9.6  9.1     DG CHEST PORT 1 VIEW  Result Date: 06/13/2023 CLINICAL DATA:  Aspiration into airway. EXAM: PORTABLE CHEST 1 VIEW COMPARISON:  June 12, 2023. FINDINGS: The heart size and mediastinal contours are within normal limits. Right lung is clear. Minimal left basilar subsegmental atelectasis is noted. The visualized skeletal structures are unremarkable. IMPRESSION: Minimal left basilar subsegmental atelectasis. Electronically  Signed   By: Lupita Raider M.D.   On: 06/13/2023 20:40   DG Chest 2 View  Result Date: 06/12/2023 CLINICAL DATA:  Short of breath.  History of COPD. EXAM: CHEST - 2 VIEW COMPARISON:  10/05/2021.  CT, 02/07/2023. FINDINGS: Cardiac silhouette is normal in size. Stable left coronary artery stent. No mediastinal or hilar masses. No convincing adenopathy. Lungs are mildly hyperexpanded. Lung base interstitial and bronchial wall thickening has increased compared to the prior chest radiograph, similar to the more recent prior chest CT. No lung consolidation. No pleural effusion or pneumothorax. Skeletal structures with no acute findings. IMPRESSION: 1. No acute cardiopulmonary disease. 2. Lung base interstitial and bronchial wall thickening consistent with bronchitis and/or bronchiolitis. Electronically Signed   By: Amie Portland M.D.   On: 06/12/2023 08:24     Signed: Jeral Pinch, D.O.  Internal Medicine Resident, PGY-1 [name]  Pager: [phone] 3:09 PM, 06/20/2023

## 2023-06-20 NOTE — Progress Notes (Signed)
Order received to discharge patient.  PIV access x1 removed without difficulty.  Equipment delivered to patient's room prior to DC.  TOC meds delivered to patient's room prior to DC.  Discharge instructions, follow up, medications and instructions for their use discussed with patient.

## 2023-06-20 NOTE — Progress Notes (Signed)
Physical Therapy Treatment Patient Details Name: [name] [name] MRN: [id number] DOB: 1955/08/12 Today's Date: 06/20/2023   History of Present Illness Pt is 67 yo female who presents on 06/12/23 with SOB. Acute COPD exacerbation. Aspiration even 11/29 with need for Bipap. PMH:  HTN, CAD with multiple DES, DM2, COPD, anxiety, hypothyroidism, RTC repair 12/24/2021, ongoing tobacco abuse.    PT Comments  Pt continues to slowly improve. Pt able to ambulate at [facility] for 80 ft without knees buckling on 2L O2 at 88% or greater. Pt is Mod I for bed mobility and sit to stand with RW. Pt will benefit from rollator for pacing activities due to fatigue and O2 respiratory deficits. Due to pt current functional status, home set up and available assistance at home recommending skilled physical therapy services 3x/weekly on discharge from acute care hospital setting in order to decrease risk for falls, immobility, injury and re-hospitalization. Pt was fatigued at end of session.    If plan is discharge home, recommend the following: A little help with walking and/or transfers;Assistance with cooking/housework;Assist for transportation;Help with stairs or ramp for entrance     Equipment Recommendations  Rollator (4 wheels)       Precautions / Restrictions Precautions Precautions: Fall;Other (comment) Precaution Comments: monitor SOB and O2 Restrictions Weight Bearing Restrictions: No Other Position/Activity Restrictions: not on home O2 PTA     Mobility  Bed Mobility Overal bed mobility: Modified Independent     General bed mobility comments: sitting EOB with increased time but no physical assist    Transfers Overall transfer level: Modified independent Equipment used: Rolling walker (2 wheels) Transfers: Sit to/from Stand Sit to Stand: Modified independent (Device/Increase time)      Ambulation/Gait Ambulation/Gait assistance: Contact guard assist Gait Distance (Feet): 80 Feet Assistive device:  Rolling walker (2 wheels) Gait Pattern/deviations: Step-through pattern, Decreased stride length, Knees buckling Gait velocity: decreased Gait velocity interpretation: <1.31 ft/sec, indicative of household ambulator   General Gait Details: No knees buckling this session; did not ambulate without O2 due to O2 sats in seated 87% increased to 91% on 2L O2. Pt was able to ambulte at 88-90% on 2L O2 for 80 feet        Balance Overall balance assessment: Mild deficits observed, not formally tested        Cognition Arousal: Alert Behavior During Therapy: WFL for tasks assessed/performed Overall Cognitive Status: Within Functional Limits for tasks assessed           General Comments General comments (skin integrity, edema, etc.): Walk test 2 L O2 during gait. End of sessoin pt sitting EOB with O2 sats 92% on 2L.      Pertinent Vitals/Pain Pain Assessment Pain Assessment: No/denies pain     PT Goals (current goals can now be found in the care plan section) Acute Rehab PT Goals Patient Stated Goal: return home PT Goal Formulation: With patient Time For Goal Achievement: 06/30/23 Potential to Achieve Goals: Good Progress towards PT goals: Progressing toward goals    Frequency    Min 1X/week      PT Plan  Continue with current POC       AM-PAC PT "6 Clicks" Mobility   Outcome Measure  Help needed turning from your back to your side while in a flat bed without using bedrails?: None Help needed moving from lying on your back to sitting on the side of a flat bed without using bedrails?: None Help needed moving to and from a bed to  a chair (including a wheelchair)?: None Help needed standing up from a chair using your arms (e.g., wheelchair or bedside chair)?: None Help needed to walk in hospital room?: A Little Help needed climbing 3-5 steps with a railing? : A Little 6 Click Score: 22    End of Session Equipment Utilized During Treatment: Oxygen;Gait belt Activity  Tolerance: Patient limited by fatigue;Patient tolerated treatment well Patient left: with call bell/phone within reach;in bed Nurse Communication: Mobility status PT Visit Diagnosis: Muscle weakness (generalized) (M62.81);Pain;Difficulty in walking, not elsewhere classified (R26.2)     Time: 5638-7564 PT Time Calculation (min) (ACUTE ONLY): 15 min  Charges:    $Therapeutic Activity: 8-22 mins PT General Charges $$ ACUTE PT VISIT: 1 Visit                     Harrel Carina, DPT, CLT  Acute Rehabilitation Services Office: [phone] (Secure chat preferred)    Claudia Desanctis 06/20/2023, 2:38 PM

## 2023-06-20 NOTE — TOC Transition Note (Signed)
Transition of Care (TOC) - CM/SW Discharge Note Donn Pierini RN, BSN Transitions of Care Unit 4E- RN Case Manager See Treatment Team for direct phone #   Patient Details  Name: [name] [name] MRN: [id number] Date of Birth: Sep 13, 1955  Transition of Care [facility]) CM/SW Contact:  Darrold Span, RN Phone Number: 06/20/2023, 3:17 PM   Clinical Narrative:    Pt stable for transition home today,  Orders for HHPT/OT placed, and pt will need home 02 and DME- orders placed.   CM in to speak with pt and offer choice for [facility] and CME providers- list provided for [facility] Per CMS guidelines from [employer]ancing[employer]pl website with star ratings (copy placed in shadow chart)- per pt she does not have a preference for [facility] or DME providers.  Per pt's insurance - DME is contracted through [employer]   Pt voiced she has RW but does feel she needs rollator- RW was given to pt so insurance should cover rollator- pt declined need for [facility]- has bathroom off bedroom.   Address, phone # and PCP all confirmed- however pt does voice she plans to go stay with her daughter for awhile- address to daughter's home provided by pt- [address] [place], [place] [address]  Daughter to transport home after work today  Call made to [employer]- referral for [facility] has been accepted per [employer].   Call made to [employer] liaison- Mitch- referral will be processed for home 02 and rollator-  Transport 02 and rollator to be delivered to room prior to d/c- home equipment to be delivered to daughter's home.    Final next level of care: Home w Home Health Services Barriers to Discharge: Barriers Resolved   Patient Goals and CMS Choice CMS Medicare.gov Compare Post Acute Care list provided to:: Patient Choice offered to / list presented to : Patient  Discharge Placement               Home w/ [facility]          Discharge Plan and Services Additional resources added to the After Visit Summary for     Discharge Planning  Services: CM Consult Post Acute Care Choice: Home Health, Durable Medical Equipment          DME Arranged: Oxygen, Walker rolling with seat DME Agency: [employer] Date DME Agency Contacted: 06/20/23 Time DME Agency Contacted: 1445 Representative spoke with at DME Agency: Mitch HH Arranged: PT, OT HH Agency: [facility] Date [facility] Agency Contacted: 06/20/23 Time HH Agency Contacted: 1440 Representative spoke with at [facility] Agency: Kandee Keen  Social Determinants of Health (SDOH) Interventions SDOH Screenings   Food Insecurity: No Food Insecurity (06/12/2023)  Housing: Low Risk  (06/12/2023)  Transportation Needs: No Transportation Needs (06/12/2023)  Utilities: Not At Risk (06/12/2023)  Alcohol Screen: Low Risk  (12/24/2022)  Depression (PHQ2-9): Medium Risk (04/25/2023)  Financial Resource Strain: Low Risk  (12/24/2022)  Physical Activity: Inactive (12/24/2022)  Social Connections: Moderately Isolated (12/24/2022)  Stress: No Stress Concern Present (12/24/2022)  Tobacco Use: High Risk (06/13/2023)     Readmission Risk Interventions    06/20/2023    3:17 PM  Readmission Risk Prevention Plan  Transportation Screening Complete  HRI or Home Care Consult Complete  Social Work Consult for Recovery Care Planning/Counseling Complete  Palliative Care Screening Not Applicable  Medication Review [employer]) Complete

## 2023-06-20 NOTE — Progress Notes (Signed)
SATURATION QUALIFICATIONS: (This note is used to comply with regulatory documentation for home oxygen)  Patient Saturations on Room Air at Rest = 87%  Patient Saturations on Room Air while Ambulating = did not ambulate on room aire due to 87% saturation in sitting on room air  Patient Saturations on 2 Liters of oxygen while Ambulating = 88%  Please briefly explain why patient needs home oxygen: Pt desaturates in sitting on room air. Ambulation increases need for oxygen and pt is 88% on 2L O2 for 80 ft of gait  Harrel Carina, DPT, CLT  Acute Rehabilitation Services Office: [phone] (Secure chat preferred)

## 2023-06-22 ENCOUNTER — Encounter: Payer: Self-pay | Admitting: Cardiovascular Disease

## 2023-06-22 DIAGNOSIS — E039 Hypothyroidism, unspecified: Secondary | ICD-10-CM | POA: Diagnosis not present

## 2023-06-22 DIAGNOSIS — J9601 Acute respiratory failure with hypoxia: Secondary | ICD-10-CM | POA: Diagnosis not present

## 2023-06-22 DIAGNOSIS — R7303 Prediabetes: Secondary | ICD-10-CM | POA: Diagnosis not present

## 2023-06-22 DIAGNOSIS — F32A Depression, unspecified: Secondary | ICD-10-CM | POA: Diagnosis not present

## 2023-06-22 DIAGNOSIS — I2510 Atherosclerotic heart disease of native coronary artery without angina pectoris: Secondary | ICD-10-CM | POA: Diagnosis not present

## 2023-06-22 DIAGNOSIS — I110 Hypertensive heart disease with heart failure: Secondary | ICD-10-CM | POA: Diagnosis not present

## 2023-06-22 DIAGNOSIS — F419 Anxiety disorder, unspecified: Secondary | ICD-10-CM | POA: Diagnosis not present

## 2023-06-22 DIAGNOSIS — J441 Chronic obstructive pulmonary disease with (acute) exacerbation: Secondary | ICD-10-CM | POA: Diagnosis not present

## 2023-06-22 DIAGNOSIS — I5032 Chronic diastolic (congestive) heart failure: Secondary | ICD-10-CM | POA: Diagnosis not present

## 2023-06-23 ENCOUNTER — Telehealth: Payer: Self-pay | Admitting: Internal Medicine

## 2023-06-23 ENCOUNTER — Telehealth: Payer: Self-pay

## 2023-06-23 DIAGNOSIS — F419 Anxiety disorder, unspecified: Secondary | ICD-10-CM | POA: Diagnosis not present

## 2023-06-23 DIAGNOSIS — J9601 Acute respiratory failure with hypoxia: Secondary | ICD-10-CM | POA: Diagnosis not present

## 2023-06-23 DIAGNOSIS — R7303 Prediabetes: Secondary | ICD-10-CM | POA: Diagnosis not present

## 2023-06-23 DIAGNOSIS — J441 Chronic obstructive pulmonary disease with (acute) exacerbation: Secondary | ICD-10-CM | POA: Diagnosis not present

## 2023-06-23 DIAGNOSIS — I5032 Chronic diastolic (congestive) heart failure: Secondary | ICD-10-CM | POA: Diagnosis not present

## 2023-06-23 DIAGNOSIS — I110 Hypertensive heart disease with heart failure: Secondary | ICD-10-CM | POA: Diagnosis not present

## 2023-06-23 DIAGNOSIS — I2510 Atherosclerotic heart disease of native coronary artery without angina pectoris: Secondary | ICD-10-CM | POA: Diagnosis not present

## 2023-06-23 DIAGNOSIS — F32A Depression, unspecified: Secondary | ICD-10-CM | POA: Diagnosis not present

## 2023-06-23 DIAGNOSIS — E039 Hypothyroidism, unspecified: Secondary | ICD-10-CM | POA: Diagnosis not present

## 2023-06-23 NOTE — Telephone Encounter (Signed)
Call placed to  [name] [name] with [name] [name] unable to reach message left on VM.    Verbal orders given on a secure line for   [name] [name] with [name] [name]  Physical Therapy Frequency: 2 x 2 then 1 x 3 and every other week for 4 weeks

## 2023-06-23 NOTE — Telephone Encounter (Addendum)
Spoke with patient Daughter [name]. There are no appointment available next week for a hospital follow-up at this office. Patient will go to our mobile unit on Tuesday.

## 2023-06-23 NOTE — Transitions of Care (Post Inpatient/ED Visit) (Signed)
   06/23/2023  Name: [name] [name] MRN: [id number] DOB: June 29, 1956  Today's TOC FU Call Status: Today's TOC FU Call Status:: Unsuccessful Call (1st Attempt) Unsuccessful Call (1st Attempt) Date: 06/23/23  Attempted to reach the patient regarding the most recent Inpatient/ED visit.  Follow Up Plan: Additional outreach attempts will be made to reach the patient to complete the Transitions of Care (Post Inpatient/ED visit) call.   Alyse Low, RN, BA, [facility], CRRN [facility] Coordinator, Transition of Care Ph # [phone]

## 2023-06-23 NOTE — Telephone Encounter (Signed)
Copied from CRM [id number]. Topic: General - Inquiry >> Jun 20, 2023 11:18 AM Patsy Lager T wrote: Reason for CRM: Dr Merrilee Jansky called to make a HFU appt for patient who is being discharged 12/06 but there were no appts before Jan and provider needs her seen by next week for COPD. Please f/u with patient >> Jun 23, 2023 11:42 AM Shon Hale wrote: Daughter, Dyann Kief calling to follow up on hospital f/u appt. Tiffany states pt is staying with her in the meantime. Tiffany requesting that she be called for hospital f/u instead of pt, [phone]   Contacted pt sch appt jan 08-2023 @ 2:30 pm.......Marland Kitchen pt daughter also wants to speak to a nurse about more information regarding what to do until then concerning with oxygen tank and meds

## 2023-06-23 NOTE — Telephone Encounter (Signed)
Copied from CRM [id number]. Topic: Quick Communication - Home Health Verbal Orders >> Jun 23, 2023  9:09 AM Macon Large wrote: Caller/Agency: Carmon Sails with [facility] Number: [phone] Service Requested: Physical Therapy Frequency: 2 x 2 then 1 x 3 and every other week for 4 weeks Any new concerns about the patient? Yes - Per Marcelino Duster pt takes metformin and it is suppose to be taken twice daily but pt daughter gives it to pt all at once because sometimes pt only eats once. Marcelino Duster also concerned whether pt is suppose to be checking blood sugar regularly

## 2023-06-24 ENCOUNTER — Telehealth: Payer: Self-pay

## 2023-06-24 NOTE — Transitions of Care (Post Inpatient/ED Visit) (Signed)
   06/24/2023  Name: [name] [name] MRN: [id number] DOB: 02/09/1956  Today's TOC FU Call Status: Today's TOC FU Call Status:: Unsuccessful Call (2nd Attempt) Unsuccessful Call (2nd Attempt) Date: 06/24/23  Attempted to reach the patient regarding the most recent Inpatient/ED visit.  Follow Up Plan: Additional outreach attempts will be made to reach the patient to complete the Transitions of Care (Post Inpatient/ED visit) call.   Alyse Low, RN, BA, [facility], CRRN [facility] Coordinator, Transition of Care Ph # [phone]

## 2023-06-25 ENCOUNTER — Other Ambulatory Visit: Payer: Self-pay | Admitting: Internal Medicine

## 2023-06-25 ENCOUNTER — Telehealth: Payer: Self-pay

## 2023-06-25 ENCOUNTER — Other Ambulatory Visit (HOSPITAL_COMMUNITY): Payer: Self-pay

## 2023-06-25 DIAGNOSIS — F419 Anxiety disorder, unspecified: Secondary | ICD-10-CM | POA: Diagnosis not present

## 2023-06-25 DIAGNOSIS — I110 Hypertensive heart disease with heart failure: Secondary | ICD-10-CM | POA: Diagnosis not present

## 2023-06-25 DIAGNOSIS — R7303 Prediabetes: Secondary | ICD-10-CM | POA: Diagnosis not present

## 2023-06-25 DIAGNOSIS — I2510 Atherosclerotic heart disease of native coronary artery without angina pectoris: Secondary | ICD-10-CM | POA: Diagnosis not present

## 2023-06-25 DIAGNOSIS — J441 Chronic obstructive pulmonary disease with (acute) exacerbation: Secondary | ICD-10-CM | POA: Diagnosis not present

## 2023-06-25 DIAGNOSIS — J9601 Acute respiratory failure with hypoxia: Secondary | ICD-10-CM | POA: Diagnosis not present

## 2023-06-25 DIAGNOSIS — F32A Depression, unspecified: Secondary | ICD-10-CM | POA: Diagnosis not present

## 2023-06-25 DIAGNOSIS — I5032 Chronic diastolic (congestive) heart failure: Secondary | ICD-10-CM | POA: Diagnosis not present

## 2023-06-25 DIAGNOSIS — E039 Hypothyroidism, unspecified: Secondary | ICD-10-CM | POA: Diagnosis not present

## 2023-06-25 MED ORDER — TRELEGY ELLIPTA 100-62.5-25 MCG/ACT IN AEPB: 1.0000 | INHALATION_SPRAY | Freq: Every day | RESPIRATORY_TRACT | 10 refills | Status: DC

## 2023-06-25 NOTE — Transitions of Care (Post Inpatient/ED Visit) (Signed)
   06/25/2023  Name: LURIA SONDERGAARD MRN: [id number] DOB: 11/28/1955  Today's TOC FU Call Status: Today's TOC FU Call Status:: Unsuccessful Call (3rd Attempt) Unsuccessful Call (3rd Attempt) Date: 06/25/23  Attempted to reach the patient regarding the most recent Inpatient/ED visit.  Follow Up Plan: No further outreach attempts will be made at this time. We have been unable to contact the patient.  Alyse Low, RN, BA, [facility], CRRN [facility] Coordinator, Transition of Care Ph # [phone]

## 2023-06-26 ENCOUNTER — Ambulatory Visit (HOSPITAL_COMMUNITY): Payer: Medicare HMO | Attending: Internal Medicine

## 2023-06-26 ENCOUNTER — Ambulatory Visit: Payer: Medicare HMO | Admitting: Cardiovascular Disease

## 2023-06-26 DIAGNOSIS — I10 Essential (primary) hypertension: Secondary | ICD-10-CM | POA: Insufficient documentation

## 2023-06-26 DIAGNOSIS — E785 Hyperlipidemia, unspecified: Secondary | ICD-10-CM | POA: Diagnosis not present

## 2023-06-26 DIAGNOSIS — I2510 Atherosclerotic heart disease of native coronary artery without angina pectoris: Secondary | ICD-10-CM | POA: Diagnosis not present

## 2023-06-26 DIAGNOSIS — F17200 Nicotine dependence, unspecified, uncomplicated: Secondary | ICD-10-CM | POA: Diagnosis not present

## 2023-06-26 LAB — ECHOCARDIOGRAM COMPLETE
Calc EF: 58.1 %
S' Lateral: 2.55 cm
Single Plane A2C EF: 57.8 %
Single Plane A4C EF: 58 %

## 2023-06-27 ENCOUNTER — Telehealth (INDEPENDENT_AMBULATORY_CARE_PROVIDER_SITE_OTHER): Payer: Medicare HMO | Admitting: Physician Assistant

## 2023-06-27 ENCOUNTER — Other Ambulatory Visit: Payer: Self-pay

## 2023-06-27 ENCOUNTER — Other Ambulatory Visit (HOSPITAL_COMMUNITY): Payer: Self-pay

## 2023-06-27 DIAGNOSIS — F411 Generalized anxiety disorder: Secondary | ICD-10-CM

## 2023-06-27 DIAGNOSIS — F3342 Major depressive disorder, recurrent, in full remission: Secondary | ICD-10-CM | POA: Diagnosis not present

## 2023-06-27 LAB — COMPREHENSIVE METABOLIC PANEL
ALT: 37 [IU]/L — ABNORMAL HIGH (ref 0–32)
AST: 15 [IU]/L (ref 0–40)
Albumin: 4 g/dL (ref 3.9–4.9)
Alkaline Phosphatase: 85 [IU]/L (ref 44–121)
BUN/Creatinine Ratio: 19 (ref 12–28)
BUN: 14 mg/dL (ref 8–27)
Bilirubin Total: 0.4 mg/dL (ref 0.0–1.2)
CO2: 29 mmol/L (ref 20–29)
Calcium: 9 mg/dL (ref 8.7–10.3)
Chloride: 99 mmol/L (ref 96–106)
Creatinine, Ser: 0.74 mg/dL (ref 0.57–1.00)
Globulin, Total: 2.2 g/dL (ref 1.5–4.5)
Glucose: 65 mg/dL — ABNORMAL LOW (ref 70–99)
Potassium: 3.7 mmol/L (ref 3.5–5.2)
Sodium: 142 mmol/L (ref 134–144)
Total Protein: 6.2 g/dL (ref 6.0–8.5)
eGFR: 89 mL/min/{1.73_m2} (ref >59)

## 2023-06-27 LAB — LIPID PANEL
Chol/HDL Ratio: 2.7 {ratio} (ref 0.0–4.4)
Cholesterol, Total: 192 mg/dL (ref 100–199)
HDL: 71 mg/dL (ref >39)
LDL Chol Calc (NIH): 93 mg/dL (ref 0–99)
Triglycerides: 168 mg/dL — ABNORMAL HIGH (ref 0–149)
VLDL Cholesterol Cal: 28 mg/dL (ref 5–40)

## 2023-06-27 LAB — LIPOPROTEIN A (LPA): Lipoprotein (a): 41.6 nmol/L (ref <75.0)

## 2023-06-27 MED ORDER — MIRTAZAPINE 45 MG PO TABS
45.0000 mg | ORAL_TABLET | Freq: Every day | ORAL | 3 refills | Status: DC
  Filled 2023-06-27 – 2023-07-08 (×2): qty 30, 30d supply, fill #0
  Filled 2023-08-07: qty 30, 30d supply, fill #1
  Filled 2023-08-15: qty 30, 30d supply, fill #0
  Filled 2023-08-15 – 2023-09-08 (×2): qty 30, 30d supply, fill #1

## 2023-06-27 MED ORDER — DOXEPIN HCL 75 MG PO CAPS
75.0000 mg | ORAL_CAPSULE | Freq: Every day | ORAL | 3 refills | Status: DC
  Filled 2023-06-27 – 2023-07-08 (×2): qty 30, 30d supply, fill #0
  Filled 2023-08-07: qty 30, 30d supply, fill #1
  Filled 2023-08-15: qty 30, 30d supply, fill #0
  Filled 2023-08-15 – 2023-09-08 (×2): qty 30, 30d supply, fill #1

## 2023-06-27 MED ORDER — CLONAZEPAM 0.5 MG PO TABS
0.2500 mg | ORAL_TABLET | Freq: Two times a day (BID) | ORAL | 0 refills | Status: DC | PRN
  Filled 2023-06-27: qty 30, 30d supply, fill #0

## 2023-06-27 MED ORDER — BUSPIRONE HCL 15 MG PO TABS
ORAL_TABLET | ORAL | 3 refills | Status: DC
  Filled 2023-06-27 – 2023-07-08 (×2): qty 120, 30d supply, fill #0
  Filled 2023-08-07: qty 120, 30d supply, fill #1
  Filled 2023-08-15: qty 120, 30d supply, fill #0
  Filled 2023-08-15 – 2023-09-08 (×2): qty 120, 30d supply, fill #1

## 2023-06-27 MED ORDER — ESCITALOPRAM OXALATE 20 MG PO TABS
20.0000 mg | ORAL_TABLET | Freq: Every day | ORAL | 3 refills | Status: DC
  Filled 2023-06-27 – 2023-07-08 (×2): qty 30, 30d supply, fill #0
  Filled 2023-08-07: qty 30, 30d supply, fill #1
  Filled 2023-08-15: qty 30, 30d supply, fill #0
  Filled 2023-08-15 – 2023-09-08 (×2): qty 30, 30d supply, fill #1

## 2023-06-29 ENCOUNTER — Encounter (HOSPITAL_COMMUNITY): Payer: Self-pay | Admitting: Physician Assistant

## 2023-06-29 NOTE — Progress Notes (Unsigned)
BH MD/PA/NP OP Progress Note  Virtual Visit via Video Note  I connected with [name] [name] on 06/29/23 at  4:30 PM EST by a video enabled telemedicine application and verified that I am speaking with the correct person using two identifiers.  Location: Patient: Home Provider: Clinic   I discussed the limitations of evaluation and management by telemedicine and the availability of in person appointments. The patient expressed understanding and agreed to proceed.  Follow Up Instructions:   I discussed the assessment and treatment plan with the patient. The patient was provided an opportunity to ask questions and all were answered. The patient agreed with the plan and demonstrated an understanding of the instructions.   The patient was advised to call back or seek an in-person evaluation if the symptoms worsen or if the condition fails to improve as anticipated.  I provided 16 minutes of non-face-to-face time during this encounter.  Meta Hatchet, PA   06/29/2023 11:15 PM [name] [name]  MRN:  [id number]  Chief Complaint:  Chief Complaint  Patient presents with   Follow-up   Medication Management   HPI:   [name] [name]. Partsch is a 67 year old female with a past psychiatric history significant for insomnia, generalized anxiety disorder, and major depressive disorder who presents to [facility] via virtual video visit for follow-up and medication management.  Patient is currently being managed on the following medications:  Doxepin (Sinequan) 50 mg at bedtime Buspirone 15 mg 3 times daily in the morning/1 tablet at noon/1 tablet in the evening  Escitalopram 30 mg daily Clonazepam 0.5 mg 2 times daily as needed Mirtazapine 30 mg at bedtime  Patient presents to the encounter stating that she was recently discharged from the hospital due to COPD exacerbation.  Patient reports that she was hospitalized for a week and a day and feels much better  since being discharged.  Patient reports that she is currently staying with her daughter and her husband while she recovers.  Patient denies overt depressive symptoms and states that she experiences anxiety only when unable to breathe.  Patient denies any new stressors at this time.  Patient is currently taking clonazepam 0.5 mg 2 times daily as needed for the management of her anxiety.  Patient reports that she only takes this medication when stressed out.  Provider recommended decreasing her dosage of the medication from 0.5 mg to 0.25 mg 2 times daily as needed.  Patient was agreeable to recommendation.  A GAD-7 screen was performed the patient scoring a 3.  Patient is alert and oriented x4, pleasant, calm, cooperative, and fully engaged in conversation during the encounter.  Patient describes her mood as well rested.  Patient denies suicidal or homicidal ideations.  She further denies auditory or visual hallucinations and does not appear to be responding to internal/external stimuli.  Patient endorses good sleep and receives on average 12 hours of sleep per night.  Patient endorses good appetite and eats on average 2 meals per day.  Patient denies alcohol consumption and illicit drug use.  Patient endorses tobacco use and smokes on average 6 cigarettes/day.  Visit Diagnosis:    ICD-10-CM   1. Major depressive disorder, recurrent, in full remission with anxious distress (HCC)  F33.42 doxepin (SINEQUAN) 75 MG capsule    mirtazapine (REMERON) 45 MG tablet    busPIRone (BUSPAR) 15 MG tablet    escitalopram (LEXAPRO) 20 MG tablet    2. GAD (generalized anxiety disorder)  F41.1 mirtazapine (  REMERON) 45 MG tablet    busPIRone (BUSPAR) 15 MG tablet    escitalopram (LEXAPRO) 20 MG tablet    clonazePAM (KLONOPIN) 0.5 MG tablet      Past Psychiatric History:  Insomnia Major depressive disorder Generalized anxiety disorder  Past Medical History:  Past Medical History:  Diagnosis Date   Allergy     seasonal   Anemia    Anxiety    takes Lexapro daily   Arthritis    "back, from neck down pass my bra area" (03/25/2017)   Asthma    Bartholin gland cyst 08/29/2011   Bruises easily    pt is on Effient   Chronic back pain    herniated nucleus pulposus   Chronic back pain    "neck to bra area; lower back" (03/25/2017)   Chronic kidney disease    recurrent UTI's this year 2022   COPD (chronic obstructive pulmonary disease) (HCC)    early stages   Coronary artery disease    Depression    takes Klonopin daily   Diabetes mellitus without complication (HCC)    Diverticulosis    Fibroadenoma of left breast    GERD (gastroesophageal reflux disease)    takes Nexium daily   H/O hiatal hernia    Heart attack (HCC) 2011   Hemorrhoids    Hernia    Hyperlipidemia    takes Lipitor daily   Hypertension    takes Losartan daily and Labetalol bid   Hypothyroidism    takes Synthroid daily   Insomnia    hydroxyzine prn   Joint pain    Pneumonia    "couple times" (03/25/2017)   Pre-diabetes    "just found out 1 wk ago" (03/25/2017)   Psoriasis    elbows,knees,back   Shortness of breath    with exertion   Slowing of urinary stream    Stress incontinence     Past Surgical History:  Procedure Laterality Date   ABDOMINAL EXPLORATION SURGERY  1977   ABDOMINAL HYSTERECTOMY  1977   "left one of my ovaries"   BACK SURGERY     BIOPSY  05/27/2019   Procedure: BIOPSY;  Surgeon: Corbin Ade, MD;  Location: [facility];  Service: Endoscopy;;   BREAST BIOPSY Left 11/2021   x2   COLONOSCOPY  2011   Dr. Christella Hartigan: Mild diverticulosis, descending diminutive colon polyp (not retrieved), next colonoscopy 10 years   CORONARY ANGIOPLASTY WITH STENT PLACEMENT  2011 X2   "regular stents didn't work; had to go back in in ~ 1 month and put in medicated stents"   DILATION AND CURETTAGE OF UTERUS     ESOPHAGOGASTRODUODENOSCOPY     ESOPHAGOGASTRODUODENOSCOPY (EGD) WITH PROPOFOL N/A 05/27/2019    normal esophagus, dilation, erosive gastropathy s/p biopsy, normal duodenum. Negative H.pylori.    LEFT HEART CATH AND CORONARY ANGIOGRAPHY N/A 03/26/2017   Procedure: LEFT HEART CATH AND CORONARY ANGIOGRAPHY;  Surgeon: Lyn Records, MD;  Location: [facility] INVASIVE CV LAB;  Service: Cardiovascular;  Laterality: N/A;   LUMBAR LAMINECTOMY/DECOMPRESSION MICRODISCECTOMY  08/05/2011   Procedure: LUMBAR LAMINECTOMY/DECOMPRESSION MICRODISCECTOMY;  Surgeon: Clydene Fake, MD;  Location: [facility] NEURO ORS;  Service: Neurosurgery;  Laterality: Right;  Right Lumbar four-five extraforaminal discectomy   MALONEY DILATION N/A 05/27/2019   Procedure: Elease Hashimoto DILATION;  Surgeon: Corbin Ade, MD;  Location: [facility];  Service: Endoscopy;  Laterality: N/A;  54   SHOULDER ARTHROSCOPY WITH ROTATOR CUFF REPAIR AND OPEN BICEPS TENODESIS Right 12/24/2021   Procedure:  RIGHT SHOULDER ARTHROSCOPY WITH ROTATOR CUFF REPAIR AND BICEPS TENODESIS;  Surgeon: Eldred Manges, MD;  Location: [facility] OR;  Service: Orthopedics;  Laterality: Right;   TONSILLECTOMY     as a child   UPPER GASTROINTESTINAL ENDOSCOPY      Family Psychiatric History:  See intake H&P for full details. Reviewed, with no updates at this time.  Family History:  Family History  Problem Relation Age of Onset   Heart disease Mother    Hypertension Mother    Stroke Mother    Mental illness Mother    Heart disease Father    Hypertension Father    Diabetes Father    Colon polyps Brother    Heart disease Maternal Grandfather    Cancer Paternal Grandmother        mouth   Anesthesia problems Daughter    Breast cancer Maternal Aunt    Throat cancer Maternal Uncle    Thyroid disease Paternal Aunt    Hypotension Neg Hx    Malignant hyperthermia Neg Hx    Pseudochol deficiency Neg Hx    Colon cancer Neg Hx    Stomach cancer Neg Hx    Esophageal cancer Neg Hx    Pancreatic cancer Neg Hx    Rectal cancer Neg Hx     Social History:  Social History    Socioeconomic History   Marital status: Divorced    Spouse name: Not on file   Number of children: 1   Years of education: Not on file   Highest education level: 9th grade  Occupational History   Not on file  Tobacco Use   Smoking status: Every Day    Current packs/day: 0.50    Average packs/day: 0.5 packs/day for 50.0 years (25.0 ttl pk-yrs)    Types: Cigarettes   Smokeless tobacco: Never  Vaping Use   Vaping status: Some Days   Substances: Nicotine  Substance and Sexual Activity   Alcohol use: Yes    Comment: once a week   Drug use: Yes    Types: Marijuana    Comment: history of cocaine use, been about a year, per patient (12/18/21)   Sexual activity: Not Currently    Birth control/protection: Surgical  Other Topics Concern   Not on file  Social History Narrative   Not on file   Social Drivers of Health   Financial Resource Strain: Low Risk  (12/24/2022)   Overall Financial Resource Strain (CARDIA)    Difficulty of Paying Living Expenses: Not hard at all  Food Insecurity: No Food Insecurity (06/12/2023)   Hunger Vital Sign    Worried About Running Out of Food in the Last Year: Never true    Ran Out of Food in the Last Year: Never true  Transportation Needs: No Transportation Needs (06/12/2023)   PRAPARE - Administrator, Civil Service (Medical): No    Lack of Transportation (Non-Medical): No  Physical Activity: Inactive (12/24/2022)   Exercise Vital Sign    Days of Exercise per Week: 0 days    Minutes of Exercise per Session: 0 min  Stress: No Stress Concern Present (12/24/2022)   [employer] of Occupational Health - Occupational Stress Questionnaire    Feeling of Stress : Only a little  Social Connections: Moderately Isolated (12/24/2022)   Social Connection and Isolation Panel [NHANES]    Frequency of Communication with Friends and Family: More than three times a week    Frequency of Social Gatherings with Friends and Family:  Three times a week     Attends Religious Services: 1 to 4 times per year    Active Member of Clubs or Organizations: No    Attends Banker Meetings: Never    Marital Status: Divorced    Allergies:  Allergies  Allergen Reactions   Avocado Anaphylaxis   Latex Shortness Of Breath and Rash   Codeine Nausea Only    Metabolic Disorder Labs: Lab Results  Component Value Date   HGBA1C 5.7 01/17/2023   MPG 105.41 12/18/2021   MPG 119.76 10/05/2021   No results found for: "PROLACTIN" Lab Results  Component Value Date   CHOL 192 06/26/2023   TRIG 168 (H) 06/26/2023   HDL 71 06/26/2023   CHOLHDL 2.7 06/26/2023   VLDL 15 04/16/2016   LDLCALC 93 06/26/2023   LDLCALC 136 (H) 03/21/2022   Lab Results  Component Value Date   TSH 1.790 05/20/2023   TSH 3.180 09/10/2022    Therapeutic Level Labs: No results found for: "LITHIUM" No results found for: "VALPROATE" No results found for: "CBMZ"  Current Medications: Current Outpatient Medications  Medication Sig Dispense Refill   albuterol (VENTOLIN HFA) 108 (90 Base) MCG/ACT inhaler Inhale 2 puffs into the lungs every 6 (six) hours as needed for wheezing or shortness of breath. 18 g 12   alendronate (FOSAMAX) 35 MG tablet Take 1 tablet (35 mg total) by mouth every 7 (seven) days. Take with a full glass of water on an empty stomach. Sit upright for 30-60 mins after taking (Patient taking differently: Take 35 mg by mouth every Wednesday.) 12 tablet 1   amLODipine (NORVASC) 5 MG tablet Take 1 tablet (5 mg total) by mouth daily. 90 tablet 3   aspirin EC 81 MG tablet Take 81 mg by mouth daily.     atorvastatin (LIPITOR) 80 MG tablet Take 1 tablet (80 mg total) by mouth daily, please schedule appointment for more refills 90 tablet 3   Blood Glucose Monitoring Suppl (TRUE METRIX METER) DEVI 1 kit by Does not apply route 4 (four) times daily. 1 Device 0   busPIRone (BUSPAR) 15 MG tablet Take 2 tablets (30 mg total) by mouth in the morning AND 1 tablet  (15 mg total) daily at 12 noon AND 1 tablet (15 mg total) every evening. 120 tablet 3   [START ON 07/12/2023] clonazePAM (KLONOPIN) 0.5 MG tablet Take 0.5 tablets (0.25 mg total) by mouth 2 (two) times daily as needed for anxiety. 30 tablet 0   doxepin (SINEQUAN) 75 MG capsule Take 1 capsule (75 mg total) by mouth at bedtime. 30 capsule 3   escitalopram (LEXAPRO) 20 MG tablet Take 1 tablet (20 mg total) by mouth daily. 30 tablet 3   Fluticasone-Umeclidin-Vilant (TRELEGY ELLIPTA) 100-62.5-25 MCG/ACT AEPB Inhale 1 puff into the lungs daily. 60 each 10   furosemide (LASIX) 20 MG tablet Take 1 tablet (20 mg total) by mouth daily. 90 tablet 3   gabapentin (NEURONTIN) 600 MG tablet TAKE 1 TABLET BY MOUTH 4 TIMES DAILY 360 tablet 2   glucose blood (TRUE METRIX BLOOD GLUCOSE TEST) test strip Use as instructed 100 each 12   guaiFENesin (MUCINEX) 600 MG 12 hr tablet Take 1 tablet (600 mg total) by mouth 2 (two) times daily for 14 days. 28 tablet 0   ipratropium-albuterol (DUONEB) 0.5-2.5 (3) MG/3ML SOLN Take 3 mLs by nebulization 3 (three) times daily for 14 days. 180 mL 0   ketorolac (ACULAR) 0.5 % ophthalmic solution Place 1 drop  into the left eye 4 (four) times daily.     levothyroxine (SYNTHROID) 112 MCG tablet Take 1 tablet (112 mcg total) by mouth daily. 90 tablet 2   losartan (COZAAR) 50 MG tablet Take 1.5 tablets (75 mg total) by mouth daily. 135 tablet 3   metFORMIN (GLUCOPHAGE) 500 MG tablet Take 1 tablet (500 mg total) by mouth 2 (two) times daily with a meal. (Patient taking differently: Take 500 mg by mouth daily with lunch.) 180 tablet 1   mirtazapine (REMERON) 45 MG tablet Take 1 tablet (45 mg total) by mouth at bedtime. 30 tablet 3   montelukast (SINGULAIR) 10 MG tablet Take 1 tablet (10 mg total) by mouth at bedtime. 90 tablet 2   nebivolol (BYSTOLIC) 10 MG tablet Take 1 tablet (10 mg total) by mouth daily. 90 tablet 3   nicotine (NICODERM CQ - DOSED IN MG/24 HOURS) 14 mg/24hr patch Place 1  patch (14 mg total) onto the skin daily. 28 patch 0   nitroGLYCERIN (NITROSTAT) 0.4 MG SL tablet Place 1 tablet (0.4 mg total) under the tongue every 5 (five) minutes as needed for chest pain. 25 tablet 3   ofloxacin (OCUFLOX) 0.3 % ophthalmic solution Place 1 drop into the left eye 4 (four) times daily.     omeprazole (PRILOSEC) 40 MG capsule Take 1 capsule (40 mg total) by mouth 2 (two) times daily before a meal. 60 capsule 5   prednisoLONE acetate (PRED FORTE) 1 % ophthalmic suspension Place 1 drop into the left eye 4 (four) times daily.     Spacer/Aero-Holding Chambers (AEROCHAMBER MV) inhaler Use as instructed 1 each 1   ticagrelor (BRILINTA) 60 MG TABS tablet Take 1 tablet (60 mg total) by mouth 2 (two) times daily. 60 tablet 52   triamcinolone (KENALOG) 0.025 % ointment Apply 1 Application topically 2 (two) times daily as needed. 80 g 0   TRUEPLUS LANCETS 28G MISC 28 g by Does not apply route QID. 120 each 2   No current facility-administered medications for this visit.     Musculoskeletal: Strength & Muscle Tone: Unable to assess due to telemedicine visit Gait & Station: Unable to assess due to telemedicine visit Patient leans: Unable to assess due to telemedicine visit  Psychiatric Specialty Exam: Review of Systems  Psychiatric/Behavioral:  Negative for dysphoric mood, hallucinations, self-injury, sleep disturbance and suicidal ideas. The patient is not nervous/anxious and is not hyperactive.     There were no vitals taken for this visit.There is no height or weight on file to calculate BMI.  General Appearance: Casual  Eye Contact:  Good  Speech:  Clear and Coherent and Normal Rate  Volume:  Normal  Mood:  Euthymic  Affect:  Appropriate  Thought Process:  Coherent, Goal Directed, and Descriptions of Associations: Intact  Orientation:  Full (Time, Place, and Person)  Thought Content: WDL   Suicidal Thoughts:  No  Homicidal Thoughts:  No  Memory:  Immediate;   Good Recent;    Good Remote;   Good  Judgement:  Fair  Insight:  Fair  Psychomotor Activity:  Normal  Concentration:  Concentration: Good and Attention Span: Good  Recall:  [employer] of Knowledge: Fair  Language: Fair  Akathisia:  Negative  Handed:  Right  AIMS (if indicated): not done  Assets:  Communication Skills Desire for Improvement Housing  ADL's:  Intact  Cognition: WNL  Sleep:  Good   Screenings: AUDIT    Flowsheet Row [facility]  Alcohol Use Disorder Identification Test Final Score (AUDIT) 12      GAD-7    Flowsheet Row Video Visit from 06/27/2023 in [facility] Video Visit from 04/25/2023 in [facility] Video Visit from 03/14/2023 in [facility] Office Visit from 01/17/2023 in [facility] [place] [facility] A Dept Of [place]. [facility] Video Visit from 12/13/2022 in [facility]  Total GAD-7 Score 3 0 9 0 3      PHQ2-9    Flowsheet Row Video Visit from 06/27/2023 in [facility] Video Visit from 04/25/2023 in [facility] Video Visit from 03/14/2023 in [facility] Office Visit from 01/17/2023 in [facility] [place] - A Dept Of [place]. [facility] Clinical Support from 12/24/2022 in [facility] Dept Of [facility]. [facility]  PHQ-2 Total Score 0 2 2 0 0  PHQ-9 Total Score -- 5 8 1  --      Flowsheet Row Video Visit from 06/27/2023 in [facility] ED to Hosp-Admission (Discharged) from 06/12/2023 in [facility] 4E CV SURGICAL PROGRESSIVE CARE Video Visit from 04/25/2023 in [facility]  C-SSRS RISK CATEGORY No Risk No Risk No Risk        Assessment and Plan:   [name] [name]. [name] [name] is a  67 year old female with a past psychiatric history significant for insomnia, generalized anxiety disorder, and major depressive disorder who presents to [facility] via virtual video visit for follow-up and medication management.  Patient presents today encounter reporting no depressive symptoms.  She reports that she occasionally experiences anxiety when unable to breathe.  Patient is currently on clonazepam 0.5 mg 2 times daily as needed for the management of her anxiety.  She reports that she only takes the medication when stressed out.  Provider recommended decreasing her dosage from 0.5 mg to 0.25 mg 2 times daily as needed for the management of her anxiety.  Patient was hesitant at first but agreed to adjust her dosage.  Patient's medications to be e-prescribed to pharmacy of choice.  Collaboration of Care: Collaboration of Care: Medication Management AEB patient's medications being managed by this provider and Psychiatrist AEB patient being managed by this behavioral health provider  Patient/Guardian was advised Release of Information must be obtained prior to any record release in order to collaborate their care with an outside provider. Patient/Guardian was advised if they have not already done so to contact the registration department to sign all necessary forms in order for [place] to release information regarding their care.   Consent: Patient/Guardian gives verbal consent for treatment and assignment of benefits for services provided during this visit. Patient/Guardian expressed understanding and agreed to proceed.   1. Major depressive disorder, recurrent, in full remission with anxious distress (HCC)  - doxepin (SINEQUAN) 75 MG capsule; Take 1 capsule (75 mg total) by mouth at bedtime.  Dispense: 30 capsule; Refill: 3 - mirtazapine (REMERON) 45 MG tablet; Take 1 tablet (45 mg total) by mouth at bedtime.  Dispense: 30 tablet; Refill: 3 - busPIRone (BUSPAR)  15 MG tablet; Take 2 tablets (30 mg total) by mouth in the morning AND 1 tablet (15 mg total) daily at 12 noon AND 1 tablet (15 mg total) every evening.  Dispense: 120 tablet;  Refill: 3 - escitalopram (LEXAPRO) 20 MG tablet; Take 1 tablet (20 mg total) by mouth daily.  Dispense: 30 tablet; Refill: 3  2. GAD (generalized anxiety disorder)  - mirtazapine (REMERON) 45 MG tablet; Take 1 tablet (45 mg total) by mouth at bedtime.  Dispense: 30 tablet; Refill: 3 - busPIRone (BUSPAR) 15 MG tablet; Take 2 tablets (30 mg total) by mouth in the morning AND 1 tablet (15 mg total) daily at 12 noon AND 1 tablet (15 mg total) every evening.  Dispense: 120 tablet; Refill: 3 - escitalopram (LEXAPRO) 20 MG tablet; Take 1 tablet (20 mg total) by mouth daily.  Dispense: 30 tablet; Refill: 3 - clonazePAM (KLONOPIN) 0.5 MG tablet; Take 0.5 tablets (0.25 mg total) by mouth 2 (two) times daily as needed for anxiety.  Dispense: 30 tablet; Refill: 0  Patient to follow up in 2 months Provider spent a total of 16 minutes with the patient/reviewing patient's chart  Meta Hatchet, PA 06/29/2023, 11:15 PM

## 2023-06-30 ENCOUNTER — Other Ambulatory Visit (HOSPITAL_COMMUNITY): Payer: Self-pay

## 2023-06-30 ENCOUNTER — Other Ambulatory Visit: Payer: Self-pay

## 2023-07-01 ENCOUNTER — Ambulatory Visit: Payer: Medicare HMO | Admitting: Physician Assistant

## 2023-07-01 ENCOUNTER — Telehealth: Payer: Self-pay | Admitting: Internal Medicine

## 2023-07-01 DIAGNOSIS — I2510 Atherosclerotic heart disease of native coronary artery without angina pectoris: Secondary | ICD-10-CM | POA: Diagnosis not present

## 2023-07-01 DIAGNOSIS — F32A Depression, unspecified: Secondary | ICD-10-CM | POA: Diagnosis not present

## 2023-07-01 DIAGNOSIS — J9601 Acute respiratory failure with hypoxia: Secondary | ICD-10-CM | POA: Diagnosis not present

## 2023-07-01 DIAGNOSIS — I110 Hypertensive heart disease with heart failure: Secondary | ICD-10-CM | POA: Diagnosis not present

## 2023-07-01 DIAGNOSIS — R7303 Prediabetes: Secondary | ICD-10-CM | POA: Diagnosis not present

## 2023-07-01 DIAGNOSIS — F419 Anxiety disorder, unspecified: Secondary | ICD-10-CM | POA: Diagnosis not present

## 2023-07-01 DIAGNOSIS — E039 Hypothyroidism, unspecified: Secondary | ICD-10-CM | POA: Diagnosis not present

## 2023-07-01 DIAGNOSIS — I5032 Chronic diastolic (congestive) heart failure: Secondary | ICD-10-CM | POA: Diagnosis not present

## 2023-07-01 DIAGNOSIS — J441 Chronic obstructive pulmonary disease with (acute) exacerbation: Secondary | ICD-10-CM | POA: Diagnosis not present

## 2023-07-01 DIAGNOSIS — H2511 Age-related nuclear cataract, right eye: Secondary | ICD-10-CM | POA: Diagnosis not present

## 2023-07-01 NOTE — Telephone Encounter (Signed)
Home Health Verbal Orders - Caller/Agency: Don/ [facility] Number: [phone] Service Requested: Occupational Therapy Frequency: 1 wk 2  skip a week/ 1 wk 1 Any new concerns about the patient? Yes OT is for: :functional mobility , safety, health promotion, exercise and pain control

## 2023-07-01 NOTE — Telephone Encounter (Signed)
Left verbal order on confidential voice mail of [name] [name].

## 2023-07-02 DIAGNOSIS — R7303 Prediabetes: Secondary | ICD-10-CM | POA: Diagnosis not present

## 2023-07-02 DIAGNOSIS — F32A Depression, unspecified: Secondary | ICD-10-CM | POA: Diagnosis not present

## 2023-07-02 DIAGNOSIS — E039 Hypothyroidism, unspecified: Secondary | ICD-10-CM | POA: Diagnosis not present

## 2023-07-02 DIAGNOSIS — I2510 Atherosclerotic heart disease of native coronary artery without angina pectoris: Secondary | ICD-10-CM | POA: Diagnosis not present

## 2023-07-02 DIAGNOSIS — I110 Hypertensive heart disease with heart failure: Secondary | ICD-10-CM | POA: Diagnosis not present

## 2023-07-02 DIAGNOSIS — Z9889 Other specified postprocedural states: Secondary | ICD-10-CM | POA: Diagnosis not present

## 2023-07-02 DIAGNOSIS — H4322 Crystalline deposits in vitreous body, left eye: Secondary | ICD-10-CM | POA: Diagnosis not present

## 2023-07-02 DIAGNOSIS — J441 Chronic obstructive pulmonary disease with (acute) exacerbation: Secondary | ICD-10-CM | POA: Diagnosis not present

## 2023-07-02 DIAGNOSIS — F419 Anxiety disorder, unspecified: Secondary | ICD-10-CM | POA: Diagnosis not present

## 2023-07-02 DIAGNOSIS — J9601 Acute respiratory failure with hypoxia: Secondary | ICD-10-CM | POA: Diagnosis not present

## 2023-07-02 DIAGNOSIS — H43811 Vitreous degeneration, right eye: Secondary | ICD-10-CM | POA: Diagnosis not present

## 2023-07-02 DIAGNOSIS — I5032 Chronic diastolic (congestive) heart failure: Secondary | ICD-10-CM | POA: Diagnosis not present

## 2023-07-02 NOTE — Progress Notes (Signed)
BH MD/PA/NP OP Progress Note  Virtual Visit via Video Note  I connected with [name] [name] on 04/25/23 at  4:30 PM EDT by a video enabled telemedicine application and verified that I am speaking with the correct person using two identifiers.  Location: Patient: Home Provider: Clinic   I discussed the limitations of evaluation and management by telemedicine and the availability of in person appointments. The patient expressed understanding and agreed to proceed.  Follow Up Instructions:   I discussed the assessment and treatment plan with the patient. The patient was provided an opportunity to ask questions and all were answered. The patient agreed with the plan and demonstrated an understanding of the instructions.   The patient was advised to call back or seek an in-person evaluation if the symptoms worsen or if the condition fails to improve as anticipated.  I provided 15 minutes of non-face-to-face time during this encounter.  Meta Hatchet, PA   04/25/2023 6:44 PM [name] [name]  MRN:  [id number]  Chief Complaint:  Chief Complaint  Patient presents with   Follow-up   Medication Management   HPI:   [name] [name] is a 67 year old female with a past psychiatric history significant for insomnia, generalized anxiety disorder, and major depressive disorder who presents to [facility] via virtual video visit for follow-up and medication management.  Patient is currently being managed on the following medications:  Doxepin (Sinequan) 50 mg at bedtime Buspirone 15 mg 3 times daily in the morning/1 tablet at noon/1 tablet in the evening  Escitalopram 30 mg daily Clonazepam 0.5 mg 2 times daily as needed Mirtazapine 30 mg at bedtime  Patient presents to the encounter stating that she has been having issues with sleep.  Patient reports that she has been waking up at 3:30 AM to 4:30 AM and is unable to go back to sleep.  She reports that the  sleep that she has been going on for 2 to 3 weeks.  Despite her sleep issues, patient denies depression.  She endorses anxiety attributed to recently messing up her foot/ankle and is currently in a cast.  Patient denies any new stressors at this time.  A PHQ-9 screen was performed with the patient scoring a 5.  Patient is alert and oriented x4, pleasant, calm, cooperative, and fully engaged in conversation during the encounter.  Patient described her mood as tired.  Patient denies suicidal or homicidal ideations.  She further denies auditory or visual hallucinations and does not appear to be responding to internal/external stimuli.  Patient endorses fair sleep and receives on average 6 hours of intermittent sleep per night.  Patient endorses fair appetite and eats on average 2 meals per day.  Patient endorses alcohol consumption on occasion.  Patient endorses tobacco use and smokes on average a pack per day.  Patient denies illicit drug use.  Visit Diagnosis:    ICD-10-CM   1. Major depressive disorder, recurrent, in full remission with anxious distress (HCC)  F33.42 DISCONTINUED: doxepin (SINEQUAN) 75 MG capsule    DISCONTINUED: busPIRone (BUSPAR) 15 MG tablet    DISCONTINUED: mirtazapine (REMERON) 45 MG tablet    DISCONTINUED: escitalopram (LEXAPRO) 20 MG tablet    2. GAD (generalized anxiety disorder)  F41.1 DISCONTINUED: busPIRone (BUSPAR) 15 MG tablet    DISCONTINUED: mirtazapine (REMERON) 45 MG tablet    DISCONTINUED: escitalopram (LEXAPRO) 20 MG tablet    DISCONTINUED: clonazePAM (KLONOPIN) 0.5 MG tablet      Past Psychiatric  History:  Insomnia Major depressive disorder Generalized anxiety disorder  Past Medical History:  Past Medical History:  Diagnosis Date   Allergy    seasonal   Anemia    Anxiety    takes Lexapro daily   Arthritis    "back, from neck down pass my bra area" (03/25/2017)   Asthma    Bartholin gland cyst 08/29/2011   Bruises easily    pt is on Effient    Chronic back pain    herniated nucleus pulposus   Chronic back pain    "neck to bra area; lower back" (03/25/2017)   Chronic kidney disease    recurrent UTI's this year 2022   COPD (chronic obstructive pulmonary disease) (HCC)    early stages   Coronary artery disease    Depression    takes Klonopin daily   Diabetes mellitus without complication (HCC)    Diverticulosis    Fibroadenoma of left breast    GERD (gastroesophageal reflux disease)    takes Nexium daily   H/O hiatal hernia    Heart attack (HCC) 2011   Hemorrhoids    Hernia    Hyperlipidemia    takes Lipitor daily   Hypertension    takes Losartan daily and Labetalol bid   Hypothyroidism    takes Synthroid daily   Insomnia    hydroxyzine prn   Joint pain    Pneumonia    "couple times" (03/25/2017)   Pre-diabetes    "just found out 1 wk ago" (03/25/2017)   Psoriasis    elbows,knees,back   Shortness of breath    with exertion   Slowing of urinary stream    Stress incontinence     Past Surgical History:  Procedure Laterality Date   ABDOMINAL EXPLORATION SURGERY  1977   ABDOMINAL HYSTERECTOMY  1977   "left one of my ovaries"   BACK SURGERY     BIOPSY  05/27/2019   Procedure: BIOPSY;  Surgeon: Corbin Ade, MD;  Location: [facility];  Service: Endoscopy;;   BREAST BIOPSY Left 11/2021   x2   COLONOSCOPY  2011   Dr. Christella Hartigan: Mild diverticulosis, descending diminutive colon polyp (not retrieved), next colonoscopy 10 years   CORONARY ANGIOPLASTY WITH STENT PLACEMENT  2011 X2   "regular stents didn't work; had to go back in in ~ 1 month and put in medicated stents"   DILATION AND CURETTAGE OF UTERUS     ESOPHAGOGASTRODUODENOSCOPY     ESOPHAGOGASTRODUODENOSCOPY (EGD) WITH PROPOFOL N/A 05/27/2019   normal esophagus, dilation, erosive gastropathy s/p biopsy, normal duodenum. Negative H.pylori.    LEFT HEART CATH AND CORONARY ANGIOGRAPHY N/A 03/26/2017   Procedure: LEFT HEART CATH AND CORONARY ANGIOGRAPHY;   Surgeon: Lyn Records, MD;  Location: [facility] INVAS[facility] CV LAB;  Service: Cardiovascular;  Laterality: N/A;   LUMBAR LAMINECTOMY/DECOMPRESSION MICRODISCECTOMY  08/05/2011   Procedure: LUMBAR LAMINECTOMY/DECOMPRESSION MICRODISCECTOMY;  Surgeon: Clydene Fake, MD;  Location: [facility] NEURO ORS;  Service: Neurosurgery;  Laterality: Right;  Right Lumbar four-five extraforaminal discectomy   MALONEY DILATION N/A 05/27/2019   Procedure: Elease Hashimoto DILATION;  Surgeon: Corbin Ade, MD;  Location: [facility];  Service: Endoscopy;  Laterality: N/A;  54   SHOULDER ARTHROSCOPY WITH ROTATOR CUFF REPAIR AND OPEN BICEPS TENODESIS Right 12/24/2021   Procedure: RIGHT SHOULDER ARTHROSCOPY WITH ROTATOR CUFF REPAIR AND BICEPS TENODESIS;  Surgeon: Eldred Manges, MD;  Location: [facility] OR;  Service: Orthopedics;  Laterality: Right;   TONSILLECTOMY     as a  child   UPPER GASTROINTESTINAL ENDOSCOPY      Family Psychiatric History:  See intake H&P for full details. Reviewed, with no updates at this time.  Family History:  Family History  Problem Relation Age of Onset   Heart disease Mother    Hypertension Mother    Stroke Mother    Mental illness Mother    Heart disease Father    Hypertension Father    Diabetes Father    Colon polyps Brother    Heart disease Maternal Grandfather    Cancer Paternal Grandmother        mouth   Anesthesia problems Daughter    Breast cancer Maternal Aunt    Throat cancer Maternal Uncle    Thyroid disease Paternal Aunt    Hypotension Neg Hx    Malignant hyperthermia Neg Hx    Pseudochol deficiency Neg Hx    Colon cancer Neg Hx    Stomach cancer Neg Hx    Esophageal cancer Neg Hx    Pancreatic cancer Neg Hx    Rectal cancer Neg Hx     Social History:  Social History   Socioeconomic History   Marital status: Divorced    Spouse name: Not on file   Number of children: 1   Years of education: Not on file   Highest education level: 9th grade  Occupational History   Not on file   Tobacco Use   Smoking status: Every Day    Current packs/day: 0.50    Average packs/day: 0.5 packs/day for 50.0 years (25.0 ttl pk-yrs)    Types: Cigarettes   Smokeless tobacco: Never  Vaping Use   Vaping status: Some Days   Substances: Nicotine  Substance and Sexual Activity   Alcohol use: Yes    Comment: once a week   Drug use: Yes    Types: Marijuana    Comment: history of cocaine use, been about a year, per patient (12/18/21)   Sexual activity: Not Currently    Birth control/protection: Surgical  Other Topics Concern   Not on file  Social History Narrative   Not on file   Social Drivers of Health   Financial Resource Strain: Low Risk  (12/24/2022)   Overall Financial Resource Strain (CARDIA)    Difficulty of Paying Living Expenses: Not hard at all  Food Insecurity: No Food Insecurity (06/12/2023)   Hunger Vital Sign    Worried About Running Out of Food in the Last Year: Never true    Ran Out of Food in the Last Year: Never true  Transportation Needs: No Transportation Needs (06/12/2023)   PRAPARE - Administrator, Civil Service (Medical): No    Lack of Transportation (Non-Medical): No  Physical Activity: Inactive (12/24/2022)   Exercise Vital Sign    Days of Exercise per Week: 0 days    Minutes of Exercise per Session: 0 min  Stress: No Stress Concern Present (12/24/2022)   [employer] of Occupational Health - Occupational Stress Questionnaire    Feeling of Stress : Only a little  Social Connections: Moderately Isolated (12/24/2022)   Social Connection and Isolation Panel [NHANES]    Frequency of Communication with Friends and Family: More than three times a week    Frequency of Social Gatherings with Friends and Family: Three times a week    Attends Religious Services: 1 to 4 times per year    Active Member of Clubs or Organizations: No    Attends Banker Meetings: Never  Marital Status: Divorced    Allergies:  Allergies  Allergen  Reactions   Avocado Anaphylaxis   Latex Shortness Of Breath and Rash   Codeine Nausea Only    Metabolic Disorder Labs: Lab Results  Component Value Date   HGBA1C 5.7 01/17/2023   MPG 105.41 12/18/2021   MPG 119.76 10/05/2021   No results found for: "PROLACTIN" Lab Results  Component Value Date   CHOL 192 06/26/2023   TRIG 168 (H) 06/26/2023   HDL 71 06/26/2023   CHOLHDL 2.7 06/26/2023   VLDL 15 04/16/2016   LDLCALC 93 06/26/2023   LDLCALC 136 (H) 03/21/2022   Lab Results  Component Value Date   TSH 1.790 05/20/2023   TSH 3.180 09/10/2022    Therapeutic Level Labs: No results found for: "LITHIUM" No results found for: "VALPROATE" No results found for: "CBMZ"  Current Medications: Current Outpatient Medications  Medication Sig Dispense Refill   albuterol (VENTOLIN HFA) 108 (90 Base) MCG/ACT inhaler Inhale 2 puffs into the lungs every 6 (six) hours as needed for wheezing or shortness of breath. 18 g 12   alendronate (FOSAMAX) 35 MG tablet Take 1 tablet (35 mg total) by mouth every 7 (seven) days. Take with a full glass of water on an empty stomach. Sit upright for 30-60 mins after taking (Patient taking differently: Take 35 mg by mouth every Wednesday.) 12 tablet 1   amLODipine (NORVASC) 5 MG tablet Take 1 tablet (5 mg total) by mouth daily. 90 tablet 3   aspirin EC 81 MG tablet Take 81 mg by mouth daily.     atorvastatin (LIPITOR) 80 MG tablet Take 1 tablet (80 mg total) by mouth daily, please schedule appointment for more refills 90 tablet 3   Blood Glucose Monitoring Suppl (TRUE METRIX METER) DEVI 1 kit by Does not apply route 4 (four) times daily. 1 Device 0   busPIRone (BUSPAR) 15 MG tablet Take 2 tablets (30 mg total) by mouth in the morning AND 1 tablet (15 mg total) daily at 12 noon AND 1 tablet (15 mg total) every evening. 120 tablet 3   [START ON 07/12/2023] clonazePAM (KLONOPIN) 0.5 MG tablet Take 0.5 tablets (0.25 mg total) by mouth 2 (two) times daily as needed  for anxiety. 30 tablet 0   doxepin (SINEQUAN) 75 MG capsule Take 1 capsule (75 mg total) by mouth at bedtime. 30 capsule 3   escitalopram (LEXAPRO) 20 MG tablet Take 1 tablet (20 mg total) by mouth daily. 30 tablet 3   Fluticasone-Umeclidin-Vilant (TRELEGY ELLIPTA) 100-62.5-25 MCG/ACT AEPB Inhale 1 puff into the lungs daily. 60 each 10   furosemide (LASIX) 20 MG tablet Take 1 tablet (20 mg total) by mouth daily. 90 tablet 3   gabapentin (NEURONTIN) 600 MG tablet TAKE 1 TABLET BY MOUTH 4 TIMES DAILY 360 tablet 2   glucose blood (TRUE METRIX BLOOD GLUCOSE TEST) test strip Use as instructed 100 each 12   guaiFENesin (MUCINEX) 600 MG 12 hr tablet Take 1 tablet (600 mg total) by mouth 2 (two) times daily for 14 days. 28 tablet 0   ipratropium-albuterol (DUONEB) 0.5-2.5 (3) MG/3ML SOLN Take 3 mLs by nebulization 3 (three) times daily for 14 days. 180 mL 0   ketorolac (ACULAR) 0.5 % ophthalmic solution Place 1 drop into the left eye 4 (four) times daily.     levothyroxine (SYNTHROID) 112 MCG tablet Take 1 tablet (112 mcg total) by mouth daily. 90 tablet 2   losartan (COZAAR) 50 MG tablet Take 1.5  tablets (75 mg total) by mouth daily. 135 tablet 3   metFORMIN (GLUCOPHAGE) 500 MG tablet Take 1 tablet (500 mg total) by mouth 2 (two) times daily with a meal. (Patient taking differently: Take 500 mg by mouth daily with lunch.) 180 tablet 1   mirtazapine (REMERON) 45 MG tablet Take 1 tablet (45 mg total) by mouth at bedtime. 30 tablet 3   montelukast (SINGULAIR) 10 MG tablet Take 1 tablet (10 mg total) by mouth at bedtime. 90 tablet 2   nebivolol (BYSTOLIC) 10 MG tablet Take 1 tablet (10 mg total) by mouth daily. 90 tablet 3   nicotine (NICODERM CQ - DOSED IN MG/24 HOURS) 14 mg/24hr patch Place 1 patch (14 mg total) onto the skin daily. 28 patch 0   nitroGLYCERIN (NITROSTAT) 0.4 MG SL tablet Place 1 tablet (0.4 mg total) under the tongue every 5 (five) minutes as needed for chest pain. 25 tablet 3   ofloxacin  (OCUFLOX) 0.3 % ophthalmic solution Place 1 drop into the left eye 4 (four) times daily.     omeprazole (PRILOSEC) 40 MG capsule Take 1 capsule (40 mg total) by mouth 2 (two) times daily before a meal. 60 capsule 5   prednisoLONE acetate (PRED FORTE) 1 % ophthalmic suspension Place 1 drop into the left eye 4 (four) times daily.     Spacer/Aero-Holding Chambers (AEROCHAMBER MV) inhaler Use as instructed 1 each 1   ticagrelor (BRILINTA) 60 MG TABS tablet Take 1 tablet (60 mg total) by mouth 2 (two) times daily. 60 tablet 52   triamcinolone (KENALOG) 0.025 % ointment Apply 1 Application topically 2 (two) times daily as needed. 80 g 0   TRUEPLUS LANCETS 28G MISC 28 g by Does not apply route QID. 120 each 2   No current facility-administered medications for this visit.     Musculoskeletal: Strength & Muscle Tone: Unable to assess due to telemedicine visit Gait & Station: Unable to assess due to telemedicine visit Patient leans: Unable to assess due to telemedicine visit  Psychiatric Specialty Exam: Review of Systems  Psychiatric/Behavioral:  Positive for sleep disturbance. Negative for dysphoric mood, hallucinations, self-injury and suicidal ideas. The patient is nervous/anxious. The patient is not hyperactive.     There were no vitals taken for this visit.There is no height or weight on file to calculate BMI.  General Appearance: Casual  Eye Contact:  Good  Speech:  Clear and Coherent and Normal Rate  Volume:  Normal  Mood:  Anxious  Affect:  Appropriate  Thought Process:  Coherent, Goal Directed, and Descriptions of Associations: Intact  Orientation:  Full (Time, Place, and Person)  Thought Content: WDL   Suicidal Thoughts:  No  Homicidal Thoughts:  No  Memory:  Immediate;   Good Recent;   Good Remote;   Good  Judgement:  Fair  Insight:  Fair  Psychomotor Activity:  Normal  Concentration:  Concentration: Good and Attention Span: Good  Recall:  [employer] of Knowledge: Fair   Language: Fair  Akathisia:  Negative  Handed:  Right  AIMS (if indicated): not done  Assets:  Communication Skills Desire for Improvement Housing  ADL's:  Intact  Cognition: WNL  Sleep:  Fair   Screenings: AUDIT    Flowsheet Row [facility]  Alcohol Use Disorder Identification Test Final Score (AUDIT) 12      GAD-7    Flowsheet Row Video Visit from 04/25/2023 in [facility] Video Visit from 03/14/2023 in [facility] Office Visit from 01/17/2023 in [facility] [place]. [facility] Video Visit from 12/13/2022 in [facility] Video Visit from 09/20/2022 in [facility]  Total GAD-7 Score 0 9 0 3 8      PHQ2-9    Flowsheet Row Video Visit from 04/25/2023 in [facility] Video Visit from 03/14/2023 in [facility] Office Visit from 01/17/2023 in [facility] [place]. [facility] Clinical Support from 12/24/2022 in [facility]. [facility] Video Visit from 12/13/2022 in [facility]  PHQ-2 Total Score 2 2 0 0 0  PHQ-9 Total Score 5 8 1  -- --      Flowsheet Row Video Visit from 04/25/2023 in [facility] ED from 04/08/2023 in [facility] at [facility] Video Visit from 03/14/2023 in [facility]  C-SSRS RISK CATEGORY No Risk No Risk No Risk        Assessment and Plan:   [name] [name] is a 67 year old female with a past psychiatric history significant for insomnia, generalized anxiety disorder, and major depressive disorder who presents to [facility] via virtual  video visit for follow-up and medication management.  Patient presents to the encounter stating that she has been having issues with her sleep most recently.  She reports that she often wakes up at 3:30 AM or 4:30 AM in the morning and is unable to fall back asleep.  She denies overt depressive symptoms at this time but continues to endorse anxiety attributed to recently messing up her ankle/foot.  Provider recommended increasing her mirtazapine dosage from 30 mg to 45 mg at bedtime for the management of her sleep and for mood stability.  Patient was agreeable to recommendation.  Patient's medications to be e-prescribed to pharmacy of choice.  Collaboration of Care: Collaboration of Care: Medication Management AEB patient's medications being managed by this provider and Psychiatrist AEB patient being managed by this behavioral health provider  Patient/Guardian was advised Release of Information must be obtained prior to any record release in order to collaborate their care with an outside provider. Patient/Guardian was advised if they have not already done so to contact the registration department to sign all necessary forms in order for [place] to release information regarding their care.   Consent: Patient/Guardian gives verbal consent for treatment and assignment of benefits for services provided during this visit. Patient/Guardian expressed understanding and agreed to proceed.   1. Major depressive disorder, recurrent, in full remission with anxious distress (HCC)  - doxepin (SINEQUAN) 75 MG capsule; Take 1 capsule (75 mg total) by mouth at bedtime.  Dispense: 30 capsule; Refill: 3 - busPIRone (BUSPAR) 15 MG tablet; Take 2 tablets (30 mg total) by mouth in the morning AND 1 tablet (15 mg total) daily at 12 noon AND 1 tablet (15 mg total) every evening.  Dispense: 120 tablet; Refill: 3 - mirtazapine (REMERON) 45 MG tablet; Take 1 tablet (45 mg total) by mouth at bedtime.  Dispense: 30 tablet; Refill: 3 -  escitalopram (LEXAPRO) 20 MG tablet; Take 1 tablet (20 mg total) by mouth daily.  Dispense: 30 tablet; Refill: 3  2. GAD (generalized anxiety disorder)  -  busPIRone (BUSPAR) 15 MG tablet; Take 2 tablets (30 mg total) by mouth in the morning AND 1 tablet (15 mg total) daily at 12 noon AND 1 tablet (15 mg total) every evening.  Dispense: 120 tablet; Refill: 3 - mirtazapine (REMERON) 45 MG tablet; Take 1 tablet (45 mg total) by mouth at bedtime.  Dispense: 30 tablet; Refill: 3 - escitalopram (LEXAPRO) 20 MG tablet; Take 1 tablet (20 mg total) by mouth daily.  Dispense: 30 tablet; Refill: 3 - clonazePAM (KLONOPIN) 0.5 MG tablet; Take 1 tablet (0.5 mg total) by mouth 2 (two) times daily as needed for anxiety.  Dispense: 60 tablet; Refill: 1  Patient to follow up in 2 months Provider spent a total of 15 minutes with the patient/reviewing patient's chart  Meta Hatchet, PA 04/25/2023, 6:44 PM

## 2023-07-04 ENCOUNTER — Other Ambulatory Visit (HOSPITAL_COMMUNITY): Payer: Self-pay | Admitting: Physician Assistant

## 2023-07-04 ENCOUNTER — Other Ambulatory Visit: Payer: Self-pay

## 2023-07-04 ENCOUNTER — Encounter: Payer: Self-pay | Admitting: Physician Assistant

## 2023-07-04 ENCOUNTER — Ambulatory Visit: Payer: Medicare HMO | Attending: Physician Assistant | Admitting: Physician Assistant

## 2023-07-04 VITALS — BP 123/71 | HR 76 | Ht 59.0 in | Wt 148.0 lb

## 2023-07-04 DIAGNOSIS — E039 Hypothyroidism, unspecified: Secondary | ICD-10-CM | POA: Diagnosis not present

## 2023-07-04 DIAGNOSIS — E785 Hyperlipidemia, unspecified: Secondary | ICD-10-CM | POA: Diagnosis not present

## 2023-07-04 DIAGNOSIS — Z79899 Other long term (current) drug therapy: Secondary | ICD-10-CM

## 2023-07-04 DIAGNOSIS — J9601 Acute respiratory failure with hypoxia: Secondary | ICD-10-CM | POA: Diagnosis not present

## 2023-07-04 DIAGNOSIS — I2510 Atherosclerotic heart disease of native coronary artery without angina pectoris: Secondary | ICD-10-CM | POA: Diagnosis not present

## 2023-07-04 DIAGNOSIS — F419 Anxiety disorder, unspecified: Secondary | ICD-10-CM | POA: Diagnosis not present

## 2023-07-04 DIAGNOSIS — I5032 Chronic diastolic (congestive) heart failure: Secondary | ICD-10-CM | POA: Diagnosis not present

## 2023-07-04 DIAGNOSIS — J449 Chronic obstructive pulmonary disease, unspecified: Secondary | ICD-10-CM | POA: Diagnosis not present

## 2023-07-04 DIAGNOSIS — F411 Generalized anxiety disorder: Secondary | ICD-10-CM

## 2023-07-04 DIAGNOSIS — I10 Essential (primary) hypertension: Secondary | ICD-10-CM

## 2023-07-04 DIAGNOSIS — F32A Depression, unspecified: Secondary | ICD-10-CM | POA: Diagnosis not present

## 2023-07-04 DIAGNOSIS — I110 Hypertensive heart disease with heart failure: Secondary | ICD-10-CM | POA: Diagnosis not present

## 2023-07-04 DIAGNOSIS — R7303 Prediabetes: Secondary | ICD-10-CM | POA: Diagnosis not present

## 2023-07-04 DIAGNOSIS — J441 Chronic obstructive pulmonary disease with (acute) exacerbation: Secondary | ICD-10-CM | POA: Diagnosis not present

## 2023-07-04 MED ORDER — EZETIMIBE 10 MG PO TABS
10.0000 mg | ORAL_TABLET | Freq: Every day | ORAL | 3 refills | Status: DC
  Filled 2023-07-04: qty 90, 90d supply, fill #0
  Filled 2023-09-29: qty 90, 90d supply, fill #1
  Filled 2023-12-27: qty 90, 90d supply, fill #2
  Filled 2024-03-26: qty 90, 90d supply, fill #3

## 2023-07-04 NOTE — Progress Notes (Unsigned)
Cardiology Office Note:  .   Date:  07/05/2023  ID:  SHAWNDELL SALIB, DOB Apr 26, 1956, MRN [id number] PCP: [name], MD  [facility] Providers Cardiologist:  Nicki Guadalajara, MD     History of Present Illness: [name] [name] is a 67 y.o. female with PMH of HTN, HLD, prediabetes, CAD, illicit drug use (crack), hypothyroidism, psoriasis, tobacco abuse and COPD. She had a history of DES to RCA in 2001 with staged intervention to proximal and mid LAD with bare metal stents, cutting balloon atherotomy to ostial diagonal. 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 She was transitioned from Plavix to Effient. She presented to the hospital on 03/25/2017 with chest discomfort. Initial troponin was negative, however her symptom was concerning for angina. Initial EKG also showed prolonged QTC, she was advised to see her psychiatrist as outpatient to transition from Lexapro to another medication that does not prolonged QT interval. She eventually underwent a cardiac catheterization on 03/26/2017 by Dr. Katrinka Blazing which showed chronic total occlusion of RCA due to the in-stent restenosis with left-to-right collaterals, diffuse 50% narrowing in proximal LAD, diffuse 40-50% in-stent restenosis in the first diagonal, 70% mid OM 2, 50% proximal OM 3 stenosis, EF 65%. Aggressive medical therapy was recommended. Echocardiogram obtained on the same day showed EF 55-60%, grade 1 DD, no RWMA. She was unable to get outpatient assistance for Effient, she appears to be a hypo-responder to Plavix as she had in-stent restenosis in the past with Plavix, therefore she was switched to Brilinta.  Myoview in May 2021 was low risk with normal perfusion.  Despite her significant COPD, she continued to smoke.  She was admitted in late November 2024 with COPD exacerbation.  She was ultimately discharged on 2 L as needed oxygen.  Echocardiogram obtained on 06/26/2023 demonstrated EF 60 to  65%, no regional wall motion abnormality, normal RV, no significant valve issue.  A PET stress test was ordered, however this was scheduled for February.  Patient presents today for follow-up.  She continues to smoke at this time.  She lives by herself.  We had a long discussion about the importance of tobacco cessation.  She is scheduled to see Dr. Francine Graven of pulmonology service in February.  Her PET stress test is also scheduled for February as well.  Recent blood work showed LDL elevated, triglyceride borderline high.  Dr. Tresa Endo recommended addition of Zetia 10 mg daily.  We will reassess fasting lipid panel at [facility] in 3 months.  Patient can follow-up with Dr. Tresa Endo in 5 to 6 months.  ROS:   She denies chest pain, palpitations, dyspnea, pnd, orthopnea, n, v, dizziness, syncope, edema, weight gain, or early satiety. All other systems reviewed and are otherwise negative except as noted above.    Studies Reviewed: .        Cardiac Studies & Procedures   CARDIAC CATHETERIZATION  CARDIAC CATHETERIZATION 03/26/2017  Narrative  Chronic total occlusion of the right coronary due to in-stent restenosis. Left-to-right collaterals are noted.  Diffuse 50% narrowing of the proximal LAD stent. Diffuse 40-50% in-stent restenosis of the first diagonal.  70% mid second obtuse marginal stenosis.  50% proximal third obtuse marginal stenosis.  Widely patent left main coronary artery.  Normal left ventricular systolic function, EF 65%, with EDP 5 mmHg.  RECOMMENDATIONS:   Aggressive risk factor modification  Care management to help with acquiring medication.  Findings Coronary Findings Diagnostic  Dominance: Co-dominant  Left Anterior Descending The lesion was previously treated.  First Diagonal Branch Vessel is small in size.  The lesion was previously treated.  Ramus Intermedius Vessel is small.  Left Circumflex  First Obtuse Marginal Branch Vessel is small in size.  Second  Obtuse Marginal Branch  Third Obtuse Marginal Branch Vessel is small in size.  Right Coronary Artery The lesion was previously treated.  Acute Marginal Branch Vessel is small in size.  Right Posterior Descending Artery Vessel is small in size. Collaterals RPDA filled by collaterals from 1st Sept.  Right Posterior Atrioventricular Artery Vessel is small in size.  Intervention  No interventions have been documented.   STRESS TESTS  MYOCARDIAL PERFUSION IMAGING 11/25/2019  Narrative  Nuclear stress EF: 69%.  The left ventricular ejection fraction is hyperdynamic (>65%).  There was no ST segment deviation noted during stress.  The study is normal.  This is a low risk study.  ECHOCARDIOGRAM  ECHOCARDIOGRAM COMPLETE 06/26/2023  Narrative ECHOCARDIOGRAM REPORT    Patient Name:   [name] [name] Date of Exam: 06/26/2023 Medical Rec #:  [id number]     Height:       58.0 in Accession #:    [id number]    Weight:       166.4 lb Date of Birth:  [birth year]     BSA:          1.684 m Patient Age:    78 years      BP:           143/72 mmHg Patient Gender: F             HR:           62 bpm. Exam Location:  [address]  Procedure: 2D Echo, Cardiac Doppler and Color Doppler  Indications:    CAD Native Vessel I25.10  History:        Patient has prior history of Echocardiogram examinations, most recent 03/25/2022. CAD and Previous Myocardial Infarction, COPD, Signs/Symptoms:Shortness of Breath; Risk Factors:Hypertension, Current Smoker, Diabetes and Dyslipidemia.  Sonographer:    Eulah Pont RDCS Referring Phys: [phone] THOMAS A KELLY  IMPRESSIONS   1. Left ventricular ejection fraction, by estimation, is 60 to 65%. Left ventricular ejection fraction by PLAX is 73 %. The left ventricle has normal function. The left ventricle has no regional wall motion abnormalities. Left ventricular diastolic parameters were normal. 2. Right ventricular systolic function is normal.  The right ventricular size is normal. Tricuspid regurgitation signal is inadequate for assessing PA pressure. 3. The mitral valve is grossly normal. No evidence of mitral valve regurgitation. 4. The aortic valve is tricuspid. Aortic valve regurgitation is not visualized. 5. The inferior vena cava is normal in size with greater than 50% respiratory variability, suggesting right atrial pressure of 3 mmHg.  Comparison(s): No significant change from prior study. 03/25/2022: LVEF 60-65%.  FINDINGS Left Ventricle: Left ventricular ejection fraction, by estimation, is 60 to 65%. Left ventricular ejection fraction by PLAX is 73 %. The left ventricle has normal function. The left ventricle has no regional wall motion abnormalities. The left ventricular internal cavity size was normal in size. There is no left ventricular hypertrophy. Left ventricular diastolic parameters were normal.  Right Ventricle: The right ventricular size is normal. No increase in right ventricular wall thickness. Right ventricular systolic function is normal. Tricuspid regurgitation signal is inadequate for assessing PA pressure.  Left Atrium: Left atrial size was normal in size.  Right Atrium: Right atrial size was normal  in size.  Pericardium: There is no evidence of pericardial effusion.  Mitral Valve: The mitral valve is grossly normal. No evidence of mitral valve regurgitation.  Tricuspid Valve: The tricuspid valve is normal in structure. Tricuspid valve regurgitation is not demonstrated.  Aortic Valve: The aortic valve is tricuspid. Aortic valve regurgitation is not visualized.  Pulmonic Valve: The pulmonic valve was normal in structure. Pulmonic valve regurgitation is not visualized.  Aorta: The aortic root and ascending aorta are structurally normal, with no evidence of dilitation.  Venous: The inferior vena cava is normal in size with greater than 50% respiratory variability, suggesting right atrial pressure of 3  mmHg.  IAS/Shunts: No atrial level shunt detected by color flow Doppler.   LEFT VENTRICLE PLAX 2D LV EF:         Left            Diastology ventricular     LV e' medial:    8.31 cm/s ejection        LV E/e' medial:  9.6 fraction by     LV e' lateral:   9.34 cm/s PLAX is 73      LV E/e' lateral: 8.5 %. LVIDd:         4.35 cm LVIDs:         2.55 cm LV PW:         0.85 cm LV IVS:        0.85 cm LVOT diam:     1.80 cm LV SV:         63 LV SV Index:   38 LVOT Area:     2.54 cm  LV Volumes (MOD) LV vol d, MOD    49.1 ml A2C: LV vol d, MOD    54.0 ml A4C: LV vol s, MOD    20.7 ml A2C: LV vol s, MOD    22.7 ml A4C: LV SV MOD A2C:   28.4 ml LV SV MOD A4C:   54.0 ml LV SV MOD BP:    30.1 ml  RIGHT VENTRICLE RV S prime:     12.40 cm/s TAPSE (M-mode): 2.3 cm  LEFT ATRIUM             Index        RIGHT ATRIUM           Index LA diam:        3.30 cm 1.96 cm/m   RA Area:     11.80 cm LA Vol (A2C):   33.3 ml 19.77 ml/m  RA Volume:   24.60 ml  14.60 ml/m LA Vol (A4C):   27.6 ml 16.39 ml/m LA Biplane Vol: 30.8 ml 18.29 ml/m AORTIC VALVE LVOT Vmax:   107.00 cm/s LVOT Vmean:  68.300 cm/s LVOT VTI:    0.249 m  AORTA Ao Root diam: 2.90 cm Ao Asc diam:  3.20 cm  MV E velocity: 79.60 cm/s MV A velocity: 69.40 cm/s  SHUNTS MV E/A ratio:  1.15        Systemic VTI:  0.25 m Systemic Diam: 1.80 cm  Zoila Shutter MD Electronically signed by Zoila Shutter MD Signature Date/Time: 06/26/2023/7:03:50 PM    Final             Risk Assessment/Calculations:            Physical Exam:   VS:  BP 123/71 (BP Location: Left Arm, Cuff Size: Normal)   Pulse 76   Ht 4\' 11"  (1.499 m)   Wt  148 lb (67.1 kg)   SpO2 92%   BMI 29.89 kg/m    Wt Readings from Last 3 Encounters:  07/04/23 148 lb (67.1 kg)  06/20/23 166 lb 7.2 oz (75.5 kg)  05/28/23 147 lb 9.6 oz (67 kg)    GEN: Well nourished, well developed in no acute distress NECK: No JVD; No carotid bruits CARDIAC: RRR, no  murmurs, rubs, gallops RESPIRATORY:  Clear to auscultation without rales, wheezing or rhonchi  ABDOMEN: Soft, non-tender, non-distended EXTREMITIES:  No edema; No deformity   ASSESSMENT AND PLAN: .     Coronary Artery Disease (CAD) History of multiple interventions. Echocardiogram shows normal ejection fraction. PET stress test scheduled for February. -Continue current cardiac medications. -Keep scheduled PET stress test in February. -Follow-up with Dr. Tresa Endo in 5-6 months, pending results of stress test and cholesterol management.  Chronic Obstructive Pulmonary Disease (COPD) Recent hospitalization for COPD exacerbation. Currently on as-needed oxygen at home. Continued tobacco use despite significant COPD. -Encouraged smoking cessation and discussed potential consequences of continued smoking. -Continue follow-up with pulmonologist Dr. Francine Graven in February.  Hypertension: Blood pressure stable  Hyperlipidemia Elevated LDL despite Lipitor 80mg  daily. Triglycerides slightly elevated. -Add Zetia 10mg  daily. -Repeat fasting lipoprotein and liver function tests in 3 months.       Dispo: Follow-up with Dr. Tresa Endo in 22-month  Signed, Azalee Course, [place]

## 2023-07-04 NOTE — Patient Instructions (Signed)
Medication Instructions:  START ZETIA 10 MG DAILY  *If you need a refill on your cardiac medications before your next appointment, please call your pharmacy*   Lab Work: FASTING LIPID AND LFT IN 3 MONTHS If you have labs (blood work) drawn today and your tests are completely normal, you will receive your results only by: MyChart Message (if you have MyChart) OR A paper copy in the mail If you have any lab test that is abnormal or we need to change your treatment, we will call you to review the results.   Testing/Procedures: NO TESTING   Follow-Up: At [facility], you and your health needs are our priority.  As part of our continuing mission to provide you with exceptional heart care, we have created designated Provider Care Teams.  These Care Teams include your primary Cardiologist (physician) and Advanced Practice Providers (APPs -  Physician Assistants and Nurse Practitioners) who all work together to provide you with the care you need, when you need it.  Your next appointment:   5-6 month(s)  Provider:   Nicki Guadalajara, MD

## 2023-07-07 ENCOUNTER — Other Ambulatory Visit: Payer: Self-pay

## 2023-07-08 ENCOUNTER — Other Ambulatory Visit: Payer: Self-pay | Admitting: Student

## 2023-07-09 ENCOUNTER — Other Ambulatory Visit (HOSPITAL_COMMUNITY): Payer: Self-pay

## 2023-07-10 ENCOUNTER — Other Ambulatory Visit: Payer: Self-pay

## 2023-07-11 ENCOUNTER — Inpatient Hospital Stay (HOSPITAL_COMMUNITY)
Admission: EM | Admit: 2023-07-11 | Discharge: 2023-07-15 | DRG: 190 | Disposition: A | Discharge status: Home Health | Payer: Medicare HMO | Attending: Internal Medicine | Admitting: Internal Medicine

## 2023-07-11 ENCOUNTER — Ambulatory Visit: Payer: Self-pay | Admitting: *Deleted

## 2023-07-11 ENCOUNTER — Emergency Department (HOSPITAL_COMMUNITY): Payer: Medicare HMO

## 2023-07-11 DIAGNOSIS — U071 COVID-19: Secondary | ICD-10-CM

## 2023-07-11 DIAGNOSIS — I110 Hypertensive heart disease with heart failure: Secondary | ICD-10-CM | POA: Diagnosis not present

## 2023-07-11 DIAGNOSIS — F419 Anxiety disorder, unspecified: Secondary | ICD-10-CM | POA: Diagnosis present

## 2023-07-11 DIAGNOSIS — R5383 Other fatigue: Secondary | ICD-10-CM | POA: Diagnosis not present

## 2023-07-11 DIAGNOSIS — Z833 Family history of diabetes mellitus: Secondary | ICD-10-CM

## 2023-07-11 DIAGNOSIS — I2510 Atherosclerotic heart disease of native coronary artery without angina pectoris: Secondary | ICD-10-CM | POA: Diagnosis not present

## 2023-07-11 DIAGNOSIS — Z7983 Long term (current) use of bisphosphonates: Secondary | ICD-10-CM | POA: Diagnosis not present

## 2023-07-11 DIAGNOSIS — Z91040 Latex allergy status: Secondary | ICD-10-CM

## 2023-07-11 DIAGNOSIS — Z7951 Long term (current) use of inhaled steroids: Secondary | ICD-10-CM

## 2023-07-11 DIAGNOSIS — R0602 Shortness of breath: Secondary | ICD-10-CM | POA: Diagnosis not present

## 2023-07-11 DIAGNOSIS — Z8349 Family history of other endocrine, nutritional and metabolic diseases: Secondary | ICD-10-CM

## 2023-07-11 DIAGNOSIS — J9601 Acute respiratory failure with hypoxia: Secondary | ICD-10-CM | POA: Diagnosis present

## 2023-07-11 DIAGNOSIS — Z79899 Other long term (current) drug therapy: Secondary | ICD-10-CM

## 2023-07-11 DIAGNOSIS — R062 Wheezing: Secondary | ICD-10-CM | POA: Diagnosis not present

## 2023-07-11 DIAGNOSIS — Z818 Family history of other mental and behavioral disorders: Secondary | ICD-10-CM

## 2023-07-11 DIAGNOSIS — Z8249 Family history of ischemic heart disease and other diseases of the circulatory system: Secondary | ICD-10-CM | POA: Diagnosis not present

## 2023-07-11 DIAGNOSIS — Z823 Family history of stroke: Secondary | ICD-10-CM

## 2023-07-11 DIAGNOSIS — Z955 Presence of coronary angioplasty implant and graft: Secondary | ICD-10-CM

## 2023-07-11 DIAGNOSIS — Z803 Family history of malignant neoplasm of breast: Secondary | ICD-10-CM

## 2023-07-11 DIAGNOSIS — Z7982 Long term (current) use of aspirin: Secondary | ICD-10-CM

## 2023-07-11 DIAGNOSIS — Z860100 Personal history of colon polyps, unspecified: Secondary | ICD-10-CM

## 2023-07-11 DIAGNOSIS — Z885 Allergy status to narcotic agent status: Secondary | ICD-10-CM | POA: Diagnosis not present

## 2023-07-11 DIAGNOSIS — Z83719 Family history of colon polyps, unspecified: Secondary | ICD-10-CM

## 2023-07-11 DIAGNOSIS — I5032 Chronic diastolic (congestive) heart failure: Secondary | ICD-10-CM | POA: Diagnosis present

## 2023-07-11 DIAGNOSIS — F32A Depression, unspecified: Secondary | ICD-10-CM | POA: Diagnosis present

## 2023-07-11 DIAGNOSIS — J441 Chronic obstructive pulmonary disease with (acute) exacerbation: Secondary | ICD-10-CM | POA: Diagnosis not present

## 2023-07-11 DIAGNOSIS — Z79890 Hormone replacement therapy (postmenopausal): Secondary | ICD-10-CM

## 2023-07-11 DIAGNOSIS — R9431 Abnormal electrocardiogram [ECG] [EKG]: Secondary | ICD-10-CM | POA: Diagnosis present

## 2023-07-11 DIAGNOSIS — Z91018 Allergy to other foods: Secondary | ICD-10-CM | POA: Diagnosis not present

## 2023-07-11 DIAGNOSIS — Z7984 Long term (current) use of oral hypoglycemic drugs: Secondary | ICD-10-CM | POA: Diagnosis not present

## 2023-07-11 DIAGNOSIS — Z7902 Long term (current) use of antithrombotics/antiplatelets: Secondary | ICD-10-CM

## 2023-07-11 DIAGNOSIS — R069 Unspecified abnormalities of breathing: Secondary | ICD-10-CM | POA: Diagnosis not present

## 2023-07-11 DIAGNOSIS — E039 Hypothyroidism, unspecified: Secondary | ICD-10-CM | POA: Diagnosis not present

## 2023-07-11 DIAGNOSIS — Z9981 Dependence on supplemental oxygen: Secondary | ICD-10-CM

## 2023-07-11 DIAGNOSIS — R7303 Prediabetes: Secondary | ICD-10-CM | POA: Diagnosis present

## 2023-07-11 DIAGNOSIS — Z808 Family history of malignant neoplasm of other organs or systems: Secondary | ICD-10-CM

## 2023-07-11 DIAGNOSIS — J069 Acute upper respiratory infection, unspecified: Secondary | ICD-10-CM | POA: Diagnosis present

## 2023-07-11 DIAGNOSIS — B348 Other viral infections of unspecified site: Secondary | ICD-10-CM | POA: Diagnosis present

## 2023-07-11 LAB — CBC WITH DIFFERENTIAL/PLATELET
Abs Immature Granulocytes: 0.02 10*3/uL (ref 0.00–0.07)
Basophils Absolute: 0 10*3/uL (ref 0.0–0.1)
Basophils Relative: 1 %
Eosinophils Absolute: 0.1 10*3/uL (ref 0.0–0.5)
Eosinophils Relative: 2 %
HCT: 41.8 % (ref 36.0–46.0)
Hemoglobin: 13.4 g/dL (ref 12.0–15.0)
Immature Granulocytes: 0 %
Lymphocytes Relative: 25 %
Lymphs Abs: 1.5 10*3/uL (ref 0.7–4.0)
MCH: 32.4 pg (ref 26.0–34.0)
MCHC: 32.1 g/dL (ref 30.0–36.0)
MCV: 101 fL — ABNORMAL HIGH (ref 80.0–100.0)
Monocytes Absolute: 1.1 10*3/uL — ABNORMAL HIGH (ref 0.1–1.0)
Monocytes Relative: 19 %
Neutro Abs: 3.1 10*3/uL (ref 1.7–7.7)
Neutrophils Relative %: 53 %
Platelets: 376 10*3/uL (ref 150–400)
RBC: 4.14 MIL/uL (ref 3.87–5.11)
RDW: 14.7 % (ref 11.5–15.5)
WBC: 5.8 10*3/uL (ref 4.0–10.5)
nRBC: 0 % (ref 0.0–0.2)

## 2023-07-11 LAB — BASIC METABOLIC PANEL
Anion gap: 10 (ref 5–15)
BUN: 11 mg/dL (ref 8–23)
CO2: 28 mmol/L (ref 22–32)
Calcium: 8.5 mg/dL — ABNORMAL LOW (ref 8.9–10.3)
Chloride: 102 mmol/L (ref 98–111)
Creatinine, Ser: 1.19 mg/dL — ABNORMAL HIGH (ref 0.44–1.00)
GFR, Estimated: 50 mL/min — ABNORMAL LOW (ref >60)
Glucose, Bld: 77 mg/dL (ref 70–99)
Potassium: 3.8 mmol/L (ref 3.5–5.1)
Sodium: 140 mmol/L (ref 135–145)

## 2023-07-11 MED ORDER — IPRATROPIUM-ALBUTEROL 0.5-2.5 (3) MG/3ML IN SOLN
3.0000 mL | Freq: Once | RESPIRATORY_TRACT | Status: AC
  Administered 2023-07-11: 3 mL via RESPIRATORY_TRACT
  Filled 2023-07-11: qty 3

## 2023-07-11 NOTE — ED Provider Triage Note (Signed)
Emergency Medicine Provider Triage Evaluation Note  [name] [name] , a 67 y.o. female  was evaluated in triage.  Pt complains of SOB.  Review of Systems  Positive:  Negative:   Physical Exam  BP (!) 101/55 (BP Location: Left Arm)   Pulse 65   Temp 98.5 F (36.9 C) (Oral)   Resp 18   SpO2 100%  Gen:   Awake, no distress   Resp:  Normal effort  MSK:   Moves extremities without difficulty  Other:    Medical Decision Making  Medically screening exam initiated at 3:52 PM.  Appropriate orders placed.  [name] [name] was informed that the remainder of the evaluation will be completed by another provider, this initial triage assessment does not replace that evaluation, and the importance of remaining in the ED until their evaluation is complete.  Hx COPD. Tested positive for COVID recently. Concerned for dizziness and SOB that has been progressing since Monday. Also with rhinorrhea, congestion, sore throat, headache.     Dorthy Cooler, [place] 07/11/23 1554

## 2023-07-11 NOTE — ED Notes (Signed)
Patient transported to X-ray 

## 2023-07-11 NOTE — Telephone Encounter (Signed)
Marcelino Duster, PT from [facility] calling while with patient. Chief Complaint: SOB with O2 at 2L/min [place], chest tightness s/p influenza Symptoms: cough , thick yellow sputum, congestion, chest tightness, SOB at rest. Wearing oxygen more often than normal . O2 sat at 92 % while on oxygen and drops to high 80's without oxygen on. Headache. Has been taking OTC medications with no relief. Patient is going to take covid test now. Frequency: Monday  Pertinent Negatives: Patient denies chest pain no difficulty breathing no fever reported  Disposition: [x] ED /[] Urgent Care (no appt availability in office) / [] Appointment(In office/virtual)/ []  [facility]/ [] Home Care/ [] Refused Recommended Disposition /[] [facility]/ []  Follow-up with PCP Additional Notes:   Recommended patient go to ED . If no transportation call 911. Marcelino Duster , PT and patient verbalized understanding.      Reason for Disposition  Patient sounds very sick or weak to the triager  Answer Assessment - Initial Assessment Questions 1. RESPIRATORY STATUS: "Describe your breathing?" (e.g., wheezing, shortness of breath, unable to speak, severe coughing)      SOB at rest wearing O2 at 2L/min [place] all of the time cough thick yellow sputum 2. ONSET: "When did this breathing problem begin?"      Monday  3. PATTERN "Does the difficult breathing come and go, or has it been constant since it started?"      Constant now  4. SEVERITY: "How bad is your breathing?" (e.g., mild, moderate, severe)    - MILD: No SOB at rest, mild SOB with walking, speaks normally in sentences, can lie down, no retractions, pulse < 100.    - MODERATE: SOB at rest, SOB with minimal exertion and prefers to sit, cannot lie down flat, speaks in phrases, mild retractions, audible wheezing, pulse 100-120.    - SEVERE: Very SOB at rest, speaks in single words, struggling to breathe, sitting hunched forward, retractions, pulse > 120      SOB mostly with exertion  but also some at rest. HR 72  5. RECURRENT SYMPTOM: "Have you had difficulty breathing before?" If Yes, ask: "When was the last time?" and "What happened that time?"      Yes recent hospital for influenza 6. CARDIAC HISTORY: "Do you have any history of heart disease?" (e.g., heart attack, angina, bypass surgery, angioplasty)      See hx  7. LUNG HISTORY: "Do you have any history of lung disease?"  (e.g., pulmonary embolus, asthma, emphysema)     Wears O2 prn 8. CAUSE: "What do you think is causing the breathing problem?"      Not sure  9. OTHER SYMPTOMS: "Do you have any other symptoms? (e.g., dizziness, runny nose, cough, chest pain, fever)     Chest tightness, wearing O2 at 2 L/min more often than normal. Cough productive thick yellow  sputum.  10. O2 SATURATION MONITOR:  "Do you use an oxygen saturation monitor (pulse oximeter) at home?" If Yes, ask: "What is your reading (oxygen level) today?" "What is your usual oxygen saturation reading?" (e.g., 95%)       02 % on 2 Liters drops to high 80's without oxygen  11. PREGNANCY: "Is there any chance you are pregnant?" "When was your last menstrual period?"       na 12. TRAVEL: "Have you traveled out of the country in the last month?" (e.g., travel history, exposures)       na  Protocols used: Breathing Difficulty-A-AH

## 2023-07-11 NOTE — ED Triage Notes (Signed)
Pt BIB EMS from home. Hx of COPD 3Lnc at all times.  This morning tested positive for COVID from home test. Pt Pt states SOB worsens with exertion. Pt states oxygen drops with any movement.   EMS 116/96 97%  66HR CBG 119

## 2023-07-11 NOTE — Telephone Encounter (Signed)
FYI, patient has been advised to go to ED.

## 2023-07-12 ENCOUNTER — Other Ambulatory Visit: Payer: Self-pay

## 2023-07-12 ENCOUNTER — Encounter (HOSPITAL_COMMUNITY): Payer: Self-pay | Admitting: Internal Medicine

## 2023-07-12 DIAGNOSIS — I2510 Atherosclerotic heart disease of native coronary artery without angina pectoris: Secondary | ICD-10-CM | POA: Diagnosis present

## 2023-07-12 DIAGNOSIS — F32A Depression, unspecified: Secondary | ICD-10-CM | POA: Diagnosis present

## 2023-07-12 DIAGNOSIS — Z91018 Allergy to other foods: Secondary | ICD-10-CM | POA: Diagnosis not present

## 2023-07-12 DIAGNOSIS — J441 Chronic obstructive pulmonary disease with (acute) exacerbation: Secondary | ICD-10-CM

## 2023-07-12 DIAGNOSIS — I110 Hypertensive heart disease with heart failure: Secondary | ICD-10-CM | POA: Diagnosis present

## 2023-07-12 DIAGNOSIS — J9601 Acute respiratory failure with hypoxia: Secondary | ICD-10-CM | POA: Diagnosis present

## 2023-07-12 DIAGNOSIS — Z9981 Dependence on supplemental oxygen: Secondary | ICD-10-CM | POA: Diagnosis not present

## 2023-07-12 DIAGNOSIS — Z79899 Other long term (current) drug therapy: Secondary | ICD-10-CM | POA: Diagnosis not present

## 2023-07-12 DIAGNOSIS — Z79890 Hormone replacement therapy (postmenopausal): Secondary | ICD-10-CM | POA: Diagnosis not present

## 2023-07-12 DIAGNOSIS — Z7983 Long term (current) use of bisphosphonates: Secondary | ICD-10-CM | POA: Diagnosis not present

## 2023-07-12 DIAGNOSIS — R9431 Abnormal electrocardiogram [ECG] [EKG]: Secondary | ICD-10-CM | POA: Diagnosis present

## 2023-07-12 DIAGNOSIS — Z7902 Long term (current) use of antithrombotics/antiplatelets: Secondary | ICD-10-CM | POA: Diagnosis not present

## 2023-07-12 DIAGNOSIS — Z7982 Long term (current) use of aspirin: Secondary | ICD-10-CM | POA: Diagnosis not present

## 2023-07-12 DIAGNOSIS — F419 Anxiety disorder, unspecified: Secondary | ICD-10-CM | POA: Diagnosis present

## 2023-07-12 DIAGNOSIS — R7303 Prediabetes: Secondary | ICD-10-CM | POA: Diagnosis present

## 2023-07-12 DIAGNOSIS — Z818 Family history of other mental and behavioral disorders: Secondary | ICD-10-CM | POA: Diagnosis not present

## 2023-07-12 DIAGNOSIS — Z955 Presence of coronary angioplasty implant and graft: Secondary | ICD-10-CM | POA: Diagnosis not present

## 2023-07-12 DIAGNOSIS — Z7984 Long term (current) use of oral hypoglycemic drugs: Secondary | ICD-10-CM | POA: Diagnosis not present

## 2023-07-12 DIAGNOSIS — I5032 Chronic diastolic (congestive) heart failure: Secondary | ICD-10-CM | POA: Diagnosis present

## 2023-07-12 DIAGNOSIS — Z823 Family history of stroke: Secondary | ICD-10-CM | POA: Diagnosis not present

## 2023-07-12 DIAGNOSIS — Z91040 Latex allergy status: Secondary | ICD-10-CM | POA: Diagnosis not present

## 2023-07-12 DIAGNOSIS — Z7951 Long term (current) use of inhaled steroids: Secondary | ICD-10-CM | POA: Diagnosis not present

## 2023-07-12 DIAGNOSIS — Z8249 Family history of ischemic heart disease and other diseases of the circulatory system: Secondary | ICD-10-CM | POA: Diagnosis not present

## 2023-07-12 DIAGNOSIS — Z885 Allergy status to narcotic agent status: Secondary | ICD-10-CM | POA: Diagnosis not present

## 2023-07-12 LAB — RESP PANEL BY RT-PCR (RSV, FLU A&B, COVID)  RVPGX2
Influenza A by PCR: NEGATIVE
Influenza B by PCR: NEGATIVE
Resp Syncytial Virus by PCR: NEGATIVE
SARS Coronavirus 2 by RT PCR: NEGATIVE

## 2023-07-12 LAB — BRAIN NATRIURETIC PEPTIDE: B Natriuretic Peptide: 20 pg/mL (ref 0.0–100.0)

## 2023-07-12 LAB — TROPONIN I (HIGH SENSITIVITY): Troponin I (High Sensitivity): 4 ng/L (ref <18)

## 2023-07-12 MED ORDER — TICAGRELOR 60 MG PO TABS
60.0000 mg | ORAL_TABLET | Freq: Two times a day (BID) | ORAL | Status: DC
  Administered 2023-07-12 – 2023-07-15 (×7): 60 mg via ORAL
  Filled 2023-07-12 (×7): qty 1

## 2023-07-12 MED ORDER — BUSPIRONE HCL 5 MG PO TABS
30.0000 mg | ORAL_TABLET | Freq: Every morning | ORAL | Status: DC
  Administered 2023-07-13 – 2023-07-15 (×3): 30 mg via ORAL
  Filled 2023-07-12 (×3): qty 6

## 2023-07-12 MED ORDER — IPRATROPIUM-ALBUTEROL 0.5-2.5 (3) MG/3ML IN SOLN
3.0000 mL | Freq: Four times a day (QID) | RESPIRATORY_TRACT | Status: DC
  Administered 2023-07-12 – 2023-07-15 (×12): 3 mL via RESPIRATORY_TRACT
  Filled 2023-07-12 (×13): qty 3

## 2023-07-12 MED ORDER — LEVOTHYROXINE SODIUM 112 MCG PO TABS
112.0000 ug | ORAL_TABLET | Freq: Every day | ORAL | Status: DC
  Administered 2023-07-13 – 2023-07-15 (×3): 112 ug via ORAL
  Filled 2023-07-12 (×3): qty 1

## 2023-07-12 MED ORDER — CLONAZEPAM 0.25 MG PO TBDP
0.2500 mg | ORAL_TABLET | Freq: Two times a day (BID) | ORAL | Status: DC
  Administered 2023-07-12 – 2023-07-15 (×7): 0.25 mg via ORAL
  Filled 2023-07-12 (×6): qty 1
  Filled 2023-07-12: qty 2

## 2023-07-12 MED ORDER — IPRATROPIUM-ALBUTEROL 0.5-2.5 (3) MG/3ML IN SOLN
3.0000 mL | RESPIRATORY_TRACT | Status: AC
Start: 2023-07-12 — End: 2023-07-12
  Administered 2023-07-12 (×3): 3 mL via RESPIRATORY_TRACT
  Filled 2023-07-12: qty 3
  Filled 2023-07-12: qty 9

## 2023-07-12 MED ORDER — NICOTINE 14 MG/24HR TD PT24
14.0000 mg | MEDICATED_PATCH | Freq: Every day | TRANSDERMAL | Status: DC
  Administered 2023-07-12 – 2023-07-15 (×4): 14 mg via TRANSDERMAL
  Filled 2023-07-12 (×4): qty 1

## 2023-07-12 MED ORDER — FUROSEMIDE 20 MG PO TABS
20.0000 mg | ORAL_TABLET | Freq: Every day | ORAL | Status: DC
  Administered 2023-07-12 – 2023-07-15 (×4): 20 mg via ORAL
  Filled 2023-07-12 (×4): qty 1

## 2023-07-12 MED ORDER — PREDNISONE 20 MG PO TABS
40.0000 mg | ORAL_TABLET | Freq: Every day | ORAL | Status: DC
Start: 2023-07-13 — End: 2023-07-13

## 2023-07-12 MED ORDER — ESCITALOPRAM OXALATE 20 MG PO TABS
20.0000 mg | ORAL_TABLET | Freq: Every day | ORAL | Status: DC
  Administered 2023-07-12 – 2023-07-15 (×4): 20 mg via ORAL
  Filled 2023-07-12 (×3): qty 1
  Filled 2023-07-12: qty 2

## 2023-07-12 MED ORDER — DOXEPIN HCL 25 MG PO CAPS
75.0000 mg | ORAL_CAPSULE | Freq: Every day | ORAL | Status: DC
  Administered 2023-07-12 – 2023-07-14 (×3): 75 mg via ORAL
  Filled 2023-07-12 (×2): qty 3
  Filled 2023-07-12: qty 1
  Filled 2023-07-12: qty 3

## 2023-07-12 MED ORDER — ATORVASTATIN CALCIUM 80 MG PO TABS
80.0000 mg | ORAL_TABLET | Freq: Every day | ORAL | Status: DC
  Administered 2023-07-12 – 2023-07-15 (×4): 80 mg via ORAL
  Filled 2023-07-12 (×2): qty 1
  Filled 2023-07-12: qty 2
  Filled 2023-07-12: qty 1

## 2023-07-12 MED ORDER — LOSARTAN POTASSIUM 50 MG PO TABS
75.0000 mg | ORAL_TABLET | Freq: Every day | ORAL | Status: DC
  Administered 2023-07-12 – 2023-07-15 (×4): 75 mg via ORAL
  Filled 2023-07-12 (×4): qty 1

## 2023-07-12 MED ORDER — AZITHROMYCIN 250 MG PO TABS
500.0000 mg | ORAL_TABLET | Freq: Every day | ORAL | Status: AC
  Administered 2023-07-12 – 2023-07-14 (×3): 500 mg via ORAL
  Filled 2023-07-12 (×3): qty 2

## 2023-07-12 MED ORDER — BUSPIRONE HCL 5 MG PO TABS
15.0000 mg | ORAL_TABLET | Freq: Every evening | ORAL | Status: DC
  Administered 2023-07-12 – 2023-07-14 (×3): 15 mg via ORAL
  Filled 2023-07-12 (×3): qty 3

## 2023-07-12 MED ORDER — AMLODIPINE BESYLATE 5 MG PO TABS
5.0000 mg | ORAL_TABLET | Freq: Every day | ORAL | Status: DC
  Administered 2023-07-12 – 2023-07-15 (×4): 5 mg via ORAL
  Filled 2023-07-12 (×4): qty 1

## 2023-07-12 MED ORDER — METHYLPREDNISOLONE SODIUM SUCC 125 MG IJ SOLR
125.0000 mg | Freq: Once | INTRAMUSCULAR | Status: AC
  Administered 2023-07-12: 125 mg via INTRAVENOUS
  Filled 2023-07-12: qty 2

## 2023-07-12 MED ORDER — MAGNESIUM SULFATE 2 GM/50ML IV SOLN
2.0000 g | Freq: Once | INTRAVENOUS | Status: AC
Start: 2023-07-12 — End: 2023-07-12
  Administered 2023-07-12: 2 g via INTRAVENOUS
  Filled 2023-07-12: qty 50

## 2023-07-12 MED ORDER — ASPIRIN 81 MG PO TBEC
81.0000 mg | DELAYED_RELEASE_TABLET | Freq: Every day | ORAL | Status: DC
  Administered 2023-07-12 – 2023-07-15 (×4): 81 mg via ORAL
  Filled 2023-07-12 (×4): qty 1

## 2023-07-12 MED ORDER — NEBIVOLOL HCL 10 MG PO TABS
10.0000 mg | ORAL_TABLET | Freq: Every day | ORAL | Status: DC
  Administered 2023-07-12 – 2023-07-15 (×4): 10 mg via ORAL
  Filled 2023-07-12 (×4): qty 1

## 2023-07-12 MED ORDER — ENOXAPARIN SODIUM 40 MG/0.4ML IJ SOSY
40.0000 mg | PREFILLED_SYRINGE | INTRAMUSCULAR | Status: DC
Start: 2023-07-12 — End: 2023-07-15
  Administered 2023-07-12 – 2023-07-15 (×4): 40 mg via SUBCUTANEOUS
  Filled 2023-07-12 (×4): qty 0.4

## 2023-07-12 MED ORDER — MONTELUKAST SODIUM 10 MG PO TABS
10.0000 mg | ORAL_TABLET | Freq: Every day | ORAL | Status: DC
  Administered 2023-07-12 – 2023-07-14 (×3): 10 mg via ORAL
  Filled 2023-07-12 (×3): qty 1

## 2023-07-12 MED ORDER — BUSPIRONE HCL 5 MG PO TABS
15.0000 mg | ORAL_TABLET | Freq: Every day | ORAL | Status: DC
  Administered 2023-07-12 – 2023-07-14 (×3): 15 mg via ORAL
  Filled 2023-07-12: qty 2
  Filled 2023-07-12 (×2): qty 3

## 2023-07-12 MED ORDER — BUDESONIDE 0.25 MG/2ML IN SUSP
0.2500 mg | Freq: Two times a day (BID) | RESPIRATORY_TRACT | Status: DC
  Administered 2023-07-12 – 2023-07-14 (×5): 0.25 mg via RESPIRATORY_TRACT
  Filled 2023-07-12 (×5): qty 2

## 2023-07-12 NOTE — H&P (Signed)
Date: 07/12/2023               Patient Name:  [name] [name] MRN: [id number]  DOB: Nov 20, 1955 Age / Sex: 67 y.o., female   PCP: [name], MD         Medical Service: Internal Medicine Teaching Service         Attending Physician: Dr. Reymundo Poll, MD      First Contact: Dr. Annett Fabian, MD Pager [phone]    Second Contact: Dr. Rudene Christians, DO Pager [phone]         After Hours (After 5p/  First Contact Pager: [phone]  weekends / holidays): Second Contact Pager: [phone]   SUBJECTIVE   Chief Complaint: shortness of breath, cough  History of Present Illness:  [name] [name] is a 67 year old with a past medical history of COPD, hypertension, prediabetes, CAD s/p stents, and anxiety who presents with generalized weakness, shortness of breath, and cough.  She was recently admitted at the end of November for a COPD exacerbation.  She was discharged with 2 L of home oxygen.  Since then, she reports that she has felt short of breath and generally weak.  She states she has been using her inhalers regularly including her Trelegy inhaler daily and her albuterol 4 times a day.  She notes that for the past week or so she has had an increased wet cough with yellowish thick sputum production, nasal congestion, and has generally felt ill.  Apparently her home health nurse did not home COVID test and it was positive, so she instructed her to come to the hospital. She has not required increases in her oxygen or inhaler use.  She denies any nausea, vomiting, diarrhea, fevers, or chills.  ED Course: She received 3 DuoNebs in the emergency department, in addition to Solu-Medrol and magnesium.  Her respiratory status improved with these interventions.  ECG was unremarkable.  Chest x-ray was unremarkable. BNP 20. Troponins negative. Respiratory panel negative.   Meds:  Current Meds  Medication Sig   albuterol (VENTOLIN HFA) 108 (90 Base) MCG/ACT inhaler Inhale 2 puffs into the lungs every 6  (six) hours as needed for wheezing or shortness of breath.   alendronate (FOSAMAX) 35 MG tablet Take 1 tablet (35 mg total) by mouth every 7 (seven) days. Take with a full glass of water on an empty stomach. Sit upright for 30-60 mins after taking (Patient taking differently: Take 35 mg by mouth every Wednesday.)   amLODipine (NORVASC) 5 MG tablet Take 1 tablet (5 mg total) by mouth daily.   aspirin EC 81 MG tablet Take 81 mg by mouth daily.   atorvastatin (LIPITOR) 80 MG tablet Take 1 tablet (80 mg total) by mouth daily, please schedule appointment for more refills   Blood Glucose Monitoring Suppl (TRUE METRIX METER) DEVI 1 kit by Does not apply route 4 (four) times daily.   busPIRone (BUSPAR) 15 MG tablet Take 2 tablets (30 mg total) by mouth in the morning AND 1 tablet (15 mg total) daily at 12 noon AND 1 tablet (15 mg total) every evening.   clonazePAM (KLONOPIN) 0.5 MG tablet Take 0.5 tablets (0.25 mg total) by mouth 2 (two) times daily as needed for anxiety.   doxepin (SINEQUAN) 75 MG capsule Take 1 capsule (75 mg total) by mouth at bedtime.   escitalopram (LEXAPRO) 20 MG tablet Take 1 tablet (20 mg total) by mouth daily.   ezetimibe (ZETIA) 10 MG tablet Take 1 tablet (10 mg  total) by mouth daily.   Fluticasone-Umeclidin-Vilant (TRELEGY ELLIPTA) 100-62.5-25 MCG/ACT AEPB Inhale 1 puff into the lungs daily.   furosemide (LASIX) 20 MG tablet Take 1 tablet (20 mg total) by mouth daily.   gabapentin (NEURONTIN) 600 MG tablet TAKE 1 TABLET BY MOUTH 4 TIMES DAILY (Patient taking differently: Take 600 mg by mouth 2 (two) times daily.)   glucose blood (TRUE METRIX BLOOD GLUCOSE TEST) test strip Use as instructed   levothyroxine (SYNTHROID) 112 MCG tablet Take 1 tablet (112 mcg total) by mouth daily.   losartan (COZAAR) 50 MG tablet Take 1.5 tablets (75 mg total) by mouth daily.   mirtazapine (REMERON) 45 MG tablet Take 1 tablet (45 mg total) by mouth at bedtime.   montelukast (SINGULAIR) 10 MG tablet  Take 1 tablet (10 mg total) by mouth at bedtime.   nebivolol (BYSTOLIC) 10 MG tablet Take 1 tablet (10 mg total) by mouth daily.   nicotine (NICODERM CQ - DOSED IN MG/24 HOURS) 14 mg/24hr patch Place 1 patch (14 mg total) onto the skin daily.   nitroGLYCERIN (NITROSTAT) 0.4 MG SL tablet Place 1 tablet (0.4 mg total) under the tongue every 5 (five) minutes as needed for chest pain.   omeprazole (PRILOSEC) 40 MG capsule Take 1 capsule (40 mg total) by mouth 2 (two) times daily before a meal.   Spacer/Aero-Holding Chambers (AEROCHAMBER MV) inhaler Use as instructed   ticagrelor (BRILINTA) 60 MG TABS tablet Take 1 tablet (60 mg total) by mouth 2 (two) times daily.   triamcinolone (KENALOG) 0.025 % ointment Apply 1 Application topically 2 (two) times daily as needed.   TRUEPLUS LANCETS 28G MISC 28 g by Does not apply route QID.    Past Medical History: COPD Hypertension Prediabetes CAD s/p stents anxiety   Past Surgical History: Past Surgical History:  Procedure Laterality Date   ABDOMINAL EXPLORATION SURGERY  1977   ABDOMINAL HYSTERECTOMY  1977   "left one of my ovaries"   BACK SURGERY     BIOPSY  05/27/2019   Procedure: BIOPSY;  Surgeon: Corbin Ade, MD;  Location: [facility];  Service: Endoscopy;;   BREAST BIOPSY Left 11/2021   x2   COLONOSCOPY  2011   Dr. Christella Hartigan: Mild diverticulosis, descending diminutive colon polyp (not retrieved), next colonoscopy 10 years   CORONARY ANGIOPLASTY WITH STENT PLACEMENT  2011 X2   "regular stents didn't work; had to go back in in ~ 1 month and put in medicated stents"   DILATION AND CURETTAGE OF UTERUS     ESOPHAGOGASTRODUODENOSCOPY     ESOPHAGOGASTRODUODENOSCOPY (EGD) WITH PROPOFOL N/A 05/27/2019   normal esophagus, dilation, erosive gastropathy s/p biopsy, normal duodenum. Negative H.pylori.    LEFT HEART CATH AND CORONARY ANGIOGRAPHY N/A 03/26/2017   Procedure: LEFT HEART CATH AND CORONARY ANGIOGRAPHY;  Surgeon: Lyn Records, MD;   Location: [facility];  Service: Cardiovascular;  Laterality: N/A;   LUMBAR LAMINECTOMY/DECOMPRESSION MICRODISCECTOMY  08/05/2011   Procedure: LUMBAR LAMINECTOMY/DECOMPRESSION MICRODISCECTOMY;  Surgeon: Clydene Fake, MD;  Location: [facility];  Service: Neurosurgery;  Laterality: Right;  Right Lumbar four-five extraforaminal discectomy   MALONEY DILATION N/A 05/27/2019   Procedure: Elease Hashimoto DILATION;  Surgeon: Corbin Ade, MD;  Location: [facility];  Service: Endoscopy;  Laterality: N/A;  54   SHOULDER ARTHROSCOPY WITH ROTATOR CUFF REPAIR AND OPEN BICEPS TENODESIS Right 12/24/2021   Procedure: RIGHT SHOULDER ARTHROSCOPY WITH ROTATOR CUFF REPAIR AND BICEPS TENODESIS;  Surgeon: Eldred Manges, MD;  Location:  [facility];  Service: Orthopedics;  Laterality: Right;   TONSILLECTOMY     as a child   UPPER GASTROINTESTINAL ENDOSCOPY     Social:  Lives With: alone, but her daughter is nearby and helps her Occupation: housekeeper Support: daughter Level of Function: independent with ADLs; depends on daughter for cooking, Pharmacologist, shopping. PCP: Dr. Jonah Blue  Substances: one pack per day of cigarettes since age 62; rarely drinks alcohol; no other drug use  Family History:  Family History  Problem Relation Age of Onset   Heart disease Mother    Hypertension Mother    Stroke Mother    Mental illness Mother    Heart disease Father    Hypertension Father    Diabetes Father    Colon polyps Brother    Heart disease Maternal Grandfather    Cancer Paternal Grandmother        mouth   Anesthesia problems Daughter    Breast cancer Maternal Aunt    Throat cancer Maternal Uncle    Thyroid disease Paternal Aunt    Hypotension Neg Hx    Malignant hyperthermia Neg Hx    Pseudochol deficiency Neg Hx    Colon cancer Neg Hx    Stomach cancer Neg Hx    Esophageal cancer Neg Hx    Pancreatic cancer Neg Hx    Rectal cancer Neg Hx    Allergies: Allergies as of 07/11/2023 - Review  Complete 07/04/2023  Allergen Reaction Noted   Avocado Anaphylaxis 09/15/2017   Latex Shortness Of Breath and Rash 12/22/2008   Codeine Nausea Only 12/22/2008   Review of Systems: A complete ROS was negative except as per HPI.   OBJECTIVE:   Physical Exam: Blood pressure 103/68, pulse 78, temperature 97.9 F (36.6 C), temperature source Oral, resp. rate 17, height 4\' 11"  (1.499 m), weight 67 kg, SpO2 98%.  Constitutional: ill-appearing but comfortable, no acute distress; on 2L [place] HENT: mucous membranes moist Cardiovascular: regular rate and rhythm, no m/r/g Pulmonary/Chest: diffuse wheezes bilaterally, worse on expiration; wet cough intermittently Neurological: alert & oriented x 3, no focal deficits  Labs: CBC    Component Value Date/Time   WBC 5.8 07/11/2023 1641   RBC 4.14 07/11/2023 1641   HGB 13.4 07/11/2023 1641   HGB 14.8 05/20/2023 1119   HCT 41.8 07/11/2023 1641   HCT 44.2 05/20/2023 1119   PLT 376 07/11/2023 1641   PLT 373 05/20/2023 1119   MCV 101.0 (H) 07/11/2023 1641   MCV 99 (H) 05/20/2023 1119   MCH 32.4 07/11/2023 1641   MCHC 32.1 07/11/2023 1641   RDW 14.7 07/11/2023 1641   RDW 13.2 05/20/2023 1119   LYMPHSABS 1.5 07/11/2023 1641   LYMPHSABS 2.6 04/05/2019 0926   MONOABS 1.1 (H) 07/11/2023 1641   EOSABS 0.1 07/11/2023 1641   EOSABS 0.3 04/05/2019 0926   BASOSABS 0.0 07/11/2023 1641   BASOSABS 0.1 04/05/2019 0926     CMP     Component Value Date/Time   NA 140 07/11/2023 1641   NA 142 06/26/2023 1339   K 3.8 07/11/2023 1641   CL 102 07/11/2023 1641   CO2 28 07/11/2023 1641   GLUCOSE 77 07/11/2023 1641   BUN 11 07/11/2023 1641   BUN 14 06/26/2023 1339   CREATININE 1.19 (H) 07/11/2023 1641   CREATININE 0.80 03/24/2023 1232   CREATININE 0.93 04/16/2016 0937   CALCIUM 8.5 (L) 07/11/2023 1641   PROT 6.2 06/26/2023 1339   ALBUMIN 4.0 06/26/2023 1339   AST  15 06/26/2023 1339   AST 19 03/24/2023 1232   ALT 37 (H) 06/26/2023 1339   ALT 23  03/24/2023 1232   ALKPHOS 85 06/26/2023 1339   BILITOT 0.4 06/26/2023 1339   BILITOT 0.4 03/24/2023 1232   GFRNONAA 50 (L) 07/11/2023 1641   GFRNONAA >60 03/24/2023 1232   GFRNONAA 67 04/16/2016 0937   GFRAA 77 04/05/2019 0926   GFRAA 78 04/16/2016 0937    Imaging: DG Chest 2 View CLINICAL DATA:  Shortness of breath.  EXAM: CHEST - 2 VIEW  COMPARISON:  06/13/2023.  FINDINGS: Bilateral lung fields are clear. Bilateral costophrenic angles are clear.  Normal cardio-mediastinal silhouette.  No acute osseous abnormalities.  Old healed and malunited left middle third clavicular fracture noted.  The soft tissues are within normal limits.  IMPRESSION: No active cardiopulmonary disease.  Electronically Signed   By: Jules Schick M.D.   On: 07/11/2023 17:29   ASSESSMENT & PLAN:   Assessment & Plan by Problem: Principal Problem:   COPD exacerbation (HCC)  [name] [name] is a 67 y.o. person living with a history of COPD, hypertension, prediabetes, CAD s/p stents, and anxiety who presented with dyspnea and generalized weakness and is admitted for COPD exacerbation on hospital day 0  Acute COPD exacerbation Consistent with her history of dyspnea, increased cough with sputum production, and diffuse wheezing on exam. States she has been using her inhalers regularly. Exacerbation likely secondary to viral URI. Symptomatic improvement with duonebs. Will continue duonebs and treat for COPD exacerbation with steroids and azithromycin.  - Prednisone 40 mg daily x 4 more days, starting tomorrow - Azithromycin 500 mg x 3 days - Duonebs q6h - Pulmicort BID; restarted singulair - Continue home O2, 2L [place]; goal O2 between 89-93%.  - Ambulation with pulse ox tomorrow  Chronic conditions: HFpEF: Continued home lasix 20 mg, nebivolol 10 mg daily.  CAD s/p stents: Continued home ticagrelor 60 mg daily, ASA 81 mg daily.  HTN: holding amlodipine 5 mg daily, losartan 75 mg  daily. Prediabetes: holding metformin 500 mg BID for now.  Anxiety/Depression: Continued home buspar TID, klonopin 0.25 mg BID, doxepin 75 mg at bedtime. Continued home lexapro 20 mg daily. Holding mirtazapine.  Hypothyroidism: TSH one month ago wnl. Continued home synthroid 112 mcg daily.   Diet: Heart Healthy VTE: Enoxaparin IVF: None Code: Full  Prior to Admission Living Arrangement: Home, living alone Anticipated Discharge Location: Home Barriers to Discharge: resolution of exacerbation and return to baseline respiratory status.   Dispo: Admit patient to Observation with expected length of stay less than 2 midnights.  Signed: Annett Fabian, MD Internal Medicine Resident, PGY-1 [name]  Pager: [phone]  07/12/2023, 1:05 PM

## 2023-07-12 NOTE — ED Notes (Signed)
ED TO INPATIENT HANDOFF REPORT  ED Nurse Name and Phone #: Murlean Iba Paramedic [phone]  S Name/Age/Gender [name] [name] 67 y.o. female Room/Bed: 026C/026C  Code Status   Code Status: Full Code  Home/SNF/Other Home Patient oriented to: self, place, and time Is this baseline? Yes   Triage Complete: Triage complete  Chief Complaint COPD exacerbation (HCC) [J44.1]  Triage Note Pt BIB EMS from home. Hx of COPD 3Lnc at all times.  This morning tested positive for COVID from home test. Pt Pt states SOB worsens with exertion. Pt states oxygen drops with any movement.   EMS 116/96 97%  66HR CBG 119   Allergies Allergies  Allergen Reactions   Avocado Anaphylaxis   Latex Shortness Of Breath and Rash   Codeine Nausea Only    Level of Care/Admitting Diagnosis ED Disposition     ED Disposition  Admit   Condition  --   Comment  Hospital Area: [facility] [[id number]]  Level of Care: Telemetry Medical [104]  May admit patient to [name] or [facility] if equivalent level of care is available:: Yes  Covid Evaluation: Symptomatic Person Under Investigation (PUI) or recent exposure (last 10 days) *Testing Required*  Diagnosis: COPD exacerbation [facility]) [[id number]]  Admitting Physician: Reymundo Poll [[id number]]  Attending Physician: Reymundo Poll [[id number]]  Certification:: I certify this patient will need inpatient services for at least 2 midnights  Expected Medical Readiness: 07/14/2023          B Medical/Surgery History Past Medical History:  Diagnosis Date   Allergy    seasonal   Anemia    Anxiety    takes Lexapro daily   Arthritis    "back, from neck down pass my bra area" (03/25/2017)   Asthma    Bartholin gland cyst 08/29/2011   Bruises easily    pt is on Effient   Chronic back pain    herniated nucleus pulposus   Chronic back pain    "neck to bra area; lower back" (03/25/2017)   Chronic kidney disease    recurrent UTI's this year  2022   COPD (chronic obstructive pulmonary disease) (HCC)    early stages   Coronary artery disease    Depression    takes Klonopin daily   Diabetes mellitus without complication (HCC)    Diverticulosis    Fibroadenoma of left breast    GERD (gastroesophageal reflux disease)    takes Nexium daily   H/O hiatal hernia    Heart attack (HCC) 2011   Hemorrhoids    Hernia    Hyperlipidemia    takes Lipitor daily   Hypertension    takes Losartan daily and Labetalol bid   Hypothyroidism    takes Synthroid daily   Insomnia    hydroxyzine prn   Joint pain    Pneumonia    "couple times" (03/25/2017)   Pre-diabetes    "just found out 1 wk ago" (03/25/2017)   Psoriasis    elbows,knees,back   Shortness of breath    with exertion   Slowing of urinary stream    Stress incontinence    Past Surgical History:  Procedure Laterality Date   ABDOMINAL EXPLORATION SURGERY  1977   ABDOMINAL HYSTERECTOMY  1977   "left one of my ovaries"   BACK SURGERY     BIOPSY  05/27/2019   Procedure: BIOPSY;  Surgeon: Corbin Ade, MD;  Location: [facility];  Service: Endoscopy;;   BREAST BIOPSY Left 11/2021  x2   COLONOSCOPY  2011   Dr. Christella Hartigan: Mild diverticulosis, descending diminutive colon polyp (not retrieved), next colonoscopy 10 years   CORONARY ANGIOPLASTY WITH STENT PLACEMENT  2011 X2   "regular stents didn't work; had to go back in in ~ 1 month and put in medicated stents"   DILATION AND CURETTAGE OF UTERUS     ESOPHAGOGASTRODUODENOSCOPY     ESOPHAGOGASTRODUODENOSCOPY (EGD) WITH PROPOFOL N/A 05/27/2019   normal esophagus, dilation, erosive gastropathy s/p biopsy, normal duodenum. Negative H.pylori.    LEFT HEART CATH AND CORONARY ANGIOGRAPHY N/A 03/26/2017   Procedure: LEFT HEART CATH AND CORONARY ANGIOGRAPHY;  Surgeon: Lyn Records, MD;  Location: [facility] INVASIVE CV LAB;  Service: Cardiovascular;  Laterality: N/A;   LUMBAR LAMINECTOMY/DECOMPRESSION MICRODISCECTOMY  08/05/2011    Procedure: LUMBAR LAMINECTOMY/DECOMPRESSION MICRODISCECTOMY;  Surgeon: Clydene Fake, MD;  Location: [facility] NEURO ORS;  Service: Neurosurgery;  Laterality: Right;  Right Lumbar four-five extraforaminal discectomy   MALONEY DILATION N/A 05/27/2019   Procedure: Elease Hashimoto DILATION;  Surgeon: Corbin Ade, MD;  Location: [facility];  Service: Endoscopy;  Laterality: N/A;  54   SHOULDER ARTHROSCOPY WITH ROTATOR CUFF REPAIR AND OPEN BICEPS TENODESIS Right 12/24/2021   Procedure: RIGHT SHOULDER ARTHROSCOPY WITH ROTATOR CUFF REPAIR AND BICEPS TENODESIS;  Surgeon: Eldred Manges, MD;  Location: [facility] OR;  Service: Orthopedics;  Laterality: Right;   TONSILLECTOMY     as a child   UPPER GASTROINTESTINAL ENDOSCOPY       A IV Location/Drains/Wounds Patient Lines/Drains/Airways Status     Active Line/Drains/Airways     Name Placement date Placement time Site Days   Peripheral IV 07/12/23 18 G 1" Right Antecubital 07/12/23  0800  Antecubital  less than 1            Intake/Output Last 24 hours  Intake/Output Summary (Last 24 hours) at 07/12/2023 1317 Last data filed at 07/12/2023 1610 Gross per 24 hour  Intake 50 ml  Output --  Net 50 ml    Labs/Imaging Results for orders placed or performed during the hospital encounter of 07/11/23 (from the past 48 hours)  CBC with Differential     Status: Abnormal   Collection Time: 07/11/23  4:41 PM  Result Value Ref Range   WBC 5.8 4.0 - 10.5 K/uL   RBC 4.14 3.87 - 5.11 MIL/uL   Hemoglobin 13.4 12.0 - 15.0 g/dL   HCT 96.0 45.4 - 09.8 %   MCV 101.0 (H) 80.0 - 100.0 fL   MCH 32.4 26.0 - 34.0 pg   MCHC 32.1 30.0 - 36.0 g/dL   RDW 11.9 14.7 - 82.9 %   Platelets 376 150 - 400 K/uL   nRBC 0.0 0.0 - 0.2 %   Neutrophils Relative % 53 %   Neutro Abs 3.1 1.7 - 7.7 K/uL   Lymphocytes Relative 25 %   Lymphs Abs 1.5 0.7 - 4.0 K/uL   Monocytes Relative 19 %   Monocytes Absolute 1.1 (H) 0.1 - 1.0 K/uL   Eosinophils Relative 2 %   Eosinophils Absolute 0.1  0.0 - 0.5 K/uL   Basophils Relative 1 %   Basophils Absolute 0.0 0.0 - 0.1 K/uL   Immature Granulocytes 0 %   Abs Immature Granulocytes 0.02 0.00 - 0.07 K/uL    Comment: Performed at [facility], [address] [place], [place] [address]  Basic metabolic panel     Status: Abnormal   Collection Time: 07/11/23  4:41 PM  Result Value  Ref Range   Sodium 140 135 - 145 mmol/L   Potassium 3.8 3.5 - 5.1 mmol/L   Chloride 102 98 - 111 mmol/L   CO2 28 22 - 32 mmol/L   Glucose, Bld 77 70 - 99 mg/dL    Comment: Glucose reference range applies only to samples taken after fasting for at least 8 hours.   BUN 11 8 - 23 mg/dL   Creatinine, Ser 1.61 (H) 0.44 - 1.00 mg/dL   Calcium 8.5 (L) 8.9 - 10.3 mg/dL   GFR, Estimated 50 (L) >60 mL/min    Comment: (NOTE) Calculated using the CKD-EPI Creatinine Equation (2021)    Anion gap 10 5 - 15    Comment: Performed at [facility], [address], [place], [place] [address]  Resp panel by RT-PCR (RSV, Flu A&B, Covid) Anterior Nasal Swab     Status: None   Collection Time: 07/12/23  7:15 AM   Specimen: Anterior Nasal Swab  Result Value Ref Range   SARS Coronavirus 2 by RT PCR NEGATIVE NEGATIVE   Influenza A by PCR NEGATIVE NEGATIVE   Influenza B by PCR NEGATIVE NEGATIVE    Comment: (NOTE) The Xpert Xpress SARS-CoV-2/FLU/RSV plus assay is intended as an aid in the diagnosis of influenza from Nasopharyngeal swab specimens and should not be used as a sole basis for treatment. Nasal washings and aspirates are unacceptable for Xpert Xpress SARS-CoV-2/FLU/RSV testing.  Fact Sheet for Patients: BloggerCourse.com  Fact Sheet for Healthcare Providers: SeriousBroker.it  This test is not yet approved or cleared by the [place] FDA and has been authorized for detection and/or diagnosis of SARS-CoV-2 by FDA under an Emergency Use Authorization (EUA). This EUA will remain in effect (meaning  this test can be used) for the duration of the COVID-19 declaration under Section 564(b)(1) of the Act, 21 U.S.C. section 360bbb-3(b)(1), unless the authorization is terminated or revoked.     Resp Syncytial Virus by PCR NEGATIVE NEGATIVE    Comment: (NOTE) Fact Sheet for Patients: BloggerCourse.com  Fact Sheet for Healthcare Providers: SeriousBroker.it  This test is not yet approved or cleared by the [place] FDA and has been authorized for detection and/or diagnosis of SARS-CoV-2 by FDA under an Emergency Use Authorization (EUA). This EUA will remain in effect (meaning this test can be used) for the duration of the COVID-19 declaration under Section 564(b)(1) of the Act, 21 U.S.C. section 360bbb-3(b)(1), unless the authorization is terminated or revoked.  Performed at [facility], [address] [place], [place] [address]   Troponin I (High Sensitivity)     Status: None   Collection Time: 07/12/23  8:00 AM  Result Value Ref Range   Troponin I (High Sensitivity) 4 <18 ng/L    Comment: (NOTE) Elevated high sensitivity troponin I (hsTnI) values and significant  changes across serial measurements may suggest ACS but many other  chronic and acute conditions are known to elevate hsTnI results.  Refer to the "Links" section for chest pain algorithms and additional  guidance. Performed at [facility], [address] [place], [place] [address]   Brain natriuretic peptide     Status: None   Collection Time: 07/12/23  8:00 AM  Result Value Ref Range   B Natriuretic Peptide 20.0 0.0 - 100.0 pg/mL    Comment: Performed at [facility], [address] [place], [place] [address]   *Note: Due to a large number of results and/or encounters for the requested  time period, some results have not been displayed. A complete set of results can be found in Results Review.   DG Chest 2 View Result Date:  07/11/2023 CLINICAL DATA:  Shortness of breath. EXAM: CHEST - 2 VIEW COMPARISON:  06/13/2023. FINDINGS: Bilateral lung fields are clear. Bilateral costophrenic angles are clear. Normal cardio-mediastinal silhouette. No acute osseous abnormalities. Old healed and malunited left middle third clavicular fracture noted. The soft tissues are within normal limits. IMPRESSION: No active cardiopulmonary disease. Electronically Signed   By: Jules Schick M.D.   On: 07/11/2023 17:29    Pending Labs Unresulted Labs (From admission, onward)     Start     Ordered   07/13/23 0500  Comprehensive metabolic panel  Tomorrow morning,   R        07/12/23 0943   07/13/23 0500  CBC with Differential/Platelet  Tomorrow morning,   R        07/12/23 0943            Vitals/Pain Today's Vitals   07/12/23 1045 07/12/23 1100 07/12/23 1115 07/12/23 1119  BP: (!) 103/58 (!) 108/55 103/68   Pulse: 75 75 78   Resp: (!) 21 18 17    Temp:      TempSrc:      SpO2: (!) 85% (!) 87% 98%   Weight:      Height:      PainSc:    0-No pain    Isolation Precautions No active isolations  Medications Medications  enoxaparin (LOVENOX) injection 40 mg (40 mg Subcutaneous Given 07/12/23 1006)  ipratropium-albuterol (DUONEB) 0.5-2.5 (3) MG/3ML nebulizer solution 3 mL (3 mLs Nebulization Given 07/12/23 1003)  predniSONE (DELTASONE) tablet 40 mg (has no administration in time range)  atorvastatin (LIPITOR) tablet 80 mg (80 mg Oral Given 07/12/23 1106)  busPIRone (BUSPAR) tablet 30 mg (has no administration in time range)    And  busPIRone (BUSPAR) tablet 15 mg (15 mg Oral Given 07/12/23 1106)    And  busPIRone (BUSPAR) tablet 15 mg (has no administration in time range)  ticagrelor (BRILINTA) tablet 60 mg (60 mg Oral Given 07/12/23 1118)  nicotine (NICODERM CQ - dosed in mg/24 hours) patch 14 mg (14 mg Transdermal Patch Applied 07/12/23 1107)  doxepin (SINEQUAN) capsule 75 mg (has no administration in time range)   escitalopram (LEXAPRO) tablet 20 mg (20 mg Oral Given 07/12/23 1107)  furosemide (LASIX) tablet 20 mg (20 mg Oral Given 07/12/23 1107)  clonazepam (KLONOPIN) disintegrating tablet 0.25 mg (0.25 mg Oral Given 07/12/23 1107)  levothyroxine (SYNTHROID) tablet 112 mcg (has no administration in time range)  montelukast (SINGULAIR) tablet 10 mg (has no administration in time range)  nebivolol (BYSTOLIC) tablet 10 mg (10 mg Oral Given 07/12/23 1119)  aspirin EC tablet 81 mg (81 mg Oral Given 07/12/23 1106)  azithromycin (ZITHROMAX) tablet 500 mg (500 mg Oral Given 07/12/23 1107)  budesonide (PULMICORT) nebulizer solution 0.25 mg (0.25 mg Nebulization Given 07/12/23 1106)  ipratropium-albuterol (DUONEB) 0.5-2.5 (3) MG/3ML nebulizer solution 3 mL (3 mLs Nebulization Given 07/11/23 1636)  ipratropium-albuterol (DUONEB) 0.5-2.5 (3) MG/3ML nebulizer solution 3 mL (3 mLs Nebulization Given 07/12/23 0815)  methylPREDNISolone sodium succinate (SOLU-MEDROL) 125 mg/2 mL injection 125 mg (125 mg Intravenous Given 07/12/23 0805)  magnesium sulfate IVPB 2 g 50 mL (0 g Intravenous Stopped 07/12/23 0833)    Mobility walks with person assist     Focused Assessments Pulmonary Assessment Handoff:  Lung sounds: Bilateral Breath Sounds: Rhonchi, Coarse crackles, Expiratory wheezes, Inspiratory  wheezes O2 Device: Nasal Cannula (returned to [place]) O2 Flow Rate (L/min): 2 L/min    R Recommendations: See Admitting Provider Note  Report given to:   Additional Notes:

## 2023-07-12 NOTE — Plan of Care (Signed)
  Problem: Education: Goal: Knowledge of General Education information will improve Description: Including pain rating scale, medication(s)/side effects and non-pharmacologic comfort measures Outcome: Progressing   Problem: Health Behavior/Discharge Planning: Goal: Ability to manage health-related needs will improve Outcome: Progressing   Problem: Clinical Measurements: Goal: Respiratory complications will improve Outcome: Progressing   Problem: Activity: Goal: Risk for activity intolerance will decrease Outcome: Progressing   Problem: Coping: Goal: Level of anxiety will decrease Outcome: Progressing   Problem: Safety: Goal: Ability to remain free from injury will improve Outcome: Progressing   Problem: Pain Management: Goal: General experience of comfort will improve Outcome: Progressing

## 2023-07-12 NOTE — ED Provider Notes (Addendum)
[place] EMERGENCY DEPARTMENT AT [facility] Provider Note   CSN: [id number] Arrival date & time: 07/11/23  1548     History  Chief Complaint  Patient presents with   Shortness of Breath   Cough    [name] [name] is a 67 y.o. female.  67 yo F with a chief complaints of difficulty breathing.  She tells me that she was recently hospitalized and was found to have the flu.  Since then she has not gotten better.  She think she is gotten worse over the past week.  Her home health nurse was worried about her breathing and checked a COVID test and it was positive and then she was sent here for evaluation.  She also is having a sensation that her chest is very tight like something is sitting on it.  Worse when she lays back flat.  She is unable to tell if this feels like her normal COPD exacerbations.  She is on 3 L of oxygen at all times but says that that was just started her last hospitalization.   Shortness of Breath Associated symptoms: cough   Cough Associated symptoms: shortness of breath        Home Medications Prior to Admission medications   Medication Sig Start Date End Date Taking? Authorizing Provider  albuterol (VENTOLIN HFA) 108 (90 Base) MCG/ACT inhaler Inhale 2 puffs into the lungs every 6 (six) hours as needed for wheezing or shortness of breath. 01/17/23   [name], MD  alendronate (FOSAMAX) 35 MG tablet Take 1 tablet (35 mg total) by mouth every 7 (seven) days. Take with a full glass of water on an empty stomach. Sit upright for 30-60 mins after taking Patient taking differently: Take 35 mg by mouth every Wednesday. 02/27/23   [name], MD  amLODipine (NORVASC) 5 MG tablet Take 1 tablet (5 mg total) by mouth daily. 09/18/22   Joylene Grapes, NP  aspirin EC 81 MG tablet Take 81 mg by mouth daily.    [provider]  atorvastatin (LIPITOR) 80 MG tablet Take 1 tablet (80 mg total) by mouth daily, please schedule appointment for more  refills 09/18/22   Joylene Grapes, NP  Blood Glucose Monitoring Suppl (TRUE METRIX METER) DEVI 1 kit by Does not apply route 4 (four) times daily. 04/04/17   Vivianne Master, PA-C  busPIRone (BUSPAR) 15 MG tablet Take 2 tablets (30 mg total) by mouth in the morning AND 1 tablet (15 mg total) daily at 12 noon AND 1 tablet (15 mg total) every evening. 06/27/23   Nwoko, Tommas Olp, PA  clonazePAM (KLONOPIN) 0.5 MG tablet Take 0.5 tablets (0.25 mg total) by mouth 2 (two) times daily as needed for anxiety. 07/12/23   Nwoko, Tommas Olp, PA  doxepin (SINEQUAN) 75 MG capsule Take 1 capsule (75 mg total) by mouth at bedtime. 06/27/23   Nwoko, Tommas Olp, PA  escitalopram (LEXAPRO) 20 MG tablet Take 1 tablet (20 mg total) by mouth daily. 06/27/23   Nwoko, Tommas Olp, PA  ezetimibe (ZETIA) 10 MG tablet Take 1 tablet (10 mg total) by mouth daily. 07/04/23   Azalee Course, PA  Fluticasone-Umeclidin-Vilant (TRELEGY ELLIPTA) 100-62.5-25 MCG/ACT AEPB Inhale 1 puff into the lungs daily. 06/25/23   [name], MD  furosemide (LASIX) 20 MG tablet Take 1 tablet (20 mg total) by mouth daily. 09/18/22   Joylene Grapes, NP  gabapentin (NEURONTIN) 600 MG tablet TAKE 1 TABLET BY MOUTH  4 TIMES DAILY 12/20/22   [name], MD  glucose blood (TRUE METRIX BLOOD GLUCOSE TEST) test strip Use as instructed 01/21/18   [name], MD  ipratropium-albuterol (DUONEB) 0.5-2.5 (3) MG/3ML SOLN Take 3 mLs by nebulization 3 (three) times daily for 14 days. 06/20/23 07/10/23  Tawkaliyar, Roya, DO  ketorolac (ACULAR) 0.5 % ophthalmic solution Place 1 drop into the left eye 4 (four) times daily. Patient not taking: Reported on 07/04/2023    [provider]  levothyroxine (SYNTHROID) 112 MCG tablet Take 1 tablet (112 mcg total) by mouth daily. 12/20/22   [name], MD  losartan (COZAAR) 50 MG tablet Take 1.5 tablets (75 mg total) by mouth daily. 09/18/22   Joylene Grapes, NP  metFORMIN (GLUCOPHAGE) 500 MG tablet Take 1  tablet (500 mg total) by mouth 2 (two) times daily with a meal. Patient taking differently: Take 500 mg by mouth daily with lunch. 04/23/23   [name], MD  mirtazapine (REMERON) 45 MG tablet Take 1 tablet (45 mg total) by mouth at bedtime. 06/27/23   Nwoko, Tommas Olp, PA  montelukast (SINGULAIR) 10 MG tablet Take 1 tablet (10 mg total) by mouth at bedtime. 12/20/22   [name], MD  nebivolol (BYSTOLIC) 10 MG tablet Take 1 tablet (10 mg total) by mouth daily. 09/18/22   Joylene Grapes, NP  nicotine (NICODERM CQ - DOSED IN MG/24 HOURS) 14 mg/24hr patch Place 1 patch (14 mg total) onto the skin daily. 06/21/23   Tawkaliyar, Roya, DO  nitroGLYCERIN (NITROSTAT) 0.4 MG SL tablet Place 1 tablet (0.4 mg total) under the tongue every 5 (five) minutes as needed for chest pain. 09/18/22 07/26/23  Joylene Grapes, NP  ofloxacin (OCUFLOX) 0.3 % ophthalmic solution Place 1 drop into the left eye 4 (four) times daily. Patient not taking: Reported on 07/04/2023    [provider]  omeprazole (PRILOSEC) 40 MG capsule Take 1 capsule (40 mg total) by mouth 2 (two) times daily before a meal. 02/19/23   Lemmon, Violet Baldy, PA  prednisoLONE acetate (PRED FORTE) 1 % ophthalmic suspension Place 1 drop into the left eye 4 (four) times daily. Patient not taking: Reported on 07/04/2023    [provider]  Spacer/Aero-Holding Chambers (AEROCHAMBER MV) inhaler Use as instructed 04/26/22   Domenick Gong, MD  ticagrelor (BRILINTA) 60 MG TABS tablet Take 1 tablet (60 mg total) by mouth 2 (two) times daily. 09/18/22   Joylene Grapes, NP  triamcinolone (KENALOG) 0.025 % ointment Apply 1 Application topically 2 (two) times daily as needed. 01/17/23   [name], MD  TRUEPLUS LANCETS 28G MISC 28 g by Does not apply route QID. 04/04/17   Vivianne Master, PA-C      Allergies    Avocado, Latex, and Codeine    Review of Systems   Review of Systems  Respiratory:  Positive for cough and shortness of  breath.     Physical Exam Updated Vital Signs BP (!) 114/50 (BP Location: Left Arm)   Pulse 78   Temp 97.9 F (36.6 C) (Oral)   Resp 16   Ht 4\' 11"  (1.499 m)   Wt 67 kg   SpO2 98%   BMI 29.83 kg/m  Physical Exam Vitals and nursing note reviewed.  Constitutional:      General: She is not in acute distress.    Appearance: She is well-developed. She is not diaphoretic.  HENT:     Head: Normocephalic  and atraumatic.  Eyes:     Pupils: Pupils are equal, round, and reactive to light.  Cardiovascular:     Rate and Rhythm: Normal rate and regular rhythm.     Heart sounds: No murmur heard.    No friction rub. No gallop.  Pulmonary:     Effort: Pulmonary effort is normal.     Breath sounds: Wheezing present. No rales.     Comments: Coarse breath sounds in all fields with prolonged expiratory effort and wheezes Abdominal:     General: There is no distension.     Palpations: Abdomen is soft.     Tenderness: There is no abdominal tenderness.  Musculoskeletal:        General: No tenderness.     Cervical back: Normal range of motion and neck supple.  Skin:    General: Skin is warm and dry.  Neurological:     Mental Status: She is alert and oriented to person, place, and time.  Psychiatric:        Behavior: Behavior normal.     ED Results / Procedures / Treatments   Labs (all labs ordered are listed, but only abnormal results are displayed) Labs Reviewed  CBC WITH DIFFERENTIAL/PLATELET - Abnormal; Notable for the following components:      Result Value   MCV 101.0 (*)    Monocytes Absolute 1.1 (*)    All other components within normal limits  BASIC METABOLIC PANEL - Abnormal; Notable for the following components:   Creatinine, Ser 1.19 (*)    Calcium 8.5 (*)    GFR, Estimated 50 (*)    All other components within normal limits  RESP PANEL BY RT-PCR (RSV, FLU A&B, COVID)  RVPGX2  BRAIN NATRIURETIC PEPTIDE  TROPONIN I (HIGH SENSITIVITY)    EKG EKG  Interpretation Date/Time:  Friday July 11 2023 16:10:06 EST Ventricular Rate:  63 PR Interval:  146 QRS Duration:  76 QT Interval:  446 QTC Calculation: 456 R Axis:   75  Text Interpretation: Normal sinus rhythm Normal ECG Baseline wander TECHNICALLY DIFFICULT Otherwise no significant change Confirmed by Melene Plan [phone]) on 07/12/2023 7:36:52 AM  Radiology DG Chest 2 View Result Date: 07/11/2023 CLINICAL DATA:  Shortness of breath. EXAM: CHEST - 2 VIEW COMPARISON:  06/13/2023. FINDINGS: Bilateral lung fields are clear. Bilateral costophrenic angles are clear. Normal cardio-mediastinal silhouette. No acute osseous abnormalities. Old healed and malunited left middle third clavicular fracture noted. The soft tissues are within normal limits. IMPRESSION: No active cardiopulmonary disease. Electronically Signed   By: Jules Schick M.D.   On: 07/11/2023 17:29    Procedures .Critical Care  Performed by: Melene Plan, DO Authorized by: Melene Plan, DO   Critical care provider statement:    Critical care time (minutes):  35   Critical care time was exclusive of:  Separately billable procedures and treating other patients   Critical care was time spent personally by me on the following activities:  Development of treatment plan with patient or surrogate, discussions with consultants, evaluation of patient's response to treatment, examination of patient, ordering and review of laboratory studies, ordering and review of radiographic studies, ordering and performing treatments and interventions, pulse oximetry, re-evaluation of patient's condition and review of old charts   Care discussed with: admitting provider       Medications Ordered in ED Medications  ipratropium-albuterol (DUONEB) 0.5-2.5 (3) MG/3ML nebulizer solution 3 mL (3 mLs Nebulization Given 07/11/23 1636)  ipratropium-albuterol (DUONEB) 0.5-2.5 (3) MG/3ML nebulizer solution 3  mL (3 mLs Nebulization Given 07/12/23 0815)   methylPREDNISolone sodium succinate (SOLU-MEDROL) 125 mg/2 mL injection 125 mg (125 mg Intravenous Given 07/12/23 0805)  magnesium sulfate IVPB 2 g 50 mL (0 g Intravenous Stopped 07/12/23 1914)    ED Course/ Medical Decision Making/ A&P                                 Medical Decision Making Amount and/or Complexity of Data Reviewed Labs: ordered.  Risk Prescription drug management.   66 yoF with a chief complaint of difficulty breathing.  The patient tells me that she was recently hospitalized and has not improved since she has been home.  She is actually gotten worse over the past week and her home health nurse was worried about her and checked a home COVID test which was positive.  She was then sent here for evaluation.  She is on 3 L of oxygen at all times.  She tells me this was only started at her last hospitalization.  On my record review this appears to be consistent with the medical record.  She was actually given oxygen for only as needed.  It sounds like she is titrated up to 3 L at all times despite being on 2 L as needed.  Chest x-ray independently interpreted by me without focal infiltrate or pneumothorax.  No significant electrolyte abnormalities no anemia.  With her wheezing we will give 3 DuoNebs back-to-back and Solu-Medrol magnesium and reassess.  Patient with mild improvement after 3 DuoNebs.  She still feels too unwell to go back home.  Will discuss with medicine.   The patients results and plan were reviewed and discussed.   Any x-rays performed were independently reviewed by myself.   Differential diagnosis were considered with the presenting HPI.  Medications  ipratropium-albuterol (DUONEB) 0.5-2.5 (3) MG/3ML nebulizer solution 3 mL (3 mLs Nebulization Given 07/11/23 1636)  ipratropium-albuterol (DUONEB) 0.5-2.5 (3) MG/3ML nebulizer solution 3 mL (3 mLs Nebulization Given 07/12/23 0815)  methylPREDNISolone sodium succinate (SOLU-MEDROL) 125 mg/2 mL  injection 125 mg (125 mg Intravenous Given 07/12/23 0805)  magnesium sulfate IVPB 2 g 50 mL (0 g Intravenous Stopped 07/12/23 0833)    Vitals:   07/12/23 0830 07/12/23 0835 07/12/23 0840 07/12/23 0845  BP: (!) 114/50     Pulse: 68 69 71 78  Resp: (!) 22  20 16   Temp: 97.9 F (36.6 C)     TempSrc: Oral     SpO2: 100% 100% 100% 98%  Weight:      Height:        Final diagnoses:  COPD exacerbation (HCC)  COVID-19 virus infection    Admission/ observation were discussed with the admitting physician, patient and/or family and they are comfortable with the plan.          Final Clinical Impression(s) / ED Diagnoses Final diagnoses:  COPD exacerbation (HCC)  COVID-19 virus infection    Rx / DC Orders ED Discharge Orders     None         Melene Plan, DO 07/12/23 0902    Melene Plan, DO 07/12/23 [phone]

## 2023-07-12 NOTE — ED Notes (Signed)
Neb complete, sleeping. Returned to [facility] 2LPM.

## 2023-07-13 DIAGNOSIS — J441 Chronic obstructive pulmonary disease with (acute) exacerbation: Secondary | ICD-10-CM | POA: Diagnosis not present

## 2023-07-13 LAB — COMPREHENSIVE METABOLIC PANEL
ALT: 16 U/L (ref 0–44)
AST: 14 U/L — ABNORMAL LOW (ref 15–41)
Albumin: 3.1 g/dL — ABNORMAL LOW (ref 3.5–5.0)
Alkaline Phosphatase: 76 U/L (ref 38–126)
Anion gap: 8 (ref 5–15)
BUN: 20 mg/dL (ref 8–23)
CO2: 27 mmol/L (ref 22–32)
Calcium: 9.3 mg/dL (ref 8.9–10.3)
Chloride: 102 mmol/L (ref 98–111)
Creatinine, Ser: 0.73 mg/dL (ref 0.44–1.00)
GFR, Estimated: >60 mL/min (ref >60)
Glucose, Bld: 141 mg/dL — ABNORMAL HIGH (ref 70–99)
Potassium: 4.6 mmol/L (ref 3.5–5.1)
Sodium: 137 mmol/L (ref 135–145)
Total Bilirubin: 0.3 mg/dL (ref <1.2)
Total Protein: 5.8 g/dL — ABNORMAL LOW (ref 6.5–8.1)

## 2023-07-13 LAB — CBC WITH DIFFERENTIAL/PLATELET
Abs Immature Granulocytes: 0.02 10*3/uL (ref 0.00–0.07)
Basophils Absolute: 0 10*3/uL (ref 0.0–0.1)
Basophils Relative: 0 %
Eosinophils Absolute: 0 10*3/uL (ref 0.0–0.5)
Eosinophils Relative: 0 %
HCT: 38.1 % (ref 36.0–46.0)
Hemoglobin: 12.4 g/dL (ref 12.0–15.0)
Immature Granulocytes: 0 %
Lymphocytes Relative: 9 %
Lymphs Abs: 0.8 10*3/uL (ref 0.7–4.0)
MCH: 31.6 pg (ref 26.0–34.0)
MCHC: 32.5 g/dL (ref 30.0–36.0)
MCV: 97.2 fL (ref 80.0–100.0)
Monocytes Absolute: 0.9 10*3/uL (ref 0.1–1.0)
Monocytes Relative: 10 %
Neutro Abs: 7 10*3/uL (ref 1.7–7.7)
Neutrophils Relative %: 81 %
Platelets: 396 10*3/uL (ref 150–400)
RBC: 3.92 MIL/uL (ref 3.87–5.11)
RDW: 14.3 % (ref 11.5–15.5)
WBC: 8.7 10*3/uL (ref 4.0–10.5)
nRBC: 0 % (ref 0.0–0.2)

## 2023-07-13 MED ORDER — PREDNISONE 20 MG PO TABS
40.0000 mg | ORAL_TABLET | Freq: Every day | ORAL | Status: DC
  Administered 2023-07-13 – 2023-07-15 (×3): 40 mg via ORAL
  Filled 2023-07-13 (×3): qty 2

## 2023-07-13 MED ORDER — AMOXICILLIN-POT CLAVULANATE 875-125 MG PO TABS
1.0000 | ORAL_TABLET | Freq: Two times a day (BID) | ORAL | Status: DC
  Administered 2023-07-13 – 2023-07-15 (×5): 1 via ORAL
  Filled 2023-07-13 (×5): qty 1

## 2023-07-13 NOTE — Discharge Instructions (Signed)
You were hospitalized for an acute COPD exacerbation. You were treated with steroids and antibiotics and your condition is improving enough to continue these medications outpatient. Thank you for allowing [place] to be part of your care.   Please call and schedule a hospital follow up appointment in 1-2 weeks with your PCP, [name], MD.  Please note these changes made to your medications:   Please START taking:  Azithromycin 500 mg (2 tablets) tomorrow. This completes the antibiotic course. Prednisone 40 mg (2 tablets) daily with breakfast for the next three days.  Please make sure to use your inhalers as prescribed. You may continue taking your other medications as they were prescribed.   Please call our clinic if you have any questions or concerns, we may be able to help and keep you from a long and expensive emergency room wait. Our clinic and after hours phone number is [phone], the best time to call is Monday through Friday 9 am to 4 pm but there is always someone available 24/7 if you have an emergency. If you need medication refills please notify your pharmacy one week in advance and they will send [place] a request.

## 2023-07-13 NOTE — Plan of Care (Signed)

## 2023-07-13 NOTE — Progress Notes (Addendum)
HD#1 SUBJECTIVE:  Patient Summary: [name] [name] is a 67 y.o. female with a pertinent PMH of COPD, hypertension, prediabetes, CAD s/p stents, and anxiety who presented with dyspnea and generalized weakness and is admitted for COPD exacerbation.  Overnight Events: None  Interim History: Patient evaluated at bedside. She states she still feels poorly. Eating breakfast without concern. No new symptoms or shortness of breath, but continues to have head congestion and a wet cough.   OBJECTIVE:  Vital Signs: Vitals:   07/12/23 2338 07/13/23 0317 07/13/23 0751 07/13/23 0847  BP: 118/78 124/67 134/82   Pulse: 73 82 86   Resp: 20 20 18    Temp: 98.2 F (36.8 C) 98.2 F (36.8 C) 98.2 F (36.8 C)   TempSrc: Oral Oral Oral   SpO2: 96% 96% 100% 99%  Weight:      Height:       Supplemental O2: Nasal Cannula SpO2: 99 % O2 Flow Rate (L/min): 2 L/min FiO2 (%): 100 %  Physical Exam:  General: well-appearing, in no acute distress, sitting up in bed Cardiac: RRR, no murmurs Pulmonary: significant wheezing diffusely in bilateral lungs, worse on expiration; persistent wet cough Neuro: No focal deficits, alert and oriented x 3 Psych: Normal mood and affect  Patient Lines/Drains/Airways Status     Active Line/Drains/Airways     Name Placement date Placement time Site Days   Peripheral IV 07/12/23 18 G 1" Right Antecubital 07/12/23  0800  Antecubital  1            Pertinent Labs:    Latest Ref Rng & Units 07/13/2023    5:13 AM 07/11/2023    4:41 PM 06/19/2023    2:54 AM  CBC  WBC 4.0 - 10.5 K/uL 8.7  5.8  10.2   Hemoglobin 12.0 - 15.0 g/dL 16.1  09.6  04.5   Hematocrit 36.0 - 46.0 % 38.1  41.8  43.7   Platelets 150 - 400 K/uL 396  376  482        Latest Ref Rng & Units 07/13/2023    5:13 AM 07/11/2023    4:41 PM 06/26/2023    1:39 PM  CMP  Glucose 70 - 99 mg/dL 409  77  65   BUN 8 - 23 mg/dL 20  11  14    Creatinine 0.44 - 1.00 mg/dL 8.11  9.14  7.82   Sodium 135 - 145  mmol/L 137  140  142   Potassium 3.5 - 5.1 mmol/L 4.6  3.8  3.7   Chloride 98 - 111 mmol/L 102  102  99   CO2 22 - 32 mmol/L 27  28  29    Calcium 8.9 - 10.3 mg/dL 9.3  8.5  9.0   Total Protein 6.5 - 8.1 g/dL 5.8   6.2   Total Bilirubin <1.2 mg/dL 0.3   0.4   Alkaline Phos 38 - 126 U/L 76   85   AST 15 - 41 U/L 14   15   ALT 0 - 44 U/L 16   37     ASSESSMENT/PLAN:  Assessment: Principal Problem:   Acute exacerbation of chronic obstructive pulmonary disease (COPD) (HCC) Active Problems:   QT prolongation   Acute hypoxic respiratory failure (HCC)  [name] [name] is a 67 y.o. person living with a history of COPD, hypertension, prediabetes, CAD s/p stents, and anxiety who presented with dyspnea and generalized weakness and is admitted for COPD exacerbation on hospital day 2.  Plan:  Acute COPD exacerbation Suspect bacterial sinusitis  She is satting well on room air at rest with no new shortness of breath. Continues to have congestion, viral panel was negative. Has a persistent wet cough, which seems bronchitic, so we will add antibiotics for broader bacterial coverage. She ambulated well with O2 saturation of 87% on room air and 94% on her home oxygen of 2L [place]. She did feel a little unsteady and dizzy when standing, so we will check orthostatics and put in a PT consult. She will likely be ready for discharge tomorrow if she feels better, pending PT eval.  - augmentin 975-125 mg BID for 7 days - checking orthostatics - PT eval - prednisone 40 mg, day 2/5 - azithromycin 500 mg, day 2/3 - pulmicort 0.25 mg BID - duonebs q6h - montelukast 10 mg daily  Chronic conditions: HFpEF: Continued home lasix 20 mg, nebivolol 10 mg daily.  CAD s/p stents: Continued home ticagrelor 60 mg daily, ASA 81 mg daily.  HTN: Continued home amlodipine 5 mg daily, losartan 75 mg daily. Prediabetes: holding metformin 500 mg BID for now.  Anxiety/Depression: Continued home buspar TID, klonopin 0.25 mg BID,  doxepin 75 mg at bedtime. Continued home lexapro 20 mg daily. Holding mirtazapine.  Hypothyroidism: TSH one month ago wnl. Continued home synthroid 112 mcg daily.   Best Practice: Diet: Cardiac diet IVF: Fluids: none VTE: enoxaparin (LOVENOX) injection 40 mg Start: 07/12/23 0945 Code: Full AB: augmentin, azithromycin Therapy Recs: Pending DISPO: Anticipated discharge tomorrow to Home pending medical improvement and PT eval.  Signature: Annett Fabian, MD  Internal Medicine Resident, PGY-1 [name]  Pager: [phone] 11:00 AM, 07/13/2023   Please contact the on call pager after 5 pm and on weekends at [phone].

## 2023-07-13 NOTE — Hospital Course (Signed)
Acute COPD exacerbation, resolved Suspect bacterial sinusitis, improving She presented to the emergency department for readmission for COPD exacerbation, recently discharged for the same in the setting of parainfluenza infection.  She was sent home previously with 2 L nasal cannula, but felt persistently fatigued and short of breath at home, so she returned to the hospital for evaluation.  On presentation, she had diffuse inspiratory and expiratory wheezing with poor airflow.  Chest x-ray, ECG, BNP, troponins, and respiratory panel were all negative.  She received 3 DuoNebs in the emergency department in addition to Solu-Medrol and magnesium.  Her respiratory status significantly improved with these interventions.  She was continued on DuoNebs, azithromycin, and prednisone for COPD exacerbation.  Augmentin was added on the following day as she continued to experience sinus pressure and continued congestion and bronchitic symptoms.  She improved significantly over the course of the next few days on this regimen and was restarted on her home inhalers. She was evaluated by physical therapy, who recommended continuation of her home health physical therapy. She was able to ambulate with 2L [place] without desaturations. Orthostatic vitals were negative for hypotension. With her wheezing and shortness of breath improved, she is medically stable for discharge with home health. We will send her with a steroid taper and a 7 day course of antibiotics, as we feel she is at risk of readmission. She will need close follow up with her primary care physician.

## 2023-07-14 DIAGNOSIS — J441 Chronic obstructive pulmonary disease with (acute) exacerbation: Secondary | ICD-10-CM | POA: Diagnosis not present

## 2023-07-14 MED ORDER — FLUTICASONE FUROATE-VILANTEROL 100-25 MCG/ACT IN AEPB
1.0000 | INHALATION_SPRAY | Freq: Every day | RESPIRATORY_TRACT | Status: DC
  Administered 2023-07-15: 1 via RESPIRATORY_TRACT
  Filled 2023-07-14: qty 28

## 2023-07-14 MED ORDER — UMECLIDINIUM BROMIDE 62.5 MCG/ACT IN AEPB
1.0000 | INHALATION_SPRAY | Freq: Every day | RESPIRATORY_TRACT | Status: DC
  Administered 2023-07-15: 1 via RESPIRATORY_TRACT
  Filled 2023-07-14: qty 7

## 2023-07-14 NOTE — Progress Notes (Signed)
HD#2 SUBJECTIVE:  Patient Summary: [name] [name] is a 67 y.o. female with a pertinent PMH of COPD, hypertension, prediabetes, CAD s/p stents, and anxiety who presented with dyspnea and generalized weakness and is admitted for COPD exacerbation.  Overnight Events: None  Interim History: Patient evaluated at bedside. She states she feels bad and has shortness of breath. On 2L [place]. No other new symptoms noted. Satting well. Does not feel she is ready for discharge.   OBJECTIVE:  Vital Signs: Vitals:   07/13/23 1551 07/13/23 1944 07/14/23 0421 07/14/23 0745  BP: (!) 113/57 112/64 (!) 149/67 137/73  Pulse: 73 74 77 81  Resp:  20 18 16   Temp: 98.4 F (36.9 C) 98.5 F (36.9 C) 97.8 F (36.6 C) 98.7 F (37.1 C)  TempSrc: Oral Oral Oral Oral  SpO2: 96% 96% 96% 96%  Weight:      Height:       Supplemental O2: Nasal Cannula SpO2: 96 % O2 Flow Rate (L/min): 2 L/min FiO2 (%): 100 %  Physical Exam: General: well-appearing, in no acute distress, sitting up in bed; on 2L [place] Cardiac: RRR, no murmurs Pulmonary: moderate wheezing bilaterally, normal rate and effort Neuro: No focal deficits, alert and oriented x 3 Psych: Normal mood, anxious affect  Patient Lines/Drains/Airways Status     Active Line/Drains/Airways     Name Placement date Placement time Site Days   Peripheral IV 07/12/23 18 G 1" Right Antecubital 07/12/23  0800  Antecubital  1            Pertinent Labs:    Latest Ref Rng & Units 07/13/2023    5:13 AM 07/11/2023    4:41 PM 06/19/2023    2:54 AM  CBC  WBC 4.0 - 10.5 K/uL 8.7  5.8  10.2   Hemoglobin 12.0 - 15.0 g/dL 56.2  13.0  86.5   Hematocrit 36.0 - 46.0 % 38.1  41.8  43.7   Platelets 150 - 400 K/uL 396  376  482        Latest Ref Rng & Units 07/13/2023    5:13 AM 07/11/2023    4:41 PM 06/26/2023    1:39 PM  CMP  Glucose 70 - 99 mg/dL 784  77  65   BUN 8 - 23 mg/dL 20  11  14    Creatinine 0.44 - 1.00 mg/dL 6.96  2.95  2.84   Sodium 135 - 145  mmol/L 137  140  142   Potassium 3.5 - 5.1 mmol/L 4.6  3.8  3.7   Chloride 98 - 111 mmol/L 102  102  99   CO2 22 - 32 mmol/L 27  28  29    Calcium 8.9 - 10.3 mg/dL 9.3  8.5  9.0   Total Protein 6.5 - 8.1 g/dL 5.8   6.2   Total Bilirubin <1.2 mg/dL 0.3   0.4   Alkaline Phos 38 - 126 U/L 76   85   AST 15 - 41 U/L 14   15   ALT 0 - 44 U/L 16   37     ASSESSMENT/PLAN:  Assessment: Principal Problem:   Acute exacerbation of chronic obstructive pulmonary disease (COPD) (HCC) Active Problems:   QT prolongation   Acute hypoxic respiratory failure (HCC)  [name] [name] is a 67 y.o. person living with a history of COPD, hypertension, prediabetes, CAD s/p stents, and anxiety who presented with dyspnea and generalized weakness and is admitted for COPD exacerbation on hospital day  3.  Plan: Acute COPD exacerbation Suspect bacterial sinusitis  Patient satting well on 2L [place], her home rate. Concerned about shortness of breath. She continues to have wheezing, but it is improved from yesterday. She was able to ambulate on 2L [place] with no desaturations. Orthostatics were negative. We will restart her home inhaler regimen and keep on the duonebs today with probable discharge tomorrow.   - restarted trelegy inhaler; will continue with duoneb q6h - augmentin 975-125 mg BID, day 2/7 - PT eval - prednisone 40 mg, day 3/5 - azithromycin 500 mg, day 3/3 - montelukast 10 mg daily - discontinued pulmicort  Chronic conditions: HFpEF: Continued home lasix 20 mg, nebivolol 10 mg daily.  CAD s/p stents: Continued home ticagrelor 60 mg daily, ASA 81 mg daily.  HTN: Continued home amlodipine 5 mg daily, losartan 75 mg daily. Prediabetes: holding metformin 500 mg BID for now.  Anxiety/Depression: Continued home buspar TID, klonopin 0.25 mg BID, doxepin 75 mg at bedtime. Continued home lexapro 20 mg daily. Holding mirtazapine.  Hypothyroidism: TSH one month ago wnl. Continued home synthroid 112 mcg daily.   Best  Practice: Diet: Cardiac diet IVF: Fluids: none VTE: enoxaparin (LOVENOX) injection 40 mg Start: 07/12/23 0945 Code: Full AB: augmentin, azithromycin Therapy Recs: Pending DISPO: Anticipated discharge tomorrow to Home pending medical improvement and PT eval.  Signature: Annett Fabian, MD  Internal Medicine Resident, PGY-1 [name]  Pager: [phone] 12:32 PM, 07/14/2023   Please contact the on call pager after 5 pm and on weekends at [phone].

## 2023-07-14 NOTE — Plan of Care (Signed)

## 2023-07-14 NOTE — Evaluation (Addendum)
Physical Therapy Evaluation Patient Details Name: [name] [name] MRN: [id number] DOB: July 22, 1955 Today's Date: 07/14/2023  History of Present Illness  Pt is a 67 y/o F admitted on 07/12/23 after presenting with c/o dyspnea & generalized weakness. Pt is being treated for COPD exacerbation. PMH: COPD, HTN, prediabetes, CAD s/p stents, anxiety  Clinical Impression  Pt seen for PT evaluation with pt agreeable to tx. Pt reports prior to first admission she was ambulatory without AD but has been using RW since last admission. Pt is able to ambulate into hallway with RW & supervision with decreased gait speed. Overall gait distance limited by fatigue & SOB despite SpO2 95% on 2L/min.  Encouraged pt to have daughter assist her with cooking, cleaning, etc upon return home. Will continue to follow pt acutely to address endurance, balance, & gait with LRAD.      If plan is discharge home, recommend the following: A little help with walking and/or transfers;A little help with bathing/dressing/bathroom;Assistance with cooking/housework;Assist for transportation;Help with stairs or ramp for entrance   Can travel by private vehicle        Equipment Recommendations None recommended by PT  Recommendations for Other Services    OT consult   Functional Status Assessment Patient has had a recent decline in their functional status and demonstrates the ability to make significant improvements in function in a reasonable and predictable amount of time.     Precautions / Restrictions Precautions Precautions: Fall Restrictions Weight Bearing Restrictions Per Provider Order: No      Mobility  Bed Mobility               General bed mobility comments: not tested, pt received sitting EOB, left sitting in chair    Transfers Overall transfer level: Needs assistance Equipment used: None Transfers: Sit to/from Stand Sit to Stand: Supervision           General transfer comment: STS from EOB,  slight posterior LOB    Ambulation/Gait Ambulation/Gait assistance: Supervision Gait Distance (Feet): 110 Feet Assistive device: Rolling walker (2 wheels) Gait Pattern/deviations: Decreased step length - right, Decreased step length - left, Decreased stride length Gait velocity: decreased     General Gait Details: min cuing to ambulate within base of AD vs pushing out in front of her  Stairs            Wheelchair Mobility     Tilt Bed    Modified Rankin (Stroke Patients Only)       Balance Overall balance assessment: Needs assistance Sitting-balance support: Feet supported Sitting balance-Leahy Scale: Good     Standing balance support: During functional activity, Bilateral upper extremity supported, Reliant on assistive device for balance Standing balance-Leahy Scale: Fair                               Pertinent Vitals/Pain Pain Assessment Pain Assessment: No/denies pain    Home Living Family/patient expects to be discharged to:: Private residence Living Arrangements: Alone Available Help at Discharge: Family;Available PRN/intermittently Type of Home: Apartment Home Access: Level entry       Home Layout: One level Home Equipment: Agricultural consultant (2 wheels);Shower seat      Prior Function Prior Level of Function : Independent/Modified Independent             Mobility Comments: Prior to first admission pt ambulatory without AD, since last admission pt ambulatory with RW.  Extremity/Trunk Assessment   Upper Extremity Assessment Upper Extremity Assessment: Overall WFL for tasks assessed    Lower Extremity Assessment Lower Extremity Assessment: Generalized weakness    Cervical / Trunk Assessment Cervical / Trunk Assessment: Normal  Communication   Communication Communication: No apparent difficulties  Cognition Arousal: Alert Behavior During Therapy: WFL for tasks assessed/performed Overall Cognitive Status: Within  Functional Limits for tasks assessed                                          General Comments General comments (skin integrity, edema, etc.): Pt on 2L/min via nasal cannula, SpO2 95% or >. HR 73 bpm.    Exercises     Assessment/Plan    PT Assessment Patient needs continued PT services  PT Problem List Decreased strength;Decreased activity tolerance;Decreased balance;Decreased mobility;Cardiopulmonary status limiting activity       PT Treatment Interventions DME instruction;Balance training;Modalities;Gait training;Neuromuscular re-education;Functional mobility training;Patient/family education;Therapeutic activities;Therapeutic exercise;Stair training    PT Goals (Current goals can be found in the Care Plan section)  Acute Rehab PT Goals Patient Stated Goal: get better PT Goal Formulation: With patient Time For Goal Achievement: 07/28/23 Potential to Achieve Goals: Good    Frequency Min 1X/week     Co-evaluation               AM-PAC PT "6 Clicks" Mobility  Outcome Measure Help needed turning from your back to your side while in a flat bed without using bedrails?: None Help needed moving from lying on your back to sitting on the side of a flat bed without using bedrails?: None Help needed moving to and from a bed to a chair (including a wheelchair)?: A Little Help needed standing up from a chair using your arms (e.g., wheelchair or bedside chair)?: A Little Help needed to walk in hospital room?: A Little Help needed climbing 3-5 steps with a railing? : A Little 6 Click Score: 20    End of Session Equipment Utilized During Treatment: Oxygen Activity Tolerance: Patient tolerated treatment well;Patient limited by fatigue Patient left:  (sitting on chair in bathroom in handoff to NT) Nurse Communication: Mobility status PT Visit Diagnosis: Muscle weakness (generalized) (M62.81);Difficulty in walking, not elsewhere classified (R26.2)    Time:  1139-1150 PT Time Calculation (min) (ACUTE ONLY): 11 min   Charges:   PT Evaluation $PT Eval Low Complexity: 1 Low   PT General Charges $$ ACUTE PT VISIT: 1 Visit         Aleda Grana, PT, DPT 07/14/23, 12:50 PM   Sandi Mariscal 07/14/2023, 12:49 PM

## 2023-07-14 NOTE — Progress Notes (Signed)
   07/14/23 1113  Spiritual Encounters  Type of Visit Initial  Care provided to: Patient  Referral source Nurse (RN/NT/LPN)  Reason for visit Advance directives  OnCall Visit No  Advance Directives (For Healthcare)  Does Patient Have a Medical Advance Directive? No  Would patient like information on creating a medical advance directive? Yes (Inpatient - patient defers creating a medical advance directive at this time - Information given)   Chaplain provided AD paperwork and education. Patient was unsure if she wanted to complete the paperwork at this time.   Arlyce Dice, Chaplain Resident

## 2023-07-14 NOTE — TOC Initial Note (Signed)
Transition of Care (TOC) - Initial/Assessment Note   Patient from home. At discharge not sure if she is returning to her address ( face sheet address) or her daughters : [address] [place], [place], [place] [address]. She will discuss with her daughter. She is active with [facility] for RN and PT and wants to continue services. Patient states [facility] has been going to her address and her daughters address. Confirmed with Cindie with [facility] . She will need orders and face to face to continue services. Messaged attending team.   Patient has home oxygen with [employer] and has portable oxygen tank. She also has walker, Rollator , shower chair and bedside commode  Patient Details  Name: [name] [name] MRN: [id number] Date of Birth: October 21, 1955  Transition of Care [facility]) CM/SW Contact:    Kingsley Plan, RN Phone Number: 07/14/2023, 3:30 PM  Clinical Narrative:                   Expected Discharge Plan: Home w Home Health Services Barriers to Discharge: Continued Medical Work up   Patient Goals and CMS Choice Patient states their goals for this hospitalization and ongoing recovery are:: to return to home CMS Medicare.gov Compare Post Acute Care list provided to:: Patient Choice offered to / list presented to : Patient [place] ownership interest in [facility].provided to:: Patient    Expected Discharge Plan and Services   Discharge Planning Services: CM Consult Post Acute Care Choice: Home Health Living arrangements for the past 2 months: Single Family Home                 DME Arranged: N/A DME Agency: NA       HH Arranged: PT, RN HH Agency: [facility] Date [facility] Agency Contacted: 07/14/23 Time HH Agency Contacted: 1529 Representative spoke with at [facility] Agency: Cindie , askedattending team  for orders and face to face  Prior Living Arrangements/Services Living arrangements for the past 2 months: Single Family Home Lives with:: Self Patient language and need  for interpreter reviewed:: Yes Do you feel safe going back to the place where you live?: Yes      Need for Family Participation in Patient Care: Yes (Comment) Care giver support system in place?: Yes (comment) Current home services: DME, Home PT, Home RN Criminal Activity/Legal Involvement Pertinent to Current Situation/Hospitalization: No - Comment as needed  Activities of Daily Living   ADL Screening (condition at time of admission) Independently performs ADLs?: Yes (appropriate for developmental age) Is the patient deaf or have difficulty hearing?: No Does the patient have difficulty seeing, even when wearing glasses/contacts?: Yes Does the patient have difficulty concentrating, remembering, or making decisions?: No  Permission Sought/Granted   Permission granted to share information with : Yes, Release of Information Signed     Permission granted to share info w AGENCY: [facility]        Emotional Assessment Appearance:: Appears stated age Attitude/Demeanor/Rapport: Engaged Affect (typically observed): Accepting Orientation: : Oriented to Self, Oriented to Place, Oriented to  Time, Oriented to Situation Alcohol / Substance Use: Not Applicable Psych Involvement: No (comment)  Admission diagnosis:  COPD exacerbation (HCC) [J44.1] COVID-19 virus infection [U07.1] Patient Active Problem List   Diagnosis Date Noted   Infection due to parainfluenza virus 4 06/20/2023   Full code status 06/14/2023   Acute hypoxic respiratory failure (HCC) 06/13/2023   Acute exacerbation of chronic obstructive pulmonary disease (COPD) (HCC) 06/12/2023   Nicotine dependence 05/03/2023  Left ankle sprain 04/17/2023   Substance abuse (HCC) 09/10/2022   Nontraumatic complete tear of right rotator cuff    Superior glenoid labrum lesion of left shoulder    Iron deficiency anemia 11/02/2021   Major depressive disorder, recurrent, in full remission with anxious distress (HCC) 05/31/2021   Rotator cuff  tendinitis, right 05/08/2021   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   Chronic neck pain 02/26/2021   GAD (generalized anxiety disorder) 01/08/2021   Acute cystitis without hematuria 11/07/2020   Influenza vaccine needed 06/15/2020   Abdominal pain, epigastric 04/28/2019   Esophageal dysphagia 04/28/2019   Bipolar I disorder (HCC) 09/17/2018   Insomnia 10/28/2017   Prediabetes 05/28/2017   QT prolongation 03/25/2017   Encounter for screening mammogram for breast cancer 09/11/2015   Psoriasis 06/22/2015   COPD (chronic obstructive pulmonary disease) (HCC)GOLD E 05/18/2015   Anxiety and depression 07/10/2013   Essential hypertension, benign 06/07/2013   Dyslipidemia 06/07/2013   Asthma, chronic 06/07/2013   Emphysema lung (HCC) 06/07/2013   Chronic back pain 06/07/2013   CAD S/P percutaneous coronary angioplasty 06/07/2013   GERD (gastroesophageal reflux disease) 06/07/2013   Breast lump on left side at 3 o'clock position 10/13/2012   S/P abdominal hysterectomy and right salpingo-oophorectomy 08/29/2011   COPD exacerbation (HCC) 09/15/2009   TOBACCO ABUSE 07/17/2009   Chronic rhinitis 07/17/2009   Lung nodule < 6cm on CT 07/17/2009   ALLERGY, FOOD 07/17/2009   Hypothyroidism 12/22/2008   PCP:  [name], MD Pharmacy:   [facility] [address] [place] [address] Phone: [phone] Fax: [phone]  [facility] [place], [place] - [address] [place] [address] Phone: [phone] Fax: [phone]  [facility] [place] [facility] [address] [place] [address] Phone: [phone] Fax: [phone]  [name] [address] [place] [address] Phone: [phone] Fax: [phone]     Social Drivers of Health (SDOH) Social History: SDOH Screenings   Food Insecurity: No  Food Insecurity (07/12/2023)  Housing: Low Risk  (07/12/2023)  Transportation Needs: No Transportation Needs (07/12/2023)  Utilities: Not At Risk (07/12/2023)  Alcohol Screen: Low Risk  (12/24/2022)  Depression (PHQ2-9): Low Risk  (06/27/2023)  Recent Concern: Depression (PHQ2-9) - Medium Risk (04/25/2023)  Financial Resource Strain: Low Risk  (12/24/2022)  Physical Activity: Inactive (12/24/2022)  Social Connections: Moderately Isolated (12/24/2022)  Stress: No Stress Concern Present (12/24/2022)  Tobacco Use: High Risk (07/12/2023)   SDOH Interventions:     Readmission Risk Interventions    06/20/2023    3:17 PM  Readmission Risk Prevention Plan  Transportation Screening Complete  HRI or Home Care Consult Complete  Social Work Consult for Recovery Care Planning/Counseling Complete  Palliative Care Screening Not Applicable  Medication Review Oceanographer) Complete

## 2023-07-15 ENCOUNTER — Other Ambulatory Visit (HOSPITAL_COMMUNITY): Payer: Self-pay

## 2023-07-15 DIAGNOSIS — J441 Chronic obstructive pulmonary disease with (acute) exacerbation: Secondary | ICD-10-CM | POA: Diagnosis not present

## 2023-07-15 MED ORDER — VARENICLINE TARTRATE 1 MG PO TABS
1.0000 mg | ORAL_TABLET | Freq: Two times a day (BID) | ORAL | 0 refills | Status: DC
  Filled 2023-07-15: qty 180, 90d supply, fill #0

## 2023-07-15 MED ORDER — VARENICLINE TARTRATE 0.5 MG PO TABS
ORAL_TABLET | ORAL | 0 refills | Status: DC
  Filled 2023-07-15: qty 11, 7d supply, fill #0

## 2023-07-15 MED ORDER — PREDNISONE 20 MG PO TABS
ORAL_TABLET | ORAL | 0 refills | Status: DC
  Filled 2023-07-15: qty 11, 10d supply, fill #0

## 2023-07-15 MED ORDER — AMOXICILLIN-POT CLAVULANATE 875-125 MG PO TABS
1.0000 | ORAL_TABLET | Freq: Two times a day (BID) | ORAL | 0 refills | Status: DC
  Filled 2023-07-15: qty 9, 5d supply, fill #0

## 2023-07-15 NOTE — Consult Note (Addendum)
 [facility] Liaison Consult Note   07/15/2023  [name] [name] 11/12/1955 [id number]  Insurance:  [employer] Medicare [Hum[employer] SNP, which is followed by [facility] for Care Management - this is an external program patient is in]   Primary Care Provider: Vicci Barnie [name], MD, this provider is listed for the transition of care follow up appointments  and [facility] calls   RN Hospital Liaison came to unit rounds and patient transitioning home with family. Patient on airborne precautions preserving PPE for inpatient staff.   The patient was screened for 30 day readmission hospitalization with noted high risk score for unplanned readmission risk 2 hospital admissions in 6 months.  The patient was assessed for potential Community Care Coordination service needs for post hospital transition for care coordination. Review of patient's electronic medical record reveals patient is for home with [facility].[name]   Plan: [facility] Liaison will continue to follow progress and disposition to asess for post hospital community care coordination/management needs.  Referral request for community care coordination: anticipate TOC calls for follow up   [facility], [facility] does not replace or interfere with any arrangements made by the Inpatient Transition of Care team.   For questions contact:   Richerd Fish, RN, BSN, CCM [place]  [facility], [facility] Liaison Direct Dial: [phone] or secure chat Email: [email or web address]

## 2023-07-15 NOTE — Progress Notes (Signed)
AVS printed and reviewed with patient qustions answered.  Patient getting dressed and will be escoted to the discharge lounge to wait for TOC meds and ride.

## 2023-07-15 NOTE — Progress Notes (Signed)
 OT Cancellation Note  Patient Details Name: JAMYRAH SAUR MRN: [id number] DOB: [birth year]   Cancelled Treatment:    Reason Eval/Treat Not Completed: Other (comment) Pt currently being discharged home and follow up HHPT was recommended. Recommend HHOT referral if needed and defer evaluation to follow up therapy. Thank you for the referral.   Leita Howell, OTR/L,CBIS  Supplemental OT - [facility] and [facility] Secure Chat Preferred   07/15/2023, 10:58 AM

## 2023-07-15 NOTE — Discharge Summary (Signed)
 Name: [name] [name] MRN: [id number] DOB: 17-Aug-1955 67 y.o. PCP: Vicci Barnie [name], MD  Date of Admission: 07/11/2023  3:48 PM Date of Discharge:  07/15/2023 Attending Physician: Dr. Forest  DISCHARGE DIAGNOSIS:  Primary Problem: Acute exacerbation of chronic obstructive pulmonary disease (COPD) [facility])   Hospital Problems: Principal Problem:   Acute exacerbation of chronic obstructive pulmonary disease (COPD) (HCC) Active Problems:   QT prolongation   Acute hypoxic respiratory failure (HCC)    DISCHARGE MEDICATIONS:   Allergies as of 07/15/2023       Reactions   Avocado Anaphylaxis   Latex Shortness Of Breath, Rash   Codeine  Nausea Only        Medication List     STOP taking these medications    ipratropium-albuterol  0.5-2.5 (3) MG/3ML Soln Commonly known as: DUONEB   nicotine  14 mg/24hr patch Commonly known as: NICODERM CQ  - dosed in mg/24 hours       TAKE these medications    AeroChamber MV inhaler Use as instructed   albuterol  108 (90 Base) MCG/ACT inhaler Commonly known as: Ventolin  HFA Inhale 2 puffs into the lungs every 6 (six) hours as needed for wheezing or shortness of breath.   alendronate  35 MG tablet Commonly known as: FOSAMAX  Take 1 tablet (35 mg total) by mouth every 7 (seven) days. Take with a full glass of water on an empty stomach. Sit upright for 30-60 mins after taking What changed:  when to take this additional instructions   amLODipine  5 MG tablet Commonly known as: NORVASC  Take 1 tablet (5 mg total) by mouth daily.   amoxicillin -clavulanate 875-125 MG tablet Commonly known as: AUGMENTIN  Take 1 tablet by mouth every 12 (twelve) hours for 9 doses.   aspirin  EC 81 MG tablet Take 81 mg by mouth daily.   atorvastatin  80 MG tablet Commonly known as: LIPITOR  Take 1 tablet (80 mg total) by mouth daily, please schedule appointment for more refills   Brilinta  60 MG Tabs tablet Generic drug: ticagrelor  Take 1 tablet (60 mg  total) by mouth 2 (two) times daily.   busPIRone  15 MG tablet Commonly known as: BUSPAR  Take 2 tablets (30 mg total) by mouth in the morning AND 1 tablet (15 mg total) daily at 12 noon AND 1 tablet (15 mg total) every evening.   clonazePAM  0.5 MG tablet Commonly known as: KlonoPIN  Take 0.5 tablets (0.25 mg total) by mouth 2 (two) times daily as needed for anxiety.   doxepin  75 MG capsule Commonly known as: SINEQUAN  Take 1 capsule (75 mg total) by mouth at bedtime.   escitalopram  20 MG tablet Commonly known as: LEXAPRO  Take 1 tablet (20 mg total) by mouth daily.   ezetimibe  10 MG tablet Commonly known as: ZETIA  Take 1 tablet (10 mg total) by mouth daily.   furosemide  20 MG tablet Commonly known as: LASIX  Take 1 tablet (20 mg total) by mouth daily.   gabapentin  600 MG tablet Commonly known as: NEURONTIN  TAKE 1 TABLET BY MOUTH 4 TIMES DAILY What changed:  how much to take when to take this   glucose blood test strip Commonly known as: True Metrix Blood Glucose Test Use as instructed   levothyroxine  112 MCG tablet Commonly known as: SYNTHROID  Take 1 tablet (112 mcg total) by mouth daily.   losartan  50 MG tablet Commonly known as: COZAAR  Take 1.5 tablets (75 mg total) by mouth daily.   metFORMIN  500 MG tablet Commonly known as: GLUCOPHAGE  Take 1 tablet (500 mg total) by  mouth 2 (two) times daily with a meal.   mirtazapine  45 MG tablet Commonly known as: REMERON  Take 1 tablet (45 mg total) by mouth at bedtime.   montelukast  10 MG tablet Commonly known as: SINGULAIR  Take 1 tablet (10 mg total) by mouth at bedtime.   nebivolol  10 MG tablet Commonly known as: Bystolic  Take 1 tablet (10 mg total) by mouth daily.   nitroGLYCERIN  0.4 MG SL tablet Commonly known as: NITROSTAT  Place 1 tablet (0.4 mg total) under the tongue every 5 (five) minutes as needed for chest pain.   omeprazole  40 MG capsule Commonly known as: PRILOSEC Take 1 capsule (40 mg total) by mouth 2  (two) times daily before a meal.   predniSONE  20 MG tablet Commonly known as: DELTASONE  Take 2 tablets (40 mg total) by mouth daily with breakfast for 1 day, THEN 1.5 tablets (30 mg total) daily with breakfast for 3 days, THEN 1 tablet (20 mg total) daily with breakfast for 3 days, THEN 0.5 tablets (10 mg total) daily with breakfast for 3 days. Start taking on: July 16, 2023   Trelegy Ellipta  100-62.5-25 MCG/ACT Aepb Generic drug: Fluticasone -Umeclidin-Vilant Inhale 1 puff into the lungs daily.   triamcinolone  0.025 % ointment Commonly known as: KENALOG  Apply 1 Application topically 2 (two) times daily as needed.   True Metrix Meter Devi 1 kit by Does not apply route 4 (four) times daily.   TRUEplus Lancets 28G Misc 28 g by Does not apply route QID.   varenicline  0.5 MG tablet Commonly known as: Chantix  Initial, 0.5 mg orally once daily for 3 days, then 0.5 mg twice daily on days 4 through 7. Then start taking the 1 mg dose twice daily for a total of 12 weeks.   varenicline  1 MG tablet Commonly known as: Chantix  Continuing Month Pak Take 1 tablet (1 mg total) by mouth 2 (two) times daily.        DISPOSITION AND FOLLOW-UP:  [name] [name] was discharged from [facility] in [place] condition. At the hospital follow up visit please address:  Acute COPD exacerbation/suspected bacterial sinusitis: Improved with steroids, duonebs, and antibiotics. Ensure completion of the prednisone  taper and augmentin . Refill trelegy and albuterol  if needed. Screen for any persistent shortness of breath or increased oxygen  requirements.   Tobacco cessation: she continues to smoke despite repeated admission for COPD exacerbations. She states she is ready to quit, so we are discharging her with chantix . Please follow up on this and continue to provide education and counseling regarding tobacco cessation.   Follow-up Appointments:  Follow-up Information     [facility] Follow up.   Specialty: Home Health Services Contact information: [address] [place] [address] [phone]         Vicci Barnie NOVAK, MD Follow up on 08/07/2023.   Specialty: Internal Medicine Why: Please go to your appointment at 9:30 AM on the above date. Contact information: [address] [place] [address] [phone]                HOSPITAL COURSE:  Patient Summary: Acute COPD exacerbation, resolved Suspect bacterial sinusitis, improving She presented to the emergency department for readmission for COPD exacerbation, recently discharged for the same in the setting of parainfluenza infection.  She was sent home previously with 2 L nasal cannula, but felt persistently fatigued and short of breath at home, so she returned to the hospital for evaluation.  On presentation,  she had diffuse inspiratory and expiratory wheezing with poor airflow.  Chest x-ray, ECG, BNP, troponins, and respiratory panel were all negative.  She received 3 DuoNebs in the emergency department in addition to Solu-Medrol  and magnesium .  Her respiratory status significantly improved with these interventions.  She was continued on DuoNebs, azithromycin , and prednisone  for COPD exacerbation.  Augmentin  was added on the following day as she continued to experience sinus pressure and continued congestion and bronchitic symptoms.  She improved significantly over the course of the next few days on this regimen and was restarted on her home inhalers. She was evaluated by physical therapy, who recommended continuation of her home health physical therapy. She was able to ambulate with 2L [place] without desaturations. Orthostatic vitals were negative for hypotension. With her wheezing and shortness of breath improved, she is medically stable for discharge with home health. We will send her with a steroid taper and a 7 day course of antibiotics, as we feel she is at risk of  readmission. She will need close follow up with her primary care physician.    DISCHARGE INSTRUCTIONS:   Discharge Instructions     Call MD for:  difficulty breathing, headache or visual disturbances   Complete by: As directed    Call MD for:  extreme fatigue   Complete by: As directed    Call MD for:  persistant dizziness or light-headedness   Complete by: As directed    Call MD for:  persistant nausea and vomiting   Complete by: As directed    Call MD for:  temperature >100.4   Complete by: As directed    Diet - low sodium heart healthy   Complete by: As directed    Discharge instructions   Complete by: As directed    You were hospitalized for a COPD exacerbation, likely caused by a bacterial respiratory infection. We treated you with steroids, breathing treatments, and antibiotics. Thank you for allowing us  to be part of your care.   Please attend the follow up appointment with your primary care provider, Vicci Barnie NOVAK, MD, scheduled on 08/07/2023 at 9:30 AM.   Please note these changes made to your medications:   Please START taking:   Augmentin  875-125 mg, one tablet twice daily, for a total of nine more doses to complete a 7 day course. Take your first dose this evening (12/31).  Take 2 tablets of Prednisone  (40 mg total) once in the morning with breakfast tomorrow (07/16/2023), THEN 1.5 tablets (30 mg total) daily with breakfast for 3 days, THEN 1 tablet (20 mg total) daily with breakfast for 3 days, THEN 0.5 tablets (10 mg total) daily with breakfast for 3 days.  For smoking cessation, please start taking Chantix . Begin with 0.5 mg once daily for the next 3 days, then take 0.5 mg twice daily for days 4 through 7. After that, take the 1 mg dose twice daily for a total of 12 weeks. It is very important that you quit smoking. This is probably the most important thing you can do for your health and will help prevent future hospitalizations, and this medication can help you do  that.   You may continue taking your other medications as prescribed, including your home inhalers.  You will continue to receive home health services, including physical therapy to increase your strength.   If you experience any worsening shortness of breath, fainting, or confusion, please return to the hospital for evaluation.   Increase activity slowly  Complete by: As directed       SUBJECTIVE:   Patient reports she feels better today. No new shortness of breath. Some wheezing, but less than yesterday. Feels she is ready to go home.   Discharge Vitals:   BP (!) 144/70 (BP Location: Left Arm)   Pulse 84   Temp 98.8 F (37.1 C) (Oral)   Resp 18   Ht 4' 11 (1.499 m)   Wt 67 kg   SpO2 96%   BMI 29.83 kg/m   OBJECTIVE:  Physical Exam Constitutional:      Appearance: She is well-developed.  Cardiovascular:     Rate and Rhythm: Normal rate and regular rhythm.  Pulmonary:     Effort: Pulmonary effort is normal.     Breath sounds: Wheezing present.  Abdominal:     General: Bowel sounds are normal.     Palpations: Abdomen is soft.  Neurological:     General: No focal deficit present.     Mental Status: She is alert and oriented to person, place, and time.     Pertinent Labs, Studies, and Procedures:     Latest Ref Rng & Units 07/13/2023    5:13 AM 07/11/2023    4:41 PM 06/19/2023    2:54 AM  CBC  WBC 4.0 - 10.5 K/uL 8.7  5.8  10.2   Hemoglobin 12.0 - 15.0 g/dL 87.5  86.5  85.6   Hematocrit 36.0 - 46.0 % 38.1  41.8  43.7   Platelets 150 - 400 K/uL 396  376  482        Latest Ref Rng & Units 07/13/2023    5:13 AM 07/11/2023    4:41 PM 06/26/2023    1:39 PM  CMP  Glucose 70 - 99 mg/dL 858  77  65   BUN 8 - 23 mg/dL 20  11  14    Creatinine 0.44 - 1.00 mg/dL 9.26  8.80  9.25   Sodium 135 - 145 mmol/L 137  140  142   Potassium 3.5 - 5.1 mmol/L 4.6  3.8  3.7   Chloride 98 - 111 mmol/L 102  102  99   CO2 22 - 32 mmol/L 27  28  29    Calcium  8.9 - 10.3 mg/dL  9.3  8.5  9.0   Total Protein 6.5 - 8.1 g/dL 5.8   6.2   Total Bilirubin <1.2 mg/dL 0.3   0.4   Alkaline Phos 38 - 126 U/L 76   85   AST 15 - 41 U/L 14   15   ALT 0 - 44 U/L 16   37    Respiratory viral panel: negative COVID: negative  Imaging:  DG Chest 2 View Result Date: 07/11/2023 IMPRESSION: No active cardiopulmonary disease. Electronically Signed   By: Ree Molt M.D.   On: 07/11/2023 17:29    Signed: Ozell Riff, MD Internal Medicine Resident, PGY-1 [facility]  Pager: [phone] 10:12 AM, 07/15/2023

## 2023-07-17 ENCOUNTER — Other Ambulatory Visit: Payer: Self-pay | Admitting: Internal Medicine

## 2023-07-17 ENCOUNTER — Inpatient Hospital Stay: Payer: Medicare HMO | Admitting: Physician Assistant

## 2023-07-17 ENCOUNTER — Telehealth: Payer: Self-pay

## 2023-07-17 DIAGNOSIS — J4489 Other specified chronic obstructive pulmonary disease: Secondary | ICD-10-CM

## 2023-07-17 NOTE — Transitions of Care (Post Inpatient/ED Visit) (Signed)
   07/17/2023  Name: [name] [name] MRN: [id number] DOB: Dec 30, 1955  Today's TOC FU Call Status: Today's TOC FU Call Status:: Unsuccessful Call (1st Attempt) Unsuccessful Call (1st Attempt) Date: 07/17/23  Attempted to reach the patient regarding the most recent Inpatient/ED visit.  Follow Up Plan: Additional outreach attempts will be made to reach the patient to complete the Transitions of Care (Post Inpatient/ED visit) call.   Channing Larry, RN, BA, [facility], CRRN [facility] Coordinator, Transition of Care Ph # [phone]

## 2023-07-18 ENCOUNTER — Telehealth: Payer: Self-pay

## 2023-07-18 NOTE — Transitions of Care (Post Inpatient/ED Visit) (Signed)
   07/18/2023  Name: [name] [name] MRN: [id number] DOB: 01-18-1956  Today's TOC FU Call Status: Today's TOC FU Call Status:: Unsuccessful Call (2nd Attempt) Unsuccessful Call (2nd Attempt) Date: 07/18/23  Attempted to reach the patient regarding the most recent Inpatient/ED visit.  Follow Up Plan: Additional outreach attempts will be made to reach the patient to complete the Transitions of Care (Post Inpatient/ED visit) call.   Channing Larry, RN, BA, [facility], CRRN [facility] Coordinator, Transition of Care Ph # [phone]

## 2023-07-19 DIAGNOSIS — E039 Hypothyroidism, unspecified: Secondary | ICD-10-CM | POA: Diagnosis not present

## 2023-07-19 DIAGNOSIS — J441 Chronic obstructive pulmonary disease with (acute) exacerbation: Secondary | ICD-10-CM | POA: Diagnosis not present

## 2023-07-19 DIAGNOSIS — F32A Depression, unspecified: Secondary | ICD-10-CM | POA: Diagnosis not present

## 2023-07-19 DIAGNOSIS — I2510 Atherosclerotic heart disease of native coronary artery without angina pectoris: Secondary | ICD-10-CM | POA: Diagnosis not present

## 2023-07-19 DIAGNOSIS — J9601 Acute respiratory failure with hypoxia: Secondary | ICD-10-CM | POA: Diagnosis not present

## 2023-07-19 DIAGNOSIS — I110 Hypertensive heart disease with heart failure: Secondary | ICD-10-CM | POA: Diagnosis not present

## 2023-07-19 DIAGNOSIS — I5032 Chronic diastolic (congestive) heart failure: Secondary | ICD-10-CM | POA: Diagnosis not present

## 2023-07-19 DIAGNOSIS — F419 Anxiety disorder, unspecified: Secondary | ICD-10-CM | POA: Diagnosis not present

## 2023-07-19 DIAGNOSIS — R7303 Prediabetes: Secondary | ICD-10-CM | POA: Diagnosis not present

## 2023-07-21 ENCOUNTER — Observation Stay (HOSPITAL_COMMUNITY)
Admission: EM | Admit: 2023-07-21 | Discharge: 2023-07-23 | Disposition: A | Discharge status: Home / Self Care | Payer: Medicare HMO | Attending: Internal Medicine | Admitting: Internal Medicine

## 2023-07-21 ENCOUNTER — Emergency Department (HOSPITAL_COMMUNITY): Payer: Medicare HMO

## 2023-07-21 ENCOUNTER — Encounter (HOSPITAL_COMMUNITY): Payer: Self-pay | Admitting: Emergency Medicine

## 2023-07-21 ENCOUNTER — Other Ambulatory Visit: Payer: Self-pay

## 2023-07-21 ENCOUNTER — Telehealth: Payer: Self-pay

## 2023-07-21 DIAGNOSIS — R911 Solitary pulmonary nodule: Secondary | ICD-10-CM | POA: Insufficient documentation

## 2023-07-21 DIAGNOSIS — I110 Hypertensive heart disease with heart failure: Secondary | ICD-10-CM | POA: Diagnosis not present

## 2023-07-21 DIAGNOSIS — D72829 Elevated white blood cell count, unspecified: Secondary | ICD-10-CM | POA: Diagnosis not present

## 2023-07-21 DIAGNOSIS — J441 Chronic obstructive pulmonary disease with (acute) exacerbation: Principal | ICD-10-CM | POA: Diagnosis present

## 2023-07-21 DIAGNOSIS — I5030 Unspecified diastolic (congestive) heart failure: Secondary | ICD-10-CM | POA: Insufficient documentation

## 2023-07-21 DIAGNOSIS — R7303 Prediabetes: Secondary | ICD-10-CM | POA: Diagnosis not present

## 2023-07-21 DIAGNOSIS — R062 Wheezing: Secondary | ICD-10-CM | POA: Diagnosis not present

## 2023-07-21 DIAGNOSIS — F419 Anxiety disorder, unspecified: Secondary | ICD-10-CM | POA: Diagnosis not present

## 2023-07-21 DIAGNOSIS — I10 Essential (primary) hypertension: Secondary | ICD-10-CM | POA: Diagnosis not present

## 2023-07-21 DIAGNOSIS — R0902 Hypoxemia: Secondary | ICD-10-CM | POA: Insufficient documentation

## 2023-07-21 DIAGNOSIS — B348 Other viral infections of unspecified site: Secondary | ICD-10-CM | POA: Insufficient documentation

## 2023-07-21 DIAGNOSIS — R0689 Other abnormalities of breathing: Secondary | ICD-10-CM | POA: Diagnosis not present

## 2023-07-21 DIAGNOSIS — B341 Enterovirus infection, unspecified: Secondary | ICD-10-CM | POA: Insufficient documentation

## 2023-07-21 DIAGNOSIS — Z91040 Latex allergy status: Secondary | ICD-10-CM | POA: Diagnosis not present

## 2023-07-21 DIAGNOSIS — Z79899 Other long term (current) drug therapy: Secondary | ICD-10-CM | POA: Insufficient documentation

## 2023-07-21 DIAGNOSIS — F32A Depression, unspecified: Secondary | ICD-10-CM | POA: Diagnosis present

## 2023-07-21 DIAGNOSIS — Z20822 Contact with and (suspected) exposure to covid-19: Secondary | ICD-10-CM | POA: Diagnosis not present

## 2023-07-21 DIAGNOSIS — I700 Atherosclerosis of aorta: Secondary | ICD-10-CM | POA: Diagnosis not present

## 2023-07-21 DIAGNOSIS — Z955 Presence of coronary angioplasty implant and graft: Secondary | ICD-10-CM | POA: Insufficient documentation

## 2023-07-21 DIAGNOSIS — E039 Hypothyroidism, unspecified: Secondary | ICD-10-CM | POA: Diagnosis not present

## 2023-07-21 DIAGNOSIS — J449 Chronic obstructive pulmonary disease, unspecified: Secondary | ICD-10-CM | POA: Diagnosis present

## 2023-07-21 DIAGNOSIS — Z7982 Long term (current) use of aspirin: Secondary | ICD-10-CM | POA: Diagnosis not present

## 2023-07-21 DIAGNOSIS — E785 Hyperlipidemia, unspecified: Secondary | ICD-10-CM | POA: Diagnosis not present

## 2023-07-21 DIAGNOSIS — R0602 Shortness of breath: Secondary | ICD-10-CM | POA: Diagnosis not present

## 2023-07-21 LAB — CBC
HCT: 39.9 % (ref 36.0–46.0)
Hemoglobin: 13.2 g/dL (ref 12.0–15.0)
MCH: 32.3 pg (ref 26.0–34.0)
MCHC: 33.1 g/dL (ref 30.0–36.0)
MCV: 97.6 fL (ref 80.0–100.0)
Platelets: 555 10*3/uL — ABNORMAL HIGH (ref 150–400)
RBC: 4.09 MIL/uL (ref 3.87–5.11)
RDW: 14.3 % (ref 11.5–15.5)
WBC: 17.2 10*3/uL — ABNORMAL HIGH (ref 4.0–10.5)
nRBC: 0 % (ref 0.0–0.2)

## 2023-07-21 LAB — COMPREHENSIVE METABOLIC PANEL
ALT: 19 U/L (ref 0–44)
AST: 18 U/L (ref 15–41)
Albumin: 3.2 g/dL — ABNORMAL LOW (ref 3.5–5.0)
Alkaline Phosphatase: 87 U/L (ref 38–126)
Anion gap: 12 (ref 5–15)
BUN: 6 mg/dL — ABNORMAL LOW (ref 8–23)
CO2: 26 mmol/L (ref 22–32)
Calcium: 9.1 mg/dL (ref 8.9–10.3)
Chloride: 101 mmol/L (ref 98–111)
Creatinine, Ser: 0.64 mg/dL (ref 0.44–1.00)
GFR, Estimated: >60 mL/min (ref >60)
Glucose, Bld: 138 mg/dL — ABNORMAL HIGH (ref 70–99)
Potassium: 3.9 mmol/L (ref 3.5–5.1)
Sodium: 139 mmol/L (ref 135–145)
Total Bilirubin: 0.6 mg/dL (ref 0.0–1.2)
Total Protein: 6.5 g/dL (ref 6.5–8.1)

## 2023-07-21 LAB — RESP PANEL BY RT-PCR (RSV, FLU A&B, COVID)  RVPGX2
Influenza A by PCR: NEGATIVE
Influenza B by PCR: NEGATIVE
Resp Syncytial Virus by PCR: NEGATIVE
SARS Coronavirus 2 by RT PCR: NEGATIVE

## 2023-07-21 LAB — BRAIN NATRIURETIC PEPTIDE: B Natriuretic Peptide: 182.4 pg/mL — ABNORMAL HIGH (ref 0.0–100.0)

## 2023-07-21 MED ORDER — LORAZEPAM 2 MG/ML IJ SOLN
0.5000 mg | Freq: Once | INTRAMUSCULAR | Status: AC
  Administered 2023-07-21: 0.5 mg via INTRAVENOUS
  Filled 2023-07-21: qty 1

## 2023-07-21 MED ORDER — POLYETHYLENE GLYCOL 3350 17 G PO PACK: 17.0000 g | PACK | Freq: Every day | ORAL | Status: DC | PRN

## 2023-07-21 MED ORDER — ALBUTEROL SULFATE (2.5 MG/3ML) 0.083% IN NEBU
5.0000 mg | INHALATION_SOLUTION | RESPIRATORY_TRACT | Status: AC
  Administered 2023-07-21: 5 mg via RESPIRATORY_TRACT
  Filled 2023-07-21: qty 6

## 2023-07-21 MED ORDER — ACETAMINOPHEN 325 MG PO TABS
650.0000 mg | ORAL_TABLET | Freq: Four times a day (QID) | ORAL | Status: DC | PRN
Start: 2023-07-21 — End: 2023-07-23

## 2023-07-21 MED ORDER — CLONAZEPAM 0.25 MG PO TBDP
0.2500 mg | ORAL_TABLET | Freq: Two times a day (BID) | ORAL | Status: DC
  Administered 2023-07-21 – 2023-07-22 (×2): 0.25 mg via ORAL
  Filled 2023-07-21: qty 2
  Filled 2023-07-21: qty 1

## 2023-07-21 MED ORDER — PREDNISONE 20 MG PO TABS
40.0000 mg | ORAL_TABLET | Freq: Every day | ORAL | Status: DC
  Administered 2023-07-21 – 2023-07-23 (×2): 40 mg via ORAL
  Filled 2023-07-21 (×3): qty 2

## 2023-07-21 MED ORDER — LEVOTHYROXINE SODIUM 112 MCG PO TABS
112.0000 ug | ORAL_TABLET | Freq: Every day | ORAL | Status: DC
  Administered 2023-07-22 – 2023-07-23 (×2): 112 ug via ORAL
  Filled 2023-07-21 (×2): qty 1

## 2023-07-21 MED ORDER — EZETIMIBE 10 MG PO TABS
10.0000 mg | ORAL_TABLET | Freq: Every day | ORAL | Status: DC
  Administered 2023-07-21 – 2023-07-22 (×2): 10 mg via ORAL
  Filled 2023-07-21 (×2): qty 1

## 2023-07-21 MED ORDER — TICAGRELOR 60 MG PO TABS
60.0000 mg | ORAL_TABLET | Freq: Two times a day (BID) | ORAL | Status: DC
  Administered 2023-07-21 – 2023-07-23 (×4): 60 mg via ORAL
  Filled 2023-07-21 (×5): qty 1

## 2023-07-21 MED ORDER — IPRATROPIUM-ALBUTEROL 0.5-2.5 (3) MG/3ML IN SOLN
3.0000 mL | RESPIRATORY_TRACT | Status: AC
  Administered 2023-07-21: 3 mL via RESPIRATORY_TRACT
  Filled 2023-07-21: qty 3

## 2023-07-21 MED ORDER — ACETAMINOPHEN 650 MG RE SUPP: 650.0000 mg | Freq: Four times a day (QID) | RECTAL | Status: DC | PRN

## 2023-07-21 MED ORDER — ESCITALOPRAM OXALATE 20 MG PO TABS
20.0000 mg | ORAL_TABLET | Freq: Every morning | ORAL | Status: DC
  Administered 2023-07-22 – 2023-07-23 (×2): 20 mg via ORAL
  Filled 2023-07-21 (×2): qty 1

## 2023-07-21 MED ORDER — PANTOPRAZOLE SODIUM 40 MG PO TBEC
40.0000 mg | DELAYED_RELEASE_TABLET | Freq: Every day | ORAL | Status: DC
  Administered 2023-07-22 – 2023-07-23 (×2): 40 mg via ORAL
  Filled 2023-07-21 (×2): qty 1

## 2023-07-21 MED ORDER — AMLODIPINE BESYLATE 5 MG PO TABS
5.0000 mg | ORAL_TABLET | Freq: Every morning | ORAL | Status: DC
  Administered 2023-07-22 – 2023-07-23 (×2): 5 mg via ORAL
  Filled 2023-07-21 (×2): qty 1

## 2023-07-21 MED ORDER — ASPIRIN 81 MG PO TBEC
81.0000 mg | DELAYED_RELEASE_TABLET | Freq: Every morning | ORAL | Status: DC
  Administered 2023-07-22 – 2023-07-23 (×2): 81 mg via ORAL
  Filled 2023-07-21 (×2): qty 1

## 2023-07-21 MED ORDER — MIRTAZAPINE 15 MG PO TABS
45.0000 mg | ORAL_TABLET | Freq: Every day | ORAL | Status: DC
  Administered 2023-07-21 – 2023-07-22 (×2): 45 mg via ORAL
  Filled 2023-07-21 (×2): qty 3

## 2023-07-21 MED ORDER — FLUTICASONE FUROATE-VILANTEROL 100-25 MCG/ACT IN AEPB
1.0000 | INHALATION_SPRAY | Freq: Every day | RESPIRATORY_TRACT | Status: DC
  Administered 2023-07-22: 1 via RESPIRATORY_TRACT
  Filled 2023-07-21: qty 28

## 2023-07-21 MED ORDER — DOXEPIN HCL 25 MG PO CAPS
75.0000 mg | ORAL_CAPSULE | Freq: Every day | ORAL | Status: DC
  Administered 2023-07-22 (×2): 75 mg via ORAL
  Filled 2023-07-21 (×3): qty 3
  Filled 2023-07-21: qty 1

## 2023-07-21 MED ORDER — LOSARTAN POTASSIUM 50 MG PO TABS
75.0000 mg | ORAL_TABLET | Freq: Every morning | ORAL | Status: DC
  Administered 2023-07-22 – 2023-07-23 (×2): 75 mg via ORAL
  Filled 2023-07-21 (×3): qty 1

## 2023-07-21 MED ORDER — GABAPENTIN 300 MG PO CAPS
600.0000 mg | ORAL_CAPSULE | Freq: Two times a day (BID) | ORAL | Status: DC
  Administered 2023-07-21 – 2023-07-23 (×4): 600 mg via ORAL
  Filled 2023-07-21 (×4): qty 2

## 2023-07-21 MED ORDER — MONTELUKAST SODIUM 10 MG PO TABS
10.0000 mg | ORAL_TABLET | Freq: Every day | ORAL | Status: DC
  Administered 2023-07-21 – 2023-07-22 (×2): 10 mg via ORAL
  Filled 2023-07-21 (×2): qty 1

## 2023-07-21 MED ORDER — IPRATROPIUM-ALBUTEROL 0.5-2.5 (3) MG/3ML IN SOLN
3.0000 mL | Freq: Four times a day (QID) | RESPIRATORY_TRACT | Status: DC
  Administered 2023-07-21 – 2023-07-22 (×3): 3 mL via RESPIRATORY_TRACT
  Filled 2023-07-21 (×3): qty 3

## 2023-07-21 MED ORDER — RIVAROXABAN 10 MG PO TABS: 10.0000 mg | ORAL_TABLET | Freq: Every day | ORAL | Status: DC

## 2023-07-21 MED ORDER — DOXYCYCLINE HYCLATE 100 MG PO TABS
100.0000 mg | ORAL_TABLET | Freq: Once | ORAL | Status: AC
  Administered 2023-07-21: 100 mg via ORAL
  Filled 2023-07-21: qty 1

## 2023-07-21 MED ORDER — FUROSEMIDE 20 MG PO TABS
20.0000 mg | ORAL_TABLET | Freq: Every morning | ORAL | Status: DC
  Administered 2023-07-22 – 2023-07-23 (×2): 20 mg via ORAL
  Filled 2023-07-21 (×2): qty 1

## 2023-07-21 MED ORDER — NEBIVOLOL HCL 10 MG PO TABS
10.0000 mg | ORAL_TABLET | Freq: Every day | ORAL | Status: DC
  Administered 2023-07-21 – 2023-07-22 (×2): 10 mg via ORAL
  Filled 2023-07-21 (×2): qty 1

## 2023-07-21 MED ORDER — BUSPIRONE HCL 15 MG PO TABS
60.0000 mg | ORAL_TABLET | Freq: Every day | ORAL | Status: DC
  Administered 2023-07-21 – 2023-07-22 (×2): 60 mg via ORAL
  Filled 2023-07-21: qty 4
  Filled 2023-07-21: qty 6
  Filled 2023-07-21: qty 12
  Filled 2023-07-21: qty 4

## 2023-07-21 MED ORDER — ATORVASTATIN CALCIUM 80 MG PO TABS
80.0000 mg | ORAL_TABLET | Freq: Every day | ORAL | Status: DC
  Administered 2023-07-22 – 2023-07-23 (×2): 80 mg via ORAL
  Filled 2023-07-21 (×2): qty 1

## 2023-07-21 MED ORDER — UMECLIDINIUM BROMIDE 62.5 MCG/ACT IN AEPB
1.0000 | INHALATION_SPRAY | Freq: Every day | RESPIRATORY_TRACT | Status: DC
  Administered 2023-07-22: 1 via RESPIRATORY_TRACT
  Filled 2023-07-21: qty 7

## 2023-07-21 MED ORDER — NICOTINE 14 MG/24HR TD PT24
14.0000 mg | MEDICATED_PATCH | Freq: Every day | TRANSDERMAL | Status: DC
  Administered 2023-07-22 – 2023-07-23 (×2): 14 mg via TRANSDERMAL
  Filled 2023-07-21 (×2): qty 1

## 2023-07-21 NOTE — ED Provider Notes (Signed)
 [facility] Provider Note   CSN: [id number] Arrival date & time: 07/21/23  1643     History  Chief Complaint  Patient presents with   Shortness of Breath    Patient from home, recently seen for COPD, continued shortness of breath     [name] [name] is a 68 y.o. female.  68 year old female with a history of COPD on 2 L nasal cannula who presents emergency department shortness of breath.  Patient was hospitalized and discharged on 07/15/2023 for COPD exacerbation.  Was supposed to be sent home on a 10-day taper but says that she only got 2 pills that she took the day after leaving.  Since then has been feeling worse and has had a dry cough with shortness of breath.  Says that she called 911 last night because she felt very anxious and though she could not breathe.  Says that today she also was having difficulty breathing so decided to come back to the emergency department.       Home Medications Prior to Admission medications   Medication Sig Start Date End Date Taking? Authorizing Provider  albuterol  (VENTOLIN  HFA) 108 (90 Base) MCG/ACT inhaler INHALE 2 PUFFS INTO THE LUNGS EVERY 6 HOURS AS NEEDED FOR WHEEZING OR SHORTNESS OF BREATH 07/18/23  Yes Vicci Barnie [name], MD  alendronate  (FOSAMAX ) 35 MG tablet Take 1 tablet (35 mg total) by mouth every 7 (seven) days. Take with a full glass of water on an empty stomach. Sit upright for 30-60 mins after taking Patient taking differently: Take 35 mg by mouth every Wednesday. 02/27/23  Yes Vicci Barnie [name], MD  amLODipine  (NORVASC ) 5 MG tablet Take 1 tablet (5 mg total) by mouth daily. Patient taking differently: Take 5 mg by mouth in the morning. 09/18/22  Yes Monge, Damien BROCKS, NP  aspirin  EC 81 MG tablet Take 81 mg by mouth in the morning.   Yes [provider]  atorvastatin  (LIPITOR ) 80 MG tablet Take 1 tablet (80 mg total) by mouth daily, please schedule appointment for more refills 09/18/22   Yes Monge, Damien BROCKS, NP  busPIRone  (BUSPAR ) 15 MG tablet Take 2 tablets (30 mg total) by mouth in the morning AND 1 tablet (15 mg total) daily at 12 noon AND 1 tablet (15 mg total) every evening. Patient taking differently: Take 2 tablets (30 mg total) by mouth in the morning AND 1 tablet (15 mg total) daily at 12 noon AND 1 tablet (15 mg total) every evening. (Patient takes 4 tabs every night at bedtime) 06/27/23  Yes Nwoko, Uchenna E, PA  clonazePAM  (KLONOPIN ) 0.5 MG tablet Take 0.5 tablets (0.25 mg total) by mouth 2 (two) times daily as needed for anxiety. 07/12/23  Yes Nwoko, Uchenna E, PA  doxepin  (SINEQUAN ) 75 MG capsule Take 1 capsule (75 mg total) by mouth at bedtime. 06/27/23  Yes Nwoko, Uchenna E, PA  escitalopram  (LEXAPRO ) 20 MG tablet Take 1 tablet (20 mg total) by mouth daily. Patient taking differently: Take 20 mg by mouth in the morning. 06/27/23  Yes Nwoko, Uchenna E, PA  ezetimibe  (ZETIA ) 10 MG tablet Take 1 tablet (10 mg total) by mouth daily. Patient taking differently: Take 10 mg by mouth at bedtime. 07/04/23  Yes Meng, Hao, PA  Fluticasone -Umeclidin-Vilant (TRELEGY ELLIPTA ) 100-62.5-25 MCG/ACT AEPB Inhale 1 puff into the lungs daily. 06/25/23  Yes Vicci Barnie [name], MD  furosemide  (LASIX ) 20 MG tablet Take 1 tablet (20 mg total) by mouth  daily. Patient taking differently: Take 20 mg by mouth in the morning. 09/18/22  Yes Monge, Damien BROCKS, NP  gabapentin  (NEURONTIN ) 600 MG tablet TAKE 1 TABLET BY MOUTH 4 TIMES DAILY Patient taking differently: Take 600 mg by mouth 2 (two) times daily. 12/20/22  Yes Vicci Barnie NOVAK, MD  ipratropium-albuterol  (DUONEB) 0.5-2.5 (3) MG/3ML SOLN Take 3 mLs by nebulization every 6 (six) hours as needed.   Yes [provider]  levothyroxine  (SYNTHROID ) 112 MCG tablet Take 1 tablet (112 mcg total) by mouth daily. Patient taking differently: Take 112 mcg by mouth in the morning. 12/20/22  Yes Vicci Barnie NOVAK, MD  losartan  (COZAAR ) 50 MG tablet Take  1.5 tablets (75 mg total) by mouth daily. Patient taking differently: Take 75 mg by mouth in the morning. 09/18/22  Yes Monge, Damien BROCKS, NP  metFORMIN  (GLUCOPHAGE ) 500 MG tablet Take 1 tablet (500 mg total) by mouth 2 (two) times daily with a meal. Patient taking differently: Take 500 mg by mouth in the morning. 04/23/23  Yes Vicci Barnie NOVAK, MD  mirtazapine  (REMERON ) 45 MG tablet Take 1 tablet (45 mg total) by mouth at bedtime. 06/27/23  Yes Nwoko, Uchenna E, PA  mometasone -formoterol  (DULERA ) 100-5 MCG/ACT AERO Inhale 2 puffs into the lungs daily as needed for wheezing or shortness of breath.   Yes [provider]  montelukast  (SINGULAIR ) 10 MG tablet Take 1 tablet (10 mg total) by mouth at bedtime. 12/20/22  Yes Vicci Barnie NOVAK, MD  nebivolol  (BYSTOLIC ) 10 MG tablet Take 1 tablet (10 mg total) by mouth daily. Patient taking differently: Take 10 mg by mouth at bedtime. 09/18/22  Yes Monge, Damien BROCKS, NP  omeprazole  (PRILOSEC) 40 MG capsule Take 1 capsule (40 mg total) by mouth 2 (two) times daily before a meal. 02/19/23  Yes Lemmon, Delon Gibson, PA  predniSONE  (DELTASONE ) 20 MG tablet Take 2 tablets (40 mg total) by mouth daily with breakfast for 1 day, THEN 1.5 tablets (30 mg total) daily with breakfast for 3 days, THEN 1 tablet (20 mg total) daily with breakfast for 3 days, THEN 0.5 tablets (10 mg total) daily with breakfast for 3 days. 07/16/23 07/26/23 Yes Gregary Sharper, MD  Spacer/Aero-Holding Raguel FRENCH Use with the albuterol  inhaler. 07/22/23  Yes Norrine Sharper, MD  ticagrelor  (BRILINTA ) 60 MG TABS tablet Take 1 tablet (60 mg total) by mouth 2 (two) times daily. 09/18/22  Yes Monge, Damien BROCKS, NP  varenicline  (CHANTIX ) 0.5 MG tablet Initial, 0.5 mg orally once daily for 3 days, then 0.5 mg twice daily on days 4 through 7. Then start taking the 1 mg dose twice daily for a total of 12 weeks. Patient taking differently: Take 0.5 mg by mouth See admin instructions. Initial, 0.5 mg orally  once daily for 3 days, then 0.5 mg twice daily on days 4 through 7. Then start taking the 1 mg dose twice daily for a total of 12 weeks. 07/15/23  Yes Gregary Sharper, MD  Blood Glucose Monitoring Suppl (TRUE METRIX METER) DEVI 1 kit by Does not apply route 4 (four) times daily. 04/04/17   Marcus [name] RAMAN, PA-C  glucose blood (TRUE METRIX BLOOD GLUCOSE TEST) test strip Use as instructed 01/21/18   Vicci Barnie NOVAK, MD  nitroGLYCERIN  (NITROSTAT ) 0.4 MG SL tablet Place 1 tablet (0.4 mg total) under the tongue every 5 (five) minutes as needed for chest pain. Patient not taking: Reported on 07/21/2023 09/18/22 07/26/23  Daneen Damien BROCKS, NP  Spacer/Aero-Holding Chambers (AEROCHAMBER MV)  inhaler Use as instructed 04/26/22   Van Knee, MD  triamcinolone  (KENALOG ) 0.025 % ointment Apply 1 Application topically 2 (two) times daily as needed. Patient not taking: Reported on 07/21/2023 01/17/23   Vicci Barnie NOVAK, MD  TRUEPLUS LANCETS 28G MISC 28 g by Does not apply route QID. 04/04/17   Marcus [name] RAMAN, PA-C  varenicline  (CHANTIX  CONTINUING MONTH PAK) 1 MG tablet Take 1 tablet (1 mg total) by mouth 2 (two) times daily. Patient not taking: Reported on 07/21/2023 07/15/23 10/13/23  Gregary Sharper, MD      Allergies    Avocado, Latex, and Codeine     Review of Systems   Review of Systems  Physical Exam Updated Vital Signs BP 137/67 (BP Location: Left Arm)   Pulse 83   Temp 98 F (36.7 C) (Oral)   Resp 20   Ht 4' 9 (1.448 m)   Wt 66.7 kg   SpO2 97%   BMI 31.81 kg/m  Physical Exam Vitals and nursing note reviewed.  Constitutional:      General: She is not in acute distress.    Appearance: She is well-developed.  HENT:     Head: Normocephalic and atraumatic.     Right Ear: External ear normal.     Left Ear: External ear normal.     Nose: Nose normal.  Eyes:     Extraocular Movements: Extraocular movements intact.     Conjunctiva/sclera: Conjunctivae normal.     Pupils: Pupils are equal,  round, and reactive to light.  Cardiovascular:     Rate and Rhythm: Normal rate and regular rhythm.     Heart sounds: No murmur heard. Pulmonary:     Effort: Pulmonary effort is normal. No respiratory distress.     Breath sounds: Wheezing present.     Comments: On 3 L nasal cannula Musculoskeletal:     Cervical back: Normal range of motion and neck supple.     Right lower leg: No edema.     Left lower leg: No edema.  Skin:    General: Skin is warm and dry.  Neurological:     Mental Status: She is alert and oriented to person, place, and time. Mental status is at baseline.  Psychiatric:        Mood and Affect: Mood normal.     ED Results / Procedures / Treatments   Labs (all labs ordered are listed, but only abnormal results are displayed) Labs Reviewed  COMPREHENSIVE METABOLIC PANEL - Abnormal; Notable for the following components:      Result Value   Glucose, Bld 138 (*)    BUN 6 (*)    Albumin 3.2 (*)    All other components within normal limits  CBC - Abnormal; Notable for the following components:   WBC 17.2 (*)    Platelets 555 (*)    All other components within normal limits  BRAIN NATRIURETIC PEPTIDE - Abnormal; Notable for the following components:   B Natriuretic Peptide 182.4 (*)    All other components within normal limits  COMPREHENSIVE METABOLIC PANEL - Abnormal; Notable for the following components:   Glucose, Bld 228 (*)    Albumin 3.0 (*)    All other components within normal limits  CBC - Abnormal; Notable for the following components:   WBC 16.3 (*)    Platelets 566 (*)    All other components within normal limits  BLOOD GAS, VENOUS - Abnormal; Notable for the following components:   pCO2, Ven 39 (*)  pO2, Ven 135 (*)    All other components within normal limits  RESP PANEL BY RT-PCR (RSV, FLU A&B, COVID)  RVPGX2  RESPIRATORY PANEL BY PCR  CBC  BASIC METABOLIC PANEL  MAGNESIUM     EKG EKG Interpretation Date/Time:  Monday July 21 2023  16:44:25 EST Ventricular Rate:  88 PR Interval:  141 QRS Duration:  82 QT Interval:  379 QTC Calculation: 459 R Axis:   85  Text Interpretation: Sinus rhythm Borderline right axis deviation Confirmed by Yolande Charleston [phone]) on 07/21/2023 5:15:32 PM  Radiology DG Chest Port 1 View Result Date: 07/21/2023 CLINICAL DATA:  Shortness of breath EXAM: PORTABLE CHEST 1 VIEW COMPARISON:  07/11/2023 FINDINGS: Cardiac shadow is within normal limits. Aortic calcifications are seen. The lungs are well aerated bilaterally. No focal infiltrate or sizable effusion is noted. No acute bony abnormality is noted. Old healed clavicular fracture is noted. IMPRESSION: No active disease. Electronically Signed   By: Oneil Devonshire M.D.   On: 07/21/2023 18:16    Procedures Procedures    Medications Ordered in ED Medications  ipratropium-albuterol  (DUONEB) 0.5-2.5 (3) MG/3ML nebulizer solution 3 mL ( Nebulization Canceled Entry 07/21/23 2216)  acetaminophen  (TYLENOL ) tablet 650 mg (has no administration in time range)    Or  acetaminophen  (TYLENOL ) suppository 650 mg (has no administration in time range)  polyethylene glycol (MIRALAX  / GLYCOLAX ) packet 17 g (has no administration in time range)  predniSONE  (DELTASONE ) tablet 40 mg (40 mg Oral Given 07/21/23 2222)  amLODipine  (NORVASC ) tablet 5 mg (5 mg Oral Given 07/22/23 0609)  aspirin  EC tablet 81 mg (81 mg Oral Given 07/22/23 0610)  atorvastatin  (LIPITOR ) tablet 80 mg (80 mg Oral Given 07/22/23 0836)  busPIRone  (BUSPAR ) tablet 60 mg (60 mg Oral Given 07/21/23 2221)  doxepin  (SINEQUAN ) capsule 75 mg (75 mg Oral Given 07/22/23 0008)  escitalopram  (LEXAPRO ) tablet 20 mg (20 mg Oral Given 07/22/23 0610)  ezetimibe  (ZETIA ) tablet 10 mg (10 mg Oral Given 07/21/23 2222)  furosemide  (LASIX ) tablet 20 mg (20 mg Oral Given 07/22/23 9391)  gabapentin  (NEURONTIN ) capsule 600 mg (600 mg Oral Given 07/22/23 0836)  levothyroxine  (SYNTHROID ) tablet 112 mcg (112 mcg Oral Given 07/22/23 0540)   losartan  (COZAAR ) tablet 75 mg (75 mg Oral Given 07/22/23 0608)  mirtazapine  (REMERON ) tablet 45 mg (45 mg Oral Given 07/21/23 2222)  montelukast  (SINGULAIR ) tablet 10 mg (10 mg Oral Given 07/21/23 2221)  ticagrelor  (BRILINTA ) tablet 60 mg (60 mg Oral Given 07/22/23 0836)  pantoprazole  (PROTONIX ) EC tablet 40 mg (40 mg Oral Given 07/22/23 0836)  nebivolol  (BYSTOLIC ) tablet 10 mg (10 mg Oral Given 07/21/23 2332)  nicotine  (NICODERM CQ  - dosed in mg/24 hours) patch 14 mg (14 mg Transdermal Patch Applied 07/22/23 0836)  rivaroxaban  (XARELTO ) tablet 10 mg (10 mg Oral Given 07/22/23 1107)  clonazePAM  (KLONOPIN ) disintegrating tablet 0.5 mg (has no administration in time range)  budesonide  (PULMICORT ) nebulizer solution 0.25 mg (0 mg Nebulization Hold 07/22/23 1146)  revefenacin  (YUPELRI ) nebulizer solution 175 mcg (175 mcg Nebulization Given 07/22/23 1154)  arformoterol  (BROVANA ) nebulizer solution 15 mcg (0 mcg Nebulization Hold 07/22/23 1147)  ipratropium-albuterol  (DUONEB) 0.5-2.5 (3) MG/3ML nebulizer solution 3 mL (3 mLs Nebulization Given 07/22/23 1456)  LORazepam  (ATIVAN ) injection 0.5 mg (0.5 mg Intravenous Given 07/21/23 1841)  albuterol  (PROVENTIL ) (2.5 MG/3ML) 0.083% nebulizer solution 5 mg (5 mg Nebulization Given 07/21/23 2047)  LORazepam  (ATIVAN ) injection 0.5 mg (0.5 mg Intravenous Given 07/21/23 2047)  doxycycline  (VIBRA -TABS) tablet 100 mg (100 mg Oral Given  07/21/23 2046)  AeroChamber Plus Flo-Vu Medium MISC 1 each (1 each Other Given 07/22/23 1103)    ED Course/ Medical Decision Making/ A&P Clinical Course as of 07/22/23 1913  Mon Jul 21, 2023  2056 Tobie with teaching to admit the patient with Dr. Rosan as attending. [RP]    Clinical Course User Index [RP] Yolande Lamar BROCKS, MD                                 Medical Decision Making Amount and/or Complexity of Data Reviewed Labs: ordered. Radiology: ordered.  Risk Prescription drug management. Decision regarding hospitalization.   [name] [name] is a 68 y.o. female with comorbidities that complicate the patient evaluation including COPD on 2 L home oxygen  who presents emergency department with shortness of breath and wheezing  Initial Ddx:  COPD exacerbation, URI, pneumonia, PE  MDM:  Feel the patient likely has a COPD exacerbation based on their symptoms.  Will obtain a COVID and flu to assess for URI.  Will also obtain a chest x-ray to evaluate for pneumonia or any other causes of their shortness of breath.  Given their findings on lung auscultation feel that the cause is likely due to obstructive airway disease rather than a pulmonary embolism.  Will give antibiotics based on the increased sputum production and dyspnea.  Plan:  Labs Chest x-ray COVID/flu Steroids Nebs Antibiotics  ED Summary/Re-evaluation:  Patient given steroids and DuoNeb.  Also given doxycycline  for COPD exacerbation after chest x-ray did not show pneumonia.  COVID and flu negative.  Got Ativan  for anxiety.  Patient does have 3 L oxygen  requirement now and is very anxious about going home.  Will admit to internal medicine teaching service.  Patient counseled on the importance of taking the full course of her prednisone .  This patient presents to the ED for concern of complaints listed in HPI, this involves an extensive number of treatment options, and is a complaint that carries with it a high risk of complications and morbidity. Disposition including potential need for admission considered.   Dispo: Admit to Floor  Records reviewed Outpatient Clinic Notes The following labs were independently interpreted: Chemistry and show no acute abnormality I independently reviewed the following imaging with scope of interpretation limited to determining acute life threatening conditions related to emergency care: Chest x-ray and agree with the radiologist interpretation with the following exceptions: none I personally reviewed and interpreted the pt's EKG: see  above for interpretation  I have reviewed the patients home medications and made adjustments as needed Consults: Hospitalist Social Determinants of health:  Elderly    Final Clinical Impression(s) / ED Diagnoses Final diagnoses:  COPD exacerbation (HCC)  Hypoxia    Rx / DC Orders ED Discharge Orders          Ordered    Spacer/Aero-Holding Chambers [facility]        07/22/23 1128              Yolande Lamar BROCKS, MD 07/22/23 1913

## 2023-07-21 NOTE — ED Notes (Addendum)
 Hospitalist at bedside

## 2023-07-21 NOTE — Transitions of Care (Post Inpatient/ED Visit) (Signed)
   07/21/2023  Name: TISHINA LOWN MRN: [id number] DOB: August 18, 1955  Today's TOC FU Call Status: Today's TOC FU Call Status:: Unsuccessful Call (3rd Attempt) Unsuccessful Call (3rd Attempt) Date: 07/21/23  Attempted to reach the patient regarding the most recent Inpatient/ED visit.  Follow Up Plan: No further outreach attempts will be made at this time. We have been unable to contact the patient.  Channing Larry, RN, BA, [facility], CRRN [facility] Coordinator, Transition of Care Ph # [phone]

## 2023-07-21 NOTE — H&P (Signed)
 Date: 07/21/2023               Patient Name:  [name] [name] MRN: [id number]  DOB: Aug 06, 1955 Age / Sex: 68 y.o., female   PCP: Vicci Barnie [name], MD         Medical Service: Internal Medicine Teaching Service         Attending Physician: Dr. Rosan Dayton BROCKS, DO      First Contact: Dr. Ozell Riff, MD Pager [phone]    Second Contact: Dr. Ozell Kung, MD Pager [phone]         After Hours (After 5p/  First Contact Pager: [phone]  weekends / holidays): Second Contact Pager: [phone]   SUBJECTIVE   Chief Complaint: [name] [name] is a 68 y.o. female with a pertinent PMH of COPD, hypertension, prediabetes, CAD s/p stenting, and anxiety who was recently admitted for COPD exacerbation and suspected bacterial sinusitis and discharged on 07/15/2023. They presented to the ED on 07/20/2022 with anxiety, cough, and shortness of breath and are admitted for COPD exacerbation.   History of Present Illness: Patient discharged home on 07/15/2023 with 10 day steroid taper and 7 day course of Augmentin . Patient does not believe she got the properly supply of medications for her steroid taper, but we were able to speak with the patients daughter who manages her medication. They were able to confirm that she did complete her Augmentin  prescription and has 3  pills of her prednisone  taper left. Over the last two days she had multiple episodes of shortness of breath and felt that she was suffocating. She was scared she was dying, and called EMS.  She endorses hot flashes, nausea, chest tightness, anxiety, and shortness of breath, but denies chest pain, fevers, chills, vomiting, constipation, diarrhea, dysuria, frequency or urgency.   Meds: Albuterol  inhaler PRN Alendronate  35 mg q. Weekly- need to take this Wednesday  Amlodipine  5 mg daily- Did take it today  Augmentin  875-125 mg tablet every 12 hours- Finished  Aspirin  81 mg daily Atorvastatin  80 mg daily- need tonight  Brilinta  60 mg twice  daily BuSpar  30 mg a.m. and 15 mg noon, 15 mg evening- takes all 4 at night  Klonopin : 0.5mg  Half pill 2x a day  Doxepin  75 mg nightly- needs tonight  Lexapro  20 mg daily Ezetimibe  10 mg daily- needs tonight  Lasix  20 mg daily Gabapentin  600 mg BID Levothyroxine  112 mcg daily Losartan  75 mg daily Metformin  500 mg once daily  Mirtazapine  45 mg nightly- Needs tonight  Singulair  10 mg nightly Nebivolol  10 mg daily- Needs tonight  Nitrostat  Omeprazole  40 mg twice daily Prednisone  taper- Has been taking it, 3 pills left 10 mg once a day Chantix - took this morning, Needs patches  Trelegy Ellipta  1 puff daily Triamcinolone  cream  ED Course: Vitals: afebrile, tachypneic, 90-98 % on 2L.Lab work demonstrated leukocytosis to 17.2, thrombocytosis to 555.  CMP with glucose elevation to 138, hypoalbuminemia to 3.2.  BNP elevated at 182.  Chest x-ray with no active disease.  In ED given albuterol  nebulizer, 100 mg Doxy, DuoNeb nebulizer, Ativan  0.5 mg IV x 2.  Meds:  Current Meds  Medication Sig   albuterol  (VENTOLIN  HFA) 108 (90 Base) MCG/ACT inhaler INHALE 2 PUFFS INTO THE LUNGS EVERY 6 HOURS AS NEEDED FOR WHEEZING OR SHORTNESS OF BREATH   alendronate  (FOSAMAX ) 35 MG tablet Take 1 tablet (35 mg total) by mouth every 7 (seven) days. Take with a full glass of water on an empty stomach. Sit  upright for 30-60 mins after taking (Patient taking differently: Take 35 mg by mouth every Wednesday.)   amLODipine  (NORVASC ) 5 MG tablet Take 1 tablet (5 mg total) by mouth daily. (Patient taking differently: Take 5 mg by mouth in the morning.)   aspirin  EC 81 MG tablet Take 81 mg by mouth in the morning.   atorvastatin  (LIPITOR ) 80 MG tablet Take 1 tablet (80 mg total) by mouth daily, please schedule appointment for more refills   busPIRone  (BUSPAR ) 15 MG tablet Take 2 tablets (30 mg total) by mouth in the morning AND 1 tablet (15 mg total) daily at 12 noon AND 1 tablet (15 mg total) every evening. (Patient  taking differently: Take 2 tablets (30 mg total) by mouth in the morning AND 1 tablet (15 mg total) daily at 12 noon AND 1 tablet (15 mg total) every evening. (Patient takes 4 tabs every night at bedtime))   clonazePAM  (KLONOPIN ) 0.5 MG tablet Take 0.5 tablets (0.25 mg total) by mouth 2 (two) times daily as needed for anxiety.   doxepin  (SINEQUAN ) 75 MG capsule Take 1 capsule (75 mg total) by mouth at bedtime.   escitalopram  (LEXAPRO ) 20 MG tablet Take 1 tablet (20 mg total) by mouth daily. (Patient taking differently: Take 20 mg by mouth in the morning.)   ezetimibe  (ZETIA ) 10 MG tablet Take 1 tablet (10 mg total) by mouth daily. (Patient taking differently: Take 10 mg by mouth at bedtime.)   Fluticasone -Umeclidin-Vilant (TRELEGY ELLIPTA ) 100-62.5-25 MCG/ACT AEPB Inhale 1 puff into the lungs daily.   furosemide  (LASIX ) 20 MG tablet Take 1 tablet (20 mg total) by mouth daily. (Patient taking differently: Take 20 mg by mouth in the morning.)   gabapentin  (NEURONTIN ) 600 MG tablet TAKE 1 TABLET BY MOUTH 4 TIMES DAILY (Patient taking differently: Take 600 mg by mouth 2 (two) times daily.)   ipratropium-albuterol  (DUONEB) 0.5-2.5 (3) MG/3ML SOLN Take 3 mLs by nebulization every 6 (six) hours as needed.   levothyroxine  (SYNTHROID ) 112 MCG tablet Take 1 tablet (112 mcg total) by mouth daily. (Patient taking differently: Take 112 mcg by mouth in the morning.)   losartan  (COZAAR ) 50 MG tablet Take 1.5 tablets (75 mg total) by mouth daily. (Patient taking differently: Take 75 mg by mouth in the morning.)   metFORMIN  (GLUCOPHAGE ) 500 MG tablet Take 1 tablet (500 mg total) by mouth 2 (two) times daily with a meal. (Patient taking differently: Take 500 mg by mouth in the morning.)   mirtazapine  (REMERON ) 45 MG tablet Take 1 tablet (45 mg total) by mouth at bedtime.   mometasone -formoterol  (DULERA ) 100-5 MCG/ACT AERO Inhale 2 puffs into the lungs daily as needed for wheezing or shortness of breath.   montelukast   (SINGULAIR ) 10 MG tablet Take 1 tablet (10 mg total) by mouth at bedtime.   nebivolol  (BYSTOLIC ) 10 MG tablet Take 1 tablet (10 mg total) by mouth daily. (Patient taking differently: Take 10 mg by mouth at bedtime.)   omeprazole  (PRILOSEC) 40 MG capsule Take 1 capsule (40 mg total) by mouth 2 (two) times daily before a meal.   predniSONE  (DELTASONE ) 20 MG tablet Take 2 tablets (40 mg total) by mouth daily with breakfast for 1 day, THEN 1.5 tablets (30 mg total) daily with breakfast for 3 days, THEN 1 tablet (20 mg total) daily with breakfast for 3 days, THEN 0.5 tablets (10 mg total) daily with breakfast for 3 days.   ticagrelor  (BRILINTA ) 60 MG TABS tablet Take 1 tablet (60 mg  total) by mouth 2 (two) times daily.   varenicline  (CHANTIX ) 0.5 MG tablet Initial, 0.5 mg orally once daily for 3 days, then 0.5 mg twice daily on days 4 through 7. Then start taking the 1 mg dose twice daily for a total of 12 weeks. (Patient taking differently: Take 0.5 mg by mouth See admin instructions. Initial, 0.5 mg orally once daily for 3 days, then 0.5 mg twice daily on days 4 through 7. Then start taking the 1 mg dose twice daily for a total of 12 weeks.)    Past Medical History  Past Surgical History:  Procedure Laterality Date   ABDOMINAL EXPLORATION SURGERY  1977   ABDOMINAL HYSTERECTOMY  1977   left one of my ovaries   BACK SURGERY     BIOPSY  05/27/2019   Procedure: BIOPSY;  Surgeon: Shaaron Lamar HERO, MD;  Location: [facility];  Service: Endoscopy;;   BREAST BIOPSY Left 11/2021   x2   COLONOSCOPY  2011   Dr. Teressa: Mild diverticulosis, descending diminutive colon polyp (not retrieved), next colonoscopy 10 years   CORONARY ANGIOPLASTY WITH STENT PLACEMENT  2011 X2   regular stents didn't work; had to go back in in ~ 1 month and put in medicated stents   DILATION AND CURETTAGE OF UTERUS     ESOPHAGOGASTRODUODENOSCOPY     ESOPHAGOGASTRODUODENOSCOPY (EGD) WITH PROPOFOL  N/A 05/27/2019   normal  esophagus, dilation, erosive gastropathy s/p biopsy, normal duodenum. Negative H.pylori.    LEFT HEART CATH AND CORONARY ANGIOGRAPHY N/A 03/26/2017   Procedure: LEFT HEART CATH AND CORONARY ANGIOGRAPHY;  Surgeon: Claudene Victory ORN, MD;  Location: [facility];  Service: Cardiovascular;  Laterality: N/A;   LUMBAR LAMINECTOMY/DECOMPRESSION MICRODISCECTOMY  08/05/2011   Procedure: LUMBAR LAMINECTOMY/DECOMPRESSION MICRODISCECTOMY;  Surgeon: Lynwood JONELLE Mill, MD;  Location: [facility];  Service: Neurosurgery;  Laterality: Right;  Right Lumbar four-five extraforaminal discectomy   MALONEY DILATION N/A 05/27/2019   Procedure: AGAPITO DILATION;  Surgeon: Shaaron Lamar HERO, MD;  Location: [facility];  Service: Endoscopy;  Laterality: N/A;  54   SHOULDER ARTHROSCOPY WITH ROTATOR CUFF REPAIR AND OPEN BICEPS TENODESIS Right 12/24/2021   Procedure: RIGHT SHOULDER ARTHROSCOPY WITH ROTATOR CUFF REPAIR AND BICEPS TENODESIS;  Surgeon: Barbarann Oneil BROCKS, MD;  Location: [facility];  Service: Orthopedics;  Laterality: Right;   TONSILLECTOMY     as a child   UPPER GASTROINTESTINAL ENDOSCOPY      Social:  Lives With: Home alone Support: Daughter Level of Function: Able to complete ADLs, needs help with IADLS PCP: Barnie Louder MD Substances: No longer smoking - daughter dose not buy her cigarettes anymore. Drinks rarely, no other substances.    Allergies: Allergies as of 07/21/2023 - Review Complete 07/21/2023  Allergen Reaction Noted   Avocado Anaphylaxis 09/15/2017   Latex Shortness Of Breath and Rash 12/22/2008   Codeine  Nausea Only 12/22/2008    Review of Systems: A complete ROS was negative except as per HPI.   OBJECTIVE:   Physical Exam: Blood pressure (!) 113/91, pulse 86, temperature 98.9 F (37.2 C), temperature source Oral, resp. rate (!) 22, height 4' 9 (1.448 m), weight 66.7 kg, SpO2 93%.  Constitutional: mildly uncomfortable, in no acute distress Cardiovascular: regular rate and rhythm,  no m/r/g Pulmonary/Chest: increased work of breathing on room air, diffuse expiratory wheezing bilaterally.  Abdominal: soft, non-tender, non-distended Skin: warm and dry Extremities: No LE edema appreciated Psych: Pleasant affect, linear speech  Labs: CBC    Component Value  Date/Time   WBC 17.2 (H) 07/21/2023 1755   RBC 4.09 07/21/2023 1755   HGB 13.2 07/21/2023 1755   HGB 14.8 05/20/2023 1119   HCT 39.9 07/21/2023 1755   HCT 44.2 05/20/2023 1119   PLT 555 (H) 07/21/2023 1755   PLT 373 05/20/2023 1119   MCV 97.6 07/21/2023 1755   MCV 99 (H) 05/20/2023 1119   MCH 32.3 07/21/2023 1755   MCHC 33.1 07/21/2023 1755   RDW 14.3 07/21/2023 1755   RDW 13.2 05/20/2023 1119   LYMPHSABS 0.8 07/13/2023 0513   LYMPHSABS 2.6 04/05/2019 0926   MONOABS 0.9 07/13/2023 0513   EOSABS 0.0 07/13/2023 0513   EOSABS 0.3 04/05/2019 0926   BASOSABS 0.0 07/13/2023 0513   BASOSABS 0.1 04/05/2019 0926     CMP     Component Value Date/Time   NA 139 07/21/2023 1755   NA 142 06/26/2023 1339   K 3.9 07/21/2023 1755   CL 101 07/21/2023 1755   CO2 26 07/21/2023 1755   GLUCOSE 138 (H) 07/21/2023 1755   BUN 6 (L) 07/21/2023 1755   BUN 14 06/26/2023 1339   CREATININE 0.64 07/21/2023 1755   CREATININE 0.80 03/24/2023 1232   CREATININE 0.93 04/16/2016 0937   CALCIUM  9.1 07/21/2023 1755   PROT 6.5 07/21/2023 1755   PROT 6.2 06/26/2023 1339   ALBUMIN 3.2 (L) 07/21/2023 1755   ALBUMIN 4.0 06/26/2023 1339   AST 18 07/21/2023 1755   AST 19 03/24/2023 1232   ALT 19 07/21/2023 1755   ALT 23 03/24/2023 1232   ALKPHOS 87 07/21/2023 1755   BILITOT 0.6 07/21/2023 1755   BILITOT 0.4 06/26/2023 1339   BILITOT 0.4 03/24/2023 1232   GFRNONAA >60 07/21/2023 1755   GFRNONAA >60 03/24/2023 1232   GFRNONAA 67 04/16/2016 0937   GFRAA 77 04/05/2019 0926   GFRAA 78 04/16/2016 0937    Imaging: CXR w/o acute findings.   EKG: personally reviewed my interpretation is no acute findings. Prior EKG  similar.  ASSESSMENT & PLAN:   Assessment & Plan by Problem: Principal Problem:   Acute exacerbation of chronic obstructive pulmonary disease (COPD) (HCC) Active Problems:   Hypothyroidism   Essential hypertension, benign   Dyslipidemia   Anxiety and depression   COPD (chronic obstructive pulmonary disease) (HCC)GOLD E   Leukocytosis   [name] [name] is a 67 y.o. female with a pertinent PMH of COPD, hypertension, prediabetes, CAD s/p stenting, and anxiety who was recently admitted for COPD exacerbation and suspected bacterial sinusitis and discharged on 07/15/2023. They presented to the ED on 07/20/2022 with anxiety, cough, and shortness of breath and are admitted for COPD exacerbation. on hospital day 0  Acute COPD exacerbation Unclear etiology of COPD exacerbation at this time.  She does have wheezing on exam as well as increased work of breathing, but is maintaining her oxygen  saturation relatively well.  We turned off her oxygen  in the room, and she was able to maintain a saturation of 95% on room air.  I imagine there is a component of anxiety to her presentation, for which she takes multiple medications. Plan: - Prednisone  40 mg daily x 5 day - Duonebs q6h - Breo and Incruse in place of home trilogy daily - Singulair  10mg  qhs - Supplemenetal O2 as needed. Goal 88-92%   Tobacco use disorder Recently quit (3 days ago). Would benefit from resources / medications for continued smoking cessation.  Plan:  - Nicotine  patches   Leukocytosis Likely 2-2 steroid taper.  Plan: -  Daily CBC  Pulmonary Nodules: Per Radiology - CT chest on 02/07/2023 with  Lung-RADS 3, probably benign findings. Short-term follow-up in 6 months is recommended with repeat low-dose chest CT without contrast (please use the following order, CT CHEST LCS NODULE FOLLOW-UP W/O CM). Three new scattered solid pulmonary nodules, largest 5.0 mm in the posterior left upper lobe.  Chronic conditions: HFpEF:  Continued home lasix  20 mg, nebivolol  10 mg daily.  CAD s/p stenting: Continued home ticagrelor  60 mg BID, ASA 81 mg daily. Lipitor  80mg  daily. Zetia  10mg  daily. HTN: amlodipine  5 mg daily, losartan  75 mg daily. Prediabetes: holding metformin  500 mg Anxiety/Depression: Buspar  60mg  at bedtime, Klonipin 0.25mg  BID, Doxepin  75mg  daily, Remeron  45mg  at bedtime,  Hypothyroidism: TSH one month ago wnl. Continued home synthroid  112 mcg daily.    Diet: Heart Healthy VTE: SCDs IVF: None,None Code: Full  Prior to Admission Living Arrangement: Home, living alone Anticipated Discharge Location: Home Barriers to Discharge: Resolution of symptoms   Dispo: Admit patient to Observation with expected length of stay less than 2 midnights.  Signed: Gabino Boga, MD Internal Medicine Resident PGY-1  07/21/2023, 10:07 PM

## 2023-07-21 NOTE — Hospital Course (Addendum)
#  COPD exacerbation  Patient is admitted for another COPD exacerbation, she was discharged one week ago.  Patient tested negative for influenza, RSV, COVID.  She did test positive for rhinovirus.  Originally we were suspicious that her reduced klonopin  dose was driving/exacerbating her acute respiratory symptoms.  It appears that her rhinovirus was possibly the cause of her COPD exacerbation.  Initial physical exam was remarkable for diffuse expiratory wheezes that improved on the day of discharge.  We treated her COPD exacerbation with prednisone  40 mg for 2 days while inpatient, she was provided a 3-day outpatient course.  Patient was also provided Brovana , Pulmicort , Yuperli, duo nebulizer therapy while hospitalized.  She was also provided a spacer to use with her inhaler and educated on proper usage.  She was discharged home on 2 L of supplemental oxygen  nasal cannula. Patient's viral panel came back after she was discharged, I was able to contact her emergency contact (who will notify the patient) and go over the test results that patient is positive for rhinovirus.  I was unable to contact the patient directly after attempting multiple phone calls.  #Tobacco use disorder  Patient is on Chantix  at home which is helping her tobacco cessation.  Patient was provided nicotine  patches while hospitalized and encouraged to use Chantix  to stop smoking.  Patient was provided education on smoking cessation.  #Anxiety/Depression Home regimen of Buspar  60mg  at bedtime, Klonipin 0.25mg  BID, Doxepin  75mg  daily, Remeron  45mg  at bedtime. Patient was recently seen by psychiatry a month ago who decreased the klonopin  to 0.25mg  BID. Since then, she reports increase in prn needs and anxiety/panic symptoms.  We increased her Klonopin  to 0.5 mg twice a day and she reported that her anxiety was more controlled as well as her breathing.  She was discharged home on the increased dose of Klonopin  0.5 mg twice a day and instructed  to follow-up with her psychiatrist  #Leukocytosis  Most likely due to steroid use.   #Pulmonary Nodules: Per Radiology - CT chest on 02/07/2023 with  Lung-RADS 3, probably benign findings. Short-term follow-up in 6 months is recommended with repeat low-dose chest CT without contrast (please use the following order, CT CHEST LCS NODULE FOLLOW-UP W/O CM). Three new scattered solid pulmonary nodules, largest 5.0 mm in the posterior left upper lobe.    Chronic Conditions: HFpEF: Continued home lasix  20 mg, nebivolol  10 mg daily.  CAD s/p stenting: Continued home ticagrelor  60 mg BID, ASA 81 mg daily. Lipitor  80mg  daily. Zetia  10mg  daily. HTN: amlodipine  5 mg daily, losartan  75 mg daily. Prediabetes: holding metformin  500 mg Hypothyroidism: TSH one month ago wnl. Continued home synthroid  112 mcg daily.

## 2023-07-22 ENCOUNTER — Other Ambulatory Visit (HOSPITAL_COMMUNITY): Payer: Self-pay

## 2023-07-22 DIAGNOSIS — J441 Chronic obstructive pulmonary disease with (acute) exacerbation: Principal | ICD-10-CM

## 2023-07-22 DIAGNOSIS — F419 Anxiety disorder, unspecified: Secondary | ICD-10-CM | POA: Diagnosis not present

## 2023-07-22 DIAGNOSIS — I10 Essential (primary) hypertension: Secondary | ICD-10-CM

## 2023-07-22 DIAGNOSIS — F17210 Nicotine dependence, cigarettes, uncomplicated: Secondary | ICD-10-CM | POA: Diagnosis not present

## 2023-07-22 DIAGNOSIS — F32A Depression, unspecified: Secondary | ICD-10-CM | POA: Diagnosis not present

## 2023-07-22 DIAGNOSIS — J439 Emphysema, unspecified: Secondary | ICD-10-CM

## 2023-07-22 LAB — CBC
HCT: 39.3 % (ref 36.0–46.0)
Hemoglobin: 12.9 g/dL (ref 12.0–15.0)
MCH: 31.9 pg (ref 26.0–34.0)
MCHC: 32.8 g/dL (ref 30.0–36.0)
MCV: 97.3 fL (ref 80.0–100.0)
Platelets: 566 10*3/uL — ABNORMAL HIGH (ref 150–400)
RBC: 4.04 MIL/uL (ref 3.87–5.11)
RDW: 14.3 % (ref 11.5–15.5)
WBC: 16.3 10*3/uL — ABNORMAL HIGH (ref 4.0–10.5)
nRBC: 0 % (ref 0.0–0.2)

## 2023-07-22 LAB — BLOOD GAS, VENOUS
Acid-Base Excess: 0.1 mmol/L (ref 0.0–2.0)
Bicarbonate: 24.7 mmol/L (ref 20.0–28.0)
O2 Saturation: 99.1 %
Patient temperature: 37
pCO2, Ven: 39 mm[Hg] — ABNORMAL LOW (ref 44–60)
pH, Ven: 7.41 (ref 7.25–7.43)
pO2, Ven: 135 mm[Hg] — ABNORMAL HIGH (ref 32–45)

## 2023-07-22 LAB — COMPREHENSIVE METABOLIC PANEL
ALT: 19 U/L (ref 0–44)
AST: 18 U/L (ref 15–41)
Albumin: 3 g/dL — ABNORMAL LOW (ref 3.5–5.0)
Alkaline Phosphatase: 85 U/L (ref 38–126)
Anion gap: 13 (ref 5–15)
BUN: 15 mg/dL (ref 8–23)
CO2: 22 mmol/L (ref 22–32)
Calcium: 9.3 mg/dL (ref 8.9–10.3)
Chloride: 100 mmol/L (ref 98–111)
Creatinine, Ser: 0.84 mg/dL (ref 0.44–1.00)
GFR, Estimated: >60 mL/min (ref >60)
Glucose, Bld: 228 mg/dL — ABNORMAL HIGH (ref 70–99)
Potassium: 5 mmol/L (ref 3.5–5.1)
Sodium: 135 mmol/L (ref 135–145)
Total Bilirubin: 0.4 mg/dL (ref 0.0–1.2)
Total Protein: 6.5 g/dL (ref 6.5–8.1)

## 2023-07-22 MED ORDER — RIVAROXABAN 10 MG PO TABS
10.0000 mg | ORAL_TABLET | Freq: Every day | ORAL | Status: DC
  Administered 2023-07-22 – 2023-07-23 (×2): 10 mg via ORAL
  Filled 2023-07-22 (×2): qty 1

## 2023-07-22 MED ORDER — CLONAZEPAM 0.25 MG PO TBDP
0.5000 mg | ORAL_TABLET | Freq: Two times a day (BID) | ORAL | Status: DC
  Administered 2023-07-22 – 2023-07-23 (×2): 0.5 mg via ORAL
  Filled 2023-07-22 (×2): qty 2

## 2023-07-22 MED ORDER — AEROCHAMBER PLUS FLO-VU MEDIUM MISC
1.0000 | Freq: Once | Status: AC
  Administered 2023-07-22: 1
  Filled 2023-07-22: qty 1

## 2023-07-22 MED ORDER — BUDESONIDE 0.25 MG/2ML IN SUSP
0.2500 mg | Freq: Two times a day (BID) | RESPIRATORY_TRACT | Status: DC
  Administered 2023-07-22 – 2023-07-23 (×2): 0.25 mg via RESPIRATORY_TRACT
  Filled 2023-07-22 (×2): qty 2

## 2023-07-22 MED ORDER — SPACER/AERO-HOLDING CHAMBERS DEVI
0 refills | Status: DC
  Filled 2023-07-22: qty 1, fill #0
  Filled 2023-08-07: qty 1, 30d supply, fill #0

## 2023-07-22 MED ORDER — REVEFENACIN 175 MCG/3ML IN SOLN
175.0000 ug | Freq: Every day | RESPIRATORY_TRACT | Status: DC
  Administered 2023-07-22 – 2023-07-23 (×2): 175 ug via RESPIRATORY_TRACT
  Filled 2023-07-22 (×2): qty 3

## 2023-07-22 MED ORDER — ARFORMOTEROL TARTRATE 15 MCG/2ML IN NEBU
15.0000 ug | INHALATION_SOLUTION | Freq: Two times a day (BID) | RESPIRATORY_TRACT | Status: DC
  Administered 2023-07-22 – 2023-07-23 (×2): 15 ug via RESPIRATORY_TRACT
  Filled 2023-07-22 (×2): qty 2

## 2023-07-22 MED ORDER — IPRATROPIUM-ALBUTEROL 0.5-2.5 (3) MG/3ML IN SOLN: 3.0000 mL | RESPIRATORY_TRACT | Status: DC

## 2023-07-22 MED ORDER — IPRATROPIUM-ALBUTEROL 0.5-2.5 (3) MG/3ML IN SOLN
3.0000 mL | RESPIRATORY_TRACT | Status: DC
  Administered 2023-07-22 – 2023-07-23 (×4): 3 mL via RESPIRATORY_TRACT
  Filled 2023-07-22 (×4): qty 3

## 2023-07-22 NOTE — Care Management Obs Status (Signed)
 MEDICARE OBSERVATION STATUS NOTIFICATION   Patient Details  Name: [name] [name] MRN: [id number] Date of Birth: 08/08/1955  Explained letter to patient.  Medicare Observation Status Notification Given:  Yes    Stephane Powell Jansky, RN 07/22/2023, 1:34 PM

## 2023-07-22 NOTE — TOC Initial Note (Signed)
 Transition of Care (TOC) - Initial/Assessment Note   Spoke to patient at bedside. Patient from home alone, daughter close by. Active with [facility] for [facility] and HHPT and would like to continue services. Cory with [facility] aware. Entered orders.   Patient has oxygen  with [employer] and has portable tank , walker, bedside commode.  Patient Details  Name: [name] [name] MRN: [id number] Date of Birth: 02/10/1956  Transition of [facility]) CM/SW Contact:    Stephane Powell Jansky, RN Phone Number: 07/22/2023, 1:37 PM  Clinical Narrative:                   Expected Discharge Plan: Home w Home Health Services Barriers to Discharge: Continued Medical Work up   Patient Goals and CMS Choice Patient states their goals for this hospitalization and ongoing recovery are:: to return to home CMS Medicare.gov Compare Post Acute Care list provided to:: Patient Choice offered to / list presented to : Patient [place] ownership interest in [facility].provided to:: Patient    Expected Discharge Plan and Services   Discharge Planning Services: CM Consult Post Acute Care Choice: Home Health Living arrangements for the past 2 months: Single Family Home                 DME Arranged: N/A DME Agency: NA       HH Arranged: RN, PT HH Agency: [facility] Date [facility] Agency Contacted: 07/22/23 Time HH Agency Contacted: [id number] Representative spoke with at [facility] Agency: Darleene  Prior Living Arrangements/Services Living arrangements for the past 2 months: Single Family Home Lives with:: Self Patient language and need for interpreter reviewed:: Yes Do you feel safe going back to the place where you live?: Yes      Need for Family Participation in Patient Care: Yes (Comment) Care giver support system in place?: Yes (comment) Current home services: DME, Home PT, Home RN Criminal Activity/Legal Involvement Pertinent to Current Situation/Hospitalization: No - Comment as needed  Activities of Daily  Living   ADL Screening (condition at time of admission) Independently performs ADLs?: Yes (appropriate for developmental age) Is the patient deaf or have difficulty hearing?: No Does the patient have difficulty seeing, even when wearing glasses/contacts?: Yes Does the patient have difficulty concentrating, remembering, or making decisions?: No  Permission Sought/Granted   Permission granted to share information with : Yes, Verbal Permission Granted     Permission granted to share info w AGENCY: [facility]        Emotional Assessment Appearance:: Appears stated age Attitude/Demeanor/Rapport: Engaged Affect (typically observed): Accepting Orientation: : Oriented to Self, Oriented to Place, Oriented to  Time, Oriented to Situation Alcohol / Substance Use: Not Applicable Psych Involvement: No (comment)  Admission diagnosis:  Acute exacerbation of chronic obstructive pulmonary disease (COPD) (HCC) [J44.1] COPD (chronic obstructive pulmonary disease) (HCC) [J44.9] Patient Active Problem List   Diagnosis Date Noted   Leukocytosis 07/21/2023   Infection due to parainfluenza virus 4 06/20/2023   Full code status 06/14/2023   Acute hypoxic respiratory failure (HCC) 06/13/2023   Acute exacerbation of chronic obstructive pulmonary disease (COPD) (HCC) 06/12/2023   Nicotine  dependence 05/03/2023   Left ankle sprain 04/17/2023   Substance abuse (HCC) 09/10/2022   Nontraumatic complete tear of right rotator cuff    Superior glenoid labrum lesion of left shoulder    Iron  deficiency anemia 11/02/2021   Major depressive disorder, recurrent, in full remission with anxious distress (HCC) 05/31/2021   Rotator cuff tendinitis, right 05/08/2021  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   Chronic neck pain 02/26/2021   GAD (generalized anxiety disorder) 01/08/2021   Acute cystitis without hematuria 11/07/2020   Influenza vaccine needed 06/15/2020   Abdominal pain, epigastric 04/28/2019    Esophageal dysphagia 04/28/2019   Bipolar I disorder (HCC) 09/17/2018   Insomnia 10/28/2017   Prediabetes 05/28/2017   QT prolongation 03/25/2017   Encounter for screening mammogram for breast cancer 09/11/2015   Psoriasis 06/22/2015   COPD (chronic obstructive pulmonary disease) (HCC)GOLD E 05/18/2015   Anxiety and depression 07/10/2013   Essential hypertension, benign 06/07/2013   Dyslipidemia 06/07/2013   Asthma, chronic 06/07/2013   Emphysema lung (HCC) 06/07/2013   Chronic back pain 06/07/2013   CAD S/P percutaneous coronary angioplasty 06/07/2013   GERD (gastroesophageal reflux disease) 06/07/2013   Breast lump on left side at 3 o'clock position 10/13/2012   S/P abdominal hysterectomy and right salpingo-oophorectomy 08/29/2011   COPD exacerbation (HCC) 09/15/2009   TOBACCO ABUSE 07/17/2009   Chronic rhinitis 07/17/2009   Lung nodule < 6cm on CT 07/17/2009   ALLERGY, FOOD 07/17/2009   Hypothyroidism 12/22/2008   PCP:  Vicci Barnie NOVAK, MD Pharmacy:   [facility] - [facility] [address] [place] [address] Phone: [phone] Fax: [phone]  [facility] [address], [place] - [address] [place] [address] Phone: [phone] Fax: [phone]  [facility] [place] [facility] [address] [place] [address] Phone: [phone] Fax: [phone]  [facility] [address] [place] [address] Phone: [phone] Fax: [phone]  [facility] - [place], [place] - [address] [place] [address] Phone: [phone] Fax: [phone]     Social Drivers of Health (SDOH) Social History: SDOH Screenings   Food Insecurity: No Food Insecurity (07/22/2023)  Housing: Low Risk  (07/22/2023)  Transportation Needs: No Transportation Needs (07/22/2023)  Utilities: Not At  Risk (07/22/2023)  Alcohol Screen: Low Risk  (12/24/2022)  Depression (PHQ2-9): Low Risk  (06/27/2023)  Recent Concern: Depression (PHQ2-9) - Medium Risk (04/25/2023)  Financial Resource Strain: Low Risk  (12/24/2022)  Physical Activity: Inactive (12/24/2022)  Social Connections: Moderately Isolated (07/22/2023)  Stress: No Stress Concern Present (12/24/2022)  Tobacco Use: High Risk (07/21/2023)   SDOH Interventions:     Readmission Risk Interventions    06/20/2023    3:17 PM  Readmission Risk Prevention Plan  Transportation Screening Complete  HRI or Home Care Consult Complete  Social Work Consult for Recovery Care Planning/Counseling Complete  Palliative Care Screening Not Applicable  Medication Review Oceanographer) Complete

## 2023-07-22 NOTE — Progress Notes (Signed)
 Subjective:  [name] [name] is a 68 y.o. female with a pertinent PMH of COPD, hypertension, prediabetes, CAD s/p stenting, and anxiety who was recently admitted for COPD exacerbation and suspected bacterial sinusitis and discharged on 07/15/2023. They presented to the ED on 07/20/2022 with anxiety, cough, and shortness of breath and are admitted for COPD exacerbation.   Today, patient reports feeling short of breath. She stated that her shortness of breath started after she forgot to use her oxygen  when she walked away from the restroom at home. She felt SOB and began to panic because she was too tired to make it to her oxygen  tank at home so that's when she called EMS. She also reports a nonproductive cough. She reports that since her klonopin  dose was decreased she has required the klonopin  on more of a regular basis and that the reduced dose is not helping her anxiety.    Objective:  Vital signs in last 24 hours: Vitals:   07/22/23 0329 07/22/23 0609 07/22/23 0815 07/22/23 1057  BP: (!) 157/72 (!) 157/72 (!) 145/79   Pulse: 81  89 85  Resp: 18  20 18   Temp: 98.2 F (36.8 C)  98.2 F (36.8 C)   TempSrc: Oral  Oral   SpO2: 93%  93% 93%  Weight:      Height:       Physical Exam: General:Chronically ill appearing female Cardiac:RRR Pulmonary:Diffuse expiratory wheezing present, on 2L [place] Abdominal:soft Neuro:awake, alert, participating in conversation  MSK:No pitting edema present  Skin:warm and dry Psych: Anxious mood, normal affect      Latest Ref Rng & Units 07/22/2023    6:48 AM 07/21/2023    5:55 PM 07/13/2023    5:13 AM  CBC  WBC 4.0 - 10.5 K/uL 16.3  17.2  8.7   Hemoglobin 12.0 - 15.0 g/dL 87.0  86.7  87.5   Hematocrit 36.0 - 46.0 % 39.3  39.9  38.1   Platelets 150 - 400 K/uL 566  555  396        Latest Ref Rng & Units 07/22/2023    6:48 AM 07/21/2023    5:55 PM 07/13/2023    5:13 AM  BMP  Glucose 70 - 99 mg/dL 771  861  858   BUN 8 - 23 mg/dL 15  6  20    Creatinine  0.44 - 1.00 mg/dL 9.15  9.35  9.26   Sodium 135 - 145 mmol/L 135  139  137   Potassium 3.5 - 5.1 mmol/L 5.0  3.9  4.6   Chloride 98 - 111 mmol/L 100  101  102   CO2 22 - 32 mmol/L 22  26  27    Calcium  8.9 - 10.3 mg/dL 9.3  9.1  9.3      Assessment/Plan:  Principal Problem:   Acute exacerbation of chronic obstructive pulmonary disease (COPD) (HCC) Active Problems:   Hypothyroidism   Essential hypertension, benign   Dyslipidemia   Anxiety and depression   COPD (chronic obstructive pulmonary disease) (HCC)GOLD E   Leukocytosis  #COPD exacerbation  Patient is admitted for another COPD exacerbation, she was discharged one week ago. Suspicious that her reduced klonopin  dose is driving/exacerbating her acute respiratory symptoms. Physical exam is remarkable for diffuse expiratory wheezes. Will continue to treat her COPD exacerbation and increase klonopin . Plan: -Prednisone  40mg  for 5 days, day 2/5 -Increase nebulizer treatment:  -Brovana  BID, pumicort BID, yupelri  daily, dueonebs q4 hours  -Will order a spacer for her  inhalers at home   #Tobacco use disorder  Patient is on Chantix  at home which is helping her tobacco cessation.  -Nicotine  patches -Will resume at discharge   #Anxiety/Depression Home regimen of Buspar  60mg  at bedtime, Klonipin 0.25mg  BID, Doxepin  75mg  daily, Remeron  45mg  at bedtime. Patient was recently seen by psychiatry a month ago who decreased the klonopin  to 0.25mg  BID. Since then, she reports increase in prn needs and anxiety/panic symptoms.  -Will increase to Klonopin  0.5 mg BID due to COPD driving anxiety during hospitalizations.     #Leukocytosis  Improved from 17.2 to 16.3, most likely due to steroid use. -CBC in the morning   #Pulmonary Nodules: Per Radiology - CT chest on 02/07/2023 with  Lung-RADS 3, probably benign findings. Short-term follow-up in 6 months is recommended with repeat low-dose chest CT without contrast (please use the following order, CT  CHEST LCS NODULE FOLLOW-UP W/O CM). Three new scattered solid pulmonary nodules, largest 5.0 mm in the posterior left upper lobe.   Chronic Conditions: HFpEF: Continued home lasix  20 mg, nebivolol  10 mg daily.  CAD s/p stenting: Continued home ticagrelor  60 mg BID, ASA 81 mg daily. Lipitor  80mg  daily. Zetia  10mg  daily. HTN: amlodipine  5 mg daily, losartan  75 mg daily. Prediabetes: holding metformin  500 mg Hypothyroidism: TSH one month ago wnl. Continued home synthroid  112 mcg daily.     Resolved Problems:   __________________________________ Prior to Admission Living Arrangement: Home Anticipated Discharge Location:Home Barriers to Discharge:Medical treatment  Dispo: Anticipated discharge in approximately 1 day(s).   Kandis Perkins, DO 07/22/2023, 11:29 AM Pager: [phone] After 5pm on weekdays and 1pm on weekends: On Call pager [phone]

## 2023-07-22 NOTE — Evaluation (Signed)
 Physical Therapy Evaluation Patient Details Name: [name] [name] MRN: [id number] DOB: 07/23/1955 Today's Date: 07/22/2023  History of Present Illness  Pt presented to ED with anxiety, cough, and shortness of breath and are admitted for COPD exacerbation. PMH includes: COPD, hypertension, prediabetes, CAD s/p stenting, and anxiety who was recently admitted for COPD exacerbation and suspected bacterial sinusitis and discharged on 07/15/2023.  Clinical Impression  PTA pt living alone in single story apartment with 2 steps to enter. Pt was working with HHPT and HHOT in progressing her mobility with use of supplemental oxygen . Pt ambulating with RW in her home and able to perform ADLs, daughter assist with ADLs and iADLs. Pt is currently limited in safe mobility by high anxiety about not being able to catch her breath with ambulating, and poor understanding of COPD disease process. Worked during session on purse lipped breathing, proper use of supplemental oxygen  (not taking it off to quickly run to the bathroom), energy conservation and safe ambulation with RW.         If plan is discharge home, recommend the following: A little help with walking and/or transfers;A little help with bathing/dressing/bathroom;Assistance with cooking/housework;Assist for transportation;Help with stairs or ramp for entrance   Can travel by private vehicle    Yes    Equipment Recommendations None recommended by PT     Functional Status Assessment Patient has had a recent decline in their functional status and demonstrates the ability to make significant improvements in function in a reasonable and predictable amount of time.     Precautions / Restrictions Precautions Precautions: Fall Restrictions Weight Bearing Restrictions Per Provider Order: No      Mobility  Bed Mobility Overal bed mobility: Independent             General bed mobility comments: up in recliner on entry    Transfers Overall  transfer level: Needs assistance Equipment used: Rolling walker (2 wheels), None Transfers: Sit to/from Stand Sit to Stand: Supervision           General transfer comment: STS from recliner to RW, without assist, cuing for standing breathing and making sure that she is not dizzy or light headed    Ambulation/Gait Ambulation/Gait assistance: Contact guard assist Gait Distance (Feet): 100 Feet Assistive device: Rolling walker (2 wheels) Gait Pattern/deviations: Decreased stride length Gait velocity: decreased Gait velocity interpretation: <1.8 ft/sec, indicate of risk for recurrent falls   General Gait Details: cuing for maintaining breathing pattern with pursed lip exhale, and no talking while walking, pt is very happy with length of ambulation and ability to control her breath throughout ambulation        Balance Overall balance assessment: Needs assistance Sitting-balance support: Feet supported Sitting balance-Leahy Scale: Good     Standing balance support: During functional activity, Bilateral upper extremity supported, Reliant on assistive device for balance Standing balance-Leahy Scale: Fair                               Pertinent Vitals/Pain  No denies pain     Home Living Family/patient expects to be discharged to:: Private residence Living Arrangements: Alone Available Help at Discharge: Family;Available PRN/intermittently Type of Home: Apartment Home Access: Stairs to enter Entrance Stairs-Rails: Right;Left Entrance Stairs-Number of Steps: 2   Home Layout: One level Home Equipment: Agricultural Consultant (2 wheels);Shower seat;Hand held shower head;Grab bars - tub/shower Additional Comments: daughter lives nearby    Prior Function Prior  Level of Function : Independent/Modified Independent             Mobility Comments: recently has begun using rw ADLs Comments: does not drive. Mod I with ADLs     Extremity/Trunk Assessment   Upper  Extremity Assessment Upper Extremity Assessment: Defer to OT evaluation    Lower Extremity Assessment Lower Extremity Assessment: Overall WFL for tasks assessed    Cervical / Trunk Assessment Cervical / Trunk Assessment: Normal  Communication   Communication Communication: No apparent difficulties  Cognition Arousal: Alert Behavior During Therapy: Anxious, Impulsive, WFL for tasks assessed/performed Overall Cognitive Status: Within Functional Limits for tasks assessed                                 General Comments: spent increased time on education on need for slowed movements and concentration on breathing instead of rushing to finish activities        General Comments General comments (skin integrity, edema, etc.): Pt on 2.5L O2 via [place] on entry, with SpO2 94%O2, increased to 3 L with switching to tank and able to maintain SpO2 > 90%O2 with ambulation and deliberate purse lip breathing and no talking with ambulation, being able to complete task without increased SoB very empowering, max noted HR with ambulation 115 bpm        Assessment/Plan    PT Assessment Patient needs continued PT services  PT Problem List Decreased strength;Decreased activity tolerance;Decreased mobility;Cardiopulmonary status limiting activity       PT Treatment Interventions DME instruction;Balance training;Modalities;Gait training;Neuromuscular re-education;Functional mobility training;Patient/family education;Therapeutic activities;Therapeutic exercise;Stair training    PT Goals (Current goals can be found in the Care Plan section)  Acute Rehab PT Goals Patient Stated Goal: get better PT Goal Formulation: With patient Time For Goal Achievement: 08/05/23 Potential to Achieve Goals: Good    Frequency Min 1X/week        AM-PAC PT 6 Clicks Mobility  Outcome Measure Help needed turning from your back to your side while in a flat bed without using bedrails?: None Help needed  moving from lying on your back to sitting on the side of a flat bed without using bedrails?: None Help needed moving to and from a bed to a chair (including a wheelchair)?: A Little Help needed standing up from a chair using your arms (e.g., wheelchair or bedside chair)?: A Little Help needed to walk in hospital room?: A Little Help needed climbing 3-5 steps with a railing? : A Lot 6 Click Score: 19    End of Session Equipment Utilized During Treatment: Oxygen ;Gait belt Activity Tolerance: Patient tolerated treatment well Patient left: in chair;with call bell/phone within reach;with chair alarm set Nurse Communication: Mobility status (need for assist with tank) PT Visit Diagnosis: Muscle weakness (generalized) (M62.81);Difficulty in walking, not elsewhere classified (R26.2)    Time: 8643-8579 PT Time Calculation (min) (ACUTE ONLY): 24 min   Charges:   PT Evaluation $PT Eval Low Complexity: 1 Low PT Treatments $Gait Training: 8-22 mins PT General Charges $$ ACUTE PT VISIT: 1 Visit         [place] Emmitt B. Fleeta Lapidus PT, DPT Acute Rehabilitation Services Please use secure chat or  Call Office [phone]   Almarie [name] Fleeta [facility] 07/22/2023, 5:27 PM

## 2023-07-22 NOTE — Progress Notes (Signed)
 BIPAP not indicated at this time, no machine in room.

## 2023-07-22 NOTE — Evaluation (Signed)
 Occupational Therapy Evaluation Patient Details Name: [name] [name] MRN: [id number] DOB: 01-Mar-1956 Today's Date: 07/22/2023   History of Present Illness Pt presented to ED with anxiety, cough, and shortness of breath and are admitted for COPD exacerbation. PMH includes: COPD, hypertension, prediabetes, CAD s/p stenting, and anxiety who was recently admitted for COPD exacerbation and suspected bacterial sinusitis and discharged on 07/15/2023.   Clinical Impression   Pt admitted with the above diagnoses and presents with below problem list. Pt will benefit from continued acute OT to address the below listed deficits and maximize independence with basic ADLs prior to d/c. At baseline, pt lives alone, mod I with ADLs; does not drive. Pt currently needs up to setup/supervision assist with ADLs. Educated pt on keeping [place] on at all times and calling for assist for bathroom trips in part to manage supplemental O2 needs.        If plan is discharge home, recommend the following: Assist for transportation;Assistance with cooking/housework    Functional Status Assessment  Patient has had a recent decline in their functional status and demonstrates the ability to make significant improvements in function in a reasonable and predictable amount of time.  Equipment Recommendations  None recommended by OT    Recommendations for Other Services PT consult     Precautions / Restrictions Precautions Precautions: Fall Restrictions Weight Bearing Restrictions Per Provider Order: No      Mobility Bed Mobility Overal bed mobility: Independent                  Transfers Overall transfer level: Needs assistance Equipment used: Rolling walker (2 wheels), None Transfers: Sit to/from Stand Sit to Stand: Supervision                  Balance Overall balance assessment: Needs assistance Sitting-balance support: Feet supported Sitting balance-Leahy Scale: Good     Standing balance  support: During functional activity, Bilateral upper extremity supported, Reliant on assistive device for balance Standing balance-Leahy Scale: Fair                             ADL either performed or assessed with clinical judgement   ADL Overall ADL's : Needs assistance/impaired Eating/Feeding: Independent   Grooming: Set up;Sitting   Upper Body Bathing: Set up;Sitting   Lower Body Bathing: Supervison/ safety;Sit to/from stand   Upper Body Dressing : Set up;Sitting   Lower Body Dressing: Supervision/safety;Sit to/from stand;Set up   Toilet Transfer: Supervision/safety;Ambulation;Rolling walker (2 wheels)   Toileting- Clothing Manipulation and Hygiene: Supervision/safety   Tub/ Shower Transfer: Supervision/safety   Functional mobility during ADLs: Supervision/safety;Rolling walker (2 wheels);Set up General ADL Comments: Education around calling for help before going to the bathroom. Pt continues to remove O2 line when she has an OOB task in mind she feels she needs to do.     Vision         Perception         Praxis         Pertinent Vitals/Pain Pain Assessment Pain Assessment: No/denies pain     Extremity/Trunk Assessment Upper Extremity Assessment Upper Extremity Assessment: Overall WFL for tasks assessed   Lower Extremity Assessment Lower Extremity Assessment: Defer to PT evaluation   Cervical / Trunk Assessment Cervical / Trunk Assessment: Normal   Communication Communication Communication: No apparent difficulties   Cognition Arousal: Alert Behavior During Therapy: Anxious, Impulsive, WFL for tasks assessed/performed Overall Cognitive Status: Within Functional  Limits for tasks assessed                                 General Comments: continues to remove O2 to walk to the bathroom. Educated pt on using call button and waiting for staff to bring portable O2 tank. Spoke with NT to give headsup to bring O2 tank when pt calls  to go to bathroom.     General Comments  Pt on 2L O2 via [place] with SpO2 94>, HR 102-105. Pt up to chair during session.    Exercises     Shoulder Instructions      Home Living Family/patient expects to be discharged to:: Private residence Living Arrangements: Alone Available Help at Discharge: Family;Available PRN/intermittently Type of Home: Apartment Home Access: Stairs to enter Entrance Stairs-Number of Steps: 2 Entrance Stairs-Rails: Right;Left Home Layout: One level     Bathroom Shower/Tub: Walk-in shower         Home Equipment: Agricultural Consultant (2 wheels);Shower seat;Hand held shower head;Grab bars - tub/shower   Additional Comments: daughter lives nearby      Prior Functioning/Environment Prior Level of Function : Independent/Modified Independent             Mobility Comments: recently has begun using rw ADLs Comments: does not drive. Mod I with ADLs        OT Problem List: Decreased strength;Decreased activity tolerance;Impaired balance (sitting and/or standing);Decreased knowledge of precautions;Decreased knowledge of use of DME or AE;Cardiopulmonary status limiting activity      OT Treatment/Interventions: Self-care/ADL training;Therapeutic exercise;Energy conservation;DME and/or AE instruction;Therapeutic activities;Patient/family education;Balance training    OT Goals(Current goals can be found in the care plan section) Acute Rehab OT Goals Patient Stated Goal: breathe easier, values functional independence OT Goal Formulation: With patient Time For Goal Achievement: 08/05/23 Potential to Achieve Goals: Good ADL Goals Pt Will Perform Grooming: with modified independence;standing Pt Will Perform Lower Body Bathing: with modified independence;sit to/from stand Pt Will Perform Lower Body Dressing: with modified independence;sit to/from stand  OT Frequency: Min 2X/week    Co-evaluation              AM-PAC OT 6 Clicks Daily Activity      Outcome Measure Help from another person eating meals?: None Help from another person taking care of personal grooming?: None Help from another person toileting, which includes using toliet, bedpan, or urinal?: A Little Help from another person bathing (including washing, rinsing, drying)?: A Little Help from another person to put on and taking off regular upper body clothing?: None Help from another person to put on and taking off regular lower body clothing?: A Little 6 Click Score: 21   End of Session Equipment Utilized During Treatment: Rolling walker (2 wheels);Oxygen  Nurse Communication: Mobility status;Other (comment) (NT: bring portable O2 tank for bathroom calls)  Activity Tolerance: Patient limited by fatigue;Patient tolerated treatment well Patient left: in chair;with call bell/phone within reach;with chair alarm set  OT Visit Diagnosis: Unsteadiness on feet (R26.81);Muscle weakness (generalized) (M62.81)                Time: 1240-1310 OT Time Calculation (min): 30 min Charges:  OT General Charges $OT Visit: 1 Visit OT Evaluation $OT Eval Low Complexity: 1 Low OT Treatments $Self Care/Home Management : 8-22 mins  Lamarr Hoots, OT Acute Rehabilitation Services Office: [phone]   Hoots Lamarr DEL 07/22/2023, 2:32 PM

## 2023-07-22 NOTE — Plan of Care (Signed)
  Problem: Pain Management: Goal: General experience of comfort will improve Outcome: Progressing   Problem: Safety: Goal: Ability to remain free from injury will improve Outcome: Progressing

## 2023-07-22 NOTE — Plan of Care (Signed)
   Problem: Coping: Goal: Level of anxiety will decrease Outcome: Progressing   Problem: Pain Management: Goal: General experience of comfort will improve Outcome: Progressing   Problem: Safety: Goal: Ability to remain free from injury will improve Outcome: Progressing

## 2023-07-23 ENCOUNTER — Other Ambulatory Visit (HOSPITAL_COMMUNITY): Payer: Self-pay

## 2023-07-23 DIAGNOSIS — F419 Anxiety disorder, unspecified: Secondary | ICD-10-CM | POA: Diagnosis not present

## 2023-07-23 DIAGNOSIS — J441 Chronic obstructive pulmonary disease with (acute) exacerbation: Secondary | ICD-10-CM | POA: Diagnosis not present

## 2023-07-23 DIAGNOSIS — J439 Emphysema, unspecified: Secondary | ICD-10-CM | POA: Diagnosis not present

## 2023-07-23 DIAGNOSIS — F17210 Nicotine dependence, cigarettes, uncomplicated: Secondary | ICD-10-CM | POA: Diagnosis not present

## 2023-07-23 DIAGNOSIS — F32A Depression, unspecified: Secondary | ICD-10-CM | POA: Diagnosis not present

## 2023-07-23 DIAGNOSIS — I10 Essential (primary) hypertension: Secondary | ICD-10-CM | POA: Diagnosis not present

## 2023-07-23 LAB — CBC
HCT: 39 % (ref 36.0–46.0)
Hemoglobin: 12.9 g/dL (ref 12.0–15.0)
MCH: 32.2 pg (ref 26.0–34.0)
MCHC: 33.1 g/dL (ref 30.0–36.0)
MCV: 97.3 fL (ref 80.0–100.0)
Platelets: 588 10*3/uL — ABNORMAL HIGH (ref 150–400)
RBC: 4.01 MIL/uL (ref 3.87–5.11)
RDW: 14.6 % (ref 11.5–15.5)
WBC: 18.5 10*3/uL — ABNORMAL HIGH (ref 4.0–10.5)
nRBC: 0 % (ref 0.0–0.2)

## 2023-07-23 LAB — MAGNESIUM: Magnesium: 1.7 mg/dL (ref 1.7–2.4)

## 2023-07-23 LAB — RESPIRATORY PANEL BY PCR

## 2023-07-23 LAB — BASIC METABOLIC PANEL
Anion gap: 10 (ref 5–15)
BUN: 16 mg/dL (ref 8–23)
CO2: 30 mmol/L (ref 22–32)
Calcium: 9.3 mg/dL (ref 8.9–10.3)
Chloride: 100 mmol/L (ref 98–111)
Creatinine, Ser: 0.88 mg/dL (ref 0.44–1.00)
GFR, Estimated: >60 mL/min (ref >60)
Glucose, Bld: 95 mg/dL (ref 70–99)
Potassium: 4 mmol/L (ref 3.5–5.1)
Sodium: 140 mmol/L (ref 135–145)

## 2023-07-23 MED ORDER — PREDNISONE 20 MG PO TABS
40.0000 mg | ORAL_TABLET | Freq: Every day | ORAL | 0 refills | Status: DC
  Filled 2023-07-23: qty 6, 3d supply, fill #0

## 2023-07-23 MED ORDER — CLONAZEPAM 0.5 MG PO TBDP
0.5000 mg | ORAL_TABLET | Freq: Two times a day (BID) | ORAL | 0 refills | Status: DC
  Filled 2023-07-23 – 2023-08-15 (×2): qty 60, 30d supply, fill #0

## 2023-07-23 MED ORDER — IPRATROPIUM-ALBUTEROL 0.5-2.5 (3) MG/3ML IN SOLN
3.0000 mL | Freq: Four times a day (QID) | RESPIRATORY_TRACT | 0 refills | Status: DC | PRN
  Filled 2023-07-23: qty 360, 30d supply, fill #0

## 2023-07-23 NOTE — Discharge Instructions (Signed)
 You came to the hospital for difficulty breathing and you were diagnosed with a COPD exacerbation.  We treated you with steroids and breathing treatments.    *For your COPD -We have started you on these following medications:  -Prednisone , take 2 tablets of 20mg  prednisone  for a total of 40mg  daily with breakfast. You will have three days of prednisone  outside of the hospital.  -We have provided you a spacer to use with your inhalers you will use the number of puffs instructed and take 5 deep breaths after those initial puffs.  Do not use additional puffs with each breath that you take while using the spacer for your inhalers. -It is essential that you continue to stop smoking cigarettes -Please work with PCP within 1 to 2 weeks -Please follow-up with pulmonologist in 1 to 2 weeks  *For your anxiety -We have increased your home Klonopin  to 0.5 mg twice a day as needed.  Please let your psychiatrist know that the increase this medication due to your increase in anxiety and difficulty breathing. -Please follow-up with your psychiatrist in 2 to 4 weeks  *On a chest CT, there was a nodule that required a 25-month follow-up imaging, please reach out to your PCP and pulmonologist to have this scheduled. You are due for the 8-month follow-up in January.  Follow-up appointments: Please visit your family doctor in 7 to 10 days Please schedule an appointment with her psychiatrist in 2 to 4 weeks Please schedule appoint with your pulmonologist in 2 to 4 weeks If you have any questions or concerns please feel free to call: Internal medicine clinic at [phone]   If you have any of these following symptoms, please call us  or seek care at an emergency department: -Chest Pain -Difficulty Breathing -Syncope (passing out) -Drooping of face -Slurred speech -Sudden weakness in your leg or arm -Fever -Chills   We are glad that you are feeling better, it was a pleasure to care for you!  Damien Lease DO

## 2023-07-23 NOTE — Progress Notes (Signed)
 DISCHARGE NOTE HOME [name] [name] to be discharged Home per MD order. Discussed prescriptions and follow up appointments with the patient. Prescriptions given to patient; medication list explained in detail. Patient verbalized understanding.  Skin clean, dry and intact without evidence of skin break down, no evidence of skin tears noted. IV catheter discontinued intact. Site without signs and symptoms of complications. Dressing and pressure applied. Pt denies pain at the site currently. No complaints noted.  Patient free of lines, drains, and wounds.   An After Visit Summary (AVS) was printed and given to the patient. Patient escorted via wheelchair, to [facility] lounge to wait for ride.  [name], RN

## 2023-07-23 NOTE — Discharge Summary (Signed)
 Name: [name] [name] MRN: [id number] DOB: 1956/06/11 68 y.o. PCP: Vicci Barnie [name], MD  Date of Admission: 07/21/2023  4:43 PM Date of Discharge: 07/23/2023 2:24 PM Attending Physician: Dr. CHARLENA Eastern  Discharge Diagnosis: Principal Problem:   Acute exacerbation of chronic obstructive pulmonary disease (COPD) (HCC) Active Problems:   Hypothyroidism   Essential hypertension, benign   Dyslipidemia   Anxiety and depression   COPD (chronic obstructive pulmonary disease) (HCC)GOLD E   Leukocytosis    Discharge Medications: Allergies as of 07/23/2023       Reactions   Avocado Anaphylaxis   Latex Shortness Of Breath, Rash   Codeine  Nausea Only        Medication List     STOP taking these medications    clonazePAM  0.5 MG tablet Commonly known as: KlonoPIN  Replaced by: clonazePAM  0.5 MG disintegrating tablet       TAKE these medications    alendronate  35 MG tablet Commonly known as: FOSAMAX  Take 1 tablet (35 mg total) by mouth every 7 (seven) days. Take with a full glass of water on an empty stomach. Sit upright for 30-60 mins after taking What changed:  when to take this additional instructions   amLODipine  5 MG tablet Commonly known as: NORVASC  Take 1 tablet (5 mg total) by mouth daily. What changed: when to take this   aspirin  EC 81 MG tablet Take 81 mg by mouth in the morning.   atorvastatin  80 MG tablet Commonly known as: LIPITOR  Take 1 tablet (80 mg total) by mouth daily, please schedule appointment for more refills   Brilinta  60 MG Tabs tablet Generic drug: ticagrelor  Take 1 tablet (60 mg total) by mouth 2 (two) times daily.   busPIRone  15 MG tablet Commonly known as: BUSPAR  Take 2 tablets (30 mg total) by mouth in the morning AND 1 tablet (15 mg total) daily at 12 noon AND 1 tablet (15 mg total) every evening. What changed: See the new instructions.   clonazePAM  0.5 MG disintegrating tablet Commonly known as: KLONOPIN  Take 1 tablet (0.5 mg  total) by mouth 2 (two) times daily. Replaces: clonazePAM  0.5 MG tablet   doxepin  75 MG capsule Commonly known as: SINEQUAN  Take 1 capsule (75 mg total) by mouth at bedtime.   escitalopram  20 MG tablet Commonly known as: LEXAPRO  Take 1 tablet (20 mg total) by mouth daily. What changed: when to take this   ezetimibe  10 MG tablet Commonly known as: ZETIA  Take 1 tablet (10 mg total) by mouth daily. What changed: when to take this   furosemide  20 MG tablet Commonly known as: LASIX  Take 1 tablet (20 mg total) by mouth daily. What changed: when to take this   gabapentin  600 MG tablet Commonly known as: NEURONTIN  TAKE 1 TABLET BY MOUTH 4 TIMES DAILY What changed:  how much to take when to take this   glucose blood test strip Commonly known as: True Metrix Blood Glucose Test Use as instructed   ipratropium-albuterol  0.5-2.5 (3) MG/3ML Soln Commonly known as: DUONEB Take 3 mLs by nebulization every 6 (six) hours as needed.   levothyroxine  112 MCG tablet Commonly known as: SYNTHROID  Take 1 tablet (112 mcg total) by mouth daily. What changed: when to take this   losartan  50 MG tablet Commonly known as: COZAAR  Take 1.5 tablets (75 mg total) by mouth daily. What changed: when to take this   metFORMIN  500 MG tablet Commonly known as: GLUCOPHAGE  Take 1 tablet (500 mg total) by mouth 2 (  two) times daily with a meal. What changed: when to take this   mirtazapine  45 MG tablet Commonly known as: REMERON  Take 1 tablet (45 mg total) by mouth at bedtime.   mometasone -formoterol  100-5 MCG/ACT Aero Commonly known as: DULERA  Inhale 2 puffs into the lungs daily as needed for wheezing or shortness of breath.   montelukast  10 MG tablet Commonly known as: SINGULAIR  Take 1 tablet (10 mg total) by mouth at bedtime.   nebivolol  10 MG tablet Commonly known as: Bystolic  Take 1 tablet (10 mg total) by mouth daily. What changed: when to take this   nitroGLYCERIN  0.4 MG SL  tablet Commonly known as: NITROSTAT  Place 1 tablet (0.4 mg total) under the tongue every 5 (five) minutes as needed for chest pain.   omeprazole  40 MG capsule Commonly known as: PRILOSEC Take 1 capsule (40 mg total) by mouth 2 (two) times daily before a meal.   predniSONE  20 MG tablet Commonly known as: DELTASONE  Take 2 tablets (40 mg total) by mouth daily with breakfast. What changed: See the new instructions.   Spacer/Aero-Holding [employer] Use with the albuterol  inhaler. What changed: additional instructions   Trelegy Ellipta  100-62.5-25 MCG/ACT Aepb Generic drug: Fluticasone -Umeclidin-Vilant Inhale 1 puff into the lungs daily.   triamcinolone  0.025 % ointment Commonly known as: KENALOG  Apply 1 Application topically 2 (two) times daily as needed.   True Metrix Meter Devi 1 kit by Does not apply route 4 (four) times daily.   TRUEplus Lancets 28G Misc 28 g by Does not apply route QID.   varenicline  0.5 MG tablet Commonly known as: Chantix  Initial, 0.5 mg orally once daily for 3 days, then 0.5 mg twice daily on days 4 through 7. Then start taking the 1 mg dose twice daily for a total of 12 weeks. What changed:  how much to take how to take this when to take this Another medication with the same name was removed. Continue taking this medication, and follow the directions you see here.   Ventolin  HFA 108 (90 Base) MCG/ACT inhaler Generic drug: albuterol  INHALE 2 PUFFS INTO THE LUNGS EVERY 6 HOURS AS NEEDED FOR WHEEZING OR SHORTNESS OF BREATH        Disposition and follow-up:   [name] [name] was discharged from [facility] in Good condition.  At the hospital follow up visit please address:  1.  Follow-up:  *COPD Exacerbation  -Ensure patient finishes steroid therapy -Assess whether patient is using inhalers and spacer correct -Assess whether patient requires refills on inhalers or nebulizers -Please go over respiratory panel with patient,  it appears that she tested positive for rhinovirus after being discharged.  I was able to contact emergency contact who reported that she will notify the patient, but please notify patient during appointment.  *Tobacco cessation -Provide resources for cessation -Encourage patient use of Chantix   *Anxiety -Please note that patient's Klonopin  was increased to 0.5 mg twice daily  *Pulmonary Nodules: Per Radiology - CT chest on 02/07/2023 with  Lung-RADS 3, probably benign findings. Short-term follow-up in 6 months is recommended with repeat low-dose chest CT without contrast (please use the following order, CT CHEST LCS NODULE FOLLOW-UP W/O CM). Three new scattered solid pulmonary nodules, largest 5.0 mm in the posterior left upper lobe.  -Patient is due for low-dose chest CT without contrast for a 69-month follow-up   2.  Labs / imaging needed at time of follow-up: Low-dose chest CT without contrast   3.  Pending labs/  test needing follow-up: N/A  4.  Medication Changes  STOPPED  -N/A   ADDED  -Prednisone  40 mg for 3-day outpatient therapy, completed 2 days inpatient    MODIFIED  -Increased Klonopin  0.25 mg BID to klonopin  0.5mg  BID     Follow-up Appointments:  Follow-up Information     [facility] Follow up.   Specialty: Home Health Services Contact information: [address] [place] [address] [phone]         Vicci Barnie NOVAK, MD Follow up in 1 week(s).   Specialty: Internal Medicine Contact information: [address] [place] [address] [place] [address] [phone]                 Hospital Course by problem list: #COPD exacerbation  Patient is admitted for another COPD exacerbation, she was discharged one week ago.  Patient tested negative for influenza, RSV, COVID.  She did test positive for rhinovirus.  Originally we were suspicious that her reduced klonopin  dose was driving/exacerbating her acute respiratory symptoms.   It appears that her rhinovirus was possibly the cause of her COPD exacerbation.  Initial physical exam was remarkable for diffuse expiratory wheezes that improved on the day of discharge.  We treated her COPD exacerbation with prednisone  40 mg for 2 days while inpatient, she was provided a 3-day outpatient course.  Patient was also provided Brovana , Pulmicort , Yuperli, duo nebulizer therapy while hospitalized.  She was also provided a spacer to use with her inhaler and educated on proper usage.  She was discharged home on 2 L of supplemental oxygen  nasal cannula. Patient's viral panel came back after she was discharged, I was able to contact her emergency contact (who will notify the patient) and go over the test results that patient is positive for rhinovirus.  I was unable to contact the patient directly after attempting multiple phone calls.  #Tobacco use disorder  Patient is on Chantix  at home which is helping her tobacco cessation.  Patient was provided nicotine  patches while hospitalized and encouraged to use Chantix  to stop smoking.  Patient was provided education on smoking cessation.  #Anxiety/Depression Home regimen of Buspar  60mg  at bedtime, Klonipin 0.25mg  BID, Doxepin  75mg  daily, Remeron  45mg  at bedtime. Patient was recently seen by psychiatry a month ago who decreased the klonopin  to 0.25mg  BID. Since then, she reports increase in prn needs and anxiety/panic symptoms.  We increased her Klonopin  to 0.5 mg twice a day and she reported that her anxiety was more controlled as well as her breathing.  She was discharged home on the increased dose of Klonopin  0.5 mg twice a day and instructed to follow-up with her psychiatrist  #Leukocytosis  Most likely due to steroid use.   #Pulmonary Nodules: Per Radiology - CT chest on 02/07/2023 with  Lung-RADS 3, probably benign findings. Short-term follow-up in 6 months is recommended with repeat low-dose chest CT without contrast (please use the following  order, CT CHEST LCS NODULE FOLLOW-UP W/O CM). Three new scattered solid pulmonary nodules, largest 5.0 mm in the posterior left upper lobe.    Chronic Conditions: HFpEF: Continued home lasix  20 mg, nebivolol  10 mg daily.  CAD s/p stenting: Continued home ticagrelor  60 mg BID, ASA 81 mg daily. Lipitor  80mg  daily. Zetia  10mg  daily. HTN: amlodipine  5 mg daily, losartan  75 mg daily. Prediabetes: holding metformin  500 mg Hypothyroidism: TSH one month ago wnl. Continued home synthroid  112 mcg daily.     Discharge Subjective: Patient reported feeling better  this morning.  She stated that her breathing is better along with her wheezing although she still has wheezing there.  Patient was able to demonstrate how to use a spacer and was educated on inhalers and spacer use.  Patient understood the discharge instructions.  Patient was not hesitant to go home today, our team was able to provide additional reassurance that her wheezing may persist for a few days to a week after she goes home.  Discharge Exam:   Blood pressure 122/66, pulse 65, temperature 97.8 F (36.6 C), temperature source Oral, resp. rate 18, height 4' 9 (1.448 m), weight 66.7 kg, SpO2 94%.  Constitutional: Chronically ill -appearing, laying in bed, in no acute distress HENT: normocephalic atraumatic Pulmonary/Chest: normal work of breathing on room air, improved bilateral lower lobe expiratory wheezes Neurological: alert & oriented x 3, participating in conversation Skin: warm and dry Psych: Slightly anxious and normal affect  Pertinent Labs, Studies, and Procedures:     Latest Ref Rng & Units 07/23/2023    7:04 AM 07/22/2023    6:48 AM 07/21/2023    5:55 PM  CBC  WBC 4.0 - 10.5 K/uL 18.5  16.3  17.2   Hemoglobin 12.0 - 15.0 g/dL 87.0  87.0  86.7   Hematocrit 36.0 - 46.0 % 39.0  39.3  39.9   Platelets 150 - 400 K/uL 588  566  555        Latest Ref Rng & Units 07/23/2023    7:04 AM 07/22/2023    6:48 AM 07/21/2023    5:55 PM  CMP   Glucose 70 - 99 mg/dL 95  771  861   BUN 8 - 23 mg/dL 16  15  6    Creatinine 0.44 - 1.00 mg/dL 9.11  9.15  9.35   Sodium 135 - 145 mmol/L 140  135  139   Potassium 3.5 - 5.1 mmol/L 4.0  5.0  3.9   Chloride 98 - 111 mmol/L 100  100  101   CO2 22 - 32 mmol/L 30  22  26    Calcium  8.9 - 10.3 mg/dL 9.3  9.3  9.1   Total Protein 6.5 - 8.1 g/dL  6.5  6.5   Total Bilirubin 0.0 - 1.2 mg/dL  0.4  0.6   Alkaline Phos 38 - 126 U/L  85  87   AST 15 - 41 U/L  18  18   ALT 0 - 44 U/L  19  19     DG Chest Port 1 View Result Date: 07/21/2023 CLINICAL DATA:  Shortness of breath EXAM: PORTABLE CHEST 1 VIEW COMPARISON:  07/11/2023 FINDINGS: Cardiac shadow is within normal limits. Aortic calcifications are seen. The lungs are well aerated bilaterally. No focal infiltrate or sizable effusion is noted. No acute bony abnormality is noted. Old healed clavicular fracture is noted. IMPRESSION: No active disease. Electronically Signed   By: Oneil Devonshire M.D.   On: 07/21/2023 18:16     Discharge Instructions: Discharge Instructions     Call MD for:  difficulty breathing, headache or visual disturbances   Complete by: As directed    Call MD for:  extreme fatigue   Complete by: As directed    Call MD for:  hives   Complete by: As directed    Call MD for:  persistant dizziness or light-headedness   Complete by: As directed    Call MD for:  persistant nausea and vomiting   Complete by: As directed  Call MD for:  redness, tenderness, or signs of infection (pain, swelling, redness, odor or green/yellow discharge around incision site)   Complete by: As directed    Call MD for:  severe uncontrolled pain   Complete by: As directed    Call MD for:  temperature >100.4   Complete by: As directed    Diet - low sodium heart healthy   Complete by: As directed    Discharge instructions   Complete by: As directed    You came to the hospital for difficulty breathing and you were diagnosed with a COPD exacerbation.  We  treated you with steroids and breathing treatments.    *For your COPD -We have started you on these following medications:  -Prednisone , take 2 tablets of 20mg  prednisone  for a total of 40mg  daily with breakfast. You will have three days of prednisone  outside of the hospital.  -We have provided you a spacer to use with your inhalers you will use the number of puffs instructed and take 5 deep breaths after those initial puffs.  Do not use additional puffs with each breath that you take while using the spacer for your inhalers. -It is essential that you continue to stop smoking cigarettes -Please work with PCP within 1 to 2 weeks -Please follow-up with pulmonologist in 1 to 2 weeks  *For your anxiety -We have increased your home Klonopin  to 0.5 mg twice a day as needed.  Please let your psychiatrist know that the increase this medication due to your increase in anxiety and difficulty breathing. -Please follow-up with your psychiatrist in 2 to 4 weeks  *On a chest CT, there was a nodule that required a 50-month follow-up imaging, please reach out to your PCP and pulmonologist to have this scheduled. You are due for the 37-month follow-up in January.  Follow-up appointments: Please visit your family doctor in 7 to 10 days Please schedule an appointment with her psychiatrist in 2 to 4 weeks Please schedule appoint with your pulmonologist in 2 to 4 weeks If you have any questions or concerns please feel free to call: Internal medicine clinic at [phone]   If you have any of these following symptoms, please call us  or seek care at an emergency department: -Chest Pain -Difficulty Breathing -Syncope (passing out) -Drooping of face -Slurred speech -Sudden weakness in your leg or arm -Fever -Chills   We are glad that you are feeling better, it was a pleasure to care for you!  Damien Lease DO   Increase activity slowly   Complete by: As directed        Signed: Damien Lease  DO [facility] - PGY1 Pager: [phone] 07/23/2023, 2:24 PM    Please contact the on call pager after 5 pm and on weekends at [phone].

## 2023-07-24 ENCOUNTER — Telehealth: Payer: Self-pay

## 2023-07-24 NOTE — Transitions of Care (Post Inpatient/ED Visit) (Signed)
   07/24/2023  Name: [name] [name] MRN: [id number] DOB: [birth year]  Today's TOC FU Call Status: Today's TOC FU Call Status:: Unsuccessful Call (1st Attempt) Unsuccessful Call (1st Attempt) Date: 07/24/23  Attempted to reach the patient regarding the most recent Inpatient/ED visit.  Follow Up Plan: Additional outreach attempts will be made to reach the patient to complete the Transitions of Care (Post Inpatient/ED visit) call.   Signature  Slater Diesel, RN

## 2023-07-25 DIAGNOSIS — I5032 Chronic diastolic (congestive) heart failure: Secondary | ICD-10-CM | POA: Diagnosis not present

## 2023-07-25 DIAGNOSIS — J449 Chronic obstructive pulmonary disease, unspecified: Secondary | ICD-10-CM | POA: Diagnosis not present

## 2023-07-25 DIAGNOSIS — I110 Hypertensive heart disease with heart failure: Secondary | ICD-10-CM | POA: Diagnosis not present

## 2023-07-25 DIAGNOSIS — J9811 Atelectasis: Secondary | ICD-10-CM | POA: Diagnosis not present

## 2023-07-25 DIAGNOSIS — F419 Anxiety disorder, unspecified: Secondary | ICD-10-CM | POA: Diagnosis not present

## 2023-07-25 DIAGNOSIS — F17210 Nicotine dependence, cigarettes, uncomplicated: Secondary | ICD-10-CM | POA: Diagnosis not present

## 2023-07-25 DIAGNOSIS — I2510 Atherosclerotic heart disease of native coronary artery without angina pectoris: Secondary | ICD-10-CM | POA: Diagnosis not present

## 2023-07-25 DIAGNOSIS — F32A Depression, unspecified: Secondary | ICD-10-CM | POA: Diagnosis not present

## 2023-07-25 DIAGNOSIS — J9601 Acute respiratory failure with hypoxia: Secondary | ICD-10-CM | POA: Diagnosis not present

## 2023-07-27 DIAGNOSIS — F419 Anxiety disorder, unspecified: Secondary | ICD-10-CM | POA: Diagnosis not present

## 2023-07-27 DIAGNOSIS — J449 Chronic obstructive pulmonary disease, unspecified: Secondary | ICD-10-CM | POA: Diagnosis not present

## 2023-07-27 DIAGNOSIS — I110 Hypertensive heart disease with heart failure: Secondary | ICD-10-CM | POA: Diagnosis not present

## 2023-07-27 DIAGNOSIS — I2510 Atherosclerotic heart disease of native coronary artery without angina pectoris: Secondary | ICD-10-CM | POA: Diagnosis not present

## 2023-07-27 DIAGNOSIS — J9601 Acute respiratory failure with hypoxia: Secondary | ICD-10-CM | POA: Diagnosis not present

## 2023-07-27 DIAGNOSIS — J9811 Atelectasis: Secondary | ICD-10-CM | POA: Diagnosis not present

## 2023-07-27 DIAGNOSIS — I5032 Chronic diastolic (congestive) heart failure: Secondary | ICD-10-CM | POA: Diagnosis not present

## 2023-07-27 DIAGNOSIS — F32A Depression, unspecified: Secondary | ICD-10-CM | POA: Diagnosis not present

## 2023-07-27 DIAGNOSIS — F17210 Nicotine dependence, cigarettes, uncomplicated: Secondary | ICD-10-CM | POA: Diagnosis not present

## 2023-07-28 ENCOUNTER — Telehealth: Payer: Self-pay

## 2023-07-28 NOTE — Transitions of Care (Post Inpatient/ED Visit) (Signed)
   07/28/2023  Name: [name] [name] MRN: [id number] DOB: 1955/09/22  Today's TOC FU Call Status: Unsuccessful Call (1st Attempt) Date: 07/24/23 Unsuccessful Call (2nd Attempt) Date: 07/28/23  Attempted to reach the patient regarding the most recent Inpatient/ED visit.  Follow Up Plan: Additional outreach attempts will be made to reach the patient to complete the Transitions of Care (Post Inpatient/ED visit) call.   Signature  Slater Diesel, RN

## 2023-07-29 ENCOUNTER — Telehealth: Payer: Self-pay

## 2023-07-29 NOTE — Transitions of Care (Post Inpatient/ED Visit) (Signed)
   07/29/2023  Name: [name] [name] MRN: [id number] DOB: [birth year]  Today's TOC FU Call Status: Today's TOC FU Call Status:: Successful TOC FU Call Completed Unsuccessful Call (1st Attempt) Date: 07/24/23 Unsuccessful Call (2nd Attempt) Date: 07/28/23 Unsuccessful Call (3rd Attempt) Date: 07/29/23 [facility] FU Call Complete Date: 07/29/23 Patient's Name and Date of Birth confirmed.  Transition Care Management Follow-up Telephone Call Date of Discharge: 07/23/23 Discharge Facility: [facility]) Type of Discharge: Inpatient Admission Primary Inpatient Discharge Diagnosis:: COPD exacerbation How have you been since you were released from the hospital?: Better Any questions or concerns?: No  Items Reviewed: Did you receive and understand the discharge instructions provided?: Yes Medications obtained,verified, and reconciled?: No Medications Not Reviewed Reasons:: Other: (She said she did not need to review the med list because the home health nurse already reviewed it with her and she did not have any questions about the med regime.  She confirmed that she has a nebulizer.) Any new allergies since your discharge?: No Dietary orders reviewed?: Yes Type of Diet Ordered:: heart healthy,low sodium Do you have support at home?: Yes People in Home: alone Name of Support/Comfort Primary Source: lives alone but her daughter lives close and checks on her regularly.  Medications Reviewed Today: Medications Reviewed Today   Medications were not reviewed in this encounter     Home Care and Equipment/Supplies: Were Home Health Services Ordered?: Yes Name of Home Health Agency:: [facility] Has Agency set up a time to come to your home?: Yes First Home Health Visit Date: 07/26/23 Any new equipment or medical supplies ordered?: No  Functional Questionnaire: Do you need assistance with bathing/showering or dressing?: No Do you need assistance with meal preparation?: No Do you need assistance with  eating?: No Do you have difficulty maintaining continence: No Do you need assistance with getting out of bed/getting out of a chair/moving?: No (She said she has a walker but does not need it.  She uses her O2 @ 2 L continuously) Do you have difficulty managing or taking your medications?: Yes (her daughter sets up her medications in a med box for her)  Follow up appointments reviewed: PCP Follow-up appointment confirmed?: Yes Date of PCP follow-up appointment?: 08/07/23 Follow-up Provider: [facility] Follow-up appointment confirmed?: Yes Date of Specialist follow-up appointment?: 08/07/23 Follow-Up Specialty Provider:: pulmonary.  She said it is not a problem to have the 2 appointments back to back on the same day. She said she also has an eye appointment that afternoon. Do you need transportation to your follow-up appointment?: No Do you understand care options if your condition(s) worsen?: Yes-patient verbalized understanding    SIGNATURE Slater Diesel, RN

## 2023-07-29 NOTE — Transitions of Care (Post Inpatient/ED Visit) (Signed)
   07/29/2023  Name: [name] [name] MRN: [id number] DOB: 1955/12/01  Today's TOC FU Call Status: Today's TOC FU Call Status:: Unsuccessful Call (3rd Attempt) Unsuccessful Call (1st Attempt) Date: 07/24/23 Unsuccessful Call (2nd Attempt) Date: 07/28/23 Unsuccessful Call (3rd Attempt) Date: 07/29/23  Attempted to reach the patient regarding the most recent Inpatient/ED visit.  Follow Up Plan: No further outreach attempts will be made at this time. We have been unable to contact the patient.  The patient has an appointment with Dr Vicci at [facility] on 08/07/2023.  Signature Slater Diesel, RN

## 2023-07-29 NOTE — Telephone Encounter (Signed)
 Message received that the patient returned my call.   I called her back and had to leave a message requesting she call me when she has a chance.

## 2023-07-30 ENCOUNTER — Encounter: Payer: Self-pay | Admitting: Hematology

## 2023-08-04 ENCOUNTER — Telehealth: Payer: Self-pay | Admitting: Internal Medicine

## 2023-08-04 NOTE — Telephone Encounter (Signed)
Home Health Verbal Orders - Caller/Agency: Beth with [facility] Callback Number: [phone] Service Requested: Skilled Nursing Frequency: 1 x a wk for 2 wks for COPD management Any new concerns about the patient? Yes further teaching for COPD management  Please assist further

## 2023-08-04 NOTE — Telephone Encounter (Signed)
Spoke with Beth with [facility] requesting an extension of Nursing orders Frequency: 1 x a wk for 2 wks for COPD management VO given

## 2023-08-05 ENCOUNTER — Encounter: Payer: Self-pay | Admitting: Hematology

## 2023-08-05 DIAGNOSIS — F419 Anxiety disorder, unspecified: Secondary | ICD-10-CM | POA: Diagnosis not present

## 2023-08-05 DIAGNOSIS — F32A Depression, unspecified: Secondary | ICD-10-CM | POA: Diagnosis not present

## 2023-08-05 DIAGNOSIS — I110 Hypertensive heart disease with heart failure: Secondary | ICD-10-CM | POA: Diagnosis not present

## 2023-08-05 DIAGNOSIS — I5032 Chronic diastolic (congestive) heart failure: Secondary | ICD-10-CM | POA: Diagnosis not present

## 2023-08-05 DIAGNOSIS — I2510 Atherosclerotic heart disease of native coronary artery without angina pectoris: Secondary | ICD-10-CM | POA: Diagnosis not present

## 2023-08-05 DIAGNOSIS — J449 Chronic obstructive pulmonary disease, unspecified: Secondary | ICD-10-CM | POA: Diagnosis not present

## 2023-08-05 DIAGNOSIS — F17210 Nicotine dependence, cigarettes, uncomplicated: Secondary | ICD-10-CM | POA: Diagnosis not present

## 2023-08-05 DIAGNOSIS — J9811 Atelectasis: Secondary | ICD-10-CM | POA: Diagnosis not present

## 2023-08-05 DIAGNOSIS — J9601 Acute respiratory failure with hypoxia: Secondary | ICD-10-CM | POA: Diagnosis not present

## 2023-08-07 ENCOUNTER — Ambulatory Visit (INDEPENDENT_AMBULATORY_CARE_PROVIDER_SITE_OTHER): Payer: Medicare HMO | Admitting: Pulmonary Disease

## 2023-08-07 ENCOUNTER — Ambulatory Visit: Payer: Medicare HMO | Attending: Internal Medicine | Admitting: Internal Medicine

## 2023-08-07 ENCOUNTER — Other Ambulatory Visit: Payer: Self-pay

## 2023-08-07 ENCOUNTER — Encounter: Payer: Self-pay | Admitting: Pulmonary Disease

## 2023-08-07 ENCOUNTER — Other Ambulatory Visit (HOSPITAL_COMMUNITY): Payer: Self-pay

## 2023-08-07 ENCOUNTER — Encounter: Payer: Self-pay | Admitting: Internal Medicine

## 2023-08-07 VITALS — BP 108/64 | HR 78 | Temp 97.2°F | Ht 59.0 in | Wt 153.0 lb

## 2023-08-07 VITALS — BP 96/58 | HR 72 | Ht <= 58 in | Wt 150.0 lb

## 2023-08-07 DIAGNOSIS — Z9981 Dependence on supplemental oxygen: Secondary | ICD-10-CM

## 2023-08-07 DIAGNOSIS — Z7984 Long term (current) use of oral hypoglycemic drugs: Secondary | ICD-10-CM

## 2023-08-07 DIAGNOSIS — Z596 Low income: Secondary | ICD-10-CM | POA: Diagnosis not present

## 2023-08-07 DIAGNOSIS — F17200 Nicotine dependence, unspecified, uncomplicated: Secondary | ICD-10-CM

## 2023-08-07 DIAGNOSIS — J441 Chronic obstructive pulmonary disease with (acute) exacerbation: Secondary | ICD-10-CM | POA: Diagnosis not present

## 2023-08-07 DIAGNOSIS — E119 Type 2 diabetes mellitus without complications: Secondary | ICD-10-CM

## 2023-08-07 DIAGNOSIS — R918 Other nonspecific abnormal finding of lung field: Secondary | ICD-10-CM | POA: Diagnosis not present

## 2023-08-07 DIAGNOSIS — E1165 Type 2 diabetes mellitus with hyperglycemia: Secondary | ICD-10-CM | POA: Diagnosis not present

## 2023-08-07 DIAGNOSIS — F17210 Nicotine dependence, cigarettes, uncomplicated: Secondary | ICD-10-CM | POA: Diagnosis not present

## 2023-08-07 DIAGNOSIS — J9611 Chronic respiratory failure with hypoxia: Secondary | ICD-10-CM

## 2023-08-07 DIAGNOSIS — Z5941 Food insecurity: Secondary | ICD-10-CM

## 2023-08-07 DIAGNOSIS — Z09 Encounter for follow-up examination after completed treatment for conditions other than malignant neoplasm: Secondary | ICD-10-CM

## 2023-08-07 DIAGNOSIS — J449 Chronic obstructive pulmonary disease, unspecified: Secondary | ICD-10-CM

## 2023-08-07 LAB — POCT GLYCOSYLATED HEMOGLOBIN (HGB A1C): HbA1c, POC (controlled diabetic range): 6.8 % (ref 0.0–7.0)

## 2023-08-07 LAB — GLUCOSE, POCT (MANUAL RESULT ENTRY): POC Glucose: 119 mg/dL — AB (ref 70–99)

## 2023-08-07 MED ORDER — AZITHROMYCIN 250 MG PO TABS
ORAL_TABLET | ORAL | 0 refills | Status: DC
  Filled 2023-08-07: qty 6, 5d supply, fill #0

## 2023-08-07 MED ORDER — PREDNISONE 10 MG PO TABS
ORAL_TABLET | ORAL | 0 refills | Status: DC
  Filled 2023-08-07: qty 30, 12d supply, fill #0

## 2023-08-07 MED ORDER — TRELEGY ELLIPTA 200-62.5-25 MCG/ACT IN AEPB
1.0000 | INHALATION_SPRAY | Freq: Every day | RESPIRATORY_TRACT | 11 refills | Status: AC
  Filled 2023-08-07: qty 60, 30d supply, fill #0
  Filled 2023-08-30: qty 60, 30d supply, fill #1
  Filled 2023-09-29: qty 60, 30d supply, fill #2
  Filled 2023-10-29: qty 60, 30d supply, fill #3
  Filled 2023-11-24: qty 60, 30d supply, fill #4
  Filled 2023-12-22: qty 60, 30d supply, fill #5
  Filled 2024-01-20: qty 60, 30d supply, fill #6
  Filled 2024-02-16: qty 60, 30d supply, fill #7
  Filled 2024-03-15: qty 60, 30d supply, fill #8
  Filled 2024-04-12: qty 60, 30d supply, fill #9
  Filled 2024-05-10: qty 60, 30d supply, fill #10
  Filled 2024-06-09: qty 60, 30d supply, fill #11

## 2023-08-07 NOTE — Patient Instructions (Addendum)
We will walk you today to try to qualify you for a portable oxygen concentrator  Start prednisone taper 40mg  daily x 3 days 30mg  daily x 3 days 20mg  daily x 3 days 10mg  daily x 3 days  Start Zpak antibiotic   Recommend nicotine patches 21mg  daily and use mini nicotine lozenges 2 or 4mg  as needed for break through cravings.   Start Trelegy 200, new prescription sent in  Continue duonebs as needed every 6 hours  Follow up in 3 months

## 2023-08-07 NOTE — Progress Notes (Signed)
Patient ID: [name] [name], female    DOB: 03-May-1956  MRN: [id number]  CC: Hospitalization Follow-up (Hospitalization f/u. Denton Meek about on-going sinus issues/Yes to shingles vax)   Subjective: [name] [name] is a 68 y.o. female who presents for chronic ds management.  Her daughter [name] [name] is with her. Her concerns today include:  Patient with history of pre-DM, GERD, hypothyroidism, HTN, HL, CAD with previous stent, COPD, substance abuse, depression/anxiety and tobacco dependence, essential thrombocytosis, iron deficiency anemia    Hospitalized 11/28-12/12/2022 with COPD exacerbation requiring BiPAP in the ICU.  Found to have parainfluenza virus.  Discharged on O2 2 L continuous -Hospitalization: 12/27-31/24 with COPD exacerbation and sinusitis. Hospitalization 1/6-02/2024 again with COPD exacerbation  Today: Discussed the use of AI scribe software for clinical note transcription with the patient, who gave verbal consent to proceed.  History of Present Illness   The patient, with a history of COPD, presents with three recent hospitalizations due to exacerbations of her condition.  Hospitalized 11/28-12/12/2022 with COPD exacerbation requiring BiPAP in the ICU.  Found to have parainfluenza virus.  Discharged on O2 2 L continuous -Hospitalization: 12/27-31/24 with COPD exacerbation and sinusitis. Hospitalization 1/6-02/2024 again with COPD exacerbation  Today:  She is now on 2 L continuous oxygen therapy.  She has a home pulse ox device.  Reports oxygen level is between 92 to 94% on 2 L at rest and 82 to 86% on 2 L with ambulation.  Oxygen level dropped into the 70s without oxygen.  She describes her breathing as "terrible," with chest congestion and coughing. Cold weather exacerbates her symptoms.  The patient is two inhalers, including Trelegy and Dulera, and uses a DuoNeb nebulizer twice daily. She has a portable small tank and  a home concentrator for her oxygen therapy.  She has  completed oxygen and prednisone taper.  He has a home health agency coming out.  The nurse helps her with doing breathing exercises which she finds helpful. The patient continues to smoke a pack of cigarettes daily, despite understanding the detrimental effects on her respiratory health. She has tried to quit in the past using Chantix, but was unsuccessful. She expresses a lack of readiness to quit at this time.  The patient also has diabetes and hypertension, for which she is taking metformin 500 mg twice a day, Cozaar 50 mg daily  and nebivolol 10 mg daily.  I received a request from her eye doctor to clear her for cataracts surgery.      Patient Active Problem List   Diagnosis Date Noted   Leukocytosis 07/21/2023   Infection due to parainfluenza virus 4 06/20/2023   Full code status 06/14/2023   Acute hypoxic respiratory failure (HCC) 06/13/2023   Acute exacerbation of chronic obstructive pulmonary disease (COPD) (HCC) 06/12/2023   Nicotine dependence 05/03/2023   Left ankle sprain 04/17/2023   Substance abuse (HCC) 09/10/2022   Nontraumatic complete tear of right rotator cuff    Superior glenoid labrum lesion of left shoulder    Iron deficiency anemia 11/02/2021   Major depressive disorder, recurrent, in full remission with anxious distress (HCC) 05/31/2021   Rotator cuff tendinitis, right 05/08/2021   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   Chronic neck pain 02/26/2021   GAD (generalized anxiety disorder) 01/08/2021   Acute cystitis without hematuria 11/07/2020   Influenza vaccine needed 06/15/2020   Abdominal pain, epigastric 04/28/2019   Esophageal dysphagia 04/28/2019   Bipolar I disorder (HCC) 09/17/2018   Insomnia 10/28/2017  Prediabetes 05/28/2017   QT prolongation 03/25/2017   Encounter for screening mammogram for breast cancer 09/11/2015   Psoriasis 06/22/2015   COPD (chronic obstructive pulmonary disease) (HCC)GOLD E 05/18/2015   Anxiety and depression  07/10/2013   Essential hypertension, benign 06/07/2013   Dyslipidemia 06/07/2013   Asthma, chronic 06/07/2013   Emphysema lung (HCC) 06/07/2013   Chronic back pain 06/07/2013   CAD S/P percutaneous coronary angioplasty 06/07/2013   GERD (gastroesophageal reflux disease) 06/07/2013   Breast lump on left side at 3 o'clock position 10/13/2012   S/P abdominal hysterectomy and right salpingo-oophorectomy 08/29/2011   COPD exacerbation (HCC) 09/15/2009   TOBACCO ABUSE 07/17/2009   Chronic rhinitis 07/17/2009   Lung nodule < 6cm on CT 07/17/2009   ALLERGY, FOOD 07/17/2009   Hypothyroidism 12/22/2008     Current Outpatient Medications on File Prior to Visit  Medication Sig Dispense Refill   albuterol (VENTOLIN HFA) 108 (90 Base) MCG/ACT inhaler INHALE 2 PUFFS INTO THE LUNGS EVERY 6 HOURS AS NEEDED FOR WHEEZING OR SHORTNESS OF BREATH 54 g 0   alendronate (FOSAMAX) 35 MG tablet Take 1 tablet (35 mg total) by mouth every 7 (seven) days. Take with a full glass of water on an empty stomach. Sit upright for 30-60 mins after taking (Patient taking differently: Take 35 mg by mouth every Wednesday.) 12 tablet 1   amLODipine (NORVASC) 5 MG tablet Take 1 tablet (5 mg total) by mouth daily. (Patient taking differently: Take 5 mg by mouth in the morning.) 90 tablet 3   aspirin EC 81 MG tablet Take 81 mg by mouth in the morning.     atorvastatin (LIPITOR) 80 MG tablet Take 1 tablet (80 mg total) by mouth daily, please schedule appointment for more refills 90 tablet 3   busPIRone (BUSPAR) 15 MG tablet Take 2 tablets (30 mg total) by mouth in the morning AND 1 tablet (15 mg total) daily at 12 noon AND 1 tablet (15 mg total) every evening. (Patient taking differently: Take 2 tablets (30 mg total) by mouth in the morning AND 1 tablet (15 mg total) daily at 12 noon AND 1 tablet (15 mg total) every evening. (Patient takes 4 tabs every night at bedtime)) 120 tablet 3   clonazePAM (KLONOPIN) 0.5 MG disintegrating tablet  Take 1 tablet (0.5 mg total) by mouth 2 (two) times daily. 60 tablet 0   doxepin (SINEQUAN) 75 MG capsule Take 1 capsule (75 mg total) by mouth at bedtime. 30 capsule 3   escitalopram (LEXAPRO) 20 MG tablet Take 1 tablet (20 mg total) by mouth daily. (Patient taking differently: Take 20 mg by mouth in the morning.) 30 tablet 3   ezetimibe (ZETIA) 10 MG tablet Take 1 tablet (10 mg total) by mouth daily. (Patient taking differently: Take 10 mg by mouth at bedtime.) 90 tablet 3   Fluticasone-Umeclidin-Vilant (TRELEGY ELLIPTA) 100-62.5-25 MCG/ACT AEPB Inhale 1 puff into the lungs daily. 60 each 10   furosemide (LASIX) 20 MG tablet Take 1 tablet (20 mg total) by mouth daily. (Patient taking differently: Take 20 mg by mouth in the morning.) 90 tablet 3   gabapentin (NEURONTIN) 600 MG tablet TAKE 1 TABLET BY MOUTH 4 TIMES DAILY (Patient taking differently: Take 600 mg by mouth 2 (two) times daily.) 360 tablet 2   glucose blood (TRUE METRIX BLOOD GLUCOSE TEST) test strip Use as instructed 100 each 12   ipratropium-albuterol (DUONEB) 0.5-2.5 (3) MG/3ML SOLN Take 3 mLs by nebulization every 6 (six) hours as needed.  360 mL 0   levothyroxine (SYNTHROID) 112 MCG tablet Take 1 tablet (112 mcg total) by mouth daily. (Patient taking differently: Take 112 mcg by mouth in the morning.) 90 tablet 2   losartan (COZAAR) 50 MG tablet Take 1.5 tablets (75 mg total) by mouth daily. (Patient taking differently: Take 75 mg by mouth in the morning.) 135 tablet 3   metFORMIN (GLUCOPHAGE) 500 MG tablet Take 1 tablet (500 mg total) by mouth 2 (two) times daily with a meal. (Patient taking differently: Take 500 mg by mouth in the morning.) 180 tablet 1   mirtazapine (REMERON) 45 MG tablet Take 1 tablet (45 mg total) by mouth at bedtime. 30 tablet 3   mometasone-formoterol (DULERA) 100-5 MCG/ACT AERO Inhale 2 puffs into the lungs daily as needed for wheezing or shortness of breath.     montelukast (SINGULAIR) 10 MG tablet Take 1  tablet (10 mg total) by mouth at bedtime. 90 tablet 2   nebivolol (BYSTOLIC) 10 MG tablet Take 1 tablet (10 mg total) by mouth daily. (Patient taking differently: Take 10 mg by mouth at bedtime.) 90 tablet 3   omeprazole (PRILOSEC) 40 MG capsule Take 1 capsule (40 mg total) by mouth 2 (two) times daily before a meal. 60 capsule 5   predniSONE (DELTASONE) 20 MG tablet Take 2 tablets (40 mg total) by mouth daily with breakfast. 6 tablet 0   Spacer/Aero-Holding Chambers DEVI Use with the albuterol inhaler. 1 each 0   ticagrelor (BRILINTA) 60 MG TABS tablet Take 1 tablet (60 mg total) by mouth 2 (two) times daily. 60 tablet 52   triamcinolone (KENALOG) 0.025 % ointment Apply 1 Application topically 2 (two) times daily as needed. 80 g 0   TRUEPLUS LANCETS 28G MISC 28 g by Does not apply route QID. 120 each 2   Blood Glucose Monitoring Suppl (TRUE METRIX METER) DEVI 1 kit by Does not apply route 4 (four) times daily. (Patient not taking: Reported on 08/07/2023) 1 Device 0   nitroGLYCERIN (NITROSTAT) 0.4 MG SL tablet Place 1 tablet (0.4 mg total) under the tongue every 5 (five) minutes as needed for chest pain. (Patient not taking: Reported on 07/21/2023) 25 tablet 3   varenicline (CHANTIX) 0.5 MG tablet Initial, 0.5 mg orally once daily for 3 days, then 0.5 mg twice daily on days 4 through 7. Then start taking the 1 mg dose twice daily for a total of 12 weeks. (Patient not taking: Reported on 08/07/2023) 11 tablet 0   No current facility-administered medications on file prior to visit.    Allergies  Allergen Reactions   Avocado Anaphylaxis   Latex Shortness Of Breath and Rash   Codeine Nausea Only    Social History   Socioeconomic History   Marital status: Divorced    Spouse name: Not on file   Number of children: 1   Years of education: Not on file   Highest education level: 9th grade  Occupational History   Not on file  Tobacco Use   Smoking status: Every Day    Current packs/day: 0.50     Average packs/day: 0.5 packs/day for 50.0 years (25.0 ttl pk-yrs)    Types: Cigarettes   Smokeless tobacco: Never  Vaping Use   Vaping status: Some Days   Substances: Nicotine  Substance and Sexual Activity   Alcohol use: Yes    Comment: once a week   Drug use: Yes    Types: Marijuana    Comment: history of cocaine use,  been about a year, per patient (12/18/21)   Sexual activity: Not Currently    Birth control/protection: Surgical  Other Topics Concern   Not on file  Social History Narrative   Not on file   Social Drivers of Health   Financial Resource Strain: Medium Risk (08/07/2023)   Overall Financial Resource Strain (CARDIA)    Difficulty of Paying Living Expenses: Somewhat hard  Food Insecurity: Food Insecurity Present (08/07/2023)   Hunger Vital Sign    Worried About Running Out of Food in the Last Year: Sometimes true    Ran Out of Food in the Last Year: Sometimes true  Transportation Needs: No Transportation Needs (08/07/2023)   PRAPARE - Administrator, Civil Service (Medical): No    Lack of Transportation (Non-Medical): No  Physical Activity: Inactive (08/07/2023)   Exercise Vital Sign    Days of Exercise per Week: 0 days    Minutes of Exercise per Session: 0 min  Stress: No Stress Concern Present (08/07/2023)   [employer] of Occupational Health - Occupational Stress Questionnaire    Feeling of Stress : Only a little  Social Connections: Socially Isolated (08/07/2023)   Social Connection and Isolation Panel [NHANES]    Frequency of Communication with Friends and Family: More than three times a week    Frequency of Social Gatherings with Friends and Family: Twice a week    Attends Religious Services: Never    Database administrator or Organizations: No    Attends Banker Meetings: Never    Marital Status: Divorced  Catering manager Violence: Not At Risk (08/07/2023)   Humiliation, Afraid, Rape, and Kick questionnaire    Fear of Current  or Ex-Partner: No    Emotionally Abused: No    Physically Abused: No    Sexually Abused: No    Family History  Problem Relation Age of Onset   Heart disease Mother    Hypertension Mother    Stroke Mother    Mental illness Mother    Heart disease Father    Hypertension Father    Diabetes Father    Colon polyps Brother    Heart disease Maternal Grandfather    Cancer Paternal Grandmother        mouth   Anesthesia problems Daughter    Breast cancer Maternal Aunt    Throat cancer Maternal Uncle    Thyroid disease Paternal Aunt    Hypotension Neg Hx    Malignant hyperthermia Neg Hx    Pseudochol deficiency Neg Hx    Colon cancer Neg Hx    Stomach cancer Neg Hx    Esophageal cancer Neg Hx    Pancreatic cancer Neg Hx    Rectal cancer Neg Hx     Past Surgical History:  Procedure Laterality Date   ABDOMINAL EXPLORATION SURGERY  1977   ABDOMINAL HYSTERECTOMY  1977   "left one of my ovaries"   BACK SURGERY     BIOPSY  05/27/2019   Procedure: BIOPSY;  Surgeon: Corbin Ade, MD;  Location: [facility];  Service: Endoscopy;;   BREAST BIOPSY Left 11/2021   x2   COLONOSCOPY  2011   Dr. Christella Hartigan: Mild diverticulosis, descending diminutive colon polyp (not retrieved), next colonoscopy 10 years   CORONARY ANGIOPLASTY WITH STENT PLACEMENT  2011 X2   "regular stents didn't work; had to go back in in ~ 1 month and put in medicated stents"   DILATION AND CURETTAGE OF UTERUS  ESOPHAGOGASTRODUODENOSCOPY     ESOPHAGOGASTRODUODENOSCOPY (EGD) WITH PROPOFOL N/A 05/27/2019   normal esophagus, dilation, erosive gastropathy s/p biopsy, normal duodenum. Negative H.pylori.    LEFT HEART CATH AND CORONARY ANGIOGRAPHY N/A 03/26/2017   Procedure: LEFT HEART CATH AND CORONARY ANGIOGRAPHY;  Surgeon: Lyn Records, MD;  Location: [facility];  Service: Cardiovascular;  Laterality: N/A;   LUMBAR LAMINECTOMY/DECOMPRESSION MICRODISCECTOMY  08/05/2011   Procedure: LUMBAR  LAMINECTOMY/DECOMPRESSION MICRODISCECTOMY;  Surgeon: Clydene Fake, MD;  Location: [facility];  Service: Neurosurgery;  Laterality: Right;  Right Lumbar four-five extraforaminal discectomy   MALONEY DILATION N/A 05/27/2019   Procedure: Elease Hashimoto DILATION;  Surgeon: Corbin Ade, MD;  Location: [facility];  Service: Endoscopy;  Laterality: N/A;  54   SHOULDER ARTHROSCOPY WITH ROTATOR CUFF REPAIR AND OPEN BICEPS TENODESIS Right 12/24/2021   Procedure: RIGHT SHOULDER ARTHROSCOPY WITH ROTATOR CUFF REPAIR AND BICEPS TENODESIS;  Surgeon: Eldred Manges, MD;  Location: [facility];  Service: Orthopedics;  Laterality: Right;   TONSILLECTOMY     as a child   UPPER GASTROINTESTINAL ENDOSCOPY      ROS: Review of Systems Negative except as stated above  PHYSICAL EXAM: BP (!) 96/58 (BP Location: Left Arm, Patient Position: Sitting, Cuff Size: Normal)   Pulse 72   Ht 4\' 9"  (1.448 m)   Wt 150 lb (68 kg)   SpO2 96%   BMI 32.46 kg/m   Wt Readings from Last 3 Encounters:  08/07/23 150 lb (68 kg)  07/21/23 147 lb (66.7 kg)  07/11/23 147 lb 11.3 oz (67 kg)  Pulse ox of 96% is on 2 L at rest.  Physical Exam  General appearance - alert, well appearing, older Caucasian female and in no distress Mental status - normal mood, behavior, speech, dress, motor activity, and thought processes Neck - supple, no significant adenopathy Chest -breath sounds are moderately decreased with decreased air entry. Heart - normal rate, regular rhythm, normal S1, S2, no murmurs, rubs, clicks or gallops Extremities - peripheral pulses normal, no pedal edema, no clubbing or cyanosis  Results for orders placed or performed in visit on 08/07/23  POCT glycosylated hemoglobin (Hb A1C)   Collection Time: 08/07/23 10:26 AM  Result Value Ref Range   Hemoglobin A1C     HbA1c POC (<> result, manual entry)     HbA1c, POC (prediabetic range)     HbA1c, POC (controlled diabetic range) 6.8 0.0 - 7.0 %  POCT glucose (manual entry)    Collection Time: 08/07/23 10:26 AM  Result Value Ref Range   POC Glucose 119 (A) 70 - 99 mg/dl   *Note: Due to a large number of results and/or encounters for the requested time period, some results have not been displayed. A complete set of results can be found in Results Review.       Latest Ref Rng & Units 07/23/2023    7:04 AM 07/22/2023    6:48 AM 07/21/2023    5:55 PM  CMP  Glucose 70 - 99 mg/dL 95  657  846   BUN 8 - 23 mg/dL 16  15  6    Creatinine 0.44 - 1.00 mg/dL 9.62  9.52  8.41   Sodium 135 - 145 mmol/L 140  135  139   Potassium 3.5 - 5.1 mmol/L 4.0  5.0  3.9   Chloride 98 - 111 mmol/L 100  100  101   CO2 22 - 32 mmol/L 30  22  26    Calcium  8.9 - 10.3 mg/dL 9.3  9.3  9.1   Total Protein 6.5 - 8.1 g/dL  6.5  6.5   Total Bilirubin 0.0 - 1.2 mg/dL  0.4  0.6   Alkaline Phos 38 - 126 U/L  85  87   AST 15 - 41 U/L  18  18   ALT 0 - 44 U/L  19  19    Lipid Panel     Component Value Date/Time   CHOL 192 06/26/2023 1339   TRIG 168 (H) 06/26/2023 1339   HDL 71 06/26/2023 1339   CHOLHDL 2.7 06/26/2023 1339   CHOLHDL 3.8 04/16/2016 0934   VLDL 15 04/16/2016 0934   LDLCALC 93 06/26/2023 1339    CBC    Component Value Date/Time   WBC 18.5 (H) 07/23/2023 0704   RBC 4.01 07/23/2023 0704   HGB 12.9 07/23/2023 0704   HGB 14.8 05/20/2023 1119   HCT 39.0 07/23/2023 0704   HCT 44.2 05/20/2023 1119   PLT 588 (H) 07/23/2023 0704   PLT 373 05/20/2023 1119   MCV 97.3 07/23/2023 0704   MCV 99 (H) 05/20/2023 1119   MCH 32.2 07/23/2023 0704   MCHC 33.1 07/23/2023 0704   RDW 14.6 07/23/2023 0704   RDW 13.2 05/20/2023 1119   LYMPHSABS 0.8 07/13/2023 0513   LYMPHSABS 2.6 04/05/2019 0926   MONOABS 0.9 07/13/2023 0513   EOSABS 0.0 07/13/2023 0513   EOSABS 0.3 04/05/2019 0926   BASOSABS 0.0 07/13/2023 0513   BASOSABS 0.1 04/05/2019 0926    ASSESSMENT AND PLAN: 1. Hospital discharge follow-up (Primary)   2. Chronic obstructive pulmonary disease, unspecified COPD type  (HCC) 3. Chronic hypoxic respiratory failure, on home oxygen therapy (HCC) -Strongly encourage patient to discontinue smoking or at least trying to cut back. Advised of the importance of not smoking around her oxygen tank to prevent an explosion/accident.  She expresses understanding -I do not think she needs to be on both Dulera and Trelegy inhalers.  Would stop the [facility].  She will speak with her pulmonologist Dr. Francine Graven whom she will be seeing this morning also.  We had to cut our visit a little short this morning so she would not miss that appointment. Advise she should increase her oxygen to 3 L during ambulation.  We did not have time today to do a walk test to see how much of an increase she would need with ambulation but this certainly can be done when she sees the pulmonologist today -Advised that we will hold off on clearing her for cataract surgery until her pulmonary status stabilizes hopefully within the next several weeks.  4. Tobacco dependence See #3 above  5. Type 2 diabetes mellitus with hyperglycemia, without long-term current use of insulin (HCC) At goal.  Continue metformin - POCT glycosylated hemoglobin (Hb A1C) - POCT glucose (manual entry)  6. Diabetes mellitus treated with oral medication (HCC)  7.  Lung nodules Due for repeat 28-month follow-up of CAT scan of the chest.  3 new lung nodules were seen on last CAT scan done back in July 2024.  I called spoke with the patient and her daughter about this after they left the office.  They told me that they had already seen Dr. Francine Graven and he plans to refer her to their lung screen program to get it ordered.  Patient was given the opportunity to ask questions.  Patient verbalized understanding of the plan and was able to repeat key elements of the plan.  This documentation was completed using Paediatric nurse.  Any transcriptional errors are unintentional.  Orders Placed This Encounter  Procedures    POCT glycosylated hemoglobin (Hb A1C)   POCT glucose (manual entry)     Requested Prescriptions    No prescriptions requested or ordered in this encounter    No follow-ups on file.  Jonah Blue, MD, FACP

## 2023-08-07 NOTE — Progress Notes (Signed)
Synopsis: Referred in January 2025 for hospital follow up for COPD exacerbation  Subjective:   PATIENT ID: [name] [name] GENDER: female DOB: 08/23/1955, MRN: [id number]   HPI  Chief Complaint  Patient presents with   Hospitalization Follow-up   [name] [name] is a 68 year old woman, daily smoker with history of COPD, DMII, GERD, and CAD who is referred to pulmonary clinic for hospital follow up of COPD exacerbation.  She was admitted 07/12/23 to 07/15/23 and 07/21/2023 to1/02/2024 for COPD exacerbations. She was seen by the inpatient pulmonary team during the December hospitalization. She was parainfluenza positive 06/14/23 and rhinovirus positive 07/23/23.   She  presents with increased mucus production described as "thick and yucky." The patient reports difficulty in expectorating the mucus, which is yellow in color. The patient also mentions an increase in wheezing and the need to increase her oxygen supplementation from 2 to 3 liters when moving around, as her oxygen saturation drops to the low 70s. The patient's caregiver confirms this, noting that the patient's oxygen saturation improves to above 88% with the increased oxygen flow.  The patient continues to smoke about a pack of cigarettes per day, despite previous attempts to quit using Chantix and nicotine patches. The patient acknowledges the need to quit but expresses significant anxiety about the prospect, describing a strong craving for cigarettes and a sense of loss without them. The patient also mentions a history of mood disorders and is currently on clonazepam and an unspecified mood medication.  The patient is currently on Trelegy for COPD management.. The patient also uses Ventolin and a nebulizer with DuoNeb, though the frequency of use is inconsistent. The patient's caregiver is working on setting up reminders for the patient to use the nebulizer at least twice a day.  The patient also mentions a recent fall, but no further  details or related symptoms are provided. The patient lives alone in a condo and spends most of the day watching TV, with smoking breaks providing most of her physical activity. The patient's caregiver is involved and supportive, helping with medication reminders and attending medical appointments.  The patient has a history of lung cancer and is due for a follow-up scan, which was last done about six months ago. The patient also mentions a history of prednisone use, but is not currently on it. The patient expresses concern about her memory, which she feels is worsening.    Past Medical History:  Diagnosis Date   Allergy    seasonal   Anemia    Anxiety    takes Lexapro daily   Arthritis    "back, from neck down pass my bra area" (03/25/2017)   Asthma    Bartholin gland cyst 08/29/2011   Bruises easily    pt is on Effient   Chronic back pain    herniated nucleus pulposus   Chronic back pain    "neck to bra area; lower back" (03/25/2017)   Chronic kidney disease    recurrent UTI's this year 2022   COPD (chronic obstructive pulmonary disease) (HCC)    early stages   Coronary artery disease    Depression    takes Klonopin daily   Diabetes mellitus without complication (HCC)    Diverticulosis    Fibroadenoma of left breast    GERD (gastroesophageal reflux disease)    takes Nexium daily   H/O hiatal hernia    Heart attack (HCC) 2011   Hemorrhoids    Hernia    Hyperlipidemia  takes Lipitor daily   Hypertension    takes Losartan daily and Labetalol bid   Hypothyroidism    takes Synthroid daily   Insomnia    hydroxyzine prn   Joint pain    Pneumonia    "couple times" (03/25/2017)   Pre-diabetes    "just found out 1 wk ago" (03/25/2017)   Psoriasis    elbows,knees,back   Shortness of breath    with exertion   Slowing of urinary stream    Stress incontinence      Family History  Problem Relation Age of Onset   Heart disease Mother    Hypertension Mother    Stroke  Mother    Mental illness Mother    Heart disease Father    Hypertension Father    Diabetes Father    Colon polyps Brother    Heart disease Maternal Grandfather    Cancer Paternal Grandmother        mouth   Anesthesia problems Daughter    Breast cancer Maternal Aunt    Throat cancer Maternal Uncle    Thyroid disease Paternal Aunt    Hypotension Neg Hx    Malignant hyperthermia Neg Hx    Pseudochol deficiency Neg Hx    Colon cancer Neg Hx    Stomach cancer Neg Hx    Esophageal cancer Neg Hx    Pancreatic cancer Neg Hx    Rectal cancer Neg Hx      Social History   Socioeconomic History   Marital status: Divorced    Spouse name: Not on file   Number of children: 1   Years of education: Not on file   Highest education level: 9th grade  Occupational History   Not on file  Tobacco Use   Smoking status: Every Day    Current packs/day: 0.50    Average packs/day: 0.5 packs/day for 50.0 years (25.0 ttl pk-yrs)    Types: Cigarettes   Smokeless tobacco: Never  Vaping Use   Vaping status: Some Days   Substances: Nicotine  Substance and Sexual Activity   Alcohol use: Yes    Comment: once a week   Drug use: Yes    Types: Marijuana    Comment: history of cocaine use, been about a year, per patient (12/18/21)   Sexual activity: Not Currently    Birth control/protection: Surgical  Other Topics Concern   Not on file  Social History Narrative   Not on file   Social Drivers of Health   Financial Resource Strain: Medium Risk (08/07/2023)   Overall Financial Resource Strain (CARDIA)    Difficulty of Paying Living Expenses: Somewhat hard  Food Insecurity: Food Insecurity Present (08/07/2023)   Hunger Vital Sign    Worried About Running Out of Food in the Last Year: Sometimes true    Ran Out of Food in the Last Year: Sometimes true  Transportation Needs: No Transportation Needs (08/07/2023)   PRAPARE - Administrator, Civil Service (Medical): No    Lack of  Transportation (Non-Medical): No  Physical Activity: Inactive (08/07/2023)   Exercise Vital Sign    Days of Exercise per Week: 0 days    Minutes of Exercise per Session: 0 min  Stress: No Stress Concern Present (08/07/2023)   [employer] of Occupational Health - Occupational Stress Questionnaire    Feeling of Stress : Only a little  Social Connections: Socially Isolated (08/07/2023)   Social Connection and Isolation Panel [NHANES]    Frequency of Communication  with Friends and Family: More than three times a week    Frequency of Social Gatherings with Friends and Family: Twice a week    Attends Religious Services: Never    Database administrator or Organizations: No    Attends Banker Meetings: Never    Marital Status: Divorced  Catering manager Violence: Not At Risk (08/07/2023)   Humiliation, Afraid, Rape, and Kick questionnaire    Fear of Current or Ex-Partner: No    Emotionally Abused: No    Physically Abused: No    Sexually Abused: No     Allergies  Allergen Reactions   Avocado Anaphylaxis   Latex Shortness Of Breath and Rash   Codeine Nausea Only     Outpatient Medications Prior to Visit  Medication Sig Dispense Refill   albuterol (VENTOLIN HFA) 108 (90 Base) MCG/ACT inhaler INHALE 2 PUFFS INTO THE LUNGS EVERY 6 HOURS AS NEEDED FOR WHEEZING OR SHORTNESS OF BREATH 54 g 0   alendronate (FOSAMAX) 35 MG tablet Take 1 tablet (35 mg total) by mouth every 7 (seven) days. Take with a full glass of water on an empty stomach. Sit upright for 30-60 mins after taking (Patient taking differently: Take 35 mg by mouth every Wednesday.) 12 tablet 1   amLODipine (NORVASC) 5 MG tablet Take 1 tablet (5 mg total) by mouth daily. (Patient taking differently: Take 5 mg by mouth in the morning.) 90 tablet 3   aspirin EC 81 MG tablet Take 81 mg by mouth in the morning.     atorvastatin (LIPITOR) 80 MG tablet Take 1 tablet (80 mg total) by mouth daily, please schedule appointment for  more refills 90 tablet 3   Blood Glucose Monitoring Suppl (TRUE METRIX METER) DEVI 1 kit by Does not apply route 4 (four) times daily. 1 Device 0   busPIRone (BUSPAR) 15 MG tablet Take 2 tablets (30 mg total) by mouth in the morning AND 1 tablet (15 mg total) daily at 12 noon AND 1 tablet (15 mg total) every evening. (Patient taking differently: Take 2 tablets (30 mg total) by mouth in the morning AND 1 tablet (15 mg total) daily at 12 noon AND 1 tablet (15 mg total) every evening. (Patient takes 4 tabs every night at bedtime)) 120 tablet 3   clonazePAM (KLONOPIN) 0.5 MG disintegrating tablet Take 1 tablet (0.5 mg total) by mouth 2 (two) times daily. 60 tablet 0   doxepin (SINEQUAN) 75 MG capsule Take 1 capsule (75 mg total) by mouth at bedtime. 30 capsule 3   escitalopram (LEXAPRO) 20 MG tablet Take 1 tablet (20 mg total) by mouth daily. (Patient taking differently: Take 20 mg by mouth in the morning.) 30 tablet 3   ezetimibe (ZETIA) 10 MG tablet Take 1 tablet (10 mg total) by mouth daily. (Patient taking differently: Take 10 mg by mouth at bedtime.) 90 tablet 3   furosemide (LASIX) 20 MG tablet Take 1 tablet (20 mg total) by mouth daily. (Patient taking differently: Take 20 mg by mouth in the morning.) 90 tablet 3   gabapentin (NEURONTIN) 600 MG tablet TAKE 1 TABLET BY MOUTH 4 TIMES DAILY (Patient taking differently: Take 600 mg by mouth 2 (two) times daily.) 360 tablet 2   glucose blood (TRUE METRIX BLOOD GLUCOSE TEST) test strip Use as instructed 100 each 12   ipratropium-albuterol (DUONEB) 0.5-2.5 (3) MG/3ML SOLN Take 3 mLs by nebulization every 6 (six) hours as needed. 360 mL 0   levothyroxine (SYNTHROID) 112  MCG tablet Take 1 tablet (112 mcg total) by mouth daily. (Patient taking differently: Take 112 mcg by mouth in the morning.) 90 tablet 2   losartan (COZAAR) 50 MG tablet Take 1.5 tablets (75 mg total) by mouth daily. (Patient taking differently: Take 75 mg by mouth in the morning.) 135 tablet  3   metFORMIN (GLUCOPHAGE) 500 MG tablet Take 1 tablet (500 mg total) by mouth 2 (two) times daily with a meal. (Patient taking differently: Take 500 mg by mouth in the morning.) 180 tablet 1   mirtazapine (REMERON) 45 MG tablet Take 1 tablet (45 mg total) by mouth at bedtime. 30 tablet 3   montelukast (SINGULAIR) 10 MG tablet Take 1 tablet (10 mg total) by mouth at bedtime. 90 tablet 2   nebivolol (BYSTOLIC) 10 MG tablet Take 1 tablet (10 mg total) by mouth daily. (Patient taking differently: Take 10 mg by mouth at bedtime.) 90 tablet 3   omeprazole (PRILOSEC) 40 MG capsule Take 1 capsule (40 mg total) by mouth 2 (two) times daily before a meal. 60 capsule 5   Spacer/Aero-Holding Chambers DEVI Use with the albuterol inhaler. 1 each 0   ticagrelor (BRILINTA) 60 MG TABS tablet Take 1 tablet (60 mg total) by mouth 2 (two) times daily. 60 tablet 52   triamcinolone (KENALOG) 0.025 % ointment Apply 1 Application topically 2 (two) times daily as needed. 80 g 0   TRUEPLUS LANCETS 28G MISC 28 g by Does not apply route QID. 120 each 2   Fluticasone-Umeclidin-Vilant (TRELEGY ELLIPTA) 100-62.5-25 MCG/ACT AEPB Inhale 1 puff into the lungs daily. 60 each 10   mometasone-formoterol (DULERA) 100-5 MCG/ACT AERO Inhale 2 puffs into the lungs daily as needed for wheezing or shortness of breath.     predniSONE (DELTASONE) 20 MG tablet Take 2 tablets (40 mg total) by mouth daily with breakfast. 6 tablet 0   nitroGLYCERIN (NITROSTAT) 0.4 MG SL tablet Place 1 tablet (0.4 mg total) under the tongue every 5 (five) minutes as needed for chest pain. (Patient not taking: Reported on 07/21/2023) 25 tablet 3   varenicline (CHANTIX) 0.5 MG tablet Initial, 0.5 mg orally once daily for 3 days, then 0.5 mg twice daily on days 4 through 7. Then start taking the 1 mg dose twice daily for a total of 12 weeks. (Patient not taking: Reported on 08/07/2023) 11 tablet 0   No facility-administered medications prior to visit.    Review of  Systems  Constitutional:  Negative for chills, fever, malaise/fatigue and weight loss.  HENT:  Negative for congestion, sinus pain and sore throat.   Eyes: Negative.   Respiratory:  Positive for cough, sputum production, shortness of breath and wheezing. Negative for hemoptysis.   Cardiovascular:  Negative for chest pain, palpitations, orthopnea, claudication and leg swelling.  Gastrointestinal:  Negative for abdominal pain, heartburn, nausea and vomiting.  Genitourinary: Negative.   Musculoskeletal:  Negative for joint pain and myalgias.  Skin:  Negative for rash.  Neurological:  Negative for weakness.  Endo/Heme/Allergies: Negative.   Psychiatric/Behavioral:  The patient is nervous/anxious.     Objective:   Vitals:   08/07/23 1110  BP: 108/64  Pulse: 78  Temp: (!) 97.2 F (36.2 C)  TempSrc: Temporal  SpO2: 94%  Weight: 153 lb (69.4 kg)  Height: 4\' 11"  (1.499 m)   Physical Exam Constitutional:      General: She is not in acute distress.    Appearance: Normal appearance.  Eyes:     General: No  scleral icterus.    Conjunctiva/sclera: Conjunctivae normal.  Cardiovascular:     Rate and Rhythm: Normal rate and regular rhythm.  Pulmonary:     Breath sounds: Wheezing present. No rhonchi or rales.  Musculoskeletal:     Right lower leg: No edema.     Left lower leg: No edema.  Skin:    General: Skin is warm and dry.  Neurological:     General: No focal deficit present.    CBC    Component Value Date/Time   WBC 18.5 (H) 07/23/2023 0704   RBC 4.01 07/23/2023 0704   HGB 12.9 07/23/2023 0704   HGB 14.8 05/20/2023 1119   HCT 39.0 07/23/2023 0704   HCT 44.2 05/20/2023 1119   PLT 588 (H) 07/23/2023 0704   PLT 373 05/20/2023 1119   MCV 97.3 07/23/2023 0704   MCV 99 (H) 05/20/2023 1119   MCH 32.2 07/23/2023 0704   MCHC 33.1 07/23/2023 0704   RDW 14.6 07/23/2023 0704   RDW 13.2 05/20/2023 1119   LYMPHSABS 0.8 07/13/2023 0513   LYMPHSABS 2.6 04/05/2019 0926   MONOABS  0.9 07/13/2023 0513   EOSABS 0.0 07/13/2023 0513   EOSABS 0.3 04/05/2019 0926   BASOSABS 0.0 07/13/2023 0513   BASOSABS 0.1 04/05/2019 0926      Latest Ref Rng & Units 07/23/2023    7:04 AM 07/22/2023    6:48 AM 07/21/2023    5:55 PM  BMP  Glucose 70 - 99 mg/dL 95  960  454   BUN 8 - 23 mg/dL 16  15  6    Creatinine 0.44 - 1.00 mg/dL 0.98  1.19  1.47   Sodium 135 - 145 mmol/L 140  135  139   Potassium 3.5 - 5.1 mmol/L 4.0  5.0  3.9   Chloride 98 - 111 mmol/L 100  100  101   CO2 22 - 32 mmol/L 30  22  26    Calcium 8.9 - 10.3 mg/dL 9.3  9.3  9.1     Chest imaging: CXR 07/21/23 Cardiac shadow is within normal limits. Aortic calcifications are seen. The lungs are well aerated bilaterally. No focal infiltrate or sizable effusion is noted. No acute bony abnormality is noted. Old healed clavicular fracture is noted.  LCS CT Chest 02/07/23 1. Lung-RADS 3, probably benign findings. Short-term follow-up in 6 months is recommended with repeat low-dose chest CT without contrast (please use the following order, "CT CHEST LCS NODULE FOLLOW-UP W/O CM"). Three new scattered solid pulmonary nodules, largest 5.0 mm in the posterior left upper lobe. 2. Three-vessel coronary atherosclerosis. 3. Aortic Atherosclerosis (ICD10-I70.0) and Emphysema (ICD10-J43.9).  PFT:     No data to display          Labs:  Path:  Echo:  Heart Catheterization:     Assessment & Plan:   COPD exacerbation (HCC) - Plan: predniSONE (DELTASONE) 10 MG tablet, azithromycin (ZITHROMAX) 250 MG tablet, Fluticasone-Umeclidin-Vilant (TRELEGY ELLIPTA) 200-62.5-25 MCG/ACT AEPB, Ambulatory Referral for DME  Cigarette smoker  Chronic hypoxemic respiratory failure (HCC)  Discussion: [name] [name] is a 68 year old woman, daily smoker with history of COPD, DMII, GERD, and CAD who is referred to pulmonary clinic for hospital follow up of COPD exacerbation.  Chronic Obstructive Pulmonary Disease (COPD) Exacerbation with  increased sputum production and difficulty clearing secretions. Current smoker with difficulty quitting despite previous attempts with Chantix and patches. Currently on Trelegy and Ventolin with occasional use of nebulizer. -Increase Trelegy to higher dose. -Start prednisone taper and  Z-Pak antibiotic for COPD exacerbation. -Recommend nicotine patches (21mg  daily) and nicotine lozenges (2-4mg  as needed) for smoking cessation. -Consider daily low-dose prednisone or other injectable medications if smoking cessation is achieved. -Check lung function and consider sleep apnea testing. -Order lung cancer screening. -Follow-up in 3 months.  Oxygen Requirement Increased oxygen requirement to 3L during activity with desaturation to 72%. Currently using home oxygen concentrator. -Consider qualification for portable oxygen concentrator (POC). -Perform walking test to assess oxygen requirement.  Anxiety Reports increased anxiety, particularly related to smoking cessation. Currently on clonazepam. -Continue clonazepam, consider adjusting dose as needed. -Encourage continued engagement with mental health provider.  45 minutes spent on this visit.  Melody Comas, MD [facility] Office: [phone]   Current Outpatient Medications:    albuterol (VENTOLIN HFA) 108 (90 Base) MCG/ACT inhaler, INHALE 2 PUFFS INTO THE LUNGS EVERY 6 HOURS AS NEEDED FOR WHEEZING OR SHORTNESS OF BREATH, Disp: 54 g, Rfl: 0   alendronate (FOSAMAX) 35 MG tablet, Take 1 tablet (35 mg total) by mouth every 7 (seven) days. Take with a full glass of water on an empty stomach. Sit upright for 30-60 mins after taking (Patient taking differently: Take 35 mg by mouth every Wednesday.), Disp: 12 tablet, Rfl: 1   amLODipine (NORVASC) 5 MG tablet, Take 1 tablet (5 mg total) by mouth daily. (Patient taking differently: Take 5 mg by mouth in the morning.), Disp: 90 tablet, Rfl: 3   aspirin EC 81 MG tablet, Take 81 mg  by mouth in the morning., Disp: , Rfl:    atorvastatin (LIPITOR) 80 MG tablet, Take 1 tablet (80 mg total) by mouth daily, please schedule appointment for more refills, Disp: 90 tablet, Rfl: 3   azithromycin (ZITHROMAX) 250 MG tablet, Take 2 tablets (500 mg total) by mouth daily for 1 day, THEN 1 tablet (250 mg total) daily for 4 days., Disp: 6 tablet, Rfl: 0   Blood Glucose Monitoring Suppl (TRUE METRIX METER) DEVI, 1 kit by Does not apply route 4 (four) times daily., Disp: 1 Device, Rfl: 0   busPIRone (BUSPAR) 15 MG tablet, Take 2 tablets (30 mg total) by mouth in the morning AND 1 tablet (15 mg total) daily at 12 noon AND 1 tablet (15 mg total) every evening. (Patient taking differently: Take 2 tablets (30 mg total) by mouth in the morning AND 1 tablet (15 mg total) daily at 12 noon AND 1 tablet (15 mg total) every evening. (Patient takes 4 tabs every night at bedtime)), Disp: 120 tablet, Rfl: 3   clonazePAM (KLONOPIN) 0.5 MG disintegrating tablet, Take 1 tablet (0.5 mg total) by mouth 2 (two) times daily., Disp: 60 tablet, Rfl: 0   doxepin (SINEQUAN) 75 MG capsule, Take 1 capsule (75 mg total) by mouth at bedtime., Disp: 30 capsule, Rfl: 3   escitalopram (LEXAPRO) 20 MG tablet, Take 1 tablet (20 mg total) by mouth daily. (Patient taking differently: Take 20 mg by mouth in the morning.), Disp: 30 tablet, Rfl: 3   ezetimibe (ZETIA) 10 MG tablet, Take 1 tablet (10 mg total) by mouth daily. (Patient taking differently: Take 10 mg by mouth at bedtime.), Disp: 90 tablet, Rfl: 3   Fluticasone-Umeclidin-Vilant (TRELEGY ELLIPTA) 200-62.5-25 MCG/ACT AEPB, Inhale 1 puff into the lungs daily., Disp: 60 each, Rfl: 11   furosemide (LASIX) 20 MG tablet, Take 1 tablet (20 mg total) by mouth daily. (Patient taking differently: Take 20 mg by mouth in the morning.), Disp: 90 tablet, Rfl: 3  gabapentin (NEURONTIN) 600 MG tablet, TAKE 1 TABLET BY MOUTH 4 TIMES DAILY (Patient taking differently: Take 600 mg by mouth 2  (two) times daily.), Disp: 360 tablet, Rfl: 2   glucose blood (TRUE METRIX BLOOD GLUCOSE TEST) test strip, Use as instructed, Disp: 100 each, Rfl: 12   ipratropium-albuterol (DUONEB) 0.5-2.5 (3) MG/3ML SOLN, Take 3 mLs by nebulization every 6 (six) hours as needed., Disp: 360 mL, Rfl: 0   levothyroxine (SYNTHROID) 112 MCG tablet, Take 1 tablet (112 mcg total) by mouth daily. (Patient taking differently: Take 112 mcg by mouth in the morning.), Disp: 90 tablet, Rfl: 2   losartan (COZAAR) 50 MG tablet, Take 1.5 tablets (75 mg total) by mouth daily. (Patient taking differently: Take 75 mg by mouth in the morning.), Disp: 135 tablet, Rfl: 3   metFORMIN (GLUCOPHAGE) 500 MG tablet, Take 1 tablet (500 mg total) by mouth 2 (two) times daily with a meal. (Patient taking differently: Take 500 mg by mouth in the morning.), Disp: 180 tablet, Rfl: 1   mirtazapine (REMERON) 45 MG tablet, Take 1 tablet (45 mg total) by mouth at bedtime., Disp: 30 tablet, Rfl: 3   montelukast (SINGULAIR) 10 MG tablet, Take 1 tablet (10 mg total) by mouth at bedtime., Disp: 90 tablet, Rfl: 2   nebivolol (BYSTOLIC) 10 MG tablet, Take 1 tablet (10 mg total) by mouth daily. (Patient taking differently: Take 10 mg by mouth at bedtime.), Disp: 90 tablet, Rfl: 3   omeprazole (PRILOSEC) 40 MG capsule, Take 1 capsule (40 mg total) by mouth 2 (two) times daily before a meal., Disp: 60 capsule, Rfl: 5   predniSONE (DELTASONE) 10 MG tablet, Take 4 tablets (40 mg total) by mouth daily with breakfast for 3 days, THEN 3 tablets (30 mg total) daily with breakfast for 3 days, THEN 2 tablets (20 mg total) daily with breakfast for 3 days, THEN 1 tablet (10 mg total) daily with breakfast for 3 days., Disp: 30 tablet, Rfl: 0   Spacer/Aero-Holding Chambers DEVI, Use with the albuterol inhaler., Disp: 1 each, Rfl: 0   ticagrelor (BRILINTA) 60 MG TABS tablet, Take 1 tablet (60 mg total) by mouth 2 (two) times daily., Disp: 60 tablet, Rfl: 52   triamcinolone  (KENALOG) 0.025 % ointment, Apply 1 Application topically 2 (two) times daily as needed., Disp: 80 g, Rfl: 0   TRUEPLUS LANCETS 28G MISC, 28 g by Does not apply route QID., Disp: 120 each, Rfl: 2   nitroGLYCERIN (NITROSTAT) 0.4 MG SL tablet, Place 1 tablet (0.4 mg total) under the tongue every 5 (five) minutes as needed for chest pain. (Patient not taking: Reported on 07/21/2023), Disp: 25 tablet, Rfl: 3

## 2023-08-08 ENCOUNTER — Telehealth: Payer: Self-pay | Admitting: Pulmonary Disease

## 2023-08-08 ENCOUNTER — Other Ambulatory Visit: Payer: Self-pay

## 2023-08-08 ENCOUNTER — Encounter: Payer: Self-pay | Admitting: Pulmonary Disease

## 2023-08-08 ENCOUNTER — Encounter: Payer: Self-pay | Admitting: Pharmacist

## 2023-08-08 DIAGNOSIS — J9611 Chronic respiratory failure with hypoxia: Secondary | ICD-10-CM

## 2023-08-08 NOTE — Telephone Encounter (Signed)
Send the order for the POC to another DME company that have one that goes to [employer].

## 2023-08-10 NOTE — Telephone Encounter (Signed)
This can't be sent to another DME company due to the insurance that has to go to [facility]

## 2023-08-11 ENCOUNTER — Other Ambulatory Visit (HOSPITAL_COMMUNITY): Payer: Self-pay

## 2023-08-11 NOTE — Telephone Encounter (Signed)
Dr Francine Graven- are you okay with prescribing continuous flow o2 for this pt? The POC we ordered 5lpm pulsed they are saying they can not accommodate anything above 4 lpm pulsed  Insurance unfortunately requires them to use [employer] as DME

## 2023-08-11 NOTE — Telephone Encounter (Signed)
Yes, ok to prescribe continuous flow O2 if unable to get POC that will go up to 5lpm pulsed.  Thanks, JD

## 2023-08-12 ENCOUNTER — Telehealth: Payer: Self-pay | Admitting: Pulmonary Disease

## 2023-08-12 DIAGNOSIS — J9601 Acute respiratory failure with hypoxia: Secondary | ICD-10-CM | POA: Diagnosis not present

## 2023-08-12 DIAGNOSIS — I2510 Atherosclerotic heart disease of native coronary artery without angina pectoris: Secondary | ICD-10-CM | POA: Diagnosis not present

## 2023-08-12 DIAGNOSIS — J449 Chronic obstructive pulmonary disease, unspecified: Secondary | ICD-10-CM | POA: Diagnosis not present

## 2023-08-12 DIAGNOSIS — I5032 Chronic diastolic (congestive) heart failure: Secondary | ICD-10-CM | POA: Diagnosis not present

## 2023-08-12 DIAGNOSIS — F419 Anxiety disorder, unspecified: Secondary | ICD-10-CM | POA: Diagnosis not present

## 2023-08-12 DIAGNOSIS — F17210 Nicotine dependence, cigarettes, uncomplicated: Secondary | ICD-10-CM | POA: Diagnosis not present

## 2023-08-12 DIAGNOSIS — J9811 Atelectasis: Secondary | ICD-10-CM | POA: Diagnosis not present

## 2023-08-12 DIAGNOSIS — I110 Hypertensive heart disease with heart failure: Secondary | ICD-10-CM | POA: Diagnosis not present

## 2023-08-12 DIAGNOSIS — F32A Depression, unspecified: Secondary | ICD-10-CM | POA: Diagnosis not present

## 2023-08-12 NOTE — Telephone Encounter (Signed)
New referral placed for continuous o2

## 2023-08-12 NOTE — Telephone Encounter (Signed)
Yes that is ok

## 2023-08-12 NOTE — Telephone Encounter (Signed)
Council Mechanic Tomie China; Angus Seller, [place] Received, We do not carry continuous POC. I can offer her to swap out the home fill for just tanks. Is that okay?       Previous Messages    ----- Message ----- From: Iris Pert Sent: 08/12/2023  10:20 AM EST To: Alain Honey; Kathe Becton; * Subject: O2                                            1956/02/03  Order placed please advise. Thanks

## 2023-08-13 ENCOUNTER — Other Ambulatory Visit: Payer: Self-pay

## 2023-08-14 DIAGNOSIS — J449 Chronic obstructive pulmonary disease, unspecified: Secondary | ICD-10-CM | POA: Diagnosis not present

## 2023-08-14 DIAGNOSIS — F17210 Nicotine dependence, cigarettes, uncomplicated: Secondary | ICD-10-CM | POA: Diagnosis not present

## 2023-08-14 DIAGNOSIS — I5032 Chronic diastolic (congestive) heart failure: Secondary | ICD-10-CM | POA: Diagnosis not present

## 2023-08-14 DIAGNOSIS — I110 Hypertensive heart disease with heart failure: Secondary | ICD-10-CM | POA: Diagnosis not present

## 2023-08-14 DIAGNOSIS — F419 Anxiety disorder, unspecified: Secondary | ICD-10-CM | POA: Diagnosis not present

## 2023-08-14 DIAGNOSIS — I2510 Atherosclerotic heart disease of native coronary artery without angina pectoris: Secondary | ICD-10-CM | POA: Diagnosis not present

## 2023-08-14 DIAGNOSIS — J9601 Acute respiratory failure with hypoxia: Secondary | ICD-10-CM | POA: Diagnosis not present

## 2023-08-14 DIAGNOSIS — J9811 Atelectasis: Secondary | ICD-10-CM | POA: Diagnosis not present

## 2023-08-14 DIAGNOSIS — F32A Depression, unspecified: Secondary | ICD-10-CM | POA: Diagnosis not present

## 2023-08-15 ENCOUNTER — Other Ambulatory Visit: Payer: Self-pay

## 2023-08-15 ENCOUNTER — Other Ambulatory Visit (HOSPITAL_COMMUNITY): Payer: Self-pay

## 2023-08-16 ENCOUNTER — Encounter: Payer: Self-pay | Admitting: Hematology

## 2023-08-18 ENCOUNTER — Other Ambulatory Visit (HOSPITAL_COMMUNITY): Payer: Self-pay

## 2023-08-18 ENCOUNTER — Other Ambulatory Visit: Payer: Self-pay | Admitting: Pulmonary Disease

## 2023-08-18 ENCOUNTER — Other Ambulatory Visit: Payer: Self-pay

## 2023-08-18 ENCOUNTER — Encounter (HOSPITAL_COMMUNITY): Payer: Self-pay

## 2023-08-18 DIAGNOSIS — J441 Chronic obstructive pulmonary disease with (acute) exacerbation: Secondary | ICD-10-CM

## 2023-08-18 DIAGNOSIS — I110 Hypertensive heart disease with heart failure: Secondary | ICD-10-CM | POA: Diagnosis not present

## 2023-08-18 DIAGNOSIS — I5032 Chronic diastolic (congestive) heart failure: Secondary | ICD-10-CM | POA: Diagnosis not present

## 2023-08-18 DIAGNOSIS — J9601 Acute respiratory failure with hypoxia: Secondary | ICD-10-CM | POA: Diagnosis not present

## 2023-08-18 DIAGNOSIS — F17210 Nicotine dependence, cigarettes, uncomplicated: Secondary | ICD-10-CM | POA: Diagnosis not present

## 2023-08-18 DIAGNOSIS — J9811 Atelectasis: Secondary | ICD-10-CM | POA: Diagnosis not present

## 2023-08-18 DIAGNOSIS — I2510 Atherosclerotic heart disease of native coronary artery without angina pectoris: Secondary | ICD-10-CM | POA: Diagnosis not present

## 2023-08-18 DIAGNOSIS — F419 Anxiety disorder, unspecified: Secondary | ICD-10-CM | POA: Diagnosis not present

## 2023-08-18 DIAGNOSIS — J449 Chronic obstructive pulmonary disease, unspecified: Secondary | ICD-10-CM | POA: Diagnosis not present

## 2023-08-18 DIAGNOSIS — F32A Depression, unspecified: Secondary | ICD-10-CM | POA: Diagnosis not present

## 2023-08-19 ENCOUNTER — Other Ambulatory Visit: Payer: Self-pay

## 2023-08-19 ENCOUNTER — Telehealth: Payer: Self-pay | Admitting: Pulmonary Disease

## 2023-08-19 ENCOUNTER — Telehealth: Payer: Self-pay

## 2023-08-19 ENCOUNTER — Other Ambulatory Visit (HOSPITAL_COMMUNITY): Payer: Self-pay

## 2023-08-19 DIAGNOSIS — J9601 Acute respiratory failure with hypoxia: Secondary | ICD-10-CM | POA: Diagnosis not present

## 2023-08-19 DIAGNOSIS — F17210 Nicotine dependence, cigarettes, uncomplicated: Secondary | ICD-10-CM | POA: Diagnosis not present

## 2023-08-19 DIAGNOSIS — J441 Chronic obstructive pulmonary disease with (acute) exacerbation: Secondary | ICD-10-CM

## 2023-08-19 DIAGNOSIS — F32A Depression, unspecified: Secondary | ICD-10-CM | POA: Diagnosis not present

## 2023-08-19 DIAGNOSIS — I110 Hypertensive heart disease with heart failure: Secondary | ICD-10-CM | POA: Diagnosis not present

## 2023-08-19 DIAGNOSIS — J9811 Atelectasis: Secondary | ICD-10-CM | POA: Diagnosis not present

## 2023-08-19 DIAGNOSIS — J449 Chronic obstructive pulmonary disease, unspecified: Secondary | ICD-10-CM | POA: Diagnosis not present

## 2023-08-19 DIAGNOSIS — F419 Anxiety disorder, unspecified: Secondary | ICD-10-CM | POA: Diagnosis not present

## 2023-08-19 DIAGNOSIS — I2510 Atherosclerotic heart disease of native coronary artery without angina pectoris: Secondary | ICD-10-CM | POA: Diagnosis not present

## 2023-08-19 DIAGNOSIS — I5032 Chronic diastolic (congestive) heart failure: Secondary | ICD-10-CM | POA: Diagnosis not present

## 2023-08-19 MED ORDER — PREDNISONE 10 MG PO TABS
ORAL_TABLET | ORAL | 0 refills | Status: DC
  Filled 2023-08-19 (×3): qty 30, 12d supply, fill #0

## 2023-08-19 NOTE — Telephone Encounter (Signed)
 Copied from CRM [id number]. Topic: General - Other >> Aug 19, 2023  8:49 AM Yvone Marda Blow wrote: Home Health Verbal Orders - Caller/Agency: Beth with [facility] Number: [phone] Service Requested:  Copd Teaching Frequency: 1w4 and every 2wks for 4 weeks  Any new concerns about the patient? Yes  increased short of breath, coughing, cramping in hands and feet. She has missed taking meds.

## 2023-08-19 NOTE — Telephone Encounter (Signed)
Second prednisone taper sent to pharmacy.   Patient really needs to quit smoking in order to recover from her recent viral infection. If no improvement after this steroid taper then recommend going to the ER.  [name] [name]

## 2023-08-19 NOTE — Telephone Encounter (Signed)
 Spoke to [employer] with [facility]. Beth stated that pt was prescribed prednisone  and zpak during 08/07/2023 appt with Dr. Kara. Pt missed a few doses of prednisone  but completed zpak.  Pt sx have worsened over the past week. C/o chest congestion, headache, increased SOB with exertion and wheezing x2w Pt wears 2L cont. Daughter increased oxygen  to 2.5L. spo2 maintaining in mid to upper 90's.  Denied f/c/s or additional sx. Pt is using duoneb Q6H and trelegy once daily.    Dr. Kara, please advise. thanks

## 2023-08-19 NOTE — Telephone Encounter (Signed)
Spoke [name] [name] with [employer] for  COPD Teaching Frequency: 1w4 and every 2wks for 4 weeks  Any new concerns about the patient? Yes  increased short of breath, coughing, cramping in hands and feet. She has missed taking meds.

## 2023-08-19 NOTE — Telephone Encounter (Signed)
Beth states patient still having symptoms of cough and shortness of breath, Also having cramping in her hands and feet. Beth phone number is [phone].

## 2023-08-20 ENCOUNTER — Telehealth: Payer: Self-pay | Admitting: Pulmonary Disease

## 2023-08-20 ENCOUNTER — Ambulatory Visit (HOSPITAL_COMMUNITY)
Admission: RE | Admit: 2023-08-20 | Discharge: 2023-08-20 | Disposition: A | Discharge status: Home / Self Care | Payer: Medicare HMO | Source: Ambulatory Visit | Attending: Cardiovascular Disease | Admitting: Cardiovascular Disease

## 2023-08-20 ENCOUNTER — Other Ambulatory Visit: Payer: Self-pay

## 2023-08-20 DIAGNOSIS — I10 Essential (primary) hypertension: Secondary | ICD-10-CM | POA: Diagnosis not present

## 2023-08-20 DIAGNOSIS — F17200 Nicotine dependence, unspecified, uncomplicated: Secondary | ICD-10-CM | POA: Diagnosis not present

## 2023-08-20 DIAGNOSIS — I2510 Atherosclerotic heart disease of native coronary artery without angina pectoris: Secondary | ICD-10-CM | POA: Diagnosis not present

## 2023-08-20 LAB — NM PET CT CARDIAC PERFUSION MULTI W/ABSOLUTE BLOODFLOW
LV dias vol: 69 mL (ref 46–106)
LV sys vol: 22 mL
MBFR: 2.26
Nuc Rest EF: 68 %
Nuc Stress EF: 75 %
Rest MBF: 0.76 ml/g/min
Rest Nuclear Isotope Dose: 18 mCi
ST Depression (mm): 0 mm
Stress MBF: 1.72 ml/g/min
Stress Nuclear Isotope Dose: 18 mCi

## 2023-08-20 MED ORDER — REGADENOSON 0.4 MG/5ML IV SOLN
INTRAVENOUS | Status: AC
  Filled 2023-08-20: qty 5

## 2023-08-20 MED ORDER — REGADENOSON 0.4 MG/5ML IV SOLN
0.4000 mg | Freq: Once | INTRAVENOUS | Status: AC
  Administered 2023-08-20: 0.4 mg via INTRAVENOUS

## 2023-08-20 MED ORDER — RUBIDIUM RB82 GENERATOR (RUBYFILL)
17.9800 | PACK | Freq: Once | INTRAVENOUS | Status: AC
  Administered 2023-08-20: 17.98 via INTRAVENOUS

## 2023-08-20 MED ORDER — RUBIDIUM RB82 GENERATOR (RUBYFILL)
17.9900 | PACK | Freq: Once | INTRAVENOUS | Status: AC
  Administered 2023-08-20: 17.99 via INTRAVENOUS

## 2023-08-20 NOTE — Telephone Encounter (Signed)
 Beth with [facility] is aware of below message and voiced her understanding.  She will reply this message to patient.  Nothing further needed.

## 2023-08-20 NOTE — Telephone Encounter (Signed)
[name] [name]; [name] [name] The daughter feels like the patient was having a bad day when she was evaluated for the 5L POC, She feels like if she were requalified she may qualify for a smaller liter flow. Please advise

## 2023-08-20 NOTE — Telephone Encounter (Signed)
 Can be evaluated in the future in clinic or by their oxygen  equipment company.   I just got another call that she was not feeling well after our visit in sent in more prednisone .  Dorn Chill, MD [facility] Office: [phone]

## 2023-08-21 NOTE — Telephone Encounter (Signed)
 Lm x1 for patient.

## 2023-08-25 ENCOUNTER — Other Ambulatory Visit: Payer: Self-pay | Admitting: Pulmonary Disease

## 2023-08-25 DIAGNOSIS — J441 Chronic obstructive pulmonary disease with (acute) exacerbation: Secondary | ICD-10-CM

## 2023-08-26 ENCOUNTER — Telehealth: Payer: Self-pay | Admitting: Pulmonary Disease

## 2023-08-26 DIAGNOSIS — I5032 Chronic diastolic (congestive) heart failure: Secondary | ICD-10-CM | POA: Diagnosis not present

## 2023-08-26 DIAGNOSIS — J449 Chronic obstructive pulmonary disease, unspecified: Secondary | ICD-10-CM | POA: Diagnosis not present

## 2023-08-26 DIAGNOSIS — R7303 Prediabetes: Secondary | ICD-10-CM | POA: Diagnosis not present

## 2023-08-26 DIAGNOSIS — F419 Anxiety disorder, unspecified: Secondary | ICD-10-CM | POA: Diagnosis not present

## 2023-08-26 DIAGNOSIS — F32A Depression, unspecified: Secondary | ICD-10-CM | POA: Diagnosis not present

## 2023-08-26 DIAGNOSIS — E039 Hypothyroidism, unspecified: Secondary | ICD-10-CM | POA: Diagnosis not present

## 2023-08-26 DIAGNOSIS — F17210 Nicotine dependence, cigarettes, uncomplicated: Secondary | ICD-10-CM | POA: Diagnosis not present

## 2023-08-26 DIAGNOSIS — I110 Hypertensive heart disease with heart failure: Secondary | ICD-10-CM | POA: Diagnosis not present

## 2023-08-26 DIAGNOSIS — I2510 Atherosclerotic heart disease of native coronary artery without angina pectoris: Secondary | ICD-10-CM | POA: Diagnosis not present

## 2023-08-27 ENCOUNTER — Telehealth (HOSPITAL_COMMUNITY): Payer: Self-pay | Admitting: Physician Assistant

## 2023-08-27 NOTE — Telephone Encounter (Signed)
Lm x2 for patient.  Will close encounter protocol. Pt has pending appt 4/23

## 2023-08-31 ENCOUNTER — Emergency Department (HOSPITAL_COMMUNITY): Payer: Medicare HMO

## 2023-08-31 ENCOUNTER — Other Ambulatory Visit: Payer: Self-pay

## 2023-08-31 ENCOUNTER — Inpatient Hospital Stay (HOSPITAL_COMMUNITY)
Admission: EM | Admit: 2023-08-31 | Discharge: 2023-09-05 | DRG: 190 | Disposition: A | Discharge status: Home / Self Care | Payer: Medicare HMO | Attending: Internal Medicine | Admitting: Internal Medicine

## 2023-08-31 ENCOUNTER — Encounter (HOSPITAL_COMMUNITY): Payer: Self-pay

## 2023-08-31 DIAGNOSIS — Z7951 Long term (current) use of inhaled steroids: Secondary | ICD-10-CM

## 2023-08-31 DIAGNOSIS — J9611 Chronic respiratory failure with hypoxia: Secondary | ICD-10-CM | POA: Diagnosis not present

## 2023-08-31 DIAGNOSIS — G4700 Insomnia, unspecified: Secondary | ICD-10-CM | POA: Diagnosis present

## 2023-08-31 DIAGNOSIS — I2510 Atherosclerotic heart disease of native coronary artery without angina pectoris: Secondary | ICD-10-CM | POA: Diagnosis present

## 2023-08-31 DIAGNOSIS — F32A Depression, unspecified: Secondary | ICD-10-CM | POA: Diagnosis present

## 2023-08-31 DIAGNOSIS — Z794 Long term (current) use of insulin: Secondary | ICD-10-CM

## 2023-08-31 DIAGNOSIS — Z91018 Allergy to other foods: Secondary | ICD-10-CM

## 2023-08-31 DIAGNOSIS — R062 Wheezing: Secondary | ICD-10-CM | POA: Diagnosis not present

## 2023-08-31 DIAGNOSIS — Z79890 Hormone replacement therapy (postmenopausal): Secondary | ICD-10-CM | POA: Diagnosis not present

## 2023-08-31 DIAGNOSIS — Z885 Allergy status to narcotic agent status: Secondary | ICD-10-CM

## 2023-08-31 DIAGNOSIS — J441 Chronic obstructive pulmonary disease with (acute) exacerbation: Secondary | ICD-10-CM | POA: Diagnosis not present

## 2023-08-31 DIAGNOSIS — Z8249 Family history of ischemic heart disease and other diseases of the circulatory system: Secondary | ICD-10-CM

## 2023-08-31 DIAGNOSIS — Z7902 Long term (current) use of antithrombotics/antiplatelets: Secondary | ICD-10-CM

## 2023-08-31 DIAGNOSIS — I700 Atherosclerosis of aorta: Secondary | ICD-10-CM | POA: Diagnosis not present

## 2023-08-31 DIAGNOSIS — I10 Essential (primary) hypertension: Secondary | ICD-10-CM | POA: Diagnosis present

## 2023-08-31 DIAGNOSIS — Z955 Presence of coronary angioplasty implant and graft: Secondary | ICD-10-CM

## 2023-08-31 DIAGNOSIS — E785 Hyperlipidemia, unspecified: Secondary | ICD-10-CM | POA: Diagnosis present

## 2023-08-31 DIAGNOSIS — J440 Chronic obstructive pulmonary disease with acute lower respiratory infection: Secondary | ICD-10-CM | POA: Diagnosis present

## 2023-08-31 DIAGNOSIS — Z833 Family history of diabetes mellitus: Secondary | ICD-10-CM

## 2023-08-31 DIAGNOSIS — I5032 Chronic diastolic (congestive) heart failure: Secondary | ICD-10-CM | POA: Diagnosis present

## 2023-08-31 DIAGNOSIS — J9811 Atelectasis: Secondary | ICD-10-CM | POA: Diagnosis not present

## 2023-08-31 DIAGNOSIS — Z7984 Long term (current) use of oral hypoglycemic drugs: Secondary | ICD-10-CM | POA: Diagnosis not present

## 2023-08-31 DIAGNOSIS — F17210 Nicotine dependence, cigarettes, uncomplicated: Secondary | ICD-10-CM | POA: Diagnosis not present

## 2023-08-31 DIAGNOSIS — Z79899 Other long term (current) drug therapy: Secondary | ICD-10-CM | POA: Diagnosis not present

## 2023-08-31 DIAGNOSIS — Z91040 Latex allergy status: Secondary | ICD-10-CM

## 2023-08-31 DIAGNOSIS — E7849 Other hyperlipidemia: Secondary | ICD-10-CM | POA: Diagnosis not present

## 2023-08-31 DIAGNOSIS — K219 Gastro-esophageal reflux disease without esophagitis: Secondary | ICD-10-CM | POA: Diagnosis present

## 2023-08-31 DIAGNOSIS — R069 Unspecified abnormalities of breathing: Secondary | ICD-10-CM | POA: Diagnosis not present

## 2023-08-31 DIAGNOSIS — J189 Pneumonia, unspecified organism: Secondary | ICD-10-CM | POA: Insufficient documentation

## 2023-08-31 DIAGNOSIS — R0902 Hypoxemia: Secondary | ICD-10-CM | POA: Diagnosis not present

## 2023-08-31 DIAGNOSIS — Z823 Family history of stroke: Secondary | ICD-10-CM

## 2023-08-31 DIAGNOSIS — F411 Generalized anxiety disorder: Secondary | ICD-10-CM | POA: Diagnosis present

## 2023-08-31 DIAGNOSIS — Z8679 Personal history of other diseases of the circulatory system: Secondary | ICD-10-CM | POA: Diagnosis not present

## 2023-08-31 DIAGNOSIS — I110 Hypertensive heart disease with heart failure: Secondary | ICD-10-CM | POA: Diagnosis not present

## 2023-08-31 DIAGNOSIS — Z7982 Long term (current) use of aspirin: Secondary | ICD-10-CM

## 2023-08-31 DIAGNOSIS — E7439 Other disorders of intestinal carbohydrate absorption: Secondary | ICD-10-CM | POA: Diagnosis present

## 2023-08-31 DIAGNOSIS — E039 Hypothyroidism, unspecified: Secondary | ICD-10-CM | POA: Diagnosis not present

## 2023-08-31 DIAGNOSIS — Z716 Tobacco abuse counseling: Secondary | ICD-10-CM

## 2023-08-31 DIAGNOSIS — Z1152 Encounter for screening for COVID-19: Secondary | ICD-10-CM

## 2023-08-31 DIAGNOSIS — E038 Other specified hypothyroidism: Secondary | ICD-10-CM | POA: Diagnosis not present

## 2023-08-31 DIAGNOSIS — R0602 Shortness of breath: Secondary | ICD-10-CM | POA: Diagnosis not present

## 2023-08-31 DIAGNOSIS — I252 Old myocardial infarction: Secondary | ICD-10-CM

## 2023-08-31 DIAGNOSIS — R7303 Prediabetes: Secondary | ICD-10-CM | POA: Diagnosis present

## 2023-08-31 LAB — CBC WITH DIFFERENTIAL/PLATELET
Abs Immature Granulocytes: 0.08 10*3/uL — ABNORMAL HIGH (ref 0.00–0.07)
Basophils Absolute: 0.1 10*3/uL (ref 0.0–0.1)
Basophils Relative: 0 %
Eosinophils Absolute: 0.1 10*3/uL (ref 0.0–0.5)
Eosinophils Relative: 1 %
HCT: 44.6 % (ref 36.0–46.0)
Hemoglobin: 14.7 g/dL (ref 12.0–15.0)
Immature Granulocytes: 1 %
Lymphocytes Relative: 20 %
Lymphs Abs: 3.1 10*3/uL (ref 0.7–4.0)
MCH: 32.7 pg (ref 26.0–34.0)
MCHC: 33 g/dL (ref 30.0–36.0)
MCV: 99.1 fL (ref 80.0–100.0)
Monocytes Absolute: 1.2 10*3/uL — ABNORMAL HIGH (ref 0.1–1.0)
Monocytes Relative: 8 %
Neutro Abs: 11 10*3/uL — ABNORMAL HIGH (ref 1.7–7.7)
Neutrophils Relative %: 70 %
Platelets: 443 10*3/uL — ABNORMAL HIGH (ref 150–400)
RBC: 4.5 MIL/uL (ref 3.87–5.11)
RDW: 16.3 % — ABNORMAL HIGH (ref 11.5–15.5)
WBC: 15.5 10*3/uL — ABNORMAL HIGH (ref 4.0–10.5)
nRBC: 0 % (ref 0.0–0.2)

## 2023-08-31 LAB — BASIC METABOLIC PANEL
Anion gap: 11 (ref 5–15)
BUN: 8 mg/dL (ref 8–23)
CO2: 26 mmol/L (ref 22–32)
Calcium: 9.3 mg/dL (ref 8.9–10.3)
Chloride: 102 mmol/L (ref 98–111)
Creatinine, Ser: 0.7 mg/dL (ref 0.44–1.00)
GFR, Estimated: >60 mL/min (ref >60)
Glucose, Bld: 161 mg/dL — ABNORMAL HIGH (ref 70–99)
Potassium: 4.1 mmol/L (ref 3.5–5.1)
Sodium: 139 mmol/L (ref 135–145)

## 2023-08-31 LAB — RESP PANEL BY RT-PCR (RSV, FLU A&B, COVID)  RVPGX2
Influenza A by PCR: NEGATIVE
Influenza B by PCR: NEGATIVE
Resp Syncytial Virus by PCR: NEGATIVE
SARS Coronavirus 2 by RT PCR: NEGATIVE

## 2023-08-31 MED ORDER — IPRATROPIUM-ALBUTEROL 0.5-2.5 (3) MG/3ML IN SOLN
3.0000 mL | Freq: Once | RESPIRATORY_TRACT | Status: AC
  Administered 2023-08-31: 3 mL via RESPIRATORY_TRACT
  Filled 2023-08-31: qty 3

## 2023-08-31 MED ORDER — DOXYCYCLINE HYCLATE 100 MG PO TABS
100.0000 mg | ORAL_TABLET | Freq: Once | ORAL | Status: AC
  Administered 2023-08-31: 100 mg via ORAL
  Filled 2023-08-31: qty 1

## 2023-08-31 NOTE — ED Provider Notes (Signed)
[place] EMERGENCY DEPARTMENT AT [facility] Provider Note   CSN: [id number] Arrival date & time: 08/31/23  2122     History {Add pertinent medical, surgical, social history, OB history to HPI:1} Chief Complaint  Patient presents with   Respiratory Distress    [name] [name] is a 68 y.o. female.  68 year old female with past medical history of COPD, hypertension, hyperlipidemia, and diabetes presenting to the emergency department today with cough and shortness of breath.  Patient states she has been feeling unwell over the past few days.  She has reports she has been having a cough productive of some yellow sputum.  Denies any hemoptysis.  She denies any leg pain or swelling.  She denies any associated chest pain.  States that she was becoming more short of breath despite using her home albuterol so she came to the ER today for further evaluation regarding this.        Home Medications Prior to Admission medications   Medication Sig Start Date End Date Taking? Authorizing Provider  albuterol (VENTOLIN HFA) 108 (90 Base) MCG/ACT inhaler INHALE 2 PUFFS INTO THE LUNGS EVERY 6 HOURS AS NEEDED FOR WHEEZING OR SHORTNESS OF BREATH 07/18/23   [name], MD  alendronate (FOSAMAX) 35 MG tablet Take 1 tablet (35 mg total) by mouth every 7 (seven) days. Take with a full glass of water on an empty stomach. Sit upright for 30-60 mins after taking Patient taking differently: Take 35 mg by mouth every Wednesday. 02/27/23   [name], MD  amLODipine (NORVASC) 5 MG tablet Take 1 tablet (5 mg total) by mouth daily. Patient taking differently: Take 5 mg by mouth in the morning. 09/18/22   Joylene Grapes, NP  amoxicillin-clavulanate (AUGMENTIN) 875-125 MG tablet Take 1 tablet by mouth every 12 (twelve) hours for 9 doses. 07/15/23 07/20/23  Annett Fabian, MD  aspirin EC 81 MG tablet Take 81 mg by mouth in the morning.    [provider]  atorvastatin (LIPITOR) 80 MG  tablet Take 1 tablet (80 mg total) by mouth daily, please schedule appointment for more refills 09/18/22   Joylene Grapes, NP  azithromycin (ZITHROMAX) 250 MG tablet Take 2 tablets (500 mg total) by mouth daily for 1 day, THEN 1 tablet (250 mg total) daily for 4 days. 08/07/23 08/12/23  Martina Sinner, MD  Blood Glucose Monitoring Suppl (TRUE METRIX METER) DEVI 1 kit by Does not apply route 4 (four) times daily. 04/04/17   Vivianne Master, PA-C  busPIRone (BUSPAR) 15 MG tablet Take 2 tablets (30 mg total) by mouth in the morning AND 1 tablet (15 mg total) daily at 12 noon AND 1 tablet (15 mg total) every evening. Patient taking differently: Take 2 tablets (30 mg total) by mouth in the morning AND 1 tablet (15 mg total) daily at 12 noon AND 1 tablet (15 mg total) every evening. (Patient takes 4 tabs every night at bedtime) 06/27/23   Nwoko, Tommas Olp, PA  clonazePAM (KLONOPIN) 0.5 MG disintegrating tablet Take 1 tablet (0.5 mg total) by mouth 2 (two) times daily. 07/23/23   Faith Rogue, DO  doxepin (SINEQUAN) 75 MG capsule Take 1 capsule (75 mg total) by mouth at bedtime. 06/27/23   Nwoko, Tommas Olp, PA  escitalopram (LEXAPRO) 20 MG tablet Take 1 tablet (20 mg total) by mouth daily. Patient taking differently: Take 20 mg by mouth in the morning. 06/27/23   Nwoko, Tommas Olp, PA  ezetimibe (ZETIA)  10 MG tablet Take 1 tablet (10 mg total) by mouth daily. Patient taking differently: Take 10 mg by mouth at bedtime. 07/04/23   Azalee Course, PA  Fluticasone-Umeclidin-Vilant (TRELEGY ELLIPTA) 200-62.5-25 MCG/ACT AEPB Inhale 1 puff into the lungs daily. 08/07/23   Martina Sinner, MD  furosemide (LASIX) 20 MG tablet Take 1 tablet (20 mg total) by mouth daily. Patient taking differently: Take 20 mg by mouth in the morning. 09/18/22   Joylene Grapes, NP  gabapentin (NEURONTIN) 600 MG tablet TAKE 1 TABLET BY MOUTH 4 TIMES DAILY Patient taking differently: Take 600 mg by mouth 2 (two) times daily. 12/20/22   [name], MD  glucose blood (TRUE METRIX BLOOD GLUCOSE TEST) test strip Use as instructed 01/21/18   [name], MD  ipratropium-albuterol (DUONEB) 0.5-2.5 (3) MG/3ML SOLN Take 3 mLs by nebulization every 6 (six) hours as needed. 07/23/23   Faith Rogue, DO  levothyroxine (SYNTHROID) 112 MCG tablet Take 1 tablet (112 mcg total) by mouth daily. Patient taking differently: Take 112 mcg by mouth in the morning. 12/20/22   [name], MD  losartan (COZAAR) 50 MG tablet Take 1.5 tablets (75 mg total) by mouth daily. Patient taking differently: Take 75 mg by mouth in the morning. 09/18/22   Joylene Grapes, NP  metFORMIN (GLUCOPHAGE) 500 MG tablet Take 1 tablet (500 mg total) by mouth 2 (two) times daily with a meal. Patient taking differently: Take 500 mg by mouth in the morning. 04/23/23   [name], MD  mirtazapine (REMERON) 45 MG tablet Take 1 tablet (45 mg total) by mouth at bedtime. 06/27/23   Nwoko, Tommas Olp, PA  montelukast (SINGULAIR) 10 MG tablet Take 1 tablet (10 mg total) by mouth at bedtime. 12/20/22   [name], MD  nebivolol (BYSTOLIC) 10 MG tablet Take 1 tablet (10 mg total) by mouth daily. Patient taking differently: Take 10 mg by mouth at bedtime. 09/18/22   Joylene Grapes, NP  nitroGLYCERIN (NITROSTAT) 0.4 MG SL tablet Place 1 tablet (0.4 mg total) under the tongue every 5 (five) minutes as needed for chest pain. Patient not taking: Reported on 07/21/2023 09/18/22 07/26/23  Joylene Grapes, NP  omeprazole (PRILOSEC) 40 MG capsule Take 1 capsule (40 mg total) by mouth 2 (two) times daily before a meal. 02/19/23   Lemmon, Violet Baldy, PA  predniSONE (DELTASONE) 10 MG tablet Take 4 tablets (40 mg total) by mouth daily with breakfast for 3 days, THEN 3 tablets (30 mg total) daily with breakfast for 3 days, THEN 2 tablets (20 mg total) daily with breakfast for 3 days, THEN 1 tablet (10 mg total) daily with breakfast for 3 days. 08/19/23 09/01/23  Martina Sinner, MD   Spacer/Aero-Holding Rudean Curt Use with the albuterol inhaler. 07/22/23   Marrianne Mood, MD  ticagrelor (BRILINTA) 60 MG TABS tablet Take 1 tablet (60 mg total) by mouth 2 (two) times daily. 09/18/22   Joylene Grapes, NP  triamcinolone (KENALOG) 0.025 % ointment Apply 1 Application topically 2 (two) times daily as needed. 01/17/23   [name], MD  TRUEPLUS LANCETS 28G MISC 28 g by Does not apply route QID. 04/04/17   Vivianne Master, PA-C  Zoster Vaccine Adjuvanted [facility]) injection Inject 0.5 mLs into the muscle once for 1 dose. 05/20/23 05/20/23  [name], MD      Allergies    Avocado, Latex, and Codeine    Review of Systems  Review of Systems  Respiratory:  Positive for cough and shortness of breath.   All other systems reviewed and are negative.   Physical Exam Updated Vital Signs BP (!) 116/59   Pulse 85   Temp 97.8 F (36.6 C) (Oral)   Resp 17   Wt 68 kg   SpO2 100%   BMI 30.30 kg/m  Physical Exam Vitals and nursing note reviewed.   Gen: Conversational dyspnea noted, speaking in 3-4 word sentences Eyes: PERRL, EOMI HEENT: no oropharyngeal swelling Neck: trachea midline Resp: Diminished with diffuse wheezes throughout all lung fields Card: RRR, no murmurs, rubs, or gallops Abd: nontender, nondistended Extremities: no calf tenderness, no edema Vascular: 2+ radial pulses bilaterally, 2+ DP pulses bilaterally Skin: no rashes Psyc: acting appropriately   ED Results / Procedures / Treatments   Labs (all labs ordered are listed, but only abnormal results are displayed) Labs Reviewed  CBC WITH DIFFERENTIAL/PLATELET - Abnormal; Notable for the following components:      Result Value   WBC 15.5 (*)    RDW 16.3 (*)    Platelets 443 (*)    Neutro Abs 11.0 (*)    Monocytes Absolute 1.2 (*)    Abs Immature Granulocytes 0.08 (*)    All other components within normal limits  RESP PANEL BY RT-PCR (RSV, FLU A&B, COVID)  [id number]  BASIC METABOLIC PANEL   BRAIN NATRIURETIC PEPTIDE    EKG None  Radiology No results found.  Procedures Procedures  {Document cardiac monitor, telemetry assessment procedure when appropriate:1}  Medications Ordered in ED Medications  ipratropium-albuterol (DUONEB) 0.5-2.5 (3) MG/3ML nebulizer solution 3 mL (has no administration in time range)  ipratropium-albuterol (DUONEB) 0.5-2.5 (3) MG/3ML nebulizer solution 3 mL (has no administration in time range)    ED Course/ Medical Decision Making/ A&P   {   Click here for ABCD2, HEART and other calculatorsREFRESH Note before signing :1}                              Medical Decision Making 68 year old female with past medical history of COPD, diabetes, hypertension, and hyperlipidemia presenting to the emergency department today with cough and shortness of breath.  The certainly does seem consistent with COPD exacerbation.  I will further evaluate her here with basic labs Wels and EKG, chest x-ray, and troponin for further evaluation for ACS, pulmonary infiltrates, told pulmonary edema, or pneumothorax.  I will give the patient DuoNeb's here.  She already received Solu-Medrol with medics.  Also give her azithromycin.  I will obtain a COVID and flu swab on the patient and reevaluate but she will likely require admission.  Amount and/or Complexity of Data Reviewed Labs: ordered. Radiology: ordered.  Risk Prescription drug management.   ***  {Document critical care time when appropriate:1} {Document review of labs and clinical decision tools ie heart score, Chads2Vasc2 etc:1}  {Document your independent review of radiology images, and any outside records:1} {Document your discussion with family members, caretakers, and with consultants:1} {Document social determinants of health affecting pt's care:1} {Document your decision making why or why not admission, treatments were needed:1} Final Clinical Impression(s) / ED Diagnoses Final diagnoses:  None     Rx / DC Orders ED Discharge Orders     None

## 2023-08-31 NOTE — ED Triage Notes (Signed)
Pt BIB [facility] for respiratory distress that started tonight. Pt has hx COPD. Pt was 89% on RA when EMS arrived, pt does usually wear 2L of o2. Per EMS, pt had had wheezing in all fields on arrival. Pt reports back pain and nausea.    20G L wrist 15mg  albuterol 0.5 atrovent 125mg  solumedrol  172/80 86 CBG 122

## 2023-09-01 ENCOUNTER — Encounter (HOSPITAL_COMMUNITY): Payer: Self-pay | Admitting: Internal Medicine

## 2023-09-01 DIAGNOSIS — Z823 Family history of stroke: Secondary | ICD-10-CM | POA: Diagnosis not present

## 2023-09-01 DIAGNOSIS — J189 Pneumonia, unspecified organism: Secondary | ICD-10-CM | POA: Insufficient documentation

## 2023-09-01 DIAGNOSIS — E038 Other specified hypothyroidism: Secondary | ICD-10-CM

## 2023-09-01 DIAGNOSIS — F17210 Nicotine dependence, cigarettes, uncomplicated: Secondary | ICD-10-CM | POA: Insufficient documentation

## 2023-09-01 DIAGNOSIS — Z7902 Long term (current) use of antithrombotics/antiplatelets: Secondary | ICD-10-CM | POA: Diagnosis not present

## 2023-09-01 DIAGNOSIS — Z955 Presence of coronary angioplasty implant and graft: Secondary | ICD-10-CM | POA: Diagnosis not present

## 2023-09-01 DIAGNOSIS — E785 Hyperlipidemia, unspecified: Secondary | ICD-10-CM | POA: Diagnosis present

## 2023-09-01 DIAGNOSIS — I5032 Chronic diastolic (congestive) heart failure: Secondary | ICD-10-CM | POA: Diagnosis present

## 2023-09-01 DIAGNOSIS — J9811 Atelectasis: Secondary | ICD-10-CM | POA: Diagnosis present

## 2023-09-01 DIAGNOSIS — I110 Hypertensive heart disease with heart failure: Secondary | ICD-10-CM | POA: Diagnosis present

## 2023-09-01 DIAGNOSIS — Z7984 Long term (current) use of oral hypoglycemic drugs: Secondary | ICD-10-CM | POA: Diagnosis not present

## 2023-09-01 DIAGNOSIS — Z833 Family history of diabetes mellitus: Secondary | ICD-10-CM | POA: Diagnosis not present

## 2023-09-01 DIAGNOSIS — G4700 Insomnia, unspecified: Secondary | ICD-10-CM | POA: Diagnosis present

## 2023-09-01 DIAGNOSIS — J9611 Chronic respiratory failure with hypoxia: Secondary | ICD-10-CM

## 2023-09-01 DIAGNOSIS — E039 Hypothyroidism, unspecified: Secondary | ICD-10-CM | POA: Diagnosis present

## 2023-09-01 DIAGNOSIS — E7849 Other hyperlipidemia: Secondary | ICD-10-CM

## 2023-09-01 DIAGNOSIS — I2510 Atherosclerotic heart disease of native coronary artery without angina pectoris: Secondary | ICD-10-CM | POA: Diagnosis present

## 2023-09-01 DIAGNOSIS — Z79890 Hormone replacement therapy (postmenopausal): Secondary | ICD-10-CM | POA: Diagnosis not present

## 2023-09-01 DIAGNOSIS — J440 Chronic obstructive pulmonary disease with acute lower respiratory infection: Secondary | ICD-10-CM | POA: Diagnosis present

## 2023-09-01 DIAGNOSIS — F411 Generalized anxiety disorder: Secondary | ICD-10-CM

## 2023-09-01 DIAGNOSIS — F32A Depression, unspecified: Secondary | ICD-10-CM

## 2023-09-01 DIAGNOSIS — J441 Chronic obstructive pulmonary disease with (acute) exacerbation: Secondary | ICD-10-CM

## 2023-09-01 DIAGNOSIS — Z794 Long term (current) use of insulin: Secondary | ICD-10-CM | POA: Diagnosis not present

## 2023-09-01 DIAGNOSIS — I10 Essential (primary) hypertension: Secondary | ICD-10-CM

## 2023-09-01 DIAGNOSIS — Z8679 Personal history of other diseases of the circulatory system: Secondary | ICD-10-CM

## 2023-09-01 DIAGNOSIS — Z1152 Encounter for screening for COVID-19: Secondary | ICD-10-CM | POA: Diagnosis not present

## 2023-09-01 DIAGNOSIS — Z79899 Other long term (current) drug therapy: Secondary | ICD-10-CM | POA: Diagnosis not present

## 2023-09-01 DIAGNOSIS — Z8249 Family history of ischemic heart disease and other diseases of the circulatory system: Secondary | ICD-10-CM | POA: Diagnosis not present

## 2023-09-01 LAB — GLUCOSE, CAPILLARY
Glucose-Capillary: 152 mg/dL — ABNORMAL HIGH (ref 70–99)
Glucose-Capillary: 174 mg/dL — ABNORMAL HIGH (ref 70–99)
Glucose-Capillary: 127 mg/dL — ABNORMAL HIGH (ref 70–99)

## 2023-09-01 LAB — RESPIRATORY PANEL BY PCR

## 2023-09-01 LAB — COMPREHENSIVE METABOLIC PANEL
ALT: 18 U/L (ref 0–44)
AST: 16 U/L (ref 15–41)
Albumin: 3 g/dL — ABNORMAL LOW (ref 3.5–5.0)
Alkaline Phosphatase: 60 U/L (ref 38–126)
Anion gap: 11 (ref 5–15)
BUN: 10 mg/dL (ref 8–23)
CO2: 25 mmol/L (ref 22–32)
Calcium: 8.5 mg/dL — ABNORMAL LOW (ref 8.9–10.3)
Chloride: 102 mmol/L (ref 98–111)
Creatinine, Ser: 0.56 mg/dL (ref 0.44–1.00)
GFR, Estimated: >60 mL/min (ref >60)
Glucose, Bld: 180 mg/dL — ABNORMAL HIGH (ref 70–99)
Potassium: 4.5 mmol/L (ref 3.5–5.1)
Sodium: 138 mmol/L (ref 135–145)
Total Bilirubin: 0.7 mg/dL (ref 0.0–1.2)
Total Protein: 5.9 g/dL — ABNORMAL LOW (ref 6.5–8.1)

## 2023-09-01 LAB — STREP PNEUMONIAE URINARY ANTIGEN: Strep Pneumo Urinary Antigen: NEGATIVE

## 2023-09-01 LAB — CBC
HCT: 41.1 % (ref 36.0–46.0)
Hemoglobin: 13.5 g/dL (ref 12.0–15.0)
MCH: 32.2 pg (ref 26.0–34.0)
MCHC: 32.8 g/dL (ref 30.0–36.0)
MCV: 98.1 fL (ref 80.0–100.0)
Platelets: 430 10*3/uL — ABNORMAL HIGH (ref 150–400)
RBC: 4.19 MIL/uL (ref 3.87–5.11)
RDW: 16.3 % — ABNORMAL HIGH (ref 11.5–15.5)
WBC: 12.5 10*3/uL — ABNORMAL HIGH (ref 4.0–10.5)
nRBC: 0 % (ref 0.0–0.2)

## 2023-09-01 LAB — HEMOGLOBIN A1C
Hgb A1c MFr Bld: 6.2 % — ABNORMAL HIGH (ref 4.8–5.6)
Mean Plasma Glucose: 131.24 mg/dL

## 2023-09-01 LAB — PROCALCITONIN: Procalcitonin: <0.1 ng/mL

## 2023-09-01 LAB — BRAIN NATRIURETIC PEPTIDE: B Natriuretic Peptide: 120 pg/mL — ABNORMAL HIGH (ref 0.0–100.0)

## 2023-09-01 LAB — CBG MONITORING, ED: Glucose-Capillary: 170 mg/dL — ABNORMAL HIGH (ref 70–99)

## 2023-09-01 MED ORDER — TICAGRELOR 60 MG PO TABS
60.0000 mg | ORAL_TABLET | Freq: Two times a day (BID) | ORAL | Status: DC
  Administered 2023-09-01 – 2023-09-05 (×9): 60 mg via ORAL
  Filled 2023-09-01 (×11): qty 1

## 2023-09-01 MED ORDER — METHYLPREDNISOLONE SODIUM SUCC 40 MG IJ SOLR
40.0000 mg | Freq: Two times a day (BID) | INTRAMUSCULAR | Status: AC
  Administered 2023-09-01 – 2023-09-05 (×10): 40 mg via INTRAVENOUS
  Filled 2023-09-01 (×10): qty 1

## 2023-09-01 MED ORDER — SODIUM CHLORIDE 0.9 % IV SOLN: 250.0000 mL | INTRAVENOUS | Status: AC | PRN

## 2023-09-01 MED ORDER — SODIUM CHLORIDE 0.9 % IV SOLN
100.0000 mg | Freq: Two times a day (BID) | INTRAVENOUS | Status: DC
  Administered 2023-09-01 – 2023-09-02 (×3): 100 mg via INTRAVENOUS
  Filled 2023-09-01 (×3): qty 100

## 2023-09-01 MED ORDER — LEVOTHYROXINE SODIUM 112 MCG PO TABS
112.0000 ug | ORAL_TABLET | Freq: Every morning | ORAL | Status: DC
  Administered 2023-09-01 – 2023-09-05 (×5): 112 ug via ORAL
  Filled 2023-09-01 (×5): qty 1

## 2023-09-01 MED ORDER — BUSPIRONE HCL 5 MG PO TABS
30.0000 mg | ORAL_TABLET | Freq: Every morning | ORAL | Status: DC
  Administered 2023-09-01 – 2023-09-05 (×5): 30 mg via ORAL
  Filled 2023-09-01 (×5): qty 6

## 2023-09-01 MED ORDER — PROCHLORPERAZINE EDISYLATE 10 MG/2ML IJ SOLN: 5.0000 mg | Freq: Four times a day (QID) | INTRAMUSCULAR | Status: DC | PRN

## 2023-09-01 MED ORDER — ACETAMINOPHEN 325 MG PO TABS: 650.0000 mg | ORAL_TABLET | Freq: Four times a day (QID) | ORAL | Status: DC | PRN

## 2023-09-01 MED ORDER — ENOXAPARIN SODIUM 40 MG/0.4ML IJ SOSY
40.0000 mg | PREFILLED_SYRINGE | INTRAMUSCULAR | Status: DC
  Administered 2023-09-01 – 2023-09-05 (×5): 40 mg via SUBCUTANEOUS
  Filled 2023-09-01 (×5): qty 0.4

## 2023-09-01 MED ORDER — SODIUM CHLORIDE 0.9% FLUSH
3.0000 mL | Freq: Two times a day (BID) | INTRAVENOUS | Status: DC
  Administered 2023-09-01 – 2023-09-05 (×9): 3 mL via INTRAVENOUS

## 2023-09-01 MED ORDER — NICOTINE 21 MG/24HR TD PT24
21.0000 mg | MEDICATED_PATCH | Freq: Every day | TRANSDERMAL | Status: DC
  Administered 2023-09-01 – 2023-09-05 (×5): 21 mg via TRANSDERMAL
  Filled 2023-09-01 (×5): qty 1

## 2023-09-01 MED ORDER — IPRATROPIUM-ALBUTEROL 0.5-2.5 (3) MG/3ML IN SOLN
3.0000 mL | RESPIRATORY_TRACT | Status: DC | PRN
Start: 2023-09-01 — End: 2023-09-01
  Administered 2023-09-01: 3 mL via RESPIRATORY_TRACT
  Filled 2023-09-01: qty 3

## 2023-09-01 MED ORDER — INSULIN ASPART 100 UNIT/ML IJ SOLN
0.0000 [IU] | Freq: Three times a day (TID) | INTRAMUSCULAR | Status: DC
  Administered 2023-09-01 – 2023-09-02 (×3): 2 [IU] via SUBCUTANEOUS
  Administered 2023-09-02: 1 [IU] via SUBCUTANEOUS
  Administered 2023-09-03 – 2023-09-04 (×4): 2 [IU] via SUBCUTANEOUS
  Administered 2023-09-04: 3 [IU] via SUBCUTANEOUS
  Administered 2023-09-04: 1 [IU] via SUBCUTANEOUS
  Administered 2023-09-05: 3 [IU] via SUBCUTANEOUS

## 2023-09-01 MED ORDER — BUSPIRONE HCL 5 MG PO TABS
15.0000 mg | ORAL_TABLET | Freq: Every evening | ORAL | Status: DC
  Administered 2023-09-01 – 2023-09-04 (×4): 15 mg via ORAL
  Filled 2023-09-01 (×4): qty 3

## 2023-09-01 MED ORDER — ALBUTEROL SULFATE (2.5 MG/3ML) 0.083% IN NEBU
2.5000 mg | INHALATION_SOLUTION | RESPIRATORY_TRACT | Status: DC | PRN
Start: 2023-09-01 — End: 2023-09-05
  Administered 2023-09-02 – 2023-09-04 (×2): 2.5 mg via RESPIRATORY_TRACT
  Filled 2023-09-01 (×2): qty 3

## 2023-09-01 MED ORDER — SODIUM CHLORIDE 0.9 % IV SOLN
2.0000 g | INTRAVENOUS | Status: DC
  Administered 2023-09-01 – 2023-09-05 (×5): 2 g via INTRAVENOUS
  Filled 2023-09-01 (×5): qty 20

## 2023-09-01 MED ORDER — BUSPIRONE HCL 5 MG PO TABS
15.0000 mg | ORAL_TABLET | Freq: Every day | ORAL | Status: DC
  Administered 2023-09-01 – 2023-09-05 (×5): 15 mg via ORAL
  Filled 2023-09-01 (×5): qty 3

## 2023-09-01 MED ORDER — GUAIFENESIN ER 600 MG PO TB12
600.0000 mg | ORAL_TABLET | Freq: Two times a day (BID) | ORAL | Status: DC
  Administered 2023-09-01 – 2023-09-05 (×10): 600 mg via ORAL
  Filled 2023-09-01 (×10): qty 1

## 2023-09-01 MED ORDER — HEPARIN SODIUM (PORCINE) 5000 UNIT/ML IJ SOLN: 5000.0000 [IU] | Freq: Three times a day (TID) | INTRAMUSCULAR | Status: DC

## 2023-09-01 MED ORDER — IPRATROPIUM-ALBUTEROL 0.5-2.5 (3) MG/3ML IN SOLN
3.0000 mL | Freq: Four times a day (QID) | RESPIRATORY_TRACT | Status: DC
  Administered 2023-09-01 – 2023-09-05 (×16): 3 mL via RESPIRATORY_TRACT
  Filled 2023-09-01 (×16): qty 3

## 2023-09-01 MED ORDER — ONDANSETRON HCL 4 MG/2ML IJ SOLN
4.0000 mg | Freq: Once | INTRAMUSCULAR | Status: AC
  Administered 2023-09-01: 4 mg via INTRAVENOUS
  Filled 2023-09-01: qty 2

## 2023-09-01 MED ORDER — INSULIN ASPART 100 UNIT/ML IJ SOLN: 0.0000 [IU] | Freq: Every day | INTRAMUSCULAR | Status: DC

## 2023-09-01 MED ORDER — ACETAMINOPHEN 650 MG RE SUPP: 650.0000 mg | Freq: Four times a day (QID) | RECTAL | Status: DC | PRN

## 2023-09-01 MED ORDER — FLUTICASONE FUROATE-VILANTEROL 200-25 MCG/ACT IN AEPB: 1.0000 | INHALATION_SPRAY | Freq: Every day | RESPIRATORY_TRACT | Status: DC

## 2023-09-01 MED ORDER — SODIUM CHLORIDE 0.9% FLUSH: 3.0000 mL | INTRAVENOUS | Status: DC | PRN

## 2023-09-01 MED ORDER — ATORVASTATIN CALCIUM 80 MG PO TABS
80.0000 mg | ORAL_TABLET | Freq: Every day | ORAL | Status: DC
  Administered 2023-09-01 – 2023-09-05 (×5): 80 mg via ORAL
  Filled 2023-09-01 (×5): qty 1

## 2023-09-01 MED ORDER — LOSARTAN POTASSIUM 50 MG PO TABS
75.0000 mg | ORAL_TABLET | Freq: Every day | ORAL | Status: DC
  Administered 2023-09-01 – 2023-09-05 (×5): 75 mg via ORAL
  Filled 2023-09-01 (×5): qty 1

## 2023-09-01 MED ORDER — CLONAZEPAM 0.25 MG PO TBDP
0.5000 mg | ORAL_TABLET | Freq: Two times a day (BID) | ORAL | Status: DC
  Administered 2023-09-01 – 2023-09-05 (×9): 0.5 mg via ORAL
  Filled 2023-09-01 (×10): qty 2

## 2023-09-01 MED ORDER — BUDESONIDE 0.25 MG/2ML IN SUSP
0.2500 mg | Freq: Two times a day (BID) | RESPIRATORY_TRACT | Status: DC
  Administered 2023-09-01 – 2023-09-05 (×8): 0.25 mg via RESPIRATORY_TRACT
  Filled 2023-09-01 (×8): qty 2

## 2023-09-01 MED ORDER — EZETIMIBE 10 MG PO TABS
10.0000 mg | ORAL_TABLET | Freq: Every day | ORAL | Status: DC
  Administered 2023-09-01 – 2023-09-05 (×5): 10 mg via ORAL
  Filled 2023-09-01 (×5): qty 1

## 2023-09-01 MED ORDER — ESCITALOPRAM OXALATE 20 MG PO TABS
20.0000 mg | ORAL_TABLET | Freq: Every morning | ORAL | Status: DC
  Administered 2023-09-01 – 2023-09-05 (×5): 20 mg via ORAL
  Filled 2023-09-01 (×5): qty 1

## 2023-09-01 MED ORDER — FUROSEMIDE 20 MG PO TABS
20.0000 mg | ORAL_TABLET | Freq: Every morning | ORAL | Status: DC
  Administered 2023-09-01 – 2023-09-05 (×5): 20 mg via ORAL
  Filled 2023-09-01 (×5): qty 1

## 2023-09-01 MED ORDER — MONTELUKAST SODIUM 10 MG PO TABS
10.0000 mg | ORAL_TABLET | Freq: Every day | ORAL | Status: DC
  Administered 2023-09-01 – 2023-09-04 (×4): 10 mg via ORAL
  Filled 2023-09-01 (×4): qty 1

## 2023-09-01 MED ORDER — UMECLIDINIUM BROMIDE 62.5 MCG/ACT IN AEPB
1.0000 | INHALATION_SPRAY | Freq: Every day | RESPIRATORY_TRACT | Status: DC
  Filled 2023-09-01: qty 7

## 2023-09-01 MED ORDER — PANTOPRAZOLE SODIUM 40 MG PO TBEC
40.0000 mg | DELAYED_RELEASE_TABLET | Freq: Every day | ORAL | Status: DC
  Administered 2023-09-01 – 2023-09-05 (×5): 40 mg via ORAL
  Filled 2023-09-01 (×5): qty 1

## 2023-09-01 MED ORDER — UMECLIDINIUM BROMIDE 62.5 MCG/ACT IN AEPB: 1.0000 | INHALATION_SPRAY | Freq: Every day | RESPIRATORY_TRACT | Status: DC

## 2023-09-01 MED ORDER — BISACODYL 5 MG PO TBEC: 5.0000 mg | DELAYED_RELEASE_TABLET | Freq: Every day | ORAL | Status: DC | PRN

## 2023-09-01 MED ORDER — FLUTICASONE FUROATE-VILANTEROL 200-25 MCG/ACT IN AEPB
1.0000 | INHALATION_SPRAY | Freq: Every day | RESPIRATORY_TRACT | Status: DC
Start: 2023-09-01 — End: 2023-09-01
  Filled 2023-09-01: qty 28

## 2023-09-01 MED ORDER — NEBIVOLOL HCL 10 MG PO TABS
10.0000 mg | ORAL_TABLET | Freq: Every day | ORAL | Status: DC
  Administered 2023-09-01 – 2023-09-05 (×5): 10 mg via ORAL
  Filled 2023-09-01 (×6): qty 1

## 2023-09-01 MED ORDER — DOXEPIN HCL 25 MG PO CAPS
75.0000 mg | ORAL_CAPSULE | Freq: Every day | ORAL | Status: DC
Start: 2023-09-01 — End: 2023-09-05
  Administered 2023-09-01 – 2023-09-04 (×4): 75 mg via ORAL
  Filled 2023-09-01: qty 1
  Filled 2023-09-01 (×5): qty 3

## 2023-09-01 MED ORDER — AMLODIPINE BESYLATE 5 MG PO TABS
5.0000 mg | ORAL_TABLET | Freq: Every day | ORAL | Status: DC
Start: 2023-09-01 — End: 2023-09-05
  Administered 2023-09-01 – 2023-09-05 (×5): 5 mg via ORAL
  Filled 2023-09-01 (×5): qty 1

## 2023-09-01 MED ORDER — NICOTINE POLACRILEX 2 MG MT GUM
2.0000 mg | CHEWING_GUM | OROMUCOSAL | Status: DC | PRN
Start: 2023-09-01 — End: 2023-09-05

## 2023-09-01 NOTE — Plan of Care (Signed)
  Problem: Education: Goal: Knowledge of General Education information will improve Description: Including pain rating scale, medication(s)/side effects and non-pharmacologic comfort measures 09/01/2023 2320 by Carylon Perches, LPN Outcome: Progressing 09/01/2023 2319 by Carylon Perches, LPN Outcome: Progressing   Problem: Health Behavior/Discharge Planning: Goal: Ability to manage health-related needs will improve 09/01/2023 2320 by Carylon Perches, LPN Outcome: Progressing 09/01/2023 2319 by Carylon Perches, LPN Outcome: Progressing   Problem: Clinical Measurements: Goal: Ability to maintain clinical measurements within normal limits will improve 09/01/2023 2320 by Carylon Perches, LPN Outcome: Progressing 09/01/2023 2319 by Carylon Perches, LPN Outcome: Progressing Goal: Will remain free from infection Outcome: Progressing Goal: Diagnostic test results will improve Outcome: Progressing Goal: Respiratory complications will improve Outcome: Progressing Goal: Cardiovascular complication will be avoided Outcome: Progressing   Problem: Activity: Goal: Risk for activity intolerance will decrease Outcome: Progressing   Problem: Nutrition: Goal: Adequate nutrition will be maintained Outcome: Progressing   Problem: Elimination: Goal: Will not experience complications related to bowel motility Outcome: Progressing   Problem: Pain Managment: Goal: General experience of comfort will improve and/or be controlled Outcome: Progressing   Problem: Safety: Goal: Ability to remain free from injury will improve Outcome: Progressing   Problem: Skin Integrity: Goal: Risk for impaired skin integrity will decrease Outcome: Progressing

## 2023-09-01 NOTE — Progress Notes (Signed)
PROGRESS NOTE    [name] [name]  ZOX:[id number] DOB: 1955/08/09 DOA: 08/31/2023 PCP: [name], MD  Outpatient Specialists:     Brief Narrative:  Patient is a 68 year old female, with past medical history significant for COPD, continuous tobacco abuse since the age of 9 (patient continues to smoke 1 pack of cigarettes daily), chronic respiratory failure on 2 L of supplemental oxygen via nasal cannula at home, hypothyroidism, hypertension, dyslipidemia, anxiety and depression.  Patient was admitted with COPD exacerbation.  Apparently, symptoms started about a week ago with wheezing, worsening shortness of breath and cough that is productive of whitish sputum.  09/01/2023: Patient seen.  Patient continues to wheeze.  No fever or chills.   Assessment & Plan:   Principal Problem:   COPD with acute exacerbation (HCC) Active Problems:   Hypothyroidism   Chronic depression   Essential hypertension   Hyperlipidemia   GAD (generalized anxiety disorder)   Chronic hypoxic respiratory failure (HCC)   Continuous dependence on cigarette smoking   History of CAD (coronary artery disease)   CAP (community acquired pneumonia)   COPD exacerbation History of chronic COPD Comment echo pneumonia History of chronic hypoxic respiratory failure 2 to 3 L oxygen at baseline -Nebs DuoNeb, nebs albuterol as needed, IV Solu-Medrol and nebs Pulmicort. -Continue supplemental oxygen. -Patient was counseled to quit tobacco use.    History of CAD -Stable.  No chest pain. -Continue Lipitor 80 mg daily, nebivolol 10 mg daily and Brilinta 60 mg twice daily.   Essential hypertension -Blood pressure is controlled. - Continue home regimens include amlodipine 5 mg, Lasix 20 mg, losartan 75 mg and nebivolol 10 mg.   History of prolonged QT prolongation - Continue to avoid any QT prolonging medication.   Hyperlipidemia - Continue Lipitor and ezetimibe.     Hypothyroidism - Continue levo  hyroxine   Chronic smoker -Patient has smoked cigarettes since age of 67. -Patient currently smokes 1 pack of cigarettes daily. -Counseled to quit tobacco use.     Generalized anxiety disorder -Stable.   Insomnia   Prediabetic/glucose intolerance History of prediabetes -Sliding scale insulin coverage.  DVT prophylaxis: Subcutaneous Lovenox. Code Status: Full code. Family Communication:  Disposition Plan: Patient remains inpatient.   Consultants:  None.  Procedures:  None.  Antimicrobials:  Doxycycline   Subjective: Shortness of breath and wheezing. Productive cough.  Objective: Vitals:   09/01/23 0645 09/01/23 0700 09/01/23 0715 09/01/23 0806  BP: 111/62 127/73 132/66 131/67  Pulse: 65 71 81 77  Resp: 19 18 14 16   Temp:    97.8 F (36.6 C)  TempSrc:    Oral  SpO2: 97% 99% 97% 98%  Weight:        Intake/Output Summary (Last 24 hours) at 09/01/2023 1322 Last data filed at 09/01/2023 8119 Gross per 24 hour  Intake 100 ml  Output --  Net 100 ml   Filed Weights   08/31/23 2125  Weight: 68 kg    Examination:  General exam: Appears calm and comfortable  Respiratory system: Decreased air entry with wheezing.   Cardiovascular system: S1 & S2 heard Gastrointestinal system: Abdomen is soft and nontender.   Central nervous system: Awake and alert.  Patient was all extremities.   Extremities: No leg edema.  Data Reviewed: I have personally reviewed following labs and imaging studies  CBC: Recent Labs  Lab 08/31/23 2130 09/01/23 0527  WBC 15.5* 12.5*  NEUTROABS 11.0*  --   HGB 14.7 13.5  HCT 44.6 41.1  MCV 99.1 98.1  PLT 443* 430*   Basic Metabolic Panel: Recent Labs  Lab 08/31/23 2320 09/01/23 0527  NA 139 138  K 4.1 4.5  CL 102 102  CO2 26 25  GLUCOSE 161* 180*  BUN 8 10  CREATININE 0.70 0.56  CALCIUM 9.3 8.5*   GFR: Estimated Creatinine Clearance: 57.2 mL/min (by C-G formula based on SCr of 0.56 mg/dL). Liver Function  Tests: Recent Labs  Lab 09/01/23 0527  AST 16  ALT 18  ALKPHOS 60  BILITOT 0.7  PROT 5.9*  ALBUMIN 3.0*   No results for input(s): "LIPASE", "AMYLASE" in the last 168 hours. No results for input(s): "AMMONIA" in the last 168 hours. Coagulation Profile: No results for input(s): "INR", "PROTIME" in the last 168 hours. Cardiac Enzymes: No results for input(s): "CKTOTAL", "CKMB", "CKMBINDEX", "TROPONINI" in the last 168 hours. BNP (last 3 results) No results for input(s): "PROBNP" in the last 8760 hours. HbA1C: No results for input(s): "HGBA1C" in the last 72 hours. CBG: Recent Labs  Lab 09/01/23 0417 09/01/23 1213  GLUCAP 170* 152*   Lipid Profile: No results for input(s): "CHOL", "HDL", "LDLCALC", "TRIG", "CHOLHDL", "LDLDIRECT" in the last 72 hours. Thyroid Function Tests: No results for input(s): "TSH", "T4TOTAL", "FREET4", "T3FREE", "THYROIDAB" in the last 72 hours. Anemia Panel: No results for input(s): "VITAMINB12", "FOLATE", "FERRITIN", "TIBC", "IRON", "RETICCTPCT" in the last 72 hours. Urine analysis:    Component Value Date/Time   COLORURINE STRAW (A) 12/16/2020 1542   APPEARANCEUR CLEAR 12/16/2020 1542   LABSPEC 1.005 12/16/2020 1542   PHURINE 5.0 12/16/2020 1542   GLUCOSEU NEGATIVE 12/16/2020 1542   HGBUR NEGATIVE 12/16/2020 1542   BILIRUBINUR NEGATIVE 12/16/2020 1542   BILIRUBINUR negative 11/24/2020 1018   KETONESUR NEGATIVE 12/16/2020 1542   PROTEINUR NEGATIVE 12/16/2020 1542   UROBILINOGEN 0.2 11/24/2020 1018   UROBILINOGEN 0.2 08/09/2014 1028   NITRITE NEGATIVE 12/16/2020 1542   LEUKOCYTESUR NEGATIVE 12/16/2020 1542   Sepsis Labs: @LABRCNTIP (procalcitonin:4,lacticidven:4)  ) Recent Results (from the past 240 hours)  Resp panel by RT-PCR (RSV, Flu A&B, Covid) Anterior Nasal Swab     Status: None   Collection Time: 08/31/23  9:32 PM   Specimen: Anterior Nasal Swab  Result Value Ref Range Status   SARS Coronavirus 2 by RT PCR NEGATIVE NEGATIVE  Final   Influenza A by PCR NEGATIVE NEGATIVE Final   Influenza B by PCR NEGATIVE NEGATIVE Final    Comment: (NOTE) The Xpert Xpress SARS-CoV-2/FLU/RSV plus assay is intended as an aid in the diagnosis of influenza from Nasopharyngeal swab specimens and should not be used as a sole basis for treatment. Nasal washings and aspirates are unacceptable for Xpert Xpress SARS-CoV-2/FLU/RSV testing.  Fact Sheet for Patients: BloggerCourse.com  Fact Sheet for Healthcare Providers: SeriousBroker.it  This test is not yet approved or cleared by the [place] FDA and has been authorized for detection and/or diagnosis of SARS-CoV-2 by FDA under an Emergency Use Authorization (EUA). This EUA will remain in effect (meaning this test can be used) for the duration of the COVID-19 declaration under Section 564(b)(1) of the Act, 21 U.S.C. section 360bbb-3(b)(1), unless the authorization is terminated or revoked.     Resp Syncytial Virus by PCR NEGATIVE NEGATIVE Final    Comment: (NOTE) Fact Sheet for Patients: BloggerCourse.com  Fact Sheet for Healthcare Providers: SeriousBroker.it  This test is not yet approved or cleared by the [place] FDA and has been authorized for detection and/or diagnosis of SARS-CoV-2 by FDA under an Emergency  Use Authorization (EUA). This EUA will remain in effect (meaning this test can be used) for the duration of the COVID-19 declaration under Section 564(b)(1) of the Act, 21 U.S.C. section 360bbb-3(b)(1), unless the authorization is terminated or revoked.  Performed at [facility], [address] [place], [place] [address]          Radiology Studies: DG Chest Portable 1 View Result Date: 08/31/2023 CLINICAL DATA:  Shortness of breath EXAM: PORTABLE CHEST 1 VIEW COMPARISON:  07/21/2023 FINDINGS: Cardiac shadow is within normal limits. Aortic  calcifications are noted. Lungs are well aerated bilaterally. No focal infiltrate is seen. Minimal left basilar atelectasis is noted. No bony abnormality is seen. Old left clavicular fracture is noted. IMPRESSION: Mild left basilar atelectasis. Electronically Signed   By: Alcide Clever M.D.   On: 08/31/2023 22:22        Scheduled Meds:  amLODipine  5 mg Oral Daily   atorvastatin  80 mg Oral Daily   budesonide (PULMICORT) nebulizer solution  0.25 mg Nebulization BID   busPIRone  30 mg Oral q AM   And   busPIRone  15 mg Oral Q1200   And   busPIRone  15 mg Oral QPM   clonazePAM  0.5 mg Oral BID   doxepin  75 mg Oral QHS   enoxaparin (LOVENOX) injection  40 mg Subcutaneous Q24H   escitalopram  20 mg Oral q AM   ezetimibe  10 mg Oral Daily   furosemide  20 mg Oral q AM   guaiFENesin  600 mg Oral BID   ipratropium-albuterol  3 mL Nebulization Q6H   levothyroxine  112 mcg Oral q AM   losartan  75 mg Oral Daily   methylPREDNISolone (SOLU-MEDROL) injection  40 mg Intravenous Q12H   montelukast  10 mg Oral QHS   nebivolol  10 mg Oral Daily   nicotine  21 mg Transdermal Daily   pantoprazole  40 mg Oral Daily   sodium chloride flush  3 mL Intravenous Q12H   ticagrelor  60 mg Oral BID   Continuous Infusions:  sodium chloride     cefTRIAXone (ROCEPHIN)  IV Stopped (09/01/23 0508)   doxycycline (VIBRAMYCIN) IV 100 mg (09/01/23 1036)     LOS: 0 days    Time spent: 35 minutes.    Berton Mount, MD  [facility] Pager #: [phone] 7PM-7AM contact night coverage as above

## 2023-09-01 NOTE — ED Provider Notes (Signed)
  Physical Exam  BP (!) 116/59   Pulse 85   Temp 97.8 F (36.6 C) (Oral)   Resp 17   Wt 68 kg   SpO2 100%   BMI 30.30 kg/m   Physical Exam Vitals and nursing note reviewed.  Constitutional:      General: She is not in acute distress.    Appearance: She is well-developed.  HENT:     Head: Normocephalic and atraumatic.  Eyes:     Conjunctiva/sclera: Conjunctivae normal.  Cardiovascular:     Rate and Rhythm: Normal rate and regular rhythm.     Heart sounds: No murmur heard. Pulmonary:     Effort: Pulmonary effort is normal. No respiratory distress.     Breath sounds: Wheezing present.  Abdominal:     Palpations: Abdomen is soft.     Tenderness: There is no abdominal tenderness.  Musculoskeletal:        General: No swelling.     Cervical back: Neck supple.  Skin:    General: Skin is warm and dry.     Capillary Refill: Capillary refill takes less than 2 seconds.  Neurological:     Mental Status: She is alert.  Psychiatric:        Mood and Affect: Mood normal.     Procedures  Procedures  ED Course / MDM    Medical Decision Making Amount and/or Complexity of Data Reviewed Labs: ordered. Radiology: ordered.  Risk Prescription drug management. Decision regarding hospitalization.   Patient received in handoff.  COPD exacerbation pending reevaluation after multiple DuoNebs and laboratory evaluation.  Laboratory evaluation with a leukocytosis to 15.5 likely secondary to steroid use prior to arrival, BNP 120, COVID, flu, RSV negative.  Chest x-ray with atelectasis but no focal consolidation.  On reevaluation, patient with continued to liter oxygen requirement and persistent wheezing.  Will require hospital admission for COPD exacerbation       Glendora Score, MD 09/01/23 0025

## 2023-09-01 NOTE — H&P (Addendum)
History and Physical    [name] [name] WJX:[id number] DOB: September 26, 1955 DOA: 08/31/2023  PCP: [name], MD   Patient coming from: Home   Chief Complaint:  Chief Complaint  Patient presents with   Respiratory Distress   ED TRIAGE note:  HPI:  [name] [name] is a 68 y.o. female with medical history significant of chronic smoker, COPD, chronic hypoxic respiratory failure 3 L oxygen at baseline, hypothyroidism, essential hypertension, generalized anxiety disorder, chronic depression, dyslipidemia, heart failure with preserved ejection fraction, history of CAD status post stenting, prediabetic, and GERD presented to emergency department complaining of cough, shortness of breath.  Patient reporting that she has been not feeling well for last few days.  Continues to having productive yellow sputum production.  Denies any hemoptysis.  Denies any lower extremity swelling, chest pain, chest pressure.  Reported that despite using home inhaler she continues to have shortness of breath.  Patient is endorsing nausea.  Reported continues to smoke 1 pack cigarettes in a day.  Patient denies any fever, chill, abdominal pain, vomitingand diarrhea   ED Course:  At presentation to ED patient is hemodynamically stable O2 sat 100% on 2 L oxygen. BMP unremarkable.  BNP 120. Respiratory panel negative for COVID, flu RSV. CBC showing a chronic leukocytosis.  Chest x-ray showing left basilar atelectasis.  In the ED patient has been treated with doxycycline.  DuoNeb x 2.  Hospitalist has been contacted for further treatment and management of COPD conservation.  Significant labs in the ED: Lab Orders         Resp panel by RT-PCR (RSV, Flu A&B, Covid) Anterior Nasal Swab         Culture, blood (routine x 2) Call MD if unable to obtain prior to antibiotics being given         Expectorated Sputum Assessment w Gram Stain, Rflx to Resp Cult         Respiratory (~20 pathogens) panel by PCR         CBC with  Differential         Brain natriuretic peptide         Basic metabolic panel         Legionella Pneumophila Serogp 1 Ur Ag         Strep pneumoniae urinary antigen         CBC         Comprehensive metabolic panel         Procalcitonin         Glucose, capillary         Hemoglobin A1c         Glucose, capillary         CBG monitoring, ED       Review of Systems:  Review of Systems  Constitutional:  Negative for chills, fever and weight loss.  Eyes:  Negative for blurred vision.  Respiratory:  Positive for cough, sputum production, shortness of breath and wheezing. Negative for hemoptysis.   Cardiovascular:  Negative for chest pain, palpitations, orthopnea and PND.  Gastrointestinal:  Positive for nausea. Negative for abdominal pain, constipation, diarrhea, heartburn and vomiting.  Musculoskeletal:  Negative for falls.  Neurological:  Negative for dizziness and headaches.  Psychiatric/Behavioral:  The patient is not nervous/anxious.   All other systems reviewed and are negative.   Past Medical History:  Diagnosis Date   Allergy    seasonal   Anemia    Anxiety    takes Lexapro daily  Arthritis    "back, from neck down pass my bra area" (03/25/2017)   Asthma    Bartholin gland cyst 08/29/2011   Bruises easily    pt is on Effient   Chronic back pain    herniated nucleus pulposus   Chronic back pain    "neck to bra area; lower back" (03/25/2017)   Chronic kidney disease    recurrent UTI's this year 2022   COPD (chronic obstructive pulmonary disease) (HCC)    early stages   Coronary artery disease    Depression    takes Klonopin daily   Diabetes mellitus without complication (HCC)    Diverticulosis    Fibroadenoma of left breast    GERD (gastroesophageal reflux disease)    takes Nexium daily   H/O hiatal hernia    Heart attack (HCC) 2011   Hemorrhoids    Hernia    Hyperlipidemia    takes Lipitor daily   Hypertension    takes Losartan daily and Labetalol bid    Hypothyroidism    takes Synthroid daily   Insomnia    hydroxyzine prn   Joint pain    Pneumonia    "couple times" (03/25/2017)   Pre-diabetes    "just found out 1 wk ago" (03/25/2017)   Psoriasis    elbows,knees,back   Shortness of breath    with exertion   Slowing of urinary stream    Stress incontinence     Past Surgical History:  Procedure Laterality Date   ABDOMINAL EXPLORATION SURGERY  1977   ABDOMINAL HYSTERECTOMY  1977   "left one of my ovaries"   BACK SURGERY     BIOPSY  05/27/2019   Procedure: BIOPSY;  Surgeon: Corbin Ade, MD;  Location: [facility];  Service: Endoscopy;;   BREAST BIOPSY Left 11/2021   x2   COLONOSCOPY  2011   Dr. Christella Hartigan: Mild diverticulosis, descending diminutive colon polyp (not retrieved), next colonoscopy 10 years   CORONARY ANGIOPLASTY WITH STENT PLACEMENT  2011 X2   "regular stents didn't work; had to go back in in ~ 1 month and put in medicated stents"   DILATION AND CURETTAGE OF UTERUS     ESOPHAGOGASTRODUODENOSCOPY     ESOPHAGOGASTRODUODENOSCOPY (EGD) WITH PROPOFOL N/A 05/27/2019   normal esophagus, dilation, erosive gastropathy s/p biopsy, normal duodenum. Negative H.pylori.    LEFT HEART CATH AND CORONARY ANGIOGRAPHY N/A 03/26/2017   Procedure: LEFT HEART CATH AND CORONARY ANGIOGRAPHY;  Surgeon: Lyn Records, MD;  Location: [facility];  Service: Cardiovascular;  Laterality: N/A;   LUMBAR LAMINECTOMY/DECOMPRESSION MICRODISCECTOMY  08/05/2011   Procedure: LUMBAR LAMINECTOMY/DECOMPRESSION MICRODISCECTOMY;  Surgeon: Clydene Fake, MD;  Location: [facility];  Service: Neurosurgery;  Laterality: Right;  Right Lumbar four-five extraforaminal discectomy   MALONEY DILATION N/A 05/27/2019   Procedure: Elease Hashimoto DILATION;  Surgeon: Corbin Ade, MD;  Location: [facility];  Service: Endoscopy;  Laterality: N/A;  54   SHOULDER ARTHROSCOPY WITH ROTATOR CUFF REPAIR AND OPEN BICEPS TENODESIS Right 12/24/2021   Procedure: RIGHT  SHOULDER ARTHROSCOPY WITH ROTATOR CUFF REPAIR AND BICEPS TENODESIS;  Surgeon: Eldred Manges, MD;  Location: [facility] OR;  Service: Orthopedics;  Laterality: Right;   TONSILLECTOMY     as a child   UPPER GASTROINTESTINAL ENDOSCOPY       reports that she has been smoking cigarettes. She has a 25 pack-year smoking history. She has never used smokeless tobacco. She reports current alcohol use. She reports current drug use.  Drug: Marijuana.  Allergies  Allergen Reactions   Avocado Anaphylaxis   Latex Shortness Of Breath and Rash   Codeine Nausea Only    Family History  Problem Relation Age of Onset   Heart disease Mother    Hypertension Mother    Stroke Mother    Mental illness Mother    Heart disease Father    Hypertension Father    Diabetes Father    Colon polyps Brother    Heart disease Maternal Grandfather    Cancer Paternal Grandmother        mouth   Anesthesia problems Daughter    Breast cancer Maternal Aunt    Throat cancer Maternal Uncle    Thyroid disease Paternal Aunt    Hypotension Neg Hx    Malignant hyperthermia Neg Hx    Pseudochol deficiency Neg Hx    Colon cancer Neg Hx    Stomach cancer Neg Hx    Esophageal cancer Neg Hx    Pancreatic cancer Neg Hx    Rectal cancer Neg Hx     Prior to Admission medications   Medication Sig Start Date End Date Taking? Authorizing Provider  albuterol (VENTOLIN HFA) 108 (90 Base) MCG/ACT inhaler INHALE 2 PUFFS INTO THE LUNGS EVERY 6 HOURS AS NEEDED FOR WHEEZING OR SHORTNESS OF BREATH 07/18/23   [name], MD  alendronate (FOSAMAX) 35 MG tablet Take 1 tablet (35 mg total) by mouth every 7 (seven) days. Take with a full glass of water on an empty stomach. Sit upright for 30-60 mins after taking Patient taking differently: Take 35 mg by mouth every Wednesday. 02/27/23   [name], MD  amLODipine (NORVASC) 5 MG tablet Take 1 tablet (5 mg total) by mouth daily. Patient taking differently: Take 5 mg by mouth in the morning.  09/18/22   Joylene Grapes, NP  amoxicillin-clavulanate (AUGMENTIN) 875-125 MG tablet Take 1 tablet by mouth every 12 (twelve) hours for 9 doses. 07/15/23 07/20/23  Annett Fabian, MD  aspirin EC 81 MG tablet Take 81 mg by mouth in the morning.    [provider]  atorvastatin (LIPITOR) 80 MG tablet Take 1 tablet (80 mg total) by mouth daily, please schedule appointment for more refills 09/18/22   Joylene Grapes, NP  azithromycin (ZITHROMAX) 250 MG tablet Take 2 tablets (500 mg total) by mouth daily for 1 day, THEN 1 tablet (250 mg total) daily for 4 days. 08/07/23 08/12/23  Martina Sinner, MD  Blood Glucose Monitoring Suppl (TRUE METRIX METER) DEVI 1 kit by Does not apply route 4 (four) times daily. 04/04/17   Vivianne Master, PA-C  busPIRone (BUSPAR) 15 MG tablet Take 2 tablets (30 mg total) by mouth in the morning AND 1 tablet (15 mg total) daily at 12 noon AND 1 tablet (15 mg total) every evening. Patient taking differently: Take 2 tablets (30 mg total) by mouth in the morning AND 1 tablet (15 mg total) daily at 12 noon AND 1 tablet (15 mg total) every evening. (Patient takes 4 tabs every night at bedtime) 06/27/23   Nwoko, Tommas Olp, PA  clonazePAM (KLONOPIN) 0.5 MG disintegrating tablet Take 1 tablet (0.5 mg total) by mouth 2 (two) times daily. 07/23/23   Faith Rogue, DO  doxepin (SINEQUAN) 75 MG capsule Take 1 capsule (75 mg total) by mouth at bedtime. 06/27/23   Nwoko, Tommas Olp, PA  escitalopram (LEXAPRO) 20 MG tablet Take 1 tablet (20 mg total) by mouth daily. Patient taking  differently: Take 20 mg by mouth in the morning. 06/27/23   Nwoko, Tommas Olp, PA  ezetimibe (ZETIA) 10 MG tablet Take 1 tablet (10 mg total) by mouth daily. Patient taking differently: Take 10 mg by mouth at bedtime. 07/04/23   Azalee Course, PA  Fluticasone-Umeclidin-Vilant (TRELEGY ELLIPTA) 200-62.5-25 MCG/ACT AEPB Inhale 1 puff into the lungs daily. 08/07/23   Martina Sinner, MD  furosemide (LASIX) 20 MG tablet  Take 1 tablet (20 mg total) by mouth daily. Patient taking differently: Take 20 mg by mouth in the morning. 09/18/22   Joylene Grapes, NP  gabapentin (NEURONTIN) 600 MG tablet TAKE 1 TABLET BY MOUTH 4 TIMES DAILY Patient taking differently: Take 600 mg by mouth 2 (two) times daily. 12/20/22   [name], MD  glucose blood (TRUE METRIX BLOOD GLUCOSE TEST) test strip Use as instructed 01/21/18   [name], MD  ipratropium-albuterol (DUONEB) 0.5-2.5 (3) MG/3ML SOLN Take 3 mLs by nebulization every 6 (six) hours as needed. 07/23/23   Faith Rogue, DO  levothyroxine (SYNTHROID) 112 MCG tablet Take 1 tablet (112 mcg total) by mouth daily. Patient taking differently: Take 112 mcg by mouth in the morning. 12/20/22   [name], MD  losartan (COZAAR) 50 MG tablet Take 1.5 tablets (75 mg total) by mouth daily. Patient taking differently: Take 75 mg by mouth in the morning. 09/18/22   Joylene Grapes, NP  metFORMIN (GLUCOPHAGE) 500 MG tablet Take 1 tablet (500 mg total) by mouth 2 (two) times daily with a meal. Patient taking differently: Take 500 mg by mouth in the morning. 04/23/23   [name], MD  mirtazapine (REMERON) 45 MG tablet Take 1 tablet (45 mg total) by mouth at bedtime. 06/27/23   Nwoko, Tommas Olp, PA  montelukast (SINGULAIR) 10 MG tablet Take 1 tablet (10 mg total) by mouth at bedtime. 12/20/22   [name], MD  nebivolol (BYSTOLIC) 10 MG tablet Take 1 tablet (10 mg total) by mouth daily. Patient taking differently: Take 10 mg by mouth at bedtime. 09/18/22   Joylene Grapes, NP  nitroGLYCERIN (NITROSTAT) 0.4 MG SL tablet Place 1 tablet (0.4 mg total) under the tongue every 5 (five) minutes as needed for chest pain. Patient not taking: Reported on 07/21/2023 09/18/22 07/26/23  Joylene Grapes, NP  omeprazole (PRILOSEC) 40 MG capsule Take 1 capsule (40 mg total) by mouth 2 (two) times daily before a meal. 02/19/23   Lemmon, Violet Baldy, PA  predniSONE (DELTASONE) 10 MG tablet  Take 4 tablets (40 mg total) by mouth daily with breakfast for 3 days, THEN 3 tablets (30 mg total) daily with breakfast for 3 days, THEN 2 tablets (20 mg total) daily with breakfast for 3 days, THEN 1 tablet (10 mg total) daily with breakfast for 3 days. 08/19/23 09/01/23  Martina Sinner, MD  Spacer/Aero-Holding Rudean Curt Use with the albuterol inhaler. 07/22/23   Marrianne Mood, MD  ticagrelor (BRILINTA) 60 MG TABS tablet Take 1 tablet (60 mg total) by mouth 2 (two) times daily. 09/18/22   Joylene Grapes, NP  triamcinolone (KENALOG) 0.025 % ointment Apply 1 Application topically 2 (two) times daily as needed. 01/17/23   [name], MD  TRUEPLUS LANCETS 28G MISC 28 g by Does not apply route QID. 04/04/17   Vivianne Master, PA-C  Zoster Vaccine Adjuvanted [facility]) injection Inject 0.5 mLs into the muscle once for 1 dose. 05/20/23 05/20/23  [name], MD  Physical Exam: Vitals:   09/01/23 0806 09/01/23 1104 09/01/23 1521 09/01/23 1600  BP: 131/67  131/67 (!) 142/71  Pulse: 77  77 79  Resp: 16  16   Temp: 97.8 F (36.6 C)  97.8 F (36.6 C)   TempSrc: Oral  Oral   SpO2: 98% 96%  98%  Weight:   68 kg   Height:   5\' 1"  (1.549 m)     Physical Exam Vitals and nursing note reviewed.  Constitutional:      Appearance: She is not ill-appearing.  HENT:     Nose: Nose normal.     Mouth/Throat:     Mouth: Mucous membranes are moist.  Eyes:     Conjunctiva/sclera: Conjunctivae normal.  Cardiovascular:     Rate and Rhythm: Normal rate and regular rhythm.     Pulses: Normal pulses.     Heart sounds: Normal heart sounds.  Pulmonary:     Effort: Pulmonary effort is normal. No respiratory distress.     Breath sounds: No stridor. Wheezing present. No rhonchi or rales.  Abdominal:     General: Bowel sounds are normal.  Musculoskeletal:     Cervical back: Neck supple.     Right lower leg: No edema.     Left lower leg: No edema.  Skin:    Capillary Refill: Capillary  refill takes less than 2 seconds.  Neurological:     Mental Status: She is alert and oriented to person, place, and time.  Psychiatric:        Mood and Affect: Mood normal.      Labs on Admission: I have personally reviewed following labs and imaging studies  CBC: Recent Labs  Lab 08/31/23 2130 09/01/23 0527  WBC 15.5* 12.5*  NEUTROABS 11.0*  --   HGB 14.7 13.5  HCT 44.6 41.1  MCV 99.1 98.1  PLT 443* 430*   Basic Metabolic Panel: Recent Labs  Lab 08/31/23 2320 09/01/23 0527  NA 139 138  K 4.1 4.5  CL 102 102  CO2 26 25  GLUCOSE 161* 180*  BUN 8 10  CREATININE 0.70 0.56  CALCIUM 9.3 8.5*   GFR: Estimated Creatinine Clearance: 60.2 mL/min (by C-G formula based on SCr of 0.56 mg/dL). Liver Function Tests: Recent Labs  Lab 09/01/23 0527  AST 16  ALT 18  ALKPHOS 60  BILITOT 0.7  PROT 5.9*  ALBUMIN 3.0*   No results for input(s): "LIPASE", "AMYLASE" in the last 168 hours. No results for input(s): "AMMONIA" in the last 168 hours. Coagulation Profile: No results for input(s): "INR", "PROTIME" in the last 168 hours. Cardiac Enzymes: No results for input(s): "CKTOTAL", "CKMB", "CKMBINDEX", "TROPONINI", "TROPONINIHS" in the last 168 hours. BNP (last 3 results) Recent Labs    07/12/23 0800 07/21/23 1755 08/31/23 2320  BNP 20.0 182.4* 120.0*   HbA1C: Recent Labs    09/01/23 0527  HGBA1C 6.2*   CBG: Recent Labs  Lab 09/01/23 0417 09/01/23 1213 09/01/23 1655  GLUCAP 170* 152* 174*   Lipid Profile: No results for input(s): "CHOL", "HDL", "LDLCALC", "TRIG", "CHOLHDL", "LDLDIRECT" in the last 72 hours. Thyroid Function Tests: No results for input(s): "TSH", "T4TOTAL", "FREET4", "T3FREE", "THYROIDAB" in the last 72 hours. Anemia Panel: No results for input(s): "VITAMINB12", "FOLATE", "FERRITIN", "TIBC", "IRON", "RETICCTPCT" in the last 72 hours. Urine analysis:    Component Value Date/Time   COLORURINE STRAW (A) 12/16/2020 1542   APPEARANCEUR CLEAR  12/16/2020 1542   LABSPEC 1.005 12/16/2020 1542   PHURINE 5.0  12/16/2020 1542   GLUCOSEU NEGATIVE 12/16/2020 1542   HGBUR NEGATIVE 12/16/2020 1542   BILIRUBINUR NEGATIVE 12/16/2020 1542   BILIRUBINUR negative 11/24/2020 1018   KETONESUR NEGATIVE 12/16/2020 1542   PROTEINUR NEGATIVE 12/16/2020 1542   UROBILINOGEN 0.2 11/24/2020 1018   UROBILINOGEN 0.2 08/09/2014 1028   NITRITE NEGATIVE 12/16/2020 1542   LEUKOCYTESUR NEGATIVE 12/16/2020 1542    Radiological Exams on Admission: I have personally reviewed images DG Chest Portable 1 View Result Date: 08/31/2023 CLINICAL DATA:  Shortness of breath EXAM: PORTABLE CHEST 1 VIEW COMPARISON:  07/21/2023 FINDINGS: Cardiac shadow is within normal limits. Aortic calcifications are noted. Lungs are well aerated bilaterally. No focal infiltrate is seen. Minimal left basilar atelectasis is noted. No bony abnormality is seen. Old left clavicular fracture is noted. IMPRESSION: Mild left basilar atelectasis. Electronically Signed   By: Alcide Clever M.D.   On: 08/31/2023 22:22     EKG: My personal interpretation of EKG shows: EKG showing sinus rhythm heart rate 84.  There is no ST anterior abnormality.  Borderline right axis deviation.    Assessment/Plan: Principal Problem:   COPD with acute exacerbation (HCC) Active Problems:   Chronic hypoxic respiratory failure (HCC)   Hypothyroidism   Chronic depression   Essential hypertension   Hyperlipidemia   GAD (generalized anxiety disorder)   Continuous dependence on cigarette smoking   History of CAD (coronary artery disease)   CAP (community acquired pneumonia)    Assessment and Plan: COPD exacerbation History of chronic COPD Community-acquired pneumonia History of chronic hypoxic respiratory failure 2 to 3 L oxygen at baseline -Patient presenting with complaining of shortness of of breath and dyspnea which has not been improving nebulizer treatment at home.  Patient has been currently on  prednisone taper dose without any improvement of her symptoms.  Patient reported she continues to smoke. - EMS found patient O2 sat 89% room air.  Given albuterol nebulizer 50 mg, 125 mg Solu-Medrol.  In the ED patient is hemodynamically stable.  O2 sat 100% on 2 L oxygen.  While in the ED patient received DuoNeb nebulizer treatment x 2 and doxycycline 100 mg. -Respiratory panel negative for flu, COVID RSV.  Checking 20 respiratory panel. -Patient is afebrile.  CBC showing leukocytosis likely secondary to in the setting of prednisone use at home. -Chest x-ray showed left basilar atelectasis/infiltrate. - Checking blood culture, sputum culture, and 20 respiratory panel. - Checking procalcitonin level. -Continue IV ceftriaxone and doxycycline. -If procalcitonin is negative can discontinue the IV antibiotics. - Continue to treat for COPD exacerbation with Solu-Medrol 40 mg IV twice daily, continue Breo Ellipta once daily, incurse Ellipta once daily, DuoNeb every 4 hours for wheezing, shortness of breath,: Continue pulmonary toiletry, flutter valve, supplemental oxygen and supportive care.   History of CAD -Continue Lipitor 80 mg daily, nebivolol 10 mg daily and Brilinta 60 mg twice daily.  Essential hypertension Initially patient blood pressure found up to upper 170s range.  Currently blood pressure has been improved. - Continue home regimens include amlodipine 5 mg, Lasix 20 mg, losartan 75 mg and nebivolol 10 mg.  History of prolonged QT prolongation - Continue to avoid any QT prolonging medication.  Hyperlipidemia - Continue Lipitor and ezetimibe.   Hypothyroidism - Continue levo hyroxine  Chronic smoker - Continue to smoking 1 pack cigarette in a day.  -Counseled patient at the bedside for smoking cessation. - Continue nicotine gum as needed.   Generalized anxiety disorder - Continue BuSpar 30 mg in the morning, 50  mg in the afternoon and 50 mg in the evening. -Continue Klonopin  0.5 mg twice daily. - Continue Seroquel 75 mg at bedtime.  Insomnia -Holding Remeron in the setting of COPD exacerbation as risk for development of drowsiness/sleepiness which can lead to hypoxic and hypercapnic respiratory failure.  Prediabetic/glucose intolerance History of prediabetes - Holding metformin in acute hospital setting - Continue check POC blood glucose with meal at bedtime given patient is on prednisone at home and currently on IV Solu-Medrol there is risk for development of hyperglycemia. If blood glucose will start to trend high  will start subcu insulin.    DVT prophylaxis:  Lovenox Code Status:  Full Code Diet: Heart healthy carb modified diet Family Communication:   Family was present at bedside, at the time of interview. Opportunity was given to ask question and all questions were answered satisfactorily.  Disposition Plan: Will follow-up with procalcitonin, blood culture and sputum culture results to decide continue versus stop the antibiotic.  Once patient wheezing will be improved can DC to home with oral prednisone Consults: Respiratory care Admission status:   Inpatient, Telemetry bed  Severity of Illness: The appropriate patient status for this patient is INPATIENT. Inpatient status is judged to be reasonable and necessary in order to provide the required intensity of service to ensure the patient's safety. The patient's presenting symptoms, physical exam findings, and initial radiographic and laboratory data in the context of their chronic comorbidities is felt to place them at high risk for further clinical deterioration. Furthermore, it is not anticipated that the patient will be medically stable for discharge from the hospital within 2 midnights of admission.   * I certify that at the point of admission it is my clinical judgment that the patient will require inpatient hospital care spanning beyond 2 midnights from the point of admission due to high intensity  of service, high risk for further deterioration and high frequency of surveillance required.Marland Kitchen    Tereasa Coop, MD [facility]  How to contact the [facility] Attending or Consulting provider 7A - 7P or covering provider during after hours 7P -7A, for this patient.  Check the care team in [facility] and look for a) attending/consulting [facility] provider listed and b) the [facility] team listed Log into [email or web address] and use 's universal password to access. If you do not have the password, please contact the hospital operator. Locate the [facility] provider you are looking for under [facility] and page to a number that you can be directly reached. If you still have difficulty reaching the provider, please page the [facility] (Director on Call) for the Hospitalists listed on amion for assistance.  09/01/2023, 6:48 PM

## 2023-09-01 NOTE — Plan of Care (Signed)
Problem: Education: Goal: Knowledge of General Education information will improve Description: Including pain rating scale, medication(s)/side effects and non-pharmacologic comfort measures Outcome: Progressing   Problem: Health Behavior/Discharge Planning: Goal: Ability to manage health-related needs will improve Outcome: Progressing   Problem: Clinical Measurements: Goal: Ability to maintain clinical measurements within normal limits will improve Outcome: Progressing Goal: Will remain free from infection Outcome: Progressing Goal: Diagnostic test results will improve Outcome: Progressing Goal: Respiratory complications will improve Outcome: Progressing Goal: Cardiovascular complication will be avoided Outcome: Progressing   Problem: Activity: Goal: Risk for activity intolerance will decrease Outcome: Progressing   Problem: Nutrition: Goal: Adequate nutrition will be maintained Outcome: Progressing   Problem: Coping: Goal: Level of anxiety will decrease Outcome: Progressing   Problem: Elimination: Goal: Will not experience complications related to bowel motility Outcome: Progressing Goal: Will not experience complications related to urinary retention Outcome: Progressing   Problem: Pain Managment: Goal: General experience of comfort will improve and/or be controlled Outcome: Progressing   Problem: Safety: Goal: Ability to remain free from injury will improve Outcome: Progressing   Problem: Skin Integrity: Goal: Risk for impaired skin integrity will decrease Outcome: Progressing   Problem: Education: Goal: Knowledge of disease or condition will improve Outcome: Progressing Goal: Knowledge of the prescribed therapeutic regimen will improve Outcome: Progressing Goal: Individualized Educational Video(s) Outcome: Progressing   Problem: Activity: Goal: Ability to tolerate increased activity will improve Outcome: Progressing Goal: Will verbalize the  importance of balancing activity with adequate rest periods Outcome: Progressing   Problem: Respiratory: Goal: Ability to maintain a clear airway will improve Outcome: Progressing Goal: Levels of oxygenation will improve Outcome: Progressing Goal: Ability to maintain adequate ventilation will improve Outcome: Progressing   Problem: Activity: Goal: Ability to tolerate increased activity will improve Outcome: Progressing   Problem: Clinical Measurements: Goal: Ability to maintain a body temperature in the normal range will improve Outcome: Progressing   Problem: Respiratory: Goal: Ability to maintain adequate ventilation will improve Outcome: Progressing Goal: Ability to maintain a clear airway will improve Outcome: Progressing   Problem: Education: Goal: Knowledge of General Education information will improve Description: Including pain rating scale, medication(s)/side effects and non-pharmacologic comfort measures Outcome: Progressing   Problem: Health Behavior/Discharge Planning: Goal: Ability to manage health-related needs will improve Outcome: Progressing   Problem: Clinical Measurements: Goal: Ability to maintain clinical measurements within normal limits will improve Outcome: Progressing Goal: Will remain free from infection Outcome: Progressing Goal: Diagnostic test results will improve Outcome: Progressing Goal: Respiratory complications will improve Outcome: Progressing Goal: Cardiovascular complication will be avoided Outcome: Progressing   Problem: Activity: Goal: Risk for activity intolerance will decrease Outcome: Progressing   Problem: Nutrition: Goal: Adequate nutrition will be maintained Outcome: Progressing   Problem: Coping: Goal: Level of anxiety will decrease Outcome: Progressing   Problem: Elimination: Goal: Will not experience complications related to bowel motility Outcome: Progressing Goal: Will not experience complications related  to urinary retention Outcome: Progressing   Problem: Pain Managment: Goal: General experience of comfort will improve and/or be controlled Outcome: Progressing   Problem: Safety: Goal: Ability to remain free from injury will improve Outcome: Progressing   Problem: Skin Integrity: Goal: Risk for impaired skin integrity will decrease Outcome: Progressing   Problem: Education: Goal: Ability to describe self-care measures that may prevent or decrease complications (Diabetes Survival Skills Education) will improve Outcome: Progressing Goal: Individualized Educational Video(s) Outcome: Progressing   Problem: Coping: Goal: Ability to adjust to condition or change in health will improve Outcome: Progressing  Problem: Fluid Volume: Goal: Ability to maintain a balanced intake and output will improve Outcome: Progressing

## 2023-09-02 ENCOUNTER — Encounter: Payer: Self-pay | Admitting: Hematology

## 2023-09-02 DIAGNOSIS — J441 Chronic obstructive pulmonary disease with (acute) exacerbation: Secondary | ICD-10-CM | POA: Diagnosis not present

## 2023-09-02 LAB — GLUCOSE, CAPILLARY
Glucose-Capillary: 198 mg/dL — ABNORMAL HIGH (ref 70–99)
Glucose-Capillary: 184 mg/dL — ABNORMAL HIGH (ref 70–99)
Glucose-Capillary: 143 mg/dL — ABNORMAL HIGH (ref 70–99)
Glucose-Capillary: 146 mg/dL — ABNORMAL HIGH (ref 70–99)

## 2023-09-02 MED ORDER — DOXYCYCLINE HYCLATE 100 MG PO TABS
100.0000 mg | ORAL_TABLET | Freq: Two times a day (BID) | ORAL | Status: DC
  Administered 2023-09-02 – 2023-09-05 (×6): 100 mg via ORAL
  Filled 2023-09-02 (×6): qty 1

## 2023-09-02 NOTE — Progress Notes (Signed)
PROGRESS NOTE    [name] [name]  QIO:[id number] DOB: 08/04/1955 DOA: 08/31/2023 PCP: Martina Sinner, MD  Outpatient Specialists:     Brief Narrative:  Patient is a 68 year old female, with past medical history significant for COPD, continuous tobacco abuse since the age of 30 (patient continues to smoke 1 pack of cigarettes daily), chronic respiratory failure on 2 L of supplemental oxygen via nasal cannula at home, hypothyroidism, hypertension, dyslipidemia, anxiety and depression.  Patient was admitted with COPD exacerbation.  Apparently, symptoms started about a week ago with wheezing, worsening shortness of breath and cough that is productive of whitish sputum.  09/02/2023: Patient seen.  Patient continues to wheeze.  No fever or chills.   Assessment & Plan:   Principal Problem:   COPD with acute exacerbation (HCC) Active Problems:   Hypothyroidism   Chronic depression   Essential hypertension   Hyperlipidemia   GAD (generalized anxiety disorder)   Chronic hypoxic respiratory failure (HCC)   Continuous dependence on cigarette smoking   History of CAD (coronary artery disease)   CAP (community acquired pneumonia)   COPD exacerbation History of chronic COPD Comment echo pneumonia History of chronic hypoxic respiratory failure 2 to 3 L oxygen at baseline -Nebs DuoNeb, nebs albuterol as needed, IV Solu-Medrol and nebs Pulmicort. -Continue supplemental oxygen. -Patient was counseled to quit tobacco use.    History of CAD -Stable.  No chest pain. -Continue Lipitor 80 mg daily, nebivolol 10 mg daily and Brilinta 60 mg twice daily.   Essential hypertension -Blood pressure is controlled. - Continue home regimens include amlodipine 5 mg, Lasix 20 mg, losartan 75 mg and nebivolol 10 mg.   History of prolonged QT prolongation - Continue to avoid any QT prolonging medication.   Hyperlipidemia - Continue Lipitor and ezetimibe.     Hypothyroidism - Continue levo  hyroxine   Chronic smoker -Patient has smoked cigarettes since age of 89. -Patient currently smokes 1 pack of cigarettes daily. -Counseled to quit tobacco use.     Generalized anxiety disorder -Stable.   Insomnia   Prediabetic/glucose intolerance History of prediabetes -Sliding scale insulin coverage.  DVT prophylaxis: Subcutaneous Lovenox. Code Status: Full code. Family Communication:  Disposition Plan: Patient remains inpatient.   Consultants:  None.  Procedures:  None.  Antimicrobials:  Doxycycline   Subjective: Shortness of breath and wheezing. Productive cough.  Objective: Vitals:   09/02/23 0454 09/02/23 0827 09/02/23 1410 09/02/23 1619  BP: 138/76 129/74  123/62  Pulse: 78 77 78 71  Resp: 17 17 18 17   Temp: 98.1 F (36.7 C) 97.8 F (36.6 C)  100.2 F (37.9 C)  TempSrc: Oral Oral  Oral  SpO2: 98% 97% 97% 97%  Weight:      Height:        Intake/Output Summary (Last 24 hours) at 09/02/2023 1812 Last data filed at 09/02/2023 1600 Gross per 24 hour  Intake 360 ml  Output --  Net 360 ml   Filed Weights   08/31/23 2125 09/01/23 1521  Weight: 68 kg 68 kg    Examination:  General exam: Appears calm and comfortable  Respiratory system: Decreased air entry with wheezing.   Cardiovascular system: S1 & S2 heard Gastrointestinal system: Abdomen is soft and nontender.   Central nervous system: Awake and alert.  Patient was all extremities.   Extremities: No leg edema.  Data Reviewed: I have personally reviewed following labs and imaging studies  CBC: Recent Labs  Lab 08/31/23 2130 09/01/23 0527  WBC 15.5*  12.5*  NEUTROABS 11.0*  --   HGB 14.7 13.5  HCT 44.6 41.1  MCV 99.1 98.1  PLT 443* 430*   Basic Metabolic Panel: Recent Labs  Lab 08/31/23 2320 09/01/23 0527  NA 139 138  K 4.1 4.5  CL 102 102  CO2 26 25  GLUCOSE 161* 180*  BUN 8 10  CREATININE 0.70 0.56  CALCIUM 9.3 8.5*   GFR: Estimated Creatinine Clearance: 60.2 mL/min  (by C-G formula based on SCr of 0.56 mg/dL). Liver Function Tests: Recent Labs  Lab 09/01/23 0527  AST 16  ALT 18  ALKPHOS 60  BILITOT 0.7  PROT 5.9*  ALBUMIN 3.0*   No results for input(s): "LIPASE", "AMYLASE" in the last 168 hours. No results for input(s): "AMMONIA" in the last 168 hours. Coagulation Profile: No results for input(s): "INR", "PROTIME" in the last 168 hours. Cardiac Enzymes: No results for input(s): "CKTOTAL", "CKMB", "CKMBINDEX", "TROPONINI" in the last 168 hours. BNP (last 3 results) No results for input(s): "PROBNP" in the last 8760 hours. HbA1C: Recent Labs    09/01/23 0527  HGBA1C 6.2*   CBG: Recent Labs  Lab 09/01/23 1655 09/01/23 2024 09/02/23 0823 09/02/23 1155 09/02/23 1613  GLUCAP 174* 127* 198* 184* 143*   Lipid Profile: No results for input(s): "CHOL", "HDL", "LDLCALC", "TRIG", "CHOLHDL", "LDLDIRECT" in the last 72 hours. Thyroid Function Tests: No results for input(s): "TSH", "T4TOTAL", "FREET4", "T3FREE", "THYROIDAB" in the last 72 hours. Anemia Panel: No results for input(s): "VITAMINB12", "FOLATE", "FERRITIN", "TIBC", "IRON", "RETICCTPCT" in the last 72 hours. Urine analysis:    Component Value Date/Time   COLORURINE STRAW (A) 12/16/2020 1542   APPEARANCEUR CLEAR 12/16/2020 1542   LABSPEC 1.005 12/16/2020 1542   PHURINE 5.0 12/16/2020 1542   GLUCOSEU NEGATIVE 12/16/2020 1542   HGBUR NEGATIVE 12/16/2020 1542   BILIRUBINUR NEGATIVE 12/16/2020 1542   BILIRUBINUR negative 11/24/2020 1018   KETONESUR NEGATIVE 12/16/2020 1542   PROTEINUR NEGATIVE 12/16/2020 1542   UROBILINOGEN 0.2 11/24/2020 1018   UROBILINOGEN 0.2 08/09/2014 1028   NITRITE NEGATIVE 12/16/2020 1542   LEUKOCYTESUR NEGATIVE 12/16/2020 1542   Sepsis Labs: @LABRCNTIP (procalcitonin:4,lacticidven:4)  ) Recent Results (from the past 240 hours)  Culture, blood (routine x 2) Call MD if unable to obtain prior to antibiotics being given     Status: None (Preliminary  result)   Collection Time: 08/31/23  9:28 PM   Specimen: BLOOD  Result Value Ref Range Status   Specimen Description BLOOD RIGHT ANTECUBITAL  Final   Special Requests   Final    BOTTLES DRAWN AEROBIC AND ANAEROBIC Blood Culture adequate volume   Culture   Final    NO GROWTH 1 DAY Performed at [facility], [address], [place], [place] [address]    Report Status PENDING  Incomplete  Resp panel by RT-PCR (RSV, Flu A&B, Covid) Anterior Nasal Swab     Status: None   Collection Time: 08/31/23  9:32 PM   Specimen: Anterior Nasal Swab  Result Value Ref Range Status   SARS Coronavirus 2 by RT PCR NEGATIVE NEGATIVE Final   Influenza A by PCR NEGATIVE NEGATIVE Final   Influenza B by PCR NEGATIVE NEGATIVE Final    Comment: (NOTE) The Xpert Xpress SARS-CoV-2/FLU/RSV plus assay is intended as an aid in the diagnosis of influenza from Nasopharyngeal swab specimens and should not be used as a sole basis for treatment. Nasal washings and aspirates are unacceptable for Xpert Xpress SARS-CoV-2/FLU/RSV testing.  Fact Sheet for Patients: BloggerCourse.com  Fact Sheet for Healthcare Providers: SeriousBroker.it  This test is not yet approved or cleared by the [place] FDA and has been authorized for detection and/or diagnosis of SARS-CoV-2 by FDA under an Emergency Use Authorization (EUA). This EUA will remain in effect (meaning this test can be used) for the duration of the COVID-19 declaration under Section 564(b)(1) of the Act, 21 U.S.C. section 360bbb-3(b)(1), unless the authorization is terminated or revoked.     Resp Syncytial Virus by PCR NEGATIVE NEGATIVE Final    Comment: (NOTE) Fact Sheet for Patients: BloggerCourse.com  Fact Sheet for Healthcare Providers: SeriousBroker.it  This test is not yet approved or cleared by the [place] FDA and has been authorized for  detection and/or diagnosis of SARS-CoV-2 by FDA under an Emergency Use Authorization (EUA). This EUA will remain in effect (meaning this test can be used) for the duration of the COVID-19 declaration under Section 564(b)(1) of the Act, 21 U.S.C. section 360bbb-3(b)(1), unless the authorization is terminated or revoked.  Performed at [facility], [address], [place], [place] [address]   Respiratory (~20 pathogens) panel by PCR     Status: None   Collection Time: 09/01/23 12:53 AM   Specimen: Nasopharyngeal Swab; Respiratory  Result Value Ref Range Status   Adenovirus NOT DETECTED NOT DETECTED Final   Coronavirus 229E NOT DETECTED NOT DETECTED Final    Comment: (NOTE) The Coronavirus on the Respiratory Panel, DOES NOT test for the novel  Coronavirus (2019 nCoV)    Coronavirus HKU1 NOT DETECTED NOT DETECTED Final   Coronavirus NL63 NOT DETECTED NOT DETECTED Final   Coronavirus OC43 NOT DETECTED NOT DETECTED Final   Metapneumovirus NOT DETECTED NOT DETECTED Final   Rhinovirus / Enterovirus NOT DETECTED NOT DETECTED Final   Influenza A NOT DETECTED NOT DETECTED Final   Influenza B NOT DETECTED NOT DETECTED Final   Parainfluenza Virus 1 NOT DETECTED NOT DETECTED Final   Parainfluenza Virus 2 NOT DETECTED NOT DETECTED Final   Parainfluenza Virus 3 NOT DETECTED NOT DETECTED Final   Parainfluenza Virus 4 NOT DETECTED NOT DETECTED Final   Respiratory Syncytial Virus NOT DETECTED NOT DETECTED Final   Bordetella pertussis NOT DETECTED NOT DETECTED Final   Bordetella Parapertussis NOT DETECTED NOT DETECTED Final   Chlamydophila pneumoniae NOT DETECTED NOT DETECTED Final   Mycoplasma pneumoniae NOT DETECTED NOT DETECTED Final    Comment: Performed at [facility], [address], [place], [place] [address]  Culture, blood (routine x 2) Call MD if unable to obtain prior to antibiotics being given     Status: None (Preliminary result)   Collection Time: 09/01/23  4:10 AM    Specimen: BLOOD  Result Value Ref Range Status   Specimen Description BLOOD LEFT ANTECUBITAL  Final   Special Requests   Final    BOTTLES DRAWN AEROBIC AND ANAEROBIC Blood Culture adequate volume   Culture   Final    NO GROWTH 1 DAY Performed at [facility], [address] [place], [place] [address]    Report Status PENDING  Incomplete         Radiology Studies: DG Chest Portable 1 View Result Date: 08/31/2023 CLINICAL DATA:  Shortness of breath EXAM: PORTABLE CHEST 1 VIEW COMPARISON:  07/21/2023 FINDINGS: Cardiac shadow is within normal limits. Aortic calcifications are noted. Lungs are well aerated bilaterally. No focal infiltrate is seen. Minimal left basilar atelectasis is noted. No bony abnormality is seen. Old left clavicular fracture is noted.  IMPRESSION: Mild left basilar atelectasis. Electronically Signed   By: Alcide Clever M.D.   On: 08/31/2023 22:22        Scheduled Meds:  amLODipine  5 mg Oral Daily   atorvastatin  80 mg Oral Daily   budesonide (PULMICORT) nebulizer solution  0.25 mg Nebulization BID   busPIRone  30 mg Oral q AM   And   busPIRone  15 mg Oral Q1200   And   busPIRone  15 mg Oral QPM   clonazePAM  0.5 mg Oral BID   doxepin  75 mg Oral QHS   doxycycline  100 mg Oral Q12H   enoxaparin (LOVENOX) injection  40 mg Subcutaneous Q24H   escitalopram  20 mg Oral q AM   ezetimibe  10 mg Oral Daily   furosemide  20 mg Oral q AM   guaiFENesin  600 mg Oral BID   insulin aspart  0-5 Units Subcutaneous QHS   insulin aspart  0-9 Units Subcutaneous TID WC   ipratropium-albuterol  3 mL Nebulization Q6H   levothyroxine  112 mcg Oral q AM   losartan  75 mg Oral Daily   methylPREDNISolone (SOLU-MEDROL) injection  40 mg Intravenous Q12H   montelukast  10 mg Oral QHS   nebivolol  10 mg Oral Daily   nicotine  21 mg Transdermal Daily   pantoprazole  40 mg Oral Daily   sodium chloride flush  3 mL Intravenous Q12H   ticagrelor  60 mg Oral BID   Continuous  Infusions:  cefTRIAXone (ROCEPHIN)  IV 2 g (09/02/23 0047)     LOS: 1 day    Time spent: 35 minutes.    Berton Mount, MD  [facility] Pager #: [phone] 7PM-7AM contact night coverage as above

## 2023-09-02 NOTE — Progress Notes (Signed)
Mobility Specialist Progress Note:   09/02/23 1039  Mobility  Activity Ambulated with assistance in hallway  Level of Assistance  (MinG)  Assistive Device  (IV Pole)  Distance Ambulated (ft) 200 ft  Activity Response Tolerated well  Mobility Referral Yes  Mobility visit 1 Mobility  Mobility Specialist Start Time (ACUTE ONLY) 1005  Mobility Specialist Stop Time (ACUTE ONLY) 1025  Mobility Specialist Time Calculation (min) (ACUTE ONLY) 20 min   During Mobility: 87 HR ,  93%-97% SpO2 2 L  Pt received in bed, agreeable to mobility. SB bed mobility. MinG during ambulation. Pt denied any discomfort during session, asx throughout. VSS on 2 L. Pt left in chair with call bell in reach and all needs met.   Leory Plowman  Mobility Specialist Please contact via [employer] office at [phone]

## 2023-09-02 NOTE — Plan of Care (Signed)
Problem: Education: Goal: Knowledge of General Education information will improve Description: Including pain rating scale, medication(s)/side effects and non-pharmacologic comfort measures Outcome: Progressing   Problem: Health Behavior/Discharge Planning: Goal: Ability to manage health-related needs will improve Outcome: Progressing   Problem: Clinical Measurements: Goal: Ability to maintain clinical measurements within normal limits will improve Outcome: Progressing Goal: Will remain free from infection Outcome: Progressing Goal: Diagnostic test results will improve Outcome: Progressing Goal: Respiratory complications will improve Outcome: Progressing Goal: Cardiovascular complication will be avoided Outcome: Progressing   Problem: Activity: Goal: Risk for activity intolerance will decrease Outcome: Progressing   Problem: Nutrition: Goal: Adequate nutrition will be maintained Outcome: Progressing   Problem: Coping: Goal: Level of anxiety will decrease Outcome: Progressing   Problem: Elimination: Goal: Will not experience complications related to bowel motility Outcome: Progressing Goal: Will not experience complications related to urinary retention Outcome: Progressing   Problem: Pain Managment: Goal: General experience of comfort will improve and/or be controlled Outcome: Progressing   Problem: Safety: Goal: Ability to remain free from injury will improve Outcome: Progressing   Problem: Skin Integrity: Goal: Risk for impaired skin integrity will decrease Outcome: Progressing   Problem: Education: Goal: Knowledge of disease or condition will improve Outcome: Progressing Goal: Knowledge of the prescribed therapeutic regimen will improve Outcome: Progressing Goal: Individualized Educational Video(s) Outcome: Progressing   Problem: Activity: Goal: Ability to tolerate increased activity will improve Outcome: Progressing Goal: Will verbalize the  importance of balancing activity with adequate rest periods Outcome: Progressing   Problem: Respiratory: Goal: Ability to maintain a clear airway will improve Outcome: Progressing Goal: Levels of oxygenation will improve Outcome: Progressing Goal: Ability to maintain adequate ventilation will improve Outcome: Progressing   Problem: Activity: Goal: Ability to tolerate increased activity will improve Outcome: Progressing   Problem: Clinical Measurements: Goal: Ability to maintain a body temperature in the normal range will improve Outcome: Progressing   Problem: Respiratory: Goal: Ability to maintain adequate ventilation will improve Outcome: Progressing Goal: Ability to maintain a clear airway will improve Outcome: Progressing   Problem: Education: Goal: Knowledge of General Education information will improve Description: Including pain rating scale, medication(s)/side effects and non-pharmacologic comfort measures Outcome: Progressing   Problem: Health Behavior/Discharge Planning: Goal: Ability to manage health-related needs will improve Outcome: Progressing   Problem: Clinical Measurements: Goal: Ability to maintain clinical measurements within normal limits will improve Outcome: Progressing Goal: Will remain free from infection Outcome: Progressing Goal: Diagnostic test results will improve Outcome: Progressing Goal: Respiratory complications will improve Outcome: Progressing Goal: Cardiovascular complication will be avoided Outcome: Progressing   Problem: Activity: Goal: Risk for activity intolerance will decrease Outcome: Progressing   Problem: Nutrition: Goal: Adequate nutrition will be maintained Outcome: Progressing   Problem: Coping: Goal: Level of anxiety will decrease Outcome: Progressing   Problem: Elimination: Goal: Will not experience complications related to bowel motility Outcome: Progressing Goal: Will not experience complications related  to urinary retention Outcome: Progressing   Problem: Pain Managment: Goal: General experience of comfort will improve and/or be controlled Outcome: Progressing   Problem: Safety: Goal: Ability to remain free from injury will improve Outcome: Progressing   Problem: Skin Integrity: Goal: Risk for impaired skin integrity will decrease Outcome: Progressing   Problem: Education: Goal: Ability to describe self-care measures that may prevent or decrease complications (Diabetes Survival Skills Education) will improve Outcome: Progressing Goal: Individualized Educational Video(s) Outcome: Progressing   Problem: Coping: Goal: Ability to adjust to condition or change in health will improve Outcome: Progressing  Problem: Fluid Volume: Goal: Ability to maintain a balanced intake and output will improve Outcome: Progressing

## 2023-09-02 NOTE — Progress Notes (Signed)
Patient has excellent vasculature; please assess and/or attempt prior to placing IVT consult.   Cace Osorto Loyola Mast, RN

## 2023-09-02 NOTE — TOC Initial Note (Addendum)
Transition of Care (TOC) - Initial/Assessment Note   Spoke to patient at bedside. Patient has home oxygen through [employer] . She has portable oxygen tanks at home.   When medically ready daughter can provide transportation home. If daughter unable to bring portable oxygen tank at discharge. NCM can order through [employer]. Patient aware.   Patient also has a walker at home. States she does not have a bedside commode but does not need one.   Patient active with [facility] for [facility] and HHPT and would like to continue services . Cory with [facility] checking to see if they can accept patient back . Kandee Keen can accept back will need HHRN PT and face to face orders secure chatted MD  Patient Details  Name: [name] [name] MRN: [id number] Date of Birth: [birth year]  Transition of Care [facility]) CM/SW Contact:    Kingsley Plan, RN Phone Number: 09/02/2023, 1:00 PM  Clinical Narrative:                   Expected Discharge Plan:  (see note)     Patient Goals and CMS Choice            Expected Discharge Plan and Services In-house Referral: NA Discharge Planning Services: CM Consult Post Acute Care Choice: NA Living arrangements for the past 2 months: Apartment                 DME Arranged: N/A         HH Arranged:  (see note)          Prior Living Arrangements/Services Living arrangements for the past 2 months: Apartment Lives with:: Self Patient language and need for interpreter reviewed:: Yes Do you feel safe going back to the place where you live?: Yes      Need for Family Participation in Patient Care: Yes (Comment) Care giver support system in place?: Yes (comment) Current home services: Home PT, Home RN, DME Criminal Activity/Legal Involvement Pertinent to Current Situation/Hospitalization: No - Comment as needed  Activities of Daily Living   ADL Screening (condition at time of admission) Independently performs ADLs?: Yes (appropriate for developmental age) Is the patient  deaf or have difficulty hearing?: No Does the patient have difficulty seeing, even when wearing glasses/contacts?: No Does the patient have difficulty concentrating, remembering, or making decisions?: No  Permission Sought/Granted   Permission granted to share information with : Yes, Verbal Permission Granted     Permission granted to share info w AGENCY: [employer] [facility] [employer]        Emotional Assessment Appearance:: Appears stated age Attitude/Demeanor/Rapport: Engaged Affect (typically observed): Appropriate Orientation: : Oriented to Self, Oriented to Place, Oriented to  Time, Oriented to Situation Alcohol / Substance Use: Not Applicable Psych Involvement: No (comment)  Admission diagnosis:  COPD exacerbation (HCC) [J44.1] COPD with acute exacerbation (HCC) [J44.1] Patient Active Problem List   Diagnosis Date Noted   COPD with acute exacerbation (HCC) 09/01/2023   Chronic hypoxic respiratory failure (HCC) 09/01/2023   Continuous dependence on cigarette smoking 09/01/2023   History of CAD (coronary artery disease) 09/01/2023   CAP (community acquired pneumonia) 09/01/2023   Leukocytosis 07/21/2023   Infection due to parainfluenza virus 4 06/20/2023   Full code status 06/14/2023   Acute hypoxic respiratory failure (HCC) 06/13/2023   Acute exacerbation of chronic obstructive pulmonary disease (COPD) (HCC) 06/12/2023   Nicotine dependence 05/03/2023   Left ankle sprain 04/17/2023   Substance abuse (HCC) 09/10/2022   Nontraumatic complete tear  of right rotator cuff    Superior glenoid labrum lesion of left shoulder    Iron deficiency anemia 11/02/2021   Major depressive disorder, recurrent, in full remission with anxious distress (HCC) 05/31/2021   Rotator cuff tendinitis, right 05/08/2021   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   Chronic neck pain 02/26/2021   GAD (generalized anxiety disorder) 01/08/2021   Acute cystitis without hematuria 11/07/2020   Influenza  vaccine needed 06/15/2020   Abdominal pain, epigastric 04/28/2019   Esophageal dysphagia 04/28/2019   Bipolar I disorder (HCC) 09/17/2018   Insomnia 10/28/2017   Prediabetes 05/28/2017   QT prolongation 03/25/2017   Encounter for screening mammogram for breast cancer 09/11/2015   Psoriasis 06/22/2015   COPD (chronic obstructive pulmonary disease) (HCC)GOLD E 05/18/2015   Anxiety and depression 07/10/2013   Essential hypertension 06/07/2013   Hyperlipidemia 06/07/2013   Asthma, chronic 06/07/2013   Emphysema lung (HCC) 06/07/2013   Chronic back pain 06/07/2013   CAD S/P percutaneous coronary angioplasty 06/07/2013   GERD (gastroesophageal reflux disease) 06/07/2013   Breast lump on left side at 3 o'clock position 10/13/2012   S/P abdominal hysterectomy and right salpingo-oophorectomy 08/29/2011   COPD exacerbation (HCC) 09/15/2009   TOBACCO ABUSE 07/17/2009   Chronic rhinitis 07/17/2009   Lung nodule < 6cm on CT 07/17/2009   ALLERGY, FOOD 07/17/2009   Hypothyroidism 12/22/2008   Chronic depression 12/22/2008   PCP:  Martina Sinner, MD Pharmacy:   [facility] [address] [place] [address] Phone: [phone] Fax: [phone]  [facility] - [place], [place] - [address] [place] [address] Phone: [phone] Fax: [phone]     Social Drivers of Health (SDOH) Social History: SDOH Screenings   Food Insecurity: No Food Insecurity (09/01/2023)  Recent Concern: Food Insecurity - Food Insecurity Present (08/07/2023)  Housing: Low Risk  (09/01/2023)  Transportation Needs: No Transportation Needs (09/01/2023)  Utilities: Not At Risk (09/01/2023)  Alcohol Screen: Low Risk  (08/07/2023)  Depression (PHQ2-9): Medium Risk (08/07/2023)  Financial Resource Strain: Medium Risk (08/07/2023)  Physical Activity: Inactive (08/07/2023)  Social Connections: Moderately  Isolated (09/01/2023)  Stress: No Stress Concern Present (08/07/2023)  Tobacco Use: High Risk (09/01/2023)  Health Literacy: Inadequate Health Literacy (08/07/2023)   SDOH Interventions:     Readmission Risk Interventions    06/20/2023    3:17 PM  Readmission Risk Prevention Plan  Transportation Screening Complete  HRI or Home Care Consult Complete  Social Work Consult for Recovery Care Planning/Counseling Complete  Palliative Care Screening Not Applicable  Medication Review Oceanographer) Complete

## 2023-09-02 NOTE — Progress Notes (Signed)
PHARMACIST - PHYSICIAN COMMUNICATION DR:   Dartha Lodge CONCERNING: Antibiotic IV to Oral Route Change Policy  RECOMMENDATION: This patient is receiving doxycycline by the intravenous route.  Based on criteria approved by the Pharmacy and Therapeutics Committee, the antibiotic(s) is/are being converted to the equivalent oral dose form(s).   DESCRIPTION: These criteria include: Patient being treated for a respiratory tract infection, urinary tract infection, cellulitis or clostridium difficile associated diarrhea if on metronidazole The patient is not neutropenic and does not exhibit a GI malabsorption state The patient is eating (either orally or via tube) and/or has been taking other orally administered medications for a least 24 hours The patient is improving clinically and has a Tmax < 100.5  If you have questions about this conversion, please contact the Pharmacy Department  []   [phone] )  [name] [name] [x]   [phone] )  [name] [name]  []   [phone] )  [name] []   [phone] )  [facility]   [name] [name], PharmD, BCPS 09/02/2023 11:34 AM

## 2023-09-03 DIAGNOSIS — J441 Chronic obstructive pulmonary disease with (acute) exacerbation: Secondary | ICD-10-CM | POA: Diagnosis not present

## 2023-09-03 LAB — GLUCOSE, CAPILLARY
Glucose-Capillary: 214 mg/dL — ABNORMAL HIGH (ref 70–99)
Glucose-Capillary: 153 mg/dL — ABNORMAL HIGH (ref 70–99)
Glucose-Capillary: 172 mg/dL — ABNORMAL HIGH (ref 70–99)
Glucose-Capillary: 163 mg/dL — ABNORMAL HIGH (ref 70–99)

## 2023-09-03 NOTE — Plan of Care (Signed)
  Problem: Clinical Measurements: Goal: Ability to maintain clinical measurements within normal limits will improve Outcome: Progressing   Problem: Activity: Goal: Risk for activity intolerance will decrease Outcome: Progressing   Problem: Nutrition: Goal: Adequate nutrition will be maintained Outcome: Progressing   Problem: Elimination: Goal: Will not experience complications related to bowel motility Outcome: Progressing   

## 2023-09-03 NOTE — Hospital Course (Signed)
68 year old female, with past medical history significant for COPD, continuous tobacco abuse since the age of 53 (patient continues to smoke 1 pack of cigarettes daily), chronic respiratory failure on 2 L of supplemental oxygen via nasal cannula at home, hypothyroidism, hypertension, dyslipidemia, anxiety and depression. Patient was admitted with COPD exacerbation. Apparently, symptoms started about a week ago with wheezing, worsening shortness of breath and cough that is productive of whitish sputum.

## 2023-09-03 NOTE — Progress Notes (Signed)
  Progress Note   Patient: [name] [name] XBJ:[id number] DOB: [birth year] DOA: 08/31/2023     2 DOS: the patient was seen and examined on 09/03/2023   Brief hospital course: 68 year old female, with past medical history significant for COPD, continuous tobacco abuse since the age of 60 (patient continues to smoke 1 pack of cigarettes daily), chronic respiratory failure on 2 L of supplemental oxygen via nasal cannula at home, hypothyroidism, hypertension, dyslipidemia, anxiety and depression. Patient was admitted with COPD exacerbation. Apparently, symptoms started about a week ago with wheezing, worsening shortness of breath and cough that is productive of whitish sputum.   Assessment and Plan: COPD exacerbation History of chronic COPD Comment echo pneumonia History of chronic hypoxic respiratory failure 2 to 3 L oxygen at baseline -Nebs DuoNeb, nebs albuterol as needed, IV Solu-Medrol and nebs Pulmicort. -Continue supplemental oxygen. -Patient was counseled to quit tobacco use.  -Still wheezing this AM   History of CAD -Stable.  No chest pain. -Continue Lipitor 80 mg daily, nebivolol 10 mg daily and Brilinta 60 mg twice daily.   Essential hypertension -Blood pressure is controlled. - Continue home regimens include amlodipine 5 mg, Lasix 20 mg, losartan 75 mg and nebivolol 10 mg.   History of prolonged QT prolongation - Continue to avoid any QT prolonging medication.   Hyperlipidemia - Continue Lipitor and ezetimibe.    Hypothyroidism - Continue levo hyroxine   Chronic smoker -Patient has smoked cigarettes since age of 78. -Patient currently smokes 1 pack of cigarettes daily. -Counseled to quit tobacco use.    Generalized anxiety disorder -Stable.   Insomnia   Prediabetic/glucose intolerance History of prediabetes -Sliding scale insulin coverage.       Subjective: Still wheezing this AM  Physical Exam: Vitals:   09/03/23 0212 09/03/23 0509 09/03/23 0738 09/03/23  1556  BP:  (!) 117/55 135/62 133/71  Pulse:  63 65 72  Resp:  17 16 16   Temp:  98.1 F (36.7 C) 98 F (36.7 C) 98.7 F (37.1 C)  TempSrc:  Oral Oral Oral  SpO2: 95% 99% 97% 95%  Weight:      Height:       General exam: Awake, laying in bed, in nad Respiratory system: Normal respiratory effort, no wheezing Cardiovascular system: regular rate, s1, s2 Gastrointestinal system: Soft, nondistended, positive BS Central nervous system: CN2-12 grossly intact, strength intact Extremities: Perfused, no clubbing Skin: Normal skin turgor, no notable skin lesions seen Psychiatry: Mood normal // no visual hallucinations   Data Reviewed:  There are no new results to review at this time.  Family Communication: Pt in room, family not at bedside  Disposition: Status is: Inpatient Remains inpatient appropriate because: severity of illness  Planned Discharge Destination: Home    Author: Rickey Barbara, MD 09/03/2023 5:10 PM  For on call review [email or web address].

## 2023-09-03 NOTE — Progress Notes (Signed)
Mobility Specialist Progress Note:   09/03/23 1038  Mobility  Activity Ambulated with assistance in hallway  Level of Assistance Standby assist, set-up cues, supervision of patient - no hands on  Assistive Device Front wheel walker  Distance Ambulated (ft) 200 ft  Activity Response Tolerated well  Mobility Referral Yes  Mobility visit 1 Mobility  Mobility Specialist Start Time (ACUTE ONLY) 0931  Mobility Specialist Stop Time (ACUTE ONLY) 0940  Mobility Specialist Time Calculation (min) (ACUTE ONLY) 9 min   Pt received in bed, agreeable to mobility. VSS on 2 L. C/o slight fatigue, otherwise asx throughout. Pt returned to bed with call bell in reach and all needs met.   Leory Plowman  Mobility Specialist Please contact via [employer] office at [phone]

## 2023-09-03 NOTE — Plan of Care (Signed)
Problem: Education: Goal: Knowledge of General Education information will improve Description: Including pain rating scale, medication(s)/side effects and non-pharmacologic comfort measures Outcome: Progressing   Problem: Health Behavior/Discharge Planning: Goal: Ability to manage health-related needs will improve Outcome: Progressing   Problem: Clinical Measurements: Goal: Ability to maintain clinical measurements within normal limits will improve Outcome: Progressing Goal: Will remain free from infection Outcome: Progressing Goal: Diagnostic test results will improve Outcome: Progressing Goal: Respiratory complications will improve Outcome: Progressing Goal: Cardiovascular complication will be avoided Outcome: Progressing   Problem: Activity: Goal: Risk for activity intolerance will decrease Outcome: Progressing   Problem: Nutrition: Goal: Adequate nutrition will be maintained Outcome: Progressing   Problem: Coping: Goal: Level of anxiety will decrease Outcome: Progressing   Problem: Elimination: Goal: Will not experience complications related to bowel motility Outcome: Progressing Goal: Will not experience complications related to urinary retention Outcome: Progressing   Problem: Pain Managment: Goal: General experience of comfort will improve and/or be controlled Outcome: Progressing   Problem: Safety: Goal: Ability to remain free from injury will improve Outcome: Progressing   Problem: Skin Integrity: Goal: Risk for impaired skin integrity will decrease Outcome: Progressing   Problem: Education: Goal: Knowledge of disease or condition will improve Outcome: Progressing Goal: Knowledge of the prescribed therapeutic regimen will improve Outcome: Progressing Goal: Individualized Educational Video(s) Outcome: Progressing   Problem: Activity: Goal: Ability to tolerate increased activity will improve Outcome: Progressing Goal: Will verbalize the  importance of balancing activity with adequate rest periods Outcome: Progressing   Problem: Respiratory: Goal: Ability to maintain a clear airway will improve Outcome: Progressing Goal: Levels of oxygenation will improve Outcome: Progressing Goal: Ability to maintain adequate ventilation will improve Outcome: Progressing   Problem: Activity: Goal: Ability to tolerate increased activity will improve Outcome: Progressing   Problem: Clinical Measurements: Goal: Ability to maintain a body temperature in the normal range will improve Outcome: Progressing   Problem: Respiratory: Goal: Ability to maintain adequate ventilation will improve Outcome: Progressing Goal: Ability to maintain a clear airway will improve Outcome: Progressing   Problem: Education: Goal: Knowledge of General Education information will improve Description: Including pain rating scale, medication(s)/side effects and non-pharmacologic comfort measures Outcome: Progressing   Problem: Health Behavior/Discharge Planning: Goal: Ability to manage health-related needs will improve Outcome: Progressing   Problem: Clinical Measurements: Goal: Ability to maintain clinical measurements within normal limits will improve Outcome: Progressing Goal: Will remain free from infection Outcome: Progressing Goal: Diagnostic test results will improve Outcome: Progressing Goal: Respiratory complications will improve Outcome: Progressing Goal: Cardiovascular complication will be avoided Outcome: Progressing   Problem: Activity: Goal: Risk for activity intolerance will decrease Outcome: Progressing   Problem: Nutrition: Goal: Adequate nutrition will be maintained Outcome: Progressing   Problem: Coping: Goal: Level of anxiety will decrease Outcome: Progressing   Problem: Elimination: Goal: Will not experience complications related to bowel motility Outcome: Progressing Goal: Will not experience complications related  to urinary retention Outcome: Progressing   Problem: Pain Managment: Goal: General experience of comfort will improve and/or be controlled Outcome: Progressing   Problem: Safety: Goal: Ability to remain free from injury will improve Outcome: Progressing   Problem: Skin Integrity: Goal: Risk for impaired skin integrity will decrease Outcome: Progressing   Problem: Education: Goal: Ability to describe self-care measures that may prevent or decrease complications (Diabetes Survival Skills Education) will improve Outcome: Progressing Goal: Individualized Educational Video(s) Outcome: Progressing   Problem: Coping: Goal: Ability to adjust to condition or change in health will improve Outcome: Progressing  Problem: Fluid Volume: Goal: Ability to maintain a balanced intake and output will improve Outcome: Progressing

## 2023-09-04 ENCOUNTER — Encounter: Payer: Self-pay | Admitting: Internal Medicine

## 2023-09-04 DIAGNOSIS — J441 Chronic obstructive pulmonary disease with (acute) exacerbation: Secondary | ICD-10-CM | POA: Diagnosis not present

## 2023-09-04 LAB — GLUCOSE, CAPILLARY
Glucose-Capillary: 242 mg/dL — ABNORMAL HIGH (ref 70–99)
Glucose-Capillary: 196 mg/dL — ABNORMAL HIGH (ref 70–99)
Glucose-Capillary: 122 mg/dL — ABNORMAL HIGH (ref 70–99)
Glucose-Capillary: 145 mg/dL — ABNORMAL HIGH (ref 70–99)

## 2023-09-04 MED ORDER — ORAL CARE MOUTH RINSE: 15.0000 mL | OROMUCOSAL | Status: DC | PRN

## 2023-09-04 NOTE — Progress Notes (Signed)
Mobility Specialist Progress Note:   09/04/23 1009  Mobility  Activity Ambulated with assistance in hallway  Level of Assistance Standby assist, set-up cues, supervision of patient - no hands on  Assistive Device None  Distance Ambulated (ft) 200 ft  Activity Response Tolerated well  Mobility Referral Yes  Mobility visit 1 Mobility  Mobility Specialist Start Time (ACUTE ONLY) 0900  Mobility Specialist Stop Time (ACUTE ONLY) 0911  Mobility Specialist Time Calculation (min) (ACUTE ONLY) 11 min   Pre Mobility: 72 HR ,  95% SpO2 RA During Mobility: 78 HR ,  86-90% SpO2 RA - 1 L Post Mobility: 70 HR , 90% SpO2 1 L  Pt received in bed, agreeable to mobility. Desat to 86% on RA. Pursed lip breathing encouraged. Pt needing 1 L to keep sats above 89%. Pt c/o slight SOB, oherwise asx throughout. Rated SOB 5/10. Pt returned to bed with call bell in reach and all needs met. RN notified.    [name] [name]  Mobility Specialist Please contact via [employer] office at [phone]

## 2023-09-04 NOTE — Evaluation (Signed)
Physical Therapy Evaluation Patient Details Name: [name] [name] MRN: [id number] DOB: 10-Apr-1956 Today's Date: 09/04/2023  History of Present Illness  Pt is 68 yo female who presents on 08/31/23 with SOB. Acute COPD exacerbation. Aspiration PNA.  PMH:  HTN, CAD with multiple DES, DM2, COPD, anxiety, hypothyroidism, RTC repair 12/24/2021, ongoing tobacco abuse.  Clinical Impression  Pt admitted with above diagnosis. Pt mobilizing well but fatigues very quickly. She also had episode of instability with her O2 off earlier today. Pt needing supervision for safety and she will not have this at home so would like her to stay one more night for another session of PT and d/c tomorrow. Pt currently with functional limitations due to the deficits listed below (see PT Problem List). Pt will benefit from acute skilled PT to increase their independence and safety with mobility to allow discharge.           If plan is discharge home, recommend the following: Assist for transportation;Assistance with cooking/housework   Can travel by private vehicle        Equipment Recommendations None recommended by PT  Recommendations for Other Services       Functional Status Assessment Patient has had a recent decline in their functional status and demonstrates the ability to make significant improvements in function in a reasonable and predictable amount of time.     Precautions / Restrictions Precautions Precautions: None Restrictions Weight Bearing Restrictions Per Provider Order: No      Mobility  Bed Mobility Overal bed mobility: Modified Independent                  Transfers Overall transfer level: Needs assistance Equipment used: Rolling walker (2 wheels), None Transfers: Sit to/from Stand Sit to Stand: Supervision           General transfer comment: supervision for safety    Ambulation/Gait Ambulation/Gait assistance: Contact guard assist Gait Distance (Feet): 300 Feet (with 1  seated rest break) Assistive device: Rollator (4 wheels) Gait Pattern/deviations: Step-through pattern, Decreased stride length Gait velocity: decreased Gait velocity interpretation: 1.31 - 2.62 ft/sec, indicative of limited community ambulator   General Gait Details: pt with decreaseding pace as she fatigues. Needs seated rest each 150'. SPO2 96% on 3L O2, HR in 60's  Stairs            Wheelchair Mobility     Tilt Bed    Modified Rankin (Stroke Patients Only)       Balance Overall balance assessment: Needs assistance Sitting-balance support: No upper extremity supported, Feet supported Sitting balance-Leahy Scale: Good     Standing balance support: No upper extremity supported, During functional activity Standing balance-Leahy Scale: Fair Standing balance comment: pt often uses no AD and is stable with activities within BOS but RN reported near fall this AM and pt gets anxious with SOB so continues to have increased fall risk                             Pertinent Vitals/Pain Pain Assessment Pain Assessment: No/denies pain    Home Living Family/patient expects to be discharged to:: Private residence Living Arrangements: Alone Available Help at Discharge: Family;Available PRN/intermittently Type of Home: Apartment Home Access: Stairs to enter Entrance Stairs-Rails: Right;Left Entrance Stairs-Number of Steps: 2   Home Layout: One level Home Equipment: Agricultural consultant (2 wheels);Shower seat;Hand held shower head;Grab bars - tub/shower;Rollator (4 wheels) (pulse ox) Additional Comments: daughter lives nearby  Prior Function Prior Level of Function : Independent/Modified Independent             Mobility Comments: uses RW intermittenly ADLs Comments: does not drive. Mod I with ADLs     Extremity/Trunk Assessment   Upper Extremity Assessment Upper Extremity Assessment: Generalized weakness    Lower Extremity Assessment Lower Extremity  Assessment: Generalized weakness    Cervical / Trunk Assessment Cervical / Trunk Assessment: Normal  Communication   Communication Communication: No apparent difficulties    Cognition Arousal: Alert Behavior During Therapy: WFL for tasks assessed/performed   PT - Cognitive impairments: No apparent impairments                         Following commands: Intact       Cueing Cueing Techniques: Verbal cues     General Comments General comments (skin integrity, edema, etc.): Pt left in chair on 2L O2, SPO2 96%.    Exercises     Assessment/Plan    PT Assessment Patient needs continued PT services  PT Problem List Decreased strength;Decreased activity tolerance;Decreased mobility;Cardiopulmonary status limiting activity       PT Treatment Interventions Gait training;Functional mobility training;Stair training;Balance training;Patient/family education    PT Goals (Current goals can be found in the Care Plan section)  Acute Rehab PT Goals Patient Stated Goal: return home PT Goal Formulation: With patient Time For Goal Achievement: 09/18/23 Potential to Achieve Goals: Good    Frequency Min 1X/week     Co-evaluation               AM-PAC PT "6 Clicks" Mobility  Outcome Measure Help needed turning from your back to your side while in a flat bed without using bedrails?: None Help needed moving from lying on your back to sitting on the side of a flat bed without using bedrails?: None Help needed moving to and from a bed to a chair (including a wheelchair)?: None Help needed standing up from a chair using your arms (e.g., wheelchair or bedside chair)?: A Little Help needed to walk in hospital room?: A Little Help needed climbing 3-5 steps with a railing? : A Little 6 Click Score: 21    End of Session Equipment Utilized During Treatment: Oxygen Activity Tolerance: Patient tolerated treatment well Patient left: in chair;with call bell/phone within  reach;with chair alarm set Nurse Communication: Mobility status PT Visit Diagnosis: Unsteadiness on feet (R26.81)    Time: 2956-2130 PT Time Calculation (min) (ACUTE ONLY): 22 min   Charges:   PT Evaluation $PT Eval Moderate Complexity: 1 Mod   PT General Charges $$ ACUTE PT VISIT: 1 Visit         Lyanne Co, PT  Acute Rehab Services Secure chat preferred Office [phone]   Lawana Chambers Keoni Risinger 09/04/2023, 2:04 PM

## 2023-09-04 NOTE — Progress Notes (Signed)
Oxygen tanks delivered to the room.

## 2023-09-04 NOTE — Progress Notes (Signed)
  Progress Note   Patient: [name] [name] BJY:[id number] DOB: 12/07/1955 DOA: 08/31/2023     3 DOS: the patient was seen and examined on 09/04/2023   Brief hospital course: 68 year old female, with past medical history significant for COPD, continuous tobacco abuse since the age of 33 (patient continues to smoke 1 pack of cigarettes daily), chronic respiratory failure on 2 L of supplemental oxygen via nasal cannula at home, hypothyroidism, hypertension, dyslipidemia, anxiety and depression. Patient was admitted with COPD exacerbation. Apparently, symptoms started about a week ago with wheezing, worsening shortness of breath and cough that is productive of whitish sputum.   Assessment and Plan: COPD exacerbation History of chronic COPD Comment echo pneumonia History of chronic hypoxic respiratory failure 2 to 3 L oxygen at baseline -Nebs DuoNeb, nebs albuterol as needed, IV Solu-Medrol and nebs Pulmicort. -Continue supplemental oxygen. -Patient was counseled to quit tobacco use.  -Remains wheezing this AM. Cont nebs and steroids -Seen by PT for ambulation trial. Per pt, recommendation for another day observation and continued PT tomorrow   History of CAD -Stable.  No chest pain. -Continue Lipitor 80 mg daily, nebivolol 10 mg daily and Brilinta 60 mg twice daily.   Essential hypertension -Blood pressure is controlled. - Continue home regimens include amlodipine 5 mg, Lasix 20 mg, losartan 75 mg and nebivolol 10 mg.   History of prolonged QT prolongation - Continue to avoid any QT prolonging medication.   Hyperlipidemia - Continue Lipitor and ezetimibe.    Hypothyroidism - Continue levo hyroxine   Chronic smoker -Patient has smoked cigarettes since age of 9. -Patient currently smokes 1 pack of cigarettes daily. -Counseled to quit tobacco use.    Generalized anxiety disorder -Stable.   Insomnia   Prediabetic/glucose intolerance History of prediabetes -Sliding scale insulin  coverage.       Subjective: Remains wheezing this AM  Physical Exam: Vitals:   09/04/23 0832 09/04/23 1502 09/04/23 1755 09/04/23 1803  BP: 118/66   108/68  Pulse: 68   65  Resp: 17   17  Temp: 98.4 F (36.9 C)   99 F (37.2 C)  TempSrc: Oral   Oral  SpO2: 98% 99% 99% 94%  Weight:      Height:       General exam: Conversant, in no acute distress Respiratory system: normal chest rise, clear, no audible wheezing Cardiovascular system: regular rhythm, s1-s2 Gastrointestinal system: Nondistended, nontender, pos BS Central nervous system: No seizures, no tremors Extremities: No cyanosis, no joint deformities Skin: No rashes, no pallor Psychiatry: Affect normal // no auditory hallucinations   Data Reviewed:  There are no new results to review at this time.  Family Communication: Pt in room, family not at bedside  Disposition: Status is: Inpatient Remains inpatient appropriate because: severity of illness  Planned Discharge Destination: Home    Author: Rickey Barbara, MD 09/04/2023 6:22 PM  For on call review [email or web address].

## 2023-09-04 NOTE — TOC Progression Note (Signed)
Transition of Care (TOC) - Progression Note   Received secure chat from charge nurse that discharge is possible today and daughter cannot bring portable oxygen tank to room .   NCM called Earna Coder with [employer], he will have a portable tank delivered to room . Charge nurse aware. Patient Details  Name: [name] [name] MRN: [id number] Date of Birth: [birth year]  Transition of Care [facility]) CM/SW Contact  Shadell Brenn, Adria Devon, RN Phone Number: 09/04/2023, 11:59 AM  Clinical Narrative:       Expected Discharge Plan:  (see note)    Expected Discharge Plan and Services In-house Referral: NA Discharge Planning Services: CM Consult Post Acute Care Choice: NA Living arrangements for the past 2 months: Apartment                 DME Arranged: N/A         HH Arranged:  (see note)           Social Determinants of Health (SDOH) Interventions SDOH Screenings   Food Insecurity: No Food Insecurity (09/01/2023)  Recent Concern: Food Insecurity - Food Insecurity Present (08/07/2023)  Housing: Low Risk  (09/01/2023)  Transportation Needs: No Transportation Needs (09/01/2023)  Utilities: Not At Risk (09/01/2023)  Alcohol Screen: Low Risk  (08/07/2023)  Depression (PHQ2-9): Medium Risk (08/07/2023)  Financial Resource Strain: Medium Risk (08/07/2023)  Physical Activity: Inactive (08/07/2023)  Social Connections: Moderately Isolated (09/01/2023)  Stress: No Stress Concern Present (08/07/2023)  Tobacco Use: High Risk (09/01/2023)  Health Literacy: Inadequate Health Literacy (08/07/2023)    Readmission Risk Interventions    06/20/2023    3:17 PM  Readmission Risk Prevention Plan  Transportation Screening Complete  HRI or Home Care Consult Complete  Social Work Consult for Recovery Care Planning/Counseling Complete  Palliative Care Screening Not Applicable  Medication Review Oceanographer) Complete

## 2023-09-04 NOTE — Progress Notes (Signed)
Nurse requested Mobility Specialist to perform oxygen saturation test with pt which includes removing pt from oxygen both at rest and while ambulating.  Below are the results from that testing.     Patient Saturations on Room Air at Rest = spO2 95%  Patient Saturations on Room Air while Ambulating = sp02 86% .  Rested and performed pursed lip breathing for 1 minute with sp02 at 87%.  Patient Saturations on 1 Liters of oxygen while Ambulating = sp02 90%  At end of testing pt left in room on 1  Liters of oxygen.  Reported results to nurse.

## 2023-09-05 ENCOUNTER — Encounter: Payer: Self-pay | Admitting: Hematology

## 2023-09-05 ENCOUNTER — Other Ambulatory Visit (HOSPITAL_COMMUNITY): Payer: Self-pay

## 2023-09-05 ENCOUNTER — Other Ambulatory Visit: Payer: Self-pay | Admitting: Internal Medicine

## 2023-09-05 ENCOUNTER — Other Ambulatory Visit: Payer: Self-pay

## 2023-09-05 DIAGNOSIS — J441 Chronic obstructive pulmonary disease with (acute) exacerbation: Secondary | ICD-10-CM | POA: Diagnosis not present

## 2023-09-05 LAB — GLUCOSE, CAPILLARY
Glucose-Capillary: 235 mg/dL — ABNORMAL HIGH (ref 70–99)
Glucose-Capillary: 114 mg/dL — ABNORMAL HIGH (ref 70–99)
Glucose-Capillary: 148 mg/dL — ABNORMAL HIGH (ref 70–99)

## 2023-09-05 MED ORDER — TRUE METRIX BLOOD GLUCOSE TEST VI STRP
ORAL_STRIP | 12 refills | Status: DC
  Filled 2023-09-05: qty 100, fill #0
  Filled 2023-09-05: qty 100, 100d supply, fill #0

## 2023-09-05 MED ORDER — DOXYCYCLINE HYCLATE 100 MG PO TABS
100.0000 mg | ORAL_TABLET | Freq: Two times a day (BID) | ORAL | 0 refills | Status: AC
  Filled 2023-09-05: qty 6, 3d supply, fill #0

## 2023-09-05 MED ORDER — NICOTINE 21 MG/24HR TD PT24
21.0000 mg | MEDICATED_PATCH | Freq: Every day | TRANSDERMAL | 0 refills | Status: DC
  Filled 2023-09-05: qty 28, 28d supply, fill #0

## 2023-09-05 MED ORDER — ACCU-CHEK GUIDE W/DEVICE KIT
PACK | 0 refills | Status: DC
  Filled 2023-09-05: qty 1, fill #0
  Filled 2023-09-05: qty 1, 30d supply, fill #0

## 2023-09-05 MED ORDER — PREDNISONE 10 MG PO TABS
ORAL_TABLET | ORAL | 0 refills | Status: DC
  Filled 2023-09-05: qty 22, 11d supply, fill #0

## 2023-09-05 MED ORDER — TRUEPLUS LANCETS 28G MISC
2 refills | Status: DC
  Filled 2023-09-05: qty 100, 100d supply, fill #0
  Filled 2023-09-05: qty 100, fill #0

## 2023-09-05 NOTE — Progress Notes (Signed)
Mobility Specialist Progress Note:   09/05/23 1554  Mobility  Activity Ambulated with assistance in hallway  Level of Assistance Standby assist, set-up cues, supervision of patient - no hands on  Assistive Device None  Distance Ambulated (ft) 300 ft  Activity Response Tolerated well  Mobility Referral Yes  Mobility visit 1 Mobility  Mobility Specialist Start Time (ACUTE ONLY) 1540  Mobility Specialist Stop Time (ACUTE ONLY) 1555  Mobility Specialist Time Calculation (min) (ACUTE ONLY) 15 min   Received pt in bed having no complaints and agreeable to mobility. Pt was asymptomatic throughout session. Ambulated on 2L/min, VSS, no SOB. Returned to room w/o fault. Left in bed w/ call bell in reach and all needs met.   Thompson Grayer Mobility Specialist  Please contact vis Secure Chat or  Rehab Office [phone]

## 2023-09-05 NOTE — Progress Notes (Signed)
Physical Therapy Treatment Patient Details Name: [name] [name] MRN: [id number] DOB: January 16, 1956 Today's Date: 09/05/2023   History of Present Illness Pt is 68 yo female who presents on 08/31/23 with SOB. Acute COPD exacerbation. Aspiration PNA.  PMH:  HTN, CAD with multiple DES, DM2, COPD, anxiety, hypothyroidism, RTC repair 12/24/2021, ongoing tobacco abuse.    PT Comments  PT received in supine, pleasantly agreeable to therapy session after working with mobility specialist on hallway ambulation with no significant c/o fatigue and eager to participate in stair and exercise instruction. Pt SpO2 and HR WFL on 2L O2 [place] with reciprocal sit<>stand exercises and step-ups via platform in her room with only mild DOE 1/4 and improved with seated break. Pt expressed confidence with discharge plan today after increased activity and receptive to handouts on IS use, Energy Conservation and Reducing her Falls Risk. Pt continues to benefit from PT services to progress toward functional mobility goals, do not anticipate any post-acute PT needs at this time, pt has DME already at home.     If plan is discharge home, recommend the following: Assist for transportation;Assistance with cooking/housework   Can travel by private vehicle        Equipment Recommendations  None recommended by PT (pt has 4WW and RW)    Recommendations for Other Services       Precautions / Restrictions Precautions Precautions: None Recall of Precautions/Restrictions: Intact Restrictions Weight Bearing Restrictions Per Provider Order: No     Mobility  Bed Mobility Overal bed mobility: Modified Independent                  Transfers Overall transfer level: Needs assistance Equipment used: None Transfers: Sit to/from Stand Sit to Stand: Modified independent (Device/Increase time), Supervision           General transfer comment: no AD, arms crossed at chest with supervision x5 reps, modI when using her arms     Ambulation/Gait Ambulation/Gait assistance: Supervision Gait Distance (Feet): 15 Feet Assistive device: None Gait Pattern/deviations: Step-through pattern, Decreased stride length Gait velocity: decreased     General Gait Details: SpO2 WFL on 2L O2 [place] with short distance at bedside/stair training and reciprocal STS exercise   Stairs Stairs: Yes Stairs assistance: Supervision Stair Management: One rail Left, Step to pattern, Backwards, Forwards Number of Stairs: 5 General stair comments: min cues for sequencing/safety and activity pacing, SpO2/HR WFL on 2L O2 [place]   Wheelchair Mobility     Tilt Bed    Modified Rankin (Stroke Patients Only)       Balance Overall balance assessment: Needs assistance Sitting-balance support: No upper extremity supported, Feet supported Sitting balance-Leahy Scale: Good     Standing balance support: No upper extremity supported, During functional activity Standing balance-Leahy Scale: Good Standing balance comment: PTA encouraging her to use rollator at home initially and pt agreeable to consult PCP for HHPT or OPPT rec if she does not feel like she is progressing well at home. No LOB with unsupported standing tasks in room for ~5 minutes before seated break.                            Communication Communication Communication: No apparent difficulties  Cognition Arousal: Alert Behavior During Therapy: WFL for tasks assessed/performed   PT - Cognitive impairments: No apparent impairments  PT - Cognition Comments: Appears to be oriented and have insight into medical needs and safety Following commands: Intact      Cueing Cueing Techniques: Verbal cues  Exercises Other Exercises Other Exercises: reciprocal STS x 5 reps with arms crossed at chest from bed height Other Exercises: 7" platform step-ups x5 reps for BLE strengthening Other Exercises: seated review of IS use, handout to reinforce  and for pt to track her progress Other Exercises: Pt also given handouts on reducing Falls Risk at Home and Energy Conservation    General Comments General comments (skin integrity, edema, etc.): SpO2 94% on 2L O2 [place] with exertion; HR 70's bpm with standing      Pertinent Vitals/Pain Pain Assessment Pain Assessment: No/denies pain    Home Living                          Prior Function            PT Goals (current goals can now be found in the care plan section) Acute Rehab PT Goals Patient Stated Goal: return home PT Goal Formulation: With patient Time For Goal Achievement: 09/18/23 Progress towards PT goals: Progressing toward goals    Frequency    Min 1X/week      PT Plan      Co-evaluation              AM-PAC PT "6 Clicks" Mobility   Outcome Measure  Help needed turning from your back to your side while in a flat bed without using bedrails?: None Help needed moving from lying on your back to sitting on the side of a flat bed without using bedrails?: None Help needed moving to and from a bed to a chair (including a wheelchair)?: None Help needed standing up from a chair using your arms (e.g., wheelchair or bedside chair)?: None Help needed to walk in hospital room?: A Little Help needed climbing 3-5 steps with a railing? : A Little 6 Click Score: 22    End of Session Equipment Utilized During Treatment: Oxygen;Gait belt Activity Tolerance: Patient tolerated treatment well Patient left: with call bell/phone within reach;in bed;with family/visitor present (pt upright sitting cross-legged in bed preparing to eat meal that arrived, NT in room to check VS) Nurse Communication: Mobility status (pt good to DC from PTA perspective) PT Visit Diagnosis: Unsteadiness on feet (R26.81)     Time: 0454-0981 PT Time Calculation (min) (ACUTE ONLY): 15 min  Charges:    $Therapeutic Exercise: 8-22 mins PT General Charges $$ ACUTE PT VISIT: 1 Visit                      Tayana Shankle P., PTA Acute Rehabilitation Services Secure Chat Preferred 9a-5:30pm Office: [phone]    Dorathy Kinsman [facility] 09/05/2023, 5:27 PM

## 2023-09-05 NOTE — Discharge Summary (Signed)
Physician Discharge Summary   Patient: [name] [name] MRN: [id number] DOB: 1955-08-23  Admit date:     08/31/2023  Discharge date: 09/05/23  Discharge Physician: Rickey Barbara   PCP: Martina Sinner, MD   Recommendations at discharge:    Follow up with PCP in 1-2 weeks  Discharge Diagnoses: Principal Problem:   COPD with acute exacerbation (HCC) Active Problems:   Chronic hypoxic respiratory failure (HCC)   Hypothyroidism   Chronic depression   Essential hypertension   Hyperlipidemia   GAD (generalized anxiety disorder)   Continuous dependence on cigarette smoking   History of CAD (coronary artery disease)   CAP (community acquired pneumonia)  Resolved Problems:   * No resolved hospital problems. *  Hospital Course: 68 year old female, with past medical history significant for COPD, continuous tobacco abuse since the age of 80 (patient continues to smoke 1 pack of cigarettes daily), chronic respiratory failure on 2 L of supplemental oxygen via nasal cannula at home, hypothyroidism, hypertension, dyslipidemia, anxiety and depression. Patient was admitted with COPD exacerbation. Apparently, symptoms started about a week ago with wheezing, worsening shortness of breath and cough that is productive of whitish sputum.   Assessment and Plan: COPD exacerbation History of chronic COPD Comment echo pneumonia History of chronic hypoxic respiratory failure 2 to 3 L oxygen at baseline -Nebs DuoNeb, nebs albuterol as needed, IV Solu-Medrol and nebs Pulmicort. -Continue supplemental oxygen. -Patient was counseled to quit tobacco use. Prescribed nicotine patches on d/c -symptoms improved this visit. Taper prednisone on d/c and complete course of abx with doxycycline   History of CAD -Stable.  No chest pain. -Continue Lipitor 80 mg daily, nebivolol 10 mg daily and Brilinta 60 mg twice daily.   Essential hypertension -Blood pressure is controlled. - Continue home regimens include  amlodipine 5 mg, Lasix 20 mg, losartan 75 mg and nebivolol 10 mg.   History of prolonged QT prolongation - Continue to avoid any QT prolonging medication.   Hyperlipidemia - Continue Lipitor and ezetimibe.    Hypothyroidism - Continue levo hyroxine   Chronic smoker -Patient has smoked cigarettes since age of 56. -Patient currently smokes 1 pack of cigarettes daily. -Counseled to quit tobacco use. -prescribed nicotine patches on d/c    Generalized anxiety disorder -Stable.   Insomnia  Prediabetic/glucose intolerance History of prediabetes -Sliding scale insulin coverage.      Consultants:  Procedures performed:   Disposition: Home Diet recommendation:  Regular diet DISCHARGE MEDICATION: Allergies as of 09/05/2023       Reactions   Avocado Anaphylaxis   Latex Shortness Of Breath, Rash   Codeine Nausea Only        Medication List     STOP taking these medications    True Metrix Meter Devi Replaced by: Accu-Chek Guide w/Device Kit       TAKE these medications    Accu-Chek Guide w/Device Kit Use to check blood sugars once a day Replaces: True Metrix Meter Devi   alendronate 35 MG tablet Commonly known as: FOSAMAX Take 1 tablet (35 mg total) by mouth every 7 (seven) days. Take with a full glass of water on an empty stomach. Sit upright for 30-60 mins after taking What changed:  when to take this additional instructions   amLODipine 5 MG tablet Commonly known as: NORVASC Take 1 tablet (5 mg total) by mouth daily. What changed: when to take this   aspirin EC 81 MG tablet Take 81 mg by mouth in the morning.   atorvastatin  80 MG tablet Commonly known as: LIPITOR Take 1 tablet (80 mg total) by mouth daily, please schedule appointment for more refills   Brilinta 60 MG Tabs tablet Generic drug: ticagrelor Take 1 tablet (60 mg total) by mouth 2 (two) times daily.   busPIRone 15 MG tablet Commonly known as: BUSPAR Take 2 tablets (30 mg total) by  mouth in the morning AND 1 tablet (15 mg total) daily at 12 noon AND 1 tablet (15 mg total) every evening. What changed: See the new instructions.   clonazePAM 0.5 MG disintegrating tablet Commonly known as: KLONOPIN Take 1 tablet (0.5 mg total) by mouth 2 (two) times daily.   doxepin 75 MG capsule Commonly known as: SINEQUAN Take 1 capsule (75 mg total) by mouth at bedtime.   doxycycline 100 MG tablet Commonly known as: VIBRA-TABS Take 1 tablet (100 mg total) by mouth 2 (two) times daily for 3 days.   escitalopram 20 MG tablet Commonly known as: LEXAPRO Take 1 tablet (20 mg total) by mouth daily. What changed: when to take this   ezetimibe 10 MG tablet Commonly known as: ZETIA Take 1 tablet (10 mg total) by mouth daily. What changed: when to take this   furosemide 20 MG tablet Commonly known as: LASIX Take 1 tablet (20 mg total) by mouth daily. What changed: when to take this   gabapentin 600 MG tablet Commonly known as: NEURONTIN TAKE 1 TABLET BY MOUTH 4 TIMES DAILY What changed:  how much to take when to take this   ipratropium-albuterol 0.5-2.5 (3) MG/3ML Soln Commonly known as: DUONEB Take 3 mLs by nebulization every 6 (six) hours as needed. What changed: reasons to take this   levothyroxine 112 MCG tablet Commonly known as: SYNTHROID Take 1 tablet (112 mcg total) by mouth daily. What changed: when to take this   losartan 50 MG tablet Commonly known as: COZAAR Take 1.5 tablets (75 mg total) by mouth daily. What changed: when to take this   metFORMIN 500 MG tablet Commonly known as: GLUCOPHAGE Take 1 tablet (500 mg total) by mouth 2 (two) times daily with a meal. What changed: when to take this   mirtazapine 45 MG tablet Commonly known as: REMERON Take 1 tablet (45 mg total) by mouth at bedtime.   montelukast 10 MG tablet Commonly known as: SINGULAIR Take 1 tablet (10 mg total) by mouth at bedtime.   nebivolol 10 MG tablet Commonly known as:  Bystolic Take 1 tablet (10 mg total) by mouth daily. What changed: when to take this   nicotine 21 mg/24hr patch Commonly known as: NICODERM CQ - dosed in mg/24 hours Place 1 patch (21 mg total) onto the skin daily. Start taking on: September 06, 2023   nitroGLYCERIN 0.4 MG SL tablet Commonly known as: NITROSTAT Place 1 tablet (0.4 mg total) under the tongue every 5 (five) minutes as needed for chest pain.   omeprazole 40 MG capsule Commonly known as: PRILOSEC Take 1 capsule (40 mg total) by mouth 2 (two) times daily before a meal.   predniSONE 10 MG tablet Commonly known as: DELTASONE Take 4 tablets (40 mg total) by mouth daily for 3 days, THEN 2 tablets (20 mg total) daily for 3 days, THEN 1 tablet (10 mg total) daily for 3 days, THEN 0.5 tablets (5 mg total) daily for 2 days. Start taking on: September 05, 2023 What changed: See the new instructions.   Spacer/Aero-Holding [employer] Use with the albuterol inhaler.   Trelegy  Ellipta 200-62.5-25 MCG/ACT Aepb Generic drug: Fluticasone-Umeclidin-Vilant Inhale 1 puff into the lungs daily.   triamcinolone 0.025 % ointment Commonly known as: KENALOG Apply 1 Application topically 2 (two) times daily as needed.   True Metrix Blood Glucose Test test strip Generic drug: glucose blood Use as instructed to check blood sugars once a day What changed: additional instructions   TRUEplus Lancets 28G Misc Use to check blood sugars once a day What changed:  how much to take how to take this when to take this additional instructions   Ventolin HFA 108 (90 Base) MCG/ACT inhaler Generic drug: albuterol INHALE 2 PUFFS INTO THE LUNGS EVERY 6 HOURS AS NEEDED FOR WHEEZING OR SHORTNESS OF BREATH   Zoster Vaccine Adjuvanted injection Commonly known as: SHINGRIX Inject 0.5 mLs into the muscle once for 1 dose.        Follow-up Information     Martina Sinner, MD Follow up in 2 week(s).   Specialty: Pulmonary Disease Why:  Hospital follow up Contact information: [address] [place] [address] [phone]                Discharge Exam: Ceasar Mons Weights   08/31/23 2125 09/01/23 1521  Weight: 68 kg 68 kg   General exam: Awake, laying in bed, in nad Respiratory system: Normal respiratory effort, no wheezing Cardiovascular system: regular rate, s1, s2 Gastrointestinal system: Soft, nondistended, positive BS Central nervous system: CN2-12 grossly intact, strength intact Extremities: Perfused, no clubbing Skin: Normal skin turgor, no notable skin lesions seen Psychiatry: Mood normal // no visual hallucinations   Condition at discharge: fair  The results of significant diagnostics from this hospitalization (including imaging, microbiology, ancillary and laboratory) are listed below for reference.   Imaging Studies: DG Chest Portable 1 View Result Date: 08/31/2023 CLINICAL DATA:  Shortness of breath EXAM: PORTABLE CHEST 1 VIEW COMPARISON:  07/21/2023 FINDINGS: Cardiac shadow is within normal limits. Aortic calcifications are noted. Lungs are well aerated bilaterally. No focal infiltrate is seen. Minimal left basilar atelectasis is noted. No bony abnormality is seen. Old left clavicular fracture is noted. IMPRESSION: Mild left basilar atelectasis. Electronically Signed   By: Alcide Clever M.D.   On: 08/31/2023 22:22   NM PET CT CARDIAC PERFUSION MULTI W/ABSOLUTE BLOODFLOW Result Date: 08/20/2023   LV perfusion is normal. There is no evidence of ischemia. There is no evidence of infarction.   Rest left ventricular function is normal. Rest EF: 68%. Stress left ventricular function is normal. Stress EF: 75%. End diastolic cavity size is normal. End systolic cavity size is normal.   Myocardial blood flow was computed to be 0.32ml/g/min at rest and 1.66ml/g/min at stress. Global myocardial blood flow reserve was 2.26 and was normal.   Coronary calcium assessment not performed due to prior  revascularization. Not commented on due to multiple prior stents   The study is normal. The study is low risk. CLINICAL DATA:  This over-read does not include interpretation of cardiac or coronary anatomy or pathology. The Cardiac PET CT interpretation by the cardiologist is attached. COMPARISON:  02/07/2023 FINDINGS: Cardiovascular: Aortic atherosclerosis. Normal heart size. Three-vessel coronary artery calcifications. No pericardial effusion. Limited Mediastinum/Nodes: No enlarged mediastinal, hilar, or axillary lymph nodes. Trachea and esophagus demonstrate no significant findings. Limited Lungs/Pleura: Bandlike bibasilar scarring or atelectasis. No pleural effusion or pneumothorax. Upper Abdomen: No acute abnormality. Musculoskeletal: No chest wall abnormality. No acute osseous findings. IMPRESSION: 1. No acute CT findings of the included chest. 2. Coronary  artery disease. Aortic Atherosclerosis (ICD10-I70.0). Electronically Signed   By: Jearld Lesch M.D.   On: 08/20/2023 13:07   Microbiology: Results for orders placed or performed during the hospital encounter of 08/31/23  Culture, blood (routine x 2) Call MD if unable to obtain prior to antibiotics being given     Status: None (Preliminary result)   Collection Time: 08/31/23  9:28 PM   Specimen: BLOOD  Result Value Ref Range Status   Specimen Description BLOOD RIGHT ANTECUBITAL  Final   Special Requests   Final    BOTTLES DRAWN AEROBIC AND ANAEROBIC Blood Culture adequate volume   Culture   Final    NO GROWTH 4 DAYS Performed at [facility], [address] [place], [place] [address]    Report Status PENDING  Incomplete  Resp panel by RT-PCR (RSV, Flu A&B, Covid) Anterior Nasal Swab     Status: None   Collection Time: 08/31/23  9:32 PM   Specimen: Anterior Nasal Swab  Result Value Ref Range Status   SARS Coronavirus 2 by RT PCR NEGATIVE NEGATIVE Final   Influenza A by PCR NEGATIVE NEGATIVE Final   Influenza B by PCR NEGATIVE  NEGATIVE Final    Comment: (NOTE) The Xpert Xpress SARS-CoV-2/FLU/RSV plus assay is intended as an aid in the diagnosis of influenza from Nasopharyngeal swab specimens and should not be used as a sole basis for treatment. Nasal washings and aspirates are unacceptable for Xpert Xpress SARS-CoV-2/FLU/RSV testing.  Fact Sheet for Patients: BloggerCourse.com  Fact Sheet for Healthcare Providers: SeriousBroker.it  This test is not yet approved or cleared by the [place] FDA and has been authorized for detection and/or diagnosis of SARS-CoV-2 by FDA under an Emergency Use Authorization (EUA). This EUA will remain in effect (meaning this test can be used) for the duration of the COVID-19 declaration under Section 564(b)(1) of the Act, 21 U.S.C. section 360bbb-3(b)(1), unless the authorization is terminated or revoked.     Resp Syncytial Virus by PCR NEGATIVE NEGATIVE Final    Comment: (NOTE) Fact Sheet for Patients: BloggerCourse.com  Fact Sheet for Healthcare Providers: SeriousBroker.it  This test is not yet approved or cleared by the [place] FDA and has been authorized for detection and/or diagnosis of SARS-CoV-2 by FDA under an Emergency Use Authorization (EUA). This EUA will remain in effect (meaning this test can be used) for the duration of the COVID-19 declaration under Section 564(b)(1) of the Act, 21 U.S.C. section 360bbb-3(b)(1), unless the authorization is terminated or revoked.  Performed at [facility], [address], [place], [place] [address]   Respiratory (~20 pathogens) panel by PCR     Status: None   Collection Time: 09/01/23 12:53 AM   Specimen: Nasopharyngeal Swab; Respiratory  Result Value Ref Range Status   Adenovirus NOT DETECTED NOT DETECTED Final   Coronavirus 229E NOT DETECTED NOT DETECTED Final    Comment: (NOTE) The Coronavirus on  the Respiratory Panel, DOES NOT test for the novel  Coronavirus (2019 nCoV)    Coronavirus HKU1 NOT DETECTED NOT DETECTED Final   Coronavirus NL63 NOT DETECTED NOT DETECTED Final   Coronavirus OC43 NOT DETECTED NOT DETECTED Final   Metapneumovirus NOT DETECTED NOT DETECTED Final   Rhinovirus / Enterovirus NOT DETECTED NOT DETECTED Final   Influenza A NOT DETECTED NOT DETECTED Final   Influenza B NOT DETECTED NOT DETECTED Final   Parainfluenza Virus 1 NOT DETECTED NOT DETECTED Final   Parainfluenza Virus 2 NOT DETECTED NOT  DETECTED Final   Parainfluenza Virus 3 NOT DETECTED NOT DETECTED Final   Parainfluenza Virus 4 NOT DETECTED NOT DETECTED Final   Respiratory Syncytial Virus NOT DETECTED NOT DETECTED Final   Bordetella pertussis NOT DETECTED NOT DETECTED Final   Bordetella Parapertussis NOT DETECTED NOT DETECTED Final   Chlamydophila pneumoniae NOT DETECTED NOT DETECTED Final   Mycoplasma pneumoniae NOT DETECTED NOT DETECTED Final    Comment: Performed at [facility], [address] [place], [place] [address]  Culture, blood (routine x 2) Call MD if unable to obtain prior to antibiotics being given     Status: None (Preliminary result)   Collection Time: 09/01/23  4:10 AM   Specimen: BLOOD  Result Value Ref Range Status   Specimen Description BLOOD LEFT ANTECUBITAL  Final   Special Requests   Final    BOTTLES DRAWN AEROBIC AND ANAEROBIC Blood Culture adequate volume   Culture   Final    NO GROWTH 4 DAYS Performed at [facility], [address], [place], [place] [address]    Report Status PENDING  Incomplete   *Note: Due to a large number of results and/or encounters for the requested time period, some results have not been displayed. A complete set of results can be found in Results Review.    Labs: CBC: Recent Labs  Lab 08/31/23 2130 09/01/23 0527  WBC 15.5* 12.5*  NEUTROABS 11.0*  --   HGB 14.7 13.5  HCT 44.6 41.1  MCV 99.1 98.1  PLT 443* 430*    Basic Metabolic Panel: Recent Labs  Lab 08/31/23 2320 09/01/23 0527  NA 139 138  K 4.1 4.5  CL 102 102  CO2 26 25  GLUCOSE 161* 180*  BUN 8 10  CREATININE 0.70 0.56  CALCIUM 9.3 8.5*   Liver Function Tests: Recent Labs  Lab 09/01/23 0527  AST 16  ALT 18  ALKPHOS 60  BILITOT 0.7  PROT 5.9*  ALBUMIN 3.0*   CBG: Recent Labs  Lab 09/04/23 1258 09/04/23 1621 09/04/23 2015 09/05/23 0838 09/05/23 1202  GLUCAP 196* 122* 145* 235* 114*    Discharge time spent: less than 30 minutes.  Signed: Rickey Barbara, MD [facility] 09/05/2023

## 2023-09-05 NOTE — Care Management Important Message (Signed)
Important Message  Patient Details  Name: [name] [name] MRN: [id number] Date of Birth: January 03, 1956   Important Message Given:  Yes - Medicare IM     Sherilyn Banker 09/05/2023, 3:29 PM

## 2023-09-05 NOTE — Progress Notes (Signed)
Reviewed AVS, patient expressed understanding of medications, MD follow up reviewed.   Removed IV, Site clean, dry and intact.  Patient states all belongings brought to the hospital at time of admission are accounted for and packed to take home.  Picked up medications from [facility] pharmacy. Pt transported to entrance A where family member was waiting in vehicle to transport home.

## 2023-09-05 NOTE — Plan of Care (Signed)
   Problem: Health Behavior/Discharge Planning: Goal: Ability to manage health-related needs will improve Outcome: Progressing   Problem: Clinical Measurements: Goal: Ability to maintain clinical measurements within normal limits will improve Outcome: Progressing Goal: Will remain free from infection Outcome: Progressing

## 2023-09-05 NOTE — Plan of Care (Incomplete)
Problem: Education: Goal: Knowledge of General Education information will improve Description: Including pain rating scale, medication(s)/side effects and non-pharmacologic comfort measures Outcome: Adequate for Discharge   Problem: Health Behavior/Discharge Planning: Goal: Ability to manage health-related needs will improve Outcome: Adequate for Discharge   Problem: Clinical Measurements: Goal: Ability to maintain clinical measurements within normal limits will improve Outcome: Adequate for Discharge Goal: Will remain free from infection Outcome: Adequate for Discharge Goal: Diagnostic test results will improve Outcome: Adequate for Discharge Goal: Respiratory complications will improve Outcome: Adequate for Discharge Goal: Cardiovascular complication will be avoided Outcome: Adequate for Discharge   Problem: Activity: Goal: Risk for activity intolerance will decrease Outcome: Adequate for Discharge   Problem: Nutrition: Goal: Adequate nutrition will be maintained Outcome: Adequate for Discharge   Problem: Coping: Goal: Level of anxiety will decrease Outcome: Adequate for Discharge   Problem: Elimination: Goal: Will not experience complications related to bowel motility Outcome: Adequate for Discharge Goal: Will not experience complications related to urinary retention Outcome: Adequate for Discharge   Problem: Pain Managment: Goal: General experience of comfort will improve and/or be controlled Outcome: Adequate for Discharge   Problem: Safety: Goal: Ability to remain free from injury will improve Outcome: Adequate for Discharge   Problem: Skin Integrity: Goal: Risk for impaired skin integrity will decrease Outcome: Adequate for Discharge   Problem: Education: Goal: Knowledge of disease or condition will improve Outcome: Adequate for Discharge Goal: Knowledge of the prescribed therapeutic regimen will improve Outcome: Adequate for Discharge Goal:  Individualized Educational Video(s) Outcome: Adequate for Discharge   Problem: Activity: Goal: Ability to tolerate increased activity will improve Outcome: Adequate for Discharge Goal: Will verbalize the importance of balancing activity with adequate rest periods Outcome: Adequate for Discharge   Problem: Respiratory: Goal: Ability to maintain a clear airway will improve Outcome: Adequate for Discharge Goal: Levels of oxygenation will improve Outcome: Adequate for Discharge Goal: Ability to maintain adequate ventilation will improve Outcome: Adequate for Discharge   Problem: Activity: Goal: Ability to tolerate increased activity will improve Outcome: Adequate for Discharge   Problem: Clinical Measurements: Goal: Ability to maintain a body temperature in the normal range will improve Outcome: Adequate for Discharge   Problem: Respiratory: Goal: Ability to maintain adequate ventilation will improve Outcome: Adequate for Discharge Goal: Ability to maintain a clear airway will improve Outcome: Adequate for Discharge   Problem: Education: Goal: Knowledge of General Education information will improve Description: Including pain rating scale, medication(s)/side effects and non-pharmacologic comfort measures Outcome: Adequate for Discharge   Problem: Health Behavior/Discharge Planning: Goal: Ability to manage health-related needs will improve Outcome: Adequate for Discharge   Problem: Clinical Measurements: Goal: Ability to maintain clinical measurements within normal limits will improve Outcome: Adequate for Discharge Goal: Will remain free from infection Outcome: Adequate for Discharge Goal: Diagnostic test results will improve Outcome: Adequate for Discharge Goal: Respiratory complications will improve Outcome: Adequate for Discharge Goal: Cardiovascular complication will be avoided Outcome: Adequate for Discharge   Problem: Activity: Goal: Risk for activity  intolerance will decrease Outcome: Adequate for Discharge   Problem: Nutrition: Goal: Adequate nutrition will be maintained Outcome: Adequate for Discharge   Problem: Coping: Goal: Level of anxiety will decrease Outcome: Adequate for Discharge   Problem: Elimination: Goal: Will not experience complications related to bowel motility Outcome: Adequate for Discharge Goal: Will not experience complications related to urinary retention Outcome: Adequate for Discharge   Problem: Pain Managment: Goal: General experience of comfort will improve and/or be controlled Outcome: Adequate for  Discharge   Problem: Safety: Goal: Ability to remain free from injury will improve Outcome: Adequate for Discharge   Problem: Skin Integrity: Goal: Risk for impaired skin integrity will decrease Outcome: Adequate for Discharge   Problem: Education: Goal: Ability to describe self-care measures that may prevent or decrease complications (Diabetes Survival Skills Education) will improve Outcome: Adequate for Discharge Goal: Individualized Educational Video(s) Outcome: Adequate for Discharge   Problem: Coping: Goal: Ability to adjust to condition or change in health will improve Outcome: Adequate for Discharge   Problem: Fluid Volume: Goal: Ability to maintain a balanced intake and output will improve Outcome: Adequate for Discharge

## 2023-09-06 LAB — CULTURE, BLOOD (ROUTINE X 2)
Culture: NO GROWTH
Culture: NO GROWTH
Special Requests: ADEQUATE
Special Requests: ADEQUATE

## 2023-09-06 LAB — LEGIONELLA PNEUMOPHILA SEROGP 1 UR AG: L. pneumophila Serogp 1 Ur Ag: NEGATIVE

## 2023-09-08 ENCOUNTER — Telehealth: Payer: Self-pay

## 2023-09-08 ENCOUNTER — Other Ambulatory Visit: Payer: Self-pay | Admitting: Physician Assistant

## 2023-09-08 ENCOUNTER — Other Ambulatory Visit: Payer: Self-pay

## 2023-09-08 ENCOUNTER — Other Ambulatory Visit (HOSPITAL_COMMUNITY): Payer: Self-pay

## 2023-09-08 DIAGNOSIS — K219 Gastro-esophageal reflux disease without esophagitis: Secondary | ICD-10-CM

## 2023-09-08 MED ORDER — OMEPRAZOLE 40 MG PO CPDR
40.0000 mg | DELAYED_RELEASE_CAPSULE | Freq: Two times a day (BID) | ORAL | 1 refills | Status: DC
Start: 2023-09-08 — End: 2023-11-06
  Filled 2023-09-08: qty 60, 30d supply, fill #0
  Filled 2023-10-07: qty 60, 30d supply, fill #1

## 2023-09-08 NOTE — Transitions of Care (Post Inpatient/ED Visit) (Signed)
   09/08/2023  Name: KANDISE RIEHLE MRN: [id number] DOB: [birth year]  Today's TOC FU Call Status: Today's TOC FU Call Status:: Unsuccessful Call (1st Attempt) Unsuccessful Call (1st Attempt) Date: 09/08/23  Attempted to reach the patient regarding the most recent Inpatient/ED visit.  Follow Up Plan: Additional outreach attempts will be made to reach the patient to complete the Transitions of Care (Post Inpatient/ED visit) call.   Shandora Koogler A. Mliss Fritz RN, BA, [facility], CRRN [facility] RN Care Manager, Transition of Care [phone]

## 2023-09-09 ENCOUNTER — Telehealth: Payer: Self-pay

## 2023-09-09 ENCOUNTER — Other Ambulatory Visit (HOSPITAL_COMMUNITY): Payer: Self-pay

## 2023-09-09 DIAGNOSIS — I2510 Atherosclerotic heart disease of native coronary artery without angina pectoris: Secondary | ICD-10-CM | POA: Diagnosis not present

## 2023-09-09 DIAGNOSIS — J449 Chronic obstructive pulmonary disease, unspecified: Secondary | ICD-10-CM | POA: Diagnosis not present

## 2023-09-09 DIAGNOSIS — I5032 Chronic diastolic (congestive) heart failure: Secondary | ICD-10-CM | POA: Diagnosis not present

## 2023-09-09 DIAGNOSIS — F419 Anxiety disorder, unspecified: Secondary | ICD-10-CM | POA: Diagnosis not present

## 2023-09-09 DIAGNOSIS — E039 Hypothyroidism, unspecified: Secondary | ICD-10-CM | POA: Diagnosis not present

## 2023-09-09 DIAGNOSIS — F32A Depression, unspecified: Secondary | ICD-10-CM | POA: Diagnosis not present

## 2023-09-09 DIAGNOSIS — I110 Hypertensive heart disease with heart failure: Secondary | ICD-10-CM | POA: Diagnosis not present

## 2023-09-09 DIAGNOSIS — R7303 Prediabetes: Secondary | ICD-10-CM | POA: Diagnosis not present

## 2023-09-09 DIAGNOSIS — F17210 Nicotine dependence, cigarettes, uncomplicated: Secondary | ICD-10-CM | POA: Diagnosis not present

## 2023-09-09 NOTE — Transitions of Care (Post Inpatient/ED Visit) (Signed)
   09/09/2023  Name: [name] [name] MRN: [id number] DOB: 08/28/1955  Today's TOC FU Call Status: Today's TOC FU Call Status:: Unsuccessful Call (2nd Attempt) Unsuccessful Call (2nd Attempt) Date: 09/09/23  Attempted to reach the patient regarding the most recent Inpatient/ED visit.  Follow Up Plan: Additional outreach attempts will be made to reach the patient to complete the Transitions of Care (Post Inpatient/ED visit) call.   Alyse Kathan A. Mliss Fritz RN, BA, [facility], CRRN [facility] RN Care Manager, Transition of Care [phone]

## 2023-09-10 ENCOUNTER — Telehealth: Payer: Self-pay

## 2023-09-10 DIAGNOSIS — F17210 Nicotine dependence, cigarettes, uncomplicated: Secondary | ICD-10-CM | POA: Diagnosis not present

## 2023-09-10 DIAGNOSIS — F419 Anxiety disorder, unspecified: Secondary | ICD-10-CM | POA: Diagnosis not present

## 2023-09-10 DIAGNOSIS — J449 Chronic obstructive pulmonary disease, unspecified: Secondary | ICD-10-CM | POA: Diagnosis not present

## 2023-09-10 DIAGNOSIS — I2510 Atherosclerotic heart disease of native coronary artery without angina pectoris: Secondary | ICD-10-CM | POA: Diagnosis not present

## 2023-09-10 DIAGNOSIS — I5032 Chronic diastolic (congestive) heart failure: Secondary | ICD-10-CM | POA: Diagnosis not present

## 2023-09-10 DIAGNOSIS — E039 Hypothyroidism, unspecified: Secondary | ICD-10-CM | POA: Diagnosis not present

## 2023-09-10 DIAGNOSIS — F32A Depression, unspecified: Secondary | ICD-10-CM | POA: Diagnosis not present

## 2023-09-10 DIAGNOSIS — R7303 Prediabetes: Secondary | ICD-10-CM | POA: Diagnosis not present

## 2023-09-10 DIAGNOSIS — I110 Hypertensive heart disease with heart failure: Secondary | ICD-10-CM | POA: Diagnosis not present

## 2023-09-10 NOTE — Transitions of Care (Post Inpatient/ED Visit) (Signed)
   09/10/2023  Name: [name] [name] MRN: [id number] DOB: Nov 17, 1955  Today's TOC FU Call Status: Today's TOC FU Call Status:: Unsuccessful Call (3rd Attempt) Unsuccessful Call (3rd Attempt) Date: 09/10/23  Attempted to reach the patient regarding the most recent Inpatient/ED visit.  Follow Up Plan: No further outreach attempts will be made at this time. We have been unable to contact the patient.  Keirsten Matuska A. Mliss Fritz RN, BA, [facility], CRRN [facility] RN Care Manager, Transition of Care [phone]

## 2023-09-11 ENCOUNTER — Telehealth: Payer: Self-pay

## 2023-09-11 NOTE — Telephone Encounter (Signed)
 FYI   Copied from CRM [id number]. Topic: General - Other >> Sep 11, 2023  9:20 AM Desma Mcgregor wrote: Reason for CRM: Marcelino Duster calling to inform to the Dr. Laural Benes know that she assessed the patient yesterday and she is doing the same and appears to need no further services.

## 2023-09-12 ENCOUNTER — Telehealth: Payer: Self-pay

## 2023-09-12 ENCOUNTER — Encounter: Payer: Self-pay | Admitting: Internal Medicine

## 2023-09-12 ENCOUNTER — Ambulatory Visit: Payer: Self-pay | Admitting: Pulmonary Disease

## 2023-09-12 NOTE — Telephone Encounter (Signed)
 Phone call returned to patient today.  I left a message on her voicemail informing her that I received a message about swelling in the legs.  I recommend that she increase the furosemide to 2 tablets daily for 3 days then go back to 1 tablet daily.

## 2023-09-12 NOTE — Telephone Encounter (Signed)
 Chief Complaint: Leg swelling  Symptoms: bilateral leg swelling, pitting edema, about a 2lb weight gain over night Frequency: comes and goes  Pertinent Negatives: Patient denies pain, chest pain, fever, SOB,  Disposition: [] ED /[] Urgent Care (no appt availability in office) / [] Appointment(In office/virtual)/ []  [place] [facility]/ [] Home Care/ [] Refused Recommended Disposition /[]  Mobile Bus/ [x]  Follow-up with PCP Additional Notes: Patient's home health nurse called stated the patient has bilateral leg swelling that was reported by the daughter. Patient does not usually have leg swelling and she is taking Lasix medication as ordered. The patient's daughter stated she weighed the patient an got a of  153.7, the next day she did the same and the weight was  155.8 lbs.  Care advice was given to the home health nurse and advised a message would be forwarded to Dr. Laural Benes for additional recommendations. Patient has appointment scheduled 09/19/23 with Dr. Laural Benes.   Reason for Disposition  [1] MODERATE leg swelling (e.g., swelling extends up to knees) AND [2] new-onset or worsening  Answer Assessment - Initial Assessment Questions 1. ONSET: "When did the swelling start?" (e.g., minutes, hours, days)     2 days  2. LOCATION: "What part of the leg is swollen?"  "Are both legs swollen or just one leg?"     Bilateral  3. SEVERITY: "How bad is the swelling?" (e.g., localized; mild, moderate, severe)   - Localized: Small area of swelling localized to one leg.   - MILD pedal edema: Swelling limited to foot and ankle, pitting edema < 1/4 inch (6 mm) deep, rest and elevation eliminate most or all swelling.   - MODERATE edema: Swelling of lower leg to knee, pitting edema > 1/4 inch (6 mm) deep, rest and elevation only partially reduce swelling.   - SEVERE edema: Swelling extends above knee, facial or hand swelling present.      Moderate to severe  4. REDNESS: "Does the swelling look red or  infected?"     No 5. PAIN: "Is the swelling painful to touch?" If Yes, ask: "How painful is it?"   (Scale 1-10; mild, moderate or severe)     Mild 6. FEVER: "Do you have a fever?" If Yes, ask: "What is it, how was it measured, and when did it start?"      No 7. CAUSE: "What do you think is causing the leg swelling?"     Unsure  8. MEDICAL HISTORY: "Do you have a history of blood clots (e.g., DVT), cancer, heart failure, kidney disease, or liver failure?"     Heart failure  9. RECURRENT SYMPTOM: "Have you had leg swelling before?" If Yes, ask: "When was the last time?" "What happened that time?"     No  10. OTHER SYMPTOMS: "Do you have any other symptoms?" (e.g., chest pain, difficulty breathing)       Weight of 2.5lbs over night  Protocols used: Leg Swelling and Edema-A-AH

## 2023-09-12 NOTE — Telephone Encounter (Signed)
 Patient is no longer under Dr.Johnson's care  Copied from CRM [id number]. Topic: Clinical - Home Health Verbal Orders >> Sep 09, 2023 12:13 PM Fonda Kinder J wrote: Caller/Agency: [facility]  Callback Number: [phone] Service Requested: Skilled Nursing/COPD mangmnt. Frequency: 1x week/5 weeks  Any new concerns about the patient? No, breathing is still poor

## 2023-09-15 DIAGNOSIS — R7303 Prediabetes: Secondary | ICD-10-CM | POA: Diagnosis not present

## 2023-09-15 DIAGNOSIS — J449 Chronic obstructive pulmonary disease, unspecified: Secondary | ICD-10-CM | POA: Diagnosis not present

## 2023-09-15 DIAGNOSIS — I2510 Atherosclerotic heart disease of native coronary artery without angina pectoris: Secondary | ICD-10-CM | POA: Diagnosis not present

## 2023-09-15 DIAGNOSIS — F17210 Nicotine dependence, cigarettes, uncomplicated: Secondary | ICD-10-CM | POA: Diagnosis not present

## 2023-09-15 DIAGNOSIS — F32A Depression, unspecified: Secondary | ICD-10-CM | POA: Diagnosis not present

## 2023-09-15 DIAGNOSIS — I5032 Chronic diastolic (congestive) heart failure: Secondary | ICD-10-CM | POA: Diagnosis not present

## 2023-09-15 DIAGNOSIS — F419 Anxiety disorder, unspecified: Secondary | ICD-10-CM | POA: Diagnosis not present

## 2023-09-15 DIAGNOSIS — E039 Hypothyroidism, unspecified: Secondary | ICD-10-CM | POA: Diagnosis not present

## 2023-09-15 DIAGNOSIS — I110 Hypertensive heart disease with heart failure: Secondary | ICD-10-CM | POA: Diagnosis not present

## 2023-09-16 ENCOUNTER — Telehealth: Payer: Self-pay

## 2023-09-16 ENCOUNTER — Emergency Department (HOSPITAL_COMMUNITY)

## 2023-09-16 ENCOUNTER — Inpatient Hospital Stay (HOSPITAL_COMMUNITY)
Admission: EM | Admit: 2023-09-16 | Discharge: 2023-09-25 | DRG: 199 | Disposition: A | Discharge status: Skilled Nursing Facility | Attending: General Surgery | Admitting: General Surgery

## 2023-09-16 ENCOUNTER — Other Ambulatory Visit: Payer: Self-pay

## 2023-09-16 ENCOUNTER — Other Ambulatory Visit (HOSPITAL_COMMUNITY): Payer: Self-pay

## 2023-09-16 ENCOUNTER — Encounter (HOSPITAL_COMMUNITY): Payer: Self-pay

## 2023-09-16 ENCOUNTER — Other Ambulatory Visit: Payer: Self-pay | Admitting: Internal Medicine

## 2023-09-16 DIAGNOSIS — R0989 Other specified symptoms and signs involving the circulatory and respiratory systems: Secondary | ICD-10-CM | POA: Diagnosis not present

## 2023-09-16 DIAGNOSIS — J189 Pneumonia, unspecified organism: Secondary | ICD-10-CM | POA: Diagnosis not present

## 2023-09-16 DIAGNOSIS — F32A Depression, unspecified: Secondary | ICD-10-CM | POA: Diagnosis present

## 2023-09-16 DIAGNOSIS — R54 Age-related physical debility: Secondary | ICD-10-CM | POA: Diagnosis present

## 2023-09-16 DIAGNOSIS — K219 Gastro-esophageal reflux disease without esophagitis: Secondary | ICD-10-CM | POA: Diagnosis present

## 2023-09-16 DIAGNOSIS — Z515 Encounter for palliative care: Secondary | ICD-10-CM | POA: Diagnosis not present

## 2023-09-16 DIAGNOSIS — W19XXXA Unspecified fall, initial encounter: Principal | ICD-10-CM

## 2023-09-16 DIAGNOSIS — R0603 Acute respiratory distress: Secondary | ICD-10-CM | POA: Diagnosis not present

## 2023-09-16 DIAGNOSIS — Z8249 Family history of ischemic heart disease and other diseases of the circulatory system: Secondary | ICD-10-CM

## 2023-09-16 DIAGNOSIS — Z91018 Allergy to other foods: Secondary | ICD-10-CM

## 2023-09-16 DIAGNOSIS — W06XXXA Fall from bed, initial encounter: Secondary | ICD-10-CM | POA: Diagnosis present

## 2023-09-16 DIAGNOSIS — R531 Weakness: Secondary | ICD-10-CM | POA: Diagnosis not present

## 2023-09-16 DIAGNOSIS — T797XXA Traumatic subcutaneous emphysema, initial encounter: Secondary | ICD-10-CM | POA: Diagnosis not present

## 2023-09-16 DIAGNOSIS — Z860100 Personal history of colon polyps, unspecified: Secondary | ICD-10-CM

## 2023-09-16 DIAGNOSIS — J90 Pleural effusion, not elsewhere classified: Secondary | ICD-10-CM | POA: Diagnosis not present

## 2023-09-16 DIAGNOSIS — E039 Hypothyroidism, unspecified: Secondary | ICD-10-CM | POA: Diagnosis present

## 2023-09-16 DIAGNOSIS — R918 Other nonspecific abnormal finding of lung field: Secondary | ICD-10-CM | POA: Diagnosis not present

## 2023-09-16 DIAGNOSIS — R7303 Prediabetes: Secondary | ICD-10-CM | POA: Diagnosis present

## 2023-09-16 DIAGNOSIS — Z4682 Encounter for fitting and adjustment of non-vascular catheter: Secondary | ICD-10-CM | POA: Diagnosis not present

## 2023-09-16 DIAGNOSIS — Z66 Do not resuscitate: Secondary | ICD-10-CM | POA: Diagnosis present

## 2023-09-16 DIAGNOSIS — Z823 Family history of stroke: Secondary | ICD-10-CM

## 2023-09-16 DIAGNOSIS — Z7984 Long term (current) use of oral hypoglycemic drugs: Secondary | ICD-10-CM | POA: Diagnosis not present

## 2023-09-16 DIAGNOSIS — I2510 Atherosclerotic heart disease of native coronary artery without angina pectoris: Secondary | ICD-10-CM | POA: Diagnosis present

## 2023-09-16 DIAGNOSIS — R339 Retention of urine, unspecified: Secondary | ICD-10-CM | POA: Diagnosis not present

## 2023-09-16 DIAGNOSIS — J432 Centrilobular emphysema: Secondary | ICD-10-CM | POA: Diagnosis not present

## 2023-09-16 DIAGNOSIS — M85859 Other specified disorders of bone density and structure, unspecified thigh: Secondary | ICD-10-CM

## 2023-09-16 DIAGNOSIS — F17290 Nicotine dependence, other tobacco product, uncomplicated: Secondary | ICD-10-CM | POA: Diagnosis present

## 2023-09-16 DIAGNOSIS — J1569 Pneumonia due to other gram-negative bacteria: Secondary | ICD-10-CM | POA: Diagnosis present

## 2023-09-16 DIAGNOSIS — Z818 Family history of other mental and behavioral disorders: Secondary | ICD-10-CM

## 2023-09-16 DIAGNOSIS — S199XXA Unspecified injury of neck, initial encounter: Secondary | ICD-10-CM | POA: Diagnosis not present

## 2023-09-16 DIAGNOSIS — Z803 Family history of malignant neoplasm of breast: Secondary | ICD-10-CM

## 2023-09-16 DIAGNOSIS — J440 Chronic obstructive pulmonary disease with acute lower respiratory infection: Secondary | ICD-10-CM | POA: Diagnosis present

## 2023-09-16 DIAGNOSIS — Z9981 Dependence on supplemental oxygen: Secondary | ICD-10-CM

## 2023-09-16 DIAGNOSIS — J9811 Atelectasis: Secondary | ICD-10-CM | POA: Diagnosis not present

## 2023-09-16 DIAGNOSIS — J9621 Acute and chronic respiratory failure with hypoxia: Secondary | ICD-10-CM | POA: Diagnosis present

## 2023-09-16 DIAGNOSIS — Z7983 Long term (current) use of bisphosphonates: Secondary | ICD-10-CM

## 2023-09-16 DIAGNOSIS — I700 Atherosclerosis of aorta: Secondary | ICD-10-CM | POA: Diagnosis not present

## 2023-09-16 DIAGNOSIS — J441 Chronic obstructive pulmonary disease with (acute) exacerbation: Secondary | ICD-10-CM | POA: Diagnosis present

## 2023-09-16 DIAGNOSIS — E1122 Type 2 diabetes mellitus with diabetic chronic kidney disease: Secondary | ICD-10-CM | POA: Diagnosis present

## 2023-09-16 DIAGNOSIS — Z5986 Financial insecurity: Secondary | ICD-10-CM

## 2023-09-16 DIAGNOSIS — Z711 Person with feared health complaint in whom no diagnosis is made: Secondary | ICD-10-CM | POA: Diagnosis not present

## 2023-09-16 DIAGNOSIS — J939 Pneumothorax, unspecified: Secondary | ICD-10-CM

## 2023-09-16 DIAGNOSIS — Z79899 Other long term (current) drug therapy: Secondary | ICD-10-CM

## 2023-09-16 DIAGNOSIS — Z555 Less than a high school diploma: Secondary | ICD-10-CM

## 2023-09-16 DIAGNOSIS — R52 Pain, unspecified: Secondary | ICD-10-CM | POA: Diagnosis not present

## 2023-09-16 DIAGNOSIS — S2241XD Multiple fractures of ribs, right side, subsequent encounter for fracture with routine healing: Secondary | ICD-10-CM | POA: Diagnosis not present

## 2023-09-16 DIAGNOSIS — N179 Acute kidney failure, unspecified: Secondary | ICD-10-CM | POA: Diagnosis not present

## 2023-09-16 DIAGNOSIS — Z7902 Long term (current) use of antithrombotics/antiplatelets: Secondary | ICD-10-CM | POA: Diagnosis not present

## 2023-09-16 DIAGNOSIS — S0990XA Unspecified injury of head, initial encounter: Secondary | ICD-10-CM | POA: Diagnosis not present

## 2023-09-16 DIAGNOSIS — S270XXA Traumatic pneumothorax, initial encounter: Principal | ICD-10-CM

## 2023-09-16 DIAGNOSIS — Z789 Other specified health status: Secondary | ICD-10-CM | POA: Diagnosis not present

## 2023-09-16 DIAGNOSIS — S2241XA Multiple fractures of ribs, right side, initial encounter for closed fracture: Secondary | ICD-10-CM | POA: Diagnosis present

## 2023-09-16 DIAGNOSIS — M79603 Pain in arm, unspecified: Secondary | ICD-10-CM | POA: Diagnosis not present

## 2023-09-16 DIAGNOSIS — Z8679 Personal history of other diseases of the circulatory system: Secondary | ICD-10-CM | POA: Diagnosis not present

## 2023-09-16 DIAGNOSIS — E785 Hyperlipidemia, unspecified: Secondary | ICD-10-CM | POA: Diagnosis present

## 2023-09-16 DIAGNOSIS — Z7401 Bed confinement status: Secondary | ICD-10-CM | POA: Diagnosis not present

## 2023-09-16 DIAGNOSIS — J9611 Chronic respiratory failure with hypoxia: Secondary | ICD-10-CM | POA: Diagnosis not present

## 2023-09-16 DIAGNOSIS — Y92009 Unspecified place in unspecified non-institutional (private) residence as the place of occurrence of the external cause: Secondary | ICD-10-CM | POA: Diagnosis not present

## 2023-09-16 DIAGNOSIS — F17210 Nicotine dependence, cigarettes, uncomplicated: Secondary | ICD-10-CM | POA: Diagnosis present

## 2023-09-16 DIAGNOSIS — Z833 Family history of diabetes mellitus: Secondary | ICD-10-CM

## 2023-09-16 DIAGNOSIS — Z91040 Latex allergy status: Secondary | ICD-10-CM

## 2023-09-16 DIAGNOSIS — I959 Hypotension, unspecified: Secondary | ICD-10-CM | POA: Diagnosis not present

## 2023-09-16 DIAGNOSIS — E1142 Type 2 diabetes mellitus with diabetic polyneuropathy: Secondary | ICD-10-CM | POA: Diagnosis not present

## 2023-09-16 DIAGNOSIS — F419 Anxiety disorder, unspecified: Secondary | ICD-10-CM | POA: Diagnosis present

## 2023-09-16 DIAGNOSIS — J984 Other disorders of lung: Secondary | ICD-10-CM | POA: Diagnosis not present

## 2023-09-16 DIAGNOSIS — J449 Chronic obstructive pulmonary disease, unspecified: Secondary | ICD-10-CM | POA: Diagnosis not present

## 2023-09-16 DIAGNOSIS — Z885 Allergy status to narcotic agent status: Secondary | ICD-10-CM

## 2023-09-16 DIAGNOSIS — Z83719 Family history of colon polyps, unspecified: Secondary | ICD-10-CM

## 2023-09-16 DIAGNOSIS — E119 Type 2 diabetes mellitus without complications: Secondary | ICD-10-CM | POA: Diagnosis not present

## 2023-09-16 DIAGNOSIS — Z5941 Food insecurity: Secondary | ICD-10-CM

## 2023-09-16 DIAGNOSIS — R278 Other lack of coordination: Secondary | ICD-10-CM | POA: Diagnosis not present

## 2023-09-16 DIAGNOSIS — I10 Essential (primary) hypertension: Secondary | ICD-10-CM | POA: Diagnosis present

## 2023-09-16 DIAGNOSIS — S2249XA Multiple fractures of ribs, unspecified side, initial encounter for closed fracture: Secondary | ICD-10-CM | POA: Diagnosis not present

## 2023-09-16 DIAGNOSIS — R079 Chest pain, unspecified: Secondary | ICD-10-CM | POA: Diagnosis not present

## 2023-09-16 DIAGNOSIS — Z955 Presence of coronary angioplasty implant and graft: Secondary | ICD-10-CM

## 2023-09-16 DIAGNOSIS — K5730 Diverticulosis of large intestine without perforation or abscess without bleeding: Secondary | ICD-10-CM | POA: Diagnosis not present

## 2023-09-16 DIAGNOSIS — Z79890 Hormone replacement therapy (postmenopausal): Secondary | ICD-10-CM

## 2023-09-16 DIAGNOSIS — I9589 Other hypotension: Secondary | ICD-10-CM | POA: Diagnosis not present

## 2023-09-16 DIAGNOSIS — R0789 Other chest pain: Secondary | ICD-10-CM | POA: Diagnosis not present

## 2023-09-16 DIAGNOSIS — I252 Old myocardial infarction: Secondary | ICD-10-CM

## 2023-09-16 DIAGNOSIS — Z7951 Long term (current) use of inhaled steroids: Secondary | ICD-10-CM | POA: Diagnosis not present

## 2023-09-16 DIAGNOSIS — Z7982 Long term (current) use of aspirin: Secondary | ICD-10-CM

## 2023-09-16 DIAGNOSIS — Z5948 Other specified lack of adequate food: Secondary | ICD-10-CM

## 2023-09-16 DIAGNOSIS — M25519 Pain in unspecified shoulder: Secondary | ICD-10-CM | POA: Diagnosis not present

## 2023-09-16 DIAGNOSIS — Z87440 Personal history of urinary (tract) infections: Secondary | ICD-10-CM

## 2023-09-16 DIAGNOSIS — R338 Other retention of urine: Secondary | ICD-10-CM | POA: Diagnosis not present

## 2023-09-16 DIAGNOSIS — Z7189 Other specified counseling: Secondary | ICD-10-CM | POA: Diagnosis not present

## 2023-09-16 DIAGNOSIS — Z90710 Acquired absence of both cervix and uterus: Secondary | ICD-10-CM

## 2023-09-16 DIAGNOSIS — F411 Generalized anxiety disorder: Secondary | ICD-10-CM | POA: Diagnosis not present

## 2023-09-16 DIAGNOSIS — Z808 Family history of malignant neoplasm of other organs or systems: Secondary | ICD-10-CM

## 2023-09-16 DIAGNOSIS — M549 Dorsalgia, unspecified: Secondary | ICD-10-CM | POA: Diagnosis not present

## 2023-09-16 LAB — BASIC METABOLIC PANEL
Anion gap: 8 (ref 5–15)
BUN: 8 mg/dL (ref 8–23)
CO2: 26 mmol/L (ref 22–32)
Calcium: 9.2 mg/dL (ref 8.9–10.3)
Chloride: 106 mmol/L (ref 98–111)
Creatinine, Ser: 0.95 mg/dL (ref 0.44–1.00)
GFR, Estimated: >60 mL/min (ref >60)
Glucose, Bld: 130 mg/dL — ABNORMAL HIGH (ref 70–99)
Potassium: 4.1 mmol/L (ref 3.5–5.1)
Sodium: 140 mmol/L (ref 135–145)

## 2023-09-16 LAB — I-STAT CHEM 8, ED
BUN: 9 mg/dL (ref 8–23)
Calcium, Ion: 1.13 mmol/L — ABNORMAL LOW (ref 1.15–1.40)
Chloride: 103 mmol/L (ref 98–111)
Creatinine, Ser: 1 mg/dL (ref 0.44–1.00)
Glucose, Bld: 130 mg/dL — ABNORMAL HIGH (ref 70–99)
HCT: 44 % (ref 36.0–46.0)
Hemoglobin: 15 g/dL (ref 12.0–15.0)
Potassium: 4 mmol/L (ref 3.5–5.1)
Sodium: 140 mmol/L (ref 135–145)
TCO2: 29 mmol/L (ref 22–32)

## 2023-09-16 LAB — CBC WITH DIFFERENTIAL/PLATELET
Abs Immature Granulocytes: 0.17 10*3/uL — ABNORMAL HIGH (ref 0.00–0.07)
Basophils Absolute: 0 10*3/uL (ref 0.0–0.1)
Basophils Relative: 0 %
Eosinophils Absolute: 0.2 10*3/uL (ref 0.0–0.5)
Eosinophils Relative: 1 %
HCT: 44.1 % (ref 36.0–46.0)
Hemoglobin: 14.2 g/dL (ref 12.0–15.0)
Immature Granulocytes: 1 %
Lymphocytes Relative: 8 %
Lymphs Abs: 1.3 10*3/uL (ref 0.7–4.0)
MCH: 32.1 pg (ref 26.0–34.0)
MCHC: 32.2 g/dL (ref 30.0–36.0)
MCV: 99.5 fL (ref 80.0–100.0)
Monocytes Absolute: 1.4 10*3/uL — ABNORMAL HIGH (ref 0.1–1.0)
Monocytes Relative: 9 %
Neutro Abs: 13.6 10*3/uL — ABNORMAL HIGH (ref 1.7–7.7)
Neutrophils Relative %: 81 %
Platelets: 381 10*3/uL (ref 150–400)
RBC: 4.43 MIL/uL (ref 3.87–5.11)
RDW: 15.9 % — ABNORMAL HIGH (ref 11.5–15.5)
WBC: 16.7 10*3/uL — ABNORMAL HIGH (ref 4.0–10.5)
nRBC: 0 % (ref 0.0–0.2)

## 2023-09-16 LAB — CBG MONITORING, ED
Glucose-Capillary: 146 mg/dL — ABNORMAL HIGH (ref 70–99)
Glucose-Capillary: 112 mg/dL — ABNORMAL HIGH (ref 70–99)

## 2023-09-16 MED ORDER — POLYETHYLENE GLYCOL 3350 17 G PO PACK
17.0000 g | PACK | Freq: Every day | ORAL | Status: DC | PRN
  Administered 2023-09-19: 17 g via ORAL
  Filled 2023-09-16: qty 1

## 2023-09-16 MED ORDER — METHOCARBAMOL 1000 MG/10ML IJ SOLN
500.0000 mg | Freq: Three times a day (TID) | INTRAMUSCULAR | Status: DC
  Filled 2023-09-16: qty 10

## 2023-09-16 MED ORDER — OXYCODONE HCL 5 MG PO TABS
10.0000 mg | ORAL_TABLET | ORAL | Status: DC | PRN
  Administered 2023-09-16 – 2023-09-17 (×3): 10 mg via ORAL
  Filled 2023-09-16 (×3): qty 2

## 2023-09-16 MED ORDER — BUSPIRONE HCL 5 MG PO TABS
30.0000 mg | ORAL_TABLET | Freq: Every morning | ORAL | Status: DC
Start: 2023-09-17 — End: 2023-09-17

## 2023-09-16 MED ORDER — NICOTINE 21 MG/24HR TD PT24
21.0000 mg | MEDICATED_PATCH | Freq: Every day | TRANSDERMAL | Status: DC
  Administered 2023-09-16 – 2023-09-25 (×10): 21 mg via TRANSDERMAL
  Filled 2023-09-16 (×10): qty 1

## 2023-09-16 MED ORDER — ALENDRONATE SODIUM 35 MG PO TABS
35.0000 mg | ORAL_TABLET | ORAL | Status: DC
Start: 2023-09-16 — End: 2023-09-16

## 2023-09-16 MED ORDER — FENTANYL CITRATE PF 50 MCG/ML IJ SOSY
50.0000 ug | PREFILLED_SYRINGE | Freq: Once | INTRAMUSCULAR | Status: AC
  Administered 2023-09-16: 50 ug via INTRAVENOUS
  Filled 2023-09-16: qty 1

## 2023-09-16 MED ORDER — METOPROLOL TARTRATE 5 MG/5ML IV SOLN: 5.0000 mg | Freq: Four times a day (QID) | INTRAVENOUS | Status: DC | PRN

## 2023-09-16 MED ORDER — OXYCODONE HCL 5 MG PO TABS: 5.0000 mg | ORAL_TABLET | ORAL | Status: DC | PRN

## 2023-09-16 MED ORDER — EZETIMIBE 10 MG PO TABS
10.0000 mg | ORAL_TABLET | Freq: Every day | ORAL | Status: DC
  Administered 2023-09-16 – 2023-09-24 (×9): 10 mg via ORAL
  Filled 2023-09-16 (×9): qty 1

## 2023-09-16 MED ORDER — FENTANYL CITRATE PF 50 MCG/ML IJ SOSY
PREFILLED_SYRINGE | INTRAMUSCULAR | Status: AC
  Filled 2023-09-16: qty 1

## 2023-09-16 MED ORDER — NEBIVOLOL HCL 10 MG PO TABS
10.0000 mg | ORAL_TABLET | Freq: Every day | ORAL | Status: DC
  Administered 2023-09-16 – 2023-09-24 (×9): 10 mg via ORAL
  Filled 2023-09-16 (×10): qty 1

## 2023-09-16 MED ORDER — ATORVASTATIN CALCIUM 80 MG PO TABS
80.0000 mg | ORAL_TABLET | Freq: Every day | ORAL | Status: DC
Start: 2023-09-17 — End: 2023-09-25
  Administered 2023-09-17 – 2023-09-25 (×9): 80 mg via ORAL
  Filled 2023-09-16 (×9): qty 1

## 2023-09-16 MED ORDER — ENOXAPARIN SODIUM 30 MG/0.3ML IJ SOSY
30.0000 mg | PREFILLED_SYRINGE | Freq: Two times a day (BID) | INTRAMUSCULAR | Status: DC
  Administered 2023-09-17 – 2023-09-25 (×17): 30 mg via SUBCUTANEOUS
  Filled 2023-09-16 (×17): qty 0.3

## 2023-09-16 MED ORDER — PREDNISONE 5 MG PO TABS
5.0000 mg | ORAL_TABLET | Freq: Every day | ORAL | Status: DC
  Administered 2023-09-16 – 2023-09-17 (×2): 5 mg via ORAL
  Filled 2023-09-16 (×2): qty 1

## 2023-09-16 MED ORDER — IOHEXOL 350 MG/ML SOLN
75.0000 mL | Freq: Once | INTRAVENOUS | Status: AC | PRN
  Administered 2023-09-16: 75 mL via INTRAVENOUS

## 2023-09-16 MED ORDER — FUROSEMIDE 20 MG PO TABS
20.0000 mg | ORAL_TABLET | Freq: Every day | ORAL | Status: DC
  Administered 2023-09-16 – 2023-09-25 (×10): 20 mg via ORAL
  Filled 2023-09-16 (×10): qty 1

## 2023-09-16 MED ORDER — TICAGRELOR 60 MG PO TABS
60.0000 mg | ORAL_TABLET | Freq: Two times a day (BID) | ORAL | Status: DC
  Administered 2023-09-16 – 2023-09-25 (×18): 60 mg via ORAL
  Filled 2023-09-16 (×20): qty 1

## 2023-09-16 MED ORDER — ALENDRONATE SODIUM 35 MG PO TABS: 35.0000 mg | ORAL_TABLET | ORAL | 1 refills | Status: DC

## 2023-09-16 MED ORDER — HYDRALAZINE HCL 20 MG/ML IJ SOLN: 10.0000 mg | INTRAMUSCULAR | Status: DC | PRN

## 2023-09-16 MED ORDER — BUSPIRONE HCL 5 MG PO TABS
15.0000 mg | ORAL_TABLET | Freq: Every day | ORAL | Status: DC
  Administered 2023-09-16: 15 mg via ORAL
  Filled 2023-09-16: qty 2

## 2023-09-16 MED ORDER — MONTELUKAST SODIUM 10 MG PO TABS
10.0000 mg | ORAL_TABLET | Freq: Every day | ORAL | Status: DC
  Administered 2023-09-16 – 2023-09-24 (×9): 10 mg via ORAL
  Filled 2023-09-16 (×9): qty 1

## 2023-09-16 MED ORDER — ATORVASTATIN CALCIUM 80 MG PO TABS
80.0000 mg | ORAL_TABLET | Freq: Every day | ORAL | Status: DC
Start: 2023-09-16 — End: 2023-09-16
  Filled 2023-09-16: qty 1

## 2023-09-16 MED ORDER — LOSARTAN POTASSIUM 50 MG PO TABS
75.0000 mg | ORAL_TABLET | Freq: Every day | ORAL | Status: DC
  Administered 2023-09-16 – 2023-09-25 (×10): 75 mg via ORAL
  Filled 2023-09-16 (×4): qty 1
  Filled 2023-09-16: qty 2
  Filled 2023-09-16 (×5): qty 1

## 2023-09-16 MED ORDER — FLUTICASONE FUROATE-VILANTEROL 200-25 MCG/ACT IN AEPB
1.0000 | INHALATION_SPRAY | Freq: Every day | RESPIRATORY_TRACT | Status: DC
  Administered 2023-09-17: 1 via RESPIRATORY_TRACT
  Filled 2023-09-16: qty 28

## 2023-09-16 MED ORDER — CLONAZEPAM 0.5 MG PO TBDP
0.5000 mg | ORAL_TABLET | Freq: Two times a day (BID) | ORAL | Status: DC
  Administered 2023-09-16 – 2023-09-25 (×19): 0.5 mg via ORAL
  Filled 2023-09-16 (×15): qty 1
  Filled 2023-09-16: qty 4
  Filled 2023-09-16 (×3): qty 1
  Filled 2023-09-16: qty 4
  Filled 2023-09-16: qty 1

## 2023-09-16 MED ORDER — ASPIRIN 81 MG PO TBEC: 81.0000 mg | DELAYED_RELEASE_TABLET | Freq: Every morning | ORAL | Status: DC

## 2023-09-16 MED ORDER — FENTANYL CITRATE PF 50 MCG/ML IJ SOSY
50.0000 ug | PREFILLED_SYRINGE | Freq: Once | INTRAMUSCULAR | Status: AC
  Administered 2023-09-16: 50 ug via INTRAVENOUS

## 2023-09-16 MED ORDER — PROCHLORPERAZINE EDISYLATE 10 MG/2ML IJ SOLN
5.0000 mg | Freq: Four times a day (QID) | INTRAMUSCULAR | Status: DC | PRN
  Administered 2023-09-20: 5 mg via INTRAVENOUS
  Filled 2023-09-16: qty 2

## 2023-09-16 MED ORDER — DOCUSATE SODIUM 100 MG PO CAPS
100.0000 mg | ORAL_CAPSULE | Freq: Two times a day (BID) | ORAL | Status: DC
  Administered 2023-09-16 – 2023-09-24 (×16): 100 mg via ORAL
  Filled 2023-09-16 (×19): qty 1

## 2023-09-16 MED ORDER — LEVOTHYROXINE SODIUM 112 MCG PO TABS
112.0000 ug | ORAL_TABLET | Freq: Every day | ORAL | Status: DC
  Administered 2023-09-16 – 2023-09-25 (×9): 112 ug via ORAL
  Filled 2023-09-16 (×9): qty 1

## 2023-09-16 MED ORDER — ACETAMINOPHEN 500 MG PO TABS
1000.0000 mg | ORAL_TABLET | Freq: Four times a day (QID) | ORAL | Status: DC
  Administered 2023-09-16 – 2023-09-25 (×31): 1000 mg via ORAL
  Filled 2023-09-16 (×31): qty 2

## 2023-09-16 MED ORDER — IPRATROPIUM-ALBUTEROL 0.5-2.5 (3) MG/3ML IN SOLN
3.0000 mL | Freq: Once | RESPIRATORY_TRACT | Status: AC
  Administered 2023-09-16: 3 mL via RESPIRATORY_TRACT
  Filled 2023-09-16: qty 3

## 2023-09-16 MED ORDER — UMECLIDINIUM BROMIDE 62.5 MCG/ACT IN AEPB
1.0000 | INHALATION_SPRAY | Freq: Every day | RESPIRATORY_TRACT | Status: DC
  Administered 2023-09-17: 1 via RESPIRATORY_TRACT
  Filled 2023-09-16: qty 7

## 2023-09-16 MED ORDER — INSULIN ASPART 100 UNIT/ML IJ SOLN
0.0000 [IU] | Freq: Three times a day (TID) | INTRAMUSCULAR | Status: DC
  Administered 2023-09-16 – 2023-09-17 (×2): 2 [IU] via SUBCUTANEOUS
  Administered 2023-09-18: 3 [IU] via SUBCUTANEOUS
  Administered 2023-09-19: 2 [IU] via SUBCUTANEOUS
  Administered 2023-09-19: 8 [IU] via SUBCUTANEOUS
  Administered 2023-09-20: 3 [IU] via SUBCUTANEOUS
  Administered 2023-09-21: 8 [IU] via SUBCUTANEOUS
  Administered 2023-09-22 (×2): 2 [IU] via SUBCUTANEOUS
  Administered 2023-09-22 – 2023-09-23 (×2): 8 [IU] via SUBCUTANEOUS
  Administered 2023-09-24 (×2): 3 [IU] via SUBCUTANEOUS

## 2023-09-16 MED ORDER — LACTATED RINGERS IV SOLN: INTRAVENOUS | Status: AC

## 2023-09-16 MED ORDER — AMLODIPINE BESYLATE 5 MG PO TABS
5.0000 mg | ORAL_TABLET | Freq: Every day | ORAL | Status: DC
  Administered 2023-09-16 – 2023-09-25 (×10): 5 mg via ORAL
  Filled 2023-09-16 (×10): qty 1

## 2023-09-16 MED ORDER — ESCITALOPRAM OXALATE 20 MG PO TABS
20.0000 mg | ORAL_TABLET | Freq: Every day | ORAL | Status: DC
  Administered 2023-09-16 – 2023-09-25 (×10): 20 mg via ORAL
  Filled 2023-09-16 (×8): qty 1
  Filled 2023-09-16: qty 2
  Filled 2023-09-16: qty 1

## 2023-09-16 MED ORDER — INSULIN ASPART 100 UNIT/ML IJ SOLN
0.0000 [IU] | Freq: Every day | INTRAMUSCULAR | Status: DC
  Administered 2023-09-20: 4 [IU] via SUBCUTANEOUS

## 2023-09-16 MED ORDER — HYDROMORPHONE HCL 1 MG/ML IJ SOLN
0.5000 mg | INTRAMUSCULAR | Status: DC | PRN
  Administered 2023-09-16 – 2023-09-23 (×9): 0.5 mg via INTRAVENOUS
  Filled 2023-09-16 (×3): qty 0.5
  Filled 2023-09-16: qty 1
  Filled 2023-09-16 (×5): qty 0.5

## 2023-09-16 MED ORDER — BUSPIRONE HCL 5 MG PO TABS
15.0000 mg | ORAL_TABLET | Freq: Every evening | ORAL | Status: DC
  Administered 2023-09-16: 15 mg via ORAL
  Filled 2023-09-16: qty 2

## 2023-09-16 MED ORDER — ALBUTEROL SULFATE HFA 108 (90 BASE) MCG/ACT IN AERS: 2.0000 | INHALATION_SPRAY | Freq: Four times a day (QID) | RESPIRATORY_TRACT | Status: DC | PRN

## 2023-09-16 MED ORDER — METHOCARBAMOL 500 MG PO TABS
500.0000 mg | ORAL_TABLET | Freq: Three times a day (TID) | ORAL | Status: DC
  Administered 2023-09-16 – 2023-09-17 (×2): 500 mg via ORAL
  Filled 2023-09-16 (×2): qty 1

## 2023-09-16 MED ORDER — ALBUTEROL SULFATE (2.5 MG/3ML) 0.083% IN NEBU
2.5000 mg | INHALATION_SOLUTION | Freq: Four times a day (QID) | RESPIRATORY_TRACT | Status: DC | PRN
  Administered 2023-09-16 – 2023-09-18 (×3): 2.5 mg via RESPIRATORY_TRACT
  Filled 2023-09-16 (×3): qty 3

## 2023-09-16 MED ORDER — GABAPENTIN 300 MG PO CAPS
600.0000 mg | ORAL_CAPSULE | Freq: Two times a day (BID) | ORAL | Status: DC
  Administered 2023-09-16 – 2023-09-25 (×18): 600 mg via ORAL
  Filled 2023-09-16 (×18): qty 2

## 2023-09-16 MED ORDER — MIRTAZAPINE 15 MG PO TABS
45.0000 mg | ORAL_TABLET | Freq: Every day | ORAL | Status: DC
  Administered 2023-09-16 – 2023-09-24 (×9): 45 mg via ORAL
  Filled 2023-09-16 (×9): qty 3

## 2023-09-16 MED ORDER — DOXEPIN HCL 25 MG PO CAPS
75.0000 mg | ORAL_CAPSULE | Freq: Every day | ORAL | Status: DC
  Administered 2023-09-17 – 2023-09-24 (×9): 75 mg via ORAL
  Filled 2023-09-16 (×6): qty 3
  Filled 2023-09-16 (×2): qty 1
  Filled 2023-09-16 (×4): qty 3

## 2023-09-16 NOTE — Telephone Encounter (Signed)
 Copied from CRM [id number]. Topic: General - Other >> Sep 12, 2023 10:01 AM Louie Casa B wrote: Reason for CRM: [facility] is calling saying that patient has swelling in the both legs right leg is worse started 3 days ago please call [phone]

## 2023-09-16 NOTE — ED Notes (Signed)
 Pt provided with mouth swabs at this time.

## 2023-09-16 NOTE — Procedures (Signed)
 Surgeon: Violeta Gelinas, MD Assist: Barnetta Chapel, PA-C  Chest tube insertion  Date/Time: 09/16/2023 1:18 PM  Performed by: Violeta Gelinas, MD Authorized by: Violeta Gelinas, MD   Consent:    Consent obtained:  Written   Consent given by:  Patient Pre-procedure details:    Skin preparation:  ChloraPrep   Preparation: Patient was prepped and draped in the usual sterile fashion   Anesthesia (see MAR for exact dosages):    Anesthesia method:  Local infiltration Procedure details:    Placement location:  R lateral   Tube size (Fr):  Minicatheter   Technique: Seldinger     Ultrasound guidance: no     Tension pneumothorax: no     Tube connected to:  Suction   Dressing:  4x4 sterile gauze Post-procedure details:    Patient tolerance of procedure:  Tolerated well, no immediate complications Air and blood return CXR pending  Violeta Gelinas, MD, MPH, FACS Please use [email or web address] to contact on call provider

## 2023-09-16 NOTE — ED Notes (Signed)
 Trauma Event Note  Assisted Earl Gala PA and [place] TMD with chest tube insertion. Vitals stable throughout procedure. Patient premedicated with 50 mcg fentanyl. R pigtail chest tube placed without complication, placed on -20 suction. CXR completed post procedure. Handoff with ED RN.  Last imported Vital Signs BP 137/67   Pulse (!) 59   Temp 98.7 F (37.1 C) (Oral)   Resp (!) 21   Ht 4\' 9"  (1.448 m)   Wt 154 lb (69.9 kg)   SpO2 100%   BMI 33.33 kg/m   Trending CBC Recent Labs    09/16/23 0900 09/16/23 0917  WBC 16.7*  --   HGB 14.2 15.0  HCT 44.1 44.0  PLT 381  --    Trending BMET Recent Labs    09/16/23 0900 09/16/23 0917  NA 140 140  K 4.1 4.0  CL 106 103  CO2 26  --   BUN 8 9  CREATININE 0.95 1.00  GLUCOSE 130* 130*   [name] [name]  Trauma Response RN  Please call TRN at [phone] for further assistance.

## 2023-09-16 NOTE — TOC CAGE-AID Note (Signed)
 Transition of Care [facility]) - CAGE-AID Screening  Patient Details  Name: [name] [name] MRN: [id number] Date of Birth: 1955/07/24  Clinical Narrative:  Patient endorses cigarette smoking, ppd on average. Patient ordered a nicotine patch and counseled on cigarette smoking. Patient endorses occasional alcohol use, not in excess. Patient does use marijuana for anxiety. Resources for cessation provided to patient.  CAGE-AID Screening:   Have You Ever Felt You Ought to Cut Down on Your Drinking or Drug Use?: No Have People Annoyed You By Critizing Your Drinking Or Drug Use?: No Have You Felt Bad Or Guilty About Your Drinking Or Drug Use?: No Have You Ever Had a Drink or Used Drugs First Thing In The Morning to Steady Your Nerves or to Get Rid of a Hangover?: No CAGE-AID Score: 0  Substance Abuse Education Offered: Yes

## 2023-09-16 NOTE — H&P (Signed)
 [name] [name] 21-Mar-1956  [id number].     Chief Complaint/Reason for Consult: Fall out of bed, rib fractures  HPI:  This is a pleasant 68 yo female with a history of chronic respiratory failure, on 2L O2 [place] at home, COPD with recent exacerbation, HTN, HLD, CAD/MI with stents on plavix and ASA, DM, depression/anxiety, and hypothyroidism who was in bed last night and rolled out accidentally and hit her head on the side table and fell on the right side of her body.  She had pain in her chest immediately.  She has no other complaints.  In the ED she has been found to have right-sided rib fractures and a R PTX.  She has no other acute traumatic findings.  We have been asked to see her for admission.  ROS: ROS: see HPI  Family History  Problem Relation Age of Onset   Heart disease Mother    Hypertension Mother    Stroke Mother    Mental illness Mother    Heart disease Father    Hypertension Father    Diabetes Father    Colon polyps Brother    Heart disease Maternal Grandfather    Cancer Paternal Grandmother        mouth   Anesthesia problems Daughter    Breast cancer Maternal Aunt    Throat cancer Maternal Uncle    Thyroid disease Paternal Aunt    Hypotension Neg Hx    Malignant hyperthermia Neg Hx    Pseudochol deficiency Neg Hx    Colon cancer Neg Hx    Stomach cancer Neg Hx    Esophageal cancer Neg Hx    Pancreatic cancer Neg Hx    Rectal cancer Neg Hx     Past Medical History:  Diagnosis Date   Allergy    seasonal   Anemia    Anxiety    takes Lexapro daily   Arthritis    "back, from neck down pass my bra area" (03/25/2017)   Asthma    Bartholin gland cyst 08/29/2011   Bruises easily    pt is on Effient   Chronic back pain    herniated nucleus pulposus   Chronic back pain    "neck to bra area; lower back" (03/25/2017)   Chronic kidney disease    recurrent UTI's this year 2022   COPD (chronic obstructive pulmonary disease) (HCC)    early stages   Coronary  artery disease    Depression    takes Klonopin daily   Diabetes mellitus without complication (HCC)    Diverticulosis    Fibroadenoma of left breast    GERD (gastroesophageal reflux disease)    takes Nexium daily   H/O hiatal hernia    Heart attack (HCC) 2011   Hemorrhoids    Hernia    Hyperlipidemia    takes Lipitor daily   Hypertension    takes Losartan daily and Labetalol bid   Hypothyroidism    takes Synthroid daily   Insomnia    hydroxyzine prn   Joint pain    Pneumonia    "couple times" (03/25/2017)   Pre-diabetes    "just found out 1 wk ago" (03/25/2017)   Psoriasis    elbows,knees,back   Shortness of breath    with exertion   Slowing of urinary stream    Stress incontinence     Past Surgical History:  Procedure Laterality Date   ABDOMINAL EXPLORATION SURGERY  1977   ABDOMINAL HYSTERECTOMY  1977   "  left one of my ovaries"   BACK SURGERY     BIOPSY  05/27/2019   Procedure: BIOPSY;  Surgeon: Corbin Ade, MD;  Location: [facility];  Service: Endoscopy;;   BREAST BIOPSY Left 11/2021   x2   COLONOSCOPY  2011   Dr. Christella Hartigan: Mild diverticulosis, descending diminutive colon polyp (not retrieved), next colonoscopy 10 years   CORONARY ANGIOPLASTY WITH STENT PLACEMENT  2011 X2   "regular stents didn't work; had to go back in in ~ 1 month and put in medicated stents"   DILATION AND CURETTAGE OF UTERUS     ESOPHAGOGASTRODUODENOSCOPY     ESOPHAGOGASTRODUODENOSCOPY (EGD) WITH PROPOFOL N/A 05/27/2019   normal esophagus, dilation, erosive gastropathy s/p biopsy, normal duodenum. Negative H.pylori.    LEFT HEART CATH AND CORONARY ANGIOGRAPHY N/A 03/26/2017   Procedure: LEFT HEART CATH AND CORONARY ANGIOGRAPHY;  Surgeon: Lyn Records, MD;  Location: [facility];  Service: Cardiovascular;  Laterality: N/A;   LUMBAR LAMINECTOMY/DECOMPRESSION MICRODISCECTOMY  08/05/2011   Procedure: LUMBAR LAMINECTOMY/DECOMPRESSION MICRODISCECTOMY;  Surgeon: Clydene Fake, MD;   Location: [facility];  Service: Neurosurgery;  Laterality: Right;  Right Lumbar four-five extraforaminal discectomy   MALONEY DILATION N/A 05/27/2019   Procedure: Elease Hashimoto DILATION;  Surgeon: Corbin Ade, MD;  Location: [facility];  Service: Endoscopy;  Laterality: N/A;  54   SHOULDER ARTHROSCOPY WITH ROTATOR CUFF REPAIR AND OPEN BICEPS TENODESIS Right 12/24/2021   Procedure: RIGHT SHOULDER ARTHROSCOPY WITH ROTATOR CUFF REPAIR AND BICEPS TENODESIS;  Surgeon: Eldred Manges, MD;  Location: [facility];  Service: Orthopedics;  Laterality: Right;   TONSILLECTOMY     as a child   UPPER GASTROINTESTINAL ENDOSCOPY      Social History:  reports that she has been smoking cigarettes. She has a 25 pack-year smoking history. She has never used smokeless tobacco. She reports current alcohol use. She reports current drug use. Drug: Marijuana.  Allergies:  Allergies  Allergen Reactions   Avocado Anaphylaxis   Latex Shortness Of Breath and Rash   Codeine Nausea Only    (Not in a hospital admission)    Physical Exam: Blood pressure 137/67, pulse (!) 59, temperature 98.7 F (37.1 C), temperature source Oral, resp. rate (!) 21, height 4\' 9"  (1.448 m), weight 69.9 kg, SpO2 100%. General: pleasant, WD, WN white female who is laying in bed in NAD HEENT: head is normocephalic, atraumatic.  Sclera are noninjected.  PERRL.  Ears and nose without any masses or lesions.  Mouth is pink and dry.  [place] with 2L O2 running, sats between 95-100% Heart: regular, rate, and rhythm.  Normal s1,s2. No obvious murmurs, gallops, or rubs noted.  Palpable radial and pedal pulses bilaterally Lungs: diffuse wheezing, some decrease BS on right, but still audible.  Respiratory effort nonlabored on 2L.  See procedure note.  R pigtail chest tube placed with no issues. Abd: soft, NT, ND, +BS, no masses, hernias, or organomegaly, ecchymosis on abdominal wall from lovenox during last admit MS: all 4 extremities are symmetrical with no  cyanosis, clubbing, or edema.  Normal ROM of all extremities except RUE secondary to pain from her ribs. Skin: warm and dry with no masses, lesions, or rashes Neuro: Cranial nerves 2-12 grossly intact, sensation is normal throughout Psych: A&Ox3 with an appropriate affect.   Results for orders placed or performed during the hospital encounter of 09/16/23 (from the past 48 hours)  CBC with Differential     Status: Abnormal  Collection Time: 09/16/23  9:00 AM  Result Value Ref Range   WBC 16.7 (H) 4.0 - 10.5 K/uL   RBC 4.43 3.87 - 5.11 MIL/uL   Hemoglobin 14.2 12.0 - 15.0 g/dL   HCT 16.1 09.6 - 04.5 %   MCV 99.5 80.0 - 100.0 fL   MCH 32.1 26.0 - 34.0 pg   MCHC 32.2 30.0 - 36.0 g/dL   RDW 40.9 (H) 81.1 - 91.4 %   Platelets 381 150 - 400 K/uL   nRBC 0.0 0.0 - 0.2 %   Neutrophils Relative % 81 %   Neutro Abs 13.6 (H) 1.7 - 7.7 K/uL   Lymphocytes Relative 8 %   Lymphs Abs 1.3 0.7 - 4.0 K/uL   Monocytes Relative 9 %   Monocytes Absolute 1.4 (H) 0.1 - 1.0 K/uL   Eosinophils Relative 1 %   Eosinophils Absolute 0.2 0.0 - 0.5 K/uL   Basophils Relative 0 %   Basophils Absolute 0.0 0.0 - 0.1 K/uL   Immature Granulocytes 1 %   Abs Immature Granulocytes 0.17 (H) 0.00 - 0.07 K/uL    Comment: Performed at [facility], [address], [place], [place] [address]  Basic metabolic panel     Status: Abnormal   Collection Time: 09/16/23  9:00 AM  Result Value Ref Range   Sodium 140 135 - 145 mmol/L   Potassium 4.1 3.5 - 5.1 mmol/L   Chloride 106 98 - 111 mmol/L   CO2 26 22 - 32 mmol/L   Glucose, Bld 130 (H) 70 - 99 mg/dL    Comment: Glucose reference range applies only to samples taken after fasting for at least 8 hours.   BUN 8 8 - 23 mg/dL   Creatinine, Ser 6.21 0.44 - 1.00 mg/dL   Calcium 9.2 8.9 - 30.8 mg/dL   GFR, Estimated >65 >78 mL/min    Comment: (NOTE) Calculated using the CKD-EPI Creatinine Equation (2021)    Anion gap 8 5 - 15    Comment: Performed at [facility], [address], [place], [place] [address]  I-stat chem 8, ED (not at [facility], [facility] or [facility])     Status: Abnormal   Collection Time: 09/16/23  9:17 AM  Result Value Ref Range   Sodium 140 135 - 145 mmol/L   Potassium 4.0 3.5 - 5.1 mmol/L   Chloride 103 98 - 111 mmol/L   BUN 9 8 - 23 mg/dL   Creatinine, Ser 9.52 0.44 - 1.00 mg/dL   Glucose, Bld 841 (H) 70 - 99 mg/dL    Comment: Glucose reference range applies only to samples taken after fasting for at least 8 hours.   Calcium, Ion 1.13 (L) 1.15 - 1.40 mmol/L   TCO2 29 22 - 32 mmol/L   Hemoglobin 15.0 12.0 - 15.0 g/dL   HCT 32.4 40.1 - 02.7 %   *Note: Due to a large number of results and/or encounters for the requested time period, some results have not been displayed. A complete set of results can be found in Results Review.   CT Head Wo Contrast Result Date: 09/16/2023 CLINICAL DATA:  Trauma, ground level fall onto right side last night, chest and arm pain. EXAM: CT HEAD WITHOUT CONTRAST CT CERVICAL SPINE WITHOUT CONTRAST TECHNIQUE: Multidetector CT imaging of the head and cervical spine was performed following the standard protocol without intravenous contrast. Multiplanar CT image reconstructions of the cervical spine were also generated. RADIATION DOSE REDUCTION: This exam was performed  according to the departmental dose-optimization program which includes automated exposure control, adjustment of the mA and/or kV according to patient size and/or use of iterative reconstruction technique. COMPARISON:  MRI cervical spine 03/16/2021, CT head 04/16/2012 FINDINGS: CT HEAD FINDINGS Brain: No acute intracranial hemorrhage. No CT evidence of acute infarct. Nonspecific hypoattenuation in the periventricular and subcortical white matter favored to reflect chronic microvascular ischemic changes. 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 No edema, mass effect, or midline shift. The basilar cisterns are patent. Ventricles: Prominence of the  ventricles suggesting underlying parenchymal volume loss. Vascular: Atherosclerotic calcifications of the carotid siphons. No hyperdense vessel. Skull: No acute or aggressive finding. Orbits: Left lens replacement.  Orbits otherwise symmetric. Sinuses: Mild mucosal thickening in the ethmoid sinuses. Small osteoma in the right ethmoid sinus. Concha bullosa on the right with leftward nasal septal deviation. Other: Mastoid air cells are clear. CT CERVICAL SPINE FINDINGS Alignment: Straightening of the normal cervical lordosis. No listhesis. No facet subluxation or dislocation. Skull base and vertebrae: No acute fracture or compression fracture in the cervical spine. Degenerative endplate changes most pronounced on the right posteriorly at C5-6 and C6-7. No suspicious osseous lesion. Soft tissues and spinal canal: No prevertebral fluid or swelling. No visible canal hematoma. Elongation of the bilateral styloid processes with calcification of the stylohyoid ligaments. Disc levels: Mild disc space narrowing at C5-6 and C6-7. Central disc protrusion at C4-5 resulting in at least mild spinal canal stenosis. Additional disc osteophyte complexes at C5-6 and C6-7 resulting in mild spinal canal stenosis. Facet arthrosis most pronounced on the right at C5-6 and C6-7. Foraminal narrowing greatest on the right at C5-6 and C6-7. Upper chest: Partially visualized right pneumothorax. Gas noted along the right anterolateral chest wall. Emphysema in the lung apices. Other: None. IMPRESSION: CT HEAD: 1. No acute intracranial abnormality. 2. Moderate chronic microvascular ischemic changes and mild parenchymal volume loss. 3. 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 CT CERVICAL SPINE: 1. No acute fracture or traumatic malalignment of the cervical spine. 2. Partially visualized right pneumothorax. 3. Multilevel degenerative changes of the cervical spine, as described above. 4. Elongation of the bilateral styloid processes with  calcification of the stylohyoid ligaments. Correlate for symptoms of Eagle syndrome. Electronically Signed   By: Emily Filbert M.D.   On: 09/16/2023 12:46   CT CHEST ABDOMEN PELVIS W CONTRAST Result Date: 09/16/2023 CLINICAL DATA:  Right-sided chest wall, arm and hip pain after ground level fall. EXAM: CT CHEST, ABDOMEN, AND PELVIS WITH CONTRAST TECHNIQUE: Multidetector CT imaging of the chest, abdomen and pelvis was performed following the standard protocol during bolus administration of intravenous contrast. RADIATION DOSE REDUCTION: This exam was performed according to the departmental dose-optimization program which includes automated exposure control, adjustment of the mA and/or kV according to patient size and/or use of iterative reconstruction technique. CONTRAST:  75mL OMNIPAQUE IOHEXOL 350 MG/ML SOLN COMPARISON:  X-ray earlier 09/16/2023 of the chest. Screening chest CT 02/07/2023. Abdomen and pelvis CT without contrast 12/16/2020. FINDINGS: CT CHEST FINDINGS Cardiovascular: Heart is nonenlarged. Trace pericardial fluid. Coronary artery calcifications are seen. The thoracic aorta is normal course and caliber with mild atherosclerotic calcified plaque. Plaque also seen along the origin of the great vessels. Mediastinum/Nodes: Normal caliber thoracic esophagus. Atrophic thyroid gland. No specific abnormal lymph node enlargement identified in the axillary regions, hilum or mediastinum. Lungs/Pleura: Small right-sided pneumothorax particularly anterior and towards the right lung apex. Some medial areas as well. No left-sided pneumothorax. Bandlike opacity along the lung bases,  right-greater-than-left. Atelectasis is favored. Is also some presumed mucous plugging along the right lower lobe. Trace right-sided pleural fluid with possible component hematoma. Underlying centrilobular emphysematous lung changes. There also several scattered noncalcified small lung nodules identified. These include 3 mm left lung  series 5, image 53 posteriorly. There are several previously seen which are similar today. There is also a new focus such as posterior left upper lobe series 5, image 46 measuring 4 mm. Recommend continued surveillance in 3 months. Musculoskeletal: Scattered moderate degenerative changes along the spine. Right-sided chest wall gas identified. Numerous right-sided rib fractures identified. This includes the fourth rib, fifth, sixth, seventh, eighth, ninth ribs. CT ABDOMEN PELVIS FINDINGS Hepatobiliary: No focal liver abnormality is seen. No gallstones, gallbladder wall thickening, or biliary dilatation. Patent portal vein Pancreas: Scattered atrophy of the pancreas. Spleen: Normal in size without focal abnormality. Adrenals/Urinary Tract: The right adrenal gland is minimally thickened. There is a left adrenal nodule specifically seen which previously had Hounsfield unit of less than 0 consistent with an adenoma. No enhancing renal mass or collecting system dilatation. The ureters have normal course and caliber extending down to the urinary bladder. Preserved contour to the urinary bladder. Stomach/Bowel: The stomach and small bowel are nondilated. Large bowel has a normal course and caliber with scattered colonic stool. Sigmoid colon diverticula. There is some minimal stranding seen adjacent to the sigmoid colon laterally such as axial series 3, image 96. Please correlate with any symptoms. Vascular/Lymphatic: Aortic atherosclerosis. No enlarged abdominal or pelvic lymph nodes. Reproductive: Status post hysterectomy. No adnexal masses. Other: No free intra-air or free fluid. Gas seen tracking along the right side of the anterior abdominal wall. Musculoskeletal: Scattered degenerative changes of the spine and pelvis. Of the there is streak artifact as the patient's arms were scanned at the patient's side. Critical Value/emergent results were called by telephone at the time of interpretation on 09/16/2023 at 11:15am to  provider AMJAD ALI , who verbally acknowledged these results. IMPRESSION: Numerous right-sided rib fractures identified extending from the four hundred thirty-ninth ribs. Some of these are displaced. There is significant chest wall gas as well as a small to moderate right-sided pneumothorax. Dependent atelectasis. Trace high density fluid along the right pleural space or extrapleural region. Multiple lung nodules identified. There is at least 1 which may be new from the prior examination but still under 5 mm. Recommend follow up in 3 months. No bowel obstruction, free air or free fluid. No evidence of solid organ injury. Sigmoid colon diverticula. There is slight stranding in this location. Please correlate for any clinical symptoms of a diverticulitis. Stable left adrenal nodules consistent with adenomas. Electronically Signed   By: Karen Kays M.D.   On: 09/16/2023 11:23   DG Chest Portable 1 View Result Date: 09/16/2023 CLINICAL DATA:  Pain after fall EXAM: PORTABLE CHEST 1 VIEW COMPARISON:  X-ray 08/31/2023 FINDINGS: Right lateral chest wall gas identified with a small right apical pneumothorax. Bandlike opacity seen along the right lung base greater than left. Atelectasis favored over infiltrate but recommend follow-up. No edema. No effusion. Normal cardiopericardial silhouette. Calcified aorta. Overlapping cardiac leads. Old left clavicle fracture. IMPRESSION: Small right apical pneumothorax.  Mild lung base opacity. Chest wall gas. Please see separate CT scan of the chest, abdomen and pelvis Electronically Signed   By: Karen Kays M.D.   On: 09/16/2023 11:09      Assessment/Plan Fall out of bed R rib fractures 4-9 - pulm toilet, PCXR in am, IS, pain  control, mobilization R PTX - pigtail placed in trauma bay.  Follow up CXR confirms placement.  -20 suction.  CXR in am Chronic respiratory failure on home O2/COPD - continue [place] O2 at 2L.  Sating well right now on 2L CAD/MI - continue ASA/Plavix DM  - metformin, SSI HTN - resume home meds HLD Depression/Anxiety Hypothyroidism - resume home meds FEN - carb mod diet VTE - ASA/Plavix ID - none needed Admit - inpatient, progressive bed, therapies, pulmonary toilet  I reviewed nursing notes, ED provider notes, last 24 h vitals and pain scores, last 48 h intake and output, last 24 h labs and trends, and last 24 h imaging results.  Letha Cape, [facility] 09/16/2023, 1:33 PM Please see Amion for pager number during day hours 7:00am-4:30pm or 7:00am -11:30am on weekends

## 2023-09-16 NOTE — ED Triage Notes (Signed)
 Pt BIB EMS from home with c/o R sided chest wall pain, R arm pain, and R hip pain following a ground level fall last night. Pt states she fell out of bed onto her R side. Was able to get herself up and back into bed afterwards, but did have several episodes of emesis. Pt is on plavix and did hit her head.

## 2023-09-16 NOTE — ED Provider Notes (Signed)
 [place] EMERGENCY DEPARTMENT AT [facility] Provider Note   CSN: [id number] Arrival date & time: 09/16/23  4098     History  Chief Complaint  Patient presents with   [name] [name]    [name] [name] is a 68 y.o. female.  68 year old female presents today for concern of a fall that occurred last night.  She was sitting up to the edge of the bed and slid down falling onto the right side.  She does endorse hitting her head.  She is on aspirin and Plavix.  Her main concern is pain to the right side including her chest and upper abdomen.  No loss of consciousness.  She states she was able to help herself back onto the bed but it was not easy.  She lives alone.  She has history of COPD and has chronic hypoxic respiratory failure and is on chronic oxygen.  The history is provided by the patient. No language interpreter was used.       Home Medications Prior to Admission medications   Medication Sig Start Date End Date Taking? Authorizing Provider  omeprazole (PRILOSEC) 40 MG capsule Take 1 capsule (40 mg total) by mouth 2 (two) times daily before a meal. 09/08/23   Unk Lightning, PA  albuterol (VENTOLIN HFA) 108 (90 Base) MCG/ACT inhaler INHALE 2 PUFFS INTO THE LUNGS EVERY 6 HOURS AS NEEDED FOR WHEEZING OR SHORTNESS OF BREATH 07/18/23   [name], MD  alendronate (FOSAMAX) 35 MG tablet Take 1 tablet (35 mg total) by mouth every 7 (seven) days. Take with a full glass of water on an empty stomach. Sit upright for 30-60 mins after taking 09/16/23   [name], MD  amLODipine (NORVASC) 5 MG tablet Take 1 tablet (5 mg total) by mouth daily. 09/18/22   Joylene Grapes, NP  aspirin EC 81 MG tablet Take 81 mg by mouth in the morning.    [provider]  atorvastatin (LIPITOR) 80 MG tablet Take 1 tablet (80 mg total) by mouth daily, please schedule appointment for more refills 09/18/22   Joylene Grapes, NP  Blood Glucose Monitoring Suppl (ACCU-CHEK GUIDE) w/Device KIT Use  to check blood sugars once a day 09/05/23   [name], MD  busPIRone (BUSPAR) 15 MG tablet Take 2 tablets (30 mg total) by mouth in the morning AND 1 tablet (15 mg total) daily at 12 noon AND 1 tablet (15 mg total) every evening. Patient taking differently: Take 2 tablets (30 mg total) by mouth in the morning AND 1 tablet (15 mg total) daily at 12 noon AND 1 tablet (15 mg total) every evening. (Patient takes 4 tabs every night at bedtime) 06/27/23   Nwoko, Tommas Olp, PA  clonazePAM (KLONOPIN) 0.5 MG disintegrating tablet Take 1 tablet (0.5 mg total) by mouth 2 (two) times daily. 07/23/23   Faith Rogue, DO  doxepin (SINEQUAN) 75 MG capsule Take 1 capsule (75 mg total) by mouth at bedtime. 06/27/23   Nwoko, Tommas Olp, PA  escitalopram (LEXAPRO) 20 MG tablet Take 1 tablet (20 mg total) by mouth daily. 06/27/23   Nwoko, Tommas Olp, PA  ezetimibe (ZETIA) 10 MG tablet Take 1 tablet (10 mg total) by mouth daily. Patient taking differently: Take 10 mg by mouth at bedtime. 07/04/23   Azalee Course, PA  Fluticasone-Umeclidin-Vilant (TRELEGY ELLIPTA) 200-62.5-25 MCG/ACT AEPB Inhale 1 puff into the lungs daily. 08/07/23   Martina Sinner, MD  furosemide (LASIX) 20 MG tablet Take  1 tablet (20 mg total) by mouth daily. 09/18/22   Joylene Grapes, NP  gabapentin (NEURONTIN) 600 MG tablet TAKE 1 TABLET BY MOUTH 4 TIMES DAILY Patient taking differently: Take 600 mg by mouth 2 (two) times daily. 12/20/22   [name], MD  glucose blood (TRUE METRIX BLOOD GLUCOSE TEST) test strip Use as instructed to check blood sugars once a day 09/05/23   [name], MD  ipratropium-albuterol (DUONEB) 0.5-2.5 (3) MG/3ML SOLN Take 3 mLs by nebulization every 6 (six) hours as needed. 07/23/23   Faith Rogue, DO  levothyroxine (SYNTHROID) 112 MCG tablet Take 1 tablet (112 mcg total) by mouth daily. 12/20/22   [name], MD  losartan (COZAAR) 50 MG tablet Take 1.5 tablets (75 mg total) by mouth daily. 09/18/22   Joylene Grapes, NP  metFORMIN (GLUCOPHAGE) 500 MG tablet Take 1 tablet (500 mg total) by mouth 2 (two) times daily with a meal. Patient taking differently: Take 500 mg by mouth in the morning. 04/23/23   [name], MD  mirtazapine (REMERON) 45 MG tablet Take 1 tablet (45 mg total) by mouth at bedtime. 06/27/23   Nwoko, Tommas Olp, PA  montelukast (SINGULAIR) 10 MG tablet Take 1 tablet (10 mg total) by mouth at bedtime. 12/20/22   [name], MD  nebivolol (BYSTOLIC) 10 MG tablet Take 1 tablet (10 mg total) by mouth daily. Patient taking differently: Take 10 mg by mouth at bedtime. 09/18/22   Joylene Grapes, NP  nicotine (NICODERM CQ - DOSED IN MG/24 HOURS) 21 mg/24hr patch Place 1 patch (21 mg total) onto the skin daily. 09/06/23   Jerald Kief, MD  nitroGLYCERIN (NITROSTAT) 0.4 MG SL tablet Place 1 tablet (0.4 mg total) under the tongue every 5 (five) minutes as needed for chest pain. Patient not taking: Reported on 09/01/2023 09/18/22 08/31/24  Joylene Grapes, NP  predniSONE (DELTASONE) 10 MG tablet Take 4 tablets (40 mg total) by mouth daily for 3 days, THEN 2 tablets (20 mg total) daily for 3 days, THEN 1 tablet (10 mg total) daily for 3 days, THEN 0.5 tablets (5 mg total) daily for 2 days. 09/05/23 09/16/23  Jerald Kief, MD  Spacer/Aero-Holding Rudean Curt Use with the albuterol inhaler. 07/22/23   Marrianne Mood, MD  ticagrelor (BRILINTA) 60 MG TABS tablet Take 1 tablet (60 mg total) by mouth 2 (two) times daily. 09/18/22   Joylene Grapes, NP  triamcinolone (KENALOG) 0.025 % ointment Apply 1 Application topically 2 (two) times daily as needed. Patient not taking: Reported on 09/01/2023 01/17/23   [name], MD  TRUEplus Lancets 28G MISC Use to check blood sugars once a day 09/05/23   [name], MD  Zoster Vaccine Adjuvanted [facility]) injection Inject 0.5 mLs into the muscle once for 1 dose. 05/20/23 05/20/23  [name], MD      Allergies    Avocado, Latex, and  Codeine    Review of Systems   Review of Systems  Constitutional:  Negative for chills and fever.  Eyes:  Negative for visual disturbance.  Respiratory:  Positive for shortness of breath.   Cardiovascular:  Positive for chest pain.  Gastrointestinal:  Negative for nausea and vomiting.  Neurological:  Negative for light-headedness.  All other systems reviewed and are negative.   Physical Exam Updated Vital Signs BP 137/67   Pulse (!) 59   Temp 98.7 F (37.1 C) (Oral)   Resp Marland Kitchen)  21   Ht 4\' 9"  (1.448 m)   Wt 69.9 kg   SpO2 100%   BMI 33.33 kg/m  Physical Exam Vitals and nursing note reviewed.  Constitutional:      General: She is not in acute distress.    Appearance: Normal appearance. She is not ill-appearing.  HENT:     Head: Normocephalic and atraumatic.     Nose: Nose normal.  Eyes:     Conjunctiva/sclera: Conjunctivae normal.  Cardiovascular:     Rate and Rhythm: Normal rate and regular rhythm.  Pulmonary:     Effort: Pulmonary effort is normal. No respiratory distress.  Musculoskeletal:        General: No deformity.     Comments: Significant right-sided chest wall pain.  Spine without tenderness to palpation.  Spine well aligned.  Otherwise decent range of motion in bilateral upper and lower extremities.  Skin:    Findings: No rash.  Neurological:     Mental Status: She is alert.     ED Results / Procedures / Treatments   Labs (all labs ordered are listed, but only abnormal results are displayed) Labs Reviewed  CBC WITH DIFFERENTIAL/PLATELET - Abnormal; Notable for the following components:      Result Value   WBC 16.7 (*)    RDW 15.9 (*)    Neutro Abs 13.6 (*)    Monocytes Absolute 1.4 (*)    Abs Immature Granulocytes 0.17 (*)    All other components within normal limits  BASIC METABOLIC PANEL - Abnormal; Notable for the following components:   Glucose, Bld 130 (*)    All other components within normal limits  I-STAT CHEM 8, ED - Abnormal; Notable  for the following components:   Glucose, Bld 130 (*)    Calcium, Ion 1.13 (*)    All other components within normal limits    EKG None  Radiology CT Head Wo Contrast Result Date: 09/16/2023 CLINICAL DATA:  Trauma, ground level fall onto right side last night, chest and arm pain. EXAM: CT HEAD WITHOUT CONTRAST CT CERVICAL SPINE WITHOUT CONTRAST TECHNIQUE: Multidetector CT imaging of the head and cervical spine was performed following the standard protocol without intravenous contrast. Multiplanar CT image reconstructions of the cervical spine were also generated. RADIATION DOSE REDUCTION: This exam was performed according to the departmental dose-optimization program which includes automated exposure control, adjustment of the mA and/or kV according to patient size and/or use of iterative reconstruction technique. COMPARISON:  MRI cervical spine 03/16/2021, CT head 04/16/2012 FINDINGS: CT HEAD FINDINGS Brain: No acute intracranial hemorrhage. No CT evidence of acute infarct. Nonspecific hypoattenuation in the periventricular and subcortical white matter favored to reflect chronic microvascular ischemic changes. 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 No edema, mass effect, or midline shift. The basilar cisterns are patent. Ventricles: Prominence of the ventricles suggesting underlying parenchymal volume loss. Vascular: Atherosclerotic calcifications of the carotid siphons. No hyperdense vessel. Skull: No acute or aggressive finding. Orbits: Left lens replacement.  Orbits otherwise symmetric. Sinuses: Mild mucosal thickening in the ethmoid sinuses. Small osteoma in the right ethmoid sinus. Concha bullosa on the right with leftward nasal septal deviation. Other: Mastoid air cells are clear. CT CERVICAL SPINE FINDINGS Alignment: Straightening of the normal cervical lordosis. No listhesis. No facet subluxation or dislocation. Skull base and vertebrae: No acute fracture or compression fracture in the  cervical spine. Degenerative endplate changes most pronounced on the right posteriorly at C5-6 and C6-7. No suspicious osseous lesion. Soft tissues and spinal  canal: No prevertebral fluid or swelling. No visible canal hematoma. Elongation of the bilateral styloid processes with calcification of the stylohyoid ligaments. Disc levels: Mild disc space narrowing at C5-6 and C6-7. Central disc protrusion at C4-5 resulting in at least mild spinal canal stenosis. Additional disc osteophyte complexes at C5-6 and C6-7 resulting in mild spinal canal stenosis. Facet arthrosis most pronounced on the right at C5-6 and C6-7. Foraminal narrowing greatest on the right at C5-6 and C6-7. Upper chest: Partially visualized right pneumothorax. Gas noted along the right anterolateral chest wall. Emphysema in the lung apices. Other: None. IMPRESSION: CT HEAD: 1. No acute intracranial abnormality. 2. Moderate chronic microvascular ischemic changes and mild parenchymal volume loss. 3. 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 CT CERVICAL SPINE: 1. No acute fracture or traumatic malalignment of the cervical spine. 2. Partially visualized right pneumothorax. 3. Multilevel degenerative changes of the cervical spine, as described above. 4. Elongation of the bilateral styloid processes with calcification of the stylohyoid ligaments. Correlate for symptoms of Eagle syndrome. Electronically Signed   By: Emily Filbert M.D.   On: 09/16/2023 12:46   CT CHEST ABDOMEN PELVIS W CONTRAST Result Date: 09/16/2023 CLINICAL DATA:  Right-sided chest wall, arm and hip pain after ground level fall. EXAM: CT CHEST, ABDOMEN, AND PELVIS WITH CONTRAST TECHNIQUE: Multidetector CT imaging of the chest, abdomen and pelvis was performed following the standard protocol during bolus administration of intravenous contrast. RADIATION DOSE REDUCTION: This exam was performed according to the departmental dose-optimization program which includes automated exposure  control, adjustment of the mA and/or kV according to patient size and/or use of iterative reconstruction technique. CONTRAST:  75mL OMNIPAQUE IOHEXOL 350 MG/ML SOLN COMPARISON:  X-ray earlier 09/16/2023 of the chest. Screening chest CT 02/07/2023. Abdomen and pelvis CT without contrast 12/16/2020. FINDINGS: CT CHEST FINDINGS Cardiovascular: Heart is nonenlarged. Trace pericardial fluid. Coronary artery calcifications are seen. The thoracic aorta is normal course and caliber with mild atherosclerotic calcified plaque. Plaque also seen along the origin of the great vessels. Mediastinum/Nodes: Normal caliber thoracic esophagus. Atrophic thyroid gland. No specific abnormal lymph node enlargement identified in the axillary regions, hilum or mediastinum. Lungs/Pleura: Small right-sided pneumothorax particularly anterior and towards the right lung apex. Some medial areas as well. No left-sided pneumothorax. Bandlike opacity along the lung bases, right-greater-than-left. Atelectasis is favored. Is also some presumed mucous plugging along the right lower lobe. Trace right-sided pleural fluid with possible component hematoma. Underlying centrilobular emphysematous lung changes. There also several scattered noncalcified small lung nodules identified. These include 3 mm left lung series 5, image 53 posteriorly. There are several previously seen which are similar today. There is also a new focus such as posterior left upper lobe series 5, image 46 measuring 4 mm. Recommend continued surveillance in 3 months. Musculoskeletal: Scattered moderate degenerative changes along the spine. Right-sided chest wall gas identified. Numerous right-sided rib fractures identified. This includes the fourth rib, fifth, sixth, seventh, eighth, ninth ribs. CT ABDOMEN PELVIS FINDINGS Hepatobiliary: No focal liver abnormality is seen. No gallstones, gallbladder wall thickening, or biliary dilatation. Patent portal vein Pancreas: Scattered atrophy of  the pancreas. Spleen: Normal in size without focal abnormality. Adrenals/Urinary Tract: The right adrenal gland is minimally thickened. There is a left adrenal nodule specifically seen which previously had Hounsfield unit of less than 0 consistent with an adenoma. No enhancing renal mass or collecting system dilatation. The ureters have normal course and caliber extending down to the urinary bladder. Preserved contour to the urinary bladder. Stomach/Bowel:  The stomach and small bowel are nondilated. Large bowel has a normal course and caliber with scattered colonic stool. Sigmoid colon diverticula. There is some minimal stranding seen adjacent to the sigmoid colon laterally such as axial series 3, image 96. Please correlate with any symptoms. Vascular/Lymphatic: Aortic atherosclerosis. No enlarged abdominal or pelvic lymph nodes. Reproductive: Status post hysterectomy. No adnexal masses. Other: No free intra-air or free fluid. Gas seen tracking along the right side of the anterior abdominal wall. Musculoskeletal: Scattered degenerative changes of the spine and pelvis. Of the there is streak artifact as the patient's arms were scanned at the patient's side. Critical Value/emergent results were called by telephone at the time of interpretation on 09/16/2023 at 11:15am to provider Ithan Touhey , who verbally acknowledged these results. IMPRESSION: Numerous right-sided rib fractures identified extending from the four hundred thirty-ninth ribs. Some of these are displaced. There is significant chest wall gas as well as a small to moderate right-sided pneumothorax. Dependent atelectasis. Trace high density fluid along the right pleural space or extrapleural region. Multiple lung nodules identified. There is at least 1 which may be new from the prior examination but still under 5 mm. Recommend follow up in 3 months. No bowel obstruction, free air or free fluid. No evidence of solid organ injury. Sigmoid colon diverticula. There  is slight stranding in this location. Please correlate for any clinical symptoms of a diverticulitis. Stable left adrenal nodules consistent with adenomas. Electronically Signed   By: Karen Kays M.D.   On: 09/16/2023 11:23   DG Chest Portable 1 View Result Date: 09/16/2023 CLINICAL DATA:  Pain after fall EXAM: PORTABLE CHEST 1 VIEW COMPARISON:  X-ray 08/31/2023 FINDINGS: Right lateral chest wall gas identified with a small right apical pneumothorax. Bandlike opacity seen along the right lung base greater than left. Atelectasis favored over infiltrate but recommend follow-up. No edema. No effusion. Normal cardiopericardial silhouette. Calcified aorta. Overlapping cardiac leads. Old left clavicle fracture. IMPRESSION: Small right apical pneumothorax.  Mild lung base opacity. Chest wall gas. Please see separate CT scan of the chest, abdomen and pelvis Electronically Signed   By: Karen Kays M.D.   On: 09/16/2023 11:09    Procedures .Critical Care  Performed by: Marita Kansas, PA-C Authorized by: Marita Kansas, PA-C   Critical care provider statement:    Critical care time (minutes):  30   Critical care was necessary to treat or prevent imminent or life-threatening deterioration of the following conditions:  Trauma   Critical care was time spent personally by me on the following activities:  Development of treatment plan with patient or surrogate, discussions with consultants, evaluation of patient's response to treatment, examination of patient, ordering and review of laboratory studies, ordering and review of radiographic studies, ordering and performing treatments and interventions, pulse oximetry, re-evaluation of patient's condition and review of old charts     Medications Ordered in ED Medications  fentaNYL (SUBLIMAZE) injection 50 mcg (50 mcg Intravenous Given 09/16/23 0922)  iohexol (OMNIPAQUE) 350 MG/ML injection 75 mL (75 mLs Intravenous Contrast Given 09/16/23 1036)  fentaNYL (SUBLIMAZE)  injection 50 mcg (50 mcg Intravenous Given 09/16/23 1135)  fentaNYL (SUBLIMAZE) injection 50 mcg (50 mcg Intravenous Given 09/16/23 1249)    ED Course/ Medical Decision Making/ A&P Clinical Course as of 09/16/23 1251  Tue Sep 16, 2023  0904 FOT.  [CC]    Clinical Course User Index [CC] Glyn Ade, MD  Medical Decision Making Amount and/or Complexity of Data Reviewed Labs: ordered. Radiology: ordered.  Risk Prescription drug management.   Medical Decision Making / ED Course   This patient presents to the ED for concern of fall, this involves an extensive number of treatment options, and is a complaint that carries with it a high risk of complications and morbidity.  The differential diagnosis includes acute intracranial bleed, multiple fractures, internal organ injury  MDM: 68 year old female presents today for concern of fall that occurred last night.  She fell onto her right side.  Endorses significant right-sided chest wall and abdominal pain.  She is on aspirin and Plavix for previous CAD s/p PCI.  Admission considered but will reevaluate after labs and imaging.  CBC reveals leukocytosis with mild left shift.  BMP with glucose of 130 otherwise without acute concern.  CT head and C-spine without acute concerns.  CT chest abdomen pelvis with contrast shows 6 right-sided rib fractures with mild to moderate pneumothorax.  No tension pneumothorax.  Discussed with trauma surgery.  They will evaluate patient.  They will plan on putting a chest tube in.  Patient agreeable with plan.  She is on supplemental O2 but this is chronic.  Lab Tests: -I ordered, reviewed, and interpreted labs.   The pertinent results include:   Labs Reviewed  CBC WITH DIFFERENTIAL/PLATELET - Abnormal; Notable for the following components:      Result Value   WBC 16.7 (*)    RDW 15.9 (*)    Neutro Abs 13.6 (*)    Monocytes Absolute 1.4 (*)    Abs Immature Granulocytes  0.17 (*)    All other components within normal limits  BASIC METABOLIC PANEL - Abnormal; Notable for the following components:   Glucose, Bld 130 (*)    All other components within normal limits  I-STAT CHEM 8, ED - Abnormal; Notable for the following components:   Glucose, Bld 130 (*)    Calcium, Ion 1.13 (*)    All other components within normal limits      EKG  EKG Interpretation Date/Time:    Ventricular Rate:    PR Interval:    QRS Duration:    QT Interval:    QTC Calculation:   R Axis:      Text Interpretation:           Imaging Studies ordered: I ordered imaging studies including chest x-ray, CT chest abdomen pelvis with contrast, CT head, CT C-spine I independently visualized and interpreted imaging. I agree with the radiologist interpretation   Medicines ordered and prescription drug management: Meds ordered this encounter  Medications   fentaNYL (SUBLIMAZE) injection 50 mcg   iohexol (OMNIPAQUE) 350 MG/ML injection 75 mL   fentaNYL (SUBLIMAZE) injection 50 mcg   fentaNYL (SUBLIMAZE) injection 50 mcg    -I have reviewed the patients home medicines and have made adjustments as needed  Consultations Obtained: I requested consultation with the trauma,  and discussed lab and imaging findings as well as pertinent plan - they recommend: As above   Cardiac Monitoring: The patient was maintained on a cardiac monitor.  I personally viewed and interpreted the cardiac monitored which showed an underlying rhythm of: NSR  Social Determinants of Health:  Factors impacting patients care include: lives alone   Reevaluation: After the interventions noted above, I reevaluated the patient and found that they have :stayed the same  Co morbidities that complicate the patient evaluation  Past Medical History:  Diagnosis Date  Allergy    seasonal   Anemia    Anxiety    takes Lexapro daily   Arthritis    "back, from neck down pass my bra area" (03/25/2017)    Asthma    Bartholin gland cyst 08/29/2011   Bruises easily    pt is on Effient   Chronic back pain    herniated nucleus pulposus   Chronic back pain    "neck to bra area; lower back" (03/25/2017)   Chronic kidney disease    recurrent UTI's this year 2022   COPD (chronic obstructive pulmonary disease) (HCC)    early stages   Coronary artery disease    Depression    takes Klonopin daily   Diabetes mellitus without complication (HCC)    Diverticulosis    Fibroadenoma of left breast    GERD (gastroesophageal reflux disease)    takes Nexium daily   H/O hiatal hernia    Heart attack (HCC) 2011   Hemorrhoids    Hernia    Hyperlipidemia    takes Lipitor daily   Hypertension    takes Losartan daily and Labetalol bid   Hypothyroidism    takes Synthroid daily   Insomnia    hydroxyzine prn   Joint pain    Pneumonia    "couple times" (03/25/2017)   Pre-diabetes    "just found out 1 wk ago" (03/25/2017)   Psoriasis    elbows,knees,back   Shortness of breath    with exertion   Slowing of urinary stream    Stress incontinence       Dispostion: Cussed with trauma.  They will admit patient.   Final Clinical Impression(s) / ED Diagnoses Final diagnoses:  Fall, initial encounter  Closed fracture of multiple ribs of right side, initial encounter  Traumatic pneumothorax, initial encounter    Rx / DC Orders ED Discharge Orders     None         Marita Kansas, PA-C 09/16/23 1304    Glyn Ade, MD 09/16/23 1529

## 2023-09-17 ENCOUNTER — Inpatient Hospital Stay (HOSPITAL_COMMUNITY)

## 2023-09-17 LAB — CBC
HCT: 41.1 % (ref 36.0–46.0)
Hemoglobin: 13.2 g/dL (ref 12.0–15.0)
MCH: 32.3 pg (ref 26.0–34.0)
MCHC: 32.1 g/dL (ref 30.0–36.0)
MCV: 100.5 fL — ABNORMAL HIGH (ref 80.0–100.0)
Platelets: 313 10*3/uL (ref 150–400)
RBC: 4.09 MIL/uL (ref 3.87–5.11)
RDW: 15.9 % — ABNORMAL HIGH (ref 11.5–15.5)
WBC: 12.9 10*3/uL — ABNORMAL HIGH (ref 4.0–10.5)
nRBC: 0 % (ref 0.0–0.2)

## 2023-09-17 LAB — BASIC METABOLIC PANEL
Anion gap: 6 (ref 5–15)
BUN: 11 mg/dL (ref 8–23)
CO2: 23 mmol/L (ref 22–32)
Calcium: 8.4 mg/dL — ABNORMAL LOW (ref 8.9–10.3)
Chloride: 107 mmol/L (ref 98–111)
Creatinine, Ser: 0.79 mg/dL (ref 0.44–1.00)
GFR, Estimated: >60 mL/min (ref >60)
Glucose, Bld: 129 mg/dL — ABNORMAL HIGH (ref 70–99)
Potassium: 4.5 mmol/L (ref 3.5–5.1)
Sodium: 136 mmol/L (ref 135–145)

## 2023-09-17 LAB — BLOOD GAS, ARTERIAL
Acid-Base Excess: 4.4 mmol/L — ABNORMAL HIGH (ref 0.0–2.0)
Bicarbonate: 29.2 mmol/L — ABNORMAL HIGH (ref 20.0–28.0)
Drawn by: 53527
O2 Saturation: 90.1 %
Patient temperature: 38.1
pCO2 arterial: 45 mmHg (ref 32–48)
pH, Arterial: 7.42 (ref 7.35–7.45)
pO2, Arterial: 64 mmHg — ABNORMAL LOW (ref 83–108)

## 2023-09-17 LAB — GLUCOSE, CAPILLARY
Glucose-Capillary: 117 mg/dL — ABNORMAL HIGH (ref 70–99)
Glucose-Capillary: 111 mg/dL — ABNORMAL HIGH (ref 70–99)
Glucose-Capillary: 150 mg/dL — ABNORMAL HIGH (ref 70–99)
Glucose-Capillary: 144 mg/dL — ABNORMAL HIGH (ref 70–99)

## 2023-09-17 LAB — MRSA NEXT GEN BY PCR, NASAL: MRSA by PCR Next Gen: NOT DETECTED

## 2023-09-17 MED ORDER — OXYCODONE HCL 5 MG PO TABS
5.0000 mg | ORAL_TABLET | ORAL | Status: DC | PRN
  Administered 2023-09-17 – 2023-09-25 (×19): 10 mg via ORAL
  Filled 2023-09-17 (×20): qty 2

## 2023-09-17 MED ORDER — METHOCARBAMOL 500 MG PO TABS
1000.0000 mg | ORAL_TABLET | Freq: Three times a day (TID) | ORAL | Status: AC
  Administered 2023-09-17 – 2023-09-19 (×6): 1000 mg via ORAL
  Filled 2023-09-17 (×6): qty 2

## 2023-09-17 MED ORDER — BUSPIRONE HCL 5 MG PO TABS
15.0000 mg | ORAL_TABLET | Freq: Every evening | ORAL | Status: DC
  Administered 2023-09-17 – 2023-09-24 (×8): 15 mg via ORAL
  Filled 2023-09-17 (×9): qty 3

## 2023-09-17 MED ORDER — ASPIRIN 81 MG PO TBEC
81.0000 mg | DELAYED_RELEASE_TABLET | Freq: Every day | ORAL | Status: DC
  Administered 2023-09-17 – 2023-09-25 (×9): 81 mg via ORAL
  Filled 2023-09-17 (×9): qty 1

## 2023-09-17 MED ORDER — METHOCARBAMOL 500 MG PO TABS
500.0000 mg | ORAL_TABLET | Freq: Three times a day (TID) | ORAL | Status: DC
  Administered 2023-09-17: 500 mg via ORAL
  Filled 2023-09-17: qty 1

## 2023-09-17 MED ORDER — BUSPIRONE HCL 5 MG PO TABS
15.0000 mg | ORAL_TABLET | Freq: Every day | ORAL | Status: DC
  Administered 2023-09-17 – 2023-09-25 (×9): 15 mg via ORAL
  Filled 2023-09-17 (×9): qty 3

## 2023-09-17 MED ORDER — GUAIFENESIN ER 600 MG PO TB12
600.0000 mg | ORAL_TABLET | Freq: Two times a day (BID) | ORAL | Status: DC
  Administered 2023-09-17 – 2023-09-25 (×17): 600 mg via ORAL
  Filled 2023-09-17 (×17): qty 1

## 2023-09-17 MED ORDER — BUSPIRONE HCL 5 MG PO TABS
30.0000 mg | ORAL_TABLET | Freq: Every morning | ORAL | Status: DC
  Administered 2023-09-17 – 2023-09-25 (×8): 30 mg via ORAL
  Filled 2023-09-17 (×8): qty 6

## 2023-09-17 MED ORDER — LIDOCAINE 5 % EX PTCH
1.0000 | MEDICATED_PATCH | CUTANEOUS | Status: DC
  Administered 2023-09-17 – 2023-09-25 (×9): 1 via TRANSDERMAL
  Filled 2023-09-17 (×9): qty 1

## 2023-09-17 MED ORDER — FUROSEMIDE 10 MG/ML IJ SOLN
20.0000 mg | Freq: Once | INTRAMUSCULAR | Status: AC
  Administered 2023-09-17: 20 mg via INTRAVENOUS
  Filled 2023-09-17: qty 2

## 2023-09-17 MED ORDER — METHOCARBAMOL 1000 MG/10ML IJ SOLN: 500.0000 mg | Freq: Three times a day (TID) | INTRAMUSCULAR | Status: DC

## 2023-09-17 NOTE — Evaluation (Signed)
 Physical Therapy Evaluation Patient Details Name: [name] [name] MRN: [id number] DOB: 1955/09/24 Today's Date: 09/17/2023  History of Present Illness  68 y.o. female presents to [facility] 09/16/23 after falling out of bed with R sided rib fxs 4-9 and R PTX. Prior admit 08/31/23 w/ acute COPD exacerbation and Aspiration PNA. PMH: HTN, CAD with multiple DES, DM2, COPD, anxiety, hypothyroidism, RTC repair 12/24/2021, ongoing tobacco abuse.   Clinical Impression  Pt in recliner upon arrival and agreeable to PT eval. PTA, pt was ModI with a rollator. In today's session, pt required supervision to stand and ambulate ~74ft with a RW. Pt needed ModA for return to supine for LE management. Discussed sleeping in a recliner if pt continues to have discomfort with bed mobility. Pt mentioned being able to stay with her daughter upon leaving the hospital for intermittent assistance. Pt currently with functional limitations due to the deficits listed below (see PT Problem List). Pt would benefit from acute skilled PT to address functional impairments. Recommending post-acute HHPT to work towards independence with mobility. Acute PT to follow.         If plan is discharge home, recommend the following: Assist for transportation;Assistance with cooking/housework;A little help with walking and/or transfers;Help with stairs or ramp for entrance   Can travel by private vehicle    Yes    Equipment Recommendations None recommended by PT     Functional Status Assessment Patient has had a recent decline in their functional status and demonstrates the ability to make significant improvements in function in a reasonable and predictable amount of time.     Precautions / Restrictions Precautions Precautions: Other (comment);Fall Precaution/Restrictions Comments: watch SpO2, chest tube Restrictions Weight Bearing Restrictions Per Provider Order: No      Mobility  Bed Mobility Overal bed mobility: Needs Assistance Bed  Mobility: Rolling, Sidelying to Sit Rolling: Supervision Sidelying to sit: Mod assist   General bed mobility comments: ModA to assist with bringing LE's onto bed.    Transfers Overall transfer level: Needs assistance Equipment used: Rolling walker (2 wheels) Transfers: Sit to/from Stand Sit to Stand: Supervision    General transfer comment: supervision for safety    Ambulation/Gait Ambulation/Gait assistance: Supervision Gait Distance (Feet): 80 Feet Assistive device: Rolling walker (2 wheels) Gait Pattern/deviations: Step-through pattern, Decreased stride length Gait velocity: decreased     General Gait Details: decreased stride length and gait speed. Reported fatigue after ~60 ft. SpO2 >92% on 3L       Balance Overall balance assessment: Needs assistance Sitting-balance support: No upper extremity supported, Feet supported Sitting balance-Leahy Scale: Good     Standing balance support: Bilateral upper extremity supported Standing balance-Leahy Scale: Fair Standing balance comment: able to stand w/ no AD, reliant on RW during gait        Pertinent Vitals/Pain Pain Assessment Pain Assessment: Faces Faces Pain Scale: Hurts little more Pain Location: R ribs Pain Descriptors / Indicators: Aching, Discomfort Pain Intervention(s): Limited activity within patient's tolerance, Monitored during session, Repositioned    Home Living Family/patient expects to be discharged to:: Private residence Living Arrangements: Alone Available Help at Discharge: Family;Available PRN/intermittently (daughter) Type of Home: Apartment Home Access: Stairs to enter Entrance Stairs-Rails: Left Entrance Stairs-Number of Steps: 2   Home Layout: One level Home Equipment: Agricultural consultant (2 wheels);Shower seat;Hand held shower head;Grab bars - tub/shower;Rollator (4 wheels) (pulse ox) Additional Comments: Above info for pt's house, pt mentioned she could stay with daughter. ~4 steps to  enter home  Prior Function Prior Level of Function : Independent/Modified Independent      Mobility Comments: uses rollator ADLs Comments: does not drive. Mod I with ADLs     Extremity/Trunk Assessment   Upper Extremity Assessment Upper Extremity Assessment: Defer to OT evaluation    Lower Extremity Assessment Lower Extremity Assessment: Generalized weakness    Cervical / Trunk Assessment Cervical / Trunk Assessment: Normal  Communication   Communication Communication: No apparent difficulties    Cognition Arousal: Alert Behavior During Therapy: WFL for tasks assessed/performed   PT - Cognitive impairments: No apparent impairments      Following commands: Intact       Cueing Cueing Techniques: Verbal cues     General Comments General comments (skin integrity, edema, etc.): SpO2 >92% on 3L O2 via [address]. pt does exhibit mild dyspnea     PT Assessment Patient needs continued PT services  PT Problem List Decreased strength;Decreased activity tolerance;Decreased mobility;Cardiopulmonary status limiting activity;Decreased balance       PT Treatment Interventions Gait training;Functional mobility training;Stair training;Balance training;Patient/family education;DME instruction;Therapeutic activities;Therapeutic exercise;Neuromuscular re-education    PT Goals (Current goals can be found in the Care Plan section)  Acute Rehab PT Goals Patient Stated Goal: to get better PT Goal Formulation: With patient Time For Goal Achievement: 10/01/23 Potential to Achieve Goals: Good    Frequency Min 3X/week        AM-PAC PT "6 Clicks" Mobility  Outcome Measure Help needed turning from your back to your side while in a flat bed without using bedrails?: None Help needed moving from lying on your back to sitting on the side of a flat bed without using bedrails?: A Little Help needed moving to and from a bed to a chair (including a wheelchair)?: A Little Help needed standing  up from a chair using your arms (e.g., wheelchair or bedside chair)?: A Little Help needed to walk in hospital room?: A Little Help needed climbing 3-5 steps with a railing? : A Little 6 Click Score: 19    End of Session Equipment Utilized During Treatment: Oxygen Activity Tolerance: Patient tolerated treatment well Patient left: in bed;with call bell/phone within reach Nurse Communication: Mobility status PT Visit Diagnosis: Muscle weakness (generalized) (M62.81);Unsteadiness on feet (R26.81)    Time: 4098-1191 PT Time Calculation (min) (ACUTE ONLY): 23 min   Charges:   PT Evaluation $PT Eval Low Complexity: 1 Low   PT General Charges $$ ACUTE PT VISIT: 1 Visit        Hilton Cork, PT, DPT Secure Chat Preferred  Rehab Office [phone]   Arturo Morton Brion Aliment 09/17/2023, 1:30 PM

## 2023-09-17 NOTE — Progress Notes (Signed)
 Trauma Event Note    Reason for Call :  Pt having resp distress  Initial Focused Assessment: Rapid response RN at bedside- called this TRN-- Pt having resp difficulty- RR RN gave 1 neb, placed on NRB , ordered CXR--  TRN notified TMD Burton. Orders received.  Pt placed on heated high flow O2, incentive spirometer instructions given- pulled only 300.    Last imported Vital Signs BP (!) 154/81   Pulse 92   Temp (!) 100.6 F (38.1 C) (Axillary)   Resp 17   Ht 4\' 9"  (1.448 [name])   Wt 150 lb 12.7 oz (68.4 kg)   SpO2 91%   BMI 32.63 kg/[name]   Trending CBC Recent Labs    09/16/23 0900 09/16/23 0917 09/17/23 0243  WBC 16.7*  --  12.9*  HGB 14.2 15.0 13.2  HCT 44.1 44.0 41.1  PLT 381  --  313    Trending Coag's No results for input(s): "APTT", "INR" in the last 72 hours.  Trending BMET Recent Labs    09/16/23 0900 09/16/23 0917 09/17/23 0243  NA 140 140 136  K 4.1 4.0 4.5  CL 106 103 107  CO2 26  --  23  BUN 8 9 11   CREATININE 0.95 1.00 0.79  GLUCOSE 130* 130* 129*      [name] [name] [name] [name]  Trauma Response RN  Please call TRN at [phone] for further assistance.

## 2023-09-17 NOTE — Significant Event (Addendum)
 Rapid Response Event Note   Reason for Call :  Respiratory distress, acute oxygen desaturation to 86% on 4LNC  Initial Focused Assessment:  Pt lying in bed, AO. Breathing pattern is tachypneic, labored. Breath sounds are coarse, rhonchus. Congested cough, pt having trouble clearing. Heart rate regular. Skin is warm, dry, pink.   Chest tube currently to -20 H2O. 50-60 mL of bloody output noted in atrium. Dressing intact.   Encouraged cough and deep breathing, however pt taking shallow breaths and endorsing that it is painful to cough.  Provided pt an incentive spirometer and flutter valve. Education with teach back. Education needs reinforcement.  Pt achieved on incentive spirometer for 5 attempts. Pt provided 3-4 breaths on flutter valve.   VS: T 100.64F, BP 154/81, HR 94, RR 25, SpO2 93% on 100% NRB CBG: 150  Interventions:  -Albuterol neb being administered on my arrival -CXR -Flutter valve and incentive spirometer -HFNC  Per provider:  -ABG: 7.42/ 45/ 64/ 29.2 -Lasix  Plan of Care:  -Encourage IS and flutter valve, cough and deep breathing  Event Summary:  MD Notified: Adin Hector, PA Call Time: 8295 Arrival Time: 1640 End Time: 1715  Jennye Moccasin, RN

## 2023-09-17 NOTE — Plan of Care (Signed)

## 2023-09-17 NOTE — Progress Notes (Signed)
   09/17/23 1600  Spiritual Encounters  Type of Visit Initial  OnCall Visit Yes   Chaplain was paged to rapid response. Chaplain was present, attempted to visit but pt was unavailable. Chaplain will be available, if needed.  M.Kubra Delano Metz Resident [phone]

## 2023-09-17 NOTE — Progress Notes (Signed)
 Called to patients room saying she cannot breath. Pt was laying in bed chin tuck to the chest, very uncomfortable. Sounded wet, pulled up in bed. PRN albuterol administered. Pt's sat was in 86% in 4l o2. Pt kept saying she can't breath. Consulting civil engineer in the room, rapid notified, Rapid RN and Trauma RN was on bedside. IN a little bit Trauma PA was on bedside. Pt had to be on 100% non-re-breather for a little bit, switched her to 5l HFNC. Respiratory was by bedside for stat blood gas, stat chest x-ray completed. 20 mg IV lasix administered. Patient switched to 10 L heated high flow per RT and Rapid RN. Patient stabilized, IS and flutter valve on bedside, pt educated. Voiced understanding. Younker within reach. Call bell with reach. Plan of care continues.

## 2023-09-17 NOTE — Progress Notes (Signed)
 Progress Note     Subjective: Pt was getting up to the restroom with OT. Reports pain from ribs and pain with coughing/moving as expected. We discussed chest tube management. We also discussed that given many recent hospitalizations and severe COPD her outpatient pulmonologist recommended a palliative care consult. We discussed what that means and she actually would like to have that while here and would like her daughter to be present.   Objective: Vital signs in last 24 hours: Temp:  [97.8 F (36.6 C)-98.6 F (37 C)] 97.9 F (36.6 C) (03/05 1113) Pulse Rate:  [56-72] 65 (03/05 1113) Resp:  [12-20] 19 (03/05 1113) BP: (105-141)/(55-81) 126/57 (03/05 1113) SpO2:  [90 %-100 %] 97 % (03/05 1113) Weight:  [68.4 kg] 68.4 kg (03/05 0523)    Intake/Output from previous day: 03/04 0701 - 03/05 0700 In: 453.4 [I.V.:453.4] Out: -  Intake/Output this shift: No intake/output data recorded.  PE: General: pleasant, WD, chronically ill appearing female, NAD Heart: regular, rate, and rhythm.   Lungs: some expiratory wheezing bilaterally. Belk present. R CT present with SS output and +air leak Psych: A&Ox3 with an appropriate affect.    Lab Results:  Recent Labs    09/16/23 0900 09/16/23 0917 09/17/23 0243  WBC 16.7*  --  12.9*  HGB 14.2 15.0 13.2  HCT 44.1 44.0 41.1  PLT 381  --  313   BMET Recent Labs    09/16/23 0900 09/16/23 0917 09/17/23 0243  NA 140 140 136  K 4.1 4.0 4.5  CL 106 103 107  CO2 26  --  23  GLUCOSE 130* 130* 129*  BUN 8 9 11   CREATININE 0.95 1.00 0.79  CALCIUM 9.2  --  8.4*   PT/INR No results for input(s): "LABPROT", "INR" in the last 72 hours. CMP     Component Value Date/Time   NA 136 09/17/2023 0243   NA 142 06/26/2023 1339   K 4.5 09/17/2023 0243   CL 107 09/17/2023 0243   CO2 23 09/17/2023 0243   GLUCOSE 129 (H) 09/17/2023 0243   BUN 11 09/17/2023 0243   BUN 14 06/26/2023 1339   CREATININE 0.79 09/17/2023 0243   CREATININE 0.80  03/24/2023 1232   CREATININE 0.93 04/16/2016 0937   CALCIUM 8.4 (L) 09/17/2023 0243   PROT 5.9 (L) 09/01/2023 0527   PROT 6.2 06/26/2023 1339   ALBUMIN 3.0 (L) 09/01/2023 0527   ALBUMIN 4.0 06/26/2023 1339   AST 16 09/01/2023 0527   AST 19 03/24/2023 1232   ALT 18 09/01/2023 0527   ALT 23 03/24/2023 1232   ALKPHOS 60 09/01/2023 0527   BILITOT 0.7 09/01/2023 0527   BILITOT 0.4 06/26/2023 1339   BILITOT 0.4 03/24/2023 1232   GFRNONAA >60 09/17/2023 0243   GFRNONAA >60 03/24/2023 1232   GFRNONAA 67 04/16/2016 0937   GFRAA 77 04/05/2019 0926   GFRAA 78 04/16/2016 0937   Lipase     Component Value Date/Time   LIPASE 23 02/11/2021 0416       Studies/Results: DG Chest Port 1 View Result Date: 09/17/2023 CLINICAL DATA:  68 year old female with right side chest tube, pneumothorax, fall with numerous right side rib fractures. EXAM: PORTABLE CHEST 1 VIEW COMPARISON:  Chest CT yesterday. FINDINGS: Portable AP semi upright view at 0608 hours. Pigtail right chest tube in place. Moderate right chest wall and supraclavicular subcutaneous gas persists. Decreased right pneumothorax, trace pleural edge visible inside the right lateral 3rd rib. Mildly lower lung volumes. Stable cardiac size  and mediastinal contours. Probable left lung base atelectasis. No pulmonary edema. Negative visible bowel gas. Right lateral rib fractures better demonstrated by CT. IMPRESSION: 1. Right chest tube with trace residual right pneumothorax. Persistent right chest wall gas. 2. Lower lung volumes with increased left lung base opacity, probably atelectasis. Electronically Signed   By: Odessa Fleming M.D.   On: 09/17/2023 07:12   DG Chest Portable 1 View Result Date: 09/16/2023 CLINICAL DATA:  Rib fractures, pneumothorax, post chest tube placement EXAM: PORTABLE CHEST - 1 VIEW COMPARISON:  Earlier film of the same day FINDINGS: Right pigtail chest tube placement, directed towards the hilum. Small residual apical pneumothorax.  Increase in right lateral subcutaneous emphysema. New patchy right perihilar airspace opacities. Left lung clear. Heart size and mediastinal contours are within normal limits. Aortic Atherosclerosis (ICD10-170.0). No effusion. Right rib fracture deformities laterally as before. IMPRESSION: 1. Right chest tube with small residual apical pneumothorax. 2. New right perihilar airspace disease. Electronically Signed   By: Corlis Leak M.D.   On: 09/16/2023 16:03   CT Head Wo Contrast Result Date: 09/16/2023 CLINICAL DATA:  Trauma, ground level fall onto right side last night, chest and arm pain. EXAM: CT HEAD WITHOUT CONTRAST CT CERVICAL SPINE WITHOUT CONTRAST TECHNIQUE: Multidetector CT imaging of the head and cervical spine was performed following the standard protocol without intravenous contrast. Multiplanar CT image reconstructions of the cervical spine were also generated. RADIATION DOSE REDUCTION: This exam was performed according to the departmental dose-optimization program which includes automated exposure control, adjustment of the mA and/or kV according to patient size and/or use of iterative reconstruction technique. COMPARISON:  MRI cervical spine 03/16/2021, CT head 04/16/2012 FINDINGS: CT HEAD FINDINGS Brain: No acute intracranial hemorrhage. No CT evidence of acute infarct. Nonspecific hypoattenuation in the periventricular and subcortical white matter favored to reflect chronic microvascular ischemic changes. 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 No edema, mass effect, or midline shift. The basilar cisterns are patent. Ventricles: Prominence of the ventricles suggesting underlying parenchymal volume loss. Vascular: Atherosclerotic calcifications of the carotid siphons. No hyperdense vessel. Skull: No acute or aggressive finding. Orbits: Left lens replacement.  Orbits otherwise symmetric. Sinuses: Mild mucosal thickening in the ethmoid sinuses. Small osteoma in the right ethmoid sinus. Concha  bullosa on the right with leftward nasal septal deviation. Other: Mastoid air cells are clear. CT CERVICAL SPINE FINDINGS Alignment: Straightening of the normal cervical lordosis. No listhesis. No facet subluxation or dislocation. Skull base and vertebrae: No acute fracture or compression fracture in the cervical spine. Degenerative endplate changes most pronounced on the right posteriorly at C5-6 and C6-7. No suspicious osseous lesion. Soft tissues and spinal canal: No prevertebral fluid or swelling. No visible canal hematoma. Elongation of the bilateral styloid processes with calcification of the stylohyoid ligaments. Disc levels: Mild disc space narrowing at C5-6 and C6-7. Central disc protrusion at C4-5 resulting in at least mild spinal canal stenosis. Additional disc osteophyte complexes at C5-6 and C6-7 resulting in mild spinal canal stenosis. Facet arthrosis most pronounced on the right at C5-6 and C6-7. Foraminal narrowing greatest on the right at C5-6 and C6-7. Upper chest: Partially visualized right pneumothorax. Gas noted along the right anterolateral chest wall. Emphysema in the lung apices. Other: None. IMPRESSION: CT HEAD: 1. No acute intracranial abnormality. 2. Moderate chronic microvascular ischemic changes and mild parenchymal volume loss. 3. 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 CT CERVICAL SPINE: 1. No acute fracture or traumatic malalignment of the cervical spine. 2. Partially  visualized right pneumothorax. 3. Multilevel degenerative changes of the cervical spine, as described above. 4. Elongation of the bilateral styloid processes with calcification of the stylohyoid ligaments. Correlate for symptoms of Eagle syndrome. Electronically Signed   By: Emily Filbert M.D.   On: 09/16/2023 12:46   CT Cervical Spine Wo Contrast Result Date: 09/16/2023 CLINICAL DATA:  Trauma, ground level fall onto right side last night, chest and arm pain. EXAM: CT HEAD WITHOUT CONTRAST CT CERVICAL SPINE  WITHOUT CONTRAST TECHNIQUE: Multidetector CT imaging of the head and cervical spine was performed following the standard protocol without intravenous contrast. Multiplanar CT image reconstructions of the cervical spine were also generated. RADIATION DOSE REDUCTION: This exam was performed according to the departmental dose-optimization program which includes automated exposure control, adjustment of the mA and/or kV according to patient size and/or use of iterative reconstruction technique. COMPARISON:  MRI cervical spine 03/16/2021, CT head 04/16/2012 FINDINGS: CT HEAD FINDINGS Brain: No acute intracranial hemorrhage. No CT evidence of acute infarct. Nonspecific hypoattenuation in the periventricular and subcortical white matter favored to reflect chronic microvascular ischemic changes. 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 No edema, mass effect, or midline shift. The basilar cisterns are patent. Ventricles: Prominence of the ventricles suggesting underlying parenchymal volume loss. Vascular: Atherosclerotic calcifications of the carotid siphons. No hyperdense vessel. Skull: No acute or aggressive finding. Orbits: Left lens replacement.  Orbits otherwise symmetric. Sinuses: Mild mucosal thickening in the ethmoid sinuses. Small osteoma in the right ethmoid sinus. Concha bullosa on the right with leftward nasal septal deviation. Other: Mastoid air cells are clear. CT CERVICAL SPINE FINDINGS Alignment: Straightening of the normal cervical lordosis. No listhesis. No facet subluxation or dislocation. Skull base and vertebrae: No acute fracture or compression fracture in the cervical spine. Degenerative endplate changes most pronounced on the right posteriorly at C5-6 and C6-7. No suspicious osseous lesion. Soft tissues and spinal canal: No prevertebral fluid or swelling. No visible canal hematoma. Elongation of the bilateral styloid processes with calcification of the stylohyoid ligaments. Disc levels: Mild  disc space narrowing at C5-6 and C6-7. Central disc protrusion at C4-5 resulting in at least mild spinal canal stenosis. Additional disc osteophyte complexes at C5-6 and C6-7 resulting in mild spinal canal stenosis. Facet arthrosis most pronounced on the right at C5-6 and C6-7. Foraminal narrowing greatest on the right at C5-6 and C6-7. Upper chest: Partially visualized right pneumothorax. Gas noted along the right anterolateral chest wall. Emphysema in the lung apices. Other: None. IMPRESSION: CT HEAD: 1. No acute intracranial abnormality. 2. Moderate chronic microvascular ischemic changes and mild parenchymal volume loss. 3. 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 CT CERVICAL SPINE: 1. No acute fracture or traumatic malalignment of the cervical spine. 2. Partially visualized right pneumothorax. 3. Multilevel degenerative changes of the cervical spine, as described above. 4. Elongation of the bilateral styloid processes with calcification of the stylohyoid ligaments. Correlate for symptoms of Eagle syndrome. Electronically Signed   By: Emily Filbert M.D.   On: 09/16/2023 12:46   CT CHEST ABDOMEN PELVIS W CONTRAST Result Date: 09/16/2023 CLINICAL DATA:  Right-sided chest wall, arm and hip pain after ground level fall. EXAM: CT CHEST, ABDOMEN, AND PELVIS WITH CONTRAST TECHNIQUE: Multidetector CT imaging of the chest, abdomen and pelvis was performed following the standard protocol during bolus administration of intravenous contrast. RADIATION DOSE REDUCTION: This exam was performed according to the departmental dose-optimization program which includes automated exposure control, adjustment of the mA and/or kV according to patient size  and/or use of iterative reconstruction technique. CONTRAST:  75mL OMNIPAQUE IOHEXOL 350 MG/ML SOLN COMPARISON:  X-ray earlier 09/16/2023 of the chest. Screening chest CT 02/07/2023. Abdomen and pelvis CT without contrast 12/16/2020. FINDINGS: CT CHEST FINDINGS  Cardiovascular: Heart is nonenlarged. Trace pericardial fluid. Coronary artery calcifications are seen. The thoracic aorta is normal course and caliber with mild atherosclerotic calcified plaque. Plaque also seen along the origin of the great vessels. Mediastinum/Nodes: Normal caliber thoracic esophagus. Atrophic thyroid gland. No specific abnormal lymph node enlargement identified in the axillary regions, hilum or mediastinum. Lungs/Pleura: Small right-sided pneumothorax particularly anterior and towards the right lung apex. Some medial areas as well. No left-sided pneumothorax. Bandlike opacity along the lung bases, right-greater-than-left. Atelectasis is favored. Is also some presumed mucous plugging along the right lower lobe. Trace right-sided pleural fluid with possible component hematoma. Underlying centrilobular emphysematous lung changes. There also several scattered noncalcified small lung nodules identified. These include 3 mm left lung series 5, image 53 posteriorly. There are several previously seen which are similar today. There is also a new focus such as posterior left upper lobe series 5, image 46 measuring 4 mm. Recommend continued surveillance in 3 months. Musculoskeletal: Scattered moderate degenerative changes along the spine. Right-sided chest wall gas identified. Numerous right-sided rib fractures identified. This includes the fourth rib, fifth, sixth, seventh, eighth, ninth ribs. CT ABDOMEN PELVIS FINDINGS Hepatobiliary: No focal liver abnormality is seen. No gallstones, gallbladder wall thickening, or biliary dilatation. Patent portal vein Pancreas: Scattered atrophy of the pancreas. Spleen: Normal in size without focal abnormality. Adrenals/Urinary Tract: The right adrenal gland is minimally thickened. There is a left adrenal nodule specifically seen which previously had Hounsfield unit of less than 0 consistent with an adenoma. No enhancing renal mass or collecting system dilatation. The  ureters have normal course and caliber extending down to the urinary bladder. Preserved contour to the urinary bladder. Stomach/Bowel: The stomach and small bowel are nondilated. Large bowel has a normal course and caliber with scattered colonic stool. Sigmoid colon diverticula. There is some minimal stranding seen adjacent to the sigmoid colon laterally such as axial series 3, image 96. Please correlate with any symptoms. Vascular/Lymphatic: Aortic atherosclerosis. No enlarged abdominal or pelvic lymph nodes. Reproductive: Status post hysterectomy. No adnexal masses. Other: No free intra-air or free fluid. Gas seen tracking along the right side of the anterior abdominal wall. Musculoskeletal: Scattered degenerative changes of the spine and pelvis. Of the there is streak artifact as the patient's arms were scanned at the patient's side. Critical Value/emergent results were called by telephone at the time of interpretation on 09/16/2023 at 11:15am to provider AMJAD ALI , who verbally acknowledged these results. IMPRESSION: Numerous right-sided rib fractures identified extending from the four hundred thirty-ninth ribs. Some of these are displaced. There is significant chest wall gas as well as a small to moderate right-sided pneumothorax. Dependent atelectasis. Trace high density fluid along the right pleural space or extrapleural region. Multiple lung nodules identified. There is at least 1 which may be new from the prior examination but still under 5 mm. Recommend follow up in 3 months. No bowel obstruction, free air or free fluid. No evidence of solid organ injury. Sigmoid colon diverticula. There is slight stranding in this location. Please correlate for any clinical symptoms of a diverticulitis. Stable left adrenal nodules consistent with adenomas. Electronically Signed   By: Karen Kays M.D.   On: 09/16/2023 11:23   DG Chest Portable 1 View Result Date: 09/16/2023 CLINICAL DATA:  Pain after fall EXAM: PORTABLE  CHEST 1 VIEW COMPARISON:  X-ray 08/31/2023 FINDINGS: Right lateral chest wall gas identified with a small right apical pneumothorax. Bandlike opacity seen along the right lung base greater than left. Atelectasis favored over infiltrate but recommend follow-up. No edema. No effusion. Normal cardiopericardial silhouette. Calcified aorta. Overlapping cardiac leads. Old left clavicle fracture. IMPRESSION: Small right apical pneumothorax.  Mild lung base opacity. Chest wall gas. Please see separate CT scan of the chest, abdomen and pelvis Electronically Signed   By: Karen Kays M.D.   On: 09/16/2023 11:09    Anti-infectives: Anti-infectives (From admission, onward)    None        Assessment/Plan  Fall out of bed R rib fractures 4-9 - pulm toilet, IS, pain control, mobilization R PTX - pigtail placed in trauma bay.  Follow up CXR this AM looks improved but patient with air leak on exam, keep to -20 today and repeat CXR in AM Chronic respiratory failure on home O2/COPD - continue [place] O2.  Sating well right now on 3L CAD/MI - continue ASA/Plavix DM - SSI HTN - home meds HLD Depression/Anxiety Hypothyroidism - home meds  FEN - carb mod diet VTE - ASA/Plavix ID - none needed  DISPO - 4E, continue R CT on -20 cm suction and repeat CXR in AM. Palliative consult for symptom management and overall GOC per discussion with Dr. Francine Graven and patient. Continue therapies.   LOS: 1 day   I reviewed last 24 h vitals and pain scores, last 48 h intake and output, last 24 h labs and trends, and last 24 h imaging results.  This care required moderate level of medical decision making.    Juliet Rude, [facility] 09/17/2023, 11:26 AM Please see Amion for pager number during day hours 7:00am-4:30pm

## 2023-09-17 NOTE — Progress Notes (Signed)
 RT placed patient on HHFNC per MD order. Patient tolerating well. RT encouraged patient to cough and use flutter to help with getting secretions up. RT will continue to monitor.

## 2023-09-17 NOTE — Evaluation (Signed)
 Occupational Therapy Evaluation Patient Details Name: [name] [name] MRN: [id number] DOB: 02-18-1956 Today's Date: 09/17/2023   History of Present Illness   68 y.o. female presents to [facility] 09/16/23 after falling out of bed with R sided rib fxs 4-9 and R PTX. Prior admit 08/31/23 w/ acute COPD exacerbation and Aspiration PNA. PMH: HTN, CAD with multiple DES, DM2, COPD, anxiety, hypothyroidism, RTC repair 12/24/2021, ongoing tobacco abuse.     Clinical Impressions PTA, pt lived alone and was mod I in her home; daughter typically takes her grocery shopping on the weekends. Upon evaluation, pt needing min A for bed mobility and CGA-supervision for transfers/functional mobility. Up to min A for LB ADL secondary to rib discomfort. Pt also with decreased comfortable ROM of RUE at this time but overall using RUE functionally. Recommending discharge home with HHOT. Per PT, pt can also stay with daughter temporarily if needed.      If plan is discharge home, recommend the following:   A little help with walking and/or transfers;A little help with bathing/dressing/bathroom;Assistance with cooking/housework;Assist for transportation;Help with stairs or ramp for entrance     Functional Status Assessment   Patient has had a recent decline in their functional status and demonstrates the ability to make significant improvements in function in a reasonable and predictable amount of time.     Equipment Recommendations   None recommended by OT     Recommendations for Other Services         Precautions/Restrictions   Precautions Precautions: Other (comment) Precaution/Restrictions Comments: watch SpO2     Mobility Bed Mobility Overal bed mobility: Needs Assistance Bed Mobility: Rolling, Sidelying to Sit Rolling: Supervision Sidelying to sit: Min assist       General bed mobility comments: light assist to elevate trunk predominantly for pt comfort    Transfers Overall transfer level:  Needs assistance Equipment used: Rolling walker (2 wheels) Transfers: Sit to/from Stand Sit to Stand: Supervision           General transfer comment: CGA initially improving to supervision by end of session      Balance Overall balance assessment: Needs assistance Sitting-balance support: No upper extremity supported, Feet supported Sitting balance-Leahy Scale: Good     Standing balance support: Bilateral upper extremity supported Standing balance-Leahy Scale: Fair                             ADL either performed or assessed with clinical judgement   ADL Overall ADL's : Needs assistance/impaired Eating/Feeding: Independent;Sitting   Grooming: Supervision/safety;Standing   Upper Body Bathing: Set up;Sitting   Lower Body Bathing: Minimal assistance;Sit to/from stand   Upper Body Dressing : Set up;Sitting   Lower Body Dressing: Minimal assistance;Sit to/from stand   Toilet Transfer: Ambulation;Rolling walker (2 wheels);Supervision/safety   Toileting- Clothing Manipulation and Hygiene: Modified independent;Sitting/lateral lean       Functional mobility during ADLs: Supervision/safety;Rolling walker (2 wheels);Set up       Vision Patient Visual Report: No change from baseline       Perception         Praxis         Pertinent Vitals/Pain Pain Assessment Pain Assessment: Faces Faces Pain Scale: Hurts little more Pain Location: R ribs Pain Descriptors / Indicators: Aching, Discomfort Pain Intervention(s): Limited activity within patient's tolerance, Monitored during session     Extremity/Trunk Assessment Upper Extremity Assessment Upper Extremity Assessment: Generalized weakness   Lower Extremity Assessment  Lower Extremity Assessment: Defer to PT evaluation   Cervical / Trunk Assessment Cervical / Trunk Assessment: Normal   Communication Communication Communication: No apparent difficulties   Cognition Arousal: Alert Behavior During  Therapy: WFL for tasks assessed/performed Cognition: No apparent impairments                               Following commands: Intact       Cueing  General Comments   Cueing Techniques: Verbal cues  SpO2 >92% on 3L O2 via [place]. pt does exhibit mild dyspnea   Exercises     Shoulder Instructions      Home Living Family/patient expects to be discharged to:: Private residence Living Arrangements: Alone Available Help at Discharge: Family;Available PRN/intermittently (daughter) Type of Home: Apartment Home Access: Stairs to enter Entrance Stairs-Number of Steps: 2 Entrance Stairs-Rails: Left Home Layout: One level     Bathroom Shower/Tub: Estate manager/land agent Accessibility: Yes   Home Equipment: Agricultural consultant (2 wheels);Shower seat;Hand held shower head;Grab bars - tub/shower;Rollator (4 wheels) (pulse ox)          Prior Functioning/Environment Prior Level of Function : Independent/Modified Independent             Mobility Comments: uses rollator ADLs Comments: does not drive. Mod I with ADLs    OT Problem List: Decreased strength;Decreased activity tolerance;Impaired balance (sitting and/or standing);Cardiopulmonary status limiting activity   OT Treatment/Interventions: Self-care/ADL training;Therapeutic exercise;DME and/or AE instruction;Therapeutic activities;Patient/family education;Balance training      OT Goals(Current goals can be found in the care plan section)   Acute Rehab OT Goals Patient Stated Goal: get better OT Goal Formulation: With patient Time For Goal Achievement: 10/01/23 Potential to Achieve Goals: Good ADL Goals Pt Will Perform Grooming: with modified independence;standing Pt Will Perform Lower Body Dressing: with set-up;sit to/from stand Pt Will Transfer to Toilet: with modified independence;ambulating   OT Frequency:  Min 1X/week    Co-evaluation              AM-PAC OT "6 Clicks" Daily Activity      Outcome Measure Help from another person eating meals?: None Help from another person taking care of personal grooming?: A Little Help from another person toileting, which includes using toliet, bedpan, or urinal?: A Little Help from another person bathing (including washing, rinsing, drying)?: A Little Help from another person to put on and taking off regular upper body clothing?: A Little Help from another person to put on and taking off regular lower body clothing?: A Little 6 Click Score: 19   End of Session Equipment Utilized During Treatment: Gait belt;Rolling walker (2 wheels) Nurse Communication: Mobility status  Activity Tolerance: Patient tolerated treatment well Patient left: in chair;with call bell/phone within reach;with chair alarm set  OT Visit Diagnosis: Unsteadiness on feet (R26.81);Muscle weakness (generalized) (M62.81);Other (comment) (decreased activity tolerance)                Time: 1610-9604 OT Time Calculation (min): 34 min Charges:  OT General Charges $OT Visit: 1 Visit OT Evaluation $OT Eval Moderate Complexity: 1 Mod OT Treatments $Self Care/Home Management : 8-22 mins  Tyler Deis, OTR/L [facility] Acute Rehabilitation Office: [phone]   [name] [name] 09/17/2023, 1:08 PM

## 2023-09-17 NOTE — Progress Notes (Signed)
 RT obtained ABG. RT called lab to make them aware and sent ABG @1719 

## 2023-09-17 NOTE — TOC Initial Note (Signed)
 Transition of Care [facility]) - Initial/Assessment Note    Patient Details  Name: [name] [name] MRN: [id number] Date of Birth: 09-20-1955  [facility]) CM/SW Contact:    Glennon Mac, RN Phone Number: 09/17/2023, 4:29 PM  Clinical Narrative:                 68 y.o. female presents to [facility] 09/16/23 after falling out of bed with R sided rib fxs 4-9 and R PTX. Prior admit 08/31/23 w/ acute COPD exacerbation and Aspiration PNA.  PTA, pt independent and living at home alone; she uses rollator at times with ambulation.  She is on home oxygen through [employer] at 2L/[place].  She is active with [facility] for HHPT and RN.  Notified Cory with [facility] of patient admission; will need resumption orders for HHPT/RN and addition of HHOT prior to dc with face to face documentation for Medicare.  Patient's PCP is Dr. Melody Comas; [facility] will continue to follow for home needs as patient progresses.   Expected Discharge Plan: Home w Home Health Services Barriers to Discharge: Continued Medical Work up            Expected Discharge Plan and Services     Post Acute Care Choice: Resumption of Svcs/PTA Provider Living arrangements for the past 2 months: Apartment                                      Prior Living Arrangements/Services Living arrangements for the past 2 months: Apartment Lives with:: Self Patient language and need for interpreter reviewed:: Yes Do you feel safe going back to the place where you live?: Yes      Need for Family Participation in Patient Care: Yes (Comment) Care giver support system in place?: Yes (comment) Current home services: DME, Home PT, Home RN Criminal Activity/Legal Involvement Pertinent to Current Situation/Hospitalization: No - Comment as needed  Activities of Daily Living   ADL Screening (condition at time of admission) Independently performs ADLs?: No Does the patient have a NEW difficulty with  bathing/dressing/toileting/self-feeding that is expected to last >3 days?: No (needs assist) Does the patient have a NEW difficulty with getting in/out of bed, walking, or climbing stairs that is expected to last >3 days?: No (need assist) Does the patient have a NEW difficulty with communication that is expected to last >3 days?: No Is the patient deaf or have difficulty hearing?: No Does the patient have difficulty seeing, even when wearing glasses/contacts?: Yes Does the patient have difficulty concentrating, remembering, or making decisions?: No  Permission Sought/Granted                  Emotional Assessment   Attitude/Demeanor/Rapport: Engaged Affect (typically observed): Accepting Orientation: : Oriented to Self, Oriented to Place, Oriented to  Time, Oriented to Situation      Admission diagnosis:  Pneumothorax on right [J93.9] Pneumothorax, right [J93.9] Traumatic pneumothorax, initial encounter [S27.0XXA] Fall, initial encounter [id number]] Closed fracture of multiple ribs of right side, initial encounter [S22.41XA] Patient Active Problem List   Diagnosis Date Noted   Pneumothorax, right 09/16/2023   COPD with acute exacerbation (HCC) 09/01/2023   Chronic hypoxic respiratory failure (HCC) 09/01/2023   Continuous dependence on cigarette smoking 09/01/2023   History of CAD (coronary artery disease) 09/01/2023   CAP (community acquired pneumonia) 09/01/2023   Infection due to parainfluenza virus 4 06/20/2023  Full code status 06/14/2023   Acute hypoxic respiratory failure (HCC) 06/13/2023   Acute exacerbation of chronic obstructive pulmonary disease (COPD) (HCC) 06/12/2023   Nicotine dependence 05/03/2023   Substance abuse (HCC) 09/10/2022   Iron deficiency anemia 11/02/2021   Major depressive disorder, recurrent, in full remission with anxious distress (HCC) 05/31/2021   Chronic neck pain 02/26/2021   GAD (generalized anxiety disorder) 01/08/2021   Esophageal  dysphagia 04/28/2019   Bipolar I disorder (HCC) 09/17/2018   Insomnia 10/28/2017   Prediabetes 05/28/2017   QT prolongation 03/25/2017   Psoriasis 06/22/2015   COPD (chronic obstructive pulmonary disease) (HCC)GOLD E 05/18/2015   Anxiety and depression 07/10/2013   Essential hypertension 06/07/2013   Hyperlipidemia 06/07/2013   Asthma, chronic 06/07/2013   Emphysema lung (HCC) 06/07/2013   Chronic back pain 06/07/2013   CAD S/P percutaneous coronary angioplasty 06/07/2013   GERD (gastroesophageal reflux disease) 06/07/2013   Breast lump on left side at 3 o'clock position 10/13/2012   S/P abdominal hysterectomy and right salpingo-oophorectomy 08/29/2011   COPD exacerbation (HCC) 09/15/2009   TOBACCO ABUSE 07/17/2009   Chronic rhinitis 07/17/2009   Lung nodule < 6cm on CT 07/17/2009   Hypothyroidism 12/22/2008   Chronic depression 12/22/2008   PCP:  Martina Sinner, MD Pharmacy:   [facility] [address], [address] [place] [address] Phone: [phone] Fax: [phone]  [facility] - [place], [place] - [address] [place] [address] Phone: [phone] Fax: [phone]  [name] [address] [place] [address] Phone: [phone] Fax: [phone]     Social Drivers of Health (SDOH) Social History: SDOH Screenings   Food Insecurity: No Food Insecurity (09/16/2023)  Recent Concern: Food Insecurity - Food Insecurity Present (08/07/2023)  Housing: Low Risk  (09/16/2023)  Transportation Needs: No Transportation Needs (09/16/2023)  Utilities: Not At Risk (09/16/2023)  Alcohol Screen: Low Risk  (08/07/2023)  Depression (PHQ2-9): Medium Risk (08/07/2023)  Financial Resource Strain: Medium Risk (08/07/2023)  Physical Activity: Inactive (08/07/2023)  Social Connections: Moderately Isolated (09/16/2023)  Stress: No Stress Concern Present  (08/07/2023)  Tobacco Use: High Risk (09/16/2023)  Health Literacy: Inadequate Health Literacy (08/07/2023)   SDOH Interventions:     Readmission Risk Interventions    06/20/2023    3:17 PM  Readmission Risk Prevention Plan  Transportation Screening Complete  HRI or Home Care Consult Complete  Social Work Consult for Recovery Care Planning/Counseling Complete  Palliative Care Screening Not Applicable  Medication Review (RN Care Manager) Complete   Quintella Baton, RN, BSN  Trauma/Neuro ICU Case Manager [phone]

## 2023-09-18 ENCOUNTER — Inpatient Hospital Stay (HOSPITAL_COMMUNITY)

## 2023-09-18 DIAGNOSIS — Z515 Encounter for palliative care: Secondary | ICD-10-CM | POA: Diagnosis not present

## 2023-09-18 DIAGNOSIS — J9621 Acute and chronic respiratory failure with hypoxia: Secondary | ICD-10-CM | POA: Diagnosis not present

## 2023-09-18 DIAGNOSIS — Z7189 Other specified counseling: Secondary | ICD-10-CM

## 2023-09-18 DIAGNOSIS — J441 Chronic obstructive pulmonary disease with (acute) exacerbation: Secondary | ICD-10-CM | POA: Diagnosis not present

## 2023-09-18 DIAGNOSIS — J449 Chronic obstructive pulmonary disease, unspecified: Secondary | ICD-10-CM

## 2023-09-18 DIAGNOSIS — S270XXA Traumatic pneumothorax, initial encounter: Secondary | ICD-10-CM

## 2023-09-18 DIAGNOSIS — J939 Pneumothorax, unspecified: Secondary | ICD-10-CM

## 2023-09-18 DIAGNOSIS — S2241XA Multiple fractures of ribs, right side, initial encounter for closed fracture: Secondary | ICD-10-CM | POA: Diagnosis not present

## 2023-09-18 DIAGNOSIS — Z66 Do not resuscitate: Secondary | ICD-10-CM

## 2023-09-18 LAB — CBC
HCT: 39.7 % (ref 36.0–46.0)
Hemoglobin: 12.8 g/dL (ref 12.0–15.0)
MCH: 32.1 pg (ref 26.0–34.0)
MCHC: 32.2 g/dL (ref 30.0–36.0)
MCV: 99.5 fL (ref 80.0–100.0)
Platelets: 311 10*3/uL (ref 150–400)
RBC: 3.99 MIL/uL (ref 3.87–5.11)
RDW: 15.7 % — ABNORMAL HIGH (ref 11.5–15.5)
WBC: 20.9 10*3/uL — ABNORMAL HIGH (ref 4.0–10.5)
nRBC: 0 % (ref 0.0–0.2)

## 2023-09-18 LAB — BASIC METABOLIC PANEL
Anion gap: 12 (ref 5–15)
BUN: 22 mg/dL (ref 8–23)
CO2: 23 mmol/L (ref 22–32)
Calcium: 8.4 mg/dL — ABNORMAL LOW (ref 8.9–10.3)
Chloride: 99 mmol/L (ref 98–111)
Creatinine, Ser: 1.29 mg/dL — ABNORMAL HIGH (ref 0.44–1.00)
GFR, Estimated: 45 mL/min — ABNORMAL LOW (ref >60)
Glucose, Bld: 168 mg/dL — ABNORMAL HIGH (ref 70–99)
Potassium: 4.1 mmol/L (ref 3.5–5.1)
Sodium: 134 mmol/L — ABNORMAL LOW (ref 135–145)

## 2023-09-18 LAB — GLUCOSE, CAPILLARY
Glucose-Capillary: 129 mg/dL — ABNORMAL HIGH (ref 70–99)
Glucose-Capillary: 97 mg/dL (ref 70–99)
Glucose-Capillary: 166 mg/dL — ABNORMAL HIGH (ref 70–99)
Glucose-Capillary: 137 mg/dL — ABNORMAL HIGH (ref 70–99)

## 2023-09-18 LAB — EXPECTORATED SPUTUM ASSESSMENT W GRAM STAIN, RFLX TO RESP C

## 2023-09-18 LAB — BLOOD GAS, ARTERIAL
Acid-Base Excess: 1.4 mmol/L (ref 0.0–2.0)
Bicarbonate: 28.1 mmol/L — ABNORMAL HIGH (ref 20.0–28.0)
O2 Saturation: 94 %
Patient temperature: 37.3
pCO2 arterial: 53 mmHg — ABNORMAL HIGH (ref 32–48)
pH, Arterial: 7.34 — ABNORMAL LOW (ref 7.35–7.45)
pO2, Arterial: 70 mmHg — ABNORMAL LOW (ref 83–108)

## 2023-09-18 MED ORDER — BUDESONIDE 0.5 MG/2ML IN SUSP
0.5000 mg | Freq: Two times a day (BID) | RESPIRATORY_TRACT | Status: DC
  Administered 2023-09-18 – 2023-09-25 (×15): 0.5 mg via RESPIRATORY_TRACT
  Filled 2023-09-18 (×15): qty 2

## 2023-09-18 MED ORDER — METHYLPREDNISOLONE SODIUM SUCC 40 MG IJ SOLR
40.0000 mg | INTRAMUSCULAR | Status: DC
  Administered 2023-09-18 – 2023-09-20 (×3): 40 mg via INTRAVENOUS
  Filled 2023-09-18 (×3): qty 1

## 2023-09-18 MED ORDER — ARFORMOTEROL TARTRATE 15 MCG/2ML IN NEBU
15.0000 ug | INHALATION_SOLUTION | Freq: Two times a day (BID) | RESPIRATORY_TRACT | Status: DC
  Administered 2023-09-18 – 2023-09-25 (×15): 15 ug via RESPIRATORY_TRACT
  Filled 2023-09-18 (×15): qty 2

## 2023-09-18 MED ORDER — SODIUM CHLORIDE 0.9 % IV SOLN
2.0000 g | Freq: Two times a day (BID) | INTRAVENOUS | Status: DC
  Administered 2023-09-18 – 2023-09-20 (×6): 2 g via INTRAVENOUS
  Filled 2023-09-18 (×8): qty 12.5

## 2023-09-18 MED ORDER — REVEFENACIN 175 MCG/3ML IN SOLN
175.0000 ug | Freq: Every day | RESPIRATORY_TRACT | Status: DC
  Administered 2023-09-18 – 2023-09-25 (×8): 175 ug via RESPIRATORY_TRACT
  Filled 2023-09-18 (×7): qty 3

## 2023-09-18 MED ORDER — IPRATROPIUM-ALBUTEROL 0.5-2.5 (3) MG/3ML IN SOLN
3.0000 mL | RESPIRATORY_TRACT | Status: DC
  Administered 2023-09-18 – 2023-09-19 (×9): 3 mL via RESPIRATORY_TRACT
  Filled 2023-09-18 (×10): qty 3

## 2023-09-18 NOTE — Progress Notes (Signed)
 Called daughter [name] at number provided by RN.   We discussed my recommendation regarding palliative care consultation. She has noticed that her mother decompensates as soon as she comes off antibiotics & steroids, where she will slowly decline again. She has had OP courses of steroids.   Daughter said she would be fine with BiPAP, but is DNR. She would be ok with short-term MV, but not CPR, no dialysis.  Right now she is listed as full code in her chart - her daughter was not aware of this. I encouraged her to ensure her mother had not changed her wishes and I will reach out to the primary team about this. Paged trauma.   Palliative is consulted, daughter understands the full spectrum of care and is welcoming this in her mother's care.   Steffanie Dunn, DO 09/18/23 12:57 PM [facility]  For contact information, see Amion. If no response to pager, please call PCCM consult pager. After hours, 7PM- 7AM, please call Elink.

## 2023-09-18 NOTE — Progress Notes (Signed)
 Dr. Azucena Cecil was made aware that pt BP is low 96/56; pt desat to 87% during sleep; pt is very coarse, conjested and gurgling. Pt is on 35% HHFNC  at 10 @LPM  Respiratory therapy was also made aware. Dr. Azucena Cecil said to give pt the PRN neb and have the respiratory therapist increase oxygen.

## 2023-09-18 NOTE — Progress Notes (Signed)
  Progress Note   Date: 09/18/2023  Patient Name: [name] [name]        MRN#: [id number]  Review of the patient's clinical findings supports the diagnosis of  Acute Kidney Injury

## 2023-09-18 NOTE — Progress Notes (Addendum)
 Progress Note     Subjective: Pt with hypotension overnight and desats while sleeping.  She is currently on about 13L O2 HFNC.  She denies feeling SOB.  Having abdominal pain as she can't void.  Hasn't been out of bed yesterday.  Ate some yesterday, but not much appetite.  BP slowly improving this morning.  Objective: Vital signs in last 24 hours: Temp:  [97.9 F (36.6 C)-100.6 F (38.1 C)] 98.3 F (36.8 C) (03/06 0359) Pulse Rate:  [62-94] 70 (03/06 0728) Resp:  [17-21] 20 (03/06 0728) BP: (78-154)/(46-81) 102/55 (03/06 0728) SpO2:  [90 %-99 %] 92 % (03/06 0728) FiO2 (%):  [40 %-45 %] 45 % (03/06 0115) Weight:  [55.5 kg] 55.5 kg (03/06 0338) Last BM Date : 09/16/23  Intake/Output from previous day: 03/05 0701 - 03/06 0700 In: 970.7 [P.O.:480; I.V.:390.7; IV Piggyback:100] Out: 60 [Chest Tube:60] Intake/Output this shift: No intake/output data recorded.  PE: General: pleasant, WD, chronically ill appearing female, NAD Heart: regular, rate, and rhythm.   Lungs: wheezing and significant wet/rhonchus BS bilaterally.  CT in place.  + airleak which is persistent since placement.  Turned up to -40 myself. Abd: soft, mild lower distention and tender to palpation over her bladder (unable to void currently)  Psych: A&Ox3 with an appropriate affect.    Lab Results:  Recent Labs    09/17/23 0243 09/18/23 0324  WBC 12.9* 20.9*  HGB 13.2 12.8  HCT 41.1 39.7  PLT 313 311   BMET Recent Labs    09/17/23 0243 09/18/23 0324  NA 136 134*  K 4.5 4.1  CL 107 99  CO2 23 23  GLUCOSE 129* 168*  BUN 11 22  CREATININE 0.79 1.29*  CALCIUM 8.4* 8.4*   PT/INR No results for input(s): "LABPROT", "INR" in the last 72 hours. CMP     Component Value Date/Time   NA 134 (L) 09/18/2023 0324   NA 142 06/26/2023 1339   K 4.1 09/18/2023 0324   CL 99 09/18/2023 0324   CO2 23 09/18/2023 0324   GLUCOSE 168 (H) 09/18/2023 0324   BUN 22 09/18/2023 0324   BUN 14 06/26/2023 1339    CREATININE 1.29 (H) 09/18/2023 0324   CREATININE 0.80 03/24/2023 1232   CREATININE 0.93 04/16/2016 0937   CALCIUM 8.4 (L) 09/18/2023 0324   PROT 5.9 (L) 09/01/2023 0527   PROT 6.2 06/26/2023 1339   ALBUMIN 3.0 (L) 09/01/2023 0527   ALBUMIN 4.0 06/26/2023 1339   AST 16 09/01/2023 0527   AST 19 03/24/2023 1232   ALT 18 09/01/2023 0527   ALT 23 03/24/2023 1232   ALKPHOS 60 09/01/2023 0527   BILITOT 0.7 09/01/2023 0527   BILITOT 0.4 06/26/2023 1339   BILITOT 0.4 03/24/2023 1232   GFRNONAA 45 (L) 09/18/2023 0324   GFRNONAA >60 03/24/2023 1232   GFRNONAA 67 04/16/2016 0937   GFRAA 77 04/05/2019 0926   GFRAA 78 04/16/2016 0937   Lipase     Component Value Date/Time   LIPASE 23 02/11/2021 0416       Studies/Results: DG Chest Port 1 View Result Date: 09/17/2023 CLINICAL DATA:  [id number]. Acute respiratory distress. Right pneumothorax and rib fractures. EXAM: PORTABLE CHEST 1 VIEW COMPARISON:  Chest, abdomen and pelvis CT with contrast yesterday at 10:43 a.m., portable chest today at 6:08 a.m. FINDINGS: 4:59 p.m. Right chest drainage catheter with the pigtail superimposing in the mid perihilar area is similar in positioning. Increased right apical pneumothorax is seen today with 2 cm  pleural-parenchymal separation and a small subpulmonic pneumothorax component new in the interval. The pneumothorax is approximately 10% of the chest volume previously 5%. There is no appreciable mediastinal shift. Small right pleural effusion continues to be noted. There is patchy increased right chest wall emphysema. No left pneumothorax. Atelectatic bands in both bases with no focal infiltrate being seen. The mediastinum is stable with aortic tortuosity and calcification. The cardiac size is normal. Right lateral fourth through ninth rib fractures are again noted, better demonstrated with CT. IMPRESSION: 1. Increased right apical pneumothorax with 2 cm pleural-parenchymal separation and a small subpulmonic  pneumothorax component new in the interval. The pneumothorax is approximately 10% of the chest volume previously 5%. 2. Right chest drainage catheter similar in positioning. 3. Small right pleural effusion. 4. Patchy increased right chest wall emphysema. 5. Right lateral fourth through ninth rib fractures. Electronically Signed   By: Almira Bar M.D.   On: 09/17/2023 20:10   DG Chest Port 1 View Result Date: 09/17/2023 CLINICAL DATA:  68 year old female with right side chest tube, pneumothorax, fall with numerous right side rib fractures. EXAM: PORTABLE CHEST 1 VIEW COMPARISON:  Chest CT yesterday. FINDINGS: Portable AP semi upright view at 0608 hours. Pigtail right chest tube in place. Moderate right chest wall and supraclavicular subcutaneous gas persists. Decreased right pneumothorax, trace pleural edge visible inside the right lateral 3rd rib. Mildly lower lung volumes. Stable cardiac size and mediastinal contours. Probable left lung base atelectasis. No pulmonary edema. Negative visible bowel gas. Right lateral rib fractures better demonstrated by CT. IMPRESSION: 1. Right chest tube with trace residual right pneumothorax. Persistent right chest wall gas. 2. Lower lung volumes with increased left lung base opacity, probably atelectasis. Electronically Signed   By: Odessa Fleming M.D.   On: 09/17/2023 07:12   DG Chest Portable 1 View Result Date: 09/16/2023 CLINICAL DATA:  Rib fractures, pneumothorax, post chest tube placement EXAM: PORTABLE CHEST - 1 VIEW COMPARISON:  Earlier film of the same day FINDINGS: Right pigtail chest tube placement, directed towards the hilum. Small residual apical pneumothorax. Increase in right lateral subcutaneous emphysema. New patchy right perihilar airspace opacities. Left lung clear. Heart size and mediastinal contours are within normal limits. Aortic Atherosclerosis (ICD10-170.0). No effusion. Right rib fracture deformities laterally as before. IMPRESSION: 1. Right chest tube  with small residual apical pneumothorax. 2. New right perihilar airspace disease. Electronically Signed   By: Corlis Leak M.D.   On: 09/16/2023 16:03   CT Head Wo Contrast Result Date: 09/16/2023 CLINICAL DATA:  Trauma, ground level fall onto right side last night, chest and arm pain. EXAM: CT HEAD WITHOUT CONTRAST CT CERVICAL SPINE WITHOUT CONTRAST TECHNIQUE: Multidetector CT imaging of the head and cervical spine was performed following the standard protocol without intravenous contrast. Multiplanar CT image reconstructions of the cervical spine were also generated. RADIATION DOSE REDUCTION: This exam was performed according to the departmental dose-optimization program which includes automated exposure control, adjustment of the mA and/or kV according to patient size and/or use of iterative reconstruction technique. COMPARISON:  MRI cervical spine 03/16/2021, CT head 04/16/2012 FINDINGS: CT HEAD FINDINGS Brain: No acute intracranial hemorrhage. No CT evidence of acute infarct. Nonspecific hypoattenuation in the periventricular and subcortical white matter favored to reflect chronic microvascular ischemic changes. 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 No edema, mass effect, or midline shift. The basilar cisterns are patent. Ventricles: Prominence of the ventricles suggesting underlying parenchymal volume loss. Vascular: Atherosclerotic calcifications of the carotid siphons. No hyperdense  vessel. Skull: No acute or aggressive finding. Orbits: Left lens replacement.  Orbits otherwise symmetric. Sinuses: Mild mucosal thickening in the ethmoid sinuses. Small osteoma in the right ethmoid sinus. Concha bullosa on the right with leftward nasal septal deviation. Other: Mastoid air cells are clear. CT CERVICAL SPINE FINDINGS Alignment: Straightening of the normal cervical lordosis. No listhesis. No facet subluxation or dislocation. Skull base and vertebrae: No acute fracture or compression fracture in the  cervical spine. Degenerative endplate changes most pronounced on the right posteriorly at C5-6 and C6-7. No suspicious osseous lesion. Soft tissues and spinal canal: No prevertebral fluid or swelling. No visible canal hematoma. Elongation of the bilateral styloid processes with calcification of the stylohyoid ligaments. Disc levels: Mild disc space narrowing at C5-6 and C6-7. Central disc protrusion at C4-5 resulting in at least mild spinal canal stenosis. Additional disc osteophyte complexes at C5-6 and C6-7 resulting in mild spinal canal stenosis. Facet arthrosis most pronounced on the right at C5-6 and C6-7. Foraminal narrowing greatest on the right at C5-6 and C6-7. Upper chest: Partially visualized right pneumothorax. Gas noted along the right anterolateral chest wall. Emphysema in the lung apices. Other: None. IMPRESSION: CT HEAD: 1. No acute intracranial abnormality. 2. Moderate chronic microvascular ischemic changes and mild parenchymal volume loss. 3. 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 CT CERVICAL SPINE: 1. No acute fracture or traumatic malalignment of the cervical spine. 2. Partially visualized right pneumothorax. 3. Multilevel degenerative changes of the cervical spine, as described above. 4. Elongation of the bilateral styloid processes with calcification of the stylohyoid ligaments. Correlate for symptoms of Eagle syndrome. Electronically Signed   By: Emily Filbert M.D.   On: 09/16/2023 12:46   CT Cervical Spine Wo Contrast Result Date: 09/16/2023 CLINICAL DATA:  Trauma, ground level fall onto right side last night, chest and arm pain. EXAM: CT HEAD WITHOUT CONTRAST CT CERVICAL SPINE WITHOUT CONTRAST TECHNIQUE: Multidetector CT imaging of the head and cervical spine was performed following the standard protocol without intravenous contrast. Multiplanar CT image reconstructions of the cervical spine were also generated. RADIATION DOSE REDUCTION: This exam was performed according to the  departmental dose-optimization program which includes automated exposure control, adjustment of the mA and/or kV according to patient size and/or use of iterative reconstruction technique. COMPARISON:  MRI cervical spine 03/16/2021, CT head 04/16/2012 FINDINGS: CT HEAD FINDINGS Brain: No acute intracranial hemorrhage. No CT evidence of acute infarct. Nonspecific hypoattenuation in the periventricular and subcortical white matter favored to reflect chronic microvascular ischemic changes. 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 No edema, mass effect, or midline shift. The basilar cisterns are patent. Ventricles: Prominence of the ventricles suggesting underlying parenchymal volume loss. Vascular: Atherosclerotic calcifications of the carotid siphons. No hyperdense vessel. Skull: No acute or aggressive finding. Orbits: Left lens replacement.  Orbits otherwise symmetric. Sinuses: Mild mucosal thickening in the ethmoid sinuses. Small osteoma in the right ethmoid sinus. Concha bullosa on the right with leftward nasal septal deviation. Other: Mastoid air cells are clear. CT CERVICAL SPINE FINDINGS Alignment: Straightening of the normal cervical lordosis. No listhesis. No facet subluxation or dislocation. Skull base and vertebrae: No acute fracture or compression fracture in the cervical spine. Degenerative endplate changes most pronounced on the right posteriorly at C5-6 and C6-7. No suspicious osseous lesion. Soft tissues and spinal canal: No prevertebral fluid or swelling. No visible canal hematoma. Elongation of the bilateral styloid processes with calcification of the stylohyoid ligaments. Disc levels: Mild disc space narrowing at C5-6 and  C6-7. Central disc protrusion at C4-5 resulting in at least mild spinal canal stenosis. Additional disc osteophyte complexes at C5-6 and C6-7 resulting in mild spinal canal stenosis. Facet arthrosis most pronounced on the right at C5-6 and C6-7. Foraminal narrowing greatest  on the right at C5-6 and C6-7. Upper chest: Partially visualized right pneumothorax. Gas noted along the right anterolateral chest wall. Emphysema in the lung apices. Other: None. IMPRESSION: CT HEAD: 1. No acute intracranial abnormality. 2. Moderate chronic microvascular ischemic changes and mild parenchymal volume loss. 3. 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 CT CERVICAL SPINE: 1. No acute fracture or traumatic malalignment of the cervical spine. 2. Partially visualized right pneumothorax. 3. Multilevel degenerative changes of the cervical spine, as described above. 4. Elongation of the bilateral styloid processes with calcification of the stylohyoid ligaments. Correlate for symptoms of Eagle syndrome. Electronically Signed   By: Emily Filbert M.D.   On: 09/16/2023 12:46   CT CHEST ABDOMEN PELVIS W CONTRAST Result Date: 09/16/2023 CLINICAL DATA:  Right-sided chest wall, arm and hip pain after ground level fall. EXAM: CT CHEST, ABDOMEN, AND PELVIS WITH CONTRAST TECHNIQUE: Multidetector CT imaging of the chest, abdomen and pelvis was performed following the standard protocol during bolus administration of intravenous contrast. RADIATION DOSE REDUCTION: This exam was performed according to the departmental dose-optimization program which includes automated exposure control, adjustment of the mA and/or kV according to patient size and/or use of iterative reconstruction technique. CONTRAST:  75mL OMNIPAQUE IOHEXOL 350 MG/ML SOLN COMPARISON:  X-ray earlier 09/16/2023 of the chest. Screening chest CT 02/07/2023. Abdomen and pelvis CT without contrast 12/16/2020. FINDINGS: CT CHEST FINDINGS Cardiovascular: Heart is nonenlarged. Trace pericardial fluid. Coronary artery calcifications are seen. The thoracic aorta is normal course and caliber with mild atherosclerotic calcified plaque. Plaque also seen along the origin of the great vessels. Mediastinum/Nodes: Normal caliber thoracic esophagus. Atrophic thyroid  gland. No specific abnormal lymph node enlargement identified in the axillary regions, hilum or mediastinum. Lungs/Pleura: Small right-sided pneumothorax particularly anterior and towards the right lung apex. Some medial areas as well. No left-sided pneumothorax. Bandlike opacity along the lung bases, right-greater-than-left. Atelectasis is favored. Is also some presumed mucous plugging along the right lower lobe. Trace right-sided pleural fluid with possible component hematoma. Underlying centrilobular emphysematous lung changes. There also several scattered noncalcified small lung nodules identified. These include 3 mm left lung series 5, image 53 posteriorly. There are several previously seen which are similar today. There is also a new focus such as posterior left upper lobe series 5, image 46 measuring 4 mm. Recommend continued surveillance in 3 months. Musculoskeletal: Scattered moderate degenerative changes along the spine. Right-sided chest wall gas identified. Numerous right-sided rib fractures identified. This includes the fourth rib, fifth, sixth, seventh, eighth, ninth ribs. CT ABDOMEN PELVIS FINDINGS Hepatobiliary: No focal liver abnormality is seen. No gallstones, gallbladder wall thickening, or biliary dilatation. Patent portal vein Pancreas: Scattered atrophy of the pancreas. Spleen: Normal in size without focal abnormality. Adrenals/Urinary Tract: The right adrenal gland is minimally thickened. There is a left adrenal nodule specifically seen which previously had Hounsfield unit of less than 0 consistent with an adenoma. No enhancing renal mass or collecting system dilatation. The ureters have normal course and caliber extending down to the urinary bladder. Preserved contour to the urinary bladder. Stomach/Bowel: The stomach and small bowel are nondilated. Large bowel has a normal course and caliber with scattered colonic stool. Sigmoid colon diverticula. There is some minimal stranding seen  adjacent to  the sigmoid colon laterally such as axial series 3, image 96. Please correlate with any symptoms. Vascular/Lymphatic: Aortic atherosclerosis. No enlarged abdominal or pelvic lymph nodes. Reproductive: Status post hysterectomy. No adnexal masses. Other: No free intra-air or free fluid. Gas seen tracking along the right side of the anterior abdominal wall. Musculoskeletal: Scattered degenerative changes of the spine and pelvis. Of the there is streak artifact as the patient's arms were scanned at the patient's side. Critical Value/emergent results were called by telephone at the time of interpretation on 09/16/2023 at 11:15am to provider AMJAD ALI , who verbally acknowledged these results. IMPRESSION: Numerous right-sided rib fractures identified extending from the four hundred thirty-ninth ribs. Some of these are displaced. There is significant chest wall gas as well as a small to moderate right-sided pneumothorax. Dependent atelectasis. Trace high density fluid along the right pleural space or extrapleural region. Multiple lung nodules identified. There is at least 1 which may be new from the prior examination but still under 5 mm. Recommend follow up in 3 months. No bowel obstruction, free air or free fluid. No evidence of solid organ injury. Sigmoid colon diverticula. There is slight stranding in this location. Please correlate for any clinical symptoms of a diverticulitis. Stable left adrenal nodules consistent with adenomas. Electronically Signed   By: Karen Kays M.D.   On: 09/16/2023 11:23   DG Chest Portable 1 View Result Date: 09/16/2023 CLINICAL DATA:  Pain after fall EXAM: PORTABLE CHEST 1 VIEW COMPARISON:  X-ray 08/31/2023 FINDINGS: Right lateral chest wall gas identified with a small right apical pneumothorax. Bandlike opacity seen along the right lung base greater than left. Atelectasis favored over infiltrate but recommend follow-up. No edema. No effusion. Normal cardiopericardial  silhouette. Calcified aorta. Overlapping cardiac leads. Old left clavicle fracture. IMPRESSION: Small right apical pneumothorax.  Mild lung base opacity. Chest wall gas. Please see separate CT scan of the chest, abdomen and pelvis Electronically Signed   By: Karen Kays M.D.   On: 09/16/2023 11:09    Anti-infectives: Anti-infectives (From admission, onward)    Start     Dose/Rate Route Frequency Ordered Stop   09/18/23 0430  ceFEPIme (MAXIPIME) 2 g in sodium chloride 0.9 % 100 mL IVPB        2 g 200 mL/hr over 30 Minutes Intravenous Every 12 hours 09/18/23 0424          Assessment/Plan  Fall out of bed R rib fractures 4-9 - pulm toilet, IS, pain control, mobilization R PTX - pigtail placed in trauma bay.  Follow up CXR this AM lwith worsening PTX.  Turn up to -40 and recheck CXR at noon and in am.  If this isn't improving, may need to consider possible upsizing at some point.  Has persistent airleak since time of placement. Acute on Chronic respiratory failure on home O2/COPD/ possible PNA - continue on HFNC for now.  D/w pulm this morning who will see her.  I have made adjustments to her inhalers (stopped these) and added 3 different nebulizers in speak with Dr. Francine Graven who is her primary pulmonologist.  Appreciate their help.  ABG ordered.  Cefepime started overnight due to possible PNA.  WBC up to 21K today.  Will monitor this.  D/w patient regarding possible need for intubation/trach etc.  She said if she can resume a normal life, she is ok with these things, but if she is at a point medically where she will have no quality of life, she does not want life-saving  measures. CAD/MI - continue Brilinta DM - SSI HTN - home meds HLD Depression/Anxiety Hypothyroidism - home meds Acute urinary retention - place foley today in setting of lasix use Hypotension - could be secondary to some lasix from yesterday vs developing PNA.  Slowly improving.  Cont to monitor FEN - carb mod diet VTE -  Brilinta/Lovenox ID - Cefepime  DISPO - 4E, pulm consult, palliative consult, repeat CXR at noon, tenuous respiratory status  LOS: 2 days   I reviewed last 24 h vitals and pain scores, last 48 h intake and output, last 24 h labs and trends, and last 24 h imaging results.   Letha Cape, [facility] 09/18/2023, 8:14 AM Please see Amion for pager number during day hours 7:00am-4:30pm

## 2023-09-18 NOTE — Progress Notes (Signed)
 Pharmacy Antibiotic Note  [name] [name] is a 68 y.o. female admitted on 09/16/2023 with pneumonia.  Pharmacy has been consulted for Cefepime dosing. WBC is increasing. Renal function age appropriate.   Plan: Cefepime 2g IV q12h Trend WBC, temp, renal function  F/U infectious work-up   Height: 4\' 9"  (144.8 cm) Weight: 55.5 kg (122 lb 5.7 oz) IBW/kg (Calculated) : 38.6  Temp (24hrs), Avg:98.6 F (37 C), Min:97.8 F (36.6 C), Max:100.6 F (38.1 C)  Recent Labs  Lab 09/16/23 0900 09/16/23 0917 09/17/23 0243 09/18/23 0324  WBC 16.7*  --  12.9* 20.9*  CREATININE 0.95 1.00 0.79  --     Estimated Creatinine Clearance: 48.9 mL/min (by C-G formula based on SCr of 0.79 mg/dL).    Allergies  Allergen Reactions   Avocado Anaphylaxis   Latex Shortness Of Breath and Rash   Codeine Nausea Only   Abran Duke, PharmD, BCPS Clinical Pharmacist Phone: [phone]

## 2023-09-18 NOTE — Consult Note (Signed)
 Consultation Note Date: 09/18/2023   Patient Name: [name] [name]  DOB: July 25, 1955  MRN: [id number]  Age / Sex: 68 y.o., female  PCP: Martina Sinner, MD Referring Physician: Md, Trauma, MD  Reason for Consultation: Establishing goals of care  HPI/Patient Profile: 68 y.o. female  with past medical history of chronic respiratory failure on 2L O2 [place] at home, COPD with recent exacerbation/multiple hospitalizations, HTN, HLD, CAD/MI with stents on plavix and ASA, DM, depression/anxiety, and hypothyroidism  admitted on 09/16/2023 with right-sided rib fractures and a R PTX. Chest tube placed. Also treating for possible pneumonia. PMT consulted to discuss GOC/  Clinical Assessment and Goals of Care: I have reviewed medical records including EPIC notes, labs and imaging, assessed the patient and then met with patient and her daughter [name] [name]  to discuss diagnosis prognosis, GOC, EOL wishes, disposition and options.  I introduced Palliative Medicine as specialized medical care for people living with serious illness. It focuses on providing relief from the symptoms and stress of a serious illness. The goal is to improve quality of life for both the patient and the family.  Patient and daughter tell me that patient has been living independently but has had a complicated course since about Thanksgiving of 2024.  Since that time she has required almost monthly visits to the hospital.  She was just discharged at the end of February.  They tell me at baseline she wears 2 L of oxygen.  She is ambulatory without assist but will occasionally use a walker for long distances.  She has maintained an adequate appetite.   We discussed patient's current illness and what it means in the larger context of patient's on-going co-morbidities.  Natural disease trajectory and expectations at EOL were discussed.  We discussed her lung disease and heart disease.  We discussed chronic  nature of these diseases.  I attempted to elicit values and goals of care important to the patient.    Patient and her daughter had recently completed advanced directives forms and so we reviewed these.  Patient has been clear she would want her daughter to service decision maker if she were unable.  Patient is clear that what is most important to her is not being a burden on her family, maintaining independence, returning to her home.  We discussed CODE STATUS at length.  Patient initially uncertain but with ongoing conversation and focusing on what is most important to her she shares she does not think she would want to go through something like CPR or mechanical ventilation as we all agree this would likely not lead to what she considers good quality of life.  She is also clear she would never want something like hemodialysis or feeding tubes.  She does agree to continue current care including high flow nasal cannula and would be open to BiPAP if needed.  Daughter agrees to all of the above.  Discussed with patient the importance of continued conversation with family and the medical providers regarding overall plan of care and treatment options, ensuring decisions are within the context of the patient's values and GOCs.    We discussed continuing ongoing goals of care conversations as we move through hospitalization.  Patient and daughter are agreeable.  Questions and concerns were addressed. The family was encouraged to call with questions or concerns.    Primary Decision Maker PATIENT  Daughter [name] [name] if patient unable  SUMMARY OF RECOMMENDATIONS   - DNR/DNI established - daughter as surrogate decision maker if patient  unable - patients main goal is to maintain as much independence as possible - would not consider good quality of life if independence is lost and so would not want medical procedures that prolonged this condition - okay with current measures - continuing HFNC, open  to bipap if needed - would never want HD or feeding tubes  Code Status/Advance Care Planning: DNR  Symptom Management:  Continue pain management as prescribed for rib pain       Primary Diagnoses: Present on Admission:  Pneumothorax, right   I have reviewed the medical record, interviewed the patient and family, and examined the patient. The following aspects are pertinent.  Past Medical History:  Diagnosis Date   Allergy    seasonal   Anemia    Anxiety    takes Lexapro daily   Arthritis    "back, from neck down pass my bra area" (03/25/2017)   Asthma    Bartholin gland cyst 08/29/2011   Bruises easily    pt is on Effient   Chronic back pain    herniated nucleus pulposus   Chronic back pain    "neck to bra area; lower back" (03/25/2017)   Chronic kidney disease    recurrent UTI's this year 2022   COPD (chronic obstructive pulmonary disease) (HCC)    early stages   Coronary artery disease    Depression    takes Klonopin daily   Diabetes mellitus without complication (HCC)    Diverticulosis    Fibroadenoma of left breast    GERD (gastroesophageal reflux disease)    takes Nexium daily   H/O hiatal hernia    Heart attack (HCC) 2011   Hemorrhoids    Hernia    Hyperlipidemia    takes Lipitor daily   Hypertension    takes Losartan daily and Labetalol bid   Hypothyroidism    takes Synthroid daily   Insomnia    hydroxyzine prn   Joint pain    Pneumonia    "couple times" (03/25/2017)   Pre-diabetes    "just found out 1 wk ago" (03/25/2017)   Psoriasis    elbows,knees,back   Shortness of breath    with exertion   Slowing of urinary stream    Stress incontinence    Social History   Socioeconomic History   Marital status: Divorced    Spouse name: Not on file   Number of children: 1   Years of education: Not on file   Highest education level: 9th grade  Occupational History   Not on file  Tobacco Use   Smoking status: Every Day    Current packs/day:  0.50    Average packs/day: 0.5 packs/day for 50.0 years (25.0 ttl pk-yrs)    Types: Cigarettes   Smokeless tobacco: Never  Vaping Use   Vaping status: Some Days   Substances: Nicotine  Substance and Sexual Activity   Alcohol use: Yes    Comment: once a week   Drug use: Yes    Types: Marijuana    Comment: history of cocaine use, been about a year, per patient (12/18/21)   Sexual activity: Not Currently    Birth control/protection: Surgical  Other Topics Concern   Not on file  Social History Narrative   Not on file   Social Drivers of Health   Financial Resource Strain: Medium Risk (08/07/2023)   Overall Financial Resource Strain (CARDIA)    Difficulty of Paying Living Expenses: Somewhat hard  Food Insecurity: No Food Insecurity (09/16/2023)  Hunger Vital Sign    Worried About Running Out of Food in the Last Year: Never true    Ran Out of Food in the Last Year: Never true  Recent Concern: Food Insecurity - Food Insecurity Present (08/07/2023)   Hunger Vital Sign    Worried About Running Out of Food in the Last Year: Sometimes true    Ran Out of Food in the Last Year: Sometimes true  Transportation Needs: No Transportation Needs (09/16/2023)   PRAPARE - Administrator, Civil Service (Medical): No    Lack of Transportation (Non-Medical): No  Physical Activity: Inactive (08/07/2023)   Exercise Vital Sign    Days of Exercise per Week: 0 days    Minutes of Exercise per Session: 0 min  Stress: No Stress Concern Present (08/07/2023)   [employer] of Occupational Health - Occupational Stress Questionnaire    Feeling of Stress : Only a little  Social Connections: Moderately Isolated (09/16/2023)   Social Connection and Isolation Panel [NHANES]    Frequency of Communication with Friends and Family: More than three times a week    Frequency of Social Gatherings with Friends and Family: Twice a week    Attends Religious Services: 1 to 4 times per year    Active Member of  [employer] or Organizations: No    Attends Engineer, structural: Never    Marital Status: Divorced   Family History  Problem Relation Age of Onset   Heart disease Mother    Hypertension Mother    Stroke Mother    Mental illness Mother    Heart disease Father    Hypertension Father    Diabetes Father    Colon polyps Brother    Heart disease Maternal Grandfather    Cancer Paternal Grandmother        [place]   Anesthesia problems Daughter    Breast cancer Maternal Aunt    Throat cancer Maternal Uncle    Thyroid disease Paternal Aunt    Hypotension Neg Hx    Malignant hyperthermia Neg Hx    Pseudochol deficiency Neg Hx    Colon cancer Neg Hx    Stomach cancer Neg Hx    Esophageal cancer Neg Hx    Pancreatic cancer Neg Hx    Rectal cancer Neg Hx    Scheduled Meds:  acetaminophen  1,000 mg Oral Q6H   amLODipine  5 mg Oral Daily   arformoterol  15 mcg Nebulization BID   aspirin EC  81 mg Oral Daily   atorvastatin  80 mg Oral Daily   budesonide (PULMICORT) nebulizer solution  0.5 mg Nebulization BID   busPIRone  30 mg Oral q AM   And   busPIRone  15 mg Oral Q1200   And   busPIRone  15 mg Oral QPM   clonazePAM  0.5 mg Oral BID   docusate sodium  100 mg Oral BID   doxepin  75 mg Oral QHS   enoxaparin (LOVENOX) injection  30 mg Subcutaneous Q12H   escitalopram  20 mg Oral Daily   ezetimibe  10 mg Oral QHS   furosemide  20 mg Oral Daily   gabapentin  600 mg Oral BID   guaiFENesin  600 mg Oral BID   insulin aspart  0-15 Units Subcutaneous TID WC   insulin aspart  0-5 Units Subcutaneous QHS   ipratropium-albuterol  3 mL Nebulization Q4H   levothyroxine  112 mcg Oral Daily   lidocaine  1 patch  Transdermal Q24H   losartan  75 mg Oral Daily   methocarbamol  1,000 mg Oral Q8H   methylPREDNISolone (SOLU-MEDROL) injection  40 mg Intravenous Q24H   mirtazapine  45 mg Oral QHS   montelukast  10 mg Oral QHS   nebivolol  10 mg Oral QHS   nicotine  21 mg Transdermal Daily    revefenacin  175 mcg Nebulization Daily   ticagrelor  60 mg Oral BID   Continuous Infusions:  ceFEPime (MAXIPIME) IV 2 g (09/18/23 0514)   PRN Meds:.albuterol, hydrALAZINE, HYDROmorphone (DILAUDID) injection, metoprolol tartrate, oxyCODONE, polyethylene glycol, prochlorperazine Allergies  Allergen Reactions   Avocado Anaphylaxis   Latex Shortness Of Breath and Rash   Codeine Nausea Only   Review of Systems  Constitutional:  Positive for activity change and fatigue.  Respiratory:  Positive for cough. Negative for shortness of breath.   Musculoskeletal:        Rib pain  Neurological:  Positive for weakness.    Physical Exam Constitutional:      General: She is not in acute distress.    Appearance: She is ill-appearing.  Pulmonary:     Effort: Pulmonary effort is normal.     Comments: Remains on HHFNC Skin:    General: Skin is warm and dry.  Neurological:     Mental Status: She is alert and oriented to person, place, and time.  Psychiatric:        Mood and Affect: Mood normal.        Behavior: Behavior normal.     Vital Signs: BP (!) 120/57 (BP Location: Left Arm)   Pulse 86   Temp 99.1 F (37.3 C) (Oral)   Resp 18   Ht 4\' 9"  (1.448 m)   Wt 55.5 kg   SpO2 94%   BMI 26.48 kg/m  Pain Scale: 0-10   Pain Score: 5    SpO2: SpO2: 94 % O2 Device:SpO2: 94 % O2 Flow Rate: .O2 Flow Rate (L/min): 30 L/min (weaned to 30 LPM)  IO: Intake/output summary:  Intake/Output Summary (Last 24 hours) at 09/18/2023 1354 Last data filed at 09/18/2023 1349 Gross per 24 hour  Intake 490.67 ml  Output 1585 ml  Net -1094.33 ml    LBM: Last BM Date : 09/16/23 Baseline Weight: Weight: 69.9 kg Most recent weight: Weight: 55.5 kg     Palliative Assessment/Data:PPS 40%     *Please note that this is a verbal dictation therefore any spelling or grammatical errors are due to the "[employer] One" system interpretation.   Time Total: 100 minutes Time spent includes: Detailed  review of medical records (labs, imaging, vital signs), medically appropriate exam, discussion with treatment team, counseling and educating patient, family and/or staff, documenting clinical information, medication management and coordination of care.    Gerlean Ren, DNP, AGNP-C Palliative Medicine Team [phone] Pager: [phone]

## 2023-09-18 NOTE — Progress Notes (Signed)
 Error, wrong patient

## 2023-09-18 NOTE — Progress Notes (Addendum)
 Dr Janee Morn was made aware that is Drowsy. BP=102/55 MAP=66. 0100 hrs pt had 197 ml of urine in bladder, and at 0630 pt has urine in bladder. Pt tried to void but could not. Dr. Janee Morn is on his way up to evaluate pt.

## 2023-09-18 NOTE — Plan of Care (Signed)
 Palliative:  Full note to follow.  GOC discussion with patient and daughter present. Patient agrees to continue current care plan, continue HFNC, open to bipap if needed but would not want intubation or CPR. Code status changed to DNR/DNI.  Gerlean Ren, DNP, AGNP-C Palliative Medicine Team Team Phone # [phone]  Pager # [phone]

## 2023-09-18 NOTE — Progress Notes (Signed)
 Pt pulled HHF canula out again. CN had fixed and upon entering room noted to be out of her nose. Fixed strap and pt stated she needed a smaller one because it kept coming off. Pt felt was comfortable at this time. Pt resting with call bell within reach.  Will continue to monitor.

## 2023-09-18 NOTE — Plan of Care (Signed)
  Problem: Education: Goal: Ability to describe self-care measures that may prevent or decrease complications (Diabetes Survival Skills Education) will improve Outcome: Not Progressing, continues to remove oxygen frequently Problem: Coping: Goal: Ability to adjust to condition or change in health will improve Outcome: Progressing

## 2023-09-18 NOTE — Consult Note (Signed)
 NAME:  [name] [name], MRN:  [id number], DOB:  1955/09/23, LOS: 2 ADMISSION DATE:  09/16/2023, CONSULTATION DATE:  3/6 REFERRING MD:  trauma service, CHIEF COMPLAINT:  hypoxia   History of Present Illness:  MS. [name] [name] is a 68 y/o woman with a history of COPD and chronic respiratory failure on 3L admitted on 3/4 after a fall out of bed causing right-sided rib fractures and R pneumothorax. In the last day she has had increasing oxygen requirements. She recently was admitted from 2/17- 2/21 for COPD AE and pneumonia. She has had 4 admissions for COPD since early December 2024. Since admission she has had escalating oxygen requirements. She was started on cefepime yesterday. She has had soft blood pressures. Since admission she has been on pulmicort, yupelri, brovana, prednisone 5mg  daily, and montelukast. PTA on trelegy, prednisone. Her main complaint is her pain from rib fractures.   Pertinent  Medical History  COPD Chronic respiratory failure Tobacco abuse Hypothyroidism HTN CAD w/ MI Pre-DM GERD Anxiety & depression  Significant Hospital Events: Including procedures, antibiotic start and stop dates in addition to other pertinent events     Interim History / Subjective:    Objective   Blood pressure (!) 102/55, pulse 70, temperature 98.3 F (36.8 C), temperature source Oral, resp. rate 20, height 4\' 9"  (1.448 m), weight 55.5 kg, SpO2 92%.    FiO2 (%):  [40 %-45 %] 45 %   Intake/Output Summary (Last 24 hours) at 09/18/2023 0816 Last data filed at 09/18/2023 0514 Gross per 24 hour  Intake 730.67 ml  Output 60 ml  Net 670.67 ml   Filed Weights   09/16/23 0903 09/17/23 0523 09/18/23 0338  Weight: 69.9 kg 68.4 kg 55.5 kg    Examination: General: chronically ill, frail appearing elderly woman lying in bed in NAD HENT: [place]/AT, eyes anicteric Lungs: wheezing on the left, rhonchi on the R, weak cough. No conversational dyspnea. Breathing comfortably on HHFNC. Intermittent air leak from  chest tube.  Cardiovascular: S1S2, rRR Abdomen: soft, NT Extremities: no significant edema Neuro: drowsy, answers questions and moves extremities Derm: some bruising, warm  CXR personally reviewed> R pigtail catheter, RLL infiltrate. Persistent apical pneumothorax on the R.  BUN 22 Cr 1.29  WBC 20.9  Resolved Hospital Problem list     Assessment & Plan:   Fall causing rib fractures RLL pneumonia, presumed aspiration Acute on chronic respiratory failure with hypoxia  R traumatic pneumothorax COPD with acute exacerbation H/o tobacco use, ongoing -agree with triple inhaled therapy -can increase prednisone to 40mg  daily, but may delay fracture healing -pain control per primary -adding metanebs q4h + duonebs for improved pulmonary clearance -REcommend against BiPAP unless she needs it for WOB; it is not contraindicated with pneumothorax since she has a chest tube, but will delay resolution of pneumothorax. She would do poorly with mechanical ventilation, specifically weaning from MV. -flutter valve -sputum culture; agree with cefepime -Poor prognosis to have 4th hospitalization in 4 months. Discussed what she would want if she got to the point with her lungs that we could not get her better. Her only answer was that she has it written down and will ask her daughter to bring her advanced directive to the hospital. -Palliative care consultation recommended. -Tobacco cessation strongly encouraged -chest tube management per primary  Rest of care per primary.  Best Practice (right click and "Reselect all SmartList Selections" daily)   Per primary  Labs   CBC: Recent Labs  Lab 09/16/23 0900 09/16/23  4098 09/17/23 0243 09/18/23 0324  WBC 16.7*  --  12.9* 20.9*  NEUTROABS 13.6*  --   --   --   HGB 14.2 15.0 13.2 12.8  HCT 44.1 44.0 41.1 39.7  MCV 99.5  --  100.5* 99.5  PLT 381  --  313 311    Basic Metabolic Panel: Recent Labs  Lab 09/16/23 0900 09/16/23 0917  09/17/23 0243 09/18/23 0324  NA 140 140 136 134*  K 4.1 4.0 4.5 4.1  CL 106 103 107 99  CO2 26  --  23 23  GLUCOSE 130* 130* 129* 168*  BUN 8 9 11 22   CREATININE 0.95 1.00 0.79 1.29*  CALCIUM 9.2  --  8.4* 8.4*   GFR: Estimated Creatinine Clearance: 30.3 mL/min (A) (by C-G formula based on SCr of 1.29 mg/dL (H)). Recent Labs  Lab 09/16/23 0900 09/17/23 0243 09/18/23 0324  WBC 16.7* 12.9* 20.9*    Liver Function Tests: No results for input(s): "AST", "ALT", "ALKPHOS", "BILITOT", "PROT", "ALBUMIN" in the last 168 hours. No results for input(s): "LIPASE", "AMYLASE" in the last 168 hours. No results for input(s): "AMMONIA" in the last 168 hours.  ABG    Component Value Date/Time   PHART 7.42 09/17/2023 1717   PCO2ART 45 09/17/2023 1717   PO2ART 64 (L) 09/17/2023 1717   HCO3 29.2 (H) 09/17/2023 1717   TCO2 29 09/16/2023 0917   O2SAT 90.1 09/17/2023 1717     Coagulation Profile: No results for input(s): "INR", "PROTIME" in the last 168 hours.  Cardiac Enzymes: No results for input(s): "CKTOTAL", "CKMB", "CKMBINDEX", "TROPONINI" in the last 168 hours.  HbA1C: HbA1c, POC (prediabetic range)  Date/Time Value Ref Range Status  01/17/2023 10:28 AM 5.7 5.7 - 6.4 % Final  06/15/2020 10:27 AM 6.0 5.7 - 6.4 % Final   HbA1c, POC (controlled diabetic range)  Date/Time Value Ref Range Status  08/07/2023 10:26 AM 6.8 0.0 - 7.0 % Final  09/14/2021 09:43 AM 5.7 0.0 - 7.0 % Final   Hgb A1c MFr Bld  Date/Time Value Ref Range Status  09/01/2023 05:27 AM 6.2 (H) 4.8 - 5.6 % Final    Comment:    (NOTE) Pre diabetes:          5.7%-6.4%  Diabetes:              >6.4%  Glycemic control for   <7.0% adults with diabetes   12/18/2021 03:40 PM 5.3 4.8 - 5.6 % Final    Comment:    (NOTE) Pre diabetes:          5.7%-6.4%  Diabetes:              >6.4%  Glycemic control for   <7.0% adults with diabetes     CBG: Recent Labs  Lab 09/17/23 0621 09/17/23 1115 09/17/23 1640  09/17/23 2137 09/18/23 0617  GLUCAP 117* 111* 150* 144* 129*    Review of Systems:   Review of Systems  Constitutional:  Negative for fever.  Respiratory:  Positive for cough, sputum production, shortness of breath and wheezing.   Cardiovascular:  Negative for leg swelling.  Gastrointestinal: Negative.   Skin:  Negative for rash.     Past Medical History:  She,  has a past medical history of Allergy, Anemia, Anxiety, Arthritis, Asthma, Bartholin gland cyst (08/29/2011), Bruises easily, Chronic back pain, Chronic back pain, Chronic kidney disease, COPD (chronic obstructive pulmonary disease) (HCC), Coronary artery disease, Depression, Diabetes mellitus without complication (HCC), Diverticulosis, Fibroadenoma of left  breast, GERD (gastroesophageal reflux disease), H/O hiatal hernia, Heart attack (HCC) (2011), Hemorrhoids, Hernia, Hyperlipidemia, Hypertension, Hypothyroidism, Insomnia, Joint pain, Pneumonia, Pre-diabetes, Psoriasis, Shortness of breath, Slowing of urinary stream, and Stress incontinence.   Surgical History:   Past Surgical History:  Procedure Laterality Date   ABDOMINAL EXPLORATION SURGERY  1977   ABDOMINAL HYSTERECTOMY  1977   "left one of my ovaries"   BACK SURGERY     BIOPSY  05/27/2019   Procedure: BIOPSY;  Surgeon: Corbin Ade, MD;  Location: [facility];  Service: Endoscopy;;   BREAST BIOPSY Left 11/2021   x2   COLONOSCOPY  2011   Dr. Christella Hartigan: Mild diverticulosis, descending diminutive colon polyp (not retrieved), next colonoscopy 10 years   CORONARY ANGIOPLASTY WITH STENT PLACEMENT  2011 X2   "regular stents didn't work; had to go back in in ~ 1 month and put in medicated stents"   DILATION AND CURETTAGE OF UTERUS     ESOPHAGOGASTRODUODENOSCOPY     ESOPHAGOGASTRODUODENOSCOPY (EGD) WITH PROPOFOL N/A 05/27/2019   normal esophagus, dilation, erosive gastropathy s/p biopsy, normal duodenum. Negative H.pylori.    LEFT HEART CATH AND CORONARY ANGIOGRAPHY N/A  03/26/2017   Procedure: LEFT HEART CATH AND CORONARY ANGIOGRAPHY;  Surgeon: Lyn Records, MD;  Location: [facility];  Service: Cardiovascular;  Laterality: N/A;   LUMBAR LAMINECTOMY/DECOMPRESSION MICRODISCECTOMY  08/05/2011   Procedure: LUMBAR LAMINECTOMY/DECOMPRESSION MICRODISCECTOMY;  Surgeon: Clydene Fake, MD;  Location: [facility];  Service: Neurosurgery;  Laterality: Right;  Right Lumbar four-five extraforaminal discectomy   MALONEY DILATION N/A 05/27/2019   Procedure: Elease Hashimoto DILATION;  Surgeon: Corbin Ade, MD;  Location: [facility];  Service: Endoscopy;  Laterality: N/A;  54   SHOULDER ARTHROSCOPY WITH ROTATOR CUFF REPAIR AND OPEN BICEPS TENODESIS Right 12/24/2021   Procedure: RIGHT SHOULDER ARTHROSCOPY WITH ROTATOR CUFF REPAIR AND BICEPS TENODESIS;  Surgeon: Eldred Manges, MD;  Location: [facility] OR;  Service: Orthopedics;  Laterality: Right;   TONSILLECTOMY     as a child   UPPER GASTROINTESTINAL ENDOSCOPY       Social History:   reports that she has been smoking cigarettes. She has a 25 pack-year smoking history. She has never used smokeless tobacco. She reports current alcohol use. She reports current drug use. Drug: Marijuana.   Family History:  Her family history includes Anesthesia problems in her daughter; Breast cancer in her maternal aunt; Cancer in her paternal grandmother; Colon polyps in her brother; Diabetes in her father; Heart disease in her father, maternal grandfather, and mother; Hypertension in her father and mother; Mental illness in her mother; Stroke in her mother; Throat cancer in her maternal uncle; Thyroid disease in her paternal aunt. There is no history of Hypotension, Malignant hyperthermia, Pseudochol deficiency, Colon cancer, Stomach cancer, Esophageal cancer, Pancreatic cancer, or Rectal cancer.   Allergies Allergies  Allergen Reactions   Avocado Anaphylaxis   Latex Shortness Of Breath and Rash   Codeine Nausea Only     Home  Medications  Prior to Admission medications   Medication Sig Start Date End Date Taking? Authorizing Provider  albuterol (VENTOLIN HFA) 108 (90 Base) MCG/ACT inhaler INHALE 2 PUFFS INTO THE LUNGS EVERY 6 HOURS AS NEEDED FOR WHEEZING OR SHORTNESS OF BREATH 07/18/23  Yes [name], MD  alendronate (FOSAMAX) 35 MG tablet Take 1 tablet (35 mg total) by mouth every 7 (seven) days. Take with a full glass of water on an empty stomach. Sit upright for 30-60 mins  after taking 09/16/23  Yes [name], MD  amLODipine (NORVASC) 5 MG tablet Take 1 tablet (5 mg total) by mouth daily. 09/18/22  Yes Monge, Petra Kuba, NP  aspirin EC 81 MG tablet Take 81 mg by mouth in the morning.   Yes [provider]  atorvastatin (LIPITOR) 80 MG tablet Take 1 tablet (80 mg total) by mouth daily, please schedule appointment for more refills 09/18/22  Yes Monge, Petra Kuba, NP  busPIRone (BUSPAR) 15 MG tablet Take 2 tablets (30 mg total) by mouth in the morning AND 1 tablet (15 mg total) daily at 12 noon AND 1 tablet (15 mg total) every evening. Patient taking differently: Take 2 tablets (30mg ) by mouth in the morning AND 2 tablets (30mg ) in the evening for a total daily dose of 60mg . 06/27/23  Yes Nwoko, Uchenna E, PA  clonazePAM (KLONOPIN) 0.5 MG disintegrating tablet Take 1 tablet (0.5 mg total) by mouth 2 (two) times daily. 07/23/23  Yes Bender, Irving Burton, DO  doxepin (SINEQUAN) 75 MG capsule Take 1 capsule (75 mg total) by mouth at bedtime. 06/27/23  Yes Nwoko, Uchenna E, PA  escitalopram (LEXAPRO) 20 MG tablet Take 1 tablet (20 mg total) by mouth daily. 06/27/23  Yes Nwoko, Tommas Olp, PA  ezetimibe (ZETIA) 10 MG tablet Take 1 tablet (10 mg total) by mouth daily. Patient taking differently: Take 10 mg by mouth at bedtime. 07/04/23  Yes Meng, Wynema Birch, PA  Fluticasone-Umeclidin-Vilant (TRELEGY ELLIPTA) 200-62.5-25 MCG/ACT AEPB Inhale 1 puff into the lungs daily. 08/07/23  Yes Martina Sinner, MD  furosemide (LASIX) 20 MG  tablet Take 1 tablet (20 mg total) by mouth daily. Patient taking differently: Take 20-40 mg by mouth daily. 09/18/22  Yes Monge, Petra Kuba, NP  gabapentin (NEURONTIN) 600 MG tablet TAKE 1 TABLET BY MOUTH 4 TIMES DAILY Patient taking differently: Take 600 mg by mouth 2 (two) times daily. 12/20/22  Yes [name], MD  ipratropium-albuterol (DUONEB) 0.5-2.5 (3) MG/3ML SOLN Take 3 mLs by nebulization every 6 (six) hours as needed. 07/23/23  Yes Faith Rogue, DO  levothyroxine (SYNTHROID) 112 MCG tablet Take 1 tablet (112 mcg total) by mouth daily. 12/20/22  Yes [name], MD  losartan (COZAAR) 50 MG tablet Take 1.5 tablets (75 mg total) by mouth daily. 09/18/22  Yes Monge, Petra Kuba, NP  metFORMIN (GLUCOPHAGE) 500 MG tablet Take 1 tablet (500 mg total) by mouth 2 (two) times daily with a meal. Patient taking differently: Take 1 tablet (500 mg total) by mouth 2 (two) times daily with a meal. 04/23/23  Yes [name], MD  mirtazapine (REMERON) 45 MG tablet Take 1 tablet (45 mg total) by mouth at bedtime. 06/27/23  Yes Nwoko, Uchenna E, PA  montelukast (SINGULAIR) 10 MG tablet Take 1 tablet (10 mg total) by mouth at bedtime. 12/20/22  Yes [name], MD  nebivolol (BYSTOLIC) 10 MG tablet Take 1 tablet (10 mg total) by mouth daily. Patient taking differently: Take 10 mg by mouth at bedtime. 09/18/22  Yes Monge, Petra Kuba, NP  nicotine (NICODERM CQ - DOSED IN MG/24 HOURS) 21 mg/24hr patch Place 1 patch (21 mg total) onto the skin daily. 09/06/23  Yes Jerald Kief, MD  nitroGLYCERIN (NITROSTAT) 0.4 MG SL tablet Place 1 tablet (0.4 mg total) under the tongue every 5 (five) minutes as needed for chest pain. 09/18/22 08/31/24 Yes Monge, Petra Kuba, NP  omeprazole (PRILOSEC) 40 MG capsule Take 1 capsule (40 mg total) by  mouth 2 (two) times daily before a meal. 09/08/23  Yes Lemmon, Violet Baldy, PA  predniSONE (DELTASONE) 10 MG tablet Take 4 tablets (40 mg total) by mouth daily for 3 days, THEN 2  tablets (20 mg total) daily for 3 days, THEN 1 tablet (10 mg total) daily for 3 days, THEN 0.5 tablets (5 mg total) daily for 2 days. 09/05/23 09/16/23 Yes Jerald Kief, MD  ticagrelor (BRILINTA) 60 MG TABS tablet Take 1 tablet (60 mg total) by mouth 2 (two) times daily. 09/18/22  Yes Monge, Petra Kuba, NP  Blood Glucose Monitoring Suppl (ACCU-CHEK GUIDE) w/Device KIT Use to check blood sugars once a day 09/05/23   [name], MD  glucose blood (TRUE METRIX BLOOD GLUCOSE TEST) test strip Use as instructed to check blood sugars once a day 09/05/23   [name], MD  Spacer/Aero-Holding [facility] Use with the albuterol inhaler. 07/22/23   Marrianne Mood, MD  TRUEplus Lancets 28G MISC Use to check blood sugars once a day 09/05/23   [name], MD  Zoster Vaccine Adjuvanted [facility]) injection Inject 0.5 mLs into the muscle once for 1 dose. 05/20/23 05/20/23  [name], MD     Critical care time:      Steffanie Dunn, DO 09/18/23 8:50 AM [facility]  For contact information, see Amion. If no response to pager, please call PCCM consult pager. After hours, 7PM- 7AM, please call Elink.

## 2023-09-18 NOTE — Progress Notes (Signed)
 Pt has taken her HHFNC off x 2 this morning resulting in pt desaturating.  HFNC was broke in this current incident of pt taking oxygen off ( broken piece replaced).   Rapid response RN checking on pt now, myself and RR RN has educated pt on leaving HHFNC on and not taking it off.  Also, pt educated on the complications of becoming hypoxic when she is taking her oxygen off x 2 today.  Bedside RN in room and aware.    Once pt back on oxygen, sat is now 93-95%.  Flow was weaned to 35 lpm.  No distress noted currently, pt denies SOB at this time.

## 2023-09-18 NOTE — Progress Notes (Signed)
 Pt requesting pain medication. Pt stated she felt something between her legs. I reminded her that we had placed foley catheter earlier. Noted to have pink tinged urine in foley tubing. Asked pt if she had tried to pull out. She said "no but my finger might have got stuff and pulled it". I explained that it would cause trauma and pain if she pulled on tubing. Pt understands. Urine is flowing and appears to be in correct location. Pt resting with call bell within reach.  Will continue to monitor.

## 2023-09-19 ENCOUNTER — Ambulatory Visit: Payer: Medicare HMO | Admitting: Internal Medicine

## 2023-09-19 ENCOUNTER — Inpatient Hospital Stay (HOSPITAL_COMMUNITY)

## 2023-09-19 DIAGNOSIS — S270XXA Traumatic pneumothorax, initial encounter: Secondary | ICD-10-CM | POA: Diagnosis not present

## 2023-09-19 DIAGNOSIS — S2241XA Multiple fractures of ribs, right side, initial encounter for closed fracture: Secondary | ICD-10-CM | POA: Diagnosis not present

## 2023-09-19 DIAGNOSIS — Z7189 Other specified counseling: Secondary | ICD-10-CM | POA: Diagnosis not present

## 2023-09-19 DIAGNOSIS — Z515 Encounter for palliative care: Secondary | ICD-10-CM | POA: Diagnosis not present

## 2023-09-19 DIAGNOSIS — J9621 Acute and chronic respiratory failure with hypoxia: Secondary | ICD-10-CM | POA: Diagnosis not present

## 2023-09-19 DIAGNOSIS — J441 Chronic obstructive pulmonary disease with (acute) exacerbation: Secondary | ICD-10-CM | POA: Diagnosis not present

## 2023-09-19 DIAGNOSIS — J939 Pneumothorax, unspecified: Secondary | ICD-10-CM | POA: Diagnosis not present

## 2023-09-19 LAB — CBC
HCT: 37.6 % (ref 36.0–46.0)
Hemoglobin: 12.1 g/dL (ref 12.0–15.0)
MCH: 32 pg (ref 26.0–34.0)
MCHC: 32.2 g/dL (ref 30.0–36.0)
MCV: 99.5 fL (ref 80.0–100.0)
Platelets: 291 10*3/uL (ref 150–400)
RBC: 3.78 MIL/uL — ABNORMAL LOW (ref 3.87–5.11)
RDW: 15.5 % (ref 11.5–15.5)
WBC: 14.1 10*3/uL — ABNORMAL HIGH (ref 4.0–10.5)
nRBC: 0 % (ref 0.0–0.2)

## 2023-09-19 LAB — BASIC METABOLIC PANEL
Anion gap: 4 — ABNORMAL LOW (ref 5–15)
BUN: 26 mg/dL — ABNORMAL HIGH (ref 8–23)
CO2: 27 mmol/L (ref 22–32)
Calcium: 8.3 mg/dL — ABNORMAL LOW (ref 8.9–10.3)
Chloride: 106 mmol/L (ref 98–111)
Creatinine, Ser: 1 mg/dL (ref 0.44–1.00)
GFR, Estimated: >60 mL/min (ref >60)
Glucose, Bld: 118 mg/dL — ABNORMAL HIGH (ref 70–99)
Potassium: 4 mmol/L (ref 3.5–5.1)
Sodium: 137 mmol/L (ref 135–145)

## 2023-09-19 LAB — GLUCOSE, CAPILLARY
Glucose-Capillary: 102 mg/dL — ABNORMAL HIGH (ref 70–99)
Glucose-Capillary: 133 mg/dL — ABNORMAL HIGH (ref 70–99)
Glucose-Capillary: 268 mg/dL — ABNORMAL HIGH (ref 70–99)
Glucose-Capillary: 150 mg/dL — ABNORMAL HIGH (ref 70–99)

## 2023-09-19 MED ORDER — IPRATROPIUM-ALBUTEROL 0.5-2.5 (3) MG/3ML IN SOLN
3.0000 mL | Freq: Three times a day (TID) | RESPIRATORY_TRACT | Status: DC
  Administered 2023-09-20 – 2023-09-22 (×7): 3 mL via RESPIRATORY_TRACT
  Filled 2023-09-19 (×7): qty 3

## 2023-09-19 MED ORDER — CHLORHEXIDINE GLUCONATE CLOTH 2 % EX PADS
6.0000 | MEDICATED_PAD | Freq: Every day | CUTANEOUS | Status: DC
  Administered 2023-09-19: 6 via TOPICAL

## 2023-09-19 NOTE — Progress Notes (Signed)
 Occupational Therapy Treatment Patient Details Name: [name] [name] MRN: [id number] DOB: 03-29-1956 Today's Date: 09/19/2023   History of present illness 68 y.o. female [facility]16/23 after falling out of bed with R sided rib fxs 4-9 and R PTX. Prior admit 08/31/23 w/ acute COPD exacerbation and Aspiration PNA. PMH: HTN, CAD with multiple DES, DM2, COPD, anxiety, hypothyroidism, RTC repair 12/24/2021, ongoing tobacco abuse.   OT comments  Patient received in supine and agreeable to OT session. Patient requiring mod assist to get to EOB due to right rib pain and assistance with BLEs. Patient attempted to donn socks seated on EOB but difficulty reaching feet due to pain and required mod assist. Patient used RW to transfer to recliner with CGA. Grooming tasks performed seated in recliner due to pain when standing. Patient on 10 liters O2 with HFNC with SpO2 100%. Discharge recommendations continue to be appropriate. Acute OT to continue to follow to address established goals to facilitate DC to next venue of care.       If plan is discharge home, recommend the following:  A little help with walking and/or transfers;A little help with bathing/dressing/bathroom;Assistance with cooking/housework;Assist for transportation;Help with stairs or ramp for entrance   Equipment Recommendations  None recommended by OT    Recommendations for Other Services      Precautions / Restrictions Precautions Precautions: Other (comment);Fall Recall of Precautions/Restrictions: Intact Precaution/Restrictions Comments: watch SpO2, chest tube Restrictions Weight Bearing Restrictions Per Provider Order: No       Mobility Bed Mobility Overal bed mobility: Needs Assistance Bed Mobility: Rolling, Sidelying to Sit Rolling: Supervision Sidelying to sit: Mod assist       General bed mobility comments: assistance for BLEs and trunk    Transfers Overall transfer level: Needs assistance Equipment used:  Rolling walker (2 wheels) Transfers: Sit to/from Stand, Bed to chair/wheelchair/BSC Sit to Stand: Contact guard assist     Step pivot transfers: Contact guard assist     General transfer comment: CGA for safety and stability     Balance Overall balance assessment: Needs assistance Sitting-balance support: No upper extremity supported, Feet supported Sitting balance-Leahy Scale: Good     Standing balance support: Bilateral upper extremity supported Standing balance-Leahy Scale: Fair Standing balance comment: reliant on RW during transfer                           ADL either performed or assessed with clinical judgement   ADL Overall ADL's : Needs assistance/impaired     Grooming: Set up;Sitting Grooming Details (indicate cue type and reason): performed in sitting due to rib pain             Lower Body Dressing: Moderate assistance;Sitting/lateral leans Lower Body Dressing Details (indicate cue type and reason): required increased assistance due to difficulty reaching feet due to pain Toilet Transfer: Contact guard assist;Rolling walker (2 wheels) Toilet Transfer Details (indicate cue type and reason): simulated           General ADL Comments: patient with increased rib pain    Extremity/Trunk Assessment              Vision       Perception     Praxis     Communication Communication Communication: No apparent difficulties   Cognition Arousal: Alert Behavior During Therapy: WFL for tasks assessed/performed Cognition: No apparent impairments  Following commands: Intact        Cueing   Cueing Techniques: Verbal cues  Exercises      Shoulder Instructions       General Comments 10 liters HFNC SpO2 100%, BP 124/56 (77)    Pertinent Vitals/ Pain       Pain Assessment Pain Assessment: Faces Faces Pain Scale: Hurts even more Pain Location: R ribs Pain Descriptors / Indicators: Aching,  Discomfort Pain Intervention(s): Limited activity within patient's tolerance, Monitored during session, Repositioned, RN gave pain meds during session  Home Living                                          Prior Functioning/Environment              Frequency  Min 1X/week        Progress Toward Goals  OT Goals(current goals can now be found in the care plan section)  Progress towards OT goals: Progressing toward goals  Acute Rehab OT Goals Patient Stated Goal: get better OT Goal Formulation: With patient Time For Goal Achievement: 10/01/23 Potential to Achieve Goals: Good ADL Goals Pt Will Perform Grooming: with modified independence;standing Pt Will Perform Lower Body Dressing: with set-up;sit to/from stand Pt Will Transfer to Toilet: with modified independence;ambulating  Plan      Co-evaluation                 AM-PAC OT "6 Clicks" Daily Activity     Outcome Measure   Help from another person eating meals?: None Help from another person taking care of personal grooming?: A Little Help from another person toileting, which includes using toliet, bedpan, or urinal?: A Little Help from another person bathing (including washing, rinsing, drying)?: A Little Help from another person to put on and taking off regular upper body clothing?: A Little Help from another person to put on and taking off regular lower body clothing?: A Little 6 Click Score: 19    End of Session Equipment Utilized During Treatment: Rolling walker (2 wheels);Oxygen  OT Visit Diagnosis: Unsteadiness on feet (R26.81);Muscle weakness (generalized) (M62.81);Other (comment) (decreased activity tolerance)   Activity Tolerance Patient tolerated treatment well   Patient Left in chair;with call bell/phone within reach;with chair alarm set   Nurse Communication Mobility status        Time: 1030-1103 OT Time Calculation (min): 33 min  Charges: OT General Charges $OT  Visit: 1 Visit OT Treatments $Self Care/Home Management : 8-22 mins $Therapeutic Activity: 8-22 mins  Alfonse Flavors, OTA Acute Rehabilitation Services  Office [phone]   Dewain Penning 09/19/2023, 2:12 PM

## 2023-09-19 NOTE — Progress Notes (Signed)
 Daily Progress Note   Patient Name: [name] [name]       Date: 09/19/2023 DOB: [birth year]  Age: 68 y.o. MRN#: [id number] Attending Physician: Roslynn Amble, MD Primary Care Physician: Martina Sinner, MD Admit Date: 09/16/2023  Reason for Consultation/Follow-up: Establishing goals of care  Subjective: Feeling better today than yesterday Titrating oxygen down Feels good about goals of care conversation yesterday  Length of Stay: 3  Current Medications: Scheduled Meds:   acetaminophen  1,000 mg Oral Q6H   amLODipine  5 mg Oral Daily   arformoterol  15 mcg Nebulization BID   aspirin EC  81 mg Oral Daily   atorvastatin  80 mg Oral Daily   budesonide (PULMICORT) nebulizer solution  0.5 mg Nebulization BID   busPIRone  30 mg Oral q AM   And   busPIRone  15 mg Oral Q1200   And   busPIRone  15 mg Oral QPM   Chlorhexidine Gluconate Cloth  6 each Topical Daily   clonazePAM  0.5 mg Oral BID   docusate sodium  100 mg Oral BID   doxepin  75 mg Oral QHS   enoxaparin (LOVENOX) injection  30 mg Subcutaneous Q12H   escitalopram  20 mg Oral Daily   ezetimibe  10 mg Oral QHS   furosemide  20 mg Oral Daily   gabapentin  600 mg Oral BID   guaiFENesin  600 mg Oral BID   insulin aspart  0-15 Units Subcutaneous TID WC   insulin aspart  0-5 Units Subcutaneous QHS   ipratropium-albuterol  3 mL Nebulization Q4H   levothyroxine  112 mcg Oral Daily   lidocaine  1 patch Transdermal Q24H   losartan  75 mg Oral Daily   methylPREDNISolone (SOLU-MEDROL) injection  40 mg Intravenous Q24H   mirtazapine  45 mg Oral QHS   montelukast  10 mg Oral QHS   nebivolol  10 mg Oral QHS   nicotine  21 mg Transdermal Daily   revefenacin  175 mcg Nebulization Daily   ticagrelor  60 mg Oral BID    Continuous Infusions:  ceFEPime  (MAXIPIME) IV 2 g (09/19/23 0824)    PRN Meds: albuterol, hydrALAZINE, HYDROmorphone (DILAUDID) injection, metoprolol tartrate, oxyCODONE, polyethylene glycol, prochlorperazine  Physical Exam Constitutional:      General: She is not in acute distress.    Appearance: She is ill-appearing.  Pulmonary:     Effort: Pulmonary effort is normal.  Skin:    General: Skin is warm and dry.  Neurological:     Mental Status: She is alert and oriented to person, place, and time.             Vital Signs: BP (!) 107/53 (BP Location: Left Arm)   Pulse 65   Temp 98.6 F (37 C) (Oral)   Resp 18   Ht 4\' 9"  (1.448 m)   Wt 55.5 kg   SpO2 97%   BMI 26.48 kg/m  SpO2: SpO2: 97 % O2 Device: O2 Device: High Flow Nasal Cannula O2 Flow Rate: O2 Flow Rate (L/min): (S) 10 L/min  Intake/output summary:  Intake/Output Summary (Last 24 hours) at 09/19/2023 1324 Last data filed at 09/19/2023 0600 Gross per 24  hour  Intake 450 ml  Output 2037 ml  Net -1587 ml   LBM: Last BM Date : 09/16/23 Baseline Weight: Weight: 69.9 kg Most recent weight: Weight: 55.5 kg       Palliative Assessment/Data: PPS 40%      Patient Active Problem List   Diagnosis Date Noted   Pneumothorax, right 09/16/2023   COPD with acute exacerbation (HCC) 09/01/2023   Chronic hypoxic respiratory failure (HCC) 09/01/2023   Continuous dependence on cigarette smoking 09/01/2023   History of CAD (coronary artery disease) 09/01/2023   CAP (community acquired pneumonia) 09/01/2023   Infection due to parainfluenza virus 4 06/20/2023   Full code status 06/14/2023   Acute hypoxic respiratory failure (HCC) 06/13/2023   Acute exacerbation of chronic obstructive pulmonary disease (COPD) (HCC) 06/12/2023   Nicotine dependence 05/03/2023   Substance abuse (HCC) 09/10/2022   Iron deficiency anemia 11/02/2021   Major depressive disorder, recurrent, in full remission with anxious distress (HCC) 05/31/2021   Chronic neck pain 02/26/2021    GAD (generalized anxiety disorder) 01/08/2021   Esophageal dysphagia 04/28/2019   Bipolar I disorder (HCC) 09/17/2018   Insomnia 10/28/2017   Prediabetes 05/28/2017   QT prolongation 03/25/2017   Psoriasis 06/22/2015   COPD (chronic obstructive pulmonary disease) (HCC)GOLD E 05/18/2015   Anxiety and depression 07/10/2013   Essential hypertension 06/07/2013   Hyperlipidemia 06/07/2013   Asthma, chronic 06/07/2013   Emphysema lung (HCC) 06/07/2013   Chronic back pain 06/07/2013   CAD S/P percutaneous coronary angioplasty 06/07/2013   GERD (gastroesophageal reflux disease) 06/07/2013   Breast lump on left side at 3 o'clock position 10/13/2012   S/P abdominal hysterectomy and right salpingo-oophorectomy 08/29/2011   COPD exacerbation (HCC) 09/15/2009   TOBACCO ABUSE 07/17/2009   Chronic rhinitis 07/17/2009   Lung nodule < 6cm on CT 07/17/2009   Hypothyroidism 12/22/2008   Chronic depression 12/22/2008    Palliative Care Assessment & Plan   HPI: 68 y.o. female  with past medical history of chronic respiratory failure on 2L O2 [place] at home, COPD with recent exacerbation/multiple hospitalizations, HTN, HLD, CAD/MI with stents on plavix and ASA, DM, depression/anxiety, and hypothyroidism  admitted on 09/16/2023 with right-sided rib fractures and a R PTX. Chest tube placed. Also treating for possible pneumonia. PMT consulted to discuss GOC.  Assessment: Follow up today with patient - feeling better.  CXR improved, weaning oxygen down. WBC improved.  No questions or concerns about our conversation yesterday, feels comfortable with all that was said. About to work with PT.  Call to daughter as well - update provided. Also feels good about conversations yesterday and decisions made. All questions and concerns addressed.  PMT will check in Monday.   Recommendations/Plan: DNR/DNI  daughter as surrogate decision maker if patient unable - patients main goal is to maintain as much independence  as possible - would not consider good quality of life if independence is lost and so would not want medical procedures that prolonged this condition - okay with current measures - continuing HFNC, open to bipap if needed - would never want HD or feeding tubes  PMT to check in Mon  Code Status: DNR  Discharge Planning: To Be Determined  Care plan was discussed with patient and daughter  Thank you for allowing the Palliative Medicine Team to assist in the care of this patient.   Total Time 35 minutes Prolonged Time Billed  no   Time spent includes: Detailed review of medical records (labs, imaging, vital signs), medically  appropriate exam, discussion with treatment team, counseling and educating patient, family and/or staff, documenting clinical information, medication management and coordination of care.     *Please note that this is a verbal dictation therefore any spelling or grammatical errors are due to the "[employer] One" system interpretation.  Gerlean Ren, DNP, [facility] Palliative Medicine Team Team Phone # [phone]  Pager [phone]

## 2023-09-19 NOTE — Progress Notes (Signed)
 Physical Therapy Treatment Patient Details Name: [name] [name] MRN: [id number] DOB: January 16, 1956 Today's Date: 09/19/2023   History of Present Illness 68 y.o. female presents to [facility] 09/16/23 after falling out of bed with R sided rib fxs 4-9 and R PTX. Prior admit 08/31/23 w/ acute COPD exacerbation and Aspiration PNA. PMH: HTN, CAD with multiple DES, DM2, COPD, anxiety, hypothyroidism, RTC repair 12/24/2021, ongoing tobacco abuse.    PT Comments  Pt in bed upon arrival and agreeable to PT session. In today's session, pt required MinA for bed mobility and CGA to stand with a RW. Limited gait distance due to keeping chest tube on wall suction. Pt was able to perform two sets of ambulating 10 ft forwards/backwards with a RW and CGA for safety. Pt maintained SpO2 >94% on 6L during activity. Pt reported that upon d/c from the hospital, she will be able to stay with her daughter and have 24/7 physical assistance from family members. Pt is progressing towards goals. Acute PT to follow.      If plan is discharge home, recommend the following: Assist for transportation;Assistance with cooking/housework;A little help with walking and/or transfers;Help with stairs or ramp for entrance   Can travel by private vehicle      Yes  Equipment Recommendations  None recommended by PT       Precautions / Restrictions Precautions Precautions: Other (comment);Fall Recall of Precautions/Restrictions: Intact Precaution/Restrictions Comments: watch SpO2, chest tube Restrictions Weight Bearing Restrictions Per Provider Order: No     Mobility  Bed Mobility Overal bed mobility: Needs Assistance Bed Mobility: Rolling, Sidelying to Sit, Sit to Sidelying Rolling: Supervision Sidelying to sit: Min assist, HOB elevated    Sit to sidelying: Min assist, HOB elevated General bed mobility comments: MinA for sup/sit for trunk elevation. MinA for return to supine for slight B LE assistance    Transfers Overall transfer  level: Needs assistance Equipment used: Rolling walker (2 wheels) Transfers: Sit to/from Stand Sit to Stand: Contact guard assist    General transfer comment: CGA for safety    Ambulation/Gait Ambulation/Gait assistance: Contact guard assist Gait Distance (Feet): 10 Feet (x10, x10) Assistive device: Rolling walker (2 wheels) Gait Pattern/deviations: Step-through pattern, Decreased stride length Gait velocity: decreased    General Gait Details: limited gait distance due to chest tube on suction, able to step forwards/backwards x5 ft, 2 sets    Balance Overall balance assessment: Needs assistance Sitting-balance support: No upper extremity supported, Feet supported Sitting balance-Leahy Scale: Good     Standing balance support: Bilateral upper extremity supported Standing balance-Leahy Scale: Fair Standing balance comment: reliant on RW       Communication Communication Communication: No apparent difficulties  Cognition Arousal: Alert Behavior During Therapy: WFL for tasks assessed/performed   PT - Cognitive impairments: No apparent impairments    Following commands: Intact      Cueing Cueing Techniques: Verbal cues  Exercises General Exercises - Lower Extremity Ankle Circles/Pumps: AROM, Both, Supine, 20 reps Straight Leg Raises: AROM, Both, 10 reps, Supine Hip Flexion/Marching: AROM, Both, 10 reps, Standing    General Comments General comments (skin integrity, edema, etc.): on 8L upon arrival, able to ambulate and perform exercises on 6L with SpO2 >94%. RN notified      Pertinent Vitals/Pain Pain Assessment Pain Assessment: Faces Faces Pain Scale: Hurts even more Pain Location: R ribs Pain Descriptors / Indicators: Aching, Discomfort Pain Intervention(s): Limited activity within patient's tolerance, Monitored during session, Repositioned     PT Goals (current goals  can now be found in the care plan section) Acute Rehab PT Goals PT Goal Formulation: With  patient Time For Goal Achievement: 10/01/23 Potential to Achieve Goals: Good Progress towards PT goals: Progressing toward goals    Frequency    Min 3X/week       AM-PAC PT "6 Clicks" Mobility   Outcome Measure  Help needed turning from your back to your side while in a flat bed without using bedrails?: A Little Help needed moving from lying on your back to sitting on the side of a flat bed without using bedrails?: A Little Help needed moving to and from a bed to a chair (including a wheelchair)?: A Little Help needed standing up from a chair using your arms (e.g., wheelchair or bedside chair)?: A Little Help needed to walk in hospital room?: A Little Help needed climbing 3-5 steps with a railing? : A Lot 6 Click Score: 17    End of Session Equipment Utilized During Treatment: Gait belt;Oxygen Activity Tolerance: Patient tolerated treatment well Patient left: in bed;with call bell/phone within reach Nurse Communication: Mobility status (02 sats) PT Visit Diagnosis: Muscle weakness (generalized) (M62.81);Unsteadiness on feet (R26.81)     Time: 1540-1606 PT Time Calculation (min) (ACUTE ONLY): 26 min  Charges:    $Gait Training: 8-22 mins $Therapeutic Exercise: 8-22 mins PT General Charges $$ ACUTE PT VISIT: 1 Visit                     Hilton Cork, PT, DPT Secure Chat Preferred  Rehab Office [phone]   Arturo Morton Brion Aliment 09/19/2023, 4:57 PM

## 2023-09-19 NOTE — Consult Note (Signed)
 NAME:  [name] [name], MRN:  [id number], DOB:  [birth year], LOS: 3 ADMISSION DATE:  09/16/2023, CONSULTATION DATE:  3/6 REFERRING MD:  trauma service, CHIEF COMPLAINT:  hypoxia   History of Present Illness:  MS. [name] [name] is a 68 y/o woman with a history of COPD and chronic respiratory failure on 3L admitted on 3/4 after a fall out of bed causing right-sided rib fractures and R pneumothorax. In the last day she has had increasing oxygen requirements. She recently was admitted from 2/17- 2/21 for COPD AE and pneumonia. She has had 4 admissions for COPD since early December 2024. Since admission she has had escalating oxygen requirements. She was started on cefepime yesterday. She has had soft blood pressures. Since admission she has been on pulmicort, yupelri, brovana, prednisone 5mg  daily, and montelukast. PTA on trelegy, prednisone. Her main complaint is her pain from rib fractures.   Pertinent  Medical History  COPD Chronic respiratory failure Tobacco abuse Hypothyroidism HTN CAD w/ MI Pre-DM GERD Anxiety & depression  Significant Hospital Events: Including procedures, antibiotic start and stop dates in addition to other pertinent events     Interim History / Subjective:   Sitting at bedside. Not wearing O2 with sats 98% Audible rhonchi and wheezing Now DNR but intubation Objective   Blood pressure (!) 107/53, pulse 65, temperature 98.6 F (37 C), temperature source Oral, resp. rate 18, height 4\' 9"  (1.448 m), weight 55.5 kg, SpO2 97%.    FiO2 (%):  [45 %] 45 %   Intake/Output Summary (Last 24 hours) at 09/19/2023 1248 Last data filed at 09/19/2023 0600 Gross per 24 hour  Intake 450 ml  Output 2037 ml  Net -1587 ml   Filed Weights   09/16/23 0903 09/17/23 0523 09/18/23 0338  Weight: 69.9 kg 68.4 kg 55.5 kg   Physical Exam: General: Chronically ill-appearing, no acute distress but dyspneic, sitting in chair HENT: [place], AT, OP clear, MMM Eyes: EOMI, no scleral icterus Respiratory:  Rhonchi to auscultation bilaterally with scattered wheezing  Cardiovascular: RRR, -M/R/G, no JVD Extremities:-Edema,-tenderness Neuro: AAO x4, CNII-XII grossly intact Psych: Normal mood, normal affect  CXR 09/19/23 Right apical pneumothorax with samll residual, improved chest wall gas and basilar opacities  WBC improved 14 Normalized AKI  Resolved Hospital Problem list     Assessment & Plan:   Fall causing rib fractures RLL pneumonia, presumed aspiration Acute on chronic respiratory failure with hypoxia  R traumatic pneumothorax COPD with acute exacerbation H/o tobacco use, ongoing -Pulmonary hygiene: triple nebs, metanebs, mucinex, flutter valve -Prednisone to 40mg  daily 3/6, but may delay fracture healing -pain control per primary -REcommend against BiPAP unless she needs it for WOB; it is not contraindicated with pneumothorax since she has a chest tube, but will delay resolution of pneumothorax. She would do poorly with mechanical ventilation, specifically weaning from MV. -sputum culture; agree with cefepime -Poor prognosis to have 4th hospitalization in 4 months. Confirmed limited MV if needed -Palliative care consulted -Tobacco cessation strongly encouraged -chest tube management per primary  Rest of care per primary.  Best Practice (right click and "Reselect all SmartList Selections" daily)   Per primary  Labs   CBC: Recent Labs  Lab 09/16/23 0900 09/16/23 0917 09/17/23 0243 09/18/23 0324 09/19/23 0311  WBC 16.7*  --  12.9* 20.9* 14.1*  NEUTROABS 13.6*  --   --   --   --   HGB 14.2 15.0 13.2 12.8 12.1  HCT 44.1 44.0 41.1 39.7 37.6  MCV 99.5  --  100.5* 99.5 99.5  PLT 381  --  313 311 291    Basic Metabolic Panel: Recent Labs  Lab 09/16/23 0900 09/16/23 0917 09/17/23 0243 09/18/23 0324 09/19/23 0311  NA 140 140 136 134* 137  K 4.1 4.0 4.5 4.1 4.0  CL 106 103 107 99 106  CO2 26  --  23 23 27   GLUCOSE 130* 130* 129* 168* 118*  BUN 8 9 11 22  26*   CREATININE 0.95 1.00 0.79 1.29* 1.00  CALCIUM 9.2  --  8.4* 8.4* 8.3*   GFR: Estimated Creatinine Clearance: 39.1 mL/min (by C-G formula based on SCr of 1 mg/dL). Recent Labs  Lab 09/16/23 0900 09/17/23 0243 09/18/23 0324 09/19/23 0311  WBC 16.7* 12.9* 20.9* 14.1*    Liver Function Tests: No results for input(s): "AST", "ALT", "ALKPHOS", "BILITOT", "PROT", "ALBUMIN" in the last 168 hours. No results for input(s): "LIPASE", "AMYLASE" in the last 168 hours. No results for input(s): "AMMONIA" in the last 168 hours.  ABG    Component Value Date/Time   PHART 7.34 (L) 09/18/2023 0906   PCO2ART 53 (H) 09/18/2023 0906   PO2ART 70 (L) 09/18/2023 0906   HCO3 28.1 (H) 09/18/2023 0906   TCO2 29 09/16/2023 0917   O2SAT 94 09/18/2023 0906     Coagulation Profile: No results for input(s): "INR", "PROTIME" in the last 168 hours.  Cardiac Enzymes: No results for input(s): "CKTOTAL", "CKMB", "CKMBINDEX", "TROPONINI" in the last 168 hours.  HbA1C: HbA1c, POC (prediabetic range)  Date/Time Value Ref Range Status  01/17/2023 10:28 AM 5.7 5.7 - 6.4 % Final  06/15/2020 10:27 AM 6.0 5.7 - 6.4 % Final   HbA1c, POC (controlled diabetic range)  Date/Time Value Ref Range Status  08/07/2023 10:26 AM 6.8 0.0 - 7.0 % Final  09/14/2021 09:43 AM 5.7 0.0 - 7.0 % Final   Hgb A1c MFr Bld  Date/Time Value Ref Range Status  09/01/2023 05:27 AM 6.2 (H) 4.8 - 5.6 % Final    Comment:    (NOTE) Pre diabetes:          5.7%-6.4%  Diabetes:              >6.4%  Glycemic control for   <7.0% adults with diabetes   12/18/2021 03:40 PM 5.3 4.8 - 5.6 % Final    Comment:    (NOTE) Pre diabetes:          5.7%-6.4%  Diabetes:              >6.4%  Glycemic control for   <7.0% adults with diabetes     CBG: Recent Labs  Lab 09/18/23 1113 09/18/23 1631 09/18/23 2104 09/19/23 0624 09/19/23 1204  GLUCAP 97 166* 137* 102* 133*    Review of Systems:   Review of Systems  Constitutional:   Negative for fever.  Respiratory:  Positive for cough, sputum production, shortness of breath and wheezing.   Cardiovascular:  Negative for leg swelling.  Gastrointestinal: Negative.   Skin:  Negative for rash.     Past Medical History:  She,  has a past medical history of Allergy, Anemia, Anxiety, Arthritis, Asthma, Bartholin gland cyst (08/29/2011), Bruises easily, Chronic back pain, Chronic back pain, Chronic kidney disease, COPD (chronic obstructive pulmonary disease) (HCC), Coronary artery disease, Depression, Diabetes mellitus without complication (HCC), Diverticulosis, Fibroadenoma of left breast, GERD (gastroesophageal reflux disease), H/O hiatal hernia, Heart attack (HCC) (2011), Hemorrhoids, Hernia, Hyperlipidemia, Hypertension, Hypothyroidism, Insomnia, Joint pain, Pneumonia, Pre-diabetes, Psoriasis, Shortness of breath, Slowing of  urinary stream, and Stress incontinence.   Surgical History:   Past Surgical History:  Procedure Laterality Date   ABDOMINAL EXPLORATION SURGERY  1977   ABDOMINAL HYSTERECTOMY  1977   "left one of my ovaries"   BACK SURGERY     BIOPSY  05/27/2019   Procedure: BIOPSY;  Surgeon: Corbin Ade, MD;  Location: [facility];  Service: Endoscopy;;   BREAST BIOPSY Left 11/2021   x2   COLONOSCOPY  2011   Dr. Christella Hartigan: Mild diverticulosis, descending diminutive colon polyp (not retrieved), next colonoscopy 10 years   CORONARY ANGIOPLASTY WITH STENT PLACEMENT  2011 X2   "regular stents didn't work; had to go back in in ~ 1 month and put in medicated stents"   DILATION AND CURETTAGE OF UTERUS     ESOPHAGOGASTRODUODENOSCOPY     ESOPHAGOGASTRODUODENOSCOPY (EGD) WITH PROPOFOL N/A 05/27/2019   normal esophagus, dilation, erosive gastropathy s/p biopsy, normal duodenum. Negative H.pylori.    LEFT HEART CATH AND CORONARY ANGIOGRAPHY N/A 03/26/2017   Procedure: LEFT HEART CATH AND CORONARY ANGIOGRAPHY;  Surgeon: Lyn Records, MD;  Location: [facility];   Service: Cardiovascular;  Laterality: N/A;   LUMBAR LAMINECTOMY/DECOMPRESSION MICRODISCECTOMY  08/05/2011   Procedure: LUMBAR LAMINECTOMY/DECOMPRESSION MICRODISCECTOMY;  Surgeon: Clydene Fake, MD;  Location: [facility];  Service: Neurosurgery;  Laterality: Right;  Right Lumbar four-five extraforaminal discectomy   MALONEY DILATION N/A 05/27/2019   Procedure: Elease Hashimoto DILATION;  Surgeon: Corbin Ade, MD;  Location: [facility];  Service: Endoscopy;  Laterality: N/A;  54   SHOULDER ARTHROSCOPY WITH ROTATOR CUFF REPAIR AND OPEN BICEPS TENODESIS Right 12/24/2021   Procedure: RIGHT SHOULDER ARTHROSCOPY WITH ROTATOR CUFF REPAIR AND BICEPS TENODESIS;  Surgeon: Eldred Manges, MD;  Location: [facility];  Service: Orthopedics;  Laterality: Right;   TONSILLECTOMY     as a child   UPPER GASTROINTESTINAL ENDOSCOPY       Social History:   reports that she has been smoking cigarettes. She has a 25 pack-year smoking history. She has never used smokeless tobacco. She reports current alcohol use. She reports current drug use. Drug: Marijuana.   Family History:  Her family history includes Anesthesia problems in her daughter; Breast cancer in her maternal aunt; Cancer in her paternal grandmother; Colon polyps in her brother; Diabetes in her father; Heart disease in her father, maternal grandfather, and mother; Hypertension in her father and mother; Mental illness in her mother; Stroke in her mother; Throat cancer in her maternal uncle; Thyroid disease in her paternal aunt. There is no history of Hypotension, Malignant hyperthermia, Pseudochol deficiency, Colon cancer, Stomach cancer, Esophageal cancer, Pancreatic cancer, or Rectal cancer.   Allergies Allergies  Allergen Reactions   Avocado Anaphylaxis   Latex Shortness Of Breath and Rash   Codeine Nausea Only     Home Medications  Prior to Admission medications   Medication Sig Start Date End Date Taking? Authorizing Provider  albuterol (VENTOLIN HFA)  108 (90 Base) MCG/ACT inhaler INHALE 2 PUFFS INTO THE LUNGS EVERY 6 HOURS AS NEEDED FOR WHEEZING OR SHORTNESS OF BREATH 07/18/23  Yes [name], MD  alendronate (FOSAMAX) 35 MG tablet Take 1 tablet (35 mg total) by mouth every 7 (seven) days. Take with a full glass of water on an empty stomach. Sit upright for 30-60 mins after taking 09/16/23  Yes [name], MD  amLODipine (NORVASC) 5 MG tablet Take 1 tablet (5 mg total) by mouth daily. 09/18/22  Yes Monge,  Petra Kuba, NP  aspirin EC 81 MG tablet Take 81 mg by mouth in the morning.   Yes [provider]  atorvastatin (LIPITOR) 80 MG tablet Take 1 tablet (80 mg total) by mouth daily, please schedule appointment for more refills 09/18/22  Yes Monge, Petra Kuba, NP  busPIRone (BUSPAR) 15 MG tablet Take 2 tablets (30 mg total) by mouth in the morning AND 1 tablet (15 mg total) daily at 12 noon AND 1 tablet (15 mg total) every evening. Patient taking differently: Take 2 tablets (30mg ) by mouth in the morning AND 2 tablets (30mg ) in the evening for a total daily dose of 60mg . 06/27/23  Yes Nwoko, Uchenna E, PA  clonazePAM (KLONOPIN) 0.5 MG disintegrating tablet Take 1 tablet (0.5 mg total) by mouth 2 (two) times daily. 07/23/23  Yes Bender, Irving Burton, DO  doxepin (SINEQUAN) 75 MG capsule Take 1 capsule (75 mg total) by mouth at bedtime. 06/27/23  Yes Nwoko, Uchenna E, PA  escitalopram (LEXAPRO) 20 MG tablet Take 1 tablet (20 mg total) by mouth daily. 06/27/23  Yes Nwoko, Tommas Olp, PA  ezetimibe (ZETIA) 10 MG tablet Take 1 tablet (10 mg total) by mouth daily. Patient taking differently: Take 10 mg by mouth at bedtime. 07/04/23  Yes Meng, Wynema Birch, PA  Fluticasone-Umeclidin-Vilant (TRELEGY ELLIPTA) 200-62.5-25 MCG/ACT AEPB Inhale 1 puff into the lungs daily. 08/07/23  Yes Martina Sinner, MD  furosemide (LASIX) 20 MG tablet Take 1 tablet (20 mg total) by mouth daily. Patient taking differently: Take 20-40 mg by mouth daily. 09/18/22  Yes Monge, Petra Kuba, NP   gabapentin (NEURONTIN) 600 MG tablet TAKE 1 TABLET BY MOUTH 4 TIMES DAILY Patient taking differently: Take 600 mg by mouth 2 (two) times daily. 12/20/22  Yes [name], MD  ipratropium-albuterol (DUONEB) 0.5-2.5 (3) MG/3ML SOLN Take 3 mLs by nebulization every 6 (six) hours as needed. 07/23/23  Yes Faith Rogue, DO  levothyroxine (SYNTHROID) 112 MCG tablet Take 1 tablet (112 mcg total) by mouth daily. 12/20/22  Yes [name], MD  losartan (COZAAR) 50 MG tablet Take 1.5 tablets (75 mg total) by mouth daily. 09/18/22  Yes Monge, Petra Kuba, NP  metFORMIN (GLUCOPHAGE) 500 MG tablet Take 1 tablet (500 mg total) by mouth 2 (two) times daily with a meal. Patient taking differently: Take 1 tablet (500 mg total) by mouth 2 (two) times daily with a meal. 04/23/23  Yes [name], MD  mirtazapine (REMERON) 45 MG tablet Take 1 tablet (45 mg total) by mouth at bedtime. 06/27/23  Yes Nwoko, Uchenna E, PA  montelukast (SINGULAIR) 10 MG tablet Take 1 tablet (10 mg total) by mouth at bedtime. 12/20/22  Yes [name], MD  nebivolol (BYSTOLIC) 10 MG tablet Take 1 tablet (10 mg total) by mouth daily. Patient taking differently: Take 10 mg by mouth at bedtime. 09/18/22  Yes Monge, Petra Kuba, NP  nicotine (NICODERM CQ - DOSED IN MG/24 HOURS) 21 mg/24hr patch Place 1 patch (21 mg total) onto the skin daily. 09/06/23  Yes Jerald Kief, MD  nitroGLYCERIN (NITROSTAT) 0.4 MG SL tablet Place 1 tablet (0.4 mg total) under the tongue every 5 (five) minutes as needed for chest pain. 09/18/22 08/31/24 Yes Monge, Petra Kuba, NP  omeprazole (PRILOSEC) 40 MG capsule Take 1 capsule (40 mg total) by mouth 2 (two) times daily before a meal. 09/08/23  Yes Lemmon, Violet Baldy, PA  predniSONE (DELTASONE) 10 MG tablet Take 4 tablets (40 mg total) by  mouth daily for 3 days, THEN 2 tablets (20 mg total) daily for 3 days, THEN 1 tablet (10 mg total) daily for 3 days, THEN 0.5 tablets (5 mg total) daily for 2 days. 09/05/23  09/16/23 Yes Jerald Kief, MD  ticagrelor (BRILINTA) 60 MG TABS tablet Take 1 tablet (60 mg total) by mouth 2 (two) times daily. 09/18/22  Yes Monge, Petra Kuba, NP  Blood Glucose Monitoring Suppl (ACCU-CHEK GUIDE) w/Device KIT Use to check blood sugars once a day 09/05/23   [name], MD  glucose blood (TRUE METRIX BLOOD GLUCOSE TEST) test strip Use as instructed to check blood sugars once a day 09/05/23   [name], MD  Spacer/[facility] Use with the albuterol inhaler. 07/22/23   Marrianne Mood, MD  TRUEplus Lancets 28G MISC Use to check blood sugars once a day 09/05/23   [name], MD  Zoster Vaccine Adjuvanted [facility]) injection Inject 0.5 mLs into the muscle once for 1 dose. 05/20/23 05/20/23  [name], MD     Critical care time:    Care Time: 25 min Pulmonary will follow intermittently  Jahari Wiginton Mechele Collin, MD 09/19/23 12:48 PM [facility]  For contact information, see Amion. If no response to pager, please call PCCM consult pager. After hours, 7PM- 7AM, please call Elink.

## 2023-09-19 NOTE — Progress Notes (Signed)
 Progress Note     Subjective: Looks much better today.  Feeling better.  Down to 8L HFNC sating 98%.  Eating well.  Hasn't been out of bed yet.    Objective: Vital signs in last 24 hours: Temp:  [97.6 F (36.4 C)-98.9 F (37.2 C)] 97.6 F (36.4 C) (03/07 0335) Pulse Rate:  [65-89] 65 (03/07 0335) Resp:  [15-19] 19 (03/07 0335) BP: (86-109)/(48-79) 109/54 (03/07 0335) SpO2:  [94 %-100 %] 99 % (03/07 0335) FiO2 (%):  [45 %] 45 % (03/07 0244) Last BM Date : 09/16/23  Intake/Output from previous day: 03/06 0701 - 03/07 0700 In: 450 [P.O.:250; IV Piggyback:200] Out: 2562 [Urine:2550; Chest Tube:12] Intake/Output this shift: No intake/output data recorded.  PE: General: pleasant, WD, chronically ill appearing female, NAD Heart: regular, rate, and rhythm.   Lungs: wheezing present, but much less "wet" today and sounds better.  CT in place with no airleak today with cough or inspiration. Abd: soft, NT, ND Psych: A&Ox3 with an appropriate affect.    Lab Results:  Recent Labs    09/18/23 0324 09/19/23 0311  WBC 20.9* 14.1*  HGB 12.8 12.1  HCT 39.7 37.6  PLT 311 291   BMET Recent Labs    09/18/23 0324 09/19/23 0311  NA 134* 137  K 4.1 4.0  CL 99 106  CO2 23 27  GLUCOSE 168* 118*  BUN 22 26*  CREATININE 1.29* 1.00  CALCIUM 8.4* 8.3*   PT/INR No results for input(s): "LABPROT", "INR" in the last 72 hours. CMP     Component Value Date/Time   NA 137 09/19/2023 0311   NA 142 06/26/2023 1339   K 4.0 09/19/2023 0311   CL 106 09/19/2023 0311   CO2 27 09/19/2023 0311   GLUCOSE 118 (H) 09/19/2023 0311   BUN 26 (H) 09/19/2023 0311   BUN 14 06/26/2023 1339   CREATININE 1.00 09/19/2023 0311   CREATININE 0.80 03/24/2023 1232   CREATININE 0.93 04/16/2016 0937   CALCIUM 8.3 (L) 09/19/2023 0311   PROT 5.9 (L) 09/01/2023 0527   PROT 6.2 06/26/2023 1339   ALBUMIN 3.0 (L) 09/01/2023 0527   ALBUMIN 4.0 06/26/2023 1339   AST 16 09/01/2023 0527   AST 19 03/24/2023  1232   ALT 18 09/01/2023 0527   ALT 23 03/24/2023 1232   ALKPHOS 60 09/01/2023 0527   BILITOT 0.7 09/01/2023 0527   BILITOT 0.4 06/26/2023 1339   BILITOT 0.4 03/24/2023 1232   GFRNONAA >60 09/19/2023 0311   GFRNONAA >60 03/24/2023 1232   GFRNONAA 67 04/16/2016 0937   GFRAA 77 04/05/2019 0926   GFRAA 78 04/16/2016 0937   Lipase     Component Value Date/Time   LIPASE 23 02/11/2021 0416       Studies/Results: DG CHEST PORT 1 VIEW Result Date: 09/18/2023 CLINICAL DATA:  Right pneumothorax. EXAM: PORTABLE CHEST 1 VIEW COMPARISON:  Same day. FINDINGS: Right-sided chest tube is unchanged. Stable small right apical pneumothorax. Bibasilar opacities are again noted. Subcutaneous emphysema is again noted along right lateral chest wall. IMPRESSION: Stable small right apical pneumothorax. Stable position of right-sided chest tube. Electronically Signed   By: Lupita Raider M.D.   On: 09/18/2023 16:21   DG CHEST PORT 1 VIEW Result Date: 09/18/2023 CLINICAL DATA:  Pneumothorax. EXAM: PORTABLE CHEST 1 VIEW COMPARISON:  09/17/2023 FINDINGS: Interval increase in right apical pneumothorax with right pleural drain still in place. Interval increase in atelectasis at the right base. There is some atelectasis or infiltrate  at the left base with tiny left pleural effusion. Subcutaneous emphysema noted in the right lower neck and right chest wall. IMPRESSION: 1. Interval increase in right apical pneumothorax with right pleural drain still in place. 2. Interval increase in atelectasis at the right base. 3. Atelectasis or infiltrate at the left base with tiny left pleural effusion. Electronically Signed   By: Kennith Center M.D.   On: 09/18/2023 09:59   DG Chest Port 1 View Result Date: 09/17/2023 CLINICAL DATA:  16109. Acute respiratory distress. Right pneumothorax and rib fractures. EXAM: PORTABLE CHEST 1 VIEW COMPARISON:  Chest, abdomen and pelvis CT with contrast yesterday at 10:43 a.m., portable chest today at  6:08 a.m. FINDINGS: 4:59 p.m. Right chest drainage catheter with the pigtail superimposing in the mid perihilar area is similar in positioning. Increased right apical pneumothorax is seen today with 2 cm pleural-parenchymal separation and a small subpulmonic pneumothorax component new in the interval. The pneumothorax is approximately 10% of the chest volume previously 5%. There is no appreciable mediastinal shift. Small right pleural effusion continues to be noted. There is patchy increased right chest wall emphysema. No left pneumothorax. Atelectatic bands in both bases with no focal infiltrate being seen. The mediastinum is stable with aortic tortuosity and calcification. The cardiac size is normal. Right lateral fourth through ninth rib fractures are again noted, better demonstrated with CT. IMPRESSION: 1. Increased right apical pneumothorax with 2 cm pleural-parenchymal separation and a small subpulmonic pneumothorax component new in the interval. The pneumothorax is approximately 10% of the chest volume previously 5%. 2. Right chest drainage catheter similar in positioning. 3. Small right pleural effusion. 4. Patchy increased right chest wall emphysema. 5. Right lateral fourth through ninth rib fractures. Electronically Signed   By: Almira Bar M.D.   On: 09/17/2023 20:10    Anti-infectives: Anti-infectives (From admission, onward)    Start     Dose/Rate Route Frequency Ordered Stop   09/18/23 0430  ceFEPIme (MAXIPIME) 2 g in sodium chloride 0.9 % 100 mL IVPB        2 g 200 mL/hr over 30 Minutes Intravenous Every 12 hours 09/18/23 0424          Assessment/Plan  Fall out of bed R rib fractures 4-9 - pulm toilet, IS, pain control, mobilization R PTX - pigtail placed in trauma bay.  Follow up CXR this AM looks much better.  Cont on -40 today as this is the first day she has not had an airleak with good improvement on her CXR.  Repeat CXR in am Acute on Chronic respiratory failure on home  O2/COPD/ possible PNA - greatly appreciate CCM assistance with this patient.  Looks better today.  DNR/DNI determined yesterday.  Cont Cefepime for now.  Respiratory fx pending, gram + cocci, gram - rods. CAD/MI - continue Brilinta DM - SSI HTN - home meds HLD Depression/Anxiety Hypothyroidism - home meds Acute urinary retention - place foley today in setting of lasix use and retention on 3/6, leave today, but plan for removal and TOV 3/8 Hypotension - could be secondary to some lasix vs developing PNA.  improving.  Cont to monitor.  Home HTN meds have hold parameters FEN - carb mod diet VTE - Brilinta/Lovenox ID - Cefepime  DISPO - 4E, cont pulmonary care and CT care.  Greatly appreciate all consultants.  I have called her daughter today for an update.  LOS: 3 days   I reviewed Consultant pulmonology, palliative care notes, last 24 h vitals  and pain scores, last 48 h intake and output, last 24 h labs and trends, and last 24 h imaging results.   Letha Cape, [facility] 09/19/2023, 8:17 AM Please see Amion for pager number during day hours 7:00am-4:30pm

## 2023-09-20 ENCOUNTER — Inpatient Hospital Stay (HOSPITAL_COMMUNITY)

## 2023-09-20 DIAGNOSIS — S270XXA Traumatic pneumothorax, initial encounter: Secondary | ICD-10-CM | POA: Diagnosis not present

## 2023-09-20 DIAGNOSIS — J9621 Acute and chronic respiratory failure with hypoxia: Secondary | ICD-10-CM | POA: Diagnosis not present

## 2023-09-20 DIAGNOSIS — J441 Chronic obstructive pulmonary disease with (acute) exacerbation: Secondary | ICD-10-CM | POA: Diagnosis not present

## 2023-09-20 LAB — CBC
HCT: 37.1 % (ref 36.0–46.0)
Hemoglobin: 11.8 g/dL — ABNORMAL LOW (ref 12.0–15.0)
MCH: 31.6 pg (ref 26.0–34.0)
MCHC: 31.8 g/dL (ref 30.0–36.0)
MCV: 99.5 fL (ref 80.0–100.0)
Platelets: 348 10*3/uL (ref 150–400)
RBC: 3.73 MIL/uL — ABNORMAL LOW (ref 3.87–5.11)
RDW: 15.3 % (ref 11.5–15.5)
WBC: 13.9 10*3/uL — ABNORMAL HIGH (ref 4.0–10.5)
nRBC: 0 % (ref 0.0–0.2)

## 2023-09-20 LAB — GLUCOSE, CAPILLARY
Glucose-Capillary: 98 mg/dL (ref 70–99)
Glucose-Capillary: 91 mg/dL (ref 70–99)
Glucose-Capillary: 188 mg/dL — ABNORMAL HIGH (ref 70–99)
Glucose-Capillary: 306 mg/dL — ABNORMAL HIGH (ref 70–99)

## 2023-09-20 LAB — CULTURE, RESPIRATORY W GRAM STAIN

## 2023-09-20 MED ORDER — GUAIFENESIN-DM 100-10 MG/5ML PO SYRP
5.0000 mL | ORAL_SOLUTION | ORAL | Status: DC | PRN
  Administered 2023-09-20 – 2023-09-25 (×5): 5 mL via ORAL
  Filled 2023-09-20 (×6): qty 5

## 2023-09-20 MED ORDER — PREDNISONE 10 MG PO TABS: 10.0000 mg | ORAL_TABLET | Freq: Every day | ORAL | Status: DC

## 2023-09-20 MED ORDER — PREDNISONE 20 MG PO TABS
30.0000 mg | ORAL_TABLET | Freq: Every day | ORAL | Status: AC
  Administered 2023-09-21 – 2023-09-23 (×3): 30 mg via ORAL
  Filled 2023-09-20 (×3): qty 1

## 2023-09-20 MED ORDER — PREDNISONE 20 MG PO TABS
20.0000 mg | ORAL_TABLET | Freq: Every day | ORAL | Status: DC
  Administered 2023-09-24: 20 mg via ORAL
  Filled 2023-09-20: qty 1

## 2023-09-20 NOTE — Progress Notes (Addendum)
 Progress Note     Subjective: R rib pain and sob stable. On 3L o2. Tolerating po without n/v or abdominal pain. BM yesterday. Foley w/ good uop.   Afebrile. No tachycardia. BP 125/53. WBC 13.9 from 14.1. Resp cx w/ moderate serratia marcescens, susceptibility pending.   Objective: Vital signs in last 24 hours: Temp:  [98.1 F (36.7 C)-98.6 F (37 C)] 98.1 F (36.7 C) (03/08 0330) Pulse Rate:  [62-67] 64 (03/08 0330) Resp:  [11-18] 11 (03/08 0330) BP: (102-125)/(49-63) 125/53 (03/08 0330) SpO2:  [94 %-99 %] 99 % (03/08 0330) FiO2 (%):  [45 %] 45 % (03/07 0826) Last BM Date : 09/19/23  Intake/Output from previous day: 03/07 0701 - 03/08 0700 In: 1690 [P.O.:1390; IV Piggyback:300] Out: 1714 [Urine:1700; Chest Tube:14] Intake/Output this shift: No intake/output data recorded.  PE: General: pleasant, WD, chronically ill appearing female, NAD Heart: regular, rate, and rhythm.   Lungs:Rhonci and wheezing b/l. R CT in place with no airleak today with cough or inspiration. CT w/ 14cc/24 hours.  Abd: soft, NT, ND Msk: no le edema Psych: A&Ox3 with an appropriate affect.    Lab Results:  Recent Labs    09/19/23 0311 09/20/23 0334  WBC 14.1* 13.9*  HGB 12.1 11.8*  HCT 37.6 37.1  PLT 291 348   BMET Recent Labs    09/18/23 0324 09/19/23 0311  NA 134* 137  K 4.1 4.0  CL 99 106  CO2 23 27  GLUCOSE 168* 118*  BUN 22 26*  CREATININE 1.29* 1.00  CALCIUM 8.4* 8.3*   PT/INR No results for input(s): "LABPROT", "INR" in the last 72 hours. CMP     Component Value Date/Time   NA 137 09/19/2023 0311   NA 142 06/26/2023 1339   K 4.0 09/19/2023 0311   CL 106 09/19/2023 0311   CO2 27 09/19/2023 0311   GLUCOSE 118 (H) 09/19/2023 0311   BUN 26 (H) 09/19/2023 0311   BUN 14 06/26/2023 1339   CREATININE 1.00 09/19/2023 0311   CREATININE 0.80 03/24/2023 1232   CREATININE 0.93 04/16/2016 0937   CALCIUM 8.3 (L) 09/19/2023 0311   PROT 5.9 (L) 09/01/2023 0527   PROT 6.2  06/26/2023 1339   ALBUMIN 3.0 (L) 09/01/2023 0527   ALBUMIN 4.0 06/26/2023 1339   AST 16 09/01/2023 0527   AST 19 03/24/2023 1232   ALT 18 09/01/2023 0527   ALT 23 03/24/2023 1232   ALKPHOS 60 09/01/2023 0527   BILITOT 0.7 09/01/2023 0527   BILITOT 0.4 06/26/2023 1339   BILITOT 0.4 03/24/2023 1232   GFRNONAA >60 09/19/2023 0311   GFRNONAA >60 03/24/2023 1232   GFRNONAA 67 04/16/2016 0937   GFRAA 77 04/05/2019 0926   GFRAA 78 04/16/2016 0937   Lipase     Component Value Date/Time   LIPASE 23 02/11/2021 0416       Studies/Results: DG CHEST PORT 1 VIEW Result Date: 09/19/2023 CLINICAL DATA:  Follow up pneumothorax EXAM: PORTABLE CHEST 1 VIEW COMPARISON:  X-ray 09/18/2023 and older. FINDINGS: Stable right-sided pigtail catheter. Slight decrease in right lateral chest wall gas. Slight decrease in small right apical pneumothorax. Persistent lung base opacities, decreasing from previous. No left-sided pneumothorax. Stable cardiopericardial silhouette with calcified aorta. No edema. Old left clavicle fracture. Overlapping cardiac leads. IMPRESSION: Slight decrease in right apical pneumothorax with small residual. Decreasing chest wall gas. Decreasing lung base opacities. Electronically Signed   By: Karen Kays M.D.   On: 09/19/2023 10:17   DG CHEST PORT  1 VIEW Result Date: 09/18/2023 CLINICAL DATA:  Right pneumothorax. EXAM: PORTABLE CHEST 1 VIEW COMPARISON:  Same day. FINDINGS: Right-sided chest tube is unchanged. Stable small right apical pneumothorax. Bibasilar opacities are again noted. Subcutaneous emphysema is again noted along right lateral chest wall. IMPRESSION: Stable small right apical pneumothorax. Stable position of right-sided chest tube. Electronically Signed   By: Lupita Raider M.D.   On: 09/18/2023 16:21    Anti-infectives: Anti-infectives (From admission, onward)    Start     Dose/Rate Route Frequency Ordered Stop   09/18/23 0430  ceFEPIme (MAXIPIME) 2 g in sodium  chloride 0.9 % 100 mL IVPB        2 g 200 mL/hr over 30 Minutes Intravenous Every 12 hours 09/18/23 0424          Assessment/Plan Fall out of bed R rib fractures 4-9 - pulm toilet, IS, pain control, mobilization R PTX - pigtail placed in trauma bay.  On -40. Airleak resolved 3/7. Repeat CXR pending today  Acute on Chronic respiratory failure on home O2/COPD/ possible PNA - greatly appreciate CCM assistance with this patient.  DNR/DNI now. Nebs. Sulomedrol. Lasix. Mucinex. Cont Cefepime for now.  Resp cx w/ moderate serratia marcescens, susceptibility pending.  CAD/MI - continue Brilinta DM - SSI HTN - home meds HLD Depression/Anxiety Hypothyroidism - home meds Acute urinary retention - place foley today in setting of lasix use and retention on 3/6. TOV today.  Hypotension - could be secondary to some lasix vs developing PNA.  improving.  Cont to monitor.  Home HTN meds have hold parameters FEN - carb mod diet VTE - ASA/Brilinta/Lovenox ID - Cefepime  DISPO - 4E, cont pulmonary care and CT care.  Greatly appreciate all consultants.     LOS: 4 days   I reviewed Consultant pulmonology, palliative care notes, last 24 h vitals and pain scores, last 48 h intake and output, last 24 h labs and trends, and last 24 h imaging results.   Jacinto Halim, [facility] 09/20/2023, 7:59 AM Please see Amion for pager number during day hours 7:00am-4:30pm

## 2023-09-20 NOTE — Plan of Care (Signed)
  Problem: Fluid Volume: Goal: Ability to maintain a balanced intake and output will improve Outcome: Progressing   Problem: Metabolic: Goal: Ability to maintain appropriate glucose levels will improve Outcome: Progressing   Problem: Nutritional: Goal: Progress toward achieving an optimal weight will improve Outcome: Progressing   Problem: Skin Integrity: Goal: Risk for impaired skin integrity will decrease Outcome: Progressing   Problem: Education: Goal: Ability to describe self-care measures that may prevent or decrease complications (Diabetes Survival Skills Education) will improve Outcome: Not Progressing   Problem: Coping: Goal: Ability to adjust to condition or change in health will improve Outcome: Not Progressing   Problem: Health Behavior/Discharge Planning: Goal: Ability to manage health-related needs will improve Outcome: Not Progressing   Problem: Clinical Measurements: Goal: Respiratory complications will improve Outcome: Not Progressing   Problem: Activity: Goal: Risk for activity intolerance will decrease Outcome: Not Progressing   Problem: Coping: Goal: Level of anxiety will decrease Outcome: Not Progressing

## 2023-09-20 NOTE — Consult Note (Signed)
   NAME:  [name], MRN:  [id number], DOB:  01-31-1956, LOS: 4 ADMISSION DATE:  09/16/2023, CONSULTATION DATE:  3/6 REFERRING MD:  trauma service, CHIEF COMPLAINT:  hypoxia   History of Present Illness:  MS. [name] [name] is a 68 y/o woman with a history of COPD and chronic respiratory failure on 3L admitted on 3/4 after a fall out of bed causing right-sided rib fractures and R pneumothorax. In the last day she has had increasing oxygen requirements. She recently was admitted from 2/17- 2/21 for COPD AE and pneumonia. She has had 4 admissions for COPD since early December 2024. Since admission she has had escalating oxygen requirements. She was started on cefepime yesterday. She has had soft blood pressures. Since admission she has been on pulmicort, yupelri, brovana, prednisone 5mg  daily, and montelukast. PTA on trelegy, prednisone. Her main complaint is her pain from rib fractures.   Pertinent  Medical History  COPD Chronic respiratory failure Tobacco abuse Hypothyroidism HTN CAD w/ MI Pre-DM GERD Anxiety & depression  Significant Hospital Events: Including procedures, antibiotic start and stop dates in addition to other pertinent events     Interim History / Subjective:  Improved oxygenation to 4L O2 Improved airway clearance with current measures Objective   Blood pressure 117/60, pulse 67, temperature 98.4 F (36.9 C), temperature source Oral, resp. rate 18, height 4\' 9"  (1.448 m), weight 55.5 kg, SpO2 95%.        Intake/Output Summary (Last 24 hours) at 09/20/2023 1644 Last data filed at 09/20/2023 0600 Gross per 24 hour  Intake 950 ml  Output 1714 ml  Net -764 ml   Filed Weights   09/16/23 0903 09/17/23 0523 09/18/23 0338  Weight: 69.9 kg 68.4 kg 55.5 kg   Physical Exam: General: Chronically ill-appearing, no acute distress HENT: [place], AT Eyes: EOMI, no scleral icterus Respiratory: Diminished but clear to auscultation bilaterally.   Cardiovascular: RRR, -M/R/G, no  JVD Extremities:-Edema,-tenderness Neuro: AAO x4, CNII-XII grossly intact Psych: Normal mood, normal affect  CXR 09/19/23 Right apical pneumothorax with samll residual, improved chest wall gas and basilar opacities  WBC improved 13.9 Normalized AKI  Resolved Hospital Problem list     Assessment & Plan:   Fall causing rib fractures RLL pneumonia, presumed aspiration Acute on chronic respiratory failure with hypoxia - improving R traumatic pneumothorax COPD with acute exacerbation H/o tobacco use, ongoing -Pulmonary hygiene: triple nebs, metanebs, mucinex, flutter valve -Taper prednisone -pain control per primary -Recommend against BiPAP unless she needs it for WOB; it is not contraindicated with pneumothorax since she has a chest tube, but will delay resolution of pneumothorax. She would do poorly with mechanical ventilation, specifically weaning from MV. -sputum culture; agree with cefepime -Poor prognosis to have 4th hospitalization in 4 months. Confirmed limited MV if needed -Palliative care consulted -Tobacco cessation strongly encouraged -chest tube management per primary  Rest of care per primary.  Best Practice (right click and "Reselect all SmartList Selections" daily)   Per primary   Critical care time:    Care Time: 36 min Pulmonary will follow intermittently  Mechele Collin, M.D. [facility] Pulmonary/Critical Care Medicine 09/20/2023 4:44 PM   See Amion for personal pager For hours between 7 PM to 7 AM, please call Elink for urgent questions

## 2023-09-21 ENCOUNTER — Inpatient Hospital Stay (HOSPITAL_COMMUNITY)

## 2023-09-21 LAB — GLUCOSE, CAPILLARY
Glucose-Capillary: 86 mg/dL (ref 70–99)
Glucose-Capillary: 100 mg/dL — ABNORMAL HIGH (ref 70–99)
Glucose-Capillary: 265 mg/dL — ABNORMAL HIGH (ref 70–99)
Glucose-Capillary: 192 mg/dL — ABNORMAL HIGH (ref 70–99)

## 2023-09-21 MED ORDER — SODIUM CHLORIDE 0.9 % IV SOLN
2.0000 g | INTRAVENOUS | Status: DC
  Administered 2023-09-21 – 2023-09-23 (×3): 2 g via INTRAVENOUS
  Filled 2023-09-21 (×3): qty 20

## 2023-09-21 NOTE — Plan of Care (Signed)
  Problem: Metabolic: Goal: Ability to maintain appropriate glucose levels will improve Outcome: Not Progressing   Problem: Nutritional: Goal: Maintenance of adequate nutrition will improve Outcome: Progressing   Problem: Clinical Measurements: Goal: Respiratory complications will improve Outcome: Progressing

## 2023-09-21 NOTE — Progress Notes (Signed)
..  Trauma Event Note   Rounding Note:  Pt resting in room, per primary RN no immediate needs.   Last imported Vital Signs BP (!) 118/93 (BP Location: Left Arm)   Pulse 61   Temp 97.6 F (36.4 C) (Oral)   Resp 16   Ht 4\' 9"  (1.448 m)   Wt 122 lb 5.7 oz (55.5 kg)   SpO2 96%   BMI 26.48 kg/m   Trending CBC Recent Labs    09/19/23 0311 09/20/23 0334  WBC 14.1* 13.9*  HGB 12.1 11.8*  HCT 37.6 37.1  PLT 291 348    Trending Coag's No results for input(s): "APTT", "INR" in the last 72 hours.  Trending BMET Recent Labs    09/19/23 0311  NA 137  K 4.0  CL 106  CO2 27  BUN 26*  CREATININE 1.00  GLUCOSE 118*      Felipe Paluch Dee  Trauma Response RN  Please call TRN at [phone] for further assistance.

## 2023-09-21 NOTE — Progress Notes (Signed)
 Progress Note     Subjective: R rib pain and sob stable. On 4L o2. Tolerating po without n/v or abdominal pain. BM yesterday. Foley out and voiding.   Afebrile. No tachycardia. BP 128/66.   Objective: Vital signs in last 24 hours: Temp:  [97.6 F (36.4 C)-98.4 F (36.9 C)] 98.1 F (36.7 C) (03/09 0743) Pulse Rate:  [56-69] 56 (03/09 0743) Resp:  [15-18] 17 (03/09 0743) BP: (100-128)/(49-93) 128/66 (03/09 0743) SpO2:  [90 %-100 %] 90 % (03/09 0743) Last BM Date : 09/20/23  Intake/Output from previous day: 03/08 0701 - 03/09 0700 In: 680 [P.O.:480; IV Piggyback:200] Out: 82 [Chest Tube:82] Intake/Output this shift: No intake/output data recorded.  PE: General: pleasant, WD, chronically ill appearing female, NAD Heart: regular, rate, and rhythm.   Lungs: CTA b/l. R CT in place with no airleak today with cough or inspiration. CT w/ no I/O in the last 24 hours - cannister currently at 320cc total Abd: soft, NT, ND Msk: no le edema Psych: A&Ox3 with an appropriate affect.   Lab Results:  Recent Labs    09/19/23 0311 09/20/23 0334  WBC 14.1* 13.9*  HGB 12.1 11.8*  HCT 37.6 37.1  PLT 291 348   BMET Recent Labs    09/19/23 0311  NA 137  K 4.0  CL 106  CO2 27  GLUCOSE 118*  BUN 26*  CREATININE 1.00  CALCIUM 8.3*   PT/INR No results for input(s): "LABPROT", "INR" in the last 72 hours. CMP     Component Value Date/Time   NA 137 09/19/2023 0311   NA 142 06/26/2023 1339   K 4.0 09/19/2023 0311   CL 106 09/19/2023 0311   CO2 27 09/19/2023 0311   GLUCOSE 118 (H) 09/19/2023 0311   BUN 26 (H) 09/19/2023 0311   BUN 14 06/26/2023 1339   CREATININE 1.00 09/19/2023 0311   CREATININE 0.80 03/24/2023 1232   CREATININE 0.93 04/16/2016 0937   CALCIUM 8.3 (L) 09/19/2023 0311   PROT 5.9 (L) 09/01/2023 0527   PROT 6.2 06/26/2023 1339   ALBUMIN 3.0 (L) 09/01/2023 0527   ALBUMIN 4.0 06/26/2023 1339   AST 16 09/01/2023 0527   AST 19 03/24/2023 1232   ALT 18  09/01/2023 0527   ALT 23 03/24/2023 1232   ALKPHOS 60 09/01/2023 0527   BILITOT 0.7 09/01/2023 0527   BILITOT 0.4 06/26/2023 1339   BILITOT 0.4 03/24/2023 1232   GFRNONAA >60 09/19/2023 0311   GFRNONAA >60 03/24/2023 1232   GFRNONAA 67 04/16/2016 0937   GFRAA 77 04/05/2019 0926   GFRAA 78 04/16/2016 0937   Lipase     Component Value Date/Time   LIPASE 23 02/11/2021 0416       Studies/Results: DG CHEST PORT 1 VIEW Result Date: 09/20/2023 CLINICAL DATA:  [id number] Pneumothorax, right [id number] EXAM: PORTABLE CHEST 1 VIEW COMPARISON:  Chest radiograph from one day prior. FINDINGS: Stable a pigtail right mid chest tube. Similar subcutaneous emphysema in the lateral right chest wall. Stable cardiomediastinal silhouette with normal heart size. Tiny right apical pneumothorax, unchanged. No left pneumothorax. Stable trace left pleural effusion. No significant right pleural effusion. Slightly low lung volumes with mild to moderate patchy bibasilar lung opacities, unchanged. IMPRESSION: 1. Stable tiny right apical pneumothorax with right chest tube in place. 2. Stable trace left pleural effusion. 3. Slightly low lung volumes with mild to moderate patchy bibasilar lung opacities, unchanged. Electronically Signed   By: Delbert Phenix M.D.   On: 09/20/2023 10:04  Anti-infectives: Anti-infectives (From admission, onward)    Start     Dose/Rate Route Frequency Ordered Stop   09/18/23 0430  ceFEPIme (MAXIPIME) 2 g in sodium chloride 0.9 % 100 mL IVPB        2 g 200 mL/hr over 30 Minutes Intravenous Every 12 hours 09/18/23 0424          Assessment/Plan Fall out of bed R rib fractures 4-9 - pulm toilet, IS, pain control, mobilization R PTX - pigtail placed in trauma bay.  On -40. Airleak resolved 3/7. Repeat CXR pending today but looks stable on my read - if stable on rads read, plan to dec to -20. AM CXR.  Acute on Chronic respiratory failure on home O2/COPD/ possible PNA - greatly appreciate CCM  assistance with this patient.  DNR/DNI now. Nebs. Sulomedrol. Lasix. Mucinex. Resp cx w/ moderate serratia marcescens- Cont Cefepime.   CAD/MI - continue Brilinta DM - SSI HTN - home meds HLD Depression/Anxiety Hypothyroidism - home meds Acute urinary retention - place foley today in setting of lasix use and retention on 3/6. Out 3/8 and voiding.  Hypotension - could be secondary to some lasix vs developing PNA.  improving.  Cont to monitor.  Home HTN meds have hold parameters FEN - carb mod diet VTE - ASA/Brilinta/Lovenox ID - Cefepime  DISPO - 4E, cont pulmonary care and CT care.  Greatly appreciate all consultants.     LOS: 5 days   I reviewed Consultant pulmonology, palliative care notes, last 24 h vitals and pain scores, last 48 h intake and output, last 24 h labs and trends, and last 24 h imaging results.   Jacinto Halim, [facility] Surgery 09/21/2023, 8:23 AM Please see Amion for pager number during day hours 7:00am-4:30pm

## 2023-09-22 ENCOUNTER — Other Ambulatory Visit: Payer: Self-pay | Admitting: Nurse Practitioner

## 2023-09-22 ENCOUNTER — Inpatient Hospital Stay (HOSPITAL_COMMUNITY)

## 2023-09-22 ENCOUNTER — Other Ambulatory Visit: Payer: Self-pay

## 2023-09-22 ENCOUNTER — Other Ambulatory Visit: Payer: Self-pay | Admitting: Internal Medicine

## 2023-09-22 DIAGNOSIS — J4489 Other specified chronic obstructive pulmonary disease: Secondary | ICD-10-CM

## 2023-09-22 DIAGNOSIS — J441 Chronic obstructive pulmonary disease with (acute) exacerbation: Secondary | ICD-10-CM | POA: Diagnosis not present

## 2023-09-22 DIAGNOSIS — S270XXA Traumatic pneumothorax, initial encounter: Secondary | ICD-10-CM | POA: Diagnosis not present

## 2023-09-22 DIAGNOSIS — Z515 Encounter for palliative care: Secondary | ICD-10-CM | POA: Diagnosis not present

## 2023-09-22 DIAGNOSIS — R52 Pain, unspecified: Secondary | ICD-10-CM

## 2023-09-22 DIAGNOSIS — E785 Hyperlipidemia, unspecified: Secondary | ICD-10-CM

## 2023-09-22 DIAGNOSIS — W19XXXA Unspecified fall, initial encounter: Secondary | ICD-10-CM | POA: Diagnosis not present

## 2023-09-22 DIAGNOSIS — J9621 Acute and chronic respiratory failure with hypoxia: Secondary | ICD-10-CM | POA: Diagnosis not present

## 2023-09-22 DIAGNOSIS — Z789 Other specified health status: Secondary | ICD-10-CM

## 2023-09-22 DIAGNOSIS — I10 Essential (primary) hypertension: Secondary | ICD-10-CM

## 2023-09-22 DIAGNOSIS — I2510 Atherosclerotic heart disease of native coronary artery without angina pectoris: Secondary | ICD-10-CM

## 2023-09-22 DIAGNOSIS — Z711 Person with feared health complaint in whom no diagnosis is made: Secondary | ICD-10-CM

## 2023-09-22 DIAGNOSIS — S2241XA Multiple fractures of ribs, right side, initial encounter for closed fracture: Secondary | ICD-10-CM | POA: Diagnosis not present

## 2023-09-22 LAB — GLUCOSE, CAPILLARY
Glucose-Capillary: 131 mg/dL — ABNORMAL HIGH (ref 70–99)
Glucose-Capillary: 130 mg/dL — ABNORMAL HIGH (ref 70–99)
Glucose-Capillary: 267 mg/dL — ABNORMAL HIGH (ref 70–99)
Glucose-Capillary: 148 mg/dL — ABNORMAL HIGH (ref 70–99)

## 2023-09-22 MED ORDER — FUROSEMIDE 20 MG PO TABS
20.0000 mg | ORAL_TABLET | Freq: Every day | ORAL | 3 refills | Status: DC
  Filled 2023-09-22: qty 90, 90d supply, fill #0

## 2023-09-22 MED ORDER — AMLODIPINE BESYLATE 5 MG PO TABS
5.0000 mg | ORAL_TABLET | Freq: Every day | ORAL | 3 refills | Status: DC
  Filled 2023-09-22: qty 90, 90d supply, fill #0

## 2023-09-22 MED ORDER — ATORVASTATIN CALCIUM 80 MG PO TABS
80.0000 mg | ORAL_TABLET | Freq: Every day | ORAL | 3 refills | Status: DC
  Filled 2023-09-22: qty 90, 90d supply, fill #0
  Filled 2023-12-22: qty 90, 90d supply, fill #1
  Filled 2024-03-20: qty 90, 90d supply, fill #2
  Filled 2024-06-13: qty 90, 90d supply, fill #3

## 2023-09-22 MED ORDER — SODIUM CHLORIDE 0.9% FLUSH
10.0000 mL | Freq: Two times a day (BID) | INTRAVENOUS | Status: DC
  Administered 2023-09-22 – 2023-09-25 (×6): 10 mL

## 2023-09-22 MED ORDER — POLYETHYLENE GLYCOL 3350 17 G PO PACK
17.0000 g | PACK | Freq: Every day | ORAL | Status: DC
  Administered 2023-09-22 – 2023-09-24 (×3): 17 g via ORAL
  Filled 2023-09-22 (×3): qty 1

## 2023-09-22 MED ORDER — SODIUM CHLORIDE 0.9% FLUSH: 10.0000 mL | INTRAVENOUS | Status: DC | PRN

## 2023-09-22 MED ORDER — NEBIVOLOL HCL 10 MG PO TABS
10.0000 mg | ORAL_TABLET | Freq: Every day | ORAL | 3 refills | Status: AC
  Filled 2023-09-22: qty 90, 90d supply, fill #0
  Filled 2023-12-22: qty 90, 90d supply, fill #1
  Filled 2024-03-20: qty 90, 90d supply, fill #2
  Filled 2024-06-13: qty 90, 90d supply, fill #3

## 2023-09-22 MED ORDER — BISACODYL 10 MG RE SUPP: 10.0000 mg | Freq: Every day | RECTAL | Status: DC | PRN

## 2023-09-22 NOTE — Progress Notes (Signed)
 NAME:  [name] [name], MRN:  [id number], DOB:  Nov 29, 1955, LOS: 6 ADMISSION DATE:  09/16/2023, CONSULTATION DATE:  3/6 REFERRING MD:  trauma service, CHIEF COMPLAINT:  hypoxia   History of Present Illness:  MS. Barz is a 68 y/o woman with a history of COPD and chronic respiratory failure on 3L admitted on 3/4 after a fall out of bed causing right-sided rib fractures and R pneumothorax. In the last day she has had increasing oxygen requirements. She recently was admitted from 2/17- 2/21 for COPD AE and pneumonia. She has had 4 admissions for COPD since early December 2024. Since admission she has had escalating oxygen requirements. She was started on cefepime yesterday. She has had soft blood pressures. Since admission she has been on pulmicort, yupelri, brovana, prednisone 5mg  daily, and montelukast. PTA on trelegy, prednisone. Her main complaint is her pain from rib fractures.   Pertinent  Medical History  COPD Chronic respiratory failure Tobacco abuse Hypothyroidism HTN CAD w/ MI Pre-DM GERD Anxiety & depression  Significant Hospital Events: Including procedures, antibiotic start and stop dates in addition to other pertinent events   09/16/2023 Admit  Interim History / Subjective:   States that breathing is stable.  No acute events.  Objective   Blood pressure (!) 118/51, pulse 66, temperature 98.2 F (36.8 C), temperature source Oral, resp. rate 15, height 4\' 9"  (1.448 m), weight 55.5 kg, SpO2 94%.        Intake/Output Summary (Last 24 hours) at 09/22/2023 1230 Last data filed at 09/22/2023 0400 Gross per 24 hour  Intake 580 ml  Output 320 ml  Net 260 ml   Filed Weights   09/16/23 0903 09/17/23 0523 09/18/23 0338  Weight: 69.9 kg 68.4 kg 55.5 kg   Physical Exam: Blood pressure (!) 118/51, pulse 66, temperature 98.2 F (36.8 C), temperature source Oral, resp. rate 15, height 4\' 9"  (1.448 m), weight 55.5 kg, SpO2 94%. Gen:      No acute distress HEENT:  EOMI, sclera  anicteric Neck:     No masses; no thyromegaly Lungs:   Diminished air entry CV:         Regular rate and rhythm; no murmurs Abd:      + bowel sounds; soft, non-tender; no palpable masses, no distension Ext:    No edema; adequate peripheral perfusion Skin:      Warm and dry; no rash Neuro: alert and oriented x 3 Psych: normal mood and affect   No new labs Chest x-ray shows chest tube in place with no pneumothorax, bibasal atelectasis  Resolved Hospital Problem list     Assessment & Plan:   Fall causing rib fractures RLL pneumonia, presumed aspiration Acute on chronic respiratory failure with hypoxia - improving R traumatic pneumothorax COPD with acute exacerbation H/o tobacco use, ongoing  Continue triple nebulizer therapy, mucociliary clearance with Mucinex, flutter valve Continue prednisone taper Recommend against BiPAP unless she needs it for WOB; it is not contraindicated with pneumothorax since she has a chest tube, but will delay resolution of pneumothorax. She would do poorly with mechanical ventilation, specifically weaning from MV. -Continue ceftriaxone for pneumonia -Poor prognosis to have 4th hospitalization in 4 months. Confirmed limited MV if needed -Agree with palliative care consult -Tobacco cessation strongly encouraged -chest tube management per primary  Rest of care per primary.  Best Practice (right click and "Reselect all SmartList Selections" daily)   Per primary   Critical care time: NA   Chilton Greathouse MD [facility] &  Critical care See Amion for pager  If no response to pager , please call [phone] until 7pm After 7:00 pm call Elink  [phone] 09/22/2023, 1:23 PM

## 2023-09-22 NOTE — Progress Notes (Signed)
 CT to water seal per secured message from Malva Cogan, [place]

## 2023-09-22 NOTE — Plan of Care (Signed)

## 2023-09-22 NOTE — Progress Notes (Signed)
 Daily Progress Note   Patient Name: [name] [name]       Date: 09/22/2023 DOB: Jul 20, 1955  Age: 68 y.o. MRN#: [id number] Attending Physician: Roslynn Amble, MD Primary Care Physician: Martina Sinner, MD Admit Date: 09/16/2023  Reason for Consultation/Follow-up: Establishing goals of care  Subjective: I have reviewed medical records including EPIC notes, MAR, any available advanced directives as necessary, and labs. Received report from primary RN - no acute concerns.   Went to visit patient at bedside - no family/visitors present. Patient was sitting up in chair awake, alert, oriented, and able to participate in conversation. No respiratory distress or increased work of breathing; congested cough/secretions noted. She is on her baseline 2L O2 [place]. She reports "10/12" pain on her right side/rib fracture location. Per patient she has just finished working with PT/OT.   Emotional support provided to patient. Therapeutic listening provided as she reflects on knowing she is "still in bad shape" and "not moving well at all because of soreness." Detailed discussion was had in regards to her pain - she is already on scheduled tylenol as well as PRN oxycodone. Per patient, oxycodone helps with her pain when administered. Her last dose today was at 0432 - called and requested RN provide next dose. Explored concept of pre-medicating for pain prior to PT/OT sessions to assist with adequate pain management for before/during/after therapy sessions.  Reviewed discharge goals with patient - she wants to maintain her independence as long as she can. She is interested in continued PT/OT. Reviewed HH vs rehab. Per previous notes, patient indicated family could provide 24/7 supervision/support at home; however, after further  discussion, patient states that her daughter works during the day. Expressed concern that patient will need more assistance than can be provided at her daughter's home - she agrees. She is open for discharge to rehab and tells me "I want to get better."  Patient is ok with me calling to discuss with her daughter today. Patient is also open to outpatient Palliative Care following at discharge.  10:30 AM Called daughter/Tiffiney - emotional support provided. Provided updates on the conversation as noted above. She confirms she does not feel comfortable with patient return to her home at this time and validates she works during the day - she would prefer patient's discharge to rehab until she can  get stronger.   Pain management regimen reviewed per her request.  Tiffiney asks, "what is the status of her respiratory issues." Provided updates that acutely, patient does seem to be improving. Current medical updates provided. Also discussed that patient is at high risk for rehospitalizations. Expressed concern that patient's overall long term prognosis is poor. Long term trajectory reviewed. Tiffiney is not surprised to hear this. She tells me, "I don't think she realizes yet this might be the beginning of the end." Validated that these were also my concerns - explained these were conversations that will be important to have in the coming days with patient in regards to goals for rehospitalization and when hospice would be appropriate. Tiffiney is available by phone tomorrow to participate in these discussions with patient.   All questions and concerns addressed. Encouraged to call with questions and/or concerns. PMT number previously provided.  PT/OT and TOC updated on goals for SNF rehab and not HH.  Length of Stay: 6  Current Medications: Scheduled Meds:   acetaminophen  1,000 mg Oral Q6H   amLODipine  5 mg Oral Daily   arformoterol  15 mcg Nebulization BID   aspirin EC  81 mg Oral Daily    atorvastatin  80 mg Oral Daily   budesonide (PULMICORT) nebulizer solution  0.5 mg Nebulization BID   busPIRone  30 mg Oral q AM   And   busPIRone  15 mg Oral Q1200   And   busPIRone  15 mg Oral QPM   clonazePAM  0.5 mg Oral BID   docusate sodium  100 mg Oral BID   doxepin  75 mg Oral QHS   enoxaparin (LOVENOX) injection  30 mg Subcutaneous Q12H   escitalopram  20 mg Oral Daily   ezetimibe  10 mg Oral QHS   furosemide  20 mg Oral Daily   gabapentin  600 mg Oral BID   guaiFENesin  600 mg Oral BID   insulin aspart  0-15 Units Subcutaneous TID WC   insulin aspart  0-5 Units Subcutaneous QHS   levothyroxine  112 mcg Oral Daily   lidocaine  1 patch Transdermal Q24H   losartan  75 mg Oral Daily   mirtazapine  45 mg Oral QHS   montelukast  10 mg Oral QHS   nebivolol  10 mg Oral QHS   nicotine  21 mg Transdermal Daily   polyethylene glycol  17 g Oral Daily   predniSONE  30 mg Oral Q breakfast   Followed by   Melene Muller ON 09/24/2023] predniSONE  20 mg Oral Q breakfast   Followed by   Melene Muller ON 09/27/2023] predniSONE  10 mg Oral Q breakfast   revefenacin  175 mcg Nebulization Daily   ticagrelor  60 mg Oral BID    Continuous Infusions:  cefTRIAXone (ROCEPHIN)  IV 2 g (09/21/23 1148)    PRN Meds: albuterol, bisacodyl, guaiFENesin-dextromethorphan, hydrALAZINE, HYDROmorphone (DILAUDID) injection, metoprolol tartrate, oxyCODONE, prochlorperazine  Physical Exam Vitals and nursing note reviewed.  Constitutional:      General: She is not in acute distress.    Appearance: She is ill-appearing.  Pulmonary:     Effort: No respiratory distress.  Skin:    General: Skin is warm and dry.  Neurological:     Mental Status: She is alert and oriented to person, place, and time.     Motor: Weakness present.  Psychiatric:        Attention and Perception: Attention normal.  Behavior: Behavior is cooperative.        Cognition and Memory: Cognition and memory normal.             Vital  Signs: BP (!) 113/56 (BP Location: Right Arm)   Pulse (!) 56   Temp 97.7 F (36.5 C) (Oral)   Resp 15   Ht 4\' 9"  (1.448 m)   Wt 55.5 kg   SpO2 97%   BMI 26.48 kg/m  SpO2: SpO2: 97 % O2 Device: O2 Device: Nasal Cannula O2 Flow Rate: O2 Flow Rate (L/min): 2 L/min  Intake/output summary:  Intake/Output Summary (Last 24 hours) at 09/22/2023 1012 Last data filed at 09/22/2023 0400 Gross per 24 hour  Intake 580 ml  Output 320 ml  Net 260 ml   LBM: Last BM Date : 09/20/23 Baseline Weight: Weight: 69.9 kg Most recent weight: Weight: 55.5 kg        Palliative Assessment/Data: PPS 50-60%      Patient Active Problem List   Diagnosis Date Noted   Pneumothorax, right 09/16/2023   COPD with acute exacerbation (HCC) 09/01/2023   Chronic hypoxic respiratory failure (HCC) 09/01/2023   Continuous dependence on cigarette smoking 09/01/2023   History of CAD (coronary artery disease) 09/01/2023   CAP (community acquired pneumonia) 09/01/2023   Infection due to parainfluenza virus 4 06/20/2023   Full code status 06/14/2023   Acute hypoxic respiratory failure (HCC) 06/13/2023   Acute exacerbation of chronic obstructive pulmonary disease (COPD) (HCC) 06/12/2023   Nicotine dependence 05/03/2023   Substance abuse (HCC) 09/10/2022   Iron deficiency anemia 11/02/2021   Major depressive disorder, recurrent, in full remission with anxious distress (HCC) 05/31/2021   Chronic neck pain 02/26/2021   GAD (generalized anxiety disorder) 01/08/2021   Esophageal dysphagia 04/28/2019   Bipolar I disorder (HCC) 09/17/2018   Insomnia 10/28/2017   Prediabetes 05/28/2017   QT prolongation 03/25/2017   Psoriasis 06/22/2015   COPD (chronic obstructive pulmonary disease) (HCC)GOLD E 05/18/2015   Anxiety and depression 07/10/2013   Essential hypertension 06/07/2013   Hyperlipidemia 06/07/2013   Asthma, chronic 06/07/2013   Emphysema lung (HCC) 06/07/2013   Chronic back pain 06/07/2013   CAD S/P  percutaneous coronary angioplasty 06/07/2013   GERD (gastroesophageal reflux disease) 06/07/2013   Breast lump on left side at 3 o'clock position 10/13/2012   S/P abdominal hysterectomy and right salpingo-oophorectomy 08/29/2011   COPD exacerbation (HCC) 09/15/2009   TOBACCO ABUSE 07/17/2009   Chronic rhinitis 07/17/2009   Lung nodule < 6cm on CT 07/17/2009   Hypothyroidism 12/22/2008   Chronic depression 12/22/2008    Palliative Care Assessment & Plan   Patient Profile: 68 y.o. female  with past medical history of chronic respiratory failure on 2L O2 [place] at home, COPD with recent exacerbation/multiple hospitalizations, HTN, HLD, CAD/MI with stents on plavix and ASA, DM, depression/anxiety, and hypothyroidism  admitted on 09/16/2023 with right-sided rib fractures and a R PTX. Chest tube placed. Also treating for possible pneumonia. PMT consulted to discuss GOC.   Of note, she has had 5 admissions and 1 ED visit in the last 6 months.   Assessment: Principal Problem:   Pneumothorax, right   Concern about end of life  Recommendations/Plan: Continue to treat the treatable Continue DNR/DNI as previously documented Goal is for discharge to SNF rehab with outpatient Palliative Care to follow Ongoing GOC to discuss MOST form and hospice care planned for tomorrow with patient and her daughter [facility] notified and consulted for: outpatient Palliative Care to  follow PMT will continue to follow and support holistically  Goals of Care and Additional Recommendations: Limitations on Scope of Treatment: Full Scope Treatment  Code Status:    Code Status Orders  (From admission, onward)           Start     Ordered   09/18/23 1613  Do not attempt resuscitation (DNR)- Limited -Do Not Intubate (DNI)  (Code Status)  Continuous       Question Answer Comment  If pulseless and not breathing No CPR or chest compressions.   In Pre-Arrest Conditions (Patient Is Breathing and Has A Pulse) Do not  intubate. Provide all appropriate non-invasive medical interventions. Avoid ICU transfer unless indicated or required.   Consent: Discussion documented in EHR or advanced directives reviewed      09/18/23 1612           Code Status History     Date Active Date Inactive Code Status Order ID Comments User Context   09/16/2023 1333 09/18/2023 1612 Full Code [id number]  Violeta Gelinas, MD ED   09/01/2023 0051 09/05/2023 2311 Full Code [id number]  Tereasa Coop, MD ED   07/21/2023 2105 07/23/2023 1856 Full Code [id number]  Modena Slater, DO ED   07/12/2023 0943 07/15/2023 1649 Full Code [id number]  Reymundo Poll, MD ED   06/12/2023 1008 06/20/2023 2233 Full Code [id number]  Masters, Katie, DO ED   09/15/2017 1951 09/16/2017 2159 Full Code [id number]  Nyra Market, MD ED   03/25/2017 1459 03/27/2017 1726 Full Code [id number]  Ozella Rocks, MD ED   03/25/2017 1459 03/25/2017 1459 Full Code [id number]  Ozella Rocks, MD ED   10/26/2015 2303 10/30/2015 1500 Full Code [id number]  Alberteen Sam, MD Inpatient   07/09/2013 1614 07/10/2013 0339 Full Code [id number]  Arthor Captain, PA-C ED      Advance Directive Documentation    Flowsheet Row Most Recent Value  Type of Advance Directive Healthcare Power of Attorney, Living will  Pre-existing out of facility DNR order (yellow form or pink MOST form) --  "MOST" Form in Place? --       Prognosis:  Long term poor  Discharge Planning: Skilled Nursing Facility for rehab with Palliative care service follow-up  Care plan was discussed with primary RN, patient, patient's daughter, TOC, PT, OT  Thank you for allowing the Palliative Medicine Team to assist in the care of this patient.   Total Time 55 minutes Prolonged Time Billed  no       Haskel Khan, NP  Please contact Palliative Medicine Team phone at [phone] for questions and concerns.   *Portions of this note are a verbal dictation therefore any spelling and/or grammatical errors  are due to the "[employer]" system interpretation.

## 2023-09-22 NOTE — Plan of Care (Signed)
  Problem: Skin Integrity: Goal: Risk for impaired skin integrity will decrease Outcome: Progressing   Problem: Education: Goal: Knowledge of General Education information will improve Description: Including pain rating scale, medication(s)/side effects and non-pharmacologic comfort measures Outcome: Progressing   Problem: Clinical Measurements: Goal: Respiratory complications will improve Outcome: Progressing   Problem: Activity: Goal: Risk for activity intolerance will decrease Outcome: Progressing

## 2023-09-22 NOTE — Progress Notes (Signed)
 Physical Therapy Treatment Patient Details Name: [name] [name] MRN: [id number] DOB: 09-Oct-1955 Today's Date: 09/22/2023   History of Present Illness 68 y.o. female presents to [facility] 09/16/23 after falling out of bed with R sided rib fxs 4-9 and R PTX. Prior admit 08/31/23 w/ acute COPD exacerbation and Aspiration PNA. PMH: HTN, CAD with multiple DES, DM2, COPD, anxiety, hypothyroidism, RTC repair 12/24/2021, ongoing tobacco abuse.    PT Comments  Pt resting in bed on arrival and agreeable to session with steady progress towards acute goals. Pt continues to be limited in safe mobility by R rib/flank pain, decreased activity tolerance, weakness and poor balance/postural reactions. Pt able to come to sitting EOB with CGA with HOB elevated significantly and boost to stand with CGA for safety and RW for support. Pt able to maintain standing ~10 mins and perform marching x3 sets of 30 seconds with standing rest between bouts. Pt verbalizing understanding of education on importance of continued mobility and time up OOB.  Patient will benefit from continued inpatient follow up therapy, <3 hours/day as pt daughter unable to provide 24/7 assist. Pt continues to benefit from skilled PT services to progress toward functional mobility goals.     If plan is discharge home, recommend the following: Assist for transportation;Assistance with cooking/housework;A little help with walking and/or transfers;Help with stairs or ramp for entrance   Can travel by private vehicle     No  Equipment Recommendations  None recommended by PT    Recommendations for Other Services       Precautions / Restrictions Precautions Precautions: Other (comment);Fall Recall of Precautions/Restrictions: Intact Precaution/Restrictions Comments: watch SpO2, chest tube Restrictions Weight Bearing Restrictions Per Provider Order: No     Mobility  Bed Mobility Overal bed mobility: Needs Assistance Bed Mobility: Rolling, Sidelying to  Sit Rolling: Supervision Sidelying to sit: HOB elevated, Contact guard assist     Sit to sidelying: Min assist General bed mobility comments: increased time to come to sitting EOB, min A to return LEs to bed    Transfers Overall transfer level: Needs assistance Equipment used: Rolling walker (2 wheels) Transfers: Sit to/from Stand, Bed to chair/wheelchair/BSC Sit to Stand: Contact guard assist           General transfer comment: CGA for safety    Ambulation/Gait               General Gait Details: limited/deferred due to chest tube on suction, able to maintain standing ~10 mins and perform x3 30seconds marching   Stairs             Wheelchair Mobility     Tilt Bed    Modified Rankin (Stroke Patients Only)       Balance Overall balance assessment: Needs assistance Sitting-balance support: No upper extremity supported, Feet supported Sitting balance-Leahy Scale: Good Sitting balance - Comments: EOB   Standing balance support: Single extremity supported, Bilateral upper extremity supported, During functional activity Standing balance-Leahy Scale: Fair Standing balance comment: reliant on external support for standing balance                            Communication Communication Communication: No apparent difficulties  Cognition Arousal: Alert Behavior During Therapy: WFL for tasks assessed/performed   PT - Cognitive impairments: No apparent impairments                         Following commands: Intact  Cueing Cueing Techniques: Verbal cues  Exercises Other Exercises Other Exercises: standing marching x3 30 seconds each with standing rest break between    General Comments General comments (skin integrity, edema, etc.): VSS on supplemental O2      Pertinent Vitals/Pain Pain Assessment Pain Assessment: Faces Faces Pain Scale: Hurts little more Pain Location: R ribs Pain Descriptors / Indicators: Aching,  Discomfort Pain Intervention(s): Monitored during session, Limited activity within patient's tolerance    Home Living                          Prior Function            PT Goals (current goals can now be found in the care plan section) Acute Rehab PT Goals PT Goal Formulation: With patient Time For Goal Achievement: 10/01/23 Progress towards PT goals: Progressing toward goals    Frequency    Min 3X/week      PT Plan      Co-evaluation              AM-PAC PT "6 Clicks" Mobility   Outcome Measure  Help needed turning from your back to your side while in a flat bed without using bedrails?: A Little Help needed moving from lying on your back to sitting on the side of a flat bed without using bedrails?: A Little Help needed moving to and from a bed to a chair (including a wheelchair)?: A Little Help needed standing up from a chair using your arms (e.g., wheelchair or bedside chair)?: A Little Help needed to walk in hospital room?: Total Help needed climbing 3-5 steps with a railing? : Total 6 Click Score: 14    End of Session Equipment Utilized During Treatment: Oxygen Activity Tolerance: Patient limited by fatigue;Patient tolerated treatment well Patient left: in bed;with call bell/phone within reach Nurse Communication: Mobility status PT Visit Diagnosis: Muscle weakness (generalized) (M62.81);Unsteadiness on feet (R26.81)     Time: 1610-9604 PT Time Calculation (min) (ACUTE ONLY): 23 min  Charges:    $Therapeutic Exercise: 8-22 mins $Therapeutic Activity: 8-22 mins PT General Charges $$ ACUTE PT VISIT: 1 Visit                     Travaughn Vue R. PTA Acute Rehabilitation Services Office: [phone]   Catalina Antigua 09/22/2023, 4:00 PM

## 2023-09-22 NOTE — Progress Notes (Addendum)
 Occupational Therapy Treatment Patient Details Name: [name] [name] MRN: [id number] DOB: 05-08-1956 Today's Date: 09/22/2023   History of present illness 68 y.o. female presents to [facility] 09/16/23 after falling out of bed with R sided rib fxs 4-9 and R PTX. Prior admit 08/31/23 w/ acute COPD exacerbation and Aspiration PNA. PMH: HTN, CAD with multiple DES, DM2, COPD, anxiety, hypothyroidism, RTC repair 12/24/2021, ongoing tobacco abuse.   OT comments  Patient demonstrating good gains with OT treatment with min assist for bed mobility and CGA for transfers. Patient able to tolerate standing at sink for grooming tasks but performed UB seated due to fatigue. Patient with increased right rib pain during session, nursing notified. Patient will benefit from continued inpatient follow up therapy, <3 hours/day due to daughter will not be able to provided 24/7 assistance following discharge. Acute OT to continue to follow to address established goals to facilitate DC to next venue of care.       If plan is discharge home, recommend the following:  A little help with walking and/or transfers;A little help with bathing/dressing/bathroom;Assistance with cooking/housework;Assist for transportation;Help with stairs or ramp for entrance   Equipment Recommendations  None recommended by OT    Recommendations for Other Services      Precautions / Restrictions Precautions Precautions: Other (comment);Fall Recall of Precautions/Restrictions: Intact Precaution/Restrictions Comments: watch SpO2, chest tube Restrictions Weight Bearing Restrictions Per Provider Order: No       Mobility Bed Mobility Overal bed mobility: Needs Assistance Bed Mobility: Rolling, Sidelying to Sit Rolling: Supervision Sidelying to sit: Min assist, HOB elevated       General bed mobility comments: increased time and min assist to get to EOB    Transfers Overall transfer level: Needs assistance Equipment used: Rolling walker (2  wheels) Transfers: Sit to/from Stand, Bed to chair/wheelchair/BSC Sit to Stand: Contact guard assist     Step pivot transfers: Contact guard assist     General transfer comment: CGA for safety     Balance Overall balance assessment: Needs assistance Sitting-balance support: No upper extremity supported, Feet supported Sitting balance-Leahy Scale: Good Sitting balance - Comments: EOB   Standing balance support: Single extremity supported, Bilateral upper extremity supported, During functional activity Standing balance-Leahy Scale: Fair Standing balance comment: reliant on external support for standing balance                           ADL either performed or assessed with clinical judgement   ADL Overall ADL's : Needs assistance/impaired     Grooming: Wash/dry hands;Wash/dry face;Oral care;Brushing hair;Contact guard assist;Standing Grooming Details (indicate cue type and reason): at sink Upper Body Bathing: Set up;Sitting Upper Body Bathing Details (indicate cue type and reason): performed seated after standing for grooming tasks Lower Body Bathing: Minimal assistance;Sit to/from stand           Toilet Transfer: Contact guard assist;Ambulation;BSC/3in1;Rolling walker (2 wheels) Toilet Transfer Details (indicate cue type and reason): from EOB to [facility] Toileting- Clothing Manipulation and Hygiene: Modified independent;Sitting/lateral lean         General ADL Comments: increased activity tolerance with self care    Extremity/Trunk Assessment              Vision       Perception     Praxis     Communication     Cognition Arousal: Alert Behavior During Therapy: WFL for tasks assessed/performed Cognition: No apparent impairments  OT - Cognition Comments: pleasant and oriented                          Cueing      Exercises      Shoulder Instructions       General Comments 3 liters O2 SpO2 95%    Pertinent  Vitals/ Pain       Pain Assessment Pain Assessment: Faces Faces Pain Scale: Hurts even more Pain Location: R ribs Pain Descriptors / Indicators: Aching, Discomfort Pain Intervention(s): Monitored during session, Repositioned, Patient requesting pain meds-RN notified  Home Living                                          Prior Functioning/Environment              Frequency  Min 1X/week        Progress Toward Goals  OT Goals(current goals can now be found in the care plan section)  Progress towards OT goals: Progressing toward goals  Acute Rehab OT Goals Patient Stated Goal: get better so she can go home OT Goal Formulation: With patient Time For Goal Achievement: 10/01/23 Potential to Achieve Goals: Good ADL Goals Pt Will Perform Grooming: with modified independence;standing Pt Will Perform Lower Body Dressing: with set-up;sit to/from stand Pt Will Transfer to Toilet: with modified independence;ambulating  Plan      Co-evaluation                 AM-PAC OT "6 Clicks" Daily Activity     Outcome Measure   Help from another person eating meals?: None Help from another person taking care of personal grooming?: A Little Help from another person toileting, which includes using toliet, bedpan, or urinal?: A Little Help from another person bathing (including washing, rinsing, drying)?: A Little Help from another person to put on and taking off regular upper body clothing?: A Little Help from another person to put on and taking off regular lower body clothing?: A Little 6 Click Score: 19    End of Session Equipment Utilized During Treatment: Rolling walker (2 wheels);Oxygen  OT Visit Diagnosis: Unsteadiness on feet (R26.81);Muscle weakness (generalized) (M62.81);Other (comment) (decreased activity tolerance)   Activity Tolerance Patient tolerated treatment well   Patient Left in chair;with call bell/phone within reach;with chair alarm set    Nurse Communication Mobility status;Patient requests pain meds        Time: 0935-1000 OT Time Calculation (min): 25 min  Charges: OT General Charges $OT Visit: 1 Visit OT Treatments $Self Care/Home Management : 23-37 mins  Alfonse Flavors, OTA Acute Rehabilitation Services  Office [phone]   Dewain Penning 09/22/2023, 10:56 AM

## 2023-09-22 NOTE — TOC Progression Note (Signed)
 Transition of Care [facility]) - Progression Note    Patient Details  Name: [name] [name] MRN: [id number] Date of Birth: 1956/03/19  Transition of Care [facility]) CM/SW Contact  Glennon Mac, RN Phone Number: 09/22/2023, 2:15pm  Clinical Narrative:    Notified by PMT that patient and family are now desiring SNF for short term rehab at discharge.  Met with patient to discuss SNF workup.  Patient agreeable to her information being faxed out for bed search, and prefers [place] area.  She would like bed offers to be given to her daughter, who will assist with decision.  Will initiate FL2 and fax out for bed search.     Expected Discharge Plan: Skilled Nursing Facility Barriers to Discharge: Continued Medical Work up  Expected Discharge Plan and Services   Discharge Planning Services: CM Consult Post Acute Care Choice: Resumption of Svcs/PTA Provider Living arrangements for the past 2 months: Apartment                                       Social Determinants of Health (SDOH) Interventions SDOH Screenings   Food Insecurity: No Food Insecurity (09/16/2023)  Recent Concern: Food Insecurity - Food Insecurity Present (08/07/2023)  Housing: Low Risk  (09/16/2023)  Transportation Needs: No Transportation Needs (09/16/2023)  Utilities: Not At Risk (09/16/2023)  Alcohol Screen: Low Risk  (08/07/2023)  Depression (PHQ2-9): Medium Risk (08/07/2023)  Financial Resource Strain: Medium Risk (08/07/2023)  Physical Activity: Inactive (08/07/2023)  Social Connections: Moderately Isolated (09/16/2023)  Stress: No Stress Concern Present (08/07/2023)  Tobacco Use: High Risk (09/16/2023)  Health Literacy: Inadequate Health Literacy (08/07/2023)    Readmission Risk Interventions    06/20/2023    3:17 PM  Readmission Risk Prevention Plan  Transportation Screening Complete  HRI or Home Care Consult Complete  Social Work Consult for Recovery Care Planning/Counseling Complete  Palliative Care Screening Not  Applicable  Medication Review (RN Care Manager) Complete   Quintella Baton, RN, BSN  Trauma/Neuro ICU Case Manager [phone]

## 2023-09-22 NOTE — Progress Notes (Addendum)
 Progress Note     Subjective: No new complaints with stable pain and SHOB. She states cough is improved but does have productive cough several times while I am in the room. On 2 lpm this am. Denies n/v/abdominal pain. No BM yesterday or today. Voiding well.   Objective: Vital signs in last 24 hours: Temp:  [97.6 F (36.4 C)-98.8 F (37.1 C)] 97.7 F (36.5 C) (03/10 0817) Pulse Rate:  [56-71] 56 (03/10 0817) Resp:  [14-20] 15 (03/10 0817) BP: (113-156)/(56-81) 113/56 (03/10 0817) SpO2:  [92 %-100 %] 97 % (03/10 0817) Last BM Date : 09/20/23  Intake/Output from previous day: 03/09 0701 - 03/10 0700 In: 580 [P.O.:480; IV Piggyback:100] Out: 320 [Urine:300; Chest Tube:20] Intake/Output this shift: No intake/output data recorded.  PE: General: pleasant, WD, chronically ill appearing female, NAD Heart: regular, rate, and rhythm.   Lungs: CTAB. Intermittent productive cough. R CT in place with no airleak today with cough or inspiration. CT w/ 20 ml out last 24h. on -20 Abd: soft, NT, ND Msk: no le edema Psych: A&Ox3 with an appropriate affect.   Lab Results:  Recent Labs    09/20/23 0334  WBC 13.9*  HGB 11.8*  HCT 37.1  PLT 348   BMET No results for input(s): "NA", "K", "CL", "CO2", "GLUCOSE", "BUN", "CREATININE", "CALCIUM" in the last 72 hours.  PT/INR No results for input(s): "LABPROT", "INR" in the last 72 hours. CMP     Component Value Date/Time   NA 137 09/19/2023 0311   NA 142 06/26/2023 1339   K 4.0 09/19/2023 0311   CL 106 09/19/2023 0311   CO2 27 09/19/2023 0311   GLUCOSE 118 (H) 09/19/2023 0311   BUN 26 (H) 09/19/2023 0311   BUN 14 06/26/2023 1339   CREATININE 1.00 09/19/2023 0311   CREATININE 0.80 03/24/2023 1232   CREATININE 0.93 04/16/2016 0937   CALCIUM 8.3 (L) 09/19/2023 0311   PROT 5.9 (L) 09/01/2023 0527   PROT 6.2 06/26/2023 1339   ALBUMIN 3.0 (L) 09/01/2023 0527   ALBUMIN 4.0 06/26/2023 1339   AST 16 09/01/2023 0527   AST 19  03/24/2023 1232   ALT 18 09/01/2023 0527   ALT 23 03/24/2023 1232   ALKPHOS 60 09/01/2023 0527   BILITOT 0.7 09/01/2023 0527   BILITOT 0.4 06/26/2023 1339   BILITOT 0.4 03/24/2023 1232   GFRNONAA >60 09/19/2023 0311   GFRNONAA >60 03/24/2023 1232   GFRNONAA 67 04/16/2016 0937   GFRAA 77 04/05/2019 0926   GFRAA 78 04/16/2016 0937   Lipase     Component Value Date/Time   LIPASE 23 02/11/2021 0416       Studies/Results: DG CHEST PORT 1 VIEW Result Date: 09/21/2023 CLINICAL DATA:  Pneumothorax. EXAM: PORTABLE CHEST 1 VIEW COMPARISON:  One-view chest x-ray 09/20/2023. FINDINGS: The heart is mildly enlarged. Atherosclerotic calcifications are present at the aortic arch. Bilateral pleural effusions and basilar atelectasis are present. A tiny right apical pneumothorax remains. Right-sided chest tube is stable. IMPRESSION: 1. Stable tiny right apical pneumothorax. Right-sided chest tube is stable. 2. Bilateral pleural effusions and basilar atelectasis. Electronically Signed   By: Marin Roberts M.D.   On: 09/21/2023 10:05    Anti-infectives: Anti-infectives (From admission, onward)    Start     Dose/Rate Route Frequency Ordered Stop   09/21/23 1115  cefTRIAXone (ROCEPHIN) 2 g in sodium chloride 0.9 % 100 mL IVPB        2 g 200 mL/hr over 30 Minutes Intravenous Every  24 hours 09/21/23 1022     09/18/23 0430  ceFEPIme (MAXIPIME) 2 g in sodium chloride 0.9 % 100 mL IVPB  Status:  Discontinued        2 g 200 mL/hr over 30 Minutes Intravenous Every 12 hours 09/18/23 0424 09/21/23 1022        Assessment/Plan Fall out of bed R rib fractures 4-9 - pulm toilet, IS, pain control, mobilization R PTX - pigtail placed in trauma bay.  On -40. Airleak resolved 3/7. Repeat CXR yesterday with stable tiny right apical PTX. Decreased to -20 yesterdya. Am xray read pending. If stable consider WS today vs tomorrow. Am cxr Acute on Chronic respiratory failure on home O2/COPD/ possible PNA -  greatly appreciate CCM assistance with this patient.  DNR/DNI now. Nebs. Sulomedrol initially. Now on prednisone taper. Lasix. Mucinex. Resp cx w/ moderate serratia marcescens-  Cefepime now transitioned to CTX.   CAD/MI - continue Brilinta DM - SSI HTN - home meds HLD Depression/Anxiety - klonopin bid, doxepin bid, lexapro qd Hypothyroidism - home meds Acute urinary retention - foley placed in setting of lasix use and retention on 3/6. Out 3/8 and voiding.  Hypotension - could be secondary to some lasix vs developing PNA.  improving.  Cont to monitor.  Home HTN meds have hold parameters FEN - carb mod diet, inc bowel regimen VTE - ASA/Brilinta/Lovenox ID - Cefepime 3/5-3/8, rocephin 3/9>  DISPO - 4E, cont pulmonary care and CT care.  Greatly appreciate all consultants.     LOS: 6 days   I reviewed Consultant pulmonology, palliative care notes, last 24 h vitals and pain scores, last 48 h intake and output, last 24 h labs and trends, and last 24 h imaging results.   Eric Form, [facility] 09/22/2023, 8:36 AM Please see Amion for pager number during day hours 7:00am-4:30pm

## 2023-09-22 NOTE — [place] FL2 (Signed)
 [place] MEDICAID FL2 LEVEL OF CARE FORM     IDENTIFICATION  Patient Name: [name] [name] Birthdate: [birth year] Sex: female Admission Date (Current Location): 09/16/2023  [place] and [place] Number:  Haynes Bast [id number] [facility] Facility and Address:  [facility], [address], [place], [place] [address]      Provider Number: [id number]  Attending Physician Name and Address:  Md, Trauma, MD  Relative Name and Phone Number:  Binnie Kand, daughter: phone [phone]    Current Level of Care: Hospital Recommended Level of Care: Skilled Nursing Facility Prior Approval Number:    Date Approved/Denied:   PASRR Number: [id number]  Discharge Plan: SNF    Current Diagnoses: Patient Active Problem List   Diagnosis Date Noted   Pneumothorax, right 09/16/2023   COPD with acute exacerbation (HCC) 09/01/2023   Chronic hypoxic respiratory failure (HCC) 09/01/2023   Continuous dependence on cigarette smoking 09/01/2023   History of CAD (coronary artery disease) 09/01/2023   CAP (community acquired pneumonia) 09/01/2023   Infection due to parainfluenza virus 4 06/20/2023   Full code status 06/14/2023   Acute hypoxic respiratory failure (HCC) 06/13/2023   Acute exacerbation of chronic obstructive pulmonary disease (COPD) (HCC) 06/12/2023   Nicotine dependence 05/03/2023   Substance abuse (HCC) 09/10/2022   Iron deficiency anemia 11/02/2021   Major depressive disorder, recurrent, in full remission with anxious distress (HCC) 05/31/2021   Chronic neck pain 02/26/2021   GAD (generalized anxiety disorder) 01/08/2021   Esophageal dysphagia 04/28/2019   Bipolar I disorder (HCC) 09/17/2018   Insomnia 10/28/2017   Prediabetes 05/28/2017   QT prolongation 03/25/2017   Psoriasis 06/22/2015   COPD (chronic obstructive pulmonary disease) (HCC)GOLD E 05/18/2015   Anxiety and depression 07/10/2013   Essential hypertension 06/07/2013   Hyperlipidemia 06/07/2013    Asthma, chronic 06/07/2013   Emphysema lung (HCC) 06/07/2013   Chronic back pain 06/07/2013   CAD S/P percutaneous coronary angioplasty 06/07/2013   GERD (gastroesophageal reflux disease) 06/07/2013   Breast lump on left side at 3 o'clock position 10/13/2012   S/P abdominal hysterectomy and right salpingo-oophorectomy 08/29/2011   COPD exacerbation (HCC) 09/15/2009   TOBACCO ABUSE 07/17/2009   Chronic rhinitis 07/17/2009   Lung nodule < 6cm on CT 07/17/2009   Hypothyroidism 12/22/2008   Chronic depression 12/22/2008    Orientation RESPIRATION BLADDER Height & Weight     Self, Time, Situation, Place  O2 (O2 @2L /[place]) Continent Weight: 55.5 kg Height:  4\' 9"  (144.8 cm)  BEHAVIORAL SYMPTOMS/MOOD NEUROLOGICAL BOWEL NUTRITION STATUS      Continent Diet (Carb modified, thin liquids)  AMBULATORY STATUS COMMUNICATION OF NEEDS Skin   Limited Assist Verbally  (Bilateral arms, chest, abdomen)                       Personal Care Assistance Level of Assistance  Bathing Bathing Assistance: Limited assistance         Functional Limitations Info             SPECIAL CARE FACTORS FREQUENCY  PT (By licensed PT), OT (By licensed OT)     PT Frequency: 5x weekly OT Frequency: 5x weekly            Contractures Contractures Info: Not present    Additional Factors Info  Code Status Code Status Info: DNR-Limited             Current Medications (09/22/2023):  This is the current hospital active medication list Current Facility-Administered Medications  Medication Dose Route Frequency Provider Last Rate Last Admin   acetaminophen (TYLENOL) tablet 1,000 mg  1,000 mg Oral Q6H Violeta Gelinas, MD   1,000 mg at 09/22/23 1100   albuterol (PROVENTIL) (2.5 MG/3ML) 0.083% nebulizer solution 2.5 mg  2.5 mg Nebulization Q6H PRN Violeta Gelinas, MD   2.5 mg at 09/18/23 0115   amLODipine (NORVASC) tablet 5 mg  5 mg Oral Daily Barnetta Chapel, PA-C   5 mg at 09/22/23 0901   arformoterol  (BROVANA) nebulizer solution 15 mcg  15 mcg Nebulization BID Barnetta Chapel, PA-C   15 mcg at 09/22/23 0981   aspirin EC tablet 81 mg  81 mg Oral Daily Barnetta Chapel, PA-C   81 mg at 09/22/23 0900   atorvastatin (LIPITOR) tablet 80 mg  80 mg Oral Daily Violeta Gelinas, MD   80 mg at 09/22/23 1914   bisacodyl (DULCOLAX) suppository 10 mg  10 mg Rectal Daily PRN Eric Form, PA-C       budesonide (PULMICORT) nebulizer solution 0.5 mg  0.5 mg Nebulization BID Barnetta Chapel, PA-C   0.5 mg at 09/22/23 0739   busPIRone (BUSPAR) tablet 30 mg  30 mg Oral q AM Barnetta Chapel, PA-C   30 mg at 09/21/23 1104   And   busPIRone (BUSPAR) tablet 15 mg  15 mg Oral Q1200 Barnetta Chapel, PA-C   15 mg at 09/22/23 1101   And   busPIRone (BUSPAR) tablet 15 mg  15 mg Oral QPM Barnetta Chapel, PA-C   15 mg at 09/21/23 2028   cefTRIAXone (ROCEPHIN) 2 g in sodium chloride 0.9 % 100 mL IVPB  2 g Intravenous Q24H Jacinto Halim, PA-C 200 mL/hr at 09/22/23 1105 2 g at 09/22/23 1105   clonazePAM (KLONOPIN) disintegrating tablet 0.5 mg  0.5 mg Oral BID Violeta Gelinas, MD   0.5 mg at 09/22/23 0901   docusate sodium (COLACE) capsule 100 mg  100 mg Oral BID Violeta Gelinas, MD   100 mg at 09/22/23 0900   doxepin (SINEQUAN) capsule 75 mg  75 mg Oral QHS Violeta Gelinas, MD   75 mg at 09/21/23 2023   enoxaparin (LOVENOX) injection 30 mg  30 mg Subcutaneous Q12H Violeta Gelinas, MD   30 mg at 09/22/23 0905   escitalopram (LEXAPRO) tablet 20 mg  20 mg Oral Daily Violeta Gelinas, MD   20 mg at 09/22/23 0900   ezetimibe (ZETIA) tablet 10 mg  10 mg Oral Ulice Bold, MD   10 mg at 09/21/23 2023   furosemide (LASIX) tablet 20 mg  20 mg Oral Daily Violeta Gelinas, MD   20 mg at 09/22/23 0900   gabapentin (NEURONTIN) capsule 600 mg  600 mg Oral BID Violeta Gelinas, MD   600 mg at 09/22/23 0856   guaiFENesin (MUCINEX) 12 hr tablet 600 mg  600 mg Oral BID Juliet Rude, PA-C   600 mg at 09/22/23 0856    guaiFENesin-dextromethorphan (ROBITUSSIN DM) 100-10 MG/5ML syrup 5 mL  5 mL Oral Q4H PRN Axel Filler, MD   5 mL at 09/21/23 2022   hydrALAZINE (APRESOLINE) injection 10 mg  10 mg Intravenous Q2H PRN Violeta Gelinas, MD       HYDROmorphone (DILAUDID) injection 0.5 mg  0.5 mg Intravenous Q2H PRN Violeta Gelinas, MD   0.5 mg at 09/21/23 0041   insulin aspart (novoLOG) injection 0-15 Units  0-15 Units Subcutaneous TID WC Violeta Gelinas, MD   2 Units at 09/22/23 1244   insulin aspart (novoLOG)  injection 0-5 Units  0-5 Units Subcutaneous QHS Violeta Gelinas, MD   4 Units at 09/20/23 2208   levothyroxine (SYNTHROID) tablet 112 mcg  112 mcg Oral Daily Violeta Gelinas, MD   112 mcg at 09/22/23 0432   lidocaine (LIDODERM) 5 % 1 patch  1 patch Transdermal Q24H Juliet Rude, PA-C   1 patch at 09/22/23 0912   losartan (COZAAR) tablet 75 mg  75 mg Oral Daily Barnetta Chapel, PA-C   75 mg at 09/22/23 0900   metoprolol tartrate (LOPRESSOR) injection 5 mg  5 mg Intravenous Q6H PRN Violeta Gelinas, MD       mirtazapine (REMERON) tablet 45 mg  45 mg Oral QHS Violeta Gelinas, MD   45 mg at 09/21/23 2023   montelukast (SINGULAIR) tablet 10 mg  10 mg Oral QHS Violeta Gelinas, MD   10 mg at 09/21/23 2025   nebivolol (BYSTOLIC) tablet 10 mg  10 mg Oral QHS Violeta Gelinas, MD   10 mg at 09/21/23 2025   nicotine (NICODERM CQ - dosed in mg/24 hours) patch 21 mg  21 mg Transdermal Daily Violeta Gelinas, MD   21 mg at 09/22/23 5784   oxyCODONE (Oxy IR/ROXICODONE) immediate release tablet 5-10 mg  5-10 mg Oral Q4H PRN Juliet Rude, PA-C   10 mg at 09/22/23 1055   polyethylene glycol (MIRALAX / GLYCOLAX) packet 17 g  17 g Oral Daily Eric Form, PA-C   17 g at 09/22/23 1101   predniSONE (DELTASONE) tablet 30 mg  30 mg Oral Q breakfast Luciano Cutter, MD   30 mg at 09/22/23 0901   Followed by   Melene Muller ON 09/24/2023] predniSONE (DELTASONE) tablet 20 mg  20 mg Oral Q breakfast Luciano Cutter, MD        Followed by   Melene Muller ON 09/27/2023] predniSONE (DELTASONE) tablet 10 mg  10 mg Oral Q breakfast Luciano Cutter, MD       prochlorperazine (COMPAZINE) injection 5 mg  5 mg Intravenous Q6H PRN Violeta Gelinas, MD   5 mg at 09/20/23 1335   revefenacin (YUPELRI) nebulizer solution 175 mcg  175 mcg Nebulization Daily Barnetta Chapel, PA-C   175 mcg at 09/22/23 0739   sodium chloride flush (NS) 0.9 % injection 10-40 mL  10-40 mL Intracatheter Q12H Jacinto Halim, PA-C   10 mL at 09/22/23 1244   sodium chloride flush (NS) 0.9 % injection 10-40 mL  10-40 mL Intracatheter PRN Maczis, Elmer Sow, PA-C       ticagrelor [facility]) tablet 60 mg  60 mg Oral BID Violeta Gelinas, MD   60 mg at 09/22/23 0901     Discharge Medications: Please see discharge summary for a list of discharge medications.  Relevant Imaging Results:  Relevant Lab Results:   Additional Information SSN: [id number]  Quintella Baton, RN, BSN  Trauma/Neuro ICU Case Manager [phone]

## 2023-09-22 NOTE — Progress Notes (Signed)

## 2023-09-23 ENCOUNTER — Other Ambulatory Visit (HOSPITAL_COMMUNITY): Payer: Self-pay

## 2023-09-23 ENCOUNTER — Inpatient Hospital Stay (HOSPITAL_COMMUNITY)

## 2023-09-23 DIAGNOSIS — Z515 Encounter for palliative care: Secondary | ICD-10-CM | POA: Diagnosis not present

## 2023-09-23 DIAGNOSIS — J9621 Acute and chronic respiratory failure with hypoxia: Secondary | ICD-10-CM | POA: Diagnosis not present

## 2023-09-23 DIAGNOSIS — W19XXXA Unspecified fall, initial encounter: Secondary | ICD-10-CM | POA: Diagnosis not present

## 2023-09-23 DIAGNOSIS — S2241XA Multiple fractures of ribs, right side, initial encounter for closed fracture: Secondary | ICD-10-CM | POA: Diagnosis not present

## 2023-09-23 DIAGNOSIS — J441 Chronic obstructive pulmonary disease with (acute) exacerbation: Secondary | ICD-10-CM | POA: Diagnosis not present

## 2023-09-23 DIAGNOSIS — S270XXA Traumatic pneumothorax, initial encounter: Secondary | ICD-10-CM | POA: Diagnosis not present

## 2023-09-23 LAB — GLUCOSE, CAPILLARY
Glucose-Capillary: 98 mg/dL (ref 70–99)
Glucose-Capillary: 106 mg/dL — ABNORMAL HIGH (ref 70–99)
Glucose-Capillary: 300 mg/dL — ABNORMAL HIGH (ref 70–99)
Glucose-Capillary: 169 mg/dL — ABNORMAL HIGH (ref 70–99)

## 2023-09-23 LAB — CBC
HCT: 37.3 % (ref 36.0–46.0)
Hemoglobin: 12.1 g/dL (ref 12.0–15.0)
MCH: 31.9 pg (ref 26.0–34.0)
MCHC: 32.4 g/dL (ref 30.0–36.0)
MCV: 98.4 fL (ref 80.0–100.0)
Platelets: 443 10*3/uL — ABNORMAL HIGH (ref 150–400)
RBC: 3.79 MIL/uL — ABNORMAL LOW (ref 3.87–5.11)
RDW: 15.2 % (ref 11.5–15.5)
WBC: 13.4 10*3/uL — ABNORMAL HIGH (ref 4.0–10.5)
nRBC: 0 % (ref 0.0–0.2)

## 2023-09-23 MED ORDER — AZELASTINE HCL 0.1 % NA SOLN
2.0000 | Freq: Two times a day (BID) | NASAL | Status: DC
  Administered 2023-09-24: 2 via NASAL
  Filled 2023-09-23 (×2): qty 30

## 2023-09-23 NOTE — TOC Progression Note (Signed)
 Transition of Care [facility]) - Progression Note    Patient Details  Name: [name] [name] MRN: [id number] Date of Birth: Feb 26, 1956  Transition of Care [facility]) CM/SW Contact  Astrid Drafts Berna Spare, RN Phone Number: 09/23/2023, 2:10 PM  Clinical Narrative:    Bed offers given to patient's daughter, Tiffiney; she states she would like to investigate offers and call me back.  Will follow with updates as available.    Expected Discharge Plan: Skilled Nursing Facility Barriers to Discharge: Continued Medical Work up  Expected Discharge Plan and Services   Discharge Planning Services: CM Consult Post Acute Care Choice: Resumption of Svcs/PTA Provider Living arrangements for the past 2 months: Apartment                                       Social Determinants of Health (SDOH) Interventions SDOH Screenings   Food Insecurity: No Food Insecurity (09/16/2023)  Recent Concern: Food Insecurity - Food Insecurity Present (08/07/2023)  Housing: Low Risk  (09/16/2023)  Transportation Needs: No Transportation Needs (09/16/2023)  Utilities: Not At Risk (09/16/2023)  Alcohol Screen: Low Risk  (08/07/2023)  Depression (PHQ2-9): Medium Risk (08/07/2023)  Financial Resource Strain: Medium Risk (08/07/2023)  Physical Activity: Inactive (08/07/2023)  Social Connections: Moderately Isolated (09/16/2023)  Stress: No Stress Concern Present (08/07/2023)  Tobacco Use: High Risk (09/16/2023)  Health Literacy: Inadequate Health Literacy (08/07/2023)    Readmission Risk Interventions    06/20/2023    3:17 PM  Readmission Risk Prevention Plan  Transportation Screening Complete  HRI or Home Care Consult Complete  Social Work Consult for Recovery Care Planning/Counseling Complete  Palliative Care Screening Not Applicable  Medication Review (RN Care Manager) Complete   Quintella Baton, RN, BSN  Trauma/Neuro ICU Case Manager [phone]

## 2023-09-23 NOTE — Progress Notes (Addendum)
 Daily Progress Note   Patient Name: [name] [name]       Date: 09/23/2023 DOB: 1956/04/14  Age: 68 y.o. MRN#: [id number] Attending Physician: Roslynn Amble, MD Primary Care Physician: Martina Sinner, MD Admit Date: 09/16/2023  Reason for Consultation/Follow-up: Establishing goals of care  Subjective: I have reviewed medical records including EPIC notes, MAR, any available advanced directives as necessary, and labs. Noted pending CXR, CT may be removed today and possible discharge to SNF as early as tomorrow. Received report from primary RN - no acute concerns.  Went to visit patient at bedside - no family/visitors present.  Patient was lying in bed asleep -she does wake to voice/gentle touch. No signs or non-verbal gestures of pain or discomfort noted. No respiratory distress, increased work of breathing, or secretions noted.  Patient does endorse 10 out of 10 pain on her right side -encouraged her to call for next dose of pain medication if desired, which she did.  Patient does confirm that pain is improved to a tolerated level with oxycodone. Congested cough noted throughout visit.   Attempted to call daughter x2 while in room per her request yesterday and patient's permission today. Unfortunately, daughter did not answer - confidential  VM left.   Emotional support provided to patient.  Reviewed that clinically she does seem to be improving while here in house; however, discussed that she remains high risk for rehospitalization.  Long-term trajectory discussed in detail as well as anticipatory care planning.  Patient understands it is uncertain at this time if she will ever be able to return home alone.  Patient expresses that being independent is very important to her.  We discussed options of  continued aggressive care/rehospitalization's versus comfort/hospice care.  We discussed appropriate times to enroll in hospice are when medical interventions are no longer working, someone no longer desires aggressive interventions, symptoms are getting harder to manage, frequent hospitalizations, poor quality of life.  Therapeutic listening provided as patient outlines considering/thinking/discussing hospice care with her daughter previously.  She knows this is an option when she is ready.  Home versus residential hospice reviewed per her request.  At this time, patient tells me she is "looking forward to moving forward."  She is most interested in discharge to rehab with goal to regain strength and hopefully return home.  Outpatient  palliative care explained and offered - patient accepts follow-up.  She is open to ongoing goals of care with them pending her clinical course.  She understands they can transition her to hospice if/when she is ready in the future.  Introduced, reviewed, and completed MOST for with patient. Outlined below are their wishes for the following treatment decisions:  Cardiopulmonary Resuscitation: Do Not Attempt Resuscitation (DNR/No CPR)  Medical Interventions: Limited Additional Interventions: Use medical treatment, IV fluids and cardiac monitoring as indicated, DO NOT USE intubation or mechanical ventilation. May consider use of less invasive airway support such as BiPAP or CPAP. Also provide comfort measures. Transfer to the hospital if indicated. Avoid intensive care.   Antibiotics: Determine use of limitation of antibiotics when infection occurs  IV Fluids: IV fluids for a defined trial period  Feeding Tube: No feeding tube   All questions and concerns addressed. Encouraged to call with questions and/or concerns. PMT card previously provided.  Length of Stay: 7  Current Medications: Scheduled Meds:   acetaminophen  1,000 mg Oral Q6H   amLODipine  5 mg Oral Daily    arformoterol  15 mcg Nebulization BID   aspirin EC  81 mg Oral Daily   atorvastatin  80 mg Oral Daily   budesonide (PULMICORT) nebulizer solution  0.5 mg Nebulization BID   busPIRone  30 mg Oral q AM   And   busPIRone  15 mg Oral Q1200   And   busPIRone  15 mg Oral QPM   clonazePAM  0.5 mg Oral BID   docusate sodium  100 mg Oral BID   doxepin  75 mg Oral QHS   enoxaparin (LOVENOX) injection  30 mg Subcutaneous Q12H   escitalopram  20 mg Oral Daily   ezetimibe  10 mg Oral QHS   furosemide  20 mg Oral Daily   gabapentin  600 mg Oral BID   guaiFENesin  600 mg Oral BID   insulin aspart  0-15 Units Subcutaneous TID WC   insulin aspart  0-5 Units Subcutaneous QHS   levothyroxine  112 mcg Oral Daily   lidocaine  1 patch Transdermal Q24H   losartan  75 mg Oral Daily   mirtazapine  45 mg Oral QHS   montelukast  10 mg Oral QHS   nebivolol  10 mg Oral QHS   nicotine  21 mg Transdermal Daily   polyethylene glycol  17 g Oral Daily   predniSONE  30 mg Oral Q breakfast   Followed by   Melene Muller ON 09/24/2023] predniSONE  20 mg Oral Q breakfast   Followed by   Melene Muller ON 09/27/2023] predniSONE  10 mg Oral Q breakfast   revefenacin  175 mcg Nebulization Daily   sodium chloride flush  10-40 mL Intracatheter Q12H   ticagrelor  60 mg Oral BID    Continuous Infusions:  cefTRIAXone (ROCEPHIN)  IV Stopped (09/22/23 1301)    PRN Meds: albuterol, bisacodyl, guaiFENesin-dextromethorphan, hydrALAZINE, HYDROmorphone (DILAUDID) injection, metoprolol tartrate, oxyCODONE, prochlorperazine, sodium chloride flush  Physical Exam Vitals and nursing note reviewed.  Constitutional:      General: She is not in acute distress.    Appearance: She is ill-appearing.  Pulmonary:     Effort: No respiratory distress.  Skin:    General: Skin is warm and dry.  Neurological:     Mental Status: She is alert and oriented to person, place, and time.     Motor: Weakness present.  Psychiatric:        Attention  and  Perception: Attention normal.        Behavior: Behavior is cooperative.        Cognition and Memory: Cognition and memory normal.             Vital Signs: BP (!) 156/72 (BP Location: Right Arm)   Pulse (!) 117   Temp 98.1 F (36.7 C) (Oral)   Resp 18   Ht 4\' 9"  (1.448 m)   Wt 55.5 kg   SpO2 99%   BMI 26.48 kg/m  SpO2: SpO2: 99 % O2 Device: O2 Device: Nasal Cannula O2 Flow Rate: O2 Flow Rate (L/min): 2 L/min  Intake/output summary:  Intake/Output Summary (Last 24 hours) at 09/23/2023 8295 Last data filed at 09/23/2023 0400 Gross per 24 hour  Intake 820 ml  Output 1050 ml  Net -230 ml   LBM: Last BM Date : 09/20/23 Baseline Weight: Weight: 69.9 kg Most recent weight: Weight: 55.5 kg       Palliative Assessment/Data: PPS 50-60%      Patient Active Problem List   Diagnosis Date Noted   Pneumothorax, right 09/16/2023   COPD with acute exacerbation (HCC) 09/01/2023   Chronic hypoxic respiratory failure (HCC) 09/01/2023   Continuous dependence on cigarette smoking 09/01/2023   History of CAD (coronary artery disease) 09/01/2023   CAP (community acquired pneumonia) 09/01/2023   Infection due to parainfluenza virus 4 06/20/2023   Full code status 06/14/2023   Acute hypoxic respiratory failure (HCC) 06/13/2023   Acute exacerbation of chronic obstructive pulmonary disease (COPD) (HCC) 06/12/2023   Nicotine dependence 05/03/2023   Substance abuse (HCC) 09/10/2022   Iron deficiency anemia 11/02/2021   Major depressive disorder, recurrent, in full remission with anxious distress (HCC) 05/31/2021   Chronic neck pain 02/26/2021   GAD (generalized anxiety disorder) 01/08/2021   Esophageal dysphagia 04/28/2019   Bipolar I disorder (HCC) 09/17/2018   Insomnia 10/28/2017   Prediabetes 05/28/2017   QT prolongation 03/25/2017   Psoriasis 06/22/2015   COPD (chronic obstructive pulmonary disease) (HCC)GOLD E 05/18/2015   Anxiety and depression 07/10/2013   Essential  hypertension 06/07/2013   Hyperlipidemia 06/07/2013   Asthma, chronic 06/07/2013   Emphysema lung (HCC) 06/07/2013   Chronic back pain 06/07/2013   CAD S/P percutaneous coronary angioplasty 06/07/2013   GERD (gastroesophageal reflux disease) 06/07/2013   Breast lump on left side at 3 o'clock position 10/13/2012   S/P abdominal hysterectomy and right salpingo-oophorectomy 08/29/2011   COPD exacerbation (HCC) 09/15/2009   TOBACCO ABUSE 07/17/2009   Chronic rhinitis 07/17/2009   Lung nodule < 6cm on CT 07/17/2009   Hypothyroidism 12/22/2008   Chronic depression 12/22/2008    Palliative Care Assessment & Plan   Patient Profile: 68 y.o. female  with past medical history of chronic respiratory failure on 2L O2 [place] at home, COPD with recent exacerbation/multiple hospitalizations, HTN, HLD, CAD/MI with stents on plavix and ASA, DM, depression/anxiety, and hypothyroidism  admitted on 09/16/2023 with right-sided rib fractures and a R PTX. Chest tube placed. Also treating for possible pneumonia. PMT consulted to discuss GOC.    Of note, she has had 5 admissions and 1 ED visit in the last 6 months.   Assessment: Principal Problem:   Pneumothorax, right   Terminal care  Recommendations/Plan: Continue to treat the treatable DNR/DNI confirmed Goal is for discharge to SNF rehab with outpatient Palliative Care to follow MOST form completed as noted above under Subjective  Durable DNR form and MOST form completed and placed in shadow chart.  Copies made and will be scanned into Vynca/ACP tab PMT will continue to follow and support holistically  **ADDENDUM** 10:40 AM Received notification daughter/Tiffiney returned call.  2:25 PM Called Tiffiney - emotional support provided. She was able to review PMT note via mychart and has a clear understanding of the conversation/discussion had with patient earlier. Education provided on MOST form and patient's expressed/documented wishes. Reviewed next steps  for discharge to rehab - she is considering touring facilities to help her choose. Per her request, confirmed outpatient Palliative Care would follow after discharge. +15 minutes  Goals of Care and Additional Recommendations: Limitations on Scope of Treatment: Full Scope Treatment  Code Status:    Code Status Orders  (From admission, onward)           Start     Ordered   09/18/23 1613  Do not attempt resuscitation (DNR)- Limited -Do Not Intubate (DNI)  (Code Status)  Continuous       Question Answer Comment  If pulseless and not breathing No CPR or chest compressions.   In Pre-Arrest Conditions (Patient Is Breathing and Has A Pulse) Do not intubate. Provide all appropriate non-invasive medical interventions. Avoid ICU transfer unless indicated or required.   Consent: Discussion documented in EHR or advanced directives reviewed      09/18/23 1612           Code Status History     Date Active Date Inactive Code Status Order ID Comments User Context   09/16/2023 1333 09/18/2023 1612 Full Code [id number]  Violeta Gelinas, MD ED   09/01/2023 0051 09/05/2023 2311 Full Code [id number]  Tereasa Coop, MD ED   07/21/2023 2105 07/23/2023 1856 Full Code [id number]  Modena Slater, DO ED   07/12/2023 0943 07/15/2023 1649 Full Code [id number]  Reymundo Poll, MD ED   06/12/2023 1008 06/20/2023 2233 Full Code [id number]  Masters, Katie, DO ED   09/15/2017 1951 09/16/2017 2159 Full Code [id number]  Nyra Market, MD ED   03/25/2017 1459 03/27/2017 1726 Full Code [id number]  Ozella Rocks, MD ED   03/25/2017 1459 03/25/2017 1459 Full Code [id number]  Ozella Rocks, MD ED   10/26/2015 2303 10/30/2015 1500 Full Code [id number]  Alberteen Sam, MD Inpatient   07/09/2013 1614 07/10/2013 0339 Full Code [id number]  Arthor Captain, PA-C ED      Advance Directive Documentation    Flowsheet Row Most Recent Value  Type of Advance Directive Healthcare Power of Attorney, Living will  Pre-existing out of  facility DNR order (yellow form or pink MOST form) --  "MOST" Form in Place? --       Prognosis:  Poor long term  Discharge Planning: Skilled Nursing Facility for rehab with Palliative care service follow-up  Care plan was discussed with primary RN, patient  Thank you for allowing the Palliative Medicine Team to assist in the care of this patient.   Total Time 65 minutes +15 minutes Prolonged Time Billed  yes       Haskel Khan, NP  Please contact Palliative Medicine Team phone at [phone] for questions and concerns.   *Portions of this note are a verbal dictation therefore any spelling and/or grammatical errors are due to the "[employer] One" system interpretation.

## 2023-09-23 NOTE — Discharge Summary (Signed)
 Physician Discharge Summary  Patient ID: [name] [name] MRN: [id number] DOB/AGE: 1955-08-21 68 y.o.  Admit date: 09/16/2023 Discharge date: 09/25/2023  Admission Diagnoses Pneumothorax on right [J93.9] Pneumothorax, right [J93.9] Traumatic pneumothorax, initial encounter [S27.0XXA] Fall, initial encounter [id number]] Closed fracture of multiple ribs of right side, initial encounter [S22.41XA]  Discharge Diagnoses Patient Active Problem List   Diagnosis Date Noted   Pneumothorax, right 09/16/2023   COPD with acute exacerbation (HCC) 09/01/2023   Chronic hypoxic respiratory failure (HCC) 09/01/2023   Continuous dependence on cigarette smoking 09/01/2023   History of CAD (coronary artery disease) 09/01/2023   CAP (community acquired pneumonia) 09/01/2023   Infection due to parainfluenza virus 4 06/20/2023   Full code status 06/14/2023   Acute hypoxic respiratory failure (HCC) 06/13/2023   Acute exacerbation of chronic obstructive pulmonary disease (COPD) (HCC) 06/12/2023   Nicotine dependence 05/03/2023   Substance abuse (HCC) 09/10/2022   Iron deficiency anemia 11/02/2021   Major depressive disorder, recurrent, in full remission with anxious distress (HCC) 05/31/2021   Chronic neck pain 02/26/2021   GAD (generalized anxiety disorder) 01/08/2021   Esophageal dysphagia 04/28/2019   Bipolar I disorder (HCC) 09/17/2018   Insomnia 10/28/2017   Prediabetes 05/28/2017   QT prolongation 03/25/2017   Psoriasis 06/22/2015   COPD (chronic obstructive pulmonary disease) (HCC)GOLD E 05/18/2015   Anxiety and depression 07/10/2013   Essential hypertension 06/07/2013   Hyperlipidemia 06/07/2013   Asthma, chronic 06/07/2013   Emphysema lung (HCC) 06/07/2013   Chronic back pain 06/07/2013   CAD S/P percutaneous coronary angioplasty 06/07/2013   GERD (gastroesophageal reflux disease) 06/07/2013   Breast lump on left side at 3 o'clock position 10/13/2012   S/P abdominal hysterectomy and right  salpingo-oophorectomy 08/29/2011   COPD exacerbation (HCC) 09/15/2009   TOBACCO ABUSE 07/17/2009   Chronic rhinitis 07/17/2009   Lung nodule < 6cm on CT 07/17/2009   Hypothyroidism 12/22/2008   Chronic depression 12/22/2008    Consultants Pulmonary/Critical Care Palliative Care  Procedures Right Chest tube placement 3/4 Dr. Janee Morn  HPI: This is a pleasant 68 yo female with a history of chronic respiratory failure, on 2L O2 [place] at home, COPD with recent exacerbation, HTN, HLD, CAD/MI with stents on plavix and ASA, DM, depression/anxiety, and hypothyroidism who was in bed last night and rolled out accidentally and hit her head on the side table and fell on the right side of her body. She had pain in her chest immediately. She has no other complaints. In the ED she has been found to have right-sided rib fractures and a R PTX. She has no other acute traumatic findings. We have been asked to see her for admission.   Hospital Course:   Patient was admitted to the trauma service for further evaluation and treatment as below:  Fall out of bed Right rib fractures 4-9 - pulmonary toilet with IS and pain control during admission. She mobilized with therapies Right pneumothorax - pigtail chest tube was placed in trauma bay with suction increased to -40. She had and air leak which resolved 3/7. Serial xrays obtained with stable to improved tiny apical pneumothorax with CT moved to -20 suction then subsequently water sealed and removed. Post removal chest xray with continued resolution pneumothorax. She will need a repeat chest x ray outpatient in 2 weeks Acute on Chronic respiratory failure on home O2/COPD/ possible pneumonia - greatly appreciate CCM assistance with this patient.  Palliative care also consulted and she was changed to DNR/DNI after discussions. She was given  nebulizer treatments, lasix and steroids. A respiratory culture was completed showing moderate serratia marcescens for which she  completed appropriate IV antibiotics. CCM reccommended continue prednisone taper on discharge to ultimately maintain 10mg /daily. She will discharge on baseline Trelegy therapy. She has follow up with pulmonology on 3/17 History of CAD/MI - continued Brilinta during admission T2DM - SSI during admission Hypertension - home medications give during admission. She did have intermittent hypotension possible related to lasix use which improved by date of discharge Hyperlipidemia Depression/Anxiety - home medications during admission Hypothyroidism - home medications during admission Acute urinary retention - foley placed in setting of lasix use and retention on 3/6. Removed 3/8 and voiding well by date of discharge  On date of discharge patient had appropriately progressed with therapies and met criteria for safe discharge to SNF with the support of her daughter.  I discussed discharge instructions with patient as well as return precautions and all questions and concerns were addressed.   I or a member of my team have reviewed this patient in the Controlled Substance Database.  Patient agrees to follow up as below.  Allergies as of 09/25/2023       Reactions   Avocado Anaphylaxis   Latex Shortness Of Breath, Rash   Codeine Nausea Only        Medication List     TAKE these medications    Accu-Chek Guide Test test strip Generic drug: glucose blood Use as instructed to check blood sugars once a day   Accu-Chek Guide w/Device Kit Use to check blood sugars once a day   Accu-Chek Softclix Lancets lancets Use to check blood sugars once a day   acetaminophen 500 MG tablet Commonly known as: TYLENOL Take 2 tablets (1,000 mg total) by mouth every 6 (six) hours.   alendronate 35 MG tablet Commonly known as: FOSAMAX Take 1 tablet (35 mg total) by mouth every 7 (seven) days. Take with a full glass of water on an empty stomach. Sit upright for 30-60 mins after taking What changed:  when  to take this additional instructions   amLODipine 5 MG tablet Commonly known as: NORVASC Take 1 tablet (5 mg total) by mouth daily.   aspirin EC 81 MG tablet Take 81 mg by mouth in the morning.   atorvastatin 80 MG tablet Commonly known as: LIPITOR Take 1 tablet (80 mg total) by mouth daily.   Brilinta 60 MG Tabs tablet Generic drug: ticagrelor Take 1 tablet (60 mg total) by mouth 2 (two) times daily.   busPIRone 15 MG tablet Commonly known as: BUSPAR Take 2 tablets (30 mg total) by mouth in the morning AND 1 tablet (15 mg total) daily at 12 noon AND 1 tablet (15 mg total) every evening. What changed: See the new instructions.   clonazePAM 0.5 MG disintegrating tablet Commonly known as: KLONOPIN Take 1 tablet (0.5 mg total) by mouth 2 (two) times daily.   docusate sodium 100 MG capsule Commonly known as: COLACE Take 1 capsule (100 mg total) by mouth 2 (two) times daily as needed for mild constipation.   doxepin 75 MG capsule Commonly known as: SINEQUAN Take 1 capsule (75 mg total) by mouth at bedtime.   escitalopram 20 MG tablet Commonly known as: LEXAPRO Take 1 tablet (20 mg total) by mouth daily.   ezetimibe 10 MG tablet Commonly known as: ZETIA Take 1 tablet (10 mg total) by mouth daily. What changed: when to take this   furosemide 20 MG tablet Commonly known  as: LASIX Take 1 tablet (20 mg total) by mouth daily. What changed: how much to take   gabapentin 600 MG tablet Commonly known as: NEURONTIN TAKE 1 TABLET BY MOUTH 4 TIMES DAILY What changed:  how much to take when to take this   guaiFENesin 600 MG 12 hr tablet Commonly known as: MUCINEX Take 1 tablet (600 mg total) by mouth 2 (two) times daily for 5 days.   ipratropium-albuterol 0.5-2.5 (3) MG/3ML Soln Commonly known as: DUONEB Take 3 mLs by nebulization every 6 (six) hours as needed.   levothyroxine 112 MCG tablet Commonly known as: SYNTHROID Take 1 tablet (112 mcg total) by mouth daily.    lidocaine 5 % Commonly known as: LIDODERM Place 1 patch onto the skin daily. Remove & Discard patch within 12 hours or as directed by MD   losartan 50 MG tablet Commonly known as: COZAAR Take 1.5 tablets (75 mg total) by mouth daily.   metFORMIN 500 MG tablet Commonly known as: GLUCOPHAGE Take 1 tablet (500 mg total) by mouth 2 (two) times daily with a meal.   mirtazapine 45 MG tablet Commonly known as: REMERON Take 1 tablet (45 mg total) by mouth at bedtime.   montelukast 10 MG tablet Commonly known as: SINGULAIR Take 1 tablet (10 mg total) by mouth at bedtime.   nebivolol 10 MG tablet Commonly known as: Bystolic Take 1 tablet (10 mg total) by mouth daily. What changed: when to take this   nicotine 21 mg/24hr patch Commonly known as: NICODERM CQ - dosed in mg/24 hours Place 1 patch (21 mg total) onto the skin daily.   nitroGLYCERIN 0.4 MG SL tablet Commonly known as: NITROSTAT Place 1 tablet (0.4 mg total) under the tongue every 5 (five) minutes as needed for chest pain.   omeprazole 40 MG capsule Commonly known as: PRILOSEC Take 1 capsule (40 mg total) by mouth 2 (two) times daily before a meal.   oxyCODONE 5 MG immediate release tablet Commonly known as: Oxy IR/ROXICODONE Take 1-2 tablets (5-10 mg total) by mouth every 6 (six) hours as needed for up to 5 days for severe pain (pain score 7-10) or moderate pain (pain score 4-6) (5 mg for moderate pain, 10 mg for severe pain).   polyethylene glycol 17 g packet Commonly known as: MIRALAX / GLYCOLAX Take 17 g by mouth daily as needed for severe constipation.   predniSONE 10 MG tablet Commonly known as: DELTASONE Take 2 tablets (20 mg total) by mouth daily with breakfast for 1 day, THEN 1 tablet (10 mg total) daily with breakfast for 14 days. Start taking on: September 26, 2023 What changed: See the new instructions.   Spacer/Aero-Holding [employer] Use with the albuterol inhaler.   Trelegy Ellipta 200-62.5-25  MCG/ACT Aepb Generic drug: Fluticasone-Umeclidin-Vilant Inhale 1 puff into the lungs daily.   Ventolin HFA 108 (90 Base) MCG/ACT inhaler Generic drug: albuterol INHALE 2 PUFFS into THE lungs EVERY 6 HOURS AS NEEDED FOR wheezing OR SHORTNESS OF BREATH   Zoster Vaccine Adjuvanted injection Commonly known as: SHINGRIX Inject 0.5 mLs into the muscle once for 1 dose.          Follow-up Information     Martina Sinner, MD. Schedule an appointment as soon as possible for a visit.   Specialty: Pulmonary Disease Why: follow up as scheduled on 3/17 Contact information: [address] [place] [address] [phone]         [facility].  Call.   Why: As needed. we will review your chest x ray once completed and contact you with results Contact information: [address] [place] [address] [phone]        [facility] Follow up in 2 week(s).   Why: a follow up chest x ray has been ordered for you. go to this location around 3/25 to complete. you will not need an appointment Contact information: [address] [place] [address]                Signed: Carl Best , [facility] 09/25/2023, 11:15 AM Please see Amion for pager number during day hours 7:00am-4:30pm

## 2023-09-23 NOTE — Progress Notes (Signed)
 NAME:  [name] [name], MRN:  [id number], DOB:  Feb 14, 1956, LOS: 7 ADMISSION DATE:  09/16/2023, CONSULTATION DATE:  3/6 REFERRING MD:  trauma service, CHIEF COMPLAINT:  hypoxia   History of Present Illness:  MS. Thede is a 68 y/o woman with a history of COPD and chronic respiratory failure on 3L admitted on 3/4 after a fall out of bed causing right-sided rib fractures and R pneumothorax. In the last day she has had increasing oxygen requirements. She recently was admitted from 2/17- 2/21 for COPD AE and pneumonia. She has had 4 admissions for COPD since early December 2024. Since admission she has had escalating oxygen requirements. She was started on cefepime yesterday. She has had soft blood pressures. Since admission she has been on pulmicort, yupelri, brovana, prednisone 5mg  daily, and montelukast. PTA on trelegy, prednisone. Her main complaint is her pain from rib fractures.   Pertinent  Medical History  COPD Chronic respiratory failure Tobacco abuse Hypothyroidism HTN CAD w/ MI Pre-DM GERD Anxiety & depression  Significant Hospital Events: Including procedures, antibiotic start and stop dates in addition to other pertinent events   09/16/2023 Admit  Interim History / Subjective:   She is slowly improving.  Still has fairly significant nasal congestion and postnasal drip  Objective   Blood pressure (!) 134/57, pulse 60, temperature 98.3 F (36.8 C), temperature source Oral, resp. rate 13, height 4\' 9"  (1.448 m), weight 55.5 kg, SpO2 99%.        Intake/Output Summary (Last 24 hours) at 09/23/2023 1134 Last data filed at 09/23/2023 0400 Gross per 24 hour  Intake 820 ml  Output 1050 ml  Net -230 ml   Filed Weights   09/16/23 0903 09/17/23 0523 09/18/23 0338  Weight: 69.9 kg 68.4 kg 55.5 kg   Physical Exam: General This is a pleasant 68 year old female patient she is lying in bed in no acute distress this morning HEENT normocephalic atraumatic mucous membranes are actually  quite dry posterior pharynx is erythemic no obvious yeast to her white patchy changes she has marked upper airway rhonchi Pulm scattered rhonchi no accessory use on 2 L/min portable chest x-ray personally reviewed showed right basilar atelectasis versus airspace disease improving, no obvious pneumothorax with a chest tube in place. Chest tube assessment is without airleak Abdomen soft nontender no organomegaly Extremities: Warm dry brisk cap refill Neuro: Awake and oriented no focal deficits appreciated  Resolved Hospital Problem list     Assessment & Plan:   Fall causing rib fractures RLL pneumonia (serratia)  Acute on chronic respiratory failure with hypoxia - improving R traumatic pneumothorax COPD with acute exacerbation H/o tobacco use, ongoing  Discussion  Acute on chronic resp failure 2/2 Serratia PNA, AECOPD and further c/b rib fracture and traumatic rt PTX Still w/ sig CP exacerbated by cough. Tolerating 2 lpm via [place]. CXR w/out PTX today   plan Continue triple nebulizer therapy, mucociliary clearance with Mucinex, flutter valve Treat pain Avoid constipation Continue prednisone taper (already outlined) Adding nasal antihistamine to see if this helps with postnasal drip burden BiPAP as needed for increased WOB Agree with chest tube removal that should help with pain as well She is day 7 of antibiotics would be reasonable to stop after today  Agree with palliative care consult. Code status is DNR Tobacco cessation   Rest of care per primary.  Best Practice (right click and "Reselect all SmartList Selections" daily)   Per primary   Critical care time: NA

## 2023-09-23 NOTE — Progress Notes (Signed)
 Chest tube removed per order. Pt tolerated well . Educated on bed rest .

## 2023-09-23 NOTE — Discharge Instructions (Signed)
RIB FRACTURES  HOME INSTRUCTIONS   PAIN CONTROL:  Pain is best controlled by a usual combination of three different methods TOGETHER:  Ice/Heat Over the counter pain medication Prescription pain medication You may experience some swelling and bruising in area of broken ribs. Ice packs or heating pads (30-60 minutes up to 6 times a day) will help. Use ice for the first few days to help decrease swelling and bruising, then switch to heat to help relax tight/sore spots and speed recovery. Some people prefer to use ice alone, heat alone, alternating between ice & heat. Experiment to what works for you. Swelling and bruising can take several weeks to resolve.  It is helpful to take an over-the-counter pain medication regularly for the first few weeks. Choose one of the following that works best for you:  Naproxen (Aleve, etc) Two 280m tabs twice a day Ibuprofen (Advil, etc) Three 2043mtabs four times a day (every meal & bedtime) Acetaminophen (Tylenol, etc) 500-6503mour times a day (every meal & bedtime) A prescription for pain medication (such as oxycodone, hydrocodone, etc) may be given to you upon discharge. Take your pain medication as prescribed.  If you are having problems/concerns with the prescription medicine (does not control pain, nausea, vomiting, rash, itching, etc), please call us [phone] see if we need to switch you to a different pain medicine that will work better for you and/or control your side effect better. If you need a refill on your pain medication, please contact your pharmacy. They will contact our office to request authorization. Prescriptions will not be filled after 5 pm or on week-ends. Avoid getting constipated. When taking pain medications, it is common to experience some constipation. Increasing fluid intake and taking a fiber supplement (such as Metamucil, Citrucel, FiberCon, MiraLax, etc) 1-2 times a day regularly will usually help prevent this problem  from occurring. A mild laxative (prune juice, Milk of Magnesia, MiraLax, etc) should be taken according to package directions if there are no bowel movements after 48 hours.  Watch out for diarrhea. If you have many loose bowel movements, simplify your diet to bland foods & liquids for a few days. Stop any stool softeners and decrease your fiber supplement. Switching to mild anti-diarrheal medications (Kayopectate, Pepto Bismol) can help. If this worsens or does not improve, please call us.[place]OLLOW UP  If a follow up appointment is needed one will be scheduled for you. If none is needed with our trauma team, please follow up with your primary care provider within 2-3 weeks from discharge. Please call [facility] at [phone] if you have any questions about follow up.  If you have any orthopedic or other injuries you will need to follow up as outlined in your follow up instructions.   WHEN TO CALL US [phone] pain control Reactions / problems with new medications (rash/itching, nausea, etc)  Fever over 101.5 F (38.5 C) Worsening swelling or bruising Worsening pain, productive cough, difficulty breathing or any other concerning symptoms  The clinic staff is available to answer your questions during regular business hours (8:30am-5pm). Please don't hesitate to call and ask to speak to one of our nurses for clinical concerns.  If you have a medical emergency, go to the nearest emergency room or call 911.  A surgeon from [facility] is always on call at the [facility] [place] [id number] MAIN: [phone]  [phone] ? TOLL FREE: [phone] ?  FAX [phone]  [email or web address]      Information on Rib Fractures  A rib fracture is a break or crack in one of the bones of the ribs. The ribs are long, curved bones that wrap around your chest and attach to your spine and your breastbone. The  ribs protect your heart, lungs, and other organs in the chest. A broken or cracked rib is often painful but is not usually serious. Most rib fractures heal on their own over time. However, rib fractures can be more serious if multiple ribs are broken or if broken ribs move out of place and push against other structures or organs. What are the causes? This condition is caused by: Repetitive movements with high force, such as pitching a baseball or having severe coughing spells. A direct blow to the chest, such as a sports injury, a car accident, or a fall. Cancer that has spread to the bones, which can weaken bones and cause them to break. What are the signs or symptoms? Symptoms of this condition include: Pain when you breathe in or cough. Pain when someone presses on the injured area. Feeling short of breath. How is this diagnosed? This condition is diagnosed with a physical exam and medical history. Imaging tests may also be done, such as: Chest X-ray. CT scan. MRI. Lohn scan. Chest ultrasound. How is this treated? Treatment for this condition depends on the severity of the fracture. Most rib fractures usually heal on their own in 1-3 months. Sometimes healing takes longer if there is a cough that does not stop or if there are other activities that make the injury worse (aggravating factors). While you heal, you will be given medicines to control the pain. You will also be taught deep breathing exercises. Severe injuries may require hospitalization or surgery. Follow these instructions at home: Managing pain, stiffness, and swelling If directed, apply ice to the injured area. Put ice in a plastic bag. Place a towel between your skin and the bag. Leave the ice on for 20 minutes, 2-3 times a day. Take over-the-counter and prescription medicines only as told by your health care provider. Activity Avoid a lot of activity and any activities or movements that cause pain. Be careful during  activities and avoid bumping the injured rib. Slowly increase your activity as told by your health care provider. General instructions Do deep breathing exercises as told by your health care provider. This helps prevent pneumonia, which is a common complication of a broken rib. Your health care provider may instruct you to: Take deep breaths several times a day. Try to cough several times a day, holding a pillow against the injured area. Use a device called incentive spirometer to practice deep breathing several times a day. Drink enough fluid to keep your urine pale yellow. Do not wear a rib belt or binder. These restrict breathing, which can lead to pneumonia. Keep all follow-up visits as told by your health care provider. This is important. Contact a health care provider if: You have a fever. Get help right away if: You have difficulty breathing or you are short of breath. You develop a cough that does not stop, or you cough up thick or bloody sputum. You have nausea, vomiting, or pain in your abdomen. Your pain gets worse and medicine does not help. Summary A rib fracture is a break or crack in one of the bones of the ribs. A broken or cracked rib is  often painful but is not usually serious. Most rib fractures heal on their own over time. Treatment for this condition depends on the severity of the fracture. Avoid a lot of activity and any activities or movements that cause pain. This information is not intended to replace advice given to you by your health care provider. Make sure you discuss any questions you have with your health care provider. Document Released: 07/01/2005 Document Revised: 09/30/2016 Document Reviewed: 09/30/2016 Elsevier Interactive Patient Education  2019 [employer] Inc.  

## 2023-09-23 NOTE — Progress Notes (Addendum)
 Progress Note     Subjective: Ongoing rib pain exacerbated by productive cough which is overall stable. Denies SHOB and on baseline O2 of 2L. Tolerating diet. BM yesterday  Objective: Vital signs in last 24 hours: Temp:  [97.7 F (36.5 C)-98.5 F (36.9 C)] 98.1 F (36.7 C) (03/11 0350) Pulse Rate:  [48-117] 117 (03/11 0350) Resp:  [15-18] 18 (03/11 0200) BP: (99-156)/(42-72) 156/72 (03/11 0350) SpO2:  [89 %-100 %] 99 % (03/11 0749) Last BM Date : 09/20/23  Intake/Output from previous day: 03/10 0701 - 03/11 0700 In: 820 [P.O.:720; IV Piggyback:100] Out: 1050 [Urine:1050] Intake/Output this shift: No intake/output data recorded.  PE: General: pleasant, WD, chronically ill appearing female, NAD Heart: regular, rate, and rhythm.   Lungs: CTAB. Intermittent productive cough. R CT in place with no airleak today with cough or inspiration. On WS. No output Abd: soft, NT, ND Msk: no le edema Psych: A&Ox3 with an appropriate affect.   Lab Results:  No results for input(s): "WBC", "HGB", "HCT", "PLT" in the last 72 hours.  BMET No results for input(s): "NA", "K", "CL", "CO2", "GLUCOSE", "BUN", "CREATININE", "CALCIUM" in the last 72 hours.  PT/INR No results for input(s): "LABPROT", "INR" in the last 72 hours. CMP     Component Value Date/Time   NA 137 09/19/2023 0311   NA 142 06/26/2023 1339   K 4.0 09/19/2023 0311   CL 106 09/19/2023 0311   CO2 27 09/19/2023 0311   GLUCOSE 118 (H) 09/19/2023 0311   BUN 26 (H) 09/19/2023 0311   BUN 14 06/26/2023 1339   CREATININE 1.00 09/19/2023 0311   CREATININE 0.80 03/24/2023 1232   CREATININE 0.93 04/16/2016 0937   CALCIUM 8.3 (L) 09/19/2023 0311   PROT 5.9 (L) 09/01/2023 0527   PROT 6.2 06/26/2023 1339   ALBUMIN 3.0 (L) 09/01/2023 0527   ALBUMIN 4.0 06/26/2023 1339   AST 16 09/01/2023 0527   AST 19 03/24/2023 1232   ALT 18 09/01/2023 0527   ALT 23 03/24/2023 1232   ALKPHOS 60 09/01/2023 0527   BILITOT 0.7 09/01/2023  0527   BILITOT 0.4 06/26/2023 1339   BILITOT 0.4 03/24/2023 1232   GFRNONAA >60 09/19/2023 0311   GFRNONAA >60 03/24/2023 1232   GFRNONAA 67 04/16/2016 0937   GFRAA 77 04/05/2019 0926   GFRAA 78 04/16/2016 0937   Lipase     Component Value Date/Time   LIPASE 23 02/11/2021 0416       Studies/Results: DG CHEST PORT 1 VIEW Result Date: 09/22/2023 CLINICAL DATA:  Right-sided chest tube. EXAM: PORTABLE CHEST 1 VIEW COMPARISON:  09/21/2023 FINDINGS: There is a right-sided pigtail thoracostomy tube overlying the right mid lung. No pneumothorax is visualized. Stable cardiomediastinal contours. Aortic atherosclerotic calcifications. Small left pleural effusion and bibasilar atelectasis, unchanged. IMPRESSION: 1. Right-sided pigtail thoracostomy tube overlying the right mid lung. No pneumothorax visualized. 2. Persistent small left pleural effusion and bibasilar atelectasis. Electronically Signed   By: Signa Kell M.D.   On: 09/22/2023 09:34    Anti-infectives: Anti-infectives (From admission, onward)    Start     Dose/Rate Route Frequency Ordered Stop   09/21/23 1115  cefTRIAXone (ROCEPHIN) 2 g in sodium chloride 0.9 % 100 mL IVPB        2 g 200 mL/hr over 30 Minutes Intravenous Every 24 hours 09/21/23 1022     09/18/23 0430  ceFEPIme (MAXIPIME) 2 g in sodium chloride 0.9 % 100 mL IVPB  Status:  Discontinued  2 g 200 mL/hr over 30 Minutes Intravenous Every 12 hours 09/18/23 0424 09/21/23 1022        Assessment/Plan Fall out of bed R rib fractures 4-9 - pulm toilet, IS, pain control, mobilization R PTX - pigtail placed in trauma bay.  On -40. Airleak resolved 3/7. Repeat CXR 3/9 with stable tiny right apical PTX and placed on -20,  CXR yesterday without PTX and placed on WS. Am CXR pending but if stable to improved will remove CT today - ADDENDUM: will remove CT today Acute on Chronic respiratory failure on home O2/COPD/ possible PNA - greatly appreciate CCM assistance with  this patient.  DNR/DNI now. Nebs. Sulomedrol initially. Now on prednisone taper. Lasix. Mucinex. Resp cx w/ moderate serratia marcescens-  Cefepime now transitioned to CTX.   CAD/MI - continue Brilinta DM - SSI HTN - home meds HLD Depression/Anxiety - klonopin bid, doxepin bid, lexapro qd Hypothyroidism - home meds Acute urinary retention - foley placed in setting of lasix use and retention on 3/6. Out 3/8 and voiding.  Hypotension - could be secondary to some lasix vs developing PNA.  improving.  Cont to monitor.  Home HTN meds have hold parameters FEN - carb mod diet, inc bowel regimen VTE - ASA/Brilinta/Lovenox ID - Cefepime 3/5-3/8, rocephin 3/9> she will complete 7D abx today. Recheck CBC  DISPO - 4E, cont pulmonary care and CT care.  Greatly appreciate all consultants.  Possible CT removal today and possibly medically ready for SNF as early as tomorrow.   LOS: 7 days   I reviewed Consultant pulmonology, palliative care notes, last 24 h vitals and pain scores, last 48 h intake and output, last 24 h labs and trends, and last 24 h imaging results.   Eric Form, [facility] 09/23/2023, 7:59 AM Please see Amion for pager number during day hours 7:00am-4:30pm

## 2023-09-23 NOTE — Plan of Care (Signed)
  Problem: Nutritional: Goal: Maintenance of adequate nutrition will improve Outcome: Progressing   Problem: Clinical Measurements: Goal: Respiratory complications will improve Outcome: Progressing Goal: Cardiovascular complication will be avoided Outcome: Progressing   Problem: Clinical Measurements: Goal: Cardiovascular complication will be avoided Outcome: Progressing   Problem: Activity: Goal: Risk for activity intolerance will decrease Outcome: Progressing   Problem: Coping: Goal: Level of anxiety will decrease Outcome: Progressing

## 2023-09-24 DIAGNOSIS — W19XXXA Unspecified fall, initial encounter: Secondary | ICD-10-CM | POA: Diagnosis not present

## 2023-09-24 DIAGNOSIS — Z515 Encounter for palliative care: Secondary | ICD-10-CM | POA: Diagnosis not present

## 2023-09-24 DIAGNOSIS — S2241XA Multiple fractures of ribs, right side, initial encounter for closed fracture: Secondary | ICD-10-CM | POA: Diagnosis not present

## 2023-09-24 DIAGNOSIS — S270XXA Traumatic pneumothorax, initial encounter: Secondary | ICD-10-CM | POA: Diagnosis not present

## 2023-09-24 LAB — GLUCOSE, CAPILLARY
Glucose-Capillary: 110 mg/dL — ABNORMAL HIGH (ref 70–99)
Glucose-Capillary: 172 mg/dL — ABNORMAL HIGH (ref 70–99)
Glucose-Capillary: 165 mg/dL — ABNORMAL HIGH (ref 70–99)
Glucose-Capillary: 165 mg/dL — ABNORMAL HIGH (ref 70–99)

## 2023-09-24 MED ORDER — PREDNISONE 20 MG PO TABS
20.0000 mg | ORAL_TABLET | Freq: Every day | ORAL | Status: DC
  Administered 2023-09-25: 20 mg via ORAL
  Filled 2023-09-24: qty 1

## 2023-09-24 MED ORDER — POLYETHYLENE GLYCOL 3350 17 G PO PACK
17.0000 g | PACK | Freq: Two times a day (BID) | ORAL | Status: DC
  Filled 2023-09-24 (×2): qty 1

## 2023-09-24 MED ORDER — MAGNESIUM HYDROXIDE 400 MG/5ML PO SUSP
30.0000 mL | Freq: Once | ORAL | Status: AC
  Administered 2023-09-24: 30 mL via ORAL
  Filled 2023-09-24: qty 30

## 2023-09-24 MED ORDER — PREDNISONE 10 MG PO TABS
10.0000 mg | ORAL_TABLET | Freq: Every day | ORAL | Status: DC
Start: 2023-09-27 — End: 2023-09-25

## 2023-09-24 NOTE — Progress Notes (Signed)
 Physical Therapy Treatment Patient Details Name: [name] [name] MRN: [id number] DOB: [birth year] Today's Date: 09/24/2023   History of Present Illness 68 y.o. female presents to [facility] 09/16/23 after falling out of bed with R sided rib fxs 4-9 and R PTX. Prior admit 08/31/23 w/ acute COPD exacerbation and Aspiration PNA. PMH: HTN, CAD with multiple DES, DM2, COPD, anxiety, hypothyroidism, RTC repair 12/24/2021, ongoing tobacco abuse.    PT Comments  Pt resting in bed on arrival and agreeable to session with continues progress towards acute goals. Pt able to progress gait this session with rollator for support and min A to steady with pt needing x1 seated rest due to fatigue and LE weakness. Pt able to maintain SpO2 >88% on 2L throughout mobility and HR in low 60s throughout. Patient will benefit from continued inpatient follow up therapy, <3 hours/day to maximize functional mobility and safety. Pt continues to benefit from skilled PT services to progress toward functional mobility goals.     If plan is discharge home, recommend the following: Assist for transportation;Assistance with cooking/housework;A little help with walking and/or transfers;Help with stairs or ramp for entrance   Can travel by private vehicle     No  Equipment Recommendations  None recommended by PT    Recommendations for Other Services       Precautions / Restrictions Precautions Precautions: Other (comment);Fall Recall of Precautions/Restrictions: Intact Precaution/Restrictions Comments: watch SpO2 Restrictions Weight Bearing Restrictions Per Provider Order: No     Mobility  Bed Mobility Overal bed mobility: Needs Assistance Bed Mobility: Rolling, Sidelying to Sit Rolling: Supervision Sidelying to sit: HOB elevated, Min assist       General bed mobility comments: increased time to come to sitting EOB, min A to elevate trunk    Transfers Overall transfer level: Needs assistance Equipment used: Rollator (4  wheels) Transfers: Sit to/from Stand Sit to Stand: Contact guard assist           General transfer comment: CGA for safety    Ambulation/Gait Ambulation/Gait assistance: Min assist Gait Distance (Feet): 62 Feet (+ 23') Assistive device: Rollator (4 wheels) Gait Pattern/deviations: Step-through pattern, Decreased stride length Gait velocity: decreased     General Gait Details: min A to steady with seated rest break needed due to pt stating her LEs "feel like jello"   Stairs             Wheelchair Mobility     Tilt Bed    Modified Rankin (Stroke Patients Only)       Balance Overall balance assessment: Needs assistance Sitting-balance support: No upper extremity supported, Feet supported Sitting balance-Leahy Scale: Good Sitting balance - Comments: EOB   Standing balance support: Single extremity supported, Bilateral upper extremity supported, During functional activity Standing balance-Leahy Scale: Fair Standing balance comment: reliant on external support for standing balance                            Communication Communication Communication: No apparent difficulties  Cognition Arousal: Alert Behavior During Therapy: WFL for tasks assessed/performed   PT - Cognitive impairments: No apparent impairments                       PT - Cognition Comments: Appears to be oriented and have insight into medical needs and safety Following commands: Intact      Cueing Cueing Techniques: Verbal cues  Exercises      General Comments General  comments (skin integrity, edema, etc.): SpO2 >88% on 2L during activity      Pertinent Vitals/Pain Pain Assessment Pain Assessment: Faces Faces Pain Scale: Hurts little more Pain Location: R ribs Pain Descriptors / Indicators: Aching, Discomfort Pain Intervention(s): Monitored during session, Limited activity within patient's tolerance, Repositioned    Home Living                           Prior Function            PT Goals (current goals can now be found in the care plan section) Acute Rehab PT Goals Patient Stated Goal: to get better PT Goal Formulation: With patient Time For Goal Achievement: 10/01/23 Progress towards PT goals: Progressing toward goals    Frequency    Min 3X/week      PT Plan      Co-evaluation              AM-PAC PT "6 Clicks" Mobility   Outcome Measure  Help needed turning from your back to your side while in a flat bed without using bedrails?: A Little Help needed moving from lying on your back to sitting on the side of a flat bed without using bedrails?: A Little Help needed moving to and from a bed to a chair (including a wheelchair)?: A Little Help needed standing up from a chair using your arms (e.g., wheelchair or bedside chair)?: A Little Help needed to walk in hospital room?: A Little Help needed climbing 3-5 steps with a railing? : Total 6 Click Score: 16    End of Session Equipment Utilized During Treatment: Oxygen Activity Tolerance: Patient limited by fatigue;Patient tolerated treatment well Patient left: with call bell/phone within reach;in chair Nurse Communication: Mobility status PT Visit Diagnosis: Muscle weakness (generalized) (M62.81);Unsteadiness on feet (R26.81)     Time: 1308-6578 PT Time Calculation (min) (ACUTE ONLY): 23 min  Charges:    $Gait Training: 8-22 mins $Therapeutic Activity: 8-22 mins PT General Charges $$ ACUTE PT VISIT: 1 Visit                     Tanyiah Laurich R. PTA Acute Rehabilitation Services Office: [phone]   Catalina Antigua 09/24/2023, 12:12 PM

## 2023-09-24 NOTE — Progress Notes (Signed)
 Daily Progress Note   Patient Name: [name] [name]       Date: 09/24/2023 DOB: Oct 04, 1955  Age: 68 y.o. MRN#: [id number] Attending Physician: Roslynn Amble, MD Primary Care Physician: Martina Sinner, MD Admit Date: 09/16/2023  Reason for Consultation/Follow-up: Establishing goals of care  Subjective: I have reviewed medical records including EPIC notes, MAR, any available advanced directives as necessary, and labs. CT removed yesterday.  Received report from primary RN -no acute concerns.  Per RN patient ate 75% of her breakfast, is using bedside commode, just received oxycodone for pain.  Went to visit patient at bedside - no family/visitors present.  Patient is lying in bed asleep - did not attempt to wake to preserve comfort. No signs or non-verbal gestures of pain or discomfort noted. No respiratory distress, increased work of breathing, or secretions noted.  She is on her baseline 2 L O2 [place].  Called daughter/[name] [name] for ongoing support.  She has no follow-up questions or concerns regarding conversations over the past several days.  She expresses appreciation for PMT assistance.  Plan remains for patient's discharge to rehab with outpatient palliative care to follow.  Daughter and patient are open to ongoing goals of care discussions pending her clinical course after discharge.  All questions and concerns addressed. Encouraged to call with questions and/or concerns. PMT number previously provided.  Length of Stay: 8  Current Medications: Scheduled Meds:  . acetaminophen  1,000 mg Oral Q6H  . amLODipine  5 mg Oral Daily  . arformoterol  15 mcg Nebulization BID  . aspirin EC  81 mg Oral Daily  . atorvastatin  80 mg Oral Daily  . azelastine  2 spray Each Nare BID  . budesonide (PULMICORT)  nebulizer solution  0.5 mg Nebulization BID  . busPIRone  30 mg Oral q AM   And  . busPIRone  15 mg Oral Q1200   And  . busPIRone  15 mg Oral QPM  . clonazePAM  0.5 mg Oral BID  . docusate sodium  100 mg Oral BID  . doxepin  75 mg Oral QHS  . enoxaparin (LOVENOX) injection  30 mg Subcutaneous Q12H  . escitalopram  20 mg Oral Daily  . ezetimibe  10 mg Oral QHS  . furosemide  20 mg Oral Daily  . gabapentin  600 mg  Oral BID  . guaiFENesin  600 mg Oral BID  . insulin aspart  0-15 Units Subcutaneous TID WC  . insulin aspart  0-5 Units Subcutaneous QHS  . levothyroxine  112 mcg Oral Daily  . lidocaine  1 patch Transdermal Q24H  . losartan  75 mg Oral Daily  . mirtazapine  45 mg Oral QHS  . montelukast  10 mg Oral QHS  . nebivolol  10 mg Oral QHS  . nicotine  21 mg Transdermal Daily  . polyethylene glycol  17 g Oral Daily  . predniSONE  20 mg Oral Q breakfast   Followed by  . [START ON 09/27/2023] predniSONE  10 mg Oral Q breakfast  . revefenacin  175 mcg Nebulization Daily  . sodium chloride flush  10-40 mL Intracatheter Q12H  . ticagrelor  60 mg Oral BID    Continuous Infusions: . cefTRIAXone (ROCEPHIN)  IV 2 g (09/23/23 1210)    PRN Meds: albuterol, bisacodyl, guaiFENesin-dextromethorphan, hydrALAZINE, HYDROmorphone (DILAUDID) injection, metoprolol tartrate, oxyCODONE, prochlorperazine, sodium chloride flush  Physical Exam Vitals and nursing note reviewed.  Constitutional:      General: She is not in acute distress.    Appearance: She is ill-appearing.  Pulmonary:     Effort: No respiratory distress.  Skin:    General: Skin is warm and dry.  Neurological:     Motor: Weakness present.     Comments: asleep             Vital Signs: BP 137/60   Pulse (!) 58   Temp 98.7 F (37.1 C) (Oral)   Resp 14   Ht 4\' 9"  (1.448 m)   Wt 55.5 kg   SpO2 94%   BMI 26.48 kg/m  SpO2: SpO2: 94 % O2 Device: O2 Device: Nasal Cannula O2 Flow Rate: O2 Flow Rate (L/min): 2  L/min  Intake/output summary: No intake or output data in the 24 hours ending 09/24/23 0953 LBM: Last BM Date : 09/21/23 Baseline Weight: Weight: 69.9 kg Most recent weight: Weight: 55.5 kg       Palliative Assessment/Data: PPS 50-60%      Patient Active Problem List   Diagnosis Date Noted  . Pneumothorax, right 09/16/2023  . COPD with acute exacerbation (HCC) 09/01/2023  . Chronic hypoxic respiratory failure (HCC) 09/01/2023  . Continuous dependence on cigarette smoking 09/01/2023  . History of CAD (coronary artery disease) 09/01/2023  . CAP (community acquired pneumonia) 09/01/2023  . Infection due to parainfluenza virus 4 06/20/2023  . Full code status 06/14/2023  . Acute hypoxic respiratory failure (HCC) 06/13/2023  . Acute exacerbation of chronic obstructive pulmonary disease (COPD) (HCC) 06/12/2023  . Nicotine dependence 05/03/2023  . Substance abuse (HCC) 09/10/2022  . Iron deficiency anemia 11/02/2021  . Major depressive disorder, recurrent, in full remission with anxious distress (HCC) 05/31/2021  . Chronic neck pain 02/26/2021  . GAD (generalized anxiety disorder) 01/08/2021  . Esophageal dysphagia 04/28/2019  . Bipolar I disorder (HCC) 09/17/2018  . Insomnia 10/28/2017  . Prediabetes 05/28/2017  . QT prolongation 03/25/2017  . Psoriasis 06/22/2015  . COPD (chronic obstructive pulmonary disease) (HCC)GOLD E 05/18/2015  . Anxiety and depression 07/10/2013  . Essential hypertension 06/07/2013  . Hyperlipidemia 06/07/2013  . Asthma, chronic 06/07/2013  . Emphysema lung (HCC) 06/07/2013  . Chronic back pain 06/07/2013  . CAD S/P percutaneous coronary angioplasty 06/07/2013  . GERD (gastroesophageal reflux disease) 06/07/2013  . Breast lump on left side at 3 o'clock position 10/13/2012  . S/P abdominal  hysterectomy and right salpingo-oophorectomy 08/29/2011  . COPD exacerbation (HCC) 09/15/2009  . TOBACCO ABUSE 07/17/2009  . Chronic rhinitis 07/17/2009  . Lung  nodule < 6cm on CT 07/17/2009  . Hypothyroidism 12/22/2008  . Chronic depression 12/22/2008    Palliative Care Assessment & Plan   Patient Profile: 68 y.o. female  with past medical history of chronic respiratory failure on 2L O2 [place] at home, COPD with recent exacerbation/multiple hospitalizations, HTN, HLD, CAD/MI with stents on plavix and ASA, DM, depression/anxiety, and hypothyroidism  admitted on 09/16/2023 with right-sided rib fractures and a R PTX. Chest tube placed. Also treating for possible pneumonia. PMT consulted to discuss GOC.    Of note, she has had 5 admissions and 1 ED visit in the last 6 months.   Assessment: Principal Problem:   Pneumothorax, right   Concern about end of life  Recommendations/Plan: Continue to treat the treatable Continue DNR/DNI as previously documented Goal is for discharge to SNF rehab with outpatient palliative care to follow PMT will continue to follow peripherally. If there are any imminent needs please call the service directly  Goals of Care and Additional Recommendations: Limitations on Scope of Treatment: No Artificial Feeding  Code Status:    Code Status Orders  (From admission, onward)           Start     Ordered   09/18/23 1613  Do not attempt resuscitation (DNR)- Limited -Do Not Intubate (DNI)  (Code Status)  Continuous       Question Answer Comment  If pulseless and not breathing No CPR or chest compressions.   In Pre-Arrest Conditions (Patient Is Breathing and Has A Pulse) Do not intubate. Provide all appropriate non-invasive medical interventions. Avoid ICU transfer unless indicated or required.   Consent: Discussion documented in EHR or advanced directives reviewed      09/18/23 1612           Code Status History     Date Active Date Inactive Code Status Order ID Comments User Context   09/16/2023 1333 09/18/2023 1612 Full Code [id number]  Violeta Gelinas, MD ED   09/01/2023 0051 09/05/2023 2311 Full Code [id number]   Tereasa Coop, MD ED   07/21/2023 2105 07/23/2023 1856 Full Code [id number]  Modena Slater, DO ED   07/12/2023 0943 07/15/2023 1649 Full Code [id number]  Reymundo Poll, MD ED   06/12/2023 1008 06/20/2023 2233 Full Code [id number]  Masters, Katie, DO ED   09/15/2017 1951 09/16/2017 2159 Full Code [id number]  Nyra Market, MD ED   03/25/2017 1459 03/27/2017 1726 Full Code [id number]  Ozella Rocks, MD ED   03/25/2017 1459 03/25/2017 1459 Full Code [id number]  Ozella Rocks, MD ED   10/26/2015 2303 10/30/2015 1500 Full Code [id number]  Alberteen Sam, MD Inpatient   07/09/2013 1614 07/10/2013 0339 Full Code [id number]  Arthor Captain, PA-C ED      Advance Directive Documentation    Flowsheet Row Most Recent Value  Type of Advance Directive Healthcare Power of Attorney, Living will  Pre-existing out of facility DNR order (yellow form or pink MOST form) --  "MOST" Form in Place? --       Prognosis:  Poor long term in the setting of advanced age, recurrent hospitalizations, and multiple comorbidities  Discharge Planning: Skilled Nursing Facility for rehab with Palliative care service follow-up  Care plan was discussed with primary RN, patient's daughter  Thank you for allowing the Palliative Medicine Team to assist in the care of this  patient.   Total Time 35 minutes Prolonged Time Billed  no       Haskel Khan, NP  Please contact Palliative Medicine Team phone at [phone] for questions and concerns.   *Portions of this note are a verbal dictation therefore any spelling and/or grammatical errors are due to the "[employer]agon [employer] One" system interpretation.

## 2023-09-24 NOTE — TOC Progression Note (Addendum)
 Transition of Care [facility]) - Progression Note    Patient Details  Name: [name] [name] MRN: [id number] Date of Birth: [birth year]  [facility] CM/SW Contact  Glennon Mac, RN Phone Number: 09/24/2023, 10:04 AM  Clinical Narrative:    Patient and family have accepted SNF bed at [facility].  Bed available pending medical stability and insurance authorization.    Addendum 12:12pm Patient medically stable for discharge, per provider.  Insurance authorization has been initiated; [employer] ID [id number].     Expected Discharge Plan: Skilled Nursing Facility Barriers to Discharge: Continued Medical Work up  Expected Discharge Plan and Services   Discharge Planning Services: CM Consult Post Acute Care Choice: Resumption of Svcs/PTA Provider Living arrangements for the past 2 months: Apartment                                       Social Determinants of Health (SDOH) Interventions SDOH Screenings   Food Insecurity: No Food Insecurity (09/16/2023)  Recent Concern: Food Insecurity - Food Insecurity Present (08/07/2023)  Housing: Low Risk  (09/16/2023)  Transportation Needs: No Transportation Needs (09/16/2023)  Utilities: Not At Risk (09/16/2023)  Alcohol Screen: Low Risk  (08/07/2023)  Depression (PHQ2-9): Medium Risk (08/07/2023)  Financial Resource Strain: Medium Risk (08/07/2023)  Physical Activity: Inactive (08/07/2023)  Social Connections: Moderately Isolated (09/16/2023)  Stress: No Stress Concern Present (08/07/2023)  Tobacco Use: High Risk (09/16/2023)  Health Literacy: Inadequate Health Literacy (08/07/2023)    Readmission Risk Interventions    06/20/2023    3:17 PM  Readmission Risk Prevention Plan  Transportation Screening Complete  HRI or Home Care Consult Complete  Social Work Consult for Recovery Care Planning/Counseling Complete  Palliative Care Screening Not Applicable  Medication Review (RN Care Manager) Complete   Quintella Baton, RN, BSN  Trauma/Neuro ICU Case Manager [phone]

## 2023-09-24 NOTE — Progress Notes (Addendum)
 NAME:  [name] [name], MRN:  [id number], DOB:  February 19, 1956, LOS: 8 ADMISSION DATE:  09/16/2023, CONSULTATION DATE:  3/6 REFERRING MD:  trauma service, CHIEF COMPLAINT:  hypoxia   History of Present Illness:  [name] [name] is a 68 y/o woman with a history of COPD and chronic respiratory failure on 3L admitted on 3/4 after a fall out of bed causing right-sided rib fractures and R pneumothorax. In the last day she has had increasing oxygen requirements. She recently was admitted from 2/17- 2/21 for COPD AE and pneumonia. She has had 4 admissions for COPD since early December 2024. Since admission she has had escalating oxygen requirements. She was started on cefepime yesterday. She has had soft blood pressures. Since admission she has been on pulmicort, yupelri, brovana, prednisone 5mg  daily, and montelukast. PTA on trelegy, prednisone. Her main complaint is her pain from rib fractures.   Pertinent  Medical History  COPD Chronic respiratory failure Tobacco abuse Hypothyroidism HTN CAD w/ MI Pre-DM GERD Anxiety & depression  Significant Hospital Events: Including procedures, antibiotic start and stop dates in addition to other pertinent events   09/16/2023 Admit  Interim History / Subjective:   Chest tubes removed yesterday.  States that her breathing is slowly improving No new complaints today  Objective   Blood pressure 113/61, pulse (!) 56, temperature 98.2 F (36.8 C), temperature source Oral, resp. rate 14, height 4\' 9"  (1.448 m), weight 55.5 kg, SpO2 90%.       No intake or output data in the 24 hours ending 09/24/23 1457  Filed Weights   09/16/23 0903 09/17/23 0523 09/18/23 0338  Weight: 69.9 kg 68.4 kg 55.5 kg   Physical Exam: Gen:      No acute distress HEENT:  EOMI, sclera anicteric Neck:     No masses; no thyromegaly Lungs:    Clear to auscultation bilaterally; normal respiratory effort CV:         Regular rate and rhythm; no murmurs Abd:      + bowel sounds; soft,  non-tender; no palpable masses, no distension Ext:    No edema; adequate peripheral perfusion Skin:      Warm and dry; no rash Neuro: alert and oriented x 3 Psych: normal mood and affect    Resolved Hospital Problem list     Assessment & Plan:   Fall causing rib fractures RLL pneumonia (serratia)  Acute on chronic respiratory failure with hypoxia - improving R traumatic pneumothorax COPD with acute exacerbation H/o tobacco use, ongoing  Discussion  Acute on chronic resp failure 2/2 Serratia PNA, AECOPD and further c/b rib fracture and traumatic rt PTX Still w/ sig CP exacerbated by cough. Tolerating 2 lpm via [place]. CXR w/out PTX today   plan Continue triple nebulizer therapy, mucociliary clearance with Mucinex, flutter valve Can change to Trelegy 200 on discharge Treat pain Continue prednisone taper.  When she reaches 10 mg/day of prednisone please maintain at that dose without further taper until she can be seen back in pulmonary clinic Finish 7 days of antibiotics for community-acquired pneumonia Agree with palliative care consult. Code status is DNR Tobacco cessation She is currently DNR and will be followed by outpatient palliative care.  She already has appointment in pulmonary clinic on 3/17 PCCM will be available as needed.  Please call with any questions.  Best Practice (right click and "Reselect all SmartList Selections" daily)   Per primary  Critical care time: NA   Chilton Greathouse MD [facility] See Amion for pager  If no response to pager , please call [phone] until 7pm After 7:00 pm call Elink  [phone] 09/24/2023, 3:09 PM

## 2023-09-24 NOTE — Progress Notes (Addendum)
 Progress Note     Subjective: Stable rib pain but gradually improving. Denies SOB or change in breathing since chest tube removal. Denies abdominal pain but no BM in several days, this is her typical pattern at home as well.   Objective: Vital signs in last 24 hours: Temp:  [97.7 F (36.5 C)-98.7 F (37.1 C)] 98.7 F (37.1 C) (03/12 0758) Pulse Rate:  [53-67] 58 (03/12 0950) Resp:  [13-16] 14 (03/12 0905) BP: (108-153)/(53-74) 137/60 (03/12 0905) SpO2:  [90 %-96 %] 94 % (03/12 0950) Last BM Date : 09/21/23  Intake/Output from previous day: No intake/output data recorded. Intake/Output this shift: No intake/output data recorded.  PE: General: pleasant, WD, chronically ill appearing female, NAD Heart: regular, rate, and rhythm.   Lungs: CTAB. On supplemental O2 @2L /min per [place] Abd: soft, NT, ND Msk: no LE edema Psych: A&Ox3 with an appropriate affect.   Lab Results:  Recent Labs    09/23/23 0950  WBC 13.4*  HGB 12.1  HCT 37.3  PLT 443*    BMET No results for input(s): "NA", "K", "CL", "CO2", "GLUCOSE", "BUN", "CREATININE", "CALCIUM" in the last 72 hours.  PT/INR No results for input(s): "LABPROT", "INR" in the last 72 hours. CMP     Component Value Date/Time   NA 137 09/19/2023 0311   NA 142 06/26/2023 1339   K 4.0 09/19/2023 0311   CL 106 09/19/2023 0311   CO2 27 09/19/2023 0311   GLUCOSE 118 (H) 09/19/2023 0311   BUN 26 (H) 09/19/2023 0311   BUN 14 06/26/2023 1339   CREATININE 1.00 09/19/2023 0311   CREATININE 0.80 03/24/2023 1232   CREATININE 0.93 04/16/2016 0937   CALCIUM 8.3 (L) 09/19/2023 0311   PROT 5.9 (L) 09/01/2023 0527   PROT 6.2 06/26/2023 1339   ALBUMIN 3.0 (L) 09/01/2023 0527   ALBUMIN 4.0 06/26/2023 1339   AST 16 09/01/2023 0527   AST 19 03/24/2023 1232   ALT 18 09/01/2023 0527   ALT 23 03/24/2023 1232   ALKPHOS 60 09/01/2023 0527   BILITOT 0.7 09/01/2023 0527   BILITOT 0.4 06/26/2023 1339   BILITOT 0.4 03/24/2023 1232    GFRNONAA >60 09/19/2023 0311   GFRNONAA >60 03/24/2023 1232   GFRNONAA 67 04/16/2016 0937   GFRAA 77 04/05/2019 0926   GFRAA 78 04/16/2016 0937   Lipase     Component Value Date/Time   LIPASE 23 02/11/2021 0416       Studies/Results: DG CHEST PORT 1 VIEW Result Date: 09/23/2023 CLINICAL DATA:  Right pneumothorax EXAM: PORTABLE CHEST 1 VIEW COMPARISON:  09/23/2023 at 5:44 a.m. FINDINGS: Interval removal of the right pleural drainage catheter. No current well-defined pneumothorax. Mild bibasilar airspace opacity similar to prior. Faint bandlike opacity in the right mid lung, probably from atelectasis or scarring. Atherosclerotic calcification of the aortic arch. Old healed left clavicular fracture. The patient has known right-sided rib fractures. IMPRESSION: 1. Interval removal of the right pleural drainage catheter. No visible pneumothorax. 2. Mild bibasilar airspace opacity similar to prior. 3. Faint bandlike opacity in the right mid lung, probably from atelectasis or scarring. 4. Aortic Atherosclerosis (ICD10-I70.0). Electronically Signed   By: Gaylyn Rong M.D.   On: 09/23/2023 20:24   DG CHEST PORT 1 VIEW Result Date: 09/23/2023 CLINICAL DATA:  Follow-up right chest tube EXAM: PORTABLE CHEST 1 VIEW COMPARISON:  Film from the previous day. FINDINGS: Cardiac shadow is stable. Right-sided pigtail catheter is again noted. No pneumothorax or sizable pleural effusion is noted. Minimal basilar  atelectasis is noted bilaterally. IMPRESSION: No significant interval change from the prior exam. Electronically Signed   By: Alcide Clever M.D.   On: 09/23/2023 10:21    Anti-infectives: Anti-infectives (From admission, onward)    Start     Dose/Rate Route Frequency Ordered Stop   09/21/23 1115  cefTRIAXone (ROCEPHIN) 2 g in sodium chloride 0.9 % 100 mL IVPB        2 g 200 mL/hr over 30 Minutes Intravenous Every 24 hours 09/21/23 1022     09/18/23 0430  ceFEPIme (MAXIPIME) 2 g in sodium chloride  0.9 % 100 mL IVPB  Status:  Discontinued        2 g 200 mL/hr over 30 Minutes Intravenous Every 12 hours 09/18/23 0424 09/21/23 1022        Assessment/Plan Fall out of bed R rib fractures 4-9 - pulm toilet, IS, pain control, mobilization R PTX - pigtail placed in trauma bay. Airleak resolved 3/7. R CT removed yesterday and follow up CXR was stable Acute on Chronic respiratory failure on home O2/COPD/ possible PNA - greatly appreciate CCM assistance with this patient.  DNR/DNI now. Nebs. Sulomedrol initially. Now on prednisone taper. Lasix. Mucinex. Resp cx w/ moderate serratia marcescens-  Cefepime now transitioned to CTX. Stop ABX today   CAD/MI - continue Brilinta DM - SSI HTN - home meds HLD Depression/Anxiety - klonopin bid, doxepin bid, lexapro qd Hypothyroidism - home meds Acute urinary retention - resolved Hypotension - improving.  Cont to monitor.  Home HTN meds have hold parameters  FEN - carb mod diet, inc bowel regimen VTE - ASA/Brilinta/Lovenox ID - Cefepime 3/5-3/8, rocephin 3/9>3/12  DISPO - 4E, cont pulmonary care.  Greatly appreciate all consultants. Medically stable for DC to SNF    LOS: 8 days   I reviewed Consultant pulmonology, palliative care notes, last 24 h vitals and pain scores, last 48 h intake and output, last 24 h labs and trends, and last 24 h imaging results.   Juliet Rude, [facility] Surgery 09/24/2023, 10:29 AM Please see Amion for pager number during day hours 7:00am-4:30pm

## 2023-09-25 DIAGNOSIS — Z66 Do not resuscitate: Secondary | ICD-10-CM | POA: Diagnosis not present

## 2023-09-25 DIAGNOSIS — E1142 Type 2 diabetes mellitus with diabetic polyneuropathy: Secondary | ICD-10-CM | POA: Diagnosis not present

## 2023-09-25 DIAGNOSIS — R0989 Other specified symptoms and signs involving the circulatory and respiratory systems: Secondary | ICD-10-CM | POA: Diagnosis not present

## 2023-09-25 DIAGNOSIS — I10 Essential (primary) hypertension: Secondary | ICD-10-CM | POA: Diagnosis not present

## 2023-09-25 DIAGNOSIS — J9811 Atelectasis: Secondary | ICD-10-CM | POA: Diagnosis not present

## 2023-09-25 DIAGNOSIS — F17210 Nicotine dependence, cigarettes, uncomplicated: Secondary | ICD-10-CM | POA: Diagnosis not present

## 2023-09-25 DIAGNOSIS — J939 Pneumothorax, unspecified: Secondary | ICD-10-CM | POA: Diagnosis not present

## 2023-09-25 DIAGNOSIS — Z7401 Bed confinement status: Secondary | ICD-10-CM | POA: Diagnosis not present

## 2023-09-25 DIAGNOSIS — J440 Chronic obstructive pulmonary disease with acute lower respiratory infection: Secondary | ICD-10-CM | POA: Diagnosis not present

## 2023-09-25 DIAGNOSIS — I700 Atherosclerosis of aorta: Secondary | ICD-10-CM | POA: Diagnosis not present

## 2023-09-25 DIAGNOSIS — J441 Chronic obstructive pulmonary disease with (acute) exacerbation: Secondary | ICD-10-CM | POA: Diagnosis not present

## 2023-09-25 DIAGNOSIS — J189 Pneumonia, unspecified organism: Secondary | ICD-10-CM | POA: Diagnosis not present

## 2023-09-25 DIAGNOSIS — F3342 Major depressive disorder, recurrent, in full remission: Secondary | ICD-10-CM | POA: Diagnosis not present

## 2023-09-25 DIAGNOSIS — R0602 Shortness of breath: Secondary | ICD-10-CM | POA: Diagnosis not present

## 2023-09-25 DIAGNOSIS — J449 Chronic obstructive pulmonary disease, unspecified: Secondary | ICD-10-CM | POA: Diagnosis not present

## 2023-09-25 DIAGNOSIS — F419 Anxiety disorder, unspecified: Secondary | ICD-10-CM | POA: Diagnosis present

## 2023-09-25 DIAGNOSIS — S2241XA Multiple fractures of ribs, right side, initial encounter for closed fracture: Secondary | ICD-10-CM | POA: Diagnosis not present

## 2023-09-25 DIAGNOSIS — E785 Hyperlipidemia, unspecified: Secondary | ICD-10-CM | POA: Diagnosis not present

## 2023-09-25 DIAGNOSIS — Z9981 Dependence on supplemental oxygen: Secondary | ICD-10-CM | POA: Diagnosis not present

## 2023-09-25 DIAGNOSIS — J90 Pleural effusion, not elsewhere classified: Secondary | ICD-10-CM | POA: Diagnosis not present

## 2023-09-25 DIAGNOSIS — R531 Weakness: Secondary | ICD-10-CM | POA: Diagnosis not present

## 2023-09-25 DIAGNOSIS — Z79890 Hormone replacement therapy (postmenopausal): Secondary | ICD-10-CM | POA: Diagnosis not present

## 2023-09-25 DIAGNOSIS — J9611 Chronic respiratory failure with hypoxia: Secondary | ICD-10-CM | POA: Diagnosis not present

## 2023-09-25 DIAGNOSIS — Z1152 Encounter for screening for COVID-19: Secondary | ICD-10-CM | POA: Diagnosis not present

## 2023-09-25 DIAGNOSIS — Z8679 Personal history of other diseases of the circulatory system: Secondary | ICD-10-CM | POA: Diagnosis not present

## 2023-09-25 DIAGNOSIS — D509 Iron deficiency anemia, unspecified: Secondary | ICD-10-CM | POA: Diagnosis not present

## 2023-09-25 DIAGNOSIS — R338 Other retention of urine: Secondary | ICD-10-CM | POA: Diagnosis not present

## 2023-09-25 DIAGNOSIS — F411 Generalized anxiety disorder: Secondary | ICD-10-CM | POA: Diagnosis not present

## 2023-09-25 DIAGNOSIS — F319 Bipolar disorder, unspecified: Secondary | ICD-10-CM | POA: Diagnosis present

## 2023-09-25 DIAGNOSIS — E1122 Type 2 diabetes mellitus with diabetic chronic kidney disease: Secondary | ICD-10-CM | POA: Diagnosis not present

## 2023-09-25 DIAGNOSIS — E119 Type 2 diabetes mellitus without complications: Secondary | ICD-10-CM | POA: Diagnosis not present

## 2023-09-25 DIAGNOSIS — J9621 Acute and chronic respiratory failure with hypoxia: Secondary | ICD-10-CM | POA: Diagnosis not present

## 2023-09-25 DIAGNOSIS — T380X5A Adverse effect of glucocorticoids and synthetic analogues, initial encounter: Secondary | ICD-10-CM | POA: Diagnosis present

## 2023-09-25 DIAGNOSIS — R059 Cough, unspecified: Secondary | ICD-10-CM | POA: Diagnosis not present

## 2023-09-25 DIAGNOSIS — Z79899 Other long term (current) drug therapy: Secondary | ICD-10-CM | POA: Diagnosis not present

## 2023-09-25 DIAGNOSIS — R278 Other lack of coordination: Secondary | ICD-10-CM | POA: Diagnosis not present

## 2023-09-25 DIAGNOSIS — J439 Emphysema, unspecified: Secondary | ICD-10-CM | POA: Diagnosis present

## 2023-09-25 DIAGNOSIS — I9589 Other hypotension: Secondary | ICD-10-CM | POA: Diagnosis not present

## 2023-09-25 DIAGNOSIS — S270XXA Traumatic pneumothorax, initial encounter: Secondary | ICD-10-CM | POA: Diagnosis not present

## 2023-09-25 DIAGNOSIS — E66813 Obesity, class 3: Secondary | ICD-10-CM | POA: Diagnosis present

## 2023-09-25 DIAGNOSIS — E039 Hypothyroidism, unspecified: Secondary | ICD-10-CM | POA: Diagnosis not present

## 2023-09-25 DIAGNOSIS — I272 Pulmonary hypertension, unspecified: Secondary | ICD-10-CM | POA: Diagnosis not present

## 2023-09-25 DIAGNOSIS — I2510 Atherosclerotic heart disease of native coronary artery without angina pectoris: Secondary | ICD-10-CM | POA: Diagnosis not present

## 2023-09-25 DIAGNOSIS — W19XXXD Unspecified fall, subsequent encounter: Secondary | ICD-10-CM | POA: Diagnosis present

## 2023-09-25 DIAGNOSIS — Z955 Presence of coronary angioplasty implant and graft: Secondary | ICD-10-CM | POA: Diagnosis not present

## 2023-09-25 DIAGNOSIS — S2241XD Multiple fractures of ribs, right side, subsequent encounter for fracture with routine healing: Secondary | ICD-10-CM | POA: Diagnosis not present

## 2023-09-25 DIAGNOSIS — J432 Centrilobular emphysema: Secondary | ICD-10-CM | POA: Diagnosis not present

## 2023-09-25 LAB — GLUCOSE, CAPILLARY
Glucose-Capillary: 86 mg/dL (ref 70–99)
Glucose-Capillary: 127 mg/dL — ABNORMAL HIGH (ref 70–99)

## 2023-09-25 MED ORDER — OXYCODONE HCL 5 MG PO TABS: 5.0000 mg | ORAL_TABLET | Freq: Four times a day (QID) | ORAL | 0 refills | Status: AC | PRN

## 2023-09-25 MED ORDER — GUAIFENESIN ER 600 MG PO TB12: 600.0000 mg | ORAL_TABLET | Freq: Two times a day (BID) | ORAL | Status: AC

## 2023-09-25 MED ORDER — ACETAMINOPHEN 500 MG PO TABS: 1000.0000 mg | ORAL_TABLET | Freq: Four times a day (QID) | ORAL | Status: DC

## 2023-09-25 MED ORDER — PREDNISONE 10 MG PO TABS: ORAL_TABLET | ORAL | Status: DC

## 2023-09-25 MED ORDER — DOCUSATE SODIUM 100 MG PO CAPS: 100.0000 mg | ORAL_CAPSULE | Freq: Two times a day (BID) | ORAL | Status: DC | PRN

## 2023-09-25 MED ORDER — POLYETHYLENE GLYCOL 3350 17 G PO PACK: 17.0000 g | PACK | Freq: Every day | ORAL | Status: DC | PRN

## 2023-09-25 MED ORDER — LIDOCAINE 5 % EX PTCH: 1.0000 | MEDICATED_PATCH | CUTANEOUS | Status: DC

## 2023-09-25 NOTE — Progress Notes (Signed)
 Pt ready for DC to SNF.  Have attempted report x's 2 at [phone].  Unable to reach receiving RN.

## 2023-09-25 NOTE — Progress Notes (Signed)
 Mobility Specialist Progress Note:   09/25/23 1106  Mobility  Activity Ambulated with assistance in room;Ambulated with assistance in hallway;Transferred from bed to chair  Level of Assistance Minimal assist, patient does 75% or more  Assistive Device Front wheel walker  Distance Ambulated (ft) 180 ft  Activity Response Tolerated well  Mobility Referral Yes  Mobility visit 1 Mobility  Mobility Specialist Start Time (ACUTE ONLY) 1050  Mobility Specialist Stop Time (ACUTE ONLY) 1100  Mobility Specialist Time Calculation (min) (ACUTE ONLY) 10 min   Pt received in bed, agreeable to mobility session. MinA required to sit EOB, SBA during STS and ambulation. Ambulated in hallway with RW, agreeable to sit in chair. SpO2 WFL throughout session. Pt c/o chest pain from rib fx. Pt sitting comfortably in chair with all needs met.  Feliciana Rossetti Mobility Specialist Please contact via Special educational needs teacher or  Rehab office at [phone]

## 2023-09-25 NOTE — TOC Transition Note (Signed)
 Transition of Care [facility]) - Discharge Note   Patient Details  Name: ARNICE VANEPPS MRN: [id number] Date of Birth: 01/16/1956  [facility] CM/SW Contact:  Glennon Mac, RN Phone Number: 09/25/2023, 11:50 AM   Clinical Narrative:    Patient medically stable for discharge to SNF today, per attending, and authorization has been received from insurance for SNF stay. Patient approved 3/12 - 3/14 NRD 3/14 Plan Auth [id number].   Discharge summary and transfer report forwarded to facility.  Notified patient's daughter of receiving auth.   Patient is going to room 3214; bedside nurse will need to call report to [phone], extension [phone].    PTAR notified for transport at 12:51pm; bedside nurse to notify daughter when patient gets picked up.      Final next level of care: Skilled Nursing Facility Barriers to Discharge: Barriers Resolved   Patient Goals and CMS Choice   CMS Medicare.gov Compare Post Acute Care list provided to:: Patient Choice offered to / list presented to : Patient, Adult Children      Discharge Placement PASRR number recieved: 09/22/23            Patient [facility] bed at: [facility] Patient to be transferred to facility by: PTAR Name of family member notified: Binnie Kand, daughter Patient and family notified of of transfer: 09/25/23  Discharge Plan and Services Additional resources added to the After Visit Summary for  NA   Discharge Planning Services: CM Consult Post Acute Care Choice: Skilled Nursing Facility                               Social Drivers of Health (SDOH) Interventions SDOH Screenings   Food Insecurity: No Food Insecurity (09/16/2023)  Recent Concern: Food Insecurity - Food Insecurity Present (08/07/2023)  Housing: Low Risk  (09/16/2023)  Transportation Needs: No Transportation Needs (09/16/2023)  Utilities: Not At Risk (09/16/2023)  Alcohol Screen: Low Risk  (08/07/2023)  Depression (PHQ2-9): Medium  Risk (08/07/2023)  Financial Resource Strain: Medium Risk (08/07/2023)  Physical Activity: Inactive (08/07/2023)  Social Connections: Moderately Isolated (09/16/2023)  Stress: No Stress Concern Present (08/07/2023)  Tobacco Use: High Risk (09/16/2023)  Health Literacy: Inadequate Health Literacy (08/07/2023)     Readmission Risk Interventions    06/20/2023    3:17 PM  Readmission Risk Prevention Plan  Transportation Screening Complete  HRI or Home Care Consult Complete  Social Work Consult for Recovery Care Planning/Counseling Complete  Palliative Care Screening Not Applicable  Medication Review (RN Care Manager) Complete   Quintella Baton, RN, BSN  Trauma/Neuro ICU Case Manager [phone]

## 2023-09-25 NOTE — Progress Notes (Signed)
 Progress Note     Subjective: No significant changes in last 24 hours. Ongoing rib pain. Tolerating diet  Objective: Vital signs in last 24 hours: Temp:  [97.7 F (36.5 C)-98.7 F (37.1 C)] 98.1 F (36.7 C) (03/13 0325) Pulse Rate:  [53-66] 62 (03/13 0325) Resp:  [13-18] 17 (03/13 0325) BP: (110-140)/(57-92) 131/92 (03/13 0325) SpO2:  [90 %-97 %] 96 % (03/13 0325) Last BM Date : 09/24/23  Intake/Output from previous day: 03/12 0701 - 03/13 0700 In: 10 [I.V.:10] Out: -  Intake/Output this shift: No intake/output data recorded.  PE: General: pleasant, WD, chronically ill appearing female, NAD Heart: regular, rate, and rhythm.   Lungs: CTAB. On supplemental O2 @2L /min per [place] Abd: soft, NT, ND Msk: no LE edema Psych: A&Ox3 with an appropriate affect.   Lab Results:  Recent Labs    09/23/23 0950  WBC 13.4*  HGB 12.1  HCT 37.3  PLT 443*    BMET No results for input(s): "NA", "K", "CL", "CO2", "GLUCOSE", "BUN", "CREATININE", "CALCIUM" in the last 72 hours.  PT/INR No results for input(s): "LABPROT", "INR" in the last 72 hours. CMP     Component Value Date/Time   NA 137 09/19/2023 0311   NA 142 06/26/2023 1339   K 4.0 09/19/2023 0311   CL 106 09/19/2023 0311   CO2 27 09/19/2023 0311   GLUCOSE 118 (H) 09/19/2023 0311   BUN 26 (H) 09/19/2023 0311   BUN 14 06/26/2023 1339   CREATININE 1.00 09/19/2023 0311   CREATININE 0.80 03/24/2023 1232   CREATININE 0.93 04/16/2016 0937   CALCIUM 8.3 (L) 09/19/2023 0311   PROT 5.9 (L) 09/01/2023 0527   PROT 6.2 06/26/2023 1339   ALBUMIN 3.0 (L) 09/01/2023 0527   ALBUMIN 4.0 06/26/2023 1339   AST 16 09/01/2023 0527   AST 19 03/24/2023 1232   ALT 18 09/01/2023 0527   ALT 23 03/24/2023 1232   ALKPHOS 60 09/01/2023 0527   BILITOT 0.7 09/01/2023 0527   BILITOT 0.4 06/26/2023 1339   BILITOT 0.4 03/24/2023 1232   GFRNONAA >60 09/19/2023 0311   GFRNONAA >60 03/24/2023 1232   GFRNONAA 67 04/16/2016 0937   GFRAA 77  04/05/2019 0926   GFRAA 78 04/16/2016 0937   Lipase     Component Value Date/Time   LIPASE 23 02/11/2021 0416       Studies/Results: DG CHEST PORT 1 VIEW Result Date: 09/23/2023 CLINICAL DATA:  Right pneumothorax EXAM: PORTABLE CHEST 1 VIEW COMPARISON:  09/23/2023 at 5:44 a.m. FINDINGS: Interval removal of the right pleural drainage catheter. No current well-defined pneumothorax. Mild bibasilar airspace opacity similar to prior. Faint bandlike opacity in the right mid lung, probably from atelectasis or scarring. Atherosclerotic calcification of the aortic arch. Old healed left clavicular fracture. The patient has known right-sided rib fractures. IMPRESSION: 1. Interval removal of the right pleural drainage catheter. No visible pneumothorax. 2. Mild bibasilar airspace opacity similar to prior. 3. Faint bandlike opacity in the right mid lung, probably from atelectasis or scarring. 4. Aortic Atherosclerosis (ICD10-I70.0). Electronically Signed   By: Gaylyn Rong M.D.   On: 09/23/2023 20:24    Anti-infectives: Anti-infectives (From admission, onward)    Start     Dose/Rate Route Frequency Ordered Stop   09/21/23 1115  cefTRIAXone (ROCEPHIN) 2 g in sodium chloride 0.9 % 100 mL IVPB  Status:  Discontinued        2 g 200 mL/hr over 30 Minutes Intravenous Every 24 hours 09/21/23 1022 09/24/23 1055  09/18/23 0430  ceFEPIme (MAXIPIME) 2 g in sodium chloride 0.9 % 100 mL IVPB  Status:  Discontinued        2 g 200 mL/hr over 30 Minutes Intravenous Every 12 hours 09/18/23 0424 09/21/23 1022        Assessment/Plan Fall out of bed R rib fractures 4-9 - pulm toilet, IS, pain control, mobilization R PTX - pigtail placed in trauma bay. Airleak resolved 3/7. R CT removed 3/11 and follow up CXR was stable. She will need repeat cxr in 2 weeks which I discussed with her Acute on Chronic respiratory failure on home O2/COPD/ possible PNA - greatly appreciate CCM assistance with this patient.   DNR/DNI now. Nebs. Sulomedrol initially. Now on prednisone taper. Lasix. Mucinex. Resp cx w/ moderate serratia marcescens-  Cefepime now transitioned to CTX. Stop ABX today. Final reccs and follow up as per CCM note 3/12 - prednisone taper to 10mg /d then maintain. Trelegy 200 on dc. F/u on 3/17  CAD/MI - continue Brilinta DM - SSI HTN - home meds HLD Depression/Anxiety - home meds of klonopin bid, doxepin bid, lexapro qd Hypothyroidism - home meds Acute urinary retention - resolved Hypotension - improving.  Cont to monitor.  Home HTN meds have hold parameters  FEN - carb mod diet, inc bowel regimen VTE - ASA/Brilinta/Lovenox ID - Cefepime 3/5-3/8, rocephin 3/9>3/12  DISPO - 4E, cont pulmonary care.  Greatly appreciate all consultants. Medically stable for DC to SNF    LOS: 9 days   I reviewed Consultant pulmonology notes, last 24 h vitals and pain scores, last 48 h intake and output, last 24 h labs and trends, and last 24 h imaging results.   Eric Form, [facility] 09/25/2023, 7:33 AM Please see Amion for pager number during day hours 7:00am-4:30pm

## 2023-09-25 NOTE — Plan of Care (Signed)

## 2023-09-26 ENCOUNTER — Telehealth (HOSPITAL_COMMUNITY): Payer: Medicare HMO | Admitting: Physician Assistant

## 2023-09-26 ENCOUNTER — Encounter (HOSPITAL_COMMUNITY): Payer: Self-pay | Admitting: Physician Assistant

## 2023-09-26 DIAGNOSIS — F411 Generalized anxiety disorder: Secondary | ICD-10-CM

## 2023-09-26 DIAGNOSIS — F3342 Major depressive disorder, recurrent, in full remission: Secondary | ICD-10-CM

## 2023-09-26 DIAGNOSIS — Z8679 Personal history of other diseases of the circulatory system: Secondary | ICD-10-CM | POA: Diagnosis not present

## 2023-09-26 DIAGNOSIS — J189 Pneumonia, unspecified organism: Secondary | ICD-10-CM | POA: Diagnosis not present

## 2023-09-26 DIAGNOSIS — J939 Pneumothorax, unspecified: Secondary | ICD-10-CM | POA: Diagnosis not present

## 2023-09-26 DIAGNOSIS — J441 Chronic obstructive pulmonary disease with (acute) exacerbation: Secondary | ICD-10-CM | POA: Diagnosis not present

## 2023-09-26 DIAGNOSIS — J9611 Chronic respiratory failure with hypoxia: Secondary | ICD-10-CM | POA: Diagnosis not present

## 2023-09-26 DIAGNOSIS — J449 Chronic obstructive pulmonary disease, unspecified: Secondary | ICD-10-CM | POA: Diagnosis not present

## 2023-09-26 DIAGNOSIS — I10 Essential (primary) hypertension: Secondary | ICD-10-CM | POA: Diagnosis not present

## 2023-09-26 DIAGNOSIS — F17210 Nicotine dependence, cigarettes, uncomplicated: Secondary | ICD-10-CM | POA: Diagnosis not present

## 2023-09-26 MED ORDER — CLONAZEPAM 0.5 MG PO TABS
0.5000 mg | ORAL_TABLET | Freq: Three times a day (TID) | ORAL | 0 refills | Status: DC | PRN
  Filled 2023-09-26 – 2023-10-29 (×3): qty 90, 30d supply, fill #0

## 2023-09-26 MED ORDER — BUSPIRONE HCL 15 MG PO TABS
ORAL_TABLET | ORAL | 1 refills | Status: DC
  Filled 2023-09-26 – 2023-10-01 (×2): qty 120, 30d supply, fill #0
  Filled 2023-11-06: qty 120, 30d supply, fill #1

## 2023-09-26 MED ORDER — DOXEPIN HCL 75 MG PO CAPS
75.0000 mg | ORAL_CAPSULE | Freq: Every day | ORAL | 1 refills | Status: DC
  Filled 2023-09-26 – 2023-10-01 (×2): qty 30, 30d supply, fill #0
  Filled 2023-11-06: qty 30, 30d supply, fill #1

## 2023-09-26 MED ORDER — MIRTAZAPINE 45 MG PO TABS
45.0000 mg | ORAL_TABLET | Freq: Every day | ORAL | 1 refills | Status: DC
  Filled 2023-09-26 – 2023-10-01 (×2): qty 30, 30d supply, fill #0
  Filled 2023-11-06: qty 30, 30d supply, fill #1

## 2023-09-26 MED ORDER — ESCITALOPRAM OXALATE 20 MG PO TABS
20.0000 mg | ORAL_TABLET | Freq: Every day | ORAL | 1 refills | Status: DC
Start: 2023-09-26 — End: 2023-11-17
  Filled 2023-09-26 – 2023-10-01 (×2): qty 30, 30d supply, fill #0
  Filled 2023-11-06: qty 30, 30d supply, fill #1

## 2023-09-26 NOTE — Progress Notes (Signed)
 [facility] MD/PA/NP OP Progress Note  Virtual Visit via Video Note  I connected with [name] [name] on 09/26/23 at  4:30 PM EDT by a video enabled telemedicine application and verified that I am speaking with the correct person using two identifiers.  Location: Patient: Home Provider: Clinic   I discussed the limitations of evaluation and management by telemedicine and the availability of in person appointments. The patient expressed understanding and agreed to proceed.  Follow Up Instructions:   I discussed the assessment and treatment plan with the patient. The patient was provided an opportunity to ask questions and all were answered. The patient agreed with the plan and demonstrated an understanding of the instructions.   The patient was advised to call back or seek an in-person evaluation if the symptoms worsen or if the condition fails to improve as anticipated.  I provided 8 minutes of non-face-to-face time during this encounter.  Meta Hatchet, PA   09/26/2023 9:55 PM [name] [name]  MRN:  [id number]  Chief Complaint:  Chief Complaint  Patient presents with   Follow-up   Medication Management   HPI:   [name] [name] is a 68 year old female with a past psychiatric history significant for insomnia, generalized anxiety disorder, and major depressive disorder who presents to [facility] via virtual video visit for follow-up and medication management.  Patient is currently being managed on the following medications:  Doxepin (Sinequan) 50 mg at bedtime Buspirone 15 mg 3 times daily in the morning/1 tablet at noon/1 tablet in the evening  Escitalopram 30 mg daily Clonazepam 0.5 mg 2 times daily as needed Mirtazapine 30 mg at bedtime  Patient presents to the encounter stating that she recently had surgery on her arm due to breaking her wrist after a fall.  Patient denies overt depressive symptoms but states that her anxiety has worsened  recently since attempting to quit smoking.  Patient rates her anxiety a 10 out of 10.  Patient is currently in a nursing home due to her injuries and has not started rehab yet.  A GAD-7 screen was performed with the patient scoring a 14.  Patient is alert and oriented x 4, pleasant, calm, cooperative, and fully engaged in conversation during the encounter.  Patient endorses low mood due to not being able to do anything while in the nursing home.  Patient denies suicidal or homicidal ideations.  She further denies auditory or visual hallucinations and does not appear to be responding to internal/external stimuli.  Patient endorses good sleep and receives on average 8 to 9 hours of sleep per night.  Patient endorses good appetite and eats on average 3 meals per day.  Patient denies alcohol consumption or illicit drug use.  Patient denies tobacco use stating that she last smoked 2 days ago.  Visit Diagnosis:    ICD-10-CM   1. Major depressive disorder, recurrent, in full remission with anxious distress (HCC)  F33.42 escitalopram (LEXAPRO) 20 MG tablet    busPIRone (BUSPAR) 15 MG tablet    mirtazapine (REMERON) 45 MG tablet    doxepin (SINEQUAN) 75 MG capsule    2. GAD (generalized anxiety disorder)  F41.1 escitalopram (LEXAPRO) 20 MG tablet    busPIRone (BUSPAR) 15 MG tablet    mirtazapine (REMERON) 45 MG tablet    clonazePAM (KLONOPIN) 0.5 MG tablet      Past Psychiatric History:  Insomnia Major depressive disorder Generalized anxiety disorder  Past Medical History:  Past Medical History:  Diagnosis Date   Allergy    seasonal   Anemia    Anxiety    takes Lexapro daily   Arthritis    "back, from neck down pass my bra area" (03/25/2017)   Asthma    Bartholin gland cyst 08/29/2011   Bruises easily    pt is on Effient   Chronic back pain    herniated nucleus pulposus   Chronic back pain    "neck to bra area; lower back" (03/25/2017)   Chronic kidney disease    recurrent UTI's this  year 2022   COPD (chronic obstructive pulmonary disease) (HCC)    early stages   Coronary artery disease    Depression    takes Klonopin daily   Diabetes mellitus without complication (HCC)    Diverticulosis    Fibroadenoma of left breast    GERD (gastroesophageal reflux disease)    takes Nexium daily   H/O hiatal hernia    Heart attack (HCC) 2011   Hemorrhoids    Hernia    Hyperlipidemia    takes Lipitor daily   Hypertension    takes Losartan daily and Labetalol bid   Hypothyroidism    takes Synthroid daily   Insomnia    hydroxyzine prn   Joint pain    Pneumonia    "couple times" (03/25/2017)   Pre-diabetes    "just found out 1 wk ago" (03/25/2017)   Psoriasis    elbows,knees,back   Shortness of breath    with exertion   Slowing of urinary stream    Stress incontinence     Past Surgical History:  Procedure Laterality Date   ABDOMINAL EXPLORATION SURGERY  1977   ABDOMINAL HYSTERECTOMY  1977   "left one of my ovaries"   BACK SURGERY     BIOPSY  05/27/2019   Procedure: BIOPSY;  Surgeon: Corbin Ade, MD;  Location: [facility];  Service: Endoscopy;;   BREAST BIOPSY Left 11/2021   x2   COLONOSCOPY  2011   Dr. Christella Hartigan: Mild diverticulosis, descending diminutive colon polyp (not retrieved), next colonoscopy 10 years   CORONARY ANGIOPLASTY WITH STENT PLACEMENT  2011 X2   "regular stents didn't work; had to go back in in ~ 1 month and put in medicated stents"   DILATION AND CURETTAGE OF UTERUS     ESOPHAGOGASTRODUODENOSCOPY     ESOPHAGOGASTRODUODENOSCOPY (EGD) WITH PROPOFOL N/A 05/27/2019   normal esophagus, dilation, erosive gastropathy s/p biopsy, normal duodenum. Negative H.pylori.    LEFT HEART CATH AND CORONARY ANGIOGRAPHY N/A 03/26/2017   Procedure: LEFT HEART CATH AND CORONARY ANGIOGRAPHY;  Surgeon: Lyn Records, MD;  Location: [facility] INVASIVE CV LAB;  Service: Cardiovascular;  Laterality: N/A;   LUMBAR LAMINECTOMY/DECOMPRESSION MICRODISCECTOMY  08/05/2011    Procedure: LUMBAR LAMINECTOMY/DECOMPRESSION MICRODISCECTOMY;  Surgeon: Clydene Fake, MD;  Location: [facility] NEURO ORS;  Service: Neurosurgery;  Laterality: Right;  Right Lumbar four-five extraforaminal discectomy   MALONEY DILATION N/A 05/27/2019   Procedure: Elease Hashimoto DILATION;  Surgeon: Corbin Ade, MD;  Location: [facility];  Service: Endoscopy;  Laterality: N/A;  54   SHOULDER ARTHROSCOPY WITH ROTATOR CUFF REPAIR AND OPEN BICEPS TENODESIS Right 12/24/2021   Procedure: RIGHT SHOULDER ARTHROSCOPY WITH ROTATOR CUFF REPAIR AND BICEPS TENODESIS;  Surgeon: Eldred Manges, MD;  Location: [facility] OR;  Service: Orthopedics;  Laterality: Right;   TONSILLECTOMY     as a child   UPPER GASTROINTESTINAL ENDOSCOPY      Family Psychiatric History:  See intake H&P  for full details. Reviewed, with no updates at this time.  Family History:  Family History  Problem Relation Age of Onset   Heart disease Mother    Hypertension Mother    Stroke Mother    Mental illness Mother    Heart disease Father    Hypertension Father    Diabetes Father    Colon polyps Brother    Heart disease Maternal Grandfather    Cancer Paternal Grandmother        mouth   Anesthesia problems Daughter    Breast cancer Maternal Aunt    Throat cancer Maternal Uncle    Thyroid disease Paternal Aunt    Hypotension Neg Hx    Malignant hyperthermia Neg Hx    Pseudochol deficiency Neg Hx    Colon cancer Neg Hx    Stomach cancer Neg Hx    Esophageal cancer Neg Hx    Pancreatic cancer Neg Hx    Rectal cancer Neg Hx     Social History:  Social History   Socioeconomic History   Marital status: Divorced    Spouse name: Not on file   Number of children: 1   Years of education: Not on file   Highest education level: 9th grade  Occupational History   Not on file  Tobacco Use   Smoking status: Every Day    Current packs/day: 0.50    Average packs/day: 0.5 packs/day for 50.0 years (25.0 ttl pk-yrs)    Types: Cigarettes    Smokeless tobacco: Never  Vaping Use   Vaping status: Some Days   Substances: Nicotine  Substance and Sexual Activity   Alcohol use: Yes    Comment: once a week   Drug use: Yes    Types: Marijuana    Comment: history of cocaine use, been about a year, per patient (12/18/21)   Sexual activity: Not Currently    Birth control/protection: Surgical  Other Topics Concern   Not on file  Social History Narrative   Not on file   Social Drivers of Health   Financial Resource Strain: Medium Risk (08/07/2023)   Overall Financial Resource Strain (CARDIA)    Difficulty of Paying Living Expenses: Somewhat hard  Food Insecurity: No Food Insecurity (09/16/2023)   Hunger Vital Sign    Worried About Running Out of Food in the Last Year: Never true    Ran Out of Food in the Last Year: Never true  Recent Concern: Food Insecurity - Food Insecurity Present (08/07/2023)   Hunger Vital Sign    Worried About Running Out of Food in the Last Year: Sometimes true    Ran Out of Food in the Last Year: Sometimes true  Transportation Needs: No Transportation Needs (09/16/2023)   PRAPARE - Administrator, Civil Service (Medical): No    Lack of Transportation (Non-Medical): No  Physical Activity: Inactive (08/07/2023)   Exercise Vital Sign    Days of Exercise per Week: 0 days    Minutes of Exercise per Session: 0 min  Stress: No Stress Concern Present (08/07/2023)   [employer] of Occupational Health - Occupational Stress Questionnaire    Feeling of Stress : Only a little  Social Connections: Moderately Isolated (09/16/2023)   Social Connection and Isolation Panel [NHANES]    Frequency of Communication with Friends and Family: More than three times a week    Frequency of Social Gatherings with Friends and Family: Twice a week    Attends Religious Services: 1 to 4 times  per year    Active Member of Clubs or Organizations: No    Attends Banker Meetings: Never    Marital Status: Divorced     Allergies:  Allergies  Allergen Reactions   Avocado Anaphylaxis   Latex Shortness Of Breath and Rash   Codeine Nausea Only    Metabolic Disorder Labs: Lab Results  Component Value Date   HGBA1C 6.2 (H) 09/01/2023   MPG 131.24 09/01/2023   MPG 105.41 12/18/2021   No results found for: "PROLACTIN" Lab Results  Component Value Date   CHOL 192 06/26/2023   TRIG 168 (H) 06/26/2023   HDL 71 06/26/2023   CHOLHDL 2.7 06/26/2023   VLDL 15 04/16/2016   LDLCALC 93 06/26/2023   LDLCALC 136 (H) 03/21/2022   Lab Results  Component Value Date   TSH 1.790 05/20/2023   TSH 3.180 09/10/2022    Therapeutic Level Labs: No results found for: "LITHIUM" No results found for: "VALPROATE" No results found for: "CBMZ"  Current Medications: Current Outpatient Medications  Medication Sig Dispense Refill   clonazePAM (KLONOPIN) 0.5 MG tablet Take 1 tablet (0.5 mg total) by mouth 3 (three) times daily as needed for anxiety. 90 tablet 0   acetaminophen (TYLENOL) 500 MG tablet Take 2 tablets (1,000 mg total) by mouth every 6 (six) hours.     alendronate (FOSAMAX) 35 MG tablet Take 1 tablet (35 mg total) by mouth every 7 (seven) days. Take with a full glass of water on an empty stomach. Sit upright for 30-60 mins after taking 12 tablet 1   amLODipine (NORVASC) 5 MG tablet Take 1 tablet (5 mg total) by mouth daily. 90 tablet 3   aspirin EC 81 MG tablet Take 81 mg by mouth in the morning.     atorvastatin (LIPITOR) 80 MG tablet Take 1 tablet (80 mg total) by mouth daily. 90 tablet 3   Blood Glucose Monitoring Suppl (ACCU-CHEK GUIDE) w/Device KIT Use to check blood sugars once a day 1 kit 0   busPIRone (BUSPAR) 15 MG tablet Take 2 tablets (30 mg total) by mouth in the morning AND 1 tablet (15 mg total) daily at 12 noon AND 1 tablet (15 mg total) every evening. 120 tablet 1   docusate sodium (COLACE) 100 MG capsule Take 1 capsule (100 mg total) by mouth 2 (two) times daily as needed for mild  constipation.     doxepin (SINEQUAN) 75 MG capsule Take 1 capsule (75 mg total) by mouth at bedtime. 30 capsule 1   escitalopram (LEXAPRO) 20 MG tablet Take 1 tablet (20 mg total) by mouth daily. 30 tablet 1   ezetimibe (ZETIA) 10 MG tablet Take 1 tablet (10 mg total) by mouth daily. (Patient taking differently: Take 10 mg by mouth at bedtime.) 90 tablet 3   Fluticasone-Umeclidin-Vilant (TRELEGY ELLIPTA) 200-62.5-25 MCG/ACT AEPB Inhale 1 puff into the lungs daily. 60 each 11   furosemide (LASIX) 20 MG tablet Take 1 tablet (20 mg total) by mouth daily. 90 tablet 3   gabapentin (NEURONTIN) 600 MG tablet TAKE 1 TABLET BY MOUTH 4 TIMES DAILY (Patient taking differently: Take 600 mg by mouth 2 (two) times daily.) 360 tablet 2   glucose blood (TRUE METRIX BLOOD GLUCOSE TEST) test strip Use as instructed to check blood sugars once a day 100 each 12   guaiFENesin (MUCINEX) 600 MG 12 hr tablet Take 1 tablet (600 mg total) by mouth 2 (two) times daily for 5 days.     ipratropium-albuterol (  DUONEB) 0.5-2.5 (3) MG/3ML SOLN Take 3 mLs by nebulization every 6 (six) hours as needed. 360 mL 0   levothyroxine (SYNTHROID) 112 MCG tablet Take 1 tablet (112 mcg total) by mouth daily. 90 tablet 2   lidocaine (LIDODERM) 5 % Place 1 patch onto the skin daily. Remove & Discard patch within 12 hours or as directed by MD     losartan (COZAAR) 50 MG tablet Take 1.5 tablets (75 mg total) by mouth daily. 135 tablet 3   metFORMIN (GLUCOPHAGE) 500 MG tablet Take 1 tablet (500 mg total) by mouth 2 (two) times daily with a meal. (Patient taking differently: Take 1 tablet (500 mg total) by mouth 2 (two) times daily with a meal.) 180 tablet 1   mirtazapine (REMERON) 45 MG tablet Take 1 tablet (45 mg total) by mouth at bedtime. 30 tablet 1   montelukast (SINGULAIR) 10 MG tablet Take 1 tablet (10 mg total) by mouth at bedtime. 90 tablet 2   nebivolol (BYSTOLIC) 10 MG tablet Take 1 tablet (10 mg total) by mouth daily. 90 tablet 3    nicotine (NICODERM CQ - DOSED IN MG/24 HOURS) 21 mg/24hr patch Place 1 patch (21 mg total) onto the skin daily. 28 patch 0   nitroGLYCERIN (NITROSTAT) 0.4 MG SL tablet Place 1 tablet (0.4 mg total) under the tongue every 5 (five) minutes as needed for chest pain. 25 tablet 3   omeprazole (PRILOSEC) 40 MG capsule Take 1 capsule (40 mg total) by mouth 2 (two) times daily before a meal. 60 capsule 1   oxyCODONE (OXY IR/ROXICODONE) 5 MG immediate release tablet Take 1-2 tablets (5-10 mg total) by mouth every 6 (six) hours as needed for up to 5 days for severe pain (pain score 7-10) or moderate pain (pain score 4-6) (5 mg for moderate pain, 10 mg for severe pain). 20 tablet 0   polyethylene glycol (MIRALAX / GLYCOLAX) 17 g packet Take 17 g by mouth daily as needed for severe constipation.     predniSONE (DELTASONE) 10 MG tablet Take 2 tablets (20 mg total) by mouth daily with breakfast for 1 day, THEN 1 tablet (10 mg total) daily with breakfast for 14 days.     Spacer/Aero-Holding Rudean Curt Use with the albuterol inhaler. 1 each 0   ticagrelor (BRILINTA) 60 MG TABS tablet Take 1 tablet (60 mg total) by mouth 2 (two) times daily. 60 tablet 52   TRUEplus Lancets 28G MISC Use to check blood sugars once a day 100 each 2   VENTOLIN HFA 108 (90 Base) MCG/ACT inhaler INHALE 2 PUFFS into THE lungs EVERY 6 HOURS AS NEEDED FOR wheezing OR SHORTNESS OF BREATH 18 g 10   Zoster Vaccine Adjuvanted [facility]) injection Inject 0.5 mLs into the muscle once for 1 dose. 0.5 mL 0   No current facility-administered medications for this visit.     Musculoskeletal: Strength & Muscle Tone: Unable to assess due to telemedicine visit Gait & Station: Unable to assess due to telemedicine visit Patient leans: Unable to assess due to telemedicine visit  Psychiatric Specialty Exam: Review of Systems  Psychiatric/Behavioral:  Negative for dysphoric mood, hallucinations, self-injury, sleep disturbance and suicidal ideas. The  patient is nervous/anxious. The patient is not hyperactive.     There were no vitals taken for this visit.There is no height or weight on file to calculate BMI.  General Appearance: Casual  Eye Contact:  Good  Speech:  Clear and Coherent and Normal Rate  Volume:  Normal  Mood:  Anxious  Affect:  Appropriate  Thought Process:  Coherent, Goal Directed, and Descriptions of Associations: Intact  Orientation:  Full (Time, Place, and Person)  Thought Content: WDL   Suicidal Thoughts:  No  Homicidal Thoughts:  No  Memory:  Immediate;   Good Recent;   Good Remote;   Good  Judgement:  Fair  Insight:  Fair  Psychomotor Activity:  Normal  Concentration:  Concentration: Good and Attention Span: Good  Recall:  [employer] of Knowledge: Fair  Language: Fair  Akathisia:  Negative  Handed:  Right  AIMS (if indicated): not done  Assets:  Communication Skills Desire for Improvement Housing  ADL's:  Intact  Cognition: WNL  Sleep:  Good   Screenings: AUDIT    Flowsheet Row [facility]  Alcohol Use Disorder Identification Test Final Score (AUDIT) 12      CAGE-AID    Flowsheet Row ED to Hosp-Admission (Discharged) from 09/16/2023 in [facility] 4E CV SURGICAL PROGRESSIVE CARE  CAGE-AID Score 0      GAD-7    Flowsheet Row Video Visit from 09/26/2023 in [facility] Office Visit from 08/07/2023 in [facility] [place] Video Visit from 06/27/2023 in [facility] Video Visit from 04/25/2023 in [facility] Video Visit from 03/14/2023 in [facility]  Total GAD-7 Score 14 7 3  0 9      PHQ2-9    Flowsheet Row Video Visit from 09/26/2023 in [facility] Office Visit from 08/07/2023 in [facility] [place]  [facility] Video Visit from 06/27/2023 in [facility] Video Visit from 04/25/2023 in [facility] Video Visit from 03/14/2023 in [facility]  PHQ-2 Total Score 0 3 0 2 2  PHQ-9 Total Score -- 7 -- 5 8      Flowsheet Row Video Visit from 09/26/2023 in [facility] ED to Hosp-Admission (Discharged) from 09/16/2023 in [facility] 4E CV SURGICAL PROGRESSIVE CARE ED to Hosp-Admission (Discharged) from 08/31/2023 in [facility] 6 NORTH  SURGICAL  C-SSRS RISK CATEGORY No Risk No Risk No Risk        Assessment and Plan:   [name] [name] is a 68 year old female with a past psychiatric history significant for insomnia, generalized anxiety disorder, and major depressive disorder who presents to [facility] via virtual video visit for follow-up and medication management.  Patient presents today encounter stating that she is currently recovering from the nursing home due to recently falling and injuring her wrist.  Patient denies overt depressive symptoms but does endorse anxiety attributed to trying to quit smoking.  Patient is requesting management of her anxiety while attempting to quit smoking.  Provider to adjust patient's clonazepam dosage from 0.5 mg 2 times daily as needed to 3 times daily as needed.  Patient was agreeable to recommendation.  Medications to be prescribed to pharmacy of choice.  Labs to be obtained from the patient during her next encounter.  Collaboration of Care: Collaboration of Care: Medication Management AEB patient's medications being managed by this provider and Psychiatrist AEB patient being managed by this behavioral health provider  Patient/Guardian was advised Release of Information must be obtained prior to any record  release in order to collaborate their care with an outside provider. Patient/Guardian  was advised if they have not already done so to contact the registration department to sign all necessary forms in order for [place] to release information regarding their care.   Consent: Patient/Guardian gives verbal consent for treatment and assignment of benefits for services provided during this visit. Patient/Guardian expressed understanding and agreed to proceed.   1. Major depressive disorder, recurrent, in full remission with anxious distress (HCC)  - escitalopram (LEXAPRO) 20 MG tablet; Take 1 tablet (20 mg total) by mouth daily.  Dispense: 30 tablet; Refill: 1 - busPIRone (BUSPAR) 15 MG tablet; Take 2 tablets (30 mg total) by mouth in the morning AND 1 tablet (15 mg total) daily at 12 noon AND 1 tablet (15 mg total) every evening.  Dispense: 120 tablet; Refill: 1 - mirtazapine (REMERON) 45 MG tablet; Take 1 tablet (45 mg total) by mouth at bedtime.  Dispense: 30 tablet; Refill: 1 - doxepin (SINEQUAN) 75 MG capsule; Take 1 capsule (75 mg total) by mouth at bedtime.  Dispense: 30 capsule; Refill: 1  2. GAD (generalized anxiety disorder)  - escitalopram (LEXAPRO) 20 MG tablet; Take 1 tablet (20 mg total) by mouth daily.  Dispense: 30 tablet; Refill: 1 - busPIRone (BUSPAR) 15 MG tablet; Take 2 tablets (30 mg total) by mouth in the morning AND 1 tablet (15 mg total) daily at 12 noon AND 1 tablet (15 mg total) every evening.  Dispense: 120 tablet; Refill: 1 - mirtazapine (REMERON) 45 MG tablet; Take 1 tablet (45 mg total) by mouth at bedtime.  Dispense: 30 tablet; Refill: 1 - clonazePAM (KLONOPIN) 0.5 MG tablet; Take 1 tablet (0.5 mg total) by mouth 3 (three) times daily as needed for anxiety.  Dispense: 90 tablet; Refill: 0  Patient to follow up in 6 weeks Provider spent a total of 20 minutes with the patient/reviewing patient's chart  Meta Hatchet, PA 09/26/2023, 9:55 PM

## 2023-09-27 ENCOUNTER — Other Ambulatory Visit (HOSPITAL_COMMUNITY): Payer: Self-pay

## 2023-09-27 DIAGNOSIS — F411 Generalized anxiety disorder: Secondary | ICD-10-CM | POA: Diagnosis not present

## 2023-09-27 DIAGNOSIS — J441 Chronic obstructive pulmonary disease with (acute) exacerbation: Secondary | ICD-10-CM | POA: Diagnosis not present

## 2023-09-27 DIAGNOSIS — J9611 Chronic respiratory failure with hypoxia: Secondary | ICD-10-CM | POA: Diagnosis not present

## 2023-09-27 DIAGNOSIS — Z8679 Personal history of other diseases of the circulatory system: Secondary | ICD-10-CM | POA: Diagnosis not present

## 2023-09-27 DIAGNOSIS — J189 Pneumonia, unspecified organism: Secondary | ICD-10-CM | POA: Diagnosis not present

## 2023-09-27 DIAGNOSIS — J449 Chronic obstructive pulmonary disease, unspecified: Secondary | ICD-10-CM | POA: Diagnosis not present

## 2023-09-27 DIAGNOSIS — I10 Essential (primary) hypertension: Secondary | ICD-10-CM | POA: Diagnosis not present

## 2023-09-27 DIAGNOSIS — J939 Pneumothorax, unspecified: Secondary | ICD-10-CM | POA: Diagnosis not present

## 2023-09-27 DIAGNOSIS — F17210 Nicotine dependence, cigarettes, uncomplicated: Secondary | ICD-10-CM | POA: Diagnosis not present

## 2023-09-29 ENCOUNTER — Ambulatory Visit

## 2023-09-29 ENCOUNTER — Encounter: Payer: Self-pay | Admitting: Pulmonary Disease

## 2023-09-29 ENCOUNTER — Ambulatory Visit: Payer: Medicare HMO | Admitting: Pulmonary Disease

## 2023-09-29 ENCOUNTER — Other Ambulatory Visit: Payer: Self-pay

## 2023-09-29 ENCOUNTER — Other Ambulatory Visit: Payer: Self-pay | Admitting: Internal Medicine

## 2023-09-29 VITALS — BP 115/58 | HR 68 | Ht <= 58 in | Wt 162.6 lb

## 2023-09-29 DIAGNOSIS — I700 Atherosclerosis of aorta: Secondary | ICD-10-CM | POA: Diagnosis not present

## 2023-09-29 DIAGNOSIS — J9611 Chronic respiratory failure with hypoxia: Secondary | ICD-10-CM | POA: Diagnosis not present

## 2023-09-29 DIAGNOSIS — J939 Pneumothorax, unspecified: Secondary | ICD-10-CM

## 2023-09-29 DIAGNOSIS — I10 Essential (primary) hypertension: Secondary | ICD-10-CM | POA: Diagnosis not present

## 2023-09-29 DIAGNOSIS — Z8679 Personal history of other diseases of the circulatory system: Secondary | ICD-10-CM | POA: Diagnosis not present

## 2023-09-29 DIAGNOSIS — J189 Pneumonia, unspecified organism: Secondary | ICD-10-CM | POA: Diagnosis not present

## 2023-09-29 DIAGNOSIS — F17210 Nicotine dependence, cigarettes, uncomplicated: Secondary | ICD-10-CM | POA: Diagnosis not present

## 2023-09-29 DIAGNOSIS — S2241XD Multiple fractures of ribs, right side, subsequent encounter for fracture with routine healing: Secondary | ICD-10-CM | POA: Diagnosis not present

## 2023-09-29 DIAGNOSIS — J441 Chronic obstructive pulmonary disease with (acute) exacerbation: Secondary | ICD-10-CM | POA: Diagnosis not present

## 2023-09-29 DIAGNOSIS — J449 Chronic obstructive pulmonary disease, unspecified: Secondary | ICD-10-CM | POA: Diagnosis not present

## 2023-09-29 DIAGNOSIS — F411 Generalized anxiety disorder: Secondary | ICD-10-CM | POA: Diagnosis not present

## 2023-09-29 DIAGNOSIS — J90 Pleural effusion, not elsewhere classified: Secondary | ICD-10-CM | POA: Diagnosis not present

## 2023-09-29 DIAGNOSIS — J4489 Other specified chronic obstructive pulmonary disease: Secondary | ICD-10-CM

## 2023-09-29 NOTE — Patient Instructions (Addendum)
 We will check a chest x-ray to follow up on the pneumothorax from the hospital  Continue trelegy ellipta 1 puff daily - rinse mouth out after each use  Use duoneb treatments every 8 hours  Use albuterol inhaler 1-2 puffs every 4-6 hours as needed  Continue prednisone 10mg  daily  Follow up in 2-3 months with Tammy Parrett or Dr. Francine Graven

## 2023-09-29 NOTE — Progress Notes (Signed)
 Synopsis: Referred in January 2025 for hospital follow up for COPD exacerbation  Subjective:   PATIENT ID: [name] [name] GENDER: female DOB: 1955/10/25, MRN: [id number]  HPI  Chief Complaint  Patient presents with   Follow-up   [name] [name] is a 68 year old woman, daily smoker with history of COPD, DMII, GERD, and CAD who is referred to pulmonary clinic for hospital follow up of COPD exacerbation.  OV 08/07/23 She was admitted 07/12/23 to 07/15/23 and 07/21/2023 to1/02/2024 for COPD exacerbations. She was seen by the inpatient pulmonary team during the December hospitalization. She was parainfluenza positive 06/14/23 and rhinovirus positive 07/23/23.   She  presents with increased mucus production described as "thick and yucky." The patient reports difficulty in expectorating the mucus, which is yellow in color. The patient also mentions an increase in wheezing and the need to increase her oxygen supplementation from 2 to 3 liters when moving around, as her oxygen saturation drops to the low 70s. The patient's caregiver confirms this, noting that the patient's oxygen saturation improves to above 88% with the increased oxygen flow.  The patient continues to smoke about a pack of cigarettes per day, despite previous attempts to quit using Chantix and nicotine patches. The patient acknowledges the need to quit but expresses significant anxiety about the prospect, describing a strong craving for cigarettes and a sense of loss without them. The patient also mentions a history of mood disorders and is currently on clonazepam and an unspecified mood medication.  The patient is currently on Trelegy for COPD management.. The patient also uses Ventolin and a nebulizer with DuoNeb, though the frequency of use is inconsistent. The patient's caregiver is working on setting up reminders for the patient to use the nebulizer at least twice a day.  The patient also mentions a recent fall, but no further details or  related symptoms are provided. The patient lives alone in a condo and spends most of the day watching TV, with smoking breaks providing most of her physical activity. The patient's caregiver is involved and supportive, helping with medication reminders and attending medical appointments.  The patient has a history of lung cancer and is due for a follow-up scan, which was last done about six months ago. The patient also mentions a history of prednisone use, but is not currently on it. The patient expresses concern about her memory, which she feels is worsening.  OV 09/29/23 She was admitted 3/5 to 3/13 for fall and pneumothorax. She was admitted to surgical service and chest tube was placed. Pneumothorax has resolved.   She continues to have cough, sputum production and dyspnea.   She has reduced her smoking to one cigarette a day, with her daughter limiting her access to cigarettes. She is trying to reduce her smoking further. She is currently on oxygen therapy and questions the necessity of wearing it continuously, as she feels she can breathe without it.  She is residing in a rehab facility and undergoing physical therapy.   Past Medical History:  Diagnosis Date   Allergy    seasonal   Anemia    Anxiety    takes Lexapro daily   Arthritis    "back, from neck down pass my bra area" (03/25/2017)   Asthma    Bartholin gland cyst 08/29/2011   Bruises easily    pt is on Effient   Chronic back pain    herniated nucleus pulposus   Chronic back pain    "neck to bra  area; lower back" (03/25/2017)   Chronic kidney disease    recurrent UTI's this year 2022   COPD (chronic obstructive pulmonary disease) (HCC)    early stages   Coronary artery disease    Depression    takes Klonopin daily   Diabetes mellitus without complication (HCC)    Diverticulosis    Fibroadenoma of left breast    GERD (gastroesophageal reflux disease)    takes Nexium daily   H/O hiatal hernia    Heart attack (HCC)  2011   Hemorrhoids    Hernia    Hyperlipidemia    takes Lipitor daily   Hypertension    takes Losartan daily and Labetalol bid   Hypothyroidism    takes Synthroid daily   Insomnia    hydroxyzine prn   Joint pain    Pneumonia    "couple times" (03/25/2017)   Pre-diabetes    "just found out 1 wk ago" (03/25/2017)   Psoriasis    elbows,knees,back   Shortness of breath    with exertion   Slowing of urinary stream    Stress incontinence      Family History  Problem Relation Age of Onset   Heart disease Mother    Hypertension Mother    Stroke Mother    Mental illness Mother    Heart disease Father    Hypertension Father    Diabetes Father    Colon polyps Brother    Heart disease Maternal Grandfather    Cancer Paternal Grandmother        mouth   Anesthesia problems Daughter    Breast cancer Maternal Aunt    Throat cancer Maternal Uncle    Thyroid disease Paternal Aunt    Hypotension Neg Hx    Malignant hyperthermia Neg Hx    Pseudochol deficiency Neg Hx    Colon cancer Neg Hx    Stomach cancer Neg Hx    Esophageal cancer Neg Hx    Pancreatic cancer Neg Hx    Rectal cancer Neg Hx      Social History   Socioeconomic History   Marital status: Divorced    Spouse name: Not on file   Number of children: 1   Years of education: Not on file   Highest education level: 9th grade  Occupational History   Not on file  Tobacco Use   Smoking status: Every Day    Current packs/day: 0.50    Average packs/day: 0.5 packs/day for 50.0 years (25.0 ttl pk-yrs)    Types: Cigarettes   Smokeless tobacco: Never  Vaping Use   Vaping status: Some Days   Substances: Nicotine  Substance and Sexual Activity   Alcohol use: Yes    Comment: once a week   Drug use: Yes    Types: Marijuana    Comment: history of cocaine use, been about a year, per patient (12/18/21)   Sexual activity: Not Currently    Birth control/protection: Surgical  Other Topics Concern   Not on file  Social  History Narrative   Not on file   Social Drivers of Health   Financial Resource Strain: Medium Risk (08/07/2023)   Overall Financial Resource Strain (CARDIA)    Difficulty of Paying Living Expenses: Somewhat hard  Food Insecurity: No Food Insecurity (09/16/2023)   Hunger Vital Sign    Worried About Running Out of Food in the Last Year: Never true    Ran Out of Food in the Last Year: Never true  Recent Concern: Food  Insecurity - Food Insecurity Present (08/07/2023)   Hunger Vital Sign    Worried About Running Out of Food in the Last Year: Sometimes true    Ran Out of Food in the Last Year: Sometimes true  Transportation Needs: No Transportation Needs (09/16/2023)   PRAPARE - Administrator, Civil Service (Medical): No    Lack of Transportation (Non-Medical): No  Physical Activity: Inactive (08/07/2023)   Exercise Vital Sign    Days of Exercise per Week: 0 days    Minutes of Exercise per Session: 0 min  Stress: No Stress Concern Present (08/07/2023)   [employer] of Occupational Health - Occupational Stress Questionnaire    Feeling of Stress : Only a little  Social Connections: Moderately Isolated (09/16/2023)   Social Connection and Isolation Panel [NHANES]    Frequency of Communication with Friends and Family: More than three times a week    Frequency of Social Gatherings with Friends and Family: Twice a week    Attends Religious Services: 1 to 4 times per year    Active Member of [employer] or Organizations: No    Attends Banker Meetings: Never    Marital Status: Divorced  Catering manager Violence: Not At Risk (09/16/2023)   Humiliation, Afraid, Rape, and Kick questionnaire    Fear of Current or Ex-Partner: No    Emotionally Abused: No    Physically Abused: No    Sexually Abused: No     Allergies  Allergen Reactions   Avocado Anaphylaxis   Latex Shortness Of Breath and Rash   Codeine Nausea Only     Outpatient Medications Prior to Visit  Medication  Sig Dispense Refill   acetaminophen (TYLENOL) 500 MG tablet Take 2 tablets (1,000 mg total) by mouth every 6 (six) hours.     alendronate (FOSAMAX) 35 MG tablet Take 1 tablet (35 mg total) by mouth every 7 (seven) days. Take with a full glass of water on an empty stomach. Sit upright for 30-60 mins after taking 12 tablet 1   amLODipine (NORVASC) 5 MG tablet Take 1 tablet (5 mg total) by mouth daily. 90 tablet 3   aspirin EC 81 MG tablet Take 81 mg by mouth in the morning.     atorvastatin (LIPITOR) 80 MG tablet Take 1 tablet (80 mg total) by mouth daily. 90 tablet 3   Blood Glucose Monitoring Suppl (ACCU-CHEK GUIDE) w/Device KIT Use to check blood sugars once a day 1 kit 0   busPIRone (BUSPAR) 15 MG tablet Take 2 tablets (30 mg total) by mouth in the morning AND 1 tablet (15 mg total) daily at 12 noon AND 1 tablet (15 mg total) every evening. 120 tablet 1   clonazePAM (KLONOPIN) 0.5 MG tablet Take 1 tablet (0.5 mg total) by mouth 3 (three) times daily as needed for anxiety. 90 tablet 0   docusate sodium (COLACE) 100 MG capsule Take 1 capsule (100 mg total) by mouth 2 (two) times daily as needed for mild constipation.     doxepin (SINEQUAN) 75 MG capsule Take 1 capsule (75 mg total) by mouth at bedtime. 30 capsule 1   escitalopram (LEXAPRO) 20 MG tablet Take 1 tablet (20 mg total) by mouth daily. 30 tablet 1   ezetimibe (ZETIA) 10 MG tablet Take 1 tablet (10 mg total) by mouth daily. (Patient taking differently: Take 10 mg by mouth at bedtime.) 90 tablet 3   Fluticasone-Umeclidin-Vilant (TRELEGY ELLIPTA) 200-62.5-25 MCG/ACT AEPB Inhale 1 puff into the lungs  daily. 60 each 11   furosemide (LASIX) 20 MG tablet Take 1 tablet (20 mg total) by mouth daily. 90 tablet 3   gabapentin (NEURONTIN) 600 MG tablet TAKE 1 TABLET BY MOUTH 4 TIMES DAILY (Patient taking differently: Take 600 mg by mouth 2 (two) times daily.) 360 tablet 2   glucose blood (TRUE METRIX BLOOD GLUCOSE TEST) test strip Use as instructed to  check blood sugars once a day 100 each 12   guaiFENesin (MUCINEX) 600 MG 12 hr tablet Take 1 tablet (600 mg total) by mouth 2 (two) times daily for 5 days.     ipratropium-albuterol (DUONEB) 0.5-2.5 (3) MG/3ML SOLN Take 3 mLs by nebulization every 6 (six) hours as needed. 360 mL 0   levothyroxine (SYNTHROID) 112 MCG tablet Take 1 tablet (112 mcg total) by mouth daily. 90 tablet 2   lidocaine (LIDODERM) 5 % Place 1 patch onto the skin daily. Remove & Discard patch within 12 hours or as directed by MD     losartan (COZAAR) 50 MG tablet Take 1.5 tablets (75 mg total) by mouth daily. 135 tablet 3   metFORMIN (GLUCOPHAGE) 500 MG tablet Take 1 tablet (500 mg total) by mouth 2 (two) times daily with a meal. (Patient taking differently: Take 1 tablet (500 mg total) by mouth 2 (two) times daily with a meal.) 180 tablet 1   mirtazapine (REMERON) 45 MG tablet Take 1 tablet (45 mg total) by mouth at bedtime. 30 tablet 1   montelukast (SINGULAIR) 10 MG tablet Take 1 tablet (10 mg total) by mouth at bedtime. 90 tablet 2   nebivolol (BYSTOLIC) 10 MG tablet Take 1 tablet (10 mg total) by mouth daily. 90 tablet 3   nicotine (NICODERM CQ - DOSED IN MG/24 HOURS) 21 mg/24hr patch Place 1 patch (21 mg total) onto the skin daily. 28 patch 0   nitroGLYCERIN (NITROSTAT) 0.4 MG SL tablet Place 1 tablet (0.4 mg total) under the tongue every 5 (five) minutes as needed for chest pain. 25 tablet 3   omeprazole (PRILOSEC) 40 MG capsule Take 1 capsule (40 mg total) by mouth 2 (two) times daily before a meal. 60 capsule 1   oxyCODONE (OXY IR/ROXICODONE) 5 MG immediate release tablet Take 1-2 tablets (5-10 mg total) by mouth every 6 (six) hours as needed for up to 5 days for severe pain (pain score 7-10) or moderate pain (pain score 4-6) (5 mg for moderate pain, 10 mg for severe pain). 20 tablet 0   polyethylene glycol (MIRALAX / GLYCOLAX) 17 g packet Take 17 g by mouth daily as needed for severe constipation.     predniSONE  (DELTASONE) 10 MG tablet Take 2 tablets (20 mg total) by mouth daily with breakfast for 1 day, THEN 1 tablet (10 mg total) daily with breakfast for 14 days.     Spacer/Aero-Holding Rudean Curt Use with the albuterol inhaler. 1 each 0   ticagrelor (BRILINTA) 60 MG TABS tablet Take 1 tablet (60 mg total) by mouth 2 (two) times daily. 60 tablet 52   TRUEplus Lancets 28G MISC Use to check blood sugars once a day 100 each 2   VENTOLIN HFA 108 (90 Base) MCG/ACT inhaler INHALE 2 PUFFS into THE lungs EVERY 6 HOURS AS NEEDED FOR wheezing OR SHORTNESS OF BREATH 18 g 10   Zoster Vaccine Adjuvanted [facility]) injection Inject 0.5 mLs into the muscle once for 1 dose. 0.5 mL 0   No facility-administered medications prior to visit.    Review  of Systems  Constitutional:  Negative for chills, fever, malaise/fatigue and weight loss.  HENT:  Negative for congestion, sinus pain and sore throat.   Eyes: Negative.   Respiratory:  Positive for cough, sputum production, shortness of breath and wheezing. Negative for hemoptysis.   Cardiovascular:  Negative for chest pain, palpitations, orthopnea, claudication and leg swelling.  Gastrointestinal:  Negative for abdominal pain, heartburn, nausea and vomiting.  Genitourinary: Negative.   Musculoskeletal:  Negative for joint pain and myalgias.  Skin:  Negative for rash.  Neurological:  Negative for weakness.  Endo/Heme/Allergies: Negative.   Psychiatric/Behavioral:  The patient is nervous/anxious.     Objective:   Vitals:   09/29/23 1028  BP: (!) 115/58  Pulse: 68  SpO2: 93%  Weight: 162 lb 9.6 oz (73.8 kg)  Height: 4\' 9"  (1.448 m)    Physical Exam Constitutional:      General: She is not in acute distress.    Appearance: Normal appearance.  Eyes:     General: No scleral icterus.    Conjunctiva/sclera: Conjunctivae normal.  Cardiovascular:     Rate and Rhythm: Normal rate and regular rhythm.  Pulmonary:     Breath sounds: No wheezing, rhonchi or  rales.  Musculoskeletal:     Right lower leg: No edema.     Left lower leg: No edema.  Skin:    General: Skin is warm and dry.  Neurological:     General: No focal deficit present.    CBC    Component Value Date/Time   WBC 13.4 (H) 09/23/2023 0950   RBC 3.79 (L) 09/23/2023 0950   HGB 12.1 09/23/2023 0950   HGB 14.8 05/20/2023 1119   HCT 37.3 09/23/2023 0950   HCT 44.2 05/20/2023 1119   PLT 443 (H) 09/23/2023 0950   PLT 373 05/20/2023 1119   MCV 98.4 09/23/2023 0950   MCV 99 (H) 05/20/2023 1119   MCH 31.9 09/23/2023 0950   MCHC 32.4 09/23/2023 0950   RDW 15.2 09/23/2023 0950   RDW 13.2 05/20/2023 1119   LYMPHSABS 1.3 09/16/2023 0900   LYMPHSABS 2.6 04/05/2019 0926   MONOABS 1.4 (H) 09/16/2023 0900   EOSABS 0.2 09/16/2023 0900   EOSABS 0.3 04/05/2019 0926   BASOSABS 0.0 09/16/2023 0900   BASOSABS 0.1 04/05/2019 0926      Latest Ref Rng & Units 09/19/2023    3:11 AM 09/18/2023    3:24 AM 09/17/2023    2:43 AM  BMP  Glucose 70 - 99 mg/dL 045  409  811   BUN 8 - 23 mg/dL 26  22  11    Creatinine 0.44 - 1.00 mg/dL 9.14  7.82  9.56   Sodium 135 - 145 mmol/L 137  134  136   Potassium 3.5 - 5.1 mmol/L 4.0  4.1  4.5   Chloride 98 - 111 mmol/L 106  99  107   CO2 22 - 32 mmol/L 27  23  23    Calcium 8.9 - 10.3 mg/dL 8.3  8.4  8.4     Chest imaging: CXR 07/21/23 Cardiac shadow is within normal limits. Aortic calcifications are seen. The lungs are well aerated bilaterally. No focal infiltrate or sizable effusion is noted. No acute bony abnormality is noted. Old healed clavicular fracture is noted.  LCS CT Chest 02/07/23 1. Lung-RADS 3, probably benign findings. Short-term follow-up in 6 months is recommended with repeat low-dose chest CT without contrast (please use the following order, "CT CHEST LCS NODULE FOLLOW-UP W/O CM"). Three  new scattered solid pulmonary nodules, largest 5.0 mm in the posterior left upper lobe. 2. Three-vessel coronary atherosclerosis. 3. Aortic  Atherosclerosis (ICD10-I70.0) and Emphysema (ICD10-J43.9).  PFT:     No data to display          Labs:  Path:  Echo:  Heart Catheterization:     Assessment & Plan:   Pneumothorax, right - Plan: DG Chest 2 View  Discussion: [name] [name] is a 68 year old woman, daily smoker with history of COPD, DMII, GERD, and CAD who is referred to pulmonary clinic for hospital follow up of COPD exacerbation.  Chronic Obstructive Pulmonary Disease (COPD) -Continue trelegy ellipta 200, 1 puff daily -continue prednisone 10mg  daily -Recommend nicotine patches (21mg  daily) and nicotine lozenges (2-4mg  as needed) for smoking cessation.  Chronic Respiratory Failure - continue supplemental oxygen  Pneumothorax s/p fall - resolved   Melody Comas, MD [facility] Office: [phone]   Current Outpatient Medications:    acetaminophen (TYLENOL) 500 MG tablet, Take 2 tablets (1,000 mg total) by mouth every 6 (six) hours., Disp: , Rfl:    alendronate (FOSAMAX) 35 MG tablet, Take 1 tablet (35 mg total) by mouth every 7 (seven) days. Take with a full glass of water on an empty stomach. Sit upright for 30-60 mins after taking, Disp: 12 tablet, Rfl: 1   amLODipine (NORVASC) 5 MG tablet, Take 1 tablet (5 mg total) by mouth daily., Disp: 90 tablet, Rfl: 3   aspirin EC 81 MG tablet, Take 81 mg by mouth in the morning., Disp: , Rfl:    atorvastatin (LIPITOR) 80 MG tablet, Take 1 tablet (80 mg total) by mouth daily., Disp: 90 tablet, Rfl: 3   Blood Glucose Monitoring Suppl (ACCU-CHEK GUIDE) w/Device KIT, Use to check blood sugars once a day, Disp: 1 kit, Rfl: 0   busPIRone (BUSPAR) 15 MG tablet, Take 2 tablets (30 mg total) by mouth in the morning AND 1 tablet (15 mg total) daily at 12 noon AND 1 tablet (15 mg total) every evening., Disp: 120 tablet, Rfl: 1   clonazePAM (KLONOPIN) 0.5 MG tablet, Take 1 tablet (0.5 mg total) by mouth 3 (three) times daily as needed for anxiety.,  Disp: 90 tablet, Rfl: 0   docusate sodium (COLACE) 100 MG capsule, Take 1 capsule (100 mg total) by mouth 2 (two) times daily as needed for mild constipation., Disp: , Rfl:    doxepin (SINEQUAN) 75 MG capsule, Take 1 capsule (75 mg total) by mouth at bedtime., Disp: 30 capsule, Rfl: 1   escitalopram (LEXAPRO) 20 MG tablet, Take 1 tablet (20 mg total) by mouth daily., Disp: 30 tablet, Rfl: 1   ezetimibe (ZETIA) 10 MG tablet, Take 1 tablet (10 mg total) by mouth daily. (Patient taking differently: Take 10 mg by mouth at bedtime.), Disp: 90 tablet, Rfl: 3   Fluticasone-Umeclidin-Vilant (TRELEGY ELLIPTA) 200-62.5-25 MCG/ACT AEPB, Inhale 1 puff into the lungs daily., Disp: 60 each, Rfl: 11   furosemide (LASIX) 20 MG tablet, Take 1 tablet (20 mg total) by mouth daily., Disp: 90 tablet, Rfl: 3   gabapentin (NEURONTIN) 600 MG tablet, TAKE 1 TABLET BY MOUTH 4 TIMES DAILY (Patient taking differently: Take 600 mg by mouth 2 (two) times daily.), Disp: 360 tablet, Rfl: 2   glucose blood (TRUE METRIX BLOOD GLUCOSE TEST) test strip, Use as instructed to check blood sugars once a day, Disp: 100 each, Rfl: 12   guaiFENesin (MUCINEX) 600 MG 12 hr tablet, Take 1 tablet (600  mg total) by mouth 2 (two) times daily for 5 days., Disp: , Rfl:    ipratropium-albuterol (DUONEB) 0.5-2.5 (3) MG/3ML SOLN, Take 3 mLs by nebulization every 6 (six) hours as needed., Disp: 360 mL, Rfl: 0   levothyroxine (SYNTHROID) 112 MCG tablet, Take 1 tablet (112 mcg total) by mouth daily., Disp: 90 tablet, Rfl: 2   lidocaine (LIDODERM) 5 %, Place 1 patch onto the skin daily. Remove & Discard patch within 12 hours or as directed by MD, Disp: , Rfl:    losartan (COZAAR) 50 MG tablet, Take 1.5 tablets (75 mg total) by mouth daily., Disp: 135 tablet, Rfl: 3   metFORMIN (GLUCOPHAGE) 500 MG tablet, Take 1 tablet (500 mg total) by mouth 2 (two) times daily with a meal. (Patient taking differently: Take 1 tablet (500 mg total) by mouth 2 (two) times daily  with a meal.), Disp: 180 tablet, Rfl: 1   mirtazapine (REMERON) 45 MG tablet, Take 1 tablet (45 mg total) by mouth at bedtime., Disp: 30 tablet, Rfl: 1   montelukast (SINGULAIR) 10 MG tablet, Take 1 tablet (10 mg total) by mouth at bedtime., Disp: 90 tablet, Rfl: 2   nebivolol (BYSTOLIC) 10 MG tablet, Take 1 tablet (10 mg total) by mouth daily., Disp: 90 tablet, Rfl: 3   nicotine (NICODERM CQ - DOSED IN MG/24 HOURS) 21 mg/24hr patch, Place 1 patch (21 mg total) onto the skin daily., Disp: 28 patch, Rfl: 0   nitroGLYCERIN (NITROSTAT) 0.4 MG SL tablet, Place 1 tablet (0.4 mg total) under the tongue every 5 (five) minutes as needed for chest pain., Disp: 25 tablet, Rfl: 3   omeprazole (PRILOSEC) 40 MG capsule, Take 1 capsule (40 mg total) by mouth 2 (two) times daily before a meal., Disp: 60 capsule, Rfl: 1   oxyCODONE (OXY IR/ROXICODONE) 5 MG immediate release tablet, Take 1-2 tablets (5-10 mg total) by mouth every 6 (six) hours as needed for up to 5 days for severe pain (pain score 7-10) or moderate pain (pain score 4-6) (5 mg for moderate pain, 10 mg for severe pain)., Disp: 20 tablet, Rfl: 0   polyethylene glycol (MIRALAX / GLYCOLAX) 17 g packet, Take 17 g by mouth daily as needed for severe constipation., Disp: , Rfl:    predniSONE (DELTASONE) 10 MG tablet, Take 2 tablets (20 mg total) by mouth daily with breakfast for 1 day, THEN 1 tablet (10 mg total) daily with breakfast for 14 days., Disp: , Rfl:    Spacer/Aero-Holding Chambers DEVI, Use with the albuterol inhaler., Disp: 1 each, Rfl: 0   ticagrelor (BRILINTA) 60 MG TABS tablet, Take 1 tablet (60 mg total) by mouth 2 (two) times daily., Disp: 60 tablet, Rfl: 52   TRUEplus Lancets 28G MISC, Use to check blood sugars once a day, Disp: 100 each, Rfl: 2   VENTOLIN HFA 108 (90 Base) MCG/ACT inhaler, INHALE 2 PUFFS into THE lungs EVERY 6 HOURS AS NEEDED FOR wheezing OR SHORTNESS OF BREATH, Disp: 18 g, Rfl: 10   Zoster Vaccine Adjuvanted (SHINGRIX)  injection, Inject 0.5 mLs into the muscle once for 1 dose., Disp: 0.5 mL, Rfl: 0

## 2023-09-30 DIAGNOSIS — F411 Generalized anxiety disorder: Secondary | ICD-10-CM | POA: Diagnosis not present

## 2023-09-30 DIAGNOSIS — E039 Hypothyroidism, unspecified: Secondary | ICD-10-CM | POA: Diagnosis not present

## 2023-09-30 DIAGNOSIS — J449 Chronic obstructive pulmonary disease, unspecified: Secondary | ICD-10-CM | POA: Diagnosis not present

## 2023-09-30 DIAGNOSIS — Z8679 Personal history of other diseases of the circulatory system: Secondary | ICD-10-CM | POA: Diagnosis not present

## 2023-09-30 DIAGNOSIS — I10 Essential (primary) hypertension: Secondary | ICD-10-CM | POA: Diagnosis not present

## 2023-09-30 DIAGNOSIS — J441 Chronic obstructive pulmonary disease with (acute) exacerbation: Secondary | ICD-10-CM | POA: Diagnosis not present

## 2023-09-30 DIAGNOSIS — J189 Pneumonia, unspecified organism: Secondary | ICD-10-CM | POA: Diagnosis not present

## 2023-09-30 DIAGNOSIS — E785 Hyperlipidemia, unspecified: Secondary | ICD-10-CM | POA: Diagnosis not present

## 2023-09-30 DIAGNOSIS — F17210 Nicotine dependence, cigarettes, uncomplicated: Secondary | ICD-10-CM | POA: Diagnosis not present

## 2023-09-30 DIAGNOSIS — J9611 Chronic respiratory failure with hypoxia: Secondary | ICD-10-CM | POA: Diagnosis not present

## 2023-09-30 DIAGNOSIS — J939 Pneumothorax, unspecified: Secondary | ICD-10-CM | POA: Diagnosis not present

## 2023-09-30 DIAGNOSIS — E1142 Type 2 diabetes mellitus with diabetic polyneuropathy: Secondary | ICD-10-CM | POA: Diagnosis not present

## 2023-10-01 ENCOUNTER — Other Ambulatory Visit: Payer: Self-pay

## 2023-10-01 ENCOUNTER — Other Ambulatory Visit (HOSPITAL_COMMUNITY): Payer: Self-pay

## 2023-10-01 ENCOUNTER — Other Ambulatory Visit: Payer: Self-pay | Admitting: Internal Medicine

## 2023-10-01 DIAGNOSIS — J939 Pneumothorax, unspecified: Secondary | ICD-10-CM | POA: Diagnosis not present

## 2023-10-01 DIAGNOSIS — F17210 Nicotine dependence, cigarettes, uncomplicated: Secondary | ICD-10-CM | POA: Diagnosis not present

## 2023-10-01 DIAGNOSIS — Z8679 Personal history of other diseases of the circulatory system: Secondary | ICD-10-CM | POA: Diagnosis not present

## 2023-10-01 DIAGNOSIS — I10 Essential (primary) hypertension: Secondary | ICD-10-CM | POA: Diagnosis not present

## 2023-10-01 DIAGNOSIS — J189 Pneumonia, unspecified organism: Secondary | ICD-10-CM | POA: Diagnosis not present

## 2023-10-01 DIAGNOSIS — F411 Generalized anxiety disorder: Secondary | ICD-10-CM | POA: Diagnosis not present

## 2023-10-01 DIAGNOSIS — J9611 Chronic respiratory failure with hypoxia: Secondary | ICD-10-CM | POA: Diagnosis not present

## 2023-10-01 DIAGNOSIS — J4489 Other specified chronic obstructive pulmonary disease: Secondary | ICD-10-CM

## 2023-10-01 DIAGNOSIS — J449 Chronic obstructive pulmonary disease, unspecified: Secondary | ICD-10-CM | POA: Diagnosis not present

## 2023-10-01 DIAGNOSIS — J441 Chronic obstructive pulmonary disease with (acute) exacerbation: Secondary | ICD-10-CM | POA: Diagnosis not present

## 2023-10-02 ENCOUNTER — Other Ambulatory Visit (HOSPITAL_COMMUNITY): Payer: Self-pay

## 2023-10-02 ENCOUNTER — Inpatient Hospital Stay: Payer: Medicare HMO | Admitting: Adult Health

## 2023-10-03 DIAGNOSIS — J939 Pneumothorax, unspecified: Secondary | ICD-10-CM | POA: Diagnosis not present

## 2023-10-03 DIAGNOSIS — J441 Chronic obstructive pulmonary disease with (acute) exacerbation: Secondary | ICD-10-CM | POA: Diagnosis not present

## 2023-10-03 DIAGNOSIS — J9611 Chronic respiratory failure with hypoxia: Secondary | ICD-10-CM | POA: Diagnosis not present

## 2023-10-03 DIAGNOSIS — F411 Generalized anxiety disorder: Secondary | ICD-10-CM | POA: Diagnosis not present

## 2023-10-03 DIAGNOSIS — J189 Pneumonia, unspecified organism: Secondary | ICD-10-CM | POA: Diagnosis not present

## 2023-10-03 DIAGNOSIS — J449 Chronic obstructive pulmonary disease, unspecified: Secondary | ICD-10-CM | POA: Diagnosis not present

## 2023-10-03 DIAGNOSIS — Z8679 Personal history of other diseases of the circulatory system: Secondary | ICD-10-CM | POA: Diagnosis not present

## 2023-10-03 DIAGNOSIS — F17210 Nicotine dependence, cigarettes, uncomplicated: Secondary | ICD-10-CM | POA: Diagnosis not present

## 2023-10-03 DIAGNOSIS — I10 Essential (primary) hypertension: Secondary | ICD-10-CM | POA: Diagnosis not present

## 2023-10-05 ENCOUNTER — Encounter: Payer: Self-pay | Admitting: Pulmonary Disease

## 2023-10-05 ENCOUNTER — Emergency Department (HOSPITAL_COMMUNITY)

## 2023-10-05 ENCOUNTER — Other Ambulatory Visit: Payer: Self-pay

## 2023-10-05 ENCOUNTER — Inpatient Hospital Stay (HOSPITAL_COMMUNITY)
Admission: EM | Admit: 2023-10-05 | Discharge: 2023-10-08 | DRG: 189 | Disposition: A | Discharge status: Home / Self Care | Attending: Internal Medicine | Admitting: Internal Medicine

## 2023-10-05 ENCOUNTER — Observation Stay (HOSPITAL_COMMUNITY)

## 2023-10-05 DIAGNOSIS — I272 Pulmonary hypertension, unspecified: Secondary | ICD-10-CM | POA: Diagnosis present

## 2023-10-05 DIAGNOSIS — Z8249 Family history of ischemic heart disease and other diseases of the circulatory system: Secondary | ICD-10-CM

## 2023-10-05 DIAGNOSIS — I10 Essential (primary) hypertension: Secondary | ICD-10-CM | POA: Diagnosis present

## 2023-10-05 DIAGNOSIS — Z66 Do not resuscitate: Secondary | ICD-10-CM | POA: Diagnosis present

## 2023-10-05 DIAGNOSIS — Z860100 Personal history of colon polyps, unspecified: Secondary | ICD-10-CM

## 2023-10-05 DIAGNOSIS — F17210 Nicotine dependence, cigarettes, uncomplicated: Secondary | ICD-10-CM | POA: Diagnosis present

## 2023-10-05 DIAGNOSIS — J9621 Acute and chronic respiratory failure with hypoxia: Secondary | ICD-10-CM | POA: Diagnosis present

## 2023-10-05 DIAGNOSIS — I252 Old myocardial infarction: Secondary | ICD-10-CM

## 2023-10-05 DIAGNOSIS — T380X5A Adverse effect of glucocorticoids and synthetic analogues, initial encounter: Secondary | ICD-10-CM | POA: Diagnosis present

## 2023-10-05 DIAGNOSIS — Z6835 Body mass index (BMI) 35.0-35.9, adult: Secondary | ICD-10-CM

## 2023-10-05 DIAGNOSIS — E1122 Type 2 diabetes mellitus with diabetic chronic kidney disease: Secondary | ICD-10-CM | POA: Diagnosis present

## 2023-10-05 DIAGNOSIS — E785 Hyperlipidemia, unspecified: Secondary | ICD-10-CM | POA: Diagnosis present

## 2023-10-05 DIAGNOSIS — Z91018 Allergy to other foods: Secondary | ICD-10-CM

## 2023-10-05 DIAGNOSIS — J939 Pneumothorax, unspecified: Secondary | ICD-10-CM | POA: Diagnosis not present

## 2023-10-05 DIAGNOSIS — J440 Chronic obstructive pulmonary disease with acute lower respiratory infection: Secondary | ICD-10-CM | POA: Diagnosis present

## 2023-10-05 DIAGNOSIS — D509 Iron deficiency anemia, unspecified: Secondary | ICD-10-CM | POA: Diagnosis not present

## 2023-10-05 DIAGNOSIS — S2241XD Multiple fractures of ribs, right side, subsequent encounter for fracture with routine healing: Secondary | ICD-10-CM | POA: Diagnosis not present

## 2023-10-05 DIAGNOSIS — W19XXXD Unspecified fall, subsequent encounter: Secondary | ICD-10-CM | POA: Diagnosis present

## 2023-10-05 DIAGNOSIS — E039 Hypothyroidism, unspecified: Secondary | ICD-10-CM | POA: Diagnosis present

## 2023-10-05 DIAGNOSIS — R0602 Shortness of breath: Secondary | ICD-10-CM | POA: Diagnosis not present

## 2023-10-05 DIAGNOSIS — E66813 Obesity, class 3: Secondary | ICD-10-CM | POA: Diagnosis present

## 2023-10-05 DIAGNOSIS — Z885 Allergy status to narcotic agent status: Secondary | ICD-10-CM

## 2023-10-05 DIAGNOSIS — J441 Chronic obstructive pulmonary disease with (acute) exacerbation: Principal | ICD-10-CM | POA: Diagnosis present

## 2023-10-05 DIAGNOSIS — Z1152 Encounter for screening for COVID-19: Secondary | ICD-10-CM

## 2023-10-05 DIAGNOSIS — Z91040 Latex allergy status: Secondary | ICD-10-CM

## 2023-10-05 DIAGNOSIS — Z833 Family history of diabetes mellitus: Secondary | ICD-10-CM

## 2023-10-05 DIAGNOSIS — F419 Anxiety disorder, unspecified: Secondary | ICD-10-CM | POA: Diagnosis present

## 2023-10-05 DIAGNOSIS — I2510 Atherosclerotic heart disease of native coronary artery without angina pectoris: Secondary | ICD-10-CM | POA: Diagnosis present

## 2023-10-05 DIAGNOSIS — J439 Emphysema, unspecified: Secondary | ICD-10-CM | POA: Diagnosis present

## 2023-10-05 DIAGNOSIS — F3342 Major depressive disorder, recurrent, in full remission: Secondary | ICD-10-CM

## 2023-10-05 DIAGNOSIS — Z7902 Long term (current) use of antithrombotics/antiplatelets: Secondary | ICD-10-CM

## 2023-10-05 DIAGNOSIS — R0989 Other specified symptoms and signs involving the circulatory and respiratory systems: Secondary | ICD-10-CM | POA: Diagnosis not present

## 2023-10-05 DIAGNOSIS — Z7984 Long term (current) use of oral hypoglycemic drugs: Secondary | ICD-10-CM

## 2023-10-05 DIAGNOSIS — F319 Bipolar disorder, unspecified: Secondary | ICD-10-CM | POA: Diagnosis present

## 2023-10-05 DIAGNOSIS — Z808 Family history of malignant neoplasm of other organs or systems: Secondary | ICD-10-CM

## 2023-10-05 DIAGNOSIS — Z9981 Dependence on supplemental oxygen: Secondary | ICD-10-CM | POA: Diagnosis not present

## 2023-10-05 DIAGNOSIS — Z955 Presence of coronary angioplasty implant and graft: Secondary | ICD-10-CM

## 2023-10-05 DIAGNOSIS — Z803 Family history of malignant neoplasm of breast: Secondary | ICD-10-CM

## 2023-10-05 DIAGNOSIS — G4700 Insomnia, unspecified: Secondary | ICD-10-CM | POA: Diagnosis present

## 2023-10-05 DIAGNOSIS — Z90710 Acquired absence of both cervix and uterus: Secondary | ICD-10-CM

## 2023-10-05 DIAGNOSIS — Z79890 Hormone replacement therapy (postmenopausal): Secondary | ICD-10-CM | POA: Diagnosis not present

## 2023-10-05 DIAGNOSIS — Z79899 Other long term (current) drug therapy: Secondary | ICD-10-CM | POA: Diagnosis not present

## 2023-10-05 DIAGNOSIS — Z7951 Long term (current) use of inhaled steroids: Secondary | ICD-10-CM

## 2023-10-05 DIAGNOSIS — K219 Gastro-esophageal reflux disease without esophagitis: Secondary | ICD-10-CM | POA: Diagnosis present

## 2023-10-05 DIAGNOSIS — Z818 Family history of other mental and behavioral disorders: Secondary | ICD-10-CM

## 2023-10-05 DIAGNOSIS — Z823 Family history of stroke: Secondary | ICD-10-CM

## 2023-10-05 DIAGNOSIS — J9811 Atelectasis: Secondary | ICD-10-CM | POA: Diagnosis not present

## 2023-10-05 DIAGNOSIS — Z90721 Acquired absence of ovaries, unilateral: Secondary | ICD-10-CM

## 2023-10-05 DIAGNOSIS — Z87440 Personal history of urinary (tract) infections: Secondary | ICD-10-CM

## 2023-10-05 DIAGNOSIS — J90 Pleural effusion, not elsewhere classified: Secondary | ICD-10-CM | POA: Diagnosis not present

## 2023-10-05 DIAGNOSIS — J432 Centrilobular emphysema: Secondary | ICD-10-CM | POA: Diagnosis not present

## 2023-10-05 DIAGNOSIS — J9622 Acute and chronic respiratory failure with hypercapnia: Secondary | ICD-10-CM | POA: Diagnosis present

## 2023-10-05 DIAGNOSIS — J45909 Unspecified asthma, uncomplicated: Secondary | ICD-10-CM

## 2023-10-05 DIAGNOSIS — Z83719 Family history of colon polyps, unspecified: Secondary | ICD-10-CM

## 2023-10-05 DIAGNOSIS — R059 Cough, unspecified: Secondary | ICD-10-CM | POA: Diagnosis not present

## 2023-10-05 LAB — RESP PANEL BY RT-PCR (RSV, FLU A&B, COVID)  RVPGX2
Influenza A by PCR: NEGATIVE
Influenza B by PCR: NEGATIVE
Resp Syncytial Virus by PCR: NEGATIVE
SARS Coronavirus 2 by RT PCR: NEGATIVE

## 2023-10-05 LAB — CBC WITH DIFFERENTIAL/PLATELET
Abs Immature Granulocytes: 0.11 10*3/uL — ABNORMAL HIGH (ref 0.00–0.07)
Basophils Absolute: 0.1 10*3/uL (ref 0.0–0.1)
Basophils Relative: 1 %
Eosinophils Absolute: 0.2 10*3/uL (ref 0.0–0.5)
Eosinophils Relative: 2 %
HCT: 37.6 % (ref 36.0–46.0)
Hemoglobin: 11.9 g/dL — ABNORMAL LOW (ref 12.0–15.0)
Immature Granulocytes: 1 %
Lymphocytes Relative: 28 %
Lymphs Abs: 3.5 10*3/uL (ref 0.7–4.0)
MCH: 32.1 pg (ref 26.0–34.0)
MCHC: 31.6 g/dL (ref 30.0–36.0)
MCV: 101.3 fL — ABNORMAL HIGH (ref 80.0–100.0)
Monocytes Absolute: 1.4 10*3/uL — ABNORMAL HIGH (ref 0.1–1.0)
Monocytes Relative: 11 %
Neutro Abs: 7.4 10*3/uL (ref 1.7–7.7)
Neutrophils Relative %: 57 %
Platelets: 570 10*3/uL — ABNORMAL HIGH (ref 150–400)
RBC: 3.71 MIL/uL — ABNORMAL LOW (ref 3.87–5.11)
RDW: 16 % — ABNORMAL HIGH (ref 11.5–15.5)
WBC: 12.7 10*3/uL — ABNORMAL HIGH (ref 4.0–10.5)
nRBC: 0 % (ref 0.0–0.2)

## 2023-10-05 LAB — GLUCOSE, CAPILLARY
Glucose-Capillary: 184 mg/dL — ABNORMAL HIGH (ref 70–99)
Glucose-Capillary: 189 mg/dL — ABNORMAL HIGH (ref 70–99)
Glucose-Capillary: 170 mg/dL — ABNORMAL HIGH (ref 70–99)

## 2023-10-05 LAB — BASIC METABOLIC PANEL
Anion gap: 9 (ref 5–15)
BUN: 16 mg/dL (ref 8–23)
CO2: 28 mmol/L (ref 22–32)
Calcium: 9.1 mg/dL (ref 8.9–10.3)
Chloride: 103 mmol/L (ref 98–111)
Creatinine, Ser: 0.77 mg/dL (ref 0.44–1.00)
GFR, Estimated: >60 mL/min (ref >60)
Glucose, Bld: 89 mg/dL (ref 70–99)
Potassium: 4 mmol/L (ref 3.5–5.1)
Sodium: 140 mmol/L (ref 135–145)

## 2023-10-05 LAB — PROCALCITONIN: Procalcitonin: <0.1 ng/mL

## 2023-10-05 LAB — D-DIMER, QUANTITATIVE: D-Dimer, Quant: 0.6 ug{FEU}/mL — ABNORMAL HIGH (ref 0.00–0.50)

## 2023-10-05 LAB — BRAIN NATRIURETIC PEPTIDE: B Natriuretic Peptide: 87.1 pg/mL (ref 0.0–100.0)

## 2023-10-05 LAB — C-REACTIVE PROTEIN: CRP: 0.6 mg/dL (ref <1.0)

## 2023-10-05 MED ORDER — DOXYCYCLINE HYCLATE 100 MG PO TABS
100.0000 mg | ORAL_TABLET | Freq: Once | ORAL | Status: AC
  Administered 2023-10-05: 100 mg via ORAL
  Filled 2023-10-05: qty 1

## 2023-10-05 MED ORDER — METHYLPREDNISOLONE SODIUM SUCC 40 MG IJ SOLR
40.0000 mg | Freq: Two times a day (BID) | INTRAMUSCULAR | Status: AC
  Administered 2023-10-05 – 2023-10-07 (×5): 40 mg via INTRAVENOUS
  Filled 2023-10-05 (×5): qty 1

## 2023-10-05 MED ORDER — METHYLPREDNISOLONE SODIUM SUCC 125 MG IJ SOLR
125.0000 mg | Freq: Once | INTRAMUSCULAR | Status: AC
  Administered 2023-10-05: 125 mg via INTRAVENOUS
  Filled 2023-10-05: qty 2

## 2023-10-05 MED ORDER — IPRATROPIUM-ALBUTEROL 0.5-2.5 (3) MG/3ML IN SOLN
3.0000 mL | Freq: Once | RESPIRATORY_TRACT | Status: AC
  Administered 2023-10-05: 3 mL via RESPIRATORY_TRACT
  Filled 2023-10-05: qty 9

## 2023-10-05 MED ORDER — LEVOTHYROXINE SODIUM 112 MCG PO TABS
112.0000 ug | ORAL_TABLET | Freq: Every day | ORAL | Status: DC
  Administered 2023-10-06 – 2023-10-08 (×3): 112 ug via ORAL
  Filled 2023-10-05 (×3): qty 1

## 2023-10-05 MED ORDER — MIRTAZAPINE 15 MG PO TABS
45.0000 mg | ORAL_TABLET | Freq: Every day | ORAL | Status: DC
  Administered 2023-10-05 – 2023-10-07 (×3): 45 mg via ORAL
  Filled 2023-10-05 (×3): qty 3

## 2023-10-05 MED ORDER — MONTELUKAST SODIUM 10 MG PO TABS
10.0000 mg | ORAL_TABLET | Freq: Every day | ORAL | Status: DC
  Administered 2023-10-05 – 2023-10-07 (×3): 10 mg via ORAL
  Filled 2023-10-05 (×3): qty 1

## 2023-10-05 MED ORDER — FUROSEMIDE 40 MG PO TABS
20.0000 mg | ORAL_TABLET | Freq: Every morning | ORAL | Status: DC
Start: 2023-10-06 — End: 2023-10-08
  Administered 2023-10-06 – 2023-10-08 (×3): 20 mg via ORAL
  Filled 2023-10-05 (×3): qty 1

## 2023-10-05 MED ORDER — EZETIMIBE 10 MG PO TABS
10.0000 mg | ORAL_TABLET | Freq: Every day | ORAL | Status: DC
  Administered 2023-10-05 – 2023-10-07 (×3): 10 mg via ORAL
  Filled 2023-10-05 (×3): qty 1

## 2023-10-05 MED ORDER — SODIUM CHLORIDE 0.9% FLUSH
3.0000 mL | Freq: Two times a day (BID) | INTRAVENOUS | Status: DC
  Administered 2023-10-05 – 2023-10-08 (×7): 3 mL via INTRAVENOUS

## 2023-10-05 MED ORDER — ACETAMINOPHEN 650 MG RE SUPP: 650.0000 mg | Freq: Four times a day (QID) | RECTAL | Status: DC | PRN

## 2023-10-05 MED ORDER — NEBIVOLOL HCL 10 MG PO TABS
10.0000 mg | ORAL_TABLET | Freq: Every day | ORAL | Status: DC
  Administered 2023-10-05 – 2023-10-07 (×3): 10 mg via ORAL
  Filled 2023-10-05 (×4): qty 1

## 2023-10-05 MED ORDER — IPRATROPIUM-ALBUTEROL 0.5-2.5 (3) MG/3ML IN SOLN
3.0000 mL | Freq: Four times a day (QID) | RESPIRATORY_TRACT | Status: DC
  Administered 2023-10-05 – 2023-10-06 (×3): 3 mL via RESPIRATORY_TRACT
  Filled 2023-10-05 (×3): qty 3

## 2023-10-05 MED ORDER — AMLODIPINE BESYLATE 5 MG PO TABS
5.0000 mg | ORAL_TABLET | Freq: Every morning | ORAL | Status: DC
Start: 2023-10-06 — End: 2023-10-08
  Administered 2023-10-06 – 2023-10-08 (×3): 5 mg via ORAL
  Filled 2023-10-05 (×3): qty 1

## 2023-10-05 MED ORDER — IPRATROPIUM-ALBUTEROL 0.5-2.5 (3) MG/3ML IN SOLN
3.0000 mL | Freq: Once | RESPIRATORY_TRACT | Status: AC
  Administered 2023-10-05: 3 mL via RESPIRATORY_TRACT

## 2023-10-05 MED ORDER — PANTOPRAZOLE SODIUM 40 MG PO TBEC
40.0000 mg | DELAYED_RELEASE_TABLET | Freq: Every day | ORAL | Status: DC
  Administered 2023-10-05 – 2023-10-08 (×4): 40 mg via ORAL
  Filled 2023-10-05 (×4): qty 1

## 2023-10-05 MED ORDER — LOSARTAN POTASSIUM 50 MG PO TABS
75.0000 mg | ORAL_TABLET | Freq: Every morning | ORAL | Status: DC
Start: 2023-10-06 — End: 2023-10-08
  Administered 2023-10-06 – 2023-10-08 (×3): 75 mg via ORAL
  Filled 2023-10-05 (×3): qty 2

## 2023-10-05 MED ORDER — BUSPIRONE HCL 10 MG PO TABS
30.0000 mg | ORAL_TABLET | Freq: Two times a day (BID) | ORAL | Status: DC
  Administered 2023-10-05 – 2023-10-08 (×6): 30 mg via ORAL
  Filled 2023-10-05: qty 6
  Filled 2023-10-05 (×5): qty 3

## 2023-10-05 MED ORDER — GABAPENTIN 300 MG PO CAPS
600.0000 mg | ORAL_CAPSULE | Freq: Two times a day (BID) | ORAL | Status: DC
  Administered 2023-10-05 – 2023-10-08 (×7): 600 mg via ORAL
  Filled 2023-10-05 (×7): qty 2

## 2023-10-05 MED ORDER — IOHEXOL 350 MG/ML SOLN
61.0000 mL | Freq: Once | INTRAVENOUS | Status: AC | PRN
  Administered 2023-10-05: 61 mL via INTRAVENOUS

## 2023-10-05 MED ORDER — BUDESONIDE 0.25 MG/2ML IN SUSP
0.2500 mg | Freq: Two times a day (BID) | RESPIRATORY_TRACT | Status: DC
  Administered 2023-10-05 – 2023-10-07 (×5): 0.25 mg via RESPIRATORY_TRACT
  Filled 2023-10-05 (×6): qty 2

## 2023-10-05 MED ORDER — ALBUTEROL SULFATE (2.5 MG/3ML) 0.083% IN NEBU: 2.5000 mg | INHALATION_SOLUTION | RESPIRATORY_TRACT | Status: DC | PRN

## 2023-10-05 MED ORDER — NICOTINE 14 MG/24HR TD PT24
14.0000 mg | MEDICATED_PATCH | Freq: Every day | TRANSDERMAL | Status: DC
  Administered 2023-10-05 – 2023-10-08 (×4): 14 mg via TRANSDERMAL
  Filled 2023-10-05 (×4): qty 1

## 2023-10-05 MED ORDER — ENOXAPARIN SODIUM 40 MG/0.4ML IJ SOSY
40.0000 mg | PREFILLED_SYRINGE | INTRAMUSCULAR | Status: DC
  Administered 2023-10-05 – 2023-10-07 (×3): 40 mg via SUBCUTANEOUS
  Filled 2023-10-05 (×3): qty 0.4

## 2023-10-05 MED ORDER — DOXEPIN HCL 25 MG PO CAPS
75.0000 mg | ORAL_CAPSULE | Freq: Every day | ORAL | Status: DC
  Administered 2023-10-05 – 2023-10-07 (×3): 75 mg via ORAL
  Filled 2023-10-05 (×2): qty 3
  Filled 2023-10-05: qty 1
  Filled 2023-10-05 (×2): qty 3

## 2023-10-05 MED ORDER — SODIUM CHLORIDE 0.9 % IV SOLN
1.0000 g | Freq: Once | INTRAVENOUS | Status: AC
  Administered 2023-10-05: 1 g via INTRAVENOUS
  Filled 2023-10-05: qty 10

## 2023-10-05 MED ORDER — ESCITALOPRAM OXALATE 10 MG PO TABS
20.0000 mg | ORAL_TABLET | Freq: Every day | ORAL | Status: DC
  Administered 2023-10-05 – 2023-10-07 (×3): 20 mg via ORAL
  Filled 2023-10-05 (×3): qty 2

## 2023-10-05 MED ORDER — INSULIN ASPART 100 UNIT/ML IJ SOLN
0.0000 [IU] | Freq: Three times a day (TID) | INTRAMUSCULAR | Status: DC
  Administered 2023-10-05 – 2023-10-07 (×6): 1 [IU] via SUBCUTANEOUS
  Administered 2023-10-08: 2 [IU] via SUBCUTANEOUS

## 2023-10-05 MED ORDER — DOXYCYCLINE HYCLATE 100 MG PO TABS
100.0000 mg | ORAL_TABLET | Freq: Two times a day (BID) | ORAL | Status: DC
  Administered 2023-10-05 – 2023-10-08 (×6): 100 mg via ORAL
  Filled 2023-10-05 (×6): qty 1

## 2023-10-05 MED ORDER — ACETAMINOPHEN 325 MG PO TABS
650.0000 mg | ORAL_TABLET | Freq: Four times a day (QID) | ORAL | Status: DC | PRN
  Administered 2023-10-08: 650 mg via ORAL
  Filled 2023-10-05 (×2): qty 2

## 2023-10-05 MED ORDER — CLONAZEPAM 0.5 MG PO TABS
0.5000 mg | ORAL_TABLET | Freq: Three times a day (TID) | ORAL | Status: DC | PRN
  Administered 2023-10-05 – 2023-10-07 (×3): 0.5 mg via ORAL
  Filled 2023-10-05 (×3): qty 1

## 2023-10-05 MED ORDER — ATORVASTATIN CALCIUM 80 MG PO TABS
80.0000 mg | ORAL_TABLET | Freq: Every morning | ORAL | Status: DC
  Administered 2023-10-06 – 2023-10-08 (×3): 80 mg via ORAL
  Filled 2023-10-05 (×3): qty 1

## 2023-10-05 NOTE — Evaluation (Signed)
 Physical Therapy Evaluation Patient Details Name: [name] [name] MRN: [id number] DOB: [birth year] Today's Date: 10/05/2023  History of Present Illness  Pt is a 68 yo presenting to [facility] ED on 3/23 due to cough, shortness of breathe and symptoms similarly to her prior presentations with pneumonia. Prior admits on 09/16/23 after falling out of bed with R sided rib fxs 4-9 and R PTX and 08/31/23 w/ acute COPD exacerbation and Aspiration PNA. PMH: HTN, CAD with multiple DES, DM2, COPD, anxiety, hypothyroidism, RTC repair 12/24/2021, ongoing tobacco abuse.  Clinical Impression  Pt is presenting at Mod I for bed mobility, sit to stand and gait with RW. Pt appears to be at base line level of functioning with no signs/symptoms of cardiac/respiratory distress on 2L O2 per home needs. Currently pt is presenting at baseline level of functioning and no skilled physical therapy services recommended. Pt will be discharged from skilled physical therapy services at this time; please re-consult if further needs arise.            If plan is discharge home, recommend the following: Other (comment) (as needed)   Can travel by private vehicle   Yes    Equipment Recommendations None recommended by PT     Functional Status Assessment Patient has not had a recent decline in their functional status     Precautions / Restrictions Precautions Precautions: Other (comment);Fall Recall of Precautions/Restrictions: Intact Precaution/Restrictions Comments: watch SpO2 Restrictions Weight Bearing Restrictions Per Provider Order: No      Mobility  Bed Mobility Overal bed mobility: Modified Independent Bed Mobility: Supine to Sit, Sit to Supine     Supine to sit: Modified independent (Device/Increase time), HOB elevated Sit to supine: Modified independent (Device/Increase time)        Transfers Overall transfer level: Modified independent Equipment used: Rolling walker (2 wheels) Transfers: Sit to/from Stand Sit  to Stand: Modified independent (Device/Increase time)                Ambulation/Gait Ambulation/Gait assistance: Modified independent (Device/Increase time) Gait Distance (Feet): 25 Feet Assistive device: Rolling walker (2 wheels) Gait Pattern/deviations: WFL(Within Functional Limits) Gait velocity: WNL Gait velocity interpretation: >2.62 ft/sec, indicative of community ambulatory   General Gait Details: on 2L O2 per home needs     Balance Overall balance assessment: Modified Independent   Sitting balance-Leahy Scale: Normal     Standing balance support: During functional activity, Reliant on assistive device for balance, Bilateral upper extremity supported, No upper extremity supported Standing balance-Leahy Scale: Good Standing balance comment: static standing pt does not require UE support, dynamic activities requires bil UE support for balance.         Pertinent Vitals/Pain Pain Assessment Pain Assessment: No/denies pain    Home Living Family/patient expects to be discharged to:: Private residence Living Arrangements: Alone Available Help at Discharge: Family;Available PRN/intermittently Type of Home: Apartment Home Access: Stairs to enter Entrance Stairs-Rails: Left Entrance Stairs-Number of Steps: 2   Home Layout: One level Home Equipment: Agricultural consultant (2 wheels);Shower seat;Hand held shower head;Grab bars - tub/shower;Rollator (4 wheels) Additional Comments: Recent    Prior Function Prior Level of Function : Independent/Modified Independent             Mobility Comments: uses rollator ADLs Comments: does not drive. Mod I with ADLs     Extremity/Trunk Assessment   Upper Extremity Assessment Upper Extremity Assessment: Overall WFL for tasks assessed    Lower Extremity Assessment Lower Extremity Assessment: Overall WFL for tasks assessed  Cervical / Trunk Assessment Cervical / Trunk Assessment: Normal  Communication    Communication Communication: No apparent difficulties    Cognition Arousal: Alert Behavior During Therapy: WFL for tasks assessed/performed   PT - Cognitive impairments: No apparent impairments       PT - Cognition Comments: Appears to be oriented and have insight into medical needs and safety Following commands: Intact             General Comments General comments (skin integrity, edema, etc.): No signs/symptoms of cardiac/respiratory distress during session.        Assessment/Plan    PT Assessment Patient does not need any further PT services         PT Goals (Current goals can be found in the Care Plan section)  Acute Rehab PT Goals PT Goal Formulation: All assessment and education complete, DC therapy     AM-PAC PT "6 Clicks" Mobility  Outcome Measure Help needed turning from your back to your side while in a flat bed without using bedrails?: None Help needed moving from lying on your back to sitting on the side of a flat bed without using bedrails?: None Help needed moving to and from a bed to a chair (including a wheelchair)?: None Help needed standing up from a chair using your arms (e.g., wheelchair or bedside chair)?: None Help needed to walk in hospital room?: None Help needed climbing 3-5 steps with a railing? : None 6 Click Score: 24    End of Session Equipment Utilized During Treatment: Oxygen;Gait belt Activity Tolerance: Patient tolerated treatment well Patient left: with call bell/phone within reach;in bed;with family/visitor present Nurse Communication: Mobility status      Time: 9147-8295 PT Time Calculation (min) (ACUTE ONLY): 10 min   Charges:   PT Evaluation $PT Eval Low Complexity: 1 Low   PT General Charges $$ ACUTE PT VISIT: 1 Visit         Harrel Carina, DPT, CLT  Acute Rehabilitation Services Office: [phone] (Secure chat preferred)   Claudia Desanctis 10/05/2023, 3:49 PM

## 2023-10-05 NOTE — ED Provider Notes (Signed)
 [place] EMERGENCY DEPARTMENT AT [facility] Provider Note   CSN: [id number] Arrival date & time: 10/05/23  0740     History  Chief Complaint  Patient presents with   Cough   Shortness of Breath    EUPHA LOBB is a 68 y.o. female.  68 year old female with past medical history of COPD on 2 L home oxygen as well as hypertension presenting to the emergency department today with cough and shortness of breath.  The patient states that this been worse since yesterday.  She states has been using her home inhalers with no improvement.  She does report some pressure in her chest that she feels when she cannot catch her breath.  She denies any hemoptysis.  Denies a history of DVT or pulmonary embolism, recent surgeries, recent travel.  She was recently admitted a few weeks ago for a traumatic pneumothorax and rib fractures.  She states she was doing better up until yesterday.  She denies any associated fevers.  Denies any significant leg swelling.   Cough Associated symptoms: shortness of breath   Shortness of Breath Associated symptoms: cough        Home Medications Prior to Admission medications   Medication Sig Start Date End Date Taking? Authorizing Provider  acetaminophen (TYLENOL) 500 MG tablet Take 2 tablets (1,000 mg total) by mouth every 6 (six) hours. 09/25/23   Eric Form, PA-C  alendronate (FOSAMAX) 35 MG tablet Take 1 tablet (35 mg total) by mouth every 7 (seven) days. Take with a full glass of water on an empty stomach. Sit upright for 30-60 mins after taking 09/16/23   [name], MD  amLODipine (NORVASC) 5 MG tablet Take 1 tablet (5 mg total) by mouth daily. 09/22/23   Joylene Grapes, NP  aspirin EC 81 MG tablet Take 81 mg by mouth in the morning.    [provider]  atorvastatin (LIPITOR) 80 MG tablet Take 1 tablet (80 mg total) by mouth daily. 09/22/23   Joylene Grapes, NP  Blood Glucose Monitoring Suppl (ACCU-CHEK GUIDE) w/Device KIT Use  to check blood sugars once a day 09/05/23   [name], MD  busPIRone (BUSPAR) 15 MG tablet Take 2 tablets (30 mg total) by mouth in the morning AND 1 tablet (15 mg total) daily at 12 noon AND 1 tablet (15 mg total) every evening. 09/26/23   Nwoko, Tommas Olp, PA  clonazePAM (KLONOPIN) 0.5 MG tablet Take 1 tablet (0.5 mg total) by mouth 3 (three) times daily as needed for anxiety. 09/26/23   Nwoko, Tommas Olp, PA  docusate sodium (COLACE) 100 MG capsule Take 1 capsule (100 mg total) by mouth 2 (two) times daily as needed for mild constipation. 09/25/23   Eric Form, PA-C  doxepin (SINEQUAN) 75 MG capsule Take 1 capsule (75 mg total) by mouth at bedtime. 09/26/23   Nwoko, Tommas Olp, PA  escitalopram (LEXAPRO) 20 MG tablet Take 1 tablet (20 mg total) by mouth daily. 09/26/23   Nwoko, Tommas Olp, PA  ezetimibe (ZETIA) 10 MG tablet Take 1 tablet (10 mg total) by mouth daily. Patient taking differently: Take 10 mg by mouth at bedtime. 07/04/23   Azalee Course, PA  Fluticasone-Umeclidin-Vilant (TRELEGY ELLIPTA) 200-62.5-25 MCG/ACT AEPB Inhale 1 puff into the lungs daily. 08/07/23   Martina Sinner, MD  furosemide (LASIX) 20 MG tablet Take 1 tablet (20 mg total) by mouth daily. 09/22/23   Joylene Grapes, NP  gabapentin (NEURONTIN) 600  MG tablet TAKE 1 TABLET BY MOUTH 4 TIMES DAILY Patient taking differently: Take 600 mg by mouth 2 (two) times daily. 12/20/22   [name], MD  glucose blood (TRUE METRIX BLOOD GLUCOSE TEST) test strip Use as instructed to check blood sugars once a day 09/05/23   [name], MD  ipratropium-albuterol (DUONEB) 0.5-2.5 (3) MG/3ML SOLN Take 3 mLs by nebulization every 6 (six) hours as needed. 07/23/23   Faith Rogue, DO  levothyroxine (SYNTHROID) 112 MCG tablet Take 1 tablet (112 mcg total) by mouth daily. 12/20/22   [name], MD  lidocaine (LIDODERM) 5 % Place 1 patch onto the skin daily. Remove & Discard patch within 12 hours or as directed by MD 09/25/23    Eric Form, PA-C  losartan (COZAAR) 50 MG tablet Take 1.5 tablets (75 mg total) by mouth daily. 09/18/22   Joylene Grapes, NP  metFORMIN (GLUCOPHAGE) 500 MG tablet Take 1 tablet (500 mg total) by mouth 2 (two) times daily with a meal. Patient taking differently: Take 1 tablet (500 mg total) by mouth 2 (two) times daily with a meal. 04/23/23   [name], MD  mirtazapine (REMERON) 45 MG tablet Take 1 tablet (45 mg total) by mouth at bedtime. 09/26/23   Nwoko, Tommas Olp, PA  montelukast (SINGULAIR) 10 MG tablet Take 1 tablet (10 mg total) by mouth at bedtime. 12/20/22   [name], MD  nebivolol (BYSTOLIC) 10 MG tablet Take 1 tablet (10 mg total) by mouth daily. 09/22/23   Joylene Grapes, NP  nicotine (NICODERM CQ - DOSED IN MG/24 HOURS) 21 mg/24hr patch Place 1 patch (21 mg total) onto the skin daily. 09/06/23   Jerald Kief, MD  nitroGLYCERIN (NITROSTAT) 0.4 MG SL tablet Place 1 tablet (0.4 mg total) under the tongue every 5 (five) minutes as needed for chest pain. 09/18/22 08/31/24  Joylene Grapes, NP  omeprazole (PRILOSEC) 40 MG capsule Take 1 capsule (40 mg total) by mouth 2 (two) times daily before a meal. 09/08/23   Lemmon, Violet Baldy, PA  polyethylene glycol (MIRALAX / GLYCOLAX) 17 g packet Take 17 g by mouth daily as needed for severe constipation. 09/25/23   Eric Form, PA-C  predniSONE (DELTASONE) 10 MG tablet Take 2 tablets (20 mg total) by mouth daily with breakfast for 1 day, THEN 1 tablet (10 mg total) daily with breakfast for 14 days. 09/26/23 10/11/23  Eric Form, PA-C  Spacer/Aero-Holding Rudean Curt Use with the albuterol inhaler. 07/22/23   Marrianne Mood, MD  ticagrelor (BRILINTA) 60 MG TABS tablet Take 1 tablet (60 mg total) by mouth 2 (two) times daily. 09/18/22   Joylene Grapes, NP  TRUEplus Lancets 28G MISC Use to check blood sugars once a day 09/05/23   [name], MD  VENTOLIN HFA 108 (90 Base) MCG/ACT inhaler INHALE 2 PUFFS into THE lungs  EVERY 6 HOURS AS NEEDED FOR wheezing OR SHORTNESS OF BREATH 09/22/23   [name], MD  Zoster Vaccine Adjuvanted [facility]) injection Inject 0.5 mLs into the muscle once for 1 dose. 05/20/23 05/20/23  [name], MD      Allergies    Avocado, Latex, and Codeine    Review of Systems   Review of Systems  Respiratory:  Positive for cough and shortness of breath.   All other systems reviewed and are negative.   Physical Exam Updated Vital Signs BP 133/60   Pulse 67  Temp (!) 97.5 F (36.4 C) (Oral)   Resp 12   Ht 4\' 9"  (1.448 m)   Wt 73.8 kg   SpO2 98%   BMI 35.19 kg/m  Physical Exam Vitals and nursing note reviewed.   Gen: Mild conversational dyspnea noted Eyes: PERRL, EOMI HEENT: no oropharyngeal swelling Neck: trachea midline Resp: Diffuse wheezes and rhonchi throughout all lung fields Card: RRR, no murmurs, rubs, or gallops Abd: nontender, nondistended Extremities: no calf tenderness, no edema Vascular: 2+ radial pulses bilaterally, 2+ DP pulses bilaterally Neuro: No focal deficits Skin: no rashes Psyc: acting appropriately   ED Results / Procedures / Treatments   Labs (all labs ordered are listed, but only abnormal results are displayed) Labs Reviewed  CBC WITH DIFFERENTIAL/PLATELET - Abnormal; Notable for the following components:      Result Value   WBC 12.7 (*)    RBC 3.71 (*)    Hemoglobin 11.9 (*)    MCV 101.3 (*)    RDW 16.0 (*)    Platelets 570 (*)    Monocytes Absolute 1.4 (*)    Abs Immature Granulocytes 0.11 (*)    All other components within normal limits  RESP PANEL BY RT-PCR (RSV, FLU A&B, COVID)  RVPGX2  BASIC METABOLIC PANEL  BRAIN NATRIURETIC PEPTIDE    EKG EKG Interpretation Date/Time:  Sunday October 05 2023 07:59:24 EDT Ventricular Rate:  71 PR Interval:  149 QRS Duration:  87 QT Interval:  405 QTC Calculation: 441 R Axis:   78  Text Interpretation: Sinus rhythm Low voltage, precordial leads Confirmed by Beckey Downing [phone]) on 10/05/2023 8:02:30 AM  Radiology DG Chest 2 View Result Date: 10/05/2023 CLINICAL DATA:  68 year old female status post fall with right side rib fractures and pneumothorax on 09/16/2023. Shortness of breath. EXAM: CHEST - 2 VIEW COMPARISON:  Chest radiographs 09/29/2023 and earlier. FINDINGS: PA and lateral views 0806 hours. Mildly lower lung volumes. Mediastinal contours remain normal. Bilateral pleural effusions persist, not significantly changed. No pneumothorax. No confluent lung opacity. Osteopenia. Healing right rib fractures are less apparent. No new osseous abnormality identified. Negative visible bowel gas. IMPRESSION: 1. Ongoing small bilateral pleural effusions. No pneumothorax or other acute cardiopulmonary abnormality. 2. Healing right rib fractures. Electronically Signed   By: Odessa Fleming M.D.   On: 10/05/2023 08:24    Procedures Procedures    Medications Ordered in ED Medications  cefTRIAXone (ROCEPHIN) 1 g in sodium chloride 0.9 % 100 mL IVPB (has no administration in time range)  ipratropium-albuterol (DUONEB) 0.5-2.5 (3) MG/3ML nebulizer solution 3 mL (3 mLs Nebulization Given 10/05/23 0756)  ipratropium-albuterol (DUONEB) 0.5-2.5 (3) MG/3ML nebulizer solution 3 mL (3 mLs Nebulization Given 10/05/23 0756)  ipratropium-albuterol (DUONEB) 0.5-2.5 (3) MG/3ML nebulizer solution 3 mL (3 mLs Nebulization Given 10/05/23 0756)  methylPREDNISolone sodium succinate (SOLU-MEDROL) 125 mg/2 mL injection 125 mg (125 mg Intravenous Given 10/05/23 0756)  doxycycline (VIBRA-TABS) tablet 100 mg (100 mg Oral Given 10/05/23 0755)    ED Course/ Medical Decision Making/ A&P                                 Medical Decision Making 68 year old female with past medical history of COPD on 2 L home oxygen presenting to the emergency department today with cough and shortness of breath.  I will further evaluate the patient here with basic labs well as an EKG, chest x-ray, and troponin for  further evaluation for ACS,  pulmonary edema, pulmonary infiltrates, pneumothorax.  I will give the patient Solu-Medrol Hilliary as well as DuoNebs and doxycycline as this very well may be due to exacerbation of her COPD.  Will obtain RSV/COVID/flu swab to evaluate for viral causes.  I will reevaluate for ultimate disposition.  The patient's workup here is reassuring overall.  Chest x-ray small pleural effusions on her x-ray.  She is still having conversational dyspnea after multiple DuoNebs.  The patient is still having a lot of coarse breath sounds as well.  She is given Rocephin in addition to the doxycycline for likely pneumonia.  She did have the multiple rib fractures less than 2 weeks ago so I think she would benefit from admission for further breathing treatments and symptomatic treatment.  Amount and/or Complexity of Data Reviewed Labs: ordered. Radiology: ordered.  Risk Prescription drug management. Decision regarding hospitalization.           Final Clinical Impression(s) / ED Diagnoses Final diagnoses:  COPD exacerbation (HCC)    Rx / DC Orders ED Discharge Orders     None         Durwin Glaze, MD 10/05/23 1040

## 2023-10-05 NOTE — ED Triage Notes (Signed)
 Patient by [facility] x2 days of cough, SOB, similar symptoms to pneumonia. No new pain. Patient alert and oriented x4. No chest pain, nausea, vomiting.   EMS vitals 162 palp BP 76 HR  RR 18 Spo2 95 CBG 106

## 2023-10-05 NOTE — ED Notes (Signed)
 Ambulated pt while walking, pt started at 96% on 2L at rest and stayed around 93% on 2L while ambulating. Pt reports dizziness while ambulating.

## 2023-10-05 NOTE — Plan of Care (Signed)

## 2023-10-05 NOTE — ED Notes (Signed)
 Patient transported to X-ray

## 2023-10-05 NOTE — H&P (Signed)
 History and Physical    Patient: [name] [name] EAV:[id number] DOB: [birth year] DOA: 10/05/2023 DOS: the patient was seen and examined on 10/05/2023 PCP: Martina Sinner, MD  Patient coming from: Home-currently at a skilled nursing facility for post Hospital rehabilitative therapies  Medical readiness/disposition: Anticipate patient may be ready for discharge as soon as 10/02/2023 pending response to medical therapies and current evaluation.  Will return back short-term to skilled nursing facility for rehab before returning back to her previous home environment.  Chief Complaint:  Chief Complaint  Patient presents with   Cough   Shortness of Breath   HPI: [name] [name] is a 68 y.o. 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  Recent history significant for fall at home on 3/4 that resulted in multiple right rib fractures and small pneumothorax.  She was admitted to the hospital and was discharged on 3/11 to a skilled nursing facility for further rehabilitative therapies.  Since that time patient states her respiratory status has been waxing and waning.  She reports that on the date of representation to the hospital for this admission that she developed intractable coughing.  She notes that she is typically on Trelegy as well as has DuoNebs for use at home but unfortunately the DuoNebs per her report were not ordered for use at the nursing facility.  She had recently followed up with her pulmonologist for a posthospital evaluation on 3/17.  Because of wet sounding cough and increasing sputum her Trelegy was increased to a higher dose.  She was started on a prednisone taper and Z-Pak for her COPD exacerbation and she was started on nicotine patches.   PFTs were planned as well as possible sleep apnea testing and lung cancer screening was ordered per the pulmonologist note.  Guarding this admission patient was brought to the ER by [facility] after reported 2 days of increased cough shortness of breath and symptoms that apparently are similar to her prior presentations with pneumonia.  Her chest x-ray was unremarkable.  She did have leukocytosis but as noted she has been on a recent prednisone taper and azithromycin.  PCR for COVID, flu and RSV were negative.  She was afebrile and vital signs were otherwise stable.  BNP was 87.  Because of her symptoms and concern over evolving pneumonia in the setting of severe lung disease and recent rib fractures hospitalist service has been asked to evaluate the patient for admission.   Review of Systems: As mentioned in the history of present illness. All other systems reviewed and are negative. Past Medical History:  Diagnosis Date   Allergy    seasonal   Anemia    Anxiety    takes Lexapro daily   Arthritis    "back, from neck down pass my bra area" (03/25/2017)   Asthma    Bartholin gland cyst 08/29/2011   Bruises easily    pt is on Effient   Chronic back pain    herniated nucleus pulposus   Chronic back pain    "neck to bra area; lower back" (03/25/2017)   Chronic kidney disease    recurrent UTI's this year 2022   COPD (chronic obstructive pulmonary disease) (HCC)    early stages   Coronary artery disease    Depression  takes Klonopin daily   Diabetes mellitus without complication (HCC)    Diverticulosis    Fibroadenoma of left breast    GERD (gastroesophageal reflux disease)    takes Nexium daily   H/O hiatal hernia    Heart attack (HCC) 2011   Hemorrhoids    Hernia    Hyperlipidemia    takes Lipitor daily   Hypertension    takes Losartan daily and Labetalol bid   Hypothyroidism    takes Synthroid daily   Insomnia    hydroxyzine prn   Joint pain    Pneumonia     "couple times" (03/25/2017)   Pre-diabetes    "just found out 1 wk ago" (03/25/2017)   Psoriasis    elbows,knees,back   Shortness of breath    with exertion   Slowing of urinary stream    Stress incontinence    Past Surgical History:  Procedure Laterality Date   ABDOMINAL EXPLORATION SURGERY  1977   ABDOMINAL HYSTERECTOMY  1977   "left one of my ovaries"   BACK SURGERY     BIOPSY  05/27/2019   Procedure: BIOPSY;  Surgeon: Corbin Ade, MD;  Location: [facility];  Service: Endoscopy;;   BREAST BIOPSY Left 11/2021   x2   COLONOSCOPY  2011   Dr. Christella Hartigan: Mild diverticulosis, descending diminutive colon polyp (not retrieved), next colonoscopy 10 years   CORONARY ANGIOPLASTY WITH STENT PLACEMENT  2011 X2   "regular stents didn't work; had to go back in in ~ 1 month and put in medicated stents"   DILATION AND CURETTAGE OF UTERUS     ESOPHAGOGASTRODUODENOSCOPY     ESOPHAGOGASTRODUODENOSCOPY (EGD) WITH PROPOFOL N/A 05/27/2019   normal esophagus, dilation, erosive gastropathy s/p biopsy, normal duodenum. Negative H.pylori.    LEFT HEART CATH AND CORONARY ANGIOGRAPHY N/A 03/26/2017   Procedure: LEFT HEART CATH AND CORONARY ANGIOGRAPHY;  Surgeon: Lyn Records, MD;  Location: [facility];  Service: Cardiovascular;  Laterality: N/A;   LUMBAR LAMINECTOMY/DECOMPRESSION MICRODISCECTOMY  08/05/2011   Procedure: LUMBAR LAMINECTOMY/DECOMPRESSION MICRODISCECTOMY;  Surgeon: Clydene Fake, MD;  Location: [facility];  Service: Neurosurgery;  Laterality: Right;  Right Lumbar four-five extraforaminal discectomy   MALONEY DILATION N/A 05/27/2019   Procedure: Elease Hashimoto DILATION;  Surgeon: Corbin Ade, MD;  Location: [facility];  Service: Endoscopy;  Laterality: N/A;  54   SHOULDER ARTHROSCOPY WITH ROTATOR CUFF REPAIR AND OPEN BICEPS TENODESIS Right 12/24/2021   Procedure: RIGHT SHOULDER ARTHROSCOPY WITH ROTATOR CUFF REPAIR AND BICEPS TENODESIS;  Surgeon: Eldred Manges, MD;  Location: [facility]  OR;  Service: Orthopedics;  Laterality: Right;   TONSILLECTOMY     as a child   UPPER GASTROINTESTINAL ENDOSCOPY     Social History:  reports that she has been smoking cigarettes. She has a 25 pack-year smoking history. She has never used smokeless tobacco. She reports current alcohol use. She reports current drug use. Drug: Marijuana.  Allergies  Allergen Reactions   Avocado Anaphylaxis   Latex Shortness Of Breath and Rash   Codeine Nausea Only    Family History  Problem Relation Age of Onset   Heart disease Mother    Hypertension Mother    Stroke Mother    Mental illness Mother    Heart disease Father    Hypertension Father    Diabetes Father    Colon polyps Brother    Heart disease Maternal Grandfather    Cancer Paternal Grandmother  mouth   Anesthesia problems Daughter    Breast cancer Maternal Aunt    Throat cancer Maternal Uncle    Thyroid disease Paternal Aunt    Hypotension Neg Hx    Malignant hyperthermia Neg Hx    Pseudochol deficiency Neg Hx    Colon cancer Neg Hx    Stomach cancer Neg Hx    Esophageal cancer Neg Hx    Pancreatic cancer Neg Hx    Rectal cancer Neg Hx     Prior to Admission medications   Medication Sig Start Date End Date Taking? Authorizing Provider  acetaminophen (TYLENOL) 500 MG tablet Take 2 tablets (1,000 mg total) by mouth every 6 (six) hours. Patient taking differently: Take 1,000 mg by mouth every 6 (six) hours as needed for mild pain (pain score 1-3) or moderate pain (pain score 4-6). 09/25/23  Yes Eric Form, PA-C  amLODipine (NORVASC) 5 MG tablet Take 1 tablet (5 mg total) by mouth daily. Patient taking differently: Take 5 mg by mouth in the morning. 09/22/23  Yes Monge, Petra Kuba, NP  aspirin EC 81 MG tablet Take 81 mg by mouth in the morning.   Yes [provider]  atorvastatin (LIPITOR) 80 MG tablet Take 1 tablet (80 mg total) by mouth daily. Patient taking differently: Take 80 mg by mouth in the morning.  09/22/23  Yes Monge, Petra Kuba, NP  busPIRone (BUSPAR) 15 MG tablet Take 2 tablets (30 mg total) by mouth in the morning AND 1 tablet (15 mg total) daily at 12 noon AND 1 tablet (15 mg total) every evening. Patient taking differently: Take 2 tablets (30 mg total) by mouth in the morning and 2 tablets (30 mg total) every night. 09/26/23  Yes Nwoko, Tommas Olp, PA  clonazePAM (KLONOPIN) 0.5 MG tablet Take 1 tablet (0.5 mg total) by mouth 3 (three) times daily as needed for anxiety. Patient taking differently: Take 0.5 mg by mouth in the morning and at bedtime. 09/26/23  Yes Nwoko, Tommas Olp, PA  docusate sodium (COLACE) 100 MG capsule Take 1 capsule (100 mg total) by mouth 2 (two) times daily as needed for mild constipation. 09/25/23  Yes Eric Form, PA-C  doxepin (SINEQUAN) 75 MG capsule Take 1 capsule (75 mg total) by mouth at bedtime. 09/26/23  Yes Nwoko, Uchenna E, PA  escitalopram (LEXAPRO) 20 MG tablet Take 1 tablet (20 mg total) by mouth daily. Patient taking differently: Take 20 mg by mouth at bedtime. 09/26/23  Yes Nwoko, Tommas Olp, PA  ezetimibe (ZETIA) 10 MG tablet Take 1 tablet (10 mg total) by mouth daily. Patient taking differently: Take 10 mg by mouth at bedtime. 07/04/23  Yes Meng, Wynema Birch, PA  Fluticasone-Umeclidin-Vilant (TRELEGY ELLIPTA) 200-62.5-25 MCG/ACT AEPB Inhale 1 puff into the lungs daily. Patient taking differently: Inhale 1 puff into the lungs in the morning. 08/07/23  Yes Martina Sinner, MD  furosemide (LASIX) 20 MG tablet Take 1 tablet (20 mg total) by mouth daily. Patient taking differently: Take 20 mg by mouth in the morning. 09/22/23  Yes Monge, Petra Kuba, NP  gabapentin (NEURONTIN) 600 MG tablet TAKE 1 TABLET BY MOUTH 4 TIMES DAILY Patient taking differently: Take 600 mg by mouth 2 (two) times daily. 12/20/22  Yes [name], MD  ipratropium-albuterol (DUONEB) 0.5-2.5 (3) MG/3ML SOLN Take 3 mLs by nebulization every 6 (six) hours as needed. Patient taking  differently: Take 3 mLs by nebulization every 8 (eight) hours. 07/23/23  Yes Faith Rogue, DO  levothyroxine (SYNTHROID) 112 MCG tablet Take 1 tablet (112 mcg total) by mouth daily. Patient taking differently: Take 112 mcg by mouth daily before breakfast. 12/20/22  Yes [name], MD  lidocaine (LIDODERM) 5 % Place 1 patch onto the skin daily. Remove & Discard patch within 12 hours or as directed by MD 09/25/23  Yes Eric Form, PA-C  losartan (COZAAR) 50 MG tablet Take 1.5 tablets (75 mg total) by mouth daily. Patient taking differently: Take 75 mg by mouth in the morning. 09/18/22  Yes Monge, Petra Kuba, NP  metFORMIN (GLUCOPHAGE) 500 MG tablet Take 1 tablet (500 mg total) by mouth 2 (two) times daily with a meal. Patient taking differently: Take 1 tablet (500 mg total) by mouth 2 (two) times daily with a meal. 04/23/23  Yes [name], MD  mirtazapine (REMERON) 45 MG tablet Take 1 tablet (45 mg total) by mouth at bedtime. 09/26/23  Yes Nwoko, Uchenna E, PA  montelukast (SINGULAIR) 10 MG tablet Take 1 tablet (10 mg total) by mouth at bedtime. 12/20/22  Yes [name], MD  nicotine (NICODERM CQ - DOSED IN MG/24 HOURS) 21 mg/24hr patch Place 1 patch (21 mg total) onto the skin daily. 09/06/23  Yes Jerald Kief, MD  nitroGLYCERIN (NITROSTAT) 0.4 MG SL tablet Place 1 tablet (0.4 mg total) under the tongue every 5 (five) minutes as needed for chest pain. 09/18/22 08/31/24 Yes Monge, Petra Kuba, NP  omeprazole (PRILOSEC) 40 MG capsule Take 1 capsule (40 mg total) by mouth 2 (two) times daily before a meal. 09/08/23  Yes Lemmon, Violet Baldy, PA  polyethylene glycol (MIRALAX / GLYCOLAX) 17 g packet Take 17 g by mouth daily as needed for severe constipation. 09/25/23  Yes Eric Form, PA-C  predniSONE (DELTASONE) 10 MG tablet Take 2 tablets (20 mg total) by mouth daily with breakfast for 1 day, THEN 1 tablet (10 mg total) daily with breakfast for 14 days. 09/26/23 10/11/23 Yes Eric Form, PA-C  ticagrelor (BRILINTA) 60 MG TABS tablet Take 1 tablet (60 mg total) by mouth 2 (two) times daily. 09/18/22  Yes Monge, Petra Kuba, NP  VENTOLIN HFA 108 (90 Base) MCG/ACT inhaler INHALE 2 PUFFS into THE lungs EVERY 6 HOURS AS NEEDED FOR wheezing OR SHORTNESS OF BREATH 09/22/23  Yes [name], MD  alendronate (FOSAMAX) 35 MG tablet Take 1 tablet (35 mg total) by mouth every 7 (seven) days. Take with a full glass of water on an empty stomach. Sit upright for 30-60 mins after taking Patient not taking: Reported on 10/05/2023 09/16/23   [name], MD  Blood Glucose Monitoring Suppl (ACCU-CHEK GUIDE) w/Device KIT Use to check blood sugars once a day 09/05/23   [name], MD  glucose blood (TRUE METRIX BLOOD GLUCOSE TEST) test strip Use as instructed to check blood sugars once a day 09/05/23   [name], MD  nebivolol (BYSTOLIC) 10 MG tablet Take 1 tablet (10 mg total) by mouth daily. Patient taking differently: Take 10 mg by mouth at bedtime. 09/22/23   Joylene Grapes, NP  Spacer/Aero-Holding Rudean Curt Use with the albuterol inhaler. 07/22/23   Marrianne Mood, MD  TRUEplus Lancets 28G MISC Use to check blood sugars once a day 09/05/23   [name], MD    Physical Exam: Vitals:   10/05/23 0745 10/05/23 0748 10/05/23 0757 10/05/23 1100  BP:  133/60  101/65  Pulse:  67  71  Resp:  12  15  Temp:   (!) 97.5 F (36.4 C)   TempSrc:   Oral   SpO2:  98%  98%  Weight: 73.8 kg     Height: 4\' 9"  (1.448 m)      Constitutional: NAD, calm, comfortable Respiratory: Faint inspiratory wheezing bilaterally on posterior exam.  Patient also has very coarse sounding expiratory lung sounds consistent with rhonchi throughout all lung fields.  She actually has good airway movement.  O2 is at 3 L.  She does become dyspneic with conversational speech.  At rest there is no use of accessory muscles. Cardiovascular: Regular rate and rhythm, no murmurs / rubs / gallops. No  extremity edema. 2+ pedal pulses.  Abdomen: no tenderness, no masses palpated. No hepatosplenomegaly. Bowel sounds positive.  Musculoskeletal: no clubbing / cyanosis. No joint deformity upper and lower extremities. Good ROM, no contractures. Normal muscle tone.  Skin: no rashes, lesions, ulcers. No induration Neurologic: CN 2-12 grossly intact. Sensation intact, DTR normal. Strength 5/5 x all 4 extremities.  Psychiatric: Normal judgment and insight. Alert and oriented x 3. Normal mood.     Data Reviewed:  Sodium 140, potassium 4.0, glucose 89, BUN 16, creatinine 0.77  BNP 87  WBCs 12,700 with normal differential, hemoglobin 11.9, platelets 570,000, MCV 101.3  Viral PCR as above  2 view chest x-ray: Ongoing small bilateral pleural effusions, no pneumothorax or other acute abnormalities.  Healing right rib fractures  EKG: Sinus rhythm without any acute ischemic changes, QTc 441 ms  Assessment and Plan: Acute on chronic hypoxemic respiratory failure COPD/asthma Possible evolving pneumonia Maintenance at home is with Trelegy and does have DuoNeb nebulizers available for breakthrough symptoms Recently followed up with pulmonologist after past hospitalization and Trelegy dose was increased Continue O2 which is currently at baseline levels Will hold Trelegy for now in favor of scheduled DuoNebs and budesonide nebulizers Solu-Medrol 40 mg IV every 12 hours Continue preadmission Singulair Incentive spirometry No peripheral edema but given recent hospitalization as a precaution we will check D-dimer and CTA of chest to rule out PE as well as characterize any possible findings that may indicate pneumonia Check procalcitonin and CRP Does have leukocytosis but this likely is secondary to recent prednisone taper Did complete a Z-Pak that began on 3/17.  Will initiate doxycycline tablets for now-please note that although previous has had QT prolongation most recent EKG demonstrates QTc of 441  ms Echocardiogram from December 2024 reviewed and there is no evidence of TR or cor pulmonale.  Study was inadequate for review of pulmonary hypertension  Recent fall with rib fractures Discharge on 3/11 after that hospitalization to skilled nursing facility for rehab and anticipate she will need to return Will go ahead and PT/OT evaluation this admission  Tobacco abuse Counseled regarding cessation especially in context of requirement for ongoing chronic oxygen use and safety risks with concomitant use of oxygen while smoking  Prediabetes Will follow CBGs especially while using steroids  History of CAD with prior PTCA/prior MI No typical coronary ischemic symptoms and EKG unremarkable Continue Zetia, Lipitor, and Bystolic  Hypertension Continue preadmission Norvasc and Cozaar as well as Lasix As noted above patient has normal echocardiogram with no evidence of diastolic dysfunction  Bipolar disorder Continue preadmission Remeron, Lexapro, as needed Klonopin, doxepin, and BuSpar  Hypothyroidism Continue Synthroid  Iron deficiency anemia Hemoglobin stable at 11.9 with a macrocytic profile   Advance Care Planning:   Code Status: Limited: Do not attempt resuscitation (DNR) -DNR-LIMITED -Do Not Intubate/DNI  discussed at length with patient.  She does want DNR without CPR or intubation but otherwise does request aggressive care i.e. antibiotics, fluids, short-term pressors, BiPAP etc.  VTE prophylaxis: Lovenox  Consults: None  Family Communication: Patient only  Severity of Illness: The appropriate patient status for this patient is OBSERVATION. Observation status is judged to be reasonable and necessary in order to provide the required intensity of service to ensure the patient's safety. The patient's presenting symptoms, physical exam findings, and initial radiographic and laboratory data in the context of their medical condition is felt to place them at decreased risk for  further clinical deterioration. Furthermore, it is anticipated that the patient will be medically stable for discharge from the hospital within 2 midnights of admission.   Author: Junious Silk, NP 10/05/2023 12:27 PM  For on call review [email or web address].

## 2023-10-06 DIAGNOSIS — E785 Hyperlipidemia, unspecified: Secondary | ICD-10-CM | POA: Diagnosis present

## 2023-10-06 DIAGNOSIS — D509 Iron deficiency anemia, unspecified: Secondary | ICD-10-CM | POA: Diagnosis present

## 2023-10-06 DIAGNOSIS — E1122 Type 2 diabetes mellitus with diabetic chronic kidney disease: Secondary | ICD-10-CM | POA: Diagnosis present

## 2023-10-06 DIAGNOSIS — J90 Pleural effusion, not elsewhere classified: Secondary | ICD-10-CM | POA: Diagnosis present

## 2023-10-06 DIAGNOSIS — I2510 Atherosclerotic heart disease of native coronary artery without angina pectoris: Secondary | ICD-10-CM | POA: Diagnosis present

## 2023-10-06 DIAGNOSIS — Z1152 Encounter for screening for COVID-19: Secondary | ICD-10-CM | POA: Diagnosis not present

## 2023-10-06 DIAGNOSIS — J440 Chronic obstructive pulmonary disease with acute lower respiratory infection: Secondary | ICD-10-CM | POA: Diagnosis present

## 2023-10-06 DIAGNOSIS — J441 Chronic obstructive pulmonary disease with (acute) exacerbation: Secondary | ICD-10-CM | POA: Diagnosis present

## 2023-10-06 DIAGNOSIS — Z79899 Other long term (current) drug therapy: Secondary | ICD-10-CM | POA: Diagnosis not present

## 2023-10-06 DIAGNOSIS — F17210 Nicotine dependence, cigarettes, uncomplicated: Secondary | ICD-10-CM | POA: Diagnosis present

## 2023-10-06 DIAGNOSIS — J9811 Atelectasis: Secondary | ICD-10-CM | POA: Diagnosis present

## 2023-10-06 DIAGNOSIS — J439 Emphysema, unspecified: Secondary | ICD-10-CM | POA: Diagnosis present

## 2023-10-06 DIAGNOSIS — W19XXXD Unspecified fall, subsequent encounter: Secondary | ICD-10-CM | POA: Diagnosis present

## 2023-10-06 DIAGNOSIS — Z9981 Dependence on supplemental oxygen: Secondary | ICD-10-CM | POA: Diagnosis not present

## 2023-10-06 DIAGNOSIS — E66813 Obesity, class 3: Secondary | ICD-10-CM | POA: Diagnosis present

## 2023-10-06 DIAGNOSIS — F419 Anxiety disorder, unspecified: Secondary | ICD-10-CM | POA: Diagnosis present

## 2023-10-06 DIAGNOSIS — I272 Pulmonary hypertension, unspecified: Secondary | ICD-10-CM | POA: Diagnosis present

## 2023-10-06 DIAGNOSIS — J9621 Acute and chronic respiratory failure with hypoxia: Secondary | ICD-10-CM | POA: Diagnosis present

## 2023-10-06 DIAGNOSIS — T380X5A Adverse effect of glucocorticoids and synthetic analogues, initial encounter: Secondary | ICD-10-CM | POA: Diagnosis present

## 2023-10-06 DIAGNOSIS — E039 Hypothyroidism, unspecified: Secondary | ICD-10-CM | POA: Diagnosis present

## 2023-10-06 DIAGNOSIS — Z66 Do not resuscitate: Secondary | ICD-10-CM | POA: Diagnosis present

## 2023-10-06 DIAGNOSIS — F319 Bipolar disorder, unspecified: Secondary | ICD-10-CM | POA: Diagnosis present

## 2023-10-06 DIAGNOSIS — Z955 Presence of coronary angioplasty implant and graft: Secondary | ICD-10-CM | POA: Diagnosis not present

## 2023-10-06 DIAGNOSIS — I10 Essential (primary) hypertension: Secondary | ICD-10-CM | POA: Diagnosis present

## 2023-10-06 DIAGNOSIS — Z79890 Hormone replacement therapy (postmenopausal): Secondary | ICD-10-CM | POA: Diagnosis not present

## 2023-10-06 LAB — COMPREHENSIVE METABOLIC PANEL
ALT: 44 U/L (ref 0–44)
AST: 25 U/L (ref 15–41)
Albumin: 3.1 g/dL — ABNORMAL LOW (ref 3.5–5.0)
Alkaline Phosphatase: 120 U/L (ref 38–126)
Anion gap: 9 (ref 5–15)
BUN: 20 mg/dL (ref 8–23)
CO2: 27 mmol/L (ref 22–32)
Calcium: 9.3 mg/dL (ref 8.9–10.3)
Chloride: 105 mmol/L (ref 98–111)
Creatinine, Ser: 0.8 mg/dL (ref 0.44–1.00)
GFR, Estimated: >60 mL/min (ref >60)
Glucose, Bld: 181 mg/dL — ABNORMAL HIGH (ref 70–99)
Potassium: 4.3 mmol/L (ref 3.5–5.1)
Sodium: 141 mmol/L (ref 135–145)
Total Bilirubin: 0.4 mg/dL (ref 0.0–1.2)
Total Protein: 5.9 g/dL — ABNORMAL LOW (ref 6.5–8.1)

## 2023-10-06 LAB — CBC
HCT: 36.1 % (ref 36.0–46.0)
Hemoglobin: 11.5 g/dL — ABNORMAL LOW (ref 12.0–15.0)
MCH: 31.6 pg (ref 26.0–34.0)
MCHC: 31.9 g/dL (ref 30.0–36.0)
MCV: 99.2 fL (ref 80.0–100.0)
Platelets: 581 10*3/uL — ABNORMAL HIGH (ref 150–400)
RBC: 3.64 MIL/uL — ABNORMAL LOW (ref 3.87–5.11)
RDW: 15.9 % — ABNORMAL HIGH (ref 11.5–15.5)
WBC: 14.3 10*3/uL — ABNORMAL HIGH (ref 4.0–10.5)
nRBC: 0 % (ref 0.0–0.2)

## 2023-10-06 LAB — GLUCOSE, CAPILLARY
Glucose-Capillary: 175 mg/dL — ABNORMAL HIGH (ref 70–99)
Glucose-Capillary: 118 mg/dL — ABNORMAL HIGH (ref 70–99)
Glucose-Capillary: 192 mg/dL — ABNORMAL HIGH (ref 70–99)

## 2023-10-06 MED ORDER — IPRATROPIUM-ALBUTEROL 0.5-2.5 (3) MG/3ML IN SOLN
3.0000 mL | Freq: Two times a day (BID) | RESPIRATORY_TRACT | Status: DC
  Administered 2023-10-06 – 2023-10-07 (×3): 3 mL via RESPIRATORY_TRACT
  Filled 2023-10-06 (×4): qty 3

## 2023-10-06 NOTE — Care Management Obs Status (Signed)
 MEDICARE OBSERVATION STATUS NOTIFICATION   Patient Details  Name: [name] [name] MRN: [id number] Date of Birth: 1956-04-10   Medicare Observation Status Notification Given:  Yes    Harriet Masson, RN 10/06/2023, 10:13 AM

## 2023-10-06 NOTE — Progress Notes (Addendum)
 PROGRESS NOTE    [name] [name]  YQM:[id number] DOB: 1955-08-10 DOA: 10/05/2023 PCP: Martina Sinner, MD    Brief Narrative:   [name] [name] is a 68 y.o. female with past medical history significant for chronic hypoxic respiratory failure, COPD on 2 L nasal cannula baseline, HTN, hypothyroidism, CAD, bipolar disorder, tobacco abuse disorder, prediabetes, iron deficiency anemia, and recent hospitalization for fall resulting in multiple right rib fractures and small pneumothorax and discharged to SNF on 09/23/2023 who presented to [facility] ED on 10/05/2023 via EMS with 2-day history of progressive shortness of breath, cough.  Typically on Trelegy Ellipta at baseline with scheduled DuoNebs but apparently DuoNebs were not continued at the facility.  Recently seen by her pulmonologist outpatient for posthospital follow-up on 3/17 and Trelegy dose was increased and given Z-Pak and prednisone taper for COPD exacerbation.  In the ED, temperature 97.5 F, HR 67, RR 12, BP 133/60, SpO2 98% on 2 L nasal cannula.  WBC 12.7, hemoglobin 11.9, platelet count 570.  Sodium 140, potassium 4.0, chloride 1 3, CO2 28, glucose 89, BUN 16, creatinine 0.77.  BNP 87.1.  COVID/influenza/RSV PCR negative.  Chest x-ray with ongoing small bilateral pleural effusions, no pneumothorax or other acute cardiopulmonary abnormality, healing right rib fractures.  CT angiogram chest negative for pulmonary embolism, resolution of right pneumothorax since previous study, multiple right lateral rib fractures with evidence of early healing, new small right pleural effusion and increased bilateral lower lobe atelectasis, right greater than left and mild emphysema.  TRH consulted by EDP for admission.  Assessment & Plan:   Acute on chronic hypoxemic respiratory failure COPD exacerbation Patient presenting with progressive cough, shortness of breath.  At baseline 2 L nasal cannula.  Continues to endorse tobacco use.  Recently seen  by her pulmonologist with increase of Trelegy; and given prednisone taper and Z-Pak for COPD exacerbation.  Patient is afebrile, slightly elevated WBC count likely secondary to steroid use outpatient.  COVID/RSV/influenza PCR negative.  Procalcitonin less than 0.10.  No focal consolidation to suggest pneumonia noted on imaging studies.  No PE on CT angiogram chest.  Etiology of her symptoms likely related to COPD exacerbation in the state of continued tobacco use. -- Pulmicort neb twice daily -- DuoNebs scheduled every 6 hours -- Solu-Medrol 40 mg IV every 12 hours -- Singulair 10 mg p.o. nightly -- Doxycycline 100 mg p.o. twice daily -- Albuterol neb every 2 hours as needed wheezing/shortness of breath -- Incentive spirometry  Recent fall with right-sided rib fractures and pneumothorax CT imaging notable for healing right rib fractures.  Previous pneumothorax has resolved. -- Supportive care, fall precautions -- PT/OT evaluation; from SNF  Essential hypertension -- Amlodipine 5 mg p.o. daily -- Losartan send family grams p.o. daily  CAD with prior PTCA -- Nebivolol 10 mg p.o. daily -- Atorvastatin 80 mg p.o. daily -- Zetia 10 mg p.o. nightly  Hypothyroidism -- Levothyroxine 112 mcg p.o. daily  Bipolar disorder Anxiety/depression -- Remeron 45 mg p.o. nightly -- BuSpar 30 mg p.o. twice daily -- Doxepin 75 mg p.o. nightly -- Lexapro 20 mg p.o. nightly -- Clonazepam 0.5 mg p.o. 3 times daily as needed anxiety  Iron deficiency anemia Hemoglobin 11.5, stable.  Tobacco use disorder Will need for complete cessation/abstinence. --Nicotine patch  Obesity, class III Body mass index is 35.19 kg/m.  Complicates all facets of care   DVT prophylaxis: enoxaparin (LOVENOX) injection 40 mg Start: 10/05/23 1230    Code Status: Limited: Do  not attempt resuscitation (DNR) -DNR-LIMITED -Do Not Intubate/DNI  Family Communication:   Disposition Plan:  Level of care: Med-Surg Status is:  Observation The patient remains OBS appropriate and will d/c before 2 midnights.    Consultants:  none  Procedures:  none  Antimicrobials:  Doxycycline 3/23>> Ceftriaxone 3/23 - 3/23   Subjective: Patient seen examined bedside, lying in bed.  Undergoing neb treatment, RT at bedside.  Reports dyspnea slightly improved today.  Continues with generalized weakness.  No other specific questions, concerns or complaints at this time.  Denies headache, no dizziness, no chest pain, no palpitations, no abdominal pain, no fever/chills/night sweats, no nausea/vomiting/diarrhea, no focal weakness, no fatigue, no paresthesias.  No acute events overnight per nursing staff.  Objective: Vitals:   10/06/23 0541 10/06/23 0605 10/06/23 0720 10/06/23 0846  BP: (!) 133/58 (!) 133/58 138/70   Pulse: 77  80   Resp: 19  16   Temp: 98.5 F (36.9 C)  98.8 F (37.1 C)   TempSrc: Oral  Oral   SpO2: 92%  91% 96%  Weight:      Height:        Intake/Output Summary (Last 24 hours) at 10/06/2023 1104 Last data filed at 10/05/2023 1217 Gross per 24 hour  Intake 200 ml  Output --  Net 200 ml   Filed Weights   10/05/23 0745  Weight: 73.8 kg    Examination:  Physical Exam: GEN: NAD, alert and oriented x 3, obese HEENT: NCAT, PERRL, EOMI, sclera clear, MMM PULM: Breath sounds diminished bilateral bases, + wheezing/rhonchi; normal respiratory effort without accessory muscle use, on 2 L nasal cannula with SpO2 92% which is at her baseline CV: RRR w/o M/G/R GI: abd soft, NTND, NABS, no R/G/M MSK: no peripheral edema, moves all extremities dependently NEURO: No focal neurological deficit PSYCH: normal mood/affect Integumentary: No concerning rashes/lesions/wounds noted on exposed skin surfaces.    Data Reviewed: I have personally reviewed following labs and imaging studies  CBC: Recent Labs  Lab 10/05/23 0749 10/06/23 0613  WBC 12.7* 14.3*  NEUTROABS 7.4  --   HGB 11.9* 11.5*  HCT 37.6 36.1   MCV 101.3* 99.2  PLT 570* 581*   Basic Metabolic Panel: Recent Labs  Lab 10/05/23 0749 10/06/23 0613  NA 140 141  K 4.0 4.3  CL 103 105  CO2 28 27  GLUCOSE 89 181*  BUN 16 20  CREATININE 0.77 0.80  CALCIUM 9.1 9.3   GFR: Estimated Creatinine Clearance: 56.8 mL/min (by C-G formula based on SCr of 0.8 mg/dL). Liver Function Tests: Recent Labs  Lab 10/06/23 0613  AST 25  ALT 44  ALKPHOS 120  BILITOT 0.4  PROT 5.9*  ALBUMIN 3.1*   No results for input(s): "LIPASE", "AMYLASE" in the last 168 hours. No results for input(s): "AMMONIA" in the last 168 hours. Coagulation Profile: No results for input(s): "INR", "PROTIME" in the last 168 hours. Cardiac Enzymes: No results for input(s): "CKTOTAL", "CKMB", "CKMBINDEX", "TROPONINI" in the last 168 hours. BNP (last 3 results) No results for input(s): "PROBNP" in the last 8760 hours. HbA1C: No results for input(s): "HGBA1C" in the last 72 hours. CBG: Recent Labs  Lab 10/05/23 1403 10/05/23 1608 10/05/23 2008 10/06/23 0720  GLUCAP 184* 189* 170* 175*   Lipid Profile: No results for input(s): "CHOL", "HDL", "LDLCALC", "TRIG", "CHOLHDL", "LDLDIRECT" in the last 72 hours. Thyroid Function Tests: No results for input(s): "TSH", "T4TOTAL", "FREET4", "T3FREE", "THYROIDAB" in the last 72 hours. Anemia Panel: No results  for input(s): "VITAMINB12", "FOLATE", "FERRITIN", "TIBC", "IRON", "RETICCTPCT" in the last 72 hours. Sepsis Labs: Recent Labs  Lab 10/05/23 1239  PROCALCITON <0.10    Recent Results (from the past 240 hours)  Resp panel by RT-PCR (RSV, Flu A&B, Covid) Anterior Nasal Swab     Status: None   Collection Time: 10/05/23  7:51 AM   Specimen: Anterior Nasal Swab  Result Value Ref Range Status   SARS Coronavirus 2 by RT PCR NEGATIVE NEGATIVE Final   Influenza A by PCR NEGATIVE NEGATIVE Final   Influenza B by PCR NEGATIVE NEGATIVE Final    Comment: (NOTE) The Xpert Xpress SARS-CoV-2/FLU/RSV plus assay is  intended as an aid in the diagnosis of influenza from Nasopharyngeal swab specimens and should not be used as a sole basis for treatment. Nasal washings and aspirates are unacceptable for Xpert Xpress SARS-CoV-2/FLU/RSV testing.  Fact Sheet for Patients: BloggerCourse.com  Fact Sheet for Healthcare Providers: SeriousBroker.it  This test is not yet approved or cleared by the [place] FDA and has been authorized for detection and/or diagnosis of SARS-CoV-2 by FDA under an Emergency Use Authorization (EUA). This EUA will remain in effect (meaning this test can be used) for the duration of the COVID-19 declaration under Section 564(b)(1) of the Act, 21 U.S.C. section 360bbb-3(b)(1), unless the authorization is terminated or revoked.     Resp Syncytial Virus by PCR NEGATIVE NEGATIVE Final    Comment: (NOTE) Fact Sheet for Patients: BloggerCourse.com  Fact Sheet for Healthcare Providers: SeriousBroker.it  This test is not yet approved or cleared by the [place] FDA and has been authorized for detection and/or diagnosis of SARS-CoV-2 by FDA under an Emergency Use Authorization (EUA). This EUA will remain in effect (meaning this test can be used) for the duration of the COVID-19 declaration under Section 564(b)(1) of the Act, 21 U.S.C. section 360bbb-3(b)(1), unless the authorization is terminated or revoked.  Performed at [facility], [address] [place], [place] [address]          Radiology Studies: CT Angio Chest Pulmonary Embolism (PE) W or WO Contrast Result Date: 10/05/2023 CLINICAL DATA:  Chest pain and shortness of breath. Positive D-dimer. EXAM: CT ANGIOGRAPHY CHEST WITH CONTRAST TECHNIQUE: Multidetector CT imaging of the chest was performed using the standard protocol during bolus administration of intravenous contrast. Multiplanar CT image  reconstructions and MIPs were obtained to evaluate the vascular anatomy. RADIATION DOSE REDUCTION: This exam was performed according to the departmental dose-optimization program which includes automated exposure control, adjustment of the mA and/or kV according to patient size and/or use of iterative reconstruction technique. CONTRAST:  61mL OMNIPAQUE IOHEXOL 350 MG/ML SOLN COMPARISON:  09/16/2023 FINDINGS: Cardiovascular: Satisfactory opacification of pulmonary arteries noted, and no pulmonary emboli identified. No evidence of thoracic aortic dissection or aneurysm. Mediastinum/Nodes: No masses or pathologically enlarged lymph nodes identified. Lungs/Pleura: There has been resolution of right pneumothorax since previous study. New small right pleural effusion is seen, with a small loculated portion seen in the lateral right upper lobe. Increased bilateral lower lobe atelectasis is seen, right side greater than left. Mild centrilobular emphysema again noted. Upper abdomen: No acute findings. Musculoskeletal: Multiple right lateral rib fractures are again seen, with No evidence of early healing since prior exam. No acute fractures or other suspicious bone lesions identified. Review of the MIP images confirms the above findings. IMPRESSION: No evidence of pulmonary embolism. Resolution of right pneumothorax since previous study. Multiple right lateral rib fractures, with evidence of early  healing since prior exam. New small right pleural effusion, and increased bilateral lower lobe atelectasis, right side greater than left. Mild emphysema. Electronically Signed   By: Danae Orleans M.D.   On: 10/05/2023 13:08   DG Chest 2 View Result Date: 10/05/2023 CLINICAL DATA:  68 year old female status post fall with right side rib fractures and pneumothorax on 09/16/2023. Shortness of breath. EXAM: CHEST - 2 VIEW COMPARISON:  Chest radiographs 09/29/2023 and earlier. FINDINGS: PA and lateral views 0806 hours. Mildly lower  lung volumes. Mediastinal contours remain normal. Bilateral pleural effusions persist, not significantly changed. No pneumothorax. No confluent lung opacity. Osteopenia. Healing right rib fractures are less apparent. No new osseous abnormality identified. Negative visible bowel gas. IMPRESSION: 1. Ongoing small bilateral pleural effusions. No pneumothorax or other acute cardiopulmonary abnormality. 2. Healing right rib fractures. Electronically Signed   By: Odessa Fleming M.D.   On: 10/05/2023 08:24        Scheduled Meds:  amLODipine  5 mg Oral q AM   atorvastatin  80 mg Oral q AM   budesonide (PULMICORT) nebulizer solution  0.25 mg Nebulization BID   busPIRone  30 mg Oral BID   doxepin  75 mg Oral QHS   doxycycline  100 mg Oral Q12H   enoxaparin (LOVENOX) injection  40 mg Subcutaneous Q24H   escitalopram  20 mg Oral QHS   ezetimibe  10 mg Oral QHS   furosemide  20 mg Oral q AM   gabapentin  600 mg Oral BID   insulin aspart  0-6 Units Subcutaneous TID WC   ipratropium-albuterol  3 mL Nebulization Q6H   levothyroxine  112 mcg Oral QAC breakfast   losartan  75 mg Oral q AM   methylPREDNISolone (SOLU-MEDROL) injection  40 mg Intravenous Q12H   mirtazapine  45 mg Oral QHS   montelukast  10 mg Oral QHS   nebivolol  10 mg Oral QHS   nicotine  14 mg Transdermal Daily   pantoprazole  40 mg Oral Daily   sodium chloride flush  3 mL Intravenous Q12H   Continuous Infusions:   LOS: 0 days    Time spent: 50 minutes spent on 10/06/2023 caring for this patient face-to-face including chart review, ordering labs/tests, documenting, discussion with nursing staff, consultants, updating family and interview/physical exam    Alvira Philips [place], DO [facility] Available via Epic secure chat 7am-7pm After these hours, please refer to coverage provider listed on [email or web address] 10/06/2023, 11:04 AM

## 2023-10-06 NOTE — Evaluation (Signed)
 Occupational Therapy Evaluation and Discharge Summary Patient Details Name: [name] [name] MRN: [id number] DOB: June 16, 1956 Today's Date: 10/06/2023   History of Present Illness   Pt is a 68 yo presenting to [facility] ED on 3/23 due to cough, shortness of breathe and symptoms similarly to her prior presentations with pneumonia. Prior admits on 09/16/23 after falling out of bed with R sided rib fxs 4-9 and R PTX and 08/31/23 w/ acute COPD exacerbation and Aspiration PNA. PMH: HTN, CAD with multiple DES, DM2, COPD, anxiety, hypothyroidism, RTC repair 12/24/2021, ongoing tobacco abuse.     Clinical Impressions PT admitted with the above diagnosis and has the few deficits outlined below. Pt is at or very close to baseline outside of being slightly SOB during all tasks. Pt can complete all basic adls in room without assist. Pt was educated regarding energy conservation techniques and was given a handout. Pt was on 2L O2 during session (same as at home) and kept O2 sats at 97%. Reviewed pursed lip breathing techniques.  Feel pt will be safe to return home with daughter checking in.  No further acute OT needs at this time.  Recommend a longer O2 tube for room so pt can walk to bathroom without taking off O2. No further acute OT needs at this time.     If plan is discharge home, recommend the following:   Assistance with cooking/housework;Assist for transportation     Functional Status Assessment   Patient has not had a recent decline in their functional status     Equipment Recommendations   None recommended by OT     Recommendations for Other Services         Precautions/Restrictions   Precautions Precautions: Other (comment) (SOB with walking. Check O2 sats) Recall of Precautions/Restrictions: Intact Precaution/Restrictions Comments: watch SpO2 Restrictions Weight Bearing Restrictions Per Provider Order: No     Mobility Bed Mobility Overal bed mobility: Modified Independent              General bed mobility comments: No assist needed    Transfers Overall transfer level: Modified independent Equipment used: Rolling walker (2 wheels)               General transfer comment: No assist needed      Balance Overall balance assessment: Modified Independent   Sitting balance-Leahy Scale: Normal Sitting balance - Comments: EOB   Standing balance support: During functional activity, Reliant on assistive device for balance, Bilateral upper extremity supported, No upper extremity supported Standing balance-Leahy Scale: Good Standing balance comment: static standing pt does not require UE support, dynamic activities requires bil UE support for balance.                           ADL either performed or assessed with clinical judgement   ADL Overall ADL's : At baseline                                       General ADL Comments: Pt is overall at baseline with all adls but is mildly more SOB than normal.  Pt completes all adls with rollator and 2L of O2. Pt walked to bathroom to toilet, dressed, groomed and fed self with no physical assist. Pt does get SOB. Reviewed all energy conservation techinques and will leave pt a packet to review. Discussed pursed lip breathing and O2  sats at 97% throughout session on 2L of O2 which she uses at home.     Vision Baseline Vision/History: 0 No visual deficits Ability to See in Adequate Light: 0 Adequate Patient Visual Report: No change from baseline Vision Assessment?: No apparent visual deficits     Perception Perception: Within Functional Limits       Praxis Praxis: WFL       Pertinent Vitals/Pain Pain Assessment Pain Assessment: Faces Faces Pain Scale: Hurts little more Pain Location: R ribs and back Pain Descriptors / Indicators: Aching, Discomfort Pain Intervention(s): Monitored during session     Extremity/Trunk Assessment Upper Extremity Assessment Upper Extremity Assessment:  Overall WFL for tasks assessed   Lower Extremity Assessment Lower Extremity Assessment: Defer to PT evaluation   Cervical / Trunk Assessment Cervical / Trunk Assessment: Normal   Communication Communication Communication: No apparent difficulties   Cognition Arousal: Alert Behavior During Therapy: WFL for tasks assessed/performed Cognition: No apparent impairments             OT - Cognition Comments: pleasant and oriented                 Following commands: Intact       Cueing  General Comments   Cueing Techniques: Verbal cues  Pt at baseline outside of being slightly more SOB during all activities.   Exercises     Shoulder Instructions      Home Living Family/patient expects to be discharged to:: Private residence Living Arrangements: Alone Available Help at Discharge: Family;Available PRN/intermittently Type of Home: Apartment Home Access: Stairs to enter Entrance Stairs-Number of Steps: 2 Entrance Stairs-Rails: Left Home Layout: One level     Bathroom Shower/Tub: Producer, television/film/video: Standard Bathroom Accessibility: Yes How Accessible: Accessible via walker Home Equipment: Rolling Walker (2 wheels);Shower seat;Hand held shower head;Grab bars - tub/shower;Rollator (4 wheels)          Prior Functioning/Environment Prior Level of Function : Independent/Modified Independent             Mobility Comments: uses rollator ADLs Comments: does not drive. Mod I with ADLs. Daughter takes her to store and she rides bus to doctor appts.    OT Problem List: Other (comment);Decreased activity tolerance (SOB)   OT Treatment/Interventions:        OT Goals(Current goals can be found in the care plan section)   Acute Rehab OT Goals Patient Stated Goal: to breath better OT Goal Formulation: All assessment and education complete, DC therapy   OT Frequency:       Co-evaluation              AM-PAC OT "6 Clicks" Daily Activity      Outcome Measure Help from another person eating meals?: None Help from another person taking care of personal grooming?: None Help from another person toileting, which includes using toliet, bedpan, or urinal?: None Help from another person bathing (including washing, rinsing, drying)?: None Help from another person to put on and taking off regular upper body clothing?: None Help from another person to put on and taking off regular lower body clothing?: None 6 Click Score: 24   End of Session Equipment Utilized During Treatment: Rolling walker (2 wheels);Oxygen Nurse Communication: Mobility status  Activity Tolerance: Patient tolerated treatment well Patient left: in bed;with call bell/phone within reach  OT Visit Diagnosis: Unsteadiness on feet (R26.81)                Time: 1610-9604  OT Time Calculation (min): 33 min Charges:  OT General Charges $OT Visit: 1 Visit OT Evaluation $OT Eval Low Complexity: 1 Low OT Treatments $Self Care/Home Management : 8-22 mins  Hope Budds 10/06/2023, 8:56 AM

## 2023-10-06 NOTE — Progress Notes (Signed)
 Mobility Specialist Progress Note:   10/06/23 1556  Mobility  Activity Transferred from bed to chair  Level of Assistance Standby assist, set-up cues, supervision of patient - no hands on  Assistive Device None  Mobility Referral Yes  Mobility visit 1 Mobility  Mobility Specialist Start Time (ACUTE ONLY) 1535  Mobility Specialist Stop Time (ACUTE ONLY) 1545  Mobility Specialist Time Calculation (min) (ACUTE ONLY) 10 min   Pt received in bed, declining ambulation but agreeable to transfer to chair. No physical assist needed during transfer. VSS on 2 L. Pt left in chair with call bell in reach and all needs met.   Leory Plowman  Mobility Specialist Please contact via [employer] office at [phone]

## 2023-10-06 NOTE — TOC Initial Note (Addendum)
 Transition of Care [facility]) - Initial/Assessment Note    Patient Details  Name: [name] [name] MRN: [id number] Date of Birth: [birth year]  Transition of Care [facility]) CM/SW Contact:    Marliss Coots, LCSW Phone Number: 10/06/2023, 2:53 PM  Clinical Narrative:                  2:53 PM Per chart review and progressions, patient resided at ALF prior to admitting to [facility] SNF from most recent hospitalization and expressed to return to SNF upon current hospitalization; however, physical therapy did not have any recommendations at this time. CSW relayed information to medical team. Patient has SDOH needs (housing).    Barriers to Discharge: Continued Medical Work up   Patient Goals and CMS Choice            Expected Discharge Plan and Services In-house Referral: Clinical Social Work Discharge Planning Services: CM Consult Post Acute Care Choice: Skilled Nursing Facility Living arrangements for the past 2 months: Apartment, Skilled Nursing Facility                                      Prior Living Arrangements/Services Living arrangements for the past 2 months: Apartment, Skilled Nursing Facility Lives with:: Self, Facility Resident Patient language and need for interpreter reviewed:: Yes        Need for Family Participation in Patient Care: No (Comment) Care giver support system in place?: No (comment)   Criminal Activity/Legal Involvement Pertinent to Current Situation/Hospitalization: No - Comment as needed  Activities of Daily Living   ADL Screening (condition at time of admission) Independently performs ADLs?: Yes (appropriate for developmental age) Does the patient have a NEW difficulty with bathing/dressing/toileting/self-feeding that is expected to last >3 days?: No Does the patient have a NEW difficulty with getting in/out of bed, walking, or climbing stairs that is expected to last >3 days?: No Does the patient have a NEW difficulty with communication  that is expected to last >3 days?: No Is the patient deaf or have difficulty hearing?: No Does the patient have difficulty seeing, even when wearing glasses/contacts?: No Does the patient have difficulty concentrating, remembering, or making decisions?: No  Permission Sought/Granted Permission sought to share information with : Family Supports Permission granted to share information with : No (Contact information on chart)  Share Information with NAME: Tiffiney Bowne  Permission granted to share info w AGENCY: [facility]  Permission granted to share info w Relationship: Daughter  Permission granted to share info w Contact Information: [phone]  Emotional Assessment       Orientation: : Oriented to Self, Oriented to Place, Oriented to  Time, Oriented to Situation Alcohol / Substance Use: Not Applicable Psych Involvement: No (comment)  Admission diagnosis:  COPD exacerbation (HCC) [J44.1] Acute on chronic hypoxic respiratory failure (HCC) [J96.21] Patient Active Problem List   Diagnosis Date Noted   Acute on chronic hypoxic respiratory failure (HCC) 10/05/2023   Pneumothorax, right 09/16/2023   COPD with acute exacerbation (HCC) 09/01/2023   Chronic hypoxic respiratory failure (HCC) 09/01/2023   Continuous dependence on cigarette smoking 09/01/2023   History of CAD (coronary artery disease) 09/01/2023   CAP (community acquired pneumonia) 09/01/2023   Infection due to parainfluenza virus 4 06/20/2023   Full code status 06/14/2023   Acute hypoxic respiratory failure (HCC) 06/13/2023   Acute exacerbation of chronic obstructive pulmonary disease (COPD) (HCC) 06/12/2023  Nicotine dependence 05/03/2023   Substance abuse (HCC) 09/10/2022   Iron deficiency anemia 11/02/2021   Major depressive disorder, recurrent, in full remission with anxious distress (HCC) 05/31/2021   Chronic neck pain 02/26/2021   GAD (generalized anxiety disorder) 01/08/2021   Esophageal dysphagia  04/28/2019   Bipolar I disorder (HCC) 09/17/2018   Insomnia 10/28/2017   Prediabetes 05/28/2017   QT prolongation 03/25/2017   Psoriasis 06/22/2015   COPD (chronic obstructive pulmonary disease) (HCC)GOLD E 05/18/2015   Anxiety and depression 07/10/2013   Essential hypertension 06/07/2013   Hyperlipidemia 06/07/2013   Asthma, chronic 06/07/2013   Emphysema lung (HCC) 06/07/2013   Chronic back pain 06/07/2013   CAD S/P percutaneous coronary angioplasty 06/07/2013   GERD (gastroesophageal reflux disease) 06/07/2013   Breast lump on left side at 3 o'clock position 10/13/2012   S/P abdominal hysterectomy and right salpingo-oophorectomy 08/29/2011   COPD exacerbation (HCC) 09/15/2009   TOBACCO ABUSE 07/17/2009   Chronic rhinitis 07/17/2009   Lung nodule < 6cm on CT 07/17/2009   Hypothyroidism 12/22/2008   Chronic depression 12/22/2008   PCP:  Martina Sinner, MD Pharmacy:   [facility] [address] [place] [address] Phone: [phone] Fax: [phone]  [facility] - [place], [place] - [address] [place] [address] Phone: [phone] Fax: [phone]  [name] [address] [place] [address] Phone: [phone] Fax: [phone]  [facility] - [place], [place] - [address] [place] [address] Phone: [phone] Fax: [phone]  [facility] [address] [place] [address] Phone: [phone] Fax: [phone]     Social Drivers of Health (SDOH) Social History: SDOH Screenings   Food Insecurity: No Food Insecurity (10/05/2023)  Recent Concern: Food Insecurity - Food Insecurity Present (08/07/2023)  Housing: High Risk (10/05/2023)  Transportation Needs: No Transportation Needs (10/05/2023)  Utilities: Not At Risk (10/05/2023)  Alcohol  Screen: Low Risk  (08/07/2023)  Depression (PHQ2-9): Low Risk  (09/26/2023)  Recent Concern: Depression (PHQ2-9) - Medium Risk (08/07/2023)  Financial Resource Strain: Medium Risk (08/07/2023)  Physical Activity: Inactive (08/07/2023)  Social Connections: Unknown (10/05/2023)  Recent Concern: Social Connections - Moderately Isolated (09/16/2023)  Stress: No Stress Concern Present (08/07/2023)  Tobacco Use: High Risk (10/05/2023)  Health Literacy: Inadequate Health Literacy (08/07/2023)   SDOH Interventions:     Readmission Risk Interventions    06/20/2023    3:17 PM  Readmission Risk Prevention Plan  Transportation Screening Complete  HRI or Home Care Consult Complete  Social Work Consult for Recovery Care Planning/Counseling Complete  Palliative Care Screening Not Applicable  Medication Review Oceanographer) Complete

## 2023-10-07 DIAGNOSIS — J9621 Acute and chronic respiratory failure with hypoxia: Secondary | ICD-10-CM | POA: Diagnosis not present

## 2023-10-07 LAB — GLUCOSE, CAPILLARY
Glucose-Capillary: 172 mg/dL — ABNORMAL HIGH (ref 70–99)
Glucose-Capillary: 156 mg/dL — ABNORMAL HIGH (ref 70–99)
Glucose-Capillary: 188 mg/dL — ABNORMAL HIGH (ref 70–99)
Glucose-Capillary: 138 mg/dL — ABNORMAL HIGH (ref 70–99)

## 2023-10-07 MED ORDER — PREDNISONE 10 MG PO TABS
40.0000 mg | ORAL_TABLET | Freq: Every day | ORAL | Status: DC
  Administered 2023-10-08: 40 mg via ORAL
  Filled 2023-10-07: qty 4

## 2023-10-07 MED ORDER — GUAIFENESIN ER 600 MG PO TB12
600.0000 mg | ORAL_TABLET | Freq: Two times a day (BID) | ORAL | Status: DC
  Administered 2023-10-07 – 2023-10-08 (×3): 600 mg via ORAL
  Filled 2023-10-07 (×3): qty 1

## 2023-10-07 NOTE — Progress Notes (Signed)
 PROGRESS NOTE    [name] [name]  BJY:[id number] DOB: 23-Nov-1955 DOA: 10/05/2023 PCP: Martina Sinner, MD    Brief Narrative:   [name] [name] is a 68 y.o. female with past medical history significant for chronic hypoxic respiratory failure, COPD on 2 L nasal cannula baseline, HTN, hypothyroidism, CAD, bipolar disorder, tobacco abuse disorder, prediabetes, iron deficiency anemia, and recent hospitalization for fall resulting in multiple right rib fractures and small pneumothorax and discharged to SNF on 09/23/2023 who presented to [facility] ED on 10/05/2023 via EMS with 2-day history of progressive shortness of breath, cough.  Typically on Trelegy Ellipta at baseline with scheduled DuoNebs but apparently DuoNebs were not continued at the facility.  Recently seen by her pulmonologist outpatient for posthospital follow-up on 3/17 and Trelegy dose was increased and given Z-Pak and prednisone taper for COPD exacerbation.  In the ED, temperature 97.5 F, HR 67, RR 12, BP 133/60, SpO2 98% on 2 L nasal cannula.  WBC 12.7, hemoglobin 11.9, platelet count 570.  Sodium 140, potassium 4.0, chloride 1 3, CO2 28, glucose 89, BUN 16, creatinine 0.77.  BNP 87.1.  COVID/influenza/RSV PCR negative.  Chest x-ray with ongoing small bilateral pleural effusions, no pneumothorax or other acute cardiopulmonary abnormality, healing right rib fractures.  CT angiogram chest negative for pulmonary embolism, resolution of right pneumothorax since previous study, multiple right lateral rib fractures with evidence of early healing, new small right pleural effusion and increased bilateral lower lobe atelectasis, right greater than left and mild emphysema.  TRH consulted by EDP for admission.  Assessment & Plan:   Acute on chronic hypoxemic respiratory failure COPD exacerbation Patient presenting with progressive cough, shortness of breath.  At baseline 2 L nasal cannula.  Continues to endorse tobacco use.  Recently seen  by her pulmonologist with increase of Trelegy; and given prednisone taper and Z-Pak for COPD exacerbation.  Patient is afebrile, slightly elevated WBC count likely secondary to steroid use outpatient.  COVID/RSV/influenza PCR negative.  Procalcitonin less than 0.10.  No focal consolidation to suggest pneumonia noted on imaging studies.  No PE on CT angiogram chest.  Etiology of her symptoms likely related to COPD exacerbation in the state of continued tobacco use. -- Pulmicort neb twice daily -- DuoNebs scheduled every 6 hours -- Solu-Medrol 40 mg IV every 12 hours; transition to prednisone 40 mg p.o. daily tomorrow -- Singulair 10 mg p.o. nightly -- Doxycycline 100 mg p.o. twice daily; plan 7 day course -- Mucinex 600 mg p.o. twice daily -- Albuterol neb every 2 hours as needed wheezing/shortness of breath -- Incentive spirometry  Recent fall with right-sided rib fractures and pneumothorax CT imaging notable for healing right rib fractures.  Previous pneumothorax has resolved. -- Supportive care, fall precautions -- PT/OT evaluation recommend return home with no follow-up -- Continue mobilization with mobility tech while inpatient  Essential hypertension -- Amlodipine 5 mg p.o. daily -- Losartan 75 mg p.o. daily  CAD with prior PTCA -- Nebivolol 10 mg p.o. daily -- Atorvastatin 80 mg p.o. daily -- Zetia 10 mg p.o. nightly  Hypothyroidism -- Levothyroxine 112 mcg p.o. daily  Bipolar disorder Anxiety/depression -- Remeron 45 mg p.o. nightly -- BuSpar 30 mg p.o. twice daily -- Doxepin 75 mg p.o. nightly -- Lexapro 20 mg p.o. nightly -- Clonazepam 0.5 mg p.o. 3 times daily as needed anxiety  Iron deficiency anemia Hemoglobin 11.5, stable.  Tobacco use disorder Counseled on need for complete cessation/abstinence. --Nicotine patch  Obesity, class III  Body mass index is 35.19 kg/m.  Complicates all facets of care   DVT prophylaxis: enoxaparin (LOVENOX) injection 40 mg Start:  10/05/23 1230    Code Status: Limited: Do not attempt resuscitation (DNR) -DNR-LIMITED -Do Not Intubate/DNI  Family Communication:   Disposition Plan:  Level of care: Med-Surg Status is: Inpatient Remains inpatient appropriate because: IV steroids, transition to prednisone tomorrow, anticipate discharge home on 3/26   Consultants:  none  Procedures:  none  Antimicrobials:  Doxycycline 3/23>> Ceftriaxone 3/23 - 3/23   Subjective: Patient seen examined bedside, lying in bed.  Continues with mild shortness of breath although improved.  Continues with nonproductive cough as well.  Seen by PT and OT who have signed off with no needs identified and plan to return home after hospitalization. No other specific questions, concerns or complaints at this time.  Denies headache, no dizziness, no chest pain, no palpitations, no abdominal pain, no fever/chills/night sweats, no nausea/vomiting/diarrhea, no focal weakness, no fatigue, no paresthesias.  No acute events overnight per nursing staff.  Objective: Vitals:   10/06/23 2101 10/07/23 0555 10/07/23 0757 10/07/23 0916  BP: (!) 126/58 137/60 (!) 142/66   Pulse: 70 (!) 58    Resp: 18 16    Temp: 98.8 F (37.1 C) 98.5 F (36.9 C) 98 F (36.7 C)   TempSrc: Oral Oral    SpO2: 96% 99% 97% 98%  Weight:      Height:        Intake/Output Summary (Last 24 hours) at 10/07/2023 1103 Last data filed at 10/06/2023 2036 Gross per 24 hour  Intake 200 ml  Output --  Net 200 ml   Filed Weights   10/05/23 0745  Weight: 73.8 kg    Examination:  Physical Exam: GEN: NAD, alert and oriented x 3, obese HEENT: NCAT, PERRL, EOMI, sclera clear, MMM PULM: Mild late expiratory wheezing bilaterally; normal respiratory effort without accessory muscle use, on 2 L nasal cannula with SpO2 99% which is at her baseline CV: RRR w/o M/G/R GI: abd soft, NTND, NABS, no R/G/M MSK: no peripheral edema, moves all extremities dependently NEURO: No focal  neurological deficit PSYCH: normal mood/affect Integumentary: No concerning rashes/lesions/wounds noted on exposed skin surfaces.    Data Reviewed: I have personally reviewed following labs and imaging studies  CBC: Recent Labs  Lab 10/05/23 0749 10/06/23 0613  WBC 12.7* 14.3*  NEUTROABS 7.4  --   HGB 11.9* 11.5*  HCT 37.6 36.1  MCV 101.3* 99.2  PLT 570* 581*   Basic Metabolic Panel: Recent Labs  Lab 10/05/23 0749 10/06/23 0613  NA 140 141  K 4.0 4.3  CL 103 105  CO2 28 27  GLUCOSE 89 181*  BUN 16 20  CREATININE 0.77 0.80  CALCIUM 9.1 9.3   GFR: Estimated Creatinine Clearance: 56.8 mL/min (by C-G formula based on SCr of 0.8 mg/dL). Liver Function Tests: Recent Labs  Lab 10/06/23 0613  AST 25  ALT 44  ALKPHOS 120  BILITOT 0.4  PROT 5.9*  ALBUMIN 3.1*   No results for input(s): "LIPASE", "AMYLASE" in the last 168 hours. No results for input(s): "AMMONIA" in the last 168 hours. Coagulation Profile: No results for input(s): "INR", "PROTIME" in the last 168 hours. Cardiac Enzymes: No results for input(s): "CKTOTAL", "CKMB", "CKMBINDEX", "TROPONINI" in the last 168 hours. BNP (last 3 results) No results for input(s): "PROBNP" in the last 8760 hours. HbA1C: No results for input(s): "HGBA1C" in the last 72 hours. CBG: Recent Labs  Lab  10/05/23 2008 10/06/23 0720 10/06/23 1116 10/06/23 1602 10/07/23 0756  GLUCAP 170* 175* 118* 192* 172*   Lipid Profile: No results for input(s): "CHOL", "HDL", "LDLCALC", "TRIG", "CHOLHDL", "LDLDIRECT" in the last 72 hours. Thyroid Function Tests: No results for input(s): "TSH", "T4TOTAL", "FREET4", "T3FREE", "THYROIDAB" in the last 72 hours. Anemia Panel: No results for input(s): "VITAMINB12", "FOLATE", "FERRITIN", "TIBC", "IRON", "RETICCTPCT" in the last 72 hours. Sepsis Labs: Recent Labs  Lab 10/05/23 1239  PROCALCITON <0.10    Recent Results (from the past 240 hours)  Resp panel by RT-PCR (RSV, Flu A&B, Covid)  Anterior Nasal Swab     Status: None   Collection Time: 10/05/23  7:51 AM   Specimen: Anterior Nasal Swab  Result Value Ref Range Status   SARS Coronavirus 2 by RT PCR NEGATIVE NEGATIVE Final   Influenza A by PCR NEGATIVE NEGATIVE Final   Influenza B by PCR NEGATIVE NEGATIVE Final    Comment: (NOTE) The Xpert Xpress SARS-CoV-2/FLU/RSV plus assay is intended as an aid in the diagnosis of influenza from Nasopharyngeal swab specimens and should not be used as a sole basis for treatment. Nasal washings and aspirates are unacceptable for Xpert Xpress SARS-CoV-2/FLU/RSV testing.  Fact Sheet for Patients: BloggerCourse.com  Fact Sheet for Healthcare Providers: SeriousBroker.it  This test is not yet approved or cleared by the [place] FDA and has been authorized for detection and/or diagnosis of SARS-CoV-2 by FDA under an Emergency Use Authorization (EUA). This EUA will remain in effect (meaning this test can be used) for the duration of the COVID-19 declaration under Section 564(b)(1) of the Act, 21 U.S.C. section 360bbb-3(b)(1), unless the authorization is terminated or revoked.     Resp Syncytial Virus by PCR NEGATIVE NEGATIVE Final    Comment: (NOTE) Fact Sheet for Patients: BloggerCourse.com  Fact Sheet for Healthcare Providers: SeriousBroker.it  This test is not yet approved or cleared by the [place] FDA and has been authorized for detection and/or diagnosis of SARS-CoV-2 by FDA under an Emergency Use Authorization (EUA). This EUA will remain in effect (meaning this test can be used) for the duration of the COVID-19 declaration under Section 564(b)(1) of the Act, 21 U.S.C. section 360bbb-3(b)(1), unless the authorization is terminated or revoked.  Performed at [facility], [address] [place], [place] [address]          Radiology Studies: CT  Angio Chest Pulmonary Embolism (PE) W or WO Contrast Result Date: 10/05/2023 CLINICAL DATA:  Chest pain and shortness of breath. Positive D-dimer. EXAM: CT ANGIOGRAPHY CHEST WITH CONTRAST TECHNIQUE: Multidetector CT imaging of the chest was performed using the standard protocol during bolus administration of intravenous contrast. Multiplanar CT image reconstructions and MIPs were obtained to evaluate the vascular anatomy. RADIATION DOSE REDUCTION: This exam was performed according to the departmental dose-optimization program which includes automated exposure control, adjustment of the mA and/or kV according to patient size and/or use of iterative reconstruction technique. CONTRAST:  61mL OMNIPAQUE IOHEXOL 350 MG/ML SOLN COMPARISON:  09/16/2023 FINDINGS: Cardiovascular: Satisfactory opacification of pulmonary arteries noted, and no pulmonary emboli identified. No evidence of thoracic aortic dissection or aneurysm. Mediastinum/Nodes: No masses or pathologically enlarged lymph nodes identified. Lungs/Pleura: There has been resolution of right pneumothorax since previous study. New small right pleural effusion is seen, with a small loculated portion seen in the lateral right upper lobe. Increased bilateral lower lobe atelectasis is seen, right side greater than left. Mild centrilobular emphysema again noted. Upper abdomen: No acute findings. Musculoskeletal:  Multiple right lateral rib fractures are again seen, with No evidence of early healing since prior exam. No acute fractures or other suspicious bone lesions identified. Review of the MIP images confirms the above findings. IMPRESSION: No evidence of pulmonary embolism. Resolution of right pneumothorax since previous study. Multiple right lateral rib fractures, with evidence of early healing since prior exam. New small right pleural effusion, and increased bilateral lower lobe atelectasis, right side greater than left. Mild emphysema. Electronically Signed   By:  Danae Orleans M.D.   On: 10/05/2023 13:08        Scheduled Meds:  amLODipine  5 mg Oral q AM   atorvastatin  80 mg Oral q AM   budesonide (PULMICORT) nebulizer solution  0.25 mg Nebulization BID   busPIRone  30 mg Oral BID   doxepin  75 mg Oral QHS   doxycycline  100 mg Oral Q12H   enoxaparin (LOVENOX) injection  40 mg Subcutaneous Q24H   escitalopram  20 mg Oral QHS   ezetimibe  10 mg Oral QHS   furosemide  20 mg Oral q AM   gabapentin  600 mg Oral BID   guaiFENesin  600 mg Oral BID   insulin aspart  0-6 Units Subcutaneous TID WC   ipratropium-albuterol  3 mL Nebulization BID   levothyroxine  112 mcg Oral QAC breakfast   losartan  75 mg Oral q AM   methylPREDNISolone (SOLU-MEDROL) injection  40 mg Intravenous Q12H   mirtazapine  45 mg Oral QHS   montelukast  10 mg Oral QHS   nebivolol  10 mg Oral QHS   nicotine  14 mg Transdermal Daily   pantoprazole  40 mg Oral Daily   [START ON 10/08/2023] predniSONE  40 mg Oral Q breakfast   sodium chloride flush  3 mL Intravenous Q12H   Continuous Infusions:   LOS: 1 day    Time spent: 45 minutes spent on 10/07/2023 caring for this patient face-to-face including chart review, ordering labs/tests, documenting, discussion with nursing staff, consultants, updating family and interview/physical exam    Alvira Philips [place], DO [facility] Available via Epic secure chat 7am-7pm After these hours, please refer to coverage provider listed on [email or web address] 10/07/2023, 11:03 AM

## 2023-10-07 NOTE — Plan of Care (Signed)

## 2023-10-07 NOTE — Progress Notes (Signed)
 PT Cancellation Note and Discharge  Patient Details Name: [name] [name] MRN: [id number] DOB: Apr 02, 1956   Cancelled Treatment:    Reason Eval/Treat Not Completed: PT screened, no needs identified, will sign off. New PT eval received, chart reviewed. It appears pt is at baseline of function and is mobilizing at a modified independent level. Please see PT evaluation from 3/23 for more detail. Pt will continue to be followed by mobility specialists even though PT is signing off.    Marylynn Pearson 10/07/2023, 8:40 AM  Conni Slipper, PT, DPT Acute Rehabilitation Services Secure Chat Preferred Office: [phone]

## 2023-10-07 NOTE — Plan of Care (Signed)
  Problem: Coping: Goal: Ability to adjust to condition or change in health will improve Outcome: Not Progressing   Problem: Skin Integrity: Goal: Risk for impaired skin integrity will decrease Outcome: Not Progressing   Problem: Clinical Measurements: Goal: Will remain free from infection Outcome: Not Progressing   Problem: Clinical Measurements: Goal: Respiratory complications will improve Outcome: Not Progressing

## 2023-10-07 NOTE — Discharge Instructions (Signed)
 Rent/Utility Assistance in [facility]:  INNOVATIVE PATHWAYS [address], [place], [place] [address] [phone] Mon 8:00am - 6:00pm; Tue 8:00am - 6:00pm; Wed 8:00am - 6:00pm; Thu 8:00am - 6:00pm; Fri 8:00am - 6:00pm; Email: [email or web address] Eligibility: Residents of [place], [place], [place], [place], [place] and [place] that meet income limits. Call or text for eligibility screening.   [facility] MINISTRY [address] [place], [place], [place] [address] [phone] (Main: Rental Assistance) [phone] (Main: Utility Assistance) Mon 8:30am - 5:00pm; Tue 8:30am - 5:00pm; Wed 8:30am - 5:00pm; Thu 8:30am - 5:00pm; Fri 8:30am - 5:00pm; Website: http://www.[email or web address]/emergency-assistance-program Eligibility: People who have an unexpected crisis or emergency that can be verified. Must have some form of income and meet income limits. At the first of the month, only helps with rent/mortgage assistance for those who have court ordered eviction notices. Call for application information. Call for exact documents that will be needed. Examples of documents that may be needed: Photo ID, Social Security cards for everyone in the household, and proof of income for previous 2 months. Copy of eviction notice for rent assistance and copy of final notice for utility assistance. Statements or receipts of bills for previous 2 months.   [facility] - [place] [address], [place], [place] [address] [phone] (Main) [phone] (Alternate) Mon 9:00am - 5:00pm; Tue 9:00am - 5:00pm; Wed 9:00am - 5:00pm; Thu 9:00am - 5:00pm; Fri 9:00am - 5:00pm; Website: [email or web address]/emergency-[email or web address]-assistance Email: [email or web address] Eligibility: People experiencing a housing crisis with past-due rent and/or utilities and meet income limits. Must be willing to take part in 6 Call or visit  website to download application. Return complete application by mail or email only. Documents: Help with Utilities: Photo ID, proof of household income, copies of monthly bills or receipts, and a final disconnection/shut-off notice. Help with Rent or Mortgage: Photo ID, proof of income, copies of monthly bills or receipts, and eviction notice. Help with Household Goods: Photo ID, proof of household income, copies of monthly bills or receipts, and a fire or flood report.  [facility] [place] [address], [place], [place] [address] [phone] (Main) Mon 8:00am - 5:00pm; Tue 8:00am - 5:00pm; Wed 8:00am - 5:00pm; Thu 8:00am - 5:00pm; Fri 8:00am - 12:00pm; Website: [email or web address].[email or web address]/emergency-financial-assistance Email: [email or web address] Call for eligibility information. Apply :Utilities Assistance: Visit office by 8:30am on 1st and 4th Monday of each month to pick up application. Rent and Mortgage Assistance: Visit office by 8:30am on 2nd and 3rd Monday of each month to pick up application. NOTE: If Monday falls on a holiday applications can be picked up the following Tuesday. Documents required will be listed on application.  [facility] [phone] (Main) Seen by appointment only. Call for more information. Eligibility: Meet income limits. Apply: Call for information on how to schedule an appointment. Each month there is a specific day to call to schedule an appointment. It is stated on the agency voicemail message. Appointments fill up quickly each month. Documents: Photo ID, copy of current utility bill.  [facility] [place] [address], [place], [place] [address] [phone] (Main) Tue 9:00am - 4:00pm; Wed 9:00am - 4:00pm; Thu 9:00am - 4:00pm; Website: [email or web address].[email or web address] Email: [email or web address] Eligibility: Utility Assistance: Meet income  limits and be a Holiday representative. [employer] customers do not qualify. Must not have received utility assistance for another agency within the last 90 days. Rent Assistance: Residents of [place] who  meet income limits. Must not have received rent assistance for another agency within the last 90 days. Apply: Call to schedule an appointment. Documents: Utility Assistance: Photo ID, City of [employer], copy of lease (if not paying a mortgage), proof of income, and monthly expenses. Rent Assistance: Photo ID, W-9 from the landlord, copy of the lease, proof of income, and a list of monthly expenses.  [facility] - [place] [address], [place], [place] [address] [phone] (Main: Help With Rent) [phone] (Main: Help With Utilities) Mon 9:00am - 4:00pm; Tue 9:00am - 4:00pm; Wed 9:00am - 4:00pm; Thu 9:00am - 4:00pm; Fri 9:00am - 4:00pm; Website: [email or web address].no Email: [email or web address] Eligibility: People experiencing a financial crisis. Apply: Call to schedule an appointment Wednesday, 7:30am. Documents: Photo ID, Social Security card, proof of income, and proof of address. Other documents may be required, depending on service. Call for more information.  LOW INCOME ENERGY ASSISTANCE PROGRAM DEPARTMENT OF SOCIAL SERVICES - [facility] [address], [place], [place] [address] [phone] (Main) Mon 8:00am - 5:00pm; Tue 8:00am - 5:00pm; Wed 8:00am - 5:00pm; Thu 8:00am - 5:00pm; Fri 8:00am - 5:00pm; Website: [email or web address] Eligibility: Meet income limits and resource guidelines. Each household is only eligible once, even if multiple members apply. Apply: Call to see if funds are available. Visit to complete an application, call to have 1 mailed, or apply online at [email or web address] NOTE: Households  with a person age 73 and over or a person with a documented disability can apply beginning December 1. Other households can apply beginning January 1. Documents: Photo ID, birth certificate, proof of household income, copy of utility bill, latest bank statement, the names and Social Security numbers for everyone in the household, and proof of disability if under age 56.  LOW INCOME ENERGY ASSISTANCE PROGRAM DEPARTMENT OF SOCIAL SERVICES - [facility] [address] [place], [place], [place] [address] [phone] (Main) Mon 8:00am - 5:00pm; Tue 8:00am - 5:00pm; Wed 8:00am - 5:00pm; Thu 8:00am - 5:00pm; Fri 8:00am - 5:00pm; Website: [email or web address] Eligibility: Meet income limits and resource guidelines. Each household is only eligible once, even if multiple members apply. Apply: Call to see if funds are available. Visit to complete an application, call to have 1 mailed, or apply online at [email or web address] NOTE: Households with a person age 34 and over or a person with a documented disability can apply beginning December 1. Other households can apply beginning January 1. Documents: Photo ID, birth certificate, proof of household income, copy of utility bill, latest bank statement, the names and Social Security numbers for everyone in the household, and proof of disability if under age 71.   [employer] assistance programs Crisis assistance programs  -Partners Ending Homelessness Arts development officer. If you are experiencing homelessness in [place], [place], your first point of contact should be Pensions consultant. You can reach Coordinated Entry by calling [phone] or by emailing [email or web address] [email or web address].  Community access points: [employer] [phone] [address], [place]) every Tuesday from 9am-10am. [facility] ([address] [place]) every Wednesday from 8am-9am.    -[place] Coordinated Re-entry Marcy Panning: Dial 211 and request. Offers referrals to homeless shelters in the area.    -The [employer] 828-197-3007) offers several services to local families, as funding allows. The Emergency Assistance Program (EAP), which they administer, provides household goods, free food, clothing, and financial aid to people in need in the [facility] area. The EAP  program does have some qualification, and counselors will interview clients for financial assistance by written referral only. Referrals need to be made by the Department of Social Services or by other EAP approved human services agencies or charities in the area.  -Open Door Ministries of [place], which can be reached at [phone], offers emergency assistance programs for those in need of help, such as food, rent assistance, a soup kitchen, shelter, and clothing. They are based in [facility] but provide a number of services to those that qualify for assistance.   [facility] Department of Social Services may be able to offer temporary financial assistance and cash grants for paying rent and utilities, Help may be provided for local county residents who may be experiencing personal crisis when other resources, including government programs, are not available. Call [phone]  -High [employer] Army is a [employer] agency, The organization can offer emergency assistance for paying rent, Caremark Rx, utilities, food, household products and furniture. They offer extensive emergency and transitional housing for families, children and single women, and also run a Boy's and [employer]. Thrift Shops, Secondary school teacher, and other aid offered too. [address], [place], [place] [address], [phone]  -[place] Low Income Energy Assistance Program -- This is offered for [facility] families. The federal government created [employer] Program provides a one-time cash grant payment to help eligible low-income families pay their electric and heating bills. [address] [place], [place] [address], [phone]  -[place] Emergency Assistance -- A program offers emergency utility and rent funds for greater [place] area residents. The program can also provide counseling and referrals to charities and government programs. Also provides food and a free meal program that serves lunch Mondays - Saturdays and dinner seven days per week to individuals in the community. [address] [place], [place] [address], [phone]  -[employer] - Offers affordable apartment and housing communities across      [place] and [place]. The low income and seniors can access public housing, rental assistance to qualified applicants, and apply for the section 8 rent subsidy program. Other programs include Chiropractor and Engineer, maintenance. [address], [place], [place] [address], dial [phone].  -The [facility] provides transitional housing to veterans and the disabled. Clients will also access other services too, including assistance in applying for Disability, life skills classes, case management, and assistance in finding permanent housing. [address], [place], [place] [address], call [phone]  -[facility] Transitional Housing through [employer] is for people who were just evicted or that are formerly homeless. The non-profit will also help then gain self-sufficiency, find a home or apartment to live in, and also provides information on rent assistance when needed. Phone [phone]  -The [place] [facility] helps low income, elderly, or disabled residents in seven counties in the [place] ([place], [place], [place],  [place], [place], [place], [place], and [place]) save energy and reduce their utility bills by improving energy efficiency. Phone [phone].  -[employer] is located in the [place] [facility] in the [employer], [address], [place], [place] [address]. Parking is in the rear of the building. Phone: [phone]   General Email: info@gsohc .org  GHC provides free housing counseling assistance in locating affordable rental housing or housing with support services for families and individuals  in crisis and the chronically homeless. We provide potential resources for other housing needs like utilities. Our trained counselors also work with clients on budgeting and financial literacy in effort to empower them to take control of their financial situations. [employer] collaborates with homeless service providers and other stakeholders as part of the [employer] COC (Continuum of Care). The (COC) is a regional/local planning body that coordinates housing and services funding for homeless families and individuals. The role of [facility] in the COC is through housing counseling to work with people we serve on diversion strategies for those that are at imminent risk of becoming homeless. We also work with the Coordinated Assessment/Entry Specialist who attempts to find temporary solutions and/or connects the people to Housing First, Rapid Re-housing or transitional housing programs. Our Homelessness Prevention Housing Counselors meet with clients on business days (Monday-Fridays, except scheduled holidays) from 8:30 am to 4:30 pm.  Legal assistance for evictions, foreclosure, and more -If you need free legal advice on civil issues, such as foreclosures, evictions, Electronics engineer, government programs, domestic issues and more, Armed forces operational officer Aid of [facility]) is a Associate Professor firm that provides free legal services and counsel to lower income  people, seniors, disabled, and others, The goal is to ensure everyone has access to justice and fair representation. Call them at ([phone].  [facility] for Housing and Community Studies can provide info about obtaining legal assistance with evictions. Phone [phone].  Data processing manager  The [employer], [employer]. offers job and Dispensing optician. Resources are focused on helping students obtain the skills and experiences that are necessary to compete in today's challenging and tight job market. The non-profit faith-based community action agency offers internship trainings as well as classroom instruction. Classes are tailored to meet the needs of people in the [facility] region. [place], [place] [address], [phone]  Foreclosure prevention/Debt Services Family Services of the [employer] Credit Counseling Service inludes debt and foreclosure prevention programs for local families. This includes money management, financial advice, budget review and development of a written action plan with a Pensions consultant to help solve specific individual financial problems. In addition, housing and mortgage counselors can also provide pre- and post-purchase homeownership counseling, default resolution counseling (to prevent foreclosure) and reverse mortgage counseling. A Debt Management Program allows people and families with a high level of credit card or medical debt to consolidate and repay consumer debt and loans to creditors and rebuild positive credit ratings and scores. Contact ([phone] [phone].  Community clinics in [place] -Health Department [facility]: [address] [place], [place], [address]. [phone].  -Health Department [place] [facility]: [address], [facility], [address]. [phone].  -[facility] offers medical care through a group of doctors, pharmacies and other healthcare related  agencies that offer services for low income, uninsured adults in [place]. Also offers adult Dental care and assistance with applying for an [employer]. Call [phone].   [facility]. This center provides low-cost health care to those without health insurance. Services offered include an onsite pharmacy. Phone [phone]. [address], [address], [place].  -Medication Assistance Program serves as a link between pharmaceutical companies and patients to provide low cost or free prescription medications. This service is available for residents who meet certain income restrictions and have no insurance coverage. PLEASE CALL [phone] ([place]) OR [phone] ([place])  -One Step Further:  Materials engineer, The [employer] Support & Nutrition Program, [employer]. Call ([phone]/ [phone].  Food pantry and assistance -Urban Ministry-Food Bank: [address], [place] [address]. Phone [phone]  -Blessed Table Food Pantry: [address] [place], [place] [address]. [phone].  -Missionary Ministry: has the purpose of visiting the sick and shut-ins and provide for needs in the surrounding communities. Call ([phone]) [phone]. Email: [email or web address] This program provides: Food box for seniors, Financial assistance, Food to meet basic nutritional needs.  -Meals on Wheels with Senior Resources: [facility] residents age 55 and over who are homebound and unable to obtain and prepare a nutritious meal for themselves are eligible for this service. There may be a waiting list in certain parts of [facility] if the route in that area is full. If you are in [facility] and [place] call [phone] to register. For all other areas call [phone] to register.  -Greater Dietitian: https://findfood.BargainContractor.si  TRANSPORTATION: -[employer] Department of  Health: Call [facility]) and [employer] at [phone] for details. AttractionGuides.es  -Access [place]: Access [place] is the [employer] Agency's shared-ride transportation service for eligible riders who have a disability that prevents them from riding the fixed route bus. Call [phone]. Access [place] riders must pay a fare of $1.50 per trip, or may purchase a 10-ride punch card for $14.00 ($1.40 per ride) or a 40-ride punch card for $48.00 ($1.20 per ride).  -The Shepherd's WHEELS rideshare transportation service is provided for senior citizens (60+) who live independently within [place] city limits and are unable to drive or have limited access to transportation. Call [phone] to schedule an appointment.  -[place] Transportation: For Medicare or Medicaid recipients call 2541391427?[facility], wheelchair Zenaida Niece, and ambulatory quotes available.   FLEEING VIOLENCE: -Family Services of the [place]- 24/7 Crisis line [phone]) -[facility] Justice Centers: (336) 641-[phone] [phone])  [place] 2-1-1 is another useful way to locate resources in the community. Visit ShedSizes.ch to find service information online. If you need additional assistance, 2-1-1 Referral Specialists are available 24 hours a day, every day by dialing 2-1-1 or [phone] from any phone. The call is free, confidential, and available in any language.  Affordable Housing Search http://www.nchousingsearch.org  [facility])   M-F 8a-3p [address] [place], [place] [address] [phone] Services include: laundry, barbering, support groups, case management, phone & computer access, showers, AA/NA mtgs, mental health/substance abuse nurse, job skills class, disability information, VA assistance, spiritual classes, etc. Winter Shelter available when temperatures are  less than 32 degrees.   HOMELESS SHELTERS [facility] at [facility]- Call [phone] ext. 347 or ext. 336. Located at [address] [place], [place] [address]  [facility] Mens Shelter- Call [phone]. Located at [address] [place] [address].  [facility]- [employer]. Call [phone]. Office located at [address], [place] [address].  Pathways Family Housing through [place] [phone].  [facility] Family Shelter- Call [phone]. Located at [address] [place], [place], [place] [address].  Room at the Inn-For Pregnant mothers. Call [phone]. Located at [address] [place], [address].  [facility]-For men in [place]. Call [phone]. [facility]-Shelter in [place]. Call [phone].  Home of [employer] for [employer] [phone]. Office located at [address] [place], [address].  [employer] be agreeable to help with  chores. Call [phone] ext. [phone].  Men's: [address] [place], [place] [address]. Women's: [facility]  [address] [place], [place] [address]  Crisis Services [facility] Mobile Crisis Management- [phone]  [facility] [address] [place], [place] [address]. Phone: [phone]

## 2023-10-07 NOTE — TOC Progression Note (Addendum)
 Transition of Care [facility]) - Progression Note    Patient Details  Name: [name] [name] MRN: [id number] Date of Birth: [birth year]  [facility]) CM/SW Contact  Marliss Coots, LCSW Phone Number: 10/07/2023, 10:16 AM  Clinical Narrative:     10:17 AM CSW introduced self and role to patient at bedside. CSW followed up on residence. Patient informed CSW that she did not reside at ALF prior to SNF admissions. Patient stated that she resides on the first floor of an apartment and that her daughter would be able to provide transportation for her at discharge. CSW made medical team ware. CSW followed up with patient on SDOH needs (housing). Patient confirmed need and consented CSW to add resources to AVS. CSW added resources to AVS.    Barriers to Discharge: Continued Medical Work up  Expected Discharge Plan and Services In-house Referral: Clinical Social Work Discharge Planning Services: CM Consult Post Acute Care Choice: Skilled Nursing Facility Living arrangements for the past 2 months: Apartment, Skilled Nursing Facility                                       Social Determinants of Health (SDOH) Interventions SDOH Screenings   Food Insecurity: No Food Insecurity (10/05/2023)  Recent Concern: Food Insecurity - Food Insecurity Present (08/07/2023)  Housing: High Risk (10/05/2023)  Transportation Needs: No Transportation Needs (10/05/2023)  Utilities: Not At Risk (10/05/2023)  Alcohol Screen: Low Risk  (08/07/2023)  Depression (PHQ2-9): Low Risk  (09/26/2023)  Recent Concern: Depression (PHQ2-9) - Medium Risk (08/07/2023)  Financial Resource Strain: Medium Risk (08/07/2023)  Physical Activity: Inactive (08/07/2023)  Social Connections: Unknown (10/05/2023)  Recent Concern: Social Connections - Moderately Isolated (09/16/2023)  Stress: No Stress Concern Present (08/07/2023)  Tobacco Use: High Risk (10/05/2023)  Health Literacy: Inadequate Health Literacy (08/07/2023)     Readmission Risk Interventions    06/20/2023    3:17 PM  Readmission Risk Prevention Plan  Transportation Screening Complete  HRI or Home Care Consult Complete  Social Work Consult for Recovery Care Planning/Counseling Complete  Palliative Care Screening Not Applicable  Medication Review Oceanographer) Complete

## 2023-10-08 ENCOUNTER — Encounter: Payer: Self-pay | Admitting: Hematology

## 2023-10-08 ENCOUNTER — Other Ambulatory Visit (HOSPITAL_COMMUNITY): Payer: Self-pay

## 2023-10-08 DIAGNOSIS — J9621 Acute and chronic respiratory failure with hypoxia: Secondary | ICD-10-CM | POA: Diagnosis not present

## 2023-10-08 LAB — CBC
HCT: 36 % (ref 36.0–46.0)
Hemoglobin: 11.6 g/dL — ABNORMAL LOW (ref 12.0–15.0)
MCH: 31.9 pg (ref 26.0–34.0)
MCHC: 32.2 g/dL (ref 30.0–36.0)
MCV: 98.9 fL (ref 80.0–100.0)
Platelets: 547 10*3/uL — ABNORMAL HIGH (ref 150–400)
RBC: 3.64 MIL/uL — ABNORMAL LOW (ref 3.87–5.11)
RDW: 15.7 % — ABNORMAL HIGH (ref 11.5–15.5)
WBC: 14.2 10*3/uL — ABNORMAL HIGH (ref 4.0–10.5)
nRBC: 0 % (ref 0.0–0.2)

## 2023-10-08 LAB — GLUCOSE, CAPILLARY
Glucose-Capillary: 188 mg/dL — ABNORMAL HIGH (ref 70–99)
Glucose-Capillary: 236 mg/dL — ABNORMAL HIGH (ref 70–99)

## 2023-10-08 LAB — BASIC METABOLIC PANEL
Anion gap: 7 (ref 5–15)
BUN: 25 mg/dL — ABNORMAL HIGH (ref 8–23)
CO2: 28 mmol/L (ref 22–32)
Calcium: 9 mg/dL (ref 8.9–10.3)
Chloride: 104 mmol/L (ref 98–111)
Creatinine, Ser: 0.75 mg/dL (ref 0.44–1.00)
GFR, Estimated: >60 mL/min (ref >60)
Glucose, Bld: 156 mg/dL — ABNORMAL HIGH (ref 70–99)
Potassium: 4.4 mmol/L (ref 3.5–5.1)
Sodium: 139 mmol/L (ref 135–145)

## 2023-10-08 MED ORDER — GUAIFENESIN ER 600 MG PO TB12
600.0000 mg | ORAL_TABLET | Freq: Two times a day (BID) | ORAL | 0 refills | Status: AC
  Filled 2023-10-08: qty 6, 3d supply, fill #0

## 2023-10-08 MED ORDER — PREDNISONE 10 MG PO TABS
10.0000 mg | ORAL_TABLET | Freq: Every day | ORAL | 0 refills | Status: DC
  Filled 2023-10-08: qty 20, 8d supply, fill #0

## 2023-10-08 MED ORDER — NICOTINE 14 MG/24HR TD PT24
14.0000 mg | MEDICATED_PATCH | Freq: Every day | TRANSDERMAL | 0 refills | Status: DC
  Filled 2023-10-08: qty 28, 28d supply, fill #0

## 2023-10-08 MED ORDER — DOXYCYCLINE HYCLATE 100 MG PO TABS
100.0000 mg | ORAL_TABLET | Freq: Two times a day (BID) | ORAL | 0 refills | Status: AC
  Filled 2023-10-08: qty 4, 2d supply, fill #0

## 2023-10-08 NOTE — Progress Notes (Signed)
 According to patient's RN, the Charge RN explained discharge instructions to the patient. Angie patient's RN expressed the patient's daughter plans to pick up the patient at 4:30pm and will bring her oxygen tank from home. IV was removed by staff. Will transport downstairs to discharge lounge .

## 2023-10-08 NOTE — Plan of Care (Signed)

## 2023-10-08 NOTE — TOC Transition Note (Signed)
 Transition of Care [facility]) - Discharge Note   Patient Details  Name: [name] [name] MRN: [id number] Date of Birth: 10/14/1955  Transition of Care [facility]) CM/SW Contact:  Harriet Masson, RN Phone Number: 10/08/2023, 10:58 AM   Clinical Narrative:       Spoke to patient regarding transition needs.  [facility] home [facility] declined patient due to being noncompliant.  MD and patient is aware. PCP apt made for 10/09/23 and added to AVS.  Daughter will transport home with home 02.  Final next level of care: Home/Self Care Barriers to Discharge: Barriers Resolved   Patient Goals and CMS Choice Patient states their goals for this hospitalization and ongoing recovery are:: to go home          Discharge Placement                 home      Discharge Plan and Services Additional resources added to the After Visit Summary for   In-house Referral: PCP / [facility] Discharge Planning Services: Follow-up appt scheduled Post Acute Care Choice: Skilled Nursing Facility                               Social Drivers of Health (SDOH) Interventions SDOH Screenings   Food Insecurity: No Food Insecurity (10/05/2023)  Recent Concern: Food Insecurity - Food Insecurity Present (08/07/2023)  Housing: High Risk (10/05/2023)  Transportation Needs: No Transportation Needs (10/05/2023)  Utilities: Not At Risk (10/05/2023)  Alcohol Screen: Low Risk  (08/07/2023)  Depression (PHQ2-9): Low Risk  (09/26/2023)  Recent Concern: Depression (PHQ2-9) - Medium Risk (08/07/2023)  Financial Resource Strain: Medium Risk (08/07/2023)  Physical Activity: Inactive (08/07/2023)  Social Connections: Unknown (10/05/2023)  Recent Concern: Social Connections - Moderately Isolated (09/16/2023)  Stress: No Stress Concern Present (08/07/2023)  Tobacco Use: High Risk (10/05/2023)  Health Literacy: Inadequate Health Literacy (08/07/2023)     Readmission Risk Interventions    06/20/2023    3:17 PM  Readmission Risk  Prevention Plan  Transportation Screening Complete  HRI or Home Care Consult Complete  Social Work Consult for Recovery Care Planning/Counseling Complete  Palliative Care Screening Not Applicable  Medication Review Oceanographer) Complete

## 2023-10-08 NOTE — Progress Notes (Signed)
 Mobility Specialist Progress Note:   10/08/23 1046  Mobility  Activity Ambulated independently in hallway  Level of Assistance Independent  Assistive Device None  Distance Ambulated (ft) 250 ft  Activity Response Tolerated well  Mobility Referral Yes  Mobility visit 1 Mobility  Mobility Specialist Start Time (ACUTE ONLY) 0935  Mobility Specialist Stop Time (ACUTE ONLY) 0950  Mobility Specialist Time Calculation (min) (ACUTE ONLY) 15 min   Pre Mobility: 93% SpO2 RA During Mobility: 76 HR , 82-94% SpO2 RA - 2 L Post Mobility:  92% SpO2 2 L  Pt received in bed, agreeable to mobility. Desat to 82% on RA. Pursed lip breathing encouraged. Pt needing 2 L to keep sats above 88%. No physical assist needed during ambulation, asx throughout. Pt left in chair with call bell in reach and all needs met.   Leory Plowman  Mobility Specialist Please contact via [employer] office at [phone]

## 2023-10-08 NOTE — Consult Note (Signed)
 [facility] Liaison Consult Note   10/08/2023  [name] [name] 1956/02/12 [id number]   Insurance:  [employer] [SNP patient]  Primary Care Provider: Martina Sinner, MD is a Pulmonologist with [facility] at [facility], this provider is listed for the transition of care follow up appointments.  09/12/23 Encounter reveals patient is no longer under Dr. Jonah Blue at [facility].  Came by to speak with patient about PCP.   [facility] Liaison met patient at bedside at [facility].  States patient had just left floor and call attempts to patient's listed number but unable to leave a voicemail message.  1:40 pm Patient returned call and HIPAA provided. Explained follow up and states she "follows up with Dr. Francine Graven because all of my issues are pulmonary."  Patient verbalizes acceptance for follow up.   The patient was screened for 30 day readmission hospitalization with noted extreme risk score for unplanned readmission risk 6 hospital admissions in 6 months.  The patient was assessed for potential [facility] Coordination service needs for post hospital transition for care coordination. Review of patient's electronic medical record reveals patient is admitted with.COPD exacerbation.   Plan: [facility] Liaison will continue to follow progress and disposition to assess for post hospital community care coordination/management needs.  Referral request for community care coordination: Anticipate follow up and make a referral to VBCI Team.   [facility] does not replace or interfere with any arrangements made by the Inpatient Transition of Care team.   For questions contact:   Charlesetta Shanks, RN, BSN, CCM   [facility] Liaison Direct Dial: [phone] or secure chat Email: [email or web address]

## 2023-10-08 NOTE — Progress Notes (Signed)
 Nurse requested Mobility Specialist to perform oxygen saturation test with pt which includes removing pt from oxygen both at rest and while ambulating.  Below are the results from that testing.     Patient Saturations on Room Air at Rest = spO2 93%  Patient Saturations on Room Air while Ambulating = sp02 82% .  Rested and performed pursed lip breathing for 1 minute with sp02 at 85%.  Patient Saturations on 2 Liters of oxygen while Ambulating = sp02 94%  At end of testing pt left in room on 2 Liters of oxygen.  Reported results to nurse.

## 2023-10-08 NOTE — Plan of Care (Signed)

## 2023-10-08 NOTE — Discharge Summary (Signed)
 Physician Discharge Summary  [name] [name]:[id number] DOB: March 19, 1956 DOA: 10/05/2023  PCP: Martina Sinner, MD  Admit date: 10/05/2023 Discharge date: 10/08/2023  Admitted From: Home Disposition:  Home  Discharge Condition:Stable CODE STATUS:DNR Diet recommendation: Heart Healthy   Brief/Interim Summary: [name] SCHWEGEL is a 68 y.o. female with past medical history significant for chronic hypoxic respiratory failure, COPD on 2 L nasal cannula baseline, HTN, hypothyroidism, CAD, bipolar disorder, tobacco abuse disorder, prediabetes, iron deficiency anemia, and recent hospitalization for fall resulting in multiple right rib fractures and small pneumothorax and discharged to SNF on 09/23/2023 who presented to [facility] ED on 10/05/2023 via EMS with 2-day history of progressive shortness of breath, cough.  On presentation she was hemodynamically stable.  COVID/flu/RSV negative.  Chest x-ray showed small bilateral pleural effusion no other acute cardiopulmonary abnormalities.  CT angiogram negative for PE.  She was admitted for COPD exacerbation.  Started on IV steroids.  Her respiratory status has significantly improved.  Currently she is on baseline oxygen requirement, 2 L.  PT did not recommend any follow-up.  She is medically stable for discharge home today.  Following problems were addressed during the hospitalization:  Acute on chronic hypoxemic respiratory failure COPD exacerbation Follows with pulmonology.  Presented with wheezing, shortness of breath.  Treated with steroids, antibiotics.  Currently on baseline oxygen requirement.  We recommend to follow-up with pulmonology as an outpatient.  She needs to quit smoking.   Recent fall with right-sided rib fractures and pneumothorax CT imaging notable for healing right rib fractures.  Previous pneumothorax has resolved. PT did not recommend any follow-up   Essential hypertension -- Amlodipine 5 mg p.o. daily -- Losartan 75 mg  p.o. daily Continue above medications   CAD with prior PTCA -- Nebivolol 10 mg p.o. daily -- Atorvastatin 80 mg p.o. daily -- Zetia 10 mg p.o. nightly Continue above medications   Hypothyroidism -- Levothyroxine 112 mcg p.o. daily   Bipolar disorder Anxiety/depression -- Remeron 45 mg p.o. nightly -- BuSpar 30 mg p.o. twice daily -- Doxepin 75 mg p.o. nightly -- Lexapro 20 mg p.o. nightly -- Clonazepam 0.5 mg p.o. 3 times daily as needed anxiety Continue other medications   Iron deficiency anemia Hemoglobin 11.5, stable.   Tobacco use disorder Counseled on need for complete cessation/abstinence. --Nicotine patch was ordered   Obesity, class III Body mass index is 35.19 kg/m.  Complicates all facets of care   Discharge Diagnoses:  Principal Problem:   Acute on chronic hypoxic respiratory failure [facility])    Discharge Instructions  Discharge Instructions     Diet - low sodium heart healthy   Complete by: As directed    Discharge instructions   Complete by: As directed    1)Please take prescribed medications as instructed 2)Follow up with your PCP and pulmonologist as an outpatient 3)Quit smoking   Increase activity slowly   Complete by: As directed       Allergies as of 10/08/2023       Reactions   Avocado Anaphylaxis   Latex Shortness Of Breath, Rash   Codeine Nausea Only        Medication List     STOP taking these medications    nicotine 21 mg/24hr patch Commonly known as: NICODERM CQ - dosed in mg/24 hours Replaced by: nicotine 14 mg/24hr patch       TAKE these medications    Accu-Chek Guide Test test strip Generic drug: glucose blood Use as instructed to  check blood sugars once a day   Accu-Chek Guide w/Device Kit Use to check blood sugars once a day   Accu-Chek Softclix Lancets lancets Use to check blood sugars once a day   acetaminophen 500 MG tablet Commonly known as: TYLENOL Take 2 tablets (1,000 mg total) by mouth every 6  (six) hours. What changed:  when to take this reasons to take this   alendronate 35 MG tablet Commonly known as: FOSAMAX Take 1 tablet (35 mg total) by mouth every 7 (seven) days. Take with a full glass of water on an empty stomach. Sit upright for 30-60 mins after taking   amLODipine 5 MG tablet Commonly known as: NORVASC Take 1 tablet (5 mg total) by mouth daily. What changed: when to take this   aspirin EC 81 MG tablet Take 81 mg by mouth in the morning.   atorvastatin 80 MG tablet Commonly known as: LIPITOR Take 1 tablet (80 mg total) by mouth daily. What changed: when to take this   Brilinta 60 MG Tabs tablet Generic drug: ticagrelor Take 1 tablet (60 mg total) by mouth 2 (two) times daily.   busPIRone 15 MG tablet Commonly known as: BUSPAR Take 2 tablets (30 mg total) by mouth in the morning AND 1 tablet (15 mg total) daily at 12 noon AND 1 tablet (15 mg total) every evening. What changed: See the new instructions.   clonazePAM 0.5 MG tablet Commonly known as: KlonoPIN Take 1 tablet (0.5 mg total) by mouth 3 (three) times daily as needed for anxiety. What changed: when to take this   docusate sodium 100 MG capsule Commonly known as: COLACE Take 1 capsule (100 mg total) by mouth 2 (two) times daily as needed for mild constipation.   doxepin 75 MG capsule Commonly known as: SINEQUAN Take 1 capsule (75 mg total) by mouth at bedtime.   doxycycline 100 MG tablet Commonly known as: VIBRA-TABS Take 1 tablet (100 mg total) by mouth every 12 (twelve) hours for 2 days.   escitalopram 20 MG tablet Commonly known as: LEXAPRO Take 1 tablet (20 mg total) by mouth daily. What changed: when to take this   ezetimibe 10 MG tablet Commonly known as: ZETIA Take 1 tablet (10 mg total) by mouth daily. What changed: when to take this   furosemide 20 MG tablet Commonly known as: LASIX Take 1 tablet (20 mg total) by mouth daily. What changed: when to take this   gabapentin  600 MG tablet Commonly known as: NEURONTIN TAKE 1 TABLET BY MOUTH 4 TIMES DAILY What changed:  how much to take when to take this   guaiFENesin 600 MG 12 hr tablet Commonly known as: MUCINEX Take 1 tablet (600 mg total) by mouth 2 (two) times daily for 3 days.   ipratropium-albuterol 0.5-2.5 (3) MG/3ML Soln Commonly known as: DUONEB Take 3 mLs by nebulization every 6 (six) hours as needed. What changed: when to take this   levothyroxine 112 MCG tablet Commonly known as: SYNTHROID Take 1 tablet (112 mcg total) by mouth daily. What changed: when to take this   lidocaine 5 % Commonly known as: LIDODERM Place 1 patch onto the skin daily. Remove & Discard patch within 12 hours or as directed by MD   losartan 50 MG tablet Commonly known as: COZAAR Take 1.5 tablets (75 mg total) by mouth daily. What changed: when to take this   metFORMIN 500 MG tablet Commonly known as: GLUCOPHAGE Take 1 tablet (500 mg total) by  mouth 2 (two) times daily with a meal.   mirtazapine 45 MG tablet Commonly known as: REMERON Take 1 tablet (45 mg total) by mouth at bedtime.   montelukast 10 MG tablet Commonly known as: SINGULAIR Take 1 tablet (10 mg total) by mouth at bedtime.   nebivolol 10 MG tablet Commonly known as: Bystolic Take 1 tablet (10 mg total) by mouth daily.   nicotine 14 mg/24hr patch Commonly known as: NICODERM CQ - dosed in mg/24 hours Place 1 patch (14 mg total) onto the skin daily. Start taking on: October 09, 2023 Replaces: nicotine 21 mg/24hr patch   nitroGLYCERIN 0.4 MG SL tablet Commonly known as: NITROSTAT Place 1 tablet (0.4 mg total) under the tongue every 5 (five) minutes as needed for chest pain.   omeprazole 40 MG capsule Commonly known as: PRILOSEC Take 1 capsule (40 mg total) by mouth 2 (two) times daily before a meal.   polyethylene glycol 17 g packet Commonly known as: MIRALAX / GLYCOLAX Take 17 g by mouth daily as needed for severe constipation.    predniSONE 10 MG tablet Commonly known as: DELTASONE Take 1 tablet (10 mg total) by mouth daily with breakfast. Take 4 pills daily for 2 days then 3 pills daily for 2 days then 2 pills daily for 2 days then 1 pill daily for 2 days then stop Start taking on: October 09, 2023 What changed: See the new instructions.   Spacer/Aero-Holding [employer] Use with the albuterol inhaler.   Trelegy Ellipta 200-62.5-25 MCG/ACT Aepb Generic drug: Fluticasone-Umeclidin-Vilant Inhale 1 puff into the lungs daily. What changed: when to take this   Ventolin HFA 108 (90 Base) MCG/ACT inhaler Generic drug: albuterol INHALE 2 PUFFS into THE lungs EVERY 6 HOURS AS NEEDED FOR wheezing OR SHORTNESS OF BREATH        Follow-up Information     Martina Sinner, MD. Schedule an appointment as soon as possible for a visit in 1 week(s).   Specialty: Pulmonary Disease Contact information: [address] [place] [address] [phone]                Allergies  Allergen Reactions   Avocado Anaphylaxis   Latex Shortness Of Breath and Rash   Codeine Nausea Only    Consultations: None   Procedures/Studies: CT Angio Chest Pulmonary Embolism (PE) W or WO Contrast Result Date: 10/05/2023 CLINICAL DATA:  Chest pain and shortness of breath. Positive D-dimer. EXAM: CT ANGIOGRAPHY CHEST WITH CONTRAST TECHNIQUE: Multidetector CT imaging of the chest was performed using the standard protocol during bolus administration of intravenous contrast. Multiplanar CT image reconstructions and MIPs were obtained to evaluate the vascular anatomy. RADIATION DOSE REDUCTION: This exam was performed according to the departmental dose-optimization program which includes automated exposure control, adjustment of the mA and/or kV according to patient size and/or use of iterative reconstruction technique. CONTRAST:  61mL OMNIPAQUE IOHEXOL 350 MG/ML SOLN COMPARISON:  09/16/2023 FINDINGS: Cardiovascular:  Satisfactory opacification of pulmonary arteries noted, and no pulmonary emboli identified. No evidence of thoracic aortic dissection or aneurysm. Mediastinum/Nodes: No masses or pathologically enlarged lymph nodes identified. Lungs/Pleura: There has been resolution of right pneumothorax since previous study. New small right pleural effusion is seen, with a small loculated portion seen in the lateral right upper lobe. Increased bilateral lower lobe atelectasis is seen, right side greater than left. Mild centrilobular emphysema again noted. Upper abdomen: No acute findings. Musculoskeletal: Multiple right lateral rib fractures are again seen, with No  evidence of early healing since prior exam. No acute fractures or other suspicious bone lesions identified. Review of the MIP images confirms the above findings. IMPRESSION: No evidence of pulmonary embolism. Resolution of right pneumothorax since previous study. Multiple right lateral rib fractures, with evidence of early healing since prior exam. New small right pleural effusion, and increased bilateral lower lobe atelectasis, right side greater than left. Mild emphysema. Electronically Signed   By: Danae Orleans M.D.   On: 10/05/2023 13:08   DG Chest 2 View Result Date: 10/05/2023 CLINICAL DATA:  68 year old female status post fall with right side rib fractures and pneumothorax on 09/16/2023. Shortness of breath. EXAM: CHEST - 2 VIEW COMPARISON:  Chest radiographs 09/29/2023 and earlier. FINDINGS: PA and lateral views 0806 hours. Mildly lower lung volumes. Mediastinal contours remain normal. Bilateral pleural effusions persist, not significantly changed. No pneumothorax. No confluent lung opacity. Osteopenia. Healing right rib fractures are less apparent. No new osseous abnormality identified. Negative visible bowel gas. IMPRESSION: 1. Ongoing small bilateral pleural effusions. No pneumothorax or other acute cardiopulmonary abnormality. 2. Healing right rib  fractures. Electronically Signed   By: Odessa Fleming M.D.   On: 10/05/2023 08:24   DG Chest 2 View Result Date: 10/05/2023 CLINICAL DATA:  68 year old female status post fall with right side rib fractures and pneumothorax on 09/16/2023. Subsequent encounter. EXAM: CHEST - 2 VIEW COMPARISON:  09/23/2023 and earlier. FINDINGS: PA and lateral views 09/29/2023. Small bilateral pleural effusions persist. No pneumothorax. Sequelae of multiple right rib fractures again noted. Normal cardiac size and mediastinal contours. Calcified aortic atherosclerosis. Visualized tracheal air column is within normal limits. Negative visible bowel gas. IMPRESSION: 1. Small bilateral pleural effusions. 2. Healing right rib fractures. Electronically Signed   By: Odessa Fleming M.D.   On: 10/05/2023 08:21   DG CHEST PORT 1 VIEW Result Date: 09/23/2023 CLINICAL DATA:  Right pneumothorax EXAM: PORTABLE CHEST 1 VIEW COMPARISON:  09/23/2023 at 5:44 a.m. FINDINGS: Interval removal of the right pleural drainage catheter. No current well-defined pneumothorax. Mild bibasilar airspace opacity similar to prior. Faint bandlike opacity in the right mid lung, probably from atelectasis or scarring. Atherosclerotic calcification of the aortic arch. Old healed left clavicular fracture. The patient has known right-sided rib fractures. IMPRESSION: 1. Interval removal of the right pleural drainage catheter. No visible pneumothorax. 2. Mild bibasilar airspace opacity similar to prior. 3. Faint bandlike opacity in the right mid lung, probably from atelectasis or scarring. 4. Aortic Atherosclerosis (ICD10-I70.0). Electronically Signed   By: Gaylyn Rong M.D.   On: 09/23/2023 20:24   DG CHEST PORT 1 VIEW Result Date: 09/23/2023 CLINICAL DATA:  Follow-up right chest tube EXAM: PORTABLE CHEST 1 VIEW COMPARISON:  Film from the previous day. FINDINGS: Cardiac shadow is stable. Right-sided pigtail catheter is again noted. No pneumothorax or sizable pleural effusion  is noted. Minimal basilar atelectasis is noted bilaterally. IMPRESSION: No significant interval change from the prior exam. Electronically Signed   By: Alcide Clever M.D.   On: 09/23/2023 10:21   DG CHEST PORT 1 VIEW Result Date: 09/22/2023 CLINICAL DATA:  Right-sided chest tube. EXAM: PORTABLE CHEST 1 VIEW COMPARISON:  09/21/2023 FINDINGS: There is a right-sided pigtail thoracostomy tube overlying the right mid lung. No pneumothorax is visualized. Stable cardiomediastinal contours. Aortic atherosclerotic calcifications. Small left pleural effusion and bibasilar atelectasis, unchanged. IMPRESSION: 1. Right-sided pigtail thoracostomy tube overlying the right mid lung. No pneumothorax visualized. 2. Persistent small left pleural effusion and bibasilar atelectasis. Electronically Signed   By: Ladona Ridgel  Bradly Chris M.D.   On: 09/22/2023 09:34   DG CHEST PORT 1 VIEW Result Date: 09/21/2023 CLINICAL DATA:  Pneumothorax. EXAM: PORTABLE CHEST 1 VIEW COMPARISON:  One-view chest x-ray 09/20/2023. FINDINGS: The heart is mildly enlarged. Atherosclerotic calcifications are present at the aortic arch. Bilateral pleural effusions and basilar atelectasis are present. A tiny right apical pneumothorax remains. Right-sided chest tube is stable. IMPRESSION: 1. Stable tiny right apical pneumothorax. Right-sided chest tube is stable. 2. Bilateral pleural effusions and basilar atelectasis. Electronically Signed   By: Marin Roberts M.D.   On: 09/21/2023 10:05   DG CHEST PORT 1 VIEW Result Date: 09/20/2023 CLINICAL DATA:  [id number] Pneumothorax, right [id number] EXAM: PORTABLE CHEST 1 VIEW COMPARISON:  Chest radiograph from one day prior. FINDINGS: Stable a pigtail right mid chest tube. Similar subcutaneous emphysema in the lateral right chest wall. Stable cardiomediastinal silhouette with normal heart size. Tiny right apical pneumothorax, unchanged. No left pneumothorax. Stable trace left pleural effusion. No significant right pleural  effusion. Slightly low lung volumes with mild to moderate patchy bibasilar lung opacities, unchanged. IMPRESSION: 1. Stable tiny right apical pneumothorax with right chest tube in place. 2. Stable trace left pleural effusion. 3. Slightly low lung volumes with mild to moderate patchy bibasilar lung opacities, unchanged. Electronically Signed   By: Delbert Phenix M.D.   On: 09/20/2023 10:04   DG CHEST PORT 1 VIEW Result Date: 09/19/2023 CLINICAL DATA:  Follow up pneumothorax EXAM: PORTABLE CHEST 1 VIEW COMPARISON:  X-ray 09/18/2023 and older. FINDINGS: Stable right-sided pigtail catheter. Slight decrease in right lateral chest wall gas. Slight decrease in small right apical pneumothorax. Persistent lung base opacities, decreasing from previous. No left-sided pneumothorax. Stable cardiopericardial silhouette with calcified aorta. No edema. Old left clavicle fracture. Overlapping cardiac leads. IMPRESSION: Slight decrease in right apical pneumothorax with small residual. Decreasing chest wall gas. Decreasing lung base opacities. Electronically Signed   By: Karen Kays M.D.   On: 09/19/2023 10:17   DG CHEST PORT 1 VIEW Result Date: 09/18/2023 CLINICAL DATA:  Right pneumothorax. EXAM: PORTABLE CHEST 1 VIEW COMPARISON:  Same day. FINDINGS: Right-sided chest tube is unchanged. Stable small right apical pneumothorax. Bibasilar opacities are again noted. Subcutaneous emphysema is again noted along right lateral chest wall. IMPRESSION: Stable small right apical pneumothorax. Stable position of right-sided chest tube. Electronically Signed   By: Lupita Raider M.D.   On: 09/18/2023 16:21   DG CHEST PORT 1 VIEW Result Date: 09/18/2023 CLINICAL DATA:  Pneumothorax. EXAM: PORTABLE CHEST 1 VIEW COMPARISON:  09/17/2023 FINDINGS: Interval increase in right apical pneumothorax with right pleural drain still in place. Interval increase in atelectasis at the right base. There is some atelectasis or infiltrate at the left base with  tiny left pleural effusion. Subcutaneous emphysema noted in the right lower neck and right chest wall. IMPRESSION: 1. Interval increase in right apical pneumothorax with right pleural drain still in place. 2. Interval increase in atelectasis at the right base. 3. Atelectasis or infiltrate at the left base with tiny left pleural effusion. Electronically Signed   By: Kennith Center M.D.   On: 09/18/2023 09:59   DG Chest Port 1 View Result Date: 09/17/2023 CLINICAL DATA:  [id number]. Acute respiratory distress. Right pneumothorax and rib fractures. EXAM: PORTABLE CHEST 1 VIEW COMPARISON:  Chest, abdomen and pelvis CT with contrast yesterday at 10:43 a.m., portable chest today at 6:08 a.m. FINDINGS: 4:59 p.m. Right chest drainage catheter with the pigtail superimposing in the mid perihilar area is similar in  positioning. Increased right apical pneumothorax is seen today with 2 cm pleural-parenchymal separation and a small subpulmonic pneumothorax component new in the interval. The pneumothorax is approximately 10% of the chest volume previously 5%. There is no appreciable mediastinal shift. Small right pleural effusion continues to be noted. There is patchy increased right chest wall emphysema. No left pneumothorax. Atelectatic bands in both bases with no focal infiltrate being seen. The mediastinum is stable with aortic tortuosity and calcification. The cardiac size is normal. Right lateral fourth through ninth rib fractures are again noted, better demonstrated with CT. IMPRESSION: 1. Increased right apical pneumothorax with 2 cm pleural-parenchymal separation and a small subpulmonic pneumothorax component new in the interval. The pneumothorax is approximately 10% of the chest volume previously 5%. 2. Right chest drainage catheter similar in positioning. 3. Small right pleural effusion. 4. Patchy increased right chest wall emphysema. 5. Right lateral fourth through ninth rib fractures. Electronically Signed   By: Almira Bar M.D.   On: 09/17/2023 20:10   DG Chest Port 1 View Result Date: 09/17/2023 CLINICAL DATA:  68 year old female with right side chest tube, pneumothorax, fall with numerous right side rib fractures. EXAM: PORTABLE CHEST 1 VIEW COMPARISON:  Chest CT yesterday. FINDINGS: Portable AP semi upright view at 0608 hours. Pigtail right chest tube in place. Moderate right chest wall and supraclavicular subcutaneous gas persists. Decreased right pneumothorax, trace pleural edge visible inside the right lateral 3rd rib. Mildly lower lung volumes. Stable cardiac size and mediastinal contours. Probable left lung base atelectasis. No pulmonary edema. Negative visible bowel gas. Right lateral rib fractures better demonstrated by CT. IMPRESSION: 1. Right chest tube with trace residual right pneumothorax. Persistent right chest wall gas. 2. Lower lung volumes with increased left lung base opacity, probably atelectasis. Electronically Signed   By: Odessa Fleming M.D.   On: 09/17/2023 07:12   DG Chest Portable 1 View Result Date: 09/16/2023 CLINICAL DATA:  Rib fractures, pneumothorax, post chest tube placement EXAM: PORTABLE CHEST - 1 VIEW COMPARISON:  Earlier film of the same day FINDINGS: Right pigtail chest tube placement, directed towards the hilum. Small residual apical pneumothorax. Increase in right lateral subcutaneous emphysema. New patchy right perihilar airspace opacities. Left lung clear. Heart size and mediastinal contours are within normal limits. Aortic Atherosclerosis (ICD10-170.0). No effusion. Right rib fracture deformities laterally as before. IMPRESSION: 1. Right chest tube with small residual apical pneumothorax. 2. New right perihilar airspace disease. Electronically Signed   By: Corlis Leak M.D.   On: 09/16/2023 16:03   CT Head Wo Contrast Result Date: 09/16/2023 CLINICAL DATA:  Trauma, ground level fall onto right side last night, chest and arm pain. EXAM: CT HEAD WITHOUT CONTRAST CT CERVICAL SPINE WITHOUT  CONTRAST TECHNIQUE: Multidetector CT imaging of the head and cervical spine was performed following the standard protocol without intravenous contrast. Multiplanar CT image reconstructions of the cervical spine were also generated. RADIATION DOSE REDUCTION: This exam was performed according to the departmental dose-optimization program which includes automated exposure control, adjustment of the mA and/or kV according to patient size and/or use of iterative reconstruction technique. COMPARISON:  MRI cervical spine 03/16/2021, CT head 04/16/2012 FINDINGS: CT HEAD FINDINGS Brain: No acute intracranial hemorrhage. No CT evidence of acute infarct. Nonspecific hypoattenuation in the periventricular and subcortical white matter favored to reflect chronic microvascular ischemic changes. 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 No edema, mass effect, or midline shift. The basilar cisterns are patent. Ventricles: Prominence of the ventricles suggesting underlying parenchymal  volume loss. Vascular: Atherosclerotic calcifications of the carotid siphons. No hyperdense vessel. Skull: No acute or aggressive finding. Orbits: Left lens replacement.  Orbits otherwise symmetric. Sinuses: Mild mucosal thickening in the ethmoid sinuses. Small osteoma in the right ethmoid sinus. Concha bullosa on the right with leftward nasal septal deviation. Other: Mastoid air cells are clear. CT CERVICAL SPINE FINDINGS Alignment: Straightening of the normal cervical lordosis. No listhesis. No facet subluxation or dislocation. Skull base and vertebrae: No acute fracture or compression fracture in the cervical spine. Degenerative endplate changes most pronounced on the right posteriorly at C5-6 and C6-7. No suspicious osseous lesion. Soft tissues and spinal canal: No prevertebral fluid or swelling. No visible canal hematoma. Elongation of the bilateral styloid processes with calcification of the stylohyoid ligaments. Disc levels: Mild disc  space narrowing at C5-6 and C6-7. Central disc protrusion at C4-5 resulting in at least mild spinal canal stenosis. Additional disc osteophyte complexes at C5-6 and C6-7 resulting in mild spinal canal stenosis. Facet arthrosis most pronounced on the right at C5-6 and C6-7. Foraminal narrowing greatest on the right at C5-6 and C6-7. Upper chest: Partially visualized right pneumothorax. Gas noted along the right anterolateral chest wall. Emphysema in the lung apices. Other: None. IMPRESSION: CT HEAD: 1. No acute intracranial abnormality. 2. Moderate chronic microvascular ischemic changes and mild parenchymal volume loss. 3. 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 CT CERVICAL SPINE: 1. No acute fracture or traumatic malalignment of the cervical spine. 2. Partially visualized right pneumothorax. 3. Multilevel degenerative changes of the cervical spine, as described above. 4. Elongation of the bilateral styloid processes with calcification of the stylohyoid ligaments. Correlate for symptoms of Eagle syndrome. Electronically Signed   By: Emily Filbert M.D.   On: 09/16/2023 12:46   CT Cervical Spine Wo Contrast Result Date: 09/16/2023 CLINICAL DATA:  Trauma, ground level fall onto right side last night, chest and arm pain. EXAM: CT HEAD WITHOUT CONTRAST CT CERVICAL SPINE WITHOUT CONTRAST TECHNIQUE: Multidetector CT imaging of the head and cervical spine was performed following the standard protocol without intravenous contrast. Multiplanar CT image reconstructions of the cervical spine were also generated. RADIATION DOSE REDUCTION: This exam was performed according to the departmental dose-optimization program which includes automated exposure control, adjustment of the mA and/or kV according to patient size and/or use of iterative reconstruction technique. COMPARISON:  MRI cervical spine 03/16/2021, CT head 04/16/2012 FINDINGS: CT HEAD FINDINGS Brain: No acute intracranial hemorrhage. No CT evidence of acute  infarct. Nonspecific hypoattenuation in the periventricular and subcortical white matter favored to reflect chronic microvascular ischemic changes. 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 No edema, mass effect, or midline shift. The basilar cisterns are patent. Ventricles: Prominence of the ventricles suggesting underlying parenchymal volume loss. Vascular: Atherosclerotic calcifications of the carotid siphons. No hyperdense vessel. Skull: No acute or aggressive finding. Orbits: Left lens replacement.  Orbits otherwise symmetric. Sinuses: Mild mucosal thickening in the ethmoid sinuses. Small osteoma in the right ethmoid sinus. Concha bullosa on the right with leftward nasal septal deviation. Other: Mastoid air cells are clear. CT CERVICAL SPINE FINDINGS Alignment: Straightening of the normal cervical lordosis. No listhesis. No facet subluxation or dislocation. Skull base and vertebrae: No acute fracture or compression fracture in the cervical spine. Degenerative endplate changes most pronounced on the right posteriorly at C5-6 and C6-7. No suspicious osseous lesion. Soft tissues and spinal canal: No prevertebral fluid or swelling. No visible canal hematoma. Elongation of the bilateral styloid processes with calcification of the  stylohyoid ligaments. Disc levels: Mild disc space narrowing at C5-6 and C6-7. Central disc protrusion at C4-5 resulting in at least mild spinal canal stenosis. Additional disc osteophyte complexes at C5-6 and C6-7 resulting in mild spinal canal stenosis. Facet arthrosis most pronounced on the right at C5-6 and C6-7. Foraminal narrowing greatest on the right at C5-6 and C6-7. Upper chest: Partially visualized right pneumothorax. Gas noted along the right anterolateral chest wall. Emphysema in the lung apices. Other: None. IMPRESSION: CT HEAD: 1. No acute intracranial abnormality. 2. Moderate chronic microvascular ischemic changes and mild parenchymal volume loss. 3. 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 CT CERVICAL SPINE: 1. No acute fracture or traumatic malalignment of the cervical spine. 2. Partially visualized right pneumothorax. 3. Multilevel degenerative changes of the cervical spine, as described above. 4. Elongation of the bilateral styloid processes with calcification of the stylohyoid ligaments. Correlate for symptoms of Eagle syndrome. Electronically Signed   By: Emily Filbert M.D.   On: 09/16/2023 12:46   CT CHEST ABDOMEN PELVIS W CONTRAST Result Date: 09/16/2023 CLINICAL DATA:  Right-sided chest wall, arm and hip pain after ground level fall. EXAM: CT CHEST, ABDOMEN, AND PELVIS WITH CONTRAST TECHNIQUE: Multidetector CT imaging of the chest, abdomen and pelvis was performed following the standard protocol during bolus administration of intravenous contrast. RADIATION DOSE REDUCTION: This exam was performed according to the departmental dose-optimization program which includes automated exposure control, adjustment of the mA and/or kV according to patient size and/or use of iterative reconstruction technique. CONTRAST:  75mL OMNIPAQUE IOHEXOL 350 MG/ML SOLN COMPARISON:  X-ray earlier 09/16/2023 of the chest. Screening chest CT 02/07/2023. Abdomen and pelvis CT without contrast 12/16/2020. FINDINGS: CT CHEST FINDINGS Cardiovascular: Heart is nonenlarged. Trace pericardial fluid. Coronary artery calcifications are seen. The thoracic aorta is normal course and caliber with mild atherosclerotic calcified plaque. Plaque also seen along the origin of the great vessels. Mediastinum/Nodes: Normal caliber thoracic esophagus. Atrophic thyroid gland. No specific abnormal lymph node enlargement identified in the axillary regions, hilum or mediastinum. Lungs/Pleura: Small right-sided pneumothorax particularly anterior and towards the right lung apex. Some medial areas as well. No left-sided pneumothorax. Bandlike opacity along the lung bases, right-greater-than-left. Atelectasis  is favored. Is also some presumed mucous plugging along the right lower lobe. Trace right-sided pleural fluid with possible component hematoma. Underlying centrilobular emphysematous lung changes. There also several scattered noncalcified small lung nodules identified. These include 3 mm left lung series 5, image 53 posteriorly. There are several previously seen which are similar today. There is also a new focus such as posterior left upper lobe series 5, image 46 measuring 4 mm. Recommend continued surveillance in 3 months. Musculoskeletal: Scattered moderate degenerative changes along the spine. Right-sided chest wall gas identified. Numerous right-sided rib fractures identified. This includes the fourth rib, fifth, sixth, seventh, eighth, ninth ribs. CT ABDOMEN PELVIS FINDINGS Hepatobiliary: No focal liver abnormality is seen. No gallstones, gallbladder wall thickening, or biliary dilatation. Patent portal vein Pancreas: Scattered atrophy of the pancreas. Spleen: Normal in size without focal abnormality. Adrenals/Urinary Tract: The right adrenal gland is minimally thickened. There is a left adrenal nodule specifically seen which previously had Hounsfield unit of less than 0 consistent with an adenoma. No enhancing renal mass or collecting system dilatation. The ureters have normal course and caliber extending down to the urinary bladder. Preserved contour to the urinary bladder. Stomach/Bowel: The stomach and small bowel are nondilated. Large bowel has a normal course and caliber with scattered colonic stool. Sigmoid  colon diverticula. There is some minimal stranding seen adjacent to the sigmoid colon laterally such as axial series 3, image 96. Please correlate with any symptoms. Vascular/Lymphatic: Aortic atherosclerosis. No enlarged abdominal or pelvic lymph nodes. Reproductive: Status post hysterectomy. No adnexal masses. Other: No free intra-air or free fluid. Gas seen tracking along the right side of the  anterior abdominal wall. Musculoskeletal: Scattered degenerative changes of the spine and pelvis. Of the there is streak artifact as the patient's arms were scanned at the patient's side. Critical Value/emergent results were called by telephone at the time of interpretation on 09/16/2023 at 11:15am to provider AMJAD ALI , who verbally acknowledged these results. IMPRESSION: Numerous right-sided rib fractures identified extending from the four hundred thirty-ninth ribs. Some of these are displaced. There is significant chest wall gas as well as a small to moderate right-sided pneumothorax. Dependent atelectasis. Trace high density fluid along the right pleural space or extrapleural region. Multiple lung nodules identified. There is at least 1 which may be new from the prior examination but still under 5 mm. Recommend follow up in 3 months. No bowel obstruction, free air or free fluid. No evidence of solid organ injury. Sigmoid colon diverticula. There is slight stranding in this location. Please correlate for any clinical symptoms of a diverticulitis. Stable left adrenal nodules consistent with adenomas. Electronically Signed   By: Karen Kays M.D.   On: 09/16/2023 11:23   DG Chest Portable 1 View Result Date: 09/16/2023 CLINICAL DATA:  Pain after fall EXAM: PORTABLE CHEST 1 VIEW COMPARISON:  X-ray 08/31/2023 FINDINGS: Right lateral chest wall gas identified with a small right apical pneumothorax. Bandlike opacity seen along the right lung base greater than left. Atelectasis favored over infiltrate but recommend follow-up. No edema. No effusion. Normal cardiopericardial silhouette. Calcified aorta. Overlapping cardiac leads. Old left clavicle fracture. IMPRESSION: Small right apical pneumothorax.  Mild lung base opacity. Chest wall gas. Please see separate CT scan of the chest, abdomen and pelvis Electronically Signed   By: Karen Kays M.D.   On: 09/16/2023 11:09      Subjective: Patient seen and examined at  bedside today.  Comfortable.  On 2 L of oxygen per minute which is her baseline.  Medically stable for discharge.  I discussed with her daughter about discharge planning.  Discharge Exam: Vitals:   10/08/23 0506 10/08/23 0740  BP: 129/63 120/75  Pulse: 60 65  Resp: 17   Temp: 98.3 F (36.8 C) 98 F (36.7 C)  SpO2: 97% 99%   Vitals:   10/07/23 1947 10/07/23 2235 10/08/23 0506 10/08/23 0740  BP: (!) 130/58  129/63 120/75  Pulse: 62 62 60 65  Resp: 20 20 17    Temp: 98.7 F (37.1 C)  98.3 F (36.8 C) 98 F (36.7 C)  TempSrc: Oral  Oral   SpO2: 95% 95% 97% 99%  Weight:      Height:        General: Pt is alert, awake, not in acute distress Cardiovascular: Mildly diminished air sounds bilaterally in the bases, no significant wheezing Respiratory: CTA bilaterally, no wheezing, no rhonchi Abdominal: Soft, NT, ND, bowel sounds + Extremities: no edema, no cyanosis    The results of significant diagnostics from this hospitalization (including imaging, microbiology, ancillary and laboratory) are listed below for reference.     Microbiology: Recent Results (from the past 240 hours)  Resp panel by RT-PCR (RSV, Flu A&B, Covid) Anterior Nasal Swab     Status: None   Collection  Time: 10/05/23  7:51 AM   Specimen: Anterior Nasal Swab  Result Value Ref Range Status   SARS Coronavirus 2 by RT PCR NEGATIVE NEGATIVE Final   Influenza A by PCR NEGATIVE NEGATIVE Final   Influenza B by PCR NEGATIVE NEGATIVE Final    Comment: (NOTE) The Xpert Xpress SARS-CoV-2/FLU/RSV plus assay is intended as an aid in the diagnosis of influenza from Nasopharyngeal swab specimens and should not be used as a sole basis for treatment. Nasal washings and aspirates are unacceptable for Xpert Xpress SARS-CoV-2/FLU/RSV testing.  Fact Sheet for Patients: BloggerCourse.com  Fact Sheet for Healthcare Providers: SeriousBroker.it  This test is not yet approved  or cleared by the [place] FDA and has been authorized for detection and/or diagnosis of SARS-CoV-2 by FDA under an Emergency Use Authorization (EUA). This EUA will remain in effect (meaning this test can be used) for the duration of the COVID-19 declaration under Section 564(b)(1) of the Act, 21 U.S.C. section 360bbb-3(b)(1), unless the authorization is terminated or revoked.     Resp Syncytial Virus by PCR NEGATIVE NEGATIVE Final    Comment: (NOTE) Fact Sheet for Patients: BloggerCourse.com  Fact Sheet for Healthcare Providers: SeriousBroker.it  This test is not yet approved or cleared by the [place] FDA and has been authorized for detection and/or diagnosis of SARS-CoV-2 by FDA under an Emergency Use Authorization (EUA). This EUA will remain in effect (meaning this test can be used) for the duration of the COVID-19 declaration under Section 564(b)(1) of the Act, 21 U.S.C. section 360bbb-3(b)(1), unless the authorization is terminated or revoked.  Performed at [facility], [address] [place], [place] [address]      Labs: BNP (last 3 results) Recent Labs    07/21/23 1755 08/31/23 2320 10/05/23 0749  BNP 182.4* 120.0* 87.1   Basic Metabolic Panel: Recent Labs  Lab 10/05/23 0749 10/06/23 0613 10/08/23 0453  NA 140 141 139  K 4.0 4.3 4.4  CL 103 105 104  CO2 28 27 28   GLUCOSE 89 181* 156*  BUN 16 20 25*  CREATININE 0.77 0.80 0.75  CALCIUM 9.1 9.3 9.0   Liver Function Tests: Recent Labs  Lab 10/06/23 0613  AST 25  ALT 44  ALKPHOS 120  BILITOT 0.4  PROT 5.9*  ALBUMIN 3.1*   No results for input(s): "LIPASE", "AMYLASE" in the last 168 hours. No results for input(s): "AMMONIA" in the last 168 hours. CBC: Recent Labs  Lab 10/05/23 0749 10/06/23 0613 10/08/23 0453  WBC 12.7* 14.3* 14.2*  NEUTROABS 7.4  --   --   HGB 11.9* 11.5* 11.6*  HCT 37.6 36.1 36.0  MCV 101.3* 99.2 98.9   PLT 570* 581* 547*   Cardiac Enzymes: No results for input(s): "CKTOTAL", "CKMB", "CKMBINDEX", "TROPONINI" in the last 168 hours. BNP: Invalid input(s): "POCBNP" CBG: Recent Labs  Lab 10/07/23 1206 10/07/23 1615 10/07/23 2118 10/08/23 0644 10/08/23 0741  GLUCAP 156* 188* 138* 188* 236*   D-Dimer Recent Labs    10/05/23 1239  DDIMER 0.60*   Hgb A1c No results for input(s): "HGBA1C" in the last 72 hours. Lipid Profile No results for input(s): "CHOL", "HDL", "LDLCALC", "TRIG", "CHOLHDL", "LDLDIRECT" in the last 72 hours. Thyroid function studies No results for input(s): "TSH", "T4TOTAL", "T3FREE", "THYROIDAB" in the last 72 hours.  Invalid input(s): "FREET3" Anemia work up No results for input(s): "VITAMINB12", "FOLATE", "FERRITIN", "TIBC", "IRON", "RETICCTPCT" in the last 72 hours. Urinalysis    Component Value Date/Time   COLORURINE  STRAW (A) 12/16/2020 1542   APPEARANCEUR CLEAR 12/16/2020 1542   LABSPEC 1.005 12/16/2020 1542   PHURINE 5.0 12/16/2020 1542   GLUCOSEU NEGATIVE 12/16/2020 1542   HGBUR NEGATIVE 12/16/2020 1542   BILIRUBINUR NEGATIVE 12/16/2020 1542   BILIRUBINUR negative 11/24/2020 1018   KETONESUR NEGATIVE 12/16/2020 1542   PROTEINUR NEGATIVE 12/16/2020 1542   UROBILINOGEN 0.2 11/24/2020 1018   UROBILINOGEN 0.2 08/09/2014 1028   NITRITE NEGATIVE 12/16/2020 1542   LEUKOCYTESUR NEGATIVE 12/16/2020 1542   Sepsis Labs Recent Labs  Lab 10/05/23 0749 10/06/23 0613 10/08/23 0453  WBC 12.7* 14.3* 14.2*   Microbiology Recent Results (from the past 240 hours)  Resp panel by RT-PCR (RSV, Flu A&B, Covid) Anterior Nasal Swab     Status: None   Collection Time: 10/05/23  7:51 AM   Specimen: Anterior Nasal Swab  Result Value Ref Range Status   SARS Coronavirus 2 by RT PCR NEGATIVE NEGATIVE Final   Influenza A by PCR NEGATIVE NEGATIVE Final   Influenza B by PCR NEGATIVE NEGATIVE Final    Comment: (NOTE) The Xpert Xpress SARS-CoV-2/FLU/RSV plus assay  is intended as an aid in the diagnosis of influenza from Nasopharyngeal swab specimens and should not be used as a sole basis for treatment. Nasal washings and aspirates are unacceptable for Xpert Xpress SARS-CoV-2/FLU/RSV testing.  Fact Sheet for Patients: BloggerCourse.com  Fact Sheet for Healthcare Providers: SeriousBroker.it  This test is not yet approved or cleared by the [place] FDA and has been authorized for detection and/or diagnosis of SARS-CoV-2 by FDA under an Emergency Use Authorization (EUA). This EUA will remain in effect (meaning this test can be used) for the duration of the COVID-19 declaration under Section 564(b)(1) of the Act, 21 U.S.C. section 360bbb-3(b)(1), unless the authorization is terminated or revoked.     Resp Syncytial Virus by PCR NEGATIVE NEGATIVE Final    Comment: (NOTE) Fact Sheet for Patients: BloggerCourse.com  Fact Sheet for Healthcare Providers: SeriousBroker.it  This test is not yet approved or cleared by the [place] FDA and has been authorized for detection and/or diagnosis of SARS-CoV-2 by FDA under an Emergency Use Authorization (EUA). This EUA will remain in effect (meaning this test can be used) for the duration of the COVID-19 declaration under Section 564(b)(1) of the Act, 21 U.S.C. section 360bbb-3(b)(1), unless the authorization is terminated or revoked.  Performed at [facility], [address] [place], [place] [address]     Please note: You were cared for by a hospitalist during your hospital stay. Once you are discharged, your primary care physician will handle any further medical issues. Please note that NO REFILLS for any discharge medications will be authorized once you are discharged, as it is imperative that you return to your primary care physician (or establish a relationship with a primary care  physician if you do not have one) for your post hospital discharge needs so that they can reassess your need for medications and monitor your lab values.    Time coordinating discharge: 40 minutes  SIGNED:   Burnadette Pop, MD  [facility] 10/08/2023, 10:17 AM Pager [phone]  If 7PM-7AM, please contact night-coverage [email or web address] Password [email or web address]

## 2023-10-09 ENCOUNTER — Telehealth: Payer: Self-pay

## 2023-10-09 ENCOUNTER — Other Ambulatory Visit (HOSPITAL_COMMUNITY): Payer: Self-pay

## 2023-10-09 ENCOUNTER — Ambulatory Visit: Attending: Internal Medicine | Admitting: Internal Medicine

## 2023-10-09 VITALS — BP 123/74 | HR 69 | Temp 98.0°F | Ht <= 58 in | Wt 158.0 lb

## 2023-10-09 DIAGNOSIS — I10 Essential (primary) hypertension: Secondary | ICD-10-CM

## 2023-10-09 DIAGNOSIS — Z09 Encounter for follow-up examination after completed treatment for conditions other than malignant neoplasm: Secondary | ICD-10-CM | POA: Diagnosis not present

## 2023-10-09 DIAGNOSIS — J449 Chronic obstructive pulmonary disease, unspecified: Secondary | ICD-10-CM

## 2023-10-09 DIAGNOSIS — F17200 Nicotine dependence, unspecified, uncomplicated: Secondary | ICD-10-CM

## 2023-10-09 DIAGNOSIS — F17210 Nicotine dependence, cigarettes, uncomplicated: Secondary | ICD-10-CM

## 2023-10-09 DIAGNOSIS — E039 Hypothyroidism, unspecified: Secondary | ICD-10-CM

## 2023-10-09 DIAGNOSIS — I2510 Atherosclerotic heart disease of native coronary artery without angina pectoris: Secondary | ICD-10-CM | POA: Diagnosis not present

## 2023-10-09 DIAGNOSIS — F319 Bipolar disorder, unspecified: Secondary | ICD-10-CM

## 2023-10-09 NOTE — Progress Notes (Signed)
 Patient ID: [name] [name], female    DOB: 1956-01-16  MRN: [id number]  CC: Hospitalization Follow-up (Hospitalization f/u./No questions / concerns/No to shingles for today)   Subjective: [name] [name] is a 68 y.o. female who presents for hospital f/u. Daughter is with her. Her concerns today include:  Patient with history of DM (A1C 6.8 08/07/2023), GERD, hypothyroidism, HTN, HL, CAD with previous stent, COPD, chronic hypoxic resp failure on 2 L O2 continuous, substance abuse, depression/anxiety and tob dep, essential thrombocytosis, iron deficiency anemia   Pt was hosp 3/23-26/2025 for COPD exacerbation. Tested neg for COVID/flu/RSV. CT angiogram neg for PE; CXR showed small BL pleural effusions. She was also hosp earlier this mth with multiple RT rib fractures and small pneumothorax post fall out of her bed. She was dischg to SNF after the pneumothorax resolved.  Today:. COPD/chronic hypoxia: feels fair.  She use using her Trelegy inhaler.  Uses DuoNeb twice a day.  Keeps albuterol inhaler in her purse to use as needed.  She uses spoke to 2 to 3 L.  Reports pulse ox range 94 when sitting, 88 when moving around with her oxygen on.  Just started prednisone taper today.  She was down to 1 cigarette a day SNF but has had to increase to 3 a day.  HTN/CAD: She has not had any chest pains or lower extremity edema. Compliant with taking Brilinta, ASA 81 mg, Cozaar 50 mg, amlodipine 5 mg daily, atorvastatin 80 mg daily, Zetia 10 mg daily, furosemide 20 mg daily and nebivolol 10 mg daily. Hypothyroid: Taking levothyroxine 112 mcg daily consistently. Bipolar/MDD/GAD: Plugged in with behavioral health.  She is on clonazepam 0.5 mg 3 times a day as needed, Remeron 45 mg at bedtime, Lexapro 20 mg daily, doxepin 75 mg at bedtime BuSpar Patient Active Problem List   Diagnosis Date Noted   Acute on chronic hypoxic respiratory failure (HCC) 10/05/2023   Pneumothorax, right 09/16/2023   COPD with acute  exacerbation (HCC) 09/01/2023   Chronic hypoxic respiratory failure (HCC) 09/01/2023   Continuous dependence on cigarette smoking 09/01/2023   History of CAD (coronary artery disease) 09/01/2023   CAP (community acquired pneumonia) 09/01/2023   Infection due to parainfluenza virus 4 06/20/2023   Full code status 06/14/2023   Acute hypoxic respiratory failure (HCC) 06/13/2023   Acute exacerbation of chronic obstructive pulmonary disease (COPD) (HCC) 06/12/2023   Nicotine dependence 05/03/2023   Substance abuse (HCC) 09/10/2022   Iron deficiency anemia 11/02/2021   Major depressive disorder, recurrent, in full remission with anxious distress (HCC) 05/31/2021   Chronic neck pain 02/26/2021   GAD (generalized anxiety disorder) 01/08/2021   Esophageal dysphagia 04/28/2019   Bipolar I disorder (HCC) 09/17/2018   Insomnia 10/28/2017   Prediabetes 05/28/2017   QT prolongation 03/25/2017   Psoriasis 06/22/2015   COPD (chronic obstructive pulmonary disease) (HCC)GOLD E 05/18/2015   Anxiety and depression 07/10/2013   Essential hypertension 06/07/2013   Hyperlipidemia 06/07/2013   Asthma, chronic 06/07/2013   Emphysema lung (HCC) 06/07/2013   Chronic back pain 06/07/2013   CAD S/P percutaneous coronary angioplasty 06/07/2013   GERD (gastroesophageal reflux disease) 06/07/2013   Breast lump on left side at 3 o'clock position 10/13/2012   S/P abdominal hysterectomy and right salpingo-oophorectomy 08/29/2011   COPD exacerbation (HCC) 09/15/2009   TOBACCO ABUSE 07/17/2009   Chronic rhinitis 07/17/2009   Lung nodule < 6cm on CT 07/17/2009   Hypothyroidism 12/22/2008   Chronic depression 12/22/2008     Current Outpatient  Medications on File Prior to Visit  Medication Sig Dispense Refill   acetaminophen (TYLENOL) 500 MG tablet Take 2 tablets (1,000 mg total) by mouth every 6 (six) hours. (Patient taking differently: Take 1,000 mg by mouth every 6 (six) hours as needed for mild pain (pain  score 1-3) or moderate pain (pain score 4-6).)     alendronate (FOSAMAX) 35 MG tablet Take 1 tablet (35 mg total) by mouth every 7 (seven) days. Take with a full glass of water on an empty stomach. Sit upright for 30-60 mins after taking 12 tablet 1   amLODipine (NORVASC) 5 MG tablet Take 1 tablet (5 mg total) by mouth daily. (Patient taking differently: Take 5 mg by mouth in the morning.) 90 tablet 3   aspirin EC 81 MG tablet Take 81 mg by mouth in the morning.     atorvastatin (LIPITOR) 80 MG tablet Take 1 tablet (80 mg total) by mouth daily. (Patient taking differently: Take 80 mg by mouth in the morning.) 90 tablet 3   Blood Glucose Monitoring Suppl (ACCU-CHEK GUIDE) w/Device KIT Use to check blood sugars once a day 1 kit 0   busPIRone (BUSPAR) 15 MG tablet Take 2 tablets (30 mg total) by mouth in the morning AND 1 tablet (15 mg total) daily at 12 noon AND 1 tablet (15 mg total) every evening. (Patient taking differently: Take 2 tablets (30 mg total) by mouth in the morning and 2 tablets (30 mg total) every night.) 120 tablet 1   clonazePAM (KLONOPIN) 0.5 MG tablet Take 1 tablet (0.5 mg total) by mouth 3 (three) times daily as needed for anxiety. (Patient taking differently: Take 0.5 mg by mouth in the morning and at bedtime.) 90 tablet 0   docusate sodium (COLACE) 100 MG capsule Take 1 capsule (100 mg total) by mouth 2 (two) times daily as needed for mild constipation.     doxepin (SINEQUAN) 75 MG capsule Take 1 capsule (75 mg total) by mouth at bedtime. 30 capsule 1   escitalopram (LEXAPRO) 20 MG tablet Take 1 tablet (20 mg total) by mouth daily. (Patient taking differently: Take 20 mg by mouth at bedtime.) 30 tablet 1   ezetimibe (ZETIA) 10 MG tablet Take 1 tablet (10 mg total) by mouth daily. (Patient taking differently: Take 10 mg by mouth at bedtime.) 90 tablet 3   Fluticasone-Umeclidin-Vilant (TRELEGY ELLIPTA) 200-62.5-25 MCG/ACT AEPB Inhale 1 puff into the lungs daily. (Patient taking  differently: Inhale 1 puff into the lungs in the morning.) 60 each 11   furosemide (LASIX) 20 MG tablet Take 1 tablet (20 mg total) by mouth daily. (Patient taking differently: Take 20 mg by mouth in the morning.) 90 tablet 3   gabapentin (NEURONTIN) 600 MG tablet TAKE 1 TABLET BY MOUTH 4 TIMES DAILY (Patient taking differently: Take 600 mg by mouth 2 (two) times daily.) 360 tablet 2   glucose blood (TRUE METRIX BLOOD GLUCOSE TEST) test strip Use as instructed to check blood sugars once a day 100 each 12   guaiFENesin (MUCINEX) 600 MG 12 hr tablet Take 1 tablet (600 mg total) by mouth 2 (two) times daily for 3 days. 6 tablet 0   ipratropium-albuterol (DUONEB) 0.5-2.5 (3) MG/3ML SOLN Take 3 mLs by nebulization every 6 (six) hours as needed. (Patient taking differently: Take 3 mLs by nebulization every 8 (eight) hours.) 360 mL 0   levothyroxine (SYNTHROID) 112 MCG tablet Take 1 tablet (112 mcg total) by mouth daily. (Patient taking differently: Take 112  mcg by mouth daily before breakfast.) 90 tablet 2   lidocaine (LIDODERM) 5 % Place 1 patch onto the skin daily. Remove & Discard patch within 12 hours or as directed by MD     losartan (COZAAR) 50 MG tablet Take 1.5 tablets (75 mg total) by mouth daily. (Patient taking differently: Take 75 mg by mouth in the morning.) 135 tablet 3   metFORMIN (GLUCOPHAGE) 500 MG tablet Take 1 tablet (500 mg total) by mouth 2 (two) times daily with a meal. (Patient taking differently: Take 1 tablet (500 mg total) by mouth 2 (two) times daily with a meal.) 180 tablet 1   mirtazapine (REMERON) 45 MG tablet Take 1 tablet (45 mg total) by mouth at bedtime. 30 tablet 1   montelukast (SINGULAIR) 10 MG tablet Take 1 tablet (10 mg total) by mouth at bedtime. 90 tablet 2   nebivolol (BYSTOLIC) 10 MG tablet Take 1 tablet (10 mg total) by mouth daily. (Patient taking differently: Take 10 mg by mouth at bedtime.) 90 tablet 3   nicotine (NICODERM CQ - DOSED IN MG/24 HOURS) 14 mg/24hr  patch Place 1 patch (14 mg total) onto the skin daily. 28 patch 0   nitroGLYCERIN (NITROSTAT) 0.4 MG SL tablet Place 1 tablet (0.4 mg total) under the tongue every 5 (five) minutes as needed for chest pain. 25 tablet 3   omeprazole (PRILOSEC) 40 MG capsule Take 1 capsule (40 mg total) by mouth 2 (two) times daily before a meal. 60 capsule 1   polyethylene glycol (MIRALAX / GLYCOLAX) 17 g packet Take 17 g by mouth daily as needed for severe constipation.     predniSONE (DELTASONE) 10 MG tablet Take 4 tablets (40mg ) daily for 2 days, then 3 tablets (30mg ) daily for 2 days, then 2 tablets (20mg ) daily for 2 days, then 1 tablet (10mg ) daily for 2 days, then stop 20 tablet 0   Spacer/Aero-Holding [facility] Use with the albuterol inhaler. 1 each 0   ticagrelor (BRILINTA) 60 MG TABS tablet Take 1 tablet (60 mg total) by mouth 2 (two) times daily. 60 tablet 52   TRUEplus Lancets 28G MISC Use to check blood sugars once a day 100 each 2   VENTOLIN HFA 108 (90 Base) MCG/ACT inhaler INHALE 2 PUFFS into THE lungs EVERY 6 HOURS AS NEEDED FOR wheezing OR SHORTNESS OF BREATH 18 g 10   No current facility-administered medications on file prior to visit.    Allergies  Allergen Reactions   Avocado Anaphylaxis   Latex Shortness Of Breath and Rash   Codeine Nausea Only    Social History   Socioeconomic History   Marital status: Divorced    Spouse name: Not on file   Number of children: 1   Years of education: Not on file   Highest education level: 9th grade  Occupational History   Not on file  Tobacco Use   Smoking status: Every Day    Current packs/day: 0.50    Average packs/day: 0.5 packs/day for 50.0 years (25.0 ttl pk-yrs)    Types: Cigarettes   Smokeless tobacco: Never  Vaping Use   Vaping status: Some Days   Substances: Nicotine  Substance and Sexual Activity   Alcohol use: Yes    Comment: once a week   Drug use: Yes    Types: Marijuana    Comment: history of cocaine use, been about  a year, per patient (12/18/21)   Sexual activity: Not Currently    Birth control/protection: Surgical  Other Topics Concern   Not on file  Social History Narrative   Not on file   Social Drivers of Health   Financial Resource Strain: Medium Risk (08/07/2023)   Overall Financial Resource Strain (CARDIA)    Difficulty of Paying Living Expenses: Somewhat hard  Food Insecurity: No Food Insecurity (10/05/2023)   Hunger Vital Sign    Worried About Running Out of Food in the Last Year: Never true    Ran Out of Food in the Last Year: Never true  Recent Concern: Food Insecurity - Food Insecurity Present (08/07/2023)   Hunger Vital Sign    Worried About Running Out of Food in the Last Year: Sometimes true    Ran Out of Food in the Last Year: Sometimes true  Transportation Needs: No Transportation Needs (10/05/2023)   PRAPARE - Administrator, Civil Service (Medical): No    Lack of Transportation (Non-Medical): No  Physical Activity: Inactive (08/07/2023)   Exercise Vital Sign    Days of Exercise per Week: 0 days    Minutes of Exercise per Session: 0 min  Stress: No Stress Concern Present (08/07/2023)   [employer] of Occupational Health - Occupational Stress Questionnaire    Feeling of Stress : Only a little  Social Connections: Unknown (10/05/2023)   Social Connection and Isolation Panel [NHANES]    Frequency of Communication with Friends and Family: More than three times a week    Frequency of Social Gatherings with Friends and Family: Once a week    Attends Religious Services: Patient declined    Database administrator or Organizations: No    Attends Engineer, structural: Never    Marital Status: Divorced  Recent Concern: Social Connections - Moderately Isolated (09/16/2023)   Social Connection and Isolation Panel [NHANES]    Frequency of Communication with Friends and Family: More than three times a week    Frequency of Social Gatherings with Friends and Family:  Twice a week    Attends Religious Services: 1 to 4 times per year    Active Member of [employer] or Organizations: No    Attends Banker Meetings: Never    Marital Status: Divorced  Catering manager Violence: Not At Risk (10/05/2023)   Humiliation, Afraid, Rape, and Kick questionnaire    Fear of Current or Ex-Partner: No    Emotionally Abused: No    Physically Abused: No    Sexually Abused: No    Family History  Problem Relation Age of Onset   Heart disease Mother    Hypertension Mother    Stroke Mother    Mental illness Mother    Heart disease Father    Hypertension Father    Diabetes Father    Colon polyps Brother    Heart disease Maternal Grandfather    Cancer Paternal Grandmother        mouth   Anesthesia problems Daughter    Breast cancer Maternal Aunt    Throat cancer Maternal Uncle    Thyroid disease Paternal Aunt    Hypotension Neg Hx    Malignant hyperthermia Neg Hx    Pseudochol deficiency Neg Hx    Colon cancer Neg Hx    Stomach cancer Neg Hx    Esophageal cancer Neg Hx    Pancreatic cancer Neg Hx    Rectal cancer Neg Hx     Past Surgical History:  Procedure Laterality Date   ABDOMINAL EXPLORATION SURGERY  1977   ABDOMINAL HYSTERECTOMY  1977   "left one of my ovaries"   BACK SURGERY     BIOPSY  05/27/2019   Procedure: BIOPSY;  Surgeon: Corbin Ade, MD;  Location: [facility];  Service: Endoscopy;;   BREAST BIOPSY Left 11/2021   x2   COLONOSCOPY  2011   Dr. Christella Hartigan: Mild diverticulosis, descending diminutive colon polyp (not retrieved), next colonoscopy 10 years   CORONARY ANGIOPLASTY WITH STENT PLACEMENT  2011 X2   "regular stents didn't work; had to go back in in ~ 1 month and put in medicated stents"   DILATION AND CURETTAGE OF UTERUS     ESOPHAGOGASTRODUODENOSCOPY     ESOPHAGOGASTRODUODENOSCOPY (EGD) WITH PROPOFOL N/A 05/27/2019   normal esophagus, dilation, erosive gastropathy s/p biopsy, normal duodenum. Negative H.pylori.    LEFT  HEART CATH AND CORONARY ANGIOGRAPHY N/A 03/26/2017   Procedure: LEFT HEART CATH AND CORONARY ANGIOGRAPHY;  Surgeon: Lyn Records, MD;  Location: [facility] IN[facility];  Service: Cardiovascular;  Laterality: N/A;   LUMBAR LAMINECTOMY/DECOMPRESSION MICRODISCECTOMY  08/05/2011   Procedure: LUMBAR LAMINECTOMY/DECOMPRESSION MICRODISCECTOMY;  Surgeon: Clydene Fake, MD;  Location: [facility] NEURO ORS;  Service: Neurosurgery;  Laterality: Right;  Right Lumbar four-five extraforaminal discectomy   MALONEY DILATION N/A 05/27/2019   Procedure: Elease Hashimoto DILATION;  Surgeon: Corbin Ade, MD;  Location: [facility];  Service: Endoscopy;  Laterality: N/A;  54   SHOULDER ARTHROSCOPY WITH ROTATOR CUFF REPAIR AND OPEN BICEPS TENODESIS Right 12/24/2021   Procedure: RIGHT SHOULDER ARTHROSCOPY WITH ROTATOR CUFF REPAIR AND BICEPS TENODESIS;  Surgeon: Eldred Manges, MD;  Location: [facility] OR;  Service: Orthopedics;  Laterality: Right;   TONSILLECTOMY     as a child   UPPER GASTROINTESTINAL ENDOSCOPY      ROS: Review of Systems Negative except as stated above  PHYSICAL EXAM: BP 123/74 (BP Location: Left Arm, Patient Position: Sitting, Cuff Size: Normal)   Pulse 69   Temp 98 F (36.7 C) (Oral)   Ht 4\' 9"  (1.448 m)   Wt 158 lb (71.7 kg)   BMI 34.19 kg/m   Physical Exam Pt on 3L O2 General appearance - older caucasian FM in NAD Mental status - normal mood, behavior, speech, dress, motor activity, and thought processes Neck - supple, no significant adenopathy Chest - breath sounds moderately decreased BL, equal BL Heart - normal rate, regular rhythm, normal S1, S2, no murmurs, rubs, clicks or gallops Extremities - no LE edema     10/09/2023    4:14 PM 09/26/2023    4:41 PM 08/07/2023   10:22 AM  Depression screen PHQ 2/9  Decreased Interest 3  3  Down, Depressed, Hopeless 2  0  PHQ - 2 Score 5  3  Altered sleeping 3  2  Tired, decreased energy 3  2  Change in appetite 0  0  Feeling bad or failure about  yourself  0  0  Trouble concentrating 2  0  Moving slowly or fidgety/restless 1  0  Suicidal thoughts 0  0  PHQ-9 Score 14  7  Difficult doing work/chores Extremely dIfficult  Not difficult at all     Information is confidential and restricted. Go to Review Flowsheets to unlock data.       Latest Ref Rng & Units 10/08/2023    4:53 AM 10/06/2023    6:13 AM 10/05/2023    7:49 AM  CMP  Glucose 70 - 99 mg/dL 010  272  89   BUN 8 - 23 mg/dL  25  20  16    Creatinine 0.44 - 1.00 mg/dL 1.61  0.96  0.45   Sodium 135 - 145 mmol/L 139  141  140   Potassium 3.5 - 5.1 mmol/L 4.4  4.3  4.0   Chloride 98 - 111 mmol/L 104  105  103   CO2 22 - 32 mmol/L 28  27  28    Calcium 8.9 - 10.3 mg/dL 9.0  9.3  9.1   Total Protein 6.5 - 8.1 g/dL  5.9    Total Bilirubin 0.0 - 1.2 mg/dL  0.4    Alkaline Phos 38 - 126 U/L  120    AST 15 - 41 U/L  25    ALT 0 - 44 U/L  44     Lipid Panel     Component Value Date/Time   CHOL 192 06/26/2023 1339   TRIG 168 (H) 06/26/2023 1339   HDL 71 06/26/2023 1339   CHOLHDL 2.7 06/26/2023 1339   CHOLHDL 3.8 04/16/2016 0934   VLDL 15 04/16/2016 0934   LDLCALC 93 06/26/2023 1339    CBC    Component Value Date/Time   WBC 14.2 (H) 10/08/2023 0453   RBC 3.64 (L) 10/08/2023 0453   HGB 11.6 (L) 10/08/2023 0453   HGB 14.8 05/20/2023 1119   HCT 36.0 10/08/2023 0453   HCT 44.2 05/20/2023 1119   PLT 547 (H) 10/08/2023 0453   PLT 373 05/20/2023 1119   MCV 98.9 10/08/2023 0453   MCV 99 (H) 05/20/2023 1119   MCH 31.9 10/08/2023 0453   MCHC 32.2 10/08/2023 0453   RDW 15.7 (H) 10/08/2023 0453   RDW 13.2 05/20/2023 1119   LYMPHSABS 3.5 10/05/2023 0749   LYMPHSABS 2.6 04/05/2019 0926   MONOABS 1.4 (H) 10/05/2023 0749   EOSABS 0.2 10/05/2023 0749   EOSABS 0.3 04/05/2019 0926   BASOSABS 0.1 10/05/2023 0749   BASOSABS 0.1 04/05/2019 0926    ASSESSMENT AND PLAN: 1. Hospital discharge follow-up (Primary)  2. Chronic obstructive pulmonary disease, unspecified COPD type  (HCC) Severe and on O2 continuous. Continue trelegy inh and Duonebs.  Try to quit smoking  3. Tobacco use disorder Strongly advised to quit. Commended her on cutting back  4. Essential hypertension At goal.  Nebivolol 10 mg daily, zotepine 5 mg daily, Cozaar 50 mg furosemide  5. Coronary artery disease involving native coronary artery of native heart without angina pectoris Stable.  Continue nebivolol 10 mg daily, aspirin, Brilinta, Zetia, atorvastatin 80 mg.  6. Acquired hypothyroidism Continue Levothyroxine 112 mcg daily.  Check TSH today - TSH; Future 7. Bipolar I disorder (HCC) Plugged in with [facility]   Patient was given the opportunity to ask questions.  Patient verbalized understanding of the plan and was able to repeat key elements of the plan.   This documentation was completed using Paediatric nurse.  Any transcriptional errors are unintentional.  Orders Placed This Encounter  Procedures   TSH     Requested Prescriptions    No prescriptions requested or ordered in this encounter    Return in about 2 months (around 12/09/2023).  Jonah Blue, MD, FACP

## 2023-10-09 NOTE — Transitions of Care (Post Inpatient/ED Visit) (Signed)
   10/09/2023  Name: [name] [name] MRN: [id number] DOB: 11/27/1955  Today's TOC FU Call Status: Today's TOC FU Call Status:: Unsuccessful Call (1st Attempt) Unsuccessful Call (1st Attempt) Date: 10/09/23  Attempted to reach the patient regarding the most recent Inpatient/ED visit.  Follow Up Plan: Additional outreach attempts will be made to reach the patient to complete the Transitions of Care (Post Inpatient/ED visit) call.   Bryer Cozzolino A. Mliss Fritz RN, BA, [facility], CRRN [place]  [facility] RN Care Manager, Transition of Care [phone]

## 2023-10-10 ENCOUNTER — Telehealth: Payer: Self-pay

## 2023-10-10 NOTE — Transitions of Care (Post Inpatient/ED Visit) (Signed)
   10/10/2023  Name: BRITANIE HARSHMAN MRN: [id number] DOB: [birth year]  Today's TOC FU Call Status: Today's TOC FU Call Status:: Unsuccessful Call (2nd Attempt) Unsuccessful Call (2nd Attempt) Date: 10/10/23  Attempted to reach the patient regarding the most recent Inpatient/ED visit.  Follow Up Plan: Additional outreach attempts will be made to reach the patient to complete the Transitions of Care (Post Inpatient/ED visit) call.   Ozzie Knobel A. Mliss Fritz RN, BA, [facility], CRRN [facility] RN Care Manager, Transition of Care [phone]

## 2023-10-11 ENCOUNTER — Encounter: Payer: Self-pay | Admitting: Internal Medicine

## 2023-10-13 ENCOUNTER — Telehealth: Payer: Self-pay

## 2023-10-13 NOTE — Transitions of Care (Post Inpatient/ED Visit) (Signed)
   10/13/2023  Name: [name] [name] MRN: [id number] DOB: 1956-03-22  Today's TOC FU Call Status: Today's TOC FU Call Status:: Unsuccessful Call (3rd Attempt) Unsuccessful Call (3rd Attempt) Date: 10/13/23  Attempted to reach the patient regarding the most recent Inpatient/ED visit.  Follow Up Plan: No further outreach attempts will be made at this time. We have been unable to contact the patient.  Nalin Mazzocco A. Mliss Fritz RN, BA, [facility], CRRN [place]  [facility] RN Care Manager, Transition of Care [phone]

## 2023-10-15 ENCOUNTER — Other Ambulatory Visit: Payer: Self-pay | Admitting: Internal Medicine

## 2023-10-15 DIAGNOSIS — J4489 Other specified chronic obstructive pulmonary disease: Secondary | ICD-10-CM

## 2023-10-16 ENCOUNTER — Inpatient Hospital Stay: Payer: Medicare HMO | Admitting: Adult Health

## 2023-10-23 ENCOUNTER — Other Ambulatory Visit (HOSPITAL_COMMUNITY): Payer: Self-pay

## 2023-10-23 ENCOUNTER — Other Ambulatory Visit: Payer: Self-pay

## 2023-10-24 ENCOUNTER — Ambulatory Visit (HOSPITAL_COMMUNITY)
Admission: RE | Admit: 2023-10-24 | Discharge: 2023-10-24 | Disposition: A | Discharge status: Home / Self Care | Source: Ambulatory Visit | Attending: General Surgery | Admitting: General Surgery

## 2023-10-24 ENCOUNTER — Encounter: Payer: Self-pay | Admitting: Hematology

## 2023-10-24 ENCOUNTER — Encounter: Payer: Self-pay | Admitting: Internal Medicine

## 2023-10-24 ENCOUNTER — Ambulatory Visit: Attending: Internal Medicine

## 2023-10-24 DIAGNOSIS — F319 Bipolar disorder, unspecified: Secondary | ICD-10-CM | POA: Diagnosis not present

## 2023-10-24 DIAGNOSIS — J90 Pleural effusion, not elsewhere classified: Secondary | ICD-10-CM | POA: Diagnosis not present

## 2023-10-24 DIAGNOSIS — J939 Pneumothorax, unspecified: Secondary | ICD-10-CM | POA: Insufficient documentation

## 2023-10-24 DIAGNOSIS — J9811 Atelectasis: Secondary | ICD-10-CM | POA: Diagnosis not present

## 2023-10-24 DIAGNOSIS — R911 Solitary pulmonary nodule: Secondary | ICD-10-CM | POA: Diagnosis not present

## 2023-10-25 ENCOUNTER — Encounter: Payer: Self-pay | Admitting: Internal Medicine

## 2023-10-25 ENCOUNTER — Other Ambulatory Visit (HOSPITAL_BASED_OUTPATIENT_CLINIC_OR_DEPARTMENT_OTHER): Payer: Self-pay

## 2023-10-25 ENCOUNTER — Other Ambulatory Visit (HOSPITAL_COMMUNITY): Payer: Self-pay

## 2023-10-25 ENCOUNTER — Other Ambulatory Visit: Payer: Self-pay | Admitting: Internal Medicine

## 2023-10-25 DIAGNOSIS — M85859 Other specified disorders of bone density and structure, unspecified thigh: Secondary | ICD-10-CM

## 2023-10-25 DIAGNOSIS — E039 Hypothyroidism, unspecified: Secondary | ICD-10-CM

## 2023-10-25 LAB — TSH: TSH: 11.1 u[IU]/mL — ABNORMAL HIGH (ref 0.450–4.500)

## 2023-10-25 MED ORDER — ALENDRONATE SODIUM 70 MG PO TABS
70.0000 mg | ORAL_TABLET | ORAL | 1 refills | Status: DC
  Filled 2023-10-25: qty 12, 84d supply, fill #0
  Filled 2024-01-12: qty 12, 84d supply, fill #1

## 2023-10-27 ENCOUNTER — Encounter (HOSPITAL_COMMUNITY): Payer: Self-pay

## 2023-10-27 ENCOUNTER — Other Ambulatory Visit (HOSPITAL_COMMUNITY): Payer: Self-pay

## 2023-10-27 ENCOUNTER — Encounter: Payer: Self-pay | Admitting: Internal Medicine

## 2023-10-29 ENCOUNTER — Other Ambulatory Visit (HOSPITAL_COMMUNITY): Payer: Self-pay

## 2023-10-29 ENCOUNTER — Other Ambulatory Visit: Payer: Self-pay

## 2023-10-31 ENCOUNTER — Encounter: Payer: Self-pay | Admitting: Hematology

## 2023-10-31 ENCOUNTER — Other Ambulatory Visit (HOSPITAL_COMMUNITY): Payer: Self-pay

## 2023-11-01 ENCOUNTER — Other Ambulatory Visit (HOSPITAL_COMMUNITY): Payer: Self-pay

## 2023-11-05 ENCOUNTER — Ambulatory Visit: Payer: Medicare HMO | Admitting: Pulmonary Disease

## 2023-11-06 ENCOUNTER — Encounter (HOSPITAL_COMMUNITY): Payer: Self-pay

## 2023-11-06 ENCOUNTER — Other Ambulatory Visit: Payer: Self-pay

## 2023-11-06 ENCOUNTER — Other Ambulatory Visit: Payer: Self-pay | Admitting: Physician Assistant

## 2023-11-06 ENCOUNTER — Other Ambulatory Visit (HOSPITAL_COMMUNITY): Payer: Self-pay

## 2023-11-06 ENCOUNTER — Other Ambulatory Visit: Payer: Self-pay | Admitting: *Deleted

## 2023-11-06 DIAGNOSIS — Z87891 Personal history of nicotine dependence: Secondary | ICD-10-CM

## 2023-11-06 DIAGNOSIS — K219 Gastro-esophageal reflux disease without esophagitis: Secondary | ICD-10-CM

## 2023-11-06 DIAGNOSIS — R911 Solitary pulmonary nodule: Secondary | ICD-10-CM

## 2023-11-06 MED ORDER — OMEPRAZOLE 40 MG PO CPDR
40.0000 mg | DELAYED_RELEASE_CAPSULE | Freq: Two times a day (BID) | ORAL | 1 refills | Status: DC
  Filled 2023-11-06: qty 60, 30d supply, fill #0
  Filled 2023-12-01: qty 60, 30d supply, fill #1

## 2023-11-07 ENCOUNTER — Other Ambulatory Visit (HOSPITAL_COMMUNITY): Payer: Self-pay

## 2023-11-07 ENCOUNTER — Encounter (HOSPITAL_COMMUNITY): Payer: Self-pay

## 2023-11-07 ENCOUNTER — Telehealth (HOSPITAL_COMMUNITY): Admitting: Physician Assistant

## 2023-11-10 ENCOUNTER — Telehealth

## 2023-11-10 ENCOUNTER — Other Ambulatory Visit: Payer: Self-pay | Admitting: Internal Medicine

## 2023-11-10 DIAGNOSIS — E1165 Type 2 diabetes mellitus with hyperglycemia: Secondary | ICD-10-CM

## 2023-11-11 ENCOUNTER — Other Ambulatory Visit: Payer: Self-pay

## 2023-11-11 ENCOUNTER — Ambulatory Visit: Attending: Internal Medicine | Admitting: Internal Medicine

## 2023-11-11 ENCOUNTER — Other Ambulatory Visit: Payer: Self-pay | Admitting: Nurse Practitioner

## 2023-11-11 ENCOUNTER — Encounter: Payer: Self-pay | Admitting: Internal Medicine

## 2023-11-11 ENCOUNTER — Emergency Department (HOSPITAL_COMMUNITY)

## 2023-11-11 ENCOUNTER — Emergency Department (HOSPITAL_COMMUNITY)
Admission: EM | Admit: 2023-11-11 | Discharge: 2023-11-12 | Disposition: A | Discharge status: Home / Self Care | Attending: Emergency Medicine | Admitting: Emergency Medicine

## 2023-11-11 VITALS — Ht <= 58 in | Wt 154.0 lb

## 2023-11-11 DIAGNOSIS — Z7951 Long term (current) use of inhaled steroids: Secondary | ICD-10-CM | POA: Insufficient documentation

## 2023-11-11 DIAGNOSIS — I10 Essential (primary) hypertension: Secondary | ICD-10-CM

## 2023-11-11 DIAGNOSIS — I110 Hypertensive heart disease with heart failure: Secondary | ICD-10-CM | POA: Insufficient documentation

## 2023-11-11 DIAGNOSIS — Z91040 Latex allergy status: Secondary | ICD-10-CM | POA: Diagnosis not present

## 2023-11-11 DIAGNOSIS — R42 Dizziness and giddiness: Secondary | ICD-10-CM

## 2023-11-11 DIAGNOSIS — R1011 Right upper quadrant pain: Secondary | ICD-10-CM | POA: Diagnosis present

## 2023-11-11 DIAGNOSIS — F319 Bipolar disorder, unspecified: Secondary | ICD-10-CM | POA: Diagnosis present

## 2023-11-11 DIAGNOSIS — R0781 Pleurodynia: Secondary | ICD-10-CM | POA: Diagnosis not present

## 2023-11-11 DIAGNOSIS — Z79899 Other long term (current) drug therapy: Secondary | ICD-10-CM | POA: Diagnosis not present

## 2023-11-11 DIAGNOSIS — A419 Sepsis, unspecified organism: Secondary | ICD-10-CM

## 2023-11-11 DIAGNOSIS — R109 Unspecified abdominal pain: Secondary | ICD-10-CM

## 2023-11-11 DIAGNOSIS — F17210 Nicotine dependence, cigarettes, uncomplicated: Secondary | ICD-10-CM | POA: Diagnosis present

## 2023-11-11 DIAGNOSIS — J9611 Chronic respiratory failure with hypoxia: Secondary | ICD-10-CM | POA: Diagnosis present

## 2023-11-11 DIAGNOSIS — R918 Other nonspecific abnormal finding of lung field: Secondary | ICD-10-CM | POA: Diagnosis not present

## 2023-11-11 DIAGNOSIS — E860 Dehydration: Secondary | ICD-10-CM | POA: Diagnosis not present

## 2023-11-11 DIAGNOSIS — Z7982 Long term (current) use of aspirin: Secondary | ICD-10-CM | POA: Insufficient documentation

## 2023-11-11 DIAGNOSIS — D509 Iron deficiency anemia, unspecified: Secondary | ICD-10-CM | POA: Diagnosis present

## 2023-11-11 DIAGNOSIS — K5732 Diverticulitis of large intestine without perforation or abscess without bleeding: Secondary | ICD-10-CM | POA: Diagnosis not present

## 2023-11-11 DIAGNOSIS — R1031 Right lower quadrant pain: Secondary | ICD-10-CM | POA: Diagnosis not present

## 2023-11-11 DIAGNOSIS — J449 Chronic obstructive pulmonary disease, unspecified: Secondary | ICD-10-CM | POA: Diagnosis present

## 2023-11-11 DIAGNOSIS — F411 Generalized anxiety disorder: Secondary | ICD-10-CM | POA: Diagnosis present

## 2023-11-11 DIAGNOSIS — N182 Chronic kidney disease, stage 2 (mild): Secondary | ICD-10-CM | POA: Insufficient documentation

## 2023-11-11 DIAGNOSIS — N179 Acute kidney failure, unspecified: Secondary | ICD-10-CM

## 2023-11-11 DIAGNOSIS — I509 Heart failure, unspecified: Secondary | ICD-10-CM | POA: Diagnosis not present

## 2023-11-11 DIAGNOSIS — N189 Chronic kidney disease, unspecified: Secondary | ICD-10-CM

## 2023-11-11 DIAGNOSIS — Z8679 Personal history of other diseases of the circulatory system: Secondary | ICD-10-CM

## 2023-11-11 DIAGNOSIS — I700 Atherosclerosis of aorta: Secondary | ICD-10-CM | POA: Diagnosis not present

## 2023-11-11 DIAGNOSIS — I2510 Atherosclerotic heart disease of native coronary artery without angina pectoris: Secondary | ICD-10-CM

## 2023-11-11 DIAGNOSIS — J189 Pneumonia, unspecified organism: Secondary | ICD-10-CM

## 2023-11-11 LAB — CBC
HCT: 41.8 % (ref 36.0–46.0)
Hemoglobin: 13.1 g/dL (ref 12.0–15.0)
MCH: 31.3 pg (ref 26.0–34.0)
MCHC: 31.3 g/dL (ref 30.0–36.0)
MCV: 100 fL (ref 80.0–100.0)
Platelets: 523 10*3/uL — ABNORMAL HIGH (ref 150–400)
RBC: 4.18 MIL/uL (ref 3.87–5.11)
RDW: 15.3 % (ref 11.5–15.5)
WBC: 12.4 10*3/uL — ABNORMAL HIGH (ref 4.0–10.5)
nRBC: 0 % (ref 0.0–0.2)

## 2023-11-11 LAB — COMPREHENSIVE METABOLIC PANEL WITH GFR
ALT: 14 U/L (ref 0–44)
AST: 15 U/L (ref 15–41)
Albumin: 3.6 g/dL (ref 3.5–5.0)
Alkaline Phosphatase: 76 U/L (ref 38–126)
Anion gap: 11 (ref 5–15)
BUN: 21 mg/dL (ref 8–23)
CO2: 26 mmol/L (ref 22–32)
Calcium: 8.9 mg/dL (ref 8.9–10.3)
Chloride: 101 mmol/L (ref 98–111)
Creatinine, Ser: 1.47 mg/dL — ABNORMAL HIGH (ref 0.44–1.00)
GFR, Estimated: 39 mL/min — ABNORMAL LOW (ref >60)
Glucose, Bld: 96 mg/dL (ref 70–99)
Potassium: 3.9 mmol/L (ref 3.5–5.1)
Sodium: 138 mmol/L (ref 135–145)
Total Bilirubin: 0.5 mg/dL (ref 0.0–1.2)
Total Protein: 6.4 g/dL — ABNORMAL LOW (ref 6.5–8.1)

## 2023-11-11 LAB — URINALYSIS, ROUTINE W REFLEX MICROSCOPIC
Bilirubin Urine: NEGATIVE
Glucose, UA: NEGATIVE mg/dL
Hgb urine dipstick: NEGATIVE
Ketones, ur: NEGATIVE mg/dL
Leukocytes,Ua: NEGATIVE
Nitrite: NEGATIVE
Protein, ur: NEGATIVE mg/dL
Specific Gravity, Urine: 1.009 (ref 1.005–1.030)
pH: 5 (ref 5.0–8.0)

## 2023-11-11 LAB — LIPASE, BLOOD: Lipase: 33 U/L (ref 11–51)

## 2023-11-11 MED ORDER — MONTELUKAST SODIUM 10 MG PO TABS
10.0000 mg | ORAL_TABLET | Freq: Every day | ORAL | 2 refills | Status: AC
  Filled 2023-11-11 – 2023-12-02 (×2): qty 90, 90d supply, fill #0
  Filled 2024-02-26: qty 90, 90d supply, fill #1
  Filled 2024-07-17: qty 90, 90d supply, fill #2

## 2023-11-11 MED ORDER — SODIUM CHLORIDE 0.9 % IV BOLUS
500.0000 mL | Freq: Once | INTRAVENOUS | Status: AC
  Administered 2023-11-11: 500 mL via INTRAVENOUS

## 2023-11-11 MED ORDER — IPRATROPIUM-ALBUTEROL 0.5-2.5 (3) MG/3ML IN SOLN
3.0000 mL | Freq: Three times a day (TID) | RESPIRATORY_TRACT | 4 refills | Status: DC
  Filled 2023-11-11: qty 300, 34d supply, fill #0

## 2023-11-11 MED ORDER — ONDANSETRON HCL 4 MG/2ML IJ SOLN
4.0000 mg | Freq: Once | INTRAMUSCULAR | Status: AC
  Administered 2023-11-11: 4 mg via INTRAVENOUS
  Filled 2023-11-11: qty 2

## 2023-11-11 MED ORDER — MORPHINE SULFATE (PF) 4 MG/ML IV SOLN
4.0000 mg | Freq: Once | INTRAVENOUS | Status: AC
  Administered 2023-11-11: 4 mg via INTRAVENOUS
  Filled 2023-11-11: qty 1

## 2023-11-11 MED ORDER — SODIUM CHLORIDE 0.9 % IV SOLN: INTRAVENOUS | Status: DC

## 2023-11-11 NOTE — ED Triage Notes (Signed)
 PT present with complaints of RUQ pain, dizziness, nausea and vomiting since Easter. # days ago noticed stool was white. Went to pcp today and was advised to come to ER for lab work and US  of gallbladder.

## 2023-11-11 NOTE — Progress Notes (Signed)
 Patient ID: SENIQUA GILLIARD, female    DOB: Nov 15, 1955  MRN: [id number]  CC: Abdominal Pain (Abdominal pain, white stool, nausea / vomiting X1 week - suspects that it may be gallbladder/Dizziness X1 week)   Subjective: Fathima Ord is a 68 y.o. female who presents for UC visit.  Daughter is with her. Her concerns today include:  Patient with history of DM (A1C 6.8 08/07/2023), GERD, hypothyroidism, HTN, HL, CAD with previous stent, COPD, chronic hypoxic resp failure on 2 L O2 continuous, substance abuse, depression/anxiety and tob dep, essential thrombocytosis, iron  deficiency anemia   Discussed the use of AI scribe software for clinical note transcription with the patient, who gave verbal consent to proceed.  History of Present Illness   VIHANA MANGANELLI is a 68 year old female who presents with right upper quadrant abdominal pain, nausea, and vomiting.  She has experienced right upper quadrant abdominal pain for over a week, which is constant with occasional stabbing sensations. Initially, she attributed the pain to healing rib fractures but became concerned with the development of additional symptoms.  Since Easter Sunday, she has had persistent nausea and vomiting, unable to tolerate solid foods but able to keep down fluids like diet soda. She has eaten very little in the past few days.  She noticed a change in her bowel movements, describing her stool as 'plum white' and loose on Sunday, with no bowel movements since. There is no blood in her stool.  She has experienced episodes of dizziness over the past week, particularly when walking or changing positions, lasting a few minutes and resolving spontaneously. Despite the nausea/vomiting, and dizziness, she has continued to take her BP meds and furosemide .   Patient Active Problem List   Diagnosis Date Noted   Acute on chronic hypoxic respiratory failure (HCC) 10/05/2023   Pneumothorax, right 09/16/2023   COPD with acute exacerbation  (HCC) 09/01/2023   Chronic hypoxic respiratory failure (HCC) 09/01/2023   Continuous dependence on cigarette smoking 09/01/2023   History of CAD (coronary artery disease) 09/01/2023   CAP (community acquired pneumonia) 09/01/2023   Infection due to parainfluenza virus 4 06/20/2023   Full code status 06/14/2023   Acute hypoxic respiratory failure (HCC) 06/13/2023   Acute exacerbation of chronic obstructive pulmonary disease (COPD) (HCC) 06/12/2023   Nicotine  dependence 05/03/2023   Substance abuse (HCC) 09/10/2022   Iron  deficiency anemia 11/02/2021   Major depressive disorder, recurrent, in full remission with anxious distress (HCC) 05/31/2021   Chronic neck pain 02/26/2021   GAD (generalized anxiety disorder) 01/08/2021   Esophageal dysphagia 04/28/2019   Bipolar I disorder (HCC) 09/17/2018   Insomnia 10/28/2017   Prediabetes 05/28/2017   QT prolongation 03/25/2017   Psoriasis 06/22/2015   COPD (chronic obstructive pulmonary disease) (HCC)GOLD E 05/18/2015   Anxiety and depression 07/10/2013   Essential hypertension 06/07/2013   Hyperlipidemia 06/07/2013   Asthma, chronic 06/07/2013   Emphysema lung (HCC) 06/07/2013   Chronic back pain 06/07/2013   CAD S/P percutaneous coronary angioplasty 06/07/2013   GERD (gastroesophageal reflux disease) 06/07/2013   Breast lump on left side at 3 o'clock position 10/13/2012   S/P abdominal hysterectomy and right salpingo-oophorectomy 08/29/2011   COPD exacerbation (HCC) 09/15/2009   TOBACCO ABUSE 07/17/2009   Chronic rhinitis 07/17/2009   Lung nodule < 6cm on CT 07/17/2009   Hypothyroidism 12/22/2008   Chronic depression 12/22/2008     Current Outpatient Medications on File Prior to Visit  Medication Sig Dispense Refill   acetaminophen  (TYLENOL )  500 MG tablet Take 2 tablets (1,000 mg total) by mouth every 6 (six) hours. (Patient taking differently: Take 1,000 mg by mouth every 6 (six) hours as needed for mild pain (pain score 1-3) or  moderate pain (pain score 4-6).)     albuterol  (VENTOLIN  HFA) 108 (90 Base) MCG/ACT inhaler INHALE 2 PUFFS into THE lungs EVERY 6 HOURS AS NEEDED wheezing AND SHORTNESS OF BREATH 54 g 1   alendronate  (FOSAMAX ) 70 MG tablet Take 1 tablet (70 mg total) by mouth every 7 (seven) days. Take with a full glass of water on an empty stomach. 12 tablet 1   amLODipine  (NORVASC ) 5 MG tablet Take 1 tablet (5 mg total) by mouth daily. (Patient taking differently: Take 5 mg by mouth in the morning.) 90 tablet 3   aspirin  EC 81 MG tablet Take 81 mg by mouth in the morning.     atorvastatin  (LIPITOR ) 80 MG tablet Take 1 tablet (80 mg total) by mouth daily. (Patient taking differently: Take 80 mg by mouth in the morning.) 90 tablet 3   Blood Glucose Monitoring Suppl (ACCU-CHEK GUIDE) w/Device KIT Use to check blood sugars once a day 1 kit 0   busPIRone  (BUSPAR ) 15 MG tablet Take 2 tablets (30 mg total) by mouth in the morning AND 1 tablet (15 mg total) daily at 12 noon AND 1 tablet (15 mg total) every evening. (Patient taking differently: Take 2 tablets (30 mg total) by mouth in the morning and 2 tablets (30 mg total) every night.) 120 tablet 1   clonazePAM  (KLONOPIN ) 0.5 MG tablet Take 1 tablet (0.5 mg total) by mouth 3 (three) times daily as needed for anxiety. (Patient taking differently: Take 0.5 mg by mouth in the morning and at bedtime.) 90 tablet 0   docusate sodium  (COLACE) 100 MG capsule Take 1 capsule (100 mg total) by mouth 2 (two) times daily as needed for mild constipation.     doxepin  (SINEQUAN ) 75 MG capsule Take 1 capsule (75 mg total) by mouth at bedtime. 30 capsule 1   escitalopram  (LEXAPRO ) 20 MG tablet Take 1 tablet (20 mg total) by mouth daily. (Patient taking differently: Take 20 mg by mouth at bedtime.) 30 tablet 1   ezetimibe  (ZETIA ) 10 MG tablet Take 1 tablet (10 mg total) by mouth daily. (Patient taking differently: Take 10 mg by mouth at bedtime.) 90 tablet 3   Fluticasone -Umeclidin-Vilant  (TRELEGY ELLIPTA ) 200-62.5-25 MCG/ACT AEPB Inhale 1 puff into the lungs daily. (Patient taking differently: Inhale 1 puff into the lungs in the morning.) 60 each 11   furosemide  (LASIX ) 20 MG tablet Take 1 tablet (20 mg total) by mouth daily. (Patient taking differently: Take 20 mg by mouth in the morning.) 90 tablet 3   gabapentin  (NEURONTIN ) 600 MG tablet TAKE 1 TABLET BY MOUTH 4 TIMES DAILY (Patient taking differently: Take 600 mg by mouth 2 (two) times daily.) 360 tablet 2   glucose blood (TRUE METRIX BLOOD GLUCOSE TEST) test strip Use as instructed to check blood sugars once a day 100 each 12   levothyroxine  (SYNTHROID ) 112 MCG tablet Take 1 tablet (112 mcg total) by mouth daily. (Patient taking differently: Take 112 mcg by mouth daily before breakfast.) 90 tablet 2   lidocaine  (LIDODERM ) 5 % Place 1 patch onto the skin daily. Remove & Discard patch within 12 hours or as directed by MD     losartan  (COZAAR ) 50 MG tablet Take 1.5 tablets (75 mg total) by mouth daily. (Patient taking  differently: Take 75 mg by mouth in the morning.) 135 tablet 3   metFORMIN  (GLUCOPHAGE ) 500 MG tablet Take 1 tablet (500 mg total) by mouth 2 (two) times daily with a meal. (Patient taking differently: Take 1 tablet (500 mg total) by mouth 2 (two) times daily with a meal.) 180 tablet 1   mirtazapine  (REMERON ) 45 MG tablet Take 1 tablet (45 mg total) by mouth at bedtime. 30 tablet 1   nebivolol  (BYSTOLIC ) 10 MG tablet Take 1 tablet (10 mg total) by mouth daily. (Patient taking differently: Take 10 mg by mouth at bedtime.) 90 tablet 3   nicotine  (NICODERM CQ  - DOSED IN MG/24 HOURS) 14 mg/24hr patch Place 1 patch (14 mg total) onto the skin daily. 28 patch 0   nitroGLYCERIN  (NITROSTAT ) 0.4 MG SL tablet Place 1 tablet (0.4 mg total) under the tongue every 5 (five) minutes as needed for chest pain. 25 tablet 3   omeprazole  (PRILOSEC) 40 MG capsule Take 1 capsule (40 mg total) by mouth 2 (two) times daily before a meal. 60  capsule 1   polyethylene glycol (MIRALAX  / GLYCOLAX ) 17 g packet Take 17 g by mouth daily as needed for severe constipation.     predniSONE  (DELTASONE ) 10 MG tablet Take 4 tablets (40mg ) daily for 2 days, then 3 tablets (30mg ) daily for 2 days, then 2 tablets (20mg ) daily for 2 days, then 1 tablet (10mg ) daily for 2 days, then stop 20 tablet 0   Spacer/Aero-Holding Chambers DEVI Use with the albuterol  inhaler. 1 each 0   ticagrelor  (BRILINTA ) 60 MG TABS tablet Take 1 tablet (60 mg total) by mouth 2 (two) times daily. 60 tablet 52   TRUEplus Lancets 28G MISC Use to check blood sugars once a day 100 each 2   No current facility-administered medications on file prior to visit.    Allergies  Allergen Reactions   Avocado Anaphylaxis   Latex Shortness Of Breath and Rash   Codeine  Nausea Only    Social History   Socioeconomic History   Marital status: Divorced    Spouse name: Not on file   Number of children: 1   Years of education: Not on file   Highest education level: 9th grade  Occupational History   Not on file  Tobacco Use   Smoking status: Every Day    Current packs/day: 0.50    Average packs/day: 0.5 packs/day for 50.0 years (25.0 ttl pk-yrs)    Types: Cigarettes   Smokeless tobacco: Never  Vaping Use   Vaping status: Some Days   Substances: Nicotine   Substance and Sexual Activity   Alcohol use: Yes    Comment: once a week   Drug use: Yes    Types: Marijuana    Comment: history of cocaine use, been about a year, per patient (12/18/21)   Sexual activity: Not Currently    Birth control/protection: Surgical  Other Topics Concern   Not on file  Social History Narrative   Not on file   Social Drivers of Health   Financial Resource Strain: Medium Risk (08/07/2023)   Overall Financial Resource Strain (CARDIA)    Difficulty of Paying Living Expenses: Somewhat hard  Food Insecurity: No Food Insecurity (10/05/2023)   Hunger Vital Sign    Worried About Running Out of Food in  the Last Year: Never true    Ran Out of Food in the Last Year: Never true  Recent Concern: Food Insecurity - Food Insecurity Present (08/07/2023)   Hunger Vital  Sign    Worried About Programme researcher, broadcasting/film/video in the Last Year: Sometimes true    Ran Out of Food in the Last Year: Sometimes true  Transportation Needs: No Transportation Needs (10/05/2023)   PRAPARE - Administrator, Civil Service (Medical): No    Lack of Transportation (Non-Medical): No  Physical Activity: Inactive (08/07/2023)   Exercise Vital Sign    Days of Exercise per Week: 0 days    Minutes of Exercise per Session: 0 min  Stress: No Stress Concern Present (08/07/2023)   [employer] of Occupational Health - Occupational Stress Questionnaire    Feeling of Stress : Only a little  Social Connections: Unknown (10/05/2023)   Social Connection and Isolation Panel [NHANES]    Frequency of Communication with Friends and Family: More than three times a week    Frequency of Social Gatherings with Friends and Family: Once a week    Attends Religious Services: Patient declined    Database administrator or Organizations: No    Attends Banker Meetings: Never    Marital Status: Divorced  Recent Concern: Social Connections - Moderately Isolated (09/16/2023)   Social Connection and Isolation Panel [NHANES]    Frequency of Communication with Friends and Family: More than three times a week    Frequency of Social Gatherings with Friends and Family: Twice a week    Attends Religious Services: 1 to 4 times per year    Active Member of [employer] or Organizations: No    Attends Banker Meetings: Never    Marital Status: Divorced  Catering manager Violence: Not At Risk (10/05/2023)   Humiliation, Afraid, Rape, and Kick questionnaire    Fear of Current or Ex-Partner: No    Emotionally Abused: No    Physically Abused: No    Sexually Abused: No    Family History  Problem Relation Age of Onset   Heart  disease Mother    Hypertension Mother    Stroke Mother    Mental illness Mother    Heart disease Father    Hypertension Father    Diabetes Father    Colon polyps Brother    Heart disease Maternal Grandfather    Cancer Paternal Grandmother        mouth   Anesthesia problems Daughter    Breast cancer Maternal Aunt    Throat cancer Maternal Uncle    Thyroid  disease Paternal Aunt    Hypotension Neg Hx    Malignant hyperthermia Neg Hx    Pseudochol deficiency Neg Hx    Colon cancer Neg Hx    Stomach cancer Neg Hx    Esophageal cancer Neg Hx    Pancreatic cancer Neg Hx    Rectal cancer Neg Hx     Past Surgical History:  Procedure Laterality Date   ABDOMINAL EXPLORATION SURGERY  1977   ABDOMINAL HYSTERECTOMY  1977   "left one of my ovaries"   BACK SURGERY     BIOPSY  05/27/2019   Procedure: BIOPSY;  Surgeon: Suzette Espy, MD;  Location: [facility];  Service: Endoscopy;;   BREAST BIOPSY Left 11/2021   x2   COLONOSCOPY  2011   Dr. Howard Macho: Mild diverticulosis, descending diminutive colon polyp (not retrieved), next colonoscopy 10 years   CORONARY ANGIOPLASTY WITH STENT PLACEMENT  2011 X2   "regular stents didn't work; had to go back in in ~ 1 month and put in medicated stents"   DILATION AND CURETTAGE  OF UTERUS     ESOPHAGOGASTRODUODENOSCOPY     ESOPHAGOGASTRODUODENOSCOPY (EGD) WITH PROPOFOL  N/A 05/27/2019   normal esophagus, dilation, erosive gastropathy s/p biopsy, normal duodenum. Negative H.pylori.    LEFT HEART CATH AND CORONARY ANGIOGRAPHY N/A 03/26/2017   Procedure: LEFT HEART CATH AND CORONARY ANGIOGRAPHY;  Surgeon: Arty Binning, MD;  Location: [facility];  Service: Cardiovascular;  Laterality: N/A;   LUMBAR LAMINECTOMY/DECOMPRESSION MICRODISCECTOMY  08/05/2011   Procedure: LUMBAR LAMINECTOMY/DECOMPRESSION MICRODISCECTOMY;  Surgeon: Shary Deems, MD;  Location: [facility];  Service: Neurosurgery;  Laterality: Right;  Right Lumbar four-five  extraforaminal discectomy   MALONEY DILATION N/A 05/27/2019   Procedure: Londa Rival DILATION;  Surgeon: Suzette Espy, MD;  Location: [facility];  Service: Endoscopy;  Laterality: N/A;  54   SHOULDER ARTHROSCOPY WITH ROTATOR CUFF REPAIR AND OPEN BICEPS TENODESIS Right 12/24/2021   Procedure: RIGHT SHOULDER ARTHROSCOPY WITH ROTATOR CUFF REPAIR AND BICEPS TENODESIS;  Surgeon: Adah Acron, MD;  Location: [facility];  Service: Orthopedics;  Laterality: Right;   TONSILLECTOMY     as a child   UPPER GASTROINTESTINAL ENDOSCOPY      ROS: Review of Systems Negative except as stated above  PHYSICAL EXAM: Ht 4\' 9"  (1.448 m)   Wt 154 lb (69.9 kg)   SpO2 97%   BMI 33.33 kg/m   Pt has her O2 with her and using it Sitting 97/63, P 72 Standing: BP 95/97, P 75 Physical Exam  General appearance - alert elderly female in NAD Mental status - normal mood, behavior, speech, dress, motor activity, and thought processes Chest - sounds severly decreased BL with scattered expiratory wheezes Heart - normal rate, regular rhythm, normal S1, S2, no murmurs, rubs, clicks or gallops Abdomen - nondistended, hyperactive bowel sounds, soft, mild RUQ tenderness Extremities - no LE edema      Latest Ref Rng & Units 10/08/2023    4:53 AM 10/06/2023    6:13 AM 10/05/2023    7:49 AM  CMP  Glucose 70 - 99 mg/dL 161  096  89   BUN 8 - 23 mg/dL 25  20  16    Creatinine 0.44 - 1.00 mg/dL 0.45  4.09  8.11   Sodium 135 - 145 mmol/L 139  141  140   Potassium 3.5 - 5.1 mmol/L 4.4  4.3  4.0   Chloride 98 - 111 mmol/L 104  105  103   CO2 22 - 32 mmol/L 28  27  28    Calcium  8.9 - 10.3 mg/dL 9.0  9.3  9.1   Total Protein 6.5 - 8.1 g/dL  5.9    Total Bilirubin 0.0 - 1.2 mg/dL  0.4    Alkaline Phos 38 - 126 U/L  120    AST 15 - 41 U/L  25    ALT 0 - 44 U/L  44     Lipid Panel     Component Value Date/Time   CHOL 192 06/26/2023 1339   TRIG 168 (H) 06/26/2023 1339   HDL 71 06/26/2023 1339   CHOLHDL 2.7 06/26/2023 1339    CHOLHDL 3.8 04/16/2016 0934   VLDL 15 04/16/2016 0934   LDLCALC 93 06/26/2023 1339    CBC    Component Value Date/Time   WBC 14.2 (H) 10/08/2023 0453   RBC 3.64 (L) 10/08/2023 0453   HGB 11.6 (L) 10/08/2023 0453   HGB 14.8 05/20/2023 1119   HCT 36.0 10/08/2023 0453   HCT 44.2 05/20/2023 1119  PLT 547 (H) 10/08/2023 0453   PLT 373 05/20/2023 1119   MCV 98.9 10/08/2023 0453   MCV 99 (H) 05/20/2023 1119   MCH 31.9 10/08/2023 0453   MCHC 32.2 10/08/2023 0453   RDW 15.7 (H) 10/08/2023 0453   RDW 13.2 05/20/2023 1119   LYMPHSABS 3.5 10/05/2023 0749   LYMPHSABS 2.6 04/05/2019 0926   MONOABS 1.4 (H) 10/05/2023 0749   EOSABS 0.2 10/05/2023 0749   EOSABS 0.3 04/05/2019 0926   BASOSABS 0.1 10/05/2023 0749   BASOSABS 0.1 04/05/2019 0926    ASSESSMENT AND PLAN: 1. Right lower quadrant abdominal pain (Primary) 2. Dehydration 3. Dizziness Patient presenting with right upper quadrant abdominal pain with nausea and vomiting for a little over a week.  She has not been able to keep any solid foods down.  She has started having dizziness.  She has continued to take her blood pressure medications including the furosemide .  Suspect gallbladder disease and dehydration.  Patient sent to the emergency room for further evaluation.    Patient was given the opportunity to ask questions.  Patient verbalized understanding of the plan and was able to repeat key elements of the plan.   This documentation was completed using Paediatric nurse.  Any transcriptional errors are unintentional.  No orders of the defined types were placed in this encounter.    Requested Prescriptions   Signed Prescriptions Disp Refills   ipratropium-albuterol  (DUONEB) 0.5-2.5 (3) MG/3ML SOLN 300 mL 4    Sig: Take 3 mLs by nebulization every 8 (eight) hours.   montelukast  (SINGULAIR ) 10 MG tablet 90 tablet 2    Sig: Take 1 tablet (10 mg total) by mouth at bedtime.    No follow-ups on  file.  Concetta Dee, MD, FACP

## 2023-11-11 NOTE — ED Notes (Addendum)
 [name] [name]

## 2023-11-12 ENCOUNTER — Encounter (HOSPITAL_COMMUNITY): Payer: Self-pay | Admitting: Internal Medicine

## 2023-11-12 ENCOUNTER — Other Ambulatory Visit (HOSPITAL_COMMUNITY): Payer: Self-pay

## 2023-11-12 ENCOUNTER — Other Ambulatory Visit: Payer: Self-pay

## 2023-11-12 ENCOUNTER — Emergency Department (HOSPITAL_COMMUNITY)

## 2023-11-12 DIAGNOSIS — J690 Pneumonitis due to inhalation of food and vomit: Secondary | ICD-10-CM | POA: Diagnosis not present

## 2023-11-12 DIAGNOSIS — K5732 Diverticulitis of large intestine without perforation or abscess without bleeding: Secondary | ICD-10-CM

## 2023-11-12 DIAGNOSIS — N179 Acute kidney failure, unspecified: Secondary | ICD-10-CM

## 2023-11-12 DIAGNOSIS — J189 Pneumonia, unspecified organism: Secondary | ICD-10-CM

## 2023-11-12 DIAGNOSIS — N189 Chronic kidney disease, unspecified: Secondary | ICD-10-CM

## 2023-11-12 DIAGNOSIS — A419 Sepsis, unspecified organism: Secondary | ICD-10-CM

## 2023-11-12 DIAGNOSIS — R1031 Right lower quadrant pain: Secondary | ICD-10-CM | POA: Diagnosis not present

## 2023-11-12 DIAGNOSIS — S2241XD Multiple fractures of ribs, right side, subsequent encounter for fracture with routine healing: Secondary | ICD-10-CM | POA: Diagnosis not present

## 2023-11-12 DIAGNOSIS — R112 Nausea with vomiting, unspecified: Secondary | ICD-10-CM | POA: Diagnosis not present

## 2023-11-12 DIAGNOSIS — N182 Chronic kidney disease, stage 2 (mild): Secondary | ICD-10-CM | POA: Insufficient documentation

## 2023-11-12 LAB — BASIC METABOLIC PANEL WITH GFR
Anion gap: 8 (ref 5–15)
BUN: 18 mg/dL (ref 8–23)
CO2: 26 mmol/L (ref 22–32)
Calcium: 8.1 mg/dL — ABNORMAL LOW (ref 8.9–10.3)
Chloride: 105 mmol/L (ref 98–111)
Creatinine, Ser: 1.09 mg/dL — ABNORMAL HIGH (ref 0.44–1.00)
GFR, Estimated: 56 mL/min — ABNORMAL LOW (ref >60)
Glucose, Bld: 77 mg/dL (ref 70–99)
Potassium: 3.5 mmol/L (ref 3.5–5.1)
Sodium: 139 mmol/L (ref 135–145)

## 2023-11-12 MED ORDER — AMOXICILLIN-POT CLAVULANATE 875-125 MG PO TABS: 1.0000 | ORAL_TABLET | Freq: Two times a day (BID) | ORAL | 0 refills | Status: DC

## 2023-11-12 MED ORDER — ONDANSETRON 4 MG PO TBDP: 4.0000 mg | ORAL_TABLET | Freq: Three times a day (TID) | ORAL | 0 refills | Status: DC | PRN

## 2023-11-12 MED ORDER — METRONIDAZOLE 500 MG/100ML IV SOLN: 500.0000 mg | Freq: Two times a day (BID) | INTRAVENOUS | Status: DC

## 2023-11-12 MED ORDER — TICAGRELOR 60 MG PO TABS
60.0000 mg | ORAL_TABLET | Freq: Two times a day (BID) | ORAL | 0 refills | Status: DC
  Filled 2023-11-12: qty 60, 30d supply, fill #0

## 2023-11-12 MED ORDER — CEFTRIAXONE SODIUM 2 G IJ SOLR
2.0000 g | INTRAMUSCULAR | Status: DC
  Administered 2023-11-12: 2 g via INTRAVENOUS
  Filled 2023-11-12: qty 20

## 2023-11-12 MED ORDER — METFORMIN HCL 500 MG PO TABS
500.0000 mg | ORAL_TABLET | Freq: Two times a day (BID) | ORAL | 1 refills | Status: DC
  Filled 2023-11-12: qty 180, 90d supply, fill #0
  Filled 2024-02-09: qty 180, 90d supply, fill #1

## 2023-11-12 MED ORDER — LOSARTAN POTASSIUM 50 MG PO TABS
75.0000 mg | ORAL_TABLET | Freq: Every day | ORAL | 0 refills | Status: DC
Start: 2023-11-12 — End: 2023-12-11
  Filled 2023-11-12: qty 135, 90d supply, fill #0

## 2023-11-12 MED ORDER — SODIUM CHLORIDE 0.9 % IV BOLUS
500.0000 mL | Freq: Once | INTRAVENOUS | Status: AC
  Administered 2023-11-12: 500 mL via INTRAVENOUS

## 2023-11-12 NOTE — ED Notes (Signed)
 Patient transported to CT

## 2023-11-12 NOTE — Discharge Instructions (Signed)
 You were seen in the ER today for evaluation of your symptoms.  I am glad that you are feeling better.  It was discovered that you had increase in your kidney function however this is improved with fluids.  Please make sure that you are remaining well-hydrated drinking plenty of fluids within your restrictions.  Your CT scan shows that you have diverticulitis.  For this, I am going to prescribe you an antibiotic to take.  Is important to follow-up with your primary care doctor and your GI specialist about these results.  I am also going to send you home with a few Zofran  medications to take as needed for nausea.  If you have any concerns, new or worsening symptoms, please return to your nearest emergency department for reevaluation.  Contact a doctor if: Your pain gets worse. You are not pooping like normal. Your symptoms do not get better. Your symptoms get worse very fast. You have a fever. You vomit more than one time. You have poop that is: Bloody. Black. Tarry.  Contact a health care provider if: Your symptoms get worse. You have new symptoms, such as: Headaches. Skin that is darker or lighter than normal. Easy bruising. Feeling itchy. Hiccups. Lack of menstrual periods. You have a fever. Get help right away if: You have signs of severe kidney disease, such as: Chest pain. Shortness of breath. Seizures. You have pain or bleeding when you pass urine. You make little or no urine. These symptoms may be an emergency. Get help right away. Call 911. Do not wait to see if the symptoms will go away. Do not drive yourself to the hospital.

## 2023-11-12 NOTE — ED Provider Notes (Signed)
 [place] EMERGENCY DEPARTMENT AT [facility] Provider Note   CSN: [id number] Arrival date & time: 11/11/23  1711     History Chief Complaint  Patient presents with   Dizziness   Abdominal Pain    [name] [name] is a 68 y.o. female with history of COPD, on 2 L nasal cannula chronically, hiatal hernia, CAD, CHF, hypertension presents emerged from today for evaluation of intermittent nausea vomiting or right upper quadrant pain.  Patient reports that it was present on 4-20 and 4-21.  Reports that she had a few episodes of nausea and vomiting with some right upper quadrant pain however this is subsided and was fine during the weekdays.  She reports that it resumed during the weekend.  Cannot remember what food she ate during this time.  She also reports that Monday she had a small bowel movement that was white in coloration.  Has not had any bowel movement since then.  Denies any melena or hematochezia.  She reports that she has been having more belching and flatulence lately.  Additionally, she had a fall previously that is already been worked up.  She has known broken ribs on the right side as well.  Not having any chest pain or shortness of breath different than her baseline.  She denies any fevers or chills.  Patient reports that she was seen by PCP and advised to come to the ER for lab work and ultrasound of gallbladder.  Patient's daughter also at bedside and contributes to history.  Even during the nausea and vomiting episodes, she has been taking her Lasix .  Patient reports that she does feel dehydrated.  The patient does still smoke tobacco however does take her oxygen  off during this time.  Denies any EtOH or drug use.  No medication taken for symptoms.  Nursing note mentions dizziness however patient denies dizziness and lightheadedness today.  Reports that she has been feeling more fatigued.   Dizziness Associated symptoms: nausea and vomiting   Associated symptoms: no blood  in stool, no chest pain, no diarrhea and no shortness of breath   Abdominal Pain Associated symptoms: nausea and vomiting   Associated symptoms: no chest pain, no chills, no constipation, no diarrhea, no dysuria, no fever, no hematuria, no shortness of breath, no vaginal bleeding and no vaginal discharge        Home Medications Prior to Admission medications   Medication Sig Start Date End Date Taking? Authorizing Provider  acetaminophen  (TYLENOL ) 500 MG tablet Take 2 tablets (1,000 mg total) by mouth every 6 (six) hours. Patient taking differently: Take 1,000 mg by mouth every 6 (six) hours as needed for mild pain (pain score 1-3) or moderate pain (pain score 4-6). 09/25/23   Elwin Hammond, PA-C  albuterol  (VENTOLIN  HFA) 108 (90 Base) MCG/ACT inhaler INHALE 2 PUFFS into THE lungs EVERY 6 HOURS AS NEEDED wheezing AND SHORTNESS OF BREATH 10/15/23   Lawrance Presume, MD  alendronate  (FOSAMAX ) 70 MG tablet Take 1 tablet (70 mg total) by mouth every 7 (seven) days. Take with a full glass of water on an empty stomach. 10/25/23   Lawrance Presume, MD  amLODipine  (NORVASC ) 5 MG tablet Take 1 tablet (5 mg total) by mouth daily. Patient taking differently: Take 5 mg by mouth in the morning. 09/22/23   Jude Norton, NP  aspirin  EC 81 MG tablet Take 81 mg by mouth in the morning.    [provider]  atorvastatin  (LIPITOR ) 80  MG tablet Take 1 tablet (80 mg total) by mouth daily. Patient taking differently: Take 80 mg by mouth in the morning. 09/22/23   Jude Norton, NP  Blood Glucose Monitoring Suppl (ACCU-CHEK GUIDE) w/Device KIT Use to check blood sugars once a day 09/05/23   Lawrance Presume, MD  busPIRone  (BUSPAR ) 15 MG tablet Take 2 tablets (30 mg total) by mouth in the morning AND 1 tablet (15 mg total) daily at 12 noon AND 1 tablet (15 mg total) every evening. Patient taking differently: Take 2 tablets (30 mg total) by mouth in the morning and 2 tablets (30 mg total) every night.  09/26/23   Nwoko, Uchenna E, PA  clonazePAM  (KLONOPIN ) 0.5 MG tablet Take 1 tablet (0.5 mg total) by mouth 3 (three) times daily as needed for anxiety. Patient taking differently: Take 0.5 mg by mouth in the morning and at bedtime. 09/26/23   Nwoko, Uchenna E, PA  docusate sodium  (COLACE) 100 MG capsule Take 1 capsule (100 mg total) by mouth 2 (two) times daily as needed for mild constipation. 09/25/23   Elwin Hammond, PA-C  doxepin  (SINEQUAN ) 75 MG capsule Take 1 capsule (75 mg total) by mouth at bedtime. 09/26/23   Nwoko, Uchenna E, PA  escitalopram  (LEXAPRO ) 20 MG tablet Take 1 tablet (20 mg total) by mouth daily. Patient taking differently: Take 20 mg by mouth at bedtime. 09/26/23   Nwoko, Uchenna E, PA  ezetimibe  (ZETIA ) 10 MG tablet Take 1 tablet (10 mg total) by mouth daily. Patient taking differently: Take 10 mg by mouth at bedtime. 07/04/23   Meng, Hao, PA  Fluticasone -Umeclidin-Vilant (TRELEGY ELLIPTA ) 200-62.5-25 MCG/ACT AEPB Inhale 1 puff into the lungs daily. Patient taking differently: Inhale 1 puff into the lungs in the morning. 08/07/23   Wilfredo Hanly, MD  furosemide  (LASIX ) 20 MG tablet Take 1 tablet (20 mg total) by mouth daily. Patient taking differently: Take 20 mg by mouth in the morning. 09/22/23   Jude Norton, NP  gabapentin  (NEURONTIN ) 600 MG tablet TAKE 1 TABLET BY MOUTH 4 TIMES DAILY Patient taking differently: Take 600 mg by mouth 2 (two) times daily. 12/20/22   Lawrance Presume, MD  glucose blood (TRUE METRIX BLOOD GLUCOSE TEST) test strip Use as instructed to check blood sugars once a day 09/05/23   Lawrance Presume, MD  ipratropium-albuterol  (DUONEB) 0.5-2.5 (3) MG/3ML SOLN Take 3 mLs by nebulization every 8 (eight) hours. 11/11/23   Lawrance Presume, MD  levothyroxine  (SYNTHROID ) 112 MCG tablet Take 1 tablet (112 mcg total) by mouth daily. Patient taking differently: Take 112 mcg by mouth daily before breakfast. 12/20/22   Lawrance Presume, MD  lidocaine   (LIDODERM ) 5 % Place 1 patch onto the skin daily. Remove & Discard patch within 12 hours or as directed by MD 09/25/23   Elwin Hammond, PA-C  losartan  (COZAAR ) 50 MG tablet Take 1.5 tablets (75 mg total) by mouth daily. Patient taking differently: Take 75 mg by mouth in the morning. 09/18/22   Jude Norton, NP  metFORMIN  (GLUCOPHAGE ) 500 MG tablet Take 1 tablet (500 mg total) by mouth 2 (two) times daily with a meal. Patient taking differently: Take 1 tablet (500 mg total) by mouth 2 (two) times daily with a meal. 04/23/23   Lawrance Presume, MD  mirtazapine  (REMERON ) 45 MG tablet Take 1 tablet (45 mg total) by mouth at bedtime. 09/26/23   Nwoko, Uchenna E, PA  montelukast  (SINGULAIR ) 10 MG tablet  Take 1 tablet (10 mg total) by mouth at bedtime. 11/11/23   Lawrance Presume, MD  nebivolol  (BYSTOLIC ) 10 MG tablet Take 1 tablet (10 mg total) by mouth daily. Patient taking differently: Take 10 mg by mouth at bedtime. 09/22/23   Jude Norton, NP  nicotine  (NICODERM CQ  - DOSED IN MG/24 HOURS) 14 mg/24hr patch Place 1 patch (14 mg total) onto the skin daily. 10/09/23   Leona Rake, MD  nitroGLYCERIN  (NITROSTAT ) 0.4 MG SL tablet Place 1 tablet (0.4 mg total) under the tongue every 5 (five) minutes as needed for chest pain. 09/18/22 08/31/24  Jude Norton, NP  omeprazole  (PRILOSEC) 40 MG capsule Take 1 capsule (40 mg total) by mouth 2 (two) times daily before a meal. 11/06/23   Danis, Cordelia Dessert, MD  polyethylene glycol (MIRALAX  / GLYCOLAX ) 17 g packet Take 17 g by mouth daily as needed for severe constipation. 09/25/23   Elwin Hammond, PA-C  predniSONE  (DELTASONE ) 10 MG tablet Take 4 tablets (40mg ) daily for 2 days, then 3 tablets (30mg ) daily for 2 days, then 2 tablets (20mg ) daily for 2 days, then 1 tablet (10mg ) daily for 2 days, then stop 10/09/23   Leona Rake, MD  Spacer/Aero-Holding Ismael Maria Use with the albuterol  inhaler. 07/22/23   Adria Hopkins, MD  ticagrelor  (BRILINTA ) 60 MG  TABS tablet Take 1 tablet (60 mg total) by mouth 2 (two) times daily. 09/18/22   Jude Norton, NP  TRUEplus Lancets 28G MISC Use to check blood sugars once a day 09/05/23   Lawrance Presume, MD      Allergies    Avocado, Latex, and Codeine     Review of Systems   Review of Systems  Constitutional:  Negative for chills and fever.  Respiratory:  Negative for shortness of breath.   Cardiovascular:  Negative for chest pain.  Gastrointestinal:  Positive for abdominal pain, nausea and vomiting. Negative for blood in stool, constipation and diarrhea.  Genitourinary:  Negative for dysuria, flank pain, frequency, hematuria, urgency, vaginal bleeding and vaginal discharge.  Neurological:  Negative for dizziness and light-headedness.    Physical Exam Updated Vital Signs BP (!) 118/56   Pulse 65   Temp 98.1 F (36.7 C) (Oral)   Resp 17   Wt 69 kg   SpO2 96%   BMI 32.92 kg/m  Physical Exam Constitutional:      Appearance: She is not toxic-appearing.     Comments: Chronically ill-appearing.  Does not appear to be in any acute distress.  Pleasant.  HENT:     Mouth/Throat:     Mouth: Mucous membranes are dry.  Eyes:     General: No scleral icterus. Cardiovascular:     Rate and Rhythm: Normal rate.     Pulses:          Dorsalis pedis pulses are 2+ on the right side and 2+ on the left side.       Posterior tibial pulses are 2+ on the right side and 2+ on the left side.  Pulmonary:     Effort: Pulmonary effort is normal.     Breath sounds: Normal breath sounds.     Comments: Small amount of expiratory wheezing auscultated.  Likely chronic for the patient no respiratory distress.  Speaking in full sentences.  On her at home oxygen . Chest:     Comments: The patient does have tenderness to the right upper quadrant however she does have tenderness over the right lateral ribs  as well that feels the same pain as palpation of the stomach per patient.  Are soft with abdomen guarding or rebound.   There is no overlying skin changes noted.  No increase in warmth or erythema.  No overlying rash. Abdominal:     General: There is no distension.     Palpations: Abdomen is soft.     Tenderness: There is abdominal tenderness in the right upper quadrant. There is no guarding or rebound.  Musculoskeletal:     Right lower leg: No edema.     Left lower leg: No edema.  Skin:    General: Skin is warm and dry.  Neurological:     Mental Status: She is alert.     ED Results / Procedures / Treatments   Labs (all labs ordered are listed, but only abnormal results are displayed) Labs Reviewed  COMPREHENSIVE METABOLIC PANEL WITH GFR - Abnormal; Notable for the following components:      Result Value   Creatinine, Ser 1.47 (*)    Total Protein 6.4 (*)    GFR, Estimated 39 (*)    All other components within normal limits  CBC - Abnormal; Notable for the following components:   WBC 12.4 (*)    Platelets 523 (*)    All other components within normal limits  URINALYSIS, ROUTINE W REFLEX MICROSCOPIC - Abnormal; Notable for the following components:   APPearance HAZY (*)    All other components within normal limits  LIPASE, BLOOD    EKG None  Radiology CT CHEST ABDOMEN PELVIS WO CONTRAST Result Date: 11/12/2023 EXAM: CT CHEST, ABDOMEN AND PELVIS WITHOUT CONTRAST 11/12/2023 01:57:59 AM TECHNIQUE: CT of the chest, abdomen and pelvis was performed without the administration of intravenous contrast. Multiplanar reformatted images are provided for review. Automated exposure control, iterative reconstruction, and/or weight based adjustment of the mA/kV was utilized to reduce the radiation dose to as low as reasonably achievable. COMPARISON: CTA chest dated 10/05/2023 and CT abdomen and pelvis dated 09/16/2023. CLINICAL HISTORY: Right sided abdominal pain and lower chest pain, history of aspiration pneumonia and right sided rib fracture. Patient presents with complaints of right upper quadrant pain,  dizziness, nausea and vomiting since Easter. Several days ago noticed stool was white. FINDINGS: CHEST: MEDIASTINUM: Heart and pericardium are unremarkable. The central airways are clear. Thoracic aortic atherosclerosis. LYMPH NODES: No evidence of mediastinal, hilar or axillary lymphadenopathy. LUNGS AND PLEURA: Mild bilateral lower lobe scarring versus atelectasis with bronchiectasis. Residual 7 mm nodule in the posterior lateral lobe, previously 12 mm, infectious or inflammatory. Given rapid improvement, no follow-up is recommended Mild centrilobular and paraseptal emphysematous changes, upper lung predominant. No evidence of pleural effusion or pneumothorax. ABDOMEN AND PELVIS: HEPATOBILIARY: The liver is unremarkable. Gallbladder is unremarkable. No biliary ductal dilatation. SPLEEN: No acute abnormality. PANCREAS: No acute abnormality. ADRENAL GLANDS: No acute abnormality. KIDNEYS, URETERS AND BLADDER: No stones in the kidneys or ureters. No evidence of hydronephrosis. No evidence of perinephric or periureteral stranding. Urinary bladder is unremarkable. GI AND BOWEL: Stomach demonstrates no acute abnormality. There is no evidence of bowel obstruction. No evidence of appendicitis. Sigmoid diverticulosis with very mild pericolonic inflammatory changes in the left lower pelvis, suggesting mild diverticulitis. No drainable fluid collection/abscess. Normal appendix. REPRODUCTIVE ORGANS: Status post hysterectomy. PERITONEUM AND RETROPERITONEUM: No evidence of ascites or free air. Atherosclerotic calcifications of the abdominal aorta and branch vessels. LYMPH NODES: No evidence of lymphadenopathy. BONES AND SOFT TISSUES: Multiple healing right posterior and lateral rib fractures. Mild degenerative  changes of the visualized thoracolumbar spine. No focal soft tissue abnormality. IMPRESSION: 1. Mild sigmoid diverticulitis. No drainable fluid collection/abscess. No free air. 2. Multiple healing right rib fractures. No  pneumothorax. 3. Additional ancillary findings, as above. Electronically signed by: Zadie Herter MD 11/12/2023 02:07 AM EDT RP Workstation: [id number]   US  Abdomen Limited RUQ (LIVER/GB) Result Date: 11/12/2023 CLINICAL DATA:  [id number] RUQ pain 151471 EXAM: ULTRASOUND ABDOMEN LIMITED RIGHT UPPER QUADRANT COMPARISON:  CT abdomen pelvis 09/16/2023 FINDINGS: Gallbladder: No gallstones or wall thickening visualized. No sonographic Murphy sign noted by sonographer. Common bile duct: Diameter: 2 mm Liver: No focal lesion identified. Within normal limits in parenchymal echogenicity. Portal vein is patent on color Doppler imaging with normal direction of blood flow towards the liver. Other: None. IMPRESSION: Unremarkable right upper quadrant ultrasound. Electronically Signed   By: Morgane  Naveau M.D.   On: 11/12/2023 00:46   DG Chest Portable 1 View Result Date: 11/11/2023 EXAM: 1 VIEW XRAY OF THE CHEST 11/11/2023 11:18:49 PM COMPARISON: 10/24/2023 CLINICAL HISTORY: Right rib pain. FINDINGS: LUNGS AND PLEURA: Mild bibasilar opacities, likely scarring/atelectasis. No consolidation. No pulmonary edema. No pleural effusion. No pneumothorax. HEART AND MEDIASTINUM: No acute abnormality of the cardiac and mediastinal silhouettes. Thoracic aortic atherosclerosis. BONES AND SOFT TISSUES: No acute osseous abnormality. IMPRESSION: 1. No acute findings. 2. Mild bibasilar opacities, likely scarring/atelectasis. Electronically signed by: Zadie Herter MD 11/11/2023 11:24 PM EDT RP Workstation: [id number]     Procedures Procedures   Medications Ordered in ED Medications  0.9 %  sodium chloride  infusion ( Intravenous New Bag/Given 11/11/23 2327)  sodium chloride  0.9 % bolus 500 mL (0 mLs Intravenous Stopped 11/12/23 0052)  morphine  (PF) 4 MG/ML injection 4 mg (4 mg Intravenous Given 11/11/23 2324)  ondansetron  (ZOFRAN ) injection 4 mg (4 mg Intravenous Given 11/11/23 2337)    ED Course/ Medical Decision Making/  A&P Clinical Course as of 11/14/23 2033  Tue Nov 11, 2023  2329 QTC was 446, will proceed with Zofran  [RR]  Wed Nov 12, 2023  0659 Patient is awake and drinking coffee now. Reports that she is pain free. She is not having any nausea. Will send her home with Augmentin  for diverticulitis and recommend follow up with GI specialist and PCP.  [RR]    Clinical Course User Index [RR] Spence Dux, PA-C   Medical Decision Making Amount and/or Complexity of Data Reviewed Labs: ordered. Radiology: ordered.  Risk Prescription drug management. Decision regarding hospitalization.   68 y.o. female presents to the ER for evaluation of nausea, vomiting, right upper quadrant pain. Differential diagnosis includes but is not limited to PUD, gastritis, pancreatitis, gastroparesis, malignancy, biliary disease, ACS, pericarditis, pneumonia, intestinal ischemia, esophageal rupture, hepatitis. Vital signs blood pressure 107 56 otherwise unremarkable. Physical exam as noted above.   Patient has known right sided rib fractures.  She has tenderness over the chest wall as well as right upper quadrant.  Unsure if this is pain from ribs or gallbladder in etiology.  She was also recently treated for pneumonia.  Could be having some pain from that as well.  Will proceed with lab work and imaging.  I independently reviewed and interpreted the patient's labs.  Urinalysis shows hazy urine otherwise unremarkable.  She is not having any urinary symptoms.  Lipase within normal limits.  CBC does show slight leukocytosis at 12.4.  Platelets at 523 however this appears chronic for the patient.  No anemia.  CMP shows creatinine at 1.47 with patient's baseline being around 0.7.  Total protein decreased to 6.4.  Otherwise no electrolyte or LFT abnormality.  Lipase within normal limits.  Patient's lab work consistent with AKI.  Likely in the setting of receiving Lasix  with decreased p.o. intake and nausea vomiting.  EF from  echocardiogram 12/24 shows EF of 60 to 65%.  Patient was already ordered 500 mL of normal saline bolus given her dry appearance initially.  Will order normal saline infusion.  X-ray shows 1. No acute findings. 2. Mild bibasilar opacities, likely scarring/atelectasis.   Upper quadrant ultrasound shows Unremarkable right upper quadrant ultrasound..  I consulted hospitalist given patient's elevated creatinine and AKI standards for admission however hospitalist, Dr. Bentley Bray, does not see this is necessary. He feels that the patient can receive fluids in the ER and be discharged as long as she is feeling better. Reports to give another half liter of fluid, ordered a CT Noncon of the chest abdomen pelvis (given AKI) to see if there is any other etiology for this pain and recommends repeating a BMP.  If creatinine has improved and she is tolerating p.o., can be discharged home, regarding nonemergent CT scan findings.    CT scan shows  1. Mild sigmoid diverticulitis. No drainable fluid collection/abscess. No free air. 2. Multiple healing right rib fractures. No pneumothorax. 3. Additional ancillary findings, as above. Per radiologist's interpretation.    Repeat BMP shows Cr from 1.47 to 1.09.   She is drinking coffee and eating in no acute distress. She reports that she no longer has any abdominal pain. Abdominal exam soft and non tender. Given her improving Cr and resolution of pain, she is stable for discharge. She is well established with GI and recommended follow up with them for re-evaluation.  Ultrasound does not show any acute cholecystitis or cholelithiasis.  She may be having some gallbladder dysfunction however has reassuring lipase and LFTs.  Can follow-up outpatient.  Given the diverticulitis, will discharge home on Augmentin  and give ODT zofran  for nausea.   I discussed workup and plan with patient and daughter. We discussed the results of the labs/imaging. The plan is PO hydration, take  medications, follow up. We discussed strict return precautions and red flag symptoms. The patient and daughter verbalized their understanding and agrees to the plan. The patient is stable and being discharged home in good condition.  Portions of this report may have been transcribed using voice recognition software. Every effort was made to ensure accuracy; however, inadvertent computerized transcription errors may be present.    Final Clinical Impression(s) / ED Diagnoses Final diagnoses:  AKI (acute kidney injury) (HCC)  Right flank pain  Sigmoid diverticulitis    Rx / DC Orders ED Discharge Orders          Ordered    amoxicillin -clavulanate (AUGMENTIN ) 875-125 MG tablet  Every 12 hours        11/12/23 0729    ondansetron  (ZOFRAN -ODT) 4 MG disintegrating tablet  Every 8 hours PRN        11/12/23 0729              Spence Dux, PA-C 11/14/23 2049    Sallyanne Creamer, DO 11/17/23 1149

## 2023-11-13 ENCOUNTER — Other Ambulatory Visit: Payer: Self-pay

## 2023-11-13 ENCOUNTER — Other Ambulatory Visit (HOSPITAL_COMMUNITY): Payer: Self-pay

## 2023-11-14 ENCOUNTER — Telehealth (INDEPENDENT_AMBULATORY_CARE_PROVIDER_SITE_OTHER): Admitting: Physician Assistant

## 2023-11-14 ENCOUNTER — Telehealth (HOSPITAL_COMMUNITY): Admitting: Physician Assistant

## 2023-11-14 ENCOUNTER — Encounter (HOSPITAL_COMMUNITY): Payer: Self-pay

## 2023-11-14 ENCOUNTER — Encounter: Payer: Self-pay | Admitting: Hematology

## 2023-11-14 DIAGNOSIS — Z79899 Other long term (current) drug therapy: Secondary | ICD-10-CM

## 2023-11-14 DIAGNOSIS — F411 Generalized anxiety disorder: Secondary | ICD-10-CM | POA: Diagnosis not present

## 2023-11-14 DIAGNOSIS — F3342 Major depressive disorder, recurrent, in full remission: Secondary | ICD-10-CM | POA: Diagnosis not present

## 2023-11-17 ENCOUNTER — Encounter (HOSPITAL_COMMUNITY): Payer: Self-pay | Admitting: Physician Assistant

## 2023-11-17 DIAGNOSIS — Z79899 Other long term (current) drug therapy: Secondary | ICD-10-CM | POA: Insufficient documentation

## 2023-11-17 MED ORDER — ESCITALOPRAM OXALATE 20 MG PO TABS
20.0000 mg | ORAL_TABLET | Freq: Every day | ORAL | 0 refills | Status: DC
  Filled 2023-11-17 – 2023-12-01 (×2): qty 90, 90d supply, fill #0

## 2023-11-17 MED ORDER — DOXEPIN HCL 75 MG PO CAPS
75.0000 mg | ORAL_CAPSULE | Freq: Every day | ORAL | 0 refills | Status: DC
  Filled 2023-11-17 – 2023-12-01 (×2): qty 90, 90d supply, fill #0

## 2023-11-17 MED ORDER — BUSPIRONE HCL 15 MG PO TABS
ORAL_TABLET | ORAL | 0 refills | Status: DC
Start: 2023-11-17 — End: 2024-02-06
  Filled 2023-11-17 – 2023-12-01 (×2): qty 360, 90d supply, fill #0

## 2023-11-17 MED ORDER — MIRTAZAPINE 45 MG PO TABS
45.0000 mg | ORAL_TABLET | Freq: Every day | ORAL | 0 refills | Status: DC
  Filled 2023-11-17 – 2023-12-01 (×2): qty 90, 90d supply, fill #0

## 2023-11-17 NOTE — Progress Notes (Signed)
 [facility] MD/PA/NP OP Progress Note  Virtual Visit via Video Note  I connected with [name] [name] on 11/14/23 at  3:30 PM EDT by a video enabled telemedicine application and verified that I am speaking with the correct person using two identifiers.  Location: Patient: Home Provider: Clinic   I discussed the limitations of evaluation and management by telemedicine and the availability of in person appointments. The patient expressed understanding and agreed to proceed.  Follow Up Instructions:   I discussed the assessment and treatment plan with the patient. The patient was provided an opportunity to ask questions and all were answered. The patient agreed with the plan and demonstrated an understanding of the instructions.   The patient was advised to call back or seek an in-person evaluation if the symptoms worsen or if the condition fails to improve as anticipated.  I provided 18 minutes of non-face-to-face time during this encounter.  [name], PA   11/14/2023 4:00 PM [name] [name]  MRN:  [id number]  Chief Complaint:  Chief Complaint  Patient presents with   Follow-up   Medication Refill   HPI:   [name] [name]. [name] is a 68 year old female with a past psychiatric history significant for insomnia, generalized anxiety disorder, and major depressive disorder who presents to [facility] via virtual video visit for follow-up and medication management.  Patient is currently being managed on the following medications:  Doxepin  (Sinequan ) 75 mg at bedtime Buspirone  15 mg 3 times daily in the morning/1 tablet at noon/1 tablet in the evening  Escitalopram  30 mg daily Clonazepam  0.5 mg 2 times daily as needed Mirtazapine  45 mg at bedtime  Patient presents to the encounter stating that she recently went to the hospital due to an inflamed gallbladder.  Despite going to the hospital, patient reports that she has been taking her medications regularly and  denies experiencing any adverse side effects.  Patient reports that she still continues to use her clonazepam  when needed.  She reports when taking the medication, it goes into effect within 30 minutes.  She reports that the clonazepam  last most of the day when using.  Patient denies overt depressive symptoms at this time.  Patient endorses some anxiety and rates her anxiety at a 5 out of 10.  Patient denies any new stressors at this time.  A GAD-7 screen was performed and patient is going to 5.  Patient is alert and oriented x 4, pleasant, calm, cooperative, and fully engaged in conversation during the encounter.  Patient endorses good mood.  Patient exhibits euthymic mood with appropriate affect.  Patient denies suicidal or homicidal ideations.  She further denies auditory or visual hallucinations and does not appear to be responding to internal/external stimuli.  Patient endorses good sleep and receives on average 8 to 10 hours of sleep per night.  Patient endorses fair appetite and eats on average 2 meals per day.  Patient denies alcohol consumption or illicit drug use.  Patient endorses tobacco use and smokes on average a pack per day.  Visit Diagnosis:    ICD-10-CM   1. GAD (generalized anxiety disorder)  F41.1 escitalopram  (LEXAPRO ) 20 MG tablet    busPIRone  (BUSPAR ) 15 MG tablet    mirtazapine  (REMERON ) 45 MG tablet    2. Major depressive disorder, recurrent, in full remission with anxious distress (HCC)  F33.42 escitalopram  (LEXAPRO ) 20 MG tablet    busPIRone  (BUSPAR ) 15 MG tablet    mirtazapine  (REMERON ) 45 MG tablet  doxepin  (SINEQUAN ) 75 MG capsule      Past Psychiatric History:  Insomnia Major depressive disorder Generalized anxiety disorder  Past Medical History:  Past Medical History:  Diagnosis Date   Allergy    seasonal   Anemia    Anxiety    takes Lexapro  daily   Arthritis    "back, from neck down pass my bra area" (03/25/2017)   Asthma    Bartholin gland cyst  08/29/2011   Bruises easily    pt is on Effient    Chronic back pain    herniated nucleus pulposus   Chronic back pain    "neck to bra area; lower back" (03/25/2017)   Chronic kidney disease    recurrent UTI's this year 2022   COPD (chronic obstructive pulmonary disease) (HCC)    early stages   Coronary artery disease    Depression    takes Klonopin  daily   Diabetes mellitus without complication (HCC)    Diverticulosis    Fibroadenoma of left breast    GERD (gastroesophageal reflux disease)    takes Nexium  daily   H/O hiatal hernia    Heart attack (HCC) 2011   Hemorrhoids    Hernia    Hyperlipidemia    takes Lipitor  daily   Hypertension    takes Losartan  daily and Labetalol  bid   Hypothyroidism    takes Synthroid  daily   Insomnia    hydroxyzine  prn   Joint pain    Pneumonia    "couple times" (03/25/2017)   Pre-diabetes    "just found out 1 wk ago" (03/25/2017)   Psoriasis    elbows,knees,back   Shortness of breath    with exertion   Slowing of urinary stream    Stress incontinence     Past Surgical History:  Procedure Laterality Date   ABDOMINAL EXPLORATION SURGERY  1977   ABDOMINAL HYSTERECTOMY  1977   "left one of my ovaries"   BACK SURGERY     BIOPSY  05/27/2019   Procedure: BIOPSY;  Surgeon: Suzette Espy, MD;  Location: [facility];  Service: Endoscopy;;   BREAST BIOPSY Left 11/2021   x2   COLONOSCOPY  2011   Dr. Howard Macho: Mild diverticulosis, descending diminutive colon polyp (not retrieved), next colonoscopy 10 years   CORONARY ANGIOPLASTY WITH STENT PLACEMENT  2011 X2   "regular stents didn't work; had to go back in in ~ 1 month and put in medicated stents"   DILATION AND CURETTAGE OF UTERUS     ESOPHAGOGASTRODUODENOSCOPY     ESOPHAGOGASTRODUODENOSCOPY (EGD) WITH PROPOFOL  N/A 05/27/2019   normal esophagus, dilation, erosive gastropathy s/p biopsy, normal duodenum. Negative H.pylori.    LEFT HEART CATH AND CORONARY ANGIOGRAPHY N/A 03/26/2017    Procedure: LEFT HEART CATH AND CORONARY ANGIOGRAPHY;  Surgeon: Arty Binning, MD;  Location: [facility] INVASIVE CV LAB;  Service: Cardiovascular;  Laterality: N/A;   LUMBAR LAMINECTOMY/DECOMPRESSION MICRODISCECTOMY  08/05/2011   Procedure: LUMBAR LAMINECTOMY/DECOMPRESSION MICRODISCECTOMY;  Surgeon: Shary Deems, MD;  Location: [facility] NEURO ORS;  Service: Neurosurgery;  Laterality: Right;  Right Lumbar four-five extraforaminal discectomy   MALONEY DILATION N/A 05/27/2019   Procedure: Londa Rival DILATION;  Surgeon: Suzette Espy, MD;  Location: [facility];  Service: Endoscopy;  Laterality: N/A;  54   SHOULDER ARTHROSCOPY WITH ROTATOR CUFF REPAIR AND OPEN BICEPS TENODESIS Right 12/24/2021   Procedure: RIGHT SHOULDER ARTHROSCOPY WITH ROTATOR CUFF REPAIR AND BICEPS TENODESIS;  Surgeon: Adah Acron, MD;  Location: [facility] OR;  Service: Orthopedics;  Laterality: Right;   TONSILLECTOMY     as a child   UPPER GASTROINTESTINAL ENDOSCOPY      Family Psychiatric History:  See intake H&P for full details. Reviewed, with no updates at this time.  Family History:  Family History  Problem Relation Age of Onset   Heart disease Mother    Hypertension Mother    Stroke Mother    Mental illness Mother    Heart disease Father    Hypertension Father    Diabetes Father    Colon polyps Brother    Heart disease Maternal Grandfather    Cancer Paternal Grandmother        mouth   Anesthesia problems Daughter    Breast cancer Maternal Aunt    Throat cancer Maternal Uncle    Thyroid  disease Paternal Aunt    Hypotension Neg Hx    Malignant hyperthermia Neg Hx    Pseudochol deficiency Neg Hx    Colon cancer Neg Hx    Stomach cancer Neg Hx    Esophageal cancer Neg Hx    Pancreatic cancer Neg Hx    Rectal cancer Neg Hx     Social History:  Social History   Socioeconomic History   Marital status: Divorced    Spouse name: Not on file   Number of children: 1   Years of education: Not on file   Highest education  level: 9th grade  Occupational History   Not on file  Tobacco Use   Smoking status: Every Day    Current packs/day: 0.50    Average packs/day: 0.5 packs/day for 50.0 years (25.0 ttl pk-yrs)    Types: Cigarettes   Smokeless tobacco: Never  Vaping Use   Vaping status: Some Days   Substances: Nicotine   Substance and Sexual Activity   Alcohol use: Yes    Comment: once a week   Drug use: Yes    Types: Marijuana    Comment: history of cocaine use, been about a year, per patient (12/18/21)   Sexual activity: Not Currently    Birth control/protection: Surgical  Other Topics Concern   Not on file  Social History Narrative   Not on file   Social Drivers of Health   Financial Resource Strain: Medium Risk (08/07/2023)   Overall Financial Resource Strain (CARDIA)    Difficulty of Paying Living Expenses: Somewhat hard  Food Insecurity: No Food Insecurity (10/05/2023)   Hunger Vital Sign    Worried About Running Out of Food in the Last Year: Never true    Ran Out of Food in the Last Year: Never true  Recent Concern: Food Insecurity - Food Insecurity Present (08/07/2023)   Hunger Vital Sign    Worried About Running Out of Food in the Last Year: Sometimes true    Ran Out of Food in the Last Year: Sometimes true  Transportation Needs: No Transportation Needs (10/05/2023)   PRAPARE - Administrator, Civil Service (Medical): No    Lack of Transportation (Non-Medical): No  Physical Activity: Inactive (08/07/2023)   Exercise Vital Sign    Days of Exercise per Week: 0 days    Minutes of Exercise per Session: 0 min  Stress: No Stress Concern Present (08/07/2023)   [employer] of Occupational Health - Occupational Stress Questionnaire    Feeling of Stress : Only a little  Social Connections: Unknown (10/05/2023)   Social Connection and Isolation Panel [NHANES]    Frequency of Communication with Friends and Family: More than  three times a week    Frequency of Social Gatherings with  Friends and Family: Once a week    Attends Religious Services: Patient declined    Database administrator or Organizations: No    Attends Engineer, structural: Never    Marital Status: Divorced  Recent Concern: Social Connections - Moderately Isolated (09/16/2023)   Social Connection and Isolation Panel [NHANES]    Frequency of Communication with Friends and Family: More than three times a week    Frequency of Social Gatherings with Friends and Family: Twice a week    Attends Religious Services: 1 to 4 times per year    Active Member of [employer] or Organizations: No    Attends Banker Meetings: Never    Marital Status: Divorced    Allergies:  Allergies  Allergen Reactions   Avocado Anaphylaxis   Latex Shortness Of Breath and Rash   Codeine  Nausea Only    Metabolic Disorder Labs: Lab Results  Component Value Date   HGBA1C 6.2 (H) 09/01/2023   MPG 131.24 09/01/2023   MPG 105.41 12/18/2021   No results found for: "PROLACTIN" Lab Results  Component Value Date   CHOL 192 06/26/2023   TRIG 168 (H) 06/26/2023   HDL 71 06/26/2023   CHOLHDL 2.7 06/26/2023   VLDL 15 04/16/2016   LDLCALC 93 06/26/2023   LDLCALC 136 (H) 03/21/2022   Lab Results  Component Value Date   TSH 11.100 (H) 10/24/2023   TSH 1.790 05/20/2023    Therapeutic Level Labs: No results found for: "LITHIUM" No results found for: "VALPROATE" No results found for: "CBMZ"  Current Medications: Current Outpatient Medications  Medication Sig Dispense Refill   acetaminophen  (TYLENOL ) 500 MG tablet Take 2 tablets (1,000 mg total) by mouth every 6 (six) hours. (Patient taking differently: Take 1,000 mg by mouth in the morning and at bedtime.)     albuterol  (VENTOLIN  HFA) 108 (90 Base) MCG/ACT inhaler INHALE 2 PUFFS into THE lungs EVERY 6 HOURS AS NEEDED wheezing AND SHORTNESS OF BREATH 54 g 1   alendronate  (FOSAMAX ) 70 MG tablet Take 1 tablet (70 mg total) by mouth every 7 (seven) days. Take with  a full glass of water on an empty stomach. 12 tablet 1   amLODipine  (NORVASC ) 5 MG tablet Take 1 tablet (5 mg total) by mouth daily. (Patient taking differently: Take 5 mg by mouth in the morning.) 90 tablet 3   amoxicillin -clavulanate (AUGMENTIN ) 875-125 MG tablet Take 1 tablet by mouth every 12 (twelve) hours. 14 tablet 0   aspirin  EC 81 MG tablet Take 81 mg by mouth in the morning.     atorvastatin  (LIPITOR ) 80 MG tablet Take 1 tablet (80 mg total) by mouth daily. (Patient taking differently: Take 80 mg by mouth in the morning.) 90 tablet 3   Blood Glucose Monitoring Suppl (ACCU-CHEK GUIDE) w/Device KIT Use to check blood sugars once a day 1 kit 0   busPIRone  (BUSPAR ) 15 MG tablet Take 2 tablets (30 mg total) by mouth in the morning AND 1 tablet (15 mg total) daily at 12 noon AND 1 tablet (15 mg total) every evening. 360 tablet 0   clonazePAM  (KLONOPIN ) 0.5 MG tablet Take 1 tablet (0.5 mg total) by mouth 3 (three) times daily as needed for anxiety. (Patient taking differently: Take 0.5 mg by mouth in the morning and at bedtime.) 90 tablet 0   docusate sodium  (COLACE) 100 MG capsule Take 1 capsule (100 mg total) by  mouth 2 (two) times daily as needed for mild constipation.     doxepin  (SINEQUAN ) 75 MG capsule Take 1 capsule (75 mg total) by mouth at bedtime. 90 capsule 0   escitalopram  (LEXAPRO ) 20 MG tablet Take 1 tablet (20 mg total) by mouth daily. 90 tablet 0   ezetimibe  (ZETIA ) 10 MG tablet Take 1 tablet (10 mg total) by mouth daily. (Patient taking differently: Take 10 mg by mouth at bedtime.) 90 tablet 3   Fluticasone -Umeclidin-Vilant (TRELEGY ELLIPTA ) 200-62.5-25 MCG/ACT AEPB Inhale 1 puff into the lungs daily. (Patient taking differently: Inhale 1 puff into the lungs in the morning.) 60 each 11   furosemide  (LASIX ) 20 MG tablet Take 1 tablet (20 mg total) by mouth daily. (Patient taking differently: Take 20 mg by mouth in the morning.) 90 tablet 3   gabapentin  (NEURONTIN ) 600 MG tablet TAKE  1 TABLET BY MOUTH 4 TIMES DAILY (Patient taking differently: Take 600 mg by mouth 2 (two) times daily.) 360 tablet 2   glucose blood (TRUE METRIX BLOOD GLUCOSE TEST) test strip Use as instructed to check blood sugars once a day 100 each 12   ipratropium-albuterol  (DUONEB) 0.5-2.5 (3) MG/3ML SOLN Take 3 mLs by nebulization every 8 (eight) hours. 300 mL 4   levothyroxine  (SYNTHROID ) 112 MCG tablet Take 1 tablet (112 mcg total) by mouth daily. (Patient taking differently: Take 112 mcg by mouth daily before breakfast.) 90 tablet 2   lidocaine  (LIDODERM ) 5 % Place 1 patch onto the skin daily. Remove & Discard patch within 12 hours or as directed by MD (Patient not taking: Reported on 11/12/2023)     losartan  (COZAAR ) 50 MG tablet Take 1.5 tablets (75 mg total) by mouth daily. 135 tablet 0   metFORMIN  (GLUCOPHAGE ) 500 MG tablet Take 1 tablet (500 mg total) by mouth 2 (two) times daily with a meal. 180 tablet 1   mirtazapine  (REMERON ) 45 MG tablet Take 1 tablet (45 mg total) by mouth at bedtime. 90 tablet 0   montelukast  (SINGULAIR ) 10 MG tablet Take 1 tablet (10 mg total) by mouth at bedtime. 90 tablet 2   nebivolol  (BYSTOLIC ) 10 MG tablet Take 1 tablet (10 mg total) by mouth daily. (Patient taking differently: Take 10 mg by mouth at bedtime.) 90 tablet 3   nicotine  (NICODERM CQ  - DOSED IN MG/24 HOURS) 14 mg/24hr patch Place 1 patch (14 mg total) onto the skin daily. 28 patch 0   nitroGLYCERIN  (NITROSTAT ) 0.4 MG SL tablet Place 1 tablet (0.4 mg total) under the tongue every 5 (five) minutes as needed for chest pain. 25 tablet 3   omeprazole  (PRILOSEC) 40 MG capsule Take 1 capsule (40 mg total) by mouth 2 (two) times daily before a meal. 60 capsule 1   ondansetron  (ZOFRAN -ODT) 4 MG disintegrating tablet Take 1 tablet (4 mg total) by mouth every 8 (eight) hours as needed for nausea or vomiting. 20 tablet 0   polyethylene glycol (MIRALAX  / GLYCOLAX ) 17 g packet Take 17 g by mouth daily as needed for severe  constipation.     predniSONE  (DELTASONE ) 10 MG tablet Take 4 tablets (40mg ) daily for 2 days, then 3 tablets (30mg ) daily for 2 days, then 2 tablets (20mg ) daily for 2 days, then 1 tablet (10mg ) daily for 2 days, then stop (Patient not taking: Reported on 11/12/2023) 20 tablet 0   Spacer/Aero-Holding [facility] Use with the albuterol  inhaler. 1 each 0   ticagrelor  (BRILINTA ) 60 MG TABS tablet Take 1 tablet (60 mg  total) by mouth 2 (two) times daily. 60 tablet 0   TRUEplus Lancets 28G MISC Use to check blood sugars once a day 100 each 2   No current facility-administered medications for this visit.     Musculoskeletal: Strength & Muscle Tone: Unable to assess due to telemedicine visit Gait & Station: Unable to assess due to telemedicine visit Patient leans: Unable to assess due to telemedicine visit  Psychiatric Specialty Exam: Review of Systems  Psychiatric/Behavioral:  Negative for dysphoric mood, hallucinations, self-injury, sleep disturbance and suicidal ideas. The patient is nervous/anxious. The patient is not hyperactive.     There were no vitals taken for this visit.There is no height or weight on file to calculate BMI.  General Appearance: Casual  Eye Contact:  Good  Speech:  Clear and Coherent and Normal Rate  Volume:  Normal  Mood:  Anxious  Affect:  Appropriate  Thought Process:  Coherent, Goal Directed, and Descriptions of Associations: Intact  Orientation:  Full (Time, Place, and Person)  Thought Content: WDL   Suicidal Thoughts:  No  Homicidal Thoughts:  No  Memory:  Immediate;   Good Recent;   Good Remote;   Good  Judgement:  Fair  Insight:  Fair  Psychomotor Activity:  Normal  Concentration:  Concentration: Good and Attention Span: Good  Recall:  [employer] of Knowledge: Fair  Language: Fair  Akathisia:  Negative  Handed:  Right  AIMS (if indicated): not done  Assets:  Communication Skills Desire for Improvement Housing  ADL's:  Intact  Cognition: WNL   Sleep:  Good   Screenings: AUDIT    Flowsheet Row [facility]  Alcohol Use Disorder Identification Test Final Score (AUDIT) 12      CAGE-AID    Flowsheet Row ED to Hosp-Admission (Discharged) from 09/16/2023 in [facility] 4E CV SURGICAL PROGRESSIVE CARE  CAGE-AID Score 0      GAD-7    Flowsheet Row Video Visit from 11/14/2023 in [facility] Office Visit from 11/11/2023 in [facility] [place] - A Dept Of [place]. [facility] Office Visit from 10/09/2023 in [facility] - A Dept Of [place] Fragmin. [facility] Video Visit from 09/26/2023 in [facility] Office Visit from 08/07/2023 in [facility] [place] - A Dept Of [place]. [facility]  Total GAD-7 Score 5 5 14 14 7       PHQ2-9    Flowsheet Row Video Visit from 11/14/2023 in [facility] Office Visit from 11/11/2023 in [facility] [place] - A Dept Of [place]. [facility] Office Visit from 10/09/2023 in [facility] - A Dept Of [place] Fragmin. [facility] Video Visit from 09/26/2023 in [facility] Office Visit from 08/07/2023 in [facility] [place] - A Dept Of [place]. [facility]  PHQ-2 Total Score 0 2 5 0 3  PHQ-9 Total Score -- 8 14 -- 7      Flowsheet Row Video Visit from 11/14/2023 in [facility] Dt ED to Hosp-Admission (Discharged) from 10/05/2023 in [facility] GENERAL MED/SURG UNIT Video Visit from 09/26/2023 in [facility]  C-SSRS RISK CATEGORY No Risk No Risk No Risk        Assessment and Plan:   [name] [name]. [name] [name] is a  68 year old female with a past psychiatric history significant for insomnia, generalized anxiety disorder, and major depressive disorder who  presents to [facility] via virtual video visit for follow-up and medication management.  Patient presents to the encounter stating that she has been taking her medications regularly and denies experiencing any adverse side effects at this time.  She reports that she continues to use her clonazepam  as needed and denies experiencing any adverse side effects from taking the medication.  Patient denies overt depressive symptoms but continues to endorse some anxiety.  Patient reports that her medications keep her stable and would like to continue taking her medications as prescribed.  Patient's medications to be e-prescribed to pharmacy of choice.  Provider discussed with patient that she would need to be transferred out to a psychiatric outpatient facility that accepts her insurance.  Patient was informed that since she has Medicare, she would need to go to a facility that accepts Medicare.  Patient vocalized understanding.  Patient to be given a 90-day supply of her medications following the conclusion of the encounter.  Patient denies suicidal ideations and is able to contract for safety following the conclusion of the encounter.  Collaboration of Care: Collaboration of Care: Medication Management AEB patient's medications being managed by this provider and Psychiatrist AEB patient being managed by this behavioral health provider  Patient/Guardian was advised Release of Information must be obtained prior to any record release in order to collaborate their care with an outside provider. Patient/Guardian was advised if they have not already done so to contact the registration department to sign all necessary forms in order for us  to release information regarding their care.   Consent: Patient/Guardian gives verbal consent for treatment and assignment of benefits for services provided during this visit. Patient/Guardian expressed understanding and agreed to proceed.    1. GAD (generalized anxiety disorder)  - escitalopram  (LEXAPRO ) 20 MG tablet; Take 1 tablet (20 mg total) by mouth daily.  Dispense: 90 tablet; Refill: 0 - busPIRone  (BUSPAR ) 15 MG tablet; Take 2 tablets (30 mg total) by mouth in the morning AND 1 tablet (15 mg total) daily at 12 noon AND 1 tablet (15 mg total) every evening.  Dispense: 360 tablet; Refill: 0 - mirtazapine  (REMERON ) 45 MG tablet; Take 1 tablet (45 mg total) by mouth at bedtime.  Dispense: 90 tablet; Refill: 0  2. Major depressive disorder, recurrent, in full remission with anxious distress (HCC)  - escitalopram  (LEXAPRO ) 20 MG tablet; Take 1 tablet (20 mg total) by mouth daily.  Dispense: 90 tablet; Refill: 0 - busPIRone  (BUSPAR ) 15 MG tablet; Take 2 tablets (30 mg total) by mouth in the morning AND 1 tablet (15 mg total) daily at 12 noon AND 1 tablet (15 mg total) every evening.  Dispense: 360 tablet; Refill: 0 - mirtazapine  (REMERON ) 45 MG tablet; Take 1 tablet (45 mg total) by mouth at bedtime.  Dispense: 90 tablet; Refill: 0 - doxepin  (SINEQUAN ) 75 MG capsule; Take 1 capsule (75 mg total) by mouth at bedtime.  Dispense: 90 capsule; Refill: 0  3. Long-term current use of antidepressant (Primary)  - Hemoglobin A1c; Future - Lipid panel; Future  4. Long-term current use of benzodiazepine  - Urine Drug Panel 7; Future  Patient to be discharged from this facility due to having Medicare insurance Provider spent a total of 18 minutes with the patient/reviewing patient's chart  [name], PA 11/14/2023, 4:00 PM

## 2023-11-18 ENCOUNTER — Other Ambulatory Visit (HOSPITAL_COMMUNITY): Payer: Self-pay

## 2023-11-18 ENCOUNTER — Encounter: Payer: Self-pay | Admitting: Hematology

## 2023-11-25 ENCOUNTER — Encounter (HOSPITAL_COMMUNITY): Payer: Self-pay | Admitting: Emergency Medicine

## 2023-11-25 ENCOUNTER — Observation Stay (HOSPITAL_COMMUNITY)
Admission: EM | Admit: 2023-11-25 | Discharge: 2023-11-27 | Disposition: A | Discharge status: Home / Self Care | Attending: Internal Medicine | Admitting: Internal Medicine

## 2023-11-25 ENCOUNTER — Encounter: Payer: Self-pay | Admitting: Hematology

## 2023-11-25 ENCOUNTER — Other Ambulatory Visit: Payer: Self-pay

## 2023-11-25 ENCOUNTER — Emergency Department (HOSPITAL_COMMUNITY)

## 2023-11-25 DIAGNOSIS — F17210 Nicotine dependence, cigarettes, uncomplicated: Secondary | ICD-10-CM | POA: Insufficient documentation

## 2023-11-25 DIAGNOSIS — R109 Unspecified abdominal pain: Secondary | ICD-10-CM | POA: Insufficient documentation

## 2023-11-25 DIAGNOSIS — J9621 Acute and chronic respiratory failure with hypoxia: Secondary | ICD-10-CM | POA: Insufficient documentation

## 2023-11-25 DIAGNOSIS — G4700 Insomnia, unspecified: Secondary | ICD-10-CM | POA: Diagnosis not present

## 2023-11-25 DIAGNOSIS — Z79899 Other long term (current) drug therapy: Secondary | ICD-10-CM | POA: Diagnosis not present

## 2023-11-25 DIAGNOSIS — Z7982 Long term (current) use of aspirin: Secondary | ICD-10-CM | POA: Diagnosis not present

## 2023-11-25 DIAGNOSIS — Z91040 Latex allergy status: Secondary | ICD-10-CM | POA: Diagnosis not present

## 2023-11-25 DIAGNOSIS — I2510 Atherosclerotic heart disease of native coronary artery without angina pectoris: Secondary | ICD-10-CM | POA: Insufficient documentation

## 2023-11-25 DIAGNOSIS — Z955 Presence of coronary angioplasty implant and graft: Secondary | ICD-10-CM | POA: Insufficient documentation

## 2023-11-25 DIAGNOSIS — I10 Essential (primary) hypertension: Secondary | ICD-10-CM | POA: Diagnosis not present

## 2023-11-25 DIAGNOSIS — J441 Chronic obstructive pulmonary disease with (acute) exacerbation: Secondary | ICD-10-CM | POA: Diagnosis not present

## 2023-11-25 DIAGNOSIS — R062 Wheezing: Secondary | ICD-10-CM | POA: Diagnosis not present

## 2023-11-25 DIAGNOSIS — Z833 Family history of diabetes mellitus: Secondary | ICD-10-CM | POA: Insufficient documentation

## 2023-11-25 DIAGNOSIS — E039 Hypothyroidism, unspecified: Secondary | ICD-10-CM | POA: Diagnosis not present

## 2023-11-25 DIAGNOSIS — I110 Hypertensive heart disease with heart failure: Secondary | ICD-10-CM | POA: Diagnosis not present

## 2023-11-25 DIAGNOSIS — I700 Atherosclerosis of aorta: Secondary | ICD-10-CM | POA: Diagnosis not present

## 2023-11-25 DIAGNOSIS — I5032 Chronic diastolic (congestive) heart failure: Secondary | ICD-10-CM | POA: Diagnosis not present

## 2023-11-25 DIAGNOSIS — S42022A Displaced fracture of shaft of left clavicle, initial encounter for closed fracture: Secondary | ICD-10-CM | POA: Diagnosis not present

## 2023-11-25 DIAGNOSIS — I959 Hypotension, unspecified: Secondary | ICD-10-CM | POA: Diagnosis not present

## 2023-11-25 DIAGNOSIS — R0989 Other specified symptoms and signs involving the circulatory and respiratory systems: Secondary | ICD-10-CM | POA: Diagnosis not present

## 2023-11-25 DIAGNOSIS — E119 Type 2 diabetes mellitus without complications: Secondary | ICD-10-CM | POA: Insufficient documentation

## 2023-11-25 DIAGNOSIS — R0602 Shortness of breath: Secondary | ICD-10-CM | POA: Diagnosis present

## 2023-11-25 DIAGNOSIS — Z7984 Long term (current) use of oral hypoglycemic drugs: Secondary | ICD-10-CM | POA: Diagnosis not present

## 2023-11-25 DIAGNOSIS — Z7902 Long term (current) use of antithrombotics/antiplatelets: Secondary | ICD-10-CM | POA: Insufficient documentation

## 2023-11-25 DIAGNOSIS — F411 Generalized anxiety disorder: Secondary | ICD-10-CM | POA: Insufficient documentation

## 2023-11-25 DIAGNOSIS — Z79890 Hormone replacement therapy (postmenopausal): Secondary | ICD-10-CM | POA: Diagnosis not present

## 2023-11-25 DIAGNOSIS — R7303 Prediabetes: Secondary | ICD-10-CM | POA: Insufficient documentation

## 2023-11-25 DIAGNOSIS — Z1152 Encounter for screening for COVID-19: Secondary | ICD-10-CM | POA: Insufficient documentation

## 2023-11-25 DIAGNOSIS — J90 Pleural effusion, not elsewhere classified: Secondary | ICD-10-CM | POA: Diagnosis not present

## 2023-11-25 DIAGNOSIS — Z7952 Long term (current) use of systemic steroids: Secondary | ICD-10-CM | POA: Diagnosis not present

## 2023-11-25 DIAGNOSIS — R0689 Other abnormalities of breathing: Secondary | ICD-10-CM | POA: Diagnosis not present

## 2023-11-25 DIAGNOSIS — R918 Other nonspecific abnormal finding of lung field: Secondary | ICD-10-CM | POA: Diagnosis not present

## 2023-11-25 DIAGNOSIS — R0902 Hypoxemia: Secondary | ICD-10-CM | POA: Diagnosis not present

## 2023-11-25 LAB — TROPONIN I (HIGH SENSITIVITY): Troponin I (High Sensitivity): 9 ng/L (ref <18)

## 2023-11-25 LAB — RESP PANEL BY RT-PCR (RSV, FLU A&B, COVID)  RVPGX2
Influenza A by PCR: NEGATIVE
Influenza B by PCR: NEGATIVE
Resp Syncytial Virus by PCR: NEGATIVE
SARS Coronavirus 2 by RT PCR: NEGATIVE

## 2023-11-25 LAB — I-STAT VENOUS BLOOD GAS, ED
Acid-Base Excess: 6 mmol/L — ABNORMAL HIGH (ref 0.0–2.0)
Bicarbonate: 31.7 mmol/L — ABNORMAL HIGH (ref 20.0–28.0)
Calcium, Ion: 1.17 mmol/L (ref 1.15–1.40)
HCT: 37 % (ref 36.0–46.0)
Hemoglobin: 12.6 g/dL (ref 12.0–15.0)
O2 Saturation: 91 %
Potassium: 4.5 mmol/L (ref 3.5–5.1)
Sodium: 142 mmol/L (ref 135–145)
TCO2: 33 mmol/L — ABNORMAL HIGH (ref 22–32)
pCO2, Ven: 50.6 mmHg (ref 44–60)
pH, Ven: 7.405 (ref 7.25–7.43)
pO2, Ven: 63 mmHg — ABNORMAL HIGH (ref 32–45)

## 2023-11-25 LAB — CBC WITH DIFFERENTIAL/PLATELET
Abs Immature Granulocytes: 0.04 10*3/uL (ref 0.00–0.07)
Basophils Absolute: 0 10*3/uL (ref 0.0–0.1)
Basophils Relative: 0 %
Eosinophils Absolute: 0.1 10*3/uL (ref 0.0–0.5)
Eosinophils Relative: 1 %
HCT: 39.7 % (ref 36.0–46.0)
Hemoglobin: 12.4 g/dL (ref 12.0–15.0)
Immature Granulocytes: 0 %
Lymphocytes Relative: 33 %
Lymphs Abs: 3.9 10*3/uL (ref 0.7–4.0)
MCH: 31.6 pg (ref 26.0–34.0)
MCHC: 31.2 g/dL (ref 30.0–36.0)
MCV: 101.3 fL — ABNORMAL HIGH (ref 80.0–100.0)
Monocytes Absolute: 1.1 10*3/uL — ABNORMAL HIGH (ref 0.1–1.0)
Monocytes Relative: 9 %
Neutro Abs: 6.6 10*3/uL (ref 1.7–7.7)
Neutrophils Relative %: 57 %
Platelets: 397 10*3/uL (ref 150–400)
RBC: 3.92 MIL/uL (ref 3.87–5.11)
RDW: 14.8 % (ref 11.5–15.5)
WBC: 11.8 10*3/uL — ABNORMAL HIGH (ref 4.0–10.5)
nRBC: 0 % (ref 0.0–0.2)

## 2023-11-25 LAB — BASIC METABOLIC PANEL WITH GFR
Anion gap: 9 (ref 5–15)
BUN: 6 mg/dL — ABNORMAL LOW (ref 8–23)
CO2: 30 mmol/L (ref 22–32)
Calcium: 9.3 mg/dL (ref 8.9–10.3)
Chloride: 105 mmol/L (ref 98–111)
Creatinine, Ser: 0.75 mg/dL (ref 0.44–1.00)
GFR, Estimated: >60 mL/min (ref >60)
Glucose, Bld: 108 mg/dL — ABNORMAL HIGH (ref 70–99)
Potassium: 3.7 mmol/L (ref 3.5–5.1)
Sodium: 144 mmol/L (ref 135–145)

## 2023-11-25 LAB — BRAIN NATRIURETIC PEPTIDE: B Natriuretic Peptide: 84.1 pg/mL (ref 0.0–100.0)

## 2023-11-25 MED ORDER — IPRATROPIUM-ALBUTEROL 0.5-2.5 (3) MG/3ML IN SOLN
3.0000 mL | Freq: Once | RESPIRATORY_TRACT | Status: AC
  Administered 2023-11-25: 3 mL via RESPIRATORY_TRACT
  Filled 2023-11-25: qty 3

## 2023-11-25 MED ORDER — RIVAROXABAN 10 MG PO TABS: 10.0000 mg | ORAL_TABLET | Freq: Every day | ORAL | Status: DC

## 2023-11-25 MED ORDER — ALBUTEROL SULFATE (2.5 MG/3ML) 0.083% IN NEBU
7.5000 mg | INHALATION_SOLUTION | Freq: Once | RESPIRATORY_TRACT | Status: AC
  Administered 2023-11-25: 7.5 mg via RESPIRATORY_TRACT
  Filled 2023-11-25: qty 9

## 2023-11-25 MED ORDER — NICOTINE 21 MG/24HR TD PT24
21.0000 mg | MEDICATED_PATCH | Freq: Every day | TRANSDERMAL | Status: DC
  Administered 2023-11-26 – 2023-11-27 (×3): 21 mg via TRANSDERMAL
  Filled 2023-11-25 (×3): qty 1

## 2023-11-25 NOTE — ED Triage Notes (Signed)
 Pt BIB EMS from home with c/o increased SHOB x a few days. Wears 2lpm chronically, was 88% on 2lpm upon ems arrival.  2 x duoneb 125mg  solumedrol 2g mag  18g LFA

## 2023-11-25 NOTE — H&P (Signed)
 Date: 11/26/2023               Patient Name:  [name] [name] MRN: [id number]  DOB: Nov 07, 1955 Age / Sex: 68 y.o., female   PCP: Lawrance Presume, MD         Medical Service: Internal Medicine Teaching Service         Attending Physician: Dr. Priscella Brooms, DO      First Contact: Dr. Carleen Chary, DO Pager [phone]    Second Contact: Dr. Jearldine Mina, DO          After Hours (After 5p/  First Contact Pager: [phone]  weekends / holidays): Second Contact Pager: [phone]   SUBJECTIVE   Chief Complaint: Shortness of breath   History of Present Illness: POLLYANN COCKE is a 68 year old female with a past medical history of COPD, Chronic hypoxic respiratory failure on 2-3L supplemental oxygen  at home, anxiety, hypothyroidism, HFpEF LVEF 60-65%  who presented to the emergency department for shortness of breath.  During initial conversation, patient is quite somnolent and did not participate in exam.  Once patient was more awake, she did report shortness of breath that started earlier today.  Patient also reported fever, chills and a cough that started earlier today as well. She reports dysuria as well.  I spoke with the daughter on the phone the phone and stated that the patient does miss medication and inhaler treatments at home.  The daughter manages her medications because the patient's memory is "slipping".  The daughter reported that her mother has not been around any sick contacts and has not complained to her about any other upper respiratory infection complaint such as nasal congestion.  She last saw her mother on Sunday and stated that she started to sound raspy then and that the patient did express some concerns about lower extremity swelling today.   Review of Systems: A complete ROS was negative except as per HPI.   ED Course: Patient was brought in by ambulance. After she called 911 for feeling short of breath.  When EMS arrived, her oxygen  saturation was 88% and she  received a dose of Solu-Medrol .  In the emergency department,she was initially on 4 L of supplemental oxygen  and received breathing treatments.  Pertinent lab findings: WBC 11.8, BNP 84.1, RSV, influenza A and B, RSV negative, VBG revealed a pH of 7.405, bicarbonate of 31.7, CO2 50.6, oxygen  of 63.  Chest x-ray revealed low lung volumes with mild to moderate severity bibasilar scarring and/or atelectasis.  Past Medical History: COPD  Chronic hypoxic respiratory failureon 2L supplemental oxygen  at home Anxiety Insomnia Hypothyroidism HFpEF LVEF 60-65 % (December 2024) Prediabetes  CAD s/p LAD stent   Meds:  Trelegy one puff once per day Dueonebs BID Tylenol  prn Aspirin  81 mg daily  Albuterol  inhaler 2 puff every 6 hours as needed Alendronate  70mg  weekly Amlodipine  5mg  daily Brilinta  60mg  BID  Klonopin  0.5mg  BID Metformin  500mg  BID Losartan  75mg  daily Citalopram 20mg  in the morning Gabapentin  600mg  BID Lasix  20mg  daily  Synthroid  112 mcg daily  Omeprazole  40mg  BID   Doxepin  75mg  at bedtime  Zetia  10mg  nightly Bystolic  10mg  nightly  Remeron  45mg  nightly  Buspar  60mg  nightly  Singulair  10mg  nightly  Atorvastain 80mg  nightly    Allergies: Allergies as of 11/25/2023 - Review Complete 11/25/2023  Allergen Reaction Noted   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 Howard Macho: Mild diverticulosis, descending diminutive colon polyp (not retrieved), next colonoscopy 10 years   CORONARY ANGIOPLASTY WITH STENT PLACEMENT  2011 X2   "regular stents didn't work; had  to go back in in ~ 1 month and put in medicated stents"   DILATION AND CURETTAGE OF UTERUS     ESOPHAGOGASTRODUODENOSCOPY     ESOPHAGOGASTRODUODENOSCOPY (EGD) WITH PROPOFOL  N/A 05/27/2019   normal esophagus, dilation, erosive gastropathy s/p biopsy, normal duodenum. Negative H.pylori.    LEFT HEART CATH AND CORONARY ANGIOGRAPHY N/A 03/26/2017   Procedure: LEFT HEART CATH AND CORONARY ANGIOGRAPHY;  Surgeon: Arty Binning, MD;  Location: [facility];  Service: Cardiovascular;  Laterality: N/A;   LUMBAR LAMINECTOMY/DECOMPRESSION MICRODISCECTOMY  08/05/2011   Procedure: LUMBAR LAMINECTOMY/DECOMPRESSION MICRODISCECTOMY;  Surgeon: Shary Deems, MD;  Location: [facility];  Service: Neurosurgery;  Laterality: Right;  Right Lumbar four-five extraforaminal discectomy   MALONEY DILATION N/A 05/27/2019   Procedure: Londa Rival DILATION;  Surgeon: Suzette Espy, MD;  Location: [facility];  Service: Endoscopy;  Laterality: N/A;  54   SHOULDER ARTHROSCOPY WITH ROTATOR CUFF REPAIR AND OPEN BICEPS TENODESIS Right 12/24/2021   Procedure: RIGHT SHOULDER ARTHROSCOPY WITH ROTATOR CUFF REPAIR AND BICEPS TENODESIS;  Surgeon: Adah Acron, MD;  Location: [facility];  Service: Orthopedics;  Laterality: Right;   TONSILLECTOMY     as a child   UPPER GASTROINTESTINAL ENDOSCOPY      Social:  Lives With: Herself ZOX:WRUEAVW, Rexine Cater, MD Substances: Patient reports smoking a pack of cigarettes per day  Family History:  Family History  Problem Relation Age of Onset   Heart disease Mother    Hypertension Mother    Stroke Mother    Mental illness Mother    Heart disease Father    Hypertension Father    Diabetes Father    Colon polyps Brother    Heart disease Maternal Grandfather    Cancer Paternal Grandmother        mouth   Anesthesia problems Daughter    Breast cancer Maternal Aunt    Throat cancer Maternal Uncle    Thyroid  disease Paternal Aunt    Hypotension Neg Hx    Malignant hyperthermia Neg  Hx    Pseudochol deficiency Neg Hx    Colon cancer Neg Hx    Stomach cancer Neg Hx    Esophageal cancer Neg Hx    Pancreatic cancer Neg Hx    Rectal cancer Neg Hx       OBJECTIVE:   Physical Exam: Blood pressure (!) 112/54, pulse 79, temperature 98.2 F (36.8 C), resp. rate 19, height 4\' 9"  (1.448 m), weight 69 kg, SpO2 100%.  Constitutional: Chronically ill-appearing female lying in bed, in no acute distress HENT: normocephalic atraumatic Cardiovascular: regular rate and rhythm, no m/r/g Pulmonary/Chest: Normal work of breathing on room air, patient was saturating at mid 90s while on room air and speaking with us , bilateral basilar expiratory wheezes auscultated Abdominal: soft, super pubic tenderness, not distended Neurological: alert & oriented x 3 MSK: no gross abnormalities.  No pitting edema  Skin: warm and dry Psych: Normal mood and affect  During initial exam, patient was quite somnolent and abruptly woke up while we are trying to do a neuroexam After she was fully awake, she was alert and oriented x 3 and participating conversation with appropriate responses.  She was able to move all 4 extremities spontaneously.  Labs:    Latest Ref Rng & Units 11/25/2023   11:18 PM 11/25/2023    7:47 PM 11/11/2023    6:19 PM  CBC  WBC 4.0 - 10.5 K/uL  11.8  12.4   Hemoglobin 12.0 - 15.0 g/dL 40.9  81.1  91.4   Hematocrit 36.0 - 46.0 % 37.0  39.7  41.8   Platelets 150 - 400 K/uL  397  523         Latest Ref Rng & Units 11/25/2023   11:18 PM 11/25/2023    7:47 PM 11/12/2023    3:05 AM  CMP  Glucose 70 - 99 mg/dL  782  77   BUN 8 - 23 mg/dL  6  18   Creatinine 9.56 - 1.00 mg/dL  2.13  0.86   Sodium 578 - 145 mmol/L 142  144  139   Potassium 3.5 - 5.1 mmol/L 4.5  3.7  3.5   Chloride 98 - 111 mmol/L  105  105   CO2 22 - 32 mmol/L  30  26   Calcium  8.9 - 10.3 mg/dL  9.3  8.1      Imaging: DG Chest Portable 1 View Result Date: 11/25/2023 CLINICAL DATA:  Shortness of breath.  EXAM: PORTABLE CHEST 1 VIEW COMPARISON:  November 11, 2023 FINDINGS: The heart size and mediastinal contours are within normal limits. There is marked severity calcification of the aortic arch. Low lung volumes are noted. Mild to moderate severity areas of scarring and/or atelectasis are seen within the bilateral lung bases. There is a small left pleural effusion. No pneumothorax is identified. There is a chronic fracture of the mid left clavicle. IMPRESSION: 1. Low lung volumes with mild to moderate severity bibasilar scarring and/or atelectasis. 2. Small left pleural effusion. Electronically Signed   By: Virgle Grime M.D.   On: 11/25/2023 20:41      EKG: personally reviewed my interpretation is NSR with a Qtc of 441. Prior EKG is NSR  ASSESSMENT & PLAN:   Assessment & Plan by Problem: Principal Problem:   COPD exacerbation (HCC)   [name] [name] is a 68 y.o. person living with a history of COPD, Chronic hypoxic respiratory failure on 2-3L supplemental oxygen  at home, anxiety, hypothyroidism, HFpEF LVEF 60-65%  who presented to the emergency department for shortness of breath. and admitted for COPD exacerbation on hospital day 0  COPD exacerbation Patient presented to emergency department with shortness of breath and cough with an increased supplemental oxygen  requirement.  Physical exam was remarkable for bibasilar expiratory wheezes.  Fortunately, patient was on baseline oxygen  of 2 L when elevated her and she was able to maintain her oxygen  saturations above 92% while conversing on room air.  I suspect that her COPD exacerbation was due to medication noncompliance after speaking with her daughter.  Patient is negative for COVID, influenza, and RSV. Plan: -Breztri 2 puffs twice daily ordered as an alternative to her home Trelegy 1 puff daily -DuoNebs every 6 hours as needed -Prednisone  40mg  daily for 4 days, s/p solumedrol  -Respiratory viral panel ordered -With her history of QTc  prolongation and use of many QTc  prolonged gating medications for other chronic medical conditions and with her saturating well on her home oxygen  requirements not in acute respiratory distress, will hold off on azithromycin  at this point - She will require ambulatory pulse oximetry tomorrow morning -If patient continues to do well, she may be able discharge home tomorrow morning  Suprapubic abdominal Pain Patient reports dysuria and has suprapubic tenderness on exam.  Will order UA to evaluate for UTI.  HFpEF, LVEF 60 to 65% Patient appears euvolemic on exam, will continue her home Lasix  20 mg daily, losartan  75 mg daily, and Bystolic  10 mg nightly - Holding Bystolic  10 mg tonight due to diastolic blood pressure being lower than 60  Generalized anxiety disorder Insomnia  Patient is on a home regimen including Klonopin  0.5 twice a day, Remeron  45 mg nightly, BuSpar  60 mg nightly, doxepin  75 mg nightly, escitalopram  20 mg in the morning.  Due to her increased somnolence during her initial exam, we will be holding the nighttime antianxiety medications including BuSpar  60 mg and Klonopin  0.5 mg tonight - May resume regimen morning  Hypothyroidism Continue home levothyroxine  112 mcg Prediabetes continue metformin  500 mg BID  CAD s/p LAD stent with evidence of rethrombosis Continue aspirin  81 mg daily, Brilinta  60 mg twice daily, lipitor  80mg , zetia  10 mg   Tobacco use 21 mg nicotine  patch ordered   Diet: Normal VTE: Lovenox   Code: DNR/DNI  Prior to Admission Living Arrangement: Home, living with self Anticipated Discharge Location: Home Barriers to Discharge: Home  Dispo: Admit patient to Observation with expected length of stay less than 2 midnights.  Signed:   Aurora Lees, DO Internal Medicine Resident PGY-1 11/26/2023, 12:53 AM   Please contact the on call pager at [phone].

## 2023-11-25 NOTE — H&P (Incomplete)
 Date: 11/25/2023               Patient Name:  [name] [name] MRN: [id number]  DOB: [birth year] Age / Sex: 68 y.o., female   PCP: Lawrance Presume, MD         Medical Service: Internal Medicine Teaching Service         Attending Physician: Dr. Karlyn Overman, MD      First Contact: {[id number]/[id number]}    Second Contact: Dr. Jearldine Mina, DO          After Hours (After 5p/  First Contact Pager: [phone]  weekends / holidays): Second Contact Pager: [phone]   SUBJECTIVE   Chief Complaint: Shortness of breath   History of Present Illness: [name] [name] is a 68 year old female with a past medical history of COPD, Chronic hypoxic respiratory failure on 2-3L supplemental oxygen  at home, anxiety, hypothyroidism, HFpEF LVEF 60-65%  who presented to the emergency department for ***   Fever, chills, cough (productive?)  On last duonebs Patient is not consistently, memory is slipping: struggling with meds as well  Sick contacts: none, no other URI complaints    Daughter: saw on Sunday, started to sound raspy, retaining fluid today? Weight went up by either 4-5 pounds today felt that her legs were swollen, dry weight: 145, has been 150, 152 pounds today   Last klonopin  dose was this am   Review of Systems: A complete ROS was negative except as per HPI.   ED Course:  Past Medical History: COPD  Chronic hypoxic respiratory failureon 2L supplemental oxygen  at home Anxiety Hypothyroidism HFpEF LVEF 60-65 % (December 2024) Prediabetes  CAD s/p LAD stent   Meds:  Trelegy one puff once per day Dueonebs BID Tylenol  prn Aspirin  81 mg daily  Albuterol  inhaler 2 puff every 6 hours as needed Alendronate  70mg  weekly Amlodipine  5mg  daily Brilinta  60mg  BID  Klonopin  0.5mg  BID Metformin  500mg  BID Losartan  75mg  daily Citalopram 20mg  in the morning Gabapentin  600mg  BID Lasix  20mg  daily  Synthroid  112 mcg daily  Omeprazole  40mg  BID   Doxepin  75mg  at bedtime  Zetia  10mg   nightly Bystolic  10mg  nightly  Remeron  45mg  nightly  Buspar  60mg  nightly  Singulair  10mg  nightly  Atorvastain 80mg  nightly    Allergies: Allergies as of 11/25/2023 - Review Complete 11/25/2023  Allergen Reaction Noted  . 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 Latex Shortness Of Breath and Rash 12/22/2008  . Codeine  Nausea Only 12/22/2008    Past Surgical History:  Procedure Laterality Date  . ABDOMINAL EXPLORATION SURGERY  1977  . ABDOMINAL HYSTERECTOMY  1977   "left one of my ovaries"  . BACK SURGERY    . BIOPSY  05/27/2019   Procedure: BIOPSY;  Surgeon: Suzette Espy, MD;  Location: [facility];  Service: Endoscopy;;  . BREAST BIOPSY Left 11/2021   x2  . COLONOSCOPY  2011   Dr. Howard Macho: Mild diverticulosis, descending diminutive colon polyp (not retrieved), next colonoscopy 10 years  . CORONARY ANGIOPLASTY WITH STENT PLACEMENT  2011 X2   "regular stents didn't work; had to go back in in ~ 1 month and put in medicated stents"  . DILATION AND CURETTAGE OF UTERUS    . ESOPHAGOGASTRODUODENOSCOPY    . ESOPHAGOGASTRODUODENOSCOPY (EGD) WITH PROPOFOL  N/A 05/27/2019   normal esophagus, dilation, erosive gastropathy s/p biopsy, normal duodenum. Negative H.pylori.   Aaron Aas LEFT HEART CATH AND CORONARY ANGIOGRAPHY N/A 03/26/2017   Procedure: LEFT HEART CATH AND CORONARY ANGIOGRAPHY;  Surgeon: Kay Parson  W, MD;  Location: [facility];  Service: Cardiovascular;  Laterality: N/A;  . LUMBAR LAMINECTOMY/DECOMPRESSION MICRODISCECTOMY  08/05/2011   Procedure: LUMBAR LAMINECTOMY/DECOMPRESSION MICRODISCECTOMY;  Surgeon: Shary Deems, MD;  Location: [facility];  Service: Neurosurgery;  Laterality: Right;  Right Lumbar four-five extraforaminal discectomy  . MALONEY DILATION N/A 05/27/2019   Procedure: Londa Rival DILATION;  Surgeon: Suzette Espy, MD;  Location: [facility];  Service: Endoscopy;  Laterality: N/A;  54  . SHOULDER ARTHROSCOPY WITH ROTATOR CUFF REPAIR AND OPEN BICEPS TENODESIS  Right 12/24/2021   Procedure: RIGHT SHOULDER ARTHROSCOPY WITH ROTATOR CUFF REPAIR AND BICEPS TENODESIS;  Surgeon: Adah Acron, MD;  Location: [facility];  Service: Orthopedics;  Laterality: Right;  . TONSILLECTOMY     as a child  . UPPER GASTROINTESTINAL ENDOSCOPY      Social:  Lives With: Occupation: Support: Level of Function: PCP: Substances:  Family History: ***   OBJECTIVE:   Physical Exam: Blood pressure (!) 112/54, pulse 79, temperature 98.2 F (36.8 C), resp. rate 19, height 4\' 9"  (1.448 m), weight 69 kg, SpO2 100%.  Constitutional: well-appearing *** sitting in ***, in no acute distress HENT: normocephalic atraumatic, mucous membranes moist Cardiovascular: regular rate and rhythm, no m/r/g, *** JVD Pulmonary/Chest: normal work of breathing on room air, lungs clear to auscultation bilaterally. ***crackles  Abdominal: soft, non-tender, non-distended. ***fluid wave ***asterixis Neurological: alert & oriented x 3 MSK: no gross abnormalities. ***pitting edema Skin: warm and dry Psych: Normal mood and affect  Labs:    Latest Ref Rng & Units 11/25/2023    7:47 PM 11/11/2023    6:19 PM 10/08/2023    4:53 AM  CBC  WBC 4.0 - 10.5 K/uL 11.8  12.4  14.2   Hemoglobin 12.0 - 15.0 g/dL 82.9  56.2  13.0   Hematocrit 36.0 - 46.0 % 39.7  41.8  36.0   Platelets 150 - 400 K/uL 397  523  547         Latest Ref Rng & Units 11/25/2023    7:47 PM 11/12/2023    3:05 AM 11/11/2023    6:19 PM  CMP  Glucose 70 - 99 mg/dL 865  77  96   BUN 8 - 23 mg/dL 6  18  21    Creatinine 0.44 - 1.00 mg/dL 7.84  6.96  2.95   Sodium 135 - 145 mmol/L 144  139  138   Potassium 3.5 - 5.1 mmol/L 3.7  3.5  3.9   Chloride 98 - 111 mmol/L 105  105  101   CO2 22 - 32 mmol/L 30  26  26    Calcium  8.9 - 10.3 mg/dL 9.3  8.1  8.9   Total Protein 6.5 - 8.1 g/dL   6.4   Total Bilirubin 0.0 - 1.2 mg/dL   0.5   Alkaline Phos 38 - 126 U/L   76   AST 15 - 41 U/L   15   ALT 0 - 44 U/L   14      Imaging: DG Chest  Portable 1 View Result Date: 11/25/2023 CLINICAL DATA:  Shortness of breath. EXAM: PORTABLE CHEST 1 VIEW COMPARISON:  November 11, 2023 FINDINGS: The heart size and mediastinal contours are within normal limits. There is marked severity calcification of the aortic arch. Low lung volumes are noted. Mild to moderate severity areas of scarring and/or atelectasis are seen within the bilateral lung bases. There is a small left pleural effusion. No pneumothorax is identified.  There is a chronic fracture of the mid left clavicle. IMPRESSION: 1. Low lung volumes with mild to moderate severity bibasilar scarring and/or atelectasis. 2. Small left pleural effusion. Electronically Signed   By: Virgle Grime M.D.   On: 11/25/2023 20:41      EKG: personally reviewed my interpretation is NSR with a Qtc of 441. Prior EKG is NSR  ASSESSMENT & PLAN:   Assessment & Plan by Problem: Active Problems:   * No active hospital problems. *   [name] [name] is a 68 y.o. person living with a history of *** who presented with *** and admitted for *** on hospital day 0  1.  2.  3.  4.  5.  6.   Diet: {NAMES:3044014::"Normal","Heart Healthy","Carb-Modified","Renal","Carb/Renal","NPO","TPN","Tube Feeds"} VTE: {NAMES:3044014::"Heparin ","Enoxaparin ","SCDs","NOAC","None"} Code: {NAMES:3044014::"Full","DNR","DNI","DNR/DNI","Comfort Care","Unknown"}  Prior to Admission Living Arrangement: {NAMES:3044014::"Home, living ***","SNF, ***","Homeless","***"} Anticipated Discharge Location: {NAMES:3044014::"Home","SNF","CIR","***"} Barriers to Discharge: ***  Dispo: Admit patient to {STATUS:3044014::"Observation with expected length of stay less than 2 midnights.","Inpatient with expected length of stay greater than 2 midnights."}  Signed:   Aurora Lees, DO Internal Medicine Resident PGY-1 11/25/2023, 10:28 PM   Please contact the on call pager at [phone].

## 2023-11-25 NOTE — ED Provider Notes (Signed)
  EMERGENCY DEPARTMENT AT [facility] Provider Note   CSN: [id number] Arrival date & time: 11/25/23  1945    History  Chief Complaint  Patient presents with   Shortness of Breath    [name] [name] is a 68 y.o. female chronic hypoxic respiratory failure 2 L nasal cannula due to COPD, prior history of pneumothorax, tobacco use, CAD, asthma here for evaluation of shortness of breath.  Started 2 days ago noticed having increase in her chronic cough.  Using home DuoNebs and rescue inhalers without relief, today only had 1 dose of her DuoNeb left which she use.  No recent steroids.  Has been admitted for similar previously.  Feels like her prior COPD exacerbation.  No fever, chest pain, back pain, abdominal pain, nausea, vomiting, pain or swelling to lower extremities.  No recent illnesses.  No sick contacts.  Still smoking cigarettes.  With EMS she received 125 Solu-Medrol , 2 g magnesium , DuoNeb x 2.  Found to be hypoxic on her home oxygen  with EMS however per EMS she had "very long tubing."  HPI     Home Medications Prior to Admission medications   Medication Sig Start Date End Date Taking? Authorizing Provider  acetaminophen  (TYLENOL ) 500 MG tablet Take 2 tablets (1,000 mg total) by mouth every 6 (six) hours. Patient taking differently: Take 1,000 mg by mouth in the morning and at bedtime. 09/25/23   Elwin Hammond, PA-C  albuterol  (VENTOLIN  HFA) 108 (90 Base) MCG/ACT inhaler INHALE 2 PUFFS into THE lungs EVERY 6 HOURS AS NEEDED wheezing AND SHORTNESS OF BREATH 10/15/23   Lawrance Presume, MD  alendronate  (FOSAMAX ) 70 MG tablet Take 1 tablet (70 mg total) by mouth every 7 (seven) days. Take with a full glass of water on an empty stomach. 10/25/23   Lawrance Presume, MD  amLODipine  (NORVASC ) 5 MG tablet Take 1 tablet (5 mg total) by mouth daily. Patient taking differently: Take 5 mg by mouth in the morning. 09/22/23   Jude Norton, NP  amoxicillin -clavulanate  (AUGMENTIN ) 875-125 MG tablet Take 1 tablet by mouth every 12 (twelve) hours. 11/12/23   Spence Dux, PA-C  aspirin  EC 81 MG tablet Take 81 mg by mouth in the morning.    [provider]  atorvastatin  (LIPITOR ) 80 MG tablet Take 1 tablet (80 mg total) by mouth daily. Patient taking differently: Take 80 mg by mouth in the morning. 09/22/23   Jude Norton, NP  Blood Glucose Monitoring Suppl (ACCU-CHEK GUIDE) w/Device KIT Use to check blood sugars once a day 09/05/23   Lawrance Presume, MD  busPIRone  (BUSPAR ) 15 MG tablet Take 2 tablets (30 mg total) by mouth in the morning AND 1 tablet (15 mg total) daily at 12 noon AND 1 tablet (15 mg total) every evening. 11/17/23   Nwoko, Uchenna E, PA  clonazePAM  (KLONOPIN ) 0.5 MG tablet Take 1 tablet (0.5 mg total) by mouth 3 (three) times daily as needed for anxiety. Patient taking differently: Take 0.5 mg by mouth in the morning and at bedtime. 09/26/23   Nwoko, Uchenna E, PA  docusate sodium  (COLACE) 100 MG capsule Take 1 capsule (100 mg total) by mouth 2 (two) times daily as needed for mild constipation. 09/25/23   Elwin Hammond, PA-C  doxepin  (SINEQUAN ) 75 MG capsule Take 1 capsule (75 mg total) by mouth at bedtime. 11/17/23   Nwoko, Uchenna E, PA  escitalopram  (LEXAPRO ) 20 MG tablet Take 1 tablet (20 mg total) by  mouth daily. 11/17/23   Nwoko, Uchenna E, PA  ezetimibe  (ZETIA ) 10 MG tablet Take 1 tablet (10 mg total) by mouth daily. Patient taking differently: Take 10 mg by mouth at bedtime. 07/04/23   Meng, Hao, PA  Fluticasone -Umeclidin-Vilant (TRELEGY ELLIPTA ) 200-62.5-25 MCG/ACT AEPB Inhale 1 puff into the lungs daily. Patient taking differently: Inhale 1 puff into the lungs in the morning. 08/07/23   Wilfredo Hanly, MD  furosemide  (LASIX ) 20 MG tablet Take 1 tablet (20 mg total) by mouth daily. Patient taking differently: Take 20 mg by mouth in the morning. 09/22/23   Jude Norton, NP  gabapentin  (NEURONTIN ) 600 MG tablet TAKE 1 TABLET BY  MOUTH 4 TIMES DAILY Patient taking differently: Take 600 mg by mouth 2 (two) times daily. 12/20/22   Lawrance Presume, MD  glucose blood (TRUE METRIX BLOOD GLUCOSE TEST) test strip Use as instructed to check blood sugars once a day 09/05/23   Lawrance Presume, MD  ipratropium-albuterol  (DUONEB) 0.5-2.5 (3) MG/3ML SOLN Take 3 mLs by nebulization every 8 (eight) hours. 11/11/23   Lawrance Presume, MD  levothyroxine  (SYNTHROID ) 112 MCG tablet Take 1 tablet (112 mcg total) by mouth daily. Patient taking differently: Take 112 mcg by mouth daily before breakfast. 12/20/22   Lawrance Presume, MD  lidocaine  (LIDODERM ) 5 % Place 1 patch onto the skin daily. Remove & Discard patch within 12 hours or as directed by MD Patient not taking: Reported on 11/12/2023 09/25/23   Elwin Hammond, PA-C  losartan  (COZAAR ) 50 MG tablet Take 1.5 tablets (75 mg total) by mouth daily. 11/12/23   Jude Norton, NP  metFORMIN  (GLUCOPHAGE ) 500 MG tablet Take 1 tablet (500 mg total) by mouth 2 (two) times daily with a meal. 11/12/23   Lawrance Presume, MD  mirtazapine  (REMERON ) 45 MG tablet Take 1 tablet (45 mg total) by mouth at bedtime. 11/17/23   Nwoko, Uchenna E, PA  montelukast  (SINGULAIR ) 10 MG tablet Take 1 tablet (10 mg total) by mouth at bedtime. 11/11/23   Lawrance Presume, MD  nebivolol  (BYSTOLIC ) 10 MG tablet Take 1 tablet (10 mg total) by mouth daily. Patient taking differently: Take 10 mg by mouth at bedtime. 09/22/23   Jude Norton, NP  nicotine  (NICODERM CQ  - DOSED IN MG/24 HOURS) 14 mg/24hr patch Place 1 patch (14 mg total) onto the skin daily. 10/09/23   Leona Rake, MD  nitroGLYCERIN  (NITROSTAT ) 0.4 MG SL tablet Place 1 tablet (0.4 mg total) under the tongue every 5 (five) minutes as needed for chest pain. 09/18/22 08/31/24  Jude Norton, NP  omeprazole  (PRILOSEC) 40 MG capsule Take 1 capsule (40 mg total) by mouth 2 (two) times daily before a meal. 11/06/23   Danis, Cordelia Dessert, MD  ondansetron   (ZOFRAN -ODT) 4 MG disintegrating tablet Take 1 tablet (4 mg total) by mouth every 8 (eight) hours as needed for nausea or vomiting. 11/12/23   Spence Dux, PA-C  polyethylene glycol (MIRALAX  / GLYCOLAX ) 17 g packet Take 17 g by mouth daily as needed for severe constipation. 09/25/23   Elwin Hammond, PA-C  predniSONE  (DELTASONE ) 10 MG tablet Take 4 tablets (40mg ) daily for 2 days, then 3 tablets (30mg ) daily for 2 days, then 2 tablets (20mg ) daily for 2 days, then 1 tablet (10mg ) daily for 2 days, then stop Patient not taking: Reported on 11/12/2023 10/09/23   Leona Rake, MD  Spacer/Aero-Holding Ismael Maria Use with the albuterol  inhaler. 07/22/23  Adria Hopkins, MD  ticagrelor  (BRILINTA ) 60 MG TABS tablet Take 1 tablet (60 mg total) by mouth 2 (two) times daily. 11/12/23   Jude Norton, NP  TRUEplus Lancets 28G MISC Use to check blood sugars once a day 09/05/23   Lawrance Presume, MD      Allergies    Avocado, Latex, and Codeine     Review of Systems   Review of Systems  Unable to perform ROS: Severe respiratory distress  HENT: Negative.    Respiratory:  Positive for cough, shortness of breath and wheezing.   Cardiovascular: Negative.   Gastrointestinal: Negative.   Genitourinary: Negative.   Musculoskeletal: Negative.   Skin: Negative.   Neurological: Negative.   All other systems reviewed and are negative.   Physical Exam Updated Vital Signs BP (!) 112/54   Pulse 79   Temp 98.2 F (36.8 C)   Resp 19   Ht 4\' 9"  (1.448 m)   Wt 69 kg   SpO2 100%   BMI 32.92 kg/m  Physical Exam Vitals and nursing note reviewed.  Constitutional:      General: She is not in acute distress.    Appearance: She is well-developed. She is ill-appearing (chronically ill appearing). She is not toxic-appearing or diaphoretic.  HENT:     Head: Atraumatic.  Eyes:     Pupils: Pupils are equal, round, and reactive to light.  Cardiovascular:     Rate and Rhythm: Normal rate.     Pulses:  Normal pulses.          Radial pulses are 2+ on the right side and 2+ on the left side.       Posterior tibial pulses are 2+ on the right side and 2+ on the left side.     Heart sounds: Normal heart sounds.  Pulmonary:     Effort: Tachypnea and respiratory distress present.     Breath sounds: Decreased breath sounds and wheezing present.  Chest:     Chest wall: No mass or tenderness.  Abdominal:     General: Bowel sounds are normal. There is no distension.     Palpations: Abdomen is soft.     Tenderness: There is no abdominal tenderness. There is no guarding or rebound.  Musculoskeletal:        General: Normal range of motion.     Cervical back: Normal range of motion.     Right lower leg: No tenderness. No edema.     Left lower leg: No tenderness. No edema.  Skin:    General: Skin is warm and dry.     Capillary Refill: Capillary refill takes less than 2 seconds.  Neurological:     General: No focal deficit present.     Mental Status: She is alert.  Psychiatric:        Mood and Affect: Mood normal.    ED Results / Procedures / Treatments   Labs (all labs ordered are listed, but only abnormal results are displayed) Labs Reviewed  CBC WITH DIFFERENTIAL/PLATELET - Abnormal; Notable for the following components:      Result Value   WBC 11.8 (*)    MCV 101.3 (*)    Monocytes Absolute 1.1 (*)    All other components within normal limits  BASIC METABOLIC PANEL WITH GFR - Abnormal; Notable for the following components:   Glucose, Bld 108 (*)    BUN 6 (*)    All other components within normal limits  RESP PANEL BY RT-PCR (  RSV, FLU A&B, COVID)  [id number]  BRAIN NATRIURETIC PEPTIDE  TROPONIN I (HIGH SENSITIVITY)    EKG None  Radiology DG Chest Portable 1 View Result Date: 11/25/2023 CLINICAL DATA:  Shortness of breath. EXAM: PORTABLE CHEST 1 VIEW COMPARISON:  November 11, 2023 FINDINGS: The heart size and mediastinal contours are within normal limits. There is marked severity  calcification of the aortic arch. Low lung volumes are noted. Mild to moderate severity areas of scarring and/or atelectasis are seen within the bilateral lung bases. There is a small left pleural effusion. No pneumothorax is identified. There is a chronic fracture of the mid left clavicle. IMPRESSION: 1. Low lung volumes with mild to moderate severity bibasilar scarring and/or atelectasis. 2. Small left pleural effusion. Electronically Signed   By: Virgle Grime M.D.   On: 11/25/2023 20:41    Procedures .Critical Care  Performed by: Dickson Founds, PA-C Authorized by: Dickson Founds, PA-C   Critical care provider statement:    Critical care time (minutes):  35   Critical care was necessary to treat or prevent imminent or life-threatening deterioration of the following conditions:  Respiratory failure   Critical care was time spent personally by me on the following activities:  Development of treatment plan with patient or surrogate, discussions with consultants, evaluation of patient's response to treatment, examination of patient, ordering and review of laboratory studies, ordering and review of radiographic studies, ordering and performing treatments and interventions, pulse oximetry, re-evaluation of patient's condition and review of old charts     Medications Ordered in ED Medications  ipratropium-albuterol  (DUONEB) 0.5-2.5 (3) MG/3ML nebulizer solution 3 mL (has no administration in time range)  albuterol  (PROVENTIL ) (2.5 MG/3ML) 0.083% nebulizer solution 7.5 mg (7.5 mg Nebulization Given 11/25/23 1959)   ED Course/ Medical Decision Making/ A&P   68 year old multiple medical comorbidities here for evaluation of shortness of breath.  History of COPD has known chronic hypoxic respiratory failure.  Worsening over the last few days.  Feels similar to her prior COPD exacerbation per patient.  Ready received steroids, mag, DuoNebs x 2 prior to arrival.  On arrival she still has  significant wheeze, tachypnea.  She does states she feels better and EMS states her breathing has improved.  Will plan on labs, imaging, will give 1 hour continuous neb and reassess  Labs and imaging personally viewed and interpreted:  CBC leukocytosis 11.8 Metabolic panel without significant abnormality COVID flu RSV negative BNP 84 Trop 9 EKG without ischemic changes  Patient reassessed.  Has completed hour-long DuoNeb.  Still has moderate wheeze.  Placed 4 L nasal cannula, hypoxic to 86% on her home 2 L [place].  Patient has had magnesium , IV steroids, 3 total breathing treatments occluding an hour-long nebulizer.  Noted further workup and management.  Suspect COPD exacerbation acute on chronic hypoxic respiratory failure.  CONSULT with IM teaching who is agreeable to evaluate patient for admission.  The patient appears reasonably stabilized for admission considering the current resources, flow, and capabilities available in the ED at this time, and I doubt any other [facility] requiring further screening and/or treatment in the ED prior to admission.                                  Medical Decision Making Amount and/or Complexity of Data Reviewed Independent Historian: EMS External Data Reviewed: labs, radiology, ECG and notes. Labs: ordered. Decision-making details documented in ED Course.  Radiology: ordered and independent interpretation performed. Decision-making details documented in ED Course. ECG/medicine tests: ordered and independent interpretation performed. Decision-making details documented in ED Course.  Risk OTC drugs. Prescription drug management. Parenteral controlled substances. Decision regarding hospitalization. Diagnosis or treatment significantly limited by social determinants of health.           Final Clinical Impression(s) / ED Diagnoses Final diagnoses:  Chronic obstructive pulmonary disease with acute exacerbation (HCC)  Acute on chronic respiratory  failure with hypoxia [facility])    Rx / DC Orders ED Discharge Orders     None         Cydnee Fuquay A, PA-C 11/25/23 2226    Karlyn Overman, MD 11/26/23 1158

## 2023-11-26 DIAGNOSIS — J441 Chronic obstructive pulmonary disease with (acute) exacerbation: Principal | ICD-10-CM

## 2023-11-26 LAB — CBC
HCT: 38.1 % (ref 36.0–46.0)
Hemoglobin: 12.3 g/dL (ref 12.0–15.0)
MCH: 31.5 pg (ref 26.0–34.0)
MCHC: 32.3 g/dL (ref 30.0–36.0)
MCV: 97.4 fL (ref 80.0–100.0)
Platelets: 402 10*3/uL — ABNORMAL HIGH (ref 150–400)
RBC: 3.91 MIL/uL (ref 3.87–5.11)
RDW: 14.7 % (ref 11.5–15.5)
WBC: 8.5 10*3/uL (ref 4.0–10.5)
nRBC: 0 % (ref 0.0–0.2)

## 2023-11-26 LAB — URINALYSIS, ROUTINE W REFLEX MICROSCOPIC
Bilirubin Urine: NEGATIVE
Glucose, UA: NEGATIVE mg/dL
Hgb urine dipstick: NEGATIVE
Ketones, ur: NEGATIVE mg/dL
Leukocytes,Ua: NEGATIVE
Nitrite: NEGATIVE
Protein, ur: NEGATIVE mg/dL
Specific Gravity, Urine: 1.005 (ref 1.005–1.030)
pH: 6 (ref 5.0–8.0)

## 2023-11-26 LAB — RESPIRATORY PANEL BY PCR

## 2023-11-26 LAB — BASIC METABOLIC PANEL WITH GFR
Anion gap: 12 (ref 5–15)
BUN: 7 mg/dL — ABNORMAL LOW (ref 8–23)
CO2: 25 mmol/L (ref 22–32)
Calcium: 9.3 mg/dL (ref 8.9–10.3)
Chloride: 103 mmol/L (ref 98–111)
Creatinine, Ser: 0.73 mg/dL (ref 0.44–1.00)
GFR, Estimated: >60 mL/min (ref >60)
Glucose, Bld: 164 mg/dL — ABNORMAL HIGH (ref 70–99)
Potassium: 3.8 mmol/L (ref 3.5–5.1)
Sodium: 140 mmol/L (ref 135–145)

## 2023-11-26 LAB — MAGNESIUM: Magnesium: 1.9 mg/dL (ref 1.7–2.4)

## 2023-11-26 LAB — TROPONIN I (HIGH SENSITIVITY): Troponin I (High Sensitivity): 5 ng/L (ref <18)

## 2023-11-26 MED ORDER — ASPIRIN 81 MG PO TBEC
81.0000 mg | DELAYED_RELEASE_TABLET | Freq: Every morning | ORAL | Status: DC
  Administered 2023-11-26 – 2023-11-27 (×2): 81 mg via ORAL
  Filled 2023-11-26 (×2): qty 1

## 2023-11-26 MED ORDER — IPRATROPIUM-ALBUTEROL 0.5-2.5 (3) MG/3ML IN SOLN
3.0000 mL | Freq: Four times a day (QID) | RESPIRATORY_TRACT | Status: DC | PRN
  Administered 2023-11-26: 3 mL via RESPIRATORY_TRACT
  Filled 2023-11-26: qty 3

## 2023-11-26 MED ORDER — ESCITALOPRAM OXALATE 10 MG PO TABS
20.0000 mg | ORAL_TABLET | Freq: Every day | ORAL | Status: DC
  Administered 2023-11-26: 20 mg via ORAL
  Filled 2023-11-26: qty 2

## 2023-11-26 MED ORDER — ESCITALOPRAM OXALATE 10 MG PO TABS: 20.0000 mg | ORAL_TABLET | Freq: Every day | ORAL | Status: DC

## 2023-11-26 MED ORDER — ASPIRIN 81 MG PO TBEC: 81.0000 mg | DELAYED_RELEASE_TABLET | Freq: Every morning | ORAL | Status: DC

## 2023-11-26 MED ORDER — METFORMIN HCL 500 MG PO TABS
500.0000 mg | ORAL_TABLET | Freq: Two times a day (BID) | ORAL | Status: DC
  Administered 2023-11-26 – 2023-11-27 (×3): 500 mg via ORAL
  Filled 2023-11-26 (×3): qty 1

## 2023-11-26 MED ORDER — NEBIVOLOL HCL 10 MG PO TABS: 10.0000 mg | ORAL_TABLET | Freq: Every day | ORAL | Status: DC

## 2023-11-26 MED ORDER — DOXEPIN HCL 25 MG PO CAPS
75.0000 mg | ORAL_CAPSULE | Freq: Every day | ORAL | Status: DC
  Administered 2023-11-26: 75 mg via ORAL
  Filled 2023-11-26: qty 3
  Filled 2023-11-26: qty 1
  Filled 2023-11-26: qty 3

## 2023-11-26 MED ORDER — BUDESON-GLYCOPYRROL-FORMOTEROL 160-9-4.8 MCG/ACT IN AERO: 2.0000 | INHALATION_SPRAY | Freq: Two times a day (BID) | RESPIRATORY_TRACT | Status: DC

## 2023-11-26 MED ORDER — AZITHROMYCIN 500 MG PO TABS
500.0000 mg | ORAL_TABLET | Freq: Every day | ORAL | Status: DC
  Administered 2023-11-26 – 2023-11-27 (×2): 500 mg via ORAL
  Filled 2023-11-26 (×2): qty 1

## 2023-11-26 MED ORDER — DOXEPIN HCL 75 MG PO CAPS: 75.0000 mg | ORAL_CAPSULE | Freq: Every day | ORAL | Status: DC

## 2023-11-26 MED ORDER — GUAIFENESIN ER 600 MG PO TB12
600.0000 mg | ORAL_TABLET | Freq: Two times a day (BID) | ORAL | Status: DC
  Administered 2023-11-26 – 2023-11-27 (×3): 600 mg via ORAL
  Filled 2023-11-26 (×3): qty 1

## 2023-11-26 MED ORDER — NEBIVOLOL HCL 10 MG PO TABS
10.0000 mg | ORAL_TABLET | Freq: Every day | ORAL | Status: DC
  Administered 2023-11-26: 10 mg via ORAL
  Filled 2023-11-26 (×2): qty 1

## 2023-11-26 MED ORDER — IPRATROPIUM-ALBUTEROL 0.5-2.5 (3) MG/3ML IN SOLN
3.0000 mL | RESPIRATORY_TRACT | Status: DC
  Administered 2023-11-26 – 2023-11-27 (×4): 3 mL via RESPIRATORY_TRACT
  Filled 2023-11-26 (×4): qty 3

## 2023-11-26 MED ORDER — CLONAZEPAM 0.5 MG PO TABS: 0.5000 mg | ORAL_TABLET | Freq: Two times a day (BID) | ORAL | Status: DC

## 2023-11-26 MED ORDER — PREDNISONE 20 MG PO TABS
40.0000 mg | ORAL_TABLET | Freq: Every day | ORAL | Status: DC
  Administered 2023-11-26 – 2023-11-27 (×2): 40 mg via ORAL
  Filled 2023-11-26 (×2): qty 2

## 2023-11-26 MED ORDER — TICAGRELOR 60 MG PO TABS
60.0000 mg | ORAL_TABLET | Freq: Two times a day (BID) | ORAL | Status: DC
Start: 2023-11-26 — End: 2023-11-27
  Administered 2023-11-26 – 2023-11-27 (×4): 60 mg via ORAL
  Filled 2023-11-26 (×5): qty 1

## 2023-11-26 MED ORDER — EZETIMIBE 10 MG PO TABS
10.0000 mg | ORAL_TABLET | Freq: Every day | ORAL | Status: DC
  Administered 2023-11-26: 10 mg via ORAL
  Filled 2023-11-26: qty 1

## 2023-11-26 MED ORDER — PANTOPRAZOLE SODIUM 40 MG PO TBEC: 80.0000 mg | DELAYED_RELEASE_TABLET | Freq: Every day | ORAL | Status: DC

## 2023-11-26 MED ORDER — GABAPENTIN 300 MG PO CAPS
600.0000 mg | ORAL_CAPSULE | Freq: Two times a day (BID) | ORAL | Status: DC
Start: 2023-11-26 — End: 2023-11-27
  Administered 2023-11-26 – 2023-11-27 (×3): 600 mg via ORAL
  Filled 2023-11-26 (×3): qty 2

## 2023-11-26 MED ORDER — MONTELUKAST SODIUM 10 MG PO TABS
10.0000 mg | ORAL_TABLET | Freq: Every day | ORAL | Status: DC
  Administered 2023-11-26: 10 mg via ORAL
  Filled 2023-11-26: qty 1

## 2023-11-26 MED ORDER — ENOXAPARIN SODIUM 40 MG/0.4ML IJ SOSY: 40.0000 mg | PREFILLED_SYRINGE | INTRAMUSCULAR | Status: DC

## 2023-11-26 MED ORDER — IPRATROPIUM-ALBUTEROL 0.5-2.5 (3) MG/3ML IN SOLN: 3.0000 mL | Freq: Four times a day (QID) | RESPIRATORY_TRACT | Status: DC

## 2023-11-26 MED ORDER — CLONAZEPAM 0.5 MG PO TABS
0.5000 mg | ORAL_TABLET | Freq: Two times a day (BID) | ORAL | Status: DC
  Administered 2023-11-26 – 2023-11-27 (×3): 0.5 mg via ORAL
  Filled 2023-11-26 (×3): qty 1

## 2023-11-26 MED ORDER — HYDROXYZINE HCL 25 MG PO TABS
50.0000 mg | ORAL_TABLET | Freq: Three times a day (TID) | ORAL | Status: DC | PRN
  Administered 2023-11-26: 50 mg via ORAL
  Filled 2023-11-26: qty 2

## 2023-11-26 MED ORDER — LEVOTHYROXINE SODIUM 112 MCG PO TABS
112.0000 ug | ORAL_TABLET | Freq: Every day | ORAL | Status: DC
  Administered 2023-11-26 – 2023-11-27 (×2): 112 ug via ORAL
  Filled 2023-11-26 (×2): qty 1

## 2023-11-26 MED ORDER — IPRATROPIUM-ALBUTEROL 0.5-2.5 (3) MG/3ML IN SOLN
3.0000 mL | RESPIRATORY_TRACT | Status: DC | PRN
  Administered 2023-11-26 (×3): 3 mL via RESPIRATORY_TRACT
  Filled 2023-11-26 (×3): qty 3

## 2023-11-26 MED ORDER — FUROSEMIDE 20 MG PO TABS: 20.0000 mg | ORAL_TABLET | Freq: Every day | ORAL | Status: DC

## 2023-11-26 MED ORDER — LOSARTAN POTASSIUM 50 MG PO TABS: 75.0000 mg | ORAL_TABLET | Freq: Every day | ORAL | Status: DC

## 2023-11-26 MED ORDER — AMLODIPINE BESYLATE 5 MG PO TABS: 5.0000 mg | ORAL_TABLET | Freq: Every day | ORAL | Status: DC

## 2023-11-26 MED ORDER — PANTOPRAZOLE SODIUM 40 MG PO TBEC: 40.0000 mg | DELAYED_RELEASE_TABLET | Freq: Two times a day (BID) | ORAL | Status: DC

## 2023-11-26 MED ORDER — IPRATROPIUM-ALBUTEROL 0.5-2.5 (3) MG/3ML IN SOLN
3.0000 mL | Freq: Three times a day (TID) | RESPIRATORY_TRACT | Status: DC
Start: 2023-11-26 — End: 2023-11-26

## 2023-11-26 MED ORDER — ATORVASTATIN CALCIUM 80 MG PO TABS
80.0000 mg | ORAL_TABLET | Freq: Every day | ORAL | Status: DC
  Administered 2023-11-26: 80 mg via ORAL
  Filled 2023-11-26: qty 1

## 2023-11-26 MED ORDER — AMLODIPINE BESYLATE 5 MG PO TABS
5.0000 mg | ORAL_TABLET | Freq: Every day | ORAL | Status: DC
  Administered 2023-11-26 – 2023-11-27 (×2): 5 mg via ORAL
  Filled 2023-11-26 (×2): qty 1

## 2023-11-26 MED ORDER — GABAPENTIN 300 MG PO CAPS: 600.0000 mg | ORAL_CAPSULE | Freq: Two times a day (BID) | ORAL | Status: DC

## 2023-11-26 MED ORDER — ENOXAPARIN SODIUM 40 MG/0.4ML IJ SOSY
40.0000 mg | PREFILLED_SYRINGE | Freq: Every day | INTRAMUSCULAR | Status: DC
  Administered 2023-11-26 – 2023-11-27 (×2): 40 mg via SUBCUTANEOUS
  Filled 2023-11-26 (×2): qty 0.4

## 2023-11-26 MED ORDER — LOSARTAN POTASSIUM 50 MG PO TABS
75.0000 mg | ORAL_TABLET | Freq: Every day | ORAL | Status: DC
Start: 2023-11-26 — End: 2023-11-27
  Administered 2023-11-26 – 2023-11-27 (×2): 75 mg via ORAL
  Filled 2023-11-26 (×2): qty 2

## 2023-11-26 MED ORDER — BUDESON-GLYCOPYRROL-FORMOTEROL 160-9-4.8 MCG/ACT IN AERO
2.0000 | INHALATION_SPRAY | Freq: Two times a day (BID) | RESPIRATORY_TRACT | Status: DC
  Administered 2023-11-26 – 2023-11-27 (×3): 2 via RESPIRATORY_TRACT
  Filled 2023-11-26: qty 5.9

## 2023-11-26 MED ORDER — MIRTAZAPINE 15 MG PO TABS
45.0000 mg | ORAL_TABLET | Freq: Every day | ORAL | Status: DC
  Administered 2023-11-26: 45 mg via ORAL
  Filled 2023-11-26: qty 3

## 2023-11-26 MED ORDER — FUROSEMIDE 20 MG PO TABS
20.0000 mg | ORAL_TABLET | Freq: Every day | ORAL | Status: DC
  Administered 2023-11-26 – 2023-11-27 (×2): 20 mg via ORAL
  Filled 2023-11-26 (×2): qty 1

## 2023-11-26 MED ORDER — ACETAMINOPHEN 500 MG PO TABS: 1000.0000 mg | ORAL_TABLET | Freq: Three times a day (TID) | ORAL | Status: DC | PRN

## 2023-11-26 MED ORDER — IPRATROPIUM-ALBUTEROL 0.5-2.5 (3) MG/3ML IN SOLN: 3.0000 mL | Freq: Four times a day (QID) | RESPIRATORY_TRACT | Status: DC | PRN

## 2023-11-26 MED ORDER — PANTOPRAZOLE SODIUM 40 MG PO TBEC
40.0000 mg | DELAYED_RELEASE_TABLET | Freq: Two times a day (BID) | ORAL | Status: DC
  Administered 2023-11-26 – 2023-11-27 (×3): 40 mg via ORAL
  Filled 2023-11-26 (×3): qty 1

## 2023-11-26 NOTE — Progress Notes (Signed)
 HD#0 SUBJECTIVE:  Patient Summary: [name] [name] is a 68 y.o. person living with a history of COPD, Chronic hypoxic respiratory failure on 2-3L supplemental oxygen  at home, anxiety, hypothyroidism, HFpEF LVEF 60-65%  who presented to the emergency department for shortness of breath and admitted for COPD exacerbation.   Overnight Events and Interim History: Admitted overnight. Reports some dyspnea, anxiety, malaise this morning.  OBJECTIVE:  Vital Signs: Vitals:   11/26/23 0230 11/26/23 0300 11/26/23 0422 11/26/23 0741  BP: 132/66 (!) 126/58 116/65 (!) 153/77  Pulse: 80 79 79 86  Resp: 12 16 18 18   Temp:   98.4 F (36.9 C) 98.3 F (36.8 C)  TempSrc:   Oral Oral  SpO2: 99% 94% 97% 99%  Weight:      Height:       Supplemental O2: Nasal Cannula SpO2: 99 % O2 Flow Rate (L/min): 2 L/min  Filed Weights   11/25/23 1946  Weight: 69 kg    No intake or output data in the 24 hours ending 11/26/23 1021 Net IO Since Admission: No IO data has been entered for this period [11/26/23 1021]  Physical Exam: Physical Exam Constitutional:      General: She is not in acute distress.    Appearance: She is not ill-appearing.  Cardiovascular:     Rate and Rhythm: Normal rate and regular rhythm.     Pulses: Normal pulses.  Pulmonary:     Effort: Pulmonary effort is normal. No tachypnea, accessory muscle usage or respiratory distress.     Breath sounds: Wheezing present.     Comments: Diffuse wheezes and referred upper airway sounds Abdominal:     Palpations: Abdomen is soft.     Tenderness: There is no abdominal tenderness.  Skin:    General: Skin is warm and dry.  Neurological:     General: No focal deficit present.     Mental Status: She is alert and oriented to person, place, and time.  Psychiatric:        Mood and Affect: Mood is anxious.        Behavior: Behavior normal.     Patient Lines/Drains/Airways Status     Active Line/Drains/Airways     Name Placement date  Placement time Site Days   Peripheral IV 11/25/23 18 G Left Forearm 11/25/23  1948  Forearm  1             ASSESSMENT/PLAN:  Assessment: Principal Problem:   COPD exacerbation (HCC)  [name] [name] is a 68 y.o. person living with a history of COPD, Chronic hypoxic respiratory failure on 2-3L supplemental oxygen  at home, anxiety, hypothyroidism, HFpEF LVEF 60-65%  who presented to the emergency department for shortness of breath and admitted for COPD exacerbation.   Plan: COPD exacerbation Chronic hypoxic respiratory failure Stable this morning.  She has returned to her home oxygen  level.  She does still report dyspnea and discomfort, noting symptoms typical for previous COPD exacerbations.  Good to see that she is on home oxygen  level.  Lungs still wheezy.  Will keep today.  Scheduled DuoNebs, start azithromycin , ambulate.  Viral panel negative, does not appear to have CAP, might have missed inhaler doses. -Breztri 2 puffs twice daily ordered as an alternative to her home Trelegy 1 puff daily -DuoNebs every 6 hours scheduled -Prednisone  40mg  day 1/4, s/p solumedrol at admission -Azithromycin  day 1/3 -PT/OT, ambulation   Suprapubic abdominal Pain Patient reports dysuria and has suprapubic tenderness on exam.  Will send UA  for UTI.   HFpEF, LVEF 60 to 65% Euvolemic, continue her home Lasix  20 mg daily, losartan  75 mg daily, and Bystolic  10 mg nightly - Bystolic  10 mg held at admission, diastolic blood pressure being lower than 60, will restart when able    Generalized anxiety disorder Insomnia  Resume home regimen including Klonopin  0.5 twice a day, Remeron  45 mg nightly, BuSpar  60 mg nightly, doxepin  75 mg nightly, escitalopram  20 mg in the morning.  Hypothyroidism Continue home levothyroxine  112 mcg Prediabetes  Continue metformin  500 mg BID CAD s/p LAD stent with evidence of rethrombosis Continue aspirin  81 mg daily, Brilinta  60 mg twice daily, lipitor  80mg , zetia  10 mg   Tobacco use 21 mg nicotine  patch ordered  Best Practice: Diet: Regular diet IVF: None VTE: enoxaparin  (LOVENOX ) injection 40 mg Start: 11/26/23 1000 Code: DNR/DNI AB: Azithromycin  for COPD Pain Medicine: n/a Bowel Regimen: n/a Therapy Recs: Pending DISPO: Anticipated discharge tomorrow to Home pending medical stability.  Signature: Carleen Chary, D.O.  Internal Medicine Resident, PGY-1 [facility]  Pager: # [phone]. 10:21 AM, 11/26/2023

## 2023-11-26 NOTE — Evaluation (Signed)
 Physical Therapy Evaluation and Discharge Patient Details Name: [name] [name] MRN: [id number] DOB: [birth year] Today's Date: 11/26/2023  History of Present Illness  Pt is a 68 yo presenting to [facility] on 11/25/2023 with shortness of breath and admitted for COPD exacerbation. PMH: HTN, CAD with multiple DES, DM2, COPD, anxiety, hypothyroidism, RTC repair 12/24/2021, ongoing tobacco abuse.  Clinical Impression  Patient evaluated by Physical Therapy with no further acute PT needs identified. Pt overall with decreased cardiopulmonary endurance but is mobilizing fairly. Pt ambulating 120 ft with no assistive device and requiring one brief standing rest break with cues for pursed lip breathing. SpO2 90-94% on 2L O2 (pt typically wears 2L O2 at home). Education provided regarding energy conservation techniques. All education has been completed and the patient has no further questions. No follow-up Physical Therapy or equipment needs. PT is signing off. Thank you for this referral.              Equipment Recommendations None recommended by PT  Recommendations for Other Services       Functional Status Assessment Patient has not had a recent decline in their functional status     Precautions / Restrictions Precautions Precautions: None Restrictions Weight Bearing Restrictions Per Provider Order: No      Mobility  Bed Mobility Overal bed mobility: Modified Independent                  Transfers Overall transfer level: Independent Equipment used: None                    Ambulation/Gait Ambulation/Gait assistance: Modified independent (Device/Increase time) Gait Distance (Feet): 120 Feet (60", 60") Assistive device: None Gait Pattern/deviations: Step-through pattern, Decreased stride length       General Gait Details: Pt requiring one standing rest break  Stairs            Wheelchair Mobility     Tilt Bed    Modified Rankin (Stroke Patients Only)        Balance Overall balance assessment: Mild deficits observed, not formally tested                                           Pertinent Vitals/Pain Pain Assessment Pain Assessment: No/denies pain    Home Living Family/patient expects to be discharged to:: Private residence Living Arrangements: Alone Available Help at Discharge: Family;Available PRN/intermittently Type of Home: Apartment Home Access: Stairs to enter Entrance Stairs-Rails: Left Entrance Stairs-Number of Steps: 2   Home Layout: One level Home Equipment: Agricultural consultant (2 wheels);Shower seat;Hand held shower head;Grab bars - tub/shower;Rollator (4 wheels)      Prior Function Prior Level of Function : Independent/Modified Independent             Mobility Comments: uses rollator ADLs Comments: does not drive. Mod I with ADLs. Daughter takes her to store and she rides bus to doctor appts.     Extremity/Trunk Assessment   Upper Extremity Assessment Upper Extremity Assessment: Overall WFL for tasks assessed    Lower Extremity Assessment Lower Extremity Assessment: Overall WFL for tasks assessed       Communication   Communication Communication: No apparent difficulties    Cognition Arousal: Alert Behavior During Therapy: WFL for tasks assessed/performed   PT - Cognitive impairments: No apparent impairments  Following commands: Intact       Cueing Cueing Techniques: Verbal cues     General Comments      Exercises     Assessment/Plan    PT Assessment Patient does not need any further PT services  PT Problem List         PT Treatment Interventions      PT Goals (Current goals can be found in the Care Plan section)  Acute Rehab PT Goals Patient Stated Goal: to breathe easier PT Goal Formulation: All assessment and education complete, DC therapy    Frequency       Co-evaluation               AM-PAC PT "6 Clicks" Mobility   Outcome Measure Help needed turning from your back to your side while in a flat bed without using bedrails?: None Help needed moving from lying on your back to sitting on the side of a flat bed without using bedrails?: None Help needed moving to and from a bed to a chair (including a wheelchair)?: None Help needed standing up from a chair using your arms (e.g., wheelchair or bedside chair)?: None Help needed to walk in hospital room?: None Help needed climbing 3-5 steps with a railing? : A Little 6 Click Score: 23    End of Session Equipment Utilized During Treatment: Oxygen  Activity Tolerance: Patient tolerated treatment well Patient left: in bed;with call bell/phone within reach Nurse Communication: Other (comment) (pt complaining of itchiness at IV site) PT Visit Diagnosis: Difficulty in walking, not elsewhere classified (R26.2)    Time: 2130-8657 PT Time Calculation (min) (ACUTE ONLY): 22 min   Charges:   PT Evaluation $PT Eval Low Complexity: 1 Low   PT General Charges $$ ACUTE PT VISIT: 1 Visit         Verdia Glad, PT, DPT Acute Rehabilitation Services Office [phone]   Claria Crofts 11/26/2023, 4:24 PM

## 2023-11-26 NOTE — Hospital Course (Signed)
 COPD exacerbation Chronic hypoxic respiratory failure Presented with SOB, respiratory distress progressive over 24 hours. Treated with duonebs, steroids, inhalers, azithromycin . At first required 4L supplemental oxygen , returned to baseline 2L after her first night.  -Breztri 2 puffs twice daily ordered as an alternative to her home Trelegy 1 puff daily -DuoNebs every 6 hours scheduled -Prednisone  40mg  day 1/4, s/p solumedrol at admission -Azithromycin  day 1/3 -PT/OT, ambulation    Stable this morning.  She has returned to her home oxygen  level.  She does still report dyspnea and discomfort, noting symptoms typical for previous COPD exacerbations.  Good to see that she is on home oxygen  level.  Lungs still wheezy.  Will keep today.  Scheduled DuoNebs, start azithromycin , ambulate.  Viral panel negative, does not appear to have CAP, might have missed inhaler doses.    Suprapubic abdominal Pain Patient reports dysuria and has suprapubic tenderness on exam.  Will send UA for UTI.   HFpEF, LVEF 60 to 65% Euvolemic, continue her home Lasix  20 mg daily, losartan  75 mg daily, and Bystolic  10 mg nightly - Bystolic  10 mg held at admission, diastolic blood pressure being lower than 60, will restart when able     Generalized anxiety disorder Insomnia  Resume home regimen including Klonopin  0.5 twice a day, Remeron  45 mg nightly, BuSpar  60 mg nightly, doxepin  75 mg nightly, escitalopram  20 mg in the morning.  Hypothyroidism Continue home levothyroxine  112 mcg Prediabetes  Continue metformin  500 mg BID CAD s/p LAD stent with evidence of rethrombosis Continue aspirin  81 mg daily, Brilinta  60 mg twice daily, lipitor  80mg , zetia  10 mg  Tobacco use 21 mg nicotine  patch ordered

## 2023-11-26 NOTE — Plan of Care (Signed)
 Patient AAOx4. NC2L in place. PRN breathing treatments given with + effect. OOB to commode. Patient walked out of room w/o oxygen  multiple times, educated to not remove oxygen . Safety precautions  maintained.    Problem: Health Behavior/Discharge Planning: Goal: Ability to manage health-related needs will improve Outcome: Progressing   Problem: Clinical Measurements: Goal: Respiratory complications will improve Outcome: Progressing   Problem: Activity: Goal: Risk for activity intolerance will decrease Outcome: Progressing   Problem: Nutrition: Goal: Adequate nutrition will be maintained Outcome: Progressing   Problem: Coping: Goal: Level of anxiety will decrease Outcome: Progressing

## 2023-11-26 NOTE — Care Management Obs Status (Cosign Needed)
 MEDICARE OBSERVATION STATUS NOTIFICATION   Patient Details  Name: [name] [name] MRN: [id number] Date of Birth: [birth year]   Medicare Observation Status Notification Given:  Yes    Dane Dung, RN 11/26/2023, 11:09 AM

## 2023-11-26 NOTE — ED Notes (Signed)
 Per Nurse Tech, pt took off O2 on transport and refused to reapply O2 even after being educated.

## 2023-11-27 ENCOUNTER — Encounter: Payer: Self-pay | Admitting: Hematology

## 2023-11-27 ENCOUNTER — Other Ambulatory Visit (HOSPITAL_COMMUNITY): Payer: Self-pay

## 2023-11-27 DIAGNOSIS — J441 Chronic obstructive pulmonary disease with (acute) exacerbation: Secondary | ICD-10-CM | POA: Diagnosis not present

## 2023-11-27 MED ORDER — PREDNISONE 20 MG PO TABS
40.0000 mg | ORAL_TABLET | Freq: Every day | ORAL | 0 refills | Status: DC
  Filled 2023-11-27: qty 4, 2d supply, fill #0

## 2023-11-27 MED ORDER — IPRATROPIUM-ALBUTEROL 0.5-2.5 (3) MG/3ML IN SOLN
3.0000 mL | Freq: Three times a day (TID) | RESPIRATORY_TRACT | 1 refills | Status: DC
  Filled 2023-11-27: qty 90, 10d supply, fill #0

## 2023-11-27 MED ORDER — GUAIFENESIN ER 600 MG PO TB12
600.0000 mg | ORAL_TABLET | Freq: Two times a day (BID) | ORAL | 0 refills | Status: DC | PRN
  Filled 2023-11-27: qty 7, 4d supply, fill #0

## 2023-11-27 MED ORDER — AZITHROMYCIN 500 MG PO TABS
500.0000 mg | ORAL_TABLET | Freq: Every day | ORAL | 0 refills | Status: DC
  Filled 2023-11-27: qty 1, 1d supply, fill #0

## 2023-11-27 NOTE — Progress Notes (Signed)
SATURATION QUALIFICATIONS: (This note is used to comply with regulatory documentation for home oxygen)  Patient Saturations on Room Air at Rest = 94%  Patient Saturations on Room Air while Ambulating = 87%  Patient Saturations on 3 Liters of oxygen while Ambulating = 92%

## 2023-11-27 NOTE — Discharge Summary (Addendum)
 Name: [name] [name] MRN: [id number] DOB: Jun 14, 1956 68 y.o. PCP: Lawrance Presume, MD  Date of Admission: 11/25/2023  7:45 PM Date of Discharge:  11/27/2023 Attending Physician: Dr. Bettejane Brownie  DISCHARGE DIAGNOSIS:  Primary Problem: COPD exacerbation [name])   Hospital Problems: Principal Problem:   COPD exacerbation (HCC)   DISCHARGE MEDICATIONS:   Allergies as of 11/27/2023       Reactions   Avocado Anaphylaxis   Latex Shortness Of Breath, Rash   Codeine  Nausea Only        Medication List     TAKE these medications    Accu-Chek Guide Test test strip Generic drug: glucose blood Use as instructed to check blood sugars once a day   Accu-Chek Guide w/Device Kit Use to check blood sugars once a day   Accu-Chek Softclix Lancets lancets Use to check blood sugars once a day   acetaminophen  500 MG tablet Commonly known as: TYLENOL  Take 2 tablets (1,000 mg total) by mouth every 6 (six) hours. What changed: when to take this   alendronate  70 MG tablet Commonly known as: FOSAMAX  Take 1 tablet (70 mg total) by mouth every 7 (seven) days. Take with a full glass of water on an empty stomach.   amLODipine  5 MG tablet Commonly known as: NORVASC  Take 1 tablet (5 mg total) by mouth daily. What changed: when to take this   aspirin  EC 81 MG tablet Take 81 mg by mouth in the morning.   atorvastatin  80 MG tablet Commonly known as: LIPITOR  Take 1 tablet (80 mg total) by mouth daily. What changed: when to take this   azithromycin  500 MG tablet Commonly known as: ZITHROMAX  Take 1 tablet (500 mg total) by mouth daily for 1 day. Start taking on: Nov 28, 2023   Brilinta  60 MG Tabs tablet Generic drug: ticagrelor  Take 1 tablet (60 mg total) by mouth 2 (two) times daily.   busPIRone  15 MG tablet Commonly known as: BUSPAR  Take 2 tablets (30 mg total) by mouth in the morning AND 1 tablet (15 mg total) daily at 12 noon AND 1 tablet (15 mg total) every evening.    clonazePAM  0.5 MG tablet Commonly known as: KlonoPIN  Take 1 tablet (0.5 mg total) by mouth 3 (three) times daily as needed for anxiety. What changed: when to take this   docusate sodium  100 MG capsule Commonly known as: COLACE Take 1 capsule (100 mg total) by mouth 2 (two) times daily as needed for mild constipation.   doxepin  75 MG capsule Commonly known as: SINEQUAN  Take 1 capsule (75 mg total) by mouth at bedtime.   escitalopram  20 MG tablet Commonly known as: LEXAPRO  Take 1 tablet (20 mg total) by mouth daily.   ezetimibe  10 MG tablet Commonly known as: ZETIA  Take 1 tablet (10 mg total) by mouth daily. What changed: when to take this   furosemide  20 MG tablet Commonly known as: LASIX  Take 1 tablet (20 mg total) by mouth daily. What changed: when to take this   gabapentin  600 MG tablet Commonly known as: NEURONTIN  TAKE 1 TABLET BY MOUTH 4 TIMES DAILY What changed:  how much to take when to take this   guaiFENesin  600 MG 12 hr tablet Commonly known as: MUCINEX  Take 1 tablet (600 mg total) by mouth 2 (two) times daily as needed for up to 8 doses. Take for cough/phlegm.   ipratropium-albuterol  0.5-2.5 (3) MG/3ML Soln Commonly known as: DUONEB Take 3 mLs by nebulization every 8 (eight)  hours.   levothyroxine  112 MCG tablet Commonly known as: SYNTHROID  Take 1 tablet (112 mcg total) by mouth daily. What changed: when to take this   lidocaine  5 % Commonly known as: LIDODERM  Place 1 patch onto the skin daily. Remove & Discard patch within 12 hours or as directed by MD   losartan  50 MG tablet Commonly known as: COZAAR  Take 1.5 tablets (75 mg total) by mouth daily.   metFORMIN  500 MG tablet Commonly known as: GLUCOPHAGE  Take 1 tablet (500 mg total) by mouth 2 (two) times daily with a meal.   mirtazapine  45 MG tablet Commonly known as: REMERON  Take 1 tablet (45 mg total) by mouth at bedtime.   montelukast  10 MG tablet Commonly known as: SINGULAIR  Take 1  tablet (10 mg total) by mouth at bedtime.   nebivolol  10 MG tablet Commonly known as: Bystolic  Take 1 tablet (10 mg total) by mouth daily. What changed: when to take this   nicotine  14 mg/24hr patch Commonly known as: NICODERM CQ  - dosed in mg/24 hours Place 1 patch (14 mg total) onto the skin daily.   nitroGLYCERIN  0.4 MG SL tablet Commonly known as: NITROSTAT  Place 1 tablet (0.4 mg total) under the tongue every 5 (five) minutes as needed for chest pain.   omeprazole  40 MG capsule Commonly known as: PRILOSEC Take 1 capsule (40 mg total) by mouth 2 (two) times daily before a meal.   ondansetron  4 MG disintegrating tablet Commonly known as: ZOFRAN -ODT Take 1 tablet (4 mg total) by mouth every 8 (eight) hours as needed for nausea or vomiting.   polyethylene glycol 17 g packet Commonly known as: MIRALAX  / GLYCOLAX  Take 17 g by mouth daily as needed for severe constipation.   predniSONE  20 MG tablet Commonly known as: DELTASONE  Take 2 tablets (40 mg total) by mouth daily with breakfast. Start taking on: Nov 28, 2023   Spacer/Aero-Holding Ismael Maria Use with the albuterol  inhaler.   Trelegy Ellipta  200-62.5-25 MCG/ACT Aepb Generic drug: Fluticasone -Umeclidin-Vilant Inhale 1 puff into the lungs daily. What changed: when to take this   Ventolin  HFA 108 (90 Base) MCG/ACT inhaler Generic drug: albuterol  INHALE 2 PUFFS into THE lungs EVERY 6 HOURS AS NEEDED wheezing AND SHORTNESS OF BREATH        DISPOSITION AND FOLLOW-UP:  [name] was discharged from [facility] in fair condition. At the hospital follow up visit please address:  COPD exacerbation: Back on 2 L nasal cannula at discharge.  Ensure she completes prednisone  (2 more days) and azithromycin  (1 more day).  She will need outpatient follow-up with her pulmonologist.  Follow-up on respiratory symptoms and ensure she has refills of her COPD medications.  Follow-up Appointments:  Follow-up  Information     Lawrance Presume, MD. Schedule an appointment as soon as possible for a visit.   Specialty: Internal Medicine Contact information: [address] [place] [address] [phone]                HOSPITAL COURSE:  Patient Summary: COPD exacerbation Chronic hypoxic respiratory failure Presented with SOB, respiratory distress progressive over 24 hours. Treated with duonebs, steroids, inhalers, azithromycin .  Viral panel was negative, suspicion for infectious etiology of exacerbation.  She may have missed some of her COPD inhaler doses, which likely contributed to her worsening shortness of breath.  On presentation, she required 4L supplemental oxygen , returned to baseline 2L after her first night.  By the second day of  hospitalization, she was able to ambulate with the physical therapist on 2 L with an oxygen  saturation greater than 90%.  She remained wheezy, but this is her baseline.  She states that she feels much better and is ready for discharge.  With return to her regular baseline home oxygen  and ability to tolerate any ambulation without desaturation, she was deemed medically stable for discharge with continued outpatient treatment.   Suprapubic abdominal Pain She reported dysuria and had mild suprapubic tenderness on exam.  A urinalysis was sent, which did not show any evidence of urinary tract infection.  She can follow-up outpatient if this pain persists.   Chronic HFpEF, LVEF 60 to 65% She has remained euvolemic throughout her hospital stay with no concerns of acute exacerbation.  She was continued on her home Lasix  20 mg daily and losartan  75 mg daily. Bystolic  was initially held on admission due to low diastolic blood pressure, but can be restarted outpatient.   Generalized anxiety disorder Insomnia  No new changes during her hospitalization.  Stable on home regimen including Klonopin  0.5 twice a day, Remeron  45 mg nightly, BuSpar  60 mg nightly,  doxepin  75 mg nightly, escitalopram  20 mg in the morning.   Hypothyroidism No new changes during her hospitalization.  Stable on home levothyroxine  112 mcg.  Prediabetes  No new changes during her hospitalization.  Continue on metformin  500 mg BID.  CAD s/p LAD stent with evidence of rethrombosis No new changes during her hospitalization.  Continued home aspirin  81 mg daily, Brilinta  60 mg twice daily, lipitor  80mg , zetia  10 mg .   DISCHARGE INSTRUCTIONS:   Discharge Instructions     Diet general   Complete by: As directed    Discharge instructions   Complete by: As directed    Ms [name] [name], you were hospitalized with a COPD exacerbation. It is good to see that you have improved. Please plan on one more dose of azithromycin  to take tomorrow and two more doses of prednisone , two pills tomorrow and Saturday. I will refill your duonebs as well. Otherwise return to taking your regular medicines. Please reach out to your pulmonologist for a hospital follow up.   Increase activity slowly   Complete by: As directed        SUBJECTIVE:   Doing well this morning.  Sitting in bed on room air no respiratory distress.  Says she feels like she is ready to go home.  Able to ambulate without desaturation on 2 L.  Still has some wheezing and coughing, but says this is normal for her.  Discharge Vitals:   BP 129/73 (BP Location: Left Arm)   Pulse 83   Temp 98.3 F (36.8 C) (Oral)   Resp 18   Ht 4\' 9"  (1.448 m)   Wt 69 kg   SpO2 97%   BMI 32.92 kg/m   OBJECTIVE:  Physical Exam Constitutional:      General: She is not in acute distress.    Appearance: She is not ill-appearing.  Cardiovascular:     Rate and Rhythm: Normal rate and regular rhythm.     Pulses: Normal pulses.  Pulmonary:     Effort: Pulmonary effort is normal. No tachypnea or respiratory distress.     Breath sounds: No stridor. Wheezing present.     Comments: Mild diffuse expiratory wheezes very improved from  yesterday Musculoskeletal:     Right lower leg: No edema.     Left lower leg: No edema.  Skin:  General: Skin is warm and dry.     Capillary Refill: Capillary refill takes less than 2 seconds.  Neurological:     General: No focal deficit present.     Mental Status: She is alert and oriented to person, place, and time.  Psychiatric:        Mood and Affect: Mood normal.        Behavior: Behavior normal.     Pertinent Labs, Studies, and Procedures:     Latest Ref Rng & Units 11/26/2023    6:24 AM 11/25/2023   11:18 PM 11/25/2023    7:47 PM  CBC  WBC 4.0 - 10.5 K/uL 8.5   11.8   Hemoglobin 12.0 - 15.0 g/dL 30.8  65.7  84.6   Hematocrit 36.0 - 46.0 % 38.1  37.0  39.7   Platelets 150 - 400 K/uL 402   397        Latest Ref Rng & Units 11/26/2023    6:24 AM 11/25/2023   11:18 PM 11/25/2023    7:47 PM  CMP  Glucose 70 - 99 mg/dL 962   952   BUN 8 - 23 mg/dL 7   6   Creatinine 8.41 - 1.00 mg/dL 3.24   4.01   Sodium 027 - 145 mmol/L 140  142  144   Potassium 3.5 - 5.1 mmol/L 3.8  4.5  3.7   Chloride 98 - 111 mmol/L 103   105   CO2 22 - 32 mmol/L 25   30   Calcium  8.9 - 10.3 mg/dL 9.3   9.3     DG Chest Portable 1 View Result Date: 11/25/2023 CLINICAL DATA:  Shortness of breath. EXAM: PORTABLE CHEST 1 VIEW COMPARISON:  November 11, 2023 FINDINGS: The heart size and mediastinal contours are within normal limits. There is marked severity calcification of the aortic arch. Low lung volumes are noted. Mild to moderate severity areas of scarring and/or atelectasis are seen within the bilateral lung bases. There is a small left pleural effusion. No pneumothorax is identified. There is a chronic fracture of the mid left clavicle. IMPRESSION: 1. Low lung volumes with mild to moderate severity bibasilar scarring and/or atelectasis. 2. Small left pleural effusion. Electronically Signed   By: Virgle Grime M.D.   On: 11/25/2023 20:41    Signed: Carleen Chary, DO Internal Medicine Resident,  PGY-1 [facility]  1:18 PM, 11/27/2023

## 2023-11-27 NOTE — Plan of Care (Signed)
 Patient AAOx4. NC2L in place. No complaints of pain. Safety precautions  maintained.  Problem: Health Behavior/Discharge Planning: Goal: Ability to manage health-related needs will improve Outcome: Progressing   Problem: Activity: Goal: Risk for activity intolerance will decrease Outcome: Progressing   Problem: Nutrition: Goal: Adequate nutrition will be maintained Outcome: Progressing   Problem: Coping: Goal: Level of anxiety will decrease Outcome: Progressing   Problem: Safety: Goal: Ability to remain free from injury will improve Outcome: Progressing

## 2023-11-27 NOTE — Plan of Care (Signed)

## 2023-11-28 NOTE — Progress Notes (Signed)
 DISCHARGE NOTE HOME [name] [name] to be discharged Home per MD order. Discussed prescriptions and follow up appointments with the patient. Prescriptions given to patient; medication list explained in detail. Patient verbalized understanding.  Skin clean, dry and intact without evidence of skin break down, no evidence of skin tears noted. IV catheter discontinued intact. Site without signs and symptoms of complications. Dressing and pressure applied. Pt denies pain at the site currently. No complaints noted.  Patient free of lines, drains, and wounds.   An After Visit Summary (AVS) was printed and given to the patient.  Taken to [facility] to await ride Patient will be escorted via wheelchair, and discharged home via private auto.  Tonda Francisco, RN

## 2023-12-01 ENCOUNTER — Other Ambulatory Visit: Payer: Self-pay | Admitting: Internal Medicine

## 2023-12-01 ENCOUNTER — Other Ambulatory Visit: Payer: Self-pay

## 2023-12-01 ENCOUNTER — Other Ambulatory Visit (HOSPITAL_COMMUNITY): Payer: Self-pay

## 2023-12-01 ENCOUNTER — Telehealth: Payer: Self-pay

## 2023-12-01 NOTE — Telephone Encounter (Signed)
 From [facility] call:  Call completed with patient's daughter,Tiffany.  She said her mother is doing better but she had some swelling in her legs that had gotten worse and her weight increased by 8 lbs in 1 week so she gave her mother an extra 20 mg of furosemide  today. She said she has done this in the past

## 2023-12-01 NOTE — Transitions of Care (Post Inpatient/ED Visit) (Signed)
   12/01/2023  Name: [name] [name] MRN: [id number] DOB: 11/14/1955  Today's TOC FU Call Status: Today's TOC FU Call Status:: Successful TOC FU Call Completed TOC FU Call Complete Date: 12/01/23 Patient's Name and Date of Birth confirmed.  Transition Care Management Follow-up Telephone Call Date of Discharge: 11/27/23 Discharge Facility: [facility]) Type of Discharge: Inpatient Admission Primary Inpatient Discharge Diagnosis:: COPD exacerbation How have you been since you were released from the hospital?: Better (Tiffany said she is doing better but she had some swelling in her legs that had gotten worse and her weight increased by 8 lbs in 1 week so she gave her mother an extra 20 mg of furosemide  today.  She said she has done this in the past.) Any questions or concerns?: Yes Patient Questions/Concerns:: noted above Patient Questions/Concerns Addressed: Notified Provider of Patient Questions/Concerns (I told Tiffany that I would share her concerns with Dr Lincoln Renshaw)  Items Reviewed: Did you receive and understand the discharge instructions provided?: Yes Medications obtained,verified, and reconciled?: No Medications Not Reviewed Reasons:: Other: (Tiffany said she has all of her mother's medications and she did not have any questions about the med regime and did not need to review the med list.  Tiffany said she sets up the medications for her mother to take) Any new allergies since your discharge?: No Dietary orders reviewed?: Yes Type of Diet Ordered:: heart healthy, low sodium Do you have support at home?: Yes People in Home [RPT]: alone Name of Support/Comfort Primary Source: her daughter calls to check on her daily if she doesnt go to see her. She also has a Ring doorbell to check on her mother  Medications Reviewed Today: Medications Reviewed Today   Medications were not reviewed in this encounter     Home Care and Equipment/Supplies: Were Home Health Services Ordered?:  No Any new equipment or medical supplies ordered?: No  Functional Questionnaire: Do you need assistance with bathing/showering or dressing?: No (Tiffany said she will assist her with showers as needed. She uses O2 @ 2L continuously.  I told her that her mother may be  eligible for PCS and she said she will think about it.) Do you need assistance with meal preparation?: No Do you need assistance with eating?: No Do you have difficulty maintaining continence: No Do you need assistance with getting out of bed/getting out of a chair/moving?: No Do you have difficulty managing or taking your medications?: Yes (Tiffany oversees the med regime.  She has a glucometer and checks her own blood sugars occasionally.  Tiffany said she has focused on her mother weighing herself every morning.)  Follow up appointments reviewed: PCP Follow-up appointment confirmed?: Yes Date of PCP follow-up appointment?: 12/11/23 Follow-up Provider: [facility] Follow-up appointment confirmed?: Yes Date of Specialist follow-up appointment?: 12/09/23 Follow-Up Specialty Provider:: pulmonary Do you need transportation to your follow-up appointment?: No Do you understand care options if your condition(s) worsen?: Yes-patient verbalized understanding    SIGNATURE Burnett Carson, RN

## 2023-12-02 ENCOUNTER — Other Ambulatory Visit (HOSPITAL_COMMUNITY): Payer: Self-pay

## 2023-12-02 ENCOUNTER — Other Ambulatory Visit: Payer: Self-pay

## 2023-12-03 ENCOUNTER — Encounter: Payer: Self-pay | Admitting: Internal Medicine

## 2023-12-04 ENCOUNTER — Encounter: Payer: Self-pay | Admitting: Internal Medicine

## 2023-12-04 ENCOUNTER — Other Ambulatory Visit: Payer: Self-pay

## 2023-12-04 ENCOUNTER — Encounter (HOSPITAL_COMMUNITY): Payer: Self-pay

## 2023-12-04 ENCOUNTER — Emergency Department (HOSPITAL_COMMUNITY)

## 2023-12-04 ENCOUNTER — Observation Stay (HOSPITAL_COMMUNITY)
Admission: EM | Admit: 2023-12-04 | Discharge: 2023-12-05 | Disposition: A | Discharge status: Home / Self Care | Attending: Internal Medicine | Admitting: Internal Medicine

## 2023-12-04 DIAGNOSIS — R0689 Other abnormalities of breathing: Secondary | ICD-10-CM | POA: Diagnosis not present

## 2023-12-04 DIAGNOSIS — G4700 Insomnia, unspecified: Secondary | ICD-10-CM | POA: Insufficient documentation

## 2023-12-04 DIAGNOSIS — Z9189 Other specified personal risk factors, not elsewhere classified: Secondary | ICD-10-CM

## 2023-12-04 DIAGNOSIS — Z7984 Long term (current) use of oral hypoglycemic drugs: Secondary | ICD-10-CM | POA: Diagnosis not present

## 2023-12-04 DIAGNOSIS — I2510 Atherosclerotic heart disease of native coronary artery without angina pectoris: Secondary | ICD-10-CM

## 2023-12-04 DIAGNOSIS — Z91040 Latex allergy status: Secondary | ICD-10-CM | POA: Diagnosis not present

## 2023-12-04 DIAGNOSIS — F17210 Nicotine dependence, cigarettes, uncomplicated: Secondary | ICD-10-CM | POA: Insufficient documentation

## 2023-12-04 DIAGNOSIS — Z1152 Encounter for screening for COVID-19: Secondary | ICD-10-CM | POA: Insufficient documentation

## 2023-12-04 DIAGNOSIS — R0600 Dyspnea, unspecified: Secondary | ICD-10-CM | POA: Diagnosis not present

## 2023-12-04 DIAGNOSIS — R0602 Shortness of breath: Secondary | ICD-10-CM | POA: Diagnosis present

## 2023-12-04 DIAGNOSIS — Z955 Presence of coronary angioplasty implant and graft: Secondary | ICD-10-CM | POA: Diagnosis not present

## 2023-12-04 DIAGNOSIS — Z79899 Other long term (current) drug therapy: Secondary | ICD-10-CM | POA: Diagnosis not present

## 2023-12-04 DIAGNOSIS — E039 Hypothyroidism, unspecified: Secondary | ICD-10-CM | POA: Diagnosis not present

## 2023-12-04 DIAGNOSIS — Z7982 Long term (current) use of aspirin: Secondary | ICD-10-CM | POA: Insufficient documentation

## 2023-12-04 DIAGNOSIS — I10 Essential (primary) hypertension: Secondary | ICD-10-CM

## 2023-12-04 DIAGNOSIS — I5032 Chronic diastolic (congestive) heart failure: Secondary | ICD-10-CM | POA: Diagnosis not present

## 2023-12-04 DIAGNOSIS — J441 Chronic obstructive pulmonary disease with (acute) exacerbation: Secondary | ICD-10-CM | POA: Diagnosis not present

## 2023-12-04 DIAGNOSIS — F411 Generalized anxiety disorder: Secondary | ICD-10-CM | POA: Insufficient documentation

## 2023-12-04 DIAGNOSIS — I110 Hypertensive heart disease with heart failure: Secondary | ICD-10-CM | POA: Insufficient documentation

## 2023-12-04 DIAGNOSIS — R0902 Hypoxemia: Secondary | ICD-10-CM | POA: Diagnosis not present

## 2023-12-04 DIAGNOSIS — I5033 Acute on chronic diastolic (congestive) heart failure: Secondary | ICD-10-CM

## 2023-12-04 LAB — I-STAT ARTERIAL BLOOD GAS, ED
Acid-Base Excess: 1 mmol/L (ref 0.0–2.0)
Bicarbonate: 26.7 mmol/L (ref 20.0–28.0)
Calcium, Ion: 1.27 mmol/L (ref 1.15–1.40)
HCT: 37 % (ref 36.0–46.0)
Hemoglobin: 12.6 g/dL (ref 12.0–15.0)
O2 Saturation: 98 %
Patient temperature: 98.4
Potassium: 3.7 mmol/L (ref 3.5–5.1)
Sodium: 139 mmol/L (ref 135–145)
TCO2: 28 mmol/L (ref 22–32)
pCO2 arterial: 43.8 mmHg (ref 32–48)
pH, Arterial: 7.393 (ref 7.35–7.45)
pO2, Arterial: 108 mmHg (ref 83–108)

## 2023-12-04 LAB — BASIC METABOLIC PANEL WITH GFR
Anion gap: 12 (ref 5–15)
BUN: 9 mg/dL (ref 8–23)
CO2: 25 mmol/L (ref 22–32)
Calcium: 9.7 mg/dL (ref 8.9–10.3)
Chloride: 103 mmol/L (ref 98–111)
Creatinine, Ser: 0.67 mg/dL (ref 0.44–1.00)
GFR, Estimated: >60 mL/min (ref >60)
Glucose, Bld: 102 mg/dL — ABNORMAL HIGH (ref 70–99)
Potassium: 4.1 mmol/L (ref 3.5–5.1)
Sodium: 140 mmol/L (ref 135–145)

## 2023-12-04 LAB — CBC WITH DIFFERENTIAL/PLATELET
Abs Immature Granulocytes: 0.11 10*3/uL — ABNORMAL HIGH (ref 0.00–0.07)
Basophils Absolute: 0 10*3/uL (ref 0.0–0.1)
Basophils Relative: 0 %
Eosinophils Absolute: 0.1 10*3/uL (ref 0.0–0.5)
Eosinophils Relative: 1 %
HCT: 41.1 % (ref 36.0–46.0)
Hemoglobin: 13.3 g/dL (ref 12.0–15.0)
Immature Granulocytes: 1 %
Lymphocytes Relative: 21 %
Lymphs Abs: 3.1 10*3/uL (ref 0.7–4.0)
MCH: 31.8 pg (ref 26.0–34.0)
MCHC: 32.4 g/dL (ref 30.0–36.0)
MCV: 98.3 fL (ref 80.0–100.0)
Monocytes Absolute: 1.3 10*3/uL — ABNORMAL HIGH (ref 0.1–1.0)
Monocytes Relative: 9 %
Neutro Abs: 10 10*3/uL — ABNORMAL HIGH (ref 1.7–7.7)
Neutrophils Relative %: 68 %
Platelets: 484 10*3/uL — ABNORMAL HIGH (ref 150–400)
RBC: 4.18 MIL/uL (ref 3.87–5.11)
RDW: 14.7 % (ref 11.5–15.5)
WBC: 14.6 10*3/uL — ABNORMAL HIGH (ref 4.0–10.5)
nRBC: 0 % (ref 0.0–0.2)

## 2023-12-04 LAB — RESP PANEL BY RT-PCR (RSV, FLU A&B, COVID)  RVPGX2
Influenza A by PCR: NEGATIVE
Influenza B by PCR: NEGATIVE
Resp Syncytial Virus by PCR: NEGATIVE
SARS Coronavirus 2 by RT PCR: NEGATIVE

## 2023-12-04 LAB — TROPONIN I (HIGH SENSITIVITY): Troponin I (High Sensitivity): 7 ng/L (ref <18)

## 2023-12-04 LAB — BRAIN NATRIURETIC PEPTIDE: B Natriuretic Peptide: 106.8 pg/mL — ABNORMAL HIGH (ref 0.0–100.0)

## 2023-12-04 LAB — MAGNESIUM: Magnesium: 1.6 mg/dL — ABNORMAL LOW (ref 1.7–2.4)

## 2023-12-04 MED ORDER — TICAGRELOR 60 MG PO TABS
60.0000 mg | ORAL_TABLET | Freq: Two times a day (BID) | ORAL | Status: DC
  Administered 2023-12-04 – 2023-12-05 (×3): 60 mg via ORAL
  Filled 2023-12-04 (×6): qty 1

## 2023-12-04 MED ORDER — FUROSEMIDE 10 MG/ML IJ SOLN: 40.0000 mg | Freq: Once | INTRAMUSCULAR | Status: DC

## 2023-12-04 MED ORDER — DOXEPIN HCL 25 MG PO CAPS
75.0000 mg | ORAL_CAPSULE | Freq: Every day | ORAL | Status: DC
  Administered 2023-12-04: 75 mg via ORAL
  Filled 2023-12-04 (×2): qty 1
  Filled 2023-12-04 (×2): qty 3

## 2023-12-04 MED ORDER — BUSPIRONE HCL 10 MG PO TABS
30.0000 mg | ORAL_TABLET | Freq: Every morning | ORAL | Status: DC
  Administered 2023-12-04 – 2023-12-05 (×2): 30 mg via ORAL
  Filled 2023-12-04 (×2): qty 3

## 2023-12-04 MED ORDER — ASPIRIN 81 MG PO TBEC
81.0000 mg | DELAYED_RELEASE_TABLET | Freq: Every day | ORAL | Status: DC
  Administered 2023-12-04 – 2023-12-05 (×2): 81 mg via ORAL
  Filled 2023-12-04 (×2): qty 1

## 2023-12-04 MED ORDER — FUROSEMIDE 10 MG/ML IJ SOLN
40.0000 mg | Freq: Once | INTRAMUSCULAR | Status: AC
  Administered 2023-12-04: 40 mg via INTRAVENOUS
  Filled 2023-12-04: qty 4

## 2023-12-04 MED ORDER — GABAPENTIN 300 MG PO CAPS
600.0000 mg | ORAL_CAPSULE | Freq: Four times a day (QID) | ORAL | Status: DC
  Administered 2023-12-04 (×4): 600 mg via ORAL
  Filled 2023-12-04 (×4): qty 2

## 2023-12-04 MED ORDER — ESCITALOPRAM OXALATE 10 MG PO TABS
20.0000 mg | ORAL_TABLET | Freq: Every day | ORAL | Status: DC
  Administered 2023-12-04 – 2023-12-05 (×2): 20 mg via ORAL
  Filled 2023-12-04 (×2): qty 2

## 2023-12-04 MED ORDER — GABAPENTIN 600 MG PO TABS: 600.0000 mg | ORAL_TABLET | Freq: Two times a day (BID) | ORAL | Status: DC

## 2023-12-04 MED ORDER — MONTELUKAST SODIUM 10 MG PO TABS
10.0000 mg | ORAL_TABLET | Freq: Every day | ORAL | Status: DC
  Administered 2023-12-04: 10 mg via ORAL
  Filled 2023-12-04: qty 1

## 2023-12-04 MED ORDER — RIVAROXABAN 10 MG PO TABS
10.0000 mg | ORAL_TABLET | Freq: Every day | ORAL | Status: DC
  Administered 2023-12-04 – 2023-12-05 (×2): 10 mg via ORAL
  Filled 2023-12-04 (×2): qty 1

## 2023-12-04 MED ORDER — IPRATROPIUM-ALBUTEROL 0.5-2.5 (3) MG/3ML IN SOLN: 3.0000 mL | Freq: Once | RESPIRATORY_TRACT | Status: DC

## 2023-12-04 MED ORDER — NICOTINE 21 MG/24HR TD PT24
21.0000 mg | MEDICATED_PATCH | Freq: Every day | TRANSDERMAL | Status: DC
  Administered 2023-12-05: 21 mg via TRANSDERMAL
  Filled 2023-12-04: qty 1

## 2023-12-04 MED ORDER — NICOTINE 14 MG/24HR TD PT24
14.0000 mg | MEDICATED_PATCH | Freq: Every day | TRANSDERMAL | Status: DC
Start: 2023-12-04 — End: 2023-12-04
  Administered 2023-12-04: 14 mg via TRANSDERMAL
  Filled 2023-12-04: qty 1

## 2023-12-04 MED ORDER — IPRATROPIUM-ALBUTEROL 0.5-2.5 (3) MG/3ML IN SOLN
3.0000 mL | RESPIRATORY_TRACT | Status: DC
  Administered 2023-12-04 – 2023-12-05 (×6): 3 mL via RESPIRATORY_TRACT
  Filled 2023-12-04 (×7): qty 3

## 2023-12-04 MED ORDER — FUROSEMIDE 10 MG/ML IJ SOLN: 20.0000 mg | Freq: Once | INTRAMUSCULAR | Status: DC

## 2023-12-04 MED ORDER — BUDESON-GLYCOPYRROL-FORMOTEROL 160-9-4.8 MCG/ACT IN AERO
2.0000 | INHALATION_SPRAY | Freq: Two times a day (BID) | RESPIRATORY_TRACT | Status: DC
  Administered 2023-12-04 – 2023-12-05 (×3): 2 via RESPIRATORY_TRACT
  Filled 2023-12-04: qty 5.9

## 2023-12-04 MED ORDER — LEVOTHYROXINE SODIUM 112 MCG PO TABS
112.0000 ug | ORAL_TABLET | Freq: Every day | ORAL | Status: DC
  Administered 2023-12-04 – 2023-12-05 (×2): 112 ug via ORAL
  Filled 2023-12-04 (×2): qty 1

## 2023-12-04 MED ORDER — AZITHROMYCIN 250 MG PO TABS: 500.0000 mg | ORAL_TABLET | Freq: Every day | ORAL | Status: DC

## 2023-12-04 MED ORDER — FUROSEMIDE 40 MG PO TABS
20.0000 mg | ORAL_TABLET | Freq: Every morning | ORAL | Status: DC
  Administered 2023-12-05: 20 mg via ORAL
  Filled 2023-12-04: qty 1

## 2023-12-04 MED ORDER — NEBIVOLOL HCL 10 MG PO TABS
10.0000 mg | ORAL_TABLET | Freq: Every day | ORAL | Status: DC
  Administered 2023-12-04: 10 mg via ORAL
  Filled 2023-12-04 (×2): qty 1

## 2023-12-04 MED ORDER — ONDANSETRON HCL 4 MG/2ML IJ SOLN
4.0000 mg | Freq: Once | INTRAMUSCULAR | Status: AC
  Administered 2023-12-04: 4 mg via INTRAVENOUS
  Filled 2023-12-04: qty 2

## 2023-12-04 MED ORDER — BUSPIRONE HCL 5 MG PO TABS
15.0000 mg | ORAL_TABLET | Freq: Every day | ORAL | Status: DC
  Administered 2023-12-04 – 2023-12-05 (×2): 15 mg via ORAL
  Filled 2023-12-04: qty 2
  Filled 2023-12-04 (×2): qty 1

## 2023-12-04 MED ORDER — LOSARTAN POTASSIUM 50 MG PO TABS
75.0000 mg | ORAL_TABLET | Freq: Every day | ORAL | Status: DC
  Administered 2023-12-04 – 2023-12-05 (×2): 75 mg via ORAL
  Filled 2023-12-04 (×2): qty 2

## 2023-12-04 MED ORDER — CLONAZEPAM 0.5 MG PO TABS
0.5000 mg | ORAL_TABLET | Freq: Two times a day (BID) | ORAL | Status: DC
  Administered 2023-12-04 – 2023-12-05 (×3): 0.5 mg via ORAL
  Filled 2023-12-04 (×3): qty 1

## 2023-12-04 MED ORDER — METHYLPREDNISOLONE SODIUM SUCC 125 MG IJ SOLR
125.0000 mg | INTRAMUSCULAR | Status: AC
  Administered 2023-12-04: 125 mg via INTRAVENOUS
  Filled 2023-12-04: qty 2

## 2023-12-04 MED ORDER — ATORVASTATIN CALCIUM 80 MG PO TABS
80.0000 mg | ORAL_TABLET | Freq: Every day | ORAL | Status: DC
  Administered 2023-12-04 – 2023-12-05 (×2): 80 mg via ORAL
  Filled 2023-12-04 (×2): qty 1

## 2023-12-04 MED ORDER — MIRTAZAPINE 15 MG PO TABS
45.0000 mg | ORAL_TABLET | Freq: Every day | ORAL | Status: DC
  Administered 2023-12-04: 45 mg via ORAL
  Filled 2023-12-04: qty 3

## 2023-12-04 MED ORDER — GABAPENTIN 600 MG PO TABS
600.0000 mg | ORAL_TABLET | Freq: Two times a day (BID) | ORAL | 2 refills | Status: AC
  Filled 2023-12-04: qty 180, 90d supply, fill #0
  Filled 2024-02-26: qty 180, 90d supply, fill #1
  Filled 2024-05-26: qty 180, 90d supply, fill #2

## 2023-12-04 MED ORDER — AMLODIPINE BESYLATE 5 MG PO TABS
5.0000 mg | ORAL_TABLET | Freq: Every day | ORAL | Status: DC
  Administered 2023-12-04 – 2023-12-05 (×2): 5 mg via ORAL
  Filled 2023-12-04 (×2): qty 1

## 2023-12-04 MED ORDER — EZETIMIBE 10 MG PO TABS
10.0000 mg | ORAL_TABLET | Freq: Every day | ORAL | Status: DC
  Administered 2023-12-04 – 2023-12-05 (×2): 10 mg via ORAL
  Filled 2023-12-04 (×2): qty 1

## 2023-12-04 MED ORDER — SODIUM CHLORIDE 0.9 % IV SOLN
500.0000 mg | Freq: Once | INTRAVENOUS | Status: AC
  Administered 2023-12-04: 500 mg via INTRAVENOUS
  Filled 2023-12-04: qty 5

## 2023-12-04 MED ORDER — ACETAMINOPHEN 325 MG PO TABS
650.0000 mg | ORAL_TABLET | Freq: Four times a day (QID) | ORAL | Status: DC | PRN
  Administered 2023-12-04: 650 mg via ORAL
  Filled 2023-12-04: qty 2

## 2023-12-04 MED ORDER — IPRATROPIUM-ALBUTEROL 0.5-2.5 (3) MG/3ML IN SOLN
RESPIRATORY_TRACT | Status: AC
  Administered 2023-12-04: 3 mL via RESPIRATORY_TRACT
  Filled 2023-12-04: qty 3

## 2023-12-04 MED ORDER — BUSPIRONE HCL 5 MG PO TABS
15.0000 mg | ORAL_TABLET | Freq: Every evening | ORAL | Status: DC
  Administered 2023-12-04: 15 mg via ORAL
  Filled 2023-12-04: qty 1

## 2023-12-04 MED ORDER — SODIUM CHLORIDE 0.9 % IV SOLN
1.0000 g | Freq: Once | INTRAVENOUS | Status: AC
  Administered 2023-12-04: 1 g via INTRAVENOUS
  Filled 2023-12-04: qty 10

## 2023-12-04 MED ORDER — MAGNESIUM SULFATE 2 GM/50ML IV SOLN
2.0000 g | Freq: Once | INTRAVENOUS | Status: AC
  Administered 2023-12-04: 2 g via INTRAVENOUS
  Filled 2023-12-04: qty 50

## 2023-12-04 MED ORDER — IPRATROPIUM-ALBUTEROL 0.5-2.5 (3) MG/3ML IN SOLN: 3.0000 mL | Freq: Once | RESPIRATORY_TRACT | Status: AC

## 2023-12-04 MED ORDER — PREDNISONE 10 MG PO TABS
40.0000 mg | ORAL_TABLET | Freq: Every day | ORAL | Status: DC
  Administered 2023-12-04 – 2023-12-05 (×2): 40 mg via ORAL
  Filled 2023-12-04: qty 2
  Filled 2023-12-04: qty 4

## 2023-12-04 MED ORDER — PANTOPRAZOLE SODIUM 40 MG PO TBEC
40.0000 mg | DELAYED_RELEASE_TABLET | Freq: Every day | ORAL | Status: DC
  Administered 2023-12-04 – 2023-12-05 (×2): 40 mg via ORAL
  Filled 2023-12-04 (×2): qty 1

## 2023-12-04 NOTE — Progress Notes (Signed)
 Transition of Care [facility]) - Inpatient Brief Assessment   Patient Details  Name: [name] [name] MRN: [id number] Date of Birth: Oct 27, 1955  [facility]) CM/SW Contact:    Joanette Moynahan, RN Phone Number: 12/04/2023, 9:28 AM   Clinical Narrative: RNCM met with pt and daughter at beside regarding discharge plan.  Pt states she lives at home alone and has had home health services in the past that have served her well.  Pt plans to return home with home health.  Home DME: oxygen  ([employer]), walker and shower chair    Transition of Care Asessment: Insurance and Status: (P) Insurance coverage has been reviewed Patient has primary care physician: (P) Yes Home environment has been reviewed: (P) from home alone Prior level of function:: (P) independent; daughter, Jyl Or extremely helpful with getting her to and from appointments Prior/Current Home Services: (P) No current home services (previously active with [facility]) Social Drivers of Health Review: (P) SDOH reviewed no interventions necessary Readmission risk has been reviewed: (P) Yes Transition of care needs: (P) transition of care needs identified, TOC will continue to follow

## 2023-12-04 NOTE — Progress Notes (Signed)
   12/04/23 0730  Therapy Vitals  Pulse Rate 82  Resp 17  MEWS Score/Color  MEWS Score 0  MEWS Score Color Green  Oxygen  Therapy/Pulse Ox  O2 Device Nasal Cannula  O2 Therapy Oxygen   O2 Flow Rate (L/min) 2 L/min  SpO2 98 %    RT came to bedside to assess pt after being taken off BIPAP around 4 am this morning. Pt is not in resp distress and wants food. BIPAP on standby. RT will monitor.

## 2023-12-04 NOTE — ED Notes (Signed)
 Pt in bed, pt c/o sob, pt talking in short sentences, pt reports some anxiety, calmed pt, pt requests food, food given, replaced O2 via , pt satting mid 90s on 2L

## 2023-12-04 NOTE — Evaluation (Signed)
 Physical Therapy Brief Evaluation and Discharge Note Patient Details Name: [name] [name] MRN: [id number] DOB: September 28, 1955 Today's Date: 12/04/2023   History of Present Illness  Pt is 68 yo presenting to [facility] 5/22 due to shortness of breathe with recent hospitalization on 5/13 with subsequent CIR stay.  PMH: HTN, CAD with multiple DES, DM2, COPD, anxiety, hypothyroidism, RTC repair 12/24/2021, ongoing tobacco abuse.  Clinical Impression  Pt is presenting at Mod I to Ind without an AD for all functional activities. Pt has assistance from daughter as needed. Currently pt is presenting at baseline level of functioning and no skilled physical therapy services recommended. Pt will be discharged from skilled physical therapy services at this time; please re-consult if further needs arise.           PT Assessment Patient does not need any further PT services     Equipment Recommendations None recommended by PT     Precautions/Restrictions Precautions Precautions: None Recall of Precautions/Restrictions: Intact Restrictions Weight Bearing Restrictions Per Provider Order: No        Mobility  Bed Mobility   Supine/Sidelying to sit: Modified independent (Device/Increased time) Sit to supine/sidelying: Modified independent (Device/Increased time)    Transfers Overall transfer level: Independent Equipment used: None      Ambulation/Gait Ambulation/Gait assistance: Modified independent (Device/Increase time) Gait Distance (Feet): 24 Feet Assistive device: None Gait Pattern/deviations: Step-through pattern, Decreased stride length   General Gait Details: O2 sats remained in the 90's     Balance Overall balance assessment: Independent        Pertinent Vitals/Pain   Pain Assessment Pain Assessment: No/denies pain     Home Living Family/patient expects to be discharged to:: Private residence Living Arrangements: Alone Available Help at Discharge: Family;Available  PRN/intermittently Home Environment: Stairs to enter  Stairs-Number of Steps: 2 Home Equipment: Agricultural consultant (2 wheels);Shower seat;Hand held shower head;Grab bars - tub/shower;Rollator (4 wheels)        Prior Function Level of Independence: Independent with assistive device(s)      UE/LE Assessment   UE ROM/Strength/Tone/Coordination: WFL    LE ROM/Strength/Tone/Coordination: [facility]      Communication   Communication Communication: No apparent difficulties     Cognition Overall Cognitive Status: Appears within functional limits for tasks assessed/performed       General Comments General comments (skin integrity, edema, etc.): Pt became slightly short of breathe. HR/O2 sats remained WNL        Assessment/Plan           No Skilled PT All education completed;Patient at baseline level of functioning;Patient is modified independent with all activity/mobility    AMPAC 6 Clicks Help needed turning from your back to your side while in a flat bed without using bedrails?: None Help needed moving from lying on your back to sitting on the side of a flat bed without using bedrails?: None Help needed moving to and from a bed to a chair (including a wheelchair)?: None Help needed standing up from a chair using your arms (e.g., wheelchair or bedside chair)?: None Help needed to walk in hospital room?: None Help needed climbing 3-5 steps with a railing? : None 6 Click Score: 24      End of Session Equipment Utilized During Treatment: Oxygen ;Gait belt Activity Tolerance: Patient tolerated treatment well Patient left: in bed;with call bell/phone within reach Nurse Communication: Mobility status       Time: 1130-1147 PT Time Calculation (min) (ACUTE ONLY): 17 min  Charges:   PT Evaluation $  PT Eval Low Complexity: 1 Low      Sloan Duncans, DPT, CLT  Acute Rehabilitation Services Office: [phone] (Secure chat preferred)   Jenice Mitts  12/04/2023,  12:51 PM

## 2023-12-04 NOTE — ED Provider Notes (Signed)
 [facility]GENCY DEPARTMENT AT [facility] Provider Note   CSN: [id number] Arrival date & time: 12/04/23  0041     History  Chief Complaint  Patient presents with   Shortness of Breath    Pt arrived by ems d/t sudden SOB occurring at approx 2345 last night while pt was lying in bed with no exertion per ems. Per ems, pt is chronically on 2L at home and has hx of COPD and CHF. Pt was given 2 sublingual nitroglycerins during transit by ems and placed on CPAP d/t labored and restless breathing.     [name] [name] is a 68 y.o. female.  The history is provided by the EMS personnel. The history is limited by the condition of the patient.  Shortness of Breath Severity:  Severe Onset quality:  Sudden Timing:  Constant Progression:  Unchanged Chronicity:  Recurrent Context: not pollens   Relieved by:  Nothing Worsened by:  Nothing Associated symptoms: wheezing   Risk factors: no recent alcohol use, no prolonged immobilization and no recent surgery   Patient with COPD with COPD exacerbation.      Past Medical History:  Diagnosis Date   Allergy    seasonal   Anemia    Anxiety    takes Lexapro  daily   Arthritis    "back, from neck down pass my bra area" (03/25/2017)   Asthma    Bartholin gland cyst 08/29/2011   Bruises easily    pt is on Effient    Chronic back pain    herniated nucleus pulposus   Chronic back pain    "neck to bra area; lower back" (03/25/2017)   Chronic kidney disease    recurrent UTI's this year 2022   COPD (chronic obstructive pulmonary disease) (HCC)    early stages   Coronary artery disease    Depression    takes Klonopin  daily   Diabetes mellitus without complication (HCC)    Diverticulosis    Fibroadenoma of left breast    GERD (gastroesophageal reflux disease)    takes Nexium  daily   H/O hiatal hernia    Heart attack (HCC) 2011   Hemorrhoids    Hernia    Hyperlipidemia    takes Lipitor  daily   Hypertension    takes Losartan   daily and Labetalol  bid   Hypothyroidism    takes Synthroid  daily   Insomnia    hydroxyzine  prn   Joint pain    Pneumonia    "couple times" (03/25/2017)   Pre-diabetes    "just found out 1 wk ago" (03/25/2017)   Psoriasis    elbows,knees,back   Shortness of breath    with exertion   Slowing of urinary stream    Stress incontinence      Home Medications Prior to Admission medications   Medication Sig Start Date End Date Taking? Authorizing Provider  acetaminophen  (TYLENOL ) 500 MG tablet Take 2 tablets (1,000 mg total) by mouth every 6 (six) hours. Patient taking differently: Take 1,000 mg by mouth in the morning and at bedtime. 09/25/23   Elwin Hammond, PA-C  albuterol  (VENTOLIN  HFA) 108 (90 Base) MCG/ACT inhaler INHALE 2 PUFFS into THE lungs EVERY 6 HOURS AS NEEDED wheezing AND SHORTNESS OF BREATH 10/15/23   Lawrance Presume, MD  alendronate  (FOSAMAX ) 70 MG tablet Take 1 tablet (70 mg total) by mouth every 7 (seven) days. Take with a full glass of water on an empty stomach. 10/25/23   Lawrance Presume, MD  amLODipine  (  NORVASC ) 5 MG tablet Take 1 tablet (5 mg total) by mouth daily. Patient taking differently: Take 5 mg by mouth in the morning. 09/22/23   Jude Norton, NP  aspirin  EC 81 MG tablet Take 81 mg by mouth in the morning.    [provider]  atorvastatin  (LIPITOR ) 80 MG tablet Take 1 tablet (80 mg total) by mouth daily. Patient taking differently: Take 80 mg by mouth in the morning. 09/22/23   Jude Norton, NP  Blood Glucose Monitoring Suppl (ACCU-CHEK GUIDE) w/Device KIT Use to check blood sugars once a day 09/05/23   Lawrance Presume, MD  busPIRone  (BUSPAR ) 15 MG tablet Take 2 tablets (30 mg total) by mouth in the morning AND 1 tablet (15 mg total) daily at 12 noon AND 1 tablet (15 mg total) every evening. 11/17/23   Nwoko, Uchenna E, PA  clonazePAM  (KLONOPIN ) 0.5 MG tablet Take 1 tablet (0.5 mg total) by mouth 3 (three) times daily as needed for  anxiety. Patient taking differently: Take 0.5 mg by mouth in the morning and at bedtime. 09/26/23   Nwoko, Uchenna E, PA  docusate sodium  (COLACE) 100 MG capsule Take 1 capsule (100 mg total) by mouth 2 (two) times daily as needed for mild constipation. 09/25/23   Elwin Hammond, PA-C  doxepin  (SINEQUAN ) 75 MG capsule Take 1 capsule (75 mg total) by mouth at bedtime. 11/17/23   Nwoko, Uchenna E, PA  escitalopram  (LEXAPRO ) 20 MG tablet Take 1 tablet (20 mg total) by mouth daily. 11/17/23   Nwoko, Uchenna E, PA  ezetimibe  (ZETIA ) 10 MG tablet Take 1 tablet (10 mg total) by mouth daily. Patient taking differently: Take 10 mg by mouth at bedtime. 07/04/23   Meng, Hao, PA  Fluticasone -Umeclidin-Vilant (TRELEGY ELLIPTA ) 200-62.5-25 MCG/ACT AEPB Inhale 1 puff into the lungs daily. Patient taking differently: Inhale 1 puff into the lungs in the morning. 08/07/23   Wilfredo Hanly, MD  furosemide  (LASIX ) 20 MG tablet Take 1 tablet (20 mg total) by mouth daily. Patient taking differently: Take 20 mg by mouth in the morning. 09/22/23   Jude Norton, NP  gabapentin  (NEURONTIN ) 600 MG tablet TAKE 1 TABLET BY MOUTH 4 TIMES DAILY Patient taking differently: Take 600 mg by mouth 2 (two) times daily. 12/20/22   Lawrance Presume, MD  glucose blood (TRUE METRIX BLOOD GLUCOSE TEST) test strip Use as instructed to check blood sugars once a day 09/05/23   Lawrance Presume, MD  guaiFENesin  (MUCINEX ) 600 MG 12 hr tablet Take 1 tablet (600 mg total) by mouth 2 (two) times daily as needed for up to 8 doses. Take for cough/phlegm. 11/27/23   Carleen Chary, DO  ipratropium-albuterol  (DUONEB) 0.5-2.5 (3) MG/3ML SOLN Take 3 mLs by nebulization every 8 (eight) hours. 11/27/23   Priscella Brooms, DO  levothyroxine  (SYNTHROID ) 112 MCG tablet Take 1 tablet (112 mcg total) by mouth daily. Patient taking differently: Take 112 mcg by mouth daily before breakfast. 12/20/22   Lawrance Presume, MD  lidocaine  (LIDODERM ) 5 % Place 1  patch onto the skin daily. Remove & Discard patch within 12 hours or as directed by MD Patient not taking: Reported on 11/12/2023 09/25/23   Elwin Hammond, PA-C  losartan  (COZAAR ) 50 MG tablet Take 1.5 tablets (75 mg total) by mouth daily. 11/12/23   Jude Norton, NP  metFORMIN  (GLUCOPHAGE ) 500 MG tablet Take 1 tablet (500 mg total) by mouth 2 (two) times daily with a meal.  11/12/23   Lawrance Presume, MD  mirtazapine  (REMERON ) 45 MG tablet Take 1 tablet (45 mg total) by mouth at bedtime. 11/17/23   Nwoko, Uchenna E, PA  montelukast  (SINGULAIR ) 10 MG tablet Take 1 tablet (10 mg total) by mouth at bedtime. 11/11/23   Lawrance Presume, MD  nebivolol  (BYSTOLIC ) 10 MG tablet Take 1 tablet (10 mg total) by mouth daily. Patient taking differently: Take 10 mg by mouth at bedtime. 09/22/23   Jude Norton, NP  nicotine  (NICODERM CQ  - DOSED IN MG/24 HOURS) 14 mg/24hr patch Place 1 patch (14 mg total) onto the skin daily. 10/09/23   Leona Rake, MD  nitroGLYCERIN  (NITROSTAT ) 0.4 MG SL tablet Place 1 tablet (0.4 mg total) under the tongue every 5 (five) minutes as needed for chest pain. Patient not taking: Reported on 11/26/2023 09/18/22 08/31/24  Jude Norton, NP  omeprazole  (PRILOSEC) 40 MG capsule Take 1 capsule (40 mg total) by mouth 2 (two) times daily before a meal. 11/06/23   Danis, Cordelia Dessert, MD  ondansetron  (ZOFRAN -ODT) 4 MG disintegrating tablet Take 1 tablet (4 mg total) by mouth every 8 (eight) hours as needed for nausea or vomiting. 11/12/23   Spence Dux, PA-C  polyethylene glycol (MIRALAX  / GLYCOLAX ) 17 g packet Take 17 g by mouth daily as needed for severe constipation. 09/25/23   Elwin Hammond, PA-C  predniSONE  (DELTASONE ) 20 MG tablet Take 2 tablets (40 mg total) by mouth daily with breakfast. 11/28/23   Carleen Chary, DO  Spacer/Aero-Holding [facility] Use with the albuterol  inhaler. 07/22/23   Adria Hopkins, MD  ticagrelor  (BRILINTA ) 60 MG TABS tablet Take 1 tablet (60 mg  total) by mouth 2 (two) times daily. 11/12/23   Jude Norton, NP  TRUEplus Lancets 28G MISC Use to check blood sugars once a day 09/05/23   Lawrance Presume, MD      Allergies    Avocado, Latex, and Codeine     Review of Systems   Review of Systems  Unable to perform ROS: Acuity of condition  Respiratory:  Positive for shortness of breath and wheezing.     Physical Exam Updated Vital Signs BP (!) 93/59   Pulse 87   Temp 98 F (36.7 C) (Axillary)   Resp 18   Ht 4\' 9"  (1.448 m)   Wt 69 kg   SpO2 100%   BMI 32.92 kg/m  Physical Exam Vitals and nursing note reviewed.  Constitutional:      Appearance: She is well-developed. She is not diaphoretic.  HENT:     Head: Normocephalic and atraumatic.     Nose: Nose normal.  Eyes:     Pupils: Pupils are equal, round, and reactive to light.  Cardiovascular:     Rate and Rhythm: Normal rate and regular rhythm.     Pulses: Normal pulses.     Heart sounds: Normal heart sounds.  Pulmonary:     Effort: Pulmonary effort is normal. No respiratory distress.     Breath sounds: Wheezing and rhonchi present.  Abdominal:     General: Bowel sounds are normal. There is no distension.     Palpations: Abdomen is soft.     Tenderness: There is no abdominal tenderness. There is no guarding or rebound.  Musculoskeletal:        General: Normal range of motion.     Cervical back: Normal range of motion and neck supple.  Skin:    General: Skin is warm and dry.  Capillary Refill: Capillary refill takes less than 2 seconds.     Findings: No erythema or rash.  Neurological:     General: No focal deficit present.     Mental Status: She is alert and oriented to person, place, and time.     Deep Tendon Reflexes: Reflexes normal.  Psychiatric:        Mood and Affect: Mood normal.     ED Results / Procedures / Treatments   Labs (all labs ordered are listed, but only abnormal results are displayed) Results for orders placed or performed during  the hospital encounter of 12/04/23  Resp panel by RT-PCR (RSV, Flu A&B, Covid) Anterior Nasal Swab   Collection Time: 12/04/23 12:45 AM   Specimen: Anterior Nasal Swab  Result Value Ref Range   SARS Coronavirus 2 by RT PCR NEGATIVE NEGATIVE   Influenza A by PCR NEGATIVE NEGATIVE   Influenza B by PCR NEGATIVE NEGATIVE   Resp Syncytial Virus by PCR NEGATIVE NEGATIVE  CBC with Differential   Collection Time: 12/04/23 12:45 AM  Result Value Ref Range   WBC 14.6 (H) 4.0 - 10.5 K/uL   RBC 4.18 3.87 - 5.11 MIL/uL   Hemoglobin 13.3 12.0 - 15.0 g/dL   HCT 16.1 09.6 - 04.5 %   MCV 98.3 80.0 - 100.0 fL   MCH 31.8 26.0 - 34.0 pg   MCHC 32.4 30.0 - 36.0 g/dL   RDW 40.9 81.1 - 91.4 %   Platelets 484 (H) 150 - 400 K/uL   nRBC 0.0 0.0 - 0.2 %   Neutrophils Relative % 68 %   Neutro Abs 10.0 (H) 1.7 - 7.7 K/uL   Lymphocytes Relative 21 %   Lymphs Abs 3.1 0.7 - 4.0 K/uL   Monocytes Relative 9 %   Monocytes Absolute 1.3 (H) 0.1 - 1.0 K/uL   Eosinophils Relative 1 %   Eosinophils Absolute 0.1 0.0 - 0.5 K/uL   Basophils Relative 0 %   Basophils Absolute 0.0 0.0 - 0.1 K/uL   Immature Granulocytes 1 %   Abs Immature Granulocytes 0.11 (H) 0.00 - 0.07 K/uL  Basic metabolic panel   Collection Time: 12/04/23 12:45 AM  Result Value Ref Range   Sodium 140 135 - 145 mmol/L   Potassium 4.1 3.5 - 5.1 mmol/L   Chloride 103 98 - 111 mmol/L   CO2 25 22 - 32 mmol/L   Glucose, Bld 102 (H) 70 - 99 mg/dL   BUN 9 8 - 23 mg/dL   Creatinine, Ser 7.82 0.44 - 1.00 mg/dL   Calcium  9.7 8.9 - 10.3 mg/dL   GFR, Estimated >95 >62 mL/min   Anion gap 12 5 - 15  Brain natriuretic peptide   Collection Time: 12/04/23 12:45 AM  Result Value Ref Range   B Natriuretic Peptide 106.8 (H) 0.0 - 100.0 pg/mL  Troponin I (High Sensitivity)   Collection Time: 12/04/23 12:45 AM  Result Value Ref Range   Troponin I (High Sensitivity) 7 <18 ng/L  I-Stat arterial blood gas, ED ([facility], [facility], [facility])   Collection Time: 12/04/23  1:49  AM  Result Value Ref Range   pH, Arterial 7.393 7.35 - 7.45   pCO2 arterial 43.8 32 - 48 mmHg   pO2, Arterial 108 83 - 108 mmHg   Bicarbonate 26.7 20.0 - 28.0 mmol/L   TCO2 28 22 - 32 mmol/L   O2 Saturation 98 %   Acid-Base Excess 1.0 0.0 - 2.0 mmol/L   Sodium 139 135 -  145 mmol/L   Potassium 3.7 3.5 - 5.1 mmol/L   Calcium , Ion 1.27 1.15 - 1.40 mmol/L   HCT 37.0 36.0 - 46.0 %   Hemoglobin 12.6 12.0 - 15.0 g/dL   Patient temperature 16.1 F    Collection site RADIAL, ALLEN'S TEST ACCEPTABLE    Drawn by RT    Sample type ARTERIAL    *Note: Due to a large number of results and/or encounters for the requested time period, some results have not been displayed. A complete set of results can be found in Results Review.   DG Chest Portable 1 View Result Date: 12/04/2023 CLINICAL DATA:  Dyspnea EXAM: PORTABLE CHEST 1 VIEW COMPARISON:  None Available. FINDINGS: The heart size and mediastinal contours are within normal limits. Both lungs are clear. The visualized skeletal structures are unremarkable. IMPRESSION: No active disease. Electronically Signed   By: Worthy Heads M.D.   On: 12/04/2023 01:17   DG Chest Portable 1 View Result Date: 11/25/2023 CLINICAL DATA:  Shortness of breath. EXAM: PORTABLE CHEST 1 VIEW COMPARISON:  Shannie Kontos 29, 2025 FINDINGS: The heart size and mediastinal contours are within normal limits. There is marked severity calcification of the aortic arch. Low lung volumes are noted. Mild to moderate severity areas of scarring and/or atelectasis are seen within the bilateral lung bases. There is a small left pleural effusion. No pneumothorax is identified. There is a chronic fracture of the mid left clavicle. IMPRESSION: 1. Low lung volumes with mild to moderate severity bibasilar scarring and/or atelectasis. 2. Small left pleural effusion. Electronically Signed   By: Virgle Grime M.D.   On: 11/25/2023 20:41   CT CHEST ABDOMEN PELVIS WO CONTRAST Result Date: 11/12/2023 EXAM: CT  CHEST, ABDOMEN AND PELVIS WITHOUT CONTRAST 11/12/2023 01:57:59 AM TECHNIQUE: CT of the chest, abdomen and pelvis was performed without the administration of intravenous contrast. Multiplanar reformatted images are provided for review. Automated exposure control, iterative reconstruction, and/or weight based adjustment of the mA/kV was utilized to reduce the radiation dose to as low as reasonably achievable. COMPARISON: CTA chest dated 10/05/2023 and CT abdomen and pelvis dated 09/16/2023. CLINICAL HISTORY: Right sided abdominal pain and lower chest pain, history of aspiration pneumonia and right sided rib fracture. Patient presents with complaints of right upper quadrant pain, dizziness, nausea and vomiting since Easter. Several days ago noticed stool was white. FINDINGS: CHEST: MEDIASTINUM: Heart and pericardium are unremarkable. The central airways are clear. Thoracic aortic atherosclerosis. LYMPH NODES: No evidence of mediastinal, hilar or axillary lymphadenopathy. LUNGS AND PLEURA: Mild bilateral lower lobe scarring versus atelectasis with bronchiectasis. Residual 7 mm nodule in the posterior lateral lobe, previously 12 mm, infectious or inflammatory. Given rapid improvement, no follow-up is recommended Mild centrilobular and paraseptal emphysematous changes, upper lung predominant. No evidence of pleural effusion or pneumothorax. ABDOMEN AND PELVIS: HEPATOBILIARY: The liver is unremarkable. Gallbladder is unremarkable. No biliary ductal dilatation. SPLEEN: No acute abnormality. PANCREAS: No acute abnormality. ADRENAL GLANDS: No acute abnormality. KIDNEYS, URETERS AND BLADDER: No stones in the kidneys or ureters. No evidence of hydronephrosis. No evidence of perinephric or periureteral stranding. Urinary bladder is unremarkable. GI AND BOWEL: Stomach demonstrates no acute abnormality. There is no evidence of bowel obstruction. No evidence of appendicitis. Sigmoid diverticulosis with very mild pericolonic  inflammatory changes in the left lower pelvis, suggesting mild diverticulitis. No drainable fluid collection/abscess. Normal appendix. REPRODUCTIVE ORGANS: Status post hysterectomy. PERITONEUM AND RETROPERITONEUM: No evidence of ascites or free air. Atherosclerotic calcifications of the abdominal aorta and branch vessels.  LYMPH NODES: No evidence of lymphadenopathy. BONES AND SOFT TISSUES: Multiple healing right posterior and lateral rib fractures. Mild degenerative changes of the visualized thoracolumbar spine. No focal soft tissue abnormality. IMPRESSION: 1. Mild sigmoid diverticulitis. No drainable fluid collection/abscess. No free air. 2. Multiple healing right rib fractures. No pneumothorax. 3. Additional ancillary findings, as above. Electronically signed by: Zadie Herter MD 11/12/2023 02:07 AM EDT RP Workstation: [id number]   US  Abdomen Limited RUQ (LIVER/GB) Result Date: 11/12/2023 CLINICAL DATA:  [id number] RUQ pain 151471 EXAM: ULTRASOUND ABDOMEN LIMITED RIGHT UPPER QUADRANT COMPARISON:  CT abdomen pelvis 09/16/2023 FINDINGS: Gallbladder: No gallstones or wall thickening visualized. No sonographic Murphy sign noted by sonographer. Common bile duct: Diameter: 2 mm Liver: No focal lesion identified. Within normal limits in parenchymal echogenicity. Portal vein is patent on color Doppler imaging with normal direction of blood flow towards the liver. Other: None. IMPRESSION: Unremarkable right upper quadrant ultrasound. Electronically Signed   By: Morgane  Naveau M.D.   On: 11/12/2023 00:46   DG Chest Portable 1 View Result Date: 11/11/2023 EXAM: 1 VIEW XRAY OF THE CHEST 11/11/2023 11:18:49 PM COMPARISON: 10/24/2023 CLINICAL HISTORY: Right rib pain. FINDINGS: LUNGS AND PLEURA: Mild bibasilar opacities, likely scarring/atelectasis. No consolidation. No pulmonary edema. No pleural effusion. No pneumothorax. HEART AND MEDIASTINUM: No acute abnormality of the cardiac and mediastinal silhouettes. Thoracic  aortic atherosclerosis. BONES AND SOFT TISSUES: No acute osseous abnormality. IMPRESSION: 1. No acute findings. 2. Mild bibasilar opacities, likely scarring/atelectasis. Electronically signed by: Zadie Herter MD 11/11/2023 11:24 PM EDT RP Workstation: [id number]    EKG None  Radiology DG Chest Portable 1 View Result Date: 12/04/2023 CLINICAL DATA:  Dyspnea EXAM: PORTABLE CHEST 1 VIEW COMPARISON:  None Available. FINDINGS: The heart size and mediastinal contours are within normal limits. Both lungs are clear. The visualized skeletal structures are unremarkable. IMPRESSION: No active disease. Electronically Signed   By: Worthy Heads M.D.   On: 12/04/2023 01:17    Procedures .Critical Care  Performed by: Tonya Fredrickson, MD Authorized by: Tonya Fredrickson, MD   Critical care provider statement:    Critical care time (minutes):  30   Critical care end time:  12/04/2023 4:42 AM   Critical care was necessary to treat or prevent imminent or life-threatening deterioration of the following conditions:  Respiratory failure   Critical care was time spent personally by me on the following activities:  Development of treatment plan with patient or surrogate, discussions with consultants, evaluation of patient's response to treatment, examination of patient, ordering and review of laboratory studies, ordering and review of radiographic studies, ordering and performing treatments and interventions, pulse oximetry, re-evaluation of patient's condition and review of old charts   I assumed direction of critical care for this patient from another provider in my specialty: no     Care discussed with: admitting provider       Medications Ordered in ED Medications  cefTRIAXone  (ROCEPHIN ) 1 g in sodium chloride  0.9 % 100 mL IVPB (has no administration in time range)  azithromycin  (ZITHROMAX ) 500 mg in sodium chloride  0.9 % 250 mL IVPB (has no administration in time range)  rivaroxaban  (XARELTO ) tablet 10 mg  (has no administration in time range)  predniSONE  (DELTASONE ) tablet 40 mg (has no administration in time range)  ipratropium-albuterol  (DUONEB) 0.5-2.5 (3) MG/3ML nebulizer solution 3 mL (has no administration in time range)  furosemide  (LASIX ) injection 40 mg (has no administration in time range)  ipratropium-albuterol  (DUONEB) 0.5-2.5 (3) MG/3ML nebulizer solution 3 mL (3 mLs  Nebulization Given 12/04/23 0249)  methylPREDNISolone  sodium succinate (SOLU-MEDROL ) 125 mg/2 mL injection 125 mg (125 mg Intravenous Given 12/04/23 0259)    ED Course/ Medical Decision Making/ A&P                                 Medical Decision Making Patient with SOB placed on CPAP by ems  Amount and/or Complexity of Data Reviewed Independent Historian: EMS    Details: See above  External Data Reviewed: notes.    Details: Previous notes reviewed  Labs: ordered.    Details: Negative covid and flu. Normal sodium 140 and potassium 4.1 and creatinine.  Troponin normal 7 BNP 106. Elevated white count 14.6, normal hemoglobin.  Normal platelets.   Radiology: ordered and independent interpretation performed.    Details: NO PNA by me  ECG/medicine tests: ordered and independent interpretation performed. Decision-making details documented in ED Course.  Risk Prescription drug management. Decision regarding hospitalization.    Final Clinical Impression(s) / ED Diagnoses Final diagnoses:  COPD exacerbation (HCC)   The patient appears reasonably stabilized for admission considering the current resources, flow, and capabilities available in the ED at this time, and I doubt any other [facility] requiring further screening and/or treatment in the ED prior to admission.  Rx / DC Orders ED Discharge Orders     None         Nolyn Swab, MD 12/04/23 [phone]

## 2023-12-04 NOTE — Progress Notes (Signed)
  Overnight events: None  Subjective: Doing better this morning, on home 2L [place]. Still reports some subjective shortness of breath, but no coughing, and feels she is improving.   Objective:  Vital signs in last 24 hours: Vitals:   12/04/23 0250 12/04/23 0715 12/04/23 0725 12/04/23 0730  BP:  (!) 149/82    Pulse:  81  82  Resp:  14  17  Temp:      TempSrc:   Oral   SpO2: 100% 98%  98%  Weight:      Height:       Physical Exam: Constitutional: Chronically ill appearing female, in no distress Cardio: Regular rate and rhythm, no murmurs appreciated, 1+ pitting edema bilaterally Pulm: Mild diffuse wheezing bilaterally, greater on expiration, poor air movement; satting well on 2L [place]; no respiratory distress Neuro: Alert and oriented x 3, no focal deficits Psych: Anxious and tearful  Labs: Magnesium  1.6 See H&P for admission labs  Assessment/Plan: Principal Problem:   COPD exacerbation (HCC)  Patient Summary: Ms. Mikkelsen is a 68 year old with COPD on 2L [place] at baseline, HFpEF EF 60-65%, HTN, current tobacco use, anxiety, and hypothyroidism who presented with shortness of breath and was admitted to the [facility] for COPD exacerbation on hospital day 1.  COPD exacerbation Back to home baseline 2L [place]. Appears to be breathing comfortably. Still has mild diffuse wheezes on exam. She says she was using her Trelegy daily at home, in addition to her nebulizers.  - Duonebs q4h, singular 10 mg at bedtime - Prednisone  40 mg x 4 days - Breztri  inhaler - Ambulation with pulse ox  Concern for heart failure exacerbation HFpEF, LVEF 60-65% BNP mildly elevated, states she has been taking her home dose of lasix  regularly. 1+ edema in LE. Patient states she weighs herself daily and is up 8 pounds, but per our records her weight has been stable since her last admission. We will diurese today with likely discharge tomorrow.  - IV lasix  this am; restarting home dose tomorrow - strict I's & O's -  Echocardiogram - BMP in am  Tobacco use States she smokes ~0.5 ppd, down from 1 ppd. Used medication assistance in the past and would like to restart this on discharge. Spoke with her about how crucial it is to continue to quit smoking to prevent further progression of her lung disease and recurrence of hospitalization.  - Nicotine  patch - Chantix  at discharge  Hypomagnesemia Magnesium  1.6 this morning, repleted with 2g IV magnesium .  - Recheck mag in am  Diet: Normal VTE: Xarelto  Code: Full   Dispo: Anticipated discharge to  Home likely tomorrow pending improvement of symptoms  Dorthy Gavia, MD 12/04/2023, 1:15 PM After 5pm on weekdays and 1pm on weekends: On Call pager [phone]

## 2023-12-04 NOTE — H&P (Signed)
 Date: 12/04/2023               Patient Name:  [name] [name] MRN: [id number]  DOB: 10/01/1955 Age / Sex: 68 y.o., female   [name]: [name] Presume, MD         Medical Service: Internal Medicine Teaching Service         Attending Physician: Dr. Bevelyn Bryant, MD    First Contact: [name] [name] Pager: [phone]  Second Contact: Dr. Jearldine Mina, DO  Pager: [phone]       After Hours (After 5p/  First Contact Pager: [phone]  weekends / holidays): Second Contact Pager: [phone]   Chief Complaint: Shortness of breath   History of Present Illness:  [name] [name] is a 68 year old with COPD on 2L [place] at baseline, HFpEF EF 60-65%, HTN, current tobacco use, anxiety, and hypothyroidism who presents with shortness of breath. She was recently admitted from 5/13-5/15 with a COPD exacerbation. At discharge she reported feeling well, was on 2L O2 at home via [place]. She can normally walk the length of her house as baseline but will be dyspneic after that. Around midnight she was laying down when she felt much more short of breath so called EMS. On arrival she was not hypoxic but had increased work of breathing so she was placed on bipap.   A TOC phone note 5/19 mentions that her [name] gave her extra furosemide  as she had an 8 lbs weight gain and increased swelling to lower extremities. Patient endorses this as well as her waist band feeling tighter. She has been adherent with home furosemide  and other pill medications, but has not been using her trelegy inhaler stating she forgot about it. Instead she has been using albuterol  nebulizer 2-3 times daily. She denies coughing or increased sputum production. She smokes about 1 PPD and uses 2L of oxygen  at home at all times. Has appointment with pulmonary doctor ([name] [name]) upcoming Monday. [name] is [name] [name] who she sees regularly.    Code Status: Full Code, does not want tracheostomy but would be ok with intubation for short time Surrogate decision  maker: [name] [name]   ED course:  Arrived on Bipap, no documentation that she was ever hypoxic. HR and BP within normal limits. Covid, flu negative. CXR without signs of congestion. WBC 14.6, BNP 106 (elevated, 84 9 days ago), Platelets 484.  Meds:  Alendronate  70 mg Amlodipine  5 mg Asa 81 mg Atorvastatin  80 mg Buspar  30 mg AM, 15 mg at lunch and evening Clonazepam  0.5 mg TID PRN Doxepin  75 mg Escitalopram  20 mg every day Ezetemibie 10 mg Furosemide  20 mg every day Gabapentin  600 mg QID Levothyroxine  112 mcg Losartan  50 mg every day Metformin  500 mg Mirtazapine  45 mg at bedtime Singulair  10 mg at bedtime Nebivolol  10 mg every day Omeprazole  40 mg bid Brilinta  60 mg bid Trelegy - has not taken since hospital discharge  Allergies: Allergies as of 12/04/2023 - Review Complete 12/04/2023  Allergen Reaction Noted   Avocado Anaphylaxis 09/15/2017   Latex Shortness Of Breath and Rash 12/22/2008   Codeine  Nausea Only 12/22/2008   Past Medical History:  Diagnosis Date   Allergy    seasonal   Anemia    Anxiety    takes Lexapro  daily   Arthritis    "back, from neck down pass my bra area" (03/25/2017)   Asthma    Bartholin gland cyst 08/29/2011   Bruises easily  pt is on Effient    Chronic back pain    herniated nucleus pulposus   Chronic back pain    "neck to bra area; lower back" (03/25/2017)   Chronic kidney disease    recurrent UTI's this year 2022   COPD (chronic obstructive pulmonary disease) (HCC)    early stages   Coronary artery disease    Depression    takes Klonopin  daily   Diabetes mellitus without complication (HCC)    Diverticulosis    Fibroadenoma of left breast    GERD (gastroesophageal reflux disease)    takes Nexium  daily   H/O hiatal hernia    Heart attack (HCC) 2011   Hemorrhoids    Hernia    Hyperlipidemia    takes Lipitor  daily   Hypertension    takes Losartan  daily and Labetalol  bid   Hypothyroidism    takes Synthroid  daily    Insomnia    hydroxyzine  prn   Joint pain    Pneumonia    "couple times" (03/25/2017)   Pre-diabetes    "just found out 1 wk ago" (03/25/2017)   Psoriasis    elbows,knees,back   Shortness of breath    with exertion   Slowing of urinary stream    Stress incontinence    Family History:  Family History  Problem Relation Age of Onset   Heart disease Mother    Hypertension Mother    Stroke Mother    Mental illness Mother    Heart disease Father    Hypertension Father    Diabetes Father    Colon polyps Brother    Heart disease Maternal Grandfather    Cancer Paternal Grandmother        mouth   Anesthesia problems [name]    Breast cancer Maternal Aunt    Throat cancer Maternal Uncle    Thyroid  disease Paternal Aunt    Hypotension Neg Hx    Malignant hyperthermia Neg Hx    Pseudochol deficiency Neg Hx    Colon cancer Neg Hx    Stomach cancer Neg Hx    Esophageal cancer Neg Hx    Pancreatic cancer Neg Hx    Rectal cancer Neg Hx     Social History:  - Lives at home - Can complete some ADLs independently, [name] helps her with groceries/driving - Patient smokes a pack of cigarettes a day  - [name] Concetta Dee, MD   Review of Systems: A complete ROS was negative except as per HPI.   Physical Exam: Blood pressure (!) 93/59, pulse 87, temperature 98 F (36.7 C), temperature source Axillary, resp. rate 18, height 4\' 9"  (1.448 m), weight 69 kg, SpO2 100%. General: Patient initially wearing BiPAP, able to switch to [place] without issue, lying in bed in no acute distress HENT: normocephalic, atraumatic Cardiovascular: regular rate and rhythm, no r/m/g, no JVD appreciated Pulmonary/Chest: Diffuse wheezing, poor airflow throughout both lungs, normal work of breathing on 2L via [place]  Abdominal: soft, non-tender, non-distended Neurological: alert and oriented, seemed at her baseline MSK: 1+ pitting edema bilateral ankles, moving all limbs spontaneously  Skin: warm and dry  Psych:  normal mood and affect     Latest Ref Rng & Units 12/04/2023    1:49 AM 12/04/2023   12:45 AM 11/26/2023    6:24 AM  CBC  WBC 4.0 - 10.5 K/uL  14.6  8.5   Hemoglobin 12.0 - 15.0 g/dL 16.1  09.6  04.5   Hematocrit 36.0 - 46.0 % 37.0  41.1  38.1   Platelets 150 - 400 K/uL  484  402       Latest Ref Rng & Units 12/04/2023    1:49 AM 12/04/2023   12:45 AM 11/26/2023    6:24 AM  BMP  Glucose 70 - 99 mg/dL  272  536   BUN 8 - 23 mg/dL  9  7   Creatinine 6.44 - 1.00 mg/dL  0.34  7.42   Sodium 595 - 145 mmol/L 139  140  140   Potassium 3.5 - 5.1 mmol/L 3.7  4.1  3.8   Chloride 98 - 111 mmol/L  103  103   CO2 22 - 32 mmol/L  25  25   Calcium  8.9 - 10.3 mg/dL  9.7  9.3     Respiratory panel: negative BNP: 106.9 (84.1 9 days ago) Troponin: 7  Mg: needs to be collected I-Stat arterial blood gas unremarkable  EKG: personally reviewed my interpretation is sinus rhythm, HR 77 BMP, QTc 463 ms. Similar to EKG from 5/13.   CXR: personally reviewed my interpretation is clear lungs, normal heart size.   Assessment & Plan by Problem: Principal Problem:   COPD exacerbation (HCC)  Ms. Hottle is a 68 year old with COPD on 2L [place] at baseline, HFpEF EF 60-65%, HTN, current tobacco use, anxiety, and hypothyroidism who presented with shortness of breath and was admitted to the [facility] for COPD exacerbation.  COPD exacerbation Patient recently discharged for COPD exacerbation on 2L O2 via [place] with a plan to complete PO prednisone  and azithromycin , outpatient follow up with her pulmonologist, and continuation of maintenance and rescue inhalers. However, patient states she did not use her maintenance inhaler (Trelegy) as she forgot about it. Only used albuterol  nebulizer 2-3 times a day as needed. Given increased shortness of breath and cough will treat as recurring COPD exacerbation. Respiratory panel negative. Successfully taken off BiPAP and transitioned to home O2 2L via [place]  in ED. Given 1x dose of azithromycin   in ED and 1 x dose of solu-medrol  after admission for additional symptom relief. Low concern for severe exacerbation given back to baseline O2, will not give additional doses of antibiotic at this time. Patient will be at ongoing risk of exacerbations due to active tobacco use and non-adherence to maintenance inhaler. Will need counseling and patient education to help prevent future episodes.  Plan - Duonebs q4H - Singular 10 mg daily at bedtime - prednisone  40 mg daily x 4 days  - 2L O2 via [place] with oxygen  parameters - If patient remains in hospital can order Breztri  as alternative to home Trelegy inhaler, could also have [name] bring it in - Ambulatory pulse ox prior to discharge  Concern for heart failure exacerbation HFpEF, LVEF 60-65% Follows with Dr. Magnus Schuller. Last echo 12/204 with normal LV systolic and diastolic function. Patient endorses 8 lb weight increase over the last week or so, has not changed eating habits. Feels like abdomen is bloated. 1+ pitting edema around bilateral ankles on exam. Difficult to appreciate JVD. BNP was elevated above baseline. Per patient, has been more dyspneic than baseline. Home regimen includes lasix  20 mg daily, amlodipine  5 mg dialy, losartan  75 mg daily, and nebivolol  10 mg daily. Per chart review, patient's [name] called case manager on 5/19 due to concerns for weight increase, gave her mother an extra dose of lasix . Given that patient appears volume overloaded on exam and is symptomatic will give IV lasix .  Plan - IV lasix  40 mg  x 1 dose - Daily weighs, monitor I/Os - Re-evaluate volume status after lasix , consider additional dose - Monitor Cr on BMP - Consider repeat echo (inpatient/outpatient depending on patient's overall symptoms)  Chronic medical conditions  Hypertension Patient's blood pressures here 90s-110s/50s-80s. Plan to resume home medications including amlodipine  5 mg daily, losartan  75 mg daily, and nebivolol  10 mg daily on  5/22. Will need to monitor blood pressures, consider holding if blood pressures are too low or patient becomes symptomatic once she is up and moving around.   Generalized anxiety disorder Insomnia Home regimen includes Klonopin  0.5 mg TID PRN, Remeron  45 mg nightly, Buspar  30 mg AM, 15 mg at lunch and evening, doxepin  75 mg nightly, and escitalopram  20 mg in the morning. Polypharmacy puts patient at high risk of falls. Will resume home meds with exception of Klonopin  which is PRN.   Hypothyroidism TSH 1 month ago 11.1. Patient's current home regimen of levothyroxine  112 mcg, resumed here. Recommend outpatient follow up.    CAD s/p LAD stent with evidence of rethrombosis  History of stenting to RCA in 2001 with PCI to diagonal artery. Developed in-stent restenosis to her RCA. Resumed home aspirin  81 mg daily, Brilinta  60 mg BID, lipitor  80 mg daily, and zetia  10 mg daily.     Tobacco use Patient is smoking 1 pack a day. Continue smoking cessation counseling. Can offer nicotine  patch while patient is here.   DVT prophylaxis: Xarelto  10 mg  Diet: Regular Code: Full Code  Disposition prior to admission: home Anticipated discharge location: home Barriers to discharge: COPD treatment  Dispo: Admit patient to Observation with expected length of stay less than 2 midnights.  Signed: Arellano Zameza, Nusayba Cadenas, MD 12/04/2023, 6:25 AM  After 5pm on weekdays and 1pm on weekends: On Call pager: [phone]

## 2023-12-04 NOTE — Care Management Obs Status (Signed)
 MEDICARE OBSERVATION STATUS NOTIFICATION   Patient Details  Name: [name] [name] MRN: [id number] Date of Birth: 1955-10-22   Medicare Observation Status Notification Given:  Yes    Joanette Moynahan, RN 12/04/2023, 9:32 AM

## 2023-12-04 NOTE — Hospital Course (Addendum)
 COPD exacerbation Tobacco use Recently admitted for the same problem 5/13-5/15.  Presented with increased shortness of breath and dry cough and was admitted for COPD exacerbation.  Infectious workup was negative.  She was put on BiPAP in the emergency department and quickly transitioned to her home 2 L nasal cannula.  She received 1 dose of azithromycin  in the emergency department, and 1 dose of Solu-Medrol .  She was treated with DuoNebs, Singulair , and prednisone , with improvement of her symptoms.  She was able to ambulate on 2L [place] with her oxygen  saturation above 90% with physical therapy.  There is some confusion about whether she is using her new Trelegy inhaler.  She continues to smoke daily, which is likely the main trigger for her recurrent symptoms and repeated hospitalizations.  We spoke with her extensively about the importance of smoking cessation and she seems to have the intention to stop smoking, so we have prescribed her Chantix  to take at discharge.  She has an outpatient follow-up appointment with pulmonology next week.  Concern for heart failure exacerbation HFpEF, LVEF 60-65% Previous echocardiogram in December showed an ejection fraction of 60-65% without focal abnormalities.  She weighs herself daily and states that subjectively she had an 8 pound weight gain, however our records show that her weight has been about stable since her prior admission.  She has not missed any home doses of her Lasix  dose.  She did have 1+ pitting edema in her lower extremities, and an elevated BNP, so she was diuresed with 40 mg IV Lasix  and her symptoms improved.  Repeat echocardiogram was collected which was stable from her prior echocardiogram with an EF of 65-70%, no regional wall abnormalities, no valvular disease or focal deficits. She was discharged with instructions to double up her Lasix  dose for the next 3 days, then return to her once daily dose until she follows up with her PCP. Standing weight on  date of discharge was 154 lbs.

## 2023-12-05 ENCOUNTER — Other Ambulatory Visit (HOSPITAL_COMMUNITY): Payer: Self-pay

## 2023-12-05 ENCOUNTER — Other Ambulatory Visit: Payer: Self-pay

## 2023-12-05 ENCOUNTER — Observation Stay (HOSPITAL_COMMUNITY)

## 2023-12-05 DIAGNOSIS — Z9189 Other specified personal risk factors, not elsewhere classified: Secondary | ICD-10-CM

## 2023-12-05 DIAGNOSIS — I5033 Acute on chronic diastolic (congestive) heart failure: Secondary | ICD-10-CM

## 2023-12-05 DIAGNOSIS — J441 Chronic obstructive pulmonary disease with (acute) exacerbation: Secondary | ICD-10-CM | POA: Diagnosis not present

## 2023-12-05 LAB — BASIC METABOLIC PANEL WITH GFR
Anion gap: 5 (ref 5–15)
BUN: 17 mg/dL (ref 8–23)
CO2: 25 mmol/L (ref 22–32)
Calcium: 8.7 mg/dL — ABNORMAL LOW (ref 8.9–10.3)
Chloride: 105 mmol/L (ref 98–111)
Creatinine, Ser: 0.87 mg/dL (ref 0.44–1.00)
GFR, Estimated: >60 mL/min (ref >60)
Glucose, Bld: 155 mg/dL — ABNORMAL HIGH (ref 70–99)
Potassium: 3.9 mmol/L (ref 3.5–5.1)
Sodium: 135 mmol/L (ref 135–145)

## 2023-12-05 LAB — CBC
HCT: 34.7 % — ABNORMAL LOW (ref 36.0–46.0)
Hemoglobin: 11.3 g/dL — ABNORMAL LOW (ref 12.0–15.0)
MCH: 31.3 pg (ref 26.0–34.0)
MCHC: 32.6 g/dL (ref 30.0–36.0)
MCV: 96.1 fL (ref 80.0–100.0)
Platelets: 463 10*3/uL — ABNORMAL HIGH (ref 150–400)
RBC: 3.61 MIL/uL — ABNORMAL LOW (ref 3.87–5.11)
RDW: 15 % (ref 11.5–15.5)
WBC: 15.9 10*3/uL — ABNORMAL HIGH (ref 4.0–10.5)
nRBC: 0 % (ref 0.0–0.2)

## 2023-12-05 LAB — ECHOCARDIOGRAM COMPLETE
Area-P 1/2: 4.8 cm2
Height: 57 in
S' Lateral: 2.8 cm
Weight: 2474.44 [oz_av]

## 2023-12-05 LAB — BRAIN NATRIURETIC PEPTIDE: B Natriuretic Peptide: 72.9 pg/mL (ref 0.0–100.0)

## 2023-12-05 LAB — MAGNESIUM: Magnesium: 1.8 mg/dL (ref 1.7–2.4)

## 2023-12-05 MED ORDER — GABAPENTIN 300 MG PO CAPS
600.0000 mg | ORAL_CAPSULE | Freq: Two times a day (BID) | ORAL | Status: DC
  Administered 2023-12-05: 600 mg via ORAL
  Filled 2023-12-05: qty 2

## 2023-12-05 MED ORDER — VARENICLINE TARTRATE 1 MG PO TABS
1.0000 mg | ORAL_TABLET | Freq: Two times a day (BID) | ORAL | 0 refills | Status: DC
Start: 2023-12-05 — End: 2023-12-05
  Filled 2023-12-05: qty 60, 30d supply, fill #0

## 2023-12-05 MED ORDER — VARENICLINE TARTRATE 1 MG PO TABS: 1.0000 mg | ORAL_TABLET | Freq: Two times a day (BID) | ORAL | 0 refills | Status: DC

## 2023-12-05 MED ORDER — VARENICLINE TARTRATE (STARTER) 0.5 MG X 11 & 1 MG X 42 PO TBPK
ORAL_TABLET | ORAL | 0 refills | Status: DC
  Filled 2023-12-05: qty 53, fill #0

## 2023-12-05 MED ORDER — VARENICLINE TARTRATE (STARTER) 0.5 MG X 11 & 1 MG X 42 PO TBPK: ORAL_TABLET | ORAL | 0 refills | Status: DC

## 2023-12-05 NOTE — TOC Progression Note (Addendum)
 Transition of Care [facility]) - Progression Note    Patient Details  Name: [name] [name] MRN: [id number] Date of Birth: 1955/09/12  [facility] CM/SW Contact  Ronni Colace, RN Phone Number: 12/05/2023, 8:42 AM  Clinical Narrative:     Patient has had [facility] prior for [facility] and she has expressed interest in having home health again. Reached out to [facility] to accept again, unfortunately they cannot accept at this time Will see if another agency can accept [facility] will accept      Expected Discharge Plan and Services      Home with home health                                         Social Determinants of Health (SDOH) Interventions SDOH Screenings   Food Insecurity: No Food Insecurity (12/04/2023)  Housing: Low Risk  (12/04/2023)  Recent Concern: Housing - High Risk (10/05/2023)  Transportation Needs: No Transportation Needs (12/04/2023)  Utilities: Not At Risk (12/04/2023)  Alcohol Screen: Low Risk  (08/07/2023)  Depression (PHQ2-9): Low Risk  (11/14/2023)  Recent Concern: Depression (PHQ2-9) - Medium Risk (11/11/2023)  Financial Resource Strain: Medium Risk (08/07/2023)  Physical Activity: Inactive (08/07/2023)  Social Connections: Moderately Isolated (12/04/2023)  Stress: No Stress Concern Present (08/07/2023)  Tobacco Use: High Risk (12/04/2023)  Health Literacy: Inadequate Health Literacy (08/07/2023)    Readmission Risk Interventions    06/20/2023    3:17 PM  Readmission Risk Prevention Plan  Transportation Screening Complete  HRI or Home Care Consult Complete  Social Work Consult for Recovery Care Planning/Counseling Complete  Palliative Care Screening Not Applicable  Medication Review [employer]) Complete

## 2023-12-05 NOTE — Plan of Care (Signed)

## 2023-12-05 NOTE — Progress Notes (Signed)
  Echocardiogram 2D Echocardiogram has been performed.  [name] [name] 12/05/2023, 1:48 PM

## 2023-12-05 NOTE — Discharge Summary (Addendum)
 Name: [name] [name] MRN: [id number] DOB: [birth year] 68 y.o. PCP: Lawrance Presume, MD  Date of Admission: 12/04/2023 12:41 AM Date of Discharge:  12/05/2023 Attending Physician: Dr. Ancil Balzarine  DISCHARGE DIAGNOSIS:  Primary Problem: COPD exacerbation [facility])   Hospital Problems: Principal Problem:   COPD exacerbation (HCC)   DISCHARGE MEDICATIONS:   Allergies as of 12/05/2023       Reactions   Avocado Anaphylaxis   Latex Shortness Of Breath, Rash   Codeine  Nausea Only        Medication List     STOP taking these medications    nicotine  14 mg/24hr patch Commonly known as: NICODERM CQ  - dosed in mg/24 hours       TAKE these medications    acetaminophen  500 MG tablet Commonly known as: TYLENOL  Take 2 tablets (1,000 mg total) by mouth every 6 (six) hours. What changed: when to take this   alendronate  70 MG tablet Commonly known as: FOSAMAX  Take 1 tablet (70 mg total) by mouth every 7 (seven) days. Take with a full glass of water on an empty stomach.   amLODipine  5 MG tablet Commonly known as: NORVASC  Take 1 tablet (5 mg total) by mouth daily. What changed: when to take this   aspirin  EC 81 MG tablet Take 81 mg by mouth in the morning.   atorvastatin  80 MG tablet Commonly known as: LIPITOR  Take 1 tablet (80 mg total) by mouth daily. What changed: when to take this   Brilinta  60 MG Tabs tablet Generic drug: ticagrelor  Take 1 tablet (60 mg total) by mouth 2 (two) times daily.   busPIRone  15 MG tablet Commonly known as: BUSPAR  Take 2 tablets (30 mg total) by mouth in the morning AND 1 tablet (15 mg total) daily at 12 noon AND 1 tablet (15 mg total) every evening.   clonazePAM  0.5 MG tablet Commonly known as: KlonoPIN  Take 1 tablet (0.5 mg total) by mouth 3 (three) times daily as needed for anxiety. What changed: when to take this   docusate sodium  100 MG capsule Commonly known as: COLACE Take 1 capsule (100 mg total) by mouth 2 (two) times daily as needed  for mild constipation.   doxepin  75 MG capsule Commonly known as: SINEQUAN  Take 1 capsule (75 mg total) by mouth at bedtime.   escitalopram  20 MG tablet Commonly known as: LEXAPRO  Take 1 tablet (20 mg total) by mouth daily.   ezetimibe  10 MG tablet Commonly known as: ZETIA  Take 1 tablet (10 mg total) by mouth daily. What changed: when to take this   furosemide  20 MG tablet Commonly known as: LASIX  Take 1 tablet (20 mg total) by mouth daily. What changed: when to take this   gabapentin  600 MG tablet Commonly known as: NEURONTIN  Take 1 tablet (600 mg total) by mouth 2 (two) times daily.   guaiFENesin  600 MG 12 hr tablet Commonly known as: MUCINEX  Take 1 tablet (600 mg total) by mouth 2 (two) times daily as needed for up to 8 doses. Take for cough/phlegm.   ipratropium-albuterol  0.5-2.5 (3) MG/3ML Soln Commonly known as: DUONEB Take 3 mLs by nebulization every 8 (eight) hours.   levothyroxine  112 MCG tablet Commonly known as: SYNTHROID  Take 1 tablet (112 mcg total) by mouth daily. What changed: when to take this   losartan  50 MG tablet Commonly known as: COZAAR  Take 1.5 tablets (75 mg total) by mouth daily.   metFORMIN  500 MG tablet Commonly known as: GLUCOPHAGE  Take 1 tablet (500  mg total) by mouth 2 (two) times daily with a meal.   mirtazapine  45 MG tablet Commonly known as: REMERON  Take 1 tablet (45 mg total) by mouth at bedtime.   montelukast  10 MG tablet Commonly known as: SINGULAIR  Take 1 tablet (10 mg total) by mouth at bedtime.   nebivolol  10 MG tablet Commonly known as: Bystolic  Take 1 tablet (10 mg total) by mouth daily. What changed: when to take this   nitroGLYCERIN  0.4 MG SL tablet Commonly known as: NITROSTAT  Place 1 tablet (0.4 mg total) under the tongue every 5 (five) minutes as needed for chest pain.   omeprazole  40 MG capsule Commonly known as: PRILOSEC Take 1 capsule (40 mg total) by mouth 2 (two) times daily before a meal.    ondansetron  4 MG disintegrating tablet Commonly known as: ZOFRAN -ODT Take 1 tablet (4 mg total) by mouth every 8 (eight) hours as needed for nausea or vomiting.   OXYGEN  Inhale 2 L/min into the lungs continuous.   polyethylene glycol 17 g packet Commonly known as: MIRALAX  / GLYCOLAX  Take 17 g by mouth daily as needed for severe constipation.   predniSONE  20 MG tablet Commonly known as: DELTASONE  Take 2 tablets (40 mg total) by mouth daily with breakfast.   Trelegy Ellipta  200-62.5-25 MCG/ACT Aepb Generic drug: Fluticasone -Umeclidin-Vilant Inhale 1 puff into the lungs daily. What changed: when to take this   varenicline  1 MG tablet Commonly known as: Chantix  Continuing Month Pak Take 1 tablet (1 mg total) by mouth 2 (two) times daily.   Varenicline  Tartrate (Starter) 0.5 MG X 11 & 1 MG X 42 Tbpk Commonly known as: Chantix  Starting Month Pak Please follow the instructions listed on the starter pack, followed by the continuing month pack.   Ventolin  HFA 108 (90 Base) MCG/ACT inhaler Generic drug: albuterol  INHALE 2 PUFFS into THE lungs EVERY 6 HOURS AS NEEDED wheezing AND SHORTNESS OF BREATH What changed: See the new instructions.         DISPOSITION AND FOLLOW-UP:  [name] [name] was discharged from [facility] in [place] condition. At the hospital follow up visit please address:  COPD exacerbation: Discharged on baseline 2 L nasal cannula.  Ensure she is taking her Trelegy inhaler as prescribed and completes her remaining 2 days of prednisone .  Ensure she follows up with pulmonology.  HFpEF (60-65%): Repeat echocardiogram was stable from prior in December. No focal abnormalities.  Symptoms improved following diuresis. Instructed patient to double lasix  dose for 1 day, then to return to her home daily dose. Recommend reassessing volume status outpatient and considering modifying Lasix  dosing as appropriate.  Polypharmacy: She is on quite a few medications,  including several centrally acting medications. Recommend reassessing need for these medications outpatient. Home health aide order placed for assistance with medication management.   Tobacco use: Discharged with Chantix . Follow up on this and continue to encourage cessation.   Follow-up Appointments:  Follow-up Information     Lawrance Presume, MD. Go on 12/11/2023.   Specialty: Internal Medicine Why: 3:50 PM Contact information: [address] [place] [address] [place] [address] [phone]         Wilfredo Hanly, MD. Go on 12/09/2023.   Specialty: Pulmonary Disease Why: 3:00 PM Contact information: [address] [place] [address] [phone]         [facility] Follow up.   Specialty: Home Health Services Contact information: [address] [place]  [address] [phone]                 HOSPITAL COURSE:  Patient Summary: COPD exacerbation Tobacco use Recently admitted for the same problem 5/13-5/15.  Presented with increased shortness of breath and dry cough and was admitted for COPD exacerbation.  Infectious workup was negative.  She was put on BiPAP in the emergency department and quickly transitioned to her home 2 L nasal cannula.  She received 1 dose of azithromycin  in the emergency department, and 1 dose of Solu-Medrol .  She was treated with DuoNebs, Singulair , and prednisone , with improvement of her symptoms.  She was able to ambulate on 2L [place] with her oxygen  saturation above 90% with physical therapy.  There is some confusion about whether she is using her new Trelegy inhaler.  She continues to smoke daily, which is likely the main trigger for her recurrent symptoms and repeated hospitalizations.  We spoke with her extensively about the importance of smoking cessation and she seems to have the intention to stop smoking, so we have prescribed her Chantix  to take at discharge.  She has an outpatient follow-up  appointment with pulmonology next week.  Concern for heart failure exacerbation HFpEF, LVEF 60-65% Previous echocardiogram in December showed an ejection fraction of 60-65% without focal abnormalities.  She weighs herself daily and states that subjectively she had an 8 pound weight gain, however our records show that her weight has been about stable since her prior admission.  She has not missed any home doses of her Lasix  dose.  She did have 1+ pitting edema in her lower extremities, and an elevated BNP, so she was diuresed with 40 mg IV Lasix  and her symptoms improved.  Repeat echocardiogram was collected which was stable from her prior echocardiogram with an EF of 65-70%, no regional wall abnormalities, no valvular disease or focal deficits. She was discharged with instructions to double up her Lasix  dose for the next day, then return to her once daily dose until she follows up with her PCP. Standing weight on date of discharge was 154 lbs.     DISCHARGE INSTRUCTIONS:   Discharge Instructions     Call MD for:  difficulty breathing, headache or visual disturbances   Complete by: As directed    Call MD for:  extreme fatigue   Complete by: As directed    Call MD for:  persistant dizziness or light-headedness   Complete by: As directed    Call MD for:  severe uncontrolled pain   Complete by: As directed    Call MD for:  temperature >100.4   Complete by: As directed    Discharge instructions   Complete by: As directed    You were hospitalized for a COPD exacerbation.  You are now back to your baseline home 2 L oxygen  via nasal cannula.  Thank you for allowing us  to be part of your care.   Please get your follow-up appointments:  Pulmonology, on 12/09/2023 at 3:00 PM Lawrance Presume, MD on 12/11/2023 at 3:50 PM  Please note these changes made to your medications:   Please START taking:   Prednisone , 40 mg (2 tablets) daily with breakfast for the next two days  Chantix  for smoking  cessation, per medication instructions  Trelegy inhaler, as prescribed  Please take your home Lasix  dose (20 mg) TWICE tomorrow. After that, return to taking it once a day until your follow up with your primary care doctor. Please check your blood pressure at  home, and if it is low, please stop this medication. If you feel dizzy, I also recommend stopping this medication and following up with your PCP.   Please make sure to take your medications as prescribed and follow-up with the outpatient doctors as listed above.  If you have any sudden worsening of breath, fever or chills, chest pain, coughing with phlegm production, or other concerning symptoms, please return to the emergency department for further evaluation.  Otherwise, please follow-up with your outpatient doctors.   Face-to-face encounter (required for Medicare/Medicaid patients)   Complete by: As directed    I Dorthy Gavia certify that this patient is under my care and that I, or a nurse practitioner or physician's assistant working with me, had a face-to-face encounter that meets the physician face-to-face encounter requirements with this patient on 12/05/2023. The encounter with the patient was in whole, or in part for the following medical condition(s) which is the primary reason for home health care (List medical condition): unable to safely manage medications, recurrent hospitalizations as a result of poor medication adherence   The encounter with the patient was in whole, or in part, for the following medical condition, which is the primary reason for home health care: Medication management   I certify that, based on my findings, the following services are medically necessary home health services: Nursing   Reason for Medically Necessary Home Health Services: Skilled Nursing- Changes in Medication/Medication Management   My clinical findings support the need for the above services: OTHER SEE COMMENTS   Further, I certify that my  clinical findings support that this patient is homebound due to: Shortness of Breath with activity   Home Health   Complete by: As directed    To provide the following care/treatments: Home Health Aide   Increase activity slowly   Complete by: As directed        SUBJECTIVE:   Feels back to her baseline. States she was able to get up and walk around without shortness of breath beyond her normal. She feels ready to go home. Spoke with her about the importance of medication adherence and tobacco cessation. She is requesting a home health aide for assistance with medication management.   Discharge Vitals:   BP (!) 105/50 (BP Location: Right Arm)   Pulse 70   Temp 98.8 F (37.1 C) (Oral)   Resp 18   Ht 4\' 9"  (1.448 m)   Wt 70.1 kg   SpO2 97%   BMI 33.47 kg/m   OBJECTIVE:  Physical Exam Constitutional:      Appearance: Normal appearance.  HENT:     Mouth/Throat:     Mouth: Mucous membranes are moist.  Cardiovascular:     Rate and Rhythm: Normal rate and regular rhythm.     Pulses: Normal pulses.     Heart sounds: Normal heart sounds.  Pulmonary:     Effort: Pulmonary effort is normal.     Breath sounds: Wheezing present.     Comments: Diffuse wheezes bilaterally, no respiratory distress or increased effort, satting well on 2L [place] Abdominal:     General: Abdomen is flat.  Musculoskeletal:        General: Normal range of motion.  Skin:    General: Skin is warm and dry.  Neurological:     General: No focal deficit present.     Mental Status: She is alert and oriented to person, place, and time.  Psychiatric:        Mood and  Affect: Mood normal.        Behavior: Behavior normal.     Pertinent Labs, Studies, and Procedures:     Latest Ref Rng & Units 12/05/2023    5:47 AM 12/04/2023    1:49 AM 12/04/2023   12:45 AM  CBC  WBC 4.0 - 10.5 K/uL 15.9   14.6   Hemoglobin 12.0 - 15.0 g/dL 40.9  81.1  91.4   Hematocrit 36.0 - 46.0 % 34.7  37.0  41.1   Platelets 150 - 400  K/uL 463   484       Latest Ref Rng & Units 12/05/2023    5:47 AM 12/04/2023    1:49 AM 12/04/2023   12:45 AM  CMP  Glucose 70 - 99 mg/dL 782   956   BUN 8 - 23 mg/dL 17   9   Creatinine 2.13 - 1.00 mg/dL 0.86   5.78   Sodium 469 - 145 mmol/L 135  139  140   Potassium 3.5 - 5.1 mmol/L 3.9  3.7  4.1   Chloride 98 - 111 mmol/L 105   103   CO2 22 - 32 mmol/L 25   25   Calcium  8.9 - 10.3 mg/dL 8.7   9.7     DG Chest Portable 1 View Result Date: 12/04/2023 CLINICAL DATA:  Dyspnea EXAM: PORTABLE CHEST 1 VIEW COMPARISON:  None Available. FINDINGS: The heart size and mediastinal contours are within normal limits. Both lungs are clear. The visualized skeletal structures are unremarkable. IMPRESSION: No active disease. Electronically Signed   By: Worthy Heads M.D.   On: 12/04/2023 01:17     Signed: Dorthy Gavia, MD Internal Medicine Resident, PGY-1 [facility]  2:54 PM, 12/05/2023

## 2023-12-07 DIAGNOSIS — I5033 Acute on chronic diastolic (congestive) heart failure: Secondary | ICD-10-CM | POA: Diagnosis not present

## 2023-12-07 DIAGNOSIS — J441 Chronic obstructive pulmonary disease with (acute) exacerbation: Secondary | ICD-10-CM | POA: Diagnosis not present

## 2023-12-07 DIAGNOSIS — I130 Hypertensive heart and chronic kidney disease with heart failure and stage 1 through stage 4 chronic kidney disease, or unspecified chronic kidney disease: Secondary | ICD-10-CM | POA: Diagnosis not present

## 2023-12-07 DIAGNOSIS — F411 Generalized anxiety disorder: Secondary | ICD-10-CM | POA: Diagnosis not present

## 2023-12-07 DIAGNOSIS — E1122 Type 2 diabetes mellitus with diabetic chronic kidney disease: Secondary | ICD-10-CM | POA: Diagnosis not present

## 2023-12-07 DIAGNOSIS — F32A Depression, unspecified: Secondary | ICD-10-CM | POA: Diagnosis not present

## 2023-12-07 DIAGNOSIS — D649 Anemia, unspecified: Secondary | ICD-10-CM | POA: Diagnosis not present

## 2023-12-07 DIAGNOSIS — N189 Chronic kidney disease, unspecified: Secondary | ICD-10-CM | POA: Diagnosis not present

## 2023-12-07 DIAGNOSIS — I2510 Atherosclerotic heart disease of native coronary artery without angina pectoris: Secondary | ICD-10-CM | POA: Diagnosis not present

## 2023-12-09 ENCOUNTER — Encounter: Payer: Self-pay | Admitting: Pulmonary Disease

## 2023-12-09 ENCOUNTER — Ambulatory Visit: Admitting: Pulmonary Disease

## 2023-12-09 ENCOUNTER — Telehealth: Payer: Self-pay

## 2023-12-09 ENCOUNTER — Telehealth: Payer: Self-pay | Admitting: Internal Medicine

## 2023-12-09 VITALS — BP 99/65 | HR 65 | Ht <= 58 in | Wt 157.2 lb

## 2023-12-09 DIAGNOSIS — I509 Heart failure, unspecified: Secondary | ICD-10-CM | POA: Diagnosis not present

## 2023-12-09 DIAGNOSIS — J441 Chronic obstructive pulmonary disease with (acute) exacerbation: Secondary | ICD-10-CM

## 2023-12-09 DIAGNOSIS — F411 Generalized anxiety disorder: Secondary | ICD-10-CM | POA: Diagnosis not present

## 2023-12-09 DIAGNOSIS — Z9981 Dependence on supplemental oxygen: Secondary | ICD-10-CM | POA: Diagnosis not present

## 2023-12-09 DIAGNOSIS — J9611 Chronic respiratory failure with hypoxia: Secondary | ICD-10-CM

## 2023-12-09 DIAGNOSIS — D649 Anemia, unspecified: Secondary | ICD-10-CM | POA: Diagnosis not present

## 2023-12-09 DIAGNOSIS — F32A Depression, unspecified: Secondary | ICD-10-CM | POA: Diagnosis not present

## 2023-12-09 DIAGNOSIS — N189 Chronic kidney disease, unspecified: Secondary | ICD-10-CM | POA: Diagnosis not present

## 2023-12-09 DIAGNOSIS — F17210 Nicotine dependence, cigarettes, uncomplicated: Secondary | ICD-10-CM

## 2023-12-09 DIAGNOSIS — I5033 Acute on chronic diastolic (congestive) heart failure: Secondary | ICD-10-CM | POA: Diagnosis not present

## 2023-12-09 DIAGNOSIS — E1122 Type 2 diabetes mellitus with diabetic chronic kidney disease: Secondary | ICD-10-CM | POA: Diagnosis not present

## 2023-12-09 DIAGNOSIS — I2510 Atherosclerotic heart disease of native coronary artery without angina pectoris: Secondary | ICD-10-CM | POA: Diagnosis not present

## 2023-12-09 DIAGNOSIS — I130 Hypertensive heart and chronic kidney disease with heart failure and stage 1 through stage 4 chronic kidney disease, or unspecified chronic kidney disease: Secondary | ICD-10-CM | POA: Diagnosis not present

## 2023-12-09 NOTE — Telephone Encounter (Signed)
 Copied from CRM [id number]. Topic: Clinical - Home Health Verbal Orders >> Dec 09, 2023  8:35 AM Hassie Lint wrote:  Caller/Agency: [facility] Callback Number: [phone] Service Requested: Physical Therapy Frequency: 2 times a week for 1 week, 1 time a week for 5 weeks Any new concerns about the patient? No

## 2023-12-09 NOTE — Patient Instructions (Addendum)
 We will order you a portable oxygen  concentrator today to be used at 4L pulsed  Continue trelegy ellipta  1 puff daily   Continue albuterol  inhaler 1-2 puffs every 4-6 hours as needed  Hold Losartan  and Amlodipine  for Wednesday and Thursday Take 40mg  of lasix  tomorrow and Thursday for the lower extremity swelling Discuss with primary at follow up visit and call your cardiologist  Start chantix  starter pack, if you would like to continue on this medication please reach out to us  for a prescription  Use nicotine  patches 14mg  daily  Use mini nicotine  lozenges as needed  Follow up in 3 months

## 2023-12-09 NOTE — Telephone Encounter (Signed)
 Called but no answer. LVM giving verbal PT orders.

## 2023-12-09 NOTE — Progress Notes (Addendum)
 Synopsis: Referred in January 2025 for hospital follow up for COPD exacerbation  Subjective:   PATIENT ID: [name] [name] GENDER: female DOB: 06/13/1956, MRN: [id number]  HPI  Chief Complaint  Patient presents with   Follow-up    Coughing, SOB on exertion, wheezing - Productive cough (white)   [name] [name] is a 68 year old woman, daily smoker with history of COPD, DMII, GERD, and CAD who is referred to pulmonary clinic for hospital follow up of COPD exacerbation.  OV 08/07/23 She was admitted 07/12/23 to 07/15/23 and 07/21/2023 to1/02/2024 for COPD exacerbations. She was seen by the inpatient pulmonary team during the December hospitalization. She was parainfluenza positive 06/14/23 and rhinovirus positive 07/23/23.   She  presents with increased mucus production described as thick and yucky. The patient reports difficulty in expectorating the mucus, which is yellow in color. The patient also mentions an increase in wheezing and the need to increase her oxygen  supplementation from 2 to 3 liters when moving around, as her oxygen  saturation drops to the low 70s. The patient's caregiver confirms this, noting that the patient's oxygen  saturation improves to above 88% with the increased oxygen  flow.  The patient continues to smoke about a pack of cigarettes per day, despite previous attempts to quit using Chantix  and nicotine  patches. The patient acknowledges the need to quit but expresses significant anxiety about the prospect, describing a strong craving for cigarettes and a sense of loss without them. The patient also mentions a history of mood disorders and is currently on clonazepam  and an unspecified mood medication.  The patient is currently on Trelegy for COPD management.. The patient also uses Ventolin  and a nebulizer with DuoNeb, though the frequency of use is inconsistent. The patient's caregiver is working on setting up reminders for the patient to use the nebulizer at least twice a  day.  The patient also mentions a recent fall, but no further details or related symptoms are provided. The patient lives alone in a condo and spends most of the day watching TV, with smoking breaks providing most of her physical activity. The patient's caregiver is involved and supportive, helping with medication reminders and attending medical appointments.  The patient has a history of lung cancer and is due for a follow-up scan, which was last done about six months ago. The patient also mentions a history of prednisone  use, but is not currently on it. The patient expresses concern about her memory, which she feels is worsening.  OV 09/29/23 She was admitted 3/5 to 3/13 for fall and pneumothorax. She was admitted to surgical service and chest tube was placed. Pneumothorax has resolved.   She continues to have cough, sputum production and dyspnea.   She has reduced her smoking to one cigarette a day, with her daughter limiting her access to cigarettes. She is trying to reduce her smoking further. She is currently on oxygen  therapy and questions the necessity of wearing it continuously, as she feels she can breathe without it.  She is residing in a rehab facility and undergoing physical therapy.    OV 12/09/23 She experiences bilateral leg swelling with a three-pound weight increase in a day. She was advised to double her Lasix  dose after a recent hospitalization, which initially reduced the swelling, but it has since returned. She takes Lasix  20 mg, amlodipine , losartan , and nebivolol  daily. Her blood pressure is low, with a recent reading of 99/60 mmHg.  She uses CPAP or BiPAP at night and has previously used  a BiPAP during a past hospital stay. She is awaiting a portable oxygen  concentrator. Her oxygen  needs are at the upper limit of her current concentrator's capacity.  She smokes about a pack of cigarettes a day, reduced from over a pack a day. She is preparing to start Chantix  for smoking  cessation and uses nicotine  pouches. She has smoked since age [age] and is concerned about weight gain after quitting.  She completed a course of steroids and antibiotics following her recent hospital discharge. She is not currently on any antibiotics or steroids.  Past Medical History:  Diagnosis Date   Allergy    seasonal   Anemia    Anxiety    takes Lexapro  daily   Arthritis    back, from neck down pass my bra area (03/25/2017)   Asthma    Bartholin gland cyst 08/29/2011   Bruises easily    pt is on Effient    Chronic back pain    herniated nucleus pulposus   Chronic back pain    neck to bra area; lower back (03/25/2017)   Chronic kidney disease    recurrent UTI's this year 2022   COPD (chronic obstructive pulmonary disease) (HCC)    early stages   Coronary artery disease    Depression    takes Klonopin  daily   Diabetes mellitus without complication (HCC)    Diverticulosis    Fibroadenoma of left breast    GERD (gastroesophageal reflux disease)    takes Nexium  daily   H/O hiatal hernia    Heart attack (HCC) 2011   Hemorrhoids    Hernia    Hyperlipidemia    takes Lipitor  daily   Hypertension    takes Losartan  daily and Labetalol  bid   Hypothyroidism    takes Synthroid  daily   Insomnia    hydroxyzine  prn   Joint pain    Pneumonia    couple times (03/25/2017)   Pre-diabetes    just found out 1 wk ago (03/25/2017)   Psoriasis    elbows,knees,back   Shortness of breath    with exertion   Slowing of urinary stream    Stress incontinence      Family History  Problem Relation Age of Onset   Heart disease Mother    Hypertension Mother    Stroke Mother    Mental illness Mother    Heart disease Father    Hypertension Father    Diabetes Father    Colon polyps Brother    Heart disease Maternal Grandfather    Cancer Paternal Grandmother        mouth   Anesthesia problems Daughter    Breast cancer Maternal Aunt    Throat cancer Maternal Uncle     Thyroid  disease Paternal Aunt    Hypotension Neg Hx    Malignant hyperthermia Neg Hx    Pseudochol deficiency Neg Hx    Colon cancer Neg Hx    Stomach cancer Neg Hx    Esophageal cancer Neg Hx    Pancreatic cancer Neg Hx    Rectal cancer Neg Hx      Social History   Socioeconomic History   Marital status: Divorced    Spouse name: Not on file   Number of children: 1   Years of education: Not on file   Highest education level: 9th grade  Occupational History   Not on file  Tobacco Use   Smoking status: Every Day    Current packs/day: 0.50    Average  packs/day: 0.5 packs/day for 50.0 years (25.0 ttl pk-yrs)    Types: Cigarettes   Smokeless tobacco: Never  Vaping Use   Vaping status: Some Days   Substances: Nicotine   Substance and Sexual Activity   Alcohol use: Yes    Comment: once a week   Drug use: Yes    Types: Marijuana    Comment: history of cocaine use, been about a year, per patient (12/18/21)   Sexual activity: Not Currently    Birth control/protection: Surgical  Other Topics Concern   Not on file  Social History Narrative   Not on file   Social Drivers of Health   Financial Resource Strain: Medium Risk (08/07/2023)   Overall Financial Resource Strain (CARDIA)    Difficulty of Paying Living Expenses: Somewhat hard  Food Insecurity: No Food Insecurity (12/04/2023)   Hunger Vital Sign    Worried About Running Out of Food in the Last Year: Never true    Ran Out of Food in the Last Year: Never true  Transportation Needs: No Transportation Needs (12/04/2023)   PRAPARE - Administrator, Civil Service (Medical): No    Lack of Transportation (Non-Medical): No  Physical Activity: Inactive (08/07/2023)   Exercise Vital Sign    Days of Exercise per Week: 0 days    Minutes of Exercise per Session: 0 min  Stress: No Stress Concern Present (08/07/2023)   [employer] of Occupational Health - Occupational Stress Questionnaire    Feeling of Stress : Only a  little  Social Connections: Moderately Isolated (12/04/2023)   Social Connection and Isolation Panel [NHANES]    Frequency of Communication with Friends and Family: Twice a week    Frequency of Social Gatherings with Friends and Family: Never    Attends Religious Services: 1 to 4 times per year    Active Member of [employer] or Organizations: No    Attends Banker Meetings: 1 to 4 times per year    Marital Status: Never married  Intimate Partner Violence: Not At Risk (12/04/2023)   Humiliation, Afraid, Rape, and Kick questionnaire    Fear of Current or Ex-Partner: No    Emotionally Abused: No    Physically Abused: No    Sexually Abused: No     Allergies  Allergen Reactions   Avocado Anaphylaxis   Latex Shortness Of Breath and Rash   Codeine  Nausea Only     Outpatient Medications Prior to Visit  Medication Sig Dispense Refill   albuterol  (VENTOLIN  HFA) 108 (90 Base) MCG/ACT inhaler INHALE 2 PUFFS into THE lungs EVERY 6 HOURS AS NEEDED wheezing AND SHORTNESS OF BREATH (Patient taking differently: Inhale 2 puffs into the lungs every 6 (six) hours as needed for shortness of breath or wheezing.) 54 g 1   alendronate  (FOSAMAX ) 70 MG tablet Take 1 tablet (70 mg total) by mouth every 7 (seven) days. Take with a full glass of water on an empty stomach. 12 tablet 1   aspirin  EC 81 MG tablet Take 81 mg by mouth in the morning.     atorvastatin  (LIPITOR ) 80 MG tablet Take 1 tablet (80 mg total) by mouth daily. (Patient taking differently: Take 80 mg by mouth in the morning.) 90 tablet 3   busPIRone  (BUSPAR ) 15 MG tablet Take 2 tablets (30 mg total) by mouth in the morning AND 1 tablet (15 mg total) daily at 12 noon AND 1 tablet (15 mg total) every evening. 360 tablet 0   clonazePAM  (KLONOPIN ) 0.5  MG tablet Take 1 tablet (0.5 mg total) by mouth 3 (three) times daily as needed for anxiety. (Patient taking differently: Take 0.5 mg by mouth in the morning and at bedtime.) 90 tablet 0   doxepin   (SINEQUAN ) 75 MG capsule Take 1 capsule (75 mg total) by mouth at bedtime. 90 capsule 0   escitalopram  (LEXAPRO ) 20 MG tablet Take 1 tablet (20 mg total) by mouth daily. 90 tablet 0   ezetimibe  (ZETIA ) 10 MG tablet Take 1 tablet (10 mg total) by mouth daily. (Patient taking differently: Take 10 mg by mouth at bedtime.) 90 tablet 3   Fluticasone -Umeclidin-Vilant (TRELEGY ELLIPTA ) 200-62.5-25 MCG/ACT AEPB Inhale 1 puff into the lungs daily. (Patient taking differently: Inhale 1 puff into the lungs in the morning.) 60 each 11   gabapentin  (NEURONTIN ) 600 MG tablet Take 1 tablet (600 mg total) by mouth 2 (two) times daily. 180 tablet 2   ipratropium-albuterol  (DUONEB) 0.5-2.5 (3) MG/3ML SOLN Take 3 mLs by nebulization every 8 (eight) hours. 90 mL 1   levothyroxine  (SYNTHROID ) 112 MCG tablet Take 1 tablet (112 mcg total) by mouth daily. (Patient taking differently: Take 112 mcg by mouth daily before breakfast.) 90 tablet 2   metFORMIN  (GLUCOPHAGE ) 500 MG tablet Take 1 tablet (500 mg total) by mouth 2 (two) times daily with a meal. 180 tablet 1   mirtazapine  (REMERON ) 45 MG tablet Take 1 tablet (45 mg total) by mouth at bedtime. 90 tablet 0   montelukast  (SINGULAIR ) 10 MG tablet Take 1 tablet (10 mg total) by mouth at bedtime. 90 tablet 2   nebivolol  (BYSTOLIC ) 10 MG tablet Take 1 tablet (10 mg total) by mouth daily. (Patient taking differently: Take 10 mg by mouth at bedtime.) 90 tablet 3   nitroGLYCERIN  (NITROSTAT ) 0.4 MG SL tablet Place 1 tablet (0.4 mg total) under the tongue every 5 (five) minutes as needed for chest pain. 25 tablet 3   omeprazole  (PRILOSEC) 40 MG capsule Take 1 capsule (40 mg total) by mouth 2 (two) times daily before a meal. 60 capsule 1   OXYGEN  Inhale 2 L/min into the lungs continuous.     varenicline  (CHANTIX  CONTINUING MONTH PAK) 1 MG tablet Take 1 tablet (1 mg total) by mouth 2 (two) times daily. (Patient not taking: Reported on 12/11/2023) 60 tablet 0   Varenicline  Tartrate,  Starter, (CHANTIX  STARTING MONTH PAK) 0.5 MG X 11 & 1 MG X 42 TBPK Please follow the instructions listed on the starter pack, followed by the continuing month pack. 53 each 0   amLODipine  (NORVASC ) 5 MG tablet Take 1 tablet (5 mg total) by mouth daily. (Patient taking differently: Take 5 mg by mouth in the morning.) 90 tablet 3   furosemide  (LASIX ) 20 MG tablet Take 1 tablet (20 mg total) by mouth daily. (Patient taking differently: Take 20 mg by mouth in the morning.) 90 tablet 3   losartan  (COZAAR ) 50 MG tablet Take 1.5 tablets (75 mg total) by mouth daily. (Patient not taking: Reported on 12/11/2023) 135 tablet 0   polyethylene glycol (MIRALAX  / GLYCOLAX ) 17 g packet Take 17 g by mouth daily as needed for severe constipation. (Patient not taking: Reported on 12/11/2023)     predniSONE  (DELTASONE ) 20 MG tablet Take 2 tablets (40 mg total) by mouth daily with breakfast. (Patient not taking: Reported on 12/11/2023) 4 tablet 0   ticagrelor  (BRILINTA ) 60 MG TABS tablet Take 1 tablet (60 mg total) by mouth 2 (two) times daily. 60 tablet 0  acetaminophen  (TYLENOL ) 500 MG tablet Take 2 tablets (1,000 mg total) by mouth every 6 (six) hours. (Patient not taking: Reported on 12/09/2023)     docusate sodium  (COLACE) 100 MG capsule Take 1 capsule (100 mg total) by mouth 2 (two) times daily as needed for mild constipation. (Patient not taking: Reported on 12/09/2023)     guaiFENesin  (MUCINEX ) 600 MG 12 hr tablet Take 1 tablet (600 mg total) by mouth 2 (two) times daily as needed for up to 8 doses. Take for cough/phlegm. (Patient not taking: Reported on 12/09/2023) 7 tablet 0   ondansetron  (ZOFRAN -ODT) 4 MG disintegrating tablet Take 1 tablet (4 mg total) by mouth every 8 (eight) hours as needed for nausea or vomiting. (Patient not taking: Reported on 12/09/2023) 20 tablet 0   No facility-administered medications prior to visit.    Review of Systems  Constitutional:  Negative for chills, fever, malaise/fatigue and  weight loss.  HENT:  Negative for congestion, sinus pain and sore throat.   Eyes: Negative.   Respiratory:  Positive for cough, sputum production, shortness of breath and wheezing. Negative for hemoptysis.   Cardiovascular:  Negative for chest pain, palpitations, orthopnea, claudication and leg swelling.  Gastrointestinal:  Negative for abdominal pain, heartburn, nausea and vomiting.  Genitourinary: Negative.   Musculoskeletal:  Negative for joint pain and myalgias.  Skin:  Negative for rash.  Neurological:  Negative for weakness.  Endo/Heme/Allergies: Negative.   Psychiatric/Behavioral:  The patient is nervous/anxious.     Objective:   Vitals:   12/09/23 1506  BP: 99/65  Pulse: 65  SpO2: 99%  Weight: 157 lb 3.2 oz (71.3 kg)  Height: 4' 9 (1.448 m)    Physical Exam Constitutional:      General: She is not in acute distress.    Appearance: Normal appearance.  Eyes:     General: No scleral icterus.    Conjunctiva/sclera: Conjunctivae normal.  Cardiovascular:     Rate and Rhythm: Normal rate and regular rhythm.  Pulmonary:     Breath sounds: No wheezing, rhonchi or rales.  Musculoskeletal:     Right lower leg: No edema.     Left lower leg: No edema.  Skin:    General: Skin is warm and dry.  Neurological:     General: No focal deficit present.    CBC    Component Value Date/Time   WBC 15.9 (H) 12/05/2023 0547   RBC 3.61 (L) 12/05/2023 0547   HGB 11.3 (L) 12/05/2023 0547   HGB 14.8 05/20/2023 1119   HCT 34.7 (L) 12/05/2023 0547   HCT 44.2 05/20/2023 1119   PLT 463 (H) 12/05/2023 0547   PLT 373 05/20/2023 1119   MCV 96.1 12/05/2023 0547   MCV 99 (H) 05/20/2023 1119   MCH 31.3 12/05/2023 0547   MCHC 32.6 12/05/2023 0547   RDW 15.0 12/05/2023 0547   RDW 13.2 05/20/2023 1119   LYMPHSABS 3.1 12/04/2023 0045   LYMPHSABS 2.6 04/05/2019 0926   MONOABS 1.3 (H) 12/04/2023 0045   EOSABS 0.1 12/04/2023 0045   EOSABS 0.3 04/05/2019 0926   BASOSABS 0.0 12/04/2023 0045    BASOSABS 0.1 04/05/2019 0926      Latest Ref Rng & Units 12/11/2023    4:48 PM 12/05/2023    5:47 AM 12/04/2023    1:49 AM  BMP  Glucose 70 - 99 mg/dL 90  161    BUN 8 - 27 mg/dL 12  17    Creatinine 0.96 - 1.00 mg/dL 0.45  0.87    BUN/Creat Ratio 12 - 28 15     Sodium 134 - 144 mmol/L 143  135  139   Potassium 3.5 - 5.2 mmol/L 4.6  3.9  3.7   Chloride 96 - 106 mmol/L 99  105    CO2 20 - 29 mmol/L 27  25    Calcium  8.7 - 10.3 mg/dL 9.6  8.7      Chest imaging: CXR 07/21/23 Cardiac shadow is within normal limits. Aortic calcifications are seen. The lungs are well aerated bilaterally. No focal infiltrate or sizable effusion is noted. No acute bony abnormality is noted. Old healed clavicular fracture is noted.  LCS CT Chest 02/07/23 1. Lung-RADS 3, probably benign findings. Short-term follow-up in 6 months is recommended with repeat low-dose chest CT without contrast (please use the following order, CT CHEST LCS NODULE FOLLOW-UP W/O CM). Three new scattered solid pulmonary nodules, largest 5.0 mm in the posterior left upper lobe. 2. Three-vessel coronary atherosclerosis. 3. Aortic Atherosclerosis (ICD10-I70.0) and Emphysema (ICD10-J43.9).  PFT:     No data to display          Labs:  Path:  Echo:  Heart Catheterization:     Assessment & Plan:   COPD exacerbation (HCC) - Plan: Ambulatory Referral for DME  Chronic hypoxemic respiratory failure (HCC) - Plan: Ambulatory Referral for DME  Discussion: [name] [name] is a 68 year old woman, daily smoker with history of COPD, DMII, GERD, and CAD who is referred to pulmonary clinic for hospital follow up of COPD exacerbation.  Chronic Obstructive Pulmonary Disease (COPD) -Continue trelegy ellipta  200, 1 puff daily -continue prednisone  10mg  daily -Recommend nicotine  patches (21mg  daily) and nicotine  lozenges (2-4mg  as needed) for smoking cessation.  Chronic Respiratory Failure - continue supplemental  oxygen   Congestive Heart Failure Bilateral leg swelling and recent weight gain indicate fluid retention. Low blood pressure complicates fluid management. Recent hospitalization led to doubling Lasix  dose, but hypotension remains a concern. - Hold amlodipine  and losartan  for two days. - Continue double dose of Lasix  (40 mg) for two days. - Monitor weight and blood pressure closely. - Consult primary care physician and cardiologist for long-term blood pressure management. - Order portable oxygen  concentrator.  Lung Nodule - continue to monitor.  Nicotine  Dependence Smokes one pack per day, reduced from over a pack. Initiating Chantix  with nicotine  patches and lozenges to aid cessation. Discussed Chantix  side effects and importance of reducing intake. Discussed smoking cessation for 4 minutes. - Start Chantix  with starter pack, titrate to 1 mg twice daily. - Use 14 mg daily nicotine  patches, consider 21 mg if needed. - Use mini nicotine  lozenges or Xen pouches as needed. - Encourage reduction to 2-5 cigarettes per day during first two weeks of Chantix . - Plan follow-up to assess progress and adjust treatment.  Follow up in 3 months  Duaine German, MD [facility] Office: [phone]   Current Outpatient Medications:    albuterol  (VENTOLIN  HFA) 108 (90 Base) MCG/ACT inhaler, INHALE 2 PUFFS into THE lungs EVERY 6 HOURS AS NEEDED wheezing AND SHORTNESS OF BREATH (Patient taking differently: Inhale 2 puffs into the lungs every 6 (six) hours as needed for shortness of breath or wheezing.), Disp: 54 g, Rfl: 1   alendronate  (FOSAMAX ) 70 MG tablet, Take 1 tablet (70 mg total) by mouth every 7 (seven) days. Take with a full glass of water on an empty stomach., Disp: 12 tablet, Rfl: 1   aspirin  EC 81 MG tablet,  Take 81 mg by mouth in the morning., Disp: , Rfl:    atorvastatin  (LIPITOR ) 80 MG tablet, Take 1 tablet (80 mg total) by mouth daily. (Patient taking differently:  Take 80 mg by mouth in the morning.), Disp: 90 tablet, Rfl: 3   busPIRone  (BUSPAR ) 15 MG tablet, Take 2 tablets (30 mg total) by mouth in the morning AND 1 tablet (15 mg total) daily at 12 noon AND 1 tablet (15 mg total) every evening., Disp: 360 tablet, Rfl: 0   clonazePAM  (KLONOPIN ) 0.5 MG tablet, Take 1 tablet (0.5 mg total) by mouth 3 (three) times daily as needed for anxiety. (Patient taking differently: Take 0.5 mg by mouth in the morning and at bedtime.), Disp: 90 tablet, Rfl: 0   doxepin  (SINEQUAN ) 75 MG capsule, Take 1 capsule (75 mg total) by mouth at bedtime., Disp: 90 capsule, Rfl: 0   escitalopram  (LEXAPRO ) 20 MG tablet, Take 1 tablet (20 mg total) by mouth daily., Disp: 90 tablet, Rfl: 0   ezetimibe  (ZETIA ) 10 MG tablet, Take 1 tablet (10 mg total) by mouth daily. (Patient taking differently: Take 10 mg by mouth at bedtime.), Disp: 90 tablet, Rfl: 3   Fluticasone -Umeclidin-Vilant (TRELEGY ELLIPTA ) 200-62.5-25 MCG/ACT AEPB, Inhale 1 puff into the lungs daily. (Patient taking differently: Inhale 1 puff into the lungs in the morning.), Disp: 60 each, Rfl: 11   gabapentin  (NEURONTIN ) 600 MG tablet, Take 1 tablet (600 mg total) by mouth 2 (two) times daily., Disp: 180 tablet, Rfl: 2   ipratropium-albuterol  (DUONEB) 0.5-2.5 (3) MG/3ML SOLN, Take 3 mLs by nebulization every 8 (eight) hours., Disp: 90 mL, Rfl: 1   levothyroxine  (SYNTHROID ) 112 MCG tablet, Take 1 tablet (112 mcg total) by mouth daily. (Patient taking differently: Take 112 mcg by mouth daily before breakfast.), Disp: 90 tablet, Rfl: 2   metFORMIN  (GLUCOPHAGE ) 500 MG tablet, Take 1 tablet (500 mg total) by mouth 2 (two) times daily with a meal., Disp: 180 tablet, Rfl: 1   mirtazapine  (REMERON ) 45 MG tablet, Take 1 tablet (45 mg total) by mouth at bedtime., Disp: 90 tablet, Rfl: 0   montelukast  (SINGULAIR ) 10 MG tablet, Take 1 tablet (10 mg total) by mouth at bedtime., Disp: 90 tablet, Rfl: 2   nebivolol  (BYSTOLIC ) 10 MG tablet, Take  1 tablet (10 mg total) by mouth daily. (Patient taking differently: Take 10 mg by mouth at bedtime.), Disp: 90 tablet, Rfl: 3   nitroGLYCERIN  (NITROSTAT ) 0.4 MG SL tablet, Place 1 tablet (0.4 mg total) under the tongue every 5 (five) minutes as needed for chest pain., Disp: 25 tablet, Rfl: 3   omeprazole  (PRILOSEC) 40 MG capsule, Take 1 capsule (40 mg total) by mouth 2 (two) times daily before a meal., Disp: 60 capsule, Rfl: 1   OXYGEN , Inhale 2 L/min into the lungs continuous., Disp: , Rfl:    varenicline  (CHANTIX  CONTINUING MONTH PAK) 1 MG tablet, Take 1 tablet (1 mg total) by mouth 2 (two) times daily. (Patient not taking: Reported on 12/11/2023), Disp: 60 tablet, Rfl: 0   Varenicline  Tartrate, Starter, (CHANTIX  STARTING MONTH PAK) 0.5 MG X 11 & 1 MG X 42 TBPK, Please follow the instructions listed on the starter pack, followed by the continuing month pack., Disp: 53 each, Rfl: 0   furosemide  (LASIX ) 40 MG tablet, Take 1 tablet (40 mg total) by mouth daily., Disp: 90 tablet, Rfl: 1   ticagrelor  (BRILINTA ) 60 MG TABS tablet, Take 1 tablet (60 mg total) by mouth 2 (two) times daily.,  Disp: 180 tablet, Rfl: 2

## 2023-12-09 NOTE — Transitions of Care (Post Inpatient/ED Visit) (Signed)
   12/09/2023  Name: [name] [name] MRN: [id number] DOB: [birth year]  Today's TOC FU Call Status: Today's TOC FU Call Status:: Successful TOC FU Call Completed TOC FU Call Complete Date: 12/09/23 Patient's Name and Date of Birth confirmed.  Transition Care Management Follow-up Telephone Call Date of Discharge: 12/05/23 Discharge Facility: [facility]) Type of Discharge: Inpatient Admission Primary Inpatient Discharge Diagnosis:: COPD exacerbation How have you been since you were released from the hospital?: Better (Tiffany said that she is doing better but continues to have some swelling in her legs.) Any questions or concerns?: No  Items Reviewed: Did you receive and understand the discharge instructions provided?: Yes Medications obtained,verified, and reconciled?: No Medications Not Reviewed Reasons:: Other: (Tiffany said that her mother has all of her medications and did not have any questions about the med regime and did not need to review the med list.  She has a nebulizer, glucometer and scale.) Any new allergies since your discharge?: No Dietary orders reviewed?: Yes Type of Diet Ordered:: heart healthy, low sodium Do you have support at home?: Yes People in Home [RPT]: alone Name of Support/Comfort Primary Source: Tiffany calls to check on her daily when she is not able to see her  Medications Reviewed Today: Medications Reviewed Today   Medications were not reviewed in this encounter     Home Care and Equipment/Supplies: Were Home Health Services Ordered?: Yes Name of Home Health Agency:: [facility] Has Agency set up a time to come to your home?: Yes First Home Health Visit Date: 12/08/23 Any new equipment or medical supplies ordered?: No  Functional Questionnaire: Do you need assistance with bathing/showering or dressing?: No Do you need assistance with meal preparation?: No Do you need assistance with eating?: No Do you have difficulty maintaining continence:  No Do you need assistance with getting out of bed/getting out of a chair/moving?: No (She uses O2 @ 2-3 L continuously.  Tiffany hopes to have her mothere evaluated for a POC today at the pulmonary appt.) Do you have difficulty managing or taking your medications?: Yes (Tiffany manages the medications,)  Follow up appointments reviewed: PCP Follow-up appointment confirmed?: Yes Date of PCP follow-up appointment?: 12/11/23 Follow-up Provider: [facility] Follow-up appointment confirmed?: Yes Date of Specialist follow-up appointment?: 12/09/23 Follow-Up Specialty Provider:: pulmonologist Do you need transportation to your follow-up appointment?: No Do you understand care options if your condition(s) worsen?: Yes-patient verbalized understanding    SIGNATURE Burnett Carson, RN

## 2023-12-10 ENCOUNTER — Other Ambulatory Visit: Payer: Self-pay | Admitting: Nurse Practitioner

## 2023-12-10 DIAGNOSIS — I2510 Atherosclerotic heart disease of native coronary artery without angina pectoris: Secondary | ICD-10-CM

## 2023-12-11 ENCOUNTER — Ambulatory Visit: Attending: Internal Medicine | Admitting: Internal Medicine

## 2023-12-11 ENCOUNTER — Other Ambulatory Visit (HOSPITAL_COMMUNITY): Payer: Self-pay

## 2023-12-11 ENCOUNTER — Encounter: Payer: Self-pay | Admitting: Internal Medicine

## 2023-12-11 VITALS — BP 127/65 | HR 70 | Temp 98.3°F | Ht <= 58 in | Wt 155.0 lb

## 2023-12-11 DIAGNOSIS — Z9981 Dependence on supplemental oxygen: Secondary | ICD-10-CM

## 2023-12-11 DIAGNOSIS — Z09 Encounter for follow-up examination after completed treatment for conditions other than malignant neoplasm: Secondary | ICD-10-CM | POA: Diagnosis not present

## 2023-12-11 DIAGNOSIS — I25118 Atherosclerotic heart disease of native coronary artery with other forms of angina pectoris: Secondary | ICD-10-CM | POA: Diagnosis not present

## 2023-12-11 DIAGNOSIS — I509 Heart failure, unspecified: Secondary | ICD-10-CM

## 2023-12-11 DIAGNOSIS — I10 Essential (primary) hypertension: Secondary | ICD-10-CM

## 2023-12-11 DIAGNOSIS — M810 Age-related osteoporosis without current pathological fracture: Secondary | ICD-10-CM

## 2023-12-11 DIAGNOSIS — I2510 Atherosclerotic heart disease of native coronary artery without angina pectoris: Secondary | ICD-10-CM

## 2023-12-11 DIAGNOSIS — E1129 Type 2 diabetes mellitus with other diabetic kidney complication: Secondary | ICD-10-CM | POA: Diagnosis not present

## 2023-12-11 DIAGNOSIS — F17210 Nicotine dependence, cigarettes, uncomplicated: Secondary | ICD-10-CM | POA: Diagnosis not present

## 2023-12-11 DIAGNOSIS — J449 Chronic obstructive pulmonary disease, unspecified: Secondary | ICD-10-CM

## 2023-12-11 DIAGNOSIS — E1169 Type 2 diabetes mellitus with other specified complication: Secondary | ICD-10-CM

## 2023-12-11 DIAGNOSIS — F17200 Nicotine dependence, unspecified, uncomplicated: Secondary | ICD-10-CM

## 2023-12-11 DIAGNOSIS — J9611 Chronic respiratory failure with hypoxia: Secondary | ICD-10-CM | POA: Diagnosis not present

## 2023-12-11 DIAGNOSIS — E039 Hypothyroidism, unspecified: Secondary | ICD-10-CM | POA: Diagnosis not present

## 2023-12-11 DIAGNOSIS — Z7984 Long term (current) use of oral hypoglycemic drugs: Secondary | ICD-10-CM

## 2023-12-11 MED ORDER — FUROSEMIDE 40 MG PO TABS
40.0000 mg | ORAL_TABLET | Freq: Every day | ORAL | 1 refills | Status: DC
Start: 2023-12-11 — End: 2024-06-02
  Filled 2023-12-11: qty 90, 90d supply, fill #0
  Filled 2024-03-04: qty 90, 90d supply, fill #1

## 2023-12-11 MED ORDER — TICAGRELOR 60 MG PO TABS
60.0000 mg | ORAL_TABLET | Freq: Two times a day (BID) | ORAL | 2 refills | Status: AC
  Filled 2023-12-11: qty 60, 30d supply, fill #0
  Filled 2024-01-12: qty 60, 30d supply, fill #1
  Filled 2024-02-10: qty 60, 30d supply, fill #2
  Filled 2024-03-11: qty 60, 30d supply, fill #3
  Filled 2024-04-04: qty 60, 30d supply, fill #4
  Filled 2024-05-03: qty 60, 30d supply, fill #5
  Filled 2024-06-02: qty 60, 30d supply, fill #6
  Filled 2024-06-27: qty 60, 30d supply, fill #7
  Filled 2024-07-26: qty 60, 30d supply, fill #8

## 2023-12-11 NOTE — Patient Instructions (Signed)
 VISIT SUMMARY:  You had a follow-up appointment today to discuss your recent hospitalizations for COPD and congestive heart failure. We reviewed your current symptoms, medications, and made some adjustments to your treatment plan.  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 You recently had a flare-up that required hospitalization. Continue using your Trelegy inhaler and nebulizer twice daily, and keep using oxygen  therapy at 3 liters with an intermittent setting, and 2 liters at home. Also, continue taking Chantix  to help you quit smoking.  -CONGESTIVE HEART FAILURE: Congestive heart failure means your heart is not pumping blood as well as it should. You had a recent flare-up with fluid retention and weight gain. Continue taking furosemide  40 mg daily to help reduce the fluid. We have temporarily stopped your amlodipine  and losartan  due to low blood pressure. You need to follow up with your cardiologist, Dr. Loetta Ringer, for further evaluation.  -HYPERTENSION: Hypertension is high blood pressure. Your blood pressure has been low, so we have temporarily stopped your amlodipine  and losartan . Continue taking nebivolol  as prescribed.  -DIABETES MELLITUS: Diabetes is a condition where your blood sugar levels are too high. You are taking metformin  once daily. We will check your A1c level today to see how well your blood sugar is being controlled.  -HYPOTHYROIDISM: Hypothyroidism is when your thyroid  gland does not produce enough thyroid  hormone. You are taking levothyroxine  consistently in the morning. We will check your thyroid  levels today to ensure they are in the normal range.  -DEPRESSION: Depression is a mood disorder that causes persistent feelings of sadness and loss of interest. You are currently taking Buspar , clonazepam , and Lexapro , and you report doing well on these medications. You are in the process of  transitioning to a new mental health provider.  -OSTEOPOROSIS: Osteoporosis is a condition that weakens bones, making them fragile and more likely to break. You are taking Fosamax  to strengthen your bones and have not reported any gastrointestinal side effects. Continue taking Fosamax  as prescribed.  -GENERAL HEALTH MAINTENANCE: You are due for your second shingles vaccine and need an annual urine protein screening due to diabetes. Please provide a urine sample today for the protein screening and get your second shingles vaccine at an outside pharmacy.  INSTRUCTIONS:  Please follow up with your cardiologist, Dr. Loetta Ringer, for your heart failure and edema. We will check your A1c and thyroid  levels today. Provide a urine sample for protein screening and get your second shingles vaccine at an outside pharmacy.

## 2023-12-11 NOTE — Progress Notes (Signed)
 Patient ID: [name], female    DOB: 1956-02-15  MRN: [id number]  CC: [facility] Visit Date Hosp: 5/22-23/25, [facility] Date call with CW: 12/09/23  Hospitalization Follow-up (Hospitalization f/u. Odette Benjamin phelgm, sore throat, cough X2 days/Discuss Losartan  - currently doubling lasix  due to 8lb fluid gain )   Subjective: [name] [name] is a 68 y.o. female who presents for TOC visit. Daughter is with her. Her concerns today include:  Patient with history of DM (A1C 6.8 08/07/2023), GERD, hypothyroidism, HTN, HL, CAD with previous stent, COPD, chronic hypoxic resp failure on 2 L O2 continuous, substance abuse, depression/anxiety and tob dep, essential thrombocytosis, iron  deficiency anemia, Osteopenia with fragility rib fractures on Fosamax    Discussed the use of AI scribe software for clinical note transcription with the patient, who gave verbal consent to proceed.  History of Present Illness [name] [name] is a 68 year old female with COPD and congestive heart failure who presents for follow-up after recent hospitalizations.  She has been hospitalized twice in the past month for COPD and congestive heart failure exacerbations. Post-discharge, she experiences dyspnea, especially when supine at night, causing anxiety. She uses two pillows and sleeps with oxygen  2L, though it sometimes dislodges. She uses her Trelegy inhaler and nebulizer twice daily and started Chantix  to help with smoking cessation.  She uses O2 2 L continuous except when she is using her portable device she increases it to 3 L.  Saw her pulmonologist Dr. Diania Fortes 2 days ago.  He had her increase furosemide  from 20 mg daily to 40 mg daily because she reported an 8 pound weight gain since hospital discharge based on her home scale and was having some increased lower extremity edema.  Blood pressure was on the low side so we had her hold off on taking Cozaar  and amlodipine  but continued the Nebivolol  until she saw me today.  Does  not have home device to check blood pressure.  Daughter feels she can do better with limiting the salt in her foods.  She has peripheral edema in her legs, feet, and ankles. Her weight has fluctuated.weight is down 3 pounds from when she saw her pulmonologist 2 days ago.  She lacks equipment to monitor blood pressure at home.  She has history of CAD.  She uses nitroglycerin  PRN for chest pressure, with two doses taken this morning. Her last echocardiogram was before discharge from hospital and showed normal EF and diastolic function with no increased pulmonary pressure.  She has not seen her cardiologist since last year.   Hypothyroidism: TSH 1 month ago was 11.  Since then she has been taking levothyroxine  consistently every morning.    Osteopenia with recent history of fragility fractures: She is taking and tolerating the Fosamax  once a week.    Bipolar/depression/anxiety: Her previous psychiatrist has left and she will be assigned a new one.  She continues to take BuSpar , clonazepam , Lexapro .    DM: In regards to her diabetes, she was supposed to be on metformin  500 mg twice a day but is taking it just once a day because she does not eat too much during the day. Lab Results  Component Value Date   HGBA1C 6.2 (H) 09/01/2023       Patient Active Problem List   Diagnosis Date Noted   Acute on chronic heart failure with preserved ejection fraction (HFpEF, >= 50%) (HCC) 12/05/2023   At risk for polypharmacy 12/04/2023   Long-term current use of antidepressant 11/17/2023  Long-term current use of benzodiazepine 11/17/2023   Sigmoid diverticulitis 11/12/2023   Acute kidney injury superimposed on chronic kidney disease (HCC) 11/12/2023   CKD (chronic kidney disease) stage 2, GFR 60-89 ml/min 11/12/2023   Sepsis secondary to sigmoid diverticulitis (HCC) 11/12/2023   Acute on chronic hypoxic respiratory failure (HCC) 10/05/2023   Pneumothorax, right 09/16/2023   COPD with acute  exacerbation (HCC) 09/01/2023   Chronic hypoxic respiratory failure (HCC) 09/01/2023   Continuous dependence on cigarette smoking 09/01/2023   History of CAD (coronary artery disease) 09/01/2023   CAP (community acquired pneumonia) 09/01/2023   Full code status 06/14/2023   Acute hypoxic respiratory failure (HCC) 06/13/2023   Acute exacerbation of chronic obstructive pulmonary disease (COPD) (HCC) 06/12/2023   Nicotine  dependence 05/03/2023   Substance abuse (HCC) 09/10/2022   Iron  deficiency anemia 11/02/2021   Major depressive disorder, recurrent, in full remission with anxious distress (HCC) 05/31/2021   Chronic neck pain 02/26/2021   GAD (generalized anxiety disorder) 01/08/2021   Esophageal dysphagia 04/28/2019   Bipolar disorder (HCC) 09/17/2018   Insomnia 10/28/2017   Prediabetes 05/28/2017   QT prolongation 03/25/2017   Psoriasis 06/22/2015   COPD (chronic obstructive pulmonary disease) (HCC)GOLD E 05/18/2015   Anxiety and depression 07/10/2013   Essential hypertension 06/07/2013   Hyperlipidemia 06/07/2013   Asthma, chronic 06/07/2013   Emphysema lung (HCC) 06/07/2013   Chronic back pain 06/07/2013   CAD S/P percutaneous coronary angioplasty 06/07/2013   GERD (gastroesophageal reflux disease) 06/07/2013   Breast lump on left side at 3 o'clock position 10/13/2012   S/P abdominal hysterectomy and right salpingo-oophorectomy 08/29/2011   COPD exacerbation (HCC) 09/15/2009   TOBACCO ABUSE 07/17/2009   Chronic rhinitis 07/17/2009   Lung nodule < 6cm on CT 07/17/2009   Hypothyroidism 12/22/2008   Chronic depression 12/22/2008     Current Outpatient Medications on File Prior to Visit  Medication Sig Dispense Refill   albuterol  (VENTOLIN  HFA) 108 (90 Base) MCG/ACT inhaler INHALE 2 PUFFS into THE lungs EVERY 6 HOURS AS NEEDED wheezing AND SHORTNESS OF BREATH (Patient taking differently: Inhale 2 puffs into the lungs every 6 (six) hours as needed for shortness of breath or  wheezing.) 54 g 1   alendronate  (FOSAMAX ) 70 MG tablet Take 1 tablet (70 mg total) by mouth every 7 (seven) days. Take with a full glass of water on an empty stomach. 12 tablet 1   aspirin  EC 81 MG tablet Take 81 mg by mouth in the morning.     atorvastatin  (LIPITOR ) 80 MG tablet Take 1 tablet (80 mg total) by mouth daily. (Patient taking differently: Take 80 mg by mouth in the morning.) 90 tablet 3   busPIRone  (BUSPAR ) 15 MG tablet Take 2 tablets (30 mg total) by mouth in the morning AND 1 tablet (15 mg total) daily at 12 noon AND 1 tablet (15 mg total) every evening. 360 tablet 0   clonazePAM  (KLONOPIN ) 0.5 MG tablet Take 1 tablet (0.5 mg total) by mouth 3 (three) times daily as needed for anxiety. (Patient taking differently: Take 0.5 mg by mouth in the morning and at bedtime.) 90 tablet 0   doxepin  (SINEQUAN ) 75 MG capsule Take 1 capsule (75 mg total) by mouth at bedtime. 90 capsule 0   escitalopram  (LEXAPRO ) 20 MG tablet Take 1 tablet (20 mg total) by mouth daily. 90 tablet 0   ezetimibe  (ZETIA ) 10 MG tablet Take 1 tablet (10 mg total) by mouth daily. (Patient taking differently: Take 10 mg by mouth  at bedtime.) 90 tablet 3   Fluticasone -Umeclidin-Vilant (TRELEGY ELLIPTA ) 200-62.5-25 MCG/ACT AEPB Inhale 1 puff into the lungs daily. (Patient taking differently: Inhale 1 puff into the lungs in the morning.) 60 each 11   gabapentin  (NEURONTIN ) 600 MG tablet Take 1 tablet (600 mg total) by mouth 2 (two) times daily. 180 tablet 2   ipratropium-albuterol  (DUONEB) 0.5-2.5 (3) MG/3ML SOLN Take 3 mLs by nebulization every 8 (eight) hours. 90 mL 1   levothyroxine  (SYNTHROID ) 112 MCG tablet Take 1 tablet (112 mcg total) by mouth daily. (Patient taking differently: Take 112 mcg by mouth daily before breakfast.) 90 tablet 2   metFORMIN  (GLUCOPHAGE ) 500 MG tablet Take 1 tablet (500 mg total) by mouth 2 (two) times daily with a meal. 180 tablet 1   mirtazapine  (REMERON ) 45 MG tablet Take 1 tablet (45 mg total)  by mouth at bedtime. 90 tablet 0   montelukast  (SINGULAIR ) 10 MG tablet Take 1 tablet (10 mg total) by mouth at bedtime. 90 tablet 2   nebivolol  (BYSTOLIC ) 10 MG tablet Take 1 tablet (10 mg total) by mouth daily. (Patient taking differently: Take 10 mg by mouth at bedtime.) 90 tablet 3   nitroGLYCERIN  (NITROSTAT ) 0.4 MG SL tablet Place 1 tablet (0.4 mg total) under the tongue every 5 (five) minutes as needed for chest pain. 25 tablet 3   omeprazole  (PRILOSEC) 40 MG capsule Take 1 capsule (40 mg total) by mouth 2 (two) times daily before a meal. 60 capsule 1   OXYGEN  Inhale 2 L/min into the lungs continuous.     ticagrelor  (BRILINTA ) 60 MG TABS tablet Take 1 tablet (60 mg total) by mouth 2 (two) times daily. 180 tablet 2   Varenicline  Tartrate, Starter, (CHANTIX  STARTING MONTH PAK) 0.5 MG X 11 & 1 MG X 42 TBPK Please follow the instructions listed on the starter pack, followed by the continuing month pack. 53 each 0   varenicline  (CHANTIX  CONTINUING MONTH PAK) 1 MG tablet Take 1 tablet (1 mg total) by mouth 2 (two) times daily. (Patient not taking: Reported on 12/11/2023) 60 tablet 0   No current facility-administered medications on file prior to visit.    Allergies  Allergen Reactions   Avocado Anaphylaxis   Latex Shortness Of Breath and Rash   Codeine  Nausea Only    Social History   Socioeconomic History   Marital status: Divorced    Spouse name: Not on file   Number of children: 1   Years of education: Not on file   Highest education level: 9th grade  Occupational History   Not on file  Tobacco Use   Smoking status: Every Day    Current packs/day: 0.50    Average packs/day: 0.5 packs/day for 50.0 years (25.0 ttl pk-yrs)    Types: Cigarettes   Smokeless tobacco: Never  Vaping Use   Vaping status: Some Days   Substances: Nicotine   Substance and Sexual Activity   Alcohol use: Yes    Comment: once a week   Drug use: Yes    Types: Marijuana    Comment: history of cocaine use,  been about a year, per patient (12/18/21)   Sexual activity: Not Currently    Birth control/protection: Surgical  Other Topics Concern   Not on file  Social History Narrative   Not on file   Social Drivers of Health   Financial Resource Strain: Medium Risk (08/07/2023)   Overall Financial Resource Strain (CARDIA)    Difficulty of Paying Living Expenses: Somewhat hard  Food Insecurity: No Food Insecurity (12/04/2023)   Hunger Vital Sign    Worried About Running Out of Food in the Last Year: Never true    Ran Out of Food in the Last Year: Never true  Transportation Needs: No Transportation Needs (12/04/2023)   PRAPARE - Administrator, Civil Service (Medical): No    Lack of Transportation (Non-Medical): No  Physical Activity: Inactive (08/07/2023)   Exercise Vital Sign    Days of Exercise per Week: 0 days    Minutes of Exercise per Session: 0 min  Stress: No Stress Concern Present (08/07/2023)   [employer] of Occupational Health - Occupational Stress Questionnaire    Feeling of Stress : Only a little  Social Connections: Moderately Isolated (12/04/2023)   Social Connection and Isolation Panel [NHANES]    Frequency of Communication with Friends and Family: Twice a week    Frequency of Social Gatherings with Friends and Family: Never    Attends Religious Services: 1 to 4 times per year    Active Member of Clubs or Organizations: No    Attends Banker Meetings: 1 to 4 times per year    Marital Status: Never married  Intimate Partner Violence: Not At Risk (12/04/2023)   Humiliation, Afraid, Rape, and Kick questionnaire    Fear of Current or Ex-Partner: No    Emotionally Abused: No    Physically Abused: No    Sexually Abused: No    Family History  Problem Relation Age of Onset   Heart disease Mother    Hypertension Mother    Stroke Mother    Mental illness Mother    Heart disease Father    Hypertension Father    Diabetes Father    Colon polyps  Brother    Heart disease Maternal Grandfather    Cancer Paternal Grandmother        mouth   Anesthesia problems Daughter    Breast cancer Maternal Aunt    Throat cancer Maternal Uncle    Thyroid  disease Paternal Aunt    Hypotension Neg Hx    Malignant hyperthermia Neg Hx    Pseudochol deficiency Neg Hx    Colon cancer Neg Hx    Stomach cancer Neg Hx    Esophageal cancer Neg Hx    Pancreatic cancer Neg Hx    Rectal cancer Neg Hx     Past Surgical History:  Procedure Laterality Date   ABDOMINAL EXPLORATION SURGERY  1977   ABDOMINAL HYSTERECTOMY  1977   "left one of my ovaries"   BACK SURGERY     BIOPSY  05/27/2019   Procedure: BIOPSY;  Surgeon: Suzette Espy, MD;  Location: [facility];  Service: Endoscopy;;   BREAST BIOPSY Left 11/2021   x2   COLONOSCOPY  2011   Dr. Howard Macho: Mild diverticulosis, descending diminutive colon polyp (not retrieved), next colonoscopy 10 years   CORONARY ANGIOPLASTY WITH STENT PLACEMENT  2011 X2   "regular stents didn't work; had to go back in in ~ 1 month and put in medicated stents"   DILATION AND CURETTAGE OF UTERUS     ESOPHAGOGASTRODUODENOSCOPY     ESOPHAGOGASTRODUODENOSCOPY (EGD) WITH PROPOFOL  N/A 05/27/2019   normal esophagus, dilation, erosive gastropathy s/p biopsy, normal duodenum. Negative H.pylori.    LEFT HEART CATH AND CORONARY ANGIOGRAPHY N/A 03/26/2017   Procedure: LEFT HEART CATH AND CORONARY ANGIOGRAPHY;  Surgeon: Arty Binning, MD;  Location: [facility];  Service: Cardiovascular;  Laterality: N/A;  LUMBAR LAMINECTOMY/DECOMPRESSION MICRODISCECTOMY  08/05/2011   Procedure: LUMBAR LAMINECTOMY/DECOMPRESSION MICRODISCECTOMY;  Surgeon: Shary Deems, MD;  Location: [facility]UR[facility];  Service: Neurosurgery;  Laterality: Right;  Right Lumbar four-five extraforaminal discectomy   MALONEY DILATION N/A 05/27/2019   Procedure: Londa Rival DILATION;  Surgeon: Suzette Espy, MD;  Location: [facility];  Service: Endoscopy;   Laterality: N/A;  54   SHOULDER ARTHROSCOPY WITH ROTATOR CUFF REPAIR AND OPEN BICEPS TENODESIS Right 12/24/2021   Procedure: RIGHT SHOULDER ARTHROSCOPY WITH ROTATOR CUFF REPAIR AND BICEPS TENODESIS;  Surgeon: Adah Acron, MD;  Location: [facility] OR;  Service: Orthopedics;  Laterality: Right;   TONSILLECTOMY     as a child   UPPER GASTROINTESTINAL ENDOSCOPY      ROS: Review of Systems Negative except as stated above  PHYSICAL EXAM: BP 127/65 (BP Location: Left Arm, Patient Position: Sitting, Cuff Size: Large)   Pulse 70   Temp 98.3 F (36.8 C) (Oral)   Ht 4\' 9"  (1.448 m)   Wt 155 lb (70.3 kg)   SpO2 96%   BMI 33.54 kg/m   Wt Readings from Last 3 Encounters:  12/11/23 155 lb (70.3 kg)  12/09/23 157 lb 3.2 oz (71.3 kg)  12/05/23 154 lb 10.4 oz (70.1 kg)    Physical Exam  General appearance -older Caucasian female sitting on exam table.  She has her 3 L of portable O2 on.  She is in no acute distress but talks in short phrases. Mental status - normal mood, behavior, speech, dress, motor activity, and thought processes Mouth -slightly dry oral mucosa Neck - supple, no significant adenopathy Chest -breath sounds significantly decreased bilaterally but equal with scattered wheezes Heart - normal rate, regular rhythm, normal S1, S2, no murmurs, rubs, clicks or gallops Extremities -trace to 1+ lower extremity edema from about the mid shin down      Latest Ref Rng & Units 12/05/2023    5:47 AM 12/04/2023    1:49 AM 12/04/2023   12:45 AM  CMP  Glucose 70 - 99 mg/dL 098   119   BUN 8 - 23 mg/dL 17   9   Creatinine 1.47 - 1.00 mg/dL 8.29   5.62   Sodium 130 - 145 mmol/L 135  139  140   Potassium 3.5 - 5.1 mmol/L 3.9  3.7  4.1   Chloride 98 - 111 mmol/L 105   103   CO2 22 - 32 mmol/L 25   25   Calcium  8.9 - 10.3 mg/dL 8.7   9.7    Lipid Panel     Component Value Date/Time   CHOL 192 06/26/2023 1339   TRIG 168 (H) 06/26/2023 1339   HDL 71 06/26/2023 1339   CHOLHDL 2.7 06/26/2023  1339   CHOLHDL 3.8 04/16/2016 0934   VLDL 15 04/16/2016 0934   LDLCALC 93 06/26/2023 1339    CBC    Component Value Date/Time   WBC 15.9 (H) 12/05/2023 0547   RBC 3.61 (L) 12/05/2023 0547   HGB 11.3 (L) 12/05/2023 0547   HGB 14.8 05/20/2023 1119   HCT 34.7 (L) 12/05/2023 0547   HCT 44.2 05/20/2023 1119   PLT 463 (H) 12/05/2023 0547   PLT 373 05/20/2023 1119   MCV 96.1 12/05/2023 0547   MCV 99 (H) 05/20/2023 1119   MCH 31.3 12/05/2023 0547   MCHC 32.6 12/05/2023 0547   RDW 15.0 12/05/2023 0547   RDW 13.2 05/20/2023 1119   LYMPHSABS 3.1 12/04/2023 0045   LYMPHSABS  2.6 04/05/2019 0926   MONOABS 1.3 (H) 12/04/2023 0045   EOSABS 0.1 12/04/2023 0045   EOSABS 0.3 04/05/2019 0926   BASOSABS 0.0 12/04/2023 0045   BASOSABS 0.1 04/05/2019 0926    ASSESSMENT AND PLAN: 1. Hospital discharge follow-up (Primary) Medication reconciliation completed with patient and her daughter today.  Med list has been updated.  2. COPD, severe (HCC) Not a good sign that she has had recurrent hospitalizations due to COPD exacerbation. Strongly advised to discontinue smoking.  She has started the Chantix . Continue Trelegy inhaler and nebulizer treatments  3. Chronic hypoxic respiratory failure, on home oxygen  therapy (HCC) Continue O2 2 L on home device and 3 L on portable  4. Tobacco use disorder See #2 above.  5. Acquired hypothyroidism Continue levothyroxine .  Will recheck TSH level today - TSH  6. Type 2 diabetes mellitus with other specified complication, without long-term current use of insulin  (HCC) A1c at goal.  She can continue metformin  500 mg once a day - Hemoglobin A1c - Microalbumin / creatinine urine ratio - Basic Metabolic Panel  7. Chronic heart failure, unspecified heart failure type [facility]) Will get her back in with Dr. Loetta Ringer for an assessment.  Discussed the importance of taking the levothyroxine  every day as well is uncontrolled hypothyroidism can cause lower extremity  edema as well -Continue furosemide  40 mg daily. Will discontinue amlodipine  and Cozaar .  Continue Nebivolol . - Ambulatory referral to Cardiology  8. Coronary artery disease involving native coronary artery of native heart with angina pectoris Continue Nebivolol , aspirin , Brilinta  and atorvastatin  - Ambulatory referral to Cardiology  9. Osteoporosis without current pathological fracture, unspecified osteoporosis type Continue Fosamax .    Patient was given the opportunity to ask questions.  Patient verbalized understanding of the plan and was able to repeat key elements of the plan.   This documentation was completed using Paediatric nurse.  Any transcriptional errors are unintentional.  Orders Placed This Encounter  Procedures   TSH   Hemoglobin A1c   Microalbumin / creatinine urine ratio   Basic Metabolic Panel   Ambulatory referral to Cardiology     Requested Prescriptions   Signed Prescriptions Disp Refills   furosemide  (LASIX ) 40 MG tablet 90 tablet 1    Sig: Take 1 tablet (40 mg total) by mouth daily.    Return in about 3 months (around 03/12/2024) for Medicare Wellness Visit in 3   wks with CMA/LPN.  [name] Dee, MD, FACP

## 2023-12-12 ENCOUNTER — Other Ambulatory Visit: Payer: Self-pay

## 2023-12-12 DIAGNOSIS — F411 Generalized anxiety disorder: Secondary | ICD-10-CM | POA: Diagnosis not present

## 2023-12-12 DIAGNOSIS — N189 Chronic kidney disease, unspecified: Secondary | ICD-10-CM | POA: Diagnosis not present

## 2023-12-12 DIAGNOSIS — E1122 Type 2 diabetes mellitus with diabetic chronic kidney disease: Secondary | ICD-10-CM | POA: Diagnosis not present

## 2023-12-12 DIAGNOSIS — J441 Chronic obstructive pulmonary disease with (acute) exacerbation: Secondary | ICD-10-CM | POA: Diagnosis not present

## 2023-12-12 DIAGNOSIS — F32A Depression, unspecified: Secondary | ICD-10-CM | POA: Diagnosis not present

## 2023-12-12 DIAGNOSIS — D649 Anemia, unspecified: Secondary | ICD-10-CM | POA: Diagnosis not present

## 2023-12-12 DIAGNOSIS — I130 Hypertensive heart and chronic kidney disease with heart failure and stage 1 through stage 4 chronic kidney disease, or unspecified chronic kidney disease: Secondary | ICD-10-CM | POA: Diagnosis not present

## 2023-12-12 DIAGNOSIS — I5033 Acute on chronic diastolic (congestive) heart failure: Secondary | ICD-10-CM | POA: Diagnosis not present

## 2023-12-12 DIAGNOSIS — I2510 Atherosclerotic heart disease of native coronary artery without angina pectoris: Secondary | ICD-10-CM | POA: Diagnosis not present

## 2023-12-13 ENCOUNTER — Ambulatory Visit: Payer: Self-pay | Admitting: Internal Medicine

## 2023-12-13 LAB — BASIC METABOLIC PANEL WITH GFR
BUN/Creatinine Ratio: 15 (ref 12–28)
BUN: 12 mg/dL (ref 8–27)
CO2: 27 mmol/L (ref 20–29)
Calcium: 9.6 mg/dL (ref 8.7–10.3)
Chloride: 99 mmol/L (ref 96–106)
Creatinine, Ser: 0.79 mg/dL (ref 0.57–1.00)
Glucose: 90 mg/dL (ref 70–99)
Potassium: 4.6 mmol/L (ref 3.5–5.2)
Sodium: 143 mmol/L (ref 134–144)
eGFR: 82 mL/min/{1.73_m2} (ref >59)

## 2023-12-13 LAB — MICROALBUMIN / CREATININE URINE RATIO
Creatinine, Urine: 100.2 mg/dL
Microalb/Creat Ratio: <3 mg/g{creat} (ref 0–29)
Microalbumin, Urine: <3 ug/mL

## 2023-12-13 LAB — HEMOGLOBIN A1C
Est. average glucose Bld gHb Est-mCnc: 137 mg/dL
Hgb A1c MFr Bld: 6.4 % — ABNORMAL HIGH (ref 4.8–5.6)

## 2023-12-13 LAB — TSH: TSH: 3.52 u[IU]/mL (ref 0.450–4.500)

## 2023-12-15 DIAGNOSIS — N189 Chronic kidney disease, unspecified: Secondary | ICD-10-CM | POA: Diagnosis not present

## 2023-12-15 DIAGNOSIS — J441 Chronic obstructive pulmonary disease with (acute) exacerbation: Secondary | ICD-10-CM | POA: Diagnosis not present

## 2023-12-15 DIAGNOSIS — D649 Anemia, unspecified: Secondary | ICD-10-CM | POA: Diagnosis not present

## 2023-12-15 DIAGNOSIS — I2510 Atherosclerotic heart disease of native coronary artery without angina pectoris: Secondary | ICD-10-CM | POA: Diagnosis not present

## 2023-12-15 DIAGNOSIS — E1122 Type 2 diabetes mellitus with diabetic chronic kidney disease: Secondary | ICD-10-CM | POA: Diagnosis not present

## 2023-12-15 DIAGNOSIS — I130 Hypertensive heart and chronic kidney disease with heart failure and stage 1 through stage 4 chronic kidney disease, or unspecified chronic kidney disease: Secondary | ICD-10-CM | POA: Diagnosis not present

## 2023-12-15 DIAGNOSIS — F411 Generalized anxiety disorder: Secondary | ICD-10-CM | POA: Diagnosis not present

## 2023-12-15 DIAGNOSIS — F32A Depression, unspecified: Secondary | ICD-10-CM | POA: Diagnosis not present

## 2023-12-15 DIAGNOSIS — I5033 Acute on chronic diastolic (congestive) heart failure: Secondary | ICD-10-CM | POA: Diagnosis not present

## 2023-12-16 ENCOUNTER — Telehealth: Payer: Self-pay

## 2023-12-16 NOTE — Telephone Encounter (Signed)
 Fax received from [employer] with request for PCS.   Per Arlina Benjamin, Referral Coordinator, the patient had regular Medicaid, not [employer]  I called patient's daughter, Jyl Or, to inquire if she is interested in [facility] for her mother and to inform her that the referral form was received from [facility].  Message left with call back requested.

## 2023-12-17 ENCOUNTER — Encounter: Payer: Self-pay | Admitting: Pulmonary Disease

## 2023-12-17 ENCOUNTER — Encounter: Payer: Self-pay | Admitting: Internal Medicine

## 2023-12-17 DIAGNOSIS — I5033 Acute on chronic diastolic (congestive) heart failure: Secondary | ICD-10-CM | POA: Diagnosis not present

## 2023-12-17 DIAGNOSIS — F32A Depression, unspecified: Secondary | ICD-10-CM | POA: Diagnosis not present

## 2023-12-17 DIAGNOSIS — D649 Anemia, unspecified: Secondary | ICD-10-CM | POA: Diagnosis not present

## 2023-12-17 DIAGNOSIS — J441 Chronic obstructive pulmonary disease with (acute) exacerbation: Secondary | ICD-10-CM

## 2023-12-17 DIAGNOSIS — I130 Hypertensive heart and chronic kidney disease with heart failure and stage 1 through stage 4 chronic kidney disease, or unspecified chronic kidney disease: Secondary | ICD-10-CM | POA: Diagnosis not present

## 2023-12-17 DIAGNOSIS — E1122 Type 2 diabetes mellitus with diabetic chronic kidney disease: Secondary | ICD-10-CM | POA: Diagnosis not present

## 2023-12-17 DIAGNOSIS — I2510 Atherosclerotic heart disease of native coronary artery without angina pectoris: Secondary | ICD-10-CM | POA: Diagnosis not present

## 2023-12-17 DIAGNOSIS — N189 Chronic kidney disease, unspecified: Secondary | ICD-10-CM | POA: Diagnosis not present

## 2023-12-17 DIAGNOSIS — F411 Generalized anxiety disorder: Secondary | ICD-10-CM | POA: Diagnosis not present

## 2023-12-17 NOTE — Telephone Encounter (Signed)
 I spoke to patient's daughter, Jyl Or, and explained about the fax received from [employer] for [facility] for her mother  I told her that we verified her mother's Medicaid policy and it is standard Medicaid, not [employer].  Tiffany said she did not understand why that would happen and she also stated that she does not feel that her mother needs PCS at this time.   I told her that she can call back at anytime if she feels her mother would benefit from [facility] and she said she would.

## 2023-12-18 ENCOUNTER — Other Ambulatory Visit: Payer: Self-pay

## 2023-12-18 MED ORDER — PREDNISONE 10 MG PO TABS
ORAL_TABLET | ORAL | 0 refills | Status: AC
  Filled 2023-12-18: qty 30, 12d supply, fill #0

## 2023-12-18 NOTE — Telephone Encounter (Signed)
**Note De-identified  [place] Obfuscation** Please advise 

## 2023-12-19 ENCOUNTER — Other Ambulatory Visit: Payer: Self-pay | Admitting: *Deleted

## 2023-12-19 DIAGNOSIS — Z122 Encounter for screening for malignant neoplasm of respiratory organs: Secondary | ICD-10-CM

## 2023-12-19 DIAGNOSIS — Z87891 Personal history of nicotine dependence: Secondary | ICD-10-CM

## 2023-12-19 DIAGNOSIS — F17210 Nicotine dependence, cigarettes, uncomplicated: Secondary | ICD-10-CM

## 2023-12-22 ENCOUNTER — Other Ambulatory Visit (HOSPITAL_COMMUNITY): Payer: Self-pay | Admitting: Physician Assistant

## 2023-12-22 DIAGNOSIS — F411 Generalized anxiety disorder: Secondary | ICD-10-CM

## 2023-12-24 DIAGNOSIS — F32A Depression, unspecified: Secondary | ICD-10-CM | POA: Diagnosis not present

## 2023-12-24 DIAGNOSIS — J441 Chronic obstructive pulmonary disease with (acute) exacerbation: Secondary | ICD-10-CM | POA: Diagnosis not present

## 2023-12-24 DIAGNOSIS — F411 Generalized anxiety disorder: Secondary | ICD-10-CM | POA: Diagnosis not present

## 2023-12-24 DIAGNOSIS — I2510 Atherosclerotic heart disease of native coronary artery without angina pectoris: Secondary | ICD-10-CM | POA: Diagnosis not present

## 2023-12-24 DIAGNOSIS — E1122 Type 2 diabetes mellitus with diabetic chronic kidney disease: Secondary | ICD-10-CM | POA: Diagnosis not present

## 2023-12-24 DIAGNOSIS — I5033 Acute on chronic diastolic (congestive) heart failure: Secondary | ICD-10-CM | POA: Diagnosis not present

## 2023-12-24 DIAGNOSIS — I130 Hypertensive heart and chronic kidney disease with heart failure and stage 1 through stage 4 chronic kidney disease, or unspecified chronic kidney disease: Secondary | ICD-10-CM | POA: Diagnosis not present

## 2023-12-24 DIAGNOSIS — N189 Chronic kidney disease, unspecified: Secondary | ICD-10-CM | POA: Diagnosis not present

## 2023-12-24 DIAGNOSIS — D649 Anemia, unspecified: Secondary | ICD-10-CM | POA: Diagnosis not present

## 2023-12-25 DIAGNOSIS — J441 Chronic obstructive pulmonary disease with (acute) exacerbation: Secondary | ICD-10-CM | POA: Diagnosis not present

## 2023-12-25 DIAGNOSIS — D649 Anemia, unspecified: Secondary | ICD-10-CM | POA: Diagnosis not present

## 2023-12-25 DIAGNOSIS — F411 Generalized anxiety disorder: Secondary | ICD-10-CM | POA: Diagnosis not present

## 2023-12-25 DIAGNOSIS — E1122 Type 2 diabetes mellitus with diabetic chronic kidney disease: Secondary | ICD-10-CM | POA: Diagnosis not present

## 2023-12-25 DIAGNOSIS — I5033 Acute on chronic diastolic (congestive) heart failure: Secondary | ICD-10-CM | POA: Diagnosis not present

## 2023-12-25 DIAGNOSIS — F32A Depression, unspecified: Secondary | ICD-10-CM | POA: Diagnosis not present

## 2023-12-25 DIAGNOSIS — I130 Hypertensive heart and chronic kidney disease with heart failure and stage 1 through stage 4 chronic kidney disease, or unspecified chronic kidney disease: Secondary | ICD-10-CM | POA: Diagnosis not present

## 2023-12-25 DIAGNOSIS — N189 Chronic kidney disease, unspecified: Secondary | ICD-10-CM | POA: Diagnosis not present

## 2023-12-25 DIAGNOSIS — I2510 Atherosclerotic heart disease of native coronary artery without angina pectoris: Secondary | ICD-10-CM | POA: Diagnosis not present

## 2023-12-26 DIAGNOSIS — Z79899 Other long term (current) drug therapy: Secondary | ICD-10-CM | POA: Diagnosis not present

## 2023-12-27 LAB — LIPID PANEL
Chol/HDL Ratio: 1.8 ratio (ref 0.0–4.4)
Cholesterol, Total: 145 mg/dL (ref 100–199)
HDL: 79 mg/dL (ref >39)
LDL Chol Calc (NIH): 48 mg/dL (ref 0–99)
Triglycerides: 100 mg/dL (ref 0–149)
VLDL Cholesterol Cal: 18 mg/dL (ref 5–40)

## 2023-12-27 LAB — HEPATIC FUNCTION PANEL
ALT: 17 IU/L (ref 0–32)
AST: 14 IU/L (ref 0–40)
Albumin: 4.3 g/dL (ref 3.9–4.9)
Alkaline Phosphatase: 113 IU/L (ref 44–121)
Bilirubin Total: <0.2 mg/dL (ref 0.0–1.2)
Bilirubin, Direct: <0.08 mg/dL (ref 0.00–0.40)
Total Protein: 6.1 g/dL (ref 6.0–8.5)

## 2023-12-29 ENCOUNTER — Ambulatory Visit: Payer: Self-pay | Admitting: Physician Assistant

## 2023-12-30 ENCOUNTER — Other Ambulatory Visit (HOSPITAL_COMMUNITY): Payer: Self-pay

## 2023-12-30 ENCOUNTER — Other Ambulatory Visit: Payer: Self-pay

## 2023-12-30 ENCOUNTER — Other Ambulatory Visit: Payer: Self-pay | Admitting: Gastroenterology

## 2023-12-30 DIAGNOSIS — I5033 Acute on chronic diastolic (congestive) heart failure: Secondary | ICD-10-CM | POA: Diagnosis not present

## 2023-12-30 DIAGNOSIS — F411 Generalized anxiety disorder: Secondary | ICD-10-CM | POA: Diagnosis not present

## 2023-12-30 DIAGNOSIS — N189 Chronic kidney disease, unspecified: Secondary | ICD-10-CM | POA: Diagnosis not present

## 2023-12-30 DIAGNOSIS — F32A Depression, unspecified: Secondary | ICD-10-CM | POA: Diagnosis not present

## 2023-12-30 DIAGNOSIS — E1122 Type 2 diabetes mellitus with diabetic chronic kidney disease: Secondary | ICD-10-CM | POA: Diagnosis not present

## 2023-12-30 DIAGNOSIS — J441 Chronic obstructive pulmonary disease with (acute) exacerbation: Secondary | ICD-10-CM | POA: Diagnosis not present

## 2023-12-30 DIAGNOSIS — K219 Gastro-esophageal reflux disease without esophagitis: Secondary | ICD-10-CM

## 2023-12-30 DIAGNOSIS — I2510 Atherosclerotic heart disease of native coronary artery without angina pectoris: Secondary | ICD-10-CM | POA: Diagnosis not present

## 2023-12-30 DIAGNOSIS — I130 Hypertensive heart and chronic kidney disease with heart failure and stage 1 through stage 4 chronic kidney disease, or unspecified chronic kidney disease: Secondary | ICD-10-CM | POA: Diagnosis not present

## 2023-12-30 DIAGNOSIS — D649 Anemia, unspecified: Secondary | ICD-10-CM | POA: Diagnosis not present

## 2023-12-30 MED ORDER — OMEPRAZOLE 40 MG PO CPDR
40.0000 mg | DELAYED_RELEASE_CAPSULE | Freq: Two times a day (BID) | ORAL | 1 refills | Status: DC
Start: 2023-12-30 — End: 2024-02-23
  Filled 2023-12-30: qty 60, 30d supply, fill #0
  Filled 2024-01-29: qty 60, 30d supply, fill #1

## 2023-12-31 DIAGNOSIS — F411 Generalized anxiety disorder: Secondary | ICD-10-CM | POA: Diagnosis not present

## 2023-12-31 DIAGNOSIS — I130 Hypertensive heart and chronic kidney disease with heart failure and stage 1 through stage 4 chronic kidney disease, or unspecified chronic kidney disease: Secondary | ICD-10-CM | POA: Diagnosis not present

## 2023-12-31 DIAGNOSIS — E1122 Type 2 diabetes mellitus with diabetic chronic kidney disease: Secondary | ICD-10-CM | POA: Diagnosis not present

## 2023-12-31 DIAGNOSIS — I2510 Atherosclerotic heart disease of native coronary artery without angina pectoris: Secondary | ICD-10-CM | POA: Diagnosis not present

## 2023-12-31 DIAGNOSIS — D649 Anemia, unspecified: Secondary | ICD-10-CM | POA: Diagnosis not present

## 2023-12-31 DIAGNOSIS — J441 Chronic obstructive pulmonary disease with (acute) exacerbation: Secondary | ICD-10-CM | POA: Diagnosis not present

## 2023-12-31 DIAGNOSIS — N189 Chronic kidney disease, unspecified: Secondary | ICD-10-CM | POA: Diagnosis not present

## 2023-12-31 DIAGNOSIS — I5033 Acute on chronic diastolic (congestive) heart failure: Secondary | ICD-10-CM | POA: Diagnosis not present

## 2023-12-31 DIAGNOSIS — F32A Depression, unspecified: Secondary | ICD-10-CM | POA: Diagnosis not present

## 2024-01-01 NOTE — Telephone Encounter (Signed)
 advise

## 2024-01-02 ENCOUNTER — Telehealth: Payer: Self-pay

## 2024-01-02 DIAGNOSIS — F17210 Nicotine dependence, cigarettes, uncomplicated: Secondary | ICD-10-CM

## 2024-01-02 NOTE — Telephone Encounter (Signed)
 Please call pt and have her scheduled per Dr Diania Fortes. Needs to be virtual or an acute office visit.

## 2024-01-02 NOTE — Telephone Encounter (Signed)
 Dr. Diania Fortes, Patient has completed the starter pack for Chantix  and is requesting to continue the Chantix .  Please advise.

## 2024-01-05 ENCOUNTER — Other Ambulatory Visit (HOSPITAL_COMMUNITY): Payer: Self-pay

## 2024-01-05 ENCOUNTER — Other Ambulatory Visit: Payer: Self-pay

## 2024-01-05 MED ORDER — VARENICLINE TARTRATE 1 MG PO TABS
1.0000 mg | ORAL_TABLET | Freq: Two times a day (BID) | ORAL | 3 refills | Status: DC
  Filled 2024-01-05 (×2): qty 60, 30d supply, fill #0
  Filled 2024-01-28: qty 60, 30d supply, fill #1
  Filled 2024-02-27: qty 60, 30d supply, fill #2
  Filled 2024-03-28: qty 60, 30d supply, fill #3

## 2024-01-05 NOTE — Progress Notes (Signed)
 Cardiology Office Note    Patient Name: [name] [name] Date of Encounter: 01/05/2024  Primary Care Provider:  Vicci Barnie [name], MD Primary Cardiologist:  Debby Sor, MD Primary Electrophysiologist: None   Past Medical History    Past Medical History:  Diagnosis Date   Allergy    seasonal   Anemia    Anxiety    takes Lexapro  daily   Arthritis    back, from neck down pass my bra area (03/25/2017)   Asthma    Bartholin gland cyst 08/29/2011   Bruises easily    pt is on Effient    Chronic back pain    herniated nucleus pulposus   Chronic back pain    neck to bra area; lower back (03/25/2017)   Chronic kidney disease    recurrent UTI's this year 2022   COPD (chronic obstructive pulmonary disease) (HCC)    early stages   Coronary artery disease    Depression    takes Klonopin  daily   Diabetes mellitus without complication (HCC)    Diverticulosis    Fibroadenoma of left breast    GERD (gastroesophageal reflux disease)    takes Nexium  daily   H/O hiatal hernia    Heart attack (HCC) 2011   Hemorrhoids    Hernia    Hyperlipidemia    takes Lipitor  daily   Hypertension    takes Losartan  daily and Labetalol  bid   Hypothyroidism    takes Synthroid  daily   Insomnia    hydroxyzine  prn   Joint pain    Pneumonia    couple times (03/25/2017)   Pre-diabetes    just found out 1 wk ago (03/25/2017)   Psoriasis    elbows,knees,back   Shortness of breath    with exertion   Slowing of urinary stream    Stress incontinence     History of Present Illness  [name] [name] is a 68 y.o. female with a PMH of CAD in 2001 with s/p DES to RCA with staged intervention of proximal and mid LAD with BMS and Cutting Balloon arthrectomy to ostial diagonal, DES x 2 and overlapping RCA in 2011 HTN, HLD, prediabetes, polysubstance abuse, hypothyroidism, COPD presents today for 75-month follow-up.  [name] [name] has a significant history of CAD with initial LHC performed in 2001 with BMS  placed RCA and staged intervention with Cutting Balloon atherectomy to the ostial diagonal.  Repeat LHC in 2011 after developing in-stent restenosis of RCA with placement of overlapping DES x 2.  Completed last LHC in 2018 by Dr. Claudene that showed CTO of RCA due to ISR with left-to-right collaterals and diffuse 50% narrowing of the proximal LAD with diffuse 40-50% in-stent restenosis in the first diagonal, 70% mid OM2 with 50% proximal OM 3 stenosis.  2D echo was completed showing EF of 65% with aggressive medical therapy recommended.  A Myoview  in 11/2019 there was low risk with normal perfusion.  She was admitted on 05/2023 with COPD exacerbation.  2D echo was obtained showing EF of 60 to 65% with no RWMA.  He completed a PET/CT in 08/2023 that showed no evidence of ischemia or infarction and normal myocardial blood flow.  [name] [name] presents today for posthospital follow-up with her daughter. She was hospitalized last month for COPD exacerbation and congestive heart failure, experiencing swollen legs and significant fluid retention during her stay from May 13 to May 23. She was discharged with instructions to double her Lasix  dose from 20 mg to 40 mg daily,  which has helped manage her fluid retention. Her weight has stabilized between 153 to 155 pounds. She has difficulty breathing when lying flat and prefers to sleep on her side, using a pillow for support. No chest pain or palpitations currently. She has a history of chest tightness, which was relieved by nitroglycerin  administered during a previous hospitalization. Her blood pressure was low during her hospital stay, recorded at 90/50 mmHg, and she felt fatigued and dizzy. She is currently on Bystolic , a beta blocker, and her blood pressure has stabilized. She monitors her weight daily and maintains a stable weight. She is on Lasix  40 mg once daily. She also takes Brilinta  and aspirin  without any bleeding issues. She uses a pill box to manage her medications  but occasionally forgets to take them. She has a history of reflux, which occasionally bothers her. She drinks Dr. Nunzio but has been encouraged to drink more water. She is undergoing in-home physical therapy and has recently followed up with her pulmonologist. Patient denies chest pain, palpitations, dyspnea, PND, orthopnea, nausea, vomiting, dizziness, syncope, edema, weight gain, or early satiety.  Discussed the use of AI scribe software for clinical note transcription with the patient, who gave verbal consent to proceed.  History of Present Illness   Review of Systems  Please see the history of present illness.    All other systems reviewed and are otherwise negative except as noted above.  Physical Exam    Wt Readings from Last 3 Encounters:  12/11/23 155 lb (70.3 kg)  12/09/23 157 lb 3.2 oz (71.3 kg)  12/05/23 154 lb 10.4 oz (70.1 kg)   CD:Uyzmz were no vitals filed for this visit.,There is no height or weight on file to calculate BMI. GEN: Well nourished, well developed in no acute distress Neck: No JVD; No carotid bruits Pulmonary: Clear to auscultation without rales, wheezing or positive for rhonchi  Cardiovascular: Normal rate. Regular rhythm. Normal S1. Normal S2.   Murmurs: There is no murmur.  ABDOMEN: Soft, non-tender, non-distended EXTREMITIES:  No edema; No deformity   EKG/LABS/ Recent Cardiac Studies   ECG personally reviewed by me today -none completed today  Risk Assessment/Calculations:          Lab Results  Component Value Date   WBC 15.9 (H) 12/05/2023   HGB 11.3 (L) 12/05/2023   HCT 34.7 (L) 12/05/2023   MCV 96.1 12/05/2023   PLT 463 (H) 12/05/2023   Lab Results  Component Value Date   CREATININE 0.79 12/11/2023   BUN 12 12/11/2023   NA 143 12/11/2023   K 4.6 12/11/2023   CL 99 12/11/2023   CO2 27 12/11/2023   Lab Results  Component Value Date   CHOL 145 12/26/2023   HDL 79 12/26/2023   LDLCALC 48 12/26/2023   TRIG 100 12/26/2023    CHOLHDL 1.8 12/26/2023    Lab Results  Component Value Date   HGBA1C 6.4 (H) 12/11/2023   Assessment & Plan    Assessment & Plan  1.  History of CAD: - Previous LHC with multiple stents placed last ischemic evaluation completed by PET CT scan that showed no perfusion abnormalities. - Continue continue with ezetimibe  10 mg, Lipitor  80 mg, ASA 81 mg, as needed Nitrostat  0.4 mg, Bystolic  10 mg and Brilinta  60 mg twice daily - Patient encouraged to use as needed Nitrostat  and she did not seek care in the ED if chest pain is not resolved with as needed nitroglycerin .  2.  HFpEF: -Recent hospitalization for  heart failure and COPD exacerbation. Discharged with peripheral edema and fluid retention. Lasix  40 mg daily effectively managing fluid retention. Blood pressure stabilized. Renal function and electrolytes stable. - Continue Lasix  40 mg once daily. - Monitor weight daily. - Adjust Lasix  dosage: reduce to 20 mg if well-managed, increase to 60 mg if necessary. - Notify provider if Lasix  dose is increased. - Ensure adequate hydration, aiming for 64 ounces of water daily. - Continue current medications including Bystolic  and Brilinta . - Maintain low-sodium diet.  3.  History of COPD: -She was recently admitted for COPD exacerbation reports doing well with or shortness of breath present Continue current treatment plan per pulmonology - Continue in-home therapy.  4.  Hyperlipidemia: - Patient's last LDL cholesterol was 48 - Continue atorvastatin  80 mg and ezetimibe  10 mg  5.  DM type II: - Most recent hemoglobin A1c was 6.4 at goal - Continue current treatment plan per PCP  Disposition: Follow-up with Dr. Kriste or APP in 6 months   Signed, Wyn Raddle, Jackee Shove, NP 01/05/2024, 8:09 PM [facility]

## 2024-01-05 NOTE — Telephone Encounter (Signed)
 Script sent to the pharmacy.  JD

## 2024-01-06 ENCOUNTER — Encounter: Payer: Self-pay | Admitting: Nurse Practitioner

## 2024-01-06 ENCOUNTER — Ambulatory Visit: Attending: Nurse Practitioner | Admitting: Nurse Practitioner

## 2024-01-06 VITALS — BP 118/60 | HR 76 | Ht <= 58 in | Wt 154.6 lb

## 2024-01-06 DIAGNOSIS — N189 Chronic kidney disease, unspecified: Secondary | ICD-10-CM | POA: Diagnosis not present

## 2024-01-06 DIAGNOSIS — F411 Generalized anxiety disorder: Secondary | ICD-10-CM | POA: Diagnosis not present

## 2024-01-06 DIAGNOSIS — J449 Chronic obstructive pulmonary disease, unspecified: Secondary | ICD-10-CM

## 2024-01-06 DIAGNOSIS — F32A Depression, unspecified: Secondary | ICD-10-CM | POA: Diagnosis not present

## 2024-01-06 DIAGNOSIS — I2510 Atherosclerotic heart disease of native coronary artery without angina pectoris: Secondary | ICD-10-CM

## 2024-01-06 DIAGNOSIS — I10 Essential (primary) hypertension: Secondary | ICD-10-CM

## 2024-01-06 DIAGNOSIS — E119 Type 2 diabetes mellitus without complications: Secondary | ICD-10-CM | POA: Diagnosis not present

## 2024-01-06 DIAGNOSIS — E785 Hyperlipidemia, unspecified: Secondary | ICD-10-CM

## 2024-01-06 DIAGNOSIS — E1122 Type 2 diabetes mellitus with diabetic chronic kidney disease: Secondary | ICD-10-CM | POA: Diagnosis not present

## 2024-01-06 DIAGNOSIS — I5033 Acute on chronic diastolic (congestive) heart failure: Secondary | ICD-10-CM | POA: Diagnosis not present

## 2024-01-06 DIAGNOSIS — I130 Hypertensive heart and chronic kidney disease with heart failure and stage 1 through stage 4 chronic kidney disease, or unspecified chronic kidney disease: Secondary | ICD-10-CM | POA: Diagnosis not present

## 2024-01-06 DIAGNOSIS — D649 Anemia, unspecified: Secondary | ICD-10-CM | POA: Diagnosis not present

## 2024-01-06 DIAGNOSIS — J441 Chronic obstructive pulmonary disease with (acute) exacerbation: Secondary | ICD-10-CM | POA: Diagnosis not present

## 2024-01-06 NOTE — Patient Instructions (Signed)
 Medication Instructions:  Your physician recommends that you continue on your current medications as directed. Please refer to the Current Medication list given to you today.  *If you need a refill on your cardiac medications before your next appointment, please call your pharmacy*  Lab Work: None If you have labs (blood work) drawn today and your tests are completely normal, you will receive your results only by: MyChart Message (if you have MyChart) OR A paper copy in the mail If you have any lab test that is abnormal or we need to change your treatment, we will call you to review the results.  Testing/Procedures: None  Follow-Up: At [facility], you and your health needs are our priority.  As part of our continuing mission to provide you with exceptional heart care, our providers are all part of one team.  This team includes your primary Cardiologist (physician) and Advanced Practice Providers or APPs (Physician Assistants and Nurse Practitioners) who all work together to provide you with the care you need, when you need it.  Your next appointment:   6 month(s)  Provider:   Dr. Emeline Calender   We recommend signing up for the patient portal called MyChart.  Sign up information is provided on this After Visit Summary.  MyChart is used to connect with patients for Virtual Visits (Telemedicine).  Patients are able to view lab/test results, encounter notes, upcoming appointments, etc.  Non-urgent messages can be sent to your provider as well.   To learn more about what you can do with MyChart, go to [email or web address].   Other Instructions None

## 2024-01-07 ENCOUNTER — Ambulatory Visit (INDEPENDENT_AMBULATORY_CARE_PROVIDER_SITE_OTHER): Admitting: Pulmonary Disease

## 2024-01-07 ENCOUNTER — Other Ambulatory Visit (HOSPITAL_COMMUNITY): Payer: Self-pay

## 2024-01-07 ENCOUNTER — Encounter: Payer: Self-pay | Admitting: Pulmonary Disease

## 2024-01-07 VITALS — BP 117/74 | HR 112 | Ht 61.0 in | Wt 155.0 lb

## 2024-01-07 DIAGNOSIS — J4489 Other specified chronic obstructive pulmonary disease: Secondary | ICD-10-CM | POA: Diagnosis not present

## 2024-01-07 DIAGNOSIS — F17210 Nicotine dependence, cigarettes, uncomplicated: Secondary | ICD-10-CM

## 2024-01-07 MED ORDER — AZITHROMYCIN 250 MG PO TABS
250.0000 mg | ORAL_TABLET | Freq: Every day | ORAL | 5 refills | Status: DC
  Filled 2024-01-07 (×2): qty 30, 30d supply, fill #0

## 2024-01-07 MED ORDER — PREDNISONE 10 MG PO TABS
ORAL_TABLET | ORAL | 0 refills | Status: AC
  Filled 2024-01-07 (×2): qty 30, 12d supply, fill #0

## 2024-01-07 NOTE — Patient Instructions (Addendum)
 Start azithromycin  250mg , 1 tablet daily  Start prednisone  taper  Continue to work on quitting smoking using chantix  twice daily, patches and as needed lozenges  Continue trelegy inhaler 1 puff daily - rinse mouth out after each use  Start Ohtuvayre  nebulizer treatments twice daily  Continue albuterol  inhaler 1-2 puffs every 4-6 hours as needed  Follow up in 4 weeks, call sooner if needed

## 2024-01-07 NOTE — Progress Notes (Signed)
 Synopsis: Referred in January 2025 for hospital follow up for COPD exacerbation  Subjective:   PATIENT ID: [name] [name] GENDER: female DOB: [birth year], MRN: [id number]  HPI  Chief Complaint  Patient presents with   Acute Visit    Pt states sputum yellow to smokey color, increase O2 to 4 L    [name] [name] is a 68 year old woman, daily smoker with history of COPD, DMII, GERD, and CAD who is referred to pulmonary clinic for hospital follow up of COPD exacerbation.  OV 08/07/23 She was admitted 07/12/23 to 07/15/23 and 07/21/2023 to1/02/2024 for COPD exacerbations. She was seen by the inpatient pulmonary team during the December hospitalization. She was parainfluenza positive 06/14/23 and rhinovirus positive 07/23/23.   She  presents with increased mucus production described as thick and yucky. The patient reports difficulty in expectorating the mucus, which is yellow in color. The patient also mentions an increase in wheezing and the need to increase her oxygen  supplementation from 2 to 3 liters when moving around, as her oxygen  saturation drops to the low 70s. The patient's caregiver confirms this, noting that the patient's oxygen  saturation improves to above 88% with the increased oxygen  flow.  The patient continues to smoke about a pack of cigarettes per day, despite previous attempts to quit using Chantix  and nicotine  patches. The patient acknowledges the need to quit but expresses significant anxiety about the prospect, describing a strong craving for cigarettes and a sense of loss without them. The patient also mentions a history of mood disorders and is currently on clonazepam  and an unspecified mood medication.  The patient is currently on Trelegy for COPD management.. The patient also uses Ventolin  and a nebulizer with DuoNeb, though the frequency of use is inconsistent. The patient's caregiver is working on setting up reminders for the patient to use the nebulizer at least twice a  day.  The patient also mentions a recent fall, but no further details or related symptoms are provided. The patient lives alone in a condo and spends most of the day watching TV, with smoking breaks providing most of her physical activity. The patient's caregiver is involved and supportive, helping with medication reminders and attending medical appointments.  The patient has a history of lung cancer and is due for a follow-up scan, which was last done about six months ago. The patient also mentions a history of prednisone  use, but is not currently on it. The patient expresses concern about her memory, which she feels is worsening.  OV 09/29/23 She was admitted 3/5 to 3/13 for fall and pneumothorax. She was admitted to surgical service and chest tube was placed. Pneumothorax has resolved.   She continues to have cough, sputum production and dyspnea.   She has reduced her smoking to one cigarette a day, with her daughter limiting her access to cigarettes. She is trying to reduce her smoking further. She is currently on oxygen  therapy and questions the necessity of wearing it continuously, as she feels she can breathe without it.  She is residing in a rehab facility and undergoing physical therapy.   OV 12/09/23 She experiences bilateral leg swelling with a three-pound weight increase in a day. She was advised to double her Lasix  dose after a recent hospitalization, which initially reduced the swelling, but it has since returned. She takes Lasix  20 mg, amlodipine , losartan , and nebivolol  daily. Her blood pressure is low, with a recent reading of 99/60 mmHg.  She uses CPAP or BiPAP at night  and has previously used a BiPAP during a past hospital stay. She is awaiting a portable oxygen  concentrator. Her oxygen  needs are at the upper limit of her current concentrator's capacity.  She smokes about a pack of cigarettes a day, reduced from over a pack a day. She is preparing to start Chantix  for smoking  cessation and uses nicotine  pouches. She has smoked since age [age] and is concerned about weight gain after quitting.  She completed a course of steroids and antibiotics following her recent hospital discharge. She is not currently on any antibiotics or steroids.  OV 01/09/24 She has increased wheezing, cough, mucous production and dyspnea. She continues to smoke.     Past Medical History:  Diagnosis Date   Allergy    seasonal   Anemia    Anxiety    takes Lexapro  daily   Arthritis    back, from neck down pass my bra area (03/25/2017)   Asthma    Bartholin gland cyst 08/29/2011   Bruises easily    pt is on Effient    Chronic back pain    herniated nucleus pulposus   Chronic back pain    neck to bra area; lower back (03/25/2017)   Chronic kidney disease    recurrent UTI's this year 2022   COPD (chronic obstructive pulmonary disease) (HCC)    early stages   Coronary artery disease    Depression    takes Klonopin  daily   Diabetes mellitus without complication (HCC)    Diverticulosis    Fibroadenoma of left breast    GERD (gastroesophageal reflux disease)    takes Nexium  daily   H/O hiatal hernia    Heart attack (HCC) 2011   Hemorrhoids    Hernia    Hyperlipidemia    takes Lipitor  daily   Hypertension    takes Losartan  daily and Labetalol  bid   Hypothyroidism    takes Synthroid  daily   Insomnia    hydroxyzine  prn   Joint pain    Pneumonia    couple times (03/25/2017)   Pre-diabetes    just found out 1 wk ago (03/25/2017)   Psoriasis    elbows,knees,back   Shortness of breath    with exertion   Slowing of urinary stream    Stress incontinence      Family History  Problem Relation Age of Onset   Heart disease Mother    Hypertension Mother    Stroke Mother    Mental illness Mother    Heart disease Father    Hypertension Father    Diabetes Father    Colon polyps Brother    Heart disease Maternal Grandfather    Cancer Paternal Grandmother         mouth   Anesthesia problems Daughter    Breast cancer Maternal Aunt    Throat cancer Maternal Uncle    Thyroid  disease Paternal Aunt    Hypotension Neg Hx    Malignant hyperthermia Neg Hx    Pseudochol deficiency Neg Hx    Colon cancer Neg Hx    Stomach cancer Neg Hx    Esophageal cancer Neg Hx    Pancreatic cancer Neg Hx    Rectal cancer Neg Hx      Social History   Socioeconomic History   Marital status: Divorced    Spouse name: Not on file   Number of children: 1   Years of education: Not on file   Highest education level: 9th grade  Occupational History  Not on file  Tobacco Use   Smoking status: Every Day    Current packs/day: 0.50    Average packs/day: 0.5 packs/day for 50.0 years (25.0 ttl pk-yrs)    Types: Cigarettes   Smokeless tobacco: Never  Vaping Use   Vaping status: Some Days   Substances: Nicotine   Substance and Sexual Activity   Alcohol use: Yes    Comment: once a week   Drug use: Yes    Types: Marijuana    Comment: history of cocaine use, been about a year, per patient (12/18/21)   Sexual activity: Not Currently    Birth control/protection: Surgical  Other Topics Concern   Not on file  Social History Narrative   Not on file   Social Drivers of Health   Financial Resource Strain: Medium Risk (08/07/2023)   Overall Financial Resource Strain (CARDIA)    Difficulty of Paying Living Expenses: Somewhat hard  Food Insecurity: No Food Insecurity (12/04/2023)   Hunger Vital Sign    Worried About Running Out of Food in the Last Year: Never true    Ran Out of Food in the Last Year: Never true  Transportation Needs: No Transportation Needs (12/04/2023)   PRAPARE - Administrator, Civil Service (Medical): No    Lack of Transportation (Non-Medical): No  Physical Activity: Inactive (08/07/2023)   Exercise Vital Sign    Days of Exercise per Week: 0 days    Minutes of Exercise per Session: 0 min  Stress: No Stress Concern Present (08/07/2023)    [employer] of Occupational Health - Occupational Stress Questionnaire    Feeling of Stress : Only a little  Social Connections: Moderately Isolated (12/04/2023)   Social Connection and Isolation Panel    Frequency of Communication with Friends and Family: Twice a week    Frequency of Social Gatherings with Friends and Family: Never    Attends Religious Services: 1 to 4 times per year    Active Member of [employer] or Organizations: No    Attends Engineer, structural: 1 to 4 times per year    Marital Status: Never married  Intimate Partner Violence: Not At Risk (12/04/2023)   Humiliation, Afraid, Rape, and Kick questionnaire    Fear of Current or Ex-Partner: No    Emotionally Abused: No    Physically Abused: No    Sexually Abused: No     Allergies  Allergen Reactions   Avocado Anaphylaxis   Latex Shortness Of Breath and Rash   Codeine  Nausea Only     No facility-administered medications prior to visit.   Outpatient Medications Prior to Visit  Medication Sig Dispense Refill   albuterol  (VENTOLIN  HFA) 108 (90 Base) MCG/ACT inhaler INHALE 2 PUFFS into THE lungs EVERY 6 HOURS AS NEEDED wheezing AND SHORTNESS OF BREATH 54 g 1   alendronate  (FOSAMAX ) 70 MG tablet Take 1 tablet (70 mg total) by mouth every 7 (seven) days. Take with a full glass of water on an empty stomach. 12 tablet 1   aspirin  EC 81 MG tablet Take 81 mg by mouth in the morning.     atorvastatin  (LIPITOR ) 80 MG tablet Take 1 tablet (80 mg total) by mouth daily. 90 tablet 3   busPIRone  (BUSPAR ) 15 MG tablet Take 2 tablets (30 mg total) by mouth in the morning AND 1 tablet (15 mg total) daily at 12 noon AND 1 tablet (15 mg total) every evening. 360 tablet 0   clonazePAM  (KLONOPIN ) 0.5 MG tablet Take  1 tablet (0.5 mg total) by mouth 3 (three) times daily as needed for anxiety. (Patient taking differently: Take 0.5 mg by mouth 2 (two) times daily.) 90 tablet 0   doxepin  (SINEQUAN ) 75 MG capsule Take 1 capsule (75 mg  total) by mouth at bedtime. 90 capsule 0   escitalopram  (LEXAPRO ) 20 MG tablet Take 1 tablet (20 mg total) by mouth daily. 90 tablet 0   ezetimibe  (ZETIA ) 10 MG tablet Take 1 tablet (10 mg total) by mouth daily. 90 tablet 3   Fluticasone -Umeclidin-Vilant (TRELEGY ELLIPTA ) 200-62.5-25 MCG/ACT AEPB Inhale 1 puff into the lungs daily. (Patient taking differently: Inhale 1 puff into the lungs 2 (two) times daily.) 60 each 11   furosemide  (LASIX ) 40 MG tablet Take 1 tablet (40 mg total) by mouth daily. 90 tablet 1   gabapentin  (NEURONTIN ) 600 MG tablet Take 1 tablet (600 mg total) by mouth 2 (two) times daily. 180 tablet 2   ipratropium-albuterol  (DUONEB) 0.5-2.5 (3) MG/3ML SOLN Take 3 mLs by nebulization every 8 (eight) hours. 90 mL 1   levothyroxine  (SYNTHROID ) 112 MCG tablet Take 1 tablet (112 mcg total) by mouth daily. 90 tablet 2   metFORMIN  (GLUCOPHAGE ) 500 MG tablet Take 1 tablet (500 mg total) by mouth 2 (two) times daily with a meal. 180 tablet 1   mirtazapine  (REMERON ) 45 MG tablet Take 1 tablet (45 mg total) by mouth at bedtime. 90 tablet 0   montelukast  (SINGULAIR ) 10 MG tablet Take 1 tablet (10 mg total) by mouth at bedtime. 90 tablet 2   nebivolol  (BYSTOLIC ) 10 MG tablet Take 1 tablet (10 mg total) by mouth daily. 90 tablet 3   nitroGLYCERIN  (NITROSTAT ) 0.4 MG SL tablet Place 1 tablet (0.4 mg total) under the tongue every 5 (five) minutes as needed for chest pain. 25 tablet 3   omeprazole  (PRILOSEC) 40 MG capsule Take 1 capsule (40 mg total) by mouth 2 (two) times daily before a meal. 60 capsule 1   OXYGEN  Inhale 2-3 L/min into the lungs continuous.     ticagrelor  (BRILINTA ) 60 MG TABS tablet Take 1 tablet (60 mg total) by mouth 2 (two) times daily. 180 tablet 2   varenicline  (CHANTIX ) 1 MG tablet Take 1 tablet (1 mg total) by mouth 2 (two) times daily. 60 tablet 3    Review of Systems  Constitutional:  Negative for chills, fever, malaise/fatigue and weight loss.  HENT:  Negative for  congestion, sinus pain and sore throat.   Eyes: Negative.   Respiratory:  Positive for cough, sputum production, shortness of breath and wheezing. Negative for hemoptysis.   Cardiovascular:  Negative for chest pain, palpitations, orthopnea, claudication and leg swelling.  Gastrointestinal:  Negative for abdominal pain, heartburn, nausea and vomiting.  Genitourinary: Negative.   Musculoskeletal:  Negative for joint pain and myalgias.  Skin:  Negative for rash.  Neurological:  Negative for weakness.  Endo/Heme/Allergies: Negative.   Psychiatric/Behavioral:  The patient is nervous/anxious.     Objective:   Vitals:   01/07/24 1458  BP: 117/74  Pulse: (!) 112  SpO2: 96%  Weight: 155 lb (70.3 kg)  Height: 5' 1 (1.549 m)    Physical Exam Constitutional:      General: She is not in acute distress.    Appearance: Normal appearance.   Eyes:     General: No scleral icterus.    Conjunctiva/sclera: Conjunctivae normal.    Cardiovascular:     Rate and Rhythm: Normal rate and regular rhythm.  Pulmonary:  Breath sounds: Wheezing and rhonchi present. No rales.   Musculoskeletal:     Right lower leg: No edema.     Left lower leg: No edema.   Skin:    General: Skin is warm and dry.   Neurological:     General: No focal deficit present.    CBC    Component Value Date/Time   WBC 13.7 (H) 01/09/2024 0532   RBC 3.69 (L) 01/09/2024 0532   HGB 11.3 (L) 01/09/2024 0532   HGB 14.8 05/20/2023 1119   HCT 35.9 (L) 01/09/2024 0532   HCT 44.2 05/20/2023 1119   PLT 462 (H) 01/09/2024 0532   PLT 373 05/20/2023 1119   MCV 97.3 01/09/2024 0532   MCV 99 (H) 05/20/2023 1119   MCH 30.6 01/09/2024 0532   MCHC 31.5 01/09/2024 0532   RDW 13.9 01/09/2024 0532   RDW 13.2 05/20/2023 1119   LYMPHSABS 2.3 01/08/2024 2027   LYMPHSABS 2.6 04/05/2019 0926   MONOABS 1.1 (H) 01/08/2024 2027   EOSABS 0.0 01/08/2024 2027   EOSABS 0.3 04/05/2019 0926   BASOSABS 0.0 01/08/2024 2027   BASOSABS  0.1 04/05/2019 0926      Latest Ref Rng & Units 01/09/2024    5:32 AM 01/08/2024    8:27 PM 12/11/2023    4:48 PM  BMP  Glucose 70 - 99 mg/dL 763  846  90   BUN 8 - 23 mg/dL 14  14  12    Creatinine 0.44 - 1.00 mg/dL 9.12  9.13  9.20   BUN/Creat Ratio 12 - 28   15   Sodium 135 - 145 mmol/L 139  136  143   Potassium 3.5 - 5.1 mmol/L 3.4  3.4  4.6   Chloride 98 - 111 mmol/L 96  93  99   CO2 22 - 32 mmol/L 30  30  27    Calcium  8.9 - 10.3 mg/dL 9.5  9.2  9.6     Chest imaging: CXR 07/21/23 Cardiac shadow is within normal limits. Aortic calcifications are seen. The lungs are well aerated bilaterally. No focal infiltrate or sizable effusion is noted. No acute bony abnormality is noted. Old healed clavicular fracture is noted.  LCS CT Chest 02/07/23 1. Lung-RADS 3, probably benign findings. Short-term follow-up in 6 months is recommended with repeat low-dose chest CT without contrast (please use the following order, CT CHEST LCS NODULE FOLLOW-UP W/O CM). Three new scattered solid pulmonary nodules, largest 5.0 mm in the posterior left upper lobe. 2. Three-vessel coronary atherosclerosis. 3. Aortic Atherosclerosis (ICD10-I70.0) and Emphysema (ICD10-J43.9).  PFT:     No data to display          Labs:  Path:  Echo:  Heart Catheterization:     Assessment & Plan:   Asthma-COPD overlap syndrome (HCC) - Plan: predniSONE  (DELTASONE ) 10 MG tablet, azithromycin  (ZITHROMAX ) 250 MG tablet  Cigarette smoker  Discussion: [name] [name] is a 68 year old woman, daily smoker with history of COPD, DMII, GERD, and CAD who is referred to pulmonary clinic for hospital follow up of COPD exacerbation.  Chronic Obstructive Pulmonary Disease (COPD) - Continue Trelegy as maintenance therapy. - Add Ohtuvayre  nebulizer twice daily. - Start azithromycin  250 mg daily. - Prescribe prednisone  taper. - Monitor oxygen  use to avoid hypercapnia. - Encourage smoking cessation. - Follow up in four  weeks with EKG to monitor azithromycin  effects.  Nicotine  Dependence Continues to smoke despite cessation attempts. Smoking cessation critical for COPD management. Discussed smoking cessation for 3 minutes. -  Continue nicotine  patches and Chantix . - Encourage reduction in smoking with goal of cessation.  Chronic Respiratory Failure - continue supplemental oxygen   Congestive Heart Failure Bilateral leg swelling and recent weight gain indicate fluid retention. Low blood pressure complicates fluid management. Recent hospitalization led to doubling Lasix  dose, but hypotension remains a concern. - Hold amlodipine  and losartan  for two days. - Continue double dose of Lasix  (40 mg) for two days. - Monitor weight and blood pressure closely. - Consult primary care physician and cardiologist for long-term blood pressure management. - Order portable oxygen  concentrator.  Lung Nodule - continue to monitor.  Follow up in 4 weeks  [name] Chill, MD [facility] Office: [phone]  No current facility-administered medications for this visit.  Current Outpatient Medications:    azithromycin  (ZITHROMAX ) 250 MG tablet, Take 1 tablet (250 mg total) by mouth daily., Disp: 30 tablet, Rfl: 5   predniSONE  (DELTASONE ) 10 MG tablet, Take 4 tablets (40 mg total) by mouth daily with breakfast for 3 days, THEN 3 tablets (30 mg total) daily with breakfast for 3 days, THEN 2 tablets (20 mg total) daily with breakfast for 3 days, THEN 1 tablet (10 mg total) daily with breakfast for 3 days., Disp: 30 tablet, Rfl: 0   albuterol  (VENTOLIN  HFA) 108 (90 Base) MCG/ACT inhaler, INHALE 2 PUFFS into THE lungs EVERY 6 HOURS AS NEEDED wheezing AND SHORTNESS OF BREATH, Disp: 54 g, Rfl: 1   alendronate  (FOSAMAX ) 70 MG tablet, Take 1 tablet (70 mg total) by mouth every 7 (seven) days. Take with a full glass of water on an empty stomach., Disp: 12 tablet, Rfl: 1   aspirin  EC 81 MG tablet, Take 81 mg by  mouth in the morning., Disp: , Rfl:    atorvastatin  (LIPITOR ) 80 MG tablet, Take 1 tablet (80 mg total) by mouth daily., Disp: 90 tablet, Rfl: 3   busPIRone  (BUSPAR ) 15 MG tablet, Take 2 tablets (30 mg total) by mouth in the morning AND 1 tablet (15 mg total) daily at 12 noon AND 1 tablet (15 mg total) every evening., Disp: 360 tablet, Rfl: 0   clonazePAM  (KLONOPIN ) 0.5 MG tablet, Take 1 tablet (0.5 mg total) by mouth 3 (three) times daily as needed for anxiety. (Patient taking differently: Take 0.5 mg by mouth 2 (two) times daily.), Disp: 90 tablet, Rfl: 0   doxepin  (SINEQUAN ) 75 MG capsule, Take 1 capsule (75 mg total) by mouth at bedtime., Disp: 90 capsule, Rfl: 0   escitalopram  (LEXAPRO ) 20 MG tablet, Take 1 tablet (20 mg total) by mouth daily., Disp: 90 tablet, Rfl: 0   ezetimibe  (ZETIA ) 10 MG tablet, Take 1 tablet (10 mg total) by mouth daily., Disp: 90 tablet, Rfl: 3   Fluticasone -Umeclidin-Vilant (TRELEGY ELLIPTA ) 200-62.5-25 MCG/ACT AEPB, Inhale 1 puff into the lungs daily. (Patient taking differently: Inhale 1 puff into the lungs 2 (two) times daily.), Disp: 60 each, Rfl: 11   furosemide  (LASIX ) 40 MG tablet, Take 1 tablet (40 mg total) by mouth daily., Disp: 90 tablet, Rfl: 1   gabapentin  (NEURONTIN ) 600 MG tablet, Take 1 tablet (600 mg total) by mouth 2 (two) times daily., Disp: 180 tablet, Rfl: 2   ipratropium-albuterol  (DUONEB) 0.5-2.5 (3) MG/3ML SOLN, Take 3 mLs by nebulization every 8 (eight) hours., Disp: 90 mL, Rfl: 1   levothyroxine  (SYNTHROID ) 112 MCG tablet, Take 1 tablet (112 mcg total) by mouth daily., Disp: 90 tablet, Rfl: 2   metFORMIN  (GLUCOPHAGE ) 500 MG tablet, Take 1 tablet (  500 mg total) by mouth 2 (two) times daily with a meal., Disp: 180 tablet, Rfl: 1   mirtazapine  (REMERON ) 45 MG tablet, Take 1 tablet (45 mg total) by mouth at bedtime., Disp: 90 tablet, Rfl: 0   montelukast  (SINGULAIR ) 10 MG tablet, Take 1 tablet (10 mg total) by mouth at bedtime., Disp: 90 tablet, Rfl:  2   nebivolol  (BYSTOLIC ) 10 MG tablet, Take 1 tablet (10 mg total) by mouth daily., Disp: 90 tablet, Rfl: 3   nitroGLYCERIN  (NITROSTAT ) 0.4 MG SL tablet, Place 1 tablet (0.4 mg total) under the tongue every 5 (five) minutes as needed for chest pain., Disp: 25 tablet, Rfl: 3   omeprazole  (PRILOSEC) 40 MG capsule, Take 1 capsule (40 mg total) by mouth 2 (two) times daily before a meal., Disp: 60 capsule, Rfl: 1   OXYGEN , Inhale 2-3 L/min into the lungs continuous., Disp: , Rfl:    ticagrelor  (BRILINTA ) 60 MG TABS tablet, Take 1 tablet (60 mg total) by mouth 2 (two) times daily., Disp: 180 tablet, Rfl: 2   varenicline  (CHANTIX ) 1 MG tablet, Take 1 tablet (1 mg total) by mouth 2 (two) times daily., Disp: 60 tablet, Rfl: 3  Facility-Administered Medications Ordered in Other Visits:    albuterol  (PROVENTIL ) (2.5 MG/3ML) 0.083% nebulizer solution 2.5 mg, 2.5 mg, Inhalation, Q6H PRN, Garba, Mohammad L, MD   aspirin  EC tablet 81 mg, 81 mg, Oral, q AM, Garba, Mohammad L, MD, 81 mg at 01/09/24 9096   atorvastatin  (LIPITOR ) tablet 80 mg, 80 mg, Oral, q AM, Garba, Mohammad L, MD, 80 mg at 01/09/24 9096   azithromycin  (ZITHROMAX ) tablet 250 mg, 250 mg, Oral, Daily, Sim Re L, MD, 250 mg at 01/09/24 9097   budesonide -glycopyrrolate -formoterol  (BREZTRI ) 160-9-4.8 MCG/ACT inhaler 2 puff, 2 puff, Inhalation, BID, Garba, Mohammad L, MD   busPIRone  (BUSPAR ) tablet 30 mg, 30 mg, Oral, q AM **AND** busPIRone  (BUSPAR ) tablet 15 mg, 15 mg, Oral, Q1200, 15 mg at 01/09/24 1304 **AND** busPIRone  (BUSPAR ) tablet 15 mg, 15 mg, Oral, QPM, Garba, Mohammad L, MD   cefTRIAXone  (ROCEPHIN ) 1 g in sodium chloride  0.9 % 100 mL IVPB, 1 g, Intravenous, Q24H, Garba, Mohammad L, MD   clonazePAM  (KLONOPIN ) tablet 0.5 mg, 0.5 mg, Oral, TID PRN, Garba, Mohammad L, MD, 0.5 mg at 01/09/24 0031   doxepin  (SINEQUAN ) capsule 75 mg, 75 mg, Oral, QHS, Garba, Mohammad L, MD, 75 mg at 01/09/24 0109   enoxaparin  (LOVENOX ) injection 40 mg, 40  mg, Subcutaneous, Daily, Sim, Mohammad L, MD, 40 mg at 01/09/24 0901   escitalopram  (LEXAPRO ) tablet 20 mg, 20 mg, Oral, Daily, Garba, Mohammad L, MD   ezetimibe  (ZETIA ) tablet 10 mg, 10 mg, Oral, QHS, Garba, Mohammad L, MD, 10 mg at 01/09/24 0031   furosemide  (LASIX ) tablet 40 mg, 40 mg, Oral, Daily, Garba, Mohammad L, MD, 40 mg at 01/09/24 9096   gabapentin  (NEURONTIN ) capsule 600 mg, 600 mg, Oral, BID, Garba, Mohammad L, MD, 600 mg at 01/09/24 0903   insulin  aspart (novoLOG ) injection 0-15 Units, 0-15 Units, Subcutaneous, TID WC, Garba, Mohammad L, MD, 1 Units at 01/09/24 1305   insulin  aspart (novoLOG ) injection 0-5 Units, 0-5 Units, Subcutaneous, QHS, Garba, Mohammad L, MD, 5 Units at 01/09/24 0029   ipratropium-albuterol  (DUONEB) 0.5-2.5 (3) MG/3ML nebulizer solution 3 mL, 3 mL, Nebulization, Q8H, Garba, Mohammad L, MD, 3 mL at 01/09/24 1305   levothyroxine  (SYNTHROID ) tablet 112 mcg, 112 mcg, Oral, QAC breakfast, Sim Re CROME, MD, 112 mcg at 01/09/24 0903  metFORMIN  (GLUCOPHAGE ) tablet 500 mg, 500 mg, Oral, BID WC, Garba, Mohammad L, MD, 500 mg at 01/09/24 9096   methylPREDNISolone  sodium succinate (SOLU-MEDROL ) 125 mg/2 mL injection 80 mg, 80 mg, Intravenous, Daily **FOLLOWED BY** [START ON 01/10/2024] predniSONE  (DELTASONE ) tablet 40 mg, 40 mg, Oral, Q breakfast, Garba, Mohammad L, MD   mirtazapine  (REMERON ) tablet 45 mg, 45 mg, Oral, QHS, Garba, Mohammad L, MD, 45 mg at 01/09/24 0030   montelukast  (SINGULAIR ) tablet 10 mg, 10 mg, Oral, QHS, Garba, Mohammad L, MD, 10 mg at 01/09/24 0031   nebivolol  (BYSTOLIC ) tablet 10 mg, 10 mg, Oral, QHS, Garba, Mohammad L, MD, 10 mg at 01/09/24 0030   nicotine  (NICODERM CQ  - dosed in mg/24 hours) patch 21 mg, 21 mg, Transdermal, Daily, Garba, Mohammad L, MD, 21 mg at 01/09/24 9097   nitroGLYCERIN  (NITROSTAT ) SL tablet 0.4 mg, 0.4 mg, Sublingual, Q5 min PRN, Sim, Mohammad L, MD   pantoprazole  (PROTONIX ) EC tablet 40 mg, 40 mg, Oral, Daily, Garba,  Mohammad L, MD, 40 mg at 01/09/24 0902   ticagrelor  (BRILINTA ) tablet 60 mg, 60 mg, Oral, BID, Garba, Mohammad L, MD, 60 mg at 01/09/24 9096   varenicline  (CHANTIX ) tablet 1 mg, 1 mg, Oral, BID WC, Garba, Mohammad L, MD, 1 mg at 01/09/24 [phone]

## 2024-01-08 ENCOUNTER — Telehealth: Payer: Self-pay

## 2024-01-08 ENCOUNTER — Other Ambulatory Visit (HOSPITAL_COMMUNITY): Payer: Self-pay | Admitting: Physician Assistant

## 2024-01-08 ENCOUNTER — Encounter (HOSPITAL_COMMUNITY): Payer: Self-pay

## 2024-01-08 ENCOUNTER — Other Ambulatory Visit: Payer: Self-pay

## 2024-01-08 ENCOUNTER — Emergency Department (HOSPITAL_COMMUNITY)

## 2024-01-08 ENCOUNTER — Inpatient Hospital Stay (HOSPITAL_COMMUNITY)
Admission: EM | Admit: 2024-01-08 | Discharge: 2024-01-11 | DRG: 189 | Disposition: A | Discharge status: Home Health | Attending: Family Medicine | Admitting: Family Medicine

## 2024-01-08 ENCOUNTER — Other Ambulatory Visit (HOSPITAL_COMMUNITY): Payer: Self-pay

## 2024-01-08 DIAGNOSIS — Z8249 Family history of ischemic heart disease and other diseases of the circulatory system: Secondary | ICD-10-CM

## 2024-01-08 DIAGNOSIS — F419 Anxiety disorder, unspecified: Secondary | ICD-10-CM | POA: Diagnosis present

## 2024-01-08 DIAGNOSIS — E1122 Type 2 diabetes mellitus with diabetic chronic kidney disease: Secondary | ICD-10-CM | POA: Diagnosis present

## 2024-01-08 DIAGNOSIS — I2510 Atherosclerotic heart disease of native coronary artery without angina pectoris: Secondary | ICD-10-CM | POA: Diagnosis not present

## 2024-01-08 DIAGNOSIS — I252 Old myocardial infarction: Secondary | ICD-10-CM

## 2024-01-08 DIAGNOSIS — Z955 Presence of coronary angioplasty implant and graft: Secondary | ICD-10-CM | POA: Diagnosis not present

## 2024-01-08 DIAGNOSIS — E876 Hypokalemia: Secondary | ICD-10-CM | POA: Diagnosis present

## 2024-01-08 DIAGNOSIS — Z79890 Hormone replacement therapy (postmenopausal): Secondary | ICD-10-CM | POA: Diagnosis not present

## 2024-01-08 DIAGNOSIS — Z833 Family history of diabetes mellitus: Secondary | ICD-10-CM | POA: Diagnosis not present

## 2024-01-08 DIAGNOSIS — Z1152 Encounter for screening for COVID-19: Secondary | ICD-10-CM

## 2024-01-08 DIAGNOSIS — Z79899 Other long term (current) drug therapy: Secondary | ICD-10-CM | POA: Diagnosis not present

## 2024-01-08 DIAGNOSIS — R0602 Shortness of breath: Secondary | ICD-10-CM | POA: Diagnosis present

## 2024-01-08 DIAGNOSIS — Z818 Family history of other mental and behavioral disorders: Secondary | ICD-10-CM

## 2024-01-08 DIAGNOSIS — I129 Hypertensive chronic kidney disease with stage 1 through stage 4 chronic kidney disease, or unspecified chronic kidney disease: Secondary | ICD-10-CM | POA: Diagnosis present

## 2024-01-08 DIAGNOSIS — E1165 Type 2 diabetes mellitus with hyperglycemia: Secondary | ICD-10-CM | POA: Diagnosis present

## 2024-01-08 DIAGNOSIS — J441 Chronic obstructive pulmonary disease with (acute) exacerbation: Secondary | ICD-10-CM | POA: Diagnosis not present

## 2024-01-08 DIAGNOSIS — Z91040 Latex allergy status: Secondary | ICD-10-CM

## 2024-01-08 DIAGNOSIS — N189 Chronic kidney disease, unspecified: Secondary | ICD-10-CM | POA: Diagnosis not present

## 2024-01-08 DIAGNOSIS — I130 Hypertensive heart and chronic kidney disease with heart failure and stage 1 through stage 4 chronic kidney disease, or unspecified chronic kidney disease: Secondary | ICD-10-CM | POA: Diagnosis not present

## 2024-01-08 DIAGNOSIS — F3342 Major depressive disorder, recurrent, in full remission: Secondary | ICD-10-CM | POA: Diagnosis present

## 2024-01-08 DIAGNOSIS — F411 Generalized anxiety disorder: Secondary | ICD-10-CM | POA: Diagnosis not present

## 2024-01-08 DIAGNOSIS — Z7951 Long term (current) use of inhaled steroids: Secondary | ICD-10-CM

## 2024-01-08 DIAGNOSIS — Z7984 Long term (current) use of oral hypoglycemic drugs: Secondary | ICD-10-CM

## 2024-01-08 DIAGNOSIS — J9811 Atelectasis: Secondary | ICD-10-CM | POA: Diagnosis not present

## 2024-01-08 DIAGNOSIS — R062 Wheezing: Secondary | ICD-10-CM | POA: Diagnosis not present

## 2024-01-08 DIAGNOSIS — Z90710 Acquired absence of both cervix and uterus: Secondary | ICD-10-CM

## 2024-01-08 DIAGNOSIS — E785 Hyperlipidemia, unspecified: Secondary | ICD-10-CM | POA: Diagnosis present

## 2024-01-08 DIAGNOSIS — Z823 Family history of stroke: Secondary | ICD-10-CM

## 2024-01-08 DIAGNOSIS — F17200 Nicotine dependence, unspecified, uncomplicated: Secondary | ICD-10-CM | POA: Diagnosis present

## 2024-01-08 DIAGNOSIS — N182 Chronic kidney disease, stage 2 (mild): Secondary | ICD-10-CM | POA: Diagnosis present

## 2024-01-08 DIAGNOSIS — Z9981 Dependence on supplemental oxygen: Secondary | ICD-10-CM | POA: Diagnosis not present

## 2024-01-08 DIAGNOSIS — Z7983 Long term (current) use of bisphosphonates: Secondary | ICD-10-CM

## 2024-01-08 DIAGNOSIS — Z803 Family history of malignant neoplasm of breast: Secondary | ICD-10-CM

## 2024-01-08 DIAGNOSIS — K219 Gastro-esophageal reflux disease without esophagitis: Secondary | ICD-10-CM | POA: Diagnosis present

## 2024-01-08 DIAGNOSIS — E039 Hypothyroidism, unspecified: Secondary | ICD-10-CM | POA: Diagnosis present

## 2024-01-08 DIAGNOSIS — I5033 Acute on chronic diastolic (congestive) heart failure: Secondary | ICD-10-CM | POA: Diagnosis not present

## 2024-01-08 DIAGNOSIS — Z8679 Personal history of other diseases of the circulatory system: Secondary | ICD-10-CM

## 2024-01-08 DIAGNOSIS — R0600 Dyspnea, unspecified: Secondary | ICD-10-CM | POA: Diagnosis not present

## 2024-01-08 DIAGNOSIS — R0689 Other abnormalities of breathing: Secondary | ICD-10-CM | POA: Diagnosis not present

## 2024-01-08 DIAGNOSIS — Z7902 Long term (current) use of antithrombotics/antiplatelets: Secondary | ICD-10-CM

## 2024-01-08 DIAGNOSIS — F17210 Nicotine dependence, cigarettes, uncomplicated: Secondary | ICD-10-CM | POA: Diagnosis present

## 2024-01-08 DIAGNOSIS — S42023A Displaced fracture of shaft of unspecified clavicle, initial encounter for closed fracture: Secondary | ICD-10-CM | POA: Diagnosis not present

## 2024-01-08 DIAGNOSIS — Z885 Allergy status to narcotic agent status: Secondary | ICD-10-CM

## 2024-01-08 DIAGNOSIS — J9621 Acute and chronic respiratory failure with hypoxia: Secondary | ICD-10-CM | POA: Diagnosis present

## 2024-01-08 DIAGNOSIS — F32A Depression, unspecified: Secondary | ICD-10-CM | POA: Diagnosis not present

## 2024-01-08 DIAGNOSIS — D649 Anemia, unspecified: Secondary | ICD-10-CM | POA: Diagnosis not present

## 2024-01-08 DIAGNOSIS — D72829 Elevated white blood cell count, unspecified: Secondary | ICD-10-CM | POA: Diagnosis present

## 2024-01-08 DIAGNOSIS — Z808 Family history of malignant neoplasm of other organs or systems: Secondary | ICD-10-CM

## 2024-01-08 DIAGNOSIS — I10 Essential (primary) hypertension: Secondary | ICD-10-CM | POA: Diagnosis present

## 2024-01-08 LAB — CBC WITH DIFFERENTIAL/PLATELET
Abs Immature Granulocytes: 0.06 10*3/uL (ref 0.00–0.07)
Basophils Absolute: 0 10*3/uL (ref 0.0–0.1)
Basophils Relative: 0 %
Eosinophils Absolute: 0 10*3/uL (ref 0.0–0.5)
Eosinophils Relative: 0 %
HCT: 38 % (ref 36.0–46.0)
Hemoglobin: 12 g/dL (ref 12.0–15.0)
Immature Granulocytes: 1 %
Lymphocytes Relative: 17 %
Lymphs Abs: 2.3 10*3/uL (ref 0.7–4.0)
MCH: 30.5 pg (ref 26.0–34.0)
MCHC: 31.6 g/dL (ref 30.0–36.0)
MCV: 96.7 fL (ref 80.0–100.0)
Monocytes Absolute: 1.1 10*3/uL — ABNORMAL HIGH (ref 0.1–1.0)
Monocytes Relative: 8 %
Neutro Abs: 9.8 10*3/uL — ABNORMAL HIGH (ref 1.7–7.7)
Neutrophils Relative %: 74 %
Platelets: 459 10*3/uL — ABNORMAL HIGH (ref 150–400)
RBC: 3.93 MIL/uL (ref 3.87–5.11)
RDW: 13.8 % (ref 11.5–15.5)
WBC: 13.3 10*3/uL — ABNORMAL HIGH (ref 4.0–10.5)
nRBC: 0 % (ref 0.0–0.2)

## 2024-01-08 LAB — BLOOD GAS, VENOUS
Acid-Base Excess: 11.6 mmol/L — ABNORMAL HIGH (ref 0.0–2.0)
Bicarbonate: 38 mmol/L — ABNORMAL HIGH (ref 20.0–28.0)
O2 Saturation: 83.1 %
Patient temperature: 37
pCO2, Ven: 56 mmHg (ref 44–60)
pH, Ven: 7.44 — ABNORMAL HIGH (ref 7.25–7.43)
pO2, Ven: 46 mmHg — ABNORMAL HIGH (ref 32–45)

## 2024-01-08 LAB — COMPREHENSIVE METABOLIC PANEL WITH GFR
ALT: 17 U/L (ref 0–44)
AST: 18 U/L (ref 15–41)
Albumin: 3.4 g/dL — ABNORMAL LOW (ref 3.5–5.0)
Alkaline Phosphatase: 72 U/L (ref 38–126)
Anion gap: 13 (ref 5–15)
BUN: 14 mg/dL (ref 8–23)
CO2: 30 mmol/L (ref 22–32)
Calcium: 9.2 mg/dL (ref 8.9–10.3)
Chloride: 93 mmol/L — ABNORMAL LOW (ref 98–111)
Creatinine, Ser: 0.86 mg/dL (ref 0.44–1.00)
GFR, Estimated: >60 mL/min (ref >60)
Glucose, Bld: 153 mg/dL — ABNORMAL HIGH (ref 70–99)
Potassium: 3.4 mmol/L — ABNORMAL LOW (ref 3.5–5.1)
Sodium: 136 mmol/L (ref 135–145)
Total Bilirubin: 0.4 mg/dL (ref 0.0–1.2)
Total Protein: 6.6 g/dL (ref 6.5–8.1)

## 2024-01-08 LAB — RESP PANEL BY RT-PCR (RSV, FLU A&B, COVID)  RVPGX2
Influenza A by PCR: NEGATIVE
Influenza B by PCR: NEGATIVE
Resp Syncytial Virus by PCR: NEGATIVE
SARS Coronavirus 2 by RT PCR: NEGATIVE

## 2024-01-08 LAB — MAGNESIUM: Magnesium: 1.7 mg/dL (ref 1.7–2.4)

## 2024-01-08 LAB — TROPONIN I (HIGH SENSITIVITY)
Troponin I (High Sensitivity): 6 ng/L (ref <18)
Troponin I (High Sensitivity): 6 ng/L (ref <18)

## 2024-01-08 MED ORDER — ALBUTEROL (5 MG/ML) CONTINUOUS INHALATION SOLN
10.0000 mg | INHALATION_SOLUTION | Freq: Once | RESPIRATORY_TRACT | Status: DC
  Filled 2024-01-08: qty 20

## 2024-01-08 MED ORDER — MAGNESIUM SULFATE 2 GM/50ML IV SOLN
2.0000 g | Freq: Once | INTRAVENOUS | Status: AC
  Administered 2024-01-08: 2 g via INTRAVENOUS
  Filled 2024-01-08: qty 50

## 2024-01-08 MED ORDER — METHYLPREDNISOLONE SODIUM SUCC 125 MG IJ SOLR: 125.0000 mg | Freq: Once | INTRAMUSCULAR | Status: DC

## 2024-01-08 MED ORDER — ALBUTEROL SULFATE (2.5 MG/3ML) 0.083% IN NEBU
10.0000 mg | INHALATION_SOLUTION | Freq: Once | RESPIRATORY_TRACT | Status: AC
  Administered 2024-01-08: 10 mg via RESPIRATORY_TRACT

## 2024-01-08 MED ORDER — SODIUM CHLORIDE 0.9 % IV SOLN
1.0000 g | Freq: Once | INTRAVENOUS | Status: AC
  Administered 2024-01-08: 1 g via INTRAVENOUS
  Filled 2024-01-08: qty 10

## 2024-01-08 NOTE — ED Notes (Signed)
 Pt found desating into the low 80s high 70s. Pt placed on 6L [place]. Now sating at 95% [place].

## 2024-01-08 NOTE — Telephone Encounter (Signed)
 Received [place] new start paperwork. Completed form and faxed with clinicals and insurance card copy to [facility]   Phone#: [phone] Fax#: [phone]

## 2024-01-08 NOTE — H&P (Signed)
 History and Physical    Patient: [name] [name] FMW:[id number] DOB: 1956/03/02 DOA: 01/08/2024 DOS: the patient was seen and examined on 01/08/2024 PCP: Vicci Barnie [name], MD  Patient coming from: Home  Chief Complaint:  Chief Complaint  Patient presents with   Shortness of Breath   HPI: [name] [name] is a 67 y.o. female with medical history significant of COPD, coronary artery disease, chronic kidney disease stage II, non-insulin -dependent diabetes, hypothyroidism, essential hypertension, hyperlipidemia, history of lung nodules, GERD, and multiple other medical problems who is on home oxygen  at 2 to 3 L/min.  Patient is a lifelong tobacco smoker.  Has been on oxygen  but only formally diagnosed with pulmonary testing yesterday.  She is taking her inhalers and inhaled steroids at home but lost power.  Patient started having progressive shortness of breath yesterday with wheezing.  Unable to use oxygen  since she had no power.  She came to the ER where she was found to be hypoxic requiring more oxygen  than her usual 2 to 3 L.  Also diffuse expiratory wheezing.  Mild hypokalemia.  Patient being admitted with acute exacerbation of COPD  Review of Systems: As mentioned in the history of present illness. All other systems reviewed and are negative. Past Medical History:  Diagnosis Date   Allergy    seasonal   Anemia    Anxiety    takes Lexapro  daily   Arthritis    back, from neck down pass my bra area (03/25/2017)   Asthma    Bartholin gland cyst 08/29/2011   Bruises easily    pt is on Effient    Chronic back pain    herniated nucleus pulposus   Chronic back pain    neck to bra area; lower back (03/25/2017)   Chronic kidney disease    recurrent UTI's this year 2022   COPD (chronic obstructive pulmonary disease) (HCC)    early stages   Coronary artery disease    Depression    takes Klonopin  daily   Diabetes mellitus without complication (HCC)    Diverticulosis    Fibroadenoma of  left breast    GERD (gastroesophageal reflux disease)    takes Nexium  daily   H/O hiatal hernia    Heart attack (HCC) 2011   Hemorrhoids    Hernia    Hyperlipidemia    takes Lipitor  daily   Hypertension    takes Losartan  daily and Labetalol  bid   Hypothyroidism    takes Synthroid  daily   Insomnia    hydroxyzine  prn   Joint pain    Pneumonia    couple times (03/25/2017)   Pre-diabetes    just found out 1 wk ago (03/25/2017)   Psoriasis    elbows,knees,back   Shortness of breath    with exertion   Slowing of urinary stream    Stress incontinence    Past Surgical History:  Procedure Laterality Date   ABDOMINAL EXPLORATION SURGERY  1977   ABDOMINAL HYSTERECTOMY  1977   left one of my ovaries   BACK SURGERY     BIOPSY  05/27/2019   Procedure: BIOPSY;  Surgeon: Shaaron Lamar HERO, MD;  Location: [facility];  Service: Endoscopy;;   BREAST BIOPSY Left 11/2021   x2   COLONOSCOPY  2011   Dr. Teressa: Mild diverticulosis, descending diminutive colon polyp (not retrieved), next colonoscopy 10 years   CORONARY ANGIOPLASTY WITH STENT PLACEMENT  2011 X2   regular stents didn't work; had to go back in in ~  1 month and put in medicated stents   DILATION AND CURETTAGE OF UTERUS     ESOPHAGOGASTRODUODENOSCOPY     ESOPHAGOGASTRODUODENOSCOPY (EGD) WITH PROPOFOL  N/A 05/27/2019   normal esophagus, dilation, erosive gastropathy s/p biopsy, normal duodenum. Negative H.pylori.    LEFT HEART CATH AND CORONARY ANGIOGRAPHY N/A 03/26/2017   Procedure: LEFT HEART CATH AND CORONARY ANGIOGRAPHY;  Surgeon: Claudene Victory ORN, MD;  Location: [facility];  Service: Cardiovascular;  Laterality: N/A;   LUMBAR LAMINECTOMY/DECOMPRESSION MICRODISCECTOMY  08/05/2011   Procedure: LUMBAR LAMINECTOMY/DECOMPRESSION MICRODISCECTOMY;  Surgeon: Lynwood JONELLE Mill, MD;  Location: [facility];  Service: Neurosurgery;  Laterality: Right;  Right Lumbar four-five extraforaminal discectomy   MALONEY DILATION N/A  05/27/2019   Procedure: AGAPITO DILATION;  Surgeon: Shaaron Lamar HERO, MD;  Location: [facility];  Service: Endoscopy;  Laterality: N/A;  54   SHOULDER ARTHROSCOPY WITH ROTATOR CUFF REPAIR AND OPEN BICEPS TENODESIS Right 12/24/2021   Procedure: RIGHT SHOULDER ARTHROSCOPY WITH ROTATOR CUFF REPAIR AND BICEPS TENODESIS;  Surgeon: Barbarann Oneil BROCKS, MD;  Location: [facility];  Service: Orthopedics;  Laterality: Right;   TONSILLECTOMY     as a child   UPPER GASTROINTESTINAL ENDOSCOPY     Social History:  reports that she has been smoking cigarettes. She has a 25 pack-year smoking history. She has never used smokeless tobacco. She reports current alcohol use. She reports current drug use. Drug: Marijuana.  Allergies  Allergen Reactions   Avocado Anaphylaxis   Latex Shortness Of Breath and Rash   Codeine  Nausea Only    Family History  Problem Relation Age of Onset   Heart disease Mother    Hypertension Mother    Stroke Mother    Mental illness Mother    Heart disease Father    Hypertension Father    Diabetes Father    Colon polyps Brother    Heart disease Maternal Grandfather    Cancer Paternal Grandmother        mouth   Anesthesia problems Daughter    Breast cancer Maternal Aunt    Throat cancer Maternal Uncle    Thyroid  disease Paternal Aunt    Hypotension Neg Hx    Malignant hyperthermia Neg Hx    Pseudochol deficiency Neg Hx    Colon cancer Neg Hx    Stomach cancer Neg Hx    Esophageal cancer Neg Hx    Pancreatic cancer Neg Hx    Rectal cancer Neg Hx     Prior to Admission medications   Medication Sig Start Date End Date Taking? Authorizing Provider  albuterol  (VENTOLIN  HFA) 108 (90 Base) MCG/ACT inhaler INHALE 2 PUFFS into THE lungs EVERY 6 HOURS AS NEEDED wheezing AND SHORTNESS OF BREATH Patient taking differently: Inhale 2 puffs into the lungs every 6 (six) hours as needed for shortness of breath or wheezing. 10/15/23   Vicci Barnie NOVAK, MD  alendronate  (FOSAMAX ) 70 MG tablet  Take 1 tablet (70 mg total) by mouth every 7 (seven) days. Take with a full glass of water on an empty stomach. 10/25/23   Vicci Barnie NOVAK, MD  aspirin  EC 81 MG tablet Take 81 mg by mouth in the morning.    [provider]  atorvastatin  (LIPITOR ) 80 MG tablet Take 1 tablet (80 mg total) by mouth daily. Patient taking differently: Take 80 mg by mouth in the morning. 09/22/23   Daneen Damien BROCKS, NP  azithromycin  (ZITHROMAX ) 250 MG tablet Take 1 tablet (250 mg total) by mouth daily. 01/07/24  Kara Dorn NOVAK, MD  busPIRone  (BUSPAR ) 15 MG tablet Take 2 tablets (30 mg total) by mouth in the morning AND 1 tablet (15 mg total) daily at 12 noon AND 1 tablet (15 mg total) every evening. 11/17/23   Nwoko, Uchenna E, PA  clonazePAM  (KLONOPIN ) 0.5 MG tablet Take 1 tablet (0.5 mg total) by mouth 3 (three) times daily as needed for anxiety. Patient taking differently: Take 0.5 mg by mouth in the morning and at bedtime. 09/26/23   Nwoko, Uchenna E, PA  doxepin  (SINEQUAN ) 75 MG capsule Take 1 capsule (75 mg total) by mouth at bedtime. 11/17/23   Nwoko, Uchenna E, PA  escitalopram  (LEXAPRO ) 20 MG tablet Take 1 tablet (20 mg total) by mouth daily. 11/17/23   Nwoko, Uchenna E, PA  ezetimibe  (ZETIA ) 10 MG tablet Take 1 tablet (10 mg total) by mouth daily. Patient taking differently: Take 10 mg by mouth at bedtime. 07/04/23   Meng, Hao, PA  Fluticasone -Umeclidin-Vilant (TRELEGY ELLIPTA ) 200-62.5-25 MCG/ACT AEPB Inhale 1 puff into the lungs daily. Patient taking differently: Inhale 1 puff into the lungs in the morning. 08/07/23   Kara Dorn NOVAK, MD  furosemide  (LASIX ) 40 MG tablet Take 1 tablet (40 mg total) by mouth daily. 12/11/23   Vicci Barnie NOVAK, MD  gabapentin  (NEURONTIN ) 600 MG tablet Take 1 tablet (600 mg total) by mouth 2 (two) times daily. 12/04/23   Vicci Barnie NOVAK, MD  ipratropium-albuterol  (DUONEB) 0.5-2.5 (3) MG/3ML SOLN Take 3 mLs by nebulization every 8 (eight) hours. 11/27/23   Rosan Dayton BROCKS,  DO  levothyroxine  (SYNTHROID ) 112 MCG tablet Take 1 tablet (112 mcg total) by mouth daily. Patient taking differently: Take 112 mcg by mouth daily before breakfast. 12/20/22   Vicci Barnie NOVAK, MD  metFORMIN  (GLUCOPHAGE ) 500 MG tablet Take 1 tablet (500 mg total) by mouth 2 (two) times daily with a meal. 11/12/23   Vicci Barnie NOVAK, MD  mirtazapine  (REMERON ) 45 MG tablet Take 1 tablet (45 mg total) by mouth at bedtime. 11/17/23   Nwoko, Uchenna E, PA  montelukast  (SINGULAIR ) 10 MG tablet Take 1 tablet (10 mg total) by mouth at bedtime. 11/11/23   Vicci Barnie NOVAK, MD  nebivolol  (BYSTOLIC ) 10 MG tablet Take 1 tablet (10 mg total) by mouth daily. Patient taking differently: Take 10 mg by mouth at bedtime. 09/22/23   Daneen Damien BROCKS, NP  nitroGLYCERIN  (NITROSTAT ) 0.4 MG SL tablet Place 1 tablet (0.4 mg total) under the tongue every 5 (five) minutes as needed for chest pain. 09/18/22 08/31/24  Daneen Damien BROCKS, NP  omeprazole  (PRILOSEC) 40 MG capsule Take 1 capsule (40 mg total) by mouth 2 (two) times daily before a meal. 12/30/23   Danis, Victory LITTIE MOULD, MD  OXYGEN  Inhale 2 L/min into the lungs continuous.    [provider]  predniSONE  (DELTASONE ) 10 MG tablet Take 4 tablets (40 mg total) by mouth daily with breakfast for 3 days, THEN 3 tablets (30 mg total) daily with breakfast for 3 days, THEN 2 tablets (20 mg total) daily with breakfast for 3 days, THEN 1 tablet (10 mg total) daily with breakfast for 3 days. 01/07/24 01/19/24  Kara Dorn NOVAK, MD  ticagrelor  (BRILINTA ) 60 MG TABS tablet Take 1 tablet (60 mg total) by mouth 2 (two) times daily. 12/11/23   Burnard Debby LABOR, MD  varenicline  (CHANTIX ) 1 MG tablet Take 1 tablet (1 mg total) by mouth 2 (two) times daily. 01/05/24   Kara Dorn NOVAK, MD  Physical Exam: Vitals:   01/08/24 1954 01/08/24 1955  BP: 132/72   Pulse: 89   Resp: (!) 22   Temp: 98.1 F (36.7 C)   SpO2: 100%   Weight:  70.3 kg  Height:  5' 1 (1.549 m)   Constitutional:  Acutely ill looking, anxious NAD, comfortable Eyes: PERRL, lids and conjunctivae normal ENMT: Mucous membranes are moist. Posterior pharynx clear of any exudate or lesions.Normal dentition.  Neck: normal, supple, no masses, no thyromegaly Respiratory: Decreased air entry bilateral with mild expiratory wheezing, no crackles. Normal respiratory effort. No accessory muscle use.  Cardiovascular: Regular rate and rhythm, no murmurs / rubs / gallops. No extremity edema. 2+ pedal pulses. No carotid bruits.  Abdomen: no tenderness, no masses palpated. No hepatosplenomegaly. Bowel sounds positive.  Musculoskeletal: Good range of motion, no joint swelling or tenderness, Skin: no rashes, lesions, ulcers. No induration Neurologic: CN 2-12 grossly intact. Sensation intact, DTR normal. Strength 5/5 in all 4.  Psychiatric: Normal judgment and insight. Alert and oriented x 3.  Anxious mood  Data Reviewed:  Temperature 98.1 blood pressure 130/72, pulse is 112.  Oxygen  sats 96% on 4 L venous pH 7.44.  Potassium of 3.4 chloride 93 glucose 153.  Albumin 3.4 white count 13.3.  Acute viral screen is negative for flu RSV and COVID-19.  Chest x-ray showed no acute findings.  EKG chest shows sinus rhythm.  Assessment and Plan:  #1 acute on chronic heart respiratory failure with hypoxia: Secondary to COPD exacerbation.  Probably exacerbated by patient's inability use oxygen  at home.  Patient will be admitted and follow the COPD guideline with IV steroid, antibiotics, breathing treatments.  Patient back on oxygen  at 3 to 4 L.  Titrate her down to 5 2 L.  #2 tobacco abuse: Nicotine  patch will be added.  #3 non-insulin -dependent diabetes: Initiate sliding scale insulin .  #4 hypothyroidism: Continue levothyroxine   #5 hyperlipidemia: Continue with statin  #6 essential hypertension: Continue home blood pressure medications  #7 anxiety with depression: Continue Klonopin  and other home regimen  #8 history of coronary  artery disease: Stable no decompensation.    Advance Care Planning:   Code Status: Full Code   Consults: None  Family Communication: No family at bedside  Severity of Illness: The appropriate patient status for this patient is OBSERVATION. Observation status is judged to be reasonable and necessary in order to provide the required intensity of service to ensure the patient's safety. The patient's presenting symptoms, physical exam findings, and initial radiographic and laboratory data in the context of their medical condition is felt to place them at decreased risk for further clinical deterioration. Furthermore, it is anticipated that the patient will be medically stable for discharge from the hospital within 2 midnights of admission.   AuthorBETHA SIM KNOLL, MD 01/08/2024 10:55 PM  For on call review [email or web address].

## 2024-01-08 NOTE — ED Triage Notes (Signed)
 PT brought in by EMS, PT states she was diagnosed with aspiration PNE yesterday and placed on home O2 PT states he power wen out and she had no O2 so she called EMS. PT is alert and talking and state she is taking ABX at home. PT O2 sat was 100% upon arrival with baseline 3L o2. PT was given soul medrol  en route and a duoneb
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
## 2024-01-09 ENCOUNTER — Encounter: Payer: Self-pay | Admitting: Pulmonary Disease

## 2024-01-09 DIAGNOSIS — J441 Chronic obstructive pulmonary disease with (acute) exacerbation: Secondary | ICD-10-CM | POA: Diagnosis not present

## 2024-01-09 LAB — CBC
HCT: 35.9 % — ABNORMAL LOW (ref 36.0–46.0)
Hemoglobin: 11.3 g/dL — ABNORMAL LOW (ref 12.0–15.0)
MCH: 30.6 pg (ref 26.0–34.0)
MCHC: 31.5 g/dL (ref 30.0–36.0)
MCV: 97.3 fL (ref 80.0–100.0)
Platelets: 462 10*3/uL — ABNORMAL HIGH (ref 150–400)
RBC: 3.69 MIL/uL — ABNORMAL LOW (ref 3.87–5.11)
RDW: 13.9 % (ref 11.5–15.5)
WBC: 13.7 10*3/uL — ABNORMAL HIGH (ref 4.0–10.5)
nRBC: 0 % (ref 0.0–0.2)

## 2024-01-09 LAB — HIV ANTIBODY (ROUTINE TESTING W REFLEX): HIV Screen 4th Generation wRfx: NONREACTIVE

## 2024-01-09 LAB — CBG MONITORING, ED
Glucose-Capillary: 118 mg/dL — ABNORMAL HIGH (ref 70–99)
Glucose-Capillary: 134 mg/dL — ABNORMAL HIGH (ref 70–99)
Glucose-Capillary: 192 mg/dL — ABNORMAL HIGH (ref 70–99)
Glucose-Capillary: 390 mg/dL — ABNORMAL HIGH (ref 70–99)

## 2024-01-09 LAB — COMPREHENSIVE METABOLIC PANEL WITH GFR
ALT: 16 U/L (ref 0–44)
AST: 19 U/L (ref 15–41)
Albumin: 3.2 g/dL — ABNORMAL LOW (ref 3.5–5.0)
Alkaline Phosphatase: 67 U/L (ref 38–126)
Anion gap: 13 (ref 5–15)
BUN: 14 mg/dL (ref 8–23)
CO2: 30 mmol/L (ref 22–32)
Calcium: 9.5 mg/dL (ref 8.9–10.3)
Chloride: 96 mmol/L — ABNORMAL LOW (ref 98–111)
Creatinine, Ser: 0.87 mg/dL (ref 0.44–1.00)
GFR, Estimated: >60 mL/min (ref >60)
Glucose, Bld: 236 mg/dL — ABNORMAL HIGH (ref 70–99)
Potassium: 3.4 mmol/L — ABNORMAL LOW (ref 3.5–5.1)
Sodium: 139 mmol/L (ref 135–145)
Total Bilirubin: 0.2 mg/dL (ref 0.0–1.2)
Total Protein: 6.2 g/dL — ABNORMAL LOW (ref 6.5–8.1)

## 2024-01-09 LAB — GLUCOSE, CAPILLARY
Glucose-Capillary: 129 mg/dL — ABNORMAL HIGH (ref 70–99)
Glucose-Capillary: 157 mg/dL — ABNORMAL HIGH (ref 70–99)

## 2024-01-09 MED ORDER — METHYLPREDNISOLONE SODIUM SUCC 40 MG IJ SOLR: 40.0000 mg | Freq: Two times a day (BID) | INTRAMUSCULAR | Status: DC

## 2024-01-09 MED ORDER — ESCITALOPRAM OXALATE 20 MG PO TABS
20.0000 mg | ORAL_TABLET | Freq: Every day | ORAL | Status: DC
  Administered 2024-01-10 – 2024-01-11 (×2): 20 mg via ORAL
  Filled 2024-01-09 (×2): qty 1

## 2024-01-09 MED ORDER — UMECLIDINIUM-VILANTEROL 62.5-25 MCG/ACT IN AEPB: 1.0000 | INHALATION_SPRAY | Freq: Every day | RESPIRATORY_TRACT | Status: DC

## 2024-01-09 MED ORDER — GABAPENTIN 300 MG PO CAPS
600.0000 mg | ORAL_CAPSULE | Freq: Two times a day (BID) | ORAL | Status: DC
  Administered 2024-01-09 – 2024-01-11 (×6): 600 mg via ORAL
  Filled 2024-01-09 (×6): qty 2

## 2024-01-09 MED ORDER — BUDESON-GLYCOPYRROL-FORMOTEROL 160-9-4.8 MCG/ACT IN AERO
2.0000 | INHALATION_SPRAY | Freq: Two times a day (BID) | RESPIRATORY_TRACT | Status: DC
  Administered 2024-01-10 – 2024-01-11 (×3): 2 via RESPIRATORY_TRACT
  Filled 2024-01-09: qty 5.9

## 2024-01-09 MED ORDER — POTASSIUM CHLORIDE 20 MEQ PO PACK
40.0000 meq | PACK | Freq: Once | ORAL | Status: AC
  Administered 2024-01-09: 40 meq via ORAL
  Filled 2024-01-09: qty 2

## 2024-01-09 MED ORDER — TICAGRELOR 60 MG PO TABS
60.0000 mg | ORAL_TABLET | Freq: Two times a day (BID) | ORAL | Status: DC
  Administered 2024-01-09 – 2024-01-11 (×6): 60 mg via ORAL
  Filled 2024-01-09 (×8): qty 1

## 2024-01-09 MED ORDER — METFORMIN HCL 500 MG PO TABS
500.0000 mg | ORAL_TABLET | Freq: Two times a day (BID) | ORAL | Status: DC
  Administered 2024-01-09 – 2024-01-11 (×5): 500 mg via ORAL
  Filled 2024-01-09 (×5): qty 1

## 2024-01-09 MED ORDER — EZETIMIBE 10 MG PO TABS
10.0000 mg | ORAL_TABLET | Freq: Every day | ORAL | Status: DC
  Administered 2024-01-09 – 2024-01-10 (×3): 10 mg via ORAL
  Filled 2024-01-09 (×3): qty 1

## 2024-01-09 MED ORDER — INSULIN ASPART 100 UNIT/ML IJ SOLN
0.0000 [IU] | Freq: Every day | INTRAMUSCULAR | Status: DC
  Administered 2024-01-09: 5 [IU] via SUBCUTANEOUS

## 2024-01-09 MED ORDER — PREDNISONE 20 MG PO TABS
40.0000 mg | ORAL_TABLET | Freq: Every day | ORAL | Status: DC
  Administered 2024-01-10 – 2024-01-11 (×2): 40 mg via ORAL
  Filled 2024-01-09 (×3): qty 2

## 2024-01-09 MED ORDER — BUSPIRONE HCL 5 MG PO TABS
15.0000 mg | ORAL_TABLET | Freq: Every day | ORAL | Status: DC
  Administered 2024-01-09 – 2024-01-10 (×2): 15 mg via ORAL
  Filled 2024-01-09: qty 2
  Filled 2024-01-09: qty 3

## 2024-01-09 MED ORDER — IPRATROPIUM-ALBUTEROL 0.5-2.5 (3) MG/3ML IN SOLN
3.0000 mL | Freq: Three times a day (TID) | RESPIRATORY_TRACT | Status: DC
  Administered 2024-01-09 (×3): 3 mL via RESPIRATORY_TRACT
  Filled 2024-01-09 (×3): qty 3

## 2024-01-09 MED ORDER — CLONAZEPAM 0.5 MG PO TABS
0.5000 mg | ORAL_TABLET | Freq: Three times a day (TID) | ORAL | Status: DC | PRN
  Administered 2024-01-09 – 2024-01-10 (×2): 0.5 mg via ORAL
  Filled 2024-01-09 (×2): qty 1

## 2024-01-09 MED ORDER — FUROSEMIDE 40 MG PO TABS
40.0000 mg | ORAL_TABLET | Freq: Every day | ORAL | Status: DC
  Administered 2024-01-09 – 2024-01-11 (×3): 40 mg via ORAL
  Filled 2024-01-09 (×2): qty 1
  Filled 2024-01-09: qty 2

## 2024-01-09 MED ORDER — PANTOPRAZOLE SODIUM 40 MG PO TBEC
40.0000 mg | DELAYED_RELEASE_TABLET | Freq: Every day | ORAL | Status: DC
  Administered 2024-01-09 – 2024-01-11 (×3): 40 mg via ORAL
  Filled 2024-01-09 (×3): qty 1

## 2024-01-09 MED ORDER — SODIUM CHLORIDE 0.9 % IV SOLN
1.0000 g | INTRAVENOUS | Status: DC
  Administered 2024-01-09 – 2024-01-10 (×2): 1 g via INTRAVENOUS
  Filled 2024-01-09 (×2): qty 10

## 2024-01-09 MED ORDER — MONTELUKAST SODIUM 10 MG PO TABS
10.0000 mg | ORAL_TABLET | Freq: Every day | ORAL | Status: DC
  Administered 2024-01-09 – 2024-01-10 (×3): 10 mg via ORAL
  Filled 2024-01-09 (×3): qty 1

## 2024-01-09 MED ORDER — AZITHROMYCIN 250 MG PO TABS
250.0000 mg | ORAL_TABLET | Freq: Every day | ORAL | Status: DC
  Administered 2024-01-09 – 2024-01-11 (×3): 250 mg via ORAL
  Filled 2024-01-09 (×3): qty 1

## 2024-01-09 MED ORDER — ATORVASTATIN CALCIUM 80 MG PO TABS
80.0000 mg | ORAL_TABLET | Freq: Every morning | ORAL | Status: DC
  Administered 2024-01-09 – 2024-01-11 (×3): 80 mg via ORAL
  Filled 2024-01-09 (×3): qty 1

## 2024-01-09 MED ORDER — INSULIN ASPART 100 UNIT/ML IJ SOLN
0.0000 [IU] | Freq: Three times a day (TID) | INTRAMUSCULAR | Status: DC
  Administered 2024-01-09: 1 [IU] via SUBCUTANEOUS
  Administered 2024-01-09: 3 [IU] via SUBCUTANEOUS
  Administered 2024-01-11: 2 [IU] via SUBCUTANEOUS

## 2024-01-09 MED ORDER — DOXEPIN HCL 25 MG PO CAPS
75.0000 mg | ORAL_CAPSULE | Freq: Every day | ORAL | Status: DC
  Administered 2024-01-09 – 2024-01-10 (×3): 75 mg via ORAL
  Filled 2024-01-09: qty 3
  Filled 2024-01-09: qty 1
  Filled 2024-01-09 (×3): qty 3

## 2024-01-09 MED ORDER — ASPIRIN 81 MG PO TBEC
81.0000 mg | DELAYED_RELEASE_TABLET | Freq: Every morning | ORAL | Status: DC
  Administered 2024-01-09 – 2024-01-11 (×3): 81 mg via ORAL
  Filled 2024-01-09 (×3): qty 1

## 2024-01-09 MED ORDER — PREDNISONE 20 MG PO TABS: 40.0000 mg | ORAL_TABLET | Freq: Every day | ORAL | Status: DC

## 2024-01-09 MED ORDER — NITROGLYCERIN 0.4 MG SL SUBL: 0.4000 mg | SUBLINGUAL_TABLET | SUBLINGUAL | Status: DC | PRN

## 2024-01-09 MED ORDER — METHYLPREDNISOLONE SODIUM SUCC 125 MG IJ SOLR: 80.0000 mg | Freq: Every day | INTRAMUSCULAR | Status: AC

## 2024-01-09 MED ORDER — VARENICLINE TARTRATE 1 MG PO TABS
1.0000 mg | ORAL_TABLET | Freq: Two times a day (BID) | ORAL | Status: DC
  Administered 2024-01-09 – 2024-01-11 (×5): 1 mg via ORAL
  Filled 2024-01-09 (×6): qty 1

## 2024-01-09 MED ORDER — BUSPIRONE HCL 5 MG PO TABS
15.0000 mg | ORAL_TABLET | Freq: Every evening | ORAL | Status: DC
  Administered 2024-01-09 – 2024-01-10 (×2): 15 mg via ORAL
  Filled 2024-01-09 (×2): qty 3

## 2024-01-09 MED ORDER — ALBUTEROL SULFATE (2.5 MG/3ML) 0.083% IN NEBU: 2.5000 mg | INHALATION_SOLUTION | Freq: Four times a day (QID) | RESPIRATORY_TRACT | Status: DC | PRN

## 2024-01-09 MED ORDER — ENOXAPARIN SODIUM 40 MG/0.4ML IJ SOSY
40.0000 mg | PREFILLED_SYRINGE | Freq: Every day | INTRAMUSCULAR | Status: DC
  Administered 2024-01-09 – 2024-01-11 (×3): 40 mg via SUBCUTANEOUS
  Filled 2024-01-09 (×3): qty 0.4

## 2024-01-09 MED ORDER — BUSPIRONE HCL 5 MG PO TABS
30.0000 mg | ORAL_TABLET | Freq: Every morning | ORAL | Status: DC
  Administered 2024-01-10 – 2024-01-11 (×2): 30 mg via ORAL
  Filled 2024-01-09 (×2): qty 6
  Filled 2024-01-09: qty 3

## 2024-01-09 MED ORDER — NICOTINE 21 MG/24HR TD PT24
21.0000 mg | MEDICATED_PATCH | Freq: Every day | TRANSDERMAL | Status: DC
  Administered 2024-01-09 – 2024-01-11 (×3): 21 mg via TRANSDERMAL
  Filled 2024-01-09 (×3): qty 1

## 2024-01-09 MED ORDER — NEBIVOLOL HCL 10 MG PO TABS
10.0000 mg | ORAL_TABLET | Freq: Every day | ORAL | Status: DC
  Administered 2024-01-09 – 2024-01-10 (×3): 10 mg via ORAL
  Filled 2024-01-09 (×4): qty 1

## 2024-01-09 MED ORDER — ALBUTEROL SULFATE HFA 108 (90 BASE) MCG/ACT IN AERS: 2.0000 | INHALATION_SPRAY | Freq: Four times a day (QID) | RESPIRATORY_TRACT | Status: DC | PRN

## 2024-01-09 MED ORDER — LEVOTHYROXINE SODIUM 112 MCG PO TABS
112.0000 ug | ORAL_TABLET | Freq: Every day | ORAL | Status: DC
  Administered 2024-01-09 – 2024-01-11 (×3): 112 ug via ORAL
  Filled 2024-01-09 (×3): qty 1

## 2024-01-09 MED ORDER — MIRTAZAPINE 15 MG PO TABS
45.0000 mg | ORAL_TABLET | Freq: Every day | ORAL | Status: DC
  Administered 2024-01-09 – 2024-01-10 (×3): 45 mg via ORAL
  Filled 2024-01-09 (×3): qty 3
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
## 2024-01-09 NOTE — Care Management Obs Status (Signed)
 MEDICARE OBSERVATION STATUS NOTIFICATION   Patient Details  Name: [name] [name] MRN: [id number] Date of Birth: Dec 08, 1955   Medicare Observation Status Notification Given:  Yes    Corean JAYSON Canary, RN 01/09/2024, 1:11 PM

## 2024-01-09 NOTE — Telephone Encounter (Signed)
 Received fax from [employer] confirming receipt of [employer]  enrollment form  Patient ID: [id number]

## 2024-01-09 NOTE — Progress Notes (Signed)
 PROGRESS NOTE    [name] [name]  FMW:[id number] DOB: 02/16/1956 DOA: 01/08/2024 PCP: Vicci Barnie [name], MD   Brief Narrative:  This 68 y.o. female with PMH significant of COPD, coronary artery disease, chronic kidney disease stage II, non-insulin -dependent diabetes, hypothyroidism, essential hypertension, hyperlipidemia, history of lung nodules, GERD, and multiple other medical problems who is on home oxygen  at 2 to 3 L/min.  Patient is a lifelong tobacco smoker.  She is taking her inhalers and inhaled steroids at home but lost power.  Patient started having progressive shortness of breath yesterday with wheezing.  Unable to use oxygen  since she had no power.  She came to the ER where she was found to be hypoxic requiring more oxygen  than her usual 2 to 3 L.  Also noted to have diffuse expiratory wheezing.  Mild hypokalemia.  Patient being admitted with acute exacerbation of COPD.  Assessment & Plan:   Principal Problem:   COPD exacerbation (HCC) Active Problems:   Hypothyroidism   TOBACCO ABUSE   Essential hypertension   GERD (gastroesophageal reflux disease)   Anxiety and depression   Major depressive disorder, recurrent, in full remission with anxious distress (HCC)   History of CAD (coronary artery disease)   CKD (chronic kidney disease) stage 2, GFR 60-89 ml/min   Hypokalemia  Acute on chronic hypoxic respiratory failure: COPD with acute exacerbation: Patient usually remains on 2 to 3 L of supplemental oxygen  now requiring more due to COPD flare.   Probably exacerbated by patient's inability use oxygen  at home.   Continue IV steroids, antibiotics, breathing treatments.   Patient back on oxygen  at 3 to 4 L.  Titrate her down to 2 L.   Tobacco abuse:  Nicotine  patch will be added.   Non-insulin -dependent diabetes:  Initiate sliding scale insulin .   Hypothyroidism:  Continue levothyroxine    Hyperlipidemia:  Continue with statin   Essential hypertension:  Continue home  blood pressure medications   Anxiety with depression:  Continue Klonopin  and other home regime.   History of coronary artery disease:  Stable > no decompensation.    DVT prophylaxis: Lovenox  Code Status: Full code Family Communication: No family at bedside Disposition Plan:    Status is: Observation The patient remains OBS appropriate and will d/c before 2 midnights.   Patient admitted for acute on chronic hypoxic respiratory failure secondary to acute exacerbation of COPD.  Consultants:  None  Procedures: None  Antimicrobials: Anti-infectives (From admission, onward)    Start     Dose/Rate Route Frequency Ordered Stop   01/09/24 1000  azithromycin  (ZITHROMAX ) tablet 250 mg        250 mg Oral Daily 01/09/24 0000     01/09/24 0000  cefTRIAXone  (ROCEPHIN ) 1 g in sodium chloride  0.9 % 100 mL IVPB        1 g 200 mL/hr over 30 Minutes Intravenous Every 24 hours 01/09/24 0000 01/14/24 2159   01/08/24 2015  cefTRIAXone  (ROCEPHIN ) 1 g in sodium chloride  0.9 % 100 mL IVPB        1 g 200 mL/hr over 30 Minutes Intravenous  Once 01/08/24 2014 01/08/24 2111      Subjective: Patient was seen and examined at bedside.  Overnight events noted. Patient was sitting on the edge of the bed,  states she is still having difficulty breathing.   She remains on 3 L of oxygen ,  appears comfortable.  Objective: Vitals:   01/09/24 0700 01/09/24 0800 01/09/24 0801 01/09/24 0830  BP: (!) 107/50  126/63    Pulse: 86 88  88  Resp: (!) 22 (!) 23  14  Temp:   97.9 F (36.6 C)   TempSrc:      SpO2: 92% (!) 89%  99%  Weight:      Height:        Intake/Output Summary (Last 24 hours) at 01/09/2024 1102 Last data filed at 01/09/2024 0801 Gross per 24 hour  Intake 800 ml  Output --  Net 800 ml   Filed Weights   01/08/24 1955  Weight: 70.3 kg    Examination:  General exam: Appears calm and comfortable, not in any acute distress. Respiratory system: Wheezing bilaterally. Respiratory effort  normal. RR 16 Cardiovascular system: S1 & S2 heard, RRR. No JVD, murmurs, rubs, gallops or clicks.  Gastrointestinal system: Abdomen is non distended, soft and non tender.  Normal bowel sounds heard. Central nervous system: Alert and oriented x 3. No focal neurological deficits. Extremities: No edema, no cyanosis, no clubbing Skin: No rashes, lesions or ulcers Psychiatry: Judgement and insight appear normal. Mood & affect appropriate.     Data Reviewed: I have personally reviewed following labs and imaging studies  CBC: Recent Labs  Lab 01/08/24 2027 01/09/24 0532  WBC 13.3* 13.7*  NEUTROABS 9.8*  --   HGB 12.0 11.3*  HCT 38.0 35.9*  MCV 96.7 97.3  PLT 459* 462*   Basic Metabolic Panel: Recent Labs  Lab 01/08/24 2027 01/09/24 0532  NA 136 139  K 3.4* 3.4*  CL 93* 96*  CO2 30 30  GLUCOSE 153* 236*  BUN 14 14  CREATININE 0.86 0.87  CALCIUM  9.2 9.5  MG 1.7  --    GFR: Estimated Creatinine Clearance: 56.3 mL/min (by C-G formula based on SCr of 0.87 mg/dL). Liver Function Tests: Recent Labs  Lab 01/08/24 2027 01/09/24 0532  AST 18 19  ALT 17 16  ALKPHOS 72 67  BILITOT 0.4 0.2  PROT 6.6 6.2*  ALBUMIN 3.4* 3.2*   No results for input(s): LIPASE, AMYLASE in the last 168 hours. No results for input(s): AMMONIA in the last 168 hours. Coagulation Profile: No results for input(s): INR, PROTIME in the last 168 hours. Cardiac Enzymes: No results for input(s): CKTOTAL, CKMB, CKMBINDEX, TROPONINI in the last 168 hours. BNP (last 3 results) No results for input(s): PROBNP in the last 8760 hours. HbA1C: No results for input(s): HGBA1C in the last 72 hours. CBG: Recent Labs  Lab 01/09/24 0020 01/09/24 0759  GLUCAP 390* 192*   Lipid Profile: No results for input(s): CHOL, HDL, LDLCALC, TRIG, CHOLHDL, LDLDIRECT in the last 72 hours. Thyroid  Function Tests: No results for input(s): TSH, T4TOTAL, FREET4, T3FREE, THYROIDAB  in the last 72 hours. Anemia Panel: No results for input(s): VITAMINB12, FOLATE, FERRITIN, TIBC, IRON , RETICCTPCT in the last 72 hours. Sepsis Labs: No results for input(s): PROCALCITON, LATICACIDVEN in the last 168 hours.  Recent Results (from the past 240 hours)  Resp panel by RT-PCR (RSV, Flu A&B, Covid) Anterior Nasal Swab     Status: None   Collection Time: 01/08/24  8:27 PM   Specimen: Anterior Nasal Swab  Result Value Ref Range Status   SARS Coronavirus 2 by RT PCR NEGATIVE NEGATIVE Final   Influenza A by PCR NEGATIVE NEGATIVE Final   Influenza B by PCR NEGATIVE NEGATIVE Final    Comment: (NOTE) The Xpert Xpress SARS-CoV-2/FLU/RSV plus assay is intended as an aid in the diagnosis of influenza from Nasopharyngeal swab specimens and should not be  used as a sole basis for treatment. Nasal washings and aspirates are unacceptable for Xpert Xpress SARS-CoV-2/FLU/RSV testing.  Fact Sheet for Patients: BloggerCourse.com  Fact Sheet for Healthcare Providers: SeriousBroker.it  This test is not yet approved or cleared by the [place]  FDA and has been authorized for detection and/or diagnosis of SARS-CoV-2 by FDA under an Emergency Use Authorization (EUA). This EUA will remain in effect (meaning this test can be used) for the duration of the COVID-19 declaration under Section 564(b)(1) of the Act, 21 U.S.C. section 360bbb-3(b)(1), unless the authorization is terminated or revoked.     Resp Syncytial Virus by PCR NEGATIVE NEGATIVE Final    Comment: (NOTE) Fact Sheet for Patients: BloggerCourse.com  Fact Sheet for Healthcare Providers: SeriousBroker.it  This test is not yet approved or cleared by the [place]  FDA and has been authorized for detection and/or diagnosis of SARS-CoV-2 by FDA under an Emergency Use Authorization (EUA). This EUA will remain in  effect (meaning this test can be used) for the duration of the COVID-19 declaration under Section 564(b)(1) of the Act, 21 U.S.C. section 360bbb-3(b)(1), unless the authorization is terminated or revoked.  Performed at [facility], [address], [place], [place] [address]     Radiology Studies: DG Chest Port 1 View Result Date: 01/08/2024 CLINICAL DATA:  Dyspnea EXAM: PORTABLE CHEST 1 VIEW COMPARISON:  12/04/2023 multiple remote right rib fractures noted. Healed left FINDINGS: Mild bibasilar atelectasis. Lungs are otherwise clear. No pneumothorax or pleural effusion. Cardiac size within normal limits. Pulmonary vascularity is normal. Osseous structures are age-appropriate. Clavicular mid-diaphyseal fracture. No acute bone abnormality. IMPRESSION: No active disease. Electronically Signed   By: Dorethia Molt M.D.   On: 01/08/2024 21:08   Scheduled Meds:  aspirin  EC  81 mg Oral q AM   atorvastatin   80 mg Oral q AM   azithromycin   250 mg Oral Daily   budesonide -glycopyrrolate -formoterol   2 puff Inhalation BID   busPIRone   30 mg Oral q AM   And   busPIRone   15 mg Oral Q1200   And   busPIRone   15 mg Oral QPM   doxepin   75 mg Oral QHS   enoxaparin  (LOVENOX ) injection  40 mg Subcutaneous Daily   escitalopram   20 mg Oral Daily   ezetimibe   10 mg Oral QHS   furosemide   40 mg Oral Daily   gabapentin   600 mg Oral BID   insulin  aspart  0-15 Units Subcutaneous TID WC   insulin  aspart  0-5 Units Subcutaneous QHS   ipratropium-albuterol   3 mL Nebulization Q8H   levothyroxine   112 mcg Oral QAC breakfast   metFORMIN   500 mg Oral BID WC   methylPREDNISolone  (SOLU-MEDROL ) injection  80 mg Intravenous Daily   Followed by   NOREEN ON 01/10/2024] predniSONE   40 mg Oral Q breakfast   mirtazapine   45 mg Oral QHS   montelukast   10 mg Oral QHS   nebivolol   10 mg Oral QHS   nicotine   21 mg Transdermal Daily   pantoprazole   40 mg Oral Daily   ticagrelor   60 mg Oral BID   varenicline   1 mg Oral  BID WC   Continuous Infusions:  cefTRIAXone  (ROCEPHIN )  IV       LOS: 0 days    Time spent: 50 mins    Darcel Dawley, MD [facility]   If 7PM-7AM, please contact night-coverage

## 2024-01-09 NOTE — Evaluation (Signed)
 Occupational Therapy Evaluation Patient Details Name: [name] [name] MRN: [id number] DOB: 1955-11-26 Today's Date: 01/09/2024   History of Present Illness   Pt is a 68 y.o. F presenting to [facility] on 01/08/24 w/ SOB and wheezing; pt admitted w/ acute exacerbation of COPD.  PMH: HTN, CAD with multiple DES, DM2, COPD, anxiety, hypothyroidism, RTC repair 12/24/2021, ongoing tobacco abuse.     Clinical Impressions [name] [name] was evaluated s/p the above admission list. She lives alone and is mod I at baseline. Upon evaluation the pt was limited by limited activity tolerance and decreased insight into safety. Overall she required CGA fro transfers and mobility without AD. Due to the deficits listed below the pt also needs up to CGA for ADLs with increased time due to SOB. Education provided throughout on supplemental O2 use at home, pt reports she often takes her O2 off during self care tasks - educated otherwise and she verbalized understanding. Pt will benefit from continued acute OT services without need for follow up OT at discharge.  3L maintained throughout with SpO2 >92%      If plan is discharge home, recommend the following:   A little help with bathing/dressing/bathroom;Assist for transportation     Functional Status Assessment   Patient has had a recent decline in their functional status and demonstrates the ability to make significant improvements in function in a reasonable and predictable amount of time.     Equipment Recommendations   None recommended by OT      Precautions/Restrictions   Precautions Precautions: Fall Recall of Precautions/Restrictions: Intact Precaution/Restrictions Comments: chronic 3L O2 Restrictions Weight Bearing Restrictions Per Provider Order: No     Mobility Bed Mobility Overal bed mobility: Needs Assistance Bed Mobility: Supine to Sit     Supine to sit: Supervision          Transfers Overall transfer level: Needs  assistance Equipment used: None Transfers: Sit to/from Stand Sit to Stand: Contact guard assist          Balance Overall balance assessment: Needs assistance Sitting-balance support: Feet supported Sitting balance-Leahy Scale: Good     Standing balance support: No upper extremity supported, During functional activity Standing balance-Leahy Scale: Fair             ADL either performed or assessed with clinical judgement   ADL Overall ADL's : Needs assistance/impaired Eating/Feeding: Independent   Grooming: Supervision/safety;Standing   Upper Body Bathing: Supervision/ safety;Sitting   Lower Body Bathing: Contact guard assist;Sit to/from stand   Upper Body Dressing : Set up;Sitting   Lower Body Dressing: Contact guard assist;Sit to/from stand   Toilet Transfer: Contact guard assist;Ambulation   Toileting- Clothing Manipulation and Hygiene: Supervision/safety;Sitting/lateral lean       Functional mobility during ADLs: Contact guard assist General ADL Comments: no AD, CGA for safety. Pt reports she feels as if she is moving fairly close to her functional baseline     Vision Baseline Vision/History: 0 No visual deficits Vision Assessment?: No apparent visual deficits     Perception Perception: Within Functional Limits       Praxis Praxis: [facility]          Extremity/Trunk Assessment Upper Extremity Assessment Upper Extremity Assessment: Generalized weakness   Lower Extremity Assessment Lower Extremity Assessment: Defer to PT evaluation   Cervical / Trunk Assessment Cervical / Trunk Assessment: Normal   Communication Communication Communication: No apparent difficulties   Cognition Arousal: Alert Behavior During Therapy: WFL for tasks assessed/performed Cognition: No apparent impairments  OT - Cognition Comments: overall WFL for orientation and tasks assessed. Pt with limited insight, education provided on importance to wear home O2  all of the time including showers                 Following commands: Intact       Cueing  General Comments   Cueing Techniques: Verbal cues  Pt's [place] off upon arrival SpO2 was 88%, 3L donned with quick recovery to 94%           Home Living Family/patient expects to be discharged to:: Private residence Living Arrangements: Alone Available Help at Discharge: Family;Available PRN/intermittently Type of Home: Apartment Home Access: Stairs to enter Entrance Stairs-Number of Steps: 2 Entrance Stairs-Rails: Left Home Layout: One level     Bathroom Shower/Tub: Producer, television/film/video: Standard Bathroom Accessibility: Yes   Home Equipment: Agricultural consultant (2 wheels);Shower seat;Hand held shower head;Grab bars - tub/shower;Rollator (4 wheels)          Prior Functioning/Environment Prior Level of Function : Independent/Modified Independent             Mobility Comments: no AD, denies falls ADLs Comments: mod I ADLs, does not drive, dtr drives or pt takes the bus    OT Problem List: Decreased activity tolerance;Impaired balance (sitting and/or standing);Decreased cognition;Decreased safety awareness   OT Treatment/Interventions: Self-care/ADL training;Therapeutic exercise;Energy conservation;DME and/or AE instruction      OT Goals(Current goals can be found in the care plan section)   Acute Rehab OT Goals Patient Stated Goal: home OT Goal Formulation: With patient Time For Goal Achievement: 01/23/24 Potential to Achieve Goals: Good ADL Goals Additional ADL Goal #1: Pt will complete all ADLs with mod I Additional ADL Goal #2: Pt will indep recall at least 3 energy conservation strategies to apply at discharge   OT Frequency:  Min 1X/week       AM-PAC OT 6 Clicks Daily Activity     Outcome Measure Help from another person eating meals?: None Help from another person taking care of personal grooming?: A Little Help from another person  toileting, which includes using toliet, bedpan, or urinal?: A Little Help from another person bathing (including washing, rinsing, drying)?: A Little Help from another person to put on and taking off regular upper body clothing?: A Little Help from another person to put on and taking off regular lower body clothing?: A Little 6 Click Score: 19   End of Session Equipment Utilized During Treatment: Gait belt;Oxygen  Nurse Communication: Mobility status  Activity Tolerance: Patient tolerated treatment well Patient left: in bed  OT Visit Diagnosis: Unsteadiness on feet (R26.81);Other abnormalities of gait and mobility (R26.89);Muscle weakness (generalized) (M62.81)                Time: 9241-9187 OT Time Calculation (min): 14 min Charges:  OT General Charges $OT Visit: 1 Visit OT Evaluation $OT Eval Moderate Complexity: 1 Mod  Lucie Kendall, OTR/L Acute Rehabilitation Services Office [phone] Secure Chat Communication Preferred   Lucie JONETTA Kendall 01/09/2024, 10:36 AM

## 2024-01-10 DIAGNOSIS — E039 Hypothyroidism, unspecified: Secondary | ICD-10-CM | POA: Diagnosis present

## 2024-01-10 DIAGNOSIS — J441 Chronic obstructive pulmonary disease with (acute) exacerbation: Secondary | ICD-10-CM | POA: Diagnosis present

## 2024-01-10 DIAGNOSIS — F17210 Nicotine dependence, cigarettes, uncomplicated: Secondary | ICD-10-CM | POA: Diagnosis present

## 2024-01-10 DIAGNOSIS — Z7984 Long term (current) use of oral hypoglycemic drugs: Secondary | ICD-10-CM | POA: Diagnosis not present

## 2024-01-10 DIAGNOSIS — Z7951 Long term (current) use of inhaled steroids: Secondary | ICD-10-CM | POA: Diagnosis not present

## 2024-01-10 DIAGNOSIS — Z9981 Dependence on supplemental oxygen: Secondary | ICD-10-CM | POA: Diagnosis not present

## 2024-01-10 DIAGNOSIS — N182 Chronic kidney disease, stage 2 (mild): Secondary | ICD-10-CM | POA: Diagnosis present

## 2024-01-10 DIAGNOSIS — I129 Hypertensive chronic kidney disease with stage 1 through stage 4 chronic kidney disease, or unspecified chronic kidney disease: Secondary | ICD-10-CM | POA: Diagnosis present

## 2024-01-10 DIAGNOSIS — Z7983 Long term (current) use of bisphosphonates: Secondary | ICD-10-CM | POA: Diagnosis not present

## 2024-01-10 DIAGNOSIS — E785 Hyperlipidemia, unspecified: Secondary | ICD-10-CM | POA: Diagnosis present

## 2024-01-10 DIAGNOSIS — F3342 Major depressive disorder, recurrent, in full remission: Secondary | ICD-10-CM | POA: Diagnosis present

## 2024-01-10 DIAGNOSIS — Z1152 Encounter for screening for COVID-19: Secondary | ICD-10-CM | POA: Diagnosis not present

## 2024-01-10 DIAGNOSIS — Z833 Family history of diabetes mellitus: Secondary | ICD-10-CM | POA: Diagnosis not present

## 2024-01-10 DIAGNOSIS — E876 Hypokalemia: Secondary | ICD-10-CM | POA: Diagnosis present

## 2024-01-10 DIAGNOSIS — R0602 Shortness of breath: Secondary | ICD-10-CM | POA: Diagnosis present

## 2024-01-10 DIAGNOSIS — Z79899 Other long term (current) drug therapy: Secondary | ICD-10-CM | POA: Diagnosis not present

## 2024-01-10 DIAGNOSIS — Z8249 Family history of ischemic heart disease and other diseases of the circulatory system: Secondary | ICD-10-CM | POA: Diagnosis not present

## 2024-01-10 DIAGNOSIS — D72829 Elevated white blood cell count, unspecified: Secondary | ICD-10-CM | POA: Diagnosis present

## 2024-01-10 DIAGNOSIS — I2510 Atherosclerotic heart disease of native coronary artery without angina pectoris: Secondary | ICD-10-CM | POA: Diagnosis present

## 2024-01-10 DIAGNOSIS — Z955 Presence of coronary angioplasty implant and graft: Secondary | ICD-10-CM | POA: Diagnosis not present

## 2024-01-10 DIAGNOSIS — E1122 Type 2 diabetes mellitus with diabetic chronic kidney disease: Secondary | ICD-10-CM | POA: Diagnosis present

## 2024-01-10 DIAGNOSIS — Z79890 Hormone replacement therapy (postmenopausal): Secondary | ICD-10-CM | POA: Diagnosis not present

## 2024-01-10 DIAGNOSIS — J9621 Acute and chronic respiratory failure with hypoxia: Secondary | ICD-10-CM | POA: Diagnosis present

## 2024-01-10 DIAGNOSIS — K219 Gastro-esophageal reflux disease without esophagitis: Secondary | ICD-10-CM | POA: Diagnosis present

## 2024-01-10 DIAGNOSIS — E1165 Type 2 diabetes mellitus with hyperglycemia: Secondary | ICD-10-CM | POA: Diagnosis present

## 2024-01-10 LAB — GLUCOSE, CAPILLARY
Glucose-Capillary: 118 mg/dL — ABNORMAL HIGH (ref 70–99)
Glucose-Capillary: 101 mg/dL — ABNORMAL HIGH (ref 70–99)
Glucose-Capillary: 120 mg/dL — ABNORMAL HIGH (ref 70–99)
Glucose-Capillary: 199 mg/dL — ABNORMAL HIGH (ref 70–99)

## 2024-01-10 MED ORDER — ENSURE PLUS HIGH PROTEIN PO LIQD
237.0000 mL | Freq: Two times a day (BID) | ORAL | Status: DC
  Administered 2024-01-10 – 2024-01-11 (×2): 237 mL via ORAL

## 2024-01-10 MED ORDER — POTASSIUM CHLORIDE 20 MEQ PO PACK
40.0000 meq | PACK | Freq: Once | ORAL | Status: AC
  Administered 2024-01-10: 40 meq via ORAL
  Filled 2024-01-10: qty 2

## 2024-01-10 MED ORDER — IPRATROPIUM-ALBUTEROL 0.5-2.5 (3) MG/3ML IN SOLN
3.0000 mL | Freq: Three times a day (TID) | RESPIRATORY_TRACT | Status: DC
  Administered 2024-01-10 – 2024-01-11 (×2): 3 mL via RESPIRATORY_TRACT
  Filled 2024-01-10 (×2): qty 3

## 2024-01-10 MED ORDER — ADULT MULTIVITAMIN W/MINERALS CH
1.0000 | ORAL_TABLET | Freq: Every day | ORAL | Status: DC
  Administered 2024-01-10 – 2024-01-11 (×2): 1 via ORAL
  Filled 2024-01-10 (×2): qty 1

## 2024-01-10 NOTE — Progress Notes (Addendum)
 Initial Nutrition Assessment  DOCUMENTATION CODES:   Not applicable  INTERVENTION:   Encourage PO intake- Currently Heart healthy diet  Room service with assist  Ensure Plus High Protein po BID, each supplement provides 350 kcal and 20 grams of protein MVI with minerals daily  Recommend updated weight   NUTRITION DIAGNOSIS:   Increased nutrient needs related to chronic illness as evidenced by estimated needs.   GOAL:   Patient will meet greater than or equal to 90% of their needs   MONITOR:   PO intake, Supplement acceptance  REASON FOR ASSESSMENT:   Consult Assessment of nutrition requirement/status  ASSESSMENT:  68 y.o. female with PMH of COPD on home oxygen  at 2 to 3 L/min, CAD, CKD II, non-insulin -dependent diabetes, hypothyroidism, HTN, HLD,  GERD, tobacco smoker. Presented to ED with SOB and unable to use oxygen  after losing power. Admitted with acute exacerbation of COPD.  RD working remotely, attempted to call pt with no response.   Pt with multiple admissions since the beginning of the year for COPD exacerbation. Weight appears stable based on weight history. Pt able to ambulate with Rolator and without. Lives alone. No wounds noted. RD will supplement pt's intake with ONS.   Admit weight: 70.3 kg Current weight: 70.3 kg   Average Meal Intake: No meals recorded   Nutritionally Relevant Medications: Scheduled Meds:  furosemide   40 mg Oral Daily   gabapentin   600 mg Oral BID   insulin  aspart  0-15 Units Subcutaneous TID WC   insulin  aspart  0-5 Units Subcutaneous QHS   ipratropium-albuterol   3 mL Nebulization TID   levothyroxine   112 mcg Oral QAC breakfast   metFORMIN   500 mg Oral BID WC   methylPREDNISolone  (SOLU-MEDROL ) injection  80 mg Intravenous Daily   Followed by   predniSONE   40 mg Oral Q breakfast   mirtazapine   45 mg Oral QHS   montelukast   10 mg Oral QHS   nebivolol   10 mg Oral QHS   nicotine   21 mg Transdermal Daily   pantoprazole   40  mg Oral Daily   potassium chloride   40 mEq Oral Once   Continuous Infusions:  cefTRIAXone  (ROCEPHIN )  IV Stopped (01/09/24 2303)    Labs Reviewed: Potassium 3.4 Total protein 6.2  CBG ranges from 118-390 mg/dL over the last 24 hours HgbA1c 6.4   NUTRITION - FOCUSED PHYSICAL EXAM:  - Deferred to follow up   Diet Order:   Diet Order             Diet Heart Room service appropriate? Yes; Fluid consistency: Thin  Diet effective now                   EDUCATION NEEDS:   No education needs have been identified at this time  Skin:  Skin Assessment: Reviewed RN Assessment  Last BM:  PTA  Height:   Ht Readings from Last 1 Encounters:  01/08/24 5' 1 (1.549 m)    Weight:   Wt Readings from Last 1 Encounters:  01/08/24 70.3 kg    BMI:  Body mass index is 29.29 kg/m.  Estimated Nutritional Needs:   Kcal:  1600-1800  Protein:  80-100 gm  Fluid:  >1.6L per day   Olivia Kenning, RD Registered Dietitian  See Amion for more information

## 2024-01-10 NOTE — Progress Notes (Signed)
 PROGRESS NOTE    [name] [name]  FMW:[id number] DOB: 1956-06-28 DOA: 01/08/2024 PCP: Vicci Barnie [name], MD   Brief Narrative:  This 68 y.o. female with PMH significant of COPD, coronary artery disease, chronic kidney disease stage II, non-insulin -dependent diabetes, hypothyroidism, essential hypertension, hyperlipidemia, history of lung nodules, GERD, and multiple other medical problems who is on home oxygen  at 2 to 3 L/min.  Patient is a lifelong tobacco smoker.  She is taking her inhalers and inhaled steroids at home but lost power.  Patient started having progressive shortness of breath yesterday with wheezing.  Unable to use oxygen  since she had no power.  She came to the ER where she was found to be hypoxic requiring more oxygen  than her usual 2 to 3 L.  Also noted to have diffuse expiratory wheezing.  Mild hypokalemia.  Patient being admitted with acute exacerbation of COPD.  Assessment & Plan:   Principal Problem:   COPD exacerbation (HCC) Active Problems:   Hypothyroidism   TOBACCO ABUSE   Essential hypertension   GERD (gastroesophageal reflux disease)   Anxiety and depression   Major depressive disorder, recurrent, in full remission with anxious distress (HCC)   History of CAD (coronary artery disease)   CKD (chronic kidney disease) stage 2, GFR 60-89 ml/min   Hypokalemia  Acute on chronic hypoxic respiratory failure: COPD with acute exacerbation: Patient usually remains on 2 to 3 L of supplemental oxygen  now requiring more due to COPD flare.   Probably exacerbated by patient's inability use oxygen  at home.   Continue IV steroids, antibiotics, breathing treatments.   Patient back on oxygen  at 3 to 4 L.  Titrate her down to 2 L. Patient still has significant amount of wheezing noted on exam.   Tobacco abuse:  Continue Nicotine  patch.   Non-insulin -dependent diabetes:  Continue sliding scale insulin .   Hypothyroidism:  Continue levothyroxine .   Hyperlipidemia:   Continue with statin.   Essential hypertension:  Continue home blood pressure medications   Anxiety with depression:  Continue Klonopin  and other home regime.   History of coronary artery disease:  Stable > no decompensation.    DVT prophylaxis: Lovenox  Code Status: Full code Family Communication: No family at bedside Disposition Plan:    Status is: Observation The patient remains OBS appropriate and will d/c before 2 midnights.   Patient admitted for acute on chronic hypoxic respiratory failure secondary to acute exacerbation of COPD.  Consultants:  None  Procedures: None  Antimicrobials: Anti-infectives (From admission, onward)    Start     Dose/Rate Route Frequency Ordered Stop   01/09/24 1000  azithromycin  (ZITHROMAX ) tablet 250 mg        250 mg Oral Daily 01/09/24 0000     01/09/24 0000  cefTRIAXone  (ROCEPHIN ) 1 g in sodium chloride  0.9 % 100 mL IVPB        1 g 200 mL/hr over 30 Minutes Intravenous Every 24 hours 01/09/24 0000 01/14/24 2159   01/08/24 2015  cefTRIAXone  (ROCEPHIN ) 1 g in sodium chloride  0.9 % 100 mL IVPB        1 g 200 mL/hr over 30 Minutes Intravenous  Once 01/08/24 2014 01/08/24 2111      Subjective: Patient was seen and examined at bedside.  Overnight events noted. She still has significant amount of wheezing noted on exam. She remains on 4 L of oxygen ,  appears comfortable.  Objective: Vitals:   01/09/24 2009 01/10/24 0033 01/10/24 0516 01/10/24 0802  BP: (!) 119/57 ROLLEN)  103/50 (!) 110/53 (!) 118/57  Pulse: 75 76 67 65  Resp:  17 17 18   Temp: 98.2 F (36.8 C) 98.8 F (37.1 C) 98.2 F (36.8 C) 98.5 F (36.9 C)  TempSrc: Oral Oral Oral   SpO2: 100% 100% 100% 99%  Weight:      Height:        Intake/Output Summary (Last 24 hours) at 01/10/2024 1047 Last data filed at 01/10/2024 0900 Gross per 24 hour  Intake 580 ml  Output --  Net 580 ml   Filed Weights   01/08/24 1955  Weight: 70.3 kg    Examination:  General exam:  Appears calm and comfortable, not in any acute distress. Respiratory system: Wheezing bilaterally. Respiratory effort normal. RR 14 Cardiovascular system: S1 & S2 heard, RRR. No JVD, murmurs, rubs, gallops or clicks.  Gastrointestinal system: Abdomen is non distended, soft and non tender.  Normal bowel sounds heard. Central nervous system: Alert and oriented x 3. No focal neurological deficits. Extremities: No edema, no cyanosis, no clubbing Skin: No rashes, lesions or ulcers Psychiatry: Judgement and insight appear normal. Mood & affect appropriate.     Data Reviewed: I have personally reviewed following labs and imaging studies  CBC: Recent Labs  Lab 01/08/24 2027 01/09/24 0532  WBC 13.3* 13.7*  NEUTROABS 9.8*  --   HGB 12.0 11.3*  HCT 38.0 35.9*  MCV 96.7 97.3  PLT 459* 462*   Basic Metabolic Panel: Recent Labs  Lab 01/08/24 2027 01/09/24 0532  NA 136 139  K 3.4* 3.4*  CL 93* 96*  CO2 30 30  GLUCOSE 153* 236*  BUN 14 14  CREATININE 0.86 0.87  CALCIUM  9.2 9.5  MG 1.7  --    GFR: Estimated Creatinine Clearance: 56.3 mL/min (by C-G formula based on SCr of 0.87 mg/dL). Liver Function Tests: Recent Labs  Lab 01/08/24 2027 01/09/24 0532  AST 18 19  ALT 17 16  ALKPHOS 72 67  BILITOT 0.4 0.2  PROT 6.6 6.2*  ALBUMIN 3.4* 3.2*   No results for input(s): LIPASE, AMYLASE in the last 168 hours. No results for input(s): AMMONIA in the last 168 hours. Coagulation Profile: No results for input(s): INR, PROTIME in the last 168 hours. Cardiac Enzymes: No results for input(s): CKTOTAL, CKMB, CKMBINDEX, TROPONINI in the last 168 hours. BNP (last 3 results) No results for input(s): PROBNP in the last 8760 hours. HbA1C: No results for input(s): HGBA1C in the last 72 hours. CBG: Recent Labs  Lab 01/09/24 1225 01/09/24 1720 01/09/24 1835 01/09/24 2007 01/10/24 0800  GLUCAP 134* 118* 129* 157* 118*   Lipid Profile: No results for input(s):  CHOL, HDL, LDLCALC, TRIG, CHOLHDL, LDLDIRECT in the last 72 hours. Thyroid  Function Tests: No results for input(s): TSH, T4TOTAL, FREET4, T3FREE, THYROIDAB in the last 72 hours. Anemia Panel: No results for input(s): VITAMINB12, FOLATE, FERRITIN, TIBC, IRON , RETICCTPCT in the last 72 hours. Sepsis Labs: No results for input(s): PROCALCITON, LATICACIDVEN in the last 168 hours.  Recent Results (from the past 240 hours)  Resp panel by RT-PCR (RSV, Flu A&B, Covid) Anterior Nasal Swab     Status: None   Collection Time: 01/08/24  8:27 PM   Specimen: Anterior Nasal Swab  Result Value Ref Range Status   SARS Coronavirus 2 by RT PCR NEGATIVE NEGATIVE Final   Influenza A by PCR NEGATIVE NEGATIVE Final   Influenza B by PCR NEGATIVE NEGATIVE Final    Comment: (NOTE) The Xpert Xpress SARS-CoV-2/FLU/RSV plus assay is  intended as an aid in the diagnosis of influenza from Nasopharyngeal swab specimens and should not be used as a sole basis for treatment. Nasal washings and aspirates are unacceptable for Xpert Xpress SARS-CoV-2/FLU/RSV testing.  Fact Sheet for Patients: BloggerCourse.com  Fact Sheet for Healthcare Providers: SeriousBroker.it  This test is not yet approved or cleared by the [place]  FDA and has been authorized for detection and/or diagnosis of SARS-CoV-2 by FDA under an Emergency Use Authorization (EUA). This EUA will remain in effect (meaning this test can be used) for the duration of the COVID-19 declaration under Section 564(b)(1) of the Act, 21 U.S.C. section 360bbb-3(b)(1), unless the authorization is terminated or revoked.     Resp Syncytial Virus by PCR NEGATIVE NEGATIVE Final    Comment: (NOTE) Fact Sheet for Patients: BloggerCourse.com  Fact Sheet for Healthcare Providers: SeriousBroker.it  This test is not yet approved or  cleared by the [place]  FDA and has been authorized for detection and/or diagnosis of SARS-CoV-2 by FDA under an Emergency Use Authorization (EUA). This EUA will remain in effect (meaning this test can be used) for the duration of the COVID-19 declaration under Section 564(b)(1) of the Act, 21 U.S.C. section 360bbb-3(b)(1), unless the authorization is terminated or revoked.  Performed at [facility], [address], [place], [place] [address]     Radiology Studies: DG Chest Port 1 View Result Date: 01/08/2024 CLINICAL DATA:  Dyspnea EXAM: PORTABLE CHEST 1 VIEW COMPARISON:  12/04/2023 multiple remote right rib fractures noted. Healed left FINDINGS: Mild bibasilar atelectasis. Lungs are otherwise clear. No pneumothorax or pleural effusion. Cardiac size within normal limits. Pulmonary vascularity is normal. Osseous structures are age-appropriate. Clavicular mid-diaphyseal fracture. No acute bone abnormality. IMPRESSION: No active disease. Electronically Signed   By: Dorethia Molt M.D.   On: 01/08/2024 21:08   Scheduled Meds:  aspirin  EC  81 mg Oral q AM   atorvastatin   80 mg Oral q AM   azithromycin   250 mg Oral Daily   budesonide -glycopyrrolate -formoterol   2 puff Inhalation BID   busPIRone   30 mg Oral q AM   And   busPIRone   15 mg Oral Q1200   And   busPIRone   15 mg Oral QPM   doxepin   75 mg Oral QHS   enoxaparin  (LOVENOX ) injection  40 mg Subcutaneous Daily   escitalopram   20 mg Oral Daily   ezetimibe   10 mg Oral QHS   feeding supplement  237 mL Oral BID BM   furosemide   40 mg Oral Daily   gabapentin   600 mg Oral BID   insulin  aspart  0-15 Units Subcutaneous TID WC   insulin  aspart  0-5 Units Subcutaneous QHS   ipratropium-albuterol   3 mL Nebulization TID   levothyroxine   112 mcg Oral QAC breakfast   metFORMIN   500 mg Oral BID WC   mirtazapine   45 mg Oral QHS   montelukast   10 mg Oral QHS   multivitamin with minerals  1 tablet Oral Daily   nebivolol   10 mg Oral  QHS   nicotine   21 mg Transdermal Daily   pantoprazole   40 mg Oral Daily   predniSONE   40 mg Oral Q breakfast   ticagrelor   60 mg Oral BID   varenicline   1 mg Oral BID WC   Continuous Infusions:  cefTRIAXone  (ROCEPHIN )  IV Stopped (01/09/24 2303)     LOS: 0 days    Time spent: 35 mins    Darcel Dawley, MD [facility]  If 7PM-7AM, please contact night-coverage

## 2024-01-10 NOTE — Plan of Care (Signed)

## 2024-01-11 DIAGNOSIS — J441 Chronic obstructive pulmonary disease with (acute) exacerbation: Secondary | ICD-10-CM | POA: Diagnosis not present

## 2024-01-11 LAB — CBC
HCT: 34.5 % — ABNORMAL LOW (ref 36.0–46.0)
Hemoglobin: 10.9 g/dL — ABNORMAL LOW (ref 12.0–15.0)
MCH: 30.3 pg (ref 26.0–34.0)
MCHC: 31.6 g/dL (ref 30.0–36.0)
MCV: 95.8 fL (ref 80.0–100.0)
Platelets: 448 10*3/uL — ABNORMAL HIGH (ref 150–400)
RBC: 3.6 MIL/uL — ABNORMAL LOW (ref 3.87–5.11)
RDW: 14 % (ref 11.5–15.5)
WBC: 15.1 10*3/uL — ABNORMAL HIGH (ref 4.0–10.5)
nRBC: 0 % (ref 0.0–0.2)

## 2024-01-11 LAB — BASIC METABOLIC PANEL WITH GFR
Anion gap: 12 (ref 5–15)
BUN: 18 mg/dL (ref 8–23)
CO2: 31 mmol/L (ref 22–32)
Calcium: 9.4 mg/dL (ref 8.9–10.3)
Chloride: 95 mmol/L — ABNORMAL LOW (ref 98–111)
Creatinine, Ser: 0.74 mg/dL (ref 0.44–1.00)
GFR, Estimated: >60 mL/min (ref >60)
Glucose, Bld: 123 mg/dL — ABNORMAL HIGH (ref 70–99)
Potassium: 3.6 mmol/L (ref 3.5–5.1)
Sodium: 138 mmol/L (ref 135–145)

## 2024-01-11 LAB — GLUCOSE, CAPILLARY: Glucose-Capillary: 134 mg/dL — ABNORMAL HIGH (ref 70–99)

## 2024-01-11 NOTE — TOC Transition Note (Signed)
 Transition of Care [facility]) - Discharge Note   Patient Details  Name: [name] [name] MRN: [id number] Date of Birth: 1956-02-23  [facility] CM/SW Contact:  Robynn Eileen Hoose, RN Phone Number: 01/11/2024, 11:08 AM   Clinical Narrative:  Patient is being discharged today. Katina with [facility] made aware, pt will need new HH orders with face to face due to being inpatient status. Provider made aware.     Final next level of care: Home w Home Health Services Barriers to Discharge: No Barriers Identified   Patient Goals and CMS Choice            Discharge Placement                       Discharge Plan and Services Additional resources added to the After Visit Summary for                                       Social Drivers of Health (SDOH) Interventions SDOH Screenings   Food Insecurity: No Food Insecurity (01/09/2024)  Housing: Low Risk  (01/09/2024)  Transportation Needs: No Transportation Needs (01/09/2024)  Utilities: Not At Risk (01/09/2024)  Alcohol Screen: Low Risk  (08/07/2023)  Depression (PHQ2-9): Medium Risk (12/11/2023)  Financial Resource Strain: Medium Risk (08/07/2023)  Physical Activity: Inactive (08/07/2023)  Social Connections: Moderately Integrated (01/09/2024)  Recent Concern: Social Connections - Moderately Isolated (12/04/2023)  Stress: No Stress Concern Present (08/07/2023)  Tobacco Use: High Risk (01/09/2024)  Health Literacy: Inadequate Health Literacy (08/07/2023)     Readmission Risk Interventions    06/20/2023    3:17 PM  Readmission Risk Prevention Plan  Transportation Screening Complete  HRI or Home Care Consult Complete  Social Work Consult for Recovery Care Planning/Counseling Complete  Palliative Care Screening Not Applicable  Medication Review Oceanographer) Complete

## 2024-01-11 NOTE — Plan of Care (Signed)
 Problem: Education: Goal: Ability to describe self-care measures that may prevent or decrease complications (Diabetes Survival Skills Education) will improve 01/11/2024 1127 by Bernardo Boast, LPN Outcome: Adequate for Discharge 01/11/2024 1127 by Bernardo Boast, LPN Outcome: Adequate for Discharge Goal: Individualized Educational Video(s) 01/11/2024 1127 by Bernardo Boast, LPN Outcome: Adequate for Discharge 01/11/2024 1127 by Bernardo Boast, LPN Outcome: Adequate for Discharge   Problem: Coping: Goal: Ability to adjust to condition or change in health will improve 01/11/2024 1127 by Bernardo Boast, LPN Outcome: Adequate for Discharge 01/11/2024 1127 by Bernardo Boast, LPN Outcome: Adequate for Discharge   Problem: Fluid Volume: Goal: Ability to maintain a balanced intake and output will improve 01/11/2024 1127 by Bernardo Boast, LPN Outcome: Adequate for Discharge 01/11/2024 1127 by Bernardo Boast, LPN Outcome: Adequate for Discharge   Problem: Health Behavior/Discharge Planning: Goal: Ability to identify and utilize available resources and services will improve 01/11/2024 1127 by Bernardo Boast, LPN Outcome: Adequate for Discharge 01/11/2024 1127 by Bernardo Boast, LPN Outcome: Adequate for Discharge Goal: Ability to manage health-related needs will improve 01/11/2024 1127 by Bernardo Boast, LPN Outcome: Adequate for Discharge 01/11/2024 1127 by Bernardo Boast, LPN Outcome: Adequate for Discharge   Problem: Metabolic: Goal: Ability to maintain appropriate glucose levels will improve 01/11/2024 1127 by Bernardo Boast, LPN Outcome: Adequate for Discharge 01/11/2024 1127 by Bernardo Boast, LPN Outcome: Adequate for Discharge   Problem: Nutritional: Goal: Maintenance of adequate nutrition will improve 01/11/2024 1127 by Bernardo Boast, LPN Outcome: Adequate for Discharge 01/11/2024 1127 by Bernardo Boast, LPN Outcome: Adequate for Discharge Goal:  Progress toward achieving an optimal weight will improve 01/11/2024 1127 by Bernardo Boast, LPN Outcome: Adequate for Discharge 01/11/2024 1127 by Bernardo Boast, LPN Outcome: Adequate for Discharge   Problem: Skin Integrity: Goal: Risk for impaired skin integrity will decrease 01/11/2024 1127 by Bernardo Boast, LPN Outcome: Adequate for Discharge 01/11/2024 1127 by Bernardo Boast, LPN Outcome: Adequate for Discharge   Problem: Tissue Perfusion: Goal: Adequacy of tissue perfusion will improve 01/11/2024 1127 by Bernardo Boast, LPN Outcome: Adequate for Discharge 01/11/2024 1127 by Bernardo Boast, LPN Outcome: Adequate for Discharge   Problem: Education: Goal: Knowledge of General Education information will improve Description: Including pain rating scale, medication(s)/side effects and non-pharmacologic comfort measures 01/11/2024 1127 by Bernardo Boast, LPN Outcome: Adequate for Discharge 01/11/2024 1127 by Bernardo Boast, LPN Outcome: Adequate for Discharge   Problem: Health Behavior/Discharge Planning: Goal: Ability to manage health-related needs will improve 01/11/2024 1127 by Bernardo Boast, LPN Outcome: Adequate for Discharge 01/11/2024 1127 by Bernardo Boast, LPN Outcome: Adequate for Discharge   Problem: Clinical Measurements: Goal: Ability to maintain clinical measurements within normal limits will improve 01/11/2024 1127 by Bernardo Boast, LPN Outcome: Adequate for Discharge 01/11/2024 1127 by Bernardo Boast, LPN Outcome: Adequate for Discharge Goal: Will remain free from infection 01/11/2024 1127 by Bernardo Boast, LPN Outcome: Adequate for Discharge 01/11/2024 1127 by Bernardo Boast, LPN Outcome: Adequate for Discharge Goal: Diagnostic test results will improve 01/11/2024 1127 by Bernardo Boast, LPN Outcome: Adequate for Discharge 01/11/2024 1127 by Bernardo Boast, LPN Outcome: Adequate for Discharge Goal: Respiratory complications will  improve 01/11/2024 1127 by Bernardo Boast, LPN Outcome: Adequate for Discharge 01/11/2024 1127 by Bernardo Boast, LPN Outcome: Adequate for Discharge Goal: Cardiovascular complication will be avoided 01/11/2024 1127 by Bernardo Boast, LPN Outcome: Adequate for Discharge 01/11/2024 1127 by Bernardo Boast, LPN Outcome: Adequate for Discharge   Problem: Activity: Goal: Risk for activity intolerance will decrease 01/11/2024 1127 by Bernardo Boast, LPN Outcome: Adequate for Discharge 01/11/2024  1127 by Bernardo Boast, LPN Outcome: Adequate for Discharge   Problem: Nutrition: Goal: Adequate nutrition will be maintained 01/11/2024 1127 by Bernardo Boast, LPN Outcome: Adequate for Discharge 01/11/2024 1127 by Bernardo Boast, LPN Outcome: Adequate for Discharge   Problem: Coping: Goal: Level of anxiety will decrease 01/11/2024 1127 by Bernardo Boast, LPN Outcome: Adequate for Discharge 01/11/2024 1127 by Bernardo Boast, LPN Outcome: Adequate for Discharge   Problem: Elimination: Goal: Will not experience complications related to bowel motility 01/11/2024 1127 by Bernardo Boast, LPN Outcome: Adequate for Discharge 01/11/2024 1127 by Bernardo Boast, LPN Outcome: Adequate for Discharge Goal: Will not experience complications related to urinary retention 01/11/2024 1127 by Bernardo Boast, LPN Outcome: Adequate for Discharge 01/11/2024 1127 by Bernardo Boast, LPN Outcome: Adequate for Discharge   Problem: Pain Managment: Goal: General experience of comfort will improve and/or be controlled 01/11/2024 1127 by Bernardo Boast, LPN Outcome: Adequate for Discharge 01/11/2024 1127 by Bernardo Boast, LPN Outcome: Adequate for Discharge   Problem: Safety: Goal: Ability to remain free from injury will improve 01/11/2024 1127 by Bernardo Boast, LPN Outcome: Adequate for Discharge 01/11/2024 1127 by Bernardo Boast, LPN Outcome: Adequate for Discharge   Problem: Skin  Integrity: Goal: Risk for impaired skin integrity will decrease 01/11/2024 1127 by Bernardo Boast, LPN Outcome: Adequate for Discharge 01/11/2024 1127 by Bernardo Boast, LPN Outcome: Adequate for Discharge   Problem: Education: Goal: Knowledge of disease or condition will improve 01/11/2024 1127 by Bernardo Boast, LPN Outcome: Adequate for Discharge 01/11/2024 1127 by Bernardo Boast, LPN Outcome: Adequate for Discharge Goal: Knowledge of the prescribed therapeutic regimen will improve 01/11/2024 1127 by Bernardo Boast, LPN Outcome: Adequate for Discharge 01/11/2024 1127 by Bernardo Boast, LPN Outcome: Adequate for Discharge Goal: Individualized Educational Video(s) 01/11/2024 1127 by Bernardo Boast, LPN Outcome: Adequate for Discharge 01/11/2024 1127 by Bernardo Boast, LPN Outcome: Adequate for Discharge   Problem: Activity: Goal: Ability to tolerate increased activity will improve 01/11/2024 1127 by Bernardo Boast, LPN Outcome: Adequate for Discharge 01/11/2024 1127 by Bernardo Boast, LPN Outcome: Adequate for Discharge Goal: Will verbalize the importance of balancing activity with adequate rest periods 01/11/2024 1127 by Bernardo Boast, LPN Outcome: Adequate for Discharge 01/11/2024 1127 by Bernardo Boast, LPN Outcome: Adequate for Discharge   Problem: Respiratory: Goal: Ability to maintain a clear airway will improve 01/11/2024 1127 by Bernardo Boast, LPN Outcome: Adequate for Discharge 01/11/2024 1127 by Bernardo Boast, LPN Outcome: Adequate for Discharge Goal: Levels of oxygenation will improve Outcome: Adequate for Discharge Goal: Ability to maintain adequate ventilation will improve Outcome: Adequate for Discharge

## 2024-01-11 NOTE — Discharge Instructions (Signed)
 Advised to continue prednisone  as recently prescribed and complete the course

## 2024-01-11 NOTE — Care Management (Cosign Needed)
 Home Health Orders (From admission, onward)     Start     Ordered   01/11/24 1148  Home Health  At discharge       Comments: Resumption of care  Question Answer Comment  To provide the following care/treatments PT   To provide the following care/treatments OT   To provide the following care/treatments Social work      01/11/24 1149           Added SW

## 2024-01-11 NOTE — Progress Notes (Signed)
 Reviewed AVS, patient expressed understanding of medications, MD follow up reviewed.   Patient states all belongings brought to the hospital at time of admission are accounted for and packed to take home.   Pt transported to entrance A where family member was waiting in vehicle to transport home.

## 2024-01-11 NOTE — Discharge Summary (Signed)
 Physician Discharge Summary  [name] [name] FMW:[id number] DOB: 1956/03/17 DOA: 01/08/2024  PCP: Vicci Barnie [name], MD  Admit date: 01/08/2024  Discharge date: 01/11/2024  Admitted From: Home  Disposition:  Home.  Recommendations for Outpatient Follow-up:  Follow up with PCP in 1-2 weeks. Please obtain BMP/CBC in one week. Advised to continue prednisone  as recently prescribed and complete the course.  Home Health: None Equipment/Devices:Home oxygen   Discharge Condition: Stable CODE STATUS:Full code Diet recommendation: Heart Healthy   Brief [facility] Course: This 68 y.o. female with PMH significant of COPD, coronary artery disease, chronic kidney disease stage II, non-insulin -dependent diabetes, hypothyroidism, essential hypertension, hyperlipidemia, history of lung nodules, GERD, and multiple other medical problems who is on home oxygen  at 2 to 3 L/min.  Patient is a lifelong tobacco smoker.  She is taking her inhalers and inhaled steroids at home but lost power.  Patient started having progressive shortness of breath yesterday with wheezing.  Unable to use oxygen  since she had no power.  She came to the ER where she was found to be hypoxic requiring more oxygen  than her usual 2 to 3 L.  Also noted to have diffuse expiratory wheezing.  Mild hypokalemia.  Patient being admitted with acute exacerbation of COPD.  Patient was continued on IV Solu-Medrol , scheduled and as needed nebulized bronchodilators.  She has made significant improvement. She is back to her baseline oxygen  requirement.  Felt much better and she wants to be discharged.   Discharge Diagnoses:  Principal Problem:   COPD exacerbation (HCC) Active Problems:   Hypothyroidism   TOBACCO ABUSE   Essential hypertension   GERD (gastroesophageal reflux disease)   Anxiety and depression   Major depressive disorder, recurrent, in full remission with anxious distress (HCC)   History of CAD (coronary artery disease)   CKD  (chronic kidney disease) stage 2, GFR 60-89 ml/min   Hypokalemia  Acute on chronic hypoxic respiratory failure: COPD with acute exacerbation: Patient usually remains on 2 to 3 L of supplemental oxygen  now requiring more due to COPD flare.   Probably exacerbated by patient's inability use oxygen  at home.   Continue IV steroids, antibiotics, breathing treatments.   Patient back on oxygen  at 3 to 4 L.  Titrate her down to 2 L. Patient has made significant improvement,  advised to complete the course for prednisone  taper.  Tobacco abuse:  Continue Nicotine  patch.   Non-insulin -dependent diabetes:  Continue sliding scale insulin .   Hypothyroidism:  Continue levothyroxine .   Hyperlipidemia:  Continue with statin.   Essential hypertension:  Continue home blood pressure medications   Anxiety with depression:  Continue Klonopin  and other home regime.   History of coronary artery disease:  Stable > no decompensation.    Discharge Instructions  Discharge Instructions     Call MD for:  difficulty breathing, headache or visual disturbances   Complete by: As directed    Call MD for:  persistant dizziness or light-headedness   Complete by: As directed    Call MD for:  persistant nausea and vomiting   Complete by: As directed    Diet - low sodium heart healthy   Complete by: As directed    Diet Carb Modified   Complete by: As directed    Discharge instructions   Complete by: As directed    Advised to follow-up with primary care physician in 1 week. Advised to continue prednisone  as prescribed recently and complete the course.   Increase activity slowly   Complete by:  As directed       Allergies as of 01/11/2024       Reactions   Avocado Anaphylaxis   Latex Shortness Of Breath, Rash   Codeine  Nausea Only        Medication List     STOP taking these medications    azithromycin  250 MG tablet Commonly known as: ZITHROMAX        TAKE these medications     alendronate  70 MG tablet Commonly known as: FOSAMAX  Take 1 tablet (70 mg total) by mouth every 7 (seven) days. Take with a full glass of water on an empty stomach.   aspirin  EC 81 MG tablet Take 81 mg by mouth in the morning.   atorvastatin  80 MG tablet Commonly known as: LIPITOR  Take 1 tablet (80 mg total) by mouth daily.   busPIRone  15 MG tablet Commonly known as: BUSPAR  Take 2 tablets (30 mg total) by mouth in the morning AND 1 tablet (15 mg total) daily at 12 noon AND 1 tablet (15 mg total) every evening.   clonazePAM  0.5 MG tablet Commonly known as: KlonoPIN  Take 1 tablet (0.5 mg total) by mouth 3 (three) times daily as needed for anxiety. What changed: when to take this   doxepin  75 MG capsule Commonly known as: SINEQUAN  Take 1 capsule (75 mg total) by mouth at bedtime.   escitalopram  20 MG tablet Commonly known as: LEXAPRO  Take 1 tablet (20 mg total) by mouth daily.   ezetimibe  10 MG tablet Commonly known as: ZETIA  Take 1 tablet (10 mg total) by mouth daily.   furosemide  40 MG tablet Commonly known as: LASIX  Take 1 tablet (40 mg total) by mouth daily.   gabapentin  600 MG tablet Commonly known as: NEURONTIN  Take 1 tablet (600 mg total) by mouth 2 (two) times daily.   ipratropium-albuterol  0.5-2.5 (3) MG/3ML Soln Commonly known as: DUONEB Take 3 mLs by nebulization every 8 (eight) hours.   levothyroxine  112 MCG tablet Commonly known as: SYNTHROID  Take 1 tablet (112 mcg total) by mouth daily.   metFORMIN  500 MG tablet Commonly known as: GLUCOPHAGE  Take 1 tablet (500 mg total) by mouth 2 (two) times daily with a meal.   mirtazapine  45 MG tablet Commonly known as: REMERON  Take 1 tablet (45 mg total) by mouth at bedtime.   montelukast  10 MG tablet Commonly known as: SINGULAIR  Take 1 tablet (10 mg total) by mouth at bedtime.   nebivolol  10 MG tablet Commonly known as: Bystolic  Take 1 tablet (10 mg total) by mouth daily.   nitroGLYCERIN  0.4 MG SL  tablet Commonly known as: NITROSTAT  Place 1 tablet (0.4 mg total) under the tongue every 5 (five) minutes as needed for chest pain.   omeprazole  40 MG capsule Commonly known as: PRILOSEC Take 1 capsule (40 mg total) by mouth 2 (two) times daily before a meal.   OXYGEN  Inhale 2-3 L/min into the lungs continuous.   predniSONE  10 MG tablet Commonly known as: DELTASONE  Take 4 tablets (40 mg total) by mouth daily with breakfast for 3 days, THEN 3 tablets (30 mg total) daily with breakfast for 3 days, THEN 2 tablets (20 mg total) daily with breakfast for 3 days, THEN 1 tablet (10 mg total) daily with breakfast for 3 days. Start taking on: January 07, 2024   ticagrelor  60 MG Tabs tablet Commonly known as: Brilinta  Take 1 tablet (60 mg total) by mouth 2 (two) times daily.   Trelegy Ellipta  200-62.5-25 MCG/ACT Aepb Generic drug: Fluticasone -Umeclidin-Vilant Inhale 1  puff into the lungs daily. What changed: when to take this   varenicline  1 MG tablet Commonly known as: CHANTIX  Take 1 tablet (1 mg total) by mouth 2 (two) times daily.   Ventolin  HFA 108 (90 Base) MCG/ACT inhaler Generic drug: albuterol  INHALE 2 PUFFS into THE lungs EVERY 6 HOURS AS NEEDED wheezing AND SHORTNESS OF BREATH        Follow-up Information     Vicci Barnie NOVAK, MD Follow up in 1 week(s).   Specialty: Internal Medicine Contact information: [address] [place] [address] [place] [address] [phone]                Allergies  Allergen Reactions   Avocado Anaphylaxis   Latex Shortness Of Breath and Rash   Codeine  Nausea Only    Consultations: None   Procedures/Studies: DG Chest Port 1 View Result Date: 01/08/2024 CLINICAL DATA:  Dyspnea EXAM: PORTABLE CHEST 1 VIEW COMPARISON:  12/04/2023 multiple remote right rib fractures noted. Healed left FINDINGS: Mild bibasilar atelectasis. Lungs are otherwise clear. No pneumothorax or pleural effusion. Cardiac size within normal limits. Pulmonary  vascularity is normal. Osseous structures are age-appropriate. Clavicular mid-diaphyseal fracture. No acute bone abnormality. IMPRESSION: No active disease. Electronically Signed   By: Dorethia Molt M.D.   On: 01/08/2024 21:08    Subjective: Patient was seen and examined at bedside.  Overnight events noted.   Patient reports doing much better and wants to be discharged home.  Discharge Exam: Vitals:   01/11/24 0839 01/11/24 0910  BP:  (!) 114/52  Pulse: 73 75  Resp: 16   Temp:    SpO2: 98% 97%   Vitals:   01/10/24 2021 01/11/24 0544 01/11/24 0839 01/11/24 0910  BP:  (!) 114/59  (!) 114/52  Pulse: 61 67 73 75  Resp: 16  16   Temp:  98.6 F (37 C)    TempSrc:      SpO2: 100% 100% 98% 97%  Weight:      Height:        General: Pt is alert, awake, not in acute distress Cardiovascular: RRR, S1/S2 +, no rubs, no gallops Respiratory: CTA bilaterally, no wheezing, no rhonchi Abdominal: Soft, NT, ND, bowel sounds + Extremities: no edema, no cyanosis    The results of significant diagnostics from this hospitalization (including imaging, microbiology, ancillary and laboratory) are listed below for reference.     Microbiology: Recent Results (from the past 240 hours)  Resp panel by RT-PCR (RSV, Flu A&B, Covid) Anterior Nasal Swab     Status: None   Collection Time: 01/08/24  8:27 PM   Specimen: Anterior Nasal Swab  Result Value Ref Range Status   SARS Coronavirus 2 by RT PCR NEGATIVE NEGATIVE Final   Influenza A by PCR NEGATIVE NEGATIVE Final   Influenza B by PCR NEGATIVE NEGATIVE Final    Comment: (NOTE) The Xpert Xpress SARS-CoV-2/FLU/RSV plus assay is intended as an aid in the diagnosis of influenza from Nasopharyngeal swab specimens and should not be used as a sole basis for treatment. Nasal washings and aspirates are unacceptable for Xpert Xpress SARS-CoV-2/FLU/RSV testing.  Fact Sheet for Patients: BloggerCourse.com  Fact Sheet for  Healthcare Providers: SeriousBroker.it  This test is not yet approved or cleared by the [place]  FDA and has been authorized for detection and/or diagnosis of SARS-CoV-2 by FDA under an Emergency Use Authorization (EUA). This EUA will remain in effect (meaning this test can be used) for the duration of the COVID-19  declaration under Section 564(b)(1) of the Act, 21 U.S.C. section 360bbb-3(b)(1), unless the authorization is terminated or revoked.     Resp Syncytial Virus by PCR NEGATIVE NEGATIVE Final    Comment: (NOTE) Fact Sheet for Patients: BloggerCourse.com  Fact Sheet for Healthcare Providers: SeriousBroker.it  This test is not yet approved or cleared by the [place]  FDA and has been authorized for detection and/or diagnosis of SARS-CoV-2 by FDA under an Emergency Use Authorization (EUA). This EUA will remain in effect (meaning this test can be used) for the duration of the COVID-19 declaration under Section 564(b)(1) of the Act, 21 U.S.C. section 360bbb-3(b)(1), unless the authorization is terminated or revoked.  Performed at [facility], [address] [place], [place] [address]      Labs: BNP (last 3 results) Recent Labs    11/25/23 1947 12/04/23 0045 12/05/23 0547  BNP 84.1 106.8* 72.9   Basic Metabolic Panel: Recent Labs  Lab 01/08/24 2027 01/09/24 0532 01/11/24 0929  NA 136 139 138  K 3.4* 3.4* 3.6  CL 93* 96* 95*  CO2 30 30 31   GLUCOSE 153* 236* 123*  BUN 14 14 18   CREATININE 0.86 0.87 0.74  CALCIUM  9.2 9.5 9.4  MG 1.7  --   --    Liver Function Tests: Recent Labs  Lab 01/08/24 2027 01/09/24 0532  AST 18 19  ALT 17 16  ALKPHOS 72 67  BILITOT 0.4 0.2  PROT 6.6 6.2*  ALBUMIN 3.4* 3.2*   No results for input(s): LIPASE, AMYLASE in the last 168 hours. No results for input(s): AMMONIA in the last 168 hours. CBC: Recent Labs  Lab  01/08/24 2027 01/09/24 0532 01/11/24 0929  WBC 13.3* 13.7* 15.1*  NEUTROABS 9.8*  --   --   HGB 12.0 11.3* 10.9*  HCT 38.0 35.9* 34.5*  MCV 96.7 97.3 95.8  PLT 459* 462* 448*   Cardiac Enzymes: No results for input(s): CKTOTAL, CKMB, CKMBINDEX, TROPONINI in the last 168 hours. BNP: Invalid input(s): POCBNP CBG: Recent Labs  Lab 01/10/24 0800 01/10/24 1201 01/10/24 1620 01/10/24 1945 01/11/24 0811  GLUCAP 118* 101* 120* 199* 134*   D-Dimer No results for input(s): DDIMER in the last 72 hours. Hgb A1c No results for input(s): HGBA1C in the last 72 hours. Lipid Profile No results for input(s): CHOL, HDL, LDLCALC, TRIG, CHOLHDL, LDLDIRECT in the last 72 hours. Thyroid  function studies No results for input(s): TSH, T4TOTAL, T3FREE, THYROIDAB in the last 72 hours.  Invalid input(s): FREET3 Anemia work up No results for input(s): VITAMINB12, FOLATE, FERRITIN, TIBC, IRON , RETICCTPCT in the last 72 hours. Urinalysis    Component Value Date/Time   COLORURINE STRAW (A) 11/26/2023 1250   APPEARANCEUR CLEAR 11/26/2023 1250   LABSPEC 1.005 11/26/2023 1250   PHURINE 6.0 11/26/2023 1250   GLUCOSEU NEGATIVE 11/26/2023 1250   HGBUR NEGATIVE 11/26/2023 1250   BILIRUBINUR NEGATIVE 11/26/2023 1250   BILIRUBINUR negative 11/24/2020 1018   KETONESUR NEGATIVE 11/26/2023 1250   PROTEINUR NEGATIVE 11/26/2023 1250   UROBILINOGEN 0.2 11/24/2020 1018   UROBILINOGEN 0.2 08/09/2014 1028   NITRITE NEGATIVE 11/26/2023 1250   LEUKOCYTESUR NEGATIVE 11/26/2023 1250   Sepsis Labs Recent Labs  Lab 01/08/24 2027 01/09/24 0532 01/11/24 0929  WBC 13.3* 13.7* 15.1*   Microbiology Recent Results (from the past 240 hours)  Resp panel by RT-PCR (RSV, Flu A&B, Covid) Anterior Nasal Swab     Status: None   Collection Time: 01/08/24  8:27 PM   Specimen: Anterior Nasal  Swab  Result Value Ref Range Status   SARS Coronavirus 2 by RT PCR NEGATIVE  NEGATIVE Final   Influenza A by PCR NEGATIVE NEGATIVE Final   Influenza B by PCR NEGATIVE NEGATIVE Final    Comment: (NOTE) The Xpert Xpress SARS-CoV-2/FLU/RSV plus assay is intended as an aid in the diagnosis of influenza from Nasopharyngeal swab specimens and should not be used as a sole basis for treatment. Nasal washings and aspirates are unacceptable for Xpert Xpress SARS-CoV-2/FLU/RSV testing.  Fact Sheet for Patients: BloggerCourse.com  Fact Sheet for Healthcare Providers: SeriousBroker.it  This test is not yet approved or cleared by the [place]  FDA and has been authorized for detection and/or diagnosis of SARS-CoV-2 by FDA under an Emergency Use Authorization (EUA). This EUA will remain in effect (meaning this test can be used) for the duration of the COVID-19 declaration under Section 564(b)(1) of the Act, 21 U.S.C. section 360bbb-3(b)(1), unless the authorization is terminated or revoked.     Resp Syncytial Virus by PCR NEGATIVE NEGATIVE Final    Comment: (NOTE) Fact Sheet for Patients: BloggerCourse.com  Fact Sheet for Healthcare Providers: SeriousBroker.it  This test is not yet approved or cleared by the [place]  FDA and has been authorized for detection and/or diagnosis of SARS-CoV-2 by FDA under an Emergency Use Authorization (EUA). This EUA will remain in effect (meaning this test can be used) for the duration of the COVID-19 declaration under Section 564(b)(1) of the Act, 21 U.S.C. section 360bbb-3(b)(1), unless the authorization is terminated or revoked.  Performed at [facility], [address] [place], [place] [address]      Time coordinating discharge: Over 30 minutes  SIGNED:   Darcel Dawley, MD  [facility] 01/11/2024, 2:25 PM Pager   If 7PM-7AM, please contact night-coverage

## 2024-01-12 ENCOUNTER — Emergency Department (HOSPITAL_COMMUNITY)

## 2024-01-12 ENCOUNTER — Other Ambulatory Visit: Payer: Self-pay

## 2024-01-12 ENCOUNTER — Telehealth: Payer: Self-pay | Admitting: *Deleted

## 2024-01-12 ENCOUNTER — Encounter (HOSPITAL_COMMUNITY): Payer: Self-pay | Admitting: Emergency Medicine

## 2024-01-12 ENCOUNTER — Emergency Department (HOSPITAL_COMMUNITY): Admission: EM | Admit: 2024-01-12 | Discharge: 2024-01-12 | Disposition: A | Discharge status: Home / Self Care

## 2024-01-12 DIAGNOSIS — Z7982 Long term (current) use of aspirin: Secondary | ICD-10-CM | POA: Diagnosis not present

## 2024-01-12 DIAGNOSIS — Z91040 Latex allergy status: Secondary | ICD-10-CM | POA: Insufficient documentation

## 2024-01-12 DIAGNOSIS — J449 Chronic obstructive pulmonary disease, unspecified: Secondary | ICD-10-CM | POA: Diagnosis not present

## 2024-01-12 DIAGNOSIS — J441 Chronic obstructive pulmonary disease with (acute) exacerbation: Secondary | ICD-10-CM | POA: Diagnosis not present

## 2024-01-12 DIAGNOSIS — I10 Essential (primary) hypertension: Secondary | ICD-10-CM | POA: Diagnosis not present

## 2024-01-12 DIAGNOSIS — R0602 Shortness of breath: Secondary | ICD-10-CM | POA: Diagnosis present

## 2024-01-12 DIAGNOSIS — J189 Pneumonia, unspecified organism: Secondary | ICD-10-CM | POA: Diagnosis not present

## 2024-01-12 DIAGNOSIS — R062 Wheezing: Secondary | ICD-10-CM | POA: Diagnosis not present

## 2024-01-12 DIAGNOSIS — R918 Other nonspecific abnormal finding of lung field: Secondary | ICD-10-CM | POA: Diagnosis not present

## 2024-01-12 DIAGNOSIS — Z7951 Long term (current) use of inhaled steroids: Secondary | ICD-10-CM | POA: Insufficient documentation

## 2024-01-12 DIAGNOSIS — R079 Chest pain, unspecified: Secondary | ICD-10-CM | POA: Diagnosis not present

## 2024-01-12 LAB — BASIC METABOLIC PANEL WITH GFR
Anion gap: 15 (ref 5–15)
BUN: 18 mg/dL (ref 8–23)
CO2: 31 mmol/L (ref 22–32)
Calcium: 10.4 mg/dL — ABNORMAL HIGH (ref 8.9–10.3)
Chloride: 91 mmol/L — ABNORMAL LOW (ref 98–111)
Creatinine, Ser: 0.83 mg/dL (ref 0.44–1.00)
GFR, Estimated: >60 mL/min (ref >60)
Glucose, Bld: 148 mg/dL — ABNORMAL HIGH (ref 70–99)
Potassium: 4 mmol/L (ref 3.5–5.1)
Sodium: 137 mmol/L (ref 135–145)

## 2024-01-12 LAB — TROPONIN I (HIGH SENSITIVITY)
Troponin I (High Sensitivity): 36 ng/L — ABNORMAL HIGH (ref <18)
Troponin I (High Sensitivity): 35 ng/L — ABNORMAL HIGH (ref <18)

## 2024-01-12 LAB — CBC
HCT: 39.7 % (ref 36.0–46.0)
Hemoglobin: 12.8 g/dL (ref 12.0–15.0)
MCH: 30.6 pg (ref 26.0–34.0)
MCHC: 32.2 g/dL (ref 30.0–36.0)
MCV: 95 fL (ref 80.0–100.0)
Platelets: 561 10*3/uL — ABNORMAL HIGH (ref 150–400)
RBC: 4.18 MIL/uL (ref 3.87–5.11)
RDW: 14 % (ref 11.5–15.5)
WBC: 22.6 10*3/uL — ABNORMAL HIGH (ref 4.0–10.5)
nRBC: 0 % (ref 0.0–0.2)

## 2024-01-12 MED ORDER — IPRATROPIUM-ALBUTEROL 0.5-2.5 (3) MG/3ML IN SOLN
3.0000 mL | Freq: Once | RESPIRATORY_TRACT | Status: AC
  Administered 2024-01-12: 3 mL via RESPIRATORY_TRACT
  Filled 2024-01-12: qty 3

## 2024-01-12 NOTE — ED Provider Notes (Signed)
 [name] EMERGENCY DEPARTMENT AT [facility] Provider Note   CSN: [id number] Arrival date & time: 01/12/24  1115     Patient presents with: Shortness of Breath   [name] [name] is a 68 y.o. female.   This is a 68 year old female presenting emergency department for shortness of breath.  Discharge from the hospital yesterday for COPD exacerbation.  She is on 2 L of oxygen  at baseline.  Woke this morning had a couple cigarettes, was in bed and had worsening shortness of breath.  Feeling improved after breathing treatment, steroids and magnesium  given to her by EMS.  She did endorse some chest discomfort earlier, but has resolved   Shortness of Breath      Prior to Admission medications   Medication Sig Start Date End Date Taking? Authorizing Provider  albuterol  (VENTOLIN  HFA) 108 (90 Base) MCG/ACT inhaler INHALE 2 PUFFS into THE lungs EVERY 6 HOURS AS NEEDED wheezing AND SHORTNESS OF BREATH 10/15/23   Vicci Barnie [name], MD  alendronate  (FOSAMAX ) 70 MG tablet Take 1 tablet (70 mg total) by mouth every 7 (seven) days. Take with a full glass of water on an empty stomach. 10/25/23   Vicci Barnie [name], MD  aspirin  EC 81 MG tablet Take 81 mg by mouth in the morning.    [provider]  atorvastatin  (LIPITOR ) 80 MG tablet Take 1 tablet (80 mg total) by mouth daily. 09/22/23   Daneen Damien BROCKS, NP  busPIRone  (BUSPAR ) 15 MG tablet Take 2 tablets (30 mg total) by mouth in the morning AND 1 tablet (15 mg total) daily at 12 noon AND 1 tablet (15 mg total) every evening. 11/17/23   Nwoko, Uchenna E, PA  clonazePAM  (KLONOPIN ) 0.5 MG tablet Take 1 tablet (0.5 mg total) by mouth 3 (three) times daily as needed for anxiety. Patient taking differently: Take 0.5 mg by mouth 2 (two) times daily. 09/26/23   Nwoko, Uchenna E, PA  doxepin  (SINEQUAN ) 75 MG capsule Take 1 capsule (75 mg total) by mouth at bedtime. 11/17/23   Nwoko, Uchenna E, PA  escitalopram  (LEXAPRO ) 20 MG tablet Take 1 tablet (20  mg total) by mouth daily. 11/17/23   Nwoko, Uchenna E, PA  ezetimibe  (ZETIA ) 10 MG tablet Take 1 tablet (10 mg total) by mouth daily. 07/04/23   Meng, Hao, PA  Fluticasone -Umeclidin-Vilant (TRELEGY ELLIPTA ) 200-62.5-25 MCG/ACT AEPB Inhale 1 puff into the lungs daily. 08/07/23   Kara Dorn [name], MD  furosemide  (LASIX ) 40 MG tablet Take 1 tablet (40 mg total) by mouth daily. 12/11/23   Vicci Barnie [name], MD  gabapentin  (NEURONTIN ) 600 MG tablet Take 1 tablet (600 mg total) by mouth 2 (two) times daily. 12/04/23   Vicci Barnie [name], MD  ipratropium-albuterol  (DUONEB) 0.5-2.5 (3) MG/3ML SOLN Take 3 mLs by nebulization every 8 (eight) hours. 11/27/23   Rosan Dayton BROCKS, DO  levothyroxine  (SYNTHROID ) 112 MCG tablet Take 1 tablet (112 mcg total) by mouth daily. 12/20/22   Vicci Barnie [name], MD  metFORMIN  (GLUCOPHAGE ) 500 MG tablet Take 1 tablet (500 mg total) by mouth 2 (two) times daily with a meal. 11/12/23   Vicci Barnie [name], MD  mirtazapine  (REMERON ) 45 MG tablet Take 1 tablet (45 mg total) by mouth at bedtime. 11/17/23   Nwoko, Uchenna E, PA  montelukast  (SINGULAIR ) 10 MG tablet Take 1 tablet (10 mg total) by mouth at bedtime. 11/11/23   Vicci Barnie [name], MD  nebivolol  (BYSTOLIC ) 10 MG tablet Take 1 tablet (10 mg total)  by mouth daily. 09/22/23   Daneen Damien BROCKS, NP  nitroGLYCERIN  (NITROSTAT ) 0.4 MG SL tablet Place 1 tablet (0.4 mg total) under the tongue every 5 (five) minutes as needed for chest pain. 09/18/22 08/31/24  Daneen Damien BROCKS, NP  omeprazole  (PRILOSEC) 40 MG capsule Take 1 capsule (40 mg total) by mouth 2 (two) times daily before a meal. 12/30/23   Danis, Victory LITTIE MOULD, MD  OXYGEN  Inhale 2-3 L/min into the lungs continuous.    [provider]  predniSONE  (DELTASONE ) 10 MG tablet Take 4 tablets (40 mg total) by mouth daily with breakfast for 3 days, THEN 3 tablets (30 mg total) daily with breakfast for 3 days, THEN 2 tablets (20 mg total) daily with breakfast for 3 days, THEN 1 tablet (10 mg  total) daily with breakfast for 3 days. 01/07/24 01/19/24  Kara Dorn NOVAK, MD  ticagrelor  (BRILINTA ) 60 MG TABS tablet Take 1 tablet (60 mg total) by mouth 2 (two) times daily. 12/11/23   Burnard Debby LABOR, MD  varenicline  (CHANTIX ) 1 MG tablet Take 1 tablet (1 mg total) by mouth 2 (two) times daily. 01/05/24   Kara Dorn NOVAK, MD    Allergies: Avocado, Latex, and Codeine     Review of Systems  Respiratory:  Positive for shortness of breath.     Updated Vital Signs BP (!) 113/51   Pulse 82   Temp 98.7 F (37.1 C) (Oral)   Resp 18   SpO2 100%   Physical Exam Vitals and nursing note reviewed.  Constitutional:      General: She is not in acute distress.    Appearance: She is not toxic-appearing.  HENT:     Head: Normocephalic.   Cardiovascular:     Rate and Rhythm: Normal rate and regular rhythm.  Pulmonary:     Effort: Pulmonary effort is normal.     Breath sounds: Wheezing present.  Chest:     Chest wall: No mass or tenderness.   Musculoskeletal:     Cervical back: Normal range of motion.     Right lower leg: No edema.     Left lower leg: No edema.   Skin:    General: Skin is warm.     Capillary Refill: Capillary refill takes less than 2 seconds.   Neurological:     Mental Status: She is alert and oriented to person, place, and time.   Psychiatric:        Mood and Affect: Mood normal.        Behavior: Behavior normal.     (all labs ordered are listed, but only abnormal results are displayed) Labs Reviewed  CBC - Abnormal; Notable for the following components:      Result Value   WBC 22.6 (*)    Platelets 561 (*)    All other components within normal limits  BASIC METABOLIC PANEL WITH GFR - Abnormal; Notable for the following components:   Chloride 91 (*)    Glucose, Bld 148 (*)    Calcium  10.4 (*)    All other components within normal limits  TROPONIN I (HIGH SENSITIVITY) - Abnormal; Notable for the following components:   Troponin I (High Sensitivity)  36 (*)    All other components within normal limits  TROPONIN I (HIGH SENSITIVITY) - Abnormal; Notable for the following components:   Troponin I (High Sensitivity) 35 (*)    All other components within normal limits    EKG: None  Radiology: DG Chest Portable 1 View  Result Date: 01/12/2024 CLINICAL DATA:  Fluid PD EXAM: PORTABLE CHEST 1 VIEW COMPARISON:  Chest x-ray performed 08/20/2018 FINDINGS: Mild hypoventilatory changes. Heart mediastinum are not significantly changed. Basilar scarring is suspected. Similar appearance of bony structures. IMPRESSION: 1. No focal infiltrate, pleural effusion, or pneumothorax. Electronically Signed   By: Maude Naegeli M.D.   On: 01/12/2024 12:09     Procedures   Medications Ordered in the ED  ipratropium-albuterol  (DUONEB) 0.5-2.5 (3) MG/3ML nebulizer solution 3 mL (3 mLs Nebulization Given 01/12/24 1214)    Clinical Course as of 01/12/24 1539  Mon Jan 12, 2024  1333 Patient feeling improved after EMS breathing treatment and DuoNeb here.  Feels like she is back to her baseline.  She is maintaining oxygen  saturation on her home 2 L.  Does not appear to be in respiratory distress.  Lungs largely clear.  Did complain of some chest discomfort on arrival.  EKG without STEMI on my independent interpretation.  Troponin is elevated, although mildly so at 36.  Will repeat troponin [TY]    Clinical Course User Index [TY] Neysa Caron PARAS, DO                                 Medical Decision Making This is a 68 year old female COPD on 2 L of oxygen  presenting emergency department for shortness of breath.  EMS  reported getting albuterol  10 mg, 125 Solu-Medrol , 2 g of magnesium .  Per chart review admitted and discharged yesterday for COPD exacerbation and appears that she is on 2 L of oxygen  at baseline.  Did give an additional breathing treatment here as she was still with some wheezing, but not in obvious respiratory distress, no significant increased work of  breathing, after breathing treatment notes that she is back to her normal breathing and feels ready to go home.  She did have a minor elevation in troponin, but is flat.  I suspect elevation secondary to tachycardia from albuterol  treatments.  No significant metabolic derangements.  Does have a leukocytosis of 22.6, but has been on steroids for the past several days and I suspect is reactionary.  I feel the patient again is appropriate for discharge back to home as she is not in respiratory distress, maintaining oxygen  saturation on 2 L.  Chest x-ray without pneumonia.  Patient is agreeable to plan.  Amount and/or Complexity of Data Reviewed Labs: ordered. Radiology: ordered. ECG/medicine tests: ordered.  Risk Prescription drug management.      Final diagnoses:  None    ED Discharge Orders     None          Neysa Caron PARAS, DO 01/12/24 1539

## 2024-01-12 NOTE — Discharge Instructions (Signed)
 Please follow-up with your outpatient doctors as planned when you were discharged from the hospital yesterday.  Return if develop fevers, chills, worsening chest pain, shortness of breath or any new or worsening symptoms that are concerning to you.

## 2024-01-12 NOTE — ED Triage Notes (Signed)
 Pt BIb [facility] from home. DC yest from here for aspiration PNA.  Sudden onset SOB worse than baseline.  Hx of COPD.   Wheezing and diminished in all fields. Better air movement after neb, wheezing still present.   5 alb, 1 mg Atrovent , 125 Solumedrol, 2 G Mag,  20G L. FA  CBG 160 100% on neb, RR 22, 154/71, HR 90

## 2024-01-12 NOTE — Transitions of Care (Post Inpatient/ED Visit) (Signed)
 01/12/2024  Name: [name] [name] MRN: [id number] DOB: [birth year]  Today's TOC FU Call Status: Today's TOC FU Call Status:: Successful TOC FU Call Completed TOC FU Call Complete Date: 01/12/24 Patient's Name and Date of Birth confirmed.  Transition Care Management Follow-up Telephone Call Date of Discharge: 01/11/24 Discharge Facility: [facility]) Type of Discharge: Inpatient Admission Primary Inpatient Discharge Diagnosis:: COPD exacerbation How have you been since you were released from the hospital?: Better Any questions or concerns?: Yes Patient Questions/Concerns:: Unable to get an earlier appointmnent with PCP Patient Questions/Concerns Addressed: Notified Provider of Patient Questions/Concerns  Items Reviewed: Did you receive and understand the discharge instructions provided?: Yes Medications obtained,verified, and reconciled?: Yes (Medications Reviewed) Any new allergies since your discharge?: No Dietary orders reviewed?: No Do you have support at home?: Yes People in Home [RPT]: alone Name of Support/Comfort Primary Source: [name] [name]  Medications Reviewed Today: Medications Reviewed Today     Reviewed by Kennieth Cathlean DEL, RN (Case Manager) on 01/12/24 at 1140  Med List Status: <None>   Medication Order Taking? Sig Documenting Provider Last Dose Status Informant  albuterol  (VENTOLIN  HFA) 108 (90 Base) MCG/ACT inhaler [id number] Yes INHALE 2 PUFFS into THE lungs EVERY 6 HOURS AS NEEDED wheezing AND SHORTNESS OF [name] [name] [name] Barnie [name], MD  Active Self, Pharmacy Records  alendronate  (FOSAMAX ) 70 MG tablet [id number] Yes Take 1 tablet (70 mg total) by mouth every 7 (seven) days. Take with a full glass of water on an empty stomach. [name] Barnie [name], MD  Active Self, Pharmacy Records           Med Note [facility], [name] [name]   Fri Jan 09, 2024  9:10 AM) Pt takes this medication on Wednesdays  aspirin  EC 81 MG tablet [id number] Yes Take 81 mg by mouth in the morning. [provider]  Active Self, Pharmacy Records  atorvastatin  (LIPITOR ) 80 MG tablet [id number] Yes Take 1 tablet (80 mg total) by mouth daily. [name] [name]  Active Self, Pharmacy Records  busPIRone  (BUSPAR ) 15 MG tablet [id number] Yes Take 2 tablets (30 mg total) by mouth in the morning AND 1 tablet (15 mg total) daily at 12 noon AND 1 tablet (15 mg total) every evening. [name] [name], [name] E, PA  Active Self, Pharmacy Records  clonazePAM  (KLONOPIN ) 0.5 MG tablet [id number]  Take 1 tablet (0.5 mg total) by mouth 3 (three) times daily as needed for anxiety.  Patient taking differently: Take 0.5 mg by mouth 2 (two) times daily.   [name] [name], [name] E, PA  Active Self, Pharmacy Records           Med Note (WHITE, TONIA S   Wed Nov 12, 2023  6:12 AM)    doxepin  (SINEQUAN ) 75 MG capsule [id number] Yes Take 1 capsule (75 mg total) by mouth at bedtime. [name] [name], [name] E, PA  Active Self, Pharmacy Records  escitalopram  (LEXAPRO ) 20 MG tablet [id number] Yes Take 1 tablet (20 mg total) by mouth daily. [name] [name], [name] E, PA  Active Self, Pharmacy Records  ezetimibe  (ZETIA ) 10 MG tablet [id number] Yes Take 1 tablet (10 mg total) by mouth daily. [name] [name], [name] [name]  Active Self, Pharmacy Records  Fluticasone -Umeclidin-Vilant (TRELEGY ELLIPTA ) 200-62.5-25 MCG/ACT AEPB [id number] Yes Inhale 1 puff into the lungs daily. [name] Dorn [name], MD  Active Self, Pharmacy Records  furosemide  (LASIX ) 40 MG tablet [id number] Yes Take 1 tablet (40 mg total) by mouth daily. [name] Barnie [name], MD  Active Self, Pharmacy Records  gabapentin  (NEURONTIN )  600 MG tablet [id number] Yes Take 1 tablet (600 mg total) by mouth 2 (two) times daily. [name] Barnie NOVAK, MD  Active Self, Pharmacy Records  ipratropium-albuterol  (DUONEB) 0.5-2.5 (3) MG/3ML [name] [name] [id number] Yes Take 3 mLs by nebulization every 8 (eight) hours. Rosan Dayton BROCKS, DO  Active Self, Pharmacy Records  levothyroxine  (SYNTHROID ) 112 MCG tablet [id number] Yes Take 1 tablet (112 mcg total) by  mouth daily. [name] Barnie NOVAK, MD  Active Self, Pharmacy Records  metFORMIN  (GLUCOPHAGE ) 500 MG tablet [id number] Yes Take 1 tablet (500 mg total) by mouth 2 (two) times daily with a meal. [name] Barnie NOVAK, MD  Active Self, Pharmacy Records  mirtazapine  (REMERON ) 45 MG tablet [id number] Yes Take 1 tablet (45 mg total) by mouth at bedtime. [name] [name], [name] E, PA  Active Self, Pharmacy Records  montelukast  (SINGULAIR ) 10 MG tablet [id number] Yes Take 1 tablet (10 mg total) by mouth at bedtime. [name] Barnie NOVAK, MD  Active Self, Pharmacy Records  nebivolol  (BYSTOLIC ) 10 MG tablet [id number] Yes Take 1 tablet (10 mg total) by mouth daily. [name] [name]  Active Self, Pharmacy Records  nitroGLYCERIN  (NITROSTAT ) 0.4 MG SL tablet [id number] Yes Place 1 tablet (0.4 mg total) under the tongue every 5 (five) minutes as needed for chest pain. [name] [name]  Active Self, Pharmacy Records           Med Note ([name] [name]   Fri Jan 09, 2024  9:17 AM) Has available at home but has not had to use it  omeprazole  (PRILOSEC) 40 MG capsule [id number] Yes Take 1 capsule (40 mg total) by mouth 2 (two) times daily before a meal. [name] Victory LITTIE DOUGLAS, MD  Active Self, Pharmacy Records  OXYGEN  [id number] Yes Inhale 2-3 L/min into the lungs continuous. [provider]  Active Self, Pharmacy Records  predniSONE  (DELTASONE ) 10 MG tablet [id number] Yes Take 4 tablets (40 mg total) by mouth daily with breakfast for 3 days, THEN 3 tablets (30 mg total) daily with breakfast for 3 days, THEN 2 tablets (20 mg total) daily with breakfast for 3 days, THEN 1 tablet (10 mg total) daily with breakfast for 3 days. [name] Dorn NOVAK, MD  Active Self, Pharmacy Records           Med Note ([name] [name]   Fri Jan 09, 2024  9:19 AM) Pt is currently taking four tablets daily for 3 days   ticagrelor  (BRILINTA ) 60 MG TABS tablet [id number] Yes Take 1 tablet (60 mg total) by mouth 2 (two) times daily. [name] [name] LABOR, MD  Active Self,  Pharmacy Records  varenicline  (CHANTIX ) 1 MG tablet [id number] Yes Take 1 tablet (1 mg total) by mouth 2 (two) times daily. [name] Dorn NOVAK, MD  Active Self, Pharmacy Records  Med List Note Steffi Nian, CPhT 12/05/23 1009): Bowne,[name] [name] (Daughter) - sometimes the patient does miss doses  [phone] (Mobile)             Home Care and Equipment/Supplies: Were Home Health Services Ordered?: Yes Name of Home Health Agency:: [facility] Has Agency set up a time to come to your home?: No EMR reviewed for Home Health Orders: Orders present/patient has not received call (refer to CM for follow-up) Any new equipment or medical supplies ordered?: NA  Functional Questionnaire: Do you need assistance with bathing/showering or dressing?: No Do you need assistance with meal preparation?: Yes (Daughter assists) Do you need assistance with eating?: No Do you have difficulty maintaining continence: No Do  you need assistance with getting out of bed/getting out of a chair/moving?: No Do you have difficulty managing or taking your medications?: Yes (Daughter assists)  Follow up appointments reviewed: PCP Follow-up appointment confirmed?: No MD Provider Line Number:[phone] Given: Yes (RN called provider for earlier appointment  from [id number]) Specialist Hospital Follow-up appointment confirmed?: No Reason Specialist Follow-Up Not Confirmed: Patient has Specialist Provider Number and will Call for Appointment (Patient has contacted specialist and they ar to call them back an appointment date) Do you need transportation to your follow-up appointment?: No Do you understand care options if your condition(s) worsen?: Yes-patient verbalized understanding  SDOH Interventions Today    Flowsheet Row Most Recent Value  SDOH Interventions   Food Insecurity Interventions Intervention Not Indicated  Housing Interventions Intervention Not Indicated  Transportation Interventions Intervention  Not Indicated, Patient Resources (Friends/Family)  Utilities Interventions Intervention Not Indicated    Goals      Remain active and independent     VBCI Transitions of Care (TOC) Care Plan     Problems:  Recent Hospitalization for treatment of COPD Equipment/DME barrier Patient ran out of oxygen  and had become hypoxic when admitted, No Hospital Follow Up Provider appointment No available follow up appointments until August, and No Specialist appointment Specialist has not opened scheduled for appointments  Goal:  Over the next 30 days, the patient will not experience hospital readmission P Interventions:   COPD Interventions: Advised patient to track and manage COPD triggers Use of home oxygen  Patient daughter will find the closest fire station and make them aware of Mother's situation in case she runs out of oxygen  and needs help. Daughter will call [employer] to get all the tanks filled    Patient Self Care Activities:  Attend all scheduled provider appointments Call pharmacy for medication refills 3-7 days in advance of running out of medications Call provider office for new concerns or questions  Notify RN Care Manager of TOC call rescheduling needs Participate in Transition of Care Program/Attend TOC scheduled calls Take medications as prescribed   develop a rescue plan  Plan:  An initial telephone outreach has been scheduled for: [id number] Next PCP appointment scheduled for: RN contacted the PCP because no appointments available until August 29 th.  They are going to call patient with an earlier appointment Telephone follow up appointment with care management team member scheduled for:  [id number] 2:00 with Barbie Bell.       The patient has been provided with contact information for the care management team and has been advised to call with any health-related questions or concerns. The patient verbalized understanding with current POC. The patient is directed to their  insurance card regarding availability of benefits coverage   Cathlean Headland BSN RN [facility] Health [facility] Health Care Management Coordinator [email or web address] Direct Dial: [phone]  Fax: [phone] Website: [email or web address]

## 2024-01-12 NOTE — ED Notes (Signed)
 Ptar called

## 2024-01-13 NOTE — Telephone Encounter (Signed)
 Received fax from [employer] with summary of benefits. Referral form for [place]  received. Rx will be triaged to [facility].. Once benefits investigation completed, pharmacy will reach out the patient to schedule shipment. If medication is unaffordable, patient will need to express financial hardship to be referred back to [facility] for patient assistance program pre-screening.   Patient ID: [id number] Pharmacy phone: [phone] [facility] Phone#: [phone]

## 2024-01-14 DIAGNOSIS — I5033 Acute on chronic diastolic (congestive) heart failure: Secondary | ICD-10-CM | POA: Diagnosis not present

## 2024-01-14 DIAGNOSIS — N189 Chronic kidney disease, unspecified: Secondary | ICD-10-CM | POA: Diagnosis not present

## 2024-01-14 DIAGNOSIS — J441 Chronic obstructive pulmonary disease with (acute) exacerbation: Secondary | ICD-10-CM | POA: Diagnosis not present

## 2024-01-14 DIAGNOSIS — I2510 Atherosclerotic heart disease of native coronary artery without angina pectoris: Secondary | ICD-10-CM | POA: Diagnosis not present

## 2024-01-14 DIAGNOSIS — E1122 Type 2 diabetes mellitus with diabetic chronic kidney disease: Secondary | ICD-10-CM | POA: Diagnosis not present

## 2024-01-14 DIAGNOSIS — I130 Hypertensive heart and chronic kidney disease with heart failure and stage 1 through stage 4 chronic kidney disease, or unspecified chronic kidney disease: Secondary | ICD-10-CM | POA: Diagnosis not present

## 2024-01-14 DIAGNOSIS — D649 Anemia, unspecified: Secondary | ICD-10-CM | POA: Diagnosis not present

## 2024-01-14 DIAGNOSIS — F32A Depression, unspecified: Secondary | ICD-10-CM | POA: Diagnosis not present

## 2024-01-14 DIAGNOSIS — F411 Generalized anxiety disorder: Secondary | ICD-10-CM | POA: Diagnosis not present

## 2024-01-15 ENCOUNTER — Other Ambulatory Visit: Payer: Self-pay | Admitting: Pulmonary Disease

## 2024-01-15 ENCOUNTER — Other Ambulatory Visit: Payer: Self-pay

## 2024-01-15 ENCOUNTER — Other Ambulatory Visit (HOSPITAL_COMMUNITY): Payer: Self-pay

## 2024-01-15 ENCOUNTER — Other Ambulatory Visit (HOSPITAL_COMMUNITY): Payer: Self-pay | Admitting: Physician Assistant

## 2024-01-15 DIAGNOSIS — F411 Generalized anxiety disorder: Secondary | ICD-10-CM

## 2024-01-15 MED ORDER — CLONAZEPAM 0.5 MG PO TABS
0.5000 mg | ORAL_TABLET | Freq: Two times a day (BID) | ORAL | 0 refills | Status: DC | PRN
  Filled 2024-01-15 (×2): qty 60, 30d supply, fill #0

## 2024-01-15 MED ORDER — IPRATROPIUM-ALBUTEROL 0.5-2.5 (3) MG/3ML IN SOLN
3.0000 mL | Freq: Three times a day (TID) | RESPIRATORY_TRACT | 1 refills | Status: AC
  Filled 2024-01-15 (×2): qty 90, 10d supply, fill #0
  Filled 2024-02-26: qty 90, 10d supply, fill #1

## 2024-01-15 NOTE — Progress Notes (Signed)
 Provider placed orders for patient's Clonazepam  prescription. Patient was last seen by this provider on 11/14/2023. Patient was supposed to be referred to a medicare accepting pharmacy. Patient to be set with a follow up appointment with this facility. PDMP was reviewed prior to medication refill.

## 2024-01-19 ENCOUNTER — Other Ambulatory Visit: Payer: Self-pay

## 2024-01-19 ENCOUNTER — Telehealth: Payer: Self-pay | Admitting: Internal Medicine

## 2024-01-19 NOTE — Telephone Encounter (Signed)
 Copied from CRM [id number]. Topic: Clinical - Home Health Verbal Orders >> Jan 19, 2024  9:18 AM Powell HERO wrote:  Caller/Agency: Doyal Gaba Callback Number: [phone], secure voicemail Service Requested: Physical Therapy Frequency: extend her home health 1w3 Any new concerns about the patient? No, just wanting to extend since the patient has been in the ER a couple of times.

## 2024-01-20 ENCOUNTER — Telehealth: Payer: Self-pay

## 2024-01-20 ENCOUNTER — Other Ambulatory Visit: Payer: Self-pay

## 2024-01-20 NOTE — Transitions of Care (Post Inpatient/ED Visit) (Signed)
 01/20/2024  Patient ID: [name] [name], female   DOB: [birth year], 68 y.o.   MRN: [id number]  2 pm Scheduled call TOC Week 2:  On TOC week 2 outreach attempt #1, daughter, Doran Munroe [facility]) answered and attempted to connect/merge call with patient but was unsuccessful.  Rescheduled for 01/21/24  at 3 pm when daughter can be present with patient for call-if okay with patient.  Bing Edison MSN, RN RN Case Sales executive Health  VBCI-Population Health Office Hours M-F [phone] Direct Dial: [phone] Main Phone [phone]  Fax: [phone] [email or web address]

## 2024-01-21 ENCOUNTER — Telehealth: Payer: Self-pay

## 2024-01-21 ENCOUNTER — Other Ambulatory Visit: Payer: Self-pay

## 2024-01-21 NOTE — Telephone Encounter (Signed)
 Call returned to [name] [name]/[facility], message left with call back requested.

## 2024-01-21 NOTE — Patient Instructions (Signed)
 Visit Information  Thank you for taking time to visit with me today. Please don't hesitate to contact me if I can be of assistance to you before our next scheduled telephone appointment.  Our next appointment is by telephone on 01/29/24 at 330 pm with patient and daughter.   Following is a copy of your care plan:   Goals Addressed             This Visit's Progress    VBCI Transitions of Care (TOC) Care Plan   On track    (TOC RN CM Reviewed/updated 01/21/24) Problems: (Reviewed/updated 01/21/24) Recent Hospitalization for treatment of COPD Equipment/DME barrier: [employer] oxygen  agency has been to house to fill tanks.  Patient ran out of oxygen  and had become hypoxic when admitted, No Hospital Follow Up Provider appointment No available follow up appointments until August, and No Specialist appointment Specialist has not opened scheduled for appointments  Goal:  Over the next 30 days, the patient will not experience hospital readmission  Interventions: COPD Interventions: (Reviewed/updated 01/21/24) Advised patient to track and manage COPD triggers:  Patient states anxiety, feeling panicky, excessive coughing seems to be triggers.  Use of home oxygen :  3L/[place] 01/21/24; SpO2 95% while on call.  Patient daughter will find the closest fire station and make them aware of Mother's situation in case she runs out of oxygen  and needs help. Daughter located fire stations close by but has not yet contacted to make aware:  Reviewed the importance of this in case of emergency and she will follow up.  Daughter will call [employer] to get all the tanks filled.  01/21/24: Completed via [employer] per Daughter Discussed/Educated on what a rescue plan is and online resources to assist along with PCP and other providers.  Reviewed Medications and no changes since last week.  Discussed smoking and COPD:  Patient stated only smoked 1 today, remarking how hard it is to stop.    Patient Self Care Activities:  (Reviewed/updated 01/21/24) Attend all scheduled provider appointments PCP now scheduled for 01/28/23 as soon as can be seen, even arranged at another practice to work it in.  Call pharmacy for medication refills 3-7 days in advance of running out of medications Call provider office for new concerns or questions  Notify RN Care Manager of [facility] call rescheduling needs Next call will be Thursday 01/29/24 at 330 pm.  Participate in Transition of Care Program/Attend [facility] scheduled calls Take all medications as prescribed and reviewed today.  Develop a rescue plan: Daughter will type this up and put a copy on refrigerator and on back of patient's bedroom door with DNR.   Plan: (Reviewed/updated 01/21/24) Telephone follow up appointment with care management team member scheduled for:   01/29/24 at 330 pm with RN CM Bing Edison at [phone].  Attend PCP appointment 01/28/24:  Daughter will transport.  Review specialist appointments confirmations;  Pulmonary: Late July/Early August. Cardiology December 2025.         Patient verbalizes understanding of instructions and care plan provided today and agrees to view in MyChart. Active MyChart status and patient understanding of how to access instructions and care plan via MyChart confirmed with patient.     Telephone follow up appointment with care management team member scheduled for: 01/29/24 at 330 pm with [facility] RN CM  Please call the care guide team at [phone] if you need to cancel or reschedule your appointment.   Please call the Suicide and Crisis Lifeline: 988 call the [place]  National Suicide  Prevention Lifeline: 815-761-1298 or TTY: (959)089-5793 TTY (781)862-3569) to talk to a trained counselor if you are experiencing a Mental Health or Behavioral Health Crisis or need someone to talk to.   Bing Edison MSN, RN RN Case Sales executive Health  VBCI-Population Health Office Hours M-F [phone] Direct Dial: [phone] Main Phone [phone]   Fax: [phone] [email or web address]

## 2024-01-21 NOTE — Transitions of Care (Post Inpatient/ED Visit) (Signed)
 Transition of Care week 2  Visit Note  Created 01/21/24 Had to re-sign encounter on 02/03/2024  Name: [name] [name] MRN: [id number]          DOB: 1955-11-11  Situation: Patient enrolled in [facility] 30-day program. Visit completed with patient and daughter by telephone.   Background:  Date of Discharge: 01/11/24 Discharge Facility: [facility]) Type of Discharge: Inpatient Admission Primary Inpatient Discharge Diagnosis:: COPD exacerbation How have you been since you were released from the hospital?: Better 01/21/24:  I am doing Okay this week. I have had no shortness of breath this week (with oxygen ).   Initial Transition Care Management Follow-up Telephone Call: Week 2. No changes in SDOH when reviewed.  Improvement in Shortness of breath on review of systems assessment.  Daughter handles benefits/insurance issues.  Updated Patient centered goals, interventions, and self care management.  DME Oxygen  issues fixed.  Medications/Allergies reviewed.  No changes in functional status, but patient states feeling better with no shortness of breath unless gets up and runs around the house. Discussed/educated or reviewed Training and development officer plan. Discussed rationale/provided education and resources to develop a COPD safety plan PCP HFU 01/28/24.    Past Medical History:  Diagnosis Date   Allergy    seasonal   Anemia    Anxiety    takes Lexapro  daily   Arthritis    back, from neck down pass my bra area (03/25/2017)   Asthma    Bartholin gland cyst 08/29/2011   Bruises easily    pt is on Effient    Chronic back pain    herniated nucleus pulposus   Chronic back pain    neck to bra area; lower back (03/25/2017)   Chronic kidney disease    recurrent UTI's this year 2022   COPD (chronic obstructive pulmonary disease) (HCC)    early stages   Coronary artery disease    Depression    takes Klonopin  daily   Diabetes mellitus without complication (HCC)    Diverticulosis    Fibroadenoma of  left breast    GERD (gastroesophageal reflux disease)    takes Nexium  daily   H/O hiatal hernia    Heart attack (HCC) 2011   Hemorrhoids    Hernia    Hyperlipidemia    takes Lipitor  daily   Hypertension    takes Losartan  daily and Labetalol  bid   Hypothyroidism    takes Synthroid  daily   Insomnia    hydroxyzine  prn   Joint pain    Pneumonia    couple times (03/25/2017)   Pre-diabetes    just found out 1 wk ago (03/25/2017)   Psoriasis    elbows,knees,back   Shortness of breath    with exertion   Slowing of urinary stream    Stress incontinence     Assessment: Patient Reported Symptoms: Cognitive Cognitive Status: Able to follow simple commands, No symptoms reported, Alert and oriented to person, place, and time, Normal speech and language skills      Neurological      HEENT HEENT Symptoms Reported: No symptoms reported      Cardiovascular Cardiovascular Symptoms Reported: No symptoms reported Does patient have uncontrolled Hypertension?: No    Respiratory Respiratory Symptoms Reported: No symptoms reported Other Respiratory Symptoms: Patient reports that she is doing well this week with no shortness of breath at rest on 3L/[place] continuous oxygen . Respiratory Management Strategies: Adequate rest, Coping strategies, Medication therapy, Oxygen  therapy, Routine screening Respiratory Self-Management Outcome: 4 (good)  Endocrine Endocrine  Symptoms Reported: No symptoms reported Endocrine Self-Management Outcome: 4 (good) Endocrine Comment: Metformin  daily, will check blood sugar at least once a week for trends.  Gastrointestinal Gastrointestinal Symptoms Reported: No symptoms reported      Genitourinary Genitourinary Symptoms Reported: No symptoms reported    Integumentary Integumentary Symptoms Reported: No symptoms reported    Musculoskeletal Musculoskelatal Symptoms Reviewed: Weakness Musculoskeletal Management Strategies: Activity, Adequate rest, Coping  strategies Musculoskeletal Comment: HH via [facility] for PT/OT has not been out to see patient since discharge. CW contacted PCP on 01/19/24 to obtain extention orders. Contact number given to daughter to check on this and arrange times with her, (HH call to PCP note from PCP office: Caller/Agency: [facility]  Callback Number: [phone], secure voicemail  Service Requested: Physical Therapy  Frequency: extend her home health 1w3  Any new concerns about the patient? No, just wanting to extend since the patient has been in the ER a couple of times.) ([facility] Callback Number: [phone])   Fall risk Follow up: Falls prevention discussed  Psychosocial Psychosocial Symptoms Reported: No symptoms reported         Vitals:   01/21/24 1555  SpO2: 95%    Medications Reviewed Today     Reviewed by Carolee Heron NOVAK, RN (Case Manager) on 01/21/24 at 1551  Med List Status: <None>   Medication Order Taking? Sig Documenting Provider Last Dose Status Informant  albuterol  (VENTOLIN  HFA) 108 (90 Base) MCG/ACT inhaler [id number] Yes INHALE 2 PUFFS into THE lungs EVERY 6 HOURS AS NEEDED wheezing AND SHORTNESS OF [name] Vicci Barnie NOVAK, MD  Active Self, Pharmacy Records  alendronate  (FOSAMAX ) 70 MG tablet [id number] Yes Take 1 tablet (70 mg total) by mouth every 7 (seven) days. Take with a full glass of water on an empty stomach. Vicci Barnie NOVAK, MD  Active Self, Pharmacy Records           Med Note [facility], ERIN T   Fri Jan 09, 2024  9:10 AM) Pt takes this medication on Wednesdays  aspirin  EC 81 MG tablet [id number] Yes Take 81 mg by mouth in the morning. [provider]  Active Self, Pharmacy Records  atorvastatin  (LIPITOR ) 80 MG tablet [id number]  Take 1 tablet (80 mg total) by mouth daily. Daneen Damien BROCKS, NP  Active Self, Pharmacy Records  busPIRone  (BUSPAR ) 15 MG tablet [id number] Yes Take 2 tablets (30 mg total) by mouth in the morning AND 1 tablet (15 mg total) daily at 12  noon AND 1 tablet (15 mg total) every evening. Nwoko, Uchenna E, PA  Active Self, Pharmacy Records  clonazePAM  (KLONOPIN ) 0.5 MG tablet [id number] Yes Take 1 tablet (0.5 mg total) by mouth 2 (two) times daily as needed. Nwoko, Uchenna E, PA  Active   doxepin  (SINEQUAN ) 75 MG capsule [id number] Yes Take 1 capsule (75 mg total) by mouth at bedtime. Nwoko, Uchenna E, PA  Active Self, Pharmacy Records  escitalopram  (LEXAPRO ) 20 MG tablet [id number] Yes Take 1 tablet (20 mg total) by mouth daily. Nwoko, Uchenna E, PA  Active Self, Pharmacy Records  ezetimibe  (ZETIA ) 10 MG tablet [id number] Yes Take 1 tablet (10 mg total) by mouth daily. Janene Boer, [place]  Active Self, Pharmacy Records  Fluticasone -Umeclidin-Vilant (TRELEGY ELLIPTA ) 200-62.5-25 MCG/ACT AEPB [id number] Yes Inhale 1 puff into the lungs daily. Kara Dorn NOVAK, MD  Active Self, Pharmacy Records  furosemide  (LASIX ) 40 MG tablet [id number] Yes Take 1 tablet (40 mg total) by mouth daily. Vicci Barnie NOVAK, MD  Active Self,  Pharmacy Records  gabapentin  (NEURONTIN ) 600 MG tablet [id number] Yes Take 1 tablet (600 mg total) by mouth 2 (two) times daily. Vicci Barnie NOVAK, MD  Active Self, Pharmacy Records  ipratropium-albuterol  (DUONEB) 0.5-2.5 (3) MG/3ML SOLN [id number]  Take 3 mLs by nebulization every 8 (eight) hours. Kara Dorn NOVAK, MD  Active   levothyroxine  (SYNTHROID ) 112 MCG tablet [id number] Yes Take 1 tablet (112 mcg total) by mouth daily. Vicci Barnie NOVAK, MD  Active Self, Pharmacy Records  metFORMIN  (GLUCOPHAGE ) 500 MG tablet [id number] Yes Take 1 tablet (500 mg total) by mouth 2 (two) times daily with a meal. Vicci Barnie NOVAK, MD  Active Self, Pharmacy Records  mirtazapine  (REMERON ) 45 MG tablet [id number] Yes Take 1 tablet (45 mg total) by mouth at bedtime. Nwoko, Uchenna E, PA  Active Self, Pharmacy Records  montelukast  (SINGULAIR ) 10 MG tablet [id number] Yes Take 1 tablet (10 mg total) by mouth at bedtime. Vicci Barnie NOVAK, MD  Active  Self, Pharmacy Records  nebivolol  (BYSTOLIC ) 10 MG tablet [id number] Yes Take 1 tablet (10 mg total) by mouth daily. Daneen Damien BROCKS, NP  Active Self, Pharmacy Records  nitroGLYCERIN  (NITROSTAT ) 0.4 MG SL tablet [id number] Yes Place 1 tablet (0.4 mg total) under the tongue every 5 (five) minutes as needed for chest pain. Daneen Damien BROCKS, NP  Active Self, Pharmacy Records           Med Note (MARROW, ROCKY DASEN   Fri Jan 09, 2024  9:17 AM) Has available at home but has not had to use it  omeprazole  (PRILOSEC) 40 MG capsule [id number] Yes Take 1 capsule (40 mg total) by mouth 2 (two) times daily before a meal. Legrand Victory LITTIE DOUGLAS, MD  Active Self, Pharmacy Records  OXYGEN  [id number] Yes Inhale 2-3 L/min into the lungs continuous. [provider]  Active Self, Pharmacy Records  ticagrelor  (BRILINTA ) 60 MG TABS tablet [id number] Yes Take 1 tablet (60 mg total) by mouth 2 (two) times daily. Burnard Debby LABOR, MD  Active Self, Pharmacy Records  varenicline  (CHANTIX ) 1 MG tablet [id number] Yes Take 1 tablet (1 mg total) by mouth 2 (two) times daily. Kara Dorn NOVAK, MD  Active Self, Pharmacy Records  Med List Note Steffi Nian, CPhT 12/05/23 1009): Bowne,Tiffiney (Daughter) - sometimes the patient does miss doses  [phone] (Mobile)             Goals Addressed             This Visit's Progress    [facility]BC[facility] Transitions of Care (TOC) Care Plan   On track    (TOC RN CM Reviewed/updated 01/21/24) Problems: (Reviewed/updated 01/21/24) Recent Hospitalization for treatment of COPD Equipment/DME barrier: [employer] oxygen  agency has been to house to fill tanks.  Patient ran out of oxygen  and had become hypoxic when admitted, No Hospital Follow Up Provider appointment No available follow up appointments until August, and No Specialist appointment Specialist has not opened scheduled for appointments  Goal:  Over the next 30 days, the patient will not experience hospital readmission  Interventions: COPD  Interventions: (Reviewed/updated 01/21/24) Advised patient to track and manage COPD triggers:  Patient states anxiety, feeling panicky, excessive coughing seems to be triggers.  Use of home oxygen :  3L/[place] 01/21/24; SpO2 95% while on call.  Patient daughter will find the closest fire station and make them aware of Mother's situation in case she runs out of oxygen  and needs help. Daughter located fire stations close by but has not yet contacted to make  aware:  Reviewed the importance of this in case of emergency and she will follow up.  Daughter will call [employer] to get all the tanks filled.  01/21/24: Completed via [employer] per Daughter Discussed/Educated on what a rescue plan is and online resources to assist along with PCP and other providers.  Reviewed Medications and no changes since last week.  Discussed smoking and COPD:  Patient stated only smoked 1 today, remarking how hard it is to stop.    Patient Self Care Activities: (Reviewed/updated 01/21/24) Attend all scheduled provider appointments PCP now scheduled for 01/28/23 as soon as can be seen, even arranged at another practice to work it in.  Call pharmacy for medication refills 3-7 days in advance of running out of medications Call provider office for new concerns or questions  Notify RN Care Manager of [facility] call rescheduling needs Next call will be Thursday 01/29/24 at 330 pm.  Participate in Transition of Care Program/Attend [facility] scheduled calls Take all medications as prescribed and reviewed today.  Develop a rescue plan: Daughter will type this up and put a copy on refrigerator and on back of patient's bedroom door with DNR.   Plan: (Reviewed/updated 01/21/24) Telephone follow up appointment with care management team member scheduled for:   01/29/24 at 330 pm with RN CM Bing Edison at [phone].  Attend PCP appointment 01/28/24:  Daughter will transport.  Review specialist appointments confirmations;  Pulmonary: Late July/Early August.  Cardiology December 2025.       01/21/24:  I am doing Okay this week. I have had no shortness of breath this week (with oxygen ).   Recommendation:   PCP Follow-up appointment on 01/28/24. Specialty provider follow-up Pending Pulmonary appointment late July/Early August 2025.  Continue Current Plan of Care along with Goals/Interventions/Self Care reviewed, educated and discussed on this call today.  Daughter to contact [facility] regarding status and to set up times with daughter to be present, has contact information.   Follow Up Plan:   Telephone follow up appointment date/time:  01/29/24 at 330 pm.     Bing Edison MSN, RN RN Case Manager [facility]  [facility] Office Hours M-F [phone] Direct Dial: [phone] Main Phone [phone]  Fax: [phone] [email or web address]    (Had to keep resigning encounter).

## 2024-01-22 ENCOUNTER — Other Ambulatory Visit (HOSPITAL_COMMUNITY): Payer: Self-pay

## 2024-01-22 ENCOUNTER — Telehealth (HOSPITAL_COMMUNITY): Payer: Self-pay | Admitting: Physician Assistant

## 2024-01-22 ENCOUNTER — Other Ambulatory Visit: Payer: Self-pay | Admitting: Internal Medicine

## 2024-01-22 ENCOUNTER — Telehealth (HOSPITAL_COMMUNITY): Payer: Self-pay | Admitting: *Deleted

## 2024-01-22 DIAGNOSIS — E039 Hypothyroidism, unspecified: Secondary | ICD-10-CM

## 2024-01-22 MED ORDER — LEVOTHYROXINE SODIUM 112 MCG PO TABS
112.0000 ug | ORAL_TABLET | Freq: Every day | ORAL | 2 refills | Status: AC
  Filled 2024-01-22: qty 90, 90d supply, fill #0
  Filled 2024-05-07: qty 90, 90d supply, fill #1

## 2024-01-22 NOTE — Telephone Encounter (Signed)
 Patients daughter called to ask about getting a refill of Klonopin  so that patient will have enough until her appointment on 7/25 at 4pm. States she was given a 2 week supply but it will run out before the appointment.

## 2024-01-22 NOTE — Telephone Encounter (Signed)
 Message acknowledged and reviewed.

## 2024-01-22 NOTE — Telephone Encounter (Signed)
 Patient's daughter called to say that patient only has a few days to a week of medication and would like a refill called in.  Daughter can be reached at [phone]

## 2024-01-22 NOTE — Telephone Encounter (Addendum)
 Pt is asking for a refill of Clonazepa. Pt was given a temporary refill on July 3rd.    Daughter is stating that she is running out. Tried to transfer PT to make a UPCOMING APP.   JNL

## 2024-01-22 NOTE — Telephone Encounter (Signed)
 Call received from [facility] and I gave her authorization to extend the PT visits.

## 2024-01-22 NOTE — Telephone Encounter (Addendum)
 [facility] returning call, call transferred to [facility].

## 2024-01-22 NOTE — Telephone Encounter (Signed)
 Message acknowledged and reviewed. Patient was given a 30 day supply of clonazepam  with the instruction for the patient to take 2 times daily as needed. The prescription was filled on 01/15/2024. Patient should have enough clonazepam  to last her until her next appointment.

## 2024-01-23 ENCOUNTER — Other Ambulatory Visit (HOSPITAL_COMMUNITY): Payer: Self-pay

## 2024-01-23 ENCOUNTER — Other Ambulatory Visit: Payer: Self-pay

## 2024-01-23 DIAGNOSIS — E1122 Type 2 diabetes mellitus with diabetic chronic kidney disease: Secondary | ICD-10-CM | POA: Diagnosis not present

## 2024-01-23 DIAGNOSIS — I5033 Acute on chronic diastolic (congestive) heart failure: Secondary | ICD-10-CM | POA: Diagnosis not present

## 2024-01-23 DIAGNOSIS — J441 Chronic obstructive pulmonary disease with (acute) exacerbation: Secondary | ICD-10-CM | POA: Diagnosis not present

## 2024-01-23 DIAGNOSIS — F32A Depression, unspecified: Secondary | ICD-10-CM | POA: Diagnosis not present

## 2024-01-23 DIAGNOSIS — I2510 Atherosclerotic heart disease of native coronary artery without angina pectoris: Secondary | ICD-10-CM | POA: Diagnosis not present

## 2024-01-23 DIAGNOSIS — D649 Anemia, unspecified: Secondary | ICD-10-CM | POA: Diagnosis not present

## 2024-01-23 DIAGNOSIS — N189 Chronic kidney disease, unspecified: Secondary | ICD-10-CM | POA: Diagnosis not present

## 2024-01-23 DIAGNOSIS — F411 Generalized anxiety disorder: Secondary | ICD-10-CM | POA: Diagnosis not present

## 2024-01-23 DIAGNOSIS — I130 Hypertensive heart and chronic kidney disease with heart failure and stage 1 through stage 4 chronic kidney disease, or unspecified chronic kidney disease: Secondary | ICD-10-CM | POA: Diagnosis not present

## 2024-01-23 NOTE — Telephone Encounter (Signed)
 Message acknowledged and reviewed.  Provider was able to reach out to patient's daughter to discuss concerns over her medication running out.  Provider informed patient's daughter that patient was prescribed clonazepam  0.5 mg 2 times daily as needed (60 tablets) and that she should not run out of her prescription before her next encounter because her prescriptions for the last 30 days from when it was refilled.  Patient's daughter informed provider that the pharmacy gave her a smaller quantity of pills than 60 tablets.  Provider instructed patient's daughter to contact patient's pharmacy to discuss concerns over the quantity of medication.

## 2024-01-24 ENCOUNTER — Other Ambulatory Visit (HOSPITAL_COMMUNITY): Payer: Self-pay

## 2024-01-26 DIAGNOSIS — F32A Depression, unspecified: Secondary | ICD-10-CM | POA: Diagnosis not present

## 2024-01-26 DIAGNOSIS — F411 Generalized anxiety disorder: Secondary | ICD-10-CM | POA: Diagnosis not present

## 2024-01-26 DIAGNOSIS — I130 Hypertensive heart and chronic kidney disease with heart failure and stage 1 through stage 4 chronic kidney disease, or unspecified chronic kidney disease: Secondary | ICD-10-CM | POA: Diagnosis not present

## 2024-01-26 DIAGNOSIS — J441 Chronic obstructive pulmonary disease with (acute) exacerbation: Secondary | ICD-10-CM | POA: Diagnosis not present

## 2024-01-26 DIAGNOSIS — D649 Anemia, unspecified: Secondary | ICD-10-CM | POA: Diagnosis not present

## 2024-01-26 DIAGNOSIS — N189 Chronic kidney disease, unspecified: Secondary | ICD-10-CM | POA: Diagnosis not present

## 2024-01-26 DIAGNOSIS — I5033 Acute on chronic diastolic (congestive) heart failure: Secondary | ICD-10-CM | POA: Diagnosis not present

## 2024-01-26 DIAGNOSIS — I2510 Atherosclerotic heart disease of native coronary artery without angina pectoris: Secondary | ICD-10-CM | POA: Diagnosis not present

## 2024-01-26 DIAGNOSIS — E1122 Type 2 diabetes mellitus with diabetic chronic kidney disease: Secondary | ICD-10-CM | POA: Diagnosis not present

## 2024-01-28 ENCOUNTER — Encounter (INDEPENDENT_AMBULATORY_CARE_PROVIDER_SITE_OTHER): Payer: Self-pay | Admitting: Primary Care

## 2024-01-28 ENCOUNTER — Ambulatory Visit (INDEPENDENT_AMBULATORY_CARE_PROVIDER_SITE_OTHER): Admitting: Primary Care

## 2024-01-28 ENCOUNTER — Telehealth: Payer: Self-pay

## 2024-01-28 VITALS — BP 125/79 | HR 63 | Resp 16 | Ht 61.0 in | Wt 158.0 lb

## 2024-01-28 DIAGNOSIS — D649 Anemia, unspecified: Secondary | ICD-10-CM | POA: Diagnosis not present

## 2024-01-28 DIAGNOSIS — I130 Hypertensive heart and chronic kidney disease with heart failure and stage 1 through stage 4 chronic kidney disease, or unspecified chronic kidney disease: Secondary | ICD-10-CM | POA: Diagnosis not present

## 2024-01-28 DIAGNOSIS — I2510 Atherosclerotic heart disease of native coronary artery without angina pectoris: Secondary | ICD-10-CM | POA: Diagnosis not present

## 2024-01-28 DIAGNOSIS — Z09 Encounter for follow-up examination after completed treatment for conditions other than malignant neoplasm: Secondary | ICD-10-CM | POA: Diagnosis not present

## 2024-01-28 DIAGNOSIS — J441 Chronic obstructive pulmonary disease with (acute) exacerbation: Secondary | ICD-10-CM | POA: Diagnosis not present

## 2024-01-28 DIAGNOSIS — F32A Depression, unspecified: Secondary | ICD-10-CM | POA: Diagnosis not present

## 2024-01-28 DIAGNOSIS — N189 Chronic kidney disease, unspecified: Secondary | ICD-10-CM | POA: Diagnosis not present

## 2024-01-28 DIAGNOSIS — E1122 Type 2 diabetes mellitus with diabetic chronic kidney disease: Secondary | ICD-10-CM | POA: Diagnosis not present

## 2024-01-28 DIAGNOSIS — I5033 Acute on chronic diastolic (congestive) heart failure: Secondary | ICD-10-CM | POA: Diagnosis not present

## 2024-01-28 DIAGNOSIS — F411 Generalized anxiety disorder: Secondary | ICD-10-CM | POA: Diagnosis not present

## 2024-01-28 NOTE — Progress Notes (Signed)
 Subjective:   [name] [name] is a 69 y.o. female presents for hospital follow up.  She is in a wheelchair on oxygen .  Admit date to the hospital was 01/08/24, patient was discharged from the hospital on 01/12/24, patient was admitted for: COPD exacerbation, shortness of breath and wears oxygen  24/7.  She has given her daughter [name] [name] to be present and provide information at her appointment.  Primary care provider is Dr. Barnie [name] she will see for follow-up.  Reviewed the patient's chart she has extensive visits to the hospital and the emergency room.  First question asked that she lives alone-yes.  Second question what are you afraid that you have to call 911 so frequently-patient answered I cannot breath and I am afraid nobody is going to be here when that happens.  [name] [name] states she tries to help her mother as much as possible, she has been on the waiting list for Meals on Wheels.  After talking with patient and daughter she is lonely.  Spoke with clinical nurse manager about the options she will given if she continues to call 911 eventually someone's been the suggest she needs to be in a facility.  Patient stated that is not what she wanted.  [name] [name] provided information about availability of paramedics visiting the home once to twice a week to do check on her.  Patient and daughter agree paperwork has been signed.  Hopefully, this intervention will decrease her emergency room visits.  Past Medical History:  Diagnosis Date   Allergy    seasonal   Anemia    Anxiety    takes Lexapro  daily   Arthritis    back, from neck down pass my bra area (03/25/2017)   Asthma    Bartholin gland cyst 08/29/2011   Bruises easily    pt is on Effient    Chronic back pain    herniated nucleus pulposus   Chronic back pain    neck to bra area; lower back (03/25/2017)   Chronic kidney disease    recurrent UTI's this year 2022   COPD (chronic obstructive pulmonary disease) (HCC)    early stages   Coronary  artery disease    Depression    takes Klonopin  daily   Diabetes mellitus without complication (HCC)    Diverticulosis    Fibroadenoma of left breast    GERD (gastroesophageal reflux disease)    takes Nexium  daily   H/O hiatal hernia    Heart attack (HCC) 2011   Hemorrhoids    Hernia    Hyperlipidemia    takes Lipitor  daily   Hypertension    takes Losartan  daily and Labetalol  bid   Hypothyroidism    takes Synthroid  daily   Insomnia    hydroxyzine  prn   Joint pain    Pneumonia    couple times (03/25/2017)   Pre-diabetes    just found out 1 wk ago (03/25/2017)   Psoriasis    elbows,knees,back   Shortness of breath    with exertion   Slowing of urinary stream    Stress incontinence      Allergies  Allergen Reactions   Avocado Anaphylaxis   Latex Shortness Of Breath and Rash   Codeine  Nausea Only    Current Outpatient Medications on File Prior to Visit  Medication Sig Dispense Refill   albuterol  (VENTOLIN  HFA) 108 (90 Base) MCG/ACT inhaler INHALE 2 PUFFS into THE lungs EVERY 6 HOURS AS NEEDED wheezing AND SHORTNESS OF BREATH 54 g 1  alendronate  (FOSAMAX ) 70 MG tablet Take 1 tablet (70 mg total) by mouth every 7 (seven) days. Take with a full glass of water on an empty stomach. 12 tablet 1   aspirin  EC 81 MG tablet Take 81 mg by mouth in the morning.     atorvastatin  (LIPITOR ) 80 MG tablet Take 1 tablet (80 mg total) by mouth daily. 90 tablet 3   busPIRone  (BUSPAR ) 15 MG tablet Take 2 tablets (30 mg total) by mouth in the morning AND 1 tablet (15 mg total) daily at 12 noon AND 1 tablet (15 mg total) every evening. 360 tablet 0   clonazePAM  (KLONOPIN ) 0.5 MG tablet Take 1 tablet (0.5 mg total) by mouth 2 (two) times daily as needed. 60 tablet 0   doxepin  (SINEQUAN ) 75 MG capsule Take 1 capsule (75 mg total) by mouth at bedtime. 90 capsule 0   escitalopram  (LEXAPRO ) 20 MG tablet Take 1 tablet (20 mg total) by mouth daily. 90 tablet 0   ezetimibe  (ZETIA ) 10 MG tablet Take  1 tablet (10 mg total) by mouth daily. 90 tablet 3   Fluticasone -Umeclidin-Vilant (TRELEGY ELLIPTA ) 200-62.5-25 MCG/ACT AEPB Inhale 1 puff into the lungs daily. 60 each 11   furosemide  (LASIX ) 40 MG tablet Take 1 tablet (40 mg total) by mouth daily. 90 tablet 1   gabapentin  (NEURONTIN ) 600 MG tablet Take 1 tablet (600 mg total) by mouth 2 (two) times daily. 180 tablet 2   ipratropium-albuterol  (DUONEB) 0.5-2.5 (3) MG/3ML SOLN Take 3 mLs by nebulization every 8 (eight) hours. 90 mL 1   levothyroxine  (SYNTHROID ) 112 MCG tablet Take 1 tablet (112 mcg total) by mouth daily. 90 tablet 2   metFORMIN  (GLUCOPHAGE ) 500 MG tablet Take 1 tablet (500 mg total) by mouth 2 (two) times daily with a meal. 180 tablet 1   mirtazapine  (REMERON ) 45 MG tablet Take 1 tablet (45 mg total) by mouth at bedtime. 90 tablet 0   montelukast  (SINGULAIR ) 10 MG tablet Take 1 tablet (10 mg total) by mouth at bedtime. 90 tablet 2   nebivolol  (BYSTOLIC ) 10 MG tablet Take 1 tablet (10 mg total) by mouth daily. 90 tablet 3   nitroGLYCERIN  (NITROSTAT ) 0.4 MG SL tablet Place 1 tablet (0.4 mg total) under the tongue every 5 (five) minutes as needed for chest pain. 25 tablet 3   omeprazole  (PRILOSEC) 40 MG capsule Take 1 capsule (40 mg total) by mouth 2 (two) times daily before a meal. 60 capsule 1   OXYGEN  Inhale 2-3 L/min into the lungs continuous.     ticagrelor  (BRILINTA ) 60 MG TABS tablet Take 1 tablet (60 mg total) by mouth 2 (two) times daily. 180 tablet 2   varenicline  (CHANTIX ) 1 MG tablet Take 1 tablet (1 mg total) by mouth 2 (two) times daily. 60 tablet 3   No current facility-administered medications on file prior to visit.    Review of System: ROS Comprehensive ROS Pertinent positive and negative noted in HPI   Objective:  BP 125/79 (BP Location: Left Arm, Patient Position: Sitting, Cuff Size: Small)   Pulse 63   Resp 16   Ht 5' 1 (1.549 m)   Wt 158 lb (71.7 kg)   SpO2 99%   BMI 29.85 kg/m   Filed Weights    01/28/24 1614  Weight: 158 lb (71.7 kg)    Physical Exam Constitutional:      Appearance: Normal appearance. She is obese.  HENT:     Right Ear: There is impacted cerumen.  Left Ear: Tympanic membrane, ear canal and external ear normal.  Eyes:     Extraocular Movements: Extraocular movements intact.  Cardiovascular:     Rate and Rhythm: Normal rate and regular rhythm.  Pulmonary:     Effort: Respiratory distress present.     Breath sounds: Wheezing present.  Abdominal:     General: Bowel sounds are normal. There is distension.     Palpations: Abdomen is soft.  Musculoskeletal:     Cervical back: Normal range of motion.     Comments: Bilateral lower extremity weakness  Skin:    General: Skin is warm and dry.  Neurological:     Mental Status: She is oriented to person, place, and time.  Psychiatric:        Mood and Affect: Mood normal.        Behavior: Behavior normal.      Assessment:  Kameela was seen today for hospitalization follow-up.  Diagnoses and all orders for this visit:  Hospital discharge follow-up See HPI   This note has been created with Education officer, environmental. Any transcriptional errors are unintentional.   No follow-ups on file.  Rosaline SHAUNNA Bohr, NP 01/28/2024, 4:39 PM

## 2024-01-28 NOTE — Telephone Encounter (Signed)
 I spoke with the patient and her daughter while they were at her appointment with Rosaline Bohr, NP today.  We discussed the community paramedicine program and how it may benefit the patient with clinical support from the paramedicine visits. Both the patient and her daughter, [name], were in agreement to being referred to the program.

## 2024-01-29 ENCOUNTER — Telehealth: Payer: Self-pay

## 2024-01-29 NOTE — Telephone Encounter (Signed)
 Engineer, maintenance referral emailed to [facility]

## 2024-02-03 ENCOUNTER — Telehealth: Payer: Self-pay

## 2024-02-03 ENCOUNTER — Other Ambulatory Visit (HOSPITAL_COMMUNITY): Payer: Self-pay | Admitting: Emergency Medicine

## 2024-02-03 NOTE — Telephone Encounter (Signed)
 Spoke with [name] [name] she is aware of PCP instructions to give extra furosemide  for the next 2 days only if there is lower extremity swelling . [name] voiced the she does not think patient needs Tessalon  Perles.

## 2024-02-03 NOTE — Telephone Encounter (Signed)
 Take an extra furosemide  for the next 2 days only if there is lower extremity swelling.  Let me know if she feels she needs some Tessalon  Perles for the cough.

## 2024-02-03 NOTE — Telephone Encounter (Signed)
 Message received from [facility], Paramedic Good morning! Seeing [name] [name] today first visit. She is complaining of a productive cough x 2 days with thick white/yellow sputum. No fever. Also, weight is up. She says she was 154lb yesterday and today I weighed her at 161.8lb. BP is 100/60. I know Dr Vicci prescribes Furosemide  40mg  daily.  Routing to PCP for advisement.

## 2024-02-03 NOTE — Progress Notes (Unsigned)
 Paramedicine Encounter    Patient ID: [name] [name], female    DOB: February 10, 1956, 68 y.o.   MRN: [id number]   Complaints Productive cough over the past two days.  Assessment A&O x 4, skin W&D w/ good color.  Lung sounds clear in the upper lobes and  ronchi in bases more and diminished  in the rt lower lobe.  Compliance with meds Missed Monday morning dose  Pill box filled filled by daughter  Refills needed handled by daughter  Meds changes since last visit NONE    Social changes NONE   BP 100/60 (BP Location: Left Arm, Patient Position: Sitting, Cuff Size: Normal)   Pulse 63   Resp 18   Wt 161 lb (73 kg)   SpO2 96%   BMI 30.42 kg/m  Weight yesterday-174lb Last visit weight-  ACTION: Home visit completed  [name] [phone] 02/04/24  Patient Care Team: Vicci Barnie [name], MD as PCP - General (Internal Medicine) Burnard Debby LABOR, MD (Inactive) as PCP - Cardiology (Cardiology) Shaaron Lamar HERO, MD as Consulting Physician (Gastroenterology) Onesimo Emaline Brink, MD as Consulting Physician (Hematology) Collene Reginia BRAVO, [place] as Physician Assistant (Physician Assistant) Carolee Heron [name], RN as Case Manager  Patient Active Problem List   Diagnosis Date Noted   Hypokalemia 01/08/2024   Acute on chronic heart failure with preserved ejection fraction (HFpEF, >= 50%) (HCC) 12/05/2023   At risk for polypharmacy 12/04/2023   Long-term current use of antidepressant 11/17/2023   Long-term current use of benzodiazepine 11/17/2023   Sigmoid diverticulitis 11/12/2023   Acute kidney injury superimposed on chronic kidney disease (HCC) 11/12/2023   CKD (chronic kidney disease) stage 2, GFR 60-89 ml/min 11/12/2023   Sepsis secondary to sigmoid diverticulitis (HCC) 11/12/2023   Acute on chronic hypoxic respiratory failure (HCC) 10/05/2023   Pneumothorax, right 09/16/2023   COPD with acute exacerbation (HCC) 09/01/2023   Chronic hypoxic respiratory failure (HCC)  09/01/2023   Continuous dependence on cigarette smoking 09/01/2023   History of CAD (coronary artery disease) 09/01/2023   CAP (community acquired pneumonia) 09/01/2023   Full code status 06/14/2023   Acute hypoxic respiratory failure (HCC) 06/13/2023   Acute exacerbation of chronic obstructive pulmonary disease (COPD) (HCC) 06/12/2023   Nicotine  dependence 05/03/2023   Substance abuse (HCC) 09/10/2022   Iron  deficiency anemia 11/02/2021   Major depressive disorder, recurrent, in full remission with anxious distress (HCC) 05/31/2021   Chronic neck pain 02/26/2021   GAD (generalized anxiety disorder) 01/08/2021   Esophageal dysphagia 04/28/2019   Bipolar disorder (HCC) 09/17/2018   Insomnia 10/28/2017   Prediabetes 05/28/2017   QT prolongation 03/25/2017   Psoriasis 06/22/2015   COPD (chronic obstructive pulmonary disease) (HCC)GOLD E 05/18/2015   Anxiety and depression 07/10/2013   Essential hypertension 06/07/2013   Hyperlipidemia 06/07/2013   Asthma, chronic 06/07/2013   Emphysema lung (HCC) 06/07/2013   Chronic back pain 06/07/2013   CAD S/P percutaneous coronary angioplasty 06/07/2013   GERD (gastroesophageal reflux disease) 06/07/2013   Breast lump on left side at 3 o'clock position 10/13/2012   S/P abdominal hysterectomy and right salpingo-oophorectomy 08/29/2011   COPD exacerbation (HCC) 09/15/2009   TOBACCO ABUSE 07/17/2009   Chronic rhinitis 07/17/2009   Lung nodule < 6cm on CT 07/17/2009   Hypothyroidism 12/22/2008   Chronic depression 12/22/2008    Current Outpatient Medications:    albuterol  (VENTOLIN  HFA) 108 (90 Base) MCG/ACT inhaler, INHALE 2 PUFFS into THE lungs EVERY 6 HOURS AS NEEDED wheezing AND SHORTNESS OF BREATH, Disp:  54 g, Rfl: 1   alendronate  (FOSAMAX ) 70 MG tablet, Take 1 tablet (70 mg total) by mouth every 7 (seven) days. Take with a full glass of water on an empty stomach., Disp: 12 tablet, Rfl: 1   aspirin  EC 81 MG tablet, Take 81 mg by mouth in  the morning., Disp: , Rfl:    atorvastatin  (LIPITOR ) 80 MG tablet, Take 1 tablet (80 mg total) by mouth daily., Disp: 90 tablet, Rfl: 3   busPIRone  (BUSPAR ) 15 MG tablet, Take 2 tablets (30 mg total) by mouth in the morning AND 1 tablet (15 mg total) daily at 12 noon AND 1 tablet (15 mg total) every evening., Disp: 360 tablet, Rfl: 0   clonazePAM  (KLONOPIN ) 0.5 MG tablet, Take 1 tablet (0.5 mg total) by mouth 2 (two) times daily as needed., Disp: 60 tablet, Rfl: 0   doxepin  (SINEQUAN ) 75 MG capsule, Take 1 capsule (75 mg total) by mouth at bedtime., Disp: 90 capsule, Rfl: 0   escitalopram  (LEXAPRO ) 20 MG tablet, Take 1 tablet (20 mg total) by mouth daily., Disp: 90 tablet, Rfl: 0   ezetimibe  (ZETIA ) 10 MG tablet, Take 1 tablet (10 mg total) by mouth daily., Disp: 90 tablet, Rfl: 3   Fluticasone -Umeclidin-Vilant (TRELEGY ELLIPTA ) 200-62.5-25 MCG/ACT AEPB, Inhale 1 puff into the lungs daily., Disp: 60 each, Rfl: 11   furosemide  (LASIX ) 40 MG tablet, Take 1 tablet (40 mg total) by mouth daily., Disp: 90 tablet, Rfl: 1   gabapentin  (NEURONTIN ) 600 MG tablet, Take 1 tablet (600 mg total) by mouth 2 (two) times daily., Disp: 180 tablet, Rfl: 2   ipratropium-albuterol  (DUONEB) 0.5-2.5 (3) MG/3ML SOLN, Take 3 mLs by nebulization every 8 (eight) hours., Disp: 90 mL, Rfl: 1   levothyroxine  (SYNTHROID ) 112 MCG tablet, Take 1 tablet (112 mcg total) by mouth daily., Disp: 90 tablet, Rfl: 2   metFORMIN  (GLUCOPHAGE ) 500 MG tablet, Take 1 tablet (500 mg total) by mouth 2 (two) times daily with a meal., Disp: 180 tablet, Rfl: 1   mirtazapine  (REMERON ) 45 MG tablet, Take 1 tablet (45 mg total) by mouth at bedtime., Disp: 90 tablet, Rfl: 0   montelukast  (SINGULAIR ) 10 MG tablet, Take 1 tablet (10 mg total) by mouth at bedtime., Disp: 90 tablet, Rfl: 2   nebivolol  (BYSTOLIC ) 10 MG tablet, Take 1 tablet (10 mg total) by mouth daily., Disp: 90 tablet, Rfl: 3   nitroGLYCERIN  (NITROSTAT ) 0.4 MG SL tablet, Place 1 tablet  (0.4 mg total) under the tongue every 5 (five) minutes as needed for chest pain., Disp: 25 tablet, Rfl: 3   omeprazole  (PRILOSEC) 40 MG capsule, Take 1 capsule (40 mg total) by mouth 2 (two) times daily before a meal., Disp: 60 capsule, Rfl: 1   OXYGEN , Inhale 2-3 L/min into the lungs continuous., Disp: , Rfl:    ticagrelor  (BRILINTA ) 60 MG TABS tablet, Take 1 tablet (60 mg total) by mouth 2 (two) times daily., Disp: 180 tablet, Rfl: 2   varenicline  (CHANTIX ) 1 MG tablet, Take 1 tablet (1 mg total) by mouth 2 (two) times daily., Disp: 60 tablet, Rfl: 3 Allergies  Allergen Reactions   Avocado Anaphylaxis   Latex Shortness Of Breath and Rash   Codeine  Nausea Only     Social History   Socioeconomic History   Marital status: Divorced    Spouse name: Not on file   Number of children: 1   Years of education: Not on file   Highest education level: 9th grade  Occupational  History   Not on file  Tobacco Use   Smoking status: Every Day    Current packs/day: 0.50    Average packs/day: 0.5 packs/day for 50.0 years (25.0 ttl pk-yrs)    Types: Cigarettes   Smokeless tobacco: Never  Vaping Use   Vaping status: Some Days   Substances: Nicotine   Substance and Sexual Activity   Alcohol use: Yes    Comment: once a week   Drug use: Yes    Types: Marijuana    Comment: history of cocaine use, been about a year, per patient (12/18/21)   Sexual activity: Not Currently    Birth control/protection: Surgical  Other Topics Concern   Not on file  Social History Narrative   Not on file   Social Drivers of Health   Financial Resource Strain: Medium Risk (08/07/2023)   Overall Financial Resource Strain (CARDIA)    Difficulty of Paying Living Expenses: Somewhat hard  Food Insecurity: No Food Insecurity (01/12/2024)   Hunger Vital Sign    Worried About Running Out of Food in the Last Year: Never true    Ran Out of Food in the Last Year: Never true  Transportation Needs: No Transportation Needs  (01/12/2024)   PRAPARE - Administrator, Civil Service (Medical): No    Lack of Transportation (Non-Medical): No  Physical Activity: Inactive (08/07/2023)   Exercise Vital Sign    Days of Exercise per Week: 0 days    Minutes of Exercise per Session: 0 min  Stress: No Stress Concern Present (08/07/2023)   [employer] of Occupational Health - Occupational Stress Questionnaire    Feeling of Stress : Only a little  Social Connections: Moderately Integrated (01/09/2024)   Social Connection and Isolation Panel    Frequency of Communication with Friends and Family: More than three times a week    Frequency of Social Gatherings with Friends and Family: More than three times a week    Attends Religious Services: 1 to 4 times per year    Active Member of Clubs or Organizations: No    Attends Banker Meetings: 1 to 4 times per year    Marital Status: Never married  Recent Concern: Social Connections - Moderately Isolated (12/04/2023)   Social Connection and Isolation Panel    Frequency of Communication with Friends and Family: Twice a week    Frequency of Social Gatherings with Friends and Family: Never    Attends Religious Services: 1 to 4 times per year    Active Member of Clubs or Organizations: No    Attends Banker Meetings: 1 to 4 times per year    Marital Status: Never married  Intimate Partner Violence: Not At Risk (01/12/2024)   Humiliation, Afraid, Rape, and Kick questionnaire    Fear of Current or Ex-Partner: No    Emotionally Abused: No    Physically Abused: No    Sexually Abused: No    Physical Exam      Future Appointments  Date Time Provider Department Center  02/06/2024  4:00 PM Collene Reginia BRAVO, [place] [facility] None  03/12/2024  3:50 PM Vicci Barnie NOVAK, MD [facility] None  03/23/2024 12:30 PM [facility] None  03/23/2024  1:00 PM Onesimo Emaline Brink, MD [facility] None  04/13/2024  4:00 PM Kara Dorn NOVAK, MD  [facility] None  06/22/2024  4:10 PM [facility] ANNUAL WELLNESS VISIT [facility] None  07/13/2024  4:00 PM Floretta Mallard, MD [facility]

## 2024-02-04 DIAGNOSIS — N189 Chronic kidney disease, unspecified: Secondary | ICD-10-CM | POA: Diagnosis not present

## 2024-02-04 DIAGNOSIS — F411 Generalized anxiety disorder: Secondary | ICD-10-CM | POA: Diagnosis not present

## 2024-02-04 DIAGNOSIS — I130 Hypertensive heart and chronic kidney disease with heart failure and stage 1 through stage 4 chronic kidney disease, or unspecified chronic kidney disease: Secondary | ICD-10-CM | POA: Diagnosis not present

## 2024-02-04 DIAGNOSIS — F32A Depression, unspecified: Secondary | ICD-10-CM | POA: Diagnosis not present

## 2024-02-04 DIAGNOSIS — I5033 Acute on chronic diastolic (congestive) heart failure: Secondary | ICD-10-CM | POA: Diagnosis not present

## 2024-02-04 DIAGNOSIS — J441 Chronic obstructive pulmonary disease with (acute) exacerbation: Secondary | ICD-10-CM | POA: Diagnosis not present

## 2024-02-04 DIAGNOSIS — D649 Anemia, unspecified: Secondary | ICD-10-CM | POA: Diagnosis not present

## 2024-02-04 DIAGNOSIS — E1122 Type 2 diabetes mellitus with diabetic chronic kidney disease: Secondary | ICD-10-CM | POA: Diagnosis not present

## 2024-02-04 DIAGNOSIS — I2510 Atherosclerotic heart disease of native coronary artery without angina pectoris: Secondary | ICD-10-CM | POA: Diagnosis not present

## 2024-02-05 DIAGNOSIS — E1122 Type 2 diabetes mellitus with diabetic chronic kidney disease: Secondary | ICD-10-CM | POA: Diagnosis not present

## 2024-02-05 DIAGNOSIS — F411 Generalized anxiety disorder: Secondary | ICD-10-CM | POA: Diagnosis not present

## 2024-02-05 DIAGNOSIS — N189 Chronic kidney disease, unspecified: Secondary | ICD-10-CM | POA: Diagnosis not present

## 2024-02-05 DIAGNOSIS — D649 Anemia, unspecified: Secondary | ICD-10-CM | POA: Diagnosis not present

## 2024-02-05 DIAGNOSIS — I130 Hypertensive heart and chronic kidney disease with heart failure and stage 1 through stage 4 chronic kidney disease, or unspecified chronic kidney disease: Secondary | ICD-10-CM | POA: Diagnosis not present

## 2024-02-05 DIAGNOSIS — F32A Depression, unspecified: Secondary | ICD-10-CM | POA: Diagnosis not present

## 2024-02-05 DIAGNOSIS — I5033 Acute on chronic diastolic (congestive) heart failure: Secondary | ICD-10-CM | POA: Diagnosis not present

## 2024-02-05 DIAGNOSIS — I2510 Atherosclerotic heart disease of native coronary artery without angina pectoris: Secondary | ICD-10-CM | POA: Diagnosis not present

## 2024-02-05 DIAGNOSIS — J441 Chronic obstructive pulmonary disease with (acute) exacerbation: Secondary | ICD-10-CM | POA: Diagnosis not present

## 2024-02-06 ENCOUNTER — Other Ambulatory Visit: Payer: Self-pay

## 2024-02-06 ENCOUNTER — Other Ambulatory Visit (HOSPITAL_COMMUNITY): Payer: Self-pay

## 2024-02-06 ENCOUNTER — Telehealth (HOSPITAL_COMMUNITY): Admitting: Physician Assistant

## 2024-02-06 DIAGNOSIS — G4700 Insomnia, unspecified: Secondary | ICD-10-CM | POA: Diagnosis not present

## 2024-02-06 DIAGNOSIS — F411 Generalized anxiety disorder: Secondary | ICD-10-CM

## 2024-02-06 DIAGNOSIS — F3342 Major depressive disorder, recurrent, in full remission: Secondary | ICD-10-CM | POA: Diagnosis not present

## 2024-02-06 DIAGNOSIS — Z79899 Other long term (current) drug therapy: Secondary | ICD-10-CM

## 2024-02-06 MED ORDER — HYDROXYZINE HCL 25 MG PO TABS
25.0000 mg | ORAL_TABLET | Freq: Every evening | ORAL | 2 refills | Status: DC | PRN
  Filled 2024-02-06: qty 30, 30d supply, fill #0
  Filled 2024-04-12 – 2024-04-13 (×2): qty 30, 30d supply, fill #1

## 2024-02-06 MED ORDER — MIRTAZAPINE 45 MG PO TABS
45.0000 mg | ORAL_TABLET | Freq: Every day | ORAL | 0 refills | Status: DC
  Filled 2024-02-06 – 2024-02-27 (×2): qty 90, 90d supply, fill #0

## 2024-02-06 MED ORDER — BUSPIRONE HCL 15 MG PO TABS
ORAL_TABLET | ORAL | 0 refills | Status: DC
  Filled 2024-02-06 – 2024-02-27 (×2): qty 360, 90d supply, fill #0

## 2024-02-06 MED ORDER — DOXEPIN HCL 75 MG PO CAPS
75.0000 mg | ORAL_CAPSULE | Freq: Every day | ORAL | 0 refills | Status: DC
  Filled 2024-02-06 – 2024-02-27 (×2): qty 90, 90d supply, fill #0

## 2024-02-06 MED ORDER — ESCITALOPRAM OXALATE 20 MG PO TABS
20.0000 mg | ORAL_TABLET | Freq: Every day | ORAL | 0 refills | Status: DC
  Filled 2024-02-06 – 2024-02-27 (×2): qty 90, 90d supply, fill #0

## 2024-02-06 MED ORDER — CLONAZEPAM 0.5 MG PO TABS
0.5000 mg | ORAL_TABLET | Freq: Two times a day (BID) | ORAL | 0 refills | Status: DC | PRN
  Filled 2024-02-06 – 2024-02-12 (×7): qty 60, 30d supply, fill #0

## 2024-02-06 NOTE — Progress Notes (Signed)
 [facility] MD/PA/NP OP Progress Note  Virtual Visit via Video Note  I connected with [name] [name] on 02/06/24 at  4:00 PM EDT by a video enabled telemedicine application and verified that I am speaking with the correct person using two identifiers.  Location: Patient: Home Provider: Clinic   I discussed the limitations of evaluation and management by telemedicine and the availability of in person appointments. The patient expressed understanding and agreed to proceed.  Follow Up Instructions:   I discussed the assessment and treatment plan with the patient. The patient was provided an opportunity to ask questions and all were answered. The patient agreed with the plan and demonstrated an understanding of the instructions.   The patient was advised to call back or seek an in-person evaluation if the symptoms worsen or if the condition fails to improve as anticipated.  I provided 21 minutes of non-face-to-face time during this encounter.  Reginia FORBES Bolster, PA   02/06/2024 4:00 PM [name] [name]  MRN:  [id number]  Chief Complaint:  Chief Complaint  Patient presents with   Follow-up   Medication Management   HPI:   [name] [name] is a 68 year old female with a past psychiatric history significant for insomnia, generalized anxiety disorder, and major depressive disorder who presents to [facility] via virtual video visit for follow-up and medication management.  Patient is currently being managed on the following medications:  Doxepin  (Sinequan ) 75 mg at bedtime Buspirone  15 mg 2 times daily in the morning/1 tablet at noon/1 tablet in the evening  Escitalopram  20 mg daily Clonazepam  0.5 mg 2 times daily as needed Mirtazapine  45 mg at bedtime  Patient presents to the encounter reporting no issues or concerns regarding her current medication regimen and denies experiencing any adverse side effects.  Patient reports that her sleep has been an issue as  of late.  She describes her sleep as broken and states that she often wakes up at 2 to 3 AM in the morning.  Patient denies being on any other sleep aids in the past besides melatonin and trazodone.  Patient denies overt depressive symptoms but does endorse some anxiety attributed to living by herself.  Patient reports that she still continues to take her clonazepam  as needed.  Patient reports that her clonazepam  goes into effect 30 minutes after taking the medication.  Patient reports that she normally takes her clonazepam  at bedtime but states that the medication does not last long enough due to waking up throughout the night.  Patient denies any adverse side effects associated with using clonazepam .  A PHQ-9 screen was performed with the patient scoring an 8.  A GAD-7 screen was also performed with the patient scoring a 9.  Patient is alert and oriented x 4, pleasant, calm, cooperative, and fully engaged in conversation during the encounter.  Patient endorses tired mood.  Patient exhibits euthymic mood with appropriate affect.  Patient denies suicidal or homicidal ideations.  She further denies auditory or visual hallucinations and does not appear to be responding to internal/external stimuli.  Patient endorses poor sleep and received an average 3 to 4 hours of intermittent sleep.  Patient reports that she will occasionally sleep during the daytime.  Patient endorses fair appetite and eats on average 1 good meal per day as well as snacking throughout the day.  Patient denies alcohol consumption or illicit drug use.  Patient endorses tobacco use and smokes on average 5 cigarettes/day.  Visit Diagnosis:  ICD-10-CM   1. Insomnia, unspecified type  G47.00 hydrOXYzine  (ATARAX ) 25 MG tablet    2. GAD (generalized anxiety disorder)  F41.1 clonazePAM  (KLONOPIN ) 0.5 MG tablet    busPIRone  (BUSPAR ) 15 MG tablet    mirtazapine  (REMERON ) 45 MG tablet    escitalopram  (LEXAPRO ) 20 MG tablet    3. Major  depressive disorder, recurrent, in full remission with anxious distress (HCC)  F33.42 busPIRone  (BUSPAR ) 15 MG tablet    doxepin  (SINEQUAN ) 75 MG capsule    mirtazapine  (REMERON ) 45 MG tablet    escitalopram  (LEXAPRO ) 20 MG tablet    4. Long-term current use of benzodiazepine  Z79.899       Past Psychiatric History:  Insomnia Major depressive disorder Generalized anxiety disorder  Past Medical History:  Past Medical History:  Diagnosis Date   Allergy    seasonal   Anemia    Anxiety    takes Lexapro  daily   Arthritis    back, from neck down pass my bra area (03/25/2017)   Asthma    Bartholin gland cyst 08/29/2011   Bruises easily    pt is on Effient    Chronic back pain    herniated nucleus pulposus   Chronic back pain    neck to bra area; lower back (03/25/2017)   Chronic kidney disease    recurrent UTI's this year 2022   COPD (chronic obstructive pulmonary disease) (HCC)    early stages   Coronary artery disease    Depression    takes Klonopin  daily   Diabetes mellitus without complication (HCC)    Diverticulosis    Fibroadenoma of left breast    GERD (gastroesophageal reflux disease)    takes Nexium  daily   H/O hiatal hernia    Heart attack (HCC) 2011   Hemorrhoids    Hernia    Hyperlipidemia    takes Lipitor  daily   Hypertension    takes Losartan  daily and Labetalol  bid   Hypothyroidism    takes Synthroid  daily   Insomnia    hydroxyzine  prn   Joint pain    Pneumonia    couple times (03/25/2017)   Pre-diabetes    just found out 1 wk ago (03/25/2017)   Psoriasis    elbows,knees,back   Shortness of breath    with exertion   Slowing of urinary stream    Stress incontinence     Past Surgical History:  Procedure Laterality Date   ABDOMINAL EXPLORATION SURGERY  1977   ABDOMINAL HYSTERECTOMY  1977   left one of my ovaries   BACK SURGERY     BIOPSY  05/27/2019   Procedure: BIOPSY;  Surgeon: Shaaron Lamar HERO, MD;  [facility]: [facility];   Service: Endoscopy;;   BREAST BIOPSY Left 11/2021   x2   COLONOSCOPY  2011   Dr. Teressa: Mild diverticulosis, descending diminutive colon polyp (not retrieved), next colonoscopy 10 years   CORONARY ANGIOPLASTY WITH STENT PLACEMENT  2011 X2   regular stents didn't work; had to go back in in ~ 1 month and put in medicated stents   DILATION AND CURETTAGE OF UTERUS     ESOPHAGOGASTRODUODENOSCOPY     ESOPHAGOGASTRODUODENOSCOPY (EGD) WITH PROPOFOL  N/A 05/27/2019   normal esophagus, dilation, erosive gastropathy s/p biopsy, normal duodenum. Negative H.pylori.    LEFT HEART CATH AND CORONARY ANGIOGRAPHY N/A 03/26/2017   Procedure: LEFT HEART CATH AND CORONARY ANGIOGRAPHY;  Surgeon: Claudene Victory ORN, MD;  Location: [facility] INVASIVE CV LAB;  Service: Cardiovascular;  Laterality: N/A;  LUMBAR LAMINECTOMY/DECOMPRESSION MICRODISCECTOMY  08/05/2011   Procedure: LUMBAR LAMINECTOMY/DECOMPRESSION MICRODISCECTOMY;  Surgeon: Lynwood JONELLE Mill, MD;  Location: [facility] NEURO ORS;  Service: Neurosurgery;  Laterality: Right;  Right Lumbar four-five extraforaminal discectomy   MALONEY DILATION N/A 05/27/2019   Procedure: AGAPITO DILATION;  Surgeon: Shaaron Lamar HERO, MD;  Location: [facility];  Service: Endoscopy;  Laterality: N/A;  54   SHOULDER ARTHROSCOPY WITH ROTATOR CUFF REPAIR AND OPEN BICEPS TENODESIS Right 12/24/2021   Procedure: RIGHT SHOULDER ARTHROSCOPY WITH ROTATOR CUFF REPAIR AND BICEPS TENODESIS;  Surgeon: Barbarann Oneil BROCKS, MD;  Location: [facility] OR;  Service: Orthopedics;  Laterality: Right;   TONSILLECTOMY     as a child   UPPER GASTROINTESTINAL ENDOSCOPY      Family Psychiatric History:  See intake H&P for full details. Reviewed, with no updates at this time.  Family History:  Family History  Problem Relation Age of Onset   Heart disease Mother    Hypertension Mother    Stroke Mother    Mental illness Mother    Heart disease Father    Hypertension Father    Diabetes Father    Colon polyps Brother    Heart  disease Maternal Grandfather    Cancer Paternal Grandmother        [place]   Anesthesia problems Daughter    Breast cancer Maternal Aunt    Throat cancer Maternal Uncle    Thyroid  disease Paternal Aunt    Hypotension Neg Hx    Malignant hyperthermia Neg Hx    Pseudochol deficiency Neg Hx    Colon cancer Neg Hx    Stomach cancer Neg Hx    Esophageal cancer Neg Hx    Pancreatic cancer Neg Hx    Rectal cancer Neg Hx     Social History:  Social History   Socioeconomic History   Marital status: Divorced    Spouse name: Not on file   Number of children: 1   Years of education: Not on file   Highest education level: 9th grade  Occupational History   Not on file  Tobacco Use   Smoking status: Every Day    Current packs/day: 0.50    Average packs/day: 0.5 packs/day for 50.0 years (25.0 ttl pk-yrs)    Types: Cigarettes   Smokeless tobacco: Never  Vaping Use   Vaping status: Some Days   Substances: Nicotine   Substance and Sexual Activity   Alcohol use: Yes    Comment: once a week   Drug use: Yes    Types: Marijuana    Comment: history of cocaine use, been about a year, per patient (12/18/21)   Sexual activity: Not Currently    Birth control/protection: Surgical  Other Topics Concern   Not on file  Social History Narrative   Not on file   Social Drivers of Health   Financial Resource Strain: Medium Risk (08/07/2023)   Overall Financial Resource Strain (CARDIA)    Difficulty of Paying Living Expenses: Somewhat hard  Food Insecurity: No Food Insecurity (01/12/2024)   Hunger Vital Sign    Worried About Running Out of Food in the Last Year: Never true    Ran Out of Food in the Last Year: Never true  Transportation Needs: No Transportation Needs (01/12/2024)   PRAPARE - Administrator, Civil Service (Medical): No    Lack of Transportation (Non-Medical): No  Physical Activity: Inactive (08/07/2023)   Exercise Vital Sign    Days of Exercise per Week: 0 days  Minutes of Exercise per Session: 0 min  Stress: No Stress Concern Present (08/07/2023)   [employer] of Occupational Health - Occupational Stress Questionnaire    Feeling of Stress : Only a little  Social Connections: Moderately Integrated (01/09/2024)   Social Connection and Isolation Panel    Frequency of Communication with Friends and Family: More than three times a week    Frequency of Social Gatherings with Friends and Family: More than three times a week    Attends Religious Services: 1 to 4 times per year    Active Member of [employer] or Organizations: No    Attends Engineer, structural: 1 to 4 times per year    Marital Status: Never married  Recent Concern: Social Connections - Moderately Isolated (12/04/2023)   Social Connection and Isolation Panel    Frequency of Communication with Friends and Family: Twice a week    Frequency of Social Gatherings with Friends and Family: Never    Attends Religious Services: 1 to 4 times per year    Active Member of [employer] or Organizations: No    Attends Engineer, structural: 1 to 4 times per year    Marital Status: Never married    Allergies:  Allergies  Allergen Reactions   Avocado Anaphylaxis   Latex Shortness Of Breath and Rash   Codeine  Nausea Only    Metabolic Disorder Labs: Lab Results  Component Value Date   HGBA1C 6.4 (H) 12/11/2023   MPG 131.24 09/01/2023   MPG 105.41 12/18/2021   No results found for: PROLACTIN Lab Results  Component Value Date   CHOL 145 12/26/2023   TRIG 100 12/26/2023   HDL 79 12/26/2023   CHOLHDL 1.8 12/26/2023   VLDL 15 04/16/2016   LDLCALC 48 12/26/2023   LDLCALC 93 06/26/2023   Lab Results  Component Value Date   TSH 3.520 12/11/2023   TSH 11.100 (H) 10/24/2023    Therapeutic Level Labs: No results found for: LITHIUM No results found for: VALPROATE No results found for: CBMZ  Current Medications: Current Outpatient Medications  Medication Sig Dispense  Refill   hydrOXYzine  (ATARAX ) 25 MG tablet Take 1 tablet (25 mg total) by mouth at bedtime as needed. 30 tablet 2   albuterol  (VENTOLIN  HFA) 108 (90 Base) MCG/ACT inhaler INHALE 2 PUFFS into THE lungs EVERY 6 HOURS AS NEEDED wheezing AND SHORTNESS OF BREATH 54 g 1   alendronate  (FOSAMAX ) 70 MG tablet Take 1 tablet (70 mg total) by mouth every 7 (seven) days. Take with a full glass of water on an empty stomach. 12 tablet 1   aspirin  EC 81 MG tablet Take 81 mg by mouth in the morning.     atorvastatin  (LIPITOR ) 80 MG tablet Take 1 tablet (80 mg total) by mouth daily. 90 tablet 3   busPIRone  (BUSPAR ) 15 MG tablet Take 2 tablets (30 mg total) by mouth in the morning AND 1 tablet (15 mg total) daily at 12 noon AND 1 tablet (15 mg total) every evening. 360 tablet 0   [START ON 02/12/2024] clonazePAM  (KLONOPIN ) 0.5 MG tablet Take 1 tablet (0.5 mg total) by mouth 2 (two) times daily as needed. 60 tablet 0   doxepin  (SINEQUAN ) 75 MG capsule Take 1 capsule (75 mg total) by mouth at bedtime. 90 capsule 0   escitalopram  (LEXAPRO ) 20 MG tablet Take 1 tablet (20 mg total) by mouth daily. 90 tablet 0   ezetimibe  (ZETIA ) 10 MG tablet Take 1 tablet (10 mg total)  by mouth daily. 90 tablet 3   Fluticasone -Umeclidin-Vilant (TRELEGY ELLIPTA ) 200-62.5-25 MCG/ACT AEPB Inhale 1 puff into the lungs daily. 60 each 11   furosemide  (LASIX ) 40 MG tablet Take 1 tablet (40 mg total) by mouth daily. 90 tablet 1   gabapentin  (NEURONTIN ) 600 MG tablet Take 1 tablet (600 mg total) by mouth 2 (two) times daily. 180 tablet 2   ipratropium-albuterol  (DUONEB) 0.5-2.5 (3) MG/3ML SOLN Take 3 mLs by nebulization every 8 (eight) hours. 90 mL 1   levothyroxine  (SYNTHROID ) 112 MCG tablet Take 1 tablet (112 mcg total) by mouth daily. 90 tablet 2   metFORMIN  (GLUCOPHAGE ) 500 MG tablet Take 1 tablet (500 mg total) by mouth 2 (two) times daily with a meal. 180 tablet 1   mirtazapine  (REMERON ) 45 MG tablet Take 1 tablet (45 mg total) by mouth at  bedtime. 90 tablet 0   montelukast  (SINGULAIR ) 10 MG tablet Take 1 tablet (10 mg total) by mouth at bedtime. 90 tablet 2   nebivolol  (BYSTOLIC ) 10 MG tablet Take 1 tablet (10 mg total) by mouth daily. 90 tablet 3   nitroGLYCERIN  (NITROSTAT ) 0.4 MG SL tablet Place 1 tablet (0.4 mg total) under the tongue every 5 (five) minutes as needed for chest pain. 25 tablet 3   omeprazole  (PRILOSEC) 40 MG capsule Take 1 capsule (40 mg total) by mouth 2 (two) times daily before a meal. 60 capsule 1   OXYGEN  Inhale 2-3 L/min into the lungs continuous.     ticagrelor  (BRILINTA ) 60 MG TABS tablet Take 1 tablet (60 mg total) by mouth 2 (two) times daily. 180 tablet 2   varenicline  (CHANTIX ) 1 MG tablet Take 1 tablet (1 mg total) by mouth 2 (two) times daily. 60 tablet 3   No current facility-administered medications for this visit.     Musculoskeletal: Strength & Muscle Tone: Unable to assess due to telemedicine visit Gait & Station: Unable to assess due to telemedicine visit Patient leans: Unable to assess due to telemedicine visit  Psychiatric Specialty Exam: Review of Systems  Psychiatric/Behavioral:  Negative for dysphoric mood, hallucinations, self-injury, sleep disturbance and suicidal ideas. The patient is nervous/anxious. The patient is not hyperactive.     There were no vitals taken for this visit.There is no height or weight on file to calculate BMI.  General Appearance: Casual  Eye Contact:  Good  Speech:  Clear and Coherent and Normal Rate  Volume:  Normal  Mood:  Anxious  Affect:  Appropriate  Thought Process:  Coherent, Goal Directed, and Descriptions of Associations: Intact  Orientation:  Full (Time, Place, and Person)  Thought Content: WDL   Suicidal Thoughts:  No  Homicidal Thoughts:  No  Memory:  Immediate;   Good Recent;   Good Remote;   Good  Judgement:  Fair  Insight:  Fair  Psychomotor Activity:  Normal  Concentration:  Concentration: Good and Attention Span: Good   Recall:  [employer] of Knowledge: Fair  Language: Fair  Akathisia:  Negative  Handed:  Right  AIMS (if indicated): not done  Assets:  Communication Skills Desire for Improvement Housing  ADL's:  Intact  Cognition: WNL  Sleep:  Fair   Screenings: AUDIT    Flowsheet Row [facility]  Alcohol Use Disorder Identification Test Final Score (AUDIT) 12   CAGE-AID    Flowsheet Row ED to Hosp-Admission (Discharged) from 09/16/2023 in [facility] 4E CV SURGICAL PROGRESSIVE CARE  CAGE-AID Score 0  GAD-7    Flowsheet Row Video Visit from 02/06/2024 in [facility] Telephone Visit  from 01/21/2024 in [facility] POPULATION HEALTH DEPARTMENT Office Visit from 12/11/2023 in [facility] Health Comm [facility] [place] - A Dept Of [place]. [facility] Video Visit from 11/14/2023 in [facility] Office Visit from 11/11/2023 in [facility] Comm Health [place] - A Dept Of [place]. [facility]  Total GAD-7 Score 9 2 3 5 5    PHQ2-9    Flowsheet Row Video Visit from 02/06/2024 in [facility] Office Visit from 01/28/2024 in [facility] Family Medicine Telephone from 01/12/2024 in [facility] POPULATION HEALTH DEPARTMENT Office Visit from 12/11/2023 in [facility] Health Comm [facility] [place] - A Dept Of [place]. [facility] Video Visit from 11/14/2023 in [facility]  PHQ-2 Total Score 2 1 0 0 0  PHQ-9 Total Score 8 -- -- 5 --   Flowsheet Row Video Visit from 02/06/2024 in [facility] ED from 01/12/2024 in [facility] Emergency Department at [facility] ED to Hosp-Admission (Discharged) from 01/08/2024 in [facility]TH  SURGICAL  C-SSRS RISK CATEGORY No Risk No Risk No Risk     Assessment and Plan:   [name] [name] is a 68 year old female with a past  psychiatric history significant for insomnia, generalized anxiety disorder, and major depressive disorder who presents to [facility] via virtual video visit for follow-up and medication management.  Patient presents to the encounter stating that she continues to take her medications regularly and denies experiencing any adverse side effects.  Reports that she continues to take her clonazepam  as prescribed and denies experiencing any adverse side effects associated with the medication.  Patient reports that she has been having issues with her sleep lately.  Patient states that she often finds herself waking up between 2 and 3 AM in the morning.  Patient states that she occasionally uses her clonazepam  to help her sleep but states that the medication does not help her sleep through the night.  Patient states that she has been on trazodone and melatonin in the past for the management of her sleep.  Provider recommended patient be placed on hydroxyzine  25 mg at bedtime as needed for the management of her sleep.  Patient was agreeable to recommendation.  Patient denies overt depressive symptoms but still continues to endorse some anxiety attributed to living by herself.  A PHQ-9 screen was performed with the patient scoring an 8.  A GAD-7 screen was also performed with the patient scoring a 9.  Patient endorses stability on her current medication regimen and would like to continue taking her other medications as prescribed.  Patient's medications to be e-prescribed to pharmacy of choice.  A [place] Suicide Severity Rating Scale was performed with the patient being considered no risk.  Patient denies suicidal ideations and is able to contract for safety following the completion of the encounter.  Collaboration of Care: Collaboration of Care: Medication Management AEB patient's medications being managed by this provider and Psychiatrist AEB patient being managed by this  behavioral health provider  Patient/Guardian was advised Release of Information must be obtained prior to any record release in order to collaborate their care with an outside provider. Patient/Guardian was advised if they have not already done so to contact the registration department to sign all necessary forms in order for  us  to release information regarding their care.   Consent: Patient/Guardian gives verbal consent for treatment and assignment of benefits for services provided during this visit. Patient/Guardian expressed understanding and agreed to proceed.   1. GAD (generalized anxiety disorder)  - clonazePAM  (KLONOPIN ) 0.5 MG tablet; Take 1 tablet (0.5 mg total) by mouth 2 (two) times daily as needed.  Dispense: 60 tablet; Refill: 0 - busPIRone  (BUSPAR ) 15 MG tablet; Take 2 tablets (30 mg total) by mouth in the morning AND 1 tablet (15 mg total) daily at 12 noon AND 1 tablet (15 mg total) every evening.  Dispense: 360 tablet; Refill: 0 - mirtazapine  (REMERON ) 45 MG tablet; Take 1 tablet (45 mg total) by mouth at bedtime.  Dispense: 90 tablet; Refill: 0 - escitalopram  (LEXAPRO ) 20 MG tablet; Take 1 tablet (20 mg total) by mouth daily.  Dispense: 90 tablet; Refill: 0  2. Major depressive disorder, recurrent, in full remission with anxious distress (HCC)  - busPIRone  (BUSPAR ) 15 MG tablet; Take 2 tablets (30 mg total) by mouth in the morning AND 1 tablet (15 mg total) daily at 12 noon AND 1 tablet (15 mg total) every evening.  Dispense: 360 tablet; Refill: 0 - doxepin  (SINEQUAN ) 75 MG capsule; Take 1 capsule (75 mg total) by mouth at bedtime.  Dispense: 90 capsule; Refill: 0 - mirtazapine  (REMERON ) 45 MG tablet; Take 1 tablet (45 mg total) by mouth at bedtime.  Dispense: 90 tablet; Refill: 0 - escitalopram  (LEXAPRO ) 20 MG tablet; Take 1 tablet (20 mg total) by mouth daily.  Dispense: 90 tablet; Refill: 0  3. Insomnia, unspecified type (Primary)  - hydrOXYzine  (ATARAX ) 25 MG tablet; Take 1  tablet (25 mg total) by mouth at bedtime as needed.  Dispense: 30 tablet; Refill: 2  4. Long-term current use of benzodiazepine Urine drug screen pending  Patient to follow up in 2 months Provider spent a total of 21 minutes with the patient/reviewing patient's chart  Reginia FORBES Bolster, PA 02/06/2024 4:00 PM

## 2024-02-08 ENCOUNTER — Encounter (HOSPITAL_COMMUNITY): Payer: Self-pay | Admitting: Physician Assistant

## 2024-02-09 ENCOUNTER — Other Ambulatory Visit (HOSPITAL_COMMUNITY): Payer: Self-pay

## 2024-02-09 ENCOUNTER — Telehealth: Payer: Self-pay

## 2024-02-09 NOTE — Telephone Encounter (Signed)
 Copied from CRM [id number]. Topic: Clinical - Home Health Verbal Orders >> Feb 09, 2024  2:37 PM Delon HERO wrote: [name] is calling to request verbal orders for PT. Frequency- 1 W 9 [phone] Verbal ok on VM

## 2024-02-10 ENCOUNTER — Other Ambulatory Visit: Payer: Self-pay

## 2024-02-10 ENCOUNTER — Other Ambulatory Visit (HOSPITAL_COMMUNITY): Payer: Self-pay | Admitting: Emergency Medicine

## 2024-02-10 NOTE — Transitions of Care (Post Inpatient/ED Visit) (Signed)
 02/10/2024  Patient ID: [name] [name], female   DOB: 1956/06/07, 68 y.o.   MRN: [id number]  02/10/24 Patient disenrolled after a brief call to daughter after some unsuccessful outreaches for ht 30 day program. Daughter stated they were enrolled with paramedicine program now and no further outreach calls were needed at this time and declined need to go through a completed TOC call.  Patient had also completed all targets of 30 day program with no re-admission so disenrolled as target/goals met.   Bing Edison MSN, RN RN Case Sales executive Health  [facility] Office Hours M-F [phone] Direct Dial: [phone] Main Phone [phone]  Fax: [phone] [email or web address]  Keeps notifying to sign encounter, have signed multiple times.   Bing Edison MSN, RN RN Case Sales executive Health  [facility]Population [facility] Office Hours M-F [phone] Direct Dial: [phone] Main Phone [phone]  Fax: [phone] [email or web address]

## 2024-02-10 NOTE — Progress Notes (Signed)
 Paramedicine Encounter    Patient ID: [name] [name], female    DOB: [birth year], 68 y.o.   MRN: [id number]   Complaints NONE  Assessment A&O x 4, skin W&D w/ good color.  Denies chest pain or SOB.  Wearing her O2 @ 3lpm via cannula.  Hasn't been up long enough this morning to take meds.  Lung sounds w/ ronchi and expiratory wheezes in the upper lobes bilat.  Diminished in the lower rt lobe and clear in the lower left lobe  Compliance with meds YES  Pill box filled - daughter fills pill box  Refills needed NONE  Meds changes since last visit NONE    Social changes NONE   BP 138/70 (BP Location: Left Arm, Patient Position: Sitting, Cuff Size: Normal)   Pulse 70   Resp 18   Wt 159 lb 12.8 oz (72.5 kg)   SpO2 94% Comment: w/ 3 lpm O2 via cannula  BMI 30.19 kg/m  Weight yesterday- not taken Last visit weight-161lb  ACTION: Home visit completed  [name] [phone] 02/10/24  Patient Care Team: Vicci Barnie [name], MD as PCP - General (Internal Medicine) Burnard Debby LABOR, MD (Inactive) as PCP - Cardiology (Cardiology) Shaaron Lamar HERO, MD as Consulting Physician (Gastroenterology) Onesimo Emaline Brink, MD as Consulting Physician (Hematology) Collene Reginia BRAVO, [place] as Physician Assistant (Physician Assistant) Carolee Heron [name], RN as Case Manager  Patient Active Problem List   Diagnosis Date Noted  . Hypokalemia 01/08/2024  . Acute on chronic heart failure with preserved ejection fraction (HFpEF, >= 50%) (HCC) 12/05/2023  . At risk for polypharmacy 12/04/2023  . Long-term current use of antidepressant 11/17/2023  . Long-term current use of benzodiazepine 11/17/2023  . Sigmoid diverticulitis 11/12/2023  . Acute kidney injury superimposed on chronic kidney disease (HCC) 11/12/2023  . CKD (chronic kidney disease) stage 2, GFR 60-89 ml/min 11/12/2023  . Sepsis secondary to sigmoid diverticulitis (HCC) 11/12/2023  . Acute on chronic hypoxic respiratory failure  (HCC) 10/05/2023  . Pneumothorax, right 09/16/2023  . COPD with acute exacerbation (HCC) 09/01/2023  . Chronic hypoxic respiratory failure (HCC) 09/01/2023  . Continuous dependence on cigarette smoking 09/01/2023  . History of CAD (coronary artery disease) 09/01/2023  . CAP (community acquired pneumonia) 09/01/2023  . Full code status 06/14/2023  . Acute hypoxic respiratory failure (HCC) 06/13/2023  . Acute exacerbation of chronic obstructive pulmonary disease (COPD) (HCC) 06/12/2023  . Nicotine  dependence 05/03/2023  . Substance abuse (HCC) 09/10/2022  . Iron  deficiency anemia 11/02/2021  . Major depressive disorder, recurrent, in full remission with anxious distress (HCC) 05/31/2021  . Chronic neck pain 02/26/2021  . GAD (generalized anxiety disorder) 01/08/2021  . Esophageal dysphagia 04/28/2019  . Bipolar disorder (HCC) 09/17/2018  . Insomnia 10/28/2017  . Prediabetes 05/28/2017  . QT prolongation 03/25/2017  . Psoriasis 06/22/2015  . COPD (chronic obstructive pulmonary disease) (HCC)GOLD E 05/18/2015  . Anxiety and depression 07/10/2013  . Essential hypertension 06/07/2013  . Hyperlipidemia 06/07/2013  . Asthma, chronic 06/07/2013  . Emphysema lung (HCC) 06/07/2013  . Chronic back pain 06/07/2013  . CAD S/P percutaneous coronary angioplasty 06/07/2013  . GERD (gastroesophageal reflux disease) 06/07/2013  . Breast lump on left side at 3 o'clock position 10/13/2012  . S/P abdominal hysterectomy and right salpingo-oophorectomy 08/29/2011  . COPD exacerbation (HCC) 09/15/2009  . TOBACCO ABUSE 07/17/2009  . Chronic rhinitis 07/17/2009  . Lung nodule < 6cm on CT 07/17/2009  . Hypothyroidism 12/22/2008  . Chronic depression 12/22/2008  Current Outpatient Medications:  .  albuterol  (VENTOLIN  HFA) 108 (90 Base) MCG/ACT inhaler, INHALE 2 PUFFS into THE lungs EVERY 6 HOURS AS NEEDED wheezing AND SHORTNESS OF BREATH, Disp: 54 g, Rfl: 1 .  alendronate  (FOSAMAX ) 70 MG tablet, Take  1 tablet (70 mg total) by mouth every 7 (seven) days. Take with a full glass of water on an empty stomach., Disp: 12 tablet, Rfl: 1 .  aspirin  EC 81 MG tablet, Take 81 mg by mouth in the morning., Disp: , Rfl:  .  atorvastatin  (LIPITOR ) 80 MG tablet, Take 1 tablet (80 mg total) by mouth daily., Disp: 90 tablet, Rfl: 3 .  busPIRone  (BUSPAR ) 15 MG tablet, Take 2 tablets (30 mg total) by mouth in the morning AND 1 tablet (15 mg total) daily at 12 noon AND 1 tablet (15 mg total) every evening., Disp: 360 tablet, Rfl: 0 .  [START ON 02/12/2024] clonazePAM  (KLONOPIN ) 0.5 MG tablet, Take 1 tablet (0.5 mg total) by mouth 2 (two) times daily as needed., Disp: 60 tablet, Rfl: 0 .  doxepin  (SINEQUAN ) 75 MG capsule, Take 1 capsule (75 mg total) by mouth at bedtime., Disp: 90 capsule, Rfl: 0 .  escitalopram  (LEXAPRO ) 20 MG tablet, Take 1 tablet (20 mg total) by mouth daily., Disp: 90 tablet, Rfl: 0 .  ezetimibe  (ZETIA ) 10 MG tablet, Take 1 tablet (10 mg total) by mouth daily., Disp: 90 tablet, Rfl: 3 .  Fluticasone -Umeclidin-Vilant (TRELEGY ELLIPTA ) 200-62.5-25 MCG/ACT AEPB, Inhale 1 puff into the lungs daily., Disp: 60 each, Rfl: 11 .  furosemide  (LASIX ) 40 MG tablet, Take 1 tablet (40 mg total) by mouth daily., Disp: 90 tablet, Rfl: 1 .  gabapentin  (NEURONTIN ) 600 MG tablet, Take 1 tablet (600 mg total) by mouth 2 (two) times daily., Disp: 180 tablet, Rfl: 2 .  hydrOXYzine  (ATARAX ) 25 MG tablet, Take 1 tablet (25 mg total) by mouth at bedtime as needed., Disp: 30 tablet, Rfl: 2 .  ipratropium-albuterol  (DUONEB) 0.5-2.5 (3) MG/3ML SOLN, Take 3 mLs by nebulization every 8 (eight) hours., Disp: 90 mL, Rfl: 1 .  levothyroxine  (SYNTHROID ) 112 MCG tablet, Take 1 tablet (112 mcg total) by mouth daily., Disp: 90 tablet, Rfl: 2 .  metFORMIN  (GLUCOPHAGE ) 500 MG tablet, Take 1 tablet (500 mg total) by mouth 2 (two) times daily with a meal., Disp: 180 tablet, Rfl: 1 .  mirtazapine  (REMERON ) 45 MG tablet, Take 1 tablet (45 mg  total) by mouth at bedtime., Disp: 90 tablet, Rfl: 0 .  montelukast  (SINGULAIR ) 10 MG tablet, Take 1 tablet (10 mg total) by mouth at bedtime., Disp: 90 tablet, Rfl: 2 .  nebivolol  (BYSTOLIC ) 10 MG tablet, Take 1 tablet (10 mg total) by mouth daily., Disp: 90 tablet, Rfl: 3 .  nitroGLYCERIN  (NITROSTAT ) 0.4 MG SL tablet, Place 1 tablet (0.4 mg total) under the tongue every 5 (five) minutes as needed for chest pain., Disp: 25 tablet, Rfl: 3 .  omeprazole  (PRILOSEC) 40 MG capsule, Take 1 capsule (40 mg total) by mouth 2 (two) times daily before a meal., Disp: 60 capsule, Rfl: 1 .  OXYGEN , Inhale 2-3 L/min into the lungs continuous., Disp: , Rfl:  .  ticagrelor  (BRILINTA ) 60 MG TABS tablet, Take 1 tablet (60 mg total) by mouth 2 (two) times daily., Disp: 180 tablet, Rfl: 2 .  varenicline  (CHANTIX ) 1 MG tablet, Take 1 tablet (1 mg total) by mouth 2 (two) times daily., Disp: 60 tablet, Rfl: 3 Allergies  Allergen Reactions  . Avocado Anaphylaxis  .  Latex Shortness Of Breath and Rash  . Codeine  Nausea Only     Social History   Socioeconomic History  . Marital status: Divorced    Spouse name: Not on file  . Number of children: 1  . Years of education: Not on file  . Highest education level: 9th grade  Occupational History  . Not on file  Tobacco Use  . Smoking status: Every Day    Current packs/day: 0.50    Average packs/day: 0.5 packs/day for 50.0 years (25.0 ttl pk-yrs)    Types: Cigarettes  . Smokeless tobacco: Never  Vaping Use  . Vaping status: Some Days  . Substances: Nicotine   Substance and Sexual Activity  . Alcohol use: Yes    Comment: once a week  . Drug use: Yes    Types: Marijuana    Comment: history of cocaine use, been about a year, per patient (12/18/21)  . Sexual activity: Not Currently    Birth control/protection: Surgical  Other Topics Concern  . Not on file  Social History Narrative  . Not on file   Social Drivers of Health   Financial Resource Strain: Medium  Risk (08/07/2023)   Overall Financial Resource Strain (CARDIA)   . Difficulty of Paying Living Expenses: Somewhat hard  Food Insecurity: No Food Insecurity (01/12/2024)   Hunger Vital Sign   . Worried About Programme researcher, broadcasting/film/video in the Last Year: Never true   . Ran Out of Food in the Last Year: Never true  Transportation Needs: No Transportation Needs (01/12/2024)   PRAPARE - Transportation   . Lack of Transportation (Medical): No   . Lack of Transportation (Non-Medical): No  Physical Activity: Inactive (08/07/2023)   Exercise Vital Sign   . Days of Exercise per Week: 0 days   . Minutes of Exercise per Session: 0 min  Stress: No Stress Concern Present (08/07/2023)   [employer] of Occupational Health - Occupational Stress Questionnaire   . Feeling of Stress : Only a little  Social Connections: Moderately Integrated (01/09/2024)   Social Connection and Isolation Panel   . Frequency of Communication with Friends and Family: More than three times a week   . Frequency of Social Gatherings with Friends and Family: More than three times a week   . Attends Religious Services: 1 to 4 times per year   . Active Member of Clubs or Organizations: No   . Attends Banker Meetings: 1 to 4 times per year   . Marital Status: Never married  Recent Concern: Social Connections - Moderately Isolated (12/04/2023)   Social Connection and Isolation Panel   . Frequency of Communication with Friends and Family: Twice a week   . Frequency of Social Gatherings with Friends and Family: Never   . Attends Religious Services: 1 to 4 times per year   . Active Member of Clubs or Organizations: No   . Attends Banker Meetings: 1 to 4 times per year   . Marital Status: Never married  Intimate Partner Violence: Not At Risk (01/12/2024)   Humiliation, Afraid, Rape, and Kick questionnaire   . Fear of Current or Ex-Partner: No   . Emotionally Abused: No   . Physically Abused: No   . Sexually  Abused: No    Physical Exam      Future Appointments  Date Time Provider Department Center  03/12/2024  3:50 PM Vicci Barnie NOVAK, MD [facility] None  03/23/2024 12:30 PM [facility] None  03/23/2024  1:00 PM Onesimo Emaline Brink, MD [facility] None  04/09/2024  4:00 PM Collene Reginia BRAVO, PA [facility] None  04/13/2024  4:00 PM Kara Dorn NOVAK, MD [facility] None  06/22/2024  4:10 PM [facility] ANNUAL WELLNESS VISIT [facility] None  07/13/2024  4:00 PM Floretta Mallard, MD [facility]

## 2024-02-11 ENCOUNTER — Other Ambulatory Visit: Payer: Self-pay

## 2024-02-11 ENCOUNTER — Other Ambulatory Visit (HOSPITAL_COMMUNITY): Payer: Self-pay

## 2024-02-11 ENCOUNTER — Other Ambulatory Visit: Payer: Self-pay | Admitting: Pulmonary Disease

## 2024-02-11 DIAGNOSIS — J4489 Other specified chronic obstructive pulmonary disease: Secondary | ICD-10-CM

## 2024-02-11 NOTE — Telephone Encounter (Signed)
 Gracie with center well Recertification orders given for PT 1 W 8.

## 2024-02-11 NOTE — Telephone Encounter (Signed)
>>   Feb 11, 2024  9:30 AM [name] wrote:  Gracie (PT) from [facility] is calling to get an update on this verbal/home health order. [name] is requesting a callback at [phone]

## 2024-02-12 ENCOUNTER — Other Ambulatory Visit: Payer: Self-pay

## 2024-02-12 ENCOUNTER — Other Ambulatory Visit (HOSPITAL_COMMUNITY): Payer: Self-pay

## 2024-02-12 DIAGNOSIS — F32A Depression, unspecified: Secondary | ICD-10-CM | POA: Diagnosis not present

## 2024-02-12 DIAGNOSIS — I2510 Atherosclerotic heart disease of native coronary artery without angina pectoris: Secondary | ICD-10-CM | POA: Diagnosis not present

## 2024-02-12 DIAGNOSIS — I5033 Acute on chronic diastolic (congestive) heart failure: Secondary | ICD-10-CM | POA: Diagnosis not present

## 2024-02-12 DIAGNOSIS — F411 Generalized anxiety disorder: Secondary | ICD-10-CM | POA: Diagnosis not present

## 2024-02-12 DIAGNOSIS — E1122 Type 2 diabetes mellitus with diabetic chronic kidney disease: Secondary | ICD-10-CM | POA: Diagnosis not present

## 2024-02-12 DIAGNOSIS — J441 Chronic obstructive pulmonary disease with (acute) exacerbation: Secondary | ICD-10-CM | POA: Diagnosis not present

## 2024-02-12 DIAGNOSIS — D649 Anemia, unspecified: Secondary | ICD-10-CM | POA: Diagnosis not present

## 2024-02-12 DIAGNOSIS — N189 Chronic kidney disease, unspecified: Secondary | ICD-10-CM | POA: Diagnosis not present

## 2024-02-12 DIAGNOSIS — I130 Hypertensive heart and chronic kidney disease with heart failure and stage 1 through stage 4 chronic kidney disease, or unspecified chronic kidney disease: Secondary | ICD-10-CM | POA: Diagnosis not present

## 2024-02-14 ENCOUNTER — Other Ambulatory Visit (HOSPITAL_COMMUNITY): Payer: Self-pay

## 2024-02-17 ENCOUNTER — Other Ambulatory Visit: Payer: Self-pay

## 2024-02-17 ENCOUNTER — Other Ambulatory Visit (HOSPITAL_COMMUNITY): Payer: Self-pay | Admitting: Emergency Medicine

## 2024-02-17 NOTE — Progress Notes (Unsigned)
 Paramedicine Encounter    Patient ID: [name] [name], female    DOB: Sep 27, 1955, 68 y.o.   MRN: [id number]   Complaints***  Assessment***  Compliance with meds***  Pill box filled***  Refills needed***  Meds changes since last visit***    Social changes***   BP (!) 140/78 (BP Location: Left Arm, Patient Position: Sitting, Cuff Size: Normal)   Pulse 68   Resp 18   Wt 163 lb (73.9 kg)   SpO2 96%   BMI 30.80 kg/m  Weight yesterday- Last visit weight-159lb  ACTION: {Paramed Action:[phone]}  Mary Sharps, EMT-Paramedic [phone] 02/17/24  Patient Care Team: Vicci Barnie [name], MD as PCP - General (Internal Medicine) Burnard Debby LABOR, MD (Inactive) as PCP - Cardiology (Cardiology) Shaaron Lamar HERO, MD as Consulting Physician (Gastroenterology) Onesimo Emaline Brink, MD as Consulting Physician (Hematology) Collene Reginia BRAVO, [place] as Physician Assistant (Physician Assistant) Carolee Heron [name], RN as Case Manager  Patient Active Problem List   Diagnosis Date Noted  . Hypokalemia 01/08/2024  . Acute on chronic heart failure with preserved ejection fraction (HFpEF, >= 50%) (HCC) 12/05/2023  . At risk for polypharmacy 12/04/2023  . Long-term current use of antidepressant 11/17/2023  . Long-term current use of benzodiazepine 11/17/2023  . Sigmoid diverticulitis 11/12/2023  . Acute kidney injury superimposed on chronic kidney disease (HCC) 11/12/2023  . CKD (chronic kidney disease) stage 2, GFR 60-89 ml/min 11/12/2023  . Sepsis secondary to sigmoid diverticulitis (HCC) 11/12/2023  . Acute on chronic hypoxic respiratory failure (HCC) 10/05/2023  . Pneumothorax, right 09/16/2023  . COPD with acute exacerbation (HCC) 09/01/2023  . Chronic hypoxic respiratory failure (HCC) 09/01/2023  . Continuous dependence on cigarette smoking 09/01/2023  . History of CAD (coronary artery disease) 09/01/2023  . CAP (community acquired pneumonia) 09/01/2023  . Full code status 06/14/2023  .  Acute hypoxic respiratory failure (HCC) 06/13/2023  . Acute exacerbation of chronic obstructive pulmonary disease (COPD) (HCC) 06/12/2023  . Nicotine  dependence 05/03/2023  . Substance abuse (HCC) 09/10/2022  . Iron  deficiency anemia 11/02/2021  . Major depressive disorder, recurrent, in full remission with anxious distress (HCC) 05/31/2021  . Chronic neck pain 02/26/2021  . GAD (generalized anxiety disorder) 01/08/2021  . Esophageal dysphagia 04/28/2019  . Bipolar disorder (HCC) 09/17/2018  . Insomnia 10/28/2017  . Prediabetes 05/28/2017  . QT prolongation 03/25/2017  . Psoriasis 06/22/2015  . COPD (chronic obstructive pulmonary disease) (HCC)GOLD E 05/18/2015  . Anxiety and depression 07/10/2013  . Essential hypertension 06/07/2013  . Hyperlipidemia 06/07/2013  . Asthma, chronic 06/07/2013  . Emphysema lung (HCC) 06/07/2013  . Chronic back pain 06/07/2013  . CAD S/P percutaneous coronary angioplasty 06/07/2013  . GERD (gastroesophageal reflux disease) 06/07/2013  . Breast lump on left side at 3 o'clock position 10/13/2012  . S/P abdominal hysterectomy and right salpingo-oophorectomy 08/29/2011  . COPD exacerbation (HCC) 09/15/2009  . TOBACCO ABUSE 07/17/2009  . Chronic rhinitis 07/17/2009  . Lung nodule < 6cm on CT 07/17/2009  . Hypothyroidism 12/22/2008  . Chronic depression 12/22/2008    Current Outpatient Medications:  .  albuterol  (VENTOLIN  HFA) 108 (90 Base) MCG/ACT inhaler, INHALE 2 PUFFS into THE lungs EVERY 6 HOURS AS NEEDED wheezing AND SHORTNESS OF BREATH, Disp: 54 g, Rfl: 1 .  alendronate  (FOSAMAX ) 70 MG tablet, Take 1 tablet (70 mg total) by mouth every 7 (seven) days. Take with a full glass of water on an empty stomach., Disp: 12 tablet, Rfl: 1 .  aspirin  EC 81 MG tablet, Take 81 mg  by mouth in the morning., Disp: , Rfl:  .  atorvastatin  (LIPITOR ) 80 MG tablet, Take 1 tablet (80 mg total) by mouth daily., Disp: 90 tablet, Rfl: 3 .  busPIRone  (BUSPAR ) 15 MG tablet,  Take 2 tablets (30 mg total) by mouth in the morning AND 1 tablet (15 mg total) daily at 12 noon AND 1 tablet (15 mg total) every evening., Disp: 360 tablet, Rfl: 0 .  clonazePAM  (KLONOPIN ) 0.5 MG tablet, Take 1 tablet (0.5 mg total) by mouth 2 (two) times daily as needed., Disp: 60 tablet, Rfl: 0 .  doxepin  (SINEQUAN ) 75 MG capsule, Take 1 capsule (75 mg total) by mouth at bedtime., Disp: 90 capsule, Rfl: 0 .  escitalopram  (LEXAPRO ) 20 MG tablet, Take 1 tablet (20 mg total) by mouth daily., Disp: 90 tablet, Rfl: 0 .  ezetimibe  (ZETIA ) 10 MG tablet, Take 1 tablet (10 mg total) by mouth daily., Disp: 90 tablet, Rfl: 3 .  Fluticasone -Umeclidin-Vilant (TRELEGY ELLIPTA ) 200-62.5-25 MCG/ACT AEPB, Inhale 1 puff into the lungs daily., Disp: 60 each, Rfl: 11 .  furosemide  (LASIX ) 40 MG tablet, Take 1 tablet (40 mg total) by mouth daily., Disp: 90 tablet, Rfl: 1 .  gabapentin  (NEURONTIN ) 600 MG tablet, Take 1 tablet (600 mg total) by mouth 2 (two) times daily., Disp: 180 tablet, Rfl: 2 .  hydrOXYzine  (ATARAX ) 25 MG tablet, Take 1 tablet (25 mg total) by mouth at bedtime as needed., Disp: 30 tablet, Rfl: 2 .  ipratropium-albuterol  (DUONEB) 0.5-2.5 (3) MG/3ML SOLN, Take 3 mLs by nebulization every 8 (eight) hours., Disp: 90 mL, Rfl: 1 .  levothyroxine  (SYNTHROID ) 112 MCG tablet, Take 1 tablet (112 mcg total) by mouth daily., Disp: 90 tablet, Rfl: 2 .  metFORMIN  (GLUCOPHAGE ) 500 MG tablet, Take 1 tablet (500 mg total) by mouth 2 (two) times daily with a meal., Disp: 180 tablet, Rfl: 1 .  mirtazapine  (REMERON ) 45 MG tablet, Take 1 tablet (45 mg total) by mouth at bedtime., Disp: 90 tablet, Rfl: 0 .  montelukast  (SINGULAIR ) 10 MG tablet, Take 1 tablet (10 mg total) by mouth at bedtime., Disp: 90 tablet, Rfl: 2 .  nebivolol  (BYSTOLIC ) 10 MG tablet, Take 1 tablet (10 mg total) by mouth daily., Disp: 90 tablet, Rfl: 3 .  nitroGLYCERIN  (NITROSTAT ) 0.4 MG SL tablet, Place 1 tablet (0.4 mg total) under the tongue every 5  (five) minutes as needed for chest pain., Disp: 25 tablet, Rfl: 3 .  omeprazole  (PRILOSEC) 40 MG capsule, Take 1 capsule (40 mg total) by mouth 2 (two) times daily before a meal., Disp: 60 capsule, Rfl: 1 .  OXYGEN , Inhale 2-3 L/min into the lungs continuous., Disp: , Rfl:  .  ticagrelor  (BRILINTA ) 60 MG TABS tablet, Take 1 tablet (60 mg total) by mouth 2 (two) times daily., Disp: 180 tablet, Rfl: 2 .  varenicline  (CHANTIX ) 1 MG tablet, Take 1 tablet (1 mg total) by mouth 2 (two) times daily., Disp: 60 tablet, Rfl: 3 Allergies  Allergen Reactions  . Avocado Anaphylaxis  . Latex Shortness Of Breath and Rash  . Codeine  Nausea Only     Social History   Socioeconomic History  . Marital status: Divorced    Spouse name: Not on file  . Number of children: 1  . Years of education: Not on file  . Highest education level: 9th grade  Occupational History  . Not on file  Tobacco Use  . Smoking status: Every Day    Current packs/day: 0.50    Average  packs/day: 0.5 packs/day for 50.0 years (25.0 ttl pk-yrs)    Types: Cigarettes  . Smokeless tobacco: Never  Vaping Use  . Vaping status: Some Days  . Substances: Nicotine   Substance and Sexual Activity  . Alcohol use: Yes    Comment: once a week  . Drug use: Yes    Types: Marijuana    Comment: history of cocaine use, been about a year, per patient (12/18/21)  . Sexual activity: Not Currently    Birth control/protection: Surgical  Other Topics Concern  . Not on file  Social History Narrative  . Not on file   Social Drivers of Health   Financial Resource Strain: Medium Risk (08/07/2023)   Overall Financial Resource Strain (CARDIA)   . Difficulty of Paying Living Expenses: Somewhat hard  Food Insecurity: No Food Insecurity (01/12/2024)   Hunger Vital Sign   . Worried About Programme researcher, broadcasting/film/video in the Last Year: Never true   . Ran Out of Food in the Last Year: Never true  Transportation Needs: No Transportation Needs (01/12/2024)   PRAPARE  - Transportation   . Lack of Transportation (Medical): No   . Lack of Transportation (Non-Medical): No  Physical Activity: Inactive (08/07/2023)   Exercise Vital Sign   . Days of Exercise per Week: 0 days   . Minutes of Exercise per Session: 0 min  Stress: No Stress Concern Present (08/07/2023)   [employer] of Occupational Health - Occupational Stress Questionnaire   . Feeling of Stress : Only a little  Social Connections: Moderately Integrated (01/09/2024)   Social Connection and Isolation Panel   . Frequency of Communication with Friends and Family: More than three times a week   . Frequency of Social Gatherings with Friends and Family: More than three times a week   . Attends Religious Services: 1 to 4 times per year   . Active Member of Clubs or Organizations: No   . Attends Banker Meetings: 1 to 4 times per year   . Marital Status: Never married  Recent Concern: Social Connections - Moderately Isolated (12/04/2023)   Social Connection and Isolation Panel   . Frequency of Communication with Friends and Family: Twice a week   . Frequency of Social Gatherings with Friends and Family: Never   . Attends Religious Services: 1 to 4 times per year   . Active Member of Clubs or Organizations: No   . Attends Banker Meetings: 1 to 4 times per year   . Marital Status: Never married  Intimate Partner Violence: Not At Risk (01/12/2024)   Humiliation, Afraid, Rape, and Kick questionnaire   . Fear of Current or Ex-Partner: No   . Emotionally Abused: No   . Physically Abused: No   . Sexually Abused: No    Physical Exam      Future Appointments  Date Time Provider Department Center  03/12/2024  3:50 PM Vicci Barnie NOVAK, MD [facility] None  03/23/2024 12:30 PM [facility] None  03/23/2024  1:00 PM Onesimo Emaline Brink, MD [facility] None  04/09/2024  4:00 PM Collene Reginia BRAVO, PA [facility] None  04/13/2024  4:00 PM Kara Dorn NOVAK, MD  [facility] None  06/22/2024  4:10 PM [facility] ANNUAL WELLNESS VISIT [facility] None  07/13/2024  4:00 PM Floretta Mallard, MD [facility]

## 2024-02-19 ENCOUNTER — Other Ambulatory Visit (HOSPITAL_COMMUNITY): Payer: Self-pay

## 2024-02-19 ENCOUNTER — Ambulatory Visit: Admitting: Pulmonary Disease

## 2024-02-19 ENCOUNTER — Other Ambulatory Visit: Payer: Self-pay

## 2024-02-19 VITALS — BP 132/78 | HR 70 | Ht 61.0 in | Wt 162.0 lb

## 2024-02-19 DIAGNOSIS — J4489 Other specified chronic obstructive pulmonary disease: Secondary | ICD-10-CM

## 2024-02-19 DIAGNOSIS — Z87891 Personal history of nicotine dependence: Secondary | ICD-10-CM

## 2024-02-19 DIAGNOSIS — J9611 Chronic respiratory failure with hypoxia: Secondary | ICD-10-CM

## 2024-02-19 DIAGNOSIS — J939 Pneumothorax, unspecified: Secondary | ICD-10-CM

## 2024-02-19 DIAGNOSIS — F17210 Nicotine dependence, cigarettes, uncomplicated: Secondary | ICD-10-CM

## 2024-02-19 DIAGNOSIS — R911 Solitary pulmonary nodule: Secondary | ICD-10-CM

## 2024-02-19 DIAGNOSIS — Z122 Encounter for screening for malignant neoplasm of respiratory organs: Secondary | ICD-10-CM

## 2024-02-19 DIAGNOSIS — J441 Chronic obstructive pulmonary disease with (acute) exacerbation: Secondary | ICD-10-CM

## 2024-02-19 MED ORDER — PREDNISONE 20 MG PO TABS
20.0000 mg | ORAL_TABLET | Freq: Every day | ORAL | 0 refills | Status: DC
Start: 2024-02-19 — End: 2024-03-12
  Filled 2024-02-19 (×2): qty 7, 7d supply, fill #0

## 2024-02-19 MED ORDER — AZITHROMYCIN 250 MG PO TABS
250.0000 mg | ORAL_TABLET | Freq: Every day | ORAL | 1 refills | Status: DC
  Filled 2024-02-19 (×2): qty 30, 30d supply, fill #0

## 2024-02-19 NOTE — Patient Instructions (Signed)
 Sent in prescription for azithromycin  250 to be used daily  Sent in prescription for prednisone  20 mg daily for about 7 days  Call us  with significant concerns  Keep previously scheduled appointment  Continue to work on quitting smoking completely  Continue current inhalers  Continue oxygen  supplementation

## 2024-02-19 NOTE — Progress Notes (Signed)
 [name] [name]    [id number]    June 01, 1956  Primary Care Physician:Johnson, Barnie [name], MD  Referring Physician: Vicci Barnie [name], MD [address] [place] [place],  [place] [address]  Chief complaint:   Patient with advanced chronic obstructive pulmonary disease being seen for an acute visit  HPI:  Was recently seen in the office  Has had multiple recent ED visits  Was prescribed azithromycin  to be used daily for very advanced COPD with daily symptoms Since she ran out of medications has been feeling very short of breath  She felt while she was on azithromycin  she was doing much better  Compliant with Trelegy  Tries to stay active  Unfortunately still smokes about 5 cigarettes a day  Compliant with oxygen  use  Increased cough, congestion Denies any chest pains or chest discomfort Thick mucus, yellow, no fever Desaturates easily with activity  Does suffer from insomnia  Recent hospitalization, during the hospitalization she was positive for rhinovirus and parainfluenza  Past history of lung cancer  She was accompanied by her daughter during the visit  Outpatient Encounter Medications as of 02/19/2024  Medication Sig   albuterol  (VENTOLIN  HFA) 108 (90 Base) MCG/ACT inhaler INHALE 2 PUFFS into THE lungs EVERY 6 HOURS AS NEEDED wheezing AND SHORTNESS OF BREATH   alendronate  (FOSAMAX ) 70 MG tablet Take 1 tablet (70 mg total) by mouth every 7 (seven) days. Take with a full glass of water on an empty stomach.   aspirin  EC 81 MG tablet Take 81 mg by mouth in the morning.   atorvastatin  (LIPITOR ) 80 MG tablet Take 1 tablet (80 mg total) by mouth daily.   azithromycin  (ZITHROMAX ) 250 MG tablet Take 1 tablet (250 mg total) by mouth daily for 60 doses.   busPIRone  (BUSPAR ) 15 MG tablet Take 2 tablets (30 mg total) by mouth in the morning AND 1 tablet (15 mg total) daily at 12 noon AND 1 tablet (15 mg total) every evening.   clonazePAM  (KLONOPIN ) 0.5 MG tablet Take  1 tablet (0.5 mg total) by mouth 2 (two) times daily as needed.   doxepin  (SINEQUAN ) 75 MG capsule Take 1 capsule (75 mg total) by mouth at bedtime.   escitalopram  (LEXAPRO ) 20 MG tablet Take 1 tablet (20 mg total) by mouth daily.   ezetimibe  (ZETIA ) 10 MG tablet Take 1 tablet (10 mg total) by mouth daily.   Fluticasone -Umeclidin-Vilant (TRELEGY ELLIPTA ) 200-62.5-25 MCG/ACT AEPB Inhale 1 puff into the lungs daily.   furosemide  (LASIX ) 40 MG tablet Take 1 tablet (40 mg total) by mouth daily.   gabapentin  (NEURONTIN ) 600 MG tablet Take 1 tablet (600 mg total) by mouth 2 (two) times daily.   hydrOXYzine  (ATARAX ) 25 MG tablet Take 1 tablet (25 mg total) by mouth at bedtime as needed.   ipratropium-albuterol  (DUONEB) 0.5-2.5 (3) MG/3ML SOLN Take 3 mLs by nebulization every 8 (eight) hours.   levothyroxine  (SYNTHROID ) 112 MCG tablet Take 1 tablet (112 mcg total) by mouth daily.   metFORMIN  (GLUCOPHAGE ) 500 MG tablet Take 1 tablet (500 mg total) by mouth 2 (two) times daily with a meal.   mirtazapine  (REMERON ) 45 MG tablet Take 1 tablet (45 mg total) by mouth at bedtime.   montelukast  (SINGULAIR ) 10 MG tablet Take 1 tablet (10 mg total) by mouth at bedtime.   nebivolol  (BYSTOLIC ) 10 MG tablet Take 1 tablet (10 mg total) by mouth daily.   nitroGLYCERIN  (NITROSTAT ) 0.4 MG SL tablet Place  1 tablet (0.4 mg total) under the tongue every 5 (five) minutes as needed for chest pain.   omeprazole  (PRILOSEC) 40 MG capsule Take 1 capsule (40 mg total) by mouth 2 (two) times daily before a meal.   OXYGEN  Inhale 2-3 L/min into the lungs continuous.   predniSONE  (DELTASONE ) 20 MG tablet Take 1 tablet (20 mg total) by mouth daily with breakfast.   ticagrelor  (BRILINTA ) 60 MG TABS tablet Take 1 tablet (60 mg total) by mouth 2 (two) times daily.   varenicline  (CHANTIX ) 1 MG tablet Take 1 tablet (1 mg total) by mouth 2 (two) times daily.   No facility-administered encounter medications on file as of 02/19/2024.     Allergies as of 02/19/2024 - Review Complete 02/19/2024  Allergen Reaction Noted   Avocado Anaphylaxis 09/15/2017   Latex Shortness Of Breath and Rash 12/22/2008   Codeine  Nausea Only 12/22/2008    Past Medical History:  Diagnosis Date   Allergy    seasonal   Anemia    Anxiety    takes Lexapro  daily   Arthritis    back, from neck down pass my bra area (03/25/2017)   Asthma    Bartholin gland cyst 08/29/2011   Bruises easily    pt is on Effient    Chronic back pain    herniated nucleus pulposus   Chronic back pain    neck to bra area; lower back (03/25/2017)   Chronic kidney disease    recurrent UTI's this year 2022   COPD (chronic obstructive pulmonary disease) (HCC)    early stages   Coronary artery disease    Depression    takes Klonopin  daily   Diabetes mellitus without complication (HCC)    Diverticulosis    Fibroadenoma of left breast    GERD (gastroesophageal reflux disease)    takes Nexium  daily   H/O hiatal hernia    Heart attack (HCC) 2011   Hemorrhoids    Hernia    Hyperlipidemia    takes Lipitor  daily   Hypertension    takes Losartan  daily and Labetalol  bid   Hypothyroidism    takes Synthroid  daily   Insomnia    hydroxyzine  prn   Joint pain    Pneumonia    couple times (03/25/2017)   Pre-diabetes    just found out 1 wk ago (03/25/2017)   Psoriasis    elbows,knees,back   Shortness of breath    with exertion   Slowing of urinary stream    Stress incontinence     Past Surgical History:  Procedure Laterality Date   ABDOMINAL EXPLORATION SURGERY  1977   ABDOMINAL HYSTERECTOMY  1977   left one of my ovaries   BACK SURGERY     BIOPSY  05/27/2019   Procedure: BIOPSY;  Surgeon: [name] Lamar HERO, MD;  Location: [facility];  Service: Endoscopy;;   BREAST BIOPSY Left 11/2021   x2   COLONOSCOPY  2011   Dr. Teressa: Mild diverticulosis, descending diminutive colon polyp (not retrieved), next colonoscopy 10 years   CORONARY ANGIOPLASTY  WITH STENT PLACEMENT  2011 X2   regular stents didn't work; had to go back in in ~ 1 month and put in medicated stents   DILATION AND CURETTAGE OF UTERUS     ESOPHAGOGASTRODUODENOSCOPY     ESOPHAGOGASTRODUODENOSCOPY (EGD) WITH PROPOFOL  N/A 05/27/2019   normal esophagus, dilation, erosive gastropathy s/p biopsy, normal duodenum. Negative H.pylori.    LEFT HEART CATH AND CORONARY ANGIOGRAPHY N/A 03/26/2017   Procedure: LEFT  HEART CATH AND CORONARY ANGIOGRAPHY;  Surgeon: Claudene Victory ORN, MD;  Location: [facility] INVASIVE CV LAB;  Service: Cardiovascular;  Laterality: N/A;   LUMBAR LAMINECTOMY/DECOMPRESSION MICRODISCECTOMY  08/05/2011   Procedure: LUMBAR LAMINECTOMY/DECOMPRESSION MICRODISCECTOMY;  Surgeon: Lynwood JONELLE Mill, MD;  Location: [facility] NEUR[facility] ORS;  Service: Neurosurgery;  Laterality: Right;  Right Lumbar four-five extraforaminal discectomy   MALONEY DILATION N/A 05/27/2019   Procedure: AGAPITO DILATION;  Surgeon: [name] Lamar HERO, MD;  Location: [facility];  Service: Endoscopy;  Laterality: N/A;  54   SHOULDER ARTHROSCOPY WITH ROTATOR CUFF REPAIR AND OPEN BICEPS TENODESIS Right 12/24/2021   Procedure: RIGHT SHOULDER ARTHROSCOPY WITH ROTATOR CUFF REPAIR AND BICEPS TENODESIS;  Surgeon: Barbarann Oneil BROCKS, MD;  Location: [facility] OR;  Service: Orthopedics;  Laterality: Right;   TONSILLECTOMY     as a child   UPPER GASTROINTESTINAL ENDOSCOPY      Family History  Problem Relation Age of Onset   Heart disease Mother    Hypertension Mother    Stroke Mother    Mental illness Mother    Heart disease Father    Hypertension Father    Diabetes Father    Colon polyps Brother    Heart disease Maternal Grandfather    Cancer Paternal Grandmother        mouth   Anesthesia problems Daughter    Breast cancer Maternal Aunt    Throat cancer Maternal Uncle    Thyroid  disease Paternal Aunt    Hypotension Neg Hx    Malignant hyperthermia Neg Hx    Pseudochol deficiency Neg Hx    Colon cancer Neg Hx    Stomach cancer  Neg Hx    Esophageal cancer Neg Hx    Pancreatic cancer Neg Hx    Rectal cancer Neg Hx     Social History   Socioeconomic History   Marital status: Divorced    Spouse name: Not on file   Number of children: 1   Years of education: Not on file   Highest education level: 9th grade  Occupational History   Not on file  Tobacco Use   Smoking status: Every Day    Current packs/day: 0.50    Average packs/day: 0.5 packs/day for 50.0 years (25.0 ttl pk-yrs)    Types: Cigarettes   Smokeless tobacco: Never  Vaping Use   Vaping status: Some Days   Substances: Nicotine   Substance and Sexual Activity   Alcohol use: Yes    Comment: once a week   Drug use: Yes    Types: Marijuana    Comment: history of cocaine use, been about a year, per patient (12/18/21)   Sexual activity: Not Currently    Birth control/protection: Surgical  Other Topics Concern   Not on file  Social History Narrative   Not on file   Social Drivers of Health   Financial Resource Strain: Medium Risk (08/07/2023)   Overall Financial Resource Strain (CARDIA)    Difficulty of Paying Living Expenses: Somewhat hard  Food Insecurity: No Food Insecurity (01/12/2024)   Hunger Vital Sign    Worried About Running Out of Food in the Last Year: Never true    Ran Out of Food in the Last Year: Never true  Transportation Needs: No Transportation Needs (01/12/2024)   PRAPARE - Administrator, Civil Service (Medical): No    Lack of Transportation (Non-Medical): No  Physical Activity: Inactive (08/07/2023)   Exercise Vital Sign    Days of Exercise per Week: 0 days  Minutes of Exercise per Session: 0 min  Stress: No Stress Concern Present (08/07/2023)   [employer] of Occupational Health - Occupational Stress Questionnaire    Feeling of Stress : Only a little  Social Connections: Moderately Integrated (01/09/2024)   Social Connection and Isolation Panel    Frequency of Communication with Friends and Family: More  than three times a week    Frequency of Social Gatherings with Friends and Family: More than three times a week    Attends Religious Services: 1 to 4 times per year    Active Member of [employer] or Organizations: No    Attends Engineer, structural: 1 to 4 times per year    Marital Status: Never married  Recent Concern: Social Connections - Moderately Isolated (12/04/2023)   Social Connection and Isolation Panel    Frequency of Communication with Friends and Family: Twice a week    Frequency of Social Gatherings with Friends and Family: Never    Attends Religious Services: 1 to 4 times per year    Active Member of [employer] or Organizations: No    Attends Engineer, structural: 1 to 4 times per year    Marital Status: Never married  Intimate Partner Violence: Not At Risk (01/12/2024)   Humiliation, Afraid, Rape, and Kick questionnaire    Fear of Current or Ex-Partner: No    Emotionally Abused: No    Physically Abused: No    Sexually Abused: No    Review of Systems  Respiratory:  Positive for cough and shortness of breath.   Psychiatric/Behavioral:  Positive for sleep disturbance.     Vitals:   02/19/24 1453  BP: 132/78  Pulse: 70  SpO2: 96%     Physical Exam Constitutional:      Appearance: She is obese.  HENT:     Head: Normocephalic.     Mouth/Throat:     Mouth: Mucous membranes are moist.  Eyes:     General: No scleral icterus.    Pupils: Pupils are equal, round, and reactive to light.  Neck:     Thyroid : No thyromegaly.  Cardiovascular:     Rate and Rhythm: Normal rate and regular rhythm.     Heart sounds: No murmur heard.    No friction rub.  Pulmonary:     Effort: No respiratory distress.     Breath sounds: No stridor. No rhonchi.  Musculoskeletal:     Cervical back: No rigidity or tenderness.  Neurological:     Mental Status: She is alert.  Psychiatric:        Mood and Affect: Mood normal.    Data Reviewed: CT scan of the chest 10/05/2023  reviewed, small pleural effusion, bibasal atelectasis rib fractures  Echocardiogram May 2025 with normal ejection fraction, normal right-sided pressures  Last EKG was reviewed by myself showing a QTc  Assessment/Plan: Advanced chronic obstructive pulmonary disease on Trelegy - Compliant with medications - Daily symptoms - Multiple recent ED visits - On Ensifentrine   Daily azithromycin  was discussed with during the last visit and while she was on the medication, she felt better  Chronic respiratory failure on oxygen  around-the-clock - Encouraged to continue oxygen   Graded activities as tolerated  Active smoker - Smoking cessation counseling provided  Patient has significant disease with progressive limitations, will benefit from azithromycin  to reduce exacerbations  Will follow closely    Jennet Epley MD [facility] 02/19/2024, 10:10 PM  CC: [name] Barnie NOVAK, MD

## 2024-02-20 ENCOUNTER — Other Ambulatory Visit: Payer: Self-pay

## 2024-02-20 ENCOUNTER — Other Ambulatory Visit (HOSPITAL_COMMUNITY): Payer: Self-pay

## 2024-02-23 ENCOUNTER — Other Ambulatory Visit (HOSPITAL_COMMUNITY): Payer: Self-pay

## 2024-02-23 ENCOUNTER — Other Ambulatory Visit: Payer: Self-pay | Admitting: Gastroenterology

## 2024-02-23 ENCOUNTER — Other Ambulatory Visit: Payer: Self-pay

## 2024-02-23 DIAGNOSIS — K219 Gastro-esophageal reflux disease without esophagitis: Secondary | ICD-10-CM

## 2024-02-23 MED ORDER — OMEPRAZOLE 40 MG PO CPDR
40.0000 mg | DELAYED_RELEASE_CAPSULE | Freq: Two times a day (BID) | ORAL | 1 refills | Status: DC
  Filled 2024-02-23: qty 60, 30d supply, fill #0
  Filled 2024-03-23: qty 60, 30d supply, fill #1

## 2024-02-24 ENCOUNTER — Other Ambulatory Visit: Payer: Self-pay

## 2024-02-25 DIAGNOSIS — I130 Hypertensive heart and chronic kidney disease with heart failure and stage 1 through stage 4 chronic kidney disease, or unspecified chronic kidney disease: Secondary | ICD-10-CM | POA: Diagnosis not present

## 2024-02-25 DIAGNOSIS — J441 Chronic obstructive pulmonary disease with (acute) exacerbation: Secondary | ICD-10-CM | POA: Diagnosis not present

## 2024-02-25 DIAGNOSIS — F32A Depression, unspecified: Secondary | ICD-10-CM | POA: Diagnosis not present

## 2024-02-25 DIAGNOSIS — D649 Anemia, unspecified: Secondary | ICD-10-CM | POA: Diagnosis not present

## 2024-02-25 DIAGNOSIS — E1122 Type 2 diabetes mellitus with diabetic chronic kidney disease: Secondary | ICD-10-CM | POA: Diagnosis not present

## 2024-02-25 DIAGNOSIS — N189 Chronic kidney disease, unspecified: Secondary | ICD-10-CM | POA: Diagnosis not present

## 2024-02-25 DIAGNOSIS — F411 Generalized anxiety disorder: Secondary | ICD-10-CM | POA: Diagnosis not present

## 2024-02-25 DIAGNOSIS — I5033 Acute on chronic diastolic (congestive) heart failure: Secondary | ICD-10-CM | POA: Diagnosis not present

## 2024-02-25 DIAGNOSIS — I2510 Atherosclerotic heart disease of native coronary artery without angina pectoris: Secondary | ICD-10-CM | POA: Diagnosis not present

## 2024-02-26 ENCOUNTER — Other Ambulatory Visit: Payer: Self-pay

## 2024-02-27 ENCOUNTER — Other Ambulatory Visit: Payer: Self-pay

## 2024-02-27 ENCOUNTER — Other Ambulatory Visit (HOSPITAL_COMMUNITY): Payer: Self-pay

## 2024-03-01 DIAGNOSIS — F32A Depression, unspecified: Secondary | ICD-10-CM | POA: Diagnosis not present

## 2024-03-01 DIAGNOSIS — I130 Hypertensive heart and chronic kidney disease with heart failure and stage 1 through stage 4 chronic kidney disease, or unspecified chronic kidney disease: Secondary | ICD-10-CM | POA: Diagnosis not present

## 2024-03-01 DIAGNOSIS — J441 Chronic obstructive pulmonary disease with (acute) exacerbation: Secondary | ICD-10-CM | POA: Diagnosis not present

## 2024-03-01 DIAGNOSIS — F411 Generalized anxiety disorder: Secondary | ICD-10-CM | POA: Diagnosis not present

## 2024-03-01 DIAGNOSIS — N189 Chronic kidney disease, unspecified: Secondary | ICD-10-CM | POA: Diagnosis not present

## 2024-03-01 DIAGNOSIS — D649 Anemia, unspecified: Secondary | ICD-10-CM | POA: Diagnosis not present

## 2024-03-01 DIAGNOSIS — I5033 Acute on chronic diastolic (congestive) heart failure: Secondary | ICD-10-CM | POA: Diagnosis not present

## 2024-03-01 DIAGNOSIS — E1122 Type 2 diabetes mellitus with diabetic chronic kidney disease: Secondary | ICD-10-CM | POA: Diagnosis not present

## 2024-03-01 DIAGNOSIS — I2510 Atherosclerotic heart disease of native coronary artery without angina pectoris: Secondary | ICD-10-CM | POA: Diagnosis not present

## 2024-03-04 ENCOUNTER — Other Ambulatory Visit (HOSPITAL_COMMUNITY): Payer: Self-pay | Admitting: Emergency Medicine

## 2024-03-04 NOTE — Progress Notes (Signed)
 Paramedicine Encounter    Patient ID: [name] [name], female    DOB: August 08, 1955, 68 y.o.   MRN: [id number]   Complaints NONE  Assessment A&O x 4, skin W&D w/ good color.  Denies chest pain or SOB.  Lung sounds w/ expiratory wheezes in upper lobes.  No edema noted.  Compliance with meds YES   Pill box filled YES   Refills needed NONE  Meds changes since last visit Prednisone  and Azithromycin    Social changes NONE   BP 118/70 (BP Location: Left Arm, Patient Position: Sitting, Cuff Size: Normal)   Pulse 66   Resp 16   Wt 161 lb (73 kg)   SpO2 96%   BMI 30.42 kg/m  Weight yesterday- Last visit weight-163lb  ACTION: Home visit completed  [name] [phone] 03/04/24  Patient Care Team: Vicci Barnie [name], MD as PCP - General (Internal Medicine) Burnard Debby LABOR, MD (Inactive) as PCP - Cardiology (Cardiology) Shaaron Lamar HERO, MD as Consulting Physician (Gastroenterology) Onesimo Emaline Brink, MD as Consulting Physician (Hematology) Collene Reginia BRAVO, [place] as Physician Assistant (Physician Assistant) Carolee Heron [name], RN as Case Manager  Patient Active Problem List   Diagnosis Date Noted  . Hypokalemia 01/08/2024  . Acute on chronic heart failure with preserved ejection fraction (HFpEF, >= 50%) (HCC) 12/05/2023  . At risk for polypharmacy 12/04/2023  . Long-term current use of antidepressant 11/17/2023  . Long-term current use of benzodiazepine 11/17/2023  . Sigmoid diverticulitis 11/12/2023  . Acute kidney injury superimposed on chronic kidney disease (HCC) 11/12/2023  . CKD (chronic kidney disease) stage 2, GFR 60-89 ml/min 11/12/2023  . Sepsis secondary to sigmoid diverticulitis (HCC) 11/12/2023  . Acute on chronic hypoxic respiratory failure (HCC) 10/05/2023  . Pneumothorax, right 09/16/2023  . COPD with acute exacerbation (HCC) 09/01/2023  . Chronic hypoxic respiratory failure (HCC) 09/01/2023  . Continuous dependence on cigarette smoking  09/01/2023  . History of CAD (coronary artery disease) 09/01/2023  . CAP (community acquired pneumonia) 09/01/2023  . Full code status 06/14/2023  . Acute hypoxic respiratory failure (HCC) 06/13/2023  . Acute exacerbation of chronic obstructive pulmonary disease (COPD) (HCC) 06/12/2023  . Nicotine  dependence 05/03/2023  . Substance abuse (HCC) 09/10/2022  . Iron  deficiency anemia 11/02/2021  . Major depressive disorder, recurrent, in full remission with anxious distress (HCC) 05/31/2021  . Chronic neck pain 02/26/2021  . GAD (generalized anxiety disorder) 01/08/2021  . Esophageal dysphagia 04/28/2019  . Bipolar disorder (HCC) 09/17/2018  . Insomnia 10/28/2017  . Prediabetes 05/28/2017  . QT prolongation 03/25/2017  . Psoriasis 06/22/2015  . COPD (chronic obstructive pulmonary disease) (HCC)GOLD E 05/18/2015  . Anxiety and depression 07/10/2013  . Essential hypertension 06/07/2013  . Hyperlipidemia 06/07/2013  . Asthma, chronic 06/07/2013  . Emphysema lung (HCC) 06/07/2013  . Chronic back pain 06/07/2013  . CAD S/P percutaneous coronary angioplasty 06/07/2013  . GERD (gastroesophageal reflux disease) 06/07/2013  . Breast lump on left side at 3 o'clock position 10/13/2012  . S/P abdominal hysterectomy and right salpingo-oophorectomy 08/29/2011  . COPD exacerbation (HCC) 09/15/2009  . TOBACCO ABUSE 07/17/2009  . Chronic rhinitis 07/17/2009  . Lung nodule < 6cm on CT 07/17/2009  . Hypothyroidism 12/22/2008  . Chronic depression 12/22/2008    Current Outpatient Medications:  .  albuterol  (VENTOLIN  HFA) 108 (90 Base) MCG/ACT inhaler, INHALE 2 PUFFS into THE lungs EVERY 6 HOURS AS NEEDED wheezing AND SHORTNESS OF BREATH, Disp: 54 g, Rfl: 1 .  alendronate  (FOSAMAX ) 70 MG tablet, Take 1  tablet (70 mg total) by mouth every 7 (seven) days. Take with a full glass of water on an empty stomach., Disp: 12 tablet, Rfl: 1 .  aspirin  EC 81 MG tablet, Take 81 mg by mouth in the morning., Disp: ,  Rfl:  .  atorvastatin  (LIPITOR ) 80 MG tablet, Take 1 tablet (80 mg total) by mouth daily., Disp: 90 tablet, Rfl: 3 .  azithromycin  (ZITHROMAX ) 250 MG tablet, Take 1 tablet (250 mg total) by mouth daily for 60 doses., Disp: 30 tablet, Rfl: 1 .  busPIRone  (BUSPAR ) 15 MG tablet, Take 2 tablets (30 mg total) by mouth in the morning AND 1 tablet (15 mg total) daily at 12 noon AND 1 tablet (15 mg total) every evening., Disp: 360 tablet, Rfl: 0 .  clonazePAM  (KLONOPIN ) 0.5 MG tablet, Take 1 tablet (0.5 mg total) by mouth 2 (two) times daily as needed., Disp: 60 tablet, Rfl: 0 .  doxepin  (SINEQUAN ) 75 MG capsule, Take 1 capsule (75 mg total) by mouth at bedtime., Disp: 90 capsule, Rfl: 0 .  escitalopram  (LEXAPRO ) 20 MG tablet, Take 1 tablet (20 mg total) by mouth daily., Disp: 90 tablet, Rfl: 0 .  ezetimibe  (ZETIA ) 10 MG tablet, Take 1 tablet (10 mg total) by mouth daily., Disp: 90 tablet, Rfl: 3 .  Fluticasone -Umeclidin-Vilant (TRELEGY ELLIPTA ) 200-62.5-25 MCG/ACT AEPB, Inhale 1 puff into the lungs daily., Disp: 60 each, Rfl: 11 .  furosemide  (LASIX ) 40 MG tablet, Take 1 tablet (40 mg total) by mouth daily., Disp: 90 tablet, Rfl: 1 .  gabapentin  (NEURONTIN ) 600 MG tablet, Take 1 tablet (600 mg total) by mouth 2 (two) times daily., Disp: 180 tablet, Rfl: 2 .  hydrOXYzine  (ATARAX ) 25 MG tablet, Take 1 tablet (25 mg total) by mouth at bedtime as needed., Disp: 30 tablet, Rfl: 2 .  ipratropium-albuterol  (DUONEB) 0.5-2.5 (3) MG/3ML SOLN, Take 3 mLs by nebulization every 8 (eight) hours., Disp: 90 mL, Rfl: 1 .  levothyroxine  (SYNTHROID ) 112 MCG tablet, Take 1 tablet (112 mcg total) by mouth daily., Disp: 90 tablet, Rfl: 2 .  metFORMIN  (GLUCOPHAGE ) 500 MG tablet, Take 1 tablet (500 mg total) by mouth 2 (two) times daily with a meal., Disp: 180 tablet, Rfl: 1 .  mirtazapine  (REMERON ) 45 MG tablet, Take 1 tablet (45 mg total) by mouth at bedtime., Disp: 90 tablet, Rfl: 0 .  montelukast  (SINGULAIR ) 10 MG tablet, Take  1 tablet (10 mg total) by mouth at bedtime., Disp: 90 tablet, Rfl: 2 .  nebivolol  (BYSTOLIC ) 10 MG tablet, Take 1 tablet (10 mg total) by mouth daily., Disp: 90 tablet, Rfl: 3 .  nitroGLYCERIN  (NITROSTAT ) 0.4 MG SL tablet, Place 1 tablet (0.4 mg total) under the tongue every 5 (five) minutes as needed for chest pain., Disp: 25 tablet, Rfl: 3 .  omeprazole  (PRILOSEC) 40 MG capsule, Take 1 capsule (40 mg total) by mouth 2 (two) times daily before a meal., Disp: 60 capsule, Rfl: 1 .  OXYGEN , Inhale 2-3 L/min into the lungs continuous., Disp: , Rfl:  .  ticagrelor  (BRILINTA ) 60 MG TABS tablet, Take 1 tablet (60 mg total) by mouth 2 (two) times daily., Disp: 180 tablet, Rfl: 2 .  varenicline  (CHANTIX ) 1 MG tablet, Take 1 tablet (1 mg total) by mouth 2 (two) times daily., Disp: 60 tablet, Rfl: 3 .  predniSONE  (DELTASONE ) 20 MG tablet, Take 1 tablet (20 mg total) by mouth daily with breakfast. (Patient not taking: Reported on 03/04/2024), Disp: 7 tablet, Rfl: 0 Allergies  Allergen  Reactions  . Avocado Anaphylaxis  . Latex Shortness Of Breath and Rash  . Codeine  Nausea Only     Social History   Socioeconomic History  . Marital status: Divorced    Spouse name: Not on file  . Number of children: 1  . Years of education: Not on file  . Highest education level: 9th grade  Occupational History  . Not on file  Tobacco Use  . Smoking status: Every Day    Current packs/day: 0.50    Average packs/day: 0.5 packs/day for 50.0 years (25.0 ttl pk-yrs)    Types: Cigarettes  . Smokeless tobacco: Never  Vaping Use  . Vaping status: Some Days  . Substances: Nicotine   Substance and Sexual Activity  . Alcohol use: Yes    Comment: once a week  . Drug use: Yes    Types: Marijuana    Comment: history of cocaine use, been about a year, per patient (12/18/21)  . Sexual activity: Not Currently    Birth control/protection: Surgical  Other Topics Concern  . Not on file  Social History Narrative  . Not on  file   Social Drivers of Health   Financial Resource Strain: Medium Risk (08/07/2023)   Overall Financial Resource Strain (CARDIA)   . Difficulty of Paying Living Expenses: Somewhat hard  Food Insecurity: No Food Insecurity (01/12/2024)   Hunger Vital Sign   . Worried About Programme researcher, broadcasting/film/video in the Last Year: Never true   . Ran Out of Food in the Last Year: Never true  Transportation Needs: No Transportation Needs (01/12/2024)   PRAPARE - Transportation   . Lack of Transportation (Medical): No   . Lack of Transportation (Non-Medical): No  Physical Activity: Inactive (08/07/2023)   Exercise Vital Sign   . Days of Exercise per Week: 0 days   . Minutes of Exercise per Session: 0 min  Stress: No Stress Concern Present (08/07/2023)   [employer] of Occupational Health - Occupational Stress Questionnaire   . Feeling of Stress : Only a little  Social Connections: Moderately Integrated (01/09/2024)   Social Connection and Isolation Panel   . Frequency of Communication with Friends and Family: More than three times a week   . Frequency of Social Gatherings with Friends and Family: More than three times a week   . Attends Religious Services: 1 to 4 times per year   . Active Member of Clubs or Organizations: No   . Attends Banker Meetings: 1 to 4 times per year   . Marital Status: Never married  Recent Concern: Social Connections - Moderately Isolated (12/04/2023)   Social Connection and Isolation Panel   . Frequency of Communication with Friends and Family: Twice a week   . Frequency of Social Gatherings with Friends and Family: Never   . Attends Religious Services: 1 to 4 times per year   . Active Member of Clubs or Organizations: No   . Attends Banker Meetings: 1 to 4 times per year   . Marital Status: Never married  Intimate Partner Violence: Not At Risk (01/12/2024)   Humiliation, Afraid, Rape, and Kick questionnaire   . Fear of Current or Ex-Partner: No    . Emotionally Abused: No   . Physically Abused: No   . Sexually Abused: No    Physical Exam      Future Appointments  Date Time Provider Department Center  03/12/2024  3:50 PM Vicci Barnie NOVAK, MD [facility] None  03/23/2024 12:30  PM [facility] None  03/23/2024  1:00 PM Onesimo Emaline Brink, MD [facility] None  04/09/2024  4:00 PM Collene Reginia BRAVO, PA [facility] None  04/13/2024  4:00 PM Kara Dorn NOVAK, MD [facility] None  05/24/2024  4:00 PM Kara Dorn NOVAK, MD [facility] None  06/22/2024  4:10 PM [facility] ANNUAL WELLNESS VISIT [facility] None  07/13/2024  4:00 PM Floretta Mallard, MD [facility]

## 2024-03-05 ENCOUNTER — Encounter: Payer: Self-pay | Admitting: Hematology

## 2024-03-06 DIAGNOSIS — D649 Anemia, unspecified: Secondary | ICD-10-CM | POA: Diagnosis not present

## 2024-03-06 DIAGNOSIS — I130 Hypertensive heart and chronic kidney disease with heart failure and stage 1 through stage 4 chronic kidney disease, or unspecified chronic kidney disease: Secondary | ICD-10-CM | POA: Diagnosis not present

## 2024-03-06 DIAGNOSIS — E1122 Type 2 diabetes mellitus with diabetic chronic kidney disease: Secondary | ICD-10-CM | POA: Diagnosis not present

## 2024-03-06 DIAGNOSIS — F32A Depression, unspecified: Secondary | ICD-10-CM | POA: Diagnosis not present

## 2024-03-06 DIAGNOSIS — N189 Chronic kidney disease, unspecified: Secondary | ICD-10-CM | POA: Diagnosis not present

## 2024-03-06 DIAGNOSIS — I2510 Atherosclerotic heart disease of native coronary artery without angina pectoris: Secondary | ICD-10-CM | POA: Diagnosis not present

## 2024-03-06 DIAGNOSIS — F411 Generalized anxiety disorder: Secondary | ICD-10-CM | POA: Diagnosis not present

## 2024-03-06 DIAGNOSIS — J441 Chronic obstructive pulmonary disease with (acute) exacerbation: Secondary | ICD-10-CM | POA: Diagnosis not present

## 2024-03-06 DIAGNOSIS — I5033 Acute on chronic diastolic (congestive) heart failure: Secondary | ICD-10-CM | POA: Diagnosis not present

## 2024-03-08 ENCOUNTER — Telehealth: Admitting: Family Medicine

## 2024-03-08 ENCOUNTER — Other Ambulatory Visit (HOSPITAL_COMMUNITY): Payer: Self-pay

## 2024-03-08 DIAGNOSIS — R300 Dysuria: Secondary | ICD-10-CM

## 2024-03-08 MED ORDER — CEPHALEXIN 500 MG PO CAPS
500.0000 mg | ORAL_CAPSULE | Freq: Two times a day (BID) | ORAL | 0 refills | Status: DC
  Filled 2024-03-08: qty 14, 7d supply, fill #0

## 2024-03-08 NOTE — Progress Notes (Signed)
 E-Visit for Urinary Problems  We are sorry that you are not feeling well.  Here is how we plan to help!  Based on what you shared with me it looks like you most likely have a simple urinary tract infection.  A UTI (Urinary Tract Infection) is a bacterial infection of the bladder.  Most cases of urinary tract infections are simple to treat but a key part of your care is to encourage you to drink plenty of fluids and watch your symptoms carefully.  I have prescribed Keflex 500 mg twice a day for 7 days.  Your symptoms should gradually improve. Call us if the burning in your urine worsens, you develop worsening fever, back pain or pelvic pain or if your symptoms do not resolve after completing the antibiotic.  Urinary tract infections can be prevented by drinking plenty of water to keep your body hydrated.  Also be sure when you wipe, wipe from front to back and don't hold it in!  If possible, empty your bladder every 4 hours.  HOME CARE Drink plenty of fluids Compete the full course of the antibiotics even if the symptoms resolve Remember, when you need to go.go. Holding in your urine can increase the likelihood of getting a UTI! GET HELP RIGHT AWAY IF: You cannot urinate You get a high fever Worsening back pain occurs You see blood in your urine You feel sick to your stomach or throw up You feel like you are going to pass out  MAKE SURE YOU  Understand these instructions. Will watch your condition. Will get help right away if you are not doing well or get worse.   Thank you for choosing an e-visit.  Your e-visit answers were reviewed by a board certified advanced clinical practitioner to complete your personal care plan. Depending upon the condition, your plan could have included both over the counter or prescription medications.  Please review your pharmacy choice. Make sure the pharmacy is open so you can pick up prescription now. If there is a problem, you may contact your  provider through [employer] and have the prescription routed to another pharmacy.  Your safety is important to [place]. If you have drug allergies check your prescription carefully.   For the next 24 hours you can use MyChart to ask questions about today's visit, request a non-urgent call back, or ask for a work or school excuse. You will get an email in the next two days asking about your experience. I hope that your e-visit has been valuable and will speed your recovery.  I provided 5 minutes of non face-to-face time during this encounter for chart review, medication and order placement, as well as and documentation.

## 2024-03-09 ENCOUNTER — Telehealth: Payer: Self-pay | Admitting: Internal Medicine

## 2024-03-09 DIAGNOSIS — I5033 Acute on chronic diastolic (congestive) heart failure: Secondary | ICD-10-CM | POA: Diagnosis not present

## 2024-03-09 DIAGNOSIS — D649 Anemia, unspecified: Secondary | ICD-10-CM | POA: Diagnosis not present

## 2024-03-09 DIAGNOSIS — E1122 Type 2 diabetes mellitus with diabetic chronic kidney disease: Secondary | ICD-10-CM | POA: Diagnosis not present

## 2024-03-09 DIAGNOSIS — F32A Depression, unspecified: Secondary | ICD-10-CM | POA: Diagnosis not present

## 2024-03-09 DIAGNOSIS — F411 Generalized anxiety disorder: Secondary | ICD-10-CM | POA: Diagnosis not present

## 2024-03-09 DIAGNOSIS — J441 Chronic obstructive pulmonary disease with (acute) exacerbation: Secondary | ICD-10-CM | POA: Diagnosis not present

## 2024-03-09 DIAGNOSIS — I2510 Atherosclerotic heart disease of native coronary artery without angina pectoris: Secondary | ICD-10-CM | POA: Diagnosis not present

## 2024-03-09 DIAGNOSIS — I130 Hypertensive heart and chronic kidney disease with heart failure and stage 1 through stage 4 chronic kidney disease, or unspecified chronic kidney disease: Secondary | ICD-10-CM | POA: Diagnosis not present

## 2024-03-09 DIAGNOSIS — N189 Chronic kidney disease, unspecified: Secondary | ICD-10-CM | POA: Diagnosis not present

## 2024-03-09 NOTE — Telephone Encounter (Signed)
 Called patient to confirm upcoming appointment 03/12/2024, no answer. Unable to leave VM.

## 2024-03-11 ENCOUNTER — Other Ambulatory Visit (HOSPITAL_COMMUNITY): Payer: Self-pay | Admitting: Emergency Medicine

## 2024-03-11 NOTE — Progress Notes (Unsigned)
 Paramedicine Encounter    Patient ID: [name] [name], female    DOB: 06-14-1956, 68 y.o.   MRN: [id number]   Complaints NONE  Assessment A&O x 4, skin W&D w/ good color.  Compliance with meds YES  Pill box filled by daughter  Refills needed n/a  Meds changes since last visit NONE    Social changes NONE   BP 110/62 (BP Location: Left Arm, Cuff Size: Normal)   Pulse 70   Resp 16   Wt 161 lb (73 kg)   SpO2 98% Comment: with 3lpm via nasal cannula  BMI 30.42 kg/m  Weight yesterday- Last visit weight-161lb  ACTION: {Paramed Action:[id number]}  Mary Sharps, EMT-Paramedic [phone] 03/11/24  Patient Care Team: Vicci Barnie [name], MD as PCP - General (Internal Medicine) Burnard Debby LABOR, MD (Inactive) as PCP - Cardiology (Cardiology) Shaaron Lamar HERO, MD as Consulting Physician (Gastroenterology) Onesimo Emaline Brink, MD as Consulting Physician (Hematology) Collene Reginia BRAVO, [place] as Physician Assistant (Physician Assistant) Carolee Heron [name], RN as Case Manager  Patient Active Problem List   Diagnosis Date Noted   Hypokalemia 01/08/2024   Acute on chronic heart failure with preserved ejection fraction (HFpEF, >= 50%) (HCC) 12/05/2023   At risk for polypharmacy 12/04/2023   Long-term current use of antidepressant 11/17/2023   Long-term current use of benzodiazepine 11/17/2023   Sigmoid diverticulitis 11/12/2023   Acute kidney injury superimposed on chronic kidney disease (HCC) 11/12/2023   CKD (chronic kidney disease) stage 2, GFR 60-89 ml/min 11/12/2023   Sepsis secondary to sigmoid diverticulitis (HCC) 11/12/2023   Acute on chronic hypoxic respiratory failure (HCC) 10/05/2023   Pneumothorax, right 09/16/2023   COPD with acute exacerbation (HCC) 09/01/2023   Chronic hypoxic respiratory failure (HCC) 09/01/2023   Continuous dependence on cigarette smoking 09/01/2023   History of CAD (coronary artery disease) 09/01/2023   CAP (community acquired pneumonia) 09/01/2023    Full code status 06/14/2023   Acute hypoxic respiratory failure (HCC) 06/13/2023   Acute exacerbation of chronic obstructive pulmonary disease (COPD) (HCC) 06/12/2023   Nicotine  dependence 05/03/2023   Substance abuse (HCC) 09/10/2022   Iron  deficiency anemia 11/02/2021   Major depressive disorder, recurrent, in full remission with anxious distress (HCC) 05/31/2021   Chronic neck pain 02/26/2021   GAD (generalized anxiety disorder) 01/08/2021   Esophageal dysphagia 04/28/2019   Bipolar disorder (HCC) 09/17/2018   Insomnia 10/28/2017   Prediabetes 05/28/2017   QT prolongation 03/25/2017   Psoriasis 06/22/2015   COPD (chronic obstructive pulmonary disease) (HCC)GOLD E 05/18/2015   Anxiety and depression 07/10/2013   Essential hypertension 06/07/2013   Hyperlipidemia 06/07/2013   Asthma, chronic 06/07/2013   Emphysema lung (HCC) 06/07/2013   Chronic back pain 06/07/2013   CAD S/P percutaneous coronary angioplasty 06/07/2013   GERD (gastroesophageal reflux disease) 06/07/2013   Breast lump on left side at 3 o'clock position 10/13/2012   S/P abdominal hysterectomy and right salpingo-oophorectomy 08/29/2011   COPD exacerbation (HCC) 09/15/2009   TOBACCO ABUSE 07/17/2009   Chronic rhinitis 07/17/2009   Lung nodule < 6cm on CT 07/17/2009   Hypothyroidism 12/22/2008   Chronic depression 12/22/2008    Current Outpatient Medications:    albuterol  (VENTOLIN  HFA) 108 (90 Base) MCG/ACT inhaler, INHALE 2 PUFFS into THE lungs EVERY 6 HOURS AS NEEDED wheezing AND SHORTNESS OF BREATH, Disp: 54 g, Rfl: 1   alendronate  (FOSAMAX ) 70 MG tablet, Take 1 tablet (70 mg total) by mouth every 7 (seven) days. Take with a full glass of water on an empty  stomach., Disp: 12 tablet, Rfl: 1   aspirin  EC 81 MG tablet, Take 81 mg by mouth in the morning., Disp: , Rfl:    atorvastatin  (LIPITOR ) 80 MG tablet, Take 1 tablet (80 mg total) by mouth daily., Disp: 90 tablet, Rfl: 3   azithromycin  (ZITHROMAX ) 250 MG  tablet, Take 1 tablet (250 mg total) by mouth daily for 60 doses., Disp: 30 tablet, Rfl: 1   busPIRone  (BUSPAR ) 15 MG tablet, Take 2 tablets (30 mg total) by mouth in the morning AND 1 tablet (15 mg total) daily at 12 noon AND 1 tablet (15 mg total) every evening., Disp: 360 tablet, Rfl: 0   cephALEXin  (KEFLEX ) 500 MG capsule, Take 1 capsule (500 mg total) by mouth 2 (two) times daily for 7 days., Disp: 14 capsule, Rfl: 0   clonazePAM  (KLONOPIN ) 0.5 MG tablet, Take 1 tablet (0.5 mg total) by mouth 2 (two) times daily as needed., Disp: 60 tablet, Rfl: 0   doxepin  (SINEQUAN ) 75 MG capsule, Take 1 capsule (75 mg total) by mouth at bedtime., Disp: 90 capsule, Rfl: 0   escitalopram  (LEXAPRO ) 20 MG tablet, Take 1 tablet (20 mg total) by mouth daily., Disp: 90 tablet, Rfl: 0   ezetimibe  (ZETIA ) 10 MG tablet, Take 1 tablet (10 mg total) by mouth daily., Disp: 90 tablet, Rfl: 3   Fluticasone -Umeclidin-Vilant (TRELEGY ELLIPTA ) 200-62.5-25 MCG/ACT AEPB, Inhale 1 puff into the lungs daily., Disp: 60 each, Rfl: 11   furosemide  (LASIX ) 40 MG tablet, Take 1 tablet (40 mg total) by mouth daily., Disp: 90 tablet, Rfl: 1   gabapentin  (NEURONTIN ) 600 MG tablet, Take 1 tablet (600 mg total) by mouth 2 (two) times daily., Disp: 180 tablet, Rfl: 2   hydrOXYzine  (ATARAX ) 25 MG tablet, Take 1 tablet (25 mg total) by mouth at bedtime as needed., Disp: 30 tablet, Rfl: 2   ipratropium-albuterol  (DUONEB) 0.5-2.5 (3) MG/3ML SOLN, Take 3 mLs by nebulization every 8 (eight) hours., Disp: 90 mL, Rfl: 1   levothyroxine  (SYNTHROID ) 112 MCG tablet, Take 1 tablet (112 mcg total) by mouth daily., Disp: 90 tablet, Rfl: 2   metFORMIN  (GLUCOPHAGE ) 500 MG tablet, Take 1 tablet (500 mg total) by mouth 2 (two) times daily with a meal., Disp: 180 tablet, Rfl: 1   mirtazapine  (REMERON ) 45 MG tablet, Take 1 tablet (45 mg total) by mouth at bedtime., Disp: 90 tablet, Rfl: 0   montelukast  (SINGULAIR ) 10 MG tablet, Take 1 tablet (10 mg total) by  mouth at bedtime., Disp: 90 tablet, Rfl: 2   nebivolol  (BYSTOLIC ) 10 MG tablet, Take 1 tablet (10 mg total) by mouth daily., Disp: 90 tablet, Rfl: 3   nitroGLYCERIN  (NITROSTAT ) 0.4 MG SL tablet, Place 1 tablet (0.4 mg total) under the tongue every 5 (five) minutes as needed for chest pain., Disp: 25 tablet, Rfl: 3   omeprazole  (PRILOSEC) 40 MG capsule, Take 1 capsule (40 mg total) by mouth 2 (two) times daily before a meal., Disp: 60 capsule, Rfl: 1   OXYGEN , Inhale 2-3 L/min into the lungs continuous., Disp: , Rfl:    predniSONE  (DELTASONE ) 20 MG tablet, Take 1 tablet (20 mg total) by mouth daily with breakfast. (Patient not taking: Reported on 03/04/2024), Disp: 7 tablet, Rfl: 0   ticagrelor  (BRILINTA ) 60 MG TABS tablet, Take 1 tablet (60 mg total) by mouth 2 (two) times daily., Disp: 180 tablet, Rfl: 2   varenicline  (CHANTIX ) 1 MG tablet, Take 1 tablet (1 mg total) by mouth 2 (two) times daily., Disp:  60 tablet, Rfl: 3 Allergies  Allergen Reactions   Avocado Anaphylaxis   Latex Shortness Of Breath and Rash   Codeine  Nausea Only     Social History   Socioeconomic History   Marital status: Divorced    Spouse name: Not on file   Number of children: 1   Years of education: Not on file   Highest education level: 9th grade  Occupational History   Not on file  Tobacco Use   Smoking status: Every Day    Current packs/day: 0.50    Average packs/day: 0.5 packs/day for 50.0 years (25.0 ttl pk-yrs)    Types: Cigarettes   Smokeless tobacco: Never  Vaping Use   Vaping status: Some Days   Substances: Nicotine   Substance and Sexual Activity   Alcohol use: Yes    Comment: once a week   Drug use: Yes    Types: Marijuana    Comment: history of cocaine use, been about a year, per patient (12/18/21)   Sexual activity: Not Currently    Birth control/protection: Surgical  Other Topics Concern   Not on file  Social History Narrative   Not on file   Social Drivers of Health   Financial  Resource Strain: Medium Risk (08/07/2023)   Overall Financial Resource Strain (CARDIA)    Difficulty of Paying Living Expenses: Somewhat hard  Food Insecurity: No Food Insecurity (01/12/2024)   Hunger Vital Sign    Worried About Running Out of Food in the Last Year: Never true    Ran Out of Food in the Last Year: Never true  Transportation Needs: No Transportation Needs (01/12/2024)   PRAPARE - Administrator, Civil Service (Medical): No    Lack of Transportation (Non-Medical): No  Physical Activity: Inactive (08/07/2023)   Exercise Vital Sign    Days of Exercise per Week: 0 days    Minutes of Exercise per Session: 0 min  Stress: No Stress Concern Present (08/07/2023)   [employer] of Occupational Health - Occupational Stress Questionnaire    Feeling of Stress : Only a little  Social Connections: Moderately Integrated (01/09/2024)   Social Connection and Isolation Panel    Frequency of Communication with Friends and Family: More than three times a week    Frequency of Social Gatherings with Friends and Family: More than three times a week    Attends Religious Services: 1 to 4 times per year    Active Member of [employer] or Organizations: No    Attends Engineer, structural: 1 to 4 times per year    Marital Status: Never married  Recent Concern: Social Connections - Moderately Isolated (12/04/2023)   Social Connection and Isolation Panel    Frequency of Communication with Friends and Family: Twice a week    Frequency of Social Gatherings with Friends and Family: Never    Attends Religious Services: 1 to 4 times per year    Active Member of [employer] or Organizations: No    Attends Banker Meetings: 1 to 4 times per year    Marital Status: Never married  Intimate Partner Violence: Not At Risk (01/12/2024)   Humiliation, Afraid, Rape, and Kick questionnaire    Fear of Current or Ex-Partner: No    Emotionally Abused: No    Physically Abused: No    Sexually  Abused: No    Physical Exam      Future Appointments  Date Time Provider Department Center  03/12/2024  3:50 PM Vicci Sober  B, MD [facility] [address]  03/23/2024 12:30 PM [facility] None  03/23/2024  1:00 PM Onesimo Emaline Brink, MD [facility] None  04/09/2024  4:00 PM Collene Reginia BRAVO, PA [facility] None  04/13/2024  4:00 PM Kara Dorn NOVAK, MD [facility] [address]  05/24/2024  4:00 PM Kara Dorn NOVAK, MD [facility] [phone] LELON Das  06/22/2024  4:10 PM [facility] [place] VISIT [facility] [address]  07/13/2024  4:00 PM Floretta Mallard, MD [facility]

## 2024-03-12 ENCOUNTER — Other Ambulatory Visit (HOSPITAL_COMMUNITY): Payer: Self-pay

## 2024-03-12 ENCOUNTER — Encounter: Payer: Self-pay | Admitting: Internal Medicine

## 2024-03-12 ENCOUNTER — Ambulatory Visit: Payer: Self-pay | Attending: Internal Medicine | Admitting: Internal Medicine

## 2024-03-12 VITALS — BP 124/79 | HR 62 | Temp 98.2°F | Ht 61.0 in | Wt 161.0 lb

## 2024-03-12 DIAGNOSIS — Z9981 Dependence on supplemental oxygen: Secondary | ICD-10-CM

## 2024-03-12 DIAGNOSIS — Z23 Encounter for immunization: Secondary | ICD-10-CM

## 2024-03-12 DIAGNOSIS — Z7984 Long term (current) use of oral hypoglycemic drugs: Secondary | ICD-10-CM

## 2024-03-12 DIAGNOSIS — J9611 Chronic respiratory failure with hypoxia: Secondary | ICD-10-CM

## 2024-03-12 DIAGNOSIS — J449 Chronic obstructive pulmonary disease, unspecified: Secondary | ICD-10-CM

## 2024-03-12 DIAGNOSIS — I509 Heart failure, unspecified: Secondary | ICD-10-CM

## 2024-03-12 DIAGNOSIS — F17210 Nicotine dependence, cigarettes, uncomplicated: Secondary | ICD-10-CM | POA: Diagnosis not present

## 2024-03-12 DIAGNOSIS — F17200 Nicotine dependence, unspecified, uncomplicated: Secondary | ICD-10-CM

## 2024-03-12 DIAGNOSIS — E039 Hypothyroidism, unspecified: Secondary | ICD-10-CM | POA: Diagnosis not present

## 2024-03-12 DIAGNOSIS — J209 Acute bronchitis, unspecified: Secondary | ICD-10-CM

## 2024-03-12 DIAGNOSIS — E1169 Type 2 diabetes mellitus with other specified complication: Secondary | ICD-10-CM

## 2024-03-12 DIAGNOSIS — H6121 Impacted cerumen, right ear: Secondary | ICD-10-CM

## 2024-03-12 DIAGNOSIS — Z79899 Other long term (current) drug therapy: Secondary | ICD-10-CM

## 2024-03-12 LAB — POCT GLYCOSYLATED HEMOGLOBIN (HGB A1C): HbA1c, POC (controlled diabetic range): 6.7 % (ref 0.0–7.0)

## 2024-03-12 LAB — GLUCOSE, POCT (MANUAL RESULT ENTRY): POC Glucose: 121 mg/dL — AB (ref 70–99)

## 2024-03-12 MED ORDER — PREDNISONE 20 MG PO TABS
20.0000 mg | ORAL_TABLET | Freq: Every day | ORAL | 0 refills | Status: DC
Start: 2024-03-12 — End: 2024-04-13
  Filled 2024-03-12: qty 7, 7d supply, fill #0

## 2024-03-12 MED ORDER — METFORMIN HCL 500 MG PO TABS
500.0000 mg | ORAL_TABLET | Freq: Two times a day (BID) | ORAL | 1 refills | Status: DC
  Filled 2024-03-12 – 2024-05-04 (×2): qty 180, 90d supply, fill #0
  Filled 2024-08-02: qty 180, 90d supply, fill #1

## 2024-03-12 MED ORDER — AZITHROMYCIN 250 MG PO TABS
ORAL_TABLET | ORAL | 0 refills | Status: DC
  Filled 2024-03-12: qty 6, 5d supply, fill #0

## 2024-03-12 NOTE — Patient Instructions (Signed)
 VISIT SUMMARY:  Today, you were seen for a flare-up of your COPD symptoms, which included a wet cough, wheezing, and increased shortness of breath. We also discussed your heart failure, diabetes, hypothyroidism, smoking habits, ear issues, and a recent episode of confusion.  YOUR PLAN:  -CHRONIC OBSTRUCTIVE PULMONARY DISEASE (COPD) WITH ACUTE EXACERBATION AND CHRONIC RESPIRATORY FAILURE WITH HYPOXIA: COPD is a chronic lung disease that makes it hard to breathe. You are experiencing a flare-up of your symptoms. We have prescribed prednisone  and azithromycin  to help manage this exacerbation. You will also receive an influenza vaccine today.  -HEART FAILURE: Heart failure means your heart is not pumping blood as well as it should. You recently had a weight gain that was managed with an increased dose of your diuretic. Continue taking nebivolol  10 mg daily and furosemide  40 mg daily, and adjust the furosemide  dosage if you notice weight gain.  -TYPE 2 DIABETES MELLITUS: Type 2 diabetes affects how your body processes blood sugar. Your A1c is 6.7, which is within the target range. Continue taking metformin  and monitor your blood glucose levels, especially when using prednisone .  -HYPOTHYROIDISM: Hypothyroidism is when your thyroid  gland doesn't produce enough hormones. Your condition is well-managed with your current levothyroxine  regimen. Continue taking your medication as prescribed.  -NICOTINE  DEPENDENCE: Nicotine  dependence means you are addicted to smoking. You are currently smoking four cigarettes a day. We strongly encourage you to quit smoking to improve your respiratory health.  -IMPACTED CERUMEN, RIGHT EAR: Impacted cerumen means you have earwax buildup in your right ear, causing hearing difficulties. Use an over-the-counter wax softener for several days. If needed, schedule a nurse visit next week for ear irrigation.  -CHANGE IN MENTAL STATUS: You recently experienced confusion, which may  have been related to a urinary tract infection. Infections can cause changes in mental status in elderly patients. Monitor for any further changes in your mental status.  INSTRUCTIONS:  Please follow up with a nurse visit next week for ear irrigation if the wax softener does not resolve the issue. Continue monitoring your blood glucose levels, especially during prednisone  use. Adjust your furosemide  dosage if you notice any weight gain. Monitor for any further changes in your mental status.

## 2024-03-12 NOTE — Progress Notes (Signed)
 Patient ID: [name] [name], female    DOB: 1955-12-05  MRN: [id number]  CC: Follow-up (Follow-up./Cough, congestion, sneezing X2 days /Flu vax administered on 04/12/2024 - C.A.)   Subjective: [name] [name] is a 68 y.o. female who presents for chronic ds management. Her concerns today include:  Patient with history of DM (A1C 6.8 08/07/2023), GERD, hypothyroidism, HTN, HL, CAD with previous stent, CHF, COPD, chronic hypoxic resp failure on 2 L O2 continuous, substance abuse, depression/anxiety and tob dep, essential thrombocytosis, iron  deficiency anemia, Osteopenia with fragility rib fractures on Fosamax    Discussed the use of AI scribe software for clinical note transcription with the patient, who gave verbal consent to proceed.  History of Present Illness [name] [name] is a 68 year old female with COPD who presents with a flare-up of symptoms. Her daughter is with her.  COPD/Tob dep: She is experiencing a flare-up of her chronic obstructive pulmonary disease (COPD) symptoms, characterized by a wet cough with difficulty expectorating sputum, wheezing for the past two days, and a slight increase in shortness of breath. She also experienced a fever last night. She uses oxygen  at three liters continuously and utilizes her nebulizer with one medication in the morning and at night, and DuoNeb as needed, which she last used last night.  Using Trelegy inhaler daily as prescribed.  Exacerbations occur approximately every three weeks. She has reduced to four cigarettes a day.  DM:She has a history of diabetes, with her current A1c at 6.7. She is on metformin  500 mg twice a day and checks her blood sugars occasionally, noting variability depending on prednisone  use. She is frequently on prednisone .  HTN/CAD/CHF: Regarding her heart failure, she experienced weight gain two weeks ago, gaining six pounds in two days. Her daughter administered an extra dose of her diuretic Furosemide  for three days, which  resolved the issue. She is on nebivolol  10 mg daily and furosemide  40 mg daily, with adjustments as needed. No CP.  Hypothyroidism: Reports compliance with taking the levothyroxine  112 mcg daily.  She has been experiencing some memory issues, including an incident where she was confused about her location. This occurred during a week when she had a urinary tract infection (UTI), which may have contributed to her confusion.  She reports no recent ear cleaning and has noticed difficulty hearing out of RT ear.    Patient Active Problem List   Diagnosis Date Noted   Hypokalemia 01/08/2024   Acute on chronic heart failure with preserved ejection fraction (HFpEF, >= 50%) (HCC) 12/05/2023   At risk for polypharmacy 12/04/2023   Long-term current use of antidepressant 11/17/2023   Long-term current use of benzodiazepine 11/17/2023   Sigmoid diverticulitis 11/12/2023   Acute kidney injury superimposed on chronic kidney disease (HCC) 11/12/2023   CKD (chronic kidney disease) stage 2, GFR 60-89 ml/min 11/12/2023   Sepsis secondary to sigmoid diverticulitis (HCC) 11/12/2023   Acute on chronic hypoxic respiratory failure (HCC) 10/05/2023   Pneumothorax, right 09/16/2023   COPD with acute exacerbation (HCC) 09/01/2023   Chronic hypoxic respiratory failure (HCC) 09/01/2023   Continuous dependence on cigarette smoking 09/01/2023   History of CAD (coronary artery disease) 09/01/2023   CAP (community acquired pneumonia) 09/01/2023   Full code status 06/14/2023   Acute hypoxic respiratory failure (HCC) 06/13/2023   Acute exacerbation of chronic obstructive pulmonary disease (COPD) (HCC) 06/12/2023   Nicotine  dependence 05/03/2023   Substance abuse (HCC) 09/10/2022   Iron  deficiency anemia 11/02/2021   Major depressive disorder,  recurrent, in full remission with anxious distress (HCC) 05/31/2021   Chronic neck pain 02/26/2021   GAD (generalized anxiety disorder) 01/08/2021   Esophageal dysphagia  04/28/2019   Bipolar disorder (HCC) 09/17/2018   Insomnia 10/28/2017   Prediabetes 05/28/2017   QT prolongation 03/25/2017   Psoriasis 06/22/2015   COPD (chronic obstructive pulmonary disease) (HCC)GOLD E 05/18/2015   Anxiety and depression 07/10/2013   Essential hypertension 06/07/2013   Hyperlipidemia 06/07/2013   Asthma, chronic 06/07/2013   Emphysema lung (HCC) 06/07/2013   Chronic back pain 06/07/2013   CAD S/P percutaneous coronary angioplasty 06/07/2013   GERD (gastroesophageal reflux disease) 06/07/2013   Breast lump on left side at 3 o'clock position 10/13/2012   S/P abdominal hysterectomy and right salpingo-oophorectomy 08/29/2011   COPD exacerbation (HCC) 09/15/2009   TOBACCO ABUSE 07/17/2009   Chronic rhinitis 07/17/2009   Lung nodule < 6cm on CT 07/17/2009   Hypothyroidism 12/22/2008   Chronic depression 12/22/2008     Current Outpatient Medications on File Prior to Visit  Medication Sig Dispense Refill   albuterol  (VENTOLIN  HFA) 108 (90 Base) MCG/ACT inhaler INHALE 2 PUFFS into THE lungs EVERY 6 HOURS AS NEEDED wheezing AND SHORTNESS OF BREATH 54 g 1   alendronate  (FOSAMAX ) 70 MG tablet Take 1 tablet (70 mg total) by mouth every 7 (seven) days. Take with a full glass of water on an empty stomach. 12 tablet 1   aspirin  EC 81 MG tablet Take 81 mg by mouth in the morning.     atorvastatin  (LIPITOR ) 80 MG tablet Take 1 tablet (80 mg total) by mouth daily. 90 tablet 3   busPIRone  (BUSPAR ) 15 MG tablet Take 2 tablets (30 mg total) by mouth in the morning AND 1 tablet (15 mg total) daily at 12 noon AND 1 tablet (15 mg total) every evening. 360 tablet 0   clonazePAM  (KLONOPIN ) 0.5 MG tablet Take 1 tablet (0.5 mg total) by mouth 2 (two) times daily as needed. 60 tablet 0   doxepin  (SINEQUAN ) 75 MG capsule Take 1 capsule (75 mg total) by mouth at bedtime. 90 capsule 0   escitalopram  (LEXAPRO ) 20 MG tablet Take 1 tablet (20 mg total) by mouth daily. 90 tablet 0   ezetimibe   (ZETIA ) 10 MG tablet Take 1 tablet (10 mg total) by mouth daily. 90 tablet 3   Fluticasone -Umeclidin-Vilant (TRELEGY ELLIPTA ) 200-62.5-25 MCG/ACT AEPB Inhale 1 puff into the lungs daily. 60 each 11   furosemide  (LASIX ) 40 MG tablet Take 1 tablet (40 mg total) by mouth daily. 90 tablet 1   gabapentin  (NEURONTIN ) 600 MG tablet Take 1 tablet (600 mg total) by mouth 2 (two) times daily. 180 tablet 2   hydrOXYzine  (ATARAX ) 25 MG tablet Take 1 tablet (25 mg total) by mouth at bedtime as needed. 30 tablet 2   ipratropium-albuterol  (DUONEB) 0.5-2.5 (3) MG/3ML SOLN Take 3 mLs by nebulization every 8 (eight) hours. 90 mL 1   levothyroxine  (SYNTHROID ) 112 MCG tablet Take 1 tablet (112 mcg total) by mouth daily. 90 tablet 2   mirtazapine  (REMERON ) 45 MG tablet Take 1 tablet (45 mg total) by mouth at bedtime. 90 tablet 0   montelukast  (SINGULAIR ) 10 MG tablet Take 1 tablet (10 mg total) by mouth at bedtime. 90 tablet 2   nebivolol  (BYSTOLIC ) 10 MG tablet Take 1 tablet (10 mg total) by mouth daily. 90 tablet 3   nitroGLYCERIN  (NITROSTAT ) 0.4 MG SL tablet Place 1 tablet (0.4 mg total) under the tongue every 5 (five) minutes  as needed for chest pain. 25 tablet 3   omeprazole  (PRILOSEC) 40 MG capsule Take 1 capsule (40 mg total) by mouth 2 (two) times daily before a meal. 60 capsule 1   OXYGEN  Inhale 2-3 L/min into the lungs continuous.     ticagrelor  (BRILINTA ) 60 MG TABS tablet Take 1 tablet (60 mg total) by mouth 2 (two) times daily. 180 tablet 2   varenicline  (CHANTIX ) 1 MG tablet Take 1 tablet (1 mg total) by mouth 2 (two) times daily. 60 tablet 3   No current facility-administered medications on file prior to visit.    Allergies  Allergen Reactions   Avocado Anaphylaxis   Latex Shortness Of Breath and Rash   Codeine  Nausea Only    Social History   Socioeconomic History   Marital status: Divorced    Spouse name: Not on file   Number of children: 1   Years of education: Not on file   Highest  education level: 9th grade  Occupational History   Not on file  Tobacco Use   Smoking status: Every Day    Current packs/day: 0.50    Average packs/day: 0.5 packs/day for 50.0 years (25.0 ttl pk-yrs)    Types: Cigarettes   Smokeless tobacco: Never  Vaping Use   Vaping status: Some Days   Substances: Nicotine   Substance and Sexual Activity   Alcohol use: Yes    Comment: once a week   Drug use: Yes    Types: Marijuana    Comment: history of cocaine use, been about a year, per patient (12/18/21)   Sexual activity: Not Currently    Birth control/protection: Surgical  Other Topics Concern   Not on file  Social History Narrative   Not on file   Social Drivers of Health   Financial Resource Strain: Medium Risk (08/07/2023)   Overall Financial Resource Strain (CARDIA)    Difficulty of Paying Living Expenses: Somewhat hard  Food Insecurity: No Food Insecurity (01/12/2024)   Hunger Vital Sign    Worried About Running Out of Food in the Last Year: Never true    Ran Out of Food in the Last Year: Never true  Transportation Needs: No Transportation Needs (01/12/2024)   PRAPARE - Administrator, Civil Service (Medical): No    Lack of Transportation (Non-Medical): No  Physical Activity: Inactive (08/07/2023)   Exercise Vital Sign    Days of Exercise per Week: 0 days    Minutes of Exercise per Session: 0 min  Stress: No Stress Concern Present (08/07/2023)   [employer] of Occupational Health - Occupational Stress Questionnaire    Feeling of Stress : Only a little  Social Connections: Moderately Integrated (01/09/2024)   Social Connection and Isolation Panel    Frequency of Communication with Friends and Family: More than three times a week    Frequency of Social Gatherings with Friends and Family: More than three times a week    Attends Religious Services: 1 to 4 times per year    Active Member of [employer] or Organizations: No    Attends Engineer, structural: 1 to 4  times per year    Marital Status: Never married  Recent Concern: Social Connections - Moderately Isolated (12/04/2023)   Social Connection and Isolation Panel    Frequency of Communication with Friends and Family: Twice a week    Frequency of Social Gatherings with Friends and Family: Never    Attends Religious Services: 1 to 4 times per year  Active Member of Clubs or Organizations: No    Attends Banker Meetings: 1 to 4 times per year    Marital Status: Never married  Intimate Partner Violence: Not At Risk (01/12/2024)   Humiliation, Afraid, Rape, and Kick questionnaire    Fear of Current or Ex-Partner: No    Emotionally Abused: No    Physically Abused: No    Sexually Abused: No    Family History  Problem Relation Age of Onset   Heart disease Mother    Hypertension Mother    Stroke Mother    Mental illness Mother    Heart disease Father    Hypertension Father    Diabetes Father    Colon polyps Brother    Heart disease Maternal Grandfather    Cancer Paternal Grandmother        [place]   Anesthesia problems Daughter    Breast cancer Maternal Aunt    Throat cancer Maternal Uncle    Thyroid  disease Paternal Aunt    Hypotension Neg Hx    Malignant hyperthermia Neg Hx    Pseudochol deficiency Neg Hx    Colon cancer Neg Hx    Stomach cancer Neg Hx    Esophageal cancer Neg Hx    Pancreatic cancer Neg Hx    Rectal cancer Neg Hx     Past Surgical History:  Procedure Laterality Date   ABDOMINAL EXPLORATION SURGERY  1977   ABDOMINAL HYSTERECTOMY  1977   left one of my ovaries   BACK SURGERY     BIOPSY  05/27/2019   Procedure: BIOPSY;  Surgeon: Shaaron Lamar HERO, MD;  Location: [facility];  Service: Endoscopy;;   BREAST BIOPSY Left 11/2021   x2   COLONOSCOPY  2011   Dr. Teressa: Mild diverticulosis, descending diminutive colon polyp (not retrieved), next colonoscopy 10 years   CORONARY ANGIOPLASTY WITH STENT PLACEMENT  2011 X2   regular stents didn't work;  had to go back in in ~ 1 month and put in medicated stents   DILATION AND CURETTAGE OF UTERUS     ESOPHAGOGASTRODUODENOSCOPY     ESOPHAGOGASTRODUODENOSCOPY (EGD) WITH PROPOFOL  N/A 05/27/2019   normal esophagus, dilation, erosive gastropathy s/p biopsy, normal duodenum. Negative H.pylori.    LEFT HEART CATH AND CORONARY ANGIOGRAPHY N/A 03/26/2017   Procedure: LEFT HEART CATH AND CORONARY ANGIOGRAPHY;  Surgeon: Claudene Victory ORN, MD;  Location: [facility];  Service: Cardiovascular;  Laterality: N/A;   LUMBAR LAMINECTOMY/DECOMPRESSION MICRODISCECTOMY  08/05/2011   Procedure: LUMBAR LAMINECTOMY/DECOMPRESSION MICRODISCECTOMY;  Surgeon: Lynwood JONELLE Mill, MD;  Location: [facility];  Service: Neurosurgery;  Laterality: Right;  Right Lumbar four-five extraforaminal discectomy   MALONEY DILATION N/A 05/27/2019   Procedure: AGAPITO DILATION;  Surgeon: Shaaron Lamar HERO, MD;  Location: [facility];  Service: Endoscopy;  Laterality: N/A;  54   SHOULDER ARTHROSCOPY WITH ROTATOR CUFF REPAIR AND OPEN BICEPS TENODESIS Right 12/24/2021   Procedure: RIGHT SHOULDER ARTHROSCOPY WITH ROTATOR CUFF REPAIR AND BICEPS TENODESIS;  Surgeon: Barbarann Oneil BROCKS, MD;  Location: [facility];  Service: Orthopedics;  Laterality: Right;   TONSILLECTOMY     as a child   UPPER GASTROINTESTINAL ENDOSCOPY      ROS: Review of Systems Negative except as stated above  PHYSICAL EXAM: BP 124/79   Pulse 62   Temp 98.2 F (36.8 C) (Oral)   Ht 5' 1 (1.549 m)   Wt 161 lb (73 kg)   SpO2 98%   BMI 30.42 kg/m  Physical Exam Pox 98% on 3L General appearance -older Caucasian female NAD. Mental status - normal mood, behavior, speech, dress, motor activity, and thought processes Ears: Moderate hard wax noted in the right ear canal obscuring view of most of the canal and tympanic membrane.  Left ear canal and tympanic membrane within normal limits Chest -breath sounds are moderately decreased bilaterally.  No wheezes heard.  She has  intermittent wet nonproductive cough presents. Heart - normal rate, regular rhythm, normal S1, S2, no murmurs, rubs, clicks or gallops Extremities - no LE edema      Latest Ref Rng & Units 01/12/2024   11:25 AM 01/11/2024    9:29 AM 01/09/2024    5:32 AM  CMP  Glucose 70 - 99 mg/dL 851  876  763   BUN 8 - 23 mg/dL 18  18  14    Creatinine 0.44 - 1.00 mg/dL 9.16  9.25  9.12   Sodium 135 - 145 mmol/L 137  138  139   Potassium 3.5 - 5.1 mmol/L 4.0  3.6  3.4   Chloride 98 - 111 mmol/L 91  95  96   CO2 22 - 32 mmol/L 31  31  30    Calcium  8.9 - 10.3 mg/dL 89.5  9.4  9.5   Total Protein 6.5 - 8.1 g/dL   6.2   Total Bilirubin 0.0 - 1.2 mg/dL   0.2   Alkaline Phos 38 - 126 U/L   67   AST 15 - 41 U/L   19   ALT 0 - 44 U/L   16    Lipid Panel     Component Value Date/Time   CHOL 145 12/26/2023 1633   TRIG 100 12/26/2023 1633   HDL 79 12/26/2023 1633   CHOLHDL 1.8 12/26/2023 1633   CHOLHDL 3.8 04/16/2016 0934   VLDL 15 04/16/2016 0934   LDLCALC 48 12/26/2023 1633    CBC    Component Value Date/Time   WBC 22.6 (H) 01/12/2024 1125   RBC 4.18 01/12/2024 1125   HGB 12.8 01/12/2024 1125   HGB 14.8 05/20/2023 1119   HCT 39.7 01/12/2024 1125   HCT 44.2 05/20/2023 1119   PLT 561 (H) 01/12/2024 1125   PLT 373 05/20/2023 1119   MCV 95.0 01/12/2024 1125   MCV 99 (H) 05/20/2023 1119   MCH 30.6 01/12/2024 1125   MCHC 32.2 01/12/2024 1125   RDW 14.0 01/12/2024 1125   RDW 13.2 05/20/2023 1119   LYMPHSABS 2.3 01/08/2024 2027   LYMPHSABS 2.6 04/05/2019 0926   MONOABS 1.1 (H) 01/08/2024 2027   EOSABS 0.0 01/08/2024 2027   EOSABS 0.3 04/05/2019 0926   BASOSABS 0.0 01/08/2024 2027   BASOSABS 0.1 04/05/2019 0926    ASSESSMENT AND PLAN: 1. COPD, severe (HCC) (Primary) Patient having an exacerbation.  Course of prednisone  and Zithromax  - predniSONE  (DELTASONE ) 20 MG tablet; Take 1 tablet (20 mg total) by mouth daily with breakfast.  Dispense: 7 tablet; Refill: 0 - azithromycin  (ZITHROMAX   Z-PAK) 250 MG tablet; Take 2 tablets (500 mg total) by mouth daily for 1 day, THEN 1 tablet (250 mg total) daily for 4 days.  Dispense: 6 tablet; Refill: 0  2. Acute bronchitis, unspecified organism See #1 above  3. Chronic hypoxic respiratory failure, on home oxygen  therapy (HCC) Continue 3 liters O2  4. Type 2 diabetes mellitus with other specified complication, without long-term current use of insulin  (HCC) At goal. Continue Metformin  500 mg BID - POCT glycosylated hemoglobin (Hb A1C) -  POCT glucose (manual entry) - metFORMIN  (GLUCOPHAGE ) 500 MG tablet; Take 1 tablet (500 mg total) by mouth 2 (two) times daily with a meal.  Dispense: 180 tablet; Refill: 1  5. Chronic heart failure, unspecified heart failure type (HCC) Stable. Continue Furosemide  40 mg daily  6. Tobacco use disorder Continue to encourage her to try to quit smoking.  7. Acquired hypothyroidism Continue levothyroxine  112 mcg daily.  TSH was normal 3 months ago.  8. Impacted cerumen of right ear Recommend purchasing wax softener over-the-counter and using it in the ear for several days.  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 Need for influenza vaccination Given today.  In regards to memory changes associated with UTI infection at the time, I told her daughter to lets monitor this for now.  Elderly patients can develop mental status changes which can be mild or severe during any type of infection.  Patient was given the opportunity to ask questions.  Patient verbalized understanding of the plan and was able to repeat key elements of the plan.   This documentation was completed using Paediatric nurse.  Any transcriptional errors are unintentional.  Orders Placed This Encounter  Procedures   Flu vaccine HIGH DOSE PF(Fluzone Trivalent)   POCT glycosylated hemoglobin (Hb A1C)   POCT glucose (manual entry)     Requested Prescriptions   Signed Prescriptions  Disp Refills   metFORMIN  (GLUCOPHAGE ) 500 MG tablet 180 tablet 1    Sig: Take 1 tablet (500 mg total) by mouth 2 (two) times daily with a meal.   predniSONE  (DELTASONE ) 20 MG tablet 7 tablet 0    Sig: Take 1 tablet (20 mg total) by mouth daily with breakfast.   azithromycin  (ZITHROMAX  Z-PAK) 250 MG tablet 6 tablet 0    Sig: Take 2 tablets (500 mg total) by mouth daily for 1 day, THEN 1 tablet (250 mg total) daily for 4 days.    Return in about 3 months (around 06/12/2024).  Barnie Louder, MD, FACP

## 2024-03-13 ENCOUNTER — Other Ambulatory Visit: Payer: Self-pay | Admitting: Internal Medicine

## 2024-03-13 DIAGNOSIS — J449 Chronic obstructive pulmonary disease, unspecified: Secondary | ICD-10-CM

## 2024-03-15 NOTE — Telephone Encounter (Signed)
 Requested medication (s) are due for refill today: Yes  Requested medication (s) are on the active medication list: Yes  Last refill:  03/12/24  Future visit scheduled: No  Notes to clinic: Unable to refill due to no refill protocol for this medication.      Requested Prescriptions  Pending Prescriptions Disp Refills   azithromycin  (ZITHROMAX  Z-PAK) 250 MG tablet 6 tablet 0    Sig: Take 2 tablets (500 mg total) by mouth daily for 1 day, THEN 1 tablet (250 mg total) daily for 4 days.     Off-Protocol Failed - 03/15/2024 10:28 AM      Failed - Medication not assigned to a protocol, review manually.      Passed - Valid encounter within last 12 months    Recent Outpatient Visits           3 days ago COPD, severe (HCC)   [facility] [place]. [facility] Barnie NOVAK, MD   1 month ago Hospital discharge follow-up   [facility] Celestia Rosaline SQUIBB, NP   3 months ago Hospital discharge follow-up   [facility] Dept Of [place]. [facility] Barnie B, MD   4 months ago Right lower quadrant abdominal pain   [facility] Dept Of [place]. [facility] Barnie NOVAK, MD   5 months ago Hospital discharge follow-up   [facility] - [facility] Dept Of [place]. [facility] Barnie NOVAK, MD       Future Appointments             In 4 months Floretta Mallard, MD [facility] Dept of [facility] [place]. [facility] [employer], [facility]

## 2024-03-16 ENCOUNTER — Other Ambulatory Visit (HOSPITAL_COMMUNITY): Payer: Self-pay

## 2024-03-17 ENCOUNTER — Other Ambulatory Visit: Payer: Self-pay

## 2024-03-17 DIAGNOSIS — I2510 Atherosclerotic heart disease of native coronary artery without angina pectoris: Secondary | ICD-10-CM | POA: Diagnosis not present

## 2024-03-17 DIAGNOSIS — F411 Generalized anxiety disorder: Secondary | ICD-10-CM | POA: Diagnosis not present

## 2024-03-17 DIAGNOSIS — F32A Depression, unspecified: Secondary | ICD-10-CM | POA: Diagnosis not present

## 2024-03-17 DIAGNOSIS — E1122 Type 2 diabetes mellitus with diabetic chronic kidney disease: Secondary | ICD-10-CM | POA: Diagnosis not present

## 2024-03-17 DIAGNOSIS — I130 Hypertensive heart and chronic kidney disease with heart failure and stage 1 through stage 4 chronic kidney disease, or unspecified chronic kidney disease: Secondary | ICD-10-CM | POA: Diagnosis not present

## 2024-03-17 DIAGNOSIS — J441 Chronic obstructive pulmonary disease with (acute) exacerbation: Secondary | ICD-10-CM | POA: Diagnosis not present

## 2024-03-17 DIAGNOSIS — N189 Chronic kidney disease, unspecified: Secondary | ICD-10-CM | POA: Diagnosis not present

## 2024-03-17 DIAGNOSIS — D649 Anemia, unspecified: Secondary | ICD-10-CM | POA: Diagnosis not present

## 2024-03-17 DIAGNOSIS — I5033 Acute on chronic diastolic (congestive) heart failure: Secondary | ICD-10-CM | POA: Diagnosis not present

## 2024-03-18 ENCOUNTER — Other Ambulatory Visit (HOSPITAL_COMMUNITY): Payer: Self-pay | Admitting: Emergency Medicine

## 2024-03-18 NOTE — Progress Notes (Signed)
 Paramedicine Encounter    Patient ID: [name] [name], female    DOB: 02/20/1956, 68 y.o.   MRN: [id number]   Complaints Sinus' pressure RT eye.  Assessment A&O x 4, skin W&D w/ good color.  Denies chest pain or SOB.  Lung sounds clear and equal bilat w/ no peripheral edema noted    Compliance with meds YES  Pill box filled - filled by daughter  Refills needed NONE  Meds changes since last visit NONE    Social changes NONE   BP (!) 158/72 (BP Location: Left Arm, Patient Position: Sitting, Cuff Size: Normal)   Pulse 78   Resp 16   Wt 158 lb (71.7 kg)   SpO2 94%   BMI 29.85 kg/m  Weight yesterday- not taken Last visit weight-161lb  ATF Ms. Royle sitting outside on her porch area of her apartment A&O x 4, skin W&D w/ good color.  Pt's BP is elevatedShe states she had a disagreement with her daughter last night that has continued into today.  She states she has felt  extremely nervous since then. Encouraged her to try and relax and her nervousness should subside. Pill box reviewed and is in order.   Ms Dyck continues to wean herself off cigarettes.  She is down to 3 or 4 cigarettes a day.   Reviewed pending appointments and she is aware of same. Next home visit 9/11@ 11:30  ACTION: Home visit completed  [name] [phone] 03/18/24  Patient Care Team: Vicci Barnie [name], MD as PCP - General (Internal Medicine) Burnard Debby LABOR, MD (Inactive) as PCP - Cardiology (Cardiology) Shaaron Lamar HERO, MD as Consulting Physician (Gastroenterology) Onesimo Emaline Brink, MD as Consulting Physician (Hematology) Collene Reginia BRAVO, [place] as Physician Assistant (Physician Assistant) Carolee Heron [name], RN as Case Manager  Patient Active Problem List   Diagnosis Date Noted   Hypokalemia 01/08/2024   Acute on chronic heart failure with preserved ejection fraction (HFpEF, >= 50%) (HCC) 12/05/2023   At risk for polypharmacy 12/04/2023   Long-term current use of antidepressant  11/17/2023   Long-term current use of benzodiazepine 11/17/2023   Sigmoid diverticulitis 11/12/2023   Acute kidney injury superimposed on chronic kidney disease (HCC) 11/12/2023   CKD (chronic kidney disease) stage 2, GFR 60-89 ml/min 11/12/2023   Sepsis secondary to sigmoid diverticulitis (HCC) 11/12/2023   Acute on chronic hypoxic respiratory failure (HCC) 10/05/2023   Pneumothorax, right 09/16/2023   COPD with acute exacerbation (HCC) 09/01/2023   Chronic hypoxic respiratory failure (HCC) 09/01/2023   Continuous dependence on cigarette smoking 09/01/2023   History of CAD (coronary artery disease) 09/01/2023   CAP (community acquired pneumonia) 09/01/2023   Full code status 06/14/2023   Acute hypoxic respiratory failure (HCC) 06/13/2023   Acute exacerbation of chronic obstructive pulmonary disease (COPD) (HCC) 06/12/2023   Nicotine  dependence 05/03/2023   Substance abuse (HCC) 09/10/2022   Iron  deficiency anemia 11/02/2021   Major depressive disorder, recurrent, in full remission with anxious distress (HCC) 05/31/2021   Chronic neck pain 02/26/2021   GAD (generalized anxiety disorder) 01/08/2021   Esophageal dysphagia 04/28/2019   Bipolar disorder (HCC) 09/17/2018   Insomnia 10/28/2017   Prediabetes 05/28/2017   QT prolongation 03/25/2017   Psoriasis 06/22/2015   COPD (chronic obstructive pulmonary disease) (HCC)GOLD E 05/18/2015   Anxiety and depression 07/10/2013   Essential hypertension 06/07/2013   Hyperlipidemia 06/07/2013   Asthma, chronic 06/07/2013   Emphysema lung (HCC) 06/07/2013   Chronic back pain 06/07/2013   CAD  S/P percutaneous coronary angioplasty 06/07/2013   GERD (gastroesophageal reflux disease) 06/07/2013   Breast lump on left side at 3 o'clock position 10/13/2012   S/P abdominal hysterectomy and right salpingo-oophorectomy 08/29/2011   COPD exacerbation (HCC) 09/15/2009   TOBACCO ABUSE 07/17/2009   Chronic rhinitis 07/17/2009   Lung nodule < 6cm on CT  07/17/2009   Hypothyroidism 12/22/2008   Chronic depression 12/22/2008    Current Outpatient Medications:    albuterol  (VENTOLIN  HFA) 108 (90 Base) MCG/ACT inhaler, INHALE 2 PUFFS into THE lungs EVERY 6 HOURS AS NEEDED wheezing AND SHORTNESS OF BREATH, Disp: 54 g, Rfl: 1   alendronate  (FOSAMAX ) 70 MG tablet, Take 1 tablet (70 mg total) by mouth every 7 (seven) days. Take with a full glass of water on an empty stomach., Disp: 12 tablet, Rfl: 1   aspirin  EC 81 MG tablet, Take 81 mg by mouth in the morning., Disp: , Rfl:    atorvastatin  (LIPITOR ) 80 MG tablet, Take 1 tablet (80 mg total) by mouth daily., Disp: 90 tablet, Rfl: 3   azithromycin  (ZITHROMAX  Z-PAK) 250 MG tablet, Take 2 tablets (500 mg total) by mouth daily for 1 day, THEN 1 tablet (250 mg total) daily for 4 days., Disp: 6 tablet, Rfl: 0   busPIRone  (BUSPAR ) 15 MG tablet, Take 2 tablets (30 mg total) by mouth in the morning AND 1 tablet (15 mg total) daily at 12 noon AND 1 tablet (15 mg total) every evening., Disp: 360 tablet, Rfl: 0   clonazePAM  (KLONOPIN ) 0.5 MG tablet, Take 1 tablet (0.5 mg total) by mouth 2 (two) times daily as needed., Disp: 60 tablet, Rfl: 0   doxepin  (SINEQUAN ) 75 MG capsule, Take 1 capsule (75 mg total) by mouth at bedtime., Disp: 90 capsule, Rfl: 0   escitalopram  (LEXAPRO ) 20 MG tablet, Take 1 tablet (20 mg total) by mouth daily., Disp: 90 tablet, Rfl: 0   ezetimibe  (ZETIA ) 10 MG tablet, Take 1 tablet (10 mg total) by mouth daily., Disp: 90 tablet, Rfl: 3   Fluticasone -Umeclidin-Vilant (TRELEGY ELLIPTA ) 200-62.5-25 MCG/ACT AEPB, Inhale 1 puff into the lungs daily., Disp: 60 each, Rfl: 11   furosemide  (LASIX ) 40 MG tablet, Take 1 tablet (40 mg total) by mouth daily., Disp: 90 tablet, Rfl: 1   gabapentin  (NEURONTIN ) 600 MG tablet, Take 1 tablet (600 mg total) by mouth 2 (two) times daily., Disp: 180 tablet, Rfl: 2   hydrOXYzine  (ATARAX ) 25 MG tablet, Take 1 tablet (25 mg total) by mouth at bedtime as needed., Disp:  30 tablet, Rfl: 2   ipratropium-albuterol  (DUONEB) 0.5-2.5 (3) MG/3ML SOLN, Take 3 mLs by nebulization every 8 (eight) hours., Disp: 90 mL, Rfl: 1   levothyroxine  (SYNTHROID ) 112 MCG tablet, Take 1 tablet (112 mcg total) by mouth daily., Disp: 90 tablet, Rfl: 2   metFORMIN  (GLUCOPHAGE ) 500 MG tablet, Take 1 tablet (500 mg total) by mouth 2 (two) times daily with a meal., Disp: 180 tablet, Rfl: 1   mirtazapine  (REMERON ) 45 MG tablet, Take 1 tablet (45 mg total) by mouth at bedtime., Disp: 90 tablet, Rfl: 0   montelukast  (SINGULAIR ) 10 MG tablet, Take 1 tablet (10 mg total) by mouth at bedtime., Disp: 90 tablet, Rfl: 2   nebivolol  (BYSTOLIC ) 10 MG tablet, Take 1 tablet (10 mg total) by mouth daily., Disp: 90 tablet, Rfl: 3   nitroGLYCERIN  (NITROSTAT ) 0.4 MG SL tablet, Place 1 tablet (0.4 mg total) under the tongue every 5 (five) minutes as needed for chest pain., Disp: 25  tablet, Rfl: 3   omeprazole  (PRILOSEC) 40 MG capsule, Take 1 capsule (40 mg total) by mouth 2 (two) times daily before a meal., Disp: 60 capsule, Rfl: 1   OXYGEN , Inhale 2-3 L/min into the lungs continuous., Disp: , Rfl:    predniSONE  (DELTASONE ) 20 MG tablet, Take 1 tablet (20 mg total) by mouth daily with breakfast., Disp: 7 tablet, Rfl: 0   ticagrelor  (BRILINTA ) 60 MG TABS tablet, Take 1 tablet (60 mg total) by mouth 2 (two) times daily., Disp: 180 tablet, Rfl: 2   varenicline  (CHANTIX ) 1 MG tablet, Take 1 tablet (1 mg total) by mouth 2 (two) times daily., Disp: 60 tablet, Rfl: 3 Allergies  Allergen Reactions   Avocado Anaphylaxis   Latex Shortness Of Breath and Rash   Codeine  Nausea Only     Social History   Socioeconomic History   Marital status: Divorced    Spouse name: Not on file   Number of children: 1   Years of education: Not on file   Highest education level: 9th grade  Occupational History   Not on file  Tobacco Use   Smoking status: Every Day    Current packs/day: 0.50    Average packs/day: 0.5  packs/day for 50.0 years (25.0 ttl pk-yrs)    Types: Cigarettes   Smokeless tobacco: Never  Vaping Use   Vaping status: Some Days   Substances: Nicotine   Substance and Sexual Activity   Alcohol use: Yes    Comment: once a week   Drug use: Yes    Types: Marijuana    Comment: history of cocaine use, been about a year, per patient (12/18/21)   Sexual activity: Not Currently    Birth control/protection: Surgical  Other Topics Concern   Not on file  Social History Narrative   Not on file   Social Drivers of Health   Financial Resource Strain: Medium Risk (08/07/2023)   Overall Financial Resource Strain (CARDIA)    Difficulty of Paying Living Expenses: Somewhat hard  Food Insecurity: No Food Insecurity (01/12/2024)   Hunger Vital Sign    Worried About Running Out of Food in the Last Year: Never true    Ran Out of Food in the Last Year: Never true  Transportation Needs: No Transportation Needs (01/12/2024)   PRAPARE - Administrator, Civil Service (Medical): No    Lack of Transportation (Non-Medical): No  Physical Activity: Inactive (08/07/2023)   Exercise Vital Sign    Days of Exercise per Week: 0 days    Minutes of Exercise per Session: 0 min  Stress: No Stress Concern Present (08/07/2023)   [employer] of Occupational Health - Occupational Stress Questionnaire    Feeling of Stress : Only a little  Social Connections: Moderately Integrated (01/09/2024)   Social Connection and Isolation Panel    Frequency of Communication with Friends and Family: More than three times a week    Frequency of Social Gatherings with Friends and Family: More than three times a week    Attends Religious Services: 1 to 4 times per year    Active Member of [employer] or Organizations: No    Attends Engineer, structural: 1 to 4 times per year    Marital Status: Never married  Recent Concern: Social Connections - Moderately Isolated (12/04/2023)   Social Connection and Isolation Panel     Frequency of Communication with Friends and Family: Twice a week    Frequency of Social Gatherings with Friends and Family:  Never    Attends Religious Services: 1 to 4 times per year    Active Member of Clubs or Organizations: No    Attends Banker Meetings: 1 to 4 times per year    Marital Status: Never married  Intimate Partner Violence: Not At Risk (01/12/2024)   Humiliation, Afraid, Rape, and Kick questionnaire    Fear of Current or Ex-Partner: No    Emotionally Abused: No    Physically Abused: No    Sexually Abused: No    Physical Exam      Future Appointments  Date Time Provider Department Center  03/23/2024 12:30 PM [facility] None  03/23/2024  1:00 PM Onesimo Emaline Brink, MD [facility] None  04/09/2024  4:00 PM Collene Reginia BRAVO, PA [facility] None  04/13/2024  4:00 PM Kara Dorn NOVAK, MD [facility] [address]  05/24/2024  4:00 PM Kara Dorn NOVAK, MD [facility] [address]  06/21/2024  4:10 PM Vicci Barnie NOVAK, MD [facility] [address]  06/22/2024  4:10 PM [facility] [place] VISIT [facility] [address]  07/13/2024  4:00 PM Floretta Mallard, MD [facility]

## 2024-03-22 ENCOUNTER — Other Ambulatory Visit (HOSPITAL_COMMUNITY): Payer: Self-pay | Admitting: Physician Assistant

## 2024-03-22 ENCOUNTER — Other Ambulatory Visit: Payer: Self-pay

## 2024-03-22 DIAGNOSIS — F411 Generalized anxiety disorder: Secondary | ICD-10-CM

## 2024-03-22 DIAGNOSIS — D509 Iron deficiency anemia, unspecified: Secondary | ICD-10-CM

## 2024-03-22 DIAGNOSIS — D75839 Thrombocytosis, unspecified: Secondary | ICD-10-CM

## 2024-03-23 ENCOUNTER — Inpatient Hospital Stay: Payer: Medicare HMO

## 2024-03-23 ENCOUNTER — Inpatient Hospital Stay: Payer: Medicare HMO | Admitting: Hematology

## 2024-03-24 ENCOUNTER — Other Ambulatory Visit (HOSPITAL_COMMUNITY): Payer: Self-pay | Admitting: Physician Assistant

## 2024-03-24 ENCOUNTER — Other Ambulatory Visit (HOSPITAL_COMMUNITY): Payer: Self-pay

## 2024-03-24 ENCOUNTER — Other Ambulatory Visit: Payer: Self-pay | Admitting: Pulmonary Disease

## 2024-03-24 DIAGNOSIS — F411 Generalized anxiety disorder: Secondary | ICD-10-CM

## 2024-03-25 ENCOUNTER — Other Ambulatory Visit (HOSPITAL_COMMUNITY): Payer: Self-pay

## 2024-03-25 ENCOUNTER — Other Ambulatory Visit (HOSPITAL_COMMUNITY): Payer: Self-pay | Admitting: Emergency Medicine

## 2024-03-25 DIAGNOSIS — J441 Chronic obstructive pulmonary disease with (acute) exacerbation: Secondary | ICD-10-CM | POA: Diagnosis not present

## 2024-03-25 DIAGNOSIS — F32A Depression, unspecified: Secondary | ICD-10-CM | POA: Diagnosis not present

## 2024-03-25 DIAGNOSIS — I2510 Atherosclerotic heart disease of native coronary artery without angina pectoris: Secondary | ICD-10-CM | POA: Diagnosis not present

## 2024-03-25 DIAGNOSIS — N189 Chronic kidney disease, unspecified: Secondary | ICD-10-CM | POA: Diagnosis not present

## 2024-03-25 DIAGNOSIS — F411 Generalized anxiety disorder: Secondary | ICD-10-CM | POA: Diagnosis not present

## 2024-03-25 DIAGNOSIS — I130 Hypertensive heart and chronic kidney disease with heart failure and stage 1 through stage 4 chronic kidney disease, or unspecified chronic kidney disease: Secondary | ICD-10-CM | POA: Diagnosis not present

## 2024-03-25 DIAGNOSIS — I5033 Acute on chronic diastolic (congestive) heart failure: Secondary | ICD-10-CM | POA: Diagnosis not present

## 2024-03-25 DIAGNOSIS — D649 Anemia, unspecified: Secondary | ICD-10-CM | POA: Diagnosis not present

## 2024-03-25 DIAGNOSIS — E1122 Type 2 diabetes mellitus with diabetic chronic kidney disease: Secondary | ICD-10-CM | POA: Diagnosis not present

## 2024-03-25 NOTE — Progress Notes (Signed)
 Paramedicine Encounter    Patient ID: [name] [name], female    DOB: [birth year], 68 y.o.   MRN: [id number]   Complaints Cold symptoms.  Cold symptoms, coughing, sore throat, runny nose. Afebrile  Assessment A&O x 4, skin W&D w/ good color.  Pt states she has a mildly productive cough.  She is SOB per baseline.  Lung sounds with expiratory wheezing bilat.  Room air SpO2 85%.  Afebrile, denies chest pain.  Compliance with meds YES  Pill box filled by daughter  Refills needed NONE  Meds changes since last visit NONE    Social changes NONE   BP 118/70 (BP Location: Left Arm, Patient Position: Sitting, Cuff Size: Normal)   Pulse 68   Resp 20   SpO2 (!) 85%  Weight yesterday- Last visit weight-158lb  ATF [name] sitting of her front porch smoking a cigarette w/ her nasal cannula sitting in her lap.  She states she has just gotten up.  She hasn't had breakfast nor has she taken any of her morning meds.  She extinguished her cigarette and we continued in the house to complete the visit.   Pt has a junky cough and audible wheezes.  SpO2 85%.  She denies fever.  Had pt do a breathing treatment while I was there and reassessed her lung sounds diminished wheezing w/ increased SpO2 90%.  Encouraged pt repeat her breathing treatments as prescribed.  Advised her to reach out to her PCP and schedule a visit for the cough and cold symptoms.  I will check on her tomorrow at 1:00 to reassess breathing and lung sounds.  ACTION: Home visit completed  [name] [phone] 03/25/24  Patient Care Team: Vicci Barnie [name], MD as PCP - General (Internal Medicine) Burnard Debby LABOR, MD (Inactive) as PCP - Cardiology (Cardiology) Shaaron Lamar HERO, MD as Consulting Physician (Gastroenterology) Onesimo Emaline Brink, MD as Consulting Physician (Hematology) Collene Reginia BRAVO, [place] as Physician Assistant (Physician Assistant) Carolee Heron [name], RN as Case Manager  Patient Active Problem List    Diagnosis Date Noted   Hypokalemia 01/08/2024   Acute on chronic heart failure with preserved ejection fraction (HFpEF, >= 50%) (HCC) 12/05/2023   At risk for polypharmacy 12/04/2023   Long-term current use of antidepressant 11/17/2023   Long-term current use of benzodiazepine 11/17/2023   Sigmoid diverticulitis 11/12/2023   Acute kidney injury superimposed on chronic kidney disease (HCC) 11/12/2023   CKD (chronic kidney disease) stage 2, GFR 60-89 ml/min 11/12/2023   Sepsis secondary to sigmoid diverticulitis (HCC) 11/12/2023   Acute on chronic hypoxic respiratory failure (HCC) 10/05/2023   Pneumothorax, right 09/16/2023   COPD with acute exacerbation (HCC) 09/01/2023   Chronic hypoxic respiratory failure (HCC) 09/01/2023   Continuous dependence on cigarette smoking 09/01/2023   History of CAD (coronary artery disease) 09/01/2023   CAP (community acquired pneumonia) 09/01/2023   Full code status 06/14/2023   Acute hypoxic respiratory failure (HCC) 06/13/2023   Acute exacerbation of chronic obstructive pulmonary disease (COPD) (HCC) 06/12/2023   Nicotine  dependence 05/03/2023   Substance abuse (HCC) 09/10/2022   Iron  deficiency anemia 11/02/2021   Major depressive disorder, recurrent, in full remission with anxious distress (HCC) 05/31/2021   Chronic neck pain 02/26/2021   GAD (generalized anxiety disorder) 01/08/2021   Esophageal dysphagia 04/28/2019   Bipolar disorder (HCC) 09/17/2018   Insomnia 10/28/2017   Prediabetes 05/28/2017   QT prolongation 03/25/2017   Psoriasis 06/22/2015   COPD (chronic obstructive pulmonary disease) (HCC)GOLD E 05/18/2015  Anxiety and depression 07/10/2013   Essential hypertension 06/07/2013   Hyperlipidemia 06/07/2013   Asthma, chronic 06/07/2013   Emphysema lung (HCC) 06/07/2013   Chronic back pain 06/07/2013   CAD S/P percutaneous coronary angioplasty 06/07/2013   GERD (gastroesophageal reflux disease) 06/07/2013   Breast lump on left  side at 3 o'clock position 10/13/2012   S/P abdominal hysterectomy and right salpingo-oophorectomy 08/29/2011   COPD exacerbation (HCC) 09/15/2009   TOBACCO ABUSE 07/17/2009   Chronic rhinitis 07/17/2009   Lung nodule < 6cm on CT 07/17/2009   Hypothyroidism 12/22/2008   Chronic depression 12/22/2008    Current Outpatient Medications:    albuterol  (VENTOLIN  HFA) 108 (90 Base) MCG/ACT inhaler, INHALE 2 PUFFS into THE lungs EVERY 6 HOURS AS NEEDED wheezing AND SHORTNESS OF BREATH, Disp: 54 g, Rfl: 1   alendronate  (FOSAMAX ) 70 MG tablet, Take 1 tablet (70 mg total) by mouth every 7 (seven) days. Take with a full glass of water on an empty stomach., Disp: 12 tablet, Rfl: 1   aspirin  EC 81 MG tablet, Take 81 mg by mouth in the morning., Disp: , Rfl:    atorvastatin  (LIPITOR ) 80 MG tablet, Take 1 tablet (80 mg total) by mouth daily., Disp: 90 tablet, Rfl: 3   azithromycin  (ZITHROMAX  Z-PAK) 250 MG tablet, Take 2 tablets (500 mg total) by mouth daily for 1 day, THEN 1 tablet (250 mg total) daily for 4 days. (Patient not taking: No sig reported), Disp: 6 tablet, Rfl: 0   busPIRone  (BUSPAR ) 15 MG tablet, Take 2 tablets (30 mg total) by mouth in the morning AND 1 tablet (15 mg total) daily at 12 noon AND 1 tablet (15 mg total) every evening., Disp: 360 tablet, Rfl: 0   clonazePAM  (KLONOPIN ) 0.5 MG tablet, Take 1 tablet (0.5 mg total) by mouth 2 (two) times daily as needed., Disp: 60 tablet, Rfl: 0   doxepin  (SINEQUAN ) 75 MG capsule, Take 1 capsule (75 mg total) by mouth at bedtime., Disp: 90 capsule, Rfl: 0   escitalopram  (LEXAPRO ) 20 MG tablet, Take 1 tablet (20 mg total) by mouth daily., Disp: 90 tablet, Rfl: 0   ezetimibe  (ZETIA ) 10 MG tablet, Take 1 tablet (10 mg total) by mouth daily., Disp: 90 tablet, Rfl: 3   Fluticasone -Umeclidin-Vilant (TRELEGY ELLIPTA ) 200-62.5-25 MCG/ACT AEPB, Inhale 1 puff into the lungs daily., Disp: 60 each, Rfl: 11   furosemide  (LASIX ) 40 MG tablet, Take 1 tablet (40 mg  total) by mouth daily., Disp: 90 tablet, Rfl: 1   gabapentin  (NEURONTIN ) 600 MG tablet, Take 1 tablet (600 mg total) by mouth 2 (two) times daily., Disp: 180 tablet, Rfl: 2   hydrOXYzine  (ATARAX ) 25 MG tablet, Take 1 tablet (25 mg total) by mouth at bedtime as needed., Disp: 30 tablet, Rfl: 2   ipratropium-albuterol  (DUONEB) 0.5-2.5 (3) MG/3ML SOLN, Take 3 mLs by nebulization every 8 (eight) hours., Disp: 90 mL, Rfl: 1   levothyroxine  (SYNTHROID ) 112 MCG tablet, Take 1 tablet (112 mcg total) by mouth daily., Disp: 90 tablet, Rfl: 2   metFORMIN  (GLUCOPHAGE ) 500 MG tablet, Take 1 tablet (500 mg total) by mouth 2 (two) times daily with a meal., Disp: 180 tablet, Rfl: 1   mirtazapine  (REMERON ) 45 MG tablet, Take 1 tablet (45 mg total) by mouth at bedtime., Disp: 90 tablet, Rfl: 0   montelukast  (SINGULAIR ) 10 MG tablet, Take 1 tablet (10 mg total) by mouth at bedtime., Disp: 90 tablet, Rfl: 2   nebivolol  (BYSTOLIC ) 10 MG tablet, Take 1 tablet (  10 mg total) by mouth daily., Disp: 90 tablet, Rfl: 3   nitroGLYCERIN  (NITROSTAT ) 0.4 MG SL tablet, Place 1 tablet (0.4 mg total) under the tongue every 5 (five) minutes as needed for chest pain., Disp: 25 tablet, Rfl: 3   omeprazole  (PRILOSEC) 40 MG capsule, Take 1 capsule (40 mg total) by mouth 2 (two) times daily before a meal., Disp: 60 capsule, Rfl: 1   OXYGEN , Inhale 2-3 L/min into the lungs continuous., Disp: , Rfl:    predniSONE  (DELTASONE ) 20 MG tablet, Take 1 tablet (20 mg total) by mouth daily with breakfast., Disp: 7 tablet, Rfl: 0   ticagrelor  (BRILINTA ) 60 MG TABS tablet, Take 1 tablet (60 mg total) by mouth 2 (two) times daily., Disp: 180 tablet, Rfl: 2   varenicline  (CHANTIX ) 1 MG tablet, Take 1 tablet (1 mg total) by mouth 2 (two) times daily., Disp: 60 tablet, Rfl: 3 Allergies  Allergen Reactions   Avocado Anaphylaxis   Latex Shortness Of Breath and Rash   Codeine  Nausea Only     Social History   Socioeconomic History   Marital status:  Divorced    Spouse name: Not on file   Number of children: 1   Years of education: Not on file   Highest education level: 9th grade  Occupational History   Not on file  Tobacco Use   Smoking status: Every Day    Current packs/day: 0.50    Average packs/day: 0.5 packs/day for 50.0 years (25.0 ttl pk-yrs)    Types: Cigarettes   Smokeless tobacco: Never  Vaping Use   Vaping status: Some Days   Substances: Nicotine   Substance and Sexual Activity   Alcohol use: Yes    Comment: once a week   Drug use: Yes    Types: Marijuana    Comment: history of cocaine use, been about a year, per patient (12/18/21)   Sexual activity: Not Currently    Birth control/protection: Surgical  Other Topics Concern   Not on file  Social History Narrative   Not on file   Social Drivers of Health   Financial Resource Strain: Medium Risk (08/07/2023)   Overall Financial Resource Strain (CARDIA)    Difficulty of Paying Living Expenses: Somewhat hard  Food Insecurity: No Food Insecurity (01/12/2024)   Hunger Vital Sign    Worried About Running Out of Food in the Last Year: Never true    Ran Out of Food in the Last Year: Never true  Transportation Needs: No Transportation Needs (01/12/2024)   PRAPARE - Administrator, Civil Service (Medical): No    Lack of Transportation (Non-Medical): No  Physical Activity: Inactive (08/07/2023)   Exercise Vital Sign    Days of Exercise per Week: 0 days    Minutes of Exercise per Session: 0 min  Stress: No Stress Concern Present (08/07/2023)   [employer] of Occupational Health - Occupational Stress Questionnaire    Feeling of Stress : Only a little  Social Connections: Moderately Integrated (01/09/2024)   Social Connection and Isolation Panel    Frequency of Communication with Friends and Family: More than three times a week    Frequency of Social Gatherings with Friends and Family: More than three times a week    Attends Religious Services: 1 to 4 times  per year    Active Member of [employer] or Organizations: No    Attends Banker Meetings: 1 to 4 times per year    Marital Status: Never married  Recent Concern: Social Connections - Moderately Isolated (12/04/2023)   Social Connection and Isolation Panel    Frequency of Communication with Friends and Family: Twice a week    Frequency of Social Gatherings with Friends and Family: Never    Attends Religious Services: 1 to 4 times per year    Active Member of Clubs or Organizations: No    Attends Banker Meetings: 1 to 4 times per year    Marital Status: Never married  Intimate Partner Violence: Not At Risk (01/12/2024)   Humiliation, Afraid, Rape, and Kick questionnaire    Fear of Current or Ex-Partner: No    Emotionally Abused: No    Physically Abused: No    Sexually Abused: No    Physical Exam      Future Appointments  Date Time Provider Department Center  04/09/2024  4:00 PM Collene Reginia BRAVO, [place] [facility] None  04/13/2024  4:00 PM Kara Dorn NOVAK, MD [facility] [address]  04/28/2024  1:30 PM [facility] None  04/28/2024  2:00 PM Onesimo Emaline Brink, MD [facility] None  05/24/2024  4:00 PM Kara Dorn NOVAK, MD [facility] [address]  06/21/2024  4:10 PM Vicci Barnie NOVAK, MD [facility] [address]  06/22/2024  4:10 PM [facility] [place] VISIT [facility] [address]  07/13/2024  4:00 PM Floretta Mallard, MD [facility]

## 2024-03-26 ENCOUNTER — Other Ambulatory Visit: Payer: Self-pay

## 2024-03-26 ENCOUNTER — Other Ambulatory Visit (HOSPITAL_COMMUNITY): Payer: Self-pay

## 2024-03-26 ENCOUNTER — Other Ambulatory Visit (HOSPITAL_COMMUNITY): Payer: Self-pay | Admitting: Physician Assistant

## 2024-03-26 DIAGNOSIS — F411 Generalized anxiety disorder: Secondary | ICD-10-CM

## 2024-03-26 MED ORDER — CLONAZEPAM 0.5 MG PO TABS
0.5000 mg | ORAL_TABLET | Freq: Two times a day (BID) | ORAL | 0 refills | Status: DC | PRN
  Filled 2024-03-26: qty 60, 30d supply, fill #0

## 2024-03-26 NOTE — Progress Notes (Signed)
 Provider was contacted by Ja'Bron N. Lewis CMA regarding patient's refill request. Patient's medication to be e-prescribed to pharmacy of choice.

## 2024-03-27 ENCOUNTER — Other Ambulatory Visit (HOSPITAL_COMMUNITY): Payer: Self-pay

## 2024-03-29 ENCOUNTER — Other Ambulatory Visit (HOSPITAL_COMMUNITY): Payer: Self-pay

## 2024-03-29 ENCOUNTER — Other Ambulatory Visit: Payer: Self-pay | Admitting: Internal Medicine

## 2024-03-29 ENCOUNTER — Encounter (HOSPITAL_COMMUNITY): Payer: Self-pay

## 2024-03-29 DIAGNOSIS — I2510 Atherosclerotic heart disease of native coronary artery without angina pectoris: Secondary | ICD-10-CM | POA: Diagnosis not present

## 2024-03-29 DIAGNOSIS — Z1231 Encounter for screening mammogram for malignant neoplasm of breast: Secondary | ICD-10-CM

## 2024-03-29 DIAGNOSIS — F32A Depression, unspecified: Secondary | ICD-10-CM | POA: Diagnosis not present

## 2024-03-29 DIAGNOSIS — I5033 Acute on chronic diastolic (congestive) heart failure: Secondary | ICD-10-CM | POA: Diagnosis not present

## 2024-03-29 DIAGNOSIS — D649 Anemia, unspecified: Secondary | ICD-10-CM | POA: Diagnosis not present

## 2024-03-29 DIAGNOSIS — E1122 Type 2 diabetes mellitus with diabetic chronic kidney disease: Secondary | ICD-10-CM | POA: Diagnosis not present

## 2024-03-29 DIAGNOSIS — F411 Generalized anxiety disorder: Secondary | ICD-10-CM | POA: Diagnosis not present

## 2024-03-29 DIAGNOSIS — J441 Chronic obstructive pulmonary disease with (acute) exacerbation: Secondary | ICD-10-CM | POA: Diagnosis not present

## 2024-03-29 DIAGNOSIS — I130 Hypertensive heart and chronic kidney disease with heart failure and stage 1 through stage 4 chronic kidney disease, or unspecified chronic kidney disease: Secondary | ICD-10-CM | POA: Diagnosis not present

## 2024-03-29 DIAGNOSIS — N189 Chronic kidney disease, unspecified: Secondary | ICD-10-CM | POA: Diagnosis not present

## 2024-03-31 ENCOUNTER — Ambulatory Visit
Admission: RE | Admit: 2024-03-31 | Discharge: 2024-03-31 | Disposition: A | Discharge status: Home / Self Care | Source: Ambulatory Visit

## 2024-03-31 ENCOUNTER — Encounter: Payer: Self-pay | Admitting: Hematology

## 2024-03-31 DIAGNOSIS — Z1231 Encounter for screening mammogram for malignant neoplasm of breast: Secondary | ICD-10-CM

## 2024-04-02 ENCOUNTER — Encounter: Payer: Self-pay | Admitting: Hematology

## 2024-04-05 ENCOUNTER — Ambulatory Visit: Payer: Self-pay | Admitting: Internal Medicine

## 2024-04-05 ENCOUNTER — Other Ambulatory Visit (HOSPITAL_COMMUNITY): Payer: Self-pay

## 2024-04-05 ENCOUNTER — Other Ambulatory Visit: Payer: Self-pay | Admitting: Internal Medicine

## 2024-04-06 ENCOUNTER — Other Ambulatory Visit (HOSPITAL_COMMUNITY): Payer: Self-pay

## 2024-04-06 ENCOUNTER — Other Ambulatory Visit (HOSPITAL_COMMUNITY): Payer: Self-pay | Admitting: Emergency Medicine

## 2024-04-06 MED ORDER — ALENDRONATE SODIUM 70 MG PO TABS
70.0000 mg | ORAL_TABLET | ORAL | 0 refills | Status: AC
  Filled 2024-04-06: qty 12, 84d supply, fill #0

## 2024-04-06 NOTE — Progress Notes (Signed)
 Paramedicine Encounter    Patient ID: [name] [name], female    DOB: [birth year], 68 y.o.   MRN: [id number]   Complaints chronic smokers cough  Assessment A&O x 4, skin W&D w/ good color.    Compliance with meds Missed 2 doses out of the past weeks meds.  Pill box filled Filled by daughter  Refills needed None  Meds changes since last visit None    Social changes NONE   BP 128/64 (BP Location: Left Arm, Patient Position: Sitting, Cuff Size: Normal)   Pulse 64   Resp 18   Wt 160 lb 9.6 oz (72.8 kg)   SpO2 96%   BMI 30.35 kg/m  Weight yesterday-157lb Last visit weight-158lb  ATF [name] A&O x 4, skin W&D w/ good color.  Pt. Has no new complaints. Pillbox reveals that she has missed an a.m. dose and a p.m. dose on separate days.  She states at time of today's visit that she has not been out of bed long and has not yet taken today's meds.  Pt seems to admittedly be sleeping a lot.  I encouraged her to try and make a schedule for herself to get up in the mornings at a specific time and go to bed at a specific time.  Discussed how creating a routine could benefit her and improve how she feels plus give her better timing regimen for her med doses.  She advised she would work on same. Also reached out to pt's daughter regarding medications and discussed the availability to have pt's meds set up in pull packs.  She is currently getting her meds from [facility] and I advised her they could package her meds and deliver same to her.  She states that this is something she would consider for her mother in the future.   ACTION: Home visit completed  [name] [phone] 04/06/24  Patient Care Team: Vicci Barnie [name], MD as PCP - General (Internal Medicine) Burnard Debby LABOR, MD (Inactive) as PCP - Cardiology (Cardiology) Shaaron Lamar HERO, MD as Consulting Physician (Gastroenterology) Onesimo Emaline Brink, MD as Consulting Physician (Hematology) Collene Reginia BRAVO, [place] as Physician Assistant (Physician Assistant) Carolee Heron [name], RN as Case Manager  Patient Active Problem List   Diagnosis Date Noted   Hypokalemia 01/08/2024   Acute on chronic heart failure with preserved ejection fraction (HFpEF, >= 50%) (HCC) 12/05/2023   At risk for polypharmacy 12/04/2023   Long-term current use of antidepressant 11/17/2023   Long-term current use of benzodiazepine 11/17/2023   Sigmoid diverticulitis 11/12/2023   Acute kidney injury superimposed on chronic kidney disease 11/12/2023   CKD (chronic kidney disease) stage 2, GFR 60-89 ml/min 11/12/2023   Sepsis secondary to sigmoid diverticulitis (HCC) 11/12/2023   Acute on chronic hypoxic respiratory failure (HCC) 10/05/2023   Pneumothorax, right 09/16/2023   COPD with acute exacerbation (HCC) 09/01/2023   Chronic hypoxic respiratory failure (HCC) 09/01/2023   Continuous dependence on cigarette smoking 09/01/2023   History of CAD (coronary artery disease) 09/01/2023   CAP (community acquired pneumonia) 09/01/2023   Full code status 06/14/2023   Acute hypoxic respiratory failure (HCC) 06/13/2023   Acute exacerbation of chronic obstructive pulmonary disease (COPD) (HCC) 06/12/2023   Nicotine  dependence 05/03/2023   Substance abuse (HCC) 09/10/2022   Iron  deficiency anemia 11/02/2021   Major depressive disorder, recurrent, in full remission with anxious distress 05/31/2021   Chronic neck pain 02/26/2021   GAD (generalized anxiety disorder) 01/08/2021   Esophageal  dysphagia 04/28/2019   Bipolar disorder (HCC) 09/17/2018   Insomnia 10/28/2017   Prediabetes 05/28/2017   QT prolongation 03/25/2017   Psoriasis 06/22/2015   COPD (chronic obstructive pulmonary disease) (HCC)GOLD E 05/18/2015   Anxiety and depression 07/10/2013   Essential hypertension 06/07/2013   Hyperlipidemia 06/07/2013   Asthma, chronic 06/07/2013   Emphysema lung (HCC) 06/07/2013   Chronic back pain 06/07/2013   CAD S/P percutaneous  coronary angioplasty 06/07/2013   GERD (gastroesophageal reflux disease) 06/07/2013   Breast lump on left side at 3 o'clock position 10/13/2012   S/P abdominal hysterectomy and right salpingo-oophorectomy 08/29/2011   COPD exacerbation (HCC) 09/15/2009   TOBACCO ABUSE 07/17/2009   Chronic rhinitis 07/17/2009   Lung nodule < 6cm on CT 07/17/2009   Hypothyroidism 12/22/2008   Chronic depression 12/22/2008    Current Outpatient Medications:    albuterol  (VENTOLIN  HFA) 108 (90 Base) MCG/ACT inhaler, INHALE 2 PUFFS into THE lungs EVERY 6 HOURS AS NEEDED wheezing AND SHORTNESS OF BREATH, Disp: 54 g, Rfl: 1   alendronate  (FOSAMAX ) 70 MG tablet, Take 1 tablet (70 mg total) by mouth every 7 (seven) days. Take with a full glass of water on an empty stomach., Disp: 12 tablet, Rfl: 0   aspirin  EC 81 MG tablet, Take 81 mg by mouth in the morning., Disp: , Rfl:    atorvastatin  (LIPITOR ) 80 MG tablet, Take 1 tablet (80 mg total) by mouth daily., Disp: 90 tablet, Rfl: 3   busPIRone  (BUSPAR ) 15 MG tablet, Take 2 tablets (30 mg total) by mouth in the morning AND 1 tablet (15 mg total) daily at 12 noon AND 1 tablet (15 mg total) every evening., Disp: 360 tablet, Rfl: 0   clonazePAM  (KLONOPIN ) 0.5 MG tablet, Take 1 tablet (0.5 mg total) by mouth 2 (two) times daily as needed., Disp: 60 tablet, Rfl: 0   doxepin  (SINEQUAN ) 75 MG capsule, Take 1 capsule (75 mg total) by mouth at bedtime., Disp: 90 capsule, Rfl: 0   escitalopram  (LEXAPRO ) 20 MG tablet, Take 1 tablet (20 mg total) by mouth daily., Disp: 90 tablet, Rfl: 0   ezetimibe  (ZETIA ) 10 MG tablet, Take 1 tablet (10 mg total) by mouth daily., Disp: 90 tablet, Rfl: 3   Fluticasone -Umeclidin-Vilant (TRELEGY ELLIPTA ) 200-62.5-25 MCG/ACT AEPB, Inhale 1 puff into the lungs daily., Disp: 60 each, Rfl: 11   furosemide  (LASIX ) 40 MG tablet, Take 1 tablet (40 mg total) by mouth daily., Disp: 90 tablet, Rfl: 1   gabapentin  (NEURONTIN ) 600 MG tablet, Take 1 tablet (600 mg  total) by mouth 2 (two) times daily., Disp: 180 tablet, Rfl: 2   hydrOXYzine  (ATARAX ) 25 MG tablet, Take 1 tablet (25 mg total) by mouth at bedtime as needed., Disp: 30 tablet, Rfl: 2   ipratropium-albuterol  (DUONEB) 0.5-2.5 (3) MG/3ML SOLN, Take 3 mLs by nebulization every 8 (eight) hours., Disp: 90 mL, Rfl: 1   levothyroxine  (SYNTHROID ) 112 MCG tablet, Take 1 tablet (112 mcg total) by mouth daily., Disp: 90 tablet, Rfl: 2   metFORMIN  (GLUCOPHAGE ) 500 MG tablet, Take 1 tablet (500 mg total) by mouth 2 (two) times daily with a meal., Disp: 180 tablet, Rfl: 1   mirtazapine  (REMERON ) 45 MG tablet, Take 1 tablet (45 mg total) by mouth at bedtime., Disp: 90 tablet, Rfl: 0   montelukast  (SINGULAIR ) 10 MG tablet, Take 1 tablet (10 mg total) by mouth at bedtime., Disp: 90 tablet, Rfl: 2   nebivolol  (BYSTOLIC ) 10 MG tablet, Take 1 tablet (10 mg total) by  mouth daily., Disp: 90 tablet, Rfl: 3   nitroGLYCERIN  (NITROSTAT ) 0.4 MG SL tablet, Place 1 tablet (0.4 mg total) under the tongue every 5 (five) minutes as needed for chest pain., Disp: 25 tablet, Rfl: 3   omeprazole  (PRILOSEC) 40 MG capsule, Take 1 capsule (40 mg total) by mouth 2 (two) times daily before a meal., Disp: 60 capsule, Rfl: 1   OXYGEN , Inhale 2-3 L/min into the lungs continuous., Disp: , Rfl:    ticagrelor  (BRILINTA ) 60 MG TABS tablet, Take 1 tablet (60 mg total) by mouth 2 (two) times daily., Disp: 180 tablet, Rfl: 2   varenicline  (CHANTIX ) 1 MG tablet, Take 1 tablet (1 mg total) by mouth 2 (two) times daily., Disp: 60 tablet, Rfl: 3   azithromycin  (ZITHROMAX  Z-PAK) 250 MG tablet, Take 2 tablets (500 mg total) by mouth daily for 1 day, THEN 1 tablet (250 mg total) daily for 4 days. (Patient not taking: No sig reported), Disp: 6 tablet, Rfl: 0   predniSONE  (DELTASONE ) 20 MG tablet, Take 1 tablet (20 mg total) by mouth daily with breakfast. (Patient not taking: Reported on 04/06/2024), Disp: 7 tablet, Rfl: 0 Allergies  Allergen Reactions    Avocado Anaphylaxis   Latex Shortness Of Breath and Rash   Codeine  Nausea Only     Social History   Socioeconomic History   Marital status: Divorced    Spouse name: Not on file   Number of children: 1   Years of education: Not on file   Highest education level: 9th grade  Occupational History   Not on file  Tobacco Use   Smoking status: Every Day    Current packs/day: 0.50    Average packs/day: 0.5 packs/day for 50.0 years (25.0 ttl pk-yrs)    Types: Cigarettes   Smokeless tobacco: Never  Vaping Use   Vaping status: Some Days   Substances: Nicotine   Substance and Sexual Activity   Alcohol use: Yes    Comment: once a week   Drug use: Yes    Types: Marijuana    Comment: history of cocaine use, been about a year, per patient (12/18/21)   Sexual activity: Not Currently    Birth control/protection: Surgical  Other Topics Concern   Not on file  Social History Narrative   Not on file   Social Drivers of Health   Financial Resource Strain: Medium Risk (08/07/2023)   Overall Financial Resource Strain (CARDIA)    Difficulty of Paying Living Expenses: Somewhat hard  Food Insecurity: No Food Insecurity (01/12/2024)   Hunger Vital Sign    Worried About Running Out of Food in the Last Year: Never true    Ran Out of Food in the Last Year: Never true  Transportation Needs: No Transportation Needs (01/12/2024)   PRAPARE - Administrator, Civil Service (Medical): No    Lack of Transportation (Non-Medical): No  Physical Activity: Inactive (08/07/2023)   Exercise Vital Sign    Days of Exercise per Week: 0 days    Minutes of Exercise per Session: 0 min  Stress: No Stress Concern Present (08/07/2023)   [employer] of Occupational Health - Occupational Stress Questionnaire    Feeling of Stress : Only a little  Social Connections: Moderately Integrated (01/09/2024)   Social Connection and Isolation Panel    Frequency of Communication with Friends and Family: More than  three times a week    Frequency of Social Gatherings with Friends and Family: More than three times a week  Attends Religious Services: 1 to 4 times per year    Active Member of Clubs or Organizations: No    Attends Banker Meetings: 1 to 4 times per year    Marital Status: Never married  Recent Concern: Social Connections - Moderately Isolated (12/04/2023)   Social Connection and Isolation Panel    Frequency of Communication with Friends and Family: Twice a week    Frequency of Social Gatherings with Friends and Family: Never    Attends Religious Services: 1 to 4 times per year    Active Member of Clubs or Organizations: No    Attends Banker Meetings: 1 to 4 times per year    Marital Status: Never married  Intimate Partner Violence: Not At Risk (01/12/2024)   Humiliation, Afraid, Rape, and Kick questionnaire    Fear of Current or Ex-Partner: No    Emotionally Abused: No    Physically Abused: No    Sexually Abused: No    Physical Exam      Future Appointments  Date Time Provider Department Center  04/09/2024  4:00 PM Collene Reginia BRAVO, [place] [facility] None  04/13/2024  4:00 PM Kara Dorn NOVAK, MD [facility] [address]  04/28/2024  1:30 PM [facility] None  04/28/2024  2:00 PM Onesimo Emaline Brink, MD [facility] None  05/24/2024  4:00 PM Kara Dorn NOVAK, MD [facility] [address]  06/21/2024  4:10 PM Vicci Barnie NOVAK, MD [facility] [address]  06/22/2024  4:10 PM [facility] [place] VISIT [facility] [address]  07/13/2024  4:00 PM Floretta Mallard, MD [facility]V

## 2024-04-06 NOTE — Telephone Encounter (Signed)
 Requested Prescriptions  Pending Prescriptions Disp Refills   alendronate  (FOSAMAX ) 70 MG tablet 12 tablet 0    Sig: Take 1 tablet (70 mg total) by mouth every 7 (seven) days. Take with a full glass of water on an empty stomach.     Endocrinology:  Bisphosphonates Failed - 04/06/2024 10:10 AM      Failed - Ca in normal range and within 360 days    Calcium   Date Value Ref Range Status  01/12/2024 10.4 (H) 8.9 - 10.3 mg/dL Final   Calcium , Ion  Date Value Ref Range Status  12/04/2023 1.27 1.15 - 1.40 mmol/L Final         Failed - Vitamin D  in normal range and within 360 days    Vit D, 25-Hydroxy  Date Value Ref Range Status  03/04/2023 35.0 30.0 - 100.0 ng/mL Final    Comment:    Vitamin D  deficiency has been defined by the Institute of Medicine and an Endocrine Society practice guideline as a level of serum 25-OH vitamin D  less than 20 ng/mL (1,2). The Endocrine Society went on to further define vitamin D  insufficiency as a level between 21 and 29 ng/mL (2). 1. IOM (Institute of Medicine). 2010. Dietary reference    intakes for calcium  and D. [place]: The    [employer]. 2. Holick MF, Binkley [place], Bischoff-Ferrari HA, et al.    Evaluation, treatment, and prevention of vitamin D     deficiency: an Endocrine Society clinical practice    guideline. JCEM. 2011 Jul; 96(7):1911-30.          Failed - Phosphate in normal range and within 360 days    No results found for: PHOS       Passed - Cr in normal range and within 360 days    Creatinine  Date Value Ref Range Status  03/24/2023 0.80 0.44 - 1.00 mg/dL Final   Creat  Date Value Ref Range Status  04/16/2016 0.93 0.50 - 1.05 mg/dL Final    Comment:      For patients > or = 68 years of age: The upper reference limit for Creatinine is approximately 13% higher for people identified as African-American.      Creatinine, Ser  Date Value Ref Range Status  01/12/2024 0.83 0.44 - 1.00 mg/dL Final    Creatinine,U  Date Value Ref Range Status  01/11/2010 61.7 mg/dL Final    Comment:    See lab report for associated comment(s)         Passed - Mg Level in normal range and within 360 days    Magnesium   Date Value Ref Range Status  01/08/2024 1.7 1.7 - 2.4 mg/dL Final    Comment:    Performed at [facility], [address] [place], [place] [address]         Passed - eGFR is 30 or above and within 360 days    GFR, Est African American  Date Value Ref Range Status  04/16/2016 78 >=60 mL/min Final   GFR calc Af Amer  Date Value Ref Range Status  04/05/2019 77 >59 mL/min/1.73 Final   GFR, Est Non African American  Date Value Ref Range Status  04/16/2016 67 >=60 mL/min Final   GFR, Estimated  Date Value Ref Range Status  01/12/2024 >60 >60 mL/min Final    Comment:    (NOTE) Calculated using the CKD-EPI Creatinine Equation (2021)   03/24/2023 >60 >60 mL/min Final    Comment:    (  NOTE) Calculated using the CKD-EPI Creatinine Equation (2021)    GFR  Date Value Ref Range Status  08/30/2020 70.52 >60.00 mL/min Final    Comment:    Calculated using the CKD-EPI Creatinine Equation (2021)   eGFR  Date Value Ref Range Status  12/11/2023 82 >59 mL/min/1.73 Final         Passed - Valid encounter within last 12 months    Recent Outpatient Visits           3 weeks ago COPD, severe (HCC)   [facility] [place]. [facility] Vicci Barnie NOVAK, MD   2 months ago Hospital discharge follow-up   [facility] Celestia Rosaline SQUIBB, NP   3 months ago Hospital discharge follow-up   [facility] - A Dept Of [place]. [facility] Vicci Barnie B, MD   4 months ago Right lower quadrant abdominal pain   [facility] [place] - A Dept Of [place]. [facility] Vicci Barnie NOVAK, MD   6 months ago Hospital discharge follow-up   [facility]  [facility] - A Dept Of [place]. [facility] Vicci Barnie NOVAK, MD       Future Appointments             In 3 months Floretta Mallard, MD [facility] Dept of [facility] [place]. [facility], H&V            Passed - Bone Mineral Density or Dexa Scan completed in the last 2 years

## 2024-04-07 ENCOUNTER — Telehealth (HOSPITAL_COMMUNITY): Payer: Self-pay | Admitting: Physician Assistant

## 2024-04-09 ENCOUNTER — Telehealth (HOSPITAL_COMMUNITY): Admitting: Physician Assistant

## 2024-04-12 ENCOUNTER — Other Ambulatory Visit: Payer: Self-pay

## 2024-04-13 ENCOUNTER — Other Ambulatory Visit (HOSPITAL_COMMUNITY): Payer: Self-pay

## 2024-04-13 ENCOUNTER — Ambulatory Visit (INDEPENDENT_AMBULATORY_CARE_PROVIDER_SITE_OTHER): Admitting: Pulmonary Disease

## 2024-04-13 ENCOUNTER — Other Ambulatory Visit: Payer: Self-pay

## 2024-04-13 ENCOUNTER — Encounter: Payer: Self-pay | Admitting: Pulmonary Disease

## 2024-04-13 ENCOUNTER — Encounter: Payer: Self-pay | Admitting: Hematology

## 2024-04-13 VITALS — BP 125/79 | HR 65 | Ht 61.0 in | Wt 163.6 lb

## 2024-04-13 DIAGNOSIS — F17210 Nicotine dependence, cigarettes, uncomplicated: Secondary | ICD-10-CM | POA: Diagnosis not present

## 2024-04-13 DIAGNOSIS — J441 Chronic obstructive pulmonary disease with (acute) exacerbation: Secondary | ICD-10-CM

## 2024-04-13 DIAGNOSIS — J4489 Other specified chronic obstructive pulmonary disease: Secondary | ICD-10-CM

## 2024-04-13 DIAGNOSIS — R911 Solitary pulmonary nodule: Secondary | ICD-10-CM | POA: Diagnosis not present

## 2024-04-13 DIAGNOSIS — J9610 Chronic respiratory failure, unspecified whether with hypoxia or hypercapnia: Secondary | ICD-10-CM

## 2024-04-13 DIAGNOSIS — Z9981 Dependence on supplemental oxygen: Secondary | ICD-10-CM

## 2024-04-13 MED ORDER — AZITHROMYCIN 250 MG PO TABS
250.0000 mg | ORAL_TABLET | Freq: Every day | ORAL | 3 refills | Status: DC
  Filled 2024-04-13: qty 30, 30d supply, fill #0

## 2024-04-13 MED ORDER — PREDNISONE 10 MG PO TABS
ORAL_TABLET | ORAL | 0 refills | Status: AC
  Filled 2024-04-13 (×2): qty 30, 12d supply, fill #0

## 2024-04-13 MED ORDER — AZITHROMYCIN 250 MG PO TABS
250.0000 mg | ORAL_TABLET | Freq: Every day | ORAL | 1 refills | Status: DC
  Filled 2024-04-13: qty 5, 5d supply, fill #0
  Filled 2024-04-13: qty 85, 85d supply, fill #0
  Filled 2024-04-13: qty 90, 90d supply, fill #0
  Filled 2024-04-13: qty 28, 28d supply, fill #0
  Filled 2024-04-14: qty 85, 85d supply, fill #1
  Filled 2024-05-03: qty 90, 90d supply, fill #1

## 2024-04-13 NOTE — Progress Notes (Signed)
 Synopsis: Referred in January 2025 for hospital follow up for COPD exacerbation  Subjective:   PATIENT ID: [name] [name] GENDER: female DOB: July 07, 1956, MRN: [id number]  HPI  Chief Complaint  Patient presents with   Medical Management of Chronic Issues   [name] [name] is a 68 year old woman, daily smoker with history of COPD, DMII, GERD, and CAD who returns to pulmonary clinic for chronic hypoxemic respiratory failure and COPD.  She has on going dyspnea, cough and sputum production. She has been treated for multiple COPD exacerbations over last 3 months. She ran out of daily azithromycin .   She is smoking 6-8 cigarettes daily. She is taking chantix , patches and nicotine  gum.   Past Medical History:  Diagnosis Date   Allergy    seasonal   Anemia    Anxiety    takes Lexapro  daily   Arthritis    back, from neck down pass my bra area (03/25/2017)   Asthma    Bartholin gland cyst 08/29/2011   Bruises easily    pt is on Effient    Chronic back pain    herniated nucleus pulposus   Chronic back pain    neck to bra area; lower back (03/25/2017)   Chronic kidney disease    recurrent UTI's this year 2022   COPD (chronic obstructive pulmonary disease) (HCC)    early stages   Coronary artery disease    Depression    takes Klonopin  daily   Diabetes mellitus without complication (HCC)    Diverticulosis    Fibroadenoma of left breast    GERD (gastroesophageal reflux disease)    takes Nexium  daily   H/O hiatal hernia    Heart attack (HCC) 2011   Hemorrhoids    Hernia    Hyperlipidemia    takes Lipitor  daily   Hypertension    takes Losartan  daily and Labetalol  bid   Hypothyroidism    takes Synthroid  daily   Insomnia    hydroxyzine  prn   Joint pain    Pneumonia    couple times (03/25/2017)   Pre-diabetes    just found out 1 wk ago (03/25/2017)   Psoriasis    elbows,knees,back   Shortness of breath    with exertion   Slowing of urinary stream    Stress  incontinence      Family History  Problem Relation Age of Onset   Heart disease Mother    Hypertension Mother    Stroke Mother    Mental illness Mother    Heart disease Father    Hypertension Father    Diabetes Father    Colon polyps Brother    Heart disease Maternal Grandfather    Cancer Paternal Grandmother        mouth   Anesthesia problems Daughter    Breast cancer Maternal Aunt    Throat cancer Maternal Uncle    Thyroid  disease Paternal Aunt    Hypotension Neg Hx    Malignant hyperthermia Neg Hx    Pseudochol deficiency Neg Hx    Colon cancer Neg Hx    Stomach cancer Neg Hx    Esophageal cancer Neg Hx    Pancreatic cancer Neg Hx    Rectal cancer Neg Hx      Social History   Socioeconomic History   Marital status: Divorced    Spouse name: Not on file   Number of children: 1   Years of education: Not on file   Highest education level: 9th grade  Occupational History   Not on file  Tobacco Use   Smoking status: Every Day    Current packs/day: 0.50    Average packs/day: 0.5 packs/day for 50.0 years (25.0 ttl pk-yrs)    Types: Cigarettes   Smokeless tobacco: Never  Vaping Use   Vaping status: Some Days   Substances: Nicotine   Substance and Sexual Activity   Alcohol use: Yes    Comment: once a week   Drug use: Yes    Types: Marijuana    Comment: history of cocaine use, been about a year, per patient (12/18/21)   Sexual activity: Not Currently    Birth control/protection: Surgical  Other Topics Concern   Not on file  Social History Narrative   Not on file   Social Drivers of Health   Financial Resource Strain: Medium Risk (08/07/2023)   Overall Financial Resource Strain (CARDIA)    Difficulty of Paying Living Expenses: Somewhat hard  Food Insecurity: No Food Insecurity (01/12/2024)   Hunger Vital Sign    Worried About Running Out of Food in the Last Year: Never true    Ran Out of Food in the Last Year: Never true  Transportation Needs: No  Transportation Needs (01/12/2024)   PRAPARE - Administrator, Civil Service (Medical): No    Lack of Transportation (Non-Medical): No  Physical Activity: Inactive (08/07/2023)   Exercise Vital Sign    Days of Exercise per Week: 0 days    Minutes of Exercise per Session: 0 min  Stress: No Stress Concern Present (08/07/2023)   [employer] of Occupational Health - Occupational Stress Questionnaire    Feeling of Stress : Only a little  Social Connections: Moderately Integrated (01/09/2024)   Social Connection and Isolation Panel    Frequency of Communication with Friends and Family: More than three times a week    Frequency of Social Gatherings with Friends and Family: More than three times a week    Attends Religious Services: 1 to 4 times per year    Active Member of [employer] or Organizations: No    Attends Engineer, structural: 1 to 4 times per year    Marital Status: Never married  Recent Concern: Social Connections - Moderately Isolated (12/04/2023)   Social Connection and Isolation Panel    Frequency of Communication with Friends and Family: Twice a week    Frequency of Social Gatherings with Friends and Family: Never    Attends Religious Services: 1 to 4 times per year    Active Member of [employer] or Organizations: No    Attends Engineer, structural: 1 to 4 times per year    Marital Status: Never married  Intimate Partner Violence: Not At Risk (01/12/2024)   Humiliation, Afraid, Rape, and Kick questionnaire    Fear of Current or Ex-Partner: No    Emotionally Abused: No    Physically Abused: No    Sexually Abused: No     Allergies  Allergen Reactions   Avocado Anaphylaxis   Latex Shortness Of Breath and Rash   Codeine  Nausea Only     Outpatient Medications Prior to Visit  Medication Sig Dispense Refill   albuterol  (VENTOLIN  HFA) 108 (90 Base) MCG/ACT inhaler INHALE 2 PUFFS into THE lungs EVERY 6 HOURS AS NEEDED wheezing AND SHORTNESS OF BREATH 54 g  1   alendronate  (FOSAMAX ) 70 MG tablet Take 1 tablet (70 mg total) by mouth every 7 (seven) days. Take with a full glass of water on  an empty stomach. 12 tablet 0   aspirin  EC 81 MG tablet Take 81 mg by mouth in the morning.     atorvastatin  (LIPITOR ) 80 MG tablet Take 1 tablet (80 mg total) by mouth daily. 90 tablet 3   azithromycin  (ZITHROMAX  Z-PAK) 250 MG tablet Take 2 tablets (500 mg total) by mouth daily for 1 day, THEN 1 tablet (250 mg total) daily for 4 days. 6 tablet 0   busPIRone  (BUSPAR ) 15 MG tablet Take 2 tablets (30 mg total) by mouth in the morning AND 1 tablet (15 mg total) daily at 12 noon AND 1 tablet (15 mg total) every evening. 360 tablet 0   clonazePAM  (KLONOPIN ) 0.5 MG tablet Take 1 tablet (0.5 mg total) by mouth 2 (two) times daily as needed. 60 tablet 0   doxepin  (SINEQUAN ) 75 MG capsule Take 1 capsule (75 mg total) by mouth at bedtime. 90 capsule 0   escitalopram  (LEXAPRO ) 20 MG tablet Take 1 tablet (20 mg total) by mouth daily. 90 tablet 0   ezetimibe  (ZETIA ) 10 MG tablet Take 1 tablet (10 mg total) by mouth daily. 90 tablet 3   Fluticasone -Umeclidin-Vilant (TRELEGY ELLIPTA ) 200-62.5-25 MCG/ACT AEPB Inhale 1 puff into the lungs daily. 60 each 11   furosemide  (LASIX ) 40 MG tablet Take 1 tablet (40 mg total) by mouth daily. 90 tablet 1   gabapentin  (NEURONTIN ) 600 MG tablet Take 1 tablet (600 mg total) by mouth 2 (two) times daily. 180 tablet 2   hydrOXYzine  (ATARAX ) 25 MG tablet Take 1 tablet (25 mg total) by mouth at bedtime as needed. 30 tablet 2   ipratropium-albuterol  (DUONEB) 0.5-2.5 (3) MG/3ML SOLN Take 3 mLs by nebulization every 8 (eight) hours. 90 mL 1   levothyroxine  (SYNTHROID ) 112 MCG tablet Take 1 tablet (112 mcg total) by mouth daily. 90 tablet 2   metFORMIN  (GLUCOPHAGE ) 500 MG tablet Take 1 tablet (500 mg total) by mouth 2 (two) times daily with a meal. 180 tablet 1   mirtazapine  (REMERON ) 45 MG tablet Take 1 tablet (45 mg total) by mouth at bedtime. 90 tablet  0   montelukast  (SINGULAIR ) 10 MG tablet Take 1 tablet (10 mg total) by mouth at bedtime. 90 tablet 2   nebivolol  (BYSTOLIC ) 10 MG tablet Take 1 tablet (10 mg total) by mouth daily. 90 tablet 3   nitroGLYCERIN  (NITROSTAT ) 0.4 MG SL tablet Place 1 tablet (0.4 mg total) under the tongue every 5 (five) minutes as needed for chest pain. 25 tablet 3   omeprazole  (PRILOSEC) 40 MG capsule Take 1 capsule (40 mg total) by mouth 2 (two) times daily before a meal. 60 capsule 1   OXYGEN  Inhale 2-3 L/min into the lungs continuous.     predniSONE  (DELTASONE ) 20 MG tablet Take 1 tablet (20 mg total) by mouth daily with breakfast. 7 tablet 0   ticagrelor  (BRILINTA ) 60 MG TABS tablet Take 1 tablet (60 mg total) by mouth 2 (two) times daily. 180 tablet 2   varenicline  (CHANTIX ) 1 MG tablet Take 1 tablet (1 mg total) by mouth 2 (two) times daily. 60 tablet 3   No facility-administered medications prior to visit.    Review of Systems  Constitutional:  Negative for chills, fever, malaise/fatigue and weight loss.  HENT:  Negative for congestion, sinus pain and sore throat.   Eyes: Negative.   Respiratory:  Positive for cough, sputum production, shortness of breath and wheezing. Negative for hemoptysis.   Cardiovascular:  Negative for chest pain, palpitations, orthopnea,  claudication and leg swelling.  Gastrointestinal:  Negative for abdominal pain, heartburn, nausea and vomiting.  Genitourinary: Negative.   Musculoskeletal:  Negative for joint pain and myalgias.  Skin:  Negative for rash.  Neurological:  Negative for weakness.  Endo/Heme/Allergies: Negative.   Psychiatric/Behavioral:  The patient is nervous/anxious.     Objective:   Vitals:   04/13/24 1604  BP: 125/79  Pulse: 65  SpO2: 99%  Weight: 163 lb 9.6 oz (74.2 kg)  Height: 5' 1 (1.549 m)    Physical Exam Constitutional:      General: She is not in acute distress.    Appearance: Normal appearance.  Eyes:     General: No scleral  icterus.    Conjunctiva/sclera: Conjunctivae normal.  Cardiovascular:     Rate and Rhythm: Normal rate and regular rhythm.  Pulmonary:     Breath sounds: Wheezing and rhonchi present. No rales.  Musculoskeletal:     Right lower leg: No edema.     Left lower leg: No edema.  Skin:    General: Skin is warm and dry.  Neurological:     General: No focal deficit present.    CBC    Component Value Date/Time   WBC 22.6 (H) 01/12/2024 1125   RBC 4.18 01/12/2024 1125   HGB 12.8 01/12/2024 1125   HGB 14.8 05/20/2023 1119   HCT 39.7 01/12/2024 1125   HCT 44.2 05/20/2023 1119   PLT 561 (H) 01/12/2024 1125   PLT 373 05/20/2023 1119   MCV 95.0 01/12/2024 1125   MCV 99 (H) 05/20/2023 1119   MCH 30.6 01/12/2024 1125   MCHC 32.2 01/12/2024 1125   RDW 14.0 01/12/2024 1125   RDW 13.2 05/20/2023 1119   LYMPHSABS 2.3 01/08/2024 2027   LYMPHSABS 2.6 04/05/2019 0926   MONOABS 1.1 (H) 01/08/2024 2027   EOSABS 0.0 01/08/2024 2027   EOSABS 0.3 04/05/2019 0926   BASOSABS 0.0 01/08/2024 2027   BASOSABS 0.1 04/05/2019 0926      Latest Ref Rng & Units 01/12/2024   11:25 AM 01/11/2024    9:29 AM 01/09/2024    5:32 AM  BMP  Glucose 70 - 99 mg/dL 851  876  763   BUN 8 - 23 mg/dL 18  18  14    Creatinine 0.44 - 1.00 mg/dL 9.16  9.25  9.12   Sodium 135 - 145 mmol/L 137  138  139   Potassium 3.5 - 5.1 mmol/L 4.0  3.6  3.4   Chloride 98 - 111 mmol/L 91  95  96   CO2 22 - 32 mmol/L 31  31  30    Calcium  8.9 - 10.3 mg/dL 89.5  9.4  9.5     Chest imaging: CXR 07/21/23 Cardiac shadow is within normal limits. Aortic calcifications are seen. The lungs are well aerated bilaterally. No focal infiltrate or sizable effusion is noted. No acute bony abnormality is noted. Old healed clavicular fracture is noted.  LCS CT Chest 02/07/23 1. Lung-RADS 3, probably benign findings. Short-term follow-up in 6 months is recommended with repeat low-dose chest CT without contrast (please use the following order, CT  CHEST LCS NODULE FOLLOW-UP W/O CM). Three new scattered solid pulmonary nodules, largest 5.0 mm in the posterior left upper lobe. 2. Three-vessel coronary atherosclerosis. 3. Aortic Atherosclerosis (ICD10-I70.0) and Emphysema (ICD10-J43.9).  PFT:     No data to display          Labs:  Path:  Echo:  Heart Catheterization:  Assessment & Plan:   Asthma-COPD overlap syndrome (HCC)  Discussion: [name] [name] is a 68 year old woman, daily smoker with history of COPD, DMII, GERD, and CAD who is referred to pulmonary clinic for hospital follow up of COPD exacerbation.  Chronic Obstructive Pulmonary Disease (COPD) with exacerbation - Continue Trelegy as maintenance therapy. - Continue Ohtuvayre  nebulizer twice daily. - Resume azithromycin  250 mg daily. (EKG 01/2024 reviewed) - Prescribe prednisone  taper. - Monitor oxygen  use to avoid hypercapnia. - Encouraged smoking cessation.  Nicotine  Dependence Continues to smoke despite cessation attempts. Smoking cessation critical for COPD management. Discussed smoking cessation for 3 minutes. - Continue nicotine  patches and Chantix . - Encourage reduction in smoking with goal of cessation.  Chronic Respiratory Failure - continue supplemental oxygen   Lung Nodule - enrolled in lung cancer screening program  Follow up in 3 months  Dorn Chill, MD [facility] Office: [phone]   Current Outpatient Medications:    albuterol  (VENTOLIN  HFA) 108 (90 Base) MCG/ACT inhaler, INHALE 2 PUFFS into THE lungs EVERY 6 HOURS AS NEEDED wheezing AND SHORTNESS OF BREATH, Disp: 54 g, Rfl: 1   alendronate  (FOSAMAX ) 70 MG tablet, Take 1 tablet (70 mg total) by mouth every 7 (seven) days. Take with a full glass of water on an empty stomach., Disp: 12 tablet, Rfl: 0   aspirin  EC 81 MG tablet, Take 81 mg by mouth in the morning., Disp: , Rfl:    atorvastatin  (LIPITOR ) 80 MG tablet, Take 1 tablet (80 mg total) by mouth  daily., Disp: 90 tablet, Rfl: 3   azithromycin  (ZITHROMAX  Z-PAK) 250 MG tablet, Take 2 tablets (500 mg total) by mouth daily for 1 day, THEN 1 tablet (250 mg total) daily for 4 days., Disp: 6 tablet, Rfl: 0   busPIRone  (BUSPAR ) 15 MG tablet, Take 2 tablets (30 mg total) by mouth in the morning AND 1 tablet (15 mg total) daily at 12 noon AND 1 tablet (15 mg total) every evening., Disp: 360 tablet, Rfl: 0   clonazePAM  (KLONOPIN ) 0.5 MG tablet, Take 1 tablet (0.5 mg total) by mouth 2 (two) times daily as needed., Disp: 60 tablet, Rfl: 0   doxepin  (SINEQUAN ) 75 MG capsule, Take 1 capsule (75 mg total) by mouth at bedtime., Disp: 90 capsule, Rfl: 0   escitalopram  (LEXAPRO ) 20 MG tablet, Take 1 tablet (20 mg total) by mouth daily., Disp: 90 tablet, Rfl: 0   ezetimibe  (ZETIA ) 10 MG tablet, Take 1 tablet (10 mg total) by mouth daily., Disp: 90 tablet, Rfl: 3   Fluticasone -Umeclidin-Vilant (TRELEGY ELLIPTA ) 200-62.5-25 MCG/ACT AEPB, Inhale 1 puff into the lungs daily., Disp: 60 each, Rfl: 11   furosemide  (LASIX ) 40 MG tablet, Take 1 tablet (40 mg total) by mouth daily., Disp: 90 tablet, Rfl: 1   gabapentin  (NEURONTIN ) 600 MG tablet, Take 1 tablet (600 mg total) by mouth 2 (two) times daily., Disp: 180 tablet, Rfl: 2   hydrOXYzine  (ATARAX ) 25 MG tablet, Take 1 tablet (25 mg total) by mouth at bedtime as needed., Disp: 30 tablet, Rfl: 2   ipratropium-albuterol  (DUONEB) 0.5-2.5 (3) MG/3ML SOLN, Take 3 mLs by nebulization every 8 (eight) hours., Disp: 90 mL, Rfl: 1   levothyroxine  (SYNTHROID ) 112 MCG tablet, Take 1 tablet (112 mcg total) by mouth daily., Disp: 90 tablet, Rfl: 2   metFORMIN  (GLUCOPHAGE ) 500 MG tablet, Take 1 tablet (500 mg total) by mouth 2 (two) times daily with a meal., Disp: 180 tablet, Rfl: 1   mirtazapine  (REMERON ) 45  MG tablet, Take 1 tablet (45 mg total) by mouth at bedtime., Disp: 90 tablet, Rfl: 0   montelukast  (SINGULAIR ) 10 MG tablet, Take 1 tablet (10 mg total) by mouth at bedtime.,  Disp: 90 tablet, Rfl: 2   nebivolol  (BYSTOLIC ) 10 MG tablet, Take 1 tablet (10 mg total) by mouth daily., Disp: 90 tablet, Rfl: 3   nitroGLYCERIN  (NITROSTAT ) 0.4 MG SL tablet, Place 1 tablet (0.4 mg total) under the tongue every 5 (five) minutes as needed for chest pain., Disp: 25 tablet, Rfl: 3   omeprazole  (PRILOSEC) 40 MG capsule, Take 1 capsule (40 mg total) by mouth 2 (two) times daily before a meal., Disp: 60 capsule, Rfl: 1   OXYGEN , Inhale 2-3 L/min into the lungs continuous., Disp: , Rfl:    predniSONE  (DELTASONE ) 20 MG tablet, Take 1 tablet (20 mg total) by mouth daily with breakfast., Disp: 7 tablet, Rfl: 0   ticagrelor  (BRILINTA ) 60 MG TABS tablet, Take 1 tablet (60 mg total) by mouth 2 (two) times daily., Disp: 180 tablet, Rfl: 2   varenicline  (CHANTIX ) 1 MG tablet, Take 1 tablet (1 mg total) by mouth 2 (two) times daily., Disp: 60 tablet, Rfl: 3

## 2024-04-13 NOTE — Patient Instructions (Addendum)
 Resume azithromycin  250mg , 1 tablet daily  Start prednisone  taper  Continue to work on quitting smoking using chantix  twice daily, patches and as needed lozenges  Continue trelegy inhaler 1 puff daily - rinse mouth out after each use  Continue Ohtuvayre  nebulizer treatments twice daily  Continue albuterol  inhaler 1-2 puffs every 4-6 hours as needed  Follow up in 3 months

## 2024-04-14 ENCOUNTER — Telehealth (HOSPITAL_COMMUNITY): Payer: Self-pay | Admitting: Emergency Medicine

## 2024-04-14 ENCOUNTER — Other Ambulatory Visit: Payer: Self-pay

## 2024-04-14 NOTE — Telephone Encounter (Signed)
 LVM reminding Ms. Cornwall of home visit @ 1:00 tomorrow.    Mary Sharps, EMT-Paramedic [phone] 04/14/2024

## 2024-04-15 ENCOUNTER — Other Ambulatory Visit (HOSPITAL_COMMUNITY): Payer: Self-pay | Admitting: Emergency Medicine

## 2024-04-15 ENCOUNTER — Other Ambulatory Visit: Payer: Self-pay

## 2024-04-15 ENCOUNTER — Other Ambulatory Visit (HOSPITAL_COMMUNITY): Payer: Self-pay

## 2024-04-15 ENCOUNTER — Other Ambulatory Visit: Payer: Self-pay | Admitting: Nurse Practitioner

## 2024-04-15 DIAGNOSIS — I2510 Atherosclerotic heart disease of native coronary artery without angina pectoris: Secondary | ICD-10-CM

## 2024-04-15 MED ORDER — NITROGLYCERIN 0.4 MG SL SUBL
0.4000 mg | SUBLINGUAL_TABLET | SUBLINGUAL | 3 refills | Status: AC | PRN
  Filled 2024-04-15: qty 25, 4d supply, fill #0

## 2024-04-15 NOTE — Progress Notes (Signed)
 Paramedicine Encounter    Patient ID: [name] [name], female    DOB: 11-23-1955, 68 y.o.   MRN: [id number]   Complaints Cough  Assessment A&O x 4, skin W&D w/ good color.  Denies chest pain or SOB.  Lung sounds w/ expiratory wheezes bilat in all lobes.  She states she will do a breathing treatment at the end of home visit today.  She is on a prednisone  dose pack and a Z-Pak.  Compliance with meds YES  Pill box filled - by daughter  Refills needed NONE  Meds changes since last visit  Prednisone  dose pack, and a Z-Pak    Social changes NONE   BP 130/64 (BP Location: Left Arm, Patient Position: Sitting, Cuff Size: Normal)   Pulse 84   Resp 16   Wt 160 lb 6.4 oz (72.8 kg)   SpO2 97% Comment: w/ O2 @ 2lpm via cannula  BMI 30.31 kg/m  Weight yesterday- not taken Last visit weight-160.8lb  ACTION: Home visit completed  [name] [phone] 04/15/24  Patient Care Team: Vicci Barnie [name], MD as PCP - General (Internal Medicine) Burnard Debby LABOR, MD (Inactive) as PCP - Cardiology (Cardiology) Shaaron Lamar HERO, MD as Consulting Physician (Gastroenterology) Onesimo Emaline Brink, MD as Consulting Physician (Hematology) Collene Reginia BRAVO, [place] as Physician Assistant (Physician Assistant) Carolee Heron [name], RN as Case Manager  Patient Active Problem List   Diagnosis Date Noted  . Hypokalemia 01/08/2024  . Acute on chronic heart failure with preserved ejection fraction (HFpEF, >= 50%) (HCC) 12/05/2023  . At risk for polypharmacy 12/04/2023  . Long-term current use of antidepressant 11/17/2023  . Long-term current use of benzodiazepine 11/17/2023  . Sigmoid diverticulitis 11/12/2023  . Acute kidney injury superimposed on chronic kidney disease 11/12/2023  . CKD (chronic kidney disease) stage 2, GFR 60-89 ml/min 11/12/2023  . Sepsis secondary to sigmoid diverticulitis (HCC) 11/12/2023  . Acute on chronic hypoxic respiratory failure (HCC) 10/05/2023  . Pneumothorax,  right 09/16/2023  . COPD with acute exacerbation (HCC) 09/01/2023  . Chronic hypoxic respiratory failure (HCC) 09/01/2023  . Continuous dependence on cigarette smoking 09/01/2023  . History of CAD (coronary artery disease) 09/01/2023  . CAP (community acquired pneumonia) 09/01/2023  . Full code status 06/14/2023  . Acute hypoxic respiratory failure (HCC) 06/13/2023  . Acute exacerbation of chronic obstructive pulmonary disease (COPD) (HCC) 06/12/2023  . Nicotine  dependence 05/03/2023  . Substance abuse (HCC) 09/10/2022  . Iron  deficiency anemia 11/02/2021  . Major depressive disorder, recurrent, in full remission with anxious distress 05/31/2021  . Chronic neck pain 02/26/2021  . GAD (generalized anxiety disorder) 01/08/2021  . Esophageal dysphagia 04/28/2019  . Bipolar disorder (HCC) 09/17/2018  . Insomnia 10/28/2017  . Prediabetes 05/28/2017  . QT prolongation 03/25/2017  . Psoriasis 06/22/2015  . COPD (chronic obstructive pulmonary disease) (HCC)GOLD E 05/18/2015  . Anxiety and depression 07/10/2013  . Essential hypertension 06/07/2013  . Hyperlipidemia 06/07/2013  . Asthma, chronic 06/07/2013  . Emphysema lung (HCC) 06/07/2013  . Chronic back pain 06/07/2013  . CAD S/P percutaneous coronary angioplasty 06/07/2013  . GERD (gastroesophageal reflux disease) 06/07/2013  . Breast lump on left side at 3 o'clock position 10/13/2012  . S/P abdominal hysterectomy and right salpingo-oophorectomy 08/29/2011  . COPD exacerbation (HCC) 09/15/2009  . TOBACCO ABUSE 07/17/2009  . Chronic rhinitis 07/17/2009  . Lung nodule < 6cm on CT 07/17/2009  . Hypothyroidism 12/22/2008  . Chronic depression 12/22/2008    Current Outpatient Medications:  .  albuterol  (  VENTOLIN  HFA) 108 (90 Base) MCG/ACT inhaler, INHALE 2 PUFFS into THE lungs EVERY 6 HOURS AS NEEDED wheezing AND SHORTNESS OF BREATH, Disp: 54 g, Rfl: 1 .  alendronate  (FOSAMAX ) 70 MG tablet, Take 1 tablet (70 mg total) by mouth every 7  (seven) days. Take with a full glass of water on an empty stomach., Disp: 12 tablet, Rfl: 0 .  aspirin  EC 81 MG tablet, Take 81 mg by mouth in the morning., Disp: , Rfl:  .  atorvastatin  (LIPITOR ) 80 MG tablet, Take 1 tablet (80 mg total) by mouth daily., Disp: 90 tablet, Rfl: 3 .  azithromycin  (ZITHROMAX ) 250 MG tablet, Take 1 tablet (250 mg total) by mouth daily., Disp: 90 tablet, Rfl: 1 .  busPIRone  (BUSPAR ) 15 MG tablet, Take 2 tablets (30 mg total) by mouth in the morning AND 1 tablet (15 mg total) daily at 12 noon AND 1 tablet (15 mg total) every evening., Disp: 360 tablet, Rfl: 0 .  clonazePAM  (KLONOPIN ) 0.5 MG tablet, Take 1 tablet (0.5 mg total) by mouth 2 (two) times daily as needed., Disp: 60 tablet, Rfl: 0 .  doxepin  (SINEQUAN ) 75 MG capsule, Take 1 capsule (75 mg total) by mouth at bedtime., Disp: 90 capsule, Rfl: 0 .  escitalopram  (LEXAPRO ) 20 MG tablet, Take 1 tablet (20 mg total) by mouth daily., Disp: 90 tablet, Rfl: 0 .  ezetimibe  (ZETIA ) 10 MG tablet, Take 1 tablet (10 mg total) by mouth daily., Disp: 90 tablet, Rfl: 3 .  Fluticasone -Umeclidin-Vilant (TRELEGY ELLIPTA ) 200-62.5-25 MCG/ACT AEPB, Inhale 1 puff into the lungs daily., Disp: 60 each, Rfl: 11 .  furosemide  (LASIX ) 40 MG tablet, Take 1 tablet (40 mg total) by mouth daily., Disp: 90 tablet, Rfl: 1 .  gabapentin  (NEURONTIN ) 600 MG tablet, Take 1 tablet (600 mg total) by mouth 2 (two) times daily., Disp: 180 tablet, Rfl: 2 .  hydrOXYzine  (ATARAX ) 25 MG tablet, Take 1 tablet (25 mg total) by mouth at bedtime as needed., Disp: 30 tablet, Rfl: 2 .  ipratropium-albuterol  (DUONEB) 0.5-2.5 (3) MG/3ML SOLN, Take 3 mLs by nebulization every 8 (eight) hours. (Patient taking differently: Take 3 mLs by nebulization every 8 (eight) hours.), Disp: 90 mL, Rfl: 1 .  levothyroxine  (SYNTHROID ) 112 MCG tablet, Take 1 tablet (112 mcg total) by mouth daily., Disp: 90 tablet, Rfl: 2 .  metFORMIN  (GLUCOPHAGE ) 500 MG tablet, Take 1 tablet (500 mg  total) by mouth 2 (two) times daily with a meal., Disp: 180 tablet, Rfl: 1 .  mirtazapine  (REMERON ) 45 MG tablet, Take 1 tablet (45 mg total) by mouth at bedtime., Disp: 90 tablet, Rfl: 0 .  montelukast  (SINGULAIR ) 10 MG tablet, Take 1 tablet (10 mg total) by mouth at bedtime., Disp: 90 tablet, Rfl: 2 .  nebivolol  (BYSTOLIC ) 10 MG tablet, Take 1 tablet (10 mg total) by mouth daily., Disp: 90 tablet, Rfl: 3 .  nitroGLYCERIN  (NITROSTAT ) 0.4 MG SL tablet, Place 1 tablet (0.4 mg total) under the tongue every 5 (five) minutes as needed for chest pain., Disp: 25 tablet, Rfl: 3 .  omeprazole  (PRILOSEC) 40 MG capsule, Take 1 capsule (40 mg total) by mouth 2 (two) times daily before a meal., Disp: 60 capsule, Rfl: 1 .  OXYGEN , Inhale 2-3 L/min into the lungs continuous., Disp: , Rfl:  .  predniSONE  (DELTASONE ) 10 MG tablet, Take 4 tablets (40 mg total) by mouth daily with breakfast for 3 days, THEN 3 tablets (30 mg total) daily with breakfast for 3 days, THEN 2  tablets (20 mg total) daily with breakfast for 3 days, THEN 1 tablet (10 mg total) daily with breakfast for 3 days., Disp: 30 tablet, Rfl: 0 .  ticagrelor  (BRILINTA ) 60 MG TABS tablet, Take 1 tablet (60 mg total) by mouth 2 (two) times daily., Disp: 180 tablet, Rfl: 2 .  varenicline  (CHANTIX ) 1 MG tablet, Take 1 tablet (1 mg total) by mouth 2 (two) times daily., Disp: 60 tablet, Rfl: 3 Allergies  Allergen Reactions  . Avocado Anaphylaxis  . Latex Shortness Of Breath and Rash  . Codeine  Nausea Only     Social History   Socioeconomic History  . Marital status: Divorced    Spouse name: Not on file  . Number of children: 1  . Years of education: Not on file  . Highest education level: 9th grade  Occupational History  . Not on file  Tobacco Use  . Smoking status: Every Day    Current packs/day: 0.50    Average packs/day: 0.5 packs/day for 50.0 years (25.0 ttl pk-yrs)    Types: Cigarettes  . Smokeless tobacco: Never  Vaping Use  . Vaping  status: Some Days  . Substances: Nicotine   Substance and Sexual Activity  . Alcohol use: Yes    Comment: once a week  . Drug use: Yes    Types: Marijuana    Comment: history of cocaine use, been about a year, per patient (12/18/21)  . Sexual activity: Not Currently    Birth control/protection: Surgical  Other Topics Concern  . Not on file  Social History Narrative  . Not on file   Social Drivers of Health   Financial Resource Strain: Medium Risk (08/07/2023)   Overall Financial Resource Strain (CARDIA)   . Difficulty of Paying Living Expenses: Somewhat hard  Food Insecurity: No Food Insecurity (01/12/2024)   Hunger Vital Sign   . Worried About Programme researcher, broadcasting/film/video in the Last Year: Never true   . Ran Out of Food in the Last Year: Never true  Transportation Needs: No Transportation Needs (01/12/2024)   PRAPARE - Transportation   . Lack of Transportation (Medical): No   . Lack of Transportation (Non-Medical): No  Physical Activity: Inactive (08/07/2023)   Exercise Vital Sign   . Days of Exercise per Week: 0 days   . Minutes of Exercise per Session: 0 min  Stress: No Stress Concern Present (08/07/2023)   [employer] of Occupational Health - Occupational Stress Questionnaire   . Feeling of Stress : Only a little  Social Connections: Moderately Integrated (01/09/2024)   Social Connection and Isolation Panel   . Frequency of Communication with Friends and Family: More than three times a week   . Frequency of Social Gatherings with Friends and Family: More than three times a week   . Attends Religious Services: 1 to 4 times per year   . Active Member of Clubs or Organizations: No   . Attends Banker Meetings: 1 to 4 times per year   . Marital Status: Never married  Recent Concern: Social Connections - Moderately Isolated (12/04/2023)   Social Connection and Isolation Panel   . Frequency of Communication with Friends and Family: Twice a week   . Frequency of Social  Gatherings with Friends and Family: Never   . Attends Religious Services: 1 to 4 times per year   . Active Member of Clubs or Organizations: No   . Attends Banker Meetings: 1 to 4 times per year   .  Marital Status: Never married  Intimate Partner Violence: Not At Risk (01/12/2024)   Humiliation, Afraid, Rape, and Kick questionnaire   . Fear of Current or Ex-Partner: No   . Emotionally Abused: No   . Physically Abused: No   . Sexually Abused: No    Physical Exam      Future Appointments  Date Time Provider Department Center  04/28/2024  1:30 PM [facility] None  04/28/2024  2:00 PM Onesimo Emaline Brink, MD [facility] None  05/18/2024  4:00 PM Collene Reginia BRAVO, PA [facility] None  05/24/2024  4:00 PM Kara Dorn NOVAK, MD [facility] [address]  06/21/2024  4:10 PM Vicci Barnie NOVAK, MD [facility] [address]  06/22/2024  4:10 PM [facility] VISIT [facility] [address]  07/13/2024  4:00 PM Floretta Mallard, MD [facility]

## 2024-04-17 ENCOUNTER — Other Ambulatory Visit (HOSPITAL_COMMUNITY): Payer: Self-pay

## 2024-04-20 ENCOUNTER — Encounter: Payer: Self-pay | Admitting: Internal Medicine

## 2024-04-21 ENCOUNTER — Other Ambulatory Visit: Payer: Self-pay

## 2024-04-21 ENCOUNTER — Other Ambulatory Visit (HOSPITAL_COMMUNITY): Payer: Self-pay

## 2024-04-21 ENCOUNTER — Other Ambulatory Visit: Payer: Self-pay | Admitting: Pulmonary Disease

## 2024-04-21 ENCOUNTER — Other Ambulatory Visit: Payer: Self-pay | Admitting: Gastroenterology

## 2024-04-21 DIAGNOSIS — F17210 Nicotine dependence, cigarettes, uncomplicated: Secondary | ICD-10-CM

## 2024-04-21 DIAGNOSIS — K219 Gastro-esophageal reflux disease without esophagitis: Secondary | ICD-10-CM

## 2024-04-21 MED ORDER — OMEPRAZOLE 40 MG PO CPDR
40.0000 mg | DELAYED_RELEASE_CAPSULE | Freq: Two times a day (BID) | ORAL | 0 refills | Status: AC
Start: 2024-04-21 — End: ?
  Filled 2024-04-21: qty 60, 30d supply, fill #0

## 2024-04-21 MED ORDER — VARENICLINE TARTRATE 1 MG PO TABS
1.0000 mg | ORAL_TABLET | Freq: Two times a day (BID) | ORAL | 3 refills | Status: DC
  Filled 2024-04-21: qty 60, 30d supply, fill #0
  Filled 2024-05-21: qty 60, 30d supply, fill #1
  Filled 2024-06-20: qty 60, 30d supply, fill #2
  Filled 2024-07-14: qty 60, 30d supply, fill #3

## 2024-04-22 ENCOUNTER — Telehealth: Payer: Self-pay

## 2024-04-22 ENCOUNTER — Other Ambulatory Visit (HOSPITAL_COMMUNITY): Payer: Self-pay | Admitting: Emergency Medicine

## 2024-04-22 NOTE — Progress Notes (Unsigned)
 Paramedicine Encounter    Patient ID: [name] [name], female    DOB: Sep 19, 1955, 68 y.o.   MRN: [id number]   Complaints NONE  Assessment A&O x 4, skin W&D w/ good color.  Lung sounds w/ some baseline ronchi noted.  No wheezing.  Compliance with meds YES  Pill box filled by daughter  Refills needed Nicotine  patches  Meds changes since last visit NONE    Social changes NONE   BP 120/70 (BP Location: Left Arm, Patient Position: Sitting, Cuff Size: Normal)   Pulse 68   Resp 18   Wt 160 lb (72.6 kg)   SpO2 98%   BMI 30.23 kg/m  Weight yesterday- not taken Last visit weight-160.4lb  ATF [name] [name] advises she is feeling well today.  She denies chest pain or any new SOB.  Lung sounds w/ some ronchi noted, no wheezing.  Compliant w/ all meds.  Reviewed meds w/ pt and she if familiar w/ each and dosing.  She did receive her Ntg prescription that was ordered last visit.   ACTION: Home visit completed  [name] [name] [phone] 04/22/24  Patient Care Team: Vicci Barnie [name], MD as PCP - General (Internal Medicine) Burnard Debby LABOR, MD (Inactive) as PCP - Cardiology (Cardiology) Shaaron Lamar HERO, MD as Consulting Physician (Gastroenterology) Onesimo Emaline Brink, MD as Consulting Physician (Hematology) Collene Reginia BRAVO, [place] as Physician Assistant (Physician Assistant) Carolee Heron [name], RN as Case Manager  Patient Active Problem List   Diagnosis Date Noted  . Hypokalemia 01/08/2024  . Acute on chronic heart failure with preserved ejection fraction (HFpEF, >= 50%) (HCC) 12/05/2023  . At risk for polypharmacy 12/04/2023  . Long-term current use of antidepressant 11/17/2023  . Long-term current use of benzodiazepine 11/17/2023  . Sigmoid diverticulitis 11/12/2023  . Acute kidney injury superimposed on chronic kidney disease 11/12/2023  . CKD (chronic kidney disease) stage 2, GFR 60-89 ml/min 11/12/2023  . Sepsis secondary to sigmoid diverticulitis (HCC) 11/12/2023   . Acute on chronic hypoxic respiratory failure (HCC) 10/05/2023  . Pneumothorax, right 09/16/2023  . COPD with acute exacerbation (HCC) 09/01/2023  . Chronic hypoxic respiratory failure (HCC) 09/01/2023  . Continuous dependence on cigarette smoking 09/01/2023  . History of CAD (coronary artery disease) 09/01/2023  . CAP (community acquired pneumonia) 09/01/2023  . Full code status 06/14/2023  . Acute hypoxic respiratory failure (HCC) 06/13/2023  . Acute exacerbation of chronic obstructive pulmonary disease (COPD) (HCC) 06/12/2023  . Nicotine  dependence 05/03/2023  . Substance abuse (HCC) 09/10/2022  . Iron  deficiency anemia 11/02/2021  . Major depressive disorder, recurrent, in full remission with anxious distress 05/31/2021  . Chronic neck pain 02/26/2021  . GAD (generalized anxiety disorder) 01/08/2021  . Esophageal dysphagia 04/28/2019  . Bipolar disorder (HCC) 09/17/2018  . Insomnia 10/28/2017  . Prediabetes 05/28/2017  . QT prolongation 03/25/2017  . Psoriasis 06/22/2015  . COPD (chronic obstructive pulmonary disease) (HCC)GOLD E 05/18/2015  . Anxiety and depression 07/10/2013  . Essential hypertension 06/07/2013  . Hyperlipidemia 06/07/2013  . Asthma, chronic 06/07/2013  . Emphysema lung (HCC) 06/07/2013  . Chronic back pain 06/07/2013  . CAD S/P percutaneous coronary angioplasty 06/07/2013  . GERD (gastroesophageal reflux disease) 06/07/2013  . Breast lump on left side at 3 o'clock position 10/13/2012  . S/P abdominal hysterectomy and right salpingo-oophorectomy 08/29/2011  . COPD exacerbation (HCC) 09/15/2009  . TOBACCO ABUSE 07/17/2009  . Chronic rhinitis 07/17/2009  . Lung nodule < 6cm on CT 07/17/2009  . Hypothyroidism 12/22/2008  .  Chronic depression 12/22/2008    Current Outpatient Medications:  .  albuterol  (VENTOLIN  HFA) 108 (90 Base) MCG/ACT inhaler, INHALE 2 PUFFS into THE lungs EVERY 6 HOURS AS NEEDED wheezing AND SHORTNESS OF BREATH, Disp: 54 g, Rfl:  1 .  alendronate  (FOSAMAX ) 70 MG tablet, Take 1 tablet (70 mg total) by mouth every 7 (seven) days. Take with a full glass of water on an empty stomach., Disp: 12 tablet, Rfl: 0 .  aspirin  EC 81 MG tablet, Take 81 mg by mouth in the morning., Disp: , Rfl:  .  atorvastatin  (LIPITOR ) 80 MG tablet, Take 1 tablet (80 mg total) by mouth daily., Disp: 90 tablet, Rfl: 3 .  azithromycin  (ZITHROMAX ) 250 MG tablet, Take 1 tablet (250 mg total) by mouth daily., Disp: 90 tablet, Rfl: 1 .  busPIRone  (BUSPAR ) 15 MG tablet, Take 2 tablets (30 mg total) by mouth in the morning AND 1 tablet (15 mg total) daily at 12 noon AND 1 tablet (15 mg total) every evening., Disp: 360 tablet, Rfl: 0 .  clonazePAM  (KLONOPIN ) 0.5 MG tablet, Take 1 tablet (0.5 mg total) by mouth 2 (two) times daily as needed., Disp: 60 tablet, Rfl: 0 .  doxepin  (SINEQUAN ) 75 MG capsule, Take 1 capsule (75 mg total) by mouth at bedtime., Disp: 90 capsule, Rfl: 0 .  escitalopram  (LEXAPRO ) 20 MG tablet, Take 1 tablet (20 mg total) by mouth daily., Disp: 90 tablet, Rfl: 0 .  ezetimibe  (ZETIA ) 10 MG tablet, Take 1 tablet (10 mg total) by mouth daily., Disp: 90 tablet, Rfl: 3 .  Fluticasone -Umeclidin-Vilant (TRELEGY ELLIPTA ) 200-62.5-25 MCG/ACT AEPB, Inhale 1 puff into the lungs daily., Disp: 60 each, Rfl: 11 .  furosemide  (LASIX ) 40 MG tablet, Take 1 tablet (40 mg total) by mouth daily., Disp: 90 tablet, Rfl: 1 .  gabapentin  (NEURONTIN ) 600 MG tablet, Take 1 tablet (600 mg total) by mouth 2 (two) times daily., Disp: 180 tablet, Rfl: 2 .  hydrOXYzine  (ATARAX ) 25 MG tablet, Take 1 tablet (25 mg total) by mouth at bedtime as needed., Disp: 30 tablet, Rfl: 2 .  ipratropium-albuterol  (DUONEB) 0.5-2.5 (3) MG/3ML SOLN, Take 3 mLs by nebulization every 8 (eight) hours. (Patient taking differently: Take 3 mLs by nebulization every 8 (eight) hours.), Disp: 90 mL, Rfl: 1 .  levothyroxine  (SYNTHROID ) 112 MCG tablet, Take 1 tablet (112 mcg total) by mouth daily.,  Disp: 90 tablet, Rfl: 2 .  metFORMIN  (GLUCOPHAGE ) 500 MG tablet, Take 1 tablet (500 mg total) by mouth 2 (two) times daily with a meal., Disp: 180 tablet, Rfl: 1 .  mirtazapine  (REMERON ) 45 MG tablet, Take 1 tablet (45 mg total) by mouth at bedtime., Disp: 90 tablet, Rfl: 0 .  montelukast  (SINGULAIR ) 10 MG tablet, Take 1 tablet (10 mg total) by mouth at bedtime., Disp: 90 tablet, Rfl: 2 .  nebivolol  (BYSTOLIC ) 10 MG tablet, Take 1 tablet (10 mg total) by mouth daily., Disp: 90 tablet, Rfl: 3 .  nitroGLYCERIN  (NITROSTAT ) 0.4 MG SL tablet, Place 1 tablet (0.4 mg total) under the tongue every 5 (five) minutes as needed for chest pain., Disp: 25 tablet, Rfl: 3 .  omeprazole  (PRILOSEC) 40 MG capsule, Take 1 capsule (40 mg total) by mouth 2 (two) times daily before a meal. NEED APPOINTMENT FOR ADDITIONAL REFILLS, Disp: 60 capsule, Rfl: 0 .  OXYGEN , Inhale 2-3 L/min into the lungs continuous., Disp: , Rfl:  .  predniSONE  (DELTASONE ) 10 MG tablet, Take 4 tablets (40 mg total) by mouth daily with  breakfast for 3 days, THEN 3 tablets (30 mg total) daily with breakfast for 3 days, THEN 2 tablets (20 mg total) daily with breakfast for 3 days, THEN 1 tablet (10 mg total) daily with breakfast for 3 days., Disp: 30 tablet, Rfl: 0 .  ticagrelor  (BRILINTA ) 60 MG TABS tablet, Take 1 tablet (60 mg total) by mouth 2 (two) times daily., Disp: 180 tablet, Rfl: 2 .  varenicline  (CHANTIX ) 1 MG tablet, Take 1 tablet (1 mg total) by mouth 2 (two) times daily., Disp: 60 tablet, Rfl: 3 Allergies  Allergen Reactions  . Avocado Anaphylaxis  . Latex Shortness Of Breath and Rash  . Codeine  Nausea Only     Social History   Socioeconomic History  . Marital status: Divorced    Spouse name: Not on file  . Number of children: 1  . Years of education: Not on file  . Highest education level: 9th grade  Occupational History  . Not on file  Tobacco Use  . Smoking status: Every Day    Current packs/day: 0.50    Average  packs/day: 0.5 packs/day for 50.0 years (25.0 ttl pk-yrs)    Types: Cigarettes  . Smokeless tobacco: Never  Vaping Use  . Vaping status: Some Days  . Substances: Nicotine   Substance and Sexual Activity  . Alcohol use: Yes    Comment: once a week  . Drug use: Yes    Types: Marijuana    Comment: history of cocaine use, been about a year, per patient (12/18/21)  . Sexual activity: Not Currently    Birth control/protection: Surgical  Other Topics Concern  . Not on file  Social History Narrative  . Not on file   Social Drivers of Health   Financial Resource Strain: Medium Risk (08/07/2023)   Overall Financial Resource Strain (CARDIA)   . Difficulty of Paying Living Expenses: Somewhat hard  Food Insecurity: No Food Insecurity (01/12/2024)   Hunger Vital Sign   . Worried About Programme researcher, broadcasting/film/video in the Last Year: Never true   . Ran Out of Food in the Last Year: Never true  Transportation Needs: No Transportation Needs (01/12/2024)   PRAPARE - Transportation   . Lack of Transportation (Medical): No   . Lack of Transportation (Non-Medical): No  Physical Activity: Inactive (08/07/2023)   Exercise Vital Sign   . Days of Exercise per Week: 0 days   . Minutes of Exercise per Session: 0 min  Stress: No Stress Concern Present (08/07/2023)   [employer] of Occupational Health - Occupational Stress Questionnaire   . Feeling of Stress : Only a little  Social Connections: Moderately Integrated (01/09/2024)   Social Connection and Isolation Panel   . Frequency of Communication with Friends and Family: More than three times a week   . Frequency of Social Gatherings with Friends and Family: More than three times a week   . Attends Religious Services: 1 to 4 times per year   . Active Member of Clubs or Organizations: No   . Attends Banker Meetings: 1 to 4 times per year   . Marital Status: Never married  Recent Concern: Social Connections - Moderately Isolated (12/04/2023)    Social Connection and Isolation Panel   . Frequency of Communication with Friends and Family: Twice a week   . Frequency of Social Gatherings with Friends and Family: Never   . Attends Religious Services: 1 to 4 times per year   . Active Member of Clubs or Organizations:  No   . Attends Club or Organization Meetings: 1 to 4 times per year   . Marital Status: Never married  Intimate Partner Violence: Not At Risk (01/12/2024)   Humiliation, Afraid, Rape, and Kick questionnaire   . Fear of Current or Ex-Partner: No   . Emotionally Abused: No   . Physically Abused: No   . Sexually Abused: No    Physical Exam      Future Appointments  Date Time Provider Department Center  04/28/2024  1:30 PM [facility] None  04/28/2024  2:00 PM Onesimo Emaline Brink, MD [facility] None  05/18/2024  4:00 PM Collene Reginia BRAVO, PA [facility] None  05/24/2024  4:00 PM Kara Dorn NOVAK, MD [facility] [address]  06/21/2024  4:10 PM Vicci Barnie NOVAK, MD [facility] [address]  06/22/2024  4:10 PM [facility] VISIT [facility] [address]  07/13/2024  4:00 PM Floretta Mallard, MD [facility]

## 2024-04-22 NOTE — Telephone Encounter (Signed)
 I spoke to patient's daughter, [name], who confirmed that she has submitted Part A of the [employer] [place] application.   I completed Part B and emailed it to Courtney Rorie / [employer]

## 2024-04-26 ENCOUNTER — Other Ambulatory Visit (HOSPITAL_COMMUNITY): Payer: Self-pay

## 2024-04-28 ENCOUNTER — Inpatient Hospital Stay: Attending: Hematology

## 2024-04-28 ENCOUNTER — Inpatient Hospital Stay (HOSPITAL_BASED_OUTPATIENT_CLINIC_OR_DEPARTMENT_OTHER): Admitting: Hematology

## 2024-04-28 VITALS — BP 142/70 | HR 79 | Temp 97.7°F | Resp 18 | Wt 166.0 lb

## 2024-04-28 DIAGNOSIS — D75839 Thrombocytosis, unspecified: Secondary | ICD-10-CM | POA: Insufficient documentation

## 2024-04-28 DIAGNOSIS — E611 Iron deficiency: Secondary | ICD-10-CM | POA: Diagnosis not present

## 2024-04-28 DIAGNOSIS — Z800 Family history of malignant neoplasm of digestive organs: Secondary | ICD-10-CM | POA: Insufficient documentation

## 2024-04-28 DIAGNOSIS — F17210 Nicotine dependence, cigarettes, uncomplicated: Secondary | ICD-10-CM | POA: Diagnosis not present

## 2024-04-28 DIAGNOSIS — D509 Iron deficiency anemia, unspecified: Secondary | ICD-10-CM

## 2024-04-28 DIAGNOSIS — Z803 Family history of malignant neoplasm of breast: Secondary | ICD-10-CM | POA: Insufficient documentation

## 2024-04-28 LAB — CMP (CANCER CENTER ONLY)
ALT: 13 U/L (ref 0–44)
AST: 13 U/L — ABNORMAL LOW (ref 15–41)
Albumin: 3.7 g/dL (ref 3.5–5.0)
Alkaline Phosphatase: 75 U/L (ref 38–126)
Anion gap: 5 (ref 5–15)
BUN: 12 mg/dL (ref 8–23)
CO2: 37 mmol/L — ABNORMAL HIGH (ref 22–32)
Calcium: 9.4 mg/dL (ref 8.9–10.3)
Chloride: 98 mmol/L (ref 98–111)
Creatinine: 0.81 mg/dL (ref 0.44–1.00)
GFR, Estimated: >60 mL/min (ref >60)
Glucose, Bld: 148 mg/dL — ABNORMAL HIGH (ref 70–99)
Potassium: 3.6 mmol/L (ref 3.5–5.1)
Sodium: 140 mmol/L (ref 135–145)
Total Bilirubin: 0.2 mg/dL (ref 0.0–1.2)
Total Protein: 6.1 g/dL — ABNORMAL LOW (ref 6.5–8.1)

## 2024-04-28 LAB — CBC WITH DIFFERENTIAL (CANCER CENTER ONLY)
Abs Immature Granulocytes: 0.07 K/uL (ref 0.00–0.07)
Basophils Absolute: 0 K/uL (ref 0.0–0.1)
Basophils Relative: 0 %
Eosinophils Absolute: 0.1 K/uL (ref 0.0–0.5)
Eosinophils Relative: 1 %
HCT: 36.8 % (ref 36.0–46.0)
Hemoglobin: 11.7 g/dL — ABNORMAL LOW (ref 12.0–15.0)
Immature Granulocytes: 1 %
Lymphocytes Relative: 19 %
Lymphs Abs: 2.3 K/uL (ref 0.7–4.0)
MCH: 28.4 pg (ref 26.0–34.0)
MCHC: 31.8 g/dL (ref 30.0–36.0)
MCV: 89.3 fL (ref 80.0–100.0)
Monocytes Absolute: 0.8 K/uL (ref 0.1–1.0)
Monocytes Relative: 7 %
Neutro Abs: 8.8 K/uL — ABNORMAL HIGH (ref 1.7–7.7)
Neutrophils Relative %: 72 %
Platelet Count: 466 K/uL — ABNORMAL HIGH (ref 150–400)
RBC: 4.12 MIL/uL (ref 3.87–5.11)
RDW: 15.9 % — ABNORMAL HIGH (ref 11.5–15.5)
WBC Count: 12.2 K/uL — ABNORMAL HIGH (ref 4.0–10.5)
nRBC: 0 % (ref 0.0–0.2)

## 2024-04-28 LAB — FERRITIN: Ferritin: 16 ng/mL (ref 11–307)

## 2024-04-28 LAB — IRON AND IRON BINDING CAPACITY (CC-WL,HP ONLY)
Iron: 76 ug/dL (ref 28–170)
Saturation Ratios: 16 % (ref 10.4–31.8)
TIBC: 477 ug/dL — ABNORMAL HIGH (ref 250–450)
UIBC: 401 ug/dL (ref 148–442)

## 2024-04-28 NOTE — Progress Notes (Signed)
 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 Accompanied by her daughter.   Patient was last seen by me on 03/24/2023 and complained of increased fatigue, but otherwise had not complaints.   Today, she says that she has been fatigued. She reports she is on O2 full time and is still smoking - 8 cigarettes/ day - but is still trying to quit; at one point she was only smoking 4 cigarettes daily. Says that she has tried multiple smoking cessation tools, such as Chantix , patches, lozenges/gum, but these did not help her cravings. She notes that her driving factor for smoking is mainly boredom.   Denies LE edema, but does endorse swelling in her chest and stomach.   She says that in the past, she has taken PO Iron .   MEDICAL HISTORY:  Past Medical History:  Diagnosis Date   Allergy    seasonal   Anemia    Anxiety    takes Lexapro  daily   Arthritis    back, from neck down pass my bra area (03/25/2017)   Asthma    Bartholin gland cyst 08/29/2011   Bruises easily    pt is on Effient    Chronic back pain    herniated nucleus pulposus   Chronic back pain    neck to bra area; lower back (03/25/2017)   Chronic kidney disease    recurrent UTI's this year 2022   COPD (chronic obstructive  pulmonary disease) (HCC)    early stages   Coronary artery disease    Depression    takes Klonopin  daily   Diabetes mellitus without complication (HCC)    Diverticulosis    Fibroadenoma of left breast    GERD (gastroesophageal reflux disease)    takes Nexium  daily   H/O hiatal hernia    Heart attack (HCC) 2011   Hemorrhoids    Hernia    Hyperlipidemia    takes Lipitor  daily   Hypertension    takes Losartan  daily and Labetalol  bid   Hypothyroidism    takes Synthroid  daily   Insomnia    hydroxyzine  prn   Joint pain    Pneumonia    couple times (03/25/2017)   Pre-diabetes    just found out 1 wk ago (03/25/2017)   Psoriasis    elbows,knees,back   Shortness of breath    with exertion   Slowing of urinary stream    Stress incontinence     SURGICAL HISTORY: Past Surgical History:  Procedure Laterality Date   ABDOMINAL EXPLORATION SURGERY  1977   ABDOMINAL HYSTERECTOMY  1977   left one of my ovaries   BACK SURGERY     BIOPSY  05/27/2019   Procedure: BIOPSY;  Surgeon: Shaaron Lamar HERO, MD;  Location: [facility];  Service: Endoscopy;;   BREAST BIOPSY Left 11/2021   x2   COLONOSCOPY  2011  Dr. Teressa: Mild diverticulosis, descending diminutive colon polyp (not retrieved), next colonoscopy 10 years   CORONARY ANGIOPLASTY WITH STENT PLACEMENT  2011 X2   regular stents didn't work; had to go back in in ~ 1 month and put in medicated stents   DILATION AND CURETTAGE OF UTERUS     ESOPHAGOGASTRODUODENOSCOPY     ESOPHAGOGASTRODUODENOSCOPY (EGD) WITH PROPOFOL  N/A 05/27/2019   normal esophagus, dilation, erosive gastropathy s/p biopsy, normal duodenum. Negative H.pylori.    LEFT HEART CATH AND CORONARY ANGIOGRAPHY N/A 03/26/2017   Procedure: LEFT HEART CATH AND CORONARY ANGIOGRAPHY;  Surgeon: Claudene Victory ORN, MD;  Location: [facility];  Service: Cardiovascular;  Laterality: N/A;   LUMBAR LAMINECTOMY/DECOMPRESSION MICRODISCECTOMY  08/05/2011   Procedure: LUMBAR  LAMINECTOMY/DECOMPRESSION MICRODISCECTOMY;  Surgeon: Lynwood JONELLE Mill, MD;  Location: [facility];  Service: Neurosurgery;  Laterality: Right;  Right Lumbar four-five extraforaminal discectomy   MALONEY DILATION N/A 05/27/2019   Procedure: AGAPITO DILATION;  Surgeon: Shaaron Lamar HERO, MD;  Location: [facility];  Service: Endoscopy;  Laterality: N/A;  54   SHOULDER ARTHROSCOPY WITH ROTATOR CUFF REPAIR AND OPEN BICEPS TENODESIS Right 12/24/2021   Procedure: RIGHT SHOULDER ARTHROSCOPY WITH ROTATOR CUFF REPAIR AND BICEPS TENODESIS;  Surgeon: Barbarann Oneil BROCKS, MD;  Location: [facility];  Service: Orthopedics;  Laterality: Right;   TONSILLECTOMY     as a child   UPPER GASTROINTESTINAL ENDOSCOPY      SOCIAL HISTORY: Social History   Socioeconomic History   Marital status: Divorced    Spouse name: Not on file   Number of children: 1   Years of education: Not on file   Highest education level: 9th grade  Occupational History   Not on file  Tobacco Use   Smoking status: Every Day    Current packs/day: 0.50    Average packs/day: 0.5 packs/day for 50.0 years (25.0 ttl pk-yrs)    Types: Cigarettes   Smokeless tobacco: Never  Vaping Use   Vaping status: Some Days   Substances: Nicotine   Substance and Sexual Activity   Alcohol use: Yes    Comment: once a week   Drug use: Yes    Types: Marijuana    Comment: history of cocaine use, been about a year, per patient (12/18/21)   Sexual activity: Not Currently    Birth control/protection: Surgical  Other Topics Concern   Not on file  Social History Narrative   Not on file   Social Drivers of Health   Financial Resource Strain: Medium Risk (08/07/2023)   Overall Financial Resource Strain (CARDIA)    Difficulty of Paying Living Expenses: Somewhat hard  Food Insecurity: No Food Insecurity (01/12/2024)   Hunger Vital Sign    Worried About Running Out of Food in the Last Year: Never true    Ran Out of Food in the Last Year: Never true  Transportation  Needs: No Transportation Needs (01/12/2024)   PRAPARE - Administrator, Civil Service (Medical): No    Lack of Transportation (Non-Medical): No  Physical Activity: Inactive (08/07/2023)   Exercise Vital Sign    Days of Exercise per Week: 0 days    Minutes of Exercise per Session: 0 min  Stress: No Stress Concern Present (08/07/2023)   [employer] of Occupational Health - Occupational Stress Questionnaire    Feeling of Stress : Only a little  Social Connections: Moderately Integrated (01/09/2024)   Social Connection and Isolation Panel    Frequency of Communication with Friends and  Family: More than three times a week    Frequency of Social Gatherings with Friends and Family: More than three times a week    Attends Religious Services: 1 to 4 times per year    Active Member of [employer] or Organizations: No    Attends Engineer, structural: 1 to 4 times per year    Marital Status: Never married  Recent Concern: Social Connections - Moderately Isolated (12/04/2023)   Social Connection and Isolation Panel    Frequency of Communication with Friends and Family: Twice a week    Frequency of Social Gatherings with Friends and Family: Never    Attends Religious Services: 1 to 4 times per year    Active Member of [employer] or Organizations: No    Attends Engineer, structural: 1 to 4 times per year    Marital Status: Never married  Intimate Partner Violence: Not At Risk (01/12/2024)   Humiliation, Afraid, Rape, and Kick questionnaire    Fear of Current or Ex-Partner: No    Emotionally Abused: No    Physically Abused: No    Sexually Abused: No    FAMILY HISTORY: Family History  Problem Relation Age of Onset   Heart disease Mother    Hypertension Mother    Stroke Mother    Mental illness Mother    Heart disease Father    Hypertension Father    Diabetes Father    Colon polyps Brother    Heart disease Maternal Grandfather    Cancer Paternal Grandmother         mouth   Anesthesia problems Daughter    Breast cancer Maternal Aunt    Throat cancer Maternal Uncle    Thyroid  disease Paternal Aunt    Hypotension Neg Hx    Malignant hyperthermia Neg Hx    Pseudochol deficiency Neg Hx    Colon cancer Neg Hx    Stomach cancer Neg Hx    Esophageal cancer Neg Hx    Pancreatic cancer Neg Hx    Rectal cancer Neg Hx     ALLERGIES:  is allergic to avocado, latex, and codeine .  MEDICATIONS:  Current Outpatient Medications  Medication Sig Dispense Refill   albuterol  (VENTOLIN  HFA) 108 (90 Base) MCG/ACT inhaler INHALE 2 PUFFS into THE lungs EVERY 6 HOURS AS NEEDED wheezing AND SHORTNESS OF BREATH 54 g 1   alendronate  (FOSAMAX ) 70 MG tablet Take 1 tablet (70 mg total) by mouth every 7 (seven) days. Take with a full glass of water on an empty stomach. 12 tablet 0   aspirin  EC 81 MG tablet Take 81 mg by mouth in the morning.     atorvastatin  (LIPITOR ) 80 MG tablet Take 1 tablet (80 mg total) by mouth daily. 90 tablet 3   azithromycin  (ZITHROMAX ) 250 MG tablet Take 1 tablet (250 mg total) by mouth daily. 90 tablet 1   busPIRone  (BUSPAR ) 15 MG tablet Take 2 tablets (30 mg total) by mouth in the morning AND 1 tablet (15 mg total) daily at 12 noon AND 1 tablet (15 mg total) every evening. 360 tablet 0   clonazePAM  (KLONOPIN ) 0.5 MG tablet Take 1 tablet (0.5 mg total) by mouth 2 (two) times daily as needed. 60 tablet 0   doxepin  (SINEQUAN ) 75 MG capsule Take 1 capsule (75 mg total) by mouth at bedtime. 90 capsule 0   escitalopram  (LEXAPRO ) 20 MG tablet Take 1 tablet (20 mg total) by mouth daily. 90 tablet 0   ezetimibe  (ZETIA )  10 MG tablet Take 1 tablet (10 mg total) by mouth daily. 90 tablet 3   Fluticasone -Umeclidin-Vilant (TRELEGY ELLIPTA ) 200-62.5-25 MCG/ACT AEPB Inhale 1 puff into the lungs daily. 60 each 11   furosemide  (LASIX ) 40 MG tablet Take 1 tablet (40 mg total) by mouth daily. 90 tablet 1   gabapentin  (NEURONTIN ) 600 MG tablet Take 1 tablet (600 mg  total) by mouth 2 (two) times daily. 180 tablet 2   hydrOXYzine  (ATARAX ) 25 MG tablet Take 1 tablet (25 mg total) by mouth at bedtime as needed. 30 tablet 2   ipratropium-albuterol  (DUONEB) 0.5-2.5 (3) MG/3ML SOLN Take 3 mLs by nebulization every 8 (eight) hours. (Patient taking differently: Take 3 mLs by nebulization every 8 (eight) hours.) 90 mL 1   levothyroxine  (SYNTHROID ) 112 MCG tablet Take 1 tablet (112 mcg total) by mouth daily. 90 tablet 2   metFORMIN  (GLUCOPHAGE ) 500 MG tablet Take 1 tablet (500 mg total) by mouth 2 (two) times daily with a meal. 180 tablet 1   mirtazapine  (REMERON ) 45 MG tablet Take 1 tablet (45 mg total) by mouth at bedtime. 90 tablet 0   montelukast  (SINGULAIR ) 10 MG tablet Take 1 tablet (10 mg total) by mouth at bedtime. 90 tablet 2   nebivolol  (BYSTOLIC ) 10 MG tablet Take 1 tablet (10 mg total) by mouth daily. 90 tablet 3   nitroGLYCERIN  (NITROSTAT ) 0.4 MG SL tablet Place 1 tablet (0.4 mg total) under the tongue every 5 (five) minutes as needed for chest pain. 25 tablet 3   omeprazole  (PRILOSEC) 40 MG capsule Take 1 capsule (40 mg total) by mouth 2 (two) times daily before a meal. NEED APPOINTMENT FOR ADDITIONAL REFILLS 60 capsule 0   OXYGEN  Inhale 2-3 L/min into the lungs continuous.     ticagrelor  (BRILINTA ) 60 MG TABS tablet Take 1 tablet (60 mg total) by mouth 2 (two) times daily. 180 tablet 2   varenicline  (CHANTIX ) 1 MG tablet Take 1 tablet (1 mg total) by mouth 2 (two) times daily. 60 tablet 3   No current facility-administered medications for this visit.    REVIEW OF SYSTEMS:    10 Point review of Systems was done is negative except as noted above.   PHYSICAL EXAMINATION: ECOG PERFORMANCE STATUS: 2 - Symptomatic, <50% confined to bed  Vitals:   04/28/24 1437  BP: (!) 142/70  Pulse: 79  Resp: 18  Temp: 97.7 F (36.5 C)  SpO2: 97%    Filed Weights   04/28/24 1437  Weight: 166 lb (75.3 kg)   Body mass index is 31.37 kg/m.  Nasal cannula  connected to O2 tank in place GENERAL:alert, in no acute distress and comfortable SKIN: no acute rashes, no significant lesions EYES: conjunctiva are pink and non-injected, sclera anicteric OROPHARYNX: MMM, no exudates, no oropharyngeal erythema or ulceration NECK: supple, no JVD LYMPH:  no palpable lymphadenopathy in the cervical, axillary or inguinal regions LUNGS: clear to auscultation b/l with normal respiratory effort HEART: regular rate & rhythm ABDOMEN:  normoactive bowel sounds , non tender, not distended. Extremity: no pedal edema PSYCH: alert & oriented x 3 with fluent speech NEURO: no focal motor/sensory deficits   LABORATORY DATA:  I have reviewed the data as listed  .    Latest Ref Rng & Units 04/28/2024    1:44 PM 01/12/2024   11:25 AM 01/11/2024    9:29 AM  CBC EXTENDED  WBC 4.0 - 10.5 K/uL 12.2  22.6  15.1   RBC 3.87 - 5.11 MIL/uL  4.12  4.18  3.60   Hemoglobin 12.0 - 15.0 g/dL 88.2  87.1  89.0   HCT 36.0 - 46.0 % 36.8  39.7  34.5   Platelets 150 - 400 K/uL 466  561  448   NEUT# 1.7 - 7.7 K/uL 8.8     Lymph# 0.7 - 4.0 K/uL 2.3      Iron  Panel Lab Results  Component Value Date   IRON  76 04/28/2024   UIBC 401 04/28/2024   TIBC 477 (H) 04/28/2024   IRONPCTSAT 16 04/28/2024   FERRITIN 16 04/28/2024      Latest Ref Rng & Units 04/28/2024    1:44 PM 01/12/2024   11:25 AM 01/11/2024    9:29 AM  CMP  Glucose 70 - 99 mg/dL 851  851  876   BUN 8 - 23 mg/dL 12  18  18    Creatinine 0.44 - 1.00 mg/dL 9.18  9.16  9.25   Sodium 135 - 145 mmol/L 140  137  138   Potassium 3.5 - 5.1 mmol/L 3.6  4.0  3.6   Chloride 98 - 111 mmol/L 98  91  95   CO2 22 - 32 mmol/L 37  31  31   Calcium  8.9 - 10.3 mg/dL 9.4  89.5  9.4   Total Protein 6.5 - 8.1 g/dL 6.1     Total Bilirubin 0.0 - 1.2 mg/dL 0.2     Alkaline Phos 38 - 126 U/L 75     AST 15 - 41 U/L 13     ALT 0 - 44 U/L 13      JAK2 genetic sequencing done 10/03/2021 revealed 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  RADIOGRAPHIC STUDIES: I have personally reviewed the radiological images as listed and agreed with the findings in the report. MM 3D SCREENING MAMMOGRAM BILATERAL BREAST Result Date: 04/05/2024 CLINICAL DATA:  Screening. EXAM: DIGITAL SCREENING BILATERAL MAMMOGRAM WITH TOMOSYNTHESIS AND CAD TECHNIQUE: Bilateral screening digital craniocaudal and mediolateral oblique mammograms were obtained. Bilateral screening digital breast tomosynthesis was performed. The images were evaluated with computer-aided detection. COMPARISON:  Previous exam(s). ACR Breast Density Category a: The breasts are almost entirely fatty. FINDINGS: There are no findings suspicious for malignancy. IMPRESSION: No mammographic evidence of malignancy. A result letter of this screening mammogram will be mailed directly to the patient. RECOMMENDATION: Screening mammogram in one year. (Code:SM-B-01Y) BI-RADS CATEGORY  1: Negative. Electronically Signed   By: Dina  Arceo M.D.   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 Reactive from iron  deficiency. 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 - Independently reviewed Iron  Panel and Ferritin: Iron  76, Iron % 16, and Ferritin 16. No indication for IV Iron  at this time.  - No evidence of progression of the patient's CHIP at this time. - Discussed Tet2 mutation and CHIP  - I addressed the importance of smoking  cessation with the patient in detail.  We discussed the health benefits of cessation.  We discussed the health  detriments of ongoing tobacco use (I.e COPD, heart disease, and/or malignancy). We reviewed the multiple options for cessation and I offered to refer female to smoking cessation classes. We discussed other alternatives to quit such as Chantix  or Wellbutrin.    FOLLOW-UP: Return to clinic with Dr. Onesimo with labs in 12 months  The total time spent in the appointment was 20 minutes* .  All of the patient's questions were answered with apparent satisfaction. The patient knows to call the clinic with any problems, questions or concerns.   Emaline Onesimo MD MS AAHIVMS [facility] Hematology/Oncology Physician [facility]  .*Total Encounter Time as defined by the Centers for Medicare and Medicaid Services includes, in addition to the face-to-face time of a patient visit (documented in the note above) non-face-to-face time: obtaining and reviewing outside history, ordering and reviewing medications, tests or procedures, care coordination (communications with other health care professionals or caregivers) and documentation in the medical record.   I,  Damien Lagle,acting as a scribe for Emaline Onesimo, MD.,have documented all relevant documentation on the behalf of Emaline Onesimo, MD,as directed by  Emaline Onesimo, MD while in the presence of Emaline Onesimo, MD.  I have reviewed the above documentation for accuracy and completeness, and I agree with the above. Emaline Candida Onesimo MD.

## 2024-04-29 ENCOUNTER — Other Ambulatory Visit (HOSPITAL_COMMUNITY): Payer: Self-pay | Admitting: Emergency Medicine

## 2024-04-29 NOTE — Progress Notes (Signed)
 Paramedicine Encounter    Patient ID: [name] [name], female    DOB: 10-24-1955, 68 y.o.   MRN: [id number]   Complaints Low back pain post fall  Assessment  A&O x 4, skin W&D w/ good color.  Denies chest pain or SOB.  Lung sounds clear bilat.  Pt w/ low back pain post fall last week.    Compliance with meds YES  Pill box filled filled by daughter  Refills needed NONE  Meds changes since last visit NONE    Social changes NONE   BP 128/68 (BP Location: Left Arm, Patient Position: Sitting, Cuff Size: Normal)   Pulse 70   Resp 16   Wt 165 lb (74.8 kg)   SpO2 96%   BMI 31.18 kg/m  Weight yesterday-not taken Last visit weight-160lb  ACTION: Home visit completed  [name] [phone] 04/29/24  Patient Care Team: Vicci Barnie [name], MD as PCP - General (Internal Medicine) Burnard Debby LABOR, MD (Inactive) as PCP - Cardiology (Cardiology) Shaaron Lamar HERO, MD as Consulting Physician (Gastroenterology) Onesimo Emaline Brink, MD as Consulting Physician (Hematology) Collene Reginia BRAVO, [place] as Physician Assistant (Physician Assistant) Carolee Heron [name], RN as Case Manager  Patient Active Problem List   Diagnosis Date Noted  . Hypokalemia 01/08/2024  . Acute on chronic heart failure with preserved ejection fraction (HFpEF, >= 50%) (HCC) 12/05/2023  . At risk for polypharmacy 12/04/2023  . Long-term current use of antidepressant 11/17/2023  . Long-term current use of benzodiazepine 11/17/2023  . Sigmoid diverticulitis 11/12/2023  . Acute kidney injury superimposed on chronic kidney disease 11/12/2023  . CKD (chronic kidney disease) stage 2, GFR 60-89 ml/min 11/12/2023  . Sepsis secondary to sigmoid diverticulitis (HCC) 11/12/2023  . Acute on chronic hypoxic respiratory failure (HCC) 10/05/2023  . Pneumothorax, right 09/16/2023  . COPD with acute exacerbation (HCC) 09/01/2023  . Chronic hypoxic respiratory failure (HCC) 09/01/2023  . Continuous dependence on  cigarette smoking 09/01/2023  . History of CAD (coronary artery disease) 09/01/2023  . CAP (community acquired pneumonia) 09/01/2023  . Full code status 06/14/2023  . Acute hypoxic respiratory failure (HCC) 06/13/2023  . Acute exacerbation of chronic obstructive pulmonary disease (COPD) (HCC) 06/12/2023  . Nicotine  dependence 05/03/2023  . Substance abuse (HCC) 09/10/2022  . Iron  deficiency anemia 11/02/2021  . Major depressive disorder, recurrent, in full remission with anxious distress 05/31/2021  . Chronic neck pain 02/26/2021  . GAD (generalized anxiety disorder) 01/08/2021  . Esophageal dysphagia 04/28/2019  . Bipolar disorder (HCC) 09/17/2018  . Insomnia 10/28/2017  . Prediabetes 05/28/2017  . QT prolongation 03/25/2017  . Psoriasis 06/22/2015  . COPD (chronic obstructive pulmonary disease) (HCC)GOLD E 05/18/2015  . Anxiety and depression 07/10/2013  . Essential hypertension 06/07/2013  . Hyperlipidemia 06/07/2013  . Asthma, chronic 06/07/2013  . Emphysema lung (HCC) 06/07/2013  . Chronic back pain 06/07/2013  . CAD S/P percutaneous coronary angioplasty 06/07/2013  . GERD (gastroesophageal reflux disease) 06/07/2013  . Breast lump on left side at 3 o'clock position 10/13/2012  . S/P abdominal hysterectomy and right salpingo-oophorectomy 08/29/2011  . COPD exacerbation (HCC) 09/15/2009  . TOBACCO ABUSE 07/17/2009  . Chronic rhinitis 07/17/2009  . Lung nodule < 6cm on CT 07/17/2009  . Hypothyroidism 12/22/2008  . Chronic depression 12/22/2008    Current Outpatient Medications:  .  albuterol  (VENTOLIN  HFA) 108 (90 Base) MCG/ACT inhaler, INHALE 2 PUFFS into THE lungs EVERY 6 HOURS AS NEEDED wheezing AND SHORTNESS OF BREATH, Disp: 54 g, Rfl: 1 .  alendronate  (FOSAMAX ) 70 MG tablet, Take 1 tablet (70 mg total) by mouth every 7 (seven) days. Take with a full glass of water on an empty stomach., Disp: 12 tablet, Rfl: 0 .  aspirin  EC 81 MG tablet, Take 81 mg by mouth in the  morning., Disp: , Rfl:  .  atorvastatin  (LIPITOR ) 80 MG tablet, Take 1 tablet (80 mg total) by mouth daily., Disp: 90 tablet, Rfl: 3 .  azithromycin  (ZITHROMAX ) 250 MG tablet, Take 1 tablet (250 mg total) by mouth daily., Disp: 90 tablet, Rfl: 1 .  busPIRone  (BUSPAR ) 15 MG tablet, Take 2 tablets (30 mg total) by mouth in the morning AND 1 tablet (15 mg total) daily at 12 noon AND 1 tablet (15 mg total) every evening., Disp: 360 tablet, Rfl: 0 .  clonazePAM  (KLONOPIN ) 0.5 MG tablet, Take 1 tablet (0.5 mg total) by mouth 2 (two) times daily as needed., Disp: 60 tablet, Rfl: 0 .  doxepin  (SINEQUAN ) 75 MG capsule, Take 1 capsule (75 mg total) by mouth at bedtime., Disp: 90 capsule, Rfl: 0 .  escitalopram  (LEXAPRO ) 20 MG tablet, Take 1 tablet (20 mg total) by mouth daily., Disp: 90 tablet, Rfl: 0 .  ezetimibe  (ZETIA ) 10 MG tablet, Take 1 tablet (10 mg total) by mouth daily., Disp: 90 tablet, Rfl: 3 .  Fluticasone -Umeclidin-Vilant (TRELEGY ELLIPTA ) 200-62.5-25 MCG/ACT AEPB, Inhale 1 puff into the lungs daily., Disp: 60 each, Rfl: 11 .  furosemide  (LASIX ) 40 MG tablet, Take 1 tablet (40 mg total) by mouth daily., Disp: 90 tablet, Rfl: 1 .  gabapentin  (NEURONTIN ) 600 MG tablet, Take 1 tablet (600 mg total) by mouth 2 (two) times daily., Disp: 180 tablet, Rfl: 2 .  hydrOXYzine  (ATARAX ) 25 MG tablet, Take 1 tablet (25 mg total) by mouth at bedtime as needed., Disp: 30 tablet, Rfl: 2 .  ipratropium-albuterol  (DUONEB) 0.5-2.5 (3) MG/3ML SOLN, Take 3 mLs by nebulization every 8 (eight) hours. (Patient taking differently: Take 3 mLs by nebulization every 8 (eight) hours.), Disp: 90 mL, Rfl: 1 .  levothyroxine  (SYNTHROID ) 112 MCG tablet, Take 1 tablet (112 mcg total) by mouth daily., Disp: 90 tablet, Rfl: 2 .  metFORMIN  (GLUCOPHAGE ) 500 MG tablet, Take 1 tablet (500 mg total) by mouth 2 (two) times daily with a meal., Disp: 180 tablet, Rfl: 1 .  mirtazapine  (REMERON ) 45 MG tablet, Take 1 tablet (45 mg total) by  mouth at bedtime., Disp: 90 tablet, Rfl: 0 .  montelukast  (SINGULAIR ) 10 MG tablet, Take 1 tablet (10 mg total) by mouth at bedtime., Disp: 90 tablet, Rfl: 2 .  nebivolol  (BYSTOLIC ) 10 MG tablet, Take 1 tablet (10 mg total) by mouth daily., Disp: 90 tablet, Rfl: 3 .  nitroGLYCERIN  (NITROSTAT ) 0.4 MG SL tablet, Place 1 tablet (0.4 mg total) under the tongue every 5 (five) minutes as needed for chest pain., Disp: 25 tablet, Rfl: 3 .  omeprazole  (PRILOSEC) 40 MG capsule, Take 1 capsule (40 mg total) by mouth 2 (two) times daily before a meal. NEED APPOINTMENT FOR ADDITIONAL REFILLS, Disp: 60 capsule, Rfl: 0 .  OXYGEN , Inhale 2-3 L/min into the lungs continuous., Disp: , Rfl:  .  ticagrelor  (BRILINTA ) 60 MG TABS tablet, Take 1 tablet (60 mg total) by mouth 2 (two) times daily., Disp: 180 tablet, Rfl: 2 .  varenicline  (CHANTIX ) 1 MG tablet, Take 1 tablet (1 mg total) by mouth 2 (two) times daily., Disp: 60 tablet, Rfl: 3 Allergies  Allergen Reactions  . Avocado Anaphylaxis  . Latex  Shortness Of Breath and Rash  . Codeine  Nausea Only     Social History   Socioeconomic History  . Marital status: Divorced    Spouse name: Not on file  . Number of children: 1  . Years of education: Not on file  . Highest education level: 9th grade  Occupational History  . Not on file  Tobacco Use  . Smoking status: Every Day    Current packs/day: 0.50    Average packs/day: 0.5 packs/day for 50.0 years (25.0 ttl pk-yrs)    Types: Cigarettes  . Smokeless tobacco: Never  Vaping Use  . Vaping status: Some Days  . Substances: Nicotine   Substance and Sexual Activity  . Alcohol use: Yes    Comment: once a week  . Drug use: Yes    Types: Marijuana    Comment: history of cocaine use, been about a year, per patient (12/18/21)  . Sexual activity: Not Currently    Birth control/protection: Surgical  Other Topics Concern  . Not on file  Social History Narrative  . Not on file   Social Drivers of Health    Financial Resource Strain: Medium Risk (08/07/2023)   Overall Financial Resource Strain (CARDIA)   . Difficulty of Paying Living Expenses: Somewhat hard  Food Insecurity: No Food Insecurity (01/12/2024)   Hunger Vital Sign   . Worried About Programme researcher, broadcasting/film/video in the Last Year: Never true   . Ran Out of Food in the Last Year: Never true  Transportation Needs: No Transportation Needs (01/12/2024)   PRAPARE - Transportation   . Lack of Transportation (Medical): No   . Lack of Transportation (Non-Medical): No  Physical Activity: Inactive (08/07/2023)   Exercise Vital Sign   . Days of Exercise per Week: 0 days   . Minutes of Exercise per Session: 0 min  Stress: No Stress Concern Present (08/07/2023)   [employer] of Occupational Health - Occupational Stress Questionnaire   . Feeling of Stress : Only a little  Social Connections: Moderately Integrated (01/09/2024)   Social Connection and Isolation Panel   . Frequency of Communication with Friends and Family: More than three times a week   . Frequency of Social Gatherings with Friends and Family: More than three times a week   . Attends Religious Services: 1 to 4 times per year   . Active Member of Clubs or Organizations: No   . Attends Banker Meetings: 1 to 4 times per year   . Marital Status: Never married  Recent Concern: Social Connections - Moderately Isolated (12/04/2023)   Social Connection and Isolation Panel   . Frequency of Communication with Friends and Family: Twice a week   . Frequency of Social Gatherings with Friends and Family: Never   . Attends Religious Services: 1 to 4 times per year   . Active Member of Clubs or Organizations: No   . Attends Banker Meetings: 1 to 4 times per year   . Marital Status: Never married  Intimate Partner Violence: Not At Risk (01/12/2024)   Humiliation, Afraid, Rape, and Kick questionnaire   . Fear of Current or Ex-Partner: No   . Emotionally Abused: No   .  Physically Abused: No   . Sexually Abused: No    Physical Exam      Future Appointments  Date Time Provider Department Center  05/18/2024  4:00 PM Collene Reginia BRAVO, [place] [facility] None  05/24/2024  4:00 PM Kara Dorn NOVAK, MD [facility] [phone]  LELON Das  06/21/2024  4:10 PM Vicci Barnie NOVAK, MD [facility] [address]  06/22/2024  4:10 PM [facility] VISIT [facility] [address]  07/13/2024  4:00 PM Floretta Mallard, MD [facility]  05/06/2025  1:30 PM [facility] None  05/06/2025  2:00 PM Onesimo Emaline Brink, MD [facility] None

## 2024-05-02 ENCOUNTER — Other Ambulatory Visit (HOSPITAL_COMMUNITY): Payer: Self-pay

## 2024-05-03 ENCOUNTER — Other Ambulatory Visit (HOSPITAL_COMMUNITY): Payer: Self-pay | Admitting: Physician Assistant

## 2024-05-03 ENCOUNTER — Other Ambulatory Visit: Payer: Self-pay

## 2024-05-03 DIAGNOSIS — F411 Generalized anxiety disorder: Secondary | ICD-10-CM

## 2024-05-04 ENCOUNTER — Telehealth (HOSPITAL_COMMUNITY): Payer: Self-pay

## 2024-05-04 ENCOUNTER — Other Ambulatory Visit (HOSPITAL_COMMUNITY): Payer: Self-pay | Admitting: Psychiatry

## 2024-05-04 ENCOUNTER — Other Ambulatory Visit (HOSPITAL_COMMUNITY): Payer: Self-pay

## 2024-05-04 ENCOUNTER — Other Ambulatory Visit: Payer: Self-pay

## 2024-05-04 DIAGNOSIS — F411 Generalized anxiety disorder: Secondary | ICD-10-CM

## 2024-05-04 MED ORDER — CLONAZEPAM 0.5 MG PO TABS
0.5000 mg | ORAL_TABLET | Freq: Two times a day (BID) | ORAL | 0 refills | Status: DC | PRN
  Filled 2024-05-04: qty 60, 30d supply, fill #0

## 2024-05-04 NOTE — Telephone Encounter (Signed)
 Medication sent to preferred pharmacy

## 2024-05-04 NOTE — Telephone Encounter (Signed)
 Patient's daughter called in for refill of Klonopin  0.5 mg. Patient canceled last two appointments. Patient has appointment with provider 05/18/24. Can a bridge of medications be done until next appointment?

## 2024-05-05 ENCOUNTER — Encounter: Payer: Self-pay | Admitting: Hematology

## 2024-05-05 ENCOUNTER — Other Ambulatory Visit: Payer: Self-pay | Admitting: Pharmacist

## 2024-05-05 NOTE — Progress Notes (Signed)
 Pharmacy Quality Measure Review  This patient is appearing on a report  the adherence measure for hypertension (ACEi/ARB) medications this calendar year.   Medication: losartan  Last fill date: 11/13/23 for 90 day supply  This is an error. Per chart, losartan  was discontinued earlier this year due to hypotension.   Herlene Fleeta Morris, PharmD, JAQUELINE, CPP Clinical Pharmacist [facility] [phone]

## 2024-05-06 ENCOUNTER — Other Ambulatory Visit (HOSPITAL_COMMUNITY): Payer: Self-pay

## 2024-05-07 ENCOUNTER — Other Ambulatory Visit (HOSPITAL_COMMUNITY): Payer: Self-pay

## 2024-05-13 ENCOUNTER — Other Ambulatory Visit: Payer: Self-pay | Admitting: Internal Medicine

## 2024-05-13 ENCOUNTER — Other Ambulatory Visit (HOSPITAL_COMMUNITY): Payer: Self-pay | Admitting: Emergency Medicine

## 2024-05-13 NOTE — Progress Notes (Unsigned)
 Paramedicine Encounter    Patient ID: [name] [name], female    DOB: 1956/01/30, 68 y.o.   MRN: [id number]   Complaints NONE  Assessment A&O x 4, skin W&D w/ good color.  Denies chest pain.  Normal SOB.    Compliance with meds YES  Pill box filled filled by daughter  Refills needed N/A  Meds changes since last visit NONE    Social changes NONE   BP 108/68 (BP Location: Left Arm, Patient Position: Sitting, Cuff Size: Normal)   Pulse 70   Resp 16   Wt 164 lb (74.4 kg)   SpO2 96%   BMI 30.99 kg/m  Weight yesterday-  Last visit weight-165lb  ACTION: Home visit completed  [name] [phone] 05/13/24  Patient Care Team: Vicci Barnie [name], MD as PCP - General (Internal Medicine) Burnard Debby LABOR, MD (Inactive) as PCP - Cardiology (Cardiology) Shaaron Lamar HERO, MD as Consulting Physician (Gastroenterology) Onesimo Emaline Brink, MD as Consulting Physician (Hematology) Collene Reginia BRAVO, [place] as Physician Assistant (Physician Assistant) Carolee Heron [name], RN as Case Manager  Patient Active Problem List   Diagnosis Date Noted   Hypokalemia 01/08/2024   Acute on chronic heart failure with preserved ejection fraction (HFpEF, >= 50%) (HCC) 12/05/2023   At risk for polypharmacy 12/04/2023   Long-term current use of antidepressant 11/17/2023   Long-term current use of benzodiazepine 11/17/2023   Sigmoid diverticulitis 11/12/2023   Acute kidney injury superimposed on chronic kidney disease 11/12/2023   CKD (chronic kidney disease) stage 2, GFR 60-89 ml/min 11/12/2023   Sepsis secondary to sigmoid diverticulitis (HCC) 11/12/2023   Acute on chronic hypoxic respiratory failure (HCC) 10/05/2023   Pneumothorax, right 09/16/2023   COPD with acute exacerbation (HCC) 09/01/2023   Chronic hypoxic respiratory failure (HCC) 09/01/2023   Continuous dependence on cigarette smoking 09/01/2023   History of CAD (coronary artery disease) 09/01/2023   CAP (community acquired  pneumonia) 09/01/2023   Full code status 06/14/2023   Acute hypoxic respiratory failure (HCC) 06/13/2023   Acute exacerbation of chronic obstructive pulmonary disease (COPD) (HCC) 06/12/2023   Nicotine  dependence 05/03/2023   Substance abuse (HCC) 09/10/2022   Iron  deficiency anemia 11/02/2021   Major depressive disorder, recurrent, in full remission with anxious distress 05/31/2021   Chronic neck pain 02/26/2021   GAD (generalized anxiety disorder) 01/08/2021   Esophageal dysphagia 04/28/2019   Bipolar disorder (HCC) 09/17/2018   Insomnia 10/28/2017   Prediabetes 05/28/2017   QT prolongation 03/25/2017   Psoriasis 06/22/2015   COPD (chronic obstructive pulmonary disease) (HCC)GOLD E 05/18/2015   Anxiety and depression 07/10/2013   Essential hypertension 06/07/2013   Hyperlipidemia 06/07/2013   Asthma, chronic 06/07/2013   Emphysema lung (HCC) 06/07/2013   Chronic back pain 06/07/2013   CAD S/P percutaneous coronary angioplasty 06/07/2013   GERD (gastroesophageal reflux disease) 06/07/2013   Breast lump on left side at 3 o'clock position 10/13/2012   S/P abdominal hysterectomy and right salpingo-oophorectomy 08/29/2011   COPD exacerbation (HCC) 09/15/2009   TOBACCO ABUSE 07/17/2009   Chronic rhinitis 07/17/2009   Lung nodule < 6cm on CT 07/17/2009   Hypothyroidism 12/22/2008   Chronic depression 12/22/2008    Current Outpatient Medications:    albuterol  (VENTOLIN  HFA) 108 (90 Base) MCG/ACT inhaler, INHALE 2 PUFFS into THE lungs EVERY 6 HOURS AS NEEDED wheezing AND SHORTNESS OF BREATH, Disp: 54 g, Rfl: 1   alendronate  (FOSAMAX ) 70 MG tablet, Take 1 tablet (70 mg total) by mouth every 7 (seven) days. Take with  a full glass of water on an empty stomach., Disp: 12 tablet, Rfl: 0   aspirin  EC 81 MG tablet, Take 81 mg by mouth in the morning., Disp: , Rfl:    atorvastatin  (LIPITOR ) 80 MG tablet, Take 1 tablet (80 mg total) by mouth daily., Disp: 90 tablet, Rfl: 3   azithromycin   (ZITHROMAX ) 250 MG tablet, Take 1 tablet (250 mg total) by mouth daily., Disp: 90 tablet, Rfl: 1   busPIRone  (BUSPAR ) 15 MG tablet, Take 2 tablets (30 mg total) by mouth in the morning AND 1 tablet (15 mg total) daily at 12 noon AND 1 tablet (15 mg total) every evening., Disp: 360 tablet, Rfl: 0   clonazePAM  (KLONOPIN ) 0.5 MG tablet, Take 1 tablet (0.5 mg total) by mouth 2 (two) times daily as needed., Disp: 60 tablet, Rfl: 0   doxepin  (SINEQUAN ) 75 MG capsule, Take 1 capsule (75 mg total) by mouth at bedtime., Disp: 90 capsule, Rfl: 0   escitalopram  (LEXAPRO ) 20 MG tablet, Take 1 tablet (20 mg total) by mouth daily., Disp: 90 tablet, Rfl: 0   ezetimibe  (ZETIA ) 10 MG tablet, Take 1 tablet (10 mg total) by mouth daily., Disp: 90 tablet, Rfl: 3   Fluticasone -Umeclidin-Vilant (TRELEGY ELLIPTA ) 200-62.5-25 MCG/ACT AEPB, Inhale 1 puff into the lungs daily., Disp: 60 each, Rfl: 11   furosemide  (LASIX ) 40 MG tablet, Take 1 tablet (40 mg total) by mouth daily., Disp: 90 tablet, Rfl: 1   gabapentin  (NEURONTIN ) 600 MG tablet, Take 1 tablet (600 mg total) by mouth 2 (two) times daily., Disp: 180 tablet, Rfl: 2   hydrOXYzine  (ATARAX ) 25 MG tablet, Take 1 tablet (25 mg total) by mouth at bedtime as needed., Disp: 30 tablet, Rfl: 2   ipratropium-albuterol  (DUONEB) 0.5-2.5 (3) MG/3ML SOLN, Take 3 mLs by nebulization every 8 (eight) hours. (Patient taking differently: Take 3 mLs by nebulization every 8 (eight) hours.), Disp: 90 mL, Rfl: 1   levothyroxine  (SYNTHROID ) 112 MCG tablet, Take 1 tablet (112 mcg total) by mouth daily., Disp: 90 tablet, Rfl: 2   metFORMIN  (GLUCOPHAGE ) 500 MG tablet, Take 1 tablet (500 mg total) by mouth 2 (two) times daily with a meal., Disp: 180 tablet, Rfl: 1   mirtazapine  (REMERON ) 45 MG tablet, Take 1 tablet (45 mg total) by mouth at bedtime., Disp: 90 tablet, Rfl: 0   montelukast  (SINGULAIR ) 10 MG tablet, Take 1 tablet (10 mg total) by mouth at bedtime., Disp: 90 tablet, Rfl: 2    nebivolol  (BYSTOLIC ) 10 MG tablet, Take 1 tablet (10 mg total) by mouth daily., Disp: 90 tablet, Rfl: 3   nitroGLYCERIN  (NITROSTAT ) 0.4 MG SL tablet, Place 1 tablet (0.4 mg total) under the tongue every 5 (five) minutes as needed for chest pain., Disp: 25 tablet, Rfl: 3   omeprazole  (PRILOSEC) 40 MG capsule, Take 1 capsule (40 mg total) by mouth 2 (two) times daily before a meal. NEED APPOINTMENT FOR ADDITIONAL REFILLS, Disp: 60 capsule, Rfl: 0   OXYGEN , Inhale 2-3 L/min into the lungs continuous., Disp: , Rfl:    ticagrelor  (BRILINTA ) 60 MG TABS tablet, Take 1 tablet (60 mg total) by mouth 2 (two) times daily., Disp: 180 tablet, Rfl: 2   varenicline  (CHANTIX ) 1 MG tablet, Take 1 tablet (1 mg total) by mouth 2 (two) times daily., Disp: 60 tablet, Rfl: 3 Allergies  Allergen Reactions   Avocado Anaphylaxis   Latex Shortness Of Breath and Rash   Codeine  Nausea Only     Social History   Socioeconomic  History   Marital status: Divorced    Spouse name: Not on file   Number of children: 1   Years of education: Not on file   Highest education level: 9th grade  Occupational History   Not on file  Tobacco Use   Smoking status: Every Day    Current packs/day: 0.50    Average packs/day: 0.5 packs/day for 50.0 years (25.0 ttl pk-yrs)    Types: Cigarettes   Smokeless tobacco: Never  Vaping Use   Vaping status: Some Days   Substances: Nicotine   Substance and Sexual Activity   Alcohol use: Yes    Comment: once a week   Drug use: Yes    Types: Marijuana    Comment: history of cocaine use, been about a year, per patient (12/18/21)   Sexual activity: Not Currently    Birth control/protection: Surgical  Other Topics Concern   Not on file  Social History Narrative   Not on file   Social Drivers of Health   Financial Resource Strain: Medium Risk (08/07/2023)   Overall Financial Resource Strain (CARDIA)    Difficulty of Paying Living Expenses: Somewhat hard  Food Insecurity: No Food  Insecurity (01/12/2024)   Hunger Vital Sign    Worried About Running Out of Food in the Last Year: Never true    Ran Out of Food in the Last Year: Never true  Transportation Needs: No Transportation Needs (01/12/2024)   PRAPARE - Administrator, Civil Service (Medical): No    Lack of Transportation (Non-Medical): No  Physical Activity: Inactive (08/07/2023)   Exercise Vital Sign    Days of Exercise per Week: 0 days    Minutes of Exercise per Session: 0 min  Stress: No Stress Concern Present (08/07/2023)   [employer] of Occupational Health - Occupational Stress Questionnaire    Feeling of Stress : Only a little  Social Connections: Moderately Integrated (01/09/2024)   Social Connection and Isolation Panel    Frequency of Communication with Friends and Family: More than three times a week    Frequency of Social Gatherings with Friends and Family: More than three times a week    Attends Religious Services: 1 to 4 times per year    Active Member of Clubs or Organizations: No    Attends Banker Meetings: 1 to 4 times per year    Marital Status: Never married  Recent Concern: Social Connections - Moderately Isolated (12/04/2023)   Social Connection and Isolation Panel    Frequency of Communication with Friends and Family: Twice a week    Frequency of Social Gatherings with Friends and Family: Never    Attends Religious Services: 1 to 4 times per year    Active Member of Clubs or Organizations: No    Attends Banker Meetings: 1 to 4 times per year    Marital Status: Never married  Intimate Partner Violence: Not At Risk (01/12/2024)   Humiliation, Afraid, Rape, and Kick questionnaire    Fear of Current or Ex-Partner: No    Emotionally Abused: No    Physically Abused: No    Sexually Abused: No    Physical Exam      Future Appointments  Date Time Provider Department Center  05/18/2024  4:00 PM Collene Reginia BRAVO, [place] [facility] None  05/24/2024   4:00 PM Kara Dorn NOVAK, MD [facility] [address]  06/21/2024  4:10 PM Vicci Barnie NOVAK, MD [facility] [address]  06/22/2024  4:10 PM  [facility] ANNUAL WELLNESS VISIT [facility] [address]  07/13/2024  4:00 PM Floretta Mallard, MD [facility]V  05/06/2025  1:30 PM [facility] None  05/06/2025  2:00 PM Onesimo Emaline Brink, MD [facility] None

## 2024-05-14 ENCOUNTER — Other Ambulatory Visit (HOSPITAL_COMMUNITY): Payer: Self-pay

## 2024-05-17 ENCOUNTER — Other Ambulatory Visit: Payer: Self-pay | Admitting: Gastroenterology

## 2024-05-17 DIAGNOSIS — K219 Gastro-esophageal reflux disease without esophagitis: Secondary | ICD-10-CM

## 2024-05-17 NOTE — Telephone Encounter (Signed)
 Received fax from [facility] - unable to reach patient for adherence education. Fill history demonstrates recent fill. Will message patient to ensure there are no issues.

## 2024-05-18 ENCOUNTER — Telehealth (HOSPITAL_COMMUNITY): Admitting: Physician Assistant

## 2024-05-18 ENCOUNTER — Encounter (HOSPITAL_COMMUNITY): Payer: Self-pay | Admitting: Physician Assistant

## 2024-05-18 DIAGNOSIS — F3342 Major depressive disorder, recurrent, in full remission: Secondary | ICD-10-CM

## 2024-05-18 DIAGNOSIS — F411 Generalized anxiety disorder: Secondary | ICD-10-CM | POA: Diagnosis not present

## 2024-05-18 DIAGNOSIS — Z79899 Other long term (current) drug therapy: Secondary | ICD-10-CM | POA: Diagnosis not present

## 2024-05-18 DIAGNOSIS — G4700 Insomnia, unspecified: Secondary | ICD-10-CM

## 2024-05-18 MED ORDER — CLONAZEPAM 0.5 MG PO TABS
0.5000 mg | ORAL_TABLET | Freq: Two times a day (BID) | ORAL | 0 refills | Status: DC | PRN
  Filled 2024-05-18 – 2024-06-14 (×2): qty 60, 30d supply, fill #0

## 2024-05-18 MED ORDER — ESCITALOPRAM OXALATE 20 MG PO TABS
20.0000 mg | ORAL_TABLET | Freq: Every day | ORAL | 0 refills | Status: AC
  Filled 2024-05-18: qty 90, 90d supply, fill #0

## 2024-05-18 MED ORDER — MIRTAZAPINE 45 MG PO TABS
45.0000 mg | ORAL_TABLET | Freq: Every day | ORAL | 0 refills | Status: AC
  Filled 2024-05-18: qty 90, 90d supply, fill #0

## 2024-05-18 MED ORDER — HYDROXYZINE HCL 25 MG PO TABS
25.0000 mg | ORAL_TABLET | Freq: Every evening | ORAL | 2 refills | Status: AC | PRN
  Filled 2024-05-18: qty 30, 30d supply, fill #0

## 2024-05-18 MED ORDER — DOXEPIN HCL 75 MG PO CAPS
75.0000 mg | ORAL_CAPSULE | Freq: Every day | ORAL | 0 refills | Status: AC
  Filled 2024-05-18: qty 90, 90d supply, fill #0

## 2024-05-18 MED ORDER — BUSPIRONE HCL 15 MG PO TABS
ORAL_TABLET | ORAL | 0 refills | Status: AC
  Filled 2024-05-18: qty 360, 90d supply, fill #0

## 2024-05-18 NOTE — Progress Notes (Signed)
 BH MD/PA/NP OP Progress Note  Virtual Visit via Video Note  I connected with [name] [name] on 05/18/24 at  4:00 PM EST by a video enabled telemedicine application and verified that I am speaking with the correct person using two identifiers.  Location: Patient: Home Provider: Clinic   I discussed the limitations of evaluation and management by telemedicine and the availability of in person appointments. The patient expressed understanding and agreed to proceed.  Follow Up Instructions:   I discussed the assessment and treatment plan with the patient. The patient was provided an opportunity to ask questions and all were answered. The patient agreed with the plan and demonstrated an understanding of the instructions.   The patient was advised to call back or seek an in-person evaluation if the symptoms worsen or if the condition fails to improve as anticipated.  I provided 17 minutes of non-face-to-face time during this encounter.  [name] FORBES Bolster, PA   05/18/2024 4:00 PM [name] [name]  MRN:  [id number]  Chief Complaint:  Chief Complaint  Patient presents with   Follow-up   Medication Refill   HPI:   [name] [name] is a 68 year old female with a past psychiatric history significant for insomnia, generalized anxiety disorder, and major depressive disorder who presents to [facility] via virtual video visit for follow-up and medication management.  Patient is currently being managed on the following medications:  Doxepin  (Sinequan ) 75 mg at bedtime Buspirone  15 mg 2 times daily in the morning/1 tablet at noon/1 tablet in the evening  Escitalopram  20 mg daily Clonazepam  0.5 mg 2 times daily as needed Mirtazapine  45 mg at bedtime Hydroxyzine  25 mg at bedtime as needed  Patient reports that she has COPD and is having a difficult time breathing even when on her oxygen .  Despite her COPD diagnosis, patient reports that she is taking her  medications regularly and denies experiencing any adverse side effects.  Patient denies overt depressive symptoms nor does she endorse anxiety.  She reports that she continues to take her clonazepam  regularly and denies experiencing any adverse side effects from the medication.  She reports that the medication kicks in 15 minutes after use and she is able to stay calm and collected throughout the day.  A PHQ-9 screen was performed with the patient scoring a 9.  A GAD-7 screen was also performed with the patient scoring a 9.  Patient is alert and oriented x 4, pleasant, calm, cooperative, and fully engaged in conversation during the encounter.  Patient endorses good mood.  Patient exhibits euthymic mood with appropriate affect.  Patient denies suicidal or homicidal ideations.  She further denies auditory or visual hallucinations and does not appear to be responding to internal/external stimuli.  Patient endorses good sleep and receives on average 10 hours of sleep per night.  Patient endorses fair appetite and eats on average 1 meal per day.  Patient denies alcohol consumption or illicit drug use.  Patient endorses tobacco use and smokes on average 8 cigarettes/day.  Visit Diagnosis:    ICD-10-CM   1. Long-term current use of benzodiazepine  Z79.899     2. GAD (generalized anxiety disorder)  F41.1 clonazePAM  (KLONOPIN ) 0.5 MG tablet    escitalopram  (LEXAPRO ) 20 MG tablet    busPIRone  (BUSPAR ) 15 MG tablet    mirtazapine  (REMERON ) 45 MG tablet    3. Major depressive disorder, recurrent, in full remission with anxious distress  F33.42 escitalopram  (LEXAPRO ) 20 MG tablet  busPIRone  (BUSPAR ) 15 MG tablet    mirtazapine  (REMERON ) 45 MG tablet    doxepin  (SINEQUAN ) 75 MG capsule    4. Insomnia, unspecified type  G47.00 hydrOXYzine  (ATARAX ) 25 MG tablet       Past Psychiatric History:  Insomnia Major depressive disorder Generalized anxiety disorder  Past Medical History:  Past Medical History:   Diagnosis Date   Allergy    seasonal   Anemia    Anxiety    takes Lexapro  daily   Arthritis    back, from neck down pass my bra area (03/25/2017)   Asthma    Bartholin gland cyst 08/29/2011   Bruises easily    pt is on Effient    Chronic back pain    herniated nucleus pulposus   Chronic back pain    neck to bra area; lower back (03/25/2017)   Chronic kidney disease    recurrent UTI's this year 2022   COPD (chronic obstructive pulmonary disease) (HCC)    early stages   Coronary artery disease    Depression    takes Klonopin  daily   Diabetes mellitus without complication (HCC)    Diverticulosis    Fibroadenoma of left breast    GERD (gastroesophageal reflux disease)    takes Nexium  daily   H/O hiatal hernia    Heart attack (HCC) 2011   Hemorrhoids    Hernia    Hyperlipidemia    takes Lipitor  daily   Hypertension    takes Losartan  daily and Labetalol  bid   Hypothyroidism    takes Synthroid  daily   Insomnia    hydroxyzine  prn   Joint pain    Pneumonia    couple times (03/25/2017)   Pre-diabetes    just found out 1 wk ago (03/25/2017)   Psoriasis    elbows,knees,back   Shortness of breath    with exertion   Slowing of urinary stream    Stress incontinence     Past Surgical History:  Procedure Laterality Date   ABDOMINAL EXPLORATION SURGERY  1977   ABDOMINAL HYSTERECTOMY  1977   left one of my ovaries   BACK SURGERY     BIOPSY  05/27/2019   Procedure: BIOPSY;  Surgeon: Shaaron Lamar HERO, MD;  [facility]: [facility];  Service: Endoscopy;;   BREAST BIOPSY Left 11/2021   x2   COLONOSCOPY  2011   Dr. Teressa: Mild diverticulosis, descending diminutive colon polyp (not retrieved), next colonoscopy 10 years   CORONARY ANGIOPLASTY WITH STENT PLACEMENT  2011 X2   regular stents didn't work; had to go back in in ~ 1 month and put in medicated stents   DILATION AND CURETTAGE OF UTERUS     ESOPHAGOGASTRODUODENOSCOPY     ESOPHAGOGASTRODUODENOSCOPY (EGD) WITH  PROPOFOL  N/A 05/27/2019   normal esophagus, dilation, erosive gastropathy s/p biopsy, normal duodenum. Negative H.pylori.    LEFT HEART CATH AND CORONARY ANGIOGRAPHY N/A 03/26/2017   Procedure: LEFT HEART CATH AND CORONARY ANGIOGRAPHY;  Surgeon: Claudene Victory ORN, MD;  Location: [facility] INVASIVE CV LAB;  Service: Cardiovascular;  Laterality: N/A;   LUMBAR LAMINECTOMY/DECOMPRESSION MICRODISCECTOMY  08/05/2011   Procedure: LUMBAR LAMINECTOMY/DECOMPRESSION MICRODISCECTOMY;  Surgeon: Lynwood JONELLE Mill, MD;  Location: [facility] NEURO ORS;  Service: Neurosurgery;  Laterality: Right;  Right Lumbar four-five extraforaminal discectomy   MALONEY DILATION N/A 05/27/2019   Procedure: AGAPITO DILATION;  Surgeon: Shaaron Lamar HERO, MD;  Location: [facility];  Service: Endoscopy;  Laterality: N/A;  54   SHOULDER ARTHROSCOPY WITH ROTATOR CUFF REPAIR AND  OPEN BICEPS TENODESIS Right 12/24/2021   Procedure: RIGHT SHOULDER ARTHROSCOPY WITH ROTATOR CUFF REPAIR AND BICEPS TENODESIS;  Surgeon: Barbarann Oneil BROCKS, MD;  Location: [facility] OR;  Service: Orthopedics;  Laterality: Right;   TONSILLECTOMY     as a child   UPPER GASTROINTESTINAL ENDOSCOPY      Family Psychiatric History:  See intake H&P for full details. Reviewed, with no updates at this time.  Family History:  Family History  Problem Relation Age of Onset   Heart disease Mother    Hypertension Mother    Stroke Mother    Mental illness Mother    Heart disease Father    Hypertension Father    Diabetes Father    Colon polyps Brother    Heart disease Maternal Grandfather    Cancer Paternal Grandmother        mouth   Anesthesia problems Daughter    Breast cancer Maternal Aunt    Throat cancer Maternal Uncle    Thyroid  disease Paternal Aunt    Hypotension Neg Hx    Malignant hyperthermia Neg Hx    Pseudochol deficiency Neg Hx    Colon cancer Neg Hx    Stomach cancer Neg Hx    Esophageal cancer Neg Hx    Pancreatic cancer Neg Hx    Rectal cancer Neg Hx     Social  History:  Social History   Socioeconomic History   Marital status: Divorced    Spouse name: Not on file   Number of children: 1   Years of education: Not on file   Highest education level: 9th grade  Occupational History   Not on file  Tobacco Use   Smoking status: Every Day    Current packs/day: 0.50    Average packs/day: 0.5 packs/day for 50.0 years (25.0 ttl pk-yrs)    Types: Cigarettes   Smokeless tobacco: Never  Vaping Use   Vaping status: Some Days   Substances: Nicotine   Substance and Sexual Activity   Alcohol use: Yes    Comment: once a week   Drug use: Yes    Types: Marijuana    Comment: history of cocaine use, been about a year, per patient (12/18/21)   Sexual activity: Not Currently    Birth control/protection: Surgical  Other Topics Concern   Not on file  Social History Narrative   Not on file   Social Drivers of Health   Financial Resource Strain: Medium Risk (08/07/2023)   Overall Financial Resource Strain (CARDIA)    Difficulty of Paying Living Expenses: Somewhat hard  Food Insecurity: No Food Insecurity (01/12/2024)   Hunger Vital Sign    Worried About Running Out of Food in the Last Year: Never true    Ran Out of Food in the Last Year: Never true  Transportation Needs: No Transportation Needs (01/12/2024)   PRAPARE - Administrator, Civil Service (Medical): No    Lack of Transportation (Non-Medical): No  Physical Activity: Inactive (08/07/2023)   Exercise Vital Sign    Days of Exercise per Week: 0 days    Minutes of Exercise per Session: 0 min  Stress: No Stress Concern Present (08/07/2023)   [employer] of Occupational Health - Occupational Stress Questionnaire    Feeling of Stress : Only a little  Social Connections: Moderately Integrated (01/09/2024)   Social Connection and Isolation Panel    Frequency of Communication with Friends and Family: More than three times a week    Frequency of Social Gatherings  with Friends and Family:  More than three times a week    Attends Religious Services: 1 to 4 times per year    Active Member of Clubs or Organizations: No    Attends Engineer, Structural: 1 to 4 times per year    Marital Status: Never married  Recent Concern: Social Connections - Moderately Isolated (12/04/2023)   Social Connection and Isolation Panel    Frequency of Communication with Friends and Family: Twice a week    Frequency of Social Gatherings with Friends and Family: Never    Attends Religious Services: 1 to 4 times per year    Active Member of [employer] or Organizations: No    Attends Banker Meetings: 1 to 4 times per year    Marital Status: Never married    Allergies:  Allergies  Allergen Reactions   Avocado Anaphylaxis   Latex Shortness Of Breath and Rash   Codeine  Nausea Only    Metabolic Disorder Labs: Lab Results  Component Value Date   HGBA1C 6.7 03/12/2024   MPG 131.24 09/01/2023   MPG 105.41 12/18/2021   No results found for: PROLACTIN Lab Results  Component Value Date   CHOL 145 12/26/2023   TRIG 100 12/26/2023   HDL 79 12/26/2023   CHOLHDL 1.8 12/26/2023   VLDL 15 04/16/2016   LDLCALC 48 12/26/2023   LDLCALC 93 06/26/2023   Lab Results  Component Value Date   TSH 3.520 12/11/2023   TSH 11.100 (H) 10/24/2023    Therapeutic Level Labs: No results found for: LITHIUM No results found for: VALPROATE No results found for: CBMZ  Current Medications: Current Outpatient Medications  Medication Sig Dispense Refill   albuterol  (VENTOLIN  HFA) 108 (90 Base) MCG/ACT inhaler INHALE 2 PUFFS into THE lungs EVERY 6 HOURS AS NEEDED wheezing AND SHORTNESS OF BREATH 54 g 1   alendronate  (FOSAMAX ) 70 MG tablet Take 1 tablet (70 mg total) by mouth every 7 (seven) days. Take with a full glass of water on an empty stomach. 12 tablet 0   aspirin  EC 81 MG tablet Take 81 mg by mouth in the morning.     atorvastatin  (LIPITOR ) 80 MG tablet Take 1 tablet (80 mg total)  by mouth daily. 90 tablet 3   azithromycin  (ZITHROMAX ) 250 MG tablet Take 1 tablet (250 mg total) by mouth daily. 90 tablet 1   busPIRone  (BUSPAR ) 15 MG tablet Take 2 tablets (30 mg total) by mouth in the morning AND 1 tablet (15 mg total) daily at 12 noon AND 1 tablet (15 mg total) every evening. 360 tablet 0   [START ON 06/03/2024] clonazePAM  (KLONOPIN ) 0.5 MG tablet Take 1 tablet (0.5 mg total) by mouth 2 (two) times daily as needed. 60 tablet 0   doxepin  (SINEQUAN ) 75 MG capsule Take 1 capsule (75 mg total) by mouth at bedtime. 90 capsule 0   escitalopram  (LEXAPRO ) 20 MG tablet Take 1 tablet (20 mg total) by mouth daily. 90 tablet 0   ezetimibe  (ZETIA ) 10 MG tablet Take 1 tablet (10 mg total) by mouth daily. 90 tablet 3   Fluticasone -Umeclidin-Vilant (TRELEGY ELLIPTA ) 200-62.5-25 MCG/ACT AEPB Inhale 1 puff into the lungs daily. 60 each 11   furosemide  (LASIX ) 40 MG tablet Take 1 tablet (40 mg total) by mouth daily. 90 tablet 1   gabapentin  (NEURONTIN ) 600 MG tablet Take 1 tablet (600 mg total) by mouth 2 (two) times daily. 180 tablet 2   hydrOXYzine  (ATARAX ) 25 MG tablet Take 1  tablet (25 mg total) by mouth at bedtime as needed. 30 tablet 2   ipratropium-albuterol  (DUONEB) 0.5-2.5 (3) MG/3ML SOLN Take 3 mLs by nebulization every 8 (eight) hours. (Patient taking differently: Take 3 mLs by nebulization every 8 (eight) hours.) 90 mL 1   levothyroxine  (SYNTHROID ) 112 MCG tablet Take 1 tablet (112 mcg total) by mouth daily. 90 tablet 2   metFORMIN  (GLUCOPHAGE ) 500 MG tablet Take 1 tablet (500 mg total) by mouth 2 (two) times daily with a meal. 180 tablet 1   mirtazapine  (REMERON ) 45 MG tablet Take 1 tablet (45 mg total) by mouth at bedtime. 90 tablet 0   montelukast  (SINGULAIR ) 10 MG tablet Take 1 tablet (10 mg total) by mouth at bedtime. 90 tablet 2   nebivolol  (BYSTOLIC ) 10 MG tablet Take 1 tablet (10 mg total) by mouth daily. 90 tablet 3   nitroGLYCERIN  (NITROSTAT ) 0.4 MG SL tablet Place 1 tablet  (0.4 mg total) under the tongue every 5 (five) minutes as needed for chest pain. 25 tablet 3   omeprazole  (PRILOSEC) 40 MG capsule Take 1 capsule (40 mg total) by mouth 2 (two) times daily before a meal. NEED APPOINTMENT FOR ADDITIONAL REFILLS 60 capsule 0   OXYGEN  Inhale 2-3 L/min into the lungs continuous.     ticagrelor  (BRILINTA ) 60 MG TABS tablet Take 1 tablet (60 mg total) by mouth 2 (two) times daily. 180 tablet 2   varenicline  (CHANTIX ) 1 MG tablet Take 1 tablet (1 mg total) by mouth 2 (two) times daily. (Patient not taking: Reported on 05/13/2024) 60 tablet 3   No current facility-administered medications for this visit.     Musculoskeletal: Strength & Muscle Tone: Unable to assess due to telemedicine visit Gait & Station: Unable to assess due to telemedicine visit Patient leans: Unable to assess due to telemedicine visit  Psychiatric Specialty Exam: Review of Systems  Psychiatric/Behavioral:  Positive for dysphoric mood. Negative for decreased concentration, hallucinations, self-injury, sleep disturbance and suicidal ideas. The patient is nervous/anxious. The patient is not hyperactive.     There were no vitals taken for this visit.There is no height or weight on file to calculate BMI.  General Appearance: Casual  Eye Contact:  Good  Speech:  Clear and Coherent and Normal Rate  Volume:  Normal  Mood:  Anxious and Depressed  Affect:  Appropriate  Thought Process:  Coherent, Goal Directed, and Descriptions of Associations: Intact  Orientation:  Full (Time, Place, and Person)  Thought Content: WDL   Suicidal Thoughts:  No  Homicidal Thoughts:  No  Memory:  Immediate;   Good Recent;   Good Remote;   Good  Judgement:  Fair  Insight:  Fair  Psychomotor Activity:  Normal  Concentration:  Concentration: Good and Attention Span: Good  Recall:  [employer] of Knowledge: Fair  Language: Fair  Akathisia:  Negative  Handed:  Right  AIMS (if indicated): not done  Assets:   Communication Skills Desire for Improvement Housing  ADL's:  Intact  Cognition: WNL  Sleep:  Fair   Screenings: AUDIT    Flowsheet Row [facility]  Alcohol Use Disorder Identification Test Final Score (AUDIT) 12   CAGE-AID    Flowsheet Row ED to Hosp-Admission (Discharged) from 09/16/2023 in [facility] 4E CV SURGICAL PROGRESSIVE CARE  CAGE-AID Score 0   GAD-7    Flowsheet Row Video Visit from 05/18/2024 in [facility] Office Visit from 03/12/2024 in  [facility] - A Dept Of [place]. [facility] Video Visit from 02/06/2024 in [facility] Telephone Visit  from 01/21/2024 in [facility] POPULATION HEALTH DEPARTMENT Office Visit from 12/11/2023 in [facility] - A Dept Of [place]. [facility]  Total GAD-7 Score 9 1 9 2 3    PHQ2-9    Flowsheet Row Video Visit from 05/18/2024 in [facility] Office Visit from 03/12/2024 in [facility] - A Dept Of [place]. [facility] Video Visit from 02/06/2024 in [facility] Office Visit from 01/28/2024 in [facility] Telephone from 01/12/2024 in [place] POPULATION HEALTH DEPARTMENT  PHQ-2 Total Score 2 0 2 1 0  PHQ-9 Total Score 9 3 8  -- --   Flowsheet Row Video Visit from 05/18/2024 in [facility] Video Visit from 02/06/2024 in [facility] ED from 01/12/2024 in [facility] Emergency Department at [facility]  C-SSRS RISK CATEGORY No Risk No Risk No Risk     Assessment and Plan:   [name] [name] is a 68 year old female with a past psychiatric history significant for insomnia, generalized anxiety disorder, and major depressive disorder who presents to [facility] via virtual video visit for follow-up and medication management.  Patient presents to the encounter stating that she continues to take her medication regularly and denies experiencing any adverse side effects.  She reports that her use of clonazepam  has been especially effective in managing her anxiety.  She denies experiencing any adverse side effects from her use of clonazepam .  Despite currently dealing with a bout of COPD, patient denies overt depressive symptoms nor does she endorse anxiety.  A PHQ-9 screen was performed with the patient scoring a 9.  A GAD-7 screen was also performed with the patient scoring a 9.  Patient endorses stability on her current medication regimen and denies the need for dosage adjustments at this time.  Patient's medications to be e-prescribed to pharmacy of choice.  A [place] Suicide Severity Rating Scale was performed with the patient being considered no risk.  Patient denies suicidal ideations and is able to contract for safety following the completion of the encounter.  Collaboration of Care: Collaboration of Care: Medication Management AEB patient's medications being managed by this provider and Psychiatrist AEB patient being managed by this behavioral health provider  Patient/Guardian was advised Release of Information must be obtained prior to any record release in order to collaborate their care with an outside provider. Patient/Guardian was advised if they have not already done so to contact the registration department to sign all necessary forms in order for us  to release information regarding their care.   Consent: Patient/Guardian gives verbal consent for treatment and assignment of benefits for services provided during this visit. Patient/Guardian expressed understanding and agreed to proceed.   1. GAD (generalized anxiety disorder)  - clonazePAM  (KLONOPIN ) 0.5 MG tablet; Take 1 tablet (0.5 mg total) by mouth 2 (two) times daily as needed.   Dispense: 60 tablet; Refill: 0 - escitalopram  (LEXAPRO ) 20 MG tablet; Take 1 tablet (20 mg total) by mouth daily.  Dispense: 90 tablet; Refill: 0 - busPIRone  (BUSPAR ) 15 MG tablet; Take 2 tablets (30 mg total) by mouth in the morning AND 1 tablet (15 mg total) daily at 12 noon AND 1 tablet (15 mg total) every  evening.  Dispense: 360 tablet; Refill: 0 - mirtazapine  (REMERON ) 45 MG tablet; Take 1 tablet (45 mg total) by mouth at bedtime.  Dispense: 90 tablet; Refill: 0  2. Major depressive disorder, recurrent, in full remission with anxious distress  - escitalopram  (LEXAPRO ) 20 MG tablet; Take 1 tablet (20 mg total) by mouth daily.  Dispense: 90 tablet; Refill: 0 - busPIRone  (BUSPAR ) 15 MG tablet; Take 2 tablets (30 mg total) by mouth in the morning AND 1 tablet (15 mg total) daily at 12 noon AND 1 tablet (15 mg total) every evening.  Dispense: 360 tablet; Refill: 0 - mirtazapine  (REMERON ) 45 MG tablet; Take 1 tablet (45 mg total) by mouth at bedtime.  Dispense: 90 tablet; Refill: 0 - doxepin  (SINEQUAN ) 75 MG capsule; Take 1 capsule (75 mg total) by mouth at bedtime.  Dispense: 90 capsule; Refill: 0  3. Insomnia, unspecified type  - hydrOXYzine  (ATARAX ) 25 MG tablet; Take 1 tablet (25 mg total) by mouth at bedtime as needed.  Dispense: 30 tablet; Refill: 2  4. Long-term current use of benzodiazepine (Primary) Urine drug screen pending  Patient to follow up in 3 months Provider spent a total of 17 minutes with the patient/reviewing patient's chart  [name] FORBES Bolster, PA 05/18/2024, 4:00 PM

## 2024-05-19 ENCOUNTER — Other Ambulatory Visit: Payer: Self-pay

## 2024-05-19 ENCOUNTER — Other Ambulatory Visit (HOSPITAL_COMMUNITY): Payer: Self-pay

## 2024-05-19 ENCOUNTER — Encounter: Payer: Self-pay | Admitting: Hematology

## 2024-05-20 ENCOUNTER — Other Ambulatory Visit: Payer: Self-pay

## 2024-05-21 ENCOUNTER — Telehealth: Admitting: Physician Assistant

## 2024-05-21 DIAGNOSIS — R3989 Other symptoms and signs involving the genitourinary system: Secondary | ICD-10-CM | POA: Diagnosis not present

## 2024-05-21 MED ORDER — CEPHALEXIN 500 MG PO CAPS: 500.0000 mg | ORAL_CAPSULE | Freq: Two times a day (BID) | ORAL | 0 refills | Status: DC

## 2024-05-21 NOTE — Progress Notes (Signed)
 We are sorry that you are not feeling well.  Here is how we plan to help!  Based on what you shared with me it looks like you most likely have a simple urinary tract infection.  A UTI (Urinary Tract Infection) is a bacterial infection of the bladder.  Most cases of urinary tract infections are simple to treat but a key part of your care is to encourage you to drink plenty of fluids and watch your symptoms carefully.  I have prescribed Keflex  500 mg twice a day for 7 days.  Your symptoms should gradually improve. Call us  if the burning in your urine worsens, you develop worsening fever, back pain or pelvic pain or if your symptoms do not resolve after completing the antibiotic.  Urinary tract infections can be prevented by drinking plenty of water to keep your body hydrated.  Also be sure when you wipe, wipe from front to back and don't hold it in!  If possible, empty your bladder every 4 hours.  Your e-visit answers were reviewed by a board certified advanced clinical practitioner to complete your personal care plan.  Depending on the condition, your plan could have included both over the counter or prescription medications.  If there is a problem please reply  once you have received a response from your provider.  Your safety is important to us .  If you have drug allergies check your prescription carefully.    You can use MyChart to ask questions about today's visit, request a non-urgent call back, or ask for a work or school excuse for 24 hours related to this e-Visit. If it has been greater than 24 hours you will need to follow up with your provider, or enter a new e-Visit to address those concerns.   You will get an e-mail in the next two days asking about your experience.  I hope that your e-visit has been valuable and will speed your recovery. Thank you for using e-visits.  I have spent 5 minutes in review of e-visit questionnaire, review and updating patient chart, medical decision  making and response to patient.   Delon CHRISTELLA Dickinson, PA-C

## 2024-05-24 ENCOUNTER — Ambulatory Visit (INDEPENDENT_AMBULATORY_CARE_PROVIDER_SITE_OTHER): Admitting: Pulmonary Disease

## 2024-05-24 ENCOUNTER — Telehealth: Payer: Self-pay

## 2024-05-24 ENCOUNTER — Encounter: Payer: Self-pay | Admitting: Pulmonary Disease

## 2024-05-24 ENCOUNTER — Other Ambulatory Visit (HOSPITAL_COMMUNITY): Payer: Self-pay

## 2024-05-24 VITALS — BP 120/60 | HR 72 | Ht 61.0 in | Wt 166.0 lb

## 2024-05-24 DIAGNOSIS — J441 Chronic obstructive pulmonary disease with (acute) exacerbation: Secondary | ICD-10-CM | POA: Diagnosis not present

## 2024-05-24 DIAGNOSIS — J4489 Other specified chronic obstructive pulmonary disease: Secondary | ICD-10-CM

## 2024-05-24 DIAGNOSIS — Z5181 Encounter for therapeutic drug level monitoring: Secondary | ICD-10-CM

## 2024-05-24 DIAGNOSIS — F17210 Nicotine dependence, cigarettes, uncomplicated: Secondary | ICD-10-CM | POA: Diagnosis not present

## 2024-05-24 DIAGNOSIS — J45909 Unspecified asthma, uncomplicated: Secondary | ICD-10-CM | POA: Diagnosis not present

## 2024-05-24 MED ORDER — PREDNISONE 10 MG PO TABS
ORAL_TABLET | ORAL | 0 refills | Status: AC
  Filled 2024-05-24: qty 30, 12d supply, fill #0

## 2024-05-24 NOTE — Progress Notes (Signed)
 Established Patient Pulmonology Office Visit   Subjective:  Patient ID: [name] [name], female    DOB: Apr 18, 1956  MRN: [id number]  CC:  Chief Complaint  Patient presents with   Medical Management of Chronic Issues    Pt states been coughing , wheezing / butter yellow gunk     Discussed the use of AI scribe software for clinical note transcription with the patient, who gave verbal consent to proceed.  History of Present Illness [name] [name] is a 68 year old female with COPD who presents for follow up of asthma-COPD overlap syndrome.   She experiences a persistent cough throughout the day and night, described as a 'tickle' that is difficult to alleviate. The cough is mostly dry with occasional mucus production. Shortness of breath accompanies the cough. She completed a prednisone  taper at the end of September into October and has not been on steroids since.  Her current medications include azithromycin  250 mg daily, Trelegy one puff daily, and a nebulizer with Ohtuvayre  twice a day. She uses albuterol  as needed. There was a brief interruption in her azithromycin  treatment due to prescription issues, but she has resumed it.  She reports sinus drainage and has been using Mucinex , although she is unsure of its effectiveness. She has completed a whole bottle of Mucinex .  She currently smokes about nine cigarettes a day. She is out of nicotine  patches but has been using nicotine  gum. She mentions significant earwax buildup affecting her hearing, requiring increased TV volume.    Review of Systems  Constitutional:  Negative for chills, fever and malaise/fatigue.  HENT:  Positive for congestion.   Respiratory:  Positive for cough, shortness of breath and wheezing.   Cardiovascular:  Negative for chest pain and leg swelling.  Gastrointestinal:  Negative for abdominal pain, heartburn, nausea and vomiting.      Current Outpatient Medications:    OHTUVAYRE  3 MG/2.5ML SUSP, , Disp: , Rfl:     predniSONE  (DELTASONE ) 10 MG tablet, Take 4 tablets (40 mg total) by mouth daily with breakfast for 3 days, THEN 3 tablets (30 mg total) daily with breakfast for 3 days, THEN 2 tablets (20 mg total) daily with breakfast for 3 days, THEN 1 tablet (10 mg total) daily with breakfast for 3 days., Disp: 30 tablet, Rfl: 0   albuterol  (VENTOLIN  HFA) 108 (90 Base) MCG/ACT inhaler, INHALE 2 PUFFS into THE lungs EVERY 6 HOURS AS NEEDED wheezing AND SHORTNESS OF BREATH, Disp: 54 g, Rfl: 1   alendronate  (FOSAMAX ) 70 MG tablet, Take 1 tablet (70 mg total) by mouth every 7 (seven) days. Take with a full glass of water on an empty stomach., Disp: 12 tablet, Rfl: 0   aspirin  EC 81 MG tablet, Take 81 mg by mouth in the morning., Disp: , Rfl:    atorvastatin  (LIPITOR ) 80 MG tablet, Take 1 tablet (80 mg total) by mouth daily., Disp: 90 tablet, Rfl: 3   azithromycin  (ZITHROMAX ) 250 MG tablet, Take 1 tablet (250 mg total) by mouth daily., Disp: 90 tablet, Rfl: 1   busPIRone  (BUSPAR ) 15 MG tablet, Take 2 tablets (30 mg total) by mouth in the morning AND 1 tablet (15 mg total) daily at 12 noon AND 1 tablet (15 mg total) every evening., Disp: 360 tablet, Rfl: 0   cephALEXin  (KEFLEX ) 500 MG capsule, Take 1 capsule (500 mg total) by mouth 2 (two) times daily., Disp: 14 capsule, Rfl: 0   [START ON 06/03/2024] clonazePAM  (KLONOPIN ) 0.5 MG tablet, Take  1 tablet (0.5 mg total) by mouth 2 (two) times daily as needed., Disp: 60 tablet, Rfl: 0   doxepin  (SINEQUAN ) 75 MG capsule, Take 1 capsule (75 mg total) by mouth at bedtime., Disp: 90 capsule, Rfl: 0   escitalopram  (LEXAPRO ) 20 MG tablet, Take 1 tablet (20 mg total) by mouth daily., Disp: 90 tablet, Rfl: 0   ezetimibe  (ZETIA ) 10 MG tablet, Take 1 tablet (10 mg total) by mouth daily., Disp: 90 tablet, Rfl: 3   Fluticasone -Umeclidin-Vilant (TRELEGY ELLIPTA ) 200-62.5-25 MCG/ACT AEPB, Inhale 1 puff into the lungs daily., Disp: 60 each, Rfl: 11   furosemide  (LASIX ) 40 MG tablet, Take  1 tablet (40 mg total) by mouth daily., Disp: 90 tablet, Rfl: 1   gabapentin  (NEURONTIN ) 600 MG tablet, Take 1 tablet (600 mg total) by mouth 2 (two) times daily., Disp: 180 tablet, Rfl: 2   hydrOXYzine  (ATARAX ) 25 MG tablet, Take 1 tablet (25 mg total) by mouth at bedtime as needed., Disp: 30 tablet, Rfl: 2   ipratropium-albuterol  (DUONEB) 0.5-2.5 (3) MG/3ML SOLN, Take 3 mLs by nebulization every 8 (eight) hours. (Patient taking differently: Take 3 mLs by nebulization every 8 (eight) hours.), Disp: 90 mL, Rfl: 1   levothyroxine  (SYNTHROID ) 112 MCG tablet, Take 1 tablet (112 mcg total) by mouth daily., Disp: 90 tablet, Rfl: 2   metFORMIN  (GLUCOPHAGE ) 500 MG tablet, Take 1 tablet (500 mg total) by mouth 2 (two) times daily with a meal., Disp: 180 tablet, Rfl: 1   mirtazapine  (REMERON ) 45 MG tablet, Take 1 tablet (45 mg total) by mouth at bedtime., Disp: 90 tablet, Rfl: 0   montelukast  (SINGULAIR ) 10 MG tablet, Take 1 tablet (10 mg total) by mouth at bedtime., Disp: 90 tablet, Rfl: 2   nebivolol  (BYSTOLIC ) 10 MG tablet, Take 1 tablet (10 mg total) by mouth daily., Disp: 90 tablet, Rfl: 3   nitroGLYCERIN  (NITROSTAT ) 0.4 MG SL tablet, Place 1 tablet (0.4 mg total) under the tongue every 5 (five) minutes as needed for chest pain., Disp: 25 tablet, Rfl: 3   omeprazole  (PRILOSEC) 40 MG capsule, Take 1 capsule (40 mg total) by mouth 2 (two) times daily before a meal. NEED APPOINTMENT FOR ADDITIONAL REFILLS, Disp: 60 capsule, Rfl: 0   OXYGEN , Inhale 2-3 L/min into the lungs continuous., Disp: , Rfl:    ticagrelor  (BRILINTA ) 60 MG TABS tablet, Take 1 tablet (60 mg total) by mouth 2 (two) times daily., Disp: 180 tablet, Rfl: 2   varenicline  (CHANTIX ) 1 MG tablet, Take 1 tablet (1 mg total) by mouth 2 (two) times daily. (Patient not taking: Reported on 05/13/2024), Disp: 60 tablet, Rfl: 3      Objective:  BP 120/60   Pulse 72   Ht 5' 1 (1.549 m)   Wt 166 lb (75.3 kg)   SpO2 99%   PF (!) 3 L/min   BMI  31.37 kg/m     Physical Exam Constitutional:      General: She is not in acute distress.    Appearance: Normal appearance.  Eyes:     General: No scleral icterus.    Conjunctiva/sclera: Conjunctivae normal.  Cardiovascular:     Rate and Rhythm: Normal rate and regular rhythm.  Pulmonary:     Breath sounds: Wheezing and rhonchi present. No rales.  Musculoskeletal:     Right lower leg: No edema.     Left lower leg: No edema.  Skin:    General: Skin is warm and dry.  Neurological:  General: No focal deficit present.    Diagnostic Review:   Abs Eosinophil count 200 in 09/2023    Assessment & Plan:   Assessment & Plan Asthma-COPD overlap syndrome (HCC)  Orders:   IgE; Future   predniSONE  (DELTASONE ) 10 MG tablet; Take 4 tablets (40 mg total) by mouth daily with breakfast for 3 days, THEN 3 tablets (30 mg total) daily with breakfast for 3 days, THEN 2 tablets (20 mg total) daily with breakfast for 3 days, THEN 1 tablet (10 mg total) daily with breakfast for 3 days.   Amb Referral to Clinical Pharmacist  Cigarette smoker     Medication monitoring encounter  Orders:   Ambulatory referral to Audiology   Assessment and Plan Assessment & Plan Chronic obstructive pulmonary disease (COPD) with acute exacerbation She has developed steroid dependent asthma/COPD with frequent flares. Absolute eosinophil level has been in 200s this year.  - started extended steroid taper - Continue azithromycin  250 mg daily, Trelegy 1 puff daily, and Ohtuvayre . - Check IgE level  - Initiated paperwork for Dupixent qualification and referral to pharmacy team  Nicotine  dependence, cigarettes Nicotine  dependence with current use of approximately nine cigarettes per day. Challenges with obtaining nicotine  patches noted. - Continue nicotine  gum as needed.  Long Term Drug Monitoring - refer to audiology for hearing testing while on azithromycin  - recommend ear wax removal at upcoming PCP  visit    Return in about 1 month (around 06/23/2024) for f/u visit Dr. Kara.   Dorn [name] Kara, MD

## 2024-05-24 NOTE — Telephone Encounter (Signed)
 Dupixent paperwork handed into pharmacy

## 2024-05-24 NOTE — Patient Instructions (Signed)
 Continue azithromycin  250mg , 1 tablet daily  Start prednisone  taper  We will work on scheduling you with our pharmacy team to get started on Dupixent therapy  Continue to work on quitting smoking using chantix  twice daily, patches and as needed lozenges  Continue trelegy inhaler 1 puff daily - rinse mouth out after each use  Continue Ohtuvayre  nebulizer treatments twice daily  Continue albuterol  inhaler 1-2 puffs every 4-6 hours as needed  Follow up in 1 month

## 2024-05-25 ENCOUNTER — Telehealth: Payer: Self-pay

## 2024-05-25 DIAGNOSIS — J4489 Other specified chronic obstructive pulmonary disease: Secondary | ICD-10-CM

## 2024-05-25 LAB — IGE: IgE (Immunoglobulin E), Serum: 111 kU/L (ref <=114)

## 2024-05-25 NOTE — Telephone Encounter (Signed)
 Received Dupixent new start paperwork. Submitted a Prior Authorization request to [employer] for DUPIXENT via CoverMyMeds. Will update once we receive a response.  Key: [id number]

## 2024-05-26 ENCOUNTER — Ambulatory Visit: Attending: Pulmonary Disease

## 2024-05-26 ENCOUNTER — Other Ambulatory Visit: Payer: Self-pay

## 2024-05-26 ENCOUNTER — Other Ambulatory Visit (HOSPITAL_COMMUNITY): Payer: Self-pay

## 2024-05-26 DIAGNOSIS — J449 Chronic obstructive pulmonary disease, unspecified: Secondary | ICD-10-CM

## 2024-05-26 DIAGNOSIS — J45909 Unspecified asthma, uncomplicated: Secondary | ICD-10-CM

## 2024-05-26 MED ORDER — DUPIXENT 300 MG/2ML ~~LOC~~ SOAJ
300.0000 mg | SUBCUTANEOUS | 3 refills | Status: AC
  Filled 2024-05-27 – 2024-06-18 (×2): qty 4, 28d supply, fill #0
  Filled 2024-07-13: qty 4, 28d supply, fill #1
  Filled 2024-08-20: qty 4, 28d supply, fill #2

## 2024-05-26 MED ORDER — DUPIXENT 300 MG/2ML ~~LOC~~ SOAJ
SUBCUTANEOUS | 0 refills | Status: DC
  Filled 2024-05-27: qty 6, 28d supply, fill #0

## 2024-05-26 NOTE — Progress Notes (Signed)
 [facility] - New Start Biologic  Referring Provider: Dr. Kara  Virtual Visit via Telephone Note  I connected with Nathanel Carnero's daughter/caretaker on 05/26/24 at 11:30 AM EST by telephone and verified that I am speaking with the correct person using two identifiers.   I discussed the limitations, risks, security and privacy concerns of performing an evaluation and management service by telephone and the availability of in person appointments. I also discussed with the patient that there may be a patient responsible charge related to this service. The patient expressed understanding and agreed to proceed.  HPI: [name] [name] is a 68 y.o. female whose caretaker presents to the pharmacotherapy clinic via telephone for new start Dupixent scheduling and initial education. Last OV with Dr. Kara was on 05/24/24.   Indication: COPD-asthma overlap Dosing: 600mg  [place] on day 0 in clinic, followed by 300mg  [place] on day 14 and every 14 days thereafter  Patient's current respiratory regimen:  - Trelegy 200-62.5-25 mcg/act, Inhale 1 puff into the lungs once daily - Ohtuvayre  3mg  by nebulizer twice daily  - Ventolin  HFA 108 mcg/act, Inhale 2 puffs into the lungs q6h PRN wheezing and SOB   Patient Active Problem List   Diagnosis Date Noted   Hypokalemia 01/08/2024   Acute on chronic heart failure with preserved ejection fraction (HFpEF, >= 50%) (HCC) 12/05/2023   At risk for polypharmacy 12/04/2023   Long-term current use of antidepressant 11/17/2023   Long-term current use of benzodiazepine 11/17/2023   Sigmoid diverticulitis 11/12/2023   Acute kidney injury superimposed on chronic kidney disease 11/12/2023   CKD (chronic kidney disease) stage 2, GFR 60-89 ml/min 11/12/2023   Sepsis secondary to sigmoid diverticulitis (HCC) 11/12/2023   Acute on chronic hypoxic respiratory failure (HCC) 10/05/2023   Pneumothorax, right 09/16/2023   COPD with acute exacerbation (HCC) 09/01/2023    Chronic hypoxic respiratory failure (HCC) 09/01/2023   Continuous dependence on cigarette smoking 09/01/2023   History of CAD (coronary artery disease) 09/01/2023   CAP (community acquired pneumonia) 09/01/2023   Full code status 06/14/2023   Acute hypoxic respiratory failure (HCC) 06/13/2023   Acute exacerbation of chronic obstructive pulmonary disease (COPD) (HCC) 06/12/2023   Nicotine  dependence 05/03/2023   Substance abuse (HCC) 09/10/2022   Iron  deficiency anemia 11/02/2021   Major depressive disorder, recurrent, in full remission with anxious distress 05/31/2021   Chronic neck pain 02/26/2021   GAD (generalized anxiety disorder) 01/08/2021   Esophageal dysphagia 04/28/2019   Bipolar disorder (HCC) 09/17/2018   Insomnia 10/28/2017   Prediabetes 05/28/2017   QT prolongation 03/25/2017   Psoriasis 06/22/2015   COPD (chronic obstructive pulmonary disease) (HCC)GOLD E 05/18/2015   Anxiety and depression 07/10/2013   Essential hypertension 06/07/2013   Hyperlipidemia 06/07/2013   Asthma, chronic 06/07/2013   Emphysema lung (HCC) 06/07/2013   Chronic back pain 06/07/2013   CAD S/P percutaneous coronary angioplasty 06/07/2013   GERD (gastroesophageal reflux disease) 06/07/2013   Breast lump on left side at 3 o'clock position 10/13/2012   S/P abdominal hysterectomy and right salpingo-oophorectomy 08/29/2011   COPD exacerbation (HCC) 09/15/2009   TOBACCO ABUSE 07/17/2009   Chronic rhinitis 07/17/2009   Lung nodule < 6cm on CT 07/17/2009   Hypothyroidism 12/22/2008   Chronic depression 12/22/2008    Patient's Medications  New Prescriptions   No medications on file  Previous Medications   ALBUTEROL  (VENTOLIN  HFA) 108 (90 BASE) MCG/ACT INHALER    INHALE 2 PUFFS into THE lungs EVERY 6 HOURS AS NEEDED wheezing  AND SHORTNESS OF BREATH   ALENDRONATE  (FOSAMAX ) 70 MG TABLET    Take 1 tablet (70 mg total) by mouth every 7 (seven) days. Take with a full glass of water on an empty  stomach.   ASPIRIN  EC 81 MG TABLET    Take 81 mg by mouth in the morning.   ATORVASTATIN  (LIPITOR ) 80 MG TABLET    Take 1 tablet (80 mg total) by mouth daily.   AZITHROMYCIN  (ZITHROMAX ) 250 MG TABLET    Take 1 tablet (250 mg total) by mouth daily.   BUSPIRONE  (BUSPAR ) 15 MG TABLET    Take 2 tablets (30 mg total) by mouth in the morning AND 1 tablet (15 mg total) daily at 12 noon AND 1 tablet (15 mg total) every evening.   CEPHALEXIN  (KEFLEX ) 500 MG CAPSULE    Take 1 capsule (500 mg total) by mouth 2 (two) times daily.   CLONAZEPAM  (KLONOPIN ) 0.5 MG TABLET    Take 1 tablet (0.5 mg total) by mouth 2 (two) times daily as needed.   DOXEPIN  (SINEQUAN ) 75 MG CAPSULE    Take 1 capsule (75 mg total) by mouth at bedtime.   ESCITALOPRAM  (LEXAPRO ) 20 MG TABLET    Take 1 tablet (20 mg total) by mouth daily.   EZETIMIBE  (ZETIA ) 10 MG TABLET    Take 1 tablet (10 mg total) by mouth daily.   FLUTICASONE -UMECLIDIN-VILANT (TRELEGY ELLIPTA ) 200-62.5-25 MCG/ACT AEPB    Inhale 1 puff into the lungs daily.   FUROSEMIDE  (LASIX ) 40 MG TABLET    Take 1 tablet (40 mg total) by mouth daily.   GABAPENTIN  (NEURONTIN ) 600 MG TABLET    Take 1 tablet (600 mg total) by mouth 2 (two) times daily.   HYDROXYZINE  (ATARAX ) 25 MG TABLET    Take 1 tablet (25 mg total) by mouth at bedtime as needed.   IPRATROPIUM-ALBUTEROL  (DUONEB) 0.5-2.5 (3) MG/3ML SOLN    Take 3 mLs by nebulization every 8 (eight) hours.   LEVOTHYROXINE  (SYNTHROID ) 112 MCG TABLET    Take 1 tablet (112 mcg total) by mouth daily.   METFORMIN  (GLUCOPHAGE ) 500 MG TABLET    Take 1 tablet (500 mg total) by mouth 2 (two) times daily with a meal.   MIRTAZAPINE  (REMERON ) 45 MG TABLET    Take 1 tablet (45 mg total) by mouth at bedtime.   MONTELUKAST  (SINGULAIR ) 10 MG TABLET    Take 1 tablet (10 mg total) by mouth at bedtime.   NEBIVOLOL  (BYSTOLIC ) 10 MG TABLET    Take 1 tablet (10 mg total) by mouth daily.   NITROGLYCERIN  (NITROSTAT ) 0.4 MG SL TABLET    Place 1 tablet (0.4 mg  total) under the tongue every 5 (five) minutes as needed for chest pain.   OHTUVAYRE  3 MG/2.5ML SUSP       OMEPRAZOLE  (PRILOSEC) 40 MG CAPSULE    Take 1 capsule (40 mg total) by mouth 2 (two) times daily before a meal. NEED APPOINTMENT FOR ADDITIONAL REFILLS   OXYGEN     Inhale 2-3 L/min into the lungs continuous.   PREDNISONE  (DELTASONE ) 10 MG TABLET    Take 4 tablets (40 mg total) by mouth daily with breakfast for 3 days, THEN 3 tablets (30 mg total) daily with breakfast for 3 days, THEN 2 tablets (20 mg total) daily with breakfast for 3 days, THEN 1 tablet (10 mg total) daily with breakfast for 3 days.   TICAGRELOR  (BRILINTA ) 60 MG TABS TABLET    Take 1 tablet (60 mg  total) by mouth 2 (two) times daily.   VARENICLINE  (CHANTIX ) 1 MG TABLET    Take 1 tablet (1 mg total) by mouth 2 (two) times daily.  Modified Medications   No medications on file  Discontinued Medications   No medications on file    Allergies: Allergies  Allergen Reactions   Avocado Anaphylaxis   Latex Shortness Of Breath and Rash   Codeine  Nausea Only    Past Medical History: Past Medical History:  Diagnosis Date   Allergy    seasonal   Anemia    Anxiety    takes Lexapro  daily   Arthritis    back, from neck down pass my bra area (03/25/2017)   Asthma    Bartholin gland cyst 08/29/2011   Bruises easily    pt is on Effient    Chronic back pain    herniated nucleus pulposus   Chronic back pain    neck to bra area; lower back (03/25/2017)   Chronic kidney disease    recurrent UTI's this year 2022   COPD (chronic obstructive pulmonary disease) (HCC)    early stages   Coronary artery disease    Depression    takes Klonopin  daily   Diabetes mellitus without complication (HCC)    Diverticulosis    Fibroadenoma of left breast    GERD (gastroesophageal reflux disease)    takes Nexium  daily   H/O hiatal hernia    Heart attack (HCC) 2011   Hemorrhoids    Hernia    Hyperlipidemia    takes Lipitor  daily    Hypertension    takes Losartan  daily and Labetalol  bid   Hypothyroidism    takes Synthroid  daily   Insomnia    hydroxyzine  prn   Joint pain    Pneumonia    couple times (03/25/2017)   Pre-diabetes    just found out 1 wk ago (03/25/2017)   Psoriasis    elbows,knees,back   Shortness of breath    with exertion   Slowing of urinary stream    Stress incontinence    O: Eosinophils Most recent absolute eosinophil count: 100 on 04/28/24  IgE Most recent IgE was 111 on 05/24/24   Assessment/Plan: 1. Patient's daughter who participates in patient's care is present for education. Patient was prescribed Dupixent for COPD-asthma overlap. Reviewed purpose and dosing of Dupixent. Will be administered as a SubQ injection once every 14 days. First dose will be observed in clinic.    Access:  - Obtained through insurance. Copay is: $0 - Can be filled at [facility]: [phone]    Plan:  - START Dupixent 600mg  [place] on day 0 in clinic, followed by 300mg  [place] on day 14 and every 14 days thereafter - Patient scheduled for new start Dupixent appointment at [facility] on 06/02/24 for first dose in clinic. - Rx triaged to [facility] for courier to pulmonary clinic.   I discussed the assessment and treatment plan with caregiver. Provided an opportunity to ask questions and all were answered. Caregiver agreed with the plan and demonstrated an understanding of the instructions.   Caregiver was advised to call back or seek an in-person evaluation if the symptoms worsen or if the condition fails to improve as anticipated.  I provided 10 minutes of non-face-to-face time during this encounter.  Caregiver verbalizes understanding and agreement with plan.   Aleck Puls, PharmD, BCPS, CPP Clinical Pharmacist  [facility]

## 2024-05-26 NOTE — Telephone Encounter (Signed)
 Spoke to patient's daughter - see pharmacotherapy clinic encounter for further details. Scheduled for new start 06/02/24.

## 2024-05-26 NOTE — Addendum Note (Signed)
 Addended by: Thula Stewart L on: 05/26/2024 11:21 AM   Modules accepted: Orders

## 2024-05-26 NOTE — Telephone Encounter (Signed)
 Per test claim copay for 28 day supply is $0. Pt can fill with [facility]

## 2024-05-26 NOTE — Telephone Encounter (Signed)
 Received notification from [employer] regarding a prior authorization for DUPIXENT. Authorization has been APPROVED to 07/14/2025. Approval letter sent to scan center.  Unable to run test claim.  Phone # [phone]

## 2024-05-27 ENCOUNTER — Other Ambulatory Visit: Payer: Self-pay

## 2024-05-27 NOTE — Progress Notes (Signed)
 Specialty Pharmacy Initial Fill Coordination Note  [name] [name] is a 68 y.o. female contacted today regarding initial fill of specialty medication(s) Dupilumab (Dupixent)   Patient requested Courier to Provider Office   Delivery date: 05/31/24   Verified address: [address], [place], [place] [address]   Medication will be filled on: 05/28/24   Patient is aware of $0 copayment.

## 2024-05-28 ENCOUNTER — Other Ambulatory Visit: Payer: Self-pay

## 2024-06-02 ENCOUNTER — Other Ambulatory Visit: Payer: Self-pay

## 2024-06-02 ENCOUNTER — Other Ambulatory Visit: Payer: Self-pay | Admitting: Internal Medicine

## 2024-06-02 ENCOUNTER — Ambulatory Visit

## 2024-06-02 ENCOUNTER — Other Ambulatory Visit (HOSPITAL_COMMUNITY): Payer: Self-pay

## 2024-06-02 DIAGNOSIS — I509 Heart failure, unspecified: Secondary | ICD-10-CM

## 2024-06-02 DIAGNOSIS — J4489 Other specified chronic obstructive pulmonary disease: Secondary | ICD-10-CM

## 2024-06-02 DIAGNOSIS — Z7189 Other specified counseling: Secondary | ICD-10-CM

## 2024-06-02 NOTE — Progress Notes (Signed)
 HPI Patient presents today, accompanied by daughter, to  Pulmonary to see pharmacy team for Dupixent  new start.  Past medical history includes tobacco use and asthma-COPD overlap. Currently smokes about 8 cigarettes/day. Down from 1 ppd at beginning of 2025. Her daughter helps her cut back on smoking cigarettes by providing a set amount and hiding the rest. Smoking since age 68.   Briefly discussed tobacco use - she did not find nicotine  21mg  patch effective in the past, but she was smoking 1 ppd at the time. She has not retrialed patch since she has decreased to 8 cigarettes/day with behavioral interventions and support of her daughter. She uses nicotine  gum PRN, but infrequently as she does not find it effective. We discussed appropriate use to plan ahead of cravings/urges.   Reviewed Dupixent  - education below.  Respiratory Medications Current regimen:  - albuterol  108 mcg/act (Inhale 2 puffs into the lungs every 6 hours PRN wheezing and SOB) - Singulair  10mg  tablet (Take 1 tab by mouth daily at bedtime)  - azithromycin  250mg  daily (Take 1 tab by mouth daily)  - Ohtuvayre  3mg /2.57mL soln (Inhale 3mg  by nebulizer solution twice daily)  - Trelegy 200-62.5-25 mcg/act (Inhale 1 puff once daily)  - prednisone  taper (05/24/24 - 06/05/24)   OBJECTIVE Allergies  Allergen Reactions   Avocado Anaphylaxis   Latex Shortness Of Breath and Rash   Codeine  Nausea Only    Outpatient Encounter Medications as of 06/02/2024  Medication Sig   albuterol  (VENTOLIN  HFA) 108 (90 Base) MCG/ACT inhaler INHALE 2 PUFFS into THE lungs EVERY 6 HOURS AS NEEDED wheezing AND SHORTNESS OF BREATH   alendronate  (FOSAMAX ) 70 MG tablet Take 1 tablet (70 mg total) by mouth every 7 (seven) days. Take with a full glass of water on an empty stomach.   aspirin  EC 81 MG tablet Take 81 mg by mouth in the morning.   atorvastatin  (LIPITOR ) 80 MG tablet Take 1 tablet (80 mg total) by mouth daily.   azithromycin   (ZITHROMAX ) 250 MG tablet Take 1 tablet (250 mg total) by mouth daily.   busPIRone  (BUSPAR ) 15 MG tablet Take 2 tablets (30 mg total) by mouth in the morning AND 1 tablet (15 mg total) daily at 12 noon AND 1 tablet (15 mg total) every evening.   cephALEXin  (KEFLEX ) 500 MG capsule Take 1 capsule (500 mg total) by mouth 2 (two) times daily.   [START ON 06/03/2024] clonazePAM  (KLONOPIN ) 0.5 MG tablet Take 1 tablet (0.5 mg total) by mouth 2 (two) times daily as needed.   doxepin  (SINEQUAN ) 75 MG capsule Take 1 capsule (75 mg total) by mouth at bedtime.   Dupilumab  (DUPIXENT ) 300 MG/2ML SOAJ Inject contents of two pens (600mg ) in the skin on day 0 in clinic. Then inject contents of one pen (300mg ) in the skin on day 14 and every 14 days thereafter. Courier to pulm: [address], [address], [place] [address]. Appt on 06/02/24.   Dupilumab  (DUPIXENT ) 300 MG/2ML SOAJ Inject 300 mg into the skin every 14 (fourteen) days.   escitalopram  (LEXAPRO ) 20 MG tablet Take 1 tablet (20 mg total) by mouth daily.   ezetimibe  (ZETIA ) 10 MG tablet Take 1 tablet (10 mg total) by mouth daily.   Fluticasone -Umeclidin-Vilant (TRELEGY ELLIPTA ) 200-62.5-25 MCG/ACT AEPB Inhale 1 puff into the lungs daily.   furosemide  (LASIX ) 40 MG tablet Take 1 tablet (40 mg total) by mouth daily.   gabapentin  (NEURONTIN ) 600 MG tablet Take 1 tablet (600 mg total) by  mouth 2 (two) times daily.   hydrOXYzine  (ATARAX ) 25 MG tablet Take 1 tablet (25 mg total) by mouth at bedtime as needed.   ipratropium-albuterol  (DUONEB) 0.5-2.5 (3) MG/3ML SOLN Take 3 mLs by nebulization every 8 (eight) hours. (Patient taking differently: Take 3 mLs by nebulization every 8 (eight) hours.)   levothyroxine  (SYNTHROID ) 112 MCG tablet Take 1 tablet (112 mcg total) by mouth daily.   metFORMIN  (GLUCOPHAGE ) 500 MG tablet Take 1 tablet (500 mg total) by mouth 2 (two) times daily with a meal.   mirtazapine  (REMERON ) 45 MG tablet Take 1 tablet (45 mg total) by mouth  at bedtime.   montelukast  (SINGULAIR ) 10 MG tablet Take 1 tablet (10 mg total) by mouth at bedtime.   nebivolol  (BYSTOLIC ) 10 MG tablet Take 1 tablet (10 mg total) by mouth daily.   nitroGLYCERIN  (NITROSTAT ) 0.4 MG SL tablet Place 1 tablet (0.4 mg total) under the tongue every 5 (five) minutes as needed for chest pain.   OHTUVAYRE  3 MG/2.5ML SUSP    omeprazole  (PRILOSEC) 40 MG capsule Take 1 capsule (40 mg total) by mouth 2 (two) times daily before a meal. NEED APPOINTMENT FOR ADDITIONAL REFILLS   OXYGEN  Inhale 2-3 L/min into the lungs continuous.   predniSONE  (DELTASONE ) 10 MG tablet Take 4 tablets (40 mg total) by mouth daily with breakfast for 3 days, THEN 3 tablets (30 mg total) daily with breakfast for 3 days, THEN 2 tablets (20 mg total) daily with breakfast for 3 days, THEN 1 tablet (10 mg total) daily with breakfast for 3 days.   ticagrelor  (BRILINTA ) 60 MG TABS tablet Take 1 tablet (60 mg total) by mouth 2 (two) times daily.   varenicline  (CHANTIX ) 1 MG tablet Take 1 tablet (1 mg total) by mouth 2 (two) times daily. (Patient not taking: Reported on 05/13/2024)   No facility-administered encounter medications on file as of 06/02/2024.     Immunization History  Administered Date(s) Administered   Fluad Quad(high Dose 65+) 08/12/2022   Fluad Trivalent(High Dose 65+) 05/20/2023   INFLUENZA, HIGH DOSE SEASONAL PF 03/12/2024   Influenza Split 08/06/2011   Influenza Whole 04/05/2019   Influenza,inj,Quad PF,6+ Mos 07/10/2013, 08/09/2014, 04/16/2016, 03/27/2017, 04/02/2018, 06/15/2020, 09/14/2021   PFIZER(Purple Top)SARS-COV-2 Vaccination 09/23/2019, 10/14/2019   Pneumococcal Conjugate (Pcv15) 01/17/2023   Pneumococcal Polysaccharide-23 08/07/2011, 03/27/2017   Tdap 03/17/2018   Zoster Recombinant(Shingrix ) 01/17/2023     PFTs     No data to display           Eosinophils Most recent blood eosinophil count was 100 cells/microL taken on 04/28/24, prior results show EOS 200  cells/microL within the last year (09/15/23)  IgE: 111 on 05/24/24  Assessment   Biologics training for dupilumab (Dupixent)  Goals of therapy: Mechanism: human monoclonal IgG4 antibody that inhibits interleukin-4 and interleukin-13 cytokine-induced responses, including release of proinflammatory cytokines, chemokines, and IgE Reviewed that Dupixent is add-on medication and patient must continue maintenance inhaler regimen. Response to therapy: may take 4 months to determine efficacy. Discussed that patients generally feel improvement sooner than 4 months.  Side effects: injection site reaction (6-18%), antibody development (5-16%), ophthalmic conjunctivitis (2-16%), transient blood eosinophilia (1-2%)  Dose: 600mg  at Week 0 (administered today in clinic) followed by 300mg  every 14 days thereafter  Administration/Storage:  Reviewed administration sites of thigh or abdomen (at least 2-3 inches away from abdomen). Reviewed the upper arm is only appropriate if caregiver is administering injection  Do not shake pen/syringe as this could lead to product foaming  or precipitation. Do not use if solution is discolored or contains particulate matter or if window on prefilled pen is yellow (indicates pen has been used).  Reviewed storage of medication in refrigerator. Reviewed that Dupixent can be stored at room temperature in unopened carton for up to 14 days.  Access: Approval of Dupixent through: insurance  Patient self-administered Dupixent 300mg /47ml x 2 (total dose 600mg ) in right upper thigh and left upper thigh using [facility]-supplied medication  Dupixent 300mg /56mL autoinjector pen NDC: 0024-5915-02 Lot: [id number] Expiration: 2026-08-14  Patient monitored for 30 minutes for adverse reaction.  Patient tolerated well.  Patient denies itchiness and irritation at injection.  PLAN Continue Dupixent 300mg  every 14 days.  Next dose is due 06/16/2024 and every 14 days thereafter. Rx sent to: [facility]: [phone] .  Patient provided with pharmacy phone number and advised to call later this week to schedule shipment to home. Patient provided with copay card information to provide to pharmacy if quoted copay exceeds $5 per month. Continue remaining respiratory regimen as prescribed by Dr. Kara. Do NOT stop taking your other medications.  Provided [place] [facility] contact information as they will ship nicotine  replacement therapies to patient. She previously found nicotine  patch ineffective, but was using 1 ppd at the time she last tried patches. Consider retrialing patch.  All questions encouraged and answered.  Instructed patient to reach out with any further questions or concerns.  Thank you for allowing pharmacy to participate in this patient's care.  This appointment required 45 minutes of patient care (this includes precharting, chart review, review of results, face-to-face care, etc.).  Aleck Puls, PharmD, BCPS, CPP Clinical Pharmacist  [facility]

## 2024-06-02 NOTE — Patient Instructions (Signed)
 Your next Dupixent dose is due on 06/16/24, 06/30/24, and every 14 days thereafter.   Please note that after today, each dose will involve only one pen (300mg /71mL) of Dupixent.   This medication is stored in the refrigerator. Please note, you should remove the drug from the refrigerator AT LEAST 30 minutes prior to injection to ensure the injection comes down to room temperature before injecting the medication.   If needed, this medication can be stored at room temperature for no longer than 14 days.   If you miss a dose, you should take Dupixent as soon as you remember it for up to 7 days after your missed dose. Then you can resume your usual schedule.   As a reminder, some side effects may include injection site reaction. Less common side effects include skin reactions/rash, conjunctivitis (pink eye), back pain, joint pain, and headache. If you are struggling with side effects, please call our office: [phone].  It can take several weeks to see the potential benefit of the medication. Please continue your other medications.   CONTINUE all of your other respiratory medications as prescribed by Dr. Kara. Dupixent does NOT replace these medications.  Your prescription will be shipped from [facility]: [phone]. Someone will call to schedule shipment and confirm address. They will mail your medication to your home.  You will need to be seen by your provider in 3 to 4 months to assess how Dupixent is working for you. You have a follow-up appointment scheduled 06/22/24 with Dr. Kara.   Stay up to date on all routine vaccines: influenza, pneumonia, COVID19, Shingles  How to manage an injection site reaction: Remember the 5 C's: COUNTER - leave on the counter at least 30 minutes but up to overnight to bring medication to room temperature. This may help prevent stinging COLD - place something cold (like an ice gel pack or cold water bottle) on the injection site just  before cleansing with alcohol. This may help reduce pain CLARITIN  - use Claritin  (generic name is loratadine ) for the first two weeks of treatment or the day of, the day before, and the day after injecting. This will help to minimize injection site reactions CORTISONE CREAM - apply if injection site is irritated and itching CALL ME - if injection site reaction is bigger than the size of your fist, looks infected, blisters, or if you develop hives

## 2024-06-03 ENCOUNTER — Other Ambulatory Visit (HOSPITAL_COMMUNITY): Payer: Self-pay

## 2024-06-03 ENCOUNTER — Other Ambulatory Visit: Payer: Self-pay

## 2024-06-03 ENCOUNTER — Other Ambulatory Visit (HOSPITAL_COMMUNITY): Payer: Self-pay | Admitting: Emergency Medicine

## 2024-06-03 MED ORDER — FUROSEMIDE 40 MG PO TABS
40.0000 mg | ORAL_TABLET | Freq: Every day | ORAL | 0 refills | Status: DC
  Filled 2024-06-03: qty 90, 90d supply, fill #0

## 2024-06-03 NOTE — Telephone Encounter (Signed)
 Message received from [facility] stating the patient has been re-certified for Access [place]

## 2024-06-03 NOTE — Progress Notes (Signed)
 Paramedicine Encounter    Patient ID: [name] [name], female    DOB: 1956-03-14, 68 y.o.   MRN: [id number]   Complaints NONE  Assessment ATF [name] [name] A&O x 4, skin W&D w/ good color.  She denies chest pain or SOB.  No peripheral edema  Recently seen by her pulmonologist and started on Dupixent .  She is planning on spending Thanksgiving with some of her friends.  Normally she goes with her daughter to her daugther's inlaws.    [name] [name] is doing well w/ her med compliance. Reviewed pending appointments w/ pt and she is aware of dates and times.  Compliance with meds YES  Pill box filled by daughter   Refills needed none  Meds changes since last visit Started Dupixent  by Pulmonologist    Social changes NONE   BP 100/60 (BP Location: Left Arm, Patient Position: Sitting, Cuff Size: Normal)   Pulse 64   Resp 18   Wt 167 lb (75.8 kg)   SpO2 97%   BMI 31.55 kg/m  Weight yesterday- not taken Last visit weight-164lb  ACTION: Home visit completed  [name] [phone] 06/03/24  Patient Care Team: Vicci Barnie [name], MD as PCP - General (Internal Medicine) Burnard Debby LABOR, MD (Inactive) as PCP - Cardiology (Cardiology) Shaaron Lamar HERO, MD as Consulting Physician (Gastroenterology) Onesimo Emaline Brink, MD as Consulting Physician (Hematology) Collene Reginia BRAVO, [place] as Physician Assistant (Physician Assistant) Carolee Heron [name], RN as Case Manager  Patient Active Problem List   Diagnosis Date Noted   Hypokalemia 01/08/2024   Acute on chronic heart failure with preserved ejection fraction (HFpEF, >= 50%) (HCC) 12/05/2023   At risk for polypharmacy 12/04/2023   Long-term current use of antidepressant 11/17/2023   Long-term current use of benzodiazepine 11/17/2023   Sigmoid diverticulitis 11/12/2023   Acute kidney injury superimposed on chronic kidney disease 11/12/2023   CKD (chronic kidney disease) stage 2, GFR 60-89 ml/min 11/12/2023   Sepsis secondary to  sigmoid diverticulitis (HCC) 11/12/2023   Acute on chronic hypoxic respiratory failure (HCC) 10/05/2023   Pneumothorax, right 09/16/2023   COPD with acute exacerbation (HCC) 09/01/2023   Chronic hypoxic respiratory failure (HCC) 09/01/2023   Continuous dependence on cigarette smoking 09/01/2023   History of CAD (coronary artery disease) 09/01/2023   CAP (community acquired pneumonia) 09/01/2023   Full code status 06/14/2023   Acute hypoxic respiratory failure (HCC) 06/13/2023   Acute exacerbation of chronic obstructive pulmonary disease (COPD) (HCC) 06/12/2023   Nicotine  dependence 05/03/2023   Substance abuse (HCC) 09/10/2022   Iron  deficiency anemia 11/02/2021   Major depressive disorder, recurrent, in full remission with anxious distress 05/31/2021   Chronic neck pain 02/26/2021   GAD (generalized anxiety disorder) 01/08/2021   Esophageal dysphagia 04/28/2019   Bipolar disorder (HCC) 09/17/2018   Insomnia 10/28/2017   Prediabetes 05/28/2017   QT prolongation 03/25/2017   Psoriasis 06/22/2015   COPD (chronic obstructive pulmonary disease) (HCC)GOLD E 05/18/2015   Anxiety and depression 07/10/2013   Essential hypertension 06/07/2013   Hyperlipidemia 06/07/2013   Asthma, chronic 06/07/2013   Emphysema lung (HCC) 06/07/2013   Chronic back pain 06/07/2013   CAD S/P percutaneous coronary angioplasty 06/07/2013   GERD (gastroesophageal reflux disease) 06/07/2013   Breast lump on left side at 3 o'clock position 10/13/2012   S/P abdominal hysterectomy and right salpingo-oophorectomy 08/29/2011   COPD exacerbation (HCC) 09/15/2009   TOBACCO ABUSE 07/17/2009   Chronic rhinitis 07/17/2009   Lung nodule < 6cm on CT 07/17/2009  Hypothyroidism 12/22/2008   Chronic depression 12/22/2008    Current Outpatient Medications:    albuterol  (VENTOLIN  HFA) 108 (90 Base) MCG/ACT inhaler, INHALE 2 PUFFS into THE lungs EVERY 6 HOURS AS NEEDED wheezing AND SHORTNESS OF BREATH, Disp: 54 g, Rfl: 1    alendronate  (FOSAMAX ) 70 MG tablet, Take 1 tablet (70 mg total) by mouth every 7 (seven) days. Take with a full glass of water on an empty stomach., Disp: 12 tablet, Rfl: 0   aspirin  EC 81 MG tablet, Take 81 mg by mouth in the morning., Disp: , Rfl:    atorvastatin  (LIPITOR ) 80 MG tablet, Take 1 tablet (80 mg total) by mouth daily., Disp: 90 tablet, Rfl: 3   azithromycin  (ZITHROMAX ) 250 MG tablet, Take 1 tablet (250 mg total) by mouth daily., Disp: 90 tablet, Rfl: 1   busPIRone  (BUSPAR ) 15 MG tablet, Take 2 tablets (30 mg total) by mouth in the morning AND 1 tablet (15 mg total) daily at 12 noon AND 1 tablet (15 mg total) every evening., Disp: 360 tablet, Rfl: 0   cephALEXin  (KEFLEX ) 500 MG capsule, Take 1 capsule (500 mg total) by mouth 2 (two) times daily., Disp: 14 capsule, Rfl: 0   clonazePAM  (KLONOPIN ) 0.5 MG tablet, Take 1 tablet (0.5 mg total) by mouth 2 (two) times daily as needed., Disp: 60 tablet, Rfl: 0   doxepin  (SINEQUAN ) 75 MG capsule, Take 1 capsule (75 mg total) by mouth at bedtime., Disp: 90 capsule, Rfl: 0   Dupilumab (DUPIXENT) 300 MG/2ML SOAJ, Inject 300 mg into the skin every 14 (fourteen) days., Disp: 4 mL, Rfl: 3   escitalopram  (LEXAPRO ) 20 MG tablet, Take 1 tablet (20 mg total) by mouth daily., Disp: 90 tablet, Rfl: 0   ezetimibe  (ZETIA ) 10 MG tablet, Take 1 tablet (10 mg total) by mouth daily., Disp: 90 tablet, Rfl: 3   Fluticasone -Umeclidin-Vilant (TRELEGY ELLIPTA ) 200-62.5-25 MCG/ACT AEPB, Inhale 1 puff into the lungs daily., Disp: 60 each, Rfl: 11   furosemide  (LASIX ) 40 MG tablet, Take 1 tablet (40 mg total) by mouth daily., Disp: 90 tablet, Rfl: 0   gabapentin  (NEURONTIN ) 600 MG tablet, Take 1 tablet (600 mg total) by mouth 2 (two) times daily., Disp: 180 tablet, Rfl: 2   hydrOXYzine  (ATARAX ) 25 MG tablet, Take 1 tablet (25 mg total) by mouth at bedtime as needed., Disp: 30 tablet, Rfl: 2   ipratropium-albuterol  (DUONEB) 0.5-2.5 (3) MG/3ML SOLN, Take 3 mLs by  nebulization every 8 (eight) hours. (Patient taking differently: Take 3 mLs by nebulization every 8 (eight) hours.), Disp: 90 mL, Rfl: 1   levothyroxine  (SYNTHROID ) 112 MCG tablet, Take 1 tablet (112 mcg total) by mouth daily., Disp: 90 tablet, Rfl: 2   metFORMIN  (GLUCOPHAGE ) 500 MG tablet, Take 1 tablet (500 mg total) by mouth 2 (two) times daily with a meal., Disp: 180 tablet, Rfl: 1   mirtazapine  (REMERON ) 45 MG tablet, Take 1 tablet (45 mg total) by mouth at bedtime., Disp: 90 tablet, Rfl: 0   montelukast  (SINGULAIR ) 10 MG tablet, Take 1 tablet (10 mg total) by mouth at bedtime., Disp: 90 tablet, Rfl: 2   nebivolol  (BYSTOLIC ) 10 MG tablet, Take 1 tablet (10 mg total) by mouth daily., Disp: 90 tablet, Rfl: 3   nitroGLYCERIN  (NITROSTAT ) 0.4 MG SL tablet, Place 1 tablet (0.4 mg total) under the tongue every 5 (five) minutes as needed for chest pain., Disp: 25 tablet, Rfl: 3   OHTUVAYRE  3 MG/2.5ML SUSP, , Disp: , Rfl:  omeprazole  (PRILOSEC) 40 MG capsule, Take 1 capsule (40 mg total) by mouth 2 (two) times daily before a meal. NEED APPOINTMENT FOR ADDITIONAL REFILLS, Disp: 60 capsule, Rfl: 0   OXYGEN , Inhale 2-3 L/min into the lungs continuous., Disp: , Rfl:    predniSONE  (DELTASONE ) 10 MG tablet, Take 4 tablets (40 mg total) by mouth daily with breakfast for 3 days, THEN 3 tablets (30 mg total) daily with breakfast for 3 days, THEN 2 tablets (20 mg total) daily with breakfast for 3 days, THEN 1 tablet (10 mg total) daily with breakfast for 3 days., Disp: 30 tablet, Rfl: 0   ticagrelor  (BRILINTA ) 60 MG TABS tablet, Take 1 tablet (60 mg total) by mouth 2 (two) times daily., Disp: 180 tablet, Rfl: 2   varenicline  (CHANTIX ) 1 MG tablet, Take 1 tablet (1 mg total) by mouth 2 (two) times daily. (Patient not taking: Reported on 05/13/2024), Disp: 60 tablet, Rfl: 3 Allergies  Allergen Reactions   Avocado Anaphylaxis   Latex Shortness Of Breath and Rash   Codeine  Nausea Only     Social History    Socioeconomic History   Marital status: Divorced    Spouse name: Not on file   Number of children: 1   Years of education: Not on file   Highest education level: 9th grade  Occupational History   Not on file  Tobacco Use   Smoking status: Every Day    Current packs/day: 0.50    Average packs/day: 0.5 packs/day for 50.0 years (25.0 ttl pk-yrs)    Types: Cigarettes   Smokeless tobacco: Never  Vaping Use   Vaping status: Some Days   Substances: Nicotine   Substance and Sexual Activity   Alcohol use: Yes    Comment: once a week   Drug use: Yes    Types: Marijuana    Comment: history of cocaine use, been about a year, per patient (12/18/21)   Sexual activity: Not Currently    Birth control/protection: Surgical  Other Topics Concern   Not on file  Social History Narrative   Not on file   Social Drivers of Health   Financial Resource Strain: Medium Risk (08/07/2023)   Overall Financial Resource Strain (CARDIA)    Difficulty of Paying Living Expenses: Somewhat hard  Food Insecurity: No Food Insecurity (01/12/2024)   Hunger Vital Sign    Worried About Running Out of Food in the Last Year: Never true    Ran Out of Food in the Last Year: Never true  Transportation Needs: No Transportation Needs (01/12/2024)   PRAPARE - Administrator, Civil Service (Medical): No    Lack of Transportation (Non-Medical): No  Physical Activity: Inactive (08/07/2023)   Exercise Vital Sign    Days of Exercise per Week: 0 days    Minutes of Exercise per Session: 0 min  Stress: No Stress Concern Present (08/07/2023)   [employer] of Occupational Health - Occupational Stress Questionnaire    Feeling of Stress : Only a little  Social Connections: Moderately Integrated (01/09/2024)   Social Connection and Isolation Panel    Frequency of Communication with Friends and Family: More than three times a week    Frequency of Social Gatherings with Friends and Family: More than three times a week     Attends Religious Services: 1 to 4 times per year    Active Member of [employer] or Organizations: No    Attends Banker Meetings: 1 to 4 times per year  Marital Status: Never married  Recent Concern: Social Connections - Moderately Isolated (12/04/2023)   Social Connection and Isolation Panel    Frequency of Communication with Friends and Family: Twice a week    Frequency of Social Gatherings with Friends and Family: Never    Attends Religious Services: 1 to 4 times per year    Active Member of Clubs or Organizations: No    Attends Banker Meetings: 1 to 4 times per year    Marital Status: Never married  Intimate Partner Violence: Not At Risk (01/12/2024)   Humiliation, Afraid, Rape, and Kick questionnaire    Fear of Current or Ex-Partner: No    Emotionally Abused: No    Physically Abused: No    Sexually Abused: No    Physical Exam      Future Appointments  Date Time Provider Department Center  06/21/2024  4:10 PM Vicci Barnie NOVAK, MD [facility] [address]  06/22/2024  8:45 AM Kara Dorn NOVAK, MD [facility] [address]  06/22/2024  4:10 PM [facility] ANNUAL WELLNESS VISIT [facility] [address]  07/13/2024  4:00 PM Floretta Mallard, MD [facility]  05/06/2025  1:30 PM [facility] None  05/06/2025  2:00 PM Onesimo Emaline Brink, MD [facility] None

## 2024-06-04 ENCOUNTER — Other Ambulatory Visit (HOSPITAL_COMMUNITY): Payer: Self-pay

## 2024-06-09 ENCOUNTER — Other Ambulatory Visit (HOSPITAL_COMMUNITY): Payer: Self-pay

## 2024-06-12 ENCOUNTER — Observation Stay (HOSPITAL_COMMUNITY)
Admission: EM | Admit: 2024-06-12 | Discharge: 2024-06-13 | Disposition: A | Discharge status: Home / Self Care | Attending: Emergency Medicine | Admitting: Emergency Medicine

## 2024-06-12 ENCOUNTER — Encounter (HOSPITAL_COMMUNITY): Payer: Self-pay | Admitting: Internal Medicine

## 2024-06-12 ENCOUNTER — Other Ambulatory Visit: Payer: Self-pay

## 2024-06-12 ENCOUNTER — Emergency Department (HOSPITAL_COMMUNITY)

## 2024-06-12 DIAGNOSIS — J441 Chronic obstructive pulmonary disease with (acute) exacerbation: Principal | ICD-10-CM | POA: Diagnosis present

## 2024-06-12 DIAGNOSIS — E039 Hypothyroidism, unspecified: Secondary | ICD-10-CM | POA: Diagnosis not present

## 2024-06-12 DIAGNOSIS — R609 Edema, unspecified: Secondary | ICD-10-CM | POA: Diagnosis not present

## 2024-06-12 DIAGNOSIS — Z6831 Body mass index (BMI) 31.0-31.9, adult: Secondary | ICD-10-CM | POA: Insufficient documentation

## 2024-06-12 DIAGNOSIS — J9611 Chronic respiratory failure with hypoxia: Secondary | ICD-10-CM | POA: Diagnosis not present

## 2024-06-12 DIAGNOSIS — R0689 Other abnormalities of breathing: Secondary | ICD-10-CM | POA: Diagnosis not present

## 2024-06-12 DIAGNOSIS — F1290 Cannabis use, unspecified, uncomplicated: Secondary | ICD-10-CM | POA: Insufficient documentation

## 2024-06-12 DIAGNOSIS — E785 Hyperlipidemia, unspecified: Secondary | ICD-10-CM | POA: Diagnosis not present

## 2024-06-12 DIAGNOSIS — R059 Cough, unspecified: Secondary | ICD-10-CM | POA: Diagnosis not present

## 2024-06-12 DIAGNOSIS — I447 Left bundle-branch block, unspecified: Secondary | ICD-10-CM | POA: Diagnosis not present

## 2024-06-12 DIAGNOSIS — I5032 Chronic diastolic (congestive) heart failure: Secondary | ICD-10-CM | POA: Insufficient documentation

## 2024-06-12 DIAGNOSIS — F1090 Alcohol use, unspecified, uncomplicated: Secondary | ICD-10-CM | POA: Diagnosis not present

## 2024-06-12 DIAGNOSIS — R0602 Shortness of breath: Secondary | ICD-10-CM | POA: Diagnosis not present

## 2024-06-12 DIAGNOSIS — F17210 Nicotine dependence, cigarettes, uncomplicated: Secondary | ICD-10-CM | POA: Insufficient documentation

## 2024-06-12 DIAGNOSIS — Z91040 Latex allergy status: Secondary | ICD-10-CM | POA: Diagnosis not present

## 2024-06-12 DIAGNOSIS — Z79899 Other long term (current) drug therapy: Secondary | ICD-10-CM | POA: Diagnosis not present

## 2024-06-12 DIAGNOSIS — Z7982 Long term (current) use of aspirin: Secondary | ICD-10-CM | POA: Insufficient documentation

## 2024-06-12 DIAGNOSIS — R001 Bradycardia, unspecified: Secondary | ICD-10-CM | POA: Diagnosis not present

## 2024-06-12 DIAGNOSIS — I110 Hypertensive heart disease with heart failure: Secondary | ICD-10-CM | POA: Insufficient documentation

## 2024-06-12 DIAGNOSIS — K219 Gastro-esophageal reflux disease without esophagitis: Secondary | ICD-10-CM | POA: Insufficient documentation

## 2024-06-12 DIAGNOSIS — I4581 Long QT syndrome: Secondary | ICD-10-CM | POA: Insufficient documentation

## 2024-06-12 DIAGNOSIS — Z7984 Long term (current) use of oral hypoglycemic drugs: Secondary | ICD-10-CM | POA: Diagnosis not present

## 2024-06-12 DIAGNOSIS — F418 Other specified anxiety disorders: Secondary | ICD-10-CM | POA: Diagnosis not present

## 2024-06-12 DIAGNOSIS — E1142 Type 2 diabetes mellitus with diabetic polyneuropathy: Secondary | ICD-10-CM | POA: Insufficient documentation

## 2024-06-12 DIAGNOSIS — I959 Hypotension, unspecified: Secondary | ICD-10-CM | POA: Diagnosis not present

## 2024-06-12 DIAGNOSIS — E66811 Obesity, class 1: Secondary | ICD-10-CM | POA: Diagnosis not present

## 2024-06-12 DIAGNOSIS — R531 Weakness: Secondary | ICD-10-CM | POA: Diagnosis not present

## 2024-06-12 LAB — I-STAT VENOUS BLOOD GAS, ED
Acid-Base Excess: 3 mmol/L — ABNORMAL HIGH (ref 0.0–2.0)
Bicarbonate: 28.9 mmol/L — ABNORMAL HIGH (ref 20.0–28.0)
Calcium, Ion: 1.06 mmol/L — ABNORMAL LOW (ref 1.15–1.40)
HCT: 35 % — ABNORMAL LOW (ref 36.0–46.0)
Hemoglobin: 11.9 g/dL — ABNORMAL LOW (ref 12.0–15.0)
O2 Saturation: 88 %
Potassium: 4.3 mmol/L (ref 3.5–5.1)
Sodium: 140 mmol/L (ref 135–145)
TCO2: 30 mmol/L (ref 22–32)
pCO2, Ven: 46.5 mmHg (ref 44–60)
pH, Ven: 7.401 (ref 7.25–7.43)
pO2, Ven: 55 mmHg — ABNORMAL HIGH (ref 32–45)

## 2024-06-12 LAB — BASIC METABOLIC PANEL WITH GFR
Anion gap: 12 (ref 5–15)
BUN: 7 mg/dL — ABNORMAL LOW (ref 8–23)
CO2: 23 mmol/L (ref 22–32)
Calcium: 8.5 mg/dL — ABNORMAL LOW (ref 8.9–10.3)
Chloride: 106 mmol/L (ref 98–111)
Creatinine, Ser: 0.65 mg/dL (ref 0.44–1.00)
GFR, Estimated: >60 mL/min (ref >60)
Glucose, Bld: 173 mg/dL — ABNORMAL HIGH (ref 70–99)
Potassium: 4.5 mmol/L (ref 3.5–5.1)
Sodium: 141 mmol/L (ref 135–145)

## 2024-06-12 LAB — CBC WITH DIFFERENTIAL/PLATELET
Abs Immature Granulocytes: 0.04 K/uL (ref 0.00–0.07)
Basophils Absolute: 0.1 K/uL (ref 0.0–0.1)
Basophils Relative: 0 %
Eosinophils Absolute: 0.2 K/uL (ref 0.0–0.5)
Eosinophils Relative: 2 %
HCT: 36.6 % (ref 36.0–46.0)
Hemoglobin: 11.1 g/dL — ABNORMAL LOW (ref 12.0–15.0)
Immature Granulocytes: 0 %
Lymphocytes Relative: 33 %
Lymphs Abs: 4.4 K/uL — ABNORMAL HIGH (ref 0.7–4.0)
MCH: 28.6 pg (ref 26.0–34.0)
MCHC: 30.3 g/dL (ref 30.0–36.0)
MCV: 94.3 fL (ref 80.0–100.0)
Monocytes Absolute: 1.3 K/uL — ABNORMAL HIGH (ref 0.1–1.0)
Monocytes Relative: 10 %
Neutro Abs: 7.4 K/uL (ref 1.7–7.7)
Neutrophils Relative %: 55 %
Platelets: 402 K/uL — ABNORMAL HIGH (ref 150–400)
RBC: 3.88 MIL/uL (ref 3.87–5.11)
RDW: 16.5 % — ABNORMAL HIGH (ref 11.5–15.5)
WBC: 13.4 K/uL — ABNORMAL HIGH (ref 4.0–10.5)
nRBC: 0 % (ref 0.0–0.2)

## 2024-06-12 LAB — GLUCOSE, CAPILLARY: Glucose-Capillary: 341 mg/dL — ABNORMAL HIGH (ref 70–99)

## 2024-06-12 LAB — BRAIN NATRIURETIC PEPTIDE: B Natriuretic Peptide: 81.1 pg/mL (ref 0.0–100.0)

## 2024-06-12 LAB — TROPONIN I (HIGH SENSITIVITY): Troponin I (High Sensitivity): 4 ng/L (ref <18)

## 2024-06-12 MED ORDER — GABAPENTIN 300 MG PO CAPS
300.0000 mg | ORAL_CAPSULE | Freq: Two times a day (BID) | ORAL | Status: DC
  Administered 2024-06-12 – 2024-06-13 (×2): 300 mg via ORAL
  Filled 2024-06-12 (×2): qty 1

## 2024-06-12 MED ORDER — ENOXAPARIN SODIUM 40 MG/0.4ML IJ SOSY
40.0000 mg | PREFILLED_SYRINGE | INTRAMUSCULAR | Status: DC
  Administered 2024-06-12: 40 mg via SUBCUTANEOUS
  Filled 2024-06-12: qty 0.4

## 2024-06-12 MED ORDER — EZETIMIBE 10 MG PO TABS
10.0000 mg | ORAL_TABLET | Freq: Every day | ORAL | Status: DC
  Administered 2024-06-13: 10 mg via ORAL
  Filled 2024-06-12: qty 1

## 2024-06-12 MED ORDER — MIRTAZAPINE 15 MG PO TABS
45.0000 mg | ORAL_TABLET | Freq: Every day | ORAL | Status: DC
  Administered 2024-06-12: 45 mg via ORAL
  Filled 2024-06-12: qty 3

## 2024-06-12 MED ORDER — METHYLPREDNISOLONE SODIUM SUCC 40 MG IJ SOLR
40.0000 mg | Freq: Every day | INTRAMUSCULAR | Status: DC
  Administered 2024-06-12 – 2024-06-13 (×2): 40 mg via INTRAVENOUS
  Filled 2024-06-12 (×2): qty 1

## 2024-06-12 MED ORDER — BUSPIRONE HCL 5 MG PO TABS
15.0000 mg | ORAL_TABLET | Freq: Every evening | ORAL | Status: DC
  Administered 2024-06-12: 15 mg via ORAL
  Filled 2024-06-12: qty 2

## 2024-06-12 MED ORDER — SODIUM CHLORIDE 0.9 % IV SOLN
100.0000 mg | Freq: Two times a day (BID) | INTRAVENOUS | Status: DC
  Administered 2024-06-12 – 2024-06-13 (×2): 100 mg via INTRAVENOUS
  Filled 2024-06-12 (×4): qty 100

## 2024-06-12 MED ORDER — FUROSEMIDE 10 MG/ML IJ SOLN
40.0000 mg | Freq: Once | INTRAMUSCULAR | Status: AC
  Administered 2024-06-12: 40 mg via INTRAVENOUS
  Filled 2024-06-12: qty 4

## 2024-06-12 MED ORDER — IPRATROPIUM-ALBUTEROL 0.5-2.5 (3) MG/3ML IN SOLN
3.0000 mL | Freq: Four times a day (QID) | RESPIRATORY_TRACT | Status: DC
  Administered 2024-06-13 (×2): 3 mL via RESPIRATORY_TRACT
  Filled 2024-06-12 (×3): qty 3

## 2024-06-12 MED ORDER — BUSPIRONE HCL 5 MG PO TABS
15.0000 mg | ORAL_TABLET | Freq: Every day | ORAL | Status: DC
  Administered 2024-06-13: 15 mg via ORAL
  Filled 2024-06-12: qty 1

## 2024-06-12 MED ORDER — PROCHLORPERAZINE EDISYLATE 10 MG/2ML IJ SOLN: 5.0000 mg | Freq: Four times a day (QID) | INTRAMUSCULAR | Status: DC | PRN

## 2024-06-12 MED ORDER — BUSPIRONE HCL 10 MG PO TABS
30.0000 mg | ORAL_TABLET | Freq: Every morning | ORAL | Status: DC
  Administered 2024-06-13: 30 mg via ORAL
  Filled 2024-06-12: qty 3

## 2024-06-12 MED ORDER — CLONAZEPAM 0.5 MG PO TABS
0.5000 mg | ORAL_TABLET | Freq: Two times a day (BID) | ORAL | Status: DC | PRN
  Administered 2024-06-12: 0.5 mg via ORAL
  Filled 2024-06-12: qty 1

## 2024-06-12 MED ORDER — MELATONIN 5 MG PO TABS
5.0000 mg | ORAL_TABLET | Freq: Every evening | ORAL | Status: DC | PRN
  Administered 2024-06-12: 5 mg via ORAL
  Filled 2024-06-12: qty 1

## 2024-06-12 MED ORDER — MONTELUKAST SODIUM 10 MG PO TABS
10.0000 mg | ORAL_TABLET | Freq: Every day | ORAL | Status: DC
  Administered 2024-06-12: 10 mg via ORAL
  Filled 2024-06-12: qty 1

## 2024-06-12 MED ORDER — POLYETHYLENE GLYCOL 3350 17 G PO PACK: 17.0000 g | PACK | Freq: Every day | ORAL | Status: DC | PRN

## 2024-06-12 MED ORDER — ACETAMINOPHEN 500 MG PO TABS: 500.0000 mg | ORAL_TABLET | Freq: Four times a day (QID) | ORAL | Status: DC | PRN

## 2024-06-12 MED ORDER — ATORVASTATIN CALCIUM 80 MG PO TABS
80.0000 mg | ORAL_TABLET | Freq: Every day | ORAL | Status: DC
  Administered 2024-06-13: 80 mg via ORAL
  Filled 2024-06-12: qty 1

## 2024-06-12 MED ORDER — BUDESON-GLYCOPYRROL-FORMOTEROL 160-9-4.8 MCG/ACT IN AERO
2.0000 | INHALATION_SPRAY | Freq: Two times a day (BID) | RESPIRATORY_TRACT | Status: DC
  Administered 2024-06-13: 2 via RESPIRATORY_TRACT
  Filled 2024-06-12: qty 5.9

## 2024-06-12 MED ORDER — INSULIN ASPART 100 UNIT/ML IJ SOLN
0.0000 [IU] | Freq: Three times a day (TID) | INTRAMUSCULAR | Status: DC
  Administered 2024-06-13: 1 [IU] via SUBCUTANEOUS
  Administered 2024-06-13: 2 [IU] via SUBCUTANEOUS
  Filled 2024-06-12: qty 1
  Filled 2024-06-12: qty 2

## 2024-06-12 MED ORDER — LEVOTHYROXINE SODIUM 112 MCG PO TABS
112.0000 ug | ORAL_TABLET | Freq: Every day | ORAL | Status: DC
  Administered 2024-06-13: 112 ug via ORAL
  Filled 2024-06-12: qty 1

## 2024-06-12 MED ORDER — IPRATROPIUM-ALBUTEROL 0.5-2.5 (3) MG/3ML IN SOLN
3.0000 mL | Freq: Once | RESPIRATORY_TRACT | Status: AC
  Administered 2024-06-12: 3 mL via RESPIRATORY_TRACT
  Filled 2024-06-12: qty 3

## 2024-06-12 MED ORDER — PANTOPRAZOLE SODIUM 40 MG PO TBEC
40.0000 mg | DELAYED_RELEASE_TABLET | Freq: Every day | ORAL | Status: DC
  Administered 2024-06-13: 40 mg via ORAL
  Filled 2024-06-12 (×2): qty 1

## 2024-06-12 MED ORDER — ESCITALOPRAM OXALATE 10 MG PO TABS
20.0000 mg | ORAL_TABLET | Freq: Every day | ORAL | Status: DC
  Administered 2024-06-13: 20 mg via ORAL
  Filled 2024-06-12: qty 2

## 2024-06-12 MED ORDER — INSULIN ASPART 100 UNIT/ML IJ SOLN
0.0000 [IU] | Freq: Every day | INTRAMUSCULAR | Status: DC
  Administered 2024-06-12: 4 [IU] via SUBCUTANEOUS
  Filled 2024-06-12: qty 4

## 2024-06-12 NOTE — ED Notes (Signed)
 Pt taken off bipap and placed on 3.5 L [place]. MD and RT aware.
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
## 2024-06-12 NOTE — Plan of Care (Signed)

## 2024-06-12 NOTE — Progress Notes (Signed)
   06/12/24 1515  BiPAP/CPAP/SIPAP  BiPAP/CPAP/SIPAP Pt Type Adult  BiPAP/CPAP/SIPAP SERVO (AIR)  Mask Type Full face mask  Dentures removed? Not applicable  Mask Size Medium  Set Rate 10 breaths/min  Respiratory Rate 24 breaths/min  IPAP 12 cmH20  EPAP 6 cmH2O  PEEP 6 cmH20  FiO2 (%) 40 %  Minute Ventilation 10.2  Leak 14  Peak Inspiratory Pressure (PIP) 16  Tidal Volume (Vt) 628    Pt was placed on BIPAP per MD order. Pt had slightly increased WOB and RR in low 30s. Pt is tolerating BIPAP well at this time. RT will monitor.
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
## 2024-06-12 NOTE — ED Provider Notes (Signed)
 [facility] AT [facility] Provider Note   CSN: [id number] Arrival date & time: 06/12/24  1501     Patient presents with: Respiratory Distress   [name] [name] is a 68 y.o. female w/ hx of CAD, multiple cardiac stents, COPD, HTN, HLD, presenting to ED with SOB.  Pt reports accidentally inhaled smoke from fireplace earlier today and became acutely SOB.  EMS gave duoneb x 2, 125 mg solumedrol IV and 2 gm IV magnesium .  Pt still feels SOB.  [name] [name] wears 3 L of oxygen  at home.  Sees cardiology, records reviewed last visit 01/06/24, noting hx of HFpEF on 40 mg daily lasix .    HPI     Prior to Admission medications   Medication Sig Start Date End Date Taking? Authorizing Provider  albuterol  (VENTOLIN  HFA) 108 (90 Base) MCG/ACT inhaler INHALE 2 PUFFS into THE lungs EVERY 6 HOURS AS NEEDED wheezing AND SHORTNESS OF BREATH 10/15/23   Vicci Barnie [name], MD  alendronate  (FOSAMAX ) 70 MG tablet Take 1 tablet (70 mg total) by mouth every 7 (seven) days. Take with a full glass of water on an empty stomach. 04/06/24   Vicci Barnie [name], MD  aspirin  EC 81 MG tablet Take 81 mg by mouth in the morning.    [provider]  atorvastatin  (LIPITOR ) 80 MG tablet Take 1 tablet (80 mg total) by mouth daily. 09/22/23   Daneen Damien BROCKS, NP  azithromycin  (ZITHROMAX ) 250 MG tablet Take 1 tablet (250 mg total) by mouth daily. 04/13/24   Kara Dorn [name], MD  busPIRone  (BUSPAR ) 15 MG tablet Take 2 tablets (30 mg total) by mouth in the morning AND 1 tablet (15 mg total) daily at 12 noon AND 1 tablet (15 mg total) every evening. 05/18/24   Nwoko, Uchenna E, PA  cephALEXin  (KEFLEX ) 500 MG capsule Take 1 capsule (500 mg total) by mouth 2 (two) times daily. 05/21/24   Vivienne Delon HERO, PA-C  clonazePAM  (KLONOPIN ) 0.5 MG tablet Take 1 tablet (0.5 mg total) by mouth 2 (two) times daily as needed. 06/03/24   Nwoko, Uchenna E, PA  doxepin  (SINEQUAN ) 75 MG capsule Take 1 capsule (75 mg  total) by mouth at bedtime. 05/18/24   Nwoko, Uchenna E, PA  Dupilumab  (DUPIXENT ) 300 MG/2ML SOAJ Inject 300 mg into the skin every 14 (fourteen) days. 05/26/24   Kara Dorn [name], MD  escitalopram  (LEXAPRO ) 20 MG tablet Take 1 tablet (20 mg total) by mouth daily. 05/18/24   Nwoko, Uchenna E, PA  ezetimibe  (ZETIA ) 10 MG tablet Take 1 tablet (10 mg total) by mouth daily. 07/04/23   Meng, Hao, PA  Fluticasone -Umeclidin-Vilant (TRELEGY ELLIPTA ) 200-62.5-25 MCG/ACT AEPB Inhale 1 puff into the lungs daily. 08/07/23   Kara Dorn [name], MD  furosemide  (LASIX ) 40 MG tablet Take 1 tablet (40 mg total) by mouth daily. 06/03/24   Vicci Barnie [name], MD  gabapentin  (NEURONTIN ) 600 MG tablet Take 1 tablet (600 mg total) by mouth 2 (two) times daily. 12/04/23   Vicci Barnie [name], MD  hydrOXYzine  (ATARAX ) 25 MG tablet Take 1 tablet (25 mg total) by mouth at bedtime as needed. 05/18/24   Nwoko, Uchenna E, PA  ipratropium-albuterol  (DUONEB) 0.5-2.5 (3) MG/3ML SOLN Take 3 mLs by nebulization every 8 (eight) hours. Patient taking differently: Take 3 mLs by nebulization every 8 (eight) hours. 01/15/24   Kara Dorn [name], MD  levothyroxine  (SYNTHROID ) 112 MCG tablet Take 1 tablet (112 mcg total) by mouth daily. 01/22/24  Vicci Barnie NOVAK, MD  metFORMIN  (GLUCOPHAGE ) 500 MG tablet Take 1 tablet (500 mg total) by mouth 2 (two) times daily with a meal. 03/12/24   Vicci Barnie NOVAK, MD  mirtazapine  (REMERON ) 45 MG tablet Take 1 tablet (45 mg total) by mouth at bedtime. 05/18/24   Nwoko, Uchenna E, PA  montelukast  (SINGULAIR ) 10 MG tablet Take 1 tablet (10 mg total) by mouth at bedtime. 11/11/23   Vicci Barnie NOVAK, MD  nebivolol  (BYSTOLIC ) 10 MG tablet Take 1 tablet (10 mg total) by mouth daily. 09/22/23   Daneen Damien BROCKS, NP  nitroGLYCERIN  (NITROSTAT ) 0.4 MG SL tablet Place 1 tablet (0.4 mg total) under the tongue every 5 (five) minutes as needed for chest pain. 04/15/24 07/14/24  Daneen Damien BROCKS, NP  OHTUVAYRE  3 MG/2.5ML SUSP   05/19/24   [provider]  omeprazole  (PRILOSEC) 40 MG capsule Take 1 capsule (40 mg total) by mouth 2 (two) times daily before a meal. NEED APPOINTMENT FOR ADDITIONAL REFILLS 04/21/24   Legrand Victory LITTIE DOUGLAS, MD  OXYGEN  Inhale 2-3 L/min into the lungs continuous.    [provider]  ticagrelor  (BRILINTA ) 60 MG TABS tablet Take 1 tablet (60 mg total) by mouth 2 (two) times daily. 12/11/23   Burnard Debby LABOR, MD  varenicline  (CHANTIX ) 1 MG tablet Take 1 tablet (1 mg total) by mouth 2 (two) times daily. Patient not taking: Reported on 05/13/2024 04/21/24   Kara Dorn NOVAK, MD    Allergies: Avocado, Latex, and Codeine     Review of Systems  Updated Vital Signs BP 124/60   Pulse 78   Temp 97.8 F (36.6 C) (Oral)   Resp 14   Ht 5' 1 (1.549 m)   Wt 75.8 kg   SpO2 98%   BMI 31.55 kg/m   Physical Exam Constitutional:      General: [name] [name] is not in acute distress. HENT:     Head: Normocephalic and atraumatic.  Eyes:     Conjunctiva/sclera: Conjunctivae normal.     Pupils: Pupils are equal, round, and reactive to light.  Cardiovascular:     Rate and Rhythm: Regular rhythm. Tachycardia present.  Pulmonary:     Effort: Pulmonary effort is normal. No respiratory distress.     Comments: Rhonchrous breath sounds, 98% on NRB mask, speaking in short sentences Abdominal:     General: There is no distension.     Tenderness: There is no abdominal tenderness.  Musculoskeletal:     Right lower leg: Edema present.     Left lower leg: Edema present.  Skin:    General: Skin is warm and dry.  Neurological:     General: No focal deficit present.     Mental Status: [name] [name] is alert. Mental status is at baseline.  Psychiatric:        Mood and Affect: Mood normal.        Behavior: Behavior normal.     (all labs ordered are listed, but only abnormal results are displayed) Labs Reviewed  BASIC METABOLIC PANEL WITH GFR - Abnormal; Notable for the following components:      Result Value    Glucose, Bld 173 (*)    BUN 7 (*)    Calcium  8.5 (*)    All other components within normal limits  CBC WITH DIFFERENTIAL/PLATELET - Abnormal; Notable for the following components:   WBC 13.4 (*)    Hemoglobin 11.1 (*)    RDW 16.5 (*)    Platelets 402 (*)  Lymphs Abs 4.4 (*)    Monocytes Absolute 1.3 (*)    All other components within normal limits  I-STAT VENOUS BLOOD GAS, ED - Abnormal; Notable for the following components:   pO2, Ven 55 (*)    Bicarbonate 28.9 (*)    Acid-Base Excess 3.0 (*)    Calcium , Ion 1.06 (*)    HCT 35.0 (*)    Hemoglobin 11.9 (*)    All other components within normal limits  BRAIN NATRIURETIC PEPTIDE  TROPONIN I (HIGH SENSITIVITY)    EKG: EKG Interpretation Date/Time:  Saturday June 12 2024 15:10:16 EST Ventricular Rate:  83 PR Interval:  153 QRS Duration:  133 QT Interval:  419 QTC Calculation: 493 R Axis:   89  Text Interpretation: Sinus rhythm  LBBB, new from prior Confirmed by Cottie Cough [phone]) on 06/12/2024 3:12:48 PM  Radiology: [facility] Chest Portable 1 View Result Date: 06/12/2024 CLINICAL DATA:  Short of breath. EXAM: PORTABLE CHEST 1 VIEW COMPARISON:  01/12/2024. FINDINGS: Cardiac silhouette is normal in size. No mediastinal or hilar masses. Stable prominent bronchovascular markings. Lungs otherwise clear. No pleural effusion or pneumothorax. Skeletal structures are grossly intact. IMPRESSION: No active disease. Electronically Signed   By: Alm Parkins M.D.   On: 06/12/2024 16:24     .Critical Care  Performed by: Cottie Cough PARAS, MD Authorized by: Cottie Cough PARAS, MD   Critical care provider statement:    Critical care time (minutes):  40   Critical care time was exclusive of:  Separately billable procedures and treating other patients   Critical care was necessary to treat or prevent imminent or life-threatening deterioration of the following conditions:  Respiratory failure   Critical care was time spent  personally by me on the following activities:  Ordering and performing treatments and interventions, ordering and review of laboratory studies, ordering and review of radiographic studies, pulse oximetry, review of old charts, examination of patient and evaluation of patient's response to treatment    Medications Ordered in the ED  ipratropium-albuterol  (DUONEB) 0.5-2.5 (3) MG/3ML nebulizer solution 3 mL (has no administration in time range)  furosemide  (LASIX ) injection 40 mg (40 mg Intravenous Given 06/12/24 1612)    Clinical Course as of 06/12/24 1939  Sat Jun 12, 2024  1605 Pt feeling much better on bipap, breathing more comfortably [MT]  1718 Will trial off bipap [MT]  1937 Patient assessed and has been comfortable off the BiPAP and continues to have some expiratory wheezing.  [name] [name] is back on her baseline 3 L that [name] [name] wears at home for her COPD.  Given her significant underlying pulmonary disease and advanced age, risk factors are worse in exacerbation, we discussed an observation admission to the hospital and [name] [name] is in agreement [MT]    Clinical Course User Index [MT] Haru Shaff, Cough PARAS, MD                                 Medical Decision Making Amount and/or Complexity of Data Reviewed Labs: ordered. Radiology: ordered. ECG/medicine tests: ordered.  Risk Prescription drug management. Decision regarding hospitalization.   This patient presents to the ED with concern for SOB. This involves an extensive number of treatment options, and is a complaint that carries with it a high risk of complications and morbidity.  The differential diagnosis includes COPD exacerbation vs CHF exacerbation vs flash pulm edema vs PNA vs other  Co-morbidities that complicate the patient evaluation:  hx of COPD, CHF  Additional history obtained from EMS  External records from outside source obtained and reviewed including cardiology outpatient office records  I ordered and personally interpreted  labs.  The pertinent results include: No emergent findings.  Patient has a minor leukocytosis in the setting of recent steroids  I ordered imaging studies including dg chest I independently visualized and interpreted imaging which showed no emergent findings I agree with the radiologist interpretation  The patient was maintained on a cardiac monitor.  I personally viewed and interpreted the cardiac monitored which showed an underlying rhythm of: Sinus rhythm and sinus tachycardia  Per my interpretation the patient's ECG shows no acute ischemic findings  I ordered medication including nebulizer for shortness of breath, COPD, IV Lasix  for suspected heart failure on arrival, BiPAP initially for increased respiratory effort and shortness of breath  I have reviewed the patients home medicines and have made adjustments as needed  Test Considered: Doubt acute PE, sepsis, pneumonia   After the interventions noted above, I reevaluated the patient and found that they have: improved  Given this overall clinical presentation I suspect that this was a COPD exacerbation primarily, triggered by smoking elation.  I have a lower suspicion for ongoing congestive heart failure with no significant pulmonary edema on her x-ray and a normal BNP.  Patient did receive some diuresis earlier today and will continue with Q46 DuoNeb treatments in the hospital.  [name] [name] has already received IV Solu-Medrol  and IV magnesium  by EMS.  I do not believe [name] [name] requires intubation or ICU monitoring overnight.  [name] [name] appears to be stable now for hospital admission  Dispostion:  After consideration of the diagnostic results and the patients response to treatment, I feel that the patent would benefit from medical admission.      Final diagnoses:  COPD exacerbation [facility])    ED Discharge Orders     None          Latorria Zeoli, Donnice PARAS, MD 06/12/24 [phone]

## 2024-06-13 ENCOUNTER — Observation Stay (HOSPITAL_COMMUNITY)

## 2024-06-13 ENCOUNTER — Other Ambulatory Visit (HOSPITAL_COMMUNITY): Payer: Self-pay

## 2024-06-13 ENCOUNTER — Encounter (HOSPITAL_COMMUNITY): Payer: Self-pay | Admitting: Internal Medicine

## 2024-06-13 DIAGNOSIS — R9431 Abnormal electrocardiogram [ECG] [EKG]: Secondary | ICD-10-CM

## 2024-06-13 DIAGNOSIS — J441 Chronic obstructive pulmonary disease with (acute) exacerbation: Secondary | ICD-10-CM | POA: Diagnosis not present

## 2024-06-13 LAB — ECHOCARDIOGRAM COMPLETE
Area-P 1/2: 4.31 cm2
Height: 61 in
S' Lateral: 3.1 cm
Weight: 2656.1 [oz_av]

## 2024-06-13 LAB — BASIC METABOLIC PANEL WITH GFR
Anion gap: 15 (ref 5–15)
BUN: 11 mg/dL (ref 8–23)
CO2: 25 mmol/L (ref 22–32)
Calcium: 8.9 mg/dL (ref 8.9–10.3)
Chloride: 102 mmol/L (ref 98–111)
Creatinine, Ser: 0.83 mg/dL (ref 0.44–1.00)
GFR, Estimated: >60 mL/min (ref >60)
Glucose, Bld: 137 mg/dL — ABNORMAL HIGH (ref 70–99)
Potassium: 4.3 mmol/L (ref 3.5–5.1)
Sodium: 142 mmol/L (ref 135–145)

## 2024-06-13 LAB — CBC
HCT: 38.7 % (ref 36.0–46.0)
Hemoglobin: 11.8 g/dL — ABNORMAL LOW (ref 12.0–15.0)
MCH: 28.4 pg (ref 26.0–34.0)
MCHC: 30.5 g/dL (ref 30.0–36.0)
MCV: 93.3 fL (ref 80.0–100.0)
Platelets: 418 K/uL — ABNORMAL HIGH (ref 150–400)
RBC: 4.15 MIL/uL (ref 3.87–5.11)
RDW: 16.5 % — ABNORMAL HIGH (ref 11.5–15.5)
WBC: 13.5 K/uL — ABNORMAL HIGH (ref 4.0–10.5)
nRBC: 0 % (ref 0.0–0.2)

## 2024-06-13 LAB — GLUCOSE, CAPILLARY
Glucose-Capillary: 149 mg/dL — ABNORMAL HIGH (ref 70–99)
Glucose-Capillary: 184 mg/dL — ABNORMAL HIGH (ref 70–99)

## 2024-06-13 LAB — MAGNESIUM: Magnesium: 2.3 mg/dL (ref 1.7–2.4)

## 2024-06-13 LAB — PHOSPHORUS: Phosphorus: 3.5 mg/dL (ref 2.5–4.6)

## 2024-06-13 MED ORDER — DOXYCYCLINE HYCLATE 100 MG PO TABS
100.0000 mg | ORAL_TABLET | Freq: Two times a day (BID) | ORAL | 0 refills | Status: AC
  Filled 2024-06-13: qty 6, 3d supply, fill #0

## 2024-06-13 MED ORDER — PREDNISONE 10 MG PO TABS
ORAL_TABLET | ORAL | 0 refills | Status: DC
  Filled 2024-06-13: qty 19, 7d supply, fill #0

## 2024-06-13 MED ORDER — ACETAMINOPHEN 500 MG PO TABS: 500.0000 mg | ORAL_TABLET | Freq: Four times a day (QID) | ORAL | Status: AC | PRN

## 2024-06-13 NOTE — Plan of Care (Signed)
 Problem: Education: Goal: Ability to describe self-care measures that may prevent or decrease complications (Diabetes Survival Skills Education) will improve 06/13/2024 1403 by Joshua Zorita CROME, RN Outcome: Adequate for Discharge 06/13/2024 1403 by Joshua Zorita CROME, RN Outcome: Adequate for Discharge Goal: Individualized Educational Video(s) 06/13/2024 1403 by Joshua Zorita CROME, RN Outcome: Adequate for Discharge 06/13/2024 1403 by Joshua Zorita CROME, RN Outcome: Adequate for Discharge   Problem: Coping: Goal: Ability to adjust to condition or change in health will improve 06/13/2024 1403 by Joshua Zorita CROME, RN Outcome: Adequate for Discharge 06/13/2024 1403 by Joshua Zorita CROME, RN Outcome: Adequate for Discharge   Problem: Fluid Volume: Goal: Ability to maintain a balanced intake and output will improve 06/13/2024 1403 by Joshua Zorita CROME, RN Outcome: Adequate for Discharge 06/13/2024 1403 by Joshua Zorita CROME, RN Outcome: Adequate for Discharge   Problem: Health Behavior/Discharge Planning: Goal: Ability to identify and utilize available resources and services will improve 06/13/2024 1403 by Joshua Zorita CROME, RN Outcome: Adequate for Discharge 06/13/2024 1403 by Joshua Zorita CROME, RN Outcome: Adequate for Discharge Goal: Ability to manage health-related needs will improve 06/13/2024 1403 by Joshua Zorita CROME, RN Outcome: Adequate for Discharge 06/13/2024 1403 by Joshua Zorita CROME, RN Outcome: Adequate for Discharge   Problem: Metabolic: Goal: Ability to maintain appropriate glucose levels will improve 06/13/2024 1403 by Joshua Zorita CROME, RN Outcome: Adequate for Discharge 06/13/2024 1403 by Joshua Zorita CROME, RN Outcome: Adequate for Discharge   Problem: Nutritional: Goal: Maintenance of adequate nutrition will improve 06/13/2024 1403 by Joshua Zorita CROME, RN Outcome: Adequate for Discharge 06/13/2024 1403 by Joshua Zorita CROME, RN Outcome: Adequate for Discharge Goal: Progress toward achieving an  optimal weight will improve 06/13/2024 1403 by Joshua Zorita CROME, RN Outcome: Adequate for Discharge 06/13/2024 1403 by Joshua Zorita CROME, RN Outcome: Adequate for Discharge   Problem: Skin Integrity: Goal: Risk for impaired skin integrity will decrease 06/13/2024 1403 by Joshua Zorita CROME, RN Outcome: Adequate for Discharge 06/13/2024 1403 by Joshua Zorita CROME, RN Outcome: Adequate for Discharge   Problem: Tissue Perfusion: Goal: Adequacy of tissue perfusion will improve 06/13/2024 1403 by Joshua Zorita CROME, RN Outcome: Adequate for Discharge 06/13/2024 1403 by Joshua Zorita CROME, RN Outcome: Adequate for Discharge   Problem: Education: Goal: Knowledge of General Education information will improve Description: Including pain rating scale, medication(s)/side effects and non-pharmacologic comfort measures 06/13/2024 1403 by Joshua Zorita CROME, RN Outcome: Adequate for Discharge 06/13/2024 1403 by Joshua Zorita CROME, RN Outcome: Adequate for Discharge   Problem: Health Behavior/Discharge Planning: Goal: Ability to manage health-related needs will improve 06/13/2024 1403 by Joshua Zorita CROME, RN Outcome: Adequate for Discharge 06/13/2024 1403 by Joshua Zorita CROME, RN Outcome: Adequate for Discharge   Problem: Clinical Measurements: Goal: Ability to maintain clinical measurements within normal limits will improve 06/13/2024 1403 by Joshua Zorita CROME, RN Outcome: Adequate for Discharge 06/13/2024 1403 by Joshua Zorita CROME, RN Outcome: Adequate for Discharge Goal: Will remain free from infection 06/13/2024 1403 by Joshua Zorita CROME, RN Outcome: Adequate for Discharge 06/13/2024 1403 by Joshua Zorita CROME, RN Outcome: Adequate for Discharge Goal: Diagnostic test results will improve 06/13/2024 1403 by Joshua Zorita CROME, RN Outcome: Adequate for Discharge 06/13/2024 1403 by Joshua Zorita CROME, RN Outcome: Adequate for Discharge Goal: Respiratory complications will improve 06/13/2024 1403 by Joshua Zorita CROME, RN Outcome:  Adequate for Discharge 06/13/2024 1403 by Joshua Zorita CROME, RN Outcome: Adequate for Discharge Goal: Cardiovascular complication will be avoided 06/13/2024 1403 by Joshua Zorita CROME, RN Outcome:  Adequate for Discharge 06/13/2024 1403 by Joshua Zorita CROME, RN Outcome: Adequate for Discharge   Problem: Activity: Goal: Risk for activity intolerance will decrease 06/13/2024 1403 by Joshua Zorita CROME, RN Outcome: Adequate for Discharge 06/13/2024 1403 by Joshua Zorita CROME, RN Outcome: Adequate for Discharge   Problem: Nutrition: Goal: Adequate nutrition will be maintained 06/13/2024 1403 by Joshua Zorita CROME, RN Outcome: Adequate for Discharge 06/13/2024 1403 by Joshua Zorita CROME, RN Outcome: Adequate for Discharge   Problem: Coping: Goal: Level of anxiety will decrease 06/13/2024 1403 by Joshua Zorita CROME, RN Outcome: Adequate for Discharge 06/13/2024 1403 by Joshua Zorita CROME, RN Outcome: Adequate for Discharge   Problem: Elimination: Goal: Will not experience complications related to bowel motility 06/13/2024 1403 by Joshua Zorita CROME, RN Outcome: Adequate for Discharge 06/13/2024 1403 by Joshua Zorita CROME, RN Outcome: Adequate for Discharge Goal: Will not experience complications related to urinary retention 06/13/2024 1403 by Joshua Zorita CROME, RN Outcome: Adequate for Discharge 06/13/2024 1403 by Joshua Zorita CROME, RN Outcome: Adequate for Discharge   Problem: Pain Managment: Goal: General experience of comfort will improve and/or be controlled 06/13/2024 1403 by Joshua Zorita CROME, RN Outcome: Adequate for Discharge 06/13/2024 1403 by Joshua Zorita CROME, RN Outcome: Adequate for Discharge   Problem: Safety: Goal: Ability to remain free from injury will improve 06/13/2024 1403 by Joshua Zorita CROME, RN Outcome: Adequate for Discharge 06/13/2024 1403 by Joshua Zorita CROME, RN Outcome: Adequate for Discharge   Problem: Skin Integrity: Goal: Risk for impaired skin integrity will decrease 06/13/2024 1403 by Joshua Zorita CROME, RN Outcome: Adequate for Discharge 06/13/2024 1403 by Joshua Zorita CROME, RN Outcome: Adequate for Discharge

## 2024-06-13 NOTE — Plan of Care (Signed)
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
## 2024-06-13 NOTE — Progress Notes (Signed)
  Echocardiogram 2D Echocardiogram has been performed.  [name] [name] 06/13/2024, 11:42 AM

## 2024-06-13 NOTE — Care Management Obs Status (Signed)
 MEDICARE OBSERVATION STATUS NOTIFICATION   Patient Details  Name: [name] [name] MRN: [id number] Date of Birth: [birth year]   Medicare Observation Status Notification Given:  Yes    Rily Nickey G., RN 06/13/2024, 11:16 AM

## 2024-06-14 ENCOUNTER — Ambulatory Visit: Payer: Self-pay | Admitting: Internal Medicine

## 2024-06-14 ENCOUNTER — Telehealth: Payer: Self-pay

## 2024-06-14 ENCOUNTER — Other Ambulatory Visit (HOSPITAL_COMMUNITY): Payer: Self-pay

## 2024-06-14 LAB — HEMOGLOBIN A1C
Hgb A1c MFr Bld: 6.5 % — ABNORMAL HIGH (ref 4.8–5.6)
Mean Plasma Glucose: 140 mg/dL

## 2024-06-14 NOTE — Transitions of Care (Post Inpatient/ED Visit) (Signed)
   06/14/2024  Name: [name] [name] MRN: [id number] DOB: 1955-10-23  Today's TOC FU Call Status: Today's TOC FU Call Status:: Unsuccessful Call (1st Attempt) Unsuccessful Call (1st Attempt) Date: 06/14/24  Attempted to reach the patient regarding the most recent Inpatient/ED visit.  Follow Up Plan: Additional outreach attempts will be made to reach the patient to complete the Transitions of Care (Post Inpatient/ED visit) call.   Signature  Slater Diesel, RN

## 2024-06-14 NOTE — Telephone Encounter (Signed)
 Copied from CRM #[id number]. Topic: General - Other >> Jun 14, 2024  5:22 PM Hadassah PARAS wrote:  Reason for CRM: Pt calling returning a missed call from Marvis Bradley, RN. Please advise #[phone]

## 2024-06-15 ENCOUNTER — Telehealth: Payer: Self-pay

## 2024-06-15 NOTE — Telephone Encounter (Signed)
 Call returned to patient, I spoke with her daughter, [name], and that encounter is documented in another [facility] call today

## 2024-06-15 NOTE — Telephone Encounter (Signed)
 Dr Vicci- RICK.  She has an appointment with you 06/21/2024.    Tiffany explained that her mother has been using the O2 @ 4L continuously and her O2 sat is around 95%. She said when she walks she needs to increase to 5L. She went on to say that before this hospitalization she was able to maintain O2 sat @ 96% on 3 L.   She said that overall her mother is doing better than she was when she was in the hospital .  They also found out that there was a problem with her O2 concentrator and [employer] came out to replace it.

## 2024-06-15 NOTE — Transitions of Care (Post Inpatient/ED Visit) (Signed)
   06/15/2024  Name: [name] [name] MRN: [id number] DOB: 07-23-1955  Today's TOC FU Call Status: Today's TOC FU Call Status:: Successful TOC FU Call Completed Unsuccessful Call (1st Attempt) Date: 06/14/24 [facility] FU Call Complete Date: 06/15/24  Patient's Name and Date of Birth confirmed. DOB, Name  Transition Care Management Follow-up Telephone Call Date of Discharge: 06/13/24 Discharge Facility: [facility]) Type of Discharge: Inpatient Admission Primary Inpatient Discharge Diagnosis:: acute exacerabation of COPD How have you been since you were released from the hospital?: Better (Call completed with patient's daughter, [name].  She said there was a problem with her mother's O2 concentrator and [employer] came out to replace the concentrator. She said her mother still struggles with her breathing but overall is better than it was.) Any questions or concerns?: Yes Patient Questions/Concerns:: Tiffany explained that her mother has been wearing the O2 @ 4L continuously and her O2 sat is around 95%.  She said when she walks she needs to increase to 5L.  She went on to say that before this hospitalization she was able to maintain O2 sat @ 96% on 3 L. Patient Questions/Concerns Addressed: Notified Provider of Patient Questions/Concerns  Items Reviewed: Did you receive and understand the discharge instructions provided?: Yes Medications obtained,verified, and reconciled?: No Medications Not Reviewed Reasons:: Other: (Tiffany said that her mother has all of her medications and she manages the meds for her mother. She stated she did not have any questions about the meds and did not have to review the med list. She also confirmed that her mother jas a nebulizer) Any new allergies since your discharge?: No Dietary orders reviewed?: Yes Type of Diet Ordered:: heart healthy, low sodium Do you have support at home?: Yes People in Home [RPT]: alone Name of Support/Comfort Primary Source: her daughter  [name] checks on her regularly.  Medications Reviewed Today: Medications Reviewed Today   Medications were not reviewed in this encounter     Home Care and Equipment/Supplies: Were Home Health Services Ordered?: No Any new equipment or medical supplies ordered?: No  Functional Questionnaire: Do you need assistance with bathing/showering or dressing?: No (Tiffany said she is able to dress herself but it wears her out.) Do you need assistance with meal preparation?: No Do you need assistance with eating?: No Do you have difficulty maintaining continence: No Do you need assistance with getting out of bed/getting out of a chair/moving?: Yes (She uses O2 @ 4L continuously but needs to increase to 5L when ambulating.) Do you have difficulty managing or taking your medications?: Yes (Tiffany sets up her meds for her.)  Follow up appointments reviewed: PCP Follow-up appointment confirmed?: Yes Date of PCP follow-up appointment?: 06/21/24 Follow-up Provider: Dr [facility] Follow-up appointment confirmed?: Yes Date of Specialist follow-up appointment?: 06/22/24 Follow-Up Specialty Provider:: pulmonary.   07/13/2024- cardiology Do you need transportation to your follow-up appointment?: No (Tiffany said her mother has been re-certified for [facility] [place].) Do you understand care options if your condition(s) worsen?: Yes-patient verbalized understanding    SIGNATURE Slater Diesel, RN

## 2024-06-16 NOTE — Progress Notes (Signed)
 Patient was educated in detail prior to first dose of Dupixent  observed and administered in clinic on 06/02/24. See OV note 06/02/24 for further details.  Patient self-administered Dupixent  300mg /61ml x 2 (total dose 600mg ) in right upper thigh and left upper thigh using [facility]-supplied medication   Dupixent  300mg /1mL autoinjector pen NDC: [phone] Lot: [id number] Expiration: 2026-08-14   Patient monitored for 30 minutes for adverse reaction.  Patient tolerated well.  Patient denies itchiness and irritation at injection.   PLAN Continue Dupixent  300mg  every 14 days.  Next dose is due 06/16/2024 and every 14 days thereafter. Rx sent to: [facility]: [phone] .  Patient provided with pharmacy phone number and advised to call later this week to schedule shipment to home. Patient provided with copay card information to provide to pharmacy if quoted copay exceeds $5 per month. Continue remaining respiratory regimen as prescribed by Dr. Kara. Do NOT stop taking your other medications.  Provided [facility] [employer] contact information as they will ship nicotine  replacement therapies to patient. She previously found nicotine  patch ineffective, but was using 1 ppd at the time she last tried patches. Consider retrialing patch.  Aleck Puls, PharmD, BCPS, CPP Clinical Pharmacist  [facility]  [facility]

## 2024-06-17 ENCOUNTER — Other Ambulatory Visit (HOSPITAL_COMMUNITY): Payer: Self-pay | Admitting: Emergency Medicine

## 2024-06-17 ENCOUNTER — Other Ambulatory Visit (HOSPITAL_COMMUNITY): Payer: Self-pay

## 2024-06-17 ENCOUNTER — Emergency Department (HOSPITAL_COMMUNITY)

## 2024-06-17 ENCOUNTER — Observation Stay (HOSPITAL_COMMUNITY)
Admission: EM | Admit: 2024-06-17 | Discharge: 2024-06-22 | DRG: 190 | Disposition: A | Attending: Internal Medicine | Admitting: Internal Medicine

## 2024-06-17 ENCOUNTER — Encounter (HOSPITAL_COMMUNITY): Payer: Self-pay

## 2024-06-17 ENCOUNTER — Other Ambulatory Visit: Payer: Self-pay

## 2024-06-17 DIAGNOSIS — I2510 Atherosclerotic heart disease of native coronary artery without angina pectoris: Secondary | ICD-10-CM

## 2024-06-17 DIAGNOSIS — A419 Sepsis, unspecified organism: Secondary | ICD-10-CM | POA: Diagnosis present

## 2024-06-17 DIAGNOSIS — J441 Chronic obstructive pulmonary disease with (acute) exacerbation: Secondary | ICD-10-CM | POA: Diagnosis not present

## 2024-06-17 DIAGNOSIS — J9621 Acute and chronic respiratory failure with hypoxia: Secondary | ICD-10-CM | POA: Diagnosis present

## 2024-06-17 DIAGNOSIS — J449 Chronic obstructive pulmonary disease, unspecified: Secondary | ICD-10-CM

## 2024-06-17 DIAGNOSIS — I5030 Unspecified diastolic (congestive) heart failure: Secondary | ICD-10-CM | POA: Insufficient documentation

## 2024-06-17 DIAGNOSIS — R069 Unspecified abnormalities of breathing: Secondary | ICD-10-CM | POA: Diagnosis not present

## 2024-06-17 DIAGNOSIS — J181 Lobar pneumonia, unspecified organism: Secondary | ICD-10-CM | POA: Diagnosis not present

## 2024-06-17 DIAGNOSIS — Z8659 Personal history of other mental and behavioral disorders: Secondary | ICD-10-CM

## 2024-06-17 DIAGNOSIS — J189 Pneumonia, unspecified organism: Secondary | ICD-10-CM | POA: Diagnosis present

## 2024-06-17 DIAGNOSIS — F17210 Nicotine dependence, cigarettes, uncomplicated: Secondary | ICD-10-CM | POA: Diagnosis present

## 2024-06-17 DIAGNOSIS — E039 Hypothyroidism, unspecified: Secondary | ICD-10-CM | POA: Diagnosis present

## 2024-06-17 DIAGNOSIS — R918 Other nonspecific abnormal finding of lung field: Secondary | ICD-10-CM | POA: Diagnosis not present

## 2024-06-17 DIAGNOSIS — K219 Gastro-esophageal reflux disease without esophagitis: Secondary | ICD-10-CM | POA: Diagnosis present

## 2024-06-17 DIAGNOSIS — I700 Atherosclerosis of aorta: Secondary | ICD-10-CM | POA: Diagnosis not present

## 2024-06-17 DIAGNOSIS — R0602 Shortness of breath: Principal | ICD-10-CM

## 2024-06-17 DIAGNOSIS — E119 Type 2 diabetes mellitus without complications: Secondary | ICD-10-CM

## 2024-06-17 DIAGNOSIS — I447 Left bundle-branch block, unspecified: Secondary | ICD-10-CM | POA: Diagnosis not present

## 2024-06-17 DIAGNOSIS — F411 Generalized anxiety disorder: Secondary | ICD-10-CM | POA: Diagnosis present

## 2024-06-17 DIAGNOSIS — R079 Chest pain, unspecified: Secondary | ICD-10-CM | POA: Diagnosis not present

## 2024-06-17 DIAGNOSIS — E1140 Type 2 diabetes mellitus with diabetic neuropathy, unspecified: Secondary | ICD-10-CM | POA: Insufficient documentation

## 2024-06-17 MED ORDER — SODIUM CHLORIDE 0.9 % IV SOLN
1.0000 g | Freq: Once | INTRAVENOUS | Status: AC
  Administered 2024-06-18: 1 g via INTRAVENOUS
  Filled 2024-06-17: qty 10

## 2024-06-17 MED ORDER — MAGNESIUM SULFATE 2 GM/50ML IV SOLN
2.0000 g | Freq: Once | INTRAVENOUS | Status: AC
  Administered 2024-06-17: 2 g via INTRAVENOUS
  Filled 2024-06-17: qty 50

## 2024-06-17 MED ORDER — IPRATROPIUM-ALBUTEROL 0.5-2.5 (3) MG/3ML IN SOLN
3.0000 mL | Freq: Once | RESPIRATORY_TRACT | Status: AC
  Administered 2024-06-17: 3 mL via RESPIRATORY_TRACT
  Filled 2024-06-17: qty 3

## 2024-06-17 MED ORDER — DOXYCYCLINE HYCLATE 100 MG PO TABS
100.0000 mg | ORAL_TABLET | Freq: Once | ORAL | Status: AC
  Administered 2024-06-18: 100 mg via ORAL
  Filled 2024-06-17: qty 1

## 2024-06-17 NOTE — ED Provider Notes (Signed)
 [place] [facility] Provider Note   CSN: [id number] Arrival date & time: 06/17/24  2148     Patient presents with: Shortness of Breath   [name] [name] is a 68 y.o. female with medical history significant for COPD, asthma, current tobacco user, chronic hypoxic respiratory failure on 2 L nasal cannula, hypertension, hyperlipidemia, prediabetes, diabetic polyneuropathy, GERD, chronic HFpEF, obstructive CAD presents Emergency Department via EMS for evaluation of shortness of breath, productive cough for the past week.  Also endorses that she gained 7 pounds in 1 week.  Reports compliance with lasix  and takes it daily. Smokes 8 cigarettes a day.  Was most recently admitted 11/29-11/30 for COPD exacerbation.  EMS provided 2 DuoNeb's and Solu-Medrol  prior to arrival with some improvement of symptoms  Last echo 06/13/24 notable for LVEF 65-70%    Shortness of Breath      Prior to Admission medications   Medication Sig Start Date End Date Taking? Authorizing Provider  acetaminophen  (TYLENOL ) 500 MG tablet Take 1 tablet (500 mg total) by mouth every 6 (six) hours as needed for mild pain (pain score 1-3), fever or headache. 06/13/24   Arlice Reichert, MD  albuterol  (VENTOLIN  HFA) 108 (90 Base) MCG/ACT inhaler INHALE 2 PUFFS into THE lungs EVERY 6 HOURS AS NEEDED wheezing AND SHORTNESS OF BREATH 10/15/23   Vicci Barnie [name], MD  alendronate  (FOSAMAX ) 70 MG tablet Take 1 tablet (70 mg total) by mouth every 7 (seven) days. Take with a full glass of water on an empty stomach. 04/06/24   Vicci Barnie [name], MD  aspirin  EC 81 MG tablet Take 81 mg by mouth in the morning.    [provider]  atorvastatin  (LIPITOR ) 80 MG tablet Take 1 tablet (80 mg total) by mouth daily. 09/22/23   Daneen Damien BROCKS, NP  azithromycin  (ZITHROMAX ) 250 MG tablet Take 1 tablet (250 mg total) by mouth daily. 04/13/24   Kara Dorn [name], MD  busPIRone  (BUSPAR ) 15 MG tablet Take 2 tablets  (30 mg total) by mouth in the morning AND 1 tablet (15 mg total) daily at 12 noon AND 1 tablet (15 mg total) every evening. 05/18/24   Nwoko, Uchenna E, PA  clonazePAM  (KLONOPIN ) 0.5 MG tablet Take 1 tablet (0.5 mg total) by mouth 2 (two) times daily as needed. 06/03/24   Nwoko, Uchenna E, PA  doxepin  (SINEQUAN ) 75 MG capsule Take 1 capsule (75 mg total) by mouth at bedtime. 05/18/24   Nwoko, Uchenna E, PA  Dupilumab  (DUPIXENT ) 300 MG/2ML SOAJ Inject 300 mg into the skin every 14 (fourteen) days. 05/26/24   Kara Dorn [name], MD  escitalopram  (LEXAPRO ) 20 MG tablet Take 1 tablet (20 mg total) by mouth daily. 05/18/24   Nwoko, Uchenna E, PA  ezetimibe  (ZETIA ) 10 MG tablet Take 1 tablet (10 mg total) by mouth daily. 07/04/23   Meng, Hao, PA  Fluticasone -Umeclidin-Vilant (TRELEGY ELLIPTA ) 200-62.5-25 MCG/ACT AEPB Inhale 1 puff into the lungs daily. 08/07/23   Kara Dorn [name], MD  furosemide  (LASIX ) 40 MG tablet Take 1 tablet (40 mg total) by mouth daily. 06/03/24   Vicci Barnie [name], MD  gabapentin  (NEURONTIN ) 600 MG tablet Take 1 tablet (600 mg total) by mouth 2 (two) times daily. 12/04/23   Vicci Barnie [name], MD  hydrOXYzine  (ATARAX ) 25 MG tablet Take 1 tablet (25 mg total) by mouth at bedtime as needed. 05/18/24   Nwoko, Uchenna E, PA  ipratropium-albuterol  (DUONEB) 0.5-2.5 (3) MG/3ML SOLN Take 3 mLs by  nebulization every 8 (eight) hours. Patient taking differently: Take 3 mLs by nebulization every 8 (eight) hours. 01/15/24   Kara Dorn NOVAK, MD  levothyroxine  (SYNTHROID ) 112 MCG tablet Take 1 tablet (112 mcg total) by mouth daily. 01/22/24   Vicci Barnie NOVAK, MD  metFORMIN  (GLUCOPHAGE ) 500 MG tablet Take 1 tablet (500 mg total) by mouth 2 (two) times daily with a meal. 03/12/24   Vicci Barnie NOVAK, MD  mirtazapine  (REMERON ) 45 MG tablet Take 1 tablet (45 mg total) by mouth at bedtime. 05/18/24   Nwoko, Uchenna E, PA  montelukast  (SINGULAIR ) 10 MG tablet Take 1 tablet (10 mg total) by mouth at  bedtime. 11/11/23   Vicci Barnie NOVAK, MD  nebivolol  (BYSTOLIC ) 10 MG tablet Take 1 tablet (10 mg total) by mouth daily. 09/22/23   Daneen Damien BROCKS, NP  nitroGLYCERIN  (NITROSTAT ) 0.4 MG SL tablet Place 1 tablet (0.4 mg total) under the tongue every 5 (five) minutes as needed for chest pain. 04/15/24 07/14/24  Daneen Damien BROCKS, NP  OHTUVAYRE  3 MG/2.5ML SUSP  05/19/24   [provider]  omeprazole  (PRILOSEC) 40 MG capsule Take 1 capsule (40 mg total) by mouth 2 (two) times daily before a meal. NEED APPOINTMENT FOR ADDITIONAL REFILLS 04/21/24   Legrand Victory LITTIE DOUGLAS, MD  OXYGEN  Inhale 2-3 L/min into the lungs continuous.    [provider]  predniSONE  (DELTASONE ) 10 MG tablet Take 4 tablets (40 mg total) by mouth daily for 2 days, THEN 3 tablets (30 mg total) daily for 2 days, THEN 2 tablets (20 mg total) daily for 2 days, THEN 1 tablet (10 mg total) daily for 1 day. 06/13/24 06/20/24  Arlice Reichert, MD  ticagrelor  (BRILINTA ) 60 MG TABS tablet Take 1 tablet (60 mg total) by mouth 2 (two) times daily. 12/11/23   Burnard Debby LABOR, MD  varenicline  (CHANTIX ) 1 MG tablet Take 1 tablet (1 mg total) by mouth 2 (two) times daily. Patient not taking: Reported on 06/17/2024 04/21/24   Kara Dorn NOVAK, MD    Allergies: Avocado, Latex, and Codeine     Review of Systems  Respiratory:  Positive for shortness of breath.     Updated Vital Signs BP 136/67 (BP Location: Right Arm)   Pulse 99   Temp 98.1 F (36.7 C) (Oral)   Resp 17   Ht 5' 1 (1.549 m)   Wt 78.4 kg   SpO2 91%   BMI 32.66 kg/m   Physical Exam Vitals and nursing note reviewed.  Constitutional:      General: She is not in acute distress.    Appearance: Normal appearance.  HENT:     Head: Normocephalic and atraumatic.  Eyes:     Conjunctiva/sclera: Conjunctivae normal.  Cardiovascular:     Rate and Rhythm: Normal rate.     Pulses:          Dorsalis pedis pulses are 2+ on the right side and 2+ on the left side.  Pulmonary:      Effort: Pulmonary effort is normal. No respiratory distress.     Breath sounds: Decreased breath sounds and wheezing present.     Comments: Mild expiratory wheezing. Maintaining O2 sats with baseline O2. On 2L [place] Musculoskeletal:     Right lower leg: No edema.     Left lower leg: No edema.  Skin:    Coloration: Skin is not jaundiced or pale.  Neurological:     Mental Status: She is alert. Mental status is at baseline.     (  all labs ordered are listed, but only abnormal results are displayed) Labs Reviewed  RESP PANEL BY RT-PCR (RSV, FLU A&B, COVID)  [id number]  BASIC METABOLIC PANEL WITH GFR  CBC WITH DIFFERENTIAL/PLATELET  BRAIN NATRIURETIC PEPTIDE  TROPONIN I (HIGH SENSITIVITY)  TROPONIN I (HIGH SENSITIVITY)    EKG: EKG Interpretation Date/Time:  Thursday June 17 2024 23:22:21 EST Ventricular Rate:  76 PR Interval:  153 QRS Duration:  89 QT Interval:  414 QTC Calculation: 466 R Axis:   81  Text Interpretation: Sinus rhythm Borderline right axis deviation No significant change since last tracing Confirmed by Emil Share [phone]) on 06/17/2024 11:24:37 PM  Radiology: [facility] Chest 2 View Result Date: 06/17/2024 EXAM: 2 VIEW(S) XRAY OF THE CHEST 06/17/2024 11:07:00 PM COMPARISON: 06/12/2024 CLINICAL HISTORY: shob FINDINGS: LUNGS AND PLEURA: Left basilar airspace opacities, favored to represent atelectasis with infection not excluded. No pleural effusion or pneumothorax. HEART AND MEDIASTINUM: Atherosclerotic calcifications of aorta. BONES AND SOFT TISSUES: Multilevel degenerative changes of thoracic spine. Old left clavicular fracture. IMPRESSION: 1. Left basilar airspace opacities, favored to represent atelectasis with infection not excluded. Electronically signed by: Norman Gatlin MD 06/17/2024 11:14 PM EST RP Workstation: [id number]     Medications Ordered in the ED  cefTRIAXone  (ROCEPHIN ) 1 g in sodium chloride  0.9 % 100 mL IVPB (1 g Intravenous New Bag/Given 06/18/24 0024)   ipratropium-albuterol  (DUONEB) 0.5-2.5 (3) MG/3ML nebulizer solution 3 mL (3 mLs Nebulization Given 06/17/24 2312)  magnesium  sulfate IVPB 2 g 50 mL (0 g Intravenous Stopped 06/18/24 0018)  doxycycline  (VIBRA -TABS) tablet 100 mg (100 mg Oral Given 06/18/24 0023)                                    Medical Decision Making Amount and/or Complexity of Data Reviewed Labs: ordered. Radiology: ordered.  Risk Prescription drug management.   Patient presents to the ED for concern of shob, cough, wheezing, this involves an extensive number of treatment options, and is a complaint that carries with it a high risk of complications and morbidity.  The differential diagnosis includes COPD exacerbation, PNA, ACS, PE   Co morbidities that complicate the patient evaluation  COPD, HFpEF, O2 supplementation at baseline   Additional history obtained:  Additional history obtained from Nursing, Outside Medical Records, and Past Admission   External records from outside source obtained and reviewed including triage RN note, admission note from 06/13/24   Lab Tests:  Labs pending at sign out   Imaging Studies ordered:  I ordered imaging studies including CXR  I independently visualized and interpreted imaging which showed  Left basilar airspace opacities, favored to represent atelectasis with infection not excluded I agree with the radiologist interpretation   Cardiac Monitoring:  The patient was maintained on a cardiac monitor.  I personally viewed and interpreted the cardiac monitored which showed an underlying rhythm of: NSR with LBBB. No ischemic changes and similar to previous   Medicines ordered and prescription drug management:  I ordered medication including duoneb, mg, rocephin , doxy  for wheezing, suspected pna Reevaluation of the patient after these medicines showed that the patient improved I have reviewed the patients home medicines and have made adjustments as  needed    Problem List / ED Course:  SHOB Productive cough No physical signs of resp distress, tachypnea currently.  Maintaining sats with baseline 2L. Noted hypoxia down to 88% on VS however was not wearing O2 as  she is supposed to  Decreased lung sounds with expiratory wheezing Does not appear fluid overloaded on exam with no pedal edema. Will provide an additional duoneb, mag for wheezing CXR notable for Left basilar airspace opacities which may represent atelectasis vs PNA. No effusion. CXR from 06/12/24 wo abnormalities. However will treat for PNA as patient has productive cough for past week. Provided rocephin  and dozy as pt has hx of prolonged QT Labs pending at sign out   Reevaluation:  After the interventions noted above, I reevaluated the patient and found that they have :stayed the same    Dispostion:  Dispo pending labs, ambulation trial, reassessment. If patient desats with ambulation or requires additional o2 supplementation, abnormal acute workup, patient likely to be admitted. Otherwise if able to maintain sats, could consider DC with abx  Sign out to Leita Chancy PA pending completion of ED workup, dispo  Final diagnoses:  Shortness of breath  Chronic obstructive pulmonary disease, unspecified COPD type (HCC)  Pneumonia of left lower lobe due to infectious organism    ED Discharge Orders     None        Minnie Tinnie BRAVO, PA 06/18/24 0102    Emil Share, DO 06/18/24 1930

## 2024-06-17 NOTE — ED Triage Notes (Signed)
 PT arrived from home via [facility] c/o sob x 1 week, worse today. Pt noted to have hx COPD. EMS adm enroute adm 2 x duoneb, 125 mg solumedrol,

## 2024-06-17 NOTE — Progress Notes (Signed)
 Paramedicine Encounter    Patient ID: [name] [name], female    DOB: 03-Feb-1956, 68 y.o.   MRN: [id number]   Complaints Productive Cough  Assessment ATF Ms. Rone A&O x 4, skin W&D w/ good color.  Pt on home O2 increased to 4lpm via cannula assisted adding water to her concentrator humidifier.   Pt states she continues to have a productive cough with thick yellow secretions.  She is afebrile.  Denies chest pain or SOB.  Compliance with meds- Missed Wednesday's a.m. dose  Pill box filled Filled by daughter  Refills needed  NONE  Meds changes since last visit NONE    Social changes NONE   BP 130/80 (BP Location: Left Arm, Patient Position: Sitting, Cuff Size: Normal)   Pulse 67   Resp 18   Wt 173 lb (78.5 kg)   SpO2 97%   BMI 32.69 kg/m  Weight yesterday- not taken Last visit weight-167lb  ACTION: Home visit completed  [name] [phone] 06/17/24  Patient Care Team: Vicci Barnie [name], MD as PCP - General (Internal Medicine) Burnard Debby LABOR, MD (Inactive) as PCP - Cardiology (Cardiology) Shaaron Lamar HERO, MD as Consulting Physician (Gastroenterology) Onesimo Emaline Brink, MD as Consulting Physician (Hematology) Collene Reginia BRAVO, [place] as Physician Assistant (Physician Assistant) Carolee Heron [name], RN as Case Manager  Patient Active Problem List   Diagnosis Date Noted   Hypokalemia 01/08/2024   Acute on chronic heart failure with preserved ejection fraction (HFpEF, >= 50%) (HCC) 12/05/2023   At risk for polypharmacy 12/04/2023   Long-term current use of antidepressant 11/17/2023   Long-term current use of benzodiazepine 11/17/2023   Sigmoid diverticulitis 11/12/2023   Acute kidney injury superimposed on chronic kidney disease 11/12/2023   CKD (chronic kidney disease) stage 2, GFR 60-89 ml/min 11/12/2023   Sepsis secondary to sigmoid diverticulitis (HCC) 11/12/2023   Acute on chronic hypoxic respiratory failure (HCC) 10/05/2023   Pneumothorax, right  09/16/2023   COPD with acute exacerbation (HCC) 09/01/2023   Chronic hypoxic respiratory failure (HCC) 09/01/2023   Continuous dependence on cigarette smoking 09/01/2023   History of CAD (coronary artery disease) 09/01/2023   CAP (community acquired pneumonia) 09/01/2023   Full code status 06/14/2023   Acute hypoxic respiratory failure (HCC) 06/13/2023   Acute exacerbation of chronic obstructive pulmonary disease (COPD) (HCC) 06/12/2023   Nicotine  dependence 05/03/2023   Substance abuse (HCC) 09/10/2022   Iron  deficiency anemia 11/02/2021   Major depressive disorder, recurrent, in full remission with anxious distress 05/31/2021   Chronic neck pain 02/26/2021   GAD (generalized anxiety disorder) 01/08/2021   Esophageal dysphagia 04/28/2019   Bipolar disorder (HCC) 09/17/2018   Insomnia 10/28/2017   Prediabetes 05/28/2017   QT prolongation 03/25/2017   Psoriasis 06/22/2015   COPD (chronic obstructive pulmonary disease) (HCC)GOLD E 05/18/2015   Anxiety and depression 07/10/2013   Essential hypertension 06/07/2013   Hyperlipidemia 06/07/2013   Asthma, chronic 06/07/2013   Emphysema lung (HCC) 06/07/2013   Chronic back pain 06/07/2013   CAD S/P percutaneous coronary angioplasty 06/07/2013   GERD (gastroesophageal reflux disease) 06/07/2013   Breast lump on left side at 3 o'clock position 10/13/2012   S/P abdominal hysterectomy and right salpingo-oophorectomy 08/29/2011   COPD exacerbation (HCC) 09/15/2009   TOBACCO ABUSE 07/17/2009   Chronic rhinitis 07/17/2009   Lung nodule < 6cm on CT 07/17/2009   Hypothyroidism 12/22/2008   Chronic depression 12/22/2008    Current Outpatient Medications:    acetaminophen  (TYLENOL ) 500 MG tablet, Take 1 tablet (  500 mg total) by mouth every 6 (six) hours as needed for mild pain (pain score 1-3), fever or headache., Disp: , Rfl:    albuterol  (VENTOLIN  HFA) 108 (90 Base) MCG/ACT inhaler, INHALE 2 PUFFS into THE lungs EVERY 6 HOURS AS NEEDED  wheezing AND SHORTNESS OF BREATH, Disp: 54 g, Rfl: 1   alendronate  (FOSAMAX ) 70 MG tablet, Take 1 tablet (70 mg total) by mouth every 7 (seven) days. Take with a full glass of water on an empty stomach., Disp: 12 tablet, Rfl: 0   aspirin  EC 81 MG tablet, Take 81 mg by mouth in the morning., Disp: , Rfl:    atorvastatin  (LIPITOR ) 80 MG tablet, Take 1 tablet (80 mg total) by mouth daily., Disp: 90 tablet, Rfl: 3   busPIRone  (BUSPAR ) 15 MG tablet, Take 2 tablets (30 mg total) by mouth in the morning AND 1 tablet (15 mg total) daily at 12 noon AND 1 tablet (15 mg total) every evening., Disp: 360 tablet, Rfl: 0   clonazePAM  (KLONOPIN ) 0.5 MG tablet, Take 1 tablet (0.5 mg total) by mouth 2 (two) times daily as needed., Disp: 60 tablet, Rfl: 0   doxepin  (SINEQUAN ) 75 MG capsule, Take 1 capsule (75 mg total) by mouth at bedtime., Disp: 90 capsule, Rfl: 0   Dupilumab  (DUPIXENT ) 300 MG/2ML SOAJ, Inject 300 mg into the skin every 14 (fourteen) days., Disp: 4 mL, Rfl: 3   escitalopram  (LEXAPRO ) 20 MG tablet, Take 1 tablet (20 mg total) by mouth daily., Disp: 90 tablet, Rfl: 0   ezetimibe  (ZETIA ) 10 MG tablet, Take 1 tablet (10 mg total) by mouth daily., Disp: 90 tablet, Rfl: 3   Fluticasone -Umeclidin-Vilant (TRELEGY ELLIPTA ) 200-62.5-25 MCG/ACT AEPB, Inhale 1 puff into the lungs daily., Disp: 60 each, Rfl: 11   furosemide  (LASIX ) 40 MG tablet, Take 1 tablet (40 mg total) by mouth daily., Disp: 90 tablet, Rfl: 0   gabapentin  (NEURONTIN ) 600 MG tablet, Take 1 tablet (600 mg total) by mouth 2 (two) times daily., Disp: 180 tablet, Rfl: 2   hydrOXYzine  (ATARAX ) 25 MG tablet, Take 1 tablet (25 mg total) by mouth at bedtime as needed., Disp: 30 tablet, Rfl: 2   levothyroxine  (SYNTHROID ) 112 MCG tablet, Take 1 tablet (112 mcg total) by mouth daily., Disp: 90 tablet, Rfl: 2   metFORMIN  (GLUCOPHAGE ) 500 MG tablet, Take 1 tablet (500 mg total) by mouth 2 (two) times daily with a meal., Disp: 180 tablet, Rfl: 1    mirtazapine  (REMERON ) 45 MG tablet, Take 1 tablet (45 mg total) by mouth at bedtime., Disp: 90 tablet, Rfl: 0   montelukast  (SINGULAIR ) 10 MG tablet, Take 1 tablet (10 mg total) by mouth at bedtime., Disp: 90 tablet, Rfl: 2   nebivolol  (BYSTOLIC ) 10 MG tablet, Take 1 tablet (10 mg total) by mouth daily., Disp: 90 tablet, Rfl: 3   nitroGLYCERIN  (NITROSTAT ) 0.4 MG SL tablet, Place 1 tablet (0.4 mg total) under the tongue every 5 (five) minutes as needed for chest pain., Disp: 25 tablet, Rfl: 3   OHTUVAYRE  3 MG/2.5ML SUSP, , Disp: , Rfl:    omeprazole  (PRILOSEC) 40 MG capsule, Take 1 capsule (40 mg total) by mouth 2 (two) times daily before a meal. NEED APPOINTMENT FOR ADDITIONAL REFILLS, Disp: 60 capsule, Rfl: 0   OXYGEN , Inhale 2-3 L/min into the lungs continuous., Disp: , Rfl:    predniSONE  (DELTASONE ) 10 MG tablet, Take 4 tablets (40 mg total) by mouth daily for 2 days, THEN 3 tablets (30 mg total)  daily for 2 days, THEN 2 tablets (20 mg total) daily for 2 days, THEN 1 tablet (10 mg total) daily for 1 day., Disp: 19 tablet, Rfl: 0   ticagrelor  (BRILINTA ) 60 MG TABS tablet, Take 1 tablet (60 mg total) by mouth 2 (two) times daily., Disp: 180 tablet, Rfl: 2   azithromycin  (ZITHROMAX ) 250 MG tablet, Take 1 tablet (250 mg total) by mouth daily., Disp: 90 tablet, Rfl: 1   ipratropium-albuterol  (DUONEB) 0.5-2.5 (3) MG/3ML SOLN, Take 3 mLs by nebulization every 8 (eight) hours. (Patient taking differently: Take 3 mLs by nebulization every 8 (eight) hours.), Disp: 90 mL, Rfl: 1   varenicline  (CHANTIX ) 1 MG tablet, Take 1 tablet (1 mg total) by mouth 2 (two) times daily. (Patient not taking: Reported on 06/17/2024), Disp: 60 tablet, Rfl: 3 Allergies  Allergen Reactions   Avocado Anaphylaxis   Latex Shortness Of Breath and Rash   Codeine  Nausea Only     Social History   Socioeconomic History   Marital status: Divorced    Spouse name: Not on file   Number of children: 1   Years of education: Not on  file   Highest education level: 9th grade  Occupational History   Not on file  Tobacco Use   Smoking status: Every Day    Current packs/day: 0.50    Average packs/day: 0.5 packs/day for 50.0 years (25.0 ttl pk-yrs)    Types: Cigarettes   Smokeless tobacco: Never  Vaping Use   Vaping status: Some Days   Substances: Nicotine   Substance and Sexual Activity   Alcohol use: Yes    Comment: once a week   Drug use: Yes    Types: Marijuana    Comment: history of cocaine use, been about a year, per patient (12/18/21)   Sexual activity: Not Currently    Birth control/protection: Surgical  Other Topics Concern   Not on file  Social History Narrative   Not on file   Social Drivers of Health   Financial Resource Strain: Medium Risk (08/07/2023)   Overall Financial Resource Strain (CARDIA)    Difficulty of Paying Living Expenses: Somewhat hard  Food Insecurity: No Food Insecurity (06/12/2024)   Hunger Vital Sign    Worried About Running Out of Food in the Last Year: Never true    Ran Out of Food in the Last Year: Never true  Transportation Needs: No Transportation Needs (06/12/2024)   PRAPARE - Administrator, Civil Service (Medical): No    Lack of Transportation (Non-Medical): No  Physical Activity: Inactive (08/07/2023)   Exercise Vital Sign    Days of Exercise per Week: 0 days    Minutes of Exercise per Session: 0 min  Stress: No Stress Concern Present (08/07/2023)   [employer] of Occupational Health - Occupational Stress Questionnaire    Feeling of Stress : Only a little  Social Connections: Unknown (06/12/2024)   Social Connection and Isolation Panel    Frequency of Communication with Friends and Family: Not on file    Frequency of Social Gatherings with Friends and Family: Not on file    Attends Religious Services: 1 to 4 times per year    Active Member of [employer] or Organizations: Not on file    Attends Banker Meetings: Not on file    Marital  Status: Not on file  Intimate Partner Violence: Not At Risk (06/12/2024)   Humiliation, Afraid, Rape, and Kick questionnaire    Fear of Current or Ex-Partner:  No    Emotionally Abused: No    Physically Abused: No    Sexually Abused: No    Physical Exam      Future Appointments  Date Time Provider Department Center  06/21/2024  4:10 PM Vicci Barnie NOVAK, MD [facility] [address]  06/22/2024  8:45 AM Kara Dorn NOVAK, MD [facility] [address]  06/22/2024  4:10 PM [facility] ANNUAL WELLNESS VISIT [facility] [address]  07/13/2024  4:00 PM Floretta Mallard, MD [facility]  05/06/2025  1:30 PM [facility] None  05/06/2025  2:00 PM Onesimo Emaline Brink, MD [facility] None

## 2024-06-18 ENCOUNTER — Other Ambulatory Visit: Payer: Self-pay

## 2024-06-18 ENCOUNTER — Other Ambulatory Visit: Payer: Self-pay | Admitting: Pharmacy Technician

## 2024-06-18 DIAGNOSIS — F17210 Nicotine dependence, cigarettes, uncomplicated: Secondary | ICD-10-CM | POA: Diagnosis not present

## 2024-06-18 DIAGNOSIS — I2510 Atherosclerotic heart disease of native coronary artery without angina pectoris: Secondary | ICD-10-CM

## 2024-06-18 DIAGNOSIS — E1140 Type 2 diabetes mellitus with diabetic neuropathy, unspecified: Secondary | ICD-10-CM | POA: Insufficient documentation

## 2024-06-18 DIAGNOSIS — K219 Gastro-esophageal reflux disease without esophagitis: Secondary | ICD-10-CM | POA: Diagnosis not present

## 2024-06-18 DIAGNOSIS — Z8659 Personal history of other mental and behavioral disorders: Secondary | ICD-10-CM

## 2024-06-18 DIAGNOSIS — J189 Pneumonia, unspecified organism: Secondary | ICD-10-CM | POA: Diagnosis not present

## 2024-06-18 DIAGNOSIS — I5032 Chronic diastolic (congestive) heart failure: Secondary | ICD-10-CM | POA: Diagnosis not present

## 2024-06-18 DIAGNOSIS — F411 Generalized anxiety disorder: Secondary | ICD-10-CM

## 2024-06-18 DIAGNOSIS — A419 Sepsis, unspecified organism: Secondary | ICD-10-CM

## 2024-06-18 DIAGNOSIS — I5030 Unspecified diastolic (congestive) heart failure: Secondary | ICD-10-CM | POA: Insufficient documentation

## 2024-06-18 DIAGNOSIS — Z9861 Coronary angioplasty status: Secondary | ICD-10-CM

## 2024-06-18 DIAGNOSIS — J441 Chronic obstructive pulmonary disease with (acute) exacerbation: Secondary | ICD-10-CM | POA: Diagnosis not present

## 2024-06-18 DIAGNOSIS — J9621 Acute and chronic respiratory failure with hypoxia: Secondary | ICD-10-CM | POA: Diagnosis not present

## 2024-06-18 DIAGNOSIS — E119 Type 2 diabetes mellitus without complications: Secondary | ICD-10-CM

## 2024-06-18 DIAGNOSIS — E038 Other specified hypothyroidism: Secondary | ICD-10-CM

## 2024-06-18 LAB — CBC WITH DIFFERENTIAL/PLATELET
Abs Immature Granulocytes: 0.05 K/uL (ref 0.00–0.07)
Basophils Absolute: 0 K/uL (ref 0.0–0.1)
Basophils Relative: 0 %
Eosinophils Absolute: 0 K/uL (ref 0.0–0.5)
Eosinophils Relative: 0 %
HCT: 36.1 % (ref 36.0–46.0)
Hemoglobin: 11.2 g/dL — ABNORMAL LOW (ref 12.0–15.0)
Immature Granulocytes: 0 %
Lymphocytes Relative: 4 %
Lymphs Abs: 0.5 K/uL — ABNORMAL LOW (ref 0.7–4.0)
MCH: 28.2 pg (ref 26.0–34.0)
MCHC: 31 g/dL (ref 30.0–36.0)
MCV: 90.9 fL (ref 80.0–100.0)
Monocytes Absolute: 0.2 K/uL (ref 0.1–1.0)
Monocytes Relative: 1 %
Neutro Abs: 14 K/uL — ABNORMAL HIGH (ref 1.7–7.7)
Neutrophils Relative %: 95 %
Platelets: 438 K/uL — ABNORMAL HIGH (ref 150–400)
RBC: 3.97 MIL/uL (ref 3.87–5.11)
RDW: 16.5 % — ABNORMAL HIGH (ref 11.5–15.5)
WBC: 14.8 K/uL — ABNORMAL HIGH (ref 4.0–10.5)
nRBC: 0 % (ref 0.0–0.2)

## 2024-06-18 LAB — COMPREHENSIVE METABOLIC PANEL WITH GFR
ALT: 23 U/L (ref 0–44)
AST: 18 U/L (ref 15–41)
Albumin: 2.8 g/dL — ABNORMAL LOW (ref 3.5–5.0)
Alkaline Phosphatase: 55 U/L (ref 38–126)
Anion gap: 13 (ref 5–15)
BUN: 16 mg/dL (ref 8–23)
CO2: 30 mmol/L (ref 22–32)
Calcium: 8.9 mg/dL (ref 8.9–10.3)
Chloride: 98 mmol/L (ref 98–111)
Creatinine, Ser: 0.85 mg/dL (ref 0.44–1.00)
GFR, Estimated: >60 mL/min (ref >60)
Glucose, Bld: 200 mg/dL — ABNORMAL HIGH (ref 70–99)
Potassium: 4.5 mmol/L (ref 3.5–5.1)
Sodium: 141 mmol/L (ref 135–145)
Total Bilirubin: 0.4 mg/dL (ref 0.0–1.2)
Total Protein: 5.7 g/dL — ABNORMAL LOW (ref 6.5–8.1)

## 2024-06-18 LAB — CBC
HCT: 34.5 % — ABNORMAL LOW (ref 36.0–46.0)
Hemoglobin: 10.7 g/dL — ABNORMAL LOW (ref 12.0–15.0)
MCH: 28.3 pg (ref 26.0–34.0)
MCHC: 31 g/dL (ref 30.0–36.0)
MCV: 91.3 fL (ref 80.0–100.0)
Platelets: 450 K/uL — ABNORMAL HIGH (ref 150–400)
RBC: 3.78 MIL/uL — ABNORMAL LOW (ref 3.87–5.11)
RDW: 16.6 % — ABNORMAL HIGH (ref 11.5–15.5)
WBC: 13.9 K/uL — ABNORMAL HIGH (ref 4.0–10.5)
nRBC: 0 % (ref 0.0–0.2)

## 2024-06-18 LAB — LACTIC ACID, PLASMA: Lactic Acid, Venous: 2.4 mmol/L (ref 0.5–1.9)

## 2024-06-18 LAB — PROCALCITONIN: Procalcitonin: <0.1 ng/mL

## 2024-06-18 LAB — RESP PANEL BY RT-PCR (RSV, FLU A&B, COVID)  RVPGX2
Influenza A by PCR: NEGATIVE
Influenza B by PCR: NEGATIVE
Resp Syncytial Virus by PCR: NEGATIVE
SARS Coronavirus 2 by RT PCR: NEGATIVE

## 2024-06-18 LAB — HEPATIC FUNCTION PANEL
ALT: 22 U/L (ref 0–44)
AST: 17 U/L (ref 15–41)
Albumin: 2.8 g/dL — ABNORMAL LOW (ref 3.5–5.0)
Alkaline Phosphatase: 54 U/L (ref 38–126)
Bilirubin, Direct: <0.1 mg/dL (ref 0.0–0.2)
Total Bilirubin: 0.4 mg/dL (ref 0.0–1.2)
Total Protein: 5.9 g/dL — ABNORMAL LOW (ref 6.5–8.1)

## 2024-06-18 LAB — BASIC METABOLIC PANEL WITH GFR
Anion gap: 9 (ref 5–15)
BUN: 18 mg/dL (ref 8–23)
CO2: 32 mmol/L (ref 22–32)
Calcium: 9.2 mg/dL (ref 8.9–10.3)
Chloride: 100 mmol/L (ref 98–111)
Creatinine, Ser: 0.86 mg/dL (ref 0.44–1.00)
GFR, Estimated: >60 mL/min (ref >60)
Glucose, Bld: 193 mg/dL — ABNORMAL HIGH (ref 70–99)
Potassium: 4 mmol/L (ref 3.5–5.1)
Sodium: 141 mmol/L (ref 135–145)

## 2024-06-18 LAB — TROPONIN I (HIGH SENSITIVITY): Troponin I (High Sensitivity): 4 ng/L (ref <18)

## 2024-06-18 LAB — I-STAT CG4 LACTIC ACID, ED
Lactic Acid, Venous: 2.3 mmol/L (ref 0.5–1.9)
Lactic Acid, Venous: 2.3 mmol/L (ref 0.5–1.9)

## 2024-06-18 LAB — GLUCOSE, CAPILLARY
Glucose-Capillary: 236 mg/dL — ABNORMAL HIGH (ref 70–99)
Glucose-Capillary: 159 mg/dL — ABNORMAL HIGH (ref 70–99)

## 2024-06-18 LAB — CBG MONITORING, ED
Glucose-Capillary: 195 mg/dL — ABNORMAL HIGH (ref 70–99)
Glucose-Capillary: 128 mg/dL — ABNORMAL HIGH (ref 70–99)

## 2024-06-18 LAB — BRAIN NATRIURETIC PEPTIDE: B Natriuretic Peptide: 103.4 pg/mL — ABNORMAL HIGH (ref 0.0–100.0)

## 2024-06-18 MED ORDER — PIPERACILLIN-TAZOBACTAM 3.375 G IVPB
3.3750 g | Freq: Three times a day (TID) | INTRAVENOUS | Status: DC
  Administered 2024-06-18 – 2024-06-19 (×4): 3.375 g via INTRAVENOUS
  Filled 2024-06-18 (×4): qty 50

## 2024-06-18 MED ORDER — ATORVASTATIN CALCIUM 80 MG PO TABS
80.0000 mg | ORAL_TABLET | Freq: Every day | ORAL | Status: DC
  Administered 2024-06-18 – 2024-06-22 (×5): 80 mg via ORAL
  Filled 2024-06-18 (×2): qty 1
  Filled 2024-06-18: qty 2
  Filled 2024-06-18 (×2): qty 1

## 2024-06-18 MED ORDER — SODIUM CHLORIDE 0.9% FLUSH
3.0000 mL | Freq: Two times a day (BID) | INTRAVENOUS | Status: DC
  Administered 2024-06-18 – 2024-06-21 (×8): 3 mL via INTRAVENOUS

## 2024-06-18 MED ORDER — ESCITALOPRAM OXALATE 20 MG PO TABS
20.0000 mg | ORAL_TABLET | Freq: Every day | ORAL | Status: DC
  Administered 2024-06-18 – 2024-06-22 (×5): 20 mg via ORAL
  Filled 2024-06-18: qty 2
  Filled 2024-06-18 (×4): qty 1

## 2024-06-18 MED ORDER — LACTATED RINGERS IV BOLUS (SEPSIS)
1000.0000 mL | Freq: Once | INTRAVENOUS | Status: AC
  Administered 2024-06-18: 1000 mL via INTRAVENOUS

## 2024-06-18 MED ORDER — METHYLPREDNISOLONE SODIUM SUCC 40 MG IJ SOLR
40.0000 mg | Freq: Every day | INTRAMUSCULAR | Status: DC
  Administered 2024-06-18: 40 mg via INTRAVENOUS
  Filled 2024-06-18: qty 1

## 2024-06-18 MED ORDER — TICAGRELOR 60 MG PO TABS
60.0000 mg | ORAL_TABLET | Freq: Two times a day (BID) | ORAL | Status: DC
  Administered 2024-06-18 – 2024-06-22 (×9): 60 mg via ORAL
  Filled 2024-06-18 (×10): qty 1

## 2024-06-18 MED ORDER — SODIUM CHLORIDE 0.9% FLUSH: 3.0000 mL | INTRAVENOUS | Status: DC | PRN

## 2024-06-18 MED ORDER — EZETIMIBE 10 MG PO TABS
10.0000 mg | ORAL_TABLET | Freq: Every day | ORAL | Status: DC
  Administered 2024-06-18 – 2024-06-22 (×5): 10 mg via ORAL
  Filled 2024-06-18 (×5): qty 1

## 2024-06-18 MED ORDER — MONTELUKAST SODIUM 10 MG PO TABS
10.0000 mg | ORAL_TABLET | Freq: Every day | ORAL | Status: DC
  Administered 2024-06-18 – 2024-06-21 (×4): 10 mg via ORAL
  Filled 2024-06-18 (×4): qty 1

## 2024-06-18 MED ORDER — INSULIN ASPART 100 UNIT/ML IJ SOLN
0.0000 [IU] | Freq: Every day | INTRAMUSCULAR | Status: DC
  Administered 2024-06-19: 2 [IU] via SUBCUTANEOUS
  Administered 2024-06-21: 3 [IU] via SUBCUTANEOUS
  Filled 2024-06-18: qty 2
  Filled 2024-06-18: qty 3

## 2024-06-18 MED ORDER — NICOTINE 14 MG/24HR TD PT24
14.0000 mg | MEDICATED_PATCH | Freq: Every day | TRANSDERMAL | Status: DC
  Administered 2024-06-19 – 2024-06-22 (×4): 14 mg via TRANSDERMAL
  Filled 2024-06-18 (×4): qty 1

## 2024-06-18 MED ORDER — HYDROXYZINE HCL 25 MG PO TABS: 25.0000 mg | ORAL_TABLET | Freq: Four times a day (QID) | ORAL | Status: DC | PRN

## 2024-06-18 MED ORDER — IPRATROPIUM-ALBUTEROL 0.5-2.5 (3) MG/3ML IN SOLN
3.0000 mL | Freq: Four times a day (QID) | RESPIRATORY_TRACT | Status: DC | PRN
  Administered 2024-06-19 – 2024-06-20 (×2): 3 mL via RESPIRATORY_TRACT
  Filled 2024-06-18 (×2): qty 3

## 2024-06-18 MED ORDER — LEVOTHYROXINE SODIUM 112 MCG PO TABS
112.0000 ug | ORAL_TABLET | Freq: Every day | ORAL | Status: DC
  Administered 2024-06-18 – 2024-06-22 (×5): 112 ug via ORAL
  Filled 2024-06-18 (×5): qty 1

## 2024-06-18 MED ORDER — LACTATED RINGERS IV BOLUS: 1000.0000 mL | INTRAVENOUS | Status: DC

## 2024-06-18 MED ORDER — NEBIVOLOL HCL 10 MG PO TABS
10.0000 mg | ORAL_TABLET | Freq: Every day | ORAL | Status: DC
  Administered 2024-06-20 – 2024-06-22 (×3): 10 mg via ORAL
  Filled 2024-06-18 (×4): qty 1

## 2024-06-18 MED ORDER — ASPIRIN 81 MG PO TBEC
81.0000 mg | DELAYED_RELEASE_TABLET | Freq: Every day | ORAL | Status: DC
  Administered 2024-06-18 – 2024-06-22 (×5): 81 mg via ORAL
  Filled 2024-06-18 (×5): qty 1

## 2024-06-18 MED ORDER — NITROGLYCERIN 0.4 MG SL SUBL: 0.4000 mg | SUBLINGUAL_TABLET | SUBLINGUAL | Status: DC | PRN

## 2024-06-18 MED ORDER — VANCOMYCIN HCL 1750 MG/350ML IV SOLN
1750.0000 mg | Freq: Once | INTRAVENOUS | Status: AC
  Administered 2024-06-18: 1750 mg via INTRAVENOUS
  Filled 2024-06-18: qty 350

## 2024-06-18 MED ORDER — SODIUM CHLORIDE 0.9% FLUSH
3.0000 mL | Freq: Two times a day (BID) | INTRAVENOUS | Status: DC
  Administered 2024-06-18 (×2): 3 mL via INTRAVENOUS

## 2024-06-18 MED ORDER — VANCOMYCIN HCL 750 MG/150ML IV SOLN
750.0000 mg | INTRAVENOUS | Status: DC
  Administered 2024-06-19: 750 mg via INTRAVENOUS
  Filled 2024-06-18: qty 150

## 2024-06-18 MED ORDER — BUSPIRONE HCL 5 MG PO TABS
15.0000 mg | ORAL_TABLET | Freq: Every day | ORAL | Status: DC
  Administered 2024-06-18 – 2024-06-21 (×4): 15 mg via ORAL
  Filled 2024-06-18: qty 3
  Filled 2024-06-18: qty 1
  Filled 2024-06-18: qty 3
  Filled 2024-06-18: qty 2
  Filled 2024-06-18: qty 1
  Filled 2024-06-18: qty 3

## 2024-06-18 MED ORDER — DOXEPIN HCL 25 MG PO CAPS
75.0000 mg | ORAL_CAPSULE | Freq: Every day | ORAL | Status: DC
  Administered 2024-06-18 – 2024-06-21 (×4): 75 mg via ORAL
  Filled 2024-06-18: qty 3
  Filled 2024-06-18: qty 1
  Filled 2024-06-18 (×3): qty 3

## 2024-06-18 MED ORDER — MIRTAZAPINE 15 MG PO TABS
45.0000 mg | ORAL_TABLET | Freq: Every day | ORAL | Status: DC
  Administered 2024-06-18 – 2024-06-21 (×4): 45 mg via ORAL
  Filled 2024-06-18 (×4): qty 3

## 2024-06-18 MED ORDER — GUAIFENESIN ER 600 MG PO TB12
600.0000 mg | ORAL_TABLET | Freq: Two times a day (BID) | ORAL | Status: DC
  Administered 2024-06-18 – 2024-06-22 (×9): 600 mg via ORAL
  Filled 2024-06-18 (×9): qty 1

## 2024-06-18 MED ORDER — PANTOPRAZOLE SODIUM 40 MG PO TBEC
40.0000 mg | DELAYED_RELEASE_TABLET | Freq: Every day | ORAL | Status: DC
  Administered 2024-06-18 – 2024-06-22 (×5): 40 mg via ORAL
  Filled 2024-06-18 (×5): qty 1

## 2024-06-18 MED ORDER — ACETAMINOPHEN 325 MG PO TABS
650.0000 mg | ORAL_TABLET | Freq: Four times a day (QID) | ORAL | Status: DC | PRN
  Administered 2024-06-19 (×2): 650 mg via ORAL
  Filled 2024-06-18 (×3): qty 2

## 2024-06-18 MED ORDER — LACTATED RINGERS IV SOLN
INTRAVENOUS | Status: DC
  Administered 2024-06-18: 125 mL/h via INTRAVENOUS

## 2024-06-18 MED ORDER — SODIUM CHLORIDE 0.9 % IV SOLN: INTRAVENOUS | Status: DC

## 2024-06-18 MED ORDER — ENOXAPARIN SODIUM 40 MG/0.4ML IJ SOSY
40.0000 mg | PREFILLED_SYRINGE | INTRAMUSCULAR | Status: DC
  Administered 2024-06-18 – 2024-06-21 (×4): 40 mg via SUBCUTANEOUS
  Filled 2024-06-18 (×4): qty 0.4

## 2024-06-18 MED ORDER — BUDESON-GLYCOPYRROL-FORMOTEROL 160-9-4.8 MCG/ACT IN AERO
2.0000 | INHALATION_SPRAY | Freq: Two times a day (BID) | RESPIRATORY_TRACT | Status: DC
  Administered 2024-06-18 – 2024-06-22 (×8): 2 via RESPIRATORY_TRACT
  Filled 2024-06-18: qty 5.9

## 2024-06-18 MED ORDER — PREDNISONE 20 MG PO TABS
40.0000 mg | ORAL_TABLET | Freq: Every day | ORAL | Status: DC
  Administered 2024-06-19: 40 mg via ORAL
  Filled 2024-06-18: qty 2

## 2024-06-18 MED ORDER — GABAPENTIN 300 MG PO CAPS
600.0000 mg | ORAL_CAPSULE | Freq: Two times a day (BID) | ORAL | Status: DC
  Administered 2024-06-18 – 2024-06-22 (×9): 600 mg via ORAL
  Filled 2024-06-18 (×9): qty 2

## 2024-06-18 MED ORDER — ACETAMINOPHEN 650 MG RE SUPP: 650.0000 mg | Freq: Four times a day (QID) | RECTAL | Status: DC | PRN

## 2024-06-18 MED ORDER — LACTATED RINGERS IV BOLUS: 1000.0000 mL | Freq: Once | INTRAVENOUS | Status: DC

## 2024-06-18 MED ORDER — CLONAZEPAM 0.5 MG PO TABS
0.5000 mg | ORAL_TABLET | Freq: Two times a day (BID) | ORAL | Status: DC | PRN
  Administered 2024-06-18 – 2024-06-22 (×7): 0.5 mg via ORAL
  Filled 2024-06-18 (×7): qty 1

## 2024-06-18 MED ORDER — SODIUM CHLORIDE 0.9 % IV SOLN: 250.0000 mL | INTRAVENOUS | Status: DC | PRN

## 2024-06-18 MED ORDER — BUSPIRONE HCL 5 MG PO TABS
15.0000 mg | ORAL_TABLET | Freq: Every evening | ORAL | Status: DC
  Administered 2024-06-18 – 2024-06-21 (×4): 15 mg via ORAL
  Filled 2024-06-18 (×3): qty 3
  Filled 2024-06-18 (×2): qty 1
  Filled 2024-06-18: qty 3

## 2024-06-18 MED ORDER — BUSPIRONE HCL 5 MG PO TABS
30.0000 mg | ORAL_TABLET | Freq: Every day | ORAL | Status: DC
  Administered 2024-06-18 – 2024-06-22 (×5): 30 mg via ORAL
  Filled 2024-06-18 (×3): qty 6
  Filled 2024-06-18: qty 2
  Filled 2024-06-18: qty 3
  Filled 2024-06-18: qty 6

## 2024-06-18 MED ORDER — BENZONATATE 100 MG PO CAPS
100.0000 mg | ORAL_CAPSULE | Freq: Three times a day (TID) | ORAL | Status: DC | PRN
  Administered 2024-06-19: 100 mg via ORAL
  Filled 2024-06-18: qty 1

## 2024-06-18 MED ORDER — INSULIN ASPART 100 UNIT/ML IJ SOLN
0.0000 [IU] | Freq: Three times a day (TID) | INTRAMUSCULAR | Status: DC
  Administered 2024-06-18: 3 [IU] via SUBCUTANEOUS
  Administered 2024-06-18: 2 [IU] via SUBCUTANEOUS
  Administered 2024-06-18: 1 [IU] via SUBCUTANEOUS
  Administered 2024-06-19 – 2024-06-20 (×3): 2 [IU] via SUBCUTANEOUS
  Administered 2024-06-21: 3 [IU] via SUBCUTANEOUS
  Filled 2024-06-18: qty 3
  Filled 2024-06-18 (×2): qty 2
  Filled 2024-06-18: qty 3
  Filled 2024-06-18: qty 1
  Filled 2024-06-18: qty 2
  Filled 2024-06-18: qty 3

## 2024-06-18 NOTE — Sepsis Progress Note (Signed)
 Notified bedside nurse of need to draw repeat lactic acid.

## 2024-06-18 NOTE — Progress Notes (Signed)
  Progress Note   Patient: [name] [name] FMW:[id number] DOB: [birth year] DOA: 06/17/2024     0 DOS: the patient was seen and examined on 06/18/2024   Same day admission note:  Admitted with COPD exacerbation, sepsis and pneumonia in the setting of continued tobacco abuse. Feels better now but still having intermittent coughing spells. Smokes 8 cigarettes per day, used to smoke 1 ppd. She has been smoking since age 68. Changed IV solumedrol to po prednisone  40 mg daily. Lives at home by herself and uses 3 L oxygen  at all times. Needs on discharge.  Author: Deliliah Room, MD 06/18/2024 1:41 PM  For on call review [email or web address].

## 2024-06-18 NOTE — Sepsis Progress Note (Signed)
 Elink monitoring for the code sepsis protocol.

## 2024-06-18 NOTE — Progress Notes (Signed)
 Specialty Pharmacy Refill Coordination Note  [name] [name] is a 68 y.o. female contacted today regarding refills of specialty medication(s)  Dupilumab  (Dupixent )   Patient requested (Proxy-Rptd) Delivery   Delivery date: 06/23/2024 Verified address: (Proxy-Rptd) [address] [place] , [place] [address]   Medication will be filled on: 06/22/2024

## 2024-06-18 NOTE — Plan of Care (Signed)
  Problem: Education: Goal: Ability to describe self-care measures that may prevent or decrease complications (Diabetes Survival Skills Education) will improve Outcome: Progressing Goal: Individualized Educational Video(s) Outcome: Progressing   Problem: Coping: Goal: Ability to adjust to condition or change in health will improve Outcome: Progressing   Problem: Fluid Volume: Goal: Ability to maintain a balanced intake and output will improve Outcome: Progressing   Problem: Health Behavior/Discharge Planning: Goal: Ability to identify and utilize available resources and services will improve Outcome: Progressing Goal: Ability to manage health-related needs will improve Outcome: Progressing   Problem: Metabolic: Goal: Ability to maintain appropriate glucose levels will improve Outcome: Progressing   Problem: Nutritional: Goal: Maintenance of adequate nutrition will improve Outcome: Progressing Goal: Progress toward achieving an optimal weight will improve Outcome: Progressing   Problem: Skin Integrity: Goal: Risk for impaired skin integrity will decrease Outcome: Progressing   Problem: Tissue Perfusion: Goal: Adequacy of tissue perfusion will improve Outcome: Progressing   Problem: Education: Goal: Knowledge of General Education information will improve Description: Including pain rating scale, medication(s)/side effects and non-pharmacologic comfort measures Outcome: Progressing   Problem: Health Behavior/Discharge Planning: Goal: Ability to manage health-related needs will improve Outcome: Progressing   Problem: Clinical Measurements: Goal: Ability to maintain clinical measurements within normal limits will improve Outcome: Progressing Goal: Will remain free from infection Outcome: Progressing Goal: Diagnostic test results will improve Outcome: Progressing Goal: Respiratory complications will improve Outcome: Progressing Goal: Cardiovascular complication will  be avoided Outcome: Progressing   Problem: Activity: Goal: Risk for activity intolerance will decrease Outcome: Progressing   Problem: Nutrition: Goal: Adequate nutrition will be maintained Outcome: Progressing   Problem: Coping: Goal: Level of anxiety will decrease Outcome: Progressing   Problem: Elimination: Goal: Will not experience complications related to bowel motility Outcome: Progressing Goal: Will not experience complications related to urinary retention Outcome: Progressing   Problem: Pain Managment: Goal: General experience of comfort will improve and/or be controlled Outcome: Progressing   Problem: Safety: Goal: Ability to remain free from injury will improve Outcome: Progressing   Problem: Skin Integrity: Goal: Risk for impaired skin integrity will decrease Outcome: Progressing   Problem: Education: Goal: Knowledge of disease or condition will improve Outcome: Progressing Goal: Knowledge of the prescribed therapeutic regimen will improve Outcome: Progressing Goal: Individualized Educational Video(s) Outcome: Progressing   Problem: Activity: Goal: Ability to tolerate increased activity will improve Outcome: Progressing Goal: Will verbalize the importance of balancing activity with adequate rest periods Outcome: Progressing   Problem: Respiratory: Goal: Ability to maintain a clear airway will improve Outcome: Progressing Goal: Levels of oxygenation will improve Outcome: Progressing Goal: Ability to maintain adequate ventilation will improve Outcome: Progressing   Problem: Activity: Goal: Ability to tolerate increased activity will improve Outcome: Progressing   Problem: Clinical Measurements: Goal: Ability to maintain a body temperature in the normal range will improve Outcome: Progressing   Problem: Respiratory: Goal: Ability to maintain adequate ventilation will improve Outcome: Progressing Goal: Ability to maintain a clear airway will  improve Outcome: Progressing

## 2024-06-18 NOTE — H&P (Signed)
 History and Physical    [name] [name] FMW:[id number] DOB: [birth year] DOA: 06/17/2024  PCP: Vicci Barnie [name], MD   Patient coming from: Home   Chief Complaint:  Chief Complaint  Patient presents with   Shortness of Breath   ED TRIAGE note:  HPI:  [name] [name] is a 68 y.o. female with medical history significant of COPD, chronic hypoxic respiratory failure, chronic smoking cigarette, non-insulin -dependent DM type II, HFpEF, essential hypertension, diabetic polyneuropathy, prolonged QTc, anxiety, depression, GERD, hypothyroidism, hyperlipidemia and morbid obesity presented to emergency department complaining of shortness of breath, productive cough for 1 week.  Patient also reporting gain weight around 7 pounds in 1 week even though she has been compliant with Lasix .  Continue to smoke 8 to 10 cigarettes/day.  EMS provided DuoNeb and Solu-Medrol  prior to arrival with improvement of symptoms. Patient denies any palpitation, lower extremity swelling, chest pain, fever and chill.  No other complaint at this time.  Patient was recently admitted and discharged 5 days ago for acute hypoxic respiratory failure in the setting of COPD exacerbation.  At presentation to ED O2 sat 88% at room air which improved to 91% to liter oxygen .  Otherwise hemodynamically stable.  Lab work, elevated lactic acid level 2.3.  BNP 103.[name]  CBC showing leukocytosis 14.8, stable H&H and elevated platelet count 438. Pending Bmp.  Blood cultures are in process.  Pending troponin level.  Pending respiratory panel.  Chest x-ray showing left basilar infiltrate concern for atelectasis and infection.  In the ED patient received 1 L of LR bolus, DuoNeb, doxycycline , ceftriaxone  and mag sulfate 2 g.  Hospitalist consulted for further evaluation management of COPD exacerbation, acute on chronic hypoxic respiratory failure and sepsis secondary to community-acquired pneumonia.   Significant labs in the ED: Lab Orders          Resp panel by RT-PCR (RSV, Flu A&B, Covid) Anterior Nasal Swab         Culture, blood (routine x 2)         MRSA Next Gen by PCR, Nasal         Basic metabolic panel         CBC with Differential         Brain natriuretic peptide         Hepatic function panel         Lactic acid, plasma         Legionella Pneumophila Serogp 1 Ur Ag         Strep pneumoniae urinary antigen         Procalcitonin         Comprehensive metabolic panel         CBC         I-Stat Lactic Acid       Review of Systems:  Review of Systems  Constitutional:  Negative for chills, fever, malaise/fatigue and weight loss.  Respiratory:  Positive for cough, sputum production, shortness of breath and wheezing.   Cardiovascular:  Negative for chest pain, palpitations and leg swelling.  Gastrointestinal:  Negative for abdominal pain, diarrhea, heartburn, nausea and vomiting.  Musculoskeletal:  Negative for back pain, joint pain, myalgias and neck pain.  Neurological:  Negative for dizziness and headaches.  Psychiatric/Behavioral:  The patient is not nervous/anxious.     Past Medical History:  Diagnosis Date   Allergy    seasonal   Anemia    Anxiety    takes Lexapro  daily   Arthritis  back, from neck down pass my bra area (03/25/2017)   Asthma    Bartholin gland cyst 08/29/2011   Bruises easily    pt is on Effient    Chronic back pain    herniated nucleus pulposus   Chronic back pain    neck to bra area; lower back (03/25/2017)   Chronic kidney disease    recurrent UTI's this year 2022   COPD (chronic obstructive pulmonary disease) (HCC)    early stages   Coronary artery disease    Depression    takes Klonopin  daily   Diabetes mellitus without complication (HCC)    Diverticulosis    Fibroadenoma of left breast    GERD (gastroesophageal reflux disease)    takes Nexium  daily   H/O hiatal hernia    Heart attack (HCC) 2011   Hemorrhoids    Hernia    Hyperlipidemia    takes Lipitor  daily    Hypertension    takes Losartan  daily and Labetalol  bid   Hypothyroidism    takes Synthroid  daily   Insomnia    hydroxyzine  prn   Joint pain    Pneumonia    couple times (03/25/2017)   Pre-diabetes    just found out 1 wk ago (03/25/2017)   Psoriasis    elbows,knees,back   Shortness of breath    with exertion   Slowing of urinary stream    Stress incontinence     Past Surgical History:  Procedure Laterality Date   ABDOMINAL EXPLORATION SURGERY  1977   ABDOMINAL HYSTERECTOMY  1977   left one of my ovaries   BACK SURGERY     BIOPSY  05/27/2019   Procedure: BIOPSY;  Surgeon: Shaaron Lamar HERO, MD;  Location: [facility];  Service: Endoscopy;;   BREAST BIOPSY Left 11/2021   x2   COLONOSCOPY  2011   Dr. Teressa: Mild diverticulosis, descending diminutive colon polyp (not retrieved), next colonoscopy 10 years   CORONARY ANGIOPLASTY WITH STENT PLACEMENT  2011 X2   regular stents didn't work; had to go back in in ~ 1 month and put in medicated stents   DILATION AND CURETTAGE OF UTERUS     ESOPHAGOGASTRODUODENOSCOPY     ESOPHAGOGASTRODUODENOSCOPY (EGD) WITH PROPOFOL  N/A 05/27/2019   normal esophagus, dilation, erosive gastropathy s/p biopsy, normal duodenum. Negative H.pylori.    LEFT HEART CATH AND CORONARY ANGIOGRAPHY N/A 03/26/2017   Procedure: LEFT HEART CATH AND CORONARY ANGIOGRAPHY;  Surgeon: Claudene Victory ORN, MD;  Location: [facility];  Service: Cardiovascular;  Laterality: N/A;   LUMBAR LAMINECTOMY/DECOMPRESSION MICRODISCECTOMY  08/05/2011   Procedure: LUMBAR LAMINECTOMY/DECOMPRESSION MICRODISCECTOMY;  Surgeon: Lynwood JONELLE Mill, MD;  Location: [facility];  Service: Neurosurgery;  Laterality: Right;  Right Lumbar four-five extraforaminal discectomy   MALONEY DILATION N/A 05/27/2019   Procedure: AGAPITO DILATION;  Surgeon: Shaaron Lamar HERO, MD;  Location: [facility];  Service: Endoscopy;  Laterality: N/A;  54   SHOULDER ARTHROSCOPY WITH ROTATOR CUFF REPAIR AND OPEN  BICEPS TENODESIS Right 12/24/2021   Procedure: RIGHT SHOULDER ARTHROSCOPY WITH ROTATOR CUFF REPAIR AND BICEPS TENODESIS;  Surgeon: Barbarann Oneil BROCKS, MD;  Location: [facility];  Service: Orthopedics;  Laterality: Right;   TONSILLECTOMY     as a child   UPPER GASTROINTESTINAL ENDOSCOPY       reports that she has been smoking cigarettes. She has a 25 pack-year smoking history. She has never used smokeless tobacco. She reports current alcohol use. She reports current drug use. Drug: Marijuana.  Allergies  Allergen Reactions   Avocado Anaphylaxis   Latex Shortness Of Breath and Rash   Codeine  Nausea Only    Family History  Problem Relation Age of Onset   Heart disease Mother    Hypertension Mother    Stroke Mother    Mental illness Mother    Heart disease Father    Hypertension Father    Diabetes Father    Colon polyps Brother    Heart disease Maternal Grandfather    Cancer Paternal Grandmother        mouth   Anesthesia problems Daughter    Breast cancer Maternal Aunt    Throat cancer Maternal Uncle    Thyroid  disease Paternal Aunt    Hypotension Neg Hx    Malignant hyperthermia Neg Hx    Pseudochol deficiency Neg Hx    Colon cancer Neg Hx    Stomach cancer Neg Hx    Esophageal cancer Neg Hx    Pancreatic cancer Neg Hx    Rectal cancer Neg Hx     Prior to Admission medications   Medication Sig Start Date End Date Taking? Authorizing Provider  acetaminophen  (TYLENOL ) 500 MG tablet Take 1 tablet (500 mg total) by mouth every 6 (six) hours as needed for mild pain (pain score 1-3), fever or headache. 06/13/24   Arlice Reichert, MD  albuterol  (VENTOLIN  HFA) 108 (90 Base) MCG/ACT inhaler INHALE 2 PUFFS into THE lungs EVERY 6 HOURS AS NEEDED wheezing AND SHORTNESS OF BREATH 10/15/23   Vicci Barnie NOVAK, MD  alendronate  (FOSAMAX ) 70 MG tablet Take 1 tablet (70 mg total) by mouth every 7 (seven) days. Take with a full glass of water on an empty stomach. 04/06/24   Vicci Barnie NOVAK, MD   aspirin  EC 81 MG tablet Take 81 mg by mouth in the morning.    [provider]  atorvastatin  (LIPITOR ) 80 MG tablet Take 1 tablet (80 mg total) by mouth daily. 09/22/23   Daneen Damien BROCKS, NP  azithromycin  (ZITHROMAX ) 250 MG tablet Take 1 tablet (250 mg total) by mouth daily. 04/13/24   Kara Dorn NOVAK, MD  busPIRone  (BUSPAR ) 15 MG tablet Take 2 tablets (30 mg total) by mouth in the morning AND 1 tablet (15 mg total) daily at 12 noon AND 1 tablet (15 mg total) every evening. 05/18/24   Nwoko, Uchenna E, PA  clonazePAM  (KLONOPIN ) 0.5 MG tablet Take 1 tablet (0.5 mg total) by mouth 2 (two) times daily as needed. 06/03/24   Nwoko, Uchenna E, PA  doxepin  (SINEQUAN ) 75 MG capsule Take 1 capsule (75 mg total) by mouth at bedtime. 05/18/24   Nwoko, Uchenna E, PA  Dupilumab  (DUPIXENT ) 300 MG/2ML SOAJ Inject 300 mg into the skin every 14 (fourteen) days. 05/26/24   Kara Dorn NOVAK, MD  escitalopram  (LEXAPRO ) 20 MG tablet Take 1 tablet (20 mg total) by mouth daily. 05/18/24   Nwoko, Uchenna E, PA  ezetimibe  (ZETIA ) 10 MG tablet Take 1 tablet (10 mg total) by mouth daily. 07/04/23   Meng, Hao, PA  Fluticasone -Umeclidin-Vilant (TRELEGY ELLIPTA ) 200-62.5-25 MCG/ACT AEPB Inhale 1 puff into the lungs daily. 08/07/23   Kara Dorn NOVAK, MD  furosemide  (LASIX ) 40 MG tablet Take 1 tablet (40 mg total) by mouth daily. 06/03/24   Vicci Barnie NOVAK, MD  gabapentin  (NEURONTIN ) 600 MG tablet Take 1 tablet (600 mg total) by mouth 2 (two) times daily. 12/04/23   Vicci Barnie NOVAK, MD  hydrOXYzine  (ATARAX ) 25 MG tablet Take 1 tablet (25 mg total)  by mouth at bedtime as needed. 05/18/24   Nwoko, Uchenna E, PA  ipratropium-albuterol  (DUONEB) 0.5-2.5 (3) MG/3ML SOLN Take 3 mLs by nebulization every 8 (eight) hours. Patient taking differently: Take 3 mLs by nebulization every 8 (eight) hours. 01/15/24   Kara Dorn NOVAK, MD  levothyroxine  (SYNTHROID ) 112 MCG tablet Take 1 tablet (112 mcg total) by mouth daily. 01/22/24    Vicci Barnie NOVAK, MD  metFORMIN  (GLUCOPHAGE ) 500 MG tablet Take 1 tablet (500 mg total) by mouth 2 (two) times daily with a meal. 03/12/24   Vicci Barnie NOVAK, MD  mirtazapine  (REMERON ) 45 MG tablet Take 1 tablet (45 mg total) by mouth at bedtime. 05/18/24   Nwoko, Uchenna E, PA  montelukast  (SINGULAIR ) 10 MG tablet Take 1 tablet (10 mg total) by mouth at bedtime. 11/11/23   Vicci Barnie NOVAK, MD  nebivolol  (BYSTOLIC ) 10 MG tablet Take 1 tablet (10 mg total) by mouth daily. 09/22/23   Daneen Damien BROCKS, NP  nitroGLYCERIN  (NITROSTAT ) 0.4 MG SL tablet Place 1 tablet (0.4 mg total) under the tongue every 5 (five) minutes as needed for chest pain. 04/15/24 07/14/24  Daneen Damien BROCKS, NP  OHTUVAYRE  3 MG/2.5ML SUSP  05/19/24   [provider]  omeprazole  (PRILOSEC) 40 MG capsule Take 1 capsule (40 mg total) by mouth 2 (two) times daily before a meal. NEED APPOINTMENT FOR ADDITIONAL REFILLS 04/21/24   Legrand Victory LITTIE DOUGLAS, MD  OXYGEN  Inhale 2-3 L/min into the lungs continuous.    [provider]  predniSONE  (DELTASONE ) 10 MG tablet Take 4 tablets (40 mg total) by mouth daily for 2 days, THEN 3 tablets (30 mg total) daily for 2 days, THEN 2 tablets (20 mg total) daily for 2 days, THEN 1 tablet (10 mg total) daily for 1 day. 06/13/24 06/20/24  Arlice Reichert, MD  ticagrelor  (BRILINTA ) 60 MG TABS tablet Take 1 tablet (60 mg total) by mouth 2 (two) times daily. 12/11/23   Burnard Debby LABOR, MD  varenicline  (CHANTIX ) 1 MG tablet Take 1 tablet (1 mg total) by mouth 2 (two) times daily. Patient not taking: Reported on 06/17/2024 04/21/24   Kara Dorn NOVAK, MD     Physical Exam: Vitals:   06/17/24 2217 06/17/24 2229 06/18/24 0045 06/18/24 0145  BP:   (!) 116/54 (!) 119/49  Pulse:   78 74  Resp:   19 18  Temp:    98 F (36.7 C)  TempSrc:    Oral  SpO2: 91%  100% 97%  Weight:  78.4 kg    Height:  5' 1 (1.549 m)      Physical Exam Vitals and nursing note reviewed.  Constitutional:      General:  She is not in acute distress.    Appearance: She is not ill-appearing.  Cardiovascular:     Rate and Rhythm: Normal rate and regular rhythm.  Pulmonary:     Effort: Pulmonary effort is normal.     Breath sounds: Wheezing present. No decreased breath sounds, rhonchi or rales.  Musculoskeletal:     Right lower leg: No edema.     Left lower leg: No edema.  Skin:    General: Skin is warm and dry.     Capillary Refill: Capillary refill takes less than 2 seconds.  Neurological:     Mental Status: She is alert and oriented to person, place, and time.  Psychiatric:        Mood and Affect: Mood normal.      Labs  on Admission: I have personally reviewed following labs and imaging studies  CBC: Recent Labs  Lab 06/12/24 1509 06/12/24 1520 06/13/24 0431 06/18/24 0039  WBC 13.4*  --  13.5* 14.8*  NEUTROABS 7.4  --   --  14.0*  HGB 11.1* 11.9* 11.8* 11.2*  HCT 36.6 35.0* 38.7 36.1  MCV 94.3  --  93.3 90.9  PLT 402*  --  418* 438*   Basic Metabolic Panel: Recent Labs  Lab 06/12/24 1509 06/12/24 1520 06/13/24 0431  NA 141 140 142  K 4.5 4.3 4.3  CL 106  --  102  CO2 23  --  25  GLUCOSE 173*  --  137*  BUN 7*  --  11  CREATININE 0.65  --  0.83  CALCIUM  8.5*  --  8.9  MG  --   --  2.3  PHOS  --   --  3.5   GFR: Estimated Creatinine Clearance: 61.4 mL/min (by C-G formula based on SCr of 0.83 mg/dL). Liver Function Tests: No results for input(s): AST, ALT, ALKPHOS, BILITOT, PROT, ALBUMIN in the last 168 hours. No results for input(s): LIPASE, AMYLASE in the last 168 hours. No results for input(s): AMMONIA in the last 168 hours. Coagulation Profile: No results for input(s): INR, PROTIME in the last 168 hours. Cardiac Enzymes: Recent Labs  Lab 06/12/24 1509  TROPONINIHS 4   BNP (last 3 results) Recent Labs    12/05/23 0547 06/12/24 1528 06/18/24 0039  BNP 72.9 81.1 103.4*   HbA1C: No results for input(s): HGBA1C in the last 72  hours. CBG: Recent Labs  Lab 06/12/24 2150 06/13/24 0906 06/13/24 1219  GLUCAP 341* 149* 184*   Lipid Profile: No results for input(s): CHOL, HDL, LDLCALC, TRIG, CHOLHDL, LDLDIRECT in the last 72 hours. Thyroid  Function Tests: No results for input(s): TSH, T4TOTAL, FREET4, T3FREE, THYROIDAB in the last 72 hours. Anemia Panel: No results for input(s): VITAMINB12, FOLATE, FERRITIN, TIBC, IRON , RETICCTPCT in the last 72 hours. Urine analysis:    Component Value Date/Time   COLORURINE STRAW (A) 11/26/2023 1250   APPEARANCEUR CLEAR 11/26/2023 1250   LABSPEC 1.005 11/26/2023 1250   PHURINE 6.0 11/26/2023 1250   GLUCOSEU NEGATIVE 11/26/2023 1250   HGBUR NEGATIVE 11/26/2023 1250   BILIRUBINUR NEGATIVE 11/26/2023 1250   BILIRUBINUR negative 11/24/2020 1018   KETONESUR NEGATIVE 11/26/2023 1250   PROTEINUR NEGATIVE 11/26/2023 1250   UROBILINOGEN 0.2 11/24/2020 1018   UROBILINOGEN 0.2 08/09/2014 1028   NITRITE NEGATIVE 11/26/2023 1250   LEUKOCYTESUR NEGATIVE 11/26/2023 1250    Radiological Exams on Admission: I have personally reviewed images DG Chest 2 View Result Date: 06/17/2024 EXAM: 2 VIEW(S) XRAY OF THE CHEST 06/17/2024 11:07:00 PM COMPARISON: 06/12/2024 CLINICAL HISTORY: shob FINDINGS: LUNGS AND PLEURA: Left basilar airspace opacities, favored to represent atelectasis with infection not excluded. No pleural effusion or pneumothorax. HEART AND MEDIASTINUM: Atherosclerotic calcifications of aorta. BONES AND SOFT TISSUES: Multilevel degenerative changes of thoracic spine. Old left clavicular fracture. IMPRESSION: 1. Left basilar airspace opacities, favored to represent atelectasis with infection not excluded. Electronically signed by: Norman Gatlin MD 06/17/2024 11:14 PM EST RP Workstation: [id number]     EKG: My personal interpretation of EKG shows: EKG showing normal sinus rhythm heart rate 76, normal QTc interval.  Borderline right axis  deviation.  Assessment/Plan: Active Problems:   COPD with acute exacerbation (HCC)   Acute on chronic hypoxic respiratory failure (HCC)   Sepsis due to pneumonia (HCC)   Hypothyroidism   CAD S/P percutaneous  coronary angioplasty   GERD (gastroesophageal reflux disease)   GAD (generalized anxiety disorder)   Continuous dependence on cigarette smoking   (HFpEF) heart failure with preserved ejection fraction (HCC)   Non-insulin  dependent type 2 diabetes mellitus (HCC)   History of depression   Diabetic neuropathy (HCC)    Assessment and Plan: COPD with acute exacerbation Acute on chronic hypoxic respiratory failure -Presented to emergency department complaining of worsening shortness of breath, cough and wheezing since discharge from the hospital also reported gain 7 pound of weight even though has been compliant with Lasix  at home. - At presentation to ED patient O2 sat dropped to 88% room air, patient is hypotensive, afebrile.  Elevated lactic acid 2.6.  Chest x-ray showed maintenance of pneumonia.  CBC showing leukocytosis. - Patient recently admitted 1 week ago for COPD exacerbation and acute hypoxic respiratory failure.  Fortunately patient continues to smoke cigarette 8 to 10/day. - And route to ED patient received Solu-Medrol  and DuoNeb nebulizer - Continue Solu-Medrol  40 mg daily, DuoNeb every 6 hour as needed, Breztri  twice daily.  Continue broad-spectrum coverage with IV vancomycin  and Zosyn  given high risk for MRSA and Pseudomonas pneumonia from recent hospital admission 1 week ago. -Continue supplemental oxygen  and supportive care.   Community-acquired pneumonia Sepsis secondary to pneumonia -Patient met sepsis criteria.  In the ED treated with 2 L of LR bolus currently on maintenance fluid LR 125 cc/h.  Continue broad-spectrum antibiotic coverage with IV vancomycin  and Zosyn . - Need to follow-up with blood culture, sputum culture, urine Legionella, urine strep, procalcitonin  level and pending respiratory panel. -Continue to trend lactic acid level  Chronic smoking cigarette -Continue nicotine  patch.  Counsel provided for smoking cessation  History of GERD -Continue Protonix   History of CAD s/p cardiac stent -Continue aspirin , Lipitor , Nebivolol  and Brilinta   Chronic diastolic heart failure -Slight elevated BNP 103.  Recent echo EF 65 to 70% 11/30.  Holding Lasix  in the setting of sepsis.  Currently on maintenance fluid.  Continue beta-blocker.  Non-insulin -dependent DM type II - Hold metformin  in the setting of elevated lactic acid.  Continue sliding scale insulin  coverage with mealtime   History of depression and anxiety -Continue BuSpar , Lexapro , and Remeron .   Diabetic polyneuropathy -Continue gabapentin   Hypothyroidism - Continue levothyroxine    DVT prophylaxis:  Lovenox  Code Status:  Full Code Diet: Heart healthy carb modified diet Family Communication:   Family was present at bedside, at the time of interview. Opportunity was given to ask question and all questions were answered satisfactorily.  Disposition Plan: Continue to monitor improvement of COPD.  Pending culture results. Consults: None indicated at this time Admission status:   Inpatient, Telemetry bed  Severity of Illness: The appropriate patient status for this patient is INPATIENT. Inpatient status is judged to be reasonable and necessary in order to provide the required intensity of service to ensure the patient's safety. The patient's presenting symptoms, physical exam findings, and initial radiographic and laboratory data in the context of their chronic comorbidities is felt to place them at high risk for further clinical deterioration. Furthermore, it is not anticipated that the patient will be medically stable for discharge from the hospital within 2 midnights of admission.   * I certify that at the point of admission it is my clinical judgment that the patient will require  inpatient hospital care spanning beyond 2 midnights from the point of admission due to high intensity of service, high risk for further deterioration and high frequency of surveillance  required.DEWAINE    Jerah Esty, MD [facility]  How to contact the [facility] Attending or Consulting provider 7A - 7P or covering provider during after hours 7P -7A, for this patient.  Check the care team in [facility] and look for a) attending/consulting [facility] provider listed and b) the [facility] team listed Log into [email or web address] and use [place] universal password to access. If you do not have the password, please contact the hospital operator. Locate the [facility] provider you are looking for under [facility] and page to a number that you can be directly reached. If you still have difficulty reaching the provider, please page the [facility] (Director on Call) for the Hospitalists listed on amion for assistance.  06/18/2024, 2:05 AM

## 2024-06-18 NOTE — ED Provider Notes (Signed)
 68yo female with SHOB. CXR with possible PNA, productive cough x 1 week, hx COPD. Nebs and solumedrol with EMS Got mag, duoneb in ER, not much improvement. Abx for possible PNA On 2L at baseline. Maintaining.  Anticipate admission Physical Exam  BP 136/67 (BP Location: Right Arm)   Pulse 99   Temp 98.1 F (36.7 C) (Oral)   Resp 17   Ht 5' 1 (1.549 m)   Wt 78.4 kg   SpO2 91%   BMI 32.66 kg/m   Physical Exam Vitals and nursing note reviewed.  Constitutional:      Appearance: She is well-developed.  HENT:     Head: Normocephalic and atraumatic.  Cardiovascular:     Rate and Rhythm: Normal rate and regular rhythm.  Pulmonary:     Effort: Pulmonary effort is normal.     Breath sounds: Examination of the right-lower field reveals decreased breath sounds. Examination of the left-lower field reveals decreased breath sounds. Decreased breath sounds and wheezing present.  Skin:    General: Skin is warm and dry.     Findings: No ecchymosis or erythema.  Neurological:     General: No focal deficit present.     Mental Status: She is oriented to person, place, and time.  Psychiatric:        Behavior: Behavior normal.     Procedures  .Critical Care  Performed by: Beverley Leita LABOR, PA-C Authorized by: Beverley Leita LABOR, PA-C   Critical care provider statement:    Critical care time (minutes):  30   Critical care was necessary to treat or prevent imminent or life-threatening deterioration of the following conditions:  Sepsis   Critical care was time spent personally by me on the following activities:  Development of treatment plan with patient or surrogate, discussions with consultants, evaluation of patient's response to treatment, examination of patient, ordering and review of laboratory studies, ordering and review of radiographic studies, ordering and performing treatments and interventions, pulse oximetry, re-evaluation of patient's condition and review of old charts   ED Course  / MDM    Medical Decision Making Amount and/or Complexity of Data Reviewed Labs: ordered. Radiology: ordered.  Risk Prescription drug management. Decision regarding hospitalization.   CBC with elevated WBC at 14.8, possibly due to recent steroids vs related to her PNA. Lactic acid and cultures were added due to concern for PNA with elevated WBC. Lactic elevated at 2.3. Afebrile, BP normal. IV fluids ordered.  Abx already provided. Discussed results with patient, will consult for admission.  Case discussed with Dr. Sundil with [facility], will consult for admission.        Beverley Leita LABOR, PA-C 06/18/24 9783    Bari Charmaine FALCON, MD 06/20/24 [phone]

## 2024-06-18 NOTE — Progress Notes (Signed)
 Pharmacy Antibiotic Note  [name] [name] is a 68 y.o. female admitted on 06/17/2024 with SOB.  Pharmacy has been consulted for zosyn /vancomycin  dosing for pneumonia.  -WBC 14.8, sCr 0.83 (~bl), afebrile -CXR: Left basilar opacities atelectasis; infection not excluded -3/6 Gram stain: serratia marcescens   Plan: -Zosyn  3.375gm Iv every 8 hours -Vancomycin  1750mg  IV x1 -Vancomycin  750mg  IV every 24 hours (AUC 424, Vd 0.5, IBW, sCr 0.83) -Order MRSA PCR -Monitor renal function -Follow up signs of clinical improvement, LOT, de-escalation of antibiotics   Height: 5' 1 (154.9 cm) Weight: 78.4 kg (172 lb 13.5 oz) IBW/kg (Calculated) : 47.8  Temp (24hrs), Avg:98.1 F (36.7 C), Min:98.1 F (36.7 C), Max:98.1 F (36.7 C)  Recent Labs  Lab 06/12/24 1509 06/13/24 0431 06/18/24 0039 06/18/24 0114  WBC 13.4* 13.5* 14.8*  --   CREATININE 0.65 0.83  --   --   LATICACIDVEN  --   --   --  2.3*    Estimated Creatinine Clearance: 61.4 mL/min (by C-G formula based on SCr of 0.83 mg/dL).    Allergies  Allergen Reactions   Avocado Anaphylaxis   Latex Shortness Of Breath and Rash   Codeine  Nausea Only    Antimicrobials this admission: Zosyn  12/5 >>  Vancomycin  12/5 >>   Microbiology results: 12/5 BCx:  12/5 MRSA PCR: ordered  Thank you for allowing pharmacy to be a part of this patient's care.  Lynwood Poplar, PharmD, BCPS Clinical Pharmacist 06/18/2024 2:08 AM

## 2024-06-18 NOTE — Care Management CC44 (Signed)
 Condition Code 44 Documentation Completed  Patient Details  Name: [name] [name] MRN: [id number] Date of Birth: [birth year]   Condition Code 44 given:  Yes Patient signature on Condition Code 44 notice:  Yes Documentation of 2 MD's agreement:  Yes Code 44 added to claim:  Yes    [name], RN 06/18/2024, 2:42 PM

## 2024-06-18 NOTE — Care Management Obs Status (Signed)
 MEDICARE OBSERVATION STATUS NOTIFICATION   Patient Details  Name: [name] [name] MRN: [id number] Date of Birth: 01-Oct-1955   Medicare Observation Status Notification Given:  Yes    [name], RN 06/18/2024, 2:41 PM

## 2024-06-19 ENCOUNTER — Observation Stay (HOSPITAL_COMMUNITY)

## 2024-06-19 DIAGNOSIS — I700 Atherosclerosis of aorta: Secondary | ICD-10-CM | POA: Diagnosis not present

## 2024-06-19 DIAGNOSIS — S42002A Fracture of unspecified part of left clavicle, initial encounter for closed fracture: Secondary | ICD-10-CM | POA: Diagnosis not present

## 2024-06-19 DIAGNOSIS — J441 Chronic obstructive pulmonary disease with (acute) exacerbation: Secondary | ICD-10-CM | POA: Diagnosis not present

## 2024-06-19 DIAGNOSIS — R0602 Shortness of breath: Secondary | ICD-10-CM | POA: Diagnosis not present

## 2024-06-19 DIAGNOSIS — J9621 Acute and chronic respiratory failure with hypoxia: Secondary | ICD-10-CM | POA: Diagnosis not present

## 2024-06-19 DIAGNOSIS — R918 Other nonspecific abnormal finding of lung field: Secondary | ICD-10-CM | POA: Diagnosis not present

## 2024-06-19 LAB — BASIC METABOLIC PANEL WITH GFR
Anion gap: 7 (ref 5–15)
BUN: 21 mg/dL (ref 8–23)
CO2: 27 mmol/L (ref 22–32)
Calcium: 8.9 mg/dL (ref 8.9–10.3)
Chloride: 106 mmol/L (ref 98–111)
Creatinine, Ser: 0.79 mg/dL (ref 0.44–1.00)
GFR, Estimated: >60 mL/min (ref >60)
Glucose, Bld: 116 mg/dL — ABNORMAL HIGH (ref 70–99)
Potassium: 4.5 mmol/L (ref 3.5–5.1)
Sodium: 140 mmol/L (ref 135–145)

## 2024-06-19 LAB — CBC WITH DIFFERENTIAL/PLATELET
Abs Immature Granulocytes: 0.1 K/uL — ABNORMAL HIGH (ref 0.00–0.07)
Basophils Absolute: 0 K/uL (ref 0.0–0.1)
Basophils Relative: 0 %
Eosinophils Absolute: 0 K/uL (ref 0.0–0.5)
Eosinophils Relative: 0 %
HCT: 33.8 % — ABNORMAL LOW (ref 36.0–46.0)
Hemoglobin: 10.4 g/dL — ABNORMAL LOW (ref 12.0–15.0)
Immature Granulocytes: 1 %
Lymphocytes Relative: 11 %
Lymphs Abs: 2.1 K/uL (ref 0.7–4.0)
MCH: 28.6 pg (ref 26.0–34.0)
MCHC: 30.8 g/dL (ref 30.0–36.0)
MCV: 92.9 fL (ref 80.0–100.0)
Monocytes Absolute: 1.5 K/uL — ABNORMAL HIGH (ref 0.1–1.0)
Monocytes Relative: 8 %
Neutro Abs: 15 K/uL — ABNORMAL HIGH (ref 1.7–7.7)
Neutrophils Relative %: 80 %
Platelets: 416 K/uL — ABNORMAL HIGH (ref 150–400)
RBC: 3.64 MIL/uL — ABNORMAL LOW (ref 3.87–5.11)
RDW: 16.9 % — ABNORMAL HIGH (ref 11.5–15.5)
WBC: 18.8 K/uL — ABNORMAL HIGH (ref 4.0–10.5)
nRBC: 0 % (ref 0.0–0.2)

## 2024-06-19 LAB — PROCALCITONIN: Procalcitonin: <0.1 ng/mL

## 2024-06-19 LAB — GLUCOSE, CAPILLARY
Glucose-Capillary: 105 mg/dL — ABNORMAL HIGH (ref 70–99)
Glucose-Capillary: 199 mg/dL — ABNORMAL HIGH (ref 70–99)
Glucose-Capillary: 180 mg/dL — ABNORMAL HIGH (ref 70–99)
Glucose-Capillary: 228 mg/dL — ABNORMAL HIGH (ref 70–99)

## 2024-06-19 LAB — C-REACTIVE PROTEIN: CRP: <0.5 mg/dL (ref <1.0)

## 2024-06-19 MED ORDER — SODIUM CHLORIDE 0.9 % IV SOLN: INTRAVENOUS | Status: DC

## 2024-06-19 MED ORDER — METHYLPREDNISOLONE SODIUM SUCC 125 MG IJ SOLR
60.0000 mg | INTRAMUSCULAR | Status: DC
  Administered 2024-06-19 – 2024-06-21 (×3): 60 mg via INTRAVENOUS
  Filled 2024-06-19 (×3): qty 2

## 2024-06-19 MED ORDER — AZITHROMYCIN 500 MG PO TABS
500.0000 mg | ORAL_TABLET | Freq: Every day | ORAL | Status: DC
  Administered 2024-06-19 – 2024-06-21 (×3): 500 mg via ORAL
  Filled 2024-06-19 (×3): qty 1

## 2024-06-19 MED ORDER — FUROSEMIDE 10 MG/ML IJ SOLN
40.0000 mg | Freq: Once | INTRAMUSCULAR | Status: AC
  Administered 2024-06-19: 40 mg via INTRAVENOUS
  Filled 2024-06-19: qty 4

## 2024-06-19 NOTE — Plan of Care (Signed)
  Problem: Education: Goal: Ability to describe self-care measures that may prevent or decrease complications (Diabetes Survival Skills Education) will improve Outcome: Progressing   Problem: Coping: Goal: Ability to adjust to condition or change in health will improve Outcome: Progressing   Problem: Fluid Volume: Goal: Ability to maintain a balanced intake and output will improve Outcome: Progressing   Problem: Health Behavior/Discharge Planning: Goal: Ability to identify and utilize available resources and services will improve Outcome: Progressing Goal: Ability to manage health-related needs will improve Outcome: Progressing   Problem: Metabolic: Goal: Ability to maintain appropriate glucose levels will improve Outcome: Progressing   Problem: Nutritional: Goal: Maintenance of adequate nutrition will improve Outcome: Progressing Goal: Progress toward achieving an optimal weight will improve Outcome: Progressing   Problem: Skin Integrity: Goal: Risk for impaired skin integrity will decrease Outcome: Progressing   Problem: Education: Goal: Knowledge of General Education information will improve Description: Including pain rating scale, medication(s)/side effects and non-pharmacologic comfort measures Outcome: Progressing   Problem: Health Behavior/Discharge Planning: Goal: Ability to manage health-related needs will improve Outcome: Progressing   Problem: Clinical Measurements: Goal: Ability to maintain clinical measurements within normal limits will improve Outcome: Progressing Goal: Will remain free from infection Outcome: Progressing Goal: Diagnostic test results will improve Outcome: Progressing Goal: Respiratory complications will improve Outcome: Progressing Goal: Cardiovascular complication will be avoided Outcome: Progressing   Problem: Coping: Goal: Level of anxiety will decrease Outcome: Progressing   Problem: Elimination: Goal: Will not experience  complications related to bowel motility Outcome: Progressing Goal: Will not experience complications related to urinary retention Outcome: Progressing   Problem: Pain Managment: Goal: General experience of comfort will improve and/or be controlled Outcome: Progressing   Problem: Education: Goal: Knowledge of disease or condition will improve Outcome: Progressing Goal: Knowledge of the prescribed therapeutic regimen will improve Outcome: Progressing Goal: Individualized Educational Video(s) Outcome: Progressing   Problem: Activity: Goal: Ability to tolerate increased activity will improve Outcome: Progressing Goal: Will verbalize the importance of balancing activity with adequate rest periods Outcome: Progressing   Problem: Respiratory: Goal: Ability to maintain a clear airway will improve Outcome: Progressing Goal: Levels of oxygenation will improve Outcome: Progressing Goal: Ability to maintain adequate ventilation will improve Outcome: Progressing   Problem: Activity: Goal: Ability to tolerate increased activity will improve Outcome: Progressing   Problem: Clinical Measurements: Goal: Ability to maintain a body temperature in the normal range will improve Outcome: Progressing

## 2024-06-19 NOTE — Evaluation (Signed)
 Physical Therapy Evaluation Patient Details Name: [name] [name] MRN: [id number] DOB: 05-24-1956 Today's Date: 06/19/2024  History of Present Illness  68 y.o. female presented 12/4 complaining of shortness of breath, productive cough. Dx Acute on chronic hypoxic respiratory failure due to COPD exacerbation and acute bronchitis. With medical history significant of COPD, chronic hypoxic respiratory failure is about 3 L of nasal cannula oxygen  at home, chronic smoking cigarette, non-insulin -dependent DM type II, HFpEF, essential hypertension, diabetic polyneuropathy, prolonged QTc, anxiety, depression, GERD, hypothyroidism, hyperlipidemia and morbid obesity.   Clinical Impression  Pt admitted with above diagnosis. Independent PTA using 3-4L supplemental O2 at all times. Daughter takes her shopping for groceries and to appointments. Denies using AD or any recent falls. She was able to transfer and ambulate at a supervision level today. SpO2 94% on 6L supplemental O2 while ambulating, mod dyspnea and requires 3 short standing rest breaks. May benefit from pulmonary rehab follow-up. Will follow and progress acutely. Pt currently with functional limitations due to the deficits listed below (see PT Problem List). Pt will benefit from acute skilled PT to increase their independence and safety with mobility to allow discharge.           If plan is discharge home, recommend the following: Assist for transportation;Help with stairs or ramp for entrance     Equipment Recommendations None recommended by PT     Functional Status Assessment Patient has had a recent decline in their functional status and demonstrates the ability to make significant improvements in function in a reasonable and predictable amount of time.     Precautions / Restrictions Precautions Precautions: Fall Recall of Precautions/Restrictions: Intact Restrictions Weight Bearing Restrictions Per Provider Order: No      Mobility  Bed  Mobility Overal bed mobility: Modified Independent             General bed mobility comments: Extra time, no assist.    Transfers Overall transfer level: Needs assistance   Transfers: Sit to/from Stand Sit to Stand: Supervision           General transfer comment: Supervision for safety from EOB, able to stabilize without physical support, denies AD use. Mod I from toilet.    Ambulation/Gait Ambulation/Gait assistance: Supervision Gait Distance (Feet): 160 Feet Assistive device: None Gait Pattern/deviations: Step-through pattern, Decreased stance time - left, Wide base of support Gait velocity: dec Gait velocity interpretation: <1.8 ft/sec, indicate of risk for recurrent falls   General Gait Details: Declines to use AD, shows mild instability but no overt LOB. Leans against wall to rest at times. SpO2 94% on 6L supplemental O2 with moderate DOE. Feels more fatigued than usual while walking. Educated on symptom awareness and energy conservation techniques. Required 3 short duration standing breaks to complete distance. She agrees that rollator would help with efficency.  Stairs            Wheelchair Mobility     Tilt Bed    Modified Rankin (Stroke Patients Only)       Balance Overall balance assessment: Mild deficits observed, not formally tested                                           Pertinent Vitals/Pain Pain Assessment Pain Assessment: No/denies pain    Home Living Family/patient expects to be discharged to:: Private residence Living Arrangements: Alone Available Help at Discharge: Family;Available PRN/intermittently  Type of Home: House Home Access: Stairs to enter Entrance Stairs-Rails: Left Entrance Stairs-Number of Steps: 2   Home Layout: One level Home Equipment: Agricultural Consultant (2 wheels);Shower seat;Rollator (4 wheels);Cane - single point;Crutches (lift chair on the way)      Prior Function Prior Level of Function :  Independent/Modified Independent             Mobility Comments: Denies falls, does not use her ADs, wears 3-4 L O2. ADLs Comments: Dtr drives her places as needed.     Extremity/Trunk Assessment   Upper Extremity Assessment Upper Extremity Assessment: Defer to OT evaluation    Lower Extremity Assessment Lower Extremity Assessment: Generalized weakness       Communication   Communication Communication: No apparent difficulties    Cognition Arousal: Alert Behavior During Therapy: WFL for tasks assessed/performed   PT - Cognitive impairments: No apparent impairments                         Following commands: Intact       Cueing Cueing Techniques: Verbal cues     General Comments General comments (skin integrity, edema, etc.): SpO2 at rest on 5L 93%    Exercises     Assessment/Plan    PT Assessment Patient needs continued PT services  PT Problem List Decreased strength;Decreased activity tolerance;Decreased balance;Decreased mobility;Decreased knowledge of use of DME;Cardiopulmonary status limiting activity;Obesity       PT Treatment Interventions DME instruction;Gait training;Stair training;Functional mobility training;Therapeutic activities;Therapeutic exercise;Balance training;Neuromuscular re-education;Patient/family education    PT Goals (Current goals can be found in the Care Plan section)  Acute Rehab PT Goals Patient Stated Goal: Go home, feel better PT Goal Formulation: With patient Time For Goal Achievement: 07/03/24 Potential to Achieve Goals: Good    Frequency Min 2X/week     Co-evaluation               AM-PAC PT 6 Clicks Mobility  Outcome Measure Help needed turning from your back to your side while in a flat bed without using bedrails?: None Help needed moving from lying on your back to sitting on the side of a flat bed without using bedrails?: None Help needed moving to and from a bed to a chair (including a  wheelchair)?: A Little Help needed standing up from a chair using your arms (e.g., wheelchair or bedside chair)?: A Little Help needed to walk in hospital room?: A Little Help needed climbing 3-5 steps with a railing? : A Little 6 Click Score: 20    End of Session Equipment Utilized During Treatment: Oxygen  Activity Tolerance: Patient tolerated treatment well Patient left: in chair;with call bell/phone within reach Nurse Communication: Mobility status (sats) PT Visit Diagnosis: Unsteadiness on feet (R26.81);Other abnormalities of gait and mobility (R26.89);Muscle weakness (generalized) (M62.81);Difficulty in walking, not elsewhere classified (R26.2)    Time: 8779-8757 PT Time Calculation (min) (ACUTE ONLY): 22 min   Charges:   PT Evaluation $PT Eval Low Complexity: 1 Low   PT General Charges $$ ACUTE PT VISIT: 1 Visit         Leontine Roads, PT, DPT [facility]  Rehabilitation Services Physical Therapist Office: [phone] Website: [email or web address]   Leontine GORMAN Roads 06/19/2024, 1:42 PM

## 2024-06-19 NOTE — Progress Notes (Signed)
 PROGRESS NOTE     Patient Demographics:    [name] [name], is a 68 y.o. female, DOB - 1956/01/21, FMW:[id number]  Outpatient Primary MD for the patient is Vicci Barnie NOVAK, MD    LOS - 1  Admit date - 06/17/2024    Chief Complaint  Patient presents with   Shortness of Breath       Brief Narrative (HPI from H&P)    68 y.o. female with medical history significant of COPD, chronic hypoxic respiratory failure is about 3 L of nasal cannula oxygen  at home, chronic smoking cigarette, non-insulin -dependent DM type II, HFpEF, essential hypertension, diabetic polyneuropathy, prolonged QTc, anxiety, depression, GERD, hypothyroidism, hyperlipidemia and morbid obesity presented to emergency department complaining of shortness of breath, productive cough for 1 week.  Patient also reporting gain weight around 7 pounds in 1 week even though she has been compliant with Lasix .  Continue to smoke 8 to 10 cigarettes/day.  EMS provided DuoNeb and Solu-Medrol  prior to arrival with improvement of symptoms.    In the ER she was diagnosed with acute on chronic hypoxic respiratory failure due to COPD, rule out pneumonia and admitted to the hospital.    Subjective:    [name] [name] today has, No headache, No chest pain, No abdominal pain - No Nausea, No new weakness tingling or numbness, dry cough with expiratory wheezes and exertional shortness of breath   Assessment  & Plan :   Acute on chronic hypoxic respiratory failure due to COPD exacerbation and acute bronchitis.  Patient has underlying COPD, on chronic 3 L oxygen  with chronic hypoxic respiratory failure, still smoking cigarettes, has been placed on IV steroids, azithromycin , nebulizer treatments,  I-S and flutter valve, encouraged to sit in chair use I-S and flutter valve for pulmonary toiletry.  Advance activity and titrate down oxygen , follow cultures.    Community-acquired pneumonia and possible sepsis - Clinically ruled out, titrate down antibiotics, follow cultures, stable inflammatory markers   Chronic smoking cigarette - Continue nicotine  patch.  Counsel provided for smoking cessation   History of GERD  - Continue Protonix    History  of CAD s/p cardiac stent  - Continue aspirin , Lipitor , Nebivolol  and Brilinta    Chronic diastolic heart failure - Slight elevated BNP 103.  Recent echo EF 65 to 70% 11/30.  Hold IV fluids, IV Lasix  1 dose on 06/19/2024 and continue beta-blocker.   History of depression and anxiety  - Continue BuSpar , Lexapro , and Remeron .    Diabetic polyneuropathy  - Continue gabapentin    Hypothyroidism   - Continue levothyroxine   Non-insulin -dependent DM type II  - Hold metformin  in the setting of elevated lactic acid.  Continue sliding scale insulin  coverage with mealtime as on steroids.  Lab Results  Component Value Date   HGBA1C 6.5 (H) 06/12/2024   CBG (last 3)  Recent Labs    06/18/24 1633 06/18/24 2128 06/19/24 0755  GLUCAP 236* 159* 105*         Condition - Fair  Family Communication  :  None  Code Status :  Full  Consults  :  None  PUD Prophylaxis :  PPI   Procedures  :            Disposition Plan  :    Status is: Inpatient   DVT Prophylaxis  :    enoxaparin  (LOVENOX ) injection 40 mg Start: 06/18/24 1400 SCDs Start: 06/18/24 0149 Place TED hose Start: 06/18/24 0149    Lab Results  Component Value Date   PLT 416 (H) 06/19/2024    Diet :  Diet Order             Diet heart healthy/carb modified Room service appropriate? Yes; Fluid consistency: Thin  Diet effective now                    Inpatient Medications  Scheduled Meds:  aspirin  EC  81 mg Oral Daily   atorvastatin   80 mg Oral Daily    azithromycin   500 mg Oral Daily   budesonide -glycopyrrolate -formoterol   2 puff Inhalation BID   busPIRone   30 mg Oral Daily   And   busPIRone   15 mg Oral Q1200   And   busPIRone   15 mg Oral QPM   doxepin   75 mg Oral QHS   enoxaparin  (LOVENOX ) injection  40 mg Subcutaneous Q24H   escitalopram   20 mg Oral Daily   ezetimibe   10 mg Oral Daily   gabapentin   600 mg Oral BID   guaiFENesin   600 mg Oral BID   insulin  aspart  0-5 Units Subcutaneous QHS   insulin  aspart  0-9 Units Subcutaneous TID WC   levothyroxine   112 mcg Oral Q0600   methylPREDNISolone  (SOLU-MEDROL ) injection  60 mg Intravenous Q24H   mirtazapine   45 mg Oral QHS   montelukast   10 mg Oral QHS   nebivolol   10 mg Oral Daily   nicotine   14 mg Transdermal Daily   pantoprazole   40 mg Oral Daily   sodium chloride  flush  3 mL Intravenous Q12H   ticagrelor   60 mg Oral BID   Continuous Infusions:  sodium chloride      lactated ringers      PRN Meds:.acetaminophen  **OR** acetaminophen , benzonatate , clonazePAM , hydrOXYzine , ipratropium-albuterol , nitroGLYCERIN     Objective:   Vitals:   06/19/24 0200 06/19/24 0546 06/19/24 0800 06/19/24 0853  BP:  (!) 141/76 94/74   Pulse: 65  69   Resp: 17  17   Temp:  (!) 97.5 F (36.4 C) 98.9 F (37.2 C)   TempSrc:  Oral Oral   SpO2: 100%  98% 92%  Weight:  Height:        Wt Readings from Last 3 Encounters:  06/17/24 78.4 kg  06/17/24 78.5 kg  06/13/24 75.3 kg     Intake/Output Summary (Last 24 hours) at 06/19/2024 1004 Last data filed at 06/18/2024 2058 Gross per 24 hour  Intake 1578.44 ml  Output --  Net 1578.44 ml     Physical Exam  Awake Alert, No new F.N deficits, Normal affect [place].AT,PERRAL Supple Neck, No JVD,   Symmetrical Chest wall movement, moderate air movement bilaterally with expiratory wheezes RRR,No Gallops,Rubs or new Murmurs,  +ve B.Sounds, Abd Soft, No tenderness,   No Cyanosis, Clubbing or edema     Data Review:    Recent Labs  Lab  06/12/24 1509 06/12/24 1520 06/13/24 0431 06/18/24 0039 06/18/24 0500 06/19/24 0411  WBC 13.4*  --  13.5* 14.8* 13.9* 18.8*  HGB 11.1* 11.9* 11.8* 11.2* 10.7* 10.4*  HCT 36.6 35.0* 38.7 36.1 34.5* 33.8*  PLT 402*  --  418* 438* 450* 416*  MCV 94.3  --  93.3 90.9 91.3 92.9  MCH 28.6  --  28.4 28.2 28.3 28.6  MCHC 30.3  --  30.5 31.0 31.0 30.8  RDW 16.5*  --  16.5* 16.5* 16.6* 16.9*  LYMPHSABS 4.4*  --   --  0.5*  --  2.1  MONOABS 1.3*  --   --  0.2  --  1.5*  EOSABS 0.2  --   --  0.0  --  0.0  BASOSABS 0.1  --   --  0.0  --  0.0    Recent Labs  Lab 06/12/24 1509 06/12/24 1520 06/12/24 1528 06/13/24 0431 06/18/24 0039 06/18/24 0114 06/18/24 0500 06/18/24 0529 06/18/24 0532 06/18/24 0534 06/19/24 0411 06/19/24 0829  NA 141 140  --  142 141  --  141  --   --   --  140  --   K 4.5 4.3  --  4.3 4.0  --  4.5  --   --   --  4.5  --   CL 106  --   --  102 100  --  98  --   --   --  106  --   CO2 23  --   --  25 32  --  30  --   --   --  27  --   ANIONGAP 12  --   --  15 9  --  13  --   --   --  7  --   GLUCOSE 173*  --   --  137* 193*  --  200*  --   --   --  116*  --   BUN 7*  --   --  11 18  --  16  --   --   --  21  --   CREATININE 0.65  --   --  0.83 0.86  --  0.85  --   --   --  0.79  --   AST  --   --   --   --   --   --  18  --   --  17  --   --   ALT  --   --   --   --   --   --  23  --   --  22  --   --   ALKPHOS  --   --   --   --   --   --  55  --   --  54  --   --   BILITOT  --   --   --   --   --   --  0.4  --   --  0.4  --   --   ALBUMIN  --   --   --   --   --   --  2.8*  --   --  2.8*  --   --   CRP  --   --   --   --   --   --   --   --   --   --   --  <0.5  PROCALCITON  --   --   --   --   --   --   --   --   --  <0.10  --  <0.10  LATICACIDVEN  --   --   --   --   --  2.3*  --  2.3* 2.4*  --   --   --   HGBA1C 6.5*  --   --   --   --   --   --   --   --   --   --   --   BNP  --   --  81.1  --  103.4*  --   --   --   --   --   --   --   MG  --   --   --   2.3  --   --   --   --   --   --   --   --   PHOS  --   --   --  3.5  --   --   --   --   --   --   --   --   CALCIUM  8.5*  --   --  8.9 9.2  --  8.9  --   --   --  8.9  --       Recent Labs  Lab 06/12/24 1509 06/12/24 1528 06/13/24 0431 06/18/24 0039 06/18/24 0114 06/18/24 0500 06/18/24 0529 06/18/24 0532 06/18/24 0534 06/19/24 0411 06/19/24 0829  CRP  --   --   --   --   --   --   --   --   --   --  <0.5  PROCALCITON  --   --   --   --   --   --   --   --  <0.10  --  <0.10  LATICACIDVEN  --   --   --   --  2.3*  --  2.3* 2.4*  --   --   --   HGBA1C 6.5*  --   --   --   --   --   --   --   --   --   --   BNP  --  81.1  --  103.4*  --   --   --   --   --   --   --   MG  --   --  2.3  --   --   --   --   --   --   --   --   CALCIUM  8.5*  --  8.9 9.2  --  8.9  --   --   --  8.9  --     ---------------------------------------------------------------------------------------------------------------  Lab Results  Component Value Date   CHOL 145 12/26/2023   HDL 79 12/26/2023   LDLCALC 48 12/26/2023   TRIG 100 12/26/2023   CHOLHDL 1.8 12/26/2023    Lab Results  Component Value Date   HGBA1C 6.5 (H) 06/12/2024      Micro Results Recent Results (from the past 240 hours)  Resp panel by RT-PCR (RSV, Flu A&B, Covid) Anterior Nasal Swab     Status: None   Collection Time: 06/17/24 10:31 PM   Specimen: Anterior Nasal Swab  Result Value Ref Range Status   SARS Coronavirus 2 by RT PCR NEGATIVE NEGATIVE Final   Influenza A by PCR NEGATIVE NEGATIVE Final   Influenza B by PCR NEGATIVE NEGATIVE Final    Comment: (NOTE) The Xpert Xpress SARS-CoV-2/FLU/RSV plus assay is intended as an aid in the diagnosis of influenza from Nasopharyngeal swab specimens and should not be used as a sole basis for treatment. Nasal washings and aspirates are unacceptable for Xpert Xpress SARS-CoV-2/FLU/RSV testing.  Fact Sheet for Patients: bloggercourse.com  Fact Sheet for  Healthcare Providers: seriousbroker.it  This test is not yet approved or cleared by the [place]  FDA and has been authorized for detection and/or diagnosis of SARS-CoV-2 by FDA under an Emergency Use Authorization (EUA). This EUA will remain in effect (meaning this test can be used) for the duration of the COVID-19 declaration under Section 564(b)(1) of the Act, 21 U.S.C. section 360bbb-3(b)(1), unless the authorization is terminated or revoked.     Resp Syncytial Virus by PCR NEGATIVE NEGATIVE Final    Comment: (NOTE) Fact Sheet for Patients: bloggercourse.com  Fact Sheet for Healthcare Providers: seriousbroker.it  This test is not yet approved or cleared by the [place]  FDA and has been authorized for detection and/or diagnosis of SARS-CoV-2 by FDA under an Emergency Use Authorization (EUA). This EUA will remain in effect (meaning this test can be used) for the duration of the COVID-19 declaration under Section 564(b)(1) of the Act, 21 U.S.C. section 360bbb-3(b)(1), unless the authorization is terminated or revoked.  Performed at [facility], [address], [place], [place] [address]   Culture, blood (routine x 2)     Status: None (Preliminary result)   Collection Time: 06/18/24  1:48 AM   Specimen: BLOOD RIGHT FOREARM  Result Value Ref Range Status   Specimen Description BLOOD RIGHT FOREARM  Final   Special Requests   Final    BOTTLES DRAWN AEROBIC AND ANAEROBIC Blood Culture results may not be optimal due to an inadequate volume of blood received in culture bottles   Culture   Final    NO GROWTH < 12 HOURS Performed at [facility], [address] [place], [place] [address]    Report Status PENDING  Incomplete  Culture, blood (routine x 2)     Status: None (Preliminary result)   Collection Time: 06/18/24  1:49 AM   Specimen: BLOOD RIGHT HAND  Result Value Ref Range  Status   Specimen Description BLOOD RIGHT HAND  Final   Special Requests   Final    BOTTLES DRAWN AEROBIC AND ANAEROBIC Blood Culture adequate volume   Culture   Final    NO GROWTH < 12 HOURS Performed at [facility], [address], [place], [place] [address]    Report Status PENDING  Incomplete    Radiology Report DG Chest Port 1 View Result Date: 06/19/2024 EXAM: 1 VIEW(S) XRAY OF THE CHEST 06/19/2024 07:04:24 AM COMPARISON:  06/17/2024 CLINICAL HISTORY: SOB (shortness of breath) FINDINGS: LUNGS AND PLEURA: Mild streaky left basilar opacities, favor atelectasis. No pleural effusion. No pneumothorax. HEART AND MEDIASTINUM: Aortic atherosclerosis. No acute abnormality of the cardiac and mediastinal silhouettes. BONES AND SOFT TISSUES: Chronic left clavicle fracture. IMPRESSION: 1. Mild streaky left basilar opacities, favor atelectasis. Electronically signed by: Waddell Calk MD 06/19/2024 07:09 AM EST RP Workstation: [id number]   DG Chest 2 View Result Date: 06/17/2024 EXAM: 2 VIEW(S) XRAY OF THE CHEST 06/17/2024 11:07:00 PM COMPARISON: 06/12/2024 CLINICAL HISTORY: shob FINDINGS: LUNGS AND PLEURA: Left basilar airspace opacities, favored to represent atelectasis with infection not excluded. No pleural effusion or pneumothorax. HEART AND MEDIASTINUM: Atherosclerotic calcifications of aorta. BONES AND SOFT TISSUES: Multilevel degenerative changes of thoracic spine. Old left clavicular fracture. IMPRESSION: 1. Left basilar airspace opacities, favored to represent atelectasis with infection not excluded. Electronically signed by: Norman Gatlin MD 06/17/2024 11:14 PM EST RP Workstation: [id number]     Signature  -   Lavada Stank M.D on 06/19/2024 at 10:04 AM   -  To page go to [email or web address]

## 2024-06-20 LAB — CBC WITH DIFFERENTIAL/PLATELET
Abs Immature Granulocytes: 0.07 K/uL (ref 0.00–0.07)
Basophils Absolute: 0 K/uL (ref 0.0–0.1)
Basophils Relative: 0 %
Eosinophils Absolute: 0 K/uL (ref 0.0–0.5)
Eosinophils Relative: 0 %
HCT: 34 % — ABNORMAL LOW (ref 36.0–46.0)
Hemoglobin: 10.5 g/dL — ABNORMAL LOW (ref 12.0–15.0)
Immature Granulocytes: 1 %
Lymphocytes Relative: 17 %
Lymphs Abs: 2.4 K/uL (ref 0.7–4.0)
MCH: 28.5 pg (ref 26.0–34.0)
MCHC: 30.9 g/dL (ref 30.0–36.0)
MCV: 92.1 fL (ref 80.0–100.0)
Monocytes Absolute: 1.2 K/uL — ABNORMAL HIGH (ref 0.1–1.0)
Monocytes Relative: 9 %
Neutro Abs: 10.6 K/uL — ABNORMAL HIGH (ref 1.7–7.7)
Neutrophils Relative %: 73 %
Platelets: 387 K/uL (ref 150–400)
RBC: 3.69 MIL/uL — ABNORMAL LOW (ref 3.87–5.11)
RDW: 16.6 % — ABNORMAL HIGH (ref 11.5–15.5)
WBC: 14.3 K/uL — ABNORMAL HIGH (ref 4.0–10.5)
nRBC: 0 % (ref 0.0–0.2)

## 2024-06-20 LAB — BASIC METABOLIC PANEL WITH GFR
Anion gap: 8 (ref 5–15)
BUN: 19 mg/dL (ref 8–23)
CO2: 30 mmol/L (ref 22–32)
Calcium: 8.9 mg/dL (ref 8.9–10.3)
Chloride: 103 mmol/L (ref 98–111)
Creatinine, Ser: 0.75 mg/dL (ref 0.44–1.00)
GFR, Estimated: >60 mL/min (ref >60)
Glucose, Bld: 116 mg/dL — ABNORMAL HIGH (ref 70–99)
Potassium: 4.4 mmol/L (ref 3.5–5.1)
Sodium: 141 mmol/L (ref 135–145)

## 2024-06-20 LAB — GLUCOSE, CAPILLARY
Glucose-Capillary: 113 mg/dL — ABNORMAL HIGH (ref 70–99)
Glucose-Capillary: 114 mg/dL — ABNORMAL HIGH (ref 70–99)
Glucose-Capillary: 191 mg/dL — ABNORMAL HIGH (ref 70–99)
Glucose-Capillary: 177 mg/dL — ABNORMAL HIGH (ref 70–99)

## 2024-06-20 LAB — MAGNESIUM: Magnesium: 1.8 mg/dL (ref 1.7–2.4)

## 2024-06-20 LAB — C-REACTIVE PROTEIN: CRP: <0.5 mg/dL (ref <1.0)

## 2024-06-20 LAB — PROCALCITONIN: Procalcitonin: <0.1 ng/mL

## 2024-06-20 NOTE — Plan of Care (Signed)
  Problem: Coping: Goal: Ability to adjust to condition or change in health will improve Outcome: Progressing   Problem: Health Behavior/Discharge Planning: Goal: Ability to manage health-related needs will improve Outcome: Progressing   Problem: Metabolic: Goal: Ability to maintain appropriate glucose levels will improve Outcome: Progressing   Problem: Clinical Measurements: Goal: Ability to maintain clinical measurements within normal limits will improve Outcome: Progressing Goal: Respiratory complications will improve Outcome: Progressing

## 2024-06-20 NOTE — Progress Notes (Signed)
 PROGRESS NOTE     Patient Demographics:    [name] [name], is a 68 y.o. female, DOB - 01-Nov-1955, FMW:[id number]  Outpatient Primary MD for the patient is Vicci Barnie NOVAK, MD    LOS - 2  Admit date - 06/17/2024    Chief Complaint  Patient presents with   Shortness of Breath       Brief Narrative (HPI from H&P)    68 y.o. female with medical history significant of COPD, chronic hypoxic respiratory failure is about 3 L of nasal cannula oxygen  at home, chronic smoking cigarette, non-insulin -dependent DM type II, HFpEF, essential hypertension, diabetic polyneuropathy, prolonged QTc, anxiety, depression, GERD, hypothyroidism, hyperlipidemia and morbid obesity presented to emergency department complaining of shortness of breath, productive cough for 1 week.  Patient also reporting gain weight around 7 pounds in 1 week even though she has been compliant with Lasix .  Continue to smoke 8 to 10 cigarettes/day.  EMS provided DuoNeb and Solu-Medrol  prior to arrival with improvement of symptoms.    In the ER she was diagnosed with acute on chronic hypoxic respiratory failure due to COPD, rule out pneumonia and admitted to the hospital.    Subjective:   Patient in bed, appears comfortable, denies any headache, no fever, no chest pain or pressure, improved wheezing and shortness of breath , no abdominal pain. No new focal weakness.   Assessment  & Plan :   Acute on chronic hypoxic respiratory failure due to COPD exacerbation and acute bronchitis.  Patient has underlying COPD, on chronic 3 L oxygen  with chronic hypoxic respiratory failure, still smoking cigarettes, has been placed on IV steroids, azithromycin , nebulizer treatments, I-S and  flutter valve, encouraged to sit in chair use I-S and flutter valve for pulmonary toiletry.  Advance activity and titrate down oxygen , follow cultures.    Community-acquired pneumonia and possible sepsis - Clinically ruled out, titrate down antibiotics, follow cultures, stable inflammatory markers   Chronic smoking cigarette - Continue nicotine  patch.  Counsel provided for smoking cessation   History of GERD  - Continue Protonix    History of CAD s/p  cardiac stent  - Continue aspirin , Lipitor , Nebivolol  and Brilinta    Chronic diastolic heart failure - Slight elevated BNP 103.  Recent echo EF 65 to 70% 11/30.  Hold IV fluids, IV Lasix  1 dose on 06/19/2024 excellent response, if electrolytes and kidney function allow will repeat on 06/21/2019 and continue beta-blocker.   History of depression and anxiety  - Continue BuSpar , Lexapro , and Remeron .    Diabetic polyneuropathy  - Continue gabapentin    Hypothyroidism   - Continue levothyroxine   Non-insulin -dependent DM type II  - Hold metformin  in the setting of elevated lactic acid.  Continue sliding scale insulin  coverage with mealtime as on steroids.  Lab Results  Component Value Date   HGBA1C 6.5 (H) 06/12/2024   CBG (last 3)  Recent Labs    06/19/24 1554 06/19/24 2110 06/20/24 0725  GLUCAP 180* 228* 113*         Condition - Fair  Family Communication  :  None  Code Status :  Full  Consults  :  None  PUD Prophylaxis :  PPI   Procedures  :            Disposition Plan  :    Status is: Inpatient   DVT Prophylaxis  :    enoxaparin  (LOVENOX ) injection 40 mg Start: 06/18/24 1400 SCDs Start: 06/18/24 0149 Place TED hose Start: 06/18/24 0149    Lab Results  Component Value Date   PLT 387 06/20/2024    Diet :  Diet Order             Diet heart healthy/carb modified Room service appropriate? Yes; Fluid consistency: Thin  Diet effective now                    Inpatient Medications  Scheduled Meds:   aspirin  EC  81 mg Oral Daily   atorvastatin   80 mg Oral Daily   azithromycin   500 mg Oral Daily   budesonide -glycopyrrolate -formoterol   2 puff Inhalation BID   busPIRone   30 mg Oral Daily   And   busPIRone   15 mg Oral Q1200   And   busPIRone   15 mg Oral QPM   doxepin   75 mg Oral QHS   enoxaparin  (LOVENOX ) injection  40 mg Subcutaneous Q24H   escitalopram   20 mg Oral Daily   ezetimibe   10 mg Oral Daily   gabapentin   600 mg Oral BID   guaiFENesin   600 mg Oral BID   insulin  aspart  0-5 Units Subcutaneous QHS   insulin  aspart  0-9 Units Subcutaneous TID WC   levothyroxine   112 mcg Oral Q0600   methylPREDNISolone  (SOLU-MEDROL ) injection  60 mg Intravenous Q24H   mirtazapine   45 mg Oral QHS   montelukast   10 mg Oral QHS   nebivolol   10 mg Oral Daily   nicotine   14 mg Transdermal Daily   pantoprazole   40 mg Oral Daily   sodium chloride  flush  3 mL Intravenous Q12H   ticagrelor   60 mg Oral BID   Continuous Infusions:  lactated ringers      PRN Meds:.acetaminophen  **OR** acetaminophen , benzonatate , clonazePAM , hydrOXYzine , ipratropium-albuterol , nitroGLYCERIN     Objective:   Vitals:   06/19/24 2103 06/19/24 2349 06/20/24 0502 06/20/24 0727  BP:  (!) 146/64 (!) 146/66 (!) 153/93  Pulse:  66 70 68  Resp:  17 15 (!) 22  Temp:  98.3 F (36.8 C)  98.1 F (36.7 C)  TempSrc:  Oral  Oral  SpO2: 98% 97% 96% 95%  Weight:      Height:        Wt Readings from Last 3 Encounters:  06/17/24 78.4 kg  06/17/24 78.5 kg  06/13/24 75.3 kg     Intake/Output Summary (Last 24 hours) at 06/20/2024 1006 Last data filed at 06/19/2024 2100 Gross per 24 hour  Intake 2608.22 ml  Output 450 ml  Net 2158.22 ml     Physical Exam  Awake Alert, No new F.N deficits, Normal affect [place].AT,PERRAL Supple Neck, No JVD,   Symmetrical Chest wall movement, moderate air movement bilaterally with expiratory wheezes RRR,No Gallops,Rubs or new Murmurs,  +ve B.Sounds, Abd Soft, No tenderness,   No  Cyanosis, Clubbing or edema     Data Review:    Recent Labs  Lab 06/18/24 0039 06/18/24 0500 06/19/24 0411 06/20/24 0758  WBC 14.8* 13.9* 18.8* 14.3*  HGB 11.2* 10.7* 10.4* 10.5*  HCT 36.1 34.5* 33.8* 34.0*  PLT 438* 450* 416* 387  MCV 90.9 91.3 92.9 92.1  MCH 28.2 28.3 28.6 28.5  MCHC 31.0 31.0 30.8 30.9  RDW 16.5* 16.6* 16.9* 16.6*  LYMPHSABS 0.5*  --  2.1 2.4  MONOABS 0.2  --  1.5* 1.2*  EOSABS 0.0  --  0.0 0.0  BASOSABS 0.0  --  0.0 0.0    Recent Labs  Lab 06/18/24 0039 06/18/24 0114 06/18/24 0500 06/18/24 0529 06/18/24 0532 06/18/24 0534 06/19/24 0411 06/19/24 0829 06/20/24 0758  NA 141  --  141  --   --   --  140  --   --   K 4.0  --  4.5  --   --   --  4.5  --   --   CL 100  --  98  --   --   --  106  --   --   CO2 32  --  30  --   --   --  27  --   --   ANIONGAP 9  --  13  --   --   --  7  --   --   GLUCOSE 193*  --  200*  --   --   --  116*  --   --   BUN 18  --  16  --   --   --  21  --   --   CREATININE 0.86  --  0.85  --   --   --  0.79  --   --   AST  --   --  18  --   --  17  --   --   --   ALT  --   --  23  --   --  22  --   --   --   ALKPHOS  --   --  55  --   --  54  --   --   --   BILITOT  --   --  0.4  --   --  0.4  --   --   --   ALBUMIN  --   --  2.8*  --   --  2.8*  --   --   --   CRP  --   --   --   --   --   --   --  <0.5 <0.5  PROCALCITON  --   --   --   --   --  <0.10  --  <  0.10  --   LATICACIDVEN  --  2.3*  --  2.3* 2.4*  --   --   --   --   BNP 103.4*  --   --   --   --   --   --   --   --   CALCIUM  9.2  --  8.9  --   --   --  8.9  --   --       Recent Labs  Lab 06/18/24 0039 06/18/24 0114 06/18/24 0500 06/18/24 0529 06/18/24 0532 06/18/24 0534 06/19/24 0411 06/19/24 0829 06/20/24 0758  CRP  --   --   --   --   --   --   --  <0.5 <0.5  PROCALCITON  --   --   --   --   --  <0.10  --  <0.10  --   LATICACIDVEN  --  2.3*  --  2.3* 2.4*  --   --   --   --   BNP 103.4*  --   --   --   --   --   --   --   --   CALCIUM  9.2   --  8.9  --   --   --  8.9  --   --     --------------------------------------------------------------------------------------------------------------- Lab Results  Component Value Date   CHOL 145 12/26/2023   HDL 79 12/26/2023   LDLCALC 48 12/26/2023   TRIG 100 12/26/2023   CHOLHDL 1.8 12/26/2023    Lab Results  Component Value Date   HGBA1C 6.5 (H) 06/12/2024      Micro Results Recent Results (from the past 240 hours)  Resp panel by RT-PCR (RSV, Flu A&B, Covid) Anterior Nasal Swab     Status: None   Collection Time: 06/17/24 10:31 PM   Specimen: Anterior Nasal Swab  Result Value Ref Range Status   SARS Coronavirus 2 by RT PCR NEGATIVE NEGATIVE Final   Influenza A by PCR NEGATIVE NEGATIVE Final   Influenza B by PCR NEGATIVE NEGATIVE Final    Comment: (NOTE) The Xpert Xpress SARS-CoV-2/FLU/RSV plus assay is intended as an aid in the diagnosis of influenza from Nasopharyngeal swab specimens and should not be used as a sole basis for treatment. Nasal washings and aspirates are unacceptable for Xpert Xpress SARS-CoV-2/FLU/RSV testing.  Fact Sheet for Patients: bloggercourse.com  Fact Sheet for Healthcare Providers: seriousbroker.it  This test is not yet approved or cleared by the [place]  FDA and has been authorized for detection and/or diagnosis of SARS-CoV-2 by FDA under an Emergency Use Authorization (EUA). This EUA will remain in effect (meaning this test can be used) for the duration of the COVID-19 declaration under Section 564(b)(1) of the Act, 21 U.S.C. section 360bbb-3(b)(1), unless the authorization is terminated or revoked.     Resp Syncytial Virus by PCR NEGATIVE NEGATIVE Final    Comment: (NOTE) Fact Sheet for Patients: bloggercourse.com  Fact Sheet for Healthcare Providers: seriousbroker.it  This test is not yet approved or cleared by the [place]  [place] FDA and has been authorized for detection and/or diagnosis of SARS-CoV-2 by FDA under an Emergency Use Authorization (EUA). This EUA will remain in effect (meaning this test can be used) for the duration of the COVID-19 declaration under Section 564(b)(1) of the Act, 21 U.S.C. section 360bbb-3(b)(1), unless the authorization is terminated or revoked.  Performed at [facility], [address] [place], [place] [address]  Culture, blood (routine x 2)     Status: None (Preliminary result)   Collection Time: 06/18/24  1:48 AM   Specimen: BLOOD RIGHT FOREARM  Result Value Ref Range Status   Specimen Description BLOOD RIGHT FOREARM  Final   Special Requests   Final    BOTTLES DRAWN AEROBIC AND ANAEROBIC Blood Culture results may not be optimal due to an inadequate volume of blood received in culture bottles   Culture   Final    NO GROWTH 1 DAY Performed at [facility], [address], [place], [place] [address]    Report Status PENDING  Incomplete  Culture, blood (routine x 2)     Status: None (Preliminary result)   Collection Time: 06/18/24  1:49 AM   Specimen: BLOOD RIGHT HAND  Result Value Ref Range Status   Specimen Description BLOOD RIGHT HAND  Final   Special Requests   Final    BOTTLES DRAWN AEROBIC AND ANAEROBIC Blood Culture adequate volume   Culture   Final    NO GROWTH 1 DAY Performed at [facility], [address], [place], [place] [address]    Report Status PENDING  Incomplete    Radiology Report DG Chest Port 1 View Result Date: 06/19/2024 EXAM: 1 VIEW(S) XRAY OF THE CHEST 06/19/2024 07:04:24 AM COMPARISON: 06/17/2024 CLINICAL HISTORY: SOB (shortness of breath) FINDINGS: LUNGS AND PLEURA: Mild streaky left basilar opacities, favor atelectasis. No pleural effusion. No pneumothorax. HEART AND MEDIASTINUM: Aortic atherosclerosis. No acute abnormality of the cardiac and mediastinal silhouettes. BONES AND SOFT TISSUES: Chronic left clavicle  fracture. IMPRESSION: 1. Mild streaky left basilar opacities, favor atelectasis. Electronically signed by: Waddell Calk MD 06/19/2024 07:09 AM EST RP Workstation: [id number]     Signature  -   Lavada Stank M.D on 06/20/2024 at 10:06 AM   -  To page go to [email or web address]

## 2024-06-20 NOTE — Plan of Care (Signed)
  Problem: Education: Goal: Ability to describe self-care measures that may prevent or decrease complications (Diabetes Survival Skills Education) will improve Outcome: Progressing Goal: Individualized Educational Video(s) Outcome: Progressing   Problem: Coping: Goal: Ability to adjust to condition or change in health will improve Outcome: Progressing   Problem: Fluid Volume: Goal: Ability to maintain a balanced intake and output will improve Outcome: Progressing   Problem: Health Behavior/Discharge Planning: Goal: Ability to identify and utilize available resources and services will improve Outcome: Progressing Goal: Ability to manage health-related needs will improve Outcome: Progressing   Problem: Metabolic: Goal: Ability to maintain appropriate glucose levels will improve Outcome: Progressing   Problem: Nutritional: Goal: Maintenance of adequate nutrition will improve Outcome: Progressing Goal: Progress toward achieving an optimal weight will improve Outcome: Progressing   Problem: Skin Integrity: Goal: Risk for impaired skin integrity will decrease Outcome: Progressing   Problem: Tissue Perfusion: Goal: Adequacy of tissue perfusion will improve Outcome: Progressing   Problem: Education: Goal: Knowledge of General Education information will improve Description: Including pain rating scale, medication(s)/side effects and non-pharmacologic comfort measures Outcome: Progressing   Problem: Health Behavior/Discharge Planning: Goal: Ability to manage health-related needs will improve Outcome: Progressing   Problem: Clinical Measurements: Goal: Ability to maintain clinical measurements within normal limits will improve Outcome: Progressing Goal: Will remain free from infection Outcome: Progressing Goal: Diagnostic test results will improve Outcome: Progressing Goal: Respiratory complications will improve Outcome: Progressing Goal: Cardiovascular complication will  be avoided Outcome: Progressing   Problem: Activity: Goal: Risk for activity intolerance will decrease Outcome: Progressing   Problem: Nutrition: Goal: Adequate nutrition will be maintained Outcome: Progressing   Problem: Coping: Goal: Level of anxiety will decrease Outcome: Progressing   Problem: Elimination: Goal: Will not experience complications related to bowel motility Outcome: Progressing Goal: Will not experience complications related to urinary retention Outcome: Progressing   Problem: Pain Managment: Goal: General experience of comfort will improve and/or be controlled Outcome: Progressing   Problem: Safety: Goal: Ability to remain free from injury will improve Outcome: Progressing   Problem: Skin Integrity: Goal: Risk for impaired skin integrity will decrease Outcome: Progressing   Problem: Education: Goal: Knowledge of disease or condition will improve Outcome: Progressing Goal: Knowledge of the prescribed therapeutic regimen will improve Outcome: Progressing Goal: Individualized Educational Video(s) Outcome: Progressing   Problem: Activity: Goal: Ability to tolerate increased activity will improve Outcome: Progressing Goal: Will verbalize the importance of balancing activity with adequate rest periods Outcome: Progressing   Problem: Respiratory: Goal: Ability to maintain a clear airway will improve Outcome: Progressing Goal: Levels of oxygenation will improve Outcome: Progressing Goal: Ability to maintain adequate ventilation will improve Outcome: Progressing   Problem: Activity: Goal: Ability to tolerate increased activity will improve Outcome: Progressing   Problem: Clinical Measurements: Goal: Ability to maintain a body temperature in the normal range will improve Outcome: Progressing   Problem: Respiratory: Goal: Ability to maintain adequate ventilation will improve Outcome: Progressing Goal: Ability to maintain a clear airway will  improve Outcome: Progressing

## 2024-06-21 ENCOUNTER — Other Ambulatory Visit (HOSPITAL_COMMUNITY): Payer: Self-pay

## 2024-06-21 ENCOUNTER — Other Ambulatory Visit: Payer: Self-pay | Admitting: Physician Assistant

## 2024-06-21 ENCOUNTER — Other Ambulatory Visit: Payer: Self-pay

## 2024-06-21 ENCOUNTER — Telehealth (HOSPITAL_COMMUNITY): Payer: Self-pay

## 2024-06-21 ENCOUNTER — Ambulatory Visit: Admitting: Internal Medicine

## 2024-06-21 DIAGNOSIS — I2510 Atherosclerotic heart disease of native coronary artery without angina pectoris: Secondary | ICD-10-CM

## 2024-06-21 LAB — CBC WITH DIFFERENTIAL/PLATELET
Abs Immature Granulocytes: 0.1 K/uL — ABNORMAL HIGH (ref 0.00–0.07)
Basophils Absolute: 0 K/uL (ref 0.0–0.1)
Basophils Relative: 0 %
Eosinophils Absolute: 0 K/uL (ref 0.0–0.5)
Eosinophils Relative: 0 %
HCT: 36.8 % (ref 36.0–46.0)
Hemoglobin: 11.5 g/dL — ABNORMAL LOW (ref 12.0–15.0)
Immature Granulocytes: 1 %
Lymphocytes Relative: 15 %
Lymphs Abs: 2.3 K/uL (ref 0.7–4.0)
MCH: 28.5 pg (ref 26.0–34.0)
MCHC: 31.3 g/dL (ref 30.0–36.0)
MCV: 91.1 fL (ref 80.0–100.0)
Monocytes Absolute: 1.3 K/uL — ABNORMAL HIGH (ref 0.1–1.0)
Monocytes Relative: 9 %
Neutro Abs: 11.5 K/uL — ABNORMAL HIGH (ref 1.7–7.7)
Neutrophils Relative %: 75 %
Platelets: 431 K/uL — ABNORMAL HIGH (ref 150–400)
RBC: 4.04 MIL/uL (ref 3.87–5.11)
RDW: 16.6 % — ABNORMAL HIGH (ref 11.5–15.5)
WBC: 15.3 K/uL — ABNORMAL HIGH (ref 4.0–10.5)
nRBC: 0 % (ref 0.0–0.2)

## 2024-06-21 LAB — BASIC METABOLIC PANEL WITH GFR
Anion gap: 9 (ref 5–15)
BUN: 24 mg/dL — ABNORMAL HIGH (ref 8–23)
CO2: 34 mmol/L — ABNORMAL HIGH (ref 22–32)
Calcium: 9.3 mg/dL (ref 8.9–10.3)
Chloride: 100 mmol/L (ref 98–111)
Creatinine, Ser: 1.01 mg/dL — ABNORMAL HIGH (ref 0.44–1.00)
GFR, Estimated: >60 mL/min (ref >60)
Glucose, Bld: 122 mg/dL — ABNORMAL HIGH (ref 70–99)
Potassium: 4.4 mmol/L (ref 3.5–5.1)
Sodium: 143 mmol/L (ref 135–145)

## 2024-06-21 LAB — C-REACTIVE PROTEIN: CRP: 0.5 mg/dL (ref <1.0)

## 2024-06-21 LAB — PROCALCITONIN: Procalcitonin: <0.1 ng/mL

## 2024-06-21 LAB — GLUCOSE, CAPILLARY
Glucose-Capillary: 94 mg/dL (ref 70–99)
Glucose-Capillary: 91 mg/dL (ref 70–99)
Glucose-Capillary: 249 mg/dL — ABNORMAL HIGH (ref 70–99)
Glucose-Capillary: 255 mg/dL — ABNORMAL HIGH (ref 70–99)

## 2024-06-21 LAB — MAGNESIUM: Magnesium: 2 mg/dL (ref 1.7–2.4)

## 2024-06-21 MED ORDER — FUROSEMIDE 40 MG PO TABS
40.0000 mg | ORAL_TABLET | Freq: Once | ORAL | Status: AC
  Administered 2024-06-21: 40 mg via ORAL
  Filled 2024-06-21: qty 1

## 2024-06-21 MED ORDER — EZETIMIBE 10 MG PO TABS
10.0000 mg | ORAL_TABLET | Freq: Every day | ORAL | 3 refills | Status: AC
  Filled 2024-06-21: qty 90, 90d supply, fill #0

## 2024-06-21 NOTE — Telephone Encounter (Signed)
 Had referral for Pulmonary Rehab, patient is currently in the hospital.  Daughter [name] stated that we will need to call her to schedule because she is the one that will be taking her.  Advised that we would call her once she has been discharged.

## 2024-06-21 NOTE — Progress Notes (Signed)
 PROGRESS NOTE     Patient Demographics:    [name] [name], is a 68 y.o. female, DOB - 19-Jan-1956, FMW:[id number]  Outpatient Primary MD for the patient is Vicci Barnie NOVAK, MD    LOS - 3  Admit date - 06/17/2024    Chief Complaint  Patient presents with   Shortness of Breath       Brief Narrative (HPI from H&P)    68 y.o. female with medical history significant of COPD, chronic hypoxic respiratory failure is about 3 L of nasal cannula oxygen  at home, chronic smoking cigarette, non-insulin -dependent DM type II, HFpEF, essential hypertension, diabetic polyneuropathy, prolonged QTc, anxiety, depression, GERD, hypothyroidism, hyperlipidemia and morbid obesity presented to emergency department complaining of shortness of breath, productive cough for 1 week.  Patient also reporting gain weight around 7 pounds in 1 week even though she has been compliant with Lasix .  Continue to smoke 8 to 10 cigarettes/day.  EMS provided DuoNeb and Solu-Medrol  prior to arrival with improvement of symptoms.    In the ER she was diagnosed with acute on chronic hypoxic respiratory failure due to COPD, rule out pneumonia and admitted to the hospital.    Subjective:   Patient in bed, appears comfortable, denies any headache, no fever, no chest pain or pressure, no shortness of breath , no abdominal pain. No new focal weakness.   Assessment  & Plan :   Acute on chronic hypoxic respiratory failure due to COPD exacerbation and acute bronchitis.  Patient has underlying COPD, on chronic 3 L oxygen  with chronic hypoxic respiratory failure, still smoking cigarettes, has been placed on IV steroids, azithromycin , nebulizer treatments, I-S and flutter valve,  encouraged to sit in chair use I-S and flutter valve for pulmonary toiletry.  Advance activity and titrate down oxygen , follow cultures.    Community-acquired pneumonia and possible sepsis - Clinically ruled out, titrate down antibiotics, follow cultures, stable inflammatory markers   Chronic smoking cigarette - Continue nicotine  patch.  Counsel provided for smoking cessation   History of GERD  - Continue Protonix    History of CAD s/p cardiac stent  -  Continue aspirin , Lipitor , Nebivolol  and Brilinta    Chronic diastolic heart failure - Slight elevated BNP 103.  Recent echo EF 65 to 70% 11/30.  Hold IV fluids, IV Lasix  1 dose on 06/19/2024 excellent response, repeat x 1, if electrolytes and kidney function allow will repeat on 06/21/2019 and continue beta-blocker.   History of depression and anxiety  - Continue BuSpar , Lexapro , and Remeron .    Diabetic polyneuropathy  - Continue gabapentin    Hypothyroidism   - Continue levothyroxine   Non-insulin -dependent DM type II  - Hold metformin  in the setting of elevated lactic acid.  Continue sliding scale insulin  coverage with mealtime as on steroids.  Lab Results  Component Value Date   HGBA1C 6.5 (H) 06/12/2024   CBG (last 3)  Recent Labs    06/20/24 1547 06/20/24 2104 06/21/24 0746  GLUCAP 191* 177* 94         Condition - Fair  Family Communication  :  None  Code Status :  Full  Consults  :  None  PUD Prophylaxis :  PPI   Procedures  :            Disposition Plan  :    Status is: Inpatient   DVT Prophylaxis  :    enoxaparin  (LOVENOX ) injection 40 mg Start: 06/18/24 1400 SCDs Start: 06/18/24 0149 Place TED hose Start: 06/18/24 0149    Lab Results  Component Value Date   PLT 431 (H) 06/21/2024    Diet :  Diet Order             Diet heart healthy/carb modified Room service appropriate? Yes; Fluid consistency: Thin  Diet effective now                    Inpatient Medications  Scheduled Meds:   aspirin  EC  81 mg Oral Daily   atorvastatin   80 mg Oral Daily   azithromycin   500 mg Oral Daily   budesonide -glycopyrrolate -formoterol   2 puff Inhalation BID   busPIRone   30 mg Oral Daily   And   busPIRone   15 mg Oral Q1200   And   busPIRone   15 mg Oral QPM   doxepin   75 mg Oral QHS   enoxaparin  (LOVENOX ) injection  40 mg Subcutaneous Q24H   escitalopram   20 mg Oral Daily   ezetimibe   10 mg Oral Daily   furosemide   40 mg Oral Once   gabapentin   600 mg Oral BID   guaiFENesin   600 mg Oral BID   insulin  aspart  0-5 Units Subcutaneous QHS   insulin  aspart  0-9 Units Subcutaneous TID WC   levothyroxine   112 mcg Oral Q0600   methylPREDNISolone  (SOLU-MEDROL ) injection  60 mg Intravenous Q24H   mirtazapine   45 mg Oral QHS   montelukast   10 mg Oral QHS   nebivolol   10 mg Oral Daily   nicotine   14 mg Transdermal Daily   pantoprazole   40 mg Oral Daily   sodium chloride  flush  3 mL Intravenous Q12H   ticagrelor   60 mg Oral BID   Continuous Infusions:  lactated ringers      PRN Meds:.acetaminophen  **OR** acetaminophen , benzonatate , clonazePAM , hydrOXYzine , ipratropium-albuterol , nitroGLYCERIN     Objective:   Vitals:   06/20/24 2035 06/20/24 2102 06/21/24 0621 06/21/24 0747  BP:  (!) 149/67 (!) 157/75 (!) 178/75  Pulse: 63 71 (!) 57 61  Resp: 20  18 16   Temp:  98.5 F (36.9 C) 97.9 F (36.6 C) 98.3 F (36.8 C)  TempSrc:  Oral Oral Oral  SpO2: 99%   100%  Weight:      Height:        Wt Readings from Last 3 Encounters:  06/17/24 78.4 kg  06/17/24 78.5 kg  06/13/24 75.3 kg     Intake/Output Summary (Last 24 hours) at 06/21/2024 0829 Last data filed at 06/20/2024 1000 Gross per 24 hour  Intake 420 ml  Output --  Net 420 ml     Physical Exam  Awake Alert, No new F.N deficits, Normal affect [place].AT,PERRAL Supple Neck, No JVD,   Symmetrical Chest wall movement, moderate air movement bilaterally with expiratory wheezes RRR,No Gallops,Rubs or new Murmurs,  +ve B.Sounds,  Abd Soft, No tenderness,   No Cyanosis, Clubbing or edema     Data Review:    Recent Labs  Lab 06/18/24 0039 06/18/24 0500 06/19/24 0411 06/20/24 0758 06/21/24 0300  WBC 14.8* 13.9* 18.8* 14.3* 15.3*  HGB 11.2* 10.7* 10.4* 10.5* 11.5*  HCT 36.1 34.5* 33.8* 34.0* 36.8  PLT 438* 450* 416* 387 431*  MCV 90.9 91.3 92.9 92.1 91.1  MCH 28.2 28.3 28.6 28.5 28.5  MCHC 31.0 31.0 30.8 30.9 31.3  RDW 16.5* 16.6* 16.9* 16.6* 16.6*  LYMPHSABS 0.5*  --  2.1 2.4 2.3  MONOABS 0.2  --  1.5* 1.2* 1.3*  EOSABS 0.0  --  0.0 0.0 0.0  BASOSABS 0.0  --  0.0 0.0 0.0    Recent Labs  Lab 06/18/24 0039 06/18/24 0114 06/18/24 0500 06/18/24 0529 06/18/24 0532 06/18/24 0534 06/19/24 0411 06/19/24 0829 06/20/24 0758 06/21/24 0300  NA 141  --  141  --   --   --  140  --  141 143  K 4.0  --  4.5  --   --   --  4.5  --  4.4 4.4  CL 100  --  98  --   --   --  106  --  103 100  CO2 32  --  30  --   --   --  27  --  30 34*  ANIONGAP 9  --  13  --   --   --  7  --  8 9  GLUCOSE 193*  --  200*  --   --   --  116*  --  116* 122*  BUN 18  --  16  --   --   --  21  --  19 24*  CREATININE 0.86  --  0.85  --   --   --  0.79  --  0.75 1.01*  AST  --   --  18  --   --  17  --   --   --   --   ALT  --   --  23  --   --  22  --   --   --   --   ALKPHOS  --   --  55  --   --  54  --   --   --   --   BILITOT  --   --  0.4  --   --  0.4  --   --   --   --   ALBUMIN  --   --  2.8*  --   --  2.8*  --   --   --   --   CRP  --   --   --   --   --   --   --  <  0.5 <0.5 0.5  PROCALCITON  --   --   --   --   --  <0.10  --  <0.10 <0.10 <0.10  LATICACIDVEN  --  2.3*  --  2.3* 2.4*  --   --   --   --   --   BNP 103.4*  --   --   --   --   --   --   --   --   --   MG  --   --   --   --   --   --   --   --  1.8 2.0  CALCIUM  9.2  --  8.9  --   --   --  8.9  --  8.9 9.3      Recent Labs  Lab 06/18/24 0039 06/18/24 0114 06/18/24 0500 06/18/24 0529 06/18/24 0532 06/18/24 0534 06/19/24 0411 06/19/24 0829  06/20/24 0758 06/21/24 0300  CRP  --   --   --   --   --   --   --  <0.5 <0.5 0.5  PROCALCITON  --   --   --   --   --  <0.10  --  <0.10 <0.10 <0.10  LATICACIDVEN  --  2.3*  --  2.3* 2.4*  --   --   --   --   --   BNP 103.4*  --   --   --   --   --   --   --   --   --   MG  --   --   --   --   --   --   --   --  1.8 2.0  CALCIUM  9.2  --  8.9  --   --   --  8.9  --  8.9 9.3    --------------------------------------------------------------------------------------------------------------- Lab Results  Component Value Date   CHOL 145 12/26/2023   HDL 79 12/26/2023   LDLCALC 48 12/26/2023   TRIG 100 12/26/2023   CHOLHDL 1.8 12/26/2023    Lab Results  Component Value Date   HGBA1C 6.5 (H) 06/12/2024      Micro Results Recent Results (from the past 240 hours)  Resp panel by RT-PCR (RSV, Flu A&B, Covid) Anterior Nasal Swab     Status: None   Collection Time: 06/17/24 10:31 PM   Specimen: Anterior Nasal Swab  Result Value Ref Range Status   SARS Coronavirus 2 by RT PCR NEGATIVE NEGATIVE Final   Influenza A by PCR NEGATIVE NEGATIVE Final   Influenza B by PCR NEGATIVE NEGATIVE Final    Comment: (NOTE) The Xpert Xpress SARS-CoV-2/FLU/RSV plus assay is intended as an aid in the diagnosis of influenza from Nasopharyngeal swab specimens and should not be used as a sole basis for treatment. Nasal washings and aspirates are unacceptable for Xpert Xpress SARS-CoV-2/FLU/RSV testing.  Fact Sheet for Patients: bloggercourse.com  Fact Sheet for Healthcare Providers: seriousbroker.it  This test is not yet approved or cleared by the [place]  FDA and has been authorized for detection and/or diagnosis of SARS-CoV-2 by FDA under an Emergency Use Authorization (EUA). This EUA will remain in effect (meaning this test can be used) for the duration of the COVID-19 declaration under Section 564(b)(1) of the Act, 21 U.S.C. section  360bbb-3(b)(1), unless the authorization is terminated or revoked.     Resp Syncytial Virus by PCR NEGATIVE NEGATIVE Final    Comment: (NOTE) Fact Sheet for Patients: bloggercourse.com  Fact Sheet for Healthcare Providers: seriousbroker.it  This test is not yet approved or cleared by the [place]  FDA and has been authorized for detection and/or diagnosis of SARS-CoV-2 by FDA under an Emergency Use Authorization (EUA). This EUA will remain in effect (meaning this test can be used) for the duration of the COVID-19 declaration under Section 564(b)(1) of the Act, 21 U.S.C. section 360bbb-3(b)(1), unless the authorization is terminated or revoked.  Performed at [facility], [address], [place], [place] [address]   Culture, blood (routine x 2)     Status: None (Preliminary result)   Collection Time: 06/18/24  1:48 AM   Specimen: BLOOD RIGHT FOREARM  Result Value Ref Range Status   Specimen Description BLOOD RIGHT FOREARM  Final   Special Requests   Final    BOTTLES DRAWN AEROBIC AND ANAEROBIC Blood Culture results may not be optimal due to an inadequate volume of blood received in culture bottles   Culture   Final    NO GROWTH 3 DAYS Performed at [facility], [address], [place], [place] [address]    Report Status PENDING  Incomplete  Culture, blood (routine x 2)     Status: None (Preliminary result)   Collection Time: 06/18/24  1:49 AM   Specimen: BLOOD RIGHT HAND  Result Value Ref Range Status   Specimen Description BLOOD RIGHT HAND  Final   Special Requests   Final    BOTTLES DRAWN AEROBIC AND ANAEROBIC Blood Culture adequate volume   Culture   Final    NO GROWTH 3 DAYS Performed at [facility], [address] [place], [place] [address]    Report Status PENDING  Incomplete    Radiology Report No results found.    Signature  -   Lavada Stank M.D on 06/21/2024 at 8:29 AM   -  To page  go to [email or web address]

## 2024-06-21 NOTE — TOC Initial Note (Addendum)
 Transition of Care [facility]) - Initial/Assessment Note    Patient Details  Name: [name] [name] MRN: [id number] Date of Birth: Jun 05, 1956  [facility]) CM/SW Contact:    [name] DELENA Senters, RN Phone Number: 06/21/2024, 2:45 PM  Clinical Narrative:                 [id number] history significant of COPD, chronic hypoxic respiratory failure, chronic smoking cigarette, non-insulin -dependent DM type II, HFpEF, essential hypertension, diabetic polyneuropathy, prolonged QTc, anxiety, depression, GERD, hypothyroidism, hyperlipidemia and morbid obesity presented to emergency department complaining of shortness of breath, productive cough for 1 week.   Patient lives alone, reports having her daughter for support at home. Daughter helps with housework, transportation needs, and manages routine medications for patient. Daughter will transport home at d/c.   Patient has PCP, DME reviewed, wears home oxygen  3-4L O2 through [employer]. Patient is set up with transportation through [name] [name] when needed.   Therapy recommendation for outpatient pulmonary rehab. Per patient request, referral sent to [facility] pulmonary rehab at [facility]. Info on AVS.  CM will continue to follow.    Expected Discharge Plan:  (TBD) Barriers to Discharge: Continued Medical Work up   Patient Goals and CMS Choice   CMS Medicare.gov Compare Post Acute Care list provided to:: Patient Choice offered to / list presented to : Patient      Expected Discharge Plan and Services       Living arrangements for the past 2 months: Single Family Home                                      Prior Living Arrangements/Services Living arrangements for the past 2 months: Single Family Home Lives with:: Self Patient language and need for interpreter reviewed:: Yes Do you feel safe going back to the place where you live?: Yes      Need for Family Participation in Patient Care: Yes (Comment) Care giver support system in place?: Yes  (comment) Current home services: DME (cane, rollator, shower bench, 3-4L home O2 through [employer], nebulizer) Criminal Activity/Legal Involvement Pertinent to Current Situation/Hospitalization: No - Comment as needed  Activities of Daily Living   ADL Screening (condition at time of admission) Independently performs ADLs?: Yes (appropriate for developmental age) Is the patient deaf or have difficulty hearing?: Yes Does the patient have difficulty seeing, even when wearing glasses/contacts?: Yes Does the patient have difficulty concentrating, remembering, or making decisions?: No  Permission Sought/Granted                  Emotional Assessment Appearance:: Developmentally appropriate Attitude/Demeanor/Rapport: Engaged Affect (typically observed): Calm Orientation: : Oriented to Self, Oriented to Place, Oriented to  Time, Oriented to Situation Alcohol / Substance Use: Not Applicable Psych Involvement: No (comment)  Admission diagnosis:  Shortness of breath [R06.02] COPD with acute exacerbation (HCC) [J44.1] Pneumonia of left lower lobe due to infectious organism [J18.9] Chronic obstructive pulmonary disease, unspecified COPD type (HCC) [J44.9] Patient Active Problem List   Diagnosis Date Noted   (HFpEF) heart failure with preserved ejection fraction (HCC) 06/18/2024   Non-insulin  dependent type 2 diabetes mellitus (HCC) 06/18/2024   History of depression 06/18/2024   Diabetic neuropathy (HCC) 06/18/2024   Hypokalemia 01/08/2024   Acute on chronic heart failure with preserved ejection fraction (HFpEF, >= 50%) (HCC) 12/05/2023   At risk for polypharmacy 12/04/2023   Long-term current use of  antidepressant 11/17/2023   Long-term current use of benzodiazepine 11/17/2023   Sigmoid diverticulitis 11/12/2023   Acute kidney injury superimposed on chronic kidney disease 11/12/2023   CKD (chronic kidney disease) stage 2, GFR 60-89 ml/min 11/12/2023   Sepsis due to pneumonia (HCC)  11/12/2023   Acute on chronic hypoxic respiratory failure (HCC) 10/05/2023   Pneumothorax, right 09/16/2023   COPD with acute exacerbation (HCC) 09/01/2023   Chronic hypoxic respiratory failure (HCC) 09/01/2023   Continuous dependence on cigarette smoking 09/01/2023   History of CAD (coronary artery disease) 09/01/2023   CAP (community acquired pneumonia) 09/01/2023   Full code status 06/14/2023   Acute hypoxic respiratory failure (HCC) 06/13/2023   Acute exacerbation of chronic obstructive pulmonary disease (COPD) (HCC) 06/12/2023   Nicotine  dependence 05/03/2023   Substance abuse (HCC) 09/10/2022   Iron  deficiency anemia 11/02/2021   Major depressive disorder, recurrent, in full remission with anxious distress 05/31/2021   Chronic neck pain 02/26/2021   GAD (generalized anxiety disorder) 01/08/2021   Esophageal dysphagia 04/28/2019   Bipolar disorder (HCC) 09/17/2018   Insomnia 10/28/2017   Prediabetes 05/28/2017   QT prolongation 03/25/2017   Psoriasis 06/22/2015   COPD (chronic obstructive pulmonary disease) (HCC)GOLD E 05/18/2015   Anxiety and depression 07/10/2013   Essential hypertension 06/07/2013   Hyperlipidemia 06/07/2013   Asthma, chronic 06/07/2013   Emphysema lung (HCC) 06/07/2013   Chronic back pain 06/07/2013   CAD S/P percutaneous coronary angioplasty 06/07/2013   GERD (gastroesophageal reflux disease) 06/07/2013   Breast lump on left side at 3 o'clock position 10/13/2012   S/P abdominal hysterectomy and right salpingo-oophorectomy 08/29/2011   COPD exacerbation (HCC) 09/15/2009   TOBACCO ABUSE 07/17/2009   Chronic rhinitis 07/17/2009   Lung nodule < 6cm on CT 07/17/2009   Hypothyroidism 12/22/2008   Chronic depression 12/22/2008   PCP:  [name] Barnie NOVAK, MD Pharmacy:   [name] - [facility] [address] [place] [address] Phone: [phone] Fax: [phone]  [facility] - [place], [place] - [name] [place] [address] Phone: [phone] Fax: [phone]     Social Drivers of Health (SDOH) Social History: SDOH Screenings   Food Insecurity: Food Insecurity Present (06/18/2024)  Housing: Low Risk  (06/18/2024)  Transportation Needs: No Transportation Needs (06/18/2024)  Utilities: Not At Risk (06/18/2024)  Alcohol Screen: Low Risk  (06/18/2024)  Depression (PHQ2-9): Medium Risk (05/18/2024)  Financial Resource Strain: Medium Risk (06/18/2024)  Physical Activity: Inactive (06/18/2024)  Social Connections: Moderately Isolated (06/18/2024)  Stress: No Stress Concern Present (06/18/2024)  Tobacco Use: High Risk (06/17/2024)  Health Literacy: Inadequate Health Literacy (08/07/2023)   SDOH Interventions:     Readmission Risk Interventions    06/20/2023    3:17 PM  Readmission Risk Prevention Plan  Transportation Screening Complete  HRI or Home Care Consult Complete  Social Work Consult for Recovery Care Planning/Counseling Complete  Palliative Care Screening Not Applicable  Medication Review Oceanographer) Complete

## 2024-06-22 ENCOUNTER — Ambulatory Visit

## 2024-06-22 ENCOUNTER — Ambulatory Visit: Admitting: Pulmonary Disease

## 2024-06-22 ENCOUNTER — Other Ambulatory Visit (HOSPITAL_COMMUNITY): Payer: Self-pay

## 2024-06-22 ENCOUNTER — Encounter: Payer: Self-pay | Admitting: Hematology

## 2024-06-22 LAB — GLUCOSE, CAPILLARY: Glucose-Capillary: 146 mg/dL — ABNORMAL HIGH (ref 70–99)

## 2024-06-22 MED ORDER — NICOTINE 21 MG/24HR TD PT24
21.0000 mg | MEDICATED_PATCH | Freq: Every day | TRANSDERMAL | 0 refills | Status: DC
  Filled 2024-06-22: qty 28, 28d supply, fill #0

## 2024-06-22 MED ORDER — DOXYCYCLINE HYCLATE 100 MG PO TABS
100.0000 mg | ORAL_TABLET | Freq: Two times a day (BID) | ORAL | 0 refills | Status: DC
  Filled 2024-06-22: qty 6, 3d supply, fill #0

## 2024-06-22 MED ORDER — MAGNESIUM SULFATE 2 GM/50ML IV SOLN: 2.0000 g | Freq: Once | INTRAVENOUS | Status: DC

## 2024-06-22 MED ORDER — BENZONATATE 100 MG PO CAPS
100.0000 mg | ORAL_CAPSULE | Freq: Three times a day (TID) | ORAL | 0 refills | Status: DC | PRN
  Filled 2024-06-22: qty 20, 7d supply, fill #0

## 2024-06-22 MED ORDER — METHYLPREDNISOLONE SODIUM SUCC 40 MG IJ SOLR
40.0000 mg | INTRAMUSCULAR | Status: DC
  Filled 2024-06-22: qty 1

## 2024-06-22 MED ORDER — AZITHROMYCIN 500 MG PO TABS
500.0000 mg | ORAL_TABLET | Freq: Every day | ORAL | 0 refills | Status: DC
  Filled 2024-06-22: qty 2, 2d supply, fill #0

## 2024-06-22 MED ORDER — DOXYCYCLINE HYCLATE 100 MG PO TABS
100.0000 mg | ORAL_TABLET | Freq: Two times a day (BID) | ORAL | Status: DC
  Administered 2024-06-22: 100 mg via ORAL
  Filled 2024-06-22: qty 1

## 2024-06-22 MED ORDER — MAGNESIUM SULFATE 2 GM/50ML IV SOLN
INTRAVENOUS | Status: AC
  Administered 2024-06-22: 2 g via INTRAVENOUS
  Filled 2024-06-22: qty 50

## 2024-06-22 MED ORDER — PREDNISONE 5 MG PO TABS
ORAL_TABLET | ORAL | 0 refills | Status: AC
  Filled 2024-06-22: qty 65, 18d supply, fill #0

## 2024-06-22 NOTE — Plan of Care (Signed)
  Problem: Coping: Goal: Ability to adjust to condition or change in health will improve Outcome: Progressing   Problem: Health Behavior/Discharge Planning: Goal: Ability to manage health-related needs will improve Outcome: Progressing   Problem: Metabolic: Goal: Ability to maintain appropriate glucose levels will improve Outcome: Progressing   Problem: Clinical Measurements: Goal: Respiratory complications will improve Patient to get back to baseline o2 flow rate. Outcome: Progressing   Problem: Activity: Goal: Risk for activity intolerance will decrease Outcome: Progressing

## 2024-06-22 NOTE — Discharge Instructions (Signed)
 Follow with Primary MD Vicci Barnie NOVAK, MD in 7 days   Get CBC, CMP, Magnesium , 2 view Chest X ray -  checked next visit with your primary MD   Activity: As tolerated with Full fall precautions use walker/cane & assistance as needed  Disposition Home    Diet: Heart Healthy low carbohydrate, check CBGs q. ACHS  Special Instructions: If you have smoked or chewed Tobacco  in the last 2 yrs please stop smoking, stop any regular Alcohol  and or any Recreational drug use.  On your next visit with your primary care physician please Get Medicines reviewed and adjusted.  Please request your Prim.MD to go over all Hospital Tests and Procedure/Radiological results at the follow up, please get all Hospital records sent to your Prim MD by signing hospital release before you go home.  If you experience worsening of your admission symptoms, develop shortness of breath, life threatening emergency, suicidal or homicidal thoughts you must seek medical attention immediately by calling 911 or calling your MD immediately  if symptoms less severe.  You Must read complete instructions/literature along with all the possible adverse reactions/side effects for all the Medicines you take and that have been prescribed to you. Take any new Medicines after you have completely understood and accpet all the possible adverse reactions/side effects.   Do not drive when taking Pain medications.  Do not take more than prescribed Pain, Sleep and Anxiety Medications  Wear Seat belts while driving.

## 2024-06-22 NOTE — Discharge Summary (Addendum)
 Discharge summary note.  [name] [name] FMW:[id number] DOB: [birth year] DOA: 06/17/2024  PCP: Vicci Barnie [name], MD  Admit date: 06/17/2024  Discharge date: 06/22/2024  Admitted From: Home   Disposition:  Home   Recommendations for Outpatient Follow-up:   Follow up with PCP in 1-2 weeks  PCP Please obtain BMP/CBC, 2 view CXR in 1week,  (see Discharge instructions)   PCP Please follow up on the following pending results:    Home Health: None   Equipment/Devices: None  Consultations: None  Discharge Condition: Stable    CODE STATUS: Full    Diet Recommendation: Heart Healthy     Chief Complaint  Patient presents with   Shortness of Breath     Brief history of present illness from the day of admission and additional interim summary    68 y.o. female with medical history significant of COPD, chronic hypoxic respiratory failure is about 3 L of nasal cannula oxygen  at home, chronic smoking cigarette, non-insulin -dependent DM type II, HFpEF, essential hypertension, diabetic polyneuropathy, prolonged QTc, anxiety, depression, GERD, hypothyroidism, hyperlipidemia and morbid obesity presented to emergency department complaining of shortness of breath, productive cough for 1 week.  Patient also reporting gain weight around 7 pounds in 1 week even though she has been compliant with Lasix .  Continue to smoke 8 to 10 cigarettes/day.  EMS provided DuoNeb and Solu-Medrol  prior to arrival with improvement of symptoms.     In the ER she was diagnosed with acute on chronic hypoxic respiratory failure due to COPD, rule out pneumonia and admitted to the hospital.                                                                 Hospital Course    Acute on chronic hypoxic respiratory failure due to COPD exacerbation and acute  bronchitis.  Patient has underlying COPD, on chronic 3 L oxygen  with chronic hypoxic respiratory failure, still smoking cigarettes, he was placed on IV steroids, azithromycin , nebulizer treatments, I-S and flutter valve, encouraged to sit in chair use I-S and flutter valve for pulmonary toiletry.  Abortive care significantly improved close to her baseline now using 3 L nasal cannula oxygen  at rest which she uses at home, will be discharged home on oral steroid taper, few more days of oral Doxy, strictly counseled to abstain from smoking, follow-up with PCP within a week and her pulmonologist in 1 to 2 weeks.    Community-acquired pneumonia and possible sepsis - Clinically ruled out, titrate down antibiotics, follow cultures, stable inflammatory markers, toes to her baseline few more days of oral antibiotics due to above.   Chronic smoking cigarette - Continue nicotine  patch.  Counsel provided for smoking cessation   History of GERD  - Continue Protonix    History of CAD s/p cardiac  stent  - Continue aspirin , Lipitor , Nebivolol  and Brilinta    Mild acute on chronic chronic diastolic heart failure - Slight elevated BNP 103.  Recent echo EF around 65%, post few doses of Lasix  completely resolved, will continue beta-blocker.  PCP to monitor   History of depression and anxiety  - Continue BuSpar , Lexapro , and Remeron .    Diabetic polyneuropathy  - Continue gabapentin    Hypothyroidism   - Continue levothyroxine    Non-insulin -dependent DM type II  -continue home regimen follow-up with PCP   Postdischarge patient had a run of artifact 25 beats on telemetry which looked like V. Tach x 2 , which time patient was out of the bed and moving, once brushing her teeth and other time walking to the toilet, completely asymptomatic, stable EKG, reviewed telemetry with cardiologist Dr. Reeta and Barnetta both agree this is artifact.  Patient was asymptomatic, EF preserved on beta-blocker, stable  electrolytes  Discharge diagnosis     Active Problems:   COPD with acute exacerbation (HCC)   Acute on chronic hypoxic respiratory failure (HCC)   Sepsis due to pneumonia (HCC)   Hypothyroidism   CAD S/P percutaneous coronary angioplasty   GERD (gastroesophageal reflux disease)   GAD (generalized anxiety disorder)   Continuous dependence on cigarette smoking   (HFpEF) heart failure with preserved ejection fraction (HCC)   Non-insulin  dependent type 2 diabetes mellitus (HCC)   History of depression   Diabetic neuropathy (HCC)    Discharge instructions    Discharge Instructions     AMB referral to pulmonary rehabilitation   Complete by: As directed    Please select a program: Pulmonary Rehabilitation   Diagnosis: (See process instructions below for COPD requirements): Emphysema   After initial evaluation and assessments completed: Virtual Based Care may be provided alone or in conjunction with Pulmonary Rehab/Respiratory Care services based on patient barriers.: Yes   Discharge instructions   Complete by: As directed    Follow with Primary MD Vicci Barnie NOVAK, MD in 7 days   Get CBC, CMP, Magnesium , 2 view Chest X ray -  checked next visit with your primary MD   Activity: As tolerated with Full fall precautions use walker/cane & assistance as needed  Disposition Home    Diet: Heart Healthy low carbohydrate, check CBGs q. ACHS  Special Instructions: If you have smoked or chewed Tobacco  in the last 2 yrs please stop smoking, stop any regular Alcohol  and or any Recreational drug use.  On your next visit with your primary care physician please Get Medicines reviewed and adjusted.  Please request your Prim.MD to go over all Hospital Tests and Procedure/Radiological results at the follow up, please get all Hospital records sent to your Prim MD by signing hospital release before you go home.  If you experience worsening of your admission symptoms, develop shortness of  breath, life threatening emergency, suicidal or homicidal thoughts you must seek medical attention immediately by calling 911 or calling your MD immediately  if symptoms less severe.  You Must read complete instructions/literature along with all the possible adverse reactions/side effects for all the Medicines you take and that have been prescribed to you. Take any new Medicines after you have completely understood and accpet all the possible adverse reactions/side effects.   Do not drive when taking Pain medications.  Do not take more than prescribed Pain, Sleep and Anxiety Medications  Wear Seat belts while driving.   Increase activity slowly   Complete by: As directed  Discharge Medications   Allergies as of 06/22/2024       Reactions   Avocado Anaphylaxis   Latex Shortness Of Breath, Rash   Codeine  Nausea Only        Medication List     STOP taking these medications    azithromycin  250 MG tablet Commonly known as: ZITHROMAX        TAKE these medications    acetaminophen  500 MG tablet Commonly known as: TYLENOL  Take 1 tablet (500 mg total) by mouth every 6 (six) hours as needed for mild pain (pain score 1-3), fever or headache.   alendronate  70 MG tablet Commonly known as: FOSAMAX  Take 1 tablet (70 mg total) by mouth every 7 (seven) days. Take with a full glass of water on an empty stomach.   aspirin  EC 81 MG tablet Take 81 mg by mouth in the morning.   atorvastatin  80 MG tablet Commonly known as: LIPITOR  Take 1 tablet (80 mg total) by mouth daily.   benzonatate  100 MG capsule Commonly known as: TESSALON  Take 1 capsule (100 mg total) by mouth 3 (three) times daily as needed for cough.   busPIRone  15 MG tablet Commonly known as: BUSPAR  Take 2 tablets (30 mg total) by mouth in the morning AND 1 tablet (15 mg total) daily at 12 noon AND 1 tablet (15 mg total) every evening. What changed: See the new instructions.   clonazePAM  0.5 MG tablet Commonly  known as: KlonoPIN  Take 1 tablet (0.5 mg total) by mouth 2 (two) times daily as needed. What changed: when to take this   doxepin  75 MG capsule Commonly known as: SINEQUAN  Take 1 capsule (75 mg total) by mouth at bedtime.   doxycycline  100 MG tablet Commonly known as: VIBRA -TABS Take 1 tablet (100 mg total) by mouth every 12 (twelve) hours.   Dupixent  300 MG/2ML Soaj Generic drug: Dupilumab  Inject 300 mg into the skin every 14 (fourteen) days.   escitalopram  20 MG tablet Commonly known as: LEXAPRO  Take 1 tablet (20 mg total) by mouth daily.   ezetimibe  10 MG tablet Commonly known as: ZETIA  Take 1 tablet (10 mg total) by mouth daily.   furosemide  40 MG tablet Commonly known as: LASIX  Take 1 tablet (40 mg total) by mouth daily.   gabapentin  600 MG tablet Commonly known as: NEURONTIN  Take 1 tablet (600 mg total) by mouth 2 (two) times daily.   hydrOXYzine  25 MG tablet Commonly known as: ATARAX  Take 1 tablet (25 mg total) by mouth at bedtime as needed. What changed: when to take this   ipratropium-albuterol  0.5-2.5 (3) MG/3ML Soln Commonly known as: DUONEB Take 3 mLs by nebulization every 8 (eight) hours.   levothyroxine  112 MCG tablet Commonly known as: SYNTHROID  Take 1 tablet (112 mcg total) by mouth daily.   metFORMIN  500 MG tablet Commonly known as: GLUCOPHAGE  Take 1 tablet (500 mg total) by mouth 2 (two) times daily with a meal. What changed: when to take this   mirtazapine  45 MG tablet Commonly known as: REMERON  Take 1 tablet (45 mg total) by mouth at bedtime.   montelukast  10 MG tablet Commonly known as: SINGULAIR  Take 1 tablet (10 mg total) by mouth at bedtime.   nebivolol  10 MG tablet Commonly known as: Bystolic  Take 1 tablet (10 mg total) by mouth daily.   nicotine  21 mg/24hr patch Commonly known as: NICODERM CQ  - dosed in mg/24 hours Place 1 patch (21 mg total) onto the skin daily.   nitroGLYCERIN  0.4 MG SL  tablet Commonly known as:  NITROSTAT  Place 1 tablet (0.4 mg total) under the tongue every 5 (five) minutes as needed for chest pain.   Ohtuvayre  3 MG/2.5ML Susp Generic drug: Ensifentrine  Take 1 vial by nebulization in the morning and at bedtime.   omeprazole  40 MG capsule Commonly known as: PRILOSEC Take 1 capsule (40 mg total) by mouth 2 (two) times daily before a meal. NEED APPOINTMENT FOR ADDITIONAL REFILLS   OXYGEN  Inhale 4 L/min into the lungs continuous.   predniSONE  5 MG tablet Commonly known as: DELTASONE  Take 8 tablets (40 mg total) by mouth daily for 3 days, THEN 6 tablets (30 mg total) daily for 3 days, THEN 4 tablets (20 mg total) daily for 3 days, THEN 2 tablets (10 mg total) daily for 3 days, THEN 1 tablet (5 mg total) daily for 3 days, THEN 0.5 tablets (2.5 mg total) daily for 3 days. Then Stop. Start taking on: June 22, 2024 What changed:  medication strength See the new instructions.   ticagrelor  60 MG Tabs tablet Commonly known as: Brilinta  Take 1 tablet (60 mg total) by mouth 2 (two) times daily.   Trelegy Ellipta  200-62.5-25 MCG/ACT Aepb Generic drug: Fluticasone -Umeclidin-Vilant Inhale 1 puff into the lungs daily.   varenicline  1 MG tablet Commonly known as: CHANTIX  Take 1 tablet (1 mg total) by mouth 2 (two) times daily.   Ventolin  HFA 108 (90 Base) MCG/ACT inhaler Generic drug: albuterol  INHALE 2 PUFFS into THE lungs EVERY 6 HOURS AS NEEDED wheezing AND SHORTNESS OF BREATH         Follow-up Information     Vicci Barnie NOVAK, MD. Schedule an appointment as soon as possible for a visit in 1 week(s).   Specialty: Internal Medicine Why: And your pulmonologist in a week Contact information: [address] [place] [address] [phone]                 Major procedures and Radiology Reports - PLEASE review detailed and final reports thoroughly  -      DG Chest Port 1 View Result Date: 06/19/2024 EXAM: 1 VIEW(S) XRAY OF THE CHEST 06/19/2024  07:04:24 AM COMPARISON: 06/17/2024 CLINICAL HISTORY: SOB (shortness of breath) FINDINGS: LUNGS AND PLEURA: Mild streaky left basilar opacities, favor atelectasis. No pleural effusion. No pneumothorax. HEART AND MEDIASTINUM: Aortic atherosclerosis. No acute abnormality of the cardiac and mediastinal silhouettes. BONES AND SOFT TISSUES: Chronic left clavicle fracture. IMPRESSION: 1. Mild streaky left basilar opacities, favor atelectasis. Electronically signed by: Waddell Calk MD 06/19/2024 07:09 AM EST RP Workstation: [id number]   DG Chest 2 View Result Date: 06/17/2024 EXAM: 2 VIEW(S) XRAY OF THE CHEST 06/17/2024 11:07:00 PM COMPARISON: 06/12/2024 CLINICAL HISTORY: shob FINDINGS: LUNGS AND PLEURA: Left basilar airspace opacities, favored to represent atelectasis with infection not excluded. No pleural effusion or pneumothorax. HEART AND MEDIASTINUM: Atherosclerotic calcifications of aorta. BONES AND SOFT TISSUES: Multilevel degenerative changes of thoracic spine. Old left clavicular fracture. IMPRESSION: 1. Left basilar airspace opacities, favored to represent atelectasis with infection not excluded. Electronically signed by: Norman Gatlin MD 06/17/2024 11:14 PM EST RP Workstation: [id number]   ECHOCARDIOGRAM COMPLETE Result Date: 06/13/2024    ECHOCARDIOGRAM REPORT   Patient Name:   [name] [name] Date of Exam: 06/13/2024 Medical Rec #:  [id number]     Height:       61.0 in Accession #:    [id number]    Weight:       166.0 lb Date of Birth:  [birth year]  BSA:          1.745 m Patient Age:    67 years      BP:           164/86 mmHg Patient Gender: F             HR:           76 bpm. Exam Location:  Inpatient Procedure: 2D Echo (Both Spectral and Color Flow Doppler were utilized during            procedure). Indications:    abnormal ecg  History:        Patient has prior history of Echocardiogram examinations, most                 recent 12/05/2023. CAD, COPD and chronic kidney disease; Risk                  Factors:Current Smoker, Hypertension and Dyslipidemia.  Sonographer:    Tinnie Barefoot RDCS Referring Phys: [id number] TERRY SAILOR HALL  Sonographer Comments: Image acquisition challenging due to respiratory motion. IMPRESSIONS  1. Left ventricular ejection fraction, by estimation, is 65 to 70%. The left ventricle has normal function. The left ventricle has no regional wall motion abnormalities. Left ventricular diastolic parameters were normal.  2. Right ventricular systolic function is normal. The right ventricular size is normal. Tricuspid regurgitation signal is inadequate for assessing PA pressure.  3. The mitral valve is normal in structure. Trivial mitral valve regurgitation. No evidence of mitral stenosis.  4. The aortic valve is normal in structure. Aortic valve regurgitation is not visualized. No aortic stenosis is present.  5. The inferior vena cava is dilated in size with >50% respiratory variability, suggesting right atrial pressure of 8 mmHg. FINDINGS  Left Ventricle: Left ventricular ejection fraction, by estimation, is 65 to 70%. The left ventricle has normal function. The left ventricle has no regional wall motion abnormalities. The left ventricular internal cavity size was normal in size. There is  no left ventricular hypertrophy. Left ventricular diastolic parameters were normal. Normal left ventricular filling pressure. Right Ventricle: The right ventricular size is normal. No increase in right ventricular wall thickness. Right ventricular systolic function is normal. Tricuspid regurgitation signal is inadequate for assessing PA pressure. Left Atrium: Left atrial size was normal in size. Right Atrium: Right atrial size was normal in size. Pericardium: There is no evidence of pericardial effusion. Mitral Valve: The mitral valve is normal in structure. Trivial mitral valve regurgitation. No evidence of mitral valve stenosis. Tricuspid Valve: The tricuspid valve is normal in structure. Tricuspid valve  regurgitation is not demonstrated. No evidence of tricuspid stenosis. Aortic Valve: The aortic valve is normal in structure. Aortic valve regurgitation is not visualized. No aortic stenosis is present. Pulmonic Valve: The pulmonic valve was normal in structure. Pulmonic valve regurgitation is not visualized. No evidence of pulmonic stenosis. Aorta: The aortic root is normal in size and structure. Venous: The inferior vena cava is dilated in size with greater than 50% respiratory variability, suggesting right atrial pressure of 8 mmHg. IAS/Shunts: The interatrial septum appears to be lipomatous. No atrial level shunt detected by color flow Doppler.  LEFT VENTRICLE PLAX 2D LVIDd:         4.50 cm   Diastology LVIDs:         3.10 cm   LV e' medial:    8.27 cm/s LV PW:         0.80 cm   LV  E/e' medial:  12.0 LV IVS:        0.90 cm   LV e' lateral:   10.80 cm/s LVOT diam:     1.60 cm   LV E/e' lateral: 9.2 LV SV:         52 LV SV Index:   30 LVOT Area:     2.01 cm  RIGHT VENTRICLE             IVC RV Basal diam:  2.50 cm     IVC diam: 2.30 cm RV S prime:     14.70 cm/s TAPSE (M-mode): 2.6 cm LEFT ATRIUM             Index        RIGHT ATRIUM           Index LA diam:        3.60 cm 2.06 cm/m   RA Area:     12.60 cm LA Vol (A2C):   52.9 ml 30.32 ml/m  RA Volume:   29.30 ml  16.79 ml/m LA Vol (A4C):   44.5 ml 25.50 ml/m LA Biplane Vol: 48.5 ml 27.79 ml/m  AORTIC VALVE LVOT Vmax:   114.00 cm/s LVOT Vmean:  79.200 cm/s LVOT VTI:    0.258 m  AORTA Ao Root diam: 2.60 cm MITRAL VALVE MV Area (PHT): 4.31 cm    SHUNTS MV Decel Time: 176 msec    Systemic VTI:  0.26 m MV E velocity: 99.40 cm/s  Systemic Diam: 1.60 cm MV A velocity: 98.80 cm/s MV E/A ratio:  1.01 Wilbert Bihari MD Electronically signed by Wilbert Bihari MD Signature Date/Time: 06/13/2024/11:48:20 AM    Final    DG Chest Portable 1 View Result Date: 06/12/2024 CLINICAL DATA:  Short of breath. EXAM: PORTABLE CHEST 1 VIEW COMPARISON:  01/12/2024. FINDINGS: Cardiac  silhouette is normal in size. No mediastinal or hilar masses. Stable prominent bronchovascular markings. Lungs otherwise clear. No pleural effusion or pneumothorax. Skeletal structures are grossly intact. IMPRESSION: No active disease. Electronically Signed   By: Alm Parkins M.D.   On: 06/12/2024 16:24    Micro Results     Recent Results (from the past 240 hours)  Resp panel by RT-PCR (RSV, Flu A&B, Covid) Anterior Nasal Swab     Status: None   Collection Time: 06/17/24 10:31 PM   Specimen: Anterior Nasal Swab  Result Value Ref Range Status   SARS Coronavirus 2 by RT PCR NEGATIVE NEGATIVE Final   Influenza A by PCR NEGATIVE NEGATIVE Final   Influenza B by PCR NEGATIVE NEGATIVE Final    Comment: (NOTE) The Xpert Xpress SARS-CoV-2/FLU/RSV plus assay is intended as an aid in the diagnosis of influenza from Nasopharyngeal swab specimens and should not be used as a sole basis for treatment. Nasal washings and aspirates are unacceptable for Xpert Xpress SARS-CoV-2/FLU/RSV testing.  Fact Sheet for Patients: bloggercourse.com  Fact Sheet for Healthcare Providers: seriousbroker.it  This test is not yet approved or cleared by the [place]  FDA and has been authorized for detection and/or diagnosis of SARS-CoV-2 by FDA under an Emergency Use Authorization (EUA). This EUA will remain in effect (meaning this test can be used) for the duration of the COVID-19 declaration under Section 564(b)(1) of the Act, 21 U.S.C. section 360bbb-3(b)(1), unless the authorization is terminated or revoked.     Resp Syncytial Virus by PCR NEGATIVE NEGATIVE Final    Comment: (NOTE) Fact Sheet for Patients: bloggercourse.com  Fact Sheet for Healthcare Providers: seriousbroker.it  This test is not yet approved or cleared by the [place]  FDA and has been authorized for detection and/or diagnosis of  SARS-CoV-2 by FDA under an Emergency Use Authorization (EUA). This EUA will remain in effect (meaning this test can be used) for the duration of the COVID-19 declaration under Section 564(b)(1) of the Act, 21 U.S.C. section 360bbb-3(b)(1), unless the authorization is terminated or revoked.  Performed at [facility], [address], [place], [place] [address]   Culture, blood (routine x 2)     Status: None (Preliminary result)   Collection Time: 06/18/24  1:48 AM   Specimen: BLOOD RIGHT FOREARM  Result Value Ref Range Status   Specimen Description BLOOD RIGHT FOREARM  Final   Special Requests   Final    BOTTLES DRAWN AEROBIC AND ANAEROBIC Blood Culture results may not be optimal due to an inadequate volume of blood received in culture bottles   Culture   Final    NO GROWTH 3 DAYS Performed at [facility], [address] [place], [place] [address]    Report Status PENDING  Incomplete  Culture, blood (routine x 2)     Status: None (Preliminary result)   Collection Time: 06/18/24  1:49 AM   Specimen: BLOOD RIGHT HAND  Result Value Ref Range Status   Specimen Description BLOOD RIGHT HAND  Final   Special Requests   Final    BOTTLES DRAWN AEROBIC AND ANAEROBIC Blood Culture adequate volume   Culture   Final    NO GROWTH 3 DAYS Performed at [facility], [address], [place], [place] [address]    Report Status PENDING  Incomplete    Today   Subjective    [name] [name] today has no headache,no chest abdominal pain,no new weakness tingling or numbness, feels much better wants to go home today.     Objective   Blood pressure (!) 163/72, pulse 61, temperature 98.1 F (36.7 C), temperature source Oral, resp. rate 12, height 5' 1 (1.549 m), weight 78.4 kg, SpO2 94%.   Intake/Output Summary (Last 24 hours) at 06/22/2024 0804 Last data filed at 06/21/2024 1500 Gross per 24 hour  Intake 270 ml  Output 1850 ml  Net -1580 ml    Exam  Awake Alert, No new  F.N deficits,    [place].AT,PERRAL Supple Neck,   Symmetrical Chest wall movement, Good air movement bilaterally, minimal upper airway expiratory wheezes  RRR,No Gallops,   +ve B.Sounds, Abd Soft, Non tender,  No Cyanosis, Clubbing or edema    Data Review   Recent Labs  Lab 06/18/24 0039 06/18/24 0500 06/19/24 0411 06/20/24 0758 06/21/24 0300  WBC 14.8* 13.9* 18.8* 14.3* 15.3*  HGB 11.2* 10.7* 10.4* 10.5* 11.5*  HCT 36.1 34.5* 33.8* 34.0* 36.8  PLT 438* 450* 416* 387 431*  MCV 90.9 91.3 92.9 92.1 91.1  MCH 28.2 28.3 28.6 28.5 28.5  MCHC 31.0 31.0 30.8 30.9 31.3  RDW 16.5* 16.6* 16.9* 16.6* 16.6*  LYMPHSABS 0.5*  --  2.1 2.4 2.3  MONOABS 0.2  --  1.5* 1.2* 1.3*  EOSABS 0.0  --  0.0 0.0 0.0  BASOSABS 0.0  --  0.0 0.0 0.0    Recent Labs  Lab 06/18/24 0039 06/18/24 0114 06/18/24 0500 06/18/24 0529 06/18/24 0532 06/18/24 0534 06/19/24 0411 06/19/24 0829 06/20/24 0758 06/21/24 0300  NA 141  --  141  --   --   --  140  --  141 143  K 4.0  --  4.5  --   --   --  4.5  --  4.4 4.4  CL 100  --  98  --   --   --  106  --  103 100  CO2 32  --  30  --   --   --  27  --  30 34*  ANIONGAP 9  --  13  --   --   --  7  --  8 9  GLUCOSE 193*  --  200*  --   --   --  116*  --  116* 122*  BUN 18  --  16  --   --   --  21  --  19 24*  CREATININE 0.86  --  0.85  --   --   --  0.79  --  0.75 1.01*  AST  --   --  18  --   --  17  --   --   --   --   ALT  --   --  23  --   --  22  --   --   --   --   ALKPHOS  --   --  55  --   --  54  --   --   --   --   BILITOT  --   --  0.4  --   --  0.4  --   --   --   --   ALBUMIN  --   --  2.8*  --   --  2.8*  --   --   --   --   CRP  --   --   --   --   --   --   --  <0.5 <0.5 0.5  PROCALCITON  --   --   --   --   --  <0.10  --  <0.10 <0.10 <0.10  LATICACIDVEN  --  2.3*  --  2.3* 2.4*  --   --   --   --   --   BNP 103.4*  --   --   --   --   --   --   --   --   --   MG  --   --   --   --   --   --   --   --  1.8 2.0  CALCIUM  9.2  --  8.9  --   --    --  8.9  --  8.9 9.3    Total Time in preparing paper work, data evaluation and todays exam - 35 minutes  Signature  -    Lavada Stank M.D on 06/22/2024 at 8:04 AM   -  To page go to [email or web address]

## 2024-06-22 NOTE — Care Management Important Message (Signed)
 Important Message  Patient Details  Name: [name] [name] MRN: [id number] Date of Birth: 1956-04-25   Important Message Given:  Yes - Medicare IM     Claretta Deed 06/22/2024, 4:03 PM

## 2024-06-22 NOTE — TOC Transition Note (Signed)
 Transition of Care [facility]) - Discharge Note   Patient Details  Name: [name] [name] MRN: [id number] Date of Birth: 11-19-1955  [facility] CM/SW Contact:  [name], RN Phone Number: 06/22/2024, 8:09 AM   Clinical Narrative:    Patient will be discharging home today, with daughter providing transportation.  Referral sent to [facility] OP pulmonary rehab. Info on AVS.  Patient has home oxygen  and adequate supplies at home. Daughter will bring tank for transport home.   No further needs identified by CM.   Final next level of care: OP Rehab Barriers to Discharge: No Barriers Identified   Patient Goals and CMS Choice   CMS Medicare.gov Compare Post Acute Care list provided to:: Patient Choice offered to / list presented to : Patient      Discharge Placement                       Discharge Plan and Services Additional resources added to the After Visit Summary for                                       Social Drivers of Health (SDOH) Interventions SDOH Screenings   Food Insecurity: Food Insecurity Present (06/18/2024)  Housing: Low Risk  (06/18/2024)  Transportation Needs: No Transportation Needs (06/18/2024)  Utilities: Not At Risk (06/18/2024)  Alcohol Screen: Low Risk  (06/18/2024)  Depression (PHQ2-9): Medium Risk (05/18/2024)  Financial Resource Strain: Medium Risk (06/18/2024)  Physical Activity: Inactive (06/18/2024)  Social Connections: Moderately Isolated (06/18/2024)  Stress: No Stress Concern Present (06/18/2024)  Tobacco Use: High Risk (06/17/2024)  Health Literacy: Inadequate Health Literacy (08/07/2023)     Readmission Risk Interventions    06/20/2023    3:17 PM  Readmission Risk Prevention Plan  Transportation Screening Complete  HRI or Home Care Consult Complete  Social Work Consult for Recovery Care Planning/Counseling Complete  Palliative Care Screening Not Applicable  Medication Review Oceanographer) Complete

## 2024-06-23 ENCOUNTER — Telehealth: Payer: Self-pay | Admitting: *Deleted

## 2024-06-23 ENCOUNTER — Other Ambulatory Visit (HOSPITAL_COMMUNITY): Payer: Self-pay

## 2024-06-23 LAB — CULTURE, BLOOD (ROUTINE X 2)
Culture: NO GROWTH
Culture: NO GROWTH
Special Requests: ADEQUATE

## 2024-06-23 NOTE — Transitions of Care (Post Inpatient/ED Visit) (Signed)
 06/23/2024  Name: [name] [name] MRN: [id number] DOB: Aug 15, 1955  Today's TOC FU Call Status: Today's TOC FU Call Status:: Successful TOC FU Call Completed TOC FU Call Complete Date: 06/23/24  Patient's Name and Date of Birth confirmed. Name, DOB  Transition Care Management Follow-up Telephone Call Date of Discharge: 06/22/24 Discharge Facility: [facility]) Type of Discharge: Inpatient Admission Primary Inpatient Discharge Diagnosis:: Acute on chronic hypoxic respiratory failure due to COPD exacerbation and acute bronchitis. How have you been since you were released from the hospital?: Better (Per [name]) Any questions or concerns?: No  Items Reviewed: Did you receive and understand the discharge instructions provided?: Yes Medications obtained,verified, and reconciled?: Yes (Medications Reviewed) Any new allergies since your discharge?: No Dietary orders reviewed?: Yes Type of Diet Ordered:: Heart Healthy low carbohydrate Do you have support at home?: Yes People in Home [RPT]: alone Name of Support/Comfort Primary Source: Tiffiney daughter  Medications Reviewed Today: Medications Reviewed Today     Reviewed by Kennieth Cathlean DEL, RN (Case Manager) on 06/23/24 at 1024  Med List Status: <None>   Medication Order Taking? Sig Documenting Provider Last Dose Status Informant  acetaminophen  (TYLENOL ) 500 MG tablet [id number] Yes Take 1 tablet (500 mg total) by mouth every 6 (six) hours as needed for mild pain (pain score 1-3), fever or headache. Arlice Reichert, MD  Active Child, Pharmacy Records  albuterol  (VENTOLIN  HFA) [phone] Base) MCG/ACT inhaler [id number] Yes INHALE 2 PUFFS into THE lungs EVERY 6 HOURS AS NEEDED wheezing AND SHORTNESS OF [name] Vicci Barnie [name], MD  Active Pharmacy Records, Child  alendronate  (FOSAMAX ) 70 MG tablet [id number] Yes Take 1 tablet (70 mg total) by mouth every 7 (seven) days. Take with a full glass of water on an empty stomach. Vicci Barnie [name], MD  Active Child, Pharmacy Records  aspirin  EC 81 MG tablet [id number] Yes Take 81 mg by mouth in the morning. [provider]  Active Pharmacy Records, Child  atorvastatin  (LIPITOR ) 80 MG tablet [id number] Yes Take 1 tablet (80 mg total) by mouth daily. Daneen Damien BROCKS, NP  Active Pharmacy Records, Child  benzonatate  (TESSALON ) 100 MG capsule [id number] Yes Take 1 capsule (100 mg total) by mouth 3 (three) times daily as needed for cough. Singh, Prashant K, MD  Active   busPIRone  (BUSPAR ) 15 MG tablet [id number] Yes Take 2 tablets (30 mg total) by mouth in the morning AND 1 tablet (15 mg total) daily at 12 noon AND 1 tablet (15 mg total) every evening.  Patient taking differently: Take 60mg  by mouth at bedtime   Nwoko, Uchenna E, PA  Active Child, Pharmacy Records           Med Note (WHITE, DONETA RAMAN   Fri Jun 18, 2024  7:22 AM) Patient takes 4 tablets at night d/t drowsiness.  clonazePAM  (KLONOPIN ) 0.5 MG tablet [id number] Yes Take 1 tablet (0.5 mg total) by mouth 2 (two) times daily as needed.  Patient taking differently: Take 0.5 mg by mouth 2 (two) times daily.   Nwoko, Uchenna E, PA  Active Child, Pharmacy Records  doxepin  (SINEQUAN ) 75 MG capsule [id number] Yes Take 1 capsule (75 mg total) by mouth at bedtime. Nwoko, Uchenna E, PA  Active Child, Pharmacy Records  doxycycline  (VIBRA -TABS) 100 MG tablet [id number] Yes Take 1 tablet (100 mg total) by mouth every 12 (twelve) hours. Dennise Lavada POUR, MD  Active   Dupilumab  (DUPIXENT ) 300 MG/2ML EMMANUEL [id number] Yes Inject 300 mg into the skin  every 14 (fourteen) days. Kara Dorn NOVAK, MD  Active Child, Pharmacy Records  escitalopram  (LEXAPRO ) 20 MG tablet [id number] Yes Take 1 tablet (20 mg total) by mouth daily. Nwoko, Uchenna E, PA  Active Child, Pharmacy Records  ezetimibe  (ZETIA ) 10 MG tablet [id number] Yes Take 1 tablet (10 mg total) by mouth daily. Meng, Hao, [place]  Active   Fluticasone -Umeclidin-Vilant (TRELEGY ELLIPTA ) 200-62.5-25  MCG/ACT AEPB [id number] Yes Inhale 1 puff into the lungs daily. Kara Dorn NOVAK, MD  Active Pharmacy Records, Child  furosemide  (LASIX ) 40 MG tablet [id number] Yes Take 1 tablet (40 mg total) by mouth daily. Vicci Barnie NOVAK, MD  Active Child, Pharmacy Records  gabapentin  (NEURONTIN ) 600 MG tablet [id number] Yes Take 1 tablet (600 mg total) by mouth 2 (two) times daily. Vicci Barnie NOVAK, MD  Active Pharmacy Records, Child  hydrOXYzine  (ATARAX ) 25 MG tablet [id number] Yes Take 1 tablet (25 mg total) by mouth at bedtime as needed.  Patient taking differently: Take 25 mg by mouth every evening.   Nwoko, Uchenna E, PA  Active Child, Pharmacy Records  ipratropium-albuterol  (DUONEB) 0.5-2.5 (3) MG/3ML SOLN [id number]  Take 3 mLs by nebulization every 8 (eight) hours.  Patient taking differently: Take 3 mLs by nebulization every 8 (eight) hours.   Kara Dorn NOVAK, MD  Active Child, Pharmacy Records  levothyroxine  (SYNTHROID ) 112 MCG tablet [id number] Yes Take 1 tablet (112 mcg total) by mouth daily. Vicci Barnie NOVAK, MD  Active Child, Pharmacy Records  metFORMIN  (GLUCOPHAGE ) 500 MG tablet [id number] Yes Take 1 tablet (500 mg total) by mouth 2 (two) times daily with a meal.  Patient taking differently: Take 500 mg by mouth daily with breakfast.   Vicci Barnie NOVAK, MD  Active Child, Pharmacy Records  mirtazapine  (REMERON ) 45 MG tablet [id number] Yes Take 1 tablet (45 mg total) by mouth at bedtime. Nwoko, Uchenna E, PA  Active Child, Pharmacy Records  montelukast  (SINGULAIR ) 10 MG tablet [id number] Yes Take 1 tablet (10 mg total) by mouth at bedtime. Vicci Barnie NOVAK, MD  Active Pharmacy Records, Child  nebivolol  (BYSTOLIC ) 10 MG tablet [id number] Yes Take 1 tablet (10 mg total) by mouth daily. Daneen Damien BROCKS, NP  Active Pharmacy Records, Child  nicotine  (NICODERM CQ  - DOSED IN MG/24 HOURS) 21 mg/24hr patch [id number] Yes Place 1 patch (21 mg total) onto the skin daily. Singh, Prashant K, MD  Active    nitroGLYCERIN  (NITROSTAT ) 0.4 MG SL tablet [id number] Yes Place 1 tablet (0.4 mg total) under the tongue every 5 (five) minutes as needed for chest pain. Daneen Damien BROCKS, NP  Active Child, Pharmacy Records  OHTUVAYRE  3 MG/2.5ML SUSP [id number]  Take 1 vial by nebulization in the morning and at bedtime. [provider]  Active Child, Pharmacy Records  omeprazole  (PRILOSEC) 40 MG capsule [id number]  Take 1 capsule (40 mg total) by mouth 2 (two) times daily before a meal. NEED APPOINTMENT FOR ADDITIONAL REFILLS Legrand Victory LITTIE DOUGLAS, MD  Active Child, Pharmacy Records  OXYGEN  [id number]  Inhale 4 L/min into the lungs continuous. [provider]  Active Pharmacy Records, Child  predniSONE  (DELTASONE ) 5 MG tablet [id number]  Take 8 tablets (40 mg total) by mouth daily for 3 days, THEN 6 tablets (30 mg total) daily for 3 days, THEN 4 tablets (20 mg total) daily for 3 days, THEN 2 tablets (10 mg total) daily for 3 days, THEN 1 tablet (5 mg total) daily for 3 days, THEN 0.5 tablets (2.5 mg total) daily for 3 days.  Then Stop. Singh, Prashant K, MD  Active   ticagrelor  (BRILINTA ) 60 MG TABS tablet [id number] Yes Take 1 tablet (60 mg total) by mouth 2 (two) times daily. Burnard Debby LABOR, MD  Active Pharmacy Records, Child  varenicline  (CHANTIX ) 1 MG tablet [id number] Yes Take 1 tablet (1 mg total) by mouth 2 (two) times daily. Kara Dorn NOVAK, MD  Active Child, Pharmacy Records  Med List Note Steffi Nian, CPhT 12/05/23 1009): Bowne,Tiffiney (Daughter) - sometimes the patient does miss doses  [phone] (Mobile)             Home Care and Equipment/Supplies: Were Home Health Services Ordered?: NA  Functional Questionnaire: Do you need assistance with bathing/showering or dressing?: No Do you need assistance with meal preparation?: No Do you need assistance with eating?: No Do you have difficulty maintaining continence: No Do you need assistance with getting out of bed/getting out of  a chair/moving?: Yes (Uses oxygen  when ambulating) Do you have difficulty managing or taking your medications?: Yes (Per daughter she has to do constant reminders for patient to take meds. She sets alarms)  Follow up appointments reviewed: PCP Follow-up appointment confirmed?: Yes Date of PCP follow-up appointment?: 07/02/24 Follow-up Provider: [facility] Follow-up appointment confirmed?: Yes Date of Specialist follow-up appointment?: 06/24/24 Follow-Up Specialty Provider:: 12/11 Dorn Dewald/ 12/30 [facility] Do you need transportation to your follow-up appointment?: No Do you understand care options if your condition(s) worsen?: Yes-patient verbalized understanding  SDOH Interventions Today    Flowsheet Row Most Recent Value  SDOH Interventions   Food Insecurity Interventions Intervention Not Indicated  Housing Interventions Intervention Not Indicated  Transportation Interventions Intervention Not Indicated, Patient Resources (Friends/Family)  Utilities Interventions Intervention Not Indicated   Discussed and offered 30 day TOC program.  Patient declined.  The patient has been provided with contact information for the care management team and has been advised to call with any health -related questions or concerns.  The patient verbalized understanding with current plan of care.  The patient is directed to their insurance card regarding availability of benefits coverage  Cathlean Headland BSN RN [facility] Management Coordinator [email or web address] Direct Dial: [phone]  Fax: [phone] Website: [email or web address]

## 2024-06-24 ENCOUNTER — Encounter: Payer: Self-pay | Admitting: Pulmonary Disease

## 2024-06-24 ENCOUNTER — Other Ambulatory Visit (HOSPITAL_COMMUNITY): Payer: Self-pay

## 2024-06-24 ENCOUNTER — Ambulatory Visit: Admitting: Pulmonary Disease

## 2024-06-24 VITALS — BP 116/58 | HR 69 | Ht 61.0 in | Wt 168.0 lb

## 2024-06-24 DIAGNOSIS — J4489 Other specified chronic obstructive pulmonary disease: Secondary | ICD-10-CM

## 2024-06-24 MED ORDER — AZITHROMYCIN 250 MG PO TABS
250.0000 mg | ORAL_TABLET | Freq: Every day | ORAL | 5 refills | Status: DC
  Filled 2024-06-24 – 2024-07-25 (×2): qty 30, 30d supply, fill #0

## 2024-06-24 NOTE — Patient Instructions (Addendum)
 Continue azithromycin  250mg , 1 tablet daily  Complete prednisone  taper  Continue dupixent  injections  Continue to work on quitting smoking using chantix  twice daily, patches and as needed lozenges  Continue trelegy inhaler 1 puff daily - rinse mouth out after each use  Continue Ohtuvayre  nebulizer treatments twice daily  Continue albuterol  inhaler 1-2 puffs every 4-6 hours as needed  Follow up in 1 month

## 2024-06-24 NOTE — Progress Notes (Signed)
 Established Patient Pulmonology Office Visit   Subjective:  Patient ID: [name] [name], female    DOB: 1955-12-14  MRN: [id number]  CC:  Chief Complaint  Patient presents with   Medical Management of Chronic Issues    Pt states     Discussed the use of AI scribe software for clinical note transcription with the patient, who gave verbal consent to proceed.  History of Present Illness [name] [name] is a 68 year old female with chronic respiratory issues who presents for follow-up after recent hospitalizations.  She has cut back smoking from one pack per day to eight cigarettes and is down to one cigarette today.  She recently started Dupixent , has completed one injection, and plans her next dose tomorrow. She had brief symptom improvement before a setback leading to hospitalization.  She was hospitalized twice recently for acute respiratory distress with oxygen  saturations down to 80%. She describes sudden episodes of severe dyspnea with fear for her life. She is on a prednisone  taper, now at six pills daily, doxycycline  with two days remaining, and azithromycin  250 mg daily, which she continued despite instructions to stop.  She uses a nebulizer and home oxygen  and feels wheezy today.  Her caregiver manages her medications, and she occasionally misses doses. She is awaiting pulmonary rehabilitation, but transportation is difficult because she does not drive.        ROS   Current Medications[1]      Objective:  BP (!) 116/58   Pulse 69   Ht 5' 1 (1.549 m) Comment: per pt  Wt 168 lb (76.2 kg)   SpO2 100%   PF (!) 3 L/min   BMI 31.74 kg/m     Physical Exam Constitutional:      General: She is not in acute distress.    Appearance: Normal appearance.  Eyes:     General: No scleral icterus.    Conjunctiva/sclera: Conjunctivae normal.  Cardiovascular:     Rate and Rhythm: Normal rate and regular rhythm.  Pulmonary:     Breath sounds: No wheezing, rhonchi or  rales.  Musculoskeletal:     Right lower leg: No edema.     Left lower leg: No edema.  Skin:    General: Skin is warm and dry.  Neurological:     General: No focal deficit present.      Diagnostic Review:  Last CBC Lab Results  Component Value Date   WBC 15.3 (H) 06/21/2024   HGB 11.5 (L) 06/21/2024   HCT 36.8 06/21/2024   MCV 91.1 06/21/2024   MCH 28.5 06/21/2024   RDW 16.6 (H) 06/21/2024   PLT 431 (H) 06/21/2024   Last metabolic panel Lab Results  Component Value Date   GLUCOSE 122 (H) 06/21/2024   NA 143 06/21/2024   K 4.4 06/21/2024   CL 100 06/21/2024   CO2 34 (H) 06/21/2024   BUN 24 (H) 06/21/2024   CREATININE 1.01 (H) 06/21/2024   GFRNONAA >60 06/21/2024   CALCIUM  9.3 06/21/2024   PHOS 3.5 06/13/2024   PROT 5.9 (L) 06/18/2024   ALBUMIN 2.8 (L) 06/18/2024   LABGLOB 2.2 06/26/2023   AGRATIO 2.0 09/14/2021   BILITOT 0.4 06/18/2024   ALKPHOS 54 06/18/2024   AST 17 06/18/2024   ALT 22 06/18/2024   ANIONGAP 9 06/21/2024       Assessment & Plan:   Assessment & Plan Asthma-COPD overlap syndrome (HCC)  Orders:   azithromycin  (ZITHROMAX ) 250 MG tablet; Take 1 tablet (250  mg total) by mouth daily.   Assessment and Plan Assessment & Plan Asthma-COPD overlap syndrome Recent exacerbation required hospitalization. Prednisone  taper ongoing. Wheezing present. Smoking cessation critical. Dupixent  initiated with prior improvement. Azithromycin  and doxycycline  for infection control. - Continue prednisone  taper. - Continue Dupixent  injections. - Continue azithromycin  and doxycycline . - Refer to pulmonary rehabilitation twice weekly. - Encouraged smoking cessation.  Cigarette Smoker - heavily encouraged patient to quit. It is the main reason why she continues to have exacerbations in her breathing      Return in about 1 month (around 07/25/2024) for f/u visit Dr. Kara.   Dorn [name] Kara, MD     [1]  Current Outpatient Medications:    acetaminophen   (TYLENOL ) 500 MG tablet, Take 1 tablet (500 mg total) by mouth every 6 (six) hours as needed for mild pain (pain score 1-3), fever or headache., Disp: , Rfl:    albuterol  (VENTOLIN  HFA) 108 (90 Base) MCG/ACT inhaler, INHALE 2 PUFFS into THE lungs EVERY 6 HOURS AS NEEDED wheezing AND SHORTNESS OF BREATH, Disp: 54 g, Rfl: 1   alendronate  (FOSAMAX ) 70 MG tablet, Take 1 tablet (70 mg total) by mouth every 7 (seven) days. Take with a full glass of water on an empty stomach., Disp: 12 tablet, Rfl: 0   aspirin  EC 81 MG tablet, Take 81 mg by mouth in the morning., Disp: , Rfl:    atorvastatin  (LIPITOR ) 80 MG tablet, Take 1 tablet (80 mg total) by mouth daily., Disp: 90 tablet, Rfl: 3   azithromycin  (ZITHROMAX ) 250 MG tablet, Take 1 tablet (250 mg total) by mouth daily., Disp: 30 tablet, Rfl: 5   benzonatate  (TESSALON ) 100 MG capsule, Take 1 capsule (100 mg total) by mouth 3 (three) times daily as needed for cough., Disp: 20 capsule, Rfl: 0   busPIRone  (BUSPAR ) 15 MG tablet, Take 2 tablets (30 mg total) by mouth in the morning AND 1 tablet (15 mg total) daily at 12 noon AND 1 tablet (15 mg total) every evening. (Patient taking differently: Take 60mg  by mouth at bedtime), Disp: 360 tablet, Rfl: 0   clonazePAM  (KLONOPIN ) 0.5 MG tablet, Take 1 tablet (0.5 mg total) by mouth 2 (two) times daily as needed. (Patient taking differently: Take 0.5 mg by mouth 2 (two) times daily.), Disp: 60 tablet, Rfl: 0   doxepin  (SINEQUAN ) 75 MG capsule, Take 1 capsule (75 mg total) by mouth at bedtime., Disp: 90 capsule, Rfl: 0   Dupilumab  (DUPIXENT ) 300 MG/2ML SOAJ, Inject 300 mg into the skin every 14 (fourteen) days., Disp: 4 mL, Rfl: 3   escitalopram  (LEXAPRO ) 20 MG tablet, Take 1 tablet (20 mg total) by mouth daily., Disp: 90 tablet, Rfl: 0   ezetimibe  (ZETIA ) 10 MG tablet, Take 1 tablet (10 mg total) by mouth daily., Disp: 90 tablet, Rfl: 3   Fluticasone -Umeclidin-Vilant (TRELEGY ELLIPTA ) 200-62.5-25 MCG/ACT AEPB, Inhale 1 puff  into the lungs daily., Disp: 60 each, Rfl: 11   furosemide  (LASIX ) 40 MG tablet, Take 1 tablet (40 mg total) by mouth daily., Disp: 90 tablet, Rfl: 0   gabapentin  (NEURONTIN ) 600 MG tablet, Take 1 tablet (600 mg total) by mouth 2 (two) times daily., Disp: 180 tablet, Rfl: 2   hydrOXYzine  (ATARAX ) 25 MG tablet, Take 1 tablet (25 mg total) by mouth at bedtime as needed. (Patient taking differently: Take 25 mg by mouth every evening.), Disp: 30 tablet, Rfl: 2   ipratropium-albuterol  (DUONEB) 0.5-2.5 (3) MG/3ML SOLN, Take 3 mLs by nebulization every 8 (eight) hours.,  Disp: 90 mL, Rfl: 1   levothyroxine  (SYNTHROID ) 112 MCG tablet, Take 1 tablet (112 mcg total) by mouth daily., Disp: 90 tablet, Rfl: 2   metFORMIN  (GLUCOPHAGE ) 500 MG tablet, Take 1 tablet (500 mg total) by mouth 2 (two) times daily with a meal. (Patient taking differently: Take 500 mg by mouth daily with breakfast.), Disp: 180 tablet, Rfl: 1   mirtazapine  (REMERON ) 45 MG tablet, Take 1 tablet (45 mg total) by mouth at bedtime., Disp: 90 tablet, Rfl: 0   montelukast  (SINGULAIR ) 10 MG tablet, Take 1 tablet (10 mg total) by mouth at bedtime., Disp: 90 tablet, Rfl: 2   nebivolol  (BYSTOLIC ) 10 MG tablet, Take 1 tablet (10 mg total) by mouth daily., Disp: 90 tablet, Rfl: 3   nicotine  (NICODERM CQ  - DOSED IN MG/24 HOURS) 21 mg/24hr patch, Place 1 patch (21 mg total) onto the skin daily., Disp: 28 patch, Rfl: 0   nitroGLYCERIN  (NITROSTAT ) 0.4 MG SL tablet, Place 1 tablet (0.4 mg total) under the tongue every 5 (five) minutes as needed for chest pain., Disp: 25 tablet, Rfl: 3   OHTUVAYRE  3 MG/2.5ML SUSP, Take 1 vial by nebulization in the morning and at bedtime., Disp: , Rfl:    omeprazole  (PRILOSEC) 40 MG capsule, Take 1 capsule (40 mg total) by mouth 2 (two) times daily before a meal. NEED APPOINTMENT FOR ADDITIONAL REFILLS, Disp: 60 capsule, Rfl: 0   OXYGEN , Inhale 4 L/min into the lungs continuous., Disp: , Rfl:    predniSONE  (DELTASONE ) 5 MG  tablet, Take 8 tablets (40 mg total) by mouth daily for 3 days, THEN 6 tablets (30 mg total) daily for 3 days, THEN 4 tablets (20 mg total) daily for 3 days, THEN 2 tablets (10 mg total) daily for 3 days, THEN 1 tablet (5 mg total) daily for 3 days, THEN 0.5 tablets (2.5 mg total) daily for 3 days. Then Stop., Disp: 65 tablet, Rfl: 0   ticagrelor  (BRILINTA ) 60 MG TABS tablet, Take 1 tablet (60 mg total) by mouth 2 (two) times daily., Disp: 180 tablet, Rfl: 2   varenicline  (CHANTIX ) 1 MG tablet, Take 1 tablet (1 mg total) by mouth 2 (two) times daily., Disp: 60 tablet, Rfl: 3

## 2024-06-25 ENCOUNTER — Other Ambulatory Visit (HOSPITAL_COMMUNITY): Payer: Self-pay

## 2024-06-25 ENCOUNTER — Other Ambulatory Visit (HOSPITAL_COMMUNITY): Payer: Self-pay | Admitting: Emergency Medicine

## 2024-06-25 NOTE — Progress Notes (Signed)
 Paramedicine Encounter    Patient ID: [name] [name], female    DOB: October 25, 1955, 68 y.o.   MRN: [id number]   Complaints NONE  Assessment A&O x 4, skin W&D w/ good color.  Pt was doing her morning breathing treatment on my arrival.  She denies chest pain or any new or worsening SOB. Lung sounds clear.  Still has a non-productive cough.  BP elevated today but did not take her morning meds until I arrived for home visit.  Compliance with meds YES  Pill box filled  Filled by daughter.  Refills needed NONE  Meds changes since last visit NONE     Social changes NONE   BP (!) 152/78 (BP Location: Left Arm, Patient Position: Sitting, Cuff Size: Normal)   Pulse 78   Resp 16   Wt 166 lb (75.3 kg)   SpO2 95%   BMI 31.37 kg/m  Weight yesterday- Last visit weight-173lb  ACTION: Home visit completed  [name] [phone] 06/25/2024  Patient Care Team: Vicci Barnie [name], MD as PCP - General (Internal Medicine) Shaaron, Lamar HERO, MD as Consulting Physician (Gastroenterology) Onesimo Emaline Brink, MD as Consulting Physician (Hematology) Collene Reginia BRAVO, [place] as Physician Assistant (Physician Assistant) Carolee Heron [name], RN as Case Manager  Patient Active Problem List   Diagnosis Date Noted   (HFpEF) heart failure with preserved ejection fraction (HCC) 06/18/2024   Non-insulin  dependent type 2 diabetes mellitus (HCC) 06/18/2024   History of depression 06/18/2024   Diabetic neuropathy (HCC) 06/18/2024   Hypokalemia 01/08/2024   Acute on chronic heart failure with preserved ejection fraction (HFpEF, >= 50%) (HCC) 12/05/2023   At risk for polypharmacy 12/04/2023   Long-term current use of antidepressant 11/17/2023   Long-term current use of benzodiazepine 11/17/2023   Sigmoid diverticulitis 11/12/2023   Acute kidney injury superimposed on chronic kidney disease 11/12/2023   CKD (chronic kidney disease) stage 2, GFR 60-89 ml/min 11/12/2023   Sepsis due to pneumonia  (HCC) 11/12/2023   Acute on chronic hypoxic respiratory failure (HCC) 10/05/2023   Pneumothorax, right 09/16/2023   COPD with acute exacerbation (HCC) 09/01/2023   Chronic hypoxic respiratory failure (HCC) 09/01/2023   Continuous dependence on cigarette smoking 09/01/2023   History of CAD (coronary artery disease) 09/01/2023   CAP (community acquired pneumonia) 09/01/2023   Full code status 06/14/2023   Acute hypoxic respiratory failure (HCC) 06/13/2023   Acute exacerbation of chronic obstructive pulmonary disease (COPD) (HCC) 06/12/2023   Nicotine  dependence 05/03/2023   Substance abuse (HCC) 09/10/2022   Iron  deficiency anemia 11/02/2021   Major depressive disorder, recurrent, in full remission with anxious distress 05/31/2021   Chronic neck pain 02/26/2021   GAD (generalized anxiety disorder) 01/08/2021   Esophageal dysphagia 04/28/2019   Bipolar disorder (HCC) 09/17/2018   Insomnia 10/28/2017   Prediabetes 05/28/2017   QT prolongation 03/25/2017   Psoriasis 06/22/2015   COPD (chronic obstructive pulmonary disease) (HCC)GOLD E 05/18/2015   Anxiety and depression 07/10/2013   Essential hypertension 06/07/2013   Hyperlipidemia 06/07/2013   Asthma, chronic 06/07/2013   Emphysema lung (HCC) 06/07/2013   Chronic back pain 06/07/2013   CAD S/P percutaneous coronary angioplasty 06/07/2013   GERD (gastroesophageal reflux disease) 06/07/2013   Breast lump on left side at 3 o'clock position 10/13/2012   S/P abdominal hysterectomy and right salpingo-oophorectomy 08/29/2011   COPD exacerbation (HCC) 09/15/2009   TOBACCO ABUSE 07/17/2009   Chronic rhinitis 07/17/2009   Lung nodule < 6cm on CT 07/17/2009   Hypothyroidism 12/22/2008  Chronic depression 12/22/2008   Current Medications[1] Allergies[2]   Social History   Socioeconomic History   Marital status: Divorced    Spouse name: Not on file   Number of children: 1   Years of education: Not on file   Highest education  level: 10th grade  Occupational History   Not on file  Tobacco Use   Smoking status: Every Day    Current packs/day: 0.50    Average packs/day: 0.5 packs/day for 50.0 years (25.0 ttl pk-yrs)    Types: Cigarettes   Smokeless tobacco: Never  Vaping Use   Vaping status: Some Days   Substances: Nicotine   Substance and Sexual Activity   Alcohol use: Yes    Comment: once a week   Drug use: Yes    Types: Marijuana    Comment: history of cocaine use, been about a year, per patient (12/18/21)   Sexual activity: Not Currently    Birth control/protection: Surgical  Other Topics Concern   Not on file  Social History Narrative   Not on file   Social Drivers of Health   Tobacco Use: High Risk (06/24/2024)   Patient History    Smoking Tobacco Use: Every Day    Smokeless Tobacco Use: Never    Passive Exposure: Not on file  Financial Resource Strain: Medium Risk (06/18/2024)   Overall Financial Resource Strain (CARDIA)    Difficulty of Paying Living Expenses: Somewhat hard  Food Insecurity: No Food Insecurity (06/23/2024)   Epic    Worried About Programme Researcher, Broadcasting/film/video in the Last Year: Never true    Ran Out of Food in the Last Year: Never true  Recent Concern: Food Insecurity - Food Insecurity Present (06/18/2024)   Epic    Worried About Programme Researcher, Broadcasting/film/video in the Last Year: Sometimes true    Ran Out of Food in the Last Year: Sometimes true  Transportation Needs: No Transportation Needs (06/23/2024)   Epic    Lack of Transportation (Medical): No    Lack of Transportation (Non-Medical): No  Physical Activity: Inactive (06/18/2024)   Exercise Vital Sign    Days of Exercise per Week: 0 days    Minutes of Exercise per Session: Not on file  Stress: No Stress Concern Present (06/18/2024)   [employer] of Occupational Health - Occupational Stress Questionnaire    Feeling of Stress: Only a little  Social Connections: Moderately Isolated (06/18/2024)   Social Connection and Isolation Panel     Frequency of Communication with Friends and Family: More than three times a week    Frequency of Social Gatherings with Friends and Family: Once a week    Attends Religious Services: Never    Database Administrator or Organizations: No    Attends Banker Meetings: 1 to 4 times per year    Marital Status: Divorced  Intimate Partner Violence: Not At Risk (06/23/2024)   Epic    Fear of Current or Ex-Partner: No    Emotionally Abused: No    Physically Abused: No    Sexually Abused: No  Depression (PHQ2-9): Medium Risk (05/18/2024)   Depression (PHQ2-9)    PHQ-2 Score: 9  Alcohol Screen: Low Risk (06/18/2024)   Alcohol Screen    Last Alcohol Screening Score (AUDIT): 1  Housing: Low Risk (06/23/2024)   Epic    Unable to Pay for Housing in the Last Year: No    Number of Times Moved in the Last Year: 0    Homeless  in the Last Year: No  Utilities: Not At Risk (06/23/2024)   Epic    Threatened with loss of utilities: No  Health Literacy: Inadequate Health Literacy (08/07/2023)   B1300 Health Literacy    Frequency of need for help with medical instructions: Always    Physical Exam      Future Appointments  Date Time Provider Department Center  07/02/2024  2:00 PM Jaycee Greig PARAS, NP [facility] [address]  07/13/2024  4:00 PM Floretta Mallard, MD [facility]  08/11/2024  2:45 PM Kara Dorn NOVAK, MD [facility] [address]  05/06/2025  1:30 PM [facility] None  05/06/2025  2:00 PM Onesimo Emaline Brink, MD [facility] None          [1]  Current Outpatient Medications:    acetaminophen  (TYLENOL ) 500 MG tablet, Take 1 tablet (500 mg total) by mouth every 6 (six) hours as needed for mild pain (pain score 1-3), fever or headache., Disp: , Rfl:    albuterol  (VENTOLIN  HFA) 108 (90 Base) MCG/ACT inhaler, INHALE 2 PUFFS into THE lungs EVERY 6 HOURS AS NEEDED wheezing AND SHORTNESS OF BREATH, Disp: 54 g, Rfl: 1   alendronate  (FOSAMAX ) 70 MG tablet, Take  1 tablet (70 mg total) by mouth every 7 (seven) days. Take with a full glass of water on an empty stomach., Disp: 12 tablet, Rfl: 0   aspirin  EC 81 MG tablet, Take 81 mg by mouth in the morning., Disp: , Rfl:    atorvastatin  (LIPITOR ) 80 MG tablet, Take 1 tablet (80 mg total) by mouth daily., Disp: 90 tablet, Rfl: 3   benzonatate  (TESSALON ) 100 MG capsule, Take 1 capsule (100 mg total) by mouth 3 (three) times daily as needed for cough., Disp: 20 capsule, Rfl: 0   busPIRone  (BUSPAR ) 15 MG tablet, Take 2 tablets (30 mg total) by mouth in the morning AND 1 tablet (15 mg total) daily at 12 noon AND 1 tablet (15 mg total) every evening. (Patient taking differently: Take 60mg  by mouth at bedtime), Disp: 360 tablet, Rfl: 0   clonazePAM  (KLONOPIN ) 0.5 MG tablet, Take 1 tablet (0.5 mg total) by mouth 2 (two) times daily as needed. (Patient taking differently: Take 0.5 mg by mouth 2 (two) times daily.), Disp: 60 tablet, Rfl: 0   doxepin  (SINEQUAN ) 75 MG capsule, Take 1 capsule (75 mg total) by mouth at bedtime., Disp: 90 capsule, Rfl: 0   Dupilumab  (DUPIXENT ) 300 MG/2ML SOAJ, Inject 300 mg into the skin every 14 (fourteen) days., Disp: 4 mL, Rfl: 3   escitalopram  (LEXAPRO ) 20 MG tablet, Take 1 tablet (20 mg total) by mouth daily., Disp: 90 tablet, Rfl: 0   ezetimibe  (ZETIA ) 10 MG tablet, Take 1 tablet (10 mg total) by mouth daily., Disp: 90 tablet, Rfl: 3   Fluticasone -Umeclidin-Vilant (TRELEGY ELLIPTA ) 200-62.5-25 MCG/ACT AEPB, Inhale 1 puff into the lungs daily., Disp: 60 each, Rfl: 11   furosemide  (LASIX ) 40 MG tablet, Take 1 tablet (40 mg total) by mouth daily., Disp: 90 tablet, Rfl: 0   gabapentin  (NEURONTIN ) 600 MG tablet, Take 1 tablet (600 mg total) by mouth 2 (two) times daily., Disp: 180 tablet, Rfl: 2   hydrOXYzine  (ATARAX ) 25 MG tablet, Take 1 tablet (25 mg total) by mouth at bedtime as needed. (Patient taking differently: Take 25 mg by mouth every evening.), Disp: 30 tablet, Rfl: 2    ipratropium-albuterol  (DUONEB) 0.5-2.5 (3) MG/3ML SOLN, Take 3 mLs by nebulization every 8 (eight) hours., Disp: 90 mL, Rfl: 1  levothyroxine  (SYNTHROID ) 112 MCG tablet, Take 1 tablet (112 mcg total) by mouth daily., Disp: 90 tablet, Rfl: 2   metFORMIN  (GLUCOPHAGE ) 500 MG tablet, Take 1 tablet (500 mg total) by mouth 2 (two) times daily with a meal. (Patient taking differently: Take 500 mg by mouth daily with breakfast.), Disp: 180 tablet, Rfl: 1   mirtazapine  (REMERON ) 45 MG tablet, Take 1 tablet (45 mg total) by mouth at bedtime., Disp: 90 tablet, Rfl: 0   montelukast  (SINGULAIR ) 10 MG tablet, Take 1 tablet (10 mg total) by mouth at bedtime., Disp: 90 tablet, Rfl: 2   nebivolol  (BYSTOLIC ) 10 MG tablet, Take 1 tablet (10 mg total) by mouth daily., Disp: 90 tablet, Rfl: 3   nitroGLYCERIN  (NITROSTAT ) 0.4 MG SL tablet, Place 1 tablet (0.4 mg total) under the tongue every 5 (five) minutes as needed for chest pain., Disp: 25 tablet, Rfl: 3   OHTUVAYRE  3 MG/2.5ML SUSP, Take 1 vial by nebulization in the morning and at bedtime., Disp: , Rfl:    omeprazole  (PRILOSEC) 40 MG capsule, Take 1 capsule (40 mg total) by mouth 2 (two) times daily before a meal. NEED APPOINTMENT FOR ADDITIONAL REFILLS, Disp: 60 capsule, Rfl: 0   OXYGEN , Inhale 4 L/min into the lungs continuous., Disp: , Rfl:    predniSONE  (DELTASONE ) 5 MG tablet, Take 8 tablets (40 mg total) by mouth daily for 3 days, THEN 6 tablets (30 mg total) daily for 3 days, THEN 4 tablets (20 mg total) daily for 3 days, THEN 2 tablets (10 mg total) daily for 3 days, THEN 1 tablet (5 mg total) daily for 3 days, THEN 0.5 tablets (2.5 mg total) daily for 3 days. Then Stop., Disp: 65 tablet, Rfl: 0   ticagrelor  (BRILINTA ) 60 MG TABS tablet, Take 1 tablet (60 mg total) by mouth 2 (two) times daily., Disp: 180 tablet, Rfl: 2   varenicline  (CHANTIX ) 1 MG tablet, Take 1 tablet (1 mg total) by mouth 2 (two) times daily., Disp: 60 tablet, Rfl: 3   azithromycin   (ZITHROMAX ) 250 MG tablet, Take 1 tablet (250 mg total) by mouth daily., Disp: 30 tablet, Rfl: 5   nicotine  (NICODERM CQ  - DOSED IN MG/24 HOURS) 21 mg/24hr patch, Place 1 patch (21 mg total) onto the skin daily. (Patient not taking: Reported on 06/25/2024), Disp: 28 patch, Rfl: 0 [2]  Allergies Allergen Reactions   Avocado Anaphylaxis   Latex Shortness Of Breath and Rash   Codeine  Nausea Only

## 2024-06-29 ENCOUNTER — Other Ambulatory Visit: Payer: Self-pay

## 2024-07-01 ENCOUNTER — Other Ambulatory Visit: Payer: Self-pay | Admitting: Pulmonary Disease

## 2024-07-01 ENCOUNTER — Telehealth: Payer: Self-pay

## 2024-07-01 ENCOUNTER — Other Ambulatory Visit (HOSPITAL_COMMUNITY): Payer: Self-pay | Admitting: Emergency Medicine

## 2024-07-01 ENCOUNTER — Other Ambulatory Visit: Payer: Self-pay

## 2024-07-01 DIAGNOSIS — J441 Chronic obstructive pulmonary disease with (acute) exacerbation: Secondary | ICD-10-CM

## 2024-07-01 DIAGNOSIS — R0602 Shortness of breath: Secondary | ICD-10-CM

## 2024-07-01 DIAGNOSIS — J4489 Other specified chronic obstructive pulmonary disease: Secondary | ICD-10-CM

## 2024-07-01 NOTE — Telephone Encounter (Signed)
-----   Message from Nurse Saturnino BROCKS, RN sent at 07/01/2024 12:35 PM EST ----- Regarding: FW: Pulmonary rehab Jackquline Branca,  Hoping you could help  me with this or point me in the right direction. Pulmonary rehab referral placed during this pt inpatient stay.  Unable to use as the referring provider will not continue to see and manage this pt.  Reached out to Dr Kara who she saw in follow up on 12/11 earlier this week asking him to place the pulmonary rehab referral.  I see that my message was read 12/16 but no response or referral placed.  Is there a better way to communicate with him??  Thanks for any help!!! Saturnino Bernett PEAK, BSN Cardiac and Pulmonary Rehab Nurse Navigator ----- Message ----- From: Bernett Saturnino NOVAK, RN Sent: 06/29/2024   2:21 PM EST To: Dorn NOVAK Kara, MD Subject: Pulmonary rehab                                  Dr. Kara   The referral that was placed for the above pt to participate in pulmonary rehab, was placed by the hospitalist.  Unable to use that referral as this provider will not continue to see this pt on an outpatient basis.   Completed post hospitalization follow up with you on 12/11. Please place referral for pulmonary rehab with the diagnosis of asthma- COPD overlap.  I do not see any full PFT to substantiate COPD diagnosis.   Thank you so much for your support of pulmonary rehab Saturnino Bernett RN, BSN Cardiac and Pulmonary Rehab Nurse Navigator

## 2024-07-01 NOTE — Progress Notes (Signed)
 Paramedicine Encounter    Patient ID: [name] [name], female    DOB: [birth year], 68 y.o.   MRN: [id number]   Complaints NONE  Assessment A&O x 4, skin W&D w/ good color.  She denies chest pain and has baseline SOB.  Lung sounds clear in the upper lobes.  Coarse ronchi in lower lobes- worse in rt lower lobe than left.  Has a productive cough with thick white secretions.  No fever.  Compliance with meds No- missing evening doses.    Pill box filled Filled by daugther  Refills needed NONE  Meds changes since last visit NONE    Social changes NONE   BP 120/70 (BP Location: Left Arm, Patient Position: Sitting, Cuff Size: Normal)   Pulse 70   Resp 18   Wt 165 lb (74.8 kg)   SpO2 99% Comment: w/ 4lpm O2 via cannula  BMI 31.18 kg/m  Weight yesterday- Last visit weight-166lb  During today's visit pt presents w/ a junkie productive cough with thick white secretions.  She is taking Tussin DM but has only been taking it at night.  Per instructions on the bottle she can take every 4 hours.    Encouraged her to spit out the sputum she she coughs it up.  Also discussed reaching out to her PCP for further evaluation for this complaint.  ACTION: Home visit completed  [name] [phone] 07/01/2024  Patient Care Team: Vicci Barnie [name], MD as PCP - General (Internal Medicine) Shaaron Lamar HERO, MD as Consulting Physician (Gastroenterology) Onesimo Emaline Brink, MD as Consulting Physician (Hematology) Collene Reginia BRAVO, [place] as Physician Assistant (Physician Assistant) Carolee Heron [name], RN as Case Manager  Patient Active Problem List   Diagnosis Date Noted   (HFpEF) heart failure with preserved ejection fraction (HCC) 06/18/2024   Non-insulin  dependent type 2 diabetes mellitus (HCC) 06/18/2024   History of depression 06/18/2024   Diabetic neuropathy (HCC) 06/18/2024   Hypokalemia 01/08/2024   Acute on chronic heart failure with preserved ejection fraction (HFpEF, >=  50%) (HCC) 12/05/2023   At risk for polypharmacy 12/04/2023   Long-term current use of antidepressant 11/17/2023   Long-term current use of benzodiazepine 11/17/2023   Sigmoid diverticulitis 11/12/2023   Acute kidney injury superimposed on chronic kidney disease 11/12/2023   CKD (chronic kidney disease) stage 2, GFR 60-89 ml/min 11/12/2023   Sepsis due to pneumonia (HCC) 11/12/2023   Acute on chronic hypoxic respiratory failure (HCC) 10/05/2023   Pneumothorax, right 09/16/2023   COPD with acute exacerbation (HCC) 09/01/2023   Chronic hypoxic respiratory failure (HCC) 09/01/2023   Continuous dependence on cigarette smoking 09/01/2023   History of CAD (coronary artery disease) 09/01/2023   CAP (community acquired pneumonia) 09/01/2023   Full code status 06/14/2023   Acute hypoxic respiratory failure (HCC) 06/13/2023   Acute exacerbation of chronic obstructive pulmonary disease (COPD) (HCC) 06/12/2023   Nicotine  dependence 05/03/2023   Substance abuse (HCC) 09/10/2022   Iron  deficiency anemia 11/02/2021   Major depressive disorder, recurrent, in full remission with anxious distress 05/31/2021   Chronic neck pain 02/26/2021   GAD (generalized anxiety disorder) 01/08/2021   Esophageal dysphagia 04/28/2019   Bipolar disorder (HCC) 09/17/2018   Insomnia 10/28/2017   Prediabetes 05/28/2017   QT prolongation 03/25/2017   Psoriasis 06/22/2015   COPD (chronic obstructive pulmonary disease) (HCC)GOLD E 05/18/2015   Anxiety and depression 07/10/2013   Essential hypertension 06/07/2013   Hyperlipidemia 06/07/2013   Asthma, chronic 06/07/2013   Emphysema lung (HCC) 06/07/2013  Chronic back pain 06/07/2013   CAD S/P percutaneous coronary angioplasty 06/07/2013   GERD (gastroesophageal reflux disease) 06/07/2013   Breast lump on left side at 3 o'clock position 10/13/2012   S/P abdominal hysterectomy and right salpingo-oophorectomy 08/29/2011   COPD exacerbation (HCC) 09/15/2009   TOBACCO  ABUSE 07/17/2009   Chronic rhinitis 07/17/2009   Lung nodule < 6cm on CT 07/17/2009   Hypothyroidism 12/22/2008   Chronic depression 12/22/2008   Current Medications[1] Allergies[2]   Social History   Socioeconomic History   Marital status: Divorced    Spouse name: Not on file   Number of children: 1   Years of education: Not on file   Highest education level: 10th grade  Occupational History   Not on file  Tobacco Use   Smoking status: Every Day    Current packs/day: 0.50    Average packs/day: 0.5 packs/day for 50.0 years (25.0 ttl pk-yrs)    Types: Cigarettes   Smokeless tobacco: Never  Vaping Use   Vaping status: Some Days   Substances: Nicotine   Substance and Sexual Activity   Alcohol use: Yes    Comment: once a week   Drug use: Yes    Types: Marijuana    Comment: history of cocaine use, been about a year, per patient (12/18/21)   Sexual activity: Not Currently    Birth control/protection: Surgical  Other Topics Concern   Not on file  Social History Narrative   Not on file   Social Drivers of Health   Tobacco Use: High Risk (06/24/2024)   Patient History    Smoking Tobacco Use: Every Day    Smokeless Tobacco Use: Never    Passive Exposure: Not on file  Financial Resource Strain: Medium Risk (06/18/2024)   Overall Financial Resource Strain (CARDIA)    Difficulty of Paying Living Expenses: Somewhat hard  Food Insecurity: No Food Insecurity (06/23/2024)   Epic    Worried About Programme Researcher, Broadcasting/film/video in the Last Year: Never true    [employer] of Food in the Last Year: Never true  Recent Concern: Food Insecurity - Food Insecurity Present (06/18/2024)   Epic    Worried About Programme Researcher, Broadcasting/film/video in the Last Year: Sometimes true    [employer] of Food in the Last Year: Sometimes true  Transportation Needs: No Transportation Needs (06/23/2024)   Epic    Lack of Transportation (Medical): No    Lack of Transportation (Non-Medical): No  Physical Activity: Inactive  (06/18/2024)   Exercise Vital Sign    Days of Exercise per Week: 0 days    Minutes of Exercise per Session: Not on file  Stress: No Stress Concern Present (06/18/2024)   [employer] of Occupational Health - Occupational Stress Questionnaire    Feeling of Stress: Only a little  Social Connections: Moderately Isolated (06/18/2024)   Social Connection and Isolation Panel    Frequency of Communication with Friends and Family: More than three times a week    Frequency of Social Gatherings with Friends and Family: Once a week    Attends Religious Services: Never    Database Administrator or Organizations: No    Attends Engineer, Structural: 1 to 4 times per year    Marital Status: Divorced  Intimate Partner Violence: Not At Risk (06/23/2024)   Epic    Fear of Current or Ex-Partner: No    Emotionally Abused: No    Physically Abused: No    Sexually Abused: No  Depression (PHQ2-9): Medium Risk (05/18/2024)   Depression (PHQ2-9)    PHQ-2 Score: 9  Alcohol Screen: Low Risk (06/18/2024)   Alcohol Screen    Last Alcohol Screening Score (AUDIT): 1  Housing: Low Risk (06/23/2024)   Epic    Unable to Pay for Housing in the Last Year: No    Number of Times Moved in the Last Year: 0    Homeless in the Last Year: No  Utilities: Not At Risk (06/23/2024)   Epic    Threatened with loss of utilities: No  Health Literacy: Inadequate Health Literacy (08/07/2023)   B1300 Health Literacy    Frequency of need for help with medical instructions: Always    Physical Exam      Future Appointments  Date Time Provider Department Center  07/02/2024  2:00 PM Jaycee Greig PARAS, NP [facility] [address]  07/13/2024  4:00 PM Floretta Mallard, MD [facility]  08/11/2024  2:45 PM Kara Dorn NOVAK, MD [facility] [address]  05/06/2025  1:30 PM [facility] None  05/06/2025  2:00 PM Onesimo Emaline Brink, MD [facility] None           [1]  Current Outpatient Medications:     acetaminophen  (TYLENOL ) 500 MG tablet, Take 1 tablet (500 mg total) by mouth every 6 (six) hours as needed for mild pain (pain score 1-3), fever or headache., Disp: , Rfl:    albuterol  (VENTOLIN  HFA) 108 (90 Base) MCG/ACT inhaler, INHALE 2 PUFFS into THE lungs EVERY 6 HOURS AS NEEDED wheezing AND SHORTNESS OF BREATH, Disp: 54 g, Rfl: 1   alendronate  (FOSAMAX ) 70 MG tablet, Take 1 tablet (70 mg total) by mouth every 7 (seven) days. Take with a full glass of water on an empty stomach., Disp: 12 tablet, Rfl: 0   aspirin  EC 81 MG tablet, Take 81 mg by mouth in the morning., Disp: , Rfl:    atorvastatin  (LIPITOR ) 80 MG tablet, Take 1 tablet (80 mg total) by mouth daily., Disp: 90 tablet, Rfl: 3   azithromycin  (ZITHROMAX ) 250 MG tablet, Take 1 tablet (250 mg total) by mouth daily., Disp: 30 tablet, Rfl: 5   benzonatate  (TESSALON ) 100 MG capsule, Take 1 capsule (100 mg total) by mouth 3 (three) times daily as needed for cough., Disp: 20 capsule, Rfl: 0   busPIRone  (BUSPAR ) 15 MG tablet, Take 2 tablets (30 mg total) by mouth in the morning AND 1 tablet (15 mg total) daily at 12 noon AND 1 tablet (15 mg total) every evening. (Patient taking differently: Take 60mg  by mouth at bedtime), Disp: 360 tablet, Rfl: 0   clonazePAM  (KLONOPIN ) 0.5 MG tablet, Take 1 tablet (0.5 mg total) by mouth 2 (two) times daily as needed. (Patient taking differently: Take 0.5 mg by mouth 2 (two) times daily.), Disp: 60 tablet, Rfl: 0   doxepin  (SINEQUAN ) 75 MG capsule, Take 1 capsule (75 mg total) by mouth at bedtime., Disp: 90 capsule, Rfl: 0   Dupilumab  (DUPIXENT ) 300 MG/2ML SOAJ, Inject 300 mg into the skin every 14 (fourteen) days., Disp: 4 mL, Rfl: 3   escitalopram  (LEXAPRO ) 20 MG tablet, Take 1 tablet (20 mg total) by mouth daily., Disp: 90 tablet, Rfl: 0   ezetimibe  (ZETIA ) 10 MG tablet, Take 1 tablet (10 mg total) by mouth daily., Disp: 90 tablet, Rfl: 3   Fluticasone -Umeclidin-Vilant (TRELEGY ELLIPTA ) 200-62.5-25 MCG/ACT AEPB,  Inhale 1 puff into the lungs daily., Disp: 60 each, Rfl: 11   furosemide  (LASIX ) 40 MG tablet, Take  1 tablet (40 mg total) by mouth daily., Disp: 90 tablet, Rfl: 0   gabapentin  (NEURONTIN ) 600 MG tablet, Take 1 tablet (600 mg total) by mouth 2 (two) times daily., Disp: 180 tablet, Rfl: 2   hydrOXYzine  (ATARAX ) 25 MG tablet, Take 1 tablet (25 mg total) by mouth at bedtime as needed. (Patient taking differently: Take 25 mg by mouth every evening.), Disp: 30 tablet, Rfl: 2   ipratropium-albuterol  (DUONEB) 0.5-2.5 (3) MG/3ML SOLN, Take 3 mLs by nebulization every 8 (eight) hours., Disp: 90 mL, Rfl: 1   levothyroxine  (SYNTHROID ) 112 MCG tablet, Take 1 tablet (112 mcg total) by mouth daily., Disp: 90 tablet, Rfl: 2   metFORMIN  (GLUCOPHAGE ) 500 MG tablet, Take 1 tablet (500 mg total) by mouth 2 (two) times daily with a meal. (Patient taking differently: Take 500 mg by mouth daily with breakfast.), Disp: 180 tablet, Rfl: 1   mirtazapine  (REMERON ) 45 MG tablet, Take 1 tablet (45 mg total) by mouth at bedtime., Disp: 90 tablet, Rfl: 0   montelukast  (SINGULAIR ) 10 MG tablet, Take 1 tablet (10 mg total) by mouth at bedtime., Disp: 90 tablet, Rfl: 2   nebivolol  (BYSTOLIC ) 10 MG tablet, Take 1 tablet (10 mg total) by mouth daily., Disp: 90 tablet, Rfl: 3   nicotine  (NICODERM CQ  - DOSED IN MG/24 HOURS) 21 mg/24hr patch, Place 1 patch (21 mg total) onto the skin daily. (Patient not taking: Reported on 06/25/2024), Disp: 28 patch, Rfl: 0   nitroGLYCERIN  (NITROSTAT ) 0.4 MG SL tablet, Place 1 tablet (0.4 mg total) under the tongue every 5 (five) minutes as needed for chest pain., Disp: 25 tablet, Rfl: 3   OHTUVAYRE  3 MG/2.5ML SUSP, Take 1 vial by nebulization in the morning and at bedtime., Disp: , Rfl:    omeprazole  (PRILOSEC) 40 MG capsule, Take 1 capsule (40 mg total) by mouth 2 (two) times daily before a meal. NEED APPOINTMENT FOR ADDITIONAL REFILLS, Disp: 60 capsule, Rfl: 0   OXYGEN , Inhale 4 L/min into the lungs  continuous., Disp: , Rfl:    predniSONE  (DELTASONE ) 5 MG tablet, Take 8 tablets (40 mg total) by mouth daily for 3 days, THEN 6 tablets (30 mg total) daily for 3 days, THEN 4 tablets (20 mg total) daily for 3 days, THEN 2 tablets (10 mg total) daily for 3 days, THEN 1 tablet (5 mg total) daily for 3 days, THEN 0.5 tablets (2.5 mg total) daily for 3 days. Then Stop., Disp: 65 tablet, Rfl: 0   ticagrelor  (BRILINTA ) 60 MG TABS tablet, Take 1 tablet (60 mg total) by mouth 2 (two) times daily., Disp: 180 tablet, Rfl: 2   varenicline  (CHANTIX ) 1 MG tablet, Take 1 tablet (1 mg total) by mouth 2 (two) times daily., Disp: 60 tablet, Rfl: 3 [2]  Allergies Allergen Reactions   Avocado Anaphylaxis   Latex Shortness Of Breath and Rash   Codeine  Nausea Only

## 2024-07-01 NOTE — Telephone Encounter (Signed)
 Dr. Kara, please advise if okay to place pulmonary rehab with [facility] as dx?

## 2024-07-02 ENCOUNTER — Other Ambulatory Visit (HOSPITAL_COMMUNITY): Payer: Self-pay

## 2024-07-02 ENCOUNTER — Other Ambulatory Visit: Payer: Self-pay

## 2024-07-02 ENCOUNTER — Ambulatory Visit (INDEPENDENT_AMBULATORY_CARE_PROVIDER_SITE_OTHER)

## 2024-07-02 ENCOUNTER — Encounter: Payer: Self-pay | Admitting: Family

## 2024-07-02 ENCOUNTER — Ambulatory Visit: Admitting: Family

## 2024-07-02 VITALS — BP 128/70 | HR 77 | Temp 98.2°F | Resp 20 | Ht 61.0 in | Wt 168.6 lb

## 2024-07-02 DIAGNOSIS — J441 Chronic obstructive pulmonary disease with (acute) exacerbation: Secondary | ICD-10-CM

## 2024-07-02 DIAGNOSIS — I2510 Atherosclerotic heart disease of native coronary artery without angina pectoris: Secondary | ICD-10-CM | POA: Diagnosis not present

## 2024-07-02 DIAGNOSIS — Z7985 Long-term (current) use of injectable non-insulin antidiabetic drugs: Secondary | ICD-10-CM

## 2024-07-02 DIAGNOSIS — A419 Sepsis, unspecified organism: Secondary | ICD-10-CM

## 2024-07-02 DIAGNOSIS — E119 Type 2 diabetes mellitus without complications: Secondary | ICD-10-CM

## 2024-07-02 DIAGNOSIS — J9621 Acute and chronic respiratory failure with hypoxia: Secondary | ICD-10-CM

## 2024-07-02 DIAGNOSIS — Z09 Encounter for follow-up examination after completed treatment for conditions other than malignant neoplasm: Secondary | ICD-10-CM

## 2024-07-02 DIAGNOSIS — E039 Hypothyroidism, unspecified: Secondary | ICD-10-CM | POA: Diagnosis not present

## 2024-07-02 DIAGNOSIS — E1141 Type 2 diabetes mellitus with diabetic mononeuropathy: Secondary | ICD-10-CM | POA: Diagnosis not present

## 2024-07-02 DIAGNOSIS — J189 Pneumonia, unspecified organism: Secondary | ICD-10-CM | POA: Diagnosis not present

## 2024-07-02 DIAGNOSIS — I5030 Unspecified diastolic (congestive) heart failure: Secondary | ICD-10-CM

## 2024-07-02 DIAGNOSIS — F17210 Nicotine dependence, cigarettes, uncomplicated: Secondary | ICD-10-CM | POA: Diagnosis not present

## 2024-07-02 DIAGNOSIS — K219 Gastro-esophageal reflux disease without esophagitis: Secondary | ICD-10-CM | POA: Diagnosis not present

## 2024-07-02 DIAGNOSIS — F418 Other specified anxiety disorders: Secondary | ICD-10-CM

## 2024-07-02 DIAGNOSIS — F419 Anxiety disorder, unspecified: Secondary | ICD-10-CM

## 2024-07-02 MED ORDER — TRELEGY ELLIPTA 200-62.5-25 MCG/ACT IN AEPB
1.0000 | INHALATION_SPRAY | Freq: Every day | RESPIRATORY_TRACT | 11 refills | Status: DC
  Filled 2024-07-02 (×2): qty 60, 30d supply, fill #0
  Filled 2024-08-01: qty 60, 30d supply, fill #1

## 2024-07-02 NOTE — Telephone Encounter (Signed)
 Spoke to patient's daughter, Tiffany(DPR) and advised her of need for PFT and pulm rehab.  Pulm rehab referral has been placed.  First available PFT is 08/12/2023 at 4:00. Pt is scheduled for f/u with Dr. Kara same day at 2:45. I offered to reschedule appt until after PFT and Tiffany declined. She would like to keep appt as planned.  Nothing further needed.

## 2024-07-02 NOTE — Telephone Encounter (Signed)
 Yes ok to order. She needs PFTs in order to have official diagnosis to qualify for pulm rehab as well.  Thanks, JD

## 2024-07-02 NOTE — Progress Notes (Signed)
 "   Patient ID: [name] [name], female    DOB: 12-Aug-1955  MRN: [id number]  CC: Hospital Discharge Follow-Up  Subjective: [name] [name] is a 68 y.o. female who presents for hospital discharge follow-up. She is accompanied by her daughter.   Her concerns today include:  - Patient seen on 06/17/2024 - 06/22/2024 (5 days) at [facility] for acute on chronic hypoxic respiratory failure due to COPD exacerbation and acute bronchitis and additional diagnoses. Doing well on medication regimen, no issues/concerns. States she doesn't feel her best today and denies red flag symptoms. Established with and recently seen by Pulmonology and follow-up appointment upcoming. She denies thoughts of self-harm, suicidal ideations, homicidal ideations. - Due for diabetic eye exam.  - Due for diabetic foot exam.   Patient Active Problem List   Diagnosis Date Noted   (HFpEF) heart failure with preserved ejection fraction (HCC) 06/18/2024   Non-insulin  dependent type 2 diabetes mellitus (HCC) 06/18/2024   History of depression 06/18/2024   Diabetic neuropathy (HCC) 06/18/2024   Hypokalemia 01/08/2024   Acute on chronic heart failure with preserved ejection fraction (HFpEF, >= 50%) (HCC) 12/05/2023   At risk for polypharmacy 12/04/2023   Long-term current use of antidepressant 11/17/2023   Long-term current use of benzodiazepine 11/17/2023   Sigmoid diverticulitis 11/12/2023   Acute kidney injury superimposed on chronic kidney disease 11/12/2023   CKD (chronic kidney disease) stage 2, GFR 60-89 ml/min 11/12/2023   Sepsis due to pneumonia (HCC) 11/12/2023   Acute on chronic hypoxic respiratory failure (HCC) 10/05/2023   Pneumothorax, right 09/16/2023   COPD with acute exacerbation (HCC) 09/01/2023   Chronic hypoxic respiratory failure (HCC) 09/01/2023   Continuous dependence on cigarette smoking 09/01/2023   History of CAD (coronary artery disease) 09/01/2023   CAP (community acquired pneumonia) 09/01/2023    Full code status 06/14/2023   Acute hypoxic respiratory failure (HCC) 06/13/2023   Acute exacerbation of chronic obstructive pulmonary disease (COPD) (HCC) 06/12/2023   Nicotine  dependence 05/03/2023   Substance abuse (HCC) 09/10/2022   Iron  deficiency anemia 11/02/2021   Major depressive disorder, recurrent, in full remission with anxious distress 05/31/2021   Chronic neck pain 02/26/2021   GAD (generalized anxiety disorder) 01/08/2021   Esophageal dysphagia 04/28/2019   Bipolar disorder (HCC) 09/17/2018   Insomnia 10/28/2017   Prediabetes 05/28/2017   QT prolongation 03/25/2017   Psoriasis 06/22/2015   COPD (chronic obstructive pulmonary disease) (HCC)GOLD E 05/18/2015   Anxiety and depression 07/10/2013   Essential hypertension 06/07/2013   Hyperlipidemia 06/07/2013   Asthma, chronic 06/07/2013   Emphysema lung (HCC) 06/07/2013   Chronic back pain 06/07/2013   CAD S/P percutaneous coronary angioplasty 06/07/2013   GERD (gastroesophageal reflux disease) 06/07/2013   Breast lump on left side at 3 o'clock position 10/13/2012   S/P abdominal hysterectomy and right salpingo-oophorectomy 08/29/2011   COPD exacerbation (HCC) 09/15/2009   TOBACCO ABUSE 07/17/2009   Chronic rhinitis 07/17/2009   Lung nodule < 6cm on CT 07/17/2009   Hypothyroidism 12/22/2008   Chronic depression 12/22/2008     Medications Ordered Prior to Encounter[1]  Allergies[2]  Social History   Socioeconomic History   Marital status: Divorced    Spouse name: Not on file   Number of children: 1   Years of education: Not on file   Highest education level: 10th grade  Occupational History   Not on file  Tobacco Use   Smoking status: Every Day    Current packs/day: 0.50    Average packs/day:  0.5 packs/day for 50.0 years (25.0 ttl pk-yrs)    Types: Cigarettes   Smokeless tobacco: Never  Vaping Use   Vaping status: Some Days   Substances: Nicotine   Substance and Sexual Activity   Alcohol use: Yes     Comment: once a week   Drug use: Yes    Types: Marijuana    Comment: history of cocaine use, been about a year, per patient (12/18/21)   Sexual activity: Not Currently    Birth control/protection: Surgical  Other Topics Concern   Not on file  Social History Narrative   Not on file   Social Drivers of Health   Tobacco Use: High Risk (07/02/2024)   Patient History    Smoking Tobacco Use: Every Day    Smokeless Tobacco Use: Never    Passive Exposure: Not on file  Financial Resource Strain: Medium Risk (06/18/2024)   Overall Financial Resource Strain (CARDIA)    Difficulty of Paying Living Expenses: Somewhat hard  Food Insecurity: No Food Insecurity (06/23/2024)   Epic    Worried About Programme Researcher, Broadcasting/film/video in the Last Year: Never true    Ran Out of Food in the Last Year: Never true  Recent Concern: Food Insecurity - Food Insecurity Present (06/18/2024)   Epic    Worried About Programme Researcher, Broadcasting/film/video in the Last Year: Sometimes true    Ran Out of Food in the Last Year: Sometimes true  Transportation Needs: No Transportation Needs (06/23/2024)   Epic    Lack of Transportation (Medical): No    Lack of Transportation (Non-Medical): No  Physical Activity: Inactive (06/18/2024)   Exercise Vital Sign    Days of Exercise per Week: 0 days    Minutes of Exercise per Session: Not on file  Stress: No Stress Concern Present (06/18/2024)   [employer] of Occupational Health - Occupational Stress Questionnaire    Feeling of Stress: Only a little  Social Connections: Moderately Isolated (06/18/2024)   Social Connection and Isolation Panel    Frequency of Communication with Friends and Family: More than three times a week    Frequency of Social Gatherings with Friends and Family: Once a week    Attends Religious Services: Never    Database Administrator or Organizations: No    Attends Banker Meetings: 1 to 4 times per year    Marital Status: Divorced  Intimate Partner Violence:  Not At Risk (06/23/2024)   Epic    Fear of Current or Ex-Partner: No    Emotionally Abused: No    Physically Abused: No    Sexually Abused: No  Depression (PHQ2-9): Low Risk (07/02/2024)   Depression (PHQ2-9)    PHQ-2 Score: 0  Recent Concern: Depression (PHQ2-9) - Medium Risk (05/18/2024)   Depression (PHQ2-9)    PHQ-2 Score: 9  Alcohol Screen: Low Risk (06/18/2024)   Alcohol Screen    Last Alcohol Screening Score (AUDIT): 1  Housing: Low Risk (06/23/2024)   Epic    Unable to Pay for Housing in the Last Year: No    Number of Times Moved in the Last Year: 0    Homeless in the Last Year: No  Utilities: Not At Risk (06/23/2024)   Epic    Threatened with loss of utilities: No  Health Literacy: Inadequate Health Literacy (08/07/2023)   B1300 Health Literacy    Frequency of need for help with medical instructions: Always    Family History  Problem Relation Age of Onset  Heart disease Mother    Hypertension Mother    Stroke Mother    Mental illness Mother    Heart disease Father    Hypertension Father    Diabetes Father    Colon polyps Brother    Heart disease Maternal Grandfather    Cancer Paternal Grandmother        mouth   Anesthesia problems Daughter    Breast cancer Maternal Aunt    Throat cancer Maternal Uncle    Thyroid  disease Paternal Aunt    Hypotension Neg Hx    Malignant hyperthermia Neg Hx    Pseudochol deficiency Neg Hx    Colon cancer Neg Hx    Stomach cancer Neg Hx    Esophageal cancer Neg Hx    Pancreatic cancer Neg Hx    Rectal cancer Neg Hx     Past Surgical History:  Procedure Laterality Date   ABDOMINAL EXPLORATION SURGERY  1977   ABDOMINAL HYSTERECTOMY  1977   left one of my ovaries   BACK SURGERY     BIOPSY  05/27/2019   Procedure: BIOPSY;  Surgeon: Shaaron Lamar HERO, MD;  Location: [facility];  Service: Endoscopy;;   BREAST BIOPSY Left 11/2021   x2   COLONOSCOPY  2011   Dr. Teressa: Mild diverticulosis, descending diminutive colon  polyp (not retrieved), next colonoscopy 10 years   CORONARY ANGIOPLASTY WITH STENT PLACEMENT  2011 X2   regular stents didn't work; had to go back in in ~ 1 month and put in medicated stents   DILATION AND CURETTAGE OF UTERUS     ESOPHAGOGASTRODUODENOSCOPY     ESOPHAGOGASTRODUODENOSCOPY (EGD) WITH PROPOFOL  N/A 05/27/2019   normal esophagus, dilation, erosive gastropathy s/p biopsy, normal duodenum. Negative H.pylori.    LEFT HEART CATH AND CORONARY ANGIOGRAPHY N/A 03/26/2017   Procedure: LEFT HEART CATH AND CORONARY ANGIOGRAPHY;  Surgeon: Claudene Victory ORN, MD;  Location: [facility];  Service: Cardiovascular;  Laterality: N/A;   LUMBAR LAMINECTOMY/DECOMPRESSION MICRODISCECTOMY  08/05/2011   Procedure: LUMBAR LAMINECTOMY/DECOMPRESSION MICRODISCECTOMY;  Surgeon: Lynwood JONELLE Mill, MD;  Location: [facility]UR[facility] OR[facility];  Service: Neurosurgery;  Laterality: Right;  Right Lumbar four-five extraforaminal discectomy   MALONEY DILATION N/A 05/27/2019   Procedure: AGAPITO DILATION;  Surgeon: Shaaron Lamar HERO, MD;  Location: [facility];  Service: Endoscopy;  Laterality: N/A;  54   SHOULDER ARTHROSCOPY WITH ROTATOR CUFF REPAIR AND OPEN BICEPS TENODESIS Right 12/24/2021   Procedure: RIGHT SHOULDER ARTHROSCOPY WITH ROTATOR CUFF REPAIR AND BICEPS TENODESIS;  Surgeon: Barbarann Oneil BROCKS, MD;  Location: [facility] OR;  Service: Orthopedics;  Laterality: Right;   TONSILLECTOMY     as a child   UPPER GASTROINTESTINAL ENDOSCOPY      ROS: Review of Systems Negative except as stated above  PHYSICAL EXAM: BP 128/70   Pulse 77   Temp 98.2 F (36.8 C) (Oral)   Resp 20   Ht 5' 1 (1.549 m)   Wt 168 lb 9.6 oz (76.5 kg)   SpO2 95%   BMI 31.86 kg/m   Physical Exam HENT:     Head: Normocephalic and atraumatic.     Nose: Nose normal.     Mouth/Throat:     Mouth: Mucous membranes are moist.     Pharynx: Oropharynx is clear.  Eyes:     Extraocular Movements: Extraocular movements intact.     Conjunctiva/sclera:  Conjunctivae normal.     Pupils: Pupils are equal, round, and reactive to light.  Cardiovascular:  Rate and Rhythm: Normal rate and regular rhythm.     Pulses: Normal pulses.     Heart sounds: Normal heart sounds.  Pulmonary:     Effort: Pulmonary effort is normal.     Breath sounds: Normal breath sounds.     Comments: Mild wheezing upper bilateral lungs. Oxygen  present with patient. Musculoskeletal:        General: Normal range of motion.     Cervical back: Normal range of motion and neck supple.  Neurological:     General: No focal deficit present.     Mental Status: She is alert and oriented to person, place, and time.  Psychiatric:        Mood and Affect: Mood normal.        Behavior: Behavior normal.     ASSESSMENT AND PLAN: 1. Hospital discharge follow-up (Primary) - Reviewed hospital course, current medications, ensured proper follow-up in place, and addressed concerns.  - Routine screening.  - Basic Metabolic Panel - CBC  2. COPD with acute exacerbation (HCC) 3. Acute on chronic hypoxic respiratory failure (HCC) 4. Sepsis due to pneumonia (HCC) 5. Continuous dependence on cigarette smoking - Continue present management.  - DG Chest 2 View for evaluation.  - Keep all scheduled appointments with established Pulmonology.  - DG Chest 2 View; Future  6. CAD S/P percutaneous coronary angioplasty 7. Heart failure with preserved ejection fraction (HFpEF), unspecified HF chronicity (HCC) - Continue present management.  - Follow-up with primary provider as scheduled.   8. Non-insulin  dependent type 2 diabetes mellitus (HCC) - Hemoglobin A1c 6.5% on 06/12/2024. Goal 7%. - Continue present management.  - Follow-up with primary provider as scheduled.   9. Diabetic eye exam [facility]) - Referral to Ophthalmology for evaluation/management.  - Ambulatory referral to Ophthalmology  10. Encounter for diabetic foot exam [facility]) - Referral to Podiatry for evaluation/management.   - Ambulatory referral to Podiatry  11. Diabetic mononeuropathy associated with type 2 diabetes mellitus (HCC) - Continue present management.  - Follow-up with primary provider as scheduled.   12. Hypothyroidism, unspecified type - Continue present management.  - Follow-up with primary provider as scheduled.   13. Gastroesophageal reflux disease, unspecified whether esophagitis present - Continue present management.  - Follow-up with primary provider as scheduled.   14. Anxiety and depression - Patient denies thoughts of self-harm, suicidal ideations, homicidal ideations. - Continue present management.  - Follow-up with primary provider as scheduled.    Patient was given the opportunity to ask questions.  Patient verbalized understanding of the plan and was able to repeat key elements of the plan. Patient was given clear instructions to go to Emergency Department or return to medical center if symptoms don't improve, worsen, or new problems develop.The patient verbalized understanding.   Orders Placed This Encounter  Procedures   DG Chest 2 View   Basic Metabolic Panel   CBC   Ambulatory referral to Ophthalmology   Ambulatory referral to Podiatry    Return for Follow-Up or next available with Barnie Louder, MD.  Greig JINNY Chute, NP      [1]  Current Outpatient Medications on File Prior to Visit  Medication Sig Dispense Refill   acetaminophen  (TYLENOL ) 500 MG tablet Take 1 tablet (500 mg total) by mouth every 6 (six) hours as needed for mild pain (pain score 1-3), fever or headache.     albuterol  (VENTOLIN  HFA) 108 (90 Base) MCG/ACT inhaler INHALE 2 PUFFS into THE lungs EVERY 6 HOURS AS NEEDED wheezing  AND SHORTNESS OF BREATH 54 g 1   alendronate  (FOSAMAX ) 70 MG tablet Take 1 tablet (70 mg total) by mouth every 7 (seven) days. Take with a full glass of water on an empty stomach. 12 tablet 0   aspirin  EC 81 MG tablet Take 81 mg by mouth in the morning.     atorvastatin   (LIPITOR ) 80 MG tablet Take 1 tablet (80 mg total) by mouth daily. 90 tablet 3   azithromycin  (ZITHROMAX ) 250 MG tablet Take 1 tablet (250 mg total) by mouth daily. 30 tablet 5   benzonatate  (TESSALON ) 100 MG capsule Take 1 capsule (100 mg total) by mouth 3 (three) times daily as needed for cough. 20 capsule 0   busPIRone  (BUSPAR ) 15 MG tablet Take 2 tablets (30 mg total) by mouth in the morning AND 1 tablet (15 mg total) daily at 12 noon AND 1 tablet (15 mg total) every evening. (Patient taking differently: Take 60mg  by mouth at bedtime) 360 tablet 0   clonazePAM  (KLONOPIN ) 0.5 MG tablet Take 1 tablet (0.5 mg total) by mouth 2 (two) times daily as needed. (Patient taking differently: Take 0.5 mg by mouth 2 (two) times daily.) 60 tablet 0   doxepin  (SINEQUAN ) 75 MG capsule Take 1 capsule (75 mg total) by mouth at bedtime. 90 capsule 0   Dupilumab  (DUPIXENT ) 300 MG/2ML SOAJ Inject 300 mg into the skin every 14 (fourteen) days. 4 mL 3   escitalopram  (LEXAPRO ) 20 MG tablet Take 1 tablet (20 mg total) by mouth daily. 90 tablet 0   ezetimibe  (ZETIA ) 10 MG tablet Take 1 tablet (10 mg total) by mouth daily. 90 tablet 3   Fluticasone -Umeclidin-Vilant (TRELEGY ELLIPTA ) 200-62.5-25 MCG/ACT AEPB Inhale 1 puff into the lungs daily. 60 each 11   furosemide  (LASIX ) 40 MG tablet Take 1 tablet (40 mg total) by mouth daily. 90 tablet 0   gabapentin  (NEURONTIN ) 600 MG tablet Take 1 tablet (600 mg total) by mouth 2 (two) times daily. 180 tablet 2   hydrOXYzine  (ATARAX ) 25 MG tablet Take 1 tablet (25 mg total) by mouth at bedtime as needed. 30 tablet 2   mirtazapine  (REMERON ) 45 MG tablet Take 1 tablet (45 mg total) by mouth at bedtime. 90 tablet 0   montelukast  (SINGULAIR ) 10 MG tablet Take 1 tablet (10 mg total) by mouth at bedtime. 90 tablet 2   nebivolol  (BYSTOLIC ) 10 MG tablet Take 1 tablet (10 mg total) by mouth daily. 90 tablet 3   nitroGLYCERIN  (NITROSTAT ) 0.4 MG SL tablet Place 1 tablet (0.4 mg total) under the  tongue every 5 (five) minutes as needed for chest pain. 25 tablet 3   OHTUVAYRE  3 MG/2.5ML SUSP Take 1 vial by nebulization in the morning and at bedtime.     omeprazole  (PRILOSEC) 40 MG capsule Take 1 capsule (40 mg total) by mouth 2 (two) times daily before a meal. NEED APPOINTMENT FOR ADDITIONAL REFILLS 60 capsule 0   OXYGEN  Inhale 4 L/min into the lungs continuous.     predniSONE  (DELTASONE ) 5 MG tablet Take 8 tablets (40 mg total) by mouth daily for 3 days, THEN 6 tablets (30 mg total) daily for 3 days, THEN 4 tablets (20 mg total) daily for 3 days, THEN 2 tablets (10 mg total) daily for 3 days, THEN 1 tablet (5 mg total) daily for 3 days, THEN 0.5 tablets (2.5 mg total) daily for 3 days. Then Stop. 65 tablet 0   ticagrelor  (BRILINTA ) 60 MG TABS tablet Take 1 tablet (  60 mg total) by mouth 2 (two) times daily. 180 tablet 2   varenicline  (CHANTIX ) 1 MG tablet Take 1 tablet (1 mg total) by mouth 2 (two) times daily. 60 tablet 3   ipratropium-albuterol  (DUONEB) 0.5-2.5 (3) MG/3ML SOLN Take 3 mLs by nebulization every 8 (eight) hours. 90 mL 1   levothyroxine  (SYNTHROID ) 112 MCG tablet Take 1 tablet (112 mcg total) by mouth daily. 90 tablet 2   metFORMIN  (GLUCOPHAGE ) 500 MG tablet Take 1 tablet (500 mg total) by mouth 2 (two) times daily with a meal. (Patient taking differently: Take 500 mg by mouth daily with breakfast.) 180 tablet 1   nicotine  (NICODERM CQ  - DOSED IN MG/24 HOURS) 21 mg/24hr patch Place 1 patch (21 mg total) onto the skin daily. (Patient not taking: Reported on 07/01/2024) 28 patch 0   No current facility-administered medications on file prior to visit.  [2]  Allergies Allergen Reactions   Avocado Anaphylaxis   Latex Shortness Of Breath and Rash   Codeine  Nausea Only   "

## 2024-07-03 LAB — CBC
Hematocrit: 38.7 % (ref 34.0–46.6)
Hemoglobin: 11.9 g/dL (ref 11.1–15.9)
MCH: 28.6 pg (ref 26.6–33.0)
MCHC: 30.7 g/dL — ABNORMAL LOW (ref 31.5–35.7)
MCV: 93 fL (ref 79–97)
Platelets: 483 x10E3/uL — ABNORMAL HIGH (ref 150–450)
RBC: 4.16 x10E6/uL (ref 3.77–5.28)
RDW: 14.8 % (ref 11.7–15.4)
WBC: 17.4 x10E3/uL — ABNORMAL HIGH (ref 3.4–10.8)

## 2024-07-03 LAB — BASIC METABOLIC PANEL WITH GFR
BUN/Creatinine Ratio: 22 (ref 12–28)
BUN: 18 mg/dL (ref 8–27)
CO2: 26 mmol/L (ref 20–29)
Calcium: 9.1 mg/dL (ref 8.7–10.3)
Chloride: 93 mmol/L — ABNORMAL LOW (ref 96–106)
Creatinine, Ser: 0.81 mg/dL (ref 0.57–1.00)
Glucose: 235 mg/dL — ABNORMAL HIGH (ref 70–99)
Potassium: 4 mmol/L (ref 3.5–5.2)
Sodium: 142 mmol/L (ref 134–144)
eGFR: 79 mL/min/1.73

## 2024-07-05 ENCOUNTER — Encounter: Payer: Self-pay | Admitting: Hematology

## 2024-07-05 ENCOUNTER — Telehealth (HOSPITAL_COMMUNITY): Payer: Self-pay

## 2024-07-05 ENCOUNTER — Ambulatory Visit: Payer: Self-pay | Admitting: Family

## 2024-07-05 DIAGNOSIS — J9811 Atelectasis: Secondary | ICD-10-CM

## 2024-07-05 NOTE — Telephone Encounter (Signed)
 Called patient regarding pulmonary rehab, spoke with patient's daughter. She confirmed interest in program. Informed her we will call to get scheduled once January schedule opens.

## 2024-07-12 ENCOUNTER — Other Ambulatory Visit: Payer: Self-pay

## 2024-07-12 NOTE — Addendum Note (Signed)
 Addended by: Tiya Schrupp on: 07/12/2024 05:42 PM   Modules accepted: Orders

## 2024-07-13 ENCOUNTER — Ambulatory Visit
Attending: Student in an Organized Health Care Education/Training Program | Admitting: Student in an Organized Health Care Education/Training Program

## 2024-07-13 ENCOUNTER — Encounter: Payer: Self-pay | Admitting: Student in an Organized Health Care Education/Training Program

## 2024-07-13 ENCOUNTER — Other Ambulatory Visit: Payer: Self-pay

## 2024-07-13 ENCOUNTER — Other Ambulatory Visit (HOSPITAL_COMMUNITY): Payer: Self-pay

## 2024-07-13 VITALS — BP 102/58 | HR 72 | Ht 61.0 in | Wt 172.0 lb

## 2024-07-13 DIAGNOSIS — I10 Essential (primary) hypertension: Secondary | ICD-10-CM | POA: Diagnosis not present

## 2024-07-13 DIAGNOSIS — Z9861 Coronary angioplasty status: Secondary | ICD-10-CM

## 2024-07-13 DIAGNOSIS — I5030 Unspecified diastolic (congestive) heart failure: Secondary | ICD-10-CM

## 2024-07-13 DIAGNOSIS — I2510 Atherosclerotic heart disease of native coronary artery without angina pectoris: Secondary | ICD-10-CM

## 2024-07-13 DIAGNOSIS — E782 Mixed hyperlipidemia: Secondary | ICD-10-CM

## 2024-07-13 LAB — PRO B NATRIURETIC PEPTIDE: NT-Pro BNP: 462 pg/mL — ABNORMAL HIGH (ref 0–301)

## 2024-07-13 MED ORDER — TORSEMIDE 20 MG PO TABS
40.0000 mg | ORAL_TABLET | Freq: Every day | ORAL | 3 refills | Status: DC
  Filled 2024-07-13: qty 180, 90d supply, fill #0

## 2024-07-13 MED ORDER — EMPAGLIFLOZIN 10 MG PO TABS
10.0000 mg | ORAL_TABLET | Freq: Every day | ORAL | 3 refills | Status: AC
  Filled 2024-07-13: qty 90, 90d supply, fill #0

## 2024-07-13 NOTE — Assessment & Plan Note (Signed)
-   Recent LDL at goal.  No changes.

## 2024-07-13 NOTE — Patient Instructions (Signed)
 Medication Instructions:  STOP Lasix    START Torsemide 40 mg daily  START Jardiance 10 mg daily   *If you need a refill on your cardiac medications before your next appointment, please call your pharmacy*  Lab Work: Probnp  If you have labs (blood work) drawn today and your tests are completely normal, you will receive your results only by: MyChart Message (if you have MyChart) OR A paper copy in the mail If you have any lab test that is abnormal or we need to change your treatment, we will call you to review the results.   Follow-Up: At [facility], you and your health needs are our priority.  As part of our continuing mission to provide you with exceptional heart care, our providers are all part of one team.  This team includes your primary Cardiologist (physician) and Advanced Practice Providers or APPs (Physician Assistants and Nurse Practitioners) who all work together to provide you with the care you need, when you need it.  Your next appointment:   1 month(s)  Provider:   Georganna Archer, MD

## 2024-07-13 NOTE — Assessment & Plan Note (Signed)
BP is at goal.  No changes.

## 2024-07-13 NOTE — Assessment & Plan Note (Signed)
-   The patient has pretty extensive CAD including a CTO of her RCA and prior PCI. - Most recent LHC was in 2018 which showed extensive CAD, but was stable. - Ultimately her medical management has to be aggressive for secondary prevention. - She is currently on aspirin  and ticagrelor  indefinitely; however, the data for indefinite DAPT is not super strong.  Furthermore, ticagrelor  can be associated with dyspnea.  I think for now it is reasonable to continue DAPT given that she does not have any Creacy high bleeding risk.  But if her symptoms of shortness of breath do not improve with diuresis and COPD management, switching to Plavix may be reasonable. Continue aspirin  81 mg daily Continue ticagrelor  60 mg twice daily for now

## 2024-07-13 NOTE — Assessment & Plan Note (Signed)
-   Patient recently diagnosed with COPD exacerbation and also noted to be volume up.  Most recent echocardiogram demonstrates preserved systolic function. - She clinically does not appear volume up; however, her abdomen is distended and her weight is up.  This coupled with her symptoms suggest that she is currently volume overloaded. - Ultimately I think it will be hard to tease out whether her symptoms are related to her lung disease versus her heart; however, I think that increasing her diuretics makes sense given her physical exam. - If I am unsuccessful in improving her symptoms with diuresis she may ultimately need a right heart catheterization to further characterize her filling pressures. - For now we will switch her from Lasix  to torsemide and also start Jardiance to reduce heart failure hospitalization/mortality. Stop Lasix  Start torsemide 40 mg daily Start Jardiance 10 mg daily Check NT proBNP Follow-up in 1 month

## 2024-07-13 NOTE — Progress Notes (Signed)
 " Cardiology Office Note:   Date:  07/13/2024  ID:  [name] [name], DOB 10-10-1955, MRN [id number] PCP: Vicci Barnie [name], MD  [facility] Providers Cardiologist:  Georganna Archer, MD { Chief Complaint:  Chief Complaint  Patient presents with   Shortness of Breath      History of Present Illness:   [name] [name] is a 68 y.o. female with a PMH of CAD s/p multiple PCIs (most recent 2011), HFpEF, HTN, HLD, DM2, hypothyroidism, COPD w/ chronic hypoxic respiratory failure (on 3-4L), tobacco use disorder who presents for follow up.  The patient was recently hospitalized earlier this month for an acute on chronic COPD exacerbation/she was treated with steroids and empiric antibiotics with improvement in her symptoms.  Since being discharged however, she continues to have ongoing SOB.  She reports a 20 pound weight gain over the past several months despite adherence to her diuretics.  Furthermore, she reports increased shortness of breath upon bending over consistent with bendopnea.  She further endorses swelling in her abdomen, and states that she does not typically have peripheral swelling.  She states that she is does good urine output on her 40 mg daily, but it is inconsistent.  She is taking all of her medications as prescribed.  She is taking 3 extra doses on 3 separate occasions of 20 mg on top of her 40 without additional benefit.  She continues to smoke roughly 8 cigarettes daily.  Denies chest pain, PND, orthopnea, syncope and presyncope.   Past Medical History:  Diagnosis Date   Allergy    seasonal   Anemia    Anxiety    takes Lexapro  daily   Arthritis    back, from neck down pass my bra area (03/25/2017)   Asthma    Bartholin gland cyst 08/29/2011   Bruises easily    pt is on Effient    Chronic back pain    herniated nucleus pulposus   Chronic back pain    neck to bra area; lower back (03/25/2017)   Chronic kidney disease    recurrent UTI's this year 2022   COPD  (chronic obstructive pulmonary disease) (HCC)    early stages   Coronary artery disease    Depression    takes Klonopin  daily   Diabetes mellitus without complication (HCC)    Diverticulosis    Fibroadenoma of left breast    GERD (gastroesophageal reflux disease)    takes Nexium  daily   H/O hiatal hernia    Heart attack (HCC) 2011   Hemorrhoids    Hernia    Hyperlipidemia    takes Lipitor  daily   Hypertension    takes Losartan  daily and Labetalol  bid   Hypothyroidism    takes Synthroid  daily   Insomnia    hydroxyzine  prn   Joint pain    Pneumonia    couple times (03/25/2017)   Pre-diabetes    just found out 1 wk ago (03/25/2017)   Psoriasis    elbows,knees,back   Shortness of breath    with exertion   Slowing of urinary stream    Stress incontinence      Studies Reviewed:    EKG: No new ECG       Cardiac Studies & Procedures   ______________________________________________________________________________________________   STRESS TESTS  NM PET CT CARDIAC PERFUSION MULTI W/ABSOLUTE BLOODFLOW 08/20/2023  Narrative   LV perfusion is normal. There is no evidence of ischemia. There is no evidence of infarction.   Rest left ventricular  function is normal. Rest EF: 68%. Stress left ventricular function is normal. Stress EF: 75%. End diastolic cavity size is normal. End systolic cavity size is normal.   Myocardial blood flow was computed to be 0.70ml/g/min at rest and 1.72ml/g/min at stress. Global myocardial blood flow reserve was 2.26 and was normal.   Coronary calcium  assessment not performed due to prior revascularization. Not commented on due to multiple prior stents   The study is normal. The study is low risk.  CLINICAL DATA:  This over-read does not include interpretation of cardiac or coronary anatomy or pathology. The Cardiac PET CT interpretation by the cardiologist is attached.  COMPARISON:  02/07/2023  FINDINGS: Cardiovascular: Aortic  atherosclerosis. Normal heart size. Three-vessel coronary artery calcifications. No pericardial effusion.  Limited Mediastinum/Nodes: No enlarged mediastinal, hilar, or axillary lymph nodes. Trachea and esophagus demonstrate no significant findings.  Limited Lungs/Pleura: Bandlike bibasilar scarring or atelectasis. No pleural effusion or pneumothorax.  Upper Abdomen: No acute abnormality.  Musculoskeletal: No chest wall abnormality. No acute osseous findings.  IMPRESSION: 1. No acute CT findings of the included chest. 2. Coronary artery disease.  Aortic Atherosclerosis (ICD10-I70.0).   Electronically Signed By: Marolyn JONETTA Jaksch M.D. On: 08/20/2023 13:07   ECHOCARDIOGRAM  ECHOCARDIOGRAM COMPLETE 06/13/2024  Narrative ECHOCARDIOGRAM REPORT    Patient Name:   [name] [name] Date of Exam: 06/13/2024 Medical Rec #:  [id number]     Height:       61.0 in Accession #:    [id number]    Weight:       166.0 lb Date of Birth:  19-Dec-1955     BSA:          1.745 m Patient Age:    67 years      BP:           164/86 mmHg Patient Gender: F             HR:           76 bpm. Exam Location:  Inpatient  Procedure: 2D Echo (Both Spectral and Color Flow Doppler were utilized during procedure).  Indications:    abnormal ecg  History:        Patient has prior history of Echocardiogram examinations, most recent 12/05/2023. CAD, COPD and chronic kidney disease; Risk Factors:Current Smoker, Hypertension and Dyslipidemia.  Sonographer:    Tinnie Barefoot RDCS Referring Phys: [id number] TERRY SAILOR HALL   Sonographer Comments: Image acquisition challenging due to respiratory motion. IMPRESSIONS   1. Left ventricular ejection fraction, by estimation, is 65 to 70%. The left ventricle has normal function. The left ventricle has no regional wall motion abnormalities. Left ventricular diastolic parameters were normal. 2. Right ventricular systolic function is normal. The right ventricular size is  normal. Tricuspid regurgitation signal is inadequate for assessing PA pressure. 3. The mitral valve is normal in structure. Trivial mitral valve regurgitation. No evidence of mitral stenosis. 4. The aortic valve is normal in structure. Aortic valve regurgitation is not visualized. No aortic stenosis is present. 5. The inferior vena cava is dilated in size with >50% respiratory variability, suggesting right atrial pressure of 8 mmHg.  FINDINGS Left Ventricle: Left ventricular ejection fraction, by estimation, is 65 to 70%. The left ventricle has normal function. The left ventricle has no regional wall motion abnormalities. The left ventricular internal cavity size was normal in size. There is no left ventricular hypertrophy. Left ventricular diastolic parameters were normal. Normal left ventricular filling pressure.  Right Ventricle: The right ventricular  size is normal. No increase in right ventricular wall thickness. Right ventricular systolic function is normal. Tricuspid regurgitation signal is inadequate for assessing PA pressure.  Left Atrium: Left atrial size was normal in size.  Right Atrium: Right atrial size was normal in size.  Pericardium: There is no evidence of pericardial effusion.  Mitral Valve: The mitral valve is normal in structure. Trivial mitral valve regurgitation. No evidence of mitral valve stenosis.  Tricuspid Valve: The tricuspid valve is normal in structure. Tricuspid valve regurgitation is not demonstrated. No evidence of tricuspid stenosis.  Aortic Valve: The aortic valve is normal in structure. Aortic valve regurgitation is not visualized. No aortic stenosis is present.  Pulmonic Valve: The pulmonic valve was normal in structure. Pulmonic valve regurgitation is not visualized. No evidence of pulmonic stenosis.  Aorta: The aortic root is normal in size and structure.  Venous: The inferior vena cava is dilated in size with greater than 50% respiratory  variability, suggesting right atrial pressure of 8 mmHg.  IAS/Shunts: The interatrial septum appears to be lipomatous. No atrial level shunt detected by color flow Doppler.   LEFT VENTRICLE PLAX 2D LVIDd:         4.50 cm   Diastology LVIDs:         3.10 cm   LV e' medial:    8.27 cm/s LV PW:         0.80 cm   LV E/e' medial:  12.0 LV IVS:        0.90 cm   LV e' lateral:   10.80 cm/s LVOT diam:     1.60 cm   LV E/e' lateral: 9.2 LV SV:         52 LV SV Index:   30 LVOT Area:     2.01 cm   RIGHT VENTRICLE             IVC RV Basal diam:  2.50 cm     IVC diam: 2.30 cm RV S prime:     14.70 cm/s TAPSE (M-mode): 2.6 cm  LEFT ATRIUM             Index        RIGHT ATRIUM           Index LA diam:        3.60 cm 2.06 cm/m   RA Area:     12.60 cm LA Vol (A2C):   52.9 ml 30.32 ml/m  RA Volume:   29.30 ml  16.79 ml/m LA Vol (A4C):   44.5 ml 25.50 ml/m LA Biplane Vol: 48.5 ml 27.79 ml/m AORTIC VALVE LVOT Vmax:   114.00 cm/s LVOT Vmean:  79.200 cm/s LVOT VTI:    0.258 m  AORTA Ao Root diam: 2.60 cm  MITRAL VALVE MV Area (PHT): 4.31 cm    SHUNTS MV Decel Time: 176 msec    Systemic VTI:  0.26 m MV E velocity: 99.40 cm/s  Systemic Diam: 1.60 cm MV A velocity: 98.80 cm/s MV E/A ratio:  1.01  Wilbert Bihari MD Electronically signed by Wilbert Bihari MD Signature Date/Time: 06/13/2024/11:48:20 AM    Final          ______________________________________________________________________________________________      Risk Assessment/Calculations:              Physical Exam:     VS:  BP (!) 102/58 (BP Location: Left Arm, Patient Position: Sitting, Cuff Size: Large)   Pulse 72   Ht 5' 1 (1.549 m)  Wt 172 lb (78 kg)   SpO2 96%   BMI 32.50 kg/m      Wt Readings from Last 3 Encounters:  07/02/24 168 lb 9.6 oz (76.5 kg)  07/01/24 165 lb (74.8 kg)  06/25/24 166 lb (75.3 kg)     GEN: Well nourished, well developed, in no acute distress NECK: No JVD; No carotid  bruits CARDIAC: RRR, no murmurs, rubs, gallops RESPIRATORY: Diffuse rhonchi and end expiratory wheezes throughout, no rales ABDOMEN: Soft, non-tender, with normal BS, distended abdomen EXTREMITIES:  Warm and well perfused, very trace bilateral edema; No deformity, 2+ radial pulses PSYCH: Normal mood and affect   Assessment & Plan Heart failure with preserved ejection fraction (HFpEF), unspecified HF chronicity (HCC) - Patient recently diagnosed with COPD exacerbation and also noted to be volume up.  Most recent echocardiogram demonstrates preserved systolic function. - She clinically does not appear volume up; however, her abdomen is distended and her weight is up.  This coupled with her symptoms suggest that she is currently volume overloaded. - Ultimately I think it will be hard to tease out whether her symptoms are related to her lung disease versus her heart; however, I think that increasing her diuretics makes sense given her physical exam. - If I am unsuccessful in improving her symptoms with diuresis she may ultimately need a right heart catheterization to further characterize her filling pressures. - For now we will switch her from Lasix  to torsemide and also start Jardiance to reduce heart failure hospitalization/mortality. Stop Lasix  Start torsemide 40 mg daily Start Jardiance 10 mg daily Check NT proBNP Follow-up in 1 month CAD S/P percutaneous coronary angioplasty - The patient has pretty extensive CAD including a CTO of her RCA and prior PCI. - Most recent LHC was in 2018 which showed extensive CAD, but was stable. - Ultimately her medical management has to be aggressive for secondary prevention. - She is currently on aspirin  and ticagrelor  indefinitely; however, the data for indefinite DAPT is not super strong.  Furthermore, ticagrelor  can be associated with dyspnea.  I think for now it is reasonable to continue DAPT given that she does not have any Creacy high bleeding risk.   But if her symptoms of shortness of breath do not improve with diuresis and COPD management, switching to Plavix may be reasonable. Continue aspirin  81 mg daily Continue ticagrelor  60 mg twice daily for now Essential hypertension - BP is at goal.  No changes. Mixed hyperlipidemia - Recent LDL at goal.  No changes.     Cardiac Rehabilitation Eligibility Assessment          This note was written with the assistance of a dictation microphone or AI dictation software. Please excuse any typos or grammatical errors.   Signed, Georganna Archer, MD 07/13/2024 1:11 PM    [facility]  "

## 2024-07-14 ENCOUNTER — Other Ambulatory Visit: Payer: Self-pay

## 2024-07-14 ENCOUNTER — Ambulatory Visit: Payer: Self-pay | Admitting: Student in an Organized Health Care Education/Training Program

## 2024-07-14 NOTE — Progress Notes (Signed)
 Specialty Pharmacy Refill Coordination Note  [name] [name] is a 68 y.o. female contacted today regarding refills of specialty medication(s) Dupilumab  (Dupixent )   Patient requested Delivery   Delivery date: 07/20/24   Verified address: [address] [place] [address]   Medication will be filled on: 07/19/24

## 2024-07-16 ENCOUNTER — Emergency Department (HOSPITAL_BASED_OUTPATIENT_CLINIC_OR_DEPARTMENT_OTHER): Admitting: Radiology

## 2024-07-16 ENCOUNTER — Emergency Department (HOSPITAL_BASED_OUTPATIENT_CLINIC_OR_DEPARTMENT_OTHER)
Admission: EM | Admit: 2024-07-16 | Discharge: 2024-07-16 | Disposition: A | Discharge status: Home / Self Care | Source: Home / Self Care | Attending: Emergency Medicine | Admitting: Emergency Medicine

## 2024-07-16 ENCOUNTER — Other Ambulatory Visit (HOSPITAL_COMMUNITY): Payer: Self-pay | Admitting: Emergency Medicine

## 2024-07-16 DIAGNOSIS — I110 Hypertensive heart disease with heart failure: Secondary | ICD-10-CM | POA: Insufficient documentation

## 2024-07-16 DIAGNOSIS — Z79890 Hormone replacement therapy (postmenopausal): Secondary | ICD-10-CM | POA: Insufficient documentation

## 2024-07-16 DIAGNOSIS — E1165 Type 2 diabetes mellitus with hyperglycemia: Secondary | ICD-10-CM | POA: Insufficient documentation

## 2024-07-16 DIAGNOSIS — E039 Hypothyroidism, unspecified: Secondary | ICD-10-CM | POA: Insufficient documentation

## 2024-07-16 DIAGNOSIS — Z7984 Long term (current) use of oral hypoglycemic drugs: Secondary | ICD-10-CM | POA: Insufficient documentation

## 2024-07-16 DIAGNOSIS — Z91040 Latex allergy status: Secondary | ICD-10-CM | POA: Insufficient documentation

## 2024-07-16 DIAGNOSIS — Z7982 Long term (current) use of aspirin: Secondary | ICD-10-CM | POA: Insufficient documentation

## 2024-07-16 DIAGNOSIS — J069 Acute upper respiratory infection, unspecified: Secondary | ICD-10-CM | POA: Insufficient documentation

## 2024-07-16 DIAGNOSIS — I509 Heart failure, unspecified: Secondary | ICD-10-CM | POA: Insufficient documentation

## 2024-07-16 DIAGNOSIS — Z79899 Other long term (current) drug therapy: Secondary | ICD-10-CM | POA: Insufficient documentation

## 2024-07-16 DIAGNOSIS — J4521 Mild intermittent asthma with (acute) exacerbation: Secondary | ICD-10-CM | POA: Insufficient documentation

## 2024-07-16 DIAGNOSIS — D72829 Elevated white blood cell count, unspecified: Secondary | ICD-10-CM | POA: Insufficient documentation

## 2024-07-16 LAB — COMPREHENSIVE METABOLIC PANEL WITH GFR
ALT: 32 U/L (ref 0–44)
AST: 23 U/L (ref 15–41)
Albumin: 4.1 g/dL (ref 3.5–5.0)
Alkaline Phosphatase: 96 U/L (ref 38–126)
Anion gap: 10 (ref 5–15)
BUN: 13 mg/dL (ref 8–23)
CO2: 31 mmol/L (ref 22–32)
Calcium: 9.5 mg/dL (ref 8.9–10.3)
Chloride: 99 mmol/L (ref 98–111)
Creatinine, Ser: 0.83 mg/dL (ref 0.44–1.00)
GFR, Estimated: >60 mL/min
Glucose, Bld: 151 mg/dL — ABNORMAL HIGH (ref 70–99)
Potassium: 3.9 mmol/L (ref 3.5–5.1)
Sodium: 140 mmol/L (ref 135–145)
Total Bilirubin: 0.3 mg/dL (ref 0.0–1.2)
Total Protein: 6.9 g/dL (ref 6.5–8.1)

## 2024-07-16 LAB — CBC WITH DIFFERENTIAL/PLATELET
Abs Immature Granulocytes: 0.09 K/uL — ABNORMAL HIGH (ref 0.00–0.07)
Basophils Absolute: 0 K/uL (ref 0.0–0.1)
Basophils Relative: 0 %
Eosinophils Absolute: 0.2 K/uL (ref 0.0–0.5)
Eosinophils Relative: 2 %
HCT: 36.7 % (ref 36.0–46.0)
Hemoglobin: 11.3 g/dL — ABNORMAL LOW (ref 12.0–15.0)
Immature Granulocytes: 1 %
Lymphocytes Relative: 14 %
Lymphs Abs: 1.9 K/uL (ref 0.7–4.0)
MCH: 28.5 pg (ref 26.0–34.0)
MCHC: 30.8 g/dL (ref 30.0–36.0)
MCV: 92.7 fL (ref 80.0–100.0)
Monocytes Absolute: 1.1 K/uL — ABNORMAL HIGH (ref 0.1–1.0)
Monocytes Relative: 8 %
Neutro Abs: 10.3 K/uL — ABNORMAL HIGH (ref 1.7–7.7)
Neutrophils Relative %: 75 %
Platelets: 376 K/uL (ref 150–400)
RBC: 3.96 MIL/uL (ref 3.87–5.11)
RDW: 15.9 % — ABNORMAL HIGH (ref 11.5–15.5)
WBC: 13.6 K/uL — ABNORMAL HIGH (ref 4.0–10.5)
nRBC: 0 % (ref 0.0–0.2)

## 2024-07-16 LAB — GROUP A STREP BY PCR: Group A Strep by PCR: NOT DETECTED

## 2024-07-16 LAB — PRO BRAIN NATRIURETIC PEPTIDE: Pro Brain Natriuretic Peptide: 67.6 pg/mL

## 2024-07-16 LAB — RESP PANEL BY RT-PCR (RSV, FLU A&B, COVID)  RVPGX2
Influenza A by PCR: NEGATIVE
Influenza B by PCR: NEGATIVE
Resp Syncytial Virus by PCR: NEGATIVE
SARS Coronavirus 2 by RT PCR: NEGATIVE

## 2024-07-16 MED ORDER — ALBUTEROL SULFATE HFA 108 (90 BASE) MCG/ACT IN AERS
2.0000 | INHALATION_SPRAY | Freq: Once | RESPIRATORY_TRACT | Status: AC
  Administered 2024-07-16: 2 via RESPIRATORY_TRACT

## 2024-07-16 MED ORDER — LACTATED RINGERS IV BOLUS
1000.0000 mL | Freq: Once | INTRAVENOUS | Status: AC
  Administered 2024-07-16: 1000 mL via INTRAVENOUS

## 2024-07-16 MED ORDER — IPRATROPIUM-ALBUTEROL 0.5-2.5 (3) MG/3ML IN SOLN
3.0000 mL | Freq: Once | RESPIRATORY_TRACT | Status: AC
  Administered 2024-07-16: 3 mL via RESPIRATORY_TRACT
  Filled 2024-07-16: qty 3

## 2024-07-16 MED ORDER — ALBUTEROL SULFATE HFA 108 (90 BASE) MCG/ACT IN AERS
1.0000 | INHALATION_SPRAY | Freq: Four times a day (QID) | RESPIRATORY_TRACT | Status: DC | PRN
  Filled 2024-07-16: qty 6.7

## 2024-07-16 MED ORDER — AEROCHAMBER PLUS FLO-VU MEDIUM MISC
1.0000 | Freq: Once | Status: AC
  Administered 2024-07-16: 1

## 2024-07-16 MED ORDER — METHYLPREDNISOLONE SODIUM SUCC 125 MG IJ SOLR
125.0000 mg | Freq: Once | INTRAMUSCULAR | Status: AC
  Administered 2024-07-16: 125 mg via INTRAVENOUS
  Filled 2024-07-16: qty 2

## 2024-07-16 MED ORDER — PREDNISONE 10 MG PO TABS
ORAL_TABLET | ORAL | 0 refills | Status: DC
  Filled 2024-07-16 – 2024-07-17 (×2): qty 15, 6d supply, fill #0

## 2024-07-16 NOTE — ED Provider Notes (Signed)
 " [facility] Provider Note   CSN: [id number] Arrival date & time: 07/16/24  1654     Patient presents with: No chief complaint on file.   [name] [name] is a 69 y.o. female with history of hypothyroidism, CHF, COPD on 4 L nasal cannula at home, asthma, hypertension, GERD, type 2 diabetes, presents with concern for a productive cough for the past month.  She reports worsening of her symptoms within the past couple of days.  Reports increased shortness of breath and wheezing.  She reports some chills at home, but no fevers.  Also reports a sore throat.   HPI     Prior to Admission medications  Medication Sig Start Date End Date Taking? Authorizing Provider  predniSONE  (DELTASONE ) 10 MG tablet Take 4 tablets (40 mg total) by mouth daily with breakfast for 1 day, THEN 3 tablets (30 mg total) daily with breakfast for 2 days, THEN 2 tablets (20 mg total) daily with breakfast for 2 days, THEN 1 tablet (10 mg total) daily with breakfast for 1 day. 07/16/24 07/22/24 Yes Veta Palma, PA-C  acetaminophen  (TYLENOL ) 500 MG tablet Take 1 tablet (500 mg total) by mouth every 6 (six) hours as needed for mild pain (pain score 1-3), fever or headache. 06/13/24   Arlice Reichert, MD  albuterol  (VENTOLIN  HFA) 108 (90 Base) MCG/ACT inhaler INHALE 2 PUFFS into THE lungs EVERY 6 HOURS AS NEEDED wheezing AND SHORTNESS OF BREATH 10/15/23   Vicci Barnie [name], MD  alendronate  (FOSAMAX ) 70 MG tablet Take 1 tablet (70 mg total) by mouth every 7 (seven) days. Take with a full glass of water on an empty stomach. 04/06/24   Vicci Barnie [name], MD  aspirin  EC 81 MG tablet Take 81 mg by mouth in the morning.    [provider]  atorvastatin  (LIPITOR ) 80 MG tablet Take 1 tablet (80 mg total) by mouth daily. 09/22/23   Daneen Damien BROCKS, NP  azithromycin  (ZITHROMAX ) 250 MG tablet Take 1 tablet (250 mg total) by mouth daily. Patient not taking: Reported on 07/16/2024 06/24/24    Kara Dorn [name], MD  benzonatate  (TESSALON ) 100 MG capsule Take 1 capsule (100 mg total) by mouth 3 (three) times daily as needed for cough. Patient not taking: Reported on 07/16/2024 06/22/24   Singh, Prashant K, MD  busPIRone  (BUSPAR ) 15 MG tablet Take 2 tablets (30 mg total) by mouth in the morning AND 1 tablet (15 mg total) daily at 12 noon AND 1 tablet (15 mg total) every evening. Patient taking differently: Take 60mg  by mouth at bedtime 05/18/24   Nwoko, Uchenna E, PA  clonazePAM  (KLONOPIN ) 0.5 MG tablet Take 1 tablet (0.5 mg total) by mouth 2 (two) times daily as needed. Patient taking differently: Take 0.5 mg by mouth 2 (two) times daily. 06/03/24   Nwoko, Uchenna E, PA  doxepin  (SINEQUAN ) 75 MG capsule Take 1 capsule (75 mg total) by mouth at bedtime. 05/18/24   Nwoko, Uchenna E, PA  Dupilumab  (DUPIXENT ) 300 MG/2ML SOAJ Inject 300 mg into the skin every 14 (fourteen) days. 05/26/24   Kara Dorn [name], MD  empagliflozin  (JARDIANCE ) 10 MG TABS tablet Take 1 tablet (10 mg total) by mouth daily before breakfast. 07/13/24   Floretta Mallard, MD  escitalopram  (LEXAPRO ) 20 MG tablet Take 1 tablet (20 mg total) by mouth daily. 05/18/24   Nwoko, Uchenna E, PA  ezetimibe  (ZETIA ) 10 MG tablet Take 1 tablet (10 mg total) by mouth daily. 06/21/24  Meng, Hao, [place]  Fluticasone -Umeclidin-Vilant (TRELEGY ELLIPTA ) 200-62.5-25 MCG/ACT AEPB Inhale 1 puff into the lungs daily. 07/02/24   Kara Dorn NOVAK, MD  gabapentin  (NEURONTIN ) 600 MG tablet Take 1 tablet (600 mg total) by mouth 2 (two) times daily. 12/04/23   Vicci Barnie NOVAK, MD  hydrOXYzine  (ATARAX ) 25 MG tablet Take 1 tablet (25 mg total) by mouth at bedtime as needed. 05/18/24   Nwoko, Uchenna E, PA  ipratropium-albuterol  (DUONEB) 0.5-2.5 (3) MG/3ML SOLN Take 3 mLs by nebulization every 8 (eight) hours. 01/15/24   Kara Dorn NOVAK, MD  levothyroxine  (SYNTHROID ) 112 MCG tablet Take 1 tablet (112 mcg total) by mouth daily. 01/22/24   Vicci Barnie NOVAK, MD   metFORMIN  (GLUCOPHAGE ) 500 MG tablet Take 1 tablet (500 mg total) by mouth 2 (two) times daily with a meal. Patient taking differently: Take 500 mg by mouth daily with breakfast. 03/12/24   Vicci Barnie NOVAK, MD  mirtazapine  (REMERON ) 45 MG tablet Take 1 tablet (45 mg total) by mouth at bedtime. 05/18/24   Nwoko, Uchenna E, PA  montelukast  (SINGULAIR ) 10 MG tablet Take 1 tablet (10 mg total) by mouth at bedtime. 11/11/23   Vicci Barnie NOVAK, MD  nebivolol  (BYSTOLIC ) 10 MG tablet Take 1 tablet (10 mg total) by mouth daily. 09/22/23   Daneen Damien BROCKS, NP  nicotine  (NICODERM CQ  - DOSED IN MG/24 HOURS) 21 mg/24hr patch Place 1 patch (21 mg total) onto the skin daily. Patient not taking: Reported on 07/16/2024 06/22/24   Singh, Prashant K, MD  nitroGLYCERIN  (NITROSTAT ) 0.4 MG SL tablet Place 1 tablet (0.4 mg total) under the tongue every 5 (five) minutes as needed for chest pain. 04/15/24 07/16/24  Daneen Damien BROCKS, NP  OHTUVAYRE  3 MG/2.5ML SUSP Take 1 vial by nebulization in the morning and at bedtime. 05/19/24   [provider]  omeprazole  (PRILOSEC) 40 MG capsule Take 1 capsule (40 mg total) by mouth 2 (two) times daily before a meal. NEED APPOINTMENT FOR ADDITIONAL REFILLS 04/21/24   Legrand Victory LITTIE DOUGLAS, MD  OXYGEN  Inhale 4 L/min into the lungs continuous.    [provider]  ticagrelor  (BRILINTA ) 60 MG TABS tablet Take 1 tablet (60 mg total) by mouth 2 (two) times daily. 12/11/23   Burnard Debby LABOR, MD  torsemide  (DEMADEX ) 20 MG tablet Take 2 tablets (40 mg total) by mouth daily. 07/13/24   Floretta Mallard, MD  varenicline  (CHANTIX ) 1 MG tablet Take 1 tablet (1 mg total) by mouth 2 (two) times daily. 04/21/24   Kara Dorn NOVAK, MD    Allergies: Avocado, Latex, and Codeine     Review of Systems  Respiratory:  Positive for cough and shortness of breath.     Updated Vital Signs BP (!) 152/110   Pulse 75   Temp 98.8 F (37.1 C) (Oral)   Resp 18   SpO2 99%   Physical Exam Vitals and  nursing note reviewed.  Constitutional:      General: She is not in acute distress.    Appearance: She is well-developed.  HENT:     Head: Normocephalic and atraumatic.     Mouth/Throat:     Pharynx: Posterior oropharyngeal erythema present. No oropharyngeal exudate.     Comments: Posterior oropharynx erythematous without edema.  No peritonsillar abscess.  No tonsillar exudate.  Mucous membranes dry. Eyes:     Conjunctiva/sclera: Conjunctivae normal.  Cardiovascular:     Rate and Rhythm: Normal rate and regular rhythm.     Heart sounds: No  murmur heard. Pulmonary:     Effort: Pulmonary effort is normal. No respiratory distress.     Comments: Diffuse expiratory wheezing bilaterally.  Also with rhonchi noted bilaterally.  She is able to talk in full sentences on baseline 4 L nasal cannula maintaining 96% Abdominal:     Palpations: Abdomen is soft.     Tenderness: There is no abdominal tenderness.  Musculoskeletal:        General: No swelling.     Cervical back: Neck supple.  Skin:    General: Skin is warm and dry.     Capillary Refill: Capillary refill takes less than 2 seconds.  Neurological:     Mental Status: She is alert.  Psychiatric:        Mood and Affect: Mood normal.     (all labs ordered are listed, but only abnormal results are displayed) Labs Reviewed  CBC WITH DIFFERENTIAL/PLATELET - Abnormal; Notable for the following components:      Result Value   WBC 13.6 (*)    Hemoglobin 11.3 (*)    RDW 15.9 (*)    Neutro Abs 10.3 (*)    Monocytes Absolute 1.1 (*)    Abs Immature Granulocytes 0.09 (*)    All other components within normal limits  COMPREHENSIVE METABOLIC PANEL WITH GFR - Abnormal; Notable for the following components:   Glucose, Bld 151 (*)    All other components within normal limits  RESP PANEL BY RT-PCR (RSV, FLU A&B, COVID)  [id number]  GROUP A STREP BY PCR  PRO BRAIN NATRIURETIC PEPTIDE    EKG: None  Radiology: DG Chest 2 View Result Date:  07/16/2024 CLINICAL DATA:  Cough, shortness of breath EXAM: CHEST - 2 VIEW COMPARISON:  July 02, 2024 FINDINGS: The heart size and mediastinal contours are within normal limits. Stable minimal left basilar scarring is noted. No acute pulmonary disease is noted. Old left clavicular fracture. Old right rib fractures. IMPRESSION: No active cardiopulmonary disease. Electronically Signed   By: Lynwood Landy Raddle M.D.   On: 07/16/2024 18:24     Procedures   Medications Ordered in the ED  albuterol  (VENTOLIN  HFA) 108 (90 Base) MCG/ACT inhaler 1-2 puff (has no administration in time range)  ipratropium-albuterol  (DUONEB) 0.5-2.5 (3) MG/3ML nebulizer solution 3 mL (3 mLs Nebulization Given 07/16/24 1759)  ipratropium-albuterol  (DUONEB) 0.5-2.5 (3) MG/3ML nebulizer solution 3 mL (3 mLs Nebulization Given 07/16/24 1758)  methylPREDNISolone  sodium succinate (SOLU-MEDROL ) 125 mg/2 mL injection 125 mg (125 mg Intravenous Given 07/16/24 1751)  lactated ringers  bolus 1,000 mL (0 mLs Intravenous Stopped 07/16/24 2143)  albuterol  (VENTOLIN  HFA) 108 (90 Base) MCG/ACT inhaler 2 puff (2 puffs Inhalation Given 07/16/24 2129)  AeroChamber Plus Flo-Vu Medium MISC 1 each (1 each Other Given 07/16/24 2129)                                    Medical Decision Making Amount and/or Complexity of Data Reviewed Labs: ordered. Radiology: ordered.  Risk Prescription drug management.      Differential diagnosis includes but is not limited to COVID, flu, RSV, viral URI, strep pharyngitis, viral pharyngitis, allergic rhinitis, pneumonia, bronchitis, asthma exacerbation, COPD exacerbation   ED Course:  Upon initial evaluation, patient is well-appearing, no acute distress.  She is talking in full sentences on her baseline 4 L nasal cannula.  Lungs with diffuse expiratory wheezing bilaterally and some rhonchi noted.  Patient given DuoNeb treatments  upon arrival. Appears dry on exam ,will give fluid bolus at slow 223ml/hr  rate  Labs Ordered: I Ordered, and personally interpreted labs.  The pertinent results include:   COVID, flu, RSV negative CBC were leukocytosis of 13.6.  Hemoglobin 11.3, stable CMP with elevated glucose at 151, otherwise within normal limits Strep negative proBNP within normal limits  Imaging Studies ordered: I ordered imaging studies including chest x-ray I independently visualized the imaging with scope of interpretation limited to determining acute life threatening conditions related to emergency care. Imaging showed no acute abnormality I agree with the radiologist interpretation    Medications Given: DuoNeb x 2 Solu-Medrol  LR bolus  Upon re-evaluation, patient remains well-appearing with stable vitals.  She reports improvement in her cough with the DuoNeb treatments given.  Upon reauscultation, improved airflow in the lungs bilaterally, but patient still with some wheezing bilaterally.  Her COVID, flu, RSV testing is negative.  Chest x-ray without consolidations, no fevers, doubt pneumonia.  Given ongoing nature of cough for the past couple weeks, suspect viral URI versus bronchitis.  Given lungs with wheezing, will treat for possible asthma exacerbation from this with course of prednisone  at home. Patient was ambulated here in the emergency room with her baseline 4 L nasal cannula and did not have any desaturations.  She felt comfortable ambulating to the bathroom and back. No lower extremity edema on exam, proBNP within normal limits, no pulmonary edema on chest x-ray, doubt CHF  Patient stable and appropriate for discharge home at this time  Impression: Viral URI with asthma exacerbation  Disposition:  Discharged home with instructions to use over-the-counter medications as needed for symptom control.  She is provided an albuterol  inhaler to use at home as needed for feelings of shortness of breath and wheezing.  Take course of prednisone  as prescribed.  Follow-up with PCP  if symptoms not improving within the next 5 days. Return precautions given and patient verbalized understanding.    This chart was dictated using voice recognition software, Dragon. Despite the best efforts of this provider to proofread and correct errors, errors may still occur which can change documentation meaning.      Final diagnoses:  Exacerbation of intermittent asthma, unspecified asthma severity  Viral URI with cough    ED Discharge Orders          Ordered    predniSONE  (DELTASONE ) 10 MG tablet  Q breakfast        07/16/24 2130               Veta Palma, PA-C 07/16/24 2155    Patsey Lot, MD 07/19/24 1503  "

## 2024-07-16 NOTE — ED Notes (Signed)
 Reviewed AVS/discharge instruction with patient. Time allotted for and all questions answered. Patient is agreeable for d/c and escorted to ed exit by staff.

## 2024-07-16 NOTE — ED Notes (Signed)
 Assisted pt to bedside commode. Pt reports upon returning to bed that she feels Sob and sats were 93%. Shawn, RN notified and at bedside. Pt's sats returned to 97% and work of breathing decreased

## 2024-07-16 NOTE — Progress Notes (Signed)
 Paramedicine Encounter    Patient ID: [name] [name], female    DOB: 10-01-1955, 69 y.o.   MRN: [id number]   Complaints Severe cough w/ body aches w/o fever  Assessment A&O x 4, skin W&D w/ good color.  Pt states she feels horrible and has been in bed pretty much all day. Pt with chronic, consistent productive cough.  She has audible wheezes and ronchi confirmed w/ auscultation.  Compliance with meds Has not taken today's morning meds at time of visit.  Pill box filled n/a (filled by daughter)  Refills needed none  Meds changes since last visit    Social changes NONE   BP (!) 140/68 (BP Location: Right Arm, Patient Position: Sitting, Cuff Size: Normal)   Pulse 70   Resp (!) 26   SpO2 98%  Weight yesterday- Last visit weight-165lb  Today's visit was not scheduled.  She called and asked if I could come by and assess her due to her increased productive cough and SOB.  Body aches w/o fever.   Pt states she has been I bed most of the day.  She hasn't taken her morning meds per normal.  She is audibly wheezing.  Had her take one of her own duo nebs w/o much change in lung sounds.  She has recently been treated for pneumonia and has finished meds for same.  Based on today's presentation, I suggested she seek further eval at the Hospital due to her respiratory history.  Spoke with pt's daughter by phone and she was on board with having her mom transported.  Contacted 911 for transport unit.  Pt agreed to be transported to [facility] due to severe over crowding at [facility] main campus and [facility].     ACTION: Home visit completed  [name] [phone] 07/18/2024  Patient Care Team: Vicci Barnie [name], MD as PCP - General (Internal Medicine) Floretta Mallard, MD as PCP - Cardiology (Cardiology) Shaaron Lamar HERO, MD as Consulting Physician (Gastroenterology) Onesimo Emaline Brink, MD as Consulting Physician (Hematology) Collene Reginia BRAVO, [place] as  Physician Assistant (Physician Assistant) Carolee Heron [name], RN as Case Manager  Patient Active Problem List   Diagnosis Date Noted   (HFpEF) heart failure with preserved ejection fraction (HCC) 06/18/2024   Non-insulin  dependent type 2 diabetes mellitus (HCC) 06/18/2024   History of depression 06/18/2024   Diabetic neuropathy (HCC) 06/18/2024   Hypokalemia 01/08/2024   Acute on chronic heart failure with preserved ejection fraction (HFpEF, >= 50%) (HCC) 12/05/2023   At risk for polypharmacy 12/04/2023   Long-term current use of antidepressant 11/17/2023   Long-term current use of benzodiazepine 11/17/2023   Sigmoid diverticulitis 11/12/2023   Acute kidney injury superimposed on chronic kidney disease 11/12/2023   CKD (chronic kidney disease) stage 2, GFR 60-89 ml/min 11/12/2023   Sepsis due to pneumonia (HCC) 11/12/2023   Acute on chronic hypoxic respiratory failure (HCC) 10/05/2023   Pneumothorax, right 09/16/2023   COPD with acute exacerbation (HCC) 09/01/2023   Chronic hypoxic respiratory failure (HCC) 09/01/2023   Continuous dependence on cigarette smoking 09/01/2023   History of CAD (coronary artery disease) 09/01/2023   CAP (community acquired pneumonia) 09/01/2023   Full code status 06/14/2023   Acute hypoxic respiratory failure (HCC) 06/13/2023   Acute exacerbation of chronic obstructive pulmonary disease (COPD) (HCC) 06/12/2023   Nicotine  dependence 05/03/2023   Substance abuse (HCC) 09/10/2022   Iron  deficiency anemia 11/02/2021   Major depressive disorder, recurrent, in full remission with anxious  distress 05/31/2021   Chronic neck pain 02/26/2021   GAD (generalized anxiety disorder) 01/08/2021   Esophageal dysphagia 04/28/2019   Bipolar disorder (HCC) 09/17/2018   Insomnia 10/28/2017   Prediabetes 05/28/2017   QT prolongation 03/25/2017   Psoriasis 06/22/2015   COPD (chronic obstructive pulmonary disease) (HCC)GOLD E 05/18/2015   Anxiety and depression 07/10/2013    Essential hypertension 06/07/2013   Hyperlipidemia 06/07/2013   Asthma, chronic 06/07/2013   Emphysema lung (HCC) 06/07/2013   Chronic back pain 06/07/2013   CAD S/P percutaneous coronary angioplasty 06/07/2013   GERD (gastroesophageal reflux disease) 06/07/2013   Breast lump on left side at 3 o'clock position 10/13/2012   S/P abdominal hysterectomy and right salpingo-oophorectomy 08/29/2011   COPD exacerbation (HCC) 09/15/2009   TOBACCO ABUSE 07/17/2009   Chronic rhinitis 07/17/2009   Lung nodule < 6cm on CT 07/17/2009   Hypothyroidism 12/22/2008   Chronic depression 12/22/2008   Current Medications[1] Allergies[2]   Social History   Socioeconomic History   Marital status: Divorced    Spouse name: Not on file   Number of children: 1   Years of education: Not on file   Highest education level: 10th grade  Occupational History   Not on file  Tobacco Use   Smoking status: Every Day    Current packs/day: 0.50    Average packs/day: 0.5 packs/day for 50.0 years (25.0 ttl pk-yrs)    Types: Cigarettes   Smokeless tobacco: Never  Vaping Use   Vaping status: Some Days   Substances: Nicotine   Substance and Sexual Activity   Alcohol use: Yes    Comment: once a week   Drug use: Yes    Types: Marijuana    Comment: history of cocaine use, been about a year, per patient (12/18/21)   Sexual activity: Not Currently    Birth control/protection: Surgical  Other Topics Concern   Not on file  Social History Narrative   Not on file   Social Drivers of Health   Tobacco Use: High Risk (07/13/2024)   Patient History    Smoking Tobacco Use: Every Day    Smokeless Tobacco Use: Never    Passive Exposure: Not on file  Financial Resource Strain: Medium Risk (06/18/2024)   Overall Financial Resource Strain (CARDIA)    Difficulty of Paying Living Expenses: Somewhat hard  Food Insecurity: No Food Insecurity (06/23/2024)   Epic    Worried About Programme Researcher, Broadcasting/film/video in the Last Year:  Never true    [employer] of Food in the Last Year: Never true  Recent Concern: Food Insecurity - Food Insecurity Present (06/18/2024)   Epic    Worried About Programme Researcher, Broadcasting/film/video in the Last Year: Sometimes true    [employer] of Food in the Last Year: Sometimes true  Transportation Needs: No Transportation Needs (06/23/2024)   Epic    Lack of Transportation (Medical): No    Lack of Transportation (Non-Medical): No  Physical Activity: Inactive (06/18/2024)   Exercise Vital Sign    Days of Exercise per Week: 0 days    Minutes of Exercise per Session: Not on file  Stress: No Stress Concern Present (06/18/2024)   [employer] of Occupational Health - Occupational Stress Questionnaire    Feeling of Stress: Only a little  Social Connections: Moderately Isolated (06/18/2024)   Social Connection and Isolation Panel    Frequency of Communication with Friends and Family: More than three times a week    Frequency of Social Gatherings with Friends  and Family: Once a week    Attends Religious Services: Never    Active Member of Clubs or Organizations: No    Attends Banker Meetings: 1 to 4 times per year    Marital Status: Divorced  Intimate Partner Violence: Not At Risk (06/23/2024)   Epic    Fear of Current or Ex-Partner: No    Emotionally Abused: No    Physically Abused: No    Sexually Abused: No  Depression (PHQ2-9): Low Risk (07/02/2024)   Depression (PHQ2-9)    PHQ-2 Score: 0  Recent Concern: Depression (PHQ2-9) - Medium Risk (05/18/2024)   Depression (PHQ2-9)    PHQ-2 Score: 9  Alcohol Screen: Low Risk (06/18/2024)   Alcohol Screen    Last Alcohol Screening Score (AUDIT): 1  Housing: Low Risk (06/23/2024)   Epic    Unable to Pay for Housing in the Last Year: No    Number of Times Moved in the Last Year: 0    Homeless in the Last Year: No  Utilities: Not At Risk (06/23/2024)   Epic    Threatened with loss of utilities: No  Health Literacy: Inadequate Health Literacy  (08/07/2023)   B1300 Health Literacy    Frequency of need for help with medical instructions: Always    Physical Exam      Future Appointments  Date Time Provider Department Center  07/19/2024  3:00 PM Lamount Ethan CROME, DPM [facility]  08/11/2024 11:00 AM [facility] ROOM [facility] [address]  08/11/2024  2:45 PM Kara Dorn NOVAK, MD [facility] [address]  08/12/2024  2:40 PM Floretta Mallard, MD [facility]  08/19/2024  4:10 PM Vicci Barnie NOVAK, MD [facility] [address]  05/06/2025  1:30 PM [facility] None  05/06/2025  2:00 PM Onesimo Emaline Brink, MD [facility] None           [1]  Current Outpatient Medications:    acetaminophen  (TYLENOL ) 500 MG tablet, Take 1 tablet (500 mg total) by mouth every 6 (six) hours as needed for mild pain (pain score 1-3), fever or headache., Disp: , Rfl:    albuterol  (VENTOLIN  HFA) 108 (90 Base) MCG/ACT inhaler, INHALE 2 PUFFS into THE lungs EVERY 6 HOURS AS NEEDED wheezing AND SHORTNESS OF BREATH, Disp: 54 g, Rfl: 1   alendronate  (FOSAMAX ) 70 MG tablet, Take 1 tablet (70 mg total) by mouth every 7 (seven) days. Take with a full glass of water on an empty stomach., Disp: 12 tablet, Rfl: 0   aspirin  EC 81 MG tablet, Take 81 mg by mouth in the morning., Disp: , Rfl:    atorvastatin  (LIPITOR ) 80 MG tablet, Take 1 tablet (80 mg total) by mouth daily., Disp: 90 tablet, Rfl: 3   busPIRone  (BUSPAR ) 15 MG tablet, Take 2 tablets (30 mg total) by mouth in the morning AND 1 tablet (15 mg total) daily at 12 noon AND 1 tablet (15 mg total) every evening. (Patient taking differently: Take 60mg  by mouth at bedtime), Disp: 360 tablet, Rfl: 0   clonazePAM  (KLONOPIN ) 0.5 MG tablet, Take 1 tablet (0.5 mg total) by mouth 2 (two) times daily as needed. (Patient taking differently: Take 0.5 mg by mouth 2 (two) times daily.), Disp: 60 tablet, Rfl: 0   doxepin  (SINEQUAN ) 75 MG capsule, Take 1 capsule (75 mg total) by mouth at bedtime., Disp: 90  capsule, Rfl: 0   Dupilumab  (DUPIXENT ) 300 MG/2ML SOAJ, Inject 300 mg into the skin every 14 (fourteen) days., Disp: 4 mL,  Rfl: 3   empagliflozin  (JARDIANCE ) 10 MG TABS tablet, Take 1 tablet (10 mg total) by mouth daily before breakfast., Disp: 90 tablet, Rfl: 3   escitalopram  (LEXAPRO ) 20 MG tablet, Take 1 tablet (20 mg total) by mouth daily., Disp: 90 tablet, Rfl: 0   ezetimibe  (ZETIA ) 10 MG tablet, Take 1 tablet (10 mg total) by mouth daily., Disp: 90 tablet, Rfl: 3   Fluticasone -Umeclidin-Vilant (TRELEGY ELLIPTA ) 200-62.5-25 MCG/ACT AEPB, Inhale 1 puff into the lungs daily., Disp: 60 each, Rfl: 11   gabapentin  (NEURONTIN ) 600 MG tablet, Take 1 tablet (600 mg total) by mouth 2 (two) times daily., Disp: 180 tablet, Rfl: 2   hydrOXYzine  (ATARAX ) 25 MG tablet, Take 1 tablet (25 mg total) by mouth at bedtime as needed., Disp: 30 tablet, Rfl: 2   ipratropium-albuterol  (DUONEB) 0.5-2.5 (3) MG/3ML SOLN, Take 3 mLs by nebulization every 8 (eight) hours., Disp: 90 mL, Rfl: 1   levothyroxine  (SYNTHROID ) 112 MCG tablet, Take 1 tablet (112 mcg total) by mouth daily., Disp: 90 tablet, Rfl: 2   metFORMIN  (GLUCOPHAGE ) 500 MG tablet, Take 1 tablet (500 mg total) by mouth 2 (two) times daily with a meal. (Patient taking differently: Take 500 mg by mouth daily with breakfast.), Disp: 180 tablet, Rfl: 1   mirtazapine  (REMERON ) 45 MG tablet, Take 1 tablet (45 mg total) by mouth at bedtime., Disp: 90 tablet, Rfl: 0   montelukast  (SINGULAIR ) 10 MG tablet, Take 1 tablet (10 mg total) by mouth at bedtime., Disp: 90 tablet, Rfl: 2   nebivolol  (BYSTOLIC ) 10 MG tablet, Take 1 tablet (10 mg total) by mouth daily., Disp: 90 tablet, Rfl: 3   nitroGLYCERIN  (NITROSTAT ) 0.4 MG SL tablet, Place 1 tablet (0.4 mg total) under the tongue every 5 (five) minutes as needed for chest pain., Disp: 25 tablet, Rfl: 3   OHTUVAYRE  3 MG/2.5ML SUSP, Take 1 vial by nebulization in the morning and at bedtime., Disp: , Rfl:    omeprazole   (PRILOSEC) 40 MG capsule, Take 1 capsule (40 mg total) by mouth 2 (two) times daily before a meal. NEED APPOINTMENT FOR ADDITIONAL REFILLS, Disp: 60 capsule, Rfl: 0   OXYGEN , Inhale 4 L/min into the lungs continuous., Disp: , Rfl:    ticagrelor  (BRILINTA ) 60 MG TABS tablet, Take 1 tablet (60 mg total) by mouth 2 (two) times daily., Disp: 180 tablet, Rfl: 2   torsemide  (DEMADEX ) 20 MG tablet, Take 2 tablets (40 mg total) by mouth daily., Disp: 180 tablet, Rfl: 3   varenicline  (CHANTIX ) 1 MG tablet, Take 1 tablet (1 mg total) by mouth 2 (two) times daily., Disp: 60 tablet, Rfl: 3   azithromycin  (ZITHROMAX ) 250 MG tablet, Take 1 tablet (250 mg total) by mouth daily. (Patient not taking: Reported on 07/16/2024), Disp: 30 tablet, Rfl: 5   benzonatate  (TESSALON ) 100 MG capsule, Take 1 capsule (100 mg total) by mouth 3 (three) times daily as needed for cough. (Patient not taking: Reported on 07/16/2024), Disp: 20 capsule, Rfl: 0   nicotine  (NICODERM CQ  - DOSED IN MG/24 HOURS) 21 mg/24hr patch, Place 1 patch (21 mg total) onto the skin daily. (Patient not taking: Reported on 07/16/2024), Disp: 28 patch, Rfl: 0   predniSONE  (DELTASONE ) 10 MG tablet, Take 4 tablets (40 mg total) by mouth daily with breakfast for 1 day, THEN 3 tablets (30 mg total) daily with breakfast for 2 days, THEN 2 tablets (20 mg total) daily with breakfast for 2 days, THEN 1 tablet (10 mg total) daily with breakfast  for 1 day., Disp: 15 tablet, Rfl: 0 [2]  Allergies Allergen Reactions   Avocado Anaphylaxis   Latex Shortness Of Breath and Rash   Codeine  Nausea Only

## 2024-07-16 NOTE — Discharge Instructions (Addendum)
 You appear to have an upper respiratory infection (URI). An upper respiratory tract infection, or cold, is a viral infection of the air passages leading to the lungs. It should improve gradually after 5-7 days. You may have a lingering cough that lasts for 2- 4 weeks after the infection.  Your illness is contagious and can be spread to others. It cannot be cured by antibiotics or other medicines. Take basic precautions such as washing your hands often, covering your mouth when you cough or sneeze, and avoiding public places where you could spread your illness to others.   Your flu, covid, and RSV test were negative today.  Your chest x-ray did not show any pneumonia  You did have wheezing on your exam today.  You are being treated for a asthma/COPD exacerbation with a course of prednisone  at home.  Please take this daily as prescribed during tomorrow morning.  Please continue to use your albuterol  inhaler as needed for any shortness of breath or wheezing at home.  Home care instructions:  You can take Tylenol  and/or Ibuprofen  as directed on the packaging for fever reduction and pain relief.    For cough: honey 1/2 to 1 teaspoon (you can dilute the honey in water or another fluid).  You can also use guaifenesin  and dextromethorphan  for cough which are over-the-counter medications. You can use a humidifier for chest congestion and cough.  If you don't have a humidifier, you can sit in the bathroom with the hot shower running.      For sore throat: try warm salt water gargles, cepacol lozenges, throat spray, warm tea or water with lemon/honey, popsicles or ice, or OTC cold relief medicine for throat discomfort.    For congestion: Flonase  (Fluticasone ) 1-2 sprays in each nostril daily. This is an over the counter medication.    It is important to stay hydrated: drink plenty of fluids (water, gatorade/powerade/pedialyte, juices, or teas) to keep your throat moisturized and help further relieve  irritation/discomfort.   Follow-up instructions: Please follow-up with your primary care provider for further evaluation of your symptoms if you are not feeling better within the next 7 days.   Return instructions:  Please return to the Emergency Department if you experience worsening symptoms.  RETURN IMMEDIATELY IF you develop shortness of breath, confusion or altered mental status, a new rash, become dizzy, faint, or poorly responsive, or are unable to be cared for at home. Please return if you have persistent vomiting and cannot keep down fluids or develop a fever that is not controlled by tylenol  or motrin .   Please return if you have any other emergent concerns.

## 2024-07-16 NOTE — ED Notes (Signed)
 Walked half way down the hall on 3L O2. Some DOE noted with complaints of dizziness. Otherwise, Vitals remained stable without the need for increased O2. SpO2 remained 95-97 throughout the whole time. Standing break for approximately 40 seconds and walked back to room without event. VSS upon room re-entry. Patient back to bed.

## 2024-07-16 NOTE — ED Triage Notes (Signed)
 Pt bib [facility] from home with c/o flu like symptoms. Pt has wheezing, increased  shob, on 3L at baseline, placed on 4L o2 by EMS. Afebrile. Endorses chills 140/68 76 16 98 4L  127 CBG 97.2 T

## 2024-07-17 ENCOUNTER — Other Ambulatory Visit (HOSPITAL_COMMUNITY): Payer: Self-pay

## 2024-07-18 ENCOUNTER — Emergency Department (HOSPITAL_COMMUNITY)

## 2024-07-18 ENCOUNTER — Other Ambulatory Visit: Payer: Self-pay

## 2024-07-18 ENCOUNTER — Inpatient Hospital Stay (HOSPITAL_COMMUNITY)
Admission: EM | Admit: 2024-07-18 | Discharge: 2024-07-26 | DRG: 189 | Disposition: A | Discharge status: Home Health | Attending: Student | Admitting: Student

## 2024-07-18 ENCOUNTER — Encounter (HOSPITAL_COMMUNITY): Payer: Self-pay

## 2024-07-18 DIAGNOSIS — E785 Hyperlipidemia, unspecified: Secondary | ICD-10-CM | POA: Diagnosis present

## 2024-07-18 DIAGNOSIS — Z955 Presence of coronary angioplasty implant and graft: Secondary | ICD-10-CM

## 2024-07-18 DIAGNOSIS — B348 Other viral infections of unspecified site: Secondary | ICD-10-CM | POA: Diagnosis present

## 2024-07-18 DIAGNOSIS — F32A Depression, unspecified: Secondary | ICD-10-CM | POA: Diagnosis present

## 2024-07-18 DIAGNOSIS — N182 Chronic kidney disease, stage 2 (mild): Secondary | ICD-10-CM | POA: Diagnosis present

## 2024-07-18 DIAGNOSIS — Z7984 Long term (current) use of oral hypoglycemic drugs: Secondary | ICD-10-CM

## 2024-07-18 DIAGNOSIS — E873 Alkalosis: Secondary | ICD-10-CM | POA: Diagnosis not present

## 2024-07-18 DIAGNOSIS — I130 Hypertensive heart and chronic kidney disease with heart failure and stage 1 through stage 4 chronic kidney disease, or unspecified chronic kidney disease: Secondary | ICD-10-CM | POA: Diagnosis present

## 2024-07-18 DIAGNOSIS — F17210 Nicotine dependence, cigarettes, uncomplicated: Secondary | ICD-10-CM | POA: Diagnosis present

## 2024-07-18 DIAGNOSIS — I10 Essential (primary) hypertension: Secondary | ICD-10-CM | POA: Diagnosis present

## 2024-07-18 DIAGNOSIS — H548 Legal blindness, as defined in USA: Secondary | ICD-10-CM | POA: Diagnosis present

## 2024-07-18 DIAGNOSIS — E782 Mixed hyperlipidemia: Secondary | ICD-10-CM | POA: Diagnosis present

## 2024-07-18 DIAGNOSIS — E66812 Obesity, class 2: Secondary | ICD-10-CM | POA: Diagnosis present

## 2024-07-18 DIAGNOSIS — J441 Chronic obstructive pulmonary disease with (acute) exacerbation: Principal | ICD-10-CM | POA: Diagnosis present

## 2024-07-18 DIAGNOSIS — Z79899 Other long term (current) drug therapy: Secondary | ICD-10-CM

## 2024-07-18 DIAGNOSIS — E119 Type 2 diabetes mellitus without complications: Secondary | ICD-10-CM

## 2024-07-18 DIAGNOSIS — J9621 Acute and chronic respiratory failure with hypoxia: Principal | ICD-10-CM | POA: Diagnosis present

## 2024-07-18 DIAGNOSIS — F325 Major depressive disorder, single episode, in full remission: Secondary | ICD-10-CM | POA: Diagnosis present

## 2024-07-18 DIAGNOSIS — I5030 Unspecified diastolic (congestive) heart failure: Secondary | ICD-10-CM | POA: Diagnosis present

## 2024-07-18 DIAGNOSIS — E1122 Type 2 diabetes mellitus with diabetic chronic kidney disease: Secondary | ICD-10-CM | POA: Diagnosis present

## 2024-07-18 DIAGNOSIS — E1142 Type 2 diabetes mellitus with diabetic polyneuropathy: Secondary | ICD-10-CM | POA: Diagnosis present

## 2024-07-18 DIAGNOSIS — F3342 Major depressive disorder, recurrent, in full remission: Secondary | ICD-10-CM | POA: Diagnosis present

## 2024-07-18 DIAGNOSIS — Z6832 Body mass index (BMI) 32.0-32.9, adult: Secondary | ICD-10-CM

## 2024-07-18 DIAGNOSIS — Z9981 Dependence on supplemental oxygen: Secondary | ICD-10-CM

## 2024-07-18 DIAGNOSIS — E876 Hypokalemia: Secondary | ICD-10-CM | POA: Diagnosis not present

## 2024-07-18 DIAGNOSIS — J9611 Chronic respiratory failure with hypoxia: Secondary | ICD-10-CM | POA: Diagnosis present

## 2024-07-18 DIAGNOSIS — Z79890 Hormone replacement therapy (postmenopausal): Secondary | ICD-10-CM

## 2024-07-18 DIAGNOSIS — F17200 Nicotine dependence, unspecified, uncomplicated: Secondary | ICD-10-CM | POA: Diagnosis present

## 2024-07-18 DIAGNOSIS — J9622 Acute and chronic respiratory failure with hypercapnia: Secondary | ICD-10-CM | POA: Diagnosis present

## 2024-07-18 DIAGNOSIS — Z7189 Other specified counseling: Secondary | ICD-10-CM

## 2024-07-18 DIAGNOSIS — Z66 Do not resuscitate: Secondary | ICD-10-CM | POA: Diagnosis present

## 2024-07-18 DIAGNOSIS — B9789 Other viral agents as the cause of diseases classified elsewhere: Secondary | ICD-10-CM | POA: Diagnosis present

## 2024-07-18 DIAGNOSIS — E039 Hypothyroidism, unspecified: Secondary | ICD-10-CM | POA: Diagnosis present

## 2024-07-18 DIAGNOSIS — I2510 Atherosclerotic heart disease of native coronary artery without angina pectoris: Secondary | ICD-10-CM | POA: Diagnosis present

## 2024-07-18 DIAGNOSIS — F411 Generalized anxiety disorder: Secondary | ICD-10-CM | POA: Diagnosis present

## 2024-07-18 DIAGNOSIS — I5032 Chronic diastolic (congestive) heart failure: Secondary | ICD-10-CM | POA: Diagnosis present

## 2024-07-18 DIAGNOSIS — Z833 Family history of diabetes mellitus: Secondary | ICD-10-CM

## 2024-07-18 DIAGNOSIS — K219 Gastro-esophageal reflux disease without esophagitis: Secondary | ICD-10-CM | POA: Diagnosis present

## 2024-07-18 DIAGNOSIS — Z8249 Family history of ischemic heart disease and other diseases of the circulatory system: Secondary | ICD-10-CM

## 2024-07-18 LAB — I-STAT ARTERIAL BLOOD GAS, ED
Acid-Base Excess: 7 mmol/L — ABNORMAL HIGH (ref 0.0–2.0)
Bicarbonate: 34.2 mmol/L — ABNORMAL HIGH (ref 20.0–28.0)
Calcium, Ion: 1.26 mmol/L (ref 1.15–1.40)
HCT: 34 % — ABNORMAL LOW (ref 36.0–46.0)
Hemoglobin: 11.6 g/dL — ABNORMAL LOW (ref 12.0–15.0)
O2 Saturation: 97 %
Potassium: 3.4 mmol/L — ABNORMAL LOW (ref 3.5–5.1)
Sodium: 137 mmol/L (ref 135–145)
TCO2: 36 mmol/L — ABNORMAL HIGH (ref 22–32)
pCO2 arterial: 62 mmHg — ABNORMAL HIGH (ref 32–48)
pH, Arterial: 7.349 — ABNORMAL LOW (ref 7.35–7.45)
pO2, Arterial: 105 mmHg (ref 83–108)

## 2024-07-18 LAB — I-STAT VENOUS BLOOD GAS, ED
Acid-Base Excess: 9 mmol/L — ABNORMAL HIGH (ref 0.0–2.0)
Bicarbonate: 35.6 mmol/L — ABNORMAL HIGH (ref 20.0–28.0)
Calcium, Ion: 1.11 mmol/L — ABNORMAL LOW (ref 1.15–1.40)
HCT: 36 % (ref 36.0–46.0)
Hemoglobin: 12.2 g/dL (ref 12.0–15.0)
O2 Saturation: 56 %
Potassium: 3.4 mmol/L — ABNORMAL LOW (ref 3.5–5.1)
Sodium: 137 mmol/L (ref 135–145)
TCO2: 37 mmol/L — ABNORMAL HIGH (ref 22–32)
pCO2, Ven: 56.7 mmHg (ref 44–60)
pH, Ven: 7.406 (ref 7.25–7.43)
pO2, Ven: 30 mmHg — CL (ref 32–45)

## 2024-07-18 LAB — I-STAT CHEM 8, ED
BUN: 31 mg/dL — ABNORMAL HIGH (ref 8–23)
Calcium, Ion: 1.07 mmol/L — ABNORMAL LOW (ref 1.15–1.40)
Chloride: 98 mmol/L (ref 98–111)
Creatinine, Ser: 1 mg/dL (ref 0.44–1.00)
Glucose, Bld: 196 mg/dL — ABNORMAL HIGH (ref 70–99)
HCT: 37 % (ref 36.0–46.0)
Hemoglobin: 12.6 g/dL (ref 12.0–15.0)
Potassium: 3.6 mmol/L (ref 3.5–5.1)
Sodium: 136 mmol/L (ref 135–145)
TCO2: 31 mmol/L (ref 22–32)

## 2024-07-18 LAB — CBC WITH DIFFERENTIAL/PLATELET
Abs Immature Granulocytes: 0.11 K/uL — ABNORMAL HIGH (ref 0.00–0.07)
Basophils Absolute: 0.1 K/uL (ref 0.0–0.1)
Basophils Relative: 0 %
Eosinophils Absolute: 0.1 K/uL (ref 0.0–0.5)
Eosinophils Relative: 1 %
HCT: 37.5 % (ref 36.0–46.0)
Hemoglobin: 11.3 g/dL — ABNORMAL LOW (ref 12.0–15.0)
Immature Granulocytes: 1 %
Lymphocytes Relative: 30 %
Lymphs Abs: 4.6 K/uL — ABNORMAL HIGH (ref 0.7–4.0)
MCH: 28.8 pg (ref 26.0–34.0)
MCHC: 30.1 g/dL (ref 30.0–36.0)
MCV: 95.7 fL (ref 80.0–100.0)
Monocytes Absolute: 1.5 K/uL — ABNORMAL HIGH (ref 0.1–1.0)
Monocytes Relative: 9 %
Neutro Abs: 9.3 K/uL — ABNORMAL HIGH (ref 1.7–7.7)
Neutrophils Relative %: 59 %
Platelets: 393 K/uL (ref 150–400)
RBC: 3.92 MIL/uL (ref 3.87–5.11)
RDW: 16.4 % — ABNORMAL HIGH (ref 11.5–15.5)
WBC: 15.7 K/uL — ABNORMAL HIGH (ref 4.0–10.5)
nRBC: 0 % (ref 0.0–0.2)

## 2024-07-18 LAB — BASIC METABOLIC PANEL WITH GFR
Anion gap: 10 (ref 5–15)
BUN: 26 mg/dL — ABNORMAL HIGH (ref 8–23)
CO2: 34 mmol/L — ABNORMAL HIGH (ref 22–32)
Calcium: 9.8 mg/dL (ref 8.9–10.3)
Chloride: 96 mmol/L — ABNORMAL LOW (ref 98–111)
Creatinine, Ser: 0.85 mg/dL (ref 0.44–1.00)
GFR, Estimated: >60 mL/min
Glucose, Bld: 216 mg/dL — ABNORMAL HIGH (ref 70–99)
Potassium: 3.6 mmol/L (ref 3.5–5.1)
Sodium: 139 mmol/L (ref 135–145)

## 2024-07-18 LAB — PRO BRAIN NATRIURETIC PEPTIDE: Pro Brain Natriuretic Peptide: 214 pg/mL

## 2024-07-18 LAB — I-STAT CG4 LACTIC ACID, ED: Lactic Acid, Venous: 1.5 mmol/L (ref 0.5–1.9)

## 2024-07-18 LAB — TROPONIN T, HIGH SENSITIVITY: Troponin T High Sensitivity: 17 ng/L (ref 0–19)

## 2024-07-18 NOTE — ED Triage Notes (Addendum)
 Pt BIB [facility] from home c/o of shortness of breath. Per EMS pt was seen at [facility] yesterday for same thing. Was given prednisone , pt states only took one dose so far. EMS states lung sounds absent in 3 quadrants, rhonchi.   Hx of COPD - wear 2 liters 02 at home  Pt is smoker  18GLW  18GLFA   Ems vitals  140/61  89-105 HR  100% Sp02 on neb    1.5 Atrovent   125 mg solumedrol  20 mg albuterol   2 g magnesium   1.0 mg epi (given 0.5 mg x2)

## 2024-07-18 NOTE — ED Provider Notes (Addendum)
 " [place] EMERGENCY DEPARTMENT AT [facility] Provider Note   CSN: [id number] Arrival date & time: 07/18/24  2229     Patient presents with: Respiratory Distress   [name] [name] is a 69 y.o. female.   The history is provided by the patient and medical records.   69 year old female with history of asthma, COPD on chronic 4 L, CHF with preserved EF of 65 to 70%, bipolar disorder, chronic kidney disease, diabetes, presenting to the ED with respiratory distress.  Patient was seen at [facility] 07/16/2024 for same.  She had negative COVID and flu testing.  She was treated there and discharged home with prednisone .  Daughter helps with her medications and states she has only had 1 dose since that time.  Tried to go to the bathroom today and got so short of breath she almost passed out the floor.  She was given 10 mg albuterol , 1 mg Atrovent , 125 Solu-Medrol , 1mg  epi, and 2 g of magnesium .  She remains awake and talking on arrival but does appear to be fatiguing.  Prior to Admission medications  Medication Sig Start Date End Date Taking? Authorizing Provider  acetaminophen  (TYLENOL ) 500 MG tablet Take 1 tablet (500 mg total) by mouth every 6 (six) hours as needed for mild pain (pain score 1-3), fever or headache. 06/13/24   Arlice Reichert, MD  albuterol  (VENTOLIN  HFA) 108 (90 Base) MCG/ACT inhaler INHALE 2 PUFFS into THE lungs EVERY 6 HOURS AS NEEDED wheezing AND SHORTNESS OF BREATH 10/15/23   Vicci Barnie [name], MD  alendronate  (FOSAMAX ) 70 MG tablet Take 1 tablet (70 mg total) by mouth every 7 (seven) days. Take with a full glass of water on an empty stomach. 04/06/24   Vicci Barnie [name], MD  aspirin  EC 81 MG tablet Take 81 mg by mouth in the morning.    [provider]  atorvastatin  (LIPITOR ) 80 MG tablet Take 1 tablet (80 mg total) by mouth daily. 09/22/23   Daneen Damien BROCKS, NP  azithromycin  (ZITHROMAX ) 250 MG tablet Take 1 tablet (250 mg total) by mouth daily. Patient not taking:  Reported on 07/16/2024 06/24/24   Kara Dorn [name], MD  benzonatate  (TESSALON ) 100 MG capsule Take 1 capsule (100 mg total) by mouth 3 (three) times daily as needed for cough. Patient not taking: Reported on 07/16/2024 06/22/24   Singh, Prashant K, MD  busPIRone  (BUSPAR ) 15 MG tablet Take 2 tablets (30 mg total) by mouth in the morning AND 1 tablet (15 mg total) daily at 12 noon AND 1 tablet (15 mg total) every evening. Patient taking differently: Take 60mg  by mouth at bedtime 05/18/24   Nwoko, Uchenna E, PA  clonazePAM  (KLONOPIN ) 0.5 MG tablet Take 1 tablet (0.5 mg total) by mouth 2 (two) times daily as needed. Patient taking differently: Take 0.5 mg by mouth 2 (two) times daily. 06/03/24   Nwoko, Uchenna E, PA  doxepin  (SINEQUAN ) 75 MG capsule Take 1 capsule (75 mg total) by mouth at bedtime. 05/18/24   Nwoko, Uchenna E, PA  Dupilumab  (DUPIXENT ) 300 MG/2ML SOAJ Inject 300 mg into the skin every 14 (fourteen) days. 05/26/24   Kara Dorn [name], MD  empagliflozin  (JARDIANCE ) 10 MG TABS tablet Take 1 tablet (10 mg total) by mouth daily before breakfast. 07/13/24   Floretta Mallard, MD  escitalopram  (LEXAPRO ) 20 MG tablet Take 1 tablet (20 mg total) by mouth daily. 05/18/24   Nwoko, Uchenna E, PA  ezetimibe  (ZETIA ) 10 MG tablet Take 1 tablet (10  mg total) by mouth daily. 06/21/24   Meng, Hao, PA  Fluticasone -Umeclidin-Vilant (TRELEGY ELLIPTA ) 200-62.5-25 MCG/ACT AEPB Inhale 1 puff into the lungs daily. 07/02/24   Kara Dorn NOVAK, MD  gabapentin  (NEURONTIN ) 600 MG tablet Take 1 tablet (600 mg total) by mouth 2 (two) times daily. 12/04/23   Vicci Barnie NOVAK, MD  hydrOXYzine  (ATARAX ) 25 MG tablet Take 1 tablet (25 mg total) by mouth at bedtime as needed. 05/18/24   Nwoko, Uchenna E, PA  ipratropium-albuterol  (DUONEB) 0.5-2.5 (3) MG/3ML SOLN Take 3 mLs by nebulization every 8 (eight) hours. 01/15/24   Kara Dorn NOVAK, MD  levothyroxine  (SYNTHROID ) 112 MCG tablet Take 1 tablet (112 mcg total) by mouth daily.  01/22/24   Vicci Barnie NOVAK, MD  metFORMIN  (GLUCOPHAGE ) 500 MG tablet Take 1 tablet (500 mg total) by mouth 2 (two) times daily with a meal. Patient taking differently: Take 500 mg by mouth daily with breakfast. 03/12/24   Vicci Barnie NOVAK, MD  mirtazapine  (REMERON ) 45 MG tablet Take 1 tablet (45 mg total) by mouth at bedtime. 05/18/24   Nwoko, Uchenna E, PA  montelukast  (SINGULAIR ) 10 MG tablet Take 1 tablet (10 mg total) by mouth at bedtime. 11/11/23   Vicci Barnie NOVAK, MD  nebivolol  (BYSTOLIC ) 10 MG tablet Take 1 tablet (10 mg total) by mouth daily. 09/22/23   Daneen Damien BROCKS, NP  nicotine  (NICODERM CQ  - DOSED IN MG/24 HOURS) 21 mg/24hr patch Place 1 patch (21 mg total) onto the skin daily. Patient not taking: Reported on 07/16/2024 06/22/24   Singh, Prashant K, MD  nitroGLYCERIN  (NITROSTAT ) 0.4 MG SL tablet Place 1 tablet (0.4 mg total) under the tongue every 5 (five) minutes as needed for chest pain. 04/15/24 07/16/24  Daneen Damien BROCKS, NP  OHTUVAYRE  3 MG/2.5ML SUSP Take 1 vial by nebulization in the morning and at bedtime. 05/19/24   [provider]  omeprazole  (PRILOSEC) 40 MG capsule Take 1 capsule (40 mg total) by mouth 2 (two) times daily before a meal. NEED APPOINTMENT FOR ADDITIONAL REFILLS 04/21/24   Legrand Victory LITTIE DOUGLAS, MD  OXYGEN  Inhale 4 L/min into the lungs continuous.    [provider]  predniSONE  (DELTASONE ) 10 MG tablet Take 4 tablets (40 mg total) by mouth daily with breakfast for 1 day, THEN 3 tablets (30 mg total) daily with breakfast for 2 days, THEN 2 tablets (20 mg total) daily with breakfast for 2 days, THEN 1 tablet (10 mg total) daily with breakfast for 1 day. 07/16/24 07/23/24  Veta Palma, PA-C  ticagrelor  (BRILINTA ) 60 MG TABS tablet Take 1 tablet (60 mg total) by mouth 2 (two) times daily. 12/11/23   Burnard Debby LABOR, MD  torsemide  (DEMADEX ) 20 MG tablet Take 2 tablets (40 mg total) by mouth daily. 07/13/24   Floretta Mallard, MD  varenicline  (CHANTIX ) 1 MG  tablet Take 1 tablet (1 mg total) by mouth 2 (two) times daily. 04/21/24   Kara Dorn NOVAK, MD    Allergies: Avocado, Latex, and Codeine     Review of Systems  Respiratory:  Positive for shortness of breath and wheezing.   All other systems reviewed and are negative.   Updated Vital Signs SpO2 100%   Physical Exam Vitals and nursing note reviewed.  Constitutional:      Appearance: She is well-developed.     Comments: Awake, talking, but appearing fatigued  HENT:     Head: Normocephalic and atraumatic.  Eyes:     Conjunctiva/sclera: Conjunctivae normal.  Pupils: Pupils are equal, round, and reactive to light.  Cardiovascular:     Rate and Rhythm: Normal rate and regular rhythm.     Heart sounds: Normal heart sounds.  Pulmonary:     Effort: Tachypnea, accessory muscle usage, respiratory distress and retractions present.     Breath sounds: Wheezing present.     Comments: Increased WOB with retractions, very wet cough, lungs are very tight, some wheezing heard Abdominal:     General: Bowel sounds are normal.     Palpations: Abdomen is soft.  Musculoskeletal:        General: Normal range of motion.     Cervical back: Normal range of motion.  Skin:    General: Skin is warm and dry.  Neurological:     Mental Status: She is alert and oriented to person, place, and time.     (all labs ordered are listed, but only abnormal results are displayed) Labs Reviewed  CBC WITH DIFFERENTIAL/PLATELET - Abnormal; Notable for the following components:      Result Value   WBC 15.7 (*)    Hemoglobin 11.3 (*)    RDW 16.4 (*)    Neutro Abs 9.3 (*)    Lymphs Abs 4.6 (*)    Monocytes Absolute 1.5 (*)    Abs Immature Granulocytes 0.11 (*)    All other components within normal limits  BASIC METABOLIC PANEL WITH GFR - Abnormal; Notable for the following components:   Chloride 96 (*)    CO2 34 (*)    Glucose, Bld 216 (*)    BUN 26 (*)    All other components within normal limits   I-STAT ARTERIAL BLOOD GAS, ED - Abnormal; Notable for the following components:   pH, Arterial 7.349 (*)    pCO2 arterial 62.0 (*)    Bicarbonate 34.2 (*)    TCO2 36 (*)    Acid-Base Excess 7.0 (*)    Potassium 3.4 (*)    HCT 34.0 (*)    Hemoglobin 11.6 (*)    All other components within normal limits  I-STAT VENOUS BLOOD GAS, ED - Abnormal; Notable for the following components:   pO2, Ven 30 (*)    Bicarbonate 35.6 (*)    TCO2 37 (*)    Acid-Base Excess 9.0 (*)    Potassium 3.4 (*)    Calcium , Ion 1.11 (*)    All other components within normal limits  I-STAT CHEM 8, ED - Abnormal; Notable for the following components:   BUN 31 (*)    Glucose, Bld 196 (*)    Calcium , Ion 1.07 (*)    All other components within normal limits  RESP PANEL BY RT-PCR (RSV, FLU A&B, COVID)  RVPGX2  PRO BRAIN NATRIURETIC PEPTIDE  I-STAT CG4 LACTIC ACID, ED  TROPONIN T, HIGH SENSITIVITY    EKG: None  Radiology: DG Chest Port 1 View Result Date: 07/18/2024 EXAM: 1 VIEW(S) XRAY OF THE CHEST 07/18/2024 10:53:39 PM COMPARISON: 07/16/2024 CLINICAL HISTORY: SOB, Cough. FINDINGS: LUNGS AND PLEURA: No focal pulmonary opacity. No pleural effusion. No pneumothorax. HEART AND MEDIASTINUM: No acute abnormality of the cardiac and mediastinal silhouettes. BONES AND SOFT TISSUES: No acute osseous abnormality. IMPRESSION: 1. No active cardiopulmonary disease. Electronically signed by: Franky Crease MD 07/18/2024 10:55 PM EST RP Workstation: [id number]     Procedures   CRITICAL CARE Performed by: Olam CHRISTELLA Slocumb   Total critical care time: 45 minutes  Critical care time was exclusive of separately billable procedures and treating other  patients.  Critical care was necessary to treat or prevent imminent or life-threatening deterioration.  Critical care was time spent personally by me on the following activities: development of treatment plan with patient and/or surrogate as well as nursing, discussions with  consultants, evaluation of patient's response to treatment, examination of patient, obtaining history from patient or surrogate, ordering and performing treatments and interventions, ordering and review of laboratory studies, ordering and review of radiographic studies, pulse oximetry and re-evaluation of patient's condition.   Medications Ordered in the ED - No data to display                                  Medical Decision Making Amount and/or Complexity of Data Reviewed Labs: ordered. Radiology: ordered and independent interpretation performed. ECG/medicine tests: ordered and independent interpretation performed.  Risk Decision regarding hospitalization.   69 year old female presenting to the ED with shortness of breath.  Longstanding history of COPD, on chronic home O2.  Seen at [facility] 2 days ago and discharged with prednisone .  Appears to have had worsening symptoms.  Multiple interventions with EMS with minor improvement.  Recent negative covid/flu testing on 07/16/24.  She is afebrile and nontoxic here.  She is currently on neb but appears distressed with increased work of breathing, retractions, and appears to be fatiguing.  Lungs are very tight but does have some intermittent wheezes.  She was transition to BiPAP.  Awaiting labs, ABG, and chest x-ray.  Anticipate admission.  ABG with pCO2 of 62.  Normal lactate.  Does have leukocytosis but has been on prednisone .  She has no significant electrolyte derangement.  Chest x-ray is clear.  Normal troponin and BNP.  11:19 PM She is tolerating BiPAP without difficulty.  Work of breathing has improved.  Will discuss with hospitalist for admission.  Discussed with Dr. Adefeso-- will admit for ongoing care.  Final diagnoses:  COPD exacerbation [facility])    ED Discharge Orders     None          Jarold Olam HERO, PA-C 07/19/24 0020    Jarold Olam HERO, PA-C 07/19/24 RED Charlyn Sora, MD 07/22/24 1427  "

## 2024-07-19 ENCOUNTER — Other Ambulatory Visit: Payer: Self-pay

## 2024-07-19 ENCOUNTER — Encounter: Payer: Self-pay | Admitting: Hematology

## 2024-07-19 ENCOUNTER — Ambulatory Visit: Admitting: Podiatry

## 2024-07-19 ENCOUNTER — Telehealth (HOSPITAL_COMMUNITY): Payer: Self-pay

## 2024-07-19 DIAGNOSIS — E1165 Type 2 diabetes mellitus with hyperglycemia: Secondary | ICD-10-CM

## 2024-07-19 DIAGNOSIS — E876 Hypokalemia: Secondary | ICD-10-CM | POA: Diagnosis not present

## 2024-07-19 DIAGNOSIS — K219 Gastro-esophageal reflux disease without esophagitis: Secondary | ICD-10-CM

## 2024-07-19 DIAGNOSIS — J9621 Acute and chronic respiratory failure with hypoxia: Secondary | ICD-10-CM | POA: Diagnosis present

## 2024-07-19 DIAGNOSIS — N182 Chronic kidney disease, stage 2 (mild): Secondary | ICD-10-CM | POA: Diagnosis present

## 2024-07-19 DIAGNOSIS — Z8249 Family history of ischemic heart disease and other diseases of the circulatory system: Secondary | ICD-10-CM | POA: Diagnosis not present

## 2024-07-19 DIAGNOSIS — B348 Other viral infections of unspecified site: Secondary | ICD-10-CM | POA: Diagnosis not present

## 2024-07-19 DIAGNOSIS — E039 Hypothyroidism, unspecified: Secondary | ICD-10-CM | POA: Diagnosis present

## 2024-07-19 DIAGNOSIS — Z66 Do not resuscitate: Secondary | ICD-10-CM | POA: Diagnosis present

## 2024-07-19 DIAGNOSIS — I5032 Chronic diastolic (congestive) heart failure: Secondary | ICD-10-CM | POA: Diagnosis present

## 2024-07-19 DIAGNOSIS — I2510 Atherosclerotic heart disease of native coronary artery without angina pectoris: Secondary | ICD-10-CM | POA: Diagnosis present

## 2024-07-19 DIAGNOSIS — J441 Chronic obstructive pulmonary disease with (acute) exacerbation: Secondary | ICD-10-CM | POA: Diagnosis present

## 2024-07-19 DIAGNOSIS — F325 Major depressive disorder, single episode, in full remission: Secondary | ICD-10-CM | POA: Diagnosis present

## 2024-07-19 DIAGNOSIS — E782 Mixed hyperlipidemia: Secondary | ICD-10-CM | POA: Diagnosis present

## 2024-07-19 DIAGNOSIS — H548 Legal blindness, as defined in USA: Secondary | ICD-10-CM | POA: Diagnosis present

## 2024-07-19 DIAGNOSIS — Z79890 Hormone replacement therapy (postmenopausal): Secondary | ICD-10-CM | POA: Diagnosis not present

## 2024-07-19 DIAGNOSIS — E66812 Obesity, class 2: Secondary | ICD-10-CM | POA: Diagnosis present

## 2024-07-19 DIAGNOSIS — E873 Alkalosis: Secondary | ICD-10-CM | POA: Diagnosis not present

## 2024-07-19 DIAGNOSIS — E1122 Type 2 diabetes mellitus with diabetic chronic kidney disease: Secondary | ICD-10-CM | POA: Diagnosis present

## 2024-07-19 DIAGNOSIS — J9622 Acute and chronic respiratory failure with hypercapnia: Secondary | ICD-10-CM | POA: Diagnosis not present

## 2024-07-19 DIAGNOSIS — Z7984 Long term (current) use of oral hypoglycemic drugs: Secondary | ICD-10-CM | POA: Diagnosis not present

## 2024-07-19 DIAGNOSIS — D72829 Elevated white blood cell count, unspecified: Secondary | ICD-10-CM

## 2024-07-19 DIAGNOSIS — F411 Generalized anxiety disorder: Secondary | ICD-10-CM | POA: Diagnosis present

## 2024-07-19 DIAGNOSIS — I5030 Unspecified diastolic (congestive) heart failure: Secondary | ICD-10-CM | POA: Diagnosis not present

## 2024-07-19 DIAGNOSIS — F419 Anxiety disorder, unspecified: Secondary | ICD-10-CM

## 2024-07-19 DIAGNOSIS — Z6832 Body mass index (BMI) 32.0-32.9, adult: Secondary | ICD-10-CM | POA: Diagnosis not present

## 2024-07-19 DIAGNOSIS — F17210 Nicotine dependence, cigarettes, uncomplicated: Secondary | ICD-10-CM | POA: Diagnosis present

## 2024-07-19 DIAGNOSIS — I130 Hypertensive heart and chronic kidney disease with heart failure and stage 1 through stage 4 chronic kidney disease, or unspecified chronic kidney disease: Secondary | ICD-10-CM | POA: Diagnosis present

## 2024-07-19 DIAGNOSIS — E1142 Type 2 diabetes mellitus with diabetic polyneuropathy: Secondary | ICD-10-CM | POA: Diagnosis present

## 2024-07-19 LAB — RESPIRATORY PANEL BY PCR

## 2024-07-19 LAB — RESP PANEL BY RT-PCR (RSV, FLU A&B, COVID)  RVPGX2
Influenza A by PCR: NEGATIVE
Influenza B by PCR: NEGATIVE
Resp Syncytial Virus by PCR: NEGATIVE
SARS Coronavirus 2 by RT PCR: NEGATIVE

## 2024-07-19 LAB — COMPREHENSIVE METABOLIC PANEL WITH GFR
ALT: 29 U/L (ref 0–44)
AST: 18 U/L (ref 15–41)
Albumin: 4 g/dL (ref 3.5–5.0)
Alkaline Phosphatase: 83 U/L (ref 38–126)
Anion gap: 10 (ref 5–15)
BUN: 25 mg/dL — ABNORMAL HIGH (ref 8–23)
CO2: 30 mmol/L (ref 22–32)
Calcium: 9.4 mg/dL (ref 8.9–10.3)
Chloride: 99 mmol/L (ref 98–111)
Creatinine, Ser: 0.78 mg/dL (ref 0.44–1.00)
GFR, Estimated: >60 mL/min
Glucose, Bld: 266 mg/dL — ABNORMAL HIGH (ref 70–99)
Potassium: 3.9 mmol/L (ref 3.5–5.1)
Sodium: 139 mmol/L (ref 135–145)
Total Bilirubin: <0.2 mg/dL (ref 0.0–1.2)
Total Protein: 6.5 g/dL (ref 6.5–8.1)

## 2024-07-19 LAB — CBC
HCT: 34.7 % — ABNORMAL LOW (ref 36.0–46.0)
Hemoglobin: 10.5 g/dL — ABNORMAL LOW (ref 12.0–15.0)
MCH: 28.6 pg (ref 26.0–34.0)
MCHC: 30.3 g/dL (ref 30.0–36.0)
MCV: 94.6 fL (ref 80.0–100.0)
Platelets: 407 K/uL — ABNORMAL HIGH (ref 150–400)
RBC: 3.67 MIL/uL — ABNORMAL LOW (ref 3.87–5.11)
RDW: 16.3 % — ABNORMAL HIGH (ref 11.5–15.5)
WBC: 18.9 K/uL — ABNORMAL HIGH (ref 4.0–10.5)
nRBC: 0 % (ref 0.0–0.2)

## 2024-07-19 LAB — MAGNESIUM: Magnesium: 2.3 mg/dL (ref 1.7–2.4)

## 2024-07-19 LAB — GLUCOSE, CAPILLARY
Glucose-Capillary: 148 mg/dL — ABNORMAL HIGH (ref 70–99)
Glucose-Capillary: 120 mg/dL — ABNORMAL HIGH (ref 70–99)

## 2024-07-19 LAB — PHOSPHORUS: Phosphorus: 2.9 mg/dL (ref 2.5–4.6)

## 2024-07-19 LAB — CBG MONITORING, ED
Glucose-Capillary: 198 mg/dL — ABNORMAL HIGH (ref 70–99)
Glucose-Capillary: 153 mg/dL — ABNORMAL HIGH (ref 70–99)

## 2024-07-19 MED ORDER — BUSPIRONE HCL 15 MG PO TABS
15.0000 mg | ORAL_TABLET | Freq: Every evening | ORAL | Status: DC
  Administered 2024-07-19: 15 mg via ORAL
  Filled 2024-07-19 (×2): qty 3

## 2024-07-19 MED ORDER — NEBIVOLOL HCL 10 MG PO TABS
10.0000 mg | ORAL_TABLET | Freq: Every day | ORAL | Status: DC
  Administered 2024-07-19 – 2024-07-26 (×8): 10 mg via ORAL
  Filled 2024-07-19 (×9): qty 1

## 2024-07-19 MED ORDER — PROCHLORPERAZINE EDISYLATE 10 MG/2ML IJ SOLN: 10.0000 mg | Freq: Four times a day (QID) | INTRAMUSCULAR | Status: DC | PRN

## 2024-07-19 MED ORDER — LEVOTHYROXINE SODIUM 112 MCG PO TABS
112.0000 ug | ORAL_TABLET | Freq: Every day | ORAL | Status: DC
  Administered 2024-07-19 – 2024-07-26 (×8): 112 ug via ORAL
  Filled 2024-07-19 (×9): qty 1

## 2024-07-19 MED ORDER — ASPIRIN 81 MG PO TBEC
81.0000 mg | DELAYED_RELEASE_TABLET | Freq: Every morning | ORAL | Status: DC
  Administered 2024-07-19 – 2024-07-26 (×8): 81 mg via ORAL
  Filled 2024-07-19 (×9): qty 1

## 2024-07-19 MED ORDER — ENOXAPARIN SODIUM 40 MG/0.4ML IJ SOSY
40.0000 mg | PREFILLED_SYRINGE | INTRAMUSCULAR | Status: DC
  Administered 2024-07-19: 40 mg via SUBCUTANEOUS
  Filled 2024-07-19: qty 0.4

## 2024-07-19 MED ORDER — ACETAMINOPHEN 650 MG RE SUPP: 650.0000 mg | Freq: Four times a day (QID) | RECTAL | Status: DC | PRN

## 2024-07-19 MED ORDER — EZETIMIBE 10 MG PO TABS
10.0000 mg | ORAL_TABLET | Freq: Every day | ORAL | Status: DC
  Administered 2024-07-19 – 2024-07-26 (×8): 10 mg via ORAL
  Filled 2024-07-19 (×10): qty 1

## 2024-07-19 MED ORDER — CLONAZEPAM 0.5 MG PO TABS
0.5000 mg | ORAL_TABLET | Freq: Two times a day (BID) | ORAL | Status: DC | PRN
  Administered 2024-07-19 – 2024-07-25 (×7): 0.5 mg via ORAL
  Filled 2024-07-19 (×11): qty 1

## 2024-07-19 MED ORDER — INSULIN ASPART 100 UNIT/ML IJ SOLN
0.0000 [IU] | Freq: Three times a day (TID) | INTRAMUSCULAR | Status: DC
  Administered 2024-07-19 (×2): 3 [IU] via SUBCUTANEOUS
  Administered 2024-07-19: 2 [IU] via SUBCUTANEOUS
  Filled 2024-07-19: qty 1
  Filled 2024-07-19 (×2): qty 3

## 2024-07-19 MED ORDER — BUSPIRONE HCL 15 MG PO TABS
30.0000 mg | ORAL_TABLET | Freq: Every morning | ORAL | Status: DC
  Administered 2024-07-19 – 2024-07-20 (×2): 30 mg via ORAL
  Filled 2024-07-19 (×2): qty 6
  Filled 2024-07-19: qty 3

## 2024-07-19 MED ORDER — IPRATROPIUM-ALBUTEROL 0.5-2.5 (3) MG/3ML IN SOLN
3.0000 mL | RESPIRATORY_TRACT | Status: DC | PRN
  Administered 2024-07-19 – 2024-07-20 (×3): 3 mL via RESPIRATORY_TRACT
  Filled 2024-07-19 (×10): qty 3

## 2024-07-19 MED ORDER — ATORVASTATIN CALCIUM 80 MG PO TABS
80.0000 mg | ORAL_TABLET | Freq: Every day | ORAL | Status: DC
  Administered 2024-07-19 – 2024-07-26 (×8): 80 mg via ORAL
  Filled 2024-07-19 (×7): qty 1
  Filled 2024-07-19: qty 2
  Filled 2024-07-19: qty 1

## 2024-07-19 MED ORDER — IPRATROPIUM-ALBUTEROL 0.5-2.5 (3) MG/3ML IN SOLN
3.0000 mL | Freq: Four times a day (QID) | RESPIRATORY_TRACT | Status: DC
  Administered 2024-07-19 – 2024-07-20 (×5): 3 mL via RESPIRATORY_TRACT
  Filled 2024-07-19 (×5): qty 3

## 2024-07-19 MED ORDER — ACETAMINOPHEN 325 MG PO TABS
650.0000 mg | ORAL_TABLET | Freq: Four times a day (QID) | ORAL | Status: DC | PRN
  Administered 2024-07-19 (×2): 650 mg via ORAL
  Filled 2024-07-19 (×2): qty 2

## 2024-07-19 MED ORDER — METHYLPREDNISOLONE SODIUM SUCC 125 MG IJ SOLR
80.0000 mg | INTRAMUSCULAR | Status: DC
  Administered 2024-07-19: 80 mg via INTRAVENOUS
  Filled 2024-07-19: qty 2

## 2024-07-19 MED ORDER — NICOTINE 21 MG/24HR TD PT24
21.0000 mg | MEDICATED_PATCH | Freq: Every day | TRANSDERMAL | Status: DC
  Administered 2024-07-19 – 2024-07-26 (×8): 21 mg via TRANSDERMAL
  Filled 2024-07-19 (×9): qty 1

## 2024-07-19 MED ORDER — PANTOPRAZOLE SODIUM 40 MG PO TBEC
40.0000 mg | DELAYED_RELEASE_TABLET | Freq: Every day | ORAL | Status: DC
  Administered 2024-07-19 – 2024-07-26 (×8): 40 mg via ORAL
  Filled 2024-07-19 (×8): qty 1

## 2024-07-19 MED ORDER — DM-GUAIFENESIN ER 30-600 MG PO TB12
1.0000 | ORAL_TABLET | Freq: Two times a day (BID) | ORAL | Status: DC
  Administered 2024-07-19 (×3): 1 via ORAL
  Filled 2024-07-19 (×4): qty 1

## 2024-07-19 MED ORDER — BUSPIRONE HCL 15 MG PO TABS
15.0000 mg | ORAL_TABLET | Freq: Every day | ORAL | Status: DC
  Administered 2024-07-19: 15 mg via ORAL
  Filled 2024-07-19: qty 2
  Filled 2024-07-19: qty 3

## 2024-07-19 MED ORDER — DOXYCYCLINE HYCLATE 100 MG PO TABS
100.0000 mg | ORAL_TABLET | Freq: Two times a day (BID) | ORAL | Status: AC
  Administered 2024-07-19 – 2024-07-23 (×10): 100 mg via ORAL
  Filled 2024-07-19 (×12): qty 1

## 2024-07-19 MED ORDER — GABAPENTIN 300 MG PO CAPS
600.0000 mg | ORAL_CAPSULE | Freq: Two times a day (BID) | ORAL | Status: DC
  Administered 2024-07-19 – 2024-07-26 (×15): 600 mg via ORAL
  Filled 2024-07-19 (×18): qty 2

## 2024-07-19 MED ORDER — TICAGRELOR 60 MG PO TABS
60.0000 mg | ORAL_TABLET | Freq: Two times a day (BID) | ORAL | Status: DC
  Administered 2024-07-19 – 2024-07-26 (×14): 60 mg via ORAL
  Filled 2024-07-19 (×20): qty 1

## 2024-07-19 MED ORDER — METHYLPREDNISOLONE SODIUM SUCC 40 MG IJ SOLR: 40.0000 mg | Freq: Two times a day (BID) | INTRAMUSCULAR | Status: DC

## 2024-07-19 NOTE — H&P (Signed)
 " History and Physical    Patient: [name] [name] FMW:[id number] DOB: [birth year] DOA: 07/18/2024 DOS: the patient was seen and examined on 07/19/2024 PCP: Vicci Barnie [name], MD  Patient coming from: Home  Chief Complaint:  Chief Complaint  Patient presents with   Respiratory Distress   HPI: [name] [name] is a 69 y.o. female with medical history significant of hypertension, hyperlipidemia, T2DM, diabetic polyneuropathy, prolonged QTc, anxiety, depression, GERD, hypothyroidism, COPD, chronic hypoxic respiratory failure on 4 LPM of oxygen , chronic tobacco abuse who presents emergency department due to respiratory distress.  She was seen at [facility] on 07/16/2024 for same symptoms.  COVID and flu test done with negative and she was treated at the ED and discharged home with prednisone .  She has only taken 1 dose since that time. Shortness of breath worsened while going to the bathroom at home today and EMS was activated.  On arrival of EMS team, breathing treatment with albuterol  and Atrovent  were provided.  Solu-Medrol  125 mg x 1, magnesium  and epi were given. Patient was recently admitted from 12/4 to 12/9 due to acute on chronic hypoxic respiratory failure due to COPD expiration and acute bronchitis and was treated with IV steroids, azithromycin , nebulizer treatments.   ED course In the emergency department, temperature was 37.2 F, BP 152/48, O2 sat was 100% on BiPAP with FiO2 50%.  Workup in the ED showed normocytic anemia and leukocytosis. Chest x-ray showed streaky atelectasis or scarring at the left base. TR[facility] was asked to admit patient.   Review of Systems: As mentioned in the history of present illness. All other systems reviewed and are negative. Past Medical History:  Diagnosis Date   Allergy    seasonal   Anemia    Anxiety    takes Lexapro  daily   Arthritis    back, from neck down pass my bra area (03/25/2017)   Asthma    Bartholin gland cyst 08/29/2011   Bruises easily    pt  is on Effient    Chronic back pain    herniated nucleus pulposus   Chronic back pain    neck to bra area; lower back (03/25/2017)   Chronic kidney disease    recurrent UTI's this year 2022   COPD (chronic obstructive pulmonary disease) (HCC)    early stages   Coronary artery disease    Depression    takes Klonopin  daily   Diabetes mellitus without complication (HCC)    Diverticulosis    Fibroadenoma of left breast    GERD (gastroesophageal reflux disease)    takes Nexium  daily   H/O hiatal hernia    Heart attack (HCC) 2011   Hemorrhoids    Hernia    Hyperlipidemia    takes Lipitor  daily   Hypertension    takes Losartan  daily and Labetalol  bid   Hypothyroidism    takes Synthroid  daily   Insomnia    hydroxyzine  prn   Joint pain    Pneumonia    couple times (03/25/2017)   Pre-diabetes    just found out 1 wk ago (03/25/2017)   Psoriasis    elbows,knees,back   Shortness of breath    with exertion   Slowing of urinary stream    Stress incontinence    Past Surgical History:  Procedure Laterality Date   ABDOMINAL EXPLORATION SURGERY  1977   ABDOMINAL HYSTERECTOMY  1977   left one of my ovaries   BACK SURGERY     BIOPSY  05/27/2019   Procedure:  BIOPSY;  Surgeon: Shaaron Lamar HERO, MD;  Location: [facility];  Service: Endoscopy;;   BREAST BIOPSY Left 11/2021   x2   COLONOSCOPY  2011   Dr. Teressa: Mild diverticulosis, descending diminutive colon polyp (not retrieved), next colonoscopy 10 years   CORONARY ANGIOPLASTY WITH STENT PLACEMENT  2011 X2   regular stents didn't work; had to go back in in ~ 1 month and put in medicated stents   DILATION AND CURETTAGE OF UTERUS     ESOPHAGOGASTRODUODENOSCOPY     ESOPHAGOGASTRODUODENOSCOPY (EGD) WITH PROPOFOL  N/A 05/27/2019   normal esophagus, dilation, erosive gastropathy s/p biopsy, normal duodenum. Negative H.pylori.    LEFT HEART CATH AND CORONARY ANGIOGRAPHY N/A 03/26/2017   Procedure: LEFT HEART CATH AND CORONARY  ANGIOGRAPHY;  Surgeon: Claudene Victory ORN, MD;  Location: [facility];  Service: Cardiovascular;  Laterality: N/A;   LUMBAR LAMINECTOMY/DECOMPRESSION MICRODISCECTOMY  08/05/2011   Procedure: LUMBAR LAMINECTOMY/DECOMPRESSION MICRODISCECTOMY;  Surgeon: Lynwood JONELLE Mill, MD;  Location: [facility];  Service: Neurosurgery;  Laterality: Right;  Right Lumbar four-five extraforaminal discectomy   MALONEY DILATION N/A 05/27/2019   Procedure: AGAPITO DILATION;  Surgeon: Shaaron Lamar HERO, MD;  Location: [facility];  Service: Endoscopy;  Laterality: N/A;  54   SHOULDER ARTHROSCOPY WITH ROTATOR CUFF REPAIR AND OPEN BICEPS TENODESIS Right 12/24/2021   Procedure: RIGHT SHOULDER ARTHROSCOPY WITH ROTATOR CUFF REPAIR AND BICEPS TENODESIS;  Surgeon: Barbarann Oneil BROCKS, MD;  Location: [facility];  Service: Orthopedics;  Laterality: Right;   TONSILLECTOMY     as a child   UPPER GASTROINTESTINAL ENDOSCOPY     Social History:  reports that she has been smoking cigarettes. She has a 25 pack-year smoking history. She has never used smokeless tobacco. She reports current alcohol use. She reports current drug use. Drug: Marijuana.  Allergies[1]  Family History  Problem Relation Age of Onset   Heart disease Mother    Hypertension Mother    Stroke Mother    Mental illness Mother    Heart disease Father    Hypertension Father    Diabetes Father    Colon polyps Brother    Heart disease Maternal Grandfather    Cancer Paternal Grandmother        mouth   Anesthesia problems Daughter    Breast cancer Maternal Aunt    Throat cancer Maternal Uncle    Thyroid  disease Paternal Aunt    Hypotension Neg Hx    Malignant hyperthermia Neg Hx    Pseudochol deficiency Neg Hx    Colon cancer Neg Hx    Stomach cancer Neg Hx    Esophageal cancer Neg Hx    Pancreatic cancer Neg Hx    Rectal cancer Neg Hx     Prior to Admission medications  Medication Sig Start Date End Date Taking? Authorizing Provider  acetaminophen  (TYLENOL )  500 MG tablet Take 1 tablet (500 mg total) by mouth every 6 (six) hours as needed for mild pain (pain score 1-3), fever or headache. 06/13/24   Arlice Reichert, MD  albuterol  (VENTOLIN  HFA) 108 (90 Base) MCG/ACT inhaler INHALE 2 PUFFS into THE lungs EVERY 6 HOURS AS NEEDED wheezing AND SHORTNESS OF BREATH 10/15/23   Vicci Barnie NOVAK, MD  alendronate  (FOSAMAX ) 70 MG tablet Take 1 tablet (70 mg total) by mouth every 7 (seven) days. Take with a full glass of water on an empty stomach. 04/06/24   Vicci Barnie NOVAK, MD  aspirin  EC 81 MG tablet Take 81 mg by  mouth in the morning.    [provider]  atorvastatin  (LIPITOR ) 80 MG tablet Take 1 tablet (80 mg total) by mouth daily. 09/22/23   Daneen Damien BROCKS, NP  azithromycin  (ZITHROMAX ) 250 MG tablet Take 1 tablet (250 mg total) by mouth daily. Patient not taking: Reported on 07/16/2024 06/24/24   Kara Dorn NOVAK, MD  benzonatate  (TESSALON ) 100 MG capsule Take 1 capsule (100 mg total) by mouth 3 (three) times daily as needed for cough. Patient not taking: Reported on 07/16/2024 06/22/24   Singh, Prashant K, MD  busPIRone  (BUSPAR ) 15 MG tablet Take 2 tablets (30 mg total) by mouth in the morning AND 1 tablet (15 mg total) daily at 12 noon AND 1 tablet (15 mg total) every evening. Patient taking differently: Take 60mg  by mouth at bedtime 05/18/24   Nwoko, Uchenna E, PA  clonazePAM  (KLONOPIN ) 0.5 MG tablet Take 1 tablet (0.5 mg total) by mouth 2 (two) times daily as needed. Patient taking differently: Take 0.5 mg by mouth 2 (two) times daily. 06/03/24   Nwoko, Uchenna E, PA  doxepin  (SINEQUAN ) 75 MG capsule Take 1 capsule (75 mg total) by mouth at bedtime. 05/18/24   Nwoko, Uchenna E, PA  Dupilumab  (DUPIXENT ) 300 MG/2ML SOAJ Inject 300 mg into the skin every 14 (fourteen) days. 05/26/24   Kara Dorn NOVAK, MD  empagliflozin  (JARDIANCE ) 10 MG TABS tablet Take 1 tablet (10 mg total) by mouth daily before breakfast. 07/13/24   Floretta Mallard, MD  escitalopram   (LEXAPRO ) 20 MG tablet Take 1 tablet (20 mg total) by mouth daily. 05/18/24   Nwoko, Uchenna E, PA  ezetimibe  (ZETIA ) 10 MG tablet Take 1 tablet (10 mg total) by mouth daily. 06/21/24   Meng, Hao, PA  Fluticasone -Umeclidin-Vilant (TRELEGY ELLIPTA ) 200-62.5-25 MCG/ACT AEPB Inhale 1 puff into the lungs daily. 07/02/24   Kara Dorn NOVAK, MD  gabapentin  (NEURONTIN ) 600 MG tablet Take 1 tablet (600 mg total) by mouth 2 (two) times daily. 12/04/23   Vicci Barnie NOVAK, MD  hydrOXYzine  (ATARAX ) 25 MG tablet Take 1 tablet (25 mg total) by mouth at bedtime as needed. 05/18/24   Nwoko, Uchenna E, PA  ipratropium-albuterol  (DUONEB) 0.5-2.5 (3) MG/3ML SOLN Take 3 mLs by nebulization every 8 (eight) hours. 01/15/24   Kara Dorn NOVAK, MD  levothyroxine  (SYNTHROID ) 112 MCG tablet Take 1 tablet (112 mcg total) by mouth daily. 01/22/24   Vicci Barnie NOVAK, MD  metFORMIN  (GLUCOPHAGE ) 500 MG tablet Take 1 tablet (500 mg total) by mouth 2 (two) times daily with a meal. Patient taking differently: Take 500 mg by mouth daily with breakfast. 03/12/24   Vicci Barnie NOVAK, MD  mirtazapine  (REMERON ) 45 MG tablet Take 1 tablet (45 mg total) by mouth at bedtime. 05/18/24   Nwoko, Uchenna E, PA  montelukast  (SINGULAIR ) 10 MG tablet Take 1 tablet (10 mg total) by mouth at bedtime. 11/11/23   Vicci Barnie NOVAK, MD  nebivolol  (BYSTOLIC ) 10 MG tablet Take 1 tablet (10 mg total) by mouth daily. 09/22/23   Daneen Damien BROCKS, NP  nicotine  (NICODERM CQ  - DOSED IN MG/24 HOURS) 21 mg/24hr patch Place 1 patch (21 mg total) onto the skin daily. Patient not taking: Reported on 07/16/2024 06/22/24   Singh, Prashant K, MD  nitroGLYCERIN  (NITROSTAT ) 0.4 MG SL tablet Place 1 tablet (0.4 mg total) under the tongue every 5 (five) minutes as needed for chest pain. 04/15/24 07/16/24  Daneen Damien BROCKS, NP  OHTUVAYRE  3 MG/2.5ML SUSP Take 1 vial by nebulization in  the morning and at bedtime. 05/19/24   [provider]  omeprazole  (PRILOSEC) 40 MG capsule  Take 1 capsule (40 mg total) by mouth 2 (two) times daily before a meal. NEED APPOINTMENT FOR ADDITIONAL REFILLS 04/21/24   Legrand Victory LITTIE DOUGLAS, MD  OXYGEN  Inhale 4 L/min into the lungs continuous.    [provider]  predniSONE  (DELTASONE ) 10 MG tablet Take 4 tablets (40 mg total) by mouth daily with breakfast for 1 day, THEN 3 tablets (30 mg total) daily with breakfast for 2 days, THEN 2 tablets (20 mg total) daily with breakfast for 2 days, THEN 1 tablet (10 mg total) daily with breakfast for 1 day. 07/16/24 07/23/24  Veta Palma, PA-C  ticagrelor  (BRILINTA ) 60 MG TABS tablet Take 1 tablet (60 mg total) by mouth 2 (two) times daily. 12/11/23   Burnard Debby LABOR, MD  torsemide  (DEMADEX ) 20 MG tablet Take 2 tablets (40 mg total) by mouth daily. 07/13/24   Floretta Mallard, MD  varenicline  (CHANTIX ) 1 MG tablet Take 1 tablet (1 mg total) by mouth 2 (two) times daily. 04/21/24   Kara Dorn NOVAK, MD    Physical Exam: Vitals:   07/18/24 2240 07/18/24 2241 07/18/24 2243 07/18/24 2253  BP:  (!) 152/48    Pulse:  92  95  Resp:  19  16  Temp:  (!) 97.2 F (36.2 C)    TempSrc:  Oral    SpO2: 100% 100%  100%  Weight:   78 kg   Height:   5' 1 (1.549 m)    General: Awake and alert and oriented x3. Not in any acute distress.  HEENT: NCAT.  PERRLA. EOMI. Sclerae anicteric.  Moist mucosal membranes. Neck: Neck supple without lymphadenopathy. No carotid bruits. No masses palpated.  Cardiovascular: Regular rate with normal S1-S2 sounds. No murmurs, rubs or gallops auscultated. No JVD.  Respiratory: Diffuse expiratory wheezes. No accessory muscle use. Abdomen: Soft, nontender, nondistended. Active bowel sounds. No masses or hepatosplenomegaly  Skin: No rashes, lesions, or ulcerations.  Dry, warm to touch. Musculoskeletal:  2+ dorsalis pedis and radial pulses. Good ROM.  No contractures  Psychiatric: Intact judgment and insight.  Mood appropriate to current condition. Neurologic: No focal  neurological deficits. Strength is 5/5 x 4.  CN II - XII grossly intact.   Assessment and Plan: Acute on chronic respiratory failure with hypoxia and hypercapnia Acute exacerbation of COPD Continue duo nebs, Mucinex , Solu-Medrol . Continue Protonix  to prevent steroid-induced ulcer Continue incentive spirometry and flutter valve Continue BiPAP with plan to wean patient off this and transition to supplemental oxygen  to maintain O2 sat > 92% as tolerated  Leukocytosis possibly reactive Patient recently obtained steroid in the ED and was prescribed with prednisone  No suspicion for any infectious process at this time  GERD Continue Protonix    History of CAD s/p cardiac stent Continue aspirin , Lipitor , Bystolic  and Brilinta    Chronic diastolic heart failure Recent echo EF 65 to 70% 11/30 Continue beta-blocker.   Type 2 diabetes mellitus with hyperglycemia A1c on 06/12/2024 was 6.5 Continue ISS and hypoglycemia protocol Continue to hold metformin    Anxiety Continue BuSpar .   Diabetic polyneuropathy Continue gabapentin    Acquired hypothyroidism Continue Synthroid   Mixed hyperlipidemia Continue Lipitor , Zetia   Tobacco abuse Ongoing cigarette smoking with last cigarette being few hours prior to arrival to the ED She was counseled on smoking cessation Continue nicotine  patch     Advance Care Planning: Full code  Consults: None  Family Communication: None at  bedside  Severity of Illness: The appropriate patient status for this patient is OBSERVATION. Observation status is judged to be reasonable and necessary in order to provide the required intensity of service to ensure the patient's safety. The patient's presenting symptoms, physical exam findings, and initial radiographic and laboratory data in the context of their medical condition is felt to place them at decreased risk for further clinical deterioration. Furthermore, it is anticipated that the patient will be  medically stable for discharge from the hospital within 2 midnights of admission.   Author: Deakon Frix, DO 07/19/2024 12:06 AM  For on call review [email or web address].      [1]  Allergies Allergen Reactions   Avocado Anaphylaxis   Latex Shortness Of Breath and Rash   Codeine  Nausea Only   "

## 2024-07-19 NOTE — Telephone Encounter (Signed)
 Called [employer]. Pt insurance is active and benefits verified through [facility] Co-pay 0.00, DED 257.00/0 met, out of pocket 9250.00/0 met, co-insurance 20%. No pre-authorization required. No visit limit Spoke with Jesus C,08/09/2024@011 :56 REF# [id number]

## 2024-07-19 NOTE — Progress Notes (Signed)
" ° ° °  EXPEDITER LEVEL LOADING ASSESSMENT NOTE  Patient Name: [name] [name]  DOB:30-Aug-1955 Date of Admission: 07/18/2024  Date of Assessment:07/19/2024   -------------------------------------------------------------------------------------------------------------------   Brief clinical summary: Patient admitted with Acute on Chronic Resp Failure. Patient on 4L O2.   Is there Bed Availability at another [facility]? Yes as of 1417.  If yes, what facility: [facility]  Level of Care Needed:  Progressive  Patient agrees to transfer: Yes, per [facility] RN.  ADDENDUM: Patient assigned to bed at [facility].    -------------------------------------------------------------------------------------------------------------------  [facility] RN Expediter, Saamiya Jeppsen Please contact us  directly via secure chat (search for [facility]) or by calling us  at [phone] [facility]).  "

## 2024-07-19 NOTE — ED Notes (Addendum)
 Pt refusing BIPAP stating stating, take this off of me, I can breath just fine. Reinforced the need for BIPAP and also asked orientation questions and pt is A&Ox4.  [place], DO made aware. Patient put on 2 liters nasal cannula and is sating 96%

## 2024-07-19 NOTE — ED Notes (Signed)
 Pt assisted on bedpan. Pt tolerated well.

## 2024-07-19 NOTE — Progress Notes (Signed)
 "  PROGRESS NOTE    [name] [name]  FMW:[id number] DOB: 08/13/1955 DOA: 07/18/2024 PCP: Vicci Barnie [name], MD   Brief Narrative: [name] [name] is a 69 y.o. female with a history of hyperlipidemia, hypertension, diabetes mellitus type 2, diabetic polyneuropathy, prolonged QTc, anxiety, depression, GERD, hypothyroidism, COPD, chronic respiratory failure on 4 L/min (uses 3-5 per patient report), tobacco use.  Patient presented secondary to respiratory distress and found to have a COPD exacerbation with associated acute respiratory failure. She was started on treatment for her COPD exacerbation with steroids and albuterol , in addition to requiring BiPAP for hypoxia and hypercarbia.   Assessment/Plan:  COPD exacerbation Unclear precipitant, although patient reports some recent respiratory illness. No sick contacts. Afebrile. Patient started on Solu-medrol  and Duoneb. - Continue Solu-medrol  - Continue Duoneb - Add doxycycline   Acute on chronic respiratory failure with hypoxia Acute respiratory failure with hypercapnia Present on admission and secondary to COPD exacerbation. Requiring BiPAP initially and is back to home supplemental oxygen . - Continue supplemental oxygen   Leukocytosis Likely reactive. Worsened with steroids. No evidence of infection.  GERD - Continue Protonix   CAD No chest pain symptoms - Continue bisoprolol, Lipitor , Zetia , aspirin  and Brilinta   Chronic HFpEF Stable. No evidence of acute heart failure at this time.  Diabetes mellitus type 2 Well controlled based on hemoglobin A1C of 6.5%.   Anxiety - Continue BuSpar   Diabetic polyneuropathy - Continue gabapentin   Acquired hypothyroidism - Continue levothyroxine  112 mcg  Hyperlipidemia - Continue Lipitor   Tobacco use Ongoing. Patient counseled on smoking cessation. She is on Chantix  as an outpatient. Nicotine  added for while inpatient. - Continue nicotine  patch   DVT prophylaxis: Lovenox  Code Status:    Code Status: Full Code Family Communication: None at bedside Disposition Plan: Discharge home likely in 1-2 days pending improved/stable respiratory status   Consultants:  None  Procedures:  None  Antimicrobials: None    Subjective: Patient reports she still feels bad. She cannot elaborate further. She reports she is doing better than on admission and she is breathing better than on admission.  Objective: BP (!) 129/58   Pulse 84   Temp 98.4 F (36.9 C) (Oral)   Resp 15   Ht 5' 1 (1.549 m)   Wt 78 kg   SpO2 100%   BMI 32.50 kg/m   Examination:  General exam: Appears calm and comfortable. Respiratory system: Diminished with wheezing. Respiratory effort normal. Cardiovascular system: S1 & S2 heard, RRR. Gastrointestinal system: Abdomen is nondistended, soft and nontender. Normal bowel sounds heard. Central nervous system: Alert and oriented. No focal neurological deficits. Psychiatry: Judgement and insight appear normal. Mood & affect appropriate.    Data Reviewed: I have personally reviewed following labs and imaging studies   Last CBC Lab Results  Component Value Date   WBC 18.9 (H) 07/19/2024   HGB 10.5 (L) 07/19/2024   HCT 34.7 (L) 07/19/2024   MCV 94.6 07/19/2024   MCH 28.6 07/19/2024   RDW 16.3 (H) 07/19/2024   PLT 407 (H) 07/19/2024     Last metabolic panel Lab Results  Component Value Date   GLUCOSE 266 (H) 07/19/2024   NA 139 07/19/2024   K 3.9 07/19/2024   CL 99 07/19/2024   CO2 30 07/19/2024   BUN 25 (H) 07/19/2024   CREATININE 0.78 07/19/2024   GFRNONAA >60 07/19/2024   CALCIUM  9.4 07/19/2024   PHOS 2.9 07/19/2024   PROT 6.5 07/19/2024   ALBUMIN 4.0 07/19/2024   LABGLOB 2.2 06/26/2023  AGRATIO 2.0 09/14/2021   BILITOT <0.2 07/19/2024   ALKPHOS 83 07/19/2024   AST 18 07/19/2024   ALT 29 07/19/2024   ANIONGAP 10 07/19/2024     Creatinine Clearance: Estimated Creatinine Clearance: 63.6 mL/min (by C-G formula based on SCr of  0.78 mg/dL).  Recent Results (from the past 240 hours)  Resp panel by RT-PCR (RSV, Flu A&B, Covid) Throat     Status: None   Collection Time: 07/16/24  5:54 PM   Specimen: Throat; Nasal Swab  Result Value Ref Range Status   SARS Coronavirus 2 by RT PCR NEGATIVE NEGATIVE Final    Comment: (NOTE) SARS-CoV-2 target nucleic acids are NOT DETECTED.  The SARS-CoV-2 RNA is generally detectable in upper respiratory specimens during the acute phase of infection. The lowest concentration of SARS-CoV-2 viral copies this assay can detect is 138 copies/mL. A negative result does not preclude SARS-Cov-2 infection and should not be used as the sole basis for treatment or other patient management decisions. A negative result may occur with  improper specimen collection/handling, submission of specimen other than nasopharyngeal swab, presence of viral mutation(s) within the areas targeted by this assay, and inadequate number of viral copies(<138 copies/mL). A negative result must be combined with clinical observations, patient history, and epidemiological information. The expected result is Negative.  Fact Sheet for Patients:  bloggercourse.com  Fact Sheet for Healthcare Providers:  seriousbroker.it  This test is no t yet approved or cleared by the [place]  FDA and  has been authorized for detection and/or diagnosis of SARS-CoV-2 by FDA under an Emergency Use Authorization (EUA). This EUA will remain  in effect (meaning this test can be used) for the duration of the COVID-19 declaration under Section 564(b)(1) of the Act, 21 U.S.C.section 360bbb-3(b)(1), unless the authorization is terminated  or revoked sooner.       Influenza A by PCR NEGATIVE NEGATIVE Final   Influenza B by PCR NEGATIVE NEGATIVE Final    Comment: (NOTE) The Xpert Xpress SARS-CoV-2/FLU/RSV plus assay is intended as an aid in the diagnosis of influenza from  Nasopharyngeal swab specimens and should not be used as a sole basis for treatment. Nasal washings and aspirates are unacceptable for Xpert Xpress SARS-CoV-2/FLU/RSV testing.  Fact Sheet for Patients: bloggercourse.com  Fact Sheet for Healthcare Providers: seriousbroker.it  This test is not yet approved or cleared by the [place]  FDA and has been authorized for detection and/or diagnosis of SARS-CoV-2 by FDA under an Emergency Use Authorization (EUA). This EUA will remain in effect (meaning this test can be used) for the duration of the COVID-19 declaration under Section 564(b)(1) of the Act, 21 U.S.C. section 360bbb-3(b)(1), unless the authorization is terminated or revoked.     Resp Syncytial Virus by PCR NEGATIVE NEGATIVE Final    Comment: (NOTE) Fact Sheet for Patients: bloggercourse.com  Fact Sheet for Healthcare Providers: seriousbroker.it  This test is not yet approved or cleared by the [place]  FDA and has been authorized for detection and/or diagnosis of SARS-CoV-2 by FDA under an Emergency Use Authorization (EUA). This EUA will remain in effect (meaning this test can be used) for the duration of the COVID-19 declaration under Section 564(b)(1) of the Act, 21 U.S.C. section 360bbb-3(b)(1), unless the authorization is terminated or revoked.  Performed at [facility], [address], [place], [place] [address]   Group A Strep by PCR     Status: None   Collection Time: 07/16/24  5:54 PM   Specimen: Throat; Sterile Swab  Result Value Ref Range Status   Group A Strep by PCR NOT DETECTED NOT DETECTED Final    Comment: Performed at [facility], [address], [place], [place] [address]  Resp panel by RT-PCR (RSV, Flu A&B, Covid) Anterior Nasal Swab     Status: None   Collection Time: 07/18/24 10:37 PM   Specimen:  Anterior Nasal Swab  Result Value Ref Range Status   SARS Coronavirus 2 by RT PCR NEGATIVE NEGATIVE Final   Influenza A by PCR NEGATIVE NEGATIVE Final   Influenza B by PCR NEGATIVE NEGATIVE Final    Comment: (NOTE) The Xpert Xpress SARS-CoV-2/FLU/RSV plus assay is intended as an aid in the diagnosis of influenza from Nasopharyngeal swab specimens and should not be used as a sole basis for treatment. Nasal washings and aspirates are unacceptable for Xpert Xpress SARS-CoV-2/FLU/RSV testing.  Fact Sheet for Patients: bloggercourse.com  Fact Sheet for Healthcare Providers: seriousbroker.it  This test is not yet approved or cleared by the [place]  FDA and has been authorized for detection and/or diagnosis of SARS-CoV-2 by FDA under an Emergency Use Authorization (EUA). This EUA will remain in effect (meaning this test can be used) for the duration of the COVID-19 declaration under Section 564(b)(1) of the Act, 21 U.S.C. section 360bbb-3(b)(1), unless the authorization is terminated or revoked.     Resp Syncytial Virus by PCR NEGATIVE NEGATIVE Final    Comment: (NOTE) Fact Sheet for Patients: bloggercourse.com  Fact Sheet for Healthcare Providers: seriousbroker.it  This test is not yet approved or cleared by the [place]  FDA and has been authorized for detection and/or diagnosis of SARS-CoV-2 by FDA under an Emergency Use Authorization (EUA). This EUA will remain in effect (meaning this test can be used) for the duration of the COVID-19 declaration under Section 564(b)(1) of the Act, 21 U.S.C. section 360bbb-3(b)(1), unless the authorization is terminated or revoked.  Performed at [facility], [address] [place], [place] [address]       Radiology Studies: DG Chest Port 1 View Result Date: 07/18/2024 EXAM: 1 VIEW(S) XRAY OF THE CHEST 07/18/2024 10:53:39 PM  COMPARISON: 07/16/2024 CLINICAL HISTORY: SOB, Cough. FINDINGS: LUNGS AND PLEURA: No focal pulmonary opacity. No pleural effusion. No pneumothorax. HEART AND MEDIASTINUM: No acute abnormality of the cardiac and mediastinal silhouettes. BONES AND SOFT TISSUES: No acute osseous abnormality. IMPRESSION: 1. No active cardiopulmonary disease. Electronically signed by: Franky Crease MD 07/18/2024 10:55 PM EST RP Workstation: [id number]      LOS: 0 days    Elgin Lam, MD [facility] 07/19/2024, 7:56 AM   If 7PM-7AM, please contact night-coverage [email or web address]  "

## 2024-07-19 NOTE — ED Notes (Signed)
 When asked, pt states she wears 3-5 liters 02 at home.

## 2024-07-19 NOTE — ED Notes (Signed)
 Called CCMD and put pt on the monitor

## 2024-07-19 NOTE — ED Notes (Signed)
 Pt ambulated to restroom that was in her room on 4liters 02 @100 % , pt states her chest felt heavy and was jittery requested more oxygen . Pulse / HR 80 100% on 4liters nasal cannula.

## 2024-07-20 ENCOUNTER — Encounter (HOSPITAL_COMMUNITY): Payer: Self-pay | Admitting: Internal Medicine

## 2024-07-20 DIAGNOSIS — E66812 Obesity, class 2: Secondary | ICD-10-CM | POA: Insufficient documentation

## 2024-07-20 LAB — GLUCOSE, CAPILLARY
Glucose-Capillary: 97 mg/dL (ref 70–99)
Glucose-Capillary: 95 mg/dL (ref 70–99)

## 2024-07-20 LAB — CBC
HCT: 38.3 % (ref 36.0–46.0)
Hemoglobin: 11.8 g/dL — ABNORMAL LOW (ref 12.0–15.0)
MCH: 28.9 pg (ref 26.0–34.0)
MCHC: 30.8 g/dL (ref 30.0–36.0)
MCV: 93.6 fL (ref 80.0–100.0)
Platelets: 437 K/uL — ABNORMAL HIGH (ref 150–400)
RBC: 4.09 MIL/uL (ref 3.87–5.11)
RDW: 16.4 % — ABNORMAL HIGH (ref 11.5–15.5)
WBC: 18.2 K/uL — ABNORMAL HIGH (ref 4.0–10.5)
nRBC: 0 % (ref 0.0–0.2)

## 2024-07-20 MED ORDER — PROCHLORPERAZINE MALEATE 10 MG PO TABS
10.0000 mg | ORAL_TABLET | Freq: Four times a day (QID) | ORAL | Status: DC | PRN
  Administered 2024-07-23: 10 mg via ORAL
  Filled 2024-07-20 (×8): qty 1

## 2024-07-20 MED ORDER — ACETAMINOPHEN 500 MG PO TABS
1000.0000 mg | ORAL_TABLET | Freq: Four times a day (QID) | ORAL | Status: DC | PRN
  Administered 2024-07-21 – 2024-07-23 (×2): 1000 mg via ORAL
  Filled 2024-07-20 (×5): qty 2

## 2024-07-20 MED ORDER — ALBUTEROL SULFATE HFA 108 (90 BASE) MCG/ACT IN AERS
2.0000 | INHALATION_SPRAY | RESPIRATORY_TRACT | Status: DC | PRN
  Administered 2024-07-24: 2 via RESPIRATORY_TRACT
  Filled 2024-07-20 (×2): qty 6.7

## 2024-07-20 MED ORDER — BUSPIRONE HCL 10 MG PO TABS
60.0000 mg | ORAL_TABLET | Freq: Every day | ORAL | Status: DC
  Administered 2024-07-21 – 2024-07-25 (×5): 60 mg via ORAL
  Filled 2024-07-20: qty 4
  Filled 2024-07-20 (×2): qty 6
  Filled 2024-07-20 (×2): qty 4
  Filled 2024-07-20: qty 6

## 2024-07-20 MED ORDER — POLYETHYLENE GLYCOL 3350 17 G PO PACK
17.0000 g | PACK | Freq: Once | ORAL | Status: AC | PRN
  Administered 2024-07-21: 17 g via ORAL
  Filled 2024-07-20 (×2): qty 1

## 2024-07-20 MED ORDER — METFORMIN HCL 500 MG PO TABS
500.0000 mg | ORAL_TABLET | Freq: Two times a day (BID) | ORAL | Status: DC
  Administered 2024-07-20 – 2024-07-26 (×12): 500 mg via ORAL
  Filled 2024-07-20 (×14): qty 1

## 2024-07-20 MED ORDER — IPRATROPIUM-ALBUTEROL 0.5-2.5 (3) MG/3ML IN SOLN
3.0000 mL | Freq: Four times a day (QID) | RESPIRATORY_TRACT | Status: DC
  Administered 2024-07-20 – 2024-07-24 (×14): 3 mL via RESPIRATORY_TRACT
  Filled 2024-07-20 (×18): qty 3

## 2024-07-20 MED ORDER — EMPAGLIFLOZIN 10 MG PO TABS
10.0000 mg | ORAL_TABLET | Freq: Every day | ORAL | Status: DC
  Administered 2024-07-21 – 2024-07-26 (×6): 10 mg via ORAL
  Filled 2024-07-20 (×8): qty 1

## 2024-07-20 MED ORDER — BUSPIRONE HCL 15 MG PO TABS
30.0000 mg | ORAL_TABLET | Freq: Once | ORAL | Status: AC
  Administered 2024-07-20: 30 mg via ORAL
  Filled 2024-07-20: qty 2

## 2024-07-20 MED ORDER — GUAIFENESIN-DM 100-10 MG/5ML PO SYRP
15.0000 mL | ORAL_SOLUTION | ORAL | Status: DC | PRN
  Filled 2024-07-20 (×4): qty 15

## 2024-07-20 MED ORDER — CLONAZEPAM 0.5 MG PO TABS: 0.5000 mg | ORAL_TABLET | Freq: Two times a day (BID) | ORAL | 0 refills | Status: DC | PRN

## 2024-07-20 MED ORDER — ESCITALOPRAM OXALATE 20 MG PO TABS
20.0000 mg | ORAL_TABLET | Freq: Every day | ORAL | Status: DC
  Administered 2024-07-20 – 2024-07-25 (×6): 20 mg via ORAL
  Filled 2024-07-20 (×2): qty 1
  Filled 2024-07-20: qty 2
  Filled 2024-07-20 (×7): qty 1
  Filled 2024-07-20: qty 2

## 2024-07-20 MED ORDER — IPRATROPIUM-ALBUTEROL 0.5-2.5 (3) MG/3ML IN SOLN: 3.0000 mL | RESPIRATORY_TRACT | Status: AC

## 2024-07-20 MED ORDER — GUAIFENESIN ER 600 MG PO TB12
1200.0000 mg | ORAL_TABLET | Freq: Two times a day (BID) | ORAL | Status: DC
  Administered 2024-07-20 – 2024-07-26 (×13): 1200 mg via ORAL
  Filled 2024-07-20 (×15): qty 2

## 2024-07-20 MED ORDER — METHYLPREDNISOLONE SODIUM SUCC 40 MG IJ SOLR
40.0000 mg | INTRAMUSCULAR | Status: DC
  Administered 2024-07-21 – 2024-07-25 (×5): 40 mg via INTRAVENOUS
  Filled 2024-07-20 (×6): qty 1

## 2024-07-20 MED ORDER — IPRATROPIUM-ALBUTEROL 0.5-2.5 (3) MG/3ML IN SOLN
3.0000 mL | Freq: Three times a day (TID) | RESPIRATORY_TRACT | Status: DC
  Filled 2024-07-20 (×3): qty 3

## 2024-07-20 MED ORDER — ENSIFENTRINE 3 MG/2.5ML IN SUSP
3.0000 mg | Freq: Two times a day (BID) | RESPIRATORY_TRACT | Status: DC
  Administered 2024-07-20 – 2024-07-23 (×6): 3 mg via RESPIRATORY_TRACT
  Filled 2024-07-20 (×2): qty 1

## 2024-07-20 NOTE — Assessment & Plan Note (Addendum)
 07/23/2024 continue with jardiance . Monitor CBG as needed.restart metformin .

## 2024-07-20 NOTE — Assessment & Plan Note (Addendum)
 Pt advised multiple times during [facility] consenting process that she is not allowed to smoke while under the care of [facility]. Dtr removed all tobacco materials, cigarettes, lighters, etc. From home --Continue with nicotine  patches --Added nicorette  gum PRN

## 2024-07-20 NOTE — Evaluation (Addendum)
 Occupational Therapy Evaluation Patient Details Name: [name] [name] MRN: [id number] DOB: 09-Aug-1955 Today's Date: 07/20/2024   History of Present Illness   69 y.o. F adm 1/4 respiratory distress PMH HTN, CAD with multiple DES, DM2, COPD on 4L O2, anxiety, hypothyroidism, RTC repair 12/24/2021, ongoing tobacco abuse. Recently admitted from 12/4 to 12/9 due to acute on chronic hypoxic respiratory failure due to COPD expiration and acute bronchitis Smoker     Clinical Impressions Pt is at baseline Mod I level with ADLs and ADL mobility, sup with shower transfers. Pt adamantly declines use of gait belt or AD for safety but does reports that she has a shower chair at home and that she does use it. PTA pt lives alone in single story apartment, 3 STE, was Ind-Mod I with ADLs/selfcare, meal prep and home mgt, daughter takes her to appointments, manages finances and medication. Pt demos poor safety awareness, poor insight into deficits and adamantly refuses use of gait belt during session to walk to bathroom. Pt is on home O2, currently maintaining >90% with in room ADL and ADL mobility activity, however does demo DOE. MD and pt planning for [facility] program, HH therapy is recommended.    If plan is discharge home, recommend the following:   Assistance with cooking/housework;Assist for transportation;Help with stairs or ramp for entrance     Functional Status Assessment   Patient has had a recent decline in their functional status and demonstrates the ability to make significant improvements in function in a reasonable and predictable amount of time.     Equipment Recommendations   None recommended by OT     Recommendations for Other Services         Precautions/Restrictions   Precautions Precautions: Fall Recall of Precautions/Restrictions: Impaired Precaution/Restrictions Comments: pt reports recent fall with R ankle pain Restrictions Weight Bearing Restrictions Per Provider Order:  No     Mobility Bed Mobility Overal bed mobility: Modified Independent             General bed mobility comments: HoB elevated and use of bedrail to pull to EoB    Transfers Overall transfer level: Modified independent                 General transfer comment: good power up increased effort to self steady      Balance Overall balance assessment: Mild deficits observed, not formally tested                                         ADL either performed or assessed with clinical judgement   ADL Overall ADL's : Modified independent;At baseline                                       General ADL Comments: sup with shower transfers. Pt educated on energy conservation strategies with handouts provided     Vision Baseline Vision/History: 1 Wears glasses Ability to See in Adequate Light: 0 Adequate Patient Visual Report: No change from baseline       Perception         Praxis         Pertinent Vitals/Pain Pain Assessment Pain Assessment: No/denies pain Pain Score: 0-No pain Pain Intervention(s): Monitored during session     Extremity/Trunk Assessment Upper Extremity Assessment Upper Extremity Assessment:  Overall [facility] for tasks assessed   Lower Extremity Assessment Lower Extremity Assessment: Defer to PT evaluation       Communication Communication Communication: No apparent difficulties   Cognition Arousal: Alert Behavior During Therapy: [facility] for tasks assessed/performed                                 Following commands: Impaired Following commands impaired: Follows one step commands inconsistently, Follows multi-step commands inconsistently     Cueing  General Comments   Cueing Techniques: Verbal cues      Exercises     Shoulder Instructions      Home Living   Living Arrangements: Alone Available Help at Discharge: Family;Available PRN/intermittently Type of Home: Apartment Home Access:  Stairs to enter Entrance Stairs-Number of Steps: 2 Entrance Stairs-Rails: Left Home Layout: One level     Bathroom Shower/Tub: Walk-in shower;Tub/shower unit   Bathroom Toilet: Standard     Home Equipment: [employer] (2 wheels);Shower seat;Rollator (4 wheels);Cane - single point;Crutches          Prior Functioning/Environment Prior Level of Function : Independent/Modified Independent             Mobility Comments: recent fall with R ankle injury, does not use her AD for household distances and limited community ambulation ADLs Comments: Dtr drives her places as needed. daughter takes care of medicine and finances    OT Problem List: Impaired balance (sitting and/or standing);Decreased knowledge of use of DME or AE   OT Treatment/Interventions: Self-care/ADL training;Therapeutic activities;DME and/or AE instruction;Energy conservation      OT Goals(Current goals can be found in the care plan section)   Acute Rehab OT Goals Patient Stated Goal: go home OT Goal Formulation: With patient Time For Goal Achievement: 08/03/24 Potential to Achieve Goals: Good ADL Goals Pt Will Perform Tub/Shower Transfer: with modified independence;Independently;ambulating;shower seat;grab bars Additional ADL Goal #1: Pt will verbalize and demo 3 energy conservation strategies for ADLs and ADL mobility tasks   OT Frequency:  Min 2X/week    Co-evaluation              AM-PAC OT 6 Clicks Daily Activity     Outcome Measure Help from another person eating meals?: None Help from another person taking care of personal grooming?: None Help from another person toileting, which includes using toliet, bedpan, or urinal?: None Help from another person bathing (including washing, rinsing, drying)?: None Help from another person to put on and taking off regular upper body clothing?: None Help from another person to put on and taking off regular lower body clothing?: None 6 Click Score:  24   End of Session Nurse Communication: Mobility status  Activity Tolerance: Patient tolerated treatment well Patient left: in bed;with call bell/phone within reach;with bed alarm set  OT Visit Diagnosis: Muscle weakness (generalized) (M62.81);Unsteadiness on feet (R26.81)                Time: 8971-8946 OT Time Calculation (min): 25 min Charges:  OT General Charges $OT Visit: 1 Visit OT Evaluation $OT Eval Low Complexity: 1 Low OT Treatments $Therapeutic Activity: 8-22 mins   Jacques Karna Loose 07/20/2024, 12:09 PM

## 2024-07-20 NOTE — Progress Notes (Signed)
 Pt was transported home for admission into program, once at home pt had to walk to front door due to no access for wheelchair. Pt was very SOB and had to have oxygen  increased to 6LPM. Once pt was in a seated position and calmed down her oxygen  was turned back down to pt's normal. Pt skin assessment was completed with myself and vanessa which was unremarkable. Pt meds were organized and clonipine was left with pt. Verified with vanessa on quantity in bottle.  pt was educated on program and how things work. Pt is very understandable and comprehended all info. Pt was left in care of daughter.

## 2024-07-20 NOTE — Hospital Course (Addendum)
 CC: SOB HPI: [name] [name] is a 69 y.o. female with medical history significant of hypertension, hyperlipidemia, T2DM, diabetic polyneuropathy, prolonged QTc, anxiety, depression, GERD, hypothyroidism, COPD, chronic hypoxic respiratory failure on 4 LPM of oxygen , chronic tobacco abuse who presents emergency department due to respiratory distress.  [facility] on 07/16/2024 for same symptoms.  COVID and flu test done with negative and she was treated at the ED and discharged home with prednisone .  She has only taken 1 dose since that time. Shortness of breath worsened while going to the bathroom at home today and EMS was activated.  On arrival of EMS team, breathing treatment with albuterol  and Atrovent  were provided.  Solu-Medrol  125 mg x 1, magnesium  and epi were given. Patient was recently admitted from 12/4 to 12/9 due to acute on chronic hypoxic respiratory failure due to COPD expiration and acute bronchitis and was treated with IV steroids, azithromycin , nebulizer treatments.     ED course In the emergency department, temperature was 37.2 F, BP 152/48, O2 sat was 100% on BiPAP with FiO2 50%.  Workup in the ED showed normocytic anemia and leukocytosis. Chest x-ray showed streaky atelectasis or scarring at the left base. TRH was asked to admit patient.  Significant Events: Admitted 07/18/2024 for COPD exacerbation, acute on chronic hypoxic and hypercapnic respiratory failure 07-19-2024 RVP POSITIVE for Rhino/Enterovirus 07-20-2024 pt consented and transferred to [facility] program 07-23-2024 due to pt's increased O2 need, profound weakness & unsafe functional status (legally blind and unable to take meds without in-person assistance), patient was transferred back to [facility] hospital today.  Admission Labs: VBG pH 7.40, PCO2 of 56 Lactic acid 1.5 WBC 15.7, HgB 11.3 Plt 393 proBNP 214 Na 139, K 3.6, CO2 of 34, BUN 26, Scr 0.85, glu 216 Covid/Flu/RSV negative Mg  2.3  Admission Imaging Studies: CXR No active cardiopulmonary disease.   Significant Labs: Mg 1.6 (07/21/24) CO2 34 >> 30 (07/21/24) proBNP 349.0 (07/21/24) WBC trend 18.2 >> 13.4 >> 13.7 >> 14.3 today Hbg trend 11.8 >> 10.6 >> 12.4 >> 11.6today  Significant Imaging Studies:

## 2024-07-20 NOTE — Progress Notes (Signed)
 Patient transferred to the [facility] program. Communicated with patient via video tablet. AAOx4. Plan of care reviewed with patient and daughter. Patient was told that the virtual nurse is available 24/7. [facility] phone number given to patient. Tablet usage explained. Medication reconciliation and skin check completed with Lorenza, paramedic.

## 2024-07-20 NOTE — Assessment & Plan Note (Addendum)
 07/23/2024 continue lexapro  and buspar 

## 2024-07-20 NOTE — Assessment & Plan Note (Addendum)
07/23/2024

## 2024-07-20 NOTE — Evaluation (Signed)
 Physical Therapy Evaluation Patient Details Name: [name] [name] MRN: [id number] DOB: 04/28/1956 Today's Date: 07/20/2024  History of Present Illness  69 y.o. F adm 1/4 respiratory distress PMH HTN, CAD with multiple DES, DM2, COPD on 4L O2, anxiety, hypothyroidism, RTC repair 12/24/2021, ongoing tobacco abuse. Recently admitted from 12/4 to 12/9 due to acute on chronic hypoxic respiratory failure due to COPD expiration and acute bronchitis Smoker  Clinical Impression  PTA pt living alone in single story apartment with 3 steps to enter. Pt report independence with household and limited community ambulation without AD. Pt is independent with ADLs, daughter manages finances and medication and drives patient to appointments. Pt is vague but reports fall and R ankle pain. Reports she missed appointment for ankle because she came to the hospital with SoB. Pt has poor safety awareness and adamantly opposes use of gait belt during session. Pt is mod I for bed mobility and transfers and supervision for ambulation to bathroom and back refusing AD. Pt noted to have decreased weightshift to RLE, creating mildly unsteady gait. Pt is able to maintain SpO2 >90%O2 at rest and with ambulation to bathroom and back to recliner, however pt exhibits 3/4 DoE and productive coughing fit upon sitting in recliner. PT recommending HHPT or pulmonary rehab if available at discharge. PT will continue to follow acutely and will refer to Mobility Specialist.       If plan is discharge home, recommend the following: HHPT (or pulmonary rehab)   Can travel by private vehicle    Yes    Equipment Recommendations None recommended by PT     Functional Status Assessment Patient has had a recent decline in their functional status and demonstrates the ability to make significant improvements in function in a reasonable and predictable amount of time.     Precautions / Restrictions Precautions Precautions: Fall Recall of  Precautions/Restrictions: Impaired Precaution/Restrictions Comments: pt reports recent fall with R ankle pain Restrictions Weight Bearing Restrictions Per Provider Order: No      Mobility  Bed Mobility Overal bed mobility: Modified Independent             General bed mobility comments: HoB elevated and use of bedrail to pull to EoB    Transfers Overall transfer level: Modified independent Equipment used: None               General transfer comment: good power up increased effort to self steady    Ambulation/Gait Ambulation/Gait assistance: Supervision Gait Distance (Feet): 18 Feet (+22) Assistive device: None Gait Pattern/deviations: Step-through pattern, Decreased weight shift to right, Shuffle, Trunk flexed Gait velocity: to fast for conditions Gait velocity interpretation: <1.8 ft/sec, indicate of risk for recurrent falls   General Gait Details: pt refuses CGA with supervision, pt ambulates at increased pace to bathroom and from bathroom to recliner     Balance Overall balance assessment: Mild deficits observed, not formally tested                                           Pertinent Vitals/Pain Pain Assessment Pain Assessment: 0-10 Pain Score: 8  Pain Location: chest and throat Pain Descriptors / Indicators: Tightness, Sore Pain Intervention(s): Limited activity within patient's tolerance, Monitored during session, Premedicated before session    Home Living Family/patient expects to be discharged to:: Private residence Living Arrangements: Alone Available Help at Discharge: Family;Available PRN/intermittently Type of  Home: Apartment Home Access: Stairs to enter Entrance Stairs-Rails: Left Entrance Stairs-Number of Steps: 2   Home Layout: One level Home Equipment: Agricultural Consultant (2 wheels);Shower seat;Rollator (4 wheels);Cane - single point;Crutches (lift chair coming soon)      Prior Function Prior Level of Function :  Independent/Modified Independent             Mobility Comments: recent fall with R ankle injury, does not use her AD for household distances and limited community ambulation ADLs Comments: Dtr drives her places as needed. daughter takes care of medicine and finances     Extremity/Trunk Assessment   Upper Extremity Assessment Upper Extremity Assessment: Defer to OT evaluation    Lower Extremity Assessment Lower Extremity Assessment: RLE deficits/detail RLE Deficits / Details: reports R ankle pain from fall, does not allow for formal exam, decreased weight bearing with ambulation    Cervical / Trunk Assessment Cervical / Trunk Assessment: Kyphotic  Communication   Communication Communication: No apparent difficulties    Cognition Arousal: Alert Behavior During Therapy: WFL for tasks assessed/performed   PT - Cognitive impairments: Safety/Judgement, Awareness                       PT - Cognition Comments: pt becomes very upset when PT goes to put gait belt around her, refusing and raising voice with therapist stating it is very demeaning I can walk just fine Following commands: Impaired Following commands impaired: Follows one step commands inconsistently, Follows multi-step commands inconsistently (does not follow commands she feels are demeaning like wearing gait belt or walking with AD)     Cueing Cueing Techniques: Verbal cues, Gestural cues     General Comments General comments (skin integrity, edema, etc.): Pt maintains SpO2 >90%O2 on 4L O2 via [place] at rest and with limited ambulation in room        Assessment/Plan    PT Assessment Patient needs continued PT services  PT Problem List Decreased activity tolerance;Decreased balance;Decreased safety awareness;Decreased knowledge of precautions;Cardiopulmonary status limiting activity       PT Treatment Interventions DME instruction;Gait training;Stair training;Functional mobility training;Therapeutic  activities;Therapeutic exercise;Balance training;Cognitive remediation;Patient/family education    PT Goals (Current goals can be found in the Care Plan section)  Acute Rehab PT Goals Patient Stated Goal: go home PT Goal Formulation: With patient Time For Goal Achievement: 08/03/24 Potential to Achieve Goals: Fair    Frequency Min 2X/week        AM-PAC PT 6 Clicks Mobility  Outcome Measure Help needed turning from your back to your side while in a flat bed without using bedrails?: None Help needed moving from lying on your back to sitting on the side of a flat bed without using bedrails?: None Help needed moving to and from a bed to a chair (including a wheelchair)?: A Little Help needed standing up from a chair using your arms (e.g., wheelchair or bedside chair)?: A Little Help needed to walk in hospital room?: A Little Help needed climbing 3-5 steps with a railing? : A Lot 6 Click Score: 19    End of Session Equipment Utilized During Treatment: Oxygen  Activity Tolerance: Treatment limited secondary to medical complications (Comment) (SoB and productive coughing) Patient left: in chair;with call bell/phone within reach;with chair alarm set Nurse Communication: Mobility status PT Visit Diagnosis: Unsteadiness on feet (R26.81);Muscle weakness (generalized) (M62.81);Pain Pain - Right/Left: Right Pain - part of body: Ankle and joints of foot    Time: 9160-9096 PT Time Calculation (  min) (ACUTE ONLY): 24 min   Charges:   PT Evaluation $PT Eval Low Complexity: 1 Low PT Treatments $Therapeutic Activity: 8-22 mins PT General Charges $$ ACUTE PT VISIT: 1 Visit         Grantham Hippert B. Fleeta Lapidus PT, DPT Acute Rehabilitation Services Please use secure chat or  Call Office [phone]   Almarie [name] [facility] 07/20/2024, 9:18 AM

## 2024-07-20 NOTE — Assessment & Plan Note (Addendum)
 07/23/2024  --continue supportive care.  --On po doxycyline to prevent bacterial infection of her severe COPD.

## 2024-07-20 NOTE — Assessment & Plan Note (Addendum)
 07/23/2024 continue synthroid  112 mcg daily.

## 2024-07-20 NOTE — Progress Notes (Signed)
 This clinical research associate went to [employer], spoke with the diplomatic services operational officer about the patient in room 9, obtained patients shadow chart and expressed she was being admitted on the [facility] program and not to discharge patient out of the system. This writer went to patients room, found patient lying in bed, still in hospital gown. The team Paramedic, Braulio helped the patient get her clothes on to be transported. Paramedic Natosha obtained vital signs for the patient while this writer went to get wheelchair. This clinical research associate assisted patient with standing and pivoted and sat into wheelchair. Patient is on 4.5 LPM of O2, this writer switched patient over to portable tank at 4.5 LPM. Patient was pushed to the transport van, patient was loaded, secured via 2 front floor hooks, 2 rear floor hooks, 2 wheelchair locks and lap/shoulder seatbelt. Patient was transported home safely. Patient walked into her apartment, very short of breath, patient sat in her recliner, O2 was bumped up to 6 LPM. Patients O2 stats read 98% on 6 LPM. This clinical research associate set up [facility] equipment, while patient sat and rested. This clinical research associate called the Acupuncturist, Shanda and made her aware that her O2 level was bumped up and her stat readings, patient was brought back down to 4 LPM and stated at 98%. Patient was educated on how to call the Virtual Nurse if she needed anything, patient was able to do this with no problem. The Virtual Nurse, Shanda spoke with the patient. Patients vital signs were obtained and uploaded into EPIC under vital sign tab. This clinical research associate reiterated with the patient on NO smoking while she was on the program and especially while she had a nicotine  patch on and O2 in her nose. Patient stated she understood. The patients daughter gave this clinical research associate the code for patients house to get into her house. This was passed onto the [facility] Team for future visits. This clinical research associate left patients house. ---------------------------------------- Nat SQUIBB. Franchot, EMT

## 2024-07-20 NOTE — Assessment & Plan Note (Addendum)
 07/23/2024 pt was DNR/DNI prior to admission. Pt changed herself back to FULL CODE. Dtr was to talk to outpatient palliative care about GOC and code status. Dtr thinks pt should be DNR.

## 2024-07-20 NOTE — Assessment & Plan Note (Addendum)
 07/23/2024 continue klonopin  0.5 mg bid prn. anxiety

## 2024-07-20 NOTE — Assessment & Plan Note (Addendum)
 07/23/2024 - BP 169/76 this AM, later improved 116/85. --Continue Bystolic   --resume Demadex  tomorrow AM

## 2024-07-20 NOTE — Subjective & Objective (Signed)
 Pt seen and examined.  Discussed hospital at home program. Discussed benefits and participation requirements. Specifically discussed that pt CANNOT smoke while under the care of [facility]. Repeated this multiple times to the patient. Pt ultimately said she would able to comply with requirement that she cannot smoke. She understands that if she does smoke while under care of [facility], that would be grounds to transport her back to hospital so she could finish out her inpatient care. Discussed this also with pt's dtr Doran Munroe [phone].  Pt has been assessed by PT/OT. She will need home health PT/OT at discharge.  Also discussed with pt's dtr Tiffiney about advanced directives. Pt has signed DNR form in her chart. But she was made a FULL code on admission. Dtr requesting outpatient palliative care appointment so that advanced directives can be reviewed again when pt is not acutely ill.  Pt consented to participate with [facility].  Pt's attending notified of transfer.

## 2024-07-20 NOTE — Progress Notes (Signed)
 This clinical research associate went to room [employer], room 9, found the patient sitting up in bed. Patient is awake and A&O X4. This clinical research associate verified patients address, phone numbers, emergency contacts and went over the [facility] program and answered questions. Patient states she wears O2 at home consistently at 3 LPM. This writer explained we would bring out a concentrator and nebulizer machine while on the program, patient got upset and said she did not want that stuff in her house, she doesn't have room. This clinical research associate reiterated with the patient it was part of the program and when she discharges, we will take it back. She was fine with this. Patient declined meals. While in the patients room, Dr. Laurence came in and spoke to the patients nurse and asked for her to receive a nebulizer treatment, Nurse complied. ----------------------------------------------------------------------------------------------------------- Nat SQUIBB. Franchot, EMT

## 2024-07-20 NOTE — Assessment & Plan Note (Addendum)
 07/23/2024 chronically on 4 L/min O2 at home, per daughter turns up to 5-6 L/min with activity.  Acutely exacerbated by viral infection and COPD exacerbation. Keep O2 sats >= 90%

## 2024-07-20 NOTE — Assessment & Plan Note (Addendum)
 07/23/2024 continue with zetia  and lipitor .

## 2024-07-20 NOTE — Progress Notes (Signed)
 " PROGRESS NOTE    [name] [name]  FMW:[id number] DOB: 08/30/1955 DOA: 07/18/2024 PCP: Vicci Barnie [name], MD  Subjective: Pt seen and examined.  Discussed hospital at home program. Discussed benefits and participation requirements. Specifically discussed that pt CANNOT smoke while under the care of Hospital at Home. Repeated this multiple times to the patient. Pt ultimately said she would able to comply with requirement that she cannot smoke. She understands that if she does smoke while under care of Hospital at Home, that would be grounds to transport her back to hospital so she could finish out her inpatient care. Discussed this also with pt's dtr Doran Munroe [phone].  Pt has been assessed by PT/OT. She will need home health PT/OT at discharge.  Also discussed with pt's dtr Tiffiney about advanced directives. Pt has signed DNR form in her chart. But she was made a FULL code on admission. Dtr requesting outpatient palliative care appointment so that advanced directives can be reviewed again when pt is not acutely ill.  Pt consented to participate with [facility].  Pt's attending notified of transfer.   Hospital Course: CC: SOB HPI: [name] [name] is a 68 y.o. female with medical history significant of hypertension, hyperlipidemia, T2DM, diabetic polyneuropathy, prolonged QTc, anxiety, depression, GERD, hypothyroidism, COPD, chronic hypoxic respiratory failure on 4 LPM of oxygen , chronic tobacco abuse who presents emergency department due to respiratory distress.  She was seen at [facility] on 07/16/2024 for same symptoms.  COVID and flu test done with negative and she was treated at the ED and discharged home with prednisone .  She has only taken 1 dose since that time. Shortness of breath worsened while going to the bathroom at home today and EMS was activated.  On arrival of EMS team, breathing treatment with albuterol  and Atrovent  were provided.  Solu-Medrol  125 mg x 1, magnesium  and  epi were given. Patient was recently admitted from 12/4 to 12/9 due to acute on chronic hypoxic respiratory failure due to COPD expiration and acute bronchitis and was treated with IV steroids, azithromycin , nebulizer treatments.     ED course In the emergency department, temperature was 37.2 F, BP 152/48, O2 sat was 100% on BiPAP with FiO2 50%.  Workup in the ED showed normocytic anemia and leukocytosis. Chest x-ray showed streaky atelectasis or scarring at the left base. TRH was asked to admit patient.  Significant Events: Admitted 07/18/2024 for COPD exacerbation, acute on chronic hypoxic and hypercapnic respiratory failure 07-19-2024 RVP POSITIVE for Rhino/Enterovirus 07-21-2023 pt consented and transferred to [facility] program  Admission Labs: VBG pH 7.40, PCO2 of 56 Lactic acid 1.5 WBC 15.7, HgB 11.3 Plt 393 proBNP 214 Na 139, K 3.6, CO2 of 34, BUN 26, Scr 0.85, glu 216 Covid/Flu/RSV negative Mg 2.3  Admission Imaging Studies: CXR No active cardiopulmonary disease.   Significant Labs:   Significant Imaging Studies:   Antibiotic Therapy: Anti-infectives (From admission, onward)    Start     Dose/Rate Route Frequency Ordered Stop   07/19/24 1545  doxycycline  (VIBRA -TABS) tablet 100 mg        100 mg Oral Every 12 hours 07/19/24 1531         Procedures:   Consultants:     Assessment and Plan: * Acute on chronic respiratory failure with hypoxia and hypercapnia (HCC) 07/20/2024 due to COPE exacerbation from Rhinovirus infection. Pt not requiring Bipiap. Chronic O2 requirement of 2-3L/min at home. Continue with home O2. Titrate to keep O2 sats <=  90%   Chronic hypoxic respiratory failure (HCC) 07/20/2024 chronically on 2-3 L/min O2 at home. Acutely exacerbated by viral infection and COPD exacerbation. Keep O2 sats >= 90%   COPD with acute exacerbation (HCC) 07/20/2024 continue with TID duonebs. Q6h prn nebs. Continue with IV solumedrol 40 mg daily. Given viral  infection on top of her severe COPD, she may take a few more days of IV steroids to calm her acute inflammatory process down.   Rhinovirus infection 07/20/2024 continue supportive care. On po doxycyline to prevent bacterial infection of her severe COPD.   TOBACCO ABUSE 07/20/2024 pt advised multiple times during [facility] consenting process that she is not allowed to smoke while under the care of Hospital at Home. Dtr [name] will go to pt's home today and get rid of all tobacco materials, cigarettes, lighters, etc. Continue with nicotine  patches.   (HFpEF) heart failure with preserved ejection fraction (HCC) 07/20/2024 chronic. Not exacerbated. Demadex  on hold due to elevated serum CO2. This could be from contraction alkalosis vs hypercapnia. Repeat CMP and BNP in AM to help guide whether to restart diuretics or not.   Advanced directives, counseling/discussion 07/20/2024 pt was DNR/DNI prior to admission. Pt changed herself back to FULL CODE. Dtr was to talk to outpatient palliative care about GOC and code status. Dtr thinks pt should be DNR.     Non-insulin  dependent type 2 diabetes mellitus (HCC) 07/20/2024 continue with jardiance . Monitor CBG as needed.restart metformin .   Long-term current use of benzodiazepine 07/20/2024 continue klonopin  0.5 mg bid prn. anxiety   CKD (chronic kidney disease) stage 2, GFR 60-89 ml/min 07/20/2024 stable. Home demadex  dose on hold at now.   Major depressive disorder, recurrent, in full remission with anxious distress 07/20/2024   GAD (generalized anxiety disorder) 07/20/2024 continue with buspar  and prn klonopin .   Hyperlipidemia 07/20/2024 continue with zetia  and lipitor .   Essential hypertension 07/20/2024 pt on bystolic  for HTN. Not volume overloaded at this time. Home dose of demadex  currently on hold.   Chronic depression 07/20/2024 continue lexapro  and buspar    Acquired hypothyroidism 07/20/2024 continue synthroid  112 mcg  daily.   Obesity, Class II, BMI 35-39.9 07/20/2024 Body mass index is 32.5 kg/m.    DVT prophylaxis: SCDs Start: 07/19/24 0200    Code Status: Full Code Family Communication: discussed via phone with pt's dtr [name] Munroe Disposition Plan: home Reason for continuing need for hospitalization: remains on higher than home dose of supplemental O2(4 L/min), tid nebs and IV solumedrol. Will transfer to hospital at home today.  Objective: Vitals:   07/20/24 0717 07/20/24 0719 07/20/24 0838 07/20/24 1157  BP:   129/71 (!) 161/80  Pulse: 74  75 72  Resp: 20  17 19   Temp:   98.2 F (36.8 C) 98.1 F (36.7 C)  TempSrc:   Oral Oral  SpO2: 100% 100%  100%  Weight:      Height:        Intake/Output Summary (Last 24 hours) at 07/20/2024 1226 Last data filed at 07/19/2024 2000 Gross per 24 hour  Intake 240 ml  Output --  Net 240 ml   Filed Weights   07/18/24 2243  Weight: 78 kg    Examination:  Physical Exam Vitals and nursing note reviewed.  Constitutional:      General: She is not in acute distress.    Appearance: She is obese. She is not toxic-appearing.  HENT:     Head: Normocephalic and atraumatic.  Eyes:  General: No scleral icterus. Cardiovascular:     Rate and Rhythm: Normal rate and regular rhythm.  Pulmonary:     Effort: Pulmonary effort is normal. No respiratory distress.     Breath sounds: Wheezing present.     Comments: Diffuse wheezing. No distress Abdominal:     General: Bowel sounds are normal. There is no distension.     Palpations: Abdomen is soft.     Tenderness: There is no abdominal tenderness.  Musculoskeletal:     Right lower leg: No edema.     Left lower leg: No edema.  Skin:    General: Skin is warm and dry.     Capillary Refill: Capillary refill takes less than 2 seconds.  Neurological:     General: No focal deficit present.     Mental Status: She is alert and oriented to person, place, and time.     Data Reviewed: I have personally  reviewed following labs and imaging studies  CBC: Recent Labs  Lab 07/16/24 1754 07/18/24 2239 07/18/24 2240 07/18/24 2241 07/18/24 2244 07/19/24 0339 07/20/24 0812  WBC 13.6*  --  15.7*  --   --  18.9* 18.2*  NEUTROABS 10.3*  --  9.3*  --   --   --   --   HGB 11.3*   < > 11.3* 11.6* 12.6 10.5* 11.8*  HCT 36.7   < > 37.5 34.0* 37.0 34.7* 38.3  MCV 92.7  --  95.7  --   --  94.6 93.6  PLT 376  --  393  --   --  407* 437*   < > = values in this interval not displayed.   Basic Metabolic Panel: Recent Labs  Lab 07/16/24 1754 07/18/24 2239 07/18/24 2240 07/18/24 2241 07/18/24 2244 07/19/24 0339  NA 140 137 139 137 136 139  K 3.9 3.4* 3.6 3.4* 3.6 3.9  CL 99  --  96*  --  98 99  CO2 31  --  34*  --   --  30  GLUCOSE 151*  --  216*  --  196* 266*  BUN 13  --  26*  --  31* 25*  CREATININE 0.83  --  0.85  --  1.00 0.78  CALCIUM  9.5  --  9.8  --   --  9.4  MG  --   --   --   --   --  2.3  PHOS  --   --   --   --   --  2.9   GFR: Estimated Creatinine Clearance: 63.6 mL/min (by C-G formula based on SCr of 0.78 mg/dL). Liver Function Tests: Recent Labs  Lab 07/16/24 1754 07/19/24 0339  AST 23 18  ALT 32 29  ALKPHOS 96 83  BILITOT 0.3 <0.2  PROT 6.9 6.5  ALBUMIN 4.1 4.0   ProBNP, BNP (last 5 results) Recent Labs    11/25/23 1947 12/04/23 0045 12/05/23 0547 06/12/24 1528 06/18/24 0039 07/13/24 1642 07/16/24 1754 07/18/24 2240  PROBNP  --   --   --   --   --  462* 67.6 214.0  BNP 84.1 106.8* 72.9 81.1 103.4*  --   --   --    CBG: Recent Labs  Lab 07/19/24 1144 07/19/24 1631 07/19/24 2103 07/20/24 0624 07/20/24 1156  GLUCAP 153* 148* 120* 97 95   Sepsis Labs: Recent Labs  Lab 07/18/24 2240  LATICACIDVEN 1.5    Recent Results (from the past 240 hours)  Resp  panel by RT-PCR (RSV, Flu A&B, Covid) Throat     Status: None   Collection Time: 07/16/24  5:54 PM   Specimen: Throat; Nasal Swab  Result Value Ref Range Status   SARS Coronavirus 2 by RT PCR  NEGATIVE NEGATIVE Final    Comment: (NOTE) SARS-CoV-2 target nucleic acids are NOT DETECTED.  The SARS-CoV-2 RNA is generally detectable in upper respiratory specimens during the acute phase of infection. The lowest concentration of SARS-CoV-2 viral copies this assay can detect is 138 copies/mL. A negative result does not preclude SARS-Cov-2 infection and should not be used as the sole basis for treatment or other patient management decisions. A negative result may occur with  improper specimen collection/handling, submission of specimen other than nasopharyngeal swab, presence of viral mutation(s) within the areas targeted by this assay, and inadequate number of viral copies(<138 copies/mL). A negative result must be combined with clinical observations, patient history, and epidemiological information. The expected result is Negative.  Fact Sheet for Patients:  bloggercourse.com  Fact Sheet for Healthcare Providers:  seriousbroker.it  This test is no t yet approved or cleared by the [place]  FDA and  has been authorized for detection and/or diagnosis of SARS-CoV-2 by FDA under an Emergency Use Authorization (EUA). This EUA will remain  in effect (meaning this test can be used) for the duration of the COVID-19 declaration under Section 564(b)(1) of the Act, 21 U.S.C.section 360bbb-3(b)(1), unless the authorization is terminated  or revoked sooner.       Influenza A by PCR NEGATIVE NEGATIVE Final   Influenza B by PCR NEGATIVE NEGATIVE Final    Comment: (NOTE) The Xpert Xpress SARS-CoV-2/FLU/RSV plus assay is intended as an aid in the diagnosis of influenza from Nasopharyngeal swab specimens and should not be used as a sole basis for treatment. Nasal washings and aspirates are unacceptable for Xpert Xpress SARS-CoV-2/FLU/RSV testing.  Fact Sheet for Patients: bloggercourse.com  Fact Sheet for  Healthcare Providers: seriousbroker.it  This test is not yet approved or cleared by the [place]  FDA and has been authorized for detection and/or diagnosis of SARS-CoV-2 by FDA under an Emergency Use Authorization (EUA). This EUA will remain in effect (meaning this test can be used) for the duration of the COVID-19 declaration under Section 564(b)(1) of the Act, 21 U.S.C. section 360bbb-3(b)(1), unless the authorization is terminated or revoked.     Resp Syncytial Virus by PCR NEGATIVE NEGATIVE Final    Comment: (NOTE) Fact Sheet for Patients: bloggercourse.com  Fact Sheet for Healthcare Providers: seriousbroker.it  This test is not yet approved or cleared by the [place]  FDA and has been authorized for detection and/or diagnosis of SARS-CoV-2 by FDA under an Emergency Use Authorization (EUA). This EUA will remain in effect (meaning this test can be used) for the duration of the COVID-19 declaration under Section 564(b)(1) of the Act, 21 U.S.C. section 360bbb-3(b)(1), unless the authorization is terminated or revoked.  Performed at [facility], [address], [place], [place] [address]   Group A Strep by PCR     Status: None   Collection Time: 07/16/24  5:54 PM   Specimen: Throat; Sterile Swab  Result Value Ref Range Status   Group A Strep by PCR NOT DETECTED NOT DETECTED Final    Comment: Performed at [facility], [address] [place], [place] [address]  Resp panel by RT-PCR (RSV, Flu A&B, Covid) Anterior Nasal Swab     Status: None   Collection Time: 07/18/24 10:37  PM   Specimen: Anterior Nasal Swab  Result Value Ref Range Status   SARS Coronavirus 2 by RT PCR NEGATIVE NEGATIVE Final   Influenza A by PCR NEGATIVE NEGATIVE Final   Influenza B by PCR NEGATIVE NEGATIVE Final    Comment: (NOTE) The Xpert Xpress SARS-CoV-2/FLU/RSV plus assay is  intended as an aid in the diagnosis of influenza from Nasopharyngeal swab specimens and should not be used as a sole basis for treatment. Nasal washings and aspirates are unacceptable for Xpert Xpress SARS-CoV-2/FLU/RSV testing.  Fact Sheet for Patients: bloggercourse.com  Fact Sheet for Healthcare Providers: seriousbroker.it  This test is not yet approved or cleared by the [place]  FDA and has been authorized for detection and/or diagnosis of SARS-CoV-2 by FDA under an Emergency Use Authorization (EUA). This EUA will remain in effect (meaning this test can be used) for the duration of the COVID-19 declaration under Section 564(b)(1) of the Act, 21 U.S.C. section 360bbb-3(b)(1), unless the authorization is terminated or revoked.     Resp Syncytial Virus by PCR NEGATIVE NEGATIVE Final    Comment: (NOTE) Fact Sheet for Patients: bloggercourse.com  Fact Sheet for Healthcare Providers: seriousbroker.it  This test is not yet approved or cleared by the [place]  FDA and has been authorized for detection and/or diagnosis of SARS-CoV-2 by FDA under an Emergency Use Authorization (EUA). This EUA will remain in effect (meaning this test can be used) for the duration of the COVID-19 declaration under Section 564(b)(1) of the Act, 21 U.S.C. section 360bbb-3(b)(1), unless the authorization is terminated or revoked.  Performed at [facility], [address] [place], [place] [address]   Respiratory (~20 pathogens) panel by PCR     Status: Abnormal   Collection Time: 07/19/24  7:58 AM   Specimen: Nasopharyngeal Swab; Respiratory  Result Value Ref Range Status   Adenovirus NOT DETECTED NOT DETECTED Final   Coronavirus 229E NOT DETECTED NOT DETECTED Final    Comment: (NOTE) The Coronavirus on the Respiratory Panel, DOES NOT test for the novel  Coronavirus (2019 nCoV)     Coronavirus HKU1 NOT DETECTED NOT DETECTED Final   Coronavirus NL63 NOT DETECTED NOT DETECTED Final   Coronavirus OC43 NOT DETECTED NOT DETECTED Final   Metapneumovirus NOT DETECTED NOT DETECTED Final   Rhinovirus / Enterovirus DETECTED (A) NOT DETECTED Final   Influenza A NOT DETECTED NOT DETECTED Final   Influenza B NOT DETECTED NOT DETECTED Final   Parainfluenza Virus 1 NOT DETECTED NOT DETECTED Final   Parainfluenza Virus 2 NOT DETECTED NOT DETECTED Final   Parainfluenza Virus 3 NOT DETECTED NOT DETECTED Final   Parainfluenza Virus 4 NOT DETECTED NOT DETECTED Final   Respiratory Syncytial Virus NOT DETECTED NOT DETECTED Final   Bordetella pertussis NOT DETECTED NOT DETECTED Final   Bordetella Parapertussis NOT DETECTED NOT DETECTED Final   Chlamydophila pneumoniae NOT DETECTED NOT DETECTED Final   Mycoplasma pneumoniae NOT DETECTED NOT DETECTED Final    Comment: Performed at [facility], [address] [place], [place] [address]     Radiology Studies: DG Chest Port 1 View Result Date: 07/18/2024 EXAM: 1 VIEW(S) XRAY OF THE CHEST 07/18/2024 10:53:39 PM COMPARISON: 07/16/2024 CLINICAL HISTORY: SOB, Cough. FINDINGS: LUNGS AND PLEURA: No focal pulmonary opacity. No pleural effusion. No pneumothorax. HEART AND MEDIASTINUM: No acute abnormality of the cardiac and mediastinal silhouettes. BONES AND SOFT TISSUES: No acute osseous abnormality. IMPRESSION: 1. No active cardiopulmonary disease. Electronically signed by: Franky Crease MD 07/18/2024 10:55 PM  EST RP Workstation: [id number]    Scheduled Meds:  aspirin  EC  81 mg Oral q AM   atorvastatin   80 mg Oral Daily   busPIRone   30 mg Oral q AM   And   busPIRone   15 mg Oral Q1200   And   busPIRone   15 mg Oral QPM   doxycycline   100 mg Oral Q12H   [START ON 07/21/2024] empagliflozin   10 mg Oral QAC breakfast   escitalopram   20 mg Oral Daily   ezetimibe   10 mg Oral Daily   gabapentin   600 mg Oral BID   guaiFENesin   1,200 mg Oral BID    ipratropium-albuterol   3 mL Nebulization TID PC   levothyroxine   112 mcg Oral Daily   metFORMIN   500 mg Oral BID WC   [START ON 07/21/2024] methylPREDNISolone  (SOLU-MEDROL ) injection  40 mg Intravenous Q24H   nebivolol   10 mg Oral Daily   nicotine   21 mg Transdermal Daily   pantoprazole   40 mg Oral Daily   ticagrelor   60 mg Oral BID   Continuous Infusions:   LOS: 1 day   Time spent: 65 minutes. No charge  Camellia Door, DO  [facility]  07/20/2024, 12:26 PM   [facility] Admission Criteria Checklist:  Formal consent explained in detail and signed at the bedside: yes Patient meets inpatient admission criteria (see below for further details) yes Is pt Medicare FFS/[employer] Medicare-Medicaid, [employer], [employer] ( required for initial launch with plan to expand)? yes Lives within 25 mil/ 30 min from [facility] within [place](pt may stay with family member during admission who lives within 25 miles or 30 min from [facility] w/in [facility])? yes Hemodynamically stable with relatively low risk of clinical deterioration-not requiring ICU? yes Age >55? yes Does not require frequent touch-points or complex interventions/medications (ie Titrated Infusions (IV insulin , heparin  drips, vasoactive drips, use of infused or injectable controlled substances, patients on insulin )? yes Any Behavioral Health comorbidities likely to increase risk for in-home care (ie Acute delirium or experiencing a marked altered mental status and cause is not a treatable condition in the home)? no Has the patient been on BIPAP during course of ED evaluation or hospitalization? no IF YES, Has the patient been off of BIPAP for >24 hours(If NO-THEN PATIENT DOES MEET INCLUSION CRITERIA)? not applicable On Room Air or Needs oxygen  at home (<6L)? is on oxygen  at 2-3 L/min per nasal cannula. Active safety concerns (ie Unable to use bedside commode independently and lacks caregiver support for safety- needs SNF  placement, unable to obtain IV access)? no Has skin check been performed? yes  Has Physical Therapy screened the patient? yes  Common admission diagnoses including: CAP, COPD Exacerbation, Acute on chronic heart failure, Cellulitis, UTI , dehydration, acute resp failure with hypoxia (requiring <5L)   Social Screening:  - Has the family been directly contacted about Hospital [facility] Home program with consent obtained (if yes- please document who was spoken to with name and phone number)? yes [name] Munroe [phone]  -Was the family approached about the use of [facility] pharmacy for medications at discharge? yes Denies significant ETOH intake? yes Does not smoke and understands may not smoke in the presence of oxygen ? no. Pt does smoke but agrees not to smoke while under the care of Hospital [facility] Home. Patient states able to use iPad/phone for communication/has family who is able to use? yes Patient has agreed to be compliant with medication and treatment regimen of the program? yes Any  active drug use in patient or primary caregiver including daily dosing of methadone? no Stable home environment ( access to appropriate heating in cold conditions and/or appropriate air conditioning in hot conditions and/or no running water/electricity)? yes  No aggressive pets at home? no Firearm present? no  With ability or willingness to store them unloaded in a locked case for duration of hospitalization? yes Ambulatory? yes  mild difficulty, ambulates with a walker Bed bugs present on home evaluation? no Family support system in place? yes Patient feels safe at home and does not endorse any violence? yes Any actively decompensated behavioral health issues including agitation/aggressive behavior? no  Patient requests food to be provided by hospital home program? no PT/OT eval completed and not requiring SNF, ALF, inpatient rehab? yes  To be admitted to the Hospital at [facility] program, a patient generally must meet the  following: 1. Requirement for Inpatient Level of Care: The patient's condition must necessitate an inpatient level of care. This is typically indicated by one or more of the following, depending on their specific diagnosis:  Persistent tachycardia despite appropriate treatment (e.g., for Heart Failure, UTI). Persistent tachypnea (rapid breathing) or dyspnea (shortness of breath) that hasn't improved sufficiently with observation care (e.g., for Heart Failure, Pneumonia, Viral Illness, COVID). Hypoxemia (low oxygen  levels), such as a new need for oxygen , an increased need from baseline, or specific oxygen  saturation levels (e.g., SpO2 <90-94% depending on the condition) that persist despite observation (e.g., for Heart Failure, COPD, Pneumonia, Viral Illness, COVID). Need for Intravenous (IV) hydration due to an inability to maintain oral hydration, which persists despite observation care (e.g., for Cellulitis, UTI, Viral Illness, COVID). Specific to Heart Failure: Persistent pulmonary edema, indicated by a new oxygen  need, lack of improvement with IV diuretics, and ongoing tachypnea/dyspnea. Specific to COPD: A decrease in known baseline resting oxygen  saturation (SpO2) by 4% or more, or an increase in pre-existing supplemental oxygen  requirements, which persists despite observation and requires continued close monitoring. Specific to Pneumonia: A Pneumonia Severity Index (PSI) class IV (moderate risk). Specific to Cellulitis: Failure of outpatient antibiotic therapy (indicated by progression or no improvement after a minimum of 48 hours on an adequate regimen) or a clinical presentation (like acuity or rapidity of progression) that requires the intensity of monitoring found in an inpatient setting. Specific to UTI: Persistence or worsening of clinical findings like fever, pain, or dehydration despite observation care; presence of significant uropathy; suspected infection of an indwelling prosthetic  device, stent, implant, or graft; or pregnancy with suspected pyelonephritis.  2. Appropriateness for Hospital at Home Setting: The patient's overall clinical picture, including the severity of their illness, their care needs, and their medical history and comorbidities, must be suitable for management in the Hospital at Home environment. This essentially means that none of the exclusion criteria (listed below) are met.  Unified Exclusion Criteria for Hospital at Home Admission: A patient would not be eligible for Hospital at Home if any of the following are present: Hemodynamic Instability: Hypotension (low blood pressure) is present. Respiratory Instability or Needs Beyond Program Capability: There is a new need for invasive or noninvasive ventilatory assistance (like BiPAP or a ventilator). Oxygenation is not sufficient, generally indicated if an FiO2 (fraction of inspired oxygen ) of 45% (which is about 6 Liters/minute via nasal cannula) or more is required to keep oxygen  saturation (SpO2) at 90% or greater. Monitoring or Procedural Needs Beyond Program Capability: There is a need for invasive monitoring, such as a pulmonary artery catheter or  an arterial line. There is a need for immediate-response telemetry monitoring (for dangerous arrhythmia detection and subsequent immediate intervention). The required medication regimen is beyond the capabilities of [facility] (e.g., dosing intervals are too frequent for home administration). There is a need for a procedure that cannot be performed by the [facility] team (e.g., significant wound debridement or abscess drainage for cellulitis, or percutaneous nephrostomy for a complicated UTI). Significant Organ Dysfunction or Markers of Severe Illness: Mental status is not at baseline, or there is altered mental status suggestive of inadequate perfusion. Renal (kidney) function is unstable or showing an ongoing decline. There is evidence of  inadequate perfusion, such as metabolic acidosis or myocardial ischemia. Uncompensated acidosis is present. Condition-Specific Severity or Complications Making Home Care Unsuitable: For Heart Failure: Known severe cardiac valvular disease (e.g., aortic stenosis, mitral regurgitation); or severe peripheral edema that impairs the ability to urinate or ambulate. For COPD: Known concurrent comorbidity or finding that indicates a higher-risk COPD exacerbation (e.g., pulmonary fibrosis, cavitation, pleural effusion, pneumothorax, rib fracture). For Pneumonia: Pneumonia Severity Index (PSI) class V (indicating high risk for inpatient mortality); known concurrent comorbidity or finding that indicates higher-risk pneumonia (e.g., pulmonary fibrosis, cavitation, large or loculated pleural effusion); or a concomitant serious infectious process like endocarditis or empyema. For Cellulitis: Orbital, periorbital, or necrotizing infection is suspected; or a concomitant serious infectious process like endocarditis, septic emboli, or septic joint space infection. For UTI: Urinary tract obstruction (e.g., kidney stone, bladder outlet obstruction); or a concomitant serious infectious process like endocarditis or septic emboli. For Viral Illness & COVID-19: A concomitant serious infectious process like endocarditis or empyema.  General Comorbidities or Status:  The patient is significantly immunosuppressed (this applies to Pneumonia, Cellulitis, UTI, Viral Illness, and COVID-19). The patient meets inpatient admission criteria for a second diagnosis, or has care needs beyond the capabilities of [facility] due to an active clinically significant comorbidity. (This is a general exclusion across all listed conditions)  "

## 2024-07-20 NOTE — Assessment & Plan Note (Addendum)
 07/23/2024 stable. Home demadex  has been on hold, resuming tomorrow. --Monitor BMP

## 2024-07-20 NOTE — Assessment & Plan Note (Addendum)
 07/23/2024 Body mass index is 32.5 kg/m.

## 2024-07-20 NOTE — Progress Notes (Addendum)
 2005-outreach to patient to complete introduction, assessment, and overnight monitoring plan. Unable to reach patient, left voicemail to call.   2006-outreach to patient's listed emergency contact/daughter- Unable to reach,  left voicemail to return call.   2012--Inbound call received from emergency contact/daughter who reported she is en route to the patient's home. RN introduction, paramedic ETA, and questions addressed. Emergency contact/daughter agreed to a video call once Paramedic arrives to the home.  [id number] Video call completed in response to a current health alert. Patient identifiers were verified, and RN introduction completed. Patient was alert and oriented 3, seated in a chair, with nasal cannula oxygen  intact. Patient and daughter reported respiratory concerns; however, no signs of acute respiratory distress were noted that required immediate emergency intervention.   Patients daughter reported that upon arriving at the home, the patients SpO? readings were in the low 80s. The patient received all scheduled respiratory medications, which were reported to provide relief for shortness of breath. Patient stated she had been taking a nap at the time of the RN outreach and was lying supine, which she reported contributed to increased shortness of breath and wheezing.   The patient reported the last bowel movement on 1/6 and described difficulty with regular bowel movements, accompanied by abdominal discomfort and symptoms of acid reflux. Patient was able to take remaining scheduled PRN and oral medications as ordered.   The paramedic arrived at the home during the encounter; daughter remained present. Patient reported feeling stable at the conclusion of the call and confirmed no further immediate needs.   2213-APP notified of change in condition.

## 2024-07-20 NOTE — Progress Notes (Signed)
 "  PROGRESS NOTE    [name] [name]  FMW:[id number] DOB: 03-01-1956 DOA: 07/18/2024 PCP: Vicci Barnie [name], MD   Brief Narrative: [name] [name] is a 69 y.o. female with a history of hyperlipidemia, hypertension, diabetes mellitus type 2, diabetic polyneuropathy, prolonged QTc, anxiety, depression, GERD, hypothyroidism, COPD, chronic respiratory failure on 4 L/min (uses 3-5 per patient report), tobacco use.  Patient presented secondary to respiratory distress and found to have a COPD exacerbation with associated acute respiratory failure. She was started on treatment for her COPD exacerbation with steroids and albuterol , in addition to requiring BiPAP for hypoxia and hypercarbia.   Assessment/Plan:  COPD exacerbation Unclear precipitant, although patient reports some recent respiratory illness. No sick contacts. Afebrile. Patient started on Solu-medrol  and Duoneb. - Continue Solu-medrol  - Continue Duoneb - Continue doxycycline   Acute on chronic respiratory failure with hypoxia Acute respiratory failure with hypercapnia Present on admission and secondary to COPD exacerbation. Requiring BiPAP initially and is back to home supplemental oxygen . CO2 on BMP improved. - Continue supplemental oxygen   Leukocytosis Likely reactive. Worsened with steroids. No evidence of infection.  GERD - Continue Protonix   CAD No chest pain symptoms - Continue bisoprolol, Lipitor , Zetia , aspirin  and Brilinta   Chronic HFpEF Stable. No evidence of acute heart failure at this time.  Diabetes mellitus type 2 Well controlled based on hemoglobin A1C of 6.5%.   Anxiety - Continue BuSpar   Diabetic polyneuropathy - Continue gabapentin   Acquired hypothyroidism - Continue levothyroxine  112 mcg  Hyperlipidemia - Continue Lipitor   Tobacco use Ongoing. Patient counseled on smoking cessation. She is on Chantix  as an outpatient. Nicotine  added for while inpatient. - Continue nicotine  patch   DVT  prophylaxis: Lovenox  Code Status:   Code Status: Full Code Family Communication: None at bedside Disposition Plan: Patient is transferring to hospital at home service   Consultants:  None  Procedures:  None  Antimicrobials: None    Subjective: Patient reports she still feels bad. She cannot elaborate further. She reports she is doing better than on admission and she is breathing better than on admission.  Objective: BP 129/71 (BP Location: Left Arm)   Pulse 75   Temp 98.2 F (36.8 C) (Oral)   Resp 17   Ht 5' 1 (1.549 m)   Wt 78 kg   SpO2 100%   BMI 32.50 kg/m   Examination:  General exam: Appears calm and comfortable. Respiratory system: Diminished with wheezing. Respiratory effort normal. Cardiovascular system: S1 & S2 heard, RRR. Gastrointestinal system: Abdomen is nondistended, soft and nontender. Normal bowel sounds heard. Central nervous system: Alert and oriented. No focal neurological deficits. Psychiatry: Judgement and insight appear normal. Mood & affect appropriate.    Data Reviewed: I have personally reviewed following labs and imaging studies   Last CBC Lab Results  Component Value Date   WBC 18.2 (H) 07/20/2024   HGB 11.8 (L) 07/20/2024   HCT 38.3 07/20/2024   MCV 93.6 07/20/2024   MCH 28.9 07/20/2024   RDW 16.4 (H) 07/20/2024   PLT 437 (H) 07/20/2024     Last metabolic panel Lab Results  Component Value Date   GLUCOSE 266 (H) 07/19/2024   NA 139 07/19/2024   K 3.9 07/19/2024   CL 99 07/19/2024   CO2 30 07/19/2024   BUN 25 (H) 07/19/2024   CREATININE 0.78 07/19/2024   GFRNONAA >60 07/19/2024   CALCIUM  9.4 07/19/2024   PHOS 2.9 07/19/2024   PROT 6.5 07/19/2024   ALBUMIN 4.0 07/19/2024   LABGLOB  2.2 06/26/2023   AGRATIO 2.0 09/14/2021   BILITOT <0.2 07/19/2024   ALKPHOS 83 07/19/2024   AST 18 07/19/2024   ALT 29 07/19/2024   ANIONGAP 10 07/19/2024     Creatinine Clearance: Estimated Creatinine Clearance: 63.6 mL/min (by  C-G formula based on SCr of 0.78 mg/dL).  Recent Results (from the past 240 hours)  Resp panel by RT-PCR (RSV, Flu A&B, Covid) Throat     Status: None   Collection Time: 07/16/24  5:54 PM   Specimen: Throat; Nasal Swab  Result Value Ref Range Status   SARS Coronavirus 2 by RT PCR NEGATIVE NEGATIVE Final    Comment: (NOTE) SARS-CoV-2 target nucleic acids are NOT DETECTED.  The SARS-CoV-2 RNA is generally detectable in upper respiratory specimens during the acute phase of infection. The lowest concentration of SARS-CoV-2 viral copies this assay can detect is 138 copies/mL. A negative result does not preclude SARS-Cov-2 infection and should not be used as the sole basis for treatment or other patient management decisions. A negative result may occur with  improper specimen collection/handling, submission of specimen other than nasopharyngeal swab, presence of viral mutation(s) within the areas targeted by this assay, and inadequate number of viral copies(<138 copies/mL). A negative result must be combined with clinical observations, patient history, and epidemiological information. The expected result is Negative.  Fact Sheet for Patients:  bloggercourse.com  Fact Sheet for Healthcare Providers:  seriousbroker.it  This test is no t yet approved or cleared by the [place]  FDA and  has been authorized for detection and/or diagnosis of SARS-CoV-2 by FDA under an Emergency Use Authorization (EUA). This EUA will remain  in effect (meaning this test can be used) for the duration of the COVID-19 declaration under Section 564(b)(1) of the Act, 21 U.S.C.section 360bbb-3(b)(1), unless the authorization is terminated  or revoked sooner.       Influenza A by PCR NEGATIVE NEGATIVE Final   Influenza B by PCR NEGATIVE NEGATIVE Final    Comment: (NOTE) The Xpert Xpress SARS-CoV-2/FLU/RSV plus assay is intended as an aid in the diagnosis  of influenza from Nasopharyngeal swab specimens and should not be used as a sole basis for treatment. Nasal washings and aspirates are unacceptable for Xpert Xpress SARS-CoV-2/FLU/RSV testing.  Fact Sheet for Patients: bloggercourse.com  Fact Sheet for Healthcare Providers: seriousbroker.it  This test is not yet approved or cleared by the [place]  FDA and has been authorized for detection and/or diagnosis of SARS-CoV-2 by FDA under an Emergency Use Authorization (EUA). This EUA will remain in effect (meaning this test can be used) for the duration of the COVID-19 declaration under Section 564(b)(1) of the Act, 21 U.S.C. section 360bbb-3(b)(1), unless the authorization is terminated or revoked.     Resp Syncytial Virus by PCR NEGATIVE NEGATIVE Final    Comment: (NOTE) Fact Sheet for Patients: bloggercourse.com  Fact Sheet for Healthcare Providers: seriousbroker.it  This test is not yet approved or cleared by the [place]  FDA and has been authorized for detection and/or diagnosis of SARS-CoV-2 by FDA under an Emergency Use Authorization (EUA). This EUA will remain in effect (meaning this test can be used) for the duration of the COVID-19 declaration under Section 564(b)(1) of the Act, 21 U.S.C. section 360bbb-3(b)(1), unless the authorization is terminated or revoked.  Performed at [facility], [address], [place], [place] [address]   Group A Strep by PCR     Status: None   Collection Time: 07/16/24  5:54 PM  Specimen: Throat; Sterile Swab  Result Value Ref Range Status   Group A Strep by PCR NOT DETECTED NOT DETECTED Final    Comment: Performed at [facility], [address] [place], [place] [address]  Resp panel by RT-PCR (RSV, Flu A&B, Covid) Anterior Nasal Swab     Status: None   Collection Time: 07/18/24 10:37 PM    Specimen: Anterior Nasal Swab  Result Value Ref Range Status   SARS Coronavirus 2 by RT PCR NEGATIVE NEGATIVE Final   Influenza A by PCR NEGATIVE NEGATIVE Final   Influenza B by PCR NEGATIVE NEGATIVE Final    Comment: (NOTE) The Xpert Xpress SARS-CoV-2/FLU/RSV plus assay is intended as an aid in the diagnosis of influenza from Nasopharyngeal swab specimens and should not be used as a sole basis for treatment. Nasal washings and aspirates are unacceptable for Xpert Xpress SARS-CoV-2/FLU/RSV testing.  Fact Sheet for Patients: bloggercourse.com  Fact Sheet for Healthcare Providers: seriousbroker.it  This test is not yet approved or cleared by the [place]  FDA and has been authorized for detection and/or diagnosis of SARS-CoV-2 by FDA under an Emergency Use Authorization (EUA). This EUA will remain in effect (meaning this test can be used) for the duration of the COVID-19 declaration under Section 564(b)(1) of the Act, 21 U.S.C. section 360bbb-3(b)(1), unless the authorization is terminated or revoked.     Resp Syncytial Virus by PCR NEGATIVE NEGATIVE Final    Comment: (NOTE) Fact Sheet for Patients: bloggercourse.com  Fact Sheet for Healthcare Providers: seriousbroker.it  This test is not yet approved or cleared by the [place]  FDA and has been authorized for detection and/or diagnosis of SARS-CoV-2 by FDA under an Emergency Use Authorization (EUA). This EUA will remain in effect (meaning this test can be used) for the duration of the COVID-19 declaration under Section 564(b)(1) of the Act, 21 U.S.C. section 360bbb-3(b)(1), unless the authorization is terminated or revoked.  Performed at [facility], [address], [place], [place] [address]   Respiratory (~20 pathogens) panel by PCR     Status: Abnormal   Collection Time: 07/19/24  7:58 AM   Specimen:  Nasopharyngeal Swab; Respiratory  Result Value Ref Range Status   Adenovirus NOT DETECTED NOT DETECTED Final   Coronavirus 229E NOT DETECTED NOT DETECTED Final    Comment: (NOTE) The Coronavirus on the Respiratory Panel, DOES NOT test for the novel  Coronavirus (2019 nCoV)    Coronavirus HKU1 NOT DETECTED NOT DETECTED Final   Coronavirus NL63 NOT DETECTED NOT DETECTED Final   Coronavirus OC43 NOT DETECTED NOT DETECTED Final   Metapneumovirus NOT DETECTED NOT DETECTED Final   Rhinovirus / Enterovirus DETECTED (A) NOT DETECTED Final   Influenza A NOT DETECTED NOT DETECTED Final   Influenza B NOT DETECTED NOT DETECTED Final   Parainfluenza Virus 1 NOT DETECTED NOT DETECTED Final   Parainfluenza Virus 2 NOT DETECTED NOT DETECTED Final   Parainfluenza Virus 3 NOT DETECTED NOT DETECTED Final   Parainfluenza Virus 4 NOT DETECTED NOT DETECTED Final   Respiratory Syncytial Virus NOT DETECTED NOT DETECTED Final   Bordetella pertussis NOT DETECTED NOT DETECTED Final   Bordetella Parapertussis NOT DETECTED NOT DETECTED Final   Chlamydophila pneumoniae NOT DETECTED NOT DETECTED Final   Mycoplasma pneumoniae NOT DETECTED NOT DETECTED Final    Comment: Performed at [facility], [address], [place], [place] [address]      Radiology Studies: DG Chest Port 1 View Result Date: 07/18/2024 EXAM:  1 VIEW(S) XRAY OF THE CHEST 07/18/2024 10:53:39 PM COMPARISON: 07/16/2024 CLINICAL HISTORY: SOB, Cough. FINDINGS: LUNGS AND PLEURA: No focal pulmonary opacity. No pleural effusion. No pneumothorax. HEART AND MEDIASTINUM: No acute abnormality of the cardiac and mediastinal silhouettes. BONES AND SOFT TISSUES: No acute osseous abnormality. IMPRESSION: 1. No active cardiopulmonary disease. Electronically signed by: Franky Crease MD 07/18/2024 10:55 PM EST RP Workstation: [id number]      LOS: 1 day    Elgin Lam, MD [facility] 07/20/2024, 11:48 AM   If 7PM-7AM, please contact  night-coverage [email or web address]  "

## 2024-07-20 NOTE — Assessment & Plan Note (Addendum)
 Chronic. Not exacerbated.  Alkalosis improved CO2 34>>30 >>31 with Demadex  on hold (hypercapnia +/- contraction).  proBNP 349.0 (normal for age). 07/23/2024  --Demadex  resumed today --Monitor volume status closely --BMP in AM

## 2024-07-20 NOTE — Assessment & Plan Note (Addendum)
 07/23/2024 continue with buspar  and prn klonopin .

## 2024-07-20 NOTE — Assessment & Plan Note (Addendum)
 07/23/2024 due to COPD exacerbation from +Rhinovirus infection. Pt not requiring Bipiap. Chronic O2 requirement of 4 L/min at home (5-6 L/min with exertion).  O2 at rest turned up to 5 L/min due to work of breathing and borderline O2 sats --Continue with home O2.  --Titrate to keep O2 sats >=90%

## 2024-07-20 NOTE — Assessment & Plan Note (Addendum)
 07/23/2024  --Continue with TID Duonebs --Q6h PRN nebs.  --Continue with IV Solumedrol 40 mg today --Transition to PO Prednisone  when further clinical improvement, and would do a long taper  --Given viral infection on top of her severe COPD, she may take a few more days of IV steroids to calm her acute inflammatory process down. --She has previously scheduled Pulmonology follow up upcoming

## 2024-07-21 DIAGNOSIS — B348 Other viral infections of unspecified site: Secondary | ICD-10-CM

## 2024-07-21 DIAGNOSIS — I5030 Unspecified diastolic (congestive) heart failure: Secondary | ICD-10-CM

## 2024-07-21 LAB — COMPREHENSIVE METABOLIC PANEL WITH GFR
ALT: 29 U/L (ref 0–44)
AST: 22 U/L (ref 15–41)
Albumin: 3.6 g/dL (ref 3.5–5.0)
Alkaline Phosphatase: 73 U/L (ref 38–126)
Anion gap: 7 (ref 5–15)
BUN: 14 mg/dL (ref 8–23)
CO2: 30 mmol/L (ref 22–32)
Calcium: 8.9 mg/dL (ref 8.9–10.3)
Chloride: 104 mmol/L (ref 98–111)
Creatinine, Ser: 0.65 mg/dL (ref 0.44–1.00)
GFR, Estimated: >60 mL/min
Glucose, Bld: 83 mg/dL (ref 70–99)
Potassium: 4.4 mmol/L (ref 3.5–5.1)
Sodium: 141 mmol/L (ref 135–145)
Total Bilirubin: <0.2 mg/dL (ref 0.0–1.2)
Total Protein: 5.7 g/dL — ABNORMAL LOW (ref 6.5–8.1)

## 2024-07-21 LAB — CBC WITH DIFFERENTIAL/PLATELET
Abs Immature Granulocytes: 0.08 K/uL — ABNORMAL HIGH (ref 0.00–0.07)
Basophils Absolute: 0 K/uL (ref 0.0–0.1)
Basophils Relative: 0 %
Eosinophils Absolute: 0.2 K/uL (ref 0.0–0.5)
Eosinophils Relative: 1 %
HCT: 35.1 % — ABNORMAL LOW (ref 36.0–46.0)
Hemoglobin: 10.6 g/dL — ABNORMAL LOW (ref 12.0–15.0)
Immature Granulocytes: 1 %
Lymphocytes Relative: 22 %
Lymphs Abs: 2.9 K/uL (ref 0.7–4.0)
MCH: 28.3 pg (ref 26.0–34.0)
MCHC: 30.2 g/dL (ref 30.0–36.0)
MCV: 93.9 fL (ref 80.0–100.0)
Monocytes Absolute: 1.2 K/uL — ABNORMAL HIGH (ref 0.1–1.0)
Monocytes Relative: 9 %
Neutro Abs: 9.1 K/uL — ABNORMAL HIGH (ref 1.7–7.7)
Neutrophils Relative %: 67 %
Platelets: 429 K/uL — ABNORMAL HIGH (ref 150–400)
RBC: 3.74 MIL/uL — ABNORMAL LOW (ref 3.87–5.11)
RDW: 16.2 % — ABNORMAL HIGH (ref 11.5–15.5)
WBC: 13.4 K/uL — ABNORMAL HIGH (ref 4.0–10.5)
nRBC: 0 % (ref 0.0–0.2)

## 2024-07-21 LAB — PRO BRAIN NATRIURETIC PEPTIDE: Pro Brain Natriuretic Peptide: 349 pg/mL — ABNORMAL HIGH

## 2024-07-21 LAB — MAGNESIUM: Magnesium: 1.6 mg/dL — ABNORMAL LOW (ref 1.7–2.4)

## 2024-07-21 MED ORDER — NICOTINE POLACRILEX 2 MG MT GUM
2.0000 mg | CHEWING_GUM | OROMUCOSAL | Status: DC | PRN
  Administered 2024-07-24 – 2024-07-25 (×2): 2 mg via ORAL
  Filled 2024-07-21 (×13): qty 1

## 2024-07-21 MED ORDER — MAGNESIUM SULFATE 2 GM/50ML IV SOLN
2.0000 g | Freq: Once | INTRAVENOUS | Status: AC
  Administered 2024-07-21: 2 g via INTRAVENOUS
  Filled 2024-07-21: qty 50

## 2024-07-21 NOTE — Progress Notes (Signed)
 The virtual nurse called and stated that she was unable to get any readings from current health. It was unable to help either.   This paramedic drove to the pt's house and unlocked her door using the provided code. The pt was awoken to inform of the situation. Her arm band was placed on the charger and the tablet was checked and had no issues.the internet modem was then checked and noted to not be connected. It appears that the outlet that it was connected to was no longer working.  The modem was moved and placed in the kitchen. All the light appeared and the virtual nurse stated she was now getting back logged information. The arm band was paced on the pt. The virtual nurse gave the okay this paramedic informed the pt of her departure and locked the door when leaving

## 2024-07-21 NOTE — Progress Notes (Signed)
 "  [facility] Daily Progress Note   Patient: [name] [name]  MRN: [id number]  DOB: 03/29/1956  DOA: 07/18/2024  DOS: the patient was seen and examined on @TODAY @    Patient identified themself as [name] [name]  DOB 06/19/1956  Medic Lauraine Faes present in the home during encounter and performed the assessment and physical exam. Patient was seen today via video conference; my physical location [place], [place]    Brief hospital course:  CC: SOB HPI: [name] [name] is a 69 y.o. female with medical history significant of hypertension, hyperlipidemia, T2DM, diabetic polyneuropathy, prolonged QTc, anxiety, depression, GERD, hypothyroidism, COPD, chronic hypoxic respiratory failure on 4 LPM of oxygen , chronic tobacco abuse who presents emergency department due to respiratory distress.  She was seen at [facility] on 07/16/2024 for same symptoms.  COVID and flu test done with negative and she was treated at the ED and discharged home with prednisone .  She has only taken 1 dose since that time. Shortness of breath worsened while going to the bathroom at home today and EMS was activated.  On arrival of EMS team, breathing treatment with albuterol  and Atrovent  were provided.  Solu-Medrol  125 mg x 1, magnesium  and epi were given. Patient was recently admitted from 12/4 to 12/9 due to acute on chronic hypoxic respiratory failure due to COPD expiration and acute bronchitis and was treated with IV steroids, azithromycin , nebulizer treatments.     ED course In the emergency department, temperature was 37.2 F, BP 152/48, O2 sat was 100% on BiPAP with FiO2 50%.  Workup in the ED showed normocytic anemia and leukocytosis. Chest x-ray showed streaky atelectasis or scarring at the left base. TRH was asked to admit patient.  Significant Events: Admitted 07/18/2024 for COPD exacerbation, acute on chronic hypoxic and hypercapnic respiratory failure 07-19-2024 RVP POSITIVE for Rhino/Enterovirus 07-21-2023 pt  consented and transferred to [facility] program  Admission Labs: VBG pH 7.40, PCO2 of 56 Lactic acid 1.5 WBC 15.7, HgB 11.3 Plt 393 proBNP 214 Na 139, K 3.6, CO2 of 34, BUN 26, Scr 0.85, glu 216 Covid/Flu/RSV negative Mg 2.3  Admission Imaging Studies: CXR No active cardiopulmonary disease.   Significant Labs: Mg 1.6 (07/21/24) CO2 34 >> 30 (07/21/24) proBNP 349.0 (07/21/24) WBC trend 18.2 >> 13.4 Hbg trend 11.8 >> 10.6  Significant Imaging Studies:      Assessment and Plan:  * Acute on chronic respiratory failure with hypoxia and hypercapnia (HCC) 07/21/2024 due to COPD exacerbation from +Rhinovirus infection. Pt not requiring Bipiap. Chronic O2 requirement of 4 L/min at home (5-6 L/min with exertion).  --Continue with home O2.  --Titrate to keep O2 sats >=90%   Chronic hypoxic respiratory failure (HCC) 07/21/2024 chronically on 4 L/min O2 at home, per daughter turns up to 5-6 L/min with activity.  Acutely exacerbated by viral infection and COPD exacerbation. Keep O2 sats >= 90%   COPD with acute exacerbation (HCC) 07/21/2024  --Continue with TID Duonebs --Q6h PRN nebs.  --Continue with IV Solumedrol 40 mg daily.  --Given viral infection on top of her severe COPD, she may take a few more days of IV steroids to calm her acute inflammatory process down.   Rhinovirus infection 07/21/2024  --continue supportive care.  --On po doxycyline to prevent bacterial infection of her severe COPD.   TOBACCO ABUSE 07/21/2024 pt advised multiple times during [facility] consenting process that she is not allowed to smoke while under the care of Hospital [facility].  Dtr [name] [name] will go to pt's home today and get rid of all tobacco materials, cigarettes, lighters, etc.  --Continue with nicotine  patches --Add nicorette  gum PRN   Hypomagnesemia 07/21/24 - Mg 1.6 today --2 g IV Mg-sulfate to replace --Repeat Mg level tomorrow  (HFpEF) heart failure with preserved ejection fraction  (HCC) 07/21/2024 chronic. Not exacerbated.  Alkalosis improved to day CO2 34>>30 with Demadex  on hold (may be due to hypercapnia +/- contraction).  proBNP 349.0 (normal for age). --Continue hold Demadex  for now --Monitor volume status closely --BMP in AM   Advanced directives, counseling/discussion 07/21/2024 pt was DNR/DNI prior to admission. Pt changed herself back to FULL CODE. Dtr was to talk to outpatient palliative care about GOC and code status. Dtr thinks pt should be DNR.     Non-insulin  dependent type 2 diabetes mellitus (HCC) 07/21/2024 continue with jardiance . Monitor CBG as needed.restart metformin .   Long-term current use of benzodiazepine 07/21/2024 continue klonopin  0.5 mg bid prn. anxiety   CKD (chronic kidney disease) stage 2, GFR 60-89 ml/min 07/21/2024 stable. Home demadex  dose on hold for now.   Major depressive disorder, recurrent, in full remission with anxious distress 07/21/2024   GAD (generalized anxiety disorder) 07/21/2024 continue with buspar  and prn klonopin .   Hyperlipidemia 07/21/2024 continue with zetia  and lipitor .   Essential hypertension 07/21/2024 pt on bystolic  for HTN. Not volume overloaded at this time. Home dose of demadex  currently on hold.   Chronic depression 07/21/2024 continue lexapro  and buspar    Acquired hypothyroidism 07/21/2024 continue synthroid  112 mcg daily.   Obesity, Class II, BMI 35-39.9 07/21/2024 Body mass index is 32.5 kg/m.       Patient Active Problem List   Diagnosis Date Noted   Acute on chronic respiratory failure with hypoxia and hypercapnia (HCC) 10/05/2023    Priority: High   Chronic hypoxic respiratory failure (HCC) 09/01/2023    Priority: High   Rhinovirus infection 06/20/2023    Priority: High   COPD with acute exacerbation (HCC) 09/15/2009    Priority: High   TOBACCO ABUSE 07/17/2009    Priority: High   Hypomagnesemia 07/21/2024    Priority: Medium    (HFpEF) heart failure with preserved ejection  fraction (HCC) 06/18/2024    Priority: Medium    Advanced directives, counseling/discussion 09/11/2015    Priority: Medium    Non-insulin  dependent type 2 diabetes mellitus (HCC) 06/18/2024    Priority: Low   Long-term current use of benzodiazepine 11/17/2023    Priority: Low   CKD (chronic kidney disease) stage 2, GFR 60-89 ml/min 11/12/2023    Priority: Low   Major depressive disorder, recurrent, in full remission with anxious distress 05/31/2021    Priority: Low   GAD (generalized anxiety disorder) 01/08/2021    Priority: Low   Essential hypertension 06/07/2013    Priority: Low   Hyperlipidemia 06/07/2013    Priority: Low   Acquired hypothyroidism 12/22/2008    Priority: Low   Chronic depression 12/22/2008    Priority: Low   Obesity, Class II, BMI 35-39.9 07/20/2024   History of depression 06/18/2024   Diabetic neuropathy (HCC) 06/18/2024   Acute on chronic heart failure with preserved ejection fraction (HFpEF, >= 50%) (HCC) 12/05/2023   At risk for polypharmacy 12/04/2023   Long-term current use of antidepressant 11/17/2023   Continuous dependence on cigarette smoking 09/01/2023   History of CAD (coronary artery disease) 09/01/2023   Full code status 06/14/2023   Nicotine  dependence 05/03/2023   Iron  deficiency anemia 11/02/2021   Chronic  neck pain 02/26/2021   Esophageal dysphagia 04/28/2019   Bipolar disorder (HCC) 09/17/2018   Insomnia 10/28/2017   Prediabetes 05/28/2017   Psoriasis 06/22/2015   COPD (chronic obstructive pulmonary disease) (HCC)GOLD E 05/18/2015   Anxiety and depression 07/10/2013   Asthma, chronic 06/07/2013   Emphysema lung (HCC) 06/07/2013   Chronic back pain 06/07/2013   CAD S/P percutaneous coronary angioplasty 06/07/2013   GERD (gastroesophageal reflux disease) 06/07/2013   Breast lump on left side at 3 o'clock position 10/13/2012   Chronic rhinitis 07/17/2009   Lung nodule < 6cm on CT 07/17/2009       Subjective / Interval 24 hour  History:   Patient seen virtually during a.m. medic visit to the home today.  She reports having a rocky night.  There were some issues with her oxygen  dropping after her nasal cannula fell out of her arm.  Patient states this does happen from time to time when she is asleep.  This morning she reports a left-sided headache she feels is related to sinuses, had just been given Tylenol  for that.  States she will let us  know if that does not help.  States her breathing feels better today, currently denies shortness of breath (laying on her side in bed).  She denies fevers or chills but does report feeling very rundown.  She reports some cough, not much and little phlegm production at times other times dry.  She denies other acute complaints at this     Antibiotic Therapy: Anti-infectives (From admission, onward)    Start     Dose/Rate Route Frequency Ordered Stop   07/19/24 1545  doxycycline  (VIBRA -TABS) tablet 100 mg        100 mg Oral Every 12 hours 07/19/24 1531         Procedures: None  Consultants: None          Physical Exam:    07/21/2024    8:00 AM 07/21/2024    6:47 AM 07/20/2024    9:17 PM  Vitals with BMI  Systolic 160  156  Diastolic 80  72  Pulse 72 64 76    Bedside physical exam was performed by Medic listed above. Below exam findings are based on their in person physical exam findings and my observations during virtual encounter.   General exam: awake, alert, no acute distress HEENT: Voice clear, hearing grossly normal  Respiratory system: Lung sounds with inspiratory and expiratory wheezes in all fields worse on right than left, left base diminished, productive sounding cough, on 5 L/min [place] O2 with normal respiratory effort at rest Cardiovascular system: normal S1/S2, RRR, radial pulses strong and regular, no peripheral edema.   Gastrointestinal system: soft, NT, ND Central nervous system: A&O x 3. no gross focal neurologic deficits, normal  speech Psychiatry: normal mood, congruent affect, judgement and insight appear normal    Data Reviewed:  Notable labs Magnesium  1.6 WBC improved 18.2>> 13.4 Hemoglobin 11.8>> 10.6 proBNP 349.0   Family Communication: None present during virtual visit today  Disposition: Status is: Inpatient Remains inpatient appropriate because: Remains on IV steroids pending further clinical improvement of COPD exacerbation in the setting of rhinovirus infection.   Planned Discharge Destination: Home    Time spent: 45 minutes  Author: Burnard DELENA Cunning, DO [facility]  03/04/2024 7:00 AM  For on call review [email or web address].   "

## 2024-07-21 NOTE — Progress Notes (Signed)
 Video call completed with patient. Introduced myself as the CHARITY FUNDRAISER for the evening and discussed plan for the night. Patient made aware that medic will be coming tonight to help assist with bedtime meds. Patient in agreement with plan. Patient encouraged to call RN via phone or tablet if needed prior to medic visit.

## 2024-07-21 NOTE — Assessment & Plan Note (Addendum)
 07/21/24 - Mg 1.6 was replaced 07/23/24 - mg to 1.8 --Monitor & replace Mg PRN

## 2024-07-21 NOTE — Progress Notes (Addendum)
 The virtual nurse called out stating that the pt's SPO2 on current health was reading low and the pt was not answering phone calls to the tablet or her phone.   Upon arrival to the residence the door code was used to get inside. The pt was called for with no answer. When walking into her bedroom the pt was found supine in her bed with equal chest rise and fall noted. The pt was awoken by calling her name and stated that this paramedic was here due to her current health readings. The pt's [place] was noted to be on the floor and she stated  I must have taken it off in my sleep.  The pt states this has happened in the past. A SPO2 was checked and the pt was noted to be sating 84% on RA. She denied any shortness of breath at this time. The pt was placed back on the [place] and assisted with her 0200 Neb treatment. Lung sounds were still diminished and inspiratory/expiratory wheezing. The pt's O2 was turned up to 5 due to the length of [place] tubing.  The pt was assisted back to bed and her [place] was adjusted to the inside of her shirt and behind her to avoid pulling while sleeping. The pt was informed that in the morning she move it back towards the front to avoid any sores. The pt agreed to same. She was asked if anything else could be done for her and she denied same.  The pt was told to call at anytime during the night if anything changes and she agreed to same.

## 2024-07-21 NOTE — Progress Notes (Addendum)
 0109- Alert for Hypoxia for SpO2-unable to reach via tablet.    0114--0118-Alarm Hypoxia for SpO2-Unable to reach patient or emergency contact/daughter via phone. [facility] Paramedic dispatched home to check-in on patient status.    0119-Additioanl outbound call to Emergency contact/daughter. Daughter reached and informed, patient was unable to reach. Emergency contact/Daughter, who was made aware that patient was unable to reach to assess low SpO2 readings in the low 80s. Daughter confirmed she is not in the home with patient to assist and would outreach to the patient.   0120-0200-Inbound call received from Emergency contact/daughter who reported she is unable to reach patient. Daughter made aware, paramedic is on the way to the patients home. Video call completed with patient, paramedic arrived with patient, scheduled respiratory medication completed. Patient confirmed removing nasal canula tubing during sleep, but stable. Emergency contact/daughter notified via phone of patient outcome.  APP notified of change in condition, agreed to increase O2 to [facility] for reminder of the night due to change in condition and length of O2 tubing.

## 2024-07-21 NOTE — Plan of Care (Signed)

## 2024-07-21 NOTE — Progress Notes (Signed)
 0449--Patient reached via video visit. Patient confirmed stable, did not feel comfortable getting 0600 medications on her own, patient is currently alone and prefers to wait for Paramedic visit. Patient instructed and reported understanding of instructions for taking Synthroid . Hypoxia alert addressed patient reported and confirmed she just work up and has considerable movement, no respiratory distress noted.

## 2024-07-21 NOTE — Plan of Care (Signed)
  Problem: Education: Goal: Knowledge of General Education information will improve Description: Including pain rating scale, medication(s)/side effects and non-pharmacologic comfort measures Outcome: Progressing   Problem: Health Behavior/Discharge Planning: Goal: Ability to manage health-related needs will improve Outcome: Progressing   Problem: Clinical Measurements: Goal: Respiratory complications will improve Outcome: Progressing Goal: Cardiovascular complication will be avoided Outcome: Progressing   Problem: Activity: Goal: Risk for activity intolerance will decrease Outcome: Progressing   Problem: Nutrition: Goal: Adequate nutrition will be maintained Outcome: Progressing   Problem: Coping: Goal: Level of anxiety will decrease Outcome: Progressing   Problem: Safety: Goal: Ability to remain free from injury will improve Outcome: Progressing

## 2024-07-21 NOTE — Progress Notes (Signed)
 Called patient on the phone to make her aware of paramedics arrival in 30 mins. She reports feeling tired but okay. No acute distress noted. Denies SOB at this time. Oxygen  on.

## 2024-07-21 NOTE — Progress Notes (Signed)
 Transition of Care [facility]) - Inpatient Brief Assessment   Patient Details  Name: KHARIZMA LESNICK MRN: [id number] Date of Birth: [birth year]  [facility] CM/SW Contact:    Debarah Saunas, RN Phone Number: 07/21/2024, 8:51 AM   Clinical Narrative: From home alone; has supportive daughter. RNCM following for discharge needs.   Transition of Care Asessment: Insurance and Status: Insurance coverage has been reviewed Patient has primary care physician: Yes Versa Louder) Home environment has been reviewed: from home (apartment) alone Prior level of function:: (P) moderate assistance with ADL and mobility.  Has home oxygen  and uses shower chair Prior/Current Home Services: (P) No current home services Social Drivers of Health Review: (P) SDOH reviewed no interventions necessary Readmission risk has been reviewed: (P) No Transition of care needs: (P) transition of care needs identified, TOC will continue to follow

## 2024-07-21 NOTE — Plan of Care (Addendum)
 2040-2140--Patient alert and oriented, contact call via video completed, patient reported, decrease appetite, low hydration intake, productive cough, with confirmed shortness of breath relieved with scheduled and PRN respiratory medications. Patient reported and confirmed need for prn medications to support anxiety.     Problem: Fluid Volume: Goal: Ability to maintain a balanced intake and output will improve Outcome: Not Progressing   Problem: Nutritional: Goal: Maintenance of adequate nutrition will improve Outcome: Not Progressing   Problem: Clinical Measurements: Goal: Respiratory complications will improve Outcome: Not Progressing   Problem: Coping: Goal: Level of anxiety will decrease Outcome: Not Progressing

## 2024-07-21 NOTE — Progress Notes (Signed)
 Pt seen for routine [facility] morning visit. Pt states she slept fairly well other than her [place] coming off last night and other issues but is thankful for team watching over her. Pt still feels a little tired this morning but overall better.  Vital signs and assessment obtained as noted.  L/S insp and exp wheezes both upper fields with right being more pronounced, left lower lobe diminished. Pt reports productive cough white/clear sputum. Heart tones s1, s2, pulses strong and regular. No peripheral edema noted. Abdomen soft and non-tender, active bowel sounds. Pt reports small bm last night but states it was not anything to write home about. Pt feels like she is heading towards constipation. Pt c/o 8/10 left sided throbbing headache feels it may be sinus related. Temp is currently 99.0 oral, no chills or sweats overnight. Pt is on 5 lpm still via [place]. Pt states [place] keeps falling out so I switched her to one of ours. Pt's cbg is currently 64 and she is fasting at this time.  Medications administered as noted. IV care completed including new curos cap. Labs drawn.  Pt had virtual visit with Dr. Fausto and Shanda, RN. Plan of care discussed.   Before leaving I ensured Pt's neb machine was set up in bedroom and is within reach. Pt verbalizes understanding how to use it should she need prn neb treatment before next visit.   Pt encouraged to call with any problems and we will let her know when we are on the way for afternoon visit.

## 2024-07-21 NOTE — Progress Notes (Signed)
 Patient self administered the scheduled duoneb with no issues. No complaints at this time. Oxygen  on at 5L [place].

## 2024-07-21 NOTE — Addendum Note (Signed)
 Addended byBETHA JAYCEE GREIG JINNY on: 07/21/2024 08:24 AM   Modules accepted: Level of Service

## 2024-07-21 NOTE — Progress Notes (Signed)
 [id number]-- Device has been disconnected unable to monitor Sp02 or other data, unable to reach patient via phone or tablet. Current Health rep has confirmed the sensor is out of range, disconnected and not slowly backfilling data. Paramedic dispatched to home, outreach to emergency contact to inform, paramedic was heading back out to the patients home to assist. Current health data began slowly backfilling after changing to different outlets. The patient remained stable.    0449--Alarm Hypoxia for SpO2-85% Outreach to patient via tablet, unable to reach, will continue to monitor need for additional contact call, patient currently showing SpO2 of 93% by the end of video call released.

## 2024-07-21 NOTE — Progress Notes (Signed)
 The pt was seen for her second visit at approximately 2115. The virtual nurse had informed this paramedic that the pt had called her complain  of shortness of breath and her daughter in the home stated that her mom was having labored breathing and low O2 saturations.   Upon entering the home the pt was alert and oriented x4. Her skin was warm,dry and normal in color. She had a strong radial pulse and was noted to be having some difficult in completing her sentences. The pt was been given her nebulizer treatments and most of her night time medication prior to arrival.   Vitals were taken and are as noted. Lung sounds were ausculted and the pt was noted to have both inspiratory and expiratory wheezing with very diminished lower lobes.   The pt was assisted with the rest of her nighttime medications with help of the virtual nurse. The pt stated she was having what felt like acid reflux. The pt's abdomen was soft and non-tender to palpation her bowel sounds were limited and when asked when was the last time she had a regular BM the pt stated it had been about a week. The virtual nurse was informed of same and asked if an order could be put in for acid reflux medication and constipation.   The pt's iv in her wrist was noted to be leaking. Same was cleaned and the locked switched but the IV did continue to leak due to same the IV was removed. The does still have a 18g Iv in the forearm.   The pt was asked if there was anything else she needed help with tonight and she denied same. The pt was told to use her inhaler in the case she needed it and to call if anything changes.  The pt agreed to same.  It was noted that the pt's daughter did leave about the same time this paramedic left.

## 2024-07-22 ENCOUNTER — Telehealth (HOSPITAL_COMMUNITY): Payer: Self-pay

## 2024-07-22 LAB — CBC
HCT: 40.8 % (ref 36.0–46.0)
Hemoglobin: 12.4 g/dL (ref 12.0–15.0)
MCH: 28.7 pg (ref 26.0–34.0)
MCHC: 30.4 g/dL (ref 30.0–36.0)
MCV: 94.4 fL (ref 80.0–100.0)
Platelets: 496 K/uL — ABNORMAL HIGH (ref 150–400)
RBC: 4.32 MIL/uL (ref 3.87–5.11)
RDW: 16 % — ABNORMAL HIGH (ref 11.5–15.5)
WBC: 13.7 K/uL — ABNORMAL HIGH (ref 4.0–10.5)
nRBC: 0 % (ref 0.0–0.2)

## 2024-07-22 LAB — MAGNESIUM: Magnesium: 2 mg/dL (ref 1.7–2.4)

## 2024-07-22 LAB — BASIC METABOLIC PANEL WITH GFR
Anion gap: 6 (ref 5–15)
BUN: 14 mg/dL (ref 8–23)
CO2: 31 mmol/L (ref 22–32)
Calcium: 9.4 mg/dL (ref 8.9–10.3)
Chloride: 103 mmol/L (ref 98–111)
Creatinine, Ser: 0.73 mg/dL (ref 0.44–1.00)
GFR, Estimated: >60 mL/min
Glucose, Bld: 74 mg/dL (ref 70–99)
Potassium: 4.4 mmol/L (ref 3.5–5.1)
Sodium: 140 mmol/L (ref 135–145)

## 2024-07-22 MED ORDER — SALINE SPRAY 0.65 % NA SOLN
1.0000 | NASAL | Status: DC | PRN
  Administered 2024-07-22: 1 via NASAL
  Filled 2024-07-22: qty 44

## 2024-07-22 MED ORDER — TORSEMIDE 20 MG PO TABS
40.0000 mg | ORAL_TABLET | Freq: Every day | ORAL | Status: DC
  Administered 2024-07-23 – 2024-07-24 (×2): 40 mg via ORAL
  Filled 2024-07-22 (×4): qty 2

## 2024-07-22 NOTE — Progress Notes (Addendum)
 "  [facility] Daily Progress Note   Patient: [name] [name]  MRN: [id number]  DOB: Mar 15, 1956  DOA: 07/18/2024  DOS: the patient was seen and examined on @TODAY @    Patient identified themself as [name] [name]  DOB 08/14/1955  Medic Barnie Bud present in the home during encounter and performed the assessment and physical exam. Patient was seen today via video conference; my physical location [place], [place]    Brief hospital course:  CC: SOB HPI: [name] [name] is a 69 y.o. female with medical history significant of hypertension, hyperlipidemia, T2DM, diabetic polyneuropathy, prolonged QTc, anxiety, depression, GERD, hypothyroidism, COPD, chronic hypoxic respiratory failure on 4 LPM of oxygen , chronic tobacco abuse who presents emergency department due to respiratory distress.  She was seen at [facility] on 07/16/2024 for same symptoms.  COVID and flu test done with negative and she was treated at the ED and discharged home with prednisone .  She has only taken 1 dose since that time. Shortness of breath worsened while going to the bathroom at home today and EMS was activated.  On arrival of EMS team, breathing treatment with albuterol  and Atrovent  were provided.  Solu-Medrol  125 mg x 1, magnesium  and epi were given. Patient was recently admitted from 12/4 to 12/9 due to acute on chronic hypoxic respiratory failure due to COPD expiration and acute bronchitis and was treated with IV steroids, azithromycin , nebulizer treatments.     ED course In the emergency department, temperature was 37.2 F, BP 152/48, O2 sat was 100% on BiPAP with FiO2 50%.  Workup in the ED showed normocytic anemia and leukocytosis. Chest x-ray showed streaky atelectasis or scarring at the left base. TRH was asked to admit patient.  Significant Events: Admitted 07/18/2024 for COPD exacerbation, acute on chronic hypoxic and hypercapnic respiratory failure 07-19-2024 RVP POSITIVE for Rhino/Enterovirus 07-21-2023 pt  consented and transferred to [facility] program  Admission Labs: VBG pH 7.40, PCO2 of 56 Lactic acid 1.5 WBC 15.7, HgB 11.3 Plt 393 proBNP 214 Na 139, K 3.6, CO2 of 34, BUN 26, Scr 0.85, glu 216 Covid/Flu/RSV negative Mg 2.3  Admission Imaging Studies: CXR No active cardiopulmonary disease.   Significant Labs: Mg 1.6 (07/21/24) CO2 34 >> 30 (07/21/24) proBNP 349.0 (07/21/24) WBC trend 18.2 >> 13.4 >> 13.7 today Hbg trend 11.8 >> 10.6 >> 12.4 today  Significant Imaging Studies:      Assessment and Plan:  * Acute on chronic respiratory failure with hypoxia and hypercapnia (HCC) 07/22/2024 due to COPD exacerbation from +Rhinovirus infection. Pt not requiring Bipiap. Chronic O2 requirement of 4 L/min at home (5-6 L/min with exertion).  Remains stable on 4 L/min at rest. --Continue with home O2.  --Titrate to keep O2 sats >=90%   Chronic hypoxic respiratory failure (HCC) 07/22/2024 chronically on 4 L/min O2 at home, per daughter turns up to 5-6 L/min with activity.  Acutely exacerbated by viral infection and COPD exacerbation. Keep O2 sats >= 90%   COPD with acute exacerbation (HCC) 07/22/2024  --Continue with TID Duonebs --Q6h PRN nebs.  --Continue with IV Solumedrol 40 mg today --Transition to PO Prednisone  tomorrow AM and taper  --Given viral infection on top of her severe COPD, she may take a few more days of IV steroids to calm her acute inflammatory process down.   Rhinovirus infection 07/22/2024  --continue supportive care.  --On po doxycyline to prevent bacterial infection of her severe COPD.   TOBACCO ABUSE 07/22/2024 pt advised multiple times  during [facility] consenting process that she is not allowed to smoke while under the care of Hospital at Home. Dtr [name] [name] will go to pt's home today and get rid of all tobacco materials, cigarettes, lighters, etc.  --Continue with nicotine  patches --Added nicorette  gum PRN   Hypomagnesemia 07/21/24 - Mg 1.6 was  replaced 07/22/24 - mg improved to 2.0 --Monitor & replace Mg PRN  (HFpEF) heart failure with preserved ejection fraction (HCC) 07/22/2024 chronic. Not exacerbated.  Alkalosis improved CO2 34>>30 >>31 with Demadex  on hold (hypercapnia +/- contraction).  proBNP 349.0 (normal for age). --Resume Demadex  tomorrow AM --Monitor volume status closely --BMP in AM   Advanced directives, counseling/discussion 07/22/2024 pt was DNR/DNI prior to admission. Pt changed herself back to FULL CODE. Dtr was to talk to outpatient palliative care about GOC and code status. Dtr thinks pt should be DNR.     Non-insulin  dependent type 2 diabetes mellitus (HCC) 07/22/2024 continue with jardiance . Monitor CBG as needed.restart metformin .   Long-term current use of benzodiazepine 07/22/2024 continue klonopin  0.5 mg bid prn. anxiety   CKD (chronic kidney disease) stage 2, GFR 60-89 ml/min 07/22/2024 stable. Home demadex  has been on hold, resuming tomorrow. --Monitor BMP   Major depressive disorder, recurrent, in full remission with anxious distress 07/22/2024   GAD (generalized anxiety disorder) 07/22/2024 continue with buspar  and prn klonopin .   Hyperlipidemia 07/22/2024 continue with zetia  and lipitor .   Essential hypertension 07/22/2024 - BP 152/70 mildly elevated. --Continue Bystolic   --resume Demadex  tomorrow AM   Chronic depression 07/22/2024 continue lexapro  and buspar    Acquired hypothyroidism 07/22/2024 continue synthroid  112 mcg daily.   Obesity, Class II, BMI 35-39.9 07/22/2024 Body mass index is 32.5 kg/m.       Patient Active Problem List   Diagnosis Date Noted   Acute on chronic respiratory failure with hypoxia and hypercapnia (HCC) 10/05/2023    Priority: High   Chronic hypoxic respiratory failure (HCC) 09/01/2023    Priority: High   Rhinovirus infection 06/20/2023    Priority: High   COPD with acute exacerbation (HCC) 09/15/2009    Priority: High   TOBACCO ABUSE 07/17/2009     Priority: High   Hypomagnesemia 07/21/2024    Priority: Medium    (HFpEF) heart failure with preserved ejection fraction (HCC) 06/18/2024    Priority: Medium    Advanced directives, counseling/discussion 09/11/2015    Priority: Medium    Non-insulin  dependent type 2 diabetes mellitus (HCC) 06/18/2024    Priority: Low   Long-term current use of benzodiazepine 11/17/2023    Priority: Low   CKD (chronic kidney disease) stage 2, GFR 60-89 ml/min 11/12/2023    Priority: Low   Major depressive disorder, recurrent, in full remission with anxious distress 05/31/2021    Priority: Low   GAD (generalized anxiety disorder) 01/08/2021    Priority: Low   Essential hypertension 06/07/2013    Priority: Low   Hyperlipidemia 06/07/2013    Priority: Low   Acquired hypothyroidism 12/22/2008    Priority: Low   Chronic depression 12/22/2008    Priority: Low   Obesity, Class II, BMI 35-39.9 07/20/2024   History of depression 06/18/2024   Diabetic neuropathy (HCC) 06/18/2024   Acute on chronic heart failure with preserved ejection fraction (HFpEF, >= 50%) (HCC) 12/05/2023   At risk for polypharmacy 12/04/2023   Long-term current use of antidepressant 11/17/2023   Continuous dependence on cigarette smoking 09/01/2023   History of CAD (coronary artery disease) 09/01/2023   Full code status 06/14/2023  Nicotine  dependence 05/03/2023   Iron  deficiency anemia 11/02/2021   Chronic neck pain 02/26/2021   Esophageal dysphagia 04/28/2019   Bipolar disorder (HCC) 09/17/2018   Insomnia 10/28/2017   Prediabetes 05/28/2017   Psoriasis 06/22/2015   COPD (chronic obstructive pulmonary disease) (HCC)GOLD E 05/18/2015   Anxiety and depression 07/10/2013   Asthma, chronic 06/07/2013   Emphysema lung (HCC) 06/07/2013   Chronic back pain 06/07/2013   CAD S/P percutaneous coronary angioplasty 06/07/2013   GERD (gastroesophageal reflux disease) 06/07/2013   Breast lump on left side at 3 o'clock position  10/13/2012   Chronic rhinitis 07/17/2009   Lung nodule < 6cm on CT 07/17/2009       Subjective / Interval 24 hour History:   Patient seen virtually during a.m. medic visit to the home today.  She reports feeling a little better today.  Her breathing feels about 50% back to her normal baseline.  She has been using her normal 4 L of oxygen  but turning it up with any exertion.  She has not been moving around very much, mostly staying in bed.  She reports feeling generally weak.  We discussed importance of mobility to maintain strength and prevent progressive weakness.  She expressed understanding.  Denies any fevers or chills.  States appetite is not great right now but is drinking plenty of fluids.  Her glucose was noted to be's in the 70s this morning and she was given juice to drink by our medic at this morning's visit.  Encouraged patient to at least eat small snacks if not having normal meals.  Patient denies other acute complaints today.       Antibiotic Therapy: Anti-infectives (From admission, onward)    Start     Dose/Rate Route Frequency Ordered Stop   07/19/24 1545  doxycycline  (VIBRA -TABS) tablet 100 mg        100 mg Oral Every 12 hours 07/19/24 1531         Procedures: None  Consultants: None          Physical Exam:    07/22/2024   10:00 AM 07/21/2024    5:37 PM 07/21/2024    8:00 AM  Vitals with BMI  Systolic 152 161 839  Diastolic 70 76 80  Pulse 72 69 72    Bedside physical exam was performed by Medic listed above. Below exam findings are based on their in person physical exam findings and my observations during virtual encounter.   General exam: awake, alert, no acute distress HEENT: Voice clear, hearing grossly normal  Respiratory system: Expiratory and inspiratory wheezes diffusely in all lung fields, congested and productive sounding cough, normal respiratory effort at rest, on 4 L/min nasal cannula O2 Cardiovascular system: normal S1/S2,  RRR Gastrointestinal system: soft, active bowel sounds Central nervous system: A&O x 4. no gross focal neurologic deficits, normal speech Psychiatry: normal mood, congruent affect, judgement and insight appear normal    Data Reviewed: As reviewed above   Family Communication: 2nd video visit with patient's daughter this afternoon/early evening  Disposition: Status is: Inpatient Remains inpatient appropriate because: Remains on IV steroids pending further clinical improvement of COPD exacerbation in the setting of rhinovirus infection.   Planned Discharge Destination: Home    Time spent: 45 minutes  Author: Burnard DELENA Cunning, DO [facility]  03/04/2024 7:00 AM  For on call review [email or web address].   "

## 2024-07-22 NOTE — Progress Notes (Signed)
 The pt was seen for her evening hospital at home visit. The pt was alert and oriented x4. Her skin was warm,dry and normal In color. She had a strong radial pulse and was breathing normally.   Vitals were taken and are as noted.Lung sounds were ausculted and were diminished in the lower lobes and had inspiratory and expiratory wheezing. The pt stated that she has spent all day in bed and but has been feeling better. The pt was given the IV magnesium  without any complications. The pt's daughter arrived to the home and feed her mom dinner.  The pt stated she still had not had a BM.    At appropriately 2200 the pt  was seen once more and assisted with her nighttime medications with help form the virtual nurse.  The pt was told to call if she began to feel short of breath during the night and she agreed to same.

## 2024-07-22 NOTE — Telephone Encounter (Signed)
 HF Paramedicine Team Based Care Meeting    [place] [facility] Paramedicine  Izetta Leodis Powell Jacques [facility] admit within the last 30 days- YES- currently admitted under hospital at home for COPD   Medications concerns- none prior to admission- daughter does meds- meds are delivered to daughters house and she distributes them to [place]. Meds have been confirmed by Dede.   Transportation issues- No- she doesn't drive, daughter takes her to appointments or she takes the bus.   Education needs- None at present until she is d/c'ed from [facility]. At Home   SDOH- NONE   Eligible for discharge- Not at present will follow up after d/c   Powell Jacques, EMT-Paramedic [phone] 07/22/2024

## 2024-07-22 NOTE — Progress Notes (Signed)
 Prior to medic visit, patient did experience a nose bleed from right nostril that had subsided by the time of medic arrival. RN informed patient that the oxygen  could be drying out her nose. PRN nasal spray was ordered per prn standing orders and patient was informed that we would bring it to her on the morning visit.

## 2024-07-22 NOTE — Progress Notes (Signed)
 Patient reports having a nose bleed overnight. A humidifier will be delivered to her house later today. Denis pain and SOB. SpO2 95% on 4L at rest. Plan for virtual PT visit at 1300 today.

## 2024-07-22 NOTE — Progress Notes (Signed)
 Patient's daughter, Doran expressed concerns how the patient's health is not back to her baseline and does not feel comfortable with pt being discharge tomorrow. Virtual visit with Tiffiney and Dr. Fausto set-up for this evening.

## 2024-07-22 NOTE — Plan of Care (Signed)

## 2024-07-22 NOTE — Progress Notes (Signed)
 2019--Inbound call received from patients daughter who reported that patient is almost legally blind and requires onsite support with medications when possible. Emergency contact/daughter informed patient has an EMT/RN onsite and virtual visit to support.   2040--Patient contact via video chat. Patient alert and oriented, contact call via video completed, patient alert and oriented lying in bed. Patient denied pain, all questions answered. Patient informed EMTs estimated arrival for additional medication administration assistance with RN oversight. Patient able to self-administer nebulizer solutions.    2112-2130  -Video call completed , EMT at the home with patient for support. Patient medication self- administered /with [employer] per [facility] protocol. Onsite [facility] EMT at home for assistance. Patient confirmed all questions answered and needs met. Overnight monitoring plan reviewed again. Patient encouraged to contact [facility] RN with additional needs.

## 2024-07-22 NOTE — Progress Notes (Signed)
" °   07/22/24 0731  TOC Barriers to Discharge  Barriers to Discharge Continued Medical Work up (IV abx, labs)    "

## 2024-07-22 NOTE — Progress Notes (Addendum)
 Arrived to patient   EMT was drawing blood upon my arrival    she is alert and oriented   she states she hasn't been up long   she states she is normally SOB when she wakes up   she states weakness    she states she hasn't been moving around much  vitals are stable   wheezes thru out all fields   half way thru visit she wasn't as SOB   I warmed up her some breakfast to eat   she ate a small amount   she is encouraged to move around  and to use her walker when doing so and to eat more today  she agreed to try harder today   all other documentation are in the flow charts

## 2024-07-22 NOTE — Progress Notes (Signed)
 Physical Therapy Treatment Patient Details Name: [name] [name] MRN: [id number] DOB: June 26, 1956 Today's Date: 07/22/2024   History of Present Illness 69 y.o. F adm 1/4 respiratory distress PMH HTN, CAD with multiple DES, DM2, COPD on 4L O2, anxiety, hypothyroidism, RTC repair 12/24/2021, ongoing tobacco abuse. Recently admitted from 12/4 to 12/9 due to acute on chronic hypoxic respiratory failure due to COPD expiration and acute bronchitis Smoker    PT Comments  Virtual follow-up completed today with HEP reviewed and provided. Pt states she is mobilizing grossly at baseline ability however still becomes very short of breath on 4L supplemental O2 and is limited, only tolerating walking room to room unassisted. I recommended using her rollator for efficiency of gait, energy conservation, and safety with mobility. Discussed symptom and RPE awareness, encouraged performing HEP 3x/day. If pt gets too fatigued to complete short walks several times per day, then to work on standing tolerance at a minimum. Feel HHPT would be most appropriate given low functional capacity but should work up to pulmonary rehab visits when she is capable of safely transferring to a clinic with family support. She has no further mobility questions at this time. Aware we are available for follow-up with any acute rehab needs. Will follow until d/c. Patient will continue to benefit from skilled physical therapy services to further improve independence with functional mobility.          Equipment Recommendations  None recommended by PT       Precautions / Restrictions Precautions Precautions: Fall Recall of Precautions/Restrictions: Impaired Precaution/Restrictions Comments: pt reports fall  with Rt ankle injury. This was several months ago and states symptoms have resolved. Restrictions Weight Bearing Restrictions Per Provider Order: No     Mobility  Bed Mobility                    Transfers                         Ambulation/Gait                   Stairs             Wheelchair Mobility     Tilt Bed    Modified Rankin (Stroke Patients Only)       Balance                                            Communication Communication Communication: No apparent difficulties  Cognition Arousal: Alert Behavior During Therapy: WFL for tasks assessed/performed   PT - Cognitive impairments: No apparent impairments                         Following commands: Intact      Cueing Cueing Techniques: Verbal cues  Exercises Other Exercises Other Exercises: Educated and performed with pt: HEP provided to [facility]  team for delivery. Access Code: [email or web address]  URL: [email or web address]  Date: 07/22/2024  Prepared by: Leontine Roads    Exercises  - Seated Ankle Pumps  - 3 x daily - 7 x weekly - 1 sets - 10 reps  - Seated Long Arc Quad  - 3 x daily - 7 x weekly - 1 sets - 10 reps  - Seated Gluteal Sets  - 3 x daily - 7 x weekly - 1 sets -  10 reps    General Comments General comments (skin integrity, edema, etc.): Met virtually via zoom; patient sitting on edge of bed, reporting she has just independently walked from her living room to bed room for session with PT follow-up. She complains of SOB, dyspneic 2/3. Educated on symptom awareness, modified RPE scale, energy conservation techniques and recommended using rollator for gait efficency. RN present on virtual call, unable to obtain pleth.      Pertinent Vitals/Pain Pain Assessment Pain Assessment: No/denies pain    Home Living                          Prior Function            PT Goals (current goals can now be found in the care plan section) Acute Rehab PT Goals Patient Stated Goal: go home PT Goal Formulation: With patient Time For Goal Achievement: 08/03/24 Potential to Achieve Goals: Fair Progress towards PT goals: Progressing toward goals    Frequency    Min  1X/week      PT Plan      Co-evaluation              AM-PAC PT 6 Clicks Mobility   Outcome Measure  Help needed turning from your back to your side while in a flat bed without using bedrails?: None Help needed moving from lying on your back to sitting on the side of a flat bed without using bedrails?: None Help needed moving to and from a bed to a chair (including a wheelchair)?: None Help needed standing up from a chair using your arms (e.g., wheelchair or bedside chair)?: None Help needed to walk in hospital room?: None Help needed climbing 3-5 steps with a railing? : A Lot 6 Click Score: 22    End of Session Equipment Utilized During Treatment: Oxygen  Activity Tolerance: Other (comment);Patient tolerated treatment well (Virtual visit, pt completed seated EOB) Patient left:  (Sitting EOB at home) Nurse Communication:  Scientist, Water Quality RN present during call) PT Visit Diagnosis: Unsteadiness on feet (R26.81);Muscle weakness (generalized) (M62.81);Pain     Time: 8688-8677 PT Time Calculation (min) (ACUTE ONLY): 11 min  Charges:    $Therapeutic Exercise: 8-22 mins PT General Charges $$ ACUTE PT VISIT: 1 Visit                     Leontine Roads, PT, DPT [facility]  Rehabilitation Services Physical Therapist Office: [phone] Website: [email or web address]    Leontine GORMAN Roads 07/22/2024, 2:16 PM

## 2024-07-22 NOTE — Progress Notes (Signed)
 Arrived to patient  she is in her bed   she stated that she got and walked to the kitchen and got SOB   she said it really scared her   her daughter stated that she came in shortly after that and it took her a while for her to get over it   her vitals were stable   bgs was slightly high   meds were given   humidifier was attached to the concentrator    IMM signed and dated at 5pm   daughter asked to speak with Dr Fausto with some concerns   called Shanda and she said she would get them connected

## 2024-07-23 LAB — BASIC METABOLIC PANEL WITH GFR
Anion gap: 8 (ref 5–15)
BUN: 20 mg/dL (ref 8–23)
CO2: 29 mmol/L (ref 22–32)
Calcium: 9 mg/dL (ref 8.9–10.3)
Chloride: 105 mmol/L (ref 98–111)
Creatinine, Ser: 0.78 mg/dL (ref 0.44–1.00)
GFR, Estimated: >60 mL/min
Glucose, Bld: 79 mg/dL (ref 70–99)
Potassium: 4.9 mmol/L (ref 3.5–5.1)
Sodium: 142 mmol/L (ref 135–145)

## 2024-07-23 LAB — BLOOD GAS, VENOUS
Acid-Base Excess: 6.2 mmol/L — ABNORMAL HIGH (ref 0.0–2.0)
Bicarbonate: 32.7 mmol/L — ABNORMAL HIGH (ref 20.0–28.0)
Drawn by: 75889
O2 Saturation: 86 %
Patient temperature: 37.2
pCO2, Ven: 54 mmHg (ref 44–60)
pH, Ven: 7.39 (ref 7.25–7.43)
pO2, Ven: 53 mmHg — ABNORMAL HIGH (ref 32–45)

## 2024-07-23 LAB — CBC
HCT: 37.7 % (ref 36.0–46.0)
Hemoglobin: 11.6 g/dL — ABNORMAL LOW (ref 12.0–15.0)
MCH: 28.5 pg (ref 26.0–34.0)
MCHC: 30.8 g/dL (ref 30.0–36.0)
MCV: 92.6 fL (ref 80.0–100.0)
Platelets: 519 K/uL — ABNORMAL HIGH (ref 150–400)
RBC: 4.07 MIL/uL (ref 3.87–5.11)
RDW: 16 % — ABNORMAL HIGH (ref 11.5–15.5)
WBC: 14.3 K/uL — ABNORMAL HIGH (ref 4.0–10.5)
nRBC: 0 % (ref 0.0–0.2)

## 2024-07-23 LAB — MAGNESIUM: Magnesium: 1.8 mg/dL (ref 1.7–2.4)

## 2024-07-23 LAB — GLUCOSE, CAPILLARY
Glucose-Capillary: 125 mg/dL — ABNORMAL HIGH (ref 70–99)
Glucose-Capillary: 297 mg/dL — ABNORMAL HIGH (ref 70–99)

## 2024-07-23 NOTE — Progress Notes (Signed)
 OT Cancellation Note  Patient Details Name: [name] [name] MRN: [id number] DOB: 17-Nov-1955   Cancelled Treatment:    Reason Eval/Treat Not Completed: Other (comment) (Virtual visit canceled, OT to plan to follow up with the patient once she is back at [facility].)  Lucie JONETTA Kendall 07/23/2024, 10:39 AM

## 2024-07-23 NOTE — Progress Notes (Signed)
 "  Hospital at Home Daily Progress Note   Patient: [name] [name]  MRN: [id number]  DOB: 05-31-1956  DOA: 07/18/2024  DOS: the patient was seen and examined on @TODAY @    Patient identified themself as [name] [name]  DOB 01/07/1956  Medic Barnie Bud present in the home during encounter and performed the assessment and physical exam. Patient was seen today via video conference; my physical location [place], [place]    Brief hospital course:  CC: SOB HPI: SHANIGUA GIBB is a 69 y.o. female with medical history significant of hypertension, hyperlipidemia, T2DM, diabetic polyneuropathy, prolonged QTc, anxiety, depression, GERD, hypothyroidism, COPD, chronic hypoxic respiratory failure on 4 LPM of oxygen , chronic tobacco abuse who presents emergency department due to respiratory distress.  She was seen at [facility] on 07/16/2024 for same symptoms.  COVID and flu test done with negative and she was treated at the ED and discharged home with prednisone .  She has only taken 1 dose since that time. Shortness of breath worsened while going to the bathroom at home today and EMS was activated.  On arrival of EMS team, breathing treatment with albuterol  and Atrovent  were provided.  Solu-Medrol  125 mg x 1, magnesium  and epi were given. Patient was recently admitted from 12/4 to 12/9 due to acute on chronic hypoxic respiratory failure due to COPD expiration and acute bronchitis and was treated with IV steroids, azithromycin , nebulizer treatments.     ED course In the emergency department, temperature was 37.2 F, BP 152/48, O2 sat was 100% on BiPAP with FiO2 50%.  Workup in the ED showed normocytic anemia and leukocytosis. Chest x-ray showed streaky atelectasis or scarring at the left base. TRH was asked to admit patient.  Significant Events: Admitted 07/18/2024 for COPD exacerbation, acute on chronic hypoxic and hypercapnic respiratory failure 07-19-2024 RVP POSITIVE for Rhino/Enterovirus 07-20-2024 pt  consented and transferred to [facility] program 07-23-2024 due to pt's increased O2 need, profound weakness & unsafe functional status (legally blind and unable to take meds without in-person assistance), patient was transferred back to [facility] hospital today.  Admission Labs: VBG pH 7.40, PCO2 of 56 Lactic acid 1.5 WBC 15.7, HgB 11.3 Plt 393 proBNP 214 Na 139, K 3.6, CO2 of 34, BUN 26, Scr 0.85, glu 216 Covid/Flu/RSV negative Mg 2.3  Admission Imaging Studies: CXR No active cardiopulmonary disease.   Significant Labs: Mg 1.6 (07/21/24) CO2 34 >> 30 (07/21/24) proBNP 349.0 (07/21/24) WBC trend 18.2 >> 13.4 >> 13.7 >> 14.3 today Hbg trend 11.8 >> 10.6 >> 12.4 >> 11.6today  Significant Imaging Studies:      Assessment and Plan:  * Acute on chronic respiratory failure with hypoxia and hypercapnia (HCC) 07/23/2024 due to COPD exacerbation from +Rhinovirus infection. Pt not requiring Bipiap. Chronic O2 requirement of 4 L/min at home (5-6 L/min with exertion).  O2 at rest turned up to 5 L/min due to work of breathing and borderline O2 sats --Continue with home O2.  --Titrate to keep O2 sats >=90%   Chronic hypoxic respiratory failure (HCC) 07/23/2024 chronically on 4 L/min O2 at home, per daughter turns up to 5-6 L/min with activity.  Acutely exacerbated by viral infection and COPD exacerbation. Keep O2 sats >= 90%   COPD with acute exacerbation (HCC) 07/23/2024  --Continue with TID Duonebs --Q6h PRN nebs.  --Continue with IV Solumedrol 40 mg today --Transition to PO Prednisone  when further clinical improvement, and would do a long taper  --Given viral infection on top  of her severe COPD, she may take a few more days of IV steroids to calm her acute inflammatory process down. --She has previously scheduled Pulmonology follow up upcoming   Rhinovirus infection 07/23/2024  --continue supportive care.  --On po doxycyline to prevent bacterial infection of her severe  COPD.   TOBACCO ABUSE Pt advised multiple times during Hospital at Home consenting process that she is not allowed to smoke while under the care of Hospital at Home. Dtr removed all tobacco materials, cigarettes, lighters, etc. From home --Continue with nicotine  patches --Added nicorette  gum PRN   Hypomagnesemia 07/21/24 - Mg 1.6 was replaced 07/23/24 - mg to 1.8 --Monitor & replace Mg PRN  (HFpEF) heart failure with preserved ejection fraction (HCC) Chronic. Not exacerbated.  Alkalosis improved CO2 34>>30 >>31 with Demadex  on hold (hypercapnia +/- contraction).  proBNP 349.0 (normal for age). 07/23/2024  --Demadex  resumed today --Monitor volume status closely --BMP in AM   Advanced directives, counseling/discussion 07/23/2024 pt was DNR/DNI prior to admission. Pt changed herself back to FULL CODE. Dtr was to talk to outpatient palliative care about GOC and code status. Dtr thinks pt should be DNR.     Non-insulin  dependent type 2 diabetes mellitus (HCC) 07/23/2024 continue with jardiance . Monitor CBG as needed.restart metformin .   Long-term current use of benzodiazepine 07/23/2024 continue klonopin  0.5 mg bid prn. anxiety   CKD (chronic kidney disease) stage 2, GFR 60-89 ml/min 07/23/2024 stable. Home demadex  has been on hold, resuming tomorrow. --Monitor BMP   Major depressive disorder, recurrent, in full remission with anxious distress 07/23/2024   GAD (generalized anxiety disorder) 07/23/2024 continue with buspar  and prn klonopin .   Hyperlipidemia 07/23/2024 continue with zetia  and lipitor .   Essential hypertension 07/23/2024 - BP 169/76 this AM, later improved 116/85. --Continue Bystolic   --resume Demadex  tomorrow AM   Chronic depression 07/23/2024 continue lexapro  and buspar    Acquired hypothyroidism 07/23/2024 continue synthroid  112 mcg daily.   Obesity, Class II, BMI 35-39.9 07/23/2024 Body mass index is 32.5 kg/m.       Patient Active Problem List   Diagnosis  Date Noted   Acute on chronic respiratory failure with hypoxia and hypercapnia (HCC) 10/05/2023    Priority: High   Chronic hypoxic respiratory failure (HCC) 09/01/2023    Priority: High   Rhinovirus infection 06/20/2023    Priority: High   COPD with acute exacerbation (HCC) 09/15/2009    Priority: High   TOBACCO ABUSE 07/17/2009    Priority: High   Hypomagnesemia 07/21/2024    Priority: Medium    (HFpEF) heart failure with preserved ejection fraction (HCC) 06/18/2024    Priority: Medium    Advanced directives, counseling/discussion 09/11/2015    Priority: Medium    Non-insulin  dependent type 2 diabetes mellitus (HCC) 06/18/2024    Priority: Low   Long-term current use of benzodiazepine 11/17/2023    Priority: Low   CKD (chronic kidney disease) stage 2, GFR 60-89 ml/min 11/12/2023    Priority: Low   Major depressive disorder, recurrent, in full remission with anxious distress 05/31/2021    Priority: Low   GAD (generalized anxiety disorder) 01/08/2021    Priority: Low   Essential hypertension 06/07/2013    Priority: Low   Hyperlipidemia 06/07/2013    Priority: Low   Acquired hypothyroidism 12/22/2008    Priority: Low   Chronic depression 12/22/2008    Priority: Low   Obesity, Class II, BMI 35-39.9 07/20/2024   History of depression 06/18/2024   Diabetic neuropathy (HCC) 06/18/2024   Acute  on chronic heart failure with preserved ejection fraction (HFpEF, >= 50%) (HCC) 12/05/2023   At risk for polypharmacy 12/04/2023   Long-term current use of antidepressant 11/17/2023   Continuous dependence on cigarette smoking 09/01/2023   History of CAD (coronary artery disease) 09/01/2023   Full code status 06/14/2023   Nicotine  dependence 05/03/2023   Iron  deficiency anemia 11/02/2021   Chronic neck pain 02/26/2021   Esophageal dysphagia 04/28/2019   Bipolar disorder (HCC) 09/17/2018   Insomnia 10/28/2017   Prediabetes 05/28/2017   Psoriasis 06/22/2015   COPD (chronic  obstructive pulmonary disease) (HCC)GOLD E 05/18/2015   Anxiety and depression 07/10/2013   Asthma, chronic 06/07/2013   Emphysema lung (HCC) 06/07/2013   Chronic back pain 06/07/2013   CAD S/P percutaneous coronary angioplasty 06/07/2013   GERD (gastroesophageal reflux disease) 06/07/2013   Breast lump on left side at 3 o'clock position 10/13/2012   Chronic rhinitis 07/17/2009   Lung nodule < 6cm on CT 07/17/2009       Subjective / Interval 24 hour History:   Patient seen virtually during a.m. medic visit to the home today.  She reports she is hanging in there, really still feels unwell, weak.  Coughing more today than past couple of days, intermittently productive of white phlegm.  No fever/chills.  We discussed her increased O2 needs and concern about this, and patient is agreeable to transfer back to the hospital to be safe.  I called her daughter by phone and she was also in agreement.  Patient denies other acute complaints today.       Antibiotic Therapy: Anti-infectives (From admission, onward)    Start     Dose/Rate Route Frequency Ordered Stop   07/19/24 1545  doxycycline  (VIBRA -TABS) tablet 100 mg        100 mg Oral Every 12 hours 07/19/24 1531 07/24/24 0959       Procedures: None  Consultants: None          Physical Exam:    07/23/2024    1:05 PM 07/23/2024    9:00 AM 07/23/2024    8:00 AM  Vitals with BMI  Systolic 116 169   Diastolic 85 76   Pulse 78 78 72    Bedside physical exam was performed by Medic listed above. Below exam findings are based on their in person physical exam findings and my observations during virtual encounter.  General exam: awake, alert, no acute distress HEENT: Voice clear, hearing grossly normal  Respiratory system: Expiratory and inspiratory wheezes diffusely in all lung fields, congested and productive sounding cough, increased respiratory effort at rest, on 5 L/min nasal cannula O2 Cardiovascular system: normal S1/S2,  no peripheral edema Gastrointestinal system: soft, active bowel sounds Central nervous system: A&O x 4. no gross focal neurologic deficits, normal speech Psychiatry: normal mood, congruent affect, judgement and insight appear normal    Data Reviewed: As reviewed above   Family Communication: Daughter updated by phone this morning.   Disposition: Status is: Inpatient Remains inpatient appropriate because: Remains on IV steroids pending further clinical improvement of COPD exacerbation in the setting of rhinovirus infection.   Planned Discharge Destination: Home    Time spent: 45 minutes  Author: Burnard DELENA Cunning, DO [facility]  03/04/2024 7:00 AM  For on call review [email or web address].   "

## 2024-07-23 NOTE — Progress Notes (Signed)
 This EMT went to the pt's home to bring her back to [facility] ROOM 2W19. Upon arrival the pt greeted this EMT at the door and was very short of breath. The pt was asked to sit down while I packed all of the H@H  equipment. All equipment was packed and loaded into the WC van. I then assisted the pt in packing a bag for the hospital. Her belongings were then placed in the [place]. I then assisted the pt to the [facility] for transport. She was then loaded into the [place] and secured via floor locking system and safety straps. The pt is on 5LPM of 02 VIA [place]. She remained on 02 for the duration of transport. Upon arrival to [facility] she was was removed from the [place] and taken to her new room 2W19. Once in the room the pt was assisted to the bed. She was then placed on 5LPM of 02 in the room. The floor RN Zorita was made aware that the pt had arrived. At this time pt care report was verbalized to the RN Tammi. Pt care was transferred to floor staff at this time.

## 2024-07-23 NOTE — Progress Notes (Signed)
 Informed floor nurse Zorita Molt of pt 's ETA of 20-30 minutes

## 2024-07-23 NOTE — Progress Notes (Signed)
 Completed virtual rounds with MD,paramedic at patient bedside. POC reviewed and discussed ,patient voices understanding and agreement. Pt reminded to call RN for any needs, RN and MD available at all times. Pt voices understanding.   Decision made to escalate back to the hospital.Dr.Griffith to call patients daughter to explain. Awaiting bed assignment. Pt aware to call if she needs anything. VSS

## 2024-07-23 NOTE — Progress Notes (Signed)
 Spoke with patient via phone,(she didn't answer the ipad). She is alert and oriented. States she is not having any distress. Informed her of am visit from paramedic. VSS in current health.

## 2024-07-23 NOTE — Progress Notes (Addendum)
 Arrived to patient   she was in her bed asleep   she was easily awaken   she looks very tired   she is alert and oriented   all vitals were taken and put into the flowchart   all meds were given  all flow chart areas were completed   she is advised that she may be taken back to the hospital and she agrees with that possible move  she said she is by herself and that her daughter can't be there all the time and that sometimes it does scare her when she has to get up out of bed by herself since yesterday she became very SOB when doing so   patient spoke with Dr Fausto and the decision was made to bring her back to the hospital later this morning  blood work was collected prior to leaving residence   she is advised to call back if needed before we return to pick her up   her meds for today were never taken into the house   they will be brought back to the hub

## 2024-07-23 NOTE — Plan of Care (Signed)

## 2024-07-23 NOTE — TOC Progression Note (Signed)
 Transition of Care [facility]) - Progression Note    Patient Details  Name: [name] [name] MRN: [id number] Date of Birth: 1955-08-04  [facility] CM/SW Contact  Debarah Saunas, RN Phone Number: 07/23/2024, 8:20 AM  Clinical Narrative:    Plan for IV steroids today; pt may return to hospital vs d/c AMA due to safety concerns in home (pt reporting legally blind, home alone, unable to see/identify Rx)     Barriers to Discharge: Continued Medical Work up (IV steroids)               Expected Discharge Plan and Services    Home with [facility] vs Palliative                                           Social Drivers of Health (SDOH) Interventions SDOH Screenings   Food Insecurity: No Food Insecurity (07/19/2024)  Recent Concern: Food Insecurity - Food Insecurity Present (06/18/2024)  Housing: Low Risk (07/19/2024)  Transportation Needs: No Transportation Needs (07/19/2024)  Utilities: Not At Risk (07/19/2024)  Alcohol Screen: Low Risk (06/18/2024)  Depression (PHQ2-9): Low Risk (07/02/2024)  Recent Concern: Depression (PHQ2-9) - Medium Risk (05/18/2024)  Financial Resource Strain: Medium Risk (06/18/2024)  Physical Activity: Inactive (06/18/2024)  Social Connections: Moderately Isolated (07/19/2024)  Stress: No Stress Concern Present (06/18/2024)  Tobacco Use: High Risk (07/18/2024)  Health Literacy: Inadequate Health Literacy (08/07/2023)    Readmission Risk Interventions    06/20/2023    3:17 PM  Readmission Risk Prevention Plan  Transportation Screening Complete  HRI or Home Care Consult Complete  Social Work Consult for Recovery Care Planning/Counseling Complete  Palliative Care Screening Not Applicable  Medication Review [employer]) Complete

## 2024-07-23 NOTE — Plan of Care (Signed)
" °  Problem: Coping: Goal: Ability to adjust to condition or change in health will improve Outcome: Progressing   Problem: Fluid Volume: Goal: Ability to maintain a balanced intake and output will improve Outcome: Progressing   Problem: Nutritional: Goal: Maintenance of adequate nutrition will improve Outcome: Progressing   Problem: Skin Integrity: Goal: Risk for impaired skin integrity will decrease Outcome: Progressing   Problem: Tissue Perfusion: Goal: Adequacy of tissue perfusion will improve Outcome: Progressing   Problem: Education: Goal: Knowledge of General Education information will improve Description: Including pain rating scale, medication(s)/side effects and non-pharmacologic comfort measures Outcome: Progressing   Problem: Clinical Measurements: Goal: Will remain free from infection Outcome: Progressing   Problem: Clinical Measurements: Goal: Respiratory complications will improve Outcome: Progressing   Problem: Activity: Goal: Risk for activity intolerance will decrease Outcome: Progressing   Problem: Skin Integrity: Goal: Risk for impaired skin integrity will decrease Outcome: Progressing   "

## 2024-07-23 NOTE — Progress Notes (Addendum)
 0503-Inbound call received from Emergency Contact/Daughter, who reported patient called her and is confused and reported that all her things were gone. Daughter confirmed she is not with the patient to assist.   [phone] to reach patient via tablet and address alerts for help and Tachypnea for Respiratory Rate , and assess emergency contact/daughters concerns.    0513-Outbound call to emergency contact/daughter to inform patient is unable to be reached. Emergency contact/daughter confirmed and reported she could hear RN calling on the current health tablet due to the patient dropping it and being unable to reach it. Emergency contact/daughter reported the patient is now incontinent of bowels and needed support. Emergency contact/daughter reported and confirmed she is now going to the home to assist the patient and will call [facility] once she arrives.    0536--0600-Patient contact via video chat, patient lying in bed. Patient Alert to person, place, and DOB, unable to report year. Patient reported she felt like she woke up to someone stealing things out her home. Emergency contact/daughter, in the home with patient and reported and confirmed everything in the house is intact, does not appear to be anything missing or tampered with, and there are entry and exit cameras that were viewed and no one has entered the home.    Daughter took patients Temp.-97.5 (O), Pulse ox-99% 5L; daughter unable to find patients Blood sugar machine. Unable to obtain blood pressure. Patient denies pain or respiratory distress. The patient reported she had two episodes of diarrhea and went two times prior to calling daughter. Patient reported that she feels like her stomach is upset and nauseous. Patient had a snack and   had PRN medication for nausea. By the end of video call patient reported she must have been dreaming and confirmed she is feeling better. Patient and emergency contact/daughter encouraged to consume small  meals per diabetic protocol with medication administration.  Patient and emergency contact/daughter encouraged to contact [facility] as needed.

## 2024-07-24 DIAGNOSIS — J9622 Acute and chronic respiratory failure with hypercapnia: Secondary | ICD-10-CM | POA: Diagnosis not present

## 2024-07-24 DIAGNOSIS — J9621 Acute and chronic respiratory failure with hypoxia: Secondary | ICD-10-CM | POA: Diagnosis not present

## 2024-07-24 LAB — CBC
HCT: 37 % (ref 36.0–46.0)
Hemoglobin: 11.7 g/dL — ABNORMAL LOW (ref 12.0–15.0)
MCH: 28.3 pg (ref 26.0–34.0)
MCHC: 31.6 g/dL (ref 30.0–36.0)
MCV: 89.6 fL (ref 80.0–100.0)
Platelets: 552 K/uL — ABNORMAL HIGH (ref 150–400)
RBC: 4.13 MIL/uL (ref 3.87–5.11)
RDW: 16.1 % — ABNORMAL HIGH (ref 11.5–15.5)
WBC: 16.1 K/uL — ABNORMAL HIGH (ref 4.0–10.5)
nRBC: 0 % (ref 0.0–0.2)

## 2024-07-24 LAB — BASIC METABOLIC PANEL WITH GFR
Anion gap: 11 (ref 5–15)
BUN: 31 mg/dL — ABNORMAL HIGH (ref 8–23)
CO2: 31 mmol/L (ref 22–32)
Calcium: 9.6 mg/dL (ref 8.9–10.3)
Chloride: 98 mmol/L (ref 98–111)
Creatinine, Ser: 0.92 mg/dL (ref 0.44–1.00)
GFR, Estimated: >60 mL/min
Glucose, Bld: 170 mg/dL — ABNORMAL HIGH (ref 70–99)
Potassium: 3.4 mmol/L — ABNORMAL LOW (ref 3.5–5.1)
Sodium: 139 mmol/L (ref 135–145)

## 2024-07-24 LAB — GLUCOSE, CAPILLARY: Glucose-Capillary: 126 mg/dL — ABNORMAL HIGH (ref 70–99)

## 2024-07-24 LAB — VITAMIN D 25 HYDROXY (VIT D DEFICIENCY, FRACTURES): Vit D, 25-Hydroxy: 18.2 ng/mL — ABNORMAL LOW (ref 30–100)

## 2024-07-24 LAB — PHOSPHORUS: Phosphorus: 3.2 mg/dL (ref 2.5–4.6)

## 2024-07-24 LAB — MAGNESIUM: Magnesium: 1.5 mg/dL — ABNORMAL LOW (ref 1.7–2.4)

## 2024-07-24 LAB — VITAMIN B12: Vitamin B-12: 465 pg/mL (ref 180–914)

## 2024-07-24 LAB — FOLATE: Folate: 15.8 ng/mL

## 2024-07-24 MED ORDER — MAGNESIUM SULFATE 2 GM/50ML IV SOLN
2.0000 g | Freq: Once | INTRAVENOUS | Status: AC
  Administered 2024-07-24: 2 g via INTRAVENOUS
  Filled 2024-07-24: qty 50

## 2024-07-24 MED ORDER — OXYCODONE HCL 5 MG PO TABS: 5.0000 mg | ORAL_TABLET | Freq: Four times a day (QID) | ORAL | Status: DC | PRN

## 2024-07-24 MED ORDER — OXYCODONE HCL 5 MG PO TABS
5.0000 mg | ORAL_TABLET | Freq: Four times a day (QID) | ORAL | Status: DC | PRN
  Administered 2024-07-24: 5 mg via ORAL
  Filled 2024-07-24 (×2): qty 1

## 2024-07-24 MED ORDER — POTASSIUM CHLORIDE CRYS ER 20 MEQ PO TBCR
40.0000 meq | EXTENDED_RELEASE_TABLET | Freq: Once | ORAL | Status: AC
  Administered 2024-07-24: 40 meq via ORAL
  Filled 2024-07-24: qty 2

## 2024-07-24 MED ORDER — POTASSIUM CHLORIDE 20 MEQ PO PACK
40.0000 meq | PACK | Freq: Once | ORAL | Status: DC
  Filled 2024-07-24: qty 2

## 2024-07-24 MED ORDER — IPRATROPIUM-ALBUTEROL 0.5-2.5 (3) MG/3ML IN SOLN
3.0000 mL | Freq: Two times a day (BID) | RESPIRATORY_TRACT | Status: DC
  Administered 2024-07-25 – 2024-07-26 (×3): 3 mL via RESPIRATORY_TRACT
  Filled 2024-07-24 (×3): qty 3

## 2024-07-24 NOTE — Progress Notes (Signed)
 [facility] Progress Note  Patient: [name] [name]    FMW:[id number]  DOA: 07/18/2024     Date of Service: the patient was seen and examined on 07/24/2024  Chief Complaint  Patient presents with   Respiratory Distress   Brief hospital course: SOSHA SHEPHERD is a 69 y.o. female with medical history significant of hypertension, hyperlipidemia, T2DM, diabetic polyneuropathy, prolonged QTc, anxiety, depression, GERD, hypothyroidism, COPD, chronic hypoxic respiratory failure on 4 LPM of oxygen , chronic tobacco abuse who presents emergency department due to respiratory distress.  She was seen at [facility] on 07/16/2024 for same symptoms.  COVID and flu test done with negative and she was treated at the ED and discharged home with prednisone .  She has only taken 1 dose since that time. Shortness of breath worsened while going to the bathroom at home today and EMS was activated.  On arrival of EMS team, breathing treatment with albuterol  and Atrovent  were provided.  Solu-Medrol  125 mg x 1, magnesium  and epi were given. Patient was recently admitted from 12/4 to 12/9 due to acute on chronic hypoxic respiratory failure due to COPD expiration and acute bronchitis and was treated with IV steroids, azithromycin , nebulizer treatments.     ED course In the emergency department, temperature was 37.2 F, BP 152/48, O2 sat was 100% on BiPAP with FiO2 50%.  Workup in the ED showed normocytic anemia and leukocytosis. Chest x-ray showed streaky atelectasis or scarring at the left base. TRH was asked to admit patient.   Significant Events: Admitted 07/18/2024 for COPD exacerbation, acute on chronic hypoxic and hypercapnic respiratory failure 07-19-2024 RVP POSITIVE for Rhino/Enterovirus 07-20-2024 pt consented and transferred to [facility] at [facility] program 07-23-2024 due to pt's increased O2 need, profound weakness & unsafe functional status (legally blind and unable to take meds without in-person assistance), patient was  transferred back to [facility] hospital today.   Admission Labs: VBG pH 7.40, PCO2 of 56 Lactic acid 1.5 WBC 15.7, HgB 11.3 Plt 393 proBNP 214 Na 139, K 3.6, CO2 of 34, BUN 26, Scr 0.85, glu 216 Covid/Flu/RSV negative Mg 2.3   Admission Imaging Studies: CXR No active cardiopulmonary disease.    Significant Labs: Mg 1.6 (07/21/24) CO2 34 >> 30 (07/21/24) proBNP 349.0 (07/21/24) WBC trend 18.2 >> 13.4 >> 13.7 >> 14.3 today Hbg trend 11.8 >> 10.6 >> 12.4 >> 11.6today   Assessment and Plan:  # Acute on chronic respiratory failure with hypoxia and hypercapnia  07/23/2024 due to COPD exacerbation from +Rhinovirus infection. Pt not requiring Bipiap. Chronic O2 requirement of 4 L/min at home (5-6 L/min with exertion).  O2 at rest turned up to 5 L/min due to work of breathing and borderline O2 sats --Continue with home O2.  --Titrate to keep O2 sats >=90%      # COPD with acute exacerbation Continue DuoNeb Q6 hourly scheduled Continue IV Solu-Medrol  Mucinex  6 mg p.o. twice daily S/p doxycycline  twice daily for 5 days completed course --She has previously scheduled Pulmonology follow up upcoming     # Rhinovirus infection --continue supportive care.  --On po doxycyline to prevent bacterial infection of her severe COPD.   # TOBACCO ABUSE Pt advised multiple times during [facility] consenting process that she is not allowed to smoke while under the care of Hospital at Home. Dtr removed all tobacco materials, cigarettes, lighters, etc. From home --Continue with nicotine  patches --Added nicorette  gum PRN   # Hypokalemia, mild, repleted.     # Hypomagnesemia  07/21/24 - Mg 1.6 was replaced 07/23/24 - mg to 1.8 1/10 Mag 1.5 --Monitor & replace Mg PRN   #(HFpEF) heart failure with preserved ejection fraction (HCC) Chronic. Not exacerbated.  Alkalosis improved CO2 34>>30 >>31 with Demadex  on hold (hypercapnia +/- contraction).  proBNP 349.0 (normal for age). --Demadex  resumed  today --Monitor volume status closely --BMP in AM   # CAD, HTN, HLD: Continued Zetia , Lipitor , Bystolic , Demadex , aspirin , Brilinta  # Hypothyroid: Synthroid  # NIDDM T2: Continue metformin  and Jardiance   # Anxiety and depression, continue Lexapro  and BuSpar  # CKD stage II, stable     Body mass index is 32.49 kg/m.  Interventions:  Diet: Heart healthy DVT Prophylaxis: SCDs, patient is on DAPT  Advance goals of care discussion: Full code  Family Communication: family was not present at bedside, at the time of interview.  The pt provided permission to discuss medical plan with the family. Opportunity was given to ask question and all questions were answered satisfactorily.   Disposition:  Pt is from home, admitted with respiratory failure due to rhinovirus, still has shortness of breath, which precludes a safe discharge. Discharge to home, when stable, may need 1-2 more days to improve.  Subjective: No significant events overnight.  Patient feels improvement in the shortness of breath, still has mild shortness of breath.  Patient was complaining of cramps in the bilateral hands and twisting which resolved with pain medication.  Currently asymptomatic.  Physical Exam: General: NAD, lying comfortably Appear in no distress, affect appropriate Eyes: PERRLA ENT: Oral Mucosa Clear, moist  Neck: no JVD,  Cardiovascular: S1 and S2 Present, no Murmur,  Respiratory: good air entry bilaterally, mild crackles and mild wheezes bilaterally.   Abdomen: Bowel Sound present, Soft and no tenderness,  Skin: no rashes Extremities: no Pedal edema, no calf tenderness Neurologic: without any new focal findings Gait not checked due to patient safety concerns  Vitals:   07/24/24 0743 07/24/24 0832 07/24/24 1141 07/24/24 1554  BP: 132/68  131/80 111/72  Pulse: 89  75 (!) 101  Resp:    18  Temp: 98.9 F (37.2 C)  98.6 F (37 C) 99.6 F (37.6 C)  TempSrc:    Oral  SpO2: 94% 95% 94% 99%   Weight:      Height:        Intake/Output Summary (Last 24 hours) at 07/24/2024 1607 Last data filed at 07/24/2024 0700 Gross per 24 hour  Intake 337 ml  Output --  Net 337 ml   Filed Weights   07/18/24 2243 07/20/24 1423  Weight: 78 kg 78 kg    Data Reviewed: I have personally reviewed and interpreted daily labs, tele strips, imagings as discussed above. I reviewed all nursing notes, pharmacy notes, vitals, pertinent old records I have discussed plan of care as described above with RN and patient/family.  CBC: Recent Labs  Lab 07/18/24 2240 07/18/24 2241 07/20/24 0812 07/21/24 0900 07/22/24 1044 07/23/24 1255 07/24/24 0859  WBC 15.7*   < > 18.2* 13.4* 13.7* 14.3* 16.1*  NEUTROABS 9.3*  --   --  9.1*  --   --   --   HGB 11.3*   < > 11.8* 10.6* 12.4 11.6* 11.7*  HCT 37.5   < > 38.3 35.1* 40.8 37.7 37.0  MCV 95.7   < > 93.6 93.9 94.4 92.6 89.6  PLT 393   < > 437* 429* 496* 519* 552*   < > = values in this interval not displayed.   Basic  Metabolic Panel: Recent Labs  Lab 07/19/24 0339 07/21/24 0900 07/22/24 1044 07/23/24 1255 07/24/24 0859  NA 139 141 140 142 139  K 3.9 4.4 4.4 4.9 3.4*  CL 99 104 103 105 98  CO2 30 30 31 29 31   GLUCOSE 266* 83 74 79 170*  BUN 25* 14 14 20  31*  CREATININE 0.78 0.65 0.73 0.78 0.92  CALCIUM  9.4 8.9 9.4 9.0 9.6  MG 2.3 1.6* 2.0 1.8 1.5*  PHOS 2.9  --   --   --  3.2    Studies: No results found.  Scheduled Meds:  aspirin  EC  81 mg Oral q AM   atorvastatin   80 mg Oral Daily   busPIRone   60 mg Oral QHS   empagliflozin   10 mg Oral QAC breakfast   Ensifentrine   3 mg Inhalation BID   escitalopram   20 mg Oral Daily   ezetimibe   10 mg Oral Daily   gabapentin   600 mg Oral BID   guaiFENesin   1,200 mg Oral BID   ipratropium-albuterol   3 mL Nebulization Q6H   levothyroxine   112 mcg Oral Daily   metFORMIN   500 mg Oral BID WC   methylPREDNISolone  (SOLU-MEDROL ) injection  40 mg Intravenous Q24H   nebivolol   10 mg Oral Daily    nicotine   21 mg Transdermal Daily   pantoprazole   40 mg Oral Daily   ticagrelor   60 mg Oral BID   torsemide   40 mg Oral Daily   Continuous Infusions:  magnesium  sulfate bolus IVPB 2 g (07/24/24 1553)   PRN Meds: acetaminophen , albuterol , clonazePAM , guaiFENesin -dextromethorphan , ipratropium-albuterol , nicotine  polacrilex, oxyCODONE , prochlorperazine , sodium chloride   Time spent: 35 minutes  Author: ELVAN SOR. MD [facility] 07/24/2024 4:07 PM  To reach On-call, see care teams to locate the attending and reach out to them via [email or web address]. If 7PM-7AM, please contact night-coverage If you still have difficulty reaching the attending provider, please page the [facility] (Director on Call) for [facility] on amion for assistance.

## 2024-07-24 NOTE — Plan of Care (Signed)

## 2024-07-25 ENCOUNTER — Other Ambulatory Visit: Payer: Self-pay

## 2024-07-25 DIAGNOSIS — J9621 Acute and chronic respiratory failure with hypoxia: Secondary | ICD-10-CM | POA: Diagnosis not present

## 2024-07-25 DIAGNOSIS — J9622 Acute and chronic respiratory failure with hypercapnia: Secondary | ICD-10-CM | POA: Diagnosis not present

## 2024-07-25 LAB — BASIC METABOLIC PANEL WITH GFR
Anion gap: 13 (ref 5–15)
BUN: 45 mg/dL — ABNORMAL HIGH (ref 8–23)
CO2: 32 mmol/L (ref 22–32)
Calcium: 9.9 mg/dL (ref 8.9–10.3)
Chloride: 95 mmol/L — ABNORMAL LOW (ref 98–111)
Creatinine, Ser: 1.39 mg/dL — ABNORMAL HIGH (ref 0.44–1.00)
GFR, Estimated: 41 mL/min — ABNORMAL LOW
Glucose, Bld: 129 mg/dL — ABNORMAL HIGH (ref 70–99)
Potassium: 3.7 mmol/L (ref 3.5–5.1)
Sodium: 139 mmol/L (ref 135–145)

## 2024-07-25 LAB — CBC
HCT: 39.7 % (ref 36.0–46.0)
Hemoglobin: 12.6 g/dL (ref 12.0–15.0)
MCH: 28.4 pg (ref 26.0–34.0)
MCHC: 31.7 g/dL (ref 30.0–36.0)
MCV: 89.6 fL (ref 80.0–100.0)
Platelets: 574 K/uL — ABNORMAL HIGH (ref 150–400)
RBC: 4.43 MIL/uL (ref 3.87–5.11)
RDW: 16.1 % — ABNORMAL HIGH (ref 11.5–15.5)
WBC: 18 K/uL — ABNORMAL HIGH (ref 4.0–10.5)
nRBC: 0 % (ref 0.0–0.2)

## 2024-07-25 LAB — MAGNESIUM: Magnesium: 2.1 mg/dL (ref 1.7–2.4)

## 2024-07-25 LAB — PHOSPHORUS: Phosphorus: 5.6 mg/dL — ABNORMAL HIGH (ref 2.5–4.6)

## 2024-07-25 MED ORDER — PREDNISONE 10 MG PO TABS: 10.0000 mg | ORAL_TABLET | Freq: Every day | ORAL | Status: DC

## 2024-07-25 MED ORDER — PREDNISONE 20 MG PO TABS
30.0000 mg | ORAL_TABLET | Freq: Every day | ORAL | Status: DC
  Administered 2024-07-26: 30 mg via ORAL
  Filled 2024-07-25: qty 1

## 2024-07-25 MED ORDER — VITAMIN D (ERGOCALCIFEROL) 1.25 MG (50000 UNIT) PO CAPS
50000.0000 [IU] | ORAL_CAPSULE | ORAL | Status: DC
  Administered 2024-07-25: 50000 [IU] via ORAL
  Filled 2024-07-25: qty 1

## 2024-07-25 MED ORDER — ENSIFENTRINE 3 MG/2.5ML IN SUSP: 3.0000 mg | Freq: Two times a day (BID) | RESPIRATORY_TRACT | Status: DC

## 2024-07-25 MED ORDER — POLYETHYLENE GLYCOL 3350 17 G PO PACK: 17.0000 g | PACK | Freq: Every day | ORAL | Status: DC | PRN

## 2024-07-25 MED ORDER — PREDNISONE 20 MG PO TABS: 20.0000 mg | ORAL_TABLET | Freq: Every day | ORAL | Status: DC

## 2024-07-25 MED ORDER — TORSEMIDE 20 MG PO TABS
20.0000 mg | ORAL_TABLET | Freq: Every day | ORAL | Status: DC
  Administered 2024-07-26: 20 mg via ORAL
  Filled 2024-07-25: qty 1

## 2024-07-25 NOTE — Progress Notes (Signed)
 [facility] Progress Note  Patient: [name] [name]    FMW:[id number]  DOA: 07/18/2024     Date of Service: the patient was seen and examined on 07/25/2024  Chief Complaint  Patient presents with   Respiratory Distress   Brief hospital course: [name] [name] is a 69 y.o. female with medical history significant of hypertension, hyperlipidemia, T2DM, diabetic polyneuropathy, prolonged QTc, anxiety, depression, GERD, hypothyroidism, COPD, chronic hypoxic respiratory failure on 4 LPM of oxygen , chronic tobacco abuse who presents emergency department due to respiratory distress.  She was seen at [facility] on 07/16/2024 for same symptoms.  COVID and flu test done with negative and she was treated at the ED and discharged home with prednisone .  She has only taken 1 dose since that time. Shortness of breath worsened while going to the bathroom at home today and EMS was activated.  On arrival of EMS team, breathing treatment with albuterol  and Atrovent  were provided.  Solu-Medrol  125 mg x 1, magnesium  and epi were given. Patient was recently admitted from 12/4 to 12/9 due to acute on chronic hypoxic respiratory failure due to COPD expiration and acute bronchitis and was treated with IV steroids, azithromycin , nebulizer treatments.     ED course In the emergency department, temperature was 37.2 F, BP 152/48, O2 sat was 100% on BiPAP with FiO2 50%.  Workup in the ED showed normocytic anemia and leukocytosis. Chest x-ray showed streaky atelectasis or scarring at the left base. TRH was asked to admit patient.   Significant Events: Admitted 07/18/2024 for COPD exacerbation, acute on chronic hypoxic and hypercapnic respiratory failure 07-19-2024 RVP POSITIVE for Rhino/Enterovirus 07-20-2024 pt consented and transferred to Hospital at [facility] program 07-23-2024 due to pt's increased O2 need, profound weakness & unsafe functional status (legally blind and unable to take meds without in-person assistance), patient was  transferred back to [facility] hospital today.   Admission Labs: VBG pH 7.40, PCO2 of 56 Lactic acid 1.5 WBC 15.7, HgB 11.3 Plt 393 proBNP 214 Na 139, K 3.6, CO2 of 34, BUN 26, Scr 0.85, glu 216 Covid/Flu/RSV negative Mg 2.3   Admission Imaging Studies: CXR No active cardiopulmonary disease.    Significant Labs: Mg 1.6 (07/21/24) CO2 34 >> 30 (07/21/24) proBNP 349.0 (07/21/24) WBC trend 18.2 >> 13.4 >> 13.7 >> 14.3 today Hbg trend 11.8 >> 10.6 >> 12.4 >> 11.6today   Assessment and Plan:  # Acute on chronic respiratory failure with hypoxia and hypercapnia  07/23/2024 due to COPD exacerbation from +Rhinovirus infection. Pt not requiring Bipiap. Chronic O2 requirement of 4 L/min at home (5-6 L/min with exertion).  O2 at rest turned up to 5 L/min due to work of breathing and borderline O2 sats --Continue with home O2.  --Titrate to keep O2 sats >=90%      # COPD with acute exacerbation Continue DuoNeb Q6 hourly scheduled S/p  Solu-Medrol  40 mg IV daily x 5 days, transition to prednisone  oral tapering dose Mucinex  6 mg p.o. twice daily S/p doxycycline  twice daily for 5 days completed course --She has previously scheduled Pulmonology follow up upcoming    # Rhinovirus infection --continue supportive care.  --s/p po doxycyline to prevent bacterial infection of her severe COPD.   # TOBACCO ABUSE Pt advised multiple times during [facility] consenting process that she is not allowed to smoke while under the care of Hospital at Home. Dtr removed all tobacco materials, cigarettes, lighters, etc. From home --Continue with nicotine  patches --Added nicorette  gum PRN   #  Hypokalemia, mild, repleted.     # Hypomagnesemia 07/21/24 - Mg 1.6 was replaced 07/23/24 - mg to 1.8 1/10 Mag 1.5 --Monitor & replace Mg PRN   #(HFpEF) heart failure with preserved ejection fraction (HCC) Chronic. Not exacerbated.  Alkalosis improved CO2 34>>30 >>31 with Demadex  on hold (hypercapnia +/-  contraction).  proBNP 349.0 (normal for age). --Demadex  40 mg pod 1/11 hold of torsemide  today, creatinine slightly elevated.  Decrease dose of torsemide  from 40 to 20 mg p.o. daily if renal functions remained stable --Monitor volume status closely --BMP in AM sCr 0.92>>1.39  # CAD, HTN, HLD: Continued Zetia , Lipitor , Bystolic , Demadex , aspirin , Brilinta  # Hypothyroid: Synthroid  # NIDDM T2: Continue metformin  and Jardiance   # Anxiety and depression, continue Lexapro  and BuSpar  # CKD stage II, stable  # Vitamin D  deficiency: vit D level 18.2, started vitamin D  50,000 units p.o. weekly, follow with PCP to repeat vitamin D  level after 3 to 6 months.      Body mass index is 32.49 kg/m.  Interventions:  Diet: Heart healthy DVT Prophylaxis: SCDs, patient is on DAPT  Advance goals of care discussion: Full code  Family Communication: family was not present at bedside, at the time of interview.  The pt provided permission to discuss medical plan with the family. Opportunity was given to ask question and all questions were answered satisfactorily.   Disposition:  Pt is from home, admitted with respiratory failure due to rhinovirus, still has shortness of breath, which precludes a safe discharge. Discharge to home with [facility], when stable, most likely tomorrow a.m.  Subjective: No significant events overnight.  Patient still has cough and mild wheezing, denies any worsening of symptoms overall she feels improvement. Patient was sitting comfortably on the recliner, did work with the PT.   Physical Exam: General: NAD, lying comfortably Appear in no distress, affect appropriate Eyes: PERRLA ENT: Oral Mucosa Clear, moist  Neck: no JVD,  Cardiovascular: S1 and S2 Present, no Murmur,  Respiratory: good air entry bilaterally, mild crackles and mild wheezes bilaterally.   Abdomen: Bowel Sound present, Soft and no tenderness,  Skin: no rashes Extremities: no Pedal edema, no calf  tenderness Neurologic: without any new focal findings Gait not checked due to patient safety concerns  Vitals:   07/25/24 0454 07/25/24 0811 07/25/24 0818 07/25/24 1128  BP: (!) 158/71 (!) 132/57  139/81  Pulse: 82 74  78  Resp:      Temp: 97.8 F (36.6 C) 97.9 F (36.6 C)  98.3 F (36.8 C)  TempSrc:      SpO2: 96% 100% 95% 100%  Weight:      Height:        Intake/Output Summary (Last 24 hours) at 07/25/2024 1358 Last data filed at 07/24/2024 2100 Gross per 24 hour  Intake 430 ml  Output --  Net 430 ml   Filed Weights   07/18/24 2243 07/20/24 1423  Weight: 78 kg 78 kg    Data Reviewed: I have personally reviewed and interpreted daily labs, tele strips, imagings as discussed above. I reviewed all nursing notes, pharmacy notes, vitals, pertinent old records I have discussed plan of care as described above with RN and patient/family.  CBC: Recent Labs  Lab 07/18/24 2240 07/18/24 2241 07/21/24 0900 07/22/24 1044 07/23/24 1255 07/24/24 0859 07/25/24 0525  WBC 15.7*   < > 13.4* 13.7* 14.3* 16.1* 18.0*  NEUTROABS 9.3*  --  9.1*  --   --   --   --  HGB 11.3*   < > 10.6* 12.4 11.6* 11.7* 12.6  HCT 37.5   < > 35.1* 40.8 37.7 37.0 39.7  MCV 95.7   < > 93.9 94.4 92.6 89.6 89.6  PLT 393   < > 429* 496* 519* 552* 574*   < > = values in this interval not displayed.   Basic Metabolic Panel: Recent Labs  Lab 07/19/24 0339 07/21/24 0900 07/22/24 1044 07/23/24 1255 07/24/24 0859 07/25/24 0525  NA 139 141 140 142 139 139  K 3.9 4.4 4.4 4.9 3.4* 3.7  CL 99 104 103 105 98 95*  CO2 30 30 31 29 31  32  GLUCOSE 266* 83 74 79 170* 129*  BUN 25* 14 14 20  31* 45*  CREATININE 0.78 0.65 0.73 0.78 0.92 1.39*  CALCIUM  9.4 8.9 9.4 9.0 9.6 9.9  MG 2.3 1.6* 2.0 1.8 1.5* 2.1  PHOS 2.9  --   --   --  3.2 5.6*    Studies: No results found.  Scheduled Meds:  aspirin  EC  81 mg Oral q AM   atorvastatin   80 mg Oral Daily   busPIRone   60 mg Oral QHS   empagliflozin   10 mg Oral QAC  breakfast   Ensifentrine   3 mg Inhalation BID   escitalopram   20 mg Oral Daily   ezetimibe   10 mg Oral Daily   gabapentin   600 mg Oral BID   guaiFENesin   1,200 mg Oral BID   ipratropium-albuterol   3 mL Nebulization BID   levothyroxine   112 mcg Oral Daily   metFORMIN   500 mg Oral BID WC   methylPREDNISolone  (SOLU-MEDROL ) injection  40 mg Intravenous Q24H   nebivolol   10 mg Oral Daily   nicotine   21 mg Transdermal Daily   pantoprazole   40 mg Oral Daily   ticagrelor   60 mg Oral BID   [START ON 07/26/2024] torsemide   20 mg Oral Daily   Vitamin D  (Ergocalciferol )  50,000 Units Oral Q7 days   Continuous Infusions:   PRN Meds: acetaminophen , albuterol , clonazePAM , guaiFENesin -dextromethorphan , ipratropium-albuterol , nicotine  polacrilex, oxyCODONE , prochlorperazine , sodium chloride   Time spent: 35 minutes  Author: ELVAN SOR. MD [facility] 07/25/2024 1:58 PM  To reach On-call, see care teams to locate the attending and reach out to them via [email or web address]. If 7PM-7AM, please contact night-coverage If you still have difficulty reaching the attending provider, please page the [facility] (Director on Call) for [facility] on amion for assistance.

## 2024-07-25 NOTE — Progress Notes (Signed)
 Occupational Therapy Treatment Patient Details Name: [name] [name] MRN: [id number] DOB: 15-May-1956 Today's Date: 07/25/2024   History of present illness 69 y.o. F adm 1/4 respiratory distress. Pt d/c to H@H  program 1/06, returned to [facility] 1/09 for increased O2 needs, and profound weakness.  PMH: HTN, CAD , DM2, COPD on 4L O2, hypothyroidism, RTC repair 12/24/2021, ongoing tobacco abuse. Recent admission 12/4 - 12/9 d/t acute on chronic hypoxic respiratory failure   OT comments  Pt progressing towards goals. Pt returned to [facility] after being d/c to hospital @ home program. Pt reporting feeling weakness, but agreeable to session. Standing ADLs. LB dressing, and functional mobility completed at supervision level. Pt continues to be limited by decreased activity tolerance. OT discussed d/c plan with pt, who remains functionally appropriate for hospital @ home program. Educated pt on long term solutions such as moving in with her daughter or ALF. Recommending once d/c from hospital HHOT. Will continue to follow acutely.       If plan is discharge home, recommend the following:  Assistance with cooking/housework;Assist for transportation;Help with stairs or ramp for entrance   Equipment Recommendations  None recommended by OT       Precautions / Restrictions Precautions Precautions: Fall Recall of Precautions/Restrictions: Impaired Restrictions Weight Bearing Restrictions Per Provider Order: No       Mobility Bed Mobility Overal bed mobility: Modified Independent       General bed mobility comments: HOB elevated    Transfers Overall transfer level: Needs assistance Equipment used: Rollator (4 wheels) Transfers: Sit to/from Stand Sit to Stand: Supervision           General transfer comment: S for safety. Cues for use of brakes on rollator. 14ft     Balance Overall balance assessment: Needs assistance Sitting-balance support: No upper extremity supported, Feet supported Sitting  balance-Leahy Scale: Good     Standing balance support: Bilateral upper extremity supported, During functional activity, Reliant on assistive device for balance Standing balance-Leahy Scale: Fair Standing balance comment: Benefits from BUE support     ADL either performed or assessed with clinical judgement   ADL Overall ADL's : Needs assistance/impaired Eating/Feeding: Set up;Sitting   Grooming: Oral care;Wash/dry face;Supervision/safety;Standing   Upper Body Bathing: Set up;Sitting   Lower Body Bathing: Supervison/ safety;Sit to/from stand   Upper Body Dressing : Set up;Sitting   Lower Body Dressing: Supervision/safety;Sit to/from stand   Toilet Transfer: Supervision/safety;Ambulation;Rolling walker (2 wheels)           Functional mobility during ADLs: Supervision/safety;Rolling walker (2 wheels) General ADL Comments: S for safety, decreased activity tolerance    Extremity/Trunk Assessment Upper Extremity Assessment Upper Extremity Assessment: Generalized weakness   Lower Extremity Assessment Lower Extremity Assessment: Defer to PT evaluation        Vision   Vision Assessment?: No apparent visual deficits         Communication Communication Communication: No apparent difficulties   Cognition Arousal: Alert Behavior During Therapy: WFL for tasks assessed/performed Cognition: No apparent impairments     OT - Cognition Comments: Pt with baseline poor safety awareness, but appears more sensible today     Following commands: Intact        Cueing   Cueing Techniques: Verbal cues        General Comments Session conducted on 3L, O2 sats sustaining 94% or greater    Pertinent Vitals/ Pain       Pain Assessment Pain Assessment: No/denies pain   Frequency  Min 2X/week  Progress Toward Goals  OT Goals(current goals can now be found in the care plan section)  Progress towards OT goals: Progressing toward goals  Acute Rehab OT  Goals Patient Stated Goal: To get better OT Goal Formulation: With patient Time For Goal Achievement: 08/03/24 Potential to Achieve Goals: Good ADL Goals Pt Will Perform Tub/Shower Transfer: with modified independence;Independently;ambulating;shower seat;grab bars Additional ADL Goal #1: Pt will verbalize and demo 3 energy conservation strategies for ADLs and ADL mobility tasks  Plan         AM-PAC OT 6 Clicks Daily Activity     Outcome Measure   Help from another person eating meals?: None Help from another person taking care of personal grooming?: A Little Help from another person toileting, which includes using toliet, bedpan, or urinal?: A Little Help from another person bathing (including washing, rinsing, drying)?: A Little Help from another person to put on and taking off regular upper body clothing?: A Little Help from another person to put on and taking off regular lower body clothing?: A Little 6 Click Score: 19    End of Session Equipment Utilized During Treatment: Rollator (4 wheels);Oxygen   OT Visit Diagnosis: Muscle weakness (generalized) (M62.81);Unsteadiness on feet (R26.81)   Activity Tolerance Patient tolerated treatment well   Patient Left in chair;with call bell/phone within reach;with chair alarm set   Nurse Communication Mobility status        Time: 9150-9088 OT Time Calculation (min): 22 min  Charges: OT General Charges $OT Visit: 1 Visit OT Treatments $Self Care/Home Management : 8-22 mins  Adrianne BROCKS, OT  Acute Rehabilitation Services Office [phone] Secure chat preferred   Adrianne GORMAN Savers 07/25/2024, 9:40 AM

## 2024-07-25 NOTE — Plan of Care (Signed)

## 2024-07-26 ENCOUNTER — Encounter: Payer: Self-pay | Admitting: Hematology

## 2024-07-26 ENCOUNTER — Encounter (HOSPITAL_COMMUNITY): Payer: Self-pay | Admitting: Internal Medicine

## 2024-07-26 ENCOUNTER — Other Ambulatory Visit (HOSPITAL_COMMUNITY): Payer: Self-pay

## 2024-07-26 ENCOUNTER — Other Ambulatory Visit: Payer: Self-pay

## 2024-07-26 DIAGNOSIS — J9622 Acute and chronic respiratory failure with hypercapnia: Secondary | ICD-10-CM | POA: Diagnosis not present

## 2024-07-26 DIAGNOSIS — J9621 Acute and chronic respiratory failure with hypoxia: Secondary | ICD-10-CM | POA: Diagnosis not present

## 2024-07-26 LAB — CBC
HCT: 37.4 % (ref 36.0–46.0)
Hemoglobin: 11.9 g/dL — ABNORMAL LOW (ref 12.0–15.0)
MCH: 28.4 pg (ref 26.0–34.0)
MCHC: 31.8 g/dL (ref 30.0–36.0)
MCV: 89.3 fL (ref 80.0–100.0)
Platelets: 558 K/uL — ABNORMAL HIGH (ref 150–400)
RBC: 4.19 MIL/uL (ref 3.87–5.11)
RDW: 16 % — ABNORMAL HIGH (ref 11.5–15.5)
WBC: 18.7 K/uL — ABNORMAL HIGH (ref 4.0–10.5)
nRBC: 0.1 % (ref 0.0–0.2)

## 2024-07-26 LAB — BASIC METABOLIC PANEL WITH GFR
Anion gap: 11 (ref 5–15)
BUN: 34 mg/dL — ABNORMAL HIGH (ref 8–23)
CO2: 29 mmol/L (ref 22–32)
Calcium: 10 mg/dL (ref 8.9–10.3)
Chloride: 97 mmol/L — ABNORMAL LOW (ref 98–111)
Creatinine, Ser: 0.85 mg/dL (ref 0.44–1.00)
GFR, Estimated: >60 mL/min
Glucose, Bld: 140 mg/dL — ABNORMAL HIGH (ref 70–99)
Potassium: 3.7 mmol/L (ref 3.5–5.1)
Sodium: 137 mmol/L (ref 135–145)

## 2024-07-26 MED ORDER — ESCITALOPRAM OXALATE 20 MG PO TABS
20.0000 mg | ORAL_TABLET | Freq: Every day | ORAL | Status: DC
  Administered 2024-07-26: 20 mg via ORAL
  Filled 2024-07-26: qty 2
  Filled 2024-07-26: qty 1

## 2024-07-26 MED ORDER — GUAIFENESIN-DM 100-10 MG/5ML PO SYRP
15.0000 mL | ORAL_SOLUTION | ORAL | 0 refills | Status: AC | PRN
  Filled 2024-07-26 – 2024-07-27 (×2): qty 118, 2d supply, fill #0

## 2024-07-26 MED ORDER — GUAIFENESIN ER 600 MG PO TB12
1200.0000 mg | ORAL_TABLET | Freq: Two times a day (BID) | ORAL | 0 refills | Status: AC
  Filled 2024-07-26: qty 28, 7d supply, fill #0

## 2024-07-26 MED ORDER — PREDNISONE 10 MG PO TABS
ORAL_TABLET | ORAL | 0 refills | Status: AC
  Filled 2024-07-26 – 2024-07-27 (×2): qty 15, 8d supply, fill #0

## 2024-07-26 MED ORDER — VITAMIN D (ERGOCALCIFEROL) 1.25 MG (50000 UNIT) PO CAPS
50000.0000 [IU] | ORAL_CAPSULE | ORAL | 0 refills | Status: AC
  Filled 2024-07-26 – 2024-07-27 (×2): qty 12, 84d supply, fill #0

## 2024-07-26 NOTE — Discharge Summary (Signed)
 [facility] Discharge Summary   Patient: [name] [name] FMW:[id number]  PCP: Vicci Barnie [name], MD  Date of admission: 07/18/2024   Date of discharge:  07/26/2024     Discharge Diagnoses:  Principal Problem:   Acute on chronic respiratory failure with hypoxia and hypercapnia (HCC) Active Problems:   TOBACCO ABUSE   Rhinovirus infection   COPD with acute exacerbation (HCC)   Chronic hypoxic respiratory failure (HCC)   Advanced directives, counseling/discussion   (HFpEF) heart failure with preserved ejection fraction (HCC)   Hypomagnesemia   Acquired hypothyroidism   Chronic depression   Essential hypertension   Hyperlipidemia   GAD (generalized anxiety disorder)   Major depressive disorder, recurrent, in full remission with anxious distress   CKD (chronic kidney disease) stage 2, GFR 60-89 ml/min   Long-term current use of benzodiazepine   Non-insulin  dependent type 2 diabetes mellitus (HCC)   Obesity, Class II, BMI 35-39.9   Admitted From: Hme Disposition:  Home with [facility] Services  Recommendations for Outpatient Follow-up:  Follow-up with PCP in 1 week Follow up LABS/TEST: Repeat chest x-ray after 4 weeks for resolution of pneumonia Repeat vitamin D  level in between 3 to 6 months    Contact information for follow-up providers     Vicci Barnie [name], MD Follow up.   Specialty: Internal Medicine Contact information: [address] [place] [address] [phone]              Contact information for after-discharge care     Home Medical Care     [facility] .   Service: Home Health Services Contact information: [address] [place]  [address] [phone]                    Diet recommendation: Cardiac and Carb modified diet  Activity: The patient is advised to gradually reintroduce usual activities, as tolerated  Discharge Condition: stable  Code Status: Full  code   History of present illness: As per the H and P dictated on admission.  Hospital Course:  [name] [name] is a 69 y.o. female with medical history significant of hypertension, hyperlipidemia, T2DM, diabetic polyneuropathy, prolonged QTc, anxiety, depression, GERD, hypothyroidism, COPD, chronic hypoxic respiratory failure on 4 LPM of oxygen , chronic tobacco abuse who presents emergency department due to respiratory distress.  [facility] on 07/16/2024 for same symptoms.  COVID and flu test done with negative and she was treated at the ED and discharged home with prednisone .  She has only taken 1 dose since that time. Shortness of breath worsened while going to the bathroom at home today and EMS was activated.  On arrival of EMS team, breathing treatment with albuterol  and Atrovent  were provided.  Solu-Medrol  125 mg x 1, magnesium  and epi were given. Patient was recently admitted from 12/4 to 12/9 due to acute on chronic hypoxic respiratory failure due to COPD expiration and acute bronchitis and was treated with IV steroids, azithromycin , nebulizer treatments.     ED course In the emergency department, temperature was 37.2 F, BP 152/48, O2 sat was 100% on BiPAP with FiO2 50%.  Workup in the ED showed normocytic anemia and leukocytosis. Chest x-ray showed streaky atelectasis or scarring at the left base. TRH was asked to admit patient.   Significant Events: Admitted 07/18/2024 for COPD exacerbation, acute on chronic hypoxic and hypercapnic respiratory failure 07-19-2024 RVP POSITIVE for Rhino/Enterovirus 07-20-2024 pt consented  and transferred to [facility] program 07-23-2024 due to pt's increased O2 need, profound weakness & unsafe functional status (legally blind and unable to take meds without in-person assistance), patient was transferred back to [facility] today.   Admission Labs: VBG pH 7.40, PCO2 of 56 Lactic acid 1.5 WBC 15.7, HgB 11.3 Plt 393 proBNP 214 Na  139, K 3.6, CO2 of 34, BUN 26, Scr 0.85, glu 216 Covid/Flu/RSV negative Mg 2.3   Admission Imaging Studies: CXR No active cardiopulmonary disease.    Significant Labs: Mg 1.6 (07/21/24) CO2 34 >> 30 (07/21/24) proBNP 349.0 (07/21/24) WBC trend 18.2 >> 13.4 >> 13.7 >> 14.3 Hbg trend 11.8 >> 10.6 >> 12.4 >> 11.6    Assessment and Plan:  # Acute on chronic respiratory failure with hypoxia and hypercapnia  07/23/2024 due to COPD exacerbation from +Rhinovirus infection. Pt not requiring Bipiap. Chronic O2 requirement of 4 L/min at home (5-6 L/min with exertion).   1/12 shortness of breath improved, currently saturating 98% on 2 L/min   # COPD with acute exacerbation S/p DuoNeb Q6 hourly scheduled S/p  Solu-Medrol  40 mg IV daily x 5 days, transition to prednisone  oral tapering dose. Mucinex  600 mg p.o. twice daily. S/p doxycycline  twice daily for 5 days completed course.  Patient was discharged on tapering dose of prednisone .  Recommended to follow with PCP and pulmonary in 1 week as an outpatient.  # Rhinovirus infection --s/p po doxycyline to prevent bacterial infection of her severe COPD.   # TOBACCO ABUSE Pt advised multiple times during [facility] consenting process that she is not allowed to smoke while under the care of Hospital at Home. Dtr removed all tobacco materials, cigarettes, lighters, etc. From home --Continue with nicotine  patches. S/p Added nicorette  gum PRN   # Hypokalemia, mild, repleted.  Resolved # Hypomagnesemia: Mag repleted and resolved. #(HFpEF) heart failure with preserved ejection fraction Chronic. Not exacerbated.  Alkalosis improved CO2 34>>30 >>31 with Demadex  on hold (hypercapnia +/- contraction).  proBNP 349.0 (normal for age). Demadex  40 mg pod 1/11 held torsemide , due to elevated creatinine.   sCr 0.92>>1.39>> 0.85, creatinine improved.  Patient is euvolemic so changed torsemide  from 40 mg pod to 20-40 mg pod PRN, depending on edema and shortness of  breath.  Patient was advised to adjust torsemide  according to volume status.  And follow with PCP.   # CAD, HTN, HLD: Continued Zetia , Lipitor , Bystolic , Demadex , aspirin , Brilinta  # Hypothyroid: Synthroid  # NIDDM T2: Continue metformin  and Jardiance   # Anxiety and depression, continue Lexapro  and BuSpar  # CKD stage II, stable sCr 0.85 today    # Vitamin D  deficiency: vit D level 18.2, started vitamin D  50,000 units p.o. weekly, follow with PCP to repeat vitamin D  level after 3 to 6 months.   Body mass index is 32.49 kg/m.  Nutrition Interventions:  - Patient was instructed, not to drive, operate heavy machinery, perform activities at heights, swimming or participation in water activities or provide baby sitting services while on Pain, Sleep and Anxiety Medications; until her outpatient Physician has advised to do so again.  - Also recommended to not to take more than prescribed Pain, Sleep and Anxiety Medications.  Patient was seen by physical therapy, who recommended Home health, which was arranged. On the day of the discharge the patient's vitals were stable, and no other acute medical condition were reported by patient. the patient was felt safe to be discharge at Home with Home health.  Consultants: None  Procedures: None  Discharge Exam: General: Appear in no distress, Oral Mucosa Clear, moist. Cardiovascular: S1 and S2 Present, no Murmur, Respiratory: Good air entry bilaterally, mild bilateral crackles and mild wheezing.  Gradually improving. Abdomen: Bowel Sound present, Soft and no tenderness. Extremities: no Pedal edema, no calf tenderness Neurology: alert and oriented to time, place, and person affect appropriate.  Filed Weights   07/18/24 2243 07/20/24 1423  Weight: 78 kg 78 kg   Vitals:   07/26/24 0802 07/26/24 0807  BP: (!) 145/61   Pulse: 70 68  Resp: 18 19  Temp: 98.6 F (37 C)   SpO2: 98% 99%    DISCHARGE MEDICATION: Allergies as of 07/26/2024        Reactions   Avocado Anaphylaxis   Latex Shortness Of Breath, Rash   Codeine  Nausea Only        Medication List     STOP taking these medications    azithromycin  250 MG tablet Commonly known as: ZITHROMAX    nicotine  21 mg/24hr patch Commonly known as: NICODERM CQ  - dosed in mg/24 hours       TAKE these medications    acetaminophen  500 MG tablet Commonly known as: TYLENOL  Take 1 tablet (500 mg total) by mouth every 6 (six) hours as needed for mild pain (pain score 1-3), fever or headache.   alendronate  70 MG tablet Commonly known as: FOSAMAX  Take 1 tablet (70 mg total) by mouth every 7 (seven) days. Take with a full glass of water on an empty stomach.   aspirin  EC 81 MG tablet Take 81 mg by mouth in the morning.   atorvastatin  80 MG tablet Commonly known as: LIPITOR  Take 1 tablet (80 mg total) by mouth daily.   busPIRone  15 MG tablet Commonly known as: BUSPAR  Take 2 tablets (30 mg total) by mouth in the morning AND 1 tablet (15 mg total) daily at 12 noon AND 1 tablet (15 mg total) every evening. What changed: See the new instructions.   clonazePAM  0.5 MG tablet Commonly known as: KlonoPIN  Take 1 tablet (0.5 mg total) by mouth 2 (two) times daily as needed. What changed:  when to take this reasons to take this   doxepin  75 MG capsule Commonly known as: SINEQUAN  Take 1 capsule (75 mg total) by mouth at bedtime.   Dupixent  300 MG/2ML Soaj Generic drug: Dupilumab  Inject 300 mg into the skin every 14 (fourteen) days.   escitalopram  20 MG tablet Commonly known as: LEXAPRO  Take 1 tablet (20 mg total) by mouth daily.   ezetimibe  10 MG tablet Commonly known as: ZETIA  Take 1 tablet (10 mg total) by mouth daily.   gabapentin  600 MG tablet Commonly known as: NEURONTIN  Take 1 tablet (600 mg total) by mouth 2 (two) times daily.   guaiFENesin  600 MG 12 hr tablet Commonly known as: MUCINEX  Take 2 tablets (1,200 mg total) by mouth 2 (two) times daily for 7  days.   guaiFENesin -dextromethorphan  100-10 MG/5ML syrup Commonly known as: ROBITUSSIN DM Take 15 mLs by mouth every 4 (four) hours as needed for cough.   hydrOXYzine  25 MG tablet Commonly known as: ATARAX  Take 1 tablet (25 mg total) by mouth at bedtime as needed.   ipratropium-albuterol  0.5-2.5 (3) MG/3ML Soln Commonly known as: DUONEB Take 3 mLs by nebulization every 8 (eight) hours.   Jardiance  10 MG Tabs tablet Generic drug: empagliflozin  Take 1 tablet (10 mg total) by mouth daily before breakfast.   levothyroxine  112 MCG tablet Commonly known as: SYNTHROID   Take 1 tablet (112 mcg total) by mouth daily.   metFORMIN  500 MG tablet Commonly known as: GLUCOPHAGE  Take 1 tablet (500 mg total) by mouth 2 (two) times daily with a meal. What changed: when to take this   mirtazapine  45 MG tablet Commonly known as: REMERON  Take 1 tablet (45 mg total) by mouth at bedtime.   montelukast  10 MG tablet Commonly known as: SINGULAIR  Take 1 tablet (10 mg total) by mouth at bedtime.   nebivolol  10 MG tablet Commonly known as: Bystolic  Take 1 tablet (10 mg total) by mouth daily.   nitroGLYCERIN  0.4 MG SL tablet Commonly known as: NITROSTAT  Place 1 tablet (0.4 mg total) under the tongue every 5 (five) minutes as needed for chest pain.   Ohtuvayre  3 MG/2.5ML Susp Generic drug: Ensifentrine  Take 1 vial by nebulization in the morning and at bedtime.   omeprazole  40 MG capsule Commonly known as: PRILOSEC Take 1 capsule (40 mg total) by mouth 2 (two) times daily before a meal. NEED APPOINTMENT FOR ADDITIONAL REFILLS   OXYGEN  Inhale 4 L/min into the lungs continuous.   predniSONE  10 MG tablet Commonly known as: DELTASONE  Take 3 tablets (30 mg total) by mouth daily with breakfast for 2 days, THEN 2 tablets (20 mg total) daily with breakfast for 3 days, THEN 1 tablet (10 mg total) daily with breakfast for 3 days. Start taking on: July 27, 2024 What changed: See the new  instructions.   ticagrelor  60 MG Tabs tablet Commonly known as: Brilinta  Take 1 tablet (60 mg total) by mouth 2 (two) times daily.   torsemide  20 MG tablet Commonly known as: DEMADEX  Take 1-2 tablets (20-40 mg total) by mouth daily. Take 1 or 2 tablets, depending on lower extremity edema and shortness of breath What changed:  how much to take additional instructions   Trelegy Ellipta  200-62.5-25 MCG/ACT Aepb Generic drug: Fluticasone -Umeclidin-Vilant Inhale 1 puff into the lungs daily.   varenicline  1 MG tablet Commonly known as: CHANTIX  Take 1 tablet (1 mg total) by mouth 2 (two) times daily.   Ventolin  HFA 108 (90 Base) MCG/ACT inhaler Generic drug: albuterol  INHALE 2 PUFFS into THE lungs EVERY 6 HOURS AS NEEDED wheezing AND SHORTNESS OF BREATH   Vitamin D  (Ergocalciferol ) 1.25 MG (50000 UNIT) Caps capsule Commonly known as: DRISDOL  Take 1 capsule (50,000 Units total) by mouth every 7 (seven) days. Start taking on: August 01, 2024       Allergies[1] Discharge Instructions     Call MD for:  difficulty breathing, headache or visual disturbances   Complete by: As directed    Call MD for:  extreme fatigue   Complete by: As directed    Call MD for:  persistant dizziness or light-headedness   Complete by: As directed    Call MD for:  persistant nausea and vomiting   Complete by: As directed    Call MD for:  severe uncontrolled pain   Complete by: As directed    Call MD for:  temperature >100.4   Complete by: As directed    Discharge instructions   Complete by: As directed    Follow-up with PCP in 1 week Repeat chest x-ray after 4 weeks for resolution of pneumonia Repeat vitamin D  level in between 3 to 6 months   Increase activity slowly   Complete by: As directed        The results of significant diagnostics from this hospitalization (including imaging, microbiology, ancillary and laboratory) are listed below for reference.  Significant Diagnostic  Studies: DG Chest Port 1 View Result Date: 07/18/2024 EXAM: 1 VIEW(S) XRAY OF THE CHEST 07/18/2024 10:53:39 PM COMPARISON: 07/16/2024 CLINICAL HISTORY: SOB, Cough. FINDINGS: LUNGS AND PLEURA: No focal pulmonary opacity. No pleural effusion. No pneumothorax. HEART AND MEDIASTINUM: No acute abnormality of the cardiac and mediastinal silhouettes. BONES AND SOFT TISSUES: No acute osseous abnormality. IMPRESSION: 1. No active cardiopulmonary disease. Electronically signed by: Franky Crease MD 07/18/2024 10:55 PM EST RP Workstation: [id number]   DG Chest 2 View Result Date: 07/18/2024 CLINICAL DATA:  Follow-up pneumonia EXAM: CHEST - 2 VIEW COMPARISON:  06/19/2024 FINDINGS: Streaky atelectasis or scarring at the left base. Normal cardiomediastinal silhouette. Aortic atherosclerosis. Chronic left clavicle fracture IMPRESSION: Streaky atelectasis or scarring at the left base. Electronically Signed   By: Luke Bun M.D.   On: 07/18/2024 00:16   DG Chest 2 View Result Date: 07/16/2024 CLINICAL DATA:  Cough, shortness of breath EXAM: CHEST - 2 VIEW COMPARISON:  July 02, 2024 FINDINGS: The heart size and mediastinal contours are within normal limits. Stable minimal left basilar scarring is noted. No acute pulmonary disease is noted. Old left clavicular fracture. Old right rib fractures. IMPRESSION: No active cardiopulmonary disease. Electronically Signed   By: Lynwood Landy Raddle M.D.   On: 07/16/2024 18:24    Microbiology: Recent Results (from the past 240 hours)  Resp panel by RT-PCR (RSV, Flu A&B, Covid) Throat     Status: None   Collection Time: 07/16/24  5:54 PM   Specimen: Throat; Nasal Swab  Result Value Ref Range Status   SARS Coronavirus 2 by RT PCR NEGATIVE NEGATIVE Final    Comment: (NOTE) SARS-CoV-2 target nucleic acids are NOT DETECTED.  The SARS-CoV-2 RNA is generally detectable in upper respiratory specimens during the acute phase of infection. The lowest concentration of SARS-CoV-2 viral  copies this assay can detect is 138 copies/mL. A negative result does not preclude SARS-Cov-2 infection and should not be used as the sole basis for treatment or other patient management decisions. A negative result may occur with  improper specimen collection/handling, submission of specimen other than nasopharyngeal swab, presence of viral mutation(s) within the areas targeted by this assay, and inadequate number of viral copies(<138 copies/mL). A negative result must be combined with clinical observations, patient history, and epidemiological information. The expected result is Negative.  Fact Sheet for Patients:  bloggercourse.com  Fact Sheet for Healthcare Providers:  seriousbroker.it  This test is no t yet approved or cleared by the [place]  FDA and  has been authorized for detection and/or diagnosis of SARS-CoV-2 by FDA under an Emergency Use Authorization (EUA). This EUA will remain  in effect (meaning this test can be used) for the duration of the COVID-19 declaration under Section 564(b)(1) of the Act, 21 U.S.C.section 360bbb-3(b)(1), unless the authorization is terminated  or revoked sooner.       Influenza A by PCR NEGATIVE NEGATIVE Final   Influenza B by PCR NEGATIVE NEGATIVE Final    Comment: (NOTE) The Xpert Xpress SARS-CoV-2/FLU/RSV plus assay is intended as an aid in the diagnosis of influenza from Nasopharyngeal swab specimens and should not be used as a sole basis for treatment. Nasal washings and aspirates are unacceptable for Xpert Xpress SARS-CoV-2/FLU/RSV testing.  Fact Sheet for Patients: bloggercourse.com  Fact Sheet for Healthcare Providers: seriousbroker.it  This test is not yet approved or cleared by the [place]  FDA and has been authorized for detection and/or diagnosis of SARS-CoV-2 by FDA under an Emergency Use  Authorization (EUA). This  EUA will remain in effect (meaning this test can be used) for the duration of the COVID-19 declaration under Section 564(b)(1) of the Act, 21 U.S.C. section 360bbb-3(b)(1), unless the authorization is terminated or revoked.     Resp Syncytial Virus by PCR NEGATIVE NEGATIVE Final    Comment: (NOTE) Fact Sheet for Patients: bloggercourse.com  Fact Sheet for Healthcare Providers: seriousbroker.it  This test is not yet approved or cleared by the [place]  FDA and has been authorized for detection and/or diagnosis of SARS-CoV-2 by FDA under an Emergency Use Authorization (EUA). This EUA will remain in effect (meaning this test can be used) for the duration of the COVID-19 declaration under Section 564(b)(1) of the Act, 21 U.S.C. section 360bbb-3(b)(1), unless the authorization is terminated or revoked.  Performed at [facility], [address] [place], [place] [address]   Group A Strep by PCR     Status: None   Collection Time: 07/16/24  5:54 PM   Specimen: Throat; Sterile Swab  Result Value Ref Range Status   Group A Strep by PCR NOT DETECTED NOT DETECTED Final    Comment: Performed at [facility], [address] [place], [place] [address]  Resp panel by RT-PCR (RSV, Flu A&B, Covid) Anterior Nasal Swab     Status: None   Collection Time: 07/18/24 10:37 PM   Specimen: Anterior Nasal Swab  Result Value Ref Range Status   SARS Coronavirus 2 by RT PCR NEGATIVE NEGATIVE Final   Influenza A by PCR NEGATIVE NEGATIVE Final   Influenza B by PCR NEGATIVE NEGATIVE Final    Comment: (NOTE) The Xpert Xpress SARS-CoV-2/FLU/RSV plus assay is intended as an aid in the diagnosis of influenza from Nasopharyngeal swab specimens and should not be used as a sole basis for treatment. Nasal washings and aspirates are unacceptable for Xpert Xpress SARS-CoV-2/FLU/RSV testing.  Fact Sheet for  Patients: bloggercourse.com  Fact Sheet for Healthcare Providers: seriousbroker.it  This test is not yet approved or cleared by the [place]  FDA and has been authorized for detection and/or diagnosis of SARS-CoV-2 by FDA under an Emergency Use Authorization (EUA). This EUA will remain in effect (meaning this test can be used) for the duration of the COVID-19 declaration under Section 564(b)(1) of the Act, 21 U.S.C. section 360bbb-3(b)(1), unless the authorization is terminated or revoked.     Resp Syncytial Virus by PCR NEGATIVE NEGATIVE Final    Comment: (NOTE) Fact Sheet for Patients: bloggercourse.com  Fact Sheet for Healthcare Providers: seriousbroker.it  This test is not yet approved or cleared by the [place]  FDA and has been authorized for detection and/or diagnosis of SARS-CoV-2 by FDA under an Emergency Use Authorization (EUA). This EUA will remain in effect (meaning this test can be used) for the duration of the COVID-19 declaration under Section 564(b)(1) of the Act, 21 U.S.C. section 360bbb-3(b)(1), unless the authorization is terminated or revoked.  Performed at [facility], [address], [place], [place] [address]   Respiratory (~20 pathogens) panel by PCR     Status: Abnormal   Collection Time: 07/19/24  7:58 AM   Specimen: Nasopharyngeal Swab; Respiratory  Result Value Ref Range Status   Adenovirus NOT DETECTED NOT DETECTED Final   Coronavirus 229E NOT DETECTED NOT DETECTED Final    Comment: (NOTE) The Coronavirus on the Respiratory Panel, DOES NOT test for the novel  Coronavirus (2019 nCoV)    Coronavirus HKU1 NOT DETECTED NOT DETECTED Final  Coronavirus NL63 NOT DETECTED NOT DETECTED Final   Coronavirus OC43 NOT DETECTED NOT DETECTED Final   Metapneumovirus NOT DETECTED NOT DETECTED Final   Rhinovirus / Enterovirus DETECTED (A) NOT  DETECTED Final   Influenza A NOT DETECTED NOT DETECTED Final   Influenza B NOT DETECTED NOT DETECTED Final   Parainfluenza Virus 1 NOT DETECTED NOT DETECTED Final   Parainfluenza Virus 2 NOT DETECTED NOT DETECTED Final   Parainfluenza Virus 3 NOT DETECTED NOT DETECTED Final   Parainfluenza Virus 4 NOT DETECTED NOT DETECTED Final   Respiratory Syncytial Virus NOT DETECTED NOT DETECTED Final   Bordetella pertussis NOT DETECTED NOT DETECTED Final   Bordetella Parapertussis NOT DETECTED NOT DETECTED Final   Chlamydophila pneumoniae NOT DETECTED NOT DETECTED Final   Mycoplasma pneumoniae NOT DETECTED NOT DETECTED Final    Comment: Performed at [facility], [address], [place], [place] [address]     Labs: CBC: Recent Labs  Lab 07/21/24 0900 07/22/24 1044 07/23/24 1255 07/24/24 0859 07/25/24 0525 07/26/24 0525  WBC 13.4* 13.7* 14.3* 16.1* 18.0* 18.7*  NEUTROABS 9.1*  --   --   --   --   --   HGB 10.6* 12.4 11.6* 11.7* 12.6 11.9*  HCT 35.1* 40.8 37.7 37.0 39.7 37.4  MCV 93.9 94.4 92.6 89.6 89.6 89.3  PLT 429* 496* 519* 552* 574* 558*   Basic Metabolic Panel: Recent Labs  Lab 07/21/24 0900 07/22/24 1044 07/23/24 1255 07/24/24 0859 07/25/24 0525 07/26/24 0525  NA 141 140 142 139 139 137  K 4.4 4.4 4.9 3.4* 3.7 3.7  CL 104 103 105 98 95* 97*  CO2 30 31 29 31  32 29  GLUCOSE 83 74 79 170* 129* 140*  BUN 14 14 20  31* 45* 34*  CREATININE 0.65 0.73 0.78 0.92 1.39* 0.85  CALCIUM  8.9 9.4 9.0 9.6 9.9 10.0  MG 1.6* 2.0 1.8 1.5* 2.1  --   PHOS  --   --   --  3.2 5.6*  --    Liver Function Tests: Recent Labs  Lab 07/21/24 0900  AST 22  ALT 29  ALKPHOS 73  BILITOT <0.2  PROT 5.7*  ALBUMIN 3.6   No results for input(s): LIPASE, AMYLASE in the last 168 hours. No results for input(s): AMMONIA in the last 168 hours. Cardiac Enzymes: No results for input(s): CKTOTAL, CKMB, CKMBINDEX, TROPONINI in the last 168 hours. BNP (last 3 results) Recent Labs     12/05/23 0547 06/12/24 1528 06/18/24 0039  BNP 72.9 81.1 103.4*   CBG: Recent Labs  Lab 07/20/24 0624 07/20/24 1156 07/23/24 1306 07/23/24 1631 07/24/24 0002  GLUCAP 97 95 125* 297* 126*    Time spent: 35 minutes  Signed:  Elvan Sor  [facility] 07/26/2024 11:02 AM      [1]  Allergies Allergen Reactions   Avocado Anaphylaxis   Latex Shortness Of Breath and Rash   Codeine  Nausea Only

## 2024-07-26 NOTE — Progress Notes (Addendum)
 Discharge   Patient expressed verbal understanding of discharge POC.   Patient given time to ask any questions.  Additional education included in AVS.  Alert oriented in good spirits.    PIV removed. Pressure dressings intact.  All personal belongings at bedtime.  Pt on 3L [place], ambulates independently.   Spoke with daughter via phone. She will pick up shortly.   TOC meds in process, [employer] to deliver oxygen  tank to be delivered to lounge.

## 2024-07-26 NOTE — TOC Progression Note (Signed)
 Transition of Care [facility]) - Progression Note    Patient Details  Name: [name] [name] MRN: [id number] Date of Birth: 04/03/1956  Transition of Care [facility]) CM/SW Contact  Rosaline JONELLE Joe, RN Phone Number: 07/26/2024, 9:15 AM  Clinical Narrative:    CM met with the patient at the bedside.  Patient lives alone and plans to return home when stable.  Patient states that she has considered assisted living by declines at this time and plans to return home to her apartment.  The patient states that her daughter works during the day but will assist at the home as needed and plans to provide transportation to home when stable.  Patient and I discussed patient getting set up for Personal Care Services through [facility].  Patient was given forms to take to her PCP to have office fill out and send to assist with this program.  Patient was faxed out in the hub for home health.  Patient did not have a preference for agency.  [facility] was accepted in the hub.  I spoke with Burnard, CM with [facility] and they will also provide a MSW to help the patient establish PCS Services in the community once PCP fills  out documents and patient is assessed for services in the community setting.  DME in the home includes home oxygen  at 3-5 L/min [place] through [employer], RW, shower seat, Rolator, nebulizer.  Inpatient resources for SDOH provided in the AVs.  Patient plans to have daughter provide transportation to home by car when patient is stable for discharge.  Patient has portable oxygen  tanks through [employer] at home.     Barriers to Discharge: Continued Medical Work up (IV steroids)               Expected Discharge Plan and Services                                               Social Drivers of Health (SDOH) Interventions SDOH Screenings   Food Insecurity: No Food Insecurity (07/19/2024)  Recent Concern: Food Insecurity - Food Insecurity Present (06/18/2024)  Housing: Low Risk (07/19/2024)   Transportation Needs: No Transportation Needs (07/19/2024)  Utilities: Not At Risk (07/19/2024)  Alcohol Screen: Low Risk (06/18/2024)  Depression (PHQ2-9): Low Risk (07/02/2024)  Recent Concern: Depression (PHQ2-9) - Medium Risk (05/18/2024)  Financial Resource Strain: Medium Risk (06/18/2024)  Physical Activity: Inactive (06/18/2024)  Social Connections: Moderately Isolated (07/19/2024)  Stress: No Stress Concern Present (06/18/2024)  Tobacco Use: High Risk (07/18/2024)  Health Literacy: Inadequate Health Literacy (08/07/2023)    Readmission Risk Interventions    07/26/2024    9:14 AM 06/20/2023    3:17 PM  Readmission Risk Prevention Plan  Transportation Screening Complete Complete  HRI or Home Care Consult  Complete  Social Work Consult for Recovery Care Planning/Counseling  Complete  Palliative Care Screening  Not Applicable  Medication Review Oceanographer) Complete Complete  PCP or Specialist appointment within 3-5 days of discharge Complete   HRI or Home Care Consult Complete   SW Recovery Care/Counseling Consult Complete   Palliative Care Screening Complete   Skilled Nursing Facility Not Applicable

## 2024-07-27 ENCOUNTER — Other Ambulatory Visit (HOSPITAL_COMMUNITY): Payer: Self-pay

## 2024-07-27 ENCOUNTER — Telehealth: Payer: Self-pay | Admitting: *Deleted

## 2024-07-27 NOTE — Transitions of Care (Post Inpatient/ED Visit) (Signed)
" ° °  07/27/2024  Name: [name] [name] MRN: [id number] DOB: [birth year]  Today's TOC FU Call Status: Today's TOC FU Call Status:: Unsuccessful Call (1st Attempt) Unsuccessful Call (1st Attempt) Date: 07/27/24  Attempted to reach the patient regarding the most recent Inpatient/ED visit.  Follow Up Plan: Additional outreach attempts will be made to reach the patient to complete the Transitions of Care (Post Inpatient/ED visit) call.   Cathlean Headland BSN RN [place] [facility] Care Management Coordinator [email or web address] Direct Dial: [phone]  Fax: [phone] Website: [email or web address]  "

## 2024-07-28 ENCOUNTER — Other Ambulatory Visit: Payer: Self-pay

## 2024-07-28 ENCOUNTER — Telehealth (HOSPITAL_COMMUNITY): Payer: Self-pay | Admitting: Emergency Medicine

## 2024-07-28 ENCOUNTER — Encounter: Payer: Self-pay | Admitting: Hematology

## 2024-07-28 ENCOUNTER — Other Ambulatory Visit (HOSPITAL_COMMUNITY): Payer: Self-pay

## 2024-07-28 ENCOUNTER — Telehealth: Payer: Self-pay | Admitting: *Deleted

## 2024-07-28 NOTE — Telephone Encounter (Signed)
 Spoke to Ms. Lachney and set up home visit for tomorrow @ 1:00.  Also advised her that I had spoke with Cathlean Headland, RN w/ [facility]- She asked that I relay a message to Ms. Matsushita and let her know [employer], RN will be calling her tomorrow.  Ms. Cantrall advises she will be anticipating a phone call tomorrow.    Mary Sharps, EMT-Paramedic [phone] 07/28/2024

## 2024-07-28 NOTE — Transitions of Care (Post Inpatient/ED Visit) (Signed)
" ° °  07/28/2024  Name: [name] [name] MRN: [id number] DOB: 05/04/1956  Today's TOC FU Call Status: Today's TOC FU Call Status:: Unsuccessful Call (2nd Attempt) Unsuccessful Call (2nd Attempt) Date: 07/28/24  Attempted to reach the patient regarding the most recent Inpatient/ED visit.  Follow Up Plan: Additional outreach attempts will be made to reach the patient to complete the Transitions of Care (Post Inpatient/ED visit) call.   Cathlean Headland BSN RN [place] [facility] Care Management Coordinator [email or web address] Direct Dial: [phone]  Fax: [phone] Website: [email or web address]  "

## 2024-07-29 ENCOUNTER — Other Ambulatory Visit: Payer: Self-pay

## 2024-07-29 ENCOUNTER — Telehealth: Payer: Self-pay

## 2024-07-29 ENCOUNTER — Other Ambulatory Visit (HOSPITAL_COMMUNITY): Payer: Self-pay

## 2024-07-29 ENCOUNTER — Other Ambulatory Visit (HOSPITAL_COMMUNITY): Payer: Self-pay | Admitting: Emergency Medicine

## 2024-07-29 NOTE — Transitions of Care (Post Inpatient/ED Visit) (Signed)
 07/29/2024  Patient ID: [name] [name], female   DOB: November 01, 1955, 69 y.o.   MRN: [id number]  07/29/2024: Patient answered call but was with paramedicine. RN CM identified self as patient was expecting this call. After finding out paramedicine was present with patient, RN CM arranged to call patient back 07/30/2024 and patient agreed to this call back.    Bing Edison MSN, RN RN [facility]  VBCI-Population Health Office Hours M-F [phone] Direct Dial: [phone] Main Phone [phone]  Fax: [phone] [email or web address]

## 2024-07-30 ENCOUNTER — Other Ambulatory Visit: Payer: Self-pay

## 2024-07-30 ENCOUNTER — Telehealth: Payer: Self-pay | Admitting: Internal Medicine

## 2024-07-30 ENCOUNTER — Telehealth: Payer: Self-pay

## 2024-07-30 NOTE — Transitions of Care (Post Inpatient/ED Visit) (Signed)
" ° °  07/30/2024  Name: [name] [name] MRN: [id number] DOB: 31-May-1956  Today's TOC FU Call Status: Today's TOC FU Call Status:: Unsuccessful Call (3rd Attempt) Unsuccessful Call (1st Attempt) Date: 07/27/24 Unsuccessful Call (2nd Attempt) Date: 07/28/24 Unsuccessful Call (3rd Attempt) Date: 07/30/24  Attempted to reach the patient regarding the most recent Inpatient/ED visit.  Follow Up Plan: Additional outreach attempts will be made to reach the patient to complete the Transitions of Care (Post Inpatient/ED visit) call.   Medford Balboa, BSN, RN [place]  VBCI - Population Health RN Care Manager [phone]  "

## 2024-07-30 NOTE — Telephone Encounter (Signed)
 Copied from CRM [id number]. Topic: Clinical - Home Health Verbal Orders >> Jul 30, 2024  2:49 PM   Mia F wrote:  Caller/Agency: Rocky Haver [facility]  Callback Number: [phone] Service Requested: Physical Therapy Frequency: 1 time 4 weeks; 1 time every other week for 4 weeks Any new concerns about the patient? No

## 2024-08-03 ENCOUNTER — Other Ambulatory Visit: Payer: Self-pay

## 2024-08-03 NOTE — Telephone Encounter (Signed)
 Called but no answer. LVM to call back. Message states Rocky called but VM states This is Reena please leave VM.

## 2024-08-04 NOTE — Telephone Encounter (Signed)
 Copied from CRM #[id number]. Topic: General - Call Back - No Documentation >> Aug 04, 2024 10:06 AM   Gustabo D wrote:  Rocky Lunch [facility] home health physical therapist- Contact number [phone]- Just needs to know if orders are approved or not Pt can't be seen until she get confirmation. When returning call Rocky says to please leave full message on the VM she will most likely be with patients.

## 2024-08-04 NOTE — Progress Notes (Signed)
 Paramedicine Encounter    Patient ID: [name] [name], female    DOB: [birth year], 69 y.o.   MRN: [id number]   Complaints None   Assessment A&O x 4, skin W&D w/ good color.  Denies chest pain.  SOB is at baseline and pt wearing O2 @ 2lpm per normal.  Compliance with meds YES  Pill box filled Filled by pt's daughter  Refills needed NONE  Meds changes since last visit Prednisone  added    Social changes NONE   BP 118/60 (BP Location: Left Arm, Patient Position: Sitting, Cuff Size: Normal)   Pulse 82   Wt 163 lb (73.9 kg)   SpO2 94% Comment: 3 lpm O2 via cannula  BMI 30.80 kg/m  Weight yesterday- Last visit weight-  Attempted to review pt's med box for accuracy.  Due to pill bottles not being assessable could not complete.  Did note that pt's Gabapentin  dose in the pill box was 1200mg . BID and should be 600mg  BID.   Reached out to pt's daughter to discuss w/o resolution.  Pt has a hospital follow up with Dr. Vicci 2/5.  Attempted to assist w/ possibly getting the appointment a little sooner but was unable to do so.    ACTION: Home visit completed  [name] [name] [phone] 08/04/24  Patient Care Team: Vicci Barnie [name], MD as PCP - General (Internal Medicine) Floretta Mallard, MD as PCP - Cardiology (Cardiology) Shaaron Lamar HERO, MD as Consulting Physician (Gastroenterology) Onesimo Emaline Brink, MD as Consulting Physician (Hematology) Collene Reginia BRAVO, [place] as Physician Assistant (Physician Assistant) Carolee Heron [name], RN as Case Manager  Patient Active Problem List   Diagnosis Date Noted   Hypomagnesemia 07/21/2024   Obesity, Class II, BMI 35-39.9 07/20/2024   (HFpEF) heart failure with preserved ejection fraction (HCC) 06/18/2024   Non-insulin  dependent type 2 diabetes mellitus (HCC) 06/18/2024   History of depression 06/18/2024   Diabetic neuropathy (HCC) 06/18/2024   Acute on chronic heart failure with preserved ejection fraction (HFpEF, >= 50%) (HCC)  12/05/2023   At risk for polypharmacy 12/04/2023   Long-term current use of antidepressant 11/17/2023   Long-term current use of benzodiazepine 11/17/2023   CKD (chronic kidney disease) stage 2, GFR 60-89 ml/min 11/12/2023   Acute on chronic respiratory failure with hypoxia and hypercapnia (HCC) 10/05/2023   Chronic hypoxic respiratory failure (HCC) 09/01/2023   Continuous dependence on cigarette smoking 09/01/2023   History of CAD (coronary artery disease) 09/01/2023   Rhinovirus infection 06/20/2023   Full code status 06/14/2023   Nicotine  dependence 05/03/2023   Iron  deficiency anemia 11/02/2021   Major depressive disorder, recurrent, in full remission with anxious distress 05/31/2021   Chronic neck pain 02/26/2021   GAD (generalized anxiety disorder) 01/08/2021   Esophageal dysphagia 04/28/2019   Bipolar disorder (HCC) 09/17/2018   Insomnia 10/28/2017   Prediabetes 05/28/2017   Advanced directives, counseling/discussion 09/11/2015   Psoriasis 06/22/2015   COPD (chronic obstructive pulmonary disease) (HCC)GOLD E 05/18/2015   Anxiety and depression 07/10/2013   Essential hypertension 06/07/2013   Hyperlipidemia 06/07/2013   Asthma, chronic 06/07/2013   Emphysema lung (HCC) 06/07/2013   Chronic back pain 06/07/2013   CAD S/P percutaneous coronary angioplasty 06/07/2013   GERD (gastroesophageal reflux disease) 06/07/2013   Breast lump on left side at 3 o'clock position 10/13/2012   COPD with acute exacerbation (HCC) 09/15/2009   TOBACCO ABUSE 07/17/2009   Chronic rhinitis 07/17/2009   Lung nodule < 6cm on CT 07/17/2009   Acquired hypothyroidism 12/22/2008  Chronic depression 12/22/2008   Current Medications[1] Allergies[2]   Social History   Socioeconomic History   Marital status: Divorced    Spouse name: Not on file   Number of children: 1   Years of education: Not on file   Highest education level: 10th grade  Occupational History   Not on file  Tobacco Use    Smoking status: Every Day    Current packs/day: 0.50    Average packs/day: 0.5 packs/day for 50.0 years (25.0 ttl pk-yrs)    Types: Cigarettes   Smokeless tobacco: Never  Vaping Use   Vaping status: Some Days   Substances: Nicotine   Substance and Sexual Activity   Alcohol use: Yes    Comment: once a week   Drug use: Yes    Types: Marijuana    Comment: history of cocaine use, been about a year, per patient (12/18/21)   Sexual activity: Not Currently    Birth control/protection: Surgical  Other Topics Concern   Not on file  Social History Narrative   Not on file   Social Drivers of Health   Tobacco Use: High Risk (07/26/2024)   Patient History    Smoking Tobacco Use: Every Day    Smokeless Tobacco Use: Never    Passive Exposure: Not on file  Financial Resource Strain: Medium Risk (06/18/2024)   Overall Financial Resource Strain (CARDIA)    Difficulty of Paying Living Expenses: Somewhat hard  Food Insecurity: No Food Insecurity (07/19/2024)   Epic    Worried About Programme Researcher, Broadcasting/film/video in the Last Year: Never true    Ran Out of Food in the Last Year: Never true  Recent Concern: Food Insecurity - Food Insecurity Present (06/18/2024)   Epic    Worried About Programme Researcher, Broadcasting/film/video in the Last Year: Sometimes true    Ran Out of Food in the Last Year: Sometimes true  Transportation Needs: No Transportation Needs (07/19/2024)   Epic    Lack of Transportation (Medical): No    Lack of Transportation (Non-Medical): No  Physical Activity: Inactive (06/18/2024)   Exercise Vital Sign    Days of Exercise per Week: 0 days    Minutes of Exercise per Session: Not on file  Stress: No Stress Concern Present (06/18/2024)   [employer] of Occupational Health - Occupational Stress Questionnaire    Feeling of Stress: Only a little  Social Connections: Moderately Isolated (07/19/2024)   Social Connection and Isolation Panel    Frequency of Communication with Friends and Family: More than three times a  week    Frequency of Social Gatherings with Friends and Family: Once a week    Attends Religious Services: Never    Database Administrator or Organizations: No    Attends Banker Meetings: 1 to 4 times per year    Marital Status: Divorced  Intimate Partner Violence: Not At Risk (07/19/2024)   Epic    Fear of Current or Ex-Partner: No    Emotionally Abused: No    Physically Abused: No    Sexually Abused: No  Depression (PHQ2-9): Low Risk (07/02/2024)   Depression (PHQ2-9)    PHQ-2 Score: 0  Recent Concern: Depression (PHQ2-9) - Medium Risk (05/18/2024)   Depression (PHQ2-9)    PHQ-2 Score: 9  Alcohol Screen: Low Risk (06/18/2024)   Alcohol Screen    Last Alcohol Screening Score (AUDIT): 1  Housing: Low Risk (07/19/2024)   Epic    Unable to Pay for Housing in the  Last Year: No    Number of Times Moved in the Last Year: 0    Homeless in the Last Year: No  Utilities: Not At Risk (07/19/2024)   Epic    Threatened with loss of utilities: No  Health Literacy: Inadequate Health Literacy (08/07/2023)   B1300 Health Literacy    Frequency of need for help with medical instructions: Always    Physical Exam      Future Appointments  Date Time Provider Department Center  08/11/2024 11:00 AM [facility]M PFT ROOM [facility] [address]  08/11/2024  2:45 PM Kara Dorn NOVAK, MD [facility]PULCARE [address]  08/12/2024  2:40 PM Floretta Mallard, MD [facility] H&V  08/19/2024  4:10 PM Vicci Barnie NOVAK, MD [facility] [address]  05/06/2025  1:30 PM [facility] LAB [facility] None  05/06/2025  2:00 PM Onesimo Emaline Brink, MD [facility] None          [1]  Current Outpatient Medications:    albuterol  (VENTOLIN  HFA) 108 (90 Base) MCG/ACT inhaler, INHALE 2 PUFFS into THE lungs EVERY 6 HOURS AS NEEDED wheezing AND SHORTNESS OF BREATH, Disp: 54 g, Rfl: 1   clonazePAM  (KLONOPIN ) 0.5 MG tablet, Take 1 tablet (0.5 mg total) by mouth 2 (two) times daily as needed., Disp: 4 tablet, Rfl: 0    Fluticasone -Umeclidin-Vilant (TRELEGY ELLIPTA ) 200-62.5-25 MCG/ACT AEPB, Inhale 1 puff into the lungs daily., Disp: 60 each, Rfl: 11   OHTUVAYRE  3 MG/2.5ML SUSP, Take 1 vial by nebulization in the morning and at bedtime., Disp: , Rfl:    varenicline  (CHANTIX ) 1 MG tablet, Take 1 tablet (1 mg total) by mouth 2 (two) times daily., Disp: 60 tablet, Rfl: 3   acetaminophen  (TYLENOL ) 500 MG tablet, Take 1 tablet (500 mg total) by mouth every 6 (six) hours as needed for mild pain (pain score 1-3), fever or headache., Disp: , Rfl:    alendronate  (FOSAMAX ) 70 MG tablet, Take 1 tablet (70 mg total) by mouth every 7 (seven) days. Take with a full glass of water on an empty stomach., Disp: 12 tablet, Rfl: 0   aspirin  EC 81 MG tablet, Take 81 mg by mouth in the morning., Disp: , Rfl:    atorvastatin  (LIPITOR ) 80 MG tablet, Take 1 tablet (80 mg total) by mouth daily., Disp: 90 tablet, Rfl: 3   busPIRone  (BUSPAR ) 15 MG tablet, Take 2 tablets (30 mg total) by mouth in the morning AND 1 tablet (15 mg total) daily at 12 noon AND 1 tablet (15 mg total) every evening. (Patient taking differently: Take 60mg  by mouth at bedtime), Disp: 360 tablet, Rfl: 0   doxepin  (SINEQUAN ) 75 MG capsule, Take 1 capsule (75 mg total) by mouth at bedtime., Disp: 90 capsule, Rfl: 0   Dupilumab  (DUPIXENT ) 300 MG/2ML SOAJ, Inject 300 mg into the skin every 14 (fourteen) days., Disp: 4 mL, Rfl: 3   empagliflozin  (JARDIANCE ) 10 MG TABS tablet, Take 1 tablet (10 mg total) by mouth daily before breakfast., Disp: 90 tablet, Rfl: 3   escitalopram  (LEXAPRO ) 20 MG tablet, Take 1 tablet (20 mg total) by mouth daily., Disp: 90 tablet, Rfl: 0   ezetimibe  (ZETIA ) 10 MG tablet, Take 1 tablet (10 mg total) by mouth daily., Disp: 90 tablet, Rfl: 3   gabapentin  (NEURONTIN ) 600 MG tablet, Take 1 tablet (600 mg total) by mouth 2 (two) times daily., Disp: 180 tablet, Rfl: 2   guaiFENesin -dextromethorphan  (ROBITUSSIN DM) 100-10 MG/5ML syrup, Take 15 mLs by mouth  every 4 (four) hours as needed  for cough., Disp: 118 mL, Rfl: 0   hydrOXYzine  (ATARAX ) 25 MG tablet, Take 1 tablet (25 mg total) by mouth at bedtime as needed., Disp: 30 tablet, Rfl: 2   ipratropium-albuterol  (DUONEB) 0.5-2.5 (3) MG/3ML SOLN, Take 3 mLs by nebulization every 8 (eight) hours., Disp: 90 mL, Rfl: 1   levothyroxine  (SYNTHROID ) 112 MCG tablet, Take 1 tablet (112 mcg total) by mouth daily., Disp: 90 tablet, Rfl: 2   metFORMIN  (GLUCOPHAGE ) 500 MG tablet, Take 1 tablet (500 mg total) by mouth 2 (two) times daily with a meal. (Patient taking differently: Take 500 mg by mouth daily with breakfast.), Disp: 180 tablet, Rfl: 1   mirtazapine  (REMERON ) 45 MG tablet, Take 1 tablet (45 mg total) by mouth at bedtime., Disp: 90 tablet, Rfl: 0   montelukast  (SINGULAIR ) 10 MG tablet, Take 1 tablet (10 mg total) by mouth at bedtime., Disp: 90 tablet, Rfl: 2   nebivolol  (BYSTOLIC ) 10 MG tablet, Take 1 tablet (10 mg total) by mouth daily., Disp: 90 tablet, Rfl: 3   nitroGLYCERIN  (NITROSTAT ) 0.4 MG SL tablet, Place 1 tablet (0.4 mg total) under the tongue every 5 (five) minutes as needed for chest pain. (Patient not taking: Reported on 07/29/2024), Disp: 25 tablet, Rfl: 3   omeprazole  (PRILOSEC) 40 MG capsule, Take 1 capsule (40 mg total) by mouth 2 (two) times daily before a meal. NEED APPOINTMENT FOR ADDITIONAL REFILLS, Disp: 60 capsule, Rfl: 0   OXYGEN , Inhale 4 L/min into the lungs continuous., Disp: , Rfl:    predniSONE  (DELTASONE ) 10 MG tablet, Take 3 tablets (30 mg total) by mouth daily with breakfast for 2 days, THEN 2 tablets (20 mg total) daily with breakfast for 3 days, THEN 1 tablet (10 mg total) daily with breakfast for 3 days., Disp: 15 tablet, Rfl: 0   ticagrelor  (BRILINTA ) 60 MG TABS tablet, Take 1 tablet (60 mg total) by mouth 2 (two) times daily., Disp: 180 tablet, Rfl: 2   torsemide  (DEMADEX ) 20 MG tablet, Take 1-2 tablets (20-40 mg total) by mouth daily. Take 1 or 2 tablets, depending on  lower extremity edema and shortness of breath, Disp: , Rfl:    Vitamin D , Ergocalciferol , (DRISDOL ) 1.25 MG (50000 UNIT) CAPS capsule, Take 1 capsule (50,000 Units total) by mouth every 7 (seven) days., Disp: 12 capsule, Rfl: 0 [2]  Allergies Allergen Reactions   Avocado Anaphylaxis   Latex Shortness Of Breath and Rash   Codeine  Nausea Only

## 2024-08-04 NOTE — Telephone Encounter (Signed)
 Called Erin at [facility]. Verbal orders given for PT.

## 2024-08-05 ENCOUNTER — Telehealth (HOSPITAL_COMMUNITY): Payer: Self-pay

## 2024-08-05 ENCOUNTER — Other Ambulatory Visit (HOSPITAL_COMMUNITY): Payer: Self-pay | Admitting: Physician Assistant

## 2024-08-05 DIAGNOSIS — F411 Generalized anxiety disorder: Secondary | ICD-10-CM

## 2024-08-05 MED ORDER — CLONAZEPAM 0.5 MG PO TABS
0.5000 mg | ORAL_TABLET | Freq: Two times a day (BID) | ORAL | 0 refills | Status: AC | PRN
  Filled 2024-08-05 – 2024-08-06 (×2): qty 60, 30d supply, fill #0

## 2024-08-05 NOTE — Progress Notes (Signed)
 Provider was contacted by Aundra A. Trudy, RN regarding patient's request for medication refill. PDMP was reviewed prior to medication being refilled. Patient's medication to be e-prescribed.

## 2024-08-05 NOTE — Telephone Encounter (Signed)
 Patient's daughter called in requesting a refill of clonazePAM  (KLONOPIN ) 0.5 MG tablet. Patient needs to be seen by provider. Daughter was transferred to front desk for new provider appointment. Appointment set for 08/06/24.

## 2024-08-06 ENCOUNTER — Telehealth (HOSPITAL_COMMUNITY): Admitting: Physician Assistant

## 2024-08-06 ENCOUNTER — Encounter (HOSPITAL_COMMUNITY): Payer: Self-pay

## 2024-08-06 ENCOUNTER — Inpatient Hospital Stay (HOSPITAL_COMMUNITY)
Admission: EM | Admit: 2024-08-06 | Discharge: 2024-08-12 | DRG: 871 | Disposition: A | Discharge status: Home Health | Attending: Internal Medicine | Admitting: Internal Medicine

## 2024-08-06 ENCOUNTER — Emergency Department (HOSPITAL_COMMUNITY)

## 2024-08-06 ENCOUNTER — Other Ambulatory Visit (HOSPITAL_COMMUNITY): Payer: Self-pay

## 2024-08-06 ENCOUNTER — Other Ambulatory Visit: Payer: Self-pay

## 2024-08-06 ENCOUNTER — Encounter (HOSPITAL_COMMUNITY): Payer: Self-pay | Admitting: Family Medicine

## 2024-08-06 DIAGNOSIS — Z91040 Latex allergy status: Secondary | ICD-10-CM

## 2024-08-06 DIAGNOSIS — J440 Chronic obstructive pulmonary disease with acute lower respiratory infection: Secondary | ICD-10-CM | POA: Diagnosis present

## 2024-08-06 DIAGNOSIS — Z860100 Personal history of colon polyps, unspecified: Secondary | ICD-10-CM

## 2024-08-06 DIAGNOSIS — I10 Essential (primary) hypertension: Secondary | ICD-10-CM | POA: Diagnosis present

## 2024-08-06 DIAGNOSIS — F32A Depression, unspecified: Secondary | ICD-10-CM | POA: Diagnosis present

## 2024-08-06 DIAGNOSIS — Z7982 Long term (current) use of aspirin: Secondary | ICD-10-CM

## 2024-08-06 DIAGNOSIS — Z7951 Long term (current) use of inhaled steroids: Secondary | ICD-10-CM

## 2024-08-06 DIAGNOSIS — J441 Chronic obstructive pulmonary disease with (acute) exacerbation: Secondary | ICD-10-CM | POA: Diagnosis not present

## 2024-08-06 DIAGNOSIS — I252 Old myocardial infarction: Secondary | ICD-10-CM

## 2024-08-06 DIAGNOSIS — Z8249 Family history of ischemic heart disease and other diseases of the circulatory system: Secondary | ICD-10-CM

## 2024-08-06 DIAGNOSIS — J9621 Acute and chronic respiratory failure with hypoxia: Secondary | ICD-10-CM | POA: Diagnosis present

## 2024-08-06 DIAGNOSIS — E876 Hypokalemia: Secondary | ICD-10-CM | POA: Diagnosis not present

## 2024-08-06 DIAGNOSIS — Z885 Allergy status to narcotic agent status: Secondary | ICD-10-CM

## 2024-08-06 DIAGNOSIS — E872 Acidosis, unspecified: Secondary | ICD-10-CM | POA: Diagnosis present

## 2024-08-06 DIAGNOSIS — Z87440 Personal history of urinary (tract) infections: Secondary | ICD-10-CM

## 2024-08-06 DIAGNOSIS — J189 Pneumonia, unspecified organism: Secondary | ICD-10-CM | POA: Diagnosis not present

## 2024-08-06 DIAGNOSIS — A419 Sepsis, unspecified organism: Principal | ICD-10-CM | POA: Diagnosis present

## 2024-08-06 DIAGNOSIS — E039 Hypothyroidism, unspecified: Secondary | ICD-10-CM | POA: Diagnosis present

## 2024-08-06 DIAGNOSIS — Z90710 Acquired absence of both cervix and uterus: Secondary | ICD-10-CM

## 2024-08-06 DIAGNOSIS — I5032 Chronic diastolic (congestive) heart failure: Secondary | ICD-10-CM | POA: Diagnosis present

## 2024-08-06 DIAGNOSIS — I5030 Unspecified diastolic (congestive) heart failure: Secondary | ICD-10-CM | POA: Diagnosis present

## 2024-08-06 DIAGNOSIS — F419 Anxiety disorder, unspecified: Secondary | ICD-10-CM | POA: Diagnosis not present

## 2024-08-06 DIAGNOSIS — J9622 Acute and chronic respiratory failure with hypercapnia: Secondary | ICD-10-CM | POA: Diagnosis present

## 2024-08-06 DIAGNOSIS — E119 Type 2 diabetes mellitus without complications: Secondary | ICD-10-CM | POA: Diagnosis not present

## 2024-08-06 DIAGNOSIS — Z823 Family history of stroke: Secondary | ICD-10-CM

## 2024-08-06 DIAGNOSIS — J4489 Other specified chronic obstructive pulmonary disease: Secondary | ICD-10-CM

## 2024-08-06 DIAGNOSIS — Z83719 Family history of colon polyps, unspecified: Secondary | ICD-10-CM

## 2024-08-06 DIAGNOSIS — Z1152 Encounter for screening for COVID-19: Secondary | ICD-10-CM

## 2024-08-06 DIAGNOSIS — F17210 Nicotine dependence, cigarettes, uncomplicated: Secondary | ICD-10-CM | POA: Diagnosis present

## 2024-08-06 DIAGNOSIS — T380X5A Adverse effect of glucocorticoids and synthetic analogues, initial encounter: Secondary | ICD-10-CM | POA: Diagnosis not present

## 2024-08-06 DIAGNOSIS — Z79890 Hormone replacement therapy (postmenopausal): Secondary | ICD-10-CM

## 2024-08-06 DIAGNOSIS — Z803 Family history of malignant neoplasm of breast: Secondary | ICD-10-CM

## 2024-08-06 DIAGNOSIS — Z955 Presence of coronary angioplasty implant and graft: Secondary | ICD-10-CM

## 2024-08-06 DIAGNOSIS — Z808 Family history of malignant neoplasm of other organs or systems: Secondary | ICD-10-CM

## 2024-08-06 DIAGNOSIS — K219 Gastro-esophageal reflux disease without esophagitis: Secondary | ICD-10-CM | POA: Diagnosis present

## 2024-08-06 DIAGNOSIS — Z818 Family history of other mental and behavioral disorders: Secondary | ICD-10-CM

## 2024-08-06 DIAGNOSIS — E1165 Type 2 diabetes mellitus with hyperglycemia: Secondary | ICD-10-CM | POA: Diagnosis present

## 2024-08-06 DIAGNOSIS — N189 Chronic kidney disease, unspecified: Secondary | ICD-10-CM | POA: Diagnosis present

## 2024-08-06 DIAGNOSIS — E785 Hyperlipidemia, unspecified: Secondary | ICD-10-CM | POA: Diagnosis present

## 2024-08-06 DIAGNOSIS — E1122 Type 2 diabetes mellitus with diabetic chronic kidney disease: Secondary | ICD-10-CM | POA: Diagnosis present

## 2024-08-06 DIAGNOSIS — Z7902 Long term (current) use of antithrombotics/antiplatelets: Secondary | ICD-10-CM

## 2024-08-06 DIAGNOSIS — Z79899 Other long term (current) drug therapy: Secondary | ICD-10-CM

## 2024-08-06 DIAGNOSIS — Z91018 Allergy to other foods: Secondary | ICD-10-CM

## 2024-08-06 DIAGNOSIS — J9811 Atelectasis: Secondary | ICD-10-CM | POA: Diagnosis present

## 2024-08-06 DIAGNOSIS — Z7984 Long term (current) use of oral hypoglycemic drugs: Secondary | ICD-10-CM

## 2024-08-06 DIAGNOSIS — I959 Hypotension, unspecified: Secondary | ICD-10-CM | POA: Diagnosis present

## 2024-08-06 DIAGNOSIS — Z6831 Body mass index (BMI) 31.0-31.9, adult: Secondary | ICD-10-CM

## 2024-08-06 DIAGNOSIS — I130 Hypertensive heart and chronic kidney disease with heart failure and stage 1 through stage 4 chronic kidney disease, or unspecified chronic kidney disease: Secondary | ICD-10-CM | POA: Diagnosis present

## 2024-08-06 DIAGNOSIS — E66811 Obesity, class 1: Secondary | ICD-10-CM | POA: Diagnosis present

## 2024-08-06 DIAGNOSIS — Z833 Family history of diabetes mellitus: Secondary | ICD-10-CM

## 2024-08-06 DIAGNOSIS — L409 Psoriasis, unspecified: Secondary | ICD-10-CM | POA: Diagnosis present

## 2024-08-06 DIAGNOSIS — I2510 Atherosclerotic heart disease of native coronary artery without angina pectoris: Secondary | ICD-10-CM | POA: Diagnosis present

## 2024-08-06 DIAGNOSIS — Z794 Long term (current) use of insulin: Secondary | ICD-10-CM

## 2024-08-06 LAB — I-STAT CG4 LACTIC ACID, ED: Lactic Acid, Venous: 2.3 mmol/L (ref 0.5–1.9)

## 2024-08-06 LAB — CBC WITH DIFFERENTIAL/PLATELET
Abs Immature Granulocytes: 0.15 K/uL — ABNORMAL HIGH (ref 0.00–0.07)
Basophils Absolute: 0 K/uL (ref 0.0–0.1)
Basophils Relative: 0 %
Eosinophils Absolute: 0.1 K/uL (ref 0.0–0.5)
Eosinophils Relative: 1 %
HCT: 36.4 % (ref 36.0–46.0)
Hemoglobin: 11.7 g/dL — ABNORMAL LOW (ref 12.0–15.0)
Immature Granulocytes: 1 %
Lymphocytes Relative: 15 %
Lymphs Abs: 2.4 K/uL (ref 0.7–4.0)
MCH: 29 pg (ref 26.0–34.0)
MCHC: 32.1 g/dL (ref 30.0–36.0)
MCV: 90.3 fL (ref 80.0–100.0)
Monocytes Absolute: 1.5 K/uL — ABNORMAL HIGH (ref 0.1–1.0)
Monocytes Relative: 9 %
Neutro Abs: 12.4 K/uL — ABNORMAL HIGH (ref 1.7–7.7)
Neutrophils Relative %: 74 %
Platelets: 474 K/uL — ABNORMAL HIGH (ref 150–400)
RBC: 4.03 MIL/uL (ref 3.87–5.11)
RDW: 15.9 % — ABNORMAL HIGH (ref 11.5–15.5)
WBC: 16.6 K/uL — ABNORMAL HIGH (ref 4.0–10.5)
nRBC: 0 % (ref 0.0–0.2)

## 2024-08-06 LAB — COMPREHENSIVE METABOLIC PANEL WITH GFR
ALT: 36 U/L (ref 0–44)
AST: 18 U/L (ref 15–41)
Albumin: 4 g/dL (ref 3.5–5.0)
Alkaline Phosphatase: 86 U/L (ref 38–126)
Anion gap: 13 (ref 5–15)
BUN: 17 mg/dL (ref 8–23)
CO2: 35 mmol/L — ABNORMAL HIGH (ref 22–32)
Calcium: 9.2 mg/dL (ref 8.9–10.3)
Chloride: 89 mmol/L — ABNORMAL LOW (ref 98–111)
Creatinine, Ser: 1.08 mg/dL — ABNORMAL HIGH (ref 0.44–1.00)
GFR, Estimated: 56 mL/min — ABNORMAL LOW
Glucose, Bld: 113 mg/dL — ABNORMAL HIGH (ref 70–99)
Potassium: 2.9 mmol/L — ABNORMAL LOW (ref 3.5–5.1)
Sodium: 137 mmol/L (ref 135–145)
Total Bilirubin: 0.4 mg/dL (ref 0.0–1.2)
Total Protein: 6.9 g/dL (ref 6.5–8.1)

## 2024-08-06 LAB — MAGNESIUM: Magnesium: 1.7 mg/dL (ref 1.7–2.4)

## 2024-08-06 LAB — RESP PANEL BY RT-PCR (RSV, FLU A&B, COVID)  RVPGX2
Influenza A by PCR: NEGATIVE
Influenza B by PCR: NEGATIVE
Resp Syncytial Virus by PCR: NEGATIVE
SARS Coronavirus 2 by RT PCR: NEGATIVE

## 2024-08-06 LAB — GLUCOSE, CAPILLARY: Glucose-Capillary: 358 mg/dL — ABNORMAL HIGH (ref 70–99)

## 2024-08-06 LAB — I-STAT VENOUS BLOOD GAS, ED
Acid-Base Excess: 13 mmol/L — ABNORMAL HIGH (ref 0.0–2.0)
Bicarbonate: 37.4 mmol/L — ABNORMAL HIGH (ref 20.0–28.0)
Calcium, Ion: 1.03 mmol/L — ABNORMAL LOW (ref 1.15–1.40)
HCT: 37 % (ref 36.0–46.0)
Hemoglobin: 12.6 g/dL (ref 12.0–15.0)
O2 Saturation: 92 %
Potassium: 2.9 mmol/L — ABNORMAL LOW (ref 3.5–5.1)
Sodium: 134 mmol/L — ABNORMAL LOW (ref 135–145)
TCO2: 39 mmol/L — ABNORMAL HIGH (ref 22–32)
pCO2, Ven: 46.4 mmHg (ref 44–60)
pH, Ven: 7.514 — ABNORMAL HIGH (ref 7.25–7.43)
pO2, Ven: 59 mmHg — ABNORMAL HIGH (ref 32–45)

## 2024-08-06 LAB — PRO BRAIN NATRIURETIC PEPTIDE: Pro Brain Natriuretic Peptide: 236 pg/mL

## 2024-08-06 LAB — TROPONIN T, HIGH SENSITIVITY
Troponin T High Sensitivity: 21 ng/L — ABNORMAL HIGH (ref 0–19)
Troponin T High Sensitivity: 19 ng/L (ref 0–19)

## 2024-08-06 MED ORDER — ENOXAPARIN SODIUM 40 MG/0.4ML IJ SOSY
40.0000 mg | PREFILLED_SYRINGE | INTRAMUSCULAR | Status: DC
  Administered 2024-08-07 – 2024-08-12 (×6): 40 mg via SUBCUTANEOUS
  Filled 2024-08-06 (×6): qty 0.4

## 2024-08-06 MED ORDER — METHYLPREDNISOLONE SODIUM SUCC 125 MG IJ SOLR
125.0000 mg | Freq: Two times a day (BID) | INTRAMUSCULAR | Status: DC
  Administered 2024-08-07: 125 mg via INTRAVENOUS
  Filled 2024-08-06: qty 2

## 2024-08-06 MED ORDER — SENNOSIDES-DOCUSATE SODIUM 8.6-50 MG PO TABS: 1.0000 | ORAL_TABLET | Freq: Every evening | ORAL | Status: DC | PRN

## 2024-08-06 MED ORDER — LEVOTHYROXINE SODIUM 112 MCG PO TABS
112.0000 ug | ORAL_TABLET | Freq: Every day | ORAL | Status: DC
  Administered 2024-08-07 – 2024-08-12 (×6): 112 ug via ORAL
  Filled 2024-08-06 (×6): qty 1

## 2024-08-06 MED ORDER — DOXEPIN HCL 25 MG PO CAPS
75.0000 mg | ORAL_CAPSULE | Freq: Every day | ORAL | Status: DC
  Administered 2024-08-07 – 2024-08-11 (×4): 75 mg via ORAL
  Filled 2024-08-06 (×7): qty 3

## 2024-08-06 MED ORDER — BUSPIRONE HCL 5 MG PO TABS
15.0000 mg | ORAL_TABLET | Freq: Every day | ORAL | Status: DC
  Administered 2024-08-07 – 2024-08-11 (×5): 15 mg via ORAL
  Filled 2024-08-06 (×5): qty 3

## 2024-08-06 MED ORDER — DOXYCYCLINE HYCLATE 100 MG PO TABS
100.0000 mg | ORAL_TABLET | Freq: Two times a day (BID) | ORAL | Status: AC
  Administered 2024-08-06 – 2024-08-11 (×10): 100 mg via ORAL
  Filled 2024-08-06 (×10): qty 1

## 2024-08-06 MED ORDER — IPRATROPIUM-ALBUTEROL 0.5-2.5 (3) MG/3ML IN SOLN
3.0000 mL | Freq: Once | RESPIRATORY_TRACT | Status: AC
  Administered 2024-08-06: 3 mL via RESPIRATORY_TRACT

## 2024-08-06 MED ORDER — MAGNESIUM SULFATE 2 GM/50ML IV SOLN
2.0000 g | Freq: Once | INTRAVENOUS | Status: AC
  Administered 2024-08-06: 2 g via INTRAVENOUS
  Filled 2024-08-06: qty 50

## 2024-08-06 MED ORDER — ASPIRIN 81 MG PO TBEC
81.0000 mg | DELAYED_RELEASE_TABLET | Freq: Every day | ORAL | Status: DC
  Administered 2024-08-07 – 2024-08-12 (×6): 81 mg via ORAL
  Filled 2024-08-06 (×6): qty 1

## 2024-08-06 MED ORDER — METHYLPREDNISOLONE SODIUM SUCC 125 MG IJ SOLR
125.0000 mg | Freq: Once | INTRAMUSCULAR | Status: AC
  Administered 2024-08-06: 125 mg via INTRAVENOUS
  Filled 2024-08-06: qty 2

## 2024-08-06 MED ORDER — BUSPIRONE HCL 5 MG PO TABS
30.0000 mg | ORAL_TABLET | Freq: Every morning | ORAL | Status: DC
  Administered 2024-08-07 – 2024-08-12 (×6): 30 mg via ORAL
  Filled 2024-08-06 (×7): qty 6

## 2024-08-06 MED ORDER — ACETAMINOPHEN 325 MG PO TABS
650.0000 mg | ORAL_TABLET | Freq: Four times a day (QID) | ORAL | Status: DC | PRN
  Administered 2024-08-08 – 2024-08-12 (×4): 650 mg via ORAL
  Filled 2024-08-06 (×4): qty 2

## 2024-08-06 MED ORDER — INSULIN ASPART 100 UNIT/ML IJ SOLN
0.0000 [IU] | Freq: Every day | INTRAMUSCULAR | Status: DC
  Administered 2024-08-06: 5 [IU] via SUBCUTANEOUS
  Administered 2024-08-07 – 2024-08-08 (×2): 2 [IU] via SUBCUTANEOUS
  Administered 2024-08-09 – 2024-08-10 (×2): 3 [IU] via SUBCUTANEOUS
  Administered 2024-08-11: 2 [IU] via SUBCUTANEOUS
  Filled 2024-08-06: qty 1
  Filled 2024-08-06: qty 3
  Filled 2024-08-06: qty 1
  Filled 2024-08-06: qty 2
  Filled 2024-08-06 (×2): qty 1

## 2024-08-06 MED ORDER — POTASSIUM CHLORIDE CRYS ER 20 MEQ PO TBCR
40.0000 meq | EXTENDED_RELEASE_TABLET | Freq: Once | ORAL | Status: AC
  Administered 2024-08-06: 40 meq via ORAL
  Filled 2024-08-06: qty 2

## 2024-08-06 MED ORDER — FUROSEMIDE 10 MG/ML IJ SOLN
40.0000 mg | Freq: Once | INTRAMUSCULAR | Status: AC
  Administered 2024-08-06: 40 mg via INTRAVENOUS
  Filled 2024-08-06: qty 4

## 2024-08-06 MED ORDER — ATORVASTATIN CALCIUM 80 MG PO TABS
80.0000 mg | ORAL_TABLET | Freq: Every day | ORAL | Status: DC
  Administered 2024-08-07 – 2024-08-12 (×6): 80 mg via ORAL
  Filled 2024-08-06 (×6): qty 1

## 2024-08-06 MED ORDER — ESCITALOPRAM OXALATE 20 MG PO TABS
20.0000 mg | ORAL_TABLET | Freq: Every day | ORAL | Status: DC
  Administered 2024-08-07 – 2024-08-12 (×6): 20 mg via ORAL
  Filled 2024-08-06 (×6): qty 1

## 2024-08-06 MED ORDER — INSULIN ASPART 100 UNIT/ML IJ SOLN
0.0000 [IU] | Freq: Three times a day (TID) | INTRAMUSCULAR | Status: DC
  Administered 2024-08-07: 1 [IU] via SUBCUTANEOUS
  Administered 2024-08-07: 2 [IU] via SUBCUTANEOUS
  Administered 2024-08-07: 1 [IU] via SUBCUTANEOUS
  Administered 2024-08-08 (×2): 2 [IU] via SUBCUTANEOUS
  Administered 2024-08-09 (×2): 1 [IU] via SUBCUTANEOUS
  Administered 2024-08-09: 4 [IU] via SUBCUTANEOUS
  Administered 2024-08-10: 2 [IU] via SUBCUTANEOUS
  Administered 2024-08-10: 5 [IU] via SUBCUTANEOUS
  Administered 2024-08-10: 4 [IU] via SUBCUTANEOUS
  Filled 2024-08-06 (×2): qty 1
  Filled 2024-08-06: qty 4
  Filled 2024-08-06 (×2): qty 1
  Filled 2024-08-06: qty 2
  Filled 2024-08-06: qty 1
  Filled 2024-08-06: qty 2
  Filled 2024-08-06: qty 3
  Filled 2024-08-06: qty 2

## 2024-08-06 MED ORDER — ACETAMINOPHEN 650 MG RE SUPP: 650.0000 mg | Freq: Four times a day (QID) | RECTAL | Status: DC | PRN

## 2024-08-06 MED ORDER — PANTOPRAZOLE SODIUM 40 MG PO TBEC
40.0000 mg | DELAYED_RELEASE_TABLET | Freq: Two times a day (BID) | ORAL | Status: DC
  Administered 2024-08-07 – 2024-08-12 (×11): 40 mg via ORAL
  Filled 2024-08-06 (×11): qty 1

## 2024-08-06 MED ORDER — CLONAZEPAM 0.5 MG PO TABS
0.5000 mg | ORAL_TABLET | Freq: Two times a day (BID) | ORAL | Status: DC | PRN
  Administered 2024-08-06 – 2024-08-12 (×7): 0.5 mg via ORAL
  Filled 2024-08-06 (×7): qty 1

## 2024-08-06 MED ORDER — SODIUM CHLORIDE 0.9% FLUSH
3.0000 mL | Freq: Two times a day (BID) | INTRAVENOUS | Status: DC
  Administered 2024-08-07 – 2024-08-12 (×12): 3 mL via INTRAVENOUS

## 2024-08-06 MED ORDER — TORSEMIDE 20 MG PO TABS
20.0000 mg | ORAL_TABLET | Freq: Every day | ORAL | Status: DC
  Administered 2024-08-07 – 2024-08-08 (×2): 20 mg via ORAL
  Filled 2024-08-06 (×2): qty 1

## 2024-08-06 MED ORDER — GUAIFENESIN 100 MG/5ML PO LIQD
5.0000 mL | ORAL | Status: DC | PRN
  Administered 2024-08-07 – 2024-08-10 (×3): 5 mL via ORAL
  Filled 2024-08-06 (×4): qty 5

## 2024-08-06 MED ORDER — PREDNISONE 20 MG PO TABS: 40.0000 mg | ORAL_TABLET | Freq: Every day | ORAL | Status: DC

## 2024-08-06 MED ORDER — BUSPIRONE HCL 5 MG PO TABS
15.0000 mg | ORAL_TABLET | Freq: Every evening | ORAL | Status: DC
  Administered 2024-08-06 – 2024-08-11 (×6): 15 mg via ORAL
  Filled 2024-08-06 (×6): qty 3

## 2024-08-06 MED ORDER — SODIUM CHLORIDE 0.9 % IV SOLN
2.0000 g | INTRAVENOUS | Status: AC
  Administered 2024-08-07 – 2024-08-10 (×5): 2 g via INTRAVENOUS
  Filled 2024-08-06 (×5): qty 20

## 2024-08-06 MED ORDER — TICAGRELOR 60 MG PO TABS
60.0000 mg | ORAL_TABLET | Freq: Two times a day (BID) | ORAL | Status: DC
  Administered 2024-08-07 – 2024-08-12 (×11): 60 mg via ORAL
  Filled 2024-08-06 (×13): qty 1

## 2024-08-06 MED ORDER — GABAPENTIN 300 MG PO CAPS
600.0000 mg | ORAL_CAPSULE | Freq: Two times a day (BID) | ORAL | Status: DC
  Administered 2024-08-06 – 2024-08-12 (×12): 600 mg via ORAL
  Filled 2024-08-06 (×12): qty 2

## 2024-08-06 MED ORDER — IPRATROPIUM-ALBUTEROL 0.5-2.5 (3) MG/3ML IN SOLN
3.0000 mL | RESPIRATORY_TRACT | Status: DC | PRN
  Administered 2024-08-10: 3 mL via RESPIRATORY_TRACT
  Filled 2024-08-06: qty 3

## 2024-08-06 MED ORDER — IPRATROPIUM-ALBUTEROL 0.5-2.5 (3) MG/3ML IN SOLN
3.0000 mL | Freq: Three times a day (TID) | RESPIRATORY_TRACT | Status: DC
  Administered 2024-08-07 – 2024-08-12 (×10): 3 mL via RESPIRATORY_TRACT
  Filled 2024-08-06 (×10): qty 3

## 2024-08-06 MED ORDER — POTASSIUM CHLORIDE 10 MEQ/100ML IV SOLN
10.0000 meq | Freq: Once | INTRAVENOUS | Status: AC
  Administered 2024-08-06: 10 meq via INTRAVENOUS
  Filled 2024-08-06: qty 100

## 2024-08-06 MED ORDER — NEBIVOLOL HCL 10 MG PO TABS
10.0000 mg | ORAL_TABLET | Freq: Every day | ORAL | Status: DC
  Administered 2024-08-07 – 2024-08-12 (×6): 10 mg via ORAL
  Filled 2024-08-06 (×6): qty 1

## 2024-08-06 MED ORDER — IPRATROPIUM-ALBUTEROL 0.5-2.5 (3) MG/3ML IN SOLN
3.0000 mL | Freq: Once | RESPIRATORY_TRACT | Status: AC
  Administered 2024-08-06: 3 mL via RESPIRATORY_TRACT
  Filled 2024-08-06: qty 3

## 2024-08-06 MED ORDER — MIRTAZAPINE 15 MG PO TABS
45.0000 mg | ORAL_TABLET | Freq: Every day | ORAL | Status: DC
  Administered 2024-08-06 – 2024-08-11 (×6): 45 mg via ORAL
  Filled 2024-08-06 (×6): qty 3

## 2024-08-06 MED ORDER — IOHEXOL 350 MG/ML SOLN
75.0000 mL | Freq: Once | INTRAVENOUS | Status: AC | PRN
  Administered 2024-08-06: 75 mL via INTRAVENOUS

## 2024-08-06 MED ORDER — MONTELUKAST SODIUM 10 MG PO TABS
10.0000 mg | ORAL_TABLET | Freq: Every day | ORAL | Status: DC
  Administered 2024-08-06 – 2024-08-11 (×6): 10 mg via ORAL
  Filled 2024-08-06 (×6): qty 1

## 2024-08-06 NOTE — ED Notes (Addendum)
 Ambulated a few feet around the nurses desk. Sats stayed above 90 while on 3L [place] started getting lightheaded with an increase work of breathing. PA Donnajean made aware

## 2024-08-06 NOTE — ED Provider Notes (Signed)
 " [place] [facility] Provider Note   CSN: [id number] Arrival date & time: 08/06/24  8367     Patient presents with: Hypotension and Shortness of Breath   [name] [name] is a 69 y.o. female with history of acute on chronic respiratory failure, COPD on 4 L nasal cannula baseline, HFpEF, hyperlipidemia, hypertension presents with complaints of shortness of breath and cough.  States that she took her blood pressure today and was noted to be hypotensive as well.  Denies any fevers.  Had some chest pain earlier upon arrival this is since resolved.  She denies any abdominal pain, vomiting or diarrhea.  Had a recent admission and discharged on 1/12 for acute on chronic respiratory failure in the setting of COPD exacerbation.    Shortness of Breath     Past Medical History:  Diagnosis Date   Allergy    seasonal   Anemia    Anxiety    takes Lexapro  daily   Arthritis    back, from neck down pass my bra area (03/25/2017)   Asthma    Bartholin gland cyst 08/29/2011   Bruises easily    pt is on Effient    Chronic back pain    herniated nucleus pulposus   Chronic back pain    neck to bra area; lower back (03/25/2017)   Chronic kidney disease    recurrent UTI's this year 2022   COPD (chronic obstructive pulmonary disease) (HCC)    early stages   Coronary artery disease    Depression    takes Klonopin  daily   Diabetes mellitus without complication (HCC)    Diverticulosis    Fibroadenoma of left breast    GERD (gastroesophageal reflux disease)    takes Nexium  daily   H/O hiatal hernia    Heart attack (HCC) 2011   Hemorrhoids    Hernia    Hyperlipidemia    takes Lipitor  daily   Hypertension    takes Losartan  daily and Labetalol  bid   Hypothyroidism    takes Synthroid  daily   Insomnia    hydroxyzine  prn   Joint pain    Pneumonia    couple times (03/25/2017)   Pre-diabetes    just found out 1 wk ago (03/25/2017)   Psoriasis     elbows,knees,back   QT prolongation 03/25/2017   S/P abdominal hysterectomy and right salpingo-oophorectomy 08/29/2011   Left ovary intact. Non-cancerous: discontinue paps     Shortness of breath    with exertion   Slowing of urinary stream    Stress incontinence    Past Surgical History:  Procedure Laterality Date   ABDOMINAL EXPLORATION SURGERY  1977   ABDOMINAL HYSTERECTOMY  1977   left one of my ovaries   BACK SURGERY     BIOPSY  05/27/2019   Procedure: BIOPSY;  Surgeon: Shaaron Lamar HERO, MD;  Location: [facility];  Service: Endoscopy;;   BREAST BIOPSY Left 11/2021   x2   COLONOSCOPY  2011   Dr. Teressa: Mild diverticulosis, descending diminutive colon polyp (not retrieved), next colonoscopy 10 years   CORONARY ANGIOPLASTY WITH STENT PLACEMENT  2011 X2   regular stents didn't work; had to go back in in ~ 1 month and put in medicated stents   DILATION AND CURETTAGE OF UTERUS     ESOPHAGOGASTRODUODENOSCOPY     ESOPHAGOGASTRODUODENOSCOPY (EGD) WITH PROPOFOL  N/A 05/27/2019   normal esophagus, dilation, erosive gastropathy s/p biopsy, normal duodenum. Negative H.pylori.    LEFT  HEART CATH AND CORONARY ANGIOGRAPHY N/A 03/26/2017   Procedure: LEFT HEART CATH AND CORONARY ANGIOGRAPHY;  Surgeon: Claudene Victory ORN, MD;  Location: [facility] INVASIVE CV LAB;  Service: Cardiovascular;  Laterality: N/A;   LUMBAR LAMINECTOMY/DECOMPRESSION MICRODISCECTOMY  08/05/2011   Procedure: LUMBAR LAMINECTOMY/DECOMPRESSION MICRODISCECTOMY;  Surgeon: Lynwood JONELLE Mill, MD;  Location: [facility] NEURO ORS;  Service: Neurosurgery;  Laterality: Right;  Right Lumbar four-five extraforaminal discectomy   MALONEY DILATION N/A 05/27/2019   Procedure: AGAPITO DILATION;  Surgeon: Shaaron Lamar HERO, MD;  Location: [facility];  Service: Endoscopy;  Laterality: N/A;  54   SHOULDER ARTHROSCOPY WITH ROTATOR CUFF REPAIR AND OPEN BICEPS TENODESIS Right 12/24/2021   Procedure: RIGHT SHOULDER ARTHROSCOPY WITH ROTATOR CUFF REPAIR AND  BICEPS TENODESIS;  Surgeon: Barbarann Oneil BROCKS, MD;  Location: [facility] OR;  Service: Orthopedics;  Laterality: Right;   TONSILLECTOMY     as a child   UPPER GASTROINTESTINAL ENDOSCOPY       Prior to Admission medications  Medication Sig Start Date End Date Taking? Authorizing Provider  acetaminophen  (TYLENOL ) 500 MG tablet Take 1 tablet (500 mg total) by mouth every 6 (six) hours as needed for mild pain (pain score 1-3), fever or headache. 06/13/24   Arlice Reichert, MD  albuterol  (VENTOLIN  HFA) 108 (90 Base) MCG/ACT inhaler INHALE 2 PUFFS into THE lungs EVERY 6 HOURS AS NEEDED wheezing AND SHORTNESS OF BREATH 10/15/23   Vicci Barnie NOVAK, MD  alendronate  (FOSAMAX ) 70 MG tablet Take 1 tablet (70 mg total) by mouth every 7 (seven) days. Take with a full glass of water on an empty stomach. 04/06/24   Vicci Barnie NOVAK, MD  aspirin  EC 81 MG tablet Take 81 mg by mouth in the morning.    [provider]  atorvastatin  (LIPITOR ) 80 MG tablet Take 1 tablet (80 mg total) by mouth daily. 09/22/23   Daneen Damien BROCKS, NP  busPIRone  (BUSPAR ) 15 MG tablet Take 2 tablets (30 mg total) by mouth in the morning AND 1 tablet (15 mg total) daily at 12 noon AND 1 tablet (15 mg total) every evening. Patient taking differently: Take 60mg  by mouth at bedtime 05/18/24   Nwoko, Uchenna E, PA  clonazePAM  (KLONOPIN ) 0.5 MG tablet Take 1 tablet (0.5 mg total) by mouth 2 (two) times daily as needed. 08/05/24 08/05/25  Nwoko, Uchenna E, PA  doxepin  (SINEQUAN ) 75 MG capsule Take 1 capsule (75 mg total) by mouth at bedtime. 05/18/24   Nwoko, Uchenna E, PA  Dupilumab  (DUPIXENT ) 300 MG/2ML SOAJ Inject 300 mg into the skin every 14 (fourteen) days. 05/26/24   Kara Dorn NOVAK, MD  empagliflozin  (JARDIANCE ) 10 MG TABS tablet Take 1 tablet (10 mg total) by mouth daily before breakfast. 07/13/24   Floretta Mallard, MD  escitalopram  (LEXAPRO ) 20 MG tablet Take 1 tablet (20 mg total) by mouth daily. 05/18/24   Nwoko, Uchenna E, PA  ezetimibe   (ZETIA ) 10 MG tablet Take 1 tablet (10 mg total) by mouth daily. 06/21/24   Meng, Hao, PA  Fluticasone -Umeclidin-Vilant (TRELEGY ELLIPTA ) 200-62.5-25 MCG/ACT AEPB Inhale 1 puff into the lungs daily. 07/02/24   Kara Dorn NOVAK, MD  gabapentin  (NEURONTIN ) 600 MG tablet Take 1 tablet (600 mg total) by mouth 2 (two) times daily. 12/04/23   Vicci Barnie NOVAK, MD  guaiFENesin -dextromethorphan  (ROBITUSSIN DM) 100-10 MG/5ML syrup Take 15 mLs by mouth every 4 (four) hours as needed for cough. 07/26/24   Von Bellis, MD  hydrOXYzine  (ATARAX ) 25 MG tablet Take 1 tablet (25 mg total)  by mouth at bedtime as needed. 05/18/24   Nwoko, Uchenna E, PA  ipratropium-albuterol  (DUONEB) 0.5-2.5 (3) MG/3ML SOLN Take 3 mLs by nebulization every 8 (eight) hours. 01/15/24   Kara Dorn NOVAK, MD  levothyroxine  (SYNTHROID ) 112 MCG tablet Take 1 tablet (112 mcg total) by mouth daily. 01/22/24   Vicci Barnie NOVAK, MD  metFORMIN  (GLUCOPHAGE ) 500 MG tablet Take 1 tablet (500 mg total) by mouth 2 (two) times daily with a meal. Patient taking differently: Take 500 mg by mouth daily with breakfast. 03/12/24   Vicci Barnie NOVAK, MD  mirtazapine  (REMERON ) 45 MG tablet Take 1 tablet (45 mg total) by mouth at bedtime. 05/18/24   Nwoko, Uchenna E, PA  montelukast  (SINGULAIR ) 10 MG tablet Take 1 tablet (10 mg total) by mouth at bedtime. 11/11/23   Vicci Barnie NOVAK, MD  nebivolol  (BYSTOLIC ) 10 MG tablet Take 1 tablet (10 mg total) by mouth daily. 09/22/23   Daneen Damien BROCKS, NP  nitroGLYCERIN  (NITROSTAT ) 0.4 MG SL tablet Place 1 tablet (0.4 mg total) under the tongue every 5 (five) minutes as needed for chest pain. Patient not taking: Reported on 07/29/2024 04/15/24 07/19/25  Daneen Damien BROCKS, NP  OHTUVAYRE  3 MG/2.5ML SUSP Take 1 vial by nebulization in the morning and at bedtime. 05/19/24   [provider]  omeprazole  (PRILOSEC) 40 MG capsule Take 1 capsule (40 mg total) by mouth 2 (two) times daily before a meal. NEED APPOINTMENT FOR  ADDITIONAL REFILLS 04/21/24   Legrand Victory LITTIE DOUGLAS, MD  OXYGEN  Inhale 4 L/min into the lungs continuous.    [provider]  ticagrelor  (BRILINTA ) 60 MG TABS tablet Take 1 tablet (60 mg total) by mouth 2 (two) times daily. 12/11/23   Burnard Debby LABOR, MD  torsemide  (DEMADEX ) 20 MG tablet Take 1-2 tablets (20-40 mg total) by mouth daily. Take 1 or 2 tablets, depending on lower extremity edema and shortness of breath 07/26/24   Von Bellis, MD  varenicline  (CHANTIX ) 1 MG tablet Take 1 tablet (1 mg total) by mouth 2 (two) times daily. 04/21/24   Kara Dorn NOVAK, MD  Vitamin D , Ergocalciferol , (DRISDOL ) 1.25 MG (50000 UNIT) CAPS capsule Take 1 capsule (50,000 Units total) by mouth every 7 (seven) days. 08/01/24 10/30/24  Von Bellis, MD    Allergies: Avocado, Latex, and Codeine     Review of Systems  Respiratory:  Positive for shortness of breath.     Updated Vital Signs BP 136/75 (BP Location: Left Arm)   Pulse 88   Temp 98.9 F (37.2 C) (Oral)   Resp 17   SpO2 99%   Physical Exam Vitals and nursing note reviewed.  Constitutional:      General: She is not in acute distress.    Appearance: She is well-developed.  HENT:     Head: Normocephalic and atraumatic.  Eyes:     Conjunctiva/sclera: Conjunctivae normal.  Cardiovascular:     Rate and Rhythm: Normal rate and regular rhythm.     Heart sounds: No murmur heard. Pulmonary:     Effort: Pulmonary effort is normal. No respiratory distress.     Breath sounds: Normal breath sounds.  Abdominal:     Palpations: Abdomen is soft.     Tenderness: There is no abdominal tenderness.  Musculoskeletal:        General: No swelling.     Cervical back: Neck supple.  Skin:    General: Skin is warm and dry.     Capillary Refill: Capillary refill takes less  than 2 seconds.  Neurological:     Mental Status: She is alert.  Psychiatric:        Mood and Affect: Mood normal.     (all labs ordered are listed, but only abnormal results are  displayed) Labs Reviewed  CBC WITH DIFFERENTIAL/PLATELET - Abnormal; Notable for the following components:      Result Value   WBC 16.6 (*)    Hemoglobin 11.7 (*)    RDW 15.9 (*)    Platelets 474 (*)    Neutro Abs 12.4 (*)    Monocytes Absolute 1.5 (*)    Abs Immature Granulocytes 0.15 (*)    All other components within normal limits  COMPREHENSIVE METABOLIC PANEL WITH GFR - Abnormal; Notable for the following components:   Potassium 2.9 (*)    Chloride 89 (*)    CO2 35 (*)    Glucose, Bld 113 (*)    Creatinine, Ser 1.08 (*)    GFR, Estimated 56 (*)    All other components within normal limits  I-STAT CG4 LACTIC ACID, ED - Abnormal; Notable for the following components:   Lactic Acid, Venous 2.3 (*)    All other components within normal limits  I-STAT VENOUS BLOOD GAS, ED - Abnormal; Notable for the following components:   pH, Ven 7.514 (*)    pO2, Ven 59 (*)    Bicarbonate 37.4 (*)    TCO2 39 (*)    Acid-Base Excess 13.0 (*)    Sodium 134 (*)    Potassium 2.9 (*)    Calcium , Ion 1.03 (*)    All other components within normal limits  TROPONIN T, HIGH SENSITIVITY - Abnormal; Notable for the following components:   Troponin T High Sensitivity 21 (*)    All other components within normal limits  RESP PANEL BY RT-PCR (RSV, FLU A&B, COVID)  RVPGX2  CULTURE, BLOOD (ROUTINE X 2)  CULTURE, BLOOD (ROUTINE X 2)  PRO BRAIN NATRIURETIC PEPTIDE  MAGNESIUM   I-STAT CG4 LACTIC ACID, ED  TROPONIN T, HIGH SENSITIVITY    EKG: None  Radiology: DG Chest Portable 1 View Result Date: 08/06/2024 EXAM: 1 VIEW(S) XRAY OF THE CHEST 08/06/2024 05:56:00 PM COMPARISON: 07/18/2024 CLINICAL HISTORY: Shortness of breath. FINDINGS: LUNGS AND PLEURA: Low lung volumes. Streaky bibasilar opacities. No pleural effusion. No pneumothorax. HEART AND MEDIASTINUM: Aortic atherosclerosis. The cardiac silhouette is unremarkable. BONES AND SOFT TISSUES: Chronic left clavicle fracture. IMPRESSION: 1. Mild  bibasilar atelectasis, new from the prior exam. Electronically signed by: Oneil Devonshire MD 08/06/2024 06:16 PM EST RP Workstation: [id number]     Procedures   Medications Ordered in the ED  potassium chloride  10 mEq in 100 mL IVPB (10 mEq Intravenous New Bag/Given 08/06/24 2113)  methylPREDNISolone  sodium succinate (SOLU-MEDROL ) 125 mg/2 mL injection 125 mg (125 mg Intravenous Given 08/06/24 1809)  ipratropium-albuterol  (DUONEB) 0.5-2.5 (3) MG/3ML nebulizer solution 3 mL (3 mLs Nebulization Given 08/06/24 1824)  magnesium  sulfate IVPB 2 g 50 mL (0 g Intravenous Stopped 08/06/24 1919)  furosemide  (LASIX ) injection 40 mg (40 mg Intravenous Given 08/06/24 2040)  ipratropium-albuterol  (DUONEB) 0.5-2.5 (3) MG/3ML nebulizer solution 3 mL (3 mLs Nebulization Given 08/06/24 1833)  potassium chloride  SA (KLOR-CON  M) CR tablet 40 mEq (40 mEq Oral Given 08/06/24 2110)    Clinical Course as of 08/06/24 2154  Fri Aug 06, 2024  1724 Patient with history of COPD on 4 L nasal cannula, HFpEF and multiple cardiovascular risk factors evaluated for shortness of breath and cough that started last  night and hypotension that she noted earlier today.  Upon arrival patient is hypotensive 89/55, and tachypneic.  Lung sounds are diminished with diffuse rhonchi.  Will begin broad workup [JT]  1816 Patient with a history of heart failure and COPD on 4 L oxygen  at home presented to ED with worsening shortness of breath.  Patient has a rhonchorous breath sounds on arrival but speaking full sentences and is satting well on her baseline 4 L nasal cannula, although she did arrive on initially high flow nasal cannula this was weaned rapidly down.  Likely this is a combination of COPD and congestive heart failure.  She does not have peripheral edema but generally does not retain fluid in her legs she reports.  I think she needs treatment of both COPD and CHF, and we have ordered treatments including diuresis.  Her blood pressure is low but  normal for her, blood pressure SBP 110 mmhg at this time in the room [MT]  1932 CBC with Differential(!) Significant leukocytosis of 16.6, hemoglobin stable [JT]  1933 Troponin T, High Sensitivity(!) Mildly elevated will trend [JT]  1933 Magnesium  Within normal limits [JT]  1933 Comprehensive metabolic panel(!) Mild hypokalemia of 2.9, will replete, bicarb of 35 [JT]  1933 DG Chest Portable 1 View Mild bibasilar atelectasis [JT]  2140 Troponin T, High Sensitivity Repeat without elevation [JT]  2153 Discussed patient with Dr. Charlton, agreed for admission for COPD exacerbation.  Patient does have a leukocytosis however this appears chronic and she is on steroids.  Lactic is mildly elevated however she has been receiving albuterol .  Vitals are stable at this point.  Do not suspect sepsis.  She does have new bibasilar atelectasis.  Will obtain CT scan to evaluate for pneumonia and PE as she did have a mildly elevated troponin. [JT]    Clinical Course User Index [JT] Donnajean Lynwood DEL, PA-C [MT] Cottie Donnice PARAS, MD                                 Medical Decision Making Amount and/or Complexity of Data Reviewed Labs: ordered. Decision-making details documented in ED Course. Radiology: ordered. Decision-making details documented in ED Course.  Risk Prescription drug management.   This patient presents to the ED with chief complaint(s) of Shortness of breath.  The complaint involves an extensive differential diagnosis and also carries with it a high risk of complications and morbidity.   Pertinent past medical history as listed in HPI  The differential diagnosis includes  ACS, PE, pneumonia, COPD exacerbation, CHF Additional history obtained: Records reviewed Care Everywhere/External Records  Disposition:   Patient admitted  Social Determinants of Health:   none  This note was dictated with voice recognition software.  Despite best efforts at proofreading, errors may have  occurred which can change the documentation meaning.       Final diagnoses:  COPD exacerbation [facility])    ED Discharge Orders     None          Donnajean Lynwood DEL DEVONNA 08/06/24 2154    Cottie Donnice PARAS, MD 08/06/24 2322  "

## 2024-08-06 NOTE — ED Triage Notes (Signed)
 BIB [facility] from home c/o shortness of breath & hypotension.  Dizziness. 3L at baseline. Rhonchi & wheezing. 3 duo nebs. 104 systolic , orthostatics 71/60

## 2024-08-06 NOTE — H&P (Signed)
 " History and Physical    [name] [name] FMW:[id number] DOB: 31-Aug-1955 DOA: 08/06/2024  PCP: Vicci Barnie [name], MD   Patient coming from: Home   Chief Complaint: SOB, cough  HPI: [name] [name] is a 69 y.o. female with medical history significant for asthma COPD overlap, chronic hypoxic and hypercarbic respiratory failure, chronic HFpEF, CAD, depression, anxiety, type 2 diabetes mellitus, and hypothyroidism who presents with cough and shortness of breath.  Patient began feeling generally poor last night with increased fatigue, increased shortness of breath, and cough.  She denies any fever, chills, or chest pain associated with this.  ED Course: Upon arrival to the ED, patient is found to be afebrile and saturating well on 3 L/min of supplemental oxygen  with mild tachypnea, normal HR, and SBP 89 and greater.  Labs are most notable for potassium 2.9, serum bicarbonate 35, creatinine 1.08, WBC 16,600, lactic acid 2.3, normal BNP, and troponin 21.  Chest x-ray demonstrates mild bibasilar atelectasis.  Blood cultures were collected in the ED and the patient was given oral and IV potassium, IV magnesium , IV Solu-Medrol , and IV Lasix .  CTA chest was ordered but not yet performed and hospitalists were asked to admit.  Review of Systems:  All other systems reviewed and apart from HPI, are negative.  Past Medical History:  Diagnosis Date   Allergy    seasonal   Anemia    Anxiety    takes Lexapro  daily   Arthritis    back, from neck down pass my bra area (03/25/2017)   Asthma    Bartholin gland cyst 08/29/2011   Bruises easily    pt is on Effient    Chronic back pain    herniated nucleus pulposus   Chronic back pain    neck to bra area; lower back (03/25/2017)   Chronic kidney disease    recurrent UTI's this year 2022   COPD (chronic obstructive pulmonary disease) (HCC)    early stages   Coronary artery disease    Depression    takes Klonopin  daily   Diabetes mellitus without  complication (HCC)    Diverticulosis    Fibroadenoma of left breast    GERD (gastroesophageal reflux disease)    takes Nexium  daily   H/O hiatal hernia    Heart attack (HCC) 2011   Hemorrhoids    Hernia    Hyperlipidemia    takes Lipitor  daily   Hypertension    takes Losartan  daily and Labetalol  bid   Hypothyroidism    takes Synthroid  daily   Insomnia    hydroxyzine  prn   Joint pain    Pneumonia    couple times (03/25/2017)   Pre-diabetes    just found out 1 wk ago (03/25/2017)   Psoriasis    elbows,knees,back   QT prolongation 03/25/2017   S/P abdominal hysterectomy and right salpingo-oophorectomy 08/29/2011   Left ovary intact. Non-cancerous: discontinue paps     Shortness of breath    with exertion   Slowing of urinary stream    Stress incontinence     Past Surgical History:  Procedure Laterality Date   ABDOMINAL EXPLORATION SURGERY  1977   ABDOMINAL HYSTERECTOMY  1977   left one of my ovaries   BACK SURGERY     BIOPSY  05/27/2019   Procedure: BIOPSY;  Surgeon: Shaaron Lamar HERO, MD;  Location: [facility];  Service: Endoscopy;;   BREAST BIOPSY Left 11/2021   x2   COLONOSCOPY  2011   Dr. Teressa: Mild  diverticulosis, descending diminutive colon polyp (not retrieved), next colonoscopy 10 years   CORONARY ANGIOPLASTY WITH STENT PLACEMENT  2011 X2   regular stents didn't work; had to go back in in ~ 1 month and put in medicated stents   DILATION AND CURETTAGE OF UTERUS     ESOPHAGOGASTRODUODENOSCOPY     ESOPHAGOGASTRODUODENOSCOPY (EGD) WITH PROPOFOL  N/A 05/27/2019   normal esophagus, dilation, erosive gastropathy s/p biopsy, normal duodenum. Negative H.pylori.    LEFT HEART CATH AND CORONARY ANGIOGRAPHY N/A 03/26/2017   Procedure: LEFT HEART CATH AND CORONARY ANGIOGRAPHY;  Surgeon: Claudene Victory ORN, MD;  Location: [facility];  Service: Cardiovascular;  Laterality: N/A;   LUMBAR LAMINECTOMY/DECOMPRESSION MICRODISCECTOMY  08/05/2011   Procedure: LUMBAR  LAMINECTOMY/DECOMPRESSION MICRODISCECTOMY;  Surgeon: Lynwood JONELLE Mill, MD;  Location: [facility];  Service: Neurosurgery;  Laterality: Right;  Right Lumbar four-five extraforaminal discectomy   MALONEY DILATION N/A 05/27/2019   Procedure: AGAPITO DILATION;  Surgeon: Shaaron Lamar HERO, MD;  Location: [facility];  Service: Endoscopy;  Laterality: N/A;  54   SHOULDER ARTHROSCOPY WITH ROTATOR CUFF REPAIR AND OPEN BICEPS TENODESIS Right 12/24/2021   Procedure: RIGHT SHOULDER ARTHROSCOPY WITH ROTATOR CUFF REPAIR AND BICEPS TENODESIS;  Surgeon: Barbarann Oneil BROCKS, MD;  Location: [facility];  Service: Orthopedics;  Laterality: Right;   TONSILLECTOMY     as a child   UPPER GASTROINTESTINAL ENDOSCOPY      Social History:   reports that she has been smoking cigarettes. She has a 25 pack-year smoking history. She has never used smokeless tobacco. She reports current alcohol use. She reports current drug use. Drug: Marijuana.  Allergies[1]  Family History  Problem Relation Age of Onset   Heart disease Mother    Hypertension Mother    Stroke Mother    Mental illness Mother    Heart disease Father    Hypertension Father    Diabetes Father    Colon polyps Brother    Heart disease Maternal Grandfather    Cancer Paternal Grandmother        mouth   Anesthesia problems Daughter    Breast cancer Maternal Aunt    Throat cancer Maternal Uncle    Thyroid  disease Paternal Aunt    Hypotension Neg Hx    Malignant hyperthermia Neg Hx    Pseudochol deficiency Neg Hx    Colon cancer Neg Hx    Stomach cancer Neg Hx    Esophageal cancer Neg Hx    Pancreatic cancer Neg Hx    Rectal cancer Neg Hx      Prior to Admission medications  Medication Sig Start Date End Date Taking? Authorizing Provider  acetaminophen  (TYLENOL ) 500 MG tablet Take 1 tablet (500 mg total) by mouth every 6 (six) hours as needed for mild pain (pain score 1-3), fever or headache. 06/13/24   Arlice Reichert, MD  albuterol  (VENTOLIN  HFA) 108 (90  Base) MCG/ACT inhaler INHALE 2 PUFFS into THE lungs EVERY 6 HOURS AS NEEDED wheezing AND SHORTNESS OF BREATH 10/15/23   Vicci Barnie NOVAK, MD  alendronate  (FOSAMAX ) 70 MG tablet Take 1 tablet (70 mg total) by mouth every 7 (seven) days. Take with a full glass of water on an empty stomach. 04/06/24   Vicci Barnie NOVAK, MD  aspirin  EC 81 MG tablet Take 81 mg by mouth in the morning.    [provider]  atorvastatin  (LIPITOR ) 80 MG tablet Take 1 tablet (80 mg total) by mouth daily. 09/22/23   Daneen Damien BROCKS,  NP  busPIRone  (BUSPAR ) 15 MG tablet Take 2 tablets (30 mg total) by mouth in the morning AND 1 tablet (15 mg total) daily at 12 noon AND 1 tablet (15 mg total) every evening. Patient taking differently: Take 60mg  by mouth at bedtime 05/18/24   Nwoko, Uchenna E, PA  clonazePAM  (KLONOPIN ) 0.5 MG tablet Take 1 tablet (0.5 mg total) by mouth 2 (two) times daily as needed. 08/05/24 08/05/25  Nwoko, Uchenna E, PA  doxepin  (SINEQUAN ) 75 MG capsule Take 1 capsule (75 mg total) by mouth at bedtime. 05/18/24   Nwoko, Uchenna E, PA  Dupilumab  (DUPIXENT ) 300 MG/2ML SOAJ Inject 300 mg into the skin every 14 (fourteen) days. 05/26/24   Kara Dorn NOVAK, MD  empagliflozin  (JARDIANCE ) 10 MG TABS tablet Take 1 tablet (10 mg total) by mouth daily before breakfast. 07/13/24   Floretta Mallard, MD  escitalopram  (LEXAPRO ) 20 MG tablet Take 1 tablet (20 mg total) by mouth daily. 05/18/24   Nwoko, Uchenna E, PA  ezetimibe  (ZETIA ) 10 MG tablet Take 1 tablet (10 mg total) by mouth daily. 06/21/24   Meng, Hao, PA  Fluticasone -Umeclidin-Vilant (TRELEGY ELLIPTA ) 200-62.5-25 MCG/ACT AEPB Inhale 1 puff into the lungs daily. 07/02/24   Kara Dorn NOVAK, MD  gabapentin  (NEURONTIN ) 600 MG tablet Take 1 tablet (600 mg total) by mouth 2 (two) times daily. 12/04/23   Vicci Barnie NOVAK, MD  guaiFENesin -dextromethorphan  (ROBITUSSIN DM) 100-10 MG/5ML syrup Take 15 mLs by mouth every 4 (four) hours as needed for cough. 07/26/24   Von Bellis, MD  hydrOXYzine  (ATARAX ) 25 MG tablet Take 1 tablet (25 mg total) by mouth at bedtime as needed. 05/18/24   Nwoko, Uchenna E, PA  ipratropium-albuterol  (DUONEB) 0.5-2.5 (3) MG/3ML SOLN Take 3 mLs by nebulization every 8 (eight) hours. 01/15/24   Kara Dorn NOVAK, MD  levothyroxine  (SYNTHROID ) 112 MCG tablet Take 1 tablet (112 mcg total) by mouth daily. 01/22/24   Vicci Barnie NOVAK, MD  metFORMIN  (GLUCOPHAGE ) 500 MG tablet Take 1 tablet (500 mg total) by mouth 2 (two) times daily with a meal. Patient taking differently: Take 500 mg by mouth daily with breakfast. 03/12/24   Vicci Barnie NOVAK, MD  mirtazapine  (REMERON ) 45 MG tablet Take 1 tablet (45 mg total) by mouth at bedtime. 05/18/24   Nwoko, Uchenna E, PA  montelukast  (SINGULAIR ) 10 MG tablet Take 1 tablet (10 mg total) by mouth at bedtime. 11/11/23   Vicci Barnie NOVAK, MD  nebivolol  (BYSTOLIC ) 10 MG tablet Take 1 tablet (10 mg total) by mouth daily. 09/22/23   Daneen Damien BROCKS, NP  nitroGLYCERIN  (NITROSTAT ) 0.4 MG SL tablet Place 1 tablet (0.4 mg total) under the tongue every 5 (five) minutes as needed for chest pain. Patient not taking: Reported on 07/29/2024 04/15/24 07/19/25  Daneen Damien BROCKS, NP  OHTUVAYRE  3 MG/2.5ML SUSP Take 1 vial by nebulization in the morning and at bedtime. 05/19/24   [provider]  omeprazole  (PRILOSEC) 40 MG capsule Take 1 capsule (40 mg total) by mouth 2 (two) times daily before a meal. NEED APPOINTMENT FOR ADDITIONAL REFILLS 04/21/24   Legrand Victory LITTIE DOUGLAS, MD  OXYGEN  Inhale 4 L/min into the lungs continuous.    [provider]  ticagrelor  (BRILINTA ) 60 MG TABS tablet Take 1 tablet (60 mg total) by mouth 2 (two) times daily. 12/11/23   Burnard Debby LABOR, MD  torsemide  (DEMADEX ) 20 MG tablet Take 1-2 tablets (20-40 mg total) by mouth daily. Take 1 or 2 tablets, depending on lower  extremity edema and shortness of breath 07/26/24   Von Bellis, MD  varenicline  (CHANTIX ) 1 MG tablet Take 1 tablet (1 mg total)  by mouth 2 (two) times daily. 04/21/24   Kara Dorn NOVAK, MD  Vitamin D , Ergocalciferol , (DRISDOL ) 1.25 MG (50000 UNIT) CAPS capsule Take 1 capsule (50,000 Units total) by mouth every 7 (seven) days. 08/01/24 10/30/24  Von Bellis, MD    Physical Exam: Vitals:   08/06/24 1745 08/06/24 1830 08/06/24 1915 08/06/24 1931  BP: 115/83 130/78  136/75  Pulse: 96 88 85 88  Resp:   20 17  Temp:    98.9 F (37.2 C)  TempSrc:    Oral  SpO2: 96% 100% 98% 99%     Constitutional: NAD, no pallor or diaphoresis   Eyes: PERTLA, lids and conjunctivae normal ENMT: Mucous membranes are moist. Posterior pharynx clear of any exudate or lesions.   Neck: supple, no masses  Respiratory: Tachypneic. Coarse rales bilaterally, wheezing.  Cardiovascular: S1 & S2 heard, regular rate and rhythm. No extremity edema.   Abdomen: No tenderness, soft. Bowel sounds active.  Musculoskeletal: no clubbing / cyanosis. No joint deformity upper and lower extremities.   Skin: no significant rashes, lesions, ulcers. Warm, dry, well-perfused. Neurologic: CN 2-12 grossly intact. Moving all extremities. Alert and oriented.  Psychiatric: Calm. Cooperative.    Labs and Imaging on Admission: I have personally reviewed following labs and imaging studies  CBC: Recent Labs  Lab 08/06/24 1720 08/06/24 2054  WBC 16.6*  --   NEUTROABS 12.4*  --   HGB 11.7* 12.6  HCT 36.4 37.0  MCV 90.3  --   PLT 474*  --    Basic Metabolic Panel: Recent Labs  Lab 08/06/24 1720 08/06/24 2054  NA 137 134*  K 2.9* 2.9*  CL 89*  --   CO2 35*  --   GLUCOSE 113*  --   BUN 17  --   CREATININE 1.08*  --   CALCIUM  9.2  --   MG 1.7  --    GFR: Estimated Creatinine Clearance: 45.8 mL/min (A) (by C-G formula based on SCr of 1.08 mg/dL (H)). Liver Function Tests: Recent Labs  Lab 08/06/24 1720  AST 18  ALT 36  ALKPHOS 86  BILITOT 0.4  PROT 6.9  ALBUMIN 4.0   No results for input(s): LIPASE, AMYLASE in the last 168 hours. No  results for input(s): AMMONIA in the last 168 hours. Coagulation Profile: No results for input(s): INR, PROTIME in the last 168 hours. Cardiac Enzymes: No results for input(s): CKTOTAL, CKMB, CKMBINDEX, TROPONINI in the last 168 hours. BNP (last 3 results) Recent Labs    07/18/24 2240 07/21/24 0900 08/06/24 1720  PROBNP 214.0 349.0* 236.0   HbA1C: No results for input(s): HGBA1C in the last 72 hours. CBG: No results for input(s): GLUCAP in the last 168 hours. Lipid Profile: No results for input(s): CHOL, HDL, LDLCALC, TRIG, CHOLHDL, LDLDIRECT in the last 72 hours. Thyroid  Function Tests: No results for input(s): TSH, T4TOTAL, FREET4, T3FREE, THYROIDAB in the last 72 hours. Anemia Panel: No results for input(s): VITAMINB12, FOLATE, FERRITIN, TIBC, IRON , RETICCTPCT in the last 72 hours. Urine analysis:    Component Value Date/Time   COLORURINE STRAW (A) 11/26/2023 1250   APPEARANCEUR CLEAR 11/26/2023 1250   LABSPEC 1.005 11/26/2023 1250   PHURINE 6.0 11/26/2023 1250   GLUCOSEU NEGATIVE 11/26/2023 1250   HGBUR NEGATIVE 11/26/2023 1250   BILIRUBINUR NEGATIVE 11/26/2023 1250   BILIRUBINUR negative 11/24/2020 1018  KETONESUR NEGATIVE 11/26/2023 1250   PROTEINUR NEGATIVE 11/26/2023 1250   UROBILINOGEN 0.2 11/24/2020 1018   UROBILINOGEN 0.2 08/09/2014 1028   NITRITE NEGATIVE 11/26/2023 1250   LEUKOCYTESUR NEGATIVE 11/26/2023 1250   Sepsis Labs: @LABRCNTIP (procalcitonin:4,lacticidven:4) ) Recent Results (from the past 240 hours)  Resp panel by RT-PCR (RSV, Flu A&B, Covid) Anterior Nasal Swab     Status: None   Collection Time: 08/06/24  5:21 PM   Specimen: Anterior Nasal Swab  Result Value Ref Range Status   SARS Coronavirus 2 by RT PCR NEGATIVE NEGATIVE Final   Influenza A by PCR NEGATIVE NEGATIVE Final   Influenza B by PCR NEGATIVE NEGATIVE Final    Comment: (NOTE) The Xpert Xpress SARS-CoV-2/FLU/RSV plus assay is  intended as an aid in the diagnosis of influenza from Nasopharyngeal swab specimens and should not be used as a sole basis for treatment. Nasal washings and aspirates are unacceptable for Xpert Xpress SARS-CoV-2/FLU/RSV testing.  Fact Sheet for Patients: bloggercourse.com  Fact Sheet for Healthcare Providers: seriousbroker.it  This test is not yet approved or cleared by the [place]  FDA and has been authorized for detection and/or diagnosis of SARS-CoV-2 by FDA under an Emergency Use Authorization (EUA). This EUA will remain in effect (meaning this test can be used) for the duration of the COVID-19 declaration under Section 564(b)(1) of the Act, 21 U.S.C. section 360bbb-3(b)(1), unless the authorization is terminated or revoked.     Resp Syncytial Virus by PCR NEGATIVE NEGATIVE Final    Comment: (NOTE) Fact Sheet for Patients: bloggercourse.com  Fact Sheet for Healthcare Providers: seriousbroker.it  This test is not yet approved or cleared by the [place]  FDA and has been authorized for detection and/or diagnosis of SARS-CoV-2 by FDA under an Emergency Use Authorization (EUA). This EUA will remain in effect (meaning this test can be used) for the duration of the COVID-19 declaration under Section 564(b)(1) of the Act, 21 U.S.C. section 360bbb-3(b)(1), unless the authorization is terminated or revoked.  Performed at [facility], [address] [place], [place] [address]      Radiological Exams on Admission: DG Chest Portable 1 View Result Date: 08/06/2024 EXAM: 1 VIEW(S) XRAY OF THE CHEST 08/06/2024 05:56:00 PM COMPARISON: 07/18/2024 CLINICAL HISTORY: Shortness of breath. FINDINGS: LUNGS AND PLEURA: Low lung volumes. Streaky bibasilar opacities. No pleural effusion. No pneumothorax. HEART AND MEDIASTINUM: Aortic atherosclerosis. The cardiac silhouette is  unremarkable. BONES AND SOFT TISSUES: Chronic left clavicle fracture. IMPRESSION: 1. Mild bibasilar atelectasis, new from the prior exam. Electronically signed by: Oneil Devonshire MD 08/06/2024 06:16 PM EST RP Workstation: [id number]    Assessment/Plan   1. Pneumonia; COPD exacerbation; chronic hypoxic & hypercarbic respiratory failure - Culture sputum, check strep pneumo and legionella antigens, start antibiotics, continue systemic steroids, continue short-acting bronchodilators, continue supplemental O2     2. Chronic HFpEF  - Appears compensated  - Continue torsemide     3. Type II DM  - A1c was 6.5% in November 2025  - Check CBGs and use low-intensity SSI for now    4. Depression, anxiety  - Continue doxepin , Lexapro , Remeron , Buspar , and as-needed Klonopin     5. Hypokalemia - Replacing, will repeat chem panel in am    6. Hypothyroidism  - Synthroid     DVT prophylaxis: Lovenox   Code Status: Full  Level of Care: Level of care: Telemetry Family Communication: None present   Disposition Plan:  Patient is from: Home  Anticipated d/c is to: Home  Anticipated d/c date is:  Possibly as early as 1/24 Patient currently: Pending clinical stability  Consults called: None  Admission status: Observation     Evalene GORMAN Sprinkles, MD [facility]  08/06/2024, 10:08 PM       [1]  Allergies Allergen Reactions   Avocado Anaphylaxis   Latex Shortness Of Breath and Rash   Codeine  Nausea Only   "

## 2024-08-07 ENCOUNTER — Other Ambulatory Visit (HOSPITAL_COMMUNITY): Payer: Self-pay

## 2024-08-07 DIAGNOSIS — N189 Chronic kidney disease, unspecified: Secondary | ICD-10-CM | POA: Diagnosis present

## 2024-08-07 DIAGNOSIS — J189 Pneumonia, unspecified organism: Secondary | ICD-10-CM | POA: Diagnosis present

## 2024-08-07 DIAGNOSIS — Z794 Long term (current) use of insulin: Secondary | ICD-10-CM | POA: Diagnosis not present

## 2024-08-07 DIAGNOSIS — E872 Acidosis, unspecified: Secondary | ICD-10-CM | POA: Diagnosis present

## 2024-08-07 DIAGNOSIS — I5030 Unspecified diastolic (congestive) heart failure: Secondary | ICD-10-CM | POA: Diagnosis not present

## 2024-08-07 DIAGNOSIS — E785 Hyperlipidemia, unspecified: Secondary | ICD-10-CM | POA: Diagnosis present

## 2024-08-07 DIAGNOSIS — E039 Hypothyroidism, unspecified: Secondary | ICD-10-CM | POA: Diagnosis present

## 2024-08-07 DIAGNOSIS — I5032 Chronic diastolic (congestive) heart failure: Secondary | ICD-10-CM | POA: Diagnosis present

## 2024-08-07 DIAGNOSIS — Z7902 Long term (current) use of antithrombotics/antiplatelets: Secondary | ICD-10-CM | POA: Diagnosis not present

## 2024-08-07 DIAGNOSIS — J440 Chronic obstructive pulmonary disease with acute lower respiratory infection: Secondary | ICD-10-CM | POA: Diagnosis present

## 2024-08-07 DIAGNOSIS — J9622 Acute and chronic respiratory failure with hypercapnia: Secondary | ICD-10-CM | POA: Diagnosis present

## 2024-08-07 DIAGNOSIS — F17210 Nicotine dependence, cigarettes, uncomplicated: Secondary | ICD-10-CM | POA: Diagnosis present

## 2024-08-07 DIAGNOSIS — E876 Hypokalemia: Secondary | ICD-10-CM | POA: Diagnosis present

## 2024-08-07 DIAGNOSIS — J9811 Atelectasis: Secondary | ICD-10-CM | POA: Diagnosis present

## 2024-08-07 DIAGNOSIS — F32A Depression, unspecified: Secondary | ICD-10-CM | POA: Diagnosis present

## 2024-08-07 DIAGNOSIS — E1122 Type 2 diabetes mellitus with diabetic chronic kidney disease: Secondary | ICD-10-CM | POA: Diagnosis present

## 2024-08-07 DIAGNOSIS — A419 Sepsis, unspecified organism: Secondary | ICD-10-CM | POA: Diagnosis present

## 2024-08-07 DIAGNOSIS — I2510 Atherosclerotic heart disease of native coronary artery without angina pectoris: Secondary | ICD-10-CM | POA: Diagnosis present

## 2024-08-07 DIAGNOSIS — J9621 Acute and chronic respiratory failure with hypoxia: Secondary | ICD-10-CM | POA: Diagnosis present

## 2024-08-07 DIAGNOSIS — K219 Gastro-esophageal reflux disease without esophagitis: Secondary | ICD-10-CM | POA: Diagnosis present

## 2024-08-07 DIAGNOSIS — Z1152 Encounter for screening for COVID-19: Secondary | ICD-10-CM | POA: Diagnosis not present

## 2024-08-07 DIAGNOSIS — J441 Chronic obstructive pulmonary disease with (acute) exacerbation: Secondary | ICD-10-CM | POA: Diagnosis present

## 2024-08-07 DIAGNOSIS — I130 Hypertensive heart and chronic kidney disease with heart failure and stage 1 through stage 4 chronic kidney disease, or unspecified chronic kidney disease: Secondary | ICD-10-CM | POA: Diagnosis present

## 2024-08-07 DIAGNOSIS — E1165 Type 2 diabetes mellitus with hyperglycemia: Secondary | ICD-10-CM | POA: Diagnosis present

## 2024-08-07 DIAGNOSIS — E66811 Obesity, class 1: Secondary | ICD-10-CM | POA: Diagnosis present

## 2024-08-07 LAB — BASIC METABOLIC PANEL WITH GFR
Anion gap: 10 (ref 5–15)
BUN: 20 mg/dL (ref 8–23)
CO2: 34 mmol/L — ABNORMAL HIGH (ref 22–32)
Calcium: 9.4 mg/dL (ref 8.9–10.3)
Chloride: 94 mmol/L — ABNORMAL LOW (ref 98–111)
Creatinine, Ser: 0.87 mg/dL (ref 0.44–1.00)
GFR, Estimated: >60 mL/min
Glucose, Bld: 173 mg/dL — ABNORMAL HIGH (ref 70–99)
Potassium: 3.6 mmol/L (ref 3.5–5.1)
Sodium: 138 mmol/L (ref 135–145)

## 2024-08-07 LAB — GLUCOSE, CAPILLARY
Glucose-Capillary: 189 mg/dL — ABNORMAL HIGH (ref 70–99)
Glucose-Capillary: 218 mg/dL — ABNORMAL HIGH (ref 70–99)
Glucose-Capillary: 179 mg/dL — ABNORMAL HIGH (ref 70–99)
Glucose-Capillary: 205 mg/dL — ABNORMAL HIGH (ref 70–99)

## 2024-08-07 LAB — CBC
HCT: 33 % — ABNORMAL LOW (ref 36.0–46.0)
Hemoglobin: 10.6 g/dL — ABNORMAL LOW (ref 12.0–15.0)
MCH: 28.8 pg (ref 26.0–34.0)
MCHC: 32.1 g/dL (ref 30.0–36.0)
MCV: 89.7 fL (ref 80.0–100.0)
Platelets: 440 10*3/uL — ABNORMAL HIGH (ref 150–400)
RBC: 3.68 MIL/uL — ABNORMAL LOW (ref 3.87–5.11)
RDW: 15.8 % — ABNORMAL HIGH (ref 11.5–15.5)
WBC: 13.6 10*3/uL — ABNORMAL HIGH (ref 4.0–10.5)
nRBC: 0 % (ref 0.0–0.2)

## 2024-08-07 LAB — STREP PNEUMONIAE URINARY ANTIGEN: Strep Pneumo Urinary Antigen: NEGATIVE

## 2024-08-07 LAB — MAGNESIUM: Magnesium: 2.3 mg/dL (ref 1.7–2.4)

## 2024-08-07 MED ORDER — METHYLPREDNISOLONE SODIUM SUCC 125 MG IJ SOLR
125.0000 mg | INTRAMUSCULAR | Status: DC
  Administered 2024-08-08: 125 mg via INTRAVENOUS
  Filled 2024-08-07: qty 2

## 2024-08-07 MED ORDER — BUDESON-GLYCOPYRROL-FORMOTEROL 160-9-4.8 MCG/ACT IN AERO
2.0000 | INHALATION_SPRAY | Freq: Two times a day (BID) | RESPIRATORY_TRACT | Status: DC
  Administered 2024-08-07 – 2024-08-12 (×10): 2 via RESPIRATORY_TRACT
  Filled 2024-08-07: qty 5.9

## 2024-08-07 MED ORDER — INSULIN GLARGINE-YFGN 100 UNIT/ML ~~LOC~~ SOPN
5.0000 [IU] | PEN_INJECTOR | Freq: Every day | SUBCUTANEOUS | Status: DC
  Filled 2024-08-07: qty 3

## 2024-08-07 MED ORDER — INSULIN GLARGINE 100 UNIT/ML ~~LOC~~ SOLN
5.0000 [IU] | Freq: Every day | SUBCUTANEOUS | Status: DC
  Administered 2024-08-08 – 2024-08-12 (×5): 5 [IU] via SUBCUTANEOUS
  Filled 2024-08-07 (×7): qty 0.05

## 2024-08-07 MED ORDER — POTASSIUM CHLORIDE CRYS ER 20 MEQ PO TBCR
40.0000 meq | EXTENDED_RELEASE_TABLET | Freq: Once | ORAL | Status: AC
  Administered 2024-08-07: 40 meq via ORAL
  Filled 2024-08-07: qty 2

## 2024-08-07 NOTE — Plan of Care (Signed)
" °  Problem: Education: Goal: Ability to describe self-care measures that may prevent or decrease complications (Diabetes Survival Skills Education) will improve Outcome: Progressing   Problem: Coping: Goal: Ability to adjust to condition or change in health will improve Outcome: Progressing   Problem: Coping: Goal: Ability to adjust to condition or change in health will improve Outcome: Progressing   Problem: Health Behavior/Discharge Planning: Goal: Ability to manage health-related needs will improve Outcome: Progressing   Problem: Metabolic: Goal: Ability to maintain appropriate glucose levels will improve Outcome: Progressing   Problem: Education: Goal: Knowledge of General Education information will improve Description: Including pain rating scale, medication(s)/side effects and non-pharmacologic comfort measures Outcome: Progressing   "

## 2024-08-07 NOTE — Evaluation (Signed)
 Physical Therapy Evaluation Patient Details Name: [name] [name] MRN: [id number] DOB: 12-29-1955 Today's Date: 08/07/2024  History of Present Illness  Pt is a 69 y.o. F presenting to [facility] on 08/06/24 with SOB and cough. Pt admitted with PNA and COPD exacerbation. PMH is significant for HTN, CAD , DM2, COPD on 4L O2, hypothyroidism, RTC repair 12/24/2021, ongoing tobacco abuse. Recent admission 12/4 - 12/9 d/t acute on chronic hypoxic respiratory failure   Clinical Impression  Prior to admittance, pt was independent with mobility and mod I with ADLs. Pt presents to evaluation with deficits in mobility, power, activity tolerance, and balance. Pt was able to ambulate short household level distances without AD and no physical assistance. Pt was given LE HEP, educated on its contents and on the importance of frequent mobilization to reduce risk of other medical complications. Pt may benefit from utilizing rollator for energy conservation; plan to bring next session. PT will continue to treat pt while she is admitted. Recommending HHPT at discharge to address remaining mobility deficits and optimize return to PLOF.          If plan is discharge home, recommend the following: A little help with walking and/or transfers;A little help with bathing/dressing/bathroom;Assistance with cooking/housework;Assist for transportation;Help with stairs or ramp for entrance   Can travel by private vehicle        Equipment Recommendations None recommended by PT  Recommendations for Other Services       Functional Status Assessment Patient has had a recent decline in their functional status and demonstrates the ability to make significant improvements in function in a reasonable and predictable amount of time.     Precautions / Restrictions Precautions Precautions: Fall Recall of Precautions/Restrictions: Intact Restrictions Weight Bearing Restrictions Per Provider Order: No      Mobility  Bed  Mobility Overal bed mobility: Modified Independent             General bed mobility comments: HOB elevated    Transfers Overall transfer level: Needs assistance Equipment used: None Transfers: Sit to/from Stand Sit to Stand: Supervision           General transfer comment: STS x2 from EOB with pt pushing up from surface with BUE and no physical assistance.    Ambulation/Gait Ambulation/Gait assistance: Supervision Gait Distance (Feet): 100 Feet Assistive device: None Gait Pattern/deviations: Step-through pattern, Shuffle, Trunk flexed Gait velocity: reduced Gait velocity interpretation: <1.8 ft/sec, indicate of risk for recurrent falls   General Gait Details: Pt demonstrates reciprocal gait pattern with slight anterior trunk lean and reduced clearance of BLEs, demonstrating shuffle like gait.  Stairs            Wheelchair Mobility     Tilt Bed    Modified Rankin (Stroke Patients Only)       Balance Overall balance assessment: Needs assistance Sitting-balance support: No upper extremity supported, Feet supported Sitting balance-Leahy Scale: Good Sitting balance - Comments: seated EOB for MMT with no LOB or trunk lean   Standing balance support: No upper extremity supported, During functional activity Standing balance-Leahy Scale: Fair Standing balance comment: able to shift weight throughout gait with no LOB or signs of instability                             Pertinent Vitals/Pain Pain Assessment Pain Assessment: No/denies pain    Home Living Family/patient expects to be discharged to:: Private residence Living Arrangements: Alone Available Help at  Discharge: Family;Available PRN/intermittently Type of Home: Other(Comment) (townhome) Home Access: Stairs to enter Entrance Stairs-Rails: Left Entrance Stairs-Number of Steps: 4   Home Layout: One level Home Equipment: Agricultural [employer]ant (2 wheels);Rollator (4 wheels);Crutches;Shower seat -  built in;Grab bars - toilet;Grab bars - tub/shower      Prior Function Prior Level of Function : Independent/Modified Independent             Mobility Comments: indep ADLs Comments: indep     Extremity/Trunk Assessment   Upper Extremity Assessment Upper Extremity Assessment: Generalized weakness;Defer to OT evaluation    Lower Extremity Assessment Lower Extremity Assessment: Generalized weakness    Cervical / Trunk Assessment Cervical / Trunk Assessment: Kyphotic  Communication   Communication Communication: No apparent difficulties    Cognition Arousal: Alert Behavior During Therapy: WFL for tasks assessed/performed   PT - Cognitive impairments: Memory                       PT - Cognition Comments: Per pt and pt's daughter, pt has been having difficulty with her short term memory. Following commands: Intact       Cueing Cueing Techniques: Verbal cues, Gestural cues     General Comments General comments (skin integrity, edema, etc.): on 4L with SpO2 94% at rest, requiring up to 6L while ambulating with Spo2 88%. pt had several instances of tachycardia at rest throughout session    Exercises Other Exercises Other Exercises: Educated on and provided physical HEP to pt (sit to stands, LAQ, seated marches)   Assessment/Plan    PT Assessment Patient needs continued PT services  PT Problem List Decreased activity tolerance;Decreased balance;Decreased mobility;Decreased coordination;Decreased knowledge of use of DME;Cardiopulmonary status limiting activity       PT Treatment Interventions DME instruction;Gait training;Stair training;Functional mobility training;Therapeutic activities;Therapeutic exercise;Balance training;Cognitive remediation;Patient/family education    PT Goals (Current goals can be found in the Care Plan section)  Acute Rehab PT Goals Patient Stated Goal: to be able to stay out of the hospital and get stronger PT Goal Formulation:  With patient/family Time For Goal Achievement: 08/21/24 Potential to Achieve Goals: Fair    Frequency Min 1X/week     Co-evaluation               AM-PAC PT 6 Clicks Mobility  Outcome Measure Help needed turning from your back to your side while in a flat bed without using bedrails?: None Help needed moving from lying on your back to sitting on the side of a flat bed without using bedrails?: None Help needed moving to and from a bed to a chair (including a wheelchair)?: A Little Help needed standing up from a chair using your arms (e.g., wheelchair or bedside chair)?: A Little Help needed to walk in hospital room?: A Little Help needed climbing 3-5 steps with a railing? : Total 6 Click Score: 18    End of Session Equipment Utilized During Treatment: Gait belt;Oxygen  Activity Tolerance: Patient tolerated treatment well Patient left: in bed;with call bell/phone within reach Nurse Communication: Mobility status;Other (comment) (vitals) PT Visit Diagnosis: Unsteadiness on feet (R26.81);Other abnormalities of gait and mobility (R26.89);Muscle weakness (generalized) (M62.81)    Time: 8583-8541 PT Time Calculation (min) (ACUTE ONLY): 42 min   Charges:   PT Evaluation $PT Eval Moderate Complexity: 1 Mod PT Treatments $Therapeutic Exercise: 8-22 mins PT General Charges $$ ACUTE PT VISIT: 1 Visit         Leontine Hilt DPT Acute Rehab Services [phone]  Prefer contact via chat   Leontine NOVAK Brydan Downard 08/07/2024, 3:43 PM

## 2024-08-07 NOTE — Care Management Obs Status (Signed)
 MEDICARE OBSERVATION STATUS NOTIFICATION   Patient Details  Name: [name] [name] MRN: [id number] Date of Birth: Feb 23, 1956   Medicare Observation Status Notification Given:  Yes    Marval Gell, RN 08/07/2024, 12:48 PM

## 2024-08-07 NOTE — Progress Notes (Signed)
 " PROGRESS NOTE    [name] [name]  FMW:[id number] DOB: July 13, 1956 DOA: 08/06/2024 PCP: Vicci Barnie [name], MD  Chief Complaint  Patient presents with   Hypotension   Shortness of Breath    Brief Narrative:    [name] [name] is a 69 y.o. female with medical history significant for asthma COPD overlap, chronic hypoxic and hypercarbic respiratory failure, chronic HFpEF, CAD, depression, anxiety, type 2 diabetes mellitus, and hypothyroidism who presents with cough and shortness of breath.  In ED her workup was significant for leukocytosis, elevated lactic acid, and imaging significant for pneumonia  Assessment & Plan:   Principal Problem:   COPD exacerbation (HCC) Active Problems:   Acute on chronic respiratory failure with hypoxia and hypercapnia (HCC)   (HFpEF) heart failure with preserved ejection fraction (HCC)   Acquired hypothyroidism   Essential hypertension   Non-insulin  dependent type 2 diabetes mellitus (HCC)   Anxiety and depression   Pneumonia   Hypokalemia   Sepsis, POA Pneumonia COPD exacerbation Chronic respiratory failure with hypoxia - Sepsis, POA, has leukocytosis, elevated lactic acid, low-grade temperature - CT chest negative for PE but significant for up pneumonia - IV Solu-Medrol , scheduled DuoNebs, as needed albuterol  - Continue with IV Rocephin  and doxycycline , follow on sputum cultures, strep and Legionella antigen. - She was encouraged to use incentive spirometry and flutter valve   Chronic HFpEF  - Appears compensated  - Continue torsemide      Type II DM  - A1c was 6.5% in November 2025  - CBG remains elevated likely due to acute infection and IV steroids   Depression, anxiety  - Continue doxepin , Lexapro , Remeron , Buspar , and as-needed Klonopin      Hypokalemia - Replacing,    Hypothyroidism  - Synthroid    CAD, HTN, HLD -  Continued Zetia , Lipitor , Bystolic , Demadex , aspirin , Brilinta    Patient quit smoking last month, she was  congratulated about that.  DVT prophylaxis: (Lovenox ) Code Status: (Full) Family Communication: (None at bedside Disposition:      Consultants:  None   Subjective:  Still reports dyspnea, cough  Objective: Vitals:   08/07/24 0422 08/07/24 0734 08/07/24 0752 08/07/24 1147  BP: (!) 120/105  113/63 113/62  Pulse:  68 67 73  Resp: 17  13   Temp:   (!) 97.4 F (36.3 C) 98.5 F (36.9 C)  TempSrc:   Axillary Oral  SpO2:      Weight:      Height:       No intake or output data in the 24 hours ending 08/07/24 1248 Filed Weights   08/06/24 2256  Weight: 75.9 kg    Examination:  Awake Alert, Oriented X 3, frail, no apparent distress Scattered wheezing bilaterally and diminished air entry RRR,No Gallops +ve B.Sounds, Abd Soft. No Cyanosis, Clubbing or edema, No new Rash or bruise      Data Reviewed: I have personally reviewed following labs and imaging studies  CBC: Recent Labs  Lab 08/06/24 1720 08/06/24 2054 08/07/24 0541  WBC 16.6*  --  13.6*  NEUTROABS 12.4*  --   --   HGB 11.7* 12.6 10.6*  HCT 36.4 37.0 33.0*  MCV 90.3  --  89.7  PLT 474*  --  440*    Basic Metabolic Panel: Recent Labs  Lab 08/06/24 1720 08/06/24 2054 08/07/24 0541  NA 137 134* 138  K 2.9* 2.9* 3.6  CL 89*  --  94*  CO2 35*  --  34*  GLUCOSE 113*  --  173*  BUN 17  --  20  CREATININE 1.08*  --  0.87  CALCIUM  9.2  --  9.4  MG 1.7  --  2.3    GFR: Estimated Creatinine Clearance: 57.6 mL/min (by C-G formula based on SCr of 0.87 mg/dL).  Liver Function Tests: Recent Labs  Lab 08/06/24 1720  AST 18  ALT 36  ALKPHOS 86  BILITOT 0.4  PROT 6.9  ALBUMIN 4.0    CBG: Recent Labs  Lab 08/06/24 2311 08/07/24 0750 08/07/24 1145  GLUCAP 358* 189* 218*     Recent Results (from the past 240 hours)  Resp panel by RT-PCR (RSV, Flu A&B, Covid) Anterior Nasal Swab     Status: None   Collection Time: 08/06/24  5:21 PM   Specimen: Anterior Nasal Swab  Result Value Ref  Range Status   SARS Coronavirus 2 by RT PCR NEGATIVE NEGATIVE Final   Influenza A by PCR NEGATIVE NEGATIVE Final   Influenza B by PCR NEGATIVE NEGATIVE Final    Comment: (NOTE) The Xpert Xpress SARS-CoV-2/FLU/RSV plus assay is intended as an aid in the diagnosis of influenza from Nasopharyngeal swab specimens and should not be used as a sole basis for treatment. Nasal washings and aspirates are unacceptable for Xpert Xpress SARS-CoV-2/FLU/RSV testing.  Fact Sheet for Patients: bloggercourse.com  Fact Sheet for Healthcare Providers: seriousbroker.it  This test is not yet approved or cleared by the [place]  FDA and has been authorized for detection and/or diagnosis of SARS-CoV-2 by FDA under an Emergency Use Authorization (EUA). This EUA will remain in effect (meaning this test can be used) for the duration of the COVID-19 declaration under Section 564(b)(1) of the Act, 21 U.S.C. section 360bbb-3(b)(1), unless the authorization is terminated or revoked.     Resp Syncytial Virus by PCR NEGATIVE NEGATIVE Final    Comment: (NOTE) Fact Sheet for Patients: bloggercourse.com  Fact Sheet for Healthcare Providers: seriousbroker.it  This test is not yet approved or cleared by the [place]  FDA and has been authorized for detection and/or diagnosis of SARS-CoV-2 by FDA under an Emergency Use Authorization (EUA). This EUA will remain in effect (meaning this test can be used) for the duration of the COVID-19 declaration under Section 564(b)(1) of the Act, 21 U.S.C. section 360bbb-3(b)(1), unless the authorization is terminated or revoked.  Performed at [facility], [address], [place], [place] [address]   Blood culture (routine x 2)     Status: None (Preliminary result)   Collection Time: 08/06/24  6:13 PM   Specimen: BLOOD  Result Value Ref Range Status   Specimen  Description BLOOD RIGHT ANTECUBITAL  Final   Special Requests   Final    BOTTLES DRAWN AEROBIC AND ANAEROBIC Blood Culture results may not be optimal due to an inadequate volume of blood received in culture bottles   Culture   Final    NO GROWTH < 24 HOURS Performed at [facility], [address] [place], [place] [address]    Report Status PENDING  Incomplete  Blood culture (routine x 2)     Status: None (Preliminary result)   Collection Time: 08/06/24  8:49 PM   Specimen: BLOOD LEFT ARM  Result Value Ref Range Status   Specimen Description BLOOD LEFT ARM  Final   Special Requests   Final    BOTTLES DRAWN AEROBIC AND ANAEROBIC Blood Culture adequate volume   Culture   Final    NO GROWTH < 12 HOURS Performed  at [facility], [address] [place], [place] [address]    Report Status PENDING  Incomplete         Radiology Studies: CT Angio Chest PE W and/or Wo Contrast Result Date: 08/06/2024 EXAM: CTA of the Chest with contrast for PE 08/06/2024 10:38:53 PM TECHNIQUE: CTA of the chest was performed without and with the administration of 75 mL of intravenous contrast (iohexol  (OMNIPAQUE ) 350 MG/ML injection). Multiplanar reformatted images are provided for review. MIP images are provided for review. Automated exposure control, iterative reconstruction, and/or weight based adjustment of the mA/kV was utilized to reduce the radiation dose to as low as reasonably achievable. COMPARISON: Chest radiograph 08/06/2024 and CT chest abdomen and pelvis 11/12/2023. CLINICAL HISTORY: Pulmonary embolism (PE) suspected, low to intermediate prob, neg D-dimer. Pulmonary embolism suspected, low to intermediate probability, negative D-dimer. FINDINGS: PULMONARY ARTERIES: Good opacification of the central and segmental pulmonary arteries. No focal filling defects. No evidence of significant pulmonary embolus. Main pulmonary artery is normal in caliber. MEDIASTINUM: Normal heart size. No  pericardial effusions. Calcification of the aorta and coronary arteries. No aortic aneurysm or dissection. Great vessel origins are patent. The thyroid  gland is atrophic. The esophagus is decompressed. Debris in the nondilated esophagus may represent evidence of dysmotility. LYMPH NODES: No significant lymphadenopathy. LUNGS AND PLEURA: Emphysematous changes in the lungs. Peribronchial infiltrates in the posterior lungs with mucous plugging or debris in the lower lobe bronchi. Mild bronchiectasis and bronchial wall thickening. Changes may represent multifocal pneumonia or aspiration. No pleural effusion or pneumothorax. UPPER ABDOMEN: No acute abnormalities demonstrated in the visualized upper abdomen. Left adrenal gland nodule measuring 1.3 cm diameter. Negative 0.77 Hounsfield unit suggests benign adenoma. No imaging follow-up is indicated. No change since the prior study. SOFT TISSUES AND BONES: Degenerative changes in the spine. Multiple old right rib fractures. No acute bony abnormalities. Mild motion artifact. IMPRESSION: 1. No evidence of pulmonary embolism. 2. Peribronchial infiltrates in the posterior lungs with mucous plugging or debris in the lower lobe bronchi, mild bronchiectasis, and bronchial wall thickening, possibly representing multifocal pneumonia or aspiration. 3. Pulmonary emphysema is an independent risk factor for lung cancer. Recommend consideration for evaluation for a low-dose CT lung cancer screening program. 4. 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 No imaging follow-up is indicated. Electronically signed by: Elsie Gravely MD 08/06/2024 10:48 PM EST RP Workstation: [id number]   DG Chest Portable 1 View Result Date: 08/06/2024 EXAM: 1 VIEW(S) XRAY OF THE CHEST 08/06/2024 05:56:00 PM COMPARISON: 07/18/2024 CLINICAL HISTORY: Shortness of breath. FINDINGS: LUNGS AND PLEURA: Low lung volumes. Streaky bibasilar opacities. No pleural effusion. No pneumothorax. HEART AND  MEDIASTINUM: Aortic atherosclerosis. The cardiac silhouette is unremarkable. BONES AND SOFT TISSUES: Chronic left clavicle fracture. IMPRESSION: 1. Mild bibasilar atelectasis, new from the prior exam. Electronically signed by: Oneil Devonshire MD 08/06/2024 06:16 PM EST RP Workstation: [id number]        Scheduled Meds:  aspirin  EC  81 mg Oral Daily   atorvastatin   80 mg Oral Daily   busPIRone   30 mg Oral q AM   And   busPIRone   15 mg Oral Q1200   And   busPIRone   15 mg Oral QPM   doxepin   75 mg Oral QHS   doxycycline   100 mg Oral Q12H   enoxaparin  (LOVENOX ) injection  40 mg Subcutaneous Q24H   escitalopram   20 mg Oral Daily   gabapentin   600 mg Oral BID   insulin  aspart  0-5 Units Subcutaneous  QHS   insulin  aspart  0-6 Units Subcutaneous TID WC   ipratropium-albuterol   3 mL Nebulization TID   levothyroxine   112 mcg Oral Daily   [START ON 08/08/2024] methylPREDNISolone  (SOLU-MEDROL ) injection  125 mg Intravenous Q24H   mirtazapine   45 mg Oral QHS   montelukast   10 mg Oral QHS   nebivolol   10 mg Oral Daily   pantoprazole   40 mg Oral BID AC   sodium chloride  flush  3 mL Intravenous Q12H   ticagrelor   60 mg Oral BID   torsemide   20 mg Oral Daily   Continuous Infusions:  cefTRIAXone  (ROCEPHIN )  IV Stopped (08/07/24 1040)     LOS: 0 days      Brayton Lye, MD [facility]   To contact the attending provider between 7A-7P or the covering provider during after hours 7P-7A, please log into the web site [email or web address] and access using universal [place] password for that web site. If you do not have the password, please call the hospital operator.  08/07/2024, 12:48 PM   "

## 2024-08-07 NOTE — Plan of Care (Signed)

## 2024-08-08 DIAGNOSIS — J441 Chronic obstructive pulmonary disease with (acute) exacerbation: Secondary | ICD-10-CM | POA: Diagnosis not present

## 2024-08-08 LAB — BASIC METABOLIC PANEL WITH GFR
Anion gap: 12 (ref 5–15)
BUN: 27 mg/dL — ABNORMAL HIGH (ref 8–23)
CO2: 31 mmol/L (ref 22–32)
Calcium: 9.8 mg/dL (ref 8.9–10.3)
Chloride: 96 mmol/L — ABNORMAL LOW (ref 98–111)
Creatinine, Ser: 1.04 mg/dL — ABNORMAL HIGH (ref 0.44–1.00)
GFR, Estimated: 58 mL/min — ABNORMAL LOW
Glucose, Bld: 143 mg/dL — ABNORMAL HIGH (ref 70–99)
Potassium: 3.8 mmol/L (ref 3.5–5.1)
Sodium: 140 mmol/L (ref 135–145)

## 2024-08-08 LAB — GLUCOSE, CAPILLARY
Glucose-Capillary: 146 mg/dL — ABNORMAL HIGH (ref 70–99)
Glucose-Capillary: 219 mg/dL — ABNORMAL HIGH (ref 70–99)
Glucose-Capillary: 222 mg/dL — ABNORMAL HIGH (ref 70–99)
Glucose-Capillary: 222 mg/dL — ABNORMAL HIGH (ref 70–99)

## 2024-08-08 MED ORDER — METHYLPREDNISOLONE SODIUM SUCC 125 MG IJ SOLR
80.0000 mg | INTRAMUSCULAR | Status: DC
  Administered 2024-08-09: 80 mg via INTRAVENOUS
  Filled 2024-08-08: qty 2

## 2024-08-08 MED ORDER — MELATONIN 3 MG PO TABS
3.0000 mg | ORAL_TABLET | Freq: Once | ORAL | Status: AC
  Administered 2024-08-08: 3 mg via ORAL
  Filled 2024-08-08: qty 1

## 2024-08-08 NOTE — Progress Notes (Signed)
 " PROGRESS NOTE    NECOLA [name]  FMW:[id number] DOB: 09/04/1955 DOA: 08/06/2024 PCP: Vicci Barnie [name], MD  Chief Complaint  Patient presents with   Hypotension   Shortness of Breath    Brief Narrative:    [name] [name] is a 69 y.o. female with medical history significant for asthma COPD overlap, chronic hypoxic and hypercarbic respiratory failure, chronic HFpEF, CAD, depression, anxiety, type 2 diabetes mellitus, and hypothyroidism who presents with cough and shortness of breath.  In ED her workup was significant for leukocytosis, elevated lactic acid, and imaging significant for pneumonia  Assessment & Plan:   Principal Problem:   COPD exacerbation (HCC) Active Problems:   Acute on chronic respiratory failure with hypoxia and hypercapnia (HCC)   (HFpEF) heart failure with preserved ejection fraction (HCC)   Acquired hypothyroidism   Essential hypertension   Non-insulin  dependent type 2 diabetes mellitus (HCC)   Anxiety and depression   Pneumonia   Hypokalemia   Sepsis, POA Pneumonia COPD exacerbation Chronic respiratory failure with hypoxia - Sepsis, POA, has leukocytosis, elevated lactic acid, low-grade temperature - CT chest negative for PE but significant for up pneumonia - Continue IV Solu-Medrol , scheduled DuoNebs, as needed albuterol , she has less wheezing today so we will decrease her IV Solu-Medrol  to 80 mg IV from tomorrow - Continue with IV Rocephin  and doxycycline , follow on sputum cultures, strep and Legionella antigen. - She was encouraged to use incentive spirometry and flutter valve   Chronic HFpEF  - Appears compensated  - Continue torsemide      Type II DM  - A1c was 6.5% in November 2025  - CBG remains elevated likely due to acute infection and IV steroids   Depression, anxiety  - Continue doxepin , Lexapro , Remeron , Buspar , and as-needed Klonopin      Hypokalemia - Replacing,    Hypothyroidism  - Synthroid    CAD, HTN, HLD -  Continued  Zetia , Lipitor , Bystolic , Demadex , aspirin , Brilinta    Patient quit smoking last month, she was congratulated about that.  DVT prophylaxis: (Lovenox ) Code Status: (Full) Family Communication: (None at bedside Disposition:      Consultants:  None   Subjective:  Ports dyspnea has improved, reports cough, she feels it difficult to produce her phlegm, she was encouraged to use incentive spirometry and flutter valve Objective: Vitals:   08/07/24 2053 08/07/24 2128 08/07/24 2340 08/08/24 0823  BP:   135/69 125/63  Pulse:   78 72  Resp:   17 18  Temp:   97.8 F (36.6 C) 97.8 F (36.6 C)  TempSrc:   Oral Oral  SpO2: 95% 99% 99% 95%  Weight:      Height:        Intake/Output Summary (Last 24 hours) at 08/08/2024 1301 Last data filed at 08/08/2024 1000 Gross per 24 hour  Intake 104 ml  Output 700 ml  Net -596 ml   Filed Weights   08/06/24 2256  Weight: 75.9 kg    Examination:  Awake Alert, Oriented X 3, frail, no apparent distress Good air entry today, minimal wheezing noted RRR,No Gallops +ve B.Sounds, Abd Soft. No Cyanosis, Clubbing or edema, significant skin bruising at baseline   Data Reviewed: I have personally reviewed following labs and imaging studies  CBC: Recent Labs  Lab 08/06/24 1720 08/06/24 2054 08/07/24 0541  WBC 16.6*  --  13.6*  NEUTROABS 12.4*  --   --   HGB 11.7* 12.6 10.6*  HCT 36.4 37.0 33.0*  MCV 90.3  --  89.7  PLT 474*  --  440*    Basic Metabolic Panel: Recent Labs  Lab 08/06/24 1720 08/06/24 2054 08/07/24 0541 08/08/24 0616  NA 137 134* 138 140  K 2.9* 2.9* 3.6 3.8  CL 89*  --  94* 96*  CO2 35*  --  34* 31  GLUCOSE 113*  --  173* 143*  BUN 17  --  20 27*  CREATININE 1.08*  --  0.87 1.04*  CALCIUM  9.2  --  9.4 9.8  MG 1.7  --  2.3  --     GFR: Estimated Creatinine Clearance: 48.2 mL/min (A) (by C-G formula based on SCr of 1.04 mg/dL (H)).  Liver Function Tests: Recent Labs  Lab 08/06/24 1720  AST 18  ALT 36   ALKPHOS 86  BILITOT 0.4  PROT 6.9  ALBUMIN 4.0    CBG: Recent Labs  Lab 08/07/24 1145 08/07/24 1550 08/07/24 2045 08/08/24 0751 08/08/24 1138  GLUCAP 218* 179* 205* 146* 219*     Recent Results (from the past 240 hours)  Resp panel by RT-PCR (RSV, Flu A&B, Covid) Anterior Nasal Swab     Status: None   Collection Time: 08/06/24  5:21 PM   Specimen: Anterior Nasal Swab  Result Value Ref Range Status   SARS Coronavirus 2 by RT PCR NEGATIVE NEGATIVE Final   Influenza A by PCR NEGATIVE NEGATIVE Final   Influenza B by PCR NEGATIVE NEGATIVE Final    Comment: (NOTE) The Xpert Xpress SARS-CoV-2/FLU/RSV plus assay is intended as an aid in the diagnosis of influenza from Nasopharyngeal swab specimens and should not be used as a sole basis for treatment. Nasal washings and aspirates are unacceptable for Xpert Xpress SARS-CoV-2/FLU/RSV testing.  Fact Sheet for Patients: bloggercourse.com  Fact Sheet for Healthcare Providers: seriousbroker.it  This test is not yet approved or cleared by the [place]  FDA and has been authorized for detection and/or diagnosis of SARS-CoV-2 by FDA under an Emergency Use Authorization (EUA). This EUA will remain in effect (meaning this test can be used) for the duration of the COVID-19 declaration under Section 564(b)(1) of the Act, 21 U.S.C. section 360bbb-3(b)(1), unless the authorization is terminated or revoked.     Resp Syncytial Virus by PCR NEGATIVE NEGATIVE Final    Comment: (NOTE) Fact Sheet for Patients: bloggercourse.com  Fact Sheet for Healthcare Providers: seriousbroker.it  This test is not yet approved or cleared by the [place]  FDA and has been authorized for detection and/or diagnosis of SARS-CoV-2 by FDA under an Emergency Use Authorization (EUA). This EUA will remain in effect (meaning this test can be used) for the  duration of the COVID-19 declaration under Section 564(b)(1) of the Act, 21 U.S.C. section 360bbb-3(b)(1), unless the authorization is terminated or revoked.  Performed at [facility], [address], [place], [place] [address]   Blood culture (routine x 2)     Status: None (Preliminary result)   Collection Time: 08/06/24  6:13 PM   Specimen: BLOOD  Result Value Ref Range Status   Specimen Description BLOOD RIGHT ANTECUBITAL  Final   Special Requests   Final    BOTTLES DRAWN AEROBIC AND ANAEROBIC Blood Culture results may not be optimal due to an inadequate volume of blood received in culture bottles   Culture   Final    NO GROWTH 2 DAYS Performed at [facility], [address], [place], [place] [address]    Report Status PENDING  Incomplete  Blood culture (  routine x 2)     Status: None (Preliminary result)   Collection Time: 08/06/24  8:49 PM   Specimen: BLOOD LEFT ARM  Result Value Ref Range Status   Specimen Description BLOOD LEFT ARM  Final   Special Requests   Final    BOTTLES DRAWN AEROBIC AND ANAEROBIC Blood Culture adequate volume   Culture   Final    NO GROWTH 2 DAYS Performed at [facility], [address], [place], [place] [address]    Report Status PENDING  Incomplete         Radiology Studies: CT Angio Chest PE W and/or Wo Contrast Result Date: 08/06/2024 EXAM: CTA of the Chest with contrast for PE 08/06/2024 10:38:53 PM TECHNIQUE: CTA of the chest was performed without and with the administration of 75 mL of intravenous contrast (iohexol  (OMNIPAQUE ) 350 MG/ML injection). Multiplanar reformatted images are provided for review. MIP images are provided for review. Automated exposure control, iterative reconstruction, and/or weight based adjustment of the mA/kV was utilized to reduce the radiation dose to as low as reasonably achievable. COMPARISON: Chest radiograph 08/06/2024 and CT chest abdomen and pelvis 11/12/2023. CLINICAL HISTORY:  Pulmonary embolism (PE) suspected, low to intermediate prob, neg D-dimer. Pulmonary embolism suspected, low to intermediate probability, negative D-dimer. FINDINGS: PULMONARY ARTERIES: Good opacification of the central and segmental pulmonary arteries. No focal filling defects. No evidence of significant pulmonary embolus. Main pulmonary artery is normal in caliber. MEDIASTINUM: Normal heart size. No pericardial effusions. Calcification of the aorta and coronary arteries. No aortic aneurysm or dissection. Great vessel origins are patent. The thyroid  gland is atrophic. The esophagus is decompressed. Debris in the nondilated esophagus may represent evidence of dysmotility. LYMPH NODES: No significant lymphadenopathy. LUNGS AND PLEURA: Emphysematous changes in the lungs. Peribronchial infiltrates in the posterior lungs with mucous plugging or debris in the lower lobe bronchi. Mild bronchiectasis and bronchial wall thickening. Changes may represent multifocal pneumonia or aspiration. No pleural effusion or pneumothorax. UPPER ABDOMEN: No acute abnormalities demonstrated in the visualized upper abdomen. Left adrenal gland nodule measuring 1.3 cm diameter. Negative 0.77 Hounsfield unit suggests benign adenoma. No imaging follow-up is indicated. No change since the prior study. SOFT TISSUES AND BONES: Degenerative changes in the spine. Multiple old right rib fractures. No acute bony abnormalities. Mild motion artifact. IMPRESSION: 1. No evidence of pulmonary embolism. 2. Peribronchial infiltrates in the posterior lungs with mucous plugging or debris in the lower lobe bronchi, mild bronchiectasis, and bronchial wall thickening, possibly representing multifocal pneumonia or aspiration. 3. Pulmonary emphysema is an independent risk factor for lung cancer. Recommend consideration for evaluation for a low-dose CT lung cancer screening program. 4. 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 No imaging follow-up is  indicated. Electronically signed by: Elsie Gravely MD 08/06/2024 10:48 PM EST RP Workstation: [id number]   DG Chest Portable 1 View Result Date: 08/06/2024 EXAM: 1 VIEW(S) XRAY OF THE CHEST 08/06/2024 05:56:00 PM COMPARISON: 07/18/2024 CLINICAL HISTORY: Shortness of breath. FINDINGS: LUNGS AND PLEURA: Low lung volumes. Streaky bibasilar opacities. No pleural effusion. No pneumothorax. HEART AND MEDIASTINUM: Aortic atherosclerosis. The cardiac silhouette is unremarkable. BONES AND SOFT TISSUES: Chronic left clavicle fracture. IMPRESSION: 1. Mild bibasilar atelectasis, new from the prior exam. Electronically signed by: Oneil Devonshire MD 08/06/2024 06:16 PM EST RP Workstation: [id number]        Scheduled Meds:  aspirin  EC  81 mg Oral Daily   atorvastatin   80 mg Oral Daily   budesonide -glycopyrrolate -formoterol   2 puff Inhalation BID  busPIRone   30 mg Oral q AM   And   busPIRone   15 mg Oral Q1200   And   busPIRone   15 mg Oral QPM   doxepin   75 mg Oral QHS   doxycycline   100 mg Oral Q12H   enoxaparin  (LOVENOX ) injection  40 mg Subcutaneous Q24H   escitalopram   20 mg Oral Daily   gabapentin   600 mg Oral BID   insulin  aspart  0-5 Units Subcutaneous QHS   insulin  aspart  0-6 Units Subcutaneous TID WC   insulin  glargine  5 Units Subcutaneous Daily   ipratropium-albuterol   3 mL Nebulization TID   levothyroxine   112 mcg Oral Daily   methylPREDNISolone  (SOLU-MEDROL ) injection  125 mg Intravenous Q24H   mirtazapine   45 mg Oral QHS   montelukast   10 mg Oral QHS   nebivolol   10 mg Oral Daily   pantoprazole   40 mg Oral BID AC   sodium chloride  flush  3 mL Intravenous Q12H   ticagrelor   60 mg Oral BID   torsemide   20 mg Oral Daily   Continuous Infusions:  cefTRIAXone  (ROCEPHIN )  IV 2 g (08/08/24 0030)     LOS: 1 day      Brayton Lye, MD [facility]   To contact the attending provider between 7A-7P or the covering provider during after hours 7P-7A, please log into the web  site [email or web address] and access using universal [place] password for that web site. If you do not have the password, please call the hospital operator.  08/08/2024, 1:01 PM   "

## 2024-08-09 ENCOUNTER — Inpatient Hospital Stay (HOSPITAL_COMMUNITY)

## 2024-08-09 DIAGNOSIS — J441 Chronic obstructive pulmonary disease with (acute) exacerbation: Secondary | ICD-10-CM | POA: Diagnosis not present

## 2024-08-09 LAB — PROCALCITONIN: Procalcitonin: <0.1 ng/mL

## 2024-08-09 LAB — CBC
HCT: 29.8 % — ABNORMAL LOW (ref 36.0–46.0)
Hemoglobin: 9.5 g/dL — ABNORMAL LOW (ref 12.0–15.0)
MCH: 28.5 pg (ref 26.0–34.0)
MCHC: 31.9 g/dL (ref 30.0–36.0)
MCV: 89.5 fL (ref 80.0–100.0)
Platelets: 474 10*3/uL — ABNORMAL HIGH (ref 150–400)
RBC: 3.33 MIL/uL — ABNORMAL LOW (ref 3.87–5.11)
RDW: 15.8 % — ABNORMAL HIGH (ref 11.5–15.5)
WBC: 18.4 10*3/uL — ABNORMAL HIGH (ref 4.0–10.5)
nRBC: 0 % (ref 0.0–0.2)

## 2024-08-09 LAB — GLUCOSE, CAPILLARY
Glucose-Capillary: 193 mg/dL — ABNORMAL HIGH (ref 70–99)
Glucose-Capillary: 359 mg/dL — ABNORMAL HIGH (ref 70–99)
Glucose-Capillary: 179 mg/dL — ABNORMAL HIGH (ref 70–99)
Glucose-Capillary: 291 mg/dL — ABNORMAL HIGH (ref 70–99)

## 2024-08-09 LAB — BASIC METABOLIC PANEL WITH GFR
Anion gap: 9 (ref 5–15)
BUN: 33 mg/dL — ABNORMAL HIGH (ref 8–23)
CO2: 33 mmol/L — ABNORMAL HIGH (ref 22–32)
Calcium: 9 mg/dL (ref 8.9–10.3)
Chloride: 98 mmol/L (ref 98–111)
Creatinine, Ser: 1.22 mg/dL — ABNORMAL HIGH (ref 0.44–1.00)
GFR, Estimated: 48 mL/min — ABNORMAL LOW
Glucose, Bld: 134 mg/dL — ABNORMAL HIGH (ref 70–99)
Potassium: 3.8 mmol/L (ref 3.5–5.1)
Sodium: 141 mmol/L (ref 135–145)

## 2024-08-09 MED ORDER — METHYLPREDNISOLONE SODIUM SUCC 125 MG IJ SOLR
125.0000 mg | INTRAMUSCULAR | Status: DC
  Administered 2024-08-10 – 2024-08-11 (×2): 125 mg via INTRAVENOUS
  Filled 2024-08-09 (×2): qty 2

## 2024-08-09 MED ORDER — METHYLPREDNISOLONE SODIUM SUCC 125 MG IJ SOLR: 80.0000 mg | Freq: Three times a day (TID) | INTRAMUSCULAR | Status: DC

## 2024-08-09 MED ORDER — METHYLPREDNISOLONE SODIUM SUCC 125 MG IJ SOLR
80.0000 mg | Freq: Three times a day (TID) | INTRAMUSCULAR | Status: AC
  Administered 2024-08-09 (×2): 80 mg via INTRAVENOUS
  Filled 2024-08-09 (×2): qty 2

## 2024-08-09 NOTE — Inpatient Diabetes Management (Signed)
 Inpatient Diabetes Program Recommendations  AACE/ADA: New Consensus Statement on Inpatient Glycemic Control  Target Ranges:  Prepandial:   less than 140 mg/dL      Peak postprandial:   less than 180 mg/dL (1-2 hours)      Critically ill patients:  140 - 180 mg/dL    Latest Reference Range & Units 08/08/24 07:51 08/08/24 11:38 08/08/24 16:23 08/08/24 20:39 08/09/24 08:35 08/09/24 12:06  Glucose-Capillary 70 - 99 mg/dL 853 (H) 780 (H) 777 (H) 222 (H) 193 (H) 359 (H)   Review of Glycemic Control  Diabetes history: DM2 Outpatient Diabetes medications: Metformin  500 mg QAM Current orders for Inpatient glycemic control: Lantus  5 units daily, Novolog  0-6 units TID with meals, Novolog  0-5 units at bedtime; Solumedrol 80 mg Q24H  Inpatient Diabetes Program Recommendations:    Insulin : If steroids are continued, please consider ordering Novolog  3 units TID with meals for meal coverage if patient eats at least 50% of meals.  Thanks, Earnie Gainer, RN, MSN, CDCES Diabetes Coordinator Inpatient Diabetes Program [phone] (Team Pager from 8am to 5pm)

## 2024-08-09 NOTE — Plan of Care (Signed)

## 2024-08-09 NOTE — Progress Notes (Signed)
 " PROGRESS NOTE    [name] [name]  FMW:[id number] DOB: 1956-05-06 DOA: 08/06/2024 PCP: Vicci Barnie [name], MD  Chief Complaint  Patient presents with   Hypotension   Shortness of Breath    Brief Narrative:    [name] [name] is a 69 y.o. female with medical history significant for asthma COPD overlap, chronic hypoxic and hypercarbic respiratory failure, chronic HFpEF, CAD, depression, anxiety, type 2 diabetes mellitus, and hypothyroidism who presents with cough and shortness of breath.  In ED her workup was significant for leukocytosis, elevated lactic acid, and imaging significant for pneumonia  Assessment & Plan:   Principal Problem:   COPD exacerbation (HCC) Active Problems:   Acute on chronic respiratory failure with hypoxia and hypercapnia (HCC)   (HFpEF) heart failure with preserved ejection fraction (HCC)   Acquired hypothyroidism   Essential hypertension   Non-insulin  dependent type 2 diabetes mellitus (HCC)   Anxiety and depression   Pneumonia   Hypokalemia   Sepsis, POA Pneumonia COPD exacerbation Chronic respiratory failure with hypoxia - Sepsis, POA, has leukocytosis, elevated lactic acid, low-grade temperature - CT chest negative for PE but significant for up pneumonia - With worsening wheezing, dyspnea and cough, will give extra dose of IV Solu-Medrol  this afternoon, and increase her Solu-Medrol  back to 125 mg daily, will repeat x-ray giving worsening respiratory status.   - Continue with IV Rocephin  and doxycycline , follow on sputum cultures, strep and Legionella antigen. - She was encouraged to use incentive spirometry and flutter valve -Patient with worsening leukocytosis, but this can be related to steroid, will check procalcitonin.   Chronic HFpEF  - Appears compensated  - Does appear dry today, with worsening renal function, so I will hold torsemide    Type II DM  - A1c was 6.5% in November 2025  - CBG remains elevated likely due to acute infection and IV  steroids   Depression, anxiety  - Continue doxepin , Lexapro , Remeron , Buspar , and as-needed Klonopin      Hypokalemia - Replacing,    Hypothyroidism  - Synthroid    CAD, HTN, HLD -  Continued Zetia , Lipitor , Bystolic , Demadex , aspirin , Brilinta    Patient quit smoking last month, she was congratulated about that.  DVT prophylaxis: (Lovenox ) Code Status: (Full) Family Communication: (None at bedside Disposition:      Consultants:  None   Subjective:  Patient reports she is not feeling better today, actually reports increased cough, congestion and productive phlegm, increased dyspnea   Objective: Vitals:   08/08/24 2200 08/08/24 2326 08/09/24 0400 08/09/24 0837  BP: (!) 132/57 131/62 (!) 117/56   Pulse: 72 63 65   Resp: 19 20 18    Temp:  (!) 97.5 F (36.4 C) 98.2 F (36.8 C) 97.8 F (36.6 C)  TempSrc:  Oral Oral Oral  SpO2: 100% 99% 95%   Weight:      Height:        Intake/Output Summary (Last 24 hours) at 08/09/2024 1322 Last data filed at 08/09/2024 1000 Gross per 24 hour  Intake 3 ml  Output --  Net 3 ml   Filed Weights   08/06/24 2256  Weight: 75.9 kg    Examination:  Awake Alert, Oriented X 3, frail, no apparent distress Diminished air entry, with significant wheezing bilaterally RRR,No Gallops +ve B.Sounds, Abd Soft. No Cyanosis, Clubbing or edema, significant skin bruising at baseline   Data Reviewed: I have personally reviewed following labs and imaging studies  CBC: Recent Labs  Lab 08/06/24 1720 08/06/24 2054 08/07/24 0541  08/09/24 0224  WBC 16.6*  --  13.6* 18.4*  NEUTROABS 12.4*  --   --   --   HGB 11.7* 12.6 10.6* 9.5*  HCT 36.4 37.0 33.0* 29.8*  MCV 90.3  --  89.7 89.5  PLT 474*  --  440* 474*    Basic Metabolic Panel: Recent Labs  Lab 08/06/24 1720 08/06/24 2054 08/07/24 0541 08/08/24 0616 08/09/24 0224  NA 137 134* 138 140 141  K 2.9* 2.9* 3.6 3.8 3.8  CL 89*  --  94* 96* 98  CO2 35*  --  34* 31 33*  GLUCOSE  113*  --  173* 143* 134*  BUN 17  --  20 27* 33*  CREATININE 1.08*  --  0.87 1.04* 1.22*  CALCIUM  9.2  --  9.4 9.8 9.0  MG 1.7  --  2.3  --   --     GFR: Estimated Creatinine Clearance: 41.1 mL/min (A) (by C-G formula based on SCr of 1.22 mg/dL (H)).  Liver Function Tests: Recent Labs  Lab 08/06/24 1720  AST 18  ALT 36  ALKPHOS 86  BILITOT 0.4  PROT 6.9  ALBUMIN 4.0    CBG: Recent Labs  Lab 08/08/24 1138 08/08/24 1623 08/08/24 2039 08/09/24 0835 08/09/24 1206  GLUCAP 219* 222* 222* 193* 359*     Recent Results (from the past 240 hours)  Resp panel by RT-PCR (RSV, Flu A&B, Covid) Anterior Nasal Swab     Status: None   Collection Time: 08/06/24  5:21 PM   Specimen: Anterior Nasal Swab  Result Value Ref Range Status   SARS Coronavirus 2 by RT PCR NEGATIVE NEGATIVE Final   Influenza A by PCR NEGATIVE NEGATIVE Final   Influenza B by PCR NEGATIVE NEGATIVE Final    Comment: (NOTE) The Xpert Xpress SARS-CoV-2/FLU/RSV plus assay is intended as an aid in the diagnosis of influenza from Nasopharyngeal swab specimens and should not be used as a sole basis for treatment. Nasal washings and aspirates are unacceptable for Xpert Xpress SARS-CoV-2/FLU/RSV testing.  Fact Sheet for Patients: bloggercourse.com  Fact Sheet for Healthcare Providers: seriousbroker.it  This test is not yet approved or cleared by the [place]  FDA and has been authorized for detection and/or diagnosis of SARS-CoV-2 by FDA under an Emergency Use Authorization (EUA). This EUA will remain in effect (meaning this test can be used) for the duration of the COVID-19 declaration under Section 564(b)(1) of the Act, 21 U.S.C. section 360bbb-3(b)(1), unless the authorization is terminated or revoked.     Resp Syncytial Virus by PCR NEGATIVE NEGATIVE Final    Comment: (NOTE) Fact Sheet for Patients: bloggercourse.com  Fact  Sheet for Healthcare Providers: seriousbroker.it  This test is not yet approved or cleared by the [place]  FDA and has been authorized for detection and/or diagnosis of SARS-CoV-2 by FDA under an Emergency Use Authorization (EUA). This EUA will remain in effect (meaning this test can be used) for the duration of the COVID-19 declaration under Section 564(b)(1) of the Act, 21 U.S.C. section 360bbb-3(b)(1), unless the authorization is terminated or revoked.  Performed at [facility], [address], [place], [place] [address]   Blood culture (routine x 2)     Status: None (Preliminary result)   Collection Time: 08/06/24  6:13 PM   Specimen: BLOOD  Result Value Ref Range Status   Specimen Description BLOOD RIGHT ANTECUBITAL  Final   Special Requests   Final    BOTTLES DRAWN AEROBIC AND ANAEROBIC  Blood Culture results may not be optimal due to an inadequate volume of blood received in culture bottles   Culture   Final    NO GROWTH 2 DAYS Performed at [facility], [address], [place], [place] [address]    Report Status PENDING  Incomplete  Blood culture (routine x 2)     Status: None (Preliminary result)   Collection Time: 08/06/24  8:49 PM   Specimen: BLOOD LEFT ARM  Result Value Ref Range Status   Specimen Description BLOOD LEFT ARM  Final   Special Requests   Final    BOTTLES DRAWN AEROBIC AND ANAEROBIC Blood Culture adequate volume   Culture   Final    NO GROWTH 2 DAYS Performed at [facility], [address] [place], [place] [address]    Report Status PENDING  Incomplete         Radiology Studies: No results found.       Scheduled Meds:  aspirin  EC  81 mg Oral Daily   atorvastatin   80 mg Oral Daily   budesonide -glycopyrrolate -formoterol   2 puff Inhalation BID   busPIRone   30 mg Oral q AM   And   busPIRone   15 mg Oral Q1200   And   busPIRone   15 mg Oral QPM   doxepin   75 mg Oral QHS   doxycycline   100  mg Oral Q12H   enoxaparin  (LOVENOX ) injection  40 mg Subcutaneous Q24H   escitalopram   20 mg Oral Daily   gabapentin   600 mg Oral BID   insulin  aspart  0-5 Units Subcutaneous QHS   insulin  aspart  0-6 Units Subcutaneous TID WC   insulin  glargine  5 Units Subcutaneous Daily   ipratropium-albuterol   3 mL Nebulization TID   levothyroxine   112 mcg Oral Daily   methylPREDNISolone  (SOLU-MEDROL ) injection  80 mg Intravenous Q24H   mirtazapine   45 mg Oral QHS   montelukast   10 mg Oral QHS   nebivolol   10 mg Oral Daily   pantoprazole   40 mg Oral BID AC   sodium chloride  flush  3 mL Intravenous Q12H   ticagrelor   60 mg Oral BID   Continuous Infusions:  cefTRIAXone  (ROCEPHIN )  IV 2 g (08/08/24 2328)     LOS: 2 days      Brayton Lye, MD [facility]   To contact the attending provider between 7A-7P or the covering provider during after hours 7P-7A, please log into the web site [email or web address] and access using universal [place] password for that web site. If you do not have the password, please call the hospital operator.  08/09/2024, 1:22 PM   "

## 2024-08-10 ENCOUNTER — Encounter: Payer: Self-pay | Admitting: Student in an Organized Health Care Education/Training Program

## 2024-08-10 LAB — LEGIONELLA PNEUMOPHILA SEROGP 1 UR AG: L. pneumophila Serogp 1 Ur Ag: NEGATIVE

## 2024-08-10 LAB — BASIC METABOLIC PANEL WITH GFR
Anion gap: 7 (ref 5–15)
BUN: 24 mg/dL — ABNORMAL HIGH (ref 8–23)
CO2: 34 mmol/L — ABNORMAL HIGH (ref 22–32)
Calcium: 9.3 mg/dL (ref 8.9–10.3)
Chloride: 99 mmol/L (ref 98–111)
Creatinine, Ser: 0.79 mg/dL (ref 0.44–1.00)
GFR, Estimated: >60 mL/min
Glucose, Bld: 236 mg/dL — ABNORMAL HIGH (ref 70–99)
Potassium: 4.6 mmol/L (ref 3.5–5.1)
Sodium: 140 mmol/L (ref 135–145)

## 2024-08-10 LAB — GLUCOSE, CAPILLARY
Glucose-Capillary: 367 mg/dL — ABNORMAL HIGH (ref 70–99)
Glucose-Capillary: 309 mg/dL — ABNORMAL HIGH (ref 70–99)
Glucose-Capillary: 224 mg/dL — ABNORMAL HIGH (ref 70–99)
Glucose-Capillary: 277 mg/dL — ABNORMAL HIGH (ref 70–99)

## 2024-08-10 LAB — CBC
HCT: 31 % — ABNORMAL LOW (ref 36.0–46.0)
Hemoglobin: 9.7 g/dL — ABNORMAL LOW (ref 12.0–15.0)
MCH: 28.8 pg (ref 26.0–34.0)
MCHC: 31.3 g/dL (ref 30.0–36.0)
MCV: 92 fL (ref 80.0–100.0)
Platelets: 497 10*3/uL — ABNORMAL HIGH (ref 150–400)
RBC: 3.37 MIL/uL — ABNORMAL LOW (ref 3.87–5.11)
RDW: 15.9 % — ABNORMAL HIGH (ref 11.5–15.5)
WBC: 18.3 10*3/uL — ABNORMAL HIGH (ref 4.0–10.5)
nRBC: 0 % (ref 0.0–0.2)

## 2024-08-10 LAB — HEMOGLOBIN A1C
Hgb A1c MFr Bld: 7.6 % — ABNORMAL HIGH (ref 4.8–5.6)
Mean Plasma Glucose: 171.42 mg/dL

## 2024-08-10 LAB — PROCALCITONIN: Procalcitonin: <0.1 ng/mL

## 2024-08-10 LAB — PRO BRAIN NATRIURETIC PEPTIDE: Pro Brain Natriuretic Peptide: 595 pg/mL — ABNORMAL HIGH

## 2024-08-10 MED ORDER — EZETIMIBE 10 MG PO TABS
10.0000 mg | ORAL_TABLET | Freq: Every day | ORAL | Status: DC
  Administered 2024-08-10 – 2024-08-12 (×3): 10 mg via ORAL
  Filled 2024-08-10 (×3): qty 1

## 2024-08-10 MED ORDER — TORSEMIDE 20 MG PO TABS
20.0000 mg | ORAL_TABLET | Freq: Every day | ORAL | Status: DC
  Administered 2024-08-11 – 2024-08-12 (×2): 20 mg via ORAL
  Filled 2024-08-10 (×2): qty 1

## 2024-08-10 MED ORDER — FUROSEMIDE 10 MG/ML IJ SOLN
40.0000 mg | Freq: Once | INTRAMUSCULAR | Status: AC
  Administered 2024-08-10: 40 mg via INTRAVENOUS
  Filled 2024-08-10: qty 4

## 2024-08-10 MED ORDER — NITROGLYCERIN 0.4 MG SL SUBL: 0.4000 mg | SUBLINGUAL_TABLET | SUBLINGUAL | Status: DC | PRN

## 2024-08-10 NOTE — Progress Notes (Signed)
 "                        PROGRESS NOTE        PATIENT DETAILS Name: [name] [name] Age: 69 y.o. Sex: female Date of Birth: 1955-09-19 Admit Date: 08/06/2024 Admitting Physician Evalene GORMAN Sprinkles, MD ERE:Gnywdnw, Barnie [name], MD  Brief Summary: Patient is a 69 y.o.  female with history of asthma/COPD overlap, chronic hypoxic respiratory failure on 3 L at baseline, chronic HFpEF, CAD, HLD, DM-2-who presented with shortness of breath-found to have COPD exacerbation and PNA.  Significant events 1/23>> admit to [facility]  Significant studies: 1/23>> CTA chest: No PE.  Peribronchial infiltrates with mucous plugging or debris in the lower lobe-possibly representing multifocal PNA or aspiration  Significant microbiology data: 1/23>> COVID/influenza/RSV PCR: Negative 1/23>> blood culture: No growth  Procedures: None  Consults: None  Subjective: Slowly proving-not yet back to baseline-continues to cough-whitish phlegm.  Objective: Vitals: Blood pressure (!) 154/68, pulse 79, temperature 97.6 F (36.4 C), temperature source Oral, resp. rate 19, height 5' 1 (1.549 m), weight 75.9 kg, SpO2 98%.   Exam: Gen Exam:Alert awake-not in any distress HEENT:atraumatic, normocephalic Chest: Moving air-coarse rhonchi all over. CVS:S1S2 regular Abdomen:soft non tender, non distended Extremities:no edema Neurology: Non focal Skin: no rash  Pertinent Labs/Radiology:    Latest Ref Rng & Units 08/11/2024    3:59 AM 08/10/2024    4:02 AM 08/09/2024    2:24 AM  CBC  WBC 4.0 - 10.5 K/uL 20.7  18.3  18.4   Hemoglobin 12.0 - 15.0 g/dL 89.5  9.7  9.5   Hematocrit 36.0 - 46.0 % 33.0  31.0  29.8   Platelets 150 - 400 K/uL 502  497  474     Lab Results  Component Value Date   NA 140 08/11/2024   K 4.1 08/11/2024   CL 99 08/11/2024   CO2 35 (H) 08/11/2024      Assessment/Plan: Sepsis secondary to PNA Sepsis physiology improved Worsening leukocytosis likely secondary to steroids All cultures  negative so far Continue Rocephin /doxycycline .  COPD exacerbation Although improved-still wheezing-not yet at baseline Continue steroids but transition to prednisone . Continue bronchodilators.  Chronic hypoxic respiratory failure on 3 L of oxygen  at baseline Supportive care-see above  Chronic HFpEF Euvolemic Continue diuretics-try to maintain negative balance.  CAD/with multiple PCI-most recent LHC in 2018 No anginal symptoms Continue aspirin /Brilinta /beta-blocker/Lipitor   HTN BP stable Bystolic   DM-2 (A1c 7.6 on 1/27) with uncontrolled hyperglycemia secondary to steroids. CBGs uncontrolled but relatively controlled this morning Since steroids are being rapidly tapered down-Will continue with current regimen of Lantus  5 units daily plus SSI and see how she does   Recent Labs    08/10/24 1642 08/10/24 2120 08/11/24 0733  GLUCAP 224* 277* 111*     Hypothyroidism Synthroid    GERD PPI  Mood disorder Stable BuSpar /doxepin /Remeron  As needed Klonopin   Class 1 Obesity: Estimated body mass index is 31.62 kg/m as calculated from the following:   Height as of this encounter: 5' 1 (1.549 m).   Weight as of this encounter: 75.9 kg.   Code status:   Code Status: Full Code   DVT Prophylaxis: enoxaparin  (LOVENOX ) injection 40 mg Start: 08/07/24 1000   Family Communication: None at bedside   Disposition Plan: Status is: Inpatient Remains inpatient appropriate because: Severity of illness   Planned Discharge Destination:Home health   Diet: Diet Order  Diet regular Room service appropriate? Yes; Fluid consistency: Thin  Diet effective now                     Antimicrobial agents: Anti-infectives (From admission, onward)    Start     Dose/Rate Route Frequency Ordered Stop   08/07/24 0015  cefTRIAXone  (ROCEPHIN ) 2 g in sodium chloride  0.9 % 100 mL IVPB        2 g 200 mL/hr over 30 Minutes Intravenous Every 24 hours 08/06/24 2326  08/11/24 0600   08/06/24 2215  doxycycline  (VIBRA -TABS) tablet 100 mg        100 mg Oral Every 12 hours 08/06/24 2207 08/11/24 0906        MEDICATIONS: Scheduled Meds:  aspirin  EC  81 mg Oral Daily   atorvastatin   80 mg Oral Daily   budesonide -glycopyrrolate -formoterol   2 puff Inhalation BID   busPIRone   30 mg Oral q AM   And   busPIRone   15 mg Oral Q1200   And   busPIRone   15 mg Oral QPM   doxepin   75 mg Oral QHS   enoxaparin  (LOVENOX ) injection  40 mg Subcutaneous Q24H   escitalopram   20 mg Oral Daily   ezetimibe   10 mg Oral Daily   gabapentin   600 mg Oral BID   insulin  aspart  0-5 Units Subcutaneous QHS   insulin  aspart  0-6 Units Subcutaneous TID WC   insulin  glargine  5 Units Subcutaneous Daily   ipratropium-albuterol   3 mL Nebulization TID   levothyroxine   112 mcg Oral Daily   methylPREDNISolone  (SOLU-MEDROL ) injection  125 mg Intravenous Q24H   mirtazapine   45 mg Oral QHS   montelukast   10 mg Oral QHS   nebivolol   10 mg Oral Daily   pantoprazole   40 mg Oral BID AC   sodium chloride  flush  3 mL Intravenous Q12H   ticagrelor   60 mg Oral BID   torsemide   20 mg Oral Daily   Continuous Infusions:   PRN Meds:.acetaminophen  **OR** acetaminophen , clonazePAM , guaiFENesin , ipratropium-albuterol , nitroGLYCERIN , senna-docusate   I have personally reviewed following labs and imaging studies  LABORATORY DATA: CBC: Recent Labs  Lab 08/06/24 1720 08/06/24 2054 08/07/24 0541 08/09/24 0224 08/10/24 0402 08/11/24 0359  WBC 16.6*  --  13.6* 18.4* 18.3* 20.7*  NEUTROABS 12.4*  --   --   --   --   --   HGB 11.7* 12.6 10.6* 9.5* 9.7* 10.4*  HCT 36.4 37.0 33.0* 29.8* 31.0* 33.0*  MCV 90.3  --  89.7 89.5 92.0 91.9  PLT 474*  --  440* 474* 497* 502*    Basic Metabolic Panel: Recent Labs  Lab 08/06/24 1720 08/06/24 2054 08/07/24 0541 08/08/24 0616 08/09/24 0224 08/10/24 0402 08/11/24 0359  NA 137   < > 138 140 141 140 140  K 2.9*   < > 3.6 3.8 3.8 4.6 4.1  CL  89*  --  94* 96* 98 99 99  CO2 35*  --  34* 31 33* 34* 35*  GLUCOSE 113*  --  173* 143* 134* 236* 159*  BUN 17  --  20 27* 33* 24* 28*  CREATININE 1.08*  --  0.87 1.04* 1.22* 0.79 0.84  CALCIUM  9.2  --  9.4 9.8 9.0 9.3 9.5  MG 1.7  --  2.3  --   --   --   --    < > = values in this interval not displayed.    GFR: Estimated Creatinine  Clearance: 59.7 mL/min (by C-G formula based on SCr of 0.84 mg/dL).  Liver Function Tests: Recent Labs  Lab 08/06/24 1720  AST 18  ALT 36  ALKPHOS 86  BILITOT 0.4  PROT 6.9  ALBUMIN 4.0   No results for input(s): LIPASE, AMYLASE in the last 168 hours. No results for input(s): AMMONIA in the last 168 hours.  Coagulation Profile: No results for input(s): INR, PROTIME in the last 168 hours.  Cardiac Enzymes: No results for input(s): CKTOTAL, CKMB, CKMBINDEX, TROPONINI in the last 168 hours.  BNP (last 3 results) Recent Labs    07/21/24 0900 08/06/24 1720 08/10/24 0402  PROBNP 349.0* 236.0 595.0*    Lipid Profile: No results for input(s): CHOL, HDL, LDLCALC, TRIG, CHOLHDL, LDLDIRECT in the last 72 hours.  Thyroid  Function Tests: No results for input(s): TSH, T4TOTAL, FREET4, T3FREE, THYROIDAB in the last 72 hours.  Anemia Panel: No results for input(s): VITAMINB12, FOLATE, FERRITIN, TIBC, IRON , RETICCTPCT in the last 72 hours.  Urine analysis:    Component Value Date/Time   COLORURINE STRAW (A) 11/26/2023 1250   APPEARANCEUR CLEAR 11/26/2023 1250   LABSPEC 1.005 11/26/2023 1250   PHURINE 6.0 11/26/2023 1250   GLUCOSEU NEGATIVE 11/26/2023 1250   HGBUR NEGATIVE 11/26/2023 1250   BILIRUBINUR NEGATIVE 11/26/2023 1250   BILIRUBINUR negative 11/24/2020 1018   KETONESUR NEGATIVE 11/26/2023 1250   PROTEINUR NEGATIVE 11/26/2023 1250   UROBILINOGEN 0.2 11/24/2020 1018   UROBILINOGEN 0.2 08/09/2014 1028   NITRITE NEGATIVE 11/26/2023 1250   LEUKOCYTESUR NEGATIVE 11/26/2023 1250     Sepsis Labs: Lactic Acid, Venous    Component Value Date/Time   LATICACIDVEN 2.3 (HH) 08/06/2024 2055    MICROBIOLOGY: Recent Results (from the past 240 hours)  Resp panel by RT-PCR (RSV, Flu A&B, Covid) Anterior Nasal Swab     Status: None   Collection Time: 08/06/24  5:21 PM   Specimen: Anterior Nasal Swab  Result Value Ref Range Status   SARS Coronavirus 2 by RT PCR NEGATIVE NEGATIVE Final   Influenza A by PCR NEGATIVE NEGATIVE Final   Influenza B by PCR NEGATIVE NEGATIVE Final    Comment: (NOTE) The Xpert Xpress SARS-CoV-2/FLU/RSV plus assay is intended as an aid in the diagnosis of influenza from Nasopharyngeal swab specimens and should not be used as a sole basis for treatment. Nasal washings and aspirates are unacceptable for Xpert Xpress SARS-CoV-2/FLU/RSV testing.  Fact Sheet for Patients: bloggercourse.com  Fact Sheet for Healthcare Providers: seriousbroker.it  This test is not yet approved or cleared by the [place]  FDA and has been authorized for detection and/or diagnosis of SARS-CoV-2 by FDA under an Emergency Use Authorization (EUA). This EUA will remain in effect (meaning this test can be used) for the duration of the COVID-19 declaration under Section 564(b)(1) of the Act, 21 U.S.C. section 360bbb-3(b)(1), unless the authorization is terminated or revoked.     Resp Syncytial Virus by PCR NEGATIVE NEGATIVE Final    Comment: (NOTE) Fact Sheet for Patients: bloggercourse.com  Fact Sheet for Healthcare Providers: seriousbroker.it  This test is not yet approved or cleared by the [place]  FDA and has been authorized for detection and/or diagnosis of SARS-CoV-2 by FDA under an Emergency Use Authorization (EUA). This EUA will remain in effect (meaning this test can be used) for the duration of the COVID-19 declaration under Section 564(b)(1) of  the Act, 21 U.S.C. section 360bbb-3(b)(1), unless the authorization is terminated or revoked.  Performed at [facility], [address] [place], [place] [address]   Blood culture (routine x 2)     Status: None   Collection Time: 08/06/24  6:13 PM   Specimen: BLOOD  Result Value Ref Range Status   Specimen Description BLOOD RIGHT ANTECUBITAL  Final   Special Requests   Final    BOTTLES DRAWN AEROBIC AND ANAEROBIC Blood Culture results may not be optimal due to an inadequate volume of blood received in culture bottles   Culture   Final    NO GROWTH 5 DAYS Performed at [facility], [address], [place], [place] [address]    Report Status 08/11/2024 FINAL  Final  Blood culture (routine x 2)     Status: None   Collection Time: 08/06/24  8:49 PM   Specimen: BLOOD LEFT ARM  Result Value Ref Range Status   Specimen Description BLOOD LEFT ARM  Final   Special Requests   Final    BOTTLES DRAWN AEROBIC AND ANAEROBIC Blood Culture adequate volume   Culture   Final    NO GROWTH 5 DAYS Performed at [facility], [address] [place], [place] [address]    Report Status 08/11/2024 FINAL  Final    RADIOLOGY STUDIES/RESULTS: DG Chest Port 1 View Result Date: 08/09/2024 EXAM: 1 VIEW(S) XRAY OF THE CHEST 08/09/2024 01:38:00 PM COMPARISON: 08/06/2024 CLINICAL HISTORY: Dyspnea. FINDINGS: LUNGS AND PLEURA: Persistent bibasilar atelectasis, left greater than right. No significant pleural effusion. No interstitial edema. No consolidative change. No pneumothorax. HEART AND MEDIASTINUM: Aortic atherosclerotic calcification. No acute abnormality of the cardiac and mediastinal silhouettes. BONES AND SOFT TISSUES: Remote healed midshaft of left clavicle fracture. No acute osseous abnormality. IMPRESSION: 1. Persistent bibasilar atelectasis, left greater than right. Electronically signed by: Waddell Calk MD 08/09/2024 01:40 PM EST RP Workstation: [id number]     LOS: 4 days    Donalda Applebaum, MD  [facility]    To contact the attending provider between 7A-7P or the covering provider during after hours 7P-7A, please log into the web site [email or web address] and access using universal [place] password for that web site. If you do not have the password, please call the hospital operator.  08/11/2024, 9:55 AM    "

## 2024-08-10 NOTE — Plan of Care (Signed)

## 2024-08-10 NOTE — Plan of Care (Signed)
  Problem: Health Behavior/Discharge Planning: Goal: Ability to identify and utilize available resources and services will improve Outcome: Progressing Goal: Ability to manage health-related needs will improve Outcome: Progressing   Problem: Metabolic: Goal: Ability to maintain appropriate glucose levels will improve Outcome: Progressing   Problem: Nutritional: Goal: Maintenance of adequate nutrition will improve Outcome: Progressing   

## 2024-08-10 NOTE — TOC Initial Note (Signed)
 Transition of Care [facility]) - Initial/Assessment Note    Patient Details  Name: [name] [name] MRN: [id number] Date of Birth: 02-Apr-1956  Transition [facility] Care [facility] CM/SW Contact:    [name], RN Phone Number: 08/10/2024, 9:48 AM  Clinical Narrative:                 RR:[id number] history significant for asthma COPD overlap, chronic hypoxic and hypercarbic respiratory failure, chronic HFpEF, CAD, depression, anxiety, type 2 diabetes mellitus, and hypothyroidism who presents with cough and shortness of breath.   Patient lives alone, reports having her daughter for support when needed and she will be transportation home at discharge.   Patient has PCP, manages own medications, home DME reviewed-RW, rollator, BSC, shower bench, Home O2 4-5L through [employer]. Patient does report her daughter will bring concentrator for transportation home.   HH rec per therapy. Patient active with [facility], verified in [facility]. Referral to be sent to [facility].   Continued medical workup. CM will continue to follow.  Expected Discharge Plan: Home w Home Health Services Barriers to Discharge: Continued Medical Work up   Patient Goals and CMS Choice   CMS Medicare.gov Compare Post Acute Care list provided to:: Patient Choice offered to / list presented to : Patient      Expected Discharge Plan and Services       Living arrangements for the past 2 months: Apartment                                      Prior Living Arrangements/Services Living arrangements for the past 2 months: Apartment Lives with:: Self Patient language and need for interpreter reviewed:: Yes Do you feel safe going back to the place where you live?: Yes      Need for Family Participation in Patient Care: Yes (Comment) Care giver support system in place?: Yes (comment) Current home services: DME (RW, rollator, BSC, shower bench, home O2 through [employer] 4-5L) Criminal Activity/Legal Involvement Pertinent to Current  Situation/Hospitalization: No - Comment as needed  Activities of Daily Living   ADL Screening (condition at time of admission) Independently performs ADLs?: Yes (appropriate for developmental age) Is the patient deaf or have difficulty hearing?: No Does the patient have difficulty seeing, even when wearing glasses/contacts?: No Does the patient have difficulty concentrating, remembering, or making decisions?: No  Permission Sought/Granted                  Emotional Assessment Appearance:: Developmentally appropriate Attitude/Demeanor/Rapport: Engaged Affect (typically observed): Calm Orientation: : Oriented to Situation, Oriented to  Time, Oriented to Place, Oriented to Self Alcohol / Substance Use: Tobacco Use Psych Involvement: No (comment)  Admission diagnosis:  COPD exacerbation (HCC) [J44.1] Patient Active Problem List   Diagnosis Date Noted   COPD exacerbation (HCC) 08/06/2024   Hypomagnesemia 07/21/2024   Obesity, Class II, BMI 35-39.9 07/20/2024   (HFpEF) heart failure with preserved ejection fraction (HCC) 06/18/2024   Non-insulin  dependent type 2 diabetes mellitus (HCC) 06/18/2024   History of depression 06/18/2024   Diabetic neuropathy (HCC) 06/18/2024   Hypokalemia 01/08/2024   Acute on chronic heart failure with preserved ejection fraction (HFpEF, >= 50%) (HCC) 12/05/2023   At risk for polypharmacy 12/04/2023   Long-term current use of antidepressant 11/17/2023   Long-term current use of benzodiazepine 11/17/2023   CKD (chronic kidney disease) stage 2, GFR 60-89 ml/min 11/12/2023   Acute on chronic  respiratory failure with hypoxia and hypercapnia (HCC) 10/05/2023   Chronic hypoxic respiratory failure (HCC) 09/01/2023   Continuous dependence on cigarette smoking 09/01/2023   History of CAD (coronary artery disease) 09/01/2023   Pneumonia 09/01/2023   Rhinovirus infection 06/20/2023   Full code status 06/14/2023   Nicotine  dependence 05/03/2023   Iron   deficiency anemia 11/02/2021   Major depressive disorder, recurrent, in full remission with anxious distress 05/31/2021   Chronic neck pain 02/26/2021   GAD (generalized anxiety disorder) 01/08/2021   Esophageal dysphagia 04/28/2019   Bipolar disorder (HCC) 09/17/2018   Insomnia 10/28/2017   Prediabetes 05/28/2017   Advanced directives, counseling/discussion 09/11/2015   Psoriasis 06/22/2015   COPD (chronic obstructive pulmonary disease) (HCC)GOLD E 05/18/2015   Anxiety and depression 07/10/2013   Essential hypertension 06/07/2013   Hyperlipidemia 06/07/2013   Asthma, chronic 06/07/2013   Emphysema lung (HCC) 06/07/2013   Chronic back pain 06/07/2013   CAD S/P percutaneous coronary angioplasty 06/07/2013   GERD (gastroesophageal reflux disease) 06/07/2013   Breast lump on left side at 3 o'clock position 10/13/2012   COPD with acute exacerbation (HCC) 09/15/2009   TOBACCO ABUSE 07/17/2009   Chronic rhinitis 07/17/2009   Lung nodule < 6cm on CT 07/17/2009   Acquired hypothyroidism 12/22/2008   Chronic depression 12/22/2008   PCP:  Vicci Barnie NOVAK, MD Pharmacy:   [facility] [address] [place] [address] Phone: [phone] Fax: [phone]  [facility] [place], [place] - [address] [place] [address] Phone: [phone] Fax: [phone]  [facility] [address] [place] [address] Phone: [phone] Fax: [phone]     Social Drivers of Health (SDOH) Social History: SDOH Screenings   Food Insecurity: No Food Insecurity (08/06/2024)  Recent Concern: Food Insecurity - Food Insecurity Present (06/18/2024)  Housing: Low Risk (08/06/2024)  Transportation Needs: No Transportation Needs (08/06/2024)  Utilities: Not At Risk (08/06/2024)  Alcohol Screen: Low Risk (06/18/2024)  Depression (PHQ2-9): Low Risk (07/02/2024)  Recent Concern: Depression (PHQ2-9) -  Medium Risk (05/18/2024)  Financial Resource Strain: Medium Risk (06/18/2024)  Physical Activity: Inactive (06/18/2024)  Social Connections: Moderately Isolated (08/06/2024)  Stress: No Stress Concern Present (06/18/2024)  Tobacco Use: High Risk (08/06/2024)  Health Literacy: Inadequate Health Literacy (08/07/2023)   SDOH Interventions:     Readmission Risk Interventions    07/26/2024    9:14 AM 06/20/2023    3:17 PM  Readmission Risk Prevention Plan  Transportation Screening Complete Complete  HRI or Home Care Consult  Complete  Social Work Consult for Recovery Care Planning/Counseling  Complete  Palliative Care Screening  Not Applicable  Medication Review Oceanographer) Complete Complete  PCP or Specialist appointment within 3-5 days of discharge Complete   HRI or Home Care Consult Complete   SW Recovery Care/Counseling Consult Complete   Palliative Care Screening Complete   Skilled Nursing Facility Not Applicable

## 2024-08-10 NOTE — Progress Notes (Signed)
 " PROGRESS NOTE    [name] [name]  FMW:[id number] DOB: 1956/03/05 DOA: 08/06/2024 PCP: Vicci Barnie [name], MD  Chief Complaint  Patient presents with   Hypotension   Shortness of Breath    Brief Narrative:    [name] [name] is a 69 y.o. female with medical history significant for asthma COPD overlap, chronic hypoxic and hypercarbic respiratory failure, chronic HFpEF, CAD, depression, anxiety, type 2 diabetes mellitus, and hypothyroidism who presents with cough and shortness of breath.  In ED her workup was significant for leukocytosis, elevated lactic acid, and imaging significant for pneumonia  Assessment & Plan:   Principal Problem:   COPD exacerbation (HCC) Active Problems:   Acute on chronic respiratory failure with hypoxia and hypercapnia (HCC)   (HFpEF) heart failure with preserved ejection fraction (HCC)   Acquired hypothyroidism   Essential hypertension   Non-insulin  dependent type 2 diabetes mellitus (HCC)   Anxiety and depression   Pneumonia   Hypokalemia   Sepsis, POA Pneumonia COPD exacerbation Chronic respiratory failure with hypoxia - Sepsis, POA, has leukocytosis, elevated lactic acid, low-grade temperature - CT chest negative for PE but significant for up pneumonia - Remains with significant wheezing, dyspneic, so we will continue with current dose Hypersal and Medrol  with no taper . - BNP is mildly elevated today will give 1 dose of Lasix   - Continue with IV Rocephin  and doxycycline  - She was encouraged to use incentive spirometry and flutter valve - Worsening leukocytosis most likely related to steroids procalcitonin reassuring   Chronic HFpEF  - Appears compensated  - proBNP is elevated will give 1 dose of IV Lasix  and resume on home torsemide    Type II DM  - A1c was 6.5% in November 2025, 7.6 this admission - CBG remains elevated likely due to acute infection and IV steroids   Depression, anxiety  - Continue doxepin , Lexapro , Remeron , Buspar , and  as-needed Klonopin      Hypokalemia - Replacing,    Hypothyroidism  - Synthroid    CAD, HTN, HLD -  Continued Zetia , Lipitor , Bystolic , Demadex , aspirin , Brilinta    Patient quit smoking last month, she was congratulated about that.  DVT prophylaxis: (Lovenox ) Code Status: (Full) Family Communication: (None at bedside Disposition:      Consultants:  None   Subjective:  Reports dyspnea, cough and congestion  Objective: Vitals:   08/10/24 0007 08/10/24 0437 08/10/24 0824 08/10/24 1141  BP: (!) 120/56 132/66 (!) 110/94 (!) 124/50  Pulse: 66 67 71 84  Resp: 17 19 20 20   Temp: 97.8 F (36.6 C) 98.3 F (36.8 C) 98.2 F (36.8 C) 98.4 F (36.9 C)  TempSrc: Oral Oral Oral Oral  SpO2: 98% 98% 99%   Weight:      Height:       No intake or output data in the 24 hours ending 08/10/24 1359  Filed Weights   08/06/24 2256  Weight: 75.9 kg    Examination:  Awake Alert, Oriented X 3, frail, no apparent distress He is with diffuse wheezing and diminished air entry RRR,No Gallops +ve B.Sounds, Abd Soft. No Cyanosis, Clubbing or edema, significant skin bruising at baseline   Data Reviewed: I have personally reviewed following labs and imaging studies  CBC: Recent Labs  Lab 08/06/24 1720 08/06/24 2054 08/07/24 0541 08/09/24 0224 08/10/24 0402  WBC 16.6*  --  13.6* 18.4* 18.3*  NEUTROABS 12.4*  --   --   --   --   HGB 11.7* 12.6 10.6* 9.5* 9.7*  HCT  36.4 37.0 33.0* 29.8* 31.0*  MCV 90.3  --  89.7 89.5 92.0  PLT 474*  --  440* 474* 497*    Basic Metabolic Panel: Recent Labs  Lab 08/06/24 1720 08/06/24 2054 08/07/24 0541 08/08/24 0616 08/09/24 0224 08/10/24 0402  NA 137 134* 138 140 141 140  K 2.9* 2.9* 3.6 3.8 3.8 4.6  CL 89*  --  94* 96* 98 99  CO2 35*  --  34* 31 33* 34*  GLUCOSE 113*  --  173* 143* 134* 236*  BUN 17  --  20 27* 33* 24*  CREATININE 1.08*  --  0.87 1.04* 1.22* 0.79  CALCIUM  9.2  --  9.4 9.8 9.0 9.3  MG 1.7  --  2.3  --   --   --      GFR: Estimated Creatinine Clearance: 62.7 mL/min (by C-G formula based on SCr of 0.79 mg/dL).  Liver Function Tests: Recent Labs  Lab 08/06/24 1720  AST 18  ALT 36  ALKPHOS 86  BILITOT 0.4  PROT 6.9  ALBUMIN 4.0    CBG: Recent Labs  Lab 08/09/24 1206 08/09/24 1612 08/09/24 2125 08/10/24 0823 08/10/24 1141  GLUCAP 359* 179* 291* 367* 309*     Recent Results (from the past 240 hours)  Resp panel by RT-PCR (RSV, Flu A&B, Covid) Anterior Nasal Swab     Status: None   Collection Time: 08/06/24  5:21 PM   Specimen: Anterior Nasal Swab  Result Value Ref Range Status   SARS Coronavirus 2 by RT PCR NEGATIVE NEGATIVE Final   Influenza A by PCR NEGATIVE NEGATIVE Final   Influenza B by PCR NEGATIVE NEGATIVE Final    Comment: (NOTE) The Xpert Xpress SARS-CoV-2/FLU/RSV plus assay is intended as an aid in the diagnosis of influenza from Nasopharyngeal swab specimens and should not be used as a sole basis for treatment. Nasal washings and aspirates are unacceptable for Xpert Xpress SARS-CoV-2/FLU/RSV testing.  Fact Sheet for Patients: bloggercourse.com  Fact Sheet for Healthcare Providers: seriousbroker.it  This test is not yet approved or cleared by the [place]  FDA and has been authorized for detection and/or diagnosis of SARS-CoV-2 by FDA under an Emergency Use Authorization (EUA). This EUA will remain in effect (meaning this test can be used) for the duration of the COVID-19 declaration under Section 564(b)(1) of the Act, 21 U.S.C. section 360bbb-3(b)(1), unless the authorization is terminated or revoked.     Resp Syncytial Virus by PCR NEGATIVE NEGATIVE Final    Comment: (NOTE) Fact Sheet for Patients: bloggercourse.com  Fact Sheet for Healthcare Providers: seriousbroker.it  This test is not yet approved or cleared by the [place]  FDA and has been  authorized for detection and/or diagnosis of SARS-CoV-2 by FDA under an Emergency Use Authorization (EUA). This EUA will remain in effect (meaning this test can be used) for the duration of the COVID-19 declaration under Section 564(b)(1) of the Act, 21 U.S.C. section 360bbb-3(b)(1), unless the authorization is terminated or revoked.  Performed at [facility], [address] [place], [place] [address]   Blood culture (routine x 2)     Status: None (Preliminary result)   Collection Time: 08/06/24  6:13 PM   Specimen: BLOOD  Result Value Ref Range Status   Specimen Description BLOOD RIGHT ANTECUBITAL  Final   Special Requests   Final    BOTTLES DRAWN AEROBIC AND ANAEROBIC Blood Culture results may not be optimal due to an inadequate volume of blood received in  culture bottles   Culture   Final    NO GROWTH 4 DAYS Performed at [facility], [address], [place], [place] [address]    Report Status PENDING  Incomplete  Blood culture (routine x 2)     Status: None (Preliminary result)   Collection Time: 08/06/24  8:49 PM   Specimen: BLOOD LEFT ARM  Result Value Ref Range Status   Specimen Description BLOOD LEFT ARM  Final   Special Requests   Final    BOTTLES DRAWN AEROBIC AND ANAEROBIC Blood Culture adequate volume   Culture   Final    NO GROWTH 4 DAYS Performed at [facility], [address], [place], [place] [address]    Report Status PENDING  Incomplete         Radiology Studies: DG Chest Port 1 View Result Date: 08/09/2024 EXAM: 1 VIEW(S) XRAY OF THE CHEST 08/09/2024 01:38:00 PM COMPARISON: 08/06/2024 CLINICAL HISTORY: Dyspnea. FINDINGS: LUNGS AND PLEURA: Persistent bibasilar atelectasis, left greater than right. No significant pleural effusion. No interstitial edema. No consolidative change. No pneumothorax. HEART AND MEDIASTINUM: Aortic atherosclerotic calcification. No acute abnormality of the cardiac and mediastinal silhouettes. BONES AND SOFT  TISSUES: Remote healed midshaft of left clavicle fracture. No acute osseous abnormality. IMPRESSION: 1. Persistent bibasilar atelectasis, left greater than right. Electronically signed by: Waddell Calk MD 08/09/2024 01:40 PM EST RP Workstation: [id number]         Scheduled Meds:  aspirin  EC  81 mg Oral Daily   atorvastatin   80 mg Oral Daily   budesonide -glycopyrrolate -formoterol   2 puff Inhalation BID   busPIRone   30 mg Oral q AM   And   busPIRone   15 mg Oral Q1200   And   busPIRone   15 mg Oral QPM   doxepin   75 mg Oral QHS   doxycycline   100 mg Oral Q12H   enoxaparin  (LOVENOX ) injection  40 mg Subcutaneous Q24H   escitalopram   20 mg Oral Daily   gabapentin   600 mg Oral BID   insulin  aspart  0-5 Units Subcutaneous QHS   insulin  aspart  0-6 Units Subcutaneous TID WC   insulin  glargine  5 Units Subcutaneous Daily   ipratropium-albuterol   3 mL Nebulization TID   levothyroxine   112 mcg Oral Daily   methylPREDNISolone  (SOLU-MEDROL ) injection  125 mg Intravenous Q24H   mirtazapine   45 mg Oral QHS   montelukast   10 mg Oral QHS   nebivolol   10 mg Oral Daily   pantoprazole   40 mg Oral BID AC   sodium chloride  flush  3 mL Intravenous Q12H   ticagrelor   60 mg Oral BID   Continuous Infusions:  cefTRIAXone  (ROCEPHIN )  IV 2 g (08/09/24 2345)     LOS: 3 days      Brayton Lye, MD [facility]   To contact the attending provider between 7A-7P or the covering provider during after hours 7P-7A, please log into the web site [email or web address] and access using universal [place] password for that web site. If you do not have the password, please call the hospital operator.  08/10/2024, 1:59 PM   "

## 2024-08-11 ENCOUNTER — Ambulatory Visit: Admitting: Pulmonary Disease

## 2024-08-11 ENCOUNTER — Encounter

## 2024-08-11 ENCOUNTER — Encounter (HOSPITAL_BASED_OUTPATIENT_CLINIC_OR_DEPARTMENT_OTHER)

## 2024-08-11 ENCOUNTER — Other Ambulatory Visit (HOSPITAL_COMMUNITY): Payer: Self-pay

## 2024-08-11 LAB — BASIC METABOLIC PANEL WITH GFR
Anion gap: 7 (ref 5–15)
BUN: 28 mg/dL — ABNORMAL HIGH (ref 8–23)
CO2: 35 mmol/L — ABNORMAL HIGH (ref 22–32)
Calcium: 9.5 mg/dL (ref 8.9–10.3)
Chloride: 99 mmol/L (ref 98–111)
Creatinine, Ser: 0.84 mg/dL (ref 0.44–1.00)
GFR, Estimated: >60 mL/min
Glucose, Bld: 159 mg/dL — ABNORMAL HIGH (ref 70–99)
Potassium: 4.1 mmol/L (ref 3.5–5.1)
Sodium: 140 mmol/L (ref 135–145)

## 2024-08-11 LAB — CBC
HCT: 33 % — ABNORMAL LOW (ref 36.0–46.0)
Hemoglobin: 10.4 g/dL — ABNORMAL LOW (ref 12.0–15.0)
MCH: 29 pg (ref 26.0–34.0)
MCHC: 31.5 g/dL (ref 30.0–36.0)
MCV: 91.9 fL (ref 80.0–100.0)
Platelets: 502 10*3/uL — ABNORMAL HIGH (ref 150–400)
RBC: 3.59 MIL/uL — ABNORMAL LOW (ref 3.87–5.11)
RDW: 15.7 % — ABNORMAL HIGH (ref 11.5–15.5)
WBC: 20.7 10*3/uL — ABNORMAL HIGH (ref 4.0–10.5)
nRBC: 0.1 % (ref 0.0–0.2)

## 2024-08-11 LAB — GLUCOSE, CAPILLARY
Glucose-Capillary: 111 mg/dL — ABNORMAL HIGH (ref 70–99)
Glucose-Capillary: 443 mg/dL — ABNORMAL HIGH (ref 70–99)
Glucose-Capillary: 222 mg/dL — ABNORMAL HIGH (ref 70–99)
Glucose-Capillary: 240 mg/dL — ABNORMAL HIGH (ref 70–99)

## 2024-08-11 LAB — CULTURE, BLOOD (ROUTINE X 2)
Culture: NO GROWTH
Culture: NO GROWTH
Special Requests: ADEQUATE

## 2024-08-11 MED ORDER — INSULIN ASPART 100 UNIT/ML IJ SOLN
0.0000 [IU] | Freq: Three times a day (TID) | INTRAMUSCULAR | Status: DC
  Administered 2024-08-11 – 2024-08-12 (×2): 5 [IU] via SUBCUTANEOUS
  Filled 2024-08-11: qty 1
  Filled 2024-08-11: qty 2

## 2024-08-11 MED ORDER — INSULIN ASPART 100 UNIT/ML IJ SOLN
20.0000 [IU] | Freq: Once | INTRAMUSCULAR | Status: AC
  Administered 2024-08-11: 20 [IU] via SUBCUTANEOUS
  Filled 2024-08-11: qty 1

## 2024-08-11 MED ORDER — PREDNISONE 20 MG PO TABS
40.0000 mg | ORAL_TABLET | Freq: Every day | ORAL | Status: DC
  Administered 2024-08-11 – 2024-08-12 (×2): 40 mg via ORAL
  Filled 2024-08-11 (×2): qty 2

## 2024-08-11 MED ORDER — INSULIN GLARGINE-YFGN 100 UNIT/ML ~~LOC~~ SOLN
5.0000 [IU] | Freq: Once | SUBCUTANEOUS | Status: AC
  Administered 2024-08-11: 5 [IU] via SUBCUTANEOUS
  Filled 2024-08-11: qty 0.05

## 2024-08-11 NOTE — Progress Notes (Signed)
 Physical Therapy Treatment Patient Details Name: [name] [name] MRN: [id number] DOB: [birth year] Today's Date: 08/11/2024   History of Present Illness Pt is a 69 y.o. F presenting to [facility] on 08/06/24 with SOB and cough. Pt admitted with PNA and COPD exacerbation. PMH is significant for HTN, CAD , DM2, COPD on 4L O2, hypothyroidism, RTC repair 12/24/2021, ongoing tobacco abuse. Recent admission 12/4 - 12/9 d/t acute on chronic hypoxic respiratory failure    PT Comments  Pt received in supine and agreeable to session. Pt demonstrates improved stability and activity tolerance this session. Pt benefits from rollator during gait trial and pt reports having one at home. Education provided on energy conservation and SpO2 management with pt verbalizing understanding. Pt limited by DOE, but is able to maintain SpO2 WFL on 2L. Pt continues to benefit from PT services to progress toward functional mobility goals.     If plan is discharge home, recommend the following: A little help with walking and/or transfers;A little help with bathing/dressing/bathroom;Assistance with cooking/housework;Assist for transportation;Help with stairs or ramp for entrance   Can travel by private vehicle        Equipment Recommendations  None recommended by PT    Recommendations for Other Services       Precautions / Restrictions Precautions Precautions: Fall Recall of Precautions/Restrictions: Intact Restrictions Weight Bearing Restrictions Per Provider Order: No     Mobility  Bed Mobility Overal bed mobility: Modified Independent                  Transfers Overall transfer level: Needs assistance Equipment used: None Transfers: Sit to/from Stand Sit to Stand: Supervision                Ambulation/Gait Ambulation/Gait assistance: Supervision Gait Distance (Feet): 170 Feet Assistive device: Rollator (4 wheels) Gait Pattern/deviations: Step-through pattern, Trunk flexed, Decreased stride  length Gait velocity: reduced     General Gait Details: slow, steady gait with light support on rollator. 1 standing rest break for Spo2 monitoring   Stairs             Wheelchair Mobility     Tilt Bed    Modified Rankin (Stroke Patients Only)       Balance Overall balance assessment: Needs assistance Sitting-balance support: No upper extremity supported, Feet supported Sitting balance-Leahy Scale: Normal     Standing balance support: No upper extremity supported, During functional activity, Bilateral upper extremity supported Standing balance-Leahy Scale: Fair Standing balance comment: benefits from UE support                            Communication Communication Communication: No apparent difficulties  Cognition Arousal: Alert Behavior During Therapy: WFL for tasks assessed/performed   PT - Cognitive impairments: No apparent impairments                         Following commands: Intact Following commands impaired: Follows multi-step commands inconsistently, Follows one step commands with increased time    Cueing Cueing Techniques: Verbal cues, Gestural cues  Exercises      General Comments General comments (skin integrity, edema, etc.): Pt on 2L throughout session with SpO2 90-96%      Pertinent Vitals/Pain Pain Assessment Pain Assessment: No/denies pain     PT Goals (current goals can now be found in the care plan section) Acute Rehab PT Goals Patient Stated Goal: to be able to stay out  of the hospital and get stronger PT Goal Formulation: With patient/family Time For Goal Achievement: 08/21/24 Progress towards PT goals: Progressing toward goals    Frequency    Min 1X/week       AM-PAC PT 6 Clicks Mobility   Outcome Measure  Help needed turning from your back to your side while in a flat bed without using bedrails?: None Help needed moving from lying on your back to sitting on the side of a flat bed without  using bedrails?: None Help needed moving to and from a bed to a chair (including a wheelchair)?: A Little Help needed standing up from a chair using your arms (e.g., wheelchair or bedside chair)?: A Little Help needed to walk in hospital room?: A Little Help needed climbing 3-5 steps with a railing? : A Little 6 Click Score: 20    End of Session Equipment Utilized During Treatment: Gait belt;Oxygen  Activity Tolerance: Patient tolerated treatment well Patient left: with call bell/phone within reach;in chair Nurse Communication: Mobility status PT Visit Diagnosis: Unsteadiness on feet (R26.81);Other abnormalities of gait and mobility (R26.89);Muscle weakness (generalized) (M62.81)     Time: 8884-8864 PT Time Calculation (min) (ACUTE ONLY): 20 min  Charges:    $Gait Training: 8-22 mins PT General Charges $$ ACUTE PT VISIT: 1 Visit                    Darryle George, PTA Acute Rehabilitation Services Secure Chat Preferred  [phone]    Darryle George 08/11/2024, 11:45 AM

## 2024-08-11 NOTE — Care Management Important Message (Signed)
 Important Message  Patient Details  Name: [name] [name] MRN: [id number] Date of Birth: 1956-02-23   Important Message Given:  Yes - Medicare IM  IM given on 07/10/2025   Claretta Deed 08/11/2024, 10:17 AM

## 2024-08-12 ENCOUNTER — Ambulatory Visit: Admitting: Student in an Organized Health Care Education/Training Program

## 2024-08-12 ENCOUNTER — Other Ambulatory Visit (HOSPITAL_COMMUNITY): Payer: Self-pay

## 2024-08-12 ENCOUNTER — Ambulatory Visit: Payer: Self-pay | Admitting: *Deleted

## 2024-08-12 LAB — GLUCOSE, CAPILLARY: Glucose-Capillary: 204 mg/dL — ABNORMAL HIGH (ref 70–99)

## 2024-08-12 MED ORDER — TRELEGY ELLIPTA 200-62.5-25 MCG/ACT IN AEPB
1.0000 | INHALATION_SPRAY | Freq: Every day | RESPIRATORY_TRACT | 1 refills | Status: AC
  Filled 2024-08-12: qty 60, 30d supply, fill #0

## 2024-08-12 MED ORDER — AMOXICILLIN-POT CLAVULANATE 875-125 MG PO TABS
1.0000 | ORAL_TABLET | Freq: Two times a day (BID) | ORAL | 0 refills | Status: AC
  Filled 2024-08-12: qty 4, 2d supply, fill #0

## 2024-08-12 MED ORDER — ALBUTEROL SULFATE HFA 108 (90 BASE) MCG/ACT IN AERS
2.0000 | INHALATION_SPRAY | RESPIRATORY_TRACT | 1 refills | Status: AC | PRN
  Filled 2024-08-12: qty 20.1, 50d supply, fill #0

## 2024-08-12 MED ORDER — PREDNISONE 10 MG PO TABS
ORAL_TABLET | ORAL | 0 refills | Status: AC
  Filled 2024-08-12: qty 10, 4d supply, fill #0

## 2024-08-12 NOTE — Discharge Summary (Signed)
 "  PATIENT DETAILS Name: [name] [name] Age: 69 y.o. Sex: female Date of Birth: 1956-01-12 MRN: [id number]. Admitting Physician: Evalene GORMAN Sprinkles, MD ERE:Gnywdnw, Barnie [name], MD  Admit Date: 08/06/2024 Discharge date: 08/12/2024  Recommendations for Outpatient Follow-up:  Follow up with PCP in 1-2 weeks Please obtain CMP/CBC in one week   Admitted From:  Home  Disposition: Home health   Discharge Condition: good  CODE STATUS:   Code Status: Full Code   Diet recommendation:  Diet Order             Diet renal/carb modified with fluid restriction           Diet regular Room service appropriate? Yes; Fluid consistency: Thin  Diet effective now                    Brief Summary: Patient is a 69 y.o.  female with history of asthma/COPD overlap, chronic hypoxic respiratory failure on 3 L at baseline, chronic HFpEF, CAD, HLD, DM-2-who presented with shortness of breath-found to have COPD exacerbation and PNA.   Significant events 1/23>> admit to [facility]   Significant studies: 1/23>> CTA chest: No PE.  Peribronchial infiltrates with mucous plugging or debris in the lower lobe-possibly representing multifocal PNA or aspiration   Significant microbiology data: 1/23>> COVID/influenza/RSV PCR: Negative 1/23>> blood culture: No growth   Procedures: None   Consults: None  Brief Hospital Course: Sepsis secondary to PNA Sepsis physiology improved Worsening leukocytosis likely secondary to steroids All cultures negative so far Overall clinically improved-treated with Rocephin /doxycycline  during this hospitalization-Will transition to Augmentin  for a few more days.   COPD exacerbation Much better-treated with steroids/bronchodilators-antibiotics as above Feels much better-back to baseline-wants to go home Lungs are relatively clear this morning with only a few scattered rhonchi.   Chronic hypoxic respiratory failure on 3 L of oxygen  at baseline Supportive care-see above    Chronic HFpEF Euvolemic Continue diuretics-try to maintain negative balance.   CAD/with multiple PCI-most recent LHC in 2018 No anginal symptoms Continue aspirin /Brilinta /beta-blocker/Lipitor    HTN BP stable Bystolic    DM-2 (A1c 7.6 on 1/27) with uncontrolled hyperglycemia secondary to steroids. CBGs have stabilized Will be in a rapid steroid taper Will be placed back on her oral hypoglycemic regimen on discharge.  Hypothyroidism Synthroid     GERD PPI   Mood disorder Stable BuSpar /doxepin /Remeron  As needed Klonopin    Class 1 Obesity: Estimated body mass index is 31.62 kg/m as calculated from the following:   Height as of this encounter: 5' 1 (1.549 m).   Weight as of this encounter: 75.9 kg.    Discharge Diagnoses:  Principal Problem:   COPD exacerbation (HCC) Active Problems:   Acute on chronic respiratory failure with hypoxia and hypercapnia (HCC)   (HFpEF) heart failure with preserved ejection fraction (HCC)   Acquired hypothyroidism   Essential hypertension   Non-insulin  dependent type 2 diabetes mellitus (HCC)   Anxiety and depression   Pneumonia   Hypokalemia   Discharge Instructions:  Activity:  As tolerated   Discharge Instructions     Call MD for:  difficulty breathing, headache or visual disturbances   Complete by: As directed    Diet renal/carb modified with fluid restriction   Complete by: As directed    Increase activity slowly   Complete by: As directed       Allergies as of 08/12/2024       Reactions   Avocado Anaphylaxis   Latex Shortness Of Breath, Rash  Codeine  Nausea Only        Medication List     TAKE these medications    acetaminophen  500 MG tablet Commonly known as: TYLENOL  Take 1 tablet (500 mg total) by mouth every 6 (six) hours as needed for mild pain (pain score 1-3), fever or headache.   albuterol  108 (90 Base) MCG/ACT inhaler Commonly known as: Ventolin  HFA Inhale 2 puffs into the lungs every 4  (four) hours as needed for wheezing or shortness of breath. What changed: See the new instructions.   alendronate  70 MG tablet Commonly known as: FOSAMAX  Take 1 tablet (70 mg total) by mouth every 7 (seven) days. Take with a full glass of water on an empty stomach.   amoxicillin -clavulanate 875-125 MG tablet Commonly known as: AUGMENTIN  Take 1 tablet by mouth 2 (two) times daily for 2 days.   aspirin  EC 81 MG tablet Take 81 mg by mouth in the morning.   atorvastatin  80 MG tablet Commonly known as: LIPITOR  Take 1 tablet (80 mg total) by mouth daily.   busPIRone  15 MG tablet Commonly known as: BUSPAR  Take 2 tablets (30 mg total) by mouth in the morning AND 1 tablet (15 mg total) daily at 12 noon AND 1 tablet (15 mg total) every evening. What changed: See the new instructions.   clonazePAM  0.5 MG tablet Commonly known as: KlonoPIN  Take 1 tablet (0.5 mg total) by mouth 2 (two) times daily as needed.   doxepin  75 MG capsule Commonly known as: SINEQUAN  Take 1 capsule (75 mg total) by mouth at bedtime.   Dupixent  300 MG/2ML Soaj Generic drug: Dupilumab  Inject 300 mg into the skin every 14 (fourteen) days.   escitalopram  20 MG tablet Commonly known as: LEXAPRO  Take 1 tablet (20 mg total) by mouth daily.   ezetimibe  10 MG tablet Commonly known as: ZETIA  Take 1 tablet (10 mg total) by mouth daily.   gabapentin  600 MG tablet Commonly known as: NEURONTIN  Take 1 tablet (600 mg total) by mouth 2 (two) times daily.   guaiFENesin -dextromethorphan  100-10 MG/5ML syrup Commonly known as: ROBITUSSIN DM Take 15 mLs by mouth every 4 (four) hours as needed for cough.   hydrOXYzine  25 MG tablet Commonly known as: ATARAX  Take 1 tablet (25 mg total) by mouth at bedtime as needed.   ipratropium-albuterol  0.5-2.5 (3) MG/3ML Soln Commonly known as: DUONEB Take 3 mLs by nebulization every 8 (eight) hours.   Jardiance  10 MG Tabs tablet Generic drug: empagliflozin  Take 1 tablet (10 mg  total) by mouth daily before breakfast.   levothyroxine  112 MCG tablet Commonly known as: SYNTHROID  Take 1 tablet (112 mcg total) by mouth daily.   metFORMIN  500 MG tablet Commonly known as: GLUCOPHAGE  Take 1 tablet (500 mg total) by mouth 2 (two) times daily with a meal. What changed: when to take this   mirtazapine  45 MG tablet Commonly known as: REMERON  Take 1 tablet (45 mg total) by mouth at bedtime.   montelukast  10 MG tablet Commonly known as: SINGULAIR  Take 1 tablet (10 mg total) by mouth at bedtime.   nebivolol  10 MG tablet Commonly known as: Bystolic  Take 1 tablet (10 mg total) by mouth daily.   nitroGLYCERIN  0.4 MG SL tablet Commonly known as: NITROSTAT  Place 1 tablet (0.4 mg total) under the tongue every 5 (five) minutes as needed for chest pain.   Ohtuvayre  3 MG/2.5ML Susp Generic drug: Ensifentrine  Take 1 vial by nebulization in the morning and at bedtime.   omeprazole  40 MG capsule  Commonly known as: PRILOSEC Take 1 capsule (40 mg total) by mouth 2 (two) times daily before a meal. NEED APPOINTMENT FOR ADDITIONAL REFILLS   OXYGEN  Inhale 4 L/min into the lungs continuous.   predniSONE  10 MG tablet Commonly known as: DELTASONE  Take 40 mg daily for 1 day, 30 mg daily for 1 day, 20 mg daily for 1 days,10 mg daily for 1 day, then stop What changed:  how much to take how to take this when to take this additional instructions   ticagrelor  60 MG Tabs tablet Commonly known as: Brilinta  Take 1 tablet (60 mg total) by mouth 2 (two) times daily.   torsemide  20 MG tablet Commonly known as: DEMADEX  Take 1-2 tablets (20-40 mg total) by mouth daily. Take 1 or 2 tablets, depending on lower extremity edema and shortness of breath   Trelegy Ellipta  200-62.5-25 MCG/ACT Aepb Generic drug: Fluticasone -Umeclidin-Vilant Inhale 1 puff into the lungs daily.   varenicline  1 MG tablet Commonly known as: CHANTIX  Take 1 tablet (1 mg total) by mouth 2 (two) times daily.    Vitamin D  (Ergocalciferol ) 1.25 MG (50000 UNIT) Caps capsule Commonly known as: DRISDOL  Take 1 capsule (50,000 Units total) by mouth every 7 (seven) days.        Contact information for follow-up providers     Vicci Barnie NOVAK, MD. Schedule an appointment as soon as possible for a visit in 1 week(s).   Specialty: Internal Medicine Contact information: [address] [place] [address] [phone]              Contact information for after-discharge care     Home Medical Care     [facility] [place] [facility]) .   Service: Home Health Services Contact information: [address] [place]  [address] [phone]                    Allergies[1]   Other Procedures/Studies: DG Chest Port 1 View Result Date: 08/09/2024 EXAM: 1 VIEW(S) XRAY OF THE CHEST 08/09/2024 01:38:00 PM COMPARISON: 08/06/2024 CLINICAL HISTORY: Dyspnea. FINDINGS: LUNGS AND PLEURA: Persistent bibasilar atelectasis, left greater than right. No significant pleural effusion. No interstitial edema. No consolidative change. No pneumothorax. HEART AND MEDIASTINUM: Aortic atherosclerotic calcification. No acute abnormality of the cardiac and mediastinal silhouettes. BONES AND SOFT TISSUES: Remote healed midshaft of left clavicle fracture. No acute osseous abnormality. IMPRESSION: 1. Persistent bibasilar atelectasis, left greater than right. Electronically signed by: Waddell Calk MD 08/09/2024 01:40 PM EST RP Workstation: [id number]   CT Angio Chest PE W and/or Wo Contrast Result Date: 08/06/2024 EXAM: CTA of the Chest with contrast for PE 08/06/2024 10:38:53 PM TECHNIQUE: CTA of the chest was performed without and with the administration of 75 mL of intravenous contrast (iohexol  (OMNIPAQUE ) 350 MG/ML injection). Multiplanar reformatted images are provided for review. MIP images are provided for review. Automated exposure control, iterative  reconstruction, and/or weight based adjustment of the mA/kV was utilized to reduce the radiation dose to as low as reasonably achievable. COMPARISON: Chest radiograph 08/06/2024 and CT chest abdomen and pelvis 11/12/2023. CLINICAL HISTORY: Pulmonary embolism (PE) suspected, low to intermediate prob, neg D-dimer. Pulmonary embolism suspected, low to intermediate probability, negative D-dimer. FINDINGS: PULMONARY ARTERIES: Good opacification of the central and segmental pulmonary arteries. No focal filling defects. No evidence of significant pulmonary embolus. Main pulmonary artery is normal in caliber. MEDIASTINUM: Normal heart size. No pericardial effusions. Calcification of the aorta and coronary arteries. No  aortic aneurysm or dissection. Great vessel origins are patent. The thyroid  gland is atrophic. The esophagus is decompressed. Debris in the nondilated esophagus may represent evidence of dysmotility. LYMPH NODES: No significant lymphadenopathy. LUNGS AND PLEURA: Emphysematous changes in the lungs. Peribronchial infiltrates in the posterior lungs with mucous plugging or debris in the lower lobe bronchi. Mild bronchiectasis and bronchial wall thickening. Changes may represent multifocal pneumonia or aspiration. No pleural effusion or pneumothorax. UPPER ABDOMEN: No acute abnormalities demonstrated in the visualized upper abdomen. Left adrenal gland nodule measuring 1.3 cm diameter. Negative 0.77 Hounsfield unit suggests benign adenoma. No imaging follow-up is indicated. No change since the prior study. SOFT TISSUES AND BONES: Degenerative changes in the spine. Multiple old right rib fractures. No acute bony abnormalities. Mild motion artifact. IMPRESSION: 1. No evidence of pulmonary embolism. 2. Peribronchial infiltrates in the posterior lungs with mucous plugging or debris in the lower lobe bronchi, mild bronchiectasis, and bronchial wall thickening, possibly representing multifocal pneumonia or aspiration. 3.  Pulmonary emphysema is an independent risk factor for lung cancer. Recommend consideration for evaluation for a low-dose CT lung cancer screening program. 4. 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 No imaging follow-up is indicated. Electronically signed by: Elsie Gravely MD 08/06/2024 10:48 PM EST RP Workstation: [id number]   DG Chest Portable 1 View Result Date: 08/06/2024 EXAM: 1 VIEW(S) XRAY OF THE CHEST 08/06/2024 05:56:00 PM COMPARISON: 07/18/2024 CLINICAL HISTORY: Shortness of breath. FINDINGS: LUNGS AND PLEURA: Low lung volumes. Streaky bibasilar opacities. No pleural effusion. No pneumothorax. HEART AND MEDIASTINUM: Aortic atherosclerosis. The cardiac silhouette is unremarkable. BONES AND SOFT TISSUES: Chronic left clavicle fracture. IMPRESSION: 1. Mild bibasilar atelectasis, new from the prior exam. Electronically signed by: Oneil Devonshire MD 08/06/2024 06:16 PM EST RP Workstation: [id number]   DG Chest Port 1 View Result Date: 07/18/2024 EXAM: 1 VIEW(S) XRAY OF THE CHEST 07/18/2024 10:53:39 PM COMPARISON: 07/16/2024 CLINICAL HISTORY: SOB, Cough. FINDINGS: LUNGS AND PLEURA: No focal pulmonary opacity. No pleural effusion. No pneumothorax. HEART AND MEDIASTINUM: No acute abnormality of the cardiac and mediastinal silhouettes. BONES AND SOFT TISSUES: No acute osseous abnormality. IMPRESSION: 1. No active cardiopulmonary disease. Electronically signed by: Franky Crease MD 07/18/2024 10:55 PM EST RP Workstation: [id number]   DG Chest 2 View Result Date: 07/16/2024 CLINICAL DATA:  Cough, shortness of breath EXAM: CHEST - 2 VIEW COMPARISON:  July 02, 2024 FINDINGS: The heart size and mediastinal contours are within normal limits. Stable minimal left basilar scarring is noted. No acute pulmonary disease is noted. Old left clavicular fracture. Old right rib fractures. IMPRESSION: No active cardiopulmonary disease. Electronically Signed   By: Lynwood Landy Raddle M.D.   On: 07/16/2024 18:24      TODAY-DAY OF DISCHARGE:  Subjective:   Cay Kath today has no headache,no chest abdominal pain,no new weakness tingling or numbness, feels much better wants to go home today.   Objective:   Blood pressure (!) 160/68, pulse 77, temperature 98.3 F (36.8 C), temperature source Oral, resp. rate 16, height 5' 1 (1.549 m), weight 75.9 kg, SpO2 100%.  Intake/Output Summary (Last 24 hours) at 08/12/2024 0816 Last data filed at 08/11/2024 2008 Gross per 24 hour  Intake 3 ml  Output --  Net 3 ml   Filed Weights   08/06/24 2256  Weight: 75.9 kg    Exam: Awake Alert, Oriented *3, No new F.N deficits, Normal affect [place].AT,PERRAL Supple Neck,No JVD, No cervical lymphadenopathy appriciated.  Symmetrical Chest wall movement, Good air movement bilaterally, CTAB RRR,No  Gallops,Rubs or new Murmurs, No Parasternal Heave +ve B.Sounds, Abd Soft, Non tender, No organomegaly appriciated, No rebound -guarding or rigidity. No Cyanosis, Clubbing or edema, No new Rash or bruise   PERTINENT RADIOLOGIC STUDIES: No results found.   PERTINENT LAB RESULTS: CBC: Recent Labs    08/10/24 0402 08/11/24 0359  WBC 18.3* 20.7*  HGB 9.7* 10.4*  HCT 31.0* 33.0*  PLT 497* 502*   CMET CMP     Component Value Date/Time   NA 140 08/11/2024 0359   NA 142 07/02/2024 1512   K 4.1 08/11/2024 0359   CL 99 08/11/2024 0359   CO2 35 (H) 08/11/2024 0359   GLUCOSE 159 (H) 08/11/2024 0359   BUN 28 (H) 08/11/2024 0359   BUN 18 07/02/2024 1512   CREATININE 0.84 08/11/2024 0359   CREATININE 0.81 04/28/2024 1344   CREATININE 0.93 04/16/2016 0937   CALCIUM  9.5 08/11/2024 0359   PROT 6.9 08/06/2024 1720   PROT 6.1 12/26/2023 1633   ALBUMIN 4.0 08/06/2024 1720   ALBUMIN 4.3 12/26/2023 1633   AST 18 08/06/2024 1720   AST 13 (L) 04/28/2024 1344   ALT 36 08/06/2024 1720   ALT 13 04/28/2024 1344   ALKPHOS 86 08/06/2024 1720   BILITOT 0.4 08/06/2024 1720   BILITOT 0.2 04/28/2024 1344   GFR 70.52  08/30/2020 1404   EGFR 79 07/02/2024 1512   GFRNONAA >60 08/11/2024 0359   GFRNONAA >60 04/28/2024 1344   GFRNONAA 67 04/16/2016 0937    GFR Estimated Creatinine Clearance: 59.7 mL/min (by C-G formula based on SCr of 0.84 mg/dL). No results for input(s): LIPASE, AMYLASE in the last 72 hours. No results for input(s): CKTOTAL, CKMB, CKMBINDEX, TROPONINI in the last 72 hours. Invalid input(s): POCBNP No results for input(s): DDIMER in the last 72 hours. Recent Labs    08/10/24 0719  HGBA1C 7.6*   No results for input(s): CHOL, HDL, LDLCALC, TRIG, CHOLHDL, LDLDIRECT in the last 72 hours. No results for input(s): TSH, T4TOTAL, T3FREE, THYROIDAB in the last 72 hours.  Invalid input(s): FREET3 No results for input(s): VITAMINB12, FOLATE, FERRITIN, TIBC, IRON , RETICCTPCT in the last 72 hours. Coags: No results for input(s): INR in the last 72 hours.  Invalid input(s): PT Microbiology: Recent Results (from the past 240 hours)  Resp panel by RT-PCR (RSV, Flu A&B, Covid) Anterior Nasal Swab     Status: None   Collection Time: 08/06/24  5:21 PM   Specimen: Anterior Nasal Swab  Result Value Ref Range Status   SARS Coronavirus 2 by RT PCR NEGATIVE NEGATIVE Final   Influenza A by PCR NEGATIVE NEGATIVE Final   Influenza B by PCR NEGATIVE NEGATIVE Final    Comment: (NOTE) The Xpert Xpress SARS-CoV-2/FLU/RSV plus assay is intended as an aid in the diagnosis of influenza from Nasopharyngeal swab specimens and should not be used as a sole basis for treatment. Nasal washings and aspirates are unacceptable for Xpert Xpress SARS-CoV-2/FLU/RSV testing.  Fact Sheet for Patients: bloggercourse.com  Fact Sheet for Healthcare Providers: seriousbroker.it  This test is not yet approved or cleared by the [place]  FDA and has been authorized for detection and/or diagnosis of SARS-CoV-2  by FDA under an Emergency Use Authorization (EUA). This EUA will remain in effect (meaning this test can be used) for the duration of the COVID-19 declaration under Section 564(b)(1) of the Act, 21 U.S.C. section 360bbb-3(b)(1), unless the authorization is terminated or revoked.     Resp Syncytial Virus by PCR NEGATIVE NEGATIVE Final  Comment: (NOTE) Fact Sheet for Patients: bloggercourse.com  Fact Sheet for Healthcare Providers: seriousbroker.it  This test is not yet approved or cleared by the [place]  FDA and has been authorized for detection and/or diagnosis of SARS-CoV-2 by FDA under an Emergency Use Authorization (EUA). This EUA will remain in effect (meaning this test can be used) for the duration of the COVID-19 declaration under Section 564(b)(1) of the Act, 21 U.S.C. section 360bbb-3(b)(1), unless the authorization is terminated or revoked.  Performed at [facility], [address], [place], [place] [address]   Blood culture (routine x 2)     Status: None   Collection Time: 08/06/24  6:13 PM   Specimen: BLOOD  Result Value Ref Range Status   Specimen Description BLOOD RIGHT ANTECUBITAL  Final   Special Requests   Final    BOTTLES DRAWN AEROBIC AND ANAEROBIC Blood Culture results may not be optimal due to an inadequate volume of blood received in culture bottles   Culture   Final    NO GROWTH 5 DAYS Performed at [facility], [address], [place], [place] [address]    Report Status 08/11/2024 FINAL  Final  Blood culture (routine x 2)     Status: None   Collection Time: 08/06/24  8:49 PM   Specimen: BLOOD LEFT ARM  Result Value Ref Range Status   Specimen Description BLOOD LEFT ARM  Final   Special Requests   Final    BOTTLES DRAWN AEROBIC AND ANAEROBIC Blood Culture adequate volume   Culture   Final    NO GROWTH 5 DAYS Performed at [facility], [address] [place], [place]  [address]    Report Status 08/11/2024 FINAL  Final    FURTHER DISCHARGE INSTRUCTIONS:  Get Medicines reviewed and adjusted: Please take all your medications with you for your next visit with your Primary MD  Laboratory/radiological data: Please request your Primary MD to go over all hospital tests and procedure/radiological results at the follow up, please ask your Primary MD to get all Hospital records sent to his/her office.  In some cases, they will be blood work, cultures and biopsy results pending at the time of your discharge. Please request that your primary care M.D. goes through all the records of your hospital data and follows up on these results.  Also Note the following: If you experience worsening of your admission symptoms, develop shortness of breath, life threatening emergency, suicidal or homicidal thoughts you must seek medical attention immediately by calling 911 or calling your MD immediately  if symptoms less severe.  You must read complete instructions/literature along with all the possible adverse reactions/side effects for all the Medicines you take and that have been prescribed to you. Take any new Medicines after you have completely understood and accpet all the possible adverse reactions/side effects.   Do not drive when taking Pain medications or sleeping medications (Benzodaizepines)  Do not take more than prescribed Pain, Sleep and Anxiety Medications. It is not advisable to combine anxiety,sleep and pain medications without talking with your primary care practitioner  Special Instructions: If you have smoked or chewed Tobacco  in the last 2 yrs please stop smoking, stop any regular Alcohol  and or any Recreational drug use.  Wear Seat belts while driving.  Please note: You were cared for by a hospitalist during your hospital stay. Once you are discharged, your primary care physician will handle any further medical issues. Please note that  NO REFILLS for any  discharge medications will be authorized once you are discharged, as it is imperative that you return to your primary care physician (or establish a relationship with a primary care physician if you do not have one) for your post hospital discharge needs so that they can reassess your need for medications and monitor your lab values.  Total Time spent coordinating discharge including counseling, education and face to face time equals greater than 30 minutes.  Signed: Francenia Chimenti 08/12/2024 8:16 AM      [1]  Allergies Allergen Reactions   Avocado Anaphylaxis   Latex Shortness Of Breath and Rash   Codeine  Nausea Only   "

## 2024-08-12 NOTE — TOC Transition Note (Signed)
 Transition of Care [facility]) - Discharge Note   Patient Details  Name: [name] [name] MRN: [id number] Date of Birth: 03-14-1956  Transition of [facility]) CM/SW Contact:  [name], RN Phone Number: 08/12/2024, 8:46 AM   Clinical Narrative:     Patient will be discharging to home today, with family providing transportation. Patient did confirm daughter will bring O2 POC for transport home.   HHPT arranged through [facility], info on AVS.   No further needs identified by CM.  Final next level of care: Home w Home Health Services Barriers to Discharge: No Barriers Identified   Patient Goals and CMS Choice   CMS Medicare.gov Compare Post Acute Care list provided to:: Patient Choice offered to / list presented to : Patient      Discharge Placement                       Discharge Plan and Services Additional resources added to the After Visit Summary for                            [facility] Arranged: PT [facility] Agency: [facility] Date [facility] Agency Contacted: 08/10/24 Time HH Agency Contacted: [phone]    Social Drivers of Health (SDOH) Interventions SDOH Screenings   Food Insecurity: No Food Insecurity (08/06/2024)  Recent Concern: Food Insecurity - Food Insecurity Present (06/18/2024)  Housing: Low Risk (08/06/2024)  Transportation Needs: No Transportation Needs (08/06/2024)  Utilities: Not At Risk (08/06/2024)  Alcohol Screen: Low Risk (06/18/2024)  Depression (PHQ2-9): Low Risk (07/02/2024)  Recent Concern: Depression (PHQ2-9) - Medium Risk (05/18/2024)  Financial Resource Strain: Medium Risk (06/18/2024)  Physical Activity: Inactive (06/18/2024)  Social Connections: Moderately Isolated (08/06/2024)  Stress: No Stress Concern Present (06/18/2024)  Tobacco Use: High Risk (08/06/2024)  Health Literacy: Inadequate Health Literacy (08/07/2023)     Readmission Risk Interventions    07/26/2024    9:14 AM 06/20/2023    3:17 PM  Readmission Risk Prevention Plan   Transportation Screening Complete Complete  HRI or Home Care Consult  Complete  Social Work Consult for Recovery Care Planning/Counseling  Complete  Palliative Care Screening  Not Applicable  Medication Review Oceanographer) Complete Complete  PCP or Specialist appointment within 3-5 days of discharge Complete   HRI or Home Care Consult Complete   SW Recovery Care/Counseling Consult Complete   Palliative Care Screening Complete   Skilled Nursing Facility Not Applicable

## 2024-08-12 NOTE — Progress Notes (Signed)
 Reviewed AVS, patient expressed understanding of medications, MD follow up reviewed.   Removed IV, Site clean, dry and intact.  Patient states all belongings brought to the hospital at time of admission are accounted for and packed to take home.  Picked up medications from [facility] pharmacy. Lead Transport contacted to transport patient to Discharge lounge to wait for transportation home. Unsuccessful, so Nursing staff transported patient to DC Lounge.

## 2024-08-13 ENCOUNTER — Other Ambulatory Visit: Payer: Self-pay

## 2024-08-13 ENCOUNTER — Telehealth: Payer: Self-pay

## 2024-08-13 ENCOUNTER — Other Ambulatory Visit: Payer: Self-pay | Admitting: Pulmonary Disease

## 2024-08-13 DIAGNOSIS — F17210 Nicotine dependence, cigarettes, uncomplicated: Secondary | ICD-10-CM

## 2024-08-13 MED ORDER — VARENICLINE TARTRATE 1 MG PO TABS
1.0000 mg | ORAL_TABLET | Freq: Two times a day (BID) | ORAL | 3 refills | Status: AC
  Filled 2024-08-13: qty 60, 30d supply, fill #0

## 2024-08-13 NOTE — Transitions of Care (Post Inpatient/ED Visit) (Signed)
" ° °  08/13/2024  Name: [name] [name] MRN: [id number] DOB: 03/04/1956  Today's TOC FU Call Status: Today's TOC FU Call Status:: Unsuccessful Call (1st Attempt) Unsuccessful Call (1st Attempt) Date: 08/13/24  Attempted to reach the patient regarding the most recent Inpatient/ED visit.  Patient answered call but home health agency was there to evaluate patient and she requested call back at a later time.   Follow Up Plan: Additional outreach attempts will be made to reach the patient to complete the Transitions of Care (Post Inpatient/ED visit) call.    Bing Edison MSN, RN RN Case Sales [facility]  [facility]BC[facility]-Population Health Office Hours M-F [phone] Direct Dial: [phone] Main Phone [phone]  Fax: [phone] [email or web address]    "

## 2024-08-16 ENCOUNTER — Other Ambulatory Visit: Payer: Self-pay | Admitting: Pharmacist

## 2024-08-16 NOTE — Progress Notes (Signed)
 Pharmacy Quality Measure Review  This patient is appearing on a report for the adherence measure for hypertension (ACEi/ARB) medications in 2025.   She was taken off losartan  earlier in 2025 due to hypotension. This has not been dispensed in 2026, furthermore, since 11/2023.   Unless a RAAS agent is prescribed later in 2026, she will not count against us  for this measure.   Herlene Fleeta Morris, PharmD, JAQUELINE, CPP Clinical Pharmacist [facility] [phone]

## 2024-08-17 ENCOUNTER — Telehealth: Payer: Self-pay

## 2024-08-18 ENCOUNTER — Other Ambulatory Visit (HOSPITAL_COMMUNITY): Payer: Self-pay | Admitting: Emergency Medicine

## 2024-08-18 ENCOUNTER — Telehealth (HOSPITAL_COMMUNITY): Payer: Self-pay

## 2024-08-18 NOTE — Progress Notes (Signed)
 Paramedicine Encounter    Patient ID: [name] [name], female    DOB: [birth year], 69 y.o.   MRN: [id number]   Complaints NONE  Assessment A&O x 4, skin W&D w/ good color.  Denies chest pain.  She as baseline SOB and is wearing her home O2.  Still has a productive cough, thick white secretions.  Afebrile.  She has recently been able to stop smoking and says she if very proud of herself as am I. She has gotten off track with daily weights and we discussed the importance of doing that daily. Also, pt has a humidifier for her O2 concentrator but the tree adapter on the lid of the humidifier is broken off so she can not get the benefits of the humidifer.  I will send a message to her daughter to see if she can order another humidifer.   Pt has an appointment w/ Dr. Vicci tomorrow and I will monitor her visit for any med changes.   Compliance with meds YES  Pill box filled Filled by daughter  Refills needed   Meds changes since last visit NONE    Social changes NONE   BP (!) 140/86 (BP Location: Left Arm, Patient Position: Sitting, Cuff Size: Normal)   Pulse 74   Resp 16   SpO2 96%   Weight yesterday- Last visit weight-163lb  ACTION: Home visit completed  [name] [phone] 08/18/24  Patient Care Team: Vicci Barnie [name], MD as PCP - General (Internal Medicine) Floretta Mallard, MD as PCP - Cardiology (Cardiology) Shaaron Lamar HERO, MD as Consulting Physician (Gastroenterology) Onesimo Emaline Brink, MD as Consulting Physician (Hematology) Collene Reginia BRAVO, [place] as Physician Assistant (Physician Assistant) Carolee Heron [name], RN as Case Manager  Patient Active Problem List   Diagnosis Date Noted   COPD exacerbation (HCC) 08/06/2024   Hypomagnesemia 07/21/2024   Obesity, Class II, BMI 35-39.9 07/20/2024   (HFpEF) heart failure with preserved ejection fraction (HCC) 06/18/2024   Non-insulin  dependent type 2 diabetes mellitus (HCC) 06/18/2024   History of  depression 06/18/2024   Diabetic neuropathy (HCC) 06/18/2024   Hypokalemia 01/08/2024   Acute on chronic heart failure with preserved ejection fraction (HFpEF, >= 50%) (HCC) 12/05/2023   At risk for polypharmacy 12/04/2023   Long-term current use of antidepressant 11/17/2023   Long-term current use of benzodiazepine 11/17/2023   CKD (chronic kidney disease) stage 2, GFR 60-89 ml/min 11/12/2023   Acute on chronic respiratory failure with hypoxia and hypercapnia (HCC) 10/05/2023   Chronic hypoxic respiratory failure (HCC) 09/01/2023   Continuous dependence on cigarette smoking 09/01/2023   History of CAD (coronary artery disease) 09/01/2023   Pneumonia 09/01/2023   Rhinovirus infection 06/20/2023   Full code status 06/14/2023   Nicotine  dependence 05/03/2023   Iron  deficiency anemia 11/02/2021   Major depressive disorder, recurrent, in full remission with anxious distress 05/31/2021   Chronic neck pain 02/26/2021   GAD (generalized anxiety disorder) 01/08/2021   Esophageal dysphagia 04/28/2019   Bipolar disorder (HCC) 09/17/2018   Insomnia 10/28/2017   Prediabetes 05/28/2017   Advanced directives, counseling/discussion 09/11/2015   Psoriasis 06/22/2015   COPD (chronic obstructive pulmonary disease) (HCC)GOLD E 05/18/2015   Anxiety and depression 07/10/2013   Essential hypertension 06/07/2013   Hyperlipidemia 06/07/2013   Asthma, chronic 06/07/2013   Emphysema lung (HCC) 06/07/2013   Chronic back pain 06/07/2013   CAD S/P percutaneous coronary angioplasty 06/07/2013   GERD (gastroesophageal reflux disease) 06/07/2013   Breast lump on left side at 3 o'clock  position 10/13/2012   COPD with acute exacerbation (HCC) 09/15/2009   TOBACCO ABUSE 07/17/2009   Chronic rhinitis 07/17/2009   Lung nodule < 6cm on CT 07/17/2009   Acquired hypothyroidism 12/22/2008   Chronic depression 12/22/2008   Current Medications[1] Allergies[2]   Social History   Socioeconomic History   Marital  status: Divorced    Spouse name: Not on file   Number of children: 1   Years of education: Not on file   Highest education level: 10th grade  Occupational History   Not on file  Tobacco Use   Smoking status: Every Day    Current packs/day: 0.50    Average packs/day: 0.5 packs/day for 50.0 years (25.0 ttl pk-yrs)    Types: Cigarettes   Smokeless tobacco: Never  Vaping Use   Vaping status: Some Days   Substances: Nicotine   Substance and Sexual Activity   Alcohol use: Yes    Comment: once a week   Drug use: Yes    Types: Marijuana    Comment: history of cocaine use, been about a year, per patient (12/18/21)   Sexual activity: Not Currently    Birth control/protection: Surgical  Other Topics Concern   Not on file  Social History Narrative   Not on file   Social Drivers of Health   Tobacco Use: High Risk (08/06/2024)   Patient History    Smoking Tobacco Use: Every Day    Smokeless Tobacco Use: Never    Passive Exposure: Not on file  Financial Resource Strain: Medium Risk (06/18/2024)   Overall Financial Resource Strain (CARDIA)    Difficulty of Paying Living Expenses: Somewhat hard  Food Insecurity: No Food Insecurity (08/06/2024)   Epic    Worried About Programme Researcher, Broadcasting/film/video in the Last Year: Never true    Ran Out of Food in the Last Year: Never true  Recent Concern: Food Insecurity - Food Insecurity Present (06/18/2024)   Epic    Worried About Programme Researcher, Broadcasting/film/video in the Last Year: Sometimes true    Ran Out of Food in the Last Year: Sometimes true  Transportation Needs: No Transportation Needs (08/06/2024)   Epic    Lack of Transportation (Medical): No    Lack of Transportation (Non-Medical): No  Physical Activity: Inactive (06/18/2024)   Exercise Vital Sign    Days of Exercise per Week: 0 days    Minutes of Exercise per Session: Not on file  Stress: No Stress Concern Present (06/18/2024)   [employer] of Occupational Health - Occupational Stress Questionnaire     Feeling of Stress: Only a little  Social Connections: Moderately Isolated (08/06/2024)   Social Connection and Isolation Panel    Frequency of Communication with Friends and Family: More than three times a week    Frequency of Social Gatherings with Friends and Family: Once a week    Attends Religious Services: Never    Database Administrator or Organizations: No    Attends Banker Meetings: 1 to 4 times per year    Marital Status: Divorced  Intimate Partner Violence: Not At Risk (08/06/2024)   Epic    Fear of Current or Ex-Partner: No    Emotionally Abused: No    Physically Abused: No    Sexually Abused: No  Depression (PHQ2-9): Low Risk (07/02/2024)   Depression (PHQ2-9)    PHQ-2 Score: 0  Recent Concern: Depression (PHQ2-9) - Medium Risk (05/18/2024)   Depression (PHQ2-9)    PHQ-2 Score: 9  Alcohol Screen: Low Risk (06/18/2024)   Alcohol Screen    Last Alcohol Screening Score (AUDIT): 1  Housing: Low Risk (08/06/2024)   Epic    Unable to Pay for Housing in the Last Year: No    Number of Times Moved in the Last Year: 0    Homeless in the Last Year: No  Utilities: Not At Risk (08/06/2024)   Epic    Threatened with loss of utilities: No  Health Literacy: Inadequate Health Literacy (08/07/2023)   B1300 Health Literacy    Frequency of need for help with medical instructions: Always    Physical Exam      Future Appointments  Date Time Provider Department Center  08/19/2024  4:10 PM Vicci Barnie NOVAK, MD [facility] [address]  08/24/2024  1:00 PM [facility] PFT ROOM [facility] [address]  08/24/2024  2:30 PM Charley Conger, PA-C [facility] [address]  09/10/2024  8:00 AM Floretta Mallard, MD [facility] H&V  05/06/2025  1:30 PM [facility] LAB [facility] None  05/06/2025  2:00 PM Onesimo Emaline Brink, MD [facility] None          [1]  Current Outpatient Medications:    acetaminophen  (TYLENOL ) 500 MG tablet, Take 1 tablet (500 mg total) by mouth every 6 (six) hours as  needed for mild pain (pain score 1-3), fever or headache., Disp: , Rfl:    albuterol  (VENTOLIN  HFA) 108 (90 Base) MCG/ACT inhaler, Inhale 2 puffs into the lungs every 4 (four) hours as needed for wheezing or shortness of breath., Disp: 20.1 g, Rfl: 1   alendronate  (FOSAMAX ) 70 MG tablet, Take 1 tablet (70 mg total) by mouth every 7 (seven) days. Take with a full glass of water on an empty stomach., Disp: 12 tablet, Rfl: 0   aspirin  EC 81 MG tablet, Take 81 mg by mouth in the morning., Disp: , Rfl:    atorvastatin  (LIPITOR ) 80 MG tablet, Take 1 tablet (80 mg total) by mouth daily., Disp: 90 tablet, Rfl: 3   busPIRone  (BUSPAR ) 15 MG tablet, Take 2 tablets (30 mg total) by mouth in the morning AND 1 tablet (15 mg total) daily at 12 noon AND 1 tablet (15 mg total) every evening., Disp: 360 tablet, Rfl: 0   clonazePAM  (KLONOPIN ) 0.5 MG tablet, Take 1 tablet (0.5 mg total) by mouth 2 (two) times daily as needed., Disp: 60 tablet, Rfl: 0   doxepin  (SINEQUAN ) 75 MG capsule, Take 1 capsule (75 mg total) by mouth at bedtime., Disp: 90 capsule, Rfl: 0   Dupilumab  (DUPIXENT ) 300 MG/2ML SOAJ, Inject 300 mg into the skin every 14 (fourteen) days., Disp: 4 mL, Rfl: 3   empagliflozin  (JARDIANCE ) 10 MG TABS tablet, Take 1 tablet (10 mg total) by mouth daily before breakfast., Disp: 90 tablet, Rfl: 3   escitalopram  (LEXAPRO ) 20 MG tablet, Take 1 tablet (20 mg total) by mouth daily., Disp: 90 tablet, Rfl: 0   ezetimibe  (ZETIA ) 10 MG tablet, Take 1 tablet (10 mg total) by mouth daily., Disp: 90 tablet, Rfl: 3   Fluticasone -Umeclidin-Vilant (TRELEGY ELLIPTA ) 200-62.5-25 MCG/ACT AEPB, Inhale 1 puff into the lungs daily., Disp: 60 each, Rfl: 1   gabapentin  (NEURONTIN ) 600 MG tablet, Take 1 tablet (600 mg total) by mouth 2 (two) times daily., Disp: 180 tablet, Rfl: 2   guaiFENesin -dextromethorphan  (ROBITUSSIN DM) 100-10 MG/5ML syrup, Take 15 mLs by mouth every 4 (four) hours as needed for cough., Disp: 118 mL, Rfl: 0    hydrOXYzine  (ATARAX ) 25 MG tablet, Take 1 tablet (  25 mg total) by mouth at bedtime as needed., Disp: 30 tablet, Rfl: 2   ipratropium-albuterol  (DUONEB) 0.5-2.5 (3) MG/3ML SOLN, Take 3 mLs by nebulization every 8 (eight) hours., Disp: 90 mL, Rfl: 1   levothyroxine  (SYNTHROID ) 112 MCG tablet, Take 1 tablet (112 mcg total) by mouth daily., Disp: 90 tablet, Rfl: 2   metFORMIN  (GLUCOPHAGE ) 500 MG tablet, Take 1 tablet (500 mg total) by mouth 2 (two) times daily with a meal. (Patient taking differently: Take 500 mg by mouth daily with breakfast.), Disp: 180 tablet, Rfl: 1   mirtazapine  (REMERON ) 45 MG tablet, Take 1 tablet (45 mg total) by mouth at bedtime., Disp: 90 tablet, Rfl: 0   montelukast  (SINGULAIR ) 10 MG tablet, Take 1 tablet (10 mg total) by mouth at bedtime., Disp: 90 tablet, Rfl: 2   nebivolol  (BYSTOLIC ) 10 MG tablet, Take 1 tablet (10 mg total) by mouth daily., Disp: 90 tablet, Rfl: 3   nitroGLYCERIN  (NITROSTAT ) 0.4 MG SL tablet, Place 1 tablet (0.4 mg total) under the tongue every 5 (five) minutes as needed for chest pain., Disp: 25 tablet, Rfl: 3   OHTUVAYRE  3 MG/2.5ML SUSP, Take 1 vial by nebulization in the morning and at bedtime., Disp: , Rfl:    OXYGEN , Inhale 4 L/min into the lungs continuous., Disp: , Rfl:    ticagrelor  (BRILINTA ) 60 MG TABS tablet, Take 1 tablet (60 mg total) by mouth 2 (two) times daily., Disp: 180 tablet, Rfl: 2   torsemide  (DEMADEX ) 20 MG tablet, Take 1-2 tablets (20-40 mg total) by mouth daily. Take 1 or 2 tablets, depending on lower extremity edema and shortness of breath, Disp: , Rfl:    varenicline  (CHANTIX ) 1 MG tablet, Take 1 tablet (1 mg total) by mouth 2 (two) times daily., Disp: 60 tablet, Rfl: 3   Vitamin D , Ergocalciferol , (DRISDOL ) 1.25 MG (50000 UNIT) CAPS capsule, Take 1 capsule (50,000 Units total) by mouth every 7 (seven) days., Disp: 12 capsule, Rfl: 0   omeprazole  (PRILOSEC) 40 MG capsule, Take 1 capsule (40 mg total) by mouth 2 (two) times daily  before a meal. NEED APPOINTMENT FOR ADDITIONAL REFILLS, Disp: 60 capsule, Rfl: 0 [2]  Allergies Allergen Reactions   Avocado Anaphylaxis   Latex Shortness Of Breath and Rash   Codeine  Nausea Only

## 2024-08-18 NOTE — Telephone Encounter (Signed)
 HF Paramedicine Team Based Care Meeting  [facility] Team Based Meeting-   [place] HF Paramedicine  Katie Leodis Powell Mirza Dede Claudene Bradley [facility] admit within the last 30 days- COPD 08/07/23-08/13/23  Medications concerns- None, daughter helps with meds.   Transportation issues- daughter drives and or public transit if needed-   Education needs- None  SDOH- HH w/ [facility]   Eligible for discharge- No not at present with recent admissions.

## 2024-08-19 ENCOUNTER — Ambulatory Visit: Admitting: Internal Medicine

## 2024-08-19 ENCOUNTER — Other Ambulatory Visit (HOSPITAL_COMMUNITY): Payer: Self-pay

## 2024-08-19 VITALS — BP 115/79 | HR 94 | Ht 61.0 in | Wt 165.0 lb

## 2024-08-19 DIAGNOSIS — E119 Type 2 diabetes mellitus without complications: Secondary | ICD-10-CM

## 2024-08-19 DIAGNOSIS — Z87891 Personal history of nicotine dependence: Secondary | ICD-10-CM

## 2024-08-19 DIAGNOSIS — J4489 Other specified chronic obstructive pulmonary disease: Secondary | ICD-10-CM

## 2024-08-19 DIAGNOSIS — F3342 Major depressive disorder, recurrent, in full remission: Secondary | ICD-10-CM

## 2024-08-19 DIAGNOSIS — G252 Other specified forms of tremor: Secondary | ICD-10-CM

## 2024-08-19 DIAGNOSIS — F411 Generalized anxiety disorder: Secondary | ICD-10-CM

## 2024-08-19 DIAGNOSIS — Z23 Encounter for immunization: Secondary | ICD-10-CM

## 2024-08-19 DIAGNOSIS — L853 Xerosis cutis: Secondary | ICD-10-CM

## 2024-08-19 DIAGNOSIS — I5032 Chronic diastolic (congestive) heart failure: Secondary | ICD-10-CM

## 2024-08-19 DIAGNOSIS — E039 Hypothyroidism, unspecified: Secondary | ICD-10-CM

## 2024-08-19 DIAGNOSIS — E1159 Type 2 diabetes mellitus with other circulatory complications: Secondary | ICD-10-CM

## 2024-08-19 DIAGNOSIS — I25110 Atherosclerotic heart disease of native coronary artery with unstable angina pectoris: Secondary | ICD-10-CM

## 2024-08-19 DIAGNOSIS — F319 Bipolar disorder, unspecified: Secondary | ICD-10-CM

## 2024-08-19 DIAGNOSIS — Z9981 Dependence on supplemental oxygen: Secondary | ICD-10-CM

## 2024-08-19 MED ORDER — AMOXICILLIN-POT CLAVULANATE 500-125 MG PO TABS
1.0000 | ORAL_TABLET | Freq: Three times a day (TID) | ORAL | 0 refills | Status: AC
  Filled 2024-08-19: qty 10, 4d supply, fill #0

## 2024-08-19 MED ORDER — METFORMIN HCL 500 MG PO TABS
500.0000 mg | ORAL_TABLET | Freq: Two times a day (BID) | ORAL | 1 refills | Status: AC
  Filled 2024-08-19: qty 180, 90d supply, fill #0

## 2024-08-19 MED ORDER — ATORVASTATIN CALCIUM 80 MG PO TABS
80.0000 mg | ORAL_TABLET | Freq: Every day | ORAL | 3 refills | Status: AC
  Filled 2024-08-19: qty 90, 90d supply, fill #0

## 2024-08-19 MED ORDER — ZOSTER VAC RECOMB ADJUVANTED 50 MCG/0.5ML IM SUSR: 0.5000 mL | Freq: Once | INTRAMUSCULAR | 0 refills | Status: AC

## 2024-08-19 NOTE — Progress Notes (Unsigned)
 "   Patient ID: [name] [name], female    DOB: 1956/04/29  MRN: [id number]  CC: Diabetes (DM f/u. /Cough, phlegm, bloody sputum X1 week/Bilateral hand tremors x3-4 mo/Memory issues reported by daughter/Requesting PCS for home /Already received flu vax)   Subjective: [name] [name] is a 69 y.o. female who presents for chronic ds management and hosp f/u. Her daughter and caregiver is with her. Her chronic medical issues include:  Patient with history of DM (A1C 6.8 08/07/2023), GERD, hypothyroidism, HTN, HL, CAD with previous stent, CHFpEF, COPD, chronic hypoxic resp failure on 2 L O2 continuous, substance abuse, depression/anxiety and tob dep, essential thrombocytosis, iron  deficiency anemia, Osteopenia with fragility rib fractures on Fosamax    Discussed the use of AI scribe software for clinical note transcription with the patient, who gave verbal consent to proceed.  History of Present Illness [name] [name] is a 69 year old female with chronic respiratory issues who presents for f/u from recent hospitalizations for pneumonia and sepsis.  Asthma-COPD/chronic hypoxia on O2 she has experienced two recent hospitalizations, the first in January due to a flare-up triggered by rhinovirus, and the second from January 23rd to 29th for pneumonia and sepsis. Since discharge, she has persistent symptoms including coughing and fatigue, which significantly impact her daily activities. She feels 'worn out' and needs to return to bed shortly after waking up.  After being treated with doxycycline  and Rocephin  in the hospital, she was discharged home on 2 days of Augmentin  which she has completed.  She feels that the worsening cough is coming back and feels that she may need more antibiotics.  She has not had any fever.  She is currently on 4 liters of continuous oxygen , which is her baseline and a reduction from the 5 liters required during her hospitalization. Her home oxygen  levels range from 94% to 99%.  She has a  chronic wet cough, which is worse than usual. Her current respiratory medications include Dupixent  injections every two weeks, Trelegy inhaler daily, albuterol  as needed, and a nebulizer with Duo Neb PRN and Ohtuvayre  twice a day.   Tob : She has a history of smoking but quit a little over 1 mth ago. She manages cravings with sugar-free lollipops and avoids keeping cigarettes at home.  Her diabetes management includes metformin  500 mg once daily (should be twice a day) due to irregular eating habits, and Jardiance  10 mg daily. Her A1c has increased from 6.5 in November to 7.6 drawn 9 days ago. She has had recent hospitalizations and has been on and off prednisone . She struggles with maintaining a consistent diet, often eating only one substantial meal a day due to decrease appetite.  CAD/HTN/CHF: She experienced a recent episode of chest pain, which resolved with one sublingual nitroglycerin . Her cardiologist recently adjusted her congestive heart failure medication, switching from a previous diuretic to torsemide , currently at 30 mg daily. She reports no recent leg swelling but notes abdominal bloating. Her current heart medications include atorvastatin  80 mg daily, Zetia , and Bystolic  10 mg daily.   Hypothyroid: She is also on levothyroxine  112 mcg for thyroid  management, which was last checked eight months ago.  She is under psychiatric care for bipolar disorder, anxiety, and depression, taking Buspar , hydroxyzine , clonazepam , Remeron , and doxepin . She reports these medications help manage her symptoms effectively.  She reports new onset of hand tremors over the past few months, which vary in severity. She has a family history of Parkinson's disease, as her grandfather had it. She  is unsure if the tremors correlate with prednisone  use.    Patient Active Problem List   Diagnosis Date Noted   COPD exacerbation (HCC) 08/06/2024   Hypomagnesemia 07/21/2024   Obesity, Class II, BMI 35-39.9  07/20/2024   (HFpEF) heart failure with preserved ejection fraction (HCC) 06/18/2024   Non-insulin  dependent type 2 diabetes mellitus (HCC) 06/18/2024   History of depression 06/18/2024   Diabetic neuropathy (HCC) 06/18/2024   Hypokalemia 01/08/2024   Acute on chronic heart failure with preserved ejection fraction (HFpEF, >= 50%) (HCC) 12/05/2023   At risk for polypharmacy 12/04/2023   Long-term current use of antidepressant 11/17/2023   Long-term current use of benzodiazepine 11/17/2023   CKD (chronic kidney disease) stage 2, GFR 60-89 ml/min 11/12/2023   Acute on chronic respiratory failure with hypoxia and hypercapnia (HCC) 10/05/2023   Chronic hypoxic respiratory failure (HCC) 09/01/2023   Continuous dependence on cigarette smoking 09/01/2023   History of CAD (coronary artery disease) 09/01/2023   Pneumonia 09/01/2023   Rhinovirus infection 06/20/2023   Full code status 06/14/2023   Nicotine  dependence 05/03/2023   Iron  deficiency anemia 11/02/2021   Major depressive disorder, recurrent, in full remission with anxious distress 05/31/2021   Chronic neck pain 02/26/2021   GAD (generalized anxiety disorder) 01/08/2021   Esophageal dysphagia 04/28/2019   Bipolar disorder (HCC) 09/17/2018   Insomnia 10/28/2017   Prediabetes 05/28/2017   Advanced directives, counseling/discussion 09/11/2015   Psoriasis 06/22/2015   COPD (chronic obstructive pulmonary disease) (HCC)GOLD E 05/18/2015   Anxiety and depression 07/10/2013   Essential hypertension 06/07/2013   Hyperlipidemia 06/07/2013   Asthma, chronic 06/07/2013   Emphysema lung (HCC) 06/07/2013   Chronic back pain 06/07/2013   CAD S/P percutaneous coronary angioplasty 06/07/2013   GERD (gastroesophageal reflux disease) 06/07/2013   Breast lump on left side at 3 o'clock position 10/13/2012   COPD with acute exacerbation (HCC) 09/15/2009   TOBACCO ABUSE 07/17/2009   Chronic rhinitis 07/17/2009   Lung nodule < 6cm on CT 07/17/2009    Acquired hypothyroidism 12/22/2008   Chronic depression 12/22/2008     Medications Ordered Prior to Encounter[1]  Allergies[2]  Social History   Socioeconomic History   Marital status: Divorced    Spouse name: Not on file   Number of children: 1   Years of education: Not on file   Highest education level: 10th grade  Occupational History   Not on file  Tobacco Use   Smoking status: Every Day    Current packs/day: 0.50    Average packs/day: 0.5 packs/day for 50.0 years (25.0 ttl pk-yrs)    Types: Cigarettes   Smokeless tobacco: Never  Vaping Use   Vaping status: Some Days   Substances: Nicotine   Substance and Sexual Activity   Alcohol use: Yes    Comment: once a week   Drug use: Yes    Types: Marijuana    Comment: history of cocaine use, been about a year, per patient (12/18/21)   Sexual activity: Not Currently    Birth control/protection: Surgical  Other Topics Concern   Not on file  Social History Narrative   Not on file   Social Drivers of Health   Tobacco Use: High Risk (08/06/2024)   Patient History    Smoking Tobacco Use: Every Day    Smokeless Tobacco Use: Never    Passive Exposure: Not on file  Financial Resource Strain: Medium Risk (06/18/2024)   Overall Financial Resource Strain (CARDIA)    Difficulty of Paying Living Expenses: Somewhat  hard  Food Insecurity: No Food Insecurity (08/06/2024)   Epic    Worried About Programme Researcher, Broadcasting/film/video in the Last Year: Never true    Ran Out of Food in the Last Year: Never true  Recent Concern: Food Insecurity - Food Insecurity Present (06/18/2024)   Epic    Worried About Programme Researcher, Broadcasting/film/video in the Last Year: Sometimes true    Ran Out of Food in the Last Year: Sometimes true  Transportation Needs: No Transportation Needs (08/06/2024)   Epic    Lack of Transportation (Medical): No    Lack of Transportation (Non-Medical): No  Physical Activity: Inactive (06/18/2024)   Exercise Vital Sign    Days of Exercise per Week: 0  days    Minutes of Exercise per Session: Not on file  Stress: No Stress Concern Present (06/18/2024)   [employer] of Occupational Health - Occupational Stress Questionnaire    Feeling of Stress: Only a little  Social Connections: Moderately Isolated (08/06/2024)   Social Connection and Isolation Panel    Frequency of Communication with Friends and Family: More than three times a week    Frequency of Social Gatherings with Friends and Family: Once a week    Attends Religious Services: Never    Database Administrator or Organizations: No    Attends Engineer, Structural: 1 to 4 times per year    Marital Status: Divorced  Intimate Partner Violence: Not At Risk (08/06/2024)   Epic    Fear of Current or Ex-Partner: No    Emotionally Abused: No    Physically Abused: No    Sexually Abused: No  Depression (PHQ2-9): Low Risk (07/02/2024)   Depression (PHQ2-9)    PHQ-2 Score: 0  Recent Concern: Depression (PHQ2-9) - Medium Risk (05/18/2024)   Depression (PHQ2-9)    PHQ-2 Score: 9  Alcohol Screen: Low Risk (06/18/2024)   Alcohol Screen    Last Alcohol Screening Score (AUDIT): 1  Housing: Low Risk (08/06/2024)   Epic    Unable to Pay for Housing in the Last Year: No    Number of Times Moved in the Last Year: 0    Homeless in the Last Year: No  Utilities: Not At Risk (08/06/2024)   Epic    Threatened with loss of utilities: No  Health Literacy: Inadequate Health Literacy (08/07/2023)   B1300 Health Literacy    Frequency of need for help with medical instructions: Always    Family History  Problem Relation Age of Onset   Heart disease Mother    Hypertension Mother    Stroke Mother    Mental illness Mother    Heart disease Father    Hypertension Father    Diabetes Father    Colon polyps Brother    Heart disease Maternal Grandfather    Cancer Paternal Grandmother        mouth   Anesthesia problems Daughter    Breast cancer Maternal Aunt    Throat cancer Maternal Uncle     Thyroid  disease Paternal Aunt    Hypotension Neg Hx    Malignant hyperthermia Neg Hx    Pseudochol deficiency Neg Hx    Colon cancer Neg Hx    Stomach cancer Neg Hx    Esophageal cancer Neg Hx    Pancreatic cancer Neg Hx    Rectal cancer Neg Hx     Past Surgical History:  Procedure Laterality Date   ABDOMINAL EXPLORATION SURGERY  1977   ABDOMINAL HYSTERECTOMY  1977   left one of my ovaries   BACK SURGERY     BIOPSY  05/27/2019   Procedure: BIOPSY;  Surgeon: Shaaron Lamar HERO, MD;  Location: [facility];  Service: Endoscopy;;   BREAST BIOPSY Left 11/2021   x2   COLONOSCOPY  2011   Dr. Teressa: Mild diverticulosis, descending diminutive colon polyp (not retrieved), next colonoscopy 10 years   CORONARY ANGIOPLASTY WITH STENT PLACEMENT  2011 X2   regular stents didn't work; had to go back in in ~ 1 month and put in medicated stents   DILATION AND CURETTAGE OF UTERUS     ESOPHAGOGASTRODUODENOSCOPY     ESOPHAGOGASTRODUODENOSCOPY (EGD) WITH PROPOFOL  N/A 05/27/2019   normal esophagus, dilation, erosive gastropathy s/p biopsy, normal duodenum. Negative H.pylori.    LEFT HEART CATH AND CORONARY ANGIOGRAPHY N/A 03/26/2017   Procedure: LEFT HEART CATH AND CORONARY ANGIOGRAPHY;  Surgeon: Claudene Victory ORN, MD;  Location: [facility];  Service: Cardiovascular;  Laterality: N/A;   LUMBAR LAMINECTOMY/DECOMPRESSION MICRODISCECTOMY  08/05/2011   Procedure: LUMBAR LAMINECTOMY/DECOMPRESSION MICRODISCECTOMY;  Surgeon: Lynwood JONELLE Mill, MD;  Location: [facility];  Service: Neurosurgery;  Laterality: Right;  Right Lumbar four-five extraforaminal discectomy   MALONEY DILATION N/A 05/27/2019   Procedure: AGAPITO DILATION;  Surgeon: Shaaron Lamar HERO, MD;  Location: [facility];  Service: Endoscopy;  Laterality: N/A;  54   SHOULDER ARTHROSCOPY WITH ROTATOR CUFF REPAIR AND OPEN BICEPS TENODESIS Right 12/24/2021   Procedure: RIGHT SHOULDER ARTHROSCOPY WITH ROTATOR CUFF REPAIR AND BICEPS TENODESIS;   Surgeon: Barbarann Oneil BROCKS, MD;  Location: [facility];  Service: Orthopedics;  Laterality: Right;   TONSILLECTOMY     as a child   UPPER GASTROINTESTINAL ENDOSCOPY      ROS: Review of Systems Negative except as stated above  PHYSICAL EXAM: BP 115/79 (BP Location: Left Arm, Patient Position: Sitting, Cuff Size: Normal)   Pulse 94   Ht 5' 1 (1.549 m)   Wt 165 lb (74.8 kg)   SpO2 99%   BMI 31.18 kg/m   On 4 liters Physical Exam  General appearance - alert, well appearing, elderly female and in no distress Mental status - normal mood, behavior, speech, dress, motor activity, and thought processes Neck - supple, no significant adenopathy Chest -sounds significantly decreased bilaterally with a few scattered wheezes. Heart - normal rate, regular rhythm, normal S1, S2, no murmurs, rubs, clicks or gallops Extremities -no lower extremity edema. Neuro: No resting tremor.  Noted to have tremor in both hands with outstretched arms and when pointing to objects.  Also noted to have mild intermittent jerks of both feet while I was doing diabetic foot exam. Diabetic Foot Exam - Simple   Simple Foot Form Diabetic Foot exam was performed with the following findings: Yes 08/19/2024  6:26 PM  Visual Inspection See comments: Yes Sensation Testing Intact to touch and monofilament testing bilaterally: Yes Pulse Check See comments: Yes Comments Toenails are slightly overgrown.  Dorsalis pedis and posterior tibialis pulses 2+ bilaterally.         Latest Ref Rng & Units 08/11/2024    3:59 AM 08/10/2024    4:02 AM 08/09/2024    2:24 AM  CMP  Glucose 70 - 99 mg/dL 840  763  865   BUN 8 - 23 mg/dL 28  24  33   Creatinine 0.44 - 1.00 mg/dL 9.15  9.20  8.77   Sodium 135 - 145 mmol/L 140  140  141   Potassium  3.5 - 5.1 mmol/L 4.1  4.6  3.8   Chloride 98 - 111 mmol/L 99  99  98   CO2 22 - 32 mmol/L 35  34  33   Calcium  8.9 - 10.3 mg/dL 9.5  9.3  9.0    Lipid Panel     Component Value Date/Time   CHOL  145 12/26/2023 1633   TRIG 100 12/26/2023 1633   HDL 79 12/26/2023 1633   CHOLHDL 1.8 12/26/2023 1633   CHOLHDL 3.8 04/16/2016 0934   VLDL 15 04/16/2016 0934   LDLCALC 48 12/26/2023 1633    CBC    Component Value Date/Time   WBC 20.7 (H) 08/11/2024 0359   RBC 3.59 (L) 08/11/2024 0359   HGB 10.4 (L) 08/11/2024 0359   HGB 11.9 07/02/2024 1512   HCT 33.0 (L) 08/11/2024 0359   HCT 38.7 07/02/2024 1512   PLT 502 (H) 08/11/2024 0359   PLT 483 (H) 07/02/2024 1512   MCV 91.9 08/11/2024 0359   MCV 93 07/02/2024 1512   MCH 29.0 08/11/2024 0359   MCHC 31.5 08/11/2024 0359   RDW 15.7 (H) 08/11/2024 0359   RDW 14.8 07/02/2024 1512   LYMPHSABS 2.4 08/06/2024 1720   LYMPHSABS 2.6 04/05/2019 0926   MONOABS 1.5 (H) 08/06/2024 1720   EOSABS 0.1 08/06/2024 1720   EOSABS 0.3 04/05/2019 0926   BASOSABS 0.0 08/06/2024 1720   BASOSABS 0.1 04/05/2019 0926    ASSESSMENT AND PLAN: 1. Asthma-COPD overlap syndrome (HCC) (Primary) *** - amoxicillin -clavulanate (AUGMENTIN ) 500-125 MG tablet; Take 1 tablet by mouth 3 (three) times daily.  Dispense: 10 tablet; Refill: 0  2. Chronic hypoxic respiratory failure, on home oxygen  therapy (HCC) ***  3. Former smoker Commended her on quitting.  Encouraged her to remain tobacco free.  4. Coronary artery disease involving native coronary artery of native heart with unstable angina pectoris Patient with an episode of angina at rest a few days ago that resolved with 1 sublingual nitroglycerin .  She has an upcoming appointment with her cardiologist. She will continue atorvastatin , Zetia , and Bystolic , Brilinta /ASA - atorvastatin  (LIPITOR ) 80 MG tablet; Take 1 tablet (80 mg total) by mouth daily.  Dispense: 90 tablet; Refill: 3  5. Type 2 diabetes mellitus in patient with obesity (HCC) A1c mildly above goal.  Most likely due to her being on and off prednisone  over the past several weeks.  She has been taking the metformin  just once a day because she feels she  does not have much of an appetite in the mornings.  She only eats eggs in the mornings.  Encouraged to take the metformin  twice a day as prescribed.  Continue Jardiance  10 mg daily. - metFORMIN  (GLUCOPHAGE ) 500 MG tablet; Take 1 tablet (500 mg total) by mouth 2 (two) times daily with a meal.  Dispense: 180 tablet; Refill: 1  6. Coarse tremors Likely essential tremor.  Other possibility is that it could be due to prednisone .  Advised to observe if the tremor worsens when she is on high-dose prednisone .  We will also need to check her thyroid  level to make sure that TSH is within normal range. - Ambulatory referral to Neurology  7. GAD (generalized anxiety disorder) 8. Major depressive disorder, recurrent, in full remission with anxious distress Plugged in with behavioral health and is stable on her current medications listed above.  9. Acquired hypothyroidism Continue levothyroxine  112 mcg daily. - TSH; Future  10. Bipolar I disorder (HCC) See #8 above  11. Dry skin Recommend using  Aveeno intensive care lotion or palma cocoa butter lotion from over-the-counter daily.  12. Hypertension associated with type 2 diabetes mellitus (HCC) At goal.  Continue Bystolic  10 mg daily  13. Need for shingles vaccine Printed prescription given to patient to take to the pharmacy - Zoster Vaccine Adjuvanted (SHINGRIX ) injection; Inject 0.5 mLs into the muscle once for 1 dose.  Dispense: 0.5 mL; Refill: 0  14. Chronic diastolic congestive heart failure (HCC) ***  Assessment and Plan Assessment & Plan Asthma-COPD overlap syndrome with recent exacerbation Recent exacerbation likely due to pneumonia and sepsis. Oxygen  requirement increased during hospitalization, now stable on 4 L. Dupixent  resumed. - Continue Dupixent  injections every two weeks. - Continue Trelegy and albuterol  as needed. - Use DuoNeb as needed for shortness of breath. - Use Ohtuvayre  twice daily. - Prescribed Augmentin  for 5 days  for persistent symptoms.  Type 2 diabetes mellitus with other specified complication A1c increased to 7.6% from 6.5%, likely due to hospitalizations and prednisone . Currently on metformin  and Jardiance . - Encouraged metformin  500 mg twice daily with meals. - Continue Jardiance . - Consider Glycine shakes for nutritional supplementation.  Chronic heart failure Torsemide  adjusted to 1.5 tablets daily. No leg swelling, but abdominal fluid retention noted. Blood pressure controlled. - Continue torsemide  1.5 tablets daily. - Continue Bystolic  10 mg daily. - Monitor weight and fluid status.  Coronary artery disease Recent chest pain episode relieved by nitroglycerin . On atorvastatin  and ezetimibe . Cardiology follow-up scheduled. - Continue atorvastatin  80 mg daily. - Continue ezetimibe  10 mg daily. - Ensure nitroglycerin  is up to date and available. - Follow up with cardiologist at the end of the month.  Hyperlipidemia Managed with atorvastatin  and ezetimibe . - Continue atorvastatin  80 mg daily. - Continue ezetimibe  10 mg daily.  Acquired hypothyroidism Thyroid  levels normal 8 months ago. On levothyroxine . - Ordered thyroid  function test at next specialist visit.  Major depressive disorder and generalized anxiety disorder Reports doing well on current psychiatric regimen. - Continue current psychiatric medications.  Essential tremor New tremor symptoms, likely essential tremor. Family history of Parkinson's. Worsens with action. - Ordered thyroid  function test to rule out hyperthyroidism. - Will refer to neurologist for further evaluation.  General health maintenance Up to date with flu vaccine and mammogram. Eye exam scheduled. Needs second shingles vaccine. - Counseled on possible injection site pain and arm soreness with shingles vaccine. - Provided prescription for second shingles vaccine. - Encouraged regular foot care and use of lotion for dry skin. - Will schedule podiatry  appointment for foot care.     1. Coronary artery disease involving native coronary artery of native heart without angina pectoris ***  2. Hyperlipidemia LDL goal <70 ***  3. GAD (generalized anxiety disorder) ***  4. Major depressive disorder, recurrent, in full remission with anxious distress ***  5. Type 2 diabetes mellitus with other specified complication, without long-term current use of insulin  [facility]) ***    Patient was given the opportunity to ask questions.  Patient verbalized understanding of the plan and was able to repeat key elements of the plan.   This documentation was completed using Paediatric nurse.  Any transcriptional errors are unintentional.  No orders of the defined types were placed in this encounter.    Requested Prescriptions   Pending Prescriptions Disp Refills   atorvastatin  (LIPITOR ) 80 MG tablet 90 tablet 3    Sig: Take 1 tablet (80 mg total) by mouth daily.   escitalopram  (LEXAPRO ) 20 MG tablet 90 tablet 0  Sig: Take 1 tablet (20 mg total) by mouth daily.   gabapentin  (NEURONTIN ) 600 MG tablet 180 tablet 2    Sig: Take 1 tablet (600 mg total) by mouth 2 (two) times daily.   metFORMIN  (GLUCOPHAGE ) 500 MG tablet 180 tablet 1    Sig: Take 1 tablet (500 mg total) by mouth 2 (two) times daily with a meal.    No follow-ups on file.  Barnie Louder, MD, FACP     [1]  Current Outpatient Medications on File Prior to Visit  Medication Sig Dispense Refill   acetaminophen  (TYLENOL ) 500 MG tablet Take 1 tablet (500 mg total) by mouth every 6 (six) hours as needed for mild pain (pain score 1-3), fever or headache.     albuterol  (VENTOLIN  HFA) 108 (90 Base) MCG/ACT inhaler Inhale 2 puffs into the lungs every 4 (four) hours as needed for wheezing or shortness of breath. 20.1 g 1   alendronate  (FOSAMAX ) 70 MG tablet Take 1 tablet (70 mg total) by mouth every 7 (seven) days. Take with a full glass of water on an empty stomach. 12  tablet 0   aspirin  EC 81 MG tablet Take 81 mg by mouth in the morning.     atorvastatin  (LIPITOR ) 80 MG tablet Take 1 tablet (80 mg total) by mouth daily. 90 tablet 3   busPIRone  (BUSPAR ) 15 MG tablet Take 2 tablets (30 mg total) by mouth in the morning AND 1 tablet (15 mg total) daily at 12 noon AND 1 tablet (15 mg total) every evening. 360 tablet 0   clonazePAM  (KLONOPIN ) 0.5 MG tablet Take 1 tablet (0.5 mg total) by mouth 2 (two) times daily as needed. 60 tablet 0   doxepin  (SINEQUAN ) 75 MG capsule Take 1 capsule (75 mg total) by mouth at bedtime. 90 capsule 0   Dupilumab  (DUPIXENT ) 300 MG/2ML SOAJ Inject 300 mg into the skin every 14 (fourteen) days. 4 mL 3   empagliflozin  (JARDIANCE ) 10 MG TABS tablet Take 1 tablet (10 mg total) by mouth daily before breakfast. 90 tablet 3   escitalopram  (LEXAPRO ) 20 MG tablet Take 1 tablet (20 mg total) by mouth daily. 90 tablet 0   ezetimibe  (ZETIA ) 10 MG tablet Take 1 tablet (10 mg total) by mouth daily. 90 tablet 3   Fluticasone -Umeclidin-Vilant (TRELEGY ELLIPTA ) 200-62.5-25 MCG/ACT AEPB Inhale 1 puff into the lungs daily. 60 each 1   gabapentin  (NEURONTIN ) 600 MG tablet Take 1 tablet (600 mg total) by mouth 2 (two) times daily. 180 tablet 2   guaiFENesin -dextromethorphan  (ROBITUSSIN DM) 100-10 MG/5ML syrup Take 15 mLs by mouth every 4 (four) hours as needed for cough. 118 mL 0   hydrOXYzine  (ATARAX ) 25 MG tablet Take 1 tablet (25 mg total) by mouth at bedtime as needed. 30 tablet 2   ipratropium-albuterol  (DUONEB) 0.5-2.5 (3) MG/3ML SOLN Take 3 mLs by nebulization every 8 (eight) hours. 90 mL 1   levothyroxine  (SYNTHROID ) 112 MCG tablet Take 1 tablet (112 mcg total) by mouth daily. 90 tablet 2   metFORMIN  (GLUCOPHAGE ) 500 MG tablet Take 1 tablet (500 mg total) by mouth 2 (two) times daily with a meal. (Patient taking differently: Take 500 mg by mouth daily with breakfast.) 180 tablet 1   mirtazapine  (REMERON ) 45 MG tablet Take 1 tablet (45 mg total) by  mouth at bedtime. 90 tablet 0   montelukast  (SINGULAIR ) 10 MG tablet Take 1 tablet (10 mg total) by mouth at bedtime. 90 tablet 2   nebivolol  (BYSTOLIC ) 10 MG  tablet Take 1 tablet (10 mg total) by mouth daily. 90 tablet 3   nitroGLYCERIN  (NITROSTAT ) 0.4 MG SL tablet Place 1 tablet (0.4 mg total) under the tongue every 5 (five) minutes as needed for chest pain. 25 tablet 3   OHTUVAYRE  3 MG/2.5ML SUSP Take 1 vial by nebulization in the morning and at bedtime.     omeprazole  (PRILOSEC) 40 MG capsule Take 1 capsule (40 mg total) by mouth 2 (two) times daily before a meal. NEED APPOINTMENT FOR ADDITIONAL REFILLS 60 capsule 0   OXYGEN  Inhale 4 L/min into the lungs continuous.     ticagrelor  (BRILINTA ) 60 MG TABS tablet Take 1 tablet (60 mg total) by mouth 2 (two) times daily. 180 tablet 2   torsemide  (DEMADEX ) 20 MG tablet Take 1-2 tablets (20-40 mg total) by mouth daily. Take 1 or 2 tablets, depending on lower extremity edema and shortness of breath     varenicline  (CHANTIX ) 1 MG tablet Take 1 tablet (1 mg total) by mouth 2 (two) times daily. 60 tablet 3   Vitamin D , Ergocalciferol , (DRISDOL ) 1.25 MG (50000 UNIT) CAPS capsule Take 1 capsule (50,000 Units total) by mouth every 7 (seven) days. 12 capsule 0   No current facility-administered medications on file prior to visit.  [2]  Allergies Allergen Reactions   Avocado Anaphylaxis   Latex Shortness Of Breath and Rash   Codeine  Nausea Only   "

## 2024-08-19 NOTE — Patient Instructions (Signed)
" °  VISIT SUMMARY: During your visit, we discussed your recent hospitalizations for pneumonia and sepsis, and your ongoing symptoms of coughing and fatigue. We reviewed your current medications and made some adjustments to better manage your conditions. We also addressed your diabetes management, heart health, and new onset of hand tremors. Additionally, we discussed general health maintenance and upcoming appointments.  YOUR PLAN: -ASTHMA-COPD OVERLAP SYNDROME WITH RECENT EXACERBATION: You have a condition that includes both asthma and chronic obstructive pulmonary disease (COPD), which recently worsened due to pneumonia and sepsis. Continue with your current medications: Dupixent  injections every two weeks, Trelegy, albuterol  as needed, DuoNeb as needed for shortness of breath, and Ohtuvayre  twice daily. We have also prescribed Augmentin  for 5 days to help with persistent symptoms.  -TYPE 2 DIABETES MELLITUS WITH OTHER SPECIFIED COMPLICATION: Your blood sugar levels have increased, likely due to recent hospitalizations and prednisone  use. Continue taking metformin  500 mg twice daily with meals and Jardiance . Consider adding Glycine shakes to your diet for nutritional supplementation.  -CHRONIC HEART FAILURE: You have chronic heart failure, which means your heart doesn't pump blood as well as it should. Continue taking torsemide  1.5 tablets daily and Bystolic  10 mg daily. Monitor your weight and fluid status regularly.  -CORONARY ARTERY DISEASE: You have coronary artery disease, which affects the blood flow to your heart. Continue taking atorvastatin  80 mg daily and ezetimibe  10 mg daily. Ensure your nitroglycerin  is up to date and available. Follow up with your cardiologist at the end of the month.  -HYPERLIPIDEMIA: You have high cholesterol levels. Continue taking atorvastatin  80 mg daily and ezetimibe  10 mg daily.  -ACQUIRED HYPOTHYROIDISM: You have an underactive thyroid . We have ordered a thyroid   function test at your next specialist visit to check your levels. Continue taking levothyroxine  as prescribed.  -MAJOR DEPRESSIVE DISORDER AND GENERALIZED ANXIETY DISORDER: You have depression and anxiety, which you are managing well with your current medications. Continue taking your psychiatric medications as prescribed.  -ESSENTIAL TREMOR: You have developed hand tremors, which may be an essential tremor. We have ordered a thyroid  function test to rule out hyperthyroidism and will refer you to a neurologist for further evaluation.  -GENERAL HEALTH MAINTENANCE: You are up to date with your flu vaccine and mammogram, and you have an eye exam scheduled. You need to get your second shingles vaccine. We discussed possible injection site pain and arm soreness with the shingles vaccine. We also encouraged regular foot care and use of lotion for dry skin, and we will schedule a podiatry appointment for foot care.  INSTRUCTIONS: Please follow up with your cardiologist at the end of the month. Ensure your nitroglycerin  is up to date and  available. We will schedule a podiatry appointment for your foot care. Continue monitoring your weight and fluid status regularly. Consider adding Glycine shakes to your diet for nutritional supplementation. Get your second shingles vaccine as discussed.    Contains text generated by [employer].   "

## 2024-08-20 ENCOUNTER — Other Ambulatory Visit (HOSPITAL_COMMUNITY): Payer: Self-pay

## 2024-08-20 ENCOUNTER — Other Ambulatory Visit: Payer: Self-pay

## 2024-08-24 ENCOUNTER — Encounter (HOSPITAL_BASED_OUTPATIENT_CLINIC_OR_DEPARTMENT_OTHER)

## 2024-08-24 ENCOUNTER — Ambulatory Visit (HOSPITAL_BASED_OUTPATIENT_CLINIC_OR_DEPARTMENT_OTHER)

## 2024-09-10 ENCOUNTER — Ambulatory Visit: Admitting: Student in an Organized Health Care Education/Training Program

## 2024-11-18 ENCOUNTER — Ambulatory Visit: Payer: Self-pay | Admitting: Internal Medicine

## 2025-05-06 ENCOUNTER — Inpatient Hospital Stay

## 2025-05-06 ENCOUNTER — Inpatient Hospital Stay: Admitting: Hematology
